# Patient Record
Sex: Female | Born: 1955
Health system: Southern US, Community
[De-identification: ages and names within clinical notes are randomized; demographics above are authoritative.]

## PROBLEM LIST (undated history)

## (undated) DIAGNOSIS — I209 Angina pectoris, unspecified: Secondary | ICD-10-CM

## (undated) DIAGNOSIS — G629 Polyneuropathy, unspecified: Secondary | ICD-10-CM

## (undated) DIAGNOSIS — I219 Acute myocardial infarction, unspecified: Secondary | ICD-10-CM

## (undated) DIAGNOSIS — E785 Hyperlipidemia, unspecified: Secondary | ICD-10-CM

## (undated) DIAGNOSIS — I5032 Chronic diastolic (congestive) heart failure: Secondary | ICD-10-CM

## (undated) DIAGNOSIS — D649 Anemia, unspecified: Secondary | ICD-10-CM

## (undated) DIAGNOSIS — Z972 Presence of dental prosthetic device (complete) (partial): Secondary | ICD-10-CM

## (undated) DIAGNOSIS — Z992 Dependence on renal dialysis: Secondary | ICD-10-CM

## (undated) DIAGNOSIS — E1140 Type 2 diabetes mellitus with diabetic neuropathy, unspecified: Secondary | ICD-10-CM

## (undated) DIAGNOSIS — E119 Type 2 diabetes mellitus without complications: Secondary | ICD-10-CM

## (undated) DIAGNOSIS — N289 Disorder of kidney and ureter, unspecified: Secondary | ICD-10-CM

## (undated) DIAGNOSIS — M1990 Unspecified osteoarthritis, unspecified site: Secondary | ICD-10-CM

## (undated) DIAGNOSIS — N186 End stage renal disease: Secondary | ICD-10-CM

## (undated) DIAGNOSIS — I2510 Atherosclerotic heart disease of native coronary artery without angina pectoris: Secondary | ICD-10-CM

## (undated) DIAGNOSIS — J90 Pleural effusion, not elsewhere classified: Secondary | ICD-10-CM

## (undated) DIAGNOSIS — I10 Essential (primary) hypertension: Secondary | ICD-10-CM

## (undated) DIAGNOSIS — K219 Gastro-esophageal reflux disease without esophagitis: Secondary | ICD-10-CM

## (undated) DIAGNOSIS — I272 Pulmonary hypertension, unspecified: Secondary | ICD-10-CM

## (undated) DIAGNOSIS — E039 Hypothyroidism, unspecified: Secondary | ICD-10-CM

## (undated) HISTORY — DX: End stage renal disease: N18.6

## (undated) HISTORY — DX: Type 2 diabetes mellitus with diabetic neuropathy, unspecified: E11.40

## (undated) HISTORY — DX: Anemia, unspecified: D64.9

## (undated) HISTORY — DX: Atherosclerotic heart disease of native coronary artery without angina pectoris: I25.10

## (undated) HISTORY — DX: Type 2 diabetes mellitus without complications: E11.9

## (undated) HISTORY — PX: CARDIAC CATHETERIZATION: SHX172

## (undated) HISTORY — DX: Essential (primary) hypertension: I10

## (undated) HISTORY — DX: Polyneuropathy, unspecified: G62.9

## (undated) HISTORY — DX: Chronic diastolic (congestive) heart failure: I50.32

## (undated) HISTORY — PX: CATARACT EXTRACTION: SUR2

## (undated) HISTORY — DX: Hyperlipidemia, unspecified: E78.5

## (undated) HISTORY — DX: Hypothyroidism, unspecified: E03.9

## (undated) HISTORY — PX: EYE SURGERY: SHX253

## (undated) SURGERY — Surgical Case
Anesthesia: *Unknown

---
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
## 2005-09-20 ENCOUNTER — Ambulatory Visit: Payer: Self-pay | Admitting: General Practice

## 2011-01-18 ENCOUNTER — Ambulatory Visit: Payer: Self-pay

## 2011-01-29 ENCOUNTER — Ambulatory Visit: Payer: Self-pay | Admitting: Ophthalmology

## 2011-03-05 ENCOUNTER — Ambulatory Visit: Payer: Self-pay | Admitting: Ophthalmology

## 2011-03-06 LAB — HM DIABETES EYE EXAM

## 2012-03-05 DIAGNOSIS — I219 Acute myocardial infarction, unspecified: Secondary | ICD-10-CM

## 2012-03-05 HISTORY — DX: Acute myocardial infarction, unspecified: I21.9

## 2012-04-25 ENCOUNTER — Encounter: Payer: Self-pay | Admitting: Adult Health

## 2012-04-25 ENCOUNTER — Ambulatory Visit (INDEPENDENT_AMBULATORY_CARE_PROVIDER_SITE_OTHER): Payer: BC Managed Care – PPO | Admitting: Adult Health

## 2012-04-25 VITALS — BP 140/90 | HR 110 | Temp 98.3°F | Resp 14 | Ht 63.0 in | Wt 136.0 lb

## 2012-04-25 DIAGNOSIS — I10 Essential (primary) hypertension: Secondary | ICD-10-CM

## 2012-04-25 DIAGNOSIS — G629 Polyneuropathy, unspecified: Secondary | ICD-10-CM

## 2012-04-25 DIAGNOSIS — Z1211 Encounter for screening for malignant neoplasm of colon: Secondary | ICD-10-CM

## 2012-04-25 DIAGNOSIS — Z0000 Encounter for general adult medical examination without abnormal findings: Secondary | ICD-10-CM

## 2012-04-25 DIAGNOSIS — E785 Hyperlipidemia, unspecified: Secondary | ICD-10-CM

## 2012-04-25 DIAGNOSIS — E119 Type 2 diabetes mellitus without complications: Secondary | ICD-10-CM

## 2012-04-25 DIAGNOSIS — E039 Hypothyroidism, unspecified: Secondary | ICD-10-CM

## 2012-04-25 DIAGNOSIS — Z23 Encounter for immunization: Secondary | ICD-10-CM

## 2012-04-25 DIAGNOSIS — G589 Mononeuropathy, unspecified: Secondary | ICD-10-CM

## 2012-04-25 DIAGNOSIS — Z1239 Encounter for other screening for malignant neoplasm of breast: Secondary | ICD-10-CM

## 2012-04-25 MED ORDER — ATENOLOL 25 MG PO TABS: 25.0000 mg | ORAL_TABLET | Freq: Every day | ORAL | Status: DC

## 2012-04-25 NOTE — Patient Instructions (Addendum)
  Thank you for choosing [facility] for your health care needs.  Please return for fasting labs on Monday.   I have restarted you on your blood pressure medication - Take Atenolol 25 mg daily.  Continue to take the aspirin.  We will call you for your appointment for your Mammography and for your Colonoscopy.  We will give you your tetanus vaccine today.  Call and schedule an appointment for your PAP smear.

## 2012-04-25 NOTE — Progress Notes (Signed)
  Subjective:    [name], female    DOB: Jun 13, 1955, 57 y.o.   MRN: [id number]  HPI  Patient is a very pleasant 57 y/o female who is here to establish care. She reports that she has not seen a provider in several years 2/2 loosing her insurance when she lost her job. She has a history of HTN, HLD, hypothyroidism, diabetes. She has not been able to afford any of her medications and, therefore, has not been taking them. Patient has lost 98 lbs since 2008. She cut back on her portions, eliminated many of the snacks she used to eat and began to walk. Patient is feeling well overall. She does report tingling in bilateral lower extremities and pain that mainly occurs at bedtime.  Meds:  ASA 81 mg daily  Health Maintenance:  Eye Exam - 2013, she is s/p bilateral cataract surgery paid for by a [facility] - needs  Colonoscopy - needs screening   Tdap - needs; administer today  Flu shot - does not want  PAP - 2008, normal. Needs to schedule  Dental - No insurance. Lower dentures, partial upper.  Diet - portion control, limits snacks, tries to eat healthy  Exercise - walking    Review of Systems  Constitutional: Negative.   HENT: Negative.   Eyes: Negative.   Respiratory: Negative.   Cardiovascular: Negative.   Gastrointestinal: Negative.   Genitourinary: Negative.   Musculoskeletal: Negative.   Neurological:       Tingling in lower extremities.  Psychiatric/Behavioral: Negative.     BP 140/90  Pulse 110  Temp(Src) 98.3 F (36.8 C) (Oral)  Resp 14  Ht 5\' 3"  (1.6 m)  Wt 136 lb (61.689 kg)  BMI 24.1 kg/m2  SpO2 98%     Objective:   Physical Exam  Constitutional: She is oriented to person, place, and time. She appears well-developed and well-nourished. No distress.  HENT:  Head: Normocephalic and atraumatic.  Right Ear: External ear normal.  Left Ear: External ear normal.  Mouth/Throat: Oropharynx is clear and moist.  Upper teeth poor  condition. She has a partial upper. Lower dentures.  Eyes: Conjunctivae and EOM are normal. Pupils are equal, round, and reactive to light.  Neck: Normal range of motion. Neck supple. No tracheal deviation present. No thyromegaly present.  Cardiovascular: Normal rate, regular rhythm and normal heart sounds.   No murmur heard. Pulmonary/Chest: Effort normal and breath sounds normal. No respiratory distress. She has no wheezes.  Abdominal: Soft. Bowel sounds are normal. She exhibits no mass. There is no tenderness.  Musculoskeletal: Normal range of motion. She exhibits no edema.  Lymphadenopathy:    She has no cervical adenopathy.  Neurological: She is alert and oriented to person, place, and time. No cranial nerve deficit. Coordination normal.  Skin: Skin is warm and dry.  Psychiatric: She has a normal mood and affect. Her behavior is normal. Judgment and thought content normal.          Assessment & Plan:

## 2012-04-27 ENCOUNTER — Encounter: Payer: Self-pay | Admitting: Adult Health

## 2012-04-27 DIAGNOSIS — Z1211 Encounter for screening for malignant neoplasm of colon: Secondary | ICD-10-CM | POA: Insufficient documentation

## 2012-04-27 DIAGNOSIS — E039 Hypothyroidism, unspecified: Secondary | ICD-10-CM | POA: Insufficient documentation

## 2012-04-27 DIAGNOSIS — E119 Type 2 diabetes mellitus without complications: Secondary | ICD-10-CM | POA: Insufficient documentation

## 2012-04-27 DIAGNOSIS — E1122 Type 2 diabetes mellitus with diabetic chronic kidney disease: Secondary | ICD-10-CM | POA: Insufficient documentation

## 2012-04-27 DIAGNOSIS — E785 Hyperlipidemia, unspecified: Secondary | ICD-10-CM | POA: Insufficient documentation

## 2012-04-27 DIAGNOSIS — I10 Essential (primary) hypertension: Secondary | ICD-10-CM | POA: Insufficient documentation

## 2012-04-27 DIAGNOSIS — G629 Polyneuropathy, unspecified: Secondary | ICD-10-CM | POA: Insufficient documentation

## 2012-04-27 DIAGNOSIS — Z23 Encounter for immunization: Secondary | ICD-10-CM | POA: Insufficient documentation

## 2012-04-27 DIAGNOSIS — Z1239 Encounter for other screening for malignant neoplasm of breast: Secondary | ICD-10-CM | POA: Insufficient documentation

## 2012-04-27 NOTE — Assessment & Plan Note (Signed)
Refer to Dr. Tiffany Kocher for screening colonoscopy.

## 2012-04-27 NOTE — Assessment & Plan Note (Signed)
Administer today

## 2012-04-27 NOTE — Assessment & Plan Note (Addendum)
Order mammogram °

## 2012-04-27 NOTE — Assessment & Plan Note (Signed)
Patient has not been on medication for her diabetes. She lost her job and her insurance so she was not able to afford it. She lost 98 lbs and I suspect that this may have positively affected her glucose control. Will check labs.

## 2012-04-27 NOTE — Assessment & Plan Note (Signed)
Hx of hyperlipidemia. Patient has lost a considerable amount of weight. Will check lipids.

## 2012-04-27 NOTE — Assessment & Plan Note (Signed)
Has been ordered synthroid in the past. She is currently not taking. Will check TSH

## 2012-04-27 NOTE — Assessment & Plan Note (Signed)
Patient with reports of tingling and pain in lower extremities mainly occuring at night. Hx of DM. Will check labs - hgbA1C, B12, TSH

## 2012-04-27 NOTE — Assessment & Plan Note (Signed)
Will restart her atenolol. Continue daily asa 81 mg. Patient has lost 98 lbs which, I suspect, has impacted her b/p readings positively.

## 2012-04-28 ENCOUNTER — Other Ambulatory Visit (INDEPENDENT_AMBULATORY_CARE_PROVIDER_SITE_OTHER): Payer: BC Managed Care – PPO

## 2012-04-28 ENCOUNTER — Other Ambulatory Visit: Payer: Self-pay | Admitting: Adult Health

## 2012-04-28 DIAGNOSIS — G629 Polyneuropathy, unspecified: Secondary | ICD-10-CM

## 2012-04-28 DIAGNOSIS — G589 Mononeuropathy, unspecified: Secondary | ICD-10-CM

## 2012-04-28 DIAGNOSIS — E119 Type 2 diabetes mellitus without complications: Secondary | ICD-10-CM

## 2012-04-28 DIAGNOSIS — E039 Hypothyroidism, unspecified: Secondary | ICD-10-CM

## 2012-04-28 DIAGNOSIS — Z0000 Encounter for general adult medical examination without abnormal findings: Secondary | ICD-10-CM

## 2012-04-28 LAB — COMPREHENSIVE METABOLIC PANEL
ALT: 16 U/L (ref 0–35)
AST: 14 U/L (ref 0–37)
Albumin: 3.7 g/dL (ref 3.5–5.2)
Alkaline Phosphatase: 40 U/L (ref 39–117)
BUN: 21 mg/dL (ref 6–23)
CO2: 28 mEq/L (ref 19–32)
Calcium: 9.7 mg/dL (ref 8.4–10.5)
Chloride: 100 mEq/L (ref 96–112)
Creatinine, Ser: 0.9 mg/dL (ref 0.4–1.2)
GFR: 66.55 mL/min (ref >60.00)
Glucose, Bld: 337 mg/dL — ABNORMAL HIGH (ref 70–99)
Potassium: 4.8 mEq/L (ref 3.5–5.1)
Sodium: 136 mEq/L (ref 135–145)
Total Bilirubin: 0.6 mg/dL (ref 0.3–1.2)
Total Protein: 6.9 g/dL (ref 6.0–8.3)

## 2012-04-28 LAB — LDL CHOLESTEROL, DIRECT: Direct LDL: 206.9 mg/dL

## 2012-04-28 LAB — CBC WITH DIFFERENTIAL/PLATELET
Basophils Absolute: 0 10*3/uL (ref 0.0–0.1)
Basophils Relative: 1 % (ref 0.0–3.0)
Eosinophils Absolute: 0.1 10*3/uL (ref 0.0–0.7)
Eosinophils Relative: 1.8 % (ref 0.0–5.0)
HCT: 39 % (ref 36.0–46.0)
Hemoglobin: 13.6 g/dL (ref 12.0–15.0)
Lymphocytes Relative: 33.5 % (ref 12.0–46.0)
Lymphs Abs: 1.6 10*3/uL (ref 0.7–4.0)
MCHC: 34.8 g/dL (ref 30.0–36.0)
MCV: 84.7 fl (ref 78.0–100.0)
Monocytes Absolute: 0.3 10*3/uL (ref 0.1–1.0)
Monocytes Relative: 5.7 % (ref 3.0–12.0)
Neutro Abs: 2.8 10*3/uL (ref 1.4–7.7)
Neutrophils Relative %: 58 % (ref 43.0–77.0)
Platelets: 229 10*3/uL (ref 150.0–400.0)
RBC: 4.6 Mil/uL (ref 3.87–5.11)
RDW: 12.9 % (ref 11.5–14.6)
WBC: 4.8 10*3/uL (ref 4.5–10.5)

## 2012-04-28 LAB — LIPID PANEL
Cholesterol: 330 mg/dL — ABNORMAL HIGH (ref 0–200)
HDL: 57.6 mg/dL (ref >39.00)
Total CHOL/HDL Ratio: 6
Triglycerides: 139 mg/dL (ref 0.0–149.0)
VLDL: 27.8 mg/dL (ref 0.0–40.0)

## 2012-04-28 LAB — HEMOGLOBIN A1C: Hgb A1c MFr Bld: 12.3 % — ABNORMAL HIGH (ref 4.6–6.5)

## 2012-04-28 LAB — TSH: TSH: 2.36 u[IU]/mL (ref 0.35–5.50)

## 2012-04-28 LAB — VITAMIN B12: Vitamin B-12: 604 pg/mL (ref 211–911)

## 2012-04-28 MED ORDER — METFORMIN HCL 500 MG PO TABS: 500.0000 mg | ORAL_TABLET | Freq: Two times a day (BID) | ORAL | Status: DC

## 2012-04-28 MED ORDER — SIMVASTATIN 10 MG PO TABS: 10.0000 mg | ORAL_TABLET | Freq: Every day | ORAL | Status: DC

## 2012-04-28 MED ORDER — INSULIN DETEMIR 100 UNIT/ML ~~LOC~~ SOLN: 10.0000 [IU] | Freq: Every day | SUBCUTANEOUS | Status: DC

## 2012-04-28 NOTE — Progress Notes (Signed)
  Start Levemir 10 units at bedtime (I have given you samples)  Start metformin 500 mg twice daily.  Check  blood sugar twice a day. Check it in the morning prior to eating anything and then check it again 1 hour after lunch or dinner.  Please keep a detailed record of your blood sugar so that I can make adjustments when I see you in 2 weeks.  Please return to clinic for follow up of diabetes in 2 weeks.

## 2012-04-29 LAB — MICROALBUMIN, URINE: Microalb, Ur: 169.84 mg/dL — ABNORMAL HIGH (ref 0.00–1.89)

## 2012-04-30 ENCOUNTER — Telehealth: Payer: Self-pay | Admitting: Adult Health

## 2012-04-30 ENCOUNTER — Other Ambulatory Visit: Payer: Self-pay | Admitting: Adult Health

## 2012-04-30 DIAGNOSIS — Z79899 Other long term (current) drug therapy: Secondary | ICD-10-CM

## 2012-04-30 MED ORDER — LISINOPRIL 10 MG PO TABS: 10.0000 mg | ORAL_TABLET | Freq: Every day | ORAL | Status: DC

## 2012-04-30 NOTE — Telephone Encounter (Signed)
Patient needing test strips and lancets called into the pharmacy.

## 2012-04-30 NOTE — Telephone Encounter (Signed)
Called patients husband regarding test strips and he is unsure of what brand his wife uses.  He will call back tomorrow.

## 2012-04-30 NOTE — Telephone Encounter (Signed)
Spouse called back wanting to get his wifes lancets and test strips called in to [facility] on [address] Please advise when these have been called it.  Mr Sherian Maroon number is [phone]   Ms shiu stated that his wife is completely out of these  Mr Georgia Lopes also stated he called yesterday and 3 times today trying to get this rx sent in

## 2012-05-01 ENCOUNTER — Telehealth: Payer: Self-pay | Admitting: Adult Health

## 2012-05-01 ENCOUNTER — Other Ambulatory Visit: Payer: Self-pay | Admitting: Adult Health

## 2012-05-01 NOTE — Telephone Encounter (Signed)
Patient has questions about her medication.

## 2012-05-01 NOTE — Telephone Encounter (Signed)
Jamie Arias, Please call in prescription for [facility] Freedom strips and lancets.  Let the pharmacist know that pt is checking her blood glucose 4 times daily and if they could please provide her with enough for a month with 4 refills.

## 2012-05-01 NOTE — Telephone Encounter (Signed)
Patient stated that she did not have a question about "one particular med" she took all her medications at once on a empty stomach and figured out that's why  she was feeling nauseated. She stated that she will not do that again.  Script left on [facility] vm

## 2012-05-01 NOTE — Telephone Encounter (Addendum)
Test strips and lancets called into pharmacy.

## 2012-05-02 ENCOUNTER — Telehealth: Payer: Self-pay | Admitting: Adult Health

## 2012-05-02 NOTE — Telephone Encounter (Signed)
Can the patient take a laxative and if so what kind of laxative?

## 2012-05-02 NOTE — Telephone Encounter (Signed)
Jamie Arias,  Are there any restrictions on Jamie Arias taking otc laxatives and to which ones I can recommend?    Jamie Arias

## 2012-05-02 NOTE — Telephone Encounter (Signed)
Patient's husband called and said that they would get the otc laxative.

## 2012-05-06 ENCOUNTER — Telehealth: Payer: Self-pay | Admitting: *Deleted

## 2012-05-06 ENCOUNTER — Telehealth: Payer: Self-pay | Admitting: Adult Health

## 2012-05-06 NOTE — Telephone Encounter (Signed)
Opened in error

## 2012-05-06 NOTE — Telephone Encounter (Signed)
Spoke with Mrs. Casaus and informed her that prior authorizations can sometimes be lengthy and suggested for her appointment with our NP this coming Friday that I would give one of our meters and ask [facility] to right a script for test strips that hopefully should be less expensive

## 2012-05-06 NOTE — Telephone Encounter (Signed)
Spouse states Jamie Arias has not been able to get her test strips from [facility].  [facility] says they faxed something over to [place] regarding the test strips; spouse thinks it had to do with insurance issue.  Jamie Arias has an appt on Friday with Raquel and is supposed to be able to give her report on sugars but pt is not able to test sugars due to not having test strips.  Pt is upset and frustrated.  Please advise.

## 2012-05-06 NOTE — Telephone Encounter (Signed)
Called [phone] for prior authorization for the Freestyle strips, form is being faxed over now

## 2012-05-09 ENCOUNTER — Ambulatory Visit: Payer: BC Managed Care – PPO | Admitting: Adult Health

## 2012-05-12 ENCOUNTER — Other Ambulatory Visit: Payer: Self-pay | Admitting: Adult Health

## 2012-05-12 ENCOUNTER — Telehealth: Payer: Self-pay | Admitting: *Deleted

## 2012-05-12 MED ORDER — GLUCOSE BLOOD VI STRP: ORAL_STRIP | Status: DC

## 2012-05-12 NOTE — Telephone Encounter (Signed)
Jamie Arias the patient's Contour test scripts can not be called in. They need to be on a hard copy with the dx code and how many times a day she should check it. Could you write it up and I will fax it to their pharmacist

## 2012-05-12 NOTE — Telephone Encounter (Signed)
Per [facility] patient spoke with Larene Beach and will be receiving new meter and samples of Levimer today. He will be here to pick up samples and glucometer

## 2012-05-12 NOTE — Telephone Encounter (Signed)
I have sent in a hard copy to pharmacy with diagnosis code.

## 2012-05-12 NOTE — Telephone Encounter (Signed)
Returned patients call regarding refill;msg.left

## 2012-05-14 ENCOUNTER — Other Ambulatory Visit: Payer: Self-pay | Admitting: Adult Health

## 2012-05-14 ENCOUNTER — Telehealth: Payer: Self-pay | Admitting: General Practice

## 2012-05-14 MED ORDER — TRAZODONE HCL 100 MG PO TABS: 100.0000 mg | ORAL_TABLET | Freq: Every day | ORAL | Status: DC

## 2012-05-14 NOTE — Telephone Encounter (Signed)
Pt husband called stating pt had been given a sample of insulin from [facility]. States that she is out and would like an Rx or more samples. Please advise.

## 2012-05-14 NOTE — Telephone Encounter (Signed)
Jamie Arias,  Disregard the previous message. I spoke with patient and she said she had the samples.  Thx, Eimy Plaza

## 2012-05-14 NOTE — Telephone Encounter (Signed)
We gave the patient 2 samples of Levemir the other day. Did they not pick it up? We had it in the refrigerator with her name on it??

## 2012-05-23 ENCOUNTER — Encounter: Payer: Self-pay | Admitting: Adult Health

## 2012-05-23 ENCOUNTER — Ambulatory Visit (INDEPENDENT_AMBULATORY_CARE_PROVIDER_SITE_OTHER): Payer: BC Managed Care – PPO | Admitting: Adult Health

## 2012-05-23 VITALS — BP 164/84 | HR 56 | Temp 98.0°F | Resp 14 | Ht 63.5 in | Wt 139.0 lb

## 2012-05-23 DIAGNOSIS — E119 Type 2 diabetes mellitus without complications: Secondary | ICD-10-CM

## 2012-05-23 DIAGNOSIS — I10 Essential (primary) hypertension: Secondary | ICD-10-CM

## 2012-05-23 DIAGNOSIS — G629 Polyneuropathy, unspecified: Secondary | ICD-10-CM

## 2012-05-23 DIAGNOSIS — G479 Sleep disorder, unspecified: Secondary | ICD-10-CM

## 2012-05-23 DIAGNOSIS — G589 Mononeuropathy, unspecified: Secondary | ICD-10-CM

## 2012-05-23 DIAGNOSIS — Z79899 Other long term (current) drug therapy: Secondary | ICD-10-CM

## 2012-05-23 LAB — CREATININE, SERUM: Creatinine, Ser: 1 mg/dL (ref 0.4–1.2)

## 2012-05-23 LAB — POTASSIUM: Potassium: 5.1 mEq/L (ref 3.5–5.1)

## 2012-05-23 MED ORDER — HYDROCHLOROTHIAZIDE 12.5 MG PO TABS: 12.5000 mg | ORAL_TABLET | Freq: Every day | ORAL | Status: DC

## 2012-05-23 MED ORDER — GABAPENTIN 300 MG PO CAPS: ORAL_CAPSULE | ORAL | Status: DC

## 2012-05-23 MED ORDER — ZOLPIDEM TARTRATE 5 MG PO TABS: 5.0000 mg | ORAL_TABLET | Freq: Every evening | ORAL | Status: DC | PRN

## 2012-05-23 MED ORDER — ATENOLOL 25 MG PO TABS: 25.0000 mg | ORAL_TABLET | Freq: Every day | ORAL | Status: DC

## 2012-05-23 MED ORDER — LISINOPRIL 10 MG PO TABS: 10.0000 mg | ORAL_TABLET | Freq: Every day | ORAL | Status: DC

## 2012-05-23 NOTE — Assessment & Plan Note (Signed)
Patient has been experiencing "pins and needles" feeling in her lower extremities. These are causing her significant discomfort and dark interfering with her sleep. Prior to establishing care in this office, patient had been off of her diabetic medications for extended period of time after she lost her job and insurance.  I suspect this is diabetic neuropathy. Start Neurontin 300 mg daily. We will increase gradually as needed. Return for followup in one month.

## 2012-05-23 NOTE — Patient Instructions (Addendum)
  You are doing well. Continue to good work.  I have ordered neurontin for your "pins & needles" pain in your feet. Take this at bedtime.  Take the ambien at bedtime to help you sleep.  When you pick up your medication at [facility], please verify that it is the correct medications and dosage.

## 2012-05-23 NOTE — Assessment & Plan Note (Signed)
During last clinic visit, patient's blood sugar was greater than 300. Patient reports that her blood sugars are running less than 150. She has a record of it each reading, however, she forgot to bring them in. She reports that she is feeling so much better since she started back on medication to control her blood sugar. She is hoping to be able to regulate her blood sugar with oral medication and eventually stop the Levemir. Patient is experiencing neuropathic pain in bilateral lower extremities. I had checked her B12 levels during last visit and these were normal.

## 2012-05-23 NOTE — Assessment & Plan Note (Signed)
Patient's blood pressure is slightly elevated during visit today. In reviewing her medications, patient does not recall being on hydrochlorothiazide and has not been taking this. I have provided her a list of her blood pressure medications including atenolol, lisinopril, hydrochlorothiazide. I have asked her to return for followup in one month for recheck of her blood pressure.

## 2012-05-23 NOTE — Assessment & Plan Note (Signed)
Patient has been taking trazodone but she reports that this is not helping her stay asleep. Start Ambien 5 mg at bedtime when necessary

## 2012-05-23 NOTE — Progress Notes (Signed)
  Subjective:    [name], female    DOB: [birth year], 57 y.o.   MRN: [id number]  HPI  Patient is a very pleasant 57 y/o female who is here for f/u of her HTN and diabetes. She also reports continued tingling in bilateral lower extremities and pain "that feel like needles pricking me". Patient reports feeling much better since starting back on her medications. She is checking her blood sugars 4 times daily and keeping a record. She unfortunately has forgotten to bring her blood sugar readings with her. Patient also reports that she is having trouble sleeping. She states that she is currently taking trazodone but finds that she awakens around 3 AM and is unable to go back to sleep.   Current Outpatient Prescriptions on File Prior to Visit  Medication Sig Dispense Refill  . BAYER ASPIRIN EC LOW DOSE PO Take 81 mg by mouth daily.      Marland Kitchen glucose blood test strip Please dispense Contour next EZ test strips. She is testing 4 times a day. Diagnosis code 250.0  120 each  12  . insulin detemir (LEVEMIR) 100 UNIT/ML injection Inject 10 Units into the skin at bedtime.  10 mL  12  . levothyroxine (SYNTHROID, LEVOTHROID) 50 MCG tablet Take 50 mcg by mouth daily.      . metFORMIN (GLUCOPHAGE) 500 MG tablet Take 1 tablet (500 mg total) by mouth 2 (two) times daily with a meal.  60 tablet  3  . simvastatin (ZOCOR) 10 MG tablet Take 1 tablet (10 mg total) by mouth at bedtime.  30 tablet  3   No current facility-administered medications on file prior to visit.     Review of Systems  Constitutional: Negative for fever, chills, appetite change and fatigue.  HENT: Negative.   Eyes: Negative.   Respiratory: Negative.   Cardiovascular: Negative.   Endocrine: Negative.   Genitourinary: Negative.   Musculoskeletal: Negative.   Skin: Negative.   Allergic/Immunologic: Negative.   Neurological: Positive for numbness. Negative for dizziness, light-headedness and headaches.       Tingling in  bilateral LE  Hematological: Negative.   Psychiatric/Behavioral: Negative.     BP 164/84  Pulse 56  Temp(Src) 98 F (36.7 C) (Oral)  Resp 14  Ht 5' 3.5" (1.613 m)  Wt 139 lb (63.05 kg)  BMI 24.23 kg/m2  SpO2 100%     Objective:   Physical Exam  Constitutional: She is oriented to person, place, and time. She appears well-developed and well-nourished. No distress.  HENT:  Head: Normocephalic and atraumatic.  Cardiovascular: Normal rate, regular rhythm, normal heart sounds and intact distal pulses.  Exam reveals no gallop.   No murmur heard. Pulmonary/Chest: Effort normal and breath sounds normal. No respiratory distress. She has no wheezes. She has no rales.  Abdominal: Soft. Bowel sounds are normal.  Musculoskeletal: She exhibits no edema.  Neurological: She is alert and oriented to person, place, and time.  Skin: Skin is warm and dry.  Psychiatric: She has a normal mood and affect. Her behavior is normal. Judgment and thought content normal.       Assessment & Plan:

## 2012-06-09 ENCOUNTER — Telehealth: Payer: Self-pay | Admitting: Adult Health

## 2012-06-09 NOTE — Telephone Encounter (Signed)
insulin detemir (LEVEMIR) 100 UNIT/ML injection

## 2012-06-11 NOTE — Telephone Encounter (Signed)
Appt made for pt on Friday. And Levemir samples in the fridge for pt.

## 2012-06-13 ENCOUNTER — Ambulatory Visit: Payer: BC Managed Care – PPO | Admitting: Adult Health

## 2012-06-16 ENCOUNTER — Telehealth: Payer: Self-pay | Admitting: Adult Health

## 2012-06-16 NOTE — Telephone Encounter (Signed)
Please schedule appt for patient to be evaluated.

## 2012-06-16 NOTE — Telephone Encounter (Signed)
Patient has some concerns about her medication they are making her nauseated . Please call the patient after 5:00.

## 2012-06-17 NOTE — Telephone Encounter (Signed)
Called patient, husband said pt. Only available after 5:00, can maybe reach her on her cell phone after 12:00. Will CB

## 2012-06-18 NOTE — Telephone Encounter (Signed)
Called and left VM for pt to call and schedule an appointment to discuss medications.

## 2012-06-25 ENCOUNTER — Telehealth: Payer: Self-pay | Admitting: *Deleted

## 2012-06-25 NOTE — Telephone Encounter (Signed)
Phone call from pt's husband, OV scheduled 07/04/12, requesting sample of Levemir. Sample left for pick up.

## 2012-07-04 ENCOUNTER — Encounter: Payer: Self-pay | Admitting: Adult Health

## 2012-07-04 ENCOUNTER — Ambulatory Visit (INDEPENDENT_AMBULATORY_CARE_PROVIDER_SITE_OTHER): Payer: BC Managed Care – PPO | Admitting: Adult Health

## 2012-07-04 VITALS — BP 156/68 | HR 70 | Temp 98.3°F | Resp 12 | Wt 141.0 lb

## 2012-07-04 DIAGNOSIS — E119 Type 2 diabetes mellitus without complications: Secondary | ICD-10-CM

## 2012-07-04 DIAGNOSIS — R899 Unspecified abnormal finding in specimens from other organs, systems and tissues: Secondary | ICD-10-CM

## 2012-07-04 DIAGNOSIS — I10 Essential (primary) hypertension: Secondary | ICD-10-CM

## 2012-07-04 DIAGNOSIS — G589 Mononeuropathy, unspecified: Secondary | ICD-10-CM

## 2012-07-04 DIAGNOSIS — R6889 Other general symptoms and signs: Secondary | ICD-10-CM

## 2012-07-04 DIAGNOSIS — G629 Polyneuropathy, unspecified: Secondary | ICD-10-CM

## 2012-07-04 DIAGNOSIS — E785 Hyperlipidemia, unspecified: Secondary | ICD-10-CM

## 2012-07-04 MED ORDER — INSULIN PEN NEEDLE 32G X 6 MM MISC: Status: DC

## 2012-07-04 NOTE — Progress Notes (Signed)
Subjective:    [name], female    DOB: Jul 30, 1955, 57 y.o.   MRN: [id number]  HPI  It is a pleasant 57 year old female with history of hypertension, hyperlipidemia, diabetes type 2 with neuropathy, hypothyroidism who presents to clinic for follow up of diabetes, hypertension and neuropathy.  1) Diabetes - patient reports that her a.m. blood sugars are running in the low 100. This morning her blood sugar was 98. She is taking metformin 500 mg twice a day and she is on Levemir 10 units at bedtime. She denies any episodes of hypoglycemia. Patient is currently checking her blood sugars twice a day. She reports checking it first thing in the morning and then one hour after meals rather than before meals. Her p.m. blood glucose is running anywhere between 140 and 160.  2) hypertension - patient has not been monitoring this at home as she does not have a blood pressure cuff. Blood pressure during visit is 156/68. She is currently on atenolol, lisinopril and hydrochlorothiazide.  3) neuropathy - patient feels that the gabapentin has been helping. The feelings of "pins and needles" in lower extremities is somewhat improved.   Current Outpatient Prescriptions on File Prior to Visit  Medication Sig Dispense Refill  . atenolol (TENORMIN) 25 MG tablet Take 1 tablet (25 mg total) by mouth daily.  30 tablet  6  . BAYER ASPIRIN EC LOW DOSE PO Take 81 mg by mouth daily.      Marland Kitchen gabapentin (NEURONTIN) 300 MG capsule Take 1 capsule in the evening.  60 capsule  2  . glucose blood test strip Please dispense Contour next EZ test strips. She is testing 4 times a day. Diagnosis code 250.0  120 each  12  . hydrochlorothiazide (HYDRODIURIL) 12.5 MG tablet Take 1 tablet (12.5 mg total) by mouth daily.  90 tablet  3  . insulin detemir (LEVEMIR) 100 UNIT/ML injection Inject 10 Units into the skin at bedtime.  10 mL  12  . levothyroxine (SYNTHROID, LEVOTHROID) 50 MCG tablet Take 50 mcg by mouth daily.       Marland Kitchen lisinopril (PRINIVIL,ZESTRIL) 10 MG tablet Take 1 tablet (10 mg total) by mouth daily.  30 tablet  3  . metFORMIN (GLUCOPHAGE) 500 MG tablet Take 1 tablet (500 mg total) by mouth 2 (two) times daily with a meal.  60 tablet  3  . simvastatin (ZOCOR) 10 MG tablet Take 1 tablet (10 mg total) by mouth at bedtime.  30 tablet  3  . zolpidem (AMBIEN) 5 MG tablet Take 1 tablet (5 mg total) by mouth at bedtime as needed for sleep.  15 tablet  1   No current facility-administered medications on file prior to visit.    Past Medical History  Diagnosis Date  . Diabetes mellitus without complication   . Hypertension   . Hyperlipidemia   . Hypothyroidism   . Diabetic neuropathy     Past Surgical History  Procedure Laterality Date  . Eye surgery      Cataract both eyes    Family History  Problem Relation Age of Onset  . COPD Mother   . Cancer Mother     Lung  . Pulmonary embolism Father     History   Social History  . Marital Status: Married    Spouse Name: N/A    Number of Children: N/A  . Years of Education: N/A   Occupational History  . Not on file.   Social History Main Topics  .  Smoking status: Never Smoker   . Smokeless tobacco: Never Used  . Alcohol Use: No  . Drug Use: No  . Sexually Active: Not on file   Other Topics Concern  . Not on file   Social History Narrative   Patient lives at home with her husband. Patient has 4 adult children.    Review of Systems  Constitutional: Negative.   HENT: Negative.   Eyes: Negative.   Respiratory: Negative.   Cardiovascular: Negative.   Gastrointestinal: Positive for nausea.       Occasional nausea when taking metformin. Patient reports that this does not happen daily.  Endocrine: Negative.   Genitourinary: Negative.   Allergic/Immunologic: Negative.   Neurological: Negative for dizziness, tremors, syncope and weakness.       Numbness and tingling bilateral lower extremities but these have improved   Psychiatric/Behavioral: Positive for sleep disturbance. Negative for behavioral problems, confusion, decreased concentration and agitation. The patient is not nervous/anxious.        Sleeping better with Ambien. Reports some stressors related to personal and work issues. She finds reading a distraction and does this often.   BP 156/68  Pulse 70  Temp(Src) 98.3 F (36.8 C)  Resp 12  Wt 141 lb (63.957 kg)  BMI 24.58 kg/m2  SpO2 97%    Objective:   Physical Exam  Constitutional: She is oriented to person, place, and time.  Pleasant female in no apparent distress.  HENT:  Head: Normocephalic and atraumatic.  Cardiovascular: Normal rate, regular rhythm, normal heart sounds and intact distal pulses.  Exam reveals no gallop and no friction rub.   No murmur heard. Pulmonary/Chest: Effort normal and breath sounds normal. No respiratory distress. She has no wheezes. She has no rales. She exhibits no tenderness.  Abdominal: Soft. Bowel sounds are normal.  Neurological: She is alert and oriented to person, place, and time.  Psychiatric: She has a normal mood and affect. Her behavior is normal. Judgment and thought content normal.          Assessment & Plan:

## 2012-07-04 NOTE — Patient Instructions (Addendum)
  You are doing well. I am pleased with your blood sugar numbers.  Return for labs on 07/25/12. You will need to be fasting.  Call to schedule your mammogram and also your preliminary appointment for your colonoscopy.  I will need to see you for a physical including your PAP

## 2012-07-04 NOTE — Assessment & Plan Note (Signed)
Started Neurontin 300 mg daily. This seems to be helping with her symptoms. No change in dose at this time. Continue to monitor.

## 2012-07-04 NOTE — Assessment & Plan Note (Signed)
Blood pressure improved since last visit. Last visit blood pressure was 164/84. Today it is 156/68. There may still be element of white coat syndrome. Patient had not been taking her hydrochlorothiazide when I saw her last visit. She is now which reflects in her better readings. She has no way of checking her blood pressure at home as she does not have a blood pressure cuff. Would like to get systolic below [id number]. Continue to follow.

## 2012-07-04 NOTE — Assessment & Plan Note (Addendum)
Blood sugars have greatly improved on metformin and Levemir. Her a.m. readings are between 98-118. She had been checking her blood sugar one hour after meals in the evening and they have been running between 140 and 163. I have instructed her to test her blood sugar prior to meals. Repeat hemoglobin A1c at the end of May. Consider switching to oral hypoglycemic agents if A1c improved. Patient has appointment May 23 for repeat lab work.

## 2012-07-16 ENCOUNTER — Encounter: Payer: Self-pay | Admitting: Adult Health

## 2012-07-16 ENCOUNTER — Ambulatory Visit (INDEPENDENT_AMBULATORY_CARE_PROVIDER_SITE_OTHER): Payer: BC Managed Care – PPO | Admitting: Adult Health

## 2012-07-16 VITALS — BP 106/58 | HR 56 | Temp 98.2°F | Resp 16 | Wt 137.0 lb

## 2012-07-16 DIAGNOSIS — R55 Syncope and collapse: Secondary | ICD-10-CM | POA: Insufficient documentation

## 2012-07-16 DIAGNOSIS — R42 Dizziness and giddiness: Secondary | ICD-10-CM

## 2012-07-16 DIAGNOSIS — G479 Sleep disorder, unspecified: Secondary | ICD-10-CM

## 2012-07-16 LAB — CBC WITH DIFFERENTIAL/PLATELET
Basophils Absolute: 0.1 10*3/uL (ref 0.0–0.1)
Basophils Relative: 0.9 % (ref 0.0–3.0)
Eosinophils Absolute: 0.1 10*3/uL (ref 0.0–0.7)
Eosinophils Relative: 0.9 % (ref 0.0–5.0)
HCT: 36.5 % (ref 36.0–46.0)
Hemoglobin: 12.6 g/dL (ref 12.0–15.0)
Lymphocytes Relative: 37.7 % (ref 12.0–46.0)
Lymphs Abs: 2.3 10*3/uL (ref 0.7–4.0)
MCHC: 34.5 g/dL (ref 30.0–36.0)
MCV: 86.7 fl (ref 78.0–100.0)
Monocytes Absolute: 0.3 10*3/uL (ref 0.1–1.0)
Monocytes Relative: 5.1 % (ref 3.0–12.0)
Neutro Abs: 3.4 10*3/uL (ref 1.4–7.7)
Neutrophils Relative %: 55.4 % (ref 43.0–77.0)
Platelets: 225 10*3/uL (ref 150.0–400.0)
RBC: 4.21 Mil/uL (ref 3.87–5.11)
RDW: 13.8 % (ref 11.5–14.6)
WBC: 6.2 10*3/uL (ref 4.5–10.5)

## 2012-07-16 LAB — BASIC METABOLIC PANEL
BUN: 24 mg/dL — ABNORMAL HIGH (ref 6–23)
CO2: 28 mEq/L (ref 19–32)
Calcium: 9.9 mg/dL (ref 8.4–10.5)
Chloride: 105 mEq/L (ref 96–112)
Creatinine, Ser: 0.9 mg/dL (ref 0.4–1.2)
GFR: 70 mL/min (ref >60.00)
Glucose, Bld: 174 mg/dL — ABNORMAL HIGH (ref 70–99)
Potassium: 5.2 mEq/L — ABNORMAL HIGH (ref 3.5–5.1)
Sodium: 140 mEq/L (ref 135–145)

## 2012-07-16 MED ORDER — TRAZODONE HCL 100 MG PO TABS: 100.0000 mg | ORAL_TABLET | Freq: Every day | ORAL | Status: DC

## 2012-07-16 NOTE — Progress Notes (Signed)
Subjective:    [name], female    DOB: April 15, 1955, 57 y.o.   MRN: [id number]  HPI  He should is a pleasant 57 year old female with history of hypertension, diabetes with neuropathy, hypothyroidism who presents to clinic with report of episodes of feeling like she is going to pass out. Episodes have mainly occurred during work. She works in a [employer] where she is standing for 9 hours. She reports that it is often very hot inside the facility. Patient reports that she begins to feel lightheaded, sick and feeling like she is going to faint. Symptoms have not occurred to her while sitting. She has not been able to check her blood sugar during working hours. She has not checked her pulse during these episodes. A.m. blood sugars have been running around 115. She denies chest pain or shortness of breath during episodes. Patient is on Levemir at bedtime and she is also on metformin. She reports that she is not eating regularly. She is skipping some meals.   Current Outpatient Prescriptions on File Prior to Visit  Medication Sig Dispense Refill  . atenolol (TENORMIN) 25 MG tablet Take 1 tablet (25 mg total) by mouth daily.  30 tablet  6  . BAYER ASPIRIN EC LOW DOSE PO Take 81 mg by mouth daily.      Marland Kitchen gabapentin (NEURONTIN) 300 MG capsule Take 1 capsule in the evening.  60 capsule  2  . glucose blood test strip Please dispense Contour next EZ test strips. She is testing 4 times a day. Diagnosis code 250.0  120 each  12  . hydrochlorothiazide (HYDRODIURIL) 12.5 MG tablet Take 1 tablet (12.5 mg total) by mouth daily.  90 tablet  3  . insulin detemir (LEVEMIR) 100 UNIT/ML injection Inject 10 Units into the skin at bedtime.  10 mL  12  . Insulin Pen Needle 32G X 6 MM MISC Dispense 1 box of 50 insulin pen needles.  50 each  0  . levothyroxine (SYNTHROID, LEVOTHROID) 50 MCG tablet Take 50 mcg by mouth daily.      Marland Kitchen lisinopril (PRINIVIL,ZESTRIL) 10 MG tablet Take 1 tablet (10 mg total) by mouth  daily.  30 tablet  3  . metFORMIN (GLUCOPHAGE) 500 MG tablet Take 1 tablet (500 mg total) by mouth 2 (two) times daily with a meal.  60 tablet  3  . simvastatin (ZOCOR) 10 MG tablet Take 1 tablet (10 mg total) by mouth at bedtime.  30 tablet  3   No current facility-administered medications on file prior to visit.    Review of Systems  Constitutional: Negative for fever, chills and fatigue.  Respiratory: Negative for cough, chest tightness, shortness of breath and wheezing.   Cardiovascular: Negative for chest pain and leg swelling.       Bradycardia heart rate 56. Patient is on atenolol 25 mg  Neurological: Positive for syncope and light-headedness. Negative for dizziness, tremors, weakness, numbness and headaches.  Psychiatric/Behavioral: Positive for sleep disturbance.       Ambien is not working     BP 106/58  Pulse 56  Temp(Src) 98.2 F (36.8 C) (Oral)  Resp 16  Wt 137 lb (62.143 kg)  BMI 23.88 kg/m2  SpO2 99%    Objective:   Physical Exam  Constitutional: She is oriented to person, place, and time. She appears well-developed and well-nourished. No distress.  Cardiovascular: Regular rhythm, normal heart sounds and intact distal pulses.   Bradycardia, heart rate 56  Pulmonary/Chest: Effort normal.  No respiratory distress.  Musculoskeletal: She exhibits no edema.  Neurological: She is alert and oriented to person, place, and time. No cranial nerve deficit. Coordination normal.  Skin: Skin is warm and dry.  Psychiatric: She has a normal mood and affect. Her behavior is normal. Judgment and thought content normal.          Assessment & Plan:

## 2012-07-16 NOTE — Patient Instructions (Addendum)
  You need to eat every 2-3 hours. You are taking insulin which will cause your blood sugar to drop.  Drink fluids (water, diet drinks) throughout the day to stay hydrated especially in very hot weather.  Please check your blood sugar 4 times daily, before meals and at bedtime for the next 1-2 weeks.  Check your blood sugar and your pulse if any of these events occur again.  Follow up in 1-2 weeks.

## 2012-07-16 NOTE — Assessment & Plan Note (Addendum)
Given patient's report, suspect that these episodes are related to hypoglycemia. Patient has been skipping meals and going extended periods of time without food. Explained need for meals/snacks every 2-3 hours. I have provided Jamie Arias with a diabetic meal plan. Had a long discussion with patient regarding the possibility that symptoms could be attributed to different issues. She was comfortable with the first trying to evaluate if eating more regularly would improve Jamie Arias symptoms. EKG was done and was normal except for bradycardia. Patient is on atenolol. Jamie Arias symptoms may also be coming from this. She is going to monitor Jamie Arias pulse when and if these episodes reappear. Check CBC and metabolic panel today. Followup in one to 2 weeks. If patient is still having symptoms will continue further workup. Note, greater than 30 minutes were spent in face-to-face communication with patient in the assessment and development of a plan of care.

## 2012-07-16 NOTE — Assessment & Plan Note (Signed)
DC Ambien. Restart trazodone 100 mg at bedtime

## 2012-07-21 ENCOUNTER — Ambulatory Visit (INDEPENDENT_AMBULATORY_CARE_PROVIDER_SITE_OTHER): Payer: BC Managed Care – PPO | Admitting: Adult Health

## 2012-07-21 ENCOUNTER — Encounter: Payer: Self-pay | Admitting: Adult Health

## 2012-07-21 VITALS — BP 110/66 | HR 72 | Resp 12 | Wt 133.0 lb

## 2012-07-21 DIAGNOSIS — E875 Hyperkalemia: Secondary | ICD-10-CM

## 2012-07-21 DIAGNOSIS — R55 Syncope and collapse: Secondary | ICD-10-CM

## 2012-07-21 DIAGNOSIS — E119 Type 2 diabetes mellitus without complications: Secondary | ICD-10-CM

## 2012-07-21 DIAGNOSIS — E860 Dehydration: Secondary | ICD-10-CM

## 2012-07-21 NOTE — Patient Instructions (Addendum)
  Stop the atenolol. This may be lowering your pulse.  Drink water - at least 3 bottles (16 oz) daily.  Please have blood work drawn prior to leaving the office.  Return on Friday for fasting blood work.  I am referring you to Cardiology.  I will also refer you for Diabetic education.

## 2012-07-21 NOTE — Progress Notes (Signed)
  Subjective:    [name], female    DOB: [birth year], 57 y.o.   MRN: [id number]  HPI  Patient presents to clinic with continued feelings of weakness and that she is going to pass out. She was seen in clinic a week ago with the same symptoms. She has been monitoring her BG and it has been between 130s and 140s. No episodes of hypoglycemia. She has also been checking her pulse and it has been high 50s and low 60s. Patient is on atenolol 25 mg daily. She denies chest pain or shortness of breath.    Current Outpatient Prescriptions on File Prior to Visit  Medication Sig Dispense Refill  . BAYER ASPIRIN EC LOW DOSE PO Take 81 mg by mouth daily.      Marland Kitchen gabapentin (NEURONTIN) 300 MG capsule Take 1 capsule in the evening.  60 capsule  2  . glucose blood test strip Please dispense Contour next EZ test strips. She is testing 4 times a day. Diagnosis code 250.0  120 each  12  . hydrochlorothiazide (HYDRODIURIL) 12.5 MG tablet Take 1 tablet (12.5 mg total) by mouth daily.  90 tablet  3  . insulin detemir (LEVEMIR) 100 UNIT/ML injection Inject 10 Units into the skin at bedtime.  10 mL  12  . Insulin Pen Needle 32G X 6 MM MISC Dispense 1 box of 50 insulin pen needles.  50 each  0  . levothyroxine (SYNTHROID, LEVOTHROID) 50 MCG tablet Take 50 mcg by mouth daily.      Marland Kitchen lisinopril (PRINIVIL,ZESTRIL) 10 MG tablet Take 1 tablet (10 mg total) by mouth daily.  30 tablet  3  . metFORMIN (GLUCOPHAGE) 500 MG tablet Take 1 tablet (500 mg total) by mouth 2 (two) times daily with a meal.  60 tablet  3  . simvastatin (ZOCOR) 10 MG tablet Take 1 tablet (10 mg total) by mouth at bedtime.  30 tablet  3  . traZODone (DESYREL) 100 MG tablet Take 1 tablet (100 mg total) by mouth at bedtime.  30 tablet  6   No current facility-administered medications on file prior to visit.    Review of Systems  Neurological: Positive for weakness and light-headedness. Negative for dizziness.       Near syncope   BP  110/66  Pulse 72  Resp 12  Wt 133 lb (60.328 kg)  BMI 23.19 kg/m2  SpO2 98%    Objective:   Physical Exam  Constitutional: She is oriented to person, place, and time. She appears well-developed and well-nourished. No distress.  Cardiovascular: Normal rate, regular rhythm and normal heart sounds.  Exam reveals no gallop and no friction rub.   No murmur heard. Pulmonary/Chest: Effort normal and breath sounds normal. No respiratory distress. She has no wheezes. She has no rales.  Musculoskeletal: She exhibits no edema.  Neurological: She is alert and oriented to person, place, and time.  Skin: Skin is warm and dry.  Psychiatric: She has a normal mood and affect. Her behavior is normal. Judgment and thought content normal.       Assessment & Plan:

## 2012-07-21 NOTE — Assessment & Plan Note (Addendum)
Continues with symptoms of weakness and near syncope. Stop atenolol as I suspect her HR may be dropping and causing symptoms. Recheck potassium, BUN/Cr. Recently check TSH and was wnl. Patient needs to drink fluids. Refer to cardiology.

## 2012-07-22 ENCOUNTER — Encounter: Payer: Self-pay | Admitting: Emergency Medicine

## 2012-07-22 LAB — CREATININE, SERUM: Creatinine, Ser: 0.9 mg/dL (ref 0.4–1.2)

## 2012-07-22 LAB — POTASSIUM: Potassium: 4.8 mEq/L (ref 3.5–5.1)

## 2012-07-22 LAB — BUN: BUN: 26 mg/dL — ABNORMAL HIGH (ref 6–23)

## 2012-07-25 ENCOUNTER — Encounter: Payer: Self-pay | Admitting: Adult Health

## 2012-07-25 ENCOUNTER — Ambulatory Visit (INDEPENDENT_AMBULATORY_CARE_PROVIDER_SITE_OTHER): Payer: BC Managed Care – PPO | Admitting: Adult Health

## 2012-07-25 DIAGNOSIS — E785 Hyperlipidemia, unspecified: Secondary | ICD-10-CM

## 2012-07-25 DIAGNOSIS — R6889 Other general symptoms and signs: Secondary | ICD-10-CM

## 2012-07-25 DIAGNOSIS — R899 Unspecified abnormal finding in specimens from other organs, systems and tissues: Secondary | ICD-10-CM

## 2012-07-25 DIAGNOSIS — E119 Type 2 diabetes mellitus without complications: Secondary | ICD-10-CM

## 2012-07-25 LAB — COMPREHENSIVE METABOLIC PANEL
ALT: 13 U/L (ref 0–35)
AST: 14 U/L (ref 0–37)
Albumin: 3.9 g/dL (ref 3.5–5.2)
Alkaline Phosphatase: 34 U/L — ABNORMAL LOW (ref 39–117)
BUN: 23 mg/dL (ref 6–23)
CO2: 27 mEq/L (ref 19–32)
Calcium: 9.8 mg/dL (ref 8.4–10.5)
Chloride: 100 mEq/L (ref 96–112)
Creatinine, Ser: 0.9 mg/dL (ref 0.4–1.2)
GFR: 68.2 mL/min (ref >60.00)
Glucose, Bld: 169 mg/dL — ABNORMAL HIGH (ref 70–99)
Potassium: 4.3 mEq/L (ref 3.5–5.1)
Sodium: 136 mEq/L (ref 135–145)
Total Bilirubin: 0.6 mg/dL (ref 0.3–1.2)
Total Protein: 6.9 g/dL (ref 6.0–8.3)

## 2012-07-25 LAB — LIPID PANEL
Cholesterol: 268 mg/dL — ABNORMAL HIGH (ref 0–200)
HDL: 59.2 mg/dL (ref >39.00)
Total CHOL/HDL Ratio: 5
Triglycerides: 115 mg/dL (ref 0.0–149.0)
VLDL: 23 mg/dL (ref 0.0–40.0)

## 2012-07-25 LAB — HEMOGLOBIN A1C: Hgb A1c MFr Bld: 8.3 % — ABNORMAL HIGH (ref 4.6–6.5)

## 2012-07-25 LAB — LDL CHOLESTEROL, DIRECT: Direct LDL: 168.1 mg/dL

## 2012-07-26 LAB — MICROALBUMIN, URINE: Microalb, Ur: 41.58 mg/dL — ABNORMAL HIGH (ref 0.00–1.89)

## 2012-07-29 ENCOUNTER — Telehealth: Payer: Self-pay | Admitting: *Deleted

## 2012-07-29 NOTE — Progress Notes (Signed)
[name], female   DOB: 11/03/1955, 57 y.o.   MRN: [id number]  Lab appointment only

## 2012-07-29 NOTE — Telephone Encounter (Signed)
Pt called stating she had 2 episodes of the near syncope over the weekend, one of which occurred in the middle of the night when she got up. Pt states she gets up slowly to avoid dizziness. Heart rate was in the 60s with each episode.  Fasting blood glucose levels have been in the 150s and 160s. Pt states she is afraid to go to work for fear she will pass out on the job. Spoke with Gabriel Cirri at [place]  to get an earlier appointment than 08/11/12, but no availability, was placed on wait list. Pt would like to see Raquel again this week to be evaluated. Pt states she stopped her Atenolol as directed and has increased her fluid intake. Appt scheduled 07/31/12. Advised to call back with a worsening or change in symptoms.  Pt verbalized understanding.

## 2012-07-31 ENCOUNTER — Ambulatory Visit: Payer: BC Managed Care – PPO | Admitting: Adult Health

## 2012-08-01 ENCOUNTER — Ambulatory Visit (INDEPENDENT_AMBULATORY_CARE_PROVIDER_SITE_OTHER): Payer: BC Managed Care – PPO | Admitting: Adult Health

## 2012-08-01 ENCOUNTER — Encounter: Payer: Self-pay | Admitting: Adult Health

## 2012-08-01 VITALS — BP 108/68 | HR 88 | Resp 12 | Wt 136.0 lb

## 2012-08-01 DIAGNOSIS — E785 Hyperlipidemia, unspecified: Secondary | ICD-10-CM

## 2012-08-01 DIAGNOSIS — E119 Type 2 diabetes mellitus without complications: Secondary | ICD-10-CM

## 2012-08-01 DIAGNOSIS — R55 Syncope and collapse: Secondary | ICD-10-CM

## 2012-08-01 NOTE — Progress Notes (Signed)
Subjective:    [name], female    DOB: 11-21-1955, 57 y.o.   MRN: [id number]  HPI  Patient is a pleasant 57 y/o female with history of diabetes with neuropathy, hypertension, hyperlipidemia, hypothyroidism who presents for f/u near syncopal episodes and review of her recent lab work. Atenolol was recently discontinued as it was suspected that her near  syncopal episodes may have been precipitated by a dropping of her HR. She was seen 1 week ago and reported feeling much better; however, pt had another episode earlier this week. She reports she was at her grandson's ball game and it was very hot. She feels this may have caused her most recent episode. She denied chest pain or shortness of breath. Patient reports that her blood sugar was in the 140s one episode occurred. As soon as she cooled off and had something to drink the episode subsided. Today she reports feeling much better. No other episodes. Patient has appointment with cardiologist in approximately one week.   Current Outpatient Prescriptions on File Prior to Visit  Medication Sig Dispense Refill  . BAYER ASPIRIN EC LOW DOSE PO Take 81 mg by mouth daily.      Marland Kitchen gabapentin (NEURONTIN) 300 MG capsule Take 1 capsule in the evening.  60 capsule  2  . glucose blood test strip Please dispense Contour next EZ test strips. She is testing 4 times a day. Diagnosis code 250.0  120 each  12  . hydrochlorothiazide (HYDRODIURIL) 12.5 MG tablet Take 1 tablet (12.5 mg total) by mouth daily.  90 tablet  3  . insulin detemir (LEVEMIR) 100 UNIT/ML injection Inject 10 Units into the skin at bedtime.  10 mL  12  . Insulin Pen Needle 32G X 6 MM MISC Dispense 1 box of 50 insulin pen needles.  50 each  0  . levothyroxine (SYNTHROID, LEVOTHROID) 50 MCG tablet Take 50 mcg by mouth daily.      Marland Kitchen lisinopril (PRINIVIL,ZESTRIL) 10 MG tablet Take 1 tablet (10 mg total) by mouth daily.  30 tablet  3  . metFORMIN (GLUCOPHAGE) 500 MG tablet Take 1 tablet  (500 mg total) by mouth 2 (two) times daily with a meal.  60 tablet  3  . simvastatin (ZOCOR) 10 MG tablet Take 1 tablet (10 mg total) by mouth at bedtime.  30 tablet  3  . traZODone (DESYREL) 100 MG tablet Take 1 tablet (100 mg total) by mouth at bedtime.  30 tablet  6   No current facility-administered medications on file prior to visit.    Review of Systems  Constitutional: Negative.   Respiratory: Negative.   Cardiovascular: Negative.   Gastrointestinal: Negative for nausea, vomiting, diarrhea and constipation.  Endocrine: Negative.   Genitourinary: Negative.   Neurological: Negative for dizziness, syncope, weakness, light-headedness and headaches.  Psychiatric/Behavioral: Negative for behavioral problems, confusion and agitation. The patient is not nervous/anxious.     BP 108/68  Pulse 88  Resp 12  Wt 136 lb (61.689 kg)  BMI 23.71 kg/m2  SpO2 99%    Objective:   Physical Exam  Constitutional: She is oriented to person, place, and time.  Pleasant female in no apparent distress  HENT:  Head: Normocephalic and atraumatic.  Cardiovascular: Normal rate, regular rhythm and normal heart sounds.  Exam reveals no gallop.   No murmur heard. Pulmonary/Chest: Effort normal and breath sounds normal. No respiratory distress. She has no wheezes. She has no rales.  Abdominal: Soft. Bowel sounds are normal.  Musculoskeletal: She exhibits no edema.  Neurological: She is alert and oriented to person, place, and time.  Skin: Skin is warm and dry.  Psychiatric: She has a normal mood and affect. Her behavior is normal. Judgment and thought content normal.       Assessment & Plan:

## 2012-08-03 ENCOUNTER — Encounter: Payer: Self-pay | Admitting: Adult Health

## 2012-08-03 NOTE — Assessment & Plan Note (Signed)
Cholesterol also improving with medication. She will be seeing a nutritionist which I anticipate will help her further. Also consider increasing cholesterol medication; however, will wait until patient sees cardiology. Given patient's recent symptoms of syncope I do not want to make medication changes until this has been evaluated further. Followup after cardiology appointment.

## 2012-08-03 NOTE — Assessment & Plan Note (Signed)
Patient felt improved after we stopped the atenolol. I suspected that her heart rate was dropping which was contributing to her near syncope episodes. She did report having one other episode this week in the setting of extreme heat. She has appointment with cardiology in approximately one week to evaluate this further. Encouraged her to drink fluid especially when temperatures increased.

## 2012-08-03 NOTE — Patient Instructions (Signed)
  Your hemoglobin A1c and cholesterol have improved.  You are doing very well.  You have been referred to a nutritionist. The nutritionist will help you with your diet to further control your diabetes and cholesterol.  Remember to drink fluids to stay hydrated.  Please make a followup appointment after you have seen cardiologist.

## 2012-08-03 NOTE — Assessment & Plan Note (Addendum)
Hemoglobin A1c has gone from 12.3 to 8.3 in 3 months. Microalbumin has gone from 169.84 to 41.58. Continue lisinopril. Patient is compliant with her medications. She is having some difficulty with diet. Referred to nutritionist.

## 2012-08-08 ENCOUNTER — Ambulatory Visit: Payer: BC Managed Care – PPO | Admitting: Adult Health

## 2012-08-11 ENCOUNTER — Ambulatory Visit (INDEPENDENT_AMBULATORY_CARE_PROVIDER_SITE_OTHER): Payer: BC Managed Care – PPO | Admitting: Cardiovascular Disease

## 2012-08-11 ENCOUNTER — Telehealth: Payer: Self-pay | Admitting: Adult Health

## 2012-08-11 ENCOUNTER — Encounter: Payer: Self-pay | Admitting: Cardiovascular Disease

## 2012-08-11 VITALS — BP 115/75 | HR 137 | Ht 63.0 in | Wt 133.8 lb

## 2012-08-11 DIAGNOSIS — I10 Essential (primary) hypertension: Secondary | ICD-10-CM

## 2012-08-11 DIAGNOSIS — E785 Hyperlipidemia, unspecified: Secondary | ICD-10-CM

## 2012-08-11 DIAGNOSIS — R42 Dizziness and giddiness: Secondary | ICD-10-CM

## 2012-08-11 DIAGNOSIS — R55 Syncope and collapse: Secondary | ICD-10-CM

## 2012-08-11 MED ORDER — METOPROLOL SUCCINATE ER 25 MG PO TB24: 25.0000 mg | ORAL_TABLET | Freq: Every day | ORAL | Status: DC

## 2012-08-11 NOTE — Assessment & Plan Note (Signed)
His blood pressure starts increasing after stopping hydrochlorothiazide, the dose of lisinopril can be increased.

## 2012-08-11 NOTE — Patient Instructions (Addendum)
Stop taking Hydrochlorothiazide.  Start Metoprolol ER 25 mg once daily.   Your physician has requested that you have an echocardiogram. Echocardiography is a painless test that uses sound waves to create images of your heart. It provides your doctor with information about the size and shape of your heart and how well your heart's chambers and valves are working. This procedure takes approximately one hour. There are no restrictions for this procedure.  Follow up after echo.

## 2012-08-11 NOTE — Assessment & Plan Note (Signed)
I suspect that her symptoms are likely related to orthostatic hypotension as well as autonomic dysfunction worsened by some of her medications. She was orthostatic today with a drop of systolic blood pressure from 159 to 115. Resting heart rate increased from 106 to 137. She felt dizzy and nauseous with this. I recommend stopping hydrochlorothiazide which might be worsening her orthostatic hypotension. I advised her to increase her fluid intake. She did have bradycardia while she was on atenolol. However, she is not tachycardic and seems to symptomatic with this. Thus, I will start her on small dose metoprolol extended release 25 mg once daily. I also think that the trazodone is causing some of her symptoms. This should be changed to another medication. I will obtain an echocardiogram to make sure she does not have any structural heart abnormalities. I will have her followup with me after echocardiogram to make further adjustments in her medications.

## 2012-08-11 NOTE — Assessment & Plan Note (Signed)
Lab Results  Component Value Date   CHOL 268* 07/25/2012   HDL 59.20 07/25/2012   LDLDIRECT 168.1 07/25/2012   TRIG 115.0 07/25/2012   CHOLHDL 5 07/25/2012   I agree with treatment with simvastatin given her history of diabetes.

## 2012-08-11 NOTE — Telephone Encounter (Signed)
Pt spouse dropped off forms to be filled out  In box

## 2012-08-11 NOTE — Telephone Encounter (Signed)
Forms given to Raquel to complete.

## 2012-08-11 NOTE — Progress Notes (Signed)
HPI  This is a 57 year old female who was referred by Charolette Forward for evaluation of dizziness and presyncope. The patient has no previous cardiac history. She has known history of diabetes, hypertension and hyperlipidemia. She was without medications for a while but resumed medical treatment early this year. Since then, she has noticed frequent episodes of dizziness and presyncopal episodes. This can happen with changing position or sometimes in a standing position. She was on atenolol 25 mg once daily and was noted to be bradycardic. This was stopped but she continued to have the symptoms. She has noticed some of these episodes after she took trazodone for insomnia. She is also a small dose hydrochlorothiazide. She denies any chest discomfort or dyspnea. She appears to be very anxious. She had labs done recently which over all unremarkable including thyroid function. BUN was at the upper limits of normal.  No Known Allergies   Current Outpatient Prescriptions on File Prior to Visit  Medication Sig Dispense Refill  . BAYER ASPIRIN EC LOW DOSE PO Take 81 mg by mouth daily.      Marland Kitchen gabapentin (NEURONTIN) 300 MG capsule Take 1 capsule in the evening.  60 capsule  2  . glucose blood test strip Please dispense Contour next EZ test strips. She is testing 4 times a day. Diagnosis code 250.0  120 each  12  . hydrochlorothiazide (HYDRODIURIL) 12.5 MG tablet Take 1 tablet (12.5 mg total) by mouth daily.  90 tablet  3  . insulin detemir (LEVEMIR) 100 UNIT/ML injection Inject 10 Units into the skin at bedtime.  10 mL  12  . Insulin Pen Needle 32G X 6 MM MISC Dispense 1 box of 50 insulin pen needles.  50 each  0  . levothyroxine (SYNTHROID, LEVOTHROID) 50 MCG tablet Take 50 mcg by mouth daily.      Marland Kitchen lisinopril (PRINIVIL,ZESTRIL) 10 MG tablet Take 1 tablet (10 mg total) by mouth daily.  30 tablet  3  . metFORMIN (GLUCOPHAGE) 500 MG tablet Take 1 tablet (500 mg total) by mouth 2 (two) times daily with a meal.   60 tablet  3  . simvastatin (ZOCOR) 10 MG tablet Take 1 tablet (10 mg total) by mouth at bedtime.  30 tablet  3  . traZODone (DESYREL) 100 MG tablet Take 1 tablet (100 mg total) by mouth at bedtime.  30 tablet  6   No current facility-administered medications on file prior to visit.     Past Medical History  Diagnosis Date  . Diabetes mellitus without complication   . Hypertension   . Hyperlipidemia   . Hypothyroidism   . Diabetic neuropathy      Past Surgical History  Procedure Laterality Date  . Eye surgery      Cataract both eyes     Family History  Problem Relation Age of Onset  . COPD Mother   . Cancer Mother     Lung  . Pulmonary embolism Father      History   Social History  . Marital Status: Married    Spouse Name: N/A    Number of Children: N/A  . Years of Education: N/A   Occupational History  . Not on file.   Social History Main Topics  . Smoking status: Never Smoker   . Smokeless tobacco: Never Used  . Alcohol Use: No  . Drug Use: No  . Sexually Active: Not on file   Other Topics Concern  . Not on file  Social History Narrative   Patient lives at home with her husband. Patient has 4 adult children.     ROS Constitutional: Negative for fever, chills, diaphoresis, activity change, appetite change and fatigue.  HENT: Negative for hearing loss, nosebleeds, congestion, sore throat, facial swelling, drooling, trouble swallowing, neck pain, voice change, sinus pressure and tinnitus.  Eyes: Negative for photophobia, pain, discharge and visual disturbance.  Respiratory: Negative for apnea, cough, chest tightness, shortness of breath and wheezing.  Cardiovascular: Negative for chest pain and leg swelling.  Gastrointestinal: Negative for nausea, vomiting, abdominal pain, diarrhea, constipation, blood in stool and abdominal distention.  Genitourinary: Negative for dysuria, urgency, frequency, hematuria and decreased urine volume.  Musculoskeletal:  Negative for myalgias, back pain, joint swelling, arthralgias and gait problem.  Skin: Negative for color change, pallor, rash and wound.  Neurological: Negative for tremors, seizures, syncope, speech difficulty, weakness, light-headedness, numbness and headaches.  Psychiatric/Behavioral: Negative for suicidal ideas, hallucinations, behavioral problems and agitation. The patient is  nervous/anxious.     PHYSICAL EXAM   BP 115/75  Pulse 137  Ht 5\' 3"  (1.6 m)  Wt 133 lb 12 oz (60.669 kg)  BMI 23.7 kg/m2 Constitutional: She is oriented to person, place, and time. She appears well-developed and well-nourished. No distress.  HENT: No nasal discharge.  Head: Normocephalic and atraumatic.  Eyes: Pupils are equal and round. Right eye exhibits no discharge. Left eye exhibits no discharge.  Neck: Normal range of motion. Neck supple. No JVD present. No thyromegaly present.  Cardiovascular: Tachycardic, regular rhythm, normal heart sounds. Exam reveals no gallop and no friction rub. No murmur heard.  Pulmonary/Chest: Effort normal and breath sounds normal. No stridor. No respiratory distress. She has no wheezes. She has no rales. She exhibits no tenderness.  Abdominal: Soft. Bowel sounds are normal. She exhibits no distension. There is no tenderness. There is no rebound and no guarding.  Musculoskeletal: Normal range of motion. She exhibits no edema and no tenderness.  Neurological: She is alert and oriented to person, place, and time. Coordination normal.  Skin: Skin is warm and dry. No rash noted. She is not diaphoretic. No erythema. No pallor.  Psychiatric: She has a normal mood and affect. Her behavior is normal. Judgment and thought content normal.     EKG: Sinus  Tachycardia  -Left atrial enlargement.   BORDERLINE    ASSESSMENT AND PLAN

## 2012-08-12 ENCOUNTER — Other Ambulatory Visit: Payer: Self-pay

## 2012-08-12 ENCOUNTER — Other Ambulatory Visit (INDEPENDENT_AMBULATORY_CARE_PROVIDER_SITE_OTHER): Payer: BC Managed Care – PPO

## 2012-08-12 DIAGNOSIS — R55 Syncope and collapse: Secondary | ICD-10-CM

## 2012-08-19 ENCOUNTER — Telehealth: Payer: Self-pay | Admitting: Adult Health

## 2012-08-19 NOTE — Telephone Encounter (Signed)
Refaxed [employer] form with completed date.

## 2012-08-19 NOTE — Telephone Encounter (Signed)
Patient called stating she is having some issues with some disability papers. [employer] is stating that the first date of disability was not filled out correctly. Please call the patient directly to discuss.

## 2012-08-19 NOTE — Telephone Encounter (Signed)
Already spoke with pt, waiting on fax to clarify what is needed.

## 2012-08-19 NOTE — Telephone Encounter (Signed)
Jacqlyn Larsen can you call [name] and find out specifics. Thanks

## 2012-08-22 ENCOUNTER — Encounter: Payer: Self-pay | Admitting: Cardiovascular Disease

## 2012-08-22 ENCOUNTER — Ambulatory Visit (INDEPENDENT_AMBULATORY_CARE_PROVIDER_SITE_OTHER): Payer: BC Managed Care – PPO | Admitting: Cardiovascular Disease

## 2012-08-22 VITALS — BP 162/86 | HR 82 | Ht 63.0 in | Wt 135.0 lb

## 2012-08-22 DIAGNOSIS — E785 Hyperlipidemia, unspecified: Secondary | ICD-10-CM

## 2012-08-22 DIAGNOSIS — R55 Syncope and collapse: Secondary | ICD-10-CM

## 2012-08-22 DIAGNOSIS — I10 Essential (primary) hypertension: Secondary | ICD-10-CM

## 2012-08-22 MED ORDER — LISINOPRIL 40 MG PO TABS: 40.0000 mg | ORAL_TABLET | Freq: Every day | ORAL | Status: DC

## 2012-08-22 NOTE — Patient Instructions (Addendum)
Increase Lisinopril 40 mg once daily.  Continue other medications.  Follow up as needed.

## 2012-08-22 NOTE — Assessment & Plan Note (Signed)
Her blood pressure is now elevated after stopping hydrochlorothiazide. I will go ahead and increase the dose of lisinopril to 40 mg once daily. If blood pressure remains elevated, the dose of metoprolol can be increased if she is not bradycardic or amlodipine can be added.

## 2012-08-22 NOTE — Progress Notes (Signed)
HPI  This is a 57 year old female who is here today for followup regarding  dizziness and presyncope. The patient has no previous cardiac history. She has known history of diabetes, hypertension and hyperlipidemia. She was without medications for a while but resumed medical treatment early this year. Since then, she had frequent episodes of dizziness and presyncopal episodes.  She was on atenolol 25 mg once daily and was noted to be bradycardic. This was stopped but she continued to have the symptoms.  During last visit, she was noted to be significantly orthostatic with sinus tachycardia at baseline and a heart rate of 137 beats per minute. I stop hydrochlorothiazide and trazodone. I started her on small dose metoprolol. She reports complete resolution of her dizziness. Echocardiogram was overall unremarkable except for mild diastolic dysfunction.  No Known Allergies   Current Outpatient Prescriptions on File Prior to Visit  Medication Sig Dispense Refill  . BAYER ASPIRIN EC LOW DOSE PO Take 81 mg by mouth daily.      Marland Kitchen gabapentin (NEURONTIN) 300 MG capsule Take 1 capsule in the evening.  60 capsule  2  . glucose blood test strip Please dispense Contour next EZ test strips. She is testing 4 times a day. Diagnosis code 250.0  120 each  12  . insulin detemir (LEVEMIR) 100 UNIT/ML injection Inject 10 Units into the skin at bedtime.  10 mL  12  . Insulin Pen Needle 32G X 6 MM MISC Dispense 1 box of 50 insulin pen needles.  50 each  0  . levothyroxine (SYNTHROID, LEVOTHROID) 50 MCG tablet Take 50 mcg by mouth daily.      Marland Kitchen lisinopril (PRINIVIL,ZESTRIL) 10 MG tablet Take 1 tablet (10 mg total) by mouth daily.  30 tablet  3  . metFORMIN (GLUCOPHAGE) 500 MG tablet Take 1 tablet (500 mg total) by mouth 2 (two) times daily with a meal.  60 tablet  3  . metoprolol succinate (TOPROL XL) 25 MG 24 hr tablet Take 1 tablet (25 mg total) by mouth daily.  30 tablet  6  . simvastatin (ZOCOR) 10 MG tablet Take  1 tablet (10 mg total) by mouth at bedtime.  30 tablet  3   No current facility-administered medications on file prior to visit.     Past Medical History  Diagnosis Date  . Diabetes mellitus without complication   . Hypertension   . Hyperlipidemia   . Hypothyroidism   . Diabetic neuropathy      Past Surgical History  Procedure Laterality Date  . Eye surgery      Cataract both eyes     Family History  Problem Relation Age of Onset  . COPD Mother   . Cancer Mother     Lung  . Pulmonary embolism Father      History   Social History  . Marital Status: Married    Spouse Name: N/A    Number of Children: N/A  . Years of Education: N/A   Occupational History  . Not on file.   Social History Main Topics  . Smoking status: Never Smoker   . Smokeless tobacco: Never Used  . Alcohol Use: No  . Drug Use: No  . Sexually Active: Not on file   Other Topics Concern  . Not on file   Social History Narrative   Patient lives at home with her husband. Patient has 4 adult children.      PHYSICAL EXAM   BP 162/86  Pulse  82  Ht 5\' 3"  (1.6 m)  Wt 135 lb (61.236 kg)  BMI 23.92 kg/m2 Constitutional: She is oriented to person, place, and time. She appears well-developed and well-nourished. No distress.  HENT: No nasal discharge.  Head: Normocephalic and atraumatic.  Eyes: Pupils are equal and round. Right eye exhibits no discharge. Left eye exhibits no discharge.  Neck: Normal range of motion. Neck supple. No JVD present. No thyromegaly present.  Cardiovascular: Normal rate, regular rhythm, normal heart sounds. Exam reveals no gallop and no friction rub. No murmur heard.  Pulmonary/Chest: Effort normal and breath sounds normal. No stridor. No respiratory distress. She has no wheezes. She has no rales. She exhibits no tenderness.  Abdominal: Soft. Bowel sounds are normal. She exhibits no distension. There is no tenderness. There is no rebound and no guarding.    Musculoskeletal: Normal range of motion. She exhibits no edema and no tenderness.  Neurological: She is alert and oriented to person, place, and time. Coordination normal.  Skin: Skin is warm and dry. No rash noted. She is not diaphoretic. No erythema. No pallor.  Psychiatric: She has a normal mood and affect. Her behavior is normal. Judgment and thought content normal.     EKG: Sinus  Rhythm  Low voltage in precordial leads.   ABNORMAL     ASSESSMENT AND PLAN

## 2012-08-22 NOTE — Assessment & Plan Note (Signed)
This was due to orthostatic hypotension. Her symptoms resolved completely after stopping hydrochlorothiazide and trazodone as well as adding small dose metoprolol. Echocardiogram was overall unremarkable. Followup as needed.

## 2012-08-25 ENCOUNTER — Encounter: Payer: Self-pay | Admitting: Adult Health

## 2012-08-25 ENCOUNTER — Ambulatory Visit (INDEPENDENT_AMBULATORY_CARE_PROVIDER_SITE_OTHER): Payer: BC Managed Care – PPO | Admitting: Adult Health

## 2012-08-25 VITALS — BP 132/70 | HR 79 | Resp 12 | Wt 135.5 lb

## 2012-08-25 DIAGNOSIS — I10 Essential (primary) hypertension: Secondary | ICD-10-CM

## 2012-08-25 DIAGNOSIS — R42 Dizziness and giddiness: Secondary | ICD-10-CM

## 2012-08-25 MED ORDER — PREDNISONE (PAK) 10 MG PO TABS: 10.0000 mg | ORAL_TABLET | Freq: Every day | ORAL | Status: DC

## 2012-08-25 MED ORDER — DIAZEPAM 5 MG PO TABS: 5.0000 mg | ORAL_TABLET | Freq: Three times a day (TID) | ORAL | Status: DC | PRN

## 2012-08-25 NOTE — Patient Instructions (Addendum)
  Start Nasonex 2 sprays into each nostril once a day.  Start prednisone taper today. You will take 6 tablets today and decrease by one tablet daily until completed. Take this medication with food.  Prednisone will increase your blood sugar. Continue to monitor your blood sugar and increase Levemir by 2-4 units as needed.  Also take Valium 5 mg every 8 hours as needed for dizziness.  If your symptoms are not improved after this treatment, I will send you for CT of the head.  Please call or send message through Mychart with any questions or concerns.

## 2012-08-25 NOTE — Assessment & Plan Note (Signed)
Patient started Lisinopril 40 mg yesterday. Follow up in 2 weeks for blood work (bmp).

## 2012-08-25 NOTE — Assessment & Plan Note (Signed)
Patient is s/p cardiac w/u for her symptoms.  ? Inner ear as cause for her symptoms of dizziness (labryinthitis). Will treat with prednisone taper, nasonex nasal spray 2 per nostril and valium 5 mg q 8 hours prn for dizziness. Instructed to monitor blood glucose and adjust Levemir if glucose elevated. If symptoms do not improve with this treatment will send for head CT.

## 2012-08-25 NOTE — Progress Notes (Signed)
Subjective:    [name], female    DOB: 12/28/1955, 57 y.o.   MRN: [id number]  HPI  Ms. Deruyter is here for followup after cardiology consult and medication changes as well as followup for her presyncope. Her symptoms improved temporarily; however, she reports symptoms reoccurring for the last 2-3 days. She has started her metoprolol without any incident. Patient's lisinopril was increased by Dr. Fletcher Anon to 40 mg daily. She started the increased dose yesterday. She reports having an episode of presyncope on Saturday. She says she went to stand up and felt lightheaded. She is also experiencing these symptoms when she blows her nose. She clarifies that her symptoms are not necessarily that she is going to pass out, but more so that everything begins to spin. She experienced another episode yesterday after leaving a restaurant.   Current Outpatient Prescriptions on File Prior to Visit  Medication Sig Dispense Refill  . BAYER ASPIRIN EC LOW DOSE PO Take 81 mg by mouth daily.      Marland Kitchen gabapentin (NEURONTIN) 300 MG capsule Take 1 capsule in the evening.  60 capsule  2  . glucose blood test strip Please dispense Contour next EZ test strips. She is testing 4 times a day. Diagnosis code 250.0  120 each  12  . insulin detemir (LEVEMIR) 100 UNIT/ML injection Inject 10 Units into the skin at bedtime.  10 mL  12  . Insulin Pen Needle 32G X 6 MM MISC Dispense 1 box of 50 insulin pen needles.  50 each  0  . levothyroxine (SYNTHROID, LEVOTHROID) 50 MCG tablet Take 50 mcg by mouth daily.      Marland Kitchen lisinopril (PRINIVIL,ZESTRIL) 40 MG tablet Take 1 tablet (40 mg total) by mouth daily.  30 tablet  6  . metFORMIN (GLUCOPHAGE) 500 MG tablet Take 1 tablet (500 mg total) by mouth 2 (two) times daily with a meal.  60 tablet  3  . metoprolol succinate (TOPROL XL) 25 MG 24 hr tablet Take 1 tablet (25 mg total) by mouth daily.  30 tablet  6  . simvastatin (ZOCOR) 10 MG tablet Take 1 tablet (10 mg total) by mouth at  bedtime.  30 tablet  3   No current facility-administered medications on file prior to visit.    Review of Systems  Constitutional: Negative for fever and chills.  HENT: Positive for postnasal drip and sinus pressure. Negative for nosebleeds, congestion and tinnitus.   Respiratory: Negative.   Cardiovascular: Negative.   Neurological: Positive for dizziness. Negative for tremors, syncope, weakness, numbness and headaches.  Psychiatric/Behavioral: Negative.        Objective:   Physical Exam  Constitutional: She is oriented to person, place, and time. She appears well-developed and well-nourished. No distress.  HENT:  Head: Normocephalic and atraumatic.  Right Ear: External ear normal.  Left Ear: External ear normal.  Nose: Nose normal.  Mouth/Throat: Oropharynx is clear and moist.  Eyes: Conjunctivae and EOM are normal. Pupils are equal, round, and reactive to light.  Cardiovascular: Normal rate, regular rhythm and normal heart sounds.  Exam reveals no gallop and no friction rub.   No murmur heard. Pulmonary/Chest: Effort normal and breath sounds normal. No respiratory distress. She has no wheezes. She has no rales.  Musculoskeletal: Normal range of motion.  Neurological: She is alert and oriented to person, place, and time.  Skin: Skin is warm and dry.  Psychiatric: She has a normal mood and affect. Her behavior is normal. Judgment and  thought content normal.          Assessment & Plan:

## 2012-08-29 ENCOUNTER — Encounter: Payer: Self-pay | Admitting: Emergency Medicine

## 2012-08-29 ENCOUNTER — Telehealth: Payer: Self-pay | Admitting: Adult Health

## 2012-08-29 NOTE — Telephone Encounter (Signed)
Patient called and stated she was here last week and got prednisone. Tomorrow is her last day of the medication. She is feeling better. This is just an [place]

## 2012-09-01 ENCOUNTER — Telehealth: Payer: Self-pay | Admitting: *Deleted

## 2012-09-01 NOTE — Telephone Encounter (Signed)
We can give her a note. She has been very consistent with reporting symptoms so I am confident she will let us know if her symptoms return.

## 2012-09-01 NOTE — Telephone Encounter (Signed)
Pt states symptoms have improved since taking and completing Prednisone. Has not had any more presyncope episodes. She would like to return to work next Monday 09/08/12 but needs work note to return. Her followup appointment is scheduled 09/09/12 and Amber told her insurance wouldn't cover if she came before the 2 weeks because she tried to reschedule appointment to this week. Do you need to see her before she goes back to work or can we just get her a note?

## 2012-09-02 NOTE — Telephone Encounter (Signed)
Pt notified that letter is ready for pick up, letter placed up front.

## 2012-09-03 ENCOUNTER — Telehealth: Payer: Self-pay | Admitting: Adult Health

## 2012-09-03 NOTE — Telephone Encounter (Signed)
Form in Raquel's folder to be completed.

## 2012-09-03 NOTE — Telephone Encounter (Signed)
Patient dropped off continuing disability claim form to be filled out In box

## 2012-09-03 NOTE — Telephone Encounter (Signed)
Form faxed to [employer] [phone]

## 2012-09-09 ENCOUNTER — Ambulatory Visit: Payer: BC Managed Care – PPO | Admitting: Adult Health

## 2012-09-12 ENCOUNTER — Ambulatory Visit (INDEPENDENT_AMBULATORY_CARE_PROVIDER_SITE_OTHER): Payer: BC Managed Care – PPO | Admitting: Adult Health

## 2012-09-12 ENCOUNTER — Encounter: Payer: Self-pay | Admitting: Adult Health

## 2012-09-12 VITALS — BP 148/68 | HR 78 | Temp 98.3°F | Resp 12 | Wt 146.5 lb

## 2012-09-12 DIAGNOSIS — R42 Dizziness and giddiness: Secondary | ICD-10-CM

## 2012-09-12 DIAGNOSIS — G479 Sleep disorder, unspecified: Secondary | ICD-10-CM

## 2012-09-12 DIAGNOSIS — I10 Essential (primary) hypertension: Secondary | ICD-10-CM

## 2012-09-12 MED ORDER — TRAZODONE HCL 100 MG PO TABS: 100.0000 mg | ORAL_TABLET | Freq: Every day | ORAL | Status: DC

## 2012-09-12 NOTE — Progress Notes (Signed)
  Subjective:    [name], female    DOB: 1955/10/14, 57 y.o.   MRN: [id number]  HPI  Patient is a pleasant 57 year old female who presents to clinic for followup for her dizziness. During last visit she was treated for labyrinthitis with prednisone taper, Valium. Patient has since return to work. She reports symptoms have completely resolved. She is reporting trouble sleeping since returning to work. She had been taken off trazodone thinking that this may be the cause for her dizziness. Note that patient had been on trazodone for years prior to this episode of dizziness.  Current Outpatient Prescriptions on File Prior to Visit  Medication Sig Dispense Refill  . BAYER ASPIRIN EC LOW DOSE PO Take 81 mg by mouth daily.      . diazepam (VALIUM) 5 MG tablet Take 1 tablet (5 mg total) by mouth every 8 (eight) hours as needed for anxiety.  30 tablet  1  . gabapentin (NEURONTIN) 300 MG capsule Take 1 capsule in the evening.  60 capsule  2  . glucose blood test strip Please dispense Contour next EZ test strips. She is testing 4 times a day. Diagnosis code 250.0  120 each  12  . insulin detemir (LEVEMIR) 100 UNIT/ML injection Inject 10 Units into the skin at bedtime.  10 mL  12  . Insulin Pen Needle 32G X 6 MM MISC Dispense 1 box of 50 insulin pen needles.  50 each  0  . levothyroxine (SYNTHROID, LEVOTHROID) 50 MCG tablet Take 50 mcg by mouth daily.      Marland Kitchen lisinopril (PRINIVIL,ZESTRIL) 40 MG tablet Take 1 tablet (40 mg total) by mouth daily.  30 tablet  6  . metFORMIN (GLUCOPHAGE) 500 MG tablet Take 1 tablet (500 mg total) by mouth 2 (two) times daily with a meal.  60 tablet  3  . metoprolol succinate (TOPROL XL) 25 MG 24 hr tablet Take 1 tablet (25 mg total) by mouth daily.  30 tablet  6  . simvastatin (ZOCOR) 10 MG tablet Take 1 tablet (10 mg total) by mouth at bedtime.  30 tablet  3   No current facility-administered medications on file prior to visit.     Review of Systems   Constitutional: Negative.   HENT: Negative.   Respiratory: Negative.   Cardiovascular: Negative.   Gastrointestinal: Negative.   Neurological: Negative for dizziness, tremors, syncope, weakness, light-headedness, numbness and headaches.  Psychiatric/Behavioral: Negative.   All other systems reviewed and are negative.       Objective:   Physical Exam  Constitutional: She is oriented to person, place, and time.  Pleasant 57 year old female in no distress  HENT:  Head: Normocephalic and atraumatic.  Cardiovascular: Normal rate, regular rhythm, normal heart sounds and intact distal pulses.  Exam reveals no gallop and no friction rub.   No murmur heard. Systolic blood pressure slightly elevated.  Pulmonary/Chest: Effort normal and breath sounds normal.  Abdominal: Soft. Bowel sounds are normal.  Musculoskeletal: Normal range of motion. She exhibits no edema and no tenderness.  Neurological: She is alert and oriented to person, place, and time. No cranial nerve deficit. Coordination normal.  Skin: Skin is warm.  Lower extremity skin dry and flaky  Psychiatric: She has a normal mood and affect. Her behavior is normal. Judgment and thought content normal.          Assessment & Plan:

## 2012-09-12 NOTE — Patient Instructions (Addendum)
  You are doing very well.    I have sent in a prescription for Trazodone for sleep.  Remember to take your blood pressure medication daily.  Continue walking at least 3-4 times a week.

## 2012-09-12 NOTE — Assessment & Plan Note (Signed)
Restart trazodone 100 mg at bedtime as needed for sleep. Report any uncomfortable side effects.

## 2012-09-12 NOTE — Assessment & Plan Note (Signed)
Patient responded well to prednisone taper, Valium and treatment for labyrinthitis. She has return to work and has had no more episodes of syncope, near-syncope or dizziness. RTC if symptoms return.

## 2012-09-12 NOTE — Assessment & Plan Note (Signed)
Systolic blood pressure slightly elevated. Patient reports she has not taken her blood pressure medicine today. Reminded importance of taking pressure medication daily. Patient agreed

## 2012-09-22 ENCOUNTER — Telehealth: Payer: Self-pay | Admitting: *Deleted

## 2012-09-22 ENCOUNTER — Other Ambulatory Visit: Payer: Self-pay | Admitting: Adult Health

## 2012-09-22 NOTE — Telephone Encounter (Signed)
Pt's husband notified.

## 2012-09-22 NOTE — Telephone Encounter (Signed)
Patient to resume her fluid pill. A

## 2012-09-22 NOTE — Telephone Encounter (Signed)
Pt's husband called, stating pt has bilateral foot edema, began since last OV, no other symptoms. Does she need to start back on fluid pill?

## 2012-10-01 ENCOUNTER — Telehealth: Payer: Self-pay | Admitting: *Deleted

## 2012-10-01 NOTE — Telephone Encounter (Signed)
Left message for pt to return phone call, regarding DM chart review. Requesting most recent eye exam, foot exam, flu vaccine, and pneumovax.

## 2012-10-02 ENCOUNTER — Encounter: Payer: Self-pay | Admitting: *Deleted

## 2012-10-02 NOTE — Telephone Encounter (Signed)
Pt's husband left VM, pt has not had flu vaccine, pneumonia vaccine, or foot exam. Last eye exam was 03/06/11. Pt has appointment 10/17/12, will discuss pneumovax and eye referral, and will complete foot exam at that time.

## 2012-10-17 ENCOUNTER — Ambulatory Visit: Payer: BC Managed Care – PPO | Admitting: Adult Health

## 2012-10-24 ENCOUNTER — Encounter: Payer: Self-pay | Admitting: *Deleted

## 2012-10-24 ENCOUNTER — Encounter: Payer: Self-pay | Admitting: Adult Health

## 2012-10-24 ENCOUNTER — Ambulatory Visit (INDEPENDENT_AMBULATORY_CARE_PROVIDER_SITE_OTHER): Payer: BC Managed Care – PPO | Admitting: Adult Health

## 2012-10-24 VITALS — BP 158/76 | HR 78 | Resp 12 | Wt 145.0 lb

## 2012-10-24 DIAGNOSIS — Z13820 Encounter for screening for osteoporosis: Secondary | ICD-10-CM | POA: Insufficient documentation

## 2012-10-24 DIAGNOSIS — E1149 Type 2 diabetes mellitus with other diabetic neurological complication: Secondary | ICD-10-CM

## 2012-10-24 DIAGNOSIS — E1142 Type 2 diabetes mellitus with diabetic polyneuropathy: Secondary | ICD-10-CM

## 2012-10-24 DIAGNOSIS — I10 Essential (primary) hypertension: Secondary | ICD-10-CM

## 2012-10-24 DIAGNOSIS — Z1211 Encounter for screening for malignant neoplasm of colon: Secondary | ICD-10-CM

## 2012-10-24 DIAGNOSIS — Z79899 Other long term (current) drug therapy: Secondary | ICD-10-CM

## 2012-10-24 DIAGNOSIS — G629 Polyneuropathy, unspecified: Secondary | ICD-10-CM

## 2012-10-24 DIAGNOSIS — G589 Mononeuropathy, unspecified: Secondary | ICD-10-CM

## 2012-10-24 DIAGNOSIS — E785 Hyperlipidemia, unspecified: Secondary | ICD-10-CM

## 2012-10-24 DIAGNOSIS — Z23 Encounter for immunization: Secondary | ICD-10-CM

## 2012-10-24 DIAGNOSIS — Z1239 Encounter for other screening for malignant neoplasm of breast: Secondary | ICD-10-CM

## 2012-10-24 LAB — HM DIABETES FOOT EXAM: HM Diabetic Foot Exam: ABNORMAL

## 2012-10-24 MED ORDER — PNEUMOCOCCAL VAC POLYVALENT 25 MCG/0.5ML IJ INJ: 0.5000 mL | INJECTION | INTRAMUSCULAR | Status: AC

## 2012-10-24 NOTE — Assessment & Plan Note (Addendum)
Last A1c 8.3 three months ago. Patient was started on metformin and levemir. Repeat A1c. Diabetic foot exam completed and documented. Discussed importance of yearly eye examination. Patient will schedule appointment.

## 2012-10-24 NOTE — Assessment & Plan Note (Signed)
Refer to GI for screening colonoscopy 

## 2012-10-24 NOTE — Assessment & Plan Note (Signed)
Patient feels improvement on gabapentin. Continue to monitor

## 2012-10-24 NOTE — Assessment & Plan Note (Signed)
Pneumococcal vaccine administered today during visit. Next one scheduled after age 57.

## 2012-10-24 NOTE — Assessment & Plan Note (Addendum)
Patient is currently on simvastatin 10 mg. Check lipids and hepatic panel

## 2012-10-24 NOTE — Assessment & Plan Note (Signed)
Patient will call and schedule her mammography at [facility]

## 2012-10-24 NOTE — Patient Instructions (Addendum)
  I am referring you to GI for your screening colonoscopy.  They will call you with appointment.  Please return next week for your labs. You need to be fasting for 8 hours prior.  Please schedule your eye exam. It is due.  Please schedule your mammogram.

## 2012-10-24 NOTE — Assessment & Plan Note (Signed)
-   DEXA scan ordered

## 2012-10-24 NOTE — Assessment & Plan Note (Signed)
Systolic pressure slightly up today during visit. Has been running well on metoprolol and lisinopril. Continue to follow. Check metabolic panel.

## 2012-10-24 NOTE — Progress Notes (Signed)
Subjective:    [name], female    DOB: 30-Oct-1955, 57 y.o.   MRN: [id number]  HPI  Patient is a pleasant 57 year old female who presents to clinic for followup of diabetes type 2 with neuropathy, hypertension, hyperlipidemia. She is feeling well. Patient has no concerns this visit. She reports taking her medications exactly as prescribed. Patient is very excited about possibility of new employment. She has been looking for new employment for some time now. Now that her health is improved she feels she is ready to make a move. Her current working conditions are not the best. She has been called for 3 interviews that one company. She is hoping that this will result in a job offer.    Past Medical History  Diagnosis Date  . Diabetes mellitus without complication   . Hypertension   . Hyperlipidemia   . Hypothyroidism   . Diabetic neuropathy     Past Surgical History  Procedure Laterality Date  . Eye surgery      Cataract both eyes    History   Social History  . Marital Status: Married    Spouse Name: N/A    Number of Children: N/A  . Years of Education: N/A   Occupational History  . Not on file.   Social History Main Topics  . Smoking status: Never Smoker   . Smokeless tobacco: Never Used  . Alcohol Use: No  . Drug Use: No  . Sexual Activity: Not on file   Other Topics Concern  . Not on file   Social History Narrative   Patient lives at home with her husband. Patient has 4 adult children.      Current Outpatient Prescriptions on File Prior to Visit  Medication Sig Dispense Refill  . BAYER ASPIRIN EC LOW DOSE PO Take 81 mg by mouth daily.      . diazepam (VALIUM) 5 MG tablet Take 1 tablet (5 mg total) by mouth every 8 (eight) hours as needed for anxiety.  30 tablet  1  . gabapentin (NEURONTIN) 300 MG capsule Take 1 capsule in the evening.  60 capsule  2  . glucose blood test strip Please dispense Contour next EZ test strips. She is testing 4 times  a day. Diagnosis code 250.0  120 each  12  . insulin detemir (LEVEMIR) 100 UNIT/ML injection Inject 10 Units into the skin at bedtime.  10 mL  12  . Insulin Pen Needle 32G X 6 MM MISC Dispense 1 box of 50 insulin pen needles.  50 each  0  . levothyroxine (SYNTHROID, LEVOTHROID) 50 MCG tablet Take 50 mcg by mouth daily.      Marland Kitchen lisinopril (PRINIVIL,ZESTRIL) 40 MG tablet Take 1 tablet (40 mg total) by mouth daily.  30 tablet  6  . metFORMIN (GLUCOPHAGE) 500 MG tablet TAKE ONE TABLET BY MOUTH TWICE DAILY WITH MEALS  60 tablet  5  . metoprolol succinate (TOPROL XL) 25 MG 24 hr tablet Take 1 tablet (25 mg total) by mouth daily.  30 tablet  6  . simvastatin (ZOCOR) 10 MG tablet Take 1 tablet (10 mg total) by mouth at bedtime.  30 tablet  3  . traZODone (DESYREL) 100 MG tablet Take 1 tablet (100 mg total) by mouth at bedtime.  30 tablet  6   No current facility-administered medications on file prior to visit.     Review of Systems  Constitutional: Negative.   HENT: Negative.   Eyes: Negative.  Respiratory: Negative.   Cardiovascular: Negative.   Gastrointestinal: Negative.   Endocrine: Negative.   Genitourinary: Negative.   Musculoskeletal: Negative.   Skin:       Dry skin  Allergic/Immunologic: Negative.   Neurological: Positive for numbness. Negative for dizziness, tremors, weakness, light-headedness and headaches.       Numbness and tingling bilateral LE.  Hematological: Negative.   Psychiatric/Behavioral: Negative.     BP 158/76  Pulse 78  Resp 12  Wt 145 lb (65.772 kg)  BMI 25.69 kg/m2  SpO2 98%    Objective:   Physical Exam  Constitutional: She is oriented to person, place, and time.  57 y/o female in NAD  HENT:  Head: Normocephalic and atraumatic.  Right Ear: External ear normal.  Left Ear: External ear normal.  Nose: Nose normal.  Mouth/Throat: Oropharynx is clear and moist.  Eyes: Conjunctivae and EOM are normal. Pupils are equal, round, and reactive to light.   Neck: Normal range of motion. Neck supple. No tracheal deviation present. No thyromegaly present.  Cardiovascular: Normal rate, regular rhythm, normal heart sounds and intact distal pulses.  Exam reveals no gallop and no friction rub.   No murmur heard. Pulmonary/Chest: Effort normal and breath sounds normal. No respiratory distress. She has no wheezes. She has no rales. She exhibits no tenderness.  Abdominal: Soft. Bowel sounds are normal. She exhibits no distension and no mass. There is no tenderness. There is no rebound and no guarding.  Musculoskeletal: Normal range of motion. She exhibits no edema and no tenderness.  Lymphadenopathy:    She has no cervical adenopathy.  Neurological: She is alert and oriented to person, place, and time. Coordination normal.  Loss of sensation to sharp objects bilateral feet  Skin: Skin is warm and dry.  Psychiatric: She has a normal mood and affect. Her behavior is normal. Judgment and thought content normal.      Assessment & Plan:

## 2012-10-28 ENCOUNTER — Other Ambulatory Visit: Payer: BC Managed Care – PPO

## 2012-10-30 ENCOUNTER — Encounter: Payer: Self-pay | Admitting: *Deleted

## 2012-10-31 ENCOUNTER — Other Ambulatory Visit (INDEPENDENT_AMBULATORY_CARE_PROVIDER_SITE_OTHER): Payer: BC Managed Care – PPO

## 2012-10-31 DIAGNOSIS — I10 Essential (primary) hypertension: Secondary | ICD-10-CM

## 2012-10-31 DIAGNOSIS — E785 Hyperlipidemia, unspecified: Secondary | ICD-10-CM

## 2012-10-31 DIAGNOSIS — E1149 Type 2 diabetes mellitus with other diabetic neurological complication: Secondary | ICD-10-CM

## 2012-10-31 DIAGNOSIS — E1142 Type 2 diabetes mellitus with diabetic polyneuropathy: Secondary | ICD-10-CM

## 2012-10-31 DIAGNOSIS — Z79899 Other long term (current) drug therapy: Secondary | ICD-10-CM

## 2012-10-31 LAB — LIPID PANEL
Cholesterol: 205 mg/dL — ABNORMAL HIGH (ref 0–200)
HDL: 61.8 mg/dL (ref >39.00)
Total CHOL/HDL Ratio: 3
Triglycerides: 56 mg/dL (ref 0.0–149.0)
VLDL: 11.2 mg/dL (ref 0.0–40.0)

## 2012-10-31 LAB — BASIC METABOLIC PANEL
BUN: 29 mg/dL — ABNORMAL HIGH (ref 6–23)
CO2: 27 mEq/L (ref 19–32)
Calcium: 9.6 mg/dL (ref 8.4–10.5)
Chloride: 107 mEq/L (ref 96–112)
Creatinine, Ser: 0.9 mg/dL (ref 0.4–1.2)
GFR: 70.86 mL/min (ref >60.00)
Glucose, Bld: 92 mg/dL (ref 70–99)
Potassium: 5 mEq/L (ref 3.5–5.1)
Sodium: 138 mEq/L (ref 135–145)

## 2012-10-31 LAB — HEPATIC FUNCTION PANEL
ALT: 17 U/L (ref 0–35)
AST: 16 U/L (ref 0–37)
Albumin: 3.7 g/dL (ref 3.5–5.2)
Alkaline Phosphatase: 42 U/L (ref 39–117)
Bilirubin, Direct: 0 mg/dL (ref 0.0–0.3)
Total Bilirubin: 0.6 mg/dL (ref 0.3–1.2)
Total Protein: 6.6 g/dL (ref 6.0–8.3)

## 2012-10-31 LAB — HEMOGLOBIN A1C: Hgb A1c MFr Bld: 7.4 % — ABNORMAL HIGH (ref 4.6–6.5)

## 2012-10-31 LAB — LDL CHOLESTEROL, DIRECT: Direct LDL: 126.9 mg/dL

## 2012-12-11 ENCOUNTER — Other Ambulatory Visit: Payer: Self-pay | Admitting: Adult Health

## 2013-01-08 ENCOUNTER — Other Ambulatory Visit: Payer: Self-pay

## 2013-02-20 ENCOUNTER — Ambulatory Visit: Payer: BC Managed Care – PPO | Admitting: Adult Health

## 2013-02-27 ENCOUNTER — Ambulatory Visit (INDEPENDENT_AMBULATORY_CARE_PROVIDER_SITE_OTHER): Payer: BC Managed Care – PPO | Admitting: Adult Health

## 2013-02-27 ENCOUNTER — Encounter: Payer: Self-pay | Admitting: Adult Health

## 2013-02-27 VITALS — BP 144/80 | HR 73 | Temp 98.4°F | Resp 14 | Ht 63.0 in | Wt 153.0 lb

## 2013-02-27 DIAGNOSIS — Z79899 Other long term (current) drug therapy: Secondary | ICD-10-CM

## 2013-02-27 DIAGNOSIS — I10 Essential (primary) hypertension: Secondary | ICD-10-CM

## 2013-02-27 DIAGNOSIS — G629 Polyneuropathy, unspecified: Secondary | ICD-10-CM

## 2013-02-27 DIAGNOSIS — E119 Type 2 diabetes mellitus without complications: Secondary | ICD-10-CM

## 2013-02-27 DIAGNOSIS — G589 Mononeuropathy, unspecified: Secondary | ICD-10-CM

## 2013-02-27 DIAGNOSIS — E785 Hyperlipidemia, unspecified: Secondary | ICD-10-CM

## 2013-02-27 MED ORDER — GABAPENTIN 300 MG PO CAPS: 300.0000 mg | ORAL_CAPSULE | Freq: Three times a day (TID) | ORAL | Status: DC

## 2013-02-27 NOTE — Assessment & Plan Note (Signed)
Check lipids. Follow up in 3 months

## 2013-02-27 NOTE — Patient Instructions (Signed)
  Continue to do foot exams after your shower. Do not walk barefoot and wear proper fitting shoes. Continue your current dose of levemir (10 units at bedtime) and metformin.  Monitor your b/p. Please brings readings with you on your next visit. If b/p continue to stay elevated will adjust medication  Continue to follow a healthy diet. You have been doing very well with your blood sugars.  Please have your fasting blood work done at your earliest convenience.  Follow up in 3 month.

## 2013-02-27 NOTE — Progress Notes (Signed)
Subjective:    [name], female    DOB: Dec 30, 1955, 57 y.o.   MRN: [id number]  HPI  Jamie Arias is a pleasant 57 year old female who presents to clinic for followup of hypertension, hyperlipidemia and diabetes mellitus type 2 with neuropathy. Pertaining to her hypertension, her blood pressure is slightly elevated today. She reports that she has been under a lot of stress. Her firstborn grandchild just received orders for deployment to the [place]. With all the turmoil that is going on in this area, patient is extremely concerned for his safety. She has been taking her medication exactly as prescribed. Pertaining to her hyperlipidemia, she is taking her simvastatin 10 mg every evening. She reports trying to follow a healthy diet. Pertaining to her diabetes with neuropathy, she reports that her blood sugars are very well controlled. She is taking Levemir 10 units at bedtime and she is also taking metformin. Reports that her blood sugars are below 110. She has been taking gabapentin for the neuropathy. Her current dose is 300 mg at bedtime. Patient does not believe that medication is working as she has been experiencing tingling. Overall, she is feeling very well.   Current Outpatient Prescriptions on File Prior to Visit  Medication Sig Dispense Refill  . BAYER ASPIRIN EC LOW DOSE PO Take 81 mg by mouth daily.      Marland Kitchen glucose blood test strip Please dispense Contour next EZ test strips. She is testing 4 times a day. Diagnosis code 250.0  120 each  12  . insulin detemir (LEVEMIR) 100 UNIT/ML injection Inject 10 Units into the skin at bedtime.  10 mL  12  . levothyroxine (SYNTHROID, LEVOTHROID) 50 MCG tablet Take 50 mcg by mouth daily.      Marland Kitchen lisinopril (PRINIVIL,ZESTRIL) 40 MG tablet Take 1 tablet (40 mg total) by mouth daily.  30 tablet  6  . metFORMIN (GLUCOPHAGE) 500 MG tablet TAKE ONE TABLET BY MOUTH TWICE DAILY WITH MEALS  60 tablet  5  . metoprolol succinate (TOPROL XL) 25 MG 24 hr  tablet Take 1 tablet (25 mg total) by mouth daily.  30 tablet  6  . simvastatin (ZOCOR) 10 MG tablet Take 1 tablet (10 mg total) by mouth at bedtime.  30 tablet  3  . traZODone (DESYREL) 100 MG tablet Take 1 tablet (100 mg total) by mouth at bedtime.  30 tablet  6  . diazepam (VALIUM) 5 MG tablet Take 1 tablet (5 mg total) by mouth every 8 (eight) hours as needed for anxiety.  30 tablet  1  . Insulin Pen Needle 32G X 6 MM MISC Dispense 1 box of 50 insulin pen needles.  50 each  0   No current facility-administered medications on file prior to visit.     Review of Systems  Constitutional: Negative.   HENT: Negative.   Eyes: Negative.   Respiratory: Negative.   Cardiovascular: Negative.   Gastrointestinal: Negative.   Endocrine: Negative.   Genitourinary: Negative.   Musculoskeletal: Negative.   Skin: Negative.   Neurological:       Tingling of lower extremities. Taking Neurontin 300 mg. Feel medication not very effective.  Psychiatric/Behavioral: Positive for sleep disturbance. Negative for suicidal ideas, behavioral problems, confusion, self-injury, decreased concentration and agitation. The patient is nervous/anxious.        Objective:   Physical Exam  Constitutional: She is oriented to person, place, and time. She appears well-developed and well-nourished. No distress.  HENT:  Head: Normocephalic  and atraumatic.  Cardiovascular: Normal rate, regular rhythm, normal heart sounds and intact distal pulses.  Exam reveals no gallop and no friction rub.   No murmur heard. Pulmonary/Chest: Effort normal and breath sounds normal. No respiratory distress. She has no wheezes. She has no rales. She exhibits no tenderness.  Abdominal: Soft. Bowel sounds are normal.  Musculoskeletal: Normal range of motion.  Neurological: She is alert and oriented to person, place, and time.  Skin: Skin is warm and dry. No rash noted. No erythema. No pallor.  Psychiatric: She has a normal mood and affect.  Her behavior is normal. Judgment and thought content normal.   BP 144/80  Pulse 73  Temp(Src) 98.4 F (36.9 C)  Resp 14  Ht 5\' 3"  (1.6 m)  Wt 153 lb (69.4 kg)  BMI 27.11 kg/m2  SpO2 97%      Assessment & Plan:

## 2013-02-27 NOTE — Assessment & Plan Note (Signed)
Patient well-controlled on Levemir and metformin. Continue to monitor. Check A1c. Followup in 3 months

## 2013-02-27 NOTE — Progress Notes (Signed)
Pre-visit discussion using our clinic review tool. No additional management support is needed unless otherwise documented below in the visit note.  

## 2013-02-27 NOTE — Assessment & Plan Note (Signed)
Increase gabapentin to 300 mg 3 times a day. Continue to monitor. If medication does not help alleviate symptoms further, consider switching to Lyrica.

## 2013-02-27 NOTE — Assessment & Plan Note (Addendum)
Slightly elevated today. Patient is under considerable amount of stress. Please see history of present illness. No changes in medications at this time. Followup in 3 months or sooner if necessary. She will monitor blood pressure.  If blood pressure stays elevated will increase blood pressure medication. Check metabolic panel

## 2013-03-02 ENCOUNTER — Other Ambulatory Visit: Payer: Self-pay | Admitting: *Deleted

## 2013-03-02 MED ORDER — METOPROLOL SUCCINATE ER 25 MG PO TB24: 25.0000 mg | ORAL_TABLET | Freq: Every day | ORAL | Status: DC

## 2013-03-05 DIAGNOSIS — I219 Acute myocardial infarction, unspecified: Secondary | ICD-10-CM

## 2013-03-05 DIAGNOSIS — J90 Pleural effusion, not elsewhere classified: Secondary | ICD-10-CM

## 2013-03-05 HISTORY — DX: Pleural effusion, not elsewhere classified: J90

## 2013-03-05 HISTORY — PX: THORACENTESIS: SHX235

## 2013-03-05 HISTORY — DX: Acute myocardial infarction, unspecified: I21.9

## 2013-03-06 ENCOUNTER — Other Ambulatory Visit: Payer: BC Managed Care – PPO

## 2013-04-10 ENCOUNTER — Other Ambulatory Visit: Payer: Self-pay | Admitting: Adult Health

## 2013-04-10 ENCOUNTER — Other Ambulatory Visit (INDEPENDENT_AMBULATORY_CARE_PROVIDER_SITE_OTHER): Payer: BC Managed Care – PPO

## 2013-04-10 DIAGNOSIS — Z79899 Other long term (current) drug therapy: Secondary | ICD-10-CM

## 2013-04-10 DIAGNOSIS — E785 Hyperlipidemia, unspecified: Secondary | ICD-10-CM

## 2013-04-10 DIAGNOSIS — E119 Type 2 diabetes mellitus without complications: Secondary | ICD-10-CM

## 2013-04-10 DIAGNOSIS — I10 Essential (primary) hypertension: Secondary | ICD-10-CM

## 2013-04-10 LAB — LIPID PANEL
Cholesterol: 195 mg/dL (ref 0–200)
HDL: 63.8 mg/dL (ref >39.00)
LDL Cholesterol: 121 mg/dL — ABNORMAL HIGH (ref 0–99)
Total CHOL/HDL Ratio: 3
Triglycerides: 52 mg/dL (ref 0.0–149.0)
VLDL: 10.4 mg/dL (ref 0.0–40.0)

## 2013-04-10 LAB — BASIC METABOLIC PANEL
BUN: 34 mg/dL — ABNORMAL HIGH (ref 6–23)
CO2: 30 mEq/L (ref 19–32)
Calcium: 9.5 mg/dL (ref 8.4–10.5)
Chloride: 107 mEq/L (ref 96–112)
Creatinine, Ser: 1 mg/dL (ref 0.4–1.2)
GFR: 60.25 mL/min (ref >60.00)
Glucose, Bld: 81 mg/dL (ref 70–99)
Potassium: 5 mEq/L (ref 3.5–5.1)
Sodium: 140 mEq/L (ref 135–145)

## 2013-04-10 LAB — HEMOGLOBIN A1C: Hgb A1c MFr Bld: 6.7 % — ABNORMAL HIGH (ref 4.6–6.5)

## 2013-04-11 ENCOUNTER — Encounter: Payer: Self-pay | Admitting: Adult Health

## 2013-04-13 ENCOUNTER — Telehealth: Payer: Self-pay | Admitting: Adult Health

## 2013-04-13 NOTE — Telephone Encounter (Signed)
Mailed unread message to pt  

## 2013-04-13 NOTE — Telephone Encounter (Signed)
Relevant patient education assigned to patient using Emmi. ° °

## 2013-04-15 ENCOUNTER — Telehealth: Payer: Self-pay

## 2013-04-15 NOTE — Telephone Encounter (Signed)
Relevant patient education assigned to patient using Emmi. ° °

## 2013-04-20 ENCOUNTER — Other Ambulatory Visit: Payer: Self-pay | Admitting: *Deleted

## 2013-04-20 MED ORDER — "INSULIN SYRINGE 30G X 1/2"" 0.5 ML MISC": Status: DC

## 2013-04-20 NOTE — Telephone Encounter (Signed)
Pt's husband called , requesting samples of insulin syringes. Left samples up front for pickup, Rx also sent to pharmacy. Spouse aware.

## 2013-05-03 ENCOUNTER — Other Ambulatory Visit: Payer: Self-pay | Admitting: Adult Health

## 2013-05-14 ENCOUNTER — Other Ambulatory Visit: Payer: Self-pay | Admitting: Cardiovascular Disease

## 2013-05-15 ENCOUNTER — Other Ambulatory Visit: Payer: Self-pay | Admitting: *Deleted

## 2013-05-15 MED ORDER — LISINOPRIL 40 MG PO TABS: 40.0000 mg | ORAL_TABLET | Freq: Every day | ORAL | Status: DC

## 2013-05-15 NOTE — Telephone Encounter (Signed)
Requested Prescriptions   Signed Prescriptions Disp Refills  . lisinopril (PRINIVIL,ZESTRIL) 40 MG tablet 30 tablet 3    Sig: Take 1 tablet (40 mg total) by mouth daily.    Authorizing Provider: Kathlyn Sacramento A    Ordering User: Britt Bottom

## 2013-05-17 ENCOUNTER — Other Ambulatory Visit: Payer: Self-pay | Admitting: Adult Health

## 2013-05-22 ENCOUNTER — Telehealth: Payer: Self-pay | Admitting: Adult Health

## 2013-05-22 NOTE — Telephone Encounter (Signed)
States pt has been sick since Tuesday, unable to keep anything down.  Asking for advice on what medications she can take for her illness that will not interact with her current medication list.

## 2013-05-22 NOTE — Telephone Encounter (Signed)
If not able to keep anything down for 4 days - needs evaluation.

## 2013-05-22 NOTE — Telephone Encounter (Signed)
Spoke with husband, stated pt began with loose, frequent BMs on Tuesday, worse yesterday, but has improved today. Diarrhea x 1 today. Denies fever, nausea or vomiting. Pt is tolerating foods and liquids, but has diarrhea shortly after eating so is afraid to eat. States her sugars have been "normal", did not have a reading for me. Declined appt to be seen in Saturday clinic tomorrow. Please advise on need to be evaluated or home care. Raquel out of office this afternoon

## 2013-05-22 NOTE — Telephone Encounter (Signed)
Pt's husband notified for pt to be evaluated, states he misspoke earlier, she has had no diarrhea today. Would like to wait another day to see if she continues to improve, pt will continue with bland foods and liquids. Advised to be seen in Urgent Care over the weekend if does not continue to improve or symptoms worsen, pt's husband verbalized understanding.

## 2013-05-29 ENCOUNTER — Encounter: Payer: Self-pay | Admitting: Adult Health

## 2013-05-29 ENCOUNTER — Ambulatory Visit (INDEPENDENT_AMBULATORY_CARE_PROVIDER_SITE_OTHER): Payer: BC Managed Care – PPO | Admitting: Adult Health

## 2013-05-29 VITALS — BP 122/78 | HR 73 | Resp 16 | Ht 63.0 in | Wt 158.0 lb

## 2013-05-29 DIAGNOSIS — E785 Hyperlipidemia, unspecified: Secondary | ICD-10-CM

## 2013-05-29 DIAGNOSIS — E118 Type 2 diabetes mellitus with unspecified complications: Secondary | ICD-10-CM

## 2013-05-29 DIAGNOSIS — I10 Essential (primary) hypertension: Secondary | ICD-10-CM

## 2013-05-29 DIAGNOSIS — E039 Hypothyroidism, unspecified: Secondary | ICD-10-CM

## 2013-05-29 NOTE — Progress Notes (Signed)
[name], female   DOB: 1956-01-03, 58 y.o.   MRN: [id number]    Subjective:    [name], female    DOB: 10-14-1955, 58 y.o.   MRN: [id number]  HPI  Patient is a pleasant 58 year old female with history of diabetes type 2, hyperlipidemia, hypothyroidism and hypertension who presents for followup.   1. DM controlled with complications -reports that blood sugars are running between 110 and 112. She is feeling very well since her blood sugars are controlled. She has bilateral lower extremity neuropathy. Symptoms appear to be very well controlled. Patient is compliant with her medication  2. HLD - compliant with medication. Tries to also follow a low-fat, low-cholesterol diet.  3. HTN - patient is also compliant with medication for blood pressure. Blood pressures are well controlled. No side effects reported.  4. Hypothyroidism - doing well with her medication. She takes her medication first thing in the morning. No side effects reported.  Patient is due to have her physical exam. She reports recently changing insurance and this has complicated matters for her. She will call to schedule appointment once she is able to straighten out the issues with her insurance.  Past Medical History  Diagnosis Date  . Diabetes mellitus without complication   . Hypertension   . Hyperlipidemia   . Hypothyroidism   . Diabetic neuropathy     Current Outpatient Prescriptions on File Prior to Visit  Medication Sig Dispense Refill  . BAYER ASPIRIN EC LOW DOSE PO Take 81 mg by mouth daily.      . diazepam (VALIUM) 5 MG tablet Take 1 tablet (5 mg total) by mouth every 8 (eight) hours as needed for anxiety.  30 tablet  1  . gabapentin (NEURONTIN) 300 MG capsule Take 1 capsule (300 mg total) by mouth 3 (three) times daily.  90 capsule  3  . glucose blood test strip Please dispense Contour next EZ test strips. She is testing 4 times a day. Diagnosis code 250.0  120 each  12  .  insulin detemir (LEVEMIR) 100 UNIT/ML injection Inject 10 Units into the skin at bedtime.  10 mL  12  . Insulin Pen Needle 32G X 6 MM MISC Dispense 1 box of 50 insulin pen needles.  50 each  0  . Insulin Syringe-Needle U-100 (INSULIN SYRINGE .5CC/30GX1/2") 30G X 1/2" 0.5 ML MISC Use as directed  100 each  5  . Lancets (FREESTYLE) lancets USE TO TEST BLOOD SUGAR FOUR TIMES DAILY  100 each  5  . levothyroxine (SYNTHROID, LEVOTHROID) 50 MCG tablet Take 50 mcg by mouth daily.      Marland Kitchen lisinopril (PRINIVIL,ZESTRIL) 40 MG tablet TAKE ONE TABLET BY MOUTH ONCE DAILY  30 tablet  0  . metFORMIN (GLUCOPHAGE) 500 MG tablet TAKE ONE TABLET BY MOUTH TWICE DAILY WITH MEALS  60 tablet  5  . metoprolol succinate (TOPROL XL) 25 MG 24 hr tablet Take 1 tablet (25 mg total) by mouth daily.  30 tablet  5  . simvastatin (ZOCOR) 10 MG tablet Take 1 tablet (10 mg total) by mouth at bedtime.  30 tablet  3  . traZODone (DESYREL) 100 MG tablet Take 1 tablet (100 mg total) by mouth at bedtime.  30 tablet  6   No current facility-administered medications on file prior to visit.     Review of Systems  Constitutional: Negative.   HENT: Negative.   Eyes: Negative.   Respiratory: Negative.   Cardiovascular: Negative.  Gastrointestinal: Negative.   Endocrine: Negative.   Genitourinary: Negative.   Musculoskeletal: Negative.   Skin: Negative.   Allergic/Immunologic: Negative.   Neurological: Negative.   Hematological: Negative.   Psychiatric/Behavioral: Negative.        Objective:  BP 122/78  Pulse 73  Resp 16  Ht 5\' 3"  (1.6 m)  Wt 158 lb (71.668 kg)  BMI 28.00 kg/m2  SpO2 99%   Physical Exam  Constitutional: She is oriented to person, place, and time. She appears well-developed and well-nourished. No distress.  HENT:  Head: Normocephalic and atraumatic.  Eyes: Conjunctivae and EOM are normal.  Neck: Normal range of motion. Neck supple. No tracheal deviation present.  Cardiovascular: Normal rate, regular  rhythm, normal heart sounds and intact distal pulses.  Exam reveals no gallop and no friction rub.   No murmur heard. Pulmonary/Chest: Effort normal and breath sounds normal. No respiratory distress. She has no wheezes. She has no rales.  Abdominal: Soft. Bowel sounds are normal.  Musculoskeletal: Normal range of motion. She exhibits no edema and no tenderness.  Neurological: She is alert and oriented to person, place, and time. She has normal reflexes. Coordination normal.  Skin: Skin is warm and dry.  Psychiatric: She has a normal mood and affect. Her behavior is normal. Judgment and thought content normal.          Assessment & Plan:   1. DM type 2, controlled, with complication Continue medications as prescribed. Check hemoglobin A1c. - Hemoglobin A1c; Future  2. HLD (hyperlipidemia) On medication. Check lipids. Continue to follow - Lipid panel  3. HTN (hypertension) Very well controlled on medication. Check labs. Continue to follow - Basic metabolic panel  4. Hypothyroidism On Synthroid. Check TSH. Continue to follow. - TSH

## 2013-05-29 NOTE — Progress Notes (Signed)
Pre visit review using our clinic review tool, if applicable. No additional management support is needed unless otherwise documented below in the visit note. 

## 2013-06-11 ENCOUNTER — Telehealth: Payer: Self-pay

## 2013-06-11 NOTE — Telephone Encounter (Signed)
Relevant patient education assigned to patient using Emmi. ° °

## 2013-06-12 ENCOUNTER — Other Ambulatory Visit (INDEPENDENT_AMBULATORY_CARE_PROVIDER_SITE_OTHER): Payer: Self-pay

## 2013-06-12 ENCOUNTER — Encounter: Payer: Self-pay | Admitting: Adult Health

## 2013-06-12 DIAGNOSIS — E785 Hyperlipidemia, unspecified: Secondary | ICD-10-CM

## 2013-06-12 DIAGNOSIS — I10 Essential (primary) hypertension: Secondary | ICD-10-CM

## 2013-06-12 DIAGNOSIS — E039 Hypothyroidism, unspecified: Secondary | ICD-10-CM

## 2013-06-12 DIAGNOSIS — E118 Type 2 diabetes mellitus with unspecified complications: Secondary | ICD-10-CM

## 2013-06-12 LAB — BASIC METABOLIC PANEL
BUN: 24 mg/dL — ABNORMAL HIGH (ref 6–23)
CO2: 28 mEq/L (ref 19–32)
Calcium: 9.4 mg/dL (ref 8.4–10.5)
Chloride: 107 mEq/L (ref 96–112)
Creatinine, Ser: 1.1 mg/dL (ref 0.4–1.2)
GFR: 56.91 mL/min — ABNORMAL LOW (ref >60.00)
Glucose, Bld: 124 mg/dL — ABNORMAL HIGH (ref 70–99)
Potassium: 5.3 mEq/L — ABNORMAL HIGH (ref 3.5–5.1)
Sodium: 141 mEq/L (ref 135–145)

## 2013-06-12 LAB — HEMOGLOBIN A1C: Hgb A1c MFr Bld: 7.2 % — ABNORMAL HIGH (ref 4.6–6.5)

## 2013-06-12 LAB — TSH: TSH: 4.46 u[IU]/mL (ref 0.35–5.50)

## 2013-06-12 LAB — LIPID PANEL
Cholesterol: 211 mg/dL — ABNORMAL HIGH (ref 0–200)
HDL: 65.3 mg/dL (ref >39.00)
LDL Cholesterol: 133 mg/dL — ABNORMAL HIGH (ref 0–99)
Total CHOL/HDL Ratio: 3
Triglycerides: 62 mg/dL (ref 0.0–149.0)
VLDL: 12.4 mg/dL (ref 0.0–40.0)

## 2013-06-15 NOTE — Telephone Encounter (Signed)
Called and left message, notifying pt of results and instructions.

## 2013-06-22 ENCOUNTER — Other Ambulatory Visit: Payer: Self-pay | Admitting: *Deleted

## 2013-06-22 MED ORDER — INSULIN PEN NEEDLE 32G X 6 MM MISC: Status: DC

## 2013-06-22 NOTE — Telephone Encounter (Signed)
Pt husband requested refills on insulin needles

## 2013-06-23 ENCOUNTER — Other Ambulatory Visit: Payer: Self-pay | Admitting: *Deleted

## 2013-06-23 ENCOUNTER — Telehealth: Payer: Self-pay | Admitting: *Deleted

## 2013-06-23 MED ORDER — "INSULIN SYRINGE 30G X 1/2"" 0.5 ML MISC": Status: DC

## 2013-06-23 NOTE — Telephone Encounter (Signed)
Refilled syringes already, see refill encounter

## 2013-06-23 NOTE — Telephone Encounter (Signed)
Pharmacy Note:  Pt does not use pen needles, we need rx for syringes

## 2013-06-25 ENCOUNTER — Telehealth: Payer: Self-pay

## 2013-06-25 NOTE — Telephone Encounter (Signed)
Notified Shravani Eun (husband) that I spoke with [facility] pharmacy about wife's insulin syringes. The issue was fixed and the syringes are ready for pick up. Mr. Sastre verbalized understanding and stated that he would pick them up this afternoon.

## 2013-06-29 ENCOUNTER — Inpatient Hospital Stay (HOSPITAL_COMMUNITY)
Admission: AD | Admit: 2013-06-29 | Discharge: 2013-07-14 | DRG: 235 | Disposition: A | Discharge status: Home / Self Care | Payer: Self-pay | Source: Other Acute Inpatient Hospital | Attending: Cardiothoracic Surgery | Admitting: Cardiothoracic Surgery

## 2013-06-29 ENCOUNTER — Inpatient Hospital Stay: Payer: Self-pay | Admitting: Internal Medicine

## 2013-06-29 ENCOUNTER — Encounter: Payer: Self-pay | Admitting: Cardiovascular Disease

## 2013-06-29 DIAGNOSIS — E785 Hyperlipidemia, unspecified: Secondary | ICD-10-CM | POA: Diagnosis present

## 2013-06-29 DIAGNOSIS — I059 Rheumatic mitral valve disease, unspecified: Secondary | ICD-10-CM

## 2013-06-29 DIAGNOSIS — E1169 Type 2 diabetes mellitus with other specified complication: Secondary | ICD-10-CM | POA: Diagnosis present

## 2013-06-29 DIAGNOSIS — I5021 Acute systolic (congestive) heart failure: Secondary | ICD-10-CM

## 2013-06-29 DIAGNOSIS — I4891 Unspecified atrial fibrillation: Secondary | ICD-10-CM | POA: Diagnosis not present

## 2013-06-29 DIAGNOSIS — I951 Orthostatic hypotension: Secondary | ICD-10-CM | POA: Diagnosis not present

## 2013-06-29 DIAGNOSIS — Z833 Family history of diabetes mellitus: Secondary | ICD-10-CM

## 2013-06-29 DIAGNOSIS — N179 Acute kidney failure, unspecified: Secondary | ICD-10-CM | POA: Diagnosis not present

## 2013-06-29 DIAGNOSIS — E039 Hypothyroidism, unspecified: Secondary | ICD-10-CM | POA: Diagnosis present

## 2013-06-29 DIAGNOSIS — I2582 Chronic total occlusion of coronary artery: Secondary | ICD-10-CM | POA: Diagnosis present

## 2013-06-29 DIAGNOSIS — I214 Non-ST elevation (NSTEMI) myocardial infarction: Principal | ICD-10-CM | POA: Diagnosis present

## 2013-06-29 DIAGNOSIS — E875 Hyperkalemia: Secondary | ICD-10-CM | POA: Diagnosis not present

## 2013-06-29 DIAGNOSIS — I10 Essential (primary) hypertension: Secondary | ICD-10-CM | POA: Diagnosis present

## 2013-06-29 DIAGNOSIS — E1149 Type 2 diabetes mellitus with other diabetic neurological complication: Secondary | ICD-10-CM | POA: Diagnosis present

## 2013-06-29 DIAGNOSIS — Z6827 Body mass index (BMI) 27.0-27.9, adult: Secondary | ICD-10-CM

## 2013-06-29 DIAGNOSIS — I2589 Other forms of chronic ischemic heart disease: Secondary | ICD-10-CM | POA: Diagnosis present

## 2013-06-29 DIAGNOSIS — J9819 Other pulmonary collapse: Secondary | ICD-10-CM | POA: Diagnosis not present

## 2013-06-29 DIAGNOSIS — I2510 Atherosclerotic heart disease of native coronary artery without angina pectoris: Secondary | ICD-10-CM | POA: Diagnosis present

## 2013-06-29 DIAGNOSIS — J90 Pleural effusion, not elsewhere classified: Secondary | ICD-10-CM | POA: Diagnosis not present

## 2013-06-29 DIAGNOSIS — D62 Acute posthemorrhagic anemia: Secondary | ICD-10-CM | POA: Diagnosis not present

## 2013-06-29 DIAGNOSIS — I509 Heart failure, unspecified: Secondary | ICD-10-CM | POA: Diagnosis present

## 2013-06-29 DIAGNOSIS — Z794 Long term (current) use of insulin: Secondary | ICD-10-CM

## 2013-06-29 DIAGNOSIS — K5900 Constipation, unspecified: Secondary | ICD-10-CM | POA: Diagnosis not present

## 2013-06-29 DIAGNOSIS — E1142 Type 2 diabetes mellitus with diabetic polyneuropathy: Secondary | ICD-10-CM | POA: Diagnosis present

## 2013-06-29 LAB — COMPREHENSIVE METABOLIC PANEL
Albumin: 3.3 g/dL — ABNORMAL LOW (ref 3.4–5.0)
Alkaline Phosphatase: 84 U/L
Anion Gap: 10 (ref 7–16)
BUN: 37 mg/dL — ABNORMAL HIGH (ref 7–18)
Bilirubin,Total: 0.5 mg/dL (ref 0.2–1.0)
Calcium, Total: 9.6 mg/dL (ref 8.5–10.1)
Chloride: 105 mmol/L (ref 98–107)
Co2: 22 mmol/L (ref 21–32)
Creatinine: 1.09 mg/dL (ref 0.60–1.30)
EGFR (African American): >60
EGFR (Non-African Amer.): 56 — ABNORMAL LOW
Glucose: 245 mg/dL — ABNORMAL HIGH (ref 65–99)
Osmolality: 291 (ref 275–301)
Potassium: 4.5 mmol/L (ref 3.5–5.1)
SGOT(AST): 33 U/L (ref 15–37)
SGPT (ALT): 26 U/L (ref 12–78)
Sodium: 137 mmol/L (ref 136–145)
Total Protein: 7.7 g/dL (ref 6.4–8.2)

## 2013-06-29 LAB — CK-MB
CK-MB: 15 ng/mL — ABNORMAL HIGH (ref 0.5–3.6)
CK-MB: 15 ng/mL — ABNORMAL HIGH (ref 0.5–3.6)

## 2013-06-29 LAB — CBC WITH DIFFERENTIAL/PLATELET
Basophil #: 0 10*3/uL (ref 0.0–0.1)
Basophil %: 0.5 %
Eosinophil #: 0 10*3/uL (ref 0.0–0.7)
Eosinophil %: 0 %
HCT: 37.6 % (ref 35.0–47.0)
HGB: 12.6 g/dL (ref 12.0–16.0)
Lymphocyte #: 0.7 10*3/uL — ABNORMAL LOW (ref 1.0–3.6)
Lymphocyte %: 7.7 %
MCH: 29.3 pg (ref 26.0–34.0)
MCHC: 33.4 g/dL (ref 32.0–36.0)
MCV: 88 fL (ref 80–100)
Monocyte #: 0.3 x10 3/mm (ref 0.2–0.9)
Monocyte %: 2.7 %
Neutrophil #: 8.6 10*3/uL — ABNORMAL HIGH (ref 1.4–6.5)
Neutrophil %: 89.1 %
Platelet: 217 10*3/uL (ref 150–440)
RBC: 4.29 10*6/uL (ref 3.80–5.20)
RDW: 14.5 % (ref 11.5–14.5)
WBC: 9.7 10*3/uL (ref 3.6–11.0)

## 2013-06-29 LAB — TROPONIN I
Troponin-I: 2 ng/mL — ABNORMAL HIGH
Troponin-I: 6 ng/mL — ABNORMAL HIGH
Troponin-I: 7.2 ng/mL — ABNORMAL HIGH

## 2013-06-29 LAB — CK TOTAL AND CKMB (NOT AT ARMC)
CK, Total: 223 U/L — ABNORMAL HIGH
CK-MB: 8.8 ng/mL — ABNORMAL HIGH (ref 0.5–3.6)

## 2013-06-29 LAB — LIPASE, BLOOD: Lipase: 74 U/L (ref 73–393)

## 2013-06-29 LAB — PRO B NATRIURETIC PEPTIDE: B-Type Natriuretic Peptide: 9156 pg/mL — ABNORMAL HIGH (ref 0–125)

## 2013-06-29 LAB — APTT
Activated PTT: 28.3 secs (ref 23.6–35.9)
Activated PTT: 61 secs — ABNORMAL HIGH (ref 23.6–35.9)

## 2013-06-29 MED ORDER — ASPIRIN EC 81 MG PO TBEC
81.0000 mg | DELAYED_RELEASE_TABLET | Freq: Every day | ORAL | Status: DC
  Administered 2013-06-30 – 2013-07-01 (×2): 81 mg via ORAL
  Filled 2013-06-29 (×3): qty 1

## 2013-06-29 MED ORDER — INSULIN ASPART 100 UNIT/ML ~~LOC~~ SOLN: 0.0000 [IU] | Freq: Three times a day (TID) | SUBCUTANEOUS | Status: DC

## 2013-06-29 MED ORDER — ACETAMINOPHEN 325 MG PO TABS: 650.0000 mg | ORAL_TABLET | ORAL | Status: DC | PRN

## 2013-06-29 MED ORDER — LISINOPRIL 20 MG PO TABS
20.0000 mg | ORAL_TABLET | Freq: Every day | ORAL | Status: DC
  Administered 2013-06-30 – 2013-07-01 (×2): 20 mg via ORAL
  Filled 2013-06-29 (×3): qty 1

## 2013-06-29 MED ORDER — ONDANSETRON HCL 4 MG/2ML IJ SOLN
4.0000 mg | Freq: Four times a day (QID) | INTRAMUSCULAR | Status: DC | PRN
  Administered 2013-06-30: 4 mg via INTRAVENOUS
  Filled 2013-06-29: qty 2

## 2013-06-29 MED ORDER — SODIUM CHLORIDE 0.9 % IV SOLN
250.0000 mL | INTRAVENOUS | Status: DC | PRN
  Administered 2013-07-01: 250 mL via INTRAVENOUS

## 2013-06-29 MED ORDER — CARVEDILOL 6.25 MG PO TABS
6.2500 mg | ORAL_TABLET | Freq: Two times a day (BID) | ORAL | Status: DC
  Administered 2013-06-30 – 2013-07-01 (×4): 6.25 mg via ORAL
  Filled 2013-06-29 (×7): qty 1

## 2013-06-29 MED ORDER — ATORVASTATIN CALCIUM 80 MG PO TABS
80.0000 mg | ORAL_TABLET | Freq: Every day | ORAL | Status: DC
  Administered 2013-06-30 – 2013-07-13 (×13): 80 mg via ORAL
  Filled 2013-06-29 (×15): qty 1

## 2013-06-29 MED ORDER — HEPARIN (PORCINE) IN NACL 100-0.45 UNIT/ML-% IJ SOLN
1300.0000 [IU]/h | INTRAMUSCULAR | Status: DC
  Administered 2013-06-29: 950 [IU]/h via INTRAVENOUS
  Administered 2013-06-30 – 2013-07-01 (×2): 1300 [IU]/h via INTRAVENOUS
  Filled 2013-06-29 (×6): qty 250

## 2013-06-29 MED ORDER — NITROGLYCERIN 0.4 MG SL SUBL: 0.4000 mg | SUBLINGUAL_TABLET | SUBLINGUAL | Status: DC | PRN

## 2013-06-29 MED ORDER — FUROSEMIDE 10 MG/ML IJ SOLN
40.0000 mg | Freq: Two times a day (BID) | INTRAMUSCULAR | Status: DC
  Administered 2013-06-29 – 2013-07-01 (×4): 40 mg via INTRAVENOUS
  Filled 2013-06-29 (×6): qty 4

## 2013-06-29 MED ORDER — SODIUM CHLORIDE 0.9 % IJ SOLN: 3.0000 mL | INTRAMUSCULAR | Status: DC | PRN

## 2013-06-29 MED ORDER — INSULIN ASPART 100 UNIT/ML ~~LOC~~ SOLN
0.0000 [IU] | Freq: Every day | SUBCUTANEOUS | Status: DC
  Administered 2013-06-29: 2 [IU] via SUBCUTANEOUS

## 2013-06-29 MED ORDER — ASPIRIN 300 MG RE SUPP
300.0000 mg | RECTAL | Status: AC
  Filled 2013-06-29: qty 1

## 2013-06-29 MED ORDER — ASPIRIN 81 MG PO CHEW
324.0000 mg | CHEWABLE_TABLET | ORAL | Status: AC
  Administered 2013-06-29: 324 mg via ORAL
  Filled 2013-06-29 (×2): qty 4

## 2013-06-29 MED ORDER — SODIUM CHLORIDE 0.9 % IJ SOLN
3.0000 mL | Freq: Two times a day (BID) | INTRAMUSCULAR | Status: DC
  Administered 2013-06-29 – 2013-07-01 (×4): 3 mL via INTRAVENOUS

## 2013-06-29 NOTE — Progress Notes (Signed)
ANTICOAGULATION CONSULT NOTE - Initial Consult  Pharmacy Consult for Heparin Indication: severe 3VCAD  No Known Allergies  Patient Measurements: Height: 5\' 4"  (162.6 cm) Weight: 165 lb 5.5 oz (75 kg) IBW/kg (Calculated) : 54.7 Heparin Dosing Weight: 70 kg  Vital Signs: Temp: 98.6 F (37 C) (04/27 2046) Temp src: Oral (04/27 2046) BP: 145/74 mmHg (04/27 2145)  Labs: No results found for this basename: HGB, HCT, PLT, APTT, LABPROT, INR, HEPARINUNFRC, CREATININE, CKTOTAL, CKMB, TROPONINI,  in the last 72 hours  Estimated Creatinine Clearance: 54.6 ml/min (by C-G formula based on Cr of 1.1).   Medical History: Past Medical History  Diagnosis Date  . Diabetes mellitus without complication   . Hypertension   . Hyperlipidemia   . Hypothyroidism   . Diabetic neuropathy     Medications:  Prescriptions prior to admission  Medication Sig Dispense Refill  . BAYER ASPIRIN EC LOW DOSE PO Take 81 mg by mouth daily.      . diazepam (VALIUM) 5 MG tablet Take 1 tablet (5 mg total) by mouth every 8 (eight) hours as needed for anxiety.  30 tablet  1  . gabapentin (NEURONTIN) 300 MG capsule Take 1 capsule (300 mg total) by mouth 3 (three) times daily.  90 capsule  3  . glucose blood test strip Please dispense Contour next EZ test strips. She is testing 4 times a day. Diagnosis code 250.0  120 each  12  . insulin detemir (LEVEMIR) 100 UNIT/ML injection Inject 10 Units into the skin at bedtime.  10 mL  12  . Insulin Syringe-Needle U-100 (INSULIN SYRINGE .5CC/30GX1/2") 30G X 1/2" 0.5 ML MISC Use as directed  100 each  5  . Lancets (FREESTYLE) lancets USE TO TEST BLOOD SUGAR FOUR TIMES DAILY  100 each  5  . levothyroxine (SYNTHROID, LEVOTHROID) 50 MCG tablet Take 50 mcg by mouth daily.      Marland Kitchen lisinopril (PRINIVIL,ZESTRIL) 40 MG tablet TAKE ONE TABLET BY MOUTH ONCE DAILY  30 tablet  0  . metFORMIN (GLUCOPHAGE) 500 MG tablet TAKE ONE TABLET BY MOUTH TWICE DAILY WITH MEALS  60 tablet  5  .  metoprolol succinate (TOPROL XL) 25 MG 24 hr tablet Take 1 tablet (25 mg total) by mouth daily.  30 tablet  5  . simvastatin (ZOCOR) 10 MG tablet Take 1 tablet (10 mg total) by mouth at bedtime.  30 tablet  3  . traZODone (DESYREL) 100 MG tablet Take 1 tablet (100 mg total) by mouth at bedtime.  30 tablet  6    Assessment: 58 y/o F transferred from [facility] admitted with CHF/pulmonary edema, CP and significantly elevated troponins. Currently s/p cath with EF 30% and 3VCAD.  Order to start IV heparin specified 8 hrs after cath which was estimated by RN around 1300.Patient had a TR band which was removed prior to leaving [place] but RN does not know when.  PMH: HTN, HLD, DM Labs: last on 06/12/13 with Scr 1.1, A1C 7.2   Goal of Therapy:  Heparin level 0.3-0.7 units/ml Monitor platelets by anticoagulation protocol: Yes   Plan:  Start IV heparin now, no bolus, at 950 units/hr Check heparin level in 6 hrs Heparin level and CBC daily.   Fumio Vandam S. Alford Highland, PharmD, [facility] Clinical Staff Pharmacist Pager [phone]  Wayland Salinas 06/29/2013,9:57 PM

## 2013-06-29 NOTE — Progress Notes (Signed)
[name], female   DOB: Oct 28, 1955, 58 y.o.   MRN: [id number]  Patient accepted in transfer from [facility].  She was admitted with CHF/pulmonary edema and chest pain.  Troponin was elevated to 7.2 consistent with NSTEMI.  CXR showed pulmonary edema.  Symptoms began Friday with cough/chest pain.  She felt "bad" on Saturday.  On Sunday evening, she again developed chest pain and became progressive more short of breath.  She was admitted to [facility] today and was noted to have pulmonary edema on CXR with elevated troponin.  She initially required Bipap.  BNP 9156.  Echo at [facility] showed EF 35-40%.  LHC showed EF 30% with 3 vessel severe CAD (pLAD 70%, mLAD 95%, pLCx 99%, dRCA 99%).  MR was moderate by echo, mild by LV-gram.  She was transferred on [facility] for CVTS evaluation and CABG.  ECG showed NSR, poor RWP, anterolateral slight ST depression.  - Restart heparin gtt 8 hrs after cath. - ASA 81, statin.  - She is still volume overloaded on exam.  Will write for Lasix 40 mg IV bid.  - Coreg 6.25 mg bid, lisinopril 5 mg daily.   Larey Dresser 06/29/2013 9:49 PM

## 2013-06-29 NOTE — H&P (Signed)
PATIENT NAME:  Jamie Arias, Jamie Arias MR#:  [id number] DATE OF BIRTH:  02-06-1956  DATE OF CONSULTATION:  06/29/2013  REFERRING PHYSICIAN:  Dr. Margaretmary Eddy CONSULTING PHYSICIAN:  Rogue Jury A. Fletcher Anon, MD  PRIMARY CARE PHYSICIAN:  Charolette Forward, FNP, with [facility].   REASON FOR CONSULTATION: Myocardial infarction.   HISTORY OF PRESENT ILLNESS: This is a 58 year old pleasant female with past medical history of hypertension, hyperlipidemia and type 2 diabetes. She presented to the Emergency Room with severe shortness of breath, orthopnea and positional cough when she tries to lie down. She actually started having lower substernal chest pain on Friday which lasted for about one hour. She felt better by Saturday, but she noticed difficulty breathing at the time due to shortness of breath. She had a prolonged episode of lower substernal chest tightness last night. This morning, she became acutely dyspneic and presented to the Emergency Room where she was found to be significantly hypoxic with an oxygen saturation of 68%. BNP was 9000 and initial troponin was 2, which subsequently increased to 7.2. She was given IV Lasix with improvement in symptoms. She was diagnosed with pneumonia and started on Rocephin and Zithromax. However, the cough has been mostly dry. She has no fever. The patient was seen by me last year for dizziness. Echocardiogram at that time showed normal LV systolic function. She had no anginal symptoms at that time.   PAST MEDICAL HISTORY: Includes hypertension, type 2 diabetes, and hyperlipidemia.   PAST SURGICAL HISTORY: None.   ALLERGIES: No known drug allergies.   SOCIAL HISTORY: Negative for smoking, alcohol or recreational drug use.   FAMILY HISTORY: Remarkable for diabetes.   HOME MEDICATIONS:  Include simvastatin 10 mg daily, metoprolol once a day, gabapentin 300 mg once daily, insulin Lantus 12 units once daily and hydrochlorothiazide 12.5 mg once daily.  REVIEW OF SYSTEMS: A  10-point review of systems was performed. It is negative other than what is mentioned in the HPI.   PHYSICAL EXAMINATION: GENERAL: The patient appears to be older than her stated age. Currently, she is in no acute distress.  VITAL SIGNS: On presentation, temperature is 96.7, pulse 86, respiratory rate 18. Blood pressure is 173/90 and oxygen saturation is 93% on 3 liters nasal cannula.  HEENT: Normocephalic, atraumatic.  NECK: Mild JVD with no carotid bruits.  RESPIRATORY: Normal respiratory effort with a few bibasilar crackles.  CARDIOVASCULAR: Normal PMI. Normal S1 and S2 with no gallops or murmurs.  ABDOMEN: Benign, nontender and nondistended.  EXTREMITIES: No clubbing, cyanosis or edema.  SKIN: Warm and dry with no rash.  PSYCHIATRIC: She is alert, oriented x 3 with normal mood and affect.   LABORATORY, DIAGNOSTIC, AND RADIOLOGICAL DATA: Chest x-ray showed vascular congestion with mild cardiomegaly and small pleural effusion. BNP was 9000. BUN was 37 with a creatinine of 1.09. Troponin was 2 and subsequently increased to 7.2. CK-MB was 15.  ECG showed sinus rhythm with possible old inferior infarct and poor R wave progression in the anterior leads.   IMPRESSION: 1.  Acute systolic heart failure, likely due to ischemic cardiomyopathy.  2.  Non-ST-elevation myocardial infarction.  3.  Hypertension.  4.  Hyperlipidemia.  5.  Type 2 diabetes.   RECOMMENDATIONS: The patient's history is very concerning. Based on her symptoms, I suspect that she had myocardial infarction on Friday. That was the day that she started having chest discomfort, but she did not seek medical attention. After that, she started having symptoms highly suggestive of new onset heart  failure. She also reports recurrent severe tightness last night, which is concerning for ongoing ischemic event. I proceeded with an echocardiogram which showed an ejection fraction of 35% to 40% with severe inferior, inferoseptal,  inferolateral hypokinesis with moderate mitral regurgitation, aortic sclerosis without significant stenosis and moderately elevated pulmonary pressure. She has improved significantly with IV Lasix and currently she is no longer orthopneic. Thus, I think the best option is to proceed with urgent cardiac catheterization and possible coronary intervention. Risks, benefits and alternatives were discussed with the patient. Further recommendations to follow after cardiac catheterization. I do not think that this patient has pneumonia. The cough and dyspnea is likely related to heart failure.   ____________________________ Mertie Clause. Fletcher Anon, MD maa:ce D: 06/29/2013 17:26:32 ET T: 06/29/2013 17:41:57 ET JOB#: [id number]  cc: Rogue Jury A. Fletcher Anon, MD, <Dictator> Nicholes Mango, MD Charolette Forward, FNP     Addendum: cardiac cath showed severe 3 vessel CAD with an EF of 30-35% with mild MR (by LV angiography). Both RCA and LCX with 99% and LAD with 95% diffuse mid disease and 95% in large diagonal. LVEDP was 24 mm Hg.  Plan: urgent CABG. Resume Heparin at 10 pm.

## 2013-06-30 ENCOUNTER — Inpatient Hospital Stay (HOSPITAL_COMMUNITY): Payer: No Typology Code available for payment source

## 2013-06-30 ENCOUNTER — Encounter: Payer: Self-pay | Admitting: Cardiovascular Disease

## 2013-06-30 ENCOUNTER — Inpatient Hospital Stay (HOSPITAL_COMMUNITY): Payer: Self-pay

## 2013-06-30 ENCOUNTER — Other Ambulatory Visit: Payer: Self-pay | Admitting: *Deleted

## 2013-06-30 ENCOUNTER — Encounter (HOSPITAL_COMMUNITY): Payer: Self-pay | Admitting: *Deleted

## 2013-06-30 DIAGNOSIS — Z01810 Encounter for preprocedural cardiovascular examination: Secondary | ICD-10-CM

## 2013-06-30 DIAGNOSIS — I2510 Atherosclerotic heart disease of native coronary artery without angina pectoris: Secondary | ICD-10-CM

## 2013-06-30 DIAGNOSIS — I5021 Acute systolic (congestive) heart failure: Secondary | ICD-10-CM | POA: Diagnosis present

## 2013-06-30 DIAGNOSIS — I214 Non-ST elevation (NSTEMI) myocardial infarction: Secondary | ICD-10-CM

## 2013-06-30 LAB — GLUCOSE, CAPILLARY
Glucose-Capillary: 248 mg/dL — ABNORMAL HIGH (ref 70–99)
Glucose-Capillary: 197 mg/dL — ABNORMAL HIGH (ref 70–99)
Glucose-Capillary: 129 mg/dL — ABNORMAL HIGH (ref 70–99)
Glucose-Capillary: 222 mg/dL — ABNORMAL HIGH (ref 70–99)
Glucose-Capillary: 165 mg/dL — ABNORMAL HIGH (ref 70–99)

## 2013-06-30 LAB — BASIC METABOLIC PANEL
BUN: 32 mg/dL — ABNORMAL HIGH (ref 6–23)
CO2: 25 mEq/L (ref 19–32)
Calcium: 9.3 mg/dL (ref 8.4–10.5)
Chloride: 98 mEq/L (ref 96–112)
Creatinine, Ser: 1.1 mg/dL (ref 0.50–1.10)
GFR calc Af Amer: 62 mL/min — ABNORMAL LOW (ref >90)
GFR calc non Af Amer: 54 mL/min — ABNORMAL LOW (ref >90)
Glucose, Bld: 255 mg/dL — ABNORMAL HIGH (ref 70–99)
Potassium: 4.1 mEq/L (ref 3.7–5.3)
Sodium: 137 mEq/L (ref 137–147)

## 2013-06-30 LAB — POCT I-STAT 3, ART BLOOD GAS (G3+)
Acid-Base Excess: 6 mmol/L — ABNORMAL HIGH (ref 0.0–2.0)
Bicarbonate: 27.8 mEq/L — ABNORMAL HIGH (ref 20.0–24.0)
O2 Saturation: 96 %
Patient temperature: 98.6
TCO2: 29 mmol/L (ref 0–100)
pCO2 arterial: 31.5 mmHg — ABNORMAL LOW (ref 35.0–45.0)
pH, Arterial: 7.554 — ABNORMAL HIGH (ref 7.350–7.450)
pO2, Arterial: 71 mmHg — ABNORMAL LOW (ref 80.0–100.0)

## 2013-06-30 LAB — URINALYSIS, ROUTINE W REFLEX MICROSCOPIC
Bilirubin Urine: NEGATIVE
Glucose, UA: NEGATIVE mg/dL
Hgb urine dipstick: NEGATIVE
Ketones, ur: NEGATIVE mg/dL
Leukocytes, UA: NEGATIVE
Nitrite: NEGATIVE
Protein, ur: NEGATIVE mg/dL
Specific Gravity, Urine: 1.012 (ref 1.005–1.030)
Urobilinogen, UA: 0.2 mg/dL (ref 0.0–1.0)
pH: 5 (ref 5.0–8.0)

## 2013-06-30 LAB — COMPREHENSIVE METABOLIC PANEL
ALT: 17 U/L (ref 0–35)
AST: 32 U/L (ref 0–37)
Albumin: 3.1 g/dL — ABNORMAL LOW (ref 3.5–5.2)
Alkaline Phosphatase: 59 U/L (ref 39–117)
BUN: 28 mg/dL — ABNORMAL HIGH (ref 6–23)
CO2: 22 mEq/L (ref 19–32)
Calcium: 9.2 mg/dL (ref 8.4–10.5)
Chloride: 100 mEq/L (ref 96–112)
Creatinine, Ser: 0.97 mg/dL (ref 0.50–1.10)
GFR calc Af Amer: 73 mL/min — ABNORMAL LOW (ref >90)
GFR calc non Af Amer: 63 mL/min — ABNORMAL LOW (ref >90)
Glucose, Bld: 154 mg/dL — ABNORMAL HIGH (ref 70–99)
Potassium: 3.9 mEq/L (ref 3.7–5.3)
Sodium: 138 mEq/L (ref 137–147)
Total Bilirubin: 0.4 mg/dL (ref 0.3–1.2)
Total Protein: 6.6 g/dL (ref 6.0–8.3)

## 2013-06-30 LAB — LIPID PANEL
Cholesterol: 220 mg/dL — ABNORMAL HIGH (ref 0–200)
HDL: 80 mg/dL (ref >39)
LDL Cholesterol: 123 mg/dL — ABNORMAL HIGH (ref 0–99)
Total CHOL/HDL Ratio: 2.8 RATIO
Triglycerides: 87 mg/dL (ref <150)
VLDL: 17 mg/dL (ref 0–40)

## 2013-06-30 LAB — CBC
HCT: 31.6 % — ABNORMAL LOW (ref 36.0–46.0)
Hemoglobin: 10.6 g/dL — ABNORMAL LOW (ref 12.0–15.0)
MCH: 28.8 pg (ref 26.0–34.0)
MCHC: 33.5 g/dL (ref 30.0–36.0)
MCV: 85.9 fL (ref 78.0–100.0)
Platelets: 197 10*3/uL (ref 150–400)
RBC: 3.68 MIL/uL — ABNORMAL LOW (ref 3.87–5.11)
RDW: 13.7 % (ref 11.5–15.5)
WBC: 9.3 10*3/uL (ref 4.0–10.5)

## 2013-06-30 LAB — TSH: TSH: 4.38 u[IU]/mL (ref 0.350–4.500)

## 2013-06-30 LAB — TROPONIN I: Troponin I: 3.36 ng/mL (ref <0.30)

## 2013-06-30 LAB — HEPARIN LEVEL (UNFRACTIONATED)
Heparin Unfractionated: 0.1 IU/mL — ABNORMAL LOW (ref 0.30–0.70)
Heparin Unfractionated: 0.33 IU/mL (ref 0.30–0.70)

## 2013-06-30 LAB — PROTIME-INR
INR: 1.09 (ref 0.00–1.49)
Prothrombin Time: 13.9 seconds (ref 11.6–15.2)

## 2013-06-30 LAB — HEMOGLOBIN A1C
Hgb A1c MFr Bld: 6.9 % — ABNORMAL HIGH (ref <5.7)
Mean Plasma Glucose: 151 mg/dL — ABNORMAL HIGH (ref <117)

## 2013-06-30 LAB — ABO/RH: ABO/RH(D): A POS

## 2013-06-30 LAB — PREPARE RBC (CROSSMATCH)

## 2013-06-30 LAB — SURGICAL PCR SCREEN
MRSA, PCR: NEGATIVE
Staphylococcus aureus: NEGATIVE

## 2013-06-30 LAB — APTT: aPTT: 58 seconds — ABNORMAL HIGH (ref 24–37)

## 2013-06-30 LAB — MRSA PCR SCREENING: MRSA by PCR: NEGATIVE

## 2013-06-30 MED ORDER — ALPRAZOLAM 0.25 MG PO TABS: 0.2500 mg | ORAL_TABLET | ORAL | Status: DC | PRN

## 2013-06-30 MED ORDER — PLASMA-LYTE 148 IV SOLN
INTRAVENOUS | Status: DC
  Filled 2013-06-30: qty 2.5

## 2013-06-30 MED ORDER — AMINOCAPROIC ACID 250 MG/ML IV SOLN
INTRAVENOUS | Status: DC
  Filled 2013-06-30: qty 40

## 2013-06-30 MED ORDER — NITROGLYCERIN IN D5W 200-5 MCG/ML-% IV SOLN: 2.0000 ug/min | INTRAVENOUS | Status: DC

## 2013-06-30 MED ORDER — DOPAMINE-DEXTROSE 3.2-5 MG/ML-% IV SOLN: 2.0000 ug/kg/min | INTRAVENOUS | Status: DC

## 2013-06-30 MED ORDER — DEXTROSE 5 % IV SOLN
750.0000 mg | INTRAVENOUS | Status: DC
  Filled 2013-06-30: qty 750

## 2013-06-30 MED ORDER — BISACODYL 5 MG PO TBEC
5.0000 mg | DELAYED_RELEASE_TABLET | Freq: Once | ORAL | Status: AC
  Administered 2013-07-01: 5 mg via ORAL
  Filled 2013-06-30: qty 1

## 2013-06-30 MED ORDER — CHLORHEXIDINE GLUCONATE 4 % EX LIQD
60.0000 mL | Freq: Once | CUTANEOUS | Status: AC
  Administered 2013-07-01: 4 via TOPICAL
  Filled 2013-06-30: qty 15

## 2013-06-30 MED ORDER — CHLORHEXIDINE GLUCONATE 4 % EX LIQD
60.0000 mL | Freq: Once | CUTANEOUS | Status: AC
  Administered 2013-07-02: 4 via TOPICAL
  Filled 2013-06-30: qty 15

## 2013-06-30 MED ORDER — VANCOMYCIN HCL 10 G IV SOLR
1250.0000 mg | INTRAVENOUS | Status: DC
  Filled 2013-06-30: qty 1250

## 2013-06-30 MED ORDER — INSULIN DETEMIR 100 UNIT/ML ~~LOC~~ SOLN
12.0000 [IU] | Freq: Every day | SUBCUTANEOUS | Status: DC
  Administered 2013-06-30 – 2013-07-01 (×2): 12 [IU] via SUBCUTANEOUS
  Filled 2013-06-30 (×3): qty 0.12

## 2013-06-30 MED ORDER — DIAZEPAM 2 MG PO TABS
2.0000 mg | ORAL_TABLET | Freq: Once | ORAL | Status: AC
  Administered 2013-07-02: 2 mg via ORAL
  Filled 2013-06-30: qty 1

## 2013-06-30 MED ORDER — METOPROLOL TARTRATE 12.5 MG HALF TABLET
12.5000 mg | ORAL_TABLET | Freq: Once | ORAL | Status: AC
  Administered 2013-07-02: 12.5 mg via ORAL
  Filled 2013-06-30: qty 1

## 2013-06-30 MED ORDER — CHLORHEXIDINE GLUCONATE 4 % EX LIQD: 60.0000 mL | Freq: Once | CUTANEOUS | Status: DC

## 2013-06-30 MED ORDER — MAGNESIUM HYDROXIDE 400 MG/5ML PO SUSP
30.0000 mL | Freq: Once | ORAL | Status: AC
  Administered 2013-06-30: 30 mL via ORAL
  Filled 2013-06-30: qty 30

## 2013-06-30 MED ORDER — GABAPENTIN 300 MG PO CAPS
300.0000 mg | ORAL_CAPSULE | Freq: Three times a day (TID) | ORAL | Status: DC
  Administered 2013-06-30 – 2013-07-14 (×35): 300 mg via ORAL
  Filled 2013-06-30 (×44): qty 1

## 2013-06-30 MED ORDER — POTASSIUM CHLORIDE 2 MEQ/ML IV SOLN
80.0000 meq | INTRAVENOUS | Status: DC
  Filled 2013-06-30: qty 40

## 2013-06-30 MED ORDER — TRAZODONE HCL 50 MG PO TABS
50.0000 mg | ORAL_TABLET | Freq: Every day | ORAL | Status: DC
  Administered 2013-06-30 – 2013-07-08 (×7): 50 mg via ORAL
  Filled 2013-06-30 (×10): qty 1

## 2013-06-30 MED ORDER — PHENYLEPHRINE HCL 10 MG/ML IJ SOLN
30.0000 ug/min | INTRAVENOUS | Status: DC
  Filled 2013-06-30: qty 2

## 2013-06-30 MED ORDER — BISACODYL 5 MG PO TBEC: 5.0000 mg | DELAYED_RELEASE_TABLET | Freq: Once | ORAL | Status: DC

## 2013-06-30 MED ORDER — MAGNESIUM SULFATE 50 % IJ SOLN
40.0000 meq | INTRAMUSCULAR | Status: DC
  Filled 2013-06-30: qty 10

## 2013-06-30 MED ORDER — ALBUTEROL SULFATE (2.5 MG/3ML) 0.083% IN NEBU
2.5000 mg | INHALATION_SOLUTION | Freq: Once | RESPIRATORY_TRACT | Status: AC
  Administered 2013-06-30: 2.5 mg via RESPIRATORY_TRACT

## 2013-06-30 MED ORDER — TEMAZEPAM 15 MG PO CAPS: 15.0000 mg | ORAL_CAPSULE | Freq: Once | ORAL | Status: DC | PRN

## 2013-06-30 MED ORDER — DEXMEDETOMIDINE HCL IN NACL 400 MCG/100ML IV SOLN: 0.1000 ug/kg/h | INTRAVENOUS | Status: DC

## 2013-06-30 MED ORDER — DEXTROSE 5 % IV SOLN
1.5000 g | INTRAVENOUS | Status: DC
  Filled 2013-06-30: qty 1.5

## 2013-06-30 MED ORDER — LEVOTHYROXINE SODIUM 50 MCG PO TABS
50.0000 ug | ORAL_TABLET | Freq: Every day | ORAL | Status: DC
  Administered 2013-07-01 – 2013-07-14 (×12): 50 ug via ORAL
  Filled 2013-06-30 (×16): qty 1

## 2013-06-30 MED ORDER — TEMAZEPAM 15 MG PO CAPS: 15.0000 mg | ORAL_CAPSULE | Freq: Once | ORAL | Status: AC | PRN

## 2013-06-30 MED ORDER — METOPROLOL TARTRATE 12.5 MG HALF TABLET
12.5000 mg | ORAL_TABLET | Freq: Once | ORAL | Status: DC
  Filled 2013-06-30: qty 1

## 2013-06-30 MED ORDER — INSULIN ASPART 100 UNIT/ML ~~LOC~~ SOLN
0.0000 [IU] | SUBCUTANEOUS | Status: DC
  Administered 2013-06-30: 2 [IU] via SUBCUTANEOUS
  Administered 2013-06-30: 5 [IU] via SUBCUTANEOUS
  Administered 2013-06-30 – 2013-07-01 (×4): 3 [IU] via SUBCUTANEOUS
  Administered 2013-07-01: 2 [IU] via SUBCUTANEOUS

## 2013-06-30 MED ORDER — SODIUM CHLORIDE 0.9 % IV SOLN
INTRAVENOUS | Status: DC
  Filled 2013-06-30: qty 30

## 2013-06-30 MED ORDER — SODIUM CHLORIDE 0.9 % IV SOLN
INTRAVENOUS | Status: DC
  Filled 2013-06-30: qty 1

## 2013-06-30 MED ORDER — EPINEPHRINE HCL 1 MG/ML IJ SOLN
0.5000 ug/min | INTRAVENOUS | Status: DC
  Filled 2013-06-30: qty 4

## 2013-06-30 MED ORDER — DIAZEPAM 2 MG PO TABS: 2.0000 mg | ORAL_TABLET | Freq: Once | ORAL | Status: DC

## 2013-06-30 MED FILL — Ondansetron HCl Inj 4 MG/2ML (2 MG/ML): INTRAMUSCULAR | Qty: 2 | Status: AC

## 2013-06-30 NOTE — Progress Notes (Signed)
[id number] Discussed importance of walking and using IS. Discussed sternal precautions with pt and family. Gave OHS booklet and care guide. Wrote instructions for viewing pre op video and encouraged to watch before surgery. Will follow up after surgery. Graylon Good RN BSN 06/30/2013 9:16 AM

## 2013-06-30 NOTE — Progress Notes (Signed)
ANTICOAGULATION CONSULT NOTE - Follow Up Consult  Pharmacy Consult for heparin Indication: 3V CAD  Labs:  Recent Labs  06/29/13 2250 06/30/13 0258  HEPARINUNFRC  --  0.10*  CREATININE  --  1.10  TROPONINI 3.36*  --     Assessment: 57yo female subtherapeutic on heparin with initial dosing post-cath.  Goal of Therapy:  Heparin level 0.3-0.7 units/ml   Plan:  Will increase heparin gtt by 3 units/kg/hr to 1200 units/hr and check level in 6hr.  Wynona Neat, PharmD, BCPS  06/30/2013,4:16 AM

## 2013-06-30 NOTE — Progress Notes (Addendum)
VASCULAR LAB PRELIMINARY  PRELIMINARY  PRELIMINARY  PRELIMINARY  Pre-op Cardiac Surgery  Carotid Findings:  Bilateral:  1-39% ICA stenosis.  Vertebral artery flow is antegrade.      Upper Extremity Right Left  Brachial Pressures 151 Triphasic 155 Triphasic  Radial Waveforms Triphasic Triphasic  Ulnar Waveforms Triphasic Triphasic  Palmar Arch (Allen's Test) Normal Normal   Findings:  Doppler waveforms remained normal bilaterally with both radial and ulnar compressions    Lower  Extremity Right Left  Dorsalis Pedis 175 Triphasic 178 Biphasic  Posterior Tibial 186 Tiphasic 197 Biphasic  Ankle/Brachial Indices 1.20 1.27    Findings:  ABIs indicates normal arterial flow bilaterally at rest. Doppler waveforms are within normal limits bilaterally.   Nani Ravens, RVT 06/30/2013, 12:29 PM   [facility], [place] 06/30/2013 2:05 PM

## 2013-06-30 NOTE — Progress Notes (Signed)
ANTICOAGULATION CONSULT NOTE - Initial Consult  Pharmacy Consult for Heparin Indication: severe 3VCAD  No Known Allergies  Patient Measurements: Height: 5\' 4"  (162.6 cm) Weight: 161 lb 9.6 oz (73.3 kg) IBW/kg (Calculated) : 54.7 Heparin Dosing Weight: 70 kg  Vital Signs: Temp: 98.4 F (36.9 C) (04/28 1203) Temp src: Oral (04/28 1203) BP: 120/60 mmHg (04/28 1203)  Labs:  Recent Labs  06/29/13 2250 06/30/13 0258 06/30/13 1049  HGB  --  10.6*  --   HCT  --  31.6*  --   PLT  --  197  --   APTT  --   --  58*  LABPROT  --   --  13.9  INR  --   --  1.09  HEPARINUNFRC  --  0.10* 0.33  CREATININE  --  1.10 0.97  TROPONINI 3.36*  --   --     Estimated Creatinine Clearance: 61.2 ml/min (by C-G formula based on Cr of 0.97).   Medical History: Past Medical History  Diagnosis Date  . Diabetes mellitus without complication   . Hypertension   . Hyperlipidemia   . Hypothyroidism   . Diabetic neuropathy      Assessment: 58 y/o F transferred from [facility] admitted with CHF/pulmonary edema, CP and significantly elevated troponins. Currently s/p cath with EF 30% and 3VCAD.  Order to start IV heparin which is now at low end goal. Surgery postponed until pulmonary edema and glucose under better control. No bleeding complications noted.  PMH: HTN, HLD, DM  Goal of Therapy:  Heparin level 0.3-0.7 units/ml Monitor platelets by anticoagulation protocol: Yes   Plan:  Increase heparin to 1300/hr Check heparin level in am Heparin level and CBC daily.  Erin Hearing PharmD., BCPS Clinical Pharmacist Pager [phone] 06/30/2013 1:39 PM

## 2013-06-30 NOTE — Care Management Note (Addendum)
    Page 1 of 2   07/14/2013     9:36:12 AM CARE MANAGEMENT NOTE 07/14/2013  Patient:  Jamie Arias, Jamie Arias   Account Number:  [id number]  Date Initiated:  06/30/2013  Documentation initiated by:  Elissa Hefty  Subjective/Objective Assessment:   adm w mi     Action/Plan:   lives w husband, pcp dr Roxanne Mins rey   Anticipated DC Date:  07/14/2013   Anticipated DC Plan:  [place]  CM consult      [place]   Choice offered to / List presented to:  C-1 Patient   DME arranged  Vassie Moselle      DME agency  [place] arranged  HH-1 RN      [place].   Status of service:   Medicare Important Message given?   (If response is "NO", the following Medicare IM given date fields will be blank) Date Medicare IM given:   Date Additional Medicare IM given:    Discharge Disposition:  [place]  Per UR Regulation:  Reviewed for med. necessity/level of care/duration of stay  If discussed at [place] of Stay Meetings, dates discussed:   07/07/2013  07/09/2013    Comments:  5/12 0933 debbie Corben Auzenne rn,bsn pt for dc home today w fam. pt's pcp is dr Roxanne Mins rey. she gets meds from [place].  rolling walker ordered w [facility] and have spoken w jermaine to del to room today. went over hhc agencies in [place] no pref. ref to [facility] since they cover [place] [facility] and assist pt's w no ins.  07/07/13 [place] RN MSN BSN CCM Spouse very supportive, pt ambulating well.  S/P thoracentesis for pleural effusion.  07/03/13 1500 Henrietta Mayo RN MSN BSN CCM S/P CABG, neo/dopamine/milrinong gtts.

## 2013-06-30 NOTE — Progress Notes (Signed)
CRITICAL VALUE ALERT  Critical value received: Troponin = 3.36  Date of notification:  06/30/13  Time of notification: 0005  Critical value read back:Yes  Nurse who received alert: O.Ellisyn Icenhower  MD notified (1st page):  MD not paged, MD aware of previous values and trending down  Time of first page:  MD notified (2nd page):  Time of second page:  Responding MD:   Time MD responded:

## 2013-06-30 NOTE — Consult Note (Signed)
[address]       [place] [address]             [phone]        Jamie Arias [facility] Medical Record [id number] Date of Birth: [birth year]  Referring:  Dr. Fletcher Anon Primary Care: Rey,Raquel, NP  Chief Complaint:  NSTEMI, CAD  History of Present Illness:    [name] is a 58 yo overweight female with known medical history of Hypertension, Hyperlipidemia, and Type 2 Diabetes.  The patient presented to the Emergency Department at [facility] yesterday with complaints of severe shortness of breath, orthopnea, and cough when she tries to lay down.  She states she actually had some chest pain located sub-sternally on Friday which last for about 1 hour.  She felt better on Saturday, but noticed she was short of breath.  She again admits to an episode of chest tightness Saturday night.  Then on the morning of presentation the patient became severely short of breath prompting presentation to the ED.  Workup in the ED found patient to be hypoxic with Sats of 68%.  A BNP was elevated at 9000 and initial Troponin level was at 2.  She was treated with IV lasix which provided relief in patient's symptoms.  She was also felt to have pneumonia and started on Rocephin and Zithromax.  Cardiology consult was obtained and the patient was evaluated by Dr. Fletcher Anon.  He felt the patient most likely suffered an MI on Friday.  He performed and Echocardiogram which showed a reduced EF of 35-40 % with severe hypokinesis of the inferior, inferoseptal, and inferolateral hypokineses.  It was felt that she should have urgent cardiac catheterization which showed severe 3 Vessel CAD involving RCA, Left Circumflex, and LAD.  It was felt she would benefit from urgent CABG and was transferred to [facility] for further evaluation.  Upon arrival patient was admitted by Cardiology and placed on a Heparin drip.  She has remained chest pain free since admission and her shortness of breath is  significantly improved  She states she did not eat breakfast, because she was told she may be having open heart surgery today.  Current Activity/ Functional Status: Patient is independent with mobility/ambulation, transfers, ADL's, IADL's.   Zubrod Score: At the time of surgery this patient's most appropriate activity status/level should be described as: [x]     0    Normal activity, no symptoms []     1    Restricted in physical strenuous activity but ambulatory, able to do out light work []     2    Ambulatory and capable of self care, unable to do work activities, up and about                 more than 50%  Of the time                            []     3    Only limited self care, in bed greater than 50% of waking hours []     4    Completely disabled, no self care, confined to bed or chair []     5    Moribund  Past Medical History  Diagnosis Date  . Diabetes mellitus without complication   . Hypertension   . Hyperlipidemia   . Hypothyroidism   . Diabetic neuropathy     Past Surgical History  Procedure Laterality Date  . Eye surgery      Cataract both eyes    History  Smoking status  . Never Smoker   Smokeless tobacco  . Never Used    History  Alcohol Use No    History   Social History  . Marital Status: Married    Spouse Name: N/A    Number of Children: N/A  . Years of Education: N/A   Occupational History  . Not on file.   Social History Main Topics  . Smoking status: Never Smoker   . Smokeless tobacco: Never Used  . Alcohol Use: No  . Drug Use: No  . Sexual Activity: Not on file   Other Topics Concern  . Not on file   Social History Narrative   Patient lives at home with her husband. Patient has 4 adult children.    No Known Allergies  Current Facility-Administered Medications  Medication Dose Route Frequency Provider Last Rate Last Dose  . 0.9 %  sodium chloride infusion  250 mL Intravenous PRN Larey Dresser, MD 10 mL/hr at 06/29/13 2204 10  mL/hr at 06/29/13 2204  . acetaminophen (TYLENOL) tablet 650 mg  650 mg Oral Q4H PRN Larey Dresser, MD      . aspirin EC tablet 81 mg  81 mg Oral Daily Larey Dresser, MD      . atorvastatin (LIPITOR) tablet 80 mg  80 mg Oral q1800 Larey Dresser, MD      . carvedilol (COREG) tablet 6.25 mg  6.25 mg Oral BID WC Larey Dresser, MD      . furosemide (LASIX) injection 40 mg  40 mg Intravenous BID Larey Dresser, MD   40 mg at 06/30/13 0849  . heparin ADULT infusion 100 units/mL (25000 units/250 mL)  1,200 Units/hr Intravenous Continuous Rogue Bussing, RPH 12 mL/hr at 06/30/13 0800 1,200 Units/hr at 06/30/13 0800  . insulin aspart (novoLOG) injection 0-15 Units  0-15 Units Subcutaneous 6 times per day Ivin [name], MD   3 Units at 06/30/13 [phone]  . insulin aspart (novoLOG) injection 0-5 Units  0-5 Units Subcutaneous QHS Larey Dresser, MD   2 Units at 06/29/13 2229  . lisinopril (PRINIVIL,ZESTRIL) tablet 20 mg  20 mg Oral Daily Larey Dresser, MD      . nitroGLYCERIN (NITROSTAT) SL tablet 0.4 mg  0.4 mg Sublingual Q5 Min x 3 PRN Larey Dresser, MD      . ondansetron [facility]) injection 4 mg  4 mg Intravenous Q6H PRN Larey Dresser, MD   4 mg at 06/30/13 0848  . sodium chloride 0.9 % injection 3 mL  3 mL Intravenous Q12H Larey Dresser, MD   3 mL at 06/29/13 2224  . sodium chloride 0.9 % injection 3 mL  3 mL Intravenous PRN Larey Dresser, MD        Prescriptions prior to admission  Medication Sig Dispense Refill  . aspirin EC 81 MG tablet Take 81 mg by mouth daily.      Marland Kitchen gabapentin (NEURONTIN) 300 MG capsule Take 1 capsule (300 mg total) by mouth 3 (three) times daily.  90 capsule  3  . hydrochlorothiazide (HYDRODIURIL) 25 MG tablet Take 25 mg by mouth daily.      . insulin detemir (LEVEMIR) 100 UNIT/ML injection Inject 12 Units into the skin at bedtime.      Marland Kitchen levothyroxine (SYNTHROID, LEVOTHROID) 50 MCG tablet Take 50 mcg by mouth  daily.      . lisinopril (PRINIVIL,ZESTRIL)  40 MG tablet Take 40 mg by mouth daily.      . metFORMIN (GLUCOPHAGE) 500 MG tablet Take 500 mg by mouth 2 (two) times daily with a meal.      . metoprolol succinate (TOPROL XL) 25 MG 24 hr tablet Take 1 tablet (25 mg total) by mouth daily.  30 tablet  5  . simvastatin (ZOCOR) 10 MG tablet Take 1 tablet (10 mg total) by mouth at bedtime.  30 tablet  3  . traZODone (DESYREL) 100 MG tablet Take 1 tablet (100 mg total) by mouth at bedtime.  30 tablet  6  . glucose blood test strip Please dispense Contour next EZ test strips. She is testing 4 times a day. Diagnosis code 250.0  120 each  12  . Insulin Syringe-Needle U-100 (INSULIN SYRINGE .5CC/30GX1/2") 30G X 1/2" 0.5 ML MISC Use as directed  100 each  5  . Lancets (FREESTYLE) lancets USE TO TEST BLOOD SUGAR FOUR TIMES DAILY  100 each  5    Family History  Problem Relation Age of Onset  . COPD Mother   . Cancer Mother     Lung  . Pulmonary embolism Father      Review of Systems:     Cardiac Review of Systems: Y or N  Chest Pain [ x   ]  Resting SOB [  x ] Exertional SOB  [  ]  Orthopnea [x  ]   Pedal Edema [   ]    Palpitations [  ] Syncope  [  ]   Presyncope [   ]  General Review of Systems: [Y] = yes [  ]=no Constitional: recent weight change [  ]; anorexia [  ]; fatigue [  ]; nausea [  ]; night sweats [  ]; fever [  ]; or chills [  ]                                                               Dental: poor dentition[  ]; Last Dentist visit:   Eye : blurred vision [  ]; diplopia [   ]; vision changes [  ];  Amaurosis fugax[  ]; Resp: cough [x  ];  wheezing[  ];  hemoptysis[  ]; shortness of breath[x  ]; paroxysmal nocturnal dyspnea[  ]; dyspnea on exertion[  ]; or orthopnea[x  ];  GI:  gallstones[  ], vomiting[  ];  dysphagia[  ]; melena[  ];  hematochezia [  ]; heartburn[  ];   Hx of  Colonoscopy[  ]; GU: kidney stones [  ]; hematuria[  ];   dysuria [  ];  nocturia[  ];  history of     obstruction [  ]; urinary frequency [  ]              Skin: rash, swelling[  ];, hair loss[  ];  peripheral edema[  ];  or itching[  ]; Musculosketetal: myalgias[  ];  joint swelling[  ];  joint erythema[  ];  joint pain[  ];  back pain[  ];  Heme/Lymph: bruising[  ];  bleeding[  ];  anemia[  ];  Neuro: TIA[  ];  headaches[  ];  stroke[  ];  vertigo[  ];  seizures[  ];   paresthesias[  ];  difficulty walking[  ];  Psych:depression[  ]; anxiety[  ];  Endocrine: diabetes[x  ];  thyroid dysfunction[  ];  Immunizations: Flu [  ]; Pneumococcal[  ];  Other:  Physical Exam: BP 138/62  Temp(Src) 97.9 F (36.6 C) (Oral)  Resp 23  Ht 5\' 4"  (1.626 m)  Wt 161 lb 9.6 oz (73.3 kg)  BMI 27.72 kg/m2  SpO2 96%  General appearance: alert, cooperative and no distress Heart: regular rate and rhythm Lungs: clear to auscultation bilaterally Abdomen: soft, non-tender; bowel sounds normal; no masses,  no organomegaly Extremities: extremities normal, atraumatic, no cyanosis or edema Neuro: intact   Diagnostic Studies & Laboratory data:     Recent Radiology Findings:   Dg Chest Port 1 View  06/30/2013   CLINICAL DATA:  Congestive heart failure.  EXAM: PORTABLE CHEST - 1 VIEW  COMPARISON:  06/29/2013  FINDINGS: Pulmonary edema has improved. Moderate bilateral pleural effusions persist with minimal residual edema at the right base. Heart size and pulmonary vascularity are now normal. No acute osseous abnormality.  IMPRESSION: Improving congestive heart failure.   Electronically Signed   By: Rozetta Nunnery M.D.   On: 06/30/2013 08:59      Recent Lab Findings: Lab Results  Component Value Date   WBC 9.3 06/30/2013   HGB 10.6* 06/30/2013   HCT 31.6* 06/30/2013   PLT 197 06/30/2013   GLUCOSE 255* 06/30/2013   CHOL 220* 06/30/2013   TRIG 87 06/30/2013   HDL 80 06/30/2013   LDLDIRECT 126.9 10/31/2012   LDLCALC 123* 06/30/2013   ALT 17 10/31/2012   AST 16 10/31/2012   NA 137 06/30/2013   K 4.1 06/30/2013   CL 98 06/30/2013   CREATININE 1.10 06/30/2013   BUN 32*  06/30/2013   CO2 25 06/30/2013   TSH 4.380 06/29/2013   HGBA1C 7.2* 06/12/2013    Assessment / Plan:      1. Severe 3 Vessel CAD- will need CABG, please keep NPO, Dr. Prescott Gum will review films and surgery may occur this afternoon 2. DM- Preop A1c is 7.2, moderately good control 3. HTN- on Coreg, Lisionpril 4. Hyperlipidemia-on Lipitor   PATIENT EXAMINED, CORONARY ARTERIOGRAMS AND 2d ECHO REVIEWED.  I agree with the above note by E Barett PA-C   58 yo diabetic patient had non STEMI 4 days ago and presented in CHF from severe LV dysfunction yesterday at [facility] Cath shows L main ok but  severe 3 vessel Dz with adequate LAD , OM targets and small distal RCA target. Echo shows mod MR.  She still has sig pul edema and will be diuresed and blood sugars better controlled- CABG with possible Mitral repair later this admit

## 2013-06-30 NOTE — Progress Notes (Signed)
    Subjective:  Feels better this am. Breathing improved. No chest pain.  Objective:  Vital Signs in the last 24 hours: Temp:  [97.8 F (36.6 C)-98.6 F (37 C)] 97.9 F (36.6 C) (04/28 0800) Resp:  [13-29] 23 (04/28 0800) BP: (99-159)/(44-88) 138/62 mmHg (04/28 0800) SpO2:  [87 %-98 %] 96 % (04/28 0800) Weight:  [161 lb 9.6 oz (73.3 kg)-165 lb 5.5 oz (75 kg)] 161 lb 9.6 oz (73.3 kg) (04/28 0419)  Intake/Output from previous day: 04/27 0701 - 04/28 0700 In: 320.8 [P.O.:140; I.V.:180.8] Out: 1550 [Urine:1550]  Physical Exam: Pt is alert and oriented, NAD HEENT: normal Neck: JVP - normal, carotids 2+= without bruits Lungs: CTA bilaterally CV: RRR without murmur or gallop Abd: soft, NT, Positive BS Ext: no C/C/E, distal pulses intact and equal Skin: warm/dry no rash   Lab Results:  Recent Labs  06/30/13 0258  WBC 9.3  HGB 10.6*  PLT 197    Recent Labs  06/30/13 0258  NA 137  K 4.1  CL 98  CO2 25  GLUCOSE 255*  BUN 32*  CREATININE 1.10    Recent Labs  06/29/13 2250  TROPONINI 3.36*    Cardiac Studies: CXR: FINDINGS:  Pulmonary edema has improved. Moderate bilateral pleural effusions  persist with minimal residual edema at the right base. Heart size  and pulmonary vascularity are now normal. No acute osseous  abnormality.  IMPRESSION:  Improving congestive heart failure.  Tele: Sinus rhythm  Assessment/Plan:  1. NSTEMI - severe multivessel CAD. Plans for urgent CABG. Pt CP-free on IV heparin. Dr Darcey Nora to see today. Keep NPO in case she goes for CABG today.  2. Acute systolic heart failure. Clinically improved with IV diuresis, carvedilol, and lisinopril.  3. Type 2 DM - on sliding scale insulin now while she is NPO.   Dispo: continue current Rx. Possible OR next 24 hours.   Sherren Mocha, M.D. 06/30/2013, 10:16 AM

## 2013-07-01 ENCOUNTER — Telehealth: Payer: Self-pay | Admitting: Adult Health

## 2013-07-01 ENCOUNTER — Inpatient Hospital Stay (HOSPITAL_COMMUNITY): Payer: Self-pay

## 2013-07-01 LAB — CBC
HCT: 30.8 % — ABNORMAL LOW (ref 36.0–46.0)
Hemoglobin: 10.4 g/dL — ABNORMAL LOW (ref 12.0–15.0)
MCH: 29.6 pg (ref 26.0–34.0)
MCHC: 33.8 g/dL (ref 30.0–36.0)
MCV: 87.7 fL (ref 78.0–100.0)
Platelets: 151 10*3/uL (ref 150–400)
RBC: 3.51 MIL/uL — ABNORMAL LOW (ref 3.87–5.11)
RDW: 14 % (ref 11.5–15.5)
WBC: 7 10*3/uL (ref 4.0–10.5)

## 2013-07-01 LAB — COMPREHENSIVE METABOLIC PANEL
ALT: 14 U/L (ref 0–35)
AST: 25 U/L (ref 0–37)
Albumin: 2.6 g/dL — ABNORMAL LOW (ref 3.5–5.2)
Alkaline Phosphatase: 48 U/L (ref 39–117)
BUN: 29 mg/dL — ABNORMAL HIGH (ref 6–23)
CO2: 24 mEq/L (ref 19–32)
Calcium: 8.8 mg/dL (ref 8.4–10.5)
Chloride: 100 mEq/L (ref 96–112)
Creatinine, Ser: 1.09 mg/dL (ref 0.50–1.10)
GFR calc Af Amer: 63 mL/min — ABNORMAL LOW (ref >90)
GFR calc non Af Amer: 54 mL/min — ABNORMAL LOW (ref >90)
Glucose, Bld: 118 mg/dL — ABNORMAL HIGH (ref 70–99)
Potassium: 3.8 mEq/L (ref 3.7–5.3)
Sodium: 138 mEq/L (ref 137–147)
Total Bilirubin: 0.3 mg/dL (ref 0.3–1.2)
Total Protein: 5.7 g/dL — ABNORMAL LOW (ref 6.0–8.3)

## 2013-07-01 LAB — GLUCOSE, CAPILLARY
Glucose-Capillary: 102 mg/dL — ABNORMAL HIGH (ref 70–99)
Glucose-Capillary: 175 mg/dL — ABNORMAL HIGH (ref 70–99)
Glucose-Capillary: 165 mg/dL — ABNORMAL HIGH (ref 70–99)
Glucose-Capillary: 177 mg/dL — ABNORMAL HIGH (ref 70–99)

## 2013-07-01 LAB — HEPARIN LEVEL (UNFRACTIONATED): Heparin Unfractionated: 0.49 IU/mL (ref 0.30–0.70)

## 2013-07-01 MED ORDER — MAGNESIUM SULFATE 50 % IJ SOLN
40.0000 meq | INTRAMUSCULAR | Status: DC
  Filled 2013-07-01: qty 10

## 2013-07-01 MED ORDER — EPINEPHRINE HCL 1 MG/ML IJ SOLN
0.5000 ug/min | INTRAVENOUS | Status: DC
  Filled 2013-07-01: qty 4

## 2013-07-01 MED ORDER — PHENYLEPHRINE HCL 10 MG/ML IJ SOLN
30.0000 ug/min | INTRAMUSCULAR | Status: AC
  Administered 2013-07-02: 25 ug/min via INTRAVENOUS
  Filled 2013-07-01: qty 2

## 2013-07-01 MED ORDER — DEXTROSE 5 % IV SOLN
1.5000 g | INTRAVENOUS | Status: AC
  Administered 2013-07-02: .75 g via INTRAVENOUS
  Administered 2013-07-02: 1.5 g via INTRAVENOUS
  Filled 2013-07-01 (×2): qty 1.5

## 2013-07-01 MED ORDER — SODIUM CHLORIDE 0.9 % IV SOLN
INTRAVENOUS | Status: DC
  Filled 2013-07-01: qty 30

## 2013-07-01 MED ORDER — PLASMA-LYTE 148 IV SOLN
INTRAVENOUS | Status: AC
  Administered 2013-07-02: 08:00:00
  Filled 2013-07-01: qty 2.5

## 2013-07-01 MED ORDER — DEXMEDETOMIDINE HCL IN NACL 400 MCG/100ML IV SOLN
0.1000 ug/kg/h | INTRAVENOUS | Status: AC
  Administered 2013-07-02: 0.2 ug/kg/h via INTRAVENOUS
  Filled 2013-07-01: qty 100

## 2013-07-01 MED ORDER — FUROSEMIDE 40 MG PO TABS
40.0000 mg | ORAL_TABLET | Freq: Every day | ORAL | Status: DC
  Filled 2013-07-01: qty 1

## 2013-07-01 MED ORDER — SODIUM CHLORIDE 0.9 % IV SOLN
INTRAVENOUS | Status: AC
  Administered 2013-07-02: 70 mL via INTRAVENOUS
  Filled 2013-07-01: qty 40

## 2013-07-01 MED ORDER — POTASSIUM CHLORIDE 2 MEQ/ML IV SOLN
80.0000 meq | INTRAVENOUS | Status: DC
  Filled 2013-07-01: qty 40

## 2013-07-01 MED ORDER — VANCOMYCIN HCL 10 G IV SOLR
1250.0000 mg | INTRAVENOUS | Status: AC
  Administered 2013-07-02: 1250 mg via INTRAVENOUS
  Filled 2013-07-01: qty 1250

## 2013-07-01 MED ORDER — NITROGLYCERIN IN D5W 200-5 MCG/ML-% IV SOLN
2.0000 ug/min | INTRAVENOUS | Status: AC
  Administered 2013-07-02: 10 ug/min via INTRAVENOUS
  Filled 2013-07-01: qty 250

## 2013-07-01 MED ORDER — DOPAMINE-DEXTROSE 3.2-5 MG/ML-% IV SOLN
2.0000 ug/kg/min | INTRAVENOUS | Status: AC
  Administered 2013-07-02: 2.5 ug/kg/min via INTRAVENOUS
  Filled 2013-07-01: qty 250

## 2013-07-01 MED ORDER — DEXTROSE 5 % IV SOLN
750.0000 mg | INTRAVENOUS | Status: DC
  Filled 2013-07-01 (×2): qty 750

## 2013-07-01 MED ORDER — SODIUM CHLORIDE 0.9 % IV SOLN
INTRAVENOUS | Status: AC
  Administered 2013-07-02: 1 [IU]/h via INTRAVENOUS
  Filled 2013-07-01: qty 1

## 2013-07-01 NOTE — Clinical Documentation Improvement (Signed)
  Clinical Indicators:   - Noted to be significantly hypoxic with 02 Sat 68% in ED at [facility] per H&P   - Required Bipap at [facility] to maintain adequate saturation   Possible Conditions:   - Acute Hypoxic Respiratory Failure, requiring Bipap at [facility], improved   - Other Condition   - Unable to Clinically Determine    Thank You,  Erling Conte ,RN BSN CCDS Certified Clinical Documentation Specialist:  [place] [facility]

## 2013-07-01 NOTE — Progress Notes (Signed)
    Subjective:  Feels ok. No further chest pain. Shortness of breath resolved.  Objective:  Vital Signs in the last 24 hours: Temp:  [98.1 F (36.7 C)-98.9 F (37.2 C)] 98.1 F (36.7 C) (04/29 0820) Pulse Rate:  [79-92] 79 (04/29 0820) Resp:  [13-22] 18 (04/29 0820) BP: (101-150)/(47-88) 150/88 mmHg (04/29 0820) SpO2:  [92 %-100 %] 100 % (04/29 0820) Weight:  [72.576 kg (160 lb)] 72.576 kg (160 lb) (04/29 0400)  Intake/Output from previous day: 04/28 0701 - 04/29 0700 In: 629 [P.O.:80; I.V.:549] Out: 3100 [Urine:3100]  Physical Exam: Pt is alert and oriented, NAD HEENT: normal Neck: JVP - normal, carotids 2+= without bruits Lungs: CTA bilaterally CV: RRR without murmur or gallop Abd: soft, NT, Positive BS, no hepatomegaly Ext: no C/C/E, distal pulses intact and equal Skin: warm/dry no rash   Lab Results:  Recent Labs  06/30/13 0258 07/01/13 0310  WBC 9.3 7.0  HGB 10.6* 10.4*  PLT 197 151    Recent Labs  06/30/13 1049 07/01/13 0310  NA 138 138  K 3.9 3.8  CL 100 100  CO2 22 24  GLUCOSE 154* 118*  BUN 28* 29*  CREATININE 0.97 1.09    Recent Labs  06/29/13 2250  TROPONINI 3.36*   Tele: Sinus rhythm, personally reviewed.  Assessment/Plan:  1. NSTEMI - severe multivessel CAD. Plans for CABG tomorrow. Pt CP-free on IV heparin, ASA, statin, beta-blocker.  2. Acute systolic heart failure. Clinically improved with IV diuresis, carvedilol, and lisinopril. Switch furosemide to oral. CXR shows resolution of pulmonary edema.  3. Type 2 DM - CBG's reviewed. Improved glycemic control now on Levemir and SSI.  4. Hyperlipidemia: LDL 123. On atorvastatin 80 mg.   Dispo: CABG tomorrow.  Sherren Mocha, M.D. 07/01/2013, 9:00 AM

## 2013-07-01 NOTE — Telephone Encounter (Signed)
Spouse called to let you know Jamie Arias had a heart attack this weekend.  [facility] is still in [facility] She will have surgery tomorrow

## 2013-07-01 NOTE — Telephone Encounter (Signed)
FYI

## 2013-07-01 NOTE — Telephone Encounter (Signed)
Please call Jamie Arias and let him know that my thoughts are with her. I was aware that she was in the hospital because her cardiologist has been sending me updates. Thank you for calling and letting us know.

## 2013-07-01 NOTE — Telephone Encounter (Signed)
Spoke with patient and notified her of Raquel's comments. Patient was thankful for the call and stated she will see you (Raquel) soon!

## 2013-07-01 NOTE — Progress Notes (Signed)
ANTICOAGULATION CONSULT NOTE   Pharmacy Consult for Heparin Indication: severe 3VCAD  No Known Allergies  Patient Measurements: Height: 5\' 4"  (162.6 cm) Weight: 160 lb (72.576 kg) IBW/kg (Calculated) : 54.7 Heparin Dosing Weight: 70 kg  Vital Signs: Temp: 98.1 F (36.7 C) (04/29 0820) Temp src: Oral (04/29 0820) BP: 150/88 mmHg (04/29 0820) Pulse Rate: 79 (04/29 0820)  Labs:  Recent Labs  06/29/13 2250 06/30/13 0258 06/30/13 1049 07/01/13 0310  HGB  --  10.6*  --  10.4*  HCT  --  31.6*  --  30.8*  PLT  --  197  --  151  APTT  --   --  58*  --   LABPROT  --   --  13.9  --   INR  --   --  1.09  --   HEPARINUNFRC  --  0.10* 0.33 0.49  CREATININE  --  1.10 0.97 1.09  TROPONINI 3.36*  --   --   --     Estimated Creatinine Clearance: 54.3 ml/min (by C-G formula based on Cr of 1.09).   Medical History: Past Medical History  Diagnosis Date  . Diabetes mellitus without complication   . Hypertension   . Hyperlipidemia   . Hypothyroidism   . Diabetic neuropathy      Assessment: 58 y/o F transferred from [facility] admitted with CHF/pulmonary edema, CP and significantly elevated troponins. Currently s/p cath with EF 30% and 3VCAD.  Started IV heparin 4/27 which continues to be at goal. Surgery planned for tomorrow 4/30. No bleeding complications noted.  DM - levemir restarted - cbg's controlled  PMH: HTN, HLD, DM  Goal of Therapy:  Heparin level 0.3-0.7 units/ml Monitor platelets by anticoagulation protocol: Yes   Plan:  Continue heparin at 1300/hr- follow up stop time Heparin level and CBC daily.  Erin Hearing PharmD., BCPS Clinical Pharmacist Pager [phone] 07/01/2013 9:22 AM

## 2013-07-01 NOTE — Progress Notes (Signed)
Procedure(s) (LRB): CORONARY ARTERY BYPASS GRAFTING (CABG) (N/A) MITRAL VALVE REPAIR OR REPLACEMENT (MVR) (N/A) INTRAOPERATIVE TRANSESOPHAGEAL ECHOCARDIOGRAM (N/A) Subjective: CAD, CHF post DMI Stable day today- CBG under better control Ready for CABG in am  Objective: Vital signs in last 24 hours: Temp:  [98.1 F (36.7 C)-98.8 F (37.1 C)] 98.6 F (37 C) (04/29 1227) Pulse Rate:  [75-92] 75 (04/29 1227) Cardiac Rhythm:  [-] Normal sinus rhythm (04/29 0740) Resp:  [13-22] 18 (04/29 0820) BP: (101-150)/(47-88) 124/72 mmHg (04/29 1227) SpO2:  [92 %-100 %] 97 % (04/29 1227) Weight:  [160 lb (72.576 kg)] 160 lb (72.576 kg) (04/29 0400)  Hemodynamic parameters for last 24 hours:   nsr Intake/Output from previous day: 04/28 0701 - 04/29 0700 In: 629 [P.O.:80; I.V.:549] Out: 3100 [Urine:3100] Intake/Output this shift: Total I/O In: 687 [P.O.:480; I.V.:207] Out: -   Lungs clear  Lab Results:  Recent Labs  06/30/13 0258 07/01/13 0310  WBC 9.3 7.0  HGB 10.6* 10.4*  HCT 31.6* 30.8*  PLT 197 151   BMET:  Recent Labs  06/30/13 1049 07/01/13 0310  NA 138 138  K 3.9 3.8  CL 100 100  CO2 22 24  GLUCOSE 154* 118*  BUN 28* 29*  CREATININE 0.97 1.09  CALCIUM 9.2 8.8    PT/INR:  Recent Labs  06/30/13 1049  LABPROT 13.9  INR 1.09   ABG    Component Value Date/Time   PHART 7.554* 06/30/2013 0815   HCO3 27.8* 06/30/2013 0815   TCO2 29 06/30/2013 0815   O2SAT 96.0 06/30/2013 0815   CBG (last 3)   Recent Labs  06/30/13 2106 07/01/13 0823 07/01/13 1230  GLUCAP 165* 102* 175*    Assessment/Plan: S/P Procedure(s) (LRB): CORONARY ARTERY BYPASS GRAFTING (CABG) (N/A) MITRAL VALVE REPAIR OR REPLACEMENT (MVR) (N/A) INTRAOPERATIVE TRANSESOPHAGEAL ECHOCARDIOGRAM (N/A) CABG in am - poss MV repair pending intraop TEE   LOS: 2 days    Tharon Aquas Trigt 07/01/2013

## 2013-07-02 ENCOUNTER — Encounter (HOSPITAL_COMMUNITY)
Admission: AD | Disposition: A | Discharge status: Home / Self Care | Payer: No Typology Code available for payment source | Source: Other Acute Inpatient Hospital | Attending: Cardiothoracic Surgery

## 2013-07-02 ENCOUNTER — Inpatient Hospital Stay (HOSPITAL_COMMUNITY): Payer: No Typology Code available for payment source | Admitting: Anesthesiology

## 2013-07-02 ENCOUNTER — Encounter (HOSPITAL_COMMUNITY): Payer: Self-pay | Admitting: Anesthesiology

## 2013-07-02 ENCOUNTER — Inpatient Hospital Stay (HOSPITAL_COMMUNITY): Payer: No Typology Code available for payment source

## 2013-07-02 DIAGNOSIS — I2510 Atherosclerotic heart disease of native coronary artery without angina pectoris: Secondary | ICD-10-CM

## 2013-07-02 HISTORY — PX: CORONARY ARTERY BYPASS GRAFT: SHX141

## 2013-07-02 HISTORY — PX: INTRAOPERATIVE TRANSESOPHAGEAL ECHOCARDIOGRAM: SHX5062

## 2013-07-02 LAB — POCT I-STAT, CHEM 8
BUN: 29 mg/dL — ABNORMAL HIGH (ref 6–23)
Calcium, Ion: 1.17 mmol/L (ref 1.12–1.23)
Chloride: 102 mEq/L (ref 96–112)
Creatinine, Ser: 1.2 mg/dL — ABNORMAL HIGH (ref 0.50–1.10)
Glucose, Bld: 112 mg/dL — ABNORMAL HIGH (ref 70–99)
HCT: 30 % — ABNORMAL LOW (ref 36.0–46.0)
Hemoglobin: 10.2 g/dL — ABNORMAL LOW (ref 12.0–15.0)
Potassium: 4.4 mEq/L (ref 3.7–5.3)
Sodium: 140 mEq/L (ref 137–147)
TCO2: 26 mmol/L (ref 0–100)

## 2013-07-02 LAB — POCT I-STAT 3, VENOUS BLOOD GAS (G3P V)
Bicarbonate: 25.4 mEq/L — ABNORMAL HIGH (ref 20.0–24.0)
O2 Saturation: 95 %
Patient temperature: 37.2
TCO2: 27 mmol/L (ref 0–100)
pCO2, Ven: 44.4 mmHg — ABNORMAL LOW (ref 45.0–50.0)
pH, Ven: 7.367 — ABNORMAL HIGH (ref 7.250–7.300)
pO2, Ven: 77 mmHg — ABNORMAL HIGH (ref 30.0–45.0)

## 2013-07-02 LAB — CBC
HCT: 30 % — ABNORMAL LOW (ref 36.0–46.0)
HCT: 31.3 % — ABNORMAL LOW (ref 36.0–46.0)
HCT: 31.5 % — ABNORMAL LOW (ref 36.0–46.0)
Hemoglobin: 10 g/dL — ABNORMAL LOW (ref 12.0–15.0)
Hemoglobin: 10.9 g/dL — ABNORMAL LOW (ref 12.0–15.0)
Hemoglobin: 10.9 g/dL — ABNORMAL LOW (ref 12.0–15.0)
MCH: 29.2 pg (ref 26.0–34.0)
MCH: 29.6 pg (ref 26.0–34.0)
MCH: 29.1 pg (ref 26.0–34.0)
MCHC: 33.3 g/dL (ref 30.0–36.0)
MCHC: 34.8 g/dL (ref 30.0–36.0)
MCHC: 34.6 g/dL (ref 30.0–36.0)
MCV: 87.5 fL (ref 78.0–100.0)
MCV: 85.1 fL (ref 78.0–100.0)
MCV: 84 fL (ref 78.0–100.0)
Platelets: 191 10*3/uL (ref 150–400)
Platelets: 138 10*3/uL — ABNORMAL LOW (ref 150–400)
Platelets: 151 10*3/uL (ref 150–400)
RBC: 3.43 MIL/uL — ABNORMAL LOW (ref 3.87–5.11)
RBC: 3.68 MIL/uL — ABNORMAL LOW (ref 3.87–5.11)
RBC: 3.75 MIL/uL — ABNORMAL LOW (ref 3.87–5.11)
RDW: 14.1 % (ref 11.5–15.5)
RDW: 14.3 % (ref 11.5–15.5)
RDW: 14.5 % (ref 11.5–15.5)
WBC: 7.1 10*3/uL (ref 4.0–10.5)
WBC: 10.9 10*3/uL — ABNORMAL HIGH (ref 4.0–10.5)
WBC: 11.7 10*3/uL — ABNORMAL HIGH (ref 4.0–10.5)

## 2013-07-02 LAB — POCT I-STAT 3, ART BLOOD GAS (G3+)
Acid-Base Excess: 3 mmol/L — ABNORMAL HIGH (ref 0.0–2.0)
Acid-Base Excess: 1 mmol/L (ref 0.0–2.0)
Bicarbonate: 27.5 mEq/L — ABNORMAL HIGH (ref 20.0–24.0)
Bicarbonate: 25.8 mEq/L — ABNORMAL HIGH (ref 20.0–24.0)
Bicarbonate: 24.1 mEq/L — ABNORMAL HIGH (ref 20.0–24.0)
Bicarbonate: 24.9 mEq/L — ABNORMAL HIGH (ref 20.0–24.0)
O2 Saturation: 100 %
O2 Saturation: 100 %
O2 Saturation: 92 %
O2 Saturation: 95 %
Patient temperature: 35.4
TCO2: 29 mmol/L (ref 0–100)
TCO2: 27 mmol/L (ref 0–100)
TCO2: 25 mmol/L (ref 0–100)
TCO2: 26 mmol/L (ref 0–100)
pCO2 arterial: 42.9 mmHg (ref 35.0–45.0)
pCO2 arterial: 41 mmHg (ref 35.0–45.0)
pCO2 arterial: 35.1 mmHg (ref 35.0–45.0)
pCO2 arterial: 42.9 mmHg (ref 35.0–45.0)
pH, Arterial: 7.415 (ref 7.350–7.450)
pH, Arterial: 7.407 (ref 7.350–7.450)
pH, Arterial: 7.437 (ref 7.350–7.450)
pH, Arterial: 7.371 (ref 7.350–7.450)
pO2, Arterial: 327 mmHg — ABNORMAL HIGH (ref 80.0–100.0)
pO2, Arterial: 172 mmHg — ABNORMAL HIGH (ref 80.0–100.0)
pO2, Arterial: 57 mmHg — ABNORMAL LOW (ref 80.0–100.0)
pO2, Arterial: 79 mmHg — ABNORMAL LOW (ref 80.0–100.0)

## 2013-07-02 LAB — POCT I-STAT 4, (NA,K, GLUC, HGB,HCT)
Glucose, Bld: 117 mg/dL — ABNORMAL HIGH (ref 70–99)
Glucose, Bld: 110 mg/dL — ABNORMAL HIGH (ref 70–99)
Glucose, Bld: 98 mg/dL (ref 70–99)
Glucose, Bld: 95 mg/dL (ref 70–99)
Glucose, Bld: 147 mg/dL — ABNORMAL HIGH (ref 70–99)
Glucose, Bld: 138 mg/dL — ABNORMAL HIGH (ref 70–99)
HCT: 27 % — ABNORMAL LOW (ref 36.0–46.0)
HCT: 24 % — ABNORMAL LOW (ref 36.0–46.0)
HCT: 26 % — ABNORMAL LOW (ref 36.0–46.0)
HCT: 26 % — ABNORMAL LOW (ref 36.0–46.0)
HCT: 28 % — ABNORMAL LOW (ref 36.0–46.0)
HCT: 33 % — ABNORMAL LOW (ref 36.0–46.0)
Hemoglobin: 9.2 g/dL — ABNORMAL LOW (ref 12.0–15.0)
Hemoglobin: 8.2 g/dL — ABNORMAL LOW (ref 12.0–15.0)
Hemoglobin: 8.8 g/dL — ABNORMAL LOW (ref 12.0–15.0)
Hemoglobin: 8.8 g/dL — ABNORMAL LOW (ref 12.0–15.0)
Hemoglobin: 9.5 g/dL — ABNORMAL LOW (ref 12.0–15.0)
Hemoglobin: 11.2 g/dL — ABNORMAL LOW (ref 12.0–15.0)
Potassium: 3.7 mEq/L (ref 3.7–5.3)
Potassium: 3.5 mEq/L — ABNORMAL LOW (ref 3.7–5.3)
Potassium: 3.8 mEq/L (ref 3.7–5.3)
Potassium: 4.7 mEq/L (ref 3.7–5.3)
Potassium: 3.9 mEq/L (ref 3.7–5.3)
Potassium: 3.5 mEq/L — ABNORMAL LOW (ref 3.7–5.3)
Sodium: 139 mEq/L (ref 137–147)
Sodium: 137 mEq/L (ref 137–147)
Sodium: 139 mEq/L (ref 137–147)
Sodium: 137 mEq/L (ref 137–147)
Sodium: 139 mEq/L (ref 137–147)
Sodium: 141 mEq/L (ref 137–147)

## 2013-07-02 LAB — SPIROMETRY WITH GRAPH
FEF 25-75 Post: 0.54 L/sec
FEF 25-75 Pre: 0.59 L/sec
FEF2575-%Change-Post: -9 %
FEF2575-%Pred-Post: 22 %
FEF2575-%Pred-Pre: 25 %
FEV1-%Change-Post: -4 %
FEV1-%Pred-Post: 33 %
FEV1-%Pred-Pre: 35 %
FEV1-Post: 0.87 L
FEV1-Pre: 0.91 L
FEV1FVC-%Change-Post: 7 %
FEV1FVC-%Pred-Pre: 95 %
FEV6-%Change-Post: -11 %
FEV6-%Pred-Post: 34 %
FEV6-%Pred-Pre: 38 %
FEV6-Post: 1.09 L
FEV6-Pre: 1.22 L
FEV6FVC-%Pred-Post: 104 %
FEV6FVC-%Pred-Pre: 104 %
FVC-%Change-Post: -11 %
FVC-%Pred-Post: 32 %
FVC-%Pred-Pre: 36 %
FVC-Post: 1.09 L
FVC-Pre: 1.22 L
Post FEV1/FVC ratio: 80 %
Post FEV6/FVC ratio: 100 %
Pre FEV1/FVC ratio: 75 %
Pre FEV6/FVC Ratio: 100 %

## 2013-07-02 LAB — CREATININE, SERUM
Creatinine, Ser: 1.06 mg/dL (ref 0.50–1.10)
GFR calc Af Amer: 65 mL/min — ABNORMAL LOW (ref >90)
GFR calc non Af Amer: 56 mL/min — ABNORMAL LOW (ref >90)

## 2013-07-02 LAB — GLUCOSE, CAPILLARY
Glucose-Capillary: 108 mg/dL — ABNORMAL HIGH (ref 70–99)
Glucose-Capillary: 131 mg/dL — ABNORMAL HIGH (ref 70–99)
Glucose-Capillary: 81 mg/dL (ref 70–99)

## 2013-07-02 LAB — HEMOGLOBIN AND HEMATOCRIT, BLOOD
HCT: 26.8 % — ABNORMAL LOW (ref 36.0–46.0)
Hemoglobin: 9.2 g/dL — ABNORMAL LOW (ref 12.0–15.0)

## 2013-07-02 LAB — PLATELET COUNT: Platelets: 132 10*3/uL — ABNORMAL LOW (ref 150–400)

## 2013-07-02 LAB — PROTIME-INR
INR: 1.48 (ref 0.00–1.49)
Prothrombin Time: 17.5 seconds — ABNORMAL HIGH (ref 11.6–15.2)

## 2013-07-02 LAB — PREPARE RBC (CROSSMATCH)

## 2013-07-02 LAB — MAGNESIUM: Magnesium: 3.1 mg/dL — ABNORMAL HIGH (ref 1.5–2.5)

## 2013-07-02 LAB — APTT: aPTT: 34 seconds (ref 24–37)

## 2013-07-02 SURGERY — CORONARY ARTERY BYPASS GRAFTING (CABG)
Anesthesia: General | Site: Chest

## 2013-07-02 MED ORDER — OXYCODONE HCL 5 MG PO TABS
5.0000 mg | ORAL_TABLET | ORAL | Status: DC | PRN
  Administered 2013-07-03 (×2): 5 mg via ORAL
  Filled 2013-07-02 (×2): qty 1

## 2013-07-02 MED ORDER — SODIUM CHLORIDE 0.9 % IV SOLN
INTRAVENOUS | Status: DC
  Administered 2013-07-05: 22:00:00 via INTRAVENOUS

## 2013-07-02 MED ORDER — BISACODYL 10 MG RE SUPP
10.0000 mg | Freq: Every day | RECTAL | Status: DC
  Administered 2013-07-07: 10 mg via RECTAL
  Filled 2013-07-02: qty 1

## 2013-07-02 MED ORDER — LACTATED RINGERS IV SOLN: INTRAVENOUS | Status: DC

## 2013-07-02 MED ORDER — METOPROLOL TARTRATE 1 MG/ML IV SOLN: 2.5000 mg | INTRAVENOUS | Status: DC | PRN

## 2013-07-02 MED ORDER — ACETAMINOPHEN 160 MG/5ML PO SOLN: 1000.0000 mg | Freq: Four times a day (QID) | ORAL | Status: AC

## 2013-07-02 MED ORDER — FENTANYL CITRATE 0.05 MG/ML IJ SOLN
INTRAMUSCULAR | Status: AC
  Filled 2013-07-02: qty 5

## 2013-07-02 MED ORDER — LIDOCAINE HCL (CARDIAC) 20 MG/ML IV SOLN
INTRAVENOUS | Status: DC | PRN
  Administered 2013-07-02: 80 mg via INTRAVENOUS

## 2013-07-02 MED ORDER — PROTAMINE SULFATE 10 MG/ML IV SOLN
INTRAVENOUS | Status: DC | PRN
  Administered 2013-07-02: 200 mg via INTRAVENOUS

## 2013-07-02 MED ORDER — LIDOCAINE HCL (CARDIAC) 20 MG/ML IV SOLN
INTRAVENOUS | Status: AC
  Filled 2013-07-02: qty 5

## 2013-07-02 MED ORDER — PHENYLEPHRINE 40 MCG/ML (10ML) SYRINGE FOR IV PUSH (FOR BLOOD PRESSURE SUPPORT)
PREFILLED_SYRINGE | INTRAVENOUS | Status: AC
  Filled 2013-07-02: qty 10

## 2013-07-02 MED ORDER — FENTANYL CITRATE 0.05 MG/ML IJ SOLN
INTRAMUSCULAR | Status: DC | PRN
  Administered 2013-07-02: 50 ug via INTRAVENOUS
  Administered 2013-07-02: 250 ug via INTRAVENOUS
  Administered 2013-07-02: 500 ug via INTRAVENOUS
  Administered 2013-07-02: 250 ug via INTRAVENOUS
  Administered 2013-07-02 (×2): 50 ug via INTRAVENOUS

## 2013-07-02 MED ORDER — HEMOSTATIC AGENTS (NO CHARGE) OPTIME
TOPICAL | Status: DC | PRN
Start: 2013-07-02 — End: 2013-07-02
  Administered 2013-07-02: 1 via TOPICAL

## 2013-07-02 MED ORDER — SODIUM CHLORIDE 0.9 % IJ SOLN
OROMUCOSAL | Status: DC | PRN
  Administered 2013-07-02 (×3): via TOPICAL

## 2013-07-02 MED ORDER — ALBUMIN HUMAN 5 % IV SOLN
INTRAVENOUS | Status: DC | PRN
  Administered 2013-07-02 (×3): via INTRAVENOUS

## 2013-07-02 MED ORDER — SODIUM CHLORIDE 0.45 % IV SOLN
INTRAVENOUS | Status: DC
  Administered 2013-07-02: 14:00:00 via INTRAVENOUS

## 2013-07-02 MED ORDER — ACETAMINOPHEN 500 MG PO TABS
1000.0000 mg | ORAL_TABLET | Freq: Four times a day (QID) | ORAL | Status: AC
  Administered 2013-07-02 – 2013-07-07 (×18): 1000 mg via ORAL
  Filled 2013-07-02 (×21): qty 2

## 2013-07-02 MED ORDER — BISACODYL 5 MG PO TBEC
10.0000 mg | DELAYED_RELEASE_TABLET | Freq: Every day | ORAL | Status: DC
  Administered 2013-07-03 – 2013-07-08 (×4): 10 mg via ORAL
  Filled 2013-07-02 (×5): qty 2

## 2013-07-02 MED ORDER — ACETAMINOPHEN 160 MG/5ML PO SOLN: 650.0000 mg | Freq: Once | ORAL | Status: AC

## 2013-07-02 MED ORDER — ASPIRIN EC 325 MG PO TBEC
325.0000 mg | DELAYED_RELEASE_TABLET | Freq: Every day | ORAL | Status: DC
  Administered 2013-07-03 – 2013-07-08 (×6): 325 mg via ORAL
  Filled 2013-07-02 (×7): qty 1

## 2013-07-02 MED ORDER — MILRINONE IN DEXTROSE 20 MG/100ML IV SOLN
0.3000 ug/kg/min | INTRAVENOUS | Status: DC
  Administered 2013-07-02: 0.2 ug/kg/min via INTRAVENOUS
  Filled 2013-07-02: qty 100

## 2013-07-02 MED ORDER — FAMOTIDINE IN NACL 20-0.9 MG/50ML-% IV SOLN
20.0000 mg | Freq: Two times a day (BID) | INTRAVENOUS | Status: DC
  Administered 2013-07-02: 20 mg via INTRAVENOUS

## 2013-07-02 MED ORDER — PROTAMINE SULFATE 10 MG/ML IV SOLN
INTRAVENOUS | Status: AC
  Filled 2013-07-02: qty 25

## 2013-07-02 MED ORDER — LACTATED RINGERS IV SOLN
INTRAVENOUS | Status: DC | PRN
  Administered 2013-07-02: 07:00:00 via INTRAVENOUS

## 2013-07-02 MED ORDER — MAGNESIUM SULFATE 4000MG/100ML IJ SOLN
4.0000 g | Freq: Once | INTRAMUSCULAR | Status: AC
  Administered 2013-07-02: 4 g via INTRAVENOUS
  Filled 2013-07-02: qty 100

## 2013-07-02 MED ORDER — DOCUSATE SODIUM 100 MG PO CAPS
200.0000 mg | ORAL_CAPSULE | Freq: Every day | ORAL | Status: DC
  Administered 2013-07-03 – 2013-07-14 (×8): 200 mg via ORAL
  Filled 2013-07-02 (×12): qty 2

## 2013-07-02 MED ORDER — SODIUM CHLORIDE 0.9 % IV SOLN
INTRAVENOUS | Status: DC
  Administered 2013-07-02: 1.6 [IU]/h via INTRAVENOUS
  Filled 2013-07-02 (×2): qty 1

## 2013-07-02 MED ORDER — SODIUM CHLORIDE 0.9 % IJ SOLN
INTRAMUSCULAR | Status: AC
  Filled 2013-07-02: qty 10

## 2013-07-02 MED ORDER — DOPAMINE-DEXTROSE 3.2-5 MG/ML-% IV SOLN: 0.0000 ug/kg/min | INTRAVENOUS | Status: DC

## 2013-07-02 MED ORDER — DEXMEDETOMIDINE HCL IN NACL 200 MCG/50ML IV SOLN: 0.1000 ug/kg/h | INTRAVENOUS | Status: DC

## 2013-07-02 MED ORDER — MORPHINE SULFATE 2 MG/ML IJ SOLN: 2.0000 mg | INTRAMUSCULAR | Status: DC | PRN

## 2013-07-02 MED ORDER — PROPOFOL 10 MG/ML IV BOLUS
INTRAVENOUS | Status: AC
  Filled 2013-07-02: qty 20

## 2013-07-02 MED ORDER — METOPROLOL TARTRATE 25 MG/10 ML ORAL SUSPENSION
12.5000 mg | Freq: Two times a day (BID) | ORAL | Status: DC
  Filled 2013-07-02 (×9): qty 5

## 2013-07-02 MED ORDER — PANTOPRAZOLE SODIUM 40 MG PO TBEC
40.0000 mg | DELAYED_RELEASE_TABLET | Freq: Every day | ORAL | Status: DC
  Administered 2013-07-04 – 2013-07-14 (×10): 40 mg via ORAL
  Filled 2013-07-02 (×9): qty 1

## 2013-07-02 MED ORDER — ALBUMIN HUMAN 5 % IV SOLN
250.0000 mL | INTRAVENOUS | Status: AC | PRN
  Administered 2013-07-02 (×2): 250 mL via INTRAVENOUS

## 2013-07-02 MED ORDER — ACETAMINOPHEN 650 MG RE SUPP
650.0000 mg | Freq: Once | RECTAL | Status: AC
  Administered 2013-07-02: 650 mg via RECTAL

## 2013-07-02 MED ORDER — MIDAZOLAM HCL 2 MG/2ML IJ SOLN: 2.0000 mg | INTRAMUSCULAR | Status: DC | PRN

## 2013-07-02 MED ORDER — NITROGLYCERIN IN D5W 200-5 MCG/ML-% IV SOLN: 0.0000 ug/min | INTRAVENOUS | Status: DC

## 2013-07-02 MED ORDER — HEMOSTATIC AGENTS (NO CHARGE) OPTIME
TOPICAL | Status: DC | PRN
  Administered 2013-07-02: 1 via TOPICAL

## 2013-07-02 MED ORDER — ROCURONIUM BROMIDE 50 MG/5ML IV SOLN
INTRAVENOUS | Status: AC
  Filled 2013-07-02: qty 1

## 2013-07-02 MED ORDER — PHENYLEPHRINE HCL 10 MG/ML IJ SOLN
0.0000 ug/min | INTRAVENOUS | Status: DC
  Administered 2013-07-03: 30 ug/min via INTRAVENOUS
  Filled 2013-07-02 (×4): qty 2

## 2013-07-02 MED ORDER — METOPROLOL TARTRATE 12.5 MG HALF TABLET
12.5000 mg | ORAL_TABLET | Freq: Two times a day (BID) | ORAL | Status: DC
  Administered 2013-07-04 – 2013-07-06 (×2): 12.5 mg via ORAL
  Filled 2013-07-02 (×9): qty 1

## 2013-07-02 MED ORDER — ONDANSETRON HCL 4 MG/2ML IJ SOLN
4.0000 mg | Freq: Four times a day (QID) | INTRAMUSCULAR | Status: DC | PRN
  Administered 2013-07-02 – 2013-07-09 (×7): 4 mg via INTRAVENOUS
  Filled 2013-07-02 (×7): qty 2

## 2013-07-02 MED ORDER — POTASSIUM CHLORIDE 10 MEQ/50ML IV SOLN
10.0000 meq | Freq: Once | INTRAVENOUS | Status: AC
  Administered 2013-07-02: 10 meq via INTRAVENOUS

## 2013-07-02 MED ORDER — POTASSIUM CHLORIDE 10 MEQ/50ML IV SOLN
10.0000 meq | INTRAVENOUS | Status: AC
  Administered 2013-07-02 (×3): 10 meq via INTRAVENOUS

## 2013-07-02 MED ORDER — HEPARIN SODIUM (PORCINE) 1000 UNIT/ML IJ SOLN
INTRAMUSCULAR | Status: DC | PRN
  Administered 2013-07-02: 20000 [IU] via INTRAVENOUS
  Administered 2013-07-02: 5000 [IU] via INTRAVENOUS

## 2013-07-02 MED ORDER — MIDAZOLAM HCL 10 MG/2ML IJ SOLN
INTRAMUSCULAR | Status: AC
  Filled 2013-07-02: qty 2

## 2013-07-02 MED ORDER — VECURONIUM BROMIDE 10 MG IV SOLR
INTRAVENOUS | Status: DC | PRN
  Administered 2013-07-02: 8 mg via INTRAVENOUS
  Administered 2013-07-02: 4 mg via INTRAVENOUS

## 2013-07-02 MED ORDER — HEPARIN SODIUM (PORCINE) 1000 UNIT/ML IJ SOLN
INTRAMUSCULAR | Status: AC
  Filled 2013-07-02: qty 1

## 2013-07-02 MED ORDER — MILRINONE IN DEXTROSE 20 MG/100ML IV SOLN
0.1250 ug/kg/min | INTRAVENOUS | Status: DC
  Administered 2013-07-02: .33 ug/kg/min via INTRAVENOUS
  Filled 2013-07-02: qty 100

## 2013-07-02 MED ORDER — INSULIN REGULAR BOLUS VIA INFUSION
0.0000 [IU] | Freq: Three times a day (TID) | INTRAVENOUS | Status: DC
Start: 2013-07-02 — End: 2013-07-04
  Filled 2013-07-02: qty 10

## 2013-07-02 MED ORDER — ASPIRIN 81 MG PO CHEW: 324.0000 mg | CHEWABLE_TABLET | Freq: Every day | ORAL | Status: DC

## 2013-07-02 MED ORDER — ROCURONIUM BROMIDE 100 MG/10ML IV SOLN
INTRAVENOUS | Status: DC | PRN
  Administered 2013-07-02: 100 mg via INTRAVENOUS

## 2013-07-02 MED ORDER — MIDAZOLAM HCL 5 MG/5ML IJ SOLN
INTRAMUSCULAR | Status: DC | PRN
  Administered 2013-07-02: 2 mg via INTRAVENOUS
  Administered 2013-07-02: 3 mg via INTRAVENOUS

## 2013-07-02 MED ORDER — MORPHINE SULFATE 2 MG/ML IJ SOLN
1.0000 mg | INTRAMUSCULAR | Status: DC | PRN
  Filled 2013-07-02: qty 2

## 2013-07-02 MED ORDER — SODIUM CHLORIDE 0.9 % IJ SOLN: 3.0000 mL | INTRAMUSCULAR | Status: DC | PRN

## 2013-07-02 MED ORDER — SODIUM CHLORIDE 0.9 % IR SOLN
Status: DC | PRN
  Administered 2013-07-02: 6000 mL

## 2013-07-02 MED ORDER — VANCOMYCIN HCL IN DEXTROSE 1-5 GM/200ML-% IV SOLN
1000.0000 mg | Freq: Once | INTRAVENOUS | Status: AC
  Administered 2013-07-02: 1000 mg via INTRAVENOUS
  Filled 2013-07-02: qty 200

## 2013-07-02 MED ORDER — PROPOFOL 10 MG/ML IV BOLUS
INTRAVENOUS | Status: DC | PRN
  Administered 2013-07-02: 100 mg via INTRAVENOUS

## 2013-07-02 MED ORDER — LACTATED RINGERS IV SOLN: 500.0000 mL | Freq: Once | INTRAVENOUS | Status: AC | PRN

## 2013-07-02 MED ORDER — LACTATED RINGERS IV SOLN
INTRAVENOUS | Status: DC | PRN
  Administered 2013-07-02 (×2): via INTRAVENOUS

## 2013-07-02 MED ORDER — SODIUM CHLORIDE 0.9 % IV SOLN: 250.0000 mL | INTRAVENOUS | Status: DC

## 2013-07-02 MED ORDER — DEXTROSE 5 % IV SOLN
1.5000 g | Freq: Two times a day (BID) | INTRAVENOUS | Status: AC
  Administered 2013-07-02 – 2013-07-04 (×4): 1.5 g via INTRAVENOUS
  Filled 2013-07-02 (×4): qty 1.5

## 2013-07-02 MED ORDER — SODIUM CHLORIDE 0.9 % IJ SOLN
3.0000 mL | Freq: Two times a day (BID) | INTRAMUSCULAR | Status: DC
  Administered 2013-07-03 – 2013-07-12 (×10): 3 mL via INTRAVENOUS

## 2013-07-02 MED FILL — Sodium Bicarbonate IV Soln 8.4%: INTRAVENOUS | Qty: 50 | Status: AC

## 2013-07-02 MED FILL — Lidocaine HCl IV Inj 20 MG/ML: INTRAVENOUS | Qty: 5 | Status: AC

## 2013-07-02 MED FILL — Heparin Sodium (Porcine) Inj 1000 Unit/ML: INTRAMUSCULAR | Qty: 10 | Status: AC

## 2013-07-02 MED FILL — Sodium Chloride IV Soln 0.9%: INTRAVENOUS | Qty: 2000 | Status: AC

## 2013-07-02 MED FILL — Mannitol IV Soln 20%: INTRAVENOUS | Qty: 500 | Status: AC

## 2013-07-02 MED FILL — Electrolyte-R (PH 7.4) Solution: INTRAVENOUS | Qty: 6000 | Status: AC

## 2013-07-02 SURGICAL SUPPLY — 125 items
ADAPTER CARDIO PERF ANTE/RETRO (ADAPTER) ×8 IMPLANT
APPLICATOR COTTON TIP 6IN STRL (MISCELLANEOUS) IMPLANT
ATTRACTOMAT 16X20 MAGNETIC DRP (DRAPES) ×8 IMPLANT
BAG DECANTER FOR FLEXI CONT (MISCELLANEOUS) ×4 IMPLANT
BANDAGE ELASTIC 4 VELCRO ST LF (GAUZE/BANDAGES/DRESSINGS) ×4 IMPLANT
BANDAGE ELASTIC 6 VELCRO ST LF (GAUZE/BANDAGES/DRESSINGS) ×4 IMPLANT
BANDAGE GAUZE ELAST BULKY 4 IN (GAUZE/BANDAGES/DRESSINGS) ×4 IMPLANT
BASKET HEART  (ORDER IN 25'S) (MISCELLANEOUS) ×1
BASKET HEART (ORDER IN 25'S) (MISCELLANEOUS) ×1
BASKET HEART (ORDER IN 25S) (MISCELLANEOUS) ×2 IMPLANT
BLADE 10 SAFETY STRL DISP (BLADE) ×8 IMPLANT
BLADE NEEDLE 3 SS STRL (BLADE) ×3 IMPLANT
BLADE NEEDLE 3MM SS STRL (BLADE) ×1
BLADE STERNUM SYSTEM 6 (BLADE) ×8 IMPLANT
BLADE SURG 12 STRL SS (BLADE) ×8 IMPLANT
BLADE SURG 15 STRL LF DISP TIS (BLADE) ×2 IMPLANT
BLADE SURG 15 STRL SS (BLADE) ×2
BLADE SURG ROTATE 9660 (MISCELLANEOUS) ×4 IMPLANT
BNDG GAUZE ELAST 4 BULKY (GAUZE/BANDAGES/DRESSINGS) ×4 IMPLANT
BOOT SUTURE AID YELLOW STND (SUTURE) IMPLANT
CANISTER SUCTION 2500CC (MISCELLANEOUS) ×8 IMPLANT
CANNULA GUNDRY RCSP 15FR (MISCELLANEOUS) ×8 IMPLANT
CANNULA VENOUS LOW PROF 32X40 (CANNULA) IMPLANT
CARDIAC SUCTION (MISCELLANEOUS) IMPLANT
CATH CPB KIT VANTRIGT (MISCELLANEOUS) ×4 IMPLANT
CATH ROBINSON RED A/P 18FR (CATHETERS) ×24 IMPLANT
CATH THORACIC 36FR RT ANG (CATHETERS) ×8 IMPLANT
CLIP TI WIDE RED SMALL 24 (CLIP) ×4 IMPLANT
CONN 1/2X1/2X1/2  BEN (MISCELLANEOUS) ×4
CONN 1/2X1/2X1/2 BEN (MISCELLANEOUS) ×4 IMPLANT
CONN 3/8X1/2 ST GISH (MISCELLANEOUS) ×16 IMPLANT
COVER SURGICAL LIGHT HANDLE (MISCELLANEOUS) ×12 IMPLANT
CRADLE DONUT ADULT HEAD (MISCELLANEOUS) ×8 IMPLANT
DERMABOND ADVANCED (GAUZE/BANDAGES/DRESSINGS) ×2
DERMABOND ADVANCED .7 DNX12 (GAUZE/BANDAGES/DRESSINGS) ×2 IMPLANT
DRAIN CHANNEL 32F RND 10.7 FF (WOUND CARE) ×4 IMPLANT
DRAPE CARDIOVASCULAR INCISE (DRAPES) ×2
DRAPE SLUSH/WARMER DISC (DRAPES) ×8 IMPLANT
DRAPE SRG 135X102X78XABS (DRAPES) ×2 IMPLANT
DRSG AQUACEL AG ADV 3.5X14 (GAUZE/BANDAGES/DRESSINGS) ×4 IMPLANT
ELECT BLADE 4.0 EZ CLEAN MEGAD (MISCELLANEOUS) ×4
ELECT BLADE 6.5 EXT (BLADE) ×8 IMPLANT
ELECT CAUTERY BLADE 6.4 (BLADE) ×8 IMPLANT
ELECT REM PT RETURN 9FT ADLT (ELECTROSURGICAL) ×16
ELECTRODE BLDE 4.0 EZ CLN MEGD (MISCELLANEOUS) ×2 IMPLANT
ELECTRODE REM PT RTRN 9FT ADLT (ELECTROSURGICAL) ×8 IMPLANT
GLOVE BIO SURGEON STRL SZ 6 (GLOVE) IMPLANT
GLOVE BIO SURGEON STRL SZ 6.5 (GLOVE) ×15 IMPLANT
GLOVE BIO SURGEON STRL SZ7.5 (GLOVE) ×8 IMPLANT
GLOVE BIO SURGEONS STRL SZ 6.5 (GLOVE) ×5
GLOVE BIOGEL PI IND STRL 6 (GLOVE) ×4 IMPLANT
GLOVE BIOGEL PI INDICATOR 6 (GLOVE) ×4
GOWN STRL REUS W/ TWL LRG LVL3 (GOWN DISPOSABLE) ×12 IMPLANT
GOWN STRL REUS W/TWL LRG LVL3 (GOWN DISPOSABLE) ×12
HEMOSTAT POWDER SURGIFOAM 1G (HEMOSTASIS) ×12 IMPLANT
HEMOSTAT SURGICEL 2X14 (HEMOSTASIS) ×4 IMPLANT
INSERT FOGARTY XLG (MISCELLANEOUS) IMPLANT
KIT BASIN OR (CUSTOM PROCEDURE TRAY) ×8 IMPLANT
KIT ROOM TURNOVER OR (KITS) ×8 IMPLANT
KIT SUCTION CATH 14FR (SUCTIONS) ×8 IMPLANT
KIT VASOVIEW W/TROCAR VH 2000 (KITS) ×4 IMPLANT
LEAD PACING MYOCARDI (MISCELLANEOUS) ×4 IMPLANT
LINE VENT (MISCELLANEOUS) ×4 IMPLANT
LOOP VESSEL SUPERMAXI WHITE (MISCELLANEOUS) ×4 IMPLANT
MARKER GRAFT CORONARY BYPASS (MISCELLANEOUS) ×12 IMPLANT
MARKER SKIN DUAL TIP RULER LAB (MISCELLANEOUS) ×4 IMPLANT
NS IRRIG 1000ML POUR BTL (IV SOLUTION) ×44 IMPLANT
PACK OPEN HEART (CUSTOM PROCEDURE TRAY) ×8 IMPLANT
PAD ARMBOARD 7.5X6 YLW CONV (MISCELLANEOUS) ×16 IMPLANT
PAD ELECT DEFIB RADIOL ZOLL (MISCELLANEOUS) ×4 IMPLANT
PENCIL BUTTON HOLSTER BLD 10FT (ELECTRODE) ×4 IMPLANT
PUNCH AORTIC ROTATE 4.0MM (MISCELLANEOUS) IMPLANT
PUNCH AORTIC ROTATE 4.5MM 8IN (MISCELLANEOUS) ×4 IMPLANT
PUNCH AORTIC ROTATE 5MM 8IN (MISCELLANEOUS) IMPLANT
SET CARDIOPLEGIA MPS 5001102 (MISCELLANEOUS) ×4 IMPLANT
SPONGE GAUZE 4X4 12PLY (GAUZE/BANDAGES/DRESSINGS) ×8 IMPLANT
SPONGE GAUZE 4X4 12PLY STER LF (GAUZE/BANDAGES/DRESSINGS) ×8 IMPLANT
SPONGE LAP 18X18 X RAY DECT (DISPOSABLE) ×8 IMPLANT
SPONGE LAP 4X18 X RAY DECT (DISPOSABLE) ×4 IMPLANT
SUCKER INTRACARDIAC WEIGHTED (SUCKER) ×4 IMPLANT
SUCKER WEIGHTED FLEX (MISCELLANEOUS) IMPLANT
SURGIFLO W/THROMBIN 8M KIT (HEMOSTASIS) ×4 IMPLANT
SUT BONE WAX W31G (SUTURE) ×8 IMPLANT
SUT ETHIBOND 2 0 SH (SUTURE) ×4 IMPLANT
SUT ETHIBOND 2 0 SH 36X2 (SUTURE) ×4 IMPLANT
SUT ETHIBOND 2 0 V4 (SUTURE) IMPLANT
SUT ETHIBOND 2 0V4 GREEN (SUTURE) IMPLANT
SUT ETHIBOND 4 0 TF (SUTURE) IMPLANT
SUT ETHIBOND 5 0 C 1 30 (SUTURE) IMPLANT
SUT MNCRL AB 4-0 PS2 18 (SUTURE) ×8 IMPLANT
SUT PROLENE 3 0 RB 1 (SUTURE) ×4 IMPLANT
SUT PROLENE 3 0 SH 1 (SUTURE) ×4 IMPLANT
SUT PROLENE 3 0 SH DA (SUTURE) ×4 IMPLANT
SUT PROLENE 3 0 SH1 36 (SUTURE) IMPLANT
SUT PROLENE 4 0 RB 1 (SUTURE) ×10
SUT PROLENE 4 0 SH DA (SUTURE) ×8 IMPLANT
SUT PROLENE 4-0 RB1 .5 CRCL 36 (SUTURE) ×10 IMPLANT
SUT PROLENE 5 0 C 1 36 (SUTURE) ×4 IMPLANT
SUT PROLENE 6 0 C 1 30 (SUTURE) ×12 IMPLANT
SUT PROLENE 6 0 CC (SUTURE) ×12 IMPLANT
SUT PROLENE 8 0 BV175 6 (SUTURE) IMPLANT
SUT PROLENE BLUE 7 0 (SUTURE) ×4 IMPLANT
SUT SILK  1 MH (SUTURE)
SUT SILK 1 MH (SUTURE) IMPLANT
SUT SILK 2 0 SH CR/8 (SUTURE) ×4 IMPLANT
SUT SILK 3 0 SH CR/8 (SUTURE) IMPLANT
SUT STEEL 6MS V (SUTURE) ×16 IMPLANT
SUT STEEL SZ 6 DBL 3X14 BALL (SUTURE) ×8 IMPLANT
SUT VIC AB 1 CTX 36 (SUTURE) ×8
SUT VIC AB 1 CTX36XBRD ANBCTR (SUTURE) ×8 IMPLANT
SUT VIC AB 2-0 CT1 27 (SUTURE) ×4
SUT VIC AB 2-0 CT1 TAPERPNT 27 (SUTURE) ×4 IMPLANT
SUT VIC AB 2-0 CTX 27 (SUTURE) IMPLANT
SUT VIC AB 3-0 SH 18 (SUTURE) ×4 IMPLANT
SUT VIC AB 3-0 X1 27 (SUTURE) ×4 IMPLANT
SUTURE E-PAK OPEN HEART (SUTURE) ×8 IMPLANT
SYSTEM SAHARA CHEST DRAIN ATS (WOUND CARE) ×8 IMPLANT
TAPE CLOTH SURG 4X10 WHT LF (GAUZE/BANDAGES/DRESSINGS) ×4 IMPLANT
TAPE PAPER 2X10 WHT MICROPORE (GAUZE/BANDAGES/DRESSINGS) ×4 IMPLANT
TOWEL OR 17X24 6PK STRL BLUE (TOWEL DISPOSABLE) ×8 IMPLANT
TOWEL OR 17X26 10 PK STRL BLUE (TOWEL DISPOSABLE) ×20 IMPLANT
TRAY FOLEY IC TEMP SENS 16FR (CATHETERS) ×4 IMPLANT
TUBING INSUFFLATION 10FT LAP (TUBING) IMPLANT
UNDERPAD 30X30 INCONTINENT (UNDERPADS AND DIAPERS) ×4 IMPLANT
WATER STERILE IRR 1000ML POUR (IV SOLUTION) ×8 IMPLANT

## 2013-07-02 NOTE — Progress Notes (Signed)
The patient was examined and preop studies reviewed. There has been no change from the prior exam and the patient is ready for surgery.   Plan CABG, possible MV repair on L Reynold Bowen today

## 2013-07-02 NOTE — Progress Notes (Signed)
TCTS BRIEF SICU PROGRESS NOTE  Day of Surgery  S/P Procedure(s) (LRB): CORONARY ARTERY BYPASS GRAFTING (CABG) (N/A) INTRAOPERATIVE TRANSESOPHAGEAL ECHOCARDIOGRAM (N/A)   Extubated uneventfully NSR w/ stable hemodynamics on dopamine, milrionone and neo drips Chest tube output low UOP adequate Labs okay  Plan: Continue routine early postop  Rexene Alberts 07/02/2013 7:55 PM

## 2013-07-02 NOTE — Anesthesia Postprocedure Evaluation (Signed)
  Anesthesia Post-op Note  Patient: Jamie Arias  Procedure(s) Performed: Procedure(s) with comments: CORONARY ARTERY BYPASS GRAFTING (CABG) (N/A) - CABG x three, using left internal mammary artery and right leg greater saphenous vein harvested endoscopically INTRAOPERATIVE TRANSESOPHAGEAL ECHOCARDIOGRAM (N/A)  Patient Location: SICU  Anesthesia Type:General  Level of Consciousness: sedated, unresponsive and Patient remains intubated per anesthesia plan  Airway and Oxygen Therapy: Patient remains intubated per anesthesia plan and Patient placed on Ventilator (see vital sign flow sheet for setting)  Post-op Pain: none  Post-op Assessment: Post-op Vital signs reviewed, Patient's Cardiovascular Status Stable, Respiratory Function Stable, Patent Airway and No signs of Nausea or vomiting  Post-op Vital Signs: Reviewed and stable  Last Vitals:  Filed Vitals:   07/02/13 0507  BP: 137/62  Pulse:   Temp:   Resp:     Complications: No apparent anesthesia complications

## 2013-07-02 NOTE — Anesthesia Preprocedure Evaluation (Addendum)
Anesthesia Evaluation  Patient identified by MRN, date of birth, ID band Patient awake    Reviewed: Allergy & Precautions  Airway Mallampati: II TM Distance: >3 FB Neck ROM: Full    Dental  (+) Missing, Dental Advisory Given, Edentulous Upper, Edentulous Lower   Pulmonary neg pulmonary ROS,    Pulmonary exam normal       Cardiovascular hypertension, + Past MI  EF 35%   Neuro/Psych negative neurological ROS  negative psych ROS   GI/Hepatic negative GI ROS, Neg liver ROS,   Endo/Other  diabetes, Type 2Hypothyroidism   Renal/GU negative Renal ROS     Musculoskeletal   Abdominal   Peds  Hematology   Anesthesia Other Findings   Reproductive/Obstetrics                         Anesthesia Physical Anesthesia Plan  ASA: IV  Anesthesia Plan: General   Post-op Pain Management:    Induction: Intravenous  Airway Management Planned:   Additional Equipment: PA Cath, Arterial line and TEE  Intra-op Plan:   Post-operative Plan: Post-operative intubation/ventilation  Informed Consent: I have reviewed the patients History and Physical, chart, labs and discussed the procedure including the risks, benefits and alternatives for the proposed anesthesia with the patient or authorized representative who has indicated his/her understanding and acceptance.   Dental advisory given  Plan Discussed with: CRNA, Anesthesiologist and Surgeon  Anesthesia Plan Comments:        Anesthesia Quick Evaluation

## 2013-07-02 NOTE — Transfer of Care (Signed)
Immediate Anesthesia Transfer of Care Note  Patient: Jamie Arias  Procedure(s) Performed: Procedure(s) with comments: CORONARY ARTERY BYPASS GRAFTING (CABG) (N/A) - CABG x three, using left internal mammary artery and right leg greater saphenous vein harvested endoscopically INTRAOPERATIVE TRANSESOPHAGEAL ECHOCARDIOGRAM (N/A)  Patient Location: SICU  Anesthesia Type:General  Level of Consciousness: sedated, unresponsive and Patient remains intubated per anesthesia plan  Airway & Oxygen Therapy: Patient remains intubated per anesthesia plan and Patient placed on Ventilator (see vital sign flow sheet for setting)  Post-op Assessment: Report given to PACU RN and Post -op Vital signs reviewed and stable  Post vital signs: Reviewed and stable  Complications: No apparent anesthesia complications

## 2013-07-02 NOTE — Procedures (Signed)
Extubation Procedure Note  Patient Details:   Name: Litzzy Wain DOB: [birth year] MRN: [id number]   Airway Documentation:     Evaluation  O2 sats: stable throughout Complications: No apparent complications Patient did tolerate procedure well. Bilateral Breath Sounds: Clear;Diminished   Yes  Tolerated rapid wean, achieved 935mL VC, -35 NIF, positive cuff leak. Extubated to 4lpm [place]. Demonstrated IS x5 achieving around 567mL. No stridor or dyspnea noted after extubation. RT will continue to monitor.   Mariam Dollar 07/02/2013, 6:54 PM

## 2013-07-02 NOTE — Progress Notes (Signed)
  Echocardiogram Echocardiogram Transesophageal has been performed.  Jamie Arias Jamie Arias 07/02/2013, 8:51 AM

## 2013-07-02 NOTE — Anesthesia Procedure Notes (Signed)
Procedures   R IJ CVP with PA Catheter by Lillia Abed MD

## 2013-07-02 NOTE — Progress Notes (Signed)
Patient was extubated at 1815, put onto 4L [place] sats98-99%. Lungs clear and diminished. C/O pain 3/10 in chest that she refused any medication for. She is alert and oriented. Attempted to dangle on the side of the bed but patient became nauseated, PRN IV Zofran administered and has expressed relief. Also states that she occasionally gets nauseated at home easily if she has an empty stomach, typically leading to dry-heaves. Held off dangle for now and will pass onto HS shift. Richardean Sale, RN

## 2013-07-02 NOTE — OR Nursing (Signed)
12:00 - 1st call to SICU, 12:35 - 2nd call to SICU

## 2013-07-02 NOTE — Brief Op Note (Signed)
06/29/2013 - 07/02/2013  11:44 AM  PATIENT:  Jamie Arias  58 y.o. female  PRE-OPERATIVE DIAGNOSIS:  1. S/p NSTEMI 2.CAD (EF 35-40%)  3. Moderate MR   POST-OPERATIVE DIAGNOSIS:  1.S/p NSTEMI 2.CAD (EF 35-40%) 3. Mild MR  PROCEDURE: INTRAOPERATIVE TRANSESOPHAGEAL ECHOCARDIOGRAM, CORONARY ARTERY BYPASS GRAFTING (CABG) x 3 (LIMA to LAD, SVG to OM, SVG to RCA)  using left internal mammary artery and right thigh leg greater saphenous vein harvested endoscopically    SURGEON:  Surgeon(s) and Role:    * Ivin [name], MD - Primary  PHYSICIAN ASSISTANT: Lars Pinks PA-C  ANESTHESIA:   general  EBL:  Total I/O In: 1900 [I.V.:1200; Blood:700] Out: 750 [Urine:750]  BLOOD ADMINISTERED:Two CC PRBC, Two FFP and One PLTS  DRAINS: Chest tubes placed in the mediastinal and pleural spaces   COUNTS CORRECT:  YES  DICTATION: .Dragon Dictation  PLAN OF CARE: Admit to inpatient   PATIENT DISPOSITION:  ICU - intubated and hemodynamically stable.   Delay start of Pharmacological VTE agent (>24hrs) due to surgical blood loss or risk of bleeding: yes  BASELINE WEIGHT: 73 kg

## 2013-07-03 ENCOUNTER — Encounter (HOSPITAL_COMMUNITY): Payer: Self-pay | Admitting: Cardiothoracic Surgery

## 2013-07-03 ENCOUNTER — Inpatient Hospital Stay (HOSPITAL_COMMUNITY): Payer: No Typology Code available for payment source

## 2013-07-03 DIAGNOSIS — E119 Type 2 diabetes mellitus without complications: Secondary | ICD-10-CM

## 2013-07-03 LAB — GLUCOSE, CAPILLARY
Glucose-Capillary: 100 mg/dL — ABNORMAL HIGH (ref 70–99)
Glucose-Capillary: 103 mg/dL — ABNORMAL HIGH (ref 70–99)
Glucose-Capillary: 107 mg/dL — ABNORMAL HIGH (ref 70–99)
Glucose-Capillary: 107 mg/dL — ABNORMAL HIGH (ref 70–99)
Glucose-Capillary: 108 mg/dL — ABNORMAL HIGH (ref 70–99)
Glucose-Capillary: 111 mg/dL — ABNORMAL HIGH (ref 70–99)
Glucose-Capillary: 112 mg/dL — ABNORMAL HIGH (ref 70–99)
Glucose-Capillary: 114 mg/dL — ABNORMAL HIGH (ref 70–99)
Glucose-Capillary: 115 mg/dL — ABNORMAL HIGH (ref 70–99)
Glucose-Capillary: 116 mg/dL — ABNORMAL HIGH (ref 70–99)
Glucose-Capillary: 118 mg/dL — ABNORMAL HIGH (ref 70–99)
Glucose-Capillary: 120 mg/dL — ABNORMAL HIGH (ref 70–99)
Glucose-Capillary: 121 mg/dL — ABNORMAL HIGH (ref 70–99)
Glucose-Capillary: 124 mg/dL — ABNORMAL HIGH (ref 70–99)
Glucose-Capillary: 125 mg/dL — ABNORMAL HIGH (ref 70–99)
Glucose-Capillary: 131 mg/dL — ABNORMAL HIGH (ref 70–99)
Glucose-Capillary: 134 mg/dL — ABNORMAL HIGH (ref 70–99)
Glucose-Capillary: 136 mg/dL — ABNORMAL HIGH (ref 70–99)
Glucose-Capillary: 137 mg/dL — ABNORMAL HIGH (ref 70–99)
Glucose-Capillary: 140 mg/dL — ABNORMAL HIGH (ref 70–99)
Glucose-Capillary: 142 mg/dL — ABNORMAL HIGH (ref 70–99)
Glucose-Capillary: 163 mg/dL — ABNORMAL HIGH (ref 70–99)
Glucose-Capillary: 180 mg/dL — ABNORMAL HIGH (ref 70–99)
Glucose-Capillary: 188 mg/dL — ABNORMAL HIGH (ref 70–99)
Glucose-Capillary: 208 mg/dL — ABNORMAL HIGH (ref 70–99)
Glucose-Capillary: 68 mg/dL — ABNORMAL LOW (ref 70–99)
Glucose-Capillary: 93 mg/dL (ref 70–99)

## 2013-07-03 LAB — POCT I-STAT, CHEM 8
BUN: 32 mg/dL — ABNORMAL HIGH (ref 6–23)
Calcium, Ion: 1.2 mmol/L (ref 1.12–1.23)
Chloride: 100 mEq/L (ref 96–112)
Creatinine, Ser: 1.4 mg/dL — ABNORMAL HIGH (ref 0.50–1.10)
Glucose, Bld: 125 mg/dL — ABNORMAL HIGH (ref 70–99)
HCT: 26 % — ABNORMAL LOW (ref 36.0–46.0)
Hemoglobin: 8.8 g/dL — ABNORMAL LOW (ref 12.0–15.0)
Potassium: 4.5 mEq/L (ref 3.7–5.3)
Sodium: 137 mEq/L (ref 137–147)
TCO2: 24 mmol/L (ref 0–100)

## 2013-07-03 LAB — BASIC METABOLIC PANEL
BUN: 31 mg/dL — ABNORMAL HIGH (ref 6–23)
CO2: 23 mEq/L (ref 19–32)
Calcium: 8 mg/dL — ABNORMAL LOW (ref 8.4–10.5)
Chloride: 103 mEq/L (ref 96–112)
Creatinine, Ser: 1.18 mg/dL — ABNORMAL HIGH (ref 0.50–1.10)
GFR calc Af Amer: 57 mL/min — ABNORMAL LOW (ref >90)
GFR calc non Af Amer: 49 mL/min — ABNORMAL LOW (ref >90)
Glucose, Bld: 114 mg/dL — ABNORMAL HIGH (ref 70–99)
Potassium: 4.4 mEq/L (ref 3.7–5.3)
Sodium: 140 mEq/L (ref 137–147)

## 2013-07-03 LAB — CREATININE, SERUM
Creatinine, Ser: 1.22 mg/dL — ABNORMAL HIGH (ref 0.50–1.10)
GFR calc Af Amer: 55 mL/min — ABNORMAL LOW (ref >90)
GFR calc non Af Amer: 48 mL/min — ABNORMAL LOW (ref >90)

## 2013-07-03 LAB — CBC
HCT: 27.8 % — ABNORMAL LOW (ref 36.0–46.0)
HCT: 26.1 % — ABNORMAL LOW (ref 36.0–46.0)
Hemoglobin: 9.7 g/dL — ABNORMAL LOW (ref 12.0–15.0)
Hemoglobin: 9 g/dL — ABNORMAL LOW (ref 12.0–15.0)
MCH: 29.2 pg (ref 26.0–34.0)
MCH: 29.4 pg (ref 26.0–34.0)
MCHC: 34.9 g/dL (ref 30.0–36.0)
MCHC: 34.5 g/dL (ref 30.0–36.0)
MCV: 83.7 fL (ref 78.0–100.0)
MCV: 85.3 fL (ref 78.0–100.0)
Platelets: 146 10*3/uL — ABNORMAL LOW (ref 150–400)
Platelets: 123 10*3/uL — ABNORMAL LOW (ref 150–400)
RBC: 3.32 MIL/uL — ABNORMAL LOW (ref 3.87–5.11)
RBC: 3.06 MIL/uL — ABNORMAL LOW (ref 3.87–5.11)
RDW: 14.7 % (ref 11.5–15.5)
RDW: 14.9 % (ref 11.5–15.5)
WBC: 9.6 10*3/uL (ref 4.0–10.5)
WBC: 9.5 10*3/uL (ref 4.0–10.5)

## 2013-07-03 LAB — MAGNESIUM
Magnesium: 2.9 mg/dL — ABNORMAL HIGH (ref 1.5–2.5)
Magnesium: 2.7 mg/dL — ABNORMAL HIGH (ref 1.5–2.5)

## 2013-07-03 MED ORDER — INSULIN DETEMIR 100 UNIT/ML ~~LOC~~ SOLN: 10.0000 [IU] | Freq: Every day | SUBCUTANEOUS | Status: DC

## 2013-07-03 MED ORDER — FUROSEMIDE 10 MG/ML IJ SOLN
40.0000 mg | Freq: Once | INTRAMUSCULAR | Status: AC
  Administered 2013-07-03: 40 mg via INTRAVENOUS

## 2013-07-03 MED ORDER — INSULIN DETEMIR 100 UNIT/ML ~~LOC~~ SOLN
14.0000 [IU] | Freq: Every day | SUBCUTANEOUS | Status: DC
  Administered 2013-07-04 – 2013-07-06 (×3): 14 [IU] via SUBCUTANEOUS
  Filled 2013-07-03 (×4): qty 0.14

## 2013-07-03 MED ORDER — INSULIN DETEMIR 100 UNIT/ML ~~LOC~~ SOLN
14.0000 [IU] | Freq: Once | SUBCUTANEOUS | Status: AC
  Administered 2013-07-03: 14 [IU] via SUBCUTANEOUS
  Filled 2013-07-03: qty 0.14

## 2013-07-03 MED ORDER — METOCLOPRAMIDE HCL 5 MG/ML IJ SOLN
10.0000 mg | Freq: Four times a day (QID) | INTRAMUSCULAR | Status: AC
  Administered 2013-07-03 – 2013-07-04 (×6): 10 mg via INTRAVENOUS
  Filled 2013-07-03 (×5): qty 2

## 2013-07-03 MED ORDER — TRAMADOL HCL 50 MG PO TABS
50.0000 mg | ORAL_TABLET | Freq: Four times a day (QID) | ORAL | Status: DC | PRN
  Administered 2013-07-03: 50 mg via ORAL
  Filled 2013-07-03: qty 1

## 2013-07-03 MED ORDER — VANCOMYCIN HCL IN DEXTROSE 1-5 GM/200ML-% IV SOLN
1000.0000 mg | Freq: Once | INTRAVENOUS | Status: AC
  Administered 2013-07-03: 1000 mg via INTRAVENOUS
  Filled 2013-07-03: qty 200

## 2013-07-03 MED ORDER — INSULIN ASPART 100 UNIT/ML ~~LOC~~ SOLN
0.0000 [IU] | SUBCUTANEOUS | Status: DC
  Administered 2013-07-03: 8 [IU] via SUBCUTANEOUS
  Administered 2013-07-04: 4 [IU] via SUBCUTANEOUS
  Administered 2013-07-04: 2 [IU] via SUBCUTANEOUS
  Administered 2013-07-04: 8 [IU] via SUBCUTANEOUS

## 2013-07-03 MED ORDER — INSULIN DETEMIR 100 UNIT/ML ~~LOC~~ SOLN
10.0000 [IU] | Freq: Once | SUBCUTANEOUS | Status: DC
  Filled 2013-07-03: qty 0.1

## 2013-07-03 NOTE — Progress Notes (Addendum)
[address]       [place] [address]             [phone]      1 Day Post-Op Procedure(s) (LRB): CORONARY ARTERY BYPASS GRAFTING (CABG) (N/A) INTRAOPERATIVE TRANSESOPHAGEAL ECHOCARDIOGRAM (N/A)  Subjective:  Ms. Vaillant complains of being tired this morning.  She also states she had an episode of nausea which has resolved.  Her pain level is minimal with only taking Tylenol for relief.  Objective: Vital signs in last 24 hours: Temp:  [95.2 F (35.1 C)-99.1 F (37.3 C)] 98.1 F (36.7 C) (05/01 0700) Pulse Rate:  [76-91] 85 (05/01 0700) Cardiac Rhythm:  [-] Normal sinus rhythm (05/01 0600) Resp:  [0-29] 12 (05/01 0700) BP: (87-116)/(44-65) 101/50 mmHg (05/01 0700) SpO2:  [91 %-99 %] 95 % (05/01 0700) Arterial Line BP: (92-148)/(42-73) 131/51 mmHg (05/01 0700) FiO2 (%):  [40 %-50 %] 40 % (04/30 1745) Weight:  [176 lb 9.4 oz (80.1 kg)] 176 lb 9.4 oz (80.1 kg) (05/01 0600)  Hemodynamic parameters for last 24 hours: PAP: (17-39)/(9-18) 20/9 mmHg CO:  [3.8 L/min-6.4 L/min] 6.4 L/min CI:  [2.2 L/min/m2-3.6 L/min/m2] 3.6 L/min/m2  Intake/Output from previous day: 04/30 0701 - 05/01 0700 In: 6272.3 [I.V.:3687.3; Blood:1035; IV Piggyback:1550] Out: 3622 [Urine:2200; Blood:800; Chest Tube:622]  General appearance: alert, cooperative and no distress Heart: regular rate and rhythm Lungs: clear to auscultation bilaterally Abdomen: soft, non-tender; bowel sounds normal; no masses,  no organomegaly Extremities: edema trace in LE, edema present in both hands Wound: clean and dry  Lab Results:  Recent Labs  07/02/13 1900 07/02/13 1904 07/03/13 0400  WBC 11.7*  --  9.6  HGB 10.9* 10.2* 9.7*  HCT 31.5* 30.0* 27.8*  PLT 151  --  146*   BMET:  Recent Labs  07/01/13 0310  07/02/13 1904 07/03/13 0400  NA 138  < > 140 140  K 3.8  < > 4.4 4.4  CL 100  --  102 103  CO2 24  --   --  23  GLUCOSE 118*  < > 112* 114*  BUN 29*  --  29* 31*  CREATININE  1.09  < > 1.20* 1.18*  CALCIUM 8.8  --   --  8.0*  < > = values in this interval not displayed.  PT/INR:  Recent Labs  07/02/13 1300  LABPROT 17.5*  INR 1.48   ABG    Component Value Date/Time   PHART 7.371 07/02/2013 1808   HCO3 25.4* 07/02/2013 1903   TCO2 26 07/02/2013 1904   O2SAT 95.0 07/02/2013 1903   CBG (last 3)   Recent Labs  07/02/13 2312 07/03/13 0010 07/03/13 0106  GLUCAP 124* 121* 125*    Assessment/Plan: S/P Procedure(s) (LRB): CORONARY ARTERY BYPASS GRAFTING (CABG) (N/A) INTRAOPERATIVE TRANSESOPHAGEAL ECHOCARDIOGRAM (N/A)  1. CV- NSR- remains on Neo, Dopamine, and Milrinone- will wean as tolerated 2. Pulm- wean oxygen as tolerated, atelectasis on CXR encouraged use of IS 3. Renal- creatinine mildly elevated, + volume overloaded weight is up 16 lbs since surgery, will give dose of Lasix 4. DM- patient's preop A1c was 7.2, CBGs controlled, will wean insulin drip start levemir, SSIP coverage 5. Expected Acute Blood loss anemia- stable Hgb 9.7 6. Dispo- patient hemodynamically stable, wean drips as tolerated, POD #1 progression orders   LOS: 4 days    Erin Barrett 07/03/2013  Wean drips Diurese mobilize OOB Start lantus with Q 4h SSI for diabetic control patient examined and  medical record reviewed,agree with above note. Tharon Aquas Trigt 07/03/2013

## 2013-07-03 NOTE — Progress Notes (Signed)
Utilization Review Completed.  

## 2013-07-03 NOTE — Progress Notes (Signed)
POD # 1 CABG  BP 110/52  Pulse 85  Temp(Src) 97.9 F (36.6 C) (Oral)  Resp 14  Ht 5\' 4"  (1.626 m)  Wt 176 lb 9.4 oz (80.1 kg)  BMI 30.30 kg/m2  SpO2 96% on 3L [place]   Intake/Output Summary (Last 24 hours) at 07/03/13 1645 Last data filed at 07/03/13 1000  Gross per 24 hour  Intake 2126.55 ml  Output   1507 ml  Net 619.55 ml   PM labs pending  Comfortable  Continue current care

## 2013-07-04 ENCOUNTER — Inpatient Hospital Stay (HOSPITAL_COMMUNITY): Payer: No Typology Code available for payment source

## 2013-07-04 DIAGNOSIS — I2510 Atherosclerotic heart disease of native coronary artery without angina pectoris: Secondary | ICD-10-CM

## 2013-07-04 LAB — TYPE AND SCREEN
ABO/RH(D): A POS
Antibody Screen: NEGATIVE
Unit division: 0
Unit division: 0
Unit division: 0
Unit division: 0
Unit division: 0
Unit division: 0

## 2013-07-04 LAB — BASIC METABOLIC PANEL
BUN: 41 mg/dL — ABNORMAL HIGH (ref 6–23)
CO2: 25 mEq/L (ref 19–32)
Calcium: 8.4 mg/dL (ref 8.4–10.5)
Chloride: 105 mEq/L (ref 96–112)
Creatinine, Ser: 1.44 mg/dL — ABNORMAL HIGH (ref 0.50–1.10)
GFR calc Af Amer: 45 mL/min — ABNORMAL LOW (ref >90)
GFR calc non Af Amer: 39 mL/min — ABNORMAL LOW (ref >90)
Glucose, Bld: 138 mg/dL — ABNORMAL HIGH (ref 70–99)
Potassium: 4.6 mEq/L (ref 3.7–5.3)
Sodium: 140 mEq/L (ref 137–147)

## 2013-07-04 LAB — GLUCOSE, CAPILLARY
Glucose-Capillary: 124 mg/dL — ABNORMAL HIGH (ref 70–99)
Glucose-Capillary: 116 mg/dL — ABNORMAL HIGH (ref 70–99)
Glucose-Capillary: 207 mg/dL — ABNORMAL HIGH (ref 70–99)
Glucose-Capillary: 222 mg/dL — ABNORMAL HIGH (ref 70–99)
Glucose-Capillary: 132 mg/dL — ABNORMAL HIGH (ref 70–99)

## 2013-07-04 LAB — CBC
HCT: 24.1 % — ABNORMAL LOW (ref 36.0–46.0)
Hemoglobin: 8.1 g/dL — ABNORMAL LOW (ref 12.0–15.0)
MCH: 29.1 pg (ref 26.0–34.0)
MCHC: 33.6 g/dL (ref 30.0–36.0)
MCV: 86.7 fL (ref 78.0–100.0)
Platelets: 114 10*3/uL — ABNORMAL LOW (ref 150–400)
RBC: 2.78 MIL/uL — ABNORMAL LOW (ref 3.87–5.11)
RDW: 15 % (ref 11.5–15.5)
WBC: 7.8 10*3/uL (ref 4.0–10.5)

## 2013-07-04 LAB — CULTURE, BLOOD (SINGLE)

## 2013-07-04 MED ORDER — ALBUMIN HUMAN 5 % IV SOLN
12.5000 g | Freq: Once | INTRAVENOUS | Status: AC
  Administered 2013-07-04: 12.5 g via INTRAVENOUS
  Filled 2013-07-04: qty 250

## 2013-07-04 MED ORDER — AMIODARONE HCL IN DEXTROSE 360-4.14 MG/200ML-% IV SOLN
30.0000 mg/h | INTRAVENOUS | Status: AC
  Administered 2013-07-04 (×2): 30 mg/h via INTRAVENOUS
  Filled 2013-07-04 (×5): qty 200

## 2013-07-04 MED ORDER — INSULIN ASPART 100 UNIT/ML ~~LOC~~ SOLN: 0.0000 [IU] | Freq: Every day | SUBCUTANEOUS | Status: DC

## 2013-07-04 MED ORDER — INSULIN ASPART 100 UNIT/ML ~~LOC~~ SOLN
4.0000 [IU] | Freq: Three times a day (TID) | SUBCUTANEOUS | Status: DC
  Administered 2013-07-04 – 2013-07-07 (×10): 4 [IU] via SUBCUTANEOUS

## 2013-07-04 MED ORDER — INSULIN ASPART 100 UNIT/ML ~~LOC~~ SOLN
0.0000 [IU] | Freq: Three times a day (TID) | SUBCUTANEOUS | Status: DC
  Administered 2013-07-04: 5 [IU] via SUBCUTANEOUS
  Administered 2013-07-05: 2 [IU] via SUBCUTANEOUS
  Administered 2013-07-05 – 2013-07-06 (×2): 3 [IU] via SUBCUTANEOUS

## 2013-07-04 NOTE — Progress Notes (Signed)
Patient was asleep while in her chair with pillow behind her head and tranducer sleeve IVs were laying on pillow as well. While patient was sleeping she felt her pillow flip down to right and the felt the tape rip off of her neck. Patient alerted nursing staff and when assessed, the tranducer was kinked and no longer operable. MD was rounding at the time and gave orders to remove. Will continue to monitor. Richardean Sale, RN

## 2013-07-04 NOTE — Progress Notes (Signed)
Up in chair  BP 89/46  Pulse 73  Temp(Src) 97.6 F (36.4 C) (Oral)  Resp 13  Ht 5\' 4"  (1.626 m)  Wt 174 lb 2.6 oz (79 kg)  BMI 29.88 kg/m2  SpO2 100%   Intake/Output Summary (Last 24 hours) at 07/04/13 1708 Last data filed at 07/04/13 1500  Gross per 24 hour  Intake 1092.11 ml  Output   1005 ml  Net  87.11 ml    Currently in SR @ 72 bpm, BP 111/59 on 2 mcg/min of neo  CBG elevated- add meal coverage

## 2013-07-04 NOTE — Progress Notes (Signed)
Upon first assessment patient rhythm displaying A-fib, irregular in the low 100's. Patient does not express any discomfort or palpitations, all other VSS. MD notified and new orders were received. Richardean Sale, RN

## 2013-07-04 NOTE — Op Note (Signed)
NAMEMarland Kitchen  JERELEAN, GLOSSON NO.:  [id number]  MEDICAL RECORD NO.:  [id number]  LOCATION:  2S01C                        FACILITY:  [facility]  PHYSICIAN:  Ivin [name], M.D.  DATE OF BIRTH:  [birth year]  DATE OF PROCEDURE:  07/02/2013 DATE OF DISCHARGE:                              OPERATIVE REPORT   OPERATION: 1. Coronary artery bypass graft x3 (left internal mammary artery to     LAD, saphenous vein graft to obtuse marginal, saphenous vein graft     to posterior descending). 2. Endoscopic harvest of right leg greater saphenous vein.  PREOPERATIVE DIAGNOSIS:  Unstable angina with severe 3-vessel coronary artery disease, status post Doppler myocardial imaging with reduced left ventricular ejection fraction, mild mitral regurgitation.  POSTOPERATIVE DIAGNOSIS:  Unstable angina with severe 3-vessel coronary artery disease, status post Doppler myocardial imaging with reduced left ventricular ejection fraction, mild mitral regurgitation.  SURGEON:  Ivin [name], M.D.  ASSISTANT:  Lars Pinks, PA-C.  ANESTHESIA:  General by Dr. Tedra Senegal.  INDICATIONS:  The patient is a 58 year old diabetic nonsmoker who presents with symptoms of chest discomfort and shortness of breath.  She was found to be in pulmonary edema by x-ray and had severe LV dysfunction by echocardiogram with mild-moderate mitral regurgitation on initial presentation.  She was diuresed and then underwent cardiac catheterization at [facility].  This demonstrated severe 3-vessel coronary disease with 95% stenosis of the LAD, OM, and distal RCA with occlusion of the PDA which filled via collaterals.  Her ejection fraction was approximately 20%-25%.  She was transferred to this hospital for surgical coronary revascularization.  After she arrived at this hospital, I examined the patient in her CCU room and reviewed results of the cardiac catheterization and echocardiogram with the  patient and family.  Her breathing was significantly improved after diuresis.  Her cardiac enzymes were minimally elevated.  Her blood sugars, however, were extremely high and poorly controlled.  She was prepared and evaluated for surgical coronary revascularization.  Her preoperative carotid duplex scan showed no significant disease.  I discussed the procedure of CABG with possible mitral valve repair with the patient including the use of general anesthesia and cardiopulmonary bypass, the location of the surgical incisions, and the expected postoperative recovery.  I reviewed with the patient the risks to her of this operation including risks of MI, stroke, bleeding, blood transfusion requirement, infection, pleural effusion, cardiac arrhythmias, and death.  After reviewing these issues, she demonstrated her understanding and agreed to proceed with surgery under what I felt was an informed consent.  OPERATIVE FINDINGS: 1. Transesophageal echo showed only mild mitral regurgitation. 2. Intraoperative anemia with a starting hemoglobin 8.2, required a     unit of packed cell transfusion. 3. Adequate targets and adequate conduit, although the posterior     descending was small.  There were no posterolateral branches which     were large enough to graft.  OPERATIVE PROCEDURE:  The patient was brought to the operating room and placed supine on the operating table.  General anesthesia was induced under invasive hemodynamic monitoring.  The chest, abdomen, and legs were prepped with Betadine and draped as a sterile  field.  A transesophageal echo probe was placed by the anesthesiologist.  A proper time-out was performed.  The right leg saphenous vein was harvested endoscopically as a sternal incision was made.  The left internal mammary artery was harvested as a pedicle graft from its origin at the subclavian vessels and it was a good vessel with excellent flow. The sternal retractor was  placed and the pericardium was opened and suspended.  There was moderate amount of mediastinal fat. After the vein was harvested and prepared, the patient was fully heparinized and the ACT was documented as being therapeutic.  Pursestrings were placed in the ascending aorta and right atrium. and the patient was then cannulated and placed on cardiopulmonary bypass.  Cardioplegia catheters placed in both antegrade and retrograde cold blood cardioplegia, and the patient was cooled to 32 degrees.  The aortic crossclamp was applied.  One liter of cold blood cardioplegia was delivered in split doses between the antegrade aortic and retrograde coronary sinus catheters.  There was good cardioplegic arrest and septal temperature dropped less than 12 degrees.  Cardioplegia was delivered every 20 minutes or less while the crossclamp was in place.  The distal coronary anastomoses were then performed.  The first distal anastomosis was to the posterior descending branch of the right coronary.  This had been totally occluded from its previous MI.  A reverse saphenous vein was sewn end-to-side to this 1.4-mm vessel with good flow.  Cardioplegia was redosed.  The second distal anastomosis was the OM branch from left coronary. This had a proximal 95% stenosis.  A reverse saphenous vein was sewn end- to-side with running 7-0 Prolene with good flow through graft. Cardioplegia was redosed.  The third distal anastomosis was to the mid LAD.Marland Kitchen  There was a proximal 95% stenosis.  The left IMA pedicle was brought through an opening in the left lateral pericardium, was brought down onto the LAD and sewn end- to-side with running 8-0 Prolene.  There was good flow through the anastomosis after briefly releasing the pedicle bulldog on the mammary artery.  The bulldog was reapplied and pedicle secured epicardium with 6- 0 Prolene.  Cardioplegia was redosed.  While the crossclamp was still in place, 2 proximal  vein anastomoses were performed on the ascending aorta using a 4.5 mm punch running 6-0 Prolene.  Prior to tying down the final proximal anastomosis, air was vented from the coronaries with a dose of retrograde warm blood cardioplegia.  The crossclamp was removed.  The heart resumed a spontaneous rhythm.  The vein grafts were de-aired and opened and each had good flow and hemostasis was documented at the proximal distal anastomoses.  The patient was rewarmed and reperfused. Temporary pacing wires were applied.  The lungs were expanded and ventilator was resumed.  The patient was weaned off cardiopulmonary bypass with low-dose dopamine and milrinone.  The echo showed minimal- mild mitral regurgitation.  Overall global LV function was improved.  Protamine was administered without adverse reaction.  The cannulas were removed.  The mediastinum was closed over the aorta and vein grafts. Hemostasis was achieved.  The sternum was closed with interrupted steel wire.  The pectoralis fascia and subcutaneous layers were closed in running Vicryl.  The skin was closed with a subcuticular suture.  The patient returned to the ICU in stable condition.  Total cardiopulmonary bypass time was 100 minutes.     Ivin [name], M.D.     PV/MEDQ  D:  07/03/2013  T:  07/04/2013  Job:  [id number]  cc:   Kathlyn Sacramento, MD

## 2013-07-04 NOTE — Progress Notes (Signed)
2 Days Post-Op Procedure(s) (LRB): CORONARY ARTERY BYPASS GRAFTING (CABG) (N/A) INTRAOPERATIVE TRANSESOPHAGEAL ECHOCARDIOGRAM (N/A) Subjective: C/o feeling dizzy when she got up to try to walk Also c/o nausea  Objective: Vital signs in last 24 hours: Temp:  [97.5 F (36.4 C)-98.3 F (36.8 C)] 97.5 F (36.4 C) (05/02 0346) Pulse Rate:  [75-96] 76 (05/02 0700) Cardiac Rhythm:  [-] Normal sinus rhythm (05/02 0600) Resp:  [8-28] 13 (05/02 0700) BP: (73-125)/(42-66) 112/55 mmHg (05/02 0600) SpO2:  [88 %-100 %] 90 % (05/02 0700) Arterial Line BP: (104-155)/(46-61) 140/53 mmHg (05/01 1130) Weight:  [174 lb 2.6 oz (79 kg)] 174 lb 2.6 oz (79 kg) (05/02 0500)  Hemodynamic parameters for last 24 hours: PAP: (17-30)/(6-14) 30/14 mmHg CO:  [4.8 L/min-5.9 L/min] 5.9 L/min CI:  [2.7 L/min/m2-3.3 L/min/m2] 3.3 L/min/m2  Intake/Output from previous day: 05/01 0701 - 05/02 0700 In: 1747.2 [P.O.:960; I.V.:687.2; IV Piggyback:100] Out: 1340 [Urine:1300; Chest Tube:40] Intake/Output this shift:    General appearance: alert and no distress Neurologic: intact Heart: regular rate and rhythm Lungs: diminished breath sounds bibasilar Abdomen: normal findings: soft, non-tender  Lab Results:  Recent Labs  07/03/13 1750 07/03/13 1754 07/04/13 0415  WBC 9.5  --  7.8  HGB 9.0* 8.8* 8.1*  HCT 26.1* 26.0* 24.1*  PLT 123*  --  114*   BMET:  Recent Labs  07/03/13 0400  07/03/13 1754 07/04/13 0415  NA 140  --  137 140  K 4.4  --  4.5 4.6  CL 103  --  100 105  CO2 23  --   --  25  GLUCOSE 114*  --  125* 138*  BUN 31*  --  32* 41*  CREATININE 1.18*  < > 1.40* 1.44*  CALCIUM 8.0*  --   --  8.4  < > = values in this interval not displayed.  PT/INR:  Recent Labs  07/02/13 1300  LABPROT 17.5*  INR 1.48   ABG    Component Value Date/Time   PHART 7.371 07/02/2013 1808   HCO3 25.4* 07/02/2013 1903   TCO2 24 07/03/2013 1754   O2SAT 95.0 07/02/2013 1903   CBG (last 3)   Recent Labs  07/03/13 1947 07/03/13 2328 07/04/13 0344  GLUCAP 208* 180* 124*    Assessment/Plan: S/P Procedure(s) (LRB): CORONARY ARTERY BYPASS GRAFTING (CABG) (N/A) INTRAOPERATIVE TRANSESOPHAGEAL ECHOCARDIOGRAM (N/A) POD # 2 CABG CV- in SR, BP is relatively low and symptomatic orthostatic hypotension  Give albumin this AM  Hold off on further diuresis for now  RESP- continue IS  RENAL- creatinine still mildly elevated  ENDO- CBG elevated yesterday PM, better this AM- continue lantus/ SSI  Anemia secondary to ABL- follow    LOS: 5 days    Melrose Nakayama 07/04/2013

## 2013-07-05 ENCOUNTER — Inpatient Hospital Stay (HOSPITAL_COMMUNITY): Payer: No Typology Code available for payment source

## 2013-07-05 LAB — CBC
HCT: 25.4 % — ABNORMAL LOW (ref 36.0–46.0)
Hemoglobin: 8.5 g/dL — ABNORMAL LOW (ref 12.0–15.0)
MCH: 29.3 pg (ref 26.0–34.0)
MCHC: 33.5 g/dL (ref 30.0–36.0)
MCV: 87.6 fL (ref 78.0–100.0)
Platelets: 144 10*3/uL — ABNORMAL LOW (ref 150–400)
RBC: 2.9 MIL/uL — ABNORMAL LOW (ref 3.87–5.11)
RDW: 15.2 % (ref 11.5–15.5)
WBC: 9.6 10*3/uL (ref 4.0–10.5)

## 2013-07-05 LAB — GLUCOSE, CAPILLARY
Glucose-Capillary: 106 mg/dL — ABNORMAL HIGH (ref 70–99)
Glucose-Capillary: 194 mg/dL — ABNORMAL HIGH (ref 70–99)
Glucose-Capillary: 129 mg/dL — ABNORMAL HIGH (ref 70–99)
Glucose-Capillary: 81 mg/dL (ref 70–99)

## 2013-07-05 LAB — BASIC METABOLIC PANEL
BUN: 52 mg/dL — ABNORMAL HIGH (ref 6–23)
BUN: 51 mg/dL — ABNORMAL HIGH (ref 6–23)
CO2: 26 mEq/L (ref 19–32)
CO2: 24 mEq/L (ref 19–32)
Calcium: 8.7 mg/dL (ref 8.4–10.5)
Calcium: 8.8 mg/dL (ref 8.4–10.5)
Chloride: 99 mEq/L (ref 96–112)
Chloride: 96 mEq/L (ref 96–112)
Creatinine, Ser: 1.86 mg/dL — ABNORMAL HIGH (ref 0.50–1.10)
Creatinine, Ser: 1.79 mg/dL — ABNORMAL HIGH (ref 0.50–1.10)
GFR calc Af Amer: 33 mL/min — ABNORMAL LOW (ref >90)
GFR calc Af Amer: 35 mL/min — ABNORMAL LOW (ref >90)
GFR calc non Af Amer: 29 mL/min — ABNORMAL LOW (ref >90)
GFR calc non Af Amer: 30 mL/min — ABNORMAL LOW (ref >90)
Glucose, Bld: 129 mg/dL — ABNORMAL HIGH (ref 70–99)
Glucose, Bld: 110 mg/dL — ABNORMAL HIGH (ref 70–99)
Potassium: 5.5 mEq/L — ABNORMAL HIGH (ref 3.7–5.3)
Potassium: 4.6 mEq/L (ref 3.7–5.3)
Sodium: 135 mEq/L — ABNORMAL LOW (ref 137–147)
Sodium: 135 mEq/L — ABNORMAL LOW (ref 137–147)

## 2013-07-05 MED ORDER — AMIODARONE HCL 200 MG PO TABS
400.0000 mg | ORAL_TABLET | Freq: Two times a day (BID) | ORAL | Status: DC
  Administered 2013-07-05 (×2): 400 mg via ORAL
  Filled 2013-07-05 (×4): qty 2

## 2013-07-05 NOTE — Progress Notes (Signed)
Comfortable, BP better  BP 100/57  Pulse 70  Temp(Src) 97.4 F (36.3 C) (Oral)  Resp 23  Ht 5\' 4"  (1.626 m)  Wt 180 lb 11.2 oz (81.965 kg)  BMI 31.00 kg/m2  SpO2 98%   Intake/Output Summary (Last 24 hours) at 07/05/13 1909 Last data filed at 07/05/13 1900  Gross per 24 hour  Intake 1123.9 ml  Output    830 ml  Net  293.9 ml    Cr= 1.79 down from 1.86, lytes OK  Down to 5 mcg/min on neo

## 2013-07-05 NOTE — Progress Notes (Signed)
3 Days Post-Op Procedure(s) (LRB): CORONARY ARTERY BYPASS GRAFTING (CABG) (N/A) INTRAOPERATIVE TRANSESOPHAGEAL ECHOCARDIOGRAM (N/A) Subjective: Some incisional pain, but overall feels well this AM  Objective: Vital signs in last 24 hours: Temp:  [97.2 F (36.2 C)-97.8 F (36.6 C)] 97.4 F (36.3 C) (05/03 0403) Pulse Rate:  [63-112] 68 (05/03 0700) Cardiac Rhythm:  [-] Normal sinus rhythm (05/03 0600) Resp:  [10-24] 14 (05/03 0700) BP: (71-140)/(41-81) 123/64 mmHg (05/03 0700) SpO2:  [88 %-100 %] 97 % (05/03 0700) Weight:  [180 lb 11.2 oz (81.965 kg)] 180 lb 11.2 oz (81.965 kg) (05/03 0500)  Hemodynamic parameters for last 24 hours:    Intake/Output from previous day: 05/02 0701 - 05/03 0700 In: 929.3 [P.O.:340; I.V.:589.3] Out: 675 [Urine:675] Intake/Output this shift:    General appearance: alert and no distress Neurologic: intact Heart: regular rate and rhythm Lungs: diminished breath sounds bibasilar Abdomen: normal findings: soft, non-tender  Lab Results:  Recent Labs  07/04/13 0415 07/05/13 0310  WBC 7.8 9.6  HGB 8.1* 8.5*  HCT 24.1* 25.4*  PLT 114* 144*   BMET:  Recent Labs  07/04/13 0415 07/05/13 0310  NA 140 135*  K 4.6 5.5*  CL 105 99  CO2 25 26  GLUCOSE 138* 129*  BUN 41* 52*  CREATININE 1.44* 1.86*  CALCIUM 8.4 8.7    PT/INR:  Recent Labs  07/02/13 1300  LABPROT 17.5*  INR 1.48   ABG    Component Value Date/Time   PHART 7.371 07/02/2013 1808   HCO3 25.4* 07/02/2013 1903   TCO2 24 07/03/2013 1754   O2SAT 95.0 07/02/2013 1903   CBG (last 3)   Recent Labs  07/04/13 1237 07/04/13 1606 07/04/13 2213  GLUCAP 207* 222* 132*    Assessment/Plan: S/P Procedure(s) (LRB): CORONARY ARTERY BYPASS GRAFTING (CABG) (N/A) INTRAOPERATIVE TRANSESOPHAGEAL ECHOCARDIOGRAM (N/A) POD # 3 CABG CV- a fib yesterday- in SR on amiodarone gtt- will transition to PO  Primary issue remains BP- has been hypotensive requiring neo drip- wean as  tolerated  RESP- CXR shows LLL atelectasis- IS  RENAL- creatinine elevated this AM- c/w acute kidney injury due to low BP  Keep foley, monitor  ENDO- CBG elevated yesterday- better this AM  Continue lantus, meal coverage and SSI  On synthroid for hypothyroidism   LOS: 6 days    Melrose Nakayama 07/05/2013

## 2013-07-06 ENCOUNTER — Inpatient Hospital Stay (HOSPITAL_COMMUNITY): Payer: No Typology Code available for payment source

## 2013-07-06 DIAGNOSIS — I369 Nonrheumatic tricuspid valve disorder, unspecified: Secondary | ICD-10-CM

## 2013-07-06 LAB — BASIC METABOLIC PANEL
BUN: 55 mg/dL — ABNORMAL HIGH (ref 6–23)
CO2: 26 mEq/L (ref 19–32)
Calcium: 8.3 mg/dL — ABNORMAL LOW (ref 8.4–10.5)
Chloride: 99 mEq/L (ref 96–112)
Creatinine, Ser: 1.76 mg/dL — ABNORMAL HIGH (ref 0.50–1.10)
GFR calc Af Amer: 35 mL/min — ABNORMAL LOW (ref >90)
GFR calc non Af Amer: 31 mL/min — ABNORMAL LOW (ref >90)
Glucose, Bld: 93 mg/dL (ref 70–99)
Potassium: 4.9 mEq/L (ref 3.7–5.3)
Sodium: 134 mEq/L — ABNORMAL LOW (ref 137–147)

## 2013-07-06 LAB — CBC
HCT: 23.9 % — ABNORMAL LOW (ref 36.0–46.0)
Hemoglobin: 8 g/dL — ABNORMAL LOW (ref 12.0–15.0)
MCH: 29 pg (ref 26.0–34.0)
MCHC: 33.5 g/dL (ref 30.0–36.0)
MCV: 86.6 fL (ref 78.0–100.0)
Platelets: 153 10*3/uL (ref 150–400)
RBC: 2.76 MIL/uL — ABNORMAL LOW (ref 3.87–5.11)
RDW: 14.8 % (ref 11.5–15.5)
WBC: 8.1 10*3/uL (ref 4.0–10.5)

## 2013-07-06 LAB — GLUCOSE, CAPILLARY
Glucose-Capillary: 96 mg/dL (ref 70–99)
Glucose-Capillary: 177 mg/dL — ABNORMAL HIGH (ref 70–99)
Glucose-Capillary: 114 mg/dL — ABNORMAL HIGH (ref 70–99)

## 2013-07-06 LAB — PREPARE RBC (CROSSMATCH)

## 2013-07-06 MED ORDER — AMIODARONE HCL IN DEXTROSE 360-4.14 MG/200ML-% IV SOLN
INTRAVENOUS | Status: AC
  Filled 2013-07-06: qty 200

## 2013-07-06 MED ORDER — FE FUMARATE-B12-VIT C-FA-IFC PO CAPS
1.0000 | ORAL_CAPSULE | Freq: Two times a day (BID) | ORAL | Status: DC
  Administered 2013-07-06 – 2013-07-14 (×16): 1 via ORAL
  Filled 2013-07-06 (×18): qty 1

## 2013-07-06 MED ORDER — FUROSEMIDE 10 MG/ML IJ SOLN: 20.0000 mg | Freq: Once | INTRAMUSCULAR | Status: DC

## 2013-07-06 MED ORDER — ENOXAPARIN SODIUM 30 MG/0.3ML ~~LOC~~ SOLN
30.0000 mg | SUBCUTANEOUS | Status: DC
  Administered 2013-07-06: 30 mg via SUBCUTANEOUS
  Filled 2013-07-06 (×2): qty 0.3

## 2013-07-06 MED ORDER — AMIODARONE HCL IN DEXTROSE 360-4.14 MG/200ML-% IV SOLN: 30.0000 mg/h | INTRAVENOUS | Status: DC

## 2013-07-06 MED ORDER — AMIODARONE HCL 200 MG PO TABS
200.0000 mg | ORAL_TABLET | Freq: Once | ORAL | Status: AC
  Administered 2013-07-06: 200 mg via ORAL
  Filled 2013-07-06: qty 1

## 2013-07-06 MED ORDER — SODIUM CHLORIDE 0.9 % IJ SOLN
10.0000 mL | Freq: Two times a day (BID) | INTRAMUSCULAR | Status: DC
  Administered 2013-07-06 – 2013-07-09 (×5): 10 mL
  Administered 2013-07-13: 20 mL

## 2013-07-06 MED ORDER — SODIUM CHLORIDE 0.9 % IJ SOLN
10.0000 mL | INTRAMUSCULAR | Status: DC | PRN
  Administered 2013-07-07 – 2013-07-09 (×3): 10 mL
  Administered 2013-07-09: 30 mL
  Administered 2013-07-10 – 2013-07-11 (×2): 20 mL
  Administered 2013-07-12: 10 mL
  Administered 2013-07-12: 20 mL
  Administered 2013-07-13 (×2): 10 mL
  Administered 2013-07-13: 20 mL

## 2013-07-06 MED ORDER — FUROSEMIDE 10 MG/ML IJ SOLN
INTRAMUSCULAR | Status: AC
  Administered 2013-07-06: 20 mg
  Filled 2013-07-06: qty 2

## 2013-07-06 MED ORDER — AMIODARONE HCL 200 MG PO TABS
200.0000 mg | ORAL_TABLET | Freq: Three times a day (TID) | ORAL | Status: DC
  Filled 2013-07-06 (×5): qty 1

## 2013-07-06 MED ORDER — AMIODARONE HCL IN DEXTROSE 360-4.14 MG/200ML-% IV SOLN: 30.0000 mg/h | INTRAVENOUS | Status: AC

## 2013-07-06 MED ORDER — LACTULOSE 10 GM/15ML PO SOLN
20.0000 g | Freq: Every day | ORAL | Status: DC | PRN
  Administered 2013-07-06: 20 g via ORAL
  Filled 2013-07-06: qty 30

## 2013-07-06 MED ORDER — FUROSEMIDE 10 MG/ML IJ SOLN
40.0000 mg | Freq: Once | INTRAMUSCULAR | Status: AC
  Administered 2013-07-06: 40 mg via INTRAVENOUS
  Filled 2013-07-06: qty 4

## 2013-07-06 MED ORDER — AMIODARONE HCL 200 MG PO TABS
200.0000 mg | ORAL_TABLET | Freq: Two times a day (BID) | ORAL | Status: DC
  Administered 2013-07-06: 200 mg via ORAL
  Filled 2013-07-06 (×2): qty 1

## 2013-07-06 MED FILL — Magnesium Sulfate Inj 50%: INTRAMUSCULAR | Qty: 10 | Status: AC

## 2013-07-06 MED FILL — Heparin Sodium (Porcine) Inj 1000 Unit/ML: INTRAMUSCULAR | Qty: 30 | Status: AC

## 2013-07-06 MED FILL — Potassium Chloride Inj 2 mEq/ML: INTRAVENOUS | Qty: 40 | Status: AC

## 2013-07-06 NOTE — Progress Notes (Signed)
[name], female   DOB: [birth year], 58 y.o.   MRN: [id number]  SICU Evening Rounds:  BP remains marginal 89/49 P 77 sinus  Transfused today  Urine output ok  2D echo tonight shows good LV function, trivial MR and TR, no AI, no significant pericardial effusion, some left pleural effusion.   She is doing fairly well but has borderline BP. Hold off on lopressor and observe. Hold further diuresis until BP rises.

## 2013-07-06 NOTE — Progress Notes (Signed)
Peripherally Inserted Central Catheter/Midline Placement  The IV Nurse has discussed with the patient and/or persons authorized to consent for the patient, the purpose of this procedure and the potential benefits and risks involved with this procedure.  The benefits include less needle sticks, lab draws from the catheter and patient may be discharged home with the catheter.  Risks include, but not limited to, infection, bleeding, blood clot (thrombus formation), and puncture of an artery; nerve damage and irregular heat beat.  Alternatives to this procedure were also discussed.  PICC/Midline Placement Documentation  PICC / Midline Double Lumen [id number] PICC Right Basilic 42 cm 3 cm (Active)  Indication for Insertion or Continuance of Line Limited venous access - need for IV therapy >5 days (PICC only) 07/06/2013  9:46 PM  Exposed Catheter (cm) 3 cm 07/06/2013  9:46 PM  Site Assessment Clean;Dry;Intact 07/06/2013  9:46 PM  Dressing Change Due 07/13/13 07/06/2013  9:46 PM       Mordecai Rasmussen Poff 07/06/2013, 9:48 PM

## 2013-07-06 NOTE — Progress Notes (Signed)
Echocardiogram 2D Echocardiogram has been performed.  [place] 07/06/2013, 6:35 PM

## 2013-07-06 NOTE — Progress Notes (Signed)
Hypoglycemic Event  CBG: 49  Treatment: 15 grams CHO  Symptoms: dizzy, tired  Follow-up CBG: Time: 2230 CBG Result: 63 then 91 at 2255  Possible Reasons for Event: unknown     Jamie Arias  Remember to initiate Hypoglycemia Order Set & complete

## 2013-07-06 NOTE — Progress Notes (Addendum)
      [address]       [place] [address]             [phone]      4 Days Post-Op Procedure(s) (LRB): CORONARY ARTERY BYPASS GRAFTING (CABG) (N/A) INTRAOPERATIVE TRANSESOPHAGEAL ECHOCARDIOGRAM (N/A)  Subjective:  Ms. Shavers is doing okay this morning.  She does have some dizziness with ambulation.   Objective: Vital signs in last 24 hours: Temp:  [97.4 F (36.3 C)-99.4 F (37.4 C)] 98 F (36.7 C) (05/04 0327) Pulse Rate:  [64-88] 82 (05/04 0700) Cardiac Rhythm:  [-] Normal sinus rhythm (05/04 0400) Resp:  [11-23] 16 (05/04 0700) BP: (82-142)/(22-80) 138/67 mmHg (05/04 0700) SpO2:  [91 %-100 %] 98 % (05/04 0700) Weight:  [184 lb 1.4 oz (83.5 kg)] 184 lb 1.4 oz (83.5 kg) (05/04 0500)  Intake/Output from previous day: 05/03 0701 - 05/04 0700 In: 1253.5 [P.O.:1080; I.V.:173.5] Out: 1105 [Urine:1105]  General appearance: alert, cooperative and no distress Heart: regular rate and rhythm Lungs: diminished breath sounds left base Abdomen: soft, non-tender; bowel sounds normal; no masses,  no organomegaly Extremities: edema trace Wound: clean and dry  Lab Results:  Recent Labs  07/05/13 0310 07/06/13 0315  WBC 9.6 8.1  HGB 8.5* 8.0*  HCT 25.4* 23.9*  PLT 144* 153   BMET:  Recent Labs  07/05/13 1740 07/06/13 0315  NA 135* 134*  K 4.6 4.9  CL 96 99  CO2 24 26  GLUCOSE 110* 93  BUN 51* 55*  CREATININE 1.79* 1.76*  CALCIUM 8.8 8.3*    PT/INR: No results found for this basename: LABPROT, INR,  in the last 72 hours ABG    Component Value Date/Time   PHART 7.371 07/02/2013 1808   HCO3 25.4* 07/02/2013 1903   TCO2 24 07/03/2013 1754   O2SAT 95.0 07/02/2013 1903   CBG (last 3)   Recent Labs  07/05/13 1122 07/05/13 1705 07/05/13 2147  GLUCAP 194* 129* 81    Assessment/Plan: S/P Procedure(s) (LRB): CORONARY ARTERY BYPASS GRAFTING (CABG) (N/A) INTRAOPERATIVE TRANSESOPHAGEAL ECHOCARDIOGRAM (N/A)  1. CV- previous A. Fib, currently  NSR- on Amiodarone, Lopressor, remains on Neo for hypotension which we will wean as tolerated 2. Pulm- wean oxygen as tolerated, some atelectasis vs. Small effusion 3. Renal- creatinine is mildly elevated at 1.76 which is stable, minimal volume overload on exam, however documented weight is 20 lbs above baseline, which I feel is likely inaccurate 4. Expected Blood Loss Anemia- Hgb 8.0, may benefit from transfusion with persistent need for NEO 5. DM- preop A1c 7.2, sugars well controlled, continue current regimen 6. LOC constipation- Lactulose prn 7. Dispo- patient remains on Neo for hypotension, may benefit from transfusion with Hgb of 8.0, continue current care   LOS: 7 days    Erin Barrett 07/06/2013  Transfuse 1 unit packed cells for postop anemia and persistent phenylephrine requirement Reduce amio to 200 mb bid

## 2013-07-07 ENCOUNTER — Inpatient Hospital Stay (HOSPITAL_COMMUNITY): Payer: No Typology Code available for payment source

## 2013-07-07 LAB — TYPE AND SCREEN
ABO/RH(D): A POS
Antibody Screen: NEGATIVE
Unit division: 0

## 2013-07-07 LAB — CBC
HCT: 27.1 % — ABNORMAL LOW (ref 36.0–46.0)
Hemoglobin: 9.2 g/dL — ABNORMAL LOW (ref 12.0–15.0)
MCH: 29 pg (ref 26.0–34.0)
MCHC: 33.9 g/dL (ref 30.0–36.0)
MCV: 85.5 fL (ref 78.0–100.0)
Platelets: 168 10*3/uL (ref 150–400)
RBC: 3.17 MIL/uL — ABNORMAL LOW (ref 3.87–5.11)
RDW: 14.9 % (ref 11.5–15.5)
WBC: 11.5 10*3/uL — ABNORMAL HIGH (ref 4.0–10.5)

## 2013-07-07 LAB — BASIC METABOLIC PANEL
BUN: 57 mg/dL — ABNORMAL HIGH (ref 6–23)
CO2: 26 mEq/L (ref 19–32)
Calcium: 8.5 mg/dL (ref 8.4–10.5)
Chloride: 93 mEq/L — ABNORMAL LOW (ref 96–112)
Creatinine, Ser: 1.63 mg/dL — ABNORMAL HIGH (ref 0.50–1.10)
GFR calc Af Amer: 39 mL/min — ABNORMAL LOW (ref >90)
GFR calc non Af Amer: 33 mL/min — ABNORMAL LOW (ref >90)
Glucose, Bld: 171 mg/dL — ABNORMAL HIGH (ref 70–99)
Potassium: 5.4 mEq/L — ABNORMAL HIGH (ref 3.7–5.3)
Sodium: 128 mEq/L — ABNORMAL LOW (ref 137–147)

## 2013-07-07 LAB — GLUCOSE, CAPILLARY
Glucose-Capillary: 148 mg/dL — ABNORMAL HIGH (ref 70–99)
Glucose-Capillary: 147 mg/dL — ABNORMAL HIGH (ref 70–99)
Glucose-Capillary: 142 mg/dL — ABNORMAL HIGH (ref 70–99)
Glucose-Capillary: 122 mg/dL — ABNORMAL HIGH (ref 70–99)

## 2013-07-07 MED ORDER — INSULIN DETEMIR 100 UNIT/ML ~~LOC~~ SOLN
12.0000 [IU] | Freq: Two times a day (BID) | SUBCUTANEOUS | Status: DC
  Filled 2013-07-07: qty 0.12

## 2013-07-07 MED ORDER — INSULIN DETEMIR 100 UNIT/ML ~~LOC~~ SOLN
8.0000 [IU] | Freq: Two times a day (BID) | SUBCUTANEOUS | Status: DC
  Administered 2013-07-07 – 2013-07-08 (×3): 8 [IU] via SUBCUTANEOUS
  Filled 2013-07-07 (×4): qty 0.08

## 2013-07-07 MED ORDER — FUROSEMIDE 10 MG/ML IJ SOLN
80.0000 mg | Freq: Once | INTRAMUSCULAR | Status: AC
  Administered 2013-07-07: 80 mg via INTRAVENOUS
  Filled 2013-07-07: qty 8

## 2013-07-07 MED ORDER — DIGOXIN 125 MCG PO TABS
0.1250 mg | ORAL_TABLET | Freq: Every day | ORAL | Status: DC
  Administered 2013-07-07 – 2013-07-08 (×2): 0.125 mg via ORAL
  Filled 2013-07-07 (×3): qty 1

## 2013-07-07 MED ORDER — METOCLOPRAMIDE HCL 5 MG/ML IJ SOLN
10.0000 mg | Freq: Four times a day (QID) | INTRAMUSCULAR | Status: AC
  Administered 2013-07-07 – 2013-07-08 (×4): 10 mg via INTRAVENOUS
  Filled 2013-07-07 (×4): qty 2

## 2013-07-07 NOTE — Plan of Care (Signed)
Problem: Phase I - Pre-Op Goal: Point person for discharge identified Outcome: Completed/Met Date Met:  07/07/13 Home with husband

## 2013-07-07 NOTE — Progress Notes (Signed)
Pt reports feeling lightheaded, dizzy, and nauseated. She feels like she could pass out if she stands. BP 126/63, HR 77 SR, O2 sat 94% on 2 L. Pt received Zofran earlier. 2 view CXR changed to portable CXR. Will continue to monitor.  Vella Raring, RN

## 2013-07-07 NOTE — Progress Notes (Addendum)
      [address]       [place] [address]             [phone]      5 Days Post-Op Procedure(s) (LRB): CORONARY ARTERY BYPASS GRAFTING (CABG) (N/A) INTRAOPERATIVE TRANSESOPHAGEAL ECHOCARDIOGRAM (N/A)  Subjective:  Jamie Arias complains of dizziness, lightheadness, feeling faint, and nausea.  She also states last night she was experiencing hot flashes.  She has not ambulated much due to her symptoms.  No BM  Objective: Vital signs in last 24 hours: Temp:  [97.5 F (36.4 C)-98.1 F (36.7 C)] 97.8 F (36.6 C) (05/05 0716) Pulse Rate:  [44-125] 76 (05/05 0700) Cardiac Rhythm:  [-] Normal sinus rhythm (05/05 0600) Resp:  [12-27] 20 (05/05 0700) BP: (85-145)/(43-113) 116/84 mmHg (05/05 0700) SpO2:  [88 %-100 %] 95 % (05/05 0700) Weight:  [188 lb 7.9 oz (85.5 kg)] 188 lb 7.9 oz (85.5 kg) (05/05 0439)  Intake/Output from previous day: 05/04 0701 - 05/05 0700 In: 1402.5 [P.O.:1080; I.V.:10; Blood:312.5] Out: 1100 [Urine:1100]  General appearance: alert, cooperative and no distress Heart: regular rate and rhythm Lungs: clear to auscultation bilaterally Abdomen: soft, non-tender; bowel sounds normal; no masses,  no organomegaly Extremities: edema 1+ pitting RLE Wound: clean and dry  Lab Results:  Recent Labs  07/06/13 0315 07/07/13 0420  WBC 8.1 11.5*  HGB 8.0* 9.2*  HCT 23.9* 27.1*  PLT 153 168   BMET:  Recent Labs  07/06/13 0315 07/07/13 0420  NA 134* 128*  K 4.9 5.4*  CL 99 93*  CO2 26 26  GLUCOSE 93 171*  BUN 55* 57*  CREATININE 1.76* 1.63*  CALCIUM 8.3* 8.5    PT/INR: No results found for this basename: LABPROT, INR,  in the last 72 hours ABG    Component Value Date/Time   PHART 7.371 07/02/2013 1808   HCO3 25.4* 07/02/2013 1903   TCO2 24 07/03/2013 1754   O2SAT 95.0 07/02/2013 1903   CBG (last 3)   Recent Labs  07/06/13 0822 07/06/13 1229 07/06/13 1656  GLUCAP 96 177* 114*    Assessment/Plan: S/P Procedure(s)  (LRB): CORONARY ARTERY BYPASS GRAFTING (CABG) (N/A) INTRAOPERATIVE TRANSESOPHAGEAL ECHOCARDIOGRAM (N/A)  1. CV- NSR, hypotension improved after blood yesterday- patient on Amiodarone, however she is experiencing persistent nausea and dizziness both of which are side effects of this medication.  She may feel better if we discontinue the Amiodarone 2. Pulm- continued atelectasis, some pleural effusion on left, wean oxygen as tolerated, will order flutter valve 3. Renal- creatinine remains elevated, but is trending down, pitting edema RLE, IV Lasix ordered 4. Expected Blood Loss Anemia- Hgb improved to 9.2 after administration of packed cells, on Foltran 5. GI- constipation- will order Reglan scheduled for 4 doses, order suppository 6. DM- Hypoglycemia overnight, will decrease Levemir to 8 U BID 7. Dispo- patient with continued dizziness, nausea will stop Amiodarone, start Digoxin, reglan/suppository for constipation, adjusted insulin to hopefully improve hypoglycemia, ambulate, continue current care   LOS: 8 days    Jamie Arias 07/07/2013   patient examined and medical record reviewed,agree with above note. Jamie Arias 07/08/2013

## 2013-07-07 NOTE — Procedures (Signed)
Successful US guided left thoracentesis. Yielded 950 ml of blood tinged fluid. Pt tolerated procedure well. No immediate complications.  Specimen was not sent for labs. CXR ordered.  Hedy Jacob PA-C 07/07/2013 12:28 PM

## 2013-07-07 NOTE — Progress Notes (Signed)
[name], female   DOB: 1955/05/25, 58 y.o.   MRN: [id number] EVENING ROUNDS NOTE :     [address]       [place] [address]             [phone]                 5 Days Post-Op Procedure(s) (LRB): CORONARY ARTERY BYPASS GRAFTING (CABG) (N/A) INTRAOPERATIVE TRANSESOPHAGEAL ECHOCARDIOGRAM (N/A)  Total Length of Stay:  LOS: 8 days  BP 109/59  Pulse 80  Temp(Src) 98.2 F (36.8 C) (Oral)  Resp 18  Ht 5\' 4"  (1.626 m)  Wt 188 lb 7.9 oz (85.5 kg)  BMI 32.34 kg/m2  SpO2 97%  .Intake/Output     05/04 0701 - 05/05 0700 05/05 0701 - 05/06 0700   P.O. 1080 720   I.V. (mL/kg) 10 (0.1)    Blood 312.5    Total Intake(mL/kg) 1402.5 (16.4) 720 (8.4)   Urine (mL/kg/hr) 1100 (0.5) 750 (0.7)   Other  950 (0.9)   Total Output 1100 1700   Net +302.5 -980          . sodium chloride Stopped (07/03/13 1200)  . sodium chloride 10 mL/hr at 07/05/13 2157  . sodium chloride    . lactated ringers Stopped (07/04/13 2000)     Lab Results  Component Value Date   WBC 11.5* 07/07/2013   HGB 9.2* 07/07/2013   HCT 27.1* 07/07/2013   PLT 168 07/07/2013   GLUCOSE 171* 07/07/2013   CHOL 220* 06/30/2013   TRIG 87 06/30/2013   HDL 80 06/30/2013   LDLDIRECT 126.9 10/31/2012   LDLCALC 123* 06/30/2013   ALT 14 07/01/2013   AST 25 07/01/2013   NA 128* 07/07/2013   K 5.4* 07/07/2013   CL 93* 07/07/2013   CREATININE 1.63* 07/07/2013   BUN 57* 07/07/2013   CO2 26 07/07/2013   TSH 4.380 06/29/2013   INR 1.48 07/02/2013   HGBA1C 6.9* 06/29/2013   MICROALBUR 41.58* 07/25/2012   Stable day, feels better today Foley still in cr 1.63  Grace Isaac MD  Beeper [phone] Office [phone] 07/07/2013 6:51 PM

## 2013-07-08 ENCOUNTER — Inpatient Hospital Stay (HOSPITAL_COMMUNITY): Payer: No Typology Code available for payment source

## 2013-07-08 LAB — BASIC METABOLIC PANEL WITH GFR
BUN: 60 mg/dL — ABNORMAL HIGH (ref 6–23)
CO2: 25 meq/L (ref 19–32)
Calcium: 8.2 mg/dL — ABNORMAL LOW (ref 8.4–10.5)
Chloride: 96 meq/L (ref 96–112)
Creatinine, Ser: 1.75 mg/dL — ABNORMAL HIGH (ref 0.50–1.10)
GFR calc Af Amer: 36 mL/min — ABNORMAL LOW
GFR calc non Af Amer: 31 mL/min — ABNORMAL LOW
Glucose, Bld: 129 mg/dL — ABNORMAL HIGH (ref 70–99)
Potassium: 5.1 meq/L (ref 3.7–5.3)
Sodium: 131 meq/L — ABNORMAL LOW (ref 137–147)

## 2013-07-08 LAB — GLUCOSE, CAPILLARY
Glucose-Capillary: 113 mg/dL — ABNORMAL HIGH (ref 70–99)
Glucose-Capillary: 230 mg/dL — ABNORMAL HIGH (ref 70–99)
Glucose-Capillary: 136 mg/dL — ABNORMAL HIGH (ref 70–99)
Glucose-Capillary: 114 mg/dL — ABNORMAL HIGH (ref 70–99)

## 2013-07-08 LAB — CBC
HCT: 25.5 % — ABNORMAL LOW (ref 36.0–46.0)
Hemoglobin: 8.5 g/dL — ABNORMAL LOW (ref 12.0–15.0)
MCH: 28.6 pg (ref 26.0–34.0)
MCHC: 33.3 g/dL (ref 30.0–36.0)
MCV: 85.9 fL (ref 78.0–100.0)
Platelets: 204 10*3/uL (ref 150–400)
RBC: 2.97 MIL/uL — ABNORMAL LOW (ref 3.87–5.11)
RDW: 14.8 % (ref 11.5–15.5)
WBC: 11.5 10*3/uL — ABNORMAL HIGH (ref 4.0–10.5)

## 2013-07-08 MED ORDER — INSULIN ASPART 100 UNIT/ML ~~LOC~~ SOLN
0.0000 [IU] | Freq: Three times a day (TID) | SUBCUTANEOUS | Status: DC
Start: 2013-07-08 — End: 2013-07-08
  Administered 2013-07-08: 8 [IU] via SUBCUTANEOUS

## 2013-07-08 MED ORDER — MAGNESIUM HYDROXIDE 400 MG/5ML PO SUSP: 30.0000 mL | Freq: Every day | ORAL | Status: DC | PRN

## 2013-07-08 MED ORDER — INSULIN ASPART 100 UNIT/ML ~~LOC~~ SOLN
0.0000 [IU] | Freq: Every day | SUBCUTANEOUS | Status: DC
  Administered 2013-07-09 – 2013-07-12 (×3): 2 [IU] via SUBCUTANEOUS

## 2013-07-08 MED ORDER — FUROSEMIDE 40 MG PO TABS
40.0000 mg | ORAL_TABLET | Freq: Every day | ORAL | Status: DC
  Filled 2013-07-08: qty 1

## 2013-07-08 MED ORDER — INSULIN ASPART 100 UNIT/ML ~~LOC~~ SOLN: 4.0000 [IU] | Freq: Three times a day (TID) | SUBCUTANEOUS | Status: DC

## 2013-07-08 MED ORDER — METOPROLOL TARTRATE 12.5 MG HALF TABLET
12.5000 mg | ORAL_TABLET | Freq: Two times a day (BID) | ORAL | Status: DC
  Administered 2013-07-08: 12.5 mg via ORAL
  Filled 2013-07-08 (×2): qty 1

## 2013-07-08 MED ORDER — WARFARIN - PHYSICIAN DOSING INPATIENT
Freq: Every day | Status: DC
Start: 2013-07-08 — End: 2013-07-13

## 2013-07-08 MED ORDER — SODIUM CHLORIDE 0.9 % IJ SOLN
3.0000 mL | Freq: Two times a day (BID) | INTRAMUSCULAR | Status: DC
  Administered 2013-07-10 – 2013-07-12 (×4): 3 mL via INTRAVENOUS

## 2013-07-08 MED ORDER — SODIUM CHLORIDE 0.9 % IV SOLN: 250.0000 mL | INTRAVENOUS | Status: DC | PRN

## 2013-07-08 MED ORDER — MOVING RIGHT ALONG BOOK
Freq: Once | Status: DC
  Filled 2013-07-08: qty 1

## 2013-07-08 MED ORDER — INSULIN DETEMIR 100 UNIT/ML ~~LOC~~ SOLN
10.0000 [IU] | Freq: Every day | SUBCUTANEOUS | Status: DC
  Administered 2013-07-08: 10 [IU] via SUBCUTANEOUS
  Filled 2013-07-08: qty 0.1

## 2013-07-08 MED ORDER — SODIUM CHLORIDE 0.9 % IJ SOLN: 3.0000 mL | INTRAMUSCULAR | Status: DC | PRN

## 2013-07-08 MED ORDER — INSULIN ASPART 100 UNIT/ML ~~LOC~~ SOLN
0.0000 [IU] | Freq: Three times a day (TID) | SUBCUTANEOUS | Status: DC
  Administered 2013-07-08: 3 [IU] via SUBCUTANEOUS
  Administered 2013-07-10: 4 [IU] via SUBCUTANEOUS
  Administered 2013-07-10: 3 [IU] via SUBCUTANEOUS
  Administered 2013-07-11: 7 [IU] via SUBCUTANEOUS
  Administered 2013-07-12: 3 [IU] via SUBCUTANEOUS
  Administered 2013-07-12 – 2013-07-13 (×2): 4 [IU] via SUBCUTANEOUS
  Administered 2013-07-13: 7 [IU] via SUBCUTANEOUS
  Administered 2013-07-14: 4 [IU] via SUBCUTANEOUS

## 2013-07-08 MED ORDER — WARFARIN SODIUM 2.5 MG PO TABS
2.5000 mg | ORAL_TABLET | Freq: Once | ORAL | Status: DC
  Filled 2013-07-08: qty 1

## 2013-07-08 MED ORDER — METOLAZONE 5 MG PO TABS
5.0000 mg | ORAL_TABLET | Freq: Every day | ORAL | Status: AC
  Administered 2013-07-08: 5 mg via ORAL
  Filled 2013-07-08: qty 1

## 2013-07-08 MED ORDER — SOTALOL HCL 80 MG PO TABS
80.0000 mg | ORAL_TABLET | Freq: Two times a day (BID) | ORAL | Status: DC
  Administered 2013-07-08 (×2): 80 mg via ORAL
  Filled 2013-07-08 (×4): qty 1

## 2013-07-08 MED ORDER — FUROSEMIDE 10 MG/ML IJ SOLN
40.0000 mg | Freq: Once | INTRAMUSCULAR | Status: AC
  Administered 2013-07-08: 40 mg via INTRAVENOUS
  Filled 2013-07-08: qty 4

## 2013-07-08 MED ORDER — WARFARIN SODIUM 2 MG PO TABS
2.0000 mg | ORAL_TABLET | Freq: Every day | ORAL | Status: DC
  Administered 2013-07-08: 2 mg via ORAL
  Filled 2013-07-08 (×2): qty 1

## 2013-07-08 NOTE — Progress Notes (Addendum)
      [address]       [place] [address]             [phone]      6 Days Post-Op Procedure(s) (LRB): CORONARY ARTERY BYPASS GRAFTING (CABG) (N/A) INTRAOPERATIVE TRANSESOPHAGEAL ECHOCARDIOGRAM (N/A)  Subjective:  Patient feels much better today.  She had some minor dizziness upon first awakening.  She is requesting her foley be removed.  Objective: Vital signs in last 24 hours: Temp:  [97.4 F (36.3 C)-98.2 F (36.8 C)] 97.7 F (36.5 C) (05/06 0719) Pulse Rate:  [71-85] 83 (05/06 0700) Cardiac Rhythm:  [-] Normal sinus rhythm (05/06 0600) Resp:  [12-26] 21 (05/06 0700) BP: (82-131)/(41-79) 130/54 mmHg (05/06 0700) SpO2:  [93 %-98 %] 96 % (05/06 0700) Weight:  [187 lb 9.8 oz (85.1 kg)] 187 lb 9.8 oz (85.1 kg) (05/06 0500)  Intake/Output from previous day: 05/05 0701 - 05/06 0700 In: 900 [P.O.:900] Out: 2495 [Urine:1545]  General appearance: alert, cooperative and no distress Heart: irregularly irregular rhythm Lungs: clear to auscultation bilaterally Abdomen: soft, non-tender; bowel sounds normal; no masses,  no organomegaly Extremities: edema 1+ RLE> LLE Wound: clean and dry  Lab Results:  Recent Labs  07/07/13 0420 07/08/13 0430  WBC 11.5* 11.5*  HGB 9.2* 8.5*  HCT 27.1* 25.5*  PLT 168 204   BMET:  Recent Labs  07/07/13 0420 07/08/13 0430  NA 128* 131*  K 5.4* 5.1  CL 93* 96  CO2 26 25  GLUCOSE 171* 129*  BUN 57* 60*  CREATININE 1.63* 1.75*  CALCIUM 8.5 8.2*    PT/INR: No results found for this basename: LABPROT, INR,  in the last 72 hours ABG    Component Value Date/Time   PHART 7.371 07/02/2013 1808   HCO3 25.4* 07/02/2013 1903   TCO2 24 07/03/2013 1754   O2SAT 95.0 07/02/2013 1903   CBG (last 3)   Recent Labs  07/07/13 1648 07/07/13 2145 07/08/13 0716  GLUCAP 142* 122* 113*    Assessment/Plan: S/P Procedure(s) (LRB): CORONARY ARTERY BYPASS GRAFTING (CABG) (N/A) INTRAOPERATIVE TRANSESOPHAGEAL ECHOCARDIOGRAM  (N/A)  1. CV- Atrial Fibrillation, rate in the 100s- on Digoxin, add Lopressor 12.5 mg BID, start low dose Coumadin 2. Pulm- S/P Thoracentesis,  wean oxygen as tolerated, encouraged use of IS 3. Renal- creatinine remains elevated at 1.75, some volume overload, continue Lasix 4. Expected blood loss anemia- Hgb 8.5 5. GI- Constipation resolved 6. DM- sugars remain controlled, continue current insulin regimen 7. Dispo- patient feels better, back in A.Fib this morning- continue Digoxin, added Lopressor, Coumadin, D/C Foley?   LOS: 9 days    Erin Barrett 07/08/2013  DC foley Control a-fib with meds, start low dose coumadin Transfer to [facility]

## 2013-07-08 NOTE — Progress Notes (Signed)
07/08/2013 7:35 PM Nursing note Pt. Noted to have 3.4 sec pause and convert to NSR rate 64. Pt. States she feels fine. Dr. Roxy Manns paged and made aware of pt. Rhythm change. No new orders at this time. Will continue to monitor patient.  [place]

## 2013-07-08 NOTE — Plan of Care (Signed)
Problem: Phase III - Recovery through Discharge Goal: Discharge plan remains appropriate-arrangements made Outcome: Completed/Met Date Met:  07/08/13 Home with spouse

## 2013-07-09 ENCOUNTER — Inpatient Hospital Stay (HOSPITAL_COMMUNITY): Payer: No Typology Code available for payment source

## 2013-07-09 LAB — GLUCOSE, CAPILLARY
Glucose-Capillary: 49 mg/dL — ABNORMAL LOW (ref 70–99)
Glucose-Capillary: 63 mg/dL — ABNORMAL LOW (ref 70–99)
Glucose-Capillary: 91 mg/dL (ref 70–99)
Glucose-Capillary: 115 mg/dL — ABNORMAL HIGH (ref 70–99)
Glucose-Capillary: 59 mg/dL — ABNORMAL LOW (ref 70–99)
Glucose-Capillary: 97 mg/dL (ref 70–99)
Glucose-Capillary: 82 mg/dL (ref 70–99)
Glucose-Capillary: 154 mg/dL — ABNORMAL HIGH (ref 70–99)
Glucose-Capillary: 211 mg/dL — ABNORMAL HIGH (ref 70–99)

## 2013-07-09 LAB — CBC
HCT: 25.8 % — ABNORMAL LOW (ref 36.0–46.0)
Hemoglobin: 8.7 g/dL — ABNORMAL LOW (ref 12.0–15.0)
MCH: 29 pg (ref 26.0–34.0)
MCHC: 33.7 g/dL (ref 30.0–36.0)
MCV: 86 fL (ref 78.0–100.0)
Platelets: 224 10*3/uL (ref 150–400)
RBC: 3 MIL/uL — ABNORMAL LOW (ref 3.87–5.11)
RDW: 14.8 % (ref 11.5–15.5)
WBC: 10.8 10*3/uL — ABNORMAL HIGH (ref 4.0–10.5)

## 2013-07-09 LAB — BASIC METABOLIC PANEL
BUN: 70 mg/dL — ABNORMAL HIGH (ref 6–23)
CO2: 21 mEq/L (ref 19–32)
Calcium: 8.6 mg/dL (ref 8.4–10.5)
Chloride: 92 mEq/L — ABNORMAL LOW (ref 96–112)
Creatinine, Ser: 1.89 mg/dL — ABNORMAL HIGH (ref 0.50–1.10)
GFR calc Af Amer: 32 mL/min — ABNORMAL LOW (ref >90)
GFR calc non Af Amer: 28 mL/min — ABNORMAL LOW (ref >90)
Glucose, Bld: 76 mg/dL (ref 70–99)
Potassium: 6.9 mEq/L (ref 3.7–5.3)
Sodium: 127 mEq/L — ABNORMAL LOW (ref 137–147)

## 2013-07-09 LAB — PROTIME-INR
INR: 1.11 (ref 0.00–1.49)
Prothrombin Time: 14.1 seconds (ref 11.6–15.2)

## 2013-07-09 LAB — POTASSIUM: Potassium: 5.9 mEq/L — ABNORMAL HIGH (ref 3.7–5.3)

## 2013-07-09 MED ORDER — FUROSEMIDE 40 MG PO TABS
40.0000 mg | ORAL_TABLET | Freq: Every day | ORAL | Status: DC
  Filled 2013-07-09: qty 1

## 2013-07-09 MED ORDER — COUMADIN BOOK
Freq: Once | Status: AC
  Administered 2013-07-09: 12:00:00
  Filled 2013-07-09: qty 1

## 2013-07-09 MED ORDER — TRAZODONE 25 MG HALF TABLET
25.0000 mg | ORAL_TABLET | Freq: Every day | ORAL | Status: DC
  Administered 2013-07-09 – 2013-07-13 (×5): 25 mg via ORAL
  Filled 2013-07-09 (×6): qty 1

## 2013-07-09 MED ORDER — WARFARIN VIDEO: Freq: Once | Status: DC

## 2013-07-09 MED ORDER — ASPIRIN 81 MG PO CHEW
81.0000 mg | CHEWABLE_TABLET | Freq: Every day | ORAL | Status: DC
  Filled 2013-07-09: qty 1

## 2013-07-09 MED ORDER — SODIUM POLYSTYRENE SULFONATE 15 GM/60ML PO SUSP
15.0000 g | Freq: Four times a day (QID) | ORAL | Status: DC
  Filled 2013-07-09 (×3): qty 60

## 2013-07-09 MED ORDER — SODIUM POLYSTYRENE SULFONATE 15 GM/60ML PO SUSP
15.0000 g | Freq: Two times a day (BID) | ORAL | Status: DC
  Administered 2013-07-09 – 2013-07-10 (×4): 15 g via RECTAL
  Filled 2013-07-09 (×3): qty 60

## 2013-07-09 MED ORDER — SOTALOL HCL 80 MG PO TABS
80.0000 mg | ORAL_TABLET | Freq: Two times a day (BID) | ORAL | Status: DC
  Administered 2013-07-10 – 2013-07-11 (×3): 80 mg via ORAL
  Filled 2013-07-09 (×4): qty 1

## 2013-07-09 MED ORDER — INSULIN ASPART 100 UNIT/ML ~~LOC~~ SOLN: 2.0000 [IU] | Freq: Three times a day (TID) | SUBCUTANEOUS | Status: DC

## 2013-07-09 MED ORDER — ASPIRIN EC 81 MG PO TBEC
81.0000 mg | DELAYED_RELEASE_TABLET | Freq: Every day | ORAL | Status: DC
Start: 2013-07-09 — End: 2013-07-13
  Administered 2013-07-09 – 2013-07-13 (×5): 81 mg via ORAL
  Filled 2013-07-09 (×5): qty 1

## 2013-07-09 MED ORDER — INSULIN DETEMIR 100 UNIT/ML ~~LOC~~ SOLN
10.0000 [IU] | Freq: Every day | SUBCUTANEOUS | Status: DC
Start: 2013-07-10 — End: 2013-07-10

## 2013-07-09 MED ORDER — WARFARIN SODIUM 2.5 MG PO TABS
2.5000 mg | ORAL_TABLET | Freq: Every day | ORAL | Status: DC
  Administered 2013-07-09 – 2013-07-10 (×2): 2.5 mg via ORAL
  Filled 2013-07-09 (×3): qty 1

## 2013-07-09 NOTE — Progress Notes (Signed)
CRITICAL VALUE ALERT  Critical value received: K+ 6.9  Date of notification:  May 7th 2015  Time of notification:  1315  Critical value read back:yes YES  Nurse who received alert:  Evangeline Dakin  MD notified (1st page):  Lars Pinks  Time of first page:  1317  MD notified (2nd page): NA  Time of second page: NA  Responding MD:  Lars Pinks  Time MD responded:  1322

## 2013-07-09 NOTE — Progress Notes (Signed)
Pt attached to external pacer running AAI at 80 per order.  Pt and husband cautioned about care of box/prevention of dropping and voiced understanding.  Will con't plan of care.

## 2013-07-09 NOTE — Progress Notes (Addendum)
      [address]       [place] [address]             [phone]        7 Days Post-Op Procedure(s) (LRB): CORONARY ARTERY BYPASS GRAFTING (CABG) (N/A) INTRAOPERATIVE TRANSESOPHAGEAL ECHOCARDIOGRAM (N/A)  Subjective: Patient with nausea and had dry heaves yesterday. Feels lousy this am as glucose in the 50's. Patient requesting if possible, to decrease Trazadone at night for sleep. She is unable to take Ambien, but needs something.  Objective: Vital signs in last 24 hours: Temp:  [97.8 F (36.6 C)-98.5 F (36.9 C)] 98 F (36.7 C) (05/07 0511) Pulse Rate:  [56-110] 56 (05/07 0511) Cardiac Rhythm:  [-] Normal sinus rhythm (05/06 1952) Resp:  [18-23] 18 (05/07 0511) BP: (90-125)/(56-64) 111/64 mmHg (05/07 0511) SpO2:  [90 %-100 %] 95 % (05/07 0511) Weight:  [187 lb 6.3 oz (85 kg)] 187 lb 6.3 oz (85 kg) (05/07 0511)  Pre op weight 73 kg Current Weight  07/09/13 187 lb 6.3 oz (85 kg)      Intake/Output from previous day: 05/06 0701 - 05/07 0700 In: 480 [P.O.:480] Out: 990 [Urine:990]   Physical Exam:  Cardiovascular: Slightly bradycardia Pulmonary:Slightly decreased at bases; no rales, wheezes, or rhonchi. Abdomen: Soft, non tender, bowel sounds present. Extremities: Bilateral lower extremity edema. Ecchymosis right thigh Wounds: Clean and dry.  No erythema or signs of infection.  Lab Results: CBC: Recent Labs  07/08/13 0430 07/09/13 0453  WBC 11.5* 10.8*  HGB 8.5* 8.7*  HCT 25.5* 25.8*  PLT 204 224   BMET:  Recent Labs  07/07/13 0420 07/08/13 0430  NA 128* 131*  K 5.4* 5.1  CL 93* 96  CO2 26 25  GLUCOSE 171* 129*  BUN 57* 60*  CREATININE 1.63* 1.75*  CALCIUM 8.5 8.2*    PT/INR:  Lab Results  Component Value Date   INR 1.11 07/09/2013   INR 1.48 07/02/2013   INR 1.09 06/30/2013   ABG:  INR: Will add last result for INR, ABG once components are confirmed Will add last 4 CBG results once components are  confirmed  Assessment/Plan:  1. CV - S/p NSTEMI. Previous a fib. SB in the 50's this am. On Sotalol 80 bid and Digoxin 0.125 daily and Coumadin. INR 1.11. Decrease ecasa to 81 as is on Coumadin.Stop Digoxin and hold Sotalol this am as bradycardic 2.  Pulmonary - Encourage incentive spirometer 3. Volume Overload - S/p left thoracenteses 5/5. 950 ml removed. On Lasix 40 daily. Will hold this am 4.  Acute blood loss anemia - H and H this am 8.7 25.8. Continue Trinsicon. 5. DM-CBGs 136/114/59. Pre op HGA1C 6.9. On Metformin 500 bid and Insulin pre op. On Insulin only. Will not restart Metformin as last creatinine up to 1.75. No scheduled insulin today as glucose this am in the 50's. Use SS PRN. 6. Recheck BMET in am 7.GI-patient without abdominal pain, but with nausea and dry heaves yesterday. Hass had a bowel movement.She is going to try toast and graham crackers this am. Monitor as may need to make NPO. May need to consider reglan 8.Regarding Trazadone, I will decrease to 25 at night to see if helps   Quindon Denker M ZimmermanPA-C 07/09/2013,7:36 AM

## 2013-07-09 NOTE — Discharge Instructions (Addendum)
Activity: 1.May walk up steps °               2.No lifting more than ten pounds for four weeks.  °               3.No driving for four weeks. °               4.Stop any activity that causes chest pain, shortness of breath, dizziness,                            sweating or excessive weakness. °               5.Avoid straining. °               6.Continue with your breathing exercises daily. ° °Diet: Diabetic diet and Low fat, Low salt  diet ° °Wound Care: May shower.  Clean wounds with mild soap and water daily. Contact the office at [phone] if any problems arise. ° °Coronary Artery Bypass Grafting, Care After °Refer to this sheet in the next few weeks. These instructions provide you with information on caring for yourself after your procedure. Your health care provider may also give you more specific instructions. Your treatment has been planned according to current medical practices, but problems sometimes occur. Call your health care provider if you have any problems or questions after your procedure. °WHAT TO EXPECT AFTER THE PROCEDURE °Recovery from surgery will be different for everyone. Some people feel well after 3 or 4 weeks, while for others it takes longer. After your procedure, it is typical to have the following: °· Nausea and a lack of appetite.   °· Constipation. °· Weakness and fatigue.   °· Depression or irritability.   °· Pain or discomfort at your incision site. °HOME CARE INSTRUCTIONS °· Only take over-the-counter or prescription medicines as directed by your health care provider. Take all medicines exactly as directed. Do not stop taking medicines or start any new medicines without first checking with your health care provider.   °· Take your pulse as directed by your health care provider. °· Perform deep breathing as directed by your health care provider. If you were given a device called an incentive spirometer, use it to practice deep breathing several times a day. Support your chest with  a pillow or your arms when you take deep breaths or cough. °· Keep incision areas clean, dry, and protected. Remove or change any bandages (dressings) only as directed by your health care provider. You may have skin adhesive strips over the incision areas. Do not take the strips off. They will fall off on their own. °· Check incision areas daily for any swelling, redness, or drainage. °· If incisions were made in your legs, do the following: °· Avoid crossing your legs.   °· Avoid sitting for long periods of time. Change positions every 30 minutes.   °· Elevate your legs when you are sitting.   °· Wear compression stockings as directed by your health care provider. These stockings help keep blood clots from forming in your legs. °· Take showers once your health care provider approves. Until then, only take sponge baths. Pat incisions dry. Do not rub incisions with a washcloth or towel. Do not take tub baths or go swimming until your health care provider approves. °· Eat foods that are high in fiber, such as raw fruits and vegetables, whole grains, beans, and nuts. Meats should be lean cut. Avoid   canned, processed, and fried foods. °· Drink enough fluids to keep your urine clear or pale yellow. °· Weigh yourself every day. This helps identify if you are retaining fluid that may make your heart and lungs work harder.   °· Rest and limit activity as directed by your health care provider. You may be instructed to: °· Stop any activity at once if you have chest pain, shortness of breath, irregular heartbeats, or dizziness. Get help right away if you have any of these symptoms. °· Move around frequently for short periods or take short walks as directed by your health care provider. Increase your activities gradually. You may need physical therapy or cardiac rehabilitation to help strengthen your muscles and build your endurance. °· Avoid lifting, pushing, or pulling anything heavier than 10 lb (4.5 kg) for at least 6  weeks after surgery. °· Do not drive until your health care provider approves.  °· Ask your health care provider when you may return to work and resume sexual activity. °· Follow up with your health care provider as directed.   °SEEK MEDICAL CARE IF: °· You have swelling, redness, increasing pain, or drainage at the site of an incision.   °· You develop a fever.   °· You have swelling in your ankles or legs.   °· You have pain in your legs.   °· You have weight gain of 2 or more pounds a day. °· You are nauseous or vomit. °· You have diarrhea.  °SEEK IMMEDIATE MEDICAL CARE IF: °· You have chest pain that goes to your jaw or arms. °· You have shortness of breath.   °· You have a fast or irregular heartbeat.   °· You notice a "clicking" in your breastbone (sternum) when you move.   °· You have numbness or weakness in your arms or legs. °· You feel dizzy or lightheaded.   °MAKE SURE YOU: °· Understand these instructions. °· Will watch your condition. °· Will get help right away if you are not doing well or get worse. °Document Released: 09/08/2004 Document Revised: 10/22/2012 Document Reviewed: 07/29/2012 °[employer]® Patient Information ©2014 [employer]. ° ° ° °

## 2013-07-09 NOTE — Progress Notes (Signed)
CARDIAC REHAB PHASE I   PRE:  Rate/Rhythm: 80 paced  BP:  Supine:   Sitting: 135/93  Standing:    SaO2: 92% 2L  MODE:  Ambulation: 150 ft   POST:  Rate/Rhythm: 80 paced  BP:  Supine:   Sitting: 112/92  Standing:    SaO2: 90%2L 1115-1150 Pt walked 150 ft on 2L with gait belt use,rolling walker and asst x 2. Pt c/o feeling lightheaded but did not worsen with walk. Has been having lightheadedness which she states limits walking. Did not need to take a standing rest break. Tolerated well even though not feeling well. To recliner with call bell. Left on 2L. Encouraged IS.   Graylon Good, RN BSN  07/09/2013 11:47 AM

## 2013-07-10 ENCOUNTER — Inpatient Hospital Stay (HOSPITAL_COMMUNITY): Payer: No Typology Code available for payment source

## 2013-07-10 LAB — PROTIME-INR
INR: 1.08 (ref 0.00–1.49)
Prothrombin Time: 13.8 seconds (ref 11.6–15.2)

## 2013-07-10 LAB — BASIC METABOLIC PANEL
BUN: 70 mg/dL — ABNORMAL HIGH (ref 6–23)
CO2: 25 mEq/L (ref 19–32)
Calcium: 8.5 mg/dL (ref 8.4–10.5)
Chloride: 93 mEq/L — ABNORMAL LOW (ref 96–112)
Creatinine, Ser: 1.71 mg/dL — ABNORMAL HIGH (ref 0.50–1.10)
GFR calc Af Amer: 37 mL/min — ABNORMAL LOW (ref >90)
GFR calc non Af Amer: 32 mL/min — ABNORMAL LOW (ref >90)
Glucose, Bld: 133 mg/dL — ABNORMAL HIGH (ref 70–99)
Potassium: 6 mEq/L — ABNORMAL HIGH (ref 3.7–5.3)
Sodium: 130 mEq/L — ABNORMAL LOW (ref 137–147)

## 2013-07-10 LAB — GLUCOSE, CAPILLARY
Glucose-Capillary: 143 mg/dL — ABNORMAL HIGH (ref 70–99)
Glucose-Capillary: 112 mg/dL — ABNORMAL HIGH (ref 70–99)
Glucose-Capillary: 178 mg/dL — ABNORMAL HIGH (ref 70–99)
Glucose-Capillary: 247 mg/dL — ABNORMAL HIGH (ref 70–99)

## 2013-07-10 LAB — CBC
HCT: 27 % — ABNORMAL LOW (ref 36.0–46.0)
Hemoglobin: 8.9 g/dL — ABNORMAL LOW (ref 12.0–15.0)
MCH: 28.8 pg (ref 26.0–34.0)
MCHC: 33 g/dL (ref 30.0–36.0)
MCV: 87.4 fL (ref 78.0–100.0)
Platelets: 268 10*3/uL (ref 150–400)
RBC: 3.09 MIL/uL — ABNORMAL LOW (ref 3.87–5.11)
RDW: 14.5 % (ref 11.5–15.5)
WBC: 9.5 10*3/uL (ref 4.0–10.5)

## 2013-07-10 MED ORDER — FUROSEMIDE 10 MG/ML IJ SOLN
40.0000 mg | Freq: Every day | INTRAMUSCULAR | Status: DC
  Administered 2013-07-10 – 2013-07-11 (×2): 40 mg via INTRAVENOUS
  Filled 2013-07-10 (×2): qty 4

## 2013-07-10 MED ORDER — INSULIN DETEMIR 100 UNIT/ML ~~LOC~~ SOLN
5.0000 [IU] | Freq: Every day | SUBCUTANEOUS | Status: DC
  Administered 2013-07-10 – 2013-07-13 (×4): 5 [IU] via SUBCUTANEOUS
  Filled 2013-07-10 (×7): qty 0.05

## 2013-07-10 NOTE — Progress Notes (Addendum)
      [address]       [place] [address]             [phone]        8 Days Post-Op Procedure(s) (LRB): CORONARY ARTERY BYPASS GRAFTING (CABG) (N/A) INTRAOPERATIVE TRANSESOPHAGEAL ECHOCARDIOGRAM (N/A)  Subjective: Patient feeling better this morning. She is eating breakfast. No further nausea or dry heaves.  Objective: Vital signs in last 24 hours: Temp:  [97.6 F (36.4 C)-97.7 F (36.5 C)] 97.6 F (36.4 C) (05/08 0556) Pulse Rate:  [80-81] 81 (05/08 0556) Cardiac Rhythm:  [-] Atrial paced (05/07 2015) Resp:  [17-18] 17 (05/08 0556) BP: (105-148)/(65-77) 148/77 mmHg (05/08 0556) SpO2:  [91 %-97 %] 97 % (05/08 0556) Weight:  [189 lb 9.5 oz (86 kg)] 189 lb 9.5 oz (86 kg) (05/08 0556)  Pre op weight 73 kg Current Weight  07/10/13 189 lb 9.5 oz (86 kg)      Intake/Output from previous day: 05/07 0701 - 05/08 0700 In: 300 [P.O.:300] Out: 400 [Urine:400]   Physical Exam:  Cardiovascular: AAI paced at 80, RRR Pulmonary:Slightly decreased at bases; no rales, wheezes, or rhonchi. Abdomen: Soft, non tender, bowel sounds present. Extremities: Bilateral lower extremity edema. Ecchymosis right thigh Wounds: Clean and dry.  No erythema or signs of infection.  Lab Results: CBC:  Recent Labs  07/09/13 0453 07/10/13 0515  WBC 10.8* 9.5  HGB 8.7* 8.9*  HCT 25.8* 27.0*  PLT 224 268   BMET:   Recent Labs  07/09/13 1120 07/09/13 1750 07/10/13 0515  NA 127*  --  130*  K 6.9* 5.9* 6.0*  CL 92*  --  93*  CO2 21  --  25  GLUCOSE 76  --  133*  BUN 70*  --  70*  CREATININE 1.89*  --  1.71*  CALCIUM 8.6  --  8.5    PT/INR:  Lab Results  Component Value Date   INR 1.08 07/10/2013   INR 1.11 07/09/2013   INR 1.48 07/02/2013   ABG:  INR: Will add last result for INR, ABG once components are confirmed Will add last 4 CBG results once components are confirmed  Assessment/Plan:  1. CV - S/p NSTEMI. Previous a fib with RVR. SR in the 80's  this am (AAI paced). On Sotalol 80 bid (with parameters) and Coumadin. INR 1..08.  2.  Pulmonary - S/p left thoracenteses 5/5. 950 ml removed. On 2 liters via [place]-wean as tolerates.Encourage incentive spirometer and flutter valve. 3. Volume Overload - Will give Lasix this am and monitor creatinine. 4.  Acute blood loss anemia - H and H this am 8.9 and  27. Continue Trinsicon. 5. DM-CBGs 154/211/143. Pre op HGA1C 6.9. On Metformin 500 bid and Insulin pre op. On Insulin only. Will not restart Metformin as last creatinine 1.71. Will restart scheduled insulin as was stopped yesterday bc of hypoglycemia. 6. Recheck BMET in am 7. Hyperkalemia-was given Kayexalate as was up to 6.9. Now 6. To be given Lasix without potassium supplement.Monitor   Jemel Ono M ZimmermanPA-C 07/10/2013,7:39 AM

## 2013-07-10 NOTE — Progress Notes (Signed)
CARDIAC REHAB PHASE I   PRE:  Rate/Rhythm: 80 paced  BP:  Supine: 111/56  Sitting:   Standing:    SaO2: 95%3L  MODE:  Ambulation: 350 ft   POST:  Rate/Rhythm: 80 paced  BP:  Supine:   Sitting: 95/66  Standing:    SaO2: 94%2L 0800-0833 Pt feeling better today. Pt walked 350 ft on 2L with gait belt use, rolling walker and asst x 2. Stated still lightheaded but not as bad as yesterday. Had one loss of balance with turning. To recliner after walk. Left on 2L. Husband in room. Will keep as asst x 2.   Graylon Good, RN BSN  07/10/2013 8:30 AM

## 2013-07-10 NOTE — Discharge Summary (Addendum)
[address]       [place] [address]             [phone]              Discharge Summary  Name: Jamie Arias DOB: 22-Aug-1955 58 y.o. MRN: [id number]   Admission Date: 06/29/2013 Discharge Date: 07/14/2013    Admitting Diagnosis: Chest pain   Discharge Diagnosis:  Non-ST elevation myocardial infarction Severe three vessel coronary artery disease Expected postoperative blood loss anemia   Past Medical History  Diagnosis Date  . Diabetes mellitus without complication   . Hypertension   . Hyperlipidemia   . Hypothyroidism   . Diabetic neuropathy       Procedures: CORONARY ARTERY BYPASS GRAFTING x 3 (Left internal mammary artery to left anterior descending, saphenous vein graft to obtuse marginal, saphenous vein graft to posterior descending) ENDOSCOPIC VEIN HARVEST RIGHT LEG - 07/02/2013   HPI:  The patient is a 58 y.o. female who presented to the Emergency Department at [facility] on 06/28/2013 with complaints of severe shortness of breath, orthopnea, and cough when she tries to lie down. She actually had some chest pain located substernally on Friday which lasted for about 1 hour. She felt better on Saturday, but noticed she was short of breath. She had an additional episode of chest tightness Saturday night. Then on the morning of presentation, the patient became severely short of breath, prompting presentation to the ED. Workup in the ED found the patient to be hypoxic with sats of 68%. A BNP was elevated at 9000 and initial Troponin level was at 2. She was treated with IV lasix, which provided relief of symptoms. She was also felt to have pneumonia and started on Rocephin and Zithromax. A Cardiology consult was obtained and the patient was evaluated by Dr. Fletcher Anon. He felt the patient most likely suffered an MI on Friday. He performed an echocardiogram which showed a reduced EF of 35-40% with severe inferior, inferoseptal, and  inferolateral hypokineses.She underwent  urgent cardiac catheterization which showed severe 3 vessel CAD involving RCA, Left Circumflex, and LAD. It was felt she would benefit from urgent CABG and was transferred to [facility] for further evaluation.    Hospital Course:  The patient was admitted to [facility] on 06/29/2013.  Upon arrival, the patient was placed on a Heparin drip. She has remained chest pain free since admission and her shortness of breath is significantly improved.  A cardiac surgery consult was requested for consideration of surgical revascularization.  Dr. Prescott Gum saw the patient and agreed that she would benefit from CABG with possible mitral valve repair. All risks, benefits and alternatives of surgery were explained in detail, and the patient agreed to proceed.   The patient was taken to the operating room and underwent the above procedure.  Intraoperative TEE showed only mild mitral regurgitation, therefore there was no intervention to the mitral valve.  The postoperative course was notable for early hypotension and she was maintained on low dose pressors. She remained hypotensive, and was transfused a unit of packed red blood cells for symptomatic blood loss anemia. She developed atrial fibrillation and was started on IV Amiodarone.  She did convert to sinus rhythm, but developed persistent nausea related to the Amiodarone, so this was discontinued.  She reverted back to rate controlled atrial fibrillation, and was started on Digoxin, as well as low dose Lopressor and Coumadin. She then became bradycardic, and the  Lopressor was switched to Sotalol. She also briefly required external pacing.   A left ultrasound guided thoracentesis was performed on 5/5 for a persistent left effusion, and around 950 ml of serosanguinous fluid was drained. Her creatinine became elevated postop, peaking at 1.89.  She has been weaned to room air. She has been tolerating a diet and has had a bowel  movement. She is maintaining sinus rhythm in the 60's. Per Dr. Prescott Gum, Coumadin was stopped. She was maintaining sinus rhythm and compliance would be an issue. She is felt surgically stable for discharge today.   Recent vital signs:  Filed Vitals:   07/14/13 0647  BP: 136/77  Pulse: 67  Temp: 97.4 F (36.3 C)  Resp: 18    Recent laboratory studies:  CBC:  Recent Labs  07/12/13 0408 07/13/13 0505  WBC 7.7 7.9  HGB 8.4* 8.3*  HCT 25.5* 25.4*  PLT 249 254   BMET:   Recent Labs  07/12/13 0408 07/13/13 0505  NA 133* 139  K 4.7 4.4  CL 96 99  CO2 29 29  GLUCOSE 133* 106*  BUN 65* 56*  CREATININE 1.44* 1.30*  CALCIUM 8.1* 8.4    PT/INR:   Recent Labs  07/13/13 0505  LABPROT 14.8  INR 1.19     Discharge Medications:     Medication List    STOP taking these medications       hydrochlorothiazide 25 MG tablet  Commonly known as:  HYDRODIURIL     lisinopril 40 MG tablet  Commonly known as:  PRINIVIL,ZESTRIL     metoprolol succinate 25 MG 24 hr tablet  Commonly known as:  TOPROL XL      TAKE these medications       aspirin 325 MG EC tablet  Take 1 tablet (325 mg total) by mouth daily.     ferrous [id number] C-folic acid capsule  Commonly known as:  TRINSICON / FOLTRIN  Take 1 capsule by mouth every morning. For one month then stop.     freestyle lancets  USE TO TEST BLOOD SUGAR FOUR TIMES DAILY     furosemide 40 MG tablet  Commonly known as:  LASIX  Take 1 tablet (40 mg total) by mouth daily. For 10 days then stop.     gabapentin 300 MG capsule  Commonly known as:  NEURONTIN  Take 1 capsule (300 mg total) by mouth 3 (three) times daily.     glucose blood test strip  Please dispense Contour next EZ test strips. She is testing 4 times a day. Diagnosis code 250.0     insulin detemir 100 UNIT/ML injection  Commonly known as:  LEVEMIR  Inject 12 Units into the skin at bedtime.     INSULIN SYRINGE .5CC/30GX1/2" 30G X 1/2" 0.5 ML  Misc  Use as directed     levothyroxine 50 MCG tablet  Commonly known as:  SYNTHROID, LEVOTHROID  Take 50 mcg by mouth daily.     metFORMIN 500 MG tablet  Commonly known as:  GLUCOPHAGE  Take 1 tablet (500 mg total) by mouth 2 (two) times daily with a meal.  Start taking on:  07/15/2013     simvastatin 10 MG tablet  Commonly known as:  ZOCOR  Take 1 tablet (10 mg total) by mouth at bedtime.     sotalol 80 MG tablet  Commonly known as:  BETAPACE  Take 1 tablet (80 mg total) by mouth daily.     traZODone 25 mg Tabs tablet  Commonly known as:  DESYREL  Take 0.5 tablets (25 mg total) by mouth at bedtime.        Discharge Instructions:  The patient is to refrain from driving, heavy lifting or strenuous activity.  May shower daily and clean incisions with soap and water.  May resume regular diet.   Follow Up: Follow-up Information   Schedule an appointment as soon as possible for a visit with Kathlyn [place], MD. (Please call for an appointment for 2 weeks)    Specialty:  Cardiology   Contact information:   [place] [address] [phone]       Follow up with Len Childs, MD On 08/05/2013. (Have a chest x-ray at [facility] at 11:30, then see MD at 12:30 )    Specialty:  Cardiothoracic Surgery   Contact information:   [address] [place] [address] [phone]       Follow up with Rey,Raquel, NP. (Call for follow up appointment regarding further diabetes managment and surveillance of HGA1C 6.9)    Specialty:  Nurse Practitioner   Contact information:   [id number] [place] [address] [phone]        Future Appointments Provider Department Dept Phone   08/05/2013 12:30 PM Ivin [name], MD [facility] [place] [phone]     The patient has been discharged on:   1.Beta Blocker: Yes [ x ]  No [ ]   If No, reason:    2.Ace Inhibitor/ARB: Yes [ ]     No [ x ]  If No, reason: Postop renal insufficiency and hypotension   3.Statin: Yes [ x]  No [ ]   If No, reason:    4.Ecasa: Yes [ x ]  No [ ]   If No, reason:     Nani Skillern PA-C 07/14/2013, 7:42 AM

## 2013-07-11 LAB — BASIC METABOLIC PANEL
BUN: 67 mg/dL — ABNORMAL HIGH (ref 6–23)
CO2: 25 mEq/L (ref 19–32)
Calcium: 8.6 mg/dL (ref 8.4–10.5)
Chloride: 95 mEq/L — ABNORMAL LOW (ref 96–112)
Creatinine, Ser: 1.55 mg/dL — ABNORMAL HIGH (ref 0.50–1.10)
GFR calc Af Amer: 41 mL/min — ABNORMAL LOW (ref >90)
GFR calc non Af Amer: 36 mL/min — ABNORMAL LOW (ref >90)
Glucose, Bld: 174 mg/dL — ABNORMAL HIGH (ref 70–99)
Potassium: 6.2 mEq/L — ABNORMAL HIGH (ref 3.7–5.3)
Sodium: 131 mEq/L — ABNORMAL LOW (ref 137–147)

## 2013-07-11 LAB — GLUCOSE, CAPILLARY
Glucose-Capillary: 120 mg/dL — ABNORMAL HIGH (ref 70–99)
Glucose-Capillary: 237 mg/dL — ABNORMAL HIGH (ref 70–99)
Glucose-Capillary: 173 mg/dL — ABNORMAL HIGH (ref 70–99)

## 2013-07-11 LAB — PROTIME-INR
INR: 1 (ref 0.00–1.49)
Prothrombin Time: 13 seconds (ref 11.6–15.2)

## 2013-07-11 MED ORDER — SODIUM POLYSTYRENE SULFONATE 15 GM/60ML PO SUSP
15.0000 g | Freq: Two times a day (BID) | ORAL | Status: DC
  Administered 2013-07-11 (×2): 15 g via ORAL
  Filled 2013-07-11 (×5): qty 60

## 2013-07-11 MED ORDER — FUROSEMIDE 10 MG/ML IJ SOLN
80.0000 mg | Freq: Every day | INTRAMUSCULAR | Status: DC
  Administered 2013-07-12 – 2013-07-13 (×2): 80 mg via INTRAVENOUS
  Filled 2013-07-11 (×4): qty 8

## 2013-07-11 MED ORDER — WARFARIN SODIUM 5 MG PO TABS
5.0000 mg | ORAL_TABLET | Freq: Once | ORAL | Status: AC
  Administered 2013-07-11: 5 mg via ORAL
  Filled 2013-07-11: qty 1

## 2013-07-11 MED ORDER — SOTALOL HCL 80 MG PO TABS
80.0000 mg | ORAL_TABLET | Freq: Every day | ORAL | Status: DC
  Administered 2013-07-12: 80 mg via ORAL
  Filled 2013-07-11 (×3): qty 1

## 2013-07-11 MED ORDER — ALTEPLASE 2 MG IJ SOLR
2.0000 mg | Freq: Once | INTRAMUSCULAR | Status: AC
  Administered 2013-07-11: 2 mg
  Filled 2013-07-11: qty 2

## 2013-07-11 NOTE — Progress Notes (Addendum)
       [address]       [place] [address]             [phone]          9 Days Post-Op Procedure(s) (LRB): CORONARY ARTERY BYPASS GRAFTING (CABG) (N/A) INTRAOPERATIVE TRANSESOPHAGEAL ECHOCARDIOGRAM (N/A)  Subjective: Not feeling as well today.  Very fatigued and weak. Nausea and vomiting resolved.    Objective: Vital signs in last 24 hours: Patient Vitals for the past 24 hrs:  BP Temp Temp src Pulse Resp SpO2 Weight  07/11/13 0620 139/73 mmHg 98.1 F (36.7 C) Oral 79 18 98 % 187 lb 9.8 oz (85.1 kg)  07/10/13 2225 100/47 mmHg 98.5 F (36.9 C) Oral 80 18 90 % -  07/10/13 1422 100/64 mmHg 98.7 F (37.1 C) Oral 80 20 - -   Current Weight  07/11/13 187 lb 9.8 oz (85.1 kg)  Pre op weight 73 kg    Intake/Output from previous day: 05/08 0701 - 05/09 0700 In: -  Out: 401 [Urine:400; Stool:1]  CBGs 247-120-174   PHYSICAL EXAM:  Heart: Bradycardic under pacer, 50s Lungs:Decreased BS in bases Wound: Clean and dry Extremities: +LE edema    Lab Results: CBC: Recent Labs  07/09/13 0453 07/10/13 0515  WBC 10.8* 9.5  HGB 8.7* 8.9*  HCT 25.8* 27.0*  PLT 224 268   BMET:  Recent Labs  07/10/13 0515 07/11/13 0904  NA 130* 131*  K 6.0* 6.2*  CL 93* 95*  CO2 25 25  GLUCOSE 133* 174*  BUN 70* 67*  CREATININE 1.71* 1.55*  CALCIUM 8.5 8.6    PT/INR:  Recent Labs  07/11/13 0904  LABPROT 13.0  INR 1.00      Assessment/Plan: S/P Procedure(s) (LRB): CORONARY ARTERY BYPASS GRAFTING (CABG) (N/A) INTRAOPERATIVE TRANSESOPHAGEAL ECHOCARDIOGRAM (N/A)  CV- Postop AF, tachy/brady.  Presently paced at 80, but brady under pacer in 59s.  On Sotalol.  Coumadin 2.5 being given, but INR drifting down.  Will give 5 mg today and watch.  Hyperkalemia- K continues to rise despite Kayexelate.  She also appears pretty edematous.  Will continue Lasix IV today and watch.  Mild postop renal insufficiency- Cr trending down.  Watch.  DM- pt had  been hypoglycemic and insulin held.  Now hyperglycemic.  Insulin resumed last night.  Will continue to monitor.  CRPI, pulm toilet.   LOS: 12 days    Coolidge Breeze 07/11/2013   Chart reviewed, patient examined, agree with above. She feels better this afternoon. She is in sinus brady and pacing at 80. On Sotolol 80 bid. With her creat clearance will switch to 80 mg daily.  She is hyperkalemic for unclear reasons. She has been getting some kayexalate enemas. Will give it po. Her creat has been elevated postop but trend is improving. Her creat was normal on 07/02/2013.

## 2013-07-12 LAB — BASIC METABOLIC PANEL
BUN: 65 mg/dL — ABNORMAL HIGH (ref 6–23)
CO2: 29 mEq/L (ref 19–32)
Calcium: 8.1 mg/dL — ABNORMAL LOW (ref 8.4–10.5)
Chloride: 96 mEq/L (ref 96–112)
Creatinine, Ser: 1.44 mg/dL — ABNORMAL HIGH (ref 0.50–1.10)
GFR calc Af Amer: 45 mL/min — ABNORMAL LOW (ref >90)
GFR calc non Af Amer: 39 mL/min — ABNORMAL LOW (ref >90)
Glucose, Bld: 133 mg/dL — ABNORMAL HIGH (ref 70–99)
Potassium: 4.7 mEq/L (ref 3.7–5.3)
Sodium: 133 mEq/L — ABNORMAL LOW (ref 137–147)

## 2013-07-12 LAB — GLUCOSE, CAPILLARY
Glucose-Capillary: 104 mg/dL — ABNORMAL HIGH (ref 70–99)
Glucose-Capillary: 187 mg/dL — ABNORMAL HIGH (ref 70–99)
Glucose-Capillary: 143 mg/dL — ABNORMAL HIGH (ref 70–99)
Glucose-Capillary: 224 mg/dL — ABNORMAL HIGH (ref 70–99)

## 2013-07-12 LAB — CBC
HCT: 25.5 % — ABNORMAL LOW (ref 36.0–46.0)
Hemoglobin: 8.4 g/dL — ABNORMAL LOW (ref 12.0–15.0)
MCH: 28.7 pg (ref 26.0–34.0)
MCHC: 32.9 g/dL (ref 30.0–36.0)
MCV: 87 fL (ref 78.0–100.0)
Platelets: 249 10*3/uL (ref 150–400)
RBC: 2.93 MIL/uL — ABNORMAL LOW (ref 3.87–5.11)
RDW: 14.5 % (ref 11.5–15.5)
WBC: 7.7 10*3/uL (ref 4.0–10.5)

## 2013-07-12 LAB — PROTIME-INR
INR: 1.27 (ref 0.00–1.49)
Prothrombin Time: 15.6 seconds — ABNORMAL HIGH (ref 11.6–15.2)

## 2013-07-12 MED ORDER — WARFARIN SODIUM 5 MG PO TABS
5.0000 mg | ORAL_TABLET | Freq: Once | ORAL | Status: AC
  Administered 2013-07-12: 5 mg via ORAL
  Filled 2013-07-12: qty 1

## 2013-07-12 NOTE — Progress Notes (Addendum)
       [address]       [place] [address]             [phone]          10 Days Post-Op Procedure(s) (LRB): CORONARY ARTERY BYPASS GRAFTING (CABG) (N/A) INTRAOPERATIVE TRANSESOPHAGEAL ECHOCARDIOGRAM (N/A)  Subjective: Feels a lot better today.  Had a BM yesterday, appetite and breathing improved.   Objective: Vital signs in last 24 hours: Patient Vitals for the past 24 hrs:  BP Temp Temp src Pulse Resp SpO2 Weight  07/12/13 0523 - - - - - - 185 lb 13.6 oz (84.3 kg)  07/12/13 0454 102/56 mmHg 98.2 F (36.8 C) Oral 80 17 95 % -  07/11/13 1443 125/69 mmHg 98.3 F (36.8 C) Oral 79 18 96 % -   Current Weight  07/12/13 185 lb 13.6 oz (84.3 kg)  Pre op weight 73 kg    Intake/Output from previous day: 05/09 0701 - 05/10 0700 In: 720 [P.O.:720] Out: 1400 [Urine:1400]  CBGs [id number]    PHYSICAL EXAM:  Heart: brady 50s under pacer Lungs: Decreased BS in bases Wound: Clean and dry Extremities: Mild LE edema    Lab Results: CBC: Recent Labs  07/10/13 0515 07/12/13 0408  WBC 9.5 7.7  HGB 8.9* 8.4*  HCT 27.0* 25.5*  PLT 268 249   BMET:  Recent Labs  07/11/13 0904 07/12/13 0408  NA 131* 133*  K 6.2* 4.7  CL 95* 96  CO2 25 29  GLUCOSE 174* 133*  BUN 67* 65*  CREATININE 1.55* 1.44*  CALCIUM 8.6 8.1*    PT/INR:  Recent Labs  07/12/13 0408  LABPROT 15.6*  INR 1.27      Assessment/Plan: S/P Procedure(s) (LRB): CORONARY ARTERY BYPASS GRAFTING (CABG) (N/A) INTRAOPERATIVE TRANSESOPHAGEAL ECHOCARDIOGRAM (N/A)  CV- Postop AF, tachy/brady. Presently paced at 80, but brady under pacer in 2s. On Sotalol. INR bumping now, will repeat Coumadin 5 mg tonight.  Hyperkalemia- significantly improved.  Will d/c Kayexalate.  Mild postop renal insufficiency- Cr stable, trending down. Watch.   DM- sugars stable on low dose Levemir. Will titrate to home dose as needed.  Not back on Metformin due to elevated creatinine.  CRPI, pulm  toilet.  Expected postop blood loss anemia- H/H down slightly, continue to monitor. Continue Fe.    LOS: 13 days    Coolidge Breeze 07/12/2013  Chart reviewed, patient examined, agree with above.  She feels better.  Sinus brady in low 50's under pacer on Sotalol. Will pace at 45 today to maximize cardiac output while diuresing.

## 2013-07-13 LAB — BASIC METABOLIC PANEL
BUN: 56 mg/dL — ABNORMAL HIGH (ref 6–23)
CO2: 29 mEq/L (ref 19–32)
Calcium: 8.4 mg/dL (ref 8.4–10.5)
Chloride: 99 mEq/L (ref 96–112)
Creatinine, Ser: 1.3 mg/dL — ABNORMAL HIGH (ref 0.50–1.10)
GFR calc Af Amer: 51 mL/min — ABNORMAL LOW (ref >90)
GFR calc non Af Amer: 44 mL/min — ABNORMAL LOW (ref >90)
Glucose, Bld: 106 mg/dL — ABNORMAL HIGH (ref 70–99)
Potassium: 4.4 mEq/L (ref 3.7–5.3)
Sodium: 139 mEq/L (ref 137–147)

## 2013-07-13 LAB — CBC
HCT: 25.4 % — ABNORMAL LOW (ref 36.0–46.0)
Hemoglobin: 8.3 g/dL — ABNORMAL LOW (ref 12.0–15.0)
MCH: 28.5 pg (ref 26.0–34.0)
MCHC: 32.7 g/dL (ref 30.0–36.0)
MCV: 87.3 fL (ref 78.0–100.0)
Platelets: 254 10*3/uL (ref 150–400)
RBC: 2.91 MIL/uL — ABNORMAL LOW (ref 3.87–5.11)
RDW: 14.5 % (ref 11.5–15.5)
WBC: 7.9 10*3/uL (ref 4.0–10.5)

## 2013-07-13 LAB — GLUCOSE, CAPILLARY
Glucose-Capillary: 103 mg/dL — ABNORMAL HIGH (ref 70–99)
Glucose-Capillary: 184 mg/dL — ABNORMAL HIGH (ref 70–99)
Glucose-Capillary: 230 mg/dL — ABNORMAL HIGH (ref 70–99)

## 2013-07-13 LAB — PROTIME-INR
INR: 1.19 (ref 0.00–1.49)
Prothrombin Time: 14.8 seconds (ref 11.6–15.2)

## 2013-07-13 MED ORDER — FUROSEMIDE 40 MG PO TABS: 40.0000 mg | ORAL_TABLET | Freq: Every day | ORAL | Status: DC

## 2013-07-13 MED ORDER — TRAZODONE 25 MG HALF TABLET: 25.0000 mg | ORAL_TABLET | Freq: Every day | ORAL | Status: DC

## 2013-07-13 MED ORDER — SOTALOL HCL 80 MG PO TABS: 80.0000 mg | ORAL_TABLET | Freq: Every day | ORAL | Status: DC

## 2013-07-13 MED ORDER — FE FUMARATE-B12-VIT C-FA-IFC PO CAPS: 1.0000 | ORAL_CAPSULE | Freq: Every morning | ORAL | Status: DC

## 2013-07-13 MED ORDER — ASPIRIN EC 325 MG PO TBEC
325.0000 mg | DELAYED_RELEASE_TABLET | Freq: Every day | ORAL | Status: DC
  Administered 2013-07-14: 325 mg via ORAL
  Filled 2013-07-13: qty 1

## 2013-07-13 MED ORDER — ASPIRIN 325 MG PO TBEC: 325.0000 mg | DELAYED_RELEASE_TABLET | Freq: Every day | ORAL | Status: DC

## 2013-07-13 NOTE — Progress Notes (Addendum)
      [address]       ,[place] [address]             [phone]        11 Days Post-Op Procedure(s) (LRB): CORONARY ARTERY BYPASS GRAFTING (CABG) (N/A) INTRAOPERATIVE TRANSESOPHAGEAL ECHOCARDIOGRAM (N/A)  Subjective: Patient without complaints.  Objective: Vital signs in last 24 hours: Temp:  [97.7 F (36.5 C)-98.4 F (36.9 C)] 98.1 F (36.7 C) (05/11 0425) Pulse Rate:  [60-81] 80 (05/11 0805) Cardiac Rhythm:  [-] Normal sinus rhythm (05/11 1115) Resp:  [17-18] 18 (05/11 0425) BP: (93-122)/(51-62) 122/57 mmHg (05/11 0800) SpO2:  [94 %-98 %] 94 % (05/11 0800) Weight:  [180 lb 1.9 oz (81.7 kg)] 180 lb 1.9 oz (81.7 kg) (05/11 0530)  Pre op weight 73 kg Current Weight  07/13/13 180 lb 1.9 oz (81.7 kg)      Intake/Output from previous day: 05/10 0701 - 05/11 0700 In: 240 [P.O.:240] Out: -    Physical Exam:  Cardiovascular: RRR Pulmonary:Slightly decreased at bases; no rales, wheezes, or rhonchi. Abdomen: Soft, non tender, bowel sounds present. Extremities: Bilateral lower extremity edema. Ecchymosis right thigh Wounds: Clean and dry.  No erythema or signs of infection.  Lab Results: CBC:  Recent Labs  07/12/13 0408 07/13/13 0505  WBC 7.7 7.9  HGB 8.4* 8.3*  HCT 25.5* 25.4*  PLT 249 254   BMET:   Recent Labs  07/12/13 0408 07/13/13 0505  NA 133* 139  K 4.7 4.4  CL 96 99  CO2 29 29  GLUCOSE 133* 106*  BUN 65* 56*  CREATININE 1.44* 1.30*  CALCIUM 8.1* 8.4    PT/INR:  Lab Results  Component Value Date   INR 1.19 07/13/2013   INR 1.27 07/12/2013   INR 1.00 07/11/2013   ABG:  INR: Will add last result for INR, ABG once components are confirmed Will add last 4 CBG results once components are confirmed  Assessment/Plan:  1. CV - S/p NSTEMI. Previous a fib with RVR. SR in the 80's On backup pacer at 50-likely disconnect.On Sotalol 80 daily and Coumadin. INR 1.19. 2.  Pulmonary - S/p left thoracenteses 5/5. 950 ml removed.  On room air.Encourage incentive spirometer and flutter valve. 3. Volume Overload - On Lasix 80 IV daily 4.  Acute blood loss anemia - H and H this am 8.3 and  25.4. Continue Trinsicon. 5. DM-CBGs 224/103/184. Pre op HGA1C 6.9. On Metformin 500 bid and Insulin pre op. On Insulin only. Will not restart Metformin as last creatinine 1.3.  6. Recheck BMET in am 7. Remove EPW and PICC in am 8. Likely discharge in am   Donielle M ZimmermanPA-C 07/13/2013,12:28 PM  Maintaining nsr Stop coumadin - compliance would be an issue Home in am  patient examined and medical record reviewed,agree with above note. Tharon Aquas Trigt 07/13/2013

## 2013-07-13 NOTE — Progress Notes (Signed)
CARDIAC REHAB PHASE I   PRE:  Rate/Rhythm: 71 SR  BP:  Sitting: 115/57      SaO2: 93 RA  MODE:  Ambulation: 300 ft   POST:  Rate/Rhythm: 88 SR  BP:  Sitting: 142/78     SaO2: 93 RA 1335-1411 Pt sitting in chair upon arrival. Unable to stand with multiple attempts. Stated her legs and buttocks were asleep. Achieved standing position after 2 attempts with help from RN and gait belt. Once standing, Pt ambulated with slow steady gait x 1 assist. Pt stated she ambulated this am with primary RN without walker. Pt also stated that she has no place for a walker at home r/t size of apartment. Denied complaints during ambulation. Post walk, pt back to chair with call bell in reach and husband at bedside.  Mulki Roesler English PayneRN, BSN 07/13/2013 2:14 PM

## 2013-07-14 DIAGNOSIS — Z0271 Encounter for disability determination: Secondary | ICD-10-CM

## 2013-07-14 LAB — GLUCOSE, CAPILLARY
Glucose-Capillary: 138 mg/dL — ABNORMAL HIGH (ref 70–99)
Glucose-Capillary: 107 mg/dL — ABNORMAL HIGH (ref 70–99)
Glucose-Capillary: 153 mg/dL — ABNORMAL HIGH (ref 70–99)

## 2013-07-14 MED ORDER — METFORMIN HCL 500 MG PO TABS: 500.0000 mg | ORAL_TABLET | Freq: Two times a day (BID) | ORAL | Status: DC

## 2013-07-14 MED ORDER — FUROSEMIDE 40 MG PO TABS
40.0000 mg | ORAL_TABLET | Freq: Every day | ORAL | Status: DC
  Administered 2013-07-14: 40 mg via ORAL
  Filled 2013-07-14: qty 1

## 2013-07-14 NOTE — Significant Event (Signed)
Reviewed discharge instructions with patient and family at the bedside. Patient given a copy of it, along with prescription papers, paperwork for follow-up with [facility]. Patient verbalized back of all her follow up appointments. Patient given her free scale as well. All questions answered. Patient and family verbalized understanding. All personal belongings with patient to take home. Will await until after lunch to discharge patient. Kenzley Ke, Therapist, sports.

## 2013-07-14 NOTE — Progress Notes (Signed)
Discussed with pt and husband ed. Voiced understanding. Ready to make diet change. Interested in [facility] and will send referral to [facility]. Pt needs RW for home. Walked a few feet in hall and she is unsteady without it.  [place], ACSM 9:09 AM 07/14/2013

## 2013-07-14 NOTE — Progress Notes (Signed)
      [address]       [place] [address]             [phone]        12 Days Post-Op Procedure(s) (LRB): CORONARY ARTERY BYPASS GRAFTING (CABG) (N/A) INTRAOPERATIVE TRANSESOPHAGEAL ECHOCARDIOGRAM (N/A)  Subjective: Patient without complaints and is looking forward to going home.  Objective: Vital signs in last 24 hours: Temp:  [97.4 F (36.3 C)-99.1 F (37.3 C)] 97.4 F (36.3 C) (05/12 0647) Pulse Rate:  [60-80] 67 (05/12 0647) Cardiac Rhythm:  [-] Normal sinus rhythm (05/11 2004) Resp:  [18-20] 18 (05/12 0647) BP: (122-157)/(57-77) 136/77 mmHg (05/12 0647) SpO2:  [92 %-97 %] 97 % (05/12 0647) Weight:  [178 lb 2.1 oz (80.8 kg)] 178 lb 2.1 oz (80.8 kg) (05/12 0647)  Pre op weight 73 kg Current Weight  07/14/13 178 lb 2.1 oz (80.8 kg)      Intake/Output from previous day: 05/11 0701 - 05/12 0700 In: 480 [P.O.:480] Out: 700 [Urine:700]   Physical Exam:  Cardiovascular: RRR Pulmonary:Mostly clear; no rales, wheezes, or rhonchi. Abdomen: Soft, non tender, bowel sounds present. Extremities: Mild bilateral lower extremity edema R>L Ecchymosis right thigh Wounds: Clean and dry.  No erythema or signs of infection.  Lab Results: CBC:  Recent Labs  07/12/13 0408 07/13/13 0505  WBC 7.7 7.9  HGB 8.4* 8.3*  HCT 25.5* 25.4*  PLT 249 254   BMET:   Recent Labs  07/12/13 0408 07/13/13 0505  NA 133* 139  K 4.7 4.4  CL 96 99  CO2 29 29  GLUCOSE 133* 106*  BUN 65* 56*  CREATININE 1.44* 1.30*  CALCIUM 8.1* 8.4    PT/INR:  Lab Results  Component Value Date   INR 1.19 07/13/2013   INR 1.27 07/12/2013   INR 1.00 07/11/2013   ABG:  INR: Will add last result for INR, ABG once components are confirmed Will add last 4 CBG results once components are confirmed  Assessment/Plan:  1. CV - S/p NSTEMI. Previous a fib with RVR. SR in the 60's.On Sotalol 80 daily. Unable to start ACE/ARB as previously elevated renal function. Will discuss  Sotalol dose with surgeon. 2.  Pulmonary - S/p left thoracenteses 5/5. 950 ml removed. On room air.Encourage incentive spirometer and flutter valve. 3. Volume Overload - On Lasix 80 IV daily. Will change to 40 PO daily and continue for 10 days as outpatient. 4.  Acute blood loss anemia - H and H this am 8.3 and  25.4. Continue Trinsicon. 5. DM-CBGs 230/138/107. Pre op HGA1C 6.9. On Metformin 500 bid and Insulin pre op. On Insulin now only as creatinine has been elevated. Will restart Metformin in am. 6. Remove EPW and PICC in am 7. Discharge     Donielle M ZimmermanPA-C 07/14/2013,7:24 AM

## 2013-07-14 NOTE — Significant Event (Addendum)
Epicardial wires removed per order following protocol using sterile techniques. End tips of epicardial wires are assessed and there are no blood or tissues noted on them. Sites covered with sterile gauze, sterile dry gauze. Old CT sutures removed per order and steri-strips placed on it. Patient tolerated procedure well. VS stable. Patient is bedrest. Will continue to monitor. Gloris Shiroma, Therapist, sports.

## 2013-07-14 NOTE — Discharge Summary (Signed)
patient examined and medical record reviewed,agree with above note. Tharon Aquas Trigt 07/14/2013

## 2013-07-14 NOTE — Progress Notes (Signed)
Sent wish scales form to [facility] for nse to give to pt/fam. They can get free scales at [facility] w wish scale form.

## 2013-07-15 ENCOUNTER — Other Ambulatory Visit: Payer: Self-pay

## 2013-07-15 MED ORDER — LEVOTHYROXINE SODIUM 50 MCG PO TABS: 50.0000 ug | ORAL_TABLET | Freq: Every day | ORAL | Status: DC

## 2013-07-17 ENCOUNTER — Telehealth: Payer: Self-pay | Admitting: Adult Health

## 2013-07-17 ENCOUNTER — Other Ambulatory Visit: Payer: Self-pay | Admitting: Adult Health

## 2013-07-17 MED ORDER — ESCITALOPRAM OXALATE 10 MG PO TABS: ORAL_TABLET | ORAL | Status: DC

## 2013-07-17 NOTE — Telephone Encounter (Signed)
Please advise 

## 2013-07-17 NOTE — Telephone Encounter (Signed)
For depression start Lexapro 10 mg in the evening. This may make her sleepy.

## 2013-07-17 NOTE — Telephone Encounter (Signed)
Was she prescribed any antibiotic before leaving the hospital?  When did the diarrhea start? Other symptoms?

## 2013-07-17 NOTE — Telephone Encounter (Signed)
Patient was notified of Raquel's comments. Patient states She had one episode of diarrhea at 11am today and currently has nasal congestion. Patient stated she was NOT prescribed any antibiotics for leaving the hospital.

## 2013-07-17 NOTE — Telephone Encounter (Signed)
Patient Information:  Caller Name: Freda Munro  Phone: [phone]  Patient: Jamie Arias, Jamie Arias  Gender: Female  DOB: 01-15-1956  Age: 58 Years  PCP: Charolette Forward  Office Follow Up:  Does the office need to follow up with this patient?: Yes  Instructions For The Office: Please call pt at her home # [phone] and let her know if something can be called in. Explained that it would be unlikely w/out physical assessment. Discussed OTC Immodium with [place] RN.  RN Note:  Home health care RN, Freda Munro is calling for pt asking if something can be prescribed for pt for mild depression and insomnia following surgery. Pt states she is having trouble with insurance and declines appt.  Symptoms  Reason For Call & Symptoms: Diarrhea, insomnia and depression since releas from hospital on 07/14/13: CABG x 3. Pt takes 2 Trazadone at hs: not helping. Has only had Diarrhea x 3 starting today, 07/17/13.  Reviewed Health History In EMR: Yes  Reviewed Medications In EMR: Yes  Reviewed Allergies In EMR: Yes  Reviewed Surgeries / Procedures: Yes  Date of Onset of Symptoms: 07/14/2013  Guideline(s) Used:  Diarrhea  Depression  Disposition Per Guideline:   Home Care  Reason For Disposition Reached:   Mild depression  Advice Given:  N/A  Patient Will Follow Care Advice:  YES

## 2013-07-18 ENCOUNTER — Inpatient Hospital Stay: Payer: Self-pay | Admitting: Internal Medicine

## 2013-07-18 DIAGNOSIS — I2510 Atherosclerotic heart disease of native coronary artery without angina pectoris: Secondary | ICD-10-CM

## 2013-07-18 DIAGNOSIS — I10 Essential (primary) hypertension: Secondary | ICD-10-CM

## 2013-07-18 DIAGNOSIS — R0602 Shortness of breath: Secondary | ICD-10-CM

## 2013-07-18 DIAGNOSIS — R112 Nausea with vomiting, unspecified: Secondary | ICD-10-CM

## 2013-07-18 LAB — CBC
HCT: 32 % — ABNORMAL LOW (ref 35.0–47.0)
HGB: 10.5 g/dL — ABNORMAL LOW (ref 12.0–16.0)
MCH: 28.4 pg (ref 26.0–34.0)
MCHC: 32.8 g/dL (ref 32.0–36.0)
MCV: 87 fL (ref 80–100)
Platelet: 271 10*3/uL (ref 150–440)
RBC: 3.69 10*6/uL — ABNORMAL LOW (ref 3.80–5.20)
RDW: 15 % — ABNORMAL HIGH (ref 11.5–14.5)
WBC: 7.6 10*3/uL (ref 3.6–11.0)

## 2013-07-18 LAB — BASIC METABOLIC PANEL
Anion Gap: 7 (ref 7–16)
BUN: 25 mg/dL — ABNORMAL HIGH (ref 7–18)
Calcium, Total: 9.1 mg/dL (ref 8.5–10.1)
Chloride: 104 mmol/L (ref 98–107)
Co2: 28 mmol/L (ref 21–32)
Creatinine: 1.17 mg/dL (ref 0.60–1.30)
EGFR (African American): 59 — ABNORMAL LOW
EGFR (Non-African Amer.): 51 — ABNORMAL LOW
Glucose: 176 mg/dL — ABNORMAL HIGH (ref 65–99)
Osmolality: 286 (ref 275–301)
Potassium: 3.8 mmol/L (ref 3.5–5.1)
Sodium: 139 mmol/L (ref 136–145)

## 2013-07-18 LAB — PRO B NATRIURETIC PEPTIDE: B-Type Natriuretic Peptide: 13833 pg/mL — ABNORMAL HIGH (ref 0–125)

## 2013-07-18 LAB — CK TOTAL AND CKMB (NOT AT ARMC)
CK, Total: 93 U/L
CK, Total: 73 U/L
CK, Total: 81 U/L
CK-MB: 2.3 ng/mL (ref 0.5–3.6)
CK-MB: 2 ng/mL (ref 0.5–3.6)
CK-MB: 2 ng/mL (ref 0.5–3.6)

## 2013-07-18 LAB — TROPONIN I
Troponin-I: 0.08 ng/mL — ABNORMAL HIGH
Troponin-I: 0.07 ng/mL — ABNORMAL HIGH

## 2013-07-19 DIAGNOSIS — J90 Pleural effusion, not elsewhere classified: Secondary | ICD-10-CM

## 2013-07-19 LAB — CBC WITH DIFFERENTIAL/PLATELET
Basophil #: 0.1 10*3/uL (ref 0.0–0.1)
Basophil %: 0.7 %
Eosinophil #: 0 10*3/uL (ref 0.0–0.7)
Eosinophil %: 0.2 %
HCT: 27 % — ABNORMAL LOW (ref 35.0–47.0)
HGB: 9 g/dL — ABNORMAL LOW (ref 12.0–16.0)
Lymphocyte #: 1.1 10*3/uL (ref 1.0–3.6)
Lymphocyte %: 15.2 %
MCH: 28.6 pg (ref 26.0–34.0)
MCHC: 33.2 g/dL (ref 32.0–36.0)
MCV: 86 fL (ref 80–100)
Monocyte #: 0.5 x10 3/mm (ref 0.2–0.9)
Monocyte %: 7 %
Neutrophil #: 5.7 10*3/uL (ref 1.4–6.5)
Neutrophil %: 76.9 %
Platelet: 258 10*3/uL (ref 150–440)
RBC: 3.14 10*6/uL — ABNORMAL LOW (ref 3.80–5.20)
RDW: 14.6 % — ABNORMAL HIGH (ref 11.5–14.5)
WBC: 7.4 10*3/uL (ref 3.6–11.0)

## 2013-07-19 LAB — MAGNESIUM: Magnesium: 1.7 mg/dL — ABNORMAL LOW

## 2013-07-19 LAB — BASIC METABOLIC PANEL
Anion Gap: 5 — ABNORMAL LOW (ref 7–16)
BUN: 27 mg/dL — ABNORMAL HIGH (ref 7–18)
Calcium, Total: 8.6 mg/dL (ref 8.5–10.1)
Chloride: 103 mmol/L (ref 98–107)
Co2: 32 mmol/L (ref 21–32)
Creatinine: 1.38 mg/dL — ABNORMAL HIGH (ref 0.60–1.30)
EGFR (African American): 48 — ABNORMAL LOW
EGFR (Non-African Amer.): 42 — ABNORMAL LOW
Glucose: 128 mg/dL — ABNORMAL HIGH (ref 65–99)
Osmolality: 286 (ref 275–301)
Potassium: 3.7 mmol/L (ref 3.5–5.1)
Sodium: 140 mmol/L (ref 136–145)

## 2013-07-19 LAB — PRO B NATRIURETIC PEPTIDE: B-Type Natriuretic Peptide: 33822 pg/mL — ABNORMAL HIGH (ref 0–125)

## 2013-07-20 LAB — BASIC METABOLIC PANEL
Anion Gap: 2 — ABNORMAL LOW (ref 7–16)
BUN: 35 mg/dL — ABNORMAL HIGH (ref 7–18)
Calcium, Total: 8.7 mg/dL (ref 8.5–10.1)
Chloride: 100 mmol/L (ref 98–107)
Co2: 34 mmol/L — ABNORMAL HIGH (ref 21–32)
Creatinine: 1.29 mg/dL (ref 0.60–1.30)
EGFR (African American): 52 — ABNORMAL LOW
EGFR (Non-African Amer.): 45 — ABNORMAL LOW
Glucose: 107 mg/dL — ABNORMAL HIGH (ref 65–99)
Osmolality: 280 (ref 275–301)
Potassium: 4.4 mmol/L (ref 3.5–5.1)
Sodium: 136 mmol/L (ref 136–145)

## 2013-07-22 DIAGNOSIS — Z48812 Encounter for surgical aftercare following surgery on the circulatory system: Secondary | ICD-10-CM

## 2013-07-22 DIAGNOSIS — I2510 Atherosclerotic heart disease of native coronary artery without angina pectoris: Secondary | ICD-10-CM

## 2013-07-23 ENCOUNTER — Emergency Department: Payer: Self-pay | Admitting: Emergency Medicine

## 2013-07-23 LAB — COMPREHENSIVE METABOLIC PANEL WITH GFR
Albumin: 2.7 g/dL — ABNORMAL LOW
Alkaline Phosphatase: 145 U/L — ABNORMAL HIGH
Anion Gap: 10
BUN: 22 mg/dL — ABNORMAL HIGH
Bilirubin,Total: 0.7 mg/dL
Calcium, Total: 9.1 mg/dL
Chloride: 101 mmol/L
Co2: 28 mmol/L
Creatinine: 1.02 mg/dL
EGFR (African American): >60
EGFR (Non-African Amer.): >60
Glucose: 182 mg/dL — ABNORMAL HIGH
Osmolality: 286
Potassium: 3.8 mmol/L
SGOT(AST): 16 U/L
SGPT (ALT): 15 U/L
Sodium: 139 mmol/L
Total Protein: 7 g/dL

## 2013-07-23 LAB — URINALYSIS, COMPLETE
Bilirubin,UR: NEGATIVE
Blood: NEGATIVE
Glucose,UR: NEGATIVE mg/dL
Leukocyte Esterase: NEGATIVE
Nitrite: NEGATIVE
Ph: 8
Protein: 100
RBC,UR: 2 /HPF
Specific Gravity: 1.016
Squamous Epithelial: 1
WBC UR: <1 /HPF

## 2013-07-23 LAB — CBC
HCT: 31.6 % — ABNORMAL LOW (ref 35.0–47.0)
HGB: 10.5 g/dL — ABNORMAL LOW (ref 12.0–16.0)
MCH: 28.5 pg (ref 26.0–34.0)
MCHC: 33.1 g/dL (ref 32.0–36.0)
MCV: 86 fL (ref 80–100)
Platelet: 275 10*3/uL (ref 150–440)
RBC: 3.67 10*6/uL — ABNORMAL LOW (ref 3.80–5.20)
RDW: 15 % — ABNORMAL HIGH (ref 11.5–14.5)
WBC: 5 10*3/uL (ref 3.6–11.0)

## 2013-07-23 LAB — TROPONIN I: Troponin-I: 0.03 ng/mL

## 2013-07-23 LAB — LIPASE, BLOOD: Lipase: 107 U/L (ref 73–393)

## 2013-07-24 NOTE — Discharge Summary (Signed)
patient examined and medical record reviewed,agree with above note. Tharon Aquas Trigt 07/24/2013

## 2013-07-30 ENCOUNTER — Encounter: Payer: Self-pay | Admitting: Cardiovascular Disease

## 2013-07-30 ENCOUNTER — Ambulatory Visit (INDEPENDENT_AMBULATORY_CARE_PROVIDER_SITE_OTHER): Payer: Self-pay | Admitting: Cardiovascular Disease

## 2013-07-30 VITALS — BP 137/81 | HR 74 | Ht 64.0 in | Wt 158.0 lb

## 2013-07-30 DIAGNOSIS — I4891 Unspecified atrial fibrillation: Secondary | ICD-10-CM

## 2013-07-30 DIAGNOSIS — I482 Chronic atrial fibrillation, unspecified: Secondary | ICD-10-CM | POA: Insufficient documentation

## 2013-07-30 DIAGNOSIS — I10 Essential (primary) hypertension: Secondary | ICD-10-CM

## 2013-07-30 DIAGNOSIS — I5022 Chronic systolic (congestive) heart failure: Secondary | ICD-10-CM | POA: Insufficient documentation

## 2013-07-30 DIAGNOSIS — I519 Heart disease, unspecified: Secondary | ICD-10-CM

## 2013-07-30 DIAGNOSIS — I2510 Atherosclerotic heart disease of native coronary artery without angina pectoris: Secondary | ICD-10-CM

## 2013-07-30 DIAGNOSIS — E785 Hyperlipidemia, unspecified: Secondary | ICD-10-CM

## 2013-07-30 DIAGNOSIS — I9789 Other postprocedural complications and disorders of the circulatory system, not elsewhere classified: Secondary | ICD-10-CM

## 2013-07-30 MED ORDER — METOPROLOL TARTRATE 25 MG PO TABS: 25.0000 mg | ORAL_TABLET | Freq: Two times a day (BID) | ORAL | Status: DC

## 2013-07-30 MED ORDER — NITROGLYCERIN 0.4 MG SL SUBL: 0.4000 mg | SUBLINGUAL_TABLET | SUBLINGUAL | Status: DC | PRN

## 2013-07-30 MED ORDER — FUROSEMIDE 20 MG PO TABS: 20.0000 mg | ORAL_TABLET | Freq: Every day | ORAL | Status: DC

## 2013-07-30 NOTE — Assessment & Plan Note (Signed)
Blood pressure is well controlled on current medications. 

## 2013-07-30 NOTE — Progress Notes (Signed)
HPI  This is a 58 year old female who is here today for followup regarding coronary artery disease status post CABG. She has known history of diabetes, hypertension and hyperlipidemia. She presented in April of this year with non-ST elevation myocardial infarction with late presentation. Ejection fraction was 35% with multiple wall motion abnormalities and moderate mitral regurgitation. She underwent cardiac catheterization which showed severe three-vessel coronary artery disease. She underwent CABG at [facility]. She had postoperative hypotension and atrial fibrillation. She was treated with amiodarone but had persistent nausea. She was switched to sotalol. She was discharged home but presented back to [facility] on May 16 with worsening dyspnea, leg edema and weight gain. Chest x-ray showed worsening pleural effusion on the left side. She ruled out for myocardial infarction. She underwent left-sided thoracentesis with removal of 600 cc of serosanguineous fluid. She was given IV Lasix and then transitioned back to oral. She has been doing better since hospital discharge was improved dyspnea. Orthopnea resolved completely. Weight is back to baseline. No chest pain.  No Known Allergies   Current Outpatient Prescriptions on File Prior to Visit  Medication Sig Dispense Refill  . aspirin EC 325 MG EC tablet Take 1 tablet (325 mg total) by mouth daily.  30 tablet  0  . escitalopram (LEXAPRO) 10 MG tablet Take 1 tablet in the evening.  30 tablet  3  . ferrous Q000111Q C-folic acid (TRINSICON / FOLTRIN) capsule Take 1 capsule by mouth every morning. For one month then stop.  30 capsule  0  . gabapentin (NEURONTIN) 300 MG capsule Take 1 capsule (300 mg total) by mouth 3 (three) times daily.  90 capsule  3  . glucose blood test strip Please dispense Contour next EZ test strips. She is testing 4 times a day. Diagnosis code 250.0  120 each  12  . insulin detemir (LEVEMIR) 100 UNIT/ML injection Inject 12 Units  into the skin at bedtime.      . Insulin Syringe-Needle U-100 (INSULIN SYRINGE .5CC/30GX1/2") 30G X 1/2" 0.5 ML MISC Use as directed  100 each  5  . Lancets (FREESTYLE) lancets USE TO TEST BLOOD SUGAR FOUR TIMES DAILY  100 each  5  . levothyroxine (SYNTHROID, LEVOTHROID) 50 MCG tablet Take 1 tablet (50 mcg total) by mouth daily.  30 tablet  3  . metFORMIN (GLUCOPHAGE) 500 MG tablet Take 1 tablet (500 mg total) by mouth 2 (two) times daily with a meal.      . simvastatin (ZOCOR) 10 MG tablet Take 1 tablet (10 mg total) by mouth at bedtime.  30 tablet  3  . traZODone (DESYREL) 25 mg TABS tablet Take 0.5 tablets (25 mg total) by mouth at bedtime.  30 tablet  6   No current facility-administered medications on file prior to visit.     Past Medical History  Diagnosis Date  . Diabetes mellitus without complication   . Hypertension   . Hyperlipidemia   . Hypothyroidism   . Diabetic neuropathy   . Coronary artery disease     Non-ST elevation myocardial infarction in April of 2015. Cardiac catheterization showed severe three-vessel coronary artery disease with ejection fraction of30% and mild to moderate mitral regurgitation. [facility] with LIMA to LAD, SVG to OM and SVG to right PDA.  Marland Kitchen Chronic systolic heart failure     Due to ischemic cardiomyopathy. Ejection fraction was 35% and improved to normal after CABG  . Anemia      Past Surgical History  Procedure Laterality Date  . Eye surgery      Cataract both eyes  . Coronary artery bypass graft N/A 07/02/2013    Procedure: CORONARY ARTERY BYPASS GRAFTING (CABG);  Surgeon: Ivin [name], MD;  Location: [place];  Service: Open Heart Surgery;  Laterality: N/A;  CABG x three, using left internal mammary artery and right leg greater saphenous vein harvested endoscopically  . Intraoperative transesophageal echocardiogram N/A 07/02/2013    Procedure: INTRAOPERATIVE TRANSESOPHAGEAL ECHOCARDIOGRAM;  Surgeon: Ivin [name], MD;   Location: [place];  Service: Open Heart Surgery;  Laterality: N/A;     Family History  Problem Relation Age of Onset  . COPD Mother   . Cancer Mother     Lung  . Pulmonary embolism Father      History   Social History  . Marital Status: Married    Spouse Name: N/A    Number of Children: N/A  . Years of Education: N/A   Occupational History  . Not on file.   Social History Main Topics  . Smoking status: Never Smoker   . Smokeless tobacco: Never Used  . Alcohol Use: No  . Drug Use: No  . Sexual Activity: Not on file   Other Topics Concern  . Not on file   Social History Narrative   Patient lives at home with her husband. Patient has 4 adult children.     PHYSICAL EXAM   BP 137/81  Pulse 74  Ht 5\' 4"  (1.626 m)  Wt 158 lb (71.668 kg)  BMI 27.11 kg/m2 Constitutional: She is oriented to person, place, and time. She appears well-developed and well-nourished. No distress.  HENT: No nasal discharge.  Head: Normocephalic and atraumatic.  Eyes: Pupils are equal and round. No discharge.  Neck: Normal range of motion. Neck supple. No JVD present. No thyromegaly present.  Cardiovascular: Normal rate, regular rhythm, normal heart sounds. Exam reveals no gallop and no friction rub. No murmur heard.  Pulmonary/Chest: Effort normal and diminished breath sounds at the left base. No stridor. No respiratory distress. She has no wheezes. She has no rales. She exhibits no tenderness.  Abdominal: Soft. Bowel sounds are normal. She exhibits no distension. There is no tenderness. There is no rebound and no guarding.  Musculoskeletal: Normal range of motion. She exhibits no edema and no tenderness.  Neurological: She is alert and oriented to person, place, and time. Coordination normal.  Skin: Skin is warm and dry. No rash noted. She is not diaphoretic. No erythema. No pallor.  Psychiatric: She has a normal mood and affect. Her behavior is normal. Judgment and thought content normal.      [id number]  Rhythm  Low voltage in precordial leads.   -Poor R-wave progression -may be secondary to pulmonary disease   consider old anterior infarct.   -  Diffuse nonspecific T-abnormality.   ABNORMAL    ASSESSMENT AND PLAN

## 2013-07-30 NOTE — Assessment & Plan Note (Signed)
I switched to sotalol to metoprolol 25 mg twice daily.

## 2013-07-30 NOTE — Patient Instructions (Addendum)
Your physician has recommended you make the following change in your medication:  Decrease Lasix to 20 mg once daily  Stop Sotalol  Start Metoprolol 25 mg twice daily  Start SL Nitro. Place one tablet under tongue every 5 minutes x 3    Your physician recommends that you schedule a follow-up appointment in:  3 months

## 2013-07-30 NOTE — Assessment & Plan Note (Signed)
She is doing reasonably well after her recent non-ST elevation myocardial infarction. She underwent successful CABG for severe three-vessel coronary artery disease. Continue medical therapy. I will refer her to cardiac rehabilitation.

## 2013-07-30 NOTE — Assessment & Plan Note (Signed)
Continue treatment with simvastatin. However, a most likely, more within statin would be needed in order to achieve an LDL of less than 70.

## 2013-07-30 NOTE — Assessment & Plan Note (Signed)
By physical exam, she continues to have a left-sided pleural effusion. Thus, continue Lasix but at a smaller dose of 20 mg once daily. She will likely require a followup chest x-ray.

## 2013-08-04 ENCOUNTER — Telehealth: Payer: Self-pay | Admitting: *Deleted

## 2013-08-04 NOTE — Telephone Encounter (Signed)
Cardiac rehab orders sent 08/04/13

## 2013-08-05 ENCOUNTER — Ambulatory Visit: Payer: Self-pay | Admitting: Cardiothoracic Surgery

## 2013-08-05 ENCOUNTER — Telehealth: Payer: Self-pay

## 2013-08-05 ENCOUNTER — Other Ambulatory Visit: Payer: Self-pay | Admitting: Cardiothoracic Surgery

## 2013-08-05 DIAGNOSIS — I5022 Chronic systolic (congestive) heart failure: Secondary | ICD-10-CM

## 2013-08-05 NOTE — Telephone Encounter (Signed)
Pt husband called asking about [employer] papers that were supposed to be sent . Please call.

## 2013-08-06 ENCOUNTER — Ambulatory Visit: Payer: Self-pay | Admitting: Adult Health

## 2013-08-06 NOTE — Telephone Encounter (Signed)
LVM 6/4

## 2013-08-06 NOTE — Telephone Encounter (Signed)
Informed patient that we did not receive paper work  Asked them to re fax Patient verbalized understanding

## 2013-08-07 ENCOUNTER — Ambulatory Visit: Payer: No Typology Code available for payment source | Admitting: Cardiothoracic Surgery

## 2013-08-10 ENCOUNTER — Ambulatory Visit (INDEPENDENT_AMBULATORY_CARE_PROVIDER_SITE_OTHER): Payer: No Typology Code available for payment source | Admitting: Adult Health

## 2013-08-10 ENCOUNTER — Ambulatory Visit
Admission: RE | Admit: 2013-08-10 | Discharge: 2013-08-10 | Disposition: A | Discharge status: Home / Self Care | Payer: No Typology Code available for payment source | Source: Ambulatory Visit | Attending: Cardiothoracic Surgery | Admitting: Cardiothoracic Surgery

## 2013-08-10 ENCOUNTER — Ambulatory Visit (INDEPENDENT_AMBULATORY_CARE_PROVIDER_SITE_OTHER): Payer: Self-pay | Admitting: Surgical

## 2013-08-10 ENCOUNTER — Encounter: Payer: Self-pay | Admitting: Adult Health

## 2013-08-10 VITALS — BP 122/79 | HR 76 | Resp 20 | Ht 64.0 in | Wt 157.0 lb

## 2013-08-10 VITALS — BP 134/80 | HR 71 | Temp 98.1°F | Resp 14 | Wt 157.5 lb

## 2013-08-10 DIAGNOSIS — I5022 Chronic systolic (congestive) heart failure: Secondary | ICD-10-CM

## 2013-08-10 DIAGNOSIS — I2510 Atherosclerotic heart disease of native coronary artery without angina pectoris: Secondary | ICD-10-CM

## 2013-08-10 DIAGNOSIS — D649 Anemia, unspecified: Secondary | ICD-10-CM

## 2013-08-10 DIAGNOSIS — I10 Essential (primary) hypertension: Secondary | ICD-10-CM

## 2013-08-10 DIAGNOSIS — E785 Hyperlipidemia, unspecified: Secondary | ICD-10-CM

## 2013-08-10 DIAGNOSIS — R790 Abnormal level of blood mineral: Secondary | ICD-10-CM

## 2013-08-10 DIAGNOSIS — K5900 Constipation, unspecified: Secondary | ICD-10-CM | POA: Insufficient documentation

## 2013-08-10 DIAGNOSIS — E119 Type 2 diabetes mellitus without complications: Secondary | ICD-10-CM

## 2013-08-10 DIAGNOSIS — Z951 Presence of aortocoronary bypass graft: Secondary | ICD-10-CM

## 2013-08-10 DIAGNOSIS — D638 Anemia in other chronic diseases classified elsewhere: Secondary | ICD-10-CM | POA: Insufficient documentation

## 2013-08-10 NOTE — Progress Notes (Signed)
[address]       [place] [address]             [phone]                  P[facility] [place] Medical Record [id number] Date of Birth: May 28, 1955  Referring [id number], Mertie Clause, MD Primary Cardiology: Primary Care:Rey,Raquel, NP  Chief Complaint:  Follow Up Visit   History of Present Illness:       The patient is seen in routine office visit followup status post coronary artery bypass grafting x3 on 07/02/2013 for unstable angina with severe three-vessel coronary artery disease. Currently she feels as though she is doing well. She is having no pain. She denies fevers, chills or other constitutional symptoms. She is not having any difficulties with shortness of breath or chest pain. She is having no difficulty with her incisions.       Zubrod Score: At the time of surgery this patient's most appropriate activity status/level should be described as: [x]     0    Normal activity, no symptoms []     1    Restricted in physical strenuous activity but ambulatory, able to do out light work []     2    Ambulatory and capable of self care, unable to do work activities, up and about                 >50 % of waking hours                                                                                   []     3    Only limited self care, in bed greater than 50% of waking hours []     4    Completely disabled, no self care, confined to bed or chair []     5    Moribund  History  Smoking status  . Never Smoker   Smokeless tobacco  . Never Used       No Known Allergies  Current Outpatient Prescriptions  Medication Sig Dispense Refill  . aspirin EC 325 MG EC tablet Take 1 tablet (325 mg total) by mouth daily.  30 tablet  0  . ferrous Q000111Q C-folic acid (TRINSICON / FOLTRIN) capsule Take 1 capsule by mouth every morning. For one month then stop.  30 capsule  0  . furosemide (LASIX) 20 MG tablet Take 1 tablet (20 mg total) by mouth daily.   90 tablet  3  . gabapentin (NEURONTIN) 300 MG capsule Take 1 capsule (300 mg total) by mouth 3 (three) times daily.  90 capsule  3  . glucose blood test strip Please dispense Contour next EZ test strips. She is testing 4 times a day. Diagnosis code 250.0  120 each  12  . insulin detemir (LEVEMIR) 100 UNIT/ML injection Inject 12 Units into the skin at bedtime.      . Insulin Syringe-Needle U-100 (INSULIN SYRINGE .5CC/30GX1/2") 30G X 1/2" 0.5 ML MISC Use as directed  100 each  5  . Lancets (FREESTYLE) lancets USE TO TEST BLOOD SUGAR FOUR TIMES DAILY  100 each  5  . levothyroxine (SYNTHROID, LEVOTHROID) 50 MCG tablet Take 1 tablet (50 mcg total) by mouth daily.  30 tablet  3  . lisinopril (PRINIVIL,ZESTRIL) 40 MG tablet Take 40 mg by mouth daily.       . metFORMIN (GLUCOPHAGE) 500 MG tablet Take 1 tablet (500 mg total) by mouth 2 (two) times daily with a meal.      . metoprolol tartrate (LOPRESSOR) 25 MG tablet Take 1 tablet (25 mg total) by mouth 2 (two) times daily.  180 tablet  3  . nitroGLYCERIN (NITROSTAT) 0.4 MG SL tablet Place 1 tablet (0.4 mg total) under the tongue every 5 (five) minutes as needed for chest pain.  25 tablet  3  . simvastatin (ZOCOR) 10 MG tablet Take 1 tablet (10 mg total) by mouth at bedtime.  30 tablet  3  . traZODone (DESYREL) 25 mg TABS tablet Take 0.5 tablets (25 mg total) by mouth at bedtime.  30 tablet  6   No current facility-administered medications for this visit.       Physical Exam: BP 122/79  Pulse 76  Resp 20  Ht 5\' 4"  (1.626 m)  Wt 157 lb (71.215 kg)  BMI 26.94 kg/m2  SpO2 97%  General appearance: alert, cooperative and no distress Heart: regular rate and rhythm Lungs: dim in bases Abdomen: benign Extremities: trace edema Wound: incis healing well without signs of infection :  Diagnostic Studies & Laboratory data:         Recent Radiology Findings: Dg Chest 2 View  08/10/2013   CLINICAL DATA:  Chronic systolic heart failure. History of  CABG 07/02/2013.  EXAM: CHEST  2 VIEW  COMPARISON:  Two-view chest radiograph 07/21/2013. CT chest 07/19/2013.  FINDINGS: Stable cardiomegaly and stable mediastinal contours, status post median sternotomy for CABG. Pulmonary vascularity is within normal limits.  Small bilateral pleural effusions, left greater than right. The right pleural effusion appears slightly larger on today's chest radiograph compared to 07/21/2013. No significant change in the left pleural effusion. Bibasilar atelectasis. Upper lung fields are clear. Negative for edema or pneumothorax. No acute osseous abnormality.  IMPRESSION: Small bilateral pleural effusions, left greater than right, with bibasilar atelectasis.   Electronically Signed   By: Curlene Dolphin M.D.   On: 08/10/2013 13:42      Recent Labs: Lab Results  Component Value Date   WBC 7.9 07/13/2013   HGB 8.3* 07/13/2013   HCT 25.4* 07/13/2013   PLT 254 07/13/2013   GLUCOSE 106* 07/13/2013   CHOL 220* 06/30/2013   TRIG 87 06/30/2013   HDL 80 06/30/2013   LDLDIRECT 126.9 10/31/2012   LDLCALC 123* 06/30/2013   ALT 14 07/01/2013   AST 25 07/01/2013   NA 139 07/13/2013   K 4.4 07/13/2013   CL 99 07/13/2013   CREATININE 1.30* 07/13/2013   BUN 56* 07/13/2013   CO2 29 07/13/2013   TSH 4.380 06/29/2013   INR 1.19 07/13/2013   HGBA1C 6.9* 06/29/2013      Assessment / Plan:  The patient is overall doing well. She does have small pleural effusions. She was seen last week by her cardiologist Dr. Fletcher Anon who did start her on 20 mg of Lasix daily. She will continue this at his discretion. She does not appear to need any surgical intervention at this time and we will see her again on a when necessary basis for any surgically related issues or at request.  [place] 08/10/2013 2:16 PM

## 2013-08-10 NOTE — Progress Notes (Signed)
[name], female   DOB: 01/25/1956, 58 y.o.   MRN: [id number]   Subjective:    [name], female    DOB: Jul 14, 1955, 58 y.o.   MRN: [id number]  HPI  [name] is a very pleasant 58 y/o female who presents to clinic for the first time after her CABG x 3 on July 02, 2013 following MI. She is also here for follow up of chronic problems including diabetes type 2, HLD, HTN. She will have labs to follow up on some abnormal readings during hospitalization.  She reports that she is feeling wonderful. Shortly after being discharged from the hospital, [name] called requesting that I send in a prescription for medication for depression as she felt that this was a life-altering event and she was feeling low. I sent in a prescription for lexapro. She reports only taking medication 1 time. She is feeling in good spirits. Feels that everything is falling into place. She is excited to see her grandson in several weeks who is currently serving in [place]. She reports that she skypes with him regularly.   Pertaining to her recent surgery, she if feeling wonderful. Was hoping to be able to go to cardiac rehab but there has been issues with her insurance and it is too expensive for her. She is planning and starting to walk regularly.  Pertaining to her diabetes, she is compliant with medications and diet. Concerned about gaining weight in the hospital but she understands that this was related to fluid 2/2 heart failure. Her EF returned to normal following CABG.  Pertaining to her HTN, she is taking medications as prescribed. Feeling better since switch to metoprolol. She reports feeling very tired on betapace.    Past Medical History  Diagnosis Date  . Diabetes mellitus without complication   . Hypertension   . Hyperlipidemia   . Hypothyroidism   . Diabetic neuropathy   . Coronary artery disease     Non-ST elevation myocardial infarction in April of 2015. Cardiac  catheterization showed severe three-vessel coronary artery disease with ejection fraction of30% and mild to moderate mitral regurgitation. She underwent CABG at [facility] with LIMA to LAD, SVG to OM and SVG to right PDA.  Marland Kitchen Chronic systolic heart failure     Due to ischemic cardiomyopathy. Ejection fraction was 35% and improved to normal after CABG  . Anemia      Past Surgical History  Procedure Laterality Date  . Eye surgery      Cataract both eyes  . Coronary artery bypass graft N/A 07/02/2013    Procedure: CORONARY ARTERY BYPASS GRAFTING (CABG);  Surgeon: Ivin [name], MD;  Location: [place];  Service: Open Heart Surgery;  Laterality: N/A;  CABG x three, using left internal mammary artery and right leg greater saphenous vein harvested endoscopically  . Intraoperative transesophageal echocardiogram N/A 07/02/2013    Procedure: INTRAOPERATIVE TRANSESOPHAGEAL ECHOCARDIOGRAM;  Surgeon: Ivin [name], MD;  Location: [place];  Service: Open Heart Surgery;  Laterality: N/A;     Family History  Problem Relation Age of Onset  . COPD Mother   . Cancer Mother     Lung  . Pulmonary embolism Father      History   Social History  . Marital Status: Married    Spouse Name: N/A    Number of Children: N/A  . Years of Education: N/A   Occupational History  . Not on file.   Social History Main Topics  .  Smoking status: Never Smoker   . Smokeless tobacco: Never Used  . Alcohol Use: No  . Drug Use: No  . Sexual Activity: Not on file   Other Topics Concern  . Not on file   Social History Narrative   Patient lives at home with her husband. Patient has 4 adult children.     Current Outpatient Prescriptions on File Prior to Visit  Medication Sig Dispense Refill  . aspirin EC 325 MG EC tablet Take 1 tablet (325 mg total) by mouth daily.  30 tablet  0  . ferrous Q000111Q C-folic acid (TRINSICON / FOLTRIN) capsule Take 1 capsule by mouth every morning. For one month then stop.  30  capsule  0  . furosemide (LASIX) 20 MG tablet Take 1 tablet (20 mg total) by mouth daily.  90 tablet  3  . gabapentin (NEURONTIN) 300 MG capsule Take 1 capsule (300 mg total) by mouth 3 (three) times daily.  90 capsule  3  . glucose blood test strip Please dispense Contour next EZ test strips. She is testing 4 times a day. Diagnosis code 250.0  120 each  12  . insulin detemir (LEVEMIR) 100 UNIT/ML injection Inject 12 Units into the skin at bedtime.      . Insulin Syringe-Needle U-100 (INSULIN SYRINGE .5CC/30GX1/2") 30G X 1/2" 0.5 ML MISC Use as directed  100 each  5  . Lancets (FREESTYLE) lancets USE TO TEST BLOOD SUGAR FOUR TIMES DAILY  100 each  5  . levothyroxine (SYNTHROID, LEVOTHROID) 50 MCG tablet Take 1 tablet (50 mcg total) by mouth daily.  30 tablet  3  . lisinopril (PRINIVIL,ZESTRIL) 40 MG tablet Take 40 mg by mouth daily.       . metFORMIN (GLUCOPHAGE) 500 MG tablet Take 1 tablet (500 mg total) by mouth 2 (two) times daily with a meal.      . metoprolol tartrate (LOPRESSOR) 25 MG tablet Take 1 tablet (25 mg total) by mouth 2 (two) times daily.  180 tablet  3  . nitroGLYCERIN (NITROSTAT) 0.4 MG SL tablet Place 1 tablet (0.4 mg total) under the tongue every 5 (five) minutes as needed for chest pain.  25 tablet  3  . simvastatin (ZOCOR) 10 MG tablet Take 1 tablet (10 mg total) by mouth at bedtime.  30 tablet  3  . traZODone (DESYREL) 25 mg TABS tablet Take 0.5 tablets (25 mg total) by mouth at bedtime.  30 tablet  6   No current facility-administered medications on file prior to visit.     Review of Systems  Constitutional: Negative.  Negative for fatigue.  HENT: Negative.   Eyes: Negative.   Respiratory: Negative.  Negative for shortness of breath.   Cardiovascular: Negative.  Negative for chest pain and leg swelling.  Gastrointestinal: Positive for constipation.  Endocrine: Negative.   Genitourinary: Negative.   Musculoskeletal: Negative.   Skin: Negative.     Allergic/Immunologic: Negative.   Neurological: Negative.  Negative for syncope, weakness and light-headedness.  Hematological: Negative.   Psychiatric/Behavioral: Negative.        Objective:  BP 134/80  Pulse 71  Temp(Src) 98.1 F (36.7 C) (Oral)  Resp 14  Wt 157 lb 8 oz (71.442 kg)  SpO2 99%   Physical Exam  Constitutional: She is oriented to person, place, and time. No distress.  HENT:  Head: Normocephalic and atraumatic.  Eyes: Conjunctivae and EOM are normal.  Neck: Normal range of motion. Neck supple.  Cardiovascular: Normal rate, regular  rhythm, normal heart sounds and intact distal pulses.  Exam reveals no gallop and no friction rub.   No murmur heard. Pulmonary/Chest: Effort normal and breath sounds normal. No respiratory distress. She has no wheezes. She has no rales.  Abdominal: Soft. Bowel sounds are normal.  Musculoskeletal: Normal range of motion. She exhibits no edema.  Neurological: She is alert and oriented to person, place, and time. She has normal reflexes. Coordination normal.  Skin: Skin is warm and dry.  Psychiatric: She has a normal mood and affect. Her behavior is normal. Judgment and thought content normal.       Assessment & Plan:   1. Diabetes type 2, controlled Check hgbA1c. Adjust meds if indicated. Continue to follow - Hemoglobin A1c; Future  2. HLD (hyperlipidemia) Check lipids. On statin. Will also check liver panel. Continue to follow - Hepatic panel - Lipid panel; Future  3. HTN (hypertension) Compliant with medication. Continue to follow, check labs - Basic metabolic panel; Future  4. Low magnesium levels Low magnesium while hospitalized. Check level - Magnesium; Future  5. Anemia Anemia during hospitalization s/p PRBCs. Check levels - CBC with Differential; Future  6. Constipation Miralax bid x 3-4 days then daily. Advised to take with hot coffee in the morning. If no improvement will call office.

## 2013-08-10 NOTE — Progress Notes (Signed)
Pre visit review using our clinic review tool, if applicable. No additional management support is needed unless otherwise documented below in the visit note. 

## 2013-08-10 NOTE — Patient Instructions (Signed)
The patient was given verbal instructions regarding driving progression as well as activity progression.

## 2013-08-11 ENCOUNTER — Encounter: Payer: Self-pay | Admitting: Adult Health

## 2013-08-11 ENCOUNTER — Other Ambulatory Visit (INDEPENDENT_AMBULATORY_CARE_PROVIDER_SITE_OTHER): Payer: Self-pay

## 2013-08-11 DIAGNOSIS — R790 Abnormal level of blood mineral: Secondary | ICD-10-CM

## 2013-08-11 DIAGNOSIS — E119 Type 2 diabetes mellitus without complications: Secondary | ICD-10-CM

## 2013-08-11 DIAGNOSIS — I10 Essential (primary) hypertension: Secondary | ICD-10-CM

## 2013-08-11 DIAGNOSIS — E785 Hyperlipidemia, unspecified: Secondary | ICD-10-CM

## 2013-08-11 DIAGNOSIS — D649 Anemia, unspecified: Secondary | ICD-10-CM

## 2013-08-11 LAB — BASIC METABOLIC PANEL
BUN: 28 mg/dL — ABNORMAL HIGH (ref 6–23)
CO2: 27 mEq/L (ref 19–32)
Calcium: 9.5 mg/dL (ref 8.4–10.5)
Chloride: 105 mEq/L (ref 96–112)
Creatinine, Ser: 0.9 mg/dL (ref 0.4–1.2)
GFR: 65.43 mL/min (ref >60.00)
Glucose, Bld: 83 mg/dL (ref 70–99)
Potassium: 4.6 mEq/L (ref 3.5–5.1)
Sodium: 141 mEq/L (ref 135–145)

## 2013-08-11 LAB — LIPID PANEL
Cholesterol: 195 mg/dL (ref 0–200)
HDL: 43.8 mg/dL (ref >39.00)
LDL Cholesterol: 134 mg/dL — ABNORMAL HIGH (ref 0–99)
NonHDL: 151.2
Total CHOL/HDL Ratio: 4
Triglycerides: 88 mg/dL (ref 0.0–149.0)
VLDL: 17.6 mg/dL (ref 0.0–40.0)

## 2013-08-11 LAB — CBC WITH DIFFERENTIAL/PLATELET
Basophils Absolute: 0.1 10*3/uL (ref 0.0–0.1)
Basophils Relative: 0.9 % (ref 0.0–3.0)
Eosinophils Absolute: 0.3 10*3/uL (ref 0.0–0.7)
Eosinophils Relative: 4.5 % (ref 0.0–5.0)
HCT: 34.8 % — ABNORMAL LOW (ref 36.0–46.0)
Hemoglobin: 11.5 g/dL — ABNORMAL LOW (ref 12.0–15.0)
Lymphocytes Relative: 29.8 % (ref 12.0–46.0)
Lymphs Abs: 1.7 10*3/uL (ref 0.7–4.0)
MCHC: 33.1 g/dL (ref 30.0–36.0)
MCV: 85.2 fl (ref 78.0–100.0)
Monocytes Absolute: 0.4 10*3/uL (ref 0.1–1.0)
Monocytes Relative: 6.4 % (ref 3.0–12.0)
Neutro Abs: 3.4 10*3/uL (ref 1.4–7.7)
Neutrophils Relative %: 58.4 % (ref 43.0–77.0)
Platelets: 263 10*3/uL (ref 150.0–400.0)
RBC: 4.09 Mil/uL (ref 3.87–5.11)
RDW: 15.6 % — ABNORMAL HIGH (ref 11.5–15.5)
WBC: 5.8 10*3/uL (ref 4.0–10.5)

## 2013-08-11 LAB — HEMOGLOBIN A1C: Hgb A1c MFr Bld: 6.8 % — ABNORMAL HIGH (ref 4.6–6.5)

## 2013-08-11 LAB — MAGNESIUM: Magnesium: 1.8 mg/dL (ref 1.5–2.5)

## 2013-09-22 ENCOUNTER — Other Ambulatory Visit: Payer: Self-pay | Admitting: *Deleted

## 2013-09-22 MED ORDER — LISINOPRIL 40 MG PO TABS: 40.0000 mg | ORAL_TABLET | Freq: Every day | ORAL | Status: DC

## 2013-09-28 ENCOUNTER — Telehealth: Payer: Self-pay

## 2013-09-28 NOTE — Telephone Encounter (Signed)
Patient's husband called stating that patient had been taking 20mg  of lisinopril. When he got her refill this month it was 40mg . As far back as I looked in her chart it has always been 40mg .Husband states that her discharge paperwork form the hospital said that she is on 20mg . Patient and husband would like to know which dose of lisinopril she should taking 20mg  or 40mg ? Please advise.

## 2013-09-28 NOTE — Telephone Encounter (Signed)
Notified patient of Raquel's comments. Patient stated that Dr. Donalda Ewings (spelling) decrease dose to 20mg . Told patient to contact Dr. Dale Sims office to verify dose since that provider prescribed it. Patient verbalized understanding.

## 2013-09-28 NOTE — Telephone Encounter (Signed)
08/22/12 Cardiology increased her dose to 40 mg because b/p was not controlled.

## 2013-10-27 ENCOUNTER — Ambulatory Visit: Payer: Self-pay | Admitting: Cardiovascular Disease

## 2013-11-05 ENCOUNTER — Encounter: Payer: Self-pay | Admitting: Cardiovascular Disease

## 2013-11-05 ENCOUNTER — Ambulatory Visit (INDEPENDENT_AMBULATORY_CARE_PROVIDER_SITE_OTHER): Payer: Self-pay | Admitting: Cardiovascular Disease

## 2013-11-05 ENCOUNTER — Encounter: Payer: Self-pay | Admitting: *Deleted

## 2013-11-05 VITALS — BP 168/98 | HR 64 | Ht 64.0 in | Wt 166.8 lb

## 2013-11-05 DIAGNOSIS — I209 Angina pectoris, unspecified: Secondary | ICD-10-CM

## 2013-11-05 DIAGNOSIS — I4891 Unspecified atrial fibrillation: Secondary | ICD-10-CM

## 2013-11-05 DIAGNOSIS — I9789 Other postprocedural complications and disorders of the circulatory system, not elsewhere classified: Principal | ICD-10-CM

## 2013-11-05 DIAGNOSIS — E785 Hyperlipidemia, unspecified: Secondary | ICD-10-CM

## 2013-11-05 DIAGNOSIS — I5022 Chronic systolic (congestive) heart failure: Secondary | ICD-10-CM

## 2013-11-05 DIAGNOSIS — I10 Essential (primary) hypertension: Secondary | ICD-10-CM

## 2013-11-05 DIAGNOSIS — I2510 Atherosclerotic heart disease of native coronary artery without angina pectoris: Secondary | ICD-10-CM

## 2013-11-05 DIAGNOSIS — I25119 Atherosclerotic heart disease of native coronary artery with unspecified angina pectoris: Secondary | ICD-10-CM

## 2013-11-05 DIAGNOSIS — I519 Heart disease, unspecified: Secondary | ICD-10-CM

## 2013-11-05 MED ORDER — CARVEDILOL 6.25 MG PO TABS: 6.2500 mg | ORAL_TABLET | Freq: Two times a day (BID) | ORAL | Status: DC

## 2013-11-05 MED ORDER — ATORVASTATIN CALCIUM 40 MG PO TABS: 40.0000 mg | ORAL_TABLET | Freq: Every day | ORAL | Status: DC

## 2013-11-05 NOTE — Assessment & Plan Note (Signed)
Lab Results  Component Value Date   CHOL 195 08/11/2013   HDL 43.80 08/11/2013   LDLCALC 134* 08/11/2013   LDLDIRECT 126.9 10/31/2012   TRIG 88.0 08/11/2013   CHOLHDL 4 08/11/2013   LDL was not at target. Thus, I stopped simvastatin and started on atorvastatin 40 mg once daily. Check fasting lipid and liver profile in 6 weeks.

## 2013-11-05 NOTE — Assessment & Plan Note (Addendum)
She is doing very well with no symptoms suggestive of angina. Continue medical therapy. She can resume work next week with restrictions of no lifting more than 30 pounds. This restriction can likely be removed in 3 months.

## 2013-11-05 NOTE — Assessment & Plan Note (Signed)
Blood pressure is elevated. I switched metoprolol to carvedilol especially that she is diabetic.

## 2013-11-05 NOTE — Patient Instructions (Addendum)
Your physician has recommended you make the following change in your medication:  Stop Metoprolol  Stop Simvastatin  Start Carvedilol 6.25 mg twice daily  Start Atorvastatin 40 mg once daily   Your physician recommends that you return for lab work in:  Fasting labs in 6 weeks  Your physician recommends that you schedule a follow-up appointment in:  3 months with Dr. Fletcher Anon

## 2013-11-05 NOTE — Progress Notes (Signed)
HPI  This is a 58 year old female who is here today for followup regarding coronary artery disease status post CABG. She has known history of diabetes, hypertension and hyperlipidemia. She presented in April of this year with non-ST elevation myocardial infarction with late presentation. Ejection fraction was 35% with multiple wall motion abnormalities and moderate mitral regurgitation. She underwent cardiac catheterization which showed severe three-vessel coronary artery disease. She underwent CABG at [facility]. She had postoperative hypotension and atrial fibrillation. She was discharged home but presented back to [facility] on May 16 with worsening dyspnea, leg edema and weight gain. Chest x-ray showed worsening pleural effusion on the left side. She ruled out for myocardial infarction. She underwent left-sided thoracentesis with removal of 600 cc of serosanguineous fluid. She was given IV Lasix and then transitioned back to oral. She has been doing better since hospital discharge was improved dyspnea.  She has been doing exceedingly well with no significant chest pain, shortness of breath orthopnea. She wants to go back to work. Blood pressure at home has been well-controlled.   No Known Allergies   Current Outpatient Prescriptions on File Prior to Visit  Medication Sig Dispense Refill  . aspirin EC 325 MG EC tablet Take 1 tablet (325 mg total) by mouth daily.  30 tablet  0  . ferrous Q000111Q C-folic acid (TRINSICON / FOLTRIN) capsule Take 1 capsule by mouth every morning. For one month then stop.  30 capsule  0  . furosemide (LASIX) 20 MG tablet Take 1 tablet (20 mg total) by mouth daily.  90 tablet  3  . gabapentin (NEURONTIN) 300 MG capsule Take 1 capsule (300 mg total) by mouth 3 (three) times daily.  90 capsule  3  . glucose blood test strip Please dispense Contour next EZ test strips. She is testing 4 times a day. Diagnosis code 250.0  120 each  12  . insulin detemir (LEVEMIR) 100  UNIT/ML injection Inject 12 Units into the skin at bedtime.      . Insulin Syringe-Needle U-100 (INSULIN SYRINGE .5CC/30GX1/2") 30G X 1/2" 0.5 ML MISC Use as directed  100 each  5  . Lancets (FREESTYLE) lancets USE TO TEST BLOOD SUGAR FOUR TIMES DAILY  100 each  5  . levothyroxine (SYNTHROID, LEVOTHROID) 50 MCG tablet Take 1 tablet (50 mcg total) by mouth daily.  30 tablet  3  . metFORMIN (GLUCOPHAGE) 500 MG tablet Take 1 tablet (500 mg total) by mouth 2 (two) times daily with a meal.      . metoprolol tartrate (LOPRESSOR) 25 MG tablet Take 1 tablet (25 mg total) by mouth 2 (two) times daily.  180 tablet  3  . nitroGLYCERIN (NITROSTAT) 0.4 MG SL tablet Place 1 tablet (0.4 mg total) under the tongue every 5 (five) minutes as needed for chest pain.  25 tablet  3  . simvastatin (ZOCOR) 10 MG tablet Take 1 tablet (10 mg total) by mouth at bedtime.  30 tablet  3  . traZODone (DESYREL) 25 mg TABS tablet Take 0.5 tablets (25 mg total) by mouth at bedtime.  30 tablet  6   No current facility-administered medications on file prior to visit.     Past Medical History  Diagnosis Date  . Diabetes mellitus without complication   . Hypertension   . Hyperlipidemia   . Hypothyroidism   . Diabetic neuropathy   . Chronic systolic heart failure     Due to ischemic cardiomyopathy. Ejection fraction was 35% and improved to normal  after CABG  . Anemia   . Coronary artery disease     Non-ST elevation myocardial infarction in April of 2015. Cardiac catheterization showed severe three-vessel coronary artery disease with ejection fraction of30% and mild to moderate mitral regurgitation. She [facility]G at [facility] with LIMA to LAD, SVG to OM and SVG to right PDA.     Past Surgical History  Procedure Laterality Date  . Eye surgery      Cataract both eyes  . Coronary artery bypass graft N/A 07/02/2013    Procedure: CORONARY ARTERY BYPASS GRAFTING (CABG);  Surgeon: Ivin [name], MD;  Location: [facility];  Service:  Open Heart Surgery;  Laterality: N/A;  CABG x three, using left internal mammary artery and right leg greater saphenous vein harvested endoscopically  . Intraoperative transesophageal echocardiogram N/A 07/02/2013    Procedure: INTRAOPERATIVE TRANSESOPHAGEAL ECHOCARDIOGRAM;  Surgeon: Ivin [name], MD;  Location: [facility];  Service: Open Heart Surgery;  Laterality: N/A;     Family History  Problem Relation Age of Onset  . COPD Mother   . Cancer Mother     Lung  . Pulmonary embolism Father      History   Social History  . Marital Status: Married    Spouse Name: N/A    Number of Children: N/A  . Years of Education: N/A   Occupational History  . Not on file.   Social History Main Topics  . Smoking status: Never Smoker   . Smokeless tobacco: Never Used  . Alcohol Use: No  . Drug Use: No  . Sexual Activity: Not on file   Other Topics Concern  . Not on file   Social History Narrative   Patient lives at home with her husband. Patient has 4 adult children.     PHYSICAL EXAM   BP 168/98  Pulse 64  Ht 5\' 4"  (1.626 m)  Wt 166 lb 12 oz (75.637 kg)  BMI 28.61 kg/m2 Constitutional: She is oriented to person, place, and time. She appears well-developed and well-nourished. No distress.  HENT: No nasal discharge.  Head: Normocephalic and atraumatic.  Eyes: Pupils are equal and round. No discharge.  Neck: Normal range of motion. Neck supple. No JVD present. No thyromegaly present.  Cardiovascular: Normal rate, regular rhythm, normal heart sounds. Exam reveals no gallop and no friction rub. No murmur heard.  Pulmonary/Chest: Effort normal and diminished breath sounds at the left base. No stridor. No respiratory distress. She has no wheezes. She has no rales. She exhibits no tenderness.  Abdominal: Soft. Bowel sounds are normal. She exhibits no distension. There is no tenderness. There is no rebound and no guarding.  Musculoskeletal: Normal range of motion. She exhibits no edema  and no tenderness.  Neurological: She is alert and oriented to person, place, and time. Coordination normal.  Skin: Skin is warm and dry. No rash noted. She is not diaphoretic. No erythema. No pallor.  Psychiatric: She has a normal mood and affect. Her behavior is normal. Judgment and thought content normal.     [id number]  Rhythm  WITHIN NORMAL LIMITS   ASSESSMENT AND PLAN

## 2013-11-05 NOTE — Assessment & Plan Note (Addendum)
She appears to be euvolemic on physical exam with resolution of left-sided pleural effusion. Ejection fraction improved to normal after CABG

## 2013-11-12 ENCOUNTER — Other Ambulatory Visit: Payer: Self-pay | Admitting: *Deleted

## 2013-11-12 MED ORDER — INSULIN DETEMIR 100 UNIT/ML ~~LOC~~ SOLN: 12.0000 [IU] | Freq: Every day | SUBCUTANEOUS | Status: DC

## 2013-11-13 ENCOUNTER — Telehealth: Payer: Self-pay

## 2013-11-13 ENCOUNTER — Other Ambulatory Visit: Payer: Self-pay | Admitting: Adult Health

## 2013-11-13 NOTE — Telephone Encounter (Signed)
Received a voicemail from patient's husband stating they can no longer afford patient's Rx for Levemir. Patient is without insurance at this time and the Rx is $300. Would like to know if there is something more affordable you can prescribe her while she is without insurance. Please advise.

## 2013-11-17 ENCOUNTER — Other Ambulatory Visit: Payer: Self-pay | Admitting: Adult Health

## 2013-11-17 ENCOUNTER — Telehealth: Payer: Self-pay

## 2013-11-17 MED ORDER — INSULIN NPH (HUMAN) (ISOPHANE) 100 UNIT/ML ~~LOC~~ SUSP: SUBCUTANEOUS | Status: DC

## 2013-11-17 MED ORDER — "INSULIN SYRINGE 30G X 1/2"" 0.5 ML MISC": Status: DC

## 2013-11-17 NOTE — Telephone Encounter (Signed)
Im not sure if you got my 1st message about this. Received a voicemail from patient's husband stating they can no longer afford patient's Rx for Levemir. Patient is without insurance at this time and the Rx is $300. Would like to know if there is something more affordable you can prescribe her while she is without insurance. Please advise

## 2013-11-17 NOTE — Telephone Encounter (Signed)
Should be about $25

## 2013-11-17 NOTE — Telephone Encounter (Signed)
Called patient to notify her of the changes that have been made to her medication per raquel's request. Patient verbalized understanding. Patient was very thankful and stated she will give me a call back if medication is still too expensive.

## 2013-11-17 NOTE — Telephone Encounter (Signed)
Yes. We can do Novolin N injections twice a day. I will send the new order

## 2013-12-15 ENCOUNTER — Other Ambulatory Visit: Payer: Self-pay | Admitting: *Deleted

## 2013-12-15 MED ORDER — GABAPENTIN 300 MG PO CAPS: 300.0000 mg | ORAL_CAPSULE | Freq: Three times a day (TID) | ORAL | Status: DC

## 2013-12-18 ENCOUNTER — Other Ambulatory Visit (INDEPENDENT_AMBULATORY_CARE_PROVIDER_SITE_OTHER): Payer: Self-pay

## 2013-12-18 DIAGNOSIS — E785 Hyperlipidemia, unspecified: Secondary | ICD-10-CM

## 2013-12-19 LAB — HEPATIC FUNCTION PANEL
ALT: 14 IU/L (ref 0–32)
AST: 17 IU/L (ref 0–40)
Albumin: 3.8 g/dL (ref 3.6–4.8)
Alkaline Phosphatase: 69 IU/L (ref 39–117)
Bilirubin, Direct: 0.07 mg/dL (ref 0.00–0.40)
Total Bilirubin: 0.4 mg/dL (ref 0.0–1.2)
Total Protein: 6.4 g/dL (ref 6.0–8.5)

## 2013-12-19 LAB — LIPID PANEL
Chol/HDL Ratio: 2.7 ratio units (ref 0.0–4.4)
Cholesterol, Total: 171 mg/dL (ref 100–199)
HDL: 63 mg/dL (ref >39)
LDL Calculated: 93 mg/dL (ref 0–99)
Triglycerides: 77 mg/dL (ref 0–149)
VLDL Cholesterol Cal: 15 mg/dL (ref 5–40)

## 2013-12-21 ENCOUNTER — Other Ambulatory Visit: Payer: Self-pay | Admitting: *Deleted

## 2013-12-21 MED ORDER — METFORMIN HCL 500 MG PO TABS: 500.0000 mg | ORAL_TABLET | Freq: Two times a day (BID) | ORAL | Status: DC

## 2013-12-21 MED ORDER — LEVOTHYROXINE SODIUM 50 MCG PO TABS: ORAL_TABLET | ORAL | Status: DC

## 2013-12-21 NOTE — Progress Notes (Signed)
LVM 10/19

## 2014-01-13 ENCOUNTER — Telehealth: Payer: Self-pay | Admitting: *Deleted

## 2014-01-13 MED ORDER — ATORVASTATIN CALCIUM 80 MG PO TABS: 80.0000 mg | ORAL_TABLET | Freq: Every day | ORAL | Status: DC

## 2014-01-13 NOTE — Telephone Encounter (Signed)
-----   Message from Wellington Hampshire, MD sent at 12/21/2013 10:27 AM EDT ----- Inform patient that labs were normal. Cholesterol was good but still not at target. Increase Atorvastatin to 80 mg daily. Recheck same labs in 2 months.

## 2014-02-05 ENCOUNTER — Ambulatory Visit: Payer: Self-pay | Admitting: Cardiovascular Disease

## 2014-03-12 ENCOUNTER — Other Ambulatory Visit: Payer: Self-pay

## 2014-03-15 ENCOUNTER — Other Ambulatory Visit: Payer: Self-pay

## 2014-03-19 ENCOUNTER — Ambulatory Visit: Payer: Self-pay | Admitting: Cardiovascular Disease

## 2014-03-22 ENCOUNTER — Other Ambulatory Visit: Payer: Self-pay | Admitting: Physician Assistant

## 2014-03-23 ENCOUNTER — Other Ambulatory Visit: Payer: Self-pay | Admitting: *Deleted

## 2014-03-23 MED ORDER — TRAZODONE HCL 50 MG PO TABS: 25.0000 mg | ORAL_TABLET | Freq: Every day | ORAL | Status: DC

## 2014-03-29 ENCOUNTER — Telehealth: Payer: Self-pay

## 2014-03-29 NOTE — Telephone Encounter (Signed)
Pt called, states she has had nausea and weakness for 1 week and would like to talk with a nurse. Please call.

## 2014-03-29 NOTE — Telephone Encounter (Signed)
Patient stated she has been nauseas  She stated she has been feeling weak and had a lack of appetite  Denies any other symptoms  She stated this was non emergent    Instructed patient to keep her appt with Christell Faith tomorrow 1/26 Patient verbalized understanding

## 2014-03-30 ENCOUNTER — Ambulatory Visit (INDEPENDENT_AMBULATORY_CARE_PROVIDER_SITE_OTHER): Payer: Self-pay | Admitting: Physician Assistant

## 2014-03-30 ENCOUNTER — Encounter: Payer: Self-pay | Admitting: Physician Assistant

## 2014-03-30 ENCOUNTER — Ambulatory Visit: Payer: Self-pay | Admitting: Cardiovascular Disease

## 2014-03-30 VITALS — BP 158/92 | HR 64 | Ht 64.0 in | Wt 181.5 lb

## 2014-03-30 DIAGNOSIS — I5022 Chronic systolic (congestive) heart failure: Secondary | ICD-10-CM

## 2014-03-30 DIAGNOSIS — I10 Essential (primary) hypertension: Secondary | ICD-10-CM

## 2014-03-30 DIAGNOSIS — I25119 Atherosclerotic heart disease of native coronary artery with unspecified angina pectoris: Secondary | ICD-10-CM

## 2014-03-30 DIAGNOSIS — E785 Hyperlipidemia, unspecified: Secondary | ICD-10-CM

## 2014-03-30 DIAGNOSIS — I5021 Acute systolic (congestive) heart failure: Secondary | ICD-10-CM

## 2014-03-30 LAB — BASIC METABOLIC PANEL WITH GFR
BUN: 21 mg/dL (ref 4–21)
Creatinine: 1.6 mg/dL — AB (ref 0.5–1.1)
Glucose: 187 mg/dL
Potassium: 4.3 mmol/L (ref 3.4–5.3)
Sodium: 140 mmol/L (ref 137–147)

## 2014-03-30 LAB — BASIC METABOLIC PANEL
Anion Gap: 6 — ABNORMAL LOW (ref 7–16)
BUN: 21 mg/dL — ABNORMAL HIGH (ref 7–18)
Calcium, Total: 9 mg/dL (ref 8.5–10.1)
Chloride: 105 mmol/L (ref 98–107)
Co2: 29 mmol/L (ref 21–32)
Creatinine: 1.55 mg/dL — ABNORMAL HIGH (ref 0.60–1.30)
EGFR (African American): 44 — ABNORMAL LOW
EGFR (Non-African Amer.): 36 — ABNORMAL LOW
Glucose: 187 mg/dL — ABNORMAL HIGH (ref 65–99)
Osmolality: 287 (ref 275–301)
Potassium: 4.3 mmol/L (ref 3.5–5.1)
Sodium: 140 mmol/L (ref 136–145)

## 2014-03-30 MED ORDER — FUROSEMIDE 40 MG PO TABS: 40.0000 mg | ORAL_TABLET | Freq: Every day | ORAL | Status: DC

## 2014-03-30 NOTE — Progress Notes (Signed)
Patient Name: Jamie Arias, Jamie Arias August 29, 1955, MRN [id number]  Date of Encounter: 03/30/2014  Primary Care Provider:  Rubbie Battiest, NP Primary Cardiologist:  Dr. Fletcher Anon, MD   HPI:  This is a 59 year old female who is here today for followup regarding coronary artery disease status post CABG. She has known history of diabetes, hypertension and hyperlipidemia. She presented in April of this year with non-ST elevation myocardial infarction with late presentation. Ejection fraction was 35% with multiple wall motion abnormalities and moderate mitral regurgitation. She underwent cardiac catheterization which showed severe three-vessel coronary artery disease. She underwent CABG at [facility]. She had postoperative hypotension and atrial fibrillation.Follow up echo performed by Dr. Prescott Gum on 07/06/2013 showed improved EF of 55-60%, no RWMAs, moderate TR, trivial pericardial effusion not c/w tamponade physiology. She was discharged home, but presented back to [facility] on May 16 with worsening dyspnea, leg edema and weight gain. Chest x-ray showed worsening pleural effusion on the left side. She ruled out for myocardial infarction. She underwent left-sided thoracentesis with removal of 600 cc of serosanguineous fluid. She was given IV Lasix and then transitioned back to oral. She has been doing better since hospital discharge was improved dyspnea.      At her last follow up in 11/2013 she was doing exceedingly well with no significant chest pain, shortness of breath orthopnea. She wanted to go back to work.   She called 1/25 stating she had been nauseated and weak for the past 1 week. She come in stating 1-2 weeks ago she noticed some bilateral lower extremity edema along her ankles that concerned her somewhat but she was hoping to hold out until her scheduled follow up with Dr. Fletcher Anon on 04/16/14. Per her report the lower extremity edema resolved on its own within a couple of days. She denied any SOB, cough,  orthopnea, early satiety, chest pain, or sudden weight gain. She does know that her weight has increased since her last office visit with [place], though it has been a gradual increase and more lifestyle related.   This past weekend she developed some nausea and diarrhea x 2. She took some Pepto like she always does with relief of symptoms. No further GI symptoms. There have been some coworkers with GI related illnesses. She became worried with the recent bilateral lower extremity edema and GI related issues and she wanted to get checked out. She does eat out at restaurants/fast food 3-4 times per week. No formal exercise program.    Problem List:   Past Medical History  Diagnosis Date  . Diabetes mellitus without complication   . Hypertension   . Hyperlipidemia   . Hypothyroidism   . Diabetic neuropathy   . Chronic systolic heart failure     a. Due to ischemic cardiomyopathy. EF as low as 35%, improved to normal s/p CABG; b. echo 07/06/13: EF 55-60%, no RWMAs, mod TR, trivial pericardial effusion not c/w tamponade physiology  . Anemia   . Coronary artery disease     a. NSTEMI 06/2013; b.cath: severe three-vessel CAD w/ EF 30% & mild-mod MR; c. s/p 3 vessel CABG 07/02/13 (LIMA-LAD, SVG-OM, and SVG-RPDA).   Past Surgical History  Procedure Laterality Date  . Eye surgery      Cataract both eyes  . Coronary artery bypass graft N/A 07/02/2013    Procedure: CORONARY ARTERY BYPASS GRAFTING (CABG);  Surgeon: Ivin [name], MD;  Location: [place];  Service: Open Heart Surgery;  Laterality: N/A;  CABG x  three, using left internal mammary artery and right leg greater saphenous vein harvested endoscopically  . Intraoperative transesophageal echocardiogram N/A 07/02/2013    Procedure: INTRAOPERATIVE TRANSESOPHAGEAL ECHOCARDIOGRAM;  Surgeon: Ivin [name], MD;  Location: [place];  Service: Open Heart Surgery;  Laterality: N/A;     Allergies:  No Known Allergies   Home Medications:  Current Outpatient  Prescriptions  Medication Sig Dispense Refill  . aspirin EC 325 MG EC tablet Take 1 tablet (325 mg total) by mouth daily. 30 tablet 0  . atorvastatin (LIPITOR) 80 MG tablet Take 1 tablet (80 mg total) by mouth daily. 90 tablet 3  . carvedilol (COREG) 6.25 MG tablet Take 1 tablet (6.25 mg total) by mouth 2 (two) times daily. 180 tablet 3  . ferrous Q000111Q C-folic acid (TRINSICON / FOLTRIN) capsule Take 1 capsule by mouth every morning. For one month then stop. 30 capsule 0  . gabapentin (NEURONTIN) 300 MG capsule Take 1 capsule (300 mg total) by mouth 3 (three) times daily. 90 capsule 3  . glucose blood test strip Please dispense Contour next EZ test strips. She is testing 4 times a day. Diagnosis code 250.0 120 each 12  . insulin NPH Human (HUMULIN N,NOVOLIN N) 100 UNIT/ML injection Inject 6 units with breakfast and 6 units with supper. 10 mL 11  . Insulin Syringe-Needle U-100 (INSULIN SYRINGE .5CC/30GX1/2") 30G X 1/2" 0.5 ML MISC Use as directed 100 each 5  . Lancets (FREESTYLE) lancets USE TO TEST BLOOD SUGAR FOUR TIMES DAILY 100 each 5  . levothyroxine (SYNTHROID, LEVOTHROID) 50 MCG tablet TAKE ONE TABLET BY MOUTH ONCE DAILY 30 tablet 6  . lisinopril (PRINIVIL,ZESTRIL) 20 MG tablet Take 20 mg by mouth daily.    . metFORMIN (GLUCOPHAGE) 500 MG tablet Take 1 tablet (500 mg total) by mouth 2 (two) times daily with a meal. 60 tablet 5  . nitroGLYCERIN (NITROSTAT) 0.4 MG SL tablet Place 1 tablet (0.4 mg total) under the tongue every 5 (five) minutes as needed for chest pain. 25 tablet 3  . traZODone (DESYREL) 50 MG tablet Take 0.5 tablets (25 mg total) by mouth at bedtime. 15 tablet 0  . furosemide (LASIX) 40 MG tablet Take 1 tablet (40 mg total) by mouth daily. 30 tablet 6   No current facility-administered medications for this visit.     Weights: Wt Readings from Last 3 Encounters:  03/30/14 181 lb 8 oz (82.328 kg)  11/05/13 166 lb 12 oz (75.637 kg)  08/10/13 157 lb (71.215  kg)     Review of Systems:  As above.  All other systems reviewed and are otherwise negative except as noted above.  Physical Exam:  Blood pressure 158/92, pulse 64, height 5\' 4"  (1.626 m), weight 181 lb 8 oz (82.328 kg).  General: Pleasant, NAD Psych: Normal affect. Neuro: Alert and oriented X 3. Moves all extremities spontaneously. HEENT: Normal  Neck: Supple without bruits or JVD. Lungs:  Resp regular and unlabored, CTA. Heart: RRR no s3, s4, or murmurs. Abdomen: Soft, non-tender, non-distended, BS + x 4.  Extremities: No clubbing, cyanosis. Trace bilateral lower extremity edema to the ankles (left >right).   Accessory Clinical Findings:  EKG - NSR, 64 bpm, low voltage precordial leads, no significant st/t changes   Assessment & Plan:  1. Chronic systolic CHF: -She presents with intermittent bilateral lower extremity edema in the setting of poor diet high in salt (eat out at restaurants frequently, at least 3-4 times per week) -Possibly some component of  acute on chronic systolic CHF vs vascular insufficiency  -Increase Lasix to 40 mg daily for the next 1 week, then go back down to 20 mg daily -Decrease restaurant usage, lifestyle changes discussed in detail  -Check echo to verify no changes in EF  2. CAD s/p 3 vessel CABG 06/2013: -Doing well  -No symptoms of angina -No ischemic work up at this time -Continue Coreg 6.25 mg bid, lisinopril 20 mg daily, SL NTG (has not needed any), Lipitor 80 mg qhs, and aspirin   3. HTN: -Blood pressure at home running in the 120s/130s, she is yet to take her medications today therefore I did not make any adjustments other than increase her Lasix as above -Continue Coreg 6.25 mg bid, lisinopril 20, Lasix as above  4. HLD: -Continue Lipitor 80 mg (this was recently increased 3 months prior) -Goal LDL <70 (LDL at 90 on 12/2013) -She has fasting lipids scheduled 04/12/2014, further management will be based on those labs   5.  Dispo: -bmet today -fasting labs and echo 2/8 -follow up scheduled with Dr. Fletcher Anon 2/12   Christell Faith, PA-C [place],  [id number] [phone] [place] 03/30/2014, 4:53 PM

## 2014-03-30 NOTE — Telephone Encounter (Signed)
Please see OV from 03/25/14

## 2014-03-30 NOTE — Patient Instructions (Signed)
Your physician recommends that you have labs today:  BMP   Your physician has recommended you make the following change in your medication:  Increase Lasix to 40 mg once daily   Your physician has requested that you have an echocardiogram on the same day as your lab appointment on 04/12/14 if possible. Echocardiography is a painless test that uses sound waves to create images of your heart. It provides your doctor with information about the size and shape of your heart and how well your heart's chambers and valves are working. This procedure takes approximately one hour. There are no restrictions for this procedure.   Your physician recommends that you schedule a follow-up appointment in:  Keep follow up appointment with Dr. Fletcher Anon  Keep lab appointment on 04/12/14

## 2014-04-06 ENCOUNTER — Telehealth: Payer: Self-pay | Admitting: *Deleted

## 2014-04-06 MED ORDER — FUROSEMIDE 20 MG PO TABS: 20.0000 mg | ORAL_TABLET | Freq: Every day | ORAL | Status: DC

## 2014-04-06 NOTE — Telephone Encounter (Signed)
Basic metabolic panel  Status: Finalresult Visible to patient:  MyChart Nextappt: 04/13/2014 at 08:00 AM in Cardiology ([facility] Nurse) Dx:  Chronic systolic heart failure; Acute...       Notes Recorded by Tracie Harrier, RN on 04/06/2014 at 10:06 AM Reviewed results with patient.   Notes Recorded by Tracie Harrier, RN on 04/06/2014 at 9:57 AM Patients husband called and stated patient will call on her break to get results Notes Recorded by Tracie Harrier, RN on 04/05/2014 at 5:04 PM LVM 2/1 Notes Recorded by Wellington Hampshire, MD on 04/03/2014 at 11:15 AM Worsening renal function. Decrease Lasix back to 20 mg daily and increase fluid intake. Notes Recorded by Tracie Harrier, RN on 03/31/2014 at 8:19 AM RN reviewed. Forwarded to MD.    Specimen Collected: 03/30/14 Last Resulted: 03/30/14 8:14 AM

## 2014-04-12 ENCOUNTER — Other Ambulatory Visit: Payer: Self-pay

## 2014-04-13 ENCOUNTER — Other Ambulatory Visit: Payer: 59

## 2014-04-13 ENCOUNTER — Ambulatory Visit: Payer: Self-pay | Admitting: Cardiovascular Disease

## 2014-04-13 ENCOUNTER — Other Ambulatory Visit (INDEPENDENT_AMBULATORY_CARE_PROVIDER_SITE_OTHER): Payer: 59

## 2014-04-13 ENCOUNTER — Other Ambulatory Visit: Payer: Self-pay

## 2014-04-13 DIAGNOSIS — I25119 Atherosclerotic heart disease of native coronary artery with unspecified angina pectoris: Secondary | ICD-10-CM

## 2014-04-13 DIAGNOSIS — I5021 Acute systolic (congestive) heart failure: Secondary | ICD-10-CM

## 2014-04-13 DIAGNOSIS — I5022 Chronic systolic (congestive) heart failure: Secondary | ICD-10-CM

## 2014-04-13 DIAGNOSIS — E785 Hyperlipidemia, unspecified: Secondary | ICD-10-CM

## 2014-04-13 LAB — HEPATIC FUNCTION PANEL
ALT: 23 U/L (ref 7–35)
AST: 66 U/L — AB (ref 13–35)
Alkaline Phosphatase: 69 U/L (ref 25–125)
Bilirubin, Total: 0.5 mg/dL

## 2014-04-13 LAB — LIPID PANEL
Cholesterol: 182 mg/dL (ref 0–200)
HDL: 70 mg/dL (ref 35–70)
LDL Cholesterol: 93 mg/dL
Triglycerides: 93 mg/dL (ref 40–160)

## 2014-04-15 ENCOUNTER — Other Ambulatory Visit: Payer: Self-pay | Admitting: *Deleted

## 2014-04-15 MED ORDER — GLUCOSE BLOOD VI STRP: ORAL_STRIP | Status: DC

## 2014-04-16 ENCOUNTER — Ambulatory Visit (INDEPENDENT_AMBULATORY_CARE_PROVIDER_SITE_OTHER): Payer: 59 | Admitting: Cardiovascular Disease

## 2014-04-16 ENCOUNTER — Ambulatory Visit: Payer: Self-pay | Admitting: Cardiovascular Disease

## 2014-04-16 ENCOUNTER — Encounter: Payer: Self-pay | Admitting: Cardiovascular Disease

## 2014-04-16 ENCOUNTER — Encounter: Payer: Self-pay | Admitting: *Deleted

## 2014-04-16 VITALS — BP 158/86 | HR 71 | Ht 64.0 in | Wt 194.0 lb

## 2014-04-16 DIAGNOSIS — E785 Hyperlipidemia, unspecified: Secondary | ICD-10-CM

## 2014-04-16 DIAGNOSIS — I5022 Chronic systolic (congestive) heart failure: Secondary | ICD-10-CM

## 2014-04-16 DIAGNOSIS — I2510 Atherosclerotic heart disease of native coronary artery without angina pectoris: Secondary | ICD-10-CM

## 2014-04-16 DIAGNOSIS — I10 Essential (primary) hypertension: Secondary | ICD-10-CM

## 2014-04-16 NOTE — Progress Notes (Signed)
HPI  This is a 59 year old female who is here today for followup regarding coronary artery disease status post CABG. She has known history of diabetes, hypertension and hyperlipidemia. She presented in April of 2015 with non-ST elevation myocardial infarction with late presentation. Ejection fraction was 35% with multiple wall motion abnormalities and moderate mitral regurgitation. She underwent cardiac catheterization which showed severe three-vessel coronary artery disease. She underwent CABG at [facility]. She had postoperative hypotension and atrial fibrillation. She was discharged home but presented back to [facility] 16 with worsening dyspnea, leg edema and weight gain. Chest x-ray showed worsening pleural effusion on the left side. She ruled out for myocardial infarction. She underwent left-sided thoracentesis with removal of 600 cc of serosanguineous fluid.  She has been doing reasonably well. She was seen recently by Thurmond Butts for an episode of shortness of breath. She was worried about fluid overload. She underwent an echocardiogram which showed an ejection fraction of 55-60% with grade 2 diastolic dysfunction, mild aortic regurgitation and minimal pulmonary hypertension. Symptoms were self-limited. She denies chest pain. She has been taking metoprolol instead of carvedilol.   No Known Allergies   Current Outpatient Prescriptions on File Prior to Visit  Medication Sig Dispense Refill  . aspirin EC 325 MG EC tablet Take 1 tablet (325 mg total) by mouth daily. 30 tablet 0  . atorvastatin (LIPITOR) 80 MG tablet Take 1 tablet (80 mg total) by mouth daily. 90 tablet 3  . ferrous [id number] C-folic acid (TRINSICON / FOLTRIN) capsule Take 1 capsule by mouth every morning. For one month then stop. 30 capsule 0  . furosemide (LASIX) 20 MG tablet Take 1 tablet (20 mg total) by mouth daily. 90 tablet 3  . gabapentin (NEURONTIN) 300 MG capsule Take 1 capsule (300 mg total) by mouth 3 (three) times  daily. 90 capsule 3  . glucose blood test strip Please dispense Contour next EZ test strips. She is testing 4 times a day. Diagnosis code E11.9 120 each 12  . insulin NPH Human (HUMULIN N,NOVOLIN N) 100 UNIT/ML injection Inject 6 units with breakfast and 6 units with supper. 10 mL 11  . Insulin Syringe-Needle U-100 (INSULIN SYRINGE .5CC/30GX1/2") 30G X 1/2" 0.5 ML MISC Use as directed 100 each 5  . Lancets (FREESTYLE) lancets USE TO TEST BLOOD SUGAR FOUR TIMES DAILY 100 each 5  . levothyroxine (SYNTHROID, LEVOTHROID) 50 MCG tablet TAKE ONE TABLET BY MOUTH ONCE DAILY 30 tablet 6  . lisinopril (PRINIVIL,ZESTRIL) 20 MG tablet Take 20 mg by mouth daily.    . metFORMIN (GLUCOPHAGE) 500 MG tablet Take 1 tablet (500 mg total) by mouth 2 (two) times daily with a meal. 60 tablet 5  . nitroGLYCERIN (NITROSTAT) 0.4 MG SL tablet Place 1 tablet (0.4 mg total) under the tongue every 5 (five) minutes as needed for chest pain. 25 tablet 3  . traZODone (DESYREL) 50 MG tablet Take 0.5 tablets (25 mg total) by mouth at bedtime. 15 tablet 0  . carvedilol (COREG) 6.25 MG tablet Take 1 tablet (6.25 mg total) by mouth 2 (two) times daily. (Patient not taking: Reported on 04/16/2014) 180 tablet 3   No current facility-administered medications on file prior to visit.     Past Medical History  Diagnosis Date  . Diabetes mellitus without complication   . Hypertension   . Hyperlipidemia   . Hypothyroidism   . Diabetic neuropathy   . Chronic systolic heart failure     a. Due to ischemic cardiomyopathy. EF  as low as 35%, improved to normal s/p CABG; b. echo 07/06/13: EF 55-60%, no RWMAs, mod TR, trivial pericardial effusion not c/w tamponade physiology  . Anemia   . Coronary artery disease     a. NSTEMI 06/2013; b.cath: severe three-vessel CAD w/ EF 30% & mild-mod MR; c. s/p 3 vessel CABG 07/02/13 (LIMA-LAD, SVG-OM, and SVG-RPDA).     Past Surgical History  Procedure Laterality Date  . Eye surgery      Cataract both  eyes  . Coronary artery bypass graft N/A 07/02/2013    Procedure: CORONARY ARTERY BYPASS GRAFTING (CABG);  Surgeon: Ivin [name], MD;  Location: [place];  Service: Open Heart Surgery;  Laterality: N/A;  CABG x three, using left internal mammary artery and right leg greater saphenous vein harvested endoscopically  . Intraoperative transesophageal echocardiogram N/A 07/02/2013    Procedure: INTRAOPERATIVE TRANSESOPHAGEAL ECHOCARDIOGRAM;  Surgeon: Ivin [name], MD;  Location: [place];  Service: Open Heart Surgery;  Laterality: N/A;     Family History  Problem Relation Age of Onset  . COPD Mother   . Cancer Mother     Lung  . Pulmonary embolism Father      History   Social History  . Marital Status: Married    Spouse Name: N/A  . Number of Children: N/A  . Years of Education: N/A   Occupational History  . Not on file.   Social History Main Topics  . Smoking status: Never Smoker   . Smokeless tobacco: Never Used  . Alcohol Use: No  . Drug Use: No  . Sexual Activity: Not on file   Other Topics Concern  . Not on file   Social History Narrative   Patient lives at home with her husband. Patient has 4 adult children.     PHYSICAL EXAM   BP 158/86 mmHg  Pulse 71  Ht 5\' 4"  (1.626 m)  Wt 194 lb (87.998 kg)  BMI 33.28 kg/m2 Constitutional: She is oriented to person, place, and time. She appears well-developed and well-nourished. No distress.  HENT: No nasal discharge.  Head: Normocephalic and atraumatic.  Eyes: Pupils are equal and round. No discharge.  Neck: Normal range of motion. Neck supple. No JVD present. No thyromegaly present.  Cardiovascular: Normal rate, regular rhythm, normal heart sounds. Exam reveals no gallop and no friction rub. No murmur heard.  Pulmonary/Chest: Effort normal and diminished breath sounds at the left base. No stridor. No respiratory distress. She has no wheezes. She has no rales. She exhibits no tenderness.  Abdominal: Soft. Bowel sounds are  normal. She exhibits no distension. There is no tenderness. There is no rebound and no guarding.  Musculoskeletal: Normal range of motion. She exhibits no edema and no tenderness.  Neurological: She is alert and oriented to person, place, and time. Coordination normal.  Skin: Skin is warm and dry. No rash noted. She is not diaphoretic. No erythema. No pallor.  Psychiatric: She has a normal mood and affect. Her behavior is normal. Judgment and thought content normal.       ASSESSMENT AND PLAN

## 2014-04-16 NOTE — Assessment & Plan Note (Signed)
She is doing well overall with no symptoms suggestive of angina. Continue medical therapy.

## 2014-04-16 NOTE — Patient Instructions (Signed)
Stop taking Metoprolol.  Start Carvedilol 6.25 mg twice daily.  Continue other medications.   You can resume work with no restrictions.   Your physician wants you to follow-up in: 6 months.  You will receive a reminder letter in the mail two months in advance. If you don't receive a letter, please call our office to schedule the follow-up appointment.

## 2014-04-16 NOTE — Assessment & Plan Note (Signed)
Lab Results  Component Value Date   CHOL 195 08/11/2013   HDL 63 12/18/2013   LDLCALC 93 12/18/2013   LDLDIRECT 126.9 10/31/2012   TRIG 77 12/18/2013   CHOLHDL 2.7 12/18/2013   Most recent LDL was 93. She is on maximal dose atorvastatin. I might consider adding Zetia in the future.

## 2014-04-16 NOTE — Assessment & Plan Note (Signed)
Blood pressure is elevated today. Metoprolol was switched to carvedilol.

## 2014-04-16 NOTE — Assessment & Plan Note (Signed)
Most recent ejection fraction was 60%. Previously it was 35-40% before CABG. I switched metoprolol back to carvedilol for better blood pressure control and especially that she is diabetic. She appears to be euvolemic on small dose furosemide.

## 2014-04-19 ENCOUNTER — Other Ambulatory Visit: Payer: Self-pay

## 2014-04-19 MED ORDER — GLUCOSE BLOOD VI STRP: ORAL_STRIP | Status: DC

## 2014-04-19 MED ORDER — BLOOD GLUCOSE MONITOR KIT: PACK | Status: DC

## 2014-04-21 ENCOUNTER — Other Ambulatory Visit: Payer: Self-pay | Admitting: Nurse Practitioner

## 2014-04-23 ENCOUNTER — Other Ambulatory Visit: Payer: Self-pay | Admitting: Nurse Practitioner

## 2014-04-30 ENCOUNTER — Ambulatory Visit (INDEPENDENT_AMBULATORY_CARE_PROVIDER_SITE_OTHER): Payer: 59 | Admitting: Nurse Practitioner

## 2014-04-30 ENCOUNTER — Encounter: Payer: Self-pay | Admitting: Nurse Practitioner

## 2014-04-30 VITALS — BP 144/84 | HR 82 | Temp 98.5°F | Resp 14 | Ht 64.0 in | Wt 200.2 lb

## 2014-04-30 DIAGNOSIS — G479 Sleep disorder, unspecified: Secondary | ICD-10-CM

## 2014-04-30 DIAGNOSIS — E1140 Type 2 diabetes mellitus with diabetic neuropathy, unspecified: Secondary | ICD-10-CM

## 2014-04-30 DIAGNOSIS — E119 Type 2 diabetes mellitus without complications: Secondary | ICD-10-CM

## 2014-04-30 NOTE — Patient Instructions (Addendum)
Please visit the lab before leaving today.   See you for follow up in 3 month.

## 2014-04-30 NOTE — Progress Notes (Signed)
Subjective:    [name], female    DOB: 05/19/1955, 59 y.o.   MRN: [id number]  HPI  Ms. Pfund is a 59 yo female with a CC of medication refill.   1) Trazadone- helpful for sleep disturbances.   2) Wants to get out and walk  Lost 100 lbs in past  Does not eat enough--> sick  No eye exam  BS- 150's- 216  Wt Readings from Last 3 Encounters:  04/30/14 200 lb 4 oz (90.833 kg)  04/16/14 194 lb (87.998 kg)  03/30/14 181 lb 8 oz (82.328 kg)    Review of Systems  Constitutional: Negative for fever, chills, diaphoresis and fatigue.  Respiratory: Negative for chest tightness, shortness of breath and wheezing.   Cardiovascular: Negative for chest pain, palpitations and leg swelling.  Gastrointestinal: Negative for nausea, vomiting, diarrhea and rectal pain.  Endocrine: Negative for polydipsia, polyphagia and polyuria.  Skin: Negative for rash.  Neurological: Negative for dizziness, weakness, numbness and headaches.  Psychiatric/Behavioral: Positive for sleep disturbance. The patient is not nervous/anxious.    Past Medical History  Diagnosis Date  . Diabetes mellitus without complication   . Hypertension   . Hyperlipidemia   . Hypothyroidism   . Diabetic neuropathy   . Chronic systolic heart failure     a. Due to ischemic cardiomyopathy. EF as low as 35%, improved to normal s/p CABG; b. echo 07/06/13: EF 55-60%, no RWMAs, mod TR, trivial pericardial effusion not c/w tamponade physiology  . Anemia   . Coronary artery disease     a. NSTEMI 06/2013; b.cath: severe three-vessel CAD w/ EF 30% & mild-mod MR; c. s/p 3 vessel CABG 07/02/13 (LIMA-LAD, SVG-OM, and SVG-RPDA).    History   Social History  . Marital Status: Married    Spouse Name: N/A  . Number of Children: N/A  . Years of Education: N/A   Occupational History  . Not on file.   Social History Main Topics  . Smoking status: Never Smoker   . Smokeless tobacco: Never Used  . Alcohol Use: No  . Drug  Use: No  . Sexual Activity: Not on file   Other Topics Concern  . Not on file   Social History Narrative   Patient lives at home with her husband. Patient has 4 adult children.    Past Surgical History  Procedure Laterality Date  . Eye surgery      Cataract both eyes  . Coronary artery bypass graft N/A 07/02/2013    Procedure: CORONARY ARTERY BYPASS GRAFTING (CABG);  Surgeon: Ivin [name], MD;  Location: [place];  Service: Open Heart Surgery;  Laterality: N/A;  CABG x three, using left internal mammary artery and right leg greater saphenous vein harvested endoscopically  . Intraoperative transesophageal echocardiogram N/A 07/02/2013    Procedure: INTRAOPERATIVE TRANSESOPHAGEAL ECHOCARDIOGRAM;  Surgeon: Ivin [name], MD;  Location: [place];  Service: Open Heart Surgery;  Laterality: N/A;    Family History  Problem Relation Age of Onset  . COPD Mother   . Cancer Mother     Lung  . Pulmonary embolism Father     No Known Allergies  Current Outpatient Prescriptions on File Prior to Visit  Medication Sig Dispense Refill  . aspirin EC 325 MG EC tablet Take 1 tablet (325 mg total) by mouth daily. 30 tablet 0  . atorvastatin (LIPITOR) 80 MG tablet Take 1 tablet (80 mg total) by mouth daily. 90 tablet 3  . blood glucose  meter kit and supplies KIT Dispense based on patient and insurance preference. Use up to four times daily as directed. (FOR ICD-9 250.00, 250.01). 1 each 0  . carvedilol (COREG) 6.25 MG tablet Take 1 tablet (6.25 mg total) by mouth 2 (two) times daily. 180 tablet 3  . ferrous [id number] C-folic acid (TRINSICON / FOLTRIN) capsule Take 1 capsule by mouth every morning. For one month then stop. 30 capsule 0  . furosemide (LASIX) 20 MG tablet Take 1 tablet (20 mg total) by mouth daily. 90 tablet 3  . gabapentin (NEURONTIN) 300 MG capsule Take 1 capsule (300 mg total) by mouth 3 (three) times daily. 90 capsule 3  . glucose blood test strip Test 4 times daily Dx.  E11.9. One Touch ultra strips 100 each 12  . insulin NPH Human (HUMULIN N,NOVOLIN N) 100 UNIT/ML injection Inject 6 units with breakfast and 6 units with supper. 10 mL 11  . Insulin Syringe-Needle U-100 (INSULIN SYRINGE .5CC/30GX1/2") 30G X 1/2" 0.5 ML MISC Use as directed 100 each 5  . levothyroxine (SYNTHROID, LEVOTHROID) 50 MCG tablet TAKE ONE TABLET BY MOUTH ONCE DAILY 30 tablet 6  . lisinopril (PRINIVIL,ZESTRIL) 20 MG tablet Take 20 mg by mouth daily.    . metFORMIN (GLUCOPHAGE) 500 MG tablet Take 1 tablet (500 mg total) by mouth 2 (two) times daily with a meal. 60 tablet 5  . nitroGLYCERIN (NITROSTAT) 0.4 MG SL tablet Place 1 tablet (0.4 mg total) under the tongue every 5 (five) minutes as needed for chest pain. 25 tablet 3   No current facility-administered medications on file prior to visit.      Objective:   Physical Exam  Constitutional: She is oriented to person, place, and time. She appears well-developed and well-nourished. No distress.  BP 144/84 mmHg  Pulse 82  Temp(Src) 98.5 F (36.9 C) (Oral)  Resp 14  Ht $R'5\' 4"'wB$  (1.626 m)  Wt 200 lb 4 oz (90.833 kg)  BMI 34.36 kg/m2  SpO2 98%   HENT:  Head: Normocephalic and atraumatic.  Right Ear: External ear normal.  Left Ear: External ear normal.  Eyes: Right eye exhibits no discharge. Left eye exhibits no discharge. No scleral icterus.  Neck: Normal range of motion. Neck supple. No thyromegaly present.  Cardiovascular: Normal rate, regular rhythm, normal heart sounds and intact distal pulses.  Exam reveals no gallop and no friction rub.   No murmur heard. Pulmonary/Chest: Effort normal and breath sounds normal. No respiratory distress. She has no wheezes. She has no rales. She exhibits no tenderness.  Lymphadenopathy:    She has no cervical adenopathy.  Neurological: She is alert and oriented to person, place, and time. No cranial nerve deficit. She exhibits normal muscle tone. Coordination normal.  Skin: Skin is warm and dry.  No rash noted. She is not diaphoretic.  Psychiatric: She has a normal mood and affect. Her behavior is normal. Judgment and thought content normal.      Assessment & Plan:

## 2014-04-30 NOTE — Progress Notes (Signed)
Pre visit review using our clinic review tool, if applicable. No additional management support is needed unless otherwise documented below in the visit note. 

## 2014-05-01 ENCOUNTER — Other Ambulatory Visit: Payer: Self-pay | Admitting: Nurse Practitioner

## 2014-05-01 LAB — HEMOGLOBIN A1C
Hgb A1c MFr Bld: 9.4 % — ABNORMAL HIGH (ref <5.7)
Mean Plasma Glucose: 223 mg/dL — ABNORMAL HIGH (ref <117)

## 2014-05-01 LAB — MICROALBUMIN, URINE: Microalb, Ur: 163.6 mg/dL — ABNORMAL HIGH (ref <2.0)

## 2014-05-03 ENCOUNTER — Other Ambulatory Visit: Payer: Self-pay | Admitting: Nurse Practitioner

## 2014-05-03 DIAGNOSIS — E1165 Type 2 diabetes mellitus with hyperglycemia: Secondary | ICD-10-CM

## 2014-05-03 DIAGNOSIS — IMO0002 Reserved for concepts with insufficient information to code with codable children: Secondary | ICD-10-CM

## 2014-05-05 NOTE — Assessment & Plan Note (Signed)
Continue on Trazadone 50 mg for sleep.

## 2014-05-05 NOTE — Assessment & Plan Note (Signed)
Due for foot exam, eye exam, Microalbumin and A1c. Will ask her to visit ophthalmologist soon. Lab today for the others and foot exam reveals known peripheral neuropathy. 3 month follow up.

## 2014-05-30 ENCOUNTER — Other Ambulatory Visit: Payer: Self-pay | Admitting: Nurse Practitioner

## 2014-05-31 ENCOUNTER — Telehealth: Payer: Self-pay

## 2014-05-31 ENCOUNTER — Other Ambulatory Visit: Payer: Self-pay | Admitting: Nurse Practitioner

## 2014-05-31 ENCOUNTER — Other Ambulatory Visit: Payer: Self-pay

## 2014-05-31 MED ORDER — TRAZODONE HCL 50 MG PO TABS: 50.0000 mg | ORAL_TABLET | Freq: Every evening | ORAL | Status: DC | PRN

## 2014-05-31 NOTE — Telephone Encounter (Signed)
Pt requests Trazedone.  Last seen 04/30/14 and dispensed 15 tabs with no refills.  Please advise.

## 2014-05-31 NOTE — Telephone Encounter (Signed)
Trazodone 50 mg okay to refill #30 with 4 refills.

## 2014-06-07 ENCOUNTER — Other Ambulatory Visit: Payer: Self-pay | Admitting: Internal Medicine

## 2014-06-16 ENCOUNTER — Other Ambulatory Visit: Payer: Self-pay

## 2014-06-16 DIAGNOSIS — Z760 Encounter for issue of repeat prescription: Secondary | ICD-10-CM

## 2014-06-16 MED ORDER — LISINOPRIL 20 MG PO TABS: 20.0000 mg | ORAL_TABLET | Freq: Every day | ORAL | Status: DC

## 2014-06-17 ENCOUNTER — Encounter: Payer: Self-pay | Admitting: Endocrinology

## 2014-06-17 ENCOUNTER — Ambulatory Visit (INDEPENDENT_AMBULATORY_CARE_PROVIDER_SITE_OTHER): Payer: 59 | Admitting: Endocrinology

## 2014-06-17 VITALS — BP 140/92 | HR 73 | Resp 14 | Ht 64.0 in | Wt 199.2 lb

## 2014-06-17 DIAGNOSIS — E785 Hyperlipidemia, unspecified: Secondary | ICD-10-CM

## 2014-06-17 DIAGNOSIS — I10 Essential (primary) hypertension: Secondary | ICD-10-CM | POA: Diagnosis not present

## 2014-06-17 DIAGNOSIS — E1140 Type 2 diabetes mellitus with diabetic neuropathy, unspecified: Secondary | ICD-10-CM

## 2014-06-17 DIAGNOSIS — G629 Polyneuropathy, unspecified: Secondary | ICD-10-CM

## 2014-06-17 DIAGNOSIS — E039 Hypothyroidism, unspecified: Secondary | ICD-10-CM | POA: Diagnosis not present

## 2014-06-17 LAB — HM DIABETES FOOT EXAM: HM Diabetic Foot Exam: ABNORMAL

## 2014-06-17 MED ORDER — INSULIN ASPART PROT & ASPART (70-30 MIX) 100 UNIT/ML PEN: 8.0000 [IU] | PEN_INJECTOR | Freq: Two times a day (BID) | SUBCUTANEOUS | Status: DC

## 2014-06-17 NOTE — Patient Instructions (Signed)
Check sugars 2 x daily ( before breakfast and before supper).  Record them in a log book and bring that/meter to next appointment.   Stop NPH insulin.  Start Novolog mix 70/30 at 8 units twice daily ( with BF and supper). If you skip a meal then dont take that shot of insulin. Labs today. Will base on this whether we can continue metformin.   Take your thyroid pill separate from other pills, in the morning and wait for 30 min prior to eating anything.  Please come back for a follow-up appointment in 1 month.

## 2014-06-17 NOTE — Progress Notes (Signed)
Reason for visit-  Jamie Arias is a 59 y.o.-year-old female, referred by her PCP,  Doss, Velora Heckler, NP for management of Type 2 diabetes, uncontrolled, with complications ( neuropathy). Associated comorbidities of  CAD s/p NSTEMI s/p CABG, chronic systolic heart failure on diuretics and Stage 3 CKD. Here with husband.   HPI- Patient has been diagnosed with diabetes in 2003/2004. Recalls being initially on lifestyle modifications.  Tried  Metformin, Actos. she has been on insulin since 2013. She has tried levemir and regular insulin before.   * lack of steady of insurance over 2015>>insulin regimens based on samples  Pt is currently on a regimen of: - Metformin 500 mg po bid - Novolon NPH insulin vial at 6 units twice daily  ( takes after Bf, supper)   Last hemoglobin A1c was: Lab Results  Component Value Date   HGBA1C 9.4* 04/30/2014   HGBA1C 6.8* 08/11/2013   HGBA1C 6.9* 06/29/2013     Pt checks her sugars 1 a day . Uses One Touch glucometer. By recall they are:  PREMEAL Breakfast Lunch Dinner Bedtime Overall  Glucose range: 150-325      Mean/median:        POST-MEAL PC Breakfast PC Lunch PC Dinner  Glucose range:     Mean/median:       Hypoglycemia-  No lows. Lowest sugar was n/a; she has hypoglycemia awareness at 70.   Dietary habits- eats three times daily. Tries to limit carbs, sweetened beverages, sodas, desserts.  Exercise- not steady- works in a [employer], quantity [employer] - steady [employer] Readings from Last 3 Encounters:  06/17/14 199 lb 4 oz (90.379 kg)  04/30/14 200 lb 4 oz (90.833 kg)  04/16/14 194 lb (87.998 kg)    Diabetes Complications-  Nephropathy- Yes, Stage3  CKD, last BUN/creatinine- GFR 36, GFR 1.55, Jan 2016 Lab Results  Component Value Date   BUN 29* 06/17/2014   CREATININE 1.25* 06/17/2014   Lab Results  Component Value Date   GFR 46.38* 06/17/2014   No results found for: MICRALBCREAT  Urine MA abnormal 04/2014     Retinopathy- Not  known, Last DEE was in several  Years ago, cataract b/l surgery 2014 Neuropathy- no numbness and tingling in )her feet. Known neuropathy and being treated with gabapentin.  Associated history - Has CAD . No prior stroke. Has hypothyroidism that is being treated with levothyroxine 50 mcg daily. her last TSH was  Taking it together with other pills and lunch Lab Results  Component Value Date   TSH 2.26 06/17/2014    Hyperlipidemia-  her last set of lipids were- Currently on lipitor 80 mg . Tolerating well.   Lab Results  Component Value Date   CHOL 182 04/13/2014   HDL 70 04/13/2014   LDLCALC 93 04/13/2014   LDLDIRECT 126.9 10/31/2012   TRIG 93 04/13/2014   CHOLHDL 2.7 12/18/2013    Blood Pressure/HTN- Patient's blood pressure is well controlled today on current regimen that includes ACE-I ( lisinopril).  Pt has FH of DM in father, PGF.  I have reviewed the patient's past medical history, family and social history, surgical history, medications and allergies.  Past Medical History  Diagnosis Date  . Diabetes mellitus without complication   . Hypertension   . Hyperlipidemia   . Hypothyroidism   . Diabetic neuropathy   . Chronic systolic heart failure     a. Due to ischemic cardiomyopathy. EF as low as 35%, improved to normal s/p CABG; b. echo 07/06/13:  EF 55-60%, no RWMAs, mod TR, trivial pericardial effusion not c/w tamponade physiology  . Anemia   . Coronary artery disease     a. NSTEMI 06/2013; b.cath: severe three-vessel CAD w/ EF 30% & mild-mod MR; c. s/p 3 vessel CABG 07/02/13 (LIMA-LAD, SVG-OM, and SVG-RPDA).   Past Surgical History  Procedure Laterality Date  . Eye surgery      Cataract both eyes  . Coronary artery bypass graft N/A 07/02/2013    Procedure: CORONARY ARTERY BYPASS GRAFTING (CABG);  Surgeon: Ivin [name], MD;  Location: [place];  Service: Open Heart Surgery;  Laterality: N/A;  CABG x three, using left internal mammary artery and right leg greater saphenous  vein harvested endoscopically  . Intraoperative transesophageal echocardiogram N/A 07/02/2013    Procedure: INTRAOPERATIVE TRANSESOPHAGEAL ECHOCARDIOGRAM;  Surgeon: Ivin [name], MD;  Location: [place];  Service: Open Heart Surgery;  Laterality: N/A;   Family History  Problem Relation Age of Onset  . COPD Mother   . Cancer Mother     Lung  . Pulmonary embolism Father    History   Social History  . Marital Status: Married    Spouse Name: N/A  . Number of Children: N/A  . Years of Education: N/A   Occupational History  . Not on file.   Social History Main Topics  . Smoking status: Never Smoker   . Smokeless tobacco: Never Used  . Alcohol Use: No  . Drug Use: No  . Sexual Activity: Not on file   Other Topics Concern  . Not on file   Social History Narrative   Patient lives at home with her husband. Patient has 4 adult children.   Current Outpatient Prescriptions on File Prior to Visit  Medication Sig Dispense Refill  . aspirin EC 325 MG EC tablet Take 1 tablet (325 mg total) by mouth daily. 30 tablet 0  . blood glucose meter kit and supplies KIT Dispense based on patient and insurance preference. Use up to four times daily as directed. (FOR ICD-9 250.00, 250.01). 1 each 0  . carvedilol (COREG) 6.25 MG tablet Take 1 tablet (6.25 mg total) by mouth 2 (two) times daily. 180 tablet 3  . furosemide (LASIX) 20 MG tablet Take 1 tablet (20 mg total) by mouth daily. 90 tablet 3  . gabapentin (NEURONTIN) 300 MG capsule TAKE ONE CAPSULE BY MOUTH THREE TIMES DAILY 90 capsule 6  . glucose blood test strip Test 4 times daily Dx. E11.9. One Touch ultra strips 100 each 12  . Insulin Syringe-Needle U-100 (INSULIN SYRINGE .5CC/30GX1/2") 30G X 1/2" 0.5 ML MISC Use as directed 100 each 5  . levothyroxine (SYNTHROID, LEVOTHROID) 50 MCG tablet TAKE ONE TABLET BY MOUTH ONCE DAILY 30 tablet 6  . lisinopril (PRINIVIL,ZESTRIL) 20 MG tablet Take 1 tablet (20 mg total) by mouth daily. 30 tablet 1  .  metFORMIN (GLUCOPHAGE) 500 MG tablet Take 1 tablet (500 mg total) by mouth 2 (two) times daily with a meal. 60 tablet 5  . nitroGLYCERIN (NITROSTAT) 0.4 MG SL tablet Place 1 tablet (0.4 mg total) under the tongue every 5 (five) minutes as needed for chest pain. 25 tablet 3  . traZODone (DESYREL) 50 MG tablet Take 1 tablet (50 mg total) by mouth at bedtime as needed for sleep. 30 tablet 4   No current facility-administered medications on file prior to visit.   No Known Allergies   Review of Systems: $RemoveBef'[x]'iOdRxjUgur$  complains of  [  ] denies General:   [  x] Recent weight change [  ] Fatigue  [  ] Loss of appetite Eyes: [  ]  Vision Difficulty [  ]  Eye pain ENT: [  ]  Hearing difficulty [  ]  Difficulty Swallowing CVS: [  ] Chest pain [  ]  Palpitations/Irregular Heart beat [  ]  Shortness of breath lying flat [  ] Swelling of legs Resp: [x  ] Frequent Cough [  ] Shortness of Breath  [  ]  Wheezing GI: [  ] Heartburn  [  ] Nausea or Vomiting  [  ] Diarrhea [  ] Constipation  [  ] Abdominal Pain GU: [  ]  Polyuria  [  ]  nocturia Bones/joints:  [  ]  Muscle aches  [  ] Joint Pain  [  ] Bone pain Skin/Hair/Nails: [  ]  Rash  [  ] New stretch marks [  ]  Itching [  ] Hair loss [  ]  Excessive hair growth Reproduction: [  ] Low sexual desire , [  ]  Women: Menstrual cycle problems [  ]  Women: Breast Discharge [  ] Men: Difficulty with erections [  ]  Men: Enlarged Breasts CNS: [  ] Frequent Headaches [  ] Blurry vision [  ] Tremors [  ] Seizures [  ] Loss of consciousness [  ] Localized weakness Endocrine: [  ]  Excess thirst [  ]  Feeling excessively hot [  ]  Feeling excessively cold Heme: [  ]  Easy bruising [  ]  Enlarged glands or lumps in neck Allergy: [  ]  Food allergies [  ] Environmental allergies  PE: BP 140/92 mmHg  Pulse 73  Resp 14  Ht $R'5\' 4"'BE$  (1.626 m)  Wt 199 lb 4 oz (90.379 kg)  BMI 34.18 kg/m2  SpO2 97% Wt Readings from Last 3 Encounters:  06/17/14 199 lb 4 oz (90.379 kg)  04/30/14  200 lb 4 oz (90.833 kg)  04/16/14 194 lb (87.998 kg)   GENERAL: No acute distress, well developed HEENT:  Eye exam shows normal external appearance. Oral exam shows normal mucosa .  NECK:   Neck exam shows no lymphadenopathy. right Carotids bruits, ?murmur radiating. Thyroid is not enlarged and no nodules felt.  no acanthosis nigricans LUNGS:         Chest is symmetrical. Lungs are clear to auscultation.Marland Kitchen   HEART:         Heart sounds:  S1 and S2 are normal. Systolic murmur, but no clicks heard. ABDOMEN:  No Distention present. Liver and spleen are not palpable. No other mass or tenderness present.  EXTREMITIES:     There is 1+ edema. 2+ DP pulses  NEUROLOGICAL:     Grossly intact.            Diabetic foot exam done with shoes and socks removed: patchy loss to decrease of Normal Monofilament testing bilaterally. No deformities of toes.  Nails  Not dystrophic. Skin normal color. No open wounds. Dry skin.  MUSCULOSKELETAL:       There is no enlargement or gross deformity of the joints.  SKIN:       No rash  ASSESSMENT AND PLAN: Problem List Items Addressed This Visit      Cardiovascular and Mediastinum   HTN (hypertension)    BP at target on current regimen. MA abnormal 04/2014. Is on ACE-I      Relevant Orders  Comprehensive metabolic panel (Completed)     Endocrine   Hypothyroid    Appears clinically euthyroid today. Informed her about correct administration of levothyroxine. Update thyroid test today.      Relevant Orders   TSH (Completed)   T4, free (Completed)   Diabetes mellitus, type 2 - Primary    Discussed goal sugars and R0P, long term complications of uncontrolled DM, home glucose monitoring freq.  Discussed eye exams, foot care, hypoglycemia.  Eye exam recommended.  -Discussed various insulin regimens- feel that insulin is the best option for her at this time given her comorbidities and GFR. -We discussed about metformin use and continuing it only if GFR is in  accepatble range- will update CMP today -The patient has elected to try premixed insulin- will ask her to stop NPH insulin and start 70/30 insulin at 8 units twice daily ( to be taken with BF and supper). -She will check her sugars 2-3 x daily and report me the readings next week for further adjustments.  - RTC 29month      Relevant Medications   insulin aspart protamine - aspart (NOVOLOG MIX 70/30 FLEXPEN) (70-30) 100 UNIT/ML FlexPen   Other Relevant Orders   Comprehensive metabolic panel (Completed)   TSH (Completed)   T4, free (Completed)     Nervous and Auditory   Neuropathy    Discussed regular self foot care. Continue gabapentin. Symptoms controlled on this medication.         Other   HLD (hyperlipidemia)    On statin- managed by cardiology. Recent LDL <100, but could be better given her CAD           - Return to clinic in 1 mo with sugar log/meter.  Christen Bedoya [facility] 06/18/2014 3:31 PM

## 2014-06-17 NOTE — Progress Notes (Signed)
Pre visit review using our clinic review tool, if applicable. No additional management support is needed unless otherwise documented below in the visit note. 

## 2014-06-18 ENCOUNTER — Telehealth: Payer: Self-pay

## 2014-06-18 LAB — COMPREHENSIVE METABOLIC PANEL
ALT: 21 U/L (ref 0–35)
AST: 14 U/L (ref 0–37)
Albumin: 3 g/dL — ABNORMAL LOW (ref 3.5–5.2)
Alkaline Phosphatase: 62 U/L (ref 39–117)
BUN: 29 mg/dL — ABNORMAL HIGH (ref 6–23)
CO2: 27 mEq/L (ref 19–32)
Calcium: 9.3 mg/dL (ref 8.4–10.5)
Chloride: 106 mEq/L (ref 96–112)
Creatinine, Ser: 1.25 mg/dL — ABNORMAL HIGH (ref 0.40–1.20)
GFR: 46.38 mL/min — ABNORMAL LOW (ref >60.00)
Glucose, Bld: 111 mg/dL — ABNORMAL HIGH (ref 70–99)
Potassium: 4.5 mEq/L (ref 3.5–5.1)
Sodium: 138 mEq/L (ref 135–145)
Total Bilirubin: 0.4 mg/dL (ref 0.2–1.2)
Total Protein: 6.2 g/dL (ref 6.0–8.3)

## 2014-06-18 LAB — T4, FREE: Free T4: 0.97 ng/dL (ref 0.60–1.60)

## 2014-06-18 LAB — TSH: TSH: 2.26 u[IU]/mL (ref 0.35–4.50)

## 2014-06-18 NOTE — Assessment & Plan Note (Signed)
On statin- managed by cardiology. Recent LDL <100, but could be better given her CAD

## 2014-06-18 NOTE — Telephone Encounter (Signed)
PA needed for Novolog mix 70/30 flexpen. PA completed on cover my meds. Awaiting response at this time.

## 2014-06-18 NOTE — Assessment & Plan Note (Signed)
Discussed goal sugars and 123456, long term complications of uncontrolled DM, home glucose monitoring freq.  Discussed eye exams, foot care, hypoglycemia.  Eye exam recommended.  -Discussed various insulin regimens- feel that insulin is the best option for her at this time given her comorbidities and GFR. -We discussed about metformin use and continuing it only if GFR is in accepatble range- will update CMP today -The patient has elected to try premixed insulin- will ask her to stop NPH insulin and start 70/30 insulin at 8 units twice daily ( to be taken with BF and supper). -She will check her sugars 2-3 x daily and report me the readings next week for further adjustments.  - RTC 27month

## 2014-06-18 NOTE — Assessment & Plan Note (Signed)
Appears clinically euthyroid today. Informed her about correct administration of levothyroxine. Update thyroid test today.

## 2014-06-18 NOTE — Assessment & Plan Note (Signed)
BP at target on current regimen. MA abnormal 04/2014. Is on ACE-I

## 2014-06-18 NOTE — Assessment & Plan Note (Signed)
Discussed regular self foot care. Continue gabapentin. Symptoms controlled on this medication.

## 2014-06-21 ENCOUNTER — Telehealth: Payer: Self-pay | Admitting: Nurse Practitioner

## 2014-06-21 NOTE — Telephone Encounter (Signed)
Pt's husband called to check on status of new medication. Advised on insurance requiring PA and that we are just waiting to hear back from her insurance,  verbalized understanding

## 2014-06-21 NOTE — Telephone Encounter (Signed)
error 

## 2014-06-22 ENCOUNTER — Other Ambulatory Visit: Payer: Self-pay

## 2014-06-22 ENCOUNTER — Telehealth: Payer: Self-pay | Admitting: Endocrinology

## 2014-06-22 MED ORDER — INSULIN PEN NEEDLE 32G X 6 MM MISC
Status: DC
Start: 2014-06-22 — End: 2015-06-20

## 2014-06-22 MED ORDER — INSULIN LISPRO PROT & LISPRO (75-25 MIX) 100 UNIT/ML KWIKPEN: PEN_INJECTOR | SUBCUTANEOUS | Status: DC

## 2014-06-22 NOTE — Telephone Encounter (Addendum)
PA denied, only approved if history of 3 month trial of Humalog 75/25 resulting in therapeutic failure, contraindication, or intolerance. Ok to change to Humalog 75/25 per Dr. Howell Rucks, same directions as Novolog 70/30. Left message for pt to return my call.

## 2014-06-22 NOTE — Telephone Encounter (Signed)
Pt notified and verbalized understanding.

## 2014-06-22 NOTE — Addendum Note (Signed)
Addended by: Wynonia Lawman E on: 06/22/2014 02:19 PM   Modules accepted: Orders, Medications

## 2014-06-26 NOTE — Discharge Summary (Signed)
PATIENT NAME:  Jamie Arias, Jamie Arias MR#:  [id number] DATE OF BIRTH:  04-12-1955  DATE OF ADMISSION:  07/18/2013 DATE OF DISCHARGE:  07/21/2013  ADMITTING PHYSICIAN:  Jamie Patricia, MD  DISCHARGING PHYSICIAN:  Jamie Lighter, MD  PRIMARY CARE PHYSICIAN: Jamie Arias, nurse practitioner at [facility] family team.   [place]: Cardiology consultation with Dr. Fletcher Anon and Dr. Rockey Situ.    DISCHARGE DIAGNOSES: 1.  Acute on chronic diastolic congestive heart failure exacerbation, ejection fraction of 50% 55%.  2.  Recurrent left pleural effusion from congestive heart failure, status post thoracentesis.  3.  Coronary artery disease, status post bypass graft surgery 2 weeks ago.  4.  Hypertension.  5.  Hyperlipidemia.  6.  Chronic anemia.  7.  Hypothyroidism.   8.  Diabetes mellitus.   DISCHARGE HOME MEDICATIONS:  1.  Metformin 500 mg p.o. b.i.d.  2.  Levemir 12 units subcutaneous once a day at bedtime.  3.  Gabapentin 300 mg 3 times a day.  4.  Simvastatin 10 mg p.o. at bedtime.  5.  Lexapro 10 mg daily.  6.  Aspirin 325 mg daily.  7.  Ferrous fumarate with vitamin 123456, vitamin C, folic acid 1 tablet once a day.  8.  Synthroid 50 mcg p.o. daily.  9.  Sotalol 80 mg a daily.  10.  Trazodone 25 mg at bedtime.  11.  Lasix 40 mg p.o. daily.  12.  Lisinopril 20 mg daily.   DISCHARGE DIET: Low-sodium, low-fat, low-cholesterol diet.   DISCHARGE ACTIVITY: As tolerated.    FOLLOWUP INSTRUCTIONS: 1.  Follow up with cardiology in 1 week.  2.  PCP followup in 2 weeks.  3.  Follow up with CHF clinic as per schedule.   LABORATORY, DIAGNOSTIC AND RADIOLOGICAL DATA PRIOR TO DISCHARGE:   1.  Chest x-ray on the day of discharge showing coronary bypass graft surgery changes, stable cardiomegaly, stable bibasilar atelectasis and left pleura effusion, no pneumothorax seen.  2.  Sodium 136, potassium 4.4, chloride 100, bicarbonate 34, BUN 36, creatinine 1.29, glucose 107 and calcium of  8.7.  3.  CT chest without contrast showing moderate, partly loculated pleural effusions with associated compressive atelectasis, pneumonia could also present in a similar fashion. Trace pericardial effusion and substernal fluid, status post bypass graft surgery changes seen.  4.  WBC 10.4, hemoglobin 9.0, hematocrit 27.0, platelet count is 258.   BRIEF HOSPITAL COURSE: [name] is a 59 year old Caucasian female with past medical history significant for hypertension, diabetes, hyperlipidemia, coronary artery disease, status post bypass graft surgery about 3 weeks ago at [facility] and also history of anemia, who presents to the hospital secondary to acute respiratory failure secondary to congestive heart failure exacerbation.  1.  Acute respiratory failure secondary to acute on chronic diastolic congestive heart failure exacerbation. The patient was diuresed with IV Lasix, however, changed over to p.o. Lasix at the time of discharge. She has recurrent left pleural effusion which is 1 of the complications seen after her bypass graft surgery. She actually required thoracentesis at [facility] and required another thoracentesis here since her Lasix was not completely getting rid of the pleural effusion. She was oxygen dependent she initially came in, however, improved and on room air. She is able to ambulate well without any changes. Her chest x-ray still shows some stable left pleural effusion which needs to be monitored as an outpatient.  2.  Coronary artery disease, status post bypass graft surgery, stable, followed by cardiology while  in the hospital. Continue her home medications which include aspirin, sotalol, Lasix, lisinopril and statin.  3.  Hyperlipidemia, continue statin.  4.  Hypothyroidism.  She is on Synthroid.   Her course has been otherwise uneventful in the hospital.   DISCHARGE CONDITION: Stable.   DISCHARGE DISPOSITION: Home.   TIME SPENT ON DISCHARGE: 45 minutes.    ____________________________ Jamie Lighter, MD rk:cs D: 07/21/2013 14:00:00 ET T: 07/21/2013 14:28:57 ET JOB#: [id number]  cc: Jamie Lighter, MD, <Dictator> Jamie Forward, FNP Mertie Clause. Fletcher Anon, MD Jamie Lighter MD ELECTRONICALLY SIGNED 07/30/2013 12:25

## 2014-06-26 NOTE — Consult Note (Signed)
Brief Consult Note: Diagnosis: NSTEMI, acute systolic heart failure.   Patient was seen by consultant.   Consult note dictated.   Comments: Recommend cardiac cath today.  Electronic Signatures: Kathlyn Sacramento (MD)  (Signed 27-Apr-15 13:58)  Authored: Brief Consult Note   Last Updated: 27-Apr-15 13:58 by Kathlyn Sacramento (MD)

## 2014-06-26 NOTE — Consult Note (Signed)
PATIENT NAME:  Jamie Arias, Jamie Arias MR#:  [id number] DATE OF BIRTH:  05/07/1955  DATE OF CONSULTATION:  06/29/2013  REFERRING PHYSICIAN:  Dr. Margaretmary Eddy CONSULTING PHYSICIAN:  Rogue Jury A. Fletcher Anon, MD  PRIMARY CARE PHYSICIAN:  Charolette Forward, FNP, with  Primary Care.   REASON FOR CONSULTATION: Myocardial infarction.   HISTORY OF PRESENT ILLNESS: This is a 58 year old pleasant female with past medical history of hypertension, hyperlipidemia and type 2 diabetes. She presented to the Emergency Room with severe shortness of breath, orthopnea and positional cough when she tries to lie down. She actually started having lower substernal chest pain on Friday which lasted for about one hour. She felt better by Saturday, but she noticed difficulty breathing at the time due to shortness of breath. She had a prolonged episode of lower substernal chest tightness last night. This morning, she became acutely dyspneic and presented to the Emergency Room where she was found to be significantly hypoxic with an oxygen saturation of 68%. BNP was 9000 and initial troponin was 2, which subsequently increased to 7.2. She was given IV Lasix with improvement in symptoms. She was diagnosed with pneumonia and started on Rocephin and Zithromax. However, the cough has been mostly dry. She has no fever. The patient was seen by me last year for dizziness. Echocardiogram at that time showed normal LV systolic function. She had no anginal symptoms at that time.   PAST MEDICAL HISTORY: Includes hypertension, type 2 diabetes, and hyperlipidemia.   PAST SURGICAL HISTORY: None.   ALLERGIES: No known drug allergies.   SOCIAL HISTORY: Negative for smoking, alcohol or recreational drug use.   FAMILY HISTORY: Remarkable for diabetes.   HOME MEDICATIONS:  Include simvastatin 10 mg daily, metoprolol once a day, gabapentin 300 mg once daily, insulin Lantus 12 units once daily and hydrochlorothiazide 12.5 mg once daily.  REVIEW OF SYSTEMS: A  10-point review of systems was performed. It is negative other than what is mentioned in the HPI.   PHYSICAL EXAMINATION: GENERAL: The patient appears to be older than her stated age. Currently, she is in no acute distress.  VITAL SIGNS: On presentation, temperature is 96.7, pulse 86, respiratory rate 18. Blood pressure is 173/90 and oxygen saturation is 93% on 3 liters nasal cannula.  HEENT: Normocephalic, atraumatic.  NECK: Mild JVD with no carotid bruits.  RESPIRATORY: Normal respiratory effort with a few bibasilar crackles.  CARDIOVASCULAR: Normal PMI. Normal S1 and S2 with no gallops or murmurs.  ABDOMEN: Benign, nontender and nondistended.  EXTREMITIES: No clubbing, cyanosis or edema.  SKIN: Warm and dry with no rash.  PSYCHIATRIC: She is alert, oriented x 3 with normal mood and affect.   LABORATORY, DIAGNOSTIC, AND RADIOLOGICAL DATA: Chest x-ray showed vascular congestion with mild cardiomegaly and small pleural effusion. BNP was 9000. BUN was 37 with a creatinine of 1.09. Troponin was 2 and subsequently increased to 7.2. CK-MB was 15.  ECG showed sinus rhythm with possible old inferior infarct and poor R wave progression in the anterior leads.   IMPRESSION: 1.  Acute systolic heart failure, likely due to ischemic cardiomyopathy.  2.  Non-ST-elevation myocardial infarction.  3.  Hypertension.  4.  Hyperlipidemia.  5.  Type 2 diabetes.   RECOMMENDATIONS: The patient's history is very concerning. Based on her symptoms, I suspect that she had myocardial infarction on Friday. That was the day that she started having chest discomfort, but she did not seek medical attention. After a bath, she started having symptoms highly suggestive of new onset  heart failure. She also reports recurrent severe tightness last night, which is concerning for ongoing ischemic event. I proceeded with an echocardiogram which showed an ejection fraction of 35% to 40% with severe inferior, inferoseptal,  inferolateral hypokinesis with moderate mitral regurgitation, aortic sclerosis without significant stenosis and moderately elevated pulmonary pressure. She has improved significantly with IV Lasix and currently she is no longer orthopneic. Thus, I think the best option is to proceed with urgent cardiac catheterization and possible coronary intervention. Risks, benefits and alternatives were discussed with the patient. Further recommendations to follow after cardiac catheterization. I do not think that this patient has pneumonia. The cough and dyspnea is likely related to heart failure.   ____________________________ Mertie Clause. Fletcher Anon, MD maa:ce D: 06/29/2013 17:26:32 ET T: 06/29/2013 17:41:57 ET JOB#: [id number]  cc: Rogue Jury A. Fletcher Anon, MD, <Dictator> Nicholes Mango, MD Charolette Forward, FNP [facility] Ferne Reus MD ELECTRONICALLY SIGNED 07/15/2013 22:28

## 2014-06-26 NOTE — Consult Note (Signed)
General Aspect 59 year old female with known history of hypertension, hyperlipidemia and diabetes, CAD, recent CABG at [facility] 2 weeks ago, pleural effusion s/p thoracentesis, presentign with nausea, malaise, diarrhea. Cardiology was consulted for SOB, s/p CABG.   status post non-STEMI at [facility] 2 weeks ago. The patient had CABG done on 07/02/2013 at [facility].  Her hospital stay there was complicated by anemia, requiring 1 unit of blood transfusion, and atrial fibrillation,  reverted back to normal sinus rhythm.   The patient presents with complaint of nausea, malaise and diarrhea starting yesterday. Poor po intake since then. Notes indicate she was hypoxic, 89% on room air with SOG in the ER.  Reports she has been compliant with Lasix.  She denies any chest pain.   The patient's labs were significant for elevated BNP at 13,000, baseline was 9000.  chest x-ray did show evidence of pleural effusion, moderate on the left.  She had echo done on May 4th at [facility] showing EF of 55%, with no wall thickening.   This AM with significant nausea, anorexia. General malaise. Vomiting.  cardiac enz negative. x2.  SHe has had zofran, phenergen, still with vomiting  PAST MEDICAL HISTORY:  1. Hypertension.  2. Diabetes.  3. Hyperlipidemia.  4. Coronary artery disease, status post CABG.  5. Anemia, postop.   PAST SURGICAL HISTORY: CABG.   ALLERGIES:  No known drug allergies.   SOCIAL HISTORY:  Lives at home with her husband. No smoking. No alcohol. No illicit drug use.   FAMILY HISTORY:  Significant for diabetes.   Physical Exam:  GEN well developed, well nourished, critically ill appearing   HEENT hearing intact to voice, moist oral mucosa   NECK supple   RESP normal resp effort  clear BS   CARD Regular rate and rhythm  No murmur   ABD denies tenderness  soft   LYMPH negative neck   EXTR negative edema   SKIN normal to palpation   NEURO motor/sensory function intact    PSYCH alert, A+O to time, place, person, good insight     mi:    neuropathy:    Hypercholesterolemia:    Hypertension:    Diabetes:    cabg:   Home Medications: Medication Instructions Status  metFORMIN 500 mg oral tablet 1 tab(s) orally 2 times a day Active  Levemir 100 units/mL subcutaneous solution 12  subcutaneous once a day (at bedtime) Active  gabapentin 300 mg oral capsule  orally 3 times a day Active  simvastatin 10 mg oral tablet 1 tab(s) orally once a day (at bedtime) Active  escitalopram 10 mg oral tablet 1 tab(s) orally once a day Active  aspirin 325 mg oral tablet 1 tab(s) orally once a day Active  Lasix 40  orally once a day for 10 days Active  ferrous fumarate- A21- vitamin c- folic acid   once a day Active  levothyroxine 50 mcg (0.05 mg) oral tablet 1 tab(s) orally once a day Active  sotalol 80 mg oral tablet 1 tab(s) orally once a day Active  traZODone 50 mg oral tablet .5 tab(s) orally once a day (at bedtime) Active   Lab Results:  Routine Chem:  16-May-15 04:03   Result Comment TROPONIN - RESULTS VERIFIED BY REPEAT TESTING.  - C/BRANDY DAVIS AT 3086 07/18/13.PMH  - READ-BACK PROCESS PERFORMED.  Result(s) reported on 18 Jul 2013 at 05:23AM.  Glucose, Serum  176  BUN  25  Creatinine (comp) 1.17  Sodium, Serum 139  Potassium, Serum  3.8  Chloride, Serum 104  CO2, Serum 28  Calcium (Total), Serum 9.1  Anion Gap 7  Osmolality (calc) 286  eGFR (African American)  59  eGFR (Non-African American)  51 (eGFR values <56m/min/1.73 m2 may be an indication of chronic kidney disease (CKD). Calculated eGFR is useful in patients with stable renal function. The eGFR calculation will not be reliable in acutely ill patients when serum creatinine is changing rapidly. It is not useful in  patients on dialysis. The eGFR calculation may not be applicable to patients at the low and high extremes of body sizes, pregnant women, and vegetarians.)  B-Type Natriuretic  Peptide ([facility]  [phone](Result(s) reported on 18 Jul 2013 at 05:13AM.)  Cardiac:  16-May-15 04:03   Troponin I  0.08 (0.00-0.05 0.05 ng/mL or less: NEGATIVE  Repeat testing in 3-6 hrs  if clinically indicated. >0.05 ng/mL: POTENTIAL  MYOCARDIAL INJURY. Repeat  testing in 3-6 hrs if  clinically indicated. NOTE: An increase or decrease  of 30% or more on serial  testing suggests a  clinically important change)  CK, Total 93 (26-192 NOTE: NEW REFERENCE RANGE  04/06/2013)  CPK-MB, Serum 2.3 (Result(s) reported on 18 Jul 2013 at 08:17AM.)    08:16   Troponin I  0.07 (0.00-0.05 0.05 ng/mL or less: NEGATIVE  Repeat testing in 3-6 hrs  if clinically indicated. >0.05 ng/mL: POTENTIAL  MYOCARDIAL INJURY. Repeat  testing in 3-6 hrs if  clinically indicated. NOTE: An increase or decrease  of 30% or more on serial  testing suggests a  clinically important change)  Routine Hem:  16-May-15 04:03   WBC (CBC) 7.6  RBC (CBC)  3.69  Hemoglobin (CBC)  10.5  Hematocrit (CBC)  32.0  Platelet Count (CBC) 271 (Result(s) reported on 18 Jul 2013 at 04:42AM.)  MCV 87  MCH 28.4  MCHC 32.8  RDW  15.0   Radiology Results: XRay:    16-May-15 04:33, Chest Portable Single View  Chest Portable Single View   REASON FOR EXAM:    nausea, 2 weeks post cabg  COMMENTS:       PROCEDURE: DXR - DXR PORTABLE CHEST SINGLE VIEW  - Jul 18 2013  4:33AM     CLINICAL DATA:  Nausea.  Status post CABG 2 weeks ago.    EXAM:  PORTABLE CHEST - 1 VIEW    COMPARISON:  07/10/2013.    FINDINGS:  Progressive enlargement of the cardiac silhouette. Increased left  pleural fluid. Increased ill-defined and linear density at the right  lung base. Increased density at the left lung base. Stable post CABG  changes.     IMPRESSION:  1. Moderate-sized left pleural effusion, mildly increased.  2. Mild increase in a probable small right pleural effusion.  3. Increased bibasilar atelectasis/pneumonia.  4. Stable  cardiomegaly.      Electronically Signed    By: SEnrique SackM.D.    On:07/18/2013 04:38       Verified By: SGerald Stabs M.D.,    No Known Allergies:   Vital Signs/Nurse's Notes: **Vital Signs.:   16-May-15 12:03  Vital Signs Type Routine  Temperature Temperature (F) 98  Celsius 36.6  Temperature Source oral  Pulse Pulse 71  Respirations Respirations 20  Systolic BP Systolic BP 1540 Diastolic BP (mmHg) Diastolic BP (mmHg) 84  Mean BP 120  Pulse Ox % Pulse Ox % 88  Oxygen Delivery 2L    Impression 1.Nausea/vomiting: possible gastroenteritis receiving zofran and phenergen, still the N/V May need  to start IVF if no relief soon. Will hold lasix given minimal PO intake cardiac enz neg x 2, 3rd pending  2) SOB: likely secondary to moderate sized left pleural effusion, diastolic CHF. lasix on hold this Am as she has minimal Po intake for N/V  3) CAD, s/p recent CABG: recent non-ST-elevation myocardial infarction. She is on aspirin,  statin, sotalol. troponin of 0.08 x 2, around baseline  4. Hypertension,  Will continue to monitor.  not tolerating po meds secondary to N/V this AM order for Hydralazine IV PRN for SBP >160 placed  5. Diabetes mellitus.   metformin on hold,  on Levemir, insulin sliding scale.   6. Hyperlipidemia.  on a  statin.   7. Hypothyroidism.   Synthroid.   8. Anemia with anemia of blood loss post surgery,  Continue with iron supplement.   9. Deep vein thrombosis prophylaxis.  Subcutaneous heparin, TEDS   Electronic Signatures: Ida Rogue (MD)  (Signed 16-May-15 13:02)  Authored: General Aspect/Present Illness, History and Physical Exam, Past Medical History, Home Medications, Labs, Radiology, Allergies, Vital Signs/Nurse's Notes, Impression/Plan   Last Updated: 16-May-15 13:02 by Ida Rogue (MD)

## 2014-06-26 NOTE — Discharge Summary (Signed)
PATIENT NAME:  Jamie Arias, Jamie Arias MR#:  [id number] DATE OF BIRTH:  1955/06/25  DATE OF ADMISSION:  06/29/2013 DATE OF DISCHARGE:  06/29/2013  ADMITTING DIAGNOSIS: Chest pain, acute respiratory failure.   DISCHARGE DIAGNOSES: 1.  Chest pain due to acute non-ST myocardial infarction status post cardiac catheterization with 3 vessel disease, is being transferred to [facility] for further bypass.  2.  Acute systolic congestive heart failure related to her myocardial infarction and treated with Lasix.  3.  Hypertension. 4.  Diabetes. 5.  Hyperlipidemia.   PERTINENT LABS AND EVALUATIONS: Chest x-ray showed mild cardiomegaly with bilateral frontal airspace opacities compatible with pulmonary edema. Small bilateral pleural effusion.   BNP was 9156. Glucose 245, BUN 37, creatinine 1.09. LFTs were normal, except albumin of 3.3. CPK was 223, CK-MB 8.8. Troponin 2.0.   EKG: Normal sinus rhythm with no ST-T wave changes.   Cardiac catheterization: Please see the official report. Findings consistent with severe 3 vessel disease.   HOSPITAL COURSE: Please refer to H and P done by the admitting physician. The patient is a 59 year old white female who presented with chest pain as well as shortness of breath. She was seen in the ED and was diagnosed with non-ST MI as well as possible CHF. Her troponin started increasing and she was taken to cardiac catheterization by Dr. Fletcher Anon. He found her to have severe 3 vessel disease, therefore, he has made arrangements for her to be transferred to [facility]. Her EF was decreased at 35% as well. At this time, the patient will be transferred to [facility] for cardiac bypass.   DISCHARGE MEDICATIONS: Gabapentin 300 at bedtime, insulin Levemir 12 units at bedtime, sliding scale insulin, lisinopril 40 daily, metoprolol 25 daily, simvastatin 10 at bedtime, ranitidine 150 q. 12, trazodone 100 at bedtime.   DISPOSITION: To [name] for further bypass and treatment.   TIME  SPENT ON DISCHARGE: 35 minutes.   ____________________________ Jamie Mosses Posey Pronto, MD shp:sb D: 06/29/2013 16:32:00 ET T: 06/29/2013 17:05:12 ET JOB#: [id number]  cc: Jamie Tunison H. Posey Pronto, MD, <Dictator> Alric Seton MD ELECTRONICALLY SIGNED 06/30/2013 8:14

## 2014-06-26 NOTE — H&P (Signed)
PATIENT NAME:  Jamie Arias, Jamie Arias MR#:  [id number] DATE OF BIRTH:  07-08-1955  DATE OF ADMISSION:  06/29/2013  PRIMARY CARE PHYSICIAN: Nonlocal.  REFERRING  EMERGENCY ROOM  PHYSICIAN: Dr. Beather Arbour.   CHIEF COMPLAINT: Cough and chest pain.   HISTORY OF PRESENT ILLNESS: The patient is a 59 year old pleasant Caucasian female with past medical history of hypertension, diabetes mellitus and hyperlipidemia, presenting to the ER with a chief complaint of shortness of breath associated with productive cough for three days.  The patient is bringing up clear phlegm. She is unable to lie flat and extremely short of breath last night. She was cyanotic by the time she came into the ER and pulse oximetry was at 68%. No fever, but complaining of dizziness. The patient was immediately placed on BiPAP and subsequently she regained her color. The patient's white count is normal. Troponin is elevated at 2.00. BNP is elevated at 9156. Portable chest x-ray has revealed vascular congestion with bilateral central airspace opacities compatible with pulmonary edema. Blood cultures were obtained. The patient was started on IV Rocephin and Zithromax and Lasix was given for pulmonary edema. The patient is complaining of chest tightness. Troponin being elevated. She was diagnosed with Non-STEMI and heparin drip was initiated. Hospitalist team is called to admit the patient.   PAST MEDICAL HISTORY: Hypertension, diabetes mellitus. Hyperlipidemia.   PAST SURGICAL HISTORY: None.   ALLERGIES: No known drug allergies.   PSYCHOSOCIAL HISTORY: Lives at home with husband. Denies smoking, alcohol or illicit drug usage.   FAMILY HISTORY: Father has history of diabetes mellitus.   HOME MEDICATIONS:  simvastatin 10 mg p.o. once daily, metoprolol succinate 1 tablet p.o. once daily, Gabapentin 300 mg p.o. once a day,  Lantus 12 units subcutaneously once daily, hydrochlorothiazide 12.5 mg p.o. once daily.   REVIEW OF SYSTEMS:  CONSTITUTIONAL:  Denies any fever. Complaining of fatigue, weakness and shortness of breath.  EYES: Denies blurry vision, double vision, glaucoma.  ENT: Denies epistaxis, discharge. No tinnitus.  RESPIRATION: Complaining of productive cough. No hemoptysis. Complaining of shortness of breath and could not lie flat.  CARDIOVASCULAR: No chest pain, palpitations. Denies syncope. Complaining of dizziness.  GASTROINTESTINAL: Denies nausea, vomiting, diarrhea, abdominal pain.  GENITOURINARY: No dysuria, hematuria.  BREAST/UTERUS Denies breast mass or vaginal discharge.  ENDOCRINE:  Denies polyuria, nocturia, thyroid problems.  HEMATOLOGIC AND LYMPHATIC: Denies anemia, easy bruising, bleeding.  INTEGUMENTARY: No acne, rash, lesions.  MUSCULOSKELETAL: No joint pain in the neck and back. Denies gout.  NEUROLOGIC: Denies vertigo, ataxia, CVA.  PSYCHIATRIC: No ADD or OCD. Denies insomnia.   PHYSICAL EXAMINATION:  VITAL SIGNS: Temperature 96.7, pulse 86, respirations 18, blood pressure 173/90, pulse oximetry 98%.  GENERAL APPEARANCE: Not in any acute distress. Moderately built and nourished.  HEENT: Normocephalic, atraumatic. Pupils are equally reacting to light and accommodation. No scleral icterus. No conjunctival injection. No sinus tenderness. Moist mucous membranes. The patient is on BiPAP.  NECK: Supple. No JVD. No thyromegaly. Range of motion is intact.  LUNGS: Has crackles and rales. Moderate air entry. CARDIOVASCULAR: S1, S2 normal. Regular rate and rhythm. No murmurs.  GASTROINTESTINAL: Soft. Bowel sounds are positive in all four quadrants. Nontender, nondistended. No hepatosplenomegaly. No masses felt.  NEUROLOGIC: Awake, alert, oriented x 3. Motor and sensory are grossly intact. Reflexes are 2+.  EXTREMITIES: No edema. No cyanosis. No clubbing.  SKIN: Warm to touch. Normal turgor. No rashes. No lesions.  MUSCULOSKELETAL: No joint effusion, tenderness, erythema.  PSYCHIATRIC: Normal mood and affect.  LABORATORIES AND IMAGING STUDIES: Portable chest x-ray vascular congestion and mild cardiomegaly with bilateral frontal airspace opacities compatible with pulmonary edema. Small bilateral pleural effusion noted. BNP 9156, glucose 245, BUN 37, creatinine 1.09. Sodium, potassium, chloride and CO2 are normal, GFR 56, anion gap, serum osmolality, calcium are normal. Lipase is normal. LFTs are normal except albumin at 3.3. CK total 223. CK-MB 8.8. Troponin 2.0. CBC is normal. Activated PTT 28.3, PH 7.36, pCO2 38, pO2 218 on 100% FiO2, bicarbonate is 21.5. Lactic acid is 1.2. The patient is on PEEP of 6 with mechanical rate at 8. A 12-lead EKG has revealed normal sinus rhythm at 90 beats per minute, normal PR and QRS intervals, chronic infarct, no acute ST-T wave changes.   ASSESSMENT AND PLAN: A 59 year old female presenting to the ER with a chief complaint of productive cough for three days and unable to lie flat, will be admitted with the following assessment and plan.  1. Acute hypoxic respiratory failure, probably from pneumonia and pulmonary edema. The patient is placed on BiPAP and continue IV Lasix and antibiotics. Cardiology consult is placed to Dr. Fletcher Anon.  2. Non-STEMI. The patient is on heparin drip. Cardiology consult is placed.  3. Hypertension. We will resume her home medication metoprolol and hydrochlorothiazide.  4. Diabetes mellitus. The patient will be on Lantus and sliding scale insulin.  5. Hyperlipidemia. Continue statin.  6. We will provide gastrointestinal and deep vein thrombosis prophylaxis with heparin drip.   Diagnosis and plan of care was discussed in detail with the patient and her husband at bedside. They both verbalized understanding of the plan. She is full code. Husband is her medical power of attorney.   TOTAL TIME SPENT ON ADMISSION: Is 50 minutes.     ____________________________ Nicholes Mango, MD ag:sg D: 06/29/2013 08:16:05 ET T: 06/29/2013 09:47:00  ET JOB#: [id number]  cc: Nicholes Mango, MD, <Dictator> Nicholes Mango MD ELECTRONICALLY SIGNED 07/09/2013 3:53

## 2014-06-26 NOTE — H&P (Signed)
PATIENT NAME:  Jamie Arias, FLAKE MR#:  [id number] DATE OF BIRTH:  Jul 18, 1955  DATE OF ADMISSION:  07/18/2013  REFERRING PHYSICIAN: Loney Hering, MD  PRIMARY CARE PHYSICIAN: Charolette Forward, FNP  PRIMARY CARDIOLOGIST: Mertie Clause. Fletcher Anon, MD   CHIEF COMPLAINT: Shortness of breath and nausea.   HISTORY OF PRESENT ILLNESS: This is a 59 year old female with known history of hypertension, hyperlipidemia and diabetes. The patient recently had CABG at [facility], status post non-STEMI at [facility]. The patient had CABG done on 07/02/2013 at [facility] Her hospital stay there was complicated by anemia, requiring 1 unit of blood transfusion, and atrial fibrillation, which currently now reverted back to normal sinus rhythm. The patient presents today with complaint of nausea, as well for the last night, as well, reported Shortness of breath. The patient was noticed to be hypoxic, 89% on room air. The patient did report she has been feeling shortness of breath as well. The patient was discharged on Lasix on May 8th from [facility]. Reports she has been compliant with Lasix, but as well reports low salt intake. She reports she is adding some salt to her diet, but not as much. She denies any chest pain. The patient's labs were significant for elevated BNP at 13,000, baseline was 9000. As well, the patient's chest x-ray did show evidence of pleural effusion, worsening than previous. As well, she did have worsening lower extremity edema. The patient had thoracocentesis done at [facility] of 950 mL of serosanguineous fluid. The patient reports much improvement of her symptoms after receiving IV Lasix. She had echo done on May 4th at [facility] showing EF of 55%, with no wall thickening.   PAST MEDICAL HISTORY:  1. Hypertension.  2. Diabetes.  3. Hyperlipidemia.  4. Coronary artery disease, status post CABG.  5. Anemia, postop.   PAST SURGICAL HISTORY: CABG.   ALLERGIES: No known drug allergies.    SOCIAL HISTORY: Lives at home with her husband. No smoking. No alcohol. No illicit drug use.   FAMILY HISTORY: Significant for diabetes.   HOME MEDICATIONS:  1. Lasix 40 mg oral daily.  2. Aspirin 325 mg daily. 3. Iron 1 tablet daily.  4. Gabapentin 300 mg oral 3 times a day.  5. Levemir at 12 units at bedtime.  6. Levothyroxine 50 mcg oral daily.  7. Sotalol 80 mg daily.  8. Trazodone 25 mg daily.  9. Simvastatin 10 mg daily.  10. Metformin 500 mg daily.   REVIEW OF SYSTEMS:  CONSTITUTIONAL: Denies fever, chills, fatigue, weakness, weight gain, weight loss.  EYES: Denies blurry vision, double vision, inflammation.  ENT: Denies tinnitus, ear pain, hearing loss, epistaxis.  RESPIRATORY: Denies cough, wheezing, hemoptysis, COPD. Reports shortness of breath.  CARDIOVASCULAR: Denies chest pain, orthopnea, palpitations, syncope. Reports edema.  GASTROINTESTINAL: Denies diarrhea, abdominal pain, constipation, hematemesis, coffee-ground emesis. Reports nausea, but no vomiting.  GENITOURINARY: Denies dysuria, hematuria or renal colic.  ENDOCRINE: Denies polyuria or polydipsia, heat or cold intolerance.  HEMATOLOGY: Denies anemia, easy bruising or bleeding diathesis. INTEGUMENTARY: Denies acne, rash or skin lesion.  MUSCULOSKELETAL: Denies any joint effusion, gout, arthritis or cramps.  NEUROLOGIC: Denies CVA, TIA, tremors, vertigo, ataxia.  PSYCHIATRIC: Denies anxiety, insomnia or depression.   PHYSICAL EXAMINATION:  VITAL SIGNS: Temperature 97.7, pulse 67, respiratory rate 18, blood pressure 160/72, saturating 94% on 2 liters nasal cannula, was 89% on room air.  GENERAL: Well-nourished female, looks comfortable in bed, in no apparent distress.  HEENT: Head atraumatic, normocephalic. Pupils  equally reactive to light. Pink conjunctivae. Anicteric sclerae. Moist oral mucosa.  NECK: Supple. No thyromegaly. No JVD. No carotid bruits.  CHEST: Good air entry bilaterally. No wheezing, rales,  rhonchi. Had decreased air entry at the bases, most likely due to pleural effusion. Has bibasilar crackles.  CARDIOVASCULAR: S1, S2 heard. No rubs, murmurs or gallops.  ABDOMEN: Soft, nontender, nondistended. Bowel sounds present.  EXTREMITIES: +2 edema bilaterally. No clubbing. No cyanosis. Pedal and radial pulses felt bilaterally.  PSYCHIATRIC: Appropriate affect. Awake, alert x3. Intact judgment and insight.  NEUROLOGIC: Cranial nerves grossly intact. Motor 5 out of 5. No focal deficits.  SKIN: Normal skin turgor. Warm and dry. No rash. The patient has a healing wound in the right leg where they harvested her vein, healing nicely. No discharge. As well, the patient's chest wound from her CABG appears to be healing nicely. No discharge.  MUSCULOSKELETAL: No joint effusion or erythema.   IMAGING AND BLOOD WORK: Chest x-ray showing moderate-sized left pleural effusion, mildly increased, and increase in small right pleural effusion and increased bibasilar atelectasis/pneumonia. Stable cardiomegaly. Pertinent laboratories: BNP 13,883. Glucose 176, BUN 25, creatinine 1.17, sodium 139, potassium 3.8, chloride 104, CO2 28. Troponin 0.08. White blood cells 7.6, hemoglobin 10.5, hematocrit 32, platelets 271.   ASSESSMENT AND PLAN:  1. Congestive heart failure, appears to be new onset. The patient's most recent echo done before a week at [facility] was showing normal ejection fraction at 55% with no wall motion thickness. The patient will be admitted to telemetry unit. Will be started on diuresis. She was given IV Lasix 40 in ED. Will keep her on 20 IV b.i.d. Will continue to cycle her cardiac enzymes and will consult cardiology to see if we need to repeat echocardiogram given the fact that she just had one recently done.  2. History of coronary artery disease and recent non-ST-elevation myocardial infarction. The patient is status post coronary artery bypass grafting. She is on aspirin. She is on a statin. She is on  sotalol. The patient had mildly elevated troponin of 0.08, which is much improved than her baseline when she discharged before 2 weeks to [facility]. Will continue to cycle her cardiac enzymes. Continue on aspirin.  3. Hypertension, initially uncontrolled, currently much improved. Will continue her on home medication and will monitor.  4. Diabetes mellitus. Hold metformin. Continue Levemir at a lower dose. Add insulin sliding scale.  5. Hyperlipidemia. Continue with statin.  6. Hypothyroidism. Continue with Synthroid.  7. Anemia with anemia of blood loss post surgery, appears to be improving. Continue with iron supplement.  8. Deep vein thrombosis prophylaxis. Subcutaneous heparin.   CODE STATUS: The patient is full code.   TOTAL TIME SPENT ON ADMISSION AND PATIENT CARE: 50 minutes.   ____________________________ Albertine Patricia, MD dse:lb D: 07/18/2013 07:30:27 ET T: 07/18/2013 08:25:59 ET JOB#: [id number]  cc: Albertine Patricia, MD, <Dictator> Beretta Ginsberg Graciela Husbands MD ELECTRONICALLY SIGNED 07/18/2013 23:59

## 2014-06-27 NOTE — Op Note (Signed)
PATIENT NAME:  Jamie Arias, Jamie Arias MR#:  [id number] DATE OF BIRTH:  01-21-1956  DATE OF PROCEDURE:  03/05/2011  PREOPERATIVE DIAGNOSIS:  Cataract, right eye.   POSTOPERATIVE DIAGNOSIS:  Cataract, right eye.  PROCEDURE PERFORMED:  Extracapsular cataract extraction using phacoemulsification with placement of an Alcon [id number], 23.0-diopter posterior chamber lens, serial # [id number].  SURGEON:  Loura Back. Jaeceon Michelin, MD  ASSISTANT:  None.  ANESTHESIA:  4% lidocaine and 0.75% Marcaine in a 50/50 mixture with 10 units/mL of Vitrase added, given as a peribulbar.   ANESTHESIOLOGIST:  Vashti Hey, MD  COMPLICATIONS:  None.  ESTIMATED BLOOD LOSS:  Less than 1 ml.  DESCRIPTION OF PROCEDURE:  The patient was brought to the operating room and given a peribulbar block.  The patient was then prepped and draped in the usual fashion.  The vertical rectus muscles were imbricated using 5-0 silk sutures.  These sutures were then clamped to the sterile drapes as bridle sutures.  A limbal peritomy was performed extending two clock hours and hemostasis was obtained with cautery.  A partial thickness scleral groove was made at the surgical limbus and dissected anteriorly in a lamellar dissection using an Alcon crescent knife.  The anterior chamber was entered superonasally with a Superblade and through the lamellar dissection with a 2.6 mm keratome.  DisCoVisc was used to replace the aqueous and a continuous tear capsulorrhexis was carried out.  Hydrodissection and hydrodelineation were carried out with balanced salt and a 27 gauge canula.  The nucleus was rotated to confirm the effectiveness of the hydrodissection.  Phacoemulsification was carried out using a divide-and-conquer technique.  Total ultrasound time was 2 minutes and 13.6 seconds with an average power of 20.4 percent.  Irrigation/aspiration was used to remove the residual cortex.  DisCoVisc was used to inflate the capsule and the internal incision was  enlarged to 3 mm with the crescent knife.  The intraocular lens was folded and inserted into the capsular bag using the AcrySert delivery system. Irrigation/aspiration was used to remove the residual DisCoVisc.  Miostat was injected into the anterior chamber through the paracentesis track to inflate the anterior chamber and induce miosis.  The wound was checked for leaks and none were found. The conjunctiva was closed with cautery and the bridle sutures were removed.  Two drops of 0.3% Vigamox were placed on the eye.   An eye shield was placed on the eye.  The patient was discharged to the recovery room in good condition.  ____________________________ Loura Back Daxtin Leiker, MD sad:slb D: 03/05/2011 13:22:30 ET T: 03/05/2011 14:11:39 ET JOB#: [id number]  cc: Remo Lipps A. Earnestine Tuohey, MD, <Dictator> Martie Lee MD ELECTRONICALLY SIGNED 03/12/2011 12:48

## 2014-06-30 ENCOUNTER — Other Ambulatory Visit: Payer: Self-pay | Admitting: Internal Medicine

## 2014-07-20 ENCOUNTER — Ambulatory Visit: Payer: 59 | Admitting: Endocrinology

## 2014-08-09 ENCOUNTER — Other Ambulatory Visit: Payer: Self-pay | Admitting: Internal Medicine

## 2014-08-19 ENCOUNTER — Ambulatory Visit: Payer: 59 | Admitting: Endocrinology

## 2014-10-19 ENCOUNTER — Other Ambulatory Visit: Payer: Self-pay

## 2014-10-19 ENCOUNTER — Encounter: Payer: Self-pay | Admitting: Emergency Medicine

## 2014-10-19 ENCOUNTER — Emergency Department
Admission: EM | Admit: 2014-10-19 | Discharge: 2014-10-19 | Disposition: A | Discharge status: Home / Self Care | Payer: 59 | Attending: Emergency Medicine | Admitting: Emergency Medicine

## 2014-10-19 DIAGNOSIS — Z794 Long term (current) use of insulin: Secondary | ICD-10-CM | POA: Insufficient documentation

## 2014-10-19 DIAGNOSIS — R1013 Epigastric pain: Secondary | ICD-10-CM | POA: Diagnosis not present

## 2014-10-19 DIAGNOSIS — R112 Nausea with vomiting, unspecified: Secondary | ICD-10-CM | POA: Insufficient documentation

## 2014-10-19 DIAGNOSIS — E1140 Type 2 diabetes mellitus with diabetic neuropathy, unspecified: Secondary | ICD-10-CM | POA: Diagnosis not present

## 2014-10-19 DIAGNOSIS — I10 Essential (primary) hypertension: Secondary | ICD-10-CM | POA: Diagnosis not present

## 2014-10-19 DIAGNOSIS — E860 Dehydration: Secondary | ICD-10-CM | POA: Insufficient documentation

## 2014-10-19 DIAGNOSIS — Z79899 Other long term (current) drug therapy: Secondary | ICD-10-CM | POA: Insufficient documentation

## 2014-10-19 DIAGNOSIS — Z7982 Long term (current) use of aspirin: Secondary | ICD-10-CM | POA: Insufficient documentation

## 2014-10-19 LAB — COMPREHENSIVE METABOLIC PANEL
ALT: 14 U/L (ref 14–54)
AST: 22 U/L (ref 15–41)
Albumin: 3.3 g/dL — ABNORMAL LOW (ref 3.5–5.0)
Alkaline Phosphatase: 71 U/L (ref 38–126)
Anion gap: 11 (ref 5–15)
BUN: 23 mg/dL — ABNORMAL HIGH (ref 6–20)
CO2: 25 mmol/L (ref 22–32)
Calcium: 9.2 mg/dL (ref 8.9–10.3)
Chloride: 105 mmol/L (ref 101–111)
Creatinine, Ser: 1.57 mg/dL — ABNORMAL HIGH (ref 0.44–1.00)
GFR calc Af Amer: 40 mL/min — ABNORMAL LOW (ref >60)
GFR calc non Af Amer: 35 mL/min — ABNORMAL LOW (ref >60)
Glucose, Bld: 173 mg/dL — ABNORMAL HIGH (ref 65–99)
Potassium: 4 mmol/L (ref 3.5–5.1)
Sodium: 141 mmol/L (ref 135–145)
Total Bilirubin: 0.8 mg/dL (ref 0.3–1.2)
Total Protein: 7.3 g/dL (ref 6.5–8.1)

## 2014-10-19 LAB — URINALYSIS COMPLETE WITH MICROSCOPIC (ARMC ONLY)
Bacteria, UA: NONE SEEN
Bilirubin Urine: NEGATIVE
Glucose, UA: >500 mg/dL — AB
Hgb urine dipstick: NEGATIVE
Leukocytes, UA: NEGATIVE
Nitrite: NEGATIVE
Protein, ur: >500 mg/dL — AB
Specific Gravity, Urine: 1.026 (ref 1.005–1.030)
pH: 6 (ref 5.0–8.0)

## 2014-10-19 LAB — TROPONIN I: Troponin I: <0.03 ng/mL (ref <0.031)

## 2014-10-19 LAB — CBC
HCT: 30.9 % — ABNORMAL LOW (ref 35.0–47.0)
Hemoglobin: 10.4 g/dL — ABNORMAL LOW (ref 12.0–16.0)
MCH: 28.6 pg (ref 26.0–34.0)
MCHC: 33.5 g/dL (ref 32.0–36.0)
MCV: 85.4 fL (ref 80.0–100.0)
Platelets: 217 10*3/uL (ref 150–440)
RBC: 3.62 MIL/uL — ABNORMAL LOW (ref 3.80–5.20)
RDW: 15.2 % — ABNORMAL HIGH (ref 11.5–14.5)
WBC: 9.5 10*3/uL (ref 3.6–11.0)

## 2014-10-19 LAB — LIPASE, BLOOD: Lipase: 10 U/L — ABNORMAL LOW (ref 22–51)

## 2014-10-19 LAB — GLUCOSE, CAPILLARY: Glucose-Capillary: 160 mg/dL — ABNORMAL HIGH (ref 65–99)

## 2014-10-19 MED ORDER — ONDANSETRON 4 MG PO TBDP
4.0000 mg | ORAL_TABLET | Freq: Once | ORAL | Status: AC | PRN
  Administered 2014-10-19: 4 mg via ORAL
  Filled 2014-10-19: qty 1

## 2014-10-19 MED ORDER — LISINOPRIL 20 MG PO TABS
20.0000 mg | ORAL_TABLET | Freq: Once | ORAL | Status: AC
Start: 2014-10-19 — End: 2014-10-19
  Administered 2014-10-19: 20 mg via ORAL
  Filled 2014-10-19: qty 1

## 2014-10-19 MED ORDER — SODIUM CHLORIDE 0.9 % IV BOLUS (SEPSIS)
1000.0000 mL | Freq: Once | INTRAVENOUS | Status: AC
  Administered 2014-10-19: 1000 mL via INTRAVENOUS

## 2014-10-19 MED ORDER — ONDANSETRON HCL 4 MG/2ML IJ SOLN
4.0000 mg | Freq: Once | INTRAMUSCULAR | Status: AC
  Administered 2014-10-19: 4 mg via INTRAVENOUS

## 2014-10-19 MED ORDER — ONDANSETRON 4 MG PO TBDP: ORAL_TABLET | ORAL | Status: DC

## 2014-10-19 MED ORDER — ONDANSETRON HCL 4 MG/2ML IJ SOLN
INTRAMUSCULAR | Status: AC
  Filled 2014-10-19: qty 2

## 2014-10-19 NOTE — ED Notes (Signed)
Lab notified of add on troponin.

## 2014-10-19 NOTE — ED Notes (Signed)
Pt to ed with c/o abd pain and n/v since yesterday.  Pt reports she has not been able to keep fluids down.  Pt skin warm and dry.  Pt alert and oriented.  Pt states she is diabetic and has been unable to keep PO meds down.  Denies diarrhea.

## 2014-10-19 NOTE — ED Provider Notes (Signed)
CSN: [id number]     Arrival date & time 10/19/14  0654 History   First MD Initiated Contact with Patient 10/19/14 0740     Chief Complaint  Patient presents with  . Nausea  . Abdominal Pain     (Consider location/radiation/quality/duration/timing/severity/associated sxs/prior Treatment) The history is provided by the patient.  Jamie Arias is a 59 y.o. female hx of DM, HTN, HL, CABG, CHF with EF 35% who presenting with abdominal pain, nausea or vomiting. Symptoms since yesterday. Patient is able to keep down some applesauce but just has poor appetite. Vomited several times and had epigastric pain. Denies any fevers or chills or diarrhea. Denies any chest pain or shortness of breath and denies any sick contacts.   Past Medical History  Diagnosis Date  . Diabetes mellitus without complication   . Hypertension   . Hyperlipidemia   . Hypothyroidism   . Diabetic neuropathy   . Chronic systolic heart failure     a. Due to ischemic cardiomyopathy. EF as low as 35%, improved to normal s/p CABG; b. echo 07/06/13: EF 55-60%, no RWMAs, mod TR, trivial pericardial effusion not c/w tamponade physiology  . Anemia   . Coronary artery disease     a. NSTEMI 06/2013; b.cath: severe three-vessel CAD w/ EF 30% & mild-mod MR; c. s/p 3 vessel CABG 07/02/13 (LIMA-LAD, SVG-OM, and SVG-RPDA).   Past Surgical History  Procedure Laterality Date  . Eye surgery      Cataract both eyes  . Coronary artery bypass graft N/A 07/02/2013    Procedure: CORONARY ARTERY BYPASS GRAFTING (CABG);  Surgeon: Ivin [name], MD;  Location: [facility];  Service: Open Heart Surgery;  Laterality: N/A;  CABG x three, using left internal mammary artery and right leg greater saphenous vein harvested endoscopically  . Intraoperative transesophageal echocardiogram N/A 07/02/2013    Procedure: INTRAOPERATIVE TRANSESOPHAGEAL ECHOCARDIOGRAM;  Surgeon: Ivin [name], MD;  Location: [facility];  Service: Open Heart Surgery;  Laterality: N/A;    Family History  Problem Relation Age of Onset  . COPD Mother   . Cancer Mother     Lung  . Pulmonary embolism Father    Social History  Substance Use Topics  . Smoking status: Never Smoker   . Smokeless tobacco: Never Used  . Alcohol Use: No   OB History    Gravida Para Term Preterm AB TAB SAB Ectopic Multiple Living   5         4     Review of Systems  Gastrointestinal: Positive for nausea, vomiting and abdominal pain.  All other systems reviewed and are negative.     Allergies  Review of patient's allergies indicates no known allergies.  Home Medications   Prior to Admission medications   Medication Sig Start Date End Date Taking? Authorizing Provider  aspirin EC 325 MG EC tablet Take 1 tablet (325 mg total) by mouth daily. 07/14/13  Yes Donielle Liston Alba, PA-C  atorvastatin (LIPITOR) 80 MG tablet Take 80 mg by mouth daily.   Yes Historical Provider, MD  carvedilol (COREG) 6.25 MG tablet Take 1 tablet (6.25 mg total) by mouth 2 (two) times daily. 11/05/13  Yes Wellington Hampshire, MD  furosemide (LASIX) 20 MG tablet Take 1 tablet (20 mg total) by mouth daily. Patient taking differently: Take 10 mg by mouth daily.  04/06/14  Yes Wellington Hampshire, MD  gabapentin (NEURONTIN) 300 MG capsule TAKE ONE CAPSULE BY MOUTH THREE TIMES DAILY 06/07/14  Yes Morey Hummingbird  Cheryll Dessert, NP  Insulin Lispro Prot & Lispro (HUMALOG MIX 75/25 KWIKPEN) (75-25) 100 UNIT/ML Kwikpen Inject 0.08 mLs (8 Units total) into the skin 2 (two) times daily. 06/22/14  Yes Radhika P Phadke, MD  levothyroxine (SYNTHROID, LEVOTHROID) 50 MCG tablet TAKE ONE TABLET BY MOUTH ONCE DAILY 08/09/14  Yes Rubbie Battiest, NP  lisinopril (PRINIVIL,ZESTRIL) 20 MG tablet Take 1 tablet (20 mg total) by mouth daily. 06/16/14  Yes Rubbie Battiest, NP  metFORMIN (GLUCOPHAGE) 500 MG tablet TAKE ONE TABLET BY MOUTH TWICE DAILY WITH MEALS 06/30/14  Yes Rubbie Battiest, NP  nitroGLYCERIN (NITROSTAT) 0.4 MG SL tablet Place 1 tablet (0.4 mg total) under the  tongue every 5 (five) minutes as needed for chest pain. 07/30/13  Yes Wellington Hampshire, MD  traZODone (DESYREL) 50 MG tablet Take 1 tablet (50 mg total) by mouth at bedtime as needed for sleep. Patient taking differently: Take 25 mg by mouth at bedtime as needed for sleep.  05/31/14  Yes Rubbie Battiest, NP  blood glucose meter kit and supplies KIT Dispense based on patient and insurance preference. Use up to four times daily as directed. (FOR ICD-9 250.00, 250.01). 04/19/14   Rubbie Battiest, NP  glucose blood test strip Test 4 times daily Dx. E11.9. One Touch ultra strips 04/19/14   Rubbie Battiest, NP  Insulin Pen Needle 32G X 6 MM MISC Use with Humalog Kwikpen. 06/22/14   Haydee Monica, MD  Insulin Syringe-Needle U-100 (INSULIN SYRINGE .5CC/30GX1/2") 30G X 1/2" 0.5 ML MISC Use as directed 11/17/13   Raquel M Rey, NP   BP 160/78 mmHg  Pulse 73  Temp(Src) 97.8 F (36.6 C) (Oral)  Resp 16  Ht $R'5\' 4"'zV$  (1.626 m)  Wt 200 lb (90.719 kg)  BMI 34.31 kg/m2  SpO2 98% Physical Exam  Constitutional: She is oriented to person, place, and time.  Slightly dehydrated   HENT:  Head: Normocephalic.  MM slightly dry   Eyes: Conjunctivae are normal. Pupils are equal, round, and reactive to light.  Neck: Normal range of motion. Neck supple.  Cardiovascular: Normal rate, regular rhythm, normal heart sounds and intact distal pulses.   Pulmonary/Chest: Effort normal and breath sounds normal. No respiratory distress. She has no wheezes. She has no rales.  Abdominal: Soft. Bowel sounds are normal.  + epigastric tenderness, no rebound   Musculoskeletal: Normal range of motion. She exhibits no edema or tenderness.  Neurological: She is alert and oriented to person, place, and time. No cranial nerve deficit. Coordination normal.  Skin: Skin is warm and dry.  Psychiatric: She has a normal mood and affect. Her behavior is normal. Judgment and thought content normal.  Nursing note and vitals reviewed.   ED Course   Procedures (including critical care time) Labs Review Labs Reviewed  GLUCOSE, CAPILLARY - Abnormal; Notable for the following:    Glucose-Capillary 160 (*)    All other components within normal limits  LIPASE, BLOOD - Abnormal; Notable for the following:    Lipase 10 (*)    All other components within normal limits  COMPREHENSIVE METABOLIC PANEL - Abnormal; Notable for the following:    Glucose, Bld 173 (*)    BUN 23 (*)    Creatinine, Ser 1.57 (*)    Albumin 3.3 (*)    GFR calc non Af Amer 35 (*)    GFR calc Af Amer 40 (*)    All other components within normal limits  CBC - Abnormal; Notable  for the following:    RBC 3.62 (*)    Hemoglobin 10.4 (*)    HCT 30.9 (*)    RDW 15.2 (*)    All other components within normal limits  URINALYSIS COMPLETEWITH MICROSCOPIC ([facility] ONLY) - Abnormal; Notable for the following:    Color, Urine YELLOW (*)    APPearance CLEAR (*)    Glucose, UA >500 (*)    Ketones, ur TRACE (*)    Protein, ur >500 (*)    Squamous Epithelial / LPF 0-5 (*)    All other components within normal limits  TROPONIN I    Imaging Review No results found. I, YAO, DAVID, personally reviewed and evaluated these images and lab results as part of my medical decision-making.   EKG Interpretation None       ED ECG REPORT   Date: 10/19/2014  EKG Time: 1:27 PM  Rate: 64  Rhythm: normal sinus rhythm,  normal EKG, normal sinus rhythm, unchanged from previous tracings  Axis: 64  Intervals:none  ST&T Change: nonspecific  Narrative Interpretation:              MDM   Final diagnoses:  None   NEGIN HEGG is a 59 y.o. female here with nausea, epigastric pain. Likely gastroenteritis. No chest pain but has hx of CABG so will get trop x 1. Will get labs, hydrate and reassess.   1:27 PM Cr stable. Tolerated fluids. BP elevated on arrival but took home BP meds and given lisinopril and BP down to 160/78. No signs of hypertensive emergency. Ate sandwich. Likely  gastroenteritis. Will dc home.     Wandra Arthurs, MD 10/19/14 1329

## 2014-10-19 NOTE — Discharge Instructions (Signed)
Take zofran for nausea.   Stay hydrated.   Take tylenol for pain.   See your doctor.   Recheck blood pressure daily.   Return to ER if you have severe vomiting, abdominal pain, chest pain.

## 2014-10-19 NOTE — ED Notes (Signed)
Lab notified once again about add on troponin. Lab is in downtime and will call as soon as it is resulted to this RN or Dr Doree Fudge.

## 2014-10-25 ENCOUNTER — Other Ambulatory Visit: Payer: Self-pay | Admitting: Nurse Practitioner

## 2014-11-10 ENCOUNTER — Telehealth: Payer: Self-pay | Admitting: *Deleted

## 2014-11-10 NOTE — Telephone Encounter (Signed)
Need to see pt. Please schedule appointment

## 2014-11-10 NOTE — Telephone Encounter (Signed)
Patient has requested for a call back to be advised about her care for nausea. Patient stated to have continuous nausea. -Thanks

## 2014-11-10 NOTE — Telephone Encounter (Signed)
Pt was scheduled to be seen on 9.8.16

## 2014-11-11 ENCOUNTER — Telehealth: Payer: Self-pay | Admitting: *Deleted

## 2014-11-11 ENCOUNTER — Encounter: Payer: Self-pay | Admitting: Nurse Practitioner

## 2014-11-11 ENCOUNTER — Ambulatory Visit (INDEPENDENT_AMBULATORY_CARE_PROVIDER_SITE_OTHER): Payer: 59 | Admitting: Nurse Practitioner

## 2014-11-11 VITALS — BP 190/104 | HR 69 | Temp 98.4°F | Resp 16 | Ht 64.0 in | Wt 202.4 lb

## 2014-11-11 DIAGNOSIS — R10816 Epigastric abdominal tenderness: Secondary | ICD-10-CM

## 2014-11-11 DIAGNOSIS — I10 Essential (primary) hypertension: Secondary | ICD-10-CM | POA: Insufficient documentation

## 2014-11-11 DIAGNOSIS — R110 Nausea: Secondary | ICD-10-CM

## 2014-11-11 MED ORDER — ONDANSETRON 4 MG PO TBDP: ORAL_TABLET | ORAL | Status: DC

## 2014-11-11 NOTE — Assessment & Plan Note (Signed)
Epigastric tenderness and nausea that is not resolving 3 weeks. Patient was given refill of Zofran that was faxed to pharmacy. Patient was also given instructions to try Benadryl for allergy component of illness. This will also in turn help with the nausea. Will obtain abdominal ultrasound and follow-up after results.

## 2014-11-11 NOTE — Progress Notes (Signed)
[name], female    DOB: [birth year]  Age: 59 y.o. MRN: [id number]  CC: Nausea   HPI Jamie Arias presents for nausea since August. Patient is accompanied by husband today.  1) Nauseated, trying to drink fluids to keep hydrated Poor appetite- applesauce, half baked potato Went 10/19/14 to [facility]. Zofran helpful, but is currently out of prescription Dry heaving, reports no vomiting  Patient reports occasional epigastric pain, patient denies diarrhea  Patient is not taking certain medications due to nausea. She is eating enough to get by with taking her insulin.  Wt Readings from Last 3 Encounters:  11/11/14 202 lb 6.4 oz (91.808 kg)  10/19/14 200 lb (90.719 kg)  06/17/14 199 lb 4 oz (90.379 kg)   Blood sugar 221 this morning   Patient also reports having red circles around her eyes, itchy eyes, watery eyes, slight cough in the morning for 2 days.  History Jamie Arias has a past medical history of Diabetes mellitus without complication; Hypertension; Hyperlipidemia; Hypothyroidism; Diabetic neuropathy; Chronic systolic heart failure; Anemia; and Coronary artery disease.   She has past surgical history that includes Eye surgery; Coronary artery bypass graft (N/A, 07/02/2013); and Intraoprative transesophageal echocardiogram (N/A, 07/02/2013).   Her family history includes COPD in her mother; Cancer in her mother; Pulmonary embolism in her father.She reports that she has never smoked. She has never used smokeless tobacco. She reports that she does not drink alcohol or use illicit drugs.  Outpatient Prescriptions Prior to Visit  Medication Sig Dispense Refill  . aspirin EC 325 MG EC tablet Take 1 tablet (325 mg total) by mouth daily. 30 tablet 0  . atorvastatin (LIPITOR) 80 MG tablet Take 80 mg by mouth daily.    . blood glucose meter kit and supplies KIT Dispense based on patient and insurance preference. Use up to four times daily as directed. (FOR ICD-9 250.00,  250.01). 1 each 0  . carvedilol (COREG) 6.25 MG tablet Take 1 tablet (6.25 mg total) by mouth 2 (two) times daily. 180 tablet 3  . furosemide (LASIX) 20 MG tablet Take 1 tablet (20 mg total) by mouth daily. (Patient taking differently: Take 10 mg by mouth daily. ) 90 tablet 3  . gabapentin (NEURONTIN) 300 MG capsule TAKE ONE CAPSULE BY MOUTH THREE TIMES DAILY 90 capsule 6  . glucose blood test strip Test 4 times daily Dx. E11.9. One Touch ultra strips 100 each 12  . Insulin Lispro Prot & Lispro (HUMALOG MIX 75/25 KWIKPEN) (75-25) 100 UNIT/ML Kwikpen Inject 0.08 mLs (8 Units total) into the skin 2 (two) times daily. 15 mL 3  . Insulin Pen Needle 32G X 6 MM MISC Use with Humalog Kwikpen. 50 each 6  . Insulin Syringe-Needle U-100 (INSULIN SYRINGE .5CC/30GX1/2") 30G X 1/2" 0.5 ML MISC Use as directed 100 each 5  . levothyroxine (SYNTHROID, LEVOTHROID) 50 MCG tablet TAKE ONE TABLET BY MOUTH ONCE DAILY 30 tablet 11  . lisinopril (PRINIVIL,ZESTRIL) 20 MG tablet TAKE ONE TABLET BY MOUTH ONCE DAILY 30 tablet 0  . metFORMIN (GLUCOPHAGE) 500 MG tablet TAKE ONE TABLET BY MOUTH TWICE DAILY WITH MEALS 60 tablet 5  . nitroGLYCERIN (NITROSTAT) 0.4 MG SL tablet Place 1 tablet (0.4 mg total) under the tongue every 5 (five) minutes as needed for chest pain. 25 tablet 3  . traZODone (DESYREL) 50 MG tablet Take 1 tablet (50 mg total) by mouth at bedtime as needed for sleep. (Patient taking differently: Take 25 mg by mouth at  bedtime as needed for sleep. ) 30 tablet 4  . ondansetron (ZOFRAN ODT) 4 MG disintegrating tablet 4mg  ODT q4 hours prn nausea/vomit 8 tablet 0   No facility-administered medications prior to visit.    ROS Review of Systems  Constitutional: Positive for appetite change. Negative for fever, chills, diaphoresis and fatigue.  Eyes: Positive for discharge, redness and itching. Negative for pain and visual disturbance.  Respiratory: Negative for chest tightness, shortness of breath and wheezing.    Cardiovascular: Negative for chest pain, palpitations and leg swelling.  Gastrointestinal: Positive for nausea. Negative for vomiting, diarrhea and constipation.  Neurological: Negative for dizziness, weakness, numbness and headaches.  Psychiatric/Behavioral: The patient is not nervous/anxious.    Objective:  BP 190/104 mmHg  Pulse 69  Temp(Src) 98.4 F (36.9 C)  Resp 16  Ht 5\' 4"  (1.626 m)  Wt 202 lb 6.4 oz (91.808 kg)  BMI 34.72 kg/m2  SpO2 96%  Physical Exam  Constitutional: She is oriented to person, place, and time. She appears well-developed and well-nourished. No distress.  HENT:  Head: Normocephalic and atraumatic.  Right Ear: External ear normal.  Left Ear: External ear normal.  Eyes: EOM are normal. Pupils are equal, round, and reactive to light. Right eye exhibits no discharge. Left eye exhibits no discharge. No scleral icterus.  Cardiovascular: Normal rate, regular rhythm and normal heart sounds.   Pulmonary/Chest: Effort normal and breath sounds normal. No respiratory distress. She has no wheezes. She has no rales. She exhibits no tenderness.  Abdominal: Soft. Bowel sounds are normal. She exhibits no distension and no mass. There is tenderness. There is no rebound and no guarding.  Epigastric  Neurological: She is alert and oriented to person, place, and time. No cranial nerve deficit. She exhibits normal muscle tone. Coordination normal.  Skin: Skin is warm and dry. No rash noted. She is not diaphoretic.  Psychiatric: She has a normal mood and affect. Her behavior is normal. Judgment and thought content normal.   Assessment & Plan:   Brisa was seen today for nausea.  Diagnoses and all orders for this visit:  Nauseated -     US Abdomen Complete; Future  Epigastric abdominal tenderness -     US Abdomen Complete; Future  Essential hypertension  Other orders -     Discontinue: ondansetron (ZOFRAN ODT) 4 MG disintegrating tablet; 4mg  ODT q4 hours prn  nausea/vomit -     ondansetron (ZOFRAN ODT) 4 MG disintegrating tablet; 4mg  ODT q4 hours prn nausea/vomit  I am having Ms. Standen maintain her aspirin, nitroGLYCERIN, carvedilol, INSULIN SYRINGE .5CC/30GX1/2", furosemide, blood glucose meter kit and supplies, glucose blood, traZODone, gabapentin, Insulin Lispro Prot & Lispro, Insulin Pen Needle, metFORMIN, levothyroxine, atorvastatin, lisinopril, and ondansetron.  Meds ordered this encounter  Medications  . DISCONTD: ondansetron (ZOFRAN ODT) 4 MG disintegrating tablet    Sig: 4mg  ODT q4 hours prn nausea/vomit    Dispense:  8 tablet    Refill:  0    Order Specific Question:  Supervising Provider    Answer:  Deborra Medina L [[id number]]  . ondansetron (ZOFRAN ODT) 4 MG disintegrating tablet    Sig: 4mg  ODT q4 hours prn nausea/vomit    Dispense:  8 tablet    Refill:  3    Order Specific Question:  Supervising Provider    Answer:  Crecencio Mc [[id number]]     Follow-up: Return if symptoms worsen or fail to improve.

## 2014-11-11 NOTE — Telephone Encounter (Signed)
rx was faxed pt aware

## 2014-11-11 NOTE — Telephone Encounter (Signed)
Patient requesting medication for nausea -Thanks

## 2014-11-11 NOTE — Patient Instructions (Addendum)
Benadryl and Zofran. We will contact you about your Abdominal US.

## 2014-11-11 NOTE — Progress Notes (Signed)
Pre visit review using our clinic review tool, if applicable. No additional management support is needed unless otherwise documented below in the visit note. 

## 2014-11-11 NOTE — Assessment & Plan Note (Addendum)
Follow-up of hypertension. Advised patient to eat some food, take nausea medication, and take blood pressure medications.

## 2014-11-13 ENCOUNTER — Emergency Department
Admission: EM | Admit: 2014-11-13 | Discharge: 2014-11-13 | Disposition: A | Discharge status: Home / Self Care | Payer: 59 | Attending: Emergency Medicine | Admitting: Emergency Medicine

## 2014-11-13 ENCOUNTER — Encounter: Payer: Self-pay | Admitting: *Deleted

## 2014-11-13 DIAGNOSIS — Z7982 Long term (current) use of aspirin: Secondary | ICD-10-CM | POA: Insufficient documentation

## 2014-11-13 DIAGNOSIS — Z794 Long term (current) use of insulin: Secondary | ICD-10-CM | POA: Insufficient documentation

## 2014-11-13 DIAGNOSIS — Z79899 Other long term (current) drug therapy: Secondary | ICD-10-CM | POA: Diagnosis not present

## 2014-11-13 DIAGNOSIS — I10 Essential (primary) hypertension: Secondary | ICD-10-CM | POA: Insufficient documentation

## 2014-11-13 DIAGNOSIS — E1140 Type 2 diabetes mellitus with diabetic neuropathy, unspecified: Secondary | ICD-10-CM | POA: Diagnosis not present

## 2014-11-13 DIAGNOSIS — R110 Nausea: Secondary | ICD-10-CM | POA: Insufficient documentation

## 2014-11-13 LAB — URINALYSIS COMPLETE WITH MICROSCOPIC (ARMC ONLY)
Bilirubin Urine: NEGATIVE
Glucose, UA: 150 mg/dL — AB
Ketones, ur: NEGATIVE mg/dL
Leukocytes, UA: NEGATIVE
Nitrite: NEGATIVE
Protein, ur: >500 mg/dL — AB
Specific Gravity, Urine: 1.011 (ref 1.005–1.030)
pH: 8 (ref 5.0–8.0)

## 2014-11-13 LAB — COMPREHENSIVE METABOLIC PANEL
ALT: 13 U/L — ABNORMAL LOW (ref 14–54)
AST: 20 U/L (ref 15–41)
Albumin: 2.9 g/dL — ABNORMAL LOW (ref 3.5–5.0)
Alkaline Phosphatase: 65 U/L (ref 38–126)
Anion gap: 7 (ref 5–15)
BUN: 19 mg/dL (ref 6–20)
CO2: 27 mmol/L (ref 22–32)
Calcium: 8.9 mg/dL (ref 8.9–10.3)
Chloride: 106 mmol/L (ref 101–111)
Creatinine, Ser: 1.72 mg/dL — ABNORMAL HIGH (ref 0.44–1.00)
GFR calc Af Amer: 36 mL/min — ABNORMAL LOW (ref >60)
GFR calc non Af Amer: 31 mL/min — ABNORMAL LOW (ref >60)
Glucose, Bld: 169 mg/dL — ABNORMAL HIGH (ref 65–99)
Potassium: 4 mmol/L (ref 3.5–5.1)
Sodium: 140 mmol/L (ref 135–145)
Total Bilirubin: 0.7 mg/dL (ref 0.3–1.2)
Total Protein: 6.3 g/dL — ABNORMAL LOW (ref 6.5–8.1)

## 2014-11-13 LAB — CBC
HCT: 30.7 % — ABNORMAL LOW (ref 35.0–47.0)
Hemoglobin: 10.3 g/dL — ABNORMAL LOW (ref 12.0–16.0)
MCH: 28.7 pg (ref 26.0–34.0)
MCHC: 33.5 g/dL (ref 32.0–36.0)
MCV: 85.6 fL (ref 80.0–100.0)
Platelets: 226 10*3/uL (ref 150–440)
RBC: 3.58 MIL/uL — ABNORMAL LOW (ref 3.80–5.20)
RDW: 15.5 % — ABNORMAL HIGH (ref 11.5–14.5)
WBC: 7.4 10*3/uL (ref 3.6–11.0)

## 2014-11-13 LAB — LIPASE, BLOOD: Lipase: 11 U/L — ABNORMAL LOW (ref 22–51)

## 2014-11-13 MED ORDER — SODIUM CHLORIDE 0.9 % IV BOLUS (SEPSIS): 1000.0000 mL | Freq: Once | INTRAVENOUS | Status: DC

## 2014-11-13 MED ORDER — PROMETHAZINE HCL 25 MG PO TABS: 25.0000 mg | ORAL_TABLET | Freq: Three times a day (TID) | ORAL | Status: DC | PRN

## 2014-11-13 MED ORDER — SODIUM CHLORIDE 0.9 % IV BOLUS (SEPSIS)
500.0000 mL | Freq: Once | INTRAVENOUS | Status: AC
  Administered 2014-11-13: 500 mL via INTRAVENOUS

## 2014-11-13 MED ORDER — PROMETHAZINE HCL 25 MG/ML IJ SOLN
12.5000 mg | Freq: Once | INTRAMUSCULAR | Status: AC
  Administered 2014-11-13: 12.5 mg via INTRAVENOUS
  Filled 2014-11-13: qty 1

## 2014-11-13 NOTE — ED Provider Notes (Signed)
[facility] Emergency Department Provider Note REMINDER - THIS NOTE IS NOT A FINAL MEDICAL RECORD UNTIL IT IS SIGNED. UNTIL THEN, THE CONTENT BELOW MAY REFLECT INFORMATION FROM A DOCUMENTATION TEMPLATE, NOT THE ACTUAL PATIENT VISIT. ____________________________________________  Time seen: Approximately 8:09 AM  I have reviewed the triage vital signs and the nursing notes.   HISTORY  Chief Complaint Nausea    HPI Jamie Arias is a 59 y.o. female to history of diabetes and congestive heart failure. Patient reports that she's been feeling just generally nauseated off-and-on, worse during the evenings for approximately one month. She states that she has an ultrasound planned on Monday which her primary care doctor as ordered to check her gallbladder. She reports that last night she continued to have nausea, and this was relieved after sustaining Phenergan in the emergency room. She reports she is occasionally taking Zofran, and this does helpas well, but last night the nausea seemed severe.  She denies any pain. No abdominal pain. No vomiting. No fevers or chills. No trouble urinating. No chest pain or trouble breathing. She does have a notable history of diabetes, congestive heart failure and coronary disease with previous bypass.   Past Medical History  Diagnosis Date  . Diabetes mellitus without complication   . Hypertension   . Hyperlipidemia   . Hypothyroidism   . Diabetic neuropathy   . Chronic systolic heart failure     a. Due to ischemic cardiomyopathy. EF as low as 35%, improved to normal s/p CABG; b. echo 07/06/13: EF 55-60%, no RWMAs, mod TR, trivial pericardial effusion not c/w tamponade physiology  . Anemia   . Coronary artery disease     a. NSTEMI 06/2013; b.cath: severe three-vessel CAD w/ EF 30% & mild-mod MR; c. s/p 3 vessel CABG 07/02/13 (LIMA-LAD, SVG-OM, and SVG-RPDA).    Patient Active Problem List   Diagnosis Date Noted  . Epigastric  abdominal tenderness 11/11/2014  . Diabetes type 2, controlled 08/10/2013  . Low magnesium levels 08/10/2013  . Anemia 08/10/2013  . Constipation 08/10/2013  . Postoperative atrial fibrillation 07/30/2013  . Coronary artery disease   . Chronic systolic heart failure   . CAD (coronary artery disease) 07/02/2013  . Acute systolic heart failure 06/30/2013  . NSTEMI (non-ST elevated myocardial infarction) 06/29/2013  . Need for pneumococcal vaccination 10/24/2012  . Osteoporosis screening 10/24/2012  . Sleep disorder 05/23/2012  . Need for Tdap vaccination 04/27/2012  . Breast cancer screening 04/27/2012  . Colon cancer screening 04/27/2012  . Hypothyroid 04/27/2012  . HTN (hypertension) 04/27/2012  . HLD (hyperlipidemia) 04/27/2012  . Diabetes mellitus, type 2 04/27/2012  . Neuropathy 04/27/2012    Past Surgical History  Procedure Laterality Date  . Eye surgery      Cataract both eyes  . Coronary artery bypass graft N/A 07/02/2013    Procedure: CORONARY ARTERY BYPASS GRAFTING (CABG);  Surgeon: Kerin Perna, MD;  Location: [facility] OR;  Service: Open Heart Surgery;  Laterality: N/A;  CABG x three, using left internal mammary artery and right leg greater saphenous vein harvested endoscopically  . Intraoperative transesophageal echocardiogram N/A 07/02/2013    Procedure: INTRAOPERATIVE TRANSESOPHAGEAL ECHOCARDIOGRAM;  Surgeon: Kerin Perna, MD;  [facility];  Service: Open Heart Surgery;  Laterality: N/A;    Current Outpatient Rx  Name  Route  Sig  Dispense  Refill  . aspirin EC 325 MG EC tablet   Oral   Take 1 tablet (325 mg total) by mouth daily.  30 tablet   0   . atorvastatin (LIPITOR) 80 MG tablet   Oral   Take 80 mg by mouth daily.         . blood glucose meter kit and supplies KIT      Dispense based on patient and insurance preference. Use up to four times daily as directed. (FOR ICD-9 250.00, 250.01).   1 each   0     Insurance prefers onetouch ultra  meter.   . carvedilol (COREG) 6.25 MG tablet   Oral   Take 1 tablet (6.25 mg total) by mouth 2 (two) times daily.   180 tablet   3   . furosemide (LASIX) 20 MG tablet   Oral   Take 1 tablet (20 mg total) by mouth daily. Patient taking differently: Take 10 mg by mouth daily.    90 tablet   3   . gabapentin (NEURONTIN) 300 MG capsule      TAKE ONE CAPSULE BY MOUTH THREE TIMES DAILY   90 capsule   6   . glucose blood test strip      Test 4 times daily Dx. E11.9. One Touch ultra strips   100 each   12   . Insulin Lispro Prot & Lispro (HUMALOG MIX 75/25 KWIKPEN) (75-25) 100 UNIT/ML Kwikpen      Inject 0.08 mLs (8 Units total) into the skin 2 (two) times daily.   15 mL   3   . Insulin Pen Needle 32G X 6 MM MISC      Use with Humalog Kwikpen.   50 each   6   . Insulin Syringe-Needle U-100 (INSULIN SYRINGE .5CC/30GX1/2") 30G X 1/2" 0.5 ML MISC      Use as directed   100 each   5   . levothyroxine (SYNTHROID, LEVOTHROID) 50 MCG tablet      TAKE ONE TABLET BY MOUTH ONCE DAILY   30 tablet   11   . lisinopril (PRINIVIL,ZESTRIL) 20 MG tablet      TAKE ONE TABLET BY MOUTH ONCE DAILY   30 tablet   0   . metFORMIN (GLUCOPHAGE) 500 MG tablet      TAKE ONE TABLET BY MOUTH TWICE DAILY WITH MEALS   60 tablet   5   . nitroGLYCERIN (NITROSTAT) 0.4 MG SL tablet   Sublingual   Place 1 tablet (0.4 mg total) under the tongue every 5 (five) minutes as needed for chest pain.   25 tablet   3   . ondansetron (ZOFRAN ODT) 4 MG disintegrating tablet      '4mg'$  ODT q4 hours prn nausea/vomit   8 tablet   3   . promethazine (PHENERGAN) 25 MG tablet   Oral   Take 1 tablet (25 mg total) by mouth every 8 (eight) hours as needed for nausea or vomiting.   30 tablet   0   . traZODone (DESYREL) 50 MG tablet   Oral   Take 1 tablet (50 mg total) by mouth at bedtime as needed for sleep. Patient taking differently: Take 25 mg by mouth at bedtime as needed for sleep.    30  tablet   4     Allergies Review of patient's allergies indicates no known allergies.  Family History  Problem Relation Age of Onset  . COPD Mother   . Cancer Mother     Lung  . Pulmonary embolism Father     Social History Social History  Substance Use Topics  .  Smoking status: Never Smoker   . Smokeless tobacco: Never Used  . Alcohol Use: No    Review of Systems Constitutional: No fever/chills Eyes: No visual changes. ENT: No sore throat. Cardiovascular: Denies chest pain. Respiratory: Denies shortness of breath. Gastrointestinal: No abdominal pain.   no vomiting.  No diarrhea.  No constipation. Genitourinary: Negative for dysuria. Musculoskeletal: Negative for back pain. Skin: Negative for rash. Neurological: Negative for headaches, focal weakness or numbness.  10-point ROS otherwise negative.  ____________________________________________   PHYSICAL EXAM:  VITAL SIGNS: ED Triage Vitals  Enc Vitals Group     BP 11/13/14 0505 160/137 mmHg     Pulse Rate 11/13/14 0505 69     Resp 11/13/14 0505 18     Temp 11/13/14 0505 98.7 F (37.1 C)     Temp Source 11/13/14 0505 Oral     SpO2 11/13/14 0505 95 %     Weight --      Height --      Head Cir --      Peak Flow --      Pain Score --      Pain Loc --      Pain Edu? --      Excl. in [place]? --    Constitutional: Alert and oriented. Well appearing and in no acute distress. Eyes: Conjunctivae are normal. PERRL. EOMI. Head: Atraumatic. Nose: No congestion/rhinnorhea. Mouth/Throat: Mucous membranes are moist.  Oropharynx non-erythematous. Neck: No stridor.   Cardiovascular: Normal rate, regular rhythm. Grossly normal heart sounds.  Good peripheral circulation. Respiratory: Normal respiratory effort.  No retractions. Lungs CTAB. Gastrointestinal: Soft and nontender. No distention. No abdominal bruits. No CVA tenderness. No pain in the abdomen, negative for pain with Percell Miller. Musculoskeletal: No lower extremity  tenderness nor edema.  No joint effusions. Neurologic:  Normal speech and language. No gross focal neurologic deficits are appreciated. No gait instability. Skin:  Skin is warm, dry and intact. No rash noted. Psychiatric: Mood and affect are normal. Speech and behavior are normal.  ____________________________________________   LABS (all labs ordered are listed, but only abnormal results are displayed)  Labs Reviewed  LIPASE, BLOOD - Abnormal; Notable for the following:    Lipase 11 (*)    All other components within normal limits  COMPREHENSIVE METABOLIC PANEL - Abnormal; Notable for the following:    Glucose, Bld 169 (*)    Creatinine, Ser 1.72 (*)    Total Protein 6.3 (*)    Albumin 2.9 (*)    ALT 13 (*)    GFR calc non Af Amer 31 (*)    GFR calc Af Amer 36 (*)    All other components within normal limits  CBC - Abnormal; Notable for the following:    RBC 3.58 (*)    Hemoglobin 10.3 (*)    HCT 30.7 (*)    RDW 15.5 (*)    All other components within normal limits  URINALYSIS COMPLETEWITH MICROSCOPIC ([facility] ONLY) - Abnormal; Notable for the following:    Color, Urine YELLOW (*)    APPearance CLEAR (*)    Glucose, UA 150 (*)    Hgb urine dipstick 1+ (*)    Protein, ur >500 (*)    Bacteria, UA RARE (*)    Squamous Epithelial / LPF 0-5 (*)    All other components within normal limits   ____________________________________________  EKG  Reviewed and interpreted by me Normal sinus rhythm, there is some very minimal T-wave flattening without evidence of acute ischemic  change QTc 410 QRS 80 PR 170 Reviewed and interpreted as normal sinus rhythm with nonspecific T-wave abnormality, unlikely to be related to ischemic change. ____________________________________________  RADIOLOGY  Offered to perform ultrasound of the gallbladder, but patient states that her symptoms are better now and that she would prefer to have this done as an outpatient on Monday. She does not have  any focal tenderness of the abdomen, her LFTs do not show elevation, she is afebrile by do not believe that emergent imaging is required at this time. The patient will obtain this as an outpatient. ____________________________________________   PROCEDURES  Procedure(s) performed: None  Critical Care performed: No  ____________________________________________   INITIAL IMPRESSION / ASSESSMENT AND PLAN / ED COURSE  Pertinent labs & imaging results that were available during my care of the patient were reviewed by me and considered in my medical decision making (see chart for details).  Patient presents with ongoing nausea, shooting experience in this for over a week. She is been followed by her primary care doctor, and reports that Zofran does help with the nausea seems slightly worse last night. She currently reports in the ER that her nausea and symptoms are much much better after Phenergan. She denies any pain. She is afebrile stable vital signs. She denies any chest pain or cardiac symptoms.  I offered to do ultrasound for further evaluation ER, patient reports her symptoms are gone and that she will follow-up with ultrasound as planned on Monday. I think this is reasonable and I discuss careful return cautions. The patient does have some mild renal insufficiency, this was slightly worse and we've hydrated her with additional fluids here in the ER. Patient has close follow-up with her primary care doctor. ____________________________________________   FINAL CLINICAL IMPRESSION(S) / ED DIAGNOSES  Final diagnoses:  Nausea      Delman Kitten, MD 11/13/14 1001

## 2014-11-13 NOTE — ED Notes (Signed)
Discharge instructions, follow-up care, and prescription reviewed with patient. No questions or concerns at this time.

## 2014-11-13 NOTE — ED Notes (Signed)
Pt reports nausea x1 week - pt was seen here and given rx for zofran, pt also to have f/u US on Monday to evaluate gallbladder - pt states she has been taking her zofran and continues to experience nausea - last dose approx 0330. Pt also reports she has had decreased PO intake and unable to take her antihypertensives.

## 2014-11-13 NOTE — Discharge Instructions (Signed)
You have been seen in the Emergency Department (ED) today for nausea.  Your work up today has not shown a clear cause for your symptoms. You have been prescribed phenergran; please use as prescribed as needed for your nausea.  Follow up with your doctor as soon as possible regarding todays emergent visit and your symptoms of nausea.   Return to the ED if you develop abdominal, bloody vomiting, bloody diarrhea, if you are unable to tolerate fluids due to vomiting, or if you develop other symptoms that concern you.   Nausea, Adult Nausea is the feeling that you have an upset stomach or have to vomit. Nausea by itself is not likely a serious concern, but it may be an early sign of more serious medical problems. As nausea gets worse, it can lead to vomiting. If vomiting develops, there is the risk of dehydration.  CAUSES   Viral infections.  Food poisoning.  Medicines.  Pregnancy.  Motion sickness.  Migraine headaches.  Emotional distress.  Severe pain from any source.  Alcohol intoxication. HOME CARE INSTRUCTIONS  Get plenty of rest.  Ask your caregiver about specific rehydration instructions.  Eat small amounts of food and sip liquids more often.  Take all medicines as told by your caregiver. SEEK MEDICAL CARE IF:  You have not improved after 2 days, or you get worse.  You have a headache. SEEK IMMEDIATE MEDICAL CARE IF:   You have a fever.  You faint.  You keep vomiting or have blood in your vomit.  You are extremely weak or dehydrated.  You have dark or bloody stools.  You have severe chest or abdominal pain. MAKE SURE YOU:  Understand these instructions.  Will watch your condition.  Will get help right away if you are not doing well or get worse. Document Released: 03/29/2004 Document Revised: 11/14/2011 Document Reviewed: 11/01/2010 [facility] Patient Information 2015 [place], [place]. This information is not intended to replace advice given to you by  your health care provider. Make sure you discuss any questions you have with your health care provider.

## 2014-11-15 ENCOUNTER — Ambulatory Visit
Admission: RE | Admit: 2014-11-15 | Discharge: 2014-11-15 | Disposition: A | Discharge status: Home / Self Care | Payer: 59 | Source: Ambulatory Visit | Attending: Nurse Practitioner | Admitting: Nurse Practitioner

## 2014-11-15 DIAGNOSIS — R110 Nausea: Secondary | ICD-10-CM | POA: Diagnosis present

## 2014-11-15 DIAGNOSIS — R1013 Epigastric pain: Secondary | ICD-10-CM | POA: Diagnosis present

## 2014-11-15 DIAGNOSIS — R10816 Epigastric abdominal tenderness: Secondary | ICD-10-CM

## 2014-11-15 DIAGNOSIS — K8020 Calculus of gallbladder without cholecystitis without obstruction: Secondary | ICD-10-CM | POA: Diagnosis not present

## 2014-11-16 ENCOUNTER — Telehealth: Payer: Self-pay | Admitting: Nurse Practitioner

## 2014-11-16 ENCOUNTER — Encounter: Payer: Self-pay | Admitting: *Deleted

## 2014-11-16 ENCOUNTER — Other Ambulatory Visit: Payer: Self-pay | Admitting: Nurse Practitioner

## 2014-11-16 DIAGNOSIS — K8000 Calculus of gallbladder with acute cholecystitis without obstruction: Secondary | ICD-10-CM

## 2014-11-17 ENCOUNTER — Telehealth: Payer: Self-pay | Admitting: *Deleted

## 2014-11-17 NOTE — Telephone Encounter (Signed)
Rerouted message from [phone] to [facility]

## 2014-11-17 NOTE — Telephone Encounter (Signed)
Patient requested a call back on the progress on nausea medication. Thanks

## 2014-11-17 NOTE — Telephone Encounter (Signed)
Spoke with pts husband he states they will continue to try the two meds already prescribed.

## 2014-11-17 NOTE — Telephone Encounter (Signed)
She had 2 different meds for nausea. Zofran ordered by myself and Phenergan ordered by another physician 2 days later. If neither are helpful, there is not much we can do unless she goes to the ER and gets an IV. If she needs a refill of one of those then I will be happy to do so. Pain wise. I can send in tramadol, but I will wait until we determine what she wants to do about the nausea medication.

## 2014-11-18 ENCOUNTER — Ambulatory Visit: Payer: 59 | Admitting: Nurse Practitioner

## 2014-11-23 ENCOUNTER — Ambulatory Visit (INDEPENDENT_AMBULATORY_CARE_PROVIDER_SITE_OTHER): Payer: 59 | Admitting: Surgery

## 2014-11-23 ENCOUNTER — Encounter: Payer: Self-pay | Admitting: Surgery

## 2014-11-23 VITALS — BP 196/92 | HR 80 | Temp 98.6°F | Ht 64.0 in | Wt 191.0 lb

## 2014-11-23 DIAGNOSIS — R110 Nausea: Secondary | ICD-10-CM | POA: Diagnosis not present

## 2014-11-23 MED ORDER — SCOPOLAMINE 1 MG/3DAYS TD PT72: 1.0000 | MEDICATED_PATCH | TRANSDERMAL | Status: DC

## 2014-11-23 NOTE — Progress Notes (Signed)
General surgery history and physical  CC: Constant nausea with recurrent worsening  HPI: Jamie Arias is a pleasant 59 yo F with a history of CABG and ? EF who presents with intractable nausea.  May get much better but worse after certain fatty foods.  No emesis.  + loose BM intermittently.  No pain.  Has not been taking PO meds secondary to nausea.  Hypertensive at admission.  Otherwise no fevers/chills, night sweats, shortness of breath, cough, chest pain, shortness of breath, cough, dysuria/hematuria.  Active Ambulatory Problems    Diagnosis Date Noted  . Need for Tdap vaccination 04/27/2012  . Breast cancer screening 04/27/2012  . Colon cancer screening 04/27/2012  . Hypothyroid 04/27/2012  . HTN (hypertension) 04/27/2012  . HLD (hyperlipidemia) 04/27/2012  . Diabetes mellitus, type 2 04/27/2012  . Neuropathy 04/27/2012  . Sleep disorder 05/23/2012  . Need for pneumococcal vaccination 10/24/2012  . Osteoporosis screening 10/24/2012  . NSTEMI (non-ST elevated myocardial infarction) 06/29/2013  . Acute systolic heart failure 91/50/5697  . CAD (coronary artery disease) 07/02/2013  . Coronary artery disease   . Chronic systolic heart failure   . Postoperative atrial fibrillation 07/30/2013  . Diabetes type 2, controlled 08/10/2013  . Low magnesium levels 08/10/2013  . Anemia 08/10/2013  . Constipation 08/10/2013  . Epigastric abdominal tenderness 11/11/2014   Resolved Ambulatory Problems    Diagnosis Date Noted  . Near syncope 07/16/2012  . Dizziness 08/25/2012  . Hypertension 11/11/2014   Past Medical History  Diagnosis Date  . Diabetes mellitus without complication   . Hyperlipidemia   . Hypothyroidism   . Diabetic neuropathy    Past Surgical History  Procedure Laterality Date  . Eye surgery      Cataract both eyes  . Coronary artery bypass graft N/A 07/02/2013    Procedure: CORONARY ARTERY BYPASS GRAFTING (CABG);  Surgeon: Ivin [name], MD;  Location: [place];   Service: Open Heart Surgery;  Laterality: N/A;  CABG x three, using left internal mammary artery and right leg greater saphenous vein harvested endoscopically  . Intraoperative transesophageal echocardiogram N/A 07/02/2013    Procedure: INTRAOPERATIVE TRANSESOPHAGEAL ECHOCARDIOGRAM;  Surgeon: Ivin [name], MD;  Location: [place];  Service: Open Heart Surgery;  Laterality: N/A;     Medication List       This list is accurate as of: 11/23/14 12:03 PM.  Always use your most recent med list.               aspirin 325 MG EC tablet  Take 1 tablet (325 mg total) by mouth daily.     atorvastatin 80 MG tablet  Commonly known as:  LIPITOR  Take 80 mg by mouth daily.     blood glucose meter kit and supplies Kit  Dispense based on patient and insurance preference. Use up to four times daily as directed. (FOR ICD-9 250.00, 250.01).     carvedilol 6.25 MG tablet  Commonly known as:  COREG  Take 1 tablet (6.25 mg total) by mouth 2 (two) times daily.     furosemide 20 MG tablet  Commonly known as:  LASIX  Take 1 tablet (20 mg total) by mouth daily.     gabapentin 300 MG capsule  Commonly known as:  NEURONTIN  TAKE ONE CAPSULE BY MOUTH THREE TIMES DAILY     glucose blood test strip  Test 4 times daily Dx. E11.9. One Touch ultra strips     Insulin Lispro Prot & Lispro (75-25) 100 UNIT/ML  Kwikpen  Commonly known as:  HUMALOG MIX 75/25 KWIKPEN  Inject 0.08 mLs (8 Units total) into the skin 2 (two) times daily.     Insulin Pen Needle 32G X 6 MM Misc  Use with Humalog Kwikpen.     INSULIN SYRINGE .5CC/30GX1/2" 30G X 1/2" 0.5 ML Misc  Use as directed     levothyroxine 50 MCG tablet  Commonly known as:  SYNTHROID, LEVOTHROID  TAKE ONE TABLET BY MOUTH ONCE DAILY     lisinopril 20 MG tablet  Commonly known as:  PRINIVIL,ZESTRIL  TAKE ONE TABLET BY MOUTH ONCE DAILY     metFORMIN 500 MG tablet  Commonly known as:  GLUCOPHAGE  TAKE ONE TABLET BY MOUTH TWICE DAILY WITH MEALS      nitroGLYCERIN 0.4 MG SL tablet  Commonly known as:  NITROSTAT  Place 1 tablet (0.4 mg total) under the tongue every 5 (five) minutes as needed for chest pain.     ondansetron 4 MG disintegrating tablet  Commonly known as:  ZOFRAN ODT  $Re'4mg'DyD$  ODT q4 hours prn nausea/vomit     promethazine 25 MG tablet  Commonly known as:  PHENERGAN  Take 1 tablet (25 mg total) by mouth every 8 (eight) hours as needed for nausea or vomiting.     scopolamine 1 MG/3DAYS  Commonly known as:  TRANSDERM-SCOP (1.5 MG)  Place 1 patch (1.5 mg total) onto the skin every 3 (three) days.     traZODone 50 MG tablet  Commonly known as:  DESYREL  Take 1 tablet (50 mg total) by mouth at bedtime as needed for sleep.      No Known Allergies   Social History   Social History  . Marital Status: Married    Spouse Name: N/A  . Number of Children: N/A  . Years of Education: N/A   Occupational History  . Not on file.   Social History Main Topics  . Smoking status: Never Smoker   . Smokeless tobacco: Never Used  . Alcohol Use: No  . Drug Use: No  . Sexual Activity: Not on file   Other Topics Concern  . Not on file   Social History Narrative   Patient lives at home with her husband. Patient has 4 adult children.   Family History  Problem Relation Age of Onset  . COPD Mother   . Cancer Mother     Lung  . Pulmonary embolism Father     ROS: Full ROS obtained, pertinent positives and negatives as above  Blood pressure 196/92, pulse 80, temperature 98.6 F (37 C), temperature source Oral, height $RemoveBefo'5\' 4"'HQazBRNTldM$  (1.626 m), weight 191 lb (86.637 kg). GEN: NAD/A&Ox3 FACE: no obvious facial trauma, normal external nose, normal external ears EYES: no scleral icterus, no conjunctivitis HEAD: normocephalic atraumatic CV: RRR, no MRG RESP: moving air well, lungs clear ABD: soft, nontender, nondistended EXT: moving all ext well, strength 5/5 NEURO: cnII-XII grossly intact, sensation intact all 4 ext  Labs: All labs  personally reviewed, pertinent labs include: WBC 7.4, AST/ALT nl, bili 0.7  Imaging: All labs personally reviewed, significant for  Gallstones  A/P 59 yo with intermittent worsening nausea with fatty foods.  + gallstones.  Although it is unusual to not have pain, I have seen the occasional case where gallstones are associated with nausea and have been resolved with cholecystectomy.  I have told her, however, that there is a chance that this may not resolve her nausea issues.    Patient cardiac issues  make her risk elevated than the patient without a history of CAD and MI.  Have discussed cholecystectomy and its benefits and risks including a 1:200 risk of CBD injury.  She will be evaluated by her cardiologist

## 2014-11-23 NOTE — Patient Instructions (Addendum)
You are requesting to have your Gallbladder removed. We will have to get cardiac clearance from Dr. Fletcher Anon prior to scheduling this, since you have not seen him since February, you will most likely need to see him. We will take care of this and call you with the appointment.  We have sent your medication to your pharmacy for the nausea. Remember that this may dilate your eyes and cause extreme dry mouth. You may want to pick-up some sugar free lollipops to help with this.  Please see your surgical pre-care form ([employer]) for instructions regarding surgery.  Also refer to your information on BRAT diet to help decrease your nausea.

## 2014-11-24 ENCOUNTER — Ambulatory Visit: Payer: Self-pay | Admitting: General Surgery

## 2014-11-25 ENCOUNTER — Telehealth: Payer: Self-pay

## 2014-11-25 NOTE — Telephone Encounter (Signed)
Call made to Dr. Tyrell Antonio office at this time to see if patient can be seen for cardiac clearance.   Appointment was made for 11/30/14 at 1445 with Dr. Fletcher Anon for clearance.  Clearance Letter faxed down at this time.  Called patient's husband, [name] and he was given all of the above information. Verbalizes understanding and will be at appointment.

## 2014-11-30 ENCOUNTER — Encounter: Payer: Self-pay | Admitting: Cardiovascular Disease

## 2014-11-30 ENCOUNTER — Ambulatory Visit (INDEPENDENT_AMBULATORY_CARE_PROVIDER_SITE_OTHER): Payer: 59 | Admitting: Cardiovascular Disease

## 2014-11-30 ENCOUNTER — Telehealth: Payer: Self-pay | Admitting: Nurse Practitioner

## 2014-11-30 VITALS — BP 140/70 | HR 67 | Ht 64.0 in | Wt 189.5 lb

## 2014-11-30 DIAGNOSIS — I5022 Chronic systolic (congestive) heart failure: Secondary | ICD-10-CM

## 2014-11-30 DIAGNOSIS — I10 Essential (primary) hypertension: Secondary | ICD-10-CM

## 2014-11-30 DIAGNOSIS — R0989 Other specified symptoms and signs involving the circulatory and respiratory systems: Secondary | ICD-10-CM | POA: Diagnosis not present

## 2014-11-30 DIAGNOSIS — I25709 Atherosclerosis of coronary artery bypass graft(s), unspecified, with unspecified angina pectoris: Secondary | ICD-10-CM

## 2014-11-30 DIAGNOSIS — Z01810 Encounter for preprocedural cardiovascular examination: Secondary | ICD-10-CM | POA: Diagnosis not present

## 2014-11-30 MED ORDER — ASPIRIN EC 81 MG PO TBEC: 81.0000 mg | DELAYED_RELEASE_TABLET | Freq: Every day | ORAL | Status: DC

## 2014-11-30 NOTE — Telephone Encounter (Signed)
The patient's husband called to see if his wife's Insurance form [facility]) was completed . He dropped it off last Thursday . The form is needed in order for his wife to be paid for being out of work.

## 2014-11-30 NOTE — Assessment & Plan Note (Signed)
Her myocardial infarction was more than a year ago and she had complete revascularization with CABG. Her LV systolic function went back to normal in February of this year. She currently has no symptoms suggestive of angina or heart failure. Thus, she is considered at an overall low risk for surgery from a cardiac standpoint. I decreased the dose of aspirin to 81 mg once daily. I advised her to continue taking her blood pressure medications.

## 2014-11-30 NOTE — Assessment & Plan Note (Signed)
She has no symptoms of angina. Continue medical therapy. I decreased aspirin to 81 mg once daily.

## 2014-11-30 NOTE — Telephone Encounter (Signed)
Pt was called to inform that Jamie Arias will have it completed in the five days she was given.

## 2014-11-30 NOTE — Progress Notes (Signed)
HPI  This is a 59 year old female who is here today for followup regarding coronary artery disease status post CABG. She has known history of diabetes, chronic kidney disease, anemia, hypertension and hyperlipidemia. She presented in April of 2015 with non-ST elevation myocardial infarction with late presentation. Ejection fraction was 35% with multiple wall motion abnormalities and moderate mitral regurgitation. She underwent cardiac catheterization which showed severe three-vessel coronary artery disease. She underwent CABG at [facility]. She had postoperative hypotension and atrial fibrillation. She was discharged home but presented back to [facility] in May with worsening dyspnea, leg edema and weight gain. Chest x-ray showed worsening pleural effusion on the left side. She ruled out for myocardial infarction. She underwent left-sided thoracentesis with removal of 600 cc of serosanguineous fluid.   Most recent echocardiogram in February of this year showed an ejection fraction of 55-60% with grade 2 diastolic dysfunction, mild aortic regurgitation and minimal pulmonary hypertension.  She has been doing reasonably well from a cardiac standpoint with no recurrent chest pain or worsening dyspnea. She had 2 emergency room visits in the last few months for nausea. No abdominal pain. She was diagnosed with gallstones and cholecystectomy has been recommended.  No Known Allergies   Current Outpatient Prescriptions on File Prior to Visit  Medication Sig Dispense Refill  . aspirin EC 325 MG EC tablet Take 1 tablet (325 mg total) by mouth daily. 30 tablet 0  . atorvastatin (LIPITOR) 80 MG tablet Take 80 mg by mouth daily.    . blood glucose meter kit and supplies KIT Dispense based on patient and insurance preference. Use up to four times daily as directed. (FOR ICD-9 250.00, 250.01). 1 each 0  . carvedilol (COREG) 6.25 MG tablet Take 1 tablet (6.25 mg total) by mouth 2 (two) times daily. 180 tablet 3  . furosemide  (LASIX) 20 MG tablet Take 1 tablet (20 mg total) by mouth daily. (Patient taking differently: Take 10 mg by mouth daily. ) 90 tablet 3  . gabapentin (NEURONTIN) 300 MG capsule TAKE ONE CAPSULE BY MOUTH THREE TIMES DAILY 90 capsule 6  . glucose blood test strip Test 4 times daily Dx. E11.9. One Touch ultra strips 100 each 12  . Insulin Lispro Prot & Lispro (HUMALOG MIX 75/25 KWIKPEN) (75-25) 100 UNIT/ML Kwikpen Inject 0.08 mLs (8 Units total) into the skin 2 (two) times daily. 15 mL 3  . Insulin Pen Needle 32G X 6 MM MISC Use with Humalog Kwikpen. 50 each 6  . Insulin Syringe-Needle U-100 (INSULIN SYRINGE .5CC/30GX1/2") 30G X 1/2" 0.5 ML MISC Use as directed 100 each 5  . levothyroxine (SYNTHROID, LEVOTHROID) 50 MCG tablet TAKE ONE TABLET BY MOUTH ONCE DAILY 30 tablet 11  . lisinopril (PRINIVIL,ZESTRIL) 20 MG tablet TAKE ONE TABLET BY MOUTH ONCE DAILY 30 tablet 0  . metFORMIN (GLUCOPHAGE) 500 MG tablet TAKE ONE TABLET BY MOUTH TWICE DAILY WITH MEALS 60 tablet 5  . nitroGLYCERIN (NITROSTAT) 0.4 MG SL tablet Place 1 tablet (0.4 mg total) under the tongue every 5 (five) minutes as needed for chest pain. 25 tablet 3  . ondansetron (ZOFRAN ODT) 4 MG disintegrating tablet 20m ODT q4 hours prn nausea/vomit 8 tablet 3  . promethazine (PHENERGAN) 25 MG tablet Take 1 tablet (25 mg total) by mouth every 8 (eight) hours as needed for nausea or vomiting. 30 tablet 0  . scopolamine (TRANSDERM-SCOP, 1.5 MG,) 1 MG/3DAYS Place 1 patch (1.5 mg total) onto the skin every 3 (three) days. 10 patch 0  .  traZODone (DESYREL) 50 MG tablet Take 1 tablet (50 mg total) by mouth at bedtime as needed for sleep. (Patient taking differently: Take 25 mg by mouth at bedtime as needed for sleep. ) 30 tablet 4   No current facility-administered medications on file prior to visit.     Past Medical History  Diagnosis Date  . Diabetes mellitus without complication   . Hypertension   . Hyperlipidemia   . Hypothyroidism   . Diabetic  neuropathy   . Chronic systolic heart failure     a. Due to ischemic cardiomyopathy. EF as low as 35%, improved to normal s/p CABG; b. echo 07/06/13: EF 55-60%, no RWMAs, mod TR, trivial pericardial effusion not c/w tamponade physiology  . Anemia   . Coronary artery disease     a. NSTEMI 06/2013; b.cath: severe three-vessel CAD w/ EF 30% & mild-mod MR; c. s/p 3 vessel CABG 07/02/13 (LIMA-LAD, SVG-OM, and SVG-RPDA).     Past Surgical History  Procedure Laterality Date  . Eye surgery      Cataract both eyes  . Coronary artery bypass graft N/A 07/02/2013    Procedure: CORONARY ARTERY BYPASS GRAFTING (CABG);  Surgeon: Ivin [name], MD;  Location: [place];  Service: Open Heart Surgery;  Laterality: N/A;  CABG x three, using left internal mammary artery and right leg greater saphenous vein harvested endoscopically  . Intraoperative transesophageal echocardiogram N/A 07/02/2013    Procedure: INTRAOPERATIVE TRANSESOPHAGEAL ECHOCARDIOGRAM;  Surgeon: Ivin [name], MD;  Location: [place];  Service: Open Heart Surgery;  Laterality: N/A;     Family History  Problem Relation Age of Onset  . COPD Mother   . Cancer Mother     Lung  . Pulmonary embolism Father      Social History   Social History  . Marital Status: Married    Spouse Name: N/A  . Number of Children: N/A  . Years of Education: N/A   Occupational History  . Not on file.   Social History Main Topics  . Smoking status: Never Smoker   . Smokeless tobacco: Never Used  . Alcohol Use: No  . Drug Use: No  . Sexual Activity: Not on file   Other Topics Concern  . Not on file   Social History Narrative   Patient lives at home with her husband. Patient has 4 adult children.     PHYSICAL EXAM   BP 140/70 mmHg  Pulse 67  Ht $R'5\' 4"'UU$  (1.626 m)  Wt 189 lb 8 oz (85.957 kg)  BMI 32.51 kg/m2 Constitutional: She is oriented to person, place, and time. She appears well-developed and well-nourished. No distress.  HENT: No nasal  discharge.  Head: Normocephalic and atraumatic.  Eyes: Pupils are equal and round. No discharge.  Neck: Normal range of motion. Neck supple. No JVD present. No thyromegaly present. Faint bilateral carotid bruits Cardiovascular: Normal rate, regular rhythm, normal heart sounds. Exam reveals no gallop and no friction rub. No murmur heard.  Pulmonary/Chest: Effort normal and diminished breath sounds at the left base. No stridor. No respiratory distress. She has no wheezes. She has no rales. She exhibits no tenderness.  Abdominal: Soft. Bowel sounds are normal. She exhibits no distension. There is no tenderness. There is no rebound and no guarding.  Musculoskeletal: Normal range of motion. She exhibits no edema and no tenderness.  Neurological: She is alert and oriented to person, place, and time. Coordination normal.  Skin: Skin is warm and dry. No rash noted. She  is not diaphoretic. No erythema. No pallor.  Psychiatric: She has a normal mood and affect. Her behavior is normal. Judgment and thought content normal.   EKG: Normal sinus rhythm with inferior T wave changes.  ASSESSMENT AND PLAN

## 2014-11-30 NOTE — Assessment & Plan Note (Signed)
Blood pressure was elevated recently when she was not able to take her medications. She has resumed taking her medications and blood pressure seems to be much better.

## 2014-11-30 NOTE — Patient Instructions (Addendum)
Medication Instructions:  Your physician has recommended you make the following change in your medication:  DECREASE aspirin to 81mg  once per day   Labwork: none  Testing/Procedures: Your physician has requested that you have a carotid duplex. This test is an ultrasound of the carotid arteries in your neck. It looks at blood flow through these arteries that supply the brain with blood. Allow one hour for this exam. There are no restrictions or special instructions.    Follow-Up: Your physician wants you to follow-up in: six months with Dr. Fletcher Anon.  You will receive a reminder letter in the mail two months in advance. If you don't receive a letter, please call our office to schedule the follow-up appointment.   Any Other Special Instructions Will Be Listed Below (If Applicable).  You are at low risk from a cardiac standpoint for your upcoming surgery

## 2014-11-30 NOTE — Assessment & Plan Note (Signed)
I requested carotid Doppler.

## 2014-11-30 NOTE — Assessment & Plan Note (Signed)
She appears to be euvolemic on small dose furosemide. Most recent ejection fraction was normal in February.

## 2014-12-02 ENCOUNTER — Telehealth: Payer: Self-pay

## 2014-12-02 NOTE — Telephone Encounter (Signed)
Pt advised of pre op date/time and sx date. Sx: 12/09/14 with Dr Maren Beach Chole Pre op: 12/07/14 between 1-5--Phone.

## 2014-12-02 NOTE — Telephone Encounter (Signed)
We have received cardiac clearance from Dr. Fletcher Anon in a note on 11/23/14. PLease go ahead with surgery.  Patient was called an informed patient's husband, per her request that clearance has been obtained and that Angie would be calling with the details of surgery and pre-op.  Please schedule Laparoscopic Cholecystectomy for this patient with Dr. Rexene Edison- week of 10/3. She can have phone pre-op and labs have been completed.   I have explained to patient that she needs to hold Aspirin 5 days prior and resume immediately afterwards.  I will ask Dr. Rexene Edison to place surgical orders.  Please call her with Pre-op and surgery date please.

## 2014-12-06 NOTE — Addendum Note (Signed)
Addended by: Marlyce [name] A on: 12/06/2014 07:05 AM   Modules accepted: Orders

## 2014-12-06 NOTE — H&P (Signed)
General surgery history and physical   CC: Constant nausea with recurrent worsening  HPI: Jamie Arias is a pleasant 60 yo F with a history of CABG and ? EF who presents with intractable nausea.  May get much better but worse after certain fatty foods.  No emesis.  + loose BM intermittently.  No pain.  Has not been taking PO meds secondary to nausea.  Hypertensive at admission.  Otherwise no fevers/chills, night sweats, shortness of breath, cough, chest pain, shortness of breath, cough, dysuria/hematuria.    Active Ambulatory Problems      Diagnosis  Date Noted   .  Need for Tdap vaccination  04/27/2012   .  Breast cancer screening  04/27/2012   .  Colon cancer screening  04/27/2012   .  Hypothyroid  04/27/2012   .  HTN (hypertension)  04/27/2012   .  HLD (hyperlipidemia)  04/27/2012   .  Diabetes mellitus, type 2  04/27/2012   .  Neuropathy  04/27/2012   .  Sleep disorder  05/23/2012   .  Need for pneumococcal vaccination  10/24/2012   .  Osteoporosis screening  10/24/2012   .  NSTEMI (non-ST elevated myocardial infarction)  06/29/2013   .  Acute systolic heart failure  68/34/1962   .  CAD (coronary artery disease)  07/02/2013   .  Coronary artery disease     .  Chronic systolic heart failure     .  Postoperative atrial fibrillation  07/30/2013   .  Diabetes type 2, controlled  08/10/2013   .  Low magnesium levels  08/10/2013   .  Anemia  08/10/2013   .  Constipation  08/10/2013   .  Epigastric abdominal tenderness  11/11/2014       Resolved Ambulatory Problems      Diagnosis  Date Noted   .  Near syncope  07/16/2012   .  Dizziness  08/25/2012   .  Hypertension  11/11/2014       Past Medical History   Diagnosis  Date   .  Diabetes mellitus without complication     .  Hyperlipidemia     .  Hypothyroidism     .  Diabetic neuropathy      Past Surgical History   Procedure  Laterality  Date   .  Eye surgery           Cataract both eyes   .  Coronary artery bypass graft   N/A  07/02/2013       Procedure: CORONARY ARTERY BYPASS GRAFTING (CABG);  Surgeon: Ivin [name], MD;  Location: [place];  Service: Open Heart Surgery;  Laterality: N/A;  CABG x three, using left internal mammary artery and right leg greater saphenous vein harvested endoscopically   .  Intraoperative transesophageal echocardiogram  N/A  07/02/2013       Procedure: INTRAOPERATIVE TRANSESOPHAGEAL ECHOCARDIOGRAM;  Surgeon: Ivin [name], MD;  Location: [place];  Service: Open Heart Surgery;  Laterality: N/A;       Medication List           This list is accurate as of: 11/23/14 12:03 PM.  Always use your most recent med list.                         aspirin 325 MG EC tablet   Take 1 tablet (325 mg total) by mouth daily.         atorvastatin  80 MG tablet   Commonly known as:  LIPITOR   Take 80 mg by mouth daily.         blood glucose meter kit and supplies Kit   Dispense based on patient and insurance preference. Use up to four times daily as directed. (FOR ICD-9 250.00, 250.01).         carvedilol 6.25 MG tablet   Commonly known as:  COREG   Take 1 tablet (6.25 mg total) by mouth 2 (two) times daily.         furosemide 20 MG tablet   Commonly known as:  LASIX   Take 1 tablet (20 mg total) by mouth daily.         gabapentin 300 MG capsule   Commonly known as:  NEURONTIN   TAKE ONE CAPSULE BY MOUTH THREE TIMES DAILY         glucose blood test strip   Test 4 times daily Dx. E11.9. One Touch ultra strips         Insulin Lispro Prot & Lispro (75-25) 100 UNIT/ML Kwikpen   Commonly known as:  HUMALOG MIX 75/25 KWIKPEN   Inject 0.08 mLs (8 Units total) into the skin 2 (two) times daily.         Insulin Pen Needle 32G X 6 MM Misc   Use with Humalog Kwikpen.         INSULIN SYRINGE .5CC/30GX1/2" 30G X 1/2" 0.5 ML Misc   Use as directed         levothyroxine 50 MCG tablet   Commonly known as:  SYNTHROID, LEVOTHROID   TAKE ONE TABLET BY MOUTH ONCE DAILY          lisinopril 20 MG tablet   Commonly known as:  PRINIVIL,ZESTRIL   TAKE ONE TABLET BY MOUTH ONCE DAILY         metFORMIN 500 MG tablet   Commonly known as:  GLUCOPHAGE   TAKE ONE TABLET BY MOUTH TWICE DAILY WITH MEALS         nitroGLYCERIN 0.4 MG SL tablet   Commonly known as:  NITROSTAT   Place 1 tablet (0.4 mg total) under the tongue every 5 (five) minutes as needed for chest pain.         ondansetron 4 MG disintegrating tablet   Commonly known as:  ZOFRAN ODT   '4mg'$  ODT q4 hours prn nausea/vomit         promethazine 25 MG tablet   Commonly known as:  PHENERGAN   Take 1 tablet (25 mg total) by mouth every 8 (eight) hours as needed for nausea or vomiting.         scopolamine 1 MG/3DAYS   Commonly known as:  TRANSDERM-SCOP (1.5 MG)   Place 1 patch (1.5 mg total) onto the skin every 3 (three) days.         traZODone 50 MG tablet   Commonly known as:  DESYREL   Take 1 tablet (50 mg total) by mouth at bedtime as needed for sleep.           No Known Allergies     Social History       Social History   .  Marital Status:  Married       Spouse Name:  N/A   .  Number of Children:  N/A   .  Years of Education:  N/A       Occupational History   .  Not on file.  Social History Main Topics   .  Smoking status:  Never Smoker    .  Smokeless tobacco:  Never Used   .  Alcohol Use:  No   .  Drug Use:  No   .  Sexual Activity:  Not on file       Other Topics  Concern   .  Not on file       Social History Narrative     Patient lives at home with her husband. Patient has 4 adult children.    Family History   Problem  Relation  Age of Onset   .  COPD  Mother     .  Cancer  Mother         Lung   .  Pulmonary embolism  Father        ROS: Full ROS obtained, pertinent positives and negatives as above   Blood pressure 196/92, pulse 80, temperature 98.6 F (37 C), temperature source Oral, height $RemoveBefo'5\' 4"'FbujTtkDxhq$  (1.626 m), weight 191 lb (86.637 kg). GEN: NAD/A&Ox3 FACE:  no obvious facial trauma, normal external nose, normal external ears EYES: no scleral icterus, no conjunctivitis HEAD: normocephalic atraumatic CV: RRR, no MRG RESP: moving air well, lungs clear ABD: soft, nontender, nondistended EXT: moving all ext well, strength 5/5 NEURO: cnII-XII grossly intact, sensation intact all 4 ext   Labs: All labs personally reviewed, pertinent labs include: WBC 7.4, AST/ALT nl, bili 0.7   Imaging: All labs personally reviewed, significant for   Gallstones   A/P 59 yo with intermittent worsening nausea with fatty foods.  + gallstones.  Although it is unusual to not have pain, I have seen the occasional case where gallstones are associated with nausea and have been resolved with cholecystectomy.  I have told her, however, that there is a chance that this may not resolve her nausea issues.    Patient cardiac issues make her risk elevated than the patient without a history of CAD and MI.  Have discussed cholecystectomy and its benefits and risks including a 1:200 risk of CBD injury.  She will be evaluated by her cardiologist

## 2014-12-07 ENCOUNTER — Other Ambulatory Visit: Payer: 59

## 2014-12-07 ENCOUNTER — Encounter: Payer: Self-pay | Admitting: *Deleted

## 2014-12-07 NOTE — Patient Instructions (Signed)
  Your procedure is scheduled on: 12-09-14 Report to [facility]. To find out your arrival time please call [phone] between 1PM - 3PM on 12-08-14  Remember: Instructions that are not followed completely may result in serious medical risk, up to and including death, or upon the discretion of your surgeon and anesthesiologist your surgery may need to be rescheduled.    __X__ 1. Do not eat food or drink liquids after midnight. No gum chewing or hard candies.     __X__ 2. No Alcohol for 24 hours before or after surgery.   ____ 3. Bring all medications with you on the day of surgery if instructed.    ____ 4. Notify your doctor if there is any change in your medical condition     (cold, fever, infections).     Do not wear jewelry, make-up, hairpins, clips or nail polish.  Do not wear lotions, powders, or perfumes. You may wear deodorant.  Do not shave 48 hours prior to surgery. Men may shave face and neck.  Do not bring valuables to the hospital.    [facility] is not responsible for any belongings or valuables.               Contacts, dentures or bridgework may not be worn into surgery.  Leave your suitcase in the car. After surgery it may be brought to your room.  For patients admitted to the hospital, discharge time is determined by your   treatment team.   Patients discharged the day of surgery will not be allowed to drive home.   Please read over the following fact sheets that you were given:      _X___ Take these medicines the morning of surgery with A SIP OF WATER:     1. LIPITOR  2. COREG  3. LEVOTHYROXINE  4. GABAPENTIN  5.  LISINOPRIL  6.  ____ Fleet Enema (as directed)   ____ Use CHG Soap as directed  ____ Use inhalers on the day of surgery  _X___ Stop metformin 2 days prior to surgery-STOP METFORMIN NOW    ____ Take 1/2 of usual insulin dose the night before surgery and none on the morning of surgery.   ____ Stop  Coumadin/Plavix/aspirin-PT STOPPED ASA LAST WEEK  ____ Stop Anti-inflammatories-NO NSAIDS OR ASA PRODUCTS-TYLENOL OK   ____ Stop supplements until after surgery.    ____ Bring C-Pap to the hospital.

## 2014-12-08 ENCOUNTER — Encounter: Payer: Self-pay | Admitting: *Deleted

## 2014-12-09 ENCOUNTER — Ambulatory Visit
Admission: RE | Admit: 2014-12-09 | Discharge: 2014-12-09 | Disposition: A | Discharge status: Home / Self Care | Payer: 59 | Source: Ambulatory Visit | Attending: Surgery | Admitting: Surgery

## 2014-12-09 ENCOUNTER — Ambulatory Visit: Payer: 59 | Admitting: Anesthesiology

## 2014-12-09 ENCOUNTER — Encounter
Admission: RE | Disposition: A | Discharge status: Home / Self Care | Payer: Self-pay | Source: Ambulatory Visit | Attending: Surgery

## 2014-12-09 ENCOUNTER — Encounter: Payer: Self-pay | Admitting: *Deleted

## 2014-12-09 DIAGNOSIS — E039 Hypothyroidism, unspecified: Secondary | ICD-10-CM | POA: Diagnosis not present

## 2014-12-09 DIAGNOSIS — Z9841 Cataract extraction status, right eye: Secondary | ICD-10-CM | POA: Diagnosis not present

## 2014-12-09 DIAGNOSIS — Z801 Family history of malignant neoplasm of trachea, bronchus and lung: Secondary | ICD-10-CM | POA: Diagnosis not present

## 2014-12-09 DIAGNOSIS — Z8489 Family history of other specified conditions: Secondary | ICD-10-CM | POA: Insufficient documentation

## 2014-12-09 DIAGNOSIS — Z7984 Long term (current) use of oral hypoglycemic drugs: Secondary | ICD-10-CM | POA: Insufficient documentation

## 2014-12-09 DIAGNOSIS — E1122 Type 2 diabetes mellitus with diabetic chronic kidney disease: Secondary | ICD-10-CM | POA: Diagnosis not present

## 2014-12-09 DIAGNOSIS — K8010 Calculus of gallbladder with chronic cholecystitis without obstruction: Secondary | ICD-10-CM | POA: Diagnosis present

## 2014-12-09 DIAGNOSIS — E1140 Type 2 diabetes mellitus with diabetic neuropathy, unspecified: Secondary | ICD-10-CM | POA: Insufficient documentation

## 2014-12-09 DIAGNOSIS — I5022 Chronic systolic (congestive) heart failure: Secondary | ICD-10-CM | POA: Insufficient documentation

## 2014-12-09 DIAGNOSIS — I130 Hypertensive heart and chronic kidney disease with heart failure and stage 1 through stage 4 chronic kidney disease, or unspecified chronic kidney disease: Secondary | ICD-10-CM | POA: Diagnosis not present

## 2014-12-09 DIAGNOSIS — Z951 Presence of aortocoronary bypass graft: Secondary | ICD-10-CM | POA: Insufficient documentation

## 2014-12-09 DIAGNOSIS — Z9842 Cataract extraction status, left eye: Secondary | ICD-10-CM | POA: Diagnosis not present

## 2014-12-09 DIAGNOSIS — Z794 Long term (current) use of insulin: Secondary | ICD-10-CM | POA: Diagnosis not present

## 2014-12-09 DIAGNOSIS — E785 Hyperlipidemia, unspecified: Secondary | ICD-10-CM | POA: Insufficient documentation

## 2014-12-09 DIAGNOSIS — I2510 Atherosclerotic heart disease of native coronary artery without angina pectoris: Secondary | ICD-10-CM | POA: Diagnosis not present

## 2014-12-09 DIAGNOSIS — Z825 Family history of asthma and other chronic lower respiratory diseases: Secondary | ICD-10-CM | POA: Diagnosis not present

## 2014-12-09 DIAGNOSIS — Z79899 Other long term (current) drug therapy: Secondary | ICD-10-CM | POA: Insufficient documentation

## 2014-12-09 DIAGNOSIS — N189 Chronic kidney disease, unspecified: Secondary | ICD-10-CM | POA: Diagnosis not present

## 2014-12-09 HISTORY — DX: Acute myocardial infarction, unspecified: I21.9

## 2014-12-09 HISTORY — DX: Pulmonary hypertension, unspecified: I27.20

## 2014-12-09 HISTORY — DX: Pleural effusion, not elsewhere classified: J90

## 2014-12-09 HISTORY — PX: CHOLECYSTECTOMY: SHX55

## 2014-12-09 LAB — GLUCOSE, CAPILLARY: Glucose-Capillary: 179 mg/dL — ABNORMAL HIGH (ref 65–99)

## 2014-12-09 SURGERY — LAPAROSCOPIC CHOLECYSTECTOMY
Anesthesia: General | Wound class: Clean Contaminated

## 2014-12-09 MED ORDER — ACETAMINOPHEN 10 MG/ML IV SOLN
INTRAVENOUS | Status: AC
  Filled 2014-12-09: qty 100

## 2014-12-09 MED ORDER — HYDROCODONE-ACETAMINOPHEN 5-325 MG PO TABS: 1.0000 | ORAL_TABLET | ORAL | Status: DC | PRN

## 2014-12-09 MED ORDER — FAMOTIDINE 20 MG PO TABS
20.0000 mg | ORAL_TABLET | Freq: Once | ORAL | Status: AC
  Administered 2014-12-09: 20 mg via ORAL

## 2014-12-09 MED ORDER — PHENYLEPHRINE HCL 10 MG/ML IJ SOLN
INTRAMUSCULAR | Status: DC | PRN
  Administered 2014-12-09: 100 ug via INTRAVENOUS

## 2014-12-09 MED ORDER — DEXAMETHASONE SODIUM PHOSPHATE 4 MG/ML IJ SOLN
INTRAMUSCULAR | Status: DC | PRN
  Administered 2014-12-09: 10 mg via INTRAVENOUS

## 2014-12-09 MED ORDER — LIDOCAINE HCL 1 % IJ SOLN
INTRAMUSCULAR | Status: DC | PRN
  Administered 2014-12-09: 12 mL via INTRAMUSCULAR
  Administered 2014-12-09: 2 mL via INTRAMUSCULAR

## 2014-12-09 MED ORDER — LIDOCAINE HCL (CARDIAC) 20 MG/ML IV SOLN
INTRAVENOUS | Status: DC | PRN
  Administered 2014-12-09: 100 mg via INTRAVENOUS

## 2014-12-09 MED ORDER — SUGAMMADEX SODIUM 200 MG/2ML IV SOLN
INTRAVENOUS | Status: DC | PRN
  Administered 2014-12-09: 171.4 mg via INTRAVENOUS

## 2014-12-09 MED ORDER — GLYCOPYRROLATE 0.2 MG/ML IJ SOLN
INTRAMUSCULAR | Status: DC | PRN
  Administered 2014-12-09: 0.2 mg via INTRAVENOUS

## 2014-12-09 MED ORDER — LIDOCAINE HCL (PF) 1 % IJ SOLN
INTRAMUSCULAR | Status: AC
  Filled 2014-12-09: qty 30

## 2014-12-09 MED ORDER — CEFAZOLIN SODIUM-DEXTROSE 2-3 GM-% IV SOLR
2.0000 g | INTRAVENOUS | Status: AC
  Administered 2014-12-09: 2 g via INTRAVENOUS

## 2014-12-09 MED ORDER — CEFAZOLIN SODIUM-DEXTROSE 2-3 GM-% IV SOLR
INTRAVENOUS | Status: AC
  Filled 2014-12-09: qty 50

## 2014-12-09 MED ORDER — ONDANSETRON HCL 4 MG/2ML IJ SOLN
INTRAMUSCULAR | Status: DC | PRN
  Administered 2014-12-09: 4 mg via INTRAVENOUS

## 2014-12-09 MED ORDER — FENTANYL CITRATE (PF) 100 MCG/2ML IJ SOLN: 25.0000 ug | INTRAMUSCULAR | Status: DC | PRN

## 2014-12-09 MED ORDER — ONDANSETRON HCL 4 MG/2ML IJ SOLN: 4.0000 mg | Freq: Once | INTRAMUSCULAR | Status: DC | PRN

## 2014-12-09 MED ORDER — MIDAZOLAM HCL 2 MG/2ML IJ SOLN
INTRAMUSCULAR | Status: DC | PRN
  Administered 2014-12-09: 2 mg via INTRAVENOUS

## 2014-12-09 MED ORDER — FENTANYL CITRATE (PF) 100 MCG/2ML IJ SOLN
INTRAMUSCULAR | Status: DC | PRN
  Administered 2014-12-09: 150 ug via INTRAVENOUS
  Administered 2014-12-09: 50 ug via INTRAVENOUS

## 2014-12-09 MED ORDER — PROPOFOL 10 MG/ML IV BOLUS
INTRAVENOUS | Status: DC | PRN
  Administered 2014-12-09: 150 mg via INTRAVENOUS

## 2014-12-09 MED ORDER — SODIUM CHLORIDE 0.9 % IV SOLN
INTRAVENOUS | Status: DC
  Administered 2014-12-09 (×2): via INTRAVENOUS

## 2014-12-09 MED ORDER — ACETAMINOPHEN 10 MG/ML IV SOLN
INTRAVENOUS | Status: DC | PRN
  Administered 2014-12-09: 1000 mg via INTRAVENOUS

## 2014-12-09 MED ORDER — KETOROLAC TROMETHAMINE 30 MG/ML IJ SOLN
INTRAMUSCULAR | Status: DC | PRN
  Administered 2014-12-09: 30 mg via INTRAVENOUS

## 2014-12-09 MED ORDER — ROCURONIUM BROMIDE 100 MG/10ML IV SOLN
INTRAVENOUS | Status: DC | PRN
  Administered 2014-12-09: 10 mg via INTRAVENOUS

## 2014-12-09 MED ORDER — FAMOTIDINE 20 MG PO TABS
ORAL_TABLET | ORAL | Status: AC
  Administered 2014-12-09: 20 mg via ORAL
  Filled 2014-12-09: qty 1

## 2014-12-09 MED ORDER — BUPIVACAINE-EPINEPHRINE (PF) 0.25% -1:200000 IJ SOLN
INTRAMUSCULAR | Status: AC
  Filled 2014-12-09: qty 30

## 2014-12-09 MED ORDER — SUCCINYLCHOLINE CHLORIDE 20 MG/ML IJ SOLN
INTRAMUSCULAR | Status: DC | PRN
  Administered 2014-12-09: 120 mg via INTRAVENOUS

## 2014-12-09 SURGICAL SUPPLY — 51 items
APPLIER CLIP ROT 10 11.4 M/L (STAPLE) ×3
BAG COUNTER SPONGE EZ (MISCELLANEOUS) ×2 IMPLANT
BLADE SURG SZ11 CARB STEEL (BLADE) IMPLANT
BULB RESERV EVAC DRAIN JP 100C (MISCELLANEOUS) IMPLANT
CANISTER SUCT 1200ML W/VALVE (MISCELLANEOUS) ×3 IMPLANT
CATH REDDICK CHOLANGI 4FR 50CM (CATHETERS) IMPLANT
CHLORAPREP W/TINT 26ML (MISCELLANEOUS) ×3 IMPLANT
CLIP APPLIE ROT 10 11.4 M/L (STAPLE) ×1 IMPLANT
CLOSURE WOUND 1/2 X4 (GAUZE/BANDAGES/DRESSINGS) ×1
CONRAY 60ML FOR OR (MISCELLANEOUS) IMPLANT
COUNTER SPONGE BAG EZ (MISCELLANEOUS) ×1
DISSECTOR KITTNER STICK (MISCELLANEOUS) IMPLANT
DISSECTORS/KITTNER STICK (MISCELLANEOUS)
DRAIN CHANNEL JP 19F (MISCELLANEOUS) IMPLANT
DRAPE SHEET LG 3/4 BI-LAMINATE (DRAPES) ×3 IMPLANT
DRSG TEGADERM 2-3/8X2-3/4 SM (GAUZE/BANDAGES/DRESSINGS) ×12 IMPLANT
DRSG TELFA 3X8 NADH (GAUZE/BANDAGES/DRESSINGS) ×3 IMPLANT
ENDOLOOP SUT PDS II  0 18 (SUTURE)
ENDOLOOP SUT PDS II 0 18 (SUTURE) IMPLANT
ENDOPOUCH RETRIEVER 10 (MISCELLANEOUS) ×3 IMPLANT
GLOVE BIO SURGEON STRL SZ7.5 (GLOVE) ×9 IMPLANT
GLOVE EXAM NITRILE PF MED BLUE (GLOVE) ×3 IMPLANT
GLOVE INDICATOR 6.5 STRL GRN (GLOVE) ×3 IMPLANT
GOWN STRL REUS W/ TWL LRG LVL3 (GOWN DISPOSABLE) ×3 IMPLANT
GOWN STRL REUS W/TWL LRG LVL3 (GOWN DISPOSABLE) ×6
IRRIGATION STRYKERFLOW (MISCELLANEOUS) ×1 IMPLANT
IRRIGATOR STRYKERFLOW (MISCELLANEOUS) ×3
IV CATH ANGIO 12GX3 LT BLUE (NEEDLE) IMPLANT
IV NS 1000ML (IV SOLUTION) ×2
IV NS 1000ML BAXH (IV SOLUTION) ×1 IMPLANT
LABEL OR SOLS (LABEL) ×3 IMPLANT
LIQUID BAND (GAUZE/BANDAGES/DRESSINGS) IMPLANT
NEEDLE HYPO 25X1 1.5 SAFETY (NEEDLE) ×3 IMPLANT
NS IRRIG 500ML POUR BTL (IV SOLUTION) ×3 IMPLANT
PACK LAP CHOLECYSTECTOMY (MISCELLANEOUS) ×3 IMPLANT
PAD GROUND ADULT SPLIT (MISCELLANEOUS) ×3 IMPLANT
SCISSORS METZENBAUM CVD 33 (INSTRUMENTS) ×3 IMPLANT
SEAL FOR SCOPE WARMER C3101 (MISCELLANEOUS) IMPLANT
SLEEVE ENDOPATH XCEL 5M (ENDOMECHANICALS) ×3 IMPLANT
STRAP SAFETY BODY (MISCELLANEOUS) ×3 IMPLANT
STRIP CLOSURE SKIN 1/2X4 (GAUZE/BANDAGES/DRESSINGS) ×2 IMPLANT
SUT MNCRL 4-0 (SUTURE) ×2
SUT MNCRL 4-0 27XMFL (SUTURE) ×1
SUT VICRYL 0 AB UR-6 (SUTURE) ×6 IMPLANT
SUTURE MNCRL 4-0 27XMF (SUTURE) ×1 IMPLANT
SWABSTK COMLB BENZOIN TINCTURE (MISCELLANEOUS) IMPLANT
TROCAR XCEL BLUNT TIP 100MML (ENDOMECHANICALS) ×3 IMPLANT
TROCAR XCEL NON-BLD 11X100MML (ENDOMECHANICALS) ×3 IMPLANT
TROCAR XCEL NON-BLD 5MMX100MML (ENDOMECHANICALS) ×3 IMPLANT
TUBING INSUFFLATOR HI FLOW (MISCELLANEOUS) ×3 IMPLANT
WATER STERILE IRR 1000ML POUR (IV SOLUTION) IMPLANT

## 2014-12-09 NOTE — Progress Notes (Signed)
Surgery Progress Note  Doing well.  Nausea improved.  No changes to H and P.  Proceed with surgery.

## 2014-12-09 NOTE — Discharge Instructions (Signed)
Do not drive on pain medications Do not lift greater than 15 lbs for a period of 6 weeks Call or return to ER if you develop fever greater than 101.5, nausea/vomiting, increased pain, redness/drainage from incisions Take bandages off in 48 hours.  Okay to shower with bandages on or after they come off, no tub baths   AMBULATORY SURGERY  DISCHARGE INSTRUCTIONS   1) The drugs that you were given will stay in your system until tomorrow so for the next 24 hours you should not:  A) Drive an automobile B) Make any legal decisions C) Drink any alcoholic beverage   2) You may resume regular meals tomorrow.  Today it is better to start with liquids and gradually work up to solid foods.  You may eat anything you prefer, but it is better to start with liquids, then soup and crackers, and gradually work up to solid foods.   3) Please notify your doctor immediately if you have any unusual bleeding, trouble breathing, redness and pain at the surgery site, drainage, fever, or pain not relieved by medication.    4) Additional Instructions:        Please contact your physician with any problems or Same Day Surgery at [phone], Monday through Friday 6 am to 4 pm, or [place] [facility] number at [phone].

## 2014-12-09 NOTE — Transfer of Care (Signed)
Immediate Anesthesia Transfer of Care Note  Patient: Jamie Arias  Procedure(s) Performed: Procedure(s): LAPAROSCOPIC CHOLECYSTECTOMY (N/A)  Patient Location: PACU  Anesthesia Type:General  Level of Consciousness: sedated  Airway & Oxygen Therapy: Patient Spontanous Breathing and Patient connected to face mask oxygen  Post-op Assessment: Report given to RN and Post -op Vital signs reviewed and stable  Post vital signs: Reviewed and stable  Last Vitals:  Filed Vitals:   12/09/14 0954  BP: 196/83  Pulse: 62  Temp: 36.7 C  Resp: 16    Complications: No apparent anesthesia complications

## 2014-12-09 NOTE — Anesthesia Preprocedure Evaluation (Signed)
Anesthesia Evaluation  Patient identified by MRN, date of birth, ID band Patient awake    Reviewed: Allergy & Precautions, NPO status , Patient's Chart, lab work & pertinent test results  Airway Mallampati: III       Dental  (+) Partial Upper, Lower Dentures   Pulmonary    Pulmonary exam normal        Cardiovascular hypertension, Pt. on home beta blockers + CAD, + Past MI and +CHF  Normal cardiovascular exam     Neuro/Psych    GI/Hepatic negative GI ROS, Neg liver ROS,   Endo/Other  diabetes, Type 1, Insulin DependentHypothyroidism   Renal/GU      Musculoskeletal   Abdominal   Peds  Hematology  (+) anemia ,   Anesthesia Other Findings   Reproductive/Obstetrics                             Anesthesia Physical Anesthesia Plan  ASA: III  Anesthesia Plan: General   Post-op Pain Management:    Induction: Intravenous  Airway Management Planned: Oral ETT  Additional Equipment:   Intra-op Plan:   Post-operative Plan: Extubation in OR  Informed Consent: I have reviewed the patients History and Physical, chart, labs and discussed the procedure including the risks, benefits and alternatives for the proposed anesthesia with the patient or authorized representative who has indicated his/her understanding and acceptance.     Plan Discussed with: CRNA  Anesthesia Plan Comments:         Anesthesia Quick Evaluation

## 2014-12-09 NOTE — Brief Op Note (Signed)
12/09/2014  12:24 PM  PATIENT:  Jamie Arias  59 y.o. female  PRE-OPERATIVE DIAGNOSIS:  NAUSEA WITH OUT VOMITING  POST-OPERATIVE DIAGNOSIS:  cholecystitis  PROCEDURE:  Procedure(s): LAPAROSCOPIC CHOLECYSTECTOMY (N/A)  SURGEON:  Surgeon(s) and Role:    * Marlyce [name], MD - Primary  PHYSICIAN ASSISTANT:   ASSISTANTS: none   ANESTHESIA:   general  EBL:     BLOOD ADMINISTERED:none  DRAINS: none   LOCAL MEDICATIONS USED:  LIDOCAINE   SPECIMEN:  Excision  DISPOSITION OF SPECIMEN:  PATHOLOGY  COUNTS:  YES  TOURNIQUET:  * No tourniquets in log *  DICTATION: .Note written in EPIC  PLAN OF CARE: Discharge to home after PACU  PATIENT DISPOSITION:  PACU - hemodynamically stable.   Delay start of Pharmacological VTE agent (>24hrs) due to surgical blood loss or risk of bleeding: not applicable

## 2014-12-09 NOTE — Op Note (Signed)
Preop dx: Cholelithiasis Postop dx: Cholelithiasis, chronic cholecystitis Procedure performed: Laparoscopic cholecystectomy Anesthesia: General EBL: 20 ml Complications: None Specimen: gallbladder  Indication for surgery: Ms. Rheams is a pleasant 59 yo F who presents with recurrent RUQ pain, nausea and gallstones. Her pain thought to be biliary in nature so I offered cholecystectomy.  Details of surgery: Informed consent was obtained.  Ms. Sanderson was brought to the OR suite and laid supine on the OR table.  She was induced, ETT was placed, general anesthesia was administered.  Her abdomen was prepped and draped.  A timeout was performed correctly identifying patient name, operative site and procedure to be performed.  A supraumbilical incision was made and deepened to the fascia.  The fascia was incised, peritoneum was entered.  Two stay sutures were placed through the fasciotomy.  Hassan trocar was placed and abdomen was insufflated.  An 37mm epigastric and 2 5 mm subcostal trocars were placed.  Gallbladder was minimally inflamed but portal triad had significant fibrous scarring.  Cystic duct and cystic artery were dissected out and critical view was obtained.  Cystic artery and duct was clipped and ligated.  Gallbladder was then taken off fossa and removed through umbilicus. Fossa was made hemostatic and irrigated until hemostasis obtained.  Trocars were then removed and abdomen desufflated.  Supraumbilical fascia was then closed with previously placed stay sutures.  Skin was then closed with interrupted deep dermal 4-0 monocryl.  Suture strips, telfa and tegaderm were used to dress incision.  Patient was then awoken, extubated and brought to PACU.  There were no immediate complications. Needle, sponge and instrument count was correct at the end of the procedure.

## 2014-12-09 NOTE — Anesthesia Procedure Notes (Signed)
Procedure Name: Intubation Date/Time: 12/09/2014 11:01 AM Performed by: Nelda Marseille Pre-anesthesia Checklist: Patient identified, Patient being monitored, Timeout performed, Emergency Drugs available and Suction available Patient Re-evaluated:Patient Re-evaluated prior to inductionOxygen Delivery Method: Circle System Utilized Preoxygenation: Pre-oxygenation with 100% oxygen Intubation Type: IV induction Ventilation: Mask ventilation without difficulty Laryngoscope Size: Mac and 3 Grade View: Grade II Tube type: Oral Tube size: 7.0 mm Number of attempts: 1 Airway Equipment and Method: Stylet Placement Confirmation: ETT inserted through vocal cords under direct vision,  positive ETCO2 and breath sounds checked- equal and bilateral Secured at: 21 cm Tube secured with: Tape Dental Injury: Teeth and Oropharynx as per pre-operative assessment

## 2014-12-10 ENCOUNTER — Encounter: Payer: Self-pay | Admitting: Surgery

## 2014-12-10 LAB — SURGICAL PATHOLOGY

## 2014-12-10 NOTE — Anesthesia Postprocedure Evaluation (Signed)
  Anesthesia Post-op Note  Patient: Jamie Arias  Procedure(s) Performed: Procedure(s): LAPAROSCOPIC CHOLECYSTECTOMY (N/A)  Anesthesia type:General  Patient location: PACU  Post pain: Pain level controlled  Post assessment: Post-op Vital signs reviewed, Patient's Cardiovascular Status Stable, Respiratory Function Stable, Patent Airway and No signs of Nausea or vomiting  Post vital signs: Reviewed and stable  Last Vitals:  Filed Vitals:   12/09/14 1315  BP: 183/76  Pulse: 55  Temp:   Resp: 16    Level of consciousness: awake, alert  and patient cooperative  Complications: No apparent anesthesia complications

## 2014-12-15 ENCOUNTER — Ambulatory Visit (INDEPENDENT_AMBULATORY_CARE_PROVIDER_SITE_OTHER): Payer: 59 | Admitting: Surgery

## 2014-12-15 ENCOUNTER — Encounter: Payer: Self-pay | Admitting: Surgery

## 2014-12-15 ENCOUNTER — Telehealth: Payer: Self-pay

## 2014-12-15 VITALS — BP 125/75 | HR 65 | Temp 97.9°F | Ht 64.0 in | Wt 188.0 lb

## 2014-12-15 DIAGNOSIS — K5901 Slow transit constipation: Secondary | ICD-10-CM

## 2014-12-15 MED ORDER — POLYETHYLENE GLYCOL 3350 17 G PO PACK: 17.0000 g | PACK | Freq: Every day | ORAL | Status: DC

## 2014-12-15 NOTE — Telephone Encounter (Signed)
Patient wanted me to send her supervisor the disability certificate by fax. Patient gave me her supervisor's name and number Wells Guiles [phone], [phone] fax).  I then called Wells Guiles and told her that I was going to fax patient's disability certificate letter.

## 2014-12-15 NOTE — Patient Instructions (Signed)
Take Miralax 17 Grams daily to help you with your constipation. If you have any vomiting, nausea, fever higher than 101.5 F, please call us.

## 2014-12-15 NOTE — Progress Notes (Signed)
Surgery Progress Note  S: Min pain.  Min nausea/vomiting.  + constipation. O:Blood pressure 125/75, pulse 65, temperature 97.9 F (36.6 C), temperature source Oral, height 5\' 4"  (1.626 m), weight 188 lb (85.276 kg). GEN: NAD/A&Ox3 ABD: soft, min tender, nondistended, incisions c/d/i  A/P 59 yo s/p lap chole, doing well - no heavy lifting x 5 weeks - f/u prn

## 2014-12-16 ENCOUNTER — Ambulatory Visit (INDEPENDENT_AMBULATORY_CARE_PROVIDER_SITE_OTHER): Payer: 59

## 2014-12-16 DIAGNOSIS — R0989 Other specified symptoms and signs involving the circulatory and respiratory systems: Secondary | ICD-10-CM | POA: Diagnosis not present

## 2014-12-23 ENCOUNTER — Ambulatory Visit: Payer: 59 | Admitting: Cardiovascular Disease

## 2014-12-30 ENCOUNTER — Telehealth: Payer: Self-pay | Admitting: *Deleted

## 2014-12-30 NOTE — Telephone Encounter (Signed)
Spoke with patient, regarding diarrhea medications.

## 2014-12-30 NOTE — Telephone Encounter (Signed)
Please advise 

## 2014-12-30 NOTE — Telephone Encounter (Signed)
Patient's husband requested a medication for diarrhea, or a suggestion of what she can take

## 2014-12-30 NOTE — Telephone Encounter (Signed)
Imodium over the counter is the best choice. Watch fried, fatty foods.

## 2015-01-03 ENCOUNTER — Other Ambulatory Visit: Payer: Self-pay | Admitting: Cardiovascular Disease

## 2015-01-10 ENCOUNTER — Telehealth: Payer: Self-pay | Admitting: *Deleted

## 2015-01-10 NOTE — Telephone Encounter (Signed)
Pt notified of lab results

## 2015-01-10 NOTE — Telephone Encounter (Signed)
Patient has requested a call back for an OTC medication for a cough and cold, patient has high blood pressure and diabetes.

## 2015-01-10 NOTE — Telephone Encounter (Signed)
Do you recommend Coricidin HBP?

## 2015-01-10 NOTE — Telephone Encounter (Signed)
Perfect! Coricidin HBP is great.

## 2015-01-10 NOTE — Telephone Encounter (Signed)
Pt informed of medicine to take

## 2015-01-26 ENCOUNTER — Other Ambulatory Visit: Payer: Self-pay | Admitting: Nurse Practitioner

## 2015-01-26 ENCOUNTER — Other Ambulatory Visit: Payer: Self-pay | Admitting: Cardiovascular Disease

## 2015-01-29 ENCOUNTER — Other Ambulatory Visit: Payer: Self-pay | Admitting: Nurse Practitioner

## 2015-01-31 ENCOUNTER — Other Ambulatory Visit: Payer: Self-pay

## 2015-02-02 ENCOUNTER — Other Ambulatory Visit: Payer: Self-pay | Admitting: Nurse Practitioner

## 2015-02-08 ENCOUNTER — Ambulatory Visit (INDEPENDENT_AMBULATORY_CARE_PROVIDER_SITE_OTHER): Payer: 59 | Admitting: Nurse Practitioner

## 2015-02-08 VITALS — BP 140/88 | HR 67 | Temp 98.0°F

## 2015-02-08 DIAGNOSIS — R790 Abnormal level of blood mineral: Secondary | ICD-10-CM

## 2015-02-08 DIAGNOSIS — R10816 Epigastric abdominal tenderness: Secondary | ICD-10-CM | POA: Diagnosis not present

## 2015-02-08 DIAGNOSIS — E1140 Type 2 diabetes mellitus with diabetic neuropathy, unspecified: Secondary | ICD-10-CM | POA: Diagnosis not present

## 2015-02-08 DIAGNOSIS — E039 Hypothyroidism, unspecified: Secondary | ICD-10-CM | POA: Diagnosis not present

## 2015-02-08 DIAGNOSIS — K5901 Slow transit constipation: Secondary | ICD-10-CM

## 2015-02-08 MED ORDER — METFORMIN HCL 500 MG PO TABS: 500.0000 mg | ORAL_TABLET | Freq: Two times a day (BID) | ORAL | Status: DC

## 2015-02-08 MED ORDER — PROMETHAZINE HCL 12.5 MG PO TABS: 12.5000 mg | ORAL_TABLET | Freq: Four times a day (QID) | ORAL | Status: DC | PRN

## 2015-02-08 NOTE — Patient Instructions (Signed)
Phenergan for your nausea Eat healthy low fat foods, bland foods will be helpful at this time If you do not have a bowel movement by tonight take Miralax  Nausea, Adult Nausea is the feeling that you have an upset stomach or have to vomit. Nausea by itself is not likely a serious concern, but it may be an early sign of more serious medical problems. As nausea gets worse, it can lead to vomiting. If vomiting develops, there is the risk of dehydration.  CAUSES   Viral infections.  Food poisoning.  Medicines.  Pregnancy.  Motion sickness.  Migraine headaches.  Emotional distress.  Severe pain from any source.  Alcohol intoxication. HOME CARE INSTRUCTIONS  Get plenty of rest.  Ask your caregiver about specific rehydration instructions.  Eat small amounts of food and sip liquids more often.  Take all medicines as told by your caregiver. SEEK MEDICAL CARE IF:  You have not improved after 2 days, or you get worse.  You have a headache. SEEK IMMEDIATE MEDICAL CARE IF:   You have a fever.  You faint.  You keep vomiting or have blood in your vomit.  You are extremely weak or dehydrated.  You have dark or bloody stools.  You have severe chest or abdominal pain. MAKE SURE YOU:  Understand these instructions.  Will watch your condition.  Will get help right away if you are not doing well or get worse.   This information is not intended to replace advice given to you by your health care provider. Make sure you discuss any questions you have with your health care provider.   Document Released: 03/29/2004 Document Revised: 03/12/2014 Document Reviewed: 11/01/2010 Elsevier Interactive Patient Education [employer].

## 2015-02-08 NOTE — Progress Notes (Signed)
[name], female    DOB: Feb 16, 1956  Age: 59 y.o. MRN: [id number]  CC: Abdominal Pain   HPI Jamie Arias presents for CC of abdominal pain x 4 days. She is accompanied by her husband today.  1) Abdominal US in September was positive for contracted gallbladder with multiple stones, no other findings. Recent (sept.) laparoscopic.  cholecystectomy   2) Pt reports recurring abdominal pain Epigastric area since Saturday.  Last Bowel Movement- Saturday Recent foods- Had BBQ on Saturday and reports it was greasy   Had "diarrhea" loose stools that morning before eating the barbeque x 3. Felt nauseated after eating and leaving. Epigastric area.  Lots of family stressors and job stressors  Treatment to date: Pepto twice last night total 3-4 x- no relief Imodium- Saturday   Out of metformin and they report [facility] said she needed to be seen first.   History [place] has a past medical history of Diabetes mellitus without complication ([place]); Hypertension; Hyperlipidemia; Hypothyroidism; Diabetic neuropathy ([place]); Chronic systolic heart failure ([place]); Anemia; Coronary artery disease; Myocardial infarction ([place]) (2015); CHF (congestive heart failure) ([place]); Chronic kidney disease; Pulmonary hypertension ([place]); and Pleural effusion (2015).   She has past surgical history that includes Eye surgery; Coronary artery bypass graft (N/A, 07/02/2013); Intraoprative transesophageal echocardiogram (N/A, 07/02/2013); Thoracentesis (Left, 2015); and Cholecystectomy (N/A, 12/09/2014).   Her family history includes COPD in her mother; Cancer in her mother; Pulmonary embolism in her father.She reports that she has never smoked. She has never used smokeless tobacco. She reports that she does not drink alcohol or use illicit drugs.  Outpatient Prescriptions Prior to Visit  Medication Sig Dispense Refill  . aspirin EC 81 MG tablet Take 1 tablet (81 mg total) by mouth daily. 90 tablet 3  . atorvastatin  (LIPITOR) 80 MG tablet Take 80 mg by mouth daily with lunch.     Marland Kitchen atorvastatin (LIPITOR) 80 MG tablet TAKE ONE TABLET BY MOUTH ONCE DAILY 90 tablet 3  . blood glucose meter kit and supplies KIT Dispense based on patient and insurance preference. Use up to four times daily as directed. (FOR ICD-9 250.00, 250.01). 1 each 0  . carvedilol (COREG) 6.25 MG tablet TAKE ONE TABLET BY MOUTH TWICE DAILY 180 tablet 3  . furosemide (LASIX) 20 MG tablet Take 1 tablet (20 mg total) by mouth daily. (Patient taking differently: Take 10 mg by mouth daily. ) 90 tablet 3  . gabapentin (NEURONTIN) 300 MG capsule TAKE ONE CAPSULE BY MOUTH THREE TIMES DAILY 90 capsule 6  . glucose blood test strip Test 4 times daily Dx. E11.9. One Touch ultra strips 100 each 12  . HYDROcodone-acetaminophen (NORCO) 5-325 MG tablet Take 1-2 tablets by mouth every 4 (four) hours as needed for moderate pain or severe pain. 35 tablet 0  . Insulin Lispro Prot & Lispro (HUMALOG MIX 75/25 KWIKPEN) (75-25) 100 UNIT/ML Kwikpen Inject 0.08 mLs (8 Units total) into the skin 2 (two) times daily. 15 mL 3  . Insulin Pen Needle 32G X 6 MM MISC Use with Humalog Kwikpen. 50 each 6  . Insulin Syringe-Needle U-100 (INSULIN SYRINGE .5CC/30GX1/2") 30G X 1/2" 0.5 ML MISC Use as directed 100 each 5  . levothyroxine (SYNTHROID, LEVOTHROID) 50 MCG tablet TAKE ONE TABLET BY MOUTH ONCE DAILY 30 tablet 11  . lisinopril (PRINIVIL,ZESTRIL) 20 MG tablet TAKE ONE TABLET BY MOUTH ONCE DAILY 30 tablet 2  . nitroGLYCERIN (NITROSTAT) 0.4 MG SL tablet Place 1 tablet (0.4 mg total) under  the tongue every 5 (five) minutes as needed for chest pain. 25 tablet 3  . polyethylene glycol (MIRALAX / GLYCOLAX) packet Take 17 g by mouth daily. 30 each 0  . scopolamine (TRANSDERM-SCOP, 1.5 MG,) 1 MG/3DAYS Place 1 patch (1.5 mg total) onto the skin every 3 (three) days. 10 patch 0  . metFORMIN (GLUCOPHAGE) 500 MG tablet TAKE ONE TABLET BY MOUTH TWICE DAILY WITH MEALS 60 tablet 0  .  ondansetron (ZOFRAN ODT) 4 MG disintegrating tablet 85m ODT q4 hours prn nausea/vomit 8 tablet 3  . traZODone (DESYREL) 50 MG tablet Take 1 tablet (50 mg total) by mouth at bedtime as needed for sleep. (Patient taking differently: Take 25 mg by mouth at bedtime as needed for sleep. ) 30 tablet 4   No facility-administered medications prior to visit.    ROS Review of Systems  Constitutional: Negative for fever, chills, diaphoresis and fatigue.  Respiratory: Negative for chest tightness, shortness of breath and wheezing.   Cardiovascular: Negative for chest pain, palpitations and leg swelling.  Gastrointestinal: Positive for nausea, abdominal pain and abdominal distention. Negative for vomiting and diarrhea.  Skin: Negative for rash.  Neurological: Negative for dizziness, weakness, numbness and headaches.  Psychiatric/Behavioral: The patient is not nervous/anxious.     Objective:  BP 140/88 mmHg  Pulse 67  Temp(Src) 98 F (36.7 C) (Oral)  SpO2 95%  Physical Exam  Constitutional: She is oriented to person, place, and time. She appears well-developed and well-nourished. No distress.  HENT:  Head: Normocephalic and atraumatic.  Right Ear: External ear normal.  Left Ear: External ear normal.  Eyes: No scleral icterus.  Abdominal: Soft. Bowel sounds are normal. She exhibits no distension and no mass. There is tenderness. There is no rebound and no guarding.  Slight tenderness in epigastric area  Neurological: She is alert and oriented to person, place, and time. No cranial nerve deficit. She exhibits normal muscle tone. Coordination normal.  Skin: Skin is warm and dry. No rash noted. She is not diaphoretic.  Psychiatric: She has a normal mood and affect. Her behavior is normal. Judgment and thought content normal.   Assessment & Plan:   LMeronwas seen today for abdominal pain.  Diagnoses and all orders for this visit:  Epigastric abdominal tenderness -     Comp Met (CMET) -      CBC w/Diff  Low magnesium levels -     Magnesium  Type 2 diabetes mellitus with diabetic neuropathy, without long-term current use of insulin (HCC) -     HgB A1c  Hypothyroidism, unspecified hypothyroidism type -     TSH -     T4, free  Slow transit constipation  Other orders -     promethazine (PHENERGAN) 12.5 MG tablet; Take 1 tablet (12.5 mg total) by mouth every 6 (six) hours as needed for nausea or vomiting. -     metFORMIN (GLUCOPHAGE) 500 MG tablet; Take 1 tablet (500 mg total) by mouth 2 (two) times daily with a meal.   I have discontinued Ms. KAmicoondansetron. I have also changed her metFORMIN. Additionally, I am having her start on promethazine. Lastly, I am having her maintain her nitroGLYCERIN, INSULIN SYRINGE .5CC/30GX1/2", furosemide, blood glucose meter kit and supplies, glucose blood, gabapentin, Insulin Lispro Prot & Lispro, Insulin Pen Needle, levothyroxine, atorvastatin, scopolamine, aspirin EC, HYDROcodone-acetaminophen, polyethylene glycol, carvedilol, lisinopril, and atorvastatin.  Meds ordered this encounter  Medications  . promethazine (PHENERGAN) 12.5 MG tablet    Sig: Take 1  tablet (12.5 mg total) by mouth every 6 (six) hours as needed for nausea or vomiting.    Dispense:  30 tablet    Refill:  0    Order Specific Question:  Supervising Provider    Answer:  Deborra Medina L [[id number]]  . metFORMIN (GLUCOPHAGE) 500 MG tablet    Sig: Take 1 tablet (500 mg total) by mouth 2 (two) times daily with a meal.    Dispense:  60 tablet    Refill:  0    Order Specific Question:  Supervising Provider    Answer:  Crecencio Mc [[id number]]     Follow-up: Return in about 4 weeks (around 03/08/2015) for CPE w/ fasting labs.

## 2015-02-13 ENCOUNTER — Other Ambulatory Visit: Payer: Self-pay | Admitting: Nurse Practitioner

## 2015-02-16 ENCOUNTER — Encounter: Payer: Self-pay | Admitting: Nurse Practitioner

## 2015-02-16 NOTE — Assessment & Plan Note (Addendum)
This has somewhat resolved with the lap chole in sept. Happened again starting Sunday and loose stool. Will check CMET, Script for phenergan sent to pharmacy, advised bland non-greasy foods, find ways to cope with stress. Handout was given on AVS and reviewed with pt. She is to come in 4 weeks for a physical.   Aortic concerns were ruled out due to well appearing, minimal distress at visit, and recent surgery

## 2015-02-16 NOTE — Assessment & Plan Note (Signed)
Thyroid panel checked today

## 2015-02-16 NOTE — Assessment & Plan Note (Signed)
Advised miralax if not BM by tonight

## 2015-02-16 NOTE — Assessment & Plan Note (Signed)
Refilled metformin and will check A1c today

## 2015-02-28 ENCOUNTER — Other Ambulatory Visit: Payer: Self-pay | Admitting: Nurse Practitioner

## 2015-03-01 ENCOUNTER — Other Ambulatory Visit: Payer: Self-pay | Admitting: Nurse Practitioner

## 2015-03-01 NOTE — Telephone Encounter (Signed)
Please advise? Seen on the 02/08/15 and refilled medication in April with 6 refills

## 2015-03-02 NOTE — Telephone Encounter (Signed)
Was filled on 12/12, for 30 day supply, refill is too early?

## 2015-03-08 ENCOUNTER — Emergency Department: Payer: Self-pay

## 2015-03-08 ENCOUNTER — Encounter: Payer: Self-pay | Admitting: *Deleted

## 2015-03-08 ENCOUNTER — Emergency Department
Admission: EM | Admit: 2015-03-08 | Discharge: 2015-03-08 | Disposition: A | Discharge status: Home / Self Care | Payer: Self-pay | Attending: Emergency Medicine | Admitting: Emergency Medicine

## 2015-03-08 DIAGNOSIS — F419 Anxiety disorder, unspecified: Secondary | ICD-10-CM | POA: Insufficient documentation

## 2015-03-08 DIAGNOSIS — Z794 Long term (current) use of insulin: Secondary | ICD-10-CM | POA: Insufficient documentation

## 2015-03-08 DIAGNOSIS — Z7984 Long term (current) use of oral hypoglycemic drugs: Secondary | ICD-10-CM | POA: Insufficient documentation

## 2015-03-08 DIAGNOSIS — Z7982 Long term (current) use of aspirin: Secondary | ICD-10-CM | POA: Insufficient documentation

## 2015-03-08 DIAGNOSIS — E1140 Type 2 diabetes mellitus with diabetic neuropathy, unspecified: Secondary | ICD-10-CM | POA: Insufficient documentation

## 2015-03-08 DIAGNOSIS — K529 Noninfective gastroenteritis and colitis, unspecified: Secondary | ICD-10-CM

## 2015-03-08 DIAGNOSIS — N189 Chronic kidney disease, unspecified: Secondary | ICD-10-CM | POA: Insufficient documentation

## 2015-03-08 DIAGNOSIS — Z79899 Other long term (current) drug therapy: Secondary | ICD-10-CM | POA: Insufficient documentation

## 2015-03-08 DIAGNOSIS — I129 Hypertensive chronic kidney disease with stage 1 through stage 4 chronic kidney disease, or unspecified chronic kidney disease: Secondary | ICD-10-CM | POA: Insufficient documentation

## 2015-03-08 DIAGNOSIS — R109 Unspecified abdominal pain: Secondary | ICD-10-CM

## 2015-03-08 LAB — COMPREHENSIVE METABOLIC PANEL WITH GFR
ALT: 17 U/L (ref 14–54)
AST: 21 U/L (ref 15–41)
Albumin: 3.1 g/dL — ABNORMAL LOW (ref 3.5–5.0)
Alkaline Phosphatase: 70 U/L (ref 38–126)
Anion gap: 9 (ref 5–15)
BUN: 25 mg/dL — ABNORMAL HIGH (ref 6–20)
CO2: 24 mmol/L (ref 22–32)
Calcium: 9.1 mg/dL (ref 8.9–10.3)
Chloride: 108 mmol/L (ref 101–111)
Creatinine, Ser: 1.74 mg/dL — ABNORMAL HIGH (ref 0.44–1.00)
GFR calc Af Amer: 35 mL/min — ABNORMAL LOW
GFR calc non Af Amer: 30 mL/min — ABNORMAL LOW
Glucose, Bld: 200 mg/dL — ABNORMAL HIGH (ref 65–99)
Potassium: 3.9 mmol/L (ref 3.5–5.1)
Sodium: 141 mmol/L (ref 135–145)
Total Bilirubin: 0.9 mg/dL (ref 0.3–1.2)
Total Protein: 6.6 g/dL (ref 6.5–8.1)

## 2015-03-08 LAB — URINALYSIS COMPLETE WITH MICROSCOPIC (ARMC ONLY)
Bilirubin Urine: NEGATIVE
Glucose, UA: >500 mg/dL — AB
Hgb urine dipstick: NEGATIVE
Leukocytes, UA: NEGATIVE
Nitrite: NEGATIVE
Protein, ur: >500 mg/dL — AB
Specific Gravity, Urine: 1.021 (ref 1.005–1.030)
pH: 6 (ref 5.0–8.0)

## 2015-03-08 LAB — CBC WITH DIFFERENTIAL/PLATELET
Basophils Absolute: 0.1 K/uL (ref 0–0.1)
Basophils Relative: 1 %
Eosinophils Absolute: 0 K/uL (ref 0–0.7)
Eosinophils Relative: 0 %
HCT: 29.2 % — ABNORMAL LOW (ref 35.0–47.0)
Hemoglobin: 9.9 g/dL — ABNORMAL LOW (ref 12.0–16.0)
Lymphocytes Relative: 9 %
Lymphs Abs: 0.8 K/uL — ABNORMAL LOW (ref 1.0–3.6)
MCH: 29.4 pg (ref 26.0–34.0)
MCHC: 34.1 g/dL (ref 32.0–36.0)
MCV: 86.3 fL (ref 80.0–100.0)
Monocytes Absolute: 0.3 K/uL (ref 0.2–0.9)
Monocytes Relative: 4 %
Neutro Abs: 7.8 K/uL — ABNORMAL HIGH (ref 1.4–6.5)
Neutrophils Relative %: 86 %
Platelets: 237 K/uL (ref 150–440)
RBC: 3.38 MIL/uL — ABNORMAL LOW (ref 3.80–5.20)
RDW: 14.8 % — ABNORMAL HIGH (ref 11.5–14.5)
WBC: 9 K/uL (ref 3.6–11.0)

## 2015-03-08 LAB — TROPONIN I: Troponin I: 0.03 ng/mL (ref <0.031)

## 2015-03-08 LAB — LIPASE, BLOOD: Lipase: 13 U/L (ref 11–51)

## 2015-03-08 LAB — LACTIC ACID, PLASMA: Lactic Acid, Venous: 1.3 mmol/L (ref 0.5–2.0)

## 2015-03-08 MED ORDER — IOHEXOL 240 MG/ML SOLN
25.0000 mL | Freq: Once | INTRAMUSCULAR | Status: AC | PRN
  Administered 2015-03-08: 25 mL via ORAL

## 2015-03-08 MED ORDER — HYDROMORPHONE HCL 1 MG/ML IJ SOLN
1.0000 mg | Freq: Once | INTRAMUSCULAR | Status: AC
  Administered 2015-03-08: 1 mg via INTRAVENOUS
  Filled 2015-03-08: qty 1

## 2015-03-08 MED ORDER — ONDANSETRON HCL 4 MG/2ML IJ SOLN
4.0000 mg | Freq: Once | INTRAMUSCULAR | Status: AC
  Administered 2015-03-08: 4 mg via INTRAVENOUS
  Filled 2015-03-08: qty 2

## 2015-03-08 MED ORDER — ONDANSETRON HCL 4 MG PO TABS: 4.0000 mg | ORAL_TABLET | Freq: Every day | ORAL | Status: DC | PRN

## 2015-03-08 MED ORDER — SODIUM CHLORIDE 0.9 % IV BOLUS (SEPSIS): 1000.0000 mL | Freq: Once | INTRAVENOUS | Status: DC

## 2015-03-08 MED ORDER — IOHEXOL 300 MG/ML  SOLN: 80.0000 mL | Freq: Once | INTRAMUSCULAR | Status: DC | PRN

## 2015-03-08 MED ORDER — IOHEXOL 240 MG/ML SOLN
25.0000 mL | Freq: Once | INTRAMUSCULAR | Status: AC | PRN
  Administered 2015-03-08: 50 mL via ORAL

## 2015-03-08 MED ORDER — SODIUM CHLORIDE 0.9 % IV BOLUS (SEPSIS)
1000.0000 mL | Freq: Once | INTRAVENOUS | Status: AC
  Administered 2015-03-08: 1000 mL via INTRAVENOUS

## 2015-03-08 NOTE — ED Notes (Signed)
Pt appears more relaxed now and says discomfort is much betternow.

## 2015-03-08 NOTE — ED Provider Notes (Addendum)
[facility] Emergency Department Provider Note  ____________________________________________   I have reviewed the triage vital signs and the nursing notes.   HISTORY  Chief Complaint Abdominal Pain    HPI Jamie Arias is a 60 y.o. female Complains of nausea vomiting and diarrhea since 3 days ago. Epigastric abdominal pain has been getting gradually worse since that time. Had multiple episode of diarrhea even today. Patient has multiple medical problems occluding CAD diabetes hypertension. She has had her gallbladder out in the past. She denies any fever or chills.she denies cough chest pain or shortness of breath.  Past Medical History  Diagnosis Date  . Diabetes mellitus without complication ([place])   . Hypertension   . Hyperlipidemia   . Hypothyroidism   . Diabetic neuropathy ([place])   . Chronic systolic heart failure ([place])     a. Due to ischemic cardiomyopathy. EF as low as 35%, improved to normal s/p CABG; b. echo 07/06/13: EF 55-60%, no RWMAs, mod TR, trivial pericardial effusion not c/w tamponade physiology  . Anemia   . Coronary artery disease     a. NSTEMI 06/2013; b.cath: severe three-vessel CAD w/ EF 30% & mild-mod MR; c. s/p 3 vessel CABG 07/02/13 (LIMA-LAD, SVG-OM, and SVG-RPDA).  . Myocardial infarction ([place]) 2015  . CHF (congestive heart failure) ([place])   . Chronic kidney disease     EFGR 31  . Pulmonary hypertension ([place])   . Pleural effusion 2015    Patient Active Problem List   Diagnosis Date Noted  . Calculus of gallbladder with chronic cholecystitis without obstruction   . Pre-operative cardiovascular examination 11/30/2014  . Bilateral carotid bruits 11/30/2014  . Epigastric abdominal tenderness 11/11/2014  . Low magnesium levels 08/10/2013  . Anemia 08/10/2013  . Constipation 08/10/2013  . Postoperative atrial fibrillation ([place]) 07/30/2013  . Coronary artery disease   . Chronic systolic heart failure ([place])   . CAD (coronary  artery disease) 07/02/2013  . Acute systolic heart failure ([place]) 06/30/2013  . NSTEMI (non-ST elevated myocardial infarction) ([place]) 06/29/2013  . Need for pneumococcal vaccination 10/24/2012  . Osteoporosis screening 10/24/2012  . Sleep disorder 05/23/2012  . Need for Tdap vaccination 04/27/2012  . Breast cancer screening 04/27/2012  . Colon cancer screening 04/27/2012  . Hypothyroid 04/27/2012  . HTN (hypertension) 04/27/2012  . HLD (hyperlipidemia) 04/27/2012  . Diabetes mellitus, type 2 ([place]) 04/27/2012  . Neuropathy ([place]) 04/27/2012    Past Surgical History  Procedure Laterality Date  . Eye surgery      Cataract both eyes  . Coronary artery bypass graft N/A 07/02/2013    Procedure: CORONARY ARTERY BYPASS GRAFTING (CABG);  Surgeon: Ivin [name], MD;  Location: [place];  Service: Open Heart Surgery;  Laterality: N/A;  CABG x three, using left internal mammary artery and right leg greater saphenous vein harvested endoscopically  . Intraoperative transesophageal echocardiogram N/A 07/02/2013    Procedure: INTRAOPERATIVE TRANSESOPHAGEAL ECHOCARDIOGRAM;  Surgeon: Ivin [name], MD;  Location: [name];  Service: Open Heart Surgery;  Laterality: N/A;  . Thoracentesis Left 2015  . Cholecystectomy N/A 12/09/2014    Procedure: LAPAROSCOPIC CHOLECYSTECTOMY;  Surgeon: Marlyce [name], MD;  Location: [facility];  Service: General;  Laterality: N/A;    Current Outpatient Rx  Name  Route  Sig  Dispense  Refill  . aspirin EC 81 MG tablet   Oral   Take 1 tablet (81 mg total) by mouth daily.   90 tablet   3   . atorvastatin (LIPITOR)  80 MG tablet      TAKE ONE TABLET BY MOUTH ONCE DAILY   90 tablet   3   . blood glucose meter kit and supplies KIT      Dispense based on patient and insurance preference. Use up to four times daily as directed. (FOR ICD-9 250.00, 250.01).   1 each   0     Insurance prefers onetouch ultra meter.   . carvedilol (COREG) 6.25 MG tablet      TAKE ONE  TABLET BY MOUTH TWICE DAILY   180 tablet   3   . furosemide (LASIX) 20 MG tablet   Oral   Take 1 tablet (20 mg total) by mouth daily. Patient taking differently: Take 10 mg by mouth daily.    90 tablet   3   . gabapentin (NEURONTIN) 300 MG capsule      TAKE ONE CAPSULE BY MOUTH THREE TIMES DAILY   90 capsule   0   . glucose blood test strip      Test 4 times daily Dx. E11.9. One Touch ultra strips   100 each   12   . HYDROcodone-acetaminophen (NORCO) 5-325 MG tablet   Oral   Take 1-2 tablets by mouth every 4 (four) hours as needed for moderate pain or severe pain.   35 tablet   0   . Insulin Lispro Prot & Lispro (HUMALOG MIX 75/25 KWIKPEN) (75-25) 100 UNIT/ML Kwikpen      Inject 0.08 mLs (8 Units total) into the skin 2 (two) times daily.   15 mL   3   . Insulin Pen Needle 32G X 6 MM MISC      Use with Humalog Kwikpen.   50 each   6   . Insulin Syringe-Needle U-100 (INSULIN SYRINGE .5CC/30GX1/2") 30G X 1/2" 0.5 ML MISC      Use as directed   100 each   5   . levothyroxine (SYNTHROID, LEVOTHROID) 50 MCG tablet      TAKE ONE TABLET BY MOUTH ONCE DAILY   30 tablet   11   . lisinopril (PRINIVIL,ZESTRIL) 20 MG tablet      TAKE ONE TABLET BY MOUTH ONCE DAILY   30 tablet   2   . metFORMIN (GLUCOPHAGE) 500 MG tablet   Oral   Take 1 tablet (500 mg total) by mouth 2 (two) times daily with a meal.   60 tablet   0   . nitroGLYCERIN (NITROSTAT) 0.4 MG SL tablet   Sublingual   Place 1 tablet (0.4 mg total) under the tongue every 5 (five) minutes as needed for chest pain.   25 tablet   3   . polyethylene glycol (MIRALAX / GLYCOLAX) packet   Oral   Take 17 g by mouth daily.   30 each   0   . promethazine (PHENERGAN) 12.5 MG tablet   Oral   Take 1 tablet (12.5 mg total) by mouth every 6 (six) hours as needed for nausea or vomiting.   30 tablet   0   . traZODone (DESYREL) 50 MG tablet      TAKE ONE TABLET BY MOUTH AT BEDTIME AS NEEDED FOR SLEEP   30  tablet   0   . atorvastatin (LIPITOR) 80 MG tablet   Oral   Take 80 mg by mouth daily with lunch.          . scopolamine (TRANSDERM-SCOP, 1.5 MG,) 1 MG/3DAYS   Transdermal   Place  1 patch (1.5 mg total) onto the skin every 3 (three) days.   10 patch   0     Allergies Review of patient's allergies indicates no known allergies.  Family History  Problem Relation Age of Onset  . COPD Mother   . Cancer Mother     Lung  . Pulmonary embolism Father     Social History Social History  Substance Use Topics  . Smoking status: Never Smoker   . Smokeless tobacco: Never Used  . Alcohol Use: No    Review of Systems Constitutional: No fever/chills Eyes: No visual changes. ENT: No sore throat. No stiff neck no neck pain Cardiovascular: Denies chest pain. Respiratory: Denies shortness of breath. Gastrointestinal:   no vomiting.  No diarrhea.  No constipation. Genitourinary: Negative for dysuria. Musculoskeletal: Negative lower extremity swelling Skin: Negative for rash. Neurological: Negative for headaches, focal weakness or numbness. 10-point ROS otherwise negative.  ____________________________________________   PHYSICAL EXAM:  VITAL SIGNS: ED Triage Vitals  Enc Vitals Group     BP 03/08/15 0844 155/96 mmHg     Pulse Rate 03/08/15 0844 78     Resp 03/08/15 0844 20     Temp 03/08/15 0844 97.6 F (36.4 C)     Temp Source 03/08/15 0844 Oral     SpO2 03/08/15 0844 98 %     Weight 03/08/15 0844 210 lb (95.255 kg)     Height 03/08/15 0844 5' 4" (1.626 m)     Head Cir --      Peak Flow --      Pain Score 03/08/15 0845 9     Pain Loc --      Pain Edu? --      Excl. in [place]? --     Constitutional: Alert and oriented. Patient is very anxious and upset but nontoxic. She states she was going to vomit during the interview but does not. Eyes: Conjunctivae are normal. PERRL. EOMI. Head: Atraumatic. Nose: No congestion/rhinnorhea. Mouth/Throat: Mucous membranes are moist.   Oropharynx non-erythematous. Neck: No stridor.   Nontender with no meningismus Cardiovascular: Normal rate, regular rhythm. Grossly normal heart sounds.  Good peripheral circulation. Respiratory: Normal respiratory effort.  No retractions. Lungs CTAB. Abdominal: Soft and minimally tender in the epigastric region. No distention. No guarding no rebound Back:  There is no focal tenderness or step off there is no midline tenderness there are no lesions noted. there is no CVA tenderness Musculoskeletal: No lower extremity tenderness. No joint effusions, no DVT signs strong distal pulses no edema Neurologic:  Normal speech and language. No gross focal neurologic deficits are appreciated.  Skin:  Skin is warm, dry and intact. No rash noted. Psychiatric: Mood and affect are veryanxious. Speech and behavior are normal.  ____________________________________________   LABS (all labs ordered are listed, but only abnormal results are displayed)  Labs Reviewed  COMPREHENSIVE METABOLIC PANEL  CBC WITH DIFFERENTIAL/PLATELET  LIPASE, BLOOD  URINALYSIS COMPLETEWITH MICROSCOPIC ([place])  TROPONIN I  LACTIC ACID, PLASMA  LACTIC ACID, PLASMA   ____________________________________________  EKG  I personally interpreted any EKGs ordered by me or triage Normal sinus rhythm at 76 no acute ST elevation or acute ST depression nonspecific ST changes noted. ____________________________________________  [id number]  I reviewed any imaging ordered by me or triage that were performed during my shift ____________________________________________   PROCEDURES  Procedure(s) performed: None  Critical Care performed: None  ____________________________________________   INITIAL IMPRESSION / ASSESSMENT AND PLAN / ED COURSE  Pertinent labs &  imaging results that were available during my care of the patient were reviewed by me and considered in my medical decision making (see chart for  details).  Patient with nausea vomiting diarrhea and epigastric abdominal pain which is somewhat reproducible. Give her pain medication and IV fluid. We'll give her nausea medication and reassess blood work. Patient does not have a surgical abdomen at this time. With the significant diarrheal aspect of this I have low suspicion for intrathoracic pathology such as PE ACS or dissection but I will obtain a troponin as she has had symptoms for 3 days and she has a history of heart disease. Do not believe this likely represents dissection for similar reasons. We will evaluate her closely and treat her symptomatically and evaluate for the presence of more significant pathology.she has a nonsurgical abdomen at this time  ----------------------------------------- 10:09 AM on 03/08/2015 -----------------------------------------  Patient much more comfortable, abdominal exam is benign at this time.  ----------------------------------------- 1:06 PM on 03/08/2015 -----------------------------------------  Serial abdominal exams are completely benign. No evidence of acute ipathology. Patient eager to try by mouth and go home.At this time, there does not appear to be clinical evidence to support the diagnosis of pulmonary embolus, dissection, myocarditis, endocarditis, pericarditis, pericardial tamponade, acute coronary syndrome, pneumothorax, pneumonia, or any other acute intrathoracic pathology that will require admission or acute intervention. Nor is there evidence of any surgical pathology. The patient has GI bug and she states 10 people atwork have the exact same thing. Return precautions and follow-up given and understood. Patient does have trace pleural effusions and she has been made aware of this she will follow-up with her doctor they have been there since 2015 at least. ____________________________________________  ----------------------------------------- 2:08 PM on  03/08/2015 -----------------------------------------  Patient did not take her blood pressure medication she has no chest pain or shortness of breath no headache, but a blood pressure is been gradually trending up. He was up when she was in pain and then down when she was out of pain and now is gradually creeping back up and she states this happens when she does not take her medication. She declines further intervention she is eager to go home and have her IV pulled. I've advised her to go home and have her blood pressure rechecked and take her blood pressure medication as usually scheduled. She does understand extensive return precautions.  FINAL CLINICAL IMPRESSION(S) / ED DIAGNOSES  Final diagnoses:  None     Schuyler Amor, MD 03/08/15 1779  Schuyler Amor, MD 03/08/15 1009  Schuyler Amor, MD 03/08/15 [place], MD 03/08/15 1409

## 2015-03-08 NOTE — ED Notes (Signed)
Pt has been seen by md.  Says she has nausea since Saturday, but worse on Sunday. Pt is tossing and turning on bed.  Using emesis bag, but no vomiting now.  Says upper abd hurts.  Iv started and meds given as ordered.  siderails up.

## 2015-03-08 NOTE — Discharge Instructions (Signed)

## 2015-03-08 NOTE — ED Notes (Signed)
Pt given ginger ale to drink. 

## 2015-03-08 NOTE — ED Notes (Signed)
Pt reports abdominal pain with nausea and vomiting for 2 days

## 2015-03-10 ENCOUNTER — Encounter: Payer: 59 | Admitting: Nurse Practitioner

## 2015-04-03 ENCOUNTER — Other Ambulatory Visit: Payer: Self-pay | Admitting: Nurse Practitioner

## 2015-04-04 NOTE — Telephone Encounter (Signed)
Please advise on refill, Last ov was 02/08/2015, prescribed at that visit with #30 , no refills. Thanks.

## 2015-04-07 ENCOUNTER — Encounter: Payer: Self-pay | Admitting: Nurse Practitioner

## 2015-04-07 ENCOUNTER — Ambulatory Visit (INDEPENDENT_AMBULATORY_CARE_PROVIDER_SITE_OTHER): Payer: BLUE CROSS/BLUE SHIELD | Admitting: Nurse Practitioner

## 2015-04-07 VITALS — BP 162/100 | HR 63 | Temp 98.1°F | Ht 64.0 in | Wt 206.4 lb

## 2015-04-07 DIAGNOSIS — J069 Acute upper respiratory infection, unspecified: Secondary | ICD-10-CM | POA: Diagnosis not present

## 2015-04-07 MED ORDER — AZITHROMYCIN 250 MG PO TABS: ORAL_TABLET | ORAL | Status: DC

## 2015-04-07 NOTE — Progress Notes (Signed)
[name], female    DOB: 09/15/1955  Age: 60 y.o. MRN: [id number]  CC: Acute Visit   HPI Jamie Arias presents for CC of URI symptoms x 6 days.   1) HTN- 162/100 forgot BP medications today  2) Chest congestion, itchy watery eyes, cough- non-productive, Rhinorrhea- clear Saturday started- 6 days of this HA  Sick contacts- Denies Treatment to date: Coricidin HBP Tylenol   History Jamie Arias has a past medical history of Diabetes mellitus without complication ([place]); Hypertension; Hyperlipidemia; Hypothyroidism; Diabetic neuropathy ([place]); Chronic systolic heart failure ([place]); Anemia; Coronary artery disease; Myocardial infarction ([place]) (2015); CHF (congestive heart failure) ([place]); Chronic kidney disease; Pulmonary hypertension ([place]); and Pleural effusion (2015).   Jamie Arias has past surgical history that includes Eye surgery; Coronary artery bypass graft (N/A, 07/02/2013); Intraoprative transesophageal echocardiogram (N/A, 07/02/2013); Thoracentesis (Left, 2015); and Cholecystectomy (N/A, 12/09/2014).   Jamie Arias family history includes COPD in Jamie Arias mother; Cancer in Jamie Arias mother; Pulmonary embolism in Jamie Arias father.Jamie Arias reports that Jamie Arias has never smoked. Jamie Arias has never used smokeless tobacco. Jamie Arias reports that Jamie Arias does not drink alcohol or use illicit drugs.  Outpatient Prescriptions Prior to Visit  Medication Sig Dispense Refill  . aspirin EC 81 MG tablet Take 1 tablet (81 mg total) by mouth daily. 90 tablet 3  . atorvastatin (LIPITOR) 80 MG tablet TAKE ONE TABLET BY MOUTH ONCE DAILY (Patient taking differently: TAKE ONE TABLET BY MOUTH ONCE DAILY IN THE AFTERNOON) 90 tablet 3  . blood glucose meter kit and supplies KIT Dispense based on patient and insurance preference. Use up to four times daily as directed. (FOR ICD-9 250.00, 250.01). 1 each 0  . carvedilol (COREG) 6.25 MG tablet TAKE ONE TABLET BY MOUTH TWICE DAILY 180 tablet 3  . furosemide (LASIX) 20 MG tablet Take 1 tablet (20 mg total) by  mouth daily. (Patient taking differently: Take 10 mg by mouth daily. ) 90 tablet 3  . gabapentin (NEURONTIN) 300 MG capsule TAKE ONE CAPSULE BY MOUTH THREE TIMES DAILY 90 capsule 0  . glucose blood test strip Test 4 times daily Dx. E11.9. One Touch ultra strips 100 each 12  . HYDROcodone-acetaminophen (NORCO) 5-325 MG tablet Take 1-2 tablets by mouth every 4 (four) hours as needed for moderate pain or severe pain. 35 tablet 0  . Insulin Lispro Prot & Lispro (HUMALOG MIX 75/25 KWIKPEN) (75-25) 100 UNIT/ML Kwikpen Inject 0.08 mLs (8 Units total) into the skin 2 (two) times daily. 15 mL 3  . Insulin Pen Needle 32G X 6 MM MISC Use with Humalog Kwikpen. 50 each 6  . Insulin Syringe-Needle U-100 (INSULIN SYRINGE .5CC/30GX1/2") 30G X 1/2" 0.5 ML MISC Use as directed 100 each 5  . levothyroxine (SYNTHROID, LEVOTHROID) 50 MCG tablet TAKE ONE TABLET BY MOUTH ONCE DAILY 30 tablet 11  . lisinopril (PRINIVIL,ZESTRIL) 20 MG tablet TAKE ONE TABLET BY MOUTH ONCE DAILY 30 tablet 2  . metFORMIN (GLUCOPHAGE) 500 MG tablet Take 1 tablet (500 mg total) by mouth 2 (two) times daily with a meal. 60 tablet 0  . nitroGLYCERIN (NITROSTAT) 0.4 MG SL tablet Place 1 tablet (0.4 mg total) under the tongue every 5 (five) minutes as needed for chest pain. 25 tablet 3  . ondansetron (ZOFRAN) 4 MG tablet Take 1 tablet (4 mg total) by mouth daily as needed for nausea or vomiting. 10 tablet 0  . polyethylene glycol (MIRALAX / GLYCOLAX) packet Take 17 g by mouth daily. 30 each 0  . promethazine (PHENERGAN) 12.5  MG tablet TAKE ONE TABLET BY MOUTH EVERY 6 HOURS AS NEEDED FOR NAUSEA AND VOMITING 30 tablet 0  . scopolamine (TRANSDERM-SCOP, 1.5 MG,) 1 MG/3DAYS Place 1 patch (1.5 mg total) onto the skin every 3 (three) days. 10 patch 0  . traZODone (DESYREL) 50 MG tablet TAKE ONE TABLET BY MOUTH AT BEDTIME AS NEEDED FOR SLEEP 30 tablet 0   No facility-administered medications prior to visit.    ROS Review of Systems  Constitutional:  Negative for fever, chills, diaphoresis and fatigue.  HENT: Positive for congestion, postnasal drip, rhinorrhea and sinus pressure. Negative for sneezing and sore throat.   Eyes: Positive for discharge and itching. Negative for photophobia, pain, redness and visual disturbance.       Watery  Respiratory: Positive for cough. Negative for chest tightness, shortness of breath and wheezing.   Cardiovascular: Negative for chest pain, palpitations and leg swelling.  Gastrointestinal: Negative for nausea, vomiting and diarrhea.  Skin: Negative for rash.  Neurological: Positive for headaches. Negative for dizziness, weakness and numbness.  Psychiatric/Behavioral: The patient is not nervous/anxious.     Objective:  BP 162/100 mmHg  Pulse 63  Temp(Src) 98.1 F (36.7 C) (Oral)  Ht 5' 4" (1.626 m)  Wt 206 lb 6 oz (93.611 kg)  BMI 35.41 kg/m2  SpO2 98%  Physical Exam  Constitutional: Jamie Arias is oriented to person, place, and time. Jamie Arias appears well-developed and well-nourished. No distress.  HENT:  Head: Normocephalic and atraumatic.  Right Ear: External ear normal.  Left Ear: External ear normal.  Mouth/Throat: No oropharyngeal exudate.  TM's clear bilaterally  Eyes: EOM are normal. Pupils are equal, round, and reactive to light. Right eye exhibits no discharge. Left eye exhibits no discharge. No scleral icterus.  Neck: Normal range of motion. Neck supple.  Cardiovascular: Normal rate and regular rhythm.   Pulmonary/Chest: Effort normal and breath sounds normal. No respiratory distress. Jamie Arias has no wheezes. Jamie Arias has no rales. Jamie Arias exhibits no tenderness.  Lymphadenopathy:    Jamie Arias has no cervical adenopathy.  Neurological: Jamie Arias is alert and oriented to person, place, and time. No cranial nerve deficit. Jamie Arias exhibits normal muscle tone. Coordination normal.  Skin: Skin is warm and dry. No rash noted. Jamie Arias is not diaphoretic.  Psychiatric: Jamie Arias has a normal mood and affect. Jamie Arias behavior is normal. Judgment  and thought content normal.      Assessment & Plan:   There are no diagnoses linked to this encounter. I am having Jamie Arias start on azithromycin. I am also having Jamie Arias maintain Jamie Arias nitroGLYCERIN, INSULIN SYRINGE .5CC/30GX1/2", furosemide, blood glucose meter kit and supplies, glucose blood, Insulin Lispro Prot & Lispro, Insulin Pen Needle, levothyroxine, scopolamine, aspirin EC, HYDROcodone-acetaminophen, polyethylene glycol, carvedilol, lisinopril, atorvastatin, metFORMIN, gabapentin, traZODone, ondansetron, and promethazine.  Meds ordered this encounter  Medications  . azithromycin (ZITHROMAX) 250 MG tablet    Sig: Take 2 tablets by mouth on day 1, take 1 tablet by mouth each day after for 4 days.    Dispense:  6 each    Refill:  0    Order Specific Question:  Supervising Provider    Answer:  Crecencio Mc [[id number]]     Follow-up: Return if symptoms worsen or fail to improve.

## 2015-04-07 NOTE — Patient Instructions (Signed)
NyQuil at night - generic okay  Cold and flu version with acetaminophen/Dextromethoraphan/Doxylamine   Z-pack as directed.

## 2015-04-07 NOTE — Progress Notes (Signed)
Pre visit review using our clinic review tool, if applicable. No additional management support is needed unless otherwise documented below in the visit note. 

## 2015-04-12 DIAGNOSIS — J069 Acute upper respiratory infection, unspecified: Secondary | ICD-10-CM | POA: Insufficient documentation

## 2015-04-12 NOTE — Assessment & Plan Note (Signed)
New Onset Due to length of symptoms with worsening will treat empirically  Z-pack was sent to the pharmacy Encouraged Probiotics Continue OTC measures - add Nyquil  FU prn worsening/failure to improve.

## 2015-04-21 ENCOUNTER — Other Ambulatory Visit: Payer: Self-pay | Admitting: Nurse Practitioner

## 2015-04-21 ENCOUNTER — Telehealth: Payer: Self-pay | Admitting: Nurse Practitioner

## 2015-04-21 MED ORDER — FLUTICASONE PROPIONATE 50 MCG/ACT NA SUSP: 2.0000 | Freq: Every day | NASAL | Status: DC

## 2015-04-21 NOTE — Telephone Encounter (Signed)
Spoke with patient's husband, they will try OTC meds, and a neti pot and the flonase.  Thanks

## 2015-04-21 NOTE — Telephone Encounter (Signed)
PAtient was seen on 2/2 and prescribed Zithromax.  Please advise, does she need an appt?

## 2015-04-21 NOTE — Telephone Encounter (Signed)
If she wants to try over the counter medications first- neti-pot or saline nasal spray, I can send in Flonase to her pharmacy to help with nasal congestion. Thanks!

## 2015-04-21 NOTE — Telephone Encounter (Signed)
Pt husband called about Still congested for a couple month/hard to breath at night/waking up is real bad congestion. Husband said it's really bad at night that he can hear the congestion when she's sleeping. Husband wants to know if something else can be called in? Pharmacy is [facility] [place], [place]. Call husband @ [phone]. Thank you!

## 2015-04-26 ENCOUNTER — Telehealth: Payer: Self-pay | Admitting: *Deleted

## 2015-04-26 NOTE — Telephone Encounter (Signed)
Patient has nausea, she would like a suggestion on foods that she can eat  With an upset stomach. She has nausea medication, however the medication is not working. She had her gallbladder taking out past September.  [phone]

## 2015-04-27 NOTE — Telephone Encounter (Signed)
Watch fatty, greasy foods, needs to drink water and focus on vegetables and fruits that are bland right now. This has been an ongoing problem and if something worsens she can always go to the ED. Imodium over the counter is good for diarrhea (more than 3 BMs daily that are loose/watery). If she has blood in stools- seek emergency care. Thanks!

## 2015-04-27 NOTE — Telephone Encounter (Signed)
Reason for call: Nausea, cant afford to go to GI because of not having insurance, pt is questioning what she can do until she can other resources to see GI Symptoms:nausea since Monday, diarrhea Duration: 2 days Medications: phenergan, pepto Last seen for this problem: 02/08/15 Seen by: Lorane Gell NP  Please advise, thanks

## 2015-04-27 NOTE — Telephone Encounter (Signed)
Notified pt. 

## 2015-04-28 ENCOUNTER — Ambulatory Visit: Payer: Self-pay | Admitting: Family Medicine

## 2015-04-28 ENCOUNTER — Other Ambulatory Visit: Payer: Self-pay | Admitting: Nurse Practitioner

## 2015-04-28 ENCOUNTER — Other Ambulatory Visit: Payer: Self-pay | Admitting: Cardiovascular Disease

## 2015-05-05 ENCOUNTER — Other Ambulatory Visit: Payer: Self-pay | Admitting: Nurse Practitioner

## 2015-05-13 ENCOUNTER — Telehealth: Payer: Self-pay | Admitting: Nurse Practitioner

## 2015-05-13 NOTE — Telephone Encounter (Signed)
Pt husband called stating pt needs to get her A1C lab work. Need orders please and thank you! Call husband @ [phone]. Thank you!

## 2015-05-13 NOTE — Telephone Encounter (Signed)
Last A1C was 02/08/15.Please advise, thanks

## 2015-05-16 ENCOUNTER — Telehealth: Payer: Self-pay | Admitting: Nurse Practitioner

## 2015-05-16 NOTE — Telephone Encounter (Signed)
Patient needs to make an appointment for a physical to have labs drawn at that visit. Thank you. Nothing to eat or drink after midnight.

## 2015-05-16 NOTE — Telephone Encounter (Signed)
Can you please schedule a physical and labs for this patient. thanks

## 2015-05-16 NOTE — Telephone Encounter (Signed)
Spoke with husband, She is congested and she bought Equate brand cold and multisymptom it has  Acetaminophen, Dextromethorthan and phenyleprine in it.  Is this okay for her her to take with her current meds.  She is not having a fever, has a productive sounding cough, no actual sputum present.  Pharmacist suggested flonase.  Please advise for other OTC options. Thanks

## 2015-05-16 NOTE — Telephone Encounter (Signed)
Ok. I called pt and left a vm. Husband called back stating he schedules appts for her due to her work schedule. Husband states he needed to speak to her about what time in the am is good for her.

## 2015-05-16 NOTE — Telephone Encounter (Signed)
Pt husband states pt brought some cold medication OTC and wanted to know if it's ok for her to take it being that she has High blood pressure and a diabetic? Call husband @ [phone]. Thank you!

## 2015-05-16 NOTE — Telephone Encounter (Signed)
Appointment on hold for her on this Thursday, pending husband checking his schedule. Thanks

## 2015-05-17 NOTE — Telephone Encounter (Signed)
The phenylephrine will likely elevate her blood pressure- Just make sure to watch it and don't take it often. I can send in an inhaler for the cough, if she is willing to try this. She pharmacy can show her how to use. Thanks!

## 2015-05-17 NOTE — Telephone Encounter (Signed)
Spoke with the husband , she will only take the medication when needed.  They picked up some flonase and they are going to see how that works. He will let us know if she needs anything.

## 2015-05-17 NOTE — Telephone Encounter (Signed)
Spoke with patient husband, they have scheduled a visit with Morey Hummingbird in April for follow up instead.

## 2015-05-18 ENCOUNTER — Other Ambulatory Visit: Payer: Self-pay

## 2015-05-18 NOTE — Telephone Encounter (Signed)
Pharmacy is requesting a refill on Zofran for this pt. Pt was given this med in the ED on 03/08/15. Please advise if okay to refill, thanks

## 2015-05-18 NOTE — Telephone Encounter (Signed)
Patient would like to know if she can have her blood work ordered and  drawn before her appt.  Please advise Pt Contact [phone]

## 2015-05-18 NOTE — Telephone Encounter (Signed)
Pt is requesting to have lab work done before CPE 4/17. Please advise, thanks

## 2015-05-19 ENCOUNTER — Other Ambulatory Visit: Payer: Self-pay | Admitting: Nurse Practitioner

## 2015-05-19 DIAGNOSIS — E785 Hyperlipidemia, unspecified: Secondary | ICD-10-CM

## 2015-05-19 DIAGNOSIS — I10 Essential (primary) hypertension: Secondary | ICD-10-CM

## 2015-05-19 DIAGNOSIS — I25709 Atherosclerosis of coronary artery bypass graft(s), unspecified, with unspecified angina pectoris: Secondary | ICD-10-CM

## 2015-05-19 MED ORDER — ONDANSETRON HCL 4 MG PO TABS: 4.0000 mg | ORAL_TABLET | Freq: Every day | ORAL | Status: DC | PRN

## 2015-05-19 NOTE — Telephone Encounter (Signed)
Rx sent through e-scribe  

## 2015-05-19 NOTE — Addendum Note (Signed)
Addended by: Lurlean Nanny on: 05/19/2015 09:27 AM   Modules accepted: Orders

## 2015-05-19 NOTE — Telephone Encounter (Signed)
Please schedule fasting lab appointment. She can come 3-4 days prior to her CPE. Thanks! (I put in labs)

## 2015-05-19 NOTE — Telephone Encounter (Signed)
[id number]

## 2015-05-20 NOTE — Telephone Encounter (Signed)
Labs scheduled  

## 2015-05-24 ENCOUNTER — Ambulatory Visit (INDEPENDENT_AMBULATORY_CARE_PROVIDER_SITE_OTHER): Payer: BLUE CROSS/BLUE SHIELD | Admitting: Family Medicine

## 2015-05-24 ENCOUNTER — Encounter: Payer: Self-pay | Admitting: Family Medicine

## 2015-05-24 VITALS — BP 122/82 | HR 72 | Temp 97.9°F | Wt 223.0 lb

## 2015-05-24 DIAGNOSIS — R05 Cough: Secondary | ICD-10-CM | POA: Diagnosis not present

## 2015-05-24 DIAGNOSIS — R059 Cough, unspecified: Secondary | ICD-10-CM

## 2015-05-24 MED ORDER — HYDROCOD POLST-CPM POLST ER 10-8 MG/5ML PO SUER: 5.0000 mL | Freq: Two times a day (BID) | ORAL | Status: DC | PRN

## 2015-05-24 NOTE — Progress Notes (Signed)
Pre visit review using our clinic review tool, if applicable. No additional management support is needed unless otherwise documented below in the visit note. 

## 2015-05-24 NOTE — Patient Instructions (Signed)
Use the cough medication as needed.  We will call with the xray results.  Follow up closely with Morey Hummingbird. You need to address the kidney disease, diabetes and anemia.  Take care  Dr. Lacinda Axon

## 2015-05-25 DIAGNOSIS — R059 Cough, unspecified: Secondary | ICD-10-CM | POA: Insufficient documentation

## 2015-05-25 DIAGNOSIS — R05 Cough: Secondary | ICD-10-CM | POA: Insufficient documentation

## 2015-05-25 NOTE — Assessment & Plan Note (Signed)
New problem. Likely acute bronchitis. Patient has had no improvement given her comorbidities, I'm obtaining a chest x-ray. Treating cough with Tussionex. Follow up as needed.

## 2015-05-25 NOTE — Progress Notes (Signed)
   Subjective:  [name], female    DOB: 05-Dec-1955  Age: 60 y.o. MRN: [id number]  CC: Cough, feeling poorly  HPI:  60 year old female with a complicated past medical history including CAD, hypertension, hyperlipidemia, DM 2 presents with the above complaints.  Patient reports that she has recently been feeling poorly. She been experiencing cough and associated nausea. No associated fever or chills. She been taking Mucinex, Coricidin, NyQuil, Flonase with no resolution in her symptoms. She is concerned given the fact that she has not improved. No known exacerbating factors.  Social Hx   Social History   Social History  . Marital Status: Married    Spouse Name: N/A  . Number of Children: N/A  . Years of Education: N/A   Social History Main Topics  . Smoking status: Never Smoker   . Smokeless tobacco: Never Used  . Alcohol Use: No  . Drug Use: No  . Sexual Activity: Not Asked   Other Topics Concern  . None   Social History Narrative   Patient lives at home with her husband. Patient has 4 adult children.   Review of Systems  Constitutional: Negative.   Respiratory: Positive for cough.   Gastrointestinal: Positive for nausea.   Objective:  BP 122/82 mmHg  Pulse 72  Temp(Src) 97.9 F (36.6 C) (Oral)  Wt 223 lb (101.152 kg)  SpO2 92%  BP/Weight 05/24/2015 [id number]  Systolic BP 123XX123 0000000 [id number]  Diastolic BP 82 123XX123 67  Wt. (Lbs) 223 206.38 210  BMI 38.26 35.41 36.03   Physical Exam  Constitutional: She is oriented to person, place, and time.  Chronically ill appearing. No acute distress.  HENT:  Mouth/Throat: Oropharynx is clear and moist.  Cardiovascular: Normal rate and regular rhythm.   Pulmonary/Chest: Effort normal and breath sounds normal.  Neurological: She is alert and oriented to person, place, and time.  Psychiatric: She has a normal mood and affect.  Vitals reviewed.  Lab Results  Component Value Date   WBC 9.0 03/08/2015   HGB  9.9* 03/08/2015   HCT 29.2* 03/08/2015   PLT 237 03/08/2015   GLUCOSE 200* 03/08/2015   CHOL 182 04/13/2014   TRIG 93 04/13/2014   HDL 70 04/13/2014   LDLDIRECT 126.9 10/31/2012   LDLCALC 93 04/13/2014   ALT 17 03/08/2015   AST 21 03/08/2015   NA 141 03/08/2015   K 3.9 03/08/2015   CL 108 03/08/2015   CREATININE 1.74* 03/08/2015   BUN 25* 03/08/2015   CO2 24 03/08/2015   TSH 2.26 06/17/2014   INR 1.19 07/13/2013   HGBA1C 9.4* 04/30/2014   MICROALBUR 163.6* 04/30/2014    Assessment & Plan:   Problem List Items Addressed This Visit    Cough - Primary    New problem. Likely acute bronchitis. Patient has had no improvement given her comorbidities, I'm obtaining a chest x-ray. Treating cough with Tussionex. Follow up as needed.      Relevant Orders   DG Chest 2 View      Meds ordered this encounter  Medications  . chlorpheniramine-HYDROcodone (TUSSIONEX PENNKINETIC ER) 10-8 MG/5ML SUER    Sig: Take 5 mLs by mouth every 12 (twelve) hours as needed.    Dispense:  115 mL    Refill:  0    Follow-up: PRN  [place]

## 2015-05-30 ENCOUNTER — Telehealth: Payer: Self-pay | Admitting: Nurse Practitioner

## 2015-05-30 NOTE — Telephone Encounter (Signed)
Thank you. Okay to stop

## 2015-05-30 NOTE — Telephone Encounter (Signed)
Patient's husband would like to know if there's  a medication that she can take over counter or prescribed.  Contact [phone]

## 2015-05-30 NOTE — Telephone Encounter (Signed)
Notified pt's husband of Lorane Gell, NP comments

## 2015-05-30 NOTE — Telephone Encounter (Signed)
Delsym or Robitussin over the counter

## 2015-05-30 NOTE — Telephone Encounter (Signed)
FYI: Pt states that she stop taking the cough syrup b/c is was dropping her sugars too low. Please advise, thanks

## 2015-05-30 NOTE — Telephone Encounter (Signed)
Notified pt. 

## 2015-05-30 NOTE — Telephone Encounter (Signed)
Pt wants to know if pt can take another cough medicine in place of the one that dropped her sugars too low. Please advise, thanks

## 2015-05-30 NOTE — Telephone Encounter (Signed)
Pt husband called about medication that was given last week for Bronchitis. Medication is chlorpheniramine-HYDROcodone (TUSSIONEX PENNKINETIC ER) 10-8 MG/5ML SUER. Husband states it's dropping her sugar level down to 42. Pt is no longer taking it. Call pt @ [phone]. Thank you!

## 2015-06-06 ENCOUNTER — Other Ambulatory Visit: Payer: Self-pay | Admitting: Nurse Practitioner

## 2015-06-06 ENCOUNTER — Other Ambulatory Visit: Payer: BLUE CROSS/BLUE SHIELD

## 2015-06-06 NOTE — Telephone Encounter (Signed)
Trazodone medication was refilled in 02/2015 and last seen in 02/2015.  Metformin has no recent A1C. please advise?

## 2015-06-10 ENCOUNTER — Encounter: Payer: Self-pay | Admitting: Nurse Practitioner

## 2015-06-16 ENCOUNTER — Other Ambulatory Visit: Payer: Self-pay | Admitting: Nurse Practitioner

## 2015-06-16 NOTE — Telephone Encounter (Signed)
Rx refill sent to pharmacy. 

## 2015-06-20 ENCOUNTER — Other Ambulatory Visit: Payer: BLUE CROSS/BLUE SHIELD

## 2015-06-20 ENCOUNTER — Telehealth: Payer: Self-pay | Admitting: Nurse Practitioner

## 2015-06-20 ENCOUNTER — Other Ambulatory Visit: Payer: Self-pay | Admitting: Nurse Practitioner

## 2015-06-20 MED ORDER — "INSULIN SYRINGE 30G X 1/2"" 0.5 ML MISC": Status: DC

## 2015-06-20 MED ORDER — INSULIN ASPART PROT & ASPART (70-30 MIX) 100 UNIT/ML PEN: 8.0000 [IU] | PEN_INJECTOR | Freq: Two times a day (BID) | SUBCUTANEOUS | Status: DC

## 2015-06-20 MED ORDER — INSULIN LISPRO PROT & LISPRO (75-25 MIX) 100 UNIT/ML KWIKPEN: PEN_INJECTOR | SUBCUTANEOUS | Status: DC

## 2015-06-20 MED ORDER — INSULIN PEN NEEDLE 32G X 6 MM MISC: Status: DC

## 2015-06-20 NOTE — Telephone Encounter (Signed)
Jamie Arias has called pt and made her aware about switch from Humalog 75/25 to [employer] 70/30 same units daily

## 2015-06-20 NOTE — Telephone Encounter (Signed)
Pt's insurance is declining Humalog this year and wants her to use Novalog instead.

## 2015-06-20 NOTE — Telephone Encounter (Signed)
Refilled per patient request. Thanks

## 2015-06-20 NOTE — Telephone Encounter (Signed)
Pt husband called about needing a refill for Insulin Lispro Prot & Lispro (HUMALOG MIX 75/25 KWIKPEN) (75-25) 100 UNIT/ML Kwikpen. Pt has ran out pt does not have anymore insulin at home. Pharmacy is [facility], [place]. Call pt husband @ [phone]. Thank you!

## 2015-06-21 ENCOUNTER — Telehealth: Payer: Self-pay | Admitting: Cardiovascular Disease

## 2015-06-21 NOTE — Telephone Encounter (Signed)
lmov for patient to let him know we need to resch apt, due to change in schedule

## 2015-06-24 ENCOUNTER — Other Ambulatory Visit (INDEPENDENT_AMBULATORY_CARE_PROVIDER_SITE_OTHER): Payer: BLUE CROSS/BLUE SHIELD

## 2015-06-24 DIAGNOSIS — I25709 Atherosclerosis of coronary artery bypass graft(s), unspecified, with unspecified angina pectoris: Secondary | ICD-10-CM | POA: Diagnosis not present

## 2015-06-24 DIAGNOSIS — I10 Essential (primary) hypertension: Secondary | ICD-10-CM | POA: Diagnosis not present

## 2015-06-24 DIAGNOSIS — E785 Hyperlipidemia, unspecified: Secondary | ICD-10-CM

## 2015-06-24 LAB — CBC WITH DIFFERENTIAL/PLATELET
Basophils Absolute: 0.1 10*3/uL (ref 0.0–0.1)
Basophils Relative: 0.8 % (ref 0.0–3.0)
Eosinophils Absolute: 0.4 10*3/uL (ref 0.0–0.7)
Eosinophils Relative: 5.3 % — ABNORMAL HIGH (ref 0.0–5.0)
HCT: 25.6 % — ABNORMAL LOW (ref 36.0–46.0)
Hemoglobin: 8.5 g/dL — ABNORMAL LOW (ref 12.0–15.0)
Lymphocytes Relative: 17.9 % (ref 12.0–46.0)
Lymphs Abs: 1.3 10*3/uL (ref 0.7–4.0)
MCHC: 33.2 g/dL (ref 30.0–36.0)
MCV: 85.9 fl (ref 78.0–100.0)
Monocytes Absolute: 0.4 10*3/uL (ref 0.1–1.0)
Monocytes Relative: 5.7 % (ref 3.0–12.0)
Neutro Abs: 5.2 10*3/uL (ref 1.4–7.7)
Neutrophils Relative %: 70.3 % (ref 43.0–77.0)
Platelets: 212 10*3/uL (ref 150.0–400.0)
RBC: 2.98 Mil/uL — ABNORMAL LOW (ref 3.87–5.11)
RDW: 16.2 % — ABNORMAL HIGH (ref 11.5–15.5)
WBC: 7.3 10*3/uL (ref 4.0–10.5)

## 2015-06-24 LAB — HEMOGLOBIN A1C: Hgb A1c MFr Bld: 7.4 % — ABNORMAL HIGH (ref 4.6–6.5)

## 2015-06-24 LAB — COMPREHENSIVE METABOLIC PANEL
ALT: 13 U/L (ref 0–35)
AST: 15 U/L (ref 0–37)
Albumin: 2.6 g/dL — ABNORMAL LOW (ref 3.5–5.2)
Alkaline Phosphatase: 64 U/L (ref 39–117)
BUN: 37 mg/dL — ABNORMAL HIGH (ref 6–23)
CO2: 24 mEq/L (ref 19–32)
Calcium: 8 mg/dL — ABNORMAL LOW (ref 8.4–10.5)
Chloride: 113 mEq/L — ABNORMAL HIGH (ref 96–112)
Creatinine, Ser: 2.65 mg/dL — ABNORMAL HIGH (ref 0.40–1.20)
GFR: 19.42 mL/min — ABNORMAL LOW (ref >60.00)
Glucose, Bld: 112 mg/dL — ABNORMAL HIGH (ref 70–99)
Potassium: 5 mEq/L (ref 3.5–5.1)
Sodium: 143 mEq/L (ref 135–145)
Total Bilirubin: 0.2 mg/dL (ref 0.2–1.2)
Total Protein: 5.7 g/dL — ABNORMAL LOW (ref 6.0–8.3)

## 2015-06-24 LAB — LIPID PANEL
Cholesterol: 194 mg/dL (ref 0–200)
HDL: 64.2 mg/dL (ref >39.00)
LDL Cholesterol: 114 mg/dL — ABNORMAL HIGH (ref 0–99)
NonHDL: 129.5
Total CHOL/HDL Ratio: 3
Triglycerides: 77 mg/dL (ref 0.0–149.0)
VLDL: 15.4 mg/dL (ref 0.0–40.0)

## 2015-06-24 LAB — TSH: TSH: 5.95 u[IU]/mL — ABNORMAL HIGH (ref 0.35–4.50)

## 2015-06-27 ENCOUNTER — Other Ambulatory Visit: Payer: BLUE CROSS/BLUE SHIELD

## 2015-07-01 ENCOUNTER — Ambulatory Visit (INDEPENDENT_AMBULATORY_CARE_PROVIDER_SITE_OTHER): Payer: BLUE CROSS/BLUE SHIELD | Admitting: Nurse Practitioner

## 2015-07-01 ENCOUNTER — Encounter: Payer: Self-pay | Admitting: Nurse Practitioner

## 2015-07-01 ENCOUNTER — Other Ambulatory Visit (HOSPITAL_COMMUNITY)
Admission: RE | Admit: 2015-07-01 | Discharge: 2015-07-01 | Disposition: A | Discharge status: Home / Self Care | Payer: BLUE CROSS/BLUE SHIELD | Source: Ambulatory Visit | Attending: Nurse Practitioner | Admitting: Nurse Practitioner

## 2015-07-01 VITALS — BP 132/80 | HR 90 | Ht 64.0 in | Wt 227.0 lb

## 2015-07-01 DIAGNOSIS — G479 Sleep disorder, unspecified: Secondary | ICD-10-CM | POA: Diagnosis not present

## 2015-07-01 DIAGNOSIS — N189 Chronic kidney disease, unspecified: Secondary | ICD-10-CM | POA: Diagnosis not present

## 2015-07-01 DIAGNOSIS — E039 Hypothyroidism, unspecified: Secondary | ICD-10-CM | POA: Diagnosis not present

## 2015-07-01 DIAGNOSIS — Z0001 Encounter for general adult medical examination with abnormal findings: Secondary | ICD-10-CM

## 2015-07-01 DIAGNOSIS — Z0000 Encounter for general adult medical examination without abnormal findings: Secondary | ICD-10-CM

## 2015-07-01 DIAGNOSIS — G629 Polyneuropathy, unspecified: Secondary | ICD-10-CM

## 2015-07-01 DIAGNOSIS — Z1151 Encounter for screening for human papillomavirus (HPV): Secondary | ICD-10-CM | POA: Diagnosis present

## 2015-07-01 DIAGNOSIS — Z01419 Encounter for gynecological examination (general) (routine) without abnormal findings: Secondary | ICD-10-CM

## 2015-07-01 DIAGNOSIS — N184 Chronic kidney disease, stage 4 (severe): Secondary | ICD-10-CM

## 2015-07-01 DIAGNOSIS — D631 Anemia in chronic kidney disease: Secondary | ICD-10-CM

## 2015-07-01 MED ORDER — LEVOTHYROXINE SODIUM 75 MCG PO TABS: 75.0000 ug | ORAL_TABLET | Freq: Every day | ORAL | Status: DC

## 2015-07-01 MED ORDER — BLOOD GLUCOSE MONITOR KIT: PACK | Status: DC

## 2015-07-01 NOTE — Patient Instructions (Addendum)
Cut down by 1 capsule of gabapentin every 7 days until done with it (3 weeks).   Try the full pill of Trazodone   We will call you about your kidney doctor referral.   Try to work on diet and exercise- look at hand out   The thyroid medication was upped to 75 mcg from 50 mcg.   Complete the cologuard, please.   [email or web address] for compression hose- start with 10-15 mm hg of pressure (low pressure stockings)   Menopause is a normal process in which your reproductive ability comes to an end. This process happens gradually over a span of months to years, usually between the ages of 32 and 74. Menopause is complete when you have missed 12 consecutive menstrual periods. It is important to talk with your health care provider about some of the most common conditions that affect postmenopausal women, such as heart disease, cancer, and bone loss (osteoporosis). Adopting a healthy lifestyle and getting preventive care can help to promote your health and wellness. Those actions can also lower your chances of developing some of these common conditions. WHAT SHOULD I KNOW ABOUT MENOPAUSE? During menopause, you may experience a number of symptoms, such as:  Moderate-to-severe hot flashes.  Night sweats.  Decrease in sex drive.  Mood swings.  Headaches.  Tiredness.  Irritability.  Memory problems.  Insomnia. Choosing to treat or not to treat menopausal changes is an individual decision that you make with your health care provider. WHAT SHOULD I KNOW ABOUT HORMONE REPLACEMENT THERAPY AND SUPPLEMENTS? Hormone therapy products are effective for treating symptoms that are associated with menopause, such as hot flashes and night sweats. Hormone replacement carries certain risks, especially as you become older. If you are thinking about using estrogen or estrogen with progestin treatments, discuss the benefits and risks with your health care provider. WHAT SHOULD I KNOW ABOUT HEART DISEASE AND  STROKE? Heart disease, heart attack, and stroke become more likely as you age. This may be due, in part, to the hormonal changes that your body experiences during menopause. These can affect how your body processes dietary fats, triglycerides, and cholesterol. Heart attack and stroke are both medical emergencies. There are many things that you can do to help prevent heart disease and stroke:  Have your blood pressure checked at least every 1-2 years. High blood pressure causes heart disease and increases the risk of stroke.  If you are 34-20 years old, ask your health care provider if you should take aspirin to prevent a heart attack or a stroke.  Do not use any tobacco products, including cigarettes, chewing tobacco, or electronic cigarettes. If you need help quitting, ask your health care provider.  It is important to eat a healthy diet and maintain a healthy weight.  Be sure to include plenty of vegetables, fruits, low-fat dairy products, and lean protein.  Avoid eating foods that are high in solid fats, added sugars, or salt (sodium).  Get regular exercise. This is one of the most important things that you can do for your health.  Try to exercise for at least 150 minutes each week. The type of exercise that you do should increase your heart rate and make you sweat. This is known as moderate-intensity exercise.  Try to do strengthening exercises at least twice each week. Do these in addition to the moderate-intensity exercise.  Know your numbers.Ask your health care provider to check your cholesterol and your blood glucose. Continue to have your blood tested as directed by  your health care provider. WHAT SHOULD I KNOW ABOUT CANCER SCREENING? There are several types of cancer. Take the following steps to reduce your risk and to catch any cancer development as early as possible. Breast Cancer  Practice breast self-awareness.  This means understanding how your breasts normally appear  and feel.  It also means doing regular breast self-exams. Let your health care provider know about any changes, no matter how small.  If you are 67 or older, have a clinician do a breast exam (clinical breast exam or CBE) every year. Depending on your age, family history, and medical history, it may be recommended that you also have a yearly breast X-ray (mammogram).  If you have a family history of breast cancer, talk with your health care provider about genetic screening.  If you are at high risk for breast cancer, talk with your health care provider about having an MRI and a mammogram every year.  Breast cancer (BRCA) gene test is recommended for women who have family members with BRCA-related cancers. Results of the assessment will determine the need for genetic counseling and BRCA1 and for BRCA2 testing. BRCA-related cancers include these types:  Breast. This occurs in males or females.  Ovarian.  Tubal. This may also be called fallopian tube cancer.  Cancer of the abdominal or pelvic lining (peritoneal cancer).  Prostate.  Pancreatic. Cervical, Uterine, and Ovarian Cancer Your health care provider may recommend that you be screened regularly for cancer of the pelvic organs. These include your ovaries, uterus, and vagina. This screening involves a pelvic exam, which includes checking for microscopic changes to the surface of your cervix (Pap test).  For women ages 21-65, health care providers may recommend a pelvic exam and a Pap test every three years. For women ages 60-65, they may recommend the Pap test and pelvic exam, combined with testing for human papilloma virus (HPV), every five years. Some types of HPV increase your risk of cervical cancer. Testing for HPV may also be done on women of any age who have unclear Pap test results.  Other health care providers may not recommend any screening for nonpregnant women who are considered low risk for pelvic cancer and have no  symptoms. Ask your health care provider if a screening pelvic exam is right for you.  If you have had past treatment for cervical cancer or a condition that could lead to cancer, you need Pap tests and screening for cancer for at least 20 years after your treatment. If Pap tests have been discontinued for you, your risk factors (such as having a new sexual partner) need to be reassessed to determine if you should start having screenings again. Some women have medical problems that increase the chance of getting cervical cancer. In these cases, your health care provider may recommend that you have screening and Pap tests more often.  If you have a family history of uterine cancer or ovarian cancer, talk with your health care provider about genetic screening.  If you have vaginal bleeding after reaching menopause, tell your health care provider.  There are currently no reliable tests available to screen for ovarian cancer. Lung Cancer Lung cancer screening is recommended for adults 17-58 years old who are at high risk for lung cancer because of a history of smoking. A yearly low-dose CT scan of the lungs is recommended if you:  Currently smoke.  Have a history of at least 30 pack-years of smoking and you currently smoke or have quit within  the past 15 years. A pack-year is smoking an average of one pack of cigarettes per day for one year. Yearly screening should:  Continue until it has been 15 years since you quit.  Stop if you develop a health problem that would prevent you from having lung cancer treatment. Colorectal Cancer  This type of cancer can be detected and can often be prevented.  Routine colorectal cancer screening usually begins at age 62 and continues through age 20.  If you have risk factors for colon cancer, your health care provider may recommend that you be screened at an earlier age.  If you have a family history of colorectal cancer, talk with your health care provider  about genetic screening.  Your health care provider may also recommend using home test kits to check for hidden blood in your stool.  A small camera at the end of a tube can be used to examine your colon directly (sigmoidoscopy or colonoscopy). This is done to check for the earliest forms of colorectal cancer.  Direct examination of the colon should be repeated every 5-10 years until age 62. However, if early forms of precancerous polyps or small growths are found or if you have a family history or genetic risk for colorectal cancer, you may need to be screened more often. Skin Cancer  Check your skin from head to toe regularly.  Monitor any moles. Be sure to tell your health care provider:  About any new moles or changes in moles, especially if there is a change in a mole's shape or color.  If you have a mole that is larger than the size of a pencil eraser.  If any of your family members has a history of skin cancer, especially at a young age, talk with your health care provider about genetic screening.  Always use sunscreen. Apply sunscreen liberally and repeatedly throughout the day.  Whenever you are outside, protect yourself by wearing long sleeves, pants, a wide-brimmed hat, and sunglasses. WHAT SHOULD I KNOW ABOUT OSTEOPOROSIS? Osteoporosis is a condition in which bone destruction happens more quickly than new bone creation. After menopause, you may be at an increased risk for osteoporosis. To help prevent osteoporosis or the bone fractures that can happen because of osteoporosis, the following is recommended:  If you are 4-20 years old, get at least 1,000 mg of calcium and at least 600 mg of vitamin D per day.  If you are older than age 23 but younger than age 82, get at least 1,200 mg of calcium and at least 600 mg of vitamin D per day.  If you are older than age 38, get at least 1,200 mg of calcium and at least 800 mg of vitamin D per day. Smoking and excessive alcohol intake  increase the risk of osteoporosis. Eat foods that are rich in calcium and vitamin D, and do weight-bearing exercises several times each week as directed by your health care provider. WHAT SHOULD I KNOW ABOUT HOW MENOPAUSE AFFECTS [place]? Depression may occur at any age, but it is more common as you become older. Common symptoms of depression include:  Low or sad mood.  Changes in sleep patterns.  Changes in appetite or eating patterns.  Feeling an overall lack of motivation or enjoyment of activities that you previously enjoyed.  Frequent crying spells. Talk with your health care provider if you think that you are experiencing depression. WHAT SHOULD I KNOW ABOUT IMMUNIZATIONS? It is important that you get and  maintain your immunizations. These include:  Tetanus, diphtheria, and pertussis (Tdap) booster vaccine.  Influenza every year before the flu season begins.  Pneumonia vaccine.  Shingles vaccine. Your health care provider may also recommend other immunizations.   This information is not intended to replace advice given to you by your health care provider. Make sure you discuss any questions you have with your health care provider.   Document Released: 04/13/2005 Document Revised: 03/12/2014 Document Reviewed: 10/22/2013 Elsevier Interactive Patient Education [employer].

## 2015-07-01 NOTE — Progress Notes (Signed)
[name], female    DOB: [birth year]  Age: 60 y.o. MRN: [id number]  CC: CPE   HPI Jamie Arias presents for Annual Exam w/ PAP and breast exam.   1) Annual Physical   Diet- Watching sugar in diet  Exercise- No formal   Immunizations- UTD  Mammogram- Wants to defer   Colonoscopy- Has Cologuard   Eye Exam- Vessels bleeding, going often   Dental Exam- No dental insurance  LMP- Post-menopausal   Labs- Had on 06/24/15   Fall- Neg.   Depression- Neg.  Refills: Meter  Taking 1 gabapentin at lunch and 2 at night - not helpful   History Jamie Arias has a past medical history of Diabetes mellitus without complication ([place]); Hypertension; Hyperlipidemia; Hypothyroidism; Diabetic neuropathy ([place]); Chronic systolic heart failure ([place]); Anemia; Coronary artery disease; Myocardial infarction ([place]) (2015); CHF (congestive heart failure) ([place]); Chronic kidney disease; Pulmonary hypertension ([place]); and Pleural effusion (2015).   She has past surgical history that includes Eye surgery; Coronary artery bypass graft (N/A, 07/02/2013); Intraoprative transesophageal echocardiogram (N/A, 07/02/2013); Thoracentesis (Left, 2015); and Cholecystectomy (N/A, 12/09/2014).   Her family history includes COPD in her mother; Cancer in her mother; Pulmonary embolism in her father.She reports that she has never smoked. She has never used smokeless tobacco. She reports that she does not drink alcohol or use illicit drugs.  Outpatient Prescriptions Prior to Visit  Medication Sig Dispense Refill  . aspirin EC 81 MG tablet Take 1 tablet (81 mg total) by mouth daily. 90 tablet 3  . atorvastatin (LIPITOR) 80 MG tablet TAKE ONE TABLET BY MOUTH ONCE DAILY (Patient taking differently: TAKE ONE TABLET BY MOUTH ONCE DAILY IN THE AFTERNOON) 90 tablet 3  . carvedilol (COREG) 6.25 MG tablet TAKE ONE TABLET BY MOUTH TWICE DAILY 180 tablet 3  . fluticasone (FLONASE) 50 MCG/ACT nasal spray Place 2 sprays into both nostrils  daily. 16 g 6  . furosemide (LASIX) 20 MG tablet Take 1 tablet (20 mg total) by mouth daily. (Patient taking differently: Take 10 mg by mouth daily. ) 90 tablet 3  . glucose blood test strip Test 4 times daily Dx. E11.9. One Touch ultra strips 100 each 12  . HYDROcodone-acetaminophen (NORCO) 5-325 MG tablet Take 1-2 tablets by mouth every 4 (four) hours as needed for moderate pain or severe pain. 35 tablet 0  . insulin aspart protamine - aspart (NOVOLOG 70/30 MIX) (70-30) 100 UNIT/ML FlexPen Inject 0.08 mLs (8 Units total) into the skin 2 (two) times daily. 15 mL 11  . Insulin Pen Needle 32G X 6 MM MISC Use with Humalog Kwikpen. 50 each 6  . Insulin Syringe-Needle U-100 (INSULIN SYRINGE .5CC/30GX1/2") 30G X 1/2" 0.5 ML MISC Use as directed 100 each 5  . lisinopril (PRINIVIL,ZESTRIL) 20 MG tablet TAKE ONE TABLET BY MOUTH ONCE DAILY 30 tablet 2  . metFORMIN (GLUCOPHAGE) 500 MG tablet TAKE ONE TABLET BY MOUTH TWICE DAILY WITH MEALS 60 tablet 0  . ondansetron (ZOFRAN) 4 MG tablet Take 1 tablet (4 mg total) by mouth daily as needed for nausea or vomiting. 10 tablet 0  . polyethylene glycol (MIRALAX / GLYCOLAX) packet Take 17 g by mouth daily. 30 each 0  . promethazine (PHENERGAN) 12.5 MG tablet TAKE ONE TABLET BY MOUTH EVERY 6 HOURS AS NEEDED FOR NAUSEA AND VOMITING 30 tablet 0  . traZODone (DESYREL) 50 MG tablet TAKE ONE TABLET BY MOUTH AT BEDTIME AS NEEDED FOR SLEEP 30 tablet 0  . azithromycin (ZITHROMAX) 250  MG tablet Take 2 tablets by mouth on day 1, take 1 tablet by mouth each day after for 4 days. 6 each 0  . blood glucose meter kit and supplies KIT Dispense based on patient and insurance preference. Use up to four times daily as directed. (FOR ICD-9 250.00, 250.01). 1 each 0  . chlorpheniramine-HYDROcodone (TUSSIONEX PENNKINETIC ER) 10-8 MG/5ML SUER Take 5 mLs by mouth every 12 (twelve) hours as needed. 115 mL 0  . furosemide (LASIX) 40 MG tablet TAKE ONE TABLET BY MOUTH ONCE DAILY 30 tablet 1  .  gabapentin (NEURONTIN) 300 MG capsule TAKE ONE CAPSULE BY MOUTH THREE TIMES DAILY 90 capsule 0  . levothyroxine (SYNTHROID, LEVOTHROID) 50 MCG tablet TAKE ONE TABLET BY MOUTH ONCE DAILY 30 tablet 11  . nitroGLYCERIN (NITROSTAT) 0.4 MG SL tablet Place 1 tablet (0.4 mg total) under the tongue every 5 (five) minutes as needed for chest pain. 25 tablet 3  . scopolamine (TRANSDERM-SCOP, 1.5 MG,) 1 MG/3DAYS Place 1 patch (1.5 mg total) onto the skin every 3 (three) days. 10 patch 0   No facility-administered medications prior to visit.    ROS Review of Systems  Constitutional: Negative for fever, chills, diaphoresis, fatigue and unexpected weight change.  HENT: Negative for tinnitus and trouble swallowing.   Eyes: Negative for visual disturbance.  Respiratory: Negative for chest tightness, shortness of breath and wheezing.   Cardiovascular: Negative for chest pain, palpitations and leg swelling.  Gastrointestinal: Negative for nausea, vomiting, abdominal pain, diarrhea, constipation and blood in stool.  Endocrine: Negative for polydipsia, polyphagia and polyuria.  Genitourinary: Negative for dysuria, hematuria, vaginal discharge and vaginal pain.  Musculoskeletal: Negative for myalgias, back pain, arthralgias and gait problem.  Skin: Negative for color change and rash.  Neurological: Negative for dizziness, weakness, numbness and headaches.  Hematological: Does not bruise/bleed easily.  Psychiatric/Behavioral: Negative for suicidal ideas and sleep disturbance. The patient is not nervous/anxious.     Objective:  BP 132/80 mmHg  Pulse 90  Ht '5\' 4"'$  (1.626 m)  Wt 227 lb (102.967 kg)  BMI 38.95 kg/m2  SpO2 97%  Physical Exam  Constitutional: She is oriented to person, place, and time. She appears well-developed and well-nourished. No distress.  HENT:  Head: Normocephalic and atraumatic.  Right Ear: External ear normal.  Left Ear: External ear normal.  Nose: Nose normal.  Mouth/Throat:  Oropharynx is clear and moist. No oropharyngeal exudate.  TMs and canals clear bilaterally  Eyes: Conjunctivae and EOM are normal. Pupils are equal, round, and reactive to light. Right eye exhibits no discharge. Left eye exhibits no discharge. No scleral icterus.  Neck: Normal range of motion. Neck supple. No thyromegaly present.  Cardiovascular: Normal rate, regular rhythm, normal heart sounds and intact distal pulses.  Exam reveals no gallop and no friction rub.   No murmur heard. Pulmonary/Chest: Effort normal and breath sounds normal. No respiratory distress. She has no wheezes. She has no rales. She exhibits no tenderness.  Abdominal: Soft. Bowel sounds are normal. She exhibits no distension and no mass. There is no tenderness. There is no rebound and no guarding.  Musculoskeletal: Normal range of motion. She exhibits no edema or tenderness.  Lymphadenopathy:    She has no cervical adenopathy.  Neurological: She is alert and oriented to person, place, and time. She has normal reflexes. No cranial nerve deficit. She exhibits normal muscle tone. Coordination normal.  Skin: Skin is warm and dry. No rash noted. She is not diaphoretic. No erythema. No  pallor.  Psychiatric: She has a normal mood and affect. Her behavior is normal. Judgment and thought content normal.   Assessment & Plan:   Doylene was seen today for cpe.  Diagnoses and all orders for this visit:  Encounter for routine gynecological examination -     Cytology - PAP  Routine general medical examination at a health care facility  Neuropathy [facility]) -     Ambulatory referral to Nephrology  Hypothyroidism, unspecified hypothyroidism type  Anemia in chronic renal disease -     Ambulatory referral to Nephrology  Sleep disorder  Kidney disease, chronic, stage IV (GFR 15-29 ml/min) ([place]) -     Ambulatory referral to Nephrology  Other orders -     blood glucose meter kit and supplies KIT; Dispense based on patient and  insurance preference. Use up to twice daily. (FOR ICD-10 E11.9) -     levothyroxine (SYNTHROID, LEVOTHROID) 75 MCG tablet; Take 1 tablet (75 mcg total) by mouth daily.   I have discontinued Ms. Colgate levothyroxine, azithromycin, chlorpheniramine-HYDROcodone, and gabapentin. I have also changed her blood glucose meter kit and supplies. Additionally, I am having her start on levothyroxine. Lastly, I am having her maintain her furosemide, glucose blood, aspirin EC, HYDROcodone-acetaminophen, polyethylene glycol, carvedilol, lisinopril, atorvastatin, promethazine, fluticasone, ondansetron, traZODone, metFORMIN, Insulin Pen Needle, INSULIN SYRINGE .5CC/30GX1/2", and insulin aspart protamine - aspart.  Meds ordered this encounter  Medications  . blood glucose meter kit and supplies KIT    Sig: Dispense based on patient and insurance preference. Use up to twice daily. (FOR ICD-10 E11.9)    Dispense:  1 each    Refill:  0    Order Specific Question:  Supervising Provider    Answer:  Crecencio Mc [[id number]]    Order Specific Question:  Number of strips    Answer:  100    Order Specific Question:  Number of lancets    Answer:  100  . levothyroxine (SYNTHROID, LEVOTHROID) 75 MCG tablet    Sig: Take 1 tablet (75 mcg total) by mouth daily.    Dispense:  90 tablet    Refill:  1    Order Specific Question:  Supervising Provider    Answer:  Crecencio Mc [[id number]]     Follow-up: Return in about 6 weeks (around 08/12/2015) for Follow up .

## 2015-07-04 LAB — CYTOLOGY - PAP

## 2015-07-06 ENCOUNTER — Other Ambulatory Visit: Payer: Self-pay | Admitting: Cardiovascular Disease

## 2015-07-06 ENCOUNTER — Encounter: Payer: Self-pay | Admitting: Nurse Practitioner

## 2015-07-06 ENCOUNTER — Ambulatory Visit (INDEPENDENT_AMBULATORY_CARE_PROVIDER_SITE_OTHER): Payer: BLUE CROSS/BLUE SHIELD | Admitting: Nurse Practitioner

## 2015-07-06 VITALS — BP 166/88 | HR 64 | Temp 98.3°F | Ht 64.0 in | Wt 221.6 lb

## 2015-07-06 DIAGNOSIS — Z0000 Encounter for general adult medical examination without abnormal findings: Secondary | ICD-10-CM | POA: Insufficient documentation

## 2015-07-06 DIAGNOSIS — R110 Nausea: Secondary | ICD-10-CM

## 2015-07-06 DIAGNOSIS — J0110 Acute frontal sinusitis, unspecified: Secondary | ICD-10-CM | POA: Diagnosis not present

## 2015-07-06 DIAGNOSIS — Z01419 Encounter for gynecological examination (general) (routine) without abnormal findings: Secondary | ICD-10-CM | POA: Insufficient documentation

## 2015-07-06 MED ORDER — DOXYCYCLINE HYCLATE 100 MG PO TABS: 100.0000 mg | ORAL_TABLET | Freq: Two times a day (BID) | ORAL | Status: DC

## 2015-07-06 NOTE — Assessment & Plan Note (Addendum)
Patient reports taking 3 gabapentin daily and not helpful Improved A1c Patient would like to wean off of We'll send to nephrology

## 2015-07-06 NOTE — Assessment & Plan Note (Signed)
Likely anemia of chronic kidney disease

## 2015-07-06 NOTE — Progress Notes (Signed)
[name], female    DOB: [birth year]  Age: 60 y.o. MRN: [id number]  CC: Sinusitis and Nausea   HPI SKAI LICKTEIG presents for CC of nausea and sinus concern   1) Sinusitis- HA x 2 days, poor appetite Swelling around eyes, pruritis, matting in the morning, clear drainage, sneezing   2) Nausea x 6 months and phenergan and zofran helpful  Still having epigastric pressure  Poor appetite    History Dasia has a past medical history of Diabetes mellitus without complication ([place]); Hypertension; Hyperlipidemia; Hypothyroidism; Diabetic neuropathy ([place]); Chronic systolic heart failure ([place]); Anemia; Coronary artery disease; Myocardial infarction ([place]) (2015); CHF (congestive heart failure) ([place]); Chronic kidney disease; Pulmonary hypertension ([place]); and Pleural effusion (2015).   She has past surgical history that includes Eye surgery; Coronary artery bypass graft (N/A, 07/02/2013); Intraoprative transesophageal echocardiogram (N/A, 07/02/2013); Thoracentesis (Left, 2015); and Cholecystectomy (N/A, 12/09/2014).   Her family history includes COPD in her mother; Cancer in her mother; Pulmonary embolism in her father.She reports that she has never smoked. She has never used smokeless tobacco. She reports that she does not drink alcohol or use illicit drugs.  Outpatient Prescriptions Prior to Visit  Medication Sig Dispense Refill  . aspirin EC 81 MG tablet Take 1 tablet (81 mg total) by mouth daily. 90 tablet 3  . atorvastatin (LIPITOR) 80 MG tablet TAKE ONE TABLET BY MOUTH ONCE DAILY (Patient taking differently: TAKE ONE TABLET BY MOUTH ONCE DAILY IN THE AFTERNOON) 90 tablet 3  . blood glucose meter kit and supplies KIT Dispense based on patient and insurance preference. Use up to twice daily. (FOR ICD-10 E11.9) 1 each 0  . carvedilol (COREG) 6.25 MG tablet TAKE ONE TABLET BY MOUTH TWICE DAILY 180 tablet 3  . fluticasone (FLONASE) 50 MCG/ACT nasal spray Place 2 sprays into both nostrils  daily. 16 g 6  . furosemide (LASIX) 20 MG tablet Take 1 tablet (20 mg total) by mouth daily. (Patient taking differently: Take 10 mg by mouth daily. ) 90 tablet 3  . glucose blood test strip Test 4 times daily Dx. E11.9. One Touch ultra strips 100 each 12  . HYDROcodone-acetaminophen (NORCO) 5-325 MG tablet Take 1-2 tablets by mouth every 4 (four) hours as needed for moderate pain or severe pain. 35 tablet 0  . insulin aspart protamine - aspart (NOVOLOG 70/30 MIX) (70-30) 100 UNIT/ML FlexPen Inject 0.08 mLs (8 Units total) into the skin 2 (two) times daily. 15 mL 11  . Insulin Pen Needle 32G X 6 MM MISC Use with Humalog Kwikpen. 50 each 6  . Insulin Syringe-Needle U-100 (INSULIN SYRINGE .5CC/30GX1/2") 30G X 1/2" 0.5 ML MISC Use as directed 100 each 5  . levothyroxine (SYNTHROID, LEVOTHROID) 75 MCG tablet Take 1 tablet (75 mcg total) by mouth daily. 90 tablet 1  . lisinopril (PRINIVIL,ZESTRIL) 20 MG tablet TAKE ONE TABLET BY MOUTH ONCE DAILY 30 tablet 2  . metFORMIN (GLUCOPHAGE) 500 MG tablet TAKE ONE TABLET BY MOUTH TWICE DAILY WITH MEALS 60 tablet 0  . ondansetron (ZOFRAN) 4 MG tablet Take 1 tablet (4 mg total) by mouth daily as needed for nausea or vomiting. 10 tablet 0  . polyethylene glycol (MIRALAX / GLYCOLAX) packet Take 17 g by mouth daily. 30 each 0  . promethazine (PHENERGAN) 12.5 MG tablet TAKE ONE TABLET BY MOUTH EVERY 6 HOURS AS NEEDED FOR NAUSEA AND VOMITING 30 tablet 0  . traZODone (DESYREL) 50 MG tablet TAKE ONE TABLET BY MOUTH AT BEDTIME  AS NEEDED FOR SLEEP 30 tablet 0  . nitroGLYCERIN (NITROSTAT) 0.4 MG SL tablet Place 1 tablet (0.4 mg total) under the tongue every 5 (five) minutes as needed for chest pain. 25 tablet 3  . furosemide (LASIX) 40 MG tablet TAKE ONE TABLET BY MOUTH ONCE DAILY 30 tablet 1  . scopolamine (TRANSDERM-SCOP, 1.5 MG,) 1 MG/3DAYS Place 1 patch (1.5 mg total) onto the skin every 3 (three) days. 10 patch 0   No facility-administered medications prior to visit.     ROS Review of Systems  Constitutional: Positive for chills, diaphoresis, appetite change and fatigue. Negative for fever.       Decreased appetite  HENT: Positive for congestion and facial swelling. Negative for ear discharge and ear pain.   Eyes: Positive for discharge and itching. Negative for photophobia, pain, redness and visual disturbance.  Respiratory: Negative for chest tightness, shortness of breath and wheezing.   Cardiovascular: Negative for chest pain, palpitations and leg swelling.  Gastrointestinal: Positive for nausea. Negative for vomiting and diarrhea.  Skin: Negative for rash.  Neurological: Positive for headaches. Negative for dizziness, weakness and numbness.  Psychiatric/Behavioral: The patient is not nervous/anxious.     Objective:  BP 166/88 mmHg  Pulse 64  Temp(Src) 98.3 F (36.8 C) (Oral)  Ht _0  (1.626 m)  Wt 221 lb 9.6 oz (100.517 kg)  BMI 38.02 kg/m2  SpO2 92%  Physical Exam  Constitutional: She is oriented to person, place, and time. She appears well-developed and well-nourished. No distress.  HENT:  Head: Normocephalic and atraumatic.  Right Ear: External ear normal.  Left Ear: External ear normal.  Mouth/Throat: Oropharynx is clear and moist. No oropharyngeal exudate.  TMs clear bilaterally Puffy eyes  Eyes: EOM are normal. Pupils are equal, round, and reactive to light. Right eye exhibits no discharge. Left eye exhibits no discharge. No scleral icterus.  Neck: Normal range of motion. Neck supple.  Cardiovascular: Normal rate and regular rhythm.   Pulmonary/Chest: Effort normal and breath sounds normal. No respiratory distress. She has no wheezes. She has no rales. She exhibits no tenderness.  Abdominal: Soft. Bowel sounds are normal. She exhibits no distension and no mass. There is no tenderness. There is no rebound and no guarding.  Obese  Lymphadenopathy:    She has no cervical adenopathy.  Neurological: She is alert and oriented to  person, place, and time.  Skin: Skin is warm and dry. No rash noted. She is not diaphoretic.  Psychiatric: She has a normal mood and affect. Her behavior is normal. Judgment and thought content normal.   Assessment & Plan:   Vonita was seen today for sinusitis and nausea.  Diagnoses and all orders for this visit:  Nausea without vomiting  Acute frontal sinusitis, recurrence not specified  Other orders -     doxycycline (VIBRA-TABS) 100 MG tablet; Take 1 tablet (100 mg total) by mouth 2 (two) times daily.  I have discontinued Ms. Loa scopolamine. I am also having her start on doxycycline. Additionally, I am having her maintain her furosemide, glucose blood, aspirin EC, HYDROcodone-acetaminophen, polyethylene glycol, carvedilol, lisinopril, atorvastatin, promethazine, fluticasone, ondansetron, traZODone, metFORMIN, Insulin Pen Needle, INSULIN SYRINGE .5CC/30GX1/2", insulin aspart protamine - aspart, blood glucose meter kit and supplies, and levothyroxine.  Meds ordered this encounter  Medications  . doxycycline (VIBRA-TABS) 100 MG tablet    Sig: Take 1 tablet (100 mg total) by mouth 2 (two) times daily.    Dispense:  14 tablet    Refill:  0    Order Specific Question:  Supervising Provider    Answer:  Crecencio Mc [[id number]]     Follow-up: Return if symptoms worsen or fail to improve.

## 2015-07-06 NOTE — Patient Instructions (Signed)
Doxycyline as directed   Benadryl at night and flonase any time   Probiotics and zofran/phenergan as needed.   Toast or crackers in the morning.  Call your eye doctor

## 2015-07-06 NOTE — Assessment & Plan Note (Signed)
Discussed acute and chronic issues. Reviewed health maintenance measures, PFSHx, and immunizations. Discussed labs. Patient is being referred to nephrology.  Pap and breast exam were done today Patient has cold guard at home and was encouraged to complete the process

## 2015-07-06 NOTE — Assessment & Plan Note (Signed)
TSH was slightly elevated Called and levothyroxine 75 g

## 2015-07-06 NOTE — Progress Notes (Signed)
Pre visit review using our clinic review tool, if applicable. No additional management support is needed unless otherwise documented below in the visit note. 

## 2015-07-06 NOTE — Assessment & Plan Note (Signed)
Asked patient to try a whole tablet of trazodone

## 2015-07-06 NOTE — Assessment & Plan Note (Signed)
Pap and breast exam were performed today without significant findings

## 2015-07-07 DIAGNOSIS — R110 Nausea: Secondary | ICD-10-CM | POA: Insufficient documentation

## 2015-07-07 DIAGNOSIS — J329 Chronic sinusitis, unspecified: Secondary | ICD-10-CM | POA: Insufficient documentation

## 2015-07-07 NOTE — Assessment & Plan Note (Addendum)
New onset Likely Allergies on top of sinusitis- green mucous production, nausea, facial swelling/pain ect.... Tx with Doxycycline sent to pharmacy 1/2 tablet of benadryl at night FU prn worsening/failure to improve.

## 2015-07-07 NOTE — Assessment & Plan Note (Signed)
Chronic Pt declines further work up at this time Poor diet, but concerned for ulcer or other pathology r/t s/p cholecystectomy.  Continue Zofran Finish doxycyline Probiotics recommended

## 2015-07-08 ENCOUNTER — Ambulatory Visit: Payer: Self-pay | Admitting: Cardiovascular Disease

## 2015-07-09 ENCOUNTER — Other Ambulatory Visit: Payer: Self-pay | Admitting: Nurse Practitioner

## 2015-07-11 NOTE — Telephone Encounter (Signed)
Refilled on 05/19/15. Last seen 07/06/15.  Please advise?

## 2015-07-12 ENCOUNTER — Other Ambulatory Visit: Payer: Self-pay | Admitting: Nurse Practitioner

## 2015-07-22 ENCOUNTER — Encounter: Payer: Self-pay | Admitting: Cardiovascular Disease

## 2015-07-22 ENCOUNTER — Ambulatory Visit (INDEPENDENT_AMBULATORY_CARE_PROVIDER_SITE_OTHER): Payer: BLUE CROSS/BLUE SHIELD | Admitting: Cardiovascular Disease

## 2015-07-22 ENCOUNTER — Encounter: Payer: Self-pay | Admitting: Emergency Medicine

## 2015-07-22 ENCOUNTER — Emergency Department
Admission: EM | Admit: 2015-07-22 | Discharge: 2015-07-23 | Disposition: A | Discharge status: Home / Self Care | Payer: BLUE CROSS/BLUE SHIELD | Attending: Emergency Medicine | Admitting: Emergency Medicine

## 2015-07-22 ENCOUNTER — Emergency Department: Payer: BLUE CROSS/BLUE SHIELD

## 2015-07-22 VITALS — BP 150/90 | HR 64 | Ht 64.0 in | Wt 214.8 lb

## 2015-07-22 DIAGNOSIS — Z79899 Other long term (current) drug therapy: Secondary | ICD-10-CM | POA: Diagnosis not present

## 2015-07-22 DIAGNOSIS — I2510 Atherosclerotic heart disease of native coronary artery without angina pectoris: Secondary | ICD-10-CM | POA: Diagnosis not present

## 2015-07-22 DIAGNOSIS — I252 Old myocardial infarction: Secondary | ICD-10-CM | POA: Insufficient documentation

## 2015-07-22 DIAGNOSIS — I10 Essential (primary) hypertension: Secondary | ICD-10-CM | POA: Diagnosis not present

## 2015-07-22 DIAGNOSIS — Z794 Long term (current) use of insulin: Secondary | ICD-10-CM | POA: Diagnosis not present

## 2015-07-22 DIAGNOSIS — Z7984 Long term (current) use of oral hypoglycemic drugs: Secondary | ICD-10-CM | POA: Insufficient documentation

## 2015-07-22 DIAGNOSIS — N189 Chronic kidney disease, unspecified: Secondary | ICD-10-CM | POA: Insufficient documentation

## 2015-07-22 DIAGNOSIS — D649 Anemia, unspecified: Secondary | ICD-10-CM | POA: Diagnosis not present

## 2015-07-22 DIAGNOSIS — E1140 Type 2 diabetes mellitus with diabetic neuropathy, unspecified: Secondary | ICD-10-CM | POA: Diagnosis not present

## 2015-07-22 DIAGNOSIS — E785 Hyperlipidemia, unspecified: Secondary | ICD-10-CM | POA: Diagnosis not present

## 2015-07-22 DIAGNOSIS — I130 Hypertensive heart and chronic kidney disease with heart failure and stage 1 through stage 4 chronic kidney disease, or unspecified chronic kidney disease: Secondary | ICD-10-CM | POA: Insufficient documentation

## 2015-07-22 DIAGNOSIS — E039 Hypothyroidism, unspecified: Secondary | ICD-10-CM | POA: Insufficient documentation

## 2015-07-22 DIAGNOSIS — Z7982 Long term (current) use of aspirin: Secondary | ICD-10-CM | POA: Insufficient documentation

## 2015-07-22 DIAGNOSIS — I5022 Chronic systolic (congestive) heart failure: Secondary | ICD-10-CM | POA: Insufficient documentation

## 2015-07-22 DIAGNOSIS — R110 Nausea: Secondary | ICD-10-CM | POA: Diagnosis present

## 2015-07-22 LAB — URINALYSIS COMPLETE WITH MICROSCOPIC (ARMC ONLY)
Bacteria, UA: NONE SEEN
Bilirubin Urine: NEGATIVE
Glucose, UA: >500 mg/dL — AB
Hgb urine dipstick: NEGATIVE
Leukocytes, UA: NEGATIVE
Nitrite: NEGATIVE
Protein, ur: >500 mg/dL — AB
Specific Gravity, Urine: 1.023 (ref 1.005–1.030)
pH: 6 (ref 5.0–8.0)

## 2015-07-22 LAB — CBC
HCT: 31 % — ABNORMAL LOW (ref 35.0–47.0)
Hemoglobin: 10.3 g/dL — ABNORMAL LOW (ref 12.0–16.0)
MCH: 28.8 pg (ref 26.0–34.0)
MCHC: 33.1 g/dL (ref 32.0–36.0)
MCV: 87.1 fL (ref 80.0–100.0)
Platelets: 226 10*3/uL (ref 150–440)
RBC: 3.56 MIL/uL — ABNORMAL LOW (ref 3.80–5.20)
RDW: 16.3 % — ABNORMAL HIGH (ref 11.5–14.5)
WBC: 7 10*3/uL (ref 3.6–11.0)

## 2015-07-22 LAB — COMPREHENSIVE METABOLIC PANEL
ALT: 13 U/L — ABNORMAL LOW (ref 14–54)
AST: 23 U/L (ref 15–41)
Albumin: 2.6 g/dL — ABNORMAL LOW (ref 3.5–5.0)
Alkaline Phosphatase: 79 U/L (ref 38–126)
Anion gap: 9 (ref 5–15)
BUN: 28 mg/dL — ABNORMAL HIGH (ref 6–20)
CO2: 23 mmol/L (ref 22–32)
Calcium: 8.5 mg/dL — ABNORMAL LOW (ref 8.9–10.3)
Chloride: 108 mmol/L (ref 101–111)
Creatinine, Ser: 2.48 mg/dL — ABNORMAL HIGH (ref 0.44–1.00)
GFR calc Af Amer: 23 mL/min — ABNORMAL LOW (ref >60)
GFR calc non Af Amer: 20 mL/min — ABNORMAL LOW (ref >60)
Glucose, Bld: 150 mg/dL — ABNORMAL HIGH (ref 65–99)
Potassium: 3.7 mmol/L (ref 3.5–5.1)
Sodium: 140 mmol/L (ref 135–145)
Total Bilirubin: 1.1 mg/dL (ref 0.3–1.2)
Total Protein: 6 g/dL — ABNORMAL LOW (ref 6.5–8.1)

## 2015-07-22 LAB — TROPONIN I: Troponin I: 0.03 ng/mL (ref <0.031)

## 2015-07-22 LAB — LIPASE, BLOOD: Lipase: 14 U/L (ref 11–51)

## 2015-07-22 LAB — GLUCOSE, CAPILLARY: Glucose-Capillary: 118 mg/dL — ABNORMAL HIGH (ref 65–99)

## 2015-07-22 MED ORDER — CARVEDILOL 6.25 MG PO TABS
6.2500 mg | ORAL_TABLET | Freq: Two times a day (BID) | ORAL | Status: DC
  Administered 2015-07-23: 6.25 mg via ORAL

## 2015-07-22 MED ORDER — ONDANSETRON 4 MG PO TBDP
4.0000 mg | ORAL_TABLET | Freq: Once | ORAL | Status: AC
  Administered 2015-07-22: 4 mg via ORAL
  Filled 2015-07-22: qty 1

## 2015-07-22 MED ORDER — ONDANSETRON 4 MG PO TBDP: 4.0000 mg | ORAL_TABLET | Freq: Three times a day (TID) | ORAL | Status: DC | PRN

## 2015-07-22 MED ORDER — FUROSEMIDE 40 MG PO TABS
40.0000 mg | ORAL_TABLET | Freq: Once | ORAL | Status: AC
  Administered 2015-07-23: 40 mg via ORAL
  Filled 2015-07-22: qty 1

## 2015-07-22 MED ORDER — FUROSEMIDE 20 MG PO TABS
20.0000 mg | ORAL_TABLET | Freq: Every day | ORAL | Status: DC
Start: 2015-07-22 — End: 2015-09-03

## 2015-07-22 NOTE — Patient Instructions (Addendum)
Medication Instructions:  Your physician has recommended you make the following change in your medication:  1. Lasix 20 mg Once daily    Labwork: None ordered  Testing/Procedures: None ordered  Follow-Up: Your physician wants you to follow-up in: 6 months with Dr. Fletcher Anon. You will receive a reminder letter in the mail two months in advance. If you don't receive a letter, please call our office to schedule the follow-up appointment.  You have been referred to hematologist for anemia. If you don't hear from someone by Wednesday please give [place] a call back.     Any Other Special Instructions Will Be Listed Below (If Applicable).     If you need a refill on your cardiac medications before your next appointment, please call your pharmacy.

## 2015-07-22 NOTE — ED Notes (Signed)
CBG-118, done per pt request, states "it feels like my sugar is low."

## 2015-07-22 NOTE — Progress Notes (Signed)
Cardiology Office Note   Date:  07/22/2015   ID:  Jamie Arias, DOB [birth year], MRN [id number]  PCP:  Rubbie Battiest, NP  Cardiologist:   Kathlyn Sacramento, MD   No chief complaint on file.     History of Present Illness: Jamie Arias is a 60 y.o. female who presents for a followup regarding coronary artery disease status post CABG. She has known history of diabetes, chronic kidney disease, anemia, hypertension and hyperlipidemia. She presented in April of 2015 with non-ST elevation myocardial infarction with late presentation. Ejection fraction was 35% with multiple wall motion abnormalities and moderate mitral regurgitation. She underwent cardiac catheterization which showed severe three-vessel coronary artery disease. She underwent [facility]G at [facility]. She had postoperative hypotension and atrial fibrillation.  She had postoperative left pleural effusion that required draining. Most recent echocardiogram in February of 2016 showed an ejection fraction of 55-60% with grade 2 diastolic dysfunction, mild aortic regurgitation and minimal pulmonary hypertension.  She had cholecystectomy done last year. She is reporting increased fatigue and leg edema. She had worsening renal function and anemia. Most recent hemoglobin was 8.5. She has infrequent episodes of upper chest pain. She gained 24 pounds since September.   Past Medical History  Diagnosis Date  . Diabetes mellitus without complication ([place])   . Hypertension   . Hyperlipidemia   . Hypothyroidism   . Diabetic neuropathy ([place])   . Chronic systolic heart failure ([place])     a. Due to ischemic cardiomyopathy. EF as low as 35%, improved to normal s/p CABG; b. echo 07/06/13: EF 55-60%, no RWMAs, mod TR, trivial pericardial effusion not c/w tamponade physiology  . Anemia   . Coronary artery disease     a. NSTEMI 06/2013; b.cath: severe three-vessel CAD w/ EF 30% & mild-mod MR; c. s/p 3 vessel CABG 07/02/13 (LIMA-LAD, SVG-OM, and SVG-RPDA).  .  Myocardial infarction ([place]) 2015  . CHF (congestive heart failure) ([place])   . Chronic kidney disease     EFGR 31  . Pulmonary hypertension ([place])   . Pleural effusion 2015    Past Surgical History  Procedure Laterality Date  . Eye surgery      Cataract both eyes  . Coronary artery bypass graft N/A 07/02/2013    Procedure: CORONARY ARTERY BYPASS GRAFTING (CABG);  Surgeon: Ivin [name], MD;  Location: [place];  Service: Open Heart Surgery;  Laterality: N/A;  CABG x three, using left internal mammary artery and right leg greater saphenous vein harvested endoscopically  . Intraoperative transesophageal echocardiogram N/A 07/02/2013    Procedure: INTRAOPERATIVE TRANSESOPHAGEAL ECHOCARDIOGRAM;  Surgeon: Ivin [name], MD;  Location: [place];  Service: Open Heart Surgery;  Laterality: N/A;  . Thoracentesis Left 2015  . Cholecystectomy N/A 12/09/2014    Procedure: LAPAROSCOPIC CHOLECYSTECTOMY;  Surgeon: Marlyce [name], MD;  Location: [facility];  Service: General;  Laterality: N/A;     Current Outpatient Prescriptions  Medication Sig Dispense Refill  . aspirin EC 81 MG tablet Take 1 tablet (81 mg total) by mouth daily. 90 tablet 3  . atorvastatin (LIPITOR) 80 MG tablet TAKE ONE TABLET BY MOUTH ONCE DAILY (Patient taking differently: TAKE ONE TABLET BY MOUTH ONCE DAILY IN THE AFTERNOON) 90 tablet 3  . blood glucose meter kit and supplies KIT Dispense based on patient and insurance preference. Use up to twice daily. (FOR ICD-10 E11.9) 1 each 0  . carvedilol (COREG) 6.25 MG tablet TAKE ONE TABLET BY MOUTH TWICE DAILY 180 tablet 3  .  doxycycline (VIBRA-TABS) 100 MG tablet Take 1 tablet (100 mg total) by mouth 2 (two) times daily. 14 tablet 0  . fluticasone (FLONASE) 50 MCG/ACT nasal spray Place 2 sprays into both nostrils daily. 16 g 6  . glucose blood test strip Test 4 times daily Dx. E11.9. One Touch ultra strips 100 each 12  . HYDROcodone-acetaminophen (NORCO) 5-325 MG tablet Take 1-2 tablets  by mouth every 4 (four) hours as needed for moderate pain or severe pain. 35 tablet 0  . insulin aspart protamine - aspart (NOVOLOG 70/30 MIX) (70-30) 100 UNIT/ML FlexPen Inject 0.08 mLs (8 Units total) into the skin 2 (two) times daily. 15 mL 11  . Insulin Pen Needle 32G X 6 MM MISC Use with Humalog Kwikpen. 50 each 6  . Insulin Syringe-Needle U-100 (INSULIN SYRINGE .5CC/30GX1/2") 30G X 1/2" 0.5 ML MISC Use as directed 100 each 5  . levothyroxine (SYNTHROID, LEVOTHROID) 75 MCG tablet Take 1 tablet (75 mcg total) by mouth daily. 90 tablet 1  . lisinopril (PRINIVIL,ZESTRIL) 20 MG tablet TAKE ONE TABLET BY MOUTH ONCE DAILY 30 tablet 2  . metFORMIN (GLUCOPHAGE) 500 MG tablet TAKE ONE TABLET BY MOUTH TWICE DAILY WITH MEALS 60 tablet 5  . NITROSTAT 0.4 MG SL tablet DISSOLVE ONE TABLET UNDER THE TONGUE EVERY 5 MINUTES AS NEEDED FOR CHEST PAIN.  DO NOT EXCEED A TOTAL OF 3 DOSES IN 15 MINUTES 25 tablet 1  . ondansetron (ZOFRAN) 4 MG tablet TAKE ONE TABLET BY MOUTH ONCE DAILY AS NEEDED FOR NAUSEA OR  VOMITING 10 tablet 0  . polyethylene glycol (MIRALAX / GLYCOLAX) packet Take 17 g by mouth daily. 30 each 0  . promethazine (PHENERGAN) 12.5 MG tablet TAKE ONE TABLET BY MOUTH EVERY 6 HOURS AS NEEDED FOR NAUSEA AND VOMITING 30 tablet 0  . traZODone (DESYREL) 50 MG tablet TAKE ONE TABLET BY MOUTH AT BEDTIME AS NEEDED FOR SLEEP 30 tablet 0  . furosemide (LASIX) 20 MG tablet Take 1 tablet (20 mg total) by mouth daily. 30 tablet 6   No current facility-administered medications for this visit.    Allergies:   Review of patient's allergies indicates no known allergies.    Social History:  The patient  reports that she has never smoked. She has never used smokeless tobacco. She reports that she does not drink alcohol or use illicit drugs.   Family History:  The patient's family history includes COPD in her mother; Cancer in her mother; Pulmonary embolism in her father.    ROS:  Please see the history of present  illness.   Otherwise, review of systems are positive for none.   All other systems are reviewed and negative.    PHYSICAL EXAM: VS:  BP 150/90 mmHg  Pulse 64  Ht 5' 4" (1.626 m)  Wt 214 lb 12.8 oz (97.433 kg)  BMI 36.85 kg/m2 , BMI Body mass index is 36.85 kg/(m^2). GEN: Well nourished, well developed, in no acute distress HEENT: normal Neck: no JVD, carotid bruits, or masses Cardiac: RRR; no murmurs, rubs, or gallops, mild edema  Respiratory:  clear to auscultation bilaterally, normal work of breathing GI: soft, nontender, nondistended, + BS MS: no deformity or atrophy Skin: warm and dry, no rash Neuro:  Strength and sensation are intact Psych: euthymic mood, full affect   EKG:  EKG is ordered today. The ekg ordered today demonstrates normal sinus rhythm with nonspecific ST and T wave changes.   Recent Labs: 06/24/2015: ALT 13; BUN 37*; Creatinine, Ser  2.65*; Hemoglobin 8.5 Repeated and verified X2.*; Platelets 212.0; Potassium 5.0; Sodium 143; TSH 5.95*    Lipid Panel    Component Value Date/Time   CHOL 194 06/24/2015 0805   CHOL 171 12/18/2013 0815   TRIG 77.0 06/24/2015 0805   HDL 64.20 06/24/2015 0805   HDL 63 12/18/2013 0815   CHOLHDL 3 06/24/2015 0805   CHOLHDL 2.7 12/18/2013 0815   VLDL 15.4 06/24/2015 0805   LDLCALC 114* 06/24/2015 0805   LDLCALC 93 12/18/2013 0815   LDLDIRECT 126.9 10/31/2012 0801      Wt Readings from Last 3 Encounters:  07/22/15 214 lb 12.8 oz (97.433 kg)  07/06/15 221 lb 9.6 oz (100.517 kg)  07/01/15 227 lb (102.967 kg)       ASSESSMENT AND PLAN:  1.  Chronic systolic heart failure: Most recent ejection fraction was normal. She has gained significant weight over the last 6 months with increased leg edema. Diuresis has been limited by chronic kidney disease. I increased the dose of furosemide to 20 mg once daily.  2. Coronary artery disease involving native coronary arteries: Currently with only very rare episodes of atypical chest  pain. Continue medical therapy.  3. Essential hypertension: Blood pressure is elevated today. We might need to consider adding amlodipine.   4. Anemia: This is likely anemia of chronic disease related to chronic kidney disease but this has worsened gradually over the last 6 months which is likely contributing to her symptoms of exertional dyspnea and fatigue. I referred her to hematology.  5. Chronic kidney disease likely due to diabetic nephropathy. She has an outpatient consult with nephrology scheduled.    Disposition:   FU with me in 6 months  Signed,  Kathlyn Sacramento, MD  07/22/2015 5:08 PM    [place]

## 2015-07-22 NOTE — ED Provider Notes (Addendum)
[facility] Emergency Department Provider Note   ____________________________________________  Time seen: Approximately 950 PM  I have reviewed the triage vital signs and the nursing notes.   HISTORY  Chief Complaint Nausea    HPI Jamie Arias is a 60 y.o. female with a history of diabetes as well as CHF and coronary artery disease was presenting with 5 days of nausea and decreased appetite. She says she also feels that she has difficulty swallowing.  Denies any chest pain or shortness of breath. However, she does say that she felt similarly the last time she needed fluid drained from her lung. She was evaluated earlier today by her cardiologist, Dr.Arida, who did an EKG and was able to discharge her home. However, the patient did not feel well and her husband suggested that she come to the emergency department for further evaluation. Patient has had one episode of diarrhea today which was nonbloody. Denies any known sick contacts. Denies any cough, runny nose or fever.   Past Medical History  Diagnosis Date  . Diabetes mellitus without complication ([place])   . Hypertension   . Hyperlipidemia   . Hypothyroidism   . Diabetic neuropathy ([place])   . Chronic systolic heart failure ([place])     a. Due to ischemic cardiomyopathy. EF as low as 35%, improved to normal s/p CABG; b. echo 07/06/13: EF 55-60%, no RWMAs, mod TR, trivial pericardial effusion not c/w tamponade physiology  . Anemia   . Coronary artery disease     a. NSTEMI 06/2013; b.cath: severe three-vessel CAD w/ EF 30% & mild-mod MR; c. s/p 3 vessel CABG 07/02/13 (LIMA-LAD, SVG-OM, and SVG-RPDA).  . Myocardial infarction ([place]) 2015  . CHF (congestive heart failure) ([place])   . Chronic kidney disease     EFGR 31  . Pulmonary hypertension ([place])   . Pleural effusion 2015    Patient Active Problem List   Diagnosis Date Noted  . Nausea without vomiting 07/07/2015  . Sinusitis 07/07/2015  . Encounter for  routine gynecological examination 07/06/2015  . Routine general medical examination at a health care facility 07/06/2015  . Cough 05/25/2015  . Calculus of gallbladder with chronic cholecystitis without obstruction   . Pre-operative cardiovascular examination 11/30/2014  . Bilateral carotid bruits 11/30/2014  . Low magnesium levels 08/10/2013  . Anemia 08/10/2013  . Constipation 08/10/2013  . Postoperative atrial fibrillation ([place]) 07/30/2013  . Coronary artery disease   . CAD (coronary artery disease) 07/02/2013  . Acute systolic heart failure ([place]) 06/30/2013  . NSTEMI (non-ST elevated myocardial infarction) ([place]) 06/29/2013  . Sleep disorder 05/23/2012  . Hypothyroid 04/27/2012  . HTN (hypertension) 04/27/2012  . HLD (hyperlipidemia) 04/27/2012  . Diabetes mellitus, type 2 ([place]) 04/27/2012  . Neuropathy ([place]) 04/27/2012    Past Surgical History  Procedure Laterality Date  . Eye surgery      Cataract both eyes  . Coronary artery bypass graft N/A 07/02/2013    Procedure: CORONARY ARTERY BYPASS GRAFTING (CABG);  Surgeon: Ivin [name], MD;  Location: [place];  Service: Open Heart Surgery;  Laterality: N/A;  CABG x three, using left internal mammary artery and right leg greater saphenous vein harvested endoscopically  . Intraoperative transesophageal echocardiogram N/A 07/02/2013    Procedure: INTRAOPERATIVE TRANSESOPHAGEAL ECHOCARDIOGRAM;  Surgeon: Ivin [name], MD;  Location: [place];  Service: Open Heart Surgery;  Laterality: N/A;  . Thoracentesis Left 2015  . Cholecystectomy N/A 12/09/2014    Procedure: LAPAROSCOPIC CHOLECYSTECTOMY;  Surgeon: Marlyce [name],  MD;  Location: [facility];  Service: General;  Laterality: N/A;    Current Outpatient Rx  Name  Route  Sig  Dispense  Refill  . aspirin EC 81 MG tablet   Oral   Take 1 tablet (81 mg total) by mouth daily.   90 tablet   3   . atorvastatin (LIPITOR) 80 MG tablet      TAKE ONE TABLET BY MOUTH ONCE DAILY Patient  taking differently: TAKE ONE TABLET BY MOUTH ONCE DAILY IN THE AFTERNOON   90 tablet   3   . blood glucose meter kit and supplies KIT      Dispense based on patient and insurance preference. Use up to twice daily. (FOR ICD-10 E11.9)   1 each   0   . carvedilol (COREG) 6.25 MG tablet      TAKE ONE TABLET BY MOUTH TWICE DAILY   180 tablet   3   . doxycycline (VIBRA-TABS) 100 MG tablet   Oral   Take 1 tablet (100 mg total) by mouth 2 (two) times daily.   14 tablet   0   . fluticasone (FLONASE) 50 MCG/ACT nasal spray   Each Nare   Place 2 sprays into both nostrils daily.   16 g   6   . furosemide (LASIX) 20 MG tablet   Oral   Take 1 tablet (20 mg total) by mouth daily.   30 tablet   6   . glucose blood test strip      Test 4 times daily Dx. E11.9. One Touch ultra strips   100 each   12   . HYDROcodone-acetaminophen (NORCO) 5-325 MG tablet   Oral   Take 1-2 tablets by mouth every 4 (four) hours as needed for moderate pain or severe pain.   35 tablet   0   . insulin aspart protamine - aspart (NOVOLOG 70/30 MIX) (70-30) 100 UNIT/ML FlexPen   Subcutaneous   Inject 0.08 mLs (8 Units total) into the skin 2 (two) times daily.   15 mL   11   . Insulin Pen Needle 32G X 6 MM MISC      Use with Humalog Kwikpen.   50 each   6   . Insulin Syringe-Needle U-100 (INSULIN SYRINGE .5CC/30GX1/2") 30G X 1/2" 0.5 ML MISC      Use as directed   100 each   5   . levothyroxine (SYNTHROID, LEVOTHROID) 75 MCG tablet   Oral   Take 1 tablet (75 mcg total) by mouth daily.   90 tablet   1   . lisinopril (PRINIVIL,ZESTRIL) 20 MG tablet      TAKE ONE TABLET BY MOUTH ONCE DAILY   30 tablet   2   . metFORMIN (GLUCOPHAGE) 500 MG tablet      TAKE ONE TABLET BY MOUTH TWICE DAILY WITH MEALS   60 tablet   5   . NITROSTAT 0.4 MG SL tablet      DISSOLVE ONE TABLET UNDER THE TONGUE EVERY 5 MINUTES AS NEEDED FOR CHEST PAIN.  DO NOT EXCEED A TOTAL OF 3 DOSES IN 15 MINUTES   25  tablet   1   . ondansetron (ZOFRAN) 4 MG tablet      TAKE ONE TABLET BY MOUTH ONCE DAILY AS NEEDED FOR NAUSEA OR  VOMITING   10 tablet   0   . polyethylene glycol (MIRALAX / GLYCOLAX) packet   Oral   Take 17 g by mouth daily.  30 each   0   . promethazine (PHENERGAN) 12.5 MG tablet      TAKE ONE TABLET BY MOUTH EVERY 6 HOURS AS NEEDED FOR NAUSEA AND VOMITING   30 tablet   0   . traZODone (DESYREL) 50 MG tablet      TAKE ONE TABLET BY MOUTH AT BEDTIME AS NEEDED FOR SLEEP   30 tablet   0     Allergies Review of patient's allergies indicates no known allergies.  Family History  Problem Relation Age of Onset  . COPD Mother   . Cancer Mother     Lung  . Pulmonary embolism Father     Social History Social History  Substance Use Topics  . Smoking status: Never Smoker   . Smokeless tobacco: Never Used  . Alcohol Use: No    Review of Systems Constitutional: No fever/chills Eyes: No visual changes. ENT: No sore throat. Cardiovascular: Denies chest pain. Respiratory: Denies shortness of breath. Gastrointestinal: No abdominal pain.  no vomiting.  No constipation. Genitourinary: Negative for dysuria. Musculoskeletal: Negative for back pain. Skin: Negative for rash. Neurological: Negative for headaches, focal weakness or numbness.  10-point ROS otherwise negative.  ____________________________________________   PHYSICAL EXAM:  VITAL SIGNS: ED Triage Vitals  Enc Vitals Group     BP 07/22/15 1757 201/93 mmHg     Pulse Rate 07/22/15 1757 64     Resp 07/22/15 1757 18     Temp 07/22/15 1757 97.8 F (36.6 C)     Temp Source 07/22/15 1757 Oral     SpO2 07/22/15 1757 98 %     Weight 07/22/15 1757 214 lb (97.07 kg)     Height 07/22/15 1757 _0  (1.626 m)     Head Cir --      Peak Flow --      Pain Score 07/22/15 2248 0     Pain Loc --      Pain Edu? --      Excl. in [place]? --     Constitutional: Alert and oriented. Well appearing and in no acute  distress. Eyes: Conjunctivae are normal. PERRL. EOMI. Head: Atraumatic. Nose: No congestion/rhinnorhea. Mouth/Throat: Mucous membranes are moist.   Neck: No stridor.   Cardiovascular: Normal rate, regular rhythm. Grossly normal heart sounds.   Respiratory: Normal respiratory effort.  No retractions. Lungs CTAB. Gastrointestinal: Soft and nontender. No distention. No CVA tenderness. Musculoskeletal: No lower extremity tenderness nor edema.  No joint effusions. Neurologic:  Normal speech and language. No gross focal neurologic deficits are appreciated. No gait instability. Skin:  Skin is warm, dry and intact. No rash noted. Psychiatric: Mood and affect are normal. Speech and behavior are normal.  ____________________________________________   LABS (all labs ordered are listed, but only abnormal results are displayed)  Labs Reviewed  COMPREHENSIVE METABOLIC PANEL - Abnormal; Notable for the following:    Glucose, Bld 150 (*)    BUN 28 (*)    Creatinine, Ser 2.48 (*)    Calcium 8.5 (*)    Total Protein 6.0 (*)    Albumin 2.6 (*)    ALT 13 (*)    GFR calc non Af Amer 20 (*)    GFR calc Af Amer 23 (*)    All other components within normal limits  CBC - Abnormal; Notable for the following:    RBC 3.56 (*)    Hemoglobin 10.3 (*)    HCT 31.0 (*)    RDW 16.3 (*)    All  other components within normal limits  URINALYSIS COMPLETEWITH MICROSCOPIC ([facility] ONLY) - Abnormal; Notable for the following:    Color, Urine YELLOW (*)    APPearance HAZY (*)    Glucose, UA >500 (*)    Ketones, ur 1+ (*)    Protein, ur >500 (*)    Squamous Epithelial / LPF 6-30 (*)    All other components within normal limits  GLUCOSE, CAPILLARY - Abnormal; Notable for the following:    Glucose-Capillary 118 (*)    All other components within normal limits  LIPASE, BLOOD  TROPONIN I   ____________________________________________  EKG  ED ECG REPORT I, Doran Stabler, the attending physician,  personally viewed and interpreted this ECG.   Date: 07/22/2015  EKG Time: 2224  Rate: 66  Rhythm: normal sinus rhythm  Axis: Normal axis  Intervals:none  ST&T Change: No ST segment elevation or depression. T-wave inversion in 3, aVF with biphasic T waves in V5 and V6. Almost identical in appearance to the EKG from 11/30/2014.  ____________________________________________  RADIOLOGY  Pending chest x-ray read. ____________________________________________   PROCEDURES    ____________________________________________   INITIAL IMPRESSION / ASSESSMENT AND PLAN / ED COURSE  Pertinent labs & imaging results that were available during my care of the patient were reviewed by me and considered in my medical decision making (see chart for details).  Appears to be a baseline troponin as well as renal function.  ----------------------------------------- 11:27 PM on 07/22/2015 -----------------------------------------  Ordered patient her evening carvedilol she has not taken it this evening. Very reassuring workup with baseline creatinine. Patient is feeling better after Zofran. She will follow up with her primary care doctor as long as the chest x-ray is not show any acute issue. Signed out to Dr. Dahlia Client. Possible viral issue causing the nausea. ____________________________________________   FINAL CLINICAL IMPRESSION(S) / ED DIAGNOSES  Nausea.     NEW MEDICATIONS STARTED DURING THIS VISIT:  New Prescriptions   No medications on file     Note:  This document was prepared using Dragon voice recognition software and may include unintentional dictation errors.    Orbie Pyo, MD 07/22/15 2331  Patient's elevated blood pressures likely related to her not taking her evening medication. Denies any chest pain or shortness of breath. Will prescribe evening dose of carvedilol.  Orbie Pyo, MD 07/22/15 2333  DG Chest 2 View (Final result) Result time:  07/22/15 23:32:15   Final result by Rad Results In Interface (07/22/15 23:32:15)   Narrative:   CLINICAL DATA: Cough for 5 days. Difficulty swallowing. Nausea and diarrhea. Elevated blood pressure.  EXAM: CHEST 2 VIEW  COMPARISON: 08/10/2013  FINDINGS: Postoperative changes in the mediastinum. Mild cardiac enlargement without significant vascular congestion. Small bilateral pleural effusions and basilar atelectasis or infiltration, greater on the left. Slight interstitial pattern to the lung bases may indicate pneumonia or edema.  IMPRESSION: Bilateral pleural effusions and basilar infiltrates, greater on the left. Changes may represent pneumonia or edema.   Electronically Signed By: Lucienne Capers M.D. On: 07/22/2015 23:32     Patient with mild pleural effusions and improved since previous x-ray. No respiratory distress at this time. Says that Dr.ARida earlier today increased her Lasix to 40 mg she has not taken her dose yet today. I will give her first dose here in the emergency department. She'll be following up with her primary care doctor within the next 7 days. Changes may represent pneumonia or edema as read on radiology read of the  x-ray. However, patient without any cough or fever. Negative white count. Do not feel that the clinical presentation is consistent with pneumonia at this time.  Orbie Pyo, MD 07/22/15 [phone]

## 2015-07-22 NOTE — ED Notes (Addendum)
Patient to ER for nausea for a few days, one episode of diarrhea today. Denies any abdominal pain or fevers.  Has h/o HTN (systolic [id number] in ER), but has not had all her BP medications today.

## 2015-07-22 NOTE — Discharge Instructions (Signed)
Nausea, Adult °Nausea is the feeling that you have an upset stomach or have to vomit. Nausea by itself is not likely a serious concern, but it may be an early sign of more serious medical problems. As nausea gets worse, it can lead to vomiting. If vomiting develops, there is the risk of dehydration.  °CAUSES  °· Viral infections. °· Food poisoning. °· Medicines. °· Pregnancy. °· Motion sickness. °· Migraine headaches. °· Emotional distress. °· Severe pain from any source. °· Alcohol intoxication. °HOME CARE INSTRUCTIONS °· Get plenty of rest. °· Ask your caregiver about specific rehydration instructions. °· Eat small amounts of food and sip liquids more often. °· Take all medicines as told by your caregiver. °SEEK MEDICAL CARE IF: °· You have not improved after 2 days, or you get worse. °· You have a headache. °SEEK IMMEDIATE MEDICAL CARE IF:  °· You have a fever. °· You faint. °· You keep vomiting or have blood in your vomit. °· You are extremely weak or dehydrated. °· You have dark or bloody stools. °· You have severe chest or abdominal pain. °MAKE SURE YOU: °· Understand these instructions. °· Will watch your condition. °· Will get help right away if you are not doing well or get worse. °  °This information is not intended to replace advice given to you by your health care provider. Make sure you discuss any questions you have with your health care provider. °  °Document Released: 03/29/2004 Document Revised: 03/12/2014 Document Reviewed: 11/01/2010 °Elsevier Interactive Patient Education ©2016 [employer] ° °

## 2015-07-23 MED ORDER — NITROGLYCERIN 0.4 MG SL SUBL
SUBLINGUAL_TABLET | SUBLINGUAL | Status: AC
  Administered 2015-07-23: 0.4 mg via SUBLINGUAL
  Filled 2015-07-23: qty 1

## 2015-07-23 MED ORDER — NITROGLYCERIN 0.4 MG SL SUBL
0.4000 mg | SUBLINGUAL_TABLET | SUBLINGUAL | Status: DC | PRN
Start: 2015-07-23 — End: 2015-07-23
  Administered 2015-07-23 (×2): 0.4 mg via SUBLINGUAL

## 2015-07-23 MED ORDER — CARVEDILOL 6.25 MG PO TABS
ORAL_TABLET | ORAL | Status: AC
  Administered 2015-07-23: 6.25 mg via ORAL
  Filled 2015-07-23: qty 1

## 2015-07-25 ENCOUNTER — Telehealth: Payer: Self-pay | Admitting: *Deleted

## 2015-07-25 NOTE — Telephone Encounter (Signed)
I would suggest follow-up in the office prior to referral. I'm unsure exactly what I would be referring her to GI for at this time.

## 2015-07-25 NOTE — Telephone Encounter (Signed)
Patient scheduled for abdominal pain consult.

## 2015-07-25 NOTE — Telephone Encounter (Signed)
Patient was seen in the [facility] for gastro issues, patient has requested to have a referral for a gastrologist. She was a former pt of Lorane Gell, and has requested to have Dr. Caryl Bis as her provider, Husband stated that Morey Hummingbird spoke to pt previously about needing to a gastrologist.

## 2015-07-25 NOTE — Telephone Encounter (Signed)
Is gastroenterology referral appropriate.  If so please order.

## 2015-07-27 ENCOUNTER — Ambulatory Visit (INDEPENDENT_AMBULATORY_CARE_PROVIDER_SITE_OTHER): Payer: BLUE CROSS/BLUE SHIELD | Admitting: Family Medicine

## 2015-07-27 ENCOUNTER — Encounter: Payer: Self-pay | Admitting: Family Medicine

## 2015-07-27 VITALS — BP 154/100 | HR 63 | Temp 98.2°F | Ht 64.0 in | Wt 201.4 lb

## 2015-07-27 DIAGNOSIS — I10 Essential (primary) hypertension: Secondary | ICD-10-CM | POA: Diagnosis not present

## 2015-07-27 DIAGNOSIS — R110 Nausea: Secondary | ICD-10-CM | POA: Diagnosis not present

## 2015-07-27 DIAGNOSIS — F32A Depression, unspecified: Secondary | ICD-10-CM

## 2015-07-27 DIAGNOSIS — F329 Major depressive disorder, single episode, unspecified: Secondary | ICD-10-CM

## 2015-07-27 LAB — BASIC METABOLIC PANEL
BUN: 19 mg/dL (ref 6–23)
CO2: 30 mEq/L (ref 19–32)
Calcium: 8.1 mg/dL — ABNORMAL LOW (ref 8.4–10.5)
Chloride: 106 mEq/L (ref 96–112)
Creatinine, Ser: 2.34 mg/dL — ABNORMAL HIGH (ref 0.40–1.20)
GFR: 22.41 mL/min — ABNORMAL LOW (ref >60.00)
Glucose, Bld: 145 mg/dL — ABNORMAL HIGH (ref 70–99)
Potassium: 3.7 mEq/L (ref 3.5–5.1)
Sodium: 142 mEq/L (ref 135–145)

## 2015-07-27 MED ORDER — AMLODIPINE BESYLATE 5 MG PO TABS: 5.0000 mg | ORAL_TABLET | Freq: Every day | ORAL | Status: DC

## 2015-07-27 NOTE — Assessment & Plan Note (Signed)
Patient has had persistent nausea with epigastric fullness. Benign abdominal exam today. Could be related to gastroparesis given her history of diabetes. We will refer to GI for further evaluation given chronicity.

## 2015-07-27 NOTE — Patient Instructions (Signed)
Nice to meet you. Refer you to GI for your nausea and epigastric fullness. We are going to start you on amlodipine for your blood pressure. Please monitor your depression. If this worsens please let us know.  If you develop abdominal pain, vomiting, diarrhea, blood in her stool, chest pain, shortness of breath, worsening depression, thoughts of harming herself or others, or any new or changing symptoms please seek medical attention.

## 2015-07-27 NOTE — Assessment & Plan Note (Signed)
Above goal. Currently taking medications as prescribed. We'll add amlodipine as this is mentioned as the next step in the patient's cardiology note from last week.

## 2015-07-27 NOTE — Assessment & Plan Note (Signed)
Patient notes mild depression surrounding work issues and current health issues. I discussed options for treatment including monitoring, medication, and therapy. Patient opted to monitor while we sort out her other medical issues. If not improving once we obtain answers regarding other issues would consider medication. Given return precautions.

## 2015-07-27 NOTE — Progress Notes (Signed)
Pre visit review using our clinic review tool, if applicable. No additional management support is needed unless otherwise documented below in the visit note. 

## 2015-07-27 NOTE — Progress Notes (Signed)
[name], female   DOB: 07/20/1955, 60 y.o.   MRN: [id number]  [name], MD Phone: [phone]  Jamie Arias is a 60 y.o. female who presents today for follow up.  Nausea: Patient notes she's had intermittent nausea for at least the last 6 months and she had her gallbladder removed. She did have some abdominal pain prior to that. She notes no improvement in her nausea over the last several months. Has been evaluated in the emergency room several times. No epigastric pain though does report a sensation of fullness. No abdominal pain. Notes decreased appetite. Notes hiccuping a lot. Diarrhea 1-2 times a week. No constipation. No vomiting. No urinary symptoms or vaginal symptoms. LMP was in the early 2000s. Phenergan and Zofran have been somewhat helpful.  Patient additionally notes that she has felt depressed intermittently for years. Notes it is catching up with her with with job-related issues and health related issues. Denies SI. Would be interested in starting on medications at some point in the future.Marland Kitchen  HYPERTENSION Disease Monitoring Home BP Monitoring typically in the 150s over 80s Chest pain- no    Dyspnea- no Medications Compliance-  taking lisinopril, carvedilol, and Lasix. Edema- no Per review of patient's cardiology note from last week it appears that they were considering starting on amlodipine if still elevated.  PMH: nonsmoker.   ROS see history of present illness  Objective  Physical Exam Filed Vitals:   07/27/15 0826  BP: 154/100  Pulse: 63  Temp: 98.2 F (36.8 C)    BP Readings from Last 3 Encounters:  07/27/15 154/100  07/23/15 172/73  07/22/15 150/90   Wt Readings from Last 3 Encounters:  07/27/15 201 lb 6.4 oz (91.354 kg)  07/22/15 214 lb (97.07 kg)  07/22/15 214 lb 12.8 oz (97.433 kg)    Physical Exam  Constitutional: She is well-developed, well-nourished, and in no distress.  HENT:  Head: Normocephalic and atraumatic.    Right Ear: External ear normal.  Left Ear: External ear normal.  Cardiovascular: Normal rate, regular rhythm and normal heart sounds.   Pulmonary/Chest: Effort normal and breath sounds normal.  Abdominal: Soft. Bowel sounds are normal. She exhibits no distension. There is no tenderness. There is no rebound and no guarding.  Neurological: She is alert. Gait normal.  Skin: Skin is warm and dry. She is not diaphoretic.  Psychiatric:  Mood depressed, affect mildly depressed     Assessment/Plan: Please see individual problem list.  HTN (hypertension) Above goal. Currently taking medications as prescribed. We'll add amlodipine as this is mentioned as the next step in the patient's cardiology note from last week.  Nausea without vomiting Patient has had persistent nausea with epigastric fullness. Benign abdominal exam today. Could be related to gastroparesis given her history of diabetes. We will refer to GI for further evaluation given chronicity.  Depression Patient notes mild depression surrounding work issues and current health issues. I discussed options for treatment including monitoring, medication, and therapy. Patient opted to monitor while we sort out her other medical issues. If not improving once we obtain answers regarding other issues would consider medication. Given return precautions.    Orders Placed This Encounter  Procedures  . Basic Metabolic Panel (BMET)  . Ambulatory referral to Gastroenterology    Referral Priority:  Routine    Referral Type:  Consultation    Referral Reason:  Specialty Services Required    Number of Visits Requested:  1    Meds ordered this  encounter  Medications  . amLODipine (NORVASC) 5 MG tablet    Sig: Take 1 tablet (5 mg total) by mouth daily.    Dispense:  90 tablet    Refill:  [place], MD [place]

## 2015-07-28 ENCOUNTER — Telehealth: Payer: Self-pay | Admitting: Family Medicine

## 2015-07-28 ENCOUNTER — Encounter: Payer: Self-pay | Admitting: Physician Assistant

## 2015-07-28 MED ORDER — PROMETHAZINE HCL 12.5 MG PO TABS: ORAL_TABLET | ORAL | Status: DC

## 2015-07-28 MED ORDER — ONDANSETRON 4 MG PO TBDP: 4.0000 mg | ORAL_TABLET | Freq: Three times a day (TID) | ORAL | Status: DC | PRN

## 2015-07-28 NOTE — Telephone Encounter (Signed)
Caller name: Jaydyn Kaeo Relationship to patient: spouse Can be reached: [phone]  Reason for call: pt Husband called to check the status of a refill on Zofran. Please advise pt of status.

## 2015-07-28 NOTE — Telephone Encounter (Signed)
Husband is aware that the refills have been sent.

## 2015-07-28 NOTE — Telephone Encounter (Signed)
I apologize. I just sent them to the pharmacy.

## 2015-07-29 ENCOUNTER — Telehealth: Payer: Self-pay | Admitting: Family Medicine

## 2015-07-29 NOTE — Telephone Encounter (Signed)
Pt dropped off short term disability papers to be filled out. Will put in Dr.Sonnenberg's box.

## 2015-07-29 NOTE — Telephone Encounter (Signed)
Papers put on Dr Caryl Bis desk

## 2015-08-02 ENCOUNTER — Other Ambulatory Visit: Payer: Self-pay | Admitting: Family Medicine

## 2015-08-02 MED ORDER — TRAZODONE HCL 50 MG PO TABS
50.0000 mg | ORAL_TABLET | Freq: Every evening | ORAL | Status: DC | PRN
Start: 2015-08-02 — End: 2015-09-01

## 2015-08-02 NOTE — Telephone Encounter (Signed)
Refill sent to pharmacy.   

## 2015-08-02 NOTE — Telephone Encounter (Signed)
Refilled 06/06/15. Patient takes for sleep. A future appointment is made with you 08/2015. Please advise?

## 2015-08-04 DIAGNOSIS — Z7689 Persons encountering health services in other specified circumstances: Secondary | ICD-10-CM

## 2015-08-04 MED ORDER — ONDANSETRON 8 MG PO TBDP
8.0000 mg | ORAL_TABLET | Freq: Three times a day (TID) | ORAL | Status: DC | PRN
Start: 2015-08-04 — End: 2015-08-23

## 2015-08-04 NOTE — Telephone Encounter (Signed)
Called to discuss paperwork. Confirmed details. Patient notes current nausea medications not helping much. Will trial increased dose of Zofran. This is sent to her pharmacy. Paper work placed on [employer].

## 2015-08-05 ENCOUNTER — Ambulatory Visit: Payer: Self-pay | Admitting: Cardiovascular Disease

## 2015-08-05 NOTE — Telephone Encounter (Signed)
Patients husband picked up paperwork.

## 2015-08-11 ENCOUNTER — Ambulatory Visit (INDEPENDENT_AMBULATORY_CARE_PROVIDER_SITE_OTHER): Payer: BLUE CROSS/BLUE SHIELD | Admitting: Physician Assistant

## 2015-08-11 ENCOUNTER — Other Ambulatory Visit: Payer: Self-pay | Admitting: Cardiovascular Disease

## 2015-08-11 ENCOUNTER — Other Ambulatory Visit (INDEPENDENT_AMBULATORY_CARE_PROVIDER_SITE_OTHER): Payer: BLUE CROSS/BLUE SHIELD

## 2015-08-11 ENCOUNTER — Encounter: Payer: Self-pay | Admitting: Physician Assistant

## 2015-08-11 VITALS — BP 188/86 | HR 76 | Ht 64.0 in | Wt 200.0 lb

## 2015-08-11 DIAGNOSIS — D649 Anemia, unspecified: Secondary | ICD-10-CM | POA: Diagnosis not present

## 2015-08-11 DIAGNOSIS — R110 Nausea: Secondary | ICD-10-CM | POA: Diagnosis not present

## 2015-08-11 LAB — IBC PANEL
Iron: 64 ug/dL (ref 42–145)
Saturation Ratios: 23.8 % (ref 20.0–50.0)
Transferrin: 192 mg/dL — ABNORMAL LOW (ref 212.0–360.0)

## 2015-08-11 LAB — FOLATE: Folate: 11.6 ng/mL (ref >5.9)

## 2015-08-11 LAB — FERRITIN: Ferritin: 93.5 ng/mL (ref 10.0–291.0)

## 2015-08-11 LAB — VITAMIN B12: Vitamin B-12: 536 pg/mL (ref 211–911)

## 2015-08-11 MED ORDER — OMEPRAZOLE 40 MG PO CPDR: 40.0000 mg | DELAYED_RELEASE_CAPSULE | Freq: Every day | ORAL | Status: DC

## 2015-08-11 NOTE — Progress Notes (Signed)
Reviewed and agree with documentation and assessment and plan. K. Veena Kela Baccari , MD   

## 2015-08-11 NOTE — Patient Instructions (Addendum)
Please go to the basement level to have your labs drawn.  You have been scheduled for an endoscopy. Please follow written instructions given to you at your visit today. If you use inhalers (even only as needed), please bring them with you on the day of your procedure. Your physician has requested that you go to www.[email or web address] and enter the access code given to you at your visit today. This web site gives a general overview about your procedure. However, you should still follow specific instructions given to you by our office regarding your preparation for the procedure.  You have been scheduled for a gastric emptying scan at [facility] on Friday 08-19-2015 at 7:30 am. Please arrive at  7:15 am prior to your appointment for registration. Please make certain not to have anything to eat or drink after midnight the night before your test. Hold all stomach medications (ex: Zofran, phenergan, Reglan) 48 hours prior to your test. If you need to reschedule your appointment, please contact radiology scheduling at [phone]. _____________________________________________________________________ A gastric-emptying study measures how long it takes for food to move through your stomach. There are several ways to measure stomach emptying. In the most common test, you eat food that contains a small amount of radioactive material. A scanner that detects the movement of the radioactive material is placed over your abdomen to monitor the rate at which food leaves your stomach. This test normally takes about 4 hours to complete.  We sent zofran 8 mg odt and Prilosec 40 mg [employer].  _____________________________________________________________________           If you are age 62 or younger, your body mass index should be between 19-25. Your Body mass index is 34.31 kg/(m^2). If this is out of the aformentioned range listed, please consider follow up with your Primary Care  Provider.   Today your blood pressure was elevated. Please follow up with your Primary Care Provider for blood pressure management.

## 2015-08-11 NOTE — Progress Notes (Signed)
[name], female   DOB: Oct 02, 1955, 60 y.o.   MRN: [id number]   Subjective:    [name], female    DOB: 03-28-1955, 60 y.o.   MRN: [id number]  HPI Jamie Arias is a pleasant 60 year old white female new to GI today referred by Dr. Caryl Arias for evaluation of chronic nausea. Patient has history of adult-onset diabetes mellitus and has been insulin-dependent over the past 6-7 years, also with hypertension, depression, coronary artery disease status post MI and CABG, hyperlipidemia and obesity. Patient had been evaluated in [facility] last year for complaints of nausea which at that time was present for 6-8 weeks. She says she was found to have gallstones and underwent a laparoscopic cholecystectomy at [facility] in October 2016. Up note shows chronic calculus cholecystitis, no IOC was done. Patient states her nausea was a little bit better after the surgery but never resolved and has been fairly persistent since. The nausea has worsened for the past 4-6 weeks.. She says at this point she is nauseated all day long. She's never wakened by nausea at night but she says when she gets up in the morning the nausea is present she does not have any worsening in her symptoms after eating and says she does fill up quickly. No complaints of dysphagia, or odynophagia, no heartburn or indigestion. She says it's just a constant sense of "queasiness". She has been tried on Zofran and Phenergan and feels that the Zofran works the best. She denies any problems with changes in bowel habits melena or hematochezia. She says she was recently given a Cologuard for screening but has not completed that. She has not had colonoscopy. Family history negative for colon cancer polyps. No regular aspirin and NSAIDs or EtOH. Recent labs reviewed and patient appears to have a chronic anemia most recent hemoglobin 10.3 hematocrit of 31, B UN 28 creatinine 2.48 LFTs normal albumin  2.6  Review of Systems Pertinent positive and negative review of systems were noted in the above HPI section.  All other review of systems was otherwise negative.  Outpatient Encounter Prescriptions as of 08/11/2015  Medication Sig  . amLODipine (NORVASC) 5 MG tablet Take 1 tablet (5 mg total) by mouth daily.  Marland Kitchen aspirin EC 81 MG tablet Take 1 tablet (81 mg total) by mouth daily.  Marland Kitchen atorvastatin (LIPITOR) 80 MG tablet TAKE ONE TABLET BY MOUTH ONCE DAILY (Patient taking differently: TAKE ONE TABLET BY MOUTH ONCE DAILY IN THE AFTERNOON)  . blood glucose meter kit and supplies KIT Dispense based on patient and insurance preference. Use up to twice daily. (FOR ICD-10 E11.9)  . carvedilol (COREG) 6.25 MG tablet TAKE ONE TABLET BY MOUTH TWICE DAILY  . fluticasone (FLONASE) 50 MCG/ACT nasal spray Place 2 sprays into both nostrils daily.  . furosemide (LASIX) 20 MG tablet Take 1 tablet (20 mg total) by mouth daily.  Marland Kitchen glucose blood test strip Test 4 times daily Dx. E11.9. One Touch ultra strips  . insulin aspart protamine - aspart (NOVOLOG 70/30 MIX) (70-30) 100 UNIT/ML FlexPen Inject 0.08 mLs (8 Units total) into the skin 2 (two) times daily.  . Insulin Pen Needle 32G X 6 MM MISC Use with Humalog Kwikpen.  . Insulin Syringe-Needle U-100 (INSULIN SYRINGE .5CC/30GX1/2") 30G X 1/2" 0.5 ML MISC Use as directed  . levothyroxine (SYNTHROID, LEVOTHROID) 75 MCG tablet Take 1 tablet (75 mcg total) by mouth daily.  Marland Kitchen lisinopril (PRINIVIL,ZESTRIL) 20 MG tablet TAKE ONE TABLET BY  MOUTH ONCE DAILY  . metFORMIN (GLUCOPHAGE) 500 MG tablet TAKE ONE TABLET BY MOUTH TWICE DAILY WITH MEALS  . NITROSTAT 0.4 MG SL tablet DISSOLVE ONE TABLET UNDER THE TONGUE EVERY 5 MINUTES AS NEEDED FOR CHEST PAIN.  DO NOT EXCEED A TOTAL OF 3 DOSES IN 15 MINUTES  . ondansetron (ZOFRAN) 4 MG tablet TAKE ONE TABLET BY MOUTH ONCE DAILY AS NEEDED FOR NAUSEA OR  VOMITING  . ondansetron (ZOFRAN-ODT) 8 MG disintegrating tablet Take 1 tablet (8 mg  total) by mouth every 8 (eight) hours as needed for nausea or vomiting.  . polyethylene glycol (MIRALAX / GLYCOLAX) packet Take 17 g by mouth daily.  . promethazine (PHENERGAN) 12.5 MG tablet TAKE ONE TABLET BY MOUTH EVERY 6 HOURS AS NEEDED FOR NAUSEA AND VOMITING  . traZODone (DESYREL) 50 MG tablet Take 1 tablet (50 mg total) by mouth at bedtime as needed. for sleep  . omeprazole (PRILOSEC) 40 MG capsule Take 1 capsule (40 mg total) by mouth daily.  . [DISCONTINUED] doxycycline (VIBRA-TABS) 100 MG tablet Take 1 tablet (100 mg total) by mouth 2 (two) times daily.  . [DISCONTINUED] HYDROcodone-acetaminophen (NORCO) 5-325 MG tablet Take 1-2 tablets by mouth every 4 (four) hours as needed for moderate pain or severe pain.   No facility-administered encounter medications on file as of 08/11/2015.   No Known Allergies Patient Active Problem List   Diagnosis Date Noted  . Depression 07/27/2015  . Nausea without vomiting 07/07/2015  . Sinusitis 07/07/2015  . Encounter for routine gynecological examination 07/06/2015  . Routine general medical examination at a health care facility 07/06/2015  . Cough 05/25/2015  . Calculus of gallbladder with chronic cholecystitis without obstruction   . Pre-operative cardiovascular examination 11/30/2014  . Bilateral carotid bruits 11/30/2014  . Low magnesium levels 08/10/2013  . Anemia 08/10/2013  . Constipation 08/10/2013  . Postoperative atrial fibrillation ([place]) 07/30/2013  . Coronary artery disease   . CAD (coronary artery disease) 07/02/2013  . Acute systolic heart failure ([place]) 06/30/2013  . NSTEMI (non-ST elevated myocardial infarction) ([place]) 06/29/2013  . Sleep disorder 05/23/2012  . Hypothyroid 04/27/2012  . HTN (hypertension) 04/27/2012  . HLD (hyperlipidemia) 04/27/2012  . Diabetes mellitus, type 2 ([place]) 04/27/2012  . Neuropathy ([place]) 04/27/2012   Social History   Social History  . Marital Status: Married    Spouse Name: N/A  . Number of  Children: N/A  . Years of Education: N/A   Occupational History  . Not on file.   Social History Main Topics  . Smoking status: Never Smoker   . Smokeless tobacco: Never Used  . Alcohol Use: No  . Drug Use: No  . Sexual Activity: Not on file   Other Topics Concern  . Not on file   Social History Narrative   Patient lives at home with her husband. Patient has 4 adult children.    Ms. Victorino family history includes COPD in her mother; Cancer in her mother; Diabetes in her father and paternal grandfather; Heart disease in her maternal grandmother; Pulmonary embolism in her father. There is no history of Colon cancer, Colon polyps, Esophageal cancer, Pancreatic cancer, or Liver disease.      Objective:    Filed Vitals:   08/11/15 0840 08/11/15 0927  BP: 190/92 188/86  Pulse: 76     Physical Exam  well-developed white female in no acute distress, pleasant accompanied by her husband blood pressure 188/86 pulse in 70s-5 foot 4 weight 200 BMI 34. HEENT ;nontraumatic normocephalic  EOMI PERRLA sclera anicteric, CV; regular rate and rhythm with S1-S2 no murmur rub or gallops does have a sternal incisional scar, Pulmonary ;clear bilaterally, Abdomen ;soft nontender nondistended bowel sounds are active there is no palpable mass or hepatosplenomegaly, Rectal; exam not done, Ext; no clubbing cyanosis or edema skin warm and dry, Neuropsych ;mood and affect appropriate     Assessment & Plan:   #70 60 year old female with multiple comorbidities with 8-9 month history of chronic nausea worse over the past 1 month. Patient underwent laparoscopic cholecystectomy October 2016 because of the chronic nausea with no improvement in her symptoms. We will rule out chronic gastritis, peptic ulcer disease, diabetic gastroparesis #2 coronary artery disease status post MI and CABG #3 chronic kidney disease #4normocytic anemia-question secondary to underlying chronic kidney disease #5 insulin-dependent  diabetes mellitus #6 depression #7 hyperlipidemia  Plan; Start  gastroparesis diet Prescription will be sent for Zofran 8 mg ODT 1 by mouth twice a day, patient encouraged to use scheduled at this time Add Prilosec 40 mg by mouth every morning Schedule for upper endoscopy with Dr. Silverio Decamp . Procedure discussed in detail with the patient and her husband including risks benefits and they're agreeable to proceed Schedule for gastric emptying scan Patient encouraged to complete the Cologuard Will check iron studies B12 and folate and if patient is iron deficient will need colonoscopy Patient will be established with Dr. Silverio Decamp.   Alfredia Ferguson PA-C 08/11/2015   Cc: [name], MD

## 2015-08-12 ENCOUNTER — Encounter: Payer: Self-pay | Admitting: Gastroenterology

## 2015-08-12 LAB — COLOGUARD

## 2015-08-18 ENCOUNTER — Ambulatory Visit (AMBULATORY_SURGERY_CENTER): Payer: BLUE CROSS/BLUE SHIELD | Admitting: Gastroenterology

## 2015-08-18 ENCOUNTER — Encounter: Payer: Self-pay | Admitting: Gastroenterology

## 2015-08-18 VITALS — BP 177/86 | HR 66 | Temp 98.4°F | Resp 9 | Ht 64.0 in | Wt 200.0 lb

## 2015-08-18 DIAGNOSIS — K319 Disease of stomach and duodenum, unspecified: Secondary | ICD-10-CM

## 2015-08-18 DIAGNOSIS — R110 Nausea: Secondary | ICD-10-CM | POA: Diagnosis present

## 2015-08-18 LAB — GLUCOSE, CAPILLARY
Glucose-Capillary: 180 mg/dL — ABNORMAL HIGH (ref 65–99)
Glucose-Capillary: 179 mg/dL — ABNORMAL HIGH (ref 65–99)

## 2015-08-18 MED ORDER — SODIUM CHLORIDE 0.9 % IV SOLN: 500.0000 mL | INTRAVENOUS | Status: DC

## 2015-08-18 NOTE — Progress Notes (Signed)
Pt reports being given NTG tablet when she went to the ED about 4 weeks ago for elevated BP and Nausea, no CP at that time. [id number]

## 2015-08-18 NOTE — Op Note (Signed)
[facility] Patient Name: Jamie Arias Procedure Date: 08/18/2015 11:26 AM MRN: [id number] Endoscopist: Mauri Pole , MD Age: 60 Referring MD:  Date of Birth: Jun 10, 1955 Gender: Female Procedure:                Upper GI endoscopy Indications:              Upper abdominal symptoms that persist despite an                            appropriate trial of therapy, Upper abdominal                            symptoms associated with anorexia (suggesting                            structural disease) Medicines:                Monitored Anesthesia Care Procedure:                Pre-Anesthesia Assessment:                           - Prior to the procedure, a History and Physical                            was performed, and patient medications and                            allergies were reviewed. The patient's tolerance of                            previous anesthesia was also reviewed. The risks                            and benefits of the procedure and the sedation                            options and risks were discussed with the patient.                            All questions were answered, and informed consent                            was obtained. Prior Anticoagulants: The patient has                            taken no previous anticoagulant or antiplatelet                            agents. ASA Grade Assessment: III - A patient with                            severe systemic disease. After reviewing the risks  and benefits, the patient was deemed in                            satisfactory condition to undergo the procedure.                           After obtaining informed consent, the endoscope was                            passed under direct vision. Throughout the                            procedure, the patient's blood pressure, pulse, and                            oxygen saturations were monitored continuously. The                      Model [id number]) scope was introduced                            through the mouth, and advanced to the second part                            of duodenum. The upper GI endoscopy was                            accomplished without difficulty. The patient                            tolerated the procedure well. Scope In: Scope Out: Findings:                 The esophagus was normal.                           Patchy mildly erythematous mucosa without bleeding                            was found in the gastric body. Biopsies were taken                            with a cold forceps for Helicobacter pylori testing.                           The duodenal bulb and second portion of the                            duodenum were normal. Biopsies for histology were                            taken with a cold forceps for evaluation of celiac                            disease. Complications:  No immediate complications. Estimated Blood Loss:     Estimated blood loss: none. Impression:               - Normal esophagus.                           - Erythematous mucosa in the gastric body. Biopsied.                           - Normal duodenal bulb and second portion of the                            duodenum. Biopsied. Recommendation:           - Patient has a contact number available for                            emergencies. The signs and symptoms of potential                            delayed complications were discussed with the                            patient. Return to normal activities tomorrow.                            Written discharge instructions were provided to the                            patient.                           - Resume previous diet.                           - Continue present medications.                           - Await pathology results.                           - No repeat upper endoscopy.                            - Return to GI office PRN. Mauri Pole, MD 08/18/2015 11:46:46 AM This report has been signed electronically.

## 2015-08-18 NOTE — Patient Instructions (Signed)
Impression/recommendations:  Gastritis (handout given per Dr. Silverio Decamp)  Continue current medications  YOU HAD AN ENDOSCOPIC PROCEDURE TODAY AT [facility]:   Refer to the procedure report that was given to you for any specific questions about what was found during the examination.  If the procedure report does not answer your questions, please call your gastroenterologist to clarify.  If you requested that your care partner not be given the details of your procedure findings, then the procedure report has been included in a sealed envelope for you to review at your convenience later.  YOU SHOULD EXPECT: Some feelings of bloating in the abdomen. Passage of more gas than usual.  Walking can help get rid of the air that was put into your GI tract during the procedure and reduce the bloating. If you had a lower endoscopy (such as a colonoscopy or flexible sigmoidoscopy) you may notice spotting of blood in your stool or on the toilet paper. If you underwent a bowel prep for your procedure, you may not have a normal bowel movement for a few days.  Please Note:  You might notice some irritation and congestion in your nose or some drainage.  This is from the oxygen used during your procedure.  There is no need for concern and it should clear up in a day or so.  SYMPTOMS TO REPORT IMMEDIATELY:   Following upper endoscopy (EGD)  Vomiting of blood or coffee ground material  New chest pain or pain under the shoulder blades  Painful or persistently difficult swallowing  New shortness of breath  Fever of 100F or higher  Black, tarry-looking stools  For urgent or emergent issues, a gastroenterologist can be reached at any hour by calling [phone].   DIET: Your first meal following the procedure should be a small meal and then it is ok to progress to your normal diet. Heavy or fried foods are harder to digest and may make you feel nauseous or bloated.  Likewise, meals heavy in  dairy and vegetables can increase bloating.  Drink plenty of fluids but you should avoid alcoholic beverages for 24 hours.  ACTIVITY:  You should plan to take it easy for the rest of today and you should NOT DRIVE or use heavy machinery until tomorrow (because of the sedation medicines used during the test).    FOLLOW UP: Our staff will call the number listed on your records the next business day following your procedure to check on you and address any questions or concerns that you may have regarding the information given to you following your procedure. If we do not reach you, we will leave a message.  However, if you are feeling well and you are not experiencing any problems, there is no need to return our call.  We will assume that you have returned to your regular daily activities without incident.  If any biopsies were taken you will be contacted by phone or by letter within the next 1-3 weeks.  Please call us at [phone] if you have not heard about the biopsies in 3 weeks.    SIGNATURES/CONFIDENTIALITY: You and/or your care partner have signed paperwork which will be entered into your electronic medical record.  These signatures attest to the fact that that the information above on your After Visit Summary has been reviewed and is understood.  Full responsibility of the confidentiality of this discharge information lies with you and/or your care-partner.

## 2015-08-18 NOTE — Progress Notes (Signed)
Called to room to assist during endoscopic procedure.  Patient ID and intended procedure confirmed with present staff. Received instructions for my participation in the procedure from the performing physician.  

## 2015-08-18 NOTE — Progress Notes (Signed)
Report to PACU, RN, vss, BBS= Clear.  

## 2015-08-19 ENCOUNTER — Ambulatory Visit: Payer: BLUE CROSS/BLUE SHIELD | Admitting: Family Medicine

## 2015-08-19 ENCOUNTER — Ambulatory Visit (HOSPITAL_COMMUNITY)
Admission: RE | Admit: 2015-08-19 | Discharge: 2015-08-19 | Disposition: A | Discharge status: Home / Self Care | Payer: BLUE CROSS/BLUE SHIELD | Source: Ambulatory Visit | Attending: Physician Assistant | Admitting: Physician Assistant

## 2015-08-19 ENCOUNTER — Telehealth: Payer: Self-pay

## 2015-08-19 DIAGNOSIS — R110 Nausea: Secondary | ICD-10-CM

## 2015-08-19 DIAGNOSIS — D649 Anemia, unspecified: Secondary | ICD-10-CM | POA: Insufficient documentation

## 2015-08-19 MED ORDER — TECHNETIUM TC 99M SULFUR COLLOID
1.9500 | Freq: Once | INTRAVENOUS | Status: AC | PRN
  Administered 2015-08-19: 1.95 via INTRAVENOUS

## 2015-08-19 NOTE — Telephone Encounter (Signed)
  Follow up Call-  Call back number 08/18/2015  Post procedure Call Back phone  # [phone]  Permission to leave phone message Yes     Patient was called for follow up after her procedure on 08/18/2015. I spoke with [name]'s husband and he reports that Jamie Arias has returned to her normal daily activities without any difficulty.

## 2015-08-23 ENCOUNTER — Other Ambulatory Visit: Payer: Self-pay | Admitting: Family Medicine

## 2015-08-23 NOTE — Telephone Encounter (Signed)
Please advise for refill, thanks 

## 2015-08-25 ENCOUNTER — Telehealth: Payer: Self-pay | Admitting: *Deleted

## 2015-08-25 ENCOUNTER — Other Ambulatory Visit: Payer: Self-pay

## 2015-08-25 ENCOUNTER — Telehealth: Payer: Self-pay | Admitting: Gastroenterology

## 2015-08-25 DIAGNOSIS — A048 Other specified bacterial intestinal infections: Secondary | ICD-10-CM

## 2015-08-25 MED ORDER — BIS SUBCIT-METRONID-TETRACYC 140-125-125 MG PO CAPS: 3.0000 | ORAL_CAPSULE | Freq: Three times a day (TID) | ORAL | Status: DC

## 2015-08-25 NOTE — Telephone Encounter (Signed)
Patient was notified that her medication was put in to be picked up since yesterday.

## 2015-08-25 NOTE — Telephone Encounter (Signed)
Patient's husband stated that patient was to receive a Rx for a medication, he was not sure of the name,however it was something like zolfran and phenergan. [employer] [phone]

## 2015-08-25 NOTE — Telephone Encounter (Signed)
Patient's insurance covers only covers a portion of the cost of [place]. Her cost is over $900.00 !0 day sample secured for the patient. She will pick it up tomorrow.

## 2015-08-29 ENCOUNTER — Other Ambulatory Visit: Payer: Self-pay | Admitting: Family Medicine

## 2015-08-30 ENCOUNTER — Other Ambulatory Visit: Payer: Self-pay | Admitting: Nurse Practitioner

## 2015-08-31 NOTE — Telephone Encounter (Signed)
Can we refill this. Just filled 08/29/15

## 2015-09-01 ENCOUNTER — Other Ambulatory Visit: Payer: Self-pay | Admitting: Family Medicine

## 2015-09-01 ENCOUNTER — Encounter: Payer: Self-pay | Admitting: Family Medicine

## 2015-09-01 NOTE — Telephone Encounter (Signed)
Please advise on refill.

## 2015-09-02 ENCOUNTER — Observation Stay
Admission: EM | Admit: 2015-09-02 | Discharge: 2015-09-03 | Disposition: A | Discharge status: Home / Self Care | Payer: BLUE CROSS/BLUE SHIELD | Attending: Internal Medicine | Admitting: Internal Medicine

## 2015-09-02 ENCOUNTER — Telehealth: Payer: Self-pay | Admitting: Family Medicine

## 2015-09-02 ENCOUNTER — Encounter: Payer: Self-pay | Admitting: Family Medicine

## 2015-09-02 ENCOUNTER — Encounter: Payer: Self-pay | Admitting: Emergency Medicine

## 2015-09-02 ENCOUNTER — Ambulatory Visit (INDEPENDENT_AMBULATORY_CARE_PROVIDER_SITE_OTHER): Payer: BLUE CROSS/BLUE SHIELD | Admitting: Family Medicine

## 2015-09-02 VITALS — BP 138/72 | HR 69 | Temp 98.5°F | Ht 64.0 in | Wt 203.8 lb

## 2015-09-02 DIAGNOSIS — Z8249 Family history of ischemic heart disease and other diseases of the circulatory system: Secondary | ICD-10-CM | POA: Insufficient documentation

## 2015-09-02 DIAGNOSIS — E785 Hyperlipidemia, unspecified: Secondary | ICD-10-CM | POA: Diagnosis not present

## 2015-09-02 DIAGNOSIS — Z79899 Other long term (current) drug therapy: Secondary | ICD-10-CM | POA: Diagnosis not present

## 2015-09-02 DIAGNOSIS — I959 Hypotension, unspecified: Secondary | ICD-10-CM | POA: Diagnosis present

## 2015-09-02 DIAGNOSIS — N179 Acute kidney failure, unspecified: Secondary | ICD-10-CM | POA: Diagnosis present

## 2015-09-02 DIAGNOSIS — F329 Major depressive disorder, single episode, unspecified: Secondary | ICD-10-CM

## 2015-09-02 DIAGNOSIS — Z833 Family history of diabetes mellitus: Secondary | ICD-10-CM | POA: Insufficient documentation

## 2015-09-02 DIAGNOSIS — J329 Chronic sinusitis, unspecified: Secondary | ICD-10-CM | POA: Insufficient documentation

## 2015-09-02 DIAGNOSIS — I5022 Chronic systolic (congestive) heart failure: Secondary | ICD-10-CM | POA: Diagnosis not present

## 2015-09-02 DIAGNOSIS — R600 Localized edema: Secondary | ICD-10-CM | POA: Diagnosis not present

## 2015-09-02 DIAGNOSIS — E1122 Type 2 diabetes mellitus with diabetic chronic kidney disease: Principal | ICD-10-CM

## 2015-09-02 DIAGNOSIS — R748 Abnormal levels of other serum enzymes: Secondary | ICD-10-CM | POA: Diagnosis present

## 2015-09-02 DIAGNOSIS — I130 Hypertensive heart and chronic kidney disease with heart failure and stage 1 through stage 4 chronic kidney disease, or unspecified chronic kidney disease: Secondary | ICD-10-CM | POA: Insufficient documentation

## 2015-09-02 DIAGNOSIS — Z951 Presence of aortocoronary bypass graft: Secondary | ICD-10-CM | POA: Insufficient documentation

## 2015-09-02 DIAGNOSIS — E039 Hypothyroidism, unspecified: Secondary | ICD-10-CM | POA: Diagnosis present

## 2015-09-02 DIAGNOSIS — I10 Essential (primary) hypertension: Secondary | ICD-10-CM | POA: Diagnosis present

## 2015-09-02 DIAGNOSIS — E1140 Type 2 diabetes mellitus with diabetic neuropathy, unspecified: Secondary | ICD-10-CM | POA: Diagnosis not present

## 2015-09-02 DIAGNOSIS — Z9842 Cataract extraction status, left eye: Secondary | ICD-10-CM | POA: Insufficient documentation

## 2015-09-02 DIAGNOSIS — Z9049 Acquired absence of other specified parts of digestive tract: Secondary | ICD-10-CM | POA: Diagnosis not present

## 2015-09-02 DIAGNOSIS — R7989 Other specified abnormal findings of blood chemistry: Secondary | ICD-10-CM

## 2015-09-02 DIAGNOSIS — Z794 Long term (current) use of insulin: Secondary | ICD-10-CM | POA: Insufficient documentation

## 2015-09-02 DIAGNOSIS — R110 Nausea: Secondary | ICD-10-CM | POA: Diagnosis not present

## 2015-09-02 DIAGNOSIS — Z7982 Long term (current) use of aspirin: Secondary | ICD-10-CM | POA: Diagnosis not present

## 2015-09-02 DIAGNOSIS — Z801 Family history of malignant neoplasm of trachea, bronchus and lung: Secondary | ICD-10-CM | POA: Insufficient documentation

## 2015-09-02 DIAGNOSIS — I252 Old myocardial infarction: Secondary | ICD-10-CM | POA: Diagnosis not present

## 2015-09-02 DIAGNOSIS — Z825 Family history of asthma and other chronic lower respiratory diseases: Secondary | ICD-10-CM | POA: Diagnosis not present

## 2015-09-02 DIAGNOSIS — E119 Type 2 diabetes mellitus without complications: Secondary | ICD-10-CM

## 2015-09-02 DIAGNOSIS — Z8261 Family history of arthritis: Secondary | ICD-10-CM | POA: Insufficient documentation

## 2015-09-02 DIAGNOSIS — I255 Ischemic cardiomyopathy: Secondary | ICD-10-CM | POA: Insufficient documentation

## 2015-09-02 DIAGNOSIS — N189 Chronic kidney disease, unspecified: Secondary | ICD-10-CM | POA: Insufficient documentation

## 2015-09-02 DIAGNOSIS — I951 Orthostatic hypotension: Secondary | ICD-10-CM

## 2015-09-02 DIAGNOSIS — I4891 Unspecified atrial fibrillation: Secondary | ICD-10-CM | POA: Diagnosis not present

## 2015-09-02 DIAGNOSIS — I2510 Atherosclerotic heart disease of native coronary artery without angina pectoris: Secondary | ICD-10-CM | POA: Diagnosis not present

## 2015-09-02 DIAGNOSIS — Z9841 Cataract extraction status, right eye: Secondary | ICD-10-CM | POA: Insufficient documentation

## 2015-09-02 DIAGNOSIS — K5900 Constipation, unspecified: Secondary | ICD-10-CM | POA: Diagnosis not present

## 2015-09-02 DIAGNOSIS — D649 Anemia, unspecified: Secondary | ICD-10-CM | POA: Diagnosis not present

## 2015-09-02 DIAGNOSIS — I272 Other secondary pulmonary hypertension: Secondary | ICD-10-CM | POA: Insufficient documentation

## 2015-09-02 DIAGNOSIS — F32A Depression, unspecified: Secondary | ICD-10-CM

## 2015-09-02 LAB — COMPREHENSIVE METABOLIC PANEL
ALT: 10 U/L (ref 0–35)
AST: 16 U/L (ref 0–37)
Albumin: 2.2 g/dL — ABNORMAL LOW (ref 3.5–5.2)
Alkaline Phosphatase: 58 U/L (ref 39–117)
BUN: 32 mg/dL — ABNORMAL HIGH (ref 6–23)
CO2: 27 mEq/L (ref 19–32)
Calcium: 8 mg/dL — ABNORMAL LOW (ref 8.4–10.5)
Chloride: 107 mEq/L (ref 96–112)
Creatinine, Ser: 3.33 mg/dL — ABNORMAL HIGH (ref 0.40–1.20)
GFR: 14.91 mL/min — CL (ref >60.00)
Glucose, Bld: 159 mg/dL — ABNORMAL HIGH (ref 70–99)
Potassium: 5.3 mEq/L — ABNORMAL HIGH (ref 3.5–5.1)
Sodium: 136 mEq/L (ref 135–145)
Total Bilirubin: 0.4 mg/dL (ref 0.2–1.2)
Total Protein: 5.1 g/dL — ABNORMAL LOW (ref 6.0–8.3)

## 2015-09-02 LAB — CBC WITH DIFFERENTIAL/PLATELET
Basophils Absolute: 0 10*3/uL (ref 0–0.1)
Basophils Relative: 1 %
Eosinophils Absolute: 0.5 10*3/uL (ref 0–0.7)
Eosinophils Relative: 6 %
HCT: 31 % — ABNORMAL LOW (ref 35.0–47.0)
Hemoglobin: 10.3 g/dL — ABNORMAL LOW (ref 12.0–16.0)
Lymphocytes Relative: 23 %
Lymphs Abs: 1.7 10*3/uL (ref 1.0–3.6)
MCH: 29.1 pg (ref 26.0–34.0)
MCHC: 33.3 g/dL (ref 32.0–36.0)
MCV: 87.3 fL (ref 80.0–100.0)
Monocytes Absolute: 0.5 10*3/uL (ref 0.2–0.9)
Monocytes Relative: 7 %
Neutro Abs: 4.9 10*3/uL (ref 1.4–6.5)
Neutrophils Relative %: 63 %
Platelets: 212 10*3/uL (ref 150–440)
RBC: 3.55 MIL/uL — ABNORMAL LOW (ref 3.80–5.20)
RDW: 17.2 % — ABNORMAL HIGH (ref 11.5–14.5)
WBC: 7.7 10*3/uL (ref 3.6–11.0)

## 2015-09-02 LAB — BASIC METABOLIC PANEL
Anion gap: 5 (ref 5–15)
BUN: 32 mg/dL — ABNORMAL HIGH (ref 6–20)
CO2: 23 mmol/L (ref 22–32)
Calcium: 8.1 mg/dL — ABNORMAL LOW (ref 8.9–10.3)
Chloride: 108 mmol/L (ref 101–111)
Creatinine, Ser: 3.46 mg/dL — ABNORMAL HIGH (ref 0.44–1.00)
GFR calc Af Amer: 15 mL/min — ABNORMAL LOW (ref >60)
GFR calc non Af Amer: 13 mL/min — ABNORMAL LOW (ref >60)
Glucose, Bld: 142 mg/dL — ABNORMAL HIGH (ref 65–99)
Potassium: 5 mmol/L (ref 3.5–5.1)
Sodium: 136 mmol/L (ref 135–145)

## 2015-09-02 LAB — CBC
HCT: 30.2 % — ABNORMAL LOW (ref 36.0–46.0)
Hemoglobin: 9.9 g/dL — ABNORMAL LOW (ref 12.0–15.0)
MCHC: 32.7 g/dL (ref 30.0–36.0)
MCV: 86.9 fl (ref 78.0–100.0)
Platelets: 233 10*3/uL (ref 150.0–400.0)
RBC: 3.47 Mil/uL — ABNORMAL LOW (ref 3.87–5.11)
RDW: 17 % — ABNORMAL HIGH (ref 11.5–15.5)
WBC: 7.4 10*3/uL (ref 4.0–10.5)

## 2015-09-02 MED ORDER — AMLODIPINE BESYLATE 5 MG PO TABS: 2.5000 mg | ORAL_TABLET | Freq: Every day | ORAL | Status: DC

## 2015-09-02 MED ORDER — SODIUM CHLORIDE 0.9 % IV BOLUS (SEPSIS)
1000.0000 mL | Freq: Once | INTRAVENOUS | Status: AC
  Administered 2015-09-02: 1000 mL via INTRAVENOUS

## 2015-09-02 NOTE — Patient Instructions (Signed)
Nice to see you. Please complete treatment for H. pylori. Please cut your amlodipine in half and monitor for lightheadedness. She monitor your blood pressure daily. Your goal is less than 130/90. Please continue to monitor blood sugars. If you develop abdominal pain, vomiting, diarrhea, blood in her stool, worsening lightheadedness, or any new or changing symptoms please seek medical attention.

## 2015-09-02 NOTE — Assessment & Plan Note (Signed)
Patient with continued issues with this. Likely related to H. pylori infection. She'll complete treatment for this and follow-up with GI. Continue Zofran as needed. Given return precautions.

## 2015-09-02 NOTE — H&P (Signed)
[facility] [place] [facility] NAME: Jamie Arias    MR#:  [id number]  DATE OF BIRTH:  07/12/1955  DATE OF ADMISSION:  09/02/2015  PRIMARY CARE PHYSICIAN: [name], MD   REQUESTING/REFERRING PHYSICIAN: Archie Balboa, MD  CHIEF COMPLAINT:   Chief Complaint  Patient presents with  . abnormal labs     HISTORY OF PRESENT ILLNESS:  Jamie Arias  is a 60 y.o. female who presents with Acute on chronic renal failure. Patient went to follow up with her primary physician today and had labs done and was found to have an increase in her creatinine. She was sent to the ED. Here labs confirm the same, and she was also found to be orthostatic. She was given fluids in the ED, but her orthostasis persisted. Given her history of heart failure was felt that she needed more gentle administration of fluids or a longer period of time, so hospitals were called for admission. On interview the patient does state that she recently had her Lasix dose increased, which may be contributing to her renal failure and orthostasis.  PAST MEDICAL HISTORY:   Past Medical History  Diagnosis Date  . Diabetes mellitus without complication ([place])   . Hypertension   . Hyperlipidemia   . Hypothyroidism   . Diabetic neuropathy ([place])   . Chronic systolic heart failure ([place])     a. Due to ischemic cardiomyopathy. EF as low as 35%, improved to normal s/p CABG; b. echo 07/06/13: EF 55-60%, no RWMAs, mod TR, trivial pericardial effusion not c/w tamponade physiology  . Anemia   . Coronary artery disease     a. NSTEMI 06/2013; b.cath: severe three-vessel CAD w/ EF 30% & mild-mod MR; c. s/p 3 vessel CABG 07/02/13 (LIMA-LAD, SVG-OM, and SVG-RPDA).  . Myocardial infarction ([place]) 2015  . CHF (congestive heart failure) ([place])   . Chronic kidney disease     EFGR 31  . Pulmonary hypertension ([place])   . Pleural effusion 2015    PAST SURGICAL HISTORY:   Past Surgical History  Procedure  Laterality Date  . Cataract extraction Bilateral   . Coronary artery bypass graft N/A 07/02/2013    Procedure: CORONARY ARTERY BYPASS GRAFTING (CABG);  Surgeon: Ivin [name], MD;  Location: [facility];  Service: Open Heart Surgery;  Laterality: N/A;  CABG x three, using left internal mammary artery and right leg greater saphenous vein harvested endoscopically  . Intraoperative transesophageal echocardiogram N/A 07/02/2013    Procedure: INTRAOPERATIVE TRANSESOPHAGEAL ECHOCARDIOGRAM;  Surgeon: Ivin [name], MD;  Location: [facility];  Service: Open Heart Surgery;  Laterality: N/A;  . Thoracentesis Left 2015  . Cholecystectomy N/A 12/09/2014    Procedure: LAPAROSCOPIC CHOLECYSTECTOMY;  Surgeon: Marlyce [name], MD;  Location: [facility];  Service: General;  Laterality: N/A;    SOCIAL HISTORY:   Social History  Substance Use Topics  . Smoking status: Never Smoker   . Smokeless tobacco: Never Used  . Alcohol Use: No    FAMILY HISTORY:   Family History  Problem Relation Age of Onset  . COPD Mother   . Cancer Mother     Lung  . Pulmonary embolism Father   . Diabetes Paternal Grandfather   . Diabetes Father   . Heart disease Maternal Grandmother   . Colon cancer Neg Hx   . Colon polyps Neg Hx   . Esophageal cancer Neg Hx   . Pancreatic cancer Neg Hx   . Liver disease Neg Hx  DRUG ALLERGIES:  No Known Allergies  MEDICATIONS AT HOME:   Prior to Admission medications   Medication Sig Start Date End Date Taking? Authorizing Provider  amLODipine (NORVASC) 5 MG tablet Take 0.5 tablets (2.5 mg total) by mouth daily. 09/02/15   Glori [name], MD  aspirin EC 81 MG tablet Take 1 tablet (81 mg total) by mouth daily. 11/30/14   Iran Ouch, MD  atorvastatin (LIPITOR) 80 MG tablet TAKE ONE TABLET BY MOUTH ONCE DAILY Patient taking differently: TAKE ONE TABLET BY MOUTH ONCE DAILY IN THE AFTERNOON 01/26/15   Iran Ouch, MD  bismuth-metronidazole-tetracycline [facility])  [phone] MG capsule Take 3 capsules by mouth 4 (four) times daily -  before meals and at bedtime. 08/25/15   Napoleon Form, MD  blood glucose meter kit and supplies KIT Dispense based on patient and insurance preference. Use up to twice daily. (FOR ICD-10 E11.9) 07/01/15   Carollee Leitz, NP  carvedilol (COREG) 6.25 MG tablet TAKE ONE TABLET BY MOUTH TWICE DAILY 01/03/15   Iran Ouch, MD  fluticasone (FLONASE) 50 MCG/ACT nasal spray Place 2 sprays into both nostrils daily. Patient not taking: Reported on 08/18/2015 04/21/15   Carollee Leitz, NP  furosemide (LASIX) 20 MG tablet Take 1 tablet (20 mg total) by mouth daily. 07/22/15   Iran Ouch, MD  furosemide (LASIX) 40 MG tablet TAKE ONE TABLET BY MOUTH ONCE DAILY *CALL OFFICE AND SCHEDULE APPOINTMENT TO SEE DOCTOR BEFORE ANY FURTHER REFILLS* 08/12/15   Iran Ouch, MD  glucose blood test strip Test 4 times daily Dx. E11.9. One Touch ultra strips 04/19/14   Carollee Leitz, NP  insulin aspart protamine - aspart (NOVOLOG 70/30 MIX) (70-30) 100 UNIT/ML FlexPen Inject 0.08 mLs (8 Units total) into the skin 2 (two) times daily. 06/20/15   Carollee Leitz, NP  Insulin Pen Needle 32G X 6 MM MISC Use with Humalog Kwikpen. 06/20/15   Carollee Leitz, NP  Insulin Syringe-Needle U-100 (INSULIN SYRINGE .5CC/30GX1/2") 30G X 1/2" 0.5 ML MISC Use as directed 06/20/15   Carollee Leitz, NP  levothyroxine (SYNTHROID, LEVOTHROID) 75 MCG tablet Take 1 tablet (75 mcg total) by mouth daily. 07/01/15   Carollee Leitz, NP  lisinopril (PRINIVIL,ZESTRIL) 20 MG tablet TAKE ONE TABLET BY MOUTH ONCE DAILY 01/26/15   Carollee Leitz, NP  metFORMIN (GLUCOPHAGE) 500 MG tablet TAKE ONE TABLET BY MOUTH TWICE DAILY WITH MEALS 07/12/15   Carollee Leitz, NP  NITROSTAT 0.4 MG SL tablet DISSOLVE ONE TABLET UNDER THE TONGUE EVERY 5 MINUTES AS NEEDED FOR CHEST PAIN.  DO NOT EXCEED A TOTAL OF 3 DOSES IN 15 MINUTES 07/06/15   Iran Ouch, MD  omeprazole (PRILOSEC) 40 MG capsule Take 1 capsule  (40 mg total) by mouth daily. 08/11/15   Amy S Esterwood, PA-C  ondansetron (ZOFRAN) 4 MG tablet TAKE ONE TABLET BY MOUTH ONCE DAILY AS NEEDED FOR NAUSEA AND VOMITING 08/31/15   Glori [name], MD  ondansetron (ZOFRAN-ODT) 8 MG disintegrating tablet DISSOLVE ONE TABLET IN MOUTH EVERY 8 HOURS AS NEEDED FOR NAUSEA AND VOMITING 08/29/15   Glori [name], MD  polyethylene glycol (MIRALAX / GLYCOLAX) packet Take 17 g by mouth daily. 12/15/14   Ida Rogue, MD  promethazine (PHENERGAN) 12.5 MG tablet TAKE ONE TABLET BY MOUTH EVERY 6 HOURS AS NEEDED FOR NAUSEA AND VOMITING 07/28/15   Glori [name], MD  traZODone (DESYREL) 50 MG tablet TAKE ONE TABLET BY  MOUTH AT BEDTIME AS NEEDED FOR SLEEP 09/01/15   [name], MD    REVIEW OF SYSTEMS:  Review of Systems  Constitutional: Negative for fever, chills, weight loss and malaise/fatigue.  HENT: Negative for ear pain, hearing loss and tinnitus.   Eyes: Negative for blurred vision, double vision, pain and redness.  Respiratory: Negative for cough, hemoptysis and shortness of breath.   Cardiovascular: Negative for chest pain, palpitations, orthopnea and leg swelling.  Gastrointestinal: Negative for nausea, vomiting, abdominal pain, diarrhea and constipation.  Genitourinary: Negative for dysuria, frequency and hematuria.  Musculoskeletal: Negative for back pain, joint pain and neck pain.  Skin:       No acne, rash, or lesions  Neurological: Negative for dizziness, tremors, focal weakness and weakness.  Endo/Heme/Allergies: Negative for polydipsia. Does not bruise/bleed easily.  Psychiatric/Behavioral: Negative for depression. The patient is not nervous/anxious and does not have insomnia.      VITAL SIGNS:   Filed Vitals:   09/02/15 2145 09/02/15 2215 09/02/15 2230 09/02/15 2245  BP: 163/63 159/58 168/63 173/61  Pulse: 66 67 63 67  Temp:      TempSrc:      Resp: '14 17 15 18  '$ Height:      Weight:      SpO2: 99% 98% 99% 98%    Wt Readings from Last 3 Encounters:  09/02/15 92.08 kg (203 lb)  09/02/15 92.443 kg (203 lb 12.8 oz)  08/18/15 90.719 kg (200 lb)    PHYSICAL EXAMINATION:  Physical Exam  Vitals reviewed. Constitutional: She is oriented to person, place, and time. She appears well-developed and well-nourished. No distress.  HENT:  Head: Normocephalic and atraumatic.  Dry mucous membranes  Eyes: Conjunctivae and EOM are normal. Pupils are equal, round, and reactive to light. No scleral icterus.  Neck: Normal range of motion. Neck supple. No JVD present. No thyromegaly present.  Cardiovascular: Normal rate, regular rhythm and intact distal pulses.  Exam reveals no gallop and no friction rub.   No murmur heard. Respiratory: Effort normal and breath sounds normal. No respiratory distress. She has no wheezes. She has no rales.  GI: Soft. Bowel sounds are normal. She exhibits no distension. There is no tenderness.  Musculoskeletal: Normal range of motion. She exhibits no edema.  No arthritis, no gout  Lymphadenopathy:    She has no cervical adenopathy.  Neurological: She is alert and oriented to person, place, and time. No cranial nerve deficit.  No dysarthria, no aphasia  Skin: Skin is warm and dry. No rash noted. No erythema.  Psychiatric: She has a normal mood and affect. Her behavior is normal. Judgment and thought content normal.    LABORATORY PANEL:   CBC  Recent Labs Lab 09/02/15 1806  WBC 7.7  HGB 10.3*  HCT 31.0*  PLT 212   ------------------------------------------------------------------------------------------------------------------  Chemistries   Recent Labs Lab 09/02/15 1511 09/02/15 1806  NA 136 136  K 5.3* 5.0  CL 107 108  CO2 27 23  GLUCOSE 159* 142*  BUN 32* 32*  CREATININE 3.33* 3.46*  CALCIUM 8.0* 8.1*  AST 16  --   ALT 10  --   ALKPHOS 58  --   BILITOT 0.4  --     ------------------------------------------------------------------------------------------------------------------  Cardiac Enzymes No results for input(s): TROPONINI in the last 168 hours. ------------------------------------------------------------------------------------------------------------------  RADIOLOGY:  No results found.  EKG:   Orders placed or performed during the hospital encounter of 07/22/15  . ED EKG  . ED EKG  .  EKG 12-Lead  . EKG 12-Lead    IMPRESSION AND PLAN:  Principal Problem:   Acute on chronic kidney failure ([place]) - suspect prerenal component, hold Lasix for now, gentle administration of fluids, recheck labs in the morning. Avoid nephrotoxins. Active Problems:   Orthostatic hypotension - suspect mild dehydration due to her increased Lasix administration. Fluids as above and recheck orthostatics in the morning.   HTN (hypertension) - hold antihypertensives for now, if her blood pressure goes above: Continue his when necessary antihypertensives. Her goal is less than 160/100   Diabetes mellitus, type 2 (HCC) - sliding scale insulin with corresponding glucose checks and carb modified diet   Coronary artery disease - continue home meds   Chronic systolic CHF (congestive heart failure) ([place]) - seems to be stable at this time, continue home meds except for her Lasix.   Hypothyroid - continue home dose thyroid replacement   HLD (hyperlipidemia) - continue home dose statin  All the records are reviewed and case discussed with ED provider. Management plans discussed with the patient and/or family.  DVT PROPHYLAXIS: SubQ heparin  GI PROPHYLAXIS: None  ADMISSION STATUS: Observation  CODE STATUS: Full Code Status History    Date Active Date Inactive Code Status Order ID Comments User Context   07/02/2013  1:27 PM 07/14/2013  3:40 PM Full Code [id number]  Nani Skillern, PA-C Inpatient   06/29/2013  9:53 PM 07/02/2013  1:27 PM Full Code [id number]  Larey Dresser, MD Inpatient      TOTAL TIME TAKING CARE OF THIS PATIENT: 40 minutes.    Tiaja Hagan FIELDING 09/02/2015, 11:59 PM  [facility]  Office  [phone]  CC: Primary care physician; [name], MD

## 2015-09-02 NOTE — ED Notes (Signed)
Orthostatic vital signs Laying BP 186/82 Pulse 68 Sitting BP 191/80 Pulse 69 Standing BP 106/41 Pulse 78

## 2015-09-02 NOTE — ED Provider Notes (Signed)
[facility] Emergency Department Provider Note  ____________________________________________  Time seen: ~1945  I have reviewed the triage vital signs and the nursing notes.   HISTORY  Chief Complaint abnormal labs    History limited by: Not Limited   HPI Jamie Arias is a 60 y.o. female who presents to the emergency department today because of concerns for elevated creatinine found on outpatient blood work. The patient says that she has been having some nausea recently. No abdominal pain or diarrhea. She does state that she does not drink enough. Primary care doctor was concerned for dehydration given nausea and vomiting. Outpatient blood work did show elevated creatinine. Patient was sent to the emergency department for further evaluation and workup.   Past Medical History  Diagnosis Date  . Diabetes mellitus without complication ([place])   . Hypertension   . Hyperlipidemia   . Hypothyroidism   . Diabetic neuropathy ([place])   . Chronic systolic heart failure ([place])     a. Due to ischemic cardiomyopathy. EF as low as 35%, improved to normal s/p CABG; b. echo 07/06/13: EF 55-60%, no RWMAs, mod TR, trivial pericardial effusion not c/w tamponade physiology  . Anemia   . Coronary artery disease     a. NSTEMI 06/2013; b.cath: severe three-vessel CAD w/ EF 30% & mild-mod MR; c. s/p 3 vessel CABG 07/02/13 (LIMA-LAD, SVG-OM, and SVG-RPDA).  . Myocardial infarction ([place]) 2015  . CHF (congestive heart failure) ([place])   . Chronic kidney disease     EFGR 31  . Pulmonary hypertension ([place])   . Pleural effusion 2015    Patient Active Problem List   Diagnosis Date Noted  . Depression 07/27/2015  . Nausea without vomiting 07/07/2015  . Sinusitis 07/07/2015  . Encounter for routine gynecological examination 07/06/2015  . Routine general medical examination at a health care facility 07/06/2015  . Cough 05/25/2015  . Calculus of gallbladder with chronic cholecystitis  without obstruction   . Pre-operative cardiovascular examination 11/30/2014  . Bilateral carotid bruits 11/30/2014  . Low magnesium levels 08/10/2013  . Anemia 08/10/2013  . Constipation 08/10/2013  . Postoperative atrial fibrillation ([place]) 07/30/2013  . Coronary artery disease   . CAD (coronary artery disease) 07/02/2013  . Acute systolic heart failure ([place]) 06/30/2013  . NSTEMI (non-ST elevated myocardial infarction) ([place]) 06/29/2013  . Sleep disorder 05/23/2012  . Hypothyroid 04/27/2012  . HTN (hypertension) 04/27/2012  . HLD (hyperlipidemia) 04/27/2012  . Diabetes mellitus, type 2 ([place]) 04/27/2012  . Neuropathy ([place]) 04/27/2012    Past Surgical History  Procedure Laterality Date  . Cataract extraction Bilateral   . Coronary artery bypass graft N/A 07/02/2013    Procedure: CORONARY ARTERY BYPASS GRAFTING (CABG);  Surgeon: Ivin [name], MD;  Location: [facility];  Service: Open Heart Surgery;  Laterality: N/A;  CABG x three, using left internal mammary artery and right leg greater saphenous vein harvested endoscopically  . Intraoperative transesophageal echocardiogram N/A 07/02/2013    Procedure: INTRAOPERATIVE TRANSESOPHAGEAL ECHOCARDIOGRAM;  Surgeon: Ivin [name], MD;  Location: [place];  Service: Open Heart Surgery;  Laterality: N/A;  . Thoracentesis Left 2015  . Cholecystectomy N/A 12/09/2014    Procedure: LAPAROSCOPIC CHOLECYSTECTOMY;  Surgeon: Marlyce [name], MD;  Location: [facility];  Service: General;  Laterality: N/A;    Current Outpatient Rx  Name  Route  Sig  Dispense  Refill  . amLODipine (NORVASC) 5 MG tablet   Oral   Take 0.5 tablets (2.5 mg total) by mouth daily.  45 tablet   3   . aspirin EC 81 MG tablet   Oral   Take 1 tablet (81 mg total) by mouth daily.   90 tablet   3   . atorvastatin (LIPITOR) 80 MG tablet      TAKE ONE TABLET BY MOUTH ONCE DAILY Patient taking differently: TAKE ONE TABLET BY MOUTH ONCE DAILY IN THE AFTERNOON   90 tablet    3   . bismuth-metronidazole-tetracycline (PYLERA) 140-125-125 MG capsule   Oral   Take 3 capsules by mouth 4 (four) times daily -  before meals and at bedtime.   120 capsule   0   . blood glucose meter kit and supplies KIT      Dispense based on patient and insurance preference. Use up to twice daily. (FOR ICD-10 E11.9)   1 each   0   . carvedilol (COREG) 6.25 MG tablet      TAKE ONE TABLET BY MOUTH TWICE DAILY   180 tablet   3   . fluticasone (FLONASE) 50 MCG/ACT nasal spray   Each Nare   Place 2 sprays into both nostrils daily. Patient not taking: Reported on 08/18/2015   16 g   6   . furosemide (LASIX) 20 MG tablet   Oral   Take 1 tablet (20 mg total) by mouth daily.   30 tablet   6   . furosemide (LASIX) 40 MG tablet      TAKE ONE TABLET BY MOUTH ONCE DAILY *CALL OFFICE AND SCHEDULE APPOINTMENT TO SEE DOCTOR BEFORE ANY FURTHER REFILLS*   30 tablet   6   . glucose blood test strip      Test 4 times daily Dx. E11.9. One Touch ultra strips   100 each   12   . insulin aspart protamine - aspart (NOVOLOG 70/30 MIX) (70-30) 100 UNIT/ML FlexPen   Subcutaneous   Inject 0.08 mLs (8 Units total) into the skin 2 (two) times daily.   15 mL   11   . Insulin Pen Needle 32G X 6 MM MISC      Use with Humalog Kwikpen.   50 each   6   . Insulin Syringe-Needle U-100 (INSULIN SYRINGE .5CC/30GX1/2") 30G X 1/2" 0.5 ML MISC      Use as directed   100 each   5   . levothyroxine (SYNTHROID, LEVOTHROID) 75 MCG tablet   Oral   Take 1 tablet (75 mcg total) by mouth daily.   90 tablet   1   . lisinopril (PRINIVIL,ZESTRIL) 20 MG tablet      TAKE ONE TABLET BY MOUTH ONCE DAILY   30 tablet   2   . metFORMIN (GLUCOPHAGE) 500 MG tablet      TAKE ONE TABLET BY MOUTH TWICE DAILY WITH MEALS   60 tablet   5   . NITROSTAT 0.4 MG SL tablet      DISSOLVE ONE TABLET UNDER THE TONGUE EVERY 5 MINUTES AS NEEDED FOR CHEST PAIN.  DO NOT EXCEED A TOTAL OF 3 DOSES IN 15 MINUTES    25 tablet   1   . omeprazole (PRILOSEC) 40 MG capsule   Oral   Take 1 capsule (40 mg total) by mouth daily.   90 capsule   3   . ondansetron (ZOFRAN) 4 MG tablet      TAKE ONE TABLET BY MOUTH ONCE DAILY AS NEEDED FOR NAUSEA AND VOMITING   10 tablet   0   .  ondansetron (ZOFRAN-ODT) 8 MG disintegrating tablet      DISSOLVE ONE TABLET IN MOUTH EVERY 8 HOURS AS NEEDED FOR NAUSEA AND VOMITING   10 tablet   0   . polyethylene glycol (MIRALAX / GLYCOLAX) packet   Oral   Take 17 g by mouth daily.   30 each   0   . promethazine (PHENERGAN) 12.5 MG tablet      TAKE ONE TABLET BY MOUTH EVERY 6 HOURS AS NEEDED FOR NAUSEA AND VOMITING   30 tablet   0   . traZODone (DESYREL) 50 MG tablet      TAKE ONE TABLET BY MOUTH AT BEDTIME AS NEEDED FOR SLEEP   30 tablet   0     Allergies Review of patient's allergies indicates no known allergies.  Family History  Problem Relation Age of Onset  . COPD Mother   . Cancer Mother     Lung  . Pulmonary embolism Father   . Diabetes Paternal Grandfather   . Diabetes Father   . Heart disease Maternal Grandmother   . Colon cancer Neg Hx   . Colon polyps Neg Hx   . Esophageal cancer Neg Hx   . Pancreatic cancer Neg Hx   . Liver disease Neg Hx     Social History Social History  Substance Use Topics  . Smoking status: Never Smoker   . Smokeless tobacco: Never Used  . Alcohol Use: No    Review of Systems  Constitutional: Negative for fever. Cardiovascular: Negative for chest pain. Respiratory: Negative for shortness of breath. Gastrointestinal: Negative for abdominal pain. Positive for nausea. Neurological: Negative for headaches, focal weakness or numbness.  10-point ROS otherwise negative.  ____________________________________________   PHYSICAL EXAM:  VITAL SIGNS: ED Triage Vitals  Enc Vitals Group     BP 09/02/15 1800 173/101 mmHg     Pulse Rate 09/02/15 1800 63     Resp 09/02/15 1800 18     Temp 09/02/15 1800  97.7 F (36.5 C)     Temp Source 09/02/15 1800 Oral     SpO2 09/02/15 1800 100 %     Weight 09/02/15 1800 203 lb (92.08 kg)     Height 09/02/15 1800 _0  (1.626 m)     Head Cir --      Peak Flow --      Pain Score 09/02/15 1801 0   Constitutional: Alert and oriented. Well appearing and in no distress. Eyes: Conjunctivae are normal. PERRL. Normal extraocular movements. ENT   Head: Normocephalic and atraumatic.   Nose: No congestion/rhinnorhea.   Mouth/Throat: Mucous membranes are moist.   Neck: No stridor. Hematological/Lymphatic/Immunilogical: No cervical lymphadenopathy. Cardiovascular: Normal rate, regular rhythm.  No murmurs, rubs, or gallops. Respiratory: Normal respiratory effort without tachypnea nor retractions. Breath sounds are clear and equal bilaterally. No wheezes/rales/rhonchi. Gastrointestinal: Soft and nontender. No distention. Genitourinary: Deferred Musculoskeletal: Normal range of motion in all extremities. No joint effusions.  No lower extremity tenderness nor edema. Neurologic:  Normal speech and language. No gross focal neurologic deficits are appreciated.  Skin:  Skin is warm, dry and intact. No rash noted. Psychiatric: Mood and affect are normal. Speech and behavior are normal. Patient exhibits appropriate insight and judgment.  ____________________________________________   LABS (pertinent positives/negatives)  Labs Reviewed  CBC WITH DIFFERENTIAL/PLATELET - Abnormal; Notable for the following:    RBC 3.55 (*)    Hemoglobin 10.3 (*)    HCT 31.0 (*)    RDW 17.2 (*)  All other components within normal limits  BASIC METABOLIC PANEL - Abnormal; Notable for the following:    Glucose, Bld 142 (*)    BUN 32 (*)    Creatinine, Ser 3.46 (*)    Calcium 8.1 (*)    GFR calc non Af Amer 13 (*)    GFR calc Af Amer 15 (*)    All other components within normal limits      ____________________________________________   EKG  None  ____________________________________________    RADIOLOGY  None  ____________________________________________   PROCEDURES  Procedure(s) performed: None  Critical Care performed: No  ____________________________________________   INITIAL IMPRESSION / ASSESSMENT AND PLAN / ED COURSE  Pertinent labs & imaging results that were available during my care of the patient were reviewed by me and considered in my medical decision making (see chart for details).  Patient presented to the emergency department today because of concerns for dehydration and elevated creatinine found on a passion labs. Patient's attending is roughly one point high or than baseline. Patient was given an initial liter of fluids and orthostatics were checked. She was still orthostatic. Given history of heart failure a second liter of fluid was given however a bit slower. She did not have any trouble breathing or orthostatic hypertension still persisted. Given the fact that it persists after 2 L will plan admission to [facility] service for further workup and evaluation.  ____________________________________________   FINAL CLINICAL IMPRESSION(S) / ED DIAGNOSES  Final diagnoses:  Elevated serum creatinine  Orthostatic hypotension     Note: This dictation was prepared with Dragon dictation. Any transcriptional errors that result from this process are unintentional    Nance Pear, MD 09/03/15 [phone]

## 2015-09-02 NOTE — Progress Notes (Signed)
Pre visit review using our clinic review tool, if applicable. No additional management support is needed unless otherwise documented below in the visit note. 

## 2015-09-02 NOTE — Assessment & Plan Note (Signed)
Still feels mildly depressed regarding her current situation. Offered medication again though she declined. Wants to continue to wait it out. Given return precautions.

## 2015-09-02 NOTE — Assessment & Plan Note (Signed)
Fasting sugars are well controlled. Last A1c was much improved. She'll continue her current regimen and continue to monitor.

## 2015-09-02 NOTE — Telephone Encounter (Signed)
Spoke with patient and advised of worsened renal function. Creatinine up about a point. Has been unable to drink very much liquid related to her nausea. Increased creatinine likely related to dehydration. Discussed that she needs to go to the emergency room for evaluation and likely IV fluids. She voiced understanding. I called the charge nurse at [facility] and informed them that the patient was on her way.

## 2015-09-02 NOTE — Progress Notes (Signed)
[name], female   DOB: Oct 23, 1955, 60 y.o.   MRN: [id number]  [name], MD Phone: [phone]  Jamie Arias is a 60 y.o. female who presents today for follow-up.  Nausea: Patient underwent EGD recently found H. pylori infection. She's been on antibiotics and PPI. Notes continued nausea. No vomiting or diarrhea. No abdominal pain. No blood in her stool. So taking Zofran. Has follow-up with GI in August.  Notes she still feels depressed to some extent. Feels this is because she is sick. Has issues working because she is sick and this makes her depressed as well. Does not want to start on any medications for this. No SI.  HYPERTENSION Disease Monitoring Home BP Monitoring 130-150/60-70 Chest pain- no    Dyspnea- no Medications Compliance-  taking amlodipine, carvedilol, Lasix, and lisinopril. Lightheadedness-  yes, only with standing over the last several days. Resolves with sitting down  Edema- minimal in her feet, no orthopnea or PND  DIABETES Disease Monitoring: Blood Sugar ranges-fasting in the 120s, has been as high as 220s at other times Polyuria/phagia/dipsia- no       Medications: Compliance- insulin 70/30 Hypoglycemic symptoms- rare, eats something and improves   PMH: nonsmoker.   ROS see history of present illness  Objective  Physical Exam Filed Vitals:   09/02/15 1429  BP: 138/72  Pulse: 69  Temp: 98.5 F (36.9 C)    BP Readings from Last 3 Encounters:  09/02/15 138/72  08/18/15 177/86  08/11/15 188/86   Wt Readings from Last 3 Encounters:  09/02/15 203 lb 12.8 oz (92.443 kg)  08/18/15 200 lb (90.719 kg)  08/11/15 200 lb (90.719 kg)   Laying blood pressure 140/88 pulse 66 Sitting blood pressure 150/82 pulse 65 Standing blood pressure 112/60 pulse 70  Physical Exam  Constitutional: No distress.  HENT:  Head: Normocephalic and atraumatic.  Right Ear: External ear normal.  Left Ear: External ear normal.  Cardiovascular:  Normal rate, regular rhythm and normal heart sounds.   Pulmonary/Chest: Effort normal and breath sounds normal.  Abdominal: Soft. Bowel sounds are normal. She exhibits no distension. There is no tenderness. There is no rebound and no guarding.  Neurological: She is alert. Gait normal.  Skin: Skin is warm and dry. She is not diaphoretic.  Psychiatric:  Mood depressed, affect depressed   Diabetic Foot Exam - Simple   Simple Foot Form  Diabetic Foot exam was performed with the following findings:  Yes 09/02/2015  2:50 PM  Visual Inspection  No deformities, no ulcerations, no other skin breakdown bilaterally:  Yes  Sensation Testing  See comments:  Yes  Pulse Check  See comments:  Yes  Comments  Intact to light touch, decreased monofilament bilaterally. DP pulses intact, difficult to appreciate PT pulse with mild edema in feet       Assessment/Plan: Please see individual problem list.  Nausea without vomiting Patient with continued issues with this. Likely related to H. pylori infection. She'll complete treatment for this and follow-up with GI. Continue Zofran as needed. Given return precautions.  HTN (hypertension) Close to goal today though is orthostatic. Could be related to the added amlodipine. We will cut amlodipine in half and she'll continue to monitor her blood pressures and lightheadedness. If persists she will let us know and may need to stop the amlodipine. Suspect the swelling in her legs is also related to amlodipine. We'll check a CBC and renal function to ensure no significant changes in these things to  lead to edema.  Diabetes mellitus, type 2 Fasting sugars are well controlled. Last A1c was much improved. She'll continue her current regimen and continue to monitor.  Depression Still feels mildly depressed regarding her current situation. Offered medication again though she declined. Wants to continue to wait it out. Given return precautions.    Orders Placed This  Encounter  Procedures  . CBC  . Comp Met (CMET)    Meds ordered this encounter  Medications  . amLODipine (NORVASC) 5 MG tablet    Sig: Take 0.5 tablets (2.5 mg total) by mouth daily.    Dispense:  45 tablet    Refill:  [place], MD [place]

## 2015-09-02 NOTE — ED Notes (Signed)
Seen by PCP (Dr. Caryl Bis)  earlier today and had blood work done.  Creatnine was elevated and sent patient to ED for IV fluids.

## 2015-09-02 NOTE — ED Notes (Signed)
Orthostatic vital signs Laying BP 186/77 Pulse 67 Sitting BP 191/77 Pulse 65 Standing BP 160/68 Pulse 69

## 2015-09-02 NOTE — Assessment & Plan Note (Signed)
Close to goal today though is orthostatic. Could be related to the added amlodipine. We will cut amlodipine in half and she'll continue to monitor her blood pressures and lightheadedness. If persists she will let us know and may need to stop the amlodipine. Suspect the swelling in her legs is also related to amlodipine. We'll check a CBC and renal function to ensure no significant changes in these things to lead to edema.

## 2015-09-02 NOTE — ED Notes (Signed)
Patient presents to the ED at the instruction of her PCP. Patient does not have any complaints at this time and states 'i feel fine..Internal Medicine here because my doctor told me to". Patient is noted to have elevated BUN/CRT while on diuretics

## 2015-09-03 LAB — GLUCOSE, CAPILLARY
Glucose-Capillary: 138 mg/dL — ABNORMAL HIGH (ref 65–99)
Glucose-Capillary: 63 mg/dL — ABNORMAL LOW (ref 65–99)
Glucose-Capillary: 69 mg/dL (ref 65–99)
Glucose-Capillary: 96 mg/dL (ref 65–99)
Glucose-Capillary: 122 mg/dL — ABNORMAL HIGH (ref 65–99)

## 2015-09-03 LAB — BASIC METABOLIC PANEL
Anion gap: 3 — ABNORMAL LOW (ref 5–15)
BUN: 32 mg/dL — ABNORMAL HIGH (ref 6–20)
CO2: 24 mmol/L (ref 22–32)
Calcium: 7.9 mg/dL — ABNORMAL LOW (ref 8.9–10.3)
Chloride: 112 mmol/L — ABNORMAL HIGH (ref 101–111)
Creatinine, Ser: 3.19 mg/dL — ABNORMAL HIGH (ref 0.44–1.00)
GFR calc Af Amer: 17 mL/min — ABNORMAL LOW (ref >60)
GFR calc non Af Amer: 15 mL/min — ABNORMAL LOW (ref >60)
Glucose, Bld: 107 mg/dL — ABNORMAL HIGH (ref 65–99)
Potassium: 5.2 mmol/L — ABNORMAL HIGH (ref 3.5–5.1)
Sodium: 139 mmol/L (ref 135–145)

## 2015-09-03 LAB — CBC
HCT: 27.9 % — ABNORMAL LOW (ref 35.0–47.0)
Hemoglobin: 9.6 g/dL — ABNORMAL LOW (ref 12.0–16.0)
MCH: 29.9 pg (ref 26.0–34.0)
MCHC: 34.6 g/dL (ref 32.0–36.0)
MCV: 86.2 fL (ref 80.0–100.0)
Platelets: 192 10*3/uL (ref 150–440)
RBC: 3.23 MIL/uL — ABNORMAL LOW (ref 3.80–5.20)
RDW: 17.1 % — ABNORMAL HIGH (ref 11.5–14.5)
WBC: 7.9 10*3/uL (ref 3.6–11.0)

## 2015-09-03 LAB — HEMOGLOBIN A1C: Hgb A1c MFr Bld: 6.8 % — ABNORMAL HIGH (ref 4.0–6.0)

## 2015-09-03 MED ORDER — ASPIRIN EC 81 MG PO TBEC
81.0000 mg | DELAYED_RELEASE_TABLET | Freq: Every day | ORAL | Status: DC
Start: 2015-09-03 — End: 2015-09-03
  Administered 2015-09-03: 81 mg via ORAL
  Filled 2015-09-03: qty 1

## 2015-09-03 MED ORDER — ACETAMINOPHEN 325 MG PO TABS: 650.0000 mg | ORAL_TABLET | Freq: Four times a day (QID) | ORAL | Status: DC | PRN

## 2015-09-03 MED ORDER — AMLODIPINE BESYLATE 5 MG PO TABS
2.5000 mg | ORAL_TABLET | Freq: Every day | ORAL | Status: DC
  Administered 2015-09-03: 2.5 mg via ORAL
  Filled 2015-09-03: qty 1

## 2015-09-03 MED ORDER — ONDANSETRON HCL 4 MG PO TABS: 4.0000 mg | ORAL_TABLET | Freq: Four times a day (QID) | ORAL | Status: DC | PRN

## 2015-09-03 MED ORDER — ONDANSETRON HCL 4 MG/2ML IJ SOLN: 4.0000 mg | Freq: Four times a day (QID) | INTRAMUSCULAR | Status: DC | PRN

## 2015-09-03 MED ORDER — CARVEDILOL 6.25 MG PO TABS
6.2500 mg | ORAL_TABLET | Freq: Two times a day (BID) | ORAL | Status: DC
  Administered 2015-09-03: 6.25 mg via ORAL
  Filled 2015-09-03: qty 1

## 2015-09-03 MED ORDER — LEVOTHYROXINE SODIUM 75 MCG PO TABS
75.0000 ug | ORAL_TABLET | Freq: Every day | ORAL | Status: DC
  Administered 2015-09-03: 75 ug via ORAL
  Filled 2015-09-03: qty 1

## 2015-09-03 MED ORDER — ACETAMINOPHEN 650 MG RE SUPP: 650.0000 mg | Freq: Four times a day (QID) | RECTAL | Status: DC | PRN

## 2015-09-03 MED ORDER — INSULIN ASPART 100 UNIT/ML ~~LOC~~ SOLN
0.0000 [IU] | Freq: Three times a day (TID) | SUBCUTANEOUS | Status: DC
  Administered 2015-09-03: 1 [IU] via SUBCUTANEOUS
  Filled 2015-09-03: qty 1

## 2015-09-03 MED ORDER — PANTOPRAZOLE SODIUM 40 MG PO TBEC
40.0000 mg | DELAYED_RELEASE_TABLET | Freq: Every day | ORAL | Status: DC
  Administered 2015-09-03: 40 mg via ORAL
  Filled 2015-09-03: qty 1

## 2015-09-03 MED ORDER — HEPARIN SODIUM (PORCINE) 5000 UNIT/ML IJ SOLN
5000.0000 [IU] | Freq: Three times a day (TID) | INTRAMUSCULAR | Status: DC
  Administered 2015-09-03: 5000 [IU] via SUBCUTANEOUS
  Filled 2015-09-03: qty 1

## 2015-09-03 MED ORDER — HYDRALAZINE HCL 25 MG PO TABS
25.0000 mg | ORAL_TABLET | Freq: Three times a day (TID) | ORAL | Status: DC
  Administered 2015-09-03: 25 mg via ORAL
  Filled 2015-09-03: qty 1

## 2015-09-03 MED ORDER — SODIUM CHLORIDE 0.9 % IV SOLN
INTRAVENOUS | Status: DC
  Administered 2015-09-03: 02:00:00 via INTRAVENOUS

## 2015-09-03 MED ORDER — HYDRALAZINE HCL 20 MG/ML IJ SOLN: 10.0000 mg | INTRAMUSCULAR | Status: DC | PRN

## 2015-09-03 MED ORDER — TRAZODONE HCL 50 MG PO TABS: 50.0000 mg | ORAL_TABLET | Freq: Every evening | ORAL | Status: DC | PRN

## 2015-09-03 MED ORDER — ATORVASTATIN CALCIUM 20 MG PO TABS: 80.0000 mg | ORAL_TABLET | Freq: Every day | ORAL | Status: DC

## 2015-09-03 MED ORDER — HYDRALAZINE HCL 25 MG PO TABS: 25.0000 mg | ORAL_TABLET | Freq: Three times a day (TID) | ORAL | Status: DC

## 2015-09-03 MED ORDER — INSULIN ASPART 100 UNIT/ML ~~LOC~~ SOLN: 0.0000 [IU] | Freq: Every day | SUBCUTANEOUS | Status: DC

## 2015-09-03 NOTE — Progress Notes (Signed)
PT [place]. I/S TO DRINK PLENTY OF FLUIDS TO ASSURE HYDRATED,HOLD METFORMIN AND LISINOPRIL UNTIL SHE SEES PRIMARY MD. Pecola Lawless VOICED

## 2015-09-03 NOTE — Progress Notes (Signed)
PT TO DISCHARGE HOME PER DR Morgan Hill ON NEW HYDRALAZINE.Marland Kitchen PT I/S NOT TO TAKE LISINOPRIL AND METFORMIN AND ASSURE ADEQUATE FLUIDS . UNDERSTANDING VOICED. LEFT VIA W/C

## 2015-09-03 NOTE — Discharge Summary (Signed)
Jamie Arias, is a 60 y.o. female  DOB 1955-04-22  MRN [id number].  Admission date:  09/02/2015  Admitting Physician  Lance Coon, MD  Discharge Date:  09/03/2015   Primary MD  [name], MD  Recommendations for primary care physician for things to follow:  Follow up with Dr.Arida  In one week   Admission Diagnosis  Orthostatic hypotension [I95.1] Elevated serum creatinine [R74.8]   Discharge Diagnosis  Orthostatic hypotension [I95.1] Elevated serum creatinine [R74.8]    Principal Problem:   Acute on chronic kidney failure (HCC) Active Problems:   Hypothyroid   HTN (hypertension)   HLD (hyperlipidemia)   Diabetes mellitus, type 2 (HCC)   Coronary artery disease   Orthostatic hypotension   Chronic systolic CHF (congestive heart failure) ([place])      Past Medical History  Diagnosis Date  . Diabetes mellitus without complication ([place])   . Hypertension   . Hyperlipidemia   . Hypothyroidism   . Diabetic neuropathy ([place])   . Chronic systolic heart failure ([place])     a. Due to ischemic cardiomyopathy. EF as low as 35%, improved to normal s/p CABG; b. echo 07/06/13: EF 55-60%, no RWMAs, mod TR, trivial pericardial effusion not c/w tamponade physiology  . Anemia   . Coronary artery disease     a. NSTEMI 06/2013; b.cath: severe three-vessel CAD w/ EF 30% & mild-mod MR; c. s/p 3 vessel CABG 07/02/13 (LIMA-LAD, SVG-OM, and SVG-RPDA).  . Myocardial infarction ([place]) 2015  . CHF (congestive heart failure) ([place])   . Chronic kidney disease     EFGR 31  . Pulmonary hypertension ([place])   . Pleural effusion 2015    Past Surgical History  Procedure Laterality Date  . Cataract extraction Bilateral   . Coronary artery bypass graft N/A 07/02/2013    Procedure: CORONARY ARTERY BYPASS GRAFTING (CABG);  Surgeon: Ivin [name],  MD;  Location: [facility];  Service: Open Heart Surgery;  Laterality: N/A;  CABG x three, using left internal mammary artery and right leg greater saphenous vein harvested endoscopically  . Intraoperative transesophageal echocardiogram N/A 07/02/2013    Procedure: INTRAOPERATIVE TRANSESOPHAGEAL ECHOCARDIOGRAM;  Surgeon: Ivin [name], MD;  Location: [facility];  Service: Open Heart Surgery;  Laterality: N/A;  . Thoracentesis Left 2015  . Cholecystectomy N/A 12/09/2014    Procedure: LAPAROSCOPIC CHOLECYSTECTOMY;  Surgeon: Marlyce [name], MD;  Location: [facility];  Service: General;  Laterality: N/A;       History of present illness and  Hospital Course:     Kindly see H&P for history of present illness and admission details, please review complete Labs, Consult reports and Test reports for all details in brief  HPI  from the history and physical done on the day of admission 60 year old female patient sent in by primary doctor because of abnormal labs  With worsening renal function.  And The need for gentle hydration Patient did not have any shortness of breath or chest pain.   Hospital Course  #1 acute on chronic renal failure: Patient is admitted to medical floor, stopped on Lasix, Ace inhibitors. Gentle hydration. Essential renal function improved creatinine 3.46 on admission today is 3.19. Baseline creatinine is around 2.65. And denies any complaints and she  Is eager to go home. Advised her to stay of lasix and lisinopril till she gets rpt blood work for kidney function  On 2-3 days.also stopped metformin due to  worsenign renal failure  2 hypertension: Uncontrolled: Patient medications are not restarted  on admission. I have started the Norvasc, Coreg. Patient is started on hydralazine 25 mg 3 times a day because of elevated blood pressure. Advised the patient to stop the Lasix and lisinopril today she says the primary doctor because of renal failure. Diabetes mellitus type 2: Metformin stop it  because of renal failure. Patient can continue 70/30  Insulin 8u nits twice a day and up titrated for blood glucose response.  #4. Hypothyroidism: continue levothyroxine 75 mg by mouth daily. Discharge  plan discussed with the patient and patient's son, they are in agreement for discharge plan.  Discharge Condition: stable   Follow UP  Follow-up Information    Follow up with Marikay Alar, MD In 1 week.   Specialty:  Family Medicine   Contact information:   [address] [place] [address] [phone]       Follow up with Lorine Bears, MD.   Specialty:  Cardiology   Contact information:   [address] [place] [address] [phone]         Discharge Instructions  and  Discharge Medications  Stop lasix Start hydralazine 25 mg po TID.     Medication List    STOP taking these medications        furosemide 40 MG tablet  Commonly known as:  LASIX     metFORMIN 500 MG tablet  Commonly known as:  GLUCOPHAGE      TAKE these medications        amLODipine 5 MG tablet  Commonly known as:  NORVASC  Take 0.5 tablets (2.5 mg total) by mouth daily.     aspirin EC 81 MG tablet  Take 1 tablet (81 mg total) by mouth daily.     atorvastatin 80 MG tablet  Commonly known as:  LIPITOR  TAKE ONE TABLET BY MOUTH ONCE DAILY     bismuth-metronidazole-tetracycline 140-125-125 MG capsule  Commonly known as:  PYLERA  Take 3 capsules by mouth 4 (four) times daily -  before meals and at bedtime.     blood glucose meter kit and supplies Kit  Dispense based on patient and insurance preference. Use up to twice daily. (FOR ICD-10 E11.9)     carvedilol 6.25 MG tablet  Commonly known as:  COREG  TAKE ONE TABLET BY MOUTH TWICE DAILY     glucose blood test strip  Test 4 times daily Dx. E11.9. One Touch ultra strips     hydrALAZINE 25 MG tablet  Commonly known as:  APRESOLINE  Take 1 tablet (25 mg total) by mouth 3 (three) times daily.      insulin aspart protamine - aspart (70-30) 100 UNIT/ML FlexPen  Commonly known as:  NOVOLOG 70/30 MIX  Inject 0.08 mLs (8 Units total) into the skin 2 (two) times daily.     levothyroxine 75 MCG tablet  Commonly known as:  SYNTHROID, LEVOTHROID  Take 1 tablet (75 mcg total) by mouth daily.     lisinopril 20 MG tablet  Commonly known as:  PRINIVIL,ZESTRIL  TAKE ONE TABLET BY MOUTH ONCE DAILY     NITROSTAT 0.4 MG SL tablet  Generic drug:  nitroGLYCERIN  DISSOLVE ONE TABLET UNDER THE TONGUE EVERY 5 MINUTES AS NEEDED FOR CHEST PAIN.  DO NOT EXCEED A TOTAL OF 3 DOSES IN 15 MINUTES     omeprazole 40 MG capsule  Commonly known as:  PRILOSEC  Take 1 capsule (40 mg total) by mouth daily.     ondansetron  4 MG tablet  Commonly known as:  ZOFRAN  TAKE ONE TABLET BY MOUTH ONCE DAILY AS NEEDED FOR NAUSEA AND VOMITING     ondansetron 8 MG disintegrating tablet  Commonly known as:  ZOFRAN-ODT  DISSOLVE ONE TABLET IN MOUTH EVERY 8 HOURS AS NEEDED FOR NAUSEA AND VOMITING     promethazine 12.5 MG tablet  Commonly known as:  PHENERGAN  TAKE ONE TABLET BY MOUTH EVERY 6 HOURS AS NEEDED FOR NAUSEA AND VOMITING     traZODone 50 MG tablet  Commonly known as:  DESYREL  TAKE ONE TABLET BY MOUTH AT BEDTIME AS NEEDED FOR SLEEP          Diet and Activity recommendation: See Discharge Instructions above   Consults obtained -none   Major procedures and Radiology Reports - PLEASE review detailed and final reports for all details, in brief -     Nm Gastric Emptying  08/19/2015  CLINICAL DATA:  Nausea. EXAM: NUCLEAR MEDICINE GASTRIC EMPTYING SCAN TECHNIQUE: After oral ingestion of radiolabeled meal, sequential abdominal images were obtained for 120 minutes. Residual percentage of activity remaining within the stomach was calculated at 60 and 120 minutes. RADIOPHARMACEUTICALS:  1.95 mCi [id number] labeled sulfur colloid orally COMPARISON:  None. FINDINGS: Expected location of the stomach in the left  upper quadrant. Ingested meal empties the stomach gradually over the course of the study with 51.4% retention at 60 min and 26.7% retention at 90 min (normal retention less than 30% at a 120 min). IMPRESSION: Normal gastric emptying study. Electronically Signed   By: TMarcello Moores Register   On: 08/19/2015 10:06    Micro Results    No results found for this or any previous visit (from the past 240 hour(s)).     Today   Subjective:   Jamie Arias has no headache,no chest abdominal pain,no new weakness tingling or numbness, feels much better wants to go home today.  Objective:   Blood pressure 150/64, pulse 67, temperature 98.2 F (36.8 C), temperature source Oral, resp. rate 20, height '5\' 4"'$  (1.626 m), weight 94.363 kg (208 lb 0.5 oz), SpO2 99 %.   Intake/Output Summary (Last 24 hours) at 09/03/15 1128 Last data filed at 09/03/15 0908  Gross per 24 hour  Intake 481.67 ml  Output      2 ml  Net 479.67 ml    Exam Awake Alert, Oriented x 3, No new F.N deficits, Normal affect Jamie Arias.AT,PERRAL Supple Neck,No JVD, No cervical lymphadenopathy appriciated.  Symmetrical Chest wall movement, Good air movement bilaterally, CTAB RRR,No Gallops,Rubs or new Murmurs, No Parasternal Heave +ve B.Sounds, Abd Soft, Non tender, No organomegaly appriciated, No rebound -guarding or rigidity. No Cyanosis, Clubbing or edema, No new Rash or bruise  Data Review   CBC w Diff: Lab Results  Component Value Date   WBC 7.9 09/03/2015   WBC 5.0 07/23/2013   HGB 9.6* 09/03/2015   HGB 10.5* 07/23/2013   HCT 27.9* 09/03/2015   HCT 31.6* 07/23/2013   PLT 192 09/03/2015   PLT 275 07/23/2013   LYMPHOPCT 23 09/02/2015   LYMPHOPCT 15.2 07/19/2013   MONOPCT 7 09/02/2015   MONOPCT 7.0 07/19/2013   EOSPCT 6 09/02/2015   EOSPCT 0.2 07/19/2013   BASOPCT 1 09/02/2015   BASOPCT 0.7 07/19/2013    CMP: Lab Results  Component Value Date   NA 139 09/03/2015   NA 140 03/30/2014   NA 140 03/30/2014   K 5.2*  09/03/2015   K 4.3 03/30/2014  CL 112* 09/03/2015   CL 105 03/30/2014   CO2 24 09/03/2015   CO2 29 03/30/2014   BUN 32* 09/03/2015   BUN 21* 03/30/2014   BUN 21 03/30/2014   CREATININE 3.19* 09/03/2015   CREATININE 1.55* 03/30/2014   CREATININE 1.6* 03/30/2014   GLU 187 03/30/2014   PROT 5.1* 09/02/2015   PROT 6.4 12/18/2013   PROT 7.0 07/23/2013   ALBUMIN 2.2* 09/02/2015   ALBUMIN 3.8 12/18/2013   ALBUMIN 2.7* 07/23/2013   BILITOT 0.4 09/02/2015   BILITOT 0.7 07/23/2013   ALKPHOS 58 09/02/2015   ALKPHOS 145* 07/23/2013   AST 16 09/02/2015   AST 16 07/23/2013   ALT 10 09/02/2015   ALT 15 07/23/2013  .   Total Time in preparing paper work, data evaluation and todays exam - 26 minutes  Jamie Arias M.D on 09/03/2015 at 11:28 AM    Note: This dictation was prepared with Dragon dictation along with smaller phrase technology. Any transcriptional errors that result from this process are unintentional.

## 2015-09-05 ENCOUNTER — Telehealth: Payer: Self-pay | Admitting: Cardiovascular Disease

## 2015-09-05 NOTE — Telephone Encounter (Signed)
Pt spouse calling stating last Friday pt went into hospital for BP issues Kept her over night Pt was told that she needed an appointment soon. She has some questions about her medication, they took her off some of the medications and put her on some new ones. Please advise if patient need to come in and also pt would like to go over her medications.   Pt is unsure if patient needs to come in to see [place].  Please advise.

## 2015-09-05 NOTE — Telephone Encounter (Addendum)
Reviewed with patient's spouse  Patient contacted regarding discharge from [facility]  on July 1.  Patient understands to follow up with provider Dr. Fletcher Anon July 13 at [place] at [facility] [place]. Patient understands discharge instructions? yes Patient understands medications and regiment? yes Patient understands to bring all medications to this visit? yes  S/w pt spouse, Jamie Arias, who reports pt d/c'd from hospital July 1. Medications were adjusted. Needs f/u w/Dr. Fletcher Anon. He asks when pt should resume lasix. Reviewed current medications and advised to have pt take all medications as directed w/ f/u appt July 13, 10am. Also advised to monitor BP and weight daily, keep a log and bring to next appt. He verbalized understanding and is agreeable w/plan.

## 2015-09-09 ENCOUNTER — Encounter: Payer: Self-pay | Admitting: Family Medicine

## 2015-09-09 ENCOUNTER — Ambulatory Visit (INDEPENDENT_AMBULATORY_CARE_PROVIDER_SITE_OTHER): Payer: BLUE CROSS/BLUE SHIELD | Admitting: Family Medicine

## 2015-09-09 VITALS — BP 154/88 | HR 70 | Temp 98.4°F | Ht 64.0 in | Wt 215.8 lb

## 2015-09-09 DIAGNOSIS — N179 Acute kidney failure, unspecified: Secondary | ICD-10-CM

## 2015-09-09 DIAGNOSIS — I10 Essential (primary) hypertension: Secondary | ICD-10-CM

## 2015-09-09 DIAGNOSIS — N189 Chronic kidney disease, unspecified: Secondary | ICD-10-CM | POA: Diagnosis not present

## 2015-09-09 MED ORDER — FUROSEMIDE 20 MG PO TABS: 20.0000 mg | ORAL_TABLET | Freq: Every day | ORAL | Status: DC

## 2015-09-09 NOTE — Progress Notes (Signed)
Patient ID: LORAIN FETTES, female   DOB: [birth year], 60 y.o.   MRN: [id number]  [name], MD Phone: [phone]  Jamie Arias is a 60 y.o. female who presents today for follow-up.  Patient seen about a week ago and noted to have increase in her creatinine and sent to the hospital. She was admitted. They discontinued her Lasix, metformin, and lisinopril. She notes since then her weight has been up about 12 pounds. Her blood pressures at home have been in the 130s over 60s to 70s. She denies chest pain, shortness breath, orthopnea, and PND. No headache, vision changes, numbness, or weakness. Has been mildly lightheaded though this is improved. She's been eating and drinking significantly better since being treated for H. pylori. She feels well today.  PMH: nonsmoker.   ROS see history of present illness  Objective  Physical Exam Filed Vitals:   09/09/15 1503  BP: 154/88  Pulse: 70  Temp: 98.4 F (36.9 C)    BP Readings from Last 3 Encounters:  09/09/15 154/88  09/03/15 178/84  09/02/15 138/72   Wt Readings from Last 3 Encounters:  09/09/15 215 lb 12.8 oz (97.886 kg)  09/03/15 208 lb 0.5 oz (94.363 kg)  09/02/15 203 lb 12.8 oz (92.443 kg)    Physical Exam  Constitutional: No distress.  HENT:  Head: Normocephalic and atraumatic.  Right Ear: External ear normal.  Left Ear: External ear normal.  Cardiovascular: Normal rate, regular rhythm and normal heart sounds.   Trace lower extremity edema  Pulmonary/Chest: Effort normal and breath sounds normal.  Neurological: She is alert. Gait normal.  Skin: Skin is warm and dry. She is not diaphoretic.     Assessment/Plan: Please see individual problem list.  Acute on chronic kidney failure [facility]) Patient presents in follow-up for acute on chronic kidney failure. Hospitalized and had downtrending creatinine at discharge. She had her metformin, lisinopril, and Lasix discontinued. Has had increase in weight of about 12  pounds over the last week since Lasix was discontinued. She is otherwise asymptomatic. She feels well today. Given weight increase and history of CHF we will start her back on a low-dose of Lasix. 20 mg of Lasix was sent to her pharmacy. They reported that they may have had 40 mg tablets at home and I advised that if this was the case they can break 40 mg in half. We will recheck her renal function today. She will need renal function rechecked in 1 week as well. She has follow-up with cardiology and I have asked that she requests this to be done when she follows up with them. She will continue to monitor her blood pressure at home. Slightly higher in the office today than at home. She is given return precautions.    Orders Placed This Encounter  Procedures  . Comp Met (CMET)    Meds ordered this encounter  Medications  . furosemide (LASIX) 20 MG tablet    Sig: Take 1 tablet (20 mg total) by mouth daily.    Dispense:  30 tablet    Refill:  0    [name], MD [place]

## 2015-09-09 NOTE — Assessment & Plan Note (Signed)
Patient presents in follow-up for acute on chronic kidney failure. Hospitalized and had downtrending creatinine at discharge. She had her metformin, lisinopril, and Lasix discontinued. Has had increase in weight of about 12 pounds over the last week since Lasix was discontinued. She is otherwise asymptomatic. She feels well today. Given weight increase and history of CHF we will start her back on a low-dose of Lasix. 20 mg of Lasix was sent to her pharmacy. They reported that they may have had 40 mg tablets at home and I advised that if this was the case they can break 40 mg in half. We will recheck her renal function today. She will need renal function rechecked in 1 week as well. She has follow-up with cardiology and I have asked that she requests this to be done when she follows up with them. She will continue to monitor her blood pressure at home. Slightly higher in the office today than at home. She is given return precautions.

## 2015-09-09 NOTE — Progress Notes (Signed)
Pre visit review using our clinic review tool, if applicable. No additional management support is needed unless otherwise documented below in the visit note. 

## 2015-09-09 NOTE — Patient Instructions (Signed)
Nice to see you. I am glad you are doing better. We are going to start you on a low dose of Lasix to help with your volume. You should continue to eat and drink well. We will recheck your kidney function as well. If you develop chest pain, shortness of breath, trouble breathing when lying flat, worsening lightheadedness, or any new or changing symptoms please seek medical attention.

## 2015-09-10 ENCOUNTER — Emergency Department
Admission: EM | Admit: 2015-09-10 | Discharge: 2015-09-10 | Disposition: A | Discharge status: Home / Self Care | Payer: BLUE CROSS/BLUE SHIELD | Attending: Student | Admitting: Student

## 2015-09-10 ENCOUNTER — Telehealth: Payer: Self-pay | Admitting: Family Medicine

## 2015-09-10 DIAGNOSIS — E039 Hypothyroidism, unspecified: Secondary | ICD-10-CM | POA: Diagnosis not present

## 2015-09-10 DIAGNOSIS — I2510 Atherosclerotic heart disease of native coronary artery without angina pectoris: Secondary | ICD-10-CM | POA: Insufficient documentation

## 2015-09-10 DIAGNOSIS — Z794 Long term (current) use of insulin: Secondary | ICD-10-CM | POA: Insufficient documentation

## 2015-09-10 DIAGNOSIS — E785 Hyperlipidemia, unspecified: Secondary | ICD-10-CM | POA: Diagnosis not present

## 2015-09-10 DIAGNOSIS — R609 Edema, unspecified: Secondary | ICD-10-CM | POA: Diagnosis present

## 2015-09-10 DIAGNOSIS — N189 Chronic kidney disease, unspecified: Secondary | ICD-10-CM | POA: Diagnosis not present

## 2015-09-10 DIAGNOSIS — Z955 Presence of coronary angioplasty implant and graft: Secondary | ICD-10-CM | POA: Diagnosis not present

## 2015-09-10 DIAGNOSIS — Z79899 Other long term (current) drug therapy: Secondary | ICD-10-CM | POA: Diagnosis not present

## 2015-09-10 DIAGNOSIS — I130 Hypertensive heart and chronic kidney disease with heart failure and stage 1 through stage 4 chronic kidney disease, or unspecified chronic kidney disease: Secondary | ICD-10-CM | POA: Insufficient documentation

## 2015-09-10 DIAGNOSIS — E1122 Type 2 diabetes mellitus with diabetic chronic kidney disease: Secondary | ICD-10-CM | POA: Insufficient documentation

## 2015-09-10 DIAGNOSIS — E875 Hyperkalemia: Secondary | ICD-10-CM

## 2015-09-10 DIAGNOSIS — I252 Old myocardial infarction: Secondary | ICD-10-CM | POA: Insufficient documentation

## 2015-09-10 DIAGNOSIS — I5022 Chronic systolic (congestive) heart failure: Secondary | ICD-10-CM | POA: Insufficient documentation

## 2015-09-10 DIAGNOSIS — F329 Major depressive disorder, single episode, unspecified: Secondary | ICD-10-CM | POA: Diagnosis not present

## 2015-09-10 DIAGNOSIS — I10 Essential (primary) hypertension: Secondary | ICD-10-CM

## 2015-09-10 DIAGNOSIS — Z7982 Long term (current) use of aspirin: Secondary | ICD-10-CM | POA: Insufficient documentation

## 2015-09-10 LAB — COMPREHENSIVE METABOLIC PANEL
ALT: 13 U/L (ref 6–29)
ALT: 17 U/L (ref 14–54)
AST: 18 U/L (ref 10–35)
AST: 24 U/L (ref 15–41)
Albumin: 2.3 g/dL — ABNORMAL LOW (ref 3.6–5.1)
Albumin: 2.2 g/dL — ABNORMAL LOW (ref 3.5–5.0)
Alkaline Phosphatase: 58 U/L (ref 33–130)
Alkaline Phosphatase: 57 U/L (ref 38–126)
Anion gap: 6 (ref 5–15)
BUN: 41 mg/dL — ABNORMAL HIGH (ref 7–25)
BUN: 43 mg/dL — ABNORMAL HIGH (ref 6–20)
CO2: 21 mmol/L (ref 20–31)
CO2: 21 mmol/L — ABNORMAL LOW (ref 22–32)
Calcium: 7.7 mg/dL — ABNORMAL LOW (ref 8.6–10.4)
Calcium: 8.2 mg/dL — ABNORMAL LOW (ref 8.9–10.3)
Chloride: 110 mmol/L (ref 98–110)
Chloride: 111 mmol/L (ref 101–111)
Creat: 3.14 mg/dL — ABNORMAL HIGH (ref 0.50–0.99)
Creatinine, Ser: 3.03 mg/dL — ABNORMAL HIGH (ref 0.44–1.00)
GFR calc Af Amer: 18 mL/min — ABNORMAL LOW (ref >60)
GFR calc non Af Amer: 16 mL/min — ABNORMAL LOW (ref >60)
Glucose, Bld: 188 mg/dL — ABNORMAL HIGH (ref 65–99)
Glucose, Bld: 197 mg/dL — ABNORMAL HIGH (ref 65–99)
Potassium: 6 mmol/L — ABNORMAL HIGH (ref 3.5–5.3)
Potassium: 4.6 mmol/L (ref 3.5–5.1)
Sodium: 140 mmol/L (ref 135–146)
Sodium: 138 mmol/L (ref 135–145)
Total Bilirubin: 0.3 mg/dL (ref 0.2–1.2)
Total Bilirubin: 0.6 mg/dL (ref 0.3–1.2)
Total Protein: 4.8 g/dL — ABNORMAL LOW (ref 6.1–8.1)
Total Protein: 5.3 g/dL — ABNORMAL LOW (ref 6.5–8.1)

## 2015-09-10 LAB — CBC WITH DIFFERENTIAL/PLATELET
Basophils Absolute: 0.1 10*3/uL (ref 0–0.1)
Basophils Relative: 2 %
Eosinophils Absolute: 0.4 10*3/uL (ref 0–0.7)
Eosinophils Relative: 6 %
HCT: 27 % — ABNORMAL LOW (ref 35.0–47.0)
Hemoglobin: 9.4 g/dL — ABNORMAL LOW (ref 12.0–16.0)
Lymphocytes Relative: 17 %
Lymphs Abs: 1 10*3/uL (ref 1.0–3.6)
MCH: 30.3 pg (ref 26.0–34.0)
MCHC: 34.7 g/dL (ref 32.0–36.0)
MCV: 87.6 fL (ref 80.0–100.0)
Monocytes Absolute: 0.4 10*3/uL (ref 0.2–0.9)
Monocytes Relative: 8 %
Neutro Abs: 3.8 10*3/uL (ref 1.4–6.5)
Neutrophils Relative %: 67 %
Platelets: 169 10*3/uL (ref 150–440)
RBC: 3.08 MIL/uL — ABNORMAL LOW (ref 3.80–5.20)
RDW: 16.6 % — ABNORMAL HIGH (ref 11.5–14.5)
WBC: 5.7 10*3/uL (ref 3.6–11.0)

## 2015-09-10 MED ORDER — HYDRALAZINE HCL 25 MG PO TABS
25.0000 mg | ORAL_TABLET | Freq: Once | ORAL | Status: AC
  Administered 2015-09-10: 25 mg via ORAL
  Filled 2015-09-10: qty 1

## 2015-09-10 MED ORDER — CARVEDILOL 6.25 MG PO TABS
6.2500 mg | ORAL_TABLET | Freq: Once | ORAL | Status: AC
  Administered 2015-09-10: 6.25 mg via ORAL
  Filled 2015-09-10: qty 1

## 2015-09-10 MED ORDER — SODIUM CHLORIDE 0.9 % IV BOLUS (SEPSIS)
500.0000 mL | Freq: Once | INTRAVENOUS | Status: AC
  Administered 2015-09-10: 500 mL via INTRAVENOUS

## 2015-09-10 MED ORDER — AMLODIPINE BESYLATE 5 MG PO TABS
5.0000 mg | ORAL_TABLET | Freq: Once | ORAL | Status: AC
  Administered 2015-09-10: 5 mg via ORAL
  Filled 2015-09-10: qty 1

## 2015-09-10 NOTE — ED Notes (Signed)
Pt states she had not been feeling well and was recenty dx with H pylori and rx abx for a week, states she had a follow up with PCP yesterday and was called by PCP this morning and referred to the ED for K+6.0 and ^BUN/creatine

## 2015-09-10 NOTE — ED Notes (Signed)
MD Gayle at bedside. 

## 2015-09-10 NOTE — ED Provider Notes (Signed)
 [facility] Emergency Department Provider Note   ____________________________________________  Time seen: Approximately 10:22 AM  I have reviewed the triage vital signs and the nursing notes.   HISTORY  Chief Complaint Abnormal Lab    HPI Jamie Arias is a 59 y.o. female with history of CHF, CAD, hypertension, hyperlipidemia, hypothyroidism, chronic kidney disease who presents at the advice of her primary care doctor for evaluation of hyperkalemia, potassium was drawn yesterday and it was 6.0, she received a phone call this morning to come to the emergency department. Onset gradual, moderate severity, no modifying factors. She was discharged from [facility] on 09/03/2015 after treatment for orthostatic hypotension and acute on chronic kidney disease, her Lasix was discontinued. She had a routine follow-up with her primary care doctor yesterday for repeat evaluation, she reports she has been feeling well. She denies any chest pain or difficulty breathing, no vomiting, diarrhea, fevers or chills. She does have chronic pitting edema of the lower extremities. Her Lasix was restarted by her primary care doctor yesterday and she is now taking 20 mg by mouth daily.   Past Medical History  Diagnosis Date  . Diabetes mellitus without complication (HCC)   . Hypertension   . Hyperlipidemia   . Hypothyroidism   . Diabetic neuropathy (HCC)   . Chronic systolic heart failure (HCC)     a. Due to ischemic cardiomyopathy. EF as low as 35%, improved to normal s/p CABG; b. echo 07/06/13: EF 55-60%, no RWMAs, mod TR, trivial pericardial effusion not c/w tamponade physiology  . Anemia   . Coronary artery disease     a. NSTEMI 06/2013; b.cath: severe three-vessel CAD w/ EF 30% & mild-mod MR; c. s/p 3 vessel CABG 07/02/13 (LIMA-LAD, SVG-OM, and SVG-RPDA).  . Myocardial infarction (HCC) 2015  . CHF (congestive heart failure) (HCC)   . Chronic kidney disease     EFGR 31  . Pulmonary  hypertension (HCC)   . Pleural effusion 2015    Patient Active Problem List   Diagnosis Date Noted  . Acute on chronic kidney failure (HCC) 09/02/2015  . Orthostatic hypotension 09/02/2015  . Chronic systolic CHF (congestive heart failure) (HCC) 09/02/2015  . Depression 07/27/2015  . Nausea without vomiting 07/07/2015  . Sinusitis 07/07/2015  . Encounter for routine gynecological examination 07/06/2015  . Routine general medical examination at a health care facility 07/06/2015  . Cough 05/25/2015  . Calculus of gallbladder with chronic cholecystitis without obstruction   . Pre-operative cardiovascular examination 11/30/2014  . Bilateral carotid bruits 11/30/2014  . Low magnesium levels 08/10/2013  . Anemia 08/10/2013  . Constipation 08/10/2013  . Postoperative atrial fibrillation (HCC) 07/30/2013  . Coronary artery disease   . CAD (coronary artery disease) 07/02/2013  . Acute systolic heart failure (HCC) 06/30/2013  . NSTEMI (non-ST elevated myocardial infarction) (HCC) 06/29/2013  . Sleep disorder 05/23/2012  . Hypothyroid 04/27/2012  . HTN (hypertension) 04/27/2012  . HLD (hyperlipidemia) 04/27/2012  . Diabetes mellitus, type 2 (HCC) 04/27/2012  . Neuropathy (HCC) 04/27/2012    Past Surgical History  Procedure Laterality Date  . Cataract extraction Bilateral   . Coronary artery bypass graft N/A 07/02/2013    Procedure: CORONARY ARTERY BYPASS GRAFTING (CABG);  Surgeon: Peter Van Trigt, MD;  Location: [facility] OR;  Service: Open Heart Surgery;  Laterality: N/A;  CABG x three, using left internal mammary artery and right leg greater saphenous vein harvested endoscopically  . Intraoperative transesophageal echocardiogram N/A 07/02/2013    Procedure: INTRAOPERATIVE TRANSESOPHAGEAL   ECHOCARDIOGRAM;  Surgeon: Peter Van Trigt, MD;  Location: [facility] OR;  Service: Open Heart Surgery;  Laterality: N/A;  . Thoracentesis Left 2015  . Cholecystectomy N/A 12/09/2014    Procedure: LAPAROSCOPIC  CHOLECYSTECTOMY;  Surgeon: Christopher Lundquist, MD;  Location: [facility] ORS;  Service: General;  Laterality: N/A;    Current Outpatient Rx  Name  Route  Sig  Dispense  Refill  . amLODipine (NORVASC) 5 MG tablet   Oral   Take 0.5 tablets (2.5 mg total) by mouth daily.   45 tablet   3   . aspirin EC 81 MG tablet   Oral   Take 1 tablet (81 mg total) by mouth daily.   90 tablet   3   . atorvastatin (LIPITOR) 80 MG tablet      TAKE ONE TABLET BY MOUTH ONCE DAILY Patient taking differently: TAKE ONE TABLET BY MOUTH ONCE DAILY IN THE AFTERNOON   90 tablet   3   . bismuth-metronidazole-tetracycline (PYLERA) 140-125-125 MG capsule   Oral   Take 3 capsules by mouth 4 (four) times daily -  before meals and at bedtime.   120 capsule   0   . blood glucose meter kit and supplies KIT      Dispense based on patient and insurance preference. Use up to twice daily. (FOR ICD-10 E11.9)   1 each   0   . carvedilol (COREG) 6.25 MG tablet      TAKE ONE TABLET BY MOUTH TWICE DAILY   180 tablet   3   . furosemide (LASIX) 20 MG tablet   Oral   Take 1 tablet (20 mg total) by mouth daily.   30 tablet   0   . glucose blood test strip      Test 4 times daily Dx. E11.9. One Touch ultra strips   100 each   12   . hydrALAZINE (APRESOLINE) 25 MG tablet   Oral   Take 1 tablet (25 mg total) by mouth 3 (three) times daily.   30 tablet   0   . insulin aspart protamine - aspart (NOVOLOG 70/30 MIX) (70-30) 100 UNIT/ML FlexPen   Subcutaneous   Inject 0.08 mLs (8 Units total) into the skin 2 (two) times daily.   15 mL   11   . levothyroxine (SYNTHROID, LEVOTHROID) 75 MCG tablet   Oral   Take 1 tablet (75 mcg total) by mouth daily.   90 tablet   1   . NITROSTAT 0.4 MG SL tablet      DISSOLVE ONE TABLET UNDER THE TONGUE EVERY 5 MINUTES AS NEEDED FOR CHEST PAIN.  DO NOT EXCEED A TOTAL OF 3 DOSES IN 15 MINUTES   25 tablet   1   . omeprazole (PRILOSEC) 40 MG capsule   Oral   Take 1  capsule (40 mg total) by mouth daily.   90 capsule   3   . ondansetron (ZOFRAN) 4 MG tablet      TAKE ONE TABLET BY MOUTH ONCE DAILY AS NEEDED FOR NAUSEA AND VOMITING   10 tablet   0   . ondansetron (ZOFRAN-ODT) 8 MG disintegrating tablet      DISSOLVE ONE TABLET IN MOUTH EVERY 8 HOURS AS NEEDED FOR NAUSEA AND VOMITING   10 tablet   0   . promethazine (PHENERGAN) 12.5 MG tablet      TAKE ONE TABLET BY MOUTH EVERY 6 HOURS AS NEEDED FOR NAUSEA AND VOMITING   30 tablet     0   . traZODone (DESYREL) 50 MG tablet      TAKE ONE TABLET BY MOUTH AT BEDTIME AS NEEDED FOR SLEEP   30 tablet   0     Allergies Review of patient's allergies indicates no known allergies.  Family History  Problem Relation Age of Onset  . COPD Mother   . Cancer Mother     Lung  . Pulmonary embolism Father   . Diabetes Paternal Grandfather   . Diabetes Father   . Heart disease Maternal Grandmother   . Colon cancer Neg Hx   . Colon polyps Neg Hx   . Esophageal cancer Neg Hx   . Pancreatic cancer Neg Hx   . Liver disease Neg Hx     Social History Social History  Substance Use Topics  . Smoking status: Never Smoker   . Smokeless tobacco: Never Used  . Alcohol Use: No    Review of Systems Constitutional: No fever/chills Eyes: No visual changes. ENT: No sore throat. Cardiovascular: Denies chest pain. Respiratory: Denies shortness of breath. Gastrointestinal: No abdominal pain.  No nausea, no vomiting.  No diarrhea.  No constipation. Genitourinary: Negative for dysuria. Musculoskeletal: Negative for back pain. Skin: Negative for rash. Neurological: Negative for headaches, focal weakness or numbness.  10-point ROS otherwise negative.  ____________________________________________   PHYSICAL EXAM:  Filed Vitals:   09/10/15 1135 09/10/15 1220 09/10/15 1230 09/10/15 1316  BP: 207/75 195/73 197/67 177/63  Pulse: 74 74 72 74  Temp:      TempSrc:      Resp: 18 18 17 16  Height:        Weight:      SpO2: 96% 97% 97% 98%    VITAL SIGNS: ED Triage Vitals  Enc Vitals Group     BP 09/10/15 1019 199/106 mmHg     Pulse Rate 09/10/15 1019 74     Resp 09/10/15 1019 18     Temp 09/10/15 1019 98.3 F (36.8 C)     Temp Source 09/10/15 1019 Oral     SpO2 09/10/15 1019 99 %     Weight 09/10/15 1019 211 lb (95.709 kg)     Height 09/10/15 1019 5' 4" (1.626 m)     Head Cir --      Peak Flow --      Pain Score 09/10/15 1020 0     Pain Loc --      Pain Edu? --      Excl. in GC? --     Constitutional: Alert and oriented. Well appearing and in no acute distress. Eyes: Conjunctivae are normal. PERRL. EOMI. Head: Atraumatic. Nose: No congestion/rhinnorhea. Mouth/Throat: Mucous membranes are moist.  Oropharynx non-erythematous. Neck: No stridor. Supple without meningismus.  Cardiovascular: Normal rate, regular rhythm. Grossly normal heart sounds.  Good peripheral circulation. Respiratory: Normal respiratory effort.  No retractions. Lungs CTAB. Gastrointestinal: Soft and nontender. No distention. No CVA tenderness. Genitourinary: deferred Musculoskeletal: 1+ pitting edema bilateral lower extremities.  No joint effusions. Neurologic:  Normal speech and language. No gross focal neurologic deficits are appreciated. No gait instability. Skin:  Skin is warm, dry and intact. No rash noted. Psychiatric: Mood and affect are normal. Speech and behavior are normal.  ____________________________________________   LABS (all labs ordered are listed, but only abnormal results are displayed)  Labs Reviewed  CBC WITH DIFFERENTIAL/PLATELET - Abnormal; Notable for the following:    RBC 3.08 (*)    Hemoglobin 9.4 (*)    HCT 27.0 (*)      RDW 16.6 (*)    All other components within normal limits  COMPREHENSIVE METABOLIC PANEL - Abnormal; Notable for the following:    CO2 21 (*)    Glucose, Bld 197 (*)    BUN 43 (*)    Creatinine, Ser 3.03 (*)    Calcium 8.2 (*)    Total Protein 5.3  (*)    Albumin 2.2 (*)    GFR calc non Af Amer 16 (*)    GFR calc Af Amer 18 (*)    All other components within normal limits   ____________________________________________  EKG  ED ECG REPORT I, ,  A, the attending physician, personally viewed and interpreted this ECG.   Date: 09/10/2015  EKG Time: 10:18  Rate: 71  Rhythm: normal sinus rhythm  Axis: borderline right axis  Intervals:none  ST&T Change: No acute ST elevation. No acute hyperkalemic EKG changes.  ____________________________________________  RADIOLOGY  none ____________________________________________   PROCEDURES  Procedure(s) performed: None  Procedures  Critical Care performed: No  ____________________________________________   INITIAL IMPRESSION / ASSESSMENT AND PLAN / ED COURSE  Pertinent labs & imaging results that were available during my care of the patient were reviewed by me and considered in my medical decision making (see chart for details).  Jamie Arias is a 59 y.o. female with history of CHF, CAD, hypertension, hyperlipidemia, hypothyroidism, chronic kidney disease who presents at the advice of her primary care doctor for evaluation of hyperkalemia, potassium was drawn yesterday and it was 6.0. She is completely asymptomatic and has no complaints. Her vital signs are notable for hypertension but she has a history of the same and has not taken her home medications this morning. EKG reassuring not consistent with acute ischemia, no acute hyperkalemic EKG changes, we'll obtain screening labs, we'll provide IV hydration given that I reviewed her potassium and it was 6.0 yesterday. Reassess for disposition.  ----------------------------------------- 2:02 PM on 09/10/2015 ----------------------------------------- Patient reports she continues to feel well, she remains asymptomatic. Her blood pressure has improved with her home medications. I reviewed her labs. CBC with chronic mild  anemia, hemoglobin 9.6. CMP shows normal potassium of 4.6 today. Creatinine is 3.03 which appears to be close to her baseline, it has improved from 3.14 yesterday. Because she received some fluids in the emergency department, we discussed that she would take an extra dose of Lasix tonight. She'll follow-up with her primary care doctor on Monday. She will follow-up with her cardiologist on Wednesday. We discussed return precautions, need for close follow-up and she is comfortable with the discharge plan. DC home.  ____________________________________________   FINAL CLINICAL IMPRESSION(S) / ED DIAGNOSES  Final diagnoses:  Hyperkalemia  Essential hypertension      NEW MEDICATIONS STARTED DURING THIS VISIT:  New Prescriptions   No medications on file     Note:  This document was prepared using Dragon voice recognition software and may include unintentional dictation errors.     A , MD 09/10/15 1404 

## 2015-09-10 NOTE — Telephone Encounter (Signed)
Called and spoke with patient regarding lab results. Potassium is up to 6.0 from 5.2 one week ago. Her creatinine is not appreciably improved from previously. She reports no palpitations or muscle weakness. Given increase in potassium with her level of renal dysfunction (GFR 15) I discussed the need for reevaluation today and advised evaluation in the ED or at [facility] urgent care for recheck. Patient opted for [facility] ED evaluation. I called the charge nurse and let them know the patient was on her way and informed them of the labs that were found.

## 2015-09-13 ENCOUNTER — Encounter: Payer: BLUE CROSS/BLUE SHIELD | Admitting: Cardiovascular Disease

## 2015-09-15 ENCOUNTER — Ambulatory Visit (INDEPENDENT_AMBULATORY_CARE_PROVIDER_SITE_OTHER): Payer: BLUE CROSS/BLUE SHIELD | Admitting: Cardiovascular Disease

## 2015-09-15 VITALS — BP 148/70 | HR 69 | Ht 64.0 in | Wt 205.8 lb

## 2015-09-15 DIAGNOSIS — I25709 Atherosclerosis of coronary artery bypass graft(s), unspecified, with unspecified angina pectoris: Secondary | ICD-10-CM

## 2015-09-15 DIAGNOSIS — I10 Essential (primary) hypertension: Secondary | ICD-10-CM | POA: Diagnosis not present

## 2015-09-15 DIAGNOSIS — I4891 Unspecified atrial fibrillation: Secondary | ICD-10-CM

## 2015-09-15 DIAGNOSIS — N189 Chronic kidney disease, unspecified: Secondary | ICD-10-CM

## 2015-09-15 DIAGNOSIS — I9789 Other postprocedural complications and disorders of the circulatory system, not elsewhere classified: Secondary | ICD-10-CM | POA: Diagnosis not present

## 2015-09-15 MED ORDER — HYDRALAZINE HCL 25 MG PO TABS: 25.0000 mg | ORAL_TABLET | Freq: Three times a day (TID) | ORAL | Status: DC

## 2015-09-15 NOTE — Patient Instructions (Addendum)
Medication Instructions:  Your physician recommends that you continue on your current medications as directed. Please refer to the Current Medication list given to you today.   Labwork: none  Testing/Procedures: Your physician has requested that you have a renal artery duplex. During this test, an ultrasound is used to evaluate blood flow to the kidneys. Allow one hour for this exam. Do not eat after midnight the day before and avoid carbonated beverages. Take your medications as you usually do.    Follow-Up: Your physician recommends that you schedule a follow-up appointment in: two months with Dr. Fletcher Anon.    Any Other Special Instructions Will Be Listed Below (If Applicable).      If you need a refill on your cardiac medications before your next appointment, please call your pharmacy.  Potassium Content of Foods Potassium is a mineral found in many foods and drinks. It helps keep fluids and minerals balanced in your body and affects how steadily your heart beats. Potassium also helps control your blood pressure and keep your muscles and nervous system healthy. Certain health conditions and medicines may change the balance of potassium in your body. When this happens, you can help balance your level of potassium through the foods that you do or do not eat. Your health care provider or dietitian may recommend an amount of potassium that you should have each day. The following lists of foods provide the amount of potassium (in parentheses) per serving in each item. HIGH IN POTASSIUM  The following foods and beverages have 200 mg or more of potassium per serving:  Apricots, 2 raw or 5 dry (200 mg).  Artichoke, 1 medium (345 mg).  Avocado, raw,  each (245 mg).  Banana, 1 medium (425 mg).  Beans, lima, or baked beans, canned,  cup (280 mg).  Beans, white, canned,  cup (595 mg).  Beef roast, 3 oz (320 mg).  Beef, ground, 3 oz (270 mg).  Beets, raw or cooked,  cup (260  mg).  Bran muffin, 2 oz (300 mg).  Broccoli,  cup (230 mg).  Brussels sprouts,  cup (250 mg).  Cantaloupe,  cup (215 mg).  Cereal, 100% bran,  cup (200-400 mg).  Cheeseburger, single, fast food, 1 each (225-400 mg).  Chicken, 3 oz (220 mg).  Clams, canned, 3 oz (535 mg).  Crab, 3 oz (225 mg).  Dates, 5 each (270 mg).  Dried beans and peas,  cup (300-475 mg).  Figs, dried, 2 each (260 mg).  Fish: halibut, tuna, cod, snapper, 3 oz (480 mg).  Fish: salmon, haddock, swordfish, perch, 3 oz (300 mg).  Fish, tuna, canned 3 oz (200 mg).  [place] fries, fast food, 3 oz (470 mg).  Granola with fruit and nuts,  cup (200 mg).  Grapefruit juice,  cup (200 mg).  Greens, beet,  cup (655 mg).  Honeydew melon,  cup (200 mg).  Kale, raw, 1 cup (300 mg).  Kiwi, 1 medium (240 mg).  Kohlrabi, rutabaga, parsnips,  cup (280 mg).  Lentils,  cup (365 mg).  Mango, 1 each (325 mg).  Milk, chocolate, 1 cup (420 mg).  Milk: nonfat, low-fat, whole, buttermilk, 1 cup (350-380 mg).  Molasses, 1 Tbsp (295 mg).  Mushrooms,  cup (280) mg.  Nectarine, 1 each (275 mg).  Nuts: almonds, peanuts, hazelnuts, [place], cashew, mixed, 1 oz (200 mg).  Nuts, pistachios, 1 oz (295 mg).  Orange, 1 each (240 mg).  Orange juice,  cup (235 mg).  Papaya, medium,  fruit (390  mg).  Peanut butter, chunky, 2 Tbsp (240 mg).  Peanut butter, smooth, 2 Tbsp (210 mg).  Pear, 1 medium (200 mg).  Pomegranate, 1 whole (400 mg).  Pomegranate juice,  cup (215 mg).  Pork, 3 oz (350 mg).  Potato chips, salted, 1 oz (465 mg).  Potato, baked with skin, 1 medium (925 mg).  Potatoes, boiled,  cup (255 mg).  Potatoes, mashed,  cup (330 mg).  Prune juice,  cup (370 mg).  Prunes, 5 each (305 mg).  Pudding, chocolate,  cup (230 mg).  Pumpkin, canned,  cup (250 mg).  Raisins, seedless,  cup (270 mg).  Seeds, sunflower or pumpkin, 1 oz (240 mg).  Soy milk, 1 cup (300  mg).  Spinach,  cup (420 mg).  Spinach, canned,  cup (370 mg).  Sweet potato, baked with skin, 1 medium (450 mg).  Swiss chard,  cup (480 mg).  Tomato or vegetable juice,  cup (275 mg).  Tomato sauce or puree,  cup (400-550 mg).  Tomato, raw, 1 medium (290 mg).  Tomatoes, canned,  cup (200-300 mg).  [place], 3 oz (250 mg).  Wheat germ, 1 oz (250 mg).  Winter squash,  cup (250 mg).  Yogurt, plain or fruited, 6 oz (260-435 mg).  Zucchini,  cup (220 mg). MODERATE IN POTASSIUM The following foods and beverages have 50-200 mg of potassium per serving:  Apple, 1 each (150 mg).  Apple juice,  cup (150 mg).  Applesauce,  cup (90 mg).  Apricot nectar,  cup (140 mg).  Asparagus, small spears,  cup or 6 spears (155 mg).  Bagel, cinnamon raisin, 1 each (130 mg).  Bagel, egg or plain, 4 in., 1 each (70 mg).  Beans, green,  cup (90 mg).  Beans, yellow,  cup (190 mg).  Beer, regular, 12 oz (100 mg).  Beets, canned,  cup (125 mg).  Blackberries,  cup (115 mg).  Blueberries,  cup (60 mg).  Bread, whole wheat, 1 slice (70 mg).  Broccoli, raw,  cup (145 mg).  Cabbage,  cup (150 mg).  Carrots, cooked or raw,  cup (180 mg).  Cauliflower, raw,  cup (150 mg).  Celery, raw,  cup (155 mg).  Cereal, bran flakes, cup (120-150 mg).  Cheese, cottage,  cup (110 mg).  Cherries, 10 each (150 mg).  Chocolate, 1 oz bar (165 mg).  Coffee, brewed 6 oz (90 mg).  Corn,  cup or 1 ear (195 mg).  Cucumbers,  cup (80 mg).  Egg, large, 1 each (60 mg).  Eggplant,  cup (60 mg).  Endive, raw, cup (80 mg).  English muffin, 1 each (65 mg).  Fish, orange roughy, 3 oz (150 mg).  Frankfurter, beef or pork, 1 each (75 mg).  Fruit cocktail,  cup (115 mg).  Grape juice,  cup (170 mg).  Grapefruit,  fruit (175 mg).  Grapes,  cup (155 mg).  Greens: kale, turnip, collard,  cup (110-150 mg).  Ice cream or frozen yogurt, chocolate,  cup  (175 mg).  Ice cream or frozen yogurt, vanilla,  cup (120-150 mg).  Lemons, limes, 1 each (80 mg).  Lettuce, all types, 1 cup (100 mg).  Mixed vegetables,  cup (150 mg).  Mushrooms, raw,  cup (110 mg).  Nuts: walnuts, pecans, or macadamia, 1 oz (125 mg).  Oatmeal,  cup (80 mg).  Okra,  cup (110 mg).  Onions, raw,  cup (120 mg).  Peach, 1 each (185 mg).  Peaches, canned,  cup (120 mg).  Pears, canned,  cup (120 mg).  Peas, green, frozen,  cup (90 mg).  Peppers, green,  cup (130 mg).  Peppers, red,  cup (160 mg).  Pineapple juice,  cup (165 mg).  Pineapple, fresh or canned,  cup (100 mg).  Plums, 1 each (105 mg).  Pudding, vanilla,  cup (150 mg).  Raspberries,  cup (90 mg).  Rhubarb,  cup (115 mg).  Rice, wild,  cup (80 mg).  Shrimp, 3 oz (155 mg).  Spinach, raw, 1 cup (170 mg).  Strawberries,  cup (125 mg).  Summer squash  cup (175-200 mg).  Swiss chard, raw, 1 cup (135 mg).  Tangerines, 1 each (140 mg).  Tea, brewed, 6 oz (65 mg).  Turnips,  cup (140 mg).  Watermelon,  cup (85 mg).  Wine, red, table, 5 oz (180 mg).  Wine, white, table, 5 oz (100 mg). LOW IN POTASSIUM The following foods and beverages have less than 50 mg of potassium per serving.  Bread, white, 1 slice (30 mg).  Carbonated beverages, 12 oz (less than 5 mg).  Cheese, 1 oz (20-30 mg).  Cranberries,  cup (45 mg).  Cranberry juice cocktail,  cup (20 mg).  Fats and oils, 1 Tbsp (less than 5 mg).  Hummus, 1 Tbsp (32 mg).  Nectar: papaya, mango, or pear,  cup (35 mg).  Rice, white or brown,  cup (50 mg).  Spaghetti or macaroni,  cup cooked (30 mg).  Tortilla, flour or corn, 1 each (50 mg).  Waffle, 4 in., 1 each (50 mg).  Water chestnuts,  cup (40 mg).   This information is not intended to replace advice given to you by your health care provider. Make sure you discuss any questions you have with your health care provider.   Document  Released: 10/03/2004 Document Revised: 02/24/2013 Document Reviewed: 01/16/2013 Elsevier Interactive Patient Education [employer].

## 2015-09-15 NOTE — Progress Notes (Signed)
Cardiology Office Note   Date:  09/15/2015   ID:  Jamie Arias, DOB 01-25-1956, MRN [id number]  PCP:  [name], MD  Cardiologist:   Kathlyn Sacramento, MD   Chief Complaint  Patient presents with  . other    F/u hospital. Meds reviewed verbally with pt.      History of Present Illness: Jamie Arias is a 60 y.o. female who presents for a followup regarding coronary artery disease status post CABG and chronic diastolic heart failure. She has known history of diabetes, chronic kidney disease, anemia, hypertension and hyperlipidemia. She presented in April of 2015 with non-ST elevation myocardial infarction with late presentation. Ejection fraction was 35% with multiple wall motion abnormalities and moderate mitral regurgitation. She underwent cardiac catheterization which showed severe three-vessel coronary artery disease. She underwent CABG at [facility].  Most recent echocardiogram in February of 2016 showed an ejection fraction of 55-60% with grade 2 diastolic dysfunction, mild aortic regurgitation and minimal pulmonary hypertension.  She had cholecystectomy done in 2016.   She was seen most recently in May for worsening dyspnea, leg edema and weight gain. She was noted to be volume overloaded. She does have chronic kidney disease with gradual worsening and also has anemia of chronic kidney disease. During last visit, I increased the dose of furosemide to 20 mg once daily. She improved after that with decrease in weight from 222 pounds to current weight of 205. However, she had recurrent issues of nausea thought to be due to gastroparesis with recent brief admission for hyperkalemia and worsening renal function. Most recent labs from July 8 showed a hemoglobin of 9.4 creatinine was 3.03 with a potassium of 4.6 and an albumin of 2.2.  She reports improvement in shortness of breath. No chest pain.     Past Medical History  Diagnosis Date  . Diabetes mellitus without complication ([place])    . Hypertension   . Hyperlipidemia   . Hypothyroidism   . Diabetic neuropathy ([place])   . Chronic systolic heart failure ([place])     a. Due to ischemic cardiomyopathy. EF as low as 35%, improved to normal s/p CABG; b. echo 07/06/13: EF 55-60%, no RWMAs, mod TR, trivial pericardial effusion not c/w tamponade physiology  . Anemia   . Coronary artery disease     a. NSTEMI 06/2013; b.cath: severe three-vessel CAD w/ EF 30% & mild-mod MR; c. s/p 3 vessel CABG 07/02/13 (LIMA-LAD, SVG-OM, and SVG-RPDA).  . Myocardial infarction ([place]) 2015  . CHF (congestive heart failure) ([place])   . Chronic kidney disease     EFGR 31  . Pulmonary hypertension ([place])   . Pleural effusion 2015    Past Surgical History  Procedure Laterality Date  . Cataract extraction Bilateral   . Coronary artery bypass graft N/A 07/02/2013    Procedure: CORONARY ARTERY BYPASS GRAFTING (CABG);  Surgeon: Ivin [name], MD;  Location: [place];  Service: Open Heart Surgery;  Laterality: N/A;  CABG x three, using left internal mammary artery and right leg greater saphenous vein harvested endoscopically  . Intraoperative transesophageal echocardiogram N/A 07/02/2013    Procedure: INTRAOPERATIVE TRANSESOPHAGEAL ECHOCARDIOGRAM;  Surgeon: Ivin [name], MD;  Location: [place];  Service: Open Heart Surgery;  Laterality: N/A;  . Thoracentesis Left 2015  . Cholecystectomy N/A 12/09/2014    Procedure: LAPAROSCOPIC CHOLECYSTECTOMY;  Surgeon: Marlyce [name], MD;  Location: [facility];  Service: General;  Laterality: N/A;     Current Outpatient Prescriptions  Medication Sig Dispense  Refill  . aspirin EC 81 MG tablet Take 1 tablet (81 mg total) by mouth daily. 90 tablet 3  . atorvastatin (LIPITOR) 80 MG tablet TAKE ONE TABLET BY MOUTH ONCE DAILY (Patient taking differently: TAKE ONE TABLET BY MOUTH ONCE DAILY IN THE AFTERNOON) 90 tablet 3  . bismuth-metronidazole-tetracycline (PYLERA) 140-125-125 MG capsule Take 3 capsules by mouth 4 (four) times  daily -  before meals and at bedtime. 120 capsule 0  . blood glucose meter kit and supplies KIT Dispense based on patient and insurance preference. Use up to twice daily. (FOR ICD-10 E11.9) 1 each 0  . carvedilol (COREG) 6.25 MG tablet TAKE ONE TABLET BY MOUTH TWICE DAILY 180 tablet 3  . furosemide (LASIX) 20 MG tablet Take 1 tablet (20 mg total) by mouth daily. 30 tablet 0  . glucose blood test strip Test 4 times daily Dx. E11.9. One Touch ultra strips 100 each 12  . hydrALAZINE (APRESOLINE) 25 MG tablet Take 1 tablet (25 mg total) by mouth 3 (three) times daily. 30 tablet 0  . insulin aspart protamine - aspart (NOVOLOG 70/30 MIX) (70-30) 100 UNIT/ML FlexPen Inject 0.08 mLs (8 Units total) into the skin 2 (two) times daily. 15 mL 11  . levothyroxine (SYNTHROID, LEVOTHROID) 75 MCG tablet Take 1 tablet (75 mcg total) by mouth daily. 90 tablet 1  . NITROSTAT 0.4 MG SL tablet DISSOLVE ONE TABLET UNDER THE TONGUE EVERY 5 MINUTES AS NEEDED FOR CHEST PAIN.  DO NOT EXCEED A TOTAL OF 3 DOSES IN 15 MINUTES 25 tablet 1  . omeprazole (PRILOSEC) 40 MG capsule Take 1 capsule (40 mg total) by mouth daily. 90 capsule 3  . ondansetron (ZOFRAN) 4 MG tablet TAKE ONE TABLET BY MOUTH ONCE DAILY AS NEEDED FOR NAUSEA AND VOMITING 10 tablet 0  . ondansetron (ZOFRAN-ODT) 8 MG disintegrating tablet DISSOLVE ONE TABLET IN MOUTH EVERY 8 HOURS AS NEEDED FOR NAUSEA AND VOMITING 10 tablet 0  . promethazine (PHENERGAN) 12.5 MG tablet TAKE ONE TABLET BY MOUTH EVERY 6 HOURS AS NEEDED FOR NAUSEA AND VOMITING 30 tablet 0  . traZODone (DESYREL) 50 MG tablet TAKE ONE TABLET BY MOUTH AT BEDTIME AS NEEDED FOR SLEEP 30 tablet 0  . amLODipine (NORVASC) 5 MG tablet Take 0.5 tablets (2.5 mg total) by mouth daily. (Patient not taking: Reported on 09/15/2015) 45 tablet 3   No current facility-administered medications for this visit.    Allergies:   Review of patient's allergies indicates no known allergies.    Social History:  The patient   reports that she has never smoked. She has never used smokeless tobacco. She reports that she does not drink alcohol or use illicit drugs.   Family History:  The patient's family history includes COPD in her mother; Cancer in her mother; Diabetes in her father and paternal grandfather; Heart disease in her maternal grandmother; Pulmonary embolism in her father. There is no history of Colon cancer, Colon polyps, Esophageal cancer, Pancreatic cancer, or Liver disease.    ROS:  Please see the history of present illness.   Otherwise, review of systems are positive for none.   All other systems are reviewed and negative.    PHYSICAL EXAM: VS:  BP 148/70 mmHg  Pulse 69  Ht _0  (1.626 m)  Wt 205 lb 12 oz (93.328 kg)  BMI 35.30 kg/m2 , BMI Body mass index is 35.3 kg/(m^2). GEN: Well nourished, well developed, in no acute distress HEENT: normal Neck: no JVD, carotid bruits, or  masses Cardiac: RRR; no murmurs, rubs, or gallops, mild edema  Respiratory:  clear to auscultation bilaterally, normal work of breathing GI: soft, nontender, nondistended, + BS MS: no deformity or atrophy Skin: warm and dry, no rash Neuro:  Strength and sensation are intact Psych: euthymic mood, full affect   EKG:  EKG is ordered today. Sinus arrhythmia with low voltage and possible old anterior infarct.   Recent Labs: 06/24/2015: TSH 5.95* 09/10/2015: ALT 17; BUN 43*; Creatinine, Ser 3.03*; Hemoglobin 9.4*; Platelets 169; Potassium 4.6; Sodium 138    Lipid Panel    Component Value Date/Time   CHOL 194 06/24/2015 0805   CHOL 171 12/18/2013 0815   TRIG 77.0 06/24/2015 0805   HDL 64.20 06/24/2015 0805   HDL 63 12/18/2013 0815   CHOLHDL 3 06/24/2015 0805   CHOLHDL 2.7 12/18/2013 0815   VLDL 15.4 06/24/2015 0805   LDLCALC 114* 06/24/2015 0805   LDLCALC 93 12/18/2013 0815   LDLDIRECT 126.9 10/31/2012 0801      Wt Readings from Last 3 Encounters:  09/15/15 205 lb 12 oz (93.328 kg)  09/10/15 211 lb (95.709  kg)  09/09/15 215 lb 12.8 oz (97.886 kg)       ASSESSMENT AND PLAN:  1.  Chronic systolic heart failure: Most recent ejection fraction was normal in 2016.  Volume overload improved significantly with small dose furosemide. Biggest issue seems to be worsening chronic kidney disease. ACE inhibitor was discontinued due to that. Continue current medications.   I will consider repeat echocardiogram in the near future given the slight change in her EKG. There is now low voltage with pattern of old anterior MI which is suspicious for infiltrative heart disease.  2. Coronary artery disease involving native coronary arteries: Currently with only very rare episodes of atypical chest pain. Continue medical therapy.  3. Essential hypertension: Blood pressure improved with the addition of small dose amlodipine. Lisinopril was switched to hydralazine due to worsening kidney function.   4. Anemia: This is likely anemia of chronic disease related to chronic kidney disease . Slight improvement recently.  5. Chronic kidney disease likely due to diabetic nephropathy.  based on her presentations on lab work, there is high suspicion for nephrotic syndrome. She has an outpatient nephrology consultation in 2 weeks. I provided her with low potassium diet instructions. I also requested renal artery duplex to ensure no renal artery stenosis as the culprit for her worsening.    Disposition:   FU with me in 2 months  Signed,  Kathlyn Sacramento, MD  09/15/2015 10:23 AM    [place]

## 2015-09-16 ENCOUNTER — Telehealth: Payer: Self-pay | Admitting: Family Medicine

## 2015-09-16 NOTE — Telephone Encounter (Signed)
Pt dropped off Short Term disability papers to be filled out. Papers will be placed in Dr. Ellen Henri box.

## 2015-09-19 NOTE — Telephone Encounter (Signed)
Placed form on desk

## 2015-09-21 NOTE — Telephone Encounter (Signed)
Spoke with patient and she stated that she is still to weak to return to work. She has a kidney appointment on Friday and I have moved up her appointment with [place]. She said that on the short term disability forms you can put her appointment date and that would work.

## 2015-09-21 NOTE — Telephone Encounter (Addendum)
Patient's husband has requested a update on paperwork [name] [phone]

## 2015-09-21 NOTE — Telephone Encounter (Signed)
Please advise on status, thanks

## 2015-09-21 NOTE — Telephone Encounter (Signed)
Please contact the patient to see how she is doing with regards to her nausea. She was improved at her last visit and if continues to be improved I will release her to go back to work. Thanks.

## 2015-09-22 NOTE — Telephone Encounter (Signed)
Form completed and signed. Given the [place].

## 2015-09-22 NOTE — Telephone Encounter (Signed)
Notified patient husband that form is up front for them to pick up

## 2015-09-28 ENCOUNTER — Encounter: Payer: Self-pay | Admitting: Family Medicine

## 2015-09-30 ENCOUNTER — Ambulatory Visit: Payer: BLUE CROSS/BLUE SHIELD | Admitting: Family Medicine

## 2015-10-04 ENCOUNTER — Other Ambulatory Visit: Payer: Self-pay | Admitting: Family Medicine

## 2015-10-05 NOTE — Telephone Encounter (Signed)
Sent to pharmacy 

## 2015-10-06 ENCOUNTER — Ambulatory Visit: Payer: BLUE CROSS/BLUE SHIELD

## 2015-10-06 ENCOUNTER — Telehealth: Payer: Self-pay | Admitting: Gastroenterology

## 2015-10-06 DIAGNOSIS — N189 Chronic kidney disease, unspecified: Secondary | ICD-10-CM | POA: Diagnosis not present

## 2015-10-06 DIAGNOSIS — I10 Essential (primary) hypertension: Secondary | ICD-10-CM

## 2015-10-07 ENCOUNTER — Ambulatory Visit: Payer: BLUE CROSS/BLUE SHIELD | Admitting: Family Medicine

## 2015-10-07 NOTE — Telephone Encounter (Signed)
Called to patient. She was unable to come to the phone at that time. Will try back. She is likely calling about the follow up stool study for eradication of H Pylori. Treated with Pylera in June.

## 2015-10-09 ENCOUNTER — Other Ambulatory Visit: Payer: Self-pay | Admitting: Family Medicine

## 2015-10-14 ENCOUNTER — Ambulatory Visit: Payer: BLUE CROSS/BLUE SHIELD | Admitting: Family Medicine

## 2015-10-14 NOTE — Telephone Encounter (Signed)
Spoke with Jamie Arias. Patient is ready  To do the follow up lab. Order is in.

## 2015-10-16 ENCOUNTER — Other Ambulatory Visit: Payer: Self-pay | Admitting: Nurse Practitioner

## 2015-10-16 ENCOUNTER — Other Ambulatory Visit: Payer: Self-pay | Admitting: Family Medicine

## 2015-10-17 NOTE — Telephone Encounter (Signed)
Sent to pharmacy 

## 2015-10-17 NOTE — Telephone Encounter (Signed)
Can we fill these

## 2015-10-17 NOTE — Telephone Encounter (Signed)
Are you ok to fill this. Patient stated that she takes at night for foot pain

## 2015-10-19 ENCOUNTER — Telehealth: Payer: Self-pay | Admitting: Family Medicine

## 2015-10-19 ENCOUNTER — Ambulatory Visit: Payer: BLUE CROSS/BLUE SHIELD | Admitting: Family Medicine

## 2015-10-19 ENCOUNTER — Telehealth: Payer: Self-pay | Admitting: *Deleted

## 2015-10-19 NOTE — Telephone Encounter (Signed)
Noted. Patient taken off of schedule

## 2015-10-19 NOTE — Telephone Encounter (Signed)
FYI, Pt husband called stating she will not be able to make her appt today due to husband having to go out of town. Appt is still on the sch. Pt did make another appt. Let me know if you want me to cancel appt. Thank you!

## 2015-10-19 NOTE — Telephone Encounter (Addendum)
[employer] will fax over forms to be filled out for patient supplies

## 2015-10-23 ENCOUNTER — Observation Stay
Admit: 2015-10-23 | Discharge: 2015-10-23 | Disposition: A | Discharge status: Home / Self Care | Payer: BLUE CROSS/BLUE SHIELD | Attending: Internal Medicine | Admitting: Internal Medicine

## 2015-10-23 ENCOUNTER — Inpatient Hospital Stay
Admission: EM | Admit: 2015-10-23 | Discharge: 2015-10-26 | DRG: 303 | Disposition: A | Discharge status: Home / Self Care | Payer: BLUE CROSS/BLUE SHIELD | Attending: Internal Medicine | Admitting: Internal Medicine

## 2015-10-23 ENCOUNTER — Emergency Department: Payer: BLUE CROSS/BLUE SHIELD

## 2015-10-23 ENCOUNTER — Encounter: Payer: Self-pay | Admitting: Emergency Medicine

## 2015-10-23 DIAGNOSIS — N184 Chronic kidney disease, stage 4 (severe): Secondary | ICD-10-CM | POA: Diagnosis present

## 2015-10-23 DIAGNOSIS — Z9841 Cataract extraction status, right eye: Secondary | ICD-10-CM

## 2015-10-23 DIAGNOSIS — Z7982 Long term (current) use of aspirin: Secondary | ICD-10-CM

## 2015-10-23 DIAGNOSIS — E1121 Type 2 diabetes mellitus with diabetic nephropathy: Secondary | ICD-10-CM | POA: Diagnosis present

## 2015-10-23 DIAGNOSIS — I255 Ischemic cardiomyopathy: Secondary | ICD-10-CM | POA: Diagnosis present

## 2015-10-23 DIAGNOSIS — I200 Unstable angina: Secondary | ICD-10-CM | POA: Diagnosis not present

## 2015-10-23 DIAGNOSIS — E119 Type 2 diabetes mellitus without complications: Secondary | ICD-10-CM

## 2015-10-23 DIAGNOSIS — Z8249 Family history of ischemic heart disease and other diseases of the circulatory system: Secondary | ICD-10-CM

## 2015-10-23 DIAGNOSIS — I25110 Atherosclerotic heart disease of native coronary artery with unstable angina pectoris: Principal | ICD-10-CM | POA: Diagnosis present

## 2015-10-23 DIAGNOSIS — Z833 Family history of diabetes mellitus: Secondary | ICD-10-CM

## 2015-10-23 DIAGNOSIS — Z9842 Cataract extraction status, left eye: Secondary | ICD-10-CM

## 2015-10-23 DIAGNOSIS — N183 Chronic kidney disease, stage 3 unspecified: Secondary | ICD-10-CM

## 2015-10-23 DIAGNOSIS — I10 Essential (primary) hypertension: Secondary | ICD-10-CM

## 2015-10-23 DIAGNOSIS — Z951 Presence of aortocoronary bypass graft: Secondary | ICD-10-CM

## 2015-10-23 DIAGNOSIS — Z79899 Other long term (current) drug therapy: Secondary | ICD-10-CM

## 2015-10-23 DIAGNOSIS — I5042 Chronic combined systolic (congestive) and diastolic (congestive) heart failure: Secondary | ICD-10-CM | POA: Diagnosis present

## 2015-10-23 DIAGNOSIS — I25700 Atherosclerosis of coronary artery bypass graft(s), unspecified, with unstable angina pectoris: Secondary | ICD-10-CM

## 2015-10-23 DIAGNOSIS — E039 Hypothyroidism, unspecified: Secondary | ICD-10-CM | POA: Diagnosis present

## 2015-10-23 DIAGNOSIS — I130 Hypertensive heart and chronic kidney disease with heart failure and stage 1 through stage 4 chronic kidney disease, or unspecified chronic kidney disease: Secondary | ICD-10-CM | POA: Diagnosis present

## 2015-10-23 DIAGNOSIS — D631 Anemia in chronic kidney disease: Secondary | ICD-10-CM | POA: Diagnosis present

## 2015-10-23 DIAGNOSIS — Z9049 Acquired absence of other specified parts of digestive tract: Secondary | ICD-10-CM

## 2015-10-23 DIAGNOSIS — K219 Gastro-esophageal reflux disease without esophagitis: Secondary | ICD-10-CM | POA: Diagnosis present

## 2015-10-23 DIAGNOSIS — I252 Old myocardial infarction: Secondary | ICD-10-CM

## 2015-10-23 DIAGNOSIS — R0789 Other chest pain: Secondary | ICD-10-CM

## 2015-10-23 DIAGNOSIS — Z825 Family history of asthma and other chronic lower respiratory diseases: Secondary | ICD-10-CM

## 2015-10-23 DIAGNOSIS — E1143 Type 2 diabetes mellitus with diabetic autonomic (poly)neuropathy: Secondary | ICD-10-CM | POA: Diagnosis present

## 2015-10-23 DIAGNOSIS — Z801 Family history of malignant neoplasm of trachea, bronchus and lung: Secondary | ICD-10-CM

## 2015-10-23 DIAGNOSIS — R079 Chest pain, unspecified: Secondary | ICD-10-CM

## 2015-10-23 DIAGNOSIS — K922 Gastrointestinal hemorrhage, unspecified: Secondary | ICD-10-CM | POA: Diagnosis present

## 2015-10-23 DIAGNOSIS — E785 Hyperlipidemia, unspecified: Secondary | ICD-10-CM | POA: Diagnosis present

## 2015-10-23 DIAGNOSIS — I2510 Atherosclerotic heart disease of native coronary artery without angina pectoris: Secondary | ICD-10-CM | POA: Diagnosis present

## 2015-10-23 DIAGNOSIS — E871 Hypo-osmolality and hyponatremia: Secondary | ICD-10-CM | POA: Diagnosis present

## 2015-10-23 DIAGNOSIS — N179 Acute kidney failure, unspecified: Secondary | ICD-10-CM | POA: Diagnosis present

## 2015-10-23 DIAGNOSIS — E872 Acidosis: Secondary | ICD-10-CM | POA: Diagnosis present

## 2015-10-23 DIAGNOSIS — Z794 Long term (current) use of insulin: Secondary | ICD-10-CM

## 2015-10-23 DIAGNOSIS — R112 Nausea with vomiting, unspecified: Secondary | ICD-10-CM

## 2015-10-23 DIAGNOSIS — K3184 Gastroparesis: Secondary | ICD-10-CM | POA: Diagnosis present

## 2015-10-23 DIAGNOSIS — E1122 Type 2 diabetes mellitus with diabetic chronic kidney disease: Secondary | ICD-10-CM | POA: Diagnosis present

## 2015-10-23 DIAGNOSIS — I5032 Chronic diastolic (congestive) heart failure: Secondary | ICD-10-CM

## 2015-10-23 DIAGNOSIS — N186 End stage renal disease: Secondary | ICD-10-CM

## 2015-10-23 DIAGNOSIS — I119 Hypertensive heart disease without heart failure: Secondary | ICD-10-CM

## 2015-10-23 LAB — BASIC METABOLIC PANEL
Anion gap: 10 (ref 5–15)
BUN: 28 mg/dL — ABNORMAL HIGH (ref 6–20)
CO2: 20 mmol/L — ABNORMAL LOW (ref 22–32)
Calcium: 8.7 mg/dL — ABNORMAL LOW (ref 8.9–10.3)
Chloride: 105 mmol/L (ref 101–111)
Creatinine, Ser: 3.36 mg/dL — ABNORMAL HIGH (ref 0.44–1.00)
GFR calc Af Amer: 16 mL/min — ABNORMAL LOW (ref >60)
GFR calc non Af Amer: 14 mL/min — ABNORMAL LOW (ref >60)
Glucose, Bld: 227 mg/dL — ABNORMAL HIGH (ref 65–99)
Potassium: 3.9 mmol/L (ref 3.5–5.1)
Sodium: 135 mmol/L (ref 135–145)

## 2015-10-23 LAB — HEPATIC FUNCTION PANEL
ALT: 11 U/L — ABNORMAL LOW (ref 14–54)
AST: 23 U/L (ref 15–41)
Albumin: 2.8 g/dL — ABNORMAL LOW (ref 3.5–5.0)
Alkaline Phosphatase: 66 U/L (ref 38–126)
Bilirubin, Direct: <0.1 mg/dL — ABNORMAL LOW (ref 0.1–0.5)
Total Bilirubin: 0.6 mg/dL (ref 0.3–1.2)
Total Protein: 6.3 g/dL — ABNORMAL LOW (ref 6.5–8.1)

## 2015-10-23 LAB — URINALYSIS COMPLETE WITH MICROSCOPIC (ARMC ONLY)
Bilirubin Urine: NEGATIVE
Glucose, UA: >500 mg/dL — AB
Leukocytes, UA: NEGATIVE
Nitrite: NEGATIVE
Protein, ur: >500 mg/dL — AB
Specific Gravity, Urine: 1.013 (ref 1.005–1.030)
pH: 7 (ref 5.0–8.0)

## 2015-10-23 LAB — GLUCOSE, CAPILLARY
Glucose-Capillary: 275 mg/dL — ABNORMAL HIGH (ref 65–99)
Glucose-Capillary: 262 mg/dL — ABNORMAL HIGH (ref 65–99)
Glucose-Capillary: 234 mg/dL — ABNORMAL HIGH (ref 65–99)
Glucose-Capillary: 125 mg/dL — ABNORMAL HIGH (ref 65–99)
Glucose-Capillary: 192 mg/dL — ABNORMAL HIGH (ref 65–99)

## 2015-10-23 LAB — TROPONIN I
Troponin I: <0.03 ng/mL (ref <0.03)
Troponin I: <0.03 ng/mL (ref <0.03)
Troponin I: 0.04 ng/mL (ref <0.03)
Troponin I: 0.06 ng/mL (ref <0.03)

## 2015-10-23 LAB — CREATININE, SERUM
Creatinine, Ser: 3.4 mg/dL — ABNORMAL HIGH (ref 0.44–1.00)
GFR calc Af Amer: 16 mL/min — ABNORMAL LOW (ref >60)
GFR calc non Af Amer: 14 mL/min — ABNORMAL LOW (ref >60)

## 2015-10-23 LAB — CBC
HCT: 29.1 % — ABNORMAL LOW (ref 35.0–47.0)
Hemoglobin: 10 g/dL — ABNORMAL LOW (ref 12.0–16.0)
MCH: 30 pg (ref 26.0–34.0)
MCHC: 34.4 g/dL (ref 32.0–36.0)
MCV: 87.2 fL (ref 80.0–100.0)
Platelets: 232 10*3/uL (ref 150–440)
RBC: 3.34 MIL/uL — ABNORMAL LOW (ref 3.80–5.20)
RDW: 15.2 % — ABNORMAL HIGH (ref 11.5–14.5)
WBC: 8.5 10*3/uL (ref 3.6–11.0)

## 2015-10-23 LAB — LIPASE, BLOOD: Lipase: 12 U/L (ref 11–51)

## 2015-10-23 LAB — ECHOCARDIOGRAM COMPLETE
Height: 64 in
Weight: 3200 oz

## 2015-10-23 MED ORDER — LABETALOL HCL 5 MG/ML IV SOLN
10.0000 mg | Freq: Once | INTRAVENOUS | Status: AC
  Administered 2015-10-23: 10 mg via INTRAVENOUS
  Filled 2015-10-23: qty 4

## 2015-10-23 MED ORDER — PANTOPRAZOLE SODIUM 40 MG PO TBEC: 40.0000 mg | DELAYED_RELEASE_TABLET | Freq: Every day | ORAL | Status: DC

## 2015-10-23 MED ORDER — CARVEDILOL 6.25 MG PO TABS: 6.2500 mg | ORAL_TABLET | Freq: Two times a day (BID) | ORAL | Status: DC

## 2015-10-23 MED ORDER — TRAZODONE HCL 50 MG PO TABS
50.0000 mg | ORAL_TABLET | Freq: Every day | ORAL | Status: DC
  Administered 2015-10-23 – 2015-10-25 (×3): 50 mg via ORAL
  Filled 2015-10-23 (×4): qty 1

## 2015-10-23 MED ORDER — ASPIRIN 81 MG PO CHEW: 324.0000 mg | CHEWABLE_TABLET | ORAL | Status: DC

## 2015-10-23 MED ORDER — AMLODIPINE BESYLATE 5 MG PO TABS: 2.5000 mg | ORAL_TABLET | Freq: Every day | ORAL | Status: DC

## 2015-10-23 MED ORDER — MORPHINE SULFATE (PF) 2 MG/ML IV SOLN
2.0000 mg | INTRAVENOUS | Status: DC | PRN
  Administered 2015-10-23: 2 mg via INTRAVENOUS
  Filled 2015-10-23: qty 1

## 2015-10-23 MED ORDER — NITROGLYCERIN 2 % TD OINT
1.0000 [in_us] | TOPICAL_OINTMENT | Freq: Once | TRANSDERMAL | Status: DC
Start: 2015-10-23 — End: 2015-10-23
  Filled 2015-10-23: qty 1

## 2015-10-23 MED ORDER — METOCLOPRAMIDE HCL 5 MG/ML IJ SOLN
10.0000 mg | Freq: Four times a day (QID) | INTRAMUSCULAR | Status: DC
  Administered 2015-10-23 – 2015-10-25 (×9): 10 mg via INTRAVENOUS
  Filled 2015-10-23 (×9): qty 2

## 2015-10-23 MED ORDER — ASPIRIN EC 81 MG PO TBEC: 81.0000 mg | DELAYED_RELEASE_TABLET | Freq: Every day | ORAL | Status: DC

## 2015-10-23 MED ORDER — ONDANSETRON HCL 4 MG/2ML IJ SOLN
4.0000 mg | Freq: Once | INTRAMUSCULAR | Status: AC
  Administered 2015-10-23: 4 mg via INTRAVENOUS
  Filled 2015-10-23: qty 2

## 2015-10-23 MED ORDER — INSULIN ASPART 100 UNIT/ML ~~LOC~~ SOLN: 0.0000 [IU] | Freq: Every day | SUBCUTANEOUS | Status: DC

## 2015-10-23 MED ORDER — SODIUM CHLORIDE 0.9% FLUSH: 3.0000 mL | INTRAVENOUS | Status: DC | PRN

## 2015-10-23 MED ORDER — CARVEDILOL 12.5 MG PO TABS
12.5000 mg | ORAL_TABLET | Freq: Two times a day (BID) | ORAL | Status: DC
  Administered 2015-10-23 – 2015-10-26 (×6): 12.5 mg via ORAL
  Filled 2015-10-23 (×7): qty 1

## 2015-10-23 MED ORDER — ACETAMINOPHEN 325 MG PO TABS
650.0000 mg | ORAL_TABLET | ORAL | Status: DC | PRN
Start: 2015-10-23 — End: 2015-10-26

## 2015-10-23 MED ORDER — METOCLOPRAMIDE HCL 5 MG/ML IJ SOLN
10.0000 mg | Freq: Three times a day (TID) | INTRAMUSCULAR | Status: DC | PRN
  Administered 2015-10-23: 10 mg via INTRAVENOUS
  Filled 2015-10-23: qty 2

## 2015-10-23 MED ORDER — HYDRALAZINE HCL 20 MG/ML IJ SOLN
INTRAMUSCULAR | Status: AC
  Administered 2015-10-23: 10 mg via INTRAVENOUS
  Filled 2015-10-23: qty 1

## 2015-10-23 MED ORDER — HYDRALAZINE HCL 20 MG/ML IJ SOLN
20.0000 mg | Freq: Once | INTRAMUSCULAR | Status: AC
  Administered 2015-10-23: 20 mg via INTRAVENOUS
  Filled 2015-10-23: qty 1

## 2015-10-23 MED ORDER — SODIUM CHLORIDE 0.9 % IV SOLN: INTRAVENOUS | Status: DC

## 2015-10-23 MED ORDER — HYDRALAZINE HCL 20 MG/ML IJ SOLN
10.0000 mg | INTRAMUSCULAR | Status: DC | PRN
  Administered 2015-10-23: 10 mg via INTRAVENOUS

## 2015-10-23 MED ORDER — ASPIRIN 81 MG PO CHEW
324.0000 mg | CHEWABLE_TABLET | Freq: Once | ORAL | Status: AC
  Administered 2015-10-23: 324 mg via ORAL

## 2015-10-23 MED ORDER — HEPARIN SODIUM (PORCINE) 5000 UNIT/ML IJ SOLN
5000.0000 [IU] | Freq: Three times a day (TID) | INTRAMUSCULAR | Status: DC
Start: 2015-10-23 — End: 2015-10-24
  Administered 2015-10-23 – 2015-10-24 (×3): 5000 [IU] via SUBCUTANEOUS
  Filled 2015-10-23 (×3): qty 1

## 2015-10-23 MED ORDER — AMLODIPINE BESYLATE 10 MG PO TABS
10.0000 mg | ORAL_TABLET | Freq: Every day | ORAL | Status: DC
  Administered 2015-10-24 – 2015-10-26 (×3): 10 mg via ORAL
  Filled 2015-10-23 (×3): qty 1

## 2015-10-23 MED ORDER — INSULIN ASPART 100 UNIT/ML ~~LOC~~ SOLN
0.0000 [IU] | Freq: Three times a day (TID) | SUBCUTANEOUS | Status: DC
  Administered 2015-10-23: 2 [IU] via SUBCUTANEOUS
  Administered 2015-10-23: 5 [IU] via SUBCUTANEOUS
  Administered 2015-10-23: 8 [IU] via SUBCUTANEOUS
  Administered 2015-10-24 (×2): 2 [IU] via SUBCUTANEOUS
  Administered 2015-10-25: 5 [IU] via SUBCUTANEOUS
  Administered 2015-10-25: 3 [IU] via SUBCUTANEOUS
  Administered 2015-10-26: 2 [IU] via SUBCUTANEOUS
  Filled 2015-10-23: qty 5
  Filled 2015-10-23: qty 8
  Filled 2015-10-23: qty 2
  Filled 2015-10-23: qty 5
  Filled 2015-10-23 (×2): qty 2
  Filled 2015-10-23: qty 3
  Filled 2015-10-23: qty 2

## 2015-10-23 MED ORDER — ONDANSETRON HCL 4 MG/2ML IJ SOLN
4.0000 mg | Freq: Four times a day (QID) | INTRAMUSCULAR | Status: DC | PRN
  Administered 2015-10-23: 4 mg via INTRAVENOUS
  Filled 2015-10-23: qty 2

## 2015-10-23 MED ORDER — MORPHINE SULFATE (PF) 4 MG/ML IV SOLN
4.0000 mg | Freq: Once | INTRAVENOUS | Status: AC
Start: 2015-10-23 — End: 2015-10-23
  Administered 2015-10-23: 4 mg via INTRAVENOUS
  Filled 2015-10-23: qty 1

## 2015-10-23 MED ORDER — ASPIRIN 300 MG RE SUPP: 300.0000 mg | RECTAL | Status: DC

## 2015-10-23 MED ORDER — METRONIDAZOLE 250 MG PO TABS
375.0000 mg | ORAL_TABLET | Freq: Three times a day (TID) | ORAL | Status: DC
  Administered 2015-10-23 – 2015-10-24 (×3): 375 mg via ORAL
  Filled 2015-10-23 (×2): qty 1.5
  Filled 2015-10-23 (×2): qty 2
  Filled 2015-10-23: qty 1.5

## 2015-10-23 MED ORDER — ISOSORBIDE DINITRATE 10 MG PO TABS
10.0000 mg | ORAL_TABLET | Freq: Three times a day (TID) | ORAL | Status: DC
  Administered 2015-10-23 (×3): 10 mg via ORAL
  Filled 2015-10-23 (×4): qty 1

## 2015-10-23 MED ORDER — GABAPENTIN 300 MG PO CAPS
300.0000 mg | ORAL_CAPSULE | Freq: Three times a day (TID) | ORAL | Status: DC
  Administered 2015-10-23 (×2): 300 mg via ORAL
  Filled 2015-10-23 (×3): qty 1

## 2015-10-23 MED ORDER — FUROSEMIDE 10 MG/ML IJ SOLN
60.0000 mg | Freq: Once | INTRAMUSCULAR | Status: AC
  Administered 2015-10-23: 60 mg via INTRAVENOUS
  Filled 2015-10-23: qty 6

## 2015-10-23 MED ORDER — BISMUTH SUBSALICYLATE 262 MG/15ML PO SUSP
24.0000 mL | Freq: Three times a day (TID) | ORAL | Status: DC
  Administered 2015-10-23 – 2015-10-24 (×3): 24 mL via ORAL
  Filled 2015-10-23: qty 118

## 2015-10-23 MED ORDER — REGADENOSON 0.4 MG/5ML IV SOLN
0.4000 mg | Freq: Once | INTRAVENOUS | Status: AC
  Administered 2015-10-24: 0.4 mg via INTRAVENOUS
  Filled 2015-10-23: qty 5

## 2015-10-23 MED ORDER — FUROSEMIDE 20 MG PO TABS: 20.0000 mg | ORAL_TABLET | Freq: Every day | ORAL | Status: DC

## 2015-10-23 MED ORDER — SODIUM CHLORIDE 0.9 % IV SOLN: 250.0000 mL | INTRAVENOUS | Status: DC | PRN

## 2015-10-23 MED ORDER — SODIUM CHLORIDE 0.9% FLUSH
3.0000 mL | Freq: Two times a day (BID) | INTRAVENOUS | Status: DC
  Administered 2015-10-23 – 2015-10-26 (×6): 3 mL via INTRAVENOUS

## 2015-10-23 MED ORDER — LEVOTHYROXINE SODIUM 75 MCG PO TABS
75.0000 ug | ORAL_TABLET | Freq: Every day | ORAL | Status: DC
  Administered 2015-10-24 – 2015-10-26 (×3): 75 ug via ORAL
  Filled 2015-10-23 (×3): qty 1

## 2015-10-23 MED ORDER — TETRACYCLINE HCL 250 MG PO CAPS
500.0000 mg | ORAL_CAPSULE | Freq: Three times a day (TID) | ORAL | Status: DC
Start: 2015-10-23 — End: 2015-10-24
  Administered 2015-10-23 – 2015-10-24 (×3): 500 mg via ORAL
  Filled 2015-10-23 (×6): qty 2

## 2015-10-23 MED ORDER — PANTOPRAZOLE SODIUM 40 MG PO TBEC
40.0000 mg | DELAYED_RELEASE_TABLET | Freq: Two times a day (BID) | ORAL | Status: DC
  Administered 2015-10-23 – 2015-10-26 (×5): 40 mg via ORAL
  Filled 2015-10-23 (×6): qty 1

## 2015-10-23 MED ORDER — ASPIRIN 81 MG PO CHEW
CHEWABLE_TABLET | ORAL | Status: AC
  Administered 2015-10-23: 324 mg via ORAL
  Filled 2015-10-23: qty 4

## 2015-10-23 MED ORDER — NITROGLYCERIN 0.4 MG SL SUBL: 0.4000 mg | SUBLINGUAL_TABLET | SUBLINGUAL | Status: DC | PRN

## 2015-10-23 MED ORDER — ATORVASTATIN CALCIUM 20 MG PO TABS
80.0000 mg | ORAL_TABLET | Freq: Every day | ORAL | Status: DC
  Administered 2015-10-24 – 2015-10-25 (×2): 80 mg via ORAL
  Filled 2015-10-23 (×3): qty 4

## 2015-10-23 MED ORDER — BIS SUBCIT-METRONID-TETRACYC 140-125-125 MG PO CAPS: 3.0000 | ORAL_CAPSULE | Freq: Three times a day (TID) | ORAL | Status: DC

## 2015-10-23 MED ORDER — SODIUM CHLORIDE 0.9 % IV BOLUS (SEPSIS)
500.0000 mL | Freq: Once | INTRAVENOUS | Status: AC
  Administered 2015-10-23: 500 mL via INTRAVENOUS

## 2015-10-23 MED ORDER — HYDRALAZINE HCL 25 MG PO TABS
25.0000 mg | ORAL_TABLET | Freq: Three times a day (TID) | ORAL | Status: DC
  Administered 2015-10-23 – 2015-10-26 (×7): 25 mg via ORAL
  Filled 2015-10-23 (×8): qty 1

## 2015-10-23 NOTE — ED Notes (Signed)
Patient transported to X-ray 

## 2015-10-23 NOTE — ED Notes (Signed)
Prime page for BP order

## 2015-10-23 NOTE — ED Triage Notes (Signed)
Pt c/o nausea/vomiting x 2 days; several hours ago began having tightness to the center or her chest; history of CABG 2 years ago; pt pale in triage; restless; panting;

## 2015-10-23 NOTE — ED Notes (Signed)
Patient transported to CT 

## 2015-10-23 NOTE — ED Provider Notes (Signed)
[facility] Emergency Department Provider Note   ____________________________________________   First MD Initiated Contact with Patient 10/23/15 0407     (approximate)  I have reviewed the triage vital signs and the nursing notes.   HISTORY  Chief Complaint Chest Pain and Nausea    HPI Jamie Arias is a 60 y.o. female who presents to the ED from home with a chief complaint of chest pain, nausea and vomiting. Patient has a history of hypertension, diabetes, CAD status post CABG, CKD, status post cholecystectomy who has been having nausea and dry heaving for the past 2 days. Several hours prior to arrival patient began to have tightness/pressure to her substernal chest. Symptoms are not radiating and not associated with shortness of breath. Patient did feel clammy. Denies recent fever, chills, abdominal pain, diarrhea. Denies recent travel or trauma. Nothing makes her symptoms better or worse. Patient did try an ODT Zofran as well as Phenergan prior to arrival without relief of symptoms.   Past Medical History:  Diagnosis Date  . Anemia   . CHF (congestive heart failure) ([place])   . Chronic kidney disease    EFGR 31  . Chronic systolic heart failure ([place])    a. Due to ischemic cardiomyopathy. EF as low as 35%, improved to normal s/p CABG; b. echo 07/06/13: EF 55-60%, no RWMAs, mod TR, trivial pericardial effusion not c/w tamponade physiology  . Coronary artery disease    a. NSTEMI 06/2013; b.cath: severe three-vessel CAD w/ EF 30% & mild-mod MR; c. s/p 3 vessel CABG 07/02/13 (LIMA-LAD, SVG-OM, and SVG-RPDA).  . Diabetes mellitus without complication ([place])   . Diabetic neuropathy ([place])   . Hyperlipidemia   . Hypertension   . Hypothyroidism   . Myocardial infarction ([place]) 2015  . Pleural effusion 2015  . Pulmonary hypertension [facility])     Patient Active Problem List   Diagnosis Date Noted  . Acute on chronic kidney failure ([place]) 09/02/2015  . Orthostatic  hypotension 09/02/2015  . Chronic systolic CHF (congestive heart failure) ([place]) 09/02/2015  . Depression 07/27/2015  . Nausea without vomiting 07/07/2015  . Sinusitis 07/07/2015  . Encounter for routine gynecological examination 07/06/2015  . Routine general medical examination at a health care facility 07/06/2015  . Cough 05/25/2015  . Calculus of gallbladder with chronic cholecystitis without obstruction   . Pre-operative cardiovascular examination 11/30/2014  . Bilateral carotid bruits 11/30/2014  . Low magnesium levels 08/10/2013  . Anemia 08/10/2013  . Constipation 08/10/2013  . Postoperative atrial fibrillation ([place]) 07/30/2013  . Coronary artery disease   . CAD (coronary artery disease) 07/02/2013  . Acute systolic heart failure ([place]) 06/30/2013  . NSTEMI (non-ST elevated myocardial infarction) ([place]) 06/29/2013  . Sleep disorder 05/23/2012  . Hypothyroid 04/27/2012  . HTN (hypertension) 04/27/2012  . HLD (hyperlipidemia) 04/27/2012  . Diabetes mellitus, type 2 ([place]) 04/27/2012  . Neuropathy ([place]) 04/27/2012    Past Surgical History:  Procedure Laterality Date  . CATARACT EXTRACTION Bilateral   . CHOLECYSTECTOMY N/A 12/09/2014   Procedure: LAPAROSCOPIC CHOLECYSTECTOMY;  Surgeon: Marlyce [name], MD;  Location: [facility];  Service: General;  Laterality: N/A;  . CORONARY ARTERY BYPASS GRAFT N/A 07/02/2013   Procedure: CORONARY ARTERY BYPASS GRAFTING (CABG);  Surgeon: Ivin [name], MD;  Location: [facility];  Service: Open Heart Surgery;  Laterality: N/A;  CABG x three, using left internal mammary artery and right leg greater saphenous vein harvested endoscopically  . INTRAOPERATIVE TRANSESOPHAGEAL ECHOCARDIOGRAM N/A 07/02/2013   Procedure: INTRAOPERATIVE  TRANSESOPHAGEAL ECHOCARDIOGRAM;  Surgeon: Ivin [name], MD;  Location: [employer];  Service: Open Heart Surgery;  Laterality: N/A;  . THORACENTESIS Left 2015    Prior to Admission medications   Medication Sig Start Date End  Date Taking? Authorizing Provider  amLODipine (NORVASC) 5 MG tablet Take 0.5 tablets (2.5 mg total) by mouth daily. 09/02/15   [name], MD  aspirin EC 81 MG tablet Take 1 tablet (81 mg total) by mouth daily. 11/30/14   Wellington Hampshire, MD  atorvastatin (LIPITOR) 80 MG tablet TAKE ONE TABLET BY MOUTH ONCE DAILY Patient taking differently: TAKE ONE TABLET BY MOUTH ONCE DAILY IN THE AFTERNOON 01/26/15   Wellington Hampshire, MD  bismuth-metronidazole-tetracycline [name]) [phone] MG capsule Take 3 capsules by mouth 4 (four) times daily -  before meals and at bedtime. 08/25/15   Mauri Pole, MD  blood glucose meter kit and supplies KIT Dispense based on patient and insurance preference. Use up to twice daily. (FOR ICD-10 E11.9) 07/01/15   Rubbie Battiest, NP  carvedilol (COREG) 6.25 MG tablet TAKE ONE TABLET BY MOUTH TWICE DAILY 01/03/15   Wellington Hampshire, MD  furosemide (LASIX) 20 MG tablet Take 1 tablet (20 mg total) by mouth daily. 09/09/15   [name], MD  gabapentin (NEURONTIN) 300 MG capsule TAKE ONE CAPSULE BY MOUTH THREE TIMES DAILY 10/17/15   [name], MD  glucose blood test strip Test 4 times daily Dx. E11.9. One Touch ultra strips 04/19/14   Rubbie Battiest, NP  hydrALAZINE (APRESOLINE) 25 MG tablet Take 1 tablet (25 mg total) by mouth 3 (three) times daily. 09/15/15   Wellington Hampshire, MD  insulin aspart protamine - aspart (NOVOLOG 70/30 MIX) (70-30) 100 UNIT/ML FlexPen Inject 0.08 mLs (8 Units total) into the skin 2 (two) times daily. 06/20/15   Rubbie Battiest, NP  levothyroxine (SYNTHROID, LEVOTHROID) 75 MCG tablet Take 1 tablet (75 mcg total) by mouth daily. 07/01/15   Rubbie Battiest, NP  NITROSTAT 0.4 MG SL tablet DISSOLVE ONE TABLET UNDER THE TONGUE EVERY 5 MINUTES AS NEEDED FOR CHEST PAIN.  DO NOT EXCEED A TOTAL OF 3 DOSES IN 15 MINUTES 07/06/15   Wellington Hampshire, MD  omeprazole (PRILOSEC) 40 MG capsule Take 1 capsule (40 mg total) by mouth daily. 08/11/15   Amy S  [place], PA-C  ondansetron (ZOFRAN) 4 MG tablet TAKE ONE TABLET BY MOUTH ONCE DAILY AS NEEDED FOR NAUSEA AND VOMITING 08/31/15   [name], MD  ondansetron (ZOFRAN-ODT) 8 MG disintegrating tablet DISSOLVE ONE TABLET IN MOUTH EVERY 8 HOURS AS NEEDED FOR NAUSEA AND VOMITING 10/17/15   [name], MD  promethazine (PHENERGAN) 12.5 MG tablet TAKE ONE TABLET BY MOUTH EVERY 6 HOURS AS NEEDED FOR NAUSEA AND VOMITING 07/28/15   [name], MD  traZODone (DESYREL) 50 MG tablet TAKE ONE TABLET BY MOUTH AT BEDTIME AS NEEDED FOR SLEEP 10/05/15   [name], MD    Allergies Review of patient's allergies indicates no known allergies.  Family History  Problem Relation Age of Onset  . COPD Mother   . Cancer Mother     Lung  . Pulmonary embolism Father   . Diabetes Father   . Diabetes Paternal Grandfather   . Heart disease Maternal Grandmother   . Colon cancer Neg Hx   . Colon polyps Neg Hx   . Esophageal cancer Neg Hx   . Pancreatic cancer Neg Hx   .  Liver disease Neg Hx     Social History Social History  Substance Use Topics  . Smoking status: Never Smoker  . Smokeless tobacco: Never Used  . Alcohol use No    Review of Systems  Constitutional: No fever/chills Eyes: No visual changes. ENT: No sore throat. Cardiovascular: Positive for chest pain. Respiratory: Denies shortness of breath. Gastrointestinal: No abdominal pain.  Positive for nausea and vomiting.  No diarrhea.  No constipation. Genitourinary: Negative for dysuria. Musculoskeletal: Negative for back pain. Skin: Negative for rash. Neurological: Negative for headaches, focal weakness or numbness.  10-point ROS otherwise negative.  ____________________________________________   PHYSICAL EXAM:  VITAL SIGNS: ED Triage Vitals  Enc Vitals Group     BP 10/23/15 0301 (!) 175/105     Pulse Rate 10/23/15 0301 78     Resp 10/23/15 0301 (!) 42     Temp 10/23/15 0301 97.7 F (36.5 C)     Temp Source  10/23/15 0301 Oral     SpO2 10/23/15 0301 98 %     Weight 10/23/15 0256 200 lb (90.7 kg)     Height 10/23/15 0256 '5\' 4"'$  (1.626 m)     Head Circumference --      Peak Flow --      Pain Score 10/23/15 0256 8     Pain Loc --      Pain Edu? --      Excl. in [place]? --     Constitutional: Alert and oriented. Uncomfortable appearing and in moderate acute distress. Eyes: Conjunctivae are normal. PERRL. EOMI. Head: Atraumatic. Nose: No congestion/rhinnorhea. Mouth/Throat: Mucous membranes are moist.  Oropharynx non-erythematous. Neck: No stridor.   Cardiovascular: Normal rate, regular rhythm. Grossly normal heart sounds.  Good peripheral circulation. Respiratory: Normal respiratory effort.  No retractions. Lungs CTAB. Gastrointestinal: Soft and mildly tender to palpation epigastrium without rebound or guarding. No distention. No abdominal bruits. No CVA tenderness. Musculoskeletal: No lower extremity tenderness nor edema.  No joint effusions. Neurologic:  Normal speech and language. No gross focal neurologic deficits are appreciated.  Skin:  Skin is warm, dry and intact. No rash noted. Psychiatric: Mood and affect are normal. Speech and behavior are normal.  ____________________________________________   LABS (all labs ordered are listed, but only abnormal results are displayed)  Labs Reviewed  BASIC METABOLIC PANEL - Abnormal; Notable for the following:       Result Value   CO2 20 (*)    Glucose, Bld 227 (*)    BUN 28 (*)    Creatinine, Ser 3.36 (*)    Calcium 8.7 (*)    GFR calc non Af Amer 14 (*)    GFR calc Af Amer 16 (*)    All other components within normal limits  CBC - Abnormal; Notable for the following:    RBC 3.34 (*)    Hemoglobin 10.0 (*)    HCT 29.1 (*)    RDW 15.2 (*)    All other components within normal limits  HEPATIC FUNCTION PANEL - Abnormal; Notable for the following:    Total Protein 6.3 (*)    Albumin 2.8 (*)    ALT 11 (*)    Bilirubin, Direct <0.1 (*)      All other components within normal limits  URINALYSIS COMPLETEWITH MICROSCOPIC ([facility] ONLY) - Abnormal; Notable for the following:    Color, Urine YELLOW (*)    APPearance CLEAR (*)    Glucose, UA >500 (*)    Ketones, ur TRACE (*)  Hgb urine dipstick 1+ (*)    Protein, ur >500 (*)    Bacteria, UA RARE (*)    Squamous Epithelial / LPF 0-5 (*)    All other components within normal limits  TROPONIN I  LIPASE, BLOOD   ____________________________________________  EKG  ED ECG REPORT I, Janesia Joswick J, the attending physician, personally viewed and interpreted this ECG.   Date: 10/23/2015  EKG Time: 0311  Rate: 78  Rhythm: normal EKG, normal sinus rhythm  Axis: Normal  Intervals:none  ST&T Change: Nonspecific  ____________________________________________  RADIOLOGY  Chest 2 view (viewed by me, interpreted per Dr. Collins [place]): 1. Unchanged cardiomegaly with small left pleural effusion.  2. Left basilar opacities, likely atelectasis.     CT chest abdomen and pelvis interpreted per Dr. Pascal Lux: 1. Mild pulmonary edema. Anasarca.  2. Chronic small pleural effusions with greater complexity and lower  lobe round atelectasis on the left.  3. No bowel obstruction to explain nausea and vomiting.  4. Aortic Atherosclerosis (ICD10-170.0)  5. Cardiomegaly and possible pulmonary hypertension.    ____________________________________________   PROCEDURES  Procedure(s) performed: None  Procedures  Critical Care performed: Yes, see critical care note(s)   CRITICAL CARE Performed by: Paulette Blanch   Total critical care time: 30 minutes  Critical care time was exclusive of separately billable procedures and treating other patients.  Critical care was necessary to treat or prevent imminent or life-threatening deterioration.  Critical care was time spent personally by me on the following activities: development of treatment plan with patient and/or surrogate as well as nursing,  discussions with consultants, evaluation of patient's response to treatment, examination of patient, obtaining history from patient or surrogate, ordering and performing treatments and interventions, ordering and review of laboratory studies, ordering and review of radiographic studies, pulse oximetry and re-evaluation of patient's condition.  ____________________________________________   INITIAL IMPRESSION / ASSESSMENT AND PLAN / ED COURSE  Pertinent labs & imaging results that were available during my care of the patient were reviewed by me and considered in my medical decision making (see chart for details).  60 year old female with a history of CAD status post CABG, status post cholecystectomy who presents with substernal chest pain. Initial laboratory and x-ray results unremarkable. Patient in moderate distress; received aspirin upon her arrival to the treatment room. Will administer IV analgesia with antiemetic and obtain CT chest/abdomen/pelvis without contrast secondary to patient's kidney dysfunction.  Clinical Course  Value Comment By Time   Updated patient and spouse of CT imaging results. Hepatic panel and lipase are negative. Pain somewhat relieved with morphine. Will apply nitroglycerin paste and discuss with hospitalist to evaluate patient in emergency department for admission. Paulette Blanch, MD 08/20 [phone]  CT Abdomen Pelvis Wo Contrast (Reviewed) Paulette Blanch, MD 08/20 [phone]     ____________________________________________   FINAL CLINICAL IMPRESSION(S) / ED DIAGNOSES  Final diagnoses:  Chest pain, unspecified chest pain type  Non-intractable vomiting with nausea, vomiting of unspecified type  CKD (chronic kidney disease), stage 3 (moderate)  Coronary artery disease involving coronary bypass graft of native heart with unstable angina pectoris ([place])  Essential hypertension      NEW MEDICATIONS STARTED DURING THIS VISIT:  New Prescriptions   No medications on file      Note:  This document was prepared using Dragon voice recognition software and may include unintentional dictation errors.    Paulette Blanch, MD 10/23/15 [phone]

## 2015-10-23 NOTE — H&P (Signed)
[facility] [place] NAME: Jamie Arias    MR#:  [id number]  DATE OF BIRTH:  11/12/1955  DATE OF ADMISSION:  10/23/2015  PRIMARY CARE PHYSICIAN: [name], MD   REQUESTING/REFERRING PHYSICIAN:   CHIEF COMPLAINT:   Chief Complaint  Patient presents with  . Chest Pain  . Nausea    HISTORY OF PRESENT ILLNESS: Jamie Arias  is a 60 y.o. female with a known history of Chronic kidney disease stage III, congestive heart failure, cardiomyopathy with EF of 35%, coronary artery disease with CABG, diabetes mellitus type 2, hyperlipidemia, hypertension presented to the emergency room with chest pain. The chest pain started around 7 PM yesterday evening and she was watching television at home . The pain is located substernally, height is 4 out of 10 on a scale of 1-10. The pain is pressure-like sensation. Patient did not take blood pressure medication for the last few months and she presented to the emergency room today her blood pressure was high. She also complains of dry heaves and also felt nauseated and had an episode of emesis. Patient was worked up with CT abdomen and chest which did not show any acute abnormality. Her first set of troponin was negative. Hospitalist service was consulted for further care of the patient. No complaints of any shortness of breath or orthopnea.  PAST MEDICAL HISTORY:   Past Medical History:  Diagnosis Date  . Anemia   . CHF (congestive heart failure) ([place])   . Chronic kidney disease    EFGR 31  . Chronic systolic heart failure ([place])    a. Due to ischemic cardiomyopathy. EF as low as 35%, improved to normal s/p CABG; b. echo 07/06/13: EF 55-60%, no RWMAs, mod TR, trivial pericardial effusion not c/w tamponade physiology  . Coronary artery disease    a. NSTEMI 06/2013; b.cath: severe three-vessel CAD w/ EF 30% & mild-mod MR; c. s/p 3 vessel CABG 07/02/13 (LIMA-LAD, SVG-OM, and SVG-RPDA).  . Diabetes mellitus  without complication ([place])   . Diabetic neuropathy ([place])   . Hyperlipidemia   . Hypertension   . Hypothyroidism   . Myocardial infarction ([place]) 2015  . Pleural effusion 2015  . Pulmonary hypertension ([place])     PAST SURGICAL HISTORY: Past Surgical History:  Procedure Laterality Date  . CATARACT EXTRACTION Bilateral   . CHOLECYSTECTOMY N/A 12/09/2014   Procedure: LAPAROSCOPIC CHOLECYSTECTOMY;  Surgeon: Marlyce [name], MD;  Location: [facility];  Service: General;  Laterality: N/A;  . CORONARY ARTERY BYPASS GRAFT N/A 07/02/2013   Procedure: CORONARY ARTERY BYPASS GRAFTING (CABG);  Surgeon: Ivin [name], MD;  Location: [facility];  Service: Open Heart Surgery;  Laterality: N/A;  CABG x three, using left internal mammary artery and right leg greater saphenous vein harvested endoscopically  . INTRAOPERATIVE TRANSESOPHAGEAL ECHOCARDIOGRAM N/A 07/02/2013   Procedure: INTRAOPERATIVE TRANSESOPHAGEAL ECHOCARDIOGRAM;  Surgeon: Ivin [name], MD;  Location: [facility];  Service: Open Heart Surgery;  Laterality: N/A;  . THORACENTESIS Left 2015    SOCIAL HISTORY:  Social History  Substance Use Topics  . Smoking status: Never Smoker  . Smokeless tobacco: Never Used  . Alcohol use No    FAMILY HISTORY:  Family History  Problem Relation Age of Onset  . COPD Mother   . Cancer Mother     Lung  . Pulmonary embolism Father   . Diabetes Father   . Diabetes Paternal Grandfather   . Heart disease Maternal Grandmother   .  Colon cancer Neg Hx   . Colon polyps Neg Hx   . Esophageal cancer Neg Hx   . Pancreatic cancer Neg Hx   . Liver disease Neg Hx     DRUG ALLERGIES: No Known Allergies  REVIEW OF SYSTEMS:   CONSTITUTIONAL: No fever, fatigue or weakness.  EYES: No blurred or double vision.  EARS, NOSE, AND THROAT: No tinnitus or ear pain.  RESPIRATORY: No cough, shortness of breath, wheezing or hemoptysis.  CARDIOVASCULAR: Has chest pain, no orthopnea, edema.  GASTROINTESTINAL: Has nausea,  dry heaves  no vomiting, diarrhea or abdominal pain.  GENITOURINARY: No dysuria, hematuria.  ENDOCRINE: No polyuria, nocturia,  HEMATOLOGY: No anemia, easy bruising or bleeding SKIN: No rash or lesion. MUSCULOSKELETAL: No joint pain or arthritis.   NEUROLOGIC: No tingling, numbness, weakness.  PSYCHIATRY: No anxiety or depression.   MEDICATIONS AT HOME:  Prior to Admission medications   Medication Sig Start Date End Date Taking? Authorizing Provider  amLODipine (NORVASC) 5 MG tablet Take 0.5 tablets (2.5 mg total) by mouth daily. 09/02/15   [name], MD  aspirin EC 81 MG tablet Take 1 tablet (81 mg total) by mouth daily. 11/30/14   Wellington Hampshire, MD  atorvastatin (LIPITOR) 80 MG tablet TAKE ONE TABLET BY MOUTH ONCE DAILY Patient taking differently: TAKE ONE TABLET BY MOUTH ONCE DAILY IN THE AFTERNOON 01/26/15   Wellington Hampshire, MD  bismuth-metronidazole-tetracycline [facility]) [phone] MG capsule Take 3 capsules by mouth 4 (four) times daily -  before meals and at bedtime. 08/25/15   Mauri Pole, MD  blood glucose meter kit and supplies KIT Dispense based on patient and insurance preference. Use up to twice daily. (FOR ICD-10 E11.9) 07/01/15   Rubbie Battiest, NP  carvedilol (COREG) 6.25 MG tablet TAKE ONE TABLET BY MOUTH TWICE DAILY 01/03/15   Wellington Hampshire, MD  furosemide (LASIX) 20 MG tablet Take 1 tablet (20 mg total) by mouth daily. 09/09/15   [name], MD  gabapentin (NEURONTIN) 300 MG capsule TAKE ONE CAPSULE BY MOUTH THREE TIMES DAILY 10/17/15   [name], MD  glucose blood test strip Test 4 times daily Dx. E11.9. One Touch ultra strips 04/19/14   Rubbie Battiest, NP  hydrALAZINE (APRESOLINE) 25 MG tablet Take 1 tablet (25 mg total) by mouth 3 (three) times daily. 09/15/15   Wellington Hampshire, MD  insulin aspart protamine - aspart (NOVOLOG 70/30 MIX) (70-30) 100 UNIT/ML FlexPen Inject 0.08 mLs (8 Units total) into the skin 2 (two) times daily. 06/20/15    Rubbie Battiest, NP  levothyroxine (SYNTHROID, LEVOTHROID) 75 MCG tablet Take 1 tablet (75 mcg total) by mouth daily. 07/01/15   Rubbie Battiest, NP  NITROSTAT 0.4 MG SL tablet DISSOLVE ONE TABLET UNDER THE TONGUE EVERY 5 MINUTES AS NEEDED FOR CHEST PAIN.  DO NOT EXCEED A TOTAL OF 3 DOSES IN 15 MINUTES 07/06/15   Wellington Hampshire, MD  omeprazole (PRILOSEC) 40 MG capsule Take 1 capsule (40 mg total) by mouth daily. 08/11/15   Amy S Esterwood, PA-C  ondansetron (ZOFRAN) 4 MG tablet TAKE ONE TABLET BY MOUTH ONCE DAILY AS NEEDED FOR NAUSEA AND VOMITING 08/31/15   [name], MD  ondansetron (ZOFRAN-ODT) 8 MG disintegrating tablet DISSOLVE ONE TABLET IN MOUTH EVERY 8 HOURS AS NEEDED FOR NAUSEA AND VOMITING 10/17/15   [name], MD  promethazine (PHENERGAN) 12.5 MG tablet TAKE ONE TABLET BY MOUTH EVERY 6 HOURS AS NEEDED FOR  NAUSEA AND VOMITING 07/28/15   [name], MD  traZODone (DESYREL) 50 MG tablet TAKE ONE TABLET BY MOUTH AT BEDTIME AS NEEDED FOR SLEEP 10/05/15   [name], MD      PHYSICAL EXAMINATION:   VITAL SIGNS: Blood pressure (!) 214/88, pulse 90, temperature 97.7 F (36.5 C), temperature source Oral, resp. rate 14, height '5\' 4"'$  (1.626 m), weight 90.7 kg (200 lb), SpO2 98 %.  GENERAL:  60 y.o.-year-old patient lying in the bed with no acute distress.  EYES: Pupils equal, round, reactive to light and accommodation. No scleral icterus. Extraocular muscles intact.  HEENT: Head atraumatic, normocephalic. Oropharynx and nasopharynx clear.  NECK:  Supple, no jugular venous distention. No thyroid enlargement, no tenderness.  LUNGS: Normal breath sounds bilaterally, no wheezing, rales,rhonchi or crepitation. No use of accessory muscles of respiration.  o dullness to percussion. CARDIOVASCULAR: S1, S2 normal. No murmurs, rubs, or gallops.  ABDOMEN: Soft, nontender, nondistended. Bowel sounds present. No organomegaly or mass.  EXTREMITIES: No pedal edema, cyanosis, or clubbing.   NEUROLOGIC: Cranial nerves II through XII are intact. Muscle strength 5/5 in all extremities. Sensation intact. Gait not checked.  PSYCHIATRIC: The patient is alert and oriented x 3.  SKIN: No obvious rash, lesion, or ulcer.   LABORATORY PANEL:   CBC  Recent Labs Lab 10/23/15 0312  WBC 8.5  HGB 10.0*  HCT 29.1*  PLT 232  MCV 87.2  MCH 30.0  MCHC 34.4  RDW 15.2*   ------------------------------------------------------------------------------------------------------------------  Chemistries   Recent Labs Lab 10/23/15 0312  NA 135  K 3.9  CL 105  CO2 20*  GLUCOSE 227*  BUN 28*  CREATININE 3.36*  CALCIUM 8.7*  AST 23  ALT 11*  ALKPHOS 66  BILITOT 0.6   ------------------------------------------------------------------------------------------------------------------ estimated creatinine clearance is 19.2 mL/min (by C-G formula based on SCr of 3.36 mg/dL). ------------------------------------------------------------------------------------------------------------------ No results for input(s): TSH, T4TOTAL, T3FREE, THYROIDAB in the last 72 hours.  Invalid input(s): FREET3   Coagulation profile No results for input(s): INR, PROTIME in the last 168 hours. ------------------------------------------------------------------------------------------------------------------- No results for input(s): DDIMER in the last 72 hours. -------------------------------------------------------------------------------------------------------------------  Cardiac Enzymes  Recent Labs Lab 10/23/15 0312  TROPONINI <0.03   ------------------------------------------------------------------------------------------------------------------ Invalid input(s): POCBNP  ---------------------------------------------------------------------------------------------------------------  Urinalysis    Component Value Date/Time   COLORURINE YELLOW (A) 10/23/2015 0503   APPEARANCEUR CLEAR (A)  10/23/2015 0503   APPEARANCEUR Hazy 07/23/2013 0741   LABSPEC 1.013 10/23/2015 0503   LABSPEC 1.016 07/23/2013 0741   PHURINE 7.0 10/23/2015 0503   GLUCOSEU >500 (A) 10/23/2015 0503   GLUCOSEU Negative 07/23/2013 0741   HGBUR 1+ (A) 10/23/2015 0503   BILIRUBINUR NEGATIVE 10/23/2015 0503   BILIRUBINUR Negative 07/23/2013 0741   KETONESUR TRACE (A) 10/23/2015 0503   PROTEINUR >500 (A) 10/23/2015 0503   UROBILINOGEN 0.2 06/30/2013 2213   NITRITE NEGATIVE 10/23/2015 0503   LEUKOCYTESUR NEGATIVE 10/23/2015 0503   LEUKOCYTESUR Negative 07/23/2013 0741     RADIOLOGY: Ct Abdomen Pelvis Wo Contrast  Result Date: 10/23/2015 CLINICAL DATA:  Nausea and vomiting for 2 days. Substernal chest pain. EXAM: CT CHEST, ABDOMEN AND PELVIS WITHOUT CONTRAST TECHNIQUE: Multidetector CT imaging of the chest, abdomen and pelvis was performed following the standard protocol without IV contrast. COMPARISON:  Abdominal CT 03/08/2015.  Chest CT 07/19/2013 FINDINGS: CT CHEST FINDINGS Cardiovascular: Chronic cardiomegaly. No pericardial effusion. Status post CABG. No evidence of acute vascular disease. Dilated main pulmonary artery at 39 mm compatible with pulmonary hypertension. Mediastinum:  Negative for adenopathy.  Lungs/Pleura: Mild septal thickening greatest at the bases. Mild atelectasis. Small pleural effusions with fissural component and pleural thickening on the left. Right pleural effusion is small and layering. Pleural fluid is stable from prior. There is a multi segment left lower lobe opacity with distorted interstitium that has a curvilinear appearance on coronal reformats. Findings are stable from prior and consistent with round atelectasis. Musculoskeletal: No acute or aggressive osseous finding.  Anasarca CT ABDOMEN PELVIS FINDINGS Lower chest and abdominal wall:  Anasarca Hepatobiliary: No focal liver abnormality.Cholecystectomy with normal common bile duct diameter. Pancreas: Unremarkable. Spleen:  Unremarkable. Adrenals/Urinary Tract: Negative adrenals. No hydronephrosis or stone. Unremarkable bladder. Stomach/Bowel: No obstruction. No convincing bowel wall thickening when accounting for under distended segments of colon. No appendicitis. Reproductive:No pathologic findings. Vascular/Lymphatic: No acute vascular abnormality. No mass or adenopathy. Other: No ascites or pneumoperitoneum. Musculoskeletal: No acute abnormalities. IMPRESSION: 1. Mild pulmonary edema.  Anasarca. 2. Chronic small pleural effusions with greater complexity and lower lobe round atelectasis on the left. 3. No bowel obstruction to explain nausea and vomiting. 4.  Aortic Atherosclerosis (ICD10-170.0) 5. Cardiomegaly and possible pulmonary hypertension. Electronically Signed   By: Monte Fantasia M.D.   On: 10/23/2015 05:04   Dg Chest 2 View  Result Date: 10/23/2015 CLINICAL DATA:  Nausea and vomiting. Chest pain. Shortness of breath. EXAM: CHEST  2 VIEW COMPARISON:  Chest radiograph 07/22/2015 FINDINGS: Unchanged CABG markers and median sternotomy wires. Persistent cardiomegaly. Small left pleural effusion. Left basilar opacities. No other area of consolidation. No pulmonary edema. No pneumothorax. IMPRESSION: 1. Unchanged cardiomegaly with small left pleural effusion. 2. Left basilar opacities, likely atelectasis. Electronically Signed   By: Ulyses Jarred M.D.   On: 10/23/2015 03:54   Ct Chest Wo Contrast  Result Date: 10/23/2015 CLINICAL DATA:  Nausea and vomiting for 2 days. Substernal chest pain. EXAM: CT CHEST, ABDOMEN AND PELVIS WITHOUT CONTRAST TECHNIQUE: Multidetector CT imaging of the chest, abdomen and pelvis was performed following the standard protocol without IV contrast. COMPARISON:  Abdominal CT 03/08/2015.  Chest CT 07/19/2013 FINDINGS: CT CHEST FINDINGS Cardiovascular: Chronic cardiomegaly. No pericardial effusion. Status post CABG. No evidence of acute vascular disease. Dilated main pulmonary artery at 39 mm  compatible with pulmonary hypertension. Mediastinum:  Negative for adenopathy. Lungs/Pleura: Mild septal thickening greatest at the bases. Mild atelectasis. Small pleural effusions with fissural component and pleural thickening on the left. Right pleural effusion is small and layering. Pleural fluid is stable from prior. There is a multi segment left lower lobe opacity with distorted interstitium that has a curvilinear appearance on coronal reformats. Findings are stable from prior and consistent with round atelectasis. Musculoskeletal: No acute or aggressive osseous finding.  Anasarca CT ABDOMEN PELVIS FINDINGS Lower chest and abdominal wall:  Anasarca Hepatobiliary: No focal liver abnormality.Cholecystectomy with normal common bile duct diameter. Pancreas: Unremarkable. Spleen: Unremarkable. Adrenals/Urinary Tract: Negative adrenals. No hydronephrosis or stone. Unremarkable bladder. Stomach/Bowel: No obstruction. No convincing bowel wall thickening when accounting for under distended segments of colon. No appendicitis. Reproductive:No pathologic findings. Vascular/Lymphatic: No acute vascular abnormality. No mass or adenopathy. Other: No ascites or pneumoperitoneum. Musculoskeletal: No acute abnormalities. IMPRESSION: 1. Mild pulmonary edema.  Anasarca. 2. Chronic small pleural effusions with greater complexity and lower lobe round atelectasis on the left. 3. No bowel obstruction to explain nausea and vomiting. 4.  Aortic Atherosclerosis (ICD10-170.0) 5. Cardiomegaly and possible pulmonary hypertension. Electronically Signed   By: Monte Fantasia M.D.   On: 10/23/2015 05:04    EKG: Orders placed  or performed during the hospital encounter of 10/23/15  . ED EKG within 10 minutes  . ED EKG within 10 minutes  . EKG 12-Lead  . EKG 12-Lead    IMPRESSION AND PLAN: 59 year old female patient with chronic kidney disease stage III, hypertension, hyperlipidemia, coronary disease, CABG, systolic heart failure  presented to the emergency room with chest discomfort. Admitting diagnosis 1. Unstable angina 2. Chronic kidney disease stage III 3. Systolic heart failure 4. Uncontrolled hypertension 5. Coronary artery disease 6. Hyperlipidemia Treatment plan Admit patient to telemetry observation bed Aspirin 81 mg daily When necessary nitrates for chest pain Cardiology consultation Check echocardiogram Resume home medications DVT prophylaxis subcutaneous heparin Cycle troponin to rule out ischemia and check echocardiogram Supportive care.  All the records are reviewed and case discussed with ED provider. Management plans discussed with the patient, family and they are in agreement.  CODE STATUS:FULL Code Status History    Date Active Date Inactive Code Status Order ID Comments User Context   09/03/2015  1:30 AM 09/03/2015  4:19 PM Full Code [id number]  Lance Coon, MD Inpatient   07/02/2013  1:27 PM 07/14/2013  3:40 PM Full Code [id number]  Nani Skillern, PA-C Inpatient   06/29/2013  9:53 PM 07/02/2013  1:27 PM Full Code [id number]  Larey Dresser, MD Inpatient       TOTAL TIME TAKING CARE OF THIS PATIENT: 50 minutes.    Saundra Shelling M.D on 10/23/2015 at 6:00 AM  Between 7am to 6pm - Pager - [phone]  After 6pm go to [email or web address] - password [email or web address] [facility]  [facility]  Office  [phone]  CC: Primary care physician; [name], MD

## 2015-10-23 NOTE — Progress Notes (Signed)
[facility]  ROUNDING NOTE   Subjective:  Patient well known to our practice as we follow her CKD stage IV.  She is followed for chronic kidney disease stage IV. Her baseline EGFR appears to be 17 with a creatinine of 2.8. She came to the office on 10/17/2015 and was found to have a creatinine of 3.4 with EGFR of 14. She presented now with chest pain and has also had some episodes of nausea and vomiting at home. Blood pressure was quite elevated upon presentation. She has also been seen by cardiology.   Objective:  Vital signs in last 24 hours:  Temp:  [97.7 F (36.5 C)-98 F (36.7 C)] 98 F (36.7 C) (08/20 0826) Pulse Rate:  [77-94] 94 (08/20 0826) Resp:  [13-42] 28 (08/20 0826) BP: (143-214)/(70-118) 176/98 (08/20 0826) SpO2:  [92 %-100 %] 97 % (08/20 0826) Weight:  [90.7 kg (200 lb)] 90.7 kg (200 lb) (08/20 0256)  Weight change:  Filed Weights   10/23/15 0256  Weight: 90.7 kg (200 lb)    Intake/Output: No intake/output data recorded.   Intake/Output this shift:  Total I/O In: -  Out: 100 [Urine:100]  Physical Exam: General: Mild distress from chest pain and nausesa  Head: Normocephalic, atraumatic. Moist oral mucosal membranes  Eyes: Anicteric  Neck: Supple, trachea midline  Lungs:  Basilar rales, normal effort  Heart: S1S2 no rubs  Abdomen:  Soft, nontender, BS present   Extremities: trace peripheral edema.  Neurologic: Nonfocal, moving all four extremities  Skin: No lesions  Access: none    Basic Metabolic Panel:  Recent Labs Lab 10/23/15 0312 10/23/15 0834  NA 135  --   K 3.9  --   CL 105  --   CO2 20*  --   GLUCOSE 227*  --   BUN 28*  --   CREATININE 3.36* 3.40*  CALCIUM 8.7*  --     Liver Function Tests:  Recent Labs Lab 10/23/15 0312  AST 23  ALT 11*  ALKPHOS 66  BILITOT 0.6  PROT 6.3*  ALBUMIN 2.8*    Recent Labs Lab 10/23/15 0312  LIPASE 12   No results for input(s): AMMONIA in the last 168  hours.  CBC:  Recent Labs Lab 10/23/15 0312  WBC 8.5  HGB 10.0*  HCT 29.1*  MCV 87.2  PLT 232    Cardiac Enzymes:  Recent Labs Lab 10/23/15 0312 10/23/15 0834  TROPONINI <0.03 <0.03    BNP: Invalid input(s): POCBNP  CBG:  Recent Labs Lab 10/23/15 0834 10/23/15 1022  GLUCAP 275* 262*    Microbiology: Results for orders placed or performed during the hospital encounter of 06/29/13  MRSA PCR Screening     Status: None   Collection Time: 06/29/13  8:45 PM  Result Value Ref Range Status   MRSA by PCR NEGATIVE NEGATIVE Final    Comment:        The GeneXpert MRSA Assay (FDA approved for NASAL specimens only), is one component of a comprehensive MRSA colonization surveillance program. It is not intended to diagnose MRSA infection nor to guide or monitor treatment for MRSA infections.  Surgical pcr screen     Status: None   Collection Time: 06/30/13  8:50 AM  Result Value Ref Range Status   MRSA, PCR NEGATIVE NEGATIVE Final   Staphylococcus aureus NEGATIVE NEGATIVE Final    Comment:        The Xpert SA Assay (FDA approved for NASAL specimens in patients over 21 years  of age), is one component of a comprehensive surveillance program.  Test performance has been validated by [employer] for patients greater than or equal to 40 year old. It is not intended to diagnose infection nor to guide or monitor treatment.    Coagulation Studies: No results for input(s): LABPROT, INR in the last 72 hours.  Urinalysis:  Recent Labs  10/23/15 0503  COLORURINE YELLOW*  LABSPEC 1.013  PHURINE 7.0  GLUCOSEU >500*  HGBUR 1+*  BILIRUBINUR NEGATIVE  KETONESUR TRACE*  PROTEINUR >500*  NITRITE NEGATIVE  LEUKOCYTESUR NEGATIVE      Imaging: Ct Abdomen Pelvis Wo Contrast  Result Date: 10/23/2015 CLINICAL DATA:  Nausea and vomiting for 2 days. Substernal chest pain. EXAM: CT CHEST, ABDOMEN AND PELVIS WITHOUT CONTRAST TECHNIQUE: Multidetector CT imaging of the  chest, abdomen and pelvis was performed following the standard protocol without IV contrast. COMPARISON:  Abdominal CT 03/08/2015.  Chest CT 07/19/2013 FINDINGS: CT CHEST FINDINGS Cardiovascular: Chronic cardiomegaly. No pericardial effusion. Status post CABG. No evidence of acute vascular disease. Dilated main pulmonary artery at 39 mm compatible with pulmonary hypertension. Mediastinum:  Negative for adenopathy. Lungs/Pleura: Mild septal thickening greatest at the bases. Mild atelectasis. Small pleural effusions with fissural component and pleural thickening on the left. Right pleural effusion is small and layering. Pleural fluid is stable from prior. There is a multi segment left lower lobe opacity with distorted interstitium that has a curvilinear appearance on coronal reformats. Findings are stable from prior and consistent with round atelectasis. Musculoskeletal: No acute or aggressive osseous finding.  Anasarca CT ABDOMEN PELVIS FINDINGS Lower chest and abdominal wall:  Anasarca Hepatobiliary: No focal liver abnormality.Cholecystectomy with normal common bile duct diameter. Pancreas: Unremarkable. Spleen: Unremarkable. Adrenals/Urinary Tract: Negative adrenals. No hydronephrosis or stone. Unremarkable bladder. Stomach/Bowel: No obstruction. No convincing bowel wall thickening when accounting for under distended segments of colon. No appendicitis. Reproductive:No pathologic findings. Vascular/Lymphatic: No acute vascular abnormality. No mass or adenopathy. Other: No ascites or pneumoperitoneum. Musculoskeletal: No acute abnormalities. IMPRESSION: 1. Mild pulmonary edema.  Anasarca. 2. Chronic small pleural effusions with greater complexity and lower lobe round atelectasis on the left. 3. No bowel obstruction to explain nausea and vomiting. 4.  Aortic Atherosclerosis (ICD10-170.0) 5. Cardiomegaly and possible pulmonary hypertension. Electronically Signed   By: Marnee Spring M.D.   On: 10/23/2015 05:04   Dg  Chest 2 View  Result Date: 10/23/2015 CLINICAL DATA:  Nausea and vomiting. Chest pain. Shortness of breath. EXAM: CHEST  2 VIEW COMPARISON:  Chest radiograph 07/22/2015 FINDINGS: Unchanged CABG markers and median sternotomy wires. Persistent cardiomegaly. Small left pleural effusion. Left basilar opacities. No other area of consolidation. No pulmonary edema. No pneumothorax. IMPRESSION: 1. Unchanged cardiomegaly with small left pleural effusion. 2. Left basilar opacities, likely atelectasis. Electronically Signed   By: Deatra Robinson M.D.   On: 10/23/2015 03:54   Ct Chest Wo Contrast  Result Date: 10/23/2015 CLINICAL DATA:  Nausea and vomiting for 2 days. Substernal chest pain. EXAM: CT CHEST, ABDOMEN AND PELVIS WITHOUT CONTRAST TECHNIQUE: Multidetector CT imaging of the chest, abdomen and pelvis was performed following the standard protocol without IV contrast. COMPARISON:  Abdominal CT 03/08/2015.  Chest CT 07/19/2013 FINDINGS: CT CHEST FINDINGS Cardiovascular: Chronic cardiomegaly. No pericardial effusion. Status post CABG. No evidence of acute vascular disease. Dilated main pulmonary artery at 39 mm compatible with pulmonary hypertension. Mediastinum:  Negative for adenopathy. Lungs/Pleura: Mild septal thickening greatest at the bases. Mild atelectasis. Small pleural effusions with fissural component and pleural  thickening on the left. Right pleural effusion is small and layering. Pleural fluid is stable from prior. There is a multi segment left lower lobe opacity with distorted interstitium that has a curvilinear appearance on coronal reformats. Findings are stable from prior and consistent with round atelectasis. Musculoskeletal: No acute or aggressive osseous finding.  Anasarca CT ABDOMEN PELVIS FINDINGS Lower chest and abdominal wall:  Anasarca Hepatobiliary: No focal liver abnormality.Cholecystectomy with normal common bile duct diameter. Pancreas: Unremarkable. Spleen: Unremarkable. Adrenals/Urinary  Tract: Negative adrenals. No hydronephrosis or stone. Unremarkable bladder. Stomach/Bowel: No obstruction. No convincing bowel wall thickening when accounting for under distended segments of colon. No appendicitis. Reproductive:No pathologic findings. Vascular/Lymphatic: No acute vascular abnormality. No mass or adenopathy. Other: No ascites or pneumoperitoneum. Musculoskeletal: No acute abnormalities. IMPRESSION: 1. Mild pulmonary edema.  Anasarca. 2. Chronic small pleural effusions with greater complexity and lower lobe round atelectasis on the left. 3. No bowel obstruction to explain nausea and vomiting. 4.  Aortic Atherosclerosis (ICD10-170.0) 5. Cardiomegaly and possible pulmonary hypertension. Electronically Signed   By: Monte Fantasia M.D.   On: 10/23/2015 05:04     Medications:     . amLODipine  10 mg Oral Daily  . aspirin  324 mg Oral NOW   Or  . aspirin  300 mg Rectal NOW  . [START ON 10/24/2015] aspirin EC  81 mg Oral Daily  . atorvastatin  80 mg Oral Daily  . metroNIDAZOLE  375 mg Oral TID AC & HS   And  . tetracycline  500 mg Oral TID AC & HS   And  . bismuth subsalicylate  24 mL Oral TID AC & HS  . carvedilol  12.5 mg Oral BID  . furosemide  20 mg Oral Daily  . gabapentin  300 mg Oral TID  . heparin  5,000 Units Subcutaneous Q8H  . hydrALAZINE  25 mg Oral TID  . insulin aspart  0-15 Units Subcutaneous TID WC  . insulin aspart  0-5 Units Subcutaneous QHS  . isosorbide dinitrate  10 mg Oral TID  . levothyroxine  75 mcg Oral QAC breakfast  . metoCLOPramide (REGLAN) injection  10 mg Intravenous Q6H  . pantoprazole  40 mg Oral BID AC  . [START ON 10/24/2015] regadenoson  0.4 mg Intravenous Once  . sodium chloride flush  3 mL Intravenous Q12H  . traZODone  50 mg Oral QHS   sodium chloride, acetaminophen, hydrALAZINE, morphine injection, nitroGLYCERIN, ondansetron (ZOFRAN) IV, sodium chloride flush  Assessment/ Plan:  60 y.o. female with hypertension, diabetes mellitus type  II insulin-dependent, hyperlipidemia, coronary artery disease status post CABG, anemia, congestive heart failure, pulmonary hypertension   1. Acute renal failure/chronic kidney disease stage IV baseline creatinine 2.8 EGFR 17 with suspected diabetic nephropathy. The patient's renal function has worsened over the past month. As above baseline creatinine is 2.8 Hower creatinine currently up to 3.40. Mild pulmonary edema noted on chest CT and abdominal CT.  Will give dose of lasix '60mg'$  IV x one now.   2. Hypertension. Blood pressure quite labile. Blood pressure currently 176/98. Blood pressure has been as low as 143/70. Continue amlodipine, carvedilol, hydralazine for now.  3. Anemia of chronic kidney disease. Hemoglobin currently 10.0. No urgent indication for Epogen at the moment.  4. Chest pain. Further workup and management per cardiology.  LOS: 0 Delois Silvester 8/20/201711:46 AM

## 2015-10-23 NOTE — ED Notes (Signed)
MD notified of high blood pressure. Instructed to give pt 20mg  hydralazine and send pt to floor. Will continue to monitor blood pressure.

## 2015-10-23 NOTE — Consult Note (Signed)
CARDIOLOGY CONSULT NOTE       Patient ID: SHAUNTRELL SIMENTAL MRN: [id number] DOB/AGE: [birth year] 60 y.o.  Admit date: 10/23/2015 Referring Physician: Darvin Neighbours  Primary Physician: [name], MD Primary Cardiologist:  Fletcher Anon Reason for Consultation:  Chest Pain and dyspnea  Principal Problem:   Unstable angina [facility])   HPI:   60 y.o. admitted with chest pressure and dyspnea  History of CAD CABG in 2015  CRF with Cr 3.0, chronic diastolic CHF.  CRF;s DM, HTN and elevated lipids Last echo 04/2014  EF improved 0000000 grade 2 diastolic dysfunction.  Dry weight seems to be around 205 lbs  CABG 07/2013  [place] LIMA to LAD  SVG OM SVG PDA  Had chest pain started around 7 PM yesterday evening and she was watching television at home . The pain is located substernally, height is 4 out of 10 on a scale of 1-10. The pain is pressure-like sensation. Patient did not take blood pressure medication for the last few months and she presented to the emergency room today her blood pressure was high. She also complains of dry heaves and also felt nauseated and had an episode of emesis. Patient was worked up with CT abdomen and chest which did not show any acute abnormality. Her first set of troponin was negative. Chest pain  Was relieved with MSO4  Currently pain free   ROS All other systems reviewed and negative except as noted above  Past Medical History:  Diagnosis Date  . Anemia   . CHF (congestive heart failure) ([place])   . Chronic kidney disease    EFGR 31  . Chronic systolic heart failure ([place])    a. Due to ischemic cardiomyopathy. EF as low as 35%, improved to normal s/p CABG; b. echo 07/06/13: EF 55-60%, no RWMAs, mod TR, trivial pericardial effusion not c/w tamponade physiology  . Coronary artery disease    a. NSTEMI 06/2013; b.cath: severe three-vessel CAD w/ EF 30% & mild-mod MR; c. s/p 3 vessel CABG 07/02/13 (LIMA-LAD, SVG-OM, and SVG-RPDA).  . Diabetes mellitus without complication ([place])     . Diabetic neuropathy ([place])   . Hyperlipidemia   . Hypertension   . Hypothyroidism   . Myocardial infarction ([place]) 2015  . Pleural effusion 2015  . Pulmonary hypertension ([facility])     Family History  Problem Relation Age of Onset  . COPD Mother   . Cancer Mother     Lung  . Pulmonary embolism Father   . Diabetes Father   . Diabetes Paternal Grandfather   . Heart disease Maternal Grandmother   . Colon cancer Neg Hx   . Colon polyps Neg Hx   . Esophageal cancer Neg Hx   . Pancreatic cancer Neg Hx   . Liver disease Neg Hx     Social History   Social History  . Marital status: Married    Spouse name: N/A  . Number of children: N/A  . Years of education: N/A   Occupational History  . works for [employer]  . Smoking status: Never Smoker  . Smokeless tobacco: Never Used  . Alcohol use No  . Drug use: No  . Sexual activity: Not on file   Other Topics Concern  . Not on file   Social History Narrative   Patient lives at home with her husband. Patient has 4 adult children.    Past Surgical History:  Procedure Laterality Date  . CATARACT EXTRACTION Bilateral   .  CHOLECYSTECTOMY N/A 12/09/2014   Procedure: LAPAROSCOPIC CHOLECYSTECTOMY;  Surgeon: Marlyce [name], MD;  Location: [facility];  Service: General;  Laterality: N/A;  . CORONARY ARTERY BYPASS GRAFT N/A 07/02/2013   Procedure: CORONARY ARTERY BYPASS GRAFTING (CABG);  Surgeon: Ivin [name], MD;  Location: [facility];  Service: Open Heart Surgery;  Laterality: N/A;  CABG x three, using left internal mammary artery and right leg greater saphenous vein harvested endoscopically  . INTRAOPERATIVE TRANSESOPHAGEAL ECHOCARDIOGRAM N/A 07/02/2013   Procedure: INTRAOPERATIVE TRANSESOPHAGEAL ECHOCARDIOGRAM;  Surgeon: Ivin [name], MD;  [facility];  Service: Open Heart Surgery;  Laterality: N/A;  . THORACENTESIS Left 2015     . amLODipine  10 mg Oral Daily  . aspirin  324 mg Oral NOW    Or  . aspirin  300 mg Rectal NOW  . [START ON 10/24/2015] aspirin EC  81 mg Oral Daily  . atorvastatin  80 mg Oral Daily  . bismuth-metronidazole-tetracycline  3 capsule Oral TID AC & HS  . carvedilol  12.5 mg Oral BID  . furosemide  20 mg Oral Daily  . gabapentin  300 mg Oral TID  . heparin  5,000 Units Subcutaneous Q8H  . hydrALAZINE  25 mg Oral TID  . insulin aspart  0-15 Units Subcutaneous TID WC  . insulin aspart  0-5 Units Subcutaneous QHS  . levothyroxine  75 mcg Oral QAC breakfast  . metoCLOPramide (REGLAN) injection  10 mg Intravenous Q6H  . pantoprazole  40 mg Oral BID AC  . sodium chloride flush  3 mL Intravenous Q12H  . traZODone  50 mg Oral QHS      Physical Exam: Blood pressure (!) 176/98, pulse 94, temperature 98 F (36.7 C), temperature source Oral, resp. rate (!) 28, height 5\' 4"  (1.626 m), weight 200 lb (90.7 kg), SpO2 97 %.   Affect appropriate Chronically ill white female  HEENT: normal Neck supple with no adenopathy JVP normal no bruits no thyromegaly Lungs clear with no wheezing and good diaphragmatic motion Heart:  S1/S2 no murmur, no rub, gallop or click PMI normal Abdomen: benighn, BS positve, no tenderness, no AAA no bruit.  No HSM or HJR Distal pulses intact with no bruits No edema Neuro non-focal Skin warm and dry No muscular weakness   Labs:   Lab Results  Component Value Date   WBC 8.5 10/23/2015   HGB 10.0 (L) 10/23/2015   HCT 29.1 (L) 10/23/2015   MCV 87.2 10/23/2015   PLT 232 10/23/2015    Recent Labs Lab 10/23/15 0312 10/23/15 0834  NA 135  --   K 3.9  --   CL 105  --   CO2 20*  --   BUN 28*  --   CREATININE 3.36* 3.40*  CALCIUM 8.7*  --   PROT 6.3*  --   BILITOT 0.6  --   ALKPHOS 66  --   ALT 11*  --   AST 23  --   GLUCOSE 227*  --    Lab Results  Component Value Date   CKTOTAL 81 07/18/2013   CKMB 2.0 07/18/2013   TROPONINI <0.03 10/23/2015    Lab Results  Component Value Date   CHOL 194 06/24/2015    CHOL 182 04/13/2014   CHOL 171 12/18/2013   Lab Results  Component Value Date   HDL 64.20 06/24/2015   HDL 70 04/13/2014   HDL 63 12/18/2013   Lab Results  Component Value Date   LDLCALC 114 (H) 06/24/2015  LDLCALC 93 04/13/2014   LDLCALC 93 12/18/2013   Lab Results  Component Value Date   TRIG 77.0 06/24/2015   TRIG 93 04/13/2014   TRIG 77 12/18/2013   Lab Results  Component Value Date   CHOLHDL 3 06/24/2015   CHOLHDL 2.7 12/18/2013   CHOLHDL 4 08/11/2013   Lab Results  Component Value Date   LDLDIRECT 126.9 10/31/2012   LDLDIRECT 168.1 07/25/2012   LDLDIRECT 206.9 04/28/2012      Radiology: Ct Abdomen Pelvis Wo Contrast  Result Date: 10/23/2015 CLINICAL DATA:  Nausea and vomiting for 2 days. Substernal chest pain. EXAM: CT CHEST, ABDOMEN AND PELVIS WITHOUT CONTRAST TECHNIQUE: Multidetector CT imaging of the chest, abdomen and pelvis was performed following the standard protocol without IV contrast. COMPARISON:  Abdominal CT 03/08/2015.  Chest CT 07/19/2013 FINDINGS: CT CHEST FINDINGS Cardiovascular: Chronic cardiomegaly. No pericardial effusion. Status post CABG. No evidence of acute vascular disease. Dilated main pulmonary artery at 39 mm compatible with pulmonary hypertension. Mediastinum:  Negative for adenopathy. Lungs/Pleura: Mild septal thickening greatest at the bases. Mild atelectasis. Small pleural effusions with fissural component and pleural thickening on the left. Right pleural effusion is small and layering. Pleural fluid is stable from prior. There is a multi segment left lower lobe opacity with distorted interstitium that has a curvilinear appearance on coronal reformats. Findings are stable from prior and consistent with round atelectasis. Musculoskeletal: No acute or aggressive osseous finding.  Anasarca CT ABDOMEN PELVIS FINDINGS Lower chest and abdominal wall:  Anasarca Hepatobiliary: No focal liver abnormality.Cholecystectomy with normal common bile duct  diameter. Pancreas: Unremarkable. Spleen: Unremarkable. Adrenals/Urinary Tract: Negative adrenals. No hydronephrosis or stone. Unremarkable bladder. Stomach/Bowel: No obstruction. No convincing bowel wall thickening when accounting for under distended segments of colon. No appendicitis. Reproductive:No pathologic findings. Vascular/Lymphatic: No acute vascular abnormality. No mass or adenopathy. Other: No ascites or pneumoperitoneum. Musculoskeletal: No acute abnormalities. IMPRESSION: 1. Mild pulmonary edema.  Anasarca. 2. Chronic small pleural effusions with greater complexity and lower lobe round atelectasis on the left. 3. No bowel obstruction to explain nausea and vomiting. 4.  Aortic Atherosclerosis (ICD10-170.0) 5. Cardiomegaly and possible pulmonary hypertension. Electronically Signed   By: Monte Fantasia M.D.   On: 10/23/2015 05:04   Dg Chest 2 View  Result Date: 10/23/2015 CLINICAL DATA:  Nausea and vomiting. Chest pain. Shortness of breath. EXAM: CHEST  2 VIEW COMPARISON:  Chest radiograph 07/22/2015 FINDINGS: Unchanged CABG markers and median sternotomy wires. Persistent cardiomegaly. Small left pleural effusion. Left basilar opacities. No other area of consolidation. No pulmonary edema. No pneumothorax. IMPRESSION: 1. Unchanged cardiomegaly with small left pleural effusion. 2. Left basilar opacities, likely atelectasis. Electronically Signed   By: Ulyses Jarred M.D.   On: 10/23/2015 03:54   Ct Chest Wo Contrast  Result Date: 10/23/2015 CLINICAL DATA:  Nausea and vomiting for 2 days. Substernal chest pain. EXAM: CT CHEST, ABDOMEN AND PELVIS WITHOUT CONTRAST TECHNIQUE: Multidetector CT imaging of the chest, abdomen and pelvis was performed following the standard protocol without IV contrast. COMPARISON:  Abdominal CT 03/08/2015.  Chest CT 07/19/2013 FINDINGS: CT CHEST FINDINGS Cardiovascular: Chronic cardiomegaly. No pericardial effusion. Status post CABG. No evidence of acute vascular disease.  Dilated main pulmonary artery at 39 mm compatible with pulmonary hypertension. Mediastinum:  Negative for adenopathy. Lungs/Pleura: Mild septal thickening greatest at the bases. Mild atelectasis. Small pleural effusions with fissural component and pleural thickening on the left. Right pleural effusion is small and layering. Pleural fluid is stable from prior. There is  a multi segment left lower lobe opacity with distorted interstitium that has a curvilinear appearance on coronal reformats. Findings are stable from prior and consistent with round atelectasis. Musculoskeletal: No acute or aggressive osseous finding.  Anasarca CT ABDOMEN PELVIS FINDINGS Lower chest and abdominal wall:  Anasarca Hepatobiliary: No focal liver abnormality.Cholecystectomy with normal common bile duct diameter. Pancreas: Unremarkable. Spleen: Unremarkable. Adrenals/Urinary Tract: Negative adrenals. No hydronephrosis or stone. Unremarkable bladder. Stomach/Bowel: No obstruction. No convincing bowel wall thickening when accounting for under distended segments of colon. No appendicitis. Reproductive:No pathologic findings. Vascular/Lymphatic: No acute vascular abnormality. No mass or adenopathy. Other: No ascites or pneumoperitoneum. Musculoskeletal: No acute abnormalities. IMPRESSION: 1. Mild pulmonary edema.  Anasarca. 2. Chronic small pleural effusions with greater complexity and lower lobe round atelectasis on the left. 3. No bowel obstruction to explain nausea and vomiting. 4.  Aortic Atherosclerosis (ICD10-170.0) 5. Cardiomegaly and possible pulmonary hypertension. Electronically Signed   By: Monte Fantasia M.D.   On: 10/23/2015 05:04    EKG:  SR rate 78 nonspecific ST changes    ASSESSMENT AND PLAN:   Chest Pain:  CAD with CABG 2015 some issues with medical compliance and elevated BP on admission. R/O no acute ECG changes. Will order lexiscan myovue for am. No cath unless high risk finding given CRF.  Add nitrates   Dyspnea:   Echo just done will review With SEMI 2015 before CABG EF 35% and then recovered CT with small chronic Pleural effusion  Check BNP  CRF:  Major issue complicating care and contributing to diastolic dysfunction and anemia.     SignedJenkins Rouge 10/23/2015, 11:27 AM

## 2015-10-23 NOTE — Progress Notes (Addendum)
[facility] [place] NAME: Jamie Arias    MR#:  [id number]  DATE OF BIRTH:  1956/01/09  SUBJECTIVE:  CHIEF COMPLAINT:   Chief Complaint  Patient presents with  . Chest Pain  . Nausea   Patient in severe distress due to pain in her chest and dry heaving. Feels very weak and fatigued. Blood pressure is elevated in the systolic of [id number].  REVIEW OF SYSTEMS:    Review of Systems  Constitutional: Positive for malaise/fatigue. Negative for chills and fever.  HENT: Positive for sore throat.   Eyes: Negative for blurred vision, double vision and pain.  Respiratory: Negative for cough, hemoptysis, shortness of breath and wheezing.   Cardiovascular: Positive for chest pain. Negative for palpitations, orthopnea and leg swelling.  Gastrointestinal: Positive for abdominal pain, heartburn, nausea and vomiting. Negative for constipation and diarrhea.  Genitourinary: Negative for dysuria and hematuria.  Musculoskeletal: Negative for back pain and joint pain.  Skin: Negative for rash.  Neurological: Positive for weakness. Negative for sensory change, speech change, focal weakness and headaches.  Endo/Heme/Allergies: Does not bruise/bleed easily.  Psychiatric/Behavioral: Negative for depression. The patient is not nervous/anxious.      DRUG ALLERGIES:  No Known Allergies  VITALS:  Blood pressure (!) 176/98, pulse 94, temperature 98 F (36.7 C), temperature source Oral, resp. rate (!) 28, height 5\' 4"  (1.626 m), weight 90.7 kg (200 lb), SpO2 97 %.  PHYSICAL EXAMINATION:   Physical Exam  GENERAL:  60 y.o.-year-old patient lying in the bed with Severe distress due to pain and vomiting EYES: Pupils equal, round, reactive to light and accommodation. No scleral icterus. Extraocular muscles intact.  HEENT: Head atraumatic, normocephalic. Oropharynx and nasopharynx clear.  NECK:  Supple, no jugular venous distention. No thyroid enlargement, no  tenderness.  LUNGS: Normal breath sounds bilaterally, no wheezing, rales, rhonchi. No use of accessory muscles of respiration.  CARDIOVASCULAR: S1, S2 normal. No murmurs, rubs, or gallops.  ABDOMEN: Soft, nontender, nondistended. Bowel sounds present. No organomegaly or mass.  EXTREMITIES: No cyanosis, clubbing or edema b/l.    NEUROLOGIC: Cranial nerves II through XII are intact. No focal Motor or sensory deficits b/l.   PSYCHIATRIC: The patient is alert and oriented x 3.  SKIN: No obvious rash, lesion, or ulcer.   LABORATORY PANEL:   CBC  Recent Labs Lab 10/23/15 0312  WBC 8.5  HGB 10.0*  HCT 29.1*  PLT 232   ------------------------------------------------------------------------------------------------------------------ Chemistries   Recent Labs Lab 10/23/15 0312 10/23/15 0834  NA 135  --   K 3.9  --   CL 105  --   CO2 20*  --   GLUCOSE 227*  --   BUN 28*  --   CREATININE 3.36* 3.40*  CALCIUM 8.7*  --   AST 23  --   ALT 11*  --   ALKPHOS 66  --   BILITOT 0.6  --    ------------------------------------------------------------------------------------------------------------------  Cardiac Enzymes  Recent Labs Lab 10/23/15 0834  TROPONINI <0.03   ------------------------------------------------------------------------------------------------------------------  RADIOLOGY:  Ct Abdomen Pelvis Wo Contrast  Result Date: 10/23/2015 CLINICAL DATA:  Nausea and vomiting for 2 days. Substernal chest pain. EXAM: CT CHEST, ABDOMEN AND PELVIS WITHOUT CONTRAST TECHNIQUE: Multidetector CT imaging of the chest, abdomen and pelvis was performed following the standard protocol without IV contrast. COMPARISON:  Abdominal CT 03/08/2015.  Chest CT 07/19/2013 FINDINGS: CT CHEST FINDINGS Cardiovascular: Chronic cardiomegaly. No pericardial effusion. Status post CABG. No evidence of acute  vascular disease. Dilated main pulmonary artery at 39 mm compatible with pulmonary hypertension.  Mediastinum:  Negative for adenopathy. Lungs/Pleura: Mild septal thickening greatest at the bases. Mild atelectasis. Small pleural effusions with fissural component and pleural thickening on the left. Right pleural effusion is small and layering. Pleural fluid is stable from prior. There is a multi segment left lower lobe opacity with distorted interstitium that has a curvilinear appearance on coronal reformats. Findings are stable from prior and consistent with round atelectasis. Musculoskeletal: No acute or aggressive osseous finding.  Anasarca CT ABDOMEN PELVIS FINDINGS Lower chest and abdominal wall:  Anasarca Hepatobiliary: No focal liver abnormality.Cholecystectomy with normal common bile duct diameter. Pancreas: Unremarkable. Spleen: Unremarkable. Adrenals/Urinary Tract: Negative adrenals. No hydronephrosis or stone. Unremarkable bladder. Stomach/Bowel: No obstruction. No convincing bowel wall thickening when accounting for under distended segments of colon. No appendicitis. Reproductive:No pathologic findings. Vascular/Lymphatic: No acute vascular abnormality. No mass or adenopathy. Other: No ascites or pneumoperitoneum. Musculoskeletal: No acute abnormalities. IMPRESSION: 1. Mild pulmonary edema.  Anasarca. 2. Chronic small pleural effusions with greater complexity and lower lobe round atelectasis on the left. 3. No bowel obstruction to explain nausea and vomiting. 4.  Aortic Atherosclerosis (ICD10-170.0) 5. Cardiomegaly and possible pulmonary hypertension. Electronically Signed   By: Monte Fantasia M.D.   On: 10/23/2015 05:04   Dg Chest 2 View  Result Date: 10/23/2015 CLINICAL DATA:  Nausea and vomiting. Chest pain. Shortness of breath. EXAM: CHEST  2 VIEW COMPARISON:  Chest radiograph 07/22/2015 FINDINGS: Unchanged CABG markers and median sternotomy wires. Persistent cardiomegaly. Small left pleural effusion. Left basilar opacities. No other area of consolidation. No pulmonary edema. No pneumothorax.  IMPRESSION: 1. Unchanged cardiomegaly with small left pleural effusion. 2. Left basilar opacities, likely atelectasis. Electronically Signed   By: Ulyses Jarred M.D.   On: 10/23/2015 03:54   Ct Chest Wo Contrast  Result Date: 10/23/2015 CLINICAL DATA:  Nausea and vomiting for 2 days. Substernal chest pain. EXAM: CT CHEST, ABDOMEN AND PELVIS WITHOUT CONTRAST TECHNIQUE: Multidetector CT imaging of the chest, abdomen and pelvis was performed following the standard protocol without IV contrast. COMPARISON:  Abdominal CT 03/08/2015.  Chest CT 07/19/2013 FINDINGS: CT CHEST FINDINGS Cardiovascular: Chronic cardiomegaly. No pericardial effusion. Status post CABG. No evidence of acute vascular disease. Dilated main pulmonary artery at 39 mm compatible with pulmonary hypertension. Mediastinum:  Negative for adenopathy. Lungs/Pleura: Mild septal thickening greatest at the bases. Mild atelectasis. Small pleural effusions with fissural component and pleural thickening on the left. Right pleural effusion is small and layering. Pleural fluid is stable from prior. There is a multi segment left lower lobe opacity with distorted interstitium that has a curvilinear appearance on coronal reformats. Findings are stable from prior and consistent with round atelectasis. Musculoskeletal: No acute or aggressive osseous finding.  Anasarca CT ABDOMEN PELVIS FINDINGS Lower chest and abdominal wall:  Anasarca Hepatobiliary: No focal liver abnormality.Cholecystectomy with normal common bile duct diameter. Pancreas: Unremarkable. Spleen: Unremarkable. Adrenals/Urinary Tract: Negative adrenals. No hydronephrosis or stone. Unremarkable bladder. Stomach/Bowel: No obstruction. No convincing bowel wall thickening when accounting for under distended segments of colon. No appendicitis. Reproductive:No pathologic findings. Vascular/Lymphatic: No acute vascular abnormality. No mass or adenopathy. Other: No ascites or pneumoperitoneum. Musculoskeletal:  No acute abnormalities. IMPRESSION: 1. Mild pulmonary edema.  Anasarca. 2. Chronic small pleural effusions with greater complexity and lower lobe round atelectasis on the left. 3. No bowel obstruction to explain nausea and vomiting. 4.  Aortic Atherosclerosis (ICD10-170.0) 5. Cardiomegaly and possible pulmonary hypertension. Electronically  Signed   By: Monte Fantasia M.D.   On: 10/23/2015 05:04     ASSESSMENT AND PLAN:   * Accelerated hypertension Resume Coreg and hydralazine. Stat dose of IV 10 mg labetalol. Monitor on telemetry.  * Vomiting likely secondary to gastroparesis Lipase normal. CT scan of the abdomen pelvis showed nothing acute. We'll treat with scheduled Reglan. Zofran as needed  * Chest pain likely due to GERD and gastroparesis Cardiology consulted. Troponin 2 normal. EKG shows nothing acute.  *  acute kidney injury over Chronic kidney disease stage III Discussed with Dr. Holley Raring. Could be due to accelerated hypertension. Progressively worsening CKD. Monitor input and output. Stop IV fluids. Repeat labs in the morning.  *  chronic Systolic heart failure No signs of fluid overload.  * Coronary artery disease  * Hyperlipidemia  All the records are reviewed and case discussed with Care Management/Social Workerr. Management plans discussed with the patient, family and they are in agreement.  CODE STATUS: FULL CODE  DVT Prophylaxis: SCDs  TOTAL CC TIME TAKING CARE OF THIS PATIENT: 35 minutes.   POSSIBLE D/C IN 40 DAYS, DEPENDING ON CLINICAL CONDITION.  Hillary Bow R M.D on 10/23/2015 at 11:59 AM  Between 7am to 6pm - Pager - [phone]  After 6pm go to [email or web address] - password [email or web address] [facility]  Office  [phone]  CC: Primary care physician; [name], MD  Note: This dictation was prepared with Dragon dictation along with smaller phrase technology. Any transcriptional errors that result from this process are  unintentional.

## 2015-10-24 ENCOUNTER — Observation Stay (HOSPITAL_BASED_OUTPATIENT_CLINIC_OR_DEPARTMENT_OTHER): Payer: BLUE CROSS/BLUE SHIELD

## 2015-10-24 ENCOUNTER — Encounter: Payer: Self-pay | Admitting: Radiology

## 2015-10-24 DIAGNOSIS — I119 Hypertensive heart disease without heart failure: Secondary | ICD-10-CM

## 2015-10-24 DIAGNOSIS — R079 Chest pain, unspecified: Secondary | ICD-10-CM

## 2015-10-24 DIAGNOSIS — I5032 Chronic diastolic (congestive) heart failure: Secondary | ICD-10-CM | POA: Diagnosis not present

## 2015-10-24 DIAGNOSIS — N183 Chronic kidney disease, stage 3 unspecified: Secondary | ICD-10-CM

## 2015-10-24 DIAGNOSIS — I200 Unstable angina: Secondary | ICD-10-CM | POA: Diagnosis not present

## 2015-10-24 DIAGNOSIS — R0789 Other chest pain: Secondary | ICD-10-CM | POA: Diagnosis not present

## 2015-10-24 LAB — NM MYOCAR MULTI W/SPECT W/WALL MOTION / EF
Estimated workload: 1 METS
Exercise duration (min): 0 min
Exercise duration (sec): 0 s
LV dias vol: 120 mL (ref 46–106)
LV sys vol: 53 mL
MPHR: 159 {beats}/min
Peak HR: 75 {beats}/min
Percent HR: 47 %
Rest HR: 61 {beats}/min
SDS: 3
SRS: 1
SSS: 1
TID: 1.15

## 2015-10-24 LAB — BASIC METABOLIC PANEL
Anion gap: 5 (ref 5–15)
Anion gap: 7 (ref 5–15)
BUN: 32 mg/dL — ABNORMAL HIGH (ref 6–20)
BUN: 33 mg/dL — ABNORMAL HIGH (ref 6–20)
CO2: 23 mmol/L (ref 22–32)
CO2: 20 mmol/L — ABNORMAL LOW (ref 22–32)
Calcium: 7.9 mg/dL — ABNORMAL LOW (ref 8.9–10.3)
Calcium: 7.9 mg/dL — ABNORMAL LOW (ref 8.9–10.3)
Chloride: 109 mmol/L (ref 101–111)
Chloride: 106 mmol/L (ref 101–111)
Creatinine, Ser: 3.89 mg/dL — ABNORMAL HIGH (ref 0.44–1.00)
Creatinine, Ser: 4.13 mg/dL — ABNORMAL HIGH (ref 0.44–1.00)
GFR calc Af Amer: 13 mL/min — ABNORMAL LOW (ref >60)
GFR calc Af Amer: 12 mL/min — ABNORMAL LOW (ref >60)
GFR calc non Af Amer: 12 mL/min — ABNORMAL LOW (ref >60)
GFR calc non Af Amer: 11 mL/min — ABNORMAL LOW (ref >60)
Glucose, Bld: 142 mg/dL — ABNORMAL HIGH (ref 65–99)
Glucose, Bld: 150 mg/dL — ABNORMAL HIGH (ref 65–99)
Potassium: 3.8 mmol/L (ref 3.5–5.1)
Potassium: 3.6 mmol/L (ref 3.5–5.1)
Sodium: 137 mmol/L (ref 135–145)
Sodium: 133 mmol/L — ABNORMAL LOW (ref 135–145)

## 2015-10-24 LAB — LIPID PANEL
Cholesterol: 187 mg/dL (ref 0–200)
HDL: 48 mg/dL (ref >40)
LDL Cholesterol: 119 mg/dL — ABNORMAL HIGH (ref 0–99)
Total CHOL/HDL Ratio: 3.9 RATIO
Triglycerides: 102 mg/dL (ref <150)
VLDL: 20 mg/dL (ref 0–40)

## 2015-10-24 LAB — CBC
HCT: 20.6 % — ABNORMAL LOW (ref 35.0–47.0)
Hemoglobin: 7.1 g/dL — ABNORMAL LOW (ref 12.0–16.0)
MCH: 30.3 pg (ref 26.0–34.0)
MCHC: 34.4 g/dL (ref 32.0–36.0)
MCV: 88 fL (ref 80.0–100.0)
Platelets: 173 10*3/uL (ref 150–440)
RBC: 2.34 MIL/uL — ABNORMAL LOW (ref 3.80–5.20)
RDW: 16.1 % — ABNORMAL HIGH (ref 11.5–14.5)
WBC: 7.6 10*3/uL (ref 3.6–11.0)

## 2015-10-24 LAB — GLUCOSE, CAPILLARY
Glucose-Capillary: 135 mg/dL — ABNORMAL HIGH (ref 65–99)
Glucose-Capillary: 131 mg/dL — ABNORMAL HIGH (ref 65–99)
Glucose-Capillary: 134 mg/dL — ABNORMAL HIGH (ref 65–99)
Glucose-Capillary: 149 mg/dL — ABNORMAL HIGH (ref 65–99)

## 2015-10-24 LAB — HEMOGLOBIN AND HEMATOCRIT, BLOOD
HCT: 25.3 % — ABNORMAL LOW (ref 35.0–47.0)
Hemoglobin: 8.6 g/dL — ABNORMAL LOW (ref 12.0–16.0)

## 2015-10-24 LAB — PREPARE RBC (CROSSMATCH)

## 2015-10-24 MED ORDER — SENNOSIDES-DOCUSATE SODIUM 8.6-50 MG PO TABS
2.0000 | ORAL_TABLET | Freq: Two times a day (BID) | ORAL | Status: DC
  Administered 2015-10-24 – 2015-10-26 (×5): 2 via ORAL
  Filled 2015-10-24 (×5): qty 2

## 2015-10-24 MED ORDER — FUROSEMIDE 10 MG/ML IJ SOLN: 40.0000 mg | Freq: Once | INTRAMUSCULAR | Status: DC

## 2015-10-24 MED ORDER — DIPHENHYDRAMINE HCL 25 MG PO CAPS
25.0000 mg | ORAL_CAPSULE | Freq: Once | ORAL | Status: AC
  Administered 2015-10-25: 25 mg via ORAL
  Filled 2015-10-24: qty 1

## 2015-10-24 MED ORDER — ISOSORBIDE MONONITRATE ER 30 MG PO TB24
30.0000 mg | ORAL_TABLET | Freq: Every day | ORAL | Status: DC
  Administered 2015-10-24 – 2015-10-26 (×3): 30 mg via ORAL
  Filled 2015-10-24 (×3): qty 1

## 2015-10-24 MED ORDER — ACETAMINOPHEN 325 MG PO TABS: 650.0000 mg | ORAL_TABLET | Freq: Once | ORAL | Status: DC

## 2015-10-24 MED ORDER — TECHNETIUM TC 99M TETROFOSMIN IV KIT
30.0000 | PACK | Freq: Once | INTRAVENOUS | Status: AC | PRN
  Administered 2015-10-24: 30.184 via INTRAVENOUS

## 2015-10-24 MED ORDER — BISACODYL 5 MG PO TBEC
10.0000 mg | DELAYED_RELEASE_TABLET | Freq: Once | ORAL | Status: AC
  Administered 2015-10-24: 10 mg via ORAL
  Filled 2015-10-24: qty 2

## 2015-10-24 MED ORDER — TECHNETIUM TC 99M TETROFOSMIN IV KIT
13.4600 | PACK | Freq: Once | INTRAVENOUS | Status: AC | PRN
  Administered 2015-10-24: 13.46 via INTRAVENOUS

## 2015-10-24 MED ORDER — GABAPENTIN 100 MG PO CAPS
100.0000 mg | ORAL_CAPSULE | Freq: Three times a day (TID) | ORAL | Status: DC
  Administered 2015-10-24 – 2015-10-26 (×6): 100 mg via ORAL
  Filled 2015-10-24 (×6): qty 1

## 2015-10-24 MED ORDER — SODIUM CHLORIDE 0.9 % IV SOLN: Freq: Once | INTRAVENOUS | Status: DC

## 2015-10-24 NOTE — Progress Notes (Signed)
[facility] at [place] NAME: Jamie Arias    MR#:  [id number]  DATE OF BIRTH:  04/28/1955  SUBJECTIVE:  CHIEF COMPLAINT:   Chief Complaint  Patient presents with  . Chest Pain  . Nausea   Chest pain and vomiting resolved. Feels much better. Fatigue.  REVIEW OF SYSTEMS:    Review of Systems  Constitutional: Positive for malaise/fatigue. Negative for chills and fever.  HENT: Negative for sore throat.   Eyes: Negative for blurred vision, double vision and pain.  Respiratory: Negative for cough, hemoptysis, shortness of breath and wheezing.   Cardiovascular: Negative for chest pain, palpitations, orthopnea and leg swelling.  Gastrointestinal: Negative for abdominal pain, constipation, diarrhea and vomiting.  Genitourinary: Negative for dysuria and hematuria.  Musculoskeletal: Negative for back pain and joint pain.  Skin: Negative for rash.  Neurological: Positive for weakness. Negative for sensory change, speech change, focal weakness and headaches.  Endo/Heme/Allergies: Does not bruise/bleed easily.  Psychiatric/Behavioral: Negative for depression. The patient is not nervous/anxious.      DRUG ALLERGIES:  No Known Allergies  VITALS:  Blood pressure (!) 157/60, pulse 67, temperature 98.3 F (36.8 C), temperature source Oral, resp. rate 18, height 5\' 4"  (1.626 m), weight 90.7 kg (200 lb), SpO2 100 %.  PHYSICAL EXAMINATION:   Physical Exam  GENERAL:  60 y.o.-year-old patient lying in the bed with Severe distress due to pain and vomiting EYES: Pupils equal, round, reactive to light and accommodation. No scleral icterus. Extraocular muscles intact.  HEENT: Head atraumatic, normocephalic. Oropharynx and nasopharynx clear.  NECK:  Supple, no jugular venous distention. No thyroid enlargement, no tenderness.  LUNGS: Normal breath sounds bilaterally, no wheezing, rales, rhonchi. No use of accessory muscles of respiration.   CARDIOVASCULAR: S1, S2 normal. No murmurs, rubs, or gallops.  ABDOMEN: Soft, nontender, nondistended. Bowel sounds present. No organomegaly or mass.  EXTREMITIES: No cyanosis, clubbing or edema b/l.    NEUROLOGIC: Cranial nerves II through XII are intact. No focal Motor or sensory deficits b/l.   PSYCHIATRIC: The patient is alert and oriented x 3.  SKIN: No obvious rash, lesion, or ulcer.   LABORATORY PANEL:   CBC  Recent Labs Lab 10/24/15 0415  WBC 7.6  HGB 7.1*  HCT 20.6*  PLT 173   ------------------------------------------------------------------------------------------------------------------ Chemistries   Recent Labs Lab 10/23/15 0312  10/24/15 0415  NA 135  --  137  K 3.9  --  3.8  CL 105  --  109  CO2 20*  --  23  GLUCOSE 227*  --  142*  BUN 28*  --  32*  CREATININE 3.36*  < > 3.89*  CALCIUM 8.7*  --  7.9*  AST 23  --   --   ALT 11*  --   --   ALKPHOS 66  --   --   BILITOT 0.6  --   --   < > = values in this interval not displayed. ------------------------------------------------------------------------------------------------------------------  Cardiac Enzymes  Recent Labs Lab 10/23/15 2030  TROPONINI 0.06*   ------------------------------------------------------------------------------------------------------------------  RADIOLOGY:  Ct Abdomen Pelvis Wo Contrast  Result Date: 10/23/2015 CLINICAL DATA:  Nausea and vomiting for 2 days. Substernal chest pain. EXAM: CT CHEST, ABDOMEN AND PELVIS WITHOUT CONTRAST TECHNIQUE: Multidetector CT imaging of the chest, abdomen and pelvis was performed following the standard protocol without IV contrast. COMPARISON:  Abdominal CT 03/08/2015.  Chest CT 07/19/2013 FINDINGS: CT CHEST FINDINGS Cardiovascular: Chronic cardiomegaly. No pericardial effusion. Status post  CABG. No evidence of acute vascular disease. Dilated main pulmonary artery at 39 mm compatible with pulmonary hypertension. Mediastinum:  Negative for  adenopathy. Lungs/Pleura: Mild septal thickening greatest at the bases. Mild atelectasis. Small pleural effusions with fissural component and pleural thickening on the left. Right pleural effusion is small and layering. Pleural fluid is stable from prior. There is a multi segment left lower lobe opacity with distorted interstitium that has a curvilinear appearance on coronal reformats. Findings are stable from prior and consistent with round atelectasis. Musculoskeletal: No acute or aggressive osseous finding.  Anasarca CT ABDOMEN PELVIS FINDINGS Lower chest and abdominal wall:  Anasarca Hepatobiliary: No focal liver abnormality.Cholecystectomy with normal common bile duct diameter. Pancreas: Unremarkable. Spleen: Unremarkable. Adrenals/Urinary Tract: Negative adrenals. No hydronephrosis or stone. Unremarkable bladder. Stomach/Bowel: No obstruction. No convincing bowel wall thickening when accounting for under distended segments of colon. No appendicitis. Reproductive:No pathologic findings. Vascular/Lymphatic: No acute vascular abnormality. No mass or adenopathy. Other: No ascites or pneumoperitoneum. Musculoskeletal: No acute abnormalities. IMPRESSION: 1. Mild pulmonary edema.  Anasarca. 2. Chronic small pleural effusions with greater complexity and lower lobe round atelectasis on the left. 3. No bowel obstruction to explain nausea and vomiting. 4.  Aortic Atherosclerosis (ICD10-170.0) 5. Cardiomegaly and possible pulmonary hypertension. Electronically Signed   By: Monte Fantasia M.D.   On: 10/23/2015 05:04   Dg Chest 2 View  Result Date: 10/23/2015 CLINICAL DATA:  Nausea and vomiting. Chest pain. Shortness of breath. EXAM: CHEST  2 VIEW COMPARISON:  Chest radiograph 07/22/2015 FINDINGS: Unchanged CABG markers and median sternotomy wires. Persistent cardiomegaly. Small left pleural effusion. Left basilar opacities. No other area of consolidation. No pulmonary edema. No pneumothorax. IMPRESSION: 1. Unchanged  cardiomegaly with small left pleural effusion. 2. Left basilar opacities, likely atelectasis. Electronically Signed   By: Ulyses Jarred M.D.   On: 10/23/2015 03:54   Ct Chest Wo Contrast  Result Date: 10/23/2015 CLINICAL DATA:  Nausea and vomiting for 2 days. Substernal chest pain. EXAM: CT CHEST, ABDOMEN AND PELVIS WITHOUT CONTRAST TECHNIQUE: Multidetector CT imaging of the chest, abdomen and pelvis was performed following the standard protocol without IV contrast. COMPARISON:  Abdominal CT 03/08/2015.  Chest CT 07/19/2013 FINDINGS: CT CHEST FINDINGS Cardiovascular: Chronic cardiomegaly. No pericardial effusion. Status post CABG. No evidence of acute vascular disease. Dilated main pulmonary artery at 39 mm compatible with pulmonary hypertension. Mediastinum:  Negative for adenopathy. Lungs/Pleura: Mild septal thickening greatest at the bases. Mild atelectasis. Small pleural effusions with fissural component and pleural thickening on the left. Right pleural effusion is small and layering. Pleural fluid is stable from prior. There is a multi segment left lower lobe opacity with distorted interstitium that has a curvilinear appearance on coronal reformats. Findings are stable from prior and consistent with round atelectasis. Musculoskeletal: No acute or aggressive osseous finding.  Anasarca CT ABDOMEN PELVIS FINDINGS Lower chest and abdominal wall:  Anasarca Hepatobiliary: No focal liver abnormality.Cholecystectomy with normal common bile duct diameter. Pancreas: Unremarkable. Spleen: Unremarkable. Adrenals/Urinary Tract: Negative adrenals. No hydronephrosis or stone. Unremarkable bladder. Stomach/Bowel: No obstruction. No convincing bowel wall thickening when accounting for under distended segments of colon. No appendicitis. Reproductive:No pathologic findings. Vascular/Lymphatic: No acute vascular abnormality. No mass or adenopathy. Other: No ascites or pneumoperitoneum. Musculoskeletal: No acute abnormalities.  IMPRESSION: 1. Mild pulmonary edema.  Anasarca. 2. Chronic small pleural effusions with greater complexity and lower lobe round atelectasis on the left. 3. No bowel obstruction to explain nausea and vomiting. 4.  Aortic Atherosclerosis (ICD10-170.0) 5. Cardiomegaly  and possible pulmonary hypertension. Electronically Signed   By: Monte Fantasia M.D.   On: 10/23/2015 05:04     ASSESSMENT AND PLAN:   * Acute anemia over chronic anemia Etiology unclear. Stool for Hemoccult was indeterminate as patient did not have any stool in the rectal vault on rectal exam. We'll check Hemoccult stool and patient has a bowel movement. Transfuse 1 unit packed RBC. Hold heparin and aspirin. PPI. Patient had an EGD done recently and had H pylori. No ulcers.  * Accelerated hypertension Resume Coreg and hydralazine Improved.  * Vomiting likely secondary to gastroparesis Lipase normal. CT scan of the abdomen pelvis showed nothing acute. Resolved with PPI and scheduled reglan.  * Chest pain likely due to GERD and gastroparesis Cardiology consulted. Troponin 2 normal. EKG shows nothing acute. Stress test done and pending.  * Acute kidney injury over Chronic kidney disease stage III Could be due to accelerated hypertension. Progressively worsening CKD. Monitor input and output. Repeat labs in the morning. Nephrology following  *  chronic Systolic heart failure No signs of fluid overload.  * Coronary artery disease  * Hyperlipidemia  All the records are reviewed and case discussed with Care Management/Social Workerr. Management plans discussed with the patient, family and they are in agreement.  CODE STATUS: FULL CODE  DVT Prophylaxis: SCDs  TOTAL CC TIME TAKING CARE OF THIS PATIENT: 35 minutes.   POSSIBLE D/C IN 40 DAYS, DEPENDING ON CLINICAL CONDITION.  Hillary Bow R M.D on 10/24/2015 at 1:38 PM  Between 7am to 6pm - Pager - [phone]  After 6pm go to [email or web address] - password [email or web address]  [facility]  Office  [phone]  CC: Primary care physician; [name], MD  Note: This dictation was prepared with Dragon dictation along with smaller phrase technology. Any transcriptional errors that result from this process are unintentional.

## 2015-10-24 NOTE — Progress Notes (Signed)
Drop in Hgb this morning. Paged Dr. Darvin Neighbours.

## 2015-10-24 NOTE — Progress Notes (Signed)
Per Dr. Juleen China, give patient 40mg  IV lasix prior to blood transfusion.

## 2015-10-24 NOTE — Progress Notes (Signed)
[facility]  ROUNDING NOTE   Subjective:   Husband at bedside.  Stress test this morning.  Continues to have nausea and chest pain.   Blood pressure better controlled.    Objective:  Vital signs in last 24 hours:  Temp:  [98.3 F (36.8 C)-98.8 F (37.1 C)] 98.3 F (36.8 C) (08/21 0432) Pulse Rate:  [61-72] 67 (08/21 1222) Resp:  [18] 18 (08/21 1222) BP: (118-172)/(57-74) 157/60 (08/21 1222) SpO2:  [95 %-100 %] 100 % (08/21 1222)  Weight change:  Filed Weights   10/23/15 0256  Weight: 90.7 kg (200 lb)    Intake/Output: I/O last 3 completed shifts: In: 483 [P.O.:480; I.V.:3] Out: 675 [Urine:675]   Intake/Output this shift:  No intake/output data recorded.  Physical Exam: General: Laying in bed  Head: Normocephalic, atraumatic. Moist oral mucosal membranes  Eyes: Anicteric  Neck: Supple, trachea midline  Lungs:  Basilar rales, normal effort  Heart: S1S2 no rubs  Abdomen:  Soft, nontender, BS present   Extremities: trace peripheral edema.  Neurologic: Nonfocal, moving all four extremities  Skin: No lesions  Access: none    Basic Metabolic Panel:  Recent Labs Lab 10/23/15 0312 10/23/15 0834 10/24/15 0415  NA 135  --  137  K 3.9  --  3.8  CL 105  --  109  CO2 20*  --  23  GLUCOSE 227*  --  142*  BUN 28*  --  32*  CREATININE 3.36* 3.40* 3.89*  CALCIUM 8.7*  --  7.9*    Liver Function Tests:  Recent Labs Lab 10/23/15 0312  AST 23  ALT 11*  ALKPHOS 66  BILITOT 0.6  PROT 6.3*  ALBUMIN 2.8*    Recent Labs Lab 10/23/15 0312  LIPASE 12   No results for input(s): AMMONIA in the last 168 hours.  CBC:  Recent Labs Lab 10/23/15 0312 10/24/15 0415  WBC 8.5 7.6  HGB 10.0* 7.1*  HCT 29.1* 20.6*  MCV 87.2 88.0  PLT 232 173    Cardiac Enzymes:  Recent Labs Lab 10/23/15 0312 10/23/15 0834 10/23/15 1419 10/23/15 2030  TROPONINI <0.03 <0.03 0.04* 0.06*    BNP: Invalid input(s): POCBNP  CBG:  Recent Labs Lab  10/23/15 1205 10/23/15 1644 10/23/15 2100 10/24/15 0759 10/24/15 1206  GLUCAP 234* 125* 192* 135* 131*    Microbiology: Results for orders placed or performed during the hospital encounter of 06/29/13  MRSA PCR Screening     Status: None   Collection Time: 06/29/13  8:45 PM  Result Value Ref Range Status   MRSA by PCR NEGATIVE NEGATIVE Final    Comment:        The GeneXpert MRSA Assay (FDA approved for NASAL specimens only), is one component of a comprehensive MRSA colonization surveillance program. It is not intended to diagnose MRSA infection nor to guide or monitor treatment for MRSA infections.  Surgical pcr screen     Status: None   Collection Time: 06/30/13  8:50 AM  Result Value Ref Range Status   MRSA, PCR NEGATIVE NEGATIVE Final   Staphylococcus aureus NEGATIVE NEGATIVE Final    Comment:        The Xpert SA Assay (FDA approved for NASAL specimens in patients over 39 years of age), is one component of a comprehensive surveillance program.  Test performance has been validated by [employer] for patients greater than or equal to 46 year old. It is not intended to diagnose infection nor to guide or monitor treatment.  Coagulation Studies: No results for input(s): LABPROT, INR in the last 72 hours.  Urinalysis:  Recent Labs  10/23/15 0503  COLORURINE YELLOW*  LABSPEC 1.013  PHURINE 7.0  GLUCOSEU >500*  HGBUR 1+*  BILIRUBINUR NEGATIVE  KETONESUR TRACE*  PROTEINUR >500*  NITRITE NEGATIVE  LEUKOCYTESUR NEGATIVE      Imaging: Ct Abdomen Pelvis Wo Contrast  Result Date: 10/23/2015 CLINICAL DATA:  Nausea and vomiting for 2 days. Substernal chest pain. EXAM: CT CHEST, ABDOMEN AND PELVIS WITHOUT CONTRAST TECHNIQUE: Multidetector CT imaging of the chest, abdomen and pelvis was performed following the standard protocol without IV contrast. COMPARISON:  Abdominal CT 03/08/2015.  Chest CT 07/19/2013 FINDINGS: CT CHEST FINDINGS Cardiovascular: Chronic  cardiomegaly. No pericardial effusion. Status post CABG. No evidence of acute vascular disease. Dilated main pulmonary artery at 39 mm compatible with pulmonary hypertension. Mediastinum:  Negative for adenopathy. Lungs/Pleura: Mild septal thickening greatest at the bases. Mild atelectasis. Small pleural effusions with fissural component and pleural thickening on the left. Right pleural effusion is small and layering. Pleural fluid is stable from prior. There is a multi segment left lower lobe opacity with distorted interstitium that has a curvilinear appearance on coronal reformats. Findings are stable from prior and consistent with round atelectasis. Musculoskeletal: No acute or aggressive osseous finding.  Anasarca CT ABDOMEN PELVIS FINDINGS Lower chest and abdominal wall:  Anasarca Hepatobiliary: No focal liver abnormality.Cholecystectomy with normal common bile duct diameter. Pancreas: Unremarkable. Spleen: Unremarkable. Adrenals/Urinary Tract: Negative adrenals. No hydronephrosis or stone. Unremarkable bladder. Stomach/Bowel: No obstruction. No convincing bowel wall thickening when accounting for under distended segments of colon. No appendicitis. Reproductive:No pathologic findings. Vascular/Lymphatic: No acute vascular abnormality. No mass or adenopathy. Other: No ascites or pneumoperitoneum. Musculoskeletal: No acute abnormalities. IMPRESSION: 1. Mild pulmonary edema.  Anasarca. 2. Chronic small pleural effusions with greater complexity and lower lobe round atelectasis on the left. 3. No bowel obstruction to explain nausea and vomiting. 4.  Aortic Atherosclerosis (ICD10-170.0) 5. Cardiomegaly and possible pulmonary hypertension. Electronically Signed   By: Marnee Spring M.D.   On: 10/23/2015 05:04   Dg Chest 2 View  Result Date: 10/23/2015 CLINICAL DATA:  Nausea and vomiting. Chest pain. Shortness of breath. EXAM: CHEST  2 VIEW COMPARISON:  Chest radiograph 07/22/2015 FINDINGS: Unchanged CABG markers  and median sternotomy wires. Persistent cardiomegaly. Small left pleural effusion. Left basilar opacities. No other area of consolidation. No pulmonary edema. No pneumothorax. IMPRESSION: 1. Unchanged cardiomegaly with small left pleural effusion. 2. Left basilar opacities, likely atelectasis. Electronically Signed   By: Deatra Robinson M.D.   On: 10/23/2015 03:54   Ct Chest Wo Contrast  Result Date: 10/23/2015 CLINICAL DATA:  Nausea and vomiting for 2 days. Substernal chest pain. EXAM: CT CHEST, ABDOMEN AND PELVIS WITHOUT CONTRAST TECHNIQUE: Multidetector CT imaging of the chest, abdomen and pelvis was performed following the standard protocol without IV contrast. COMPARISON:  Abdominal CT 03/08/2015.  Chest CT 07/19/2013 FINDINGS: CT CHEST FINDINGS Cardiovascular: Chronic cardiomegaly. No pericardial effusion. Status post CABG. No evidence of acute vascular disease. Dilated main pulmonary artery at 39 mm compatible with pulmonary hypertension. Mediastinum:  Negative for adenopathy. Lungs/Pleura: Mild septal thickening greatest at the bases. Mild atelectasis. Small pleural effusions with fissural component and pleural thickening on the left. Right pleural effusion is small and layering. Pleural fluid is stable from prior. There is a multi segment left lower lobe opacity with distorted interstitium that has a curvilinear appearance on coronal reformats. Findings are stable from prior and consistent with  round atelectasis. Musculoskeletal: No acute or aggressive osseous finding.  Anasarca CT ABDOMEN PELVIS FINDINGS Lower chest and abdominal wall:  Anasarca Hepatobiliary: No focal liver abnormality.Cholecystectomy with normal common bile duct diameter. Pancreas: Unremarkable. Spleen: Unremarkable. Adrenals/Urinary Tract: Negative adrenals. No hydronephrosis or stone. Unremarkable bladder. Stomach/Bowel: No obstruction. No convincing bowel wall thickening when accounting for under distended segments of colon. No  appendicitis. Reproductive:No pathologic findings. Vascular/Lymphatic: No acute vascular abnormality. No mass or adenopathy. Other: No ascites or pneumoperitoneum. Musculoskeletal: No acute abnormalities. IMPRESSION: 1. Mild pulmonary edema.  Anasarca. 2. Chronic small pleural effusions with greater complexity and lower lobe round atelectasis on the left. 3. No bowel obstruction to explain nausea and vomiting. 4.  Aortic Atherosclerosis (ICD10-170.0) 5. Cardiomegaly and possible pulmonary hypertension. Electronically Signed   By: Monte Fantasia M.D.   On: 10/23/2015 05:04     Medications:     . sodium chloride   Intravenous Once  . acetaminophen  650 mg Oral Once  . amLODipine  10 mg Oral Daily  . atorvastatin  80 mg Oral Daily  . metroNIDAZOLE  375 mg Oral TID AC & HS   And  . tetracycline  500 mg Oral TID AC & HS   And  . bismuth subsalicylate  24 mL Oral TID AC & HS  . carvedilol  12.5 mg Oral BID  . diphenhydrAMINE  25 mg Oral Once  . gabapentin  300 mg Oral TID  . hydrALAZINE  25 mg Oral TID  . insulin aspart  0-15 Units Subcutaneous TID WC  . insulin aspart  0-5 Units Subcutaneous QHS  . isosorbide mononitrate  30 mg Oral Daily  . levothyroxine  75 mcg Oral QAC breakfast  . metoCLOPramide (REGLAN) injection  10 mg Intravenous Q6H  . pantoprazole  40 mg Oral BID AC  . regadenoson  0.4 mg Intravenous Once  . senna-docusate  2 tablet Oral BID  . sodium chloride flush  3 mL Intravenous Q12H  . traZODone  50 mg Oral QHS   acetaminophen, hydrALAZINE, morphine injection, nitroGLYCERIN, ondansetron (ZOFRAN) IV, sodium chloride flush  Assessment/ Plan:  60 y.o.white female with hypertension, diabetes mellitus type II insulin-dependent, hyperlipidemia, coronary artery disease status post CABG, anemia, congestive heart failure, pulmonary hypertension   1. Acute renal failure on chronic kidney disease stage IV: concern for progression of disease baseline creatinine 3.44 EGFR 14 on  8/14.  Chronic kidney disease secondary to diabetic nephropathy and hypertension.  No acute indication for dialysis. Will need dialysis in the future.  2. Hypertension: difficult to control. Now better control.  - amlodipine, carvedilol, hydralazine. Holding outpatient lisinopril and furosemide.   3. Anemia of chronic kidney disease. Hemoglobin dropped to 7.1 - PRBC transfusion ordered.  - GI to evaluate.   LOS: 0 Chyane Greer 8/21/201712:54 PM

## 2015-10-24 NOTE — Progress Notes (Signed)
SUBJECTIVE:  No recurrent chest pain. Blood pressure is controlled. Her weight is close to baseline. Anemia has worsened and creatinine is slightly worse.   Vitals:   10/23/15 1210 10/23/15 1655 10/23/15 1932 10/24/15 0432  BP: (!) 159/69 (!) 172/74 (!) 144/64 (!) 118/57  Pulse: 89  72 61  Resp: 20  18 18   Temp: 97.8 F (36.6 C)  98.8 F (37.1 C) 98.3 F (36.8 C)  TempSrc: Oral  Oral Oral  SpO2: 94%  97% 95%  Weight:      Height:        Intake/Output Summary (Last 24 hours) at 10/24/15 0807 Last data filed at 10/24/15 [id number]  Gross per 24 hour  Intake              483 ml  Output              675 ml  Net             -192 ml    LABS: Basic Metabolic Panel:  Recent Labs  10/23/15 0312 10/23/15 0834 10/24/15 0415  NA 135  --  137  K 3.9  --  3.8  CL 105  --  109  CO2 20*  --  23  GLUCOSE 227*  --  142*  BUN 28*  --  32*  CREATININE 3.36* 3.40* 3.89*  CALCIUM 8.7*  --  7.9*   Liver Function Tests:  Recent Labs  10/23/15 0312  AST 23  ALT 11*  ALKPHOS 66  BILITOT 0.6  PROT 6.3*  ALBUMIN 2.8*    Recent Labs  10/23/15 0312  LIPASE 12   CBC:  Recent Labs  10/23/15 0312 10/24/15 0415  WBC 8.5 7.6  HGB 10.0* 7.1*  HCT 29.1* 20.6*  MCV 87.2 88.0  PLT 232 173   Cardiac Enzymes:  Recent Labs  10/23/15 0834 10/23/15 1419 10/23/15 2030  TROPONINI <0.03 0.04* 0.06*   BNP: Invalid input(s): POCBNP D-Dimer: No results for input(s): DDIMER in the last 72 hours. Hemoglobin A1C: No results for input(s): HGBA1C in the last 72 hours. Fasting Lipid Panel:  Recent Labs  10/24/15 0415  CHOL 187  HDL 48  LDLCALC 119*  TRIG 102  CHOLHDL 3.9   Thyroid Function Tests: No results for input(s): TSH, T4TOTAL, T3FREE, THYROIDAB in the last 72 hours.  Invalid input(s): FREET3 Anemia Panel: No results for input(s): VITAMINB12, FOLATE, FERRITIN, TIBC, IRON, RETICCTPCT in the last 72 hours.   PHYSICAL EXAM General: Well developed, well nourished,  in no acute distress HEENT:  Normocephalic and atramatic Neck:  No JVD.  Lungs: Clear bilaterally to auscultation and percussion. Heart: HRRR . Normal S1 and S2 without gallops . 2/6 systolic ejection murmur in the aortic area Abdomen: Bowel sounds are positive, abdomen soft and non-tender  Msk:  Back normal, normal gait. Normal strength and tone for age. Extremities: No clubbing, cyanosis or edema.   Neuro: Alert and oriented X 3. Psych:  Good affect, responds appropriately  TELEMETRY: Reviewed telemetry pt in normal sinus rhythm:  ASSESSMENT AND PLAN:  1. Possible unstable angina: The patient has known history of coronary artery disease status post CABG. EKG showed no acute changes. Troponin was only borderline elevated but blood pressure was elevated on presentation. She also has underlying anemia which might be causing some supply demand ischemia. She had some associated GI symptoms and that has been an issue recently especially with her worsening renal function. Echocardiogram showed normal LV systolic function  and wall motion. I agree with a pharmacologic nuclear stress test today to look for high-risk ischemia. I doubt the need for cardiac catheterization. I switched Isordil to Imdur 30 mg once daily.  2. Chronic diastolic heart failure: Most recent ejection fraction was normal. She appears to be euvolemic and currently at her dry weight.  3. Essential hypertension: Blood pressure is controlled on current medications.  4. Anemia of chronic disease: Her hemoglobin dropped to 7. It was 10 on presentation. Consider repeat to confirm. Given her underlying coronary artery disease and recent chest pain, recommend keeping hemoglobin of 8 if possible.  Kathlyn Sacramento, MD, [facility] 10/24/2015 8:07 AM

## 2015-10-24 NOTE — Progress Notes (Signed)
Hgb came back at 8.6. Order to not give blood per Dr. Darvin Neighbours

## 2015-10-24 NOTE — Progress Notes (Signed)
CBG 131 per [place] NT. Glucometer not synching.

## 2015-10-24 NOTE — Progress Notes (Signed)
  Repeat hemoglobin shows 8.6. We will hold blood transfusion. Repeat labs in the morning. Check stool for Hemoccult.

## 2015-10-24 NOTE — Progress Notes (Signed)
Inpatient Diabetes Program Recommendations  AACE/ADA: New Consensus Statement on Inpatient Glycemic Control (2015)  Target Ranges:  Prepandial:   less than 140 mg/dL      Peak postprandial:   less than 180 mg/dL (1-2 hours)      Critically ill patients:  140 - 180 mg/dL  Results for Jamie Arias, Jamie Arias (MRN [id number]) as of 10/24/2015 11:50  Ref. Range 10/23/2015 08:34 10/23/2015 10:22 10/23/2015 12:05 10/23/2015 16:44 10/23/2015 21:00 10/24/2015 07:59  Glucose-Capillary Latest Ref Range: 65 - 99 mg/dL 275 (H) 262 (H) 234 (H) 125 (H) 192 (H) 135 (H)    Review of Glycemic Control  Diabetes history: DM2 Outpatient Diabetes medications: 70/30 8 units BID Current orders for Inpatient glycemic control: Novolog 0-15 units TID with meals, Novolog 0-5 units QHS  Inpatient Diabetes Program Recommendations: Insulin - Meal Coverage: Post prandial glucose is consistently elevated. Please consider ordering Novolog 3 units TID with meals for meal coverage if patient eats at least 50% of meals.  Thanks, Barnie Alderman, RN, MSN, CDE Diabetes Coordinator Inpatient Diabetes Program [phone] (Team Pager from [facility] [place]) [phone] ([facility] office) [phone] [facility] office) [phone] [facility] office)

## 2015-10-25 DIAGNOSIS — K922 Gastrointestinal hemorrhage, unspecified: Secondary | ICD-10-CM | POA: Diagnosis present

## 2015-10-25 LAB — BASIC METABOLIC PANEL
Anion gap: 4 — ABNORMAL LOW (ref 5–15)
BUN: 38 mg/dL — ABNORMAL HIGH (ref 6–20)
CO2: 21 mmol/L — ABNORMAL LOW (ref 22–32)
Calcium: 7.6 mg/dL — ABNORMAL LOW (ref 8.9–10.3)
Chloride: 107 mmol/L (ref 101–111)
Creatinine, Ser: 5.13 mg/dL — ABNORMAL HIGH (ref 0.44–1.00)
GFR calc Af Amer: 10 mL/min — ABNORMAL LOW (ref >60)
GFR calc non Af Amer: 8 mL/min — ABNORMAL LOW (ref >60)
Glucose, Bld: 132 mg/dL — ABNORMAL HIGH (ref 65–99)
Potassium: 4 mmol/L (ref 3.5–5.1)
Sodium: 132 mmol/L — ABNORMAL LOW (ref 135–145)

## 2015-10-25 LAB — GLUCOSE, CAPILLARY
Glucose-Capillary: 206 mg/dL — ABNORMAL HIGH (ref 65–99)
Glucose-Capillary: 155 mg/dL — ABNORMAL HIGH (ref 65–99)
Glucose-Capillary: 112 mg/dL — ABNORMAL HIGH (ref 65–99)
Glucose-Capillary: 149 mg/dL — ABNORMAL HIGH (ref 65–99)

## 2015-10-25 LAB — ABO/RH: ABO/RH(D): A POS

## 2015-10-25 LAB — PREPARE RBC (CROSSMATCH)

## 2015-10-25 LAB — HEMOGLOBIN: Hemoglobin: 7.4 g/dL — ABNORMAL LOW (ref 12.0–16.0)

## 2015-10-25 LAB — HEMOGLOBIN AND HEMATOCRIT, BLOOD
HCT: 24.7 % — ABNORMAL LOW (ref 35.0–47.0)
Hemoglobin: 8.5 g/dL — ABNORMAL LOW (ref 12.0–16.0)

## 2015-10-25 MED ORDER — SODIUM BICARBONATE 650 MG PO TABS
650.0000 mg | ORAL_TABLET | Freq: Two times a day (BID) | ORAL | Status: DC
  Administered 2015-10-25 – 2015-10-26 (×3): 650 mg via ORAL
  Filled 2015-10-25 (×3): qty 1

## 2015-10-25 MED ORDER — METOCLOPRAMIDE HCL 10 MG PO TABS
5.0000 mg | ORAL_TABLET | Freq: Three times a day (TID) | ORAL | Status: DC
  Administered 2015-10-25 – 2015-10-26 (×2): 5 mg via ORAL
  Filled 2015-10-25 (×2): qty 1

## 2015-10-25 MED ORDER — SODIUM CHLORIDE 0.9 % IV SOLN
Freq: Once | INTRAVENOUS | Status: AC
  Administered 2015-10-25: 12:00:00 via INTRAVENOUS

## 2015-10-25 NOTE — Progress Notes (Signed)
[facility]  ROUNDING NOTE   Subjective:   Husband at bedside. Reports no more chest pain.  Did not get transfusion.  IV furosemide x 1   Objective:  Vital signs in last 24 hours:  Temp:  [97.9 F (36.6 C)-98.7 F (37.1 C)] 98.7 F (37.1 C) (08/22 0810) Pulse Rate:  [59-73] 73 (08/22 0810) Resp:  [15-18] 15 (08/22 0810) BP: (101-166)/(46-73) 166/73 (08/22 0810) SpO2:  [92 %-100 %] 97 % (08/22 0810)  Weight change:  Filed Weights   10/23/15 0256  Weight: 90.7 kg (200 lb)    Intake/Output: I/O last 3 completed shifts: In: 3 [I.V.:3] Out: 175 [Urine:175]   Intake/Output this shift:  Total I/O In: 240 [P.O.:240] Out: -   Physical Exam: General: Laying in bed  Head: Normocephalic, atraumatic. Moist oral mucosal membranes  Eyes: Anicteric  Neck: Supple, trachea midline  Lungs:  Basilar rales, normal effort  Heart: S1S2 no rubs  Abdomen:  Soft, nontender, BS present   Extremities: No peripheral edema.  Neurologic: Nonfocal, moving all four extremities  Skin: No lesions  Access: none    Basic Metabolic Panel:  Recent Labs Lab 10/23/15 0312 10/23/15 0834 10/24/15 0415 10/24/15 1458  NA 135  --  137 133*  K 3.9  --  3.8 3.6  CL 105  --  109 106  CO2 20*  --  23 20*  GLUCOSE 227*  --  142* 150*  BUN 28*  --  32* 33*  CREATININE 3.36* 3.40* 3.89* 4.13*  CALCIUM 8.7*  --  7.9* 7.9*    Liver Function Tests:  Recent Labs Lab 10/23/15 0312  AST 23  ALT 11*  ALKPHOS 66  BILITOT 0.6  PROT 6.3*  ALBUMIN 2.8*    Recent Labs Lab 10/23/15 0312  LIPASE 12   No results for input(s): AMMONIA in the last 168 hours.  CBC:  Recent Labs Lab 10/23/15 0312 10/24/15 0415 10/24/15 1314 10/25/15 0353  WBC 8.5 7.6  --   --   HGB 10.0* 7.1* 8.6* 7.4*  HCT 29.1* 20.6* 25.3*  --   MCV 87.2 88.0  --   --   PLT 232 173  --   --     Cardiac Enzymes:  Recent Labs Lab 10/23/15 0312 10/23/15 0834 10/23/15 1419 10/23/15 2030  TROPONINI  <0.03 <0.03 0.04* 0.06*    BNP: Invalid input(s): POCBNP  CBG:  Recent Labs Lab 10/24/15 0759 10/24/15 1206 10/24/15 1609 10/24/15 2144 10/25/15 0730  GLUCAP 135* 131* 134* 149* 18*    Microbiology: Results for orders placed or performed during the hospital encounter of 06/29/13  MRSA PCR Screening     Status: None   Collection Time: 06/29/13  8:45 PM  Result Value Ref Range Status   MRSA by PCR NEGATIVE NEGATIVE Final    Comment:        The GeneXpert MRSA Assay (FDA approved for NASAL specimens only), is one component of a comprehensive MRSA colonization surveillance program. It is not intended to diagnose MRSA infection nor to guide or monitor treatment for MRSA infections.  Surgical pcr screen     Status: None   Collection Time: 06/30/13  8:50 AM  Result Value Ref Range Status   MRSA, PCR NEGATIVE NEGATIVE Final   Staphylococcus aureus NEGATIVE NEGATIVE Final    Comment:        The Xpert SA Assay (FDA approved for NASAL specimens in patients over 59 years of age), is one component of a  comprehensive surveillance program.  Test performance has been validated by [facility] for patients greater than or equal to 62 year old. It is not intended to diagnose infection nor to guide or monitor treatment.    Coagulation Studies: No results for input(s): LABPROT, INR in the last 72 hours.  Urinalysis:  Recent Labs  10/23/15 0503  COLORURINE YELLOW*  LABSPEC 1.013  PHURINE 7.0  GLUCOSEU >500*  HGBUR 1+*  BILIRUBINUR NEGATIVE  KETONESUR TRACE*  PROTEINUR >500*  NITRITE NEGATIVE  LEUKOCYTESUR NEGATIVE      Imaging: Nm Myocar Multi W/spect W/wall Motion / Ef  Result Date: 10/24/2015  There was no ST segment deviation noted during stress.  No T wave inversion was noted during stress.  The study is normal.  This is a low risk study.  The left ventricular ejection fraction is normal (55-65%).      Medications:     . sodium chloride    Intravenous Once  . sodium chloride   Intravenous Once  . acetaminophen  650 mg Oral Once  . amLODipine  10 mg Oral Daily  . atorvastatin  80 mg Oral Daily  . carvedilol  12.5 mg Oral BID  . diphenhydrAMINE  25 mg Oral Once  . furosemide  40 mg Intravenous Once  . gabapentin  100 mg Oral TID  . hydrALAZINE  25 mg Oral TID  . insulin aspart  0-15 Units Subcutaneous TID WC  . insulin aspart  0-5 Units Subcutaneous QHS  . isosorbide mononitrate  30 mg Oral Daily  . levothyroxine  75 mcg Oral QAC breakfast  . metoCLOPramide (REGLAN) injection  10 mg Intravenous Q6H  . pantoprazole  40 mg Oral BID AC  . senna-docusate  2 tablet Oral BID  . sodium chloride flush  3 mL Intravenous Q12H  . traZODone  50 mg Oral QHS   acetaminophen, hydrALAZINE, morphine injection, nitroGLYCERIN, ondansetron (ZOFRAN) IV, sodium chloride flush  Assessment/ Plan:  60 y.o.white female with hypertension, diabetes mellitus type II insulin-dependent, hyperlipidemia, coronary artery disease status post CABG, anemia, congestive heart failure, pulmonary hypertension   1. Acute renal failure on chronic kidney disease stage IV: concern for progression of disease baseline creatinine 3.44 EGFR 14 on 8/14.  With metabolic acidosis and hyponatremia.  Chronic kidney disease secondary to diabetic nephropathy and hypertension.  No acute indication for dialysis.  - start sodium bicarbonate  2. Hypertension: difficult to control. Now better control.  - amlodipine, carvedilol, hydralazine. Holding outpatient lisinopril and furosemide.   3. Anemia of chronic kidney disease.  - Hemoccult ordered - GI to evaluate.   LOS: 0 Jamie Arias 8/22/201711:11 AM

## 2015-10-25 NOTE — Consult Note (Signed)
[name], MD [facility] [address], [place], [place] [address] Phone: [phone] Fax : [phone]  Consultation  Referring Provider:     No ref. provider found Primary Care Physician:  [name], MD Primary Gastroenterologist:  [facility]         Reason for Consultation:     Anemia  Date of Admission:  10/23/2015 Date of Consultation:  10/25/2015         HPI:   Jamie Arias is a 60 y.o. female who is admitted with chest pain and nausea.  The patient has a history of chronic renal disease and congestive heart failure.  The patient was ruled out for any acute cardiac event and reports that her chest pain has gotten better.  The patient recently underwent an upper endoscopy in [place]. She also reports that at the time of the upper endoscopy she was found to have some redness of her stomach but no sign of active bleeding.  The patient also denies ever having a colonoscopy and states that she was given a stool tests to rule out colon cancer.  From her description it sounds like she had cologuard. The patient's hemoglobin on admission was 10 but went down to 7.1.  The repeat this morning was 7.4 and the patient was transfused with one unit of blood with a repeat hemoglobin of  8.5.  The patient's MCV has remained normal.  Past Medical History:  Diagnosis Date  . Anemia   . CHF (congestive heart failure) ([place])   . Chronic kidney disease    EFGR 31  . Chronic systolic heart failure ([place])    a. Due to ischemic cardiomyopathy. EF as low as 35%, improved to normal s/p CABG; b. echo 07/06/13: EF 55-60%, no RWMAs, mod TR, trivial pericardial effusion not c/w tamponade physiology  . Coronary artery disease    a. NSTEMI 06/2013; b.cath: severe three-vessel CAD w/ EF 30% & mild-mod MR; c. s/p 3 vessel CABG 07/02/13 (LIMA-LAD, SVG-OM, and SVG-RPDA).  . Diabetes mellitus without complication ([place])   . Diabetic neuropathy ([place])   . Hyperlipidemia   .  Hypertension   . Hypothyroidism   . Myocardial infarction ([place]) 2015  . Pleural effusion 2015  . Pulmonary hypertension ([place])     Past Surgical History:  Procedure Laterality Date  . CATARACT EXTRACTION Bilateral   . CHOLECYSTECTOMY N/A 12/09/2014   Procedure: LAPAROSCOPIC CHOLECYSTECTOMY;  Surgeon: Marlyce [name], MD;  Location: [facility];  Service: General;  Laterality: N/A;  . CORONARY ARTERY BYPASS GRAFT N/A 07/02/2013   Procedure: CORONARY ARTERY BYPASS GRAFTING (CABG);  Surgeon: Ivin [name], MD;  Location: [facility];  Service: Open Heart Surgery;  Laterality: N/A;  CABG x three, using left internal mammary artery and right leg greater saphenous vein harvested endoscopically  . INTRAOPERATIVE TRANSESOPHAGEAL ECHOCARDIOGRAM N/A 07/02/2013   Procedure: INTRAOPERATIVE TRANSESOPHAGEAL ECHOCARDIOGRAM;  Surgeon: Ivin [name], MD;  Location: [facility];  Service: Open Heart Surgery;  Laterality: N/A;  . THORACENTESIS Left 2015    Prior to Admission medications   Medication Sig Start Date End Date Taking? Authorizing Provider  amLODipine (NORVASC) 5 MG tablet Take 0.5 tablets (2.5 mg total) by mouth daily. 09/02/15  Yes [name], MD  aspirin EC 81 MG tablet Take 1 tablet (81 mg total) by mouth daily. 11/30/14  Yes Wellington Hampshire, MD  atorvastatin (LIPITOR) 80 MG tablet TAKE ONE TABLET BY MOUTH ONCE DAILY Patient taking differently: TAKE  ONE TABLET BY MOUTH ONCE DAILY IN THE AFTERNOON 01/26/15  Yes Wellington Hampshire, MD  carvedilol (COREG) 6.25 MG tablet TAKE ONE TABLET BY MOUTH TWICE DAILY 01/03/15  Yes Wellington Hampshire, MD  furosemide (LASIX) 20 MG tablet Take 1 tablet (20 mg total) by mouth daily. 09/09/15  Yes [name], MD  gabapentin (NEURONTIN) 300 MG capsule TAKE ONE CAPSULE BY MOUTH THREE TIMES DAILY 10/17/15  Yes [name], MD  hydrALAZINE (APRESOLINE) 25 MG tablet Take 1 tablet (25 mg total) by mouth 3 (three) times daily. 09/15/15  Yes Wellington Hampshire, MD    insulin aspart protamine - aspart (NOVOLOG 70/30 MIX) (70-30) 100 UNIT/ML FlexPen Inject 0.08 mLs (8 Units total) into the skin 2 (two) times daily. 06/20/15  Yes Rubbie Battiest, NP  levothyroxine (SYNTHROID, LEVOTHROID) 75 MCG tablet Take 1 tablet (75 mcg total) by mouth daily. 07/01/15  Yes Rubbie Battiest, NP  omeprazole (PRILOSEC) 40 MG capsule Take 1 capsule (40 mg total) by mouth daily. 08/11/15  Yes Amy S Esterwood, PA-C  ondansetron (ZOFRAN) 4 MG tablet TAKE ONE TABLET BY MOUTH ONCE DAILY AS NEEDED FOR NAUSEA AND VOMITING 08/31/15  Yes [name], MD  traZODone (DESYREL) 50 MG tablet TAKE ONE TABLET BY MOUTH AT BEDTIME AS NEEDED FOR SLEEP 10/05/15  Yes [name], MD  bismuth-metronidazole-tetracycline [facility]) [phone] MG capsule Take 3 capsules by mouth 4 (four) times daily -  before meals and at bedtime. Patient not taking: Reported on 10/25/2015 08/25/15   Mauri Pole, MD  blood glucose meter kit and supplies KIT Dispense based on patient and insurance preference. Use up to twice daily. (FOR ICD-10 E11.9) 07/01/15   Rubbie Battiest, NP  glucose blood test strip Test 4 times daily Dx. E11.9. One Touch ultra strips 04/19/14   Rubbie Battiest, NP  NITROSTAT 0.4 MG SL tablet DISSOLVE ONE TABLET UNDER THE TONGUE EVERY 5 MINUTES AS NEEDED FOR CHEST PAIN.  DO NOT EXCEED A TOTAL OF 3 DOSES IN 15 MINUTES 07/06/15   Wellington Hampshire, MD  ondansetron (ZOFRAN-ODT) 8 MG disintegrating tablet DISSOLVE ONE TABLET IN MOUTH EVERY 8 HOURS AS NEEDED FOR NAUSEA AND VOMITING Patient not taking: Reported on 10/25/2015 10/17/15   [name], MD    Family History  Problem Relation Age of Onset  . COPD Mother   . Cancer Mother     Lung  . Pulmonary embolism Father   . Diabetes Father   . Diabetes Paternal Grandfather   . Heart disease Maternal Grandmother   . Colon cancer Neg Hx   . Colon polyps Neg Hx   . Esophageal cancer Neg Hx   . Pancreatic cancer Neg Hx   . Liver disease Neg Hx       Social History  Substance Use Topics  . Smoking status: Never Smoker  . Smokeless tobacco: Never Used  . Alcohol use No    Allergies as of 10/23/2015  . (No Known Allergies)    Review of Systems:    All systems reviewed and negative except where noted in HPI.   Physical Exam:  Vital signs in last 24 hours: Temp:  [97.9 F (36.6 C)-99.4 F (37.4 C)] 99.4 F (37.4 C) (08/22 1458) Pulse Rate:  [62-73] 65 (08/22 1458) Resp:  [14-18] 15 (08/22 1458) BP: (101-166)/(46-73) 116/48 (08/22 1458) SpO2:  [92 %-97 %] 95 % (08/22 1458) Last BM Date: 10/22/15 General:   Pleasant, cooperative in  NAD Head:  Normocephalic and atraumatic. Eyes:   No icterus.   Conjunctiva pink. PERRLA. Ears:  Normal auditory acuity. Neck:  Supple; no masses or thyroidomegaly Lungs: Respirations even and unlabored. Lungs clear to auscultation bilaterally.   No wheezes, crackles, or rhonchi.  Heart:  Regular rate and rhythm;  Without murmur, clicks, rubs or gallops Abdomen:  Soft, nondistended, nontender. Normal bowel sounds. No appreciable masses or hepatomegaly.  No rebound or guarding.  Rectal:  Not performed. Msk:  Symmetrical without gross deformities.    Extremities:  Without edema, cyanosis or clubbing. Neurologic:  Alert and oriented x3;  grossly normal neurologically. Skin:  Intact without significant lesions or rashes. Cervical Nodes:  No significant cervical adenopathy. Psych:  Alert and cooperative. Normal affect.  LAB RESULTS:  Recent Labs  10/23/15 0312 10/24/15 0415 10/24/15 1314 10/25/15 0353 10/25/15 1555  WBC 8.5 7.6  --   --   --   HGB 10.0* 7.1* 8.6* 7.4* 8.5*  HCT 29.1* 20.6* 25.3*  --  24.7*  PLT 232 173  --   --   --    BMET  Recent Labs  10/24/15 0415 10/24/15 1458 10/25/15 1135  NA 137 133* 132*  K 3.8 3.6 4.0  CL 109 106 107  CO2 23 20* 21*  GLUCOSE 142* 150* 132*  BUN 32* 33* 38*  CREATININE 3.89* 4.13* 5.13*  CALCIUM 7.9* 7.9* 7.6*   LFT  Recent  Labs  10/23/15 0312  PROT 6.3*  ALBUMIN 2.8*  AST 23  ALT 11*  ALKPHOS 66  BILITOT 0.6  BILIDIR <0.1*  IBILI NOT CALCULATED   PT/INR No results for input(s): LABPROT, INR in the last 72 hours.  STUDIES: Nm Myocar Multi W/spect W/wall Motion / Ef  Result Date: 10/24/2015  There was no ST segment deviation noted during stress.  No T wave inversion was noted during stress.  The study is normal.  This is a low risk study.  The left ventricular ejection fraction is normal (55-65%).       Impression / Plan:   ATHENA BALTZ is a 60 y.o. y/o female who was admitted with chest pain which has now resolved.  The patient was found to have anemia with her hemoglobin as low as 7.1 with her baseline being around 10.  The patient had an upper endoscopy recently that showed some redness of her stomach.  This was done in [place] despite the patient living in town.  The patient is also upset that she was never given the results of her Cologaurd her primary care provider's office.  The patient has been explained that we will see how she responds to the transfusion and see if her hemoglobin goes down any further.  If it does she may need a upper endoscopy and or colonoscopy.  The patient has reported that she would rather have it done here in town since she had to wait a very long time to be seen in [place] and it was very inconvenient for her to travel there.  The patient has been explained that I would be happy to continue with her care and try to ascertain if and where she may be losing blood.  The patient has been explained the plan and agrees with it.  Thank you for involving me in the care of this patient.      LOS: 0 days   [name], MD  10/25/2015, 7:07 PM  [facility]   Note: This  dictation was prepared with Dragon dictation along with smaller phrase technology. Any transcriptional errors that result from this process are unintentional.  

## 2015-10-25 NOTE — Progress Notes (Addendum)
[place] [facility] [place] NAME: Jamie Arias    MR#:  [id number]  DATE OF BIRTH:  1956-03-05  SUBJECTIVE:  CHIEF COMPLAINT:   Chief Complaint  Patient presents with  . Chest Pain  . Nausea   Chest pain and vomiting resolved. Feels much better. Fatigue.  Patient mentions she has good urine output  REVIEW OF SYSTEMS:    Review of Systems  Constitutional: Positive for malaise/fatigue. Negative for chills and fever.  HENT: Negative for sore throat.   Eyes: Negative for blurred vision, double vision and pain.  Respiratory: Negative for cough, hemoptysis, shortness of breath and wheezing.   Cardiovascular: Negative for chest pain, palpitations, orthopnea and leg swelling.  Gastrointestinal: Positive for constipation. Negative for abdominal pain, diarrhea and vomiting.  Genitourinary: Negative for dysuria and hematuria.  Musculoskeletal: Negative for back pain and joint pain.  Skin: Negative for rash.  Neurological: Positive for weakness. Negative for sensory change, speech change, focal weakness and headaches.  Endo/Heme/Allergies: Does not bruise/bleed easily.  Psychiatric/Behavioral: Negative for depression. The patient is not nervous/anxious.      DRUG ALLERGIES:  No Known Allergies  VITALS:  Blood pressure (!) 119/56, pulse 65, temperature 98.6 F (37 C), temperature source Oral, resp. rate 14, height 5\' 4"  (1.626 m), weight 90.7 kg (200 lb), SpO2 94 %.  PHYSICAL EXAMINATION:   Physical Exam  GENERAL:  60 y.o.-year-old patient lying in the bed with Severe distress due to pain and vomiting EYES: Pupils equal, round, reactive to light and accommodation. No scleral icterus. Extraocular muscles intact.  HEENT: Head atraumatic, normocephalic. Oropharynx and nasopharynx clear.  NECK:  Supple, no jugular venous distention. No thyroid enlargement, no tenderness.  LUNGS: Normal breath sounds bilaterally, no wheezing, rales,  rhonchi. No use of accessory muscles of respiration.  CARDIOVASCULAR: S1, S2 normal. No murmurs, rubs, or gallops.  ABDOMEN: Soft, nontender, nondistended. Bowel sounds present. No organomegaly or mass.  EXTREMITIES: No cyanosis, clubbing or edema b/l.    NEUROLOGIC: Cranial nerves II through XII are intact. No focal Motor or sensory deficits b/l.   PSYCHIATRIC: The patient is alert and oriented x 3.  SKIN: No obvious rash, lesion, or ulcer.   LABORATORY PANEL:   CBC  Recent Labs Lab 10/24/15 0415 10/24/15 1314 10/25/15 0353  WBC 7.6  --   --   HGB 7.1* 8.6* 7.4*  HCT 20.6* 25.3*  --   PLT 173  --   --    ------------------------------------------------------------------------------------------------------------------ Chemistries   Recent Labs Lab 10/23/15 0312  10/24/15 1458  NA 135  < > 133*  K 3.9  < > 3.6  CL 105  < > 106  CO2 20*  < > 20*  GLUCOSE 227*  < > 150*  BUN 28*  < > 33*  CREATININE 3.36*  < > 4.13*  CALCIUM 8.7*  < > 7.9*  AST 23  --   --   ALT 11*  --   --   ALKPHOS 66  --   --   BILITOT 0.6  --   --   < > = values in this interval not displayed. ------------------------------------------------------------------------------------------------------------------  Cardiac Enzymes  Recent Labs Lab 10/23/15 2030  TROPONINI 0.06*   ------------------------------------------------------------------------------------------------------------------  RADIOLOGY:  Nm Myocar Multi W/spect W/wall Motion / Ef  Result Date: 10/24/2015  There was no ST segment deviation noted during stress.  No T wave inversion was noted during stress.  The study is normal.  This is a low risk study.  The left ventricular ejection fraction is normal (55-65%).      ASSESSMENT AND PLAN:   * Acute anemia over chronic anemia Etiology unclear. Stool for Hemoccult was indeterminate as patient did not have any stool in the rectal vault on rectal exam. Specks of stool seemed  black We'll check Hemoccult stool and patient has a bowel movement. Blood was not transfused yesterday as hemoglobin remained stable. Transfuse 1 unit packed RBC today. Hold heparin and aspirin. PPI. Patient had an EGD done recently and had H pylori. No ulcers.  Discussed with Dr. [place] of GI regarding EGD.  * Acute kidney injury over Chronic kidney disease stage III - worsening Could be due to accelerated hypertension. Progressively worsening CKD. Monitor strict  input and output. Repeat labs in the morning.  Discussed with Dr. Juleen China   * Accelerated hypertension Resumed Coreg and hydralazine Improved.  * Vomiting likely secondary to gastroparesis Lipase normal. CT scan of the abdomen pelvis showed nothing acute. Resolved with PPI and scheduled reglan.  * Chest pain likely due to GERD and gastroparesis Cardiology consulted. Troponin 2 normal. EKG shows nothing acute. Stress test - Normal  *  chronic Systolic heart failure No signs of fluid overload.  * Coronary artery disease  * Hyperlipidemia  All the records are reviewed and case discussed with Care Management/Social Workerr. Management plans discussed with the patient, family and they are in agreement.  CODE STATUS: FULL CODE  DVT Prophylaxis: SCDs  TOTAL CC TIME TAKING CARE OF THIS PATIENT: 35 minutes.   POSSIBLE D/C IN 2-3 DAYS, DEPENDING ON CLINICAL CONDITION.  Hillary Bow R M.D on 10/25/2015 at 12:18 PM  Between 7am to 6pm - Pager - [phone]  After 6pm go to [email or web address] - password [email or web address] [facility]  Office  [phone]  CC: Primary care physician; [name], MD  Note: This dictation was prepared with Dragon dictation along with smaller phrase technology. Any transcriptional errors that result from this process are unintentional.

## 2015-10-26 LAB — TYPE AND SCREEN
ABO/RH(D): A POS
Antibody Screen: NEGATIVE
Unit division: 0

## 2015-10-26 LAB — CBC WITH DIFFERENTIAL/PLATELET
Basophils Absolute: 0.1 10*3/uL (ref 0–0.1)
Basophils Relative: 2 %
Eosinophils Absolute: 0.3 10*3/uL (ref 0–0.7)
Eosinophils Relative: 5 %
HCT: 24.1 % — ABNORMAL LOW (ref 35.0–47.0)
Hemoglobin: 8.4 g/dL — ABNORMAL LOW (ref 12.0–16.0)
Lymphocytes Relative: 18 %
Lymphs Abs: 1.2 10*3/uL (ref 1.0–3.6)
MCH: 31 pg (ref 26.0–34.0)
MCHC: 34.8 g/dL (ref 32.0–36.0)
MCV: 89.2 fL (ref 80.0–100.0)
Monocytes Absolute: 0.6 10*3/uL (ref 0.2–0.9)
Monocytes Relative: 9 %
Neutro Abs: 4.5 10*3/uL (ref 1.4–6.5)
Neutrophils Relative %: 66 %
Platelets: 153 10*3/uL (ref 150–440)
RBC: 2.7 MIL/uL — ABNORMAL LOW (ref 3.80–5.20)
RDW: 15.3 % — ABNORMAL HIGH (ref 11.5–14.5)
WBC: 6.8 10*3/uL (ref 3.6–11.0)

## 2015-10-26 LAB — RENAL FUNCTION PANEL
Albumin: 2.1 g/dL — ABNORMAL LOW (ref 3.5–5.0)
Anion gap: 7 (ref 5–15)
BUN: 42 mg/dL — ABNORMAL HIGH (ref 6–20)
CO2: 20 mmol/L — ABNORMAL LOW (ref 22–32)
Calcium: 7.5 mg/dL — ABNORMAL LOW (ref 8.9–10.3)
Chloride: 108 mmol/L (ref 101–111)
Creatinine, Ser: 5.29 mg/dL — ABNORMAL HIGH (ref 0.44–1.00)
GFR calc Af Amer: 9 mL/min — ABNORMAL LOW (ref >60)
GFR calc non Af Amer: 8 mL/min — ABNORMAL LOW (ref >60)
Glucose, Bld: 123 mg/dL — ABNORMAL HIGH (ref 65–99)
Phosphorus: 5.1 mg/dL — ABNORMAL HIGH (ref 2.5–4.6)
Potassium: 4 mmol/L (ref 3.5–5.1)
Sodium: 135 mmol/L (ref 135–145)

## 2015-10-26 LAB — GLUCOSE, CAPILLARY: Glucose-Capillary: 123 mg/dL — ABNORMAL HIGH (ref 65–99)

## 2015-10-26 MED ORDER — SODIUM BICARBONATE 650 MG PO TABS: 650.0000 mg | ORAL_TABLET | Freq: Two times a day (BID) | ORAL | 0 refills | Status: DC

## 2015-10-26 MED ORDER — METOCLOPRAMIDE HCL 5 MG PO TABS: 5.0000 mg | ORAL_TABLET | Freq: Three times a day (TID) | ORAL | 0 refills | Status: DC | PRN

## 2015-10-26 MED ORDER — OMEPRAZOLE 40 MG PO CPDR: 40.0000 mg | DELAYED_RELEASE_CAPSULE | Freq: Two times a day (BID) | ORAL | 0 refills | Status: DC

## 2015-10-26 MED ORDER — SENNOSIDES-DOCUSATE SODIUM 8.6-50 MG PO TABS: 2.0000 | ORAL_TABLET | Freq: Two times a day (BID) | ORAL | 0 refills | Status: DC

## 2015-10-26 MED ORDER — ISOSORBIDE MONONITRATE ER 30 MG PO TB24: 30.0000 mg | ORAL_TABLET | Freq: Every day | ORAL | 0 refills | Status: DC

## 2015-10-26 NOTE — Discharge Summary (Signed)
[facility] [place] at [facility]   PATIENT NAME: Jamie Arias    MR#:  [id number]  DATE OF BIRTH:  02/16/1956  DATE OF ADMISSION:  10/23/2015 ADMITTING PHYSICIAN: Pavan Pyreddy, MD  DATE OF DISCHARGE: 10/26/2015 12:45 PM  PRIMARY CARE PHYSICIAN: No primary care provider on file.   ADMISSION DIAGNOSIS:  Essential hypertension [I10] CKD (chronic kidney disease), stage 3 (moderate) [N18.3] Coronary artery disease involving coronary bypass graft of native heart with unstable angina pectoris (HCC) [I25.700] Chest pain, unspecified chest pain type [R07.9] Non-intractable vomiting with nausea, vomiting of unspecified type [R11.2]  DISCHARGE DIAGNOSIS:  Principal Problem:   Unstable angina (HCC) Active Problems:   HLD (hyperlipidemia)   Diabetes mellitus, type 2 (HCC)   CAD (coronary artery disease)   CKD (chronic kidney disease), stage III   Hypertensive heart disease   Chronic diastolic CHF (congestive heart failure) (HCC)   GI bleed   SECONDARY DIAGNOSIS:   Past Medical History:  Diagnosis Date  . Anemia   . CHF (congestive heart failure) (HCC)   . Chronic kidney disease    EFGR 31  . Chronic systolic heart failure (HCC)    a. Due to ischemic cardiomyopathy. EF as low as 35%, improved to normal s/p CABG; b. echo 07/06/13: EF 55-60%, no RWMAs, mod TR, trivial pericardial effusion not c/w tamponade physiology  . Coronary artery disease    a. NSTEMI 06/2013; b.cath: severe three-vessel CAD w/ EF 30% & mild-mod MR; c. s/p 3 vessel CABG 07/02/13 (LIMA-LAD, SVG-OM, and SVG-RPDA).  . Diabetes mellitus without complication (HCC)   . Diabetic neuropathy (HCC)   . Hyperlipidemia   . Hypertension   . Hypothyroidism   . Myocardial infarction (HCC) 2015  . Pleural effusion 2015  . Pulmonary hypertension (HCC)      ADMITTING HISTORY  HISTORY OF PRESENT ILLNESS: Jamie Arias  is a 59 y.o. female with a known history of Chronic kidney disease stage III,  congestive heart failure, cardiomyopathy with EF of 35%, coronary artery disease with CABG, diabetes mellitus type 2, hyperlipidemia, hypertension presented to the emergency room with chest pain. The chest pain started around 7 PM yesterday evening and she was watching television at home . The pain is located substernally, height is 4 out of 10 on a scale of 1-10. The pain is pressure-like sensation. Patient did not take blood pressure medication for the last few months and she presented to the emergency room today her blood pressure was high. She also complains of dry heaves and also felt nauseated and had an episode of emesis. Patient was worked up with CT abdomen and chest which did not show any acute abnormality. Her first set of troponin was negative. Hospitalist service was consulted for further care of the patient. No complaints of any shortness of breath or orthopnea.  HOSPITAL COURSE:   * Acute anemia over chronic anemia Etiology unclear. Stool for Hemoccult was indeterminate as patient did not have any stool in the rectal vault on rectal exam. Transfused 1 unit packed RBC . Started on PPI Patient had an EGD done recently and had H pylori. No ulcers.  Discussed with Dr. Wohl of GI regarding EGD. With stable hemoglobin and no further bleeding patient is being referred as outpatient with GI clinic. Continue PPI. Hold aspirin. Repeat hemoglobin is checked. Aspirin can be restarted if repeat hemoglobin stable.  * Acute kidney injury over Chronic kidney disease stage III - worsening Could be due to accelerated hypertension. Progressively   worsening CKD. Discussed with Dr. Juleen China. Patient does not have any shortness of breath or lower extremity edema. No uremic symptoms. And is to follow up as outpatient in the nephrology clinic and likely patient will need dialysis in the future.   * Accelerated hypertension Resumed Coreg and hydralazine Improved.  *Vomiting likely secondary to  gastroparesis Lipase normal. CT scan of the abdomen pelvis showed nothing acute. Resolved with PPI and scheduled reglan. Prescription given for when necessary Reglan.  * Chest pain likely due to GERD and gastroparesis Cardiology consulted. Troponin 2 normal. EKG shows nothing acute. Stress test - Normal  * chronic Systolic heart failure No signs of fluid overload.  *Coronary artery disease  *Hyperlipidemia  Stable for discharge home to follow-up with primary care physician, GI and nephrology. Patient has been advised to return to emergency room if she notices any black stools or blood in stools.  CONSULTS OBTAINED:  Treatment Team:  Dagoberto Ligas, MD [name], MD  DRUG ALLERGIES:  No Known Allergies  DISCHARGE MEDICATIONS:   Discharge Medication List as of 10/26/2015 11:18 AM    START taking these medications   Details  isosorbide mononitrate (IMDUR) 30 MG 24 hr tablet Take 1 tablet (30 mg total) by mouth daily., Starting Wed 10/26/2015, Normal    metoCLOPramide (REGLAN) 5 MG tablet Take 1 tablet (5 mg total) by mouth every 8 (eight) hours as needed for refractory nausea / vomiting., Starting Wed 10/26/2015, Normal    senna-docusate (SENOKOT-S) 8.6-50 MG tablet Take 2 tablets by mouth 2 (two) times daily., Starting Wed 10/26/2015, Normal    sodium bicarbonate 650 MG tablet Take 1 tablet (650 mg total) by mouth 2 (two) times daily., Starting Wed 10/26/2015, Normal      CONTINUE these medications which have CHANGED   Details  omeprazole (PRILOSEC) 40 MG capsule Take 1 capsule (40 mg total) by mouth 2 (two) times daily before a meal., Starting Wed 10/26/2015, Normal      CONTINUE these medications which have NOT CHANGED   Details  amLODipine (NORVASC) 5 MG tablet Take 0.5 tablets (2.5 mg total) by mouth daily., Starting 09/02/2015, Until Discontinued, Normal    aspirin EC 81 MG tablet Take 1 tablet (81 mg total) by mouth daily., Starting 11/30/2014, Until  Discontinued, No Print    atorvastatin (LIPITOR) 80 MG tablet TAKE ONE TABLET BY MOUTH ONCE DAILY, Normal    carvedilol (COREG) 6.25 MG tablet TAKE ONE TABLET BY MOUTH TWICE DAILY, Normal    furosemide (LASIX) 20 MG tablet Take 1 tablet (20 mg total) by mouth daily., Starting Fri 09/09/2015, Normal    gabapentin (NEURONTIN) 300 MG capsule TAKE ONE CAPSULE BY MOUTH THREE TIMES DAILY, Normal    hydrALAZINE (APRESOLINE) 25 MG tablet Take 1 tablet (25 mg total) by mouth 3 (three) times daily., Starting Thu 09/15/2015, Normal    insulin aspart protamine - aspart (NOVOLOG 70/30 MIX) (70-30) 100 UNIT/ML FlexPen Inject 0.08 mLs (8 Units total) into the skin 2 (two) times daily., Starting 06/20/2015, Until Discontinued, Normal    levothyroxine (SYNTHROID, LEVOTHROID) 75 MCG tablet Take 1 tablet (75 mcg total) by mouth daily., Starting 07/01/2015, Until Discontinued, Normal    ondansetron (ZOFRAN) 4 MG tablet TAKE ONE TABLET BY MOUTH ONCE DAILY AS NEEDED FOR NAUSEA AND VOMITING, Normal    traZODone (DESYREL) 50 MG tablet TAKE ONE TABLET BY MOUTH AT BEDTIME AS NEEDED FOR SLEEP, Normal    blood glucose meter kit and supplies KIT Dispense based on patient and  insurance preference. Use up to twice daily. (FOR ICD-10 E11.9), Print    glucose blood test strip Test 4 times daily Dx. E11.9. One Touch ultra strips, Normal    NITROSTAT 0.4 MG SL tablet DISSOLVE ONE TABLET UNDER THE TONGUE EVERY 5 MINUTES AS NEEDED FOR CHEST PAIN.  DO NOT EXCEED A TOTAL OF 3 DOSES IN 15 MINUTES, Normal    ondansetron (ZOFRAN-ODT) 8 MG disintegrating tablet DISSOLVE ONE TABLET IN MOUTH EVERY 8 HOURS AS NEEDED FOR NAUSEA AND VOMITING, Normal      STOP taking these medications     bismuth-metronidazole-tetracycline (PYLERA) 140-125-125 MG capsule         Today   VITAL SIGNS:  Blood pressure (!) 115/59, pulse (!) 59, temperature 97.8 F (36.6 C), resp. rate 18, height 5' 4" (1.626 m), weight 90.7 kg (200 lb), SpO2 94  %.  I/O:   Intake/Output Summary (Last 24 hours) at 10/26/15 1450 Last data filed at 10/26/15 1100  Gross per 24 hour  Intake           810.42 ml  Output              750 ml  Net            60.42 ml    PHYSICAL EXAMINATION:  Physical Exam  GENERAL:  59 y.o.-year-old patient lying in the bed with no acute distress.  LUNGS: Normal breath sounds bilaterally, no wheezing, rales,rhonchi or crepitation. No use of accessory muscles of respiration.  CARDIOVASCULAR: S1, S2 normal. No murmurs, rubs, or gallops.  ABDOMEN: Soft, non-tender, non-distended. Bowel sounds present. No organomegaly or mass.  NEUROLOGIC: Moves all 4 extremities. PSYCHIATRIC: The patient is alert and oriented x 3.  SKIN: No obvious rash, lesion, or ulcer.   DATA REVIEW:   CBC  Recent Labs Lab 10/26/15 0402  WBC 6.8  HGB 8.4*  HCT 24.1*  PLT 153    Chemistries   Recent Labs Lab 10/23/15 0312  10/26/15 0402  NA 135  < > 135  K 3.9  < > 4.0  CL 105  < > 108  CO2 20*  < > 20*  GLUCOSE 227*  < > 123*  BUN 28*  < > 42*  CREATININE 3.36*  < > 5.29*  CALCIUM 8.7*  < > 7.5*  AST 23  --   --   ALT 11*  --   --   ALKPHOS 66  --   --   BILITOT 0.6  --   --   < > = values in this interval not displayed.  Cardiac Enzymes  Recent Labs Lab 10/23/15 2030  TROPONINI 0.06*    Microbiology Results  Results for orders placed or performed during the hospital encounter of 06/29/13  MRSA PCR Screening     Status: None   Collection Time: 06/29/13  8:45 PM  Result Value Ref Range Status   MRSA by PCR NEGATIVE NEGATIVE Final    Comment:        The GeneXpert MRSA Assay (FDA approved for NASAL specimens only), is one component of a comprehensive MRSA colonization surveillance program. It is not intended to diagnose MRSA infection nor to guide or monitor treatment for MRSA infections.  Surgical pcr screen     Status: None   Collection Time: 06/30/13  8:50 AM  Result Value Ref Range Status   MRSA, PCR  NEGATIVE NEGATIVE Final   Staphylococcus aureus NEGATIVE NEGATIVE Final    Comment:          The Xpert SA Assay (FDA approved for NASAL specimens in patients over 74 years of age), is one component of a comprehensive surveillance program.  Test performance has been validated by [employer] for patients greater than or equal to 54 year old. It is not intended to diagnose infection nor to guide or monitor treatment.    RADIOLOGY:  No results found.  Follow up with PCP in 1 week.  Management plans discussed with the patient, family and they are in agreement.  CODE STATUS:     Code Status Orders        Start     Ordered   10/23/15 0828  Full code  Continuous     10/23/15 0828    Code Status History    Date Active Date Inactive Code Status Order ID Comments User Context   10/23/2015  8:28 AM 10/23/2015  9:08 AM Full Code [id number]  Saundra Shelling, MD Inpatient   09/03/2015  1:30 AM 09/03/2015  4:19 PM Full Code [id number]  Lance Coon, MD Inpatient   07/02/2013  1:27 PM 07/14/2013  3:40 PM Full Code [id number]  Nani Skillern, PA-C Inpatient   06/29/2013  9:53 PM 07/02/2013  1:27 PM Full Code [id number]  Larey Dresser, MD Inpatient     TOTAL TIME TAKING CARE OF THIS PATIENT ON DAY OF DISCHARGE: more than 30 minutes.   Hillary Bow R M.D on 10/26/2015 at 2:50 PM  Between 7am to 6pm - Pager - [phone]  After 6pm go to [email or web address] - password [email or web address] [facility]  [place] [facility]  Office  [phone]  VQ:[id number] care physician; No primary care provider on file.  Note: This dictation was prepared with Dragon dictation along with smaller phrase technology. Any transcriptional errors that result from this process are unintentional.

## 2015-10-26 NOTE — Progress Notes (Signed)
[facility]  ROUNDING NOTE   Subjective:   Sitting comfortably in chair. Denies any shortness of breath, chest pain or peripheral edema  Creatinine 5.29 (5.13)  Objective:  Vital signs in last 24 hours:  Temp:  [97.8 F (36.6 C)-99.4 F (37.4 C)] 97.8 F (36.6 C) (08/23 0438) Pulse Rate:  [59-68] 59 (08/23 0438) Resp:  [14-18] 18 (08/23 0438) BP: (115-158)/(48-66) 115/59 (08/23 0438) SpO2:  [94 %-97 %] 94 % (08/23 0438)  Weight change:  Filed Weights   10/23/15 0256  Weight: 90.7 kg (200 lb)    Intake/Output: I/O last 3 completed shifts: In: 810.4 [P.O.:480; I.V.:20; Blood:310.4] Out: 450 [Urine:450]   Intake/Output this shift:  No intake/output data recorded.  Physical Exam: General: Sitting in chair  Head: Normocephalic, atraumatic. Moist oral mucosal membranes  Eyes: Anicteric  Neck: Supple, trachea midline  Lungs:  clear, normal effort  Heart: S1S2 no rubs  Abdomen:  Soft, nontender, BS present   Extremities: No peripheral edema.  Neurologic: Nonfocal, moving all four extremities  Skin: No lesions  Access: none    Basic Metabolic Panel:  Recent Labs Lab 10/23/15 0312 10/23/15 0834 10/24/15 0415 10/24/15 1458 10/25/15 1135 10/26/15 0402  NA 135  --  137 133* 132* 135  K 3.9  --  3.8 3.6 4.0 4.0  CL 105  --  109 106 107 108  CO2 20*  --  23 20* 21* 20*  GLUCOSE 227*  --  142* 150* 132* 123*  BUN 28*  --  32* 33* 38* 42*  CREATININE 3.36* 3.40* 3.89* 4.13* 5.13* 5.29*  CALCIUM 8.7*  --  7.9* 7.9* 7.6* 7.5*  PHOS  --   --   --   --   --  5.1*    Liver Function Tests:  Recent Labs Lab 10/23/15 0312 10/26/15 0402  AST 23  --   ALT 11*  --   ALKPHOS 66  --   BILITOT 0.6  --   PROT 6.3*  --   ALBUMIN 2.8* 2.1*    Recent Labs Lab 10/23/15 0312  LIPASE 12   No results for input(s): AMMONIA in the last 168 hours.  CBC:  Recent Labs Lab 10/23/15 0312 10/24/15 0415 10/24/15 1314 10/25/15 0353 10/25/15 1555  10/26/15 0402  WBC 8.5 7.6  --   --   --  6.8  NEUTROABS  --   --   --   --   --  4.5  HGB 10.0* 7.1* 8.6* 7.4* 8.5* 8.4*  HCT 29.1* 20.6* 25.3*  --  24.7* 24.1*  MCV 87.2 88.0  --   --   --  89.2  PLT 232 173  --   --   --  153    Cardiac Enzymes:  Recent Labs Lab 10/23/15 0312 10/23/15 0834 10/23/15 1419 10/23/15 2030  TROPONINI <0.03 <0.03 0.04* 0.06*    BNP: Invalid input(s): POCBNP  CBG:  Recent Labs Lab 10/25/15 0730 10/25/15 1144 10/25/15 1636 10/25/15 2058 10/26/15 0713  GLUCAP 206* 155* 112* 149* 80*    Microbiology: Results for orders placed or performed during the hospital encounter of 06/29/13  MRSA PCR Screening     Status: None   Collection Time: 06/29/13  8:45 PM  Result Value Ref Range Status   MRSA by PCR NEGATIVE NEGATIVE Final    Comment:        The GeneXpert MRSA Assay (FDA approved for NASAL specimens only), is one component of a comprehensive MRSA  colonization surveillance program. It is not intended to diagnose MRSA infection nor to guide or monitor treatment for MRSA infections.  Surgical pcr screen     Status: None   Collection Time: 06/30/13  8:50 AM  Result Value Ref Range Status   MRSA, PCR NEGATIVE NEGATIVE Final   Staphylococcus aureus NEGATIVE NEGATIVE Final    Comment:        The Xpert SA Assay (FDA approved for NASAL specimens in patients over 31 years of age), is one component of a comprehensive surveillance program.  Test performance has been validated by [employer] for patients greater than or equal to 60 year old. It is not intended to diagnose infection nor to guide or monitor treatment.    Coagulation Studies: No results for input(s): LABPROT, INR in the last 72 hours.  Urinalysis: No results for input(s): COLORURINE, LABSPEC, PHURINE, GLUCOSEU, HGBUR, BILIRUBINUR, KETONESUR, PROTEINUR, UROBILINOGEN, NITRITE, LEUKOCYTESUR in the last 72 hours.  Invalid input(s): APPERANCEUR    Imaging: Nm Myocar  Multi W/spect W/wall Motion / Ef  Result Date: 10/24/2015  There was no ST segment deviation noted during stress.  No T wave inversion was noted during stress.  The study is normal.  This is a low risk study.  The left ventricular ejection fraction is normal (55-65%).      Medications:     . sodium chloride   Intravenous Once  . acetaminophen  650 mg Oral Once  . amLODipine  10 mg Oral Daily  . atorvastatin  80 mg Oral Daily  . carvedilol  12.5 mg Oral BID  . furosemide  40 mg Intravenous Once  . gabapentin  100 mg Oral TID  . insulin aspart  0-15 Units Subcutaneous TID WC  . insulin aspart  0-5 Units Subcutaneous QHS  . isosorbide mononitrate  30 mg Oral Daily  . levothyroxine  75 mcg Oral QAC breakfast  . metoCLOPramide  5 mg Oral TID AC  . pantoprazole  40 mg Oral BID AC  . senna-docusate  2 tablet Oral BID  . sodium bicarbonate  650 mg Oral BID  . sodium chloride flush  3 mL Intravenous Q12H  . traZODone  50 mg Oral QHS   acetaminophen, hydrALAZINE, morphine injection, nitroGLYCERIN, ondansetron (ZOFRAN) IV, sodium chloride flush  Assessment/ Plan:  60 y.o.white female with hypertension, diabetes mellitus type II insulin-dependent, hyperlipidemia, coronary artery disease status post CABG, anemia, congestive heart failure, pulmonary hypertension   1. Acute renal failure on chronic kidney disease stage IV: concern for progression of disease baseline creatinine 3.44 EGFR 14 on 8/14.  With metabolic acidosis and hyponatremia.  Chronic kidney disease secondary to diabetic nephropathy and hypertension.  No acute indication for dialysis.  - Continue sodium bicarbonate  2. Hypertension: difficult to control. Now better control.  - amlodipine, carvedilol, hydralazine. Holding outpatient lisinopril and furosemide.   3. Anemia of chronic kidney disease.  - GI to evaluate.   LOS: [place], [place] 8/23/20179:43 AM

## 2015-10-26 NOTE — Progress Notes (Signed)
Patient is alert and oriented, vss, no complaints of pain.  D/C telemetry and removed PIV.  Rx called to [facility].  No questions at this time.  Patient escorted out of hospital via wheelchair by volunteers.

## 2015-10-27 ENCOUNTER — Telehealth: Payer: Self-pay

## 2015-10-27 ENCOUNTER — Other Ambulatory Visit: Payer: Self-pay

## 2015-10-27 NOTE — Telephone Encounter (Signed)
Gastroenterology Pre-Procedure Review  Request Date: 11/24/2015 Requesting Physician:   PATIENT REVIEW QUESTIONS: The patient responded to the following health history questions as indicated:    1. Are you having any GI issues? no 2. Do you have a personal history of Polyps? no 3. Do you have a family history of Colon Cancer or Polyps? no 4. Diabetes Mellitus? yes (Type 2) 5. Joint replacements in the past 12 months?no 6. Major health problems in the past 3 months?yes ( Was having abdominal and chest pains. They ran tests and everything was clear) 7. Any artificial heart valves, MVP, or defibrillator?no    MEDICATIONS & ALLERGIES:    Patient reports the following regarding taking any anticoagulation/antiplatelet therapy:   Plavix, Coumadin, Eliquis, Xarelto, Lovenox, Pradaxa, Brilinta, or Effient? no Aspirin? no  Patient confirms/reports the following medications:  Current Outpatient Prescriptions  Medication Sig Dispense Refill  . amLODipine (NORVASC) 5 MG tablet Take 0.5 tablets (2.5 mg total) by mouth daily. 45 tablet 3  . atorvastatin (LIPITOR) 80 MG tablet TAKE ONE TABLET BY MOUTH ONCE DAILY (Patient taking differently: TAKE ONE TABLET BY MOUTH ONCE DAILY IN THE AFTERNOON) 90 tablet 3  . blood glucose meter kit and supplies KIT Dispense based on patient and insurance preference. Use up to twice daily. (FOR ICD-10 E11.9) 1 each 0  . carvedilol (COREG) 6.25 MG tablet TAKE ONE TABLET BY MOUTH TWICE DAILY 180 tablet 3  . furosemide (LASIX) 20 MG tablet   0  . gabapentin (NEURONTIN) 300 MG capsule TAKE ONE CAPSULE BY MOUTH THREE TIMES DAILY 90 capsule 3  . glucose blood test strip Test 4 times daily Dx. E11.9. One Touch ultra strips 100 each 12  . hydrALAZINE (APRESOLINE) 25 MG tablet Take 1 tablet (25 mg total) by mouth 3 (three) times daily. 90 tablet 2  . insulin aspart protamine - aspart (NOVOLOG 70/30 MIX) (70-30) 100 UNIT/ML FlexPen Inject 0.08 mLs (8 Units total) into the skin 2  (two) times daily. 15 mL 11  . isosorbide mononitrate (IMDUR) 30 MG 24 hr tablet Take 1 tablet (30 mg total) by mouth daily. 30 tablet 0  . levothyroxine (SYNTHROID, LEVOTHROID) 75 MCG tablet Take 1 tablet (75 mcg total) by mouth daily. 90 tablet 1  . lisinopril (PRINIVIL,ZESTRIL) 5 MG tablet   0  . metoCLOPramide (REGLAN) 5 MG tablet Take 1 tablet (5 mg total) by mouth every 8 (eight) hours as needed for refractory nausea / vomiting. 30 tablet 0  . NITROSTAT 0.4 MG SL tablet DISSOLVE ONE TABLET UNDER THE TONGUE EVERY 5 MINUTES AS NEEDED FOR CHEST PAIN.  DO NOT EXCEED A TOTAL OF 3 DOSES IN 15 MINUTES 25 tablet 1  . omeprazole (PRILOSEC) 40 MG capsule Take 1 capsule (40 mg total) by mouth 2 (two) times daily before a meal. 60 capsule 0  . ondansetron (ZOFRAN-ODT) 8 MG disintegrating tablet DISSOLVE ONE TABLET IN MOUTH EVERY 8 HOURS AS NEEDED FOR NAUSEA AND VOMITING 30 tablet 0  . senna-docusate (SENOKOT-S) 8.6-50 MG tablet Take 2 tablets by mouth 2 (two) times daily. 60 tablet 0  . sodium bicarbonate 650 MG tablet Take 1 tablet (650 mg total) by mouth 2 (two) times daily. 60 tablet 0  . traZODone (DESYREL) 50 MG tablet TAKE ONE TABLET BY MOUTH AT BEDTIME AS NEEDED FOR SLEEP 30 tablet 3  . Vitamin D, Ergocalciferol, (DRISDOL) 50000 units CAPS capsule   0   No current facility-administered medications for this visit.     Patient  confirms/reports the following allergies:  No Known Allergies  No orders of the defined types were placed in this encounter.   AUTHORIZATION INFORMATION Primary Insurance: 1D#: Group #:  Secondary Insurance: 1D#: Group #:  SCHEDULE INFORMATION: Date: 11/24/2015 Time: Location: [facility]

## 2015-10-27 NOTE — Telephone Encounter (Signed)
H. Pylori B96.81 (EGD) [facility] [id number] Please pre cert

## 2015-11-04 ENCOUNTER — Ambulatory Visit: Payer: BLUE CROSS/BLUE SHIELD | Admitting: Family Medicine

## 2015-11-04 ENCOUNTER — Telehealth: Payer: Self-pay | Admitting: Family Medicine

## 2015-11-04 NOTE — Telephone Encounter (Signed)
Pt husband called stating he went out of town and forgot to cancel appt. Pt does have another appt scheduled for 11/30/15. Let me know if appt needs to be cancelled? Thank you!

## 2015-11-04 NOTE — Telephone Encounter (Signed)
Patient taken off of the schedule. Do not charge no show fee

## 2015-11-14 ENCOUNTER — Ambulatory Visit: Payer: BLUE CROSS/BLUE SHIELD | Admitting: Family Medicine

## 2015-11-17 ENCOUNTER — Encounter: Payer: Self-pay | Admitting: *Deleted

## 2015-11-17 ENCOUNTER — Ambulatory Visit: Payer: BLUE CROSS/BLUE SHIELD | Admitting: Cardiovascular Disease

## 2015-11-22 ENCOUNTER — Telehealth: Payer: Self-pay | Admitting: Gastroenterology

## 2015-11-22 NOTE — Telephone Encounter (Signed)
°  Patient called with list of medications.  Amlodipine 5 mg  Aspirin 81 mg  Lipitor 80 mg  Insulin - Novolog 8 units twice a day   Carvedilol 6.25mg    Lasix  20 mg  Hydralazine 25 mg   Levothyroxine 75 mcg   Nitroseat 0.4 mg  Prilosec 40 mg  Zofran 4 mg  Phenergan 12.5mg   Trazodone 50 mg

## 2015-11-22 NOTE — Telephone Encounter (Signed)
Received. Medication list has been updated.

## 2015-11-22 NOTE — Discharge Instructions (Signed)
General Anesthesia, Adult, Care After °Refer to this sheet in the next few weeks. These instructions provide you with information on caring for yourself after your procedure. Your health care provider may also give you more specific instructions. Your treatment has been planned according to current medical practices, but problems sometimes occur. Call your health care provider if you have any problems or questions after your procedure. °WHAT TO EXPECT AFTER THE PROCEDURE °After the procedure, it is typical to experience: °· Sleepiness. °· Nausea and vomiting. °HOME CARE INSTRUCTIONS °· For the first 24 hours after general anesthesia: °¨ Have a responsible person with you. °¨ Do not drive a car. If you are alone, do not take public transportation. °¨ Do not drink alcohol. °¨ Do not take medicine that has not been prescribed by your health care provider. °¨ Do not sign important papers or make important decisions. °¨ You may resume a normal diet and activities as directed by your health care provider. °· Change bandages (dressings) as directed. °· If you have questions or problems that seem related to general anesthesia, call the hospital and ask for the anesthetist or anesthesiologist on call. °SEEK MEDICAL CARE IF: °· You have nausea and vomiting that continue the day after anesthesia. °· You develop a rash. °SEEK IMMEDIATE MEDICAL CARE IF:  °· You have difficulty breathing. °· You have chest pain. °· You have any allergic problems. °  °This information is not intended to replace advice given to you by your health care provider. Make sure you discuss any questions you have with your health care provider. °  °Document Released: 05/28/2000 Document Revised: 03/12/2014 Document Reviewed: 06/20/2011 °Elsevier Interactive Patient Education ©2016 [employer] ° °

## 2015-11-24 ENCOUNTER — Ambulatory Visit: Payer: BLUE CROSS/BLUE SHIELD | Admitting: Anesthesiology

## 2015-11-24 ENCOUNTER — Telehealth: Payer: Self-pay | Admitting: Cardiovascular Disease

## 2015-11-24 ENCOUNTER — Encounter
Admission: RE | Disposition: A | Discharge status: Home / Self Care | Payer: Self-pay | Source: Ambulatory Visit | Attending: Gastroenterology

## 2015-11-24 ENCOUNTER — Ambulatory Visit
Admission: RE | Admit: 2015-11-24 | Discharge: 2015-11-24 | Disposition: A | Discharge status: Home / Self Care | Payer: BLUE CROSS/BLUE SHIELD | Source: Ambulatory Visit | Attending: Gastroenterology | Admitting: Gastroenterology

## 2015-11-24 ENCOUNTER — Encounter: Payer: Self-pay | Admitting: Anesthesiology

## 2015-11-24 DIAGNOSIS — Z8249 Family history of ischemic heart disease and other diseases of the circulatory system: Secondary | ICD-10-CM | POA: Insufficient documentation

## 2015-11-24 DIAGNOSIS — N189 Chronic kidney disease, unspecified: Secondary | ICD-10-CM | POA: Insufficient documentation

## 2015-11-24 DIAGNOSIS — Z951 Presence of aortocoronary bypass graft: Secondary | ICD-10-CM | POA: Insufficient documentation

## 2015-11-24 DIAGNOSIS — E039 Hypothyroidism, unspecified: Secondary | ICD-10-CM | POA: Insufficient documentation

## 2015-11-24 DIAGNOSIS — Z9889 Other specified postprocedural states: Secondary | ICD-10-CM | POA: Insufficient documentation

## 2015-11-24 DIAGNOSIS — K2950 Unspecified chronic gastritis without bleeding: Secondary | ICD-10-CM | POA: Insufficient documentation

## 2015-11-24 DIAGNOSIS — Z801 Family history of malignant neoplasm of trachea, bronchus and lung: Secondary | ICD-10-CM | POA: Insufficient documentation

## 2015-11-24 DIAGNOSIS — Z9842 Cataract extraction status, left eye: Secondary | ICD-10-CM | POA: Insufficient documentation

## 2015-11-24 DIAGNOSIS — Z833 Family history of diabetes mellitus: Secondary | ICD-10-CM | POA: Insufficient documentation

## 2015-11-24 DIAGNOSIS — I2510 Atherosclerotic heart disease of native coronary artery without angina pectoris: Secondary | ICD-10-CM | POA: Insufficient documentation

## 2015-11-24 DIAGNOSIS — Z6836 Body mass index (BMI) 36.0-36.9, adult: Secondary | ICD-10-CM | POA: Insufficient documentation

## 2015-11-24 DIAGNOSIS — R110 Nausea: Secondary | ICD-10-CM

## 2015-11-24 DIAGNOSIS — E1122 Type 2 diabetes mellitus with diabetic chronic kidney disease: Secondary | ICD-10-CM | POA: Insufficient documentation

## 2015-11-24 DIAGNOSIS — E785 Hyperlipidemia, unspecified: Secondary | ICD-10-CM | POA: Insufficient documentation

## 2015-11-24 DIAGNOSIS — I252 Old myocardial infarction: Secondary | ICD-10-CM | POA: Insufficient documentation

## 2015-11-24 DIAGNOSIS — Z7982 Long term (current) use of aspirin: Secondary | ICD-10-CM | POA: Insufficient documentation

## 2015-11-24 DIAGNOSIS — Z825 Family history of asthma and other chronic lower respiratory diseases: Secondary | ICD-10-CM | POA: Insufficient documentation

## 2015-11-24 DIAGNOSIS — I272 Other secondary pulmonary hypertension: Secondary | ICD-10-CM | POA: Insufficient documentation

## 2015-11-24 DIAGNOSIS — I130 Hypertensive heart and chronic kidney disease with heart failure and stage 1 through stage 4 chronic kidney disease, or unspecified chronic kidney disease: Secondary | ICD-10-CM | POA: Insufficient documentation

## 2015-11-24 DIAGNOSIS — E1140 Type 2 diabetes mellitus with diabetic neuropathy, unspecified: Secondary | ICD-10-CM | POA: Insufficient documentation

## 2015-11-24 DIAGNOSIS — I255 Ischemic cardiomyopathy: Secondary | ICD-10-CM | POA: Insufficient documentation

## 2015-11-24 DIAGNOSIS — Z79899 Other long term (current) drug therapy: Secondary | ICD-10-CM | POA: Insufficient documentation

## 2015-11-24 DIAGNOSIS — I5022 Chronic systolic (congestive) heart failure: Secondary | ICD-10-CM | POA: Insufficient documentation

## 2015-11-24 DIAGNOSIS — Z9841 Cataract extraction status, right eye: Secondary | ICD-10-CM | POA: Insufficient documentation

## 2015-11-24 HISTORY — DX: Presence of dental prosthetic device (complete) (partial): Z97.2

## 2015-11-24 HISTORY — PX: ESOPHAGOGASTRODUODENOSCOPY (EGD) WITH PROPOFOL: SHX5813

## 2015-11-24 LAB — GLUCOSE, CAPILLARY
Glucose-Capillary: 126 mg/dL — ABNORMAL HIGH (ref 65–99)
Glucose-Capillary: 127 mg/dL — ABNORMAL HIGH (ref 65–99)

## 2015-11-24 SURGERY — ESOPHAGOGASTRODUODENOSCOPY (EGD) WITH PROPOFOL
Anesthesia: Monitor Anesthesia Care | Wound class: Clean Contaminated

## 2015-11-24 MED ORDER — PROPOFOL 10 MG/ML IV BOLUS
INTRAVENOUS | Status: DC | PRN
  Administered 2015-11-24: 30 mg via INTRAVENOUS
  Administered 2015-11-24: 10 mg via INTRAVENOUS
  Administered 2015-11-24: 30 mg via INTRAVENOUS
  Administered 2015-11-24 (×2): 10 mg via INTRAVENOUS
  Administered 2015-11-24: 30 mg via INTRAVENOUS
  Administered 2015-11-24: 50 mg via INTRAVENOUS
  Administered 2015-11-24 (×4): 20 mg via INTRAVENOUS
  Administered 2015-11-24 (×2): 10 mg via INTRAVENOUS
  Administered 2015-11-24: 30 mg via INTRAVENOUS

## 2015-11-24 MED ORDER — LIDOCAINE HCL (CARDIAC) 20 MG/ML IV SOLN
INTRAVENOUS | Status: DC | PRN
  Administered 2015-11-24: 50 mg via INTRAVENOUS

## 2015-11-24 MED ORDER — LABETALOL HCL 5 MG/ML IV SOLN
INTRAVENOUS | Status: DC | PRN
  Administered 2015-11-24: 5 mg via INTRAVENOUS
  Administered 2015-11-24: 10 mg via INTRAVENOUS

## 2015-11-24 MED ORDER — LACTATED RINGERS IV SOLN
INTRAVENOUS | Status: DC
  Administered 2015-11-24: 08:00:00 via INTRAVENOUS

## 2015-11-24 MED ORDER — GLYCOPYRROLATE 0.2 MG/ML IJ SOLN
INTRAMUSCULAR | Status: DC | PRN
  Administered 2015-11-24: 0.2 mg via INTRAVENOUS

## 2015-11-24 SURGICAL SUPPLY — 32 items
BALLN DILATOR 10-12 8 (BALLOONS)
BALLN DILATOR 12-15 8 (BALLOONS)
BALLN DILATOR 15-18 8 (BALLOONS)
BALLN DILATOR CRE 0-12 8 (BALLOONS)
BALLN DILATOR ESOPH 8 10 CRE (MISCELLANEOUS) IMPLANT
BALLOON DILATOR 12-15 8 (BALLOONS) IMPLANT
BALLOON DILATOR 15-18 8 (BALLOONS) IMPLANT
BALLOON DILATOR CRE 0-12 8 (BALLOONS) IMPLANT
BLOCK BITE 60FR ADLT L/F GRN (MISCELLANEOUS) ×3 IMPLANT
CANISTER SUCT 1200ML W/VALVE (MISCELLANEOUS) ×3 IMPLANT
CLIP HMST 235XBRD CATH ROT (MISCELLANEOUS) IMPLANT
CLIP RESOLUTION 360 11X235 (MISCELLANEOUS)
FCP ESCP3.2XJMB 240X2.8X (MISCELLANEOUS)
FORCEPS BIOP RAD 4 LRG CAP 4 (CUTTING FORCEPS) ×3 IMPLANT
FORCEPS BIOP RJ4 240 W/NDL (MISCELLANEOUS)
FORCEPS ESCP3.2XJMB 240X2.8X (MISCELLANEOUS) IMPLANT
GOWN CVR UNV OPN BCK APRN NK (MISCELLANEOUS) ×2 IMPLANT
GOWN ISOL THUMB LOOP REG UNIV (MISCELLANEOUS) ×4
INJECTOR VARIJECT VIN23 (MISCELLANEOUS) IMPLANT
KIT DEFENDO VALVE AND CONN (KITS) IMPLANT
KIT ENDO PROCEDURE OLY (KITS) ×3 IMPLANT
MARKER SPOT ENDO TATTOO 5ML (MISCELLANEOUS) IMPLANT
PAD GROUND ADULT SPLIT (MISCELLANEOUS) IMPLANT
RETRIEVER NET PLAT FOOD (MISCELLANEOUS) IMPLANT
SNARE SHORT THROW 13M SML OVAL (MISCELLANEOUS) IMPLANT
SNARE SHORT THROW 30M LRG OVAL (MISCELLANEOUS) IMPLANT
SPOT EX ENDOSCOPIC TATTOO (MISCELLANEOUS)
SYR INFLATION 60ML (SYRINGE) IMPLANT
TRAP ETRAP POLY (MISCELLANEOUS) IMPLANT
VARIJECT INJECTOR VIN23 (MISCELLANEOUS)
WATER STERILE IRR 250ML POUR (IV SOLUTION) ×3 IMPLANT
WIRE CRE 18-20MM 8CM F G (MISCELLANEOUS) IMPLANT

## 2015-11-24 NOTE — H&P (Signed)
[name], MD [facility] [address], [place], [place] [address] Phone: [phone] Fax : [phone]  Primary Care Physician:  [name], MD Primary Gastroenterologist:  Dr. Allen Norris  Pre-Procedure History & Physical: HPI:  Jamie Arias is a 60 y.o. female is here for an endoscopy.   Past Medical History:  Diagnosis Date  . Anemia   . CHF (congestive heart failure) ([place])   . Chronic kidney disease    EFGR 31  . Chronic systolic heart failure ([place])    a. Due to ischemic cardiomyopathy. EF as low as 35%, improved to normal s/p CABG; b. echo 07/06/13: EF 55-60%, no RWMAs, mod TR, trivial pericardial effusion not c/w tamponade physiology  . Coronary artery disease    a. NSTEMI 06/2013; b.cath: severe three-vessel CAD w/ EF 30% & mild-mod MR; c. s/p 3 vessel CABG 07/02/13 (LIMA-LAD, SVG-OM, and SVG-RPDA).  . Diabetes mellitus without complication ([place])   . Diabetic neuropathy ([place])   . Hyperlipidemia   . Hypertension   . Hypothyroidism   . Myocardial infarction ([place]) 2015  . Pleural effusion 2015  . Pulmonary hypertension ([place])   . Wears dentures    full lower    Past Surgical History:  Procedure Laterality Date  . CATARACT EXTRACTION Bilateral   . CHOLECYSTECTOMY N/A 12/09/2014   Procedure: LAPAROSCOPIC CHOLECYSTECTOMY;  Surgeon: Marlyce [name], MD;  Location: [facility];  Service: General;  Laterality: N/A;  . CORONARY ARTERY BYPASS GRAFT N/A 07/02/2013   Procedure: CORONARY ARTERY BYPASS GRAFTING (CABG);  Surgeon: Ivin [name], MD;  Location: [facility];  Service: Open Heart Surgery;  Laterality: N/A;  CABG x three, using left internal mammary artery and right leg greater saphenous vein harvested endoscopically  . INTRAOPERATIVE TRANSESOPHAGEAL ECHOCARDIOGRAM N/A 07/02/2013   Procedure: INTRAOPERATIVE TRANSESOPHAGEAL ECHOCARDIOGRAM;  Surgeon: Ivin [name], MD;  Location: [place];  Service: Open Heart Surgery;  Laterality: N/A;  . THORACENTESIS Left 2015    Prior  to Admission medications   Medication Sig Start Date End Date Taking? Authorizing Provider  amLODipine (NORVASC) 5 MG tablet Take 0.5 tablets (2.5 mg total) by mouth daily. 09/02/15  Yes [name], MD  aspirin EC 81 MG tablet Take 81 mg by mouth daily.   Yes Historical Provider, MD  atorvastatin (LIPITOR) 80 MG tablet TAKE ONE TABLET BY MOUTH ONCE DAILY Patient taking differently: TAKE ONE TABLET BY MOUTH ONCE DAILY IN THE AFTERNOON 01/26/15  Yes Wellington Hampshire, MD  blood glucose meter kit and supplies KIT Dispense based on patient and insurance preference. Use up to twice daily. (FOR ICD-10 E11.9) 07/01/15  Yes Rubbie Battiest, NP  carvedilol (COREG) 6.25 MG tablet TAKE ONE TABLET BY MOUTH TWICE DAILY 01/03/15  Yes Wellington Hampshire, MD  furosemide (LASIX) 20 MG tablet  09/09/15  Yes Historical Provider, MD  glucose blood test strip Test 4 times daily Dx. E11.9. One Touch ultra strips 04/19/14  Yes Rubbie Battiest, NP  hydrALAZINE (APRESOLINE) 25 MG tablet Take 1 tablet (25 mg total) by mouth 3 (three) times daily. 09/15/15  Yes Wellington Hampshire, MD  insulin aspart protamine - aspart (NOVOLOG 70/30 MIX) (70-30) 100 UNIT/ML FlexPen Inject 0.08 mLs (8 Units total) into the skin 2 (two) times daily. 06/20/15  Yes Rubbie Battiest, NP  levothyroxine (SYNTHROID, LEVOTHROID) 75 MCG tablet Take 1 tablet (75 mcg total) by mouth daily. 07/01/15  Yes Rubbie Battiest, NP  metoCLOPramide (REGLAN) 5 MG tablet Take 5 mg by  mouth every 8 (eight) hours as needed for nausea.   Yes Historical Provider, MD  omeprazole (PRILOSEC) 40 MG capsule Take 1 capsule (40 mg total) by mouth 2 (two) times daily before a meal. 10/26/15  Yes Srikar Sudini, MD  ondansetron (ZOFRAN) 4 MG tablet  08/31/15  Yes Historical Provider, MD  promethazine (PHENERGAN) 12.5 MG tablet Take 12.5 mg by mouth every 6 (six) hours as needed for nausea or vomiting.   Yes Historical Provider, MD  traZODone (DESYREL) 50 MG tablet TAKE ONE TABLET BY MOUTH AT  BEDTIME AS NEEDED FOR SLEEP 10/05/15  Yes [name], MD  NITROSTAT 0.4 MG SL tablet DISSOLVE ONE TABLET UNDER THE TONGUE EVERY 5 MINUTES AS NEEDED FOR CHEST PAIN.  DO NOT EXCEED A TOTAL OF 3 DOSES IN 15 MINUTES 07/06/15   Wellington Hampshire, MD    Allergies as of 10/27/2015  . (No Known Allergies)    Family History  Problem Relation Age of Onset  . COPD Mother   . Cancer Mother     Lung  . Pulmonary embolism Father   . Diabetes Father   . Diabetes Paternal Grandfather   . Heart disease Maternal Grandmother   . Colon cancer Neg Hx   . Colon polyps Neg Hx   . Esophageal cancer Neg Hx   . Pancreatic cancer Neg Hx   . Liver disease Neg Hx     Social History   Social History  . Marital status: Married    Spouse name: N/A  . Number of children: N/A  . Years of education: N/A   Occupational History  . works for [employer] Top[employer]  . Smoking status: Never Smoker  . Smokeless tobacco: Never Used  . Alcohol use No  . Drug use: No  . Sexual activity: Not on file   Other Topics Concern  . Not on file   Social History Narrative   Patient lives at home with her husband. Patient has 4 adult children.    Review of Systems: See HPI, otherwise negative ROS  Physical Exam: BP (!) 214/90   Pulse 73   Temp 97.2 F (36.2 C) (Temporal)   Resp 16   Ht '5\' 4"'$  (1.626 m)   Wt 214 lb (97.1 kg)   SpO2 92%   BMI 36.73 kg/m  General:   Alert,  pleasant and cooperative in NAD Head:  Normocephalic and atraumatic. Neck:  Supple; no masses or thyromegaly. Lungs:  Clear throughout to auscultation.    Heart:  Regular rate and rhythm. Abdomen:  Soft, nontender and nondistended. Normal bowel sounds, without guarding, and without rebound.   Neurologic:  Alert and  oriented x4;  grossly normal neurologically.  Impression/Plan: Jamie Arias is here for an endoscopy to be performed for nausea  Risks, benefits, limitations, and alternatives regarding   endoscopy have been reviewed with the patient.  Questions have been answered.  All parties agreeable.   [name], MD  11/24/2015, 8:23 AM

## 2015-11-24 NOTE — Telephone Encounter (Signed)
Pt spouse, [name],  Reports pt has had bilateral arm edema x 1 week.  She had EGD today and states nurses had difficulty placing IV d/t fluid retention in her arms.  Denies any other sx. No SOB or leg edema.  Pt follows low sodium diet. Denies any dietary changes except that pt has had lack of appetite d/t stomach issues. States pt was told at EGD procedure today that nothing was wrong w/stomach and it could be r/t her kidneys. She takes lasix 20mg  qd which was a decrease from 40mg  d/t kidney disease.   Advised spouse to have pt strictly follow low sodium diet, continue all meds and elevate arms on pillows. He verbalized understanding and is agreeable w/plan.  Pt has appt in October and spouse asks if she can be seen sooner. Transferred to scheduling.

## 2015-11-24 NOTE — Anesthesia Postprocedure Evaluation (Signed)
Anesthesia Post Note  Patient: Jamie Arias  Procedure(s) Performed: Procedure(s) (LRB): ESOPHAGOGASTRODUODENOSCOPY (EGD) WITH PROPOFOL (N/A)  Patient location during evaluation: PACU Anesthesia Type: MAC Level of consciousness: awake and alert Pain management: pain level controlled Vital Signs Assessment: post-procedure vital signs reviewed and stable Respiratory status: spontaneous breathing, nonlabored ventilation, respiratory function stable and patient connected to nasal cannula oxygen Cardiovascular status: stable and blood pressure returned to baseline Anesthetic complications: no Comments: Pt to f/u with PCP regarding BP control.    Fidel Levy

## 2015-11-24 NOTE — Op Note (Signed)
[facility]astroenter[facility] Patient Name: Jamie Arias Procedure Date: 11/24/2015 8:20 AM MRN: [id number] Account #: [id number] Date of Birth: 1955/08/24 Admit Type: Outpatient Age: 60 Room: [facility] OR ROOM 01 Gender: Female Note Status: Finalized Procedure:            Upper GI endoscopy Indications:          Nausea Providers:            [name] MD, MD Referring MD:         Randall Hiss g. Caryl Bis (Referring MD) Medicines:            Propofol per Anesthesia Complications:        No immediate complications. Procedure:            Pre-Anesthesia Assessment:                       - Prior to the procedure, a History and Physical was                        performed, and patient medications and allergies were                        reviewed. The patient's tolerance of previous                        anesthesia was also reviewed. The risks and benefits of                        the procedure and the sedation options and risks were                        discussed with the patient. All questions were                        answered, and informed consent was obtained. Prior                        Anticoagulants: The patient has taken no previous                        anticoagulant or antiplatelet agents. ASA Grade                        Assessment: II - A patient with mild systemic disease.                        After reviewing the risks and benefits, the patient was                        deemed in satisfactory condition to undergo the                        procedure.                       After obtaining informed consent, the endoscope was                        passed under direct vision. Throughout the procedure,  the patient's blood pressure, pulse, and oxygen                        saturations were monitored continuously. The Olympus                        GIF H180J colonscope (N#:[id number]) was introduced                        through the  mouth, and advanced to the second part of                        duodenum. The upper GI endoscopy was accomplished                        without difficulty. The patient tolerated the procedure                        well. Findings:      The esophagus was normal.      The stomach was normal.      The examined duodenum was normal.      Biopsies were taken with a cold forceps in the gastric antrum for       Helicobacter pylori testing. Impression:           - Normal esophagus.                       - Normal stomach.                       - Normal examined duodenum.                       - Biopsies were taken with a cold forceps for                        Helicobacter pylori testing. Recommendation:       - Await pathology results. Procedure Code(s):    --- Professional ---                       [phone], Esophagogastroduodenoscopy, flexible, transoral;                        with biopsy, single or multiple Diagnosis Code(s):    --- Professional ---                       R11.0, Nausea CPT copyright 2016 American Medical Association. All rights reserved. The codes documented in this report are preliminary and upon coder review may  be revised to meet current compliance requirements. [name] MD, MD 11/24/2015 8:57:42 AM This report has been signed electronically. Number of Addenda: 0 Note Initiated On: 11/24/2015 8:20 AM Total Procedure Duration: 0 hours 2 minutes 57 seconds       [facility]

## 2015-11-24 NOTE — Telephone Encounter (Signed)
Pt c/o swelling: STAT is pt has developed SOB within 24 hours  1. How long have you been experiencing swelling? About a week   2. Where is the swelling located? Both arms  3.  Are you currently taking a "fluid pill"?yes   4.  Are you currently SOB? no  5.  Have you traveled recently? No

## 2015-11-24 NOTE — Anesthesia Preprocedure Evaluation (Addendum)
Anesthesia Evaluation  Patient identified by MRN, date of birth, ID band  Reviewed: NPO status   History of Anesthesia Complications Negative for: history of anesthetic complications  Airway Mallampati: II  TM Distance: >3 FB Neck ROM: full    Dental  (+) Upper Dentures, Lower Dentures, Missing   Pulmonary neg pulmonary ROS,    Pulmonary exam normal        Cardiovascular Exercise Tolerance: Good hypertension, + CAD, + Past MI (2015), + CABG (2015) and +CHF (history)  negative cardio ROS Normal cardiovascular exam   ekg: nsr;  echo: 10/2015: The right ventricular systolic pressure was increased consistent   with moderate pulmonary hypertension. Normal LVEF and mild   diastolic dysfunction and normal wall motions. ef=65%;   cards stable: 10/2015: dr. Fletcher Anon;  stress: 10/2015:   There was no ST segment deviation noted during stress.  No T wave inversion was noted during stress.  The study is normal.  This is a low risk study.  The left ventricular ejection fraction is normal (55-65%).   Neuro/Psych negative neurological ROS  negative psych ROS   GI/Hepatic negative GI ROS, Neg liver ROS,   Endo/Other  diabetesHypothyroidism Morbid obesity (bmi=37;)  Renal/GU CRFRenal disease (ckd3; Cr increased;)negative Renal ROS  negative genitourinary   Musculoskeletal   Abdominal   Peds  Hematology  (+) anemia , Hgb=8.4;   Anesthesia Other Findings   Reproductive/Obstetrics                           Anesthesia Physical Anesthesia Plan  ASA: III  Anesthesia Plan: MAC   Post-op Pain Management:    Induction:   Airway Management Planned:   Additional Equipment:   Intra-op Plan:   Post-operative Plan:   Informed Consent: I have reviewed the patients History and Physical, chart, labs and discussed the procedure including the risks, benefits and alternatives for the proposed anesthesia  with the patient or authorized representative who has indicated his/her understanding and acceptance.     Plan Discussed with: CRNA  Anesthesia Plan Comments:        Anesthesia Quick Evaluation

## 2015-11-24 NOTE — Transfer of Care (Signed)
Immediate Anesthesia Transfer of Care Note  Patient: Jamie Arias  Procedure(s) Performed: Procedure(s) with comments: ESOPHAGOGASTRODUODENOSCOPY (EGD) WITH PROPOFOL (N/A) - Diabetic - insulin  Patient Location: PACU  Anesthesia Type: MAC  Level of Consciousness: awake, alert  and patient cooperative  Airway and Oxygen Therapy: Patient Spontanous Breathing and Patient connected to supplemental oxygen  Post-op Assessment: Post-op Vital signs reviewed, Patient's Cardiovascular Status Stable, Respiratory Function Stable, Patent Airway and No signs of Nausea or vomiting  Post-op Vital Signs: Reviewed and stable  Complications: No apparent anesthesia complications

## 2015-11-25 ENCOUNTER — Encounter: Payer: Self-pay | Admitting: Gastroenterology

## 2015-11-29 ENCOUNTER — Encounter: Payer: Self-pay | Admitting: Gastroenterology

## 2015-11-29 ENCOUNTER — Other Ambulatory Visit: Payer: Self-pay

## 2015-11-29 ENCOUNTER — Ambulatory Visit (INDEPENDENT_AMBULATORY_CARE_PROVIDER_SITE_OTHER): Payer: BLUE CROSS/BLUE SHIELD | Admitting: Gastroenterology

## 2015-11-29 VITALS — BP 182/70 | HR 75 | Temp 98.4°F | Ht 64.0 in | Wt 216.4 lb

## 2015-11-29 DIAGNOSIS — R110 Nausea: Secondary | ICD-10-CM | POA: Diagnosis not present

## 2015-11-29 NOTE — Progress Notes (Signed)
Primary Care Physician: [name], MD  Primary Gastroenterologist:  Dr. [name]  Chief Complaint  Patient presents with  . Anemia    HPI: Jamie Arias is a 60 y.o. female here For follow-up after having an upper endoscopy last week. The patient's upper endoscopy had biopsies taken of the stomach for possible H. pylori. The patient's EGD was negative. The patient now reports that she continues to have nausea. The patient was told that since her EGD was negative the patient should strongly consider that her renal failure is causing her to have the nausea. The patient states that she saw her nephrologist today and she is also seeing a vascular surgeon on Thursday for a dialysis catheter to be placed. She has also been told that if her nausea persists after dialysis then further investigations may be needed.  Current Outpatient Prescriptions  Medication Sig Dispense Refill  . amLODipine (NORVASC) 5 MG tablet Take 0.5 tablets (2.5 mg total) by mouth daily. 45 tablet 3  . aspirin EC 81 MG tablet Take 81 mg by mouth daily.    Marland Kitchen atorvastatin (LIPITOR) 80 MG tablet TAKE ONE TABLET BY MOUTH ONCE DAILY (Patient taking differently: TAKE ONE TABLET BY MOUTH ONCE DAILY IN THE AFTERNOON) 90 tablet 3  . blood glucose meter kit and supplies KIT Dispense based on patient and insurance preference. Use up to twice daily. (FOR ICD-10 E11.9) 1 each 0  . carvedilol (COREG) 6.25 MG tablet TAKE ONE TABLET BY MOUTH TWICE DAILY 180 tablet 3  . furosemide (LASIX) 20 MG tablet   0  . gabapentin (NEURONTIN) 300 MG capsule   0  . glucose blood test strip Test 4 times daily Dx. E11.9. One Touch ultra strips 100 each 12  . hydrALAZINE (APRESOLINE) 25 MG tablet Take 1 tablet (25 mg total) by mouth 3 (three) times daily. 90 tablet 2  . insulin aspart protamine - aspart (NOVOLOG 70/30 MIX) (70-30) 100 UNIT/ML FlexPen Inject 0.08 mLs (8 Units total) into the skin 2 (two) times daily. 15 mL 11  . isosorbide  mononitrate (IMDUR) 30 MG 24 hr tablet   0  . levothyroxine (SYNTHROID, LEVOTHROID) 75 MCG tablet Take 1 tablet (75 mcg total) by mouth daily. 90 tablet 1  . lisinopril (PRINIVIL,ZESTRIL) 5 MG tablet   1  . metoCLOPramide (REGLAN) 5 MG tablet Take 5 mg by mouth every 8 (eight) hours as needed for nausea.    Marland Kitchen NITROSTAT 0.4 MG SL tablet DISSOLVE ONE TABLET UNDER THE TONGUE EVERY 5 MINUTES AS NEEDED FOR CHEST PAIN.  DO NOT EXCEED A TOTAL OF 3 DOSES IN 15 MINUTES 25 tablet 1  . omeprazole (PRILOSEC) 40 MG capsule Take 1 capsule (40 mg total) by mouth 2 (two) times daily before a meal. 60 capsule 0  . ondansetron (ZOFRAN) 4 MG tablet   0  . promethazine (PHENERGAN) 12.5 MG tablet Take 12.5 mg by mouth every 6 (six) hours as needed for nausea or vomiting.    . traZODone (DESYREL) 50 MG tablet TAKE ONE TABLET BY MOUTH AT BEDTIME AS NEEDED FOR SLEEP 30 tablet 3   No current facility-administered medications for this visit.     Allergies as of 11/29/2015  . (No Known Allergies)    ROS:  General: Negative for anorexia, weight loss, fever, chills, fatigue, weakness. ENT: Negative for hoarseness, difficulty swallowing , nasal congestion. CV: Negative for chest pain, angina, palpitations, dyspnea on exertion, peripheral edema.  Respiratory: Negative for dyspnea at rest,  dyspnea on exertion, cough, sputum, wheezing.  GI: See history of present illness. GU:  Negative for dysuria, hematuria, urinary incontinence, urinary frequency, nocturnal urination.  Endo: Negative for unusual weight change.    Physical Examination:   BP (!) 182/70   Pulse 75   Temp 98.4 F (36.9 C) (Oral)   Ht _0  (1.626 m)   Wt 216 lb 6.4 oz (98.2 kg)   BMI 37.14 kg/m   General: Well-nourished, well-developed in no acute distress.  Eyes: No icterus. Conjunctivae pink. Extremities: No lower extremity edema. No clubbing or deformities. Neuro: Alert and oriented x 3.  Grossly intact. Skin: Warm and dry, no jaundice.     Psych: Alert and cooperative, normal mood and affect.  Labs:    Imaging Studies: No results found.  Assessment and Plan:   Jamie Arias is a 60 y.o. y/o female who had an upper endoscopy last week for nausea. The upper endoscopy was negative but biopsies were taken. The biopsies are not back at and the patient will be contacted with biopsy results. The patient has been told that I strongly believe that her uremia is likely the cause of her nausea. It is also likely contributing to her anemia. The patient will follow-up with her nephrologist and start dialysis when the nephrologists dictates. She will be contacted with the biopsy results of her stomach when they are in.   Note: This dictation was prepared with Dragon dictation along with smaller phrase technology. Any transcriptional errors that result from this process are unintentional.

## 2015-11-30 ENCOUNTER — Ambulatory Visit: Payer: BLUE CROSS/BLUE SHIELD | Admitting: Family Medicine

## 2015-11-30 ENCOUNTER — Encounter: Payer: Self-pay | Admitting: Gastroenterology

## 2015-12-01 ENCOUNTER — Encounter (INDEPENDENT_AMBULATORY_CARE_PROVIDER_SITE_OTHER): Payer: Self-pay

## 2015-12-05 ENCOUNTER — Encounter: Payer: Self-pay | Admitting: Gastroenterology

## 2015-12-05 ENCOUNTER — Other Ambulatory Visit: Payer: Self-pay | Admitting: Vascular Surgery

## 2015-12-06 ENCOUNTER — Encounter
Admission: RE | Disposition: A | Discharge status: Home / Self Care | Payer: Self-pay | Source: Ambulatory Visit | Attending: Vascular Surgery

## 2015-12-06 ENCOUNTER — Ambulatory Visit
Admission: RE | Admit: 2015-12-06 | Discharge: 2015-12-06 | Disposition: A | Discharge status: Home / Self Care | Payer: BLUE CROSS/BLUE SHIELD | Source: Ambulatory Visit | Attending: Vascular Surgery | Admitting: Vascular Surgery

## 2015-12-06 ENCOUNTER — Telehealth (INDEPENDENT_AMBULATORY_CARE_PROVIDER_SITE_OTHER): Payer: Self-pay

## 2015-12-06 DIAGNOSIS — I132 Hypertensive heart and chronic kidney disease with heart failure and with stage 5 chronic kidney disease, or end stage renal disease: Secondary | ICD-10-CM | POA: Insufficient documentation

## 2015-12-06 DIAGNOSIS — D649 Anemia, unspecified: Secondary | ICD-10-CM | POA: Insufficient documentation

## 2015-12-06 DIAGNOSIS — I5022 Chronic systolic (congestive) heart failure: Secondary | ICD-10-CM | POA: Insufficient documentation

## 2015-12-06 DIAGNOSIS — E1122 Type 2 diabetes mellitus with diabetic chronic kidney disease: Secondary | ICD-10-CM | POA: Insufficient documentation

## 2015-12-06 DIAGNOSIS — N186 End stage renal disease: Secondary | ICD-10-CM

## 2015-12-06 DIAGNOSIS — I252 Old myocardial infarction: Secondary | ICD-10-CM | POA: Insufficient documentation

## 2015-12-06 DIAGNOSIS — E785 Hyperlipidemia, unspecified: Secondary | ICD-10-CM | POA: Insufficient documentation

## 2015-12-06 DIAGNOSIS — Z794 Long term (current) use of insulin: Secondary | ICD-10-CM | POA: Insufficient documentation

## 2015-12-06 DIAGNOSIS — Z79899 Other long term (current) drug therapy: Secondary | ICD-10-CM | POA: Insufficient documentation

## 2015-12-06 HISTORY — PX: PERIPHERAL VASCULAR CATHETERIZATION: SHX172C

## 2015-12-06 SURGERY — DIALYSIS/PERMA CATHETER INSERTION
Anesthesia: Moderate Sedation | Laterality: Right

## 2015-12-06 MED ORDER — MIDAZOLAM HCL 2 MG/2ML IJ SOLN
INTRAMUSCULAR | Status: DC | PRN
  Administered 2015-12-06: 1 mg via INTRAVENOUS
  Administered 2015-12-06: 2 mg via INTRAVENOUS

## 2015-12-06 MED ORDER — MIDAZOLAM HCL 2 MG/2ML IJ SOLN
INTRAMUSCULAR | Status: AC
  Filled 2015-12-06: qty 2

## 2015-12-06 MED ORDER — HEPARIN (PORCINE) IN NACL 2-0.9 UNIT/ML-% IJ SOLN
INTRAMUSCULAR | Status: AC
  Filled 2015-12-06: qty 500

## 2015-12-06 MED ORDER — HYDROMORPHONE HCL 1 MG/ML IJ SOLN: 1.0000 mg | Freq: Once | INTRAMUSCULAR | Status: DC

## 2015-12-06 MED ORDER — SODIUM CHLORIDE 0.9 % IV SOLN
INTRAVENOUS | Status: DC
  Administered 2015-12-06: 1000 mL via INTRAVENOUS

## 2015-12-06 MED ORDER — HEPARIN SODIUM (PORCINE) 10000 UNIT/ML IJ SOLN
INTRAMUSCULAR | Status: AC
  Filled 2015-12-06: qty 1

## 2015-12-06 MED ORDER — FENTANYL CITRATE (PF) 100 MCG/2ML IJ SOLN
INTRAMUSCULAR | Status: DC | PRN
  Administered 2015-12-06 (×2): 50 ug via INTRAVENOUS

## 2015-12-06 MED ORDER — CEFUROXIME SODIUM 1.5 G IJ SOLR: 1.5000 g | INTRAMUSCULAR | Status: DC

## 2015-12-06 MED ORDER — FENTANYL CITRATE (PF) 100 MCG/2ML IJ SOLN
INTRAMUSCULAR | Status: AC
  Filled 2015-12-06: qty 2

## 2015-12-06 MED ORDER — ONDANSETRON HCL 4 MG/2ML IJ SOLN: 4.0000 mg | Freq: Four times a day (QID) | INTRAMUSCULAR | Status: DC | PRN

## 2015-12-06 MED ORDER — LIDOCAINE-EPINEPHRINE (PF) 1 %-1:200000 IJ SOLN
INTRAMUSCULAR | Status: AC
  Filled 2015-12-06: qty 30

## 2015-12-06 SURGICAL SUPPLY — 4 items
CATH PALINDROME RT-P 15FX19CM (CATHETERS) ×3 IMPLANT
DRAPE INCISE IOBAN 66X45 STRL (DRAPES) ×3 IMPLANT
PACK ANGIOGRAPHY (CUSTOM PROCEDURE TRAY) ×3 IMPLANT
TOWEL OR 17X26 4PK STRL BLUE (TOWEL DISPOSABLE) ×3 IMPLANT

## 2015-12-06 NOTE — Op Note (Signed)
[facility] [facility]   OPERATIVE NOTE     PROCEDURE: 1. Insertion right IJ tunneled dialysis catheter placement 2. Catheter placement under ultrasound and fluoroscopic guidance  PRE-OPERATIVE DIAGNOSIS: end-stage renal requiring hemodialysis  POST-OPERATIVE DIAGNOSIS: same as above  SURGEON: Katha Cabal, M.D.  ANESTHESIA: Conscious sedation was administered under my direct supervision. IV Versed plus fentanyl were utilized. Continuous ECG, pulse oximetry and blood pressure was monitored throughout the entire procedure.  Conscious sedation was for a total of 30 minutes.  ESTIMATED BLOOD LOSS: Minimal  FINDING(S): 1.  Tips of the catheter in the right atrium on fluoroscopy 2.  No obvious pneumothorax on fluoroscopy  SPECIMEN(S):  none  INDICATIONS:   Jamie Arias is a 60 y.o. female  presents with end stage renal disease.  Therefore, the patient requires a tunneled dialysis catheter placement.  The patient is informed of  the risks catheter placement include but are not limited to: bleeding, infection, central venous injury, pneumothorax, possible venous stenosis, possible malpositioning in the venous system, and possible infections related to long-term catheter presence.  The patient was aware of these risks and agreed to proceed.  DESCRIPTION: The patient was taken back to Special Procedure suite.  Prior to sedation, the patient was given IV antibiotics.  After obtaining adequate sedation, the patient was prepped and draped in the standard fashion for a chest or neck tunneled dialysis catheter placement.  Appropriate Time Out is called.   The the neck and chest wall are then infiltrated with 1% Lidocaine with epinepherine.  A 19 cm tip to cuff palindrome catheter is then selected, opened on the back table and prepped. Under ultrasound guidance, the right internal jugular vein was cannulated with the Seldinger needle.  A J-wire was then advanced under  fluoroscopic guidance into the inferior vena cava and the wire was secured.  Small counter incision was then made at the wire insertion site. A small pocket was fashioned with blunt dissection to allow easier passage of the cuff.  The dilator and peel-away sheath are then advanced over the wire under fluoroscopic guidance. The catheters and advanced through the peel-away sheath after removal of the wire. It is approximated to the chest wall after verifying the tips at the atrial caval junction and an exit site is selected.  Small incision is made at the selected exit site and the tunneling device was passed subcutaneously to the neck counter incision. Catheter is then pulled through the subcutaneous tunnel. The catheter is then verified for tip position under fluoroscopy, transected and the hub assembly connected.    Each port was tested by aspirating and flushing.  No resistance was noted.  Each port was then thoroughly flushed with heparinized saline.  The catheter was secured in placed with two interrupted stitches of 0 silk tied to the catheter.  The neck incision was closed with a U-stitch of 4-0 Monocryl.  The neck and chest incision were cleaned and sterile bandages applied including a Biopatch.  Each port was then packed with concentrated heparin (1000 Units/mL) at the manufacturer recommended volumes to each port.  Sterile caps were applied to each port.  On completion fluoroscopy, the tips of the catheter were in the right atrium, and there was no evidence of pneumothorax.  COMPLICATIONS: None  CONDITION: Good   Katha Cabal M.D. [place] [facility] Office: [phone]   12/06/2015, 2:25 PM

## 2015-12-06 NOTE — H&P (Signed)
[facility] History & Physical Update  The patient was interviewed and re-examined.  The patient's previous History and Physical has been reviewed and is unchanged.  There is no change in the plan of care. We plan to proceed with the scheduled procedure.  Hortencia Pilar, MD  12/06/2015, 1:53 PM

## 2015-12-07 ENCOUNTER — Ambulatory Visit: Payer: BLUE CROSS/BLUE SHIELD | Admitting: Physician Assistant

## 2015-12-07 ENCOUNTER — Encounter: Payer: Self-pay | Admitting: Vascular Surgery

## 2015-12-07 NOTE — Telephone Encounter (Signed)
Husband called about insurance benefits.

## 2015-12-08 ENCOUNTER — Other Ambulatory Visit: Payer: Self-pay

## 2015-12-08 ENCOUNTER — Telehealth: Payer: Self-pay | Admitting: Cardiovascular Disease

## 2015-12-08 NOTE — Telephone Encounter (Signed)
Received records request [employer], forwarded to [facility] for processing.

## 2015-12-09 ENCOUNTER — Other Ambulatory Visit: Payer: Self-pay | Admitting: Family Medicine

## 2015-12-10 ENCOUNTER — Other Ambulatory Visit: Payer: Self-pay | Admitting: Family Medicine

## 2015-12-12 ENCOUNTER — Ambulatory Visit: Payer: BLUE CROSS/BLUE SHIELD | Admitting: Cardiovascular Disease

## 2015-12-12 ENCOUNTER — Other Ambulatory Visit: Payer: Self-pay

## 2015-12-12 MED ORDER — HYDRALAZINE HCL 25 MG PO TABS: 25.0000 mg | ORAL_TABLET | Freq: Three times a day (TID) | ORAL | 2 refills | Status: DC

## 2015-12-12 NOTE — Telephone Encounter (Signed)
Refill sent to pharmacy. Please get patient set up for follow-up.

## 2015-12-12 NOTE — Telephone Encounter (Signed)
Last filled 11/06/15. Last seen 09/02/15. No follow up visit on file. Patient has cancelled last several appointments.

## 2015-12-12 NOTE — Telephone Encounter (Signed)
Can we refill this? 

## 2015-12-13 ENCOUNTER — Telehealth: Payer: Self-pay | Admitting: *Deleted

## 2015-12-13 MED ORDER — INSULIN ASPART PROT & ASPART (70-30 MIX) 100 UNIT/ML ~~LOC~~ SUSP: 8.0000 [IU] | Freq: Two times a day (BID) | SUBCUTANEOUS | 1 refills | Status: DC

## 2015-12-13 MED ORDER — "INSULIN SYRINGE/NEEDLE 27G X 1/2"" 0.5 ML MISC": 1 refills | Status: DC

## 2015-12-13 NOTE — Telephone Encounter (Signed)
Is there a cheaper insulin?

## 2015-12-13 NOTE — Telephone Encounter (Signed)
We may be able to get her insulin at a cheaper price by switching to vials from Flex pen. Please see if she is willing to change the method of delivery from the Flex pen to vial and injection.

## 2015-12-13 NOTE — Telephone Encounter (Signed)
I have sent in vitals, syringes, and needles. Patient will need to see how expensive this is. If this is still too expensive we may need to try a completely different kind of insulin.

## 2015-12-13 NOTE — Telephone Encounter (Signed)
Notified patient of message. She is fine with switching to vials until her insurance is fixed.

## 2015-12-13 NOTE — Telephone Encounter (Signed)
Pt husband requested to have a Rx for cheaper insulin,due to pt's insurance being cancelled . Her insurance will reinstate, however she's not sure when that will be.  Please contact [name] [phone]

## 2015-12-14 ENCOUNTER — Other Ambulatory Visit: Payer: Self-pay | Admitting: Cardiovascular Disease

## 2015-12-14 MED ORDER — BASAGLAR KWIKPEN 100 UNIT/ML ~~LOC~~ SOPN: 10.0000 [IU] | PEN_INJECTOR | Freq: Every day | SUBCUTANEOUS | 0 refills | Status: DC

## 2015-12-14 NOTE — Telephone Encounter (Signed)
Her husband is going to come pick up sample today

## 2015-12-14 NOTE — Telephone Encounter (Signed)
Patients husband came by to pick up sample of insulin. Instructions went over with him.

## 2015-12-14 NOTE — Telephone Encounter (Signed)
Spoke with patients husband and the insulin that was sent in is still to expensive. Can you please send something else in? Patient is out of her insulin.

## 2015-12-14 NOTE — Telephone Encounter (Signed)
Sample information filled out. Sample order placed in Epic.

## 2015-12-14 NOTE — Telephone Encounter (Signed)
Please contact the patient and let he know that we could try basaglar which is a long acting insulin. We have a sample of this in the office that she can come pick up today if she would like. Thanks.

## 2015-12-14 NOTE — Telephone Encounter (Signed)
Pt husband called back regarding the insulin and trying to find out what is going on. Pt is completely out of insulin. Please advise, thank you!  Call pt @ [phone]

## 2015-12-16 ENCOUNTER — Telehealth: Payer: Self-pay | Admitting: Gastroenterology

## 2015-12-16 ENCOUNTER — Other Ambulatory Visit: Payer: Self-pay

## 2015-12-16 DIAGNOSIS — K2100 Gastro-esophageal reflux disease with esophagitis, without bleeding: Secondary | ICD-10-CM

## 2015-12-16 MED ORDER — OMEPRAZOLE 40 MG PO CPDR: 40.0000 mg | DELAYED_RELEASE_CAPSULE | Freq: Two times a day (BID) | ORAL | 11 refills | Status: DC

## 2015-12-16 NOTE — Telephone Encounter (Signed)
Patient called for a refill on Prilosec 2 weeks ago and the pharmacy doesn't have anything yet. She is out. [facility] on [address]

## 2015-12-16 NOTE — Telephone Encounter (Signed)
Spoke with pt's husband and advised him I have resent omeprazole to her pharmacy. Advised him to contact if they do not receive.

## 2015-12-19 ENCOUNTER — Ambulatory Visit (INDEPENDENT_AMBULATORY_CARE_PROVIDER_SITE_OTHER): Payer: Self-pay | Admitting: Vascular Surgery

## 2015-12-19 ENCOUNTER — Encounter (INDEPENDENT_AMBULATORY_CARE_PROVIDER_SITE_OTHER): Payer: Self-pay | Admitting: Vascular Surgery

## 2015-12-19 VITALS — BP 145/74 | HR 65 | Resp 16 | Ht 64.0 in | Wt 205.0 lb

## 2015-12-19 DIAGNOSIS — I25709 Atherosclerosis of coronary artery bypass graft(s), unspecified, with unspecified angina pectoris: Secondary | ICD-10-CM

## 2015-12-19 DIAGNOSIS — I10 Essential (primary) hypertension: Secondary | ICD-10-CM

## 2015-12-19 DIAGNOSIS — N186 End stage renal disease: Secondary | ICD-10-CM

## 2015-12-19 DIAGNOSIS — E1140 Type 2 diabetes mellitus with diabetic neuropathy, unspecified: Secondary | ICD-10-CM

## 2015-12-19 NOTE — Progress Notes (Signed)
MRN : [id number]  Jamie Arias is a 59 y.o. (1955/09/12) female who presents with chief complaint of  Chief Complaint  Patient presents with  . Re-evaluation    2 week follow up  .  History of Present Illness:  The patient is seen for evaluation of dialysis access.  The patient has a history of multiple failed accesses.  There have been accesses in both arms and in the thighs.    Current access is via a catheter which is functioning poorly.  There have been several episodes of catheter infection.  The patient denies amaurosis fugax or recent TIA symptoms. There are no recent neurological changes noted. The patient denies claudication symptoms or rest pain symptoms. The patient denies history of DVT, PE or superficial thrombophlebitis. The patient denies recent episodes of angina or shortness of breath.   Current Outpatient Prescriptions  Medication Sig Dispense Refill  . amLODipine (NORVASC) 5 MG tablet Take 0.5 tablets (2.5 mg total) by mouth daily. 45 tablet 3  . aspirin EC 81 MG tablet Take 81 mg by mouth daily.    Marland Kitchen atorvastatin (LIPITOR) 80 MG tablet TAKE ONE TABLET BY MOUTH ONCE DAILY (Patient taking differently: TAKE ONE TABLET BY MOUTH ONCE DAILY IN THE AFTERNOON) 90 tablet 3  . blood glucose meter kit and supplies KIT Dispense based on patient and insurance preference. Use up to twice daily. (FOR ICD-10 E11.9) 1 each 0  . carvedilol (COREG) 6.25 MG tablet TAKE ONE TABLET BY MOUTH TWICE DAILY 180 tablet 3  . ferrous sulfate 325 (65 FE) MG tablet Take 325 mg by mouth daily with breakfast.    . furosemide (LASIX) 20 MG tablet   0  . gabapentin (NEURONTIN) 300 MG capsule   0  . glucose blood test strip Test 4 times daily Dx. E11.9. One Touch ultra strips 100 each 12  . hydrALAZINE (APRESOLINE) 25 MG tablet Take 1 tablet (25 mg total) by mouth 3 (three) times daily. 90 tablet 2  . Insulin Glargine (BASAGLAR KWIKPEN) 100 UNIT/ML SOPN Inject 0.1 mLs (10 Units total) into the  skin at bedtime. 1 pen 0  . isosorbide mononitrate (IMDUR) 30 MG 24 hr tablet   0  . levothyroxine (SYNTHROID, LEVOTHROID) 75 MCG tablet Take 1 tablet (75 mcg total) by mouth daily. 90 tablet 1  . lisinopril (PRINIVIL,ZESTRIL) 5 MG tablet   1  . metoCLOPramide (REGLAN) 5 MG tablet Take 5 mg by mouth every 8 (eight) hours as needed for nausea.    Marland Kitchen NITROSTAT 0.4 MG SL tablet DISSOLVE ONE TABLET UNDER THE TONGUE EVERY 5 MINUTES AS NEEDED FOR CHEST PAIN.  DO NOT EXCEED A TOTAL OF 3 DOSES IN 15 MINUTES 25 tablet 1  . omeprazole (PRILOSEC) 40 MG capsule Take 1 capsule (40 mg total) by mouth 2 (two) times daily before a meal. 60 capsule 11  . ondansetron (ZOFRAN) 4 MG tablet   0  . ondansetron (ZOFRAN-ODT) 8 MG disintegrating tablet DISSOLVE ONE TABLET IN MOUTH EVERY 8 HOURS AS NEEDED FOR NAUSEA AND VOMITING 10 tablet 0  . promethazine (PHENERGAN) 12.5 MG tablet Take 12.5 mg by mouth every 6 (six) hours as needed for nausea or vomiting.    . sodium bicarbonate 650 MG tablet Take 650 mg by mouth 2 (two) times daily.    . traZODone (DESYREL) 50 MG tablet TAKE ONE TABLET BY MOUTH AT BEDTIME AS NEEDED FOR SLEEP 30 tablet 3  . Vitamin D, Ergocalciferol, (DRISDOL) 50000 units  CAPS capsule Take 50,000 Units by mouth every 7 (seven) days.     No current facility-administered medications for this visit.     Past Medical History:  Diagnosis Date  . Anemia   . CHF (congestive heart failure) ([place])   . Chronic kidney disease    EFGR 31  . Chronic systolic heart failure ([place])    a. Due to ischemic cardiomyopathy. EF as low as 35%, improved to normal s/p CABG; b. echo 07/06/13: EF 55-60%, no RWMAs, mod TR, trivial pericardial effusion not c/w tamponade physiology  . Coronary artery disease    a. NSTEMI 06/2013; b.cath: severe three-vessel CAD w/ EF 30% & mild-mod MR; c. s/p 3 vessel CABG 07/02/13 (LIMA-LAD, SVG-OM, and SVG-RPDA).  . Diabetes mellitus without complication ([place])   . Diabetic neuropathy ([place])   .  Hyperlipidemia   . Hypertension   . Hypothyroidism   . Myocardial infarction 2015  . Pleural effusion 2015  . Pulmonary hypertension   . Wears dentures    full lower    Past Surgical History:  Procedure Laterality Date  . CATARACT EXTRACTION Bilateral   . CHOLECYSTECTOMY N/A 12/09/2014   Procedure: LAPAROSCOPIC CHOLECYSTECTOMY;  Surgeon: Marlyce [name], MD;  Location: [facility];  Service: General;  Laterality: N/A;  . CORONARY ARTERY BYPASS GRAFT N/A 07/02/2013   Procedure: CORONARY ARTERY BYPASS GRAFTING (CABG);  Surgeon: Ivin [name], MD;  Location: [place] [facility];  Service: Open Heart Surgery;  Laterality: N/A;  CABG x three, using left internal mammary artery and right leg greater saphenous vein harvested endoscopically  . ESOPHAGOGASTRODUODENOSCOPY (EGD) WITH PROPOFOL N/A 11/24/2015   Procedure: ESOPHAGOGASTRODUODENOSCOPY (EGD) WITH PROPOFOL;  Surgeon: [name], MD;  Location: [facility];  Service: Endoscopy;  Laterality: N/A;  Diabetic - insulin  . INTRAOPERATIVE TRANSESOPHAGEAL ECHOCARDIOGRAM N/A 07/02/2013   Procedure: INTRAOPERATIVE TRANSESOPHAGEAL ECHOCARDIOGRAM;  Surgeon: Ivin [name], MD;  Location: [facility];  Service: Open Heart Surgery;  Laterality: N/A;  . PERIPHERAL VASCULAR CATHETERIZATION Right 12/06/2015   Procedure: Dialysis/Perma Catheter Insertion;  Surgeon: Katha Cabal, MD;  Location: [facility];  Service: Cardiovascular;  Laterality: Right;  . THORACENTESIS Left 2015    Social History Social History  Substance Use Topics  . Smoking status: Never Smoker  . Smokeless tobacco: Never Used  . Alcohol use No    Family History Family History  Problem Relation Age of Onset  . COPD Mother   . Cancer Mother     Lung  . Pulmonary embolism Father   . Diabetes Father   . Diabetes Paternal Grandfather   . Heart disease Maternal Grandmother   . Colon cancer Neg Hx   . Colon polyps Neg Hx   . Esophageal cancer Neg Hx   . Pancreatic cancer  Neg Hx   . Liver disease Neg Hx     No Known Allergies   REVIEW OF SYSTEMS (Negative unless checked)  Constitutional: _0 Weight loss  _1 Fever  _2 Chills Cardiac: _3 Chest pain   _4 Chest pressure   _5 Palpitations   _6 Shortness of breath at rest   _7 Shortness of breath with exertion. Vascular:  _8 Pain in legs with walking   _9 Pain in legs at rest    _10 Pain in feet at rest    _11 History of DVT   _12 Phlebitis   _13 Swelling in legs   _14 Varicose veins   _15 Non-healing ulcers Pulmonary:   _16 Uses home oxygen   _17 Productive cough   _18 Hemoptysis   _19 Wheeze  _20 COPD   _21 Asthma Neurologic:  _22 Dizziness  _23   Blackouts   _0 Seizures   _1 History of stroke   _2 History of TIA  _3 Aphasia   _4 Temporary blindness   _5 Dysphagia   _6 Weakness or numbness in arm   _7 Weakness or numbness in leg Musculoskeletal:  _8 Arthritis   _9 Joint swelling   _10 Joint pain   _11 Low back pain Hematologic:  _12 Easy bruising  _13 Easy bleeding   _14 Hypercoagulable state   _15 Anemic   Gastrointestinal:  _16 Blood in stool   _17 Vomiting blood  _18 Gastroesophageal reflux/heartburn   _19 Difficulty swallowing. Genitourinary:  _20 Chronic kidney disease   _21 Difficult urination  _22 Frequent urination  _23 Burning with urination   _24 Blood in urine Skin:  _25 Rashes   _26 Ulcers   Psychological:  _27 History of anxiety   _28  History of major depression.    Physical Examination  Vitals:   12/19/15 1317  BP: (!) 145/74  Pulse: 65  Resp: 16  Weight: 205 lb (93 kg)  Height: _29  (1.626 m)   Body mass index is 35.19 kg/m. Gen:  WD/WN, NAD Head: [place]/AT, No temporalis wasting. Ear/Nose/Throat: Hearing grossly intact, nares w/o erythema or drainage, trachea midline Eyes: PERR, EOM appear normal. Sclera non-icteric Neck: Supple, no nuchal rigidity.  No bruit or JVD.  Pulmonary:  Good air movement, equal and clear to auscultation bilaterally.  Cardiac: RRR, normal S1, S2, no Murmurs, rubs or gallops. Vascular:   Right IJ catheter clean and intact Vessel Right Left    Radial Palpable Palpable  Ulnar Palpable Palpable  Brachial Palpable Palpable  Carotid Palpable Palpable   Gastrointestinal: soft, non-rigid/non-distended. No guarding.  Musculoskeletal: M/S 5/5 throughout.  No deformity or atrophy. Neurologic: CN 2-12 intact. Pain and light touch intact in extremities.  Speech is fluent. Motor exam as listed above. Psychiatric: Judgment intact, Mood & affect appropriate for pt's clinical situation. Dermatologic: No rashes or ulcers noted.  No signs consistent with cellulitis. Lymphatic:  No cervical lymphadenopathy  CBC Lab Results  Component Value Date   WBC 6.8 10/26/2015   HGB 8.4 (L) 10/26/2015   HCT 24.1 (L) 10/26/2015   MCV 89.2 10/26/2015   PLT 153 10/26/2015    BMET    Component Value Date/Time   NA 135 10/26/2015 0402   NA 140 03/30/2014 1611   K 4.0 10/26/2015 0402   K 4.3 03/30/2014 1611   CL 108 10/26/2015 0402   CL 105 03/30/2014 1611   CO2 20 (L) 10/26/2015 0402   CO2 29 03/30/2014 1611   GLUCOSE 123 (H) 10/26/2015 0402   GLUCOSE 187 (H) 03/30/2014 1611   BUN 42 (H) 10/26/2015 0402   BUN 21 (H) 03/30/2014 1611   CREATININE 5.29 (H) 10/26/2015 0402   CREATININE 3.14 (H) 09/09/2015 1534   CALCIUM 7.5 (L) 10/26/2015 0402   CALCIUM 9.0 03/30/2014 1611   GFRNONAA 8 (L) 10/26/2015 0402   GFRNONAA 36 (L) 03/30/2014 1611   GFRNONAA >60 07/23/2013 0741   GFRAA 9 (L) 10/26/2015 0402   GFRAA 44 (L) 03/30/2014 1611   GFRAA >60 07/23/2013 0741   CrCl cannot be calculated (Patient's most recent lab result is older than the maximum 21 days allowed.).  COAG Lab Results  Component Value Date   INR 1.19 07/13/2013   INR 1.27 07/12/2013   INR 1.00 07/11/2013    Radiology No results found.    Assessment/Plan 1. End stage renal disease ([place]) Recommend:  At this time the patient does not have appropriate extremity access for dialysis  Patient should have vein mapping to determine what access could be created.  All  questions were answered.  The patient agrees to proceed with mapping   - VAS [place]; Future  2. Essential hypertension Continue home meds no changes  3. Coronary artery disease involving coronary bypass graft of native heart with angina pectoris (HCC) Nitrates PRN angina  4. Type 2 diabetes mellitus with diabetic neuropathy, without long-term current use of insulin ([place]) Continue oral hypoglycemic    Hortencia Pilar, MD  12/19/2015 1:41 PM    This note was created with Dragon medical transcription system.  Any errors from dictation are purely unintentional

## 2015-12-25 ENCOUNTER — Other Ambulatory Visit: Payer: Self-pay | Admitting: Cardiovascular Disease

## 2016-01-11 ENCOUNTER — Encounter (INDEPENDENT_AMBULATORY_CARE_PROVIDER_SITE_OTHER): Payer: MEDICAID

## 2016-01-11 ENCOUNTER — Ambulatory Visit (INDEPENDENT_AMBULATORY_CARE_PROVIDER_SITE_OTHER): Payer: Self-pay | Admitting: Gastroenterology

## 2016-01-11 ENCOUNTER — Encounter: Payer: Self-pay | Admitting: Gastroenterology

## 2016-01-11 VITALS — BP 167/86 | HR 67 | Temp 97.8°F | Ht 64.0 in | Wt 193.0 lb

## 2016-01-11 DIAGNOSIS — R1013 Epigastric pain: Secondary | ICD-10-CM

## 2016-01-11 DIAGNOSIS — K5900 Constipation, unspecified: Secondary | ICD-10-CM

## 2016-01-11 NOTE — Progress Notes (Signed)
Gastroenterology Consultation  Referring Provider:     [name], MD Primary Care Physician:  [name], MD Primary Gastroenterologist:  Dr. Jonathon Bellows  Reason for Consultation:     Abdominal pain, vomiting         HPI:   Jamie Arias is a 60 y.o. y/o female referred for consultation & management  by Dr. [name], MD.    Jamie Arias is here today to see me . She is a patient of Dr Allen Norris and was last seen in 11/2015 for abdominal pain,nausea. Negative EGD. Dr Allen Norris felt that her nausea was due to renal failure. She was awaiting dialysis. CT scan in 10/2015 showed no obstruction.  Abdominal pain: Onset: last year had her gall bladder resected for different. The present pain began in 07/2015 , since then comes and goes, sometimes comes after a few months. She notices she has pain when she has not had a bowel movement for 4-5 days.  .  Site :points to the epigastrium  Radiation: localized  Severity :7/10 Nature of pain: feels nauseated, squeezing in nature  Aggravating factors: moving makes it worse  Relieving factors : nothing  Weight loss:  Same  NSAID use:  No  PPI use :prilosec, not helped the pain,  Gall bladder surgery: yes Frequency of bowel movements: every 4 days  Change in bowel movements: long stading since she was a little girl  Relief with bowel movements: yes , better when she passes gas. Consistency of a raw banana. She has tried miralax as needed , it did help soften the stool, last took some few months back.  Gas/Bloating/Abdominal distension: no    She feels nauseous when she has an empty stomach , feels better after a bowel movement. Never had a colonoscopy - not keen on it presently .   Past Medical History:  Diagnosis Date  . Anemia   . CHF (congestive heart failure) ([place])   . Chronic kidney disease    EFGR 31  . Chronic systolic heart failure ([place])    a. Due to ischemic cardiomyopathy. EF as low as 35%, improved to normal s/p CABG;  b. echo 07/06/13: EF 55-60%, no RWMAs, mod TR, trivial pericardial effusion not c/w tamponade physiology  . Coronary artery disease    a. NSTEMI 06/2013; b.cath: severe three-vessel CAD w/ EF 30% & mild-mod MR; c. s/p 3 vessel CABG 07/02/13 (LIMA-LAD, SVG-OM, and SVG-RPDA).  . Diabetes mellitus without complication ([place])   . Diabetic neuropathy ([place])   . Hyperlipidemia   . Hypertension   . Hypothyroidism   . Myocardial infarction 2015  . Pleural effusion 2015  . Pulmonary hypertension   . Wears dentures    full lower    Past Surgical History:  Procedure Laterality Date  . CATARACT EXTRACTION Bilateral   . CHOLECYSTECTOMY N/A 12/09/2014   Procedure: LAPAROSCOPIC CHOLECYSTECTOMY;  Surgeon: Marlyce [name], MD;  Location: [facility];  Service: General;  Laterality: N/A;  . CORONARY ARTERY BYPASS GRAFT N/A 07/02/2013   Procedure: CORONARY ARTERY BYPASS GRAFTING (CABG);  Surgeon: Ivin [name], MD;  Location: [facility];  Service: Open Heart Surgery;  Laterality: N/A;  CABG x three, using left internal mammary artery and right leg greater saphenous vein harvested endoscopically  . ESOPHAGOGASTRODUODENOSCOPY (EGD) WITH PROPOFOL N/A 11/24/2015   Procedure: ESOPHAGOGASTRODUODENOSCOPY (EGD) WITH PROPOFOL;  Surgeon: [name], MD;  Location: [facility];  Service: Endoscopy;  Laterality: N/A;  Diabetic - insulin  . INTRAOPERATIVE  TRANSESOPHAGEAL ECHOCARDIOGRAM N/A 07/02/2013   Procedure: INTRAOPERATIVE TRANSESOPHAGEAL ECHOCARDIOGRAM;  Surgeon: Ivin [name], MD;  Location: [facility];  Service: Open Heart Surgery;  Laterality: N/A;  . PERIPHERAL VASCULAR CATHETERIZATION Right 12/06/2015   Procedure: Dialysis/Perma Catheter Insertion;  Surgeon: Katha Cabal, MD;  Location: [facility];  Service: Cardiovascular;  Laterality: Right;  . THORACENTESIS Left 2015    Prior to Admission medications   Medication Sig Start Date End Date Taking? Authorizing Provider  carvedilol (COREG) 6.25  MG tablet TAKE ONE TABLET BY MOUTH TWICE DAILY 12/14/15  Yes Wellington Hampshire, MD  amLODipine (NORVASC) 5 MG tablet Take 0.5 tablets (2.5 mg total) by mouth daily. Patient not taking: Reported on 01/11/2016 09/02/15   [name], MD  aspirin EC 81 MG tablet Take 81 mg by mouth daily.    Historical Provider, MD  atorvastatin (LIPITOR) 80 MG tablet TAKE ONE TABLET BY MOUTH ONCE DAILY Patient not taking: Reported on 01/11/2016 12/27/15   Wellington Hampshire, MD  blood glucose meter kit and supplies KIT Dispense based on patient and insurance preference. Use up to twice daily. (FOR ICD-10 E11.9) Patient not taking: Reported on 01/11/2016 07/01/15   Rubbie Battiest, NP  ferrous sulfate 325 (65 FE) MG tablet Take 325 mg by mouth daily with breakfast.    Historical Provider, MD  furosemide (LASIX) 20 MG tablet  09/09/15   Historical Provider, MD  gabapentin (NEURONTIN) 300 MG capsule  10/17/15   Historical Provider, MD  glucose blood test strip Test 4 times daily Dx. E11.9. One Touch ultra strips Patient not taking: Reported on 01/11/2016 04/19/14   Rubbie Battiest, NP  hydrALAZINE (APRESOLINE) 25 MG tablet Take 1 tablet (25 mg total) by mouth 3 (three) times daily. Patient not taking: Reported on 01/11/2016 12/12/15   Wellington Hampshire, MD  Insulin Glargine (BASAGLAR KWIKPEN) 100 UNIT/ML SOPN Inject 0.1 mLs (10 Units total) into the skin at bedtime. Patient not taking: Reported on 01/11/2016 12/14/15   [name], MD  isosorbide mononitrate (IMDUR) 30 MG 24 hr tablet  10/26/15   Historical Provider, MD  levothyroxine (SYNTHROID, LEVOTHROID) 75 MCG tablet Take 1 tablet (75 mcg total) by mouth daily. Patient not taking: Reported on 01/11/2016 07/01/15   Rubbie Battiest, NP  lisinopril (PRINIVIL,ZESTRIL) 5 MG tablet  11/11/15   Historical Provider, MD  metoCLOPramide (REGLAN) 5 MG tablet Take 5 mg by mouth every 8 (eight) hours as needed for nausea.    Historical Provider, MD  NITROSTAT 0.4 MG SL tablet DISSOLVE ONE  TABLET UNDER THE TONGUE EVERY 5 MINUTES AS NEEDED FOR CHEST PAIN.  DO NOT EXCEED A TOTAL OF 3 DOSES IN 15 MINUTES Patient not taking: Reported on 01/11/2016 07/06/15   Wellington Hampshire, MD  omeprazole (PRILOSEC) 40 MG capsule Take 1 capsule (40 mg total) by mouth 2 (two) times daily before a meal. Patient not taking: Reported on 01/11/2016 12/16/15   [name], MD  ondansetron (ZOFRAN) 4 MG tablet  08/31/15   Historical Provider, MD  ondansetron (ZOFRAN-ODT) 8 MG disintegrating tablet DISSOLVE ONE TABLET IN MOUTH EVERY 8 HOURS AS NEEDED FOR NAUSEA AND VOMITING Patient not taking: Reported on 01/11/2016 12/12/15   [name], MD  promethazine (PHENERGAN) 12.5 MG tablet Take 12.5 mg by mouth every 6 (six) hours as needed for nausea or vomiting.    Historical Provider, MD  sodium bicarbonate 650 MG tablet Take 650 mg by mouth 2 (two) times  daily.    Historical Provider, MD  traZODone (DESYREL) 50 MG tablet TAKE ONE TABLET BY MOUTH AT BEDTIME AS NEEDED FOR SLEEP Patient not taking: Reported on 01/11/2016 12/12/15   [name], MD  Vitamin D, Ergocalciferol, (DRISDOL) 50000 units CAPS capsule Take 50,000 Units by mouth every 7 (seven) days.    Historical Provider, MD    Family History  Problem Relation Age of Onset  . COPD Mother   . Cancer Mother     Lung  . Pulmonary embolism Father   . Diabetes Father   . Diabetes Paternal Grandfather   . Heart disease Maternal Grandmother   . Colon cancer Neg Hx   . Colon polyps Neg Hx   . Esophageal cancer Neg Hx   . Pancreatic cancer Neg Hx   . Liver disease Neg Hx      Social History  Substance Use Topics  . Smoking status: Never Smoker  . Smokeless tobacco: Never Used  . Alcohol use No    Allergies as of 01/11/2016  . (No Known Allergies)    Review of Systems:    All systems reviewed and negative except where noted in HPI.   Physical Exam:  BP (!) 167/86   Pulse 67   Temp 97.8 F (36.6 C) (Oral)   Ht '5\' 4"'$  (1.626 m)   Wt  193 lb (87.5 kg)   BMI 33.13 kg/m  No LMP recorded. Patient is postmenopausal. Psych:  Alert and cooperative. Normal mood and affect. General:   Alert,  Well-developed, well-nourished, pleasant and cooperative in NAD Head:  Normocephalic and atraumatic. Eyes:  Sclera clear, no icterus.   Conjunctiva pink. Ears:  Normal auditory acuity. Nose:  No deformity, discharge, or lesions. Mouth:  No deformity or lesions,oropharynx pink & moist. Neck:  Supple; no masses or thyromegaly. Lungs:  Respirations even and unlabored.  Clear throughout to auscultation.   No wheezes, crackles, or rhonchi. No acute distress. Heart:  Regular rate and rhythm; no murmurs, clicks, rubs, or gallops. Abdomen:  Normal bowel sounds.  No bruits.  Soft, non-tender and non-distended without masses, hepatosplenomegaly or hernias noted.  No guarding or rebound tenderness.    Msk:  Symmetrical without gross deformities. Good, equal movement & strength bilaterally. Pulses:  Normal pulses noted. Extremities:  No clubbing or edema.  No cyanosis. Neurologic:  Alert and oriented x3;  grossly normal neurologically. Skin:  Intact without significant lesions or rashes. No jaundice. Lymph Nodes:  No significant cervical adenopathy. Psych:  Alert and cooperative. Normal mood and affect.  Imaging Studies: No results found.  Assessment and Plan:   Jamie Arias is a 60 y.o. y/o female has been referred for abdominal pain and associated nausea. Her history is consistent with irritable bowel with constipation. I did suggest a colonoscopy to r/o colon cancer but she is not keen . Willing to do a CT colonography  Plan 1. Check H pylori stool antigen 2. CT colonography  3.  Daily miralax- samples provided , if no better will change to amitiza or linzess  Follow up in 2 weeks   Dr Jonathon Bellows MD

## 2016-01-12 ENCOUNTER — Other Ambulatory Visit: Payer: Self-pay | Admitting: Surgical

## 2016-01-12 MED ORDER — INSULIN PEN NEEDLE 31G X 5 MM MISC: 2 refills | Status: DC

## 2016-01-12 MED ORDER — INSULIN PEN NEEDLE 31G X 5 MM MISC: 5 refills | Status: DC

## 2016-01-13 ENCOUNTER — Telehealth: Payer: Self-pay

## 2016-01-13 NOTE — Telephone Encounter (Signed)
FYI.Marland KitchenMarland KitchenSpoke with pt regarding scheduling virtual colonoscopy at [facility] in [place] and she stated that they are in the process of moving and she will call me next month when they are settled for me to set up. She does want it done.

## 2016-01-16 ENCOUNTER — Encounter: Payer: Self-pay | Admitting: Pharmacist

## 2016-01-16 ENCOUNTER — Ambulatory Visit: Payer: Self-pay | Admitting: Pharmacy Technician

## 2016-01-16 ENCOUNTER — Ambulatory Visit: Payer: Self-pay | Admitting: Pharmacist

## 2016-01-16 VITALS — BP 210/107 | Ht 64.0 in | Wt 189.0 lb

## 2016-01-16 DIAGNOSIS — Z79899 Other long term (current) drug therapy: Secondary | ICD-10-CM

## 2016-01-16 NOTE — Progress Notes (Signed)
[facility] Visit Note  Patient: Jamie Arias MRN: [id number] Date of Birth: December 05, 1955 PCP: [name], MD   [name] 60 y.o. female presents for an initial MTM visit today.  BP (!) 210/107 (BP Location: Right Arm)   Ht 5\' 4"  (1.626 m)   Wt 189 lb (85.7 kg)   BMI 32.44 kg/m   Patient Information   Past Medical History:  Diagnosis Date  . Anemia   . CHF (congestive heart failure) ([place])   . Chronic kidney disease    EFGR 31  . Chronic systolic heart failure ([place])    a. Due to ischemic cardiomyopathy. EF as low as 35%, improved to normal s/p CABG; b. echo 07/06/13: EF 55-60%, no RWMAs, mod TR, trivial pericardial effusion not c/w tamponade physiology  . Coronary artery disease    a. NSTEMI 06/2013; b.cath: severe three-vessel CAD w/ EF 30% & mild-mod MR; c. s/p 3 vessel CABG 07/02/13 (LIMA-LAD, SVG-OM, and SVG-RPDA).  . Diabetes mellitus without complication ([place])   . Diabetic neuropathy ([place])   . Hyperlipidemia   . Hypertension   . Hypothyroidism   . Myocardial infarction 2015  . Neuropathy ([place])   . Pleural effusion 2015  . Pulmonary hypertension   . Wears dentures    full lower      Past Surgical History:  Procedure Laterality Date  . CATARACT EXTRACTION Bilateral   . CHOLECYSTECTOMY N/A 12/09/2014   Procedure: LAPAROSCOPIC CHOLECYSTECTOMY;  Surgeon: Marlyce [name], MD;  Location: [facility];  Service: General;  Laterality: N/A;  . CORONARY ARTERY BYPASS GRAFT N/A 07/02/2013   Procedure: CORONARY ARTERY BYPASS GRAFTING (CABG);  Surgeon: Ivin [name], MD;  Location: [facility];  Service: Open Heart Surgery;  Laterality: N/A;  CABG x three, using left internal mammary artery and right leg greater saphenous vein harvested endoscopically  . ESOPHAGOGASTRODUODENOSCOPY (EGD) WITH PROPOFOL N/A 11/24/2015   Procedure: ESOPHAGOGASTRODUODENOSCOPY (EGD) WITH PROPOFOL;  Surgeon: [name], MD;  Location: [facility];  Service: Endoscopy;   Laterality: N/A;  Diabetic - insulin  . INTRAOPERATIVE TRANSESOPHAGEAL ECHOCARDIOGRAM N/A 07/02/2013   Procedure: INTRAOPERATIVE TRANSESOPHAGEAL ECHOCARDIOGRAM;  Surgeon: Ivin [name], MD;  Location: [facility];  Service: Open Heart Surgery;  Laterality: N/A;  . PERIPHERAL VASCULAR CATHETERIZATION Right 12/06/2015   Procedure: Dialysis/Perma Catheter Insertion;  Surgeon: Katha Cabal, MD;  Location: [facility];  Service: Cardiovascular;  Laterality: Right;  . THORACENTESIS Left 2015     Family History  Problem Relation Age of Onset  . COPD Mother   . Cancer Mother     Lung  . Pulmonary embolism Father   . Diabetes Father   . Diabetes Paternal Grandfather   . Heart disease Maternal Grandmother   . Colon cancer Neg Hx   . Colon polyps Neg Hx   . Esophageal cancer Neg Hx   . Pancreatic cancer Neg Hx   . Liver disease Neg Hx     New Diagnoses (since last visit): Pt started on dialysis in October 2017  Family Support: Good  Lifestyle:  Diet: Breakfast: mostly eggs; meat Lunch: soup, sandwich  Dinner: Meat and vegetable; salad; spaghetti    Drinks: water, diet soda, occasionally coffee Snacks: no snacks   Current Exercise Habits: The patient does not participate in regular exercise at present  Exercise limited by: Other - see comments (Pt recently started dialysis but has been moving around more recently and is starting to feel a little better)    History  Alcohol Use No     History  Smoking Status  . Never Smoker  Smokeless Tobacco  . Never Used      Health Maintenance  Topic Date Due  . Hepatitis C Screening  03/11/1955  . HIV Screening  12/08/1968  . MAMMOGRAM  12/09/2003  . COLONOSCOPY  12/09/2003  . OPHTHALMOLOGY EXAM  03/05/2012  . ZOSTAVAX  12/08/2013  . INFLUENZA VACCINE  10/04/2015  . HEMOGLOBIN A1C  03/05/2016  . FOOT EXAM  09/01/2016  . PNEUMOCOCCAL POLYSACCHARIDE VACCINE (2) 10/24/2017  . PAP SMEAR  07/01/2018  . TETANUS/TDAP   04/25/2022     Assessment and Plan: Pt is a pleasant 33 yoF here with her husband to have her first MTM visit. Pt recently started on dialysis and has had to quit work therefore has not had insurance. She is in the process of getting disability but isn't sure when that will become active. Has been out of medications for a couple of months now so we are working to get her RX transferred here from [place] on [address] in [place].  DM: ESRD (HD TTS), nephropathy - which is usually worse at nighttime. Pt unable to check blood sugars because she is out of test strips. 09/03/15 A1c = 6.8 down from April but has been having difficultly with insulin due to insurance issues. Talked about low blood sugars and she says she rarely has these. Was taking Novolog 70/30 mix but is now on the lantus kwikpen (sample from MD) and the pt says she only needs 1 unit at bedtime.  HTN: has monitor at home, checks 1-2x a day. Usually sees SBP 150-160 DBP 75-80; uncontrolled today (219/107 beginning of visit to 204/111 at end of visit). She has been out of bp medications. Told patient she should go to the ER with these elevated readings and she was unwilling to do so. No chest pain, headache, blurred vision. Told patient that if she was not going to go to ER she needed to recheck her bp when she gets home to make sure it is trending down. Only bp medications she currently has are carvidelol and amlodipine at low doses. [facility] is working to get these filled for her by the morning.  MDs: Nephrologist - Dr Rolly Salter GI - Dr Allen Norris GI doctor PCP - [name] Cardiologist - Dr. Sophronia Simas  Will f/u in a month, hopefully she is insured by then. Provided pill box.  Darrow Bussing, PharmD Pharmacy Resident 01/16/2016 3:51 PM

## 2016-01-16 NOTE — Progress Notes (Signed)
Completed [facility] application and contract.  Patient agreed to all terms of the [facility] contract.  Patient stated that she had insurance through the [facility] with [place].  Received letter in October 2017 that [employer] cancelled policy.  Patient received a refund from [facility] for two months of premiums in early November 2017.  Patient contacted [employer] to find out why her insurance was terminated.  Patient was told by [facility] that it was due to nonpayment of monthly premiums.  Patient stated that the monthly payments had been drafted from her checking account.  Patient asked me about who she should reach out to regarding this manner.  I told her that she could reach out to [employer], [employer].  Patient asked if I would call his office during the eligibility appointment.  Spoke with Sheppard Coil at [employer], and was he indicated that patient needed to complete an Child psychotherapist before [employer] office would try to help.  Assisted patient with the completion of the online grievance form.  Patient stated that her BCBS would be reinstated beginning 03/05/16.  [facility] only providing medication assistance until 03/04/16.  Provided patient with other community resource material based on her particular needs.    [facility]

## 2016-01-17 ENCOUNTER — Telehealth: Payer: Self-pay | Admitting: *Deleted

## 2016-01-17 NOTE — Telephone Encounter (Signed)
Can this be switched

## 2016-01-17 NOTE — Telephone Encounter (Signed)
It can be switched. Continue find out what her current dose of NovoLog 70/30?

## 2016-01-17 NOTE — Telephone Encounter (Signed)
Kristen from medication management has requested to have pt switch from Novolog 70/30 to humulin 70/30  Contact Kristen with [facility] medication management  [phone]

## 2016-01-18 ENCOUNTER — Telehealth: Payer: Self-pay | Admitting: Cardiovascular Disease

## 2016-01-18 ENCOUNTER — Ambulatory Visit (INDEPENDENT_AMBULATORY_CARE_PROVIDER_SITE_OTHER): Payer: Self-pay | Admitting: Vascular Surgery

## 2016-01-18 ENCOUNTER — Encounter (INDEPENDENT_AMBULATORY_CARE_PROVIDER_SITE_OTHER): Payer: Self-pay | Admitting: Vascular Surgery

## 2016-01-18 ENCOUNTER — Ambulatory Visit (INDEPENDENT_AMBULATORY_CARE_PROVIDER_SITE_OTHER): Payer: Self-pay

## 2016-01-18 ENCOUNTER — Other Ambulatory Visit: Payer: Self-pay

## 2016-01-18 ENCOUNTER — Telehealth: Payer: Self-pay | Admitting: Family Medicine

## 2016-01-18 VITALS — BP 233/95 | HR 65 | Resp 16 | Ht 64.0 in | Wt 188.6 lb

## 2016-01-18 DIAGNOSIS — N186 End stage renal disease: Secondary | ICD-10-CM

## 2016-01-18 DIAGNOSIS — E785 Hyperlipidemia, unspecified: Secondary | ICD-10-CM

## 2016-01-18 DIAGNOSIS — Z992 Dependence on renal dialysis: Secondary | ICD-10-CM

## 2016-01-18 DIAGNOSIS — E1140 Type 2 diabetes mellitus with diabetic neuropathy, unspecified: Secondary | ICD-10-CM

## 2016-01-18 DIAGNOSIS — N189 Chronic kidney disease, unspecified: Secondary | ICD-10-CM

## 2016-01-18 LAB — H. PYLORI ANTIGEN, STOOL: H pylori Ag, Stl: NEGATIVE

## 2016-01-18 MED ORDER — NITROGLYCERIN 0.4 MG SL SUBL: SUBLINGUAL_TABLET | SUBLINGUAL | 0 refills | Status: DC

## 2016-01-18 MED ORDER — FUROSEMIDE 20 MG PO TABS: 20.0000 mg | ORAL_TABLET | Freq: Every day | ORAL | 0 refills | Status: DC

## 2016-01-18 MED ORDER — CARVEDILOL 6.25 MG PO TABS: 6.2500 mg | ORAL_TABLET | Freq: Two times a day (BID) | ORAL | 0 refills | Status: DC

## 2016-01-18 NOTE — Telephone Encounter (Signed)
°*  STAT* If patient is at the pharmacy, call can be transferred to refill team.   1. Which medications need to be refilled? (please list name of each medication and dose if known)     Carvedilol 6.25 mg po BID  Furosemide 20 mg po daily Nitrostat 0.4 mg po q5 min prn not to exceed x3 in 15 min  2. Which pharmacy/location (including street and city if local pharmacy) is medication to be sent to? [facility]  Clinic fax                   [phone]  3. Do they need a 30 day or 90 day supply?   Has been out of furosemide for 3-4 days

## 2016-01-18 NOTE — Telephone Encounter (Signed)
S/w pt spouse, [name], (on [place]) who requests refills on coreg, nitrostat, and lasix to be sent to [facility] as pt does not currently have insurance. She has been out of work since May. As she is past due for f/u w/Dr. Fletcher Anon, informed spouse I can submit refill for one 30 day supply. I offered him 11/17, 1:30pm appt w/Chris Sharolyn Douglas, NP. He is agreeable w/appt but does not have money for the OV.  Notified Anderson Malta at check-in who will provide pt w/financial assistance paperwork at appt. Advised spouse Dr. Fletcher Anon will need to sign prescriptions for [facility] and we will call when ready. He verbalized understanding w/no further questions at this time.

## 2016-01-18 NOTE — Telephone Encounter (Signed)
Pt husband called and stated that pt needs refills on amLODipine (NORVASC) 5 MG tablet, levothyroxine (SYNTHROID, LEVOTHROID) 75 MCG tablet,metoCLOPramide (REGLAN) 5 MG tablet isosorbide mononitrate (IMDUR) 30 MG 24 hr tablet and gabapentin (NEURONTIN) 300 MG capsule.   Pharmacy - [facility] fax # [phone]  Call pt @ [phone]

## 2016-01-18 NOTE — Telephone Encounter (Signed)
Please advise on refills. Amlodipine has refills left. Synthroid last filled 07/01/15 by [facility]. Last TSH on 06/24/15. Reglan Historical provider, Imdur Historical provider, Gabapentin Historical provider

## 2016-01-19 ENCOUNTER — Encounter (INDEPENDENT_AMBULATORY_CARE_PROVIDER_SITE_OTHER): Payer: Self-pay

## 2016-01-19 DIAGNOSIS — N186 End stage renal disease: Secondary | ICD-10-CM | POA: Insufficient documentation

## 2016-01-19 DIAGNOSIS — Z992 Dependence on renal dialysis: Principal | ICD-10-CM

## 2016-01-19 NOTE — Progress Notes (Signed)
Subjective:    [name], female    DOB: 08-07-1955, 60 y.o.   MRN: [id number] Chief Complaint  Patient presents with  . Follow-up   Patient last seen on 12/19/15 for evaluation of dialysis access creation. She is currently being maintained by a right IJ permcath without issue. Patient is left handed. The patient underwent vein mapping which was amenable to creation of a right brach-axillary graft.    Review of Systems  Constitutional: Negative.   HENT: Negative.   Eyes: Negative.   Respiratory: Negative.   Cardiovascular: Negative.   Gastrointestinal: Negative.   Endocrine: Negative.   Genitourinary:       ESRD  Musculoskeletal: Negative.   Skin: Negative.   Allergic/Immunologic: Negative.   Neurological: Negative.   Hematological: Negative.   Psychiatric/Behavioral: Negative.       Objective:   Physical Exam  Constitutional: She is oriented to person, place, and time. She appears well-developed and well-nourished.  HENT:  Head: Normocephalic and atraumatic.  Right Ear: External ear normal.  Left Ear: External ear normal.  Eyes: Conjunctivae and EOM are normal. Pupils are equal, round, and reactive to light.  Neck: Normal range of motion.  Cardiovascular: Normal rate, regular rhythm, normal heart sounds and intact distal pulses.   Pulses:      Radial pulses are 2+ on the right side, and 2+ on the left side.       Dorsalis pedis pulses are 2+ on the right side, and 2+ on the left side.       Posterior tibial pulses are 2+ on the right side, and 2+ on the left side.  Right IJ intact - no signs of infection noted  Pulmonary/Chest: Effort normal and breath sounds normal.  Abdominal: Soft. Bowel sounds are normal.  Musculoskeletal: Normal range of motion. She exhibits no edema.  Neurological: She is alert and oriented to person, place, and time.  Skin: Skin is warm and dry.  Psychiatric: She has a normal mood and affect. Her behavior is normal. Judgment  and thought content normal.   BP (!) 233/95   Pulse 65   Resp 16   Ht '5\' 4"'$  (1.626 m)   Wt 188 lb 9.6 oz (85.5 kg)   BMI 32.37 kg/m   Past Medical History:  Diagnosis Date  . Anemia   . CHF (congestive heart failure) ([place])   . Chronic kidney disease    EFGR 31  . Chronic systolic heart failure ([place])    a. Due to ischemic cardiomyopathy. EF as low as 35%, improved to normal s/p CABG; b. echo 07/06/13: EF 55-60%, no RWMAs, mod TR, trivial pericardial effusion not c/w tamponade physiology  . Coronary artery disease    a. NSTEMI 06/2013; b.cath: severe three-vessel CAD w/ EF 30% & mild-mod MR; c. s/p 3 vessel CABG 07/02/13 (LIMA-LAD, SVG-OM, and SVG-RPDA).  . Diabetes mellitus without complication ([place])   . Diabetic neuropathy ([place])   . Hyperlipidemia   . Hypertension   . Hypothyroidism   . Myocardial infarction 2015  . Neuropathy ([place])   . Pleural effusion 2015  . Pulmonary hypertension   . Wears dentures    full lower   Social History   Social History  . Marital status: Married    Spouse name: N/A  . Number of children: N/A  . Years of education: N/A   Occupational History  . works for [employer] Topics  . Smoking status:  Never Smoker  . Smokeless tobacco: Never Used  . Alcohol use No  . Drug use: No  . Sexual activity: Not on file   Other Topics Concern  . Not on file   Social History Narrative   Patient lives at home with her husband. Patient has 4 adult children.   Past Surgical History:  Procedure Laterality Date  . CATARACT EXTRACTION Bilateral   . CHOLECYSTECTOMY N/A 12/09/2014   Procedure: LAPAROSCOPIC CHOLECYSTECTOMY;  Surgeon: Ida Rogue, MD;  Location: [facility];  Service: General;  Laterality: N/A;  . CORONARY ARTERY BYPASS GRAFT N/A 07/02/2013   Procedure: CORONARY ARTERY BYPASS GRAFTING (CABG);  Surgeon: Kerin Perna, MD;  Location: [facility];  Service: Open Heart Surgery;  Laterality: N/A;  CABG x three, using  left internal mammary artery and right leg greater saphenous vein harvested endoscopically  . ESOPHAGOGASTRODUODENOSCOPY (EGD) WITH PROPOFOL N/A 11/24/2015   Procedure: ESOPHAGOGASTRODUODENOSCOPY (EGD) WITH PROPOFOL;  Surgeon: Midge Minium, MD;  Location: [facility];  Service: Endoscopy;  Laterality: N/A;  Diabetic - insulin  . INTRAOPERATIVE TRANSESOPHAGEAL ECHOCARDIOGRAM N/A 07/02/2013   Procedure: INTRAOPERATIVE TRANSESOPHAGEAL ECHOCARDIOGRAM;  Surgeon: Kerin Perna, MD;  Location: [facility] OR;  Service: Open Heart Surgery;  Laterality: N/A;  . PERIPHERAL VASCULAR CATHETERIZATION Right 12/06/2015   Procedure: Dialysis/Perma Catheter Insertion;  Surgeon: Renford Dills, MD;  Location: [facility];  Service: Cardiovascular;  Laterality: Right;  . THORACENTESIS Left 2015   Family History  Problem Relation Age of Onset  . COPD Mother   . Cancer Mother     Lung  . Pulmonary embolism Father   . Diabetes Father   . Diabetes Paternal Grandfather   . Heart disease Maternal Grandmother   . Colon cancer Neg Hx   . Colon polyps Neg Hx   . Esophageal cancer Neg Hx   . Pancreatic cancer Neg Hx   . Liver disease Neg Hx    No Known Allergies     Assessment & Plan:  Patient last seen on 12/19/15 for evaluation of dialysis access creation. She is currently being maintained by a right IJ permcath without issue. Patient is left handed. The patient underwent vein mapping which was amenable to creation of a right brach-axillary graft.   1. ESRD on dialysis (HCC) - Stable The patient underwent vein mapping which was amenable to creation of a right brach-axillary graft. We discussed no BP, IV's and blood draws in right arm. Procedure, risks and benefits explained to patient, all questions answered. Patient wishes to proceed.   2. Type 2 diabetes mellitus with diabetic neuropathy, without long-term current use of insulin (HCC) - Stable Encouraged good control as its slows the progression of  atherosclerotic disease  3. Hyperlipidemia, unspecified hyperlipidemia type - Stable Encouraged good control as its slows the progression of atherosclerotic disease  Current Outpatient Prescriptions on File Prior to Visit  Medication Sig Dispense Refill  . amLODipine (NORVASC) 5 MG tablet Take 0.5 tablets (2.5 mg total) by mouth daily. 45 tablet 3  . aspirin EC 81 MG tablet Take 81 mg by mouth daily.    Marland Kitchen atorvastatin (LIPITOR) 80 MG tablet TAKE ONE TABLET BY MOUTH ONCE DAILY 30 tablet 3  . blood glucose meter kit and supplies KIT Dispense based on patient and insurance preference. Use up to twice daily. (FOR ICD-10 E11.9) 1 each 0  . ferrous sulfate 325 (65 FE) MG tablet Take 325 mg by mouth daily with breakfast.    . gabapentin (NEURONTIN)  300 MG capsule 300 mg 3 (three) times daily.   0  . glucose blood test strip Test 4 times daily Dx. E11.9. One Touch ultra strips 100 each 12  . hydrALAZINE (APRESOLINE) 25 MG tablet Take 1 tablet (25 mg total) by mouth 3 (three) times daily. 90 tablet 2  . Insulin Glargine (BASAGLAR KWIKPEN) 100 UNIT/ML SOPN Inject 0.1 mLs (10 Units total) into the skin at bedtime. (Patient taking differently: Inject 1 Units into the skin at bedtime. ) 1 pen 0  . Insulin Pen Needle (GLOBAL EASE INJECT PEN NEEDLES) 31G X 5 MM MISC Inject 0.1 mLs (10 Units total) into the skin at bedtime 90 each 2  . isosorbide mononitrate (IMDUR) 30 MG 24 hr tablet Take 30 mg by mouth daily.   0  . levothyroxine (SYNTHROID, LEVOTHROID) 75 MCG tablet Take 1 tablet (75 mcg total) by mouth daily. 90 tablet 1  . lisinopril (PRINIVIL,ZESTRIL) 5 MG tablet Take 5 mg by mouth daily.   1  . metoCLOPramide (REGLAN) 5 MG tablet Take 5 mg by mouth every 8 (eight) hours as needed for nausea.    Marland Kitchen omeprazole (PRILOSEC) 40 MG capsule Take 1 capsule (40 mg total) by mouth 2 (two) times daily before a meal. 60 capsule 11  . ondansetron (ZOFRAN) 4 MG tablet   0  . ondansetron (ZOFRAN-ODT) 8 MG  disintegrating tablet DISSOLVE ONE TABLET IN MOUTH EVERY 8 HOURS AS NEEDED FOR NAUSEA AND VOMITING 10 tablet 0  . promethazine (PHENERGAN) 12.5 MG tablet Take 12.5 mg by mouth every 6 (six) hours as needed for nausea or vomiting.    . sodium bicarbonate 650 MG tablet Take 650 mg by mouth 2 (two) times daily.    . traZODone (DESYREL) 50 MG tablet TAKE ONE TABLET BY MOUTH AT BEDTIME AS NEEDED FOR SLEEP 30 tablet 3  . Vitamin D, Ergocalciferol, (DRISDOL) 50000 units CAPS capsule Take 50,000 Units by mouth every 7 (seven) days.     No current facility-administered medications on file prior to visit.     There are no Patient Instructions on file for this visit. No Follow-up on file.   Kortney Potvin A Gerritt Galentine, PA-C

## 2016-01-20 ENCOUNTER — Encounter: Payer: Self-pay | Admitting: Nurse Practitioner

## 2016-01-20 ENCOUNTER — Ambulatory Visit (INDEPENDENT_AMBULATORY_CARE_PROVIDER_SITE_OTHER): Payer: Self-pay | Admitting: Nurse Practitioner

## 2016-01-20 VITALS — BP 160/90 | HR 74 | Ht 64.0 in | Wt 188.5 lb

## 2016-01-20 DIAGNOSIS — E782 Mixed hyperlipidemia: Secondary | ICD-10-CM

## 2016-01-20 DIAGNOSIS — I110 Hypertensive heart disease with heart failure: Secondary | ICD-10-CM

## 2016-01-20 DIAGNOSIS — I2510 Atherosclerotic heart disease of native coronary artery without angina pectoris: Secondary | ICD-10-CM

## 2016-01-20 DIAGNOSIS — I5032 Chronic diastolic (congestive) heart failure: Secondary | ICD-10-CM

## 2016-01-20 MED ORDER — AMLODIPINE BESYLATE 5 MG PO TABS: 2.5000 mg | ORAL_TABLET | Freq: Every day | ORAL | 3 refills | Status: DC

## 2016-01-20 MED ORDER — HYDRALAZINE HCL 25 MG PO TABS: 25.0000 mg | ORAL_TABLET | Freq: Three times a day (TID) | ORAL | 3 refills | Status: DC

## 2016-01-20 MED ORDER — GABAPENTIN 300 MG PO CAPS: 300.0000 mg | ORAL_CAPSULE | Freq: Three times a day (TID) | ORAL | 3 refills | Status: DC

## 2016-01-20 MED ORDER — AMLODIPINE BESYLATE 2.5 MG PO TABS: 2.5000 mg | ORAL_TABLET | Freq: Every day | ORAL | 3 refills | Status: DC

## 2016-01-20 MED ORDER — ATORVASTATIN CALCIUM 80 MG PO TABS: 80.0000 mg | ORAL_TABLET | Freq: Every day | ORAL | 3 refills | Status: DC

## 2016-01-20 MED ORDER — LEVOTHYROXINE SODIUM 75 MCG PO TABS: 75.0000 ug | ORAL_TABLET | Freq: Every day | ORAL | 1 refills | Status: DC

## 2016-01-20 MED ORDER — ISOSORBIDE MONONITRATE ER 30 MG PO TB24: 30.0000 mg | ORAL_TABLET | Freq: Every day | ORAL | 3 refills | Status: DC

## 2016-01-20 MED ORDER — METOCLOPRAMIDE HCL 5 MG PO TABS: 5.0000 mg | ORAL_TABLET | Freq: Three times a day (TID) | ORAL | 1 refills | Status: DC | PRN

## 2016-01-20 MED ORDER — CARVEDILOL 6.25 MG PO TABS: 6.2500 mg | ORAL_TABLET | Freq: Two times a day (BID) | ORAL | 3 refills | Status: DC

## 2016-01-20 NOTE — Telephone Encounter (Signed)
Faxed

## 2016-01-20 NOTE — Telephone Encounter (Signed)
Patient is not taking the imdur. She is still taking the Reglan for her stomach issues. Follow up appointment scheduled for 02/22/16

## 2016-01-20 NOTE — Telephone Encounter (Signed)
Please also confirm the patients dose of novolog 70/30. It appears in our system she is supposed to be on 8 units BID. Please confirm with the pharmacy that what they have is humulin (NPH and regular insulin) and not Humalog. Thanks.

## 2016-01-20 NOTE — Telephone Encounter (Signed)
Spoke with Cyril Mourning from medication management. Patient stated that she has been taking the Novolog 38 units BID. They have humulin there and wanted to know the dose they can give and needs a RX for this.

## 2016-01-20 NOTE — Telephone Encounter (Signed)
Please check with the patient on Monday to see if she would like to come continue the sample. We have 1 left and could provided to her next week.

## 2016-01-20 NOTE — Telephone Encounter (Signed)
Refill given on reglan. Please fax.

## 2016-01-20 NOTE — Progress Notes (Signed)
Office Visit    Patient Name: Jamie Arias Date of Encounter: 01/20/2016  Primary Care Provider:  [name], MD Primary Cardiologist:  Jerilynn Mages. Fletcher Anon, MD   Chief Complaint    60 y/o ? with a h/o CAD s/p CABG x 3, diast chf, ESRD now on HD, DM, HTN, and HL, who presents for f/u.  Past Medical History    Past Medical History:  Diagnosis Date  . Anemia   . Chronic diastolic CHF (congestive heart failure) ([place])    a. Due to ischemic cardiomyopathy. EF as low as 35%, improved to normal s/p CABG; b. echo 07/06/13: EF 55-60%, no RWMAs, mod TR, trivial pericardial effusion not c/w tamponade physiology;  c. 10/2015 Echo: EF 65%, Gr1 DD, triv AI, mild MR, mildly dil LA, mod TR, PASP 75mHg.  .Marland KitchenCoronary artery disease    a. NSTEMI 06/2013; b.cath: severe three-vessel CAD w/ EF 30% & mild-mod MR; c. s/p 3 vessel CABG 07/02/13 (LIMA-LAD, SVG-OM, and SVG-RPDA);  d. 10/2015 MV: no ischemia/infarct.  . Diabetes mellitus without complication ([place]   . Diabetic neuropathy ([place]   . ESRD (end stage renal disease) ([place]    a. 12/2015 initiated TTS dialysis.  . Hyperlipidemia   . Hypertension   . Hypothyroidism   . Neuropathy ([place]   . Pleural effusion 2015  . Pulmonary hypertension   . Wears dentures    full lower   Past Surgical History:  Procedure Laterality Date  . CATARACT EXTRACTION Bilateral   . CHOLECYSTECTOMY N/A 12/09/2014   Procedure: LAPAROSCOPIC CHOLECYSTECTOMY;  Surgeon: CMarlyce [name] MD;  Location: [facility];  Service: General;  Laterality: N/A;  . CORONARY ARTERY BYPASS GRAFT N/A 07/02/2013   Procedure: CORONARY ARTERY BYPASS GRAFTING (CABG);  Surgeon: PIvin [name] MD;  Location: [facility]  Service: Open Heart Surgery;  Laterality: N/A;  CABG x three, using left internal mammary artery and right leg greater saphenous vein harvested endoscopically  . ESOPHAGOGASTRODUODENOSCOPY (EGD) WITH PROPOFOL N/A 11/24/2015   Procedure: ESOPHAGOGASTRODUODENOSCOPY (EGD) WITH PROPOFOL;   Surgeon: DLucilla [name] MD;  Location: [facility]  Service: Endoscopy;  Laterality: N/A;  Diabetic - insulin  . INTRAOPERATIVE TRANSESOPHAGEAL ECHOCARDIOGRAM N/A 07/02/2013   Procedure: INTRAOPERATIVE TRANSESOPHAGEAL ECHOCARDIOGRAM;  Surgeon: PIvin [name] MD;  [facility]  Service: Open Heart Surgery;  Laterality: N/A;  . PERIPHERAL VASCULAR CATHETERIZATION Right 12/06/2015   Procedure: Dialysis/Perma Catheter Insertion;  Surgeon: GKatha Cabal MD;  [facility];  Service: Cardiovascular;  Laterality: Right;  . THORACENTESIS Left 2015    Allergies  No Known Allergies  History of Present Illness    60y/o ? with the above complex PMH including CAD s/p NSTEMI in 06/2013 followed by CABG x 3.  EF @ the time was low but this subsequently normalized.  She also has a h/o HTN, HL, DM, and renal dzs, and was placed on Tues/Thurs/Sat HD beginning in October.  [facility] in the setting of nausea and emesis.  We performed a MV, which was neg.  Since then, she has done well from a cardiac standpoint but she has run out of several of her meds.  She has also had progression of renal dzs and as is noted above, is now on HD.  She denies chest pain, palpitations, dyspnea, pnd, orthopnea, n, v, dizziness, syncope, edema, weight gain, or early satiety.   Home Medications    Prior to Admission medications  Medication Sig Start Date End Date Taking? Authorizing Provider  atorvastatin (LIPITOR) 80 MG tablet Take 1 tablet (80 mg total) by mouth daily. 01/20/16  Yes Wellington Hampshire, MD  blood glucose meter kit and supplies KIT Dispense based on patient and insurance preference. Use up to twice daily. (FOR ICD-10 E11.9) 07/01/15  Yes Rubbie Battiest, NP  carvedilol (COREG) 6.25 MG tablet Take 1 tablet (6.25 mg total) by mouth 2 (two) times daily. 01/20/16  Yes Wellington Hampshire, MD  ferrous sulfate 325 (65 FE) MG tablet Take 325 mg by mouth  daily with breakfast.   Yes Historical Provider, MD  gabapentin (NEURONTIN) 300 MG capsule Take 1 capsule (300 mg total) by mouth 3 (three) times daily. 01/20/16  Yes [name], MD  glucose blood test strip Test 4 times daily Dx. E11.9. One Touch ultra strips 04/19/14  Yes Rubbie Battiest, NP  hydrALAZINE (APRESOLINE) 25 MG tablet Take 1 tablet (25 mg total) by mouth 3 (three) times daily. 01/20/16  Yes Wellington Hampshire, MD  Insulin Glargine (BASAGLAR KWIKPEN) 100 UNIT/ML SOPN Inject 0.1 mLs (10 Units total) into the skin at bedtime. Patient taking differently: Inject 1 Units into the skin at bedtime.  12/14/15  Yes [name], MD  Insulin Pen Needle (GLOBAL EASE INJECT PEN NEEDLES) 31G X 5 MM MISC Inject 0.1 mLs (10 Units total) into the skin at bedtime 01/12/16  Yes [name], MD  isosorbide mononitrate (IMDUR) 30 MG 24 hr tablet Take 1 tablet (30 mg total) by mouth daily. 01/20/16  Yes Wellington Hampshire, MD  levothyroxine (SYNTHROID, LEVOTHROID) 75 MCG tablet Take 1 tablet (75 mcg total) by mouth daily. 01/20/16  Yes [name], MD  metoCLOPramide (REGLAN) 5 MG tablet Take 1 tablet (5 mg total) by mouth every 8 (eight) hours as needed for nausea. 01/20/16  Yes [name], MD  nitroGLYCERIN (NITROSTAT) 0.4 MG SL tablet DISSOLVE ONE TABLET UNDER THE TONGUE EVERY 5 MINUTES AS NEEDED FOR CHEST PAIN.  DO NOT EXCEED A TOTAL OF 3 DOSES IN 15 MINUTES 01/18/16  Yes Wellington Hampshire, MD  omeprazole (PRILOSEC) 40 MG capsule Take 1 capsule (40 mg total) by mouth 2 (two) times daily before a meal. 12/16/15  Yes [name], MD  ondansetron (ZOFRAN) 4 MG tablet  08/31/15  Yes Historical Provider, MD  ondansetron (ZOFRAN-ODT) 8 MG disintegrating tablet DISSOLVE ONE TABLET IN MOUTH EVERY 8 HOURS AS NEEDED FOR NAUSEA AND VOMITING 12/12/15  Yes [name], MD  promethazine (PHENERGAN) 12.5 MG tablet Take 12.5 mg by mouth every 6 (six) hours as needed for nausea or vomiting.   Yes  Historical Provider, MD  traZODone (DESYREL) 50 MG tablet TAKE ONE TABLET BY MOUTH AT BEDTIME AS NEEDED FOR SLEEP 12/12/15  Yes [name], MD  Vitamin D, Ergocalciferol, (DRISDOL) 50000 units CAPS capsule Take 50,000 Units by mouth every 7 (seven) days.   Yes Historical Provider, MD  amLODipine (NORVASC) 2.5 MG tablet Take 1 tablet (2.5 mg total) by mouth daily. 01/20/16 04/19/16  Wellington Hampshire, MD    Review of Systems    She denies chest pain, palpitations, dyspnea, pnd, orthopnea, n, v, dizziness, syncope, edema, weight gain, or early satiety.  All other systems reviewed and are otherwise negative except as noted above.  Physical Exam    VS:  BP (!) 160/90 (BP Location: Left Arm, Patient Position: Sitting, Cuff Size: Normal)   Pulse 74  Ht 5' 4" (1.626 m)   Wt 188 lb 8 oz (85.5 kg)   BMI 32.36 kg/m  , BMI Body mass index is 32.36 kg/m. GEN: Well nourished, well developed, in no acute distress.  HEENT: normal.  Neck: Supple, no JVD, carotid bruits, or masses. Cardiac: RRR,2/6 sem @ usb, no rubs, or gallops. No clubbing, cyanosis, edema.  Radials/DP/PT 2+ and equal bilaterally.  Respiratory:  Respirations regular and unlabored, clear to auscultation bilaterally. GI: Soft, nontender, nondistended, BS + x 4. MS: no deformity or atrophy. Skin: warm and dry, no rash. Neuro:  Strength and sensation are intact. Psych: Normal affect.  Accessory Clinical Findings    Labs and myoview reviewed.  Assessment & Plan    1.  CAD:  S/p prior CABG in 2015 with neg MV in August.  Doing well w/o chest pain.  She is out of several of her meds including coreg, lipitor, and imdur.  I will refill these today.  2.  Hypertensive heart disease w/ chf; chronic diast chf:  BP elevated today.  As above, off of several meds. Refilling coreg, amlodipine, and imdur.  She was prev on lasix and lisinopril but had run out of these as well.  As she has been off of them for several wks and is now on HD, I  will defer any refills to nephrology.  3.  HL:  Refilling lipitor.  Reviewed LDL's - it looks like she was probably not taking lipitor in August.  LFT's were ok then.  Resume lipitor and f/u lipids/lft's @ f/u visit.  4.  ESRD:  Now on HD.  Followed closely by nephrology.  5.  DM 2: Per IM.  6.  Dispo:  F/u with Dr. Fletcher Anon in 6 mos or sooner if necessary.   Murray Hodgkins, NP 01/20/2016, 3:59 PM

## 2016-01-20 NOTE — Telephone Encounter (Signed)
Spoke with patient and she is not currently taking the novolog. She is taking the sample of Basaglar  that was given to her. She has about 1 weeks worth left.

## 2016-01-20 NOTE — Patient Instructions (Signed)
Medication Instructions:  Refills provided to you.   Follow-Up: Your physician wants you to follow-up in: 6 months with Dr. Fletcher Anon. You will receive a reminder letter in the mail two months in advance. If you don't receive a letter, please call our office to schedule the follow-up appointment.  It was a pleasure seeing you today here in the office. Please do not hesitate to give [place] a call back if you have any further questions. [place], BSN

## 2016-01-20 NOTE — Telephone Encounter (Signed)
Please fax the signed orders. Please contact the patient to see who placed her on imdur and reglan. Please also get her set up for a follow-up appointment.

## 2016-01-20 NOTE — Telephone Encounter (Signed)
Spoke with patient and she stated that she takes the Novolog 8 units BID. Never picked up the Humalog due to cost

## 2016-01-23 ENCOUNTER — Telehealth: Payer: Self-pay | Admitting: Cardiovascular Disease

## 2016-01-23 NOTE — Telephone Encounter (Signed)
Pt requesting Rx for Furosemide.  Furosemide is not on patient's medication list and showing last OV with C.Berge looks like he recommended Refills to defer to nephrology. Please advise pt.

## 2016-01-23 NOTE — Telephone Encounter (Signed)
Patient called medication management clinic for a furosemide rx. They do not have this medication on file for the patient in [place] .  Please call or fax rx.

## 2016-01-23 NOTE — Telephone Encounter (Signed)
S/w pt who understands she needs to s/w nephrologist for lasix refills. She has already contacted Dr. Assunta Gambles office and they will send refills.

## 2016-01-23 NOTE — Telephone Encounter (Signed)
LM for patient to return call.

## 2016-01-24 ENCOUNTER — Other Ambulatory Visit (INDEPENDENT_AMBULATORY_CARE_PROVIDER_SITE_OTHER): Payer: Self-pay | Admitting: Vascular Surgery

## 2016-01-24 MED ORDER — BASAGLAR KWIKPEN 100 UNIT/ML ~~LOC~~ SOPN: 10.0000 [IU] | PEN_INJECTOR | Freq: Every day | SUBCUTANEOUS | 0 refills | Status: DC

## 2016-01-24 NOTE — Telephone Encounter (Signed)
Patients husband came by to pick up sample. I filled out the information on the sheet and label with same instructions as before. Please place sample order in epic. Thank you

## 2016-01-24 NOTE — Telephone Encounter (Signed)
Order placed

## 2016-01-25 ENCOUNTER — Telehealth: Payer: Self-pay | Admitting: Family Medicine

## 2016-01-25 ENCOUNTER — Ambulatory Visit: Payer: Self-pay | Admitting: Gastroenterology

## 2016-01-25 MED ORDER — BASAGLAR KWIKPEN 100 UNIT/ML ~~LOC~~ SOPN: 5.0000 [IU] | PEN_INJECTOR | Freq: Every day | SUBCUTANEOUS | 0 refills | Status: DC

## 2016-01-25 MED ORDER — GLUCOSE BLOOD VI STRP: ORAL_STRIP | 12 refills | Status: DC

## 2016-01-25 NOTE — Telephone Encounter (Signed)
Patient should be using 10 units of basaglar nightly. They should be monitoring her blood sugars. Please find out how much insulin they've been giving her. Thanks.

## 2016-01-25 NOTE — Telephone Encounter (Signed)
Patient husband states they've only been using 1 unit he was confused with other insulin they were prescribed previously.   The other day when she was at dialysis blood sugar was 140.  Patients husband was advised to give Basaglar 10 units qhs per Kenney Houseman RN ok since long acting insulin.    He states they have a [employer] brand meter and however they don't have test strips unsure if they will be able to afford due to no insurance.  Patient has dialysis on Saturday and will get them to check her blood sugar there and he will record the reading and let us know on Monday  Patient has diaylsis three times a week and they always check blood sugar and will record readings and let us know.

## 2016-01-25 NOTE — Telephone Encounter (Signed)
Pt husband called and had a question in regards to her insulin and how many units she should be using. Please advise, thank you!  Call pt @ [phone]

## 2016-01-25 NOTE — Telephone Encounter (Signed)
Called and spoke with patient and her husband regarding prior message. Discussed taking 5 units nightly instead of 10 units nightly given single recent well-controlled blood sugar and recently starting dialysis. Discussed that I would send in test trips as well to have them check her blood sugar if they can afford the test strips. Discussed hypoglycemic symptoms and management of this. If symptoms do not resolve with eating something she should seek medical attention.

## 2016-01-25 NOTE — Telephone Encounter (Signed)
Patients husband wants to clarify how many units she should be using of insulin   According to chart is looks like 10 units q hs  ?

## 2016-01-30 ENCOUNTER — Encounter
Admission: RE | Admit: 2016-01-30 | Discharge: 2016-01-30 | Disposition: A | Discharge status: Home / Self Care | Payer: Self-pay | Source: Ambulatory Visit | Attending: Vascular Surgery | Admitting: Vascular Surgery

## 2016-01-30 DIAGNOSIS — N186 End stage renal disease: Secondary | ICD-10-CM | POA: Insufficient documentation

## 2016-01-30 DIAGNOSIS — Z01812 Encounter for preprocedural laboratory examination: Secondary | ICD-10-CM | POA: Insufficient documentation

## 2016-01-30 HISTORY — DX: Gastro-esophageal reflux disease without esophagitis: K21.9

## 2016-01-30 HISTORY — DX: Dependence on renal dialysis: Z99.2

## 2016-01-30 LAB — CBC WITH DIFFERENTIAL/PLATELET
Basophils Absolute: 0.1 10*3/uL (ref 0–0.1)
Basophils Relative: 1 %
Eosinophils Absolute: 0.2 10*3/uL (ref 0–0.7)
Eosinophils Relative: 3 %
HCT: 32 % — ABNORMAL LOW (ref 35.0–47.0)
Hemoglobin: 10.8 g/dL — ABNORMAL LOW (ref 12.0–16.0)
Lymphocytes Relative: 20 %
Lymphs Abs: 1.2 10*3/uL (ref 1.0–3.6)
MCH: 29.4 pg (ref 26.0–34.0)
MCHC: 33.8 g/dL (ref 32.0–36.0)
MCV: 87.1 fL (ref 80.0–100.0)
Monocytes Absolute: 0.4 10*3/uL (ref 0.2–0.9)
Monocytes Relative: 6 %
Neutro Abs: 4 10*3/uL (ref 1.4–6.5)
Neutrophils Relative %: 70 %
Platelets: 174 10*3/uL (ref 150–440)
RBC: 3.68 MIL/uL — ABNORMAL LOW (ref 3.80–5.20)
RDW: 15.7 % — ABNORMAL HIGH (ref 11.5–14.5)
WBC: 5.7 10*3/uL (ref 3.6–11.0)

## 2016-01-30 LAB — BASIC METABOLIC PANEL
Anion gap: 9 (ref 5–15)
BUN: 30 mg/dL — ABNORMAL HIGH (ref 6–20)
CO2: 25 mmol/L (ref 22–32)
Calcium: 8.2 mg/dL — ABNORMAL LOW (ref 8.9–10.3)
Chloride: 101 mmol/L (ref 101–111)
Creatinine, Ser: 3.53 mg/dL — ABNORMAL HIGH (ref 0.44–1.00)
GFR calc Af Amer: 15 mL/min — ABNORMAL LOW (ref >60)
GFR calc non Af Amer: 13 mL/min — ABNORMAL LOW (ref >60)
Glucose, Bld: 252 mg/dL — ABNORMAL HIGH (ref 65–99)
Potassium: 3.6 mmol/L (ref 3.5–5.1)
Sodium: 135 mmol/L (ref 135–145)

## 2016-01-30 LAB — TYPE AND SCREEN
ABO/RH(D): A POS
Antibody Screen: NEGATIVE

## 2016-01-30 LAB — APTT: aPTT: 28 seconds (ref 24–36)

## 2016-01-30 LAB — PROTIME-INR
INR: 0.99
Prothrombin Time: 13.1 seconds (ref 11.4–15.2)

## 2016-01-30 LAB — SURGICAL PCR SCREEN
MRSA, PCR: NEGATIVE
Staphylococcus aureus: NEGATIVE

## 2016-01-30 NOTE — Telephone Encounter (Signed)
Also see more recent phone note.

## 2016-01-30 NOTE — Patient Instructions (Signed)
  Your procedure is scheduled on: February 03, 2016 (Friday) Report to Same Day Surgery 2nd floor medical mall [facility] Entrance-take elevator on left to 2nd floor.  Check in with surgery information desk.) To find out your arrival time please call [phone] between 1PM - 3PM on February 02, 2016 (Thursday)  Remember: Instructions that are not followed completely may result in serious medical risk, up to and including death, or upon the discretion of your surgeon and anesthesiologist your surgery may need to be rescheduled.    _x___ 1. Do not eat food or drink liquids after midnight. No gum chewing or hard candies.     __x__ 2. No Alcohol for 24 hours before or after surgery.   __x__3. No Smoking for 24 prior to surgery.   ____  4. Bring all medications with you on the day of surgery if instructed.    __x__ 5. Notify your doctor if there is any change in your medical condition     (cold, fever, infections).     Do not wear jewelry, make-up, hairpins, clips or nail polish.  Do not wear lotions, powders, or perfumes. You may wear deodorant.  Do not shave 48 hours prior to surgery. Men may shave face and neck.  Do not bring valuables to the hospital.    [facility] is not responsible for any belongings or valuables.               Contacts, dentures or bridgework may not be worn into surgery.  Leave your suitcase in the car. After surgery it may be brought to your room.  For patients admitted to the hospital, discharge time is determined by your treatment team.   Patients discharged the day of surgery will not be allowed to drive home.  You will need someone to drive you home and stay with you the night of your procedure.    Please read over the following fact sheets that you were given:   [facility] Preparing for Surgery and or MRSA Information   _x___ Take these medicines the morning of surgery with A SIP OF WATER:    1. Amlodipine  2. Carvedilol  3. Gabapentin  4.  HydrAlazine  5. Isosorbide  6. Lisinopril             7. Omeprazole  ____Fleets enema or Magnesium Citrate as directed.    __ Use CHG Soap or sage wipes as directed on instruction sheet   ____ Use inhalers on the day of surgery and bring to hospital day of surgery  ____ Stop metformin 2 days prior to surgery    __x__ Take 1/2 of usual insulin dose the night before surgery and none on the morning of surgery   (TAKE ONE-HALF OF YOUR BEDTIME INSULIN ON NOVEMBER 30, AND NO INSULIN THE MORNING OF SURGERY)       ____ Stop Aspirin, Coumadin, Pllavix ,Eliquis, Effient, or Pradaxa  x__ Stop Anti-inflammatories such as Advil, Aleve, Ibuprofen, Motrin, Naproxen,          Naprosyn, Goodies powders or aspirin products. Ok to take Tylenol.   ____ Stop supplements until after surgery.    ____ Bring C-Pap to the hospital.

## 2016-02-02 MED ORDER — CEFAZOLIN SODIUM-DEXTROSE 2-4 GM/100ML-% IV SOLN
2.0000 g | INTRAVENOUS | Status: AC
  Administered 2016-02-03: 2 g via INTRAVENOUS

## 2016-02-03 ENCOUNTER — Ambulatory Visit
Admission: RE | Admit: 2016-02-03 | Discharge: 2016-02-03 | Disposition: A | Discharge status: Home / Self Care | Payer: Self-pay | Source: Ambulatory Visit | Attending: Vascular Surgery | Admitting: Vascular Surgery

## 2016-02-03 ENCOUNTER — Encounter: Payer: Self-pay | Admitting: *Deleted

## 2016-02-03 ENCOUNTER — Ambulatory Visit: Payer: Self-pay | Admitting: Anesthesiology

## 2016-02-03 ENCOUNTER — Encounter
Admission: RE | Disposition: A | Discharge status: Home / Self Care | Payer: Self-pay | Source: Ambulatory Visit | Attending: Vascular Surgery

## 2016-02-03 DIAGNOSIS — E1122 Type 2 diabetes mellitus with diabetic chronic kidney disease: Secondary | ICD-10-CM | POA: Insufficient documentation

## 2016-02-03 DIAGNOSIS — I255 Ischemic cardiomyopathy: Secondary | ICD-10-CM | POA: Insufficient documentation

## 2016-02-03 DIAGNOSIS — Z992 Dependence on renal dialysis: Secondary | ICD-10-CM | POA: Insufficient documentation

## 2016-02-03 DIAGNOSIS — E1140 Type 2 diabetes mellitus with diabetic neuropathy, unspecified: Secondary | ICD-10-CM | POA: Insufficient documentation

## 2016-02-03 DIAGNOSIS — I5032 Chronic diastolic (congestive) heart failure: Secondary | ICD-10-CM | POA: Insufficient documentation

## 2016-02-03 DIAGNOSIS — K219 Gastro-esophageal reflux disease without esophagitis: Secondary | ICD-10-CM | POA: Insufficient documentation

## 2016-02-03 DIAGNOSIS — N186 End stage renal disease: Secondary | ICD-10-CM

## 2016-02-03 DIAGNOSIS — D649 Anemia, unspecified: Secondary | ICD-10-CM | POA: Insufficient documentation

## 2016-02-03 DIAGNOSIS — Z79899 Other long term (current) drug therapy: Secondary | ICD-10-CM | POA: Insufficient documentation

## 2016-02-03 DIAGNOSIS — I252 Old myocardial infarction: Secondary | ICD-10-CM | POA: Insufficient documentation

## 2016-02-03 DIAGNOSIS — I2510 Atherosclerotic heart disease of native coronary artery without angina pectoris: Secondary | ICD-10-CM | POA: Insufficient documentation

## 2016-02-03 DIAGNOSIS — Z951 Presence of aortocoronary bypass graft: Secondary | ICD-10-CM | POA: Insufficient documentation

## 2016-02-03 DIAGNOSIS — E785 Hyperlipidemia, unspecified: Secondary | ICD-10-CM | POA: Insufficient documentation

## 2016-02-03 DIAGNOSIS — I132 Hypertensive heart and chronic kidney disease with heart failure and with stage 5 chronic kidney disease, or end stage renal disease: Secondary | ICD-10-CM | POA: Insufficient documentation

## 2016-02-03 DIAGNOSIS — E039 Hypothyroidism, unspecified: Secondary | ICD-10-CM | POA: Insufficient documentation

## 2016-02-03 DIAGNOSIS — F329 Major depressive disorder, single episode, unspecified: Secondary | ICD-10-CM | POA: Insufficient documentation

## 2016-02-03 HISTORY — PX: AV FISTULA PLACEMENT: SHX1204

## 2016-02-03 LAB — POCT I-STAT 4, (NA,K, GLUC, HGB,HCT)
Glucose, Bld: 122 mg/dL — ABNORMAL HIGH (ref 65–99)
HCT: 33 % — ABNORMAL LOW (ref 36.0–46.0)
Hemoglobin: 11.2 g/dL — ABNORMAL LOW (ref 12.0–15.0)
Potassium: 3.1 mmol/L — ABNORMAL LOW (ref 3.5–5.1)
Sodium: 138 mmol/L (ref 135–145)

## 2016-02-03 LAB — GLUCOSE, CAPILLARY
Glucose-Capillary: 131 mg/dL — ABNORMAL HIGH (ref 65–99)
Glucose-Capillary: 126 mg/dL — ABNORMAL HIGH (ref 65–99)

## 2016-02-03 SURGERY — INSERTION OF ARTERIOVENOUS (AV) GORE-TEX GRAFT ARM
Anesthesia: General | Site: Arm Upper | Laterality: Right | Wound class: Clean

## 2016-02-03 MED ORDER — PROPOFOL 10 MG/ML IV BOLUS
INTRAVENOUS | Status: DC | PRN
  Administered 2016-02-03: 20 mg via INTRAVENOUS
  Administered 2016-02-03: 150 mg via INTRAVENOUS

## 2016-02-03 MED ORDER — HEPARIN SODIUM (PORCINE) 1000 UNIT/ML IJ SOLN
INTRAMUSCULAR | Status: DC | PRN
  Administered 2016-02-03: 40 mL via INTRAMUSCULAR

## 2016-02-03 MED ORDER — OXYCODONE HCL 5 MG PO TABS: 5.0000 mg | ORAL_TABLET | Freq: Once | ORAL | Status: DC | PRN

## 2016-02-03 MED ORDER — PROMETHAZINE HCL 25 MG/ML IJ SOLN: 6.2500 mg | INTRAMUSCULAR | Status: DC | PRN

## 2016-02-03 MED ORDER — HEPARIN SODIUM (PORCINE) 5000 UNIT/ML IJ SOLN
INTRAMUSCULAR | Status: AC
  Filled 2016-02-03: qty 1

## 2016-02-03 MED ORDER — MIDAZOLAM HCL 2 MG/2ML IJ SOLN
INTRAMUSCULAR | Status: DC | PRN
Start: 2016-02-03 — End: 2016-02-03
  Administered 2016-02-03: 2 mg via INTRAVENOUS

## 2016-02-03 MED ORDER — EPHEDRINE SULFATE 50 MG/ML IJ SOLN
INTRAMUSCULAR | Status: DC | PRN
  Administered 2016-02-03 (×5): 5 mg via INTRAVENOUS

## 2016-02-03 MED ORDER — OXYCODONE HCL 5 MG/5ML PO SOLN: 5.0000 mg | Freq: Once | ORAL | Status: DC | PRN

## 2016-02-03 MED ORDER — CHLORHEXIDINE GLUCONATE CLOTH 2 % EX PADS: 6.0000 | MEDICATED_PAD | Freq: Once | CUTANEOUS | Status: DC

## 2016-02-03 MED ORDER — PAPAVERINE HCL 30 MG/ML IJ SOLN
INTRAMUSCULAR | Status: AC
  Filled 2016-02-03: qty 2

## 2016-02-03 MED ORDER — LIDOCAINE HCL (CARDIAC) 20 MG/ML IV SOLN
INTRAVENOUS | Status: DC | PRN
  Administered 2016-02-03: 40 mg via INTRAVENOUS

## 2016-02-03 MED ORDER — OXYCODONE-ACETAMINOPHEN 5-325 MG PO TABS: 1.0000 | ORAL_TABLET | Freq: Four times a day (QID) | ORAL | 0 refills | Status: DC | PRN

## 2016-02-03 MED ORDER — PHENYLEPHRINE HCL 10 MG/ML IJ SOLN
INTRAMUSCULAR | Status: DC | PRN
  Administered 2016-02-03 (×3): 50 ug via INTRAVENOUS

## 2016-02-03 MED ORDER — SODIUM CHLORIDE 0.9 % IV SOLN
INTRAVENOUS | Status: DC
  Administered 2016-02-03: 11:00:00 via INTRAVENOUS

## 2016-02-03 MED ORDER — ONDANSETRON HCL 4 MG/2ML IJ SOLN
INTRAMUSCULAR | Status: DC | PRN
  Administered 2016-02-03: 4 mg via INTRAVENOUS

## 2016-02-03 MED ORDER — BUPIVACAINE HCL (PF) 0.5 % IJ SOLN
INTRAMUSCULAR | Status: AC
  Filled 2016-02-03: qty 30

## 2016-02-03 MED ORDER — CEFAZOLIN SODIUM-DEXTROSE 2-4 GM/100ML-% IV SOLN
INTRAVENOUS | Status: AC
  Administered 2016-02-03: 2 g via INTRAVENOUS
  Filled 2016-02-03: qty 100

## 2016-02-03 MED ORDER — MEPERIDINE HCL 25 MG/ML IJ SOLN: 6.2500 mg | INTRAMUSCULAR | Status: DC | PRN

## 2016-02-03 MED ORDER — FENTANYL CITRATE (PF) 100 MCG/2ML IJ SOLN
INTRAMUSCULAR | Status: DC | PRN
  Administered 2016-02-03: 50 ug via INTRAVENOUS

## 2016-02-03 MED ORDER — BUPIVACAINE HCL (PF) 0.5 % IJ SOLN
INTRAMUSCULAR | Status: DC | PRN
  Administered 2016-02-03: 20 mL

## 2016-02-03 MED ORDER — FENTANYL CITRATE (PF) 100 MCG/2ML IJ SOLN: 25.0000 ug | INTRAMUSCULAR | Status: DC | PRN

## 2016-02-03 SURGICAL SUPPLY — 57 items
APPLIER CLIP 11 MED OPEN (CLIP)
APPLIER CLIP 9.375 SM OPEN (CLIP)
BAG COUNTER SPONGE EZ (MISCELLANEOUS) IMPLANT
BAG DECANTER FOR FLEXI CONT (MISCELLANEOUS) ×3 IMPLANT
BLADE SURG SZ11 CARB STEEL (BLADE) ×3 IMPLANT
BOOT SUTURE AID YELLOW STND (SUTURE) ×3 IMPLANT
BRUSH SCRUB 4% CHG (MISCELLANEOUS) ×3 IMPLANT
CANISTER SUCT 1200ML W/VALVE (MISCELLANEOUS) ×3 IMPLANT
CHLORAPREP W/TINT 26ML (MISCELLANEOUS) ×3 IMPLANT
CLIP APPLIE 11 MED OPEN (CLIP) IMPLANT
CLIP APPLIE 9.375 SM OPEN (CLIP) IMPLANT
COUNTER SPONGE BAG EZ (MISCELLANEOUS)
DERMABOND ADVANCED (GAUZE/BANDAGES/DRESSINGS) ×2
DERMABOND ADVANCED .7 DNX12 (GAUZE/BANDAGES/DRESSINGS) ×1 IMPLANT
DRESSING SURGICEL FIBRLLR 1X2 (HEMOSTASIS) ×1 IMPLANT
DRSG SURGICEL FIBRILLAR 1X2 (HEMOSTASIS) ×3
ELECT CAUTERY BLADE 6.4 (BLADE) ×3 IMPLANT
ELECT REM PT RETURN 9FT ADLT (ELECTROSURGICAL) ×3
ELECTRODE REM PT RTRN 9FT ADLT (ELECTROSURGICAL) ×1 IMPLANT
GLOVE BIO SURGEON STRL SZ7 (GLOVE) ×9 IMPLANT
GLOVE INDICATOR 7.5 STRL GRN (GLOVE) ×6 IMPLANT
GLOVE SURG SYN 8.0 (GLOVE) ×3 IMPLANT
GOWN STRL REUS W/ TWL LRG LVL3 (GOWN DISPOSABLE) ×2 IMPLANT
GOWN STRL REUS W/ TWL XL LVL3 (GOWN DISPOSABLE) ×1 IMPLANT
GOWN STRL REUS W/TWL LRG LVL3 (GOWN DISPOSABLE) ×4
GOWN STRL REUS W/TWL XL LVL3 (GOWN DISPOSABLE) ×2
GRAFT PROPATEN STD WALL 4 7X45 (Vascular Products) ×3 IMPLANT
IV NS 500ML (IV SOLUTION) ×2
IV NS 500ML BAXH (IV SOLUTION) ×1 IMPLANT
KIT RM TURNOVER STRD PROC AR (KITS) ×3 IMPLANT
LABEL OR SOLS (LABEL) ×3 IMPLANT
LOOP RED MAXI  1X406MM (MISCELLANEOUS) ×2
LOOP VESSEL MAXI 1X406 RED (MISCELLANEOUS) ×1 IMPLANT
LOOP VESSEL MINI 0.8X406 BLUE (MISCELLANEOUS) ×2 IMPLANT
LOOPS BLUE MINI 0.8X406MM (MISCELLANEOUS) ×4
NEEDLE FILTER BLUNT 18X 1/2SAF (NEEDLE) ×2
NEEDLE FILTER BLUNT 18X1 1/2 (NEEDLE) ×1 IMPLANT
NS IRRIG 500ML POUR BTL (IV SOLUTION) ×3 IMPLANT
PACK EXTREMITY ARMC (MISCELLANEOUS) ×3 IMPLANT
PAD PREP 24X41 OB/GYN DISP (PERSONAL CARE ITEMS) ×3 IMPLANT
PUNCH SURGICAL ROTATE 2.7MM (MISCELLANEOUS) IMPLANT
STOCKINETTE STRL 4IN 9604848 (GAUZE/BANDAGES/DRESSINGS) ×3 IMPLANT
SUT GTX CV-6 30 (SUTURE) ×6 IMPLANT
SUT MNCRL+ 5-0 UNDYED PC-3 (SUTURE) ×1 IMPLANT
SUT MONOCRYL 5-0 (SUTURE) ×2
SUT PROLENE 6 0 BV (SUTURE) ×12 IMPLANT
SUT SILK 2 0 (SUTURE) ×2
SUT SILK 2 0 SH (SUTURE) ×3 IMPLANT
SUT SILK 2-0 18XBRD TIE 12 (SUTURE) ×1 IMPLANT
SUT SILK 3 0 (SUTURE) ×2
SUT SILK 3-0 18XBRD TIE 12 (SUTURE) ×1 IMPLANT
SUT SILK 4 0 (SUTURE) ×2
SUT SILK 4-0 18XBRD TIE 12 (SUTURE) ×1 IMPLANT
SUT VIC AB 3-0 SH 27 (SUTURE) ×4
SUT VIC AB 3-0 SH 27X BRD (SUTURE) ×2 IMPLANT
SYR 20CC LL (SYRINGE) ×3 IMPLANT
SYR 3ML LL SCALE MARK (SYRINGE) ×3 IMPLANT

## 2016-02-03 NOTE — Discharge Instructions (Signed)
AMBULATORY SURGERY  °DISCHARGE INSTRUCTIONS ° ° °1) The drugs that you were given will stay in your system until tomorrow so for the next 24 hours you should not: ° °A) Drive an automobile °B) Make any legal decisions °C) Drink any alcoholic beverage ° ° °2) You may resume regular meals tomorrow.  Today it is better to start with liquids and gradually work up to solid foods. ° °You may eat anything you prefer, but it is better to start with liquids, then soup and crackers, and gradually work up to solid foods. ° ° °3) Please notify your doctor immediately if you have any unusual bleeding, trouble breathing, redness and pain at the surgery site, drainage, fever, or pain not relieved by medication. ° ° ° °4) Additional Instructions: ° ° ° ° ° ° ° °Please contact your physician with any problems or Same Day Surgery at [phone], Monday through Friday 6 am to 4 pm, or [place] at [place] Main number at [phone]. °

## 2016-02-03 NOTE — Anesthesia Preprocedure Evaluation (Signed)
Anesthesia Evaluation  Patient identified by MRN, date of birth, ID band Patient awake    Reviewed: Allergy & Precautions, NPO status , Patient's Chart, lab work & pertinent test results  History of Anesthesia Complications Negative for: history of anesthetic complications  Airway Mallampati: II  TM Distance: >3 FB Neck ROM: Full    Dental  (+) Missing, Poor Dentition   Pulmonary neg pulmonary ROS, neg sleep apnea, neg COPD,    breath sounds clear to auscultation- rhonchi (-) wheezing      Cardiovascular hypertension, + CAD, + Past MI, + CABG and +CHF (diastolic, preserved EF)   Rhythm:Regular Rate:Normal - Systolic murmurs and - Diastolic murmurs    Neuro/Psych Depression negative neurological ROS     GI/Hepatic Neg liver ROS, GERD  ,  Endo/Other  diabetes, Insulin DependentHypothyroidism   Renal/GU ESRF and DialysisRenal disease (last dialysis yesterday 02/02/16)     Musculoskeletal   Abdominal (+) + obese,   Peds  Hematology  (+) anemia ,   Anesthesia Other Findings Past Medical History: No date: Anemia No date: Chronic diastolic CHF (congestive heart failur*     Comment: a. Due to ischemic cardiomyopathy. EF as low               as 35%, improved to normal s/p CABG; b. echo               07/06/13: EF 55-60%, no RWMAs, mod TR, trivial               pericardial effusion not c/w tamponade               physiology;  c. 10/2015 Echo: EF 65%, Gr1 DD,               triv AI, mild MR, mildly dil LA, mod TR, PASP               81mmHg. No date: Coronary artery disease     Comment: a. NSTEMI 06/2013; b.cath: severe three-vessel               CAD w/ EF 30% & mild-mod MR; c. s/p 3 vessel               CABG 07/02/13 (LIMA-LAD, SVG-OM, and SVG-RPDA);               d. 10/2015 MV: no ischemia/infarct. No date: Diabetes mellitus without complication (HCC) No date: Diabetic neuropathy ([place]) No date: Dialysis patient [facility])  Comment: Tues, Thurs, Sat No date: ESRD (end stage renal disease) ([place])     Comment: a. 12/2015 initiated TTS dialysis. No date: GERD (gastroesophageal reflux disease) No date: Hyperlipidemia No date: Hypertension No date: Hypothyroidism 2015: Myocardial infarction No date: Neuropathy ([place]) 2015: Pleural effusion No date: Pulmonary hypertension No date: Wears dentures     Comment: full lower   Reproductive/Obstetrics                             Anesthesia Physical Anesthesia Plan  ASA: IV  Anesthesia Plan: General   Post-op Pain Management:    Induction: Intravenous  Airway Management Planned: LMA  Additional Equipment:   Intra-op Plan:   Post-operative Plan:   Informed Consent: I have reviewed the patients History and Physical, chart, labs and discussed the procedure including the risks, benefits and alternatives for the proposed anesthesia with the patient or authorized representative who has indicated his/her understanding and acceptance.  Dental advisory given  Plan Discussed with: Anesthesiologist and CRNA  Anesthesia Plan Comments:         Anesthesia Quick Evaluation

## 2016-02-03 NOTE — Transfer of Care (Signed)
Immediate Anesthesia Transfer of Care Note  Patient: Jamie Arias  Procedure(s) Performed: Procedure(s): INSERTION OF ARTERIOVENOUS (AV) GORE-TEX GRAFT ARM ( BRACH / AXILLARY ) (Right)  Patient Location: PACU  Anesthesia Type:General  Level of Consciousness: awake and alert   Airway & Oxygen Therapy: Patient Spontanous Breathing and Patient connected to face mask oxygen  Post-op Assessment: Report given to RN and Post -op Vital signs reviewed and stable  Post vital signs: Reviewed and stable  Last Vitals:  Vitals:   02/03/16 0923 02/03/16 1313  BP: (!) 136/56 (!) 127/57  Pulse: (!) 57 (!) 56  Resp: 16 14  Temp: 36.7 C 36.1 C    Last Pain:  Vitals:   02/03/16 0923  TempSrc: Oral         Complications: No apparent anesthesia complications

## 2016-02-03 NOTE — H&P (Signed)
[facility] History & Physical Update  The patient was interviewed and re-examined.  The patient's previous History and Physical has been reviewed and is unchanged.  There is no change in the plan of care. We plan to proceed with the scheduled procedure.  Hortencia Pilar, MD  02/03/2016, 9:56 AM

## 2016-02-03 NOTE — Anesthesia Postprocedure Evaluation (Signed)
Anesthesia Post Note  Patient: Jamie Arias  Procedure(s) Performed: Procedure(s) (LRB): INSERTION OF ARTERIOVENOUS (AV) GORE-TEX GRAFT ARM ( BRACH / AXILLARY ) (Right)  Patient location during evaluation: PACU Anesthesia Type: General Level of consciousness: awake and alert Pain management: pain level controlled Vital Signs Assessment: post-procedure vital signs reviewed and stable Respiratory status: spontaneous breathing, nonlabored ventilation, respiratory function stable and patient connected to nasal cannula oxygen Cardiovascular status: blood pressure returned to baseline and stable Postop Assessment: no signs of nausea or vomiting Anesthetic complications: no    Last Vitals:  Vitals:   02/03/16 1415 02/03/16 1440  BP: (!) 155/62 135/61  Pulse: (!) 59 60  Resp: 14 16  Temp: 36.6 C     Last Pain:  Vitals:   02/03/16 0923  TempSrc: Oral                 Molli Barrows

## 2016-02-03 NOTE — Op Note (Signed)
OPERATIVE NOTE   PROCEDURE: right brachial axillary arteriovenous graft placement  PRE-OPERATIVE DIAGNOSIS: End Stage Renal Disease  POST-OPERATIVE DIAGNOSIS: End Stage Renal Disease  SURGEON: Hortencia Pilar  ASSISTANT(S): Ms. Hezzie Bump  ANESTHESIA: general  ESTIMATED BLOOD LOSS: <50 cc  FINDING(S): 4 mm brachial artery with a 10 mm axillary vein  SPECIMEN(S):  none  INDICATIONS:   Jamie Arias is a 60 y.o. female who presents with end stage renal disease.  The patient is scheduled for right brachial axillary AV graft placement.  The patient is aware the risks include but are not limited to: bleeding, infection, steal syndrome, nerve damage, ischemic monomelic neuropathy, failure to mature, and need for additional procedures.  The patient is aware of the risks of the procedure and elects to proceed forward.  DESCRIPTION: After full informed written consent was obtained from the patient, the patient was brought back to the operating room and placed supine upon the operating table.  Prior to induction, the patient received IV antibiotics.   After obtaining adequate anesthesia, the patient was then prepped and draped in the standard fashion for a right arm access procedure.    A linear incision was then created along the medial border of the biceps muscle just proximal to the antecubital crease and the brachial artery which was exposed through. The brachial artery was then looped proximally and distally with Silastic Vesseloops. Side branches were controlled with 4-0 silk ties.  Attention was then turned to the exposure of the axillary vein. Linear incision was then created medial to the proximal portion of the biceps at the level of the anterior axillary crease. The axillary vein was exposed and again looped proximally and distally with Silastic vessel loops. Associated tributaries were also controlled with Silastic Vesseloops.  The Gore tunneler was then delivered onto  the field and a subcutaneous path was made from the arterial incision to the venous incision. A 4-7 tapered PTFE propatent graft by Simeon Craft was then pulled through the subcutaneous tunnel. The arterial 4 mm portion was then approximated to the brachial artery. Brachial artery was controlled proximally and distally with the Silastic Vesseloops. Arteriotomy was made with an 11 blade scalpel and extended with Potts scissors and a 6-0 Prolene stay suture was placed. End graft to side brachial artery anastomosis was then fashioned with running CV 6 suture. Flushing maneuvers were performed suture line was hemostatic and the graft was then assessed for proper position and ease of future cannulation. Heparinized saline was infused into the vein and the graft was clamped with a vascular clamp. With the graft pressurized it was approximated to the axillary vein in its native bed and then marked with a surgical marker. The vein was then delivered into the surgical field and controlled with the Silastic vessel loops. Venotomy was then made with an 11 blade scalpel and extended with Potts scissors and a 6-0 Prolene suture was used as stay suture. The the graft was then sewn to the vein in an end graft to side vein fashion using running CV 6 suture.  Flushing maneuvers were performed and the artery was allowed to forward and back bleed.  Flow was then established through the AV graft  There was good  thrill in the venous outflow, and there was 1+ palpable radial pulse.  At this point, I irrigated out the surgical wounds.  There was no further active bleeding.  The subcutaneous tissue was reapproximated with a running stitch of 3-0 Vicryl.  The  skin was then reapproximated with a running subcuticular stitch of 4-0 Vicryl.  The skin was then cleaned, dried, and reinforced with Dermabond.    The patient tolerated this procedure well.   COMPLICATIONS: None  CONDITION: Margaretmary D[facility]   Office: [phone]   02/03/2016, 12:56 PM

## 2016-02-03 NOTE — Anesthesia Procedure Notes (Signed)
Procedure Name: LMA Insertion Date/Time: 02/03/2016 11:06 AM Performed by: Hedda Slade Pre-anesthesia Checklist: Patient identified, Emergency Drugs available, Suction available and Patient being monitored Patient Re-evaluated:Patient Re-evaluated prior to inductionOxygen Delivery Method: Circle system utilized Preoxygenation: Pre-oxygenation with 100% oxygen Intubation Type: IV induction Ventilation: Mask ventilation without difficulty LMA: LMA inserted LMA Size: 4.0 Number of attempts: 2 (significant leak with 3.5) Placement Confirmation: positive ETCO2 and breath sounds checked- equal and bilateral Tube secured with: Tape Dental Injury: Teeth and Oropharynx as per pre-operative assessment

## 2016-02-16 ENCOUNTER — Encounter (INDEPENDENT_AMBULATORY_CARE_PROVIDER_SITE_OTHER): Payer: Self-pay | Admitting: Vascular Surgery

## 2016-02-16 ENCOUNTER — Ambulatory Visit (INDEPENDENT_AMBULATORY_CARE_PROVIDER_SITE_OTHER): Payer: Self-pay | Admitting: Vascular Surgery

## 2016-02-16 VITALS — BP 161/80 | HR 63 | Resp 16 | Ht 64.0 in | Wt 184.0 lb

## 2016-02-16 DIAGNOSIS — I10 Essential (primary) hypertension: Secondary | ICD-10-CM

## 2016-02-16 DIAGNOSIS — Z992 Dependence on renal dialysis: Secondary | ICD-10-CM

## 2016-02-16 DIAGNOSIS — N186 End stage renal disease: Secondary | ICD-10-CM

## 2016-02-16 DIAGNOSIS — E785 Hyperlipidemia, unspecified: Secondary | ICD-10-CM

## 2016-02-16 NOTE — Progress Notes (Signed)
Subjective:    Patient ID: GIRL SCHISSLER, female    DOB: 1955/04/23, 60 y.o.   MRN: [id number] Chief Complaint  Patient presents with  . Routine Post Op    2 week [facility] follow up   Patient presents for her first post-operative follow up. She is s/p a right brachial axillary arteriovenous graft placement on 02/03/16. Her post-operative course has been uneventful. She is without complaint. Currently being maintained a right permcath without issue. The patient denies any fistula skin breakdown, pain, edema, pallor or ulceration of the arm / hand.   Review of Systems  Constitutional: Negative.   HENT: Negative.   Eyes: Negative.   Respiratory: Negative.   Cardiovascular: Negative.   Gastrointestinal: Negative.   Endocrine: Negative.   Genitourinary:       ESRD  Musculoskeletal: Negative.   Skin: Negative.   Allergic/Immunologic: Negative.   Neurological: Negative.   Hematological: Negative.   Psychiatric/Behavioral: Negative.       Objective:   Physical Exam  Constitutional: She is oriented to person, place, and time. She appears well-developed and well-nourished.  HENT:  Head: Normocephalic and atraumatic.  Right Ear: External ear normal.  Left Ear: External ear normal.  Eyes: Conjunctivae and EOM are normal. Pupils are equal, round, and reactive to light.  Neck: Normal range of motion.  Cardiovascular: Normal rate, regular rhythm, normal heart sounds and intact distal pulses.   Pulses:      Radial pulses are 2+ on the right side, and 2+ on the left side.       Dorsalis pedis pulses are 2+ on the right side, and 2+ on the left side.       Posterior tibial pulses are 2+ on the right side, and 2+ on the left side.  Right Upper Extremity Access: Good thrill and bruit  Pulmonary/Chest: Effort normal and breath sounds normal.  Abdominal: Soft. Bowel sounds are normal.  Musculoskeletal: Normal range of motion. She exhibits no edema.  Neurological: She is alert and oriented to  person, place, and time.  Skin: Skin is warm and dry.  Incision healing well.  Psychiatric: She has a normal mood and affect. Her behavior is normal. Judgment and thought content normal.   BP (!) 161/80 (BP Location: Right Arm)   Pulse 63   Resp 16   Ht 5' 4" (1.626 m)   Wt 184 lb (83.5 kg)   BMI 31.58 kg/m   Past Medical History:  Diagnosis Date  . Anemia   . Chronic diastolic CHF (congestive heart failure) ([place])    a. Due to ischemic cardiomyopathy. EF as low as 35%, improved to normal s/p CABG; b. echo 07/06/13: EF 55-60%, no RWMAs, mod TR, trivial pericardial effusion not c/w tamponade physiology;  c. 10/2015 Echo: EF 65%, Gr1 DD, triv AI, mild MR, mildly dil LA, mod TR, PASP 39mHg.  .Marland KitchenCoronary artery disease    a. NSTEMI 06/2013; b.cath: severe three-vessel CAD w/ EF 30% & mild-mod MR; c. s/p 3 vessel CABG 07/02/13 (LIMA-LAD, SVG-OM, and SVG-RPDA);  d. 10/2015 MV: no ischemia/infarct.  . Diabetes mellitus without complication ([place]   . Diabetic neuropathy ([place]   . Dialysis patient ([facility]    Tues, Thurs, Sat  . ESRD (end stage renal disease) ([place]    a. 12/2015 initiated TTS dialysis.  .Marland KitchenGERD (gastroesophageal reflux disease)   . Hyperlipidemia   . Hypertension   . Hypothyroidism   . Myocardial infarction 2015  . Neuropathy ([place]   .  Pleural effusion 2015  . Pulmonary hypertension   . Wears dentures    full lower   Social History   Social History  . Marital status: Married    Spouse name: N/A  . Number of children: N/A  . Years of education: N/A   Occupational History  . works for [employer] Topics  . Smoking status: Never Smoker  . Smokeless tobacco: Never Used  . Alcohol use No  . Drug use: No  . Sexual activity: Not on file   Other Topics Concern  . Not on file   Social History Narrative   Patient lives at home with her husband. Patient has 4 adult children.   Past Surgical History:  Procedure Laterality Date  . AV FISTULA  PLACEMENT Right 02/03/2016   Procedure: INSERTION OF ARTERIOVENOUS (AV) GORE-TEX GRAFT ARM ( BRACH / AXILLARY );  Surgeon: Katha Cabal, MD;  Location: [facility] ORS;  Service: Vascular;  Laterality: Right;  . CARDIAC CATHETERIZATION    . CATARACT EXTRACTION Bilateral   . CHOLECYSTECTOMY N/A 12/09/2014   Procedure: LAPAROSCOPIC CHOLECYSTECTOMY;  Surgeon: Marlyce [name], MD;  Location: [facility] ORS;  Service: General;  Laterality: N/A;  . CORONARY ARTERY BYPASS GRAFT N/A 07/02/2013   Procedure: CORONARY ARTERY BYPASS GRAFTING (CABG);  Surgeon: Ivin [name], MD;  Location: [place];  Service: Open Heart Surgery;  Laterality: N/A;  CABG x three, using left internal mammary artery and right leg greater saphenous vein harvested endoscopically  . ESOPHAGOGASTRODUODENOSCOPY (EGD) WITH PROPOFOL N/A 11/24/2015   Procedure: ESOPHAGOGASTRODUODENOSCOPY (EGD) WITH PROPOFOL;  Surgeon: [name], MD;  Location: [place];  Service: Endoscopy;  Laterality: N/A;  Diabetic - insulin  . EYE SURGERY Bilateral    Cataract Extraction with IOL  . INTRAOPERATIVE TRANSESOPHAGEAL ECHOCARDIOGRAM N/A 07/02/2013   Procedure: INTRAOPERATIVE TRANSESOPHAGEAL ECHOCARDIOGRAM;  Surgeon: Ivin [name], MD;  Location: [place];  Service: Open Heart Surgery;  Laterality: N/A;  . PERIPHERAL VASCULAR CATHETERIZATION Right 12/06/2015   Procedure: Dialysis/Perma Catheter Insertion;  Surgeon: Katha Cabal, MD;  Location: [place] [facility];  Service: Cardiovascular;  Laterality: Right;  . THORACENTESIS Left 2015   Family History  Problem Relation Age of Onset  . COPD Mother   . Cancer Mother     Lung  . Pulmonary embolism Father   . Diabetes Father   . Diabetes Paternal Grandfather   . Heart disease Maternal Grandmother   . Colon cancer Neg Hx   . Colon polyps Neg Hx   . Esophageal cancer Neg Hx   . Pancreatic cancer Neg Hx   . Liver disease Neg Hx    No Known Allergies     Assessment & Plan:  Patient  presents for her first post-operative follow up. She is s/p a right brachial axillary arteriovenous graft placement on 02/03/16. Her post-operative course has been uneventful. She is without complaint. Currently being maintained a right permcath without issue. The patient denies any fistula skin breakdown, pain, edema, pallor or ulceration of the arm / hand.  1. ESRD on dialysis [facility]) s/p right brachial axillary arteriovenous graft placement on 02/03/16 - [facility] well. Continue to dialyze through permcath as access matures for another two to three weeks  - VAS [place] (AVF,AVG); Future  2. Hyperlipidemia, unspecified hyperlipidemia type - Stable Encouraged good control as its slows the progression of atherosclerotic disease  3. Essential hypertension - Stable Encouraged good control as its slows the progression  of atherosclerotic disease  Current Outpatient Prescriptions on File Prior to Visit  Medication Sig Dispense Refill  . acetaminophen (TYLENOL) 325 MG tablet Take 650 mg by mouth every 6 (six) hours as needed (for pain.).    Marland Kitchen amLODipine (NORVASC) 2.5 MG tablet Take 1 tablet (2.5 mg total) by mouth daily. 90 tablet 3  . atorvastatin (LIPITOR) 80 MG tablet Take 1 tablet (80 mg total) by mouth daily. 90 tablet 3  . blood glucose meter kit and supplies KIT Dispense based on patient and insurance preference. Use up to twice daily. (FOR ICD-10 E11.9) 1 each 0  . carvedilol (COREG) 6.25 MG tablet Take 1 tablet (6.25 mg total) by mouth 2 (two) times daily. 180 tablet 3  . ferrous sulfate 325 (65 FE) MG tablet Take 325 mg by mouth daily with breakfast.    . furosemide (LASIX) 20 MG tablet Take 20 mg by mouth every other day. Non-dialysis days    . gabapentin (NEURONTIN) 300 MG capsule Take 1 capsule (300 mg total) by mouth 3 (three) times daily. 90 capsule 3  . glucose blood (RELION GLUCOSE TEST STRIPS) test strip Test blood glucose twice daily. Diagnosis code E11.9. 100  each 12  . hydrALAZINE (APRESOLINE) 25 MG tablet Take 1 tablet (25 mg total) by mouth 3 (three) times daily. 270 tablet 3  . Insulin Glargine (BASAGLAR KWIKPEN) 100 UNIT/ML SOPN Inject 0.05 mLs (5 Units total) into the skin at bedtime. 1 pen 0  . Insulin Pen Needle (GLOBAL EASE INJECT PEN NEEDLES) 31G X 5 MM MISC Inject 0.1 mLs (10 Units total) into the skin at bedtime 90 each 2  . isosorbide mononitrate (IMDUR) 30 MG 24 hr tablet Take 1 tablet (30 mg total) by mouth daily. 90 tablet 3  . levothyroxine (SYNTHROID, LEVOTHROID) 75 MCG tablet Take 1 tablet (75 mcg total) by mouth daily. 90 tablet 1  . lisinopril (PRINIVIL,ZESTRIL) 20 MG tablet Take 20 mg by mouth daily.    . metoCLOPramide (REGLAN) 5 MG tablet Take 1 tablet (5 mg total) by mouth every 8 (eight) hours as needed for nausea. 30 tablet 1  . nitroGLYCERIN (NITROSTAT) 0.4 MG SL tablet DISSOLVE ONE TABLET UNDER THE TONGUE EVERY 5 MINUTES AS NEEDED FOR CHEST PAIN.  DO NOT EXCEED A TOTAL OF 3 DOSES IN 15 MINUTES 25 tablet 0  . omeprazole (PRILOSEC) 40 MG capsule Take 1 capsule (40 mg total) by mouth 2 (two) times daily before a meal. 60 capsule 11  . ondansetron (ZOFRAN-ODT) 8 MG disintegrating tablet DISSOLVE ONE TABLET IN MOUTH EVERY 8 HOURS AS NEEDED FOR NAUSEA AND VOMITING 10 tablet 0  . oxyCODONE-acetaminophen (ROXICET) 5-325 MG tablet Take 1-2 tablets by mouth every 6 (six) hours as needed for moderate pain or severe pain. 60 tablet 0  . promethazine (PHENERGAN) 12.5 MG tablet Take 12.5 mg by mouth every 6 (six) hours as needed for nausea or vomiting.    . traZODone (DESYREL) 50 MG tablet TAKE ONE TABLET BY MOUTH AT BEDTIME AS NEEDED FOR SLEEP 30 tablet 3  . Vitamin D, Ergocalciferol, (DRISDOL) 50000 units CAPS capsule Take 50,000 Units by mouth every Sunday.      No current facility-administered medications on file prior to visit.     There are no Patient Instructions on file for this visit. Return in about 3 weeks (around 03/08/2016)  for HDA.   Estela Vinal A [place], PA-C

## 2016-02-20 ENCOUNTER — Ambulatory Visit: Payer: Self-pay | Admitting: Family Medicine

## 2016-03-06 DIAGNOSIS — N2581 Secondary hyperparathyroidism of renal origin: Secondary | ICD-10-CM | POA: Diagnosis not present

## 2016-03-06 DIAGNOSIS — N186 End stage renal disease: Secondary | ICD-10-CM | POA: Diagnosis not present

## 2016-03-06 DIAGNOSIS — D509 Iron deficiency anemia, unspecified: Secondary | ICD-10-CM | POA: Diagnosis not present

## 2016-03-06 DIAGNOSIS — E1122 Type 2 diabetes mellitus with diabetic chronic kidney disease: Secondary | ICD-10-CM | POA: Diagnosis not present

## 2016-03-06 DIAGNOSIS — D631 Anemia in chronic kidney disease: Secondary | ICD-10-CM | POA: Diagnosis not present

## 2016-03-10 DIAGNOSIS — E1122 Type 2 diabetes mellitus with diabetic chronic kidney disease: Secondary | ICD-10-CM | POA: Diagnosis not present

## 2016-03-10 DIAGNOSIS — D631 Anemia in chronic kidney disease: Secondary | ICD-10-CM | POA: Diagnosis not present

## 2016-03-10 DIAGNOSIS — N186 End stage renal disease: Secondary | ICD-10-CM | POA: Diagnosis not present

## 2016-03-10 DIAGNOSIS — N2581 Secondary hyperparathyroidism of renal origin: Secondary | ICD-10-CM | POA: Diagnosis not present

## 2016-03-10 DIAGNOSIS — D509 Iron deficiency anemia, unspecified: Secondary | ICD-10-CM | POA: Diagnosis not present

## 2016-03-13 DIAGNOSIS — N186 End stage renal disease: Secondary | ICD-10-CM | POA: Diagnosis not present

## 2016-03-13 DIAGNOSIS — D509 Iron deficiency anemia, unspecified: Secondary | ICD-10-CM | POA: Diagnosis not present

## 2016-03-13 DIAGNOSIS — E1122 Type 2 diabetes mellitus with diabetic chronic kidney disease: Secondary | ICD-10-CM | POA: Diagnosis not present

## 2016-03-13 DIAGNOSIS — D631 Anemia in chronic kidney disease: Secondary | ICD-10-CM | POA: Diagnosis not present

## 2016-03-13 DIAGNOSIS — N2581 Secondary hyperparathyroidism of renal origin: Secondary | ICD-10-CM | POA: Diagnosis not present

## 2016-03-15 DIAGNOSIS — D631 Anemia in chronic kidney disease: Secondary | ICD-10-CM | POA: Diagnosis not present

## 2016-03-15 DIAGNOSIS — N186 End stage renal disease: Secondary | ICD-10-CM | POA: Diagnosis not present

## 2016-03-15 DIAGNOSIS — E1122 Type 2 diabetes mellitus with diabetic chronic kidney disease: Secondary | ICD-10-CM | POA: Diagnosis not present

## 2016-03-15 DIAGNOSIS — D509 Iron deficiency anemia, unspecified: Secondary | ICD-10-CM | POA: Diagnosis not present

## 2016-03-15 DIAGNOSIS — N2581 Secondary hyperparathyroidism of renal origin: Secondary | ICD-10-CM | POA: Diagnosis not present

## 2016-03-22 DIAGNOSIS — E1122 Type 2 diabetes mellitus with diabetic chronic kidney disease: Secondary | ICD-10-CM | POA: Diagnosis not present

## 2016-03-22 DIAGNOSIS — N2581 Secondary hyperparathyroidism of renal origin: Secondary | ICD-10-CM | POA: Diagnosis not present

## 2016-03-22 DIAGNOSIS — N186 End stage renal disease: Secondary | ICD-10-CM | POA: Diagnosis not present

## 2016-03-22 DIAGNOSIS — D509 Iron deficiency anemia, unspecified: Secondary | ICD-10-CM | POA: Diagnosis not present

## 2016-03-22 DIAGNOSIS — D631 Anemia in chronic kidney disease: Secondary | ICD-10-CM | POA: Diagnosis not present

## 2016-03-24 DIAGNOSIS — D509 Iron deficiency anemia, unspecified: Secondary | ICD-10-CM | POA: Diagnosis not present

## 2016-03-24 DIAGNOSIS — D631 Anemia in chronic kidney disease: Secondary | ICD-10-CM | POA: Diagnosis not present

## 2016-03-24 DIAGNOSIS — E1122 Type 2 diabetes mellitus with diabetic chronic kidney disease: Secondary | ICD-10-CM | POA: Diagnosis not present

## 2016-03-24 DIAGNOSIS — N2581 Secondary hyperparathyroidism of renal origin: Secondary | ICD-10-CM | POA: Diagnosis not present

## 2016-03-24 DIAGNOSIS — N186 End stage renal disease: Secondary | ICD-10-CM | POA: Diagnosis not present

## 2016-03-27 DIAGNOSIS — D631 Anemia in chronic kidney disease: Secondary | ICD-10-CM | POA: Diagnosis not present

## 2016-03-27 DIAGNOSIS — N2581 Secondary hyperparathyroidism of renal origin: Secondary | ICD-10-CM | POA: Diagnosis not present

## 2016-03-27 DIAGNOSIS — N186 End stage renal disease: Secondary | ICD-10-CM | POA: Diagnosis not present

## 2016-03-27 DIAGNOSIS — D509 Iron deficiency anemia, unspecified: Secondary | ICD-10-CM | POA: Diagnosis not present

## 2016-03-27 DIAGNOSIS — E1122 Type 2 diabetes mellitus with diabetic chronic kidney disease: Secondary | ICD-10-CM | POA: Diagnosis not present

## 2016-03-29 DIAGNOSIS — N2581 Secondary hyperparathyroidism of renal origin: Secondary | ICD-10-CM | POA: Diagnosis not present

## 2016-03-29 DIAGNOSIS — N186 End stage renal disease: Secondary | ICD-10-CM | POA: Diagnosis not present

## 2016-03-29 DIAGNOSIS — D509 Iron deficiency anemia, unspecified: Secondary | ICD-10-CM | POA: Diagnosis not present

## 2016-03-29 DIAGNOSIS — E1122 Type 2 diabetes mellitus with diabetic chronic kidney disease: Secondary | ICD-10-CM | POA: Diagnosis not present

## 2016-03-29 DIAGNOSIS — D631 Anemia in chronic kidney disease: Secondary | ICD-10-CM | POA: Diagnosis not present

## 2016-03-31 DIAGNOSIS — D509 Iron deficiency anemia, unspecified: Secondary | ICD-10-CM | POA: Diagnosis not present

## 2016-03-31 DIAGNOSIS — D631 Anemia in chronic kidney disease: Secondary | ICD-10-CM | POA: Diagnosis not present

## 2016-03-31 DIAGNOSIS — E1122 Type 2 diabetes mellitus with diabetic chronic kidney disease: Secondary | ICD-10-CM | POA: Diagnosis not present

## 2016-03-31 DIAGNOSIS — N2581 Secondary hyperparathyroidism of renal origin: Secondary | ICD-10-CM | POA: Diagnosis not present

## 2016-03-31 DIAGNOSIS — N186 End stage renal disease: Secondary | ICD-10-CM | POA: Diagnosis not present

## 2016-04-02 DIAGNOSIS — D631 Anemia in chronic kidney disease: Secondary | ICD-10-CM | POA: Diagnosis not present

## 2016-04-02 DIAGNOSIS — N2581 Secondary hyperparathyroidism of renal origin: Secondary | ICD-10-CM | POA: Diagnosis not present

## 2016-04-02 DIAGNOSIS — N186 End stage renal disease: Secondary | ICD-10-CM | POA: Diagnosis not present

## 2016-04-02 DIAGNOSIS — E1122 Type 2 diabetes mellitus with diabetic chronic kidney disease: Secondary | ICD-10-CM | POA: Diagnosis not present

## 2016-04-02 DIAGNOSIS — D509 Iron deficiency anemia, unspecified: Secondary | ICD-10-CM | POA: Diagnosis not present

## 2016-04-04 DIAGNOSIS — D631 Anemia in chronic kidney disease: Secondary | ICD-10-CM | POA: Diagnosis not present

## 2016-04-04 DIAGNOSIS — E1122 Type 2 diabetes mellitus with diabetic chronic kidney disease: Secondary | ICD-10-CM | POA: Diagnosis not present

## 2016-04-04 DIAGNOSIS — D509 Iron deficiency anemia, unspecified: Secondary | ICD-10-CM | POA: Diagnosis not present

## 2016-04-04 DIAGNOSIS — N186 End stage renal disease: Secondary | ICD-10-CM | POA: Diagnosis not present

## 2016-04-04 DIAGNOSIS — Z992 Dependence on renal dialysis: Secondary | ICD-10-CM | POA: Diagnosis not present

## 2016-04-04 DIAGNOSIS — N2581 Secondary hyperparathyroidism of renal origin: Secondary | ICD-10-CM | POA: Diagnosis not present

## 2016-04-05 DIAGNOSIS — N186 End stage renal disease: Secondary | ICD-10-CM | POA: Diagnosis not present

## 2016-04-05 DIAGNOSIS — I120 Hypertensive chronic kidney disease with stage 5 chronic kidney disease or end stage renal disease: Secondary | ICD-10-CM | POA: Diagnosis not present

## 2016-04-05 DIAGNOSIS — E113511 Type 2 diabetes mellitus with proliferative diabetic retinopathy with macular edema, right eye: Secondary | ICD-10-CM | POA: Diagnosis not present

## 2016-04-05 DIAGNOSIS — Z992 Dependence on renal dialysis: Secondary | ICD-10-CM | POA: Diagnosis not present

## 2016-04-05 DIAGNOSIS — H4311 Vitreous hemorrhage, right eye: Secondary | ICD-10-CM | POA: Diagnosis not present

## 2016-04-07 DIAGNOSIS — D631 Anemia in chronic kidney disease: Secondary | ICD-10-CM | POA: Diagnosis not present

## 2016-04-07 DIAGNOSIS — D509 Iron deficiency anemia, unspecified: Secondary | ICD-10-CM | POA: Diagnosis not present

## 2016-04-07 DIAGNOSIS — E1122 Type 2 diabetes mellitus with diabetic chronic kidney disease: Secondary | ICD-10-CM | POA: Diagnosis not present

## 2016-04-07 DIAGNOSIS — N186 End stage renal disease: Secondary | ICD-10-CM | POA: Diagnosis not present

## 2016-04-07 DIAGNOSIS — N2581 Secondary hyperparathyroidism of renal origin: Secondary | ICD-10-CM | POA: Diagnosis not present

## 2016-04-10 DIAGNOSIS — N186 End stage renal disease: Secondary | ICD-10-CM | POA: Diagnosis not present

## 2016-04-10 DIAGNOSIS — D631 Anemia in chronic kidney disease: Secondary | ICD-10-CM | POA: Diagnosis not present

## 2016-04-10 DIAGNOSIS — E1122 Type 2 diabetes mellitus with diabetic chronic kidney disease: Secondary | ICD-10-CM | POA: Diagnosis not present

## 2016-04-10 DIAGNOSIS — D509 Iron deficiency anemia, unspecified: Secondary | ICD-10-CM | POA: Diagnosis not present

## 2016-04-10 DIAGNOSIS — N2581 Secondary hyperparathyroidism of renal origin: Secondary | ICD-10-CM | POA: Diagnosis not present

## 2016-04-12 DIAGNOSIS — D631 Anemia in chronic kidney disease: Secondary | ICD-10-CM | POA: Diagnosis not present

## 2016-04-12 DIAGNOSIS — E1122 Type 2 diabetes mellitus with diabetic chronic kidney disease: Secondary | ICD-10-CM | POA: Diagnosis not present

## 2016-04-12 DIAGNOSIS — D509 Iron deficiency anemia, unspecified: Secondary | ICD-10-CM | POA: Diagnosis not present

## 2016-04-12 DIAGNOSIS — N2581 Secondary hyperparathyroidism of renal origin: Secondary | ICD-10-CM | POA: Diagnosis not present

## 2016-04-12 DIAGNOSIS — N186 End stage renal disease: Secondary | ICD-10-CM | POA: Diagnosis not present

## 2016-04-14 DIAGNOSIS — N2581 Secondary hyperparathyroidism of renal origin: Secondary | ICD-10-CM | POA: Diagnosis not present

## 2016-04-14 DIAGNOSIS — D631 Anemia in chronic kidney disease: Secondary | ICD-10-CM | POA: Diagnosis not present

## 2016-04-14 DIAGNOSIS — D509 Iron deficiency anemia, unspecified: Secondary | ICD-10-CM | POA: Diagnosis not present

## 2016-04-14 DIAGNOSIS — E1122 Type 2 diabetes mellitus with diabetic chronic kidney disease: Secondary | ICD-10-CM | POA: Diagnosis not present

## 2016-04-14 DIAGNOSIS — N186 End stage renal disease: Secondary | ICD-10-CM | POA: Diagnosis not present

## 2016-04-17 ENCOUNTER — Encounter: Payer: Self-pay | Admitting: Vascular Surgery

## 2016-04-17 DIAGNOSIS — D509 Iron deficiency anemia, unspecified: Secondary | ICD-10-CM | POA: Diagnosis not present

## 2016-04-17 DIAGNOSIS — N186 End stage renal disease: Secondary | ICD-10-CM | POA: Diagnosis not present

## 2016-04-17 DIAGNOSIS — E1122 Type 2 diabetes mellitus with diabetic chronic kidney disease: Secondary | ICD-10-CM | POA: Diagnosis not present

## 2016-04-17 DIAGNOSIS — N2581 Secondary hyperparathyroidism of renal origin: Secondary | ICD-10-CM | POA: Diagnosis not present

## 2016-04-17 DIAGNOSIS — D631 Anemia in chronic kidney disease: Secondary | ICD-10-CM | POA: Diagnosis not present

## 2016-04-19 DIAGNOSIS — D509 Iron deficiency anemia, unspecified: Secondary | ICD-10-CM | POA: Diagnosis not present

## 2016-04-19 DIAGNOSIS — D631 Anemia in chronic kidney disease: Secondary | ICD-10-CM | POA: Diagnosis not present

## 2016-04-19 DIAGNOSIS — E1122 Type 2 diabetes mellitus with diabetic chronic kidney disease: Secondary | ICD-10-CM | POA: Diagnosis not present

## 2016-04-19 DIAGNOSIS — N2581 Secondary hyperparathyroidism of renal origin: Secondary | ICD-10-CM | POA: Diagnosis not present

## 2016-04-19 DIAGNOSIS — N186 End stage renal disease: Secondary | ICD-10-CM | POA: Diagnosis not present

## 2016-04-21 DIAGNOSIS — D631 Anemia in chronic kidney disease: Secondary | ICD-10-CM | POA: Diagnosis not present

## 2016-04-21 DIAGNOSIS — N186 End stage renal disease: Secondary | ICD-10-CM | POA: Diagnosis not present

## 2016-04-21 DIAGNOSIS — D509 Iron deficiency anemia, unspecified: Secondary | ICD-10-CM | POA: Diagnosis not present

## 2016-04-21 DIAGNOSIS — N2581 Secondary hyperparathyroidism of renal origin: Secondary | ICD-10-CM | POA: Diagnosis not present

## 2016-04-21 DIAGNOSIS — E1122 Type 2 diabetes mellitus with diabetic chronic kidney disease: Secondary | ICD-10-CM | POA: Diagnosis not present

## 2016-04-23 ENCOUNTER — Ambulatory Visit (INDEPENDENT_AMBULATORY_CARE_PROVIDER_SITE_OTHER): Payer: BLUE CROSS/BLUE SHIELD | Admitting: Family Medicine

## 2016-04-23 ENCOUNTER — Encounter: Payer: Self-pay | Admitting: Family Medicine

## 2016-04-23 DIAGNOSIS — G629 Polyneuropathy, unspecified: Secondary | ICD-10-CM | POA: Diagnosis not present

## 2016-04-23 DIAGNOSIS — I10 Essential (primary) hypertension: Secondary | ICD-10-CM

## 2016-04-23 DIAGNOSIS — E1140 Type 2 diabetes mellitus with diabetic neuropathy, unspecified: Secondary | ICD-10-CM

## 2016-04-23 DIAGNOSIS — N186 End stage renal disease: Secondary | ICD-10-CM | POA: Diagnosis not present

## 2016-04-23 DIAGNOSIS — Z992 Dependence on renal dialysis: Secondary | ICD-10-CM

## 2016-04-23 MED ORDER — BASAGLAR KWIKPEN 100 UNIT/ML ~~LOC~~ SOPN: 7.0000 [IU] | PEN_INJECTOR | Freq: Every day | SUBCUTANEOUS | 1 refills | Status: DC

## 2016-04-23 NOTE — Assessment & Plan Note (Signed)
Well-controlled. Continue current medications. Continue to monitor at dialysis.

## 2016-04-23 NOTE — Patient Instructions (Addendum)
Nice to see you. Please check with your nephrologist regarding the best option from their perspective for your neuropathy. Please continue to follow with ophthalmology as well. Please decrease your dose of gabapentin to 300 mg in the morning and 300 mg at night for 1 week, then decrease to 300 mg at night for 1 week, then decrease to 300 mg every other night for 1 week and then discontinue.

## 2016-04-23 NOTE — Progress Notes (Signed)
Pre visit review using our clinic review tool, if applicable. No additional management support is needed unless otherwise documented below in the visit note. 

## 2016-04-23 NOTE — Assessment & Plan Note (Signed)
Seems to be doing quite a bit better after starting dialysis. No longer has any nausea. She'll continue dialysis through nephrology

## 2016-04-23 NOTE — Assessment & Plan Note (Signed)
Seems to be well controlled. Continue insulin glargine 7 units daily. Continue to follow with ophthalmology for diabetic eye disease.

## 2016-04-23 NOTE — Assessment & Plan Note (Signed)
Continues to have issues with this. Renal dosing of gabapentin was reviewed and it appears the patient is on too high a dose given her ESRD. It is not working for her in any case. We will taper her off of this as outlined in the AVS. She will discuss the next best option with her nephrologist and will ask him about Lyrica. She will then contact us to let us know what they think.

## 2016-04-23 NOTE — Progress Notes (Signed)
[name], MD Phone: [phone]  Jamie Arias is a 61 y.o. female who presents today for f/u.  HYPERTENSION  Disease Monitoring  Home BP Monitoring up and down at dialysis Chest pain- no    Dyspnea- no Medications  Compliance-  Taking amlodipine, carvedilol, Lasix, hydralazine, lisinopril, and Imdur.  Edema- no  ESRD: Doing dialysis Tuesday Thursday Saturday. [facility]. Still urinating. They have not started using her fistula. She notes no recurrent nausea or vomiting since she started on dialysis.  Diabetes: Typically in the 120s to low 200s. A1c last month was 6.5 per her report. Typically using 7 units of insulin glargine. No polyuria. Rare hypoglycemia that resolves with eating something. She's been followed by ophthalmology in [facility] for diabetic eye disease. She's had a surgery on her right eye and also has had laser treatments. She notes her vision is improving progressively.  Notes continued issues with some neuropathy with burning particularly at night. She is taking gabapentin 300 mg in the morning and 600 mg at night. She notes this is not very beneficial.  PMH: nonsmoker.   ROS see history of present illness  Objective  Physical Exam Vitals:   04/23/16 1043  BP: 120/72  Pulse: 63  Temp: 98.4 F (36.9 C)    BP Readings from Last 3 Encounters:  04/23/16 120/72  02/16/16 (!) 161/80  02/03/16 135/61   Wt Readings from Last 3 Encounters:  04/23/16 185 lb 12.8 oz (84.3 kg)  02/16/16 184 lb (83.5 kg)  02/03/16 188 lb (85.3 kg)    Physical Exam  Constitutional: No distress.  Cardiovascular: Normal rate, regular rhythm and normal heart sounds.   AV fistula with good bruit  Pulmonary/Chest: Effort normal and breath sounds normal.  Musculoskeletal: She exhibits no edema.  Neurological: She is alert. Gait normal.  Skin: Skin is warm and dry. She is not diaphoretic.   Diabetic Foot Exam - Simple   Simple Foot Form Diabetic Foot exam was  performed with the following findings:  Yes 04/23/2016 11:15 AM  Visual Inspection No deformities, no ulcerations, no other skin breakdown bilaterally:  Yes Sensation Testing Pulse Check Posterior Tibialis and Dorsalis pulse intact bilaterally:  Yes Comments Decreased monofilament testing bilaterally, sensation intact to light touch     Assessment/Plan: Please see individual problem list.  HTN (hypertension) Well-controlled. Continue current medications. Continue to monitor at dialysis.  Diabetes mellitus, type 2 ([place]) Seems to be well controlled. Continue insulin glargine 7 units daily. Continue to follow with ophthalmology for diabetic eye disease.  Neuropathy Continues to have issues with this. Renal dosing of gabapentin was reviewed and it appears the patient is on too high a dose given her ESRD. It is not working for her in any case. We will taper her off of this as outlined in the AVS. She will discuss the next best option with her nephrologist and will ask him about Lyrica. She will then contact us to let us know what they think.  ESRD on dialysis [facility]) Seems to be doing quite a bit better after starting dialysis. No longer has any nausea. She'll continue dialysis through nephrology   No orders of the defined types were placed in this encounter.   Meds ordered this encounter  Medications  . Insulin Glargine (BASAGLAR KWIKPEN) 100 UNIT/ML SOPN    Sig: Inject 0.07 mLs (7 Units total) into the skin at bedtime.    Dispense:  5 pen    Refill:  1    Randall Hiss  Caryl Bis, MD [place]

## 2016-04-24 DIAGNOSIS — E1122 Type 2 diabetes mellitus with diabetic chronic kidney disease: Secondary | ICD-10-CM | POA: Diagnosis not present

## 2016-04-24 DIAGNOSIS — D631 Anemia in chronic kidney disease: Secondary | ICD-10-CM | POA: Diagnosis not present

## 2016-04-24 DIAGNOSIS — D509 Iron deficiency anemia, unspecified: Secondary | ICD-10-CM | POA: Diagnosis not present

## 2016-04-24 DIAGNOSIS — N186 End stage renal disease: Secondary | ICD-10-CM | POA: Diagnosis not present

## 2016-04-24 DIAGNOSIS — N2581 Secondary hyperparathyroidism of renal origin: Secondary | ICD-10-CM | POA: Diagnosis not present

## 2016-04-26 ENCOUNTER — Telehealth: Payer: Self-pay | Admitting: Family Medicine

## 2016-04-26 DIAGNOSIS — E1122 Type 2 diabetes mellitus with diabetic chronic kidney disease: Secondary | ICD-10-CM | POA: Diagnosis not present

## 2016-04-26 DIAGNOSIS — N2581 Secondary hyperparathyroidism of renal origin: Secondary | ICD-10-CM | POA: Diagnosis not present

## 2016-04-26 DIAGNOSIS — D631 Anemia in chronic kidney disease: Secondary | ICD-10-CM | POA: Diagnosis not present

## 2016-04-26 DIAGNOSIS — N186 End stage renal disease: Secondary | ICD-10-CM | POA: Diagnosis not present

## 2016-04-26 DIAGNOSIS — D509 Iron deficiency anemia, unspecified: Secondary | ICD-10-CM | POA: Diagnosis not present

## 2016-04-26 MED ORDER — LEVOTHYROXINE SODIUM 75 MCG PO TABS: 75.0000 ug | ORAL_TABLET | Freq: Every day | ORAL | 1 refills | Status: DC

## 2016-04-26 NOTE — Telephone Encounter (Signed)
Pt spouse called and stated that the pharmacy needs a new rx for levothyroxine (SYNTHROID, LEVOTHROID) 75 MCG tablet. Please call completed. Please advise, thank you!  Pharmacy - [facility] [place], [place] - [address] [place]  Call pt @ [phone]

## 2016-04-26 NOTE — Telephone Encounter (Signed)
Sent to pharmacy 

## 2016-04-28 DIAGNOSIS — E1122 Type 2 diabetes mellitus with diabetic chronic kidney disease: Secondary | ICD-10-CM | POA: Diagnosis not present

## 2016-04-28 DIAGNOSIS — D631 Anemia in chronic kidney disease: Secondary | ICD-10-CM | POA: Diagnosis not present

## 2016-04-28 DIAGNOSIS — D509 Iron deficiency anemia, unspecified: Secondary | ICD-10-CM | POA: Diagnosis not present

## 2016-04-28 DIAGNOSIS — N2581 Secondary hyperparathyroidism of renal origin: Secondary | ICD-10-CM | POA: Diagnosis not present

## 2016-04-28 DIAGNOSIS — N186 End stage renal disease: Secondary | ICD-10-CM | POA: Diagnosis not present

## 2016-05-01 DIAGNOSIS — D631 Anemia in chronic kidney disease: Secondary | ICD-10-CM | POA: Diagnosis not present

## 2016-05-01 DIAGNOSIS — E1122 Type 2 diabetes mellitus with diabetic chronic kidney disease: Secondary | ICD-10-CM | POA: Diagnosis not present

## 2016-05-01 DIAGNOSIS — D509 Iron deficiency anemia, unspecified: Secondary | ICD-10-CM | POA: Diagnosis not present

## 2016-05-01 DIAGNOSIS — N186 End stage renal disease: Secondary | ICD-10-CM | POA: Diagnosis not present

## 2016-05-01 DIAGNOSIS — N2581 Secondary hyperparathyroidism of renal origin: Secondary | ICD-10-CM | POA: Diagnosis not present

## 2016-05-01 MED ORDER — LEVOTHYROXINE SODIUM 75 MCG PO TABS: 75.0000 ug | ORAL_TABLET | Freq: Every day | ORAL | 1 refills | Status: DC

## 2016-05-01 NOTE — Addendum Note (Signed)
Addended by: Juanda Chance on: 05/01/2016 01:29 PM   Modules accepted: Orders

## 2016-05-01 NOTE — Telephone Encounter (Signed)
Pt spouse called and stated that when he picked up pt's other medication this one was not there. Can we please resend. Thank you!  Call pt @ [phone]

## 2016-05-01 NOTE — Telephone Encounter (Signed)
Rx resent to pharmacy

## 2016-05-02 ENCOUNTER — Ambulatory Visit (INDEPENDENT_AMBULATORY_CARE_PROVIDER_SITE_OTHER): Payer: MEDICAID | Admitting: Vascular Surgery

## 2016-05-02 ENCOUNTER — Ambulatory Visit (INDEPENDENT_AMBULATORY_CARE_PROVIDER_SITE_OTHER): Payer: BLUE CROSS/BLUE SHIELD

## 2016-05-02 DIAGNOSIS — Z992 Dependence on renal dialysis: Secondary | ICD-10-CM

## 2016-05-02 DIAGNOSIS — N186 End stage renal disease: Secondary | ICD-10-CM | POA: Diagnosis not present

## 2016-05-03 ENCOUNTER — Telehealth: Payer: Self-pay | Admitting: Family Medicine

## 2016-05-03 DIAGNOSIS — N2581 Secondary hyperparathyroidism of renal origin: Secondary | ICD-10-CM | POA: Diagnosis not present

## 2016-05-03 DIAGNOSIS — D631 Anemia in chronic kidney disease: Secondary | ICD-10-CM | POA: Diagnosis not present

## 2016-05-03 DIAGNOSIS — N186 End stage renal disease: Secondary | ICD-10-CM | POA: Diagnosis not present

## 2016-05-03 DIAGNOSIS — E1122 Type 2 diabetes mellitus with diabetic chronic kidney disease: Secondary | ICD-10-CM | POA: Diagnosis not present

## 2016-05-03 NOTE — Telephone Encounter (Signed)
Please advise 

## 2016-05-03 NOTE — Telephone Encounter (Signed)
Pt spouse called and stated that the pt spoke with Dr.Kollurur yesterday, and he stated that it was ok to take the gabapentin. Please advise, thank you!  Call pt @ [phone]

## 2016-05-04 DIAGNOSIS — E113513 Type 2 diabetes mellitus with proliferative diabetic retinopathy with macular edema, bilateral: Secondary | ICD-10-CM | POA: Diagnosis not present

## 2016-05-04 DIAGNOSIS — E113512 Type 2 diabetes mellitus with proliferative diabetic retinopathy with macular edema, left eye: Secondary | ICD-10-CM | POA: Diagnosis not present

## 2016-05-04 DIAGNOSIS — E113511 Type 2 diabetes mellitus with proliferative diabetic retinopathy with macular edema, right eye: Secondary | ICD-10-CM | POA: Diagnosis not present

## 2016-05-05 DIAGNOSIS — E1122 Type 2 diabetes mellitus with diabetic chronic kidney disease: Secondary | ICD-10-CM | POA: Diagnosis not present

## 2016-05-05 DIAGNOSIS — N186 End stage renal disease: Secondary | ICD-10-CM | POA: Diagnosis not present

## 2016-05-05 DIAGNOSIS — D631 Anemia in chronic kidney disease: Secondary | ICD-10-CM | POA: Diagnosis not present

## 2016-05-05 DIAGNOSIS — N2581 Secondary hyperparathyroidism of renal origin: Secondary | ICD-10-CM | POA: Diagnosis not present

## 2016-05-07 ENCOUNTER — Encounter (INDEPENDENT_AMBULATORY_CARE_PROVIDER_SITE_OTHER): Payer: Self-pay | Admitting: Vascular Surgery

## 2016-05-09 ENCOUNTER — Telehealth (INDEPENDENT_AMBULATORY_CARE_PROVIDER_SITE_OTHER): Payer: Self-pay | Admitting: Vascular Surgery

## 2016-05-09 DIAGNOSIS — D631 Anemia in chronic kidney disease: Secondary | ICD-10-CM | POA: Diagnosis not present

## 2016-05-09 DIAGNOSIS — N2581 Secondary hyperparathyroidism of renal origin: Secondary | ICD-10-CM | POA: Diagnosis not present

## 2016-05-09 DIAGNOSIS — N186 End stage renal disease: Secondary | ICD-10-CM | POA: Diagnosis not present

## 2016-05-09 DIAGNOSIS — E1122 Type 2 diabetes mellitus with diabetic chronic kidney disease: Secondary | ICD-10-CM | POA: Diagnosis not present

## 2016-05-09 NOTE — Telephone Encounter (Signed)
Pt hasn't received results of her [place] from 2.28.18 and wanting to know status. Please call pt [phone]

## 2016-05-10 DIAGNOSIS — E1122 Type 2 diabetes mellitus with diabetic chronic kidney disease: Secondary | ICD-10-CM | POA: Diagnosis not present

## 2016-05-10 DIAGNOSIS — N2581 Secondary hyperparathyroidism of renal origin: Secondary | ICD-10-CM | POA: Diagnosis not present

## 2016-05-10 DIAGNOSIS — D631 Anemia in chronic kidney disease: Secondary | ICD-10-CM | POA: Diagnosis not present

## 2016-05-10 DIAGNOSIS — N186 End stage renal disease: Secondary | ICD-10-CM | POA: Diagnosis not present

## 2016-05-10 NOTE — Telephone Encounter (Signed)
Discussed duplex results. Patient has not started dialysis yet. No issues with permcath. No significant issues with duplex. OK to start dilaysis through access. Once successful for three to four sessions will need to remove permcath.

## 2016-05-15 DIAGNOSIS — N186 End stage renal disease: Secondary | ICD-10-CM | POA: Diagnosis not present

## 2016-05-15 DIAGNOSIS — E1122 Type 2 diabetes mellitus with diabetic chronic kidney disease: Secondary | ICD-10-CM | POA: Diagnosis not present

## 2016-05-15 DIAGNOSIS — N2581 Secondary hyperparathyroidism of renal origin: Secondary | ICD-10-CM | POA: Diagnosis not present

## 2016-05-15 DIAGNOSIS — D631 Anemia in chronic kidney disease: Secondary | ICD-10-CM | POA: Diagnosis not present

## 2016-05-17 DIAGNOSIS — N2581 Secondary hyperparathyroidism of renal origin: Secondary | ICD-10-CM | POA: Diagnosis not present

## 2016-05-17 DIAGNOSIS — D631 Anemia in chronic kidney disease: Secondary | ICD-10-CM | POA: Diagnosis not present

## 2016-05-17 DIAGNOSIS — N186 End stage renal disease: Secondary | ICD-10-CM | POA: Diagnosis not present

## 2016-05-17 DIAGNOSIS — E1122 Type 2 diabetes mellitus with diabetic chronic kidney disease: Secondary | ICD-10-CM | POA: Diagnosis not present

## 2016-05-19 DIAGNOSIS — N186 End stage renal disease: Secondary | ICD-10-CM | POA: Diagnosis not present

## 2016-05-19 DIAGNOSIS — D631 Anemia in chronic kidney disease: Secondary | ICD-10-CM | POA: Diagnosis not present

## 2016-05-19 DIAGNOSIS — E1122 Type 2 diabetes mellitus with diabetic chronic kidney disease: Secondary | ICD-10-CM | POA: Diagnosis not present

## 2016-05-19 DIAGNOSIS — N2581 Secondary hyperparathyroidism of renal origin: Secondary | ICD-10-CM | POA: Diagnosis not present

## 2016-05-22 DIAGNOSIS — N186 End stage renal disease: Secondary | ICD-10-CM | POA: Diagnosis not present

## 2016-05-22 DIAGNOSIS — E1122 Type 2 diabetes mellitus with diabetic chronic kidney disease: Secondary | ICD-10-CM | POA: Diagnosis not present

## 2016-05-22 DIAGNOSIS — D631 Anemia in chronic kidney disease: Secondary | ICD-10-CM | POA: Diagnosis not present

## 2016-05-22 DIAGNOSIS — N2581 Secondary hyperparathyroidism of renal origin: Secondary | ICD-10-CM | POA: Diagnosis not present

## 2016-05-23 ENCOUNTER — Other Ambulatory Visit: Payer: Self-pay | Admitting: Cardiovascular Disease

## 2016-05-23 ENCOUNTER — Other Ambulatory Visit: Payer: Self-pay | Admitting: Family Medicine

## 2016-05-24 ENCOUNTER — Encounter (INDEPENDENT_AMBULATORY_CARE_PROVIDER_SITE_OTHER): Payer: Self-pay

## 2016-05-24 DIAGNOSIS — N186 End stage renal disease: Secondary | ICD-10-CM | POA: Diagnosis not present

## 2016-05-24 DIAGNOSIS — N2581 Secondary hyperparathyroidism of renal origin: Secondary | ICD-10-CM | POA: Diagnosis not present

## 2016-05-24 DIAGNOSIS — D631 Anemia in chronic kidney disease: Secondary | ICD-10-CM | POA: Diagnosis not present

## 2016-05-24 DIAGNOSIS — E1122 Type 2 diabetes mellitus with diabetic chronic kidney disease: Secondary | ICD-10-CM | POA: Diagnosis not present

## 2016-05-24 NOTE — Telephone Encounter (Signed)
Last OV 04/23/16 last filled 12/12/15 30 3rf

## 2016-05-26 DIAGNOSIS — N186 End stage renal disease: Secondary | ICD-10-CM | POA: Diagnosis not present

## 2016-05-26 DIAGNOSIS — E1122 Type 2 diabetes mellitus with diabetic chronic kidney disease: Secondary | ICD-10-CM | POA: Diagnosis not present

## 2016-05-26 DIAGNOSIS — D631 Anemia in chronic kidney disease: Secondary | ICD-10-CM | POA: Diagnosis not present

## 2016-05-26 DIAGNOSIS — N2581 Secondary hyperparathyroidism of renal origin: Secondary | ICD-10-CM | POA: Diagnosis not present

## 2016-05-27 NOTE — Telephone Encounter (Signed)
Noted. I'm not sure I'm comfortable going up on the dose of gabapentin given her end-stage renal disease and it has not been beneficial for her anyway. Please see if she has continued to take this. We may need to consider an alternative medication for her neuropathy. Thanks.

## 2016-05-28 NOTE — Telephone Encounter (Signed)
Patient states she had spoken to [place] and he said it would be fine to conitnue, patient is taking this and does not need an increase, message was just [place]

## 2016-05-28 NOTE — Telephone Encounter (Signed)
Left message to return call 

## 2016-05-28 NOTE — Telephone Encounter (Signed)
Noted  

## 2016-05-29 ENCOUNTER — Other Ambulatory Visit: Payer: Self-pay | Admitting: Vascular Surgery

## 2016-05-29 DIAGNOSIS — D631 Anemia in chronic kidney disease: Secondary | ICD-10-CM | POA: Diagnosis not present

## 2016-05-29 DIAGNOSIS — N186 End stage renal disease: Secondary | ICD-10-CM | POA: Diagnosis not present

## 2016-05-29 DIAGNOSIS — N2581 Secondary hyperparathyroidism of renal origin: Secondary | ICD-10-CM | POA: Diagnosis not present

## 2016-05-29 DIAGNOSIS — E1122 Type 2 diabetes mellitus with diabetic chronic kidney disease: Secondary | ICD-10-CM | POA: Diagnosis not present

## 2016-05-31 DIAGNOSIS — E1122 Type 2 diabetes mellitus with diabetic chronic kidney disease: Secondary | ICD-10-CM | POA: Diagnosis not present

## 2016-05-31 DIAGNOSIS — D631 Anemia in chronic kidney disease: Secondary | ICD-10-CM | POA: Diagnosis not present

## 2016-05-31 DIAGNOSIS — N186 End stage renal disease: Secondary | ICD-10-CM | POA: Diagnosis not present

## 2016-05-31 DIAGNOSIS — N2581 Secondary hyperparathyroidism of renal origin: Secondary | ICD-10-CM | POA: Diagnosis not present

## 2016-06-01 ENCOUNTER — Ambulatory Visit
Admission: RE | Admit: 2016-06-01 | Discharge: 2016-06-01 | Disposition: A | Discharge status: Home / Self Care | Payer: BLUE CROSS/BLUE SHIELD | Source: Ambulatory Visit | Attending: Vascular Surgery | Admitting: Vascular Surgery

## 2016-06-01 ENCOUNTER — Encounter
Admission: RE | Disposition: A | Discharge status: Home / Self Care | Payer: Self-pay | Source: Ambulatory Visit | Attending: Vascular Surgery

## 2016-06-01 ENCOUNTER — Ambulatory Visit: Admit: 2016-06-01 | Payer: BLUE CROSS/BLUE SHIELD | Admitting: Vascular Surgery

## 2016-06-01 DIAGNOSIS — E1122 Type 2 diabetes mellitus with diabetic chronic kidney disease: Secondary | ICD-10-CM | POA: Insufficient documentation

## 2016-06-01 DIAGNOSIS — Z992 Dependence on renal dialysis: Secondary | ICD-10-CM | POA: Diagnosis not present

## 2016-06-01 DIAGNOSIS — Z801 Family history of malignant neoplasm of trachea, bronchus and lung: Secondary | ICD-10-CM | POA: Insufficient documentation

## 2016-06-01 DIAGNOSIS — E039 Hypothyroidism, unspecified: Secondary | ICD-10-CM | POA: Diagnosis not present

## 2016-06-01 DIAGNOSIS — I272 Pulmonary hypertension, unspecified: Secondary | ICD-10-CM | POA: Insufficient documentation

## 2016-06-01 DIAGNOSIS — Z951 Presence of aortocoronary bypass graft: Secondary | ICD-10-CM | POA: Insufficient documentation

## 2016-06-01 DIAGNOSIS — Z452 Encounter for adjustment and management of vascular access device: Secondary | ICD-10-CM | POA: Diagnosis present

## 2016-06-01 DIAGNOSIS — I2510 Atherosclerotic heart disease of native coronary artery without angina pectoris: Secondary | ICD-10-CM | POA: Insufficient documentation

## 2016-06-01 DIAGNOSIS — Z836 Family history of other diseases of the respiratory system: Secondary | ICD-10-CM | POA: Diagnosis not present

## 2016-06-01 DIAGNOSIS — I132 Hypertensive heart and chronic kidney disease with heart failure and with stage 5 chronic kidney disease, or end stage renal disease: Secondary | ICD-10-CM | POA: Insufficient documentation

## 2016-06-01 DIAGNOSIS — E1140 Type 2 diabetes mellitus with diabetic neuropathy, unspecified: Secondary | ICD-10-CM | POA: Diagnosis not present

## 2016-06-01 DIAGNOSIS — I5032 Chronic diastolic (congestive) heart failure: Secondary | ICD-10-CM | POA: Diagnosis not present

## 2016-06-01 DIAGNOSIS — T82868A Thrombosis of vascular prosthetic devices, implants and grafts, initial encounter: Secondary | ICD-10-CM | POA: Diagnosis not present

## 2016-06-01 DIAGNOSIS — Z9049 Acquired absence of other specified parts of digestive tract: Secondary | ICD-10-CM | POA: Diagnosis not present

## 2016-06-01 DIAGNOSIS — N186 End stage renal disease: Secondary | ICD-10-CM | POA: Insufficient documentation

## 2016-06-01 DIAGNOSIS — K219 Gastro-esophageal reflux disease without esophagitis: Secondary | ICD-10-CM | POA: Insufficient documentation

## 2016-06-01 DIAGNOSIS — I252 Old myocardial infarction: Secondary | ICD-10-CM | POA: Diagnosis not present

## 2016-06-01 DIAGNOSIS — E785 Hyperlipidemia, unspecified: Secondary | ICD-10-CM | POA: Diagnosis not present

## 2016-06-01 DIAGNOSIS — Z886 Allergy status to analgesic agent status: Secondary | ICD-10-CM | POA: Insufficient documentation

## 2016-06-01 DIAGNOSIS — Z9889 Other specified postprocedural states: Secondary | ICD-10-CM | POA: Diagnosis not present

## 2016-06-01 DIAGNOSIS — Z9842 Cataract extraction status, left eye: Secondary | ICD-10-CM | POA: Diagnosis not present

## 2016-06-01 DIAGNOSIS — Z9841 Cataract extraction status, right eye: Secondary | ICD-10-CM | POA: Diagnosis not present

## 2016-06-01 DIAGNOSIS — Z833 Family history of diabetes mellitus: Secondary | ICD-10-CM | POA: Insufficient documentation

## 2016-06-01 DIAGNOSIS — I255 Ischemic cardiomyopathy: Secondary | ICD-10-CM | POA: Insufficient documentation

## 2016-06-01 DIAGNOSIS — Z8249 Family history of ischemic heart disease and other diseases of the circulatory system: Secondary | ICD-10-CM | POA: Insufficient documentation

## 2016-06-01 DIAGNOSIS — D631 Anemia in chronic kidney disease: Secondary | ICD-10-CM | POA: Diagnosis not present

## 2016-06-01 HISTORY — PX: PORTA CATH REMOVAL: CATH118286

## 2016-06-01 LAB — GLUCOSE, CAPILLARY: Glucose-Capillary: 110 mg/dL — ABNORMAL HIGH (ref 65–99)

## 2016-06-01 SURGERY — DIALYSIS/PERMA CATHETER REMOVAL
Anesthesia: LOCAL

## 2016-06-01 SURGERY — PORTA CATH REMOVAL
Anesthesia: Moderate Sedation

## 2016-06-01 MED ORDER — LIDOCAINE-EPINEPHRINE (PF) 1 %-1:200000 IJ SOLN
INTRAMUSCULAR | Status: DC | PRN
  Administered 2016-06-01: 10 mL

## 2016-06-01 SURGICAL SUPPLY — 5 items
DERMABOND ADVANCED (GAUZE/BANDAGES/DRESSINGS) ×2
DERMABOND ADVANCED .7 DNX12 (GAUZE/BANDAGES/DRESSINGS) ×1 IMPLANT
PREP CHG 10.5 TEAL (MISCELLANEOUS) ×6 IMPLANT
SUT MNCRL AB 4-0 PS2 18 (SUTURE) ×3 IMPLANT
TRAY LACERAT/PLASTIC (MISCELLANEOUS) ×3 IMPLANT

## 2016-06-01 NOTE — H&P (Signed)
[facility] History & Physical Update  The patient was interviewed and re-examined.  The patient's previous History and Physical has been reviewed and is unchanged.  There is no change in the plan of care. We plan to proceed with the scheduled procedure.  Hortencia Pilar, MD  06/01/2016, 1:37 PM

## 2016-06-01 NOTE — Op Note (Signed)
  OPERATIVE NOTE   PROCEDURE: 1. Removal of a right IJ tunneled dialysis catheter  PRE-OPERATIVE DIAGNOSIS: Complication of dialysis catheter  POST-OPERATIVE DIAGNOSIS: Same  SURGEON: Hortencia Pilar  ANESTHESIA: Local anesthetic with 1% lidocaine with epinephrine   ESTIMATED BLOOD LOSS: Minimal   FINDING(S): 1. Catheter intact   SPECIMEN(S):  Catheter  INDICATIONS:   Jamie Arias is a 61 y.o. female who presents with functioning right arm AV graft with a nonfunctioning tunneled catheter.  The risks and benefits for catheter removal were reviewed all questions were answered patient agrees to proceed with catheter removal.  DESCRIPTION: After obtaining full informed written consent, the patient was positioned supine. The right IJ catheter and surrounding area is prepped and draped in a sterile fashion. The cuff was localized by palpation and noted to be greater than 3 cm from the exit site. After appropriate timeout is called, 1% lidocaine with epinephrine is infiltrated into the surrounding tissues around the cuff. Small transverse incision is created with an 11 blade scalpel and the dissection was carried down to expose the cuff of the tunneled catheter.  The catheter is then freed from the surrounding attachments and adhesions. Once the catheter has been freed circumferentially it is transected just distal to the cuff and subsequently removed in 2 pieces. Light pressure was held at the base of the neck. A 4-0 Monocryl was used close the tunnel in the subcutaneous space. The 4-0 Monocryl Monocryl was then used to close the skin in a subcuticular stitch. Dermabond is applied.  Antibiotic ointment and a sterile dressing is applied to the exit site. Patient tolerated procedure well and there were no complications.  COMPLICATIONS: None  CONDITION: Unchanged  Hortencia Pilar. [facility] Office: [phone]  06/01/2016,2:58 PM

## 2016-06-01 NOTE — H&P (Signed)
[facility] Admission History & Physical  MRN : [id number]  Jamie Arias is a 61 y.o. ([birth year]) female who presents with chief complaint of No chief complaint on file. Marland Kitchen  History of Present Illness: I am asked to evaluate the patient by the dialysis center. The patient was sent here because they have a functioning right arm access and there tunnel catheter is no longer working.  Patient denies fever chills. No tenderness or pain at the catheter site.  The patient has done well on dialysis and has not missed any sessions.  No current facility-administered medications for this encounter.     Past Medical History:  Diagnosis Date  . Anemia   . Chronic diastolic CHF (congestive heart failure) ([place])    a. Due to ischemic cardiomyopathy. EF as low as 35%, improved to normal s/p CABG; b. echo 07/06/13: EF 55-60%, no RWMAs, mod TR, trivial pericardial effusion not c/w tamponade physiology;  c. 10/2015 Echo: EF 65%, Gr1 DD, triv AI, mild MR, mildly dil LA, mod TR, PASP 55mmHg.  Marland Kitchen Coronary artery disease    a. NSTEMI 06/2013; b.cath: severe three-vessel CAD w/ EF 30% & mild-mod MR; c. s/p 3 vessel CABG 07/02/13 (LIMA-LAD, SVG-OM, and SVG-RPDA);  d. 10/2015 MV: no ischemia/infarct.  . Diabetes mellitus without complication ([place])   . Diabetic neuropathy ([place])   . Dialysis patient [facility])    Tues, Thurs, Sat  . ESRD (end stage renal disease) ([place])    a. 12/2015 initiated TTS dialysis.  Marland Kitchen GERD (gastroesophageal reflux disease)   . Hyperlipidemia   . Hypertension   . Hypothyroidism   . Myocardial infarction 2015  . Neuropathy ([place])   . Pleural effusion 2015  . Pulmonary hypertension   . Wears dentures    full lower    Past Surgical History:  Procedure Laterality Date  . AV FISTULA PLACEMENT Right 02/03/2016   Procedure: INSERTION OF ARTERIOVENOUS (AV) GORE-TEX GRAFT ARM ( BRACH / AXILLARY );  Surgeon: Katha Cabal, MD;  Location: [facility];  Service: Vascular;   Laterality: Right;  . CARDIAC CATHETERIZATION    . CATARACT EXTRACTION Bilateral   . CHOLECYSTECTOMY N/A 12/09/2014   Procedure: LAPAROSCOPIC CHOLECYSTECTOMY;  Surgeon: Marlyce [name], MD;  Location: [facility];  Service: General;  Laterality: N/A;  . CORONARY ARTERY BYPASS GRAFT N/A 07/02/2013   Procedure: CORONARY ARTERY BYPASS GRAFTING (CABG);  Surgeon: Ivin [name], MD;  Location: [place];  Service: Open Heart Surgery;  Laterality: N/A;  CABG x three, using left internal mammary artery and right leg greater saphenous vein harvested endoscopically  . ESOPHAGOGASTRODUODENOSCOPY (EGD) WITH PROPOFOL N/A 11/24/2015   Procedure: ESOPHAGOGASTRODUODENOSCOPY (EGD) WITH PROPOFOL;  Surgeon: [name], MD;  Location: [place];  Service: Endoscopy;  Laterality: N/A;  Diabetic - insulin  . EYE SURGERY Bilateral    Cataract Extraction with IOL  . INTRAOPERATIVE TRANSESOPHAGEAL ECHOCARDIOGRAM N/A 07/02/2013   Procedure: INTRAOPERATIVE TRANSESOPHAGEAL ECHOCARDIOGRAM;  Surgeon: Ivin [name], MD;  Location: [place];  Service: Open Heart Surgery;  Laterality: N/A;  . PERIPHERAL VASCULAR CATHETERIZATION Right 12/06/2015   Procedure: Dialysis/Perma Catheter Insertion;  Surgeon: Katha Cabal, MD;  Location: [place] [facility];  Service: Cardiovascular;  Laterality: Right;  . THORACENTESIS Left 2015    Social History Social History  Substance Use Topics  . Smoking status: Never Smoker  . Smokeless tobacco: Never Used  . Alcohol use No    Family History Family History  Problem Relation Age of  Onset  . COPD Mother   . Cancer Mother     Lung  . Pulmonary embolism Father   . Diabetes Father   . Diabetes Paternal Grandfather   . Heart disease Maternal Grandmother   . Colon cancer Neg Hx   . Colon polyps Neg Hx   . Esophageal cancer Neg Hx   . Pancreatic cancer Neg Hx   . Liver disease Neg Hx     No family history of bleeding or clotting disorders, autoimmune disease or  porphyria  Allergies  Allergen Reactions  . Nsaids Other (See Comments)    Contraindicated due to kidney disease.     REVIEW OF SYSTEMS (Negative unless checked)  Constitutional: [] Weight loss  [] Fever  [] Chills Cardiac: [] Chest pain   [] Chest pressure   [] Palpitations   [] Shortness of breath when laying flat   [] Shortness of breath at rest   [x] Shortness of breath with exertion. Vascular:  [] Pain in legs with walking   [] Pain in legs at rest   [] Pain in legs when laying flat   [] Claudication   [] Pain in feet when walking  [] Pain in feet at rest  [] Pain in feet when laying flat   [] History of DVT   [] Phlebitis   [] Swelling in legs   [] Varicose veins   [] Non-healing ulcers Pulmonary:   [] Uses home oxygen   [] Productive cough   [] Hemoptysis   [] Wheeze  [] COPD   [] Asthma Neurologic:  [] Dizziness  [] Blackouts   [] Seizures   [] History of stroke   [] History of TIA  [] Aphasia   [] Temporary blindness   [] Dysphagia   [] Weakness or numbness in arms   [] Weakness or numbness in legs Musculoskeletal:  [] Arthritis   [] Joint swelling   [] Joint pain   [] Low back pain Hematologic:  [] Easy bruising  [] Easy bleeding   [] Hypercoagulable state   [] Anemic  [] Hepatitis Gastrointestinal:  [] Blood in stool   [] Vomiting blood  [] Gastroesophageal reflux/heartburn   [] Difficulty swallowing. Genitourinary:  [x] Chronic kidney disease   [] Difficult urination  [] Frequent urination  [] Burning with urination   [] Blood in urine Skin:  [] Rashes   [] Ulcers   [] Wounds Psychological:  [] History of anxiety   []  History of major depression.  Physical Examination  Vitals:   06/01/16 1332  BP: (!) 158/70  Pulse: 60  Resp: 18  Temp: 98.3 F (36.8 C)  TempSrc: Oral  SpO2: 98%  Weight: 79.8 kg (176 lb)  Height: 5\' 4"  (1.626 m)   Body mass index is 30.21 kg/m. Gen: WD/WN, NAD Head: [place]/AT, No temporalis wasting. Prominent temp pulse not noted. Ear/Nose/Throat: Hearing grossly intact, nares w/o erythema or drainage,  oropharynx w/o Erythema/Exudate,  Eyes: Conjunctiva clear, sclera non-icteric Neck: Trachea midline.  No JVD.  Pulmonary:  Good air movement, respirations not labored, no use of accessory muscles.  Cardiac: RRR, normal S1, S2. Vascular: Right IJ tunnel catheter intact; right brachial axillary AV graft good thrill good bruit. Skin overlying the graft is intact and appears healthy Vessel Right Left  Radial Palpable Palpable  Ulnar Not Palpable Not Palpable  Brachial Palpable Palpable  Carotid Palpable, without bruit Palpable, without bruit  Gastrointestinal: soft, non-tender/non-distended. No guarding/reflex.  Musculoskeletal: M/S 5/5 throughout.  Extremities without ischemic changes.  No deformity or atrophy.  Neurologic: Sensation grossly intact in extremities.  Symmetrical.  Speech is fluent. Motor exam as listed above. Psychiatric: Judgment intact, Mood & affect appropriate for pt's clinical situation. Dermatologic: No rashes or ulcers noted.  No cellulitis or open wounds. Lymph : No  Cervical, Axillary, or Inguinal lymphadenopathy.   CBC Lab Results  Component Value Date   WBC 5.7 01/30/2016   HGB 11.2 (L) 02/03/2016   HCT 33.0 (L) 02/03/2016   MCV 87.1 01/30/2016   PLT 174 01/30/2016    BMET    Component Value Date/Time   NA 138 02/03/2016 0958   NA 140 03/30/2014 1611   K 3.1 (L) 02/03/2016 0958   K 4.3 03/30/2014 1611   CL 101 01/30/2016 1507   CL 105 03/30/2014 1611   CO2 25 01/30/2016 1507   CO2 29 03/30/2014 1611   GLUCOSE 122 (H) 02/03/2016 0958   GLUCOSE 187 (H) 03/30/2014 1611   BUN 30 (H) 01/30/2016 1507   BUN 21 (H) 03/30/2014 1611   CREATININE 3.53 (H) 01/30/2016 1507   CREATININE 3.14 (H) 09/09/2015 1534   CALCIUM 8.2 (L) 01/30/2016 1507   CALCIUM 9.0 03/30/2014 1611   GFRNONAA 13 (L) 01/30/2016 1507   GFRNONAA 36 (L) 03/30/2014 1611   GFRNONAA >60 07/23/2013 0741   GFRAA 15 (L) 01/30/2016 1507   GFRAA 44 (L) 03/30/2014 1611   GFRAA >60 07/23/2013  0741   CrCl cannot be calculated (Patient's most recent lab result is older than the maximum 21 days allowed.).  COAG Lab Results  Component Value Date   INR 0.99 01/30/2016   INR 1.19 07/13/2013   INR 1.27 07/12/2013    Radiology No results found.  Assessment/Plan 1.  Complication dialysis device with Nonfunction of tunneled catheter:  Patient's tunneled catheter is nonfunctioning and her access in the right arm is working well. She will therefore undergo removal of the tunneled catheter.  2.  End-stage renal disease requiring hemodialysis:  Patient will continue dialysis therapy without further interruption. 3.  Hypertension:  Patient will continue medical management; nephrology is following no changes in oral medications. 4. Diabetes mellitus:  Glucose will be monitored and oral medications been held this morning once the patient has undergone the patient's procedure po intake will be reinitiated and again Accu-Cheks will be used to assess the blood glucose level and treat as needed. The patient will be restarted on the patient's usual hypoglycemic regime   Hortencia Pilar, MD  06/01/2016 1:44 PM

## 2016-06-02 DIAGNOSIS — Z992 Dependence on renal dialysis: Secondary | ICD-10-CM | POA: Diagnosis not present

## 2016-06-02 DIAGNOSIS — N2581 Secondary hyperparathyroidism of renal origin: Secondary | ICD-10-CM | POA: Diagnosis not present

## 2016-06-02 DIAGNOSIS — E1122 Type 2 diabetes mellitus with diabetic chronic kidney disease: Secondary | ICD-10-CM | POA: Diagnosis not present

## 2016-06-02 DIAGNOSIS — N186 End stage renal disease: Secondary | ICD-10-CM | POA: Diagnosis not present

## 2016-06-02 DIAGNOSIS — D631 Anemia in chronic kidney disease: Secondary | ICD-10-CM | POA: Diagnosis not present

## 2016-06-05 DIAGNOSIS — N186 End stage renal disease: Secondary | ICD-10-CM | POA: Diagnosis not present

## 2016-06-05 DIAGNOSIS — Z23 Encounter for immunization: Secondary | ICD-10-CM | POA: Diagnosis not present

## 2016-06-05 DIAGNOSIS — E1122 Type 2 diabetes mellitus with diabetic chronic kidney disease: Secondary | ICD-10-CM | POA: Diagnosis not present

## 2016-06-05 DIAGNOSIS — N2581 Secondary hyperparathyroidism of renal origin: Secondary | ICD-10-CM | POA: Diagnosis not present

## 2016-06-06 ENCOUNTER — Encounter: Payer: Self-pay | Admitting: Vascular Surgery

## 2016-06-07 DIAGNOSIS — N2581 Secondary hyperparathyroidism of renal origin: Secondary | ICD-10-CM | POA: Diagnosis not present

## 2016-06-07 DIAGNOSIS — E1122 Type 2 diabetes mellitus with diabetic chronic kidney disease: Secondary | ICD-10-CM | POA: Diagnosis not present

## 2016-06-07 DIAGNOSIS — N186 End stage renal disease: Secondary | ICD-10-CM | POA: Diagnosis not present

## 2016-06-07 DIAGNOSIS — Z23 Encounter for immunization: Secondary | ICD-10-CM | POA: Diagnosis not present

## 2016-06-09 DIAGNOSIS — N186 End stage renal disease: Secondary | ICD-10-CM | POA: Diagnosis not present

## 2016-06-09 DIAGNOSIS — N2581 Secondary hyperparathyroidism of renal origin: Secondary | ICD-10-CM | POA: Diagnosis not present

## 2016-06-09 DIAGNOSIS — E1122 Type 2 diabetes mellitus with diabetic chronic kidney disease: Secondary | ICD-10-CM | POA: Diagnosis not present

## 2016-06-09 DIAGNOSIS — Z23 Encounter for immunization: Secondary | ICD-10-CM | POA: Diagnosis not present

## 2016-06-12 DIAGNOSIS — E1122 Type 2 diabetes mellitus with diabetic chronic kidney disease: Secondary | ICD-10-CM | POA: Diagnosis not present

## 2016-06-12 DIAGNOSIS — N2581 Secondary hyperparathyroidism of renal origin: Secondary | ICD-10-CM | POA: Diagnosis not present

## 2016-06-12 DIAGNOSIS — N186 End stage renal disease: Secondary | ICD-10-CM | POA: Diagnosis not present

## 2016-06-12 DIAGNOSIS — Z23 Encounter for immunization: Secondary | ICD-10-CM | POA: Diagnosis not present

## 2016-06-14 DIAGNOSIS — Z23 Encounter for immunization: Secondary | ICD-10-CM | POA: Diagnosis not present

## 2016-06-14 DIAGNOSIS — E1122 Type 2 diabetes mellitus with diabetic chronic kidney disease: Secondary | ICD-10-CM | POA: Diagnosis not present

## 2016-06-14 DIAGNOSIS — N186 End stage renal disease: Secondary | ICD-10-CM | POA: Diagnosis not present

## 2016-06-14 DIAGNOSIS — N2581 Secondary hyperparathyroidism of renal origin: Secondary | ICD-10-CM | POA: Diagnosis not present

## 2016-06-16 DIAGNOSIS — Z23 Encounter for immunization: Secondary | ICD-10-CM | POA: Diagnosis not present

## 2016-06-16 DIAGNOSIS — N186 End stage renal disease: Secondary | ICD-10-CM | POA: Diagnosis not present

## 2016-06-16 DIAGNOSIS — N2581 Secondary hyperparathyroidism of renal origin: Secondary | ICD-10-CM | POA: Diagnosis not present

## 2016-06-16 DIAGNOSIS — E1122 Type 2 diabetes mellitus with diabetic chronic kidney disease: Secondary | ICD-10-CM | POA: Diagnosis not present

## 2016-06-19 DIAGNOSIS — Z23 Encounter for immunization: Secondary | ICD-10-CM | POA: Diagnosis not present

## 2016-06-19 DIAGNOSIS — N186 End stage renal disease: Secondary | ICD-10-CM | POA: Diagnosis not present

## 2016-06-19 DIAGNOSIS — N2581 Secondary hyperparathyroidism of renal origin: Secondary | ICD-10-CM | POA: Diagnosis not present

## 2016-06-19 DIAGNOSIS — E1122 Type 2 diabetes mellitus with diabetic chronic kidney disease: Secondary | ICD-10-CM | POA: Diagnosis not present

## 2016-06-21 DIAGNOSIS — E1122 Type 2 diabetes mellitus with diabetic chronic kidney disease: Secondary | ICD-10-CM | POA: Diagnosis not present

## 2016-06-21 DIAGNOSIS — Z23 Encounter for immunization: Secondary | ICD-10-CM | POA: Diagnosis not present

## 2016-06-21 DIAGNOSIS — N186 End stage renal disease: Secondary | ICD-10-CM | POA: Diagnosis not present

## 2016-06-21 DIAGNOSIS — N2581 Secondary hyperparathyroidism of renal origin: Secondary | ICD-10-CM | POA: Diagnosis not present

## 2016-06-22 ENCOUNTER — Other Ambulatory Visit: Payer: Self-pay | Admitting: Family Medicine

## 2016-06-23 DIAGNOSIS — Z23 Encounter for immunization: Secondary | ICD-10-CM | POA: Diagnosis not present

## 2016-06-23 DIAGNOSIS — N186 End stage renal disease: Secondary | ICD-10-CM | POA: Diagnosis not present

## 2016-06-23 DIAGNOSIS — E1122 Type 2 diabetes mellitus with diabetic chronic kidney disease: Secondary | ICD-10-CM | POA: Diagnosis not present

## 2016-06-23 DIAGNOSIS — N2581 Secondary hyperparathyroidism of renal origin: Secondary | ICD-10-CM | POA: Diagnosis not present

## 2016-06-26 DIAGNOSIS — N186 End stage renal disease: Secondary | ICD-10-CM | POA: Diagnosis not present

## 2016-06-26 DIAGNOSIS — Z23 Encounter for immunization: Secondary | ICD-10-CM | POA: Diagnosis not present

## 2016-06-26 DIAGNOSIS — N2581 Secondary hyperparathyroidism of renal origin: Secondary | ICD-10-CM | POA: Diagnosis not present

## 2016-06-26 DIAGNOSIS — E1122 Type 2 diabetes mellitus with diabetic chronic kidney disease: Secondary | ICD-10-CM | POA: Diagnosis not present

## 2016-06-28 DIAGNOSIS — Z23 Encounter for immunization: Secondary | ICD-10-CM | POA: Diagnosis not present

## 2016-06-28 DIAGNOSIS — N186 End stage renal disease: Secondary | ICD-10-CM | POA: Diagnosis not present

## 2016-06-28 DIAGNOSIS — E1122 Type 2 diabetes mellitus with diabetic chronic kidney disease: Secondary | ICD-10-CM | POA: Diagnosis not present

## 2016-06-28 DIAGNOSIS — N2581 Secondary hyperparathyroidism of renal origin: Secondary | ICD-10-CM | POA: Diagnosis not present

## 2016-06-30 DIAGNOSIS — E1122 Type 2 diabetes mellitus with diabetic chronic kidney disease: Secondary | ICD-10-CM | POA: Diagnosis not present

## 2016-06-30 DIAGNOSIS — Z23 Encounter for immunization: Secondary | ICD-10-CM | POA: Diagnosis not present

## 2016-06-30 DIAGNOSIS — N2581 Secondary hyperparathyroidism of renal origin: Secondary | ICD-10-CM | POA: Diagnosis not present

## 2016-06-30 DIAGNOSIS — N186 End stage renal disease: Secondary | ICD-10-CM | POA: Diagnosis not present

## 2016-07-02 DIAGNOSIS — Z992 Dependence on renal dialysis: Secondary | ICD-10-CM | POA: Diagnosis not present

## 2016-07-02 DIAGNOSIS — N186 End stage renal disease: Secondary | ICD-10-CM | POA: Diagnosis not present

## 2016-07-03 DIAGNOSIS — N2581 Secondary hyperparathyroidism of renal origin: Secondary | ICD-10-CM | POA: Diagnosis not present

## 2016-07-03 DIAGNOSIS — E1122 Type 2 diabetes mellitus with diabetic chronic kidney disease: Secondary | ICD-10-CM | POA: Diagnosis not present

## 2016-07-03 DIAGNOSIS — N186 End stage renal disease: Secondary | ICD-10-CM | POA: Diagnosis not present

## 2016-07-03 DIAGNOSIS — D631 Anemia in chronic kidney disease: Secondary | ICD-10-CM | POA: Diagnosis not present

## 2016-07-05 DIAGNOSIS — E1122 Type 2 diabetes mellitus with diabetic chronic kidney disease: Secondary | ICD-10-CM | POA: Diagnosis not present

## 2016-07-05 DIAGNOSIS — N186 End stage renal disease: Secondary | ICD-10-CM | POA: Diagnosis not present

## 2016-07-05 DIAGNOSIS — D631 Anemia in chronic kidney disease: Secondary | ICD-10-CM | POA: Diagnosis not present

## 2016-07-05 DIAGNOSIS — N2581 Secondary hyperparathyroidism of renal origin: Secondary | ICD-10-CM | POA: Diagnosis not present

## 2016-07-07 DIAGNOSIS — D631 Anemia in chronic kidney disease: Secondary | ICD-10-CM | POA: Diagnosis not present

## 2016-07-07 DIAGNOSIS — E1122 Type 2 diabetes mellitus with diabetic chronic kidney disease: Secondary | ICD-10-CM | POA: Diagnosis not present

## 2016-07-07 DIAGNOSIS — N186 End stage renal disease: Secondary | ICD-10-CM | POA: Diagnosis not present

## 2016-07-07 DIAGNOSIS — N2581 Secondary hyperparathyroidism of renal origin: Secondary | ICD-10-CM | POA: Diagnosis not present

## 2016-07-10 DIAGNOSIS — E1122 Type 2 diabetes mellitus with diabetic chronic kidney disease: Secondary | ICD-10-CM | POA: Diagnosis not present

## 2016-07-10 DIAGNOSIS — N2581 Secondary hyperparathyroidism of renal origin: Secondary | ICD-10-CM | POA: Diagnosis not present

## 2016-07-10 DIAGNOSIS — D631 Anemia in chronic kidney disease: Secondary | ICD-10-CM | POA: Diagnosis not present

## 2016-07-10 DIAGNOSIS — N186 End stage renal disease: Secondary | ICD-10-CM | POA: Diagnosis not present

## 2016-07-12 DIAGNOSIS — N186 End stage renal disease: Secondary | ICD-10-CM | POA: Diagnosis not present

## 2016-07-12 DIAGNOSIS — N2581 Secondary hyperparathyroidism of renal origin: Secondary | ICD-10-CM | POA: Diagnosis not present

## 2016-07-12 DIAGNOSIS — D631 Anemia in chronic kidney disease: Secondary | ICD-10-CM | POA: Diagnosis not present

## 2016-07-12 DIAGNOSIS — E1122 Type 2 diabetes mellitus with diabetic chronic kidney disease: Secondary | ICD-10-CM | POA: Diagnosis not present

## 2016-07-14 DIAGNOSIS — N186 End stage renal disease: Secondary | ICD-10-CM | POA: Diagnosis not present

## 2016-07-14 DIAGNOSIS — E1122 Type 2 diabetes mellitus with diabetic chronic kidney disease: Secondary | ICD-10-CM | POA: Diagnosis not present

## 2016-07-14 DIAGNOSIS — N2581 Secondary hyperparathyroidism of renal origin: Secondary | ICD-10-CM | POA: Diagnosis not present

## 2016-07-14 DIAGNOSIS — D631 Anemia in chronic kidney disease: Secondary | ICD-10-CM | POA: Diagnosis not present

## 2016-07-17 DIAGNOSIS — D631 Anemia in chronic kidney disease: Secondary | ICD-10-CM | POA: Diagnosis not present

## 2016-07-17 DIAGNOSIS — E1122 Type 2 diabetes mellitus with diabetic chronic kidney disease: Secondary | ICD-10-CM | POA: Diagnosis not present

## 2016-07-17 DIAGNOSIS — N2581 Secondary hyperparathyroidism of renal origin: Secondary | ICD-10-CM | POA: Diagnosis not present

## 2016-07-17 DIAGNOSIS — N186 End stage renal disease: Secondary | ICD-10-CM | POA: Diagnosis not present

## 2016-07-19 DIAGNOSIS — N2581 Secondary hyperparathyroidism of renal origin: Secondary | ICD-10-CM | POA: Diagnosis not present

## 2016-07-19 DIAGNOSIS — N186 End stage renal disease: Secondary | ICD-10-CM | POA: Diagnosis not present

## 2016-07-19 DIAGNOSIS — D631 Anemia in chronic kidney disease: Secondary | ICD-10-CM | POA: Diagnosis not present

## 2016-07-19 DIAGNOSIS — E1122 Type 2 diabetes mellitus with diabetic chronic kidney disease: Secondary | ICD-10-CM | POA: Diagnosis not present

## 2016-07-24 DIAGNOSIS — D631 Anemia in chronic kidney disease: Secondary | ICD-10-CM | POA: Diagnosis not present

## 2016-07-24 DIAGNOSIS — N2581 Secondary hyperparathyroidism of renal origin: Secondary | ICD-10-CM | POA: Diagnosis not present

## 2016-07-24 DIAGNOSIS — N186 End stage renal disease: Secondary | ICD-10-CM | POA: Diagnosis not present

## 2016-07-24 DIAGNOSIS — E1122 Type 2 diabetes mellitus with diabetic chronic kidney disease: Secondary | ICD-10-CM | POA: Diagnosis not present

## 2016-07-25 ENCOUNTER — Ambulatory Visit (INDEPENDENT_AMBULATORY_CARE_PROVIDER_SITE_OTHER): Payer: Medicare Other | Admitting: Family Medicine

## 2016-07-25 ENCOUNTER — Encounter: Payer: Self-pay | Admitting: Nurse Practitioner

## 2016-07-25 ENCOUNTER — Ambulatory Visit (INDEPENDENT_AMBULATORY_CARE_PROVIDER_SITE_OTHER): Payer: Medicare Other | Admitting: Nurse Practitioner

## 2016-07-25 ENCOUNTER — Other Ambulatory Visit: Payer: Self-pay | Admitting: Family Medicine

## 2016-07-25 ENCOUNTER — Encounter: Payer: Self-pay | Admitting: Family Medicine

## 2016-07-25 VITALS — BP 118/60 | HR 69 | Temp 98.3°F | Wt 186.8 lb

## 2016-07-25 VITALS — BP 126/54 | HR 67 | Ht 64.0 in | Wt 185.5 lb

## 2016-07-25 DIAGNOSIS — N186 End stage renal disease: Secondary | ICD-10-CM

## 2016-07-25 DIAGNOSIS — Z992 Dependence on renal dialysis: Secondary | ICD-10-CM | POA: Diagnosis not present

## 2016-07-25 DIAGNOSIS — E1140 Type 2 diabetes mellitus with diabetic neuropathy, unspecified: Secondary | ICD-10-CM

## 2016-07-25 DIAGNOSIS — I10 Essential (primary) hypertension: Secondary | ICD-10-CM

## 2016-07-25 DIAGNOSIS — Z794 Long term (current) use of insulin: Principal | ICD-10-CM

## 2016-07-25 DIAGNOSIS — I2510 Atherosclerotic heart disease of native coronary artery without angina pectoris: Secondary | ICD-10-CM

## 2016-07-25 DIAGNOSIS — I5032 Chronic diastolic (congestive) heart failure: Secondary | ICD-10-CM

## 2016-07-25 DIAGNOSIS — E039 Hypothyroidism, unspecified: Secondary | ICD-10-CM

## 2016-07-25 DIAGNOSIS — E785 Hyperlipidemia, unspecified: Secondary | ICD-10-CM | POA: Diagnosis not present

## 2016-07-25 LAB — HEMOGLOBIN A1C: Hgb A1c MFr Bld: 9.3 % — ABNORMAL HIGH (ref 4.6–6.5)

## 2016-07-25 LAB — TSH: TSH: 1.36 u[IU]/mL (ref 0.35–4.50)

## 2016-07-25 NOTE — Patient Instructions (Signed)
Nice to see you. Please discuss your Imdur with the cardiologist when you see him later today. I'm not sure you need your other blood pressure medicines given how well controlled your blood pressure is and how low it drops while you are at dialysis. We'll check lab work and contact you with the results.

## 2016-07-25 NOTE — Assessment & Plan Note (Signed)
Well-controlled off of medications. I do not think she needs to go back on them at this time given her well-controlled blood pressures and low blood pressures while at dialysis. She'll see her cardiology office today and discuss with them as well.

## 2016-07-25 NOTE — Assessment & Plan Note (Signed)
Check TSH.  Continue Synthroid. 

## 2016-07-25 NOTE — Progress Notes (Signed)
Office Visit    Patient Name: Jamie Arias Date of Encounter: 07/25/2016  Primary Care Provider:  [name], MD Primary Cardiologist:  Jerilynn Mages. Fletcher Anon, MD   Chief Complaint    61 year old female with a history of CAD status post coronary artery bypass grafting 3, diastolic CHF, end-stage renal disease on Tuesday, Thursday, Saturday hemodialysis, diabetes, hypertension, and hyperlipidemia, who presents for follow-up.  Past Medical History    Past Medical History:  Diagnosis Date  . Anemia   . Chronic diastolic CHF (congestive heart failure) ([place])    a. Due to ischemic cardiomyopathy. EF as low as 35%, improved to normal s/p CABG; b. echo 07/06/13: EF 55-60%, no RWMAs, mod TR, trivial pericardial effusion not c/w tamponade physiology;  c. 10/2015 Echo: EF 65%, Gr1 DD, triv AI, mild MR, mildly dil LA, mod TR, PASP 1mHg.  .Marland KitchenCoronary artery disease    a. NSTEMI 06/2013; b.cath: severe three-vessel CAD w/ EF 30% & mild-mod MR; c. s/p 3 vessel CABG 07/02/13 (LIMA-LAD, SVG-OM, and SVG-RPDA);  d. 10/2015 MV: no ischemia/infarct.  . Diabetes mellitus [place] ([place]   . Diabetic neuropathy ([place]   . Dialysis patient [facility]    Tues, Thurs, Sat  . [place] disease) [place]    a. 12/2015 initiated TTS dialysis.  .Marland KitchenGERD (gastroesophageal reflux disease)   . Hyperlipidemia   . Hypertension   . Hypothyroidism   . [place] infarction ([place]  . Neuropathy   . Pleural effusion 2015  . Pulmonary hypertension ([place]   . Wears dentures    full lower   Past Surgical History:  Procedure Laterality Date  . AV FISTULA PLACEMENT Right 02/03/2016   Procedure: INSERTION OF ARTERIOVENOUS (AV) GORE-TEX GRAFT ARM ( BRACH / AXILLARY );  Surgeon: GKatha Cabal MD;  Location: [facility];  Service: Vascular;  Laterality: Right;  . CARDIAC CATHETERIZATION    . CATARACT EXTRACTION Bilateral   . CHOLECYSTECTOMY N/A 12/09/2014   Procedure: LAPAROSCOPIC CHOLECYSTECTOMY;  Surgeon:  CMarlyce [name] MD;  Location: [facility];  Service: General;  Laterality: N/A;  . CORONARY ARTERY BYPASS GRAFT N/A 07/02/2013   Procedure: CORONARY ARTERY BYPASS GRAFTING (CABG);  Surgeon: PIvin [name] MD;  [place]  Service: Open Heart Surgery;  Laterality: N/A;  CABG x three, using left internal mammary artery and right leg greater saphenous vein harvested endoscopically  . ESOPHAGOGASTRODUODENOSCOPY (EGD) WITH PROPOFOL N/A 11/24/2015   Procedure: ESOPHAGOGASTRODUODENOSCOPY (EGD) WITH PROPOFOL;  Surgeon: DLucilla [name] MD;  [place]  Service: Endoscopy;  Laterality: N/A;  Diabetic - insulin  . EYE SURGERY Bilateral    Cataract Extraction with IOL  . INTRAOPERATIVE TRANSESOPHAGEAL ECHOCARDIOGRAM N/A 07/02/2013   Procedure: INTRAOPERATIVE TRANSESOPHAGEAL ECHOCARDIOGRAM;  Surgeon: PIvin [name] MD;  [place]  Service: Open Heart Surgery;  Laterality: N/A;  . PERIPHERAL VASCULAR CATHETERIZATION Right 12/06/2015   Procedure: Dialysis/Perma Catheter Insertion;  Surgeon: GKatha Cabal MD;  [place];  Service: Cardiovascular;  Laterality: Right;  . PORTA CATH REMOVAL N/A 06/01/2016   Procedure: PGlori LuisCath Removal;  Surgeon: GKatha Cabal MD;  [place];  Service: Cardiovascular;  Laterality: N/A;  . THORACENTESIS Left 2015    Allergies  Allergies  Allergen Reactions  . Nsaids Other (See Comments)    Contraindicated due to kidney disease.    History of Present Illness    61year old female with the above complex past medical history including  coronary artery disease status post non-STEMI and coronary artery bypass grafting 3 in April 2015. EF was depressed prior to bypass but subsequently normalized and was most recently 65% by echo in August 2017. Other history includes hypertension, hyperlipidemia, diabetes, and end-stage renal disease. She has been on Tuesday/Thursday/Saturday dialysis since  October 2017.  She was last seen in clinic in November 2017. Since then, she has done well from a cardiac standpoint. She has not had any chest pain or dyspnea. In the setting of dialysis initiation, she has frequently had low blood pressures and orthostasis and over the course of the past 6+ months, she has come off of all of her antihypertensive medications with the exception of Lasix 20 mg, which she takes on nondialysis days as recommended by nephrology. She is no longer on carvedilol, hydralazine, isosorbide mononitrate, amlodipine, or lisinopril. She does follow her blood pressure at home and does have some recordings with her today. Recordings on both dialysis and nondialysis days are commonly less than 448 mmHg systolic. She has not had any palpitations, PND, dizziness, syncope, edema, or early satiety. She does have some fatigue associated with dialysis days however.  Home Medications    Prior to Admission medications   Medication Sig Start Date End Date Taking? Authorizing Provider  acetaminophen (TYLENOL) 325 MG tablet Take 650 mg by mouth daily as needed for moderate pain or headache.    Yes [provider]  aspirin EC 81 MG tablet Take 81 mg by mouth daily.   Yes [provider]  atorvastatin (LIPITOR) 80 MG tablet Take 1 tablet (80 mg total) by mouth daily. 01/20/16  Yes Wellington Hampshire, MD  blood glucose meter kit and supplies KIT Dispense based on patient and insurance preference. Use up to twice daily. (FOR ICD-10 E11.9) 07/01/15  Yes Doss, Velora Heckler, NP  ferrous sulfate 325 (65 FE) MG tablet Take 325 mg by mouth daily.    Yes [provider]  furosemide (LASIX) 20 MG tablet Take 20 mg by mouth every other day. Non-dialysis days   Yes [provider]  gabapentin (NEURONTIN) 300 MG capsule TAKE 1 CAPSULE BY MOUTH THREE TIMES DAILY 06/22/16  Yes [name], MD  glucose blood (RELION GLUCOSE TEST STRIPS) test strip Test blood glucose twice  daily. Diagnosis code E11.9. 01/25/16  Yes [name], MD  Insulin Glargine (BASAGLAR KWIKPEN) 100 UNIT/ML SOPN Inject 0.07 mLs (7 Units total) into the skin at bedtime. Patient taking differently: Inject 5-7 Units into the skin at bedtime. If blood glucose is around or over 200 inject 7 units, if less inject 5 units 04/23/16  Yes [name], MD  Insulin Pen Needle (GLOBAL EASE INJECT PEN NEEDLES) 31G X 5 MM MISC Inject 0.1 mLs (10 Units total) into the skin at bedtime 01/12/16  Yes [name], MD  levothyroxine (SYNTHROID, LEVOTHROID) 75 MCG tablet Take 1 tablet (75 mcg total) by mouth daily. 05/01/16  Yes [name], MD  lidocaine-prilocaine (EMLA) cream Apply 1 application topically as needed (port access).   Yes [provider]  metoCLOPramide (REGLAN) 5 MG tablet Take 1 tablet (5 mg total) by mouth every 8 (eight) hours as needed for nausea. 01/20/16  Yes [name], MD  nitroGLYCERIN (NITROSTAT) 0.4 MG SL tablet DISSOLVE ONE TABLET UNDER THE TONGUE EVERY 5 MINUTES AS NEEDED FOR CHEST PAIN.  DO NOT EXCEED A TOTAL OF 3 DOSES IN 15 MINUTES 01/18/16  Yes Wellington Hampshire, MD  omeprazole (PRILOSEC) 20 MG capsule Take 40 mg by mouth 2 (two) times daily.   Yes [provider]  oxyCODONE-acetaminophen (ROXICET) 5-325 MG tablet Take 1-2 tablets by mouth every 6 (six) hours as needed for moderate pain or severe pain. 02/03/16  Yes Schnier, Dolores Lory, MD  traZODone (DESYREL) 50 MG tablet TAKE 1 TABLET BY MOUTH EVERY NIGHT AT BEDTIME Patient taking differently: TAKE 1 TABLET BY MOUTH AT BEDTIME AS NEEDED FOR SLEEP 05/24/16  Yes [name], MD    Review of Systems    She has some fatigue associated with dialysis days. She denies chest pain, palpitations, dyspnea, pnd, orthopnea, n, v, dizziness, syncope, edema, weight gain, or early satiety.  All other systems reviewed and are otherwise negative except as noted above.  Physical Exam    VS:   BP (!) 126/54 (BP Location: Left Arm, Patient Position: Sitting, Cuff Size: Normal)   Pulse 67   Ht '5\' 4"'$  (1.626 m)   Wt 185 lb 8 oz (84.1 kg)   BMI 31.84 kg/m  , BMI Body mass index is 31.84 kg/m. GEN: Well nourished, well developed, in no acute distress.  HEENT: normal.  Neck: Supple, no JVD, carotid bruits, or masses. Cardiac: RRR, no murmurs, rubs, or gallops. No clubbing, cyanosis, edema.  Radials/DP/PT 2+ and equal bilaterally.  Respiratory:  Respirations regular and unlabored, clear to auscultation bilaterally. GI: Soft, nontender, nondistended, BS + x 4. MS: no deformity or atrophy. Skin: warm and dry, no rash. Neuro:  Strength and sensation are intact. Psych: Normal affect.  Accessory Clinical Findings    ECG - Sinus rhythm, 67, no acute ST or T changes.  Assessment & Plan    1.  Coronary artery disease: Status post non-STEMI and CABG 3 in April 2015. She had a low risk stress test in August 2017. She's been doing well without chest pain or dyspnea. She remains on aspirin and statin therapy. She is no longer on beta blocker, ACE inhibitor, or nitrate in the setting of low blood pressures related to initiation of dialysis.  2. Chronic diastolic congestive heart failure: Volume now managed by hemodialysis. Blood pressure and heart rate are stable today. As above, she is no longer on antihypertensives in the setting of hypotension and orthostasis associated with dialysis.  3. Hyperlipidemia: LDL was 119 and August 2017. She is on high potency Lipitor therapy. We will need to plan on follow-up lipids and LFTs at return visit, or this can be performed by her primary care provider.  4. Type 2 diabetes mellitus: This is managed by primary care. She is on insulin therapy.  5. End-stage renal disease: On Tuesday/Thursday/Saturday dialysis. She is tolerating this though she does have some fatigue on dialysis days.  6. Disposition: Patient will follow-up with Dr. Fletcher Anon in 6 months or  sooner if necessary. We should plan on fasting lipids and LFTs at follow-up.   Murray Hodgkins, NP 07/25/2016, 11:38 AM

## 2016-07-25 NOTE — Progress Notes (Signed)
  [name], MD Phone: [phone]  Jamie Arias is a 61 y.o. female who presents today for follow-up.  Hypertension: Notes it is typically really well controlled at home. Actually drops low while she is at dialysis. She's not taking amlodipine, Coreg, hydralazine, or lisinopril. Not taking Imdur either. Does take Lasix. No chest pain or shortness breath. No edema.  Hypothyroidism: Taking Synthroid. No weight changes, skin changes, or heat or cold intolerance.  Diabetes: Typically runs in the upper 100s or low 200s. Taking 10 units of glargine nightly. Some polydipsia. No polyuria. No hypoglycemia. Has seen ophthalmology in the last 6 months. She was referred to endocrinology previously though has not gotten set up with them.  PMH: nonsmoker.   ROS see history of present illness  Objective  Physical Exam Vitals:   07/25/16 0920  BP: 118/60  Pulse: 69  Temp: 98.3 F (36.8 C)    BP Readings from Last 3 Encounters:  07/25/16 118/60  06/01/16 (!) 158/70  04/23/16 120/72   Wt Readings from Last 3 Encounters:  07/25/16 186 lb 12.8 oz (84.7 kg)  06/01/16 176 lb (79.8 kg)  04/23/16 185 lb 12.8 oz (84.3 kg)    Physical Exam  Constitutional: No distress.  Cardiovascular: Normal rate, regular rhythm and normal heart sounds.   Fistula in right upper extremity with good bruit  Pulmonary/Chest: Effort normal and breath sounds normal.  Musculoskeletal: She exhibits no edema.  Neurological: She is alert. Gait normal.  Skin: Skin is warm and dry. She is not diaphoretic.     Assessment/Plan: Please see individual problem list.  HTN (hypertension) Well-controlled off of medications. I do not think she needs to go back on them at this time given her well-controlled blood pressures and low blood pressures while at dialysis. She'll see her cardiology office today and discuss with them as well.  Hypothyroid Check TSH. Continue Synthroid.  Diabetes mellitus, type 2  (HCC) Check A1c. Continue insulin glargine. We'll base changes off of A1c.  ESRD on dialysis [facility]) Continue to follow with nephrology.   Orders Placed This Encounter  Procedures  . HgB A1c  . TSH    [name], MD [place]

## 2016-07-25 NOTE — Patient Instructions (Signed)
Follow-Up: Your physician wants you to follow-up in: 6 months with Dr. Arida. You will receive a reminder letter in the mail two months in advance. If you don't receive a letter, please call our office to schedule the follow-up appointment.  It was a pleasure seeing you today here in the office. Please do not hesitate to give us a call back if you have any further questions. [phone]  Azelia Reiger A. RN, BSN     

## 2016-07-25 NOTE — Assessment & Plan Note (Signed)
Check A1c. Continue insulin glargine. We'll base changes off of A1c.

## 2016-07-25 NOTE — Assessment & Plan Note (Signed)
Continue to follow with nephrology

## 2016-07-26 DIAGNOSIS — N186 End stage renal disease: Secondary | ICD-10-CM | POA: Diagnosis not present

## 2016-07-26 DIAGNOSIS — N2581 Secondary hyperparathyroidism of renal origin: Secondary | ICD-10-CM | POA: Diagnosis not present

## 2016-07-26 DIAGNOSIS — E1122 Type 2 diabetes mellitus with diabetic chronic kidney disease: Secondary | ICD-10-CM | POA: Diagnosis not present

## 2016-07-26 DIAGNOSIS — D631 Anemia in chronic kidney disease: Secondary | ICD-10-CM | POA: Diagnosis not present

## 2016-07-28 DIAGNOSIS — E1122 Type 2 diabetes mellitus with diabetic chronic kidney disease: Secondary | ICD-10-CM | POA: Diagnosis not present

## 2016-07-28 DIAGNOSIS — N186 End stage renal disease: Secondary | ICD-10-CM | POA: Diagnosis not present

## 2016-07-28 DIAGNOSIS — D631 Anemia in chronic kidney disease: Secondary | ICD-10-CM | POA: Diagnosis not present

## 2016-07-28 DIAGNOSIS — N2581 Secondary hyperparathyroidism of renal origin: Secondary | ICD-10-CM | POA: Diagnosis not present

## 2016-07-31 ENCOUNTER — Telehealth: Payer: Self-pay

## 2016-07-31 DIAGNOSIS — N2581 Secondary hyperparathyroidism of renal origin: Secondary | ICD-10-CM | POA: Diagnosis not present

## 2016-07-31 DIAGNOSIS — D631 Anemia in chronic kidney disease: Secondary | ICD-10-CM | POA: Diagnosis not present

## 2016-07-31 DIAGNOSIS — Z794 Long term (current) use of insulin: Principal | ICD-10-CM

## 2016-07-31 DIAGNOSIS — E1122 Type 2 diabetes mellitus with diabetic chronic kidney disease: Secondary | ICD-10-CM | POA: Diagnosis not present

## 2016-07-31 DIAGNOSIS — N186 End stage renal disease: Principal | ICD-10-CM

## 2016-07-31 DIAGNOSIS — Z992 Dependence on renal dialysis: Principal | ICD-10-CM

## 2016-07-31 NOTE — Telephone Encounter (Signed)
Referral placed.

## 2016-07-31 NOTE — Telephone Encounter (Signed)
We could have her see Jamie Arias, out pharmacist, in our office or I can refer to endocrinology if she would like. She should increase her insulin to 14 units daily. She should continue to monitor her sugars. If they continue to run elevated they should let us know. If she develops any confusion, persistent nausea or vomiting, or new symptoms she should be evaluated. Thanks.

## 2016-07-31 NOTE — Telephone Encounter (Signed)
Patient would like endo referral, patients husband notified of increased dose

## 2016-07-31 NOTE — Telephone Encounter (Signed)
Patients husband called stating the patients glucose was 377 today at dialysis and has been running in the 200s. Patients insulin was just increased to 12 units. Patient was wondering if she should see endocrinology?

## 2016-08-01 ENCOUNTER — Other Ambulatory Visit: Payer: Self-pay | Admitting: Family Medicine

## 2016-08-02 DIAGNOSIS — N186 End stage renal disease: Secondary | ICD-10-CM | POA: Diagnosis not present

## 2016-08-02 DIAGNOSIS — N2581 Secondary hyperparathyroidism of renal origin: Secondary | ICD-10-CM | POA: Diagnosis not present

## 2016-08-02 DIAGNOSIS — E1122 Type 2 diabetes mellitus with diabetic chronic kidney disease: Secondary | ICD-10-CM | POA: Diagnosis not present

## 2016-08-02 DIAGNOSIS — Z992 Dependence on renal dialysis: Secondary | ICD-10-CM | POA: Diagnosis not present

## 2016-08-02 DIAGNOSIS — D631 Anemia in chronic kidney disease: Secondary | ICD-10-CM | POA: Diagnosis not present

## 2016-08-02 NOTE — Telephone Encounter (Signed)
Last OV 07/25/16 last filled 06/22/16 90 0rf

## 2016-08-07 DIAGNOSIS — E1122 Type 2 diabetes mellitus with diabetic chronic kidney disease: Secondary | ICD-10-CM | POA: Diagnosis not present

## 2016-08-07 DIAGNOSIS — D631 Anemia in chronic kidney disease: Secondary | ICD-10-CM | POA: Diagnosis not present

## 2016-08-07 DIAGNOSIS — N186 End stage renal disease: Secondary | ICD-10-CM | POA: Diagnosis not present

## 2016-08-07 DIAGNOSIS — N2581 Secondary hyperparathyroidism of renal origin: Secondary | ICD-10-CM | POA: Diagnosis not present

## 2016-08-08 DIAGNOSIS — N186 End stage renal disease: Secondary | ICD-10-CM | POA: Diagnosis not present

## 2016-08-08 DIAGNOSIS — E1122 Type 2 diabetes mellitus with diabetic chronic kidney disease: Secondary | ICD-10-CM | POA: Diagnosis not present

## 2016-08-08 DIAGNOSIS — D631 Anemia in chronic kidney disease: Secondary | ICD-10-CM | POA: Diagnosis not present

## 2016-08-08 DIAGNOSIS — N2581 Secondary hyperparathyroidism of renal origin: Secondary | ICD-10-CM | POA: Diagnosis not present

## 2016-08-11 DIAGNOSIS — N186 End stage renal disease: Secondary | ICD-10-CM | POA: Diagnosis not present

## 2016-08-11 DIAGNOSIS — E1122 Type 2 diabetes mellitus with diabetic chronic kidney disease: Secondary | ICD-10-CM | POA: Diagnosis not present

## 2016-08-11 DIAGNOSIS — N2581 Secondary hyperparathyroidism of renal origin: Secondary | ICD-10-CM | POA: Diagnosis not present

## 2016-08-11 DIAGNOSIS — D631 Anemia in chronic kidney disease: Secondary | ICD-10-CM | POA: Diagnosis not present

## 2016-08-14 DIAGNOSIS — E1122 Type 2 diabetes mellitus with diabetic chronic kidney disease: Secondary | ICD-10-CM | POA: Diagnosis not present

## 2016-08-14 DIAGNOSIS — D631 Anemia in chronic kidney disease: Secondary | ICD-10-CM | POA: Diagnosis not present

## 2016-08-14 DIAGNOSIS — N2581 Secondary hyperparathyroidism of renal origin: Secondary | ICD-10-CM | POA: Diagnosis not present

## 2016-08-14 DIAGNOSIS — N186 End stage renal disease: Secondary | ICD-10-CM | POA: Diagnosis not present

## 2016-08-16 DIAGNOSIS — D631 Anemia in chronic kidney disease: Secondary | ICD-10-CM | POA: Diagnosis not present

## 2016-08-16 DIAGNOSIS — E1122 Type 2 diabetes mellitus with diabetic chronic kidney disease: Secondary | ICD-10-CM | POA: Diagnosis not present

## 2016-08-16 DIAGNOSIS — N2581 Secondary hyperparathyroidism of renal origin: Secondary | ICD-10-CM | POA: Diagnosis not present

## 2016-08-16 DIAGNOSIS — N186 End stage renal disease: Secondary | ICD-10-CM | POA: Diagnosis not present

## 2016-08-17 ENCOUNTER — Telehealth: Payer: Self-pay | Admitting: *Deleted

## 2016-08-17 NOTE — Telephone Encounter (Signed)
Please check to see if we have any samples. If so please create a label with the instructions on it for 10 units daily and let me review it and make it available for the patient to pick up. If we do not have any samples please let the patient know. Thanks.

## 2016-08-17 NOTE — Telephone Encounter (Signed)
Patient's husband questioned if samples for basaglar is available  Pt contact [phone]

## 2016-08-17 NOTE — Telephone Encounter (Signed)
Please advise 

## 2016-08-17 NOTE — Telephone Encounter (Signed)
Patient notified that we do not have any samples

## 2016-08-18 DIAGNOSIS — D631 Anemia in chronic kidney disease: Secondary | ICD-10-CM | POA: Diagnosis not present

## 2016-08-18 DIAGNOSIS — N2581 Secondary hyperparathyroidism of renal origin: Secondary | ICD-10-CM | POA: Diagnosis not present

## 2016-08-18 DIAGNOSIS — N186 End stage renal disease: Secondary | ICD-10-CM | POA: Diagnosis not present

## 2016-08-18 DIAGNOSIS — E1122 Type 2 diabetes mellitus with diabetic chronic kidney disease: Secondary | ICD-10-CM | POA: Diagnosis not present

## 2016-08-21 DIAGNOSIS — N2581 Secondary hyperparathyroidism of renal origin: Secondary | ICD-10-CM | POA: Diagnosis not present

## 2016-08-21 DIAGNOSIS — N186 End stage renal disease: Secondary | ICD-10-CM | POA: Diagnosis not present

## 2016-08-21 DIAGNOSIS — E1122 Type 2 diabetes mellitus with diabetic chronic kidney disease: Secondary | ICD-10-CM | POA: Diagnosis not present

## 2016-08-21 DIAGNOSIS — D631 Anemia in chronic kidney disease: Secondary | ICD-10-CM | POA: Diagnosis not present

## 2016-08-23 ENCOUNTER — Other Ambulatory Visit: Payer: Self-pay | Admitting: Cardiovascular Disease

## 2016-08-23 DIAGNOSIS — D631 Anemia in chronic kidney disease: Secondary | ICD-10-CM | POA: Diagnosis not present

## 2016-08-23 DIAGNOSIS — N2581 Secondary hyperparathyroidism of renal origin: Secondary | ICD-10-CM | POA: Diagnosis not present

## 2016-08-23 DIAGNOSIS — E1122 Type 2 diabetes mellitus with diabetic chronic kidney disease: Secondary | ICD-10-CM | POA: Diagnosis not present

## 2016-08-23 DIAGNOSIS — N186 End stage renal disease: Secondary | ICD-10-CM | POA: Diagnosis not present

## 2016-08-25 DIAGNOSIS — N2581 Secondary hyperparathyroidism of renal origin: Secondary | ICD-10-CM | POA: Diagnosis not present

## 2016-08-25 DIAGNOSIS — E1122 Type 2 diabetes mellitus with diabetic chronic kidney disease: Secondary | ICD-10-CM | POA: Diagnosis not present

## 2016-08-25 DIAGNOSIS — D631 Anemia in chronic kidney disease: Secondary | ICD-10-CM | POA: Diagnosis not present

## 2016-08-25 DIAGNOSIS — N186 End stage renal disease: Secondary | ICD-10-CM | POA: Diagnosis not present

## 2016-08-27 ENCOUNTER — Ambulatory Visit (INDEPENDENT_AMBULATORY_CARE_PROVIDER_SITE_OTHER): Payer: Medicare Other | Admitting: Vascular Surgery

## 2016-08-27 ENCOUNTER — Encounter (INDEPENDENT_AMBULATORY_CARE_PROVIDER_SITE_OTHER): Payer: Medicare Other

## 2016-08-30 DIAGNOSIS — N2581 Secondary hyperparathyroidism of renal origin: Secondary | ICD-10-CM | POA: Diagnosis not present

## 2016-08-30 DIAGNOSIS — E1122 Type 2 diabetes mellitus with diabetic chronic kidney disease: Secondary | ICD-10-CM | POA: Diagnosis not present

## 2016-08-30 DIAGNOSIS — D631 Anemia in chronic kidney disease: Secondary | ICD-10-CM | POA: Diagnosis not present

## 2016-08-30 DIAGNOSIS — N186 End stage renal disease: Secondary | ICD-10-CM | POA: Diagnosis not present

## 2016-08-31 ENCOUNTER — Other Ambulatory Visit: Payer: Self-pay | Admitting: Family Medicine

## 2016-09-01 DIAGNOSIS — N186 End stage renal disease: Secondary | ICD-10-CM | POA: Diagnosis not present

## 2016-09-01 DIAGNOSIS — E1122 Type 2 diabetes mellitus with diabetic chronic kidney disease: Secondary | ICD-10-CM | POA: Diagnosis not present

## 2016-09-01 DIAGNOSIS — D631 Anemia in chronic kidney disease: Secondary | ICD-10-CM | POA: Diagnosis not present

## 2016-09-01 DIAGNOSIS — Z992 Dependence on renal dialysis: Secondary | ICD-10-CM | POA: Diagnosis not present

## 2016-09-01 DIAGNOSIS — N2581 Secondary hyperparathyroidism of renal origin: Secondary | ICD-10-CM | POA: Diagnosis not present

## 2016-09-03 ENCOUNTER — Telehealth: Payer: Self-pay | Admitting: *Deleted

## 2016-09-03 ENCOUNTER — Other Ambulatory Visit: Payer: Self-pay | Admitting: Family Medicine

## 2016-09-03 MED ORDER — GABAPENTIN 300 MG PO CAPS: 300.0000 mg | ORAL_CAPSULE | Freq: Three times a day (TID) | ORAL | 0 refills | Status: DC

## 2016-09-03 NOTE — Telephone Encounter (Signed)
Last OV 07/25/16 last filled 08/02/16 90 0rf

## 2016-09-03 NOTE — Telephone Encounter (Signed)
Medication Refill requested for : gabapentin  Pharmacy: [facility] Contact : [phone]

## 2016-09-06 DIAGNOSIS — N186 End stage renal disease: Secondary | ICD-10-CM | POA: Diagnosis not present

## 2016-09-06 DIAGNOSIS — D631 Anemia in chronic kidney disease: Secondary | ICD-10-CM | POA: Diagnosis not present

## 2016-09-06 DIAGNOSIS — N2581 Secondary hyperparathyroidism of renal origin: Secondary | ICD-10-CM | POA: Diagnosis not present

## 2016-09-06 DIAGNOSIS — E1122 Type 2 diabetes mellitus with diabetic chronic kidney disease: Secondary | ICD-10-CM | POA: Diagnosis not present

## 2016-09-07 DIAGNOSIS — E113511 Type 2 diabetes mellitus with proliferative diabetic retinopathy with macular edema, right eye: Secondary | ICD-10-CM | POA: Diagnosis not present

## 2016-09-07 DIAGNOSIS — E113512 Type 2 diabetes mellitus with proliferative diabetic retinopathy with macular edema, left eye: Secondary | ICD-10-CM | POA: Diagnosis not present

## 2016-09-07 MED ORDER — GABAPENTIN 300 MG PO CAPS: 300.0000 mg | ORAL_CAPSULE | Freq: Three times a day (TID) | ORAL | 0 refills | Status: DC

## 2016-09-07 NOTE — Telephone Encounter (Signed)
Pt spouse called back looking for an update, pt is completely out of medication. Looks like we sent to the wrong pharmacy. Please call when completed. Please advise, thank you!  Pharmacy - [facility] [place]

## 2016-09-07 NOTE — Telephone Encounter (Signed)
Resent to correct pharmacy, thanks

## 2016-09-07 NOTE — Addendum Note (Signed)
Addended by: Bevelyn Ngo on: 09/07/2016 11:03 AM   Modules accepted: Orders

## 2016-09-08 DIAGNOSIS — N186 End stage renal disease: Secondary | ICD-10-CM | POA: Diagnosis not present

## 2016-09-08 DIAGNOSIS — N2581 Secondary hyperparathyroidism of renal origin: Secondary | ICD-10-CM | POA: Diagnosis not present

## 2016-09-08 DIAGNOSIS — D631 Anemia in chronic kidney disease: Secondary | ICD-10-CM | POA: Diagnosis not present

## 2016-09-08 DIAGNOSIS — E1122 Type 2 diabetes mellitus with diabetic chronic kidney disease: Secondary | ICD-10-CM | POA: Diagnosis not present

## 2016-09-11 DIAGNOSIS — N2581 Secondary hyperparathyroidism of renal origin: Secondary | ICD-10-CM | POA: Diagnosis not present

## 2016-09-11 DIAGNOSIS — E1122 Type 2 diabetes mellitus with diabetic chronic kidney disease: Secondary | ICD-10-CM | POA: Diagnosis not present

## 2016-09-11 DIAGNOSIS — D631 Anemia in chronic kidney disease: Secondary | ICD-10-CM | POA: Diagnosis not present

## 2016-09-11 DIAGNOSIS — N186 End stage renal disease: Secondary | ICD-10-CM | POA: Diagnosis not present

## 2016-09-13 DIAGNOSIS — D631 Anemia in chronic kidney disease: Secondary | ICD-10-CM | POA: Diagnosis not present

## 2016-09-13 DIAGNOSIS — N2581 Secondary hyperparathyroidism of renal origin: Secondary | ICD-10-CM | POA: Diagnosis not present

## 2016-09-13 DIAGNOSIS — N186 End stage renal disease: Secondary | ICD-10-CM | POA: Diagnosis not present

## 2016-09-13 DIAGNOSIS — E1122 Type 2 diabetes mellitus with diabetic chronic kidney disease: Secondary | ICD-10-CM | POA: Diagnosis not present

## 2016-09-17 DIAGNOSIS — D631 Anemia in chronic kidney disease: Secondary | ICD-10-CM | POA: Diagnosis not present

## 2016-09-17 DIAGNOSIS — N186 End stage renal disease: Secondary | ICD-10-CM | POA: Diagnosis not present

## 2016-09-17 DIAGNOSIS — N2581 Secondary hyperparathyroidism of renal origin: Secondary | ICD-10-CM | POA: Diagnosis not present

## 2016-09-17 DIAGNOSIS — E1122 Type 2 diabetes mellitus with diabetic chronic kidney disease: Secondary | ICD-10-CM | POA: Diagnosis not present

## 2016-09-19 DIAGNOSIS — E1122 Type 2 diabetes mellitus with diabetic chronic kidney disease: Secondary | ICD-10-CM | POA: Diagnosis not present

## 2016-09-19 DIAGNOSIS — N2581 Secondary hyperparathyroidism of renal origin: Secondary | ICD-10-CM | POA: Diagnosis not present

## 2016-09-19 DIAGNOSIS — D631 Anemia in chronic kidney disease: Secondary | ICD-10-CM | POA: Diagnosis not present

## 2016-09-19 DIAGNOSIS — N186 End stage renal disease: Secondary | ICD-10-CM | POA: Diagnosis not present

## 2016-09-21 DIAGNOSIS — N186 End stage renal disease: Secondary | ICD-10-CM | POA: Diagnosis not present

## 2016-09-21 DIAGNOSIS — E1122 Type 2 diabetes mellitus with diabetic chronic kidney disease: Secondary | ICD-10-CM | POA: Diagnosis not present

## 2016-09-21 DIAGNOSIS — N2581 Secondary hyperparathyroidism of renal origin: Secondary | ICD-10-CM | POA: Diagnosis not present

## 2016-09-21 DIAGNOSIS — D631 Anemia in chronic kidney disease: Secondary | ICD-10-CM | POA: Diagnosis not present

## 2016-09-24 DIAGNOSIS — N2581 Secondary hyperparathyroidism of renal origin: Secondary | ICD-10-CM | POA: Diagnosis not present

## 2016-09-24 DIAGNOSIS — D631 Anemia in chronic kidney disease: Secondary | ICD-10-CM | POA: Diagnosis not present

## 2016-09-24 DIAGNOSIS — N186 End stage renal disease: Secondary | ICD-10-CM | POA: Diagnosis not present

## 2016-09-24 DIAGNOSIS — E1122 Type 2 diabetes mellitus with diabetic chronic kidney disease: Secondary | ICD-10-CM | POA: Diagnosis not present

## 2016-09-26 ENCOUNTER — Other Ambulatory Visit (INDEPENDENT_AMBULATORY_CARE_PROVIDER_SITE_OTHER): Payer: Self-pay | Admitting: Vascular Surgery

## 2016-09-26 DIAGNOSIS — E1122 Type 2 diabetes mellitus with diabetic chronic kidney disease: Secondary | ICD-10-CM | POA: Diagnosis not present

## 2016-09-26 DIAGNOSIS — N2581 Secondary hyperparathyroidism of renal origin: Secondary | ICD-10-CM | POA: Diagnosis not present

## 2016-09-26 DIAGNOSIS — N186 End stage renal disease: Secondary | ICD-10-CM | POA: Diagnosis not present

## 2016-09-26 DIAGNOSIS — D631 Anemia in chronic kidney disease: Secondary | ICD-10-CM | POA: Diagnosis not present

## 2016-09-26 DIAGNOSIS — N185 Chronic kidney disease, stage 5: Secondary | ICD-10-CM

## 2016-09-27 ENCOUNTER — Encounter (INDEPENDENT_AMBULATORY_CARE_PROVIDER_SITE_OTHER): Payer: Self-pay

## 2016-09-27 ENCOUNTER — Other Ambulatory Visit (INDEPENDENT_AMBULATORY_CARE_PROVIDER_SITE_OTHER): Payer: Self-pay

## 2016-09-27 ENCOUNTER — Ambulatory Visit (INDEPENDENT_AMBULATORY_CARE_PROVIDER_SITE_OTHER): Payer: Medicare Other | Admitting: Vascular Surgery

## 2016-09-27 ENCOUNTER — Ambulatory Visit (INDEPENDENT_AMBULATORY_CARE_PROVIDER_SITE_OTHER): Payer: Medicare Other

## 2016-09-27 DIAGNOSIS — N185 Chronic kidney disease, stage 5: Secondary | ICD-10-CM

## 2016-09-27 MED ORDER — CEFAZOLIN SODIUM-DEXTROSE 2-4 GM/100ML-% IV SOLN: 2.0000 g | Freq: Once | INTRAVENOUS | Status: DC

## 2016-09-28 ENCOUNTER — Encounter
Admission: RE | Disposition: A | Discharge status: Home / Self Care | Payer: Self-pay | Source: Ambulatory Visit | Attending: Vascular Surgery

## 2016-09-28 ENCOUNTER — Ambulatory Visit
Admission: RE | Admit: 2016-09-28 | Discharge: 2016-09-28 | Disposition: A | Discharge status: Home / Self Care | Payer: Medicare Other | Source: Ambulatory Visit | Attending: Vascular Surgery | Admitting: Vascular Surgery

## 2016-09-28 DIAGNOSIS — I252 Old myocardial infarction: Secondary | ICD-10-CM | POA: Insufficient documentation

## 2016-09-28 DIAGNOSIS — Z9889 Other specified postprocedural states: Secondary | ICD-10-CM | POA: Insufficient documentation

## 2016-09-28 DIAGNOSIS — Z886 Allergy status to analgesic agent status: Secondary | ICD-10-CM | POA: Diagnosis not present

## 2016-09-28 DIAGNOSIS — K219 Gastro-esophageal reflux disease without esophagitis: Secondary | ICD-10-CM | POA: Diagnosis not present

## 2016-09-28 DIAGNOSIS — I255 Ischemic cardiomyopathy: Secondary | ICD-10-CM | POA: Insufficient documentation

## 2016-09-28 DIAGNOSIS — Z9841 Cataract extraction status, right eye: Secondary | ICD-10-CM | POA: Insufficient documentation

## 2016-09-28 DIAGNOSIS — E1122 Type 2 diabetes mellitus with diabetic chronic kidney disease: Secondary | ICD-10-CM | POA: Diagnosis not present

## 2016-09-28 DIAGNOSIS — I272 Pulmonary hypertension, unspecified: Secondary | ICD-10-CM | POA: Insufficient documentation

## 2016-09-28 DIAGNOSIS — Z9049 Acquired absence of other specified parts of digestive tract: Secondary | ICD-10-CM | POA: Insufficient documentation

## 2016-09-28 DIAGNOSIS — I2510 Atherosclerotic heart disease of native coronary artery without angina pectoris: Secondary | ICD-10-CM | POA: Insufficient documentation

## 2016-09-28 DIAGNOSIS — Y832 Surgical operation with anastomosis, bypass or graft as the cause of abnormal reaction of the patient, or of later complication, without mention of misadventure at the time of the procedure: Secondary | ICD-10-CM | POA: Insufficient documentation

## 2016-09-28 DIAGNOSIS — Z8249 Family history of ischemic heart disease and other diseases of the circulatory system: Secondary | ICD-10-CM | POA: Insufficient documentation

## 2016-09-28 DIAGNOSIS — Z951 Presence of aortocoronary bypass graft: Secondary | ICD-10-CM | POA: Diagnosis not present

## 2016-09-28 DIAGNOSIS — Z833 Family history of diabetes mellitus: Secondary | ICD-10-CM | POA: Insufficient documentation

## 2016-09-28 DIAGNOSIS — E039 Hypothyroidism, unspecified: Secondary | ICD-10-CM | POA: Diagnosis not present

## 2016-09-28 DIAGNOSIS — Z9842 Cataract extraction status, left eye: Secondary | ICD-10-CM | POA: Diagnosis not present

## 2016-09-28 DIAGNOSIS — I132 Hypertensive heart and chronic kidney disease with heart failure and with stage 5 chronic kidney disease, or end stage renal disease: Secondary | ICD-10-CM | POA: Insufficient documentation

## 2016-09-28 DIAGNOSIS — D631 Anemia in chronic kidney disease: Secondary | ICD-10-CM | POA: Diagnosis not present

## 2016-09-28 DIAGNOSIS — E785 Hyperlipidemia, unspecified: Secondary | ICD-10-CM | POA: Insufficient documentation

## 2016-09-28 DIAGNOSIS — E1140 Type 2 diabetes mellitus with diabetic neuropathy, unspecified: Secondary | ICD-10-CM | POA: Insufficient documentation

## 2016-09-28 DIAGNOSIS — I5032 Chronic diastolic (congestive) heart failure: Secondary | ICD-10-CM | POA: Diagnosis not present

## 2016-09-28 DIAGNOSIS — Z992 Dependence on renal dialysis: Secondary | ICD-10-CM | POA: Insufficient documentation

## 2016-09-28 DIAGNOSIS — Z801 Family history of malignant neoplasm of trachea, bronchus and lung: Secondary | ICD-10-CM | POA: Diagnosis not present

## 2016-09-28 DIAGNOSIS — I10 Essential (primary) hypertension: Secondary | ICD-10-CM | POA: Diagnosis not present

## 2016-09-28 DIAGNOSIS — T829XXA Unspecified complication of cardiac and vascular prosthetic device, implant and graft, initial encounter: Secondary | ICD-10-CM | POA: Diagnosis not present

## 2016-09-28 DIAGNOSIS — N186 End stage renal disease: Secondary | ICD-10-CM | POA: Insufficient documentation

## 2016-09-28 DIAGNOSIS — T82868A Thrombosis of vascular prosthetic devices, implants and grafts, initial encounter: Secondary | ICD-10-CM | POA: Diagnosis not present

## 2016-09-28 HISTORY — PX: PERIPHERAL VASCULAR THROMBECTOMY: CATH118306

## 2016-09-28 LAB — POTASSIUM (ARMC VASCULAR LAB ONLY): Potassium (ARMC vascular lab): 4.5 (ref 3.5–5.1)

## 2016-09-28 LAB — GLUCOSE, CAPILLARY
Glucose-Capillary: 141 mg/dL — ABNORMAL HIGH (ref 65–99)
Glucose-Capillary: 106 mg/dL — ABNORMAL HIGH (ref 65–99)

## 2016-09-28 SURGERY — PERIPHERAL VASCULAR THROMBECTOMY
Anesthesia: Moderate Sedation | Site: Arm Upper | Laterality: Right

## 2016-09-28 MED ORDER — FENTANYL CITRATE (PF) 100 MCG/2ML IJ SOLN
INTRAMUSCULAR | Status: DC | PRN
  Administered 2016-09-28: 25 ug via INTRAVENOUS
  Administered 2016-09-28: 50 ug via INTRAVENOUS

## 2016-09-28 MED ORDER — FENTANYL CITRATE (PF) 100 MCG/2ML IJ SOLN
INTRAMUSCULAR | Status: AC
  Filled 2016-09-28: qty 2

## 2016-09-28 MED ORDER — MIDAZOLAM HCL 2 MG/2ML IJ SOLN
INTRAMUSCULAR | Status: DC | PRN
  Administered 2016-09-28: 1 mg via INTRAVENOUS
  Administered 2016-09-28: 2 mg via INTRAVENOUS

## 2016-09-28 MED ORDER — LIDOCAINE HCL (PF) 1 % IJ SOLN
INTRAMUSCULAR | Status: AC
  Filled 2016-09-28: qty 30

## 2016-09-28 MED ORDER — HEPARIN (PORCINE) IN NACL 2-0.9 UNIT/ML-% IJ SOLN
INTRAMUSCULAR | Status: AC
  Filled 2016-09-28: qty 1000

## 2016-09-28 MED ORDER — HEPARIN SODIUM (PORCINE) 1000 UNIT/ML IJ SOLN
INTRAMUSCULAR | Status: DC | PRN
  Administered 2016-09-28: 3000 [IU] via INTRAVENOUS

## 2016-09-28 MED ORDER — HEPARIN SODIUM (PORCINE) 1000 UNIT/ML IJ SOLN
INTRAMUSCULAR | Status: AC
Start: 2016-09-28 — End: ?
  Filled 2016-09-28: qty 1

## 2016-09-28 MED ORDER — MIDAZOLAM HCL 5 MG/5ML IJ SOLN
INTRAMUSCULAR | Status: AC
  Filled 2016-09-28: qty 5

## 2016-09-28 MED ORDER — SODIUM CHLORIDE 0.9 % IV SOLN
INTRAVENOUS | Status: DC
  Administered 2016-09-28: 13:00:00 via INTRAVENOUS

## 2016-09-28 MED ORDER — CEFAZOLIN SODIUM-DEXTROSE 1-4 GM/50ML-% IV SOLN
INTRAVENOUS | Status: AC
Start: 2016-09-28 — End: ?
  Filled 2016-09-28: qty 50

## 2016-09-28 SURGICAL SUPPLY — 18 items
BALLN DORADO 8X40X80 (BALLOONS) ×3
BALLOON DORADO 8X40X80 (BALLOONS) ×1 IMPLANT
CATH BEACON 5 .035 40 KMP TP (CATHETERS) ×1 IMPLANT
CATH BEACON 5 .038 40 KMP TP (CATHETERS) ×2
COVER PROBE U/S 5X48 (MISCELLANEOUS) ×3 IMPLANT
DEVICE PRESTO INFLATION (MISCELLANEOUS) ×3 IMPLANT
DRAPE BRACHIAL (DRAPES) ×3 IMPLANT
GLIDEWIRE .035X150 LONG TAPER (WIRE) ×3 IMPLANT
GRAFT STENT FLAIR ENDOVAS 8X40 (Stent) ×3 IMPLANT
KIT THROMB PERC PTD (MISCELLANEOUS) ×3 IMPLANT
NEEDLE ENTRY 21GA 7CM ECHOTIP (NEEDLE) ×3 IMPLANT
PACK ANGIOGRAPHY (CUSTOM PROCEDURE TRAY) ×3 IMPLANT
SET INTRO CAPELLA COAXIAL (SET/KITS/TRAYS/PACK) ×3 IMPLANT
SHEATH BRITE TIP 6FRX5.5 (SHEATH) ×6 IMPLANT
SHEATH BRITE TIP 7FRX5.5 (SHEATH) ×3 IMPLANT
SUT MNCRL AB 4-0 PS2 18 (SUTURE) ×3 IMPLANT
TOWEL OR 17X26 4PK STRL BLUE (TOWEL DISPOSABLE) ×3 IMPLANT
WIRE MAGIC TORQUE 260C (WIRE) ×3 IMPLANT

## 2016-09-28 NOTE — H&P (Signed)
[facility] Admission History & Physical  MRN : [id number]  Jamie Arias is a 61 y.o. (1955-06-30) female who presents with chief complaint of No chief complaint on file. Marland Kitchen  History of Present Illness: I am asked to evaluate the patient by the dialysis center. The patient was sent here because they were unable to cannulate the right brachial axillary graft yesterday. Furthermore the Center states there is no thrill or bruit. The patient states this is the first dialysis run to be missed. This problem is acute in onset and has been present for approximately 2 days. The patient is unaware of any other change.  Patient denies pain or tenderness overlying the access.  There is no pain with dialysis.  The patient denies hand pain or finger pain consistent with steal syndrome.   There have not been any past interventions or declots of this access.  The patient is not chronically hypotensive on dialysis.  Current Facility-Administered Medications  Medication Dose Route Frequency Provider Last Rate Last Dose  . 0.9 %  sodium chloride infusion   Intravenous Continuous Santoria Chason, Dolores Lory, MD 20 mL/hr at 09/28/16 1238    . ceFAZolin (ANCEF) IVPB 2g/100 mL premix  2 g Intravenous Once Chanler Mendonca, Dolores Lory, MD        Past Medical History:  Diagnosis Date  . Anemia   . Chronic diastolic CHF (congestive heart failure) ([place])    a. Due to ischemic cardiomyopathy. EF as low as 35%, improved to normal s/p CABG; b. echo 07/06/13: EF 55-60%, no RWMAs, mod TR, trivial pericardial effusion not c/w tamponade physiology;  c. 10/2015 Echo: EF 65%, Gr1 DD, triv AI, mild MR, mildly dil LA, mod TR, PASP 83mmHg.  Marland Kitchen Coronary artery disease    a. NSTEMI 06/2013; b.cath: severe three-vessel CAD w/ EF 30% & mild-mod MR; c. s/p 3 vessel CABG 07/02/13 (LIMA-LAD, SVG-OM, and SVG-RPDA);  d. 10/2015 MV: no ischemia/infarct.  . Diabetes mellitus without complication ([place])   . Diabetic neuropathy ([place])   .  Dialysis patient [facility])    Tues, Thurs, Sat  . ESRD (end stage renal disease) ([place])    a. 12/2015 initiated TTS dialysis.  Marland Kitchen GERD (gastroesophageal reflux disease)   . Hyperlipidemia   . Hypertension   . Hypothyroidism   . Myocardial infarction ([place]) 2015  . Neuropathy   . Pleural effusion 2015  . Pulmonary hypertension ([place])   . Wears dentures    full lower    Past Surgical History:  Procedure Laterality Date  . AV FISTULA PLACEMENT Right 02/03/2016   Procedure: INSERTION OF ARTERIOVENOUS (AV) GORE-TEX GRAFT ARM ( BRACH / AXILLARY );  Surgeon: Katha Cabal, MD;  Location: [facility];  Service: Vascular;  Laterality: Right;  . CARDIAC CATHETERIZATION    . CATARACT EXTRACTION Bilateral   . CHOLECYSTECTOMY N/A 12/09/2014   Procedure: LAPAROSCOPIC CHOLECYSTECTOMY;  Surgeon: Marlyce [name], MD;  Location: [facility];  Service: General;  Laterality: N/A;  . CORONARY ARTERY BYPASS GRAFT N/A 07/02/2013   Procedure: CORONARY ARTERY BYPASS GRAFTING (CABG);  Surgeon: Ivin [name], MD;  Location: [facility];  Service: Open Heart Surgery;  Laterality: N/A;  CABG x three, using left internal mammary artery and right leg greater saphenous vein harvested endoscopically  . ESOPHAGOGASTRODUODENOSCOPY (EGD) WITH PROPOFOL N/A 11/24/2015   Procedure: ESOPHAGOGASTRODUODENOSCOPY (EGD) WITH PROPOFOL;  Surgeon: [name], MD;  Location: [facility];  Service: Endoscopy;  Laterality: N/A;  Diabetic - insulin  . EYE SURGERY  Bilateral    Cataract Extraction with IOL  . INTRAOPERATIVE TRANSESOPHAGEAL ECHOCARDIOGRAM N/A 07/02/2013   Procedure: INTRAOPERATIVE TRANSESOPHAGEAL ECHOCARDIOGRAM;  Surgeon: Ivin [name], MD;  Location: [facility];  Service: Open Heart Surgery;  Laterality: N/A;  . PERIPHERAL VASCULAR CATHETERIZATION Right 12/06/2015   Procedure: Dialysis/Perma Catheter Insertion;  Surgeon: Katha Cabal, MD;  Location: [facility];  Service: Cardiovascular;  Laterality: Right;  .  PORTA CATH REMOVAL N/A 06/01/2016   Procedure: Glori [name] Cath Removal;  Surgeon: Katha Cabal, MD;  Location: [facility];  Service: Cardiovascular;  Laterality: N/A;  . THORACENTESIS Left 2015    Social History Social History  Substance Use Topics  . Smoking status: Never Smoker  . Smokeless tobacco: Never Used  . Alcohol use No    Family History Family History  Problem Relation Age of Onset  . COPD Mother   . Cancer Mother        Lung  . Pulmonary embolism Father   . Diabetes Father   . Diabetes Paternal Grandfather   . Heart disease Maternal Grandmother   . Colon cancer Neg Hx   . Colon polyps Neg Hx   . Esophageal cancer Neg Hx   . Pancreatic cancer Neg Hx   . Liver disease Neg Hx     No family history of bleeding or clotting disorders, autoimmune disease or porphyria  Allergies  Allergen Reactions  . Nsaids Other (See Comments)    Contraindicated due to kidney disease.     REVIEW OF SYSTEMS (Negative unless checked)  Constitutional: [] Weight loss  [] Fever  [] Chills Cardiac: [] Chest pain   [] Chest pressure   [] Palpitations   [] Shortness of breath when laying flat   [] Shortness of breath at rest   [x] Shortness of breath with exertion. Vascular:  [] Pain in legs with walking   [] Pain in legs at rest   [] Pain in legs when laying flat   [] Claudication   [] Pain in feet when walking  [] Pain in feet at rest  [] Pain in feet when laying flat   [] History of DVT   [] Phlebitis   [] Swelling in legs   [] Varicose veins   [] Non-healing ulcers Pulmonary:   [] Uses home oxygen   [] Productive cough   [] Hemoptysis   [] Wheeze  [] COPD   [] Asthma Neurologic:  [] Dizziness  [] Blackouts   [] Seizures   [] History of stroke   [] History of TIA  [] Aphasia   [] Temporary blindness   [] Dysphagia   [] Weakness or numbness in arms   [] Weakness or numbness in legs Musculoskeletal:  [] Arthritis   [] Joint swelling   [] Joint pain   [] Low back pain Hematologic:  [] Easy bruising  [] Easy bleeding    [] Hypercoagulable state   [] Anemic  [] Hepatitis Gastrointestinal:  [] Blood in stool   [] Vomiting blood  [] Gastroesophageal reflux/heartburn   [] Difficulty swallowing. Genitourinary:  [x] Chronic kidney disease   [] Difficult urination  [] Frequent urination  [] Burning with urination   [] Blood in urine Skin:  [] Rashes   [] Ulcers   [] Wounds Psychological:  [] History of anxiety   []  History of major depression.  Physical Examination  Vitals:   09/28/16 1216  BP: (!) 157/93  Pulse: (!) 54  Resp: 20  Temp: 98.7 F (37.1 C)  TempSrc: Oral  SpO2: 99%  Weight: 83.9 kg (185 lb)  Height: 5\' 4"  (1.626 m)   Body mass index is 31.76 kg/m. Gen: WD/WN, NAD Head: [place]/AT, No temporalis wasting. Prominent temp pulse not noted. Ear/Nose/Throat: Hearing grossly intact, nares w/o erythema or  drainage, oropharynx w/o Erythema/Exudate,  Eyes: Conjunctiva clear, sclera non-icteric Neck: Trachea midline.  No JVD.  Pulmonary:  Good air movement, respirations not labored, no use of accessory muscles.  Cardiac: RRR, normal S1, S2. Vascular: right brachial axillary graft no thrill no bruit Vessel Right Left  Radial Palpable Palpable  Ulnar Not Palpable Not Palpable  Brachial Palpable Palpable  Carotid Palpable, without bruit Palpable, without bruit  Gastrointestinal: soft, non-tender/non-distended. No guarding/reflex.  Musculoskeletal: M/S 5/5 throughout.  Extremities without ischemic changes.  No deformity or atrophy.  Neurologic: Sensation grossly intact in extremities.  Symmetrical.  Speech is fluent. Motor exam as listed above. Psychiatric: Judgment intact, Mood & affect appropriate for pt's clinical situation. Dermatologic: No rashes or ulcers noted.  No cellulitis or open wounds. Lymph : No Cervical, Axillary, or Inguinal lymphadenopathy.   CBC Lab Results  Component Value Date   WBC 5.7 01/30/2016   HGB 11.2 (L) 02/03/2016   HCT 33.0 (L) 02/03/2016   MCV 87.1 01/30/2016   PLT 174 01/30/2016     BMET    Component Value Date/Time   NA 138 02/03/2016 0958   NA 140 03/30/2014 1611   K 3.1 (L) 02/03/2016 0958   K 4.3 03/30/2014 1611   CL 101 01/30/2016 1507   CL 105 03/30/2014 1611   CO2 25 01/30/2016 1507   CO2 29 03/30/2014 1611   GLUCOSE 122 (H) 02/03/2016 0958   GLUCOSE 187 (H) 03/30/2014 1611   BUN 30 (H) 01/30/2016 1507   BUN 21 (H) 03/30/2014 1611   CREATININE 3.53 (H) 01/30/2016 1507   CREATININE 3.14 (H) 09/09/2015 1534   CALCIUM 8.2 (L) 01/30/2016 1507   CALCIUM 9.0 03/30/2014 1611   GFRNONAA 13 (L) 01/30/2016 1507   GFRNONAA 36 (L) 03/30/2014 1611   GFRNONAA >60 07/23/2013 0741   GFRAA 15 (L) 01/30/2016 1507   GFRAA 44 (L) 03/30/2014 1611   GFRAA >60 07/23/2013 0741   CrCl cannot be calculated (Patient's most recent lab result is older than the maximum 21 days allowed.).  COAG Lab Results  Component Value Date   INR 0.99 01/30/2016   INR 1.19 07/13/2013   INR 1.27 07/12/2013    Radiology No results found.  Assessment/Plan 1.  Complication dialysis device with thrombosis AV access:  Patient's right arm AV graft is thrombosed. The patient will undergo thrombectomy using interventional techniques.  The risks and benefits were described to the patient.  All questions were answered.  The patient agrees to proceed with angiography and intervention. Potassium will be drawn to ensure that it is an appropriate level prior to performing thrombectomy. 2.  End-stage renal disease requiring hemodialysis:  Patient will continue dialysis therapy without further interruption if a successful thrombectomy is not achieved then catheter will be placed. Dialysis has already been arranged since the patient missed their previous session 3.  Hypertension:  Patient will continue medical management; nephrology is following no changes in oral medications. 4. Diabetes mellitus:  Glucose will be monitored and oral medications been held this morning once the patient has undergone  the patient's procedure po intake will be reinitiated and again Accu-Cheks will be used to assess the blood glucose level and treat as needed. The patient will be restarted on the patient's usual hypoglycemic regime 5.  Coronary artery disease:  EKG will be monitored. Nitrates will be used if needed. The patient's oral cardiac medications will be continued.    Hortencia Pilar, MD  09/28/2016 2:30 PM

## 2016-09-28 NOTE — Discharge Instructions (Signed)

## 2016-09-28 NOTE — Op Note (Addendum)
OPERATIVE NOTE   PROCEDURE: 1. Contrast injection right arm brachial axillary  AV access 2. Percutaneous transluminal angioplasty and stent placement right arm brachial axillary AV graft at the venous anastomosis with an 8 x 40 flared flair stent 3. Mechanical thrombectomy right arm brachial axillary dialysis graft with the Trerotola device  PRE-OPERATIVE DIAGNOSIS: Complication of dialysis access                                                       End Stage Renal Disease  POST-OPERATIVE DIAGNOSIS: same as above   SURGEON: Katha Cabal, M.D.  ANESTHESIA: Conscious sedation was administered under my direct supervision by the interventional radiology RN. IV Versed plus fentanyl were utilized. Continuous ECG, pulse oximetry and blood pressure was monitored throughout the entire procedure.  Conscious sedation was for a total of 42 minutes.  ESTIMATED BLOOD LOSS: minimal  FINDING(S): 1. Thrombus within the graft with a high-grade stricture at the venous anastomosis  SPECIMEN(S):  None  CONTRAST: 35 cc  FLUOROSCOPY TIME: 3.5 minutes  INDICATIONS: Jamie Arias is a 61 y.o. female who  presents with malfunctioning right brachial axillary AV access.  The patient is scheduled for angiography with possible intervention of the AV access.  The patient is aware the risks include but are not limited to: bleeding, infection, thrombosis of the cannulated access, and possible anaphylactic reaction to the contrast.  The patient acknowledges if the access can not be salvaged a tunneled catheter will be needed and will be placed during this procedure.  The patient is aware of the risks of the procedure and elects to proceed with the angiogram and intervention.  DESCRIPTION: After full informed written consent was obtained, the patient was brought back to the Special Procedure suite and placed supine position.  Appropriate cardiopulmonary monitors were placed.  The right arm was prepped and  draped in the standard fashion.  Appropriate timeout is called. The right brachial axillary AV graft  was cannulated with a micropuncture needle.  Cannulation was performed with ultrasound guidance. Ultrasound was placed in a sterile sleeve, the AV access was interrogated and noted to be echolucent and compressible indicating patency. Image was recorded for the permanent record. The puncture is performed under continuous ultrasound visualization.   The microwire was advanced and the needle was exchanged for  a microsheath.  The J-wire was then advanced and a 6 Fr sheath inserted.  Hand injections were completed to image the access from the arterial anastomosis through the entire access.  The central venous structures were also imaged by hand injections.  Based on the images with thrombus noted in the graft and the central venous anatomy widely patent,  3000 units of heparin was given and allowed circulate for several minutes.  The Trerotola device was then prepped on the field multiple passes were made through the antegrade sheath clearing the thrombus from the venous half of the graft. Follow-up imaging demonstrated resolution of nearly all the thrombus as well as uncovering the stricture at the venous anastomosis.  A second 6 French sheath was prepared the ultrasound was returned to the field and used to interrogate the graft more proximally on the arm. 1% lidocaine was treated a microneedle was then inserted into the AV graft under direct ultrasound visualization. Image was recorded for the permanent record. In  the puncture was made in real-time ultrasound guidance. Microwire followed micro-sheath was then inserted followed by the J-wire and the 6 [place] sheath. Kumpe catheter and Glidewire were then negotiated into the brachial artery hand injection contrast was used to demonstrate the anatomy and the location of the anastomosis and subsequently the Trerotola device was reintroduced and mechanical  thrombectomy was performed of the arterial half of the AV graft. After several passes there was complete resolution of the grafts thrombus and there was now forward flow.   The venous outflow stricture at the venous anastomosis was now addressed and a wire was negotiated through the strictures within the venous portion of the graft.  An 8 mm x 40 mm flared flair stent was deployed across the stenoses and postdilated with an 8 mm [place] balloon.  Follow-up imaging demonstrates complete resolution of the stricture with rapid flow of contrast through the graft, the central venous anatomy is preserved.  A 4-0 Monocryl purse-string suture was sewn around the sheath.  The sheath was removed and light pressure was applied.  A sterile bandage was applied to the puncture site.    COMPLICATIONS: None  CONDITION: Jamie Arias, M.D [facility] Office: [phone]  09/28/2016 3:46 PM

## 2016-09-29 DIAGNOSIS — N186 End stage renal disease: Secondary | ICD-10-CM | POA: Diagnosis not present

## 2016-09-29 DIAGNOSIS — E1122 Type 2 diabetes mellitus with diabetic chronic kidney disease: Secondary | ICD-10-CM | POA: Diagnosis not present

## 2016-09-29 DIAGNOSIS — N2581 Secondary hyperparathyroidism of renal origin: Secondary | ICD-10-CM | POA: Diagnosis not present

## 2016-09-29 DIAGNOSIS — D631 Anemia in chronic kidney disease: Secondary | ICD-10-CM | POA: Diagnosis not present

## 2016-10-01 ENCOUNTER — Encounter: Payer: Self-pay | Admitting: Vascular Surgery

## 2016-10-01 DIAGNOSIS — N2581 Secondary hyperparathyroidism of renal origin: Secondary | ICD-10-CM | POA: Diagnosis not present

## 2016-10-01 DIAGNOSIS — D631 Anemia in chronic kidney disease: Secondary | ICD-10-CM | POA: Diagnosis not present

## 2016-10-01 DIAGNOSIS — E1122 Type 2 diabetes mellitus with diabetic chronic kidney disease: Secondary | ICD-10-CM | POA: Diagnosis not present

## 2016-10-01 DIAGNOSIS — N186 End stage renal disease: Secondary | ICD-10-CM | POA: Diagnosis not present

## 2016-10-02 ENCOUNTER — Other Ambulatory Visit: Payer: Self-pay | Admitting: Family Medicine

## 2016-10-02 DIAGNOSIS — Z992 Dependence on renal dialysis: Secondary | ICD-10-CM | POA: Diagnosis not present

## 2016-10-02 DIAGNOSIS — N186 End stage renal disease: Secondary | ICD-10-CM | POA: Diagnosis not present

## 2016-10-03 DIAGNOSIS — D631 Anemia in chronic kidney disease: Secondary | ICD-10-CM | POA: Diagnosis not present

## 2016-10-03 DIAGNOSIS — N2581 Secondary hyperparathyroidism of renal origin: Secondary | ICD-10-CM | POA: Diagnosis not present

## 2016-10-03 DIAGNOSIS — N186 End stage renal disease: Secondary | ICD-10-CM | POA: Diagnosis not present

## 2016-10-03 DIAGNOSIS — E1122 Type 2 diabetes mellitus with diabetic chronic kidney disease: Secondary | ICD-10-CM | POA: Diagnosis not present

## 2016-10-05 DIAGNOSIS — N186 End stage renal disease: Secondary | ICD-10-CM | POA: Diagnosis not present

## 2016-10-05 DIAGNOSIS — E1122 Type 2 diabetes mellitus with diabetic chronic kidney disease: Secondary | ICD-10-CM | POA: Diagnosis not present

## 2016-10-05 DIAGNOSIS — N2581 Secondary hyperparathyroidism of renal origin: Secondary | ICD-10-CM | POA: Diagnosis not present

## 2016-10-05 DIAGNOSIS — D631 Anemia in chronic kidney disease: Secondary | ICD-10-CM | POA: Diagnosis not present

## 2016-10-10 ENCOUNTER — Other Ambulatory Visit: Payer: Self-pay | Admitting: Family Medicine

## 2016-10-10 DIAGNOSIS — N186 End stage renal disease: Secondary | ICD-10-CM | POA: Diagnosis not present

## 2016-10-10 DIAGNOSIS — E1122 Type 2 diabetes mellitus with diabetic chronic kidney disease: Secondary | ICD-10-CM | POA: Diagnosis not present

## 2016-10-10 DIAGNOSIS — N2581 Secondary hyperparathyroidism of renal origin: Secondary | ICD-10-CM | POA: Diagnosis not present

## 2016-10-10 DIAGNOSIS — D631 Anemia in chronic kidney disease: Secondary | ICD-10-CM | POA: Diagnosis not present

## 2016-10-11 ENCOUNTER — Telehealth: Payer: Self-pay

## 2016-10-11 DIAGNOSIS — N2581 Secondary hyperparathyroidism of renal origin: Secondary | ICD-10-CM | POA: Diagnosis not present

## 2016-10-11 DIAGNOSIS — N186 End stage renal disease: Secondary | ICD-10-CM | POA: Diagnosis not present

## 2016-10-11 DIAGNOSIS — E1122 Type 2 diabetes mellitus with diabetic chronic kidney disease: Secondary | ICD-10-CM | POA: Diagnosis not present

## 2016-10-11 DIAGNOSIS — D631 Anemia in chronic kidney disease: Secondary | ICD-10-CM | POA: Diagnosis not present

## 2016-10-11 MED ORDER — GABAPENTIN 300 MG PO CAPS: 300.0000 mg | ORAL_CAPSULE | Freq: Three times a day (TID) | ORAL | 0 refills | Status: DC

## 2016-10-11 NOTE — Telephone Encounter (Signed)
LMOM for patient Rx sent to [facility]

## 2016-10-11 NOTE — Telephone Encounter (Signed)
Sent to pharmacy 

## 2016-10-11 NOTE — Telephone Encounter (Signed)
Patient husband Jamie Arias requesting refill on Gabapentin 300 mg to be  send to pharmacy. Patient is scheduled to see Dr. Caryl Bis 11/27/2016 at 10 am f/u  Patient is out of medication

## 2016-10-12 DIAGNOSIS — E1122 Type 2 diabetes mellitus with diabetic chronic kidney disease: Secondary | ICD-10-CM | POA: Diagnosis not present

## 2016-10-12 DIAGNOSIS — N2581 Secondary hyperparathyroidism of renal origin: Secondary | ICD-10-CM | POA: Diagnosis not present

## 2016-10-12 DIAGNOSIS — N186 End stage renal disease: Secondary | ICD-10-CM | POA: Diagnosis not present

## 2016-10-12 DIAGNOSIS — D631 Anemia in chronic kidney disease: Secondary | ICD-10-CM | POA: Diagnosis not present

## 2016-10-12 MED ORDER — GABAPENTIN 300 MG PO CAPS: 300.0000 mg | ORAL_CAPSULE | Freq: Three times a day (TID) | ORAL | 0 refills | Status: DC

## 2016-10-12 NOTE — Telephone Encounter (Signed)
Sent to [facility]

## 2016-10-12 NOTE — Telephone Encounter (Signed)
Please have this script sent over to [facility] on [address]

## 2016-10-12 NOTE — Addendum Note (Signed)
Addended by: Juanda Chance on: 10/12/2016 09:10 AM   Modules accepted: Orders

## 2016-10-15 DIAGNOSIS — D631 Anemia in chronic kidney disease: Secondary | ICD-10-CM | POA: Diagnosis not present

## 2016-10-15 DIAGNOSIS — N186 End stage renal disease: Secondary | ICD-10-CM | POA: Diagnosis not present

## 2016-10-15 DIAGNOSIS — E1122 Type 2 diabetes mellitus with diabetic chronic kidney disease: Secondary | ICD-10-CM | POA: Diagnosis not present

## 2016-10-15 DIAGNOSIS — N2581 Secondary hyperparathyroidism of renal origin: Secondary | ICD-10-CM | POA: Diagnosis not present

## 2016-10-17 DIAGNOSIS — D631 Anemia in chronic kidney disease: Secondary | ICD-10-CM | POA: Diagnosis not present

## 2016-10-17 DIAGNOSIS — N186 End stage renal disease: Secondary | ICD-10-CM | POA: Diagnosis not present

## 2016-10-17 DIAGNOSIS — E1122 Type 2 diabetes mellitus with diabetic chronic kidney disease: Secondary | ICD-10-CM | POA: Diagnosis not present

## 2016-10-17 DIAGNOSIS — N2581 Secondary hyperparathyroidism of renal origin: Secondary | ICD-10-CM | POA: Diagnosis not present

## 2016-10-19 DIAGNOSIS — D631 Anemia in chronic kidney disease: Secondary | ICD-10-CM | POA: Diagnosis not present

## 2016-10-19 DIAGNOSIS — N2581 Secondary hyperparathyroidism of renal origin: Secondary | ICD-10-CM | POA: Diagnosis not present

## 2016-10-19 DIAGNOSIS — N186 End stage renal disease: Secondary | ICD-10-CM | POA: Diagnosis not present

## 2016-10-19 DIAGNOSIS — E1122 Type 2 diabetes mellitus with diabetic chronic kidney disease: Secondary | ICD-10-CM | POA: Diagnosis not present

## 2016-10-22 DIAGNOSIS — N2581 Secondary hyperparathyroidism of renal origin: Secondary | ICD-10-CM | POA: Diagnosis not present

## 2016-10-22 DIAGNOSIS — E1122 Type 2 diabetes mellitus with diabetic chronic kidney disease: Secondary | ICD-10-CM | POA: Diagnosis not present

## 2016-10-22 DIAGNOSIS — N186 End stage renal disease: Secondary | ICD-10-CM | POA: Diagnosis not present

## 2016-10-22 DIAGNOSIS — D631 Anemia in chronic kidney disease: Secondary | ICD-10-CM | POA: Diagnosis not present

## 2016-10-24 DIAGNOSIS — D631 Anemia in chronic kidney disease: Secondary | ICD-10-CM | POA: Diagnosis not present

## 2016-10-24 DIAGNOSIS — E1122 Type 2 diabetes mellitus with diabetic chronic kidney disease: Secondary | ICD-10-CM | POA: Diagnosis not present

## 2016-10-24 DIAGNOSIS — N2581 Secondary hyperparathyroidism of renal origin: Secondary | ICD-10-CM | POA: Diagnosis not present

## 2016-10-24 DIAGNOSIS — N186 End stage renal disease: Secondary | ICD-10-CM | POA: Diagnosis not present

## 2016-10-26 DIAGNOSIS — D631 Anemia in chronic kidney disease: Secondary | ICD-10-CM | POA: Diagnosis not present

## 2016-10-26 DIAGNOSIS — N2581 Secondary hyperparathyroidism of renal origin: Secondary | ICD-10-CM | POA: Diagnosis not present

## 2016-10-26 DIAGNOSIS — E1122 Type 2 diabetes mellitus with diabetic chronic kidney disease: Secondary | ICD-10-CM | POA: Diagnosis not present

## 2016-10-26 DIAGNOSIS — N186 End stage renal disease: Secondary | ICD-10-CM | POA: Diagnosis not present

## 2016-10-29 DIAGNOSIS — N186 End stage renal disease: Secondary | ICD-10-CM | POA: Diagnosis not present

## 2016-10-29 DIAGNOSIS — N2581 Secondary hyperparathyroidism of renal origin: Secondary | ICD-10-CM | POA: Diagnosis not present

## 2016-10-29 DIAGNOSIS — E1122 Type 2 diabetes mellitus with diabetic chronic kidney disease: Secondary | ICD-10-CM | POA: Diagnosis not present

## 2016-10-29 DIAGNOSIS — D631 Anemia in chronic kidney disease: Secondary | ICD-10-CM | POA: Diagnosis not present

## 2016-10-31 DIAGNOSIS — E1122 Type 2 diabetes mellitus with diabetic chronic kidney disease: Secondary | ICD-10-CM | POA: Diagnosis not present

## 2016-10-31 DIAGNOSIS — N186 End stage renal disease: Secondary | ICD-10-CM | POA: Diagnosis not present

## 2016-10-31 DIAGNOSIS — D631 Anemia in chronic kidney disease: Secondary | ICD-10-CM | POA: Diagnosis not present

## 2016-10-31 DIAGNOSIS — N2581 Secondary hyperparathyroidism of renal origin: Secondary | ICD-10-CM | POA: Diagnosis not present

## 2016-11-02 DIAGNOSIS — N2581 Secondary hyperparathyroidism of renal origin: Secondary | ICD-10-CM | POA: Diagnosis not present

## 2016-11-02 DIAGNOSIS — E1122 Type 2 diabetes mellitus with diabetic chronic kidney disease: Secondary | ICD-10-CM | POA: Diagnosis not present

## 2016-11-02 DIAGNOSIS — N186 End stage renal disease: Secondary | ICD-10-CM | POA: Diagnosis not present

## 2016-11-02 DIAGNOSIS — D631 Anemia in chronic kidney disease: Secondary | ICD-10-CM | POA: Diagnosis not present

## 2016-11-02 DIAGNOSIS — Z992 Dependence on renal dialysis: Secondary | ICD-10-CM | POA: Diagnosis not present

## 2016-11-04 ENCOUNTER — Other Ambulatory Visit: Payer: Self-pay | Admitting: Family Medicine

## 2016-11-05 DIAGNOSIS — D631 Anemia in chronic kidney disease: Secondary | ICD-10-CM | POA: Diagnosis not present

## 2016-11-05 DIAGNOSIS — N186 End stage renal disease: Secondary | ICD-10-CM | POA: Diagnosis not present

## 2016-11-05 DIAGNOSIS — N2581 Secondary hyperparathyroidism of renal origin: Secondary | ICD-10-CM | POA: Diagnosis not present

## 2016-11-05 DIAGNOSIS — Z23 Encounter for immunization: Secondary | ICD-10-CM | POA: Diagnosis not present

## 2016-11-06 ENCOUNTER — Other Ambulatory Visit (INDEPENDENT_AMBULATORY_CARE_PROVIDER_SITE_OTHER): Payer: Self-pay | Admitting: Vascular Surgery

## 2016-11-06 DIAGNOSIS — N185 Chronic kidney disease, stage 5: Secondary | ICD-10-CM

## 2016-11-07 DIAGNOSIS — Z23 Encounter for immunization: Secondary | ICD-10-CM | POA: Diagnosis not present

## 2016-11-07 DIAGNOSIS — N2581 Secondary hyperparathyroidism of renal origin: Secondary | ICD-10-CM | POA: Diagnosis not present

## 2016-11-07 DIAGNOSIS — N186 End stage renal disease: Secondary | ICD-10-CM | POA: Diagnosis not present

## 2016-11-07 DIAGNOSIS — D631 Anemia in chronic kidney disease: Secondary | ICD-10-CM | POA: Diagnosis not present

## 2016-11-08 ENCOUNTER — Encounter (INDEPENDENT_AMBULATORY_CARE_PROVIDER_SITE_OTHER): Payer: Medicare Other

## 2016-11-09 ENCOUNTER — Other Ambulatory Visit: Payer: Self-pay | Admitting: Family Medicine

## 2016-11-09 DIAGNOSIS — Z23 Encounter for immunization: Secondary | ICD-10-CM | POA: Diagnosis not present

## 2016-11-09 DIAGNOSIS — N2581 Secondary hyperparathyroidism of renal origin: Secondary | ICD-10-CM | POA: Diagnosis not present

## 2016-11-09 DIAGNOSIS — N186 End stage renal disease: Secondary | ICD-10-CM | POA: Diagnosis not present

## 2016-11-09 DIAGNOSIS — D631 Anemia in chronic kidney disease: Secondary | ICD-10-CM | POA: Diagnosis not present

## 2016-11-13 ENCOUNTER — Ambulatory Visit (INDEPENDENT_AMBULATORY_CARE_PROVIDER_SITE_OTHER): Payer: Medicare Other

## 2016-11-13 DIAGNOSIS — N185 Chronic kidney disease, stage 5: Secondary | ICD-10-CM | POA: Diagnosis not present

## 2016-11-14 DIAGNOSIS — N2581 Secondary hyperparathyroidism of renal origin: Secondary | ICD-10-CM | POA: Diagnosis not present

## 2016-11-14 DIAGNOSIS — D631 Anemia in chronic kidney disease: Secondary | ICD-10-CM | POA: Diagnosis not present

## 2016-11-14 DIAGNOSIS — N186 End stage renal disease: Secondary | ICD-10-CM | POA: Diagnosis not present

## 2016-11-14 DIAGNOSIS — Z23 Encounter for immunization: Secondary | ICD-10-CM | POA: Diagnosis not present

## 2016-11-14 DIAGNOSIS — E1122 Type 2 diabetes mellitus with diabetic chronic kidney disease: Secondary | ICD-10-CM | POA: Diagnosis not present

## 2016-11-16 DIAGNOSIS — D631 Anemia in chronic kidney disease: Secondary | ICD-10-CM | POA: Diagnosis not present

## 2016-11-16 DIAGNOSIS — N2581 Secondary hyperparathyroidism of renal origin: Secondary | ICD-10-CM | POA: Diagnosis not present

## 2016-11-16 DIAGNOSIS — N186 End stage renal disease: Secondary | ICD-10-CM | POA: Diagnosis not present

## 2016-11-16 DIAGNOSIS — Z23 Encounter for immunization: Secondary | ICD-10-CM | POA: Diagnosis not present

## 2016-11-19 DIAGNOSIS — D631 Anemia in chronic kidney disease: Secondary | ICD-10-CM | POA: Diagnosis not present

## 2016-11-19 DIAGNOSIS — N2581 Secondary hyperparathyroidism of renal origin: Secondary | ICD-10-CM | POA: Diagnosis not present

## 2016-11-19 DIAGNOSIS — N186 End stage renal disease: Secondary | ICD-10-CM | POA: Diagnosis not present

## 2016-11-19 DIAGNOSIS — Z23 Encounter for immunization: Secondary | ICD-10-CM | POA: Diagnosis not present

## 2016-11-21 DIAGNOSIS — D631 Anemia in chronic kidney disease: Secondary | ICD-10-CM | POA: Diagnosis not present

## 2016-11-21 DIAGNOSIS — Z23 Encounter for immunization: Secondary | ICD-10-CM | POA: Diagnosis not present

## 2016-11-21 DIAGNOSIS — N2581 Secondary hyperparathyroidism of renal origin: Secondary | ICD-10-CM | POA: Diagnosis not present

## 2016-11-21 DIAGNOSIS — N186 End stage renal disease: Secondary | ICD-10-CM | POA: Diagnosis not present

## 2016-11-23 DIAGNOSIS — N186 End stage renal disease: Secondary | ICD-10-CM | POA: Diagnosis not present

## 2016-11-23 DIAGNOSIS — Z23 Encounter for immunization: Secondary | ICD-10-CM | POA: Diagnosis not present

## 2016-11-23 DIAGNOSIS — D631 Anemia in chronic kidney disease: Secondary | ICD-10-CM | POA: Diagnosis not present

## 2016-11-23 DIAGNOSIS — N2581 Secondary hyperparathyroidism of renal origin: Secondary | ICD-10-CM | POA: Diagnosis not present

## 2016-11-26 DIAGNOSIS — N186 End stage renal disease: Secondary | ICD-10-CM | POA: Diagnosis not present

## 2016-11-26 DIAGNOSIS — Z23 Encounter for immunization: Secondary | ICD-10-CM | POA: Diagnosis not present

## 2016-11-26 DIAGNOSIS — D631 Anemia in chronic kidney disease: Secondary | ICD-10-CM | POA: Diagnosis not present

## 2016-11-26 DIAGNOSIS — N2581 Secondary hyperparathyroidism of renal origin: Secondary | ICD-10-CM | POA: Diagnosis not present

## 2016-11-27 ENCOUNTER — Ambulatory Visit: Payer: Medicare Other | Admitting: Family Medicine

## 2016-11-27 ENCOUNTER — Other Ambulatory Visit: Payer: Self-pay | Admitting: Family Medicine

## 2016-11-28 DIAGNOSIS — Z23 Encounter for immunization: Secondary | ICD-10-CM | POA: Diagnosis not present

## 2016-11-28 DIAGNOSIS — N186 End stage renal disease: Secondary | ICD-10-CM | POA: Diagnosis not present

## 2016-11-28 DIAGNOSIS — D631 Anemia in chronic kidney disease: Secondary | ICD-10-CM | POA: Diagnosis not present

## 2016-11-28 DIAGNOSIS — N2581 Secondary hyperparathyroidism of renal origin: Secondary | ICD-10-CM | POA: Diagnosis not present

## 2016-11-29 ENCOUNTER — Encounter (INDEPENDENT_AMBULATORY_CARE_PROVIDER_SITE_OTHER): Payer: Self-pay | Admitting: Vascular Surgery

## 2016-11-29 ENCOUNTER — Ambulatory Visit (INDEPENDENT_AMBULATORY_CARE_PROVIDER_SITE_OTHER): Payer: Medicare Other | Admitting: Vascular Surgery

## 2016-11-29 VITALS — BP 138/65 | HR 65 | Resp 17 | Ht 64.0 in | Wt 200.0 lb

## 2016-11-29 DIAGNOSIS — T829XXS Unspecified complication of cardiac and vascular prosthetic device, implant and graft, sequela: Secondary | ICD-10-CM

## 2016-11-29 DIAGNOSIS — I10 Essential (primary) hypertension: Secondary | ICD-10-CM | POA: Diagnosis not present

## 2016-11-29 DIAGNOSIS — E785 Hyperlipidemia, unspecified: Secondary | ICD-10-CM

## 2016-11-29 DIAGNOSIS — N186 End stage renal disease: Secondary | ICD-10-CM | POA: Diagnosis not present

## 2016-11-29 DIAGNOSIS — I25709 Atherosclerosis of coronary artery bypass graft(s), unspecified, with unspecified angina pectoris: Secondary | ICD-10-CM | POA: Diagnosis not present

## 2016-11-29 DIAGNOSIS — I2510 Atherosclerotic heart disease of native coronary artery without angina pectoris: Secondary | ICD-10-CM | POA: Diagnosis not present

## 2016-11-29 DIAGNOSIS — Z992 Dependence on renal dialysis: Secondary | ICD-10-CM | POA: Diagnosis not present

## 2016-11-29 DIAGNOSIS — I209 Angina pectoris, unspecified: Secondary | ICD-10-CM

## 2016-11-29 NOTE — Progress Notes (Signed)
MRN : [id number]  Jamie Arias is a 61 y.o. (01-Sep-1955) female who presents with chief complaint of  Chief Complaint  Patient presents with  . Follow-up    3 week f/u  .  History of Present Illness: The patient returns to the office for followup of their dialysis access.    PROCEDURE done on 09/28/2016: 1. Contrast injection right arm brachial axillary  AV access 2. Percutaneous transluminal angioplasty and stent placement right arm brachial axillary AV graft at the venous anastomosis with an 8 x 40 flared flair stent 3. Mechanical thrombectomy right arm brachial axillary dialysis graft with the Trerotola device  The function of the access has been stable. The patient denies increased bleeding time or increased recirculation. Patient denies difficulty with cannulation. The patient denies hand pain or other symptoms consistent with steal phenomena.  No significant arm swelling.  The patient denies redness or swelling at the access site. The patient denies fever or chills at home or while on dialysis.  The patient denies amaurosis fugax or recent TIA symptoms. There are no recent neurological changes noted. The patient denies claudication symptoms or rest pain symptoms. The patient denies history of DVT, PE or superficial thrombophlebitis. The patient denies recent episodes of angina or shortness of breath.   Current Meds  Medication Sig  . acetaminophen (TYLENOL) 325 MG tablet Take 650 mg by mouth daily as needed for moderate pain or headache.   Marland Kitchen amiodarone (PACERONE) 200 MG tablet Take 200 mg by mouth daily.  Marland Kitchen aspirin EC 81 MG tablet Take 81 mg by mouth daily.  Marland Kitchen atorvastatin (LIPITOR) 80 MG tablet TAKE 1 TABLET BY MOUTH EVERY DAY  . blood glucose meter kit and supplies KIT Dispense based on patient and insurance preference. Use up to twice daily. (FOR ICD-10 E11.9)  . ferrous sulfate 325 (65 FE) MG tablet Take 325 mg by mouth daily.   . furosemide (LASIX) 20 MG tablet Take  20 mg by mouth every other day. Non-dialysis days  . gabapentin (NEURONTIN) 300 MG capsule TAKE 1 CAPSULE(300 MG) BY MOUTH THREE TIMES DAILY  . glucose blood (RELION GLUCOSE TEST STRIPS) test strip Test blood glucose twice daily. Diagnosis code E11.9.  . Insulin Glargine (BASAGLAR KWIKPEN) 100 UNIT/ML SOPN INJECT 7 UNITS UNDER THE SKIN AT BEDTIME  . Insulin Pen Needle (GLOBAL EASE INJECT PEN NEEDLES) 31G X 5 MM MISC Inject 0.1 mLs (10 Units total) into the skin at bedtime  . levothyroxine (SYNTHROID, LEVOTHROID) 75 MCG tablet Take 1 tablet (75 mcg total) by mouth daily.  Marland Kitchen lidocaine-prilocaine (EMLA) cream Apply 1 application topically as needed (port access).  . metoCLOPramide (REGLAN) 5 MG tablet Take 1 tablet (5 mg total) by mouth every 8 (eight) hours as needed for nausea.  . nitroGLYCERIN (NITROSTAT) 0.4 MG SL tablet DISSOLVE ONE TABLET UNDER THE TONGUE EVERY 5 MINUTES AS NEEDED FOR CHEST PAIN.  DO NOT EXCEED A TOTAL OF 3 DOSES IN 15 MINUTES  . omeprazole (PRILOSEC) 20 MG capsule Take 40 mg by mouth 2 (two) times daily.  Marland Kitchen oxyCODONE-acetaminophen (ROXICET) 5-325 MG tablet Take 1-2 tablets by mouth every 6 (six) hours as needed for moderate pain or severe pain.  . traZODone (DESYREL) 50 MG tablet TAKE 1 TABLET BY MOUTH EVERY NIGHT AT BEDTIME    Past Medical History:  Diagnosis Date  . Anemia   . Chronic diastolic CHF (congestive heart failure) ([place])    a. Due to ischemic cardiomyopathy. EF as low  as 35%, improved to normal s/p CABG; b. echo 07/06/13: EF 55-60%, no RWMAs, mod TR, trivial pericardial effusion not c/w tamponade physiology;  c. 10/2015 Echo: EF 65%, Gr1 DD, triv AI, mild MR, mildly dil LA, mod TR, PASP 52mHg.  .Marland KitchenCoronary artery disease    a. NSTEMI 06/2013; b.cath: severe three-vessel CAD w/ EF 30% & mild-mod MR; c. s/p 3 vessel CABG 07/02/13 (LIMA-LAD, SVG-OM, and SVG-RPDA);  d. 10/2015 MV: no ischemia/infarct.  . Diabetes mellitus without complication ([place]   . Diabetic neuropathy  ([place]   . D[facility] patient ([facility]    Tues, Thurs, Sat  . ESRD (end stage renal disease) ([place]    a. 12/2015 initiated TTS dialysis.  .Marland KitchenGERD (gastroesophageal reflux disease)   . Hyperlipidemia   . Hypertension   . Hypothyroidism   . Myocardial infarction ([place] 2015  . Neuropathy   . Pleural effusion 2015  . Pulmonary hypertension ([place]   . Wears dentures    full lower    Past Surgical History:  Procedure Laterality Date  . AV FISTULA PLACEMENT Right 02/03/2016   Procedure: INSERTION OF ARTERIOVENOUS (AV) GORE-TEX GRAFT ARM ( BRACH / AXILLARY );  Surgeon: GKatha Cabal MD;  Location: [facility] ORS;  Service: Vascular;  Laterality: Right;  . CARDIAC CATHETERIZATION    . CATARACT EXTRACTION Bilateral   . CHOLECYSTECTOMY N/A 12/09/2014   Procedure: LAPAROSCOPIC CHOLECYSTECTOMY;  Surgeon: CMarlyce [name] MD;  Location: [facility] ORS;  Service: General;  Laterality: N/A;  . CORONARY ARTERY BYPASS GRAFT N/A 07/02/2013   Procedure: CORONARY ARTERY BYPASS GRAFTING (CABG);  Surgeon: PIvin [name] MD;  [place] [facility]  Service: Open Heart Surgery;  Laterality: N/A;  CABG x three, using left internal mammary artery and right leg greater saphenous vein harvested endoscopically  . ESOPHAGOGASTRODUODENOSCOPY (EGD) WITH PROPOFOL N/A 11/24/2015   Procedure: ESOPHAGOGASTRODUODENOSCOPY (EGD) WITH PROPOFOL;  Surgeon: DLucilla [name] MD;  [place] [facility]  Service: Endoscopy;  Laterality: N/A;  Diabetic - insulin  . EYE SURGERY Bilateral    Cataract Extraction with IOL  . INTRAOPERATIVE TRANSESOPHAGEAL ECHOCARDIOGRAM N/A 07/02/2013   Procedure: INTRAOPERATIVE TRANSESOPHAGEAL ECHOCARDIOGRAM;  Surgeon: PIvin [name] MD;  [place] [facility]  Service: Open Heart Surgery;  Laterality: N/A;  . PERIPHERAL VASCULAR CATHETERIZATION Right 12/06/2015   Procedure: Dialysis/Perma Catheter Insertion;  Surgeon: GKatha Cabal MD;  [place] [facility];  Service: Cardiovascular;  Laterality:  Right;  . PERIPHERAL VASCULAR THROMBECTOMY Right 09/28/2016   Procedure: Peripheral Vascular Thrombectomy;  Surgeon: SKatha Cabal MD;  [place] [facility];  Service: Cardiovascular;  Laterality: Right;  . PORTA CATH REMOVAL N/A 06/01/2016   Procedure: PGlori LuisCath Removal;  Surgeon: GKatha Cabal MD;  [place] [facility];  Service: Cardiovascular;  Laterality: N/A;  . THORACENTESIS Left 2015    Social History Social History  Substance Use Topics  . Smoking status: Never Smoker  . Smokeless tobacco: Never Used  . Alcohol use No    Family History Family History  Problem Relation Age of Onset  . COPD Mother   . Cancer Mother        Lung  . Pulmonary embolism Father   . Diabetes Father   . Diabetes Paternal Grandfather   . Heart disease Maternal Grandmother   . Colon cancer Neg Hx   . Colon polyps Neg Hx   . Esophageal cancer Neg Hx   . Pancreatic cancer Neg Hx   . Liver disease Neg Hx  Allergies  Allergen Reactions  . Nsaids Other (See Comments)    Contraindicated due to kidney disease.     REVIEW OF SYSTEMS (Negative unless checked)  Constitutional: '[]'$ Weight loss  '[]'$ Fever  '[]'$ Chills Cardiac: '[]'$ Chest pain   '[]'$ Chest pressure   '[]'$ Palpitations   '[]'$ Shortness of breath when laying flat   '[]'$ Shortness of breath with exertion. Vascular:  '[]'$ Pain in legs with walking   '[]'$ Pain in legs at rest  '[]'$ History of DVT   '[]'$ Phlebitis   '[]'$ Swelling in legs   '[]'$ Varicose veins   '[]'$ Non-healing ulcers Pulmonary:   '[]'$ Uses home oxygen   '[]'$ Productive cough   '[]'$ Hemoptysis   '[]'$ Wheeze  '[]'$ COPD   '[]'$ Asthma Neurologic:  '[]'$ Dizziness   '[]'$ Seizures   '[]'$ History of stroke   '[]'$ History of TIA  '[]'$ Aphasia   '[]'$ Vissual changes   '[]'$ Weakness or numbness in arm   '[]'$ Weakness or numbness in leg Musculoskeletal:   '[]'$ Joint swelling   '[]'$ Joint pain   '[]'$ Low back pain Hematologic:  '[]'$ Easy bruising  '[]'$ Easy bleeding   '[]'$ Hypercoagulable state   '[]'$ Anemic Gastrointestinal:  '[]'$ Diarrhea   '[]'$ Vomiting   '[]'$ Gastroesophageal reflux/heartburn   '[]'$ Difficulty swallowing. Genitourinary:  '[x]'$ Chronic kidney disease   '[]'$ Difficult urination  '[]'$ Frequent urination   '[]'$ Blood in urine Skin:  '[]'$ Rashes   '[]'$ Ulcers  Psychological:  '[]'$ History of anxiety   '[]'$  History of major depression.  Physical Examination  Vitals:   11/29/16 1335  BP: 138/65  Pulse: 65  Resp: 17  Weight: 200 lb (90.7 kg)  Height: '5\' 4"'$  (1.626 m)   Body mass index is 34.33 kg/m. Gen: WD/WN, NAD Head: [place]/AT, No temporalis wasting.  Ear/Nose/Throat: Hearing grossly intact, nares w/o erythema or drainage Eyes: PER, EOMI, sclera nonicteric.  Neck: Supple, no large masses.   Pulmonary:  Good air movement, no audible wheezing bilaterally, no use of accessory muscles.  Cardiac: RRR, no JVD Vascular:  Left brachial axillary AV graft good thrill good bruit Vessel Right Left  Radial Palpable Palpable  Ulnar Palpable Palpable  Brachial Palpable Palpable  Gastrointestinal: Non-distended. No guarding/no peritoneal signs.  Musculoskeletal: M/S 5/5 throughout.  No deformity or atrophy.  Neurologic: CN 2-12 intact. Symmetrical.  Speech is fluent. Motor exam as listed above. Psychiatric: Judgment intact, Mood & affect appropriate for pt's clinical situation. Dermatologic: No rashes or ulcers noted.  No changes consistent with cellulitis. Lymph : No lichenification or skin changes of chronic lymphedema.  CBC Lab Results  Component Value Date   WBC 5.7 01/30/2016   HGB 11.2 (L) 02/03/2016   HCT 33.0 (L) 02/03/2016   MCV 87.1 01/30/2016   PLT 174 01/30/2016    BMET    Component Value Date/Time   NA 138 02/03/2016 0958   NA 140 03/30/2014 1611   K 3.1 (L) 02/03/2016 0958   K 4.3 03/30/2014 1611   CL 101 01/30/2016 1507   CL 105 03/30/2014 1611   CO2 25 01/30/2016 1507   CO2 29 03/30/2014 1611   GLUCOSE 122 (H) 02/03/2016 0958   GLUCOSE 187 (H) 03/30/2014 1611   BUN 30 (H) 01/30/2016 1507   BUN 21 (H) 03/30/2014 1611    CREATININE 3.53 (H) 01/30/2016 1507   CREATININE 3.14 (H) 09/09/2015 1534   CALCIUM 8.2 (L) 01/30/2016 1507   CALCIUM 9.0 03/30/2014 1611   GFRNONAA 13 (L) 01/30/2016 1507   GFRNONAA 36 (L) 03/30/2014 1611   GFRNONAA >60 07/23/2013 0741   GFRAA 15 (L) 01/30/2016 1507   GFRAA 44 (L) 03/30/2014 1611   GFRAA >60 07/23/2013 6045  CrCl cannot be calculated (Patient's most recent lab result is older than the maximum 21 days allowed.).  COAG Lab Results  Component Value Date   INR 0.99 01/30/2016   INR 1.19 07/13/2013   INR 1.27 07/12/2013    Radiology No results found.  Assessment/Plan  1. ESRD on dialysis [facility]) Continue dialysis without interuption  2. Hyperlipidemia, unspecified hyperlipidemia type Continue statin as ordered and reviewed, no changes at this time   3. Essential hypertension Continue antihypertensive medications as already ordered, these medications have been reviewed and there are no changes at this time.   4. Coronary artery disease involving coronary bypass graft of native heart with angina pectoris ([place]) Continue cardiac and antihypertensive medications as already ordered and reviewed, no changes at this time.  Continue statin as ordered and reviewed, no changes at this time  Nitrates PRN for chest pain   5. Complication from renal dialysis device, sequela Recommend:  The patient is doing well and currently has adequate dialysis access. The patient's dialysis center is not reporting any access issues. Flow pattern is stable when compared to the prior ultrasound.  The patient should have a duplex ultrasound of the dialysis access in 3 months. The patient will follow-up with me in the office after each ultrasound    - VAS [place] (AVF, AVG); Future    Hortencia Pilar, MD  11/29/2016 1:49 PM

## 2016-11-30 DIAGNOSIS — N2581 Secondary hyperparathyroidism of renal origin: Secondary | ICD-10-CM | POA: Diagnosis not present

## 2016-11-30 DIAGNOSIS — Z23 Encounter for immunization: Secondary | ICD-10-CM | POA: Diagnosis not present

## 2016-11-30 DIAGNOSIS — D631 Anemia in chronic kidney disease: Secondary | ICD-10-CM | POA: Diagnosis not present

## 2016-11-30 DIAGNOSIS — N186 End stage renal disease: Secondary | ICD-10-CM | POA: Diagnosis not present

## 2016-12-01 DIAGNOSIS — T829XXA Unspecified complication of cardiac and vascular prosthetic device, implant and graft, initial encounter: Secondary | ICD-10-CM | POA: Insufficient documentation

## 2016-12-02 DIAGNOSIS — N186 End stage renal disease: Secondary | ICD-10-CM | POA: Diagnosis not present

## 2016-12-02 DIAGNOSIS — Z992 Dependence on renal dialysis: Secondary | ICD-10-CM | POA: Diagnosis not present

## 2016-12-03 ENCOUNTER — Telehealth: Payer: Self-pay | Admitting: *Deleted

## 2016-12-03 DIAGNOSIS — N2581 Secondary hyperparathyroidism of renal origin: Secondary | ICD-10-CM | POA: Diagnosis not present

## 2016-12-03 DIAGNOSIS — E1122 Type 2 diabetes mellitus with diabetic chronic kidney disease: Secondary | ICD-10-CM | POA: Diagnosis not present

## 2016-12-03 DIAGNOSIS — D631 Anemia in chronic kidney disease: Secondary | ICD-10-CM | POA: Diagnosis not present

## 2016-12-03 DIAGNOSIS — D509 Iron deficiency anemia, unspecified: Secondary | ICD-10-CM | POA: Diagnosis not present

## 2016-12-03 DIAGNOSIS — N186 End stage renal disease: Secondary | ICD-10-CM | POA: Diagnosis not present

## 2016-12-03 MED ORDER — BASAGLAR KWIKPEN 100 UNIT/ML ~~LOC~~ SOPN: PEN_INJECTOR | SUBCUTANEOUS | 0 refills | Status: DC

## 2016-12-03 NOTE — Telephone Encounter (Signed)
Refill sent to pharmacy. Patient needs follow-up. Thanks.

## 2016-12-03 NOTE — Telephone Encounter (Signed)
Pt will need a medication refill for Jamie Arias

## 2016-12-03 NOTE — Telephone Encounter (Signed)
Last OV 07/25/16 last filled 11/28/16 15 0rf

## 2016-12-05 DIAGNOSIS — N2581 Secondary hyperparathyroidism of renal origin: Secondary | ICD-10-CM | POA: Diagnosis not present

## 2016-12-05 DIAGNOSIS — N186 End stage renal disease: Secondary | ICD-10-CM | POA: Diagnosis not present

## 2016-12-05 DIAGNOSIS — D509 Iron deficiency anemia, unspecified: Secondary | ICD-10-CM | POA: Diagnosis not present

## 2016-12-05 DIAGNOSIS — D631 Anemia in chronic kidney disease: Secondary | ICD-10-CM | POA: Diagnosis not present

## 2016-12-05 DIAGNOSIS — E1122 Type 2 diabetes mellitus with diabetic chronic kidney disease: Secondary | ICD-10-CM | POA: Diagnosis not present

## 2016-12-06 DIAGNOSIS — D509 Iron deficiency anemia, unspecified: Secondary | ICD-10-CM | POA: Diagnosis not present

## 2016-12-06 DIAGNOSIS — D631 Anemia in chronic kidney disease: Secondary | ICD-10-CM | POA: Diagnosis not present

## 2016-12-06 DIAGNOSIS — N186 End stage renal disease: Secondary | ICD-10-CM | POA: Diagnosis not present

## 2016-12-06 DIAGNOSIS — N2581 Secondary hyperparathyroidism of renal origin: Secondary | ICD-10-CM | POA: Diagnosis not present

## 2016-12-06 DIAGNOSIS — E1122 Type 2 diabetes mellitus with diabetic chronic kidney disease: Secondary | ICD-10-CM | POA: Diagnosis not present

## 2016-12-06 MED ORDER — BASAGLAR KWIKPEN 100 UNIT/ML ~~LOC~~ SOPN: PEN_INJECTOR | SUBCUTANEOUS | 0 refills | Status: DC

## 2016-12-06 NOTE — Telephone Encounter (Signed)
Sent to pharmacy 

## 2016-12-06 NOTE — Telephone Encounter (Signed)
Pt husband called about pt Rx is not at the pharmacy since note on 12/03/2016. Insulin Glargine (BASAGLAR KWIKPEN) 100 UNIT/ML SOPN Please advise?  Pharmacy is [id number] - [place], [place]  Call husband @ [phone]. Thank you!

## 2016-12-07 ENCOUNTER — Other Ambulatory Visit: Payer: Self-pay | Admitting: Family Medicine

## 2016-12-09 ENCOUNTER — Other Ambulatory Visit: Payer: Self-pay | Admitting: Family Medicine

## 2016-12-10 ENCOUNTER — Other Ambulatory Visit: Payer: Self-pay | Admitting: *Deleted

## 2016-12-10 DIAGNOSIS — N186 End stage renal disease: Secondary | ICD-10-CM | POA: Diagnosis not present

## 2016-12-10 DIAGNOSIS — D631 Anemia in chronic kidney disease: Secondary | ICD-10-CM | POA: Diagnosis not present

## 2016-12-10 DIAGNOSIS — N2581 Secondary hyperparathyroidism of renal origin: Secondary | ICD-10-CM | POA: Diagnosis not present

## 2016-12-10 DIAGNOSIS — D509 Iron deficiency anemia, unspecified: Secondary | ICD-10-CM | POA: Diagnosis not present

## 2016-12-10 DIAGNOSIS — E1122 Type 2 diabetes mellitus with diabetic chronic kidney disease: Secondary | ICD-10-CM | POA: Diagnosis not present

## 2016-12-10 NOTE — Telephone Encounter (Signed)
Please see if we have samples of this. Please also see if the patient has seen endocrinology yet as was recommended previously. Please find out what her blood sugars have been running and how much of the insulin glargine she is using. Thanks.

## 2016-12-10 NOTE — Telephone Encounter (Signed)
Patient is scheduled for next month with endo, patients sugar has been 150-200. Patient is supposed to use 14 units a day but has had to drop down to 8 because she was running low

## 2016-12-10 NOTE — Telephone Encounter (Signed)
Pt's spouse has requested samples for pt's insulin -basaglar Pt has a script ready at the pharmacy , however her funds are to low to purchase her script Pt contact [phone]

## 2016-12-10 NOTE — Telephone Encounter (Signed)
Please advise 

## 2016-12-12 DIAGNOSIS — D509 Iron deficiency anemia, unspecified: Secondary | ICD-10-CM | POA: Diagnosis not present

## 2016-12-12 DIAGNOSIS — N186 End stage renal disease: Secondary | ICD-10-CM | POA: Diagnosis not present

## 2016-12-12 DIAGNOSIS — E1122 Type 2 diabetes mellitus with diabetic chronic kidney disease: Secondary | ICD-10-CM | POA: Diagnosis not present

## 2016-12-12 DIAGNOSIS — D631 Anemia in chronic kidney disease: Secondary | ICD-10-CM | POA: Diagnosis not present

## 2016-12-12 DIAGNOSIS — N2581 Secondary hyperparathyroidism of renal origin: Secondary | ICD-10-CM | POA: Diagnosis not present

## 2016-12-12 NOTE — Telephone Encounter (Signed)
Please check and see if we have any samples.

## 2016-12-13 ENCOUNTER — Ambulatory Visit (INDEPENDENT_AMBULATORY_CARE_PROVIDER_SITE_OTHER): Payer: Medicare Other | Admitting: Family Medicine

## 2016-12-13 ENCOUNTER — Encounter: Payer: Self-pay | Admitting: Family Medicine

## 2016-12-13 DIAGNOSIS — E785 Hyperlipidemia, unspecified: Secondary | ICD-10-CM

## 2016-12-13 DIAGNOSIS — I2510 Atherosclerotic heart disease of native coronary artery without angina pectoris: Secondary | ICD-10-CM | POA: Diagnosis not present

## 2016-12-13 DIAGNOSIS — Z992 Dependence on renal dialysis: Secondary | ICD-10-CM | POA: Diagnosis not present

## 2016-12-13 DIAGNOSIS — E1140 Type 2 diabetes mellitus with diabetic neuropathy, unspecified: Secondary | ICD-10-CM | POA: Diagnosis not present

## 2016-12-13 DIAGNOSIS — N186 End stage renal disease: Secondary | ICD-10-CM

## 2016-12-13 DIAGNOSIS — F325 Major depressive disorder, single episode, in full remission: Secondary | ICD-10-CM | POA: Diagnosis not present

## 2016-12-13 MED ORDER — ACCU-CHEK FASTCLIX LANCETS MISC: 3 refills | Status: DC

## 2016-12-13 MED ORDER — GLUCOSE BLOOD VI STRP: ORAL_STRIP | 12 refills | Status: DC

## 2016-12-13 NOTE — Assessment & Plan Note (Signed)
Most recent A1c 7.1. We'll request records. We'll check with our pharmacist to check into assistance for her insulin as cost is an issue.

## 2016-12-13 NOTE — Telephone Encounter (Signed)
We do not have any samples of basaglar

## 2016-12-13 NOTE — Telephone Encounter (Signed)
We will discuss in office.

## 2016-12-13 NOTE — Progress Notes (Signed)
  [name], MD Phone: [phone]  Jamie Arias is a 61 y.o. female who presents today for follow-up.  Diabetes: Blood sugars ranging between 115-200. Taking insulin glargine 14 units daily. Some increased thirst but no polyuria. No hypoglycemia. She is following with ophthalmology. Most recent A1c 7.1.  End-stage renal disease: On dialysis Monday Wednesday Friday. Still urinating. She has had issues with her blood pressure dropping low while at dialysis and they have changed her dry weight. She is also on midodrine. She does not feel lightheaded with this. No nausea. She overall feels quite a bit better on dialysis.  History of depression: She notes no depression or anxiety currently.  PMH: nonsmoker.   ROS see history of present illness  Objective  Physical Exam Vitals:   12/13/16 0952  BP: 130/62  Pulse: 69  Temp: 98.6 F (37 C)  SpO2: 93%    BP Readings from Last 3 Encounters:  12/13/16 130/62  11/29/16 138/65  09/28/16 (!) 128/55   Wt Readings from Last 3 Encounters:  12/13/16 201 lb (91.2 kg)  11/29/16 200 lb (90.7 kg)  09/28/16 185 lb (83.9 kg)    Physical Exam  Constitutional: No distress.  Cardiovascular: Normal rate, regular rhythm and normal heart sounds.   Right upper extremity fistula with good bruit  Pulmonary/Chest: Effort normal and breath sounds normal.  Musculoskeletal: She exhibits no edema.  Neurological: She is alert. Gait normal.  Skin: Skin is warm and dry. She is not diaphoretic.     Assessment/Plan: Please see individual problem list.  Diabetes mellitus, type 2 ([place]) Most recent A1c 7.1. We'll request records. We'll check with our pharmacist to check into assistance for her insulin as cost is an issue.  ESRD on dialysis [facility]) Seems to be doing well on this. She'll continue to follow with nephrology and go to dialysis.  HLD (hyperlipidemia) She reports she has had cholesterol checked recently. We'll request records from her  nephrologist.  Depression Denies depression. Monitor for recurrence.   No orders of the defined types were placed in this encounter.   Meds ordered this encounter  Medications  . glucose blood (CONTOUR NEXT TEST) test strip    Sig: Please check blood glucose twice daily, diagnosis code E11.9    Dispense:  100 each    Refill:  12  . ACCU-CHEK FASTCLIX LANCETS MISC    Sig: Check blood glucose twice daily, diagnosis code E11.9    Dispense:  204 each    Refill:  3    Lancets come in boxes of 102 each. Please dispense 2 boxes. For questions regarding this prescription please call [phone].    [name], MD [place]

## 2016-12-13 NOTE — Patient Instructions (Signed)
Nice to see you. We will contact you once we hear back from our pharmacist.

## 2016-12-13 NOTE — Assessment & Plan Note (Signed)
She reports she has had cholesterol checked recently. We'll request records from her nephrologist.

## 2016-12-13 NOTE — Assessment & Plan Note (Signed)
Seems to be doing well on this. She'll continue to follow with nephrology and go to dialysis.

## 2016-12-13 NOTE — Assessment & Plan Note (Signed)
Denies depression. Monitor for recurrence.

## 2016-12-14 ENCOUNTER — Other Ambulatory Visit (INDEPENDENT_AMBULATORY_CARE_PROVIDER_SITE_OTHER): Payer: Self-pay | Admitting: Vascular Surgery

## 2016-12-14 ENCOUNTER — Emergency Department
Admission: EM | Admit: 2016-12-14 | Discharge: 2016-12-14 | Disposition: A | Discharge status: Other Acute Inpatient Hospital | Payer: Medicare Other

## 2016-12-17 ENCOUNTER — Encounter: Payer: Self-pay | Admitting: Vascular Surgery

## 2016-12-17 ENCOUNTER — Ambulatory Visit
Admission: RE | Admit: 2016-12-17 | Discharge: 2016-12-17 | Disposition: A | Discharge status: Home / Self Care | Payer: Medicare Other | Source: Ambulatory Visit | Attending: Vascular Surgery | Admitting: Vascular Surgery

## 2016-12-17 ENCOUNTER — Encounter
Admission: RE | Disposition: A | Discharge status: Home / Self Care | Payer: Self-pay | Source: Ambulatory Visit | Attending: Vascular Surgery

## 2016-12-17 DIAGNOSIS — Z951 Presence of aortocoronary bypass graft: Secondary | ICD-10-CM | POA: Insufficient documentation

## 2016-12-17 DIAGNOSIS — Y832 Surgical operation with anastomosis, bypass or graft as the cause of abnormal reaction of the patient, or of later complication, without mention of misadventure at the time of the procedure: Secondary | ICD-10-CM | POA: Insufficient documentation

## 2016-12-17 DIAGNOSIS — E785 Hyperlipidemia, unspecified: Secondary | ICD-10-CM | POA: Diagnosis not present

## 2016-12-17 DIAGNOSIS — Z6834 Body mass index (BMI) 34.0-34.9, adult: Secondary | ICD-10-CM | POA: Diagnosis not present

## 2016-12-17 DIAGNOSIS — Z801 Family history of malignant neoplasm of trachea, bronchus and lung: Secondary | ICD-10-CM | POA: Insufficient documentation

## 2016-12-17 DIAGNOSIS — Z9889 Other specified postprocedural states: Secondary | ICD-10-CM | POA: Diagnosis not present

## 2016-12-17 DIAGNOSIS — Z833 Family history of diabetes mellitus: Secondary | ICD-10-CM | POA: Insufficient documentation

## 2016-12-17 DIAGNOSIS — E039 Hypothyroidism, unspecified: Secondary | ICD-10-CM | POA: Diagnosis not present

## 2016-12-17 DIAGNOSIS — T82858A Stenosis of vascular prosthetic devices, implants and grafts, initial encounter: Secondary | ICD-10-CM | POA: Diagnosis not present

## 2016-12-17 DIAGNOSIS — E1122 Type 2 diabetes mellitus with diabetic chronic kidney disease: Secondary | ICD-10-CM | POA: Diagnosis not present

## 2016-12-17 DIAGNOSIS — I272 Pulmonary hypertension, unspecified: Secondary | ICD-10-CM | POA: Diagnosis not present

## 2016-12-17 DIAGNOSIS — I252 Old myocardial infarction: Secondary | ICD-10-CM | POA: Insufficient documentation

## 2016-12-17 DIAGNOSIS — E1140 Type 2 diabetes mellitus with diabetic neuropathy, unspecified: Secondary | ICD-10-CM | POA: Insufficient documentation

## 2016-12-17 DIAGNOSIS — Z886 Allergy status to analgesic agent status: Secondary | ICD-10-CM | POA: Diagnosis not present

## 2016-12-17 DIAGNOSIS — Z992 Dependence on renal dialysis: Secondary | ICD-10-CM | POA: Diagnosis not present

## 2016-12-17 DIAGNOSIS — T82868A Thrombosis of vascular prosthetic devices, implants and grafts, initial encounter: Secondary | ICD-10-CM

## 2016-12-17 DIAGNOSIS — Z9049 Acquired absence of other specified parts of digestive tract: Secondary | ICD-10-CM | POA: Diagnosis not present

## 2016-12-17 DIAGNOSIS — Z9841 Cataract extraction status, right eye: Secondary | ICD-10-CM | POA: Insufficient documentation

## 2016-12-17 DIAGNOSIS — N186 End stage renal disease: Secondary | ICD-10-CM

## 2016-12-17 DIAGNOSIS — Z836 Family history of other diseases of the respiratory system: Secondary | ICD-10-CM | POA: Diagnosis not present

## 2016-12-17 DIAGNOSIS — I25709 Atherosclerosis of coronary artery bypass graft(s), unspecified, with unspecified angina pectoris: Secondary | ICD-10-CM | POA: Insufficient documentation

## 2016-12-17 DIAGNOSIS — Z8249 Family history of ischemic heart disease and other diseases of the circulatory system: Secondary | ICD-10-CM | POA: Insufficient documentation

## 2016-12-17 DIAGNOSIS — Z9842 Cataract extraction status, left eye: Secondary | ICD-10-CM | POA: Diagnosis not present

## 2016-12-17 DIAGNOSIS — I132 Hypertensive heart and chronic kidney disease with heart failure and with stage 5 chronic kidney disease, or end stage renal disease: Secondary | ICD-10-CM | POA: Diagnosis not present

## 2016-12-17 DIAGNOSIS — I5032 Chronic diastolic (congestive) heart failure: Secondary | ICD-10-CM | POA: Diagnosis not present

## 2016-12-17 HISTORY — PX: A/V FISTULAGRAM: CATH118298

## 2016-12-17 LAB — GLUCOSE, CAPILLARY: Glucose-Capillary: 193 mg/dL — ABNORMAL HIGH (ref 65–99)

## 2016-12-17 LAB — POTASSIUM (ARMC VASCULAR LAB ONLY): Potassium (ARMC vascular lab): 5.2 — ABNORMAL HIGH (ref 3.5–5.1)

## 2016-12-17 SURGERY — A/V FISTULAGRAM
Anesthesia: Moderate Sedation | Site: Arm Upper | Laterality: Right

## 2016-12-17 MED ORDER — MIDAZOLAM HCL 2 MG/2ML IJ SOLN
INTRAMUSCULAR | Status: DC | PRN
  Administered 2016-12-17: 2 mg via INTRAVENOUS

## 2016-12-17 MED ORDER — HEPARIN SODIUM (PORCINE) 1000 UNIT/ML IJ SOLN
INTRAMUSCULAR | Status: DC | PRN
  Administered 2016-12-17: 4000 [IU] via INTRAVENOUS

## 2016-12-17 MED ORDER — ONDANSETRON HCL 4 MG/2ML IJ SOLN: 4.0000 mg | Freq: Four times a day (QID) | INTRAMUSCULAR | Status: DC | PRN

## 2016-12-17 MED ORDER — SODIUM CHLORIDE 0.9 % IV SOLN
INTRAVENOUS | Status: DC
  Administered 2016-12-17: 11:00:00 via INTRAVENOUS

## 2016-12-17 MED ORDER — FENTANYL CITRATE (PF) 100 MCG/2ML IJ SOLN
INTRAMUSCULAR | Status: AC
  Filled 2016-12-17: qty 2

## 2016-12-17 MED ORDER — IOPAMIDOL (ISOVUE-300) INJECTION 61%
INTRAVENOUS | Status: DC | PRN
  Administered 2016-12-17: 30 mL via INTRAVENOUS

## 2016-12-17 MED ORDER — LIDOCAINE-EPINEPHRINE (PF) 1 %-1:200000 IJ SOLN
INTRAMUSCULAR | Status: AC
  Filled 2016-12-17: qty 30

## 2016-12-17 MED ORDER — FENTANYL CITRATE (PF) 100 MCG/2ML IJ SOLN
INTRAMUSCULAR | Status: DC | PRN
  Administered 2016-12-17: 50 ug via INTRAVENOUS

## 2016-12-17 MED ORDER — CEFAZOLIN SODIUM-DEXTROSE 1-4 GM/50ML-% IV SOLN
INTRAVENOUS | Status: AC
  Filled 2016-12-17: qty 50

## 2016-12-17 MED ORDER — METHYLPREDNISOLONE SODIUM SUCC 125 MG IJ SOLR: 125.0000 mg | INTRAMUSCULAR | Status: DC | PRN

## 2016-12-17 MED ORDER — MIDAZOLAM HCL 2 MG/2ML IJ SOLN
INTRAMUSCULAR | Status: AC
  Filled 2016-12-17: qty 2

## 2016-12-17 MED ORDER — HYDROMORPHONE HCL 1 MG/ML IJ SOLN: 1.0000 mg | Freq: Once | INTRAMUSCULAR | Status: DC | PRN

## 2016-12-17 MED ORDER — HEPARIN SODIUM (PORCINE) 1000 UNIT/ML IJ SOLN
INTRAMUSCULAR | Status: AC
  Filled 2016-12-17: qty 1

## 2016-12-17 MED ORDER — CEFAZOLIN SODIUM-DEXTROSE 1-4 GM/50ML-% IV SOLN
1.0000 g | Freq: Once | INTRAVENOUS | Status: AC
  Administered 2016-12-17: 1 g via INTRAVENOUS

## 2016-12-17 MED ORDER — FAMOTIDINE 20 MG PO TABS: 40.0000 mg | ORAL_TABLET | ORAL | Status: DC | PRN

## 2016-12-17 SURGICAL SUPPLY — 10 items
BALLN LUTONIX DCB 7X60X130 (BALLOONS) ×3
BALLOON LUTONIX DCB 7X60X130 (BALLOONS) ×1 IMPLANT
CANNULA 5F STIFF (CANNULA) ×3 IMPLANT
COVER PROBE U/S 5X48 (MISCELLANEOUS) ×3 IMPLANT
DEVICE PRESTO INFLATION (MISCELLANEOUS) ×3 IMPLANT
DRAPE BRACHIAL (DRAPES) ×3 IMPLANT
PACK ANGIOGRAPHY (CUSTOM PROCEDURE TRAY) ×3 IMPLANT
SHEATH BRITE TIP 6FRX5.5 (SHEATH) ×3 IMPLANT
SUT MNCRL AB 4-0 PS2 18 (SUTURE) ×3 IMPLANT
WIRE MAGIC TOR.035 180C (WIRE) ×3 IMPLANT

## 2016-12-17 NOTE — Op Note (Signed)
[facility]    OPERATIVE NOTE   PROCEDURE: 1.  Right brachial artery to axillary vein arteriovenous graft cannulation under ultrasound guidance 2.  Right arm shuntogram 3.  Percutaneous transluminal angioplasty of the axillary vein just beyond the previously placed stent with 7 mm diameter by 6 cm length Lutonix drug-coated angioplasty balloon  PRE-OPERATIVE DIAGNOSIS: 1. ESRD 2. Malfunctioning right brachial artery to axillary vein arteriovenous graft  POST-OPERATIVE DIAGNOSIS: same as above   SURGEON: Leotis Pain, MD  ANESTHESIA: local with MCS  ESTIMATED BLOOD LOSS: 3 cc  FINDING(S): 1. Patent right brachial artery to axillary vein AV graft with some narrowing just beyond the previously placed stent into the axillary vein.  The remainder of the central venous circulation was patent.  This had almost a kinked appearance creating about a 60% stenosis although the flow remained reasonably good.    SPECIMEN(S):  None  CONTRAST: 30 cc  FLUORO TIME: 0.9 minutes  MODERATE CONSCIOUS SEDATION TIME:  Approximately 15 minutes using 2 mg of Versed and 50 Mcg of Fentanyl  INDICATIONS: Jamie Arias is a 61 y.o. female who presents with malfunctioning right brachial artery to axillary vein arteriovenous graft.  The patient is scheduled for right arm shuntogram.  The patient is aware the risks include but are not limited to: bleeding, infection, thrombosis of the cannulated access, and possible anaphylactic reaction to the contrast.  The patient is aware of the risks of the procedure and elects to proceed forward.  DESCRIPTION: After full informed written consent was obtained, the patient was brought back to the angiography suite and placed supine upon the angiography table.  The patient was connected to monitoring equipment. Moderate conscious sedation was administered during a face to face encounter throughout the procedure with my supervision of the RN administering  medicines and monitoring the patient's vital signs, pulse oximetry, telemetry and mental status throughout from the start of the procedure until the patient was taken to the recovery room The right arm was prepped and draped in the standard fashion for a percutaneous access intervention.  Under ultrasound guidance, the right brachial artery to axillary vein arteriovenous graft was cannulated with a micropuncture needle under direct ultrasound guidance and a permanent image was performed.  The microwire was advanced into the graft and the needle was exchanged for the a microsheath.  I then upsized to a 6 Fr Sheath and imaging was performed.  Hand injections were completed to image the access including the central venous system. This demonstrated patent right brachial artery to axillary vein AV graft with some narrowing just beyond the previously placed stent into the axillary vein.  The remainder of the central venous circulation was patent.  This had almost a kinked appearance creating about a 60% stenosis although the flow remained reasonably good.  .  Based on the images, and the fact that she had not received adequate dialysis at her last several treatments, I elected to treat the area. I then gave the patient 3000 units of intravenous heparin.  I then crossed the stenosis with a Magic Tourqe wire.  Based on the imaging, a 7 mm x 6 cm Lutonix drug-coated angioplasty balloon was selected.  The balloon was centered around the axillary vein stenosis with the distal extent of the balloon back into the previously placed stent in the proximal extent of the balloon well into the axillary vein and inflated to 12 ATM for 1 minute(s).  On completion imaging, some degree of residual  narrowing from the tortuosity or kinking was present, but the stenosis was improved and only about a 30 % residual stenosis was present.     Based on the completion imaging, no further intervention is necessary.  The wire and balloon were  removed from the sheath.  A 4-0 Monocryl purse-string suture was sewn around the sheath.  The sheath was removed while tying down the suture.  A sterile bandage was applied to the puncture site.  COMPLICATIONS: None  CONDITION: Stable   Leotis Pain  12/17/2016 12:04 PM    This note was created with Dragon Medical transcription system. Any errors in dictation are purely unintentional.

## 2016-12-17 NOTE — H&P (Signed)
[facility] History & Physical Update  The patient was interviewed and re-examined.  The patient's previous History and Physical has been reviewed and is unchanged.  There is no change in the plan of care. We plan to proceed with the scheduled procedure.  Leotis Pain, MD  12/17/2016, 10:23 AM

## 2016-12-18 DIAGNOSIS — N2581 Secondary hyperparathyroidism of renal origin: Secondary | ICD-10-CM | POA: Diagnosis not present

## 2016-12-18 DIAGNOSIS — D631 Anemia in chronic kidney disease: Secondary | ICD-10-CM | POA: Diagnosis not present

## 2016-12-18 DIAGNOSIS — N186 End stage renal disease: Secondary | ICD-10-CM | POA: Diagnosis not present

## 2016-12-18 DIAGNOSIS — D509 Iron deficiency anemia, unspecified: Secondary | ICD-10-CM | POA: Diagnosis not present

## 2016-12-18 DIAGNOSIS — E1122 Type 2 diabetes mellitus with diabetic chronic kidney disease: Secondary | ICD-10-CM | POA: Diagnosis not present

## 2016-12-18 NOTE — Telephone Encounter (Signed)
We can change her and provide her with a sample. Please check to see which insulin samples we have. I will forward to [place] as well to find out what the most affordable insulin for her would be.

## 2016-12-18 NOTE — Telephone Encounter (Signed)
Please advise 

## 2016-12-18 NOTE — Telephone Encounter (Signed)
Pt husband called about the insulin not covered by pt insurance. The pharmacy gave her one insulin for $8.00. Husband wants to know if we have any samples and husband said pt could be switched to a different insulin? Please advise?  Call husband @ [phone]. Thank you!

## 2016-12-19 DIAGNOSIS — N186 End stage renal disease: Secondary | ICD-10-CM | POA: Diagnosis not present

## 2016-12-19 DIAGNOSIS — D631 Anemia in chronic kidney disease: Secondary | ICD-10-CM | POA: Diagnosis not present

## 2016-12-19 DIAGNOSIS — E1122 Type 2 diabetes mellitus with diabetic chronic kidney disease: Secondary | ICD-10-CM | POA: Diagnosis not present

## 2016-12-19 DIAGNOSIS — D509 Iron deficiency anemia, unspecified: Secondary | ICD-10-CM | POA: Diagnosis not present

## 2016-12-19 DIAGNOSIS — N2581 Secondary hyperparathyroidism of renal origin: Secondary | ICD-10-CM | POA: Diagnosis not present

## 2016-12-19 NOTE — Telephone Encounter (Signed)
We have 3 toujeo, 1 tresiba and 4 xultophy

## 2016-12-19 NOTE — Addendum Note (Signed)
Addended by: Caryl Bis, Jyoti Harju G on: 12/19/2016 12:49 PM   Modules accepted: Orders

## 2016-12-19 NOTE — Telephone Encounter (Signed)
We could provide her with a sample of toujeo. I did check with Chrys Racer and she wrote back stating that lantus should be the preferred insulin by the patients insurance. We could send that in as well and see what it costs. Thanks.

## 2016-12-21 DIAGNOSIS — E1122 Type 2 diabetes mellitus with diabetic chronic kidney disease: Secondary | ICD-10-CM | POA: Diagnosis not present

## 2016-12-21 DIAGNOSIS — N186 End stage renal disease: Secondary | ICD-10-CM | POA: Diagnosis not present

## 2016-12-21 DIAGNOSIS — D631 Anemia in chronic kidney disease: Secondary | ICD-10-CM | POA: Diagnosis not present

## 2016-12-21 DIAGNOSIS — N2581 Secondary hyperparathyroidism of renal origin: Secondary | ICD-10-CM | POA: Diagnosis not present

## 2016-12-21 DIAGNOSIS — D509 Iron deficiency anemia, unspecified: Secondary | ICD-10-CM | POA: Diagnosis not present

## 2016-12-21 MED ORDER — INSULIN GLARGINE 100 UNIT/ML SOLOSTAR PEN
14.0000 [IU] | PEN_INJECTOR | Freq: Every day | SUBCUTANEOUS | 1 refills | Status: DC
Start: 2016-12-21 — End: 2016-12-21

## 2016-12-21 MED ORDER — INSULIN GLARGINE 100 UNIT/ML SOLOSTAR PEN: 14.0000 [IU] | PEN_INJECTOR | Freq: Every day | SUBCUTANEOUS | 1 refills | Status: DC

## 2016-12-21 MED ORDER — INSULIN GLARGINE 300 UNIT/ML ~~LOC~~ SOPN: 14.0000 [IU] | PEN_INJECTOR | Freq: Every day | SUBCUTANEOUS | 0 refills | Status: DC

## 2016-12-21 NOTE — Addendum Note (Signed)
Addended by: Juanda Chance on: 12/21/2016 10:23 AM   Modules accepted: Orders

## 2016-12-21 NOTE — Telephone Encounter (Signed)
Patient's husband notified  

## 2016-12-21 NOTE — Telephone Encounter (Addendum)
Toujeo sample order placed. Please create the label and provide to patient when she comes to pick it up.

## 2016-12-21 NOTE — Addendum Note (Signed)
Addended by: Caryl Bis Dixon Luczak G on: 12/21/2016 03:11 PM   Modules accepted: Orders

## 2016-12-21 NOTE — Telephone Encounter (Signed)
Lantus prescription printed. Please fax. Please check with them to see if they want the sample as well. It is a concentrated version so the volume she takes will be different, though the units will be the same. Thanks.

## 2016-12-21 NOTE — Addendum Note (Signed)
Addended by: [name] on: 12/21/2016 10:19 AM   Modules accepted: Orders

## 2016-12-21 NOTE — Telephone Encounter (Signed)
Pt spouse called looking for an update. Please advise, thank you!

## 2016-12-22 ENCOUNTER — Telehealth: Payer: Self-pay | Admitting: Family Medicine

## 2016-12-22 NOTE — Telephone Encounter (Signed)
Called to confirm that patient picked up toujeo sample yesterday. Patient's husband confirmed this. I did advise that there is an Lantus prescription at the pharmacy that may be more affordable than the basaglar she was on previously. I discussed that the toujeo is a more concentrated insulin so the volume of injection will be smaller though she will still continue the 14 units that she has been on. I advised the patient's husband to check with the pharmacy to see the cost of the Lantus. He reports she still about half of her basaglar left. I advised him to call us on Monday to let us know how expensive the Lantus was. I also advised him to call us if they had any questions on how to use the toujeo.

## 2016-12-24 DIAGNOSIS — D631 Anemia in chronic kidney disease: Secondary | ICD-10-CM | POA: Diagnosis not present

## 2016-12-24 DIAGNOSIS — D509 Iron deficiency anemia, unspecified: Secondary | ICD-10-CM | POA: Diagnosis not present

## 2016-12-24 DIAGNOSIS — E1122 Type 2 diabetes mellitus with diabetic chronic kidney disease: Secondary | ICD-10-CM | POA: Diagnosis not present

## 2016-12-24 DIAGNOSIS — N2581 Secondary hyperparathyroidism of renal origin: Secondary | ICD-10-CM | POA: Diagnosis not present

## 2016-12-24 DIAGNOSIS — N186 End stage renal disease: Secondary | ICD-10-CM | POA: Diagnosis not present

## 2016-12-25 ENCOUNTER — Ambulatory Visit (INDEPENDENT_AMBULATORY_CARE_PROVIDER_SITE_OTHER): Payer: Medicare Other | Admitting: Vascular Surgery

## 2016-12-25 ENCOUNTER — Encounter (INDEPENDENT_AMBULATORY_CARE_PROVIDER_SITE_OTHER): Payer: Self-pay | Admitting: Vascular Surgery

## 2016-12-25 ENCOUNTER — Telehealth: Payer: Self-pay | Admitting: Family Medicine

## 2016-12-25 VITALS — BP 126/62 | HR 76 | Resp 16 | Ht 64.0 in | Wt 204.6 lb

## 2016-12-25 DIAGNOSIS — E785 Hyperlipidemia, unspecified: Secondary | ICD-10-CM

## 2016-12-25 DIAGNOSIS — I2510 Atherosclerotic heart disease of native coronary artery without angina pectoris: Secondary | ICD-10-CM | POA: Diagnosis not present

## 2016-12-25 DIAGNOSIS — T829XXS Unspecified complication of cardiac and vascular prosthetic device, implant and graft, sequela: Secondary | ICD-10-CM | POA: Diagnosis not present

## 2016-12-25 DIAGNOSIS — N186 End stage renal disease: Secondary | ICD-10-CM

## 2016-12-25 DIAGNOSIS — Z992 Dependence on renal dialysis: Secondary | ICD-10-CM | POA: Diagnosis not present

## 2016-12-25 NOTE — Progress Notes (Signed)
Subjective:    [name], female    DOB: 30-Jun-1955, 61 y.o.   MRN: [id number] Chief Complaint  Patient presents with  . Follow-up   Patient presents for her first post procedure follow-up. Patient is status post a right brachial artery to axillary vein arteriovenous graft cannulation under ultrasound guidance, right arm shuntogram, percutaneous transluminal angioplasty of the axillary vein just beyond the previously placed stent with 7 mm diameter by 6 cm length Lutonix drug-coated angioplasty balloon on 12/17/16. Patient's dialysis centers states she is "pulling clots". Patient's last dialysis was yesterday for 3 hours. She denies any issues with cannulation or prolonged bleeding. Patient denies any right upper extremity pain. Denies any fever, nausea vomiting.   Review of Systems  Constitutional: Negative.   HENT: Negative.   Eyes: Negative.   Respiratory: Negative.   Cardiovascular: Negative.   Gastrointestinal: Negative.   Endocrine: Negative.   Genitourinary:       End-stage renal disease  Musculoskeletal: Negative.   Skin: Negative.   Allergic/Immunologic: Negative.   Neurological: Negative.   Hematological: Negative.   Psychiatric/Behavioral: Negative.       Objective:   Physical Exam  Constitutional: She appears well-developed and well-nourished. No distress.  HENT:  Head: Normocephalic and atraumatic.  Eyes: Pupils are equal, round, and reactive to light. Conjunctivae are normal.  Neck: Normal range of motion.  Cardiovascular: Normal rate, regular rhythm, normal heart sounds and intact distal pulses.   Right upper extremity: dialysis access with good bruit and thrill. Skin is intact.  Pulmonary/Chest: Effort normal.  Musculoskeletal: Normal range of motion. She exhibits no edema.  Neurological: She is alert.  Skin: Skin is warm and dry. She is not diaphoretic.  Psychiatric: She has a normal mood and affect. Her behavior is normal. Judgment and  thought content normal.  Vitals reviewed.  BP 126/62 (BP Location: Left Arm)   Pulse 76   Resp 16   Ht '5\' 4"'$  (1.626 m)   Wt 204 lb 9.6 oz (92.8 kg)   BMI 35.12 kg/m   Past Medical History:  Diagnosis Date  . Anemia   . Chronic diastolic CHF (congestive heart failure) ([place])    a. Due to ischemic cardiomyopathy. EF as low as 35%, improved to normal s/p CABG; b. echo 07/06/13: EF 55-60%, no RWMAs, mod TR, trivial pericardial effusion not c/w tamponade physiology;  c. 10/2015 Echo: EF 65%, Gr1 DD, triv AI, mild MR, mildly dil LA, mod TR, PASP 41mHg.  .Marland KitchenCoronary artery disease    a. NSTEMI 06/2013; b.cath: severe three-vessel CAD w/ EF 30% & mild-mod MR; c. s/p 3 vessel CABG 07/02/13 (LIMA-LAD, SVG-OM, and SVG-RPDA);  d. 10/2015 MV: no ischemia/infarct.  . Diabetes mellitus without complication ([place]   . Diabetic neuropathy ([place]   . [facility] patient ([facility]    Tues, Thurs, Sat  . ESRD ([place] stage renal disease) ([place]    a. 12/2015 initiated TTS dialysis.  .Marland KitchenGERD (gastroesophageal reflux disease)   . Hyperlipidemia   . Hypertension   . Hypothyroidism   . [place]ocardial infarction ([place] 2015  . Neuropathy   . Pleural effusion 2015  . Pulmonary hypertension ([place]   . Wears dentures    full lower   Social History   Social History  . Marital status: Married    Spouse name: N/A  . Number of children: N/A  . Years of education: N/A   Occupational History  . works for [employer]  History Main Topics  . Smoking status: Never Smoker  . Smokeless tobacco: Never Used  . Alcohol use No  . Drug use: No  . Sexual activity: Not on file   Other Topics Concern  . Not on file   Social History Narrative   Patient lives at home with her husband. Patient has 4 adult children.   Past Surgical History:  Procedure Laterality Date  . A/V FISTULAGRAM Right 12/17/2016   Procedure: A/V Fistulagram;  Surgeon: Algernon Huxley, MD;  Location: [place] [facility];  Service: Cardiovascular;   Laterality: Right;  . AV FISTULA PLACEMENT Right 02/03/2016   Procedure: INSERTION OF ARTERIOVENOUS (AV) GORE-TEX GRAFT ARM ( BRACH / AXILLARY );  Surgeon: Katha Cabal, MD;  Location: [facility];  Service: Vascular;  Laterality: Right;  . CARDIAC CATHETERIZATION    . CATARACT EXTRACTION Bilateral   . CHOLECYSTECTOMY N/A 12/09/2014   Procedure: LAPAROSCOPIC CHOLECYSTECTOMY;  Surgeon: Marlyce [name], MD;  Location: [facility];  Service: General;  Laterality: N/A;  . CORONARY ARTERY BYPASS GRAFT N/A 07/02/2013   Procedure: CORONARY ARTERY BYPASS GRAFTING (CABG);  Surgeon: Ivin [name], MD;  Location: [place];  Service: Open Heart Surgery;  Laterality: N/A;  CABG x three, using left internal mammary artery and right leg greater saphenous vein harvested endoscopically  . ESOPHAGOGASTRODUODENOSCOPY (EGD) WITH PROPOFOL N/A 11/24/2015   Procedure: ESOPHAGOGASTRODUODENOSCOPY (EGD) WITH PROPOFOL;  Surgeon: [name], MD;  Location: [facility];  Service: Endoscopy;  Laterality: N/A;  Diabetic - insulin  . EYE SURGERY Bilateral    Cataract Extraction with IOL  . INTRAOPERATIVE TRANSESOPHAGEAL ECHOCARDIOGRAM N/A 07/02/2013   Procedure: INTRAOPERATIVE TRANSESOPHAGEAL ECHOCARDIOGRAM;  Surgeon: Ivin [name], MD;  Location: [place];  Service: Open Heart Surgery;  Laterality: N/A;  . PERIPHERAL VASCULAR CATHETERIZATION Right 12/06/2015   Procedure: Dialysis/Perma Catheter Insertion;  Surgeon: Katha Cabal, MD;  Location: [place] [facility];  Service: Cardiovascular;  Laterality: Right;  . PERIPHERAL VASCULAR THROMBECTOMY Right 09/28/2016   Procedure: Peripheral Vascular Thrombectomy;  Surgeon: Katha Cabal, MD;  Location: [place] [facility];  Service: Cardiovascular;  Laterality: Right;  . PORTA CATH REMOVAL N/A 06/01/2016   Procedure: Glori [name] Cath Removal;  Surgeon: Katha Cabal, MD;  Location: [place] [facility];  Service: Cardiovascular;  Laterality: N/A;  . THORACENTESIS  Left 2015   Family History  Problem Relation Age of Onset  . COPD Mother   . Cancer Mother        Lung  . Pulmonary embolism Father   . Diabetes Father   . Diabetes Paternal Grandfather   . Heart disease Maternal Grandmother   . Colon cancer Neg Hx   . Colon polyps Neg Hx   . Esophageal cancer Neg Hx   . Pancreatic cancer Neg Hx   . Liver disease Neg Hx    Allergies  Allergen Reactions  . Nsaids Other (See Comments)    Contraindicated due to kidney disease.      Assessment & Plan:  Patient presents for her first post procedure follow-up. Patient is status post a right brachial artery to axillary vein arteriovenous graft cannulation under ultrasound guidance, right arm shuntogram, percutaneous transluminal angioplasty of the axillary vein just beyond the previously placed stent with 7 mm diameter by 6 cm length Lutonix drug-coated angioplasty balloon on 12/17/16. Patient's dialysis centers states she is "pulling clots". Patient's last dialysis was yesterday for 3 hours. She denies any issues with cannulation or prolonged bleeding. Patient denies  any right upper extremity pain. Denies any fever, nausea vomiting.  1. ESRD on dialysis ([place]) - Stable Patient with recent fistulogram and intervention on 12/17/2016. Patient continues to "pull clots" as per dialysis staff during dialysis. I will order a right upper extremity HDA to assess anatomy / areas of narrowing or stenosis Patient can continue dialysis at this time Physical exam is unremarkable  - VAS US DUPLEX DIALYSIS ACCESS (AVF,AVG); Future  2. Hyperlipidemia, unspecified hyperlipidemia type - Stable Encouraged good control as its slows the progression of atherosclerotic disease  3. Complication from renal dialysis device, sequela - Stable As above  Current Outpatient Prescriptions on File Prior to Visit  Medication Sig Dispense Refill  . ACCU-CHEK FASTCLIX LANCETS MISC Check blood glucose twice daily, diagnosis code E11.9  204 each 3  . acetaminophen (TYLENOL) 325 MG tablet Take 650 mg by mouth daily as needed for moderate pain or headache.     Marland Kitchen amiodarone (PACERONE) 200 MG tablet Take 200 mg by mouth daily.    Marland Kitchen aspirin EC 81 MG tablet Take 81 mg by mouth daily.    Marland Kitchen atorvastatin (LIPITOR) 80 MG tablet TAKE 1 TABLET BY MOUTH EVERY DAY 30 tablet 3  . blood glucose meter kit and supplies KIT Dispense based on patient and insurance preference. Use up to twice daily. (FOR ICD-10 E11.9) 1 each 0  . ferrous sulfate 325 (65 FE) MG tablet Take 325 mg by mouth daily.     . furosemide (LASIX) 20 MG tablet Take 20 mg by mouth every other day. Non-dialysis days    . gabapentin (NEURONTIN) 300 MG capsule TAKE 1 CAPSULE(300 MG) BY MOUTH THREE TIMES DAILY 90 capsule 0  . glucose blood (CONTOUR NEXT TEST) test strip Please check blood glucose twice daily, diagnosis code E11.9 100 each 12  . Insulin Glargine (LANTUS SOLOSTAR) 100 UNIT/ML Solostar Pen Inject 14 Units into the skin daily at 10 pm. 15 mL 1  . Insulin Glargine (TOUJEO MAX SOLOSTAR) 300 UNIT/ML SOPN Inject 14 Units into the skin daily. 1 pen 0  . Insulin Pen Needle (GLOBAL EASE INJECT PEN NEEDLES) 31G X 5 MM MISC Inject 0.1 mLs (10 Units total) into the skin at bedtime 90 each 2  . levothyroxine (SYNTHROID, LEVOTHROID) 75 MCG tablet Take 1 tablet (75 mcg total) by mouth daily. 90 tablet 1  . lidocaine-prilocaine (EMLA) cream Apply 1 application topically as needed (port access).    . metoCLOPramide (REGLAN) 5 MG tablet Take 1 tablet (5 mg total) by mouth every 8 (eight) hours as needed for nausea. 30 tablet 1  . nitroGLYCERIN (NITROSTAT) 0.4 MG SL tablet DISSOLVE ONE TABLET UNDER THE TONGUE EVERY 5 MINUTES AS NEEDED FOR CHEST PAIN.  DO NOT EXCEED A TOTAL OF 3 DOSES IN 15 MINUTES 25 tablet 0  . omeprazole (PRILOSEC) 20 MG capsule Take 40 mg by mouth 2 (two) times daily.    . traZODone (DESYREL) 50 MG tablet TAKE 1 TABLET BY MOUTH EVERY NIGHT AT BEDTIME 30 tablet 0   No  current facility-administered medications on file prior to visit.    There are no Patient Instructions on file for this visit. No Follow-up on file.  Ah Bott A Genessa Beman, PA-C

## 2016-12-25 NOTE — Telephone Encounter (Signed)
fyi

## 2016-12-25 NOTE — Telephone Encounter (Signed)
Noted. Please confirm that she is going to be taking the Lantus. Thanks.

## 2016-12-25 NOTE — Telephone Encounter (Signed)
Pt husband called to inform Dr Caryl Bis to let him know that the insurance covered the insulin. Thank you!

## 2016-12-26 DIAGNOSIS — D509 Iron deficiency anemia, unspecified: Secondary | ICD-10-CM | POA: Diagnosis not present

## 2016-12-26 DIAGNOSIS — N186 End stage renal disease: Secondary | ICD-10-CM | POA: Diagnosis not present

## 2016-12-26 DIAGNOSIS — D631 Anemia in chronic kidney disease: Secondary | ICD-10-CM | POA: Diagnosis not present

## 2016-12-26 DIAGNOSIS — N2581 Secondary hyperparathyroidism of renal origin: Secondary | ICD-10-CM | POA: Diagnosis not present

## 2016-12-26 DIAGNOSIS — E1122 Type 2 diabetes mellitus with diabetic chronic kidney disease: Secondary | ICD-10-CM | POA: Diagnosis not present

## 2016-12-26 NOTE — Telephone Encounter (Signed)
Patients husband states she will be taking the lantus

## 2016-12-26 NOTE — Telephone Encounter (Signed)
Noted  

## 2016-12-28 DIAGNOSIS — N186 End stage renal disease: Secondary | ICD-10-CM | POA: Diagnosis not present

## 2016-12-28 DIAGNOSIS — D631 Anemia in chronic kidney disease: Secondary | ICD-10-CM | POA: Diagnosis not present

## 2016-12-28 DIAGNOSIS — E1122 Type 2 diabetes mellitus with diabetic chronic kidney disease: Secondary | ICD-10-CM | POA: Diagnosis not present

## 2016-12-28 DIAGNOSIS — D509 Iron deficiency anemia, unspecified: Secondary | ICD-10-CM | POA: Diagnosis not present

## 2016-12-28 DIAGNOSIS — N2581 Secondary hyperparathyroidism of renal origin: Secondary | ICD-10-CM | POA: Diagnosis not present

## 2016-12-31 DIAGNOSIS — E1122 Type 2 diabetes mellitus with diabetic chronic kidney disease: Secondary | ICD-10-CM | POA: Diagnosis not present

## 2016-12-31 DIAGNOSIS — D631 Anemia in chronic kidney disease: Secondary | ICD-10-CM | POA: Diagnosis not present

## 2016-12-31 DIAGNOSIS — N2581 Secondary hyperparathyroidism of renal origin: Secondary | ICD-10-CM | POA: Diagnosis not present

## 2016-12-31 DIAGNOSIS — N186 End stage renal disease: Secondary | ICD-10-CM | POA: Diagnosis not present

## 2016-12-31 DIAGNOSIS — D509 Iron deficiency anemia, unspecified: Secondary | ICD-10-CM | POA: Diagnosis not present

## 2017-01-01 ENCOUNTER — Ambulatory Visit (INDEPENDENT_AMBULATORY_CARE_PROVIDER_SITE_OTHER): Payer: Medicare Other | Admitting: Vascular Surgery

## 2017-01-01 ENCOUNTER — Encounter (INDEPENDENT_AMBULATORY_CARE_PROVIDER_SITE_OTHER): Payer: Self-pay

## 2017-01-01 ENCOUNTER — Ambulatory Visit (INDEPENDENT_AMBULATORY_CARE_PROVIDER_SITE_OTHER): Payer: Medicare Other

## 2017-01-01 ENCOUNTER — Other Ambulatory Visit (INDEPENDENT_AMBULATORY_CARE_PROVIDER_SITE_OTHER): Payer: Self-pay | Admitting: Vascular Surgery

## 2017-01-01 ENCOUNTER — Encounter (INDEPENDENT_AMBULATORY_CARE_PROVIDER_SITE_OTHER): Payer: Self-pay | Admitting: Vascular Surgery

## 2017-01-01 VITALS — BP 121/63 | HR 65 | Resp 16 | Ht 64.0 in | Wt 200.0 lb

## 2017-01-01 DIAGNOSIS — Z992 Dependence on renal dialysis: Secondary | ICD-10-CM

## 2017-01-01 DIAGNOSIS — I2510 Atherosclerotic heart disease of native coronary artery without angina pectoris: Secondary | ICD-10-CM

## 2017-01-01 DIAGNOSIS — N186 End stage renal disease: Secondary | ICD-10-CM

## 2017-01-01 DIAGNOSIS — T829XXS Unspecified complication of cardiac and vascular prosthetic device, implant and graft, sequela: Secondary | ICD-10-CM | POA: Diagnosis not present

## 2017-01-01 NOTE — Progress Notes (Signed)
Subjective:    [name], female    DOB: June 28, 1955, 61 y.o.   MRN: [id number] Chief Complaint  Patient presents with  . Follow-up    ASAP HDA   Presents with a chief complaint of a "nonfunctioning graft". Patient states issues with cannulation during dialysis. Patient also notes that staff "pull clots" during her treatments. The patient presents today with a small hematoma medial to proximal aspect of her graft due to what seems like difficulties with cannulation. Her arm is also ecchymotic from cannulation as well, the patient underwent a right upper extremity HDA which was notable for a patent right brachial axillary AV graft with a hemodynamically significant velocity increase near the arterial anastomosis.Flow volume 1229. Patient denies any fever, nausea or vomiting.   Review of Systems  Constitutional: Negative.   HENT: Negative.   Eyes: Negative.   Respiratory: Negative.   Cardiovascular: Negative.   Gastrointestinal: Negative.   Endocrine: Negative.   Genitourinary:       Issues with dialysis  Musculoskeletal: Negative.   Skin: Negative.   Allergic/Immunologic: Negative.   Neurological: Negative.   Hematological: Negative.   Psychiatric/Behavioral: Negative.       Objective:   Physical Exam  Constitutional: She is oriented to person, place, and time. She appears well-developed and well-nourished. No distress.  HENT:  Head: Normocephalic and atraumatic.  Eyes: Pupils are equal, round, and reactive to light. Conjunctivae are normal.  Neck: Normal range of motion.  Cardiovascular: Normal rate, normal heart sounds and intact distal pulses.   Pulses:      Radial pulses are 2+ on the right side, and 2+ on the left side.  Right upper extremity graft: good bruit and pulsatile thrill. There is a small hematoma to the medial aspect of the proximal graft. There is ecchymosis surrounding her graft.  Pulmonary/Chest: Effort normal.  Musculoskeletal: Normal  range of motion. She exhibits no edema.  Neurological: She is alert and oriented to person, place, and time.  Skin: Skin is warm and dry. She is not diaphoretic.  Psychiatric: She has a normal mood and affect. Her behavior is normal. Judgment and thought content normal.  Vitals reviewed.  BP 121/63 (BP Location: Left Arm)   Pulse 65   Resp 16   Ht _0  (1.626 m)   Wt 200 lb (90.7 kg)   BMI 34.33 kg/m   Past Medical History:  Diagnosis Date  . Anemia   . Chronic diastolic CHF (congestive heart failure) ([name])    a. Due to ischemic cardiomyopathy. EF as low as 35%, improved to normal s/p CABG; b. echo 07/06/13: EF 55-60%, no RWMAs, mod TR, trivial pericardial effusion not c/w tamponade physiology;  c. 10/2015 Echo: EF 65%, Gr1 DD, triv AI, mild MR, mildly dil LA, mod TR, PASP 24mHg.  .Marland KitchenCoronary artery disease    a. NSTEMI 06/2013; b.cath: severe three-vessel CAD w/ EF 30% & mild-mod MR; c. s/p 3 vessel CABG 07/02/13 (LIMA-LAD, SVG-OM, and SVG-RPDA);  d. 10/2015 MV: no ischemia/infarct.  . Diabetes mellitus without complication ([place]   . Diabetic neuropathy ([place]   . Dialysis patient ([facility]    Tues, Thurs, Sat  . ESRD (end stage renal disease) ([place]    a. 12/2015 initiated TTS dialysis.  .Marland KitchenGERD (gastroesophageal reflux disease)   . Hyperlipidemia   . Hypertension   . Hypothyroidism   . Myocardial infarction ([place] 2015  . Neuropathy   . Pleural effusion 2015  . Pulmonary hypertension (  [place])   . Wears dentures    full lower   Social History   Social History  . Marital status: Married    Spouse name: N/A  . Number of children: N/A  . Years of education: N/A   Occupational History  . works for [employer] Topics  . Smoking status: Never Smoker  . Smokeless tobacco: Never Used  . Alcohol use No  . Drug use: No  . Sexual activity: Not on file   Other Topics Concern  . Not on file   Social History Narrative   Patient lives at home with her  husband. Patient has 4 adult children.   Past Surgical History:  Procedure Laterality Date  . A/V FISTULAGRAM Right 12/17/2016   Procedure: A/V Fistulagram;  Surgeon: Algernon Huxley, MD;  Location: [place] [facility];  Service: Cardiovascular;  Laterality: Right;  . AV FISTULA PLACEMENT Right 02/03/2016   Procedure: INSERTION OF ARTERIOVENOUS (AV) GORE-TEX GRAFT ARM ( BRACH / AXILLARY );  Surgeon: Katha Cabal, MD;  Location: [facility];  Service: Vascular;  Laterality: Right;  . CARDIAC CATHETERIZATION    . CATARACT EXTRACTION Bilateral   . CHOLECYSTECTOMY N/A 12/09/2014   Procedure: LAPAROSCOPIC CHOLECYSTECTOMY;  Surgeon: Marlyce [name], MD;  Location: [facility];  Service: General;  Laterality: N/A;  . CORONARY ARTERY BYPASS GRAFT N/A 07/02/2013   Procedure: CORONARY ARTERY BYPASS GRAFTING (CABG);  Surgeon: Ivin [name], MD;  Location: [place];  Service: Open Heart Surgery;  Laterality: N/A;  CABG x three, using left internal mammary artery and right leg greater saphenous vein harvested endoscopically  . ESOPHAGOGASTRODUODENOSCOPY (EGD) WITH PROPOFOL N/A 11/24/2015   Procedure: ESOPHAGOGASTRODUODENOSCOPY (EGD) WITH PROPOFOL;  Surgeon: [name], MD;  Location: [place];  Service: Endoscopy;  Laterality: N/A;  Diabetic - insulin  . EYE SURGERY Bilateral    Cataract Extraction with IOL  . INTRAOPERATIVE TRANSESOPHAGEAL ECHOCARDIOGRAM N/A 07/02/2013   Procedure: INTRAOPERATIVE TRANSESOPHAGEAL ECHOCARDIOGRAM;  Surgeon: Ivin [name], MD;  Location: [place];  Service: Open Heart Surgery;  Laterality: N/A;  . PERIPHERAL VASCULAR CATHETERIZATION Right 12/06/2015   Procedure: Dialysis/Perma Catheter Insertion;  Surgeon: Katha Cabal, MD;  Location: [place] [facility];  Service: Cardiovascular;  Laterality: Right;  . PERIPHERAL VASCULAR THROMBECTOMY Right 09/28/2016   Procedure: Peripheral Vascular Thrombectomy;  Surgeon: Katha Cabal, MD;  Location: [place] [facility];   Service: Cardiovascular;  Laterality: Right;  . PORTA CATH REMOVAL N/A 06/01/2016   Procedure: Glori [name] Cath Removal;  Surgeon: Katha Cabal, MD;  Location: [place] [facility];  Service: Cardiovascular;  Laterality: N/A;  . THORACENTESIS Left 2015   Family History  Problem Relation Age of Onset  . COPD Mother   . Cancer Mother        Lung  . Pulmonary embolism Father   . Diabetes Father   . Diabetes Paternal Grandfather   . Heart disease Maternal Grandmother   . Colon cancer Neg Hx   . Colon polyps Neg Hx   . Esophageal cancer Neg Hx   . Pancreatic cancer Neg Hx   . Liver disease Neg Hx    Allergies  Allergen Reactions  . Nsaids Other (See Comments)    Contraindicated due to kidney disease.      Assessment & Plan:  Presents with a chief complaint of a "nonfunctioning graft". Patient states issues with cannulation during dialysis. Patient also notes that staff "pull clots" during her treatments.  The patient presents today with a small hematoma medial to proximal aspect of her graft due to what seems like difficulties with cannulation. Her arm is also ecchymotic from cannulation as well, the patient underwent a right upper extremity HDA which was notable for a patent right brachial axillary AV graft with a hemodynamically significant velocity increase near the arterial anastomosis.Flow volume 1229. Patient denies any fever, nausea or vomiting.  1. ESRD on dialysis ([place]) - Stable Patient with issues during cannulation. Patient developing clots during her dialysis treatment. HDA with area of significant stenosis near the anastomotic site. Recommend a right upper extremity shuntogram and an effort to assess anatomy and correct any stenosis at that time. Procedure, risks and benefits explained to the patient All questions answered He shouldn't wishes to proceed.  2. Complication from renal dialysis device, sequela - Stable As above  Current Outpatient Prescriptions on File  Prior to Visit  Medication Sig Dispense Refill  . ACCU-CHEK FASTCLIX LANCETS MISC Check blood glucose twice daily, diagnosis code E11.9 204 each 3  . acetaminophen (TYLENOL) 325 MG tablet Take 650 mg by mouth daily as needed for moderate pain or headache.     Marland Kitchen amiodarone (PACERONE) 200 MG tablet Take 200 mg by mouth daily.    Marland Kitchen aspirin EC 81 MG tablet Take 81 mg by mouth daily.    Marland Kitchen atorvastatin (LIPITOR) 80 MG tablet TAKE 1 TABLET BY MOUTH EVERY DAY 30 tablet 3  . blood glucose meter kit and supplies KIT Dispense based on patient and insurance preference. Use up to twice daily. (FOR ICD-10 E11.9) 1 each 0  . ferrous sulfate 325 (65 FE) MG tablet Take 325 mg by mouth daily.     . furosemide (LASIX) 20 MG tablet Take 20 mg by mouth every other day. Non-dialysis days    . gabapentin (NEURONTIN) 300 MG capsule TAKE 1 CAPSULE(300 MG) BY MOUTH THREE TIMES DAILY 90 capsule 0  . glucose blood (CONTOUR NEXT TEST) test strip Please check blood glucose twice daily, diagnosis code E11.9 100 each 12  . Insulin Glargine (LANTUS SOLOSTAR) 100 UNIT/ML Solostar Pen Inject 14 Units into the skin daily at 10 pm. 15 mL 1  . Insulin Glargine (TOUJEO MAX SOLOSTAR) 300 UNIT/ML SOPN Inject 14 Units into the skin daily. 1 pen 0  . Insulin Pen Needle (GLOBAL EASE INJECT PEN NEEDLES) 31G X 5 MM MISC Inject 0.1 mLs (10 Units total) into the skin at bedtime 90 each 2  . levothyroxine (SYNTHROID, LEVOTHROID) 75 MCG tablet Take 1 tablet (75 mcg total) by mouth daily. 90 tablet 1  . lidocaine-prilocaine (EMLA) cream Apply 1 application topically as needed (port access).    . metoCLOPramide (REGLAN) 5 MG tablet Take 1 tablet (5 mg total) by mouth every 8 (eight) hours as needed for nausea. 30 tablet 1  . nitroGLYCERIN (NITROSTAT) 0.4 MG SL tablet DISSOLVE ONE TABLET UNDER THE TONGUE EVERY 5 MINUTES AS NEEDED FOR CHEST PAIN.  DO NOT EXCEED A TOTAL OF 3 DOSES IN 15 MINUTES 25 tablet 0  . omeprazole (PRILOSEC) 20 MG capsule Take  40 mg by mouth 2 (two) times daily.    . traZODone (DESYREL) 50 MG tablet TAKE 1 TABLET BY MOUTH EVERY NIGHT AT BEDTIME 30 tablet 0   No current facility-administered medications on file prior to visit.     There are no Patient Instructions on file for this visit. No Follow-up on file.   Iosefa Weintraub A Torra Pala, PA-C

## 2017-01-02 DIAGNOSIS — N2581 Secondary hyperparathyroidism of renal origin: Secondary | ICD-10-CM | POA: Diagnosis not present

## 2017-01-02 DIAGNOSIS — E1122 Type 2 diabetes mellitus with diabetic chronic kidney disease: Secondary | ICD-10-CM | POA: Diagnosis not present

## 2017-01-02 DIAGNOSIS — Z992 Dependence on renal dialysis: Secondary | ICD-10-CM | POA: Diagnosis not present

## 2017-01-02 DIAGNOSIS — D631 Anemia in chronic kidney disease: Secondary | ICD-10-CM | POA: Diagnosis not present

## 2017-01-02 DIAGNOSIS — N186 End stage renal disease: Secondary | ICD-10-CM | POA: Diagnosis not present

## 2017-01-02 DIAGNOSIS — D509 Iron deficiency anemia, unspecified: Secondary | ICD-10-CM | POA: Diagnosis not present

## 2017-01-04 DIAGNOSIS — E1122 Type 2 diabetes mellitus with diabetic chronic kidney disease: Secondary | ICD-10-CM | POA: Diagnosis not present

## 2017-01-04 DIAGNOSIS — N186 End stage renal disease: Secondary | ICD-10-CM | POA: Diagnosis not present

## 2017-01-04 DIAGNOSIS — D631 Anemia in chronic kidney disease: Secondary | ICD-10-CM | POA: Diagnosis not present

## 2017-01-04 DIAGNOSIS — N2581 Secondary hyperparathyroidism of renal origin: Secondary | ICD-10-CM | POA: Diagnosis not present

## 2017-01-04 DIAGNOSIS — D509 Iron deficiency anemia, unspecified: Secondary | ICD-10-CM | POA: Diagnosis not present

## 2017-01-06 MED ORDER — CEFAZOLIN SODIUM-DEXTROSE 1-4 GM/50ML-% IV SOLN
1.0000 g | Freq: Once | INTRAVENOUS | Status: AC
  Administered 2017-01-07: 1 g via INTRAVENOUS

## 2017-01-07 ENCOUNTER — Ambulatory Visit
Admission: RE | Admit: 2017-01-07 | Discharge: 2017-01-07 | Disposition: A | Discharge status: Home / Self Care | Payer: Medicare Other | Source: Ambulatory Visit | Attending: Vascular Surgery | Admitting: Vascular Surgery

## 2017-01-07 ENCOUNTER — Other Ambulatory Visit: Payer: Self-pay | Admitting: Family Medicine

## 2017-01-07 ENCOUNTER — Encounter
Admission: RE | Disposition: A | Discharge status: Home / Self Care | Payer: Self-pay | Source: Ambulatory Visit | Attending: Vascular Surgery

## 2017-01-07 DIAGNOSIS — Z8249 Family history of ischemic heart disease and other diseases of the circulatory system: Secondary | ICD-10-CM | POA: Insufficient documentation

## 2017-01-07 DIAGNOSIS — E1122 Type 2 diabetes mellitus with diabetic chronic kidney disease: Secondary | ICD-10-CM | POA: Diagnosis not present

## 2017-01-07 DIAGNOSIS — D631 Anemia in chronic kidney disease: Secondary | ICD-10-CM | POA: Diagnosis not present

## 2017-01-07 DIAGNOSIS — Z886 Allergy status to analgesic agent status: Secondary | ICD-10-CM | POA: Diagnosis not present

## 2017-01-07 DIAGNOSIS — Z9842 Cataract extraction status, left eye: Secondary | ICD-10-CM | POA: Insufficient documentation

## 2017-01-07 DIAGNOSIS — Z836 Family history of other diseases of the respiratory system: Secondary | ICD-10-CM | POA: Diagnosis not present

## 2017-01-07 DIAGNOSIS — I25810 Atherosclerosis of coronary artery bypass graft(s) without angina pectoris: Secondary | ICD-10-CM | POA: Diagnosis not present

## 2017-01-07 DIAGNOSIS — E1140 Type 2 diabetes mellitus with diabetic neuropathy, unspecified: Secondary | ICD-10-CM | POA: Insufficient documentation

## 2017-01-07 DIAGNOSIS — I132 Hypertensive heart and chronic kidney disease with heart failure and with stage 5 chronic kidney disease, or end stage renal disease: Secondary | ICD-10-CM | POA: Insufficient documentation

## 2017-01-07 DIAGNOSIS — T82868A Thrombosis of vascular prosthetic devices, implants and grafts, initial encounter: Secondary | ICD-10-CM | POA: Diagnosis not present

## 2017-01-07 DIAGNOSIS — Z833 Family history of diabetes mellitus: Secondary | ICD-10-CM | POA: Diagnosis not present

## 2017-01-07 DIAGNOSIS — Z9841 Cataract extraction status, right eye: Secondary | ICD-10-CM | POA: Insufficient documentation

## 2017-01-07 DIAGNOSIS — N186 End stage renal disease: Secondary | ICD-10-CM | POA: Insufficient documentation

## 2017-01-07 DIAGNOSIS — Z951 Presence of aortocoronary bypass graft: Secondary | ICD-10-CM | POA: Diagnosis not present

## 2017-01-07 DIAGNOSIS — Y832 Surgical operation with anastomosis, bypass or graft as the cause of abnormal reaction of the patient, or of later complication, without mention of misadventure at the time of the procedure: Secondary | ICD-10-CM | POA: Diagnosis not present

## 2017-01-07 DIAGNOSIS — I252 Old myocardial infarction: Secondary | ICD-10-CM | POA: Diagnosis not present

## 2017-01-07 DIAGNOSIS — T82858A Stenosis of vascular prosthetic devices, implants and grafts, initial encounter: Secondary | ICD-10-CM | POA: Diagnosis not present

## 2017-01-07 DIAGNOSIS — Z992 Dependence on renal dialysis: Secondary | ICD-10-CM | POA: Insufficient documentation

## 2017-01-07 DIAGNOSIS — E039 Hypothyroidism, unspecified: Secondary | ICD-10-CM | POA: Diagnosis not present

## 2017-01-07 DIAGNOSIS — Z9889 Other specified postprocedural states: Secondary | ICD-10-CM | POA: Diagnosis not present

## 2017-01-07 DIAGNOSIS — Z9049 Acquired absence of other specified parts of digestive tract: Secondary | ICD-10-CM | POA: Diagnosis not present

## 2017-01-07 DIAGNOSIS — I5032 Chronic diastolic (congestive) heart failure: Secondary | ICD-10-CM | POA: Diagnosis not present

## 2017-01-07 DIAGNOSIS — Z801 Family history of malignant neoplasm of trachea, bronchus and lung: Secondary | ICD-10-CM | POA: Insufficient documentation

## 2017-01-07 DIAGNOSIS — K219 Gastro-esophageal reflux disease without esophagitis: Secondary | ICD-10-CM | POA: Insufficient documentation

## 2017-01-07 HISTORY — PX: A/V FISTULAGRAM: CATH118298

## 2017-01-07 LAB — GLUCOSE, CAPILLARY
Glucose-Capillary: 179 mg/dL — ABNORMAL HIGH (ref 65–99)
Glucose-Capillary: 193 mg/dL — ABNORMAL HIGH (ref 65–99)

## 2017-01-07 LAB — POTASSIUM (ARMC VASCULAR LAB ONLY): Potassium (ARMC vascular lab): 5.2 — ABNORMAL HIGH (ref 3.5–5.1)

## 2017-01-07 SURGERY — A/V FISTULAGRAM
Anesthesia: Moderate Sedation | Laterality: Right

## 2017-01-07 MED ORDER — MIDAZOLAM HCL 2 MG/2ML IJ SOLN
INTRAMUSCULAR | Status: AC
  Filled 2017-01-07: qty 4

## 2017-01-07 MED ORDER — MIDAZOLAM HCL 2 MG/2ML IJ SOLN
INTRAMUSCULAR | Status: DC | PRN
  Administered 2017-01-07: 2 mg via INTRAVENOUS

## 2017-01-07 MED ORDER — IOPAMIDOL (ISOVUE-300) INJECTION 61%
INTRAVENOUS | Status: DC | PRN
  Administered 2017-01-07: 25 mL via INTRAVENOUS

## 2017-01-07 MED ORDER — FENTANYL CITRATE (PF) 100 MCG/2ML IJ SOLN
INTRAMUSCULAR | Status: DC | PRN
  Administered 2017-01-07: 50 ug via INTRAVENOUS

## 2017-01-07 MED ORDER — HEPARIN (PORCINE) IN NACL 2-0.9 UNIT/ML-% IJ SOLN
INTRAMUSCULAR | Status: AC
  Filled 2017-01-07: qty 1000

## 2017-01-07 MED ORDER — ONDANSETRON HCL 4 MG/2ML IJ SOLN: 4.0000 mg | Freq: Four times a day (QID) | INTRAMUSCULAR | Status: DC | PRN

## 2017-01-07 MED ORDER — HYDROMORPHONE HCL 1 MG/ML IJ SOLN: 1.0000 mg | Freq: Once | INTRAMUSCULAR | Status: DC | PRN

## 2017-01-07 MED ORDER — HEPARIN SODIUM (PORCINE) 1000 UNIT/ML IJ SOLN
INTRAMUSCULAR | Status: DC | PRN
  Administered 2017-01-07: 3000 [IU] via INTRAVENOUS

## 2017-01-07 MED ORDER — FENTANYL CITRATE (PF) 100 MCG/2ML IJ SOLN
INTRAMUSCULAR | Status: AC
  Filled 2017-01-07: qty 2

## 2017-01-07 MED ORDER — SODIUM CHLORIDE 0.9 % IV SOLN: INTRAVENOUS | Status: DC

## 2017-01-07 MED ORDER — LIDOCAINE-EPINEPHRINE (PF) 1 %-1:200000 IJ SOLN
INTRAMUSCULAR | Status: AC
  Filled 2017-01-07: qty 30

## 2017-01-07 MED ORDER — HEPARIN SODIUM (PORCINE) 1000 UNIT/ML IJ SOLN
INTRAMUSCULAR | Status: AC
  Filled 2017-01-07: qty 1

## 2017-01-07 MED ORDER — LIDOCAINE-EPINEPHRINE (PF) 1 %-1:200000 IJ SOLN
INTRAMUSCULAR | Status: DC | PRN
  Administered 2017-01-07: 10 mL via INTRADERMAL

## 2017-01-07 MED ORDER — METHYLPREDNISOLONE SODIUM SUCC 125 MG IJ SOLR: 125.0000 mg | INTRAMUSCULAR | Status: DC | PRN

## 2017-01-07 MED ORDER — FAMOTIDINE 20 MG PO TABS: 40.0000 mg | ORAL_TABLET | ORAL | Status: DC | PRN

## 2017-01-07 SURGICAL SUPPLY — 11 items
BALLN LUTONIX DCB 5X40X130 (BALLOONS) ×3
BALLOON LUTONIX DCB 5X40X130 (BALLOONS) ×1 IMPLANT
CANNULA 5F STIFF (CANNULA) ×3 IMPLANT
CATH BEACON 5 .035 40 KMP TP (CATHETERS) ×1 IMPLANT
CATH BEACON 5 .038 40 KMP TP (CATHETERS) ×2
DEVICE PRESTO INFLATION (MISCELLANEOUS) ×3 IMPLANT
DRAPE BRACHIAL (DRAPES) ×3 IMPLANT
PACK ANGIOGRAPHY (CUSTOM PROCEDURE TRAY) ×3 IMPLANT
SHEATH BRITE TIP 6FRX5.5 (SHEATH) ×3 IMPLANT
SUT MNCRL AB 4-0 PS2 18 (SUTURE) ×3 IMPLANT
WIRE MAGIC TOR.035 180C (WIRE) ×3 IMPLANT

## 2017-01-07 NOTE — Op Note (Signed)
[facility]    OPERATIVE NOTE   PROCEDURE: 1.  Right brachial artery to axillary vein arteriovenous graft cannulation under ultrasound guidance 2.  Right arm shuntogram 3.  Percutaneous transluminal angioplasty of the arterial anastomosis with 5 mm diameter by 4 cm length Lutonix drug-coated angioplasty balloon  PRE-OPERATIVE DIAGNOSIS: 1. ESRD 2. Malfunctioning right brachial artery to axillary vein arteriovenous graft  POST-OPERATIVE DIAGNOSIS: same as above   SURGEON: Leotis Pain, MD  ANESTHESIA: local with MCS  ESTIMATED BLOOD LOSS: 10 cc  FINDING(S): 1. Approximately 70% stenosis in the graft just beyond the arterial anastomosis.  There is also about a 40% stenosis just proximal to the previously placed stent at the venous anastomosis in the axillary vein.  The remainder of the graft and the central venous circulation was widely patent.  SPECIMEN(S):  None  CONTRAST: 25 cc  FLUORO TIME: 1.2 minutes  MODERATE CONSCIOUS SEDATION TIME:  Approximately 20 minutes using 2 mg of Versed and 50 Mcg of Fentanyl  INDICATIONS: Jamie Arias is a 61 y.o. female who presents with malfunctioning right brachial artery to axillary vein arteriovenous graft.  The patient is scheduled for right arm shuntogram.  The patient is aware the risks include but are not limited to: bleeding, infection, thrombosis of the cannulated access, and possible anaphylactic reaction to the contrast.  The patient is aware of the risks of the procedure and elects to proceed forward.  DESCRIPTION: After full informed written consent was obtained, the patient was brought back to the angiography suite and placed supine upon the angiography table.  The patient was connected to monitoring equipment. Moderate conscious sedation was administered during a face to face encounter throughout the procedure with my supervision of the RN administering medicines and monitoring the patient's vital signs,  pulse oximetry, telemetry and mental status throughout from the start of the procedure until the patient was taken to the recovery room The right arm was prepped and draped in the standard fashion for a percutaneous access intervention.  Under ultrasound guidance, the right brachial artery to axillary vein arteriovenous graft was cannulated with a micropuncture needle under direct ultrasound guidance in a retrograde fashion near the midportion of the graft and a permanent image was performed.  The microwire was advanced into the graft and the needle was exchanged for the a microsheath.  I then upsized to a 6 Fr Sheath and imaging was performed.  Kumpe catheter was placed at the arterial anastomosis to evaluate the arterial anastomosis.  Hand injections were completed to image the access including the central venous system. This demonstrated 70% stenosis in the graft just beyond the arterial anastomosis.  There is also about a 40% stenosis just proximal to the previously placed stent at the venous anastomosis in the axillary vein.  The remainder of the graft and the central venous circulation was widely patent.  Based on the images, this patient will need intervention to the stenosis near the arterial anastomosis. I then gave the patient 3000 units of intravenous heparin.  I then crossed the stenosis with a Magic Tourqe wire.  Based on the imaging, a 5 mm x 4 cm Lutonix drug-coated angioplasty balloon was selected.  The balloon was centered around the stenosis and inflated to 12 ATM for 1 minute(s).  On completion imaging, a 10 % residual stenosis was present.     Based on the completion imaging, no further intervention is necessary and we will observe the stenosis just beyond the stent  at the venous anastomosis as it did not appear to be greater than 50%.  The wire and balloon were removed from the sheath.  A 4-0 Monocryl purse-string suture was sewn around the sheath.  The sheath was removed while tying down  the suture.  A sterile bandage was applied to the puncture site.  COMPLICATIONS: None  CONDITION: Stable   Leotis Pain  01/07/2017 9:56 AM    This note was created with Dragon Medical transcription system. Any errors in dictation are purely unintentional.

## 2017-01-07 NOTE — H&P (Signed)
[facility] History & Physical Update  The patient was interviewed and re-examined.  The patient's previous History and Physical has been reviewed and is unchanged.  There is no change in the plan of care. We plan to proceed with the scheduled procedure.  Leotis Pain, MD  01/07/2017, 8:07 AM

## 2017-01-08 ENCOUNTER — Encounter: Payer: Self-pay | Admitting: Vascular Surgery

## 2017-01-08 DIAGNOSIS — N186 End stage renal disease: Secondary | ICD-10-CM | POA: Diagnosis not present

## 2017-01-08 DIAGNOSIS — E1122 Type 2 diabetes mellitus with diabetic chronic kidney disease: Secondary | ICD-10-CM | POA: Diagnosis not present

## 2017-01-08 DIAGNOSIS — N2581 Secondary hyperparathyroidism of renal origin: Secondary | ICD-10-CM | POA: Diagnosis not present

## 2017-01-08 DIAGNOSIS — D631 Anemia in chronic kidney disease: Secondary | ICD-10-CM | POA: Diagnosis not present

## 2017-01-08 DIAGNOSIS — D509 Iron deficiency anemia, unspecified: Secondary | ICD-10-CM | POA: Diagnosis not present

## 2017-01-09 DIAGNOSIS — E1122 Type 2 diabetes mellitus with diabetic chronic kidney disease: Secondary | ICD-10-CM | POA: Diagnosis not present

## 2017-01-09 DIAGNOSIS — N186 End stage renal disease: Secondary | ICD-10-CM | POA: Diagnosis not present

## 2017-01-09 DIAGNOSIS — D631 Anemia in chronic kidney disease: Secondary | ICD-10-CM | POA: Diagnosis not present

## 2017-01-09 DIAGNOSIS — D509 Iron deficiency anemia, unspecified: Secondary | ICD-10-CM | POA: Diagnosis not present

## 2017-01-09 DIAGNOSIS — N2581 Secondary hyperparathyroidism of renal origin: Secondary | ICD-10-CM | POA: Diagnosis not present

## 2017-01-11 DIAGNOSIS — N186 End stage renal disease: Secondary | ICD-10-CM | POA: Diagnosis not present

## 2017-01-11 DIAGNOSIS — D509 Iron deficiency anemia, unspecified: Secondary | ICD-10-CM | POA: Diagnosis not present

## 2017-01-11 DIAGNOSIS — E1122 Type 2 diabetes mellitus with diabetic chronic kidney disease: Secondary | ICD-10-CM | POA: Diagnosis not present

## 2017-01-11 DIAGNOSIS — D631 Anemia in chronic kidney disease: Secondary | ICD-10-CM | POA: Diagnosis not present

## 2017-01-11 DIAGNOSIS — N2581 Secondary hyperparathyroidism of renal origin: Secondary | ICD-10-CM | POA: Diagnosis not present

## 2017-01-14 ENCOUNTER — Telehealth: Payer: Self-pay | Admitting: Cardiovascular Disease

## 2017-01-14 DIAGNOSIS — D509 Iron deficiency anemia, unspecified: Secondary | ICD-10-CM | POA: Diagnosis not present

## 2017-01-14 DIAGNOSIS — E1122 Type 2 diabetes mellitus with diabetic chronic kidney disease: Secondary | ICD-10-CM | POA: Diagnosis not present

## 2017-01-14 DIAGNOSIS — N186 End stage renal disease: Secondary | ICD-10-CM | POA: Diagnosis not present

## 2017-01-14 DIAGNOSIS — D631 Anemia in chronic kidney disease: Secondary | ICD-10-CM | POA: Diagnosis not present

## 2017-01-14 DIAGNOSIS — N2581 Secondary hyperparathyroidism of renal origin: Secondary | ICD-10-CM | POA: Diagnosis not present

## 2017-01-14 NOTE — Telephone Encounter (Signed)
Pt c/o BP issue: STAT if pt c/o blurred vision, one-sided weakness or slurred speech  1. What are your last 5 BP readings?  185/75 this morning   Drops low low at HD   2. Are you having any other symptoms (ex. Dizziness, headache, blurred vision, passed out)?   No per spouse   3. What is your BP issue?  Patient husband called to see if she can be seen sooner because of fluctuating bp   Added to waitlist already scheduled next available

## 2017-01-15 NOTE — Telephone Encounter (Signed)
S/w patient's husband, ok per DPR. Patient has been having low BP during dialysis on Monday, Wednesday and Friday. BP drops to the 90's/40's during that time.  Denies dizziness, palpitations, chest pain or any other symptoms. BP goes back up after dialysis. Dr Juleen China, nephrologist, prescribed her Midodrine 5 mg BID on her dialysis days. On non-dialysis days like today her BP is WNL, 127/54, HR 64. Dr. Juleen China has taken her off of all cardiac meds except furosemide. Advised patient and husband to call Dr Juleen China for advice and to keep upcoming appointment with Dr Fletcher Anon as scheduled on 01/31/17. They verbalized understanding.

## 2017-01-16 DIAGNOSIS — D631 Anemia in chronic kidney disease: Secondary | ICD-10-CM | POA: Diagnosis not present

## 2017-01-16 DIAGNOSIS — D509 Iron deficiency anemia, unspecified: Secondary | ICD-10-CM | POA: Diagnosis not present

## 2017-01-16 DIAGNOSIS — E1122 Type 2 diabetes mellitus with diabetic chronic kidney disease: Secondary | ICD-10-CM | POA: Diagnosis not present

## 2017-01-16 DIAGNOSIS — N186 End stage renal disease: Secondary | ICD-10-CM | POA: Diagnosis not present

## 2017-01-16 DIAGNOSIS — N2581 Secondary hyperparathyroidism of renal origin: Secondary | ICD-10-CM | POA: Diagnosis not present

## 2017-01-17 DIAGNOSIS — N186 End stage renal disease: Secondary | ICD-10-CM | POA: Diagnosis not present

## 2017-01-17 DIAGNOSIS — N2581 Secondary hyperparathyroidism of renal origin: Secondary | ICD-10-CM | POA: Diagnosis not present

## 2017-01-17 DIAGNOSIS — E1122 Type 2 diabetes mellitus with diabetic chronic kidney disease: Secondary | ICD-10-CM | POA: Diagnosis not present

## 2017-01-17 DIAGNOSIS — D631 Anemia in chronic kidney disease: Secondary | ICD-10-CM | POA: Diagnosis not present

## 2017-01-17 DIAGNOSIS — D509 Iron deficiency anemia, unspecified: Secondary | ICD-10-CM | POA: Diagnosis not present

## 2017-01-18 DIAGNOSIS — H4313 Vitreous hemorrhage, bilateral: Secondary | ICD-10-CM | POA: Diagnosis not present

## 2017-01-18 DIAGNOSIS — E113512 Type 2 diabetes mellitus with proliferative diabetic retinopathy with macular edema, left eye: Secondary | ICD-10-CM | POA: Diagnosis not present

## 2017-01-18 DIAGNOSIS — E113511 Type 2 diabetes mellitus with proliferative diabetic retinopathy with macular edema, right eye: Secondary | ICD-10-CM | POA: Diagnosis not present

## 2017-01-18 LAB — HM DIABETES EYE EXAM

## 2017-01-20 DIAGNOSIS — D509 Iron deficiency anemia, unspecified: Secondary | ICD-10-CM | POA: Diagnosis not present

## 2017-01-20 DIAGNOSIS — N186 End stage renal disease: Secondary | ICD-10-CM | POA: Diagnosis not present

## 2017-01-20 DIAGNOSIS — E1122 Type 2 diabetes mellitus with diabetic chronic kidney disease: Secondary | ICD-10-CM | POA: Diagnosis not present

## 2017-01-20 DIAGNOSIS — N2581 Secondary hyperparathyroidism of renal origin: Secondary | ICD-10-CM | POA: Diagnosis not present

## 2017-01-20 DIAGNOSIS — D631 Anemia in chronic kidney disease: Secondary | ICD-10-CM | POA: Diagnosis not present

## 2017-01-22 DIAGNOSIS — N2581 Secondary hyperparathyroidism of renal origin: Secondary | ICD-10-CM | POA: Diagnosis not present

## 2017-01-22 DIAGNOSIS — N186 End stage renal disease: Secondary | ICD-10-CM | POA: Diagnosis not present

## 2017-01-22 DIAGNOSIS — D631 Anemia in chronic kidney disease: Secondary | ICD-10-CM | POA: Diagnosis not present

## 2017-01-22 DIAGNOSIS — D509 Iron deficiency anemia, unspecified: Secondary | ICD-10-CM | POA: Diagnosis not present

## 2017-01-22 DIAGNOSIS — E1122 Type 2 diabetes mellitus with diabetic chronic kidney disease: Secondary | ICD-10-CM | POA: Diagnosis not present

## 2017-01-25 DIAGNOSIS — N186 End stage renal disease: Secondary | ICD-10-CM | POA: Diagnosis not present

## 2017-01-25 DIAGNOSIS — N2581 Secondary hyperparathyroidism of renal origin: Secondary | ICD-10-CM | POA: Diagnosis not present

## 2017-01-25 DIAGNOSIS — D509 Iron deficiency anemia, unspecified: Secondary | ICD-10-CM | POA: Diagnosis not present

## 2017-01-25 DIAGNOSIS — D631 Anemia in chronic kidney disease: Secondary | ICD-10-CM | POA: Diagnosis not present

## 2017-01-25 DIAGNOSIS — E1122 Type 2 diabetes mellitus with diabetic chronic kidney disease: Secondary | ICD-10-CM | POA: Diagnosis not present

## 2017-01-28 DIAGNOSIS — E1122 Type 2 diabetes mellitus with diabetic chronic kidney disease: Secondary | ICD-10-CM | POA: Diagnosis not present

## 2017-01-28 DIAGNOSIS — D509 Iron deficiency anemia, unspecified: Secondary | ICD-10-CM | POA: Diagnosis not present

## 2017-01-28 DIAGNOSIS — D631 Anemia in chronic kidney disease: Secondary | ICD-10-CM | POA: Diagnosis not present

## 2017-01-28 DIAGNOSIS — N2581 Secondary hyperparathyroidism of renal origin: Secondary | ICD-10-CM | POA: Diagnosis not present

## 2017-01-28 DIAGNOSIS — N186 End stage renal disease: Secondary | ICD-10-CM | POA: Diagnosis not present

## 2017-01-29 ENCOUNTER — Ambulatory Visit (INDEPENDENT_AMBULATORY_CARE_PROVIDER_SITE_OTHER): Payer: Medicare Other | Admitting: Vascular Surgery

## 2017-01-29 ENCOUNTER — Encounter (INDEPENDENT_AMBULATORY_CARE_PROVIDER_SITE_OTHER): Payer: Medicare Other

## 2017-01-30 DIAGNOSIS — N186 End stage renal disease: Secondary | ICD-10-CM | POA: Diagnosis not present

## 2017-01-30 DIAGNOSIS — D509 Iron deficiency anemia, unspecified: Secondary | ICD-10-CM | POA: Diagnosis not present

## 2017-01-30 DIAGNOSIS — N2581 Secondary hyperparathyroidism of renal origin: Secondary | ICD-10-CM | POA: Diagnosis not present

## 2017-01-30 DIAGNOSIS — D631 Anemia in chronic kidney disease: Secondary | ICD-10-CM | POA: Diagnosis not present

## 2017-01-30 DIAGNOSIS — E1122 Type 2 diabetes mellitus with diabetic chronic kidney disease: Secondary | ICD-10-CM | POA: Diagnosis not present

## 2017-01-31 ENCOUNTER — Encounter: Payer: Self-pay | Admitting: Cardiovascular Disease

## 2017-01-31 ENCOUNTER — Ambulatory Visit (INDEPENDENT_AMBULATORY_CARE_PROVIDER_SITE_OTHER): Payer: Medicare Other | Admitting: Cardiovascular Disease

## 2017-01-31 VITALS — BP 84/60 | HR 73 | Ht 64.0 in | Wt 201.8 lb

## 2017-01-31 DIAGNOSIS — I5022 Chronic systolic (congestive) heart failure: Secondary | ICD-10-CM

## 2017-01-31 DIAGNOSIS — I2510 Atherosclerotic heart disease of native coronary artery without angina pectoris: Secondary | ICD-10-CM | POA: Diagnosis not present

## 2017-01-31 DIAGNOSIS — E785 Hyperlipidemia, unspecified: Secondary | ICD-10-CM

## 2017-01-31 MED ORDER — ATORVASTATIN CALCIUM 80 MG PO TABS: 80.0000 mg | ORAL_TABLET | Freq: Every day | ORAL | 3 refills | Status: DC

## 2017-01-31 MED ORDER — MIDODRINE HCL 5 MG PO TABS: 5.0000 mg | ORAL_TABLET | Freq: Two times a day (BID) | ORAL | 3 refills | Status: DC

## 2017-01-31 NOTE — Progress Notes (Signed)
Cardiology Office Note   Date:  01/31/2017   ID:  Jamie Arias, DOB May 16, 1955, MRN [id number]  PCP:  [name], MD  Cardiologist:   Kathlyn Sacramento, MD   Chief Complaint  Patient presents with  . OTHER    6 month f/u low BP (dialysis). Meds reviewed verbally with pt.      History of Present Illness: Jamie Arias is a 61 y.o. female who presents for a followup regarding coronary artery disease status post CABG and chronic diastolic heart failure. She has known history of diabetes, end-stage renal disease on hemodialysis, anemia, hypertension and hyperlipidemia.  She is status post CABG in April 2015.   She was started on dialysis in October 2017.  Dialysis days are Mondays Wednesdays and Fridays.  Antihypertensive medications were discontinued due to hypotension during dialysis. Ejection fraction was previously reduced but most recent echocardiogram in August 2017 showed normal LV systolic function, mild mitral regurgitation and mild pulmonary hypertension. She has been doing reasonably well with no recent chest pain or shortness of breath.  Biggest problem seems to be low blood pressure especially during dialysis.  She was started on Midodrin 5 mg twice daily to be taken on her dialysis days.  His blood pressure is low today at 84/60.  She had dialysis yesterday.   Past Medical History:  Diagnosis Date  . Anemia   . Chronic diastolic CHF (congestive heart failure) ([place])    a. Due to ischemic cardiomyopathy. EF as low as 35%, improved to normal s/p CABG; b. echo 07/06/13: EF 55-60%, no RWMAs, mod TR, trivial pericardial effusion not c/w tamponade physiology;  c. 10/2015 Echo: EF 65%, Gr1 DD, triv AI, mild MR, mildly dil LA, mod TR, PASP 37mHg.  .Marland KitchenCoronary artery disease    a. NSTEMI 06/2013; b.cath: severe three-vessel CAD w/ EF 30% & mild-mod MR; c. s/p 3 vessel CABG 07/02/13 (LIMA-LAD, SVG-OM, and SVG-RPDA);  d. 10/2015 MV: no ischemia/infarct.  . [place]  [place] complication [place]   . Diabetic neuropathy ([facility]   . D[facility] patient ([facility]    Tues, Thurs, Sat  . ESRD ([place] stage renal disease) [place]    a. 12/2015 initiated TTS dialysis.  .Marland KitchenGERD (gastroesophageal reflux disease)   . Hyperlipidemia   . Hypertension   . Hypothyroidism   . [place]ocardial infarction ([place]  . Neuropathy   . Pleural effusion 2015  . Pulmonary hypertension ([facility] [place]   . Wears dentures    full lower    Past Surgical History:  Procedure Laterality Date  . A/V FISTULAGRAM Right 12/17/2016   Procedure: A/V Fistulagram;  Surgeon: DAlgernon Huxley MD;  [facility];  Service: Cardiovascular;  Laterality: Right;  . A/V FISTULAGRAM Right 01/07/2017   Procedure: A/V Fistulagram;  Surgeon: DAlgernon Huxley MD;  [facility];  Service: Cardiovascular;  Laterality: Right;  . AV FISTULA PLACEMENT Right 02/03/2016   Procedure: INSERTION OF ARTERIOVENOUS (AV) GORE-TEX GRAFT ARM ( BRACH / AXILLARY );  Surgeon: GKatha Cabal MD;  Location: [facility];  Service: Vascular;  Laterality: Right;  . CARDIAC CATHETERIZATION    . CATARACT EXTRACTION Bilateral   . CHOLECYSTECTOMY N/A 12/09/2014   Procedure: LAPAROSCOPIC CHOLECYSTECTOMY;  Surgeon: CMarlyce [name] MD;  Location: [facility];  Service: General;  Laterality: N/A;  . CORONARY ARTERY BYPASS GRAFT N/A 07/02/2013   Procedure: CORONARY ARTERY BYPASS GRAFTING (CABG);  Surgeon: PIvin [name] MD;  [facility]  Service: Open Heart Surgery;  Laterality: N/A;  CABG x three, using left internal mammary artery and right leg greater saphenous vein harvested endoscopically  . ESOPHAGOGASTRODUODENOSCOPY (EGD) WITH PROPOFOL N/A 11/24/2015   Procedure: ESOPHAGOGASTRODUODENOSCOPY (EGD) WITH PROPOFOL;  Surgeon: [name], MD;  Location: [facility];  Service: Endoscopy;  Laterality: N/A;  Diabetic - insulin  . EYE SURGERY Bilateral    Cataract Extraction with IOL  . INTRAOPERATIVE  TRANSESOPHAGEAL ECHOCARDIOGRAM N/A 07/02/2013   Procedure: INTRAOPERATIVE TRANSESOPHAGEAL ECHOCARDIOGRAM;  Surgeon: Ivin [name], MD;  Location: [facility];  Service: Open Heart Surgery;  Laterality: N/A;  . PERIPHERAL VASCULAR CATHETERIZATION Right 12/06/2015   Procedure: Dialysis/Perma Catheter Insertion;  Surgeon: Katha Cabal, MD;  Location: [facility];  Service: Cardiovascular;  Laterality: Right;  . PERIPHERAL VASCULAR THROMBECTOMY Right 09/28/2016   Procedure: Peripheral Vascular Thrombectomy;  Surgeon: Katha Cabal, MD;  Location: [facility];  Service: Cardiovascular;  Laterality: Right;  . PORTA CATH REMOVAL N/A 06/01/2016   Procedure: Glori [name] Cath Removal;  Surgeon: Katha Cabal, MD;  Location: [facility];  Service: Cardiovascular;  Laterality: N/A;  . THORACENTESIS Left 2015     Current Outpatient Medications  Medication Sig Dispense Refill  . ACCU-CHEK FASTCLIX LANCETS MISC Check blood glucose twice daily, diagnosis code E11.9 204 each 3  . acetaminophen (TYLENOL) 325 MG tablet Take 650 mg by mouth daily as needed for moderate pain or headache.     Marland Kitchen aspirin EC 81 MG tablet Take 81 mg by mouth daily.    Marland Kitchen atorvastatin (LIPITOR) 80 MG tablet Take 1 tablet (80 mg total) by mouth daily. 90 tablet 3  . blood glucose meter kit and supplies KIT Dispense based on patient and insurance preference. Use up to twice daily. (FOR ICD-10 E11.9) 1 each 0  . ferrous sulfate 325 (65 FE) MG tablet Take 325 mg by mouth daily.     . furosemide (LASIX) 20 MG tablet Take 20 mg by mouth every other day. Non-dialysis days    . gabapentin (NEURONTIN) 300 MG capsule TAKE 1 CAPSULE(300 MG) BY MOUTH THREE TIMES DAILY 90 capsule 0  . glucose blood (CONTOUR NEXT TEST) test strip Please check blood glucose twice daily, diagnosis code E11.9 100 each 12  . Insulin Glargine (LANTUS SOLOSTAR) 100 UNIT/ML Solostar Pen Inject 14 Units into the skin daily at 10 pm. 15 mL 1  .  Insulin Glargine (TOUJEO MAX SOLOSTAR) 300 UNIT/ML SOPN Inject 14 Units into the skin daily. 1 pen 0  . Insulin Pen Needle (GLOBAL EASE INJECT PEN NEEDLES) 31G X 5 MM MISC Inject 0.1 mLs (10 Units total) into the skin at bedtime 90 each 2  . levothyroxine (SYNTHROID, LEVOTHROID) 75 MCG tablet Take 1 tablet (75 mcg total) by mouth daily. 90 tablet 1  . lidocaine-prilocaine (EMLA) cream Apply 1 application topically as needed (port access).    . metoCLOPramide (REGLAN) 5 MG tablet Take 1 tablet (5 mg total) by mouth every 8 (eight) hours as needed for nausea. 30 tablet 1  . midodrine (PROAMATINE) 5 MG tablet Take 1 tablet (5 mg total) by mouth 2 (two) times daily with a meal. 180 tablet 3  . nitroGLYCERIN (NITROSTAT) 0.4 MG SL tablet DISSOLVE ONE TABLET UNDER THE TONGUE EVERY 5 MINUTES AS NEEDED FOR CHEST PAIN.  DO NOT EXCEED A TOTAL OF 3 DOSES IN 15 MINUTES 25 tablet 0  . omeprazole (PRILOSEC) 20 MG capsule Take 40 mg by mouth 2 (  two) times daily.    . traZODone (DESYREL) 50 MG tablet TAKE 1 TABLET BY MOUTH EVERY NIGHT AT BEDTIME 30 tablet 2   No current facility-administered medications for this visit.     Allergies:   Nsaids    Social History:  The patient  reports that  has never smoked. she has never used smokeless tobacco. She reports that she does not drink alcohol or use drugs.   Family History:  The patient's family history includes COPD in her mother; Cancer in her mother; Diabetes in her father and paternal grandfather; Heart disease in her maternal grandmother; Pulmonary embolism in her father.    ROS:  Please see the history of present illness.   Otherwise, review of systems are positive for none.   All other systems are reviewed and negative.    PHYSICAL EXAM: VS:  BP (!) 84/60 (BP Location: Left Arm, Patient Position: Sitting, Cuff Size: Large)   Pulse 73   Ht '5\' 4"'$  (1.626 m)   Wt 201 lb 12 oz (91.5 kg)   BMI 34.63 kg/m  , BMI Body mass index is 34.63 kg/m. GEN: Well  nourished, well developed, in no acute distress  HEENT: normal  Neck: no JVD, carotid bruits, or masses Cardiac: RRR; no murmurs, rubs, or gallops, trace edema  Respiratory:  clear to auscultation bilaterally, normal work of breathing GI: soft, nontender, nondistended, + BS MS: no deformity or atrophy  Skin: warm and dry, no rash Neuro:  Strength and sensation are intact Psych: euthymic mood, full affect   EKG:  EKG is ordered today. EKG showed normal sinus rhythm with no significant ST or T wave changes.   Recent Labs: 02/03/2016: Hemoglobin 11.2; Potassium 3.1; Sodium 138 07/25/2016: TSH 1.36    Lipid Panel    Component Value Date/Time   CHOL 187 10/24/2015 0415   CHOL 171 12/18/2013 0815   TRIG 102 10/24/2015 0415   HDL 48 10/24/2015 0415   HDL 63 12/18/2013 0815   CHOLHDL 3.9 10/24/2015 0415   VLDL 20 10/24/2015 0415   LDLCALC 119 (H) 10/24/2015 0415   LDLCALC 93 12/18/2013 0815   LDLDIRECT 126.9 10/31/2012 0801      Wt Readings from Last 3 Encounters:  01/31/17 201 lb 12 oz (91.5 kg)  01/07/17 200 lb (90.7 kg)  01/01/17 200 lb (90.7 kg)       ASSESSMENT AND PLAN:  1.  Chronic systolic/diastolic heart failure: Most recent ejection fraction was normal .  She is off all antihypertensive medications due to low blood pressure during dialysis.  She appears to be euvolemic fluids are being managed mostly by dialysis.  2. Coronary artery disease involving native coronary arteries without angina: She is overall doing reasonably well.  Continue medical Therapy.  3.  Hypotension associated with dialysis: I agree with Midodrin.  Her blood pressure has been low even on her off dialysis days.  I asked her to start taking the medication twice daily every day.  4.  Hyperlipidemia: He ran out of atorvastatin.  I gave her refills.  5.  End-stage renal disease on hemodialysis: I advised her to consider evaluation for kidney transplant given her relatively young  age.  Disposition:   FU with me in 6 months  Signed,  Kathlyn Sacramento, MD  01/31/2017 10:22 AM    [facility]

## 2017-01-31 NOTE — Patient Instructions (Signed)
Medication Instructions:  Your physician has recommended you make the following change in your medication:  1- CHANGE Midodrine to 5 mg (1 tablet) by mouth two times a day.    Labwork: none  Testing/Procedures: none  Follow-Up: Your physician wants you to follow-up in: [place]. You will receive a reminder letter in the mail two months in advance. If you don't receive a letter, please call our office to schedule the follow-up appointment.   If you need a refill on your cardiac medications before your next appointment, please call your pharmacy.

## 2017-02-01 DIAGNOSIS — N186 End stage renal disease: Secondary | ICD-10-CM | POA: Diagnosis not present

## 2017-02-01 DIAGNOSIS — D631 Anemia in chronic kidney disease: Secondary | ICD-10-CM | POA: Diagnosis not present

## 2017-02-01 DIAGNOSIS — D509 Iron deficiency anemia, unspecified: Secondary | ICD-10-CM | POA: Diagnosis not present

## 2017-02-01 DIAGNOSIS — N2581 Secondary hyperparathyroidism of renal origin: Secondary | ICD-10-CM | POA: Diagnosis not present

## 2017-02-01 DIAGNOSIS — E1122 Type 2 diabetes mellitus with diabetic chronic kidney disease: Secondary | ICD-10-CM | POA: Diagnosis not present

## 2017-02-01 DIAGNOSIS — Z992 Dependence on renal dialysis: Secondary | ICD-10-CM | POA: Diagnosis not present

## 2017-02-04 ENCOUNTER — Other Ambulatory Visit: Payer: Self-pay | Admitting: Family Medicine

## 2017-02-04 DIAGNOSIS — N2581 Secondary hyperparathyroidism of renal origin: Secondary | ICD-10-CM | POA: Diagnosis not present

## 2017-02-04 DIAGNOSIS — D509 Iron deficiency anemia, unspecified: Secondary | ICD-10-CM | POA: Diagnosis not present

## 2017-02-04 DIAGNOSIS — Z23 Encounter for immunization: Secondary | ICD-10-CM | POA: Diagnosis not present

## 2017-02-04 DIAGNOSIS — N186 End stage renal disease: Secondary | ICD-10-CM | POA: Diagnosis not present

## 2017-02-04 DIAGNOSIS — D631 Anemia in chronic kidney disease: Secondary | ICD-10-CM | POA: Diagnosis not present

## 2017-02-04 DIAGNOSIS — E1122 Type 2 diabetes mellitus with diabetic chronic kidney disease: Secondary | ICD-10-CM | POA: Diagnosis not present

## 2017-02-06 DIAGNOSIS — Z23 Encounter for immunization: Secondary | ICD-10-CM | POA: Diagnosis not present

## 2017-02-06 DIAGNOSIS — D631 Anemia in chronic kidney disease: Secondary | ICD-10-CM | POA: Diagnosis not present

## 2017-02-06 DIAGNOSIS — N2581 Secondary hyperparathyroidism of renal origin: Secondary | ICD-10-CM | POA: Diagnosis not present

## 2017-02-06 DIAGNOSIS — D509 Iron deficiency anemia, unspecified: Secondary | ICD-10-CM | POA: Diagnosis not present

## 2017-02-06 DIAGNOSIS — N186 End stage renal disease: Secondary | ICD-10-CM | POA: Diagnosis not present

## 2017-02-06 DIAGNOSIS — E1122 Type 2 diabetes mellitus with diabetic chronic kidney disease: Secondary | ICD-10-CM | POA: Diagnosis not present

## 2017-02-07 ENCOUNTER — Telehealth: Payer: Self-pay | Admitting: Family Medicine

## 2017-02-07 NOTE — Telephone Encounter (Signed)
Please advise 

## 2017-02-07 NOTE — Telephone Encounter (Signed)
Copied from [place] [phone]. Topic: Quick Communication - See Telephone Encounter >> Feb 07, 2017  4:42 PM Boyd Kerbs wrote: CRM for notification. See Telephone encounter for:  Patient said trying to get prescription for cream for arm, needing authorization for pharmacist  Patient is out of medicine. 02/07/17.

## 2017-02-08 DIAGNOSIS — N186 End stage renal disease: Secondary | ICD-10-CM | POA: Diagnosis not present

## 2017-02-08 DIAGNOSIS — N2581 Secondary hyperparathyroidism of renal origin: Secondary | ICD-10-CM | POA: Diagnosis not present

## 2017-02-08 DIAGNOSIS — D631 Anemia in chronic kidney disease: Secondary | ICD-10-CM | POA: Diagnosis not present

## 2017-02-08 DIAGNOSIS — D509 Iron deficiency anemia, unspecified: Secondary | ICD-10-CM | POA: Diagnosis not present

## 2017-02-08 DIAGNOSIS — E1122 Type 2 diabetes mellitus with diabetic chronic kidney disease: Secondary | ICD-10-CM | POA: Diagnosis not present

## 2017-02-08 DIAGNOSIS — Z23 Encounter for immunization: Secondary | ICD-10-CM | POA: Diagnosis not present

## 2017-02-08 NOTE — Telephone Encounter (Signed)
Patient states it was the lidocaine cream for her port, she states her kidney doctor prescribed it so she will ask him to fill it.

## 2017-02-15 ENCOUNTER — Observation Stay
Admission: EM | Admit: 2017-02-15 | Discharge: 2017-02-16 | Disposition: A | Discharge status: Home / Self Care | Payer: Medicare Other | Attending: Internal Medicine | Admitting: Internal Medicine

## 2017-02-15 ENCOUNTER — Other Ambulatory Visit: Payer: Self-pay

## 2017-02-15 ENCOUNTER — Encounter: Payer: Self-pay | Admitting: Emergency Medicine

## 2017-02-15 DIAGNOSIS — E1142 Type 2 diabetes mellitus with diabetic polyneuropathy: Secondary | ICD-10-CM | POA: Diagnosis not present

## 2017-02-15 DIAGNOSIS — F329 Major depressive disorder, single episode, unspecified: Secondary | ICD-10-CM | POA: Insufficient documentation

## 2017-02-15 DIAGNOSIS — E875 Hyperkalemia: Secondary | ICD-10-CM | POA: Diagnosis not present

## 2017-02-15 DIAGNOSIS — E1122 Type 2 diabetes mellitus with diabetic chronic kidney disease: Secondary | ICD-10-CM | POA: Insufficient documentation

## 2017-02-15 DIAGNOSIS — N2581 Secondary hyperparathyroidism of renal origin: Secondary | ICD-10-CM | POA: Insufficient documentation

## 2017-02-15 DIAGNOSIS — I5032 Chronic diastolic (congestive) heart failure: Secondary | ICD-10-CM | POA: Insufficient documentation

## 2017-02-15 DIAGNOSIS — Z9115 Patient's noncompliance with renal dialysis: Secondary | ICD-10-CM | POA: Diagnosis not present

## 2017-02-15 DIAGNOSIS — Z836 Family history of other diseases of the respiratory system: Secondary | ICD-10-CM | POA: Insufficient documentation

## 2017-02-15 DIAGNOSIS — Z992 Dependence on renal dialysis: Secondary | ICD-10-CM | POA: Diagnosis not present

## 2017-02-15 DIAGNOSIS — I272 Pulmonary hypertension, unspecified: Secondary | ICD-10-CM | POA: Insufficient documentation

## 2017-02-15 DIAGNOSIS — Z7982 Long term (current) use of aspirin: Secondary | ICD-10-CM | POA: Insufficient documentation

## 2017-02-15 DIAGNOSIS — I132 Hypertensive heart and chronic kidney disease with heart failure and with stage 5 chronic kidney disease, or end stage renal disease: Secondary | ICD-10-CM | POA: Diagnosis not present

## 2017-02-15 DIAGNOSIS — Z79899 Other long term (current) drug therapy: Secondary | ICD-10-CM | POA: Diagnosis not present

## 2017-02-15 DIAGNOSIS — I2510 Atherosclerotic heart disease of native coronary artery without angina pectoris: Secondary | ICD-10-CM | POA: Diagnosis not present

## 2017-02-15 DIAGNOSIS — I252 Old myocardial infarction: Secondary | ICD-10-CM | POA: Insufficient documentation

## 2017-02-15 DIAGNOSIS — E785 Hyperlipidemia, unspecified: Secondary | ICD-10-CM | POA: Diagnosis not present

## 2017-02-15 DIAGNOSIS — Z9841 Cataract extraction status, right eye: Secondary | ICD-10-CM | POA: Insufficient documentation

## 2017-02-15 DIAGNOSIS — E119 Type 2 diabetes mellitus without complications: Secondary | ICD-10-CM | POA: Diagnosis not present

## 2017-02-15 DIAGNOSIS — I255 Ischemic cardiomyopathy: Secondary | ICD-10-CM | POA: Insufficient documentation

## 2017-02-15 DIAGNOSIS — Z886 Allergy status to analgesic agent status: Secondary | ICD-10-CM | POA: Insufficient documentation

## 2017-02-15 DIAGNOSIS — Z833 Family history of diabetes mellitus: Secondary | ICD-10-CM | POA: Insufficient documentation

## 2017-02-15 DIAGNOSIS — Z801 Family history of malignant neoplasm of trachea, bronchus and lung: Secondary | ICD-10-CM | POA: Insufficient documentation

## 2017-02-15 DIAGNOSIS — Z951 Presence of aortocoronary bypass graft: Secondary | ICD-10-CM | POA: Diagnosis not present

## 2017-02-15 DIAGNOSIS — D631 Anemia in chronic kidney disease: Secondary | ICD-10-CM | POA: Insufficient documentation

## 2017-02-15 DIAGNOSIS — Z9842 Cataract extraction status, left eye: Secondary | ICD-10-CM | POA: Insufficient documentation

## 2017-02-15 DIAGNOSIS — K219 Gastro-esophageal reflux disease without esophagitis: Secondary | ICD-10-CM | POA: Diagnosis not present

## 2017-02-15 DIAGNOSIS — I120 Hypertensive chronic kidney disease with stage 5 chronic kidney disease or end stage renal disease: Secondary | ICD-10-CM | POA: Diagnosis not present

## 2017-02-15 DIAGNOSIS — Z79890 Hormone replacement therapy (postmenopausal): Secondary | ICD-10-CM | POA: Diagnosis not present

## 2017-02-15 DIAGNOSIS — Z9049 Acquired absence of other specified parts of digestive tract: Secondary | ICD-10-CM | POA: Insufficient documentation

## 2017-02-15 DIAGNOSIS — Z8249 Family history of ischemic heart disease and other diseases of the circulatory system: Secondary | ICD-10-CM | POA: Insufficient documentation

## 2017-02-15 DIAGNOSIS — E039 Hypothyroidism, unspecified: Secondary | ICD-10-CM | POA: Insufficient documentation

## 2017-02-15 DIAGNOSIS — Z9889 Other specified postprocedural states: Secondary | ICD-10-CM | POA: Insufficient documentation

## 2017-02-15 DIAGNOSIS — Z794 Long term (current) use of insulin: Secondary | ICD-10-CM | POA: Diagnosis not present

## 2017-02-15 DIAGNOSIS — N186 End stage renal disease: Secondary | ICD-10-CM | POA: Diagnosis not present

## 2017-02-15 LAB — CBC WITH DIFFERENTIAL/PLATELET
Basophils Absolute: 0.1 10*3/uL (ref 0–0.1)
Basophils Relative: 1 %
Eosinophils Absolute: 0.2 10*3/uL (ref 0–0.7)
Eosinophils Relative: 3 %
HCT: 34.2 % — ABNORMAL LOW (ref 35.0–47.0)
Hemoglobin: 11.5 g/dL — ABNORMAL LOW (ref 12.0–16.0)
Lymphocytes Relative: 14 %
Lymphs Abs: 1.1 10*3/uL (ref 1.0–3.6)
MCH: 32.1 pg (ref 26.0–34.0)
MCHC: 33.6 g/dL (ref 32.0–36.0)
MCV: 95.5 fL (ref 80.0–100.0)
Monocytes Absolute: 0.4 10*3/uL (ref 0.2–0.9)
Monocytes Relative: 5 %
Neutro Abs: 5.9 10*3/uL (ref 1.4–6.5)
Neutrophils Relative %: 77 %
Platelets: 171 10*3/uL (ref 150–440)
RBC: 3.58 MIL/uL — ABNORMAL LOW (ref 3.80–5.20)
RDW: 15.5 % — ABNORMAL HIGH (ref 11.5–14.5)
WBC: 7.6 10*3/uL (ref 3.6–11.0)

## 2017-02-15 LAB — COMPREHENSIVE METABOLIC PANEL
ALT: 11 U/L — ABNORMAL LOW (ref 14–54)
AST: 19 U/L (ref 15–41)
Albumin: 3.7 g/dL (ref 3.5–5.0)
Alkaline Phosphatase: 38 U/L (ref 38–126)
Anion gap: 13 (ref 5–15)
BUN: 95 mg/dL — ABNORMAL HIGH (ref 6–20)
CO2: 26 mmol/L (ref 22–32)
Calcium: 9.2 mg/dL (ref 8.9–10.3)
Chloride: 98 mmol/L — ABNORMAL LOW (ref 101–111)
Creatinine, Ser: 11.72 mg/dL — ABNORMAL HIGH (ref 0.44–1.00)
GFR calc Af Amer: 3 mL/min — ABNORMAL LOW (ref >60)
GFR calc non Af Amer: 3 mL/min — ABNORMAL LOW (ref >60)
Glucose, Bld: 99 mg/dL (ref 65–99)
Potassium: 6 mmol/L — ABNORMAL HIGH (ref 3.5–5.1)
Sodium: 137 mmol/L (ref 135–145)
Total Bilirubin: 0.9 mg/dL (ref 0.3–1.2)
Total Protein: 7.5 g/dL (ref 6.5–8.1)

## 2017-02-15 LAB — GLUCOSE, CAPILLARY: Glucose-Capillary: 136 mg/dL — ABNORMAL HIGH (ref 65–99)

## 2017-02-15 LAB — MRSA PCR SCREENING: MRSA by PCR: NEGATIVE

## 2017-02-15 MED ORDER — NITROGLYCERIN 0.4 MG SL SUBL: 0.4000 mg | SUBLINGUAL_TABLET | SUBLINGUAL | Status: DC | PRN

## 2017-02-15 MED ORDER — INSULIN GLARGINE 100 UNIT/ML SOLOSTAR PEN: 14.0000 [IU] | PEN_INJECTOR | Freq: Every day | SUBCUTANEOUS | Status: DC

## 2017-02-15 MED ORDER — ACETAMINOPHEN 650 MG RE SUPP
650.0000 mg | Freq: Four times a day (QID) | RECTAL | Status: DC | PRN
Start: 2017-02-15 — End: 2017-02-16

## 2017-02-15 MED ORDER — SENNOSIDES-DOCUSATE SODIUM 8.6-50 MG PO TABS: 1.0000 | ORAL_TABLET | Freq: Every evening | ORAL | Status: DC | PRN

## 2017-02-15 MED ORDER — ASPIRIN EC 81 MG PO TBEC: 81.0000 mg | DELAYED_RELEASE_TABLET | Freq: Every day | ORAL | Status: DC

## 2017-02-15 MED ORDER — INSULIN GLARGINE 100 UNIT/ML ~~LOC~~ SOLN
14.0000 [IU] | Freq: Every day | SUBCUTANEOUS | Status: DC
  Administered 2017-02-15: 14 [IU] via SUBCUTANEOUS
  Filled 2017-02-15 (×2): qty 0.14

## 2017-02-15 MED ORDER — PATIROMER SORBITEX CALCIUM 8.4 G PO PACK
8.4000 g | PACK | Freq: Every day | ORAL | Status: DC
  Administered 2017-02-15: 8.4 g via ORAL
  Filled 2017-02-15 (×2): qty 4

## 2017-02-15 MED ORDER — SODIUM CHLORIDE 0.9% FLUSH: 3.0000 mL | INTRAVENOUS | Status: DC | PRN

## 2017-02-15 MED ORDER — CALCIUM CHLORIDE 10 % IV SOLN
1.0000 g | Freq: Once | INTRAVENOUS | Status: AC
  Administered 2017-02-15: 1 g via INTRAVENOUS
  Filled 2017-02-15: qty 10

## 2017-02-15 MED ORDER — LEVOTHYROXINE SODIUM 75 MCG PO TABS
75.0000 ug | ORAL_TABLET | Freq: Every day | ORAL | Status: DC
  Administered 2017-02-16: 75 ug via ORAL
  Filled 2017-02-15: qty 1

## 2017-02-15 MED ORDER — ATORVASTATIN CALCIUM 40 MG PO TABS
80.0000 mg | ORAL_TABLET | Freq: Every day | ORAL | Status: DC
  Filled 2017-02-15: qty 2

## 2017-02-15 MED ORDER — SODIUM CHLORIDE 0.9 % IV SOLN: 250.0000 mL | INTRAVENOUS | Status: DC | PRN

## 2017-02-15 MED ORDER — SODIUM CHLORIDE 0.9% FLUSH
3.0000 mL | Freq: Two times a day (BID) | INTRAVENOUS | Status: DC
  Administered 2017-02-15: 3 mL via INTRAVENOUS

## 2017-02-15 MED ORDER — HEPARIN SODIUM (PORCINE) 5000 UNIT/ML IJ SOLN
5000.0000 [IU] | Freq: Three times a day (TID) | INTRAMUSCULAR | Status: DC
  Administered 2017-02-15 – 2017-02-16 (×2): 5000 [IU] via SUBCUTANEOUS
  Filled 2017-02-15 (×2): qty 1

## 2017-02-15 MED ORDER — FUROSEMIDE 20 MG PO TABS: 20.0000 mg | ORAL_TABLET | ORAL | Status: DC

## 2017-02-15 MED ORDER — TRAZODONE HCL 50 MG PO TABS
50.0000 mg | ORAL_TABLET | Freq: Every day | ORAL | Status: DC
  Administered 2017-02-15: 50 mg via ORAL
  Filled 2017-02-15: qty 1

## 2017-02-15 MED ORDER — METOCLOPRAMIDE HCL 5 MG PO TABS: 5.0000 mg | ORAL_TABLET | Freq: Three times a day (TID) | ORAL | Status: DC | PRN

## 2017-02-15 MED ORDER — PANTOPRAZOLE SODIUM 40 MG PO TBEC
40.0000 mg | DELAYED_RELEASE_TABLET | Freq: Every day | ORAL | Status: DC
  Administered 2017-02-16: 40 mg via ORAL
  Filled 2017-02-15: qty 1

## 2017-02-15 MED ORDER — GABAPENTIN 300 MG PO CAPS
300.0000 mg | ORAL_CAPSULE | Freq: Two times a day (BID) | ORAL | Status: DC
  Administered 2017-02-15: 300 mg via ORAL
  Filled 2017-02-15: qty 1

## 2017-02-15 MED ORDER — MIDODRINE HCL 5 MG PO TABS
5.0000 mg | ORAL_TABLET | Freq: Two times a day (BID) | ORAL | Status: DC
  Administered 2017-02-15 – 2017-02-16 (×2): 5 mg via ORAL
  Filled 2017-02-15 (×3): qty 1

## 2017-02-15 MED ORDER — ACETAMINOPHEN 325 MG PO TABS: 650.0000 mg | ORAL_TABLET | Freq: Four times a day (QID) | ORAL | Status: DC | PRN

## 2017-02-15 NOTE — Progress Notes (Signed)
POST HD 

## 2017-02-15 NOTE — Care Management Obs Status (Signed)
MEDICARE OBSERVATION STATUS NOTIFICATION   Patient Details  Name: Jamie Arias MRN: [id number] Date of Birth: 1956-02-17   Medicare Observation Status Notification Given:  No(off the floor in HD)    Beverly Sessions, RN 02/15/2017, 3:34 PM

## 2017-02-15 NOTE — Progress Notes (Signed)
PRE HD ASSESSMENT 

## 2017-02-15 NOTE — ED Provider Notes (Signed)
[facility] Emergency Department Provider Note       Time seen: ----------------------------------------- 9:11 AM on 02/15/2017 -----------------------------------------   I have reviewed the triage vital signs and the nursing notes.  HISTORY   Chief Complaint needs dialysis    HPI Jamie Arias is a 61 y.o. female with a history of end-stage renal disease on dialysis who presents to the ED for needing dialysis.  Patient reports she missed 2 dialysis treatments on Monday and Wednesday due to inclement weather.  She states that the sidewalks around her apartment is covered in ice.  Her husband also is wearing a walking boot due to recent foot surgery and has not been able to get either.  She reports she has been shut in all week.  She states she feels fine but when she went to the dialysis center today they advised she needs to come to the ER for evaluation.  Past Medical History:  Diagnosis Date  . Anemia   . Chronic diastolic CHF (congestive heart failure) ([place])    a. Due to ischemic cardiomyopathy. EF as low as 35%, improved to normal s/p CABG; b. echo 07/06/13: EF 55-60%, no RWMAs, mod TR, trivial pericardial effusion not c/w tamponade physiology;  c. 10/2015 Echo: EF 65%, Gr1 DD, triv AI, mild MR, mildly dil LA, mod TR, PASP 50mmHg.  Marland Kitchen Coronary artery disease    a. NSTEMI 06/2013; b.cath: severe three-vessel CAD w/ EF 30% & mild-mod MR; c. s/p 3 vessel CABG 07/02/13 (LIMA-LAD, SVG-OM, and SVG-RPDA);  d. 10/2015 MV: no ischemia/infarct.  . Diabetes mellitus without complication ([place])   . Diabetic neuropathy ([place])   . Dialysis patient [facility])    Tues, Thurs, Sat  . ESRD (end stage renal disease) ([place])    a. 12/2015 initiated TTS dialysis.  Marland Kitchen GERD (gastroesophageal reflux disease)   . Hyperlipidemia   . Hypertension   . Hypothyroidism   . Myocardial infarction ([place]) 2015  . Neuropathy   . Pleural effusion 2015  . Pulmonary hypertension ([place])   . Wears  dentures    full lower    Patient Active Problem List   Diagnosis Date Noted  . Complication from renal dialysis device 12/01/2016  . ESRD on dialysis ([place]) 01/19/2016  . Nausea   . GI bleed 10/25/2015  . Hypertensive heart disease 10/24/2015  . Chronic diastolic CHF (congestive heart failure) ([place]) 10/24/2015  . Unstable angina ([place]) 10/23/2015  . Orthostatic hypotension 09/02/2015  . Chronic systolic CHF (congestive heart failure) ([place]) 09/02/2015  . Depression 07/27/2015  . Nausea without vomiting 07/07/2015  . Encounter for routine gynecological examination 07/06/2015  . Routine general medical examination at a health care facility 07/06/2015  . Cough 05/25/2015  . Calculus of gallbladder with chronic cholecystitis without obstruction   . Pre-operative cardiovascular examination 11/30/2014  . Bilateral carotid bruits 11/30/2014  . Low magnesium levels 08/10/2013  . Anemia 08/10/2013  . Constipation 08/10/2013  . Postoperative atrial fibrillation ([place]) 07/30/2013  . Coronary artery disease   . CAD (coronary artery disease) 07/02/2013  . Acute systolic heart failure ([place]) 06/30/2013  . NSTEMI (non-ST elevated myocardial infarction) ([place]) 06/29/2013  . Sleep disorder 05/23/2012  . Hypothyroid 04/27/2012  . HTN (hypertension) 04/27/2012  . HLD (hyperlipidemia) 04/27/2012  . Diabetes mellitus, type 2 ([place]) 04/27/2012  . Neuropathy 04/27/2012    Past Surgical History:  Procedure Laterality Date  . A/V FISTULAGRAM Right 12/17/2016   Procedure: A/V Fistulagram;  Surgeon: Algernon Huxley, MD;  Location: [facility];  Service: Cardiovascular;  Laterality: Right;  . A/V FISTULAGRAM Right 01/07/2017   Procedure: A/V Fistulagram;  Surgeon: Algernon Huxley, MD;  Location: [facility];  Service: Cardiovascular;  Laterality: Right;  . AV FISTULA PLACEMENT Right 02/03/2016   Procedure: INSERTION OF ARTERIOVENOUS (AV) GORE-TEX GRAFT ARM ( BRACH / AXILLARY );  Surgeon:  Katha Cabal, MD;  Location: [facility];  Service: Vascular;  Laterality: Right;  . CARDIAC CATHETERIZATION    . CATARACT EXTRACTION Bilateral   . CHOLECYSTECTOMY N/A 12/09/2014   Procedure: LAPAROSCOPIC CHOLECYSTECTOMY;  Surgeon: Marlyce [name], MD;  Location: [facility];  Service: General;  Laterality: N/A;  . CORONARY ARTERY BYPASS GRAFT N/A 07/02/2013   Procedure: CORONARY ARTERY BYPASS GRAFTING (CABG);  Surgeon: Ivin [name], MD;  Location: [facility];  Service: Open Heart Surgery;  Laterality: N/A;  CABG x three, using left internal mammary artery and right leg greater saphenous vein harvested endoscopically  . ESOPHAGOGASTRODUODENOSCOPY (EGD) WITH PROPOFOL N/A 11/24/2015   Procedure: ESOPHAGOGASTRODUODENOSCOPY (EGD) WITH PROPOFOL;  Surgeon: [name], MD;  Location: [facility];  Service: Endoscopy;  Laterality: N/A;  Diabetic - insulin  . EYE SURGERY Bilateral    Cataract Extraction with IOL  . INTRAOPERATIVE TRANSESOPHAGEAL ECHOCARDIOGRAM N/A 07/02/2013   Procedure: INTRAOPERATIVE TRANSESOPHAGEAL ECHOCARDIOGRAM;  Surgeon: Ivin [name], MD;  Location: [facility];  Service: Open Heart Surgery;  Laterality: N/A;  . PERIPHERAL VASCULAR CATHETERIZATION Right 12/06/2015   Procedure: Dialysis/Perma Catheter Insertion;  Surgeon: Katha Cabal, MD;  Location: [facility];  Service: Cardiovascular;  Laterality: Right;  . PERIPHERAL VASCULAR THROMBECTOMY Right 09/28/2016   Procedure: Peripheral Vascular Thrombectomy;  Surgeon: Katha Cabal, MD;  Location: [facility];  Service: Cardiovascular;  Laterality: Right;  . PORTA CATH REMOVAL N/A 06/01/2016   Procedure: Glori [name] Cath Removal;  Surgeon: Katha Cabal, MD;  Location: [facility];  Service: Cardiovascular;  Laterality: N/A;  . THORACENTESIS Left 2015    Allergies Nsaids  Social History Social History   Tobacco Use  . Smoking status: Never Smoker  . Smokeless tobacco: Never Used   Substance Use Topics  . Alcohol use: No    Alcohol/week: 0.0 oz  . Drug use: No    Review of Systems Constitutional: Negative for fever. Eyes: Negative for vision changes ENT:  Negative for congestion, sore throat Cardiovascular: Negative for chest pain. Respiratory: Negative for shortness of breath. Gastrointestinal: Negative for abdominal pain, vomiting and diarrhea. Musculoskeletal: Negative for back pain. Skin: Negative for rash. Neurological: Negative for headaches, focal weakness or numbness.  All systems negative/normal/unremarkable except as stated in the HPI  ____________________________________________   PHYSICAL EXAM:  VITAL SIGNS: ED Triage Vitals  Enc Vitals Group     BP 02/15/17 0846 (!) 143/64     Pulse Rate 02/15/17 0846 65     Resp 02/15/17 0846 16     Temp 02/15/17 0846 97.8 F (36.6 C)     Temp Source 02/15/17 0846 Oral     SpO2 02/15/17 0846 100 %     Weight 02/15/17 0844 201 lb (91.2 kg)     Height 02/15/17 0844 5\' 4"  (1.626 m)     Head Circumference --      Peak Flow --      Pain Score --      Pain Loc --      Pain Edu? --      Excl. in [place]? --  Constitutional: Alert and oriented. Well appearing and in no distress. Eyes: Conjunctivae are normal. Normal extraocular movements. ENT   Head: Normocephalic and atraumatic.   Nose: No congestion/rhinnorhea.   Mouth/Throat: Mucous membranes are moist.   Neck: No stridor. Cardiovascular: Normal rate, regular rhythm. No murmurs, rubs, or gallops. Respiratory: Normal respiratory effort without tachypnea nor retractions. Breath sounds are clear and equal bilaterally. No wheezes/rales/rhonchi. Gastrointestinal: Soft and nontender. Normal bowel sounds Musculoskeletal: Nontender with normal range of motion in extremities. No lower extremity tenderness nor edema. Neurologic:  Normal speech and language. No gross focal neurologic deficits are appreciated.  Skin:  Skin is warm, dry and intact.  No rash noted. Psychiatric: Mood and affect are normal. Speech and behavior are normal.  ____________________________________________  EKG: Interpreted by me.  Sinus rhythm the rate is 66 bpm, normal PR interval, normal QRS, normal QT.  ____________________________________________  ED COURSE:  Pertinent labs & imaging results that were available during my care of the patient were reviewed by me and considered in my medical decision making (see chart for details). Patient presents for end-stage renal disease needing dialysis, we will assess with labs as indicated.   Procedures ____________________________________________   LABS (pertinent positives/negatives)  Labs Reviewed  CBC WITH DIFFERENTIAL/PLATELET - Abnormal; Notable for the following components:      Result Value   RBC 3.58 (*)    Hemoglobin 11.5 (*)    HCT 34.2 (*)    RDW 15.5 (*)    All other components within normal limits  COMPREHENSIVE METABOLIC PANEL - Abnormal; Notable for the following components:   Potassium 6.0 (*)    Chloride 98 (*)    BUN 95 (*)    Creatinine, Ser 11.72 (*)    ALT 11 (*)    GFR calc non Af Amer 3 (*)    GFR calc Af Amer 3 (*)    All other components within normal limits  ___________________________________________ CRITICAL CARE Performed by: Earleen Newport   Total critical care time: 30 minutes  Critical care time was exclusive of separately billable procedures and treating other patients.  Critical care was necessary to treat or prevent imminent or life-threatening deterioration.  Critical care was time spent personally by me on the following activities: development of treatment plan with patient and/or surrogate as well as nursing, discussions with consultants, evaluation of patient's response to treatment, examination of patient, obtaining history from patient or surrogate, ordering and performing treatments and interventions, ordering and review of laboratory studies,  ordering and review of radiographic studies, pulse oximetry and re-evaluation of patient's condition.   DIFFERENTIAL DIAGNOSIS   End-stage renal disease, electrolyte abnormality, volume overload  FINAL ASSESSMENT AND PLAN  End-stage renal disease, hyperkalemia   Plan: Patient had presented needing dialysis today. Patient's labs did reveal significant hyperkalemia.  Although she looks well clinically with her hyperkalemia and her having missed her past 2 dialysis treatments, nephrology on-call would prefer that she be admitted for dialysis in the hospital.  She was given a dose of calcium gluconate as well as [place] to stabilize her from a hyperkalemia standpoint.   Earleen Newport, MD   Note: This note was generated in part or whole with voice recognition software. Voice recognition is usually quite accurate but there are transcription errors that can and very often do occur. I apologize for any typographical errors that were not detected and corrected.     Earleen Newport, MD 02/15/17 [phone]

## 2017-02-15 NOTE — Progress Notes (Signed)
HD TX ended  

## 2017-02-15 NOTE — H&P (Addendum)
[place] [facility] [place] [facility] [place] NAME: Jamie Arias    MR#:  [id number]  DATE OF BIRTH:  [birth year]  DATE OF ADMISSION:  02/15/2017  PRIMARY CARE PHYSICIAN: [name], MD   REQUESTING/REFERRING PHYSICIAN:   CHIEF COMPLAINT:   Chief Complaint  Patient presents with  . needs dialysis    HISTORY OF PRESENT ILLNESS: Jamie Arias  is a 61 y.o. female with a known history of end-stage renal disease on dialysis, anemia of chronic disease, chronic diastolic heart failure, coronary artery disease, diabetes mellitus, diabetic neuropathy, GERD, hypertension, hyperlipidemia, neuropathy who presented to the emergency room as she missed dialysis on Monday Wednesday.  Patient missed dialysis on Monday and Wednesday because of inclement weather secondary to snow.  A lot of snow and ice in the sidewalk as well as in the driveway patient could not get out and go to the dialysis center.  She called dialysis center today and advised patient to come to the emergency room.  She was evaluated in the emergency room her potassium level was high around 6.  No EKG changes noted.  No complaints of chest pain, palpitations and shortness of breath.  Patient received IV calcium chloride in the emergency room and oral patiromer.    PAST MEDICAL HISTORY:   Past Medical History:  Diagnosis Date  . Anemia   . Chronic diastolic CHF (congestive heart failure) ([place])    a. Due to ischemic cardiomyopathy. EF as low as 35%, improved to normal s/p CABG; b. echo 07/06/13: EF 55-60%, no RWMAs, mod TR, trivial pericardial effusion not c/w tamponade physiology;  c. 10/2015 Echo: EF 65%, Gr1 DD, triv AI, mild MR, mildly dil LA, mod TR, PASP 68mHg.  .Marland KitchenCoronary artery disease    a. NSTEMI 06/2013; b.cath: severe three-vessel CAD w/ EF 30% & mild-mod MR; c. s/p 3 vessel CABG 07/02/13 (LIMA-LAD, SVG-OM, and SVG-RPDA);  d. 10/2015 MV: no ischemia/infarct.  . Diabetes mellitus without complication  ([place]   . Diabetic neuropathy ([place]   . Dialysis patient ([facility]    Tues, Thurs, Sat  . ESRD (end stage renal disease) ([place]    a. 12/2015 initiated TTS dialysis.  .Marland KitchenGERD (gastroesophageal reflux disease)   . Hyperlipidemia   . Hypertension   . Hypothyroidism   . Myocardial infarction ([place] 2015  . Neuropathy   . Pleural effusion 2015  . Pulmonary hypertension ([place]   . Wears dentures    full lower    PAST SURGICAL HISTORY:  Past Surgical History:  Procedure Laterality Date  . A/V FISTULAGRAM Right 12/17/2016   Procedure: A/V Fistulagram;  Surgeon: DAlgernon Huxley MD;  [facility];  Service: Cardiovascular;  Laterality: Right;  . A/V FISTULAGRAM Right 01/07/2017   Procedure: A/V Fistulagram;  Surgeon: DAlgernon Huxley MD;  [facility];  Service: Cardiovascular;  Laterality: Right;  . AV FISTULA PLACEMENT Right 02/03/2016   Procedure: INSERTION OF ARTERIOVENOUS (AV) GORE-TEX GRAFT ARM ( BRACH / AXILLARY );  Surgeon: GKatha Cabal MD;  Location: [facility] OR[facility];  Service: Vascular;  Laterality: Right;  . CARDIAC CATHETERIZATION    . CATARACT EXTRACTION Bilateral   . CHOLECYSTECTOMY N/A 12/09/2014   Procedure: LAPAROSCOPIC CHOLECYSTECTOMY;  Surgeon: CMarlyce [name] MD;  Location: [facility] OR[facility];  Service: General;  Laterality: N/A;  . CORONARY ARTERY BYPASS GRAFT N/A 07/02/2013   Procedure: CORONARY ARTERY BYPASS GRAFTING (CABG);  Surgeon: PIvin [name] MD;  [place]  Service: Open Heart Surgery;  Laterality: N/A;  CABG x three, using left internal mammary artery and right leg greater saphenous vein harvested endoscopically  . ESOPHAGOGASTRODUODENOSCOPY (EGD) WITH PROPOFOL N/A 11/24/2015   Procedure: ESOPHAGOGASTRODUODENOSCOPY (EGD) WITH PROPOFOL;  Surgeon: [name], MD;  Location: [place];  Service: Endoscopy;  Laterality: N/A;  Diabetic - insulin  . EYE SURGERY Bilateral    Cataract Extraction with IOL  . INTRAOPERATIVE  TRANSESOPHAGEAL ECHOCARDIOGRAM N/A 07/02/2013   Procedure: INTRAOPERATIVE TRANSESOPHAGEAL ECHOCARDIOGRAM;  Surgeon: Ivin [name], MD;  Location: [place];  Service: Open Heart Surgery;  Laterality: N/A;  . PERIPHERAL VASCULAR CATHETERIZATION Right 12/06/2015   Procedure: Dialysis/Perma Catheter Insertion;  Surgeon: Katha Cabal, MD;  Location: [place] [facility];  Service: Cardiovascular;  Laterality: Right;  . PERIPHERAL VASCULAR THROMBECTOMY Right 09/28/2016   Procedure: Peripheral Vascular Thrombectomy;  Surgeon: Katha Cabal, MD;  Location: [place] [facility];  Service: Cardiovascular;  Laterality: Right;  . PORTA CATH REMOVAL N/A 06/01/2016   Procedure: Glori [name] Cath Removal;  Surgeon: Katha Cabal, MD;  Location: [place] [facility] LAB;  Service: Cardiovascular;  Laterality: N/A;  . THORACENTESIS Left 2015    SOCIAL HISTORY:  Social History   Tobacco Use  . Smoking status: Never Smoker  . Smokeless tobacco: Never Used  Substance Use Topics  . Alcohol use: No    Alcohol/week: 0.0 oz    FAMILY HISTORY:  Family History  Problem Relation Age of Onset  . COPD Mother   . Cancer Mother        Lung  . Pulmonary embolism Father   . Diabetes Father   . Diabetes Paternal Grandfather   . Heart disease Maternal Grandmother   . Colon cancer Neg Hx   . Colon polyps Neg Hx   . Esophageal cancer Neg Hx   . Pancreatic cancer Neg Hx   . Liver disease Neg Hx     DRUG ALLERGIES:  Allergies  Allergen Reactions  . Nsaids Other (See Comments)    Contraindicated due to kidney disease.    REVIEW OF SYSTEMS:   CONSTITUTIONAL: No fever, fatigue or weakness.  EYES: No blurred or double vision.  EARS, NOSE, AND THROAT: No tinnitus or ear pain.  RESPIRATORY: No cough, shortness of breath, wheezing or hemoptysis.  CARDIOVASCULAR: No chest pain, orthopnea, edema.  GASTROINTESTINAL: No nausea, vomiting, diarrhea or abdominal pain.  GENITOURINARY: No dysuria, hematuria.   ENDOCRINE: No polyuria, nocturia,  HEMATOLOGY: No anemia, easy bruising or bleeding SKIN: No rash or lesion. MUSCULOSKELETAL: No joint pain or arthritis.   NEUROLOGIC: No tingling, numbness, weakness.  PSYCHIATRY: No anxiety or depression.   MEDICATIONS AT HOME:  Prior to Admission medications   Medication Sig Start Date End Date Taking? Authorizing Provider  aspirin EC 81 MG tablet Take 81 mg by mouth daily.   Yes [provider]  atorvastatin (LIPITOR) 80 MG tablet Take 1 tablet (80 mg total) by mouth daily. 01/31/17  Yes Wellington Hampshire, MD  furosemide (LASIX) 20 MG tablet Take 20 mg by mouth every other day. Non-dialysis days   Yes [provider]  gabapentin (NEURONTIN) 300 MG capsule TAKE 1 CAPSULE(300 MG) BY MOUTH THREE TIMES DAILY 02/05/17  Yes [name], MD  Insulin Glargine (LANTUS SOLOSTAR) 100 UNIT/ML Solostar Pen Inject 14 Units into the skin daily at 10 pm. 12/21/16  Yes [name], MD  levothyroxine (SYNTHROID, LEVOTHROID) 75 MCG tablet Take 1 tablet (75 mcg total) by mouth  daily. 05/01/16  Yes [name], MD  midodrine (PROAMATINE) 5 MG tablet Take 1 tablet (5 mg total) by mouth 2 (two) times daily with a meal. 01/31/17  Yes Wellington Hampshire, MD  omeprazole (PRILOSEC) 20 MG capsule Take 40 mg by mouth 2 (two) times daily.   Yes [provider]  traZODone (DESYREL) 50 MG tablet TAKE 1 TABLET BY MOUTH EVERY NIGHT AT BEDTIME 01/09/17  Yes [name], MD  ACCU-CHEK FASTCLIX LANCETS MISC Check blood glucose twice daily, diagnosis code E11.9 12/13/16   [name], MD  acetaminophen (TYLENOL) 325 MG tablet Take 650 mg by mouth daily as needed for moderate pain or headache.     [provider]  blood glucose meter kit and supplies KIT Dispense based on patient and insurance preference. Use up to twice daily. (FOR ICD-10 E11.9) 07/01/15   Rubbie Battiest, RN  glucose blood (CONTOUR NEXT TEST) test strip Please  check blood glucose twice daily, diagnosis code E11.9 12/13/16   [name], MD  Insulin Glargine (TOUJEO MAX SOLOSTAR) 300 UNIT/ML SOPN Inject 14 Units into the skin daily. Patient not taking: Reported on 02/15/2017 12/21/16   [name], MD  Insulin Pen Needle (GLOBAL EASE INJECT PEN NEEDLES) 31G X 5 MM MISC Inject 0.1 mLs (10 Units total) into the skin at bedtime 01/12/16   [name], MD  lidocaine-prilocaine (EMLA) cream Apply 1 application topically as needed (port access).    [provider]  metoCLOPramide (REGLAN) 5 MG tablet Take 1 tablet (5 mg total) by mouth every 8 (eight) hours as needed for nausea. Patient not taking: Reported on 02/15/2017 01/20/16   [name], MD  nitroGLYCERIN (NITROSTAT) 0.4 MG SL tablet DISSOLVE ONE TABLET UNDER THE TONGUE EVERY 5 MINUTES AS NEEDED FOR CHEST PAIN.  DO NOT EXCEED A TOTAL OF 3 DOSES IN 15 MINUTES 01/18/16   Wellington Hampshire, MD      PHYSICAL EXAMINATION:   VITAL SIGNS: Blood pressure (!) 143/64, pulse 65, temperature 97.8 F (36.6 C), temperature source Oral, resp. rate 16, height _0  (1.626 m), weight 91.2 kg (201 lb), SpO2 100 %.  GENERAL:  61 y.o.-year-old patient lying in the bed with no acute distress.  EYES: Pupils equal, round, reactive to light and accommodation. No scleral icterus. Extraocular muscles intact.  HEENT: Head atraumatic, normocephalic. Oropharynx and nasopharynx clear.  NECK:  Supple, no jugular venous distention. No thyroid enlargement, no tenderness.  LUNGS: Normal breath sounds bilaterally, no wheezing, rales,rhonchi or crepitation. No use of accessory muscles of respiration.  CARDIOVASCULAR: S1, S2 normal. No murmurs, rubs, or gallops.  ABDOMEN: Soft, nontender, nondistended. Bowel sounds present. No organomegaly or mass.  EXTREMITIES: No pedal edema, cyanosis, or clubbing.  NEUROLOGIC: Cranial nerves II through XII are intact. Muscle strength 5/5 in all extremities.  Sensation intact. Gait not checked.  PSYCHIATRIC: The patient is alert and oriented x 3.  SKIN: No obvious rash, lesion, or ulcer.   LABORATORY PANEL:   CBC Recent Labs  Lab 02/15/17 0853  WBC 7.6  HGB 11.5*  HCT 34.2*  PLT 171  MCV 95.5  MCH 32.1  MCHC 33.6  RDW 15.5*  LYMPHSABS 1.1  MONOABS 0.4  EOSABS 0.2  BASOSABS 0.1   ------------------------------------------------------------------------------------------------------------------  Chemistries  Recent Labs  Lab 02/15/17 0853  NA 137  K 6.0*  CL 98*  CO2 26  GLUCOSE 99  BUN 95*  CREATININE 11.72*  CALCIUM 9.2  AST 19  ALT 11*  ALKPHOS 38  BILITOT 0.9   ------------------------------------------------------------------------------------------------------------------ estimated creatinine clearance is 5.4 mL/min (A) (by C-G formula based on SCr of 11.72 mg/dL (H)). ------------------------------------------------------------------------------------------------------------------ No results for input(s): TSH, T4TOTAL, T3FREE, THYROIDAB in the last 72 hours.  Invalid input(s): FREET3   Coagulation profile No results for input(s): INR, PROTIME in the last 168 hours. ------------------------------------------------------------------------------------------------------------------- No results for input(s): DDIMER in the last 72 hours. -------------------------------------------------------------------------------------------------------------------  Cardiac Enzymes No results for input(s): CKMB, TROPONINI, MYOGLOBIN in the last 168 hours.  Invalid input(s): CK ------------------------------------------------------------------------------------------------------------------ Invalid input(s): POCBNP  ---------------------------------------------------------------------------------------------------------------  Urinalysis    Component Value Date/Time   COLORURINE YELLOW (A) 10/23/2015 0503   APPEARANCEUR  CLEAR (A) 10/23/2015 0503   APPEARANCEUR Hazy 07/23/2013 0741   LABSPEC 1.013 10/23/2015 0503   LABSPEC 1.016 07/23/2013 0741   PHURINE 7.0 10/23/2015 0503   GLUCOSEU >500 (A) 10/23/2015 0503   GLUCOSEU Negative 07/23/2013 0741   HGBUR 1+ (A) 10/23/2015 0503   BILIRUBINUR NEGATIVE 10/23/2015 0503   BILIRUBINUR Negative 07/23/2013 0741   KETONESUR TRACE (A) 10/23/2015 0503   PROTEINUR >500 (A) 10/23/2015 0503   UROBILINOGEN 0.2 06/30/2013 2213   NITRITE NEGATIVE 10/23/2015 0503   LEUKOCYTESUR NEGATIVE 10/23/2015 0503   LEUKOCYTESUR Negative 07/23/2013 0741     RADIOLOGY: No results found.  EKG: Orders placed or performed during the hospital encounter of 02/15/17  . ED EKG  . ED EKG  . EKG 12-Lead  . EKG 12-Lead    IMPRESSION AND PLAN: 61 year old female patient with history of coronary artery disease, congestive heart failure diastolic, end-stage renal disease on dialysis, diabetes mellitus, hypertension, hyperlipidemia presented to the emergency room after missing dialysis.  Admitting diagnosis 1.  Hyperkalemia 2.  End-stage renal disease on dialysis 3.Chronic diastolic heart failure 4.  Diabetes mellitus type 2 5.  Hypertension 6.  Anemia of chronic disease Treatment plan Admit patient to telemetry observation bed Nephrology consultation for dialysis Follow potassium level Patient received IV calcium chloride and oral patiromer DVT prophylaxis subcu heparin Resume home medications  All the records are reviewed and case discussed with ED provider. Management plans discussed with the patient, family and they are in agreement.  CODE STATUS:FULL CODE Code Status History    Date Active Date Inactive Code Status Order ID Comments User Context   10/23/2015 08:28 10/23/2015 09:08 Full Code [id number]  Saundra Shelling, MD Inpatient   09/03/2015 01:30 09/03/2015 16:19 Full Code [id number]  Lance Coon, MD Inpatient   07/02/2013 13:27 07/14/2013 15:40 Full Code [id number]   Nani Skillern, PA-C Inpatient   06/29/2013 21:53 07/02/2013 13:27 Full Code [id number]  Larey Dresser, MD Inpatient       TOTAL TIME TAKING CARE OF THIS PATIENT: 52 minutes.    Saundra Shelling M.D on 02/15/2017 at 10:52 AM  Between 7am to 6pm - Pager - [phone]  After 6pm go to [email or web address] - password [email or web address] [facility]  [place] [facility]  Office  [phone]  CC: Primary care physician; [name], MD

## 2017-02-15 NOTE — ED Triage Notes (Signed)
Sent to ED by [facility] for ED evaluation and dialysis.  Patient has missed 2 dialysis treatments due to weather and being unable to get out of apartment.  Patient denies complaint.  Typical dialysis schedule M-W-F

## 2017-02-15 NOTE — Care Management (Signed)
Jamie Arias HD liaison notified

## 2017-02-15 NOTE — Progress Notes (Signed)
[place]

## 2017-02-15 NOTE — Progress Notes (Signed)
[facility]  ROUNDING NOTE   Subjective:  Patient well known to [place].  She started dialysis in October of 2017. She last had HD last Friday. Was unable to get to the center this week due to inclement weather and inability to get out of her home. She was instructed to come here subsequently. Patient found to have hyperkalemia on labs.    Objective:  Vital signs in last 24 hours:  Temp:  [97.8 F (36.6 C)] 97.8 F (36.6 C) (12/14 1247) Pulse Rate:  [62-70] 68 (12/14 1247) Resp:  [13-20] 20 (12/14 1247) BP: (143-195)/(64-81) 166/78 (12/14 1247) SpO2:  [99 %-100 %] 100 % (12/14 1247) Weight:  [91.2 kg (201 lb)-93.6 kg (206 lb 6.4 oz)] 93.6 kg (206 lb 6.4 oz) (12/14 1247)  Weight change:  Filed Weights   02/15/17 0844 02/15/17 1247  Weight: 91.2 kg (201 lb) 93.6 kg (206 lb 6.4 oz)    Intake/Output: No intake/output data recorded.   Intake/Output this shift:  Total I/O In: 100 [IV Piggyback:100] Out: -   Physical Exam: General: No acute distress  Head: Normocephalic, atraumatic. Moist oral mucosal membranes  Eyes: Anicteric  Neck: Supple, trachea midline  Lungs:  Clear to auscultation, normal effort  Heart: S1S2 no rubs  Abdomen:  Soft, nontender, bowel sounds present  Extremities: Trace peripheral edema.  Neurologic: Awake, alert, following commands  Skin: No lesions  Access: RUE AVF    Basic Metabolic Panel: Recent Labs  Lab 02/15/17 0853  NA 137  K 6.0*  CL 98*  CO2 26  GLUCOSE 99  BUN 95*  CREATININE 11.72*  CALCIUM 9.2    Liver Function Tests: Recent Labs  Lab 02/15/17 0853  AST 19  ALT 11*  ALKPHOS 38  BILITOT 0.9  PROT 7.5  ALBUMIN 3.7   No results for input(s): LIPASE, AMYLASE in the last 168 hours. No results for input(s): AMMONIA in the last 168 hours.  CBC: Recent Labs  Lab 02/15/17 0853  WBC 7.6  NEUTROABS 5.9  HGB 11.5*  HCT 34.2*  MCV 95.5  PLT 171    Cardiac Enzymes: No results for input(s): CKTOTAL,  CKMB, CKMBINDEX, TROPONINI in the last 168 hours.  BNP: Invalid input(s): POCBNP  CBG: No results for input(s): GLUCAP in the last 168 hours.  Microbiology: Results for orders placed or performed during the hospital encounter of 01/30/16  Surgical pcr screen     Status: None   Collection Time: 01/30/16  2:16 PM  Result Value Ref Range Status   MRSA, PCR NEGATIVE NEGATIVE Final   Staphylococcus aureus NEGATIVE NEGATIVE Final    Comment:        The Xpert SA Assay (FDA approved for NASAL specimens in patients over 69 years of age), is one component of a comprehensive surveillance program.  Test performance has been validated by [facility] for patients greater than or equal to 28 year old. It is not intended to diagnose infection nor to guide or monitor treatment.     Coagulation Studies: No results for input(s): LABPROT, INR in the last 72 hours.  Urinalysis: No results for input(s): COLORURINE, LABSPEC, PHURINE, GLUCOSEU, HGBUR, BILIRUBINUR, KETONESUR, PROTEINUR, UROBILINOGEN, NITRITE, LEUKOCYTESUR in the last 72 hours.  Invalid input(s): APPERANCEUR    Imaging: No results found.   Medications:   . sodium chloride     . [START ON 02/16/2017] aspirin EC  81 mg Oral Daily  . [START ON 02/16/2017] atorvastatin  80 mg Oral Daily  . [  START ON 02/16/2017] furosemide  20 mg Oral QODAY  . gabapentin  300 mg Oral BID  . heparin  5,000 Units Subcutaneous Q8H  . insulin glargine  14 Units Subcutaneous QHS  . [START ON 02/16/2017] levothyroxine  75 mcg Oral Daily  . midodrine  5 mg Oral BID WC  . [START ON 02/16/2017] pantoprazole  40 mg Oral Daily  . patiromer  8.4 g Oral Daily  . sodium chloride flush  3 mL Intravenous Q12H  . traZODone  50 mg Oral QHS   sodium chloride, acetaminophen **OR** acetaminophen, metoCLOPramide, nitroGLYCERIN, senna-docusate, sodium chloride flush  Assessment/ Plan:  61 y.o. female with past medical history of diabetes mellitus type 2,  hypertension, diastolic heart failure, ESRD on HD since 10/17, GERD, hyperlipidemia, peripheral neuropathy, coronary artery disease who presents now with hyperkalemia after missing dialysis.  1.  Hyperkalemia.  2.  Anemia of CKD.  3.  Secondary hyperparathyroidism.  Plan:  Patient was given patiromer in the emergency department.  She will be receiving dialysis later today for further treatment of hyperkalemia.  With the improvement in weather she should hopefully be able to get to her dialysis center early next week thereafter.  Reasses serum K tomorrow.  Further input as patient progresses.    LOS: 0 Harveen Flesch 12/14/20182:51 PM

## 2017-02-16 DIAGNOSIS — N186 End stage renal disease: Secondary | ICD-10-CM | POA: Diagnosis not present

## 2017-02-16 DIAGNOSIS — E119 Type 2 diabetes mellitus without complications: Secondary | ICD-10-CM | POA: Diagnosis not present

## 2017-02-16 DIAGNOSIS — E875 Hyperkalemia: Secondary | ICD-10-CM | POA: Diagnosis not present

## 2017-02-16 DIAGNOSIS — D631 Anemia in chronic kidney disease: Secondary | ICD-10-CM | POA: Diagnosis not present

## 2017-02-16 DIAGNOSIS — I5032 Chronic diastolic (congestive) heart failure: Secondary | ICD-10-CM | POA: Diagnosis not present

## 2017-02-16 DIAGNOSIS — N2581 Secondary hyperparathyroidism of renal origin: Secondary | ICD-10-CM | POA: Diagnosis not present

## 2017-02-16 LAB — CBC
HCT: 31.9 % — ABNORMAL LOW (ref 35.0–47.0)
Hemoglobin: 10.7 g/dL — ABNORMAL LOW (ref 12.0–16.0)
MCH: 32 pg (ref 26.0–34.0)
MCHC: 33.7 g/dL (ref 32.0–36.0)
MCV: 95 fL (ref 80.0–100.0)
Platelets: 151 10*3/uL (ref 150–440)
RBC: 3.36 MIL/uL — ABNORMAL LOW (ref 3.80–5.20)
RDW: 15.5 % — ABNORMAL HIGH (ref 11.5–14.5)
WBC: 5.9 10*3/uL (ref 3.6–11.0)

## 2017-02-16 LAB — BASIC METABOLIC PANEL
Anion gap: 10 (ref 5–15)
BUN: 61 mg/dL — ABNORMAL HIGH (ref 6–20)
CO2: 28 mmol/L (ref 22–32)
Calcium: 8.9 mg/dL (ref 8.9–10.3)
Chloride: 98 mmol/L — ABNORMAL LOW (ref 101–111)
Creatinine, Ser: 8.91 mg/dL — ABNORMAL HIGH (ref 0.44–1.00)
GFR calc Af Amer: 5 mL/min — ABNORMAL LOW (ref >60)
GFR calc non Af Amer: 4 mL/min — ABNORMAL LOW (ref >60)
Glucose, Bld: 115 mg/dL — ABNORMAL HIGH (ref 65–99)
Potassium: 5.5 mmol/L — ABNORMAL HIGH (ref 3.5–5.1)
Sodium: 136 mmol/L (ref 135–145)

## 2017-02-16 MED ORDER — DIPHENOXYLATE-ATROPINE 2.5-0.025 MG PO TABS
1.0000 | ORAL_TABLET | Freq: Four times a day (QID) | ORAL | Status: DC | PRN
  Administered 2017-02-16: 1 via ORAL
  Filled 2017-02-16: qty 1

## 2017-02-16 NOTE — Progress Notes (Signed)
Post HD assessment  

## 2017-02-16 NOTE — Progress Notes (Signed)
Pre HD Assessment  

## 2017-02-16 NOTE — Progress Notes (Signed)
HD Ended

## 2017-02-16 NOTE — Discharge Summary (Signed)
[facility] [place] NAME: Jamie Arias    MR#:  [id number]  DATE OF BIRTH:  1955-09-23  DATE OF ADMISSION:  02/15/2017 ADMITTING PHYSICIAN: Saundra Shelling, MD  DATE OF DISCHARGE: 02/16/17  PRIMARY CARE PHYSICIAN: [name], MD    ADMISSION DIAGNOSIS:  End stage renal disease ([place]) [N18.6] Hyperkalemia [E87.5]  DISCHARGE DIAGNOSIS:  Acute hyperkalemia secondary to missing hemodialysis due to inclement weather  SECONDARY DIAGNOSIS:   Past Medical History:  Diagnosis Date  . Anemia   . Chronic diastolic CHF (congestive heart failure) ([place])    a. Due to ischemic cardiomyopathy. EF as low as 35%, improved to normal s/p CABG; b. echo 07/06/13: EF 55-60%, no RWMAs, mod TR, trivial pericardial effusion not c/w tamponade physiology;  c. 10/2015 Echo: EF 65%, Gr1 DD, triv AI, mild MR, mildly dil LA, mod TR, PASP 79mHg.  .Marland KitchenCoronary artery disease    a. NSTEMI 06/2013; b.cath: severe three-vessel CAD w/ EF 30% & mild-mod MR; c. s/p 3 vessel CABG 07/02/13 (LIMA-LAD, SVG-OM, and SVG-RPDA);  d. 10/2015 MV: no ischemia/infarct.  . Diabetes mellitus without complication ([facility]   . Diabetic neuropathy ([facility]   . Dialysis patient ([facility]    Tues, Thurs, Sat  . ESRD (end stage renal disease) ([facility]    a. 12/2015 initiated TTS dialysis.  .Marland KitchenGERD (gastroesophageal reflux disease)   . Hyperlipidemia   . Hypertension   . Hypothyroidism   . [place]ocardial infarction ([facility] [place]  . Neuropathy   . Pleural effusion 2015  . Pulmonary hypertension ([place]   . Wears dentures    full lower    HOSPITAL COURSE:  LDanira Arias is a 61y.o. female with a known history of end-stage renal disease on dialysis, anemia of chronic disease, chronic diastolic heart failure, coronary artery disease, diabetes mellitus, diabetic neuropathy, GERD, hypertension, hyperlipidemia, neuropathy who presented to the emergency room as she missed dialysis on Monday Wednesday.  Patient  missed dialysis on Monday and Wednesday because of inclement weather secondary to snow.  She was found to have potassium of 6.0.  No EKG changes noted.  1.  Acute hyperkalemia secondary to missing hemodialysis 2 sessions due to inclement weather with snow -Got dialyzed yesterday potassium is down to 5.5 -pt  recieved valtessa while in the hospital -no acute EKG changes  2.ESRD on HD  3. HTN resumed home meds  4. DVT prophylaxis on heparin   CONSULTS OBTAINED:  Treatment Team:  LAnthonette Legato MD  DRUG ALLERGIES:   Allergies  Allergen Reactions  . Nsaids Other (See Comments)    Contraindicated due to kidney disease.    DISCHARGE MEDICATIONS:   Allergies as of 02/16/2017      Reactions   Nsaids Other (See Comments)   Contraindicated due to kidney disease.      Medication List    TAKE these medications   ACCU-CHEK FASTCLIX LANCETS Misc Check blood glucose twice daily, diagnosis code E11.9   acetaminophen 325 MG tablet Commonly known as:  TYLENOL Take 650 mg by mouth daily as needed for moderate pain or headache.   aspirin EC 81 MG tablet Take 81 mg by mouth daily.   atorvastatin 80 MG tablet Commonly known as:  LIPITOR Take 1 tablet (80 mg total) by mouth daily.   blood glucose meter kit and supplies Kit Dispense based on patient and insurance preference. Use up to twice daily. (FOR ICD-10 E11.9)   furosemide 20 MG tablet  Commonly known as:  LASIX Take 20 mg by mouth every other day. Non-dialysis days   gabapentin 300 MG capsule Commonly known as:  NEURONTIN TAKE 1 CAPSULE(300 MG) BY MOUTH THREE TIMES DAILY   glucose blood test strip Commonly known as:  CONTOUR NEXT TEST Please check blood glucose twice daily, diagnosis code E11.9   Insulin Glargine 100 UNIT/ML Solostar Pen Commonly known as:  LANTUS SOLOSTAR Inject 14 Units into the skin daily at 10 pm. What changed:  Another medication with the same name was removed. Continue taking this  medication, and follow the directions you see here.   Insulin Pen Needle 31G X 5 MM Misc Commonly known as:  GLOBAL EASE INJECT PEN NEEDLES Inject 0.1 mLs (10 Units total) into the skin at bedtime   levothyroxine 75 MCG tablet Commonly known as:  SYNTHROID, LEVOTHROID Take 1 tablet (75 mcg total) by mouth daily.   lidocaine-prilocaine cream Commonly known as:  EMLA Apply 1 application topically as needed (port access).   metoCLOPramide 5 MG tablet Commonly known as:  REGLAN Take 1 tablet (5 mg total) by mouth every 8 (eight) hours as needed for nausea.   midodrine 5 MG tablet Commonly known as:  PROAMATINE Take 1 tablet (5 mg total) by mouth 2 (two) times daily with a meal.   nitroGLYCERIN 0.4 MG SL tablet Commonly known as:  NITROSTAT DISSOLVE ONE TABLET UNDER THE TONGUE EVERY 5 MINUTES AS NEEDED FOR CHEST PAIN.  DO NOT EXCEED A TOTAL OF 3 DOSES IN 15 MINUTES   omeprazole 20 MG capsule Commonly known as:  PRILOSEC Take 40 mg by mouth 2 (two) times daily.   traZODone 50 MG tablet Commonly known as:  DESYREL TAKE 1 TABLET BY MOUTH EVERY NIGHT AT BEDTIME       If you experience worsening of your admission symptoms, develop shortness of breath, life threatening emergency, suicidal or homicidal thoughts you must seek medical attention immediately by calling 911 or calling your MD immediately  if symptoms less severe.  You Must read complete instructions/literature along with all the possible adverse reactions/side effects for all the Medicines you take and that have been prescribed to you. Take any new Medicines after you have completely understood and accept all the possible adverse reactions/side effects.   Please note  You were cared for by a hospitalist during your hospital stay. If you have any questions about your discharge medications or the care you received while you were in the hospital after you are discharged, you can call the unit and asked to speak with the  hospitalist on call if the hospitalist that took care of you is not available. Once you are discharged, your primary care physician will handle any further medical issues. Please note that NO REFILLS for any discharge medications will be authorized once you are discharged, as it is imperative that you return to your primary care physician (or establish a relationship with a primary care physician if you do not have one) for your aftercare needs so that they can reassess your need for medications and monitor your lab values. Today   SUBJECTIVE   Doing well Seen at HD  VITAL SIGNS:  Blood pressure (!) 145/60, pulse 68, temperature 98.4 F (36.9 C), temperature source Oral, resp. rate 16, height _0  (1.626 m), weight 93 kg (205 lb 0.4 oz), SpO2 99 %.  I/O:    Intake/Output Summary (Last 24 hours) at 02/16/2017 1225 Last data filed at 02/16/2017 1011 Gross per  24 hour  Intake 240 ml  Output 1300 ml  Net -1060 ml    PHYSICAL EXAMINATION:  GENERAL:  61 y.o.-year-old patient lying in the bed with no acute distress.  EYES: Pupils equal, round, reactive to light and accommodation. No scleral icterus. Extraocular muscles intact.  HEENT: Head atraumatic, normocephalic. Oropharynx and nasopharynx clear.  NECK:  Supple, no jugular venous distention. No thyroid enlargement, no tenderness.  LUNGS: Normal breath sounds bilaterally, no wheezing, rales,rhonchi or crepitation. No use of accessory muscles of respiration.  CARDIOVASCULAR: S1, S2 normal. No murmurs, rubs, or gallops.  ABDOMEN: Soft, non-tender, non-distended. Bowel sounds present. No organomegaly or mass.  EXTREMITIES: No pedal edema, cyanosis, or clubbing.  NEUROLOGIC: Cranial nerves II through XII are intact. Muscle strength 5/5 in all extremities. Sensation intact. Gait not checked.  PSYCHIATRIC: The patient is alert and oriented x 3.  SKIN: No obvious rash, lesion, or ulcer.   DATA REVIEW:   CBC  Recent Labs  Lab  02/16/17 0424  WBC 5.9  HGB 10.7*  HCT 31.9*  PLT 151    Chemistries  Recent Labs  Lab 02/15/17 0853 02/16/17 0424  NA 137 136  K 6.0* 5.5*  CL 98* 98*  CO2 26 28  GLUCOSE 99 115*  BUN 95* 61*  CREATININE 11.72* 8.91*  CALCIUM 9.2 8.9  AST 19  --   ALT 11*  --   ALKPHOS 38  --   BILITOT 0.9  --     Microbiology Results   Recent Results (from the past 240 hour(s))  MRSA PCR Screening     Status: None   Collection Time: 02/15/17  6:12 PM  Result Value Ref Range Status   MRSA by PCR NEGATIVE NEGATIVE Final    Comment:        The GeneXpert MRSA Assay (FDA approved for NASAL specimens only), is one component of a comprehensive MRSA colonization surveillance program. It is not intended to diagnose MRSA infection nor to guide or monitor treatment for MRSA infections.     RADIOLOGY:  No results found.   Management plans discussed with the patient, family and they are in agreement.  CODE STATUS:     Code Status Orders  (From admission, onward)        Start     Ordered   02/15/17 1243  Full code  Continuous     02/15/17 1243    Code Status History    Date Active Date Inactive Code Status Order ID Comments User Context   10/23/2015 08:28 10/23/2015 09:08 Full Code [id number]  Saundra Shelling, MD Inpatient   09/03/2015 01:30 09/03/2015 16:19 Full Code [id number]  Lance Coon, MD Inpatient   07/02/2013 13:27 07/14/2013 15:40 Full Code [id number]  Nani Skillern, PA-C Inpatient   06/29/2013 21:53 07/02/2013 13:27 Full Code [id number]  Larey Dresser, MD Inpatient      TOTAL TIME TAKING CARE OF THIS PATIENT: *40* minutes.    [name] M.D on 02/16/2017 at 12:25 PM  Between 7am to 6pm - Pager - [phone] After 6pm go to [email or web address] - password [email or web address]  Office  [phone]  CC: Primary care physician; [name], MD

## 2017-02-16 NOTE — Progress Notes (Signed)
Pre HD  

## 2017-02-16 NOTE — Progress Notes (Signed)
HD started. 

## 2017-02-16 NOTE — Progress Notes (Signed)
MD order to discharge home. Patient stated that she would take any medications that were missed when she gets home. Her son is present to transport her home. Discharge instructions and instructions for follow up appointment were reviewed with patient. Also discussed the change of Lantus per discharge instructions.

## 2017-02-16 NOTE — Progress Notes (Signed)
[facility]  ROUNDING NOTE   Subjective:  Patient seen at bedside. Hyperkalemia has improved and serum potassium down to 5.5.    Objective:  Vital signs in last 24 hours:  Temp:  [97.4 F (36.3 C)-98.6 F (37 C)] 98.4 F (36.9 C) (12/15 1017) Pulse Rate:  [62-77] 67 (12/15 1115) Resp:  [11-20] 15 (12/15 1115) BP: (92-195)/(45-100) 144/61 (12/15 1115) SpO2:  [96 %-100 %] 96 % (12/15 1115) Weight:  [93 kg (205 lb 0.4 oz)-93.6 kg (206 lb 6.4 oz)] 93 kg (205 lb 0.4 oz) (12/15 1017)  Weight change:  Filed Weights   02/15/17 0844 02/15/17 1247 02/16/17 1017  Weight: 91.2 kg (201 lb) 93.6 kg (206 lb 6.4 oz) 93 kg (205 lb 0.4 oz)    Intake/Output: I/O last 3 completed shifts: In: 100 [IV Piggyback:100] Out: 1300 [Urine:300; Other:1000]   Intake/Output this shift:  Total I/O In: 240 [P.O.:240] Out: -   Physical Exam: General: No acute distress  Head: Normocephalic, atraumatic. Moist oral mucosal membranes  Eyes: Anicteric  Neck: Supple, trachea midline  Lungs:  Clear to auscultation, normal effort  Heart: S1S2 no rubs  Abdomen:  Soft, nontender, bowel sounds present  Extremities: Trace peripheral edema.  Neurologic: Awake, alert, following commands  Skin: No lesions  Access: RUE AVF    Basic Metabolic Panel: Recent Labs  Lab 02/15/17 0853 02/16/17 0424  NA 137 136  K 6.0* 5.5*  CL 98* 98*  CO2 26 28  GLUCOSE 99 115*  BUN 95* 61*  CREATININE 11.72* 8.91*  CALCIUM 9.2 8.9    Liver Function Tests: Recent Labs  Lab 02/15/17 0853  AST 19  ALT 11*  ALKPHOS 38  BILITOT 0.9  PROT 7.5  ALBUMIN 3.7   No results for input(s): LIPASE, AMYLASE in the last 168 hours. No results for input(s): AMMONIA in the last 168 hours.  CBC: Recent Labs  Lab 02/15/17 0853 02/16/17 0424  WBC 7.6 5.9  NEUTROABS 5.9  --   HGB 11.5* 10.7*  HCT 34.2* 31.9*  MCV 95.5 95.0  PLT 171 151    Cardiac Enzymes: No results for input(s): CKTOTAL, CKMB,  CKMBINDEX, TROPONINI in the last 168 hours.  BNP: Invalid input(s): POCBNP  CBG: Recent Labs  Lab 02/15/17 2202  GLUCAP 136*    Microbiology: Results for orders placed or performed during the hospital encounter of 02/15/17  MRSA PCR Screening     Status: None   Collection Time: 02/15/17  6:12 PM  Result Value Ref Range Status   MRSA by PCR NEGATIVE NEGATIVE Final    Comment:        The GeneXpert MRSA Assay (FDA approved for NASAL specimens only), is one component of a comprehensive MRSA colonization surveillance program. It is not intended to diagnose MRSA infection nor to guide or monitor treatment for MRSA infections.     Coagulation Studies: No results for input(s): LABPROT, INR in the last 72 hours.  Urinalysis: No results for input(s): COLORURINE, LABSPEC, PHURINE, GLUCOSEU, HGBUR, BILIRUBINUR, KETONESUR, PROTEINUR, UROBILINOGEN, NITRITE, LEUKOCYTESUR in the last 72 hours.  Invalid input(s): APPERANCEUR    Imaging: No results found.   Medications:   . sodium chloride     . aspirin EC  81 mg Oral Daily  . atorvastatin  80 mg Oral Daily  . furosemide  20 mg Oral QODAY  . gabapentin  300 mg Oral BID  . heparin  5,000 Units Subcutaneous Q8H  . insulin glargine  14 Units Subcutaneous  QHS  . levothyroxine  75 mcg Oral Daily  . midodrine  5 mg Oral BID WC  . pantoprazole  40 mg Oral Daily  . patiromer  8.4 g Oral Daily  . sodium chloride flush  3 mL Intravenous Q12H  . traZODone  50 mg Oral QHS   sodium chloride, acetaminophen **OR** acetaminophen, diphenoxylate-atropine, metoCLOPramide, nitroGLYCERIN, senna-docusate, sodium chloride flush  Assessment/ Plan:  61 y.o. female with past medical history of diabetes mellitus type 2, hypertension, diastolic heart failure, ESRD on HD since 10/17, GERD, hyperlipidemia, peripheral neuropathy, coronary artery disease who presents now with hyperkalemia after missing dialysis.  1.  Hyperkalemia.  2.  Anemia of  CKD.  3.  Secondary hyperparathyroidism.  Plan:  Serum potassium currently down to 5.5.  She remains on V[place] 8.4 g p.o. daily.  In addition patient will receive dialysis which will help bring down her serum potassium today.  Hemoglobin currently 10.7.  The patient will continue on erythropoietin stimulant agents as an outpatient.  Check serum phosphorus today.   LOS: 0 Maritza Hosterman 12/15/201811:23 AM

## 2017-02-16 NOTE — Clinical Social Work Note (Signed)
CSW attempted to see the patient multiple times to discuss resources for potential verbal altercations with her spouse. The patient was not in the room due to procedures each attempt. The CSW alerted the attending RN of resources for the patient to be included in her discharge information and that the CSW is available if the patient has any questions. CSW will continue to follow.  Santiago Bumpers, MSW, Latanya Presser [phone]

## 2017-02-16 NOTE — Discharge Instructions (Signed)
Resume your hemodialysis as outpatient per your routine schedule

## 2017-02-16 NOTE — Progress Notes (Signed)
Post HD  

## 2017-02-18 DIAGNOSIS — Z23 Encounter for immunization: Secondary | ICD-10-CM | POA: Diagnosis not present

## 2017-02-18 DIAGNOSIS — D509 Iron deficiency anemia, unspecified: Secondary | ICD-10-CM | POA: Diagnosis not present

## 2017-02-18 DIAGNOSIS — N2581 Secondary hyperparathyroidism of renal origin: Secondary | ICD-10-CM | POA: Diagnosis not present

## 2017-02-18 DIAGNOSIS — E1122 Type 2 diabetes mellitus with diabetic chronic kidney disease: Secondary | ICD-10-CM | POA: Diagnosis not present

## 2017-02-18 DIAGNOSIS — N186 End stage renal disease: Secondary | ICD-10-CM | POA: Diagnosis not present

## 2017-02-18 DIAGNOSIS — D631 Anemia in chronic kidney disease: Secondary | ICD-10-CM | POA: Diagnosis not present

## 2017-02-18 LAB — HIV ANTIBODY (ROUTINE TESTING W REFLEX): HIV Screen 4th Generation wRfx: NONREACTIVE

## 2017-02-18 LAB — HEMOGLOBIN A1C: Hemoglobin A1C: 7.1

## 2017-02-20 DIAGNOSIS — N186 End stage renal disease: Secondary | ICD-10-CM | POA: Diagnosis not present

## 2017-02-20 DIAGNOSIS — N2581 Secondary hyperparathyroidism of renal origin: Secondary | ICD-10-CM | POA: Diagnosis not present

## 2017-02-20 DIAGNOSIS — D631 Anemia in chronic kidney disease: Secondary | ICD-10-CM | POA: Diagnosis not present

## 2017-02-20 DIAGNOSIS — D509 Iron deficiency anemia, unspecified: Secondary | ICD-10-CM | POA: Diagnosis not present

## 2017-02-20 DIAGNOSIS — E1122 Type 2 diabetes mellitus with diabetic chronic kidney disease: Secondary | ICD-10-CM | POA: Diagnosis not present

## 2017-02-20 DIAGNOSIS — Z23 Encounter for immunization: Secondary | ICD-10-CM | POA: Diagnosis not present

## 2017-02-22 ENCOUNTER — Encounter: Payer: Self-pay | Admitting: Emergency Medicine

## 2017-02-22 ENCOUNTER — Encounter
Admission: EM | Disposition: A | Discharge status: Home / Self Care | Payer: Self-pay | Source: Home / Self Care | Attending: Emergency Medicine

## 2017-02-22 ENCOUNTER — Emergency Department
Admission: EM | Admit: 2017-02-22 | Discharge: 2017-02-22 | Disposition: A | Discharge status: Home / Self Care | Payer: Medicare Other | Attending: Emergency Medicine | Admitting: Emergency Medicine

## 2017-02-22 DIAGNOSIS — E1140 Type 2 diabetes mellitus with diabetic neuropathy, unspecified: Secondary | ICD-10-CM | POA: Diagnosis not present

## 2017-02-22 DIAGNOSIS — Z79899 Other long term (current) drug therapy: Secondary | ICD-10-CM | POA: Diagnosis not present

## 2017-02-22 DIAGNOSIS — I5032 Chronic diastolic (congestive) heart failure: Secondary | ICD-10-CM | POA: Insufficient documentation

## 2017-02-22 DIAGNOSIS — N186 End stage renal disease: Secondary | ICD-10-CM | POA: Insufficient documentation

## 2017-02-22 DIAGNOSIS — I2510 Atherosclerotic heart disease of native coronary artery without angina pectoris: Secondary | ICD-10-CM | POA: Insufficient documentation

## 2017-02-22 DIAGNOSIS — Z794 Long term (current) use of insulin: Secondary | ICD-10-CM | POA: Insufficient documentation

## 2017-02-22 DIAGNOSIS — Z7982 Long term (current) use of aspirin: Secondary | ICD-10-CM | POA: Diagnosis not present

## 2017-02-22 DIAGNOSIS — E039 Hypothyroidism, unspecified: Secondary | ICD-10-CM | POA: Diagnosis not present

## 2017-02-22 DIAGNOSIS — Z992 Dependence on renal dialysis: Secondary | ICD-10-CM | POA: Diagnosis not present

## 2017-02-22 DIAGNOSIS — I132 Hypertensive heart and chronic kidney disease with heart failure and with stage 5 chronic kidney disease, or end stage renal disease: Secondary | ICD-10-CM | POA: Diagnosis not present

## 2017-02-22 DIAGNOSIS — I252 Old myocardial infarction: Secondary | ICD-10-CM | POA: Diagnosis not present

## 2017-02-22 DIAGNOSIS — Y829 Unspecified medical devices associated with adverse incidents: Secondary | ICD-10-CM | POA: Diagnosis not present

## 2017-02-22 DIAGNOSIS — E785 Hyperlipidemia, unspecified: Secondary | ICD-10-CM | POA: Diagnosis not present

## 2017-02-22 DIAGNOSIS — T82591A Other mechanical complication of surgically created arteriovenous shunt, initial encounter: Secondary | ICD-10-CM | POA: Insufficient documentation

## 2017-02-22 DIAGNOSIS — I10 Essential (primary) hypertension: Secondary | ICD-10-CM | POA: Diagnosis not present

## 2017-02-22 DIAGNOSIS — T82868A Thrombosis of vascular prosthetic devices, implants and grafts, initial encounter: Secondary | ICD-10-CM

## 2017-02-22 DIAGNOSIS — E119 Type 2 diabetes mellitus without complications: Secondary | ICD-10-CM | POA: Diagnosis not present

## 2017-02-22 HISTORY — PX: A/V SHUNT INTERVENTION: CATH118220

## 2017-02-22 LAB — COMPREHENSIVE METABOLIC PANEL
ALT: 19 U/L (ref 14–54)
AST: 31 U/L (ref 15–41)
Albumin: 3.9 g/dL (ref 3.5–5.0)
Alkaline Phosphatase: 40 U/L (ref 38–126)
Anion gap: 15 (ref 5–15)
BUN: 36 mg/dL — ABNORMAL HIGH (ref 6–20)
CO2: 26 mmol/L (ref 22–32)
Calcium: 9 mg/dL (ref 8.9–10.3)
Chloride: 94 mmol/L — ABNORMAL LOW (ref 101–111)
Creatinine, Ser: 7.37 mg/dL — ABNORMAL HIGH (ref 0.44–1.00)
GFR calc Af Amer: 6 mL/min — ABNORMAL LOW (ref >60)
GFR calc non Af Amer: 5 mL/min — ABNORMAL LOW (ref >60)
Glucose, Bld: 242 mg/dL — ABNORMAL HIGH (ref 65–99)
Potassium: 3.9 mmol/L (ref 3.5–5.1)
Sodium: 135 mmol/L (ref 135–145)
Total Bilirubin: 0.9 mg/dL (ref 0.3–1.2)
Total Protein: 7.5 g/dL (ref 6.5–8.1)

## 2017-02-22 LAB — CBC WITH DIFFERENTIAL/PLATELET
Band Neutrophils: 0 %
Basophils Absolute: 0.1 10*3/uL (ref 0–0.1)
Basophils Relative: 1 %
Blasts: 0 %
Eosinophils Absolute: 0.2 10*3/uL (ref 0–0.7)
Eosinophils Relative: 2 %
HCT: 33.8 % — ABNORMAL LOW (ref 35.0–47.0)
Hemoglobin: 11.5 g/dL — ABNORMAL LOW (ref 12.0–16.0)
Lymphocytes Relative: 12 %
Lymphs Abs: 1 10*3/uL (ref 1.0–3.6)
MCH: 32.1 pg (ref 26.0–34.0)
MCHC: 34.1 g/dL (ref 32.0–36.0)
MCV: 94.3 fL (ref 80.0–100.0)
Metamyelocytes Relative: 0 %
Monocytes Absolute: 0.5 10*3/uL (ref 0.2–0.9)
Monocytes Relative: 6 %
Myelocytes: 0 %
Neutro Abs: 6.2 10*3/uL (ref 1.4–6.5)
Neutrophils Relative %: 79 %
Other: 0 %
Platelets: 142 10*3/uL — ABNORMAL LOW (ref 150–440)
Promyelocytes Absolute: 0 %
RBC: 3.58 MIL/uL — ABNORMAL LOW (ref 3.80–5.20)
RDW: 15.9 % — ABNORMAL HIGH (ref 11.5–14.5)
Smear Review: ADEQUATE
WBC: 8 10*3/uL (ref 3.6–11.0)
nRBC: 0 /100 WBC

## 2017-02-22 LAB — PHOSPHORUS: Phosphorus: 2.5 mg/dL (ref 2.5–4.6)

## 2017-02-22 SURGERY — PERIPHERAL VASCULAR THROMBECTOMY
Anesthesia: Moderate Sedation

## 2017-02-22 SURGERY — A/V SHUNT INTERVENTION
Anesthesia: Moderate Sedation

## 2017-02-22 MED ORDER — MIDAZOLAM HCL 2 MG/2ML IJ SOLN
INTRAMUSCULAR | Status: DC | PRN
  Administered 2017-02-22: 2 mg via INTRAVENOUS
  Administered 2017-02-22: 1 mg via INTRAVENOUS

## 2017-02-22 MED ORDER — HEPARIN SODIUM (PORCINE) 1000 UNIT/ML IJ SOLN
INTRAMUSCULAR | Status: AC
  Filled 2017-02-22: qty 1

## 2017-02-22 MED ORDER — HEPARIN SODIUM (PORCINE) 1000 UNIT/ML DIALYSIS: 1000.0000 [IU] | INTRAMUSCULAR | Status: DC | PRN

## 2017-02-22 MED ORDER — LABETALOL HCL 5 MG/ML IV SOLN
INTRAVENOUS | Status: DC | PRN
  Administered 2017-02-22: 20 mg via INTRAVENOUS

## 2017-02-22 MED ORDER — MIDAZOLAM HCL 5 MG/5ML IJ SOLN
INTRAMUSCULAR | Status: AC
  Filled 2017-02-22: qty 5

## 2017-02-22 MED ORDER — HYDRALAZINE HCL 50 MG PO TABS
ORAL_TABLET | ORAL | Status: AC
  Filled 2017-02-22: qty 1

## 2017-02-22 MED ORDER — ALTEPLASE 2 MG IJ SOLR: 2.0000 mg | Freq: Once | INTRAMUSCULAR | Status: DC | PRN

## 2017-02-22 MED ORDER — SODIUM CHLORIDE 0.9 % IV SOLN
INTRAVENOUS | Status: DC
  Administered 2017-02-22: 17:00:00 via INTRAVENOUS

## 2017-02-22 MED ORDER — FENTANYL CITRATE (PF) 100 MCG/2ML IJ SOLN
INTRAMUSCULAR | Status: DC | PRN
  Administered 2017-02-22: 25 ug via INTRAVENOUS
  Administered 2017-02-22: 50 ug via INTRAVENOUS

## 2017-02-22 MED ORDER — LIDOCAINE HCL (PF) 1 % IJ SOLN: 5.0000 mL | INTRAMUSCULAR | Status: DC | PRN

## 2017-02-22 MED ORDER — CEFAZOLIN SODIUM-DEXTROSE 1-4 GM/50ML-% IV SOLN
1.0000 g | Freq: Once | INTRAVENOUS | Status: AC
  Administered 2017-02-22: 1 g via INTRAVENOUS

## 2017-02-22 MED ORDER — HEPARIN SODIUM (PORCINE) 1000 UNIT/ML IJ SOLN
INTRAMUSCULAR | Status: DC | PRN
  Administered 2017-02-22: 4000 [IU] via INTRAVENOUS

## 2017-02-22 MED ORDER — IOPAMIDOL (ISOVUE-300) INJECTION 61%
INTRAVENOUS | Status: DC | PRN
  Administered 2017-02-22: 45 mL via INTRAVENOUS

## 2017-02-22 MED ORDER — FENTANYL CITRATE (PF) 100 MCG/2ML IJ SOLN
INTRAMUSCULAR | Status: AC
  Filled 2017-02-22: qty 2

## 2017-02-22 MED ORDER — HYDRALAZINE HCL 50 MG PO TABS
50.0000 mg | ORAL_TABLET | Freq: Once | ORAL | Status: AC
  Administered 2017-02-22: 50 mg via ORAL

## 2017-02-22 MED ORDER — LIDOCAINE-EPINEPHRINE (PF) 1 %-1:200000 IJ SOLN
INTRAMUSCULAR | Status: AC
  Filled 2017-02-22: qty 30

## 2017-02-22 MED ORDER — ALTEPLASE 2 MG IJ SOLR
INTRAMUSCULAR | Status: AC
  Filled 2017-02-22: qty 4

## 2017-02-22 MED ORDER — LABETALOL HCL 5 MG/ML IV SOLN
INTRAVENOUS | Status: AC
  Filled 2017-02-22: qty 4

## 2017-02-22 MED ORDER — LIDOCAINE-PRILOCAINE 2.5-2.5 % EX CREA: 1.0000 "application " | TOPICAL_CREAM | CUTANEOUS | Status: DC | PRN

## 2017-02-22 MED ORDER — SODIUM CHLORIDE 0.9 % IV SOLN: 100.0000 mL | INTRAVENOUS | Status: DC | PRN

## 2017-02-22 MED ORDER — PENTAFLUOROPROP-TETRAFLUOROETH EX AERO: 1.0000 "application " | INHALATION_SPRAY | CUTANEOUS | Status: DC | PRN

## 2017-02-22 MED ORDER — ALTEPLASE 2 MG IJ SOLR
INTRAMUSCULAR | Status: DC | PRN
  Administered 2017-02-22: 4 mg

## 2017-02-22 SURGICAL SUPPLY — 19 items
BALLN DORADO 7X60X80 (BALLOONS) ×2
BALLOON DORADO 7X60X80 (BALLOONS) ×1 IMPLANT
CANNULA 5F STIFF (CANNULA) ×2 IMPLANT
CATH BEACON 5 .035 40 KMP TP (CATHETERS) ×1 IMPLANT
CATH BEACON 5 .038 40 KMP TP (CATHETERS) ×1
CATH EMBOLECTOMY 5FR (BALLOONS) ×2 IMPLANT
COVER PROBE U/S 5X48 (MISCELLANEOUS) ×2 IMPLANT
DEVICE PRESTO INFLATION (MISCELLANEOUS) ×4 IMPLANT
PACK ANGIOGRAPHY (CUSTOM PROCEDURE TRAY) ×2 IMPLANT
SET AVX THROMB ULT (MISCELLANEOUS) ×2 IMPLANT
SHEATH BRITE TIP 6FRX5.5 (SHEATH) ×4 IMPLANT
SHEATH BRITE TIP 7FRX5.5 (SHEATH) ×4 IMPLANT
STENT VIABAHN 7X50X120 (Permanent Stent) ×1 IMPLANT
STENT VIABAHN 7X5X120 7FR (Permanent Stent) ×1 IMPLANT
SUT MNCRL 4-0 (SUTURE) ×1
SUT MNCRL 4-0 27XMFL (SUTURE) ×1
SUTURE MNCRL 4-0 27XMF (SUTURE) ×1 IMPLANT
WIRE G 018X200 V18 (WIRE) ×2 IMPLANT
WIRE MAGIC TOR.035 180C (WIRE) ×4 IMPLANT

## 2017-02-22 NOTE — ED Triage Notes (Signed)
Patient from [facility] with c/o clotted vascular access. This RN spoke with Jamie Arias from [facility] whom stated that her nephrologist contacted Dr. Arta Silence whom contacted Dr. Mahala Menghini who will evaluate and place a permacath in this ED

## 2017-02-22 NOTE — Progress Notes (Signed)
Pt's fistula noted to be bleeding and BP elevated.  Pressure held by this nurse and VORB for 50mg  hydralazine PO obtained from Dr. Lucky Cowboy.  Will continue to monitor.

## 2017-02-22 NOTE — Discharge Instructions (Signed)

## 2017-02-22 NOTE — Progress Notes (Signed)
Discharge and follow up instructions reviewed with pt who verbalizes understanding.  Dr. Lucky Cowboy at bedside directly after procedure to discuss findings of procedure.  Pt's husband called, he states to call when pt is ready for discharge and he will pick up at [facility] mall.

## 2017-02-22 NOTE — ED Notes (Signed)
Pt sent from dialysis due to fistula clot, pt in NAD denies any pain at this time

## 2017-02-22 NOTE — Op Note (Signed)
[facility]    OPERATIVE NOTE   PROCEDURE: 1.  Right brachial artery to axillary vein arteriovenous graft cannulation under ultrasound guidance in both a retrograde and then antegrade fashion crossing 2.  Right arm shuntogram and central venogram 3.  Catheter directed thrombolysis with 4 mg of TPA delivered with the AngioJet AVX catheter 4.  Fogarty embolectomy for residual arterial plug 5.  Percutaneous transluminal angioplasty of arterial anastomosis with 7 mm diameter by 6 cm length high-pressure angioplasty balloon 6.  Percutaneous transluminal angioplasty of the mid and distal graft and the venous anastomosis and axillary vein with multiple inflations with the 7 mm diameter by 6 cm pressure angioplasty balloon  PRE-OPERATIVE DIAGNOSIS: 1. ESRD 2.  Thrombosed right brachial artery to axillary vein arteriovenous graft  POST-OPERATIVE DIAGNOSIS: same as above   SURGEON: Leotis Pain, MD  ANESTHESIA: local with Moderate Conscious Sedation for approximately 35 minutes using 3 mg of Versed and 75 Mcg of Fentanyl  ESTIMATED BLOOD LOSS: 20 cc  FINDING(S): 1. Thrombosed graft, opened with intervention  SPECIMEN(S):  None  CONTRAST: 45 cc  FLUORO TIME:  2.9 minutes  INDICATIONS: Patient is a 61 y.o.female who presents with a thrombosed right brachial artery to axillary vein arteriovenous graft.  The patient is scheduled for an attempted declot and shuntogram.  The patient is aware the risks include but are not limited to: bleeding, infection, thrombosis of the cannulated access, and possible anaphylactic reaction to the contrast.  The patient is aware of the risks of the procedure and elects to proceed forward.  DESCRIPTION: After full informed written consent was obtained, the patient was brought back to the angiography suite and placed supine upon the angiography table.  The patient was connected to monitoring equipment. Moderate conscious sedation was  administered with a face to face encounter with the patient throughout the procedure with my supervision of the RN administering medicines and monitoring the patient's vital signs, pulse oximetry, telemetry and mental status throughout from the start of the procedure until the patient was taken to the recovery room. The right arm was prepped and draped in the standard fashion for a percutaneous access intervention.  Under ultrasound guidance, the right brachial artery to axillary vein arteriovenous graft was cannulated with a micropuncture needle under direct ultrasound guidance due to the pulseless nature of the graft in both an antegrade and a retrograde fashion crossing, and permanent images were performed.  The microwire was advanced and the needle was exchanged for the a microsheath.  I then upsized to a 6 Fr Sheath and imaging was performed.  Hand injections were completed to image the access including the central venous system. This demonstrated no flow within the AV graft.  Based on the images, this patient will need extensive treatment to salvage the graft. I then gave the patient 4000 units of intravenous heparin.  I then placed a Magic torque wire into the brachial artery from the retrograde sheath and into the axillary vein from the antegrade sheath. 4 mg of TPA were deployed throughout the entirety of the graft and into the axillary vein. This was allowed to dwell. An attempt to clear the arterial plug was done with 3 passes of the Fogarty embolectomy balloon. Flow-limiting arterial plug remained, and I elected to treat this lesion with a 7 mm diameter by 6 cm pressure angioplasty balloon inflated to 12 atm for 1 minute. This resulted in resolution of the arterial plug, and clearance of the arterial side of  the graft, but extravasation was now seen about 2-3 cm beyond the anastomosis within the graft.  I exchanged for a 7 French sheath in the retrograde fashion and a 0.018 wire cover this area with a  7 mm diameter by 5 cm length Viabahn stent taking care not to infringe upon the antegrade sheath that was still beyond the stent.  This was postdilated with a 7 mm balloon with excellent angiographic completion result and no significant residual stenosis. The arterial outflow was seen to be intact through the radial and ulnar arteries as well on these images. The retrograde sheath was removed. I then turned my attention to the thrombus and stenosis in the distal graft and the axillary vein. I then elected to treat this with multiple inflations with the 7 mm diameter by 6 cm length high-pressure angioplasty balloon.  A total of 3 inflations were required to treat the mid, distal, and venous anastomotic portion of the graft and into the axillary vein.  Each inflation was from 12-20 atm for 1 minute completion angiogram following these procedures showed less than 20% residual stenosis and I elected to terminate the procedure.    Based on the completion imaging, no further intervention is necessary.  The wire and balloon were removed from the sheath.  A 4-0 Monocryl purse-string suture was sewn around the sheath.  The sheath was removed while tying down the suture.  A sterile bandage was applied to the puncture site.  COMPLICATIONS: None  CONDITION: Stable   Leotis Pain 02/22/2017 6:19 PM   This note was created with Dragon Medical transcription system. Any errors in dictation are purely unintentional.

## 2017-02-22 NOTE — ED Notes (Signed)
Pt ambulatory to wheel chair for ED discharge. Christine, RN picked pt up from ED34 to bring her to specials for declotting of right upper arm fistula. Pt verbalized understanding of ED discharge instructions. Skin warm and dry. A&O x4.

## 2017-02-22 NOTE — H&P (Signed)
[facility] Admission History & Physical  MRN : [id number]  Jamie Arias is a 61 y.o. ([birth year]) female who presents with chief complaint of  Chief Complaint  Patient presents with  . Vascular Access Problem  .  History of Present Illness: I am asked to evaluate the patient by the dialysis center. The patient was sent here because they were unable to cannulate the access this morning. Furthermore the Center states there is no thrill or bruit. The patient states this is the first dialysis run to be missed. This problem is acute in onset and has been present for less than 2 days. The patient is unaware of any other change.  Patient denies pain or tenderness overlying the access.  There is no pain with dialysis.  The patient denies hand pain or finger pain consistent with steal syndrome.   There have many past interventions or declots of this access.  The patient is not chronically hypotensive on dialysis.  No current facility-administered medications for this encounter.    Current Outpatient Medications  Medication Sig Dispense Refill  . acetaminophen (TYLENOL) 325 MG tablet Take 650 mg by mouth daily as needed for moderate pain or headache.     Marland Kitchen aspirin EC 81 MG tablet Take 81 mg by mouth daily.    Marland Kitchen atorvastatin (LIPITOR) 80 MG tablet Take 1 tablet (80 mg total) by mouth daily. 90 tablet 3  . furosemide (LASIX) 20 MG tablet Take 20 mg by mouth every other day. Non-dialysis days    . gabapentin (NEURONTIN) 300 MG capsule TAKE 1 CAPSULE(300 MG) BY MOUTH THREE TIMES DAILY 90 capsule 0  . Insulin Glargine (LANTUS SOLOSTAR) 100 UNIT/ML Solostar Pen Inject 14 Units into the skin daily at 10 pm. 15 mL 1  . levothyroxine (SYNTHROID, LEVOTHROID) 75 MCG tablet Take 1 tablet (75 mcg total) by mouth daily. 90 tablet 1  . lidocaine-prilocaine (EMLA) cream Apply 1 application topically as needed (port access).    . midodrine (PROAMATINE) 5 MG tablet Take 1 tablet (5 mg  total) by mouth 2 (two) times daily with a meal. (Patient taking differently: Take 5-15 mg by mouth as directed. Take 29m by mouth twice daily with an additional 118mtablet 30 minutes before dialysis (Monday, Wednesday and Friday)) 180 tablet 3  . omeprazole (PRILOSEC) 20 MG capsule Take 40 mg by mouth 2 (two) times daily.    . traZODone (DESYREL) 50 MG tablet TAKE 1 TABLET BY MOUTH EVERY NIGHT AT BEDTIME 30 tablet 2  . ACCU-CHEK FASTCLIX LANCETS MISC Check blood glucose twice daily, diagnosis code E11.9 204 each 3  . blood glucose meter kit and supplies KIT Dispense based on patient and insurance preference. Use up to twice daily. (FOR ICD-10 E11.9) 1 each 0  . glucose blood (CONTOUR NEXT TEST) test strip Please check blood glucose twice daily, diagnosis code E11.9 100 each 12  . Insulin Pen Needle (GLOBAL EASE INJECT PEN NEEDLES) 31G X 5 MM MISC Inject 0.1 mLs (10 Units total) into the skin at bedtime 90 each 2  . metoCLOPramide (REGLAN) 5 MG tablet Take 1 tablet (5 mg total) by mouth every 8 (eight) hours as needed for nausea. (Patient not taking: Reported on 02/15/2017) 30 tablet 1  . nitroGLYCERIN (NITROSTAT) 0.4 MG SL tablet DISSOLVE ONE TABLET UNDER THE TONGUE EVERY 5 MINUTES AS NEEDED FOR CHEST PAIN.  DO NOT EXCEED A TOTAL OF 3 DOSES IN 15 MINUTES 25 tablet 0    Past  Medical History:  Diagnosis Date  . Anemia   . Chronic diastolic CHF (congestive heart failure) ([place])    a. Due to ischemic cardiomyopathy. EF as low as 35%, improved to normal s/p CABG; b. echo 07/06/13: EF 55-60%, no RWMAs, mod TR, trivial pericardial effusion not c/w tamponade physiology;  c. 10/2015 Echo: EF 65%, Gr1 DD, triv AI, mild MR, mildly dil LA, mod TR, PASP 32mHg.  .Marland KitchenCoronary artery disease    a. NSTEMI 06/2013; b.cath: severe three-vessel CAD w/ EF 30% & mild-mod MR; c. s/p 3 vessel CABG 07/02/13 (LIMA-LAD, SVG-OM, and SVG-RPDA);  d. 10/2015 MV: no ischemia/infarct.  . Diabetes mellitus without complication ([place]   .  Diabetic neuropathy ([place]   . D[facility] patient ([facility]    Tues, Thurs, Sat  . ESRD ([place] renal disease) ([place]    a. 12/2015 initiated TTS dialysis.  .Marland KitchenGERD (gastroesophageal reflux disease)   . Hyperlipidemia   . Hypertension   . Hypothyroidism   . Myocardial infarction ([place] 2015  . Neuropathy   . Pleural effusion 2015  . Pulmonary hypertension ([place]   . Wears dentures    full lower    Past Surgical History:  Procedure Laterality Date  . A/V FISTULAGRAM Right 12/17/2016   Procedure: A/V Fistulagram;  Surgeon: DAlgernon Huxley MD;  [facility];  Service: Cardiovascular;  Laterality: Right;  . A/V FISTULAGRAM Right 01/07/2017   Procedure: A/V Fistulagram;  Surgeon: DAlgernon Huxley MD;  [place];  Service: Cardiovascular;  Laterality: Right;  . AV FISTULA PLACEMENT Right 02/03/2016   Procedure: INSERTION OF ARTERIOVENOUS (AV) GORE-TEX GRAFT ARM ( BRACH / AXILLARY );  Surgeon: GKatha Cabal MD;  Location: [facility];  Service: Vascular;  Laterality: Right;  . CARDIAC CATHETERIZATION    . CATARACT EXTRACTION Bilateral   . CHOLECYSTECTOMY N/A 12/09/2014   Procedure: LAPAROSCOPIC CHOLECYSTECTOMY;  Surgeon: CMarlyce [name] MD;  Location: [facility];  Service: General;  Laterality: N/A;  . CORONARY ARTERY BYPASS GRAFT N/A 07/02/2013   Procedure: CORONARY ARTERY BYPASS GRAFTING (CABG);  Surgeon: PIvin [name] MD;  [place]  Service: Open Heart Surgery;  Laterality: N/A;  CABG x three, using left internal mammary artery and right leg greater saphenous vein harvested endoscopically  . ESOPHAGOGASTRODUODENOSCOPY (EGD) WITH PROPOFOL N/A 11/24/2015   Procedure: ESOPHAGOGASTRODUODENOSCOPY (EGD) WITH PROPOFOL;  Surgeon: DLucilla [name] MD;  [place]  Service: Endoscopy;  Laterality: N/A;  Diabetic - insulin  . EYE SURGERY Bilateral    Cataract Extraction with IOL  . INTRAOPERATIVE TRANSESOPHAGEAL ECHOCARDIOGRAM N/A 07/02/2013    Procedure: INTRAOPERATIVE TRANSESOPHAGEAL ECHOCARDIOGRAM;  Surgeon: PIvin [name] MD;  [place]  Service: Open Heart Surgery;  Laterality: N/A;  . PERIPHERAL VASCULAR CATHETERIZATION Right 12/06/2015   Procedure: Dialysis/Perma Catheter Insertion;  Surgeon: GKatha Cabal MD;  [facility];  Service: Cardiovascular;  Laterality: Right;  . PERIPHERAL VASCULAR THROMBECTOMY Right 09/28/2016   Procedure: Peripheral Vascular Thrombectomy;  Surgeon: SKatha Cabal MD;  [place];  Service: Cardiovascular;  Laterality: Right;  . PORTA CATH REMOVAL N/A 06/01/2016   Procedure: PGlori LuisCath Removal;  Surgeon: GKatha Cabal MD;  [place] [facility];  Service: Cardiovascular;  Laterality: N/A;  . THORACENTESIS Left 2015    Social History Social History   Tobacco Use  . Smoking status: Never Smoker  . Smokeless tobacco: Never Used  Substance Use Topics  . Alcohol  use: No    Alcohol/week: 0.0 oz  . Drug use: No    Family History Family History  Problem Relation Age of Onset  . COPD Mother   . Cancer Mother        Lung  . Pulmonary embolism Father   . Diabetes Father   . Diabetes Paternal Grandfather   . Heart disease Maternal Grandmother   . Colon cancer Neg Hx   . Colon polyps Neg Hx   . Esophageal cancer Neg Hx   . Pancreatic cancer Neg Hx   . Liver disease Neg Hx     No family history of bleeding or clotting disorders, autoimmune disease or porphyria  Allergies  Allergen Reactions  . Nsaids Other (See Comments)    Contraindicated due to kidney disease.     REVIEW OF SYSTEMS (Negative unless checked)  Constitutional: _0 Weight loss  _1 Fever  _2 Chills Cardiac: _3 Chest pain   _4 Chest pressure   _5 Palpitations   _6 Shortness of breath when laying flat   _7 Shortness of breath at rest   _8 Shortness of breath with exertion. Vascular:  _9 Pain in legs with walking   _10 Pain in legs at rest   _11 Pain in legs when  laying flat   _12 Claudication   _13 Pain in feet when walking  _14 Pain in feet at rest  _15 Pain in feet when laying flat   _16 History of DVT   _17 Phlebitis   _18 Swelling in legs   _19 Varicose veins   _20 Non-healing ulcers Pulmonary:   _21 Uses home oxygen   _22 Productive cough   _23 Hemoptysis   _24 Wheeze  _25 COPD   _26 Asthma Neurologic:  _27 Dizziness  _28 Blackouts   _29 Seizures   _30 History of stroke   _31 History of TIA  _32 Aphasia   _33 Temporary blindness   _34 Dysphagia   _35 Weakness or numbness in arms   _36 Weakness or numbness in legs Musculoskeletal:  _37 Arthritis   _38 Joint swelling   _39 Joint pain   _40 Low back pain Hematologic:  _41 Easy bruising  _42 Easy bleeding   _43 Hypercoagulable state   _44 Anemic  _45 Hepatitis Gastrointestinal:  _46 Blood in stool   _47 Vomiting blood  _48 Gastroesophageal reflux/heartburn   _49 Difficulty swallowing. Genitourinary:  _50 Chronic kidney disease   _51 Difficult urination  _52 Frequent urination  _53 Burning with urination   _54 Blood in urine Skin:  _55 Rashes   _56 Ulcers   _57 Wounds Psychological:  _58 History of anxiety   _59  History of major depression.  Physical Examination  Vitals:   02/22/17 1220 02/22/17 1225 02/22/17 1655  BP:  (!) 160/67 (!) 198/86  Pulse:  80 73  Resp:   18  Temp:  98.7 F (37.1 C)   TempSrc:  Oral   SpO2:  95% 96%  Weight: 93 kg (205 lb)    Height: _60  (1.626 m)     Body mass index is 35.19 kg/m. Gen: WD/WN, NAD Head: [place]/AT, No temporalis wasting. Prominent temp pulse not noted. Ear/Nose/Throat: Hearing grossly intact, nares w/o erythema or drainage, oropharynx w/o Erythema/Exudate,  Eyes: Conjunctiva clear, sclera non-icteric Neck: Trachea midline.  No JVD.  Pulmonary:  Good air movement, respirations not labored, no use of accessory muscles.  Cardiac: RRR, normal S1, S2. Vascular: no thrill in access right arm Vessel Right Left  Radial Palpable Palpable               Musculoskeletal: M/S 5/5 throughout.  Extremities without ischemic changes.  No deformity or  atrophy.  Neurologic: Sensation grossly intact in extremities.  Symmetrical.  Speech is fluent. Motor exam as listed above. Psychiatric: Judgment intact, Mood &  affect appropriate for pt's clinical situation. Dermatologic: No rashes or ulcers noted.  No cellulitis or open wounds.    CBC Lab Results  Component Value Date   WBC 8.0 02/22/2017   HGB 11.5 (L) 02/22/2017   HCT 33.8 (L) 02/22/2017   MCV 94.3 02/22/2017   PLT 142 (L) 02/22/2017    BMET    Component Value Date/Time   NA 135 02/22/2017 1411   NA 140 03/30/2014 1611   K 3.9 02/22/2017 1411   K 4.3 03/30/2014 1611   CL 94 (L) 02/22/2017 1411   CL 105 03/30/2014 1611   CO2 26 02/22/2017 1411   CO2 29 03/30/2014 1611   GLUCOSE 242 (H) 02/22/2017 1411   GLUCOSE 187 (H) 03/30/2014 1611   BUN 36 (H) 02/22/2017 1411   BUN 21 (H) 03/30/2014 1611   CREATININE 7.37 (H) 02/22/2017 1411   CREATININE 3.14 (H) 09/09/2015 1534   CALCIUM 9.0 02/22/2017 1411   CALCIUM 9.0 03/30/2014 1611   GFRNONAA 5 (L) 02/22/2017 1411   GFRNONAA 36 (L) 03/30/2014 1611   GFRNONAA >60 07/23/2013 0741   GFRAA 6 (L) 02/22/2017 1411   GFRAA 44 (L) 03/30/2014 1611   GFRAA >60 07/23/2013 0741   Estimated Creatinine Clearance: 8.6 mL/min (A) (by C-G formula based on SCr of 7.37 mg/dL (H)).  COAG Lab Results  Component Value Date   INR 0.99 01/30/2016   INR 1.19 07/13/2013   INR 1.27 07/12/2013    Radiology No results found.  Assessment/Plan 1.  Complication dialysis device with thrombosis AV access:  Patient's right arm dialysis access is thrombosed. The patient will undergo thrombectomy using interventional techniques.  The risks and benefits were described to the patient.  All questions were answered.  The patient agrees to proceed with angiography and intervention. Potassium will be drawn to ensure that it is an appropriate level prior to performing thrombectomy. 2.  End-stage renal disease requiring hemodialysis:  Patient will continue  dialysis therapy without further interruption if a successful thrombectomy is not achieved then catheter will be placed. Dialysis has already been arranged since the patient missed their previous session 3.  Hypertension:  Patient will continue medical management; nephrology is following no changes in oral medications. 4.  Diabetes mellitus:  Glucose will be monitored and oral medications been held this morning once the patient has undergone the patient's procedure po intake will be reinitiated and again Accu-Cheks will be used to assess the blood glucose level and treat as needed. The patient will be restarted on the patient's usual hypoglycemic regime 5.  Coronary artery disease:  EKG will be monitored. Nitrates will be used if needed. The patient's oral cardiac medications will be continued.    Leotis Pain, MD  02/22/2017 5:05 PM

## 2017-02-22 NOTE — Progress Notes (Signed)
Bleeding has ceased at this time, pressure dressing applied, hydralazine given, will continue to monitor

## 2017-02-22 NOTE — ED Provider Notes (Addendum)
[facility] Emergency Department Provider Note       Time seen: ----------------------------------------- 2:45 PM on 02/22/2017 -----------------------------------------   I have reviewed the triage vital signs and the nursing notes.  HISTORY   Chief Complaint Vascular Access Problem    HPI Jamie Arias is a 61 y.o. female with a history of anemia, CHF, diabetes, end-stage renal disease on dialysis who presents to the ED for vascular access problem.  Patient presents from [facility] dialysis with possible thrombosed fistula.  The nephrologist contacted the vascular surgeon who recommended a PermCath placement today.  Patient denies any complaints.  Last dialysis was 2 days ago.  Past Medical History:  Diagnosis Date  . Anemia   . Chronic diastolic CHF (congestive heart failure) ([place])    a. Due to ischemic cardiomyopathy. EF as low as 35%, improved to normal s/p CABG; b. echo 07/06/13: EF 55-60%, no RWMAs, mod TR, trivial pericardial effusion not c/w tamponade physiology;  c. 10/2015 Echo: EF 65%, Gr1 DD, triv AI, mild MR, mildly dil LA, mod TR, PASP 41mmHg.  Marland Kitchen Coronary artery disease    a. NSTEMI 06/2013; b.cath: severe three-vessel CAD w/ EF 30% & mild-mod MR; c. s/p 3 vessel CABG 07/02/13 (LIMA-LAD, SVG-OM, and SVG-RPDA);  d. 10/2015 MV: no ischemia/infarct.  . Diabetes mellitus without complication ([place])   . Diabetic neuropathy ([place])   . Dialysis patient [facility])    Tues, Thurs, Sat  . ESRD (end stage renal disease) ([place])    a. 12/2015 initiated TTS dialysis.  Marland Kitchen GERD (gastroesophageal reflux disease)   . Hyperlipidemia   . Hypertension   . Hypothyroidism   . Myocardial infarction ([place]) 2015  . Neuropathy   . Pleural effusion 2015  . Pulmonary hypertension ([place])   . Wears dentures    full lower    Patient Active Problem List   Diagnosis Date Noted  . Hyperkalemia 02/15/2017  . Complication from renal dialysis device 12/01/2016  . ESRD on dialysis  ([place]) 01/19/2016  . Nausea   . GI bleed 10/25/2015  . Hypertensive heart disease 10/24/2015  . Chronic diastolic CHF (congestive heart failure) ([place]) 10/24/2015  . Unstable angina ([place]) 10/23/2015  . Orthostatic hypotension 09/02/2015  . Chronic systolic CHF (congestive heart failure) ([place]) 09/02/2015  . Depression 07/27/2015  . Nausea without vomiting 07/07/2015  . Encounter for routine gynecological examination 07/06/2015  . Routine general medical examination at a health care facility 07/06/2015  . Cough 05/25/2015  . Calculus of gallbladder with chronic cholecystitis without obstruction   . Pre-operative cardiovascular examination 11/30/2014  . Bilateral carotid bruits 11/30/2014  . Low magnesium levels 08/10/2013  . Anemia 08/10/2013  . Constipation 08/10/2013  . Postoperative atrial fibrillation ([place]) 07/30/2013  . Coronary artery disease   . CAD (coronary artery disease) 07/02/2013  . Acute systolic heart failure ([place]) 06/30/2013  . NSTEMI (non-ST elevated myocardial infarction) ([place]) 06/29/2013  . Sleep disorder 05/23/2012  . Hypothyroid 04/27/2012  . HTN (hypertension) 04/27/2012  . HLD (hyperlipidemia) 04/27/2012  . Diabetes mellitus, type 2 ([place]) 04/27/2012  . Neuropathy 04/27/2012    Past Surgical History:  Procedure Laterality Date  . A/V FISTULAGRAM Right 12/17/2016   Procedure: A/V Fistulagram;  Surgeon: Algernon Huxley, MD;  Location: [place] [facility];  Service: Cardiovascular;  Laterality: Right;  . A/V FISTULAGRAM Right 01/07/2017   Procedure: A/V Fistulagram;  Surgeon: Algernon Huxley, MD;  Location: [place] [facility];  Service: Cardiovascular;  Laterality: Right;  . AV  FISTULA PLACEMENT Right 02/03/2016   Procedure: INSERTION OF ARTERIOVENOUS (AV) GORE-TEX GRAFT ARM ( BRACH / AXILLARY );  Surgeon: Katha Cabal, MD;  Location: [facility];  Service: Vascular;  Laterality: Right;  . CARDIAC CATHETERIZATION    . CATARACT EXTRACTION Bilateral   .  CHOLECYSTECTOMY N/A 12/09/2014   Procedure: LAPAROSCOPIC CHOLECYSTECTOMY;  Surgeon: Marlyce [name], MD;  Location: [facility];  Service: General;  Laterality: N/A;  . CORONARY ARTERY BYPASS GRAFT N/A 07/02/2013   Procedure: CORONARY ARTERY BYPASS GRAFTING (CABG);  Surgeon: Ivin [name], MD;  Location: [place];  Service: Open Heart Surgery;  Laterality: N/A;  CABG x three, using left internal mammary artery and right leg greater saphenous vein harvested endoscopically  . ESOPHAGOGASTRODUODENOSCOPY (EGD) WITH PROPOFOL N/A 11/24/2015   Procedure: ESOPHAGOGASTRODUODENOSCOPY (EGD) WITH PROPOFOL;  Surgeon: [name], MD;  Location: [place];  Service: Endoscopy;  Laterality: N/A;  Diabetic - insulin  . EYE SURGERY Bilateral    Cataract Extraction with IOL  . INTRAOPERATIVE TRANSESOPHAGEAL ECHOCARDIOGRAM N/A 07/02/2013   Procedure: INTRAOPERATIVE TRANSESOPHAGEAL ECHOCARDIOGRAM;  Surgeon: Ivin [name], MD;  Location: [place];  Service: Open Heart Surgery;  Laterality: N/A;  . PERIPHERAL VASCULAR CATHETERIZATION Right 12/06/2015   Procedure: Dialysis/Perma Catheter Insertion;  Surgeon: Katha Cabal, MD;  Location: [place] [facility];  Service: Cardiovascular;  Laterality: Right;  . PERIPHERAL VASCULAR THROMBECTOMY Right 09/28/2016   Procedure: Peripheral Vascular Thrombectomy;  Surgeon: Katha Cabal, MD;  Location: [place] [facility];  Service: Cardiovascular;  Laterality: Right;  . PORTA CATH REMOVAL N/A 06/01/2016   Procedure: Glori [name] Cath Removal;  Surgeon: Katha Cabal, MD;  Location: [place] [facility];  Service: Cardiovascular;  Laterality: N/A;  . THORACENTESIS Left 2015    Allergies Nsaids  Social History Social History   Tobacco Use  . Smoking status: Never Smoker  . Smokeless tobacco: Never Used  Substance Use Topics  . Alcohol use: No    Alcohol/week: 0.0 oz  . Drug use: No    Review of Systems Constitutional: Negative for fever. Cardiovascular:  Negative for chest pain. Respiratory: Negative for shortness of breath. Gastrointestinal: Negative for abdominal pain, vomiting and diarrhea. Musculoskeletal: Negative for back pain. Skin: Negative for rash. Neurological: Negative for headaches, focal weakness or numbness.  All systems negative/normal/unremarkable except as stated in the HPI  ____________________________________________   PHYSICAL EXAM:  VITAL SIGNS: ED Triage Vitals  Enc Vitals Group     BP 02/22/17 1225 (!) 160/67     Pulse Rate 02/22/17 1225 80     Resp --      Temp 02/22/17 1225 98.7 F (37.1 C)     Temp Source 02/22/17 1225 Oral     SpO2 02/22/17 1225 95 %     Weight 02/22/17 1220 205 lb (93 kg)     Height 02/22/17 1220 5\' 4"  (1.626 m)     Head Circumference --      Peak Flow --      Pain Score 02/22/17 1402 0     Pain Loc --      Pain Edu? --      Excl. in [place]? --     Constitutional: Alert and oriented. Well appearing and in no distress. Eyes: Conjunctivae are normal. Normal extraocular movements. Cardiovascular: Normal rate, regular rhythm. No murmurs, rubs, or gallops.  No thrill or bruit is appreciated in the right upper extremity fistula Respiratory: Normal respiratory effort without tachypnea nor retractions. Breath sounds are clear  and equal bilaterally. No wheezes/rales/rhonchi. Gastrointestinal: Soft and nontender. Normal bowel sounds Musculoskeletal: Nontender with normal range of motion in extremities.  Neurologic:  Normal speech and language. No gross focal neurologic deficits are appreciated.  Skin:  Skin is warm, dry and intact. No rash noted. Psychiatric: Mood and affect are normal. Speech and behavior are normal.  ____________________________________________  ED COURSE:  As part of my medical decision making, I reviewed the following data within the [place] History obtained from family if available, nursing notes, old chart and ekg, as well as notes from prior ED  visits. Patient presented for possible vascular access problem, we will assess with labs and discuss with vascular surgery.   Procedures ____________________________________________   LABS (pertinent positives/negatives)  Labs Reviewed  COMPREHENSIVE METABOLIC PANEL - Abnormal; Notable for the following components:      Result Value   Chloride 94 (*)    Glucose, Bld 242 (*)    BUN 36 (*)    Creatinine, Ser 7.37 (*)    GFR calc non Af Amer 5 (*)    GFR calc Af Amer 6 (*)    All other components within normal limits  CBC WITH DIFFERENTIAL/PLATELET  ____________________________________________  DIFFERENTIAL DIAGNOSIS   End-stage renal disease on dialysis, hyperkalemia, fistula thrombosis  FINAL ASSESSMENT AND PLAN  AV fistula dysfunction  Plan: Patient had presented for dialysis access problems. Patient's labs are reassuring at this time.  She will likely need thrombectomy.  I have discussed with vascular surgery who has planned for thrombectomy given that her potassium is normal today.   Earleen Newport, MD   Note: This note was generated in part or whole with voice recognition software. Voice recognition is usually quite accurate but there are transcription errors that can and very often do occur. I apologize for any typographical errors that were not detected and corrected.     Earleen Newport, MD 02/22/17 1457    Earleen Newport, MD 02/22/17 [phone]

## 2017-02-23 LAB — PARATHYROID HORMONE, INTACT (NO CA): PTH: 135 pg/mL — ABNORMAL HIGH (ref 15–65)

## 2017-02-24 ENCOUNTER — Encounter: Payer: Self-pay | Admitting: Vascular Surgery

## 2017-02-24 DIAGNOSIS — N2581 Secondary hyperparathyroidism of renal origin: Secondary | ICD-10-CM | POA: Diagnosis not present

## 2017-02-24 DIAGNOSIS — N186 End stage renal disease: Secondary | ICD-10-CM | POA: Diagnosis not present

## 2017-02-24 DIAGNOSIS — D509 Iron deficiency anemia, unspecified: Secondary | ICD-10-CM | POA: Diagnosis not present

## 2017-02-24 DIAGNOSIS — E1122 Type 2 diabetes mellitus with diabetic chronic kidney disease: Secondary | ICD-10-CM | POA: Diagnosis not present

## 2017-02-24 DIAGNOSIS — Z23 Encounter for immunization: Secondary | ICD-10-CM | POA: Diagnosis not present

## 2017-02-24 DIAGNOSIS — D631 Anemia in chronic kidney disease: Secondary | ICD-10-CM | POA: Diagnosis not present

## 2017-02-27 DIAGNOSIS — E1122 Type 2 diabetes mellitus with diabetic chronic kidney disease: Secondary | ICD-10-CM | POA: Diagnosis not present

## 2017-02-27 DIAGNOSIS — D631 Anemia in chronic kidney disease: Secondary | ICD-10-CM | POA: Diagnosis not present

## 2017-02-27 DIAGNOSIS — N2581 Secondary hyperparathyroidism of renal origin: Secondary | ICD-10-CM | POA: Diagnosis not present

## 2017-02-27 DIAGNOSIS — N186 End stage renal disease: Secondary | ICD-10-CM | POA: Diagnosis not present

## 2017-02-27 DIAGNOSIS — D509 Iron deficiency anemia, unspecified: Secondary | ICD-10-CM | POA: Diagnosis not present

## 2017-02-27 DIAGNOSIS — Z23 Encounter for immunization: Secondary | ICD-10-CM | POA: Diagnosis not present

## 2017-03-01 DIAGNOSIS — N2581 Secondary hyperparathyroidism of renal origin: Secondary | ICD-10-CM | POA: Diagnosis not present

## 2017-03-01 DIAGNOSIS — D509 Iron deficiency anemia, unspecified: Secondary | ICD-10-CM | POA: Diagnosis not present

## 2017-03-01 DIAGNOSIS — N186 End stage renal disease: Secondary | ICD-10-CM | POA: Diagnosis not present

## 2017-03-01 DIAGNOSIS — Z23 Encounter for immunization: Secondary | ICD-10-CM | POA: Diagnosis not present

## 2017-03-01 DIAGNOSIS — E1122 Type 2 diabetes mellitus with diabetic chronic kidney disease: Secondary | ICD-10-CM | POA: Diagnosis not present

## 2017-03-01 DIAGNOSIS — D631 Anemia in chronic kidney disease: Secondary | ICD-10-CM | POA: Diagnosis not present

## 2017-03-03 DIAGNOSIS — N2581 Secondary hyperparathyroidism of renal origin: Secondary | ICD-10-CM | POA: Diagnosis not present

## 2017-03-03 DIAGNOSIS — E1122 Type 2 diabetes mellitus with diabetic chronic kidney disease: Secondary | ICD-10-CM | POA: Diagnosis not present

## 2017-03-03 DIAGNOSIS — D631 Anemia in chronic kidney disease: Secondary | ICD-10-CM | POA: Diagnosis not present

## 2017-03-03 DIAGNOSIS — Z23 Encounter for immunization: Secondary | ICD-10-CM | POA: Diagnosis not present

## 2017-03-03 DIAGNOSIS — N186 End stage renal disease: Secondary | ICD-10-CM | POA: Diagnosis not present

## 2017-03-03 DIAGNOSIS — D509 Iron deficiency anemia, unspecified: Secondary | ICD-10-CM | POA: Diagnosis not present

## 2017-03-04 DIAGNOSIS — N186 End stage renal disease: Secondary | ICD-10-CM | POA: Diagnosis not present

## 2017-03-04 DIAGNOSIS — Z992 Dependence on renal dialysis: Secondary | ICD-10-CM | POA: Diagnosis not present

## 2017-03-06 DIAGNOSIS — D509 Iron deficiency anemia, unspecified: Secondary | ICD-10-CM | POA: Diagnosis not present

## 2017-03-06 DIAGNOSIS — N2581 Secondary hyperparathyroidism of renal origin: Secondary | ICD-10-CM | POA: Diagnosis not present

## 2017-03-06 DIAGNOSIS — E1122 Type 2 diabetes mellitus with diabetic chronic kidney disease: Secondary | ICD-10-CM | POA: Diagnosis not present

## 2017-03-06 DIAGNOSIS — D631 Anemia in chronic kidney disease: Secondary | ICD-10-CM | POA: Diagnosis not present

## 2017-03-06 DIAGNOSIS — N186 End stage renal disease: Secondary | ICD-10-CM | POA: Diagnosis not present

## 2017-03-07 ENCOUNTER — Other Ambulatory Visit: Payer: Self-pay | Admitting: Family Medicine

## 2017-03-07 ENCOUNTER — Ambulatory Visit (INDEPENDENT_AMBULATORY_CARE_PROVIDER_SITE_OTHER): Payer: Medicare Other | Admitting: Vascular Surgery

## 2017-03-07 ENCOUNTER — Ambulatory Visit (INDEPENDENT_AMBULATORY_CARE_PROVIDER_SITE_OTHER): Payer: Medicare Other

## 2017-03-07 ENCOUNTER — Encounter (INDEPENDENT_AMBULATORY_CARE_PROVIDER_SITE_OTHER): Payer: Self-pay | Admitting: Vascular Surgery

## 2017-03-07 VITALS — BP 123/63 | HR 66 | Resp 16 | Wt 199.4 lb

## 2017-03-07 DIAGNOSIS — T829XXS Unspecified complication of cardiac and vascular prosthetic device, implant and graft, sequela: Secondary | ICD-10-CM | POA: Diagnosis not present

## 2017-03-07 DIAGNOSIS — Z992 Dependence on renal dialysis: Secondary | ICD-10-CM

## 2017-03-07 DIAGNOSIS — E1140 Type 2 diabetes mellitus with diabetic neuropathy, unspecified: Secondary | ICD-10-CM

## 2017-03-07 DIAGNOSIS — N186 End stage renal disease: Secondary | ICD-10-CM

## 2017-03-07 DIAGNOSIS — E785 Hyperlipidemia, unspecified: Secondary | ICD-10-CM

## 2017-03-08 ENCOUNTER — Encounter (INDEPENDENT_AMBULATORY_CARE_PROVIDER_SITE_OTHER): Payer: Self-pay | Admitting: Vascular Surgery

## 2017-03-08 DIAGNOSIS — D509 Iron deficiency anemia, unspecified: Secondary | ICD-10-CM | POA: Diagnosis not present

## 2017-03-08 DIAGNOSIS — N186 End stage renal disease: Secondary | ICD-10-CM | POA: Diagnosis not present

## 2017-03-08 DIAGNOSIS — D631 Anemia in chronic kidney disease: Secondary | ICD-10-CM | POA: Diagnosis not present

## 2017-03-08 DIAGNOSIS — N2581 Secondary hyperparathyroidism of renal origin: Secondary | ICD-10-CM | POA: Diagnosis not present

## 2017-03-08 DIAGNOSIS — E1122 Type 2 diabetes mellitus with diabetic chronic kidney disease: Secondary | ICD-10-CM | POA: Diagnosis not present

## 2017-03-08 NOTE — Progress Notes (Signed)
Subjective:    [name], female    DOB: 1956-01-14, 62 y.o.   MRN: [id number] Chief Complaint  Patient presents with  . Follow-up    6wk HDA   The patient presents for her first post procedure follow-up.  The patient is status post a shuntogram on January 23, 2017.  The patient presents today without complaint.  The patient notes an improvement in the function of her dialysis graft since her recent intervention.  The patient underwent a right upper extremity hemodialysis access duplex exam which was notable for a patent right brachial axillary AV graft anastomosis.  Patent right brachial axillary AV graft stents without evidence of restenosis.  Patent right axillary vein and subclavian vein.  Right venous branch noted at the distal to the middle upper arm. The patient denies any issues with hemodialysis such as cannulation problems, increased bleeding, decrease in doppler flow or recirculation. The patient also denies any graft skin breakdown, pain, edema, pallor or ulceration of the arm / hand.   Review of Systems  Constitutional: Negative.   HENT: Negative.   Eyes: Negative.   Respiratory: Negative.   Cardiovascular: Negative.   Gastrointestinal: Negative.   Endocrine: Negative.   Genitourinary: Negative.   Musculoskeletal: Negative.   Skin: Negative.   Allergic/Immunologic: Negative.   Neurological: Negative.   Hematological: Negative.   Psychiatric/Behavioral: Negative.       Objective:   Physical Exam  Constitutional: She is oriented to person, place, and time. She appears well-developed and well-nourished. No distress.  HENT:  Head: Normocephalic and atraumatic.  Eyes: Conjunctivae are normal. Pupils are equal, round, and reactive to light.  Neck: Normal range of motion.  Cardiovascular: Normal rate, regular rhythm, normal heart sounds and intact distal pulses.  Pulses:      Radial pulses are 2+ on the right side, and 2+ on the left side.  Right upper  extremity: Good bruit and thrill.  Skin is intact.  Pulmonary/Chest: Effort normal and breath sounds normal.  Musculoskeletal: Normal range of motion. She exhibits no edema.  Neurological: She is alert and oriented to person, place, and time.  Skin: Skin is warm and dry. She is not diaphoretic.  Psychiatric: She has a normal mood and affect. Her behavior is normal. Judgment and thought content normal.  Vitals reviewed.  BP 123/63 (BP Location: Left Arm)   Pulse 66   Resp 16   Wt 199 lb 6.4 oz (90.4 kg)   BMI 34.23 kg/m   Past Medical History:  Diagnosis Date  . Anemia   . Chronic diastolic CHF (congestive heart failure) ([place])    a. Due to ischemic cardiomyopathy. EF as low as 35%, improved to normal s/p CABG; b. echo 07/06/13: EF 55-60%, no RWMAs, mod TR, trivial pericardial effusion not c/w tamponade physiology;  c. 10/2015 Echo: EF 65%, Gr1 DD, triv AI, mild MR, mildly dil LA, mod TR, PASP 55mHg.  .Marland KitchenCoronary artery disease    a. NSTEMI 06/2013; b.cath: severe three-vessel CAD w/ EF 30% & mild-mod MR; c. s/p 3 vessel CABG 07/02/13 (LIMA-LAD, SVG-OM, and SVG-RPDA);  d. 10/2015 MV: no ischemia/infarct.  . Diabetes mellitus without complication ([place]   . Diabetic neuropathy ([facility]   . Dialysis patient ([facility]    Tues, Thurs, Sat  . ESRD (end stage renal disease) ([place]    a. 12/2015 initiated TTS dialysis.  .Marland KitchenGERD (gastroesophageal reflux disease)   . Hyperlipidemia   . Hypertension   . Hypothyroidism   .  Myocardial infarction ([name]) 2015  . Neuropathy   . Pleural effusion 2015  . Pulmonary hypertension ([place])   . Wears dentures    full lower    Social History   Socioeconomic History  . Marital status: Married    Spouse name: Not on file  . Number of children: Not on file  . Years of education: Not on file  . Highest education level: Not on file  Social Needs  . Financial resource strain: Not on file  . Food insecurity - worry: Not on file  . Food insecurity - inability: Not on  file  . Transportation needs - medical: Not on file  . Transportation needs - non-medical: Not on file  Occupational History  . Occupation: works for [employer]  Tobacco Use  . Smoking status: Never Smoker  . Smokeless tobacco: Never Used  Substance and Sexual Activity  . Alcohol use: No    Alcohol/week: 0.0 oz  . Drug use: No  . Sexual activity: Not on file  Other Topics Concern  . Not on file  Social History Narrative   Patient lives at home with her husband. Patient has 4 adult children.   Past Surgical History:  Procedure Laterality Date  . A/V FISTULAGRAM Right 12/17/2016   Procedure: A/V Fistulagram;  Surgeon: Algernon Huxley, MD;  Location: [place] [facility];  Service: Cardiovascular;  Laterality: Right;  . A/V FISTULAGRAM Right 01/07/2017   Procedure: A/V Fistulagram;  Surgeon: Algernon Huxley, MD;  Location: [place] [facility];  Service: Cardiovascular;  Laterality: Right;  . A/V SHUNT INTERVENTION N/A 02/22/2017   Procedure: A/V SHUNT INTERVENTION;  Surgeon: Algernon Huxley, MD;  Location: [place] [facility];  Service: Cardiovascular;  Laterality: N/A;  . AV FISTULA PLACEMENT Right 02/03/2016   Procedure: INSERTION OF ARTERIOVENOUS (AV) GORE-TEX GRAFT ARM ( BRACH / AXILLARY );  Surgeon: Katha Cabal, MD;  Location: [facility];  Service: Vascular;  Laterality: Right;  . CARDIAC CATHETERIZATION    . CATARACT EXTRACTION Bilateral   . CHOLECYSTECTOMY N/A 12/09/2014   Procedure: LAPAROSCOPIC CHOLECYSTECTOMY;  Surgeon: Marlyce [name], MD;  Location: [facility];  Service: General;  Laterality: N/A;  . CORONARY ARTERY BYPASS GRAFT N/A 07/02/2013   Procedure: CORONARY ARTERY BYPASS GRAFTING (CABG);  Surgeon: Ivin [name], MD;  Location: [place];  Service: Open Heart Surgery;  Laterality: N/A;  CABG x three, using left internal mammary artery and right leg greater saphenous vein harvested endoscopically  . ESOPHAGOGASTRODUODENOSCOPY (EGD) WITH PROPOFOL N/A 11/24/2015    Procedure: ESOPHAGOGASTRODUODENOSCOPY (EGD) WITH PROPOFOL;  Surgeon: [name], MD;  Location: [place];  Service: Endoscopy;  Laterality: N/A;  Diabetic - insulin  . EYE SURGERY Bilateral    Cataract Extraction with IOL  . INTRAOPERATIVE TRANSESOPHAGEAL ECHOCARDIOGRAM N/A 07/02/2013   Procedure: INTRAOPERATIVE TRANSESOPHAGEAL ECHOCARDIOGRAM;  Surgeon: Ivin [name], MD;  Location: [place];  Service: Open Heart Surgery;  Laterality: N/A;  . PERIPHERAL VASCULAR CATHETERIZATION Right 12/06/2015   Procedure: Dialysis/Perma Catheter Insertion;  Surgeon: Katha Cabal, MD;  Location: [place] [facility];  Service: Cardiovascular;  Laterality: Right;  . PERIPHERAL VASCULAR THROMBECTOMY Right 09/28/2016   Procedure: Peripheral Vascular Thrombectomy;  Surgeon: Katha Cabal, MD;  Location: [place] [facility];  Service: Cardiovascular;  Laterality: Right;  . PORTA CATH REMOVAL N/A 06/01/2016   Procedure: Glori [name] Cath Removal;  Surgeon: Katha Cabal, MD;  Location: [place] [facility];  Service: Cardiovascular;  Laterality: N/A;  .  THORACENTESIS Left 2015   Family History  Problem Relation Age of Onset  . COPD Mother   . Cancer Mother        Lung  . Pulmonary embolism Father   . Diabetes Father   . Diabetes Paternal Grandfather   . Heart disease Maternal Grandmother   . Colon cancer Neg Hx   . Colon polyps Neg Hx   . Esophageal cancer Neg Hx   . Pancreatic cancer Neg Hx   . Liver disease Neg Hx    Allergies  Allergen Reactions  . Nsaids Other (See Comments)    Contraindicated due to kidney disease.      Assessment & Plan:  The patient presents for her first post procedure follow-up.  The patient is status post a shuntogram on January 23, 2017.  The patient presents today without complaint.  The patient notes an improvement in the function of her dialysis graft since her recent intervention.  The patient underwent a right upper extremity hemodialysis access duplex  exam which was notable for a patent right brachial axillary AV graft anastomosis.  Patent right brachial axillary AV graft stents without evidence of restenosis.  Patent right axillary vein and subclavian vein.  Right venous branch noted at the distal to the middle upper arm. The patient denies any issues with hemodialysis such as cannulation problems, increased bleeding, decrease in doppler flow or recirculation. The patient also denies any graft skin breakdown, pain, edema, pallor or ulceration of the arm / hand.  1. ESRD on dialysis ([place]) - Stable Patient states improvement in the functioning of her dialysis access since her recent intervention Unremarkable duplex exam today Unremarkable physical exam The patient should continue to have duplex ultrasounds of the dialysis access every three to four months. The patient was instructed to call the office in the interim if any issues with dialysis access / doppler flow, pain, edema, pallor, fistula skin breakdown or ulceration of the arm / hand occur. The patient expressed their understanding.  - VAS US DUPLEX DIALYSIS ACCESS (AVF,AVG); Future  2. Hyperlipidemia, unspecified hyperlipidemia type - [place] Encouraged good control as its slows the progression of atherosclerotic disease  3. Type 2 diabetes mellitus with diabetic neuropathy, without long-term current use of insulin ([facility]) - Stable Encouraged good control as its slows the progression of atherosclerotic disease  Current Outpatient Medications on File Prior to Visit  Medication Sig Dispense Refill  . ACCU-CHEK FASTCLIX LANCETS MISC Check blood glucose twice daily, diagnosis code E11.9 204 each 3  . acetaminophen (TYLENOL) 325 MG tablet Take 650 mg by mouth daily as needed for moderate pain or headache.     Marland Kitchen aspirin EC 81 MG tablet Take 81 mg by mouth daily.    Marland Kitchen atorvastatin (LIPITOR) 80 MG tablet Take 1 tablet (80 mg total) by mouth daily. 90 tablet 3  . blood glucose meter kit and  supplies KIT Dispense based on patient and insurance preference. Use up to twice daily. (FOR ICD-10 E11.9) 1 each 0  . furosemide (LASIX) 20 MG tablet Take 20 mg by mouth every other day. Non-dialysis days    . glucose blood (CONTOUR NEXT TEST) test strip Please check blood glucose twice daily, diagnosis code E11.9 100 each 12  . Insulin Glargine (LANTUS SOLOSTAR) 100 UNIT/ML Solostar Pen Inject 14 Units into the skin daily at 10 pm. 15 mL 1  . Insulin Pen Needle (GLOBAL EASE INJECT PEN NEEDLES) 31G X 5 MM MISC Inject 0.1 mLs (10 Units total)  into the skin at bedtime 90 each 2  . levothyroxine (SYNTHROID, LEVOTHROID) 75 MCG tablet Take 1 tablet (75 mcg total) by mouth daily. 90 tablet 1  . lidocaine-prilocaine (EMLA) cream Apply 1 application topically as needed (port access).    . metoCLOPramide (REGLAN) 5 MG tablet Take 1 tablet (5 mg total) by mouth every 8 (eight) hours as needed for nausea. 30 tablet 1  . midodrine (PROAMATINE) 5 MG tablet Take 1 tablet (5 mg total) by mouth 2 (two) times daily with a meal. (Patient taking differently: Take 5-15 mg by mouth as directed. Take '5mg'$  by mouth twice daily with an additional '10mg'$  tablet 30 minutes before dialysis (Monday, Wednesday and Friday)) 180 tablet 3  . nitroGLYCERIN (NITROSTAT) 0.4 MG SL tablet DISSOLVE ONE TABLET UNDER THE TONGUE EVERY 5 MINUTES AS NEEDED FOR CHEST PAIN.  DO NOT EXCEED A TOTAL OF 3 DOSES IN 15 MINUTES 25 tablet 0  . omeprazole (PRILOSEC) 20 MG capsule Take 40 mg by mouth 2 (two) times daily.    . traZODone (DESYREL) 50 MG tablet TAKE 1 TABLET BY MOUTH EVERY NIGHT AT BEDTIME 30 tablet 2   No current facility-administered medications on file prior to visit.    There are no Patient Instructions on file for this visit. No Follow-up on file.  Zykeria Laguardia A Makenize Messman, PA-C

## 2017-03-11 DIAGNOSIS — N2581 Secondary hyperparathyroidism of renal origin: Secondary | ICD-10-CM | POA: Diagnosis not present

## 2017-03-11 DIAGNOSIS — N186 End stage renal disease: Secondary | ICD-10-CM | POA: Diagnosis not present

## 2017-03-11 DIAGNOSIS — D509 Iron deficiency anemia, unspecified: Secondary | ICD-10-CM | POA: Diagnosis not present

## 2017-03-11 DIAGNOSIS — E1122 Type 2 diabetes mellitus with diabetic chronic kidney disease: Secondary | ICD-10-CM | POA: Diagnosis not present

## 2017-03-11 DIAGNOSIS — D631 Anemia in chronic kidney disease: Secondary | ICD-10-CM | POA: Diagnosis not present

## 2017-03-12 DIAGNOSIS — E113513 Type 2 diabetes mellitus with proliferative diabetic retinopathy with macular edema, bilateral: Secondary | ICD-10-CM | POA: Diagnosis not present

## 2017-03-13 DIAGNOSIS — D631 Anemia in chronic kidney disease: Secondary | ICD-10-CM | POA: Diagnosis not present

## 2017-03-13 DIAGNOSIS — E1122 Type 2 diabetes mellitus with diabetic chronic kidney disease: Secondary | ICD-10-CM | POA: Diagnosis not present

## 2017-03-13 DIAGNOSIS — N186 End stage renal disease: Secondary | ICD-10-CM | POA: Diagnosis not present

## 2017-03-13 DIAGNOSIS — D509 Iron deficiency anemia, unspecified: Secondary | ICD-10-CM | POA: Diagnosis not present

## 2017-03-13 DIAGNOSIS — N2581 Secondary hyperparathyroidism of renal origin: Secondary | ICD-10-CM | POA: Diagnosis not present

## 2017-03-15 DIAGNOSIS — E1122 Type 2 diabetes mellitus with diabetic chronic kidney disease: Secondary | ICD-10-CM | POA: Diagnosis not present

## 2017-03-15 DIAGNOSIS — D509 Iron deficiency anemia, unspecified: Secondary | ICD-10-CM | POA: Diagnosis not present

## 2017-03-15 DIAGNOSIS — N2581 Secondary hyperparathyroidism of renal origin: Secondary | ICD-10-CM | POA: Diagnosis not present

## 2017-03-15 DIAGNOSIS — N186 End stage renal disease: Secondary | ICD-10-CM | POA: Diagnosis not present

## 2017-03-15 DIAGNOSIS — D631 Anemia in chronic kidney disease: Secondary | ICD-10-CM | POA: Diagnosis not present

## 2017-03-18 DIAGNOSIS — D631 Anemia in chronic kidney disease: Secondary | ICD-10-CM | POA: Diagnosis not present

## 2017-03-18 DIAGNOSIS — N2581 Secondary hyperparathyroidism of renal origin: Secondary | ICD-10-CM | POA: Diagnosis not present

## 2017-03-18 DIAGNOSIS — N186 End stage renal disease: Secondary | ICD-10-CM | POA: Diagnosis not present

## 2017-03-18 DIAGNOSIS — D509 Iron deficiency anemia, unspecified: Secondary | ICD-10-CM | POA: Diagnosis not present

## 2017-03-18 DIAGNOSIS — E1122 Type 2 diabetes mellitus with diabetic chronic kidney disease: Secondary | ICD-10-CM | POA: Diagnosis not present

## 2017-03-20 DIAGNOSIS — N2581 Secondary hyperparathyroidism of renal origin: Secondary | ICD-10-CM | POA: Diagnosis not present

## 2017-03-20 DIAGNOSIS — D509 Iron deficiency anemia, unspecified: Secondary | ICD-10-CM | POA: Diagnosis not present

## 2017-03-20 DIAGNOSIS — D631 Anemia in chronic kidney disease: Secondary | ICD-10-CM | POA: Diagnosis not present

## 2017-03-20 DIAGNOSIS — E1122 Type 2 diabetes mellitus with diabetic chronic kidney disease: Secondary | ICD-10-CM | POA: Diagnosis not present

## 2017-03-20 DIAGNOSIS — N186 End stage renal disease: Secondary | ICD-10-CM | POA: Diagnosis not present

## 2017-03-22 DIAGNOSIS — E1122 Type 2 diabetes mellitus with diabetic chronic kidney disease: Secondary | ICD-10-CM | POA: Diagnosis not present

## 2017-03-22 DIAGNOSIS — N2581 Secondary hyperparathyroidism of renal origin: Secondary | ICD-10-CM | POA: Diagnosis not present

## 2017-03-22 DIAGNOSIS — D509 Iron deficiency anemia, unspecified: Secondary | ICD-10-CM | POA: Diagnosis not present

## 2017-03-22 DIAGNOSIS — D631 Anemia in chronic kidney disease: Secondary | ICD-10-CM | POA: Diagnosis not present

## 2017-03-22 DIAGNOSIS — N186 End stage renal disease: Secondary | ICD-10-CM | POA: Diagnosis not present

## 2017-03-25 DIAGNOSIS — N186 End stage renal disease: Secondary | ICD-10-CM | POA: Diagnosis not present

## 2017-03-25 DIAGNOSIS — D631 Anemia in chronic kidney disease: Secondary | ICD-10-CM | POA: Diagnosis not present

## 2017-03-25 DIAGNOSIS — D509 Iron deficiency anemia, unspecified: Secondary | ICD-10-CM | POA: Diagnosis not present

## 2017-03-25 DIAGNOSIS — N2581 Secondary hyperparathyroidism of renal origin: Secondary | ICD-10-CM | POA: Diagnosis not present

## 2017-03-25 DIAGNOSIS — E1122 Type 2 diabetes mellitus with diabetic chronic kidney disease: Secondary | ICD-10-CM | POA: Diagnosis not present

## 2017-03-26 ENCOUNTER — Ambulatory Visit (INDEPENDENT_AMBULATORY_CARE_PROVIDER_SITE_OTHER): Payer: Medicare Other | Admitting: Family Medicine

## 2017-03-26 ENCOUNTER — Other Ambulatory Visit: Payer: Self-pay

## 2017-03-26 ENCOUNTER — Encounter: Payer: Self-pay | Admitting: Family Medicine

## 2017-03-26 DIAGNOSIS — Z992 Dependence on renal dialysis: Secondary | ICD-10-CM

## 2017-03-26 DIAGNOSIS — E1140 Type 2 diabetes mellitus with diabetic neuropathy, unspecified: Secondary | ICD-10-CM

## 2017-03-26 DIAGNOSIS — N186 End stage renal disease: Secondary | ICD-10-CM

## 2017-03-26 DIAGNOSIS — I959 Hypotension, unspecified: Secondary | ICD-10-CM

## 2017-03-26 NOTE — Progress Notes (Signed)
  [name], MD Phone: [phone]  Jamie Arias is a 62 y.o. female who presents today for follow-up.  Diabetes: Has been elevated at dialysis over the past week.  Has been in the 100s at home.  Lantus 14 units daily.  A1c was 7.1 in December.  No hypoglycemia.  No polyuria or polydipsia.  No illness.  She is following up with ophthalmology and her retina specialist.  Her blood pressure has been on the low side recently and her cardiologist placed her on Midodrin.  She takes this 30 minutes prior to dialysis and then during dialysis.  She also takes it other days as well.  Blood pressure has been dropping during dialysis.  Otherwise dialysis seems to go well.  She is on a Monday Wednesday Friday schedule.  Continues to urinate.  Social History   Tobacco Use  Smoking Status Never Smoker  Smokeless Tobacco Never Used     ROS see history of present illness  Objective  Physical Exam Vitals:   03/26/17 1109  BP: 140/64  Pulse: 62  Temp: 97.9 F (36.6 C)  SpO2: 95%    BP Readings from Last 3 Encounters:  03/26/17 140/64  03/07/17 123/63  02/22/17 (!) 174/60   Wt Readings from Last 3 Encounters:  03/26/17 201 lb 3.2 oz (91.3 kg)  03/07/17 199 lb 6.4 oz (90.4 kg)  02/22/17 205 lb (93 kg)    Physical Exam  Constitutional: No distress.  Cardiovascular: Normal rate, regular rhythm and normal heart sounds.  Pulmonary/Chest: Effort normal and breath sounds normal.  Musculoskeletal: She exhibits no edema.  Neurological: She is alert. Gait normal.  Skin: Skin is warm and dry. She is not diaphoretic.     Assessment/Plan: Please see individual problem list.  Low BP She with intermittently low BP.  She has been on Midodrin with good benefit.  She will continue to follow with her cardiologist as well as nephrology regarding blood pressures.  Diabetes mellitus, type 2 ([place]) Previously fairly well controlled.  Has been elevated at dialysis.  Discussed checking it for  the next 2 days fasting as well as 2 hours after lunch and prior to bedtime.  She will contact us on Thursday with her numbers and then we will determine the next step.  ESRD on dialysis [facility]) Has been doing fairly well and feels good on dialysis.  She did have a recent procedure after stenosis of her fistula though since then has done well.  She will continue dialysis and continue to follow with her specialist.   No orders of the defined types were placed in this encounter.   No orders of the defined types were placed in this encounter.    [name], MD [facility]

## 2017-03-26 NOTE — Patient Instructions (Signed)
Nice to see you. Please start checking your blood sugars.  You should check it fasting before breakfast and then 2 hours after lunch and then prior to bedtime.  Please call us on Thursday with your numbers.

## 2017-03-27 DIAGNOSIS — D509 Iron deficiency anemia, unspecified: Secondary | ICD-10-CM | POA: Diagnosis not present

## 2017-03-27 DIAGNOSIS — D631 Anemia in chronic kidney disease: Secondary | ICD-10-CM | POA: Diagnosis not present

## 2017-03-27 DIAGNOSIS — E1122 Type 2 diabetes mellitus with diabetic chronic kidney disease: Secondary | ICD-10-CM | POA: Diagnosis not present

## 2017-03-27 DIAGNOSIS — N2581 Secondary hyperparathyroidism of renal origin: Secondary | ICD-10-CM | POA: Diagnosis not present

## 2017-03-27 DIAGNOSIS — N186 End stage renal disease: Secondary | ICD-10-CM | POA: Diagnosis not present

## 2017-03-28 ENCOUNTER — Telehealth: Payer: Self-pay | Admitting: Family Medicine

## 2017-03-28 NOTE — Telephone Encounter (Signed)
fyi

## 2017-03-28 NOTE — Assessment & Plan Note (Signed)
Previously fairly well controlled.  Has been elevated at dialysis.  Discussed checking it for the next 2 days fasting as well as 2 hours after lunch and prior to bedtime.  She will contact us on Thursday with her numbers and then we will determine the next step.

## 2017-03-28 NOTE — Assessment & Plan Note (Signed)
Has been doing fairly well and feels good on dialysis.  She did have a recent procedure after stenosis of her fistula though since then has done well.  She will continue dialysis and continue to follow with her specialist.

## 2017-03-28 NOTE — Assessment & Plan Note (Signed)
She with intermittently low BP.  She has been on Midodrin with good benefit.  She will continue to follow with her cardiologist as well as nephrology regarding blood pressures.

## 2017-03-28 NOTE — Telephone Encounter (Signed)
Noted.  Please confirm when she is taking her Lantus.  Once I know this we can consider increasing this or adding mealtime insulin.

## 2017-03-28 NOTE — Telephone Encounter (Signed)
Copied from [place]. Topic: Quick Communication - See Telephone Encounter >> Mar 28, 2017  2:37 PM Robina Ade, Helene Kelp D wrote: CRM for notification. See Telephone encounter for: 03/28/17. Patient called to give Dr. Caryl Bis sugar readings. 1/22 131 @ 12:30pm, 1/23 257 @ 11:30am & 282 @ 8pm, 1/24 132 @ 10:15am & 286 @ 2:30pm.

## 2017-03-29 DIAGNOSIS — N2581 Secondary hyperparathyroidism of renal origin: Secondary | ICD-10-CM | POA: Diagnosis not present

## 2017-03-29 DIAGNOSIS — D631 Anemia in chronic kidney disease: Secondary | ICD-10-CM | POA: Diagnosis not present

## 2017-03-29 DIAGNOSIS — E1122 Type 2 diabetes mellitus with diabetic chronic kidney disease: Secondary | ICD-10-CM | POA: Diagnosis not present

## 2017-03-29 DIAGNOSIS — N186 End stage renal disease: Secondary | ICD-10-CM | POA: Diagnosis not present

## 2017-03-29 DIAGNOSIS — D509 Iron deficiency anemia, unspecified: Secondary | ICD-10-CM | POA: Diagnosis not present

## 2017-03-29 NOTE — Telephone Encounter (Signed)
Patient states she takes it at 9pm

## 2017-03-31 NOTE — Telephone Encounter (Signed)
Please have her switch to taking it in the morning with breakfast.  She should see what her sugars do with this change for 1 week and let us know.Marland Kitchen

## 2017-04-01 DIAGNOSIS — N2581 Secondary hyperparathyroidism of renal origin: Secondary | ICD-10-CM | POA: Diagnosis not present

## 2017-04-01 DIAGNOSIS — N186 End stage renal disease: Secondary | ICD-10-CM | POA: Diagnosis not present

## 2017-04-01 DIAGNOSIS — D509 Iron deficiency anemia, unspecified: Secondary | ICD-10-CM | POA: Diagnosis not present

## 2017-04-01 DIAGNOSIS — D631 Anemia in chronic kidney disease: Secondary | ICD-10-CM | POA: Diagnosis not present

## 2017-04-01 DIAGNOSIS — E1122 Type 2 diabetes mellitus with diabetic chronic kidney disease: Secondary | ICD-10-CM | POA: Diagnosis not present

## 2017-04-01 NOTE — Telephone Encounter (Signed)
Talked with patient DPR and voiced understanding to new regimen of taking Lantus with breakfast and monitor CBG's for one week and report to office.

## 2017-04-02 ENCOUNTER — Other Ambulatory Visit: Payer: Self-pay | Admitting: Family Medicine

## 2017-04-03 DIAGNOSIS — N186 End stage renal disease: Secondary | ICD-10-CM | POA: Diagnosis not present

## 2017-04-03 DIAGNOSIS — N2581 Secondary hyperparathyroidism of renal origin: Secondary | ICD-10-CM | POA: Diagnosis not present

## 2017-04-03 DIAGNOSIS — D631 Anemia in chronic kidney disease: Secondary | ICD-10-CM | POA: Diagnosis not present

## 2017-04-03 DIAGNOSIS — E1122 Type 2 diabetes mellitus with diabetic chronic kidney disease: Secondary | ICD-10-CM | POA: Diagnosis not present

## 2017-04-03 DIAGNOSIS — D509 Iron deficiency anemia, unspecified: Secondary | ICD-10-CM | POA: Diagnosis not present

## 2017-04-04 DIAGNOSIS — N186 End stage renal disease: Secondary | ICD-10-CM | POA: Diagnosis not present

## 2017-04-04 DIAGNOSIS — Z992 Dependence on renal dialysis: Secondary | ICD-10-CM | POA: Diagnosis not present

## 2017-04-05 DIAGNOSIS — N2581 Secondary hyperparathyroidism of renal origin: Secondary | ICD-10-CM | POA: Diagnosis not present

## 2017-04-05 DIAGNOSIS — N186 End stage renal disease: Secondary | ICD-10-CM | POA: Diagnosis not present

## 2017-04-05 DIAGNOSIS — E1122 Type 2 diabetes mellitus with diabetic chronic kidney disease: Secondary | ICD-10-CM | POA: Diagnosis not present

## 2017-04-05 DIAGNOSIS — D509 Iron deficiency anemia, unspecified: Secondary | ICD-10-CM | POA: Diagnosis not present

## 2017-04-08 ENCOUNTER — Telehealth: Payer: Self-pay | Admitting: Family Medicine

## 2017-04-08 DIAGNOSIS — E1122 Type 2 diabetes mellitus with diabetic chronic kidney disease: Secondary | ICD-10-CM | POA: Diagnosis not present

## 2017-04-08 DIAGNOSIS — D509 Iron deficiency anemia, unspecified: Secondary | ICD-10-CM | POA: Diagnosis not present

## 2017-04-08 DIAGNOSIS — N186 End stage renal disease: Secondary | ICD-10-CM | POA: Diagnosis not present

## 2017-04-08 DIAGNOSIS — N2581 Secondary hyperparathyroidism of renal origin: Secondary | ICD-10-CM | POA: Diagnosis not present

## 2017-04-08 NOTE — Telephone Encounter (Signed)
Patient's husband dropped off patient's BP readings. Check Sonnenberg's  mail drop box.

## 2017-04-09 NOTE — Telephone Encounter (Signed)
Sugars remain elevated despite change to a.m. dosing.  She should increase her Lantus by 1 unit every 3 days until her blood sugars are less than 150 fasting.  We could get her set up with Chrys Racer to follow-up as well next week.  Thanks.

## 2017-04-09 NOTE — Telephone Encounter (Signed)
In your red folder

## 2017-04-10 DIAGNOSIS — E1122 Type 2 diabetes mellitus with diabetic chronic kidney disease: Secondary | ICD-10-CM | POA: Diagnosis not present

## 2017-04-10 DIAGNOSIS — N2581 Secondary hyperparathyroidism of renal origin: Secondary | ICD-10-CM | POA: Diagnosis not present

## 2017-04-10 DIAGNOSIS — D509 Iron deficiency anemia, unspecified: Secondary | ICD-10-CM | POA: Diagnosis not present

## 2017-04-10 DIAGNOSIS — N186 End stage renal disease: Secondary | ICD-10-CM | POA: Diagnosis not present

## 2017-04-10 NOTE — Telephone Encounter (Signed)
Left message to return call, ok for pec to speak to patient or patients husband about Dr.Sonnenbergs message below and schedule with caroline welles    Sugars remain elevated despite change to a.m. dosing.  She should increase her Lantus by 1 unit every 3 days until her blood sugars are less than 150 fasting.  We could get her set up with Chrys Racer to follow-up as well next week.  Thanks.

## 2017-04-11 NOTE — Telephone Encounter (Signed)
Left message to return call, ok for pec to speak to patient or patients husband about Dr.Sonnenbergs message below and schedule with caroline welles    Sugars remain elevated despite change to a.m. dosing. She should increase her Lantus by 1 unit every 3 days until her blood sugars are less than 150 fasting. We could get her set up with Chrys Racer to follow-up as well next week. Thanks.

## 2017-04-12 DIAGNOSIS — D509 Iron deficiency anemia, unspecified: Secondary | ICD-10-CM | POA: Diagnosis not present

## 2017-04-12 DIAGNOSIS — N186 End stage renal disease: Secondary | ICD-10-CM | POA: Diagnosis not present

## 2017-04-12 DIAGNOSIS — E1122 Type 2 diabetes mellitus with diabetic chronic kidney disease: Secondary | ICD-10-CM | POA: Diagnosis not present

## 2017-04-12 DIAGNOSIS — N2581 Secondary hyperparathyroidism of renal origin: Secondary | ICD-10-CM | POA: Diagnosis not present

## 2017-04-12 NOTE — Telephone Encounter (Signed)
Left message to return call, ok for pec to speak to patient or patients husband about Dr.Sonnenbergs message below and schedule with caroline welles   Sugars remain elevated despite change to a.m. dosing. She should increase her Lantus by 1 unit every 3 days until her blood sugars are less than 150 fasting. We could get her set up with Chrys Racer to follow-up as well next week. Thanks.

## 2017-04-15 DIAGNOSIS — N2581 Secondary hyperparathyroidism of renal origin: Secondary | ICD-10-CM | POA: Diagnosis not present

## 2017-04-15 DIAGNOSIS — E1122 Type 2 diabetes mellitus with diabetic chronic kidney disease: Secondary | ICD-10-CM | POA: Diagnosis not present

## 2017-04-15 DIAGNOSIS — N186 End stage renal disease: Secondary | ICD-10-CM | POA: Diagnosis not present

## 2017-04-15 DIAGNOSIS — D509 Iron deficiency anemia, unspecified: Secondary | ICD-10-CM | POA: Diagnosis not present

## 2017-04-16 NOTE — Telephone Encounter (Signed)
Patient notified

## 2017-04-17 DIAGNOSIS — D509 Iron deficiency anemia, unspecified: Secondary | ICD-10-CM | POA: Diagnosis not present

## 2017-04-17 DIAGNOSIS — N186 End stage renal disease: Secondary | ICD-10-CM | POA: Diagnosis not present

## 2017-04-17 DIAGNOSIS — E1122 Type 2 diabetes mellitus with diabetic chronic kidney disease: Secondary | ICD-10-CM | POA: Diagnosis not present

## 2017-04-17 DIAGNOSIS — N2581 Secondary hyperparathyroidism of renal origin: Secondary | ICD-10-CM | POA: Diagnosis not present

## 2017-04-18 DIAGNOSIS — D509 Iron deficiency anemia, unspecified: Secondary | ICD-10-CM | POA: Diagnosis not present

## 2017-04-18 DIAGNOSIS — N2581 Secondary hyperparathyroidism of renal origin: Secondary | ICD-10-CM | POA: Diagnosis not present

## 2017-04-18 DIAGNOSIS — E1122 Type 2 diabetes mellitus with diabetic chronic kidney disease: Secondary | ICD-10-CM | POA: Diagnosis not present

## 2017-04-18 DIAGNOSIS — N186 End stage renal disease: Secondary | ICD-10-CM | POA: Diagnosis not present

## 2017-04-19 DIAGNOSIS — E113512 Type 2 diabetes mellitus with proliferative diabetic retinopathy with macular edema, left eye: Secondary | ICD-10-CM | POA: Diagnosis not present

## 2017-04-19 DIAGNOSIS — E113511 Type 2 diabetes mellitus with proliferative diabetic retinopathy with macular edema, right eye: Secondary | ICD-10-CM | POA: Diagnosis not present

## 2017-04-22 DIAGNOSIS — N186 End stage renal disease: Secondary | ICD-10-CM | POA: Diagnosis not present

## 2017-04-22 DIAGNOSIS — E1122 Type 2 diabetes mellitus with diabetic chronic kidney disease: Secondary | ICD-10-CM | POA: Diagnosis not present

## 2017-04-22 DIAGNOSIS — D509 Iron deficiency anemia, unspecified: Secondary | ICD-10-CM | POA: Diagnosis not present

## 2017-04-22 DIAGNOSIS — N2581 Secondary hyperparathyroidism of renal origin: Secondary | ICD-10-CM | POA: Diagnosis not present

## 2017-04-23 ENCOUNTER — Telehealth: Payer: Self-pay | Admitting: Family Medicine

## 2017-04-23 MED ORDER — INSULIN GLARGINE 100 UNIT/ML SOLOSTAR PEN: 14.0000 [IU] | PEN_INJECTOR | Freq: Every day | SUBCUTANEOUS | 1 refills | Status: DC

## 2017-04-23 NOTE — Telephone Encounter (Signed)
fyi

## 2017-04-23 NOTE — Telephone Encounter (Signed)
Patient's husband notified  

## 2017-04-23 NOTE — Telephone Encounter (Signed)
Copied from [place]. Topic: Quick Communication - See Telephone Encounter >> Apr 23, 2017  9:10 AM Synthia Innocent wrote: CRM for notification. See Telephone encounter for: Patient husband calling to let provider know that there is 135 units left in insulin pen, lantus. Was told to call and left provider know.   04/23/17.

## 2017-04-23 NOTE — Telephone Encounter (Signed)
Noted.  It does not appear that she will have enough insulin to get to her visit with Chrys Racer.  I will send a refill of Lantus in.  Unfortunately the only way to do this is to order 5 pens.  Thanks.

## 2017-04-23 NOTE — Telephone Encounter (Signed)
Patient's husband was in the office for a visit today and brought up a question regarding the patient's Lantus.  Evidently the patient has [place] at home and may run out of her Lantus before she gets to see our clinical pharmacist.  The patient wanted to know if she should just switch to the [place].  I advised that she should not and that the husband should check and see how much Lantus she has left and then contact us today to let us know how much Lantus she has left so we can determine how many pens to send in to cover her until she sees the clinical pharmacist.

## 2017-04-24 DIAGNOSIS — N2581 Secondary hyperparathyroidism of renal origin: Secondary | ICD-10-CM | POA: Diagnosis not present

## 2017-04-24 DIAGNOSIS — E1122 Type 2 diabetes mellitus with diabetic chronic kidney disease: Secondary | ICD-10-CM | POA: Diagnosis not present

## 2017-04-24 DIAGNOSIS — D509 Iron deficiency anemia, unspecified: Secondary | ICD-10-CM | POA: Diagnosis not present

## 2017-04-24 DIAGNOSIS — N186 End stage renal disease: Secondary | ICD-10-CM | POA: Diagnosis not present

## 2017-04-26 DIAGNOSIS — D509 Iron deficiency anemia, unspecified: Secondary | ICD-10-CM | POA: Diagnosis not present

## 2017-04-26 DIAGNOSIS — N2581 Secondary hyperparathyroidism of renal origin: Secondary | ICD-10-CM | POA: Diagnosis not present

## 2017-04-26 DIAGNOSIS — N186 End stage renal disease: Secondary | ICD-10-CM | POA: Diagnosis not present

## 2017-04-26 DIAGNOSIS — E1122 Type 2 diabetes mellitus with diabetic chronic kidney disease: Secondary | ICD-10-CM | POA: Diagnosis not present

## 2017-04-28 ENCOUNTER — Other Ambulatory Visit: Payer: Self-pay | Admitting: Family Medicine

## 2017-04-29 DIAGNOSIS — E1122 Type 2 diabetes mellitus with diabetic chronic kidney disease: Secondary | ICD-10-CM | POA: Diagnosis not present

## 2017-04-29 DIAGNOSIS — D509 Iron deficiency anemia, unspecified: Secondary | ICD-10-CM | POA: Diagnosis not present

## 2017-04-29 DIAGNOSIS — N186 End stage renal disease: Secondary | ICD-10-CM | POA: Diagnosis not present

## 2017-04-29 DIAGNOSIS — N2581 Secondary hyperparathyroidism of renal origin: Secondary | ICD-10-CM | POA: Diagnosis not present

## 2017-05-01 DIAGNOSIS — D509 Iron deficiency anemia, unspecified: Secondary | ICD-10-CM | POA: Diagnosis not present

## 2017-05-01 DIAGNOSIS — E1122 Type 2 diabetes mellitus with diabetic chronic kidney disease: Secondary | ICD-10-CM | POA: Diagnosis not present

## 2017-05-01 DIAGNOSIS — N186 End stage renal disease: Secondary | ICD-10-CM | POA: Diagnosis not present

## 2017-05-01 DIAGNOSIS — N2581 Secondary hyperparathyroidism of renal origin: Secondary | ICD-10-CM | POA: Diagnosis not present

## 2017-05-02 DIAGNOSIS — Z992 Dependence on renal dialysis: Secondary | ICD-10-CM | POA: Diagnosis not present

## 2017-05-02 DIAGNOSIS — N186 End stage renal disease: Secondary | ICD-10-CM | POA: Diagnosis not present

## 2017-05-03 ENCOUNTER — Other Ambulatory Visit: Payer: Self-pay | Admitting: Family Medicine

## 2017-05-03 DIAGNOSIS — N186 End stage renal disease: Secondary | ICD-10-CM | POA: Diagnosis not present

## 2017-05-03 DIAGNOSIS — D631 Anemia in chronic kidney disease: Secondary | ICD-10-CM | POA: Diagnosis not present

## 2017-05-03 DIAGNOSIS — D509 Iron deficiency anemia, unspecified: Secondary | ICD-10-CM | POA: Diagnosis not present

## 2017-05-03 DIAGNOSIS — E1122 Type 2 diabetes mellitus with diabetic chronic kidney disease: Secondary | ICD-10-CM | POA: Diagnosis not present

## 2017-05-03 DIAGNOSIS — N2581 Secondary hyperparathyroidism of renal origin: Secondary | ICD-10-CM | POA: Diagnosis not present

## 2017-05-05 NOTE — Progress Notes (Signed)
S:     Chief Complaint  Patient presents with  . Medication Management    Diabetes    Patient arrives in fair spirits, complaining of cold sx, presenting with husband, [name].  Presents for diabetes evaluation, education, and management at the request of Dr. Caryl Bis (referred on 04/08/17). Last seen by primary care provider on 03/26/2017- since then, CBGs have been uncontrolled despite medication changes. Patient was given insulin titration instructions and scheduled for follow up with pharmacy clinic. Of note, therapeutic options limited by ESRD on HD.   Today patient reports she is not feeling well 2/2 to cold and is thinking of skipping HD today. Reports poor appetite, nasal congestion, dry cough, body aches, but denies fevers. Got a flu shot at HD in the fall. Has been on HD since October 2017. Denies infection in graft but has had to have it stented. Goes to [facility] on [address], [place].  Makes urine several times a day. States gabapentin is not working for her, complaining of nighttime pain. Taking gabapentin 900 mg qHS. Planning to have teeth removed next week.  Regarding DM, reports that her CBGs have been higher recently but improving with insulin titration. States she has to buy CBG testing supplies OTC and sometimes has to go without 2/2 cost. Got letter from [place] stating that they will not cover Lantus anymore. Basaglar is on formulary. Meds are affordable.   Thinks she is on other meds but doesn't know the names. Requests I call HD center to find out.   Per HD center:  a1c - 7.1 drawn 02/18/18 Calcium acetate 666 mg Three times TIDs Vitamin D2 50,000 once monthly Ferrous sulfate 325 mg once daily HD center had her on novolog - told them this was not an active med anymore.  Renavite once daily  Patient reports Diabetes was diagnosed in 2006 - has been on insulin since ~2007.   Family/Social History: Father and grandfather and 1/2 sister all with DM; grandson  getting married in June in [place]. Husband takes care of all medications and CBG readings.   Insurance coverage/medication affordability: Medicaid  Patient reports adherence with medications.  Current diabetes medications include: Lantus 21 units daily,   Patient denies hypoglycemic events.  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  Patient denies pain/burning on urination.  Patient reports neuropathy. Patient reports visual changes. Patient reports self foot exams.   O:  Physical Exam  Constitutional: She appears well-developed and well-nourished.     Review of Systems  All other systems reviewed and are negative.    Lab Results  Component Value Date   HGBA1C 7.1 02/18/2017   Vitals:   05/06/17 0807  BP: 122/64    Lipid Panel     Component Value Date/Time   CHOL 187 10/24/2015 0415   CHOL 171 12/18/2013 0815   TRIG 102 10/24/2015 0415   HDL 48 10/24/2015 0415   HDL 63 12/18/2013 0815   CHOLHDL 3.9 10/24/2015 0415   VLDL 20 10/24/2015 0415  LDLCALC 119 (H) 10/24/2015 0415   LDLCALC 93 12/18/2013 0815   LDLDIRECT 126.9 10/31/2012 0801    Home fasting CBG: 171 today, 148 yesterday. General range: 110-240 2 hour post-prandial/random CBG: mid-200s   Clinical ASCVD: Yes ; Major ASCVD events: H/o MI High-risk conditions: h/o coronary bypass, DM, CKD, h/o CHF  A/P: #Diabetes longstanding currently uncontrolled per CBGs (self reported). Patient denies hypoglycemic events  and is able to verbalize appropriate hypoglycemia management plan. Patient reports adherence with medication. Control is suboptimal due to dietary indiscretion, sedentary lifestyle, insulin resistance, pancreatic insufficiency. Lantus non-formulary, will need to be changed to [place] in the future. Therapeutic options limited by ESRD on HD. - Increase Lantus to 23 units daily, then continue titration 1 units q3days to fasting goal of 100-130.  - Consider GLP-1 in the future - deferring today given decreased appetite/illness - Sent prescriptions for meters and supplies - Next A1C anticipated this month - per HD center, they will draw this week  #ASCVD - secondary prevention in patient aged 28-75 with DM, baseline LDL 70-189, stable ASCVD - high intensity statin indicated. LDL >70 on high-intensity statin. Per HD, they do not have lipid panel on file. - Continued Aspirin 81 mg  - Continued atorvastatin 80 mg.  - Lipid panel at next visit, consider addition of ezetimibe if LDL >70 mg/dL  Written patient instructions provided.  Total time in face to face counseling 50 minutes.    Follow up in Pharmacist Clinic Visit 2-3 weeks.  Carlean Jews, Pharm.D., BCPS, CPP PGY2 Ambulatory Care Pharmacy Resident Phone: [phone]

## 2017-05-06 ENCOUNTER — Ambulatory Visit (INDEPENDENT_AMBULATORY_CARE_PROVIDER_SITE_OTHER): Payer: Medicare Other | Admitting: Pharmacist

## 2017-05-06 ENCOUNTER — Encounter: Payer: Self-pay | Admitting: Pharmacist

## 2017-05-06 VITALS — BP 122/64 | Ht 64.0 in | Wt 207.0 lb

## 2017-05-06 DIAGNOSIS — E1140 Type 2 diabetes mellitus with diabetic neuropathy, unspecified: Secondary | ICD-10-CM

## 2017-05-06 DIAGNOSIS — I25709 Atherosclerosis of coronary artery bypass graft(s), unspecified, with unspecified angina pectoris: Secondary | ICD-10-CM

## 2017-05-06 MED ORDER — INSULIN GLARGINE 100 UNIT/ML SOLOSTAR PEN: 23.0000 [IU] | PEN_INJECTOR | Freq: Every day | SUBCUTANEOUS | 1 refills | Status: DC

## 2017-05-06 MED ORDER — ACCU-CHEK AVIVA PLUS W/DEVICE KIT: PACK | 0 refills | Status: DC

## 2017-05-06 MED ORDER — ACCU-CHEK SOFT TOUCH LANCETS MISC: 12 refills | Status: DC

## 2017-05-06 MED ORDER — GLUCOSE BLOOD VI STRP: ORAL_STRIP | 12 refills | Status: DC

## 2017-05-06 MED ORDER — ACCU-CHEK SOFTCLIX LANCET DEV KIT: PACK | 0 refills | Status: DC

## 2017-05-06 NOTE — Patient Instructions (Addendum)
Thanks for coming to see me today! I called your kidney center and they told me you are supposed to be on a few other medicines:  Calcium acetate 666 mg Three times a day with meals  Vitamin D2 50,000 once monthly Ferrous sulfate 325 mg once daily Renavite once daily  They also said your last A1C was 7.1 on 02/18/17. They will draw another A1C this week.   Changes today:  1. Increase your lantus to 23 units once a day, then continue going up by 1 unit every 3 days until first thing in the morning sugars are 100-130. If you have multiple readings less than 100, decrease by 1 unit.  When you are on your last pen of lantus, call us so we can change it to basaglar.   2. I sent prescriptions for new meters and test strips to your pharmacy.   Come back to see me in ~2-3 weeks Call with issues [phone]

## 2017-05-06 NOTE — Assessment & Plan Note (Signed)
 #  ASCVD - secondary prevention in patient aged 64-75 with DM, baseline LDL 70-189, stable ASCVD - high intensity statin indicated. LDL >70 on high-intensity statin. Per HD, they do not have lipid panel on file. - Continued Aspirin 81 mg  - Continued atorvastatin 80 mg.  - Lipid panel at next visit, consider addition of ezetimibe if LDL >70 mg/dL

## 2017-05-06 NOTE — Assessment & Plan Note (Addendum)
#  Diabetes longstanding currently uncontrolled per CBGs (self reported). Patient denies hypoglycemic events and is able to verbalize appropriate hypoglycemia management plan. Patient reports adherence with medication. Control is suboptimal due to dietary indiscretion, sedentary lifestyle, insulin resistance, pancreatic insufficiency. Lantus non-formulary, will need to be changed to [place] in the future. Therapeutic options limited by ESRD on HD. - Increase Lantus to 23 units daily, then continue titration 1 units q3days to fasting goal of 100-130.  - Consider GLP-1 in the future - deferring today given decreased appetite/illness - Sent prescriptions for meters and supplies - Next A1C anticipated this month - per HD center, they will draw this week

## 2017-05-07 DIAGNOSIS — N186 End stage renal disease: Secondary | ICD-10-CM | POA: Diagnosis not present

## 2017-05-07 DIAGNOSIS — D509 Iron deficiency anemia, unspecified: Secondary | ICD-10-CM | POA: Diagnosis not present

## 2017-05-07 DIAGNOSIS — E1122 Type 2 diabetes mellitus with diabetic chronic kidney disease: Secondary | ICD-10-CM | POA: Diagnosis not present

## 2017-05-07 DIAGNOSIS — N2581 Secondary hyperparathyroidism of renal origin: Secondary | ICD-10-CM | POA: Diagnosis not present

## 2017-05-07 DIAGNOSIS — D631 Anemia in chronic kidney disease: Secondary | ICD-10-CM | POA: Diagnosis not present

## 2017-05-08 DIAGNOSIS — N186 End stage renal disease: Secondary | ICD-10-CM | POA: Diagnosis not present

## 2017-05-08 DIAGNOSIS — N2581 Secondary hyperparathyroidism of renal origin: Secondary | ICD-10-CM | POA: Diagnosis not present

## 2017-05-08 DIAGNOSIS — D509 Iron deficiency anemia, unspecified: Secondary | ICD-10-CM | POA: Diagnosis not present

## 2017-05-08 DIAGNOSIS — E1122 Type 2 diabetes mellitus with diabetic chronic kidney disease: Secondary | ICD-10-CM | POA: Diagnosis not present

## 2017-05-08 DIAGNOSIS — D631 Anemia in chronic kidney disease: Secondary | ICD-10-CM | POA: Diagnosis not present

## 2017-05-09 ENCOUNTER — Encounter: Payer: Self-pay | Admitting: Family Medicine

## 2017-05-09 ENCOUNTER — Ambulatory Visit (INDEPENDENT_AMBULATORY_CARE_PROVIDER_SITE_OTHER): Payer: Medicare Other | Admitting: Family Medicine

## 2017-05-09 VITALS — BP 132/56 | HR 60 | Temp 98.2°F | Resp 16 | Wt 199.4 lb

## 2017-05-09 DIAGNOSIS — J209 Acute bronchitis, unspecified: Secondary | ICD-10-CM

## 2017-05-09 DIAGNOSIS — I25709 Atherosclerosis of coronary artery bypass graft(s), unspecified, with unspecified angina pectoris: Secondary | ICD-10-CM

## 2017-05-09 MED ORDER — DOXYCYCLINE HYCLATE 100 MG PO TABS: 100.0000 mg | ORAL_TABLET | Freq: Two times a day (BID) | ORAL | 0 refills | Status: DC

## 2017-05-09 NOTE — Progress Notes (Signed)
[name], female   DOB: 1956-02-17, 62 y.o.   MRN: [id number]  PCP: [name], MD  Subjective:  Jamie Arias is a 62 y.o. year old very pleasant female patient who presents with Upper Respiratory infection symptoms including nasal congestion, throat is scratchy, left ear pain, rhinitis and  cough that is non productive. Cough does not keep her up at night.  -started: 5 days ago, symptoms are not improving -previous treatments: Mucinex DM or Robitussin DM -sick contacts/travel/risks: denies flu exposure. Influenza is UTD -Hx of: allergies She is currently increasing insulin as recommended by PCP. Blood sugar has improved with increases.  Fasting today, 161 which is improved. She is following recommendations by pharmacist and has follow up appointment.   ROS-denies fever, SOB, NVD, tooth pain   Pertinent Past Medical History- T2DM, HTN, chronic systolic heart failure, CAD   Medications- reviewed  Current Outpatient Medications  Medication Sig Dispense Refill  . ACCU-CHEK FASTCLIX LANCETS MISC Check blood glucose twice daily, diagnosis code E11.9 204 each 3  . acetaminophen (TYLENOL) 325 MG tablet Take 650 mg by mouth daily as needed for moderate pain or headache.     Marland Kitchen aspirin EC 81 MG tablet Take 81 mg by mouth daily.    Marland Kitchen atorvastatin (LIPITOR) 80 MG tablet Take 1 tablet (80 mg total) by mouth daily. 90 tablet 3  . blood glucose meter kit and supplies KIT Dispense based on patient and insurance preference. Use up to twice daily. (FOR ICD-10 E11.9) 1 each 0  . Blood Glucose Monitoring Suppl (ACCU-CHEK AVIVA PLUS) w/Device KIT Use as directed to test blood sugar up to three times daily 1 kit 0  . furosemide (LASIX) 20 MG tablet Take 20 mg by mouth every other day. Non-dialysis days    . gabapentin (NEURONTIN) 300 MG capsule TAKE 1 CAPSULE(300 MG) BY MOUTH THREE TIMES DAILY 90 capsule 0  . glucose blood (ACCU-CHEK AVIVA) test strip Use as instructed to test blood  sugar up to 3 times daily 100 each 12  . Insulin Glargine (LANTUS SOLOSTAR) 100 UNIT/ML Solostar Pen Inject 23 Units into the skin daily. 15 mL 1  . Insulin Pen Needle (GLOBAL EASE INJECT PEN NEEDLES) 31G X 5 MM MISC Inject 0.1 mLs (10 Units total) into the skin at bedtime 90 each 2  . Lancets (ACCU-CHEK SOFT TOUCH) lancets Use as instructed 100 each 12  . Lancets Misc. (ACCU-CHEK SOFTCLIX LANCET DEV) KIT Use as directed to test blood sugar up to three times daily 1 kit 0  . levothyroxine (SYNTHROID, LEVOTHROID) 75 MCG tablet TAKE 1 TABLET(75 MCG) BY MOUTH DAILY 90 tablet 0  . lidocaine-prilocaine (EMLA) cream Apply 1 application topically as needed (port access).    . midodrine (PROAMATINE) 5 MG tablet Take 1 tablet (5 mg total) by mouth 2 (two) times daily with a meal. (Patient taking differently: Take 5-15 mg by mouth as directed. Take 70m by mouth twice daily with an additional 173mtablet 30 minutes before dialysis (Monday, Wednesday and Friday)) 180 tablet 3  . nitroGLYCERIN (NITROSTAT) 0.4 MG SL tablet DISSOLVE ONE TABLET UNDER THE TONGUE EVERY 5 MINUTES AS NEEDED FOR CHEST PAIN.  DO NOT EXCEED A TOTAL OF 3 DOSES IN 15 MINUTES 25 tablet 0  . omeprazole (PRILOSEC) 20 MG capsule Take 40 mg by mouth 2 (two) times daily.    . traZODone (DESYREL) 50 MG tablet TAKE 1 TABLET BY MOUTH EVERY NIGHT AT BEDTIME 30 tablet 0  No current facility-administered medications for this visit.     Objective: BP (!) 132/56 (BP Location: Left Arm, Patient Position: Sitting, Cuff Size: Normal)   Pulse 60   Temp 98.2 F (36.8 C) (Oral)   Resp 16   Wt 199 lb 6 oz (90.4 kg)   SpO2 97%   BMI 34.22 kg/m  Gen: NAD, resting comfortably HEENT: Turbinates erythematous, TMs normal, pharynx mildly erythematous with no tonsilar exudate or edema, no sinus tenderness, post nasal drip present CV: RRR no murmurs rubs or gallops Lungs: CTAB no crackles, wheeze, rhonchi Abdomen: soft/nontender/nondistended/normal bowel  sounds. No rebound or guarding.  Ext: no edema Skin: warm, dry, no rash Neuro: grossly normal, moves all extremities  Assessment/Plan: 1. Acute bronchitis, unspecified organism  Exam and history today are most consistent with bronchitis. We discussed that this is likely viral nature however with duration of symptoms, history of T2DM and symptoms that are not improving will treat with doxycyline. Avoided azithromycin today; patient is on dialysis. VSS and Lungs CTA are reassuring that symptoms are less likely pneumonia.  With cough not waking patient at night and she is doing well with OTC cough medication that was advised by pharmacist Chrys Racer, will continue OTC mucinex DM and no prednisone will be provided. As lungs are CTA and she has T2DM with a history of uncontrolled blood sugar, will avoid prednisone at this time. Advised that if her symptoms were to worsen she should let us know. Strict return precautions provided.  - doxycycline (VIBRA-TABS) 100 MG tablet; Take 1 tablet (100 mg total) by mouth 2 (two) times daily.  Dispense: 20 tablet; Refill: 0   Finally, we reviewed reasons to return to care including if symptoms worsen or persist or new concerns arise- once again particularly shortness of breath or fever.    Laurita Quint, FNP

## 2017-05-09 NOTE — Patient Instructions (Signed)
It was a pleasure meeting you today!  I have sent in a prescription for doxycyline.     Follow up if no improvement in the next 2-3 days and please keep follow up appointment with Chrys Racer our pharmacist as scheduled.   Acute Bronchitis, Adult Acute bronchitis is when air tubes (bronchi) in the lungs suddenly get swollen. The condition can make it hard to breathe. It can also cause these symptoms:  A cough.  Coughing up clear, yellow, or green mucus.  Wheezing.  Chest congestion.  Shortness of breath.  A fever.  Body aches.  Chills.  A sore throat.  Follow these instructions at home: Medicines  Take over-the-counter and prescription medicines only as told by your doctor.  If you were prescribed an antibiotic medicine, take it as told by your doctor. Do not stop taking the antibiotic even if you start to feel better. General instructions  Rest.  Drink enough fluids to keep your pee (urine) clear or pale yellow.  Avoid smoking and secondhand smoke. If you smoke and you need help quitting, ask your doctor. Quitting will help your lungs heal faster.  Use an inhaler, cool mist vaporizer, or humidifier as told by your doctor.  Keep all follow-up visits as told by your doctor. This is important. How is this prevented? To lower your risk of getting this condition again:  Wash your hands often with soap and water. If you cannot use soap and water, use hand sanitizer.  Avoid contact with people who have cold symptoms.  Try not to touch your hands to your mouth, nose, or eyes.  Make sure to get the flu shot every year.  Contact a doctor if:  Your symptoms do not get better in 2 weeks. Get help right away if:  You cough up blood.  You have chest pain.  You have very bad shortness of breath.  You become dehydrated.  You faint (pass out) or keep feeling like you are going to pass out.  You keep throwing up (vomiting).  You have a very bad  headache.  Your fever or chills gets worse. This information is not intended to replace advice given to you by your health care provider. Make sure you discuss any questions you have with your health care provider. Document Released: 08/08/2007 Document Revised: 09/28/2015 Document Reviewed: 08/10/2015 Elsevier Interactive Patient Education  [employer].

## 2017-05-10 DIAGNOSIS — N186 End stage renal disease: Secondary | ICD-10-CM | POA: Diagnosis not present

## 2017-05-10 DIAGNOSIS — E1122 Type 2 diabetes mellitus with diabetic chronic kidney disease: Secondary | ICD-10-CM | POA: Diagnosis not present

## 2017-05-10 DIAGNOSIS — D631 Anemia in chronic kidney disease: Secondary | ICD-10-CM | POA: Diagnosis not present

## 2017-05-10 DIAGNOSIS — D509 Iron deficiency anemia, unspecified: Secondary | ICD-10-CM | POA: Diagnosis not present

## 2017-05-10 DIAGNOSIS — N2581 Secondary hyperparathyroidism of renal origin: Secondary | ICD-10-CM | POA: Diagnosis not present

## 2017-05-10 LAB — HEMOGLOBIN A1C: Hemoglobin A1C: 7.9

## 2017-05-11 NOTE — Progress Notes (Signed)
I have reviewed the above note and agree. I was available to the pharmacist for consultation.  Emanie Behan, MD  

## 2017-05-14 ENCOUNTER — Telehealth: Payer: Self-pay

## 2017-05-14 ENCOUNTER — Emergency Department: Payer: Medicare Other

## 2017-05-14 ENCOUNTER — Emergency Department
Admission: EM | Admit: 2017-05-14 | Discharge: 2017-05-14 | Disposition: A | Discharge status: Home / Self Care | Payer: Medicare Other | Attending: Emergency Medicine | Admitting: Emergency Medicine

## 2017-05-14 ENCOUNTER — Encounter: Payer: Self-pay | Admitting: Emergency Medicine

## 2017-05-14 ENCOUNTER — Other Ambulatory Visit: Payer: Self-pay

## 2017-05-14 DIAGNOSIS — I2510 Atherosclerotic heart disease of native coronary artery without angina pectoris: Secondary | ICD-10-CM | POA: Diagnosis not present

## 2017-05-14 DIAGNOSIS — R251 Tremor, unspecified: Secondary | ICD-10-CM

## 2017-05-14 DIAGNOSIS — E1122 Type 2 diabetes mellitus with diabetic chronic kidney disease: Secondary | ICD-10-CM | POA: Insufficient documentation

## 2017-05-14 DIAGNOSIS — R05 Cough: Secondary | ICD-10-CM | POA: Diagnosis not present

## 2017-05-14 DIAGNOSIS — N186 End stage renal disease: Secondary | ICD-10-CM | POA: Insufficient documentation

## 2017-05-14 DIAGNOSIS — E039 Hypothyroidism, unspecified: Secondary | ICD-10-CM | POA: Diagnosis not present

## 2017-05-14 DIAGNOSIS — Z951 Presence of aortocoronary bypass graft: Secondary | ICD-10-CM | POA: Insufficient documentation

## 2017-05-14 DIAGNOSIS — I132 Hypertensive heart and chronic kidney disease with heart failure and with stage 5 chronic kidney disease, or end stage renal disease: Secondary | ICD-10-CM | POA: Insufficient documentation

## 2017-05-14 DIAGNOSIS — I252 Old myocardial infarction: Secondary | ICD-10-CM | POA: Diagnosis not present

## 2017-05-14 DIAGNOSIS — Z992 Dependence on renal dialysis: Secondary | ICD-10-CM | POA: Diagnosis not present

## 2017-05-14 DIAGNOSIS — E1140 Type 2 diabetes mellitus with diabetic neuropathy, unspecified: Secondary | ICD-10-CM | POA: Diagnosis not present

## 2017-05-14 DIAGNOSIS — I5032 Chronic diastolic (congestive) heart failure: Secondary | ICD-10-CM | POA: Insufficient documentation

## 2017-05-14 DIAGNOSIS — I120 Hypertensive chronic kidney disease with stage 5 chronic kidney disease or end stage renal disease: Secondary | ICD-10-CM | POA: Diagnosis not present

## 2017-05-14 LAB — COMPREHENSIVE METABOLIC PANEL
ALT: 12 U/L — ABNORMAL LOW (ref 14–54)
AST: 21 U/L (ref 15–41)
Albumin: 3.7 g/dL (ref 3.5–5.0)
Alkaline Phosphatase: 47 U/L (ref 38–126)
Anion gap: 17 — ABNORMAL HIGH (ref 5–15)
BUN: 59 mg/dL — ABNORMAL HIGH (ref 6–20)
CO2: 24 mmol/L (ref 22–32)
Calcium: 8.3 mg/dL — ABNORMAL LOW (ref 8.9–10.3)
Chloride: 93 mmol/L — ABNORMAL LOW (ref 101–111)
Creatinine, Ser: 10.54 mg/dL — ABNORMAL HIGH (ref 0.44–1.00)
GFR calc Af Amer: 4 mL/min — ABNORMAL LOW (ref >60)
GFR calc non Af Amer: 3 mL/min — ABNORMAL LOW (ref >60)
Glucose, Bld: 129 mg/dL — ABNORMAL HIGH (ref 65–99)
Potassium: 4.8 mmol/L (ref 3.5–5.1)
Sodium: 134 mmol/L — ABNORMAL LOW (ref 135–145)
Total Bilirubin: 1.7 mg/dL — ABNORMAL HIGH (ref 0.3–1.2)
Total Protein: 7.2 g/dL (ref 6.5–8.1)

## 2017-05-14 LAB — CBC
HCT: 34.5 % — ABNORMAL LOW (ref 35.0–47.0)
Hemoglobin: 11.9 g/dL — ABNORMAL LOW (ref 12.0–16.0)
MCH: 33.1 pg (ref 26.0–34.0)
MCHC: 34.5 g/dL (ref 32.0–36.0)
MCV: 95.9 fL (ref 80.0–100.0)
Platelets: 140 10*3/uL — ABNORMAL LOW (ref 150–440)
RBC: 3.6 MIL/uL — ABNORMAL LOW (ref 3.80–5.20)
RDW: 15.5 % — ABNORMAL HIGH (ref 11.5–14.5)
WBC: 6.4 10*3/uL (ref 3.6–11.0)

## 2017-05-14 MED ORDER — AZITHROMYCIN 500 MG PO TABS
500.0000 mg | ORAL_TABLET | Freq: Once | ORAL | Status: AC
  Administered 2017-05-14: 500 mg via ORAL
  Filled 2017-05-14: qty 1

## 2017-05-14 MED ORDER — AZITHROMYCIN 250 MG PO TABS: ORAL_TABLET | ORAL | 0 refills | Status: DC

## 2017-05-14 NOTE — ED Triage Notes (Signed)
Patient to ER for c/o tremors to whole body since Saturday. Patient denies any shortness of breath, chest pain, or any other complaints. Patient states she is a dialysis patient, missed treatment yesterday. Reports being started on doxycycline on Thursday for URI.

## 2017-05-14 NOTE — ED Notes (Signed)
Lab results and CXR reviewed. Awaiting room for MD eval. Patient observed in lobby in no acute distress.

## 2017-05-14 NOTE — ED Provider Notes (Signed)
[facility] Emergency Department Provider Note       Time seen: ----------------------------------------- 1:54 PM on 05/14/2017 -----------------------------------------   I have reviewed the triage vital signs and the nursing notes.  HISTORY   Chief Complaint Shaking    HPI Jamie Arias is a 62 y.o. female with a history of end-stage renal disease on dialysis who presents to the ED for tremors that began on Saturday.  Patient reports being placed on doxycycline on Thursday.  She was believed to have bronchitis at that time.  She is never taken doxycycline before, states that 48 hours into taking it she began having tremors.  She denies fevers, chills, chest pain, shortness of breath, vomiting or diarrhea.  Currently she is on dialysis, missed her treatment yesterday.  Past Medical History:  Diagnosis Date  . Anemia   . Chronic diastolic CHF (congestive heart failure) ([place])    a. Due to ischemic cardiomyopathy. EF as low as 35%, improved to normal s/p CABG; b. echo 07/06/13: EF 55-60%, no RWMAs, mod TR, trivial pericardial effusion not c/w tamponade physiology;  c. 10/2015 Echo: EF 65%, Gr1 DD, triv AI, mild MR, mildly dil LA, mod TR, PASP 96mmHg.  Marland Kitchen Coronary artery disease    a. NSTEMI 06/2013; b.cath: severe three-vessel CAD w/ EF 30% & mild-mod MR; c. s/p 3 vessel CABG 07/02/13 (LIMA-LAD, SVG-OM, and SVG-RPDA);  d. 10/2015 MV: no ischemia/infarct.  . Diabetes mellitus without complication ([place])   . Diabetic neuropathy ([place])   . Dialysis patient [facility])    Tues, Thurs, Sat  . ESRD (end stage renal disease) ([place])    a. 12/2015 initiated TTS dialysis.  Marland Kitchen GERD (gastroesophageal reflux disease)   . Hyperlipidemia   . Hypertension   . Hypothyroidism   . Myocardial infarction ([place]) 2015  . Neuropathy   . Pleural effusion 2015  . Pulmonary hypertension ([place])   . Wears dentures    full lower    Patient Active Problem List   Diagnosis Date Noted  .  Hyperkalemia 02/15/2017  . Complication from renal dialysis device 12/01/2016  . ESRD on dialysis ([place]) 01/19/2016  . Nausea   . GI bleed 10/25/2015  . Hypertensive heart disease 10/24/2015  . Chronic diastolic CHF (congestive heart failure) ([place]) 10/24/2015  . Unstable angina ([place]) 10/23/2015  . Low BP 09/02/2015  . Chronic systolic CHF (congestive heart failure) ([place]) 09/02/2015  . Depression 07/27/2015  . Nausea without vomiting 07/07/2015  . Encounter for routine gynecological examination 07/06/2015  . Routine general medical examination at a health care facility 07/06/2015  . Cough 05/25/2015  . Calculus of gallbladder with chronic cholecystitis without obstruction   . Pre-operative cardiovascular examination 11/30/2014  . Bilateral carotid bruits 11/30/2014  . Low magnesium levels 08/10/2013  . Anemia 08/10/2013  . Constipation 08/10/2013  . Postoperative atrial fibrillation ([place]) 07/30/2013  . Coronary artery disease   . CAD (coronary artery disease) 07/02/2013  . Acute systolic heart failure ([place]) 06/30/2013  . NSTEMI (non-ST elevated myocardial infarction) ([place]) 06/29/2013  . Sleep disorder 05/23/2012  . Hypothyroid 04/27/2012  . HTN (hypertension) 04/27/2012  . HLD (hyperlipidemia) 04/27/2012  . Diabetes mellitus, type 2 ([place]) 04/27/2012  . Neuropathy 04/27/2012    Past Surgical History:  Procedure Laterality Date  . A/V FISTULAGRAM Right 12/17/2016   Procedure: A/V Fistulagram;  Surgeon: Algernon Huxley, MD;  Location: [place] CV [facility];  Service: Cardiovascular;  Laterality: Right;  . A/V FISTULAGRAM Right 01/07/2017   Procedure: A/V  Fistulagram;  Surgeon: Algernon Huxley, MD;  Location: [place] [facility];  Service: Cardiovascular;  Laterality: Right;  . A/V SHUNT INTERVENTION N/A 02/22/2017   Procedure: A/V SHUNT INTERVENTION;  Surgeon: Algernon Huxley, MD;  Location: [place] [facility];  Service: Cardiovascular;  Laterality: N/A;  . AV FISTULA PLACEMENT Right  02/03/2016   Procedure: INSERTION OF ARTERIOVENOUS (AV) GORE-TEX GRAFT ARM ( BRACH / AXILLARY );  Surgeon: Katha Cabal, MD;  Location: [facility] ORS;  Service: Vascular;  Laterality: Right;  . CARDIAC CATHETERIZATION    . CATARACT EXTRACTION Bilateral   . CHOLECYSTECTOMY N/A 12/09/2014   Procedure: LAPAROSCOPIC CHOLECYSTECTOMY;  Surgeon: Marlyce [name], MD;  Location: [facility] OR[facility];  Service: General;  Laterality: N/A;  . CORONARY ARTERY BYPASS GRAFT N/A 07/02/2013   Procedure: CORONARY ARTERY BYPASS GRAFTING (CABG);  Surgeon: Ivin [name], MD;  Location: [place];  Service: Open Heart Surgery;  Laterality: N/A;  CABG x three, using left internal mammary artery and right leg greater saphenous vein harvested endoscopically  . ESOPHAGOGASTRODUODENOSCOPY (EGD) WITH PROPOFOL N/A 11/24/2015   Procedure: ESOPHAGOGASTRODUODENOSCOPY (EGD) WITH PROPOFOL;  Surgeon: [name], MD;  Location: [place];  Service: Endoscopy;  Laterality: N/A;  Diabetic - insulin  . EYE SURGERY Bilateral    Cataract Extraction with IOL  . INTRAOPERATIVE TRANSESOPHAGEAL ECHOCARDIOGRAM N/A 07/02/2013   Procedure: INTRAOPERATIVE TRANSESOPHAGEAL ECHOCARDIOGRAM;  Surgeon: Ivin [name], MD;  Location: [place];  Service: Open Heart Surgery;  Laterality: N/A;  . PERIPHERAL VASCULAR CATHETERIZATION Right 12/06/2015   Procedure: Dialysis/Perma Catheter Insertion;  Surgeon: Katha Cabal, MD;  Location: [place] [facility];  Service: Cardiovascular;  Laterality: Right;  . PERIPHERAL VASCULAR THROMBECTOMY Right 09/28/2016   Procedure: Peripheral Vascular Thrombectomy;  Surgeon: Katha Cabal, MD;  Location: [place] [facility];  Service: Cardiovascular;  Laterality: Right;  . PORTA CATH REMOVAL N/A 06/01/2016   Procedure: Glori [name] Cath Removal;  Surgeon: Katha Cabal, MD;  Location: [place] [facility];  Service: Cardiovascular;  Laterality: N/A;  . THORACENTESIS Left 2015    Allergies Nsaids and  Doxycycline  Social History Social History   Tobacco Use  . Smoking status: Never Smoker  . Smokeless tobacco: Never Used  Substance Use Topics  . Alcohol use: No    Alcohol/week: 0.0 oz  . Drug use: No   Review of Systems Constitutional: Negative for fever. Cardiovascular: Negative for chest pain. Respiratory: Negative for shortness of breath. Gastrointestinal: Negative for abdominal pain, vomiting and diarrhea. Musculoskeletal: Negative for back pain. Skin: Negative for rash. Neurological: Negative for headaches, focal weakness or numbness.  All systems negative/normal/unremarkable except as stated in the HPI  ____________________________________________   PHYSICAL EXAM:  VITAL SIGNS: ED Triage Vitals [05/14/17 1059]  Enc Vitals Group     BP 106/63     Pulse Rate 71     Resp 20     Temp 98.1 F (36.7 C)     Temp Source Oral     SpO2 100 %     Weight 200 lb (90.7 kg)     Height 5\' 4"  (1.626 m)     Head Circumference      Peak Flow      Pain Score      Pain Loc      Pain Edu?      Excl. in [place]?     Constitutional: Alert and oriented. Well appearing and in no distress. Eyes: Conjunctivae are normal. Normal extraocular movements. ENT  Head: Normocephalic and atraumatic.   Nose: No congestion/rhinnorhea.   Mouth/Throat: Mucous membranes are moist.   Neck: No stridor. Cardiovascular: Normal rate, regular rhythm. No murmurs, rubs, or gallops.  Right arm AV fistula with palpable thrill and bruit, no erythema Respiratory: Normal respiratory effort without tachypnea nor retractions. Breath sounds are clear and equal bilaterally. No wheezes/rales/rhonchi. Gastrointestinal: Soft and nontender. Normal bowel sounds Musculoskeletal: Nontender with normal range of motion in extremities. No lower extremity tenderness nor edema. Neurologic:  Normal speech and language. No gross focal neurologic deficits are appreciated.  Tremors noted Skin:  Skin is warm, dry  and intact. No rash noted. Psychiatric: Mood and affect are normal. Speech and behavior are normal.  ____________________________________________  ED COURSE:  As part of my medical decision making, I reviewed the following data within the [place] History obtained from family if available, nursing notes, old chart and ekg, as well as notes from prior ED visits. Patient presented for tremor and shaking, we will assess with labs as indicated at this time.   Procedures ____________________________________________   LABS (pertinent positives/negatives)  Labs Reviewed  COMPREHENSIVE METABOLIC PANEL - Abnormal; Notable for the following components:      Result Value   Sodium 134 (*)    Chloride 93 (*)    Glucose, Bld 129 (*)    BUN 59 (*)    Creatinine, Ser 10.54 (*)    Calcium 8.3 (*)    ALT 12 (*)    Total Bilirubin 1.7 (*)    GFR calc non Af Amer 3 (*)    GFR calc Af Amer 4 (*)    Anion gap 17 (*)    All other components within normal limits  CBC - Abnormal; Notable for the following components:   RBC 3.60 (*)    Hemoglobin 11.9 (*)    HCT 34.5 (*)    RDW 15.5 (*)    Platelets 140 (*)    All other components within normal limits    RADIOLOGY Chest x-ray IMPRESSION: Stable left basilar density is noted most consistent with scarring with associated pleural thickening. No significant change compared to prior exam.  ____________________________________________  DIFFERENTIAL DIAGNOSIS   Medication side effect, occult infection, dehydration, electrolyte abnormality, renal failure  FINAL ASSESSMENT AND PLAN  Tremor   Plan: The patient had presented for shaking which is thought to be secondary to antibiotic usage. Patient's labs are reflective of underlying end-stage renal disease on dialysis but no other acute process. Patient's imaging not reveal any obvious pneumonia.  I have advised that she stop the doxycycline.  She has tolerated azithromycin in  the past.  I will switch her to this she has had some cough.  She is stable for outpatient follow-up.   Laurence Aly, MD   Note: This note was generated in part or whole with voice recognition software. Voice recognition is usually quite accurate but there are transcription errors that can and very often do occur. I apologize for any typographical errors that were not detected and corrected.     Earleen Newport, MD 05/14/17 [phone]

## 2017-05-14 NOTE — ED Notes (Signed)
CXR added to orders. Patient was taking Doxycycline for possible PNA from PMD, but never had CXR.

## 2017-05-14 NOTE — Telephone Encounter (Signed)
Copied from [place] [id number]. Topic: General - Other >> May 14, 2017  4:20 PM Carolyn Stare wrote:  Pt need a hosp fup and is asking for it this week would like a call back   [phone]

## 2017-05-15 DIAGNOSIS — D509 Iron deficiency anemia, unspecified: Secondary | ICD-10-CM | POA: Diagnosis not present

## 2017-05-15 DIAGNOSIS — N186 End stage renal disease: Secondary | ICD-10-CM | POA: Diagnosis not present

## 2017-05-15 DIAGNOSIS — E1122 Type 2 diabetes mellitus with diabetic chronic kidney disease: Secondary | ICD-10-CM | POA: Diagnosis not present

## 2017-05-15 DIAGNOSIS — N2581 Secondary hyperparathyroidism of renal origin: Secondary | ICD-10-CM | POA: Diagnosis not present

## 2017-05-15 DIAGNOSIS — D631 Anemia in chronic kidney disease: Secondary | ICD-10-CM | POA: Diagnosis not present

## 2017-05-16 NOTE — Telephone Encounter (Signed)
Based on review of the chart I do not think she needs to be seen this week for follow-up.  If she has had recurrent worsening issues she should go to an urgent care or the walk-in clinic.  We can see her next Thursday at noon.

## 2017-05-16 NOTE — Telephone Encounter (Signed)
Where should I schedule?

## 2017-05-16 NOTE — Telephone Encounter (Signed)
Patients husband notified, Juliann Pulse can you please put patient on the schedule

## 2017-05-16 NOTE — Telephone Encounter (Signed)
Scheduled

## 2017-05-16 NOTE — Telephone Encounter (Signed)
Patient wants to schedule ED follow up.

## 2017-05-17 DIAGNOSIS — E1122 Type 2 diabetes mellitus with diabetic chronic kidney disease: Secondary | ICD-10-CM | POA: Diagnosis not present

## 2017-05-17 DIAGNOSIS — N186 End stage renal disease: Secondary | ICD-10-CM | POA: Diagnosis not present

## 2017-05-17 DIAGNOSIS — D509 Iron deficiency anemia, unspecified: Secondary | ICD-10-CM | POA: Diagnosis not present

## 2017-05-17 DIAGNOSIS — D631 Anemia in chronic kidney disease: Secondary | ICD-10-CM | POA: Diagnosis not present

## 2017-05-17 DIAGNOSIS — N2581 Secondary hyperparathyroidism of renal origin: Secondary | ICD-10-CM | POA: Diagnosis not present

## 2017-05-20 DIAGNOSIS — N186 End stage renal disease: Secondary | ICD-10-CM | POA: Diagnosis not present

## 2017-05-20 DIAGNOSIS — D631 Anemia in chronic kidney disease: Secondary | ICD-10-CM | POA: Diagnosis not present

## 2017-05-20 DIAGNOSIS — D509 Iron deficiency anemia, unspecified: Secondary | ICD-10-CM | POA: Diagnosis not present

## 2017-05-20 DIAGNOSIS — E1122 Type 2 diabetes mellitus with diabetic chronic kidney disease: Secondary | ICD-10-CM | POA: Diagnosis not present

## 2017-05-20 DIAGNOSIS — N2581 Secondary hyperparathyroidism of renal origin: Secondary | ICD-10-CM | POA: Diagnosis not present

## 2017-05-22 DIAGNOSIS — E1122 Type 2 diabetes mellitus with diabetic chronic kidney disease: Secondary | ICD-10-CM | POA: Diagnosis not present

## 2017-05-22 DIAGNOSIS — N186 End stage renal disease: Secondary | ICD-10-CM | POA: Diagnosis not present

## 2017-05-22 DIAGNOSIS — D509 Iron deficiency anemia, unspecified: Secondary | ICD-10-CM | POA: Diagnosis not present

## 2017-05-22 DIAGNOSIS — N2581 Secondary hyperparathyroidism of renal origin: Secondary | ICD-10-CM | POA: Diagnosis not present

## 2017-05-22 DIAGNOSIS — D631 Anemia in chronic kidney disease: Secondary | ICD-10-CM | POA: Diagnosis not present

## 2017-05-23 ENCOUNTER — Ambulatory Visit: Payer: Medicare Other | Admitting: Family Medicine

## 2017-05-24 DIAGNOSIS — N2581 Secondary hyperparathyroidism of renal origin: Secondary | ICD-10-CM | POA: Diagnosis not present

## 2017-05-24 DIAGNOSIS — D509 Iron deficiency anemia, unspecified: Secondary | ICD-10-CM | POA: Diagnosis not present

## 2017-05-24 DIAGNOSIS — D631 Anemia in chronic kidney disease: Secondary | ICD-10-CM | POA: Diagnosis not present

## 2017-05-24 DIAGNOSIS — E1122 Type 2 diabetes mellitus with diabetic chronic kidney disease: Secondary | ICD-10-CM | POA: Diagnosis not present

## 2017-05-24 DIAGNOSIS — N186 End stage renal disease: Secondary | ICD-10-CM | POA: Diagnosis not present

## 2017-05-27 ENCOUNTER — Ambulatory Visit (INDEPENDENT_AMBULATORY_CARE_PROVIDER_SITE_OTHER): Payer: Medicare Other | Admitting: Pharmacist

## 2017-05-27 ENCOUNTER — Encounter: Payer: Self-pay | Admitting: Pharmacist

## 2017-05-27 VITALS — BP 155/82 | HR 64 | Ht 64.0 in | Wt 210.0 lb

## 2017-05-27 DIAGNOSIS — D509 Iron deficiency anemia, unspecified: Secondary | ICD-10-CM | POA: Diagnosis not present

## 2017-05-27 DIAGNOSIS — Z992 Dependence on renal dialysis: Secondary | ICD-10-CM

## 2017-05-27 DIAGNOSIS — E1122 Type 2 diabetes mellitus with diabetic chronic kidney disease: Secondary | ICD-10-CM | POA: Diagnosis not present

## 2017-05-27 DIAGNOSIS — N2581 Secondary hyperparathyroidism of renal origin: Secondary | ICD-10-CM | POA: Diagnosis not present

## 2017-05-27 DIAGNOSIS — N186 End stage renal disease: Secondary | ICD-10-CM | POA: Diagnosis not present

## 2017-05-27 DIAGNOSIS — Z794 Long term (current) use of insulin: Secondary | ICD-10-CM

## 2017-05-27 DIAGNOSIS — I25709 Atherosclerosis of coronary artery bypass graft(s), unspecified, with unspecified angina pectoris: Secondary | ICD-10-CM

## 2017-05-27 DIAGNOSIS — D631 Anemia in chronic kidney disease: Secondary | ICD-10-CM | POA: Diagnosis not present

## 2017-05-27 NOTE — Progress Notes (Signed)
S:     Chief Complaint  Patient presents with  . Medication Management    Diabetes   Patient arrives in good spirits, presenting with husband, [name].  Presents for diabetes evaluation, education, and management at the request of Caryl Bis (referred on 04/08/17). Last seen by primary care provider on 03/26/2017. Last Rx Clinic visit on 05/06/17 - at that time insulin was increased and patient was given titration instructions . Of note, therapeutic options limited by ESRD on HD.   Patient reports resolution of tremor since d/c doxycycline. Allergy added. Brings CBG logbook for review. Reports Lantus dose of 25 untis daily. Reports A1C = 7.9 when checked at HD. Reports she has upcoming eye doctor appointment with retinal specialist Benjamine Mola teagans at [facility] Reports she has been "eating too much" denies decreased appetite. States she has also been drinking too much because she has had a scratchy throat. Due for HD today. States she got the new meter and testing supplies - reports it is working well. Had breakfast this morning already. Inquires about OTC meds she can take for allergies.   Patient reports Diabetes was diagnosed in 2006 - has been on insulin most of that time. Has been on HD x1.5 years  Patient reports adherence with medications.  Current diabetes medications include: Lantus 25 units daily  Patient denies hypoglycemic events.  Patient reported exercise habits: none at present, wants to start walking   Patient denies nocturia.  Patient denies pain/burning on urination.  Patient reports neuropathy. Patient denies visual changes. Patient reports self foot exams. No issues    O:  Physical Exam  Constitutional: She appears well-developed and well-nourished.     Review of Systems  Eyes: Negative for blurred vision.  Cardiovascular: Negative for chest pain and leg swelling.  All other systems reviewed and are negative.    Lab Results  Component Value Date   HGBA1C 7.9 05/10/2017   Vitals:   05/27/17 0852  BP: (!) 155/82  Pulse: 64    Lipid Panel     Component Value Date/Time   CHOL 187 10/24/2015 0415   CHOL 171 12/18/2013 0815   TRIG 102 10/24/2015 0415   HDL 48 10/24/2015 0415   HDL 63 12/18/2013 0815   CHOLHDL 3.9 10/24/2015 0415   VLDL 20 10/24/2015 0415   LDLCALC 119 (H) 10/24/2015 0415   LDLCALC 93 12/18/2013 0815   LDLDIRECT 126.9 10/31/2012 0801    Home fasting CBG: 120-180s, excursions to 103, 102, 195, 204 2 hour post-prandial/random CBG: 148, 195, 341  Clinical ASCVD: Yes ; Major ASCVD events: H/o MI High-risk conditions: h/o coronary bypass, DM, CKD, h/o CHF  A/P: #Diabetes longstanding currently uncontrolled per CBGs, however improved with A1C <8%. Patient denies hypoglycemic events and is able to verbalize appropriate hypoglycemia management plan. Patient reports adherence with medication. Control is suboptimal due to dietary indiscretion, sedentary lifestyle, insulin resistance, pancreatic insufficiency. Lantus non-formulary, will need to be changed to [place] in the future. Therapeutic options limited by ESRD on HD. - Increase Lantus to 27 units daily, then continue titration 1 units q3days to fasting goal of 100-130.  - Consider GLP-1 in the future - deferring today given relatively controlled CBGs at fasting state, very few post-prandial readings to assess control - Next A1C anticipated 08/10/2017 or later - HD to draw  #ASCVD - secondary prevention in patient aged 64-75 with DM, baseline LDL 70-189, stable ASCVD - high intensity statin indicated. LDL >70 on high-intensity statin. Per HD,  they do not have lipid panel on file. - Continued Aspirin 81 mg  - Continued atorvastatin 80 mg.  - Lipid panel at next visit, consider addition of ezetimibe if LDL >70 mg/dL  #Seasonal allergies - per patient, uncontrolled. Inquires which meds she can take -Recommended HD-dose adjusted second gen antihistamines of claritin  10 mg QOD or allegra 60 mg daily.   Written patient instructions provided.  Total time in face to face counseling 30 minutes.    Follow up in Pharmacist Clinic Visit ~1 month.   Carlean Jews, Pharm.D., BCPS, CPP PGY2 Ambulatory Care Pharmacy Resident Phone: [phone]

## 2017-05-27 NOTE — Patient Instructions (Addendum)
Thanks for coming to see me! Your sugar is looking better, but we are not perfect yet.   Increase your Lantus to 27 units daily. Test your blood sugar first thing in the morning and 2hrs after meals every few days.  For allergy meds you can take allegra (fexofenadine) 60 mg once a day OR claritin (loratadine) 10 mg every other day.   Come back to see me in ~1 month.

## 2017-05-27 NOTE — Assessment & Plan Note (Signed)
#  ASCVD - secondary prevention in patient aged 64-75 with DM, baseline LDL 70-189, stable ASCVD - high intensity statin indicated. LDL >70 on high-intensity statin. Per HD, they do not have lipid panel on file. - Continued Aspirin 81 mg  - Continued atorvastatin 80 mg.  - Lipid panel at next visit, consider addition of ezetimibe if LDL >70 mg/dL

## 2017-05-27 NOTE — Progress Notes (Signed)
I have reviewed the above note and agree. I was available to the pharmacist for consultation.  Eric Sonnenberg, MD  

## 2017-05-27 NOTE — Assessment & Plan Note (Signed)
#  Diabetes longstanding currently uncontrolled per CBGs, however improved with A1C <8%. Patient denies hypoglycemic events and is able to verbalize appropriate hypoglycemia management plan. Patient reports adherence with medication. Control is suboptimal due to dietary indiscretion, sedentary lifestyle, insulin resistance, pancreatic insufficiency. Lantus non-formulary, will need to be changed to [place] in the future. Therapeutic options limited by ESRD on HD. - Increase Lantus to 27 units daily, then continue titration 1 units q3days to fasting goal of 100-130.  - Consider GLP-1 in the future - deferring today given relatively controlled CBGs at fasting state, very few post-prandial readings to assess control - Next A1C anticipated 08/10/2017 or later - HD to draw

## 2017-05-29 DIAGNOSIS — D509 Iron deficiency anemia, unspecified: Secondary | ICD-10-CM | POA: Diagnosis not present

## 2017-05-29 DIAGNOSIS — N2581 Secondary hyperparathyroidism of renal origin: Secondary | ICD-10-CM | POA: Diagnosis not present

## 2017-05-29 DIAGNOSIS — D631 Anemia in chronic kidney disease: Secondary | ICD-10-CM | POA: Diagnosis not present

## 2017-05-29 DIAGNOSIS — E1122 Type 2 diabetes mellitus with diabetic chronic kidney disease: Secondary | ICD-10-CM | POA: Diagnosis not present

## 2017-05-29 DIAGNOSIS — N186 End stage renal disease: Secondary | ICD-10-CM | POA: Diagnosis not present

## 2017-05-30 ENCOUNTER — Ambulatory Visit (INDEPENDENT_AMBULATORY_CARE_PROVIDER_SITE_OTHER): Payer: Medicare Other

## 2017-05-30 ENCOUNTER — Ambulatory Visit (INDEPENDENT_AMBULATORY_CARE_PROVIDER_SITE_OTHER): Payer: Medicare Other | Admitting: Vascular Surgery

## 2017-05-30 DIAGNOSIS — Z992 Dependence on renal dialysis: Secondary | ICD-10-CM

## 2017-05-30 DIAGNOSIS — N186 End stage renal disease: Secondary | ICD-10-CM | POA: Diagnosis not present

## 2017-05-31 DIAGNOSIS — N2581 Secondary hyperparathyroidism of renal origin: Secondary | ICD-10-CM | POA: Diagnosis not present

## 2017-05-31 DIAGNOSIS — D631 Anemia in chronic kidney disease: Secondary | ICD-10-CM | POA: Diagnosis not present

## 2017-05-31 DIAGNOSIS — D509 Iron deficiency anemia, unspecified: Secondary | ICD-10-CM | POA: Diagnosis not present

## 2017-05-31 DIAGNOSIS — E1122 Type 2 diabetes mellitus with diabetic chronic kidney disease: Secondary | ICD-10-CM | POA: Diagnosis not present

## 2017-05-31 DIAGNOSIS — N186 End stage renal disease: Secondary | ICD-10-CM | POA: Diagnosis not present

## 2017-06-02 DIAGNOSIS — Z992 Dependence on renal dialysis: Secondary | ICD-10-CM | POA: Diagnosis not present

## 2017-06-02 DIAGNOSIS — N186 End stage renal disease: Secondary | ICD-10-CM | POA: Diagnosis not present

## 2017-06-03 DIAGNOSIS — N186 End stage renal disease: Secondary | ICD-10-CM | POA: Diagnosis not present

## 2017-06-03 DIAGNOSIS — N2581 Secondary hyperparathyroidism of renal origin: Secondary | ICD-10-CM | POA: Diagnosis not present

## 2017-06-03 DIAGNOSIS — E1122 Type 2 diabetes mellitus with diabetic chronic kidney disease: Secondary | ICD-10-CM | POA: Diagnosis not present

## 2017-06-03 DIAGNOSIS — D631 Anemia in chronic kidney disease: Secondary | ICD-10-CM | POA: Diagnosis not present

## 2017-06-04 ENCOUNTER — Other Ambulatory Visit: Payer: Self-pay | Admitting: Family Medicine

## 2017-06-05 DIAGNOSIS — E1122 Type 2 diabetes mellitus with diabetic chronic kidney disease: Secondary | ICD-10-CM | POA: Diagnosis not present

## 2017-06-05 DIAGNOSIS — N186 End stage renal disease: Secondary | ICD-10-CM | POA: Diagnosis not present

## 2017-06-05 DIAGNOSIS — D631 Anemia in chronic kidney disease: Secondary | ICD-10-CM | POA: Diagnosis not present

## 2017-06-05 DIAGNOSIS — N2581 Secondary hyperparathyroidism of renal origin: Secondary | ICD-10-CM | POA: Diagnosis not present

## 2017-06-05 NOTE — Telephone Encounter (Signed)
Last OV 031/22/19 last filled  05/06/17 30 0rf

## 2017-06-06 ENCOUNTER — Other Ambulatory Visit: Payer: Self-pay | Admitting: Family Medicine

## 2017-06-07 DIAGNOSIS — E1122 Type 2 diabetes mellitus with diabetic chronic kidney disease: Secondary | ICD-10-CM | POA: Diagnosis not present

## 2017-06-07 DIAGNOSIS — N186 End stage renal disease: Secondary | ICD-10-CM | POA: Diagnosis not present

## 2017-06-07 DIAGNOSIS — D631 Anemia in chronic kidney disease: Secondary | ICD-10-CM | POA: Diagnosis not present

## 2017-06-07 DIAGNOSIS — N2581 Secondary hyperparathyroidism of renal origin: Secondary | ICD-10-CM | POA: Diagnosis not present

## 2017-06-10 ENCOUNTER — Encounter (INDEPENDENT_AMBULATORY_CARE_PROVIDER_SITE_OTHER): Payer: Self-pay | Admitting: Vascular Surgery

## 2017-06-10 DIAGNOSIS — N2581 Secondary hyperparathyroidism of renal origin: Secondary | ICD-10-CM | POA: Diagnosis not present

## 2017-06-10 DIAGNOSIS — E1122 Type 2 diabetes mellitus with diabetic chronic kidney disease: Secondary | ICD-10-CM | POA: Diagnosis not present

## 2017-06-10 DIAGNOSIS — D631 Anemia in chronic kidney disease: Secondary | ICD-10-CM | POA: Diagnosis not present

## 2017-06-10 DIAGNOSIS — N186 End stage renal disease: Secondary | ICD-10-CM | POA: Diagnosis not present

## 2017-06-11 ENCOUNTER — Encounter (INDEPENDENT_AMBULATORY_CARE_PROVIDER_SITE_OTHER): Payer: Self-pay | Admitting: Vascular Surgery

## 2017-06-12 DIAGNOSIS — N2581 Secondary hyperparathyroidism of renal origin: Secondary | ICD-10-CM | POA: Diagnosis not present

## 2017-06-12 DIAGNOSIS — N186 End stage renal disease: Secondary | ICD-10-CM | POA: Diagnosis not present

## 2017-06-12 DIAGNOSIS — D631 Anemia in chronic kidney disease: Secondary | ICD-10-CM | POA: Diagnosis not present

## 2017-06-12 DIAGNOSIS — E1122 Type 2 diabetes mellitus with diabetic chronic kidney disease: Secondary | ICD-10-CM | POA: Diagnosis not present

## 2017-06-14 DIAGNOSIS — D631 Anemia in chronic kidney disease: Secondary | ICD-10-CM | POA: Diagnosis not present

## 2017-06-14 DIAGNOSIS — E1122 Type 2 diabetes mellitus with diabetic chronic kidney disease: Secondary | ICD-10-CM | POA: Diagnosis not present

## 2017-06-14 DIAGNOSIS — N186 End stage renal disease: Secondary | ICD-10-CM | POA: Diagnosis not present

## 2017-06-14 DIAGNOSIS — N2581 Secondary hyperparathyroidism of renal origin: Secondary | ICD-10-CM | POA: Diagnosis not present

## 2017-06-17 DIAGNOSIS — N2581 Secondary hyperparathyroidism of renal origin: Secondary | ICD-10-CM | POA: Diagnosis not present

## 2017-06-17 DIAGNOSIS — N186 End stage renal disease: Secondary | ICD-10-CM | POA: Diagnosis not present

## 2017-06-17 DIAGNOSIS — D631 Anemia in chronic kidney disease: Secondary | ICD-10-CM | POA: Diagnosis not present

## 2017-06-17 DIAGNOSIS — E1122 Type 2 diabetes mellitus with diabetic chronic kidney disease: Secondary | ICD-10-CM | POA: Diagnosis not present

## 2017-06-19 DIAGNOSIS — N2581 Secondary hyperparathyroidism of renal origin: Secondary | ICD-10-CM | POA: Diagnosis not present

## 2017-06-19 DIAGNOSIS — E1122 Type 2 diabetes mellitus with diabetic chronic kidney disease: Secondary | ICD-10-CM | POA: Diagnosis not present

## 2017-06-19 DIAGNOSIS — D631 Anemia in chronic kidney disease: Secondary | ICD-10-CM | POA: Diagnosis not present

## 2017-06-19 DIAGNOSIS — N186 End stage renal disease: Secondary | ICD-10-CM | POA: Diagnosis not present

## 2017-06-24 DIAGNOSIS — N186 End stage renal disease: Secondary | ICD-10-CM | POA: Diagnosis not present

## 2017-06-24 DIAGNOSIS — N2581 Secondary hyperparathyroidism of renal origin: Secondary | ICD-10-CM | POA: Diagnosis not present

## 2017-06-24 DIAGNOSIS — E1122 Type 2 diabetes mellitus with diabetic chronic kidney disease: Secondary | ICD-10-CM | POA: Diagnosis not present

## 2017-06-24 DIAGNOSIS — D631 Anemia in chronic kidney disease: Secondary | ICD-10-CM | POA: Diagnosis not present

## 2017-06-26 DIAGNOSIS — D631 Anemia in chronic kidney disease: Secondary | ICD-10-CM | POA: Diagnosis not present

## 2017-06-26 DIAGNOSIS — N186 End stage renal disease: Secondary | ICD-10-CM | POA: Diagnosis not present

## 2017-06-26 DIAGNOSIS — N2581 Secondary hyperparathyroidism of renal origin: Secondary | ICD-10-CM | POA: Diagnosis not present

## 2017-06-26 DIAGNOSIS — E1122 Type 2 diabetes mellitus with diabetic chronic kidney disease: Secondary | ICD-10-CM | POA: Diagnosis not present

## 2017-06-28 DIAGNOSIS — N186 End stage renal disease: Secondary | ICD-10-CM | POA: Diagnosis not present

## 2017-06-28 DIAGNOSIS — D631 Anemia in chronic kidney disease: Secondary | ICD-10-CM | POA: Diagnosis not present

## 2017-06-28 DIAGNOSIS — E1122 Type 2 diabetes mellitus with diabetic chronic kidney disease: Secondary | ICD-10-CM | POA: Diagnosis not present

## 2017-06-28 DIAGNOSIS — N2581 Secondary hyperparathyroidism of renal origin: Secondary | ICD-10-CM | POA: Diagnosis not present

## 2017-07-01 ENCOUNTER — Ambulatory Visit (INDEPENDENT_AMBULATORY_CARE_PROVIDER_SITE_OTHER): Payer: Medicare Other | Admitting: Pharmacist

## 2017-07-01 ENCOUNTER — Encounter: Payer: Self-pay | Admitting: Pharmacist

## 2017-07-01 VITALS — BP 117/73 | HR 54 | Ht 64.0 in | Wt 211.5 lb

## 2017-07-01 DIAGNOSIS — E1140 Type 2 diabetes mellitus with diabetic neuropathy, unspecified: Secondary | ICD-10-CM

## 2017-07-01 DIAGNOSIS — N186 End stage renal disease: Secondary | ICD-10-CM | POA: Diagnosis not present

## 2017-07-01 DIAGNOSIS — Z794 Long term (current) use of insulin: Secondary | ICD-10-CM | POA: Diagnosis not present

## 2017-07-01 DIAGNOSIS — D631 Anemia in chronic kidney disease: Secondary | ICD-10-CM | POA: Diagnosis not present

## 2017-07-01 DIAGNOSIS — N2581 Secondary hyperparathyroidism of renal origin: Secondary | ICD-10-CM | POA: Diagnosis not present

## 2017-07-01 DIAGNOSIS — E1122 Type 2 diabetes mellitus with diabetic chronic kidney disease: Secondary | ICD-10-CM | POA: Diagnosis not present

## 2017-07-01 DIAGNOSIS — I25709 Atherosclerosis of coronary artery bypass graft(s), unspecified, with unspecified angina pectoris: Secondary | ICD-10-CM | POA: Diagnosis not present

## 2017-07-01 MED ORDER — BASAGLAR KWIKPEN 100 UNIT/ML ~~LOC~~ SOPN: 25.0000 [IU] | PEN_INJECTOR | Freq: Every day | SUBCUTANEOUS | 6 refills | Status: DC

## 2017-07-01 NOTE — Assessment & Plan Note (Signed)
#  Diabeteslongstandingcurrentlyuncontrolled per CBGs. Patientdenieshypoglycemic eventsand is able to verbalize appropriate hypoglycemia management plan.Patient reportsadherence with medication. Control is suboptimal due todietary indiscretion, sedentary lifestyle, insulin resistance, pancreatic insufficiency.Lantus non-formulary, basaglar formulary now.Therapeutic options limited by ESRD on HD. -D/c lantus, start basaglar 25 unitsunits daily, then continue titration 1 units q1days to fasting goal of 80-130.  - Next A1C anticipated6/10/2017 or later - HD to draw

## 2017-07-01 NOTE — Assessment & Plan Note (Signed)
#  ASCVD -secondaryprevention in patient aged 64-75 with DM, baseline LDL 70-189, stable ASCVD - high intensity statin indicated. LDL >70 on high-intensity statin.HD to draw labs per patient.  -ContinuedAspirin 81mg   -Continuedatorvastatin80mg .  - Patient to inquire about lipid panel at HD, can consider addition of ezetimibe if LDL >70 mg/dL

## 2017-07-01 NOTE — Patient Instructions (Signed)
Thanks for coming to see me! Any time you have trouble getting insulin "refilled too soon" at the pharmacy, give [place] a call.   I am switching your insulin from Lantus to [place] because this is the type of insulin your insurance prefers.   Go back to 25 units of insulin (lantus until you run out, then switch to basaglar). Then, increase by 1 unit every day until your first-thing-in-the-morning sugars are between 80-130.   Come back to see me in 4-6 weeks. Please call me if you have any issues!   [phone]

## 2017-07-01 NOTE — Progress Notes (Signed)
    S:     Chief Complaint  Patient presents with  . Medication Management    Diabetes    Patient arrives in good spirits, ambulating without assistance, presenting with husband, [name].  Presents for diabetes evaluation, education, and management at the request of Dr. Caryl Bis (referred on 04/08/17).  Last Rx Clinic visit on 05/27/17 - at that time insulin was titrated up however patient's husband denies doing this as they were running out of insulin and the pharmacy told them it was too soon to refill. Husband forgot meter for today's visit. They report they get all labs at HD. Patient states they are discussing kidney transplant with nephrologist.   Insurance coverage/medication affordability: [employer] social research officer (basaglar preferred on formulary)  Patient denies adherence with medications.  Current diabetes medications include: Lantus 14 units daily   Patient denies hypoglycemic events.  Patient reported exercise habits: none at present, wants to start walking   O:  Physical Exam  Constitutional: She appears well-developed and well-nourished.   Review of Systems  All other systems reviewed and are negative.    Lab Results  Component Value Date   HGBA1C 7.9 05/10/2017   Vitals:   07/01/17 0832  BP: 117/73  Pulse: (!) 54    Lipid Panel     Component Value Date/Time   CHOL 187 10/24/2015 0415   CHOL 171 12/18/2013 0815   TRIG 102 10/24/2015 0415   HDL 48 10/24/2015 0415   HDL 63 12/18/2013 0815   CHOLHDL 3.9 10/24/2015 0415   VLDL 20 10/24/2015 0415   LDLCALC 119 (H) 10/24/2015 0415   LDLCALC 93 12/18/2013 0815   LDLDIRECT 126.9 10/31/2012 0801    Home fasting CBG: 140-170 mg/dL excursions to 102 mg/dL 2 hour post-prandial/random CBG: 250s, excursions between 290-300 mg/dL  Clinical ASCVD:Yes; Major ASCVD events:H/o MI High-risk conditions: h/o coronary bypass, DM, CKD, h/o CHF  A/P: #Diabeteslongstandingcurrentlyuncontrolled per CBGs.  Patientdenieshypoglycemic eventsand is able to verbalize appropriate hypoglycemia management plan.Patient reportsadherence with medication. Control is suboptimal due todietary indiscretion, sedentary lifestyle, insulin resistance, pancreatic insufficiency.Lantus non-formulary, basaglar formulary now. Therapeutic options limited by ESRD on HD. -D/c lantus, start basaglar 25 units units daily, then continue titration 1 units q1days to fasting goal of 80-130.  - Next A1C anticipated6/10/2017 or later - HD to draw  #ASCVD -secondaryprevention in patient aged 75-75 with DM, baseline LDL 70-189, stable ASCVD - high intensity statin indicated. LDL >70 on high-intensity statin.HD to draw labs per patient.  -ContinuedAspirin 81mg   -Continuedatorvastatin80mg .  - Patient to inquire about lipid panel at HD, can consider addition of ezetimibe if LDL >70 mg/dL  Written patient instructions provided.  Total time in face to face counseling 30 minutes.    Follow up in Pharmacist Clinic Visit 4-6 weeks.   Carlean Jews, Pharm.D., BCPS, CPP PGY2 Ambulatory Care Pharmacy Resident Phone: [phone]

## 2017-07-01 NOTE — Progress Notes (Signed)
I have reviewed the above note and agree. I was available to the pharmacist for consultation.  Eric Sonnenberg, MD  

## 2017-07-02 DIAGNOSIS — Z992 Dependence on renal dialysis: Secondary | ICD-10-CM | POA: Diagnosis not present

## 2017-07-02 DIAGNOSIS — N186 End stage renal disease: Secondary | ICD-10-CM | POA: Diagnosis not present

## 2017-07-03 DIAGNOSIS — N2581 Secondary hyperparathyroidism of renal origin: Secondary | ICD-10-CM | POA: Diagnosis not present

## 2017-07-03 DIAGNOSIS — D631 Anemia in chronic kidney disease: Secondary | ICD-10-CM | POA: Diagnosis not present

## 2017-07-03 DIAGNOSIS — N186 End stage renal disease: Secondary | ICD-10-CM | POA: Diagnosis not present

## 2017-07-03 DIAGNOSIS — E1122 Type 2 diabetes mellitus with diabetic chronic kidney disease: Secondary | ICD-10-CM | POA: Diagnosis not present

## 2017-07-05 DIAGNOSIS — N2581 Secondary hyperparathyroidism of renal origin: Secondary | ICD-10-CM | POA: Diagnosis not present

## 2017-07-05 DIAGNOSIS — D631 Anemia in chronic kidney disease: Secondary | ICD-10-CM | POA: Diagnosis not present

## 2017-07-05 DIAGNOSIS — E1122 Type 2 diabetes mellitus with diabetic chronic kidney disease: Secondary | ICD-10-CM | POA: Diagnosis not present

## 2017-07-05 DIAGNOSIS — N186 End stage renal disease: Secondary | ICD-10-CM | POA: Diagnosis not present

## 2017-07-06 ENCOUNTER — Other Ambulatory Visit: Payer: Self-pay | Admitting: Family Medicine

## 2017-07-08 DIAGNOSIS — D631 Anemia in chronic kidney disease: Secondary | ICD-10-CM | POA: Diagnosis not present

## 2017-07-08 DIAGNOSIS — E1122 Type 2 diabetes mellitus with diabetic chronic kidney disease: Secondary | ICD-10-CM | POA: Diagnosis not present

## 2017-07-08 DIAGNOSIS — N186 End stage renal disease: Secondary | ICD-10-CM | POA: Diagnosis not present

## 2017-07-08 DIAGNOSIS — N2581 Secondary hyperparathyroidism of renal origin: Secondary | ICD-10-CM | POA: Diagnosis not present

## 2017-07-10 ENCOUNTER — Encounter: Payer: Self-pay | Admitting: Family Medicine

## 2017-07-10 DIAGNOSIS — N186 End stage renal disease: Secondary | ICD-10-CM | POA: Diagnosis not present

## 2017-07-10 DIAGNOSIS — N2581 Secondary hyperparathyroidism of renal origin: Secondary | ICD-10-CM | POA: Diagnosis not present

## 2017-07-10 DIAGNOSIS — D631 Anemia in chronic kidney disease: Secondary | ICD-10-CM | POA: Diagnosis not present

## 2017-07-10 DIAGNOSIS — E1122 Type 2 diabetes mellitus with diabetic chronic kidney disease: Secondary | ICD-10-CM | POA: Diagnosis not present

## 2017-07-11 DIAGNOSIS — N186 End stage renal disease: Secondary | ICD-10-CM | POA: Diagnosis not present

## 2017-07-11 DIAGNOSIS — E1122 Type 2 diabetes mellitus with diabetic chronic kidney disease: Secondary | ICD-10-CM | POA: Diagnosis not present

## 2017-07-11 DIAGNOSIS — D631 Anemia in chronic kidney disease: Secondary | ICD-10-CM | POA: Diagnosis not present

## 2017-07-11 DIAGNOSIS — N2581 Secondary hyperparathyroidism of renal origin: Secondary | ICD-10-CM | POA: Diagnosis not present

## 2017-07-15 DIAGNOSIS — N186 End stage renal disease: Secondary | ICD-10-CM | POA: Diagnosis not present

## 2017-07-15 DIAGNOSIS — N2581 Secondary hyperparathyroidism of renal origin: Secondary | ICD-10-CM | POA: Diagnosis not present

## 2017-07-15 DIAGNOSIS — E1122 Type 2 diabetes mellitus with diabetic chronic kidney disease: Secondary | ICD-10-CM | POA: Diagnosis not present

## 2017-07-15 DIAGNOSIS — D631 Anemia in chronic kidney disease: Secondary | ICD-10-CM | POA: Diagnosis not present

## 2017-07-16 ENCOUNTER — Ambulatory Visit (INDEPENDENT_AMBULATORY_CARE_PROVIDER_SITE_OTHER): Payer: Medicare Other | Admitting: Cardiovascular Disease

## 2017-07-16 ENCOUNTER — Encounter: Payer: Self-pay | Admitting: Cardiovascular Disease

## 2017-07-16 VITALS — BP 118/62 | HR 84 | Ht 64.0 in | Wt 203.5 lb

## 2017-07-16 DIAGNOSIS — I2510 Atherosclerotic heart disease of native coronary artery without angina pectoris: Secondary | ICD-10-CM | POA: Diagnosis not present

## 2017-07-16 DIAGNOSIS — I5022 Chronic systolic (congestive) heart failure: Secondary | ICD-10-CM | POA: Diagnosis not present

## 2017-07-16 DIAGNOSIS — I10 Essential (primary) hypertension: Secondary | ICD-10-CM | POA: Diagnosis not present

## 2017-07-16 DIAGNOSIS — E785 Hyperlipidemia, unspecified: Secondary | ICD-10-CM | POA: Diagnosis not present

## 2017-07-16 NOTE — Progress Notes (Signed)
Cardiology Office Note   Date:  07/16/2017   ID:  Jamie Arias, DOB [birth year], MRN [id number]  PCP:  [name], MD  Cardiologist:   Kathlyn Sacramento, MD   No chief complaint on file.     History of Present Illness: Jamie Arias is a 62 y.o. female who presents for a followup regarding coronary artery disease status post CABG and chronic diastolic heart failure. She has known history of diabetes, end-stage renal disease on hemodialysis, anemia, hypertension and hyperlipidemia.  She is status post CABG in April 2015.   She was started on dialysis in October 2017.  Dialysis days are Mondays Wednesdays and Fridays.  Antihypertensive medications were discontinued due to hypotension during dialysis. Ejection fraction was previously reduced but most recent echocardiogram in August 2017 showed normal LV systolic function, mild mitral regurgitation and mild pulmonary hypertension.  She has been doing well from a cardiac standpoint with no recent chest pain, shortness of breath or palpitations.  She got initial evaluation for kidney transplant but did not continue the follow-up but she wants to go back.  Past Medical History:  Diagnosis Date  . Anemia   . Chronic diastolic CHF (congestive heart failure) ([place])    a. Due to ischemic cardiomyopathy. EF as low as 35%, improved to normal s/p CABG; b. echo 07/06/13: EF 55-60%, no RWMAs, mod TR, trivial pericardial effusion not c/w tamponade physiology;  c. 10/2015 Echo: EF 65%, Gr1 DD, triv AI, mild MR, mildly dil LA, mod TR, PASP 42mHg.  .Marland KitchenCoronary artery disease    a. NSTEMI 06/2013; b.cath: severe three-vessel CAD w/ EF 30% & mild-mod MR; c. s/p 3 vessel CABG 07/02/13 (LIMA-LAD, SVG-OM, and SVG-RPDA);  d. 10/2015 MV: no ischemia/infarct.  . Diabetes mellitus without complication ([facility]   . Diabetic neuropathy ([facility]   . Dialysis patient ([facility]    Tues, Thurs, Sat  . ESRD (end stage renal disease) ([facility]    a. 12/2015 initiated TTS  dialysis.  .Marland KitchenGERD (gastroesophageal reflux disease)   . Hyperlipidemia   . Hypertension   . Hypothyroidism   . [place]ocardial infarction ([facility] 2015  . Neuropathy   . Pleural effusion 2015  . Pulmonary hypertension ([facility]   . Wears dentures    full lower    Past Surgical History:  Procedure Laterality Date  . A/V FISTULAGRAM Right 12/17/2016   Procedure: A/V Fistulagram;  Surgeon: DAlgernon Huxley MD;  [facility];  Service: Cardiovascular;  Laterality: Right;  . A/V FISTULAGRAM Right 01/07/2017   Procedure: A/V Fistulagram;  Surgeon: DAlgernon Huxley MD;  [facility];  Service: Cardiovascular;  Laterality: Right;  . A/V SHUNT INTERVENTION N/A 02/22/2017   Procedure: A/V SHUNT INTERVENTION;  Surgeon: DAlgernon Huxley MD;  [facility];  Service: Cardiovascular;  Laterality: N/A;  . AV FISTULA PLACEMENT Right 02/03/2016   Procedure: INSERTION OF ARTERIOVENOUS (AV) GORE-TEX GRAFT ARM ( BRACH / AXILLARY );  Surgeon: GKatha Cabal MD;  Location: [facility];  Service: Vascular;  Laterality: Right;  . CARDIAC CATHETERIZATION    . CATARACT EXTRACTION Bilateral   . CHOLECYSTECTOMY N/A 12/09/2014   Procedure: LAPAROSCOPIC CHOLECYSTECTOMY;  Surgeon: CMarlyce [name] MD;  Location: [facility];  Service: General;  Laterality: N/A;  . CORONARY ARTERY BYPASS GRAFT N/A 07/02/2013   Procedure: CORONARY ARTERY BYPASS GRAFTING (CABG);  Surgeon: PIvin [name] MD;  [place] [facility]  Service: Open Heart Surgery;  Laterality: N/A;  CABG x three, using left internal mammary artery and right leg greater saphenous vein harvested endoscopically  . ESOPHAGOGASTRODUODENOSCOPY (EGD) WITH PROPOFOL N/A 11/24/2015   Procedure: ESOPHAGOGASTRODUODENOSCOPY (EGD) WITH PROPOFOL;  Surgeon: [name], MD;  Location: [place];  Service: Endoscopy;  Laterality: N/A;  Diabetic - insulin  . EYE SURGERY Bilateral    Cataract Extraction with IOL  . INTRAOPERATIVE  TRANSESOPHAGEAL ECHOCARDIOGRAM N/A 07/02/2013   Procedure: INTRAOPERATIVE TRANSESOPHAGEAL ECHOCARDIOGRAM;  Surgeon: Ivin [name], MD;  Location: [place];  Service: Open Heart Surgery;  Laterality: N/A;  . PERIPHERAL VASCULAR CATHETERIZATION Right 12/06/2015   Procedure: Dialysis/Perma Catheter Insertion;  Surgeon: Katha Cabal, MD;  Location: [place] [facility];  Service: Cardiovascular;  Laterality: Right;  . PERIPHERAL VASCULAR THROMBECTOMY Right 09/28/2016   Procedure: Peripheral Vascular Thrombectomy;  Surgeon: Katha Cabal, MD;  Location: [place] [facility];  Service: Cardiovascular;  Laterality: Right;  . PORTA CATH REMOVAL N/A 06/01/2016   Procedure: Glori [name] Cath Removal;  Surgeon: Katha Cabal, MD;  Location: [place] [facility];  Service: Cardiovascular;  Laterality: N/A;  . THORACENTESIS Left 2015     Current Outpatient Medications  Medication Sig Dispense Refill  . ACCU-CHEK FASTCLIX LANCETS MISC Check blood glucose twice daily, diagnosis code E11.9 204 each 3  . acetaminophen (TYLENOL) 325 MG tablet Take 650 mg by mouth daily as needed for moderate pain or headache.     Marland Kitchen aspirin EC 81 MG tablet Take 81 mg by mouth daily.    Marland Kitchen atorvastatin (LIPITOR) 80 MG tablet Take 1 tablet (80 mg total) by mouth daily. 90 tablet 3  . b complex-vitamin c-folic acid (NEPHRO-VITE) 0.8 MG TABS tablet Take 1 tablet by mouth at bedtime.    . blood glucose meter kit and supplies KIT Dispense based on patient and insurance preference. Use up to twice daily. (FOR ICD-10 E11.9) 1 each 0  . Blood Glucose Monitoring Suppl (ACCU-CHEK AVIVA PLUS) w/Device KIT Use as directed to test blood sugar up to three times daily 1 kit 0  . calcium acetate (PHOSLO) 667 MG capsule Take 667 mg by mouth 3 (three) times daily with meals.    . Calcium Acetate 668 (169 Ca) MG TABS Take 3 tablets by mouth 3 (three) times daily with meals.  11  . ergocalciferol (VITAMIN D2) 50000 units capsule Take 50,000 Units  by mouth once a week.     . ferrous sulfate 325 (65 FE) MG tablet Take 325 mg by mouth daily with breakfast.    . furosemide (LASIX) 20 MG tablet Take 20 mg by mouth every other day. Non-dialysis days    . gabapentin (NEURONTIN) 300 MG capsule TAKE 1 CAPSULE(300 MG) BY MOUTH THREE TIMES DAILY 90 capsule 0  . glucose blood (ACCU-CHEK AVIVA) test strip Use as instructed to test blood sugar up to 3 times daily 100 each 12  . Insulin Glargine (BASAGLAR KWIKPEN) 100 UNIT/ML SOPN Inject 0.25-0.35 mLs (25-35 Units total) into the skin daily. 15 mL 6  . Insulin Pen Needle (GLOBAL EASE INJECT PEN NEEDLES) 31G X 5 MM MISC Inject 0.1 mLs (10 Units total) into the skin at bedtime 90 each 2  . Lancets (ACCU-CHEK SOFT TOUCH) lancets Use as instructed 100 each 12  . Lancets Misc. (ACCU-CHEK SOFTCLIX LANCET DEV) KIT Use as directed to test blood sugar up to three times daily 1 kit 0  . levothyroxine (SYNTHROID, LEVOTHROID) 75 MCG tablet TAKE 1 TABLET(75 MCG) BY MOUTH DAILY 90 tablet  0  . lidocaine-prilocaine (EMLA) cream Apply 1 application topically as needed (port access).    . midodrine (PROAMATINE) 5 MG tablet Take 5 mg by mouth as directed.    . nitroGLYCERIN (NITROSTAT) 0.4 MG SL tablet DISSOLVE ONE TABLET UNDER THE TONGUE EVERY 5 MINUTES AS NEEDED FOR CHEST PAIN.  DO NOT EXCEED A TOTAL OF 3 DOSES IN 15 MINUTES 25 tablet 0  . omeprazole (PRILOSEC) 20 MG capsule Take 40 mg by mouth 2 (two) times daily.    . traZODone (DESYREL) 50 MG tablet TAKE 1 TABLET BY MOUTH EVERY NIGHT AT BEDTIME 90 tablet 3   No current facility-administered medications for this visit.     Allergies:   Nsaids and Doxycycline    Social History:  The patient  reports that she has never smoked. She has never used smokeless tobacco. She reports that she does not drink alcohol or use drugs.   Family History:  The patient's family history includes COPD in her mother; Cancer in her mother; Diabetes in her father and paternal grandfather;  Heart disease in her maternal grandmother; Pulmonary embolism in her father.    ROS:  Please see the history of present illness.   Otherwise, review of systems are positive for none.   All other systems are reviewed and negative.    PHYSICAL EXAM: VS:  BP 118/62   Pulse 84   Ht '5\' 4"'$  (1.626 m)   Wt 203 lb 8 oz (92.3 kg)   SpO2 98%   BMI 34.93 kg/m  , BMI Body mass index is 34.93 kg/m. GEN: Well nourished, well developed, in no acute distress  HEENT: normal  Neck: no JVD, carotid bruits, or masses Cardiac: RRR; no murmurs, rubs, or gallops, trace edema  Respiratory:  clear to auscultation bilaterally, normal work of breathing GI: soft, nontender, nondistended, + BS MS: no deformity or atrophy  Skin: warm and dry, no rash Neuro:  Strength and sensation are intact Psych: euthymic mood, full affect   EKG:  EKG is ordered today. EKG showed normal sinus rhythm with no significant ST or T wave changes.   Recent Labs: 07/25/2016: TSH 1.36 05/14/2017: ALT 12; BUN 59; Creatinine, Ser 10.54; Hemoglobin 11.9; Platelets 140; Potassium 4.8; Sodium 134    Lipid Panel    Component Value Date/Time   CHOL 187 10/24/2015 0415   CHOL 171 12/18/2013 0815   TRIG 102 10/24/2015 0415   HDL 48 10/24/2015 0415   HDL 63 12/18/2013 0815   CHOLHDL 3.9 10/24/2015 0415   VLDL 20 10/24/2015 0415   LDLCALC 119 (H) 10/24/2015 0415   LDLCALC 93 12/18/2013 0815   LDLDIRECT 126.9 10/31/2012 0801      Wt Readings from Last 3 Encounters:  07/16/17 203 lb 8 oz (92.3 kg)  07/01/17 211 lb 8 oz (95.9 kg)  05/27/17 210 lb (95.3 kg)       ASSESSMENT AND PLAN:  1.  Chronic systolic/diastolic heart failure: Most recent ejection fraction was normal .  She is off all antihypertensive medications due to low blood pressure during dialysis.  She appears to be euvolemic fluids are being managed mostly by dialysis.  2. Coronary artery disease involving native coronary arteries without angina: She is overall  doing reasonably well.  Continue medical Therapy.  3.  Hypotension associated with dialysis: Improved with daily Midodrin.    4.  Hyperlipidemia: Continue high-dose atorvastatin.  I requested lipid and liver profile.  If LDL remains above 70, I recommend adding Zetia.  Disposition:   FU with me in 6 months  Signed,  Kathlyn Sacramento, MD  07/16/2017 2:54 PM    [facility]

## 2017-07-16 NOTE — Patient Instructions (Signed)
Medication Instructions: Your physician recommends that you continue on your current medications as directed. Please refer to the Current Medication list given to you today.  If you need a refill on your cardiac medications before your next appointment, please call your pharmacy.   Labwork: Liver and Lipid today   Follow-Up: Your physician wants you to follow-up in 6 months with Dr. Fletcher Anon. You will receive a reminder letter in the mail two months in advance. If you don't receive a letter, please call our office at [phone] to schedule this follow-up appointment.  Thank you for choosing Heartcare at [facility]!

## 2017-07-17 DIAGNOSIS — E1122 Type 2 diabetes mellitus with diabetic chronic kidney disease: Secondary | ICD-10-CM | POA: Diagnosis not present

## 2017-07-17 DIAGNOSIS — N2581 Secondary hyperparathyroidism of renal origin: Secondary | ICD-10-CM | POA: Diagnosis not present

## 2017-07-17 DIAGNOSIS — D631 Anemia in chronic kidney disease: Secondary | ICD-10-CM | POA: Diagnosis not present

## 2017-07-17 DIAGNOSIS — N186 End stage renal disease: Secondary | ICD-10-CM | POA: Diagnosis not present

## 2017-07-17 LAB — HEPATIC FUNCTION PANEL
ALT: 15 IU/L (ref 0–32)
AST: 26 IU/L (ref 0–40)
Albumin: 4.2 g/dL (ref 3.6–4.8)
Alkaline Phosphatase: 72 IU/L (ref 39–117)
Bilirubin Total: 0.4 mg/dL (ref 0.0–1.2)
Bilirubin, Direct: 0.12 mg/dL (ref 0.00–0.40)
Total Protein: 7.2 g/dL (ref 6.0–8.5)

## 2017-07-17 LAB — LIPID PANEL
Chol/HDL Ratio: 3.7 ratio (ref 0.0–4.4)
Cholesterol, Total: 172 mg/dL (ref 100–199)
HDL: 47 mg/dL (ref >39)
LDL Calculated: 91 mg/dL (ref 0–99)
Triglycerides: 170 mg/dL — ABNORMAL HIGH (ref 0–149)
VLDL Cholesterol Cal: 34 mg/dL (ref 5–40)

## 2017-07-19 ENCOUNTER — Telehealth: Payer: Self-pay | Admitting: Cardiovascular Disease

## 2017-07-19 ENCOUNTER — Other Ambulatory Visit: Payer: Self-pay | Admitting: *Deleted

## 2017-07-19 DIAGNOSIS — N2581 Secondary hyperparathyroidism of renal origin: Secondary | ICD-10-CM | POA: Diagnosis not present

## 2017-07-19 DIAGNOSIS — N186 End stage renal disease: Secondary | ICD-10-CM | POA: Diagnosis not present

## 2017-07-19 DIAGNOSIS — D631 Anemia in chronic kidney disease: Secondary | ICD-10-CM | POA: Diagnosis not present

## 2017-07-19 DIAGNOSIS — E1122 Type 2 diabetes mellitus with diabetic chronic kidney disease: Secondary | ICD-10-CM | POA: Diagnosis not present

## 2017-07-19 DIAGNOSIS — Z79899 Other long term (current) drug therapy: Secondary | ICD-10-CM

## 2017-07-19 DIAGNOSIS — E785 Hyperlipidemia, unspecified: Secondary | ICD-10-CM

## 2017-07-19 MED ORDER — EZETIMIBE 10 MG PO TABS: 10.0000 mg | ORAL_TABLET | Freq: Every day | ORAL | 3 refills | Status: DC

## 2017-07-19 NOTE — Telephone Encounter (Signed)
No answer. Left message to call back.   

## 2017-07-19 NOTE — Telephone Encounter (Signed)
Patient returning call. She verbalized understanding of results and to start zetia daily and to go to [facility] (fasting) around July 17th, 2019 for repeat labs. Orders entered and Rx sent to pharmacy.

## 2017-07-19 NOTE — Telephone Encounter (Signed)
Patient husband Jamie Arias returning call for lab results  Please call Jamie Arias at [phone]

## 2017-07-21 ENCOUNTER — Encounter: Payer: Self-pay | Admitting: Family Medicine

## 2017-07-22 DIAGNOSIS — E1122 Type 2 diabetes mellitus with diabetic chronic kidney disease: Secondary | ICD-10-CM | POA: Diagnosis not present

## 2017-07-22 DIAGNOSIS — N186 End stage renal disease: Secondary | ICD-10-CM | POA: Diagnosis not present

## 2017-07-22 DIAGNOSIS — D631 Anemia in chronic kidney disease: Secondary | ICD-10-CM | POA: Diagnosis not present

## 2017-07-22 DIAGNOSIS — N2581 Secondary hyperparathyroidism of renal origin: Secondary | ICD-10-CM | POA: Diagnosis not present

## 2017-07-24 DIAGNOSIS — D631 Anemia in chronic kidney disease: Secondary | ICD-10-CM | POA: Diagnosis not present

## 2017-07-24 DIAGNOSIS — N2581 Secondary hyperparathyroidism of renal origin: Secondary | ICD-10-CM | POA: Diagnosis not present

## 2017-07-24 DIAGNOSIS — E1122 Type 2 diabetes mellitus with diabetic chronic kidney disease: Secondary | ICD-10-CM | POA: Diagnosis not present

## 2017-07-24 DIAGNOSIS — N186 End stage renal disease: Secondary | ICD-10-CM | POA: Diagnosis not present

## 2017-07-25 ENCOUNTER — Ambulatory Visit (INDEPENDENT_AMBULATORY_CARE_PROVIDER_SITE_OTHER): Payer: Medicare Other

## 2017-07-25 ENCOUNTER — Other Ambulatory Visit: Payer: Self-pay

## 2017-07-25 ENCOUNTER — Ambulatory Visit (INDEPENDENT_AMBULATORY_CARE_PROVIDER_SITE_OTHER): Payer: Medicare Other | Admitting: Family Medicine

## 2017-07-25 ENCOUNTER — Telehealth: Payer: Self-pay | Admitting: Family Medicine

## 2017-07-25 ENCOUNTER — Encounter: Payer: Self-pay | Admitting: Family Medicine

## 2017-07-25 VITALS — BP 100/60 | HR 60 | Temp 98.2°F | Wt 204.0 lb

## 2017-07-25 DIAGNOSIS — E1140 Type 2 diabetes mellitus with diabetic neuropathy, unspecified: Secondary | ICD-10-CM

## 2017-07-25 DIAGNOSIS — M542 Cervicalgia: Secondary | ICD-10-CM

## 2017-07-25 DIAGNOSIS — G8929 Other chronic pain: Secondary | ICD-10-CM

## 2017-07-25 DIAGNOSIS — I2510 Atherosclerotic heart disease of native coronary artery without angina pectoris: Secondary | ICD-10-CM

## 2017-07-25 DIAGNOSIS — E039 Hypothyroidism, unspecified: Secondary | ICD-10-CM | POA: Diagnosis not present

## 2017-07-25 DIAGNOSIS — M50323 Other cervical disc degeneration at C6-C7 level: Secondary | ICD-10-CM | POA: Diagnosis not present

## 2017-07-25 NOTE — Assessment & Plan Note (Signed)
Request recent labs from dialysis as she reports TSH was checked.  Continue Synthroid.

## 2017-07-25 NOTE — Telephone Encounter (Signed)
Please let the patient know that we received her lab results.  It does not appear that she had a TSH drawn anytime recently.  I also do not see an A1c having been drawn on the labs.  I would like to have her come back in to have those done.  I have placed orders.  Thanks.

## 2017-07-25 NOTE — Assessment & Plan Note (Signed)
Chronic issue.  Benign exam.  Likely related to arthritis.  Will obtain an x-ray.  Possibly refer to physical therapy.

## 2017-07-25 NOTE — Patient Instructions (Signed)
Nice to see you. We will get an x-ray of your neck and then determine the next step in management. We will request records for your recent labs. Please monitor your neuropathy and if it starts to bother you more please let us know.

## 2017-07-25 NOTE — Telephone Encounter (Signed)
Left message to return call, ok for pec to speak to patient and schedule non fasting lab **please also give xray results from 07/25/17

## 2017-07-25 NOTE — Progress Notes (Signed)
[name], MD Phone: [phone]  Jamie Arias is a 62 y.o. female who presents today for f/u.  CC: neck pain, dm, hypothyroid  DIABETES Disease Monitoring: Blood Sugar ranges-90s-120s Polyuria/phagia/dipsia- no      Optho- UTd Medications: Compliance- taking basaglar 27 u daily Hypoglycemic symptoms- no She notes occasionally her neuropathy in her feet will bother her with burning in her toes.  Some nights it will keep her up though she will go weeks without having symptoms.  She reports chronic neck pain at the base of her neck following a fall 2 years ago.  [address] and hit the top of her neck at that time.  She fell about a year ago and hit the bottom of her head on a kitchen chair.  She had no injury from that.  She has had no residual issues from that fall.  She notes the bottom of her neck will hurt if she bends it forward.  Occurs about once a month.  No radiation.  No numbness or weakness.  She has used [employer] with some benefit.  HYPOTHYROIDISM Disease Monitoring Weight changes: no  Skin Changes: no Heat/Cold intolerance: no  Medication Monitoring Compliance:  Taking synthroid   Last TSH:   Lab Results  Component Value Date   TSH 1.36 07/25/2016       Social History   Tobacco Use  Smoking Status Never Smoker  Smokeless Tobacco Never Used     ROS see history of present illness  Objective  Physical Exam Vitals:   07/25/17 0804  BP: 100/60  Pulse: 60  Temp: 98.2 F (36.8 C)  SpO2: 95%    BP Readings from Last 3 Encounters:  07/25/17 100/60  07/16/17 118/62  07/01/17 117/73   Wt Readings from Last 3 Encounters:  07/25/17 204 lb (92.5 kg)  07/16/17 203 lb 8 oz (92.3 kg)  07/01/17 211 lb 8 oz (95.9 kg)    Physical Exam  Constitutional: No distress.  Cardiovascular: Normal rate, regular rhythm and normal heart sounds.  Pulmonary/Chest: Effort normal and breath sounds normal.  Musculoskeletal: She exhibits no edema.  No midline neck  tenderness, no midline neck step-off, no muscular neck tenderness, no trapezius tenderness  Neurological: She is alert.  5/5 strength in bilateral biceps, triceps, grip, quads, hamstrings, plantar and dorsiflexion, sensation to light touch intact in bilateral UE and LE  Skin: Skin is warm and dry. She is not diaphoretic.   Diabetic Foot Exam - Simple   Simple Foot Form Diabetic Foot exam was performed with the following findings:  Yes 07/25/2017  8:34 AM  Visual Inspection No deformities, no ulcerations, no other skin breakdown bilaterally:  Yes Sensation Testing See comments:  Yes Pulse Check Posterior Tibialis and Dorsalis pulse intact bilaterally:  Yes Comments Slight decreased monofilament testing over bilateral little toes, otherwise intact light touch and monofilament testing      Assessment/Plan: Please see individual problem list.  Chronic neck pain Chronic issue.  Benign exam.  Likely related to arthritis.  Will obtain an x-ray.  Possibly refer to physical therapy.  Hypothyroid Request recent labs from dialysis as she reports TSH was checked.  Continue Synthroid.  Diabetes mellitus, type 2 ([place]) Request recent labs from dialysis.  Continue current regimen.  She will monitor her neuropathy and if it starts to occur more frequently let us know.    Health Maintenance: Patient declines mammogram and colonoscopy.  Discussed the risk of not having screening.  Orders Placed This Encounter  Procedures  . DG Cervical Spine Complete    Standing Status:   Future    Number of Occurrences:   1    Standing Expiration Date:   09/25/2018    Order Specific Question:   Reason for Exam (SYMPTOM  OR DIAGNOSIS REQUIRED)    Answer:   neck pain over the past year, lower cervical spine    Order Specific Question:   Preferred imaging location?    Answer:   [facility] Specific Question:   Radiology Contrast Protocol - do NOT remove file path    Answer:    [email or web address]    No orders of the defined types were placed in this encounter.    [name], MD [place]

## 2017-07-25 NOTE — Assessment & Plan Note (Signed)
Request recent labs from dialysis.  Continue current regimen.  She will monitor her neuropathy and if it starts to occur more frequently let us know.

## 2017-07-26 ENCOUNTER — Telehealth: Payer: Self-pay | Admitting: Family Medicine

## 2017-07-26 DIAGNOSIS — N186 End stage renal disease: Secondary | ICD-10-CM | POA: Diagnosis not present

## 2017-07-26 DIAGNOSIS — D631 Anemia in chronic kidney disease: Secondary | ICD-10-CM | POA: Diagnosis not present

## 2017-07-26 DIAGNOSIS — E1122 Type 2 diabetes mellitus with diabetic chronic kidney disease: Secondary | ICD-10-CM | POA: Diagnosis not present

## 2017-07-26 DIAGNOSIS — N2581 Secondary hyperparathyroidism of renal origin: Secondary | ICD-10-CM | POA: Diagnosis not present

## 2017-07-26 NOTE — Telephone Encounter (Signed)
Left message for pt's husband to return call to the office for lab results.

## 2017-07-26 NOTE — Telephone Encounter (Signed)
Pt's husband calling back to get pt's results. He is on [facility] CB# [phone]

## 2017-07-26 NOTE — Telephone Encounter (Signed)
Copied from [place] [phone]. Topic: Quick Communication - Lab Results >> Jul 25, 2017  2:10 PM Juanda Chance, [place] wrote: Left message to return call, ok for pec to speak to patient about lab results and also telephone encounter from 07/25/17 and schedule non fasting lab   [phone]

## 2017-07-26 NOTE — Telephone Encounter (Signed)
Duplicate encounter

## 2017-07-26 NOTE — Telephone Encounter (Signed)
Attempted to return call to husband @ [phone]; left vm. To return call.

## 2017-07-29 DIAGNOSIS — N186 End stage renal disease: Secondary | ICD-10-CM | POA: Diagnosis not present

## 2017-07-29 DIAGNOSIS — D631 Anemia in chronic kidney disease: Secondary | ICD-10-CM | POA: Diagnosis not present

## 2017-07-29 DIAGNOSIS — N2581 Secondary hyperparathyroidism of renal origin: Secondary | ICD-10-CM | POA: Diagnosis not present

## 2017-07-29 DIAGNOSIS — E1122 Type 2 diabetes mellitus with diabetic chronic kidney disease: Secondary | ICD-10-CM | POA: Diagnosis not present

## 2017-07-30 ENCOUNTER — Other Ambulatory Visit: Payer: Medicare Other

## 2017-07-31 DIAGNOSIS — D631 Anemia in chronic kidney disease: Secondary | ICD-10-CM | POA: Diagnosis not present

## 2017-07-31 DIAGNOSIS — N2581 Secondary hyperparathyroidism of renal origin: Secondary | ICD-10-CM | POA: Diagnosis not present

## 2017-07-31 DIAGNOSIS — E039 Hypothyroidism, unspecified: Secondary | ICD-10-CM | POA: Diagnosis not present

## 2017-07-31 DIAGNOSIS — E1122 Type 2 diabetes mellitus with diabetic chronic kidney disease: Secondary | ICD-10-CM | POA: Diagnosis not present

## 2017-07-31 DIAGNOSIS — N186 End stage renal disease: Secondary | ICD-10-CM | POA: Diagnosis not present

## 2017-07-31 NOTE — Telephone Encounter (Signed)
Sent mychart message and scheduled for same day as patients appointment with pharmacist

## 2017-08-01 ENCOUNTER — Telehealth: Payer: Self-pay | Admitting: Family Medicine

## 2017-08-01 NOTE — Telephone Encounter (Signed)
Opened in error

## 2017-08-02 DIAGNOSIS — E1122 Type 2 diabetes mellitus with diabetic chronic kidney disease: Secondary | ICD-10-CM | POA: Diagnosis not present

## 2017-08-02 DIAGNOSIS — Z992 Dependence on renal dialysis: Secondary | ICD-10-CM | POA: Diagnosis not present

## 2017-08-02 DIAGNOSIS — N186 End stage renal disease: Secondary | ICD-10-CM | POA: Diagnosis not present

## 2017-08-02 DIAGNOSIS — D631 Anemia in chronic kidney disease: Secondary | ICD-10-CM | POA: Diagnosis not present

## 2017-08-02 DIAGNOSIS — N2581 Secondary hyperparathyroidism of renal origin: Secondary | ICD-10-CM | POA: Diagnosis not present

## 2017-08-04 ENCOUNTER — Other Ambulatory Visit: Payer: Self-pay | Admitting: Family Medicine

## 2017-08-05 DIAGNOSIS — N2581 Secondary hyperparathyroidism of renal origin: Secondary | ICD-10-CM | POA: Diagnosis not present

## 2017-08-05 DIAGNOSIS — D631 Anemia in chronic kidney disease: Secondary | ICD-10-CM | POA: Diagnosis not present

## 2017-08-05 DIAGNOSIS — E1122 Type 2 diabetes mellitus with diabetic chronic kidney disease: Secondary | ICD-10-CM | POA: Diagnosis not present

## 2017-08-05 DIAGNOSIS — N186 End stage renal disease: Secondary | ICD-10-CM | POA: Diagnosis not present

## 2017-08-05 DIAGNOSIS — D509 Iron deficiency anemia, unspecified: Secondary | ICD-10-CM | POA: Diagnosis not present

## 2017-08-07 DIAGNOSIS — D631 Anemia in chronic kidney disease: Secondary | ICD-10-CM | POA: Diagnosis not present

## 2017-08-07 DIAGNOSIS — N186 End stage renal disease: Secondary | ICD-10-CM | POA: Diagnosis not present

## 2017-08-07 DIAGNOSIS — D509 Iron deficiency anemia, unspecified: Secondary | ICD-10-CM | POA: Diagnosis not present

## 2017-08-07 DIAGNOSIS — E1122 Type 2 diabetes mellitus with diabetic chronic kidney disease: Secondary | ICD-10-CM | POA: Diagnosis not present

## 2017-08-07 DIAGNOSIS — N2581 Secondary hyperparathyroidism of renal origin: Secondary | ICD-10-CM | POA: Diagnosis not present

## 2017-08-12 DIAGNOSIS — D509 Iron deficiency anemia, unspecified: Secondary | ICD-10-CM | POA: Diagnosis not present

## 2017-08-12 DIAGNOSIS — E1122 Type 2 diabetes mellitus with diabetic chronic kidney disease: Secondary | ICD-10-CM | POA: Diagnosis not present

## 2017-08-12 DIAGNOSIS — N2581 Secondary hyperparathyroidism of renal origin: Secondary | ICD-10-CM | POA: Diagnosis not present

## 2017-08-12 DIAGNOSIS — D631 Anemia in chronic kidney disease: Secondary | ICD-10-CM | POA: Diagnosis not present

## 2017-08-12 DIAGNOSIS — N186 End stage renal disease: Secondary | ICD-10-CM | POA: Diagnosis not present

## 2017-08-13 DIAGNOSIS — N2581 Secondary hyperparathyroidism of renal origin: Secondary | ICD-10-CM | POA: Diagnosis not present

## 2017-08-13 DIAGNOSIS — N186 End stage renal disease: Secondary | ICD-10-CM | POA: Diagnosis not present

## 2017-08-13 DIAGNOSIS — E8770 Fluid overload, unspecified: Secondary | ICD-10-CM | POA: Diagnosis not present

## 2017-08-14 DIAGNOSIS — D631 Anemia in chronic kidney disease: Secondary | ICD-10-CM | POA: Diagnosis not present

## 2017-08-14 DIAGNOSIS — E1122 Type 2 diabetes mellitus with diabetic chronic kidney disease: Secondary | ICD-10-CM | POA: Diagnosis not present

## 2017-08-14 DIAGNOSIS — D509 Iron deficiency anemia, unspecified: Secondary | ICD-10-CM | POA: Diagnosis not present

## 2017-08-14 DIAGNOSIS — N2581 Secondary hyperparathyroidism of renal origin: Secondary | ICD-10-CM | POA: Diagnosis not present

## 2017-08-14 DIAGNOSIS — N186 End stage renal disease: Secondary | ICD-10-CM | POA: Diagnosis not present

## 2017-08-16 DIAGNOSIS — N2581 Secondary hyperparathyroidism of renal origin: Secondary | ICD-10-CM | POA: Diagnosis not present

## 2017-08-16 DIAGNOSIS — N186 End stage renal disease: Secondary | ICD-10-CM | POA: Diagnosis not present

## 2017-08-16 DIAGNOSIS — D631 Anemia in chronic kidney disease: Secondary | ICD-10-CM | POA: Diagnosis not present

## 2017-08-16 DIAGNOSIS — E1122 Type 2 diabetes mellitus with diabetic chronic kidney disease: Secondary | ICD-10-CM | POA: Diagnosis not present

## 2017-08-16 DIAGNOSIS — D509 Iron deficiency anemia, unspecified: Secondary | ICD-10-CM | POA: Diagnosis not present

## 2017-08-16 LAB — HEMOGLOBIN A1C: Hemoglobin A1C: 7.1

## 2017-08-19 ENCOUNTER — Ambulatory Visit (INDEPENDENT_AMBULATORY_CARE_PROVIDER_SITE_OTHER): Payer: Medicare Other | Admitting: Pharmacist

## 2017-08-19 ENCOUNTER — Encounter: Payer: Self-pay | Admitting: Pharmacist

## 2017-08-19 ENCOUNTER — Other Ambulatory Visit (INDEPENDENT_AMBULATORY_CARE_PROVIDER_SITE_OTHER): Payer: Medicare Other

## 2017-08-19 VITALS — BP 133/85 | HR 54 | Ht 64.0 in | Wt 209.0 lb

## 2017-08-19 DIAGNOSIS — E1122 Type 2 diabetes mellitus with diabetic chronic kidney disease: Secondary | ICD-10-CM | POA: Diagnosis not present

## 2017-08-19 DIAGNOSIS — I2510 Atherosclerotic heart disease of native coronary artery without angina pectoris: Secondary | ICD-10-CM

## 2017-08-19 DIAGNOSIS — E039 Hypothyroidism, unspecified: Secondary | ICD-10-CM

## 2017-08-19 DIAGNOSIS — N186 End stage renal disease: Secondary | ICD-10-CM | POA: Diagnosis not present

## 2017-08-19 DIAGNOSIS — D509 Iron deficiency anemia, unspecified: Secondary | ICD-10-CM | POA: Diagnosis not present

## 2017-08-19 DIAGNOSIS — N2581 Secondary hyperparathyroidism of renal origin: Secondary | ICD-10-CM | POA: Diagnosis not present

## 2017-08-19 DIAGNOSIS — D631 Anemia in chronic kidney disease: Secondary | ICD-10-CM | POA: Diagnosis not present

## 2017-08-19 DIAGNOSIS — E1140 Type 2 diabetes mellitus with diabetic neuropathy, unspecified: Secondary | ICD-10-CM

## 2017-08-19 LAB — TSH: TSH: 1.8 u[IU]/mL (ref 0.35–4.50)

## 2017-08-19 NOTE — Patient Instructions (Signed)
Great to see you! I think you have done a great job! No need to continue following with me. If your blood sugars get in the 200s often or you are having several low numbers, give [place] a call and we will get you set up with the new pharmacist, Catie.   Otherwise, continue your insulin - Basaglar 28 units daily.

## 2017-08-19 NOTE — Progress Notes (Signed)
    S:     Chief Complaint  Patient presents with  . Medication Management    Diabetes    Patient arrives in good spirits, ambulating without assistance, accompanied by husband.  Presents for diabetes evaluation, education, and management at the request of Dr. Caryl Bis (referred on 04/08/17). Last seen by primary care provider on 07/25/17.   Patient brings in CBG log for review. Reports she has been doing well and states she got her A1C drawn at [facility] last week. Patient reports her A1C was 7.1 on 6/14. Patient denies s/sx of hypoglycemia. Also reports that her midodrine was increased to 10 mg BID.   Insurance coverage/medication affordability: insulin costs $8, all other meds also affordable. Likely that patient has some type of LIS/Extra help.   Patient reports adherence with medications.  Current diabetes medications include: basaglar 28 units daily  Patient denies hypoglycemic events.  O:  Physical Exam  Constitutional: She appears well-developed and well-nourished.     Review of Systems  All other systems reviewed and are negative.    Lab Results  Component Value Date   HGBA1C 7.1 08/16/2017   Vitals:   08/19/17 0809  BP: 133/85  Pulse: (!) 54    Lipid Panel     Component Value Date/Time   CHOL 172 07/16/2017 1500   TRIG 170 (H) 07/16/2017 1500   HDL 47 07/16/2017 1500   CHOLHDL 3.7 07/16/2017 1500   CHOLHDL 3.9 10/24/2015 0415   VLDL 20 10/24/2015 0415   LDLCALC 91 07/16/2017 1500   LDLDIRECT 126.9 10/31/2012 0801    Home fasting CBG: mid-100s, excursions to 90, low-200s,  275 this morning (after ice cream)  A/P: #Diabetes longstanding currently reasonably controlled as evidenced by A1C of 7.1. Patient denies hypoglycemic events and is able to verbalize appropriate hypoglycemia management plan. Patient reports adherence with medication.  - Continue Basaglar 28 units daily - Discussed reasons for re-referral to pharmacy clinic.  - Counseled on s/sx and  treatment for hypoglycemia.   #ASCVD -secondaryprevention in patient aged 38-75 with DM, baseline LDL 70-189, stable ASCVD - high intensity statin indicated. LDL >70 on high-intensity statin with recent addition of ezetimibe.  -ContinuedAspirin 81mg   -Continuedatorvastatin80mg  and ezetimibe 10 mg daily  Written patient instructions provided.  Total time in face to face counseling 20 minutes.    Follow up with primary care provider.   Carlean Jews, Pharm.D., BCPS, CPP PGY2 Ambulatory Care Pharmacy Resident Phone: [phone]

## 2017-08-19 NOTE — Assessment & Plan Note (Signed)
#  Diabetes longstanding currently reasonably controlled as evidenced by A1C of 7.1. Patient denies hypoglycemic events and is able to verbalize appropriate hypoglycemia management plan. Patient reports adherence with medication.  - Continue Basaglar 28 units daily - Discussed reasons for re-referral to pharmacy clinic.  - Counseled on s/sx and treatment for hypoglycemia.

## 2017-08-19 NOTE — Assessment & Plan Note (Signed)
#  ASCVD -secondaryprevention in patient aged 64-75 with DM, baseline LDL 70-189, stable ASCVD - high intensity statin indicated. LDL >70 on high-intensity statin with recent addition of ezetimibe.  -ContinuedAspirin 81mg   -Continuedatorvastatin80mg  and ezetimibe 10 mg daily

## 2017-08-19 NOTE — Addendum Note (Signed)
Addended by: Leeanne Rio on: 08/19/2017 08:46 AM   Modules accepted: Orders

## 2017-08-20 ENCOUNTER — Encounter: Payer: Self-pay | Admitting: Family Medicine

## 2017-08-20 ENCOUNTER — Ambulatory Visit: Payer: Medicare Other | Admitting: Family Medicine

## 2017-08-21 DIAGNOSIS — N186 End stage renal disease: Secondary | ICD-10-CM | POA: Diagnosis not present

## 2017-08-21 DIAGNOSIS — E1122 Type 2 diabetes mellitus with diabetic chronic kidney disease: Secondary | ICD-10-CM | POA: Diagnosis not present

## 2017-08-21 DIAGNOSIS — D509 Iron deficiency anemia, unspecified: Secondary | ICD-10-CM | POA: Diagnosis not present

## 2017-08-21 DIAGNOSIS — D631 Anemia in chronic kidney disease: Secondary | ICD-10-CM | POA: Diagnosis not present

## 2017-08-21 DIAGNOSIS — N2581 Secondary hyperparathyroidism of renal origin: Secondary | ICD-10-CM | POA: Diagnosis not present

## 2017-08-21 NOTE — Progress Notes (Signed)
I have reviewed the above note and agree. I was available to the pharmacist for consultation.  Eric Sonnenberg, MD  

## 2017-08-23 DIAGNOSIS — D509 Iron deficiency anemia, unspecified: Secondary | ICD-10-CM | POA: Diagnosis not present

## 2017-08-23 DIAGNOSIS — D631 Anemia in chronic kidney disease: Secondary | ICD-10-CM | POA: Diagnosis not present

## 2017-08-23 DIAGNOSIS — N186 End stage renal disease: Secondary | ICD-10-CM | POA: Diagnosis not present

## 2017-08-23 DIAGNOSIS — E1122 Type 2 diabetes mellitus with diabetic chronic kidney disease: Secondary | ICD-10-CM | POA: Diagnosis not present

## 2017-08-23 DIAGNOSIS — N2581 Secondary hyperparathyroidism of renal origin: Secondary | ICD-10-CM | POA: Diagnosis not present

## 2017-08-27 ENCOUNTER — Encounter: Payer: Self-pay | Admitting: Family Medicine

## 2017-08-27 ENCOUNTER — Other Ambulatory Visit: Admission: RE | Admit: 2017-08-27 | Payer: Medicare Other | Source: Ambulatory Visit | Admitting: *Deleted

## 2017-08-27 ENCOUNTER — Ambulatory Visit (INDEPENDENT_AMBULATORY_CARE_PROVIDER_SITE_OTHER): Payer: Medicare Other | Admitting: Family Medicine

## 2017-08-27 VITALS — BP 110/60 | HR 68 | Temp 98.7°F

## 2017-08-27 DIAGNOSIS — Z992 Dependence on renal dialysis: Secondary | ICD-10-CM

## 2017-08-27 DIAGNOSIS — N186 End stage renal disease: Secondary | ICD-10-CM

## 2017-08-27 DIAGNOSIS — I2510 Atherosclerotic heart disease of native coronary artery without angina pectoris: Secondary | ICD-10-CM | POA: Diagnosis not present

## 2017-08-27 DIAGNOSIS — R42 Dizziness and giddiness: Secondary | ICD-10-CM | POA: Diagnosis not present

## 2017-08-27 NOTE — Progress Notes (Signed)
[name], MD Phone: [phone]  Jamie Arias is a 62 y.o. female who presents today for same day visit.  CC: vertigo  Patient reports intermittent vertigo symptoms over the last 2 weeks.  She notes 3 weeks ago she was on a plane and felt her ears were popping.  2 weeks ago developed intermittent issues with the room spinning around her.  She did fall on one occasion and scraped her right knee.  It is healing well with a good scab.  She has had some tinnitus and ear fullness bilaterally.  Mostly happens when she changes positions to stand up or when she turns her head in bed.  It is not constant.  She overall feels weak.  She has not eaten very much.  She notes some nausea related to this.  No shortness of breath.  She missed dialysis yesterday.   Social History   Tobacco Use  Smoking Status Never Smoker  Smokeless Tobacco Never Used     ROS see history of present illness  Objective  Physical Exam Vitals:   08/27/17 1617  BP: 110/60  Pulse: 68  Temp: 98.7 F (37.1 C)  SpO2: 98%    BP Readings from Last 3 Encounters:  08/27/17 110/60  08/19/17 133/85  07/25/17 100/60   Wt Readings from Last 3 Encounters:  08/19/17 209 lb (94.8 kg)  07/25/17 204 lb (92.5 kg)  07/16/17 203 lb 8 oz (92.3 kg)    Physical Exam  Constitutional: No distress.  HENT:  TMs bilaterally normal, initially the right TM was obscured by cerumen that this was irrigated by CMA revealing a normal TM, patient tolerated this well  Cardiovascular: Normal rate, regular rhythm and normal heart sounds.  Pulmonary/Chest: Effort normal and breath sounds normal.  Musculoskeletal: She exhibits no edema.  Neurological: She is alert.  CN 2-12 intact, 5/5 strength in bilateral biceps, triceps, grip, quads, hamstrings, plantar and dorsiflexion, sensation to light touch intact in bilateral UE and LE, slow gait related to patient being worried about becoming dizzy, patient was unable to get onto the exam  table to complete Dix-Hallpike testing, rapid alternating movements normal  Skin: Skin is warm and dry. She is not diaphoretic.     Assessment/Plan: Please see individual problem list.  Vertigo Patient with several week history of vertiginous symptoms.  Likely peripheral related.  Could be Mnire's versus BPPV.  On review the typical medications that I give for this such as antihistamines, antiemetics, and benzodiazepines seem to have warnings in up-to-date about giving them to people with end-stage renal disease.  I will message our clinical pharmacist to get her input on the best options given the patient's renal function.  We will refer to ENT.  She is given return precautions.  ESRD on dialysis [facility]) Patient missed a session of dialysis.  Does seem somewhat weak on exam.  She reports not eating well.  I discussed having her go to the lab at the hospital given the time of day so that we could quickly get her labs back.  She and her husband will go to have these done.  I will contact her tonight if there is any need for her to be evaluated based on her labs.  Addendum: Around 5:28 PM I noted that the patient's lab test had been canceled by the computer system.  I attempted to contact the patient to see if she had gone to have this completed.  There was no answer.  I placed a new  order in case the patient was still at the lab at the hospital.  I again contacted the patient at 6:20 PM and she answered.  She noted that they apparently did not have an order so could not draw her blood.  She was now at home.  I discussed that the options at this point would be going back to the lab to have blood drawn versus waiting until dialysis tomorrow.  The other option would be if she started to feel poorly or worsened overnight going to the emergency department.  I discussed my concern that her levels could potentially be off given that she missed dialysis yesterday and that could be contributing to some of her  symptoms.  I recommended having labs done today to determine if she needed evaluation today.  The patient deferred this and opted to wait until dialysis tomorrow.  I encouraged her to be evaluated if she felt poorly overnight.  Orders Placed This Encounter  Procedures  . Comp Met (CMET)    Standing Status:   Future    Standing Expiration Date:   08/28/2018  . Ambulatory referral to ENT    Referral Priority:   Routine    Referral Type:   Consultation    Referral Reason:   Specialty Services Required    Requested Specialty:   Otolaryngology    Number of Visits Requested:   1    No orders of the defined types were placed in this encounter.    [name], MD [place]

## 2017-08-27 NOTE — Assessment & Plan Note (Signed)
Patient missed a session of dialysis.  Does seem somewhat weak on exam.  She reports not eating well.  I discussed having her go to the lab at the hospital given the time of day so that we could quickly get her labs back.  She and her husband will go to have these done.  I will contact her tonight if there is any need for her to be evaluated based on her labs.

## 2017-08-27 NOTE — Patient Instructions (Signed)
Nice to see you. We can refer you to ENT. Please go to the lab at the [facility] mall at the hospital and have your labs drawn. We will check with our clinical pharmacist regarding options for vertigo given your kidney disease.

## 2017-08-27 NOTE — Assessment & Plan Note (Signed)
Patient with several week history of vertiginous symptoms.  Likely peripheral related.  Could be Mnire's versus BPPV.  On review the typical medications that I give for this such as antihistamines, antiemetics, and benzodiazepines seem to have warnings in up-to-date about giving them to people with end-stage renal disease.  I will message our clinical pharmacist to get her input on the best options given the patient's renal function.  We will refer to ENT.  She is given return precautions.

## 2017-08-28 ENCOUNTER — Telehealth: Payer: Self-pay

## 2017-08-28 ENCOUNTER — Other Ambulatory Visit: Payer: Self-pay

## 2017-08-28 ENCOUNTER — Emergency Department
Admission: EM | Admit: 2017-08-28 | Discharge: 2017-08-28 | Disposition: A | Discharge status: Home / Self Care | Payer: Medicare Other | Attending: Student in an Organized Health Care Education/Training Program | Admitting: Student in an Organized Health Care Education/Training Program

## 2017-08-28 ENCOUNTER — Emergency Department: Payer: Medicare Other

## 2017-08-28 DIAGNOSIS — E1122 Type 2 diabetes mellitus with diabetic chronic kidney disease: Secondary | ICD-10-CM | POA: Insufficient documentation

## 2017-08-28 DIAGNOSIS — I2510 Atherosclerotic heart disease of native coronary artery without angina pectoris: Secondary | ICD-10-CM | POA: Diagnosis not present

## 2017-08-28 DIAGNOSIS — N186 End stage renal disease: Secondary | ICD-10-CM | POA: Diagnosis not present

## 2017-08-28 DIAGNOSIS — N2581 Secondary hyperparathyroidism of renal origin: Secondary | ICD-10-CM | POA: Diagnosis not present

## 2017-08-28 DIAGNOSIS — E039 Hypothyroidism, unspecified: Secondary | ICD-10-CM | POA: Insufficient documentation

## 2017-08-28 DIAGNOSIS — Z7982 Long term (current) use of aspirin: Secondary | ICD-10-CM | POA: Diagnosis not present

## 2017-08-28 DIAGNOSIS — Z79899 Other long term (current) drug therapy: Secondary | ICD-10-CM | POA: Diagnosis not present

## 2017-08-28 DIAGNOSIS — I120 Hypertensive chronic kidney disease with stage 5 chronic kidney disease or end stage renal disease: Secondary | ICD-10-CM | POA: Diagnosis not present

## 2017-08-28 DIAGNOSIS — D509 Iron deficiency anemia, unspecified: Secondary | ICD-10-CM | POA: Diagnosis not present

## 2017-08-28 DIAGNOSIS — Z794 Long term (current) use of insulin: Secondary | ICD-10-CM | POA: Diagnosis not present

## 2017-08-28 DIAGNOSIS — J9811 Atelectasis: Secondary | ICD-10-CM | POA: Diagnosis not present

## 2017-08-28 DIAGNOSIS — Z992 Dependence on renal dialysis: Secondary | ICD-10-CM | POA: Insufficient documentation

## 2017-08-28 DIAGNOSIS — I132 Hypertensive heart and chronic kidney disease with heart failure and with stage 5 chronic kidney disease, or end stage renal disease: Secondary | ICD-10-CM | POA: Diagnosis not present

## 2017-08-28 DIAGNOSIS — I5032 Chronic diastolic (congestive) heart failure: Secondary | ICD-10-CM | POA: Insufficient documentation

## 2017-08-28 DIAGNOSIS — R51 Headache: Secondary | ICD-10-CM | POA: Diagnosis not present

## 2017-08-28 DIAGNOSIS — E1140 Type 2 diabetes mellitus with diabetic neuropathy, unspecified: Secondary | ICD-10-CM | POA: Insufficient documentation

## 2017-08-28 DIAGNOSIS — Z951 Presence of aortocoronary bypass graft: Secondary | ICD-10-CM | POA: Diagnosis not present

## 2017-08-28 DIAGNOSIS — R42 Dizziness and giddiness: Secondary | ICD-10-CM

## 2017-08-28 DIAGNOSIS — D631 Anemia in chronic kidney disease: Secondary | ICD-10-CM | POA: Diagnosis not present

## 2017-08-28 LAB — CBC WITH DIFFERENTIAL/PLATELET
Basophils Absolute: 0.1 10*3/uL (ref 0–0.1)
Basophils Relative: 1 %
Eosinophils Absolute: 0 10*3/uL (ref 0–0.7)
Eosinophils Relative: 0 %
HCT: 38.2 % (ref 35.0–47.0)
Hemoglobin: 12.9 g/dL (ref 12.0–16.0)
Lymphocytes Relative: 7 %
Lymphs Abs: 0.4 10*3/uL — ABNORMAL LOW (ref 1.0–3.6)
MCH: 32 pg (ref 26.0–34.0)
MCHC: 33.7 g/dL (ref 32.0–36.0)
MCV: 94.9 fL (ref 80.0–100.0)
Monocytes Absolute: 0.4 10*3/uL (ref 0.2–0.9)
Monocytes Relative: 5 %
Neutro Abs: 5.8 10*3/uL (ref 1.4–6.5)
Neutrophils Relative %: 87 %
Platelets: 148 10*3/uL — ABNORMAL LOW (ref 150–440)
RBC: 4.02 MIL/uL (ref 3.80–5.20)
RDW: 16.8 % — ABNORMAL HIGH (ref 11.5–14.5)
WBC: 6.7 10*3/uL (ref 3.6–11.0)

## 2017-08-28 LAB — BASIC METABOLIC PANEL
Anion gap: 19 — ABNORMAL HIGH (ref 5–15)
BUN: 22 mg/dL (ref 8–23)
CO2: 24 mmol/L (ref 22–32)
Calcium: 9 mg/dL (ref 8.9–10.3)
Chloride: 93 mmol/L — ABNORMAL LOW (ref 98–111)
Creatinine, Ser: 5.37 mg/dL — ABNORMAL HIGH (ref 0.44–1.00)
GFR calc Af Amer: 9 mL/min — ABNORMAL LOW (ref >60)
GFR calc non Af Amer: 8 mL/min — ABNORMAL LOW (ref >60)
Glucose, Bld: 94 mg/dL (ref 70–99)
Potassium: 3.6 mmol/L (ref 3.5–5.1)
Sodium: 136 mmol/L (ref 135–145)

## 2017-08-28 MED ORDER — SODIUM CHLORIDE 0.9 % IV BOLUS
250.0000 mL | Freq: Once | INTRAVENOUS | Status: AC
  Administered 2017-08-28: 250 mL via INTRAVENOUS

## 2017-08-28 MED ORDER — MECLIZINE HCL 25 MG PO TABS
12.5000 mg | ORAL_TABLET | Freq: Once | ORAL | Status: AC
  Administered 2017-08-28: 12.5 mg via ORAL
  Filled 2017-08-28: qty 1

## 2017-08-28 MED ORDER — MECLIZINE HCL 12.5 MG PO TABS: 12.5000 mg | ORAL_TABLET | Freq: Three times a day (TID) | ORAL | 0 refills | Status: DC | PRN

## 2017-08-28 NOTE — Telephone Encounter (Signed)
Copied from [place] [id number]. Topic: General - Other >> Aug 28, 2017 11:16 AM Marin Olp L wrote: Reason for CRM: Patient's husband calling to find out why they haven't heard back from Dr. Caryl Bis about finding a diabetic medicine for the patient who is also on dialysis? They would like a call back from his cma at earliest convenience.

## 2017-08-28 NOTE — Discharge Instructions (Addendum)
Please seek medical attention for any high fevers, chest pain, shortness of breath, change in behavior, persistent vomiting, bloody stool or any other new or concerning symptoms.  

## 2017-08-28 NOTE — ED Notes (Signed)
Pt received full treatment of dialysis today. Still accessed on R fistula in R arm.

## 2017-08-28 NOTE — Telephone Encounter (Signed)
-----   Message from Carlean Jews, [facility] sent at 08/28/2017 10:42 AM EDT ----- Hi,  Meclizine, hydroxyzine, ondansetron are all hepatically metabolized and cleared - would be OK using these agents in renal disease. Valium is hepatically metabolized but does rely on renal clearance so, while it technically doesn't have dose adjustments for ESRD or renal disease, I would try the others first.   Chrys Racer  ----- Message ----- From: [name], MD Sent: 08/27/2017   6:49 PM To: Carlean Jews, [place],   I saw [name] for vertigo. I was reviewing my typical medications to treat vertigo, meclizine, valium, zofran, in uptodate and they all had a use caution warning for patients with ESRD. I wanted to get your input on these for use in her given that she is on dialysis. Thanks for your help.   Randall Hiss

## 2017-08-28 NOTE — Telephone Encounter (Signed)
Copied from [place] [id number]. Topic: General - Other >> Aug 28, 2017 11:16 AM Marin Olp L wrote: Reason for CRM: Patient's husband calling to find out why they haven't heard back from Dr. Caryl Bis about finding a diabetic medicine for the patient who is also on dialysis? They would like a call back from his cma at earliest convenience.

## 2017-08-28 NOTE — Telephone Encounter (Signed)
I just received the message back from [place] regarding what the options for her vertigo are. I sent meclizine to her pharmacy. Please check to make sure she went to dialysis today. Thanks.

## 2017-08-28 NOTE — Telephone Encounter (Signed)
Patients husband notified. He states she did go to dialysis but they sent her to the ER. Patient is currently in the ER

## 2017-08-28 NOTE — Telephone Encounter (Signed)
mychart message sent from patients husband:  This message is for my wife Jamie Arias. She was seen by Dr. Caryl Bis on the 25th of June for inner ear and he was supposed to get back with her about what kind of medicine he was going to prescribe her. She has not heard anything back from Dr. Caryl Bis.

## 2017-08-28 NOTE — ED Provider Notes (Signed)
Patient states that she feels better after medication and fluids.  I did discussion with the patient about urine.  We did miss a urine sample.  At this point she felt comfortable going home.  I think this is reasonable given that she is feeling better.  Did however tell patient that if she started having any dysuria bad odor or change in urination she should get her urine analyzed.   Nance Pear, MD 08/28/17 [phone]

## 2017-08-28 NOTE — Telephone Encounter (Signed)
See other message

## 2017-08-28 NOTE — ED Triage Notes (Signed)
Pt arrives [facility] from dialysis for nausea (denies vomiting), dizzy, weakness, HA today. States symptoms x few days. Alert, oriented, still accessed R arm from dialysis. Xray at bedside.

## 2017-08-28 NOTE — ED Provider Notes (Signed)
[facility] Emergency Department Provider Note    First MD Initiated Contact with Patient 08/28/17 1425     (approximate)  I have reviewed the triage vital signs and the nursing notes.   HISTORY  Chief Complaint Nausea and Weakness    HPI Jamie Arias is a 62 y.o. female she has a past medical history as listed below on Monday Wednesday Friday dialysis presents to the ER from dialysis after she had severe dizziness and weakness and inability to stand.  Patient states she has a history of vertigo and feels similar but this 1 is only occurring when she standing up.  Denies any chest pain or shortness of breath.  States that yesterday she was feeling unwell went to see her PCP.  Routine lab work was ordered but patient did not get this completed.  She denies any dysuria or fevers.  No numbness or tingling.  States that she has not had anything to eat today prior to dialysis.    Past Medical History:  Diagnosis Date  . Anemia   . Chronic diastolic CHF (congestive heart failure) ([place])    a. Due to ischemic cardiomyopathy. EF as low as 35%, improved to normal s/p CABG; b. echo 07/06/13: EF 55-60%, no RWMAs, mod TR, trivial pericardial effusion not c/w tamponade physiology;  c. 10/2015 Echo: EF 65%, Gr1 DD, triv AI, mild MR, mildly dil LA, mod TR, PASP 23mHg.  .Marland KitchenCoronary artery disease    a. NSTEMI 06/2013; b.cath: severe three-vessel CAD w/ EF 30% & mild-mod MR; c. s/p 3 vessel CABG 07/02/13 (LIMA-LAD, SVG-OM, and SVG-RPDA);  d. 10/2015 MV: no ischemia/infarct.  . Diabetes mellitus without complication ([place]   . Diabetic neuropathy ([place]   . Dialysis patient ([facility]    Tues, Thurs, Sat  . ESRD (end stage renal disease) ([place]    a. 12/2015 initiated TTS dialysis.  .Marland KitchenGERD (gastroesophageal reflux disease)   . Hyperlipidemia   . Hypertension   . Hypothyroidism   . Myocardial infarction ([place] 2015  . Neuropathy   . Pleural effusion 2015  . Pulmonary hypertension  ([place]   . Wears dentures    full lower   Family History  Problem Relation Age of Onset  . COPD Mother   . Cancer Mother        Lung  . Pulmonary embolism Father   . Diabetes Father   . Diabetes Paternal Grandfather   . Heart disease Maternal Grandmother   . Colon cancer Neg Hx   . Colon polyps Neg Hx   . Esophageal cancer Neg Hx   . Pancreatic cancer Neg Hx   . Liver disease Neg Hx    Past Surgical History:  Procedure Laterality Date  . A/V FISTULAGRAM Right 12/17/2016   Procedure: A/V Fistulagram;  Surgeon: DAlgernon Huxley MD;  [facility];  Service: Cardiovascular;  Laterality: Right;  . A/V FISTULAGRAM Right 01/07/2017   Procedure: A/V Fistulagram;  Surgeon: DAlgernon Huxley MD;  [place];  Service: Cardiovascular;  Laterality: Right;  . A/V SHUNT INTERVENTION N/A 02/22/2017   Procedure: A/V SHUNT INTERVENTION;  Surgeon: DAlgernon Huxley MD;  [place] [facility];  Service: Cardiovascular;  Laterality: N/A;  . AV FISTULA PLACEMENT Right 02/03/2016   Procedure: INSERTION OF ARTERIOVENOUS (AV) GORE-TEX GRAFT ARM ( BRACH / AXILLARY );  Surgeon: GKatha Cabal MD;  Location: [facility] OR[facility];  Service: Vascular;  Laterality: Right;  . CARDIAC  CATHETERIZATION    . CATARACT EXTRACTION Bilateral   . CHOLECYSTECTOMY N/A 12/09/2014   Procedure: LAPAROSCOPIC CHOLECYSTECTOMY;  Surgeon: Marlyce [name], MD;  Location: [facility];  Service: General;  Laterality: N/A;  . CORONARY ARTERY BYPASS GRAFT N/A 07/02/2013   Procedure: CORONARY ARTERY BYPASS GRAFTING (CABG);  Surgeon: Ivin [name], MD;  Location: [place];  Service: Open Heart Surgery;  Laterality: N/A;  CABG x three, using left internal mammary artery and right leg greater saphenous vein harvested endoscopically  . ESOPHAGOGASTRODUODENOSCOPY (EGD) WITH PROPOFOL N/A 11/24/2015   Procedure: ESOPHAGOGASTRODUODENOSCOPY (EGD) WITH PROPOFOL;  Surgeon: [name], MD;  Location: [place];   Service: Endoscopy;  Laterality: N/A;  Diabetic - insulin  . EYE SURGERY Bilateral    Cataract Extraction with IOL  . INTRAOPERATIVE TRANSESOPHAGEAL ECHOCARDIOGRAM N/A 07/02/2013   Procedure: INTRAOPERATIVE TRANSESOPHAGEAL ECHOCARDIOGRAM;  Surgeon: Ivin [name], MD;  Location: [place];  Service: Open Heart Surgery;  Laterality: N/A;  . PERIPHERAL VASCULAR CATHETERIZATION Right 12/06/2015   Procedure: Dialysis/Perma Catheter Insertion;  Surgeon: Katha Cabal, MD;  Location: [place] [facility];  Service: Cardiovascular;  Laterality: Right;  . PERIPHERAL VASCULAR THROMBECTOMY Right 09/28/2016   Procedure: Peripheral Vascular Thrombectomy;  Surgeon: Katha Cabal, MD;  Location: [place] [facility];  Service: Cardiovascular;  Laterality: Right;  . PORTA CATH REMOVAL N/A 06/01/2016   Procedure: Glori [name] Cath Removal;  Surgeon: Katha Cabal, MD;  Location: [place] [facility];  Service: Cardiovascular;  Laterality: N/A;  . THORACENTESIS Left 2015   Patient Active Problem List   Diagnosis Date Noted  . Chronic neck pain 07/25/2017  . Hyperkalemia 02/15/2017  . Complication from renal dialysis device 12/01/2016  . ESRD on dialysis ([place]) 01/19/2016  . Nausea   . GI bleed 10/25/2015  . Hypertensive heart disease 10/24/2015  . Chronic diastolic CHF (congestive heart failure) ([place]) 10/24/2015  . Unstable angina ([place]) 10/23/2015  . Low BP 09/02/2015  . Chronic systolic CHF (congestive heart failure) ([place]) 09/02/2015  . Depression 07/27/2015  . Nausea without vomiting 07/07/2015  . Encounter for routine gynecological examination 07/06/2015  . Routine general medical examination at a health care facility 07/06/2015  . Cough 05/25/2015  . Calculus of gallbladder with chronic cholecystitis without obstruction   . Pre-operative cardiovascular examination 11/30/2014  . Bilateral carotid bruits 11/30/2014  . Low magnesium levels 08/10/2013  . Anemia 08/10/2013  . Constipation 08/10/2013    . Postoperative atrial fibrillation ([place]) 07/30/2013  . Coronary artery disease   . CAD (coronary artery disease) 07/02/2013  . Acute systolic heart failure ([place]) 06/30/2013  . NSTEMI (non-ST elevated myocardial infarction) ([place]) 06/29/2013  . Vertigo 08/25/2012  . Sleep disorder 05/23/2012  . Hypothyroid 04/27/2012  . HTN (hypertension) 04/27/2012  . HLD (hyperlipidemia) 04/27/2012  . Diabetes mellitus, type 2 ([place]) 04/27/2012  . Neuropathy 04/27/2012      Prior to Admission medications   Medication Sig Start Date End Date Taking? Authorizing Provider  ACCU-CHEK FASTCLIX LANCETS MISC Check blood glucose twice daily, diagnosis code E11.9 12/13/16   [name], MD  acetaminophen (TYLENOL) 325 MG tablet Take 650 mg by mouth daily as needed for moderate pain or headache.     [provider]  aspirin EC 81 MG tablet Take 81 mg by mouth daily.    [provider]  atorvastatin (LIPITOR) 80 MG tablet Take 1 tablet (80 mg total) by mouth daily. 01/31/17   Wellington Hampshire, MD  b complex-vitamin c-folic acid (  NEPHRO-VITE) 0.8 MG TABS tablet Take 1 tablet by mouth at bedtime.    [provider]  blood glucose meter kit and supplies KIT Dispense based on patient and insurance preference. Use up to twice daily. (FOR ICD-10 E11.9) 07/01/15   Rubbie Battiest, RN  Blood Glucose Monitoring Suppl (ACCU-CHEK AVIVA PLUS) w/Device KIT Use as directed to test blood sugar up to three times daily 05/06/17   [name], MD  calcium acetate (PHOSLO) 667 MG capsule Take 667 mg by mouth 3 (three) times daily with meals.    [provider]  ergocalciferol (VITAMIN D2) 50000 units capsule Take 50,000 Units by mouth once a week.     [provider]  ezetimibe (ZETIA) 10 MG tablet Take 1 tablet (10 mg total) by mouth daily. 07/19/17 10/17/17  Wellington Hampshire, MD  ferrous sulfate 325 (65 FE) MG tablet Take 325 mg by mouth daily with breakfast.    [provider]  furosemide (LASIX) 20 MG tablet Take 20 mg by mouth every other day. Non-dialysis days    [provider]  gabapentin (NEURONTIN) 300 MG capsule TAKE 1 CAPSULE(300 MG) BY MOUTH THREE TIMES DAILY 08/05/17   [name], MD  glucose blood (ACCU-CHEK AVIVA) test strip Use as instructed to test blood sugar up to 3 times daily 05/06/17   [name], MD  Insulin Glargine (BASAGLAR KWIKPEN) 100 UNIT/ML SOPN Inject 0.25-0.35 mLs (25-35 Units total) into the skin daily. Patient taking differently: Inject 28 Units into the skin daily.  07/01/17   [name], MD  Insulin Pen Needle (GLOBAL EASE INJECT PEN NEEDLES) 31G X 5 MM MISC Inject 0.1 mLs (10 Units total) into the skin at bedtime 01/12/16   [name], MD  Lancets (ACCU-CHEK SOFT TOUCH) lancets Use as instructed 05/06/17   [name], MD  Lancets Misc. (ACCU-CHEK SOFTCLIX LANCET DEV) KIT Use as directed to test blood sugar up to three times daily 05/06/17   [name], MD  levothyroxine (SYNTHROID, LEVOTHROID) 75 MCG tablet TAKE 1 TABLET(75 MCG) BY MOUTH DAILY 08/05/17   [name], MD  lidocaine-prilocaine (EMLA) cream Apply 1 application topically as needed (port access).    [provider]  meclizine (ANTIVERT) 12.5 MG tablet Take 1 tablet (12.5 mg total) by mouth 3 (three) times daily as needed for dizziness. 08/28/17   [name], MD  midodrine (PROAMATINE) 5 MG tablet Take 10 mg by mouth 2 (two) times daily with a meal.     [provider]  nitroGLYCERIN (NITROSTAT) 0.4 MG SL tablet DISSOLVE ONE TABLET UNDER THE TONGUE EVERY 5 MINUTES AS NEEDED FOR CHEST PAIN.  DO NOT EXCEED A TOTAL OF 3 DOSES IN 15 MINUTES 01/18/16   Wellington Hampshire, MD  omeprazole (PRILOSEC) 20 MG capsule Take 40 mg by mouth 2 (two) times daily.    [provider]  ondansetron (ZOFRAN-ODT) 4 MG disintegrating tablet Take 4 mg by mouth as needed. 08/23/17   [provider]   traZODone (DESYREL) 50 MG tablet TAKE 1 TABLET BY MOUTH EVERY NIGHT AT BEDTIME 06/05/17   [name], MD    Allergies Nsaids and Doxycycline    Social History Social History   Tobacco Use  . Smoking status: Never Smoker  . Smokeless tobacco: Never Used  Substance Use Topics  . Alcohol use: No    Alcohol/week: 0.0 oz  . Drug use: No    Review  of Systems Patient denies headaches, rhinorrhea, blurry vision, numbness, shortness of breath, chest pain, edema, cough, abdominal pain, nausea, vomiting, diarrhea, dysuria, fevers, rashes or hallucinations unless otherwise stated above in HPI. ____________________________________________   PHYSICAL EXAM:  VITAL SIGNS: Vitals:   08/28/17 1430 08/28/17 1442  BP: 126/60 126/60  Pulse:  87  Resp:  18  Temp:  97.6 F (36.4 C)  SpO2:  98%    Constitutional: Alert and oriented.  Eyes: Conjunctivae are normal.  Head: Atraumatic. Nose: No congestion/rhinnorhea. Mouth/Throat: Mucous membranes are moist.   Neck: No stridor. Painless ROM.  Cardiovascular: Normal rate, regular rhythm. Grossly normal heart sounds.  Good peripheral circulation.  Dialysis acces in RUE, no hemorrhage Respiratory: Normal respiratory effort.  No retractions. Lungs CTAB. Gastrointestinal: Soft and nontender. No distention. No abdominal bruits. No CVA tenderness. Genitourinary:  Musculoskeletal: No lower extremity tenderness nor edema.  No joint effusions. Neurologic:  Normal speech and language. No gross focal neurologic deficits are appreciated. No facial droop Skin:  Skin is warm, dry and intact. No rash noted. Psychiatric: Mood and affect are normal. Speech and behavior are normal.  ____________________________________________   LABS (all labs ordered are listed, but only abnormal results are displayed)  Results for orders placed or performed during the hospital encounter of 08/28/17 (from the past 24 hour(s))  CBC with Differential/Platelet      Status: Abnormal   Collection Time: 08/28/17  2:45 PM  Result Value Ref Range   WBC 6.7 3.6 - 11.0 K/uL   RBC 4.02 3.80 - 5.20 MIL/uL   Hemoglobin 12.9 12.0 - 16.0 g/dL   HCT 38.2 35.0 - 47.0 %   MCV 94.9 80.0 - 100.0 fL   MCH 32.0 26.0 - 34.0 pg   MCHC 33.7 32.0 - 36.0 g/dL   RDW 16.8 (H) 11.5 - 14.5 %   Platelets 148 (L) 150 - 440 K/uL   Neutrophils Relative % 87 %   Neutro Abs 5.8 1.4 - 6.5 K/uL   Lymphocytes Relative 7 %   Lymphs Abs 0.4 (L) 1.0 - 3.6 K/uL   Monocytes Relative 5 %   Monocytes Absolute 0.4 0.2 - 0.9 K/uL   Eosinophils Relative 0 %   Eosinophils Absolute 0.0 0 - 0.7 K/uL   Basophils Relative 1 %   Basophils Absolute 0.1 0 - 0.1 K/uL  Basic metabolic panel     Status: Abnormal   Collection Time: 08/28/17  2:45 PM  Result Value Ref Range   Sodium 136 135 - 145 mmol/L   Potassium 3.6 3.5 - 5.1 mmol/L   Chloride 93 (L) 98 - 111 mmol/L   CO2 24 22 - 32 mmol/L   Glucose, Bld 94 70 - 99 mg/dL   BUN 22 8 - 23 mg/dL   Creatinine, Ser 5.37 (H) 0.44 - 1.00 mg/dL   Calcium 9.0 8.9 - 10.3 mg/dL   GFR calc non Af Amer 8 (L) >60 mL/min   GFR calc Af Amer 9 (L) >60 mL/min   Anion gap 19 (H) 5 - 15   ____________________________________________  EKG My review and personal interpretation at Time: 14:37   Indication: sinus  Rate: 85  Rhythm: sinus Axis: normal Other: normal intervals, no stemi ____________________________________________  RADIOLOGY  I personally reviewed all radiographic images ordered to evaluate for the above acute complaints and reviewed radiology reports and findings.  These findings were personally discussed with the patient.  Please see medical record for radiology report.  ____________________________________________   PROCEDURES  Procedure(s) performed:  Procedures    Critical Care performed: no ____________________________________________   INITIAL IMPRESSION / ASSESSMENT AND PLAN / ED COURSE  Pertinent labs & imaging results  that were available during my care of the patient were reviewed by me and considered in my medical decision making (see chart for details).   DDX: Dehydration, vertigo, lecture light abnormality, sepsis, UTI, dysrhythmia  Jamie Arias is a 62 y.o. who presents to the ED with symptoms as described above.  Patient describes lightheadedness only when standing and dizziness.  No focal deficits.  Currently asymptomatic sitting down.  Denies any chest pain.  No fevers.  EKG shows no evidence of ischemia or dysrhythmia.  Patient will be given small bolus of IV fluids as well as meclizine and she does state that she has a history of vertigo.  Patient will be signed out to oncoming physician pending follow-up on labs and reassessment.      As part of my medical decision making, I reviewed the following data within the [place] notes reviewed and incorporated, Labs reviewed, notes from prior ED visits.   ____________________________________________   FINAL CLINICAL IMPRESSION(S) / ED DIAGNOSES  Final diagnoses:  Lightheadedness  ESRD on dialysis ([place])      NEW MEDICATIONS STARTED DURING THIS VISIT:  New Prescriptions   MECLIZINE (ANTIVERT) 12.5 MG TABLET    Take 1 tablet (12.5 mg total) by mouth 3 (three) times daily as needed for dizziness.     Note:  This document was prepared using Dragon voice recognition software and may include unintentional dictation errors.    Merlyn Lot, MD 08/28/17 1535

## 2017-08-28 NOTE — Telephone Encounter (Signed)
Noted.  ED note reviewed.

## 2017-08-28 NOTE — ED Notes (Signed)
Dialysis made aware that pt needs to be de-accessed. Will send someone when available.

## 2017-09-01 DIAGNOSIS — Z992 Dependence on renal dialysis: Secondary | ICD-10-CM | POA: Diagnosis not present

## 2017-09-01 DIAGNOSIS — N186 End stage renal disease: Secondary | ICD-10-CM | POA: Diagnosis not present

## 2017-09-02 ENCOUNTER — Encounter: Payer: Self-pay | Admitting: Family Medicine

## 2017-09-03 ENCOUNTER — Ambulatory Visit (INDEPENDENT_AMBULATORY_CARE_PROVIDER_SITE_OTHER): Payer: Medicare Other | Admitting: Vascular Surgery

## 2017-09-03 ENCOUNTER — Encounter (INDEPENDENT_AMBULATORY_CARE_PROVIDER_SITE_OTHER): Payer: Medicare Other

## 2017-09-04 ENCOUNTER — Emergency Department: Payer: Medicare Other

## 2017-09-04 ENCOUNTER — Encounter: Payer: Self-pay | Admitting: Emergency Medicine

## 2017-09-04 ENCOUNTER — Inpatient Hospital Stay
Admission: EM | Admit: 2017-09-04 | Discharge: 2017-09-06 | DRG: 683 | Disposition: A | Discharge status: Home / Self Care | Payer: Medicare Other | Attending: Internal Medicine | Admitting: Internal Medicine

## 2017-09-04 ENCOUNTER — Other Ambulatory Visit: Payer: Self-pay

## 2017-09-04 DIAGNOSIS — Z7982 Long term (current) use of aspirin: Secondary | ICD-10-CM

## 2017-09-04 DIAGNOSIS — K219 Gastro-esophageal reflux disease without esophagitis: Secondary | ICD-10-CM | POA: Diagnosis present

## 2017-09-04 DIAGNOSIS — Z79899 Other long term (current) drug therapy: Secondary | ICD-10-CM | POA: Diagnosis not present

## 2017-09-04 DIAGNOSIS — N2581 Secondary hyperparathyroidism of renal origin: Secondary | ICD-10-CM | POA: Diagnosis present

## 2017-09-04 DIAGNOSIS — I5032 Chronic diastolic (congestive) heart failure: Secondary | ICD-10-CM | POA: Diagnosis present

## 2017-09-04 DIAGNOSIS — D631 Anemia in chronic kidney disease: Secondary | ICD-10-CM | POA: Diagnosis present

## 2017-09-04 DIAGNOSIS — I129 Hypertensive chronic kidney disease with stage 1 through stage 4 chronic kidney disease, or unspecified chronic kidney disease: Secondary | ICD-10-CM | POA: Diagnosis not present

## 2017-09-04 DIAGNOSIS — E785 Hyperlipidemia, unspecified: Secondary | ICD-10-CM | POA: Diagnosis present

## 2017-09-04 DIAGNOSIS — N19 Unspecified kidney failure: Secondary | ICD-10-CM | POA: Diagnosis present

## 2017-09-04 DIAGNOSIS — Z794 Long term (current) use of insulin: Secondary | ICD-10-CM

## 2017-09-04 DIAGNOSIS — Z992 Dependence on renal dialysis: Secondary | ICD-10-CM

## 2017-09-04 DIAGNOSIS — N186 End stage renal disease: Secondary | ICD-10-CM | POA: Diagnosis present

## 2017-09-04 DIAGNOSIS — E039 Hypothyroidism, unspecified: Secondary | ICD-10-CM | POA: Diagnosis present

## 2017-09-04 DIAGNOSIS — I132 Hypertensive heart and chronic kidney disease with heart failure and with stage 5 chronic kidney disease, or end stage renal disease: Secondary | ICD-10-CM | POA: Diagnosis present

## 2017-09-04 DIAGNOSIS — E1142 Type 2 diabetes mellitus with diabetic polyneuropathy: Secondary | ICD-10-CM | POA: Diagnosis present

## 2017-09-04 DIAGNOSIS — N189 Chronic kidney disease, unspecified: Secondary | ICD-10-CM | POA: Diagnosis not present

## 2017-09-04 DIAGNOSIS — Z9115 Patient's noncompliance with renal dialysis: Secondary | ICD-10-CM

## 2017-09-04 DIAGNOSIS — I272 Pulmonary hypertension, unspecified: Secondary | ICD-10-CM | POA: Diagnosis present

## 2017-09-04 DIAGNOSIS — I252 Old myocardial infarction: Secondary | ICD-10-CM

## 2017-09-04 DIAGNOSIS — Z951 Presence of aortocoronary bypass graft: Secondary | ICD-10-CM

## 2017-09-04 DIAGNOSIS — N179 Acute kidney failure, unspecified: Principal | ICD-10-CM | POA: Diagnosis present

## 2017-09-04 DIAGNOSIS — G251 Drug-induced tremor: Secondary | ICD-10-CM | POA: Diagnosis present

## 2017-09-04 DIAGNOSIS — I2510 Atherosclerotic heart disease of native coronary artery without angina pectoris: Secondary | ICD-10-CM | POA: Diagnosis present

## 2017-09-04 DIAGNOSIS — I255 Ischemic cardiomyopathy: Secondary | ICD-10-CM | POA: Diagnosis present

## 2017-09-04 DIAGNOSIS — E1122 Type 2 diabetes mellitus with diabetic chronic kidney disease: Secondary | ICD-10-CM | POA: Diagnosis present

## 2017-09-04 DIAGNOSIS — T450X5A Adverse effect of antiallergic and antiemetic drugs, initial encounter: Secondary | ICD-10-CM | POA: Diagnosis present

## 2017-09-04 DIAGNOSIS — R42 Dizziness and giddiness: Secondary | ICD-10-CM | POA: Diagnosis present

## 2017-09-04 DIAGNOSIS — J9811 Atelectasis: Secondary | ICD-10-CM | POA: Diagnosis not present

## 2017-09-04 DIAGNOSIS — R531 Weakness: Secondary | ICD-10-CM | POA: Diagnosis not present

## 2017-09-04 DIAGNOSIS — I9589 Other hypotension: Secondary | ICD-10-CM | POA: Diagnosis present

## 2017-09-04 DIAGNOSIS — M6281 Muscle weakness (generalized): Secondary | ICD-10-CM | POA: Diagnosis not present

## 2017-09-04 DIAGNOSIS — N390 Urinary tract infection, site not specified: Secondary | ICD-10-CM | POA: Diagnosis not present

## 2017-09-04 DIAGNOSIS — Z888 Allergy status to other drugs, medicaments and biological substances status: Secondary | ICD-10-CM

## 2017-09-04 DIAGNOSIS — Z881 Allergy status to other antibiotic agents status: Secondary | ICD-10-CM

## 2017-09-04 LAB — URINALYSIS, COMPLETE (UACMP) WITH MICROSCOPIC
Bilirubin Urine: NEGATIVE
Glucose, UA: NEGATIVE mg/dL
Ketones, ur: 5 mg/dL — AB
Nitrite: NEGATIVE
Protein, ur: 100 mg/dL — AB
Specific Gravity, Urine: 1.014 (ref 1.005–1.030)
Squamous Epithelial / LPF: >50 — ABNORMAL HIGH (ref 0–5)
WBC, UA: >50 WBC/hpf — ABNORMAL HIGH (ref 0–5)
pH: 6 (ref 5.0–8.0)

## 2017-09-04 LAB — MRSA PCR SCREENING: MRSA by PCR: NEGATIVE

## 2017-09-04 LAB — CBC
HCT: 38.3 % (ref 35.0–47.0)
HCT: 36.8 % (ref 35.0–47.0)
Hemoglobin: 13.5 g/dL (ref 12.0–16.0)
Hemoglobin: 12.5 g/dL (ref 12.0–16.0)
MCH: 33.1 pg (ref 26.0–34.0)
MCH: 32.4 pg (ref 26.0–34.0)
MCHC: 35.1 g/dL (ref 32.0–36.0)
MCHC: 34 g/dL (ref 32.0–36.0)
MCV: 94.2 fL (ref 80.0–100.0)
MCV: 95.3 fL (ref 80.0–100.0)
Platelets: 215 10*3/uL (ref 150–440)
Platelets: 209 10*3/uL (ref 150–440)
RBC: 4.07 MIL/uL (ref 3.80–5.20)
RBC: 3.86 MIL/uL (ref 3.80–5.20)
RDW: 16.1 % — ABNORMAL HIGH (ref 11.5–14.5)
RDW: 16.4 % — ABNORMAL HIGH (ref 11.5–14.5)
WBC: 7.2 10*3/uL (ref 3.6–11.0)
WBC: 7.5 10*3/uL (ref 3.6–11.0)

## 2017-09-04 LAB — CREATININE, SERUM
Creatinine, Ser: 15.96 mg/dL — ABNORMAL HIGH (ref 0.44–1.00)
GFR calc Af Amer: 2 mL/min — ABNORMAL LOW (ref >60)
GFR calc non Af Amer: 2 mL/min — ABNORMAL LOW (ref >60)

## 2017-09-04 LAB — BASIC METABOLIC PANEL
Anion gap: 27 — ABNORMAL HIGH (ref 5–15)
BUN: 91 mg/dL — ABNORMAL HIGH (ref 8–23)
CO2: 17 mmol/L — ABNORMAL LOW (ref 22–32)
Calcium: 8.2 mg/dL — ABNORMAL LOW (ref 8.9–10.3)
Chloride: 96 mmol/L — ABNORMAL LOW (ref 98–111)
Creatinine, Ser: 15.22 mg/dL — ABNORMAL HIGH (ref 0.44–1.00)
GFR calc Af Amer: 3 mL/min — ABNORMAL LOW (ref >60)
GFR calc non Af Amer: 2 mL/min — ABNORMAL LOW (ref >60)
Glucose, Bld: 93 mg/dL (ref 70–99)
Potassium: 4.9 mmol/L (ref 3.5–5.1)
Sodium: 140 mmol/L (ref 135–145)

## 2017-09-04 LAB — PHOSPHORUS: Phosphorus: 8.7 mg/dL — ABNORMAL HIGH (ref 2.5–4.6)

## 2017-09-04 LAB — GLUCOSE, CAPILLARY: Glucose-Capillary: 181 mg/dL — ABNORMAL HIGH (ref 70–99)

## 2017-09-04 MED ORDER — ATORVASTATIN CALCIUM 20 MG PO TABS
80.0000 mg | ORAL_TABLET | Freq: Every evening | ORAL | Status: DC
  Administered 2017-09-04 – 2017-09-05 (×2): 80 mg via ORAL
  Filled 2017-09-04 (×2): qty 4

## 2017-09-04 MED ORDER — SODIUM CHLORIDE 0.9 % IV SOLN
2.0000 g | Freq: Every day | INTRAVENOUS | Status: DC
  Administered 2017-09-04: 2 g via INTRAVENOUS
  Filled 2017-09-04: qty 20

## 2017-09-04 MED ORDER — BISACODYL 5 MG PO TBEC: 5.0000 mg | DELAYED_RELEASE_TABLET | Freq: Every day | ORAL | Status: DC | PRN

## 2017-09-04 MED ORDER — INSULIN ASPART 100 UNIT/ML ~~LOC~~ SOLN
0.0000 [IU] | Freq: Three times a day (TID) | SUBCUTANEOUS | Status: DC
  Administered 2017-09-05: 1 [IU] via SUBCUTANEOUS
  Filled 2017-09-04: qty 1

## 2017-09-04 MED ORDER — SODIUM CHLORIDE 0.9 % IV SOLN
1.0000 g | INTRAVENOUS | Status: DC
  Administered 2017-09-05: 1 g via INTRAVENOUS
  Filled 2017-09-04 (×2): qty 10
  Filled 2017-09-04: qty 1

## 2017-09-04 MED ORDER — GABAPENTIN 300 MG PO CAPS
300.0000 mg | ORAL_CAPSULE | Freq: Three times a day (TID) | ORAL | Status: DC
  Administered 2017-09-04 – 2017-09-06 (×6): 300 mg via ORAL
  Filled 2017-09-04 (×5): qty 1

## 2017-09-04 MED ORDER — ONDANSETRON HCL 4 MG/2ML IJ SOLN
4.0000 mg | Freq: Four times a day (QID) | INTRAMUSCULAR | Status: DC | PRN
  Filled 2017-09-04: qty 2

## 2017-09-04 MED ORDER — CALCIUM ACETATE (PHOS BINDER) 667 MG PO CAPS
667.0000 mg | ORAL_CAPSULE | Freq: Three times a day (TID) | ORAL | Status: DC
  Administered 2017-09-05 (×3): 667 mg via ORAL
  Filled 2017-09-04 (×4): qty 1

## 2017-09-04 MED ORDER — HEPARIN SODIUM (PORCINE) 1000 UNIT/ML DIALYSIS
1000.0000 [IU] | INTRAMUSCULAR | Status: DC | PRN
  Filled 2017-09-04: qty 1

## 2017-09-04 MED ORDER — HEPARIN SODIUM (PORCINE) 5000 UNIT/ML IJ SOLN
5000.0000 [IU] | Freq: Three times a day (TID) | INTRAMUSCULAR | Status: DC
  Administered 2017-09-04 – 2017-09-06 (×5): 5000 [IU] via SUBCUTANEOUS
  Filled 2017-09-04 (×5): qty 1

## 2017-09-04 MED ORDER — DOCUSATE SODIUM 100 MG PO CAPS
100.0000 mg | ORAL_CAPSULE | Freq: Two times a day (BID) | ORAL | Status: DC
  Administered 2017-09-04 – 2017-09-05 (×3): 100 mg via ORAL
  Filled 2017-09-04 (×4): qty 1

## 2017-09-04 MED ORDER — TRAZODONE HCL 50 MG PO TABS: 25.0000 mg | ORAL_TABLET | Freq: Every evening | ORAL | Status: DC | PRN

## 2017-09-04 MED ORDER — ASPIRIN EC 81 MG PO TBEC
81.0000 mg | DELAYED_RELEASE_TABLET | Freq: Every day | ORAL | Status: DC
  Administered 2017-09-05: 81 mg via ORAL
  Filled 2017-09-04: qty 1

## 2017-09-04 MED ORDER — FUROSEMIDE 20 MG PO TABS
20.0000 mg | ORAL_TABLET | ORAL | Status: DC
  Administered 2017-09-05: 20 mg via ORAL
  Filled 2017-09-04: qty 1

## 2017-09-04 MED ORDER — EZETIMIBE 10 MG PO TABS
10.0000 mg | ORAL_TABLET | Freq: Every day | ORAL | Status: DC
  Administered 2017-09-05 – 2017-09-06 (×2): 10 mg via ORAL
  Filled 2017-09-04 (×2): qty 1

## 2017-09-04 MED ORDER — HYDROCODONE-ACETAMINOPHEN 5-325 MG PO TABS
1.0000 | ORAL_TABLET | ORAL | Status: DC | PRN
  Administered 2017-09-04: 2 via ORAL
  Filled 2017-09-04: qty 2

## 2017-09-04 MED ORDER — FERROUS SULFATE 325 (65 FE) MG PO TABS
325.0000 mg | ORAL_TABLET | Freq: Every day | ORAL | Status: DC
  Administered 2017-09-05 – 2017-09-06 (×2): 325 mg via ORAL
  Filled 2017-09-04 (×2): qty 1

## 2017-09-04 MED ORDER — CHLORHEXIDINE GLUCONATE CLOTH 2 % EX PADS: 6.0000 | MEDICATED_PAD | Freq: Every day | CUTANEOUS | Status: DC

## 2017-09-04 MED ORDER — ONDANSETRON HCL 4 MG PO TABS
4.0000 mg | ORAL_TABLET | Freq: Four times a day (QID) | ORAL | Status: DC | PRN
  Filled 2017-09-04: qty 1

## 2017-09-04 MED ORDER — VITAMIN D (ERGOCALCIFEROL) 1.25 MG (50000 UNIT) PO CAPS: 50000.0000 [IU] | ORAL_CAPSULE | ORAL | Status: DC

## 2017-09-04 MED ORDER — PROMETHAZINE HCL 25 MG/ML IJ SOLN
12.5000 mg | Freq: Four times a day (QID) | INTRAMUSCULAR | Status: DC | PRN
  Administered 2017-09-04: 12.5 mg via INTRAVENOUS
  Filled 2017-09-04: qty 1

## 2017-09-04 NOTE — H&P (Addendum)
[facility] [place] NAME: Jamie Arias    MR#:  [id number]  DATE OF BIRTH:  05-01-1955  DATE OF ADMISSION:  09/04/2017  PRIMARY CARE PHYSICIAN: [name], MD   REQUESTING/REFERRING PHYSICIAN: Dr. Merlyn Lot  CHIEF COMPLAINT:   Chief Complaint  Patient presents with  . Weakness    HISTORY OF PRESENT ILLNESS:  Jamie Arias  is a 62 y.o. female with a known history of ESRD on hemodialysis missed hemodialysis last Friday, this Monday because of generalized weakness she could not go for dialysis.  Last hemodialysis was last Wednesday.  Today she came to hospital because of generalized weakness, nausea, tremors.  Patient has no dysuria or fever.  Denies any shortness of breath, or leg edema.  Has poor appetite.  PAST MEDICAL HISTORY:   Past Medical History:  Diagnosis Date  . Anemia   . Chronic diastolic CHF (congestive heart failure) ([place])    a. Due to ischemic cardiomyopathy. EF as low as 35%, improved to normal s/p CABG; b. echo 07/06/13: EF 55-60%, no RWMAs, mod TR, trivial pericardial effusion not c/w tamponade physiology;  c. 10/2015 Echo: EF 65%, Gr1 DD, triv AI, mild MR, mildly dil LA, mod TR, PASP 27mHg.  .Marland KitchenCoronary artery disease    a. NSTEMI 06/2013; b.cath: severe three-vessel CAD w/ EF 30% & mild-mod MR; c. s/p 3 vessel CABG 07/02/13 (LIMA-LAD, SVG-OM, and SVG-RPDA);  d. 10/2015 MV: no ischemia/infarct.  . Diabetes mellitus without complication ([place]   . Diabetic neuropathy ([facility]   . Dialysis patient ([facility]    Tues, Thurs, Sat  . ESRD (end stage renal disease) ([facility]    a. 12/2015 initiated TTS dialysis.  .Marland KitchenGERD (gastroesophageal reflux disease)   . Hyperlipidemia   . Hypertension   . Hypothyroidism   . Myocardial infarction ([facility] 2015  . Neuropathy   . Pleural effusion 2015  . Pulmonary hypertension ([facility]   . Wears dentures    full lower    PAST SURGICAL HISTOIRY:   Past Surgical History:  Procedure  Laterality Date  . A/V FISTULAGRAM Right 12/17/2016   Procedure: A/V Fistulagram;  Surgeon: DAlgernon Huxley MD;  [facility];  Service: Cardiovascular;  Laterality: Right;  . A/V FISTULAGRAM Right 01/07/2017   Procedure: A/V Fistulagram;  Surgeon: DAlgernon Huxley MD;  [facility];  Service: Cardiovascular;  Laterality: Right;  . A/V SHUNT INTERVENTION N/A 02/22/2017   Procedure: A/V SHUNT INTERVENTION;  Surgeon: DAlgernon Huxley MD;  [facility];  Service: Cardiovascular;  Laterality: N/A;  . AV FISTULA PLACEMENT Right 02/03/2016   Procedure: INSERTION OF ARTERIOVENOUS (AV) GORE-TEX GRAFT ARM ( BRACH / AXILLARY );  Surgeon: GKatha Cabal MD;  Location: [facility];  Service: Vascular;  Laterality: Right;  . CARDIAC CATHETERIZATION    . CATARACT EXTRACTION Bilateral   . CHOLECYSTECTOMY N/A 12/09/2014   Procedure: LAPAROSCOPIC CHOLECYSTECTOMY;  Surgeon: CMarlyce [name] MD;  Location: [facility];  Service: General;  Laterality: N/A;  . CORONARY ARTERY BYPASS GRAFT N/A 07/02/2013   Procedure: CORONARY ARTERY BYPASS GRAFTING (CABG);  Surgeon: PIvin [name] MD;  [place]  Service: Open Heart Surgery;  Laterality: N/A;  CABG x three, using left internal mammary artery and right leg greater saphenous vein harvested endoscopically  . ESOPHAGOGASTRODUODENOSCOPY (EGD) WITH PROPOFOL N/A 11/24/2015   Procedure: ESOPHAGOGASTRODUODENOSCOPY (EGD) WITH PROPOFOL;  Surgeon: DLucilla [name] MD;  [facility];  Service: Endoscopy;  Laterality: N/A;  Diabetic - insulin  . EYE SURGERY Bilateral    Cataract Extraction with IOL  . INTRAOPERATIVE TRANSESOPHAGEAL ECHOCARDIOGRAM N/A 07/02/2013   Procedure: INTRAOPERATIVE TRANSESOPHAGEAL ECHOCARDIOGRAM;  Surgeon: Ivin [name], MD;  Location: [place];  Service: Open Heart Surgery;  Laterality: N/A;  . PERIPHERAL VASCULAR CATHETERIZATION Right 12/06/2015   Procedure: Dialysis/Perma Catheter  Insertion;  Surgeon: Katha Cabal, MD;  Location: [place] [facility];  Service: Cardiovascular;  Laterality: Right;  . PERIPHERAL VASCULAR THROMBECTOMY Right 09/28/2016   Procedure: Peripheral Vascular Thrombectomy;  Surgeon: Katha Cabal, MD;  Location: [place] [facility];  Service: Cardiovascular;  Laterality: Right;  . PORTA CATH REMOVAL N/A 06/01/2016   Procedure: Glori [name] Cath Removal;  Surgeon: Katha Cabal, MD;  Location: [place] [facility];  Service: Cardiovascular;  Laterality: N/A;  . THORACENTESIS Left 2015    SOCIAL HISTORY:   Social History   Tobacco Use  . Smoking status: Never Smoker  . Smokeless tobacco: Never Used  Substance Use Topics  . Alcohol use: No    Alcohol/week: 0.0 oz    FAMILY HISTORY:   Family History  Problem Relation Age of Onset  . COPD Mother   . Cancer Mother        Lung  . Pulmonary embolism Father   . Diabetes Father   . Diabetes Paternal Grandfather   . Heart disease Maternal Grandmother   . Colon cancer Neg Hx   . Colon polyps Neg Hx   . Esophageal cancer Neg Hx   . Pancreatic cancer Neg Hx   . Liver disease Neg Hx     DRUG ALLERGIES:   Allergies  Allergen Reactions  . Nsaids Other (See Comments)    Contraindicated due to kidney disease.  Marland Kitchen Doxycycline Other (See Comments)    tremor    REVIEW OF SYSTEMS:  CONSTITUTIONAL: No fever, f she has generalized weakness.   EYES: No blurred or double vision.  EARS, NOSE, AND THROAT: No tinnitus or ear pain.  RESPIRATORY: No cough, shortness of breath, wheezing or hemoptysis.  CARDIOVASCULAR: No chest pain, orthopnea, edema.  GASTROINTESTINAL: No nausea, vomiting, diarrhea or abdominal pain.  GENITOURINARY: No dysuria, hematuria.  ENDOCRINE: No polyuria, nocturia,  HEMATOLOGY: No anemia, easy bruising or bleeding SKIN: No rash or lesion. MUSCULOSKELETAL: No joint pain or arthritis.   NEUROLOGIC: No tingling, numbness, weakness.  PSYCHIATRY: No anxiety or  depression.   MEDICATIONS AT HOME:   Prior to Admission medications   Medication Sig Start Date End Date Taking? Authorizing Provider  acetaminophen (TYLENOL) 325 MG tablet Take 650 mg by mouth daily as needed for moderate pain or headache.    Yes [provider]  aspirin EC 81 MG tablet Take 81 mg by mouth daily.   Yes [provider]  atorvastatin (LIPITOR) 80 MG tablet Take 1 tablet (80 mg total) by mouth daily. 01/31/17  Yes Wellington Hampshire, MD  b complex-vitamin c-folic acid (NEPHRO-VITE) 0.8 MG TABS tablet Take 1 tablet by mouth at bedtime.   Yes [provider]  calcium acetate (PHOSLO) 667 MG capsule Take 667 mg by mouth 3 (three) times daily with meals.   Yes [provider]  ergocalciferol (VITAMIN D2) 50000 units capsule Take 50,000 Units by mouth once a week.    Yes [provider]  ezetimibe (ZETIA) 10 MG tablet Take 1 tablet (10 mg total) by mouth daily. 07/19/17 10/17/17 Yes Wellington Hampshire, MD  ferrous sulfate 325 (65 FE) MG tablet Take 325 mg by mouth daily with breakfast.   Yes [provider]  furosemide (LASIX) 20 MG tablet Take 20 mg by mouth every other day. Non-dialysis days   Yes [provider]  gabapentin (NEURONTIN) 300 MG capsule TAKE 1 CAPSULE(300 MG) BY MOUTH THREE TIMES DAILY 08/05/17  Yes [name], MD  Insulin Glargine (BASAGLAR KWIKPEN) 100 UNIT/ML SOPN Inject 0.25-0.35 mLs (25-35 Units total) into the skin daily. Patient taking differently: Inject 28 Units into the skin daily.  07/01/17  Yes [name], MD  levothyroxine (SYNTHROID, LEVOTHROID) 75 MCG tablet TAKE 1 TABLET(75 MCG) BY MOUTH DAILY 08/05/17  Yes [name], MD  lidocaine-prilocaine (EMLA) cream Apply 1 application topically as needed (port access).   Yes [provider]  meclizine (ANTIVERT) 12.5 MG tablet Take 1 tablet (12.5 mg total) by mouth 3 (three) times daily as needed for dizziness. 08/28/17  Yes  [name], MD  midodrine (PROAMATINE) 5 MG tablet Take 10 mg by mouth 2 (two) times daily with a meal.    Yes [provider]  nitroGLYCERIN (NITROSTAT) 0.4 MG SL tablet DISSOLVE ONE TABLET UNDER THE TONGUE EVERY 5 MINUTES AS NEEDED FOR CHEST PAIN.  DO NOT EXCEED A TOTAL OF 3 DOSES IN 15 MINUTES 01/18/16  Yes Wellington Hampshire, MD  ondansetron (ZOFRAN-ODT) 4 MG disintegrating tablet Take 4 mg by mouth as needed. 08/23/17  Yes [provider]  traZODone (DESYREL) 50 MG tablet TAKE 1 TABLET BY MOUTH EVERY NIGHT AT BEDTIME 06/05/17  Yes [name], MD  ACCU-CHEK FASTCLIX LANCETS MISC Check blood glucose twice daily, diagnosis code E11.9 12/13/16   [name], MD  blood glucose meter kit and supplies KIT Dispense based on patient and insurance preference. Use up to twice daily. (FOR ICD-10 E11.9) 07/01/15   Rubbie Battiest, RN  Blood Glucose Monitoring Suppl (ACCU-CHEK AVIVA PLUS) w/Device KIT Use as directed to test blood sugar up to three times daily 05/06/17   [name], MD  glucose blood (ACCU-CHEK AVIVA) test strip Use as instructed to test blood sugar up to 3 times daily 05/06/17   [name], MD  Insulin Pen Needle (GLOBAL EASE INJECT PEN NEEDLES) 31G X 5 MM MISC Inject 0.1 mLs (10 Units total) into the skin at bedtime 01/12/16   [name], MD  Lancets (ACCU-CHEK SOFT TOUCH) lancets Use as instructed 05/06/17   [name], MD  Lancets Misc. (ACCU-CHEK SOFTCLIX LANCET DEV) KIT Use as directed to test blood sugar up to three times daily 05/06/17   [name], MD      VITAL SIGNS:  Blood pressure (!) 151/102, pulse 83, temperature 97.6 F (36.4 C), temperature source Oral, resp. rate 14, height 5' 4" (1.626 m), weight 90.7 kg (200 lb), SpO2 98 %.  PHYSICAL EXAMINATION:  GENERAL:  62 y.o.-year-old patient lying in the bed with no acute distress.  Patient appears clinically dry. EYES: Pupils equal, round, reactive to light and  accommodation. No scleral icterus. Extraocular muscles intact.  HEENT: Head atraumatic, normocephalic. Oropharynx and nasopharynx clear.  NECK:  Supple, no jugular venous distention. No thyroid enlargement, no tenderness.  LUNGS: Normal breath sounds bilaterally, no wheezing, rales,rhonchi or crepitation. No use of accessory muscles of respiration.  CARDIOVASCULAR: S1, S2 normal. No murmurs, rubs, or gallops.  ABDOMEN: Soft, nontender, nondistended. Bowel sounds present. No organomegaly or mass.  EXTREMITIES: No pedal edema,  cyanosis, or clubbing.  Patient has right upper extremity AV fistula with good bruit and thrill. NEUROLOGIC: Cranial nerves II through XII are intact. Muscle strength 5/5 in all extremities. Sensation intact. Gait not checked.  PSYCHIATRIC: The patient is alert and oriented x 3.  SKIN: No obvious rash, lesion, or ulcer.   LABORATORY PANEL:   CBC Recent Labs  Lab 09/04/17 1138  WBC 7.2  HGB 13.5  HCT 38.3  PLT 215   ------------------------------------------------------------------------------------------------------------------  Chemistries  Recent Labs  Lab 09/04/17 1138  NA 140  K 4.9  CL 96*  CO2 17*  GLUCOSE 93  BUN 91*  CREATININE 15.22*  CALCIUM 8.2*   ------------------------------------------------------------------------------------------------------------------  Cardiac Enzymes No results for input(s): TROPONINI in the last 168 hours. ------------------------------------------------------------------------------------------------------------------  RADIOLOGY:  Ct Head Wo Contrast  Result Date: 09/04/2017 CLINICAL DATA:  Diabetes. Renal failure. Acute presentation with weakness. EXAM: CT HEAD WITHOUT CONTRAST TECHNIQUE: Contiguous axial images were obtained from the base of the skull through the vertex without intravenous contrast. COMPARISON:  None. FINDINGS: Brain: No sign of acute infarction. There chronic appearing small vessel changes of  the pons and cerebral hemispheric white matter. No mass lesion, hemorrhage, hydrocephalus or extra-axial collection. Vascular: There is atherosclerotic calcification of the major vessels at the base of the brain. Skull: Negative Sinuses/Orbits: Clear/normal Other: None IMPRESSION: No acute finding by CT. Chronic appearing small vessel changes of the pons and cerebral hemispheric white matter. Electronically Signed   By: Nelson Chimes M.D.   On: 09/04/2017 14:56   Dg Chest Portable 1 View  Result Date: 09/04/2017 CLINICAL DATA:  Weakness EXAM: PORTABLE CHEST 1 VIEW COMPARISON:  08/28/2017 FINDINGS: Linear left base atelectasis again noted, similar to prior study. Right lung clear. Prior CABG. No effusions. No acute bony abnormality. IMPRESSION: Stable left base atelectasis. Prior CABG. Electronically Signed   By: Rolm Baptise M.D.   On: 09/04/2017 14:26    EKG:   Orders placed or performed during the hospital encounter of 09/04/17  . ED EKG  . ED EKG   EKG shows normal sinus rhythm at 79 bpm no ST-T changes. IMPRESSION AND PLAN:  62 year old female patient multiple medical problems of essential hypertension, diabetes mellitus type 2, diastolic heart failure, ESRD on hemodialysis comes in with generalized weakness, missed hemodialysis since last Wednesday. 1.  ESRD, patient missed hemodialysis treatments since last Wednesday secondary to feeling generalized weakness, patient does have uremic syndrome with poor appetite, nausea, generalized weakness.  Has been up to 15.  Admitted the patient to hospitalist service, seen by nephrology Dr. Murlean Iba to arrange for dialysis today, #2. diabetes mellitus type 2: Hold Basaglar, continue sliding scale insulin with coverage hold long-acting insulin because of nausea, poor p.o. intake. 3.  UTI: Received Rocephin in the emergency room, continue Rocephin, follow urine cultures, adjust antibiotics. #4 GI, DVT prophylaxis.   All the records are reviewed and  case discussed with ED provider. Management plans discussed with the patient, family and they are in agreement.  CODE STATUS:full  TOTAL TIME TAKING CARE OF THIS PATIENT: 10mnutes.    SEpifanio LeschesM.D on 09/04/2017 at 3:37 PM  Between 7am to 6pm - Pager - [phone]  After 6pm go to [email or web address] - password [email or web address] [facility]  Office  [phone] CC: Primary care physician; SLeone Haven MD  Note: This dictation was prepared with Dragon dictation along with smaller phrase technology. Any transcriptional errors that result from this process are unintentional.

## 2017-09-04 NOTE — Progress Notes (Signed)
[facility], [place] 09/04/17  Subjective:   Patient known to our practice from outpatient dialysis.  She presented to the emergency room for generalized weakness.  Her last dialysis was last Wednesday.  Patient states that after her dialysis treatment last week, she has felt weak.  She has no appetite.  Has not been eating much.  Felt too weak to go to dialysis.  Denies any fevers or chills.  No nausea or vomiting.  No dysuria.  No blood in the urine or stool.  No cough, sputum production or hemoptysis. Last week she was prescribed meclizine in the ER for vertigo.  Since taking that medication, she has felt that she has tremors in her hands.  Objective:  Vital signs in last 24 hours:  Temp:  [97.6 F (36.4 C)] 97.6 F (36.4 C) (07/03 1131) Pulse Rate:  [83-86] 83 (07/03 1431) Resp:  [14-18] 14 (07/03 1431) BP: (151-175)/(70-102) 151/102 (07/03 1431) SpO2:  [98 %-100 %] 98 % (07/03 1431) Weight:  [90.7 kg (200 lb)] 90.7 kg (200 lb) (07/03 1136)  Weight change:  Filed Weights   09/04/17 1136  Weight: 90.7 kg (200 lb)    Intake/Output:   No intake or output data in the 24 hours ending 09/04/17 1440   Physical Exam: General:  No acute distress, laying in the bed   HEENT anicteric, dry oral mucous membranes  Neck  supple, no masses  Pulm/lungs  clear to auscultation bilaterally  CVS/Heart  regular rhythm, no rub or gallop  Abdomen:   Soft, nontender, nondistended  Extremities:  No peripheral edema  Neurologic:  Alert and oriented, tremors noted in both hands  Skin:  No acute rashes  Access:  Right upper arm AV fistula, good bruit and thrill       Basic Metabolic Panel:  Recent Labs  Lab 08/28/17 1445 09/04/17 1138  NA 136 140  K 3.6 4.9  CL 93* 96*  CO2 24 17*  GLUCOSE 94 93  BUN 22 91*  CREATININE 5.37* 15.22*  CALCIUM 9.0 8.2*     CBC: Recent Labs  Lab 08/28/17 1445 09/04/17 1138  WBC 6.7 7.2  NEUTROABS 5.8  --   HGB 12.9  13.5  HCT 38.2 38.3  MCV 94.9 94.2  PLT 148* 215     No results found for: HEPBSAG, HEPBSAB, HEPBIGM    Microbiology:  No results found for this or any previous visit (from the past 240 hour(s)).  Coagulation Studies: No results for input(s): LABPROT, INR in the last 72 hours.  Urinalysis: Recent Labs    09/04/17 1138  COLORURINE AMBER*  LABSPEC 1.014  PHURINE 6.0  GLUCOSEU NEGATIVE  HGBUR MODERATE*  BILIRUBINUR NEGATIVE  KETONESUR 5*  PROTEINUR 100*  NITRITE NEGATIVE  LEUKOCYTESUR LARGE*      Imaging: Dg Chest Portable 1 View  Result Date: 09/04/2017 CLINICAL DATA:  Weakness EXAM: PORTABLE CHEST 1 VIEW COMPARISON:  08/28/2017 FINDINGS: Linear left base atelectasis again noted, similar to prior study. Right lung clear. Prior CABG. No effusions. No acute bony abnormality. IMPRESSION: Stable left base atelectasis. Prior CABG. Electronically Signed   By: Rolm Baptise M.D.   On: 09/04/2017 14:26     Medications:   . cefTRIAXone (ROCEPHIN)  IV      promethazine  Assessment/ Plan:  62 y.o. female with past medical history of diabetes mellitus type 2, hypertension, diastolic heart failure, ESRD on HD since 10/17, GERD, hyperlipidemia, peripheral neuropathy, coronary artery disease   [facility]  [address]/right upper arm AV fistula/225 minutes / 92.5 kg/MWF second shift  1.  End-stage renal disease Patient has missed her dialysis treatments for the last week because of weakness She appears uremic at this time.  We will perform daily dialysis starting with slow short treatments.  No ultrafiltration as patient appears dry. Patient is under her dry weight  2.  Anemia of chronic kidney disease Hemoglobin 13.5 which appears to be hemoconcentration We will hold April for now  3.  Secondary hyperparathyroidism Hold binders for now May restart when patient is able to eat a normal diet  4.  Pyuria Without symptoms Send urine for culture and treat accordingly   LOS:  0 Geral Coker Candiss Norse 7/3/20192:40 PM  [place], [place]  Note: This note was prepared with Dragon dictation. Any transcription errors are unintentional

## 2017-09-04 NOTE — ED Triage Notes (Addendum)
Pt  Comes into the ED via [facility] with her daughter c/o weakness.  Patient is diabetic and a dialysis patient.  Patient states she hasn't been to dialysis in a week because she has felt too bad to go.  Patient denies taking her blood sugar during the course of this.  Patient was seen here last week for dizziness and given meclizine and now she states it is making her have tremors as well.  Patient in NAD with even and unlabored respirations at this time, but is asking her daughter to speak for her.  Patient alert and oriented x4.

## 2017-09-04 NOTE — Progress Notes (Signed)
HD tx end    09/04/17 1835  Vital Signs  Pulse Rate 79  Pulse Rate Source Monitor  Resp 17  BP (!) 146/73  BP Location Left Arm  BP Method Automatic  Patient Position (if appropriate) Lying  Oxygen Therapy  SpO2 100 %  O2 Device Room Air  During Hemodialysis Assessment  Dialysis Fluid Bolus Normal Saline  Bolus Amount (mL) 250 mL  Intra-Hemodialysis Comments Tx completed

## 2017-09-04 NOTE — ED Notes (Signed)
First Nurse Note:  Patient to ED via [facility].  Dialysis patient - last tx was Wed. Of last week.  Complaining of generalized weakness and tremors.  IV left wrist 20ga. EMS reports BP144/76, NSR on EKG.  Has not taken her medicines X 5 days. In [place].

## 2017-09-04 NOTE — ED Notes (Signed)
Report to Jack, RN  

## 2017-09-04 NOTE — Progress Notes (Signed)
Pre HD assessment    09/04/17 1541  Vital Signs  Temp 97.6 F (36.4 C)  Temp Source Oral  Pulse Rate 86  Pulse Rate Source Monitor  Resp 15  BP (!) 125/98  BP Location Left Arm  BP Method Automatic  Patient Position (if appropriate) Lying  Oxygen Therapy  SpO2 97 %  O2 Device Room Air  Pain Assessment  Pain Scale 0-10  Pain Score 0  Dialysis Weight  Weight 90.7 kg (199 lb 15.3 oz)  Type of Weight Pre-Dialysis  Time-Out for Hemodialysis  What Procedure? HD  Pt Identifiers(min of two) First/Last Name;MRN/Account#  Correct Site? Yes  Correct Side? Yes  Correct Procedure? Yes  Consents Verified? Yes  Rad Studies Available? N/A  Safety Precautions Reviewed? Yes  Engineer, civil (consulting) Number  ([id number])  Station Number 1  UF/Alarm Test Passed  Conductivity: Meter 13.8  Conductivity: Machine  14.1  pH 7.6  Reverse Osmosis main  Normal Saline Lot Number [id number]  Dialyzer Lot Number [id number]  Disposable Set Lot Number [id number]  Machine Temperature 98.6 F (37 C)  Musician and Audible Yes  Blood Lines Intact and Secured Yes  Pre Treatment Patient Checks  Vascular access used during treatment Graft  Patient is receiving dialysis in a chair Yes  Hepatitis B Surface Antigen Results Negative  Date Hepatitis B Surface Antigen Drawn 08/14/17  Hepatitis B Surface Antibody 245  Date Hepatitis B Surface Antibody Drawn 08/14/17  Hemodialysis Consent Verified Yes  Hemodialysis Standing Orders Initiated Yes  ECG (Telemetry) Monitor On Yes  Prime Ordered Normal Saline  Length of  DialysisTreatment -hour(s) 2.5 Hour(s)  Dialyzer Elisio 17H NR  Dialysate 3K, 2.5 Ca  Dialysis Anticoagulant None  Dialysate Flow Ordered 500  Blood Flow Rate Ordered 250 mL/min  Ultrafiltration Goal 0 Liters  Pre Treatment Labs CBC;Phosphorus  Dialysis Blood Pressure Support Ordered Normal Saline  Education / Care Plan  Dialysis Education Provided Yes  Documented Education in Care Plan Yes

## 2017-09-04 NOTE — ED Provider Notes (Signed)
[facility] Emergency Department Provider Note    First MD Initiated Contact with Patient 09/04/17 1406     (approximate)  I have reviewed the triage vital signs and the nursing notes.   HISTORY  Chief Complaint Weakness    HPI Jamie Arias is a 62 y.o. female since a past medical history as listed below on dialysis presents ER with chief complaint of generalized weakness and positional dizziness stating that anytime she moves her head she has severe dizziness and blurry vision with nausea.  If she lays still for a while this symptoms resolved.  Denies any numbness or tingling.  States that she is been unable to go to dialysis all week because she is been feeling too weak and too nauseated anytime she moves.  States that she has not been eating or drinking much.  Denies any chest pain.  States that she has been having increased urinary frequency.    Past Medical History:  Diagnosis Date  . Anemia   . Chronic diastolic CHF (congestive heart failure) ([place])    a. Due to ischemic cardiomyopathy. EF as low as 35%, improved to normal s/p CABG; b. echo 07/06/13: EF 55-60%, no RWMAs, mod TR, trivial pericardial effusion not c/w tamponade physiology;  c. 10/2015 Echo: EF 65%, Gr1 DD, triv AI, mild MR, mildly dil LA, mod TR, PASP 52mHg.  .Marland KitchenCoronary artery disease    a. NSTEMI 06/2013; b.cath: severe three-vessel CAD w/ EF 30% & mild-mod MR; c. s/p 3 vessel CABG 07/02/13 (LIMA-LAD, SVG-OM, and SVG-RPDA);  d. 10/2015 MV: no ischemia/infarct.  . Diabetes mellitus without complication ([place]   . Diabetic neuropathy ([place]   . Dialysis patient ([facility]    Tues, Thurs, Sat  . ESRD (end stage renal disease) ([place]    a. 12/2015 initiated TTS dialysis.  .Marland KitchenGERD (gastroesophageal reflux disease)   . Hyperlipidemia   . Hypertension   . Hypothyroidism   . Myocardial infarction ([place] 2015  . Neuropathy   . Pleural effusion 2015  . Pulmonary hypertension ([place]   . Wears dentures    full lower   Family History  Problem Relation Age of Onset  . COPD Mother   . Cancer Mother        Lung  . Pulmonary embolism Father   . Diabetes Father   . Diabetes Paternal Grandfather   . Heart disease Maternal Grandmother   . Colon cancer Neg Hx   . Colon polyps Neg Hx   . Esophageal cancer Neg Hx   . Pancreatic cancer Neg Hx   . Liver disease Neg Hx    Past Surgical History:  Procedure Laterality Date  . A/V FISTULAGRAM Right 12/17/2016   Procedure: A/V Fistulagram;  Surgeon: DAlgernon Huxley MD;  [place];  Service: Cardiovascular;  Laterality: Right;  . A/V FISTULAGRAM Right 01/07/2017   Procedure: A/V Fistulagram;  Surgeon: DAlgernon Huxley MD;  [place] [facility];  Service: Cardiovascular;  Laterality: Right;  . A/V SHUNT INTERVENTION N/A 02/22/2017   Procedure: A/V SHUNT INTERVENTION;  Surgeon: DAlgernon Huxley MD;  [place];  Service: Cardiovascular;  Laterality: N/A;  . AV FISTULA PLACEMENT Right 02/03/2016   Procedure: INSERTION OF ARTERIOVENOUS (AV) GORE-TEX GRAFT ARM ( BRACH / AXILLARY );  Surgeon: GKatha Cabal MD;  Location: [facility] OR[facility];  Service: Vascular;  Laterality: Right;  . CARDIAC CATHETERIZATION    . CATARACT EXTRACTION Bilateral   .  CHOLECYSTECTOMY N/A 12/09/2014   Procedure: LAPAROSCOPIC CHOLECYSTECTOMY;  Surgeon: Marlyce [name], MD;  Location: [facility] OR[facility];  Service: General;  Laterality: N/A;  . CORONARY ARTERY BYPASS GRAFT N/A 07/02/2013   Procedure: CORONARY ARTERY BYPASS GRAFTING (CABG);  Surgeon: Ivin [name], MD;  Location: [place];  Service: Open Heart Surgery;  Laterality: N/A;  CABG x three, using left internal mammary artery and right leg greater saphenous vein harvested endoscopically  . ESOPHAGOGASTRODUODENOSCOPY (EGD) WITH PROPOFOL N/A 11/24/2015   Procedure: ESOPHAGOGASTRODUODENOSCOPY (EGD) WITH PROPOFOL;  Surgeon: [name], MD;  Location: [place];  Service: Endoscopy;   Laterality: N/A;  Diabetic - insulin  . EYE SURGERY Bilateral    Cataract Extraction with IOL  . INTRAOPERATIVE TRANSESOPHAGEAL ECHOCARDIOGRAM N/A 07/02/2013   Procedure: INTRAOPERATIVE TRANSESOPHAGEAL ECHOCARDIOGRAM;  Surgeon: Ivin [name], MD;  Location: [place];  Service: Open Heart Surgery;  Laterality: N/A;  . PERIPHERAL VASCULAR CATHETERIZATION Right 12/06/2015   Procedure: Dialysis/Perma Catheter Insertion;  Surgeon: Katha Cabal, MD;  Location: [place] CV LAB;  Service: Cardiovascular;  Laterality: Right;  . PERIPHERAL VASCULAR THROMBECTOMY Right 09/28/2016   Procedure: Peripheral Vascular Thrombectomy;  Surgeon: Katha Cabal, MD;  Location: [place] CV LAB;  Service: Cardiovascular;  Laterality: Right;  . PORTA CATH REMOVAL N/A 06/01/2016   Procedure: Glori [name] Cath Removal;  Surgeon: Katha Cabal, MD;  Location: [place] [facility] LAB;  Service: Cardiovascular;  Laterality: N/A;  . THORACENTESIS Left 2015   Patient Active Problem List   Diagnosis Date Noted  . Uremia of renal origin 09/04/2017  . Chronic neck pain 07/25/2017  . Hyperkalemia 02/15/2017  . Complication from renal dialysis device 12/01/2016  . ESRD on dialysis ([place]) 01/19/2016  . Nausea   . GI bleed 10/25/2015  . Hypertensive heart disease 10/24/2015  . Chronic diastolic CHF (congestive heart failure) ([place]) 10/24/2015  . Unstable angina ([place]) 10/23/2015  . Low BP 09/02/2015  . Chronic systolic CHF (congestive heart failure) ([place]) 09/02/2015  . Depression 07/27/2015  . Nausea without vomiting 07/07/2015  . Encounter for routine gynecological examination 07/06/2015  . Routine general medical examination at a health care facility 07/06/2015  . Cough 05/25/2015  . Calculus of gallbladder with chronic cholecystitis without obstruction   . Pre-operative cardiovascular examination 11/30/2014  . Bilateral carotid bruits 11/30/2014  . Low magnesium levels 08/10/2013  . Anemia 08/10/2013  .  Constipation 08/10/2013  . Postoperative atrial fibrillation ([place]) 07/30/2013  . Coronary artery disease   . CAD (coronary artery disease) 07/02/2013  . Acute systolic heart failure ([place]) 06/30/2013  . NSTEMI (non-ST elevated myocardial infarction) ([place]) 06/29/2013  . Vertigo 08/25/2012  . Sleep disorder 05/23/2012  . Hypothyroid 04/27/2012  . HTN (hypertension) 04/27/2012  . HLD (hyperlipidemia) 04/27/2012  . Diabetes mellitus, type 2 ([place]) 04/27/2012  . Neuropathy 04/27/2012      Prior to Admission medications   Medication Sig Start Date End Date Taking? Authorizing Provider  acetaminophen (TYLENOL) 325 MG tablet Take 650 mg by mouth daily as needed for moderate pain or headache.    Yes [provider]  aspirin EC 81 MG tablet Take 81 mg by mouth daily.   Yes [provider]  atorvastatin (LIPITOR) 80 MG tablet Take 1 tablet (80 mg total) by mouth daily. 01/31/17  Yes Wellington Hampshire, MD  b complex-vitamin c-folic acid (NEPHRO-VITE) 0.8 MG TABS tablet Take 1 tablet by mouth at bedtime.   Yes [provider]  calcium acetate (PHOSLO) 667 MG capsule  Take 667 mg by mouth 3 (three) times daily with meals.   Yes [provider]  ergocalciferol (VITAMIN D2) 50000 units capsule Take 50,000 Units by mouth once a week.    Yes [provider]  ezetimibe (ZETIA) 10 MG tablet Take 1 tablet (10 mg total) by mouth daily. 07/19/17 10/17/17 Yes Wellington Hampshire, MD  ferrous sulfate 325 (65 FE) MG tablet Take 325 mg by mouth daily with breakfast.   Yes [provider]  furosemide (LASIX) 20 MG tablet Take 20 mg by mouth every other day. Non-dialysis days   Yes [provider]  gabapentin (NEURONTIN) 300 MG capsule TAKE 1 CAPSULE(300 MG) BY MOUTH THREE TIMES DAILY 08/05/17  Yes [name], MD  Insulin Glargine (BASAGLAR KWIKPEN) 100 UNIT/ML SOPN Inject 0.25-0.35 mLs (25-35 Units total) into the skin daily. Patient taking differently:  Inject 28 Units into the skin daily.  07/01/17  Yes [name], MD  levothyroxine (SYNTHROID, LEVOTHROID) 75 MCG tablet TAKE 1 TABLET(75 MCG) BY MOUTH DAILY 08/05/17  Yes [name], MD  lidocaine-prilocaine (EMLA) cream Apply 1 application topically as needed (port access).   Yes [provider]  meclizine (ANTIVERT) 12.5 MG tablet Take 1 tablet (12.5 mg total) by mouth 3 (three) times daily as needed for dizziness. 08/28/17  Yes [name], MD  midodrine (PROAMATINE) 5 MG tablet Take 10 mg by mouth 2 (two) times daily with a meal.    Yes [provider]  nitroGLYCERIN (NITROSTAT) 0.4 MG SL tablet DISSOLVE ONE TABLET UNDER THE TONGUE EVERY 5 MINUTES AS NEEDED FOR CHEST PAIN.  DO NOT EXCEED A TOTAL OF 3 DOSES IN 15 MINUTES 01/18/16  Yes Wellington Hampshire, MD  ondansetron (ZOFRAN-ODT) 4 MG disintegrating tablet Take 4 mg by mouth as needed. 08/23/17  Yes [provider]  traZODone (DESYREL) 50 MG tablet TAKE 1 TABLET BY MOUTH EVERY NIGHT AT BEDTIME 06/05/17  Yes [name], MD  ACCU-CHEK FASTCLIX LANCETS MISC Check blood glucose twice daily, diagnosis code E11.9 12/13/16   [name], MD  blood glucose meter kit and supplies KIT Dispense based on patient and insurance preference. Use up to twice daily. (FOR ICD-10 E11.9) 07/01/15   Rubbie Battiest, RN  Blood Glucose Monitoring Suppl (ACCU-CHEK AVIVA PLUS) w/Device KIT Use as directed to test blood sugar up to three times daily 05/06/17   [name], MD  glucose blood (ACCU-CHEK AVIVA) test strip Use as instructed to test blood sugar up to 3 times daily 05/06/17   [name], MD  Insulin Pen Needle (GLOBAL EASE INJECT PEN NEEDLES) 31G X 5 MM MISC Inject 0.1 mLs (10 Units total) into the skin at bedtime 01/12/16   [name], MD  Lancets (ACCU-CHEK SOFT TOUCH) lancets Use as instructed 05/06/17   [name], MD  Lancets Misc. (ACCU-CHEK SOFTCLIX LANCET DEV) KIT Use as  directed to test blood sugar up to three times daily 05/06/17   [name], MD    Allergies Nsaids and Doxycycline    Social History Social History   Tobacco Use  . Smoking status: Never Smoker  . Smokeless tobacco: Never Used  Substance Use Topics  . Alcohol use: No    Alcohol/week: 0.0 oz  . Drug use: No    Review of Systems Patient denies headaches, rhinorrhea, blurry vision, numbness, shortness of breath, chest pain, edema, cough, abdominal pain, nausea, vomiting, diarrhea, dysuria, fevers, rashes or hallucinations  unless otherwise stated above in HPI. ____________________________________________   PHYSICAL EXAM:  VITAL SIGNS: Vitals:   09/04/17 1553 09/04/17 1615  BP: (!) 149/75 (!) 138/92  Pulse: 88 86  Resp: 18 10  Temp:    SpO2: 98% 96%    Constitutional: Alert and oriented.  Eyes: Conjunctivae are normal.  Head: Atraumatic. Nose: No congestion/rhinnorhea. Mouth/Throat: Mucous membranes are moist.   Neck: No stridor. Painless ROM.  Cardiovascular: Normal rate, regular rhythm. Grossly normal heart sounds.  Good peripheral circulation. Respiratory: Normal respiratory effort.  No retractions. Lungs CTAB. Gastrointestinal: Soft and nontender. No distention. No abdominal bruits. No CVA tenderness. Genitourinary: deferred Musculoskeletal: No lower extremity tenderness nor edema.  No joint effusions. Neurologic:  Normal speech and language.  Leftward inducible nystagmus with worsening of her vertiginous symtpoms No gross focal neurologic deficits are appreciated. No facial droop Skin:  Skin is warm, dry and intact. No rash noted. Psychiatric: Mood and affect are normal. Speech and behavior are normal.  ____________________________________________   LABS (all labs ordered are listed, but only abnormal results are displayed)  Results for orders placed or performed during the hospital encounter of 09/04/17 (from the past 24 hour(s))  Basic metabolic panel      Status: Abnormal   Collection Time: 09/04/17 11:38 AM  Result Value Ref Range   Sodium 140 135 - 145 mmol/L   Potassium 4.9 3.5 - 5.1 mmol/L   Chloride 96 (L) 98 - 111 mmol/L   CO2 17 (L) 22 - 32 mmol/L   Glucose, Bld 93 70 - 99 mg/dL   BUN 91 (H) 8 - 23 mg/dL   Creatinine, Ser 15.22 (H) 0.44 - 1.00 mg/dL   Calcium 8.2 (L) 8.9 - 10.3 mg/dL   GFR calc non Af Amer 2 (L) >60 mL/min   GFR calc Af Amer 3 (L) >60 mL/min   Anion gap 27 (H) 5 - 15  CBC     Status: Abnormal   Collection Time: 09/04/17 11:38 AM  Result Value Ref Range   WBC 7.2 3.6 - 11.0 K/uL   RBC 4.07 3.80 - 5.20 MIL/uL   Hemoglobin 13.5 12.0 - 16.0 g/dL   HCT 38.3 35.0 - 47.0 %   MCV 94.2 80.0 - 100.0 fL   MCH 33.1 26.0 - 34.0 pg   MCHC 35.1 32.0 - 36.0 g/dL   RDW 16.1 (H) 11.5 - 14.5 %   Platelets 215 150 - 440 K/uL  Urinalysis, Complete w Microscopic     Status: Abnormal   Collection Time: 09/04/17 11:38 AM  Result Value Ref Range   Color, Urine AMBER (A) YELLOW   APPearance TURBID (A) CLEAR   Specific Gravity, Urine 1.014 1.005 - 1.030   pH 6.0 5.0 - 8.0   Glucose, UA NEGATIVE NEGATIVE mg/dL   Hgb urine dipstick MODERATE (A) NEGATIVE   Bilirubin Urine NEGATIVE NEGATIVE   Ketones, ur 5 (A) NEGATIVE mg/dL   Protein, ur 100 (A) NEGATIVE mg/dL   Nitrite NEGATIVE NEGATIVE   Leukocytes, UA LARGE (A) NEGATIVE   RBC / HPF 0-5 0 - 5 RBC/hpf   WBC, UA >50 (H) 0 - 5 WBC/hpf   Bacteria, UA MANY (A) NONE SEEN   Squamous Epithelial / LPF >50 (H) 0 - 5   WBC Clumps PRESENT    Mucus PRESENT    ____________________________________________  EKG My review and personal interpretation at Time: 11:47   Indication: dizziness and nausea  Rate: 80  Rhythm: sinus Axis: normal Other: normal  intervals, no stemi ____________________________________________  RADIOLOGY  I personally reviewed all radiographic images ordered to evaluate for the above acute complaints and reviewed radiology reports and findings.  These  findings were personally discussed with the patient.  Please see medical record for radiology report.  ____________________________________________   PROCEDURES  Procedure(s) performed:  .Critical Care Performed by: Merlyn Lot, MD Authorized by: Merlyn Lot, MD   Critical care provider statement:    Critical care time (minutes):  30   Critical care time was exclusive of:  Separately billable procedures and treating other patients   Critical care was necessary to treat or prevent imminent or life-threatening deterioration of the following conditions:  Renal failure   Critical care was time spent personally by me on the following activities:  Development of treatment plan with patient or surrogate, discussions with consultants, evaluation of patient's response to treatment, examination of patient, obtaining history from patient or surrogate, ordering and performing treatments and interventions, ordering and review of laboratory studies, ordering and review of radiographic studies, pulse oximetry, re-evaluation of patient's condition and review of old charts      Critical Care performed: yes ____________________________________________   INITIAL IMPRESSION / [place] / ED COURSE  Pertinent labs & imaging results that were available during my care of the patient were reviewed by me and considered in my medical decision making (see chart for details).   DDX: Vertigo, electrolyte abnormality, renal failure, volume overload, enteritis, UTI, cva  Jamie Arias is a 62 y.o. who presents to the ED with sxs as described above. Patient is AFVSS in ED. Exam as above. Given current presentation have considered the above differential.  Laboratory evaluation with evidence of acute renal failure.  Potassium is normal.  She is otherwise hemodynamically stable.  CT head shows no acute abnormality.  Likely secondary to severe vertigo and probable component of dehydration.  Also  has evidence of UTI which would certainly worsen her vertiginous symptoms we will give IV Rocephin.  Discussed case with Dr. Candiss Norse of nephrology who agrees patient will need urgent dialysis.  Spoke with Dr. Vianne Bulls who kindly agrees to admit patient for further evaluation and management.  Clinical Course as of Sep 04 1632  Wed Sep 04, 2017  1422 Glucose: NEGATIVE [PR]    Clinical Course User Index [PR] Merlyn Lot, MD     As part of my medical decision making, I reviewed the following data within the [facility] notes reviewed and incorporated, Labs reviewed, notes from prior ED visits and [place] Controlled Substance Database   ____________________________________________   FINAL CLINICAL IMPRESSION(S) / ED DIAGNOSES  Final diagnoses:  Weakness  Acute renal failure superimposed on chronic kidney disease, on chronic dialysis, unspecified acute renal failure type ([place])      NEW MEDICATIONS STARTED DURING THIS VISIT:  Current Discharge Medication List       Note:  This document was prepared using Dragon voice recognition software and may include unintentional dictation errors.    Merlyn Lot, MD 09/04/17 1705

## 2017-09-04 NOTE — Progress Notes (Signed)
Post HD assessment. Pt tolerated tx well without c/o or complication. Net UF 25, goal met.    09/04/17 1847  Vital Signs  Temp 97.8 F (36.6 C)  Temp Source Oral  Pulse Rate 78  Pulse Rate Source Monitor  Resp 13  BP (!) 162/72  BP Location Left Arm  BP Method Automatic  Patient Position (if appropriate) Lying  Oxygen Therapy  SpO2 100 %  O2 Device Room Air  Dialysis Weight  Weight 90.5 kg (199 lb 8.3 oz)  Type of Weight Post-Dialysis  Post-Hemodialysis Assessment  Rinseback Volume (mL) 250 mL  KECN 37.2 V  Dialyzer Clearance Lightly streaked  Duration of HD Treatment -hour(s) 2.5 hour(s)  Hemodialysis Intake (mL) 500 mL  UF Total -Machine (mL) 525 mL  Net UF (mL) 25 mL  Tolerated HD Treatment Yes  AVG/AVF Arterial Site Held (minutes) 10 minutes  AVG/AVF Venous Site Held (minutes) 10 minutes  Education / Care Plan  Dialysis Education Provided Yes  Documented Education in Care Plan Yes

## 2017-09-04 NOTE — Progress Notes (Signed)
Post HD assessment    09/04/17 1846  Neurological  Level of Consciousness Alert  Orientation Level Oriented X4  Respiratory  Respiratory Pattern Regular;Unlabored  Chest Assessment Chest expansion symmetrical  Cardiac  ECG Monitor Yes  Vascular  R Radial Pulse +2  L Radial Pulse +2  Edema Generalized  Integumentary  Integumentary (WDL) X  Skin Color Appropriate for ethnicity  Musculoskeletal  Musculoskeletal (WDL) X  Generalized Weakness Yes  Assistive Device None  GU Assessment  Genitourinary (WDL) X  Genitourinary Symptoms  (HD)  Psychosocial  Psychosocial (WDL) WDL

## 2017-09-04 NOTE — ED Notes (Signed)
Taken to dialysis by this RN.

## 2017-09-04 NOTE — Progress Notes (Signed)
HD tx start    09/04/17 1553  Vital Signs  Pulse Rate 88  Pulse Rate Source Monitor  Resp 18  BP (!) 149/75  BP Location Left Arm  BP Method Automatic  Patient Position (if appropriate) Lying  Oxygen Therapy  SpO2 98 %  O2 Device Room Air  During Hemodialysis Assessment  Blood Flow Rate (mL/min) 250 mL/min  Arterial Pressure (mmHg) -90 mmHg  Venous Pressure (mmHg) 250 mmHg  Transmembrane Pressure (mmHg) 70 mmHg  Ultrafiltration Rate (mL/min) 200 mL/min  Dialysate Flow Rate (mL/min) 500 ml/min  Conductivity: Machine  14.2  HD Safety Checks Performed Yes  Dialysis Fluid Bolus Normal Saline  Bolus Amount (mL) 250 mL  Intra-Hemodialysis Comments Tx initiated

## 2017-09-04 NOTE — Progress Notes (Signed)
Pre HD assessment    09/04/17 1542  Neurological  Level of Consciousness Alert  Orientation Level Oriented X4  Respiratory  Respiratory Pattern Regular;Unlabored  Chest Assessment Chest expansion symmetrical  Cardiac  ECG Monitor Yes  Vascular  R Radial Pulse +2  L Radial Pulse +2  Edema Generalized  Integumentary  Integumentary (WDL) X  Skin Color Appropriate for ethnicity  Musculoskeletal  Musculoskeletal (WDL) X  Generalized Weakness Yes  Assistive Device None  GU Assessment  Genitourinary (WDL) X  Genitourinary Symptoms  (HD)  Psychosocial  Psychosocial (WDL) WDL

## 2017-09-05 LAB — URINE CULTURE

## 2017-09-05 LAB — BASIC METABOLIC PANEL
Anion gap: 14 (ref 5–15)
BUN: 47 mg/dL — ABNORMAL HIGH (ref 8–23)
CO2: 27 mmol/L (ref 22–32)
Calcium: 8.4 mg/dL — ABNORMAL LOW (ref 8.9–10.3)
Chloride: 101 mmol/L (ref 98–111)
Creatinine, Ser: 10.14 mg/dL — ABNORMAL HIGH (ref 0.44–1.00)
GFR calc Af Amer: 4 mL/min — ABNORMAL LOW (ref >60)
GFR calc non Af Amer: 4 mL/min — ABNORMAL LOW (ref >60)
Glucose, Bld: 84 mg/dL (ref 70–99)
Potassium: 4.2 mmol/L (ref 3.5–5.1)
Sodium: 142 mmol/L (ref 135–145)

## 2017-09-05 LAB — CBC
HCT: 35.3 % (ref 35.0–47.0)
Hemoglobin: 12.1 g/dL (ref 12.0–16.0)
MCH: 32.6 pg (ref 26.0–34.0)
MCHC: 34.2 g/dL (ref 32.0–36.0)
MCV: 95.5 fL (ref 80.0–100.0)
Platelets: 212 10*3/uL (ref 150–440)
RBC: 3.7 MIL/uL — ABNORMAL LOW (ref 3.80–5.20)
RDW: 16.8 % — ABNORMAL HIGH (ref 11.5–14.5)
WBC: 6.2 10*3/uL (ref 3.6–11.0)

## 2017-09-05 LAB — GLUCOSE, CAPILLARY
Glucose-Capillary: 110 mg/dL — ABNORMAL HIGH (ref 70–99)
Glucose-Capillary: 142 mg/dL — ABNORMAL HIGH (ref 70–99)
Glucose-Capillary: 135 mg/dL — ABNORMAL HIGH (ref 70–99)

## 2017-09-05 MED ORDER — LEVOTHYROXINE SODIUM 50 MCG PO TABS
75.0000 ug | ORAL_TABLET | Freq: Every day | ORAL | Status: DC
  Administered 2017-09-06: 75 ug via ORAL
  Filled 2017-09-05: qty 2
  Filled 2017-09-05: qty 1

## 2017-09-05 MED ORDER — MIDODRINE HCL 5 MG PO TABS
10.0000 mg | ORAL_TABLET | Freq: Two times a day (BID) | ORAL | Status: DC
  Administered 2017-09-05 – 2017-09-06 (×2): 10 mg via ORAL
  Filled 2017-09-05 (×3): qty 2

## 2017-09-05 MED ORDER — TRAZODONE HCL 50 MG PO TABS
50.0000 mg | ORAL_TABLET | Freq: Every day | ORAL | Status: DC
  Administered 2017-09-05: 50 mg via ORAL
  Filled 2017-09-05 (×2): qty 1

## 2017-09-05 NOTE — Progress Notes (Signed)
HD Tx started w/o complication    84/57/33 0919  Vital Signs  Pulse Rate 66  Pulse Rate Source Monitor  Resp 20  BP 137/67  BP Location Left Arm  BP Method Automatic  Patient Position (if appropriate) Lying  Oxygen Therapy  SpO2 100 %  O2 Device Room Air  Pulse Oximetry Type Continuous  During Hemodialysis Assessment  Blood Flow Rate (mL/min) 300 mL/min  Arterial Pressure (mmHg) -190 mmHg  Venous Pressure (mmHg) 120 mmHg  Transmembrane Pressure (mmHg) 70 mmHg  Ultrafiltration Rate (mL/min) 177 mL/min  Dialysate Flow Rate (mL/min) 600 ml/min  Conductivity: Machine  15.2  HD Safety Checks Performed Yes  Dialysis Fluid Bolus Normal Saline  Bolus Amount (mL) 250 mL  Intra-Hemodialysis Comments Tx initiated

## 2017-09-05 NOTE — Progress Notes (Signed)
Post HD assessment, no change from baseline, report given to primary RN, informed pt had BM in unit and will need linens changed.    09/05/17 1230  Neurological  Level of Consciousness Alert  Orientation Level Oriented X4  Respiratory  Respiratory Pattern Regular;Unlabored  Chest Assessment Chest expansion symmetrical  Bilateral Breath Sounds Diminished  Cough None  Cardiac  Pulse Regular  Heart Sounds S1, S2  ECG Monitor Yes  Vascular  R Radial Pulse +2  L Radial Pulse +2  Edema Generalized  Psychosocial  Psychosocial (WDL) WDL

## 2017-09-05 NOTE — Progress Notes (Signed)
HD Tx completed    09/05/17 1220  Hand-Off documentation  Report given to (Full Name) Sunday Corn, RN   Report received from (Full Name) Beatris Ship, RN   Vital Signs  Temp 98.5 F (36.9 C)  Temp Source Oral  Pulse Rate 70  Pulse Rate Source Monitor  During Hemodialysis Assessment  HD Safety Checks Performed Yes  KECN 37.5 KECN  Dialysis Fluid Bolus Normal Saline  Bolus Amount (mL) 250 mL  Intra-Hemodialysis Comments Tolerated well;Tx completed

## 2017-09-05 NOTE — Progress Notes (Signed)
Pre HD Assessment   09/05/17 0910  Neurological  Level of Consciousness Alert  Orientation Level Oriented X4  Respiratory  Respiratory Pattern Regular;Unlabored  Chest Assessment Chest expansion symmetrical  Bilateral Breath Sounds Diminished  Cough None  Cardiac  Pulse Regular  Heart Sounds S1, S2  ECG Monitor Yes  Vascular  R Radial Pulse +2  L Radial Pulse +2  Edema Generalized  Psychosocial  Psychosocial (WDL) WDL

## 2017-09-05 NOTE — Progress Notes (Signed)
Post HD Tx    09/05/17 1225  Vital Signs  Temp 98.5 F (36.9 C)  Temp Source Oral  Pulse Rate 70  Pulse Rate Source Monitor  Resp 15  BP (!) 149/65  BP Location Left Arm  BP Method Automatic  Patient Position (if appropriate) Lying  Oxygen Therapy  SpO2 100 %  O2 Device Room Air  Pulse Oximetry Type Continuous  Post-Hemodialysis Assessment  Rinseback Volume (mL) 250 mL  KECN 37.5 V  Dialyzer Clearance Lightly streaked  Duration of HD Treatment -hour(s) 3 hour(s)  Hemodialysis Intake (mL) 500 mL  UF Total -Machine (mL) 500 mL  Net UF (mL) 0 mL  Tolerated HD Treatment Yes  AVG/AVF Arterial Site Held (minutes)  (10)  AVG/AVF Venous Site Held (minutes) 10 minutes

## 2017-09-05 NOTE — Progress Notes (Addendum)
[facility] NAME: Jamie Arias    MR#:  [id number]  DATE OF BIRTH:  04/29/1955  SUBJECTIVE: Patient seen at bedside, admitted yesterday for tremors, generalized weakness, patient missed hemodialysis sessions since last week and found to have uremia.  Patient had one session of hemodialysis yesterday and 1 today.  Patient states that she feels much better.  CHIEF COMPLAINT:   Chief Complaint  Patient presents with  . Weakness   Her appetite is back and also has decreased tremors. REVIEW OF SYSTEMS:   ROS CONSTITUTIONAL: No fever, fatigue or weakness.  EYES: No blurred or double vision.  EARS, NOSE, AND THROAT: No tinnitus or ear pain.  RESPIRATORY: No cough, shortness of breath, wheezing or hemoptysis.  CARDIOVASCULAR: No chest pain, orthopnea, edema.  GASTROINTESTINAL: No nausea, vomiting, diarrhea or abdominal pain.  GENITOURINARY: No dysuria, hematuria.  ENDOCRINE: No polyuria, nocturia,  HEMATOLOGY: No anemia, easy bruising or bleeding SKIN: No rash or lesion. MUSCULOSKELETAL: No joint pain or arthritis.   NEUROLOGIC: No tingling, numbness, weakness.  PSYCHIATRY: No anxiety or depression.   DRUG ALLERGIES:   Allergies  Allergen Reactions  . Nsaids Other (See Comments)    Contraindicated due to kidney disease.  Marland Kitchen Doxycycline Other (See Comments)    tremor    VITALS:  Blood pressure (!) 132/55, pulse 70, temperature 99 F (37.2 C), temperature source Oral, resp. rate 11, height 5\' 4"  (1.626 m), weight 91 kg (200 lb 9.9 oz), SpO2 99 %.  PHYSICAL EXAMINATION:  GENERAL:  62 y.o.-year-old patient lying in the bed with no acute distress.  EYES: Pupils equal, round, reactive to light and accommodation. No scleral icterus. Extraocular muscles intact.  HEENT: Head atraumatic, normocephalic. Oropharynx and nasopharynx clear.  NECK:  Supple, no jugular venous distention. No thyroid enlargement, no tenderness.  LUNGS:  Normal breath sounds bilaterally, no wheezing, rales,rhonchi or crepitation. No use of accessory muscles of respiration.  CARDIOVASCULAR: S1, S2 normal. No murmurs, rubs, or gallops.  ABDOMEN: Soft, nontender, nondistended. Bowel sounds present. No organomegaly or mass.  EXTREMITIES: No pedal edema, cyanosis, or clubbing.  NEUROLOGIC: Cranial nerves II through XII are intact. Muscle strength 5/5 in all extremities. Sensation intact. Gait not checked.  PSYCHIATRIC: The patient is alert and oriented x 3.  SKIN: No obvious rash, lesion, or ulcer.    LABORATORY PANEL:   CBC Recent Labs  Lab 09/05/17 0436  WBC 6.2  HGB 12.1  HCT 35.3  PLT 212   ------------------------------------------------------------------------------------------------------------------  Chemistries  Recent Labs  Lab 09/05/17 0436  NA 142  K 4.2  CL 101  CO2 27  GLUCOSE 84  BUN 47*  CREATININE 10.14*  CALCIUM 8.4*   ------------------------------------------------------------------------------------------------------------------  Cardiac Enzymes No results for input(s): TROPONINI in the last 168 hours. ------------------------------------------------------------------------------------------------------------------  RADIOLOGY:  Ct Head Wo Contrast  Result Date: 09/04/2017 CLINICAL DATA:  Diabetes. Renal failure. Acute presentation with weakness. EXAM: CT HEAD WITHOUT CONTRAST TECHNIQUE: Contiguous axial images were obtained from the base of the skull through the vertex without intravenous contrast. COMPARISON:  None. FINDINGS: Brain: No sign of acute infarction. There chronic appearing small vessel changes of the pons and cerebral hemispheric white matter. No mass lesion, hemorrhage, hydrocephalus or extra-axial collection. Vascular: There is atherosclerotic calcification of the major vessels at the base of the brain. Skull: Negative Sinuses/Orbits: Clear/normal Other: None IMPRESSION: No acute finding by  CT. Chronic appearing small vessel changes of the pons and cerebral hemispheric white  matter. Electronically Signed   By: Nelson Chimes M.D.   On: 09/04/2017 14:56   Dg Chest Portable 1 View  Result Date: 09/04/2017 CLINICAL DATA:  Weakness EXAM: PORTABLE CHEST 1 VIEW COMPARISON:  08/28/2017 FINDINGS: Linear left base atelectasis again noted, similar to prior study. Right lung clear. Prior CABG. No effusions. No acute bony abnormality. IMPRESSION: Stable left base atelectasis. Prior CABG. Electronically Signed   By: Rolm Baptise M.D.   On: 09/04/2017 14:26    EKG:   Orders placed or performed during the hospital encounter of 09/04/17  . ED EKG  . ED EKG    ASSESSMENT AND PLAN:   62 year old female patient with history of ESRD on hemodialysis missed hemodialysis last Friday, Monday secondary to generalized weakness comes in with nausea, poor p.o. intake, tremors found to have uremia. 1.  ESRD on hemodialysis, presented with tremors and symptoms of poor appetite, admitted for uremia with high creatinine, seen by nephrology, scheduled for hemodialysis yesterday and also today.  Patient feels much better, likely discharge home after hemodialysis tomorrow. 2.  UTI: UA abnormal on admission: Follow urine cultures, continue Rocephin. 3.  Anemia of chronic kidney disease: Follow nephrology recommendation regarding Epogen. 4.  Hypothyroidism: Continue Synthyroid. 5.  Vertigo; patient started on Antivert recently on June 26 that caused her to have tremors so I will DC Antivert. 6.  Chronic hypotension: Patient is on midodrine 10 mg p.o. twice daily. 7.  Diabetes mellitus type 2: P.o. intake improved, started on sliding scale coverage, Lantus was held on admission due to poor p.o. Intake. resume Engineer, agricultural  At discharge...    All the records are reviewed and case discussed with Care Management/Social Workerr. Management plans discussed with the patient, family and they are in agreement.  CODE STATUS:  Full  TOTAL TIME TAKING CARE OF THIS PATIENT: 40 minutes.   POSSIBLE D/C IN 1-2 DAYS, DEPENDING ON CLINICAL CONDITION. More than 50% of time spent in counseling, coordination of care.  Epifanio Lesches M.D on 09/05/2017 at 12:14 PM  Between 7am to 6pm - Pager - [phone]  After 6pm go to [email or web address] - password [email or web address] [facility]  [facility]  Office  [phone]  CC: Primary care physician; [name], MD   Note: This dictation was prepared with Dragon dictation along with smaller phrase technology. Any transcriptional errors that result from this process are unintentional.

## 2017-09-05 NOTE — Progress Notes (Signed)
Pre HD Tx    09/05/17 0915  Vital Signs  Temp 99 F (37.2 C)  Temp Source Oral  Pulse Rate 65  Pulse Rate Source Monitor  Resp 20  BP (!) 163/52  BP Location Left Arm  BP Method Automatic  Patient Position (if appropriate) Lying  Oxygen Therapy  SpO2 100 %  O2 Device Room Air  Pulse Oximetry Type Continuous  Pain Assessment  Pain Scale 0-10  Pain Score 0  Dialysis Weight  Weight 91 kg (200 lb 9.9 oz)  Type of Weight Pre-Dialysis  Time-Out for Hemodialysis  What Procedure? HD  Pt Identifiers(min of two) First/Last Name;MRN/Account#  Correct Site? Yes  Correct Side? Yes  Correct Procedure? Yes  Consents Verified? Yes  Rad Studies Available? N/A  Safety Precautions Reviewed? Yes  Machine [employer] Number [id number]  Station Number 2  UF/Alarm Test Passed  Conductivity: Meter 14  Conductivity: Machine  14  pH 7.2  Reverse Osmosis Main  Normal Saline Lot Number [id number]  Dialyzer Lot Number [id number]  Disposable Set Lot Number [id number]  Machine Temperature 98.6 F (37 C)  Musician and Audible Yes  Blood Lines Intact and Secured Yes  Pre Treatment Patient Checks  Vascular access used during treatment Fistula  Hepatitis B Surface Antigen Results Negative  Date Hepatitis B Surface Antigen Drawn  (08/14/2017)  Hepatitis B Surface Antibody 245  Date Hepatitis B Surface Antibody Drawn 08/14/17  Hemodialysis Consent Verified Yes  Hemodialysis Standing Orders Initiated Yes  ECG (Telemetry) Monitor On Yes  Prime Ordered Normal Saline  Length of  DialysisTreatment -hour(s) 3 Hour(s)  Dialysis Treatment Comments Na 140  Dialyzer Elisio 17H NR  Dialysate 3K, 2.5 Ca  Dialysate Flow Ordered 600  Blood Flow Rate Ordered 300 mL/min  Ultrafiltration Goal 0 Liters  Education / Care Plan  Dialysis Education Provided Yes  Documented Education in Care Plan Yes

## 2017-09-05 NOTE — Progress Notes (Signed)
[facility], [place] 09/05/17  Subjective:   Overall doing better tolerated her dialysis well yesterday   HEMODIALYSIS FLOWSHEET:  Blood Flow Rate (mL/min): 300 mL/min Arterial Pressure (mmHg): -190 mmHg Venous Pressure (mmHg): 120 mmHg Transmembrane Pressure (mmHg): 70 mmHg Ultrafiltration Rate (mL/min): 210 mL/min Dialysate Flow Rate (mL/min): 600 ml/min Conductivity: Machine : 14.2 Conductivity: Machine : 14.2 Dialysis Fluid Bolus: Normal Saline Bolus Amount (mL): 250 mL    Objective:  Vital signs in last 24 hours:  Temp:  [97.6 F (36.4 C)-99 F (37.2 C)] 99 F (37.2 C) (07/04 0915) Pulse Rate:  [65-88] 73 (07/04 1100) Resp:  [10-20] 11 (07/04 1100) BP: (108-174)/(44-102) 109/52 (07/04 1100) SpO2:  [96 %-100 %] 99 % (07/04 1100) Weight:  [90.5 kg (199 lb 8.3 oz)-91 kg (200 lb 9.9 oz)] 91 kg (200 lb 9.9 oz) (07/04 0915)  Weight change:  Filed Weights   09/04/17 1541 09/04/17 1847 09/05/17 0915  Weight: 90.7 kg (199 lb 15.3 oz) 90.5 kg (199 lb 8.3 oz) 91 kg (200 lb 9.9 oz)    Intake/Output:    Intake/Output Summary (Last 24 hours) at 09/05/2017 1132 Last data filed at 09/04/2017 1847 Gross per 24 hour  Intake -  Output 25 ml  Net -25 ml     Physical Exam: General:  No acute distress, laying in the bed   HEENT  anicteric, moist oral mucous membranes  Neck  supple, no masses  Pulm/lungs  clear to auscultation bilaterally  CVS/Heart  regular rhythm, no rub or gallop  Abdomen:   Soft, nontender, nondistended  Extremities:  No peripheral edema  Neurologic:  Alert and oriented, tremors noted in both hands- improving   Skin:  No acute rashes  Access:  Right upper arm AV fistula,         Basic Metabolic Panel:  Recent Labs  Lab 09/04/17 1138 09/04/17 1705 09/05/17 0436  NA 140  --  142  K 4.9  --  4.2  CL 96*  --  101  CO2 17*  --  27  GLUCOSE 93  --  84  BUN 91*  --  47*  CREATININE 15.22* 15.96* 10.14*  CALCIUM 8.2*   --  8.4*  PHOS  --  8.7*  --      CBC: Recent Labs  Lab 09/04/17 1138 09/04/17 1705 09/05/17 0436  WBC 7.2 7.5 6.2  HGB 13.5 12.5 12.1  HCT 38.3 36.8 35.3  MCV 94.2 95.3 95.5  PLT 215 209 212     No results found for: HEPBSAG, HEPBSAB, HEPBIGM    Microbiology:  Recent Results (from the past 240 hour(s))  MRSA PCR Screening     Status: None   Collection Time: 09/04/17  8:11 PM  Result Value Ref Range Status   MRSA by PCR NEGATIVE NEGATIVE Final    Comment:        The GeneXpert MRSA Assay (FDA approved for NASAL specimens only), is one component of a comprehensive MRSA colonization surveillance program. It is not intended to diagnose MRSA infection nor to guide or monitor treatment for MRSA infections. Performed at [facility], [address] [place], [place] [address]     Coagulation Studies: No results for input(s): LABPROT, INR in the last 72 hours.  Urinalysis: Recent Labs    09/04/17 1138  COLORURINE AMBER*  LABSPEC 1.014  PHURINE 6.0  GLUCOSEU NEGATIVE  HGBUR MODERATE*  BILIRUBINUR NEGATIVE  KETONESUR 5*  PROTEINUR 100*  NITRITE NEGATIVE  LEUKOCYTESUR LARGE*      Imaging: Ct Head Wo Contrast  Result Date: 09/04/2017 CLINICAL DATA:  Diabetes. Renal failure. Acute presentation with weakness. EXAM: CT HEAD WITHOUT CONTRAST TECHNIQUE: Contiguous axial images were obtained from the base of the skull through the vertex without intravenous contrast. COMPARISON:  None. FINDINGS: Brain: No sign of acute infarction. There chronic appearing small vessel changes of the pons and cerebral hemispheric white matter. No mass lesion, hemorrhage, hydrocephalus or extra-axial collection. Vascular: There is atherosclerotic calcification of the major vessels at the base of the brain. Skull: Negative Sinuses/Orbits: Clear/normal Other: None IMPRESSION: No acute finding by CT. Chronic appearing small vessel changes of the pons and cerebral hemispheric white  matter. Electronically Signed   By: Nelson Chimes M.D.   On: 09/04/2017 14:56   Dg Chest Portable 1 View  Result Date: 09/04/2017 CLINICAL DATA:  Weakness EXAM: PORTABLE CHEST 1 VIEW COMPARISON:  08/28/2017 FINDINGS: Linear left base atelectasis again noted, similar to prior study. Right lung clear. Prior CABG. No effusions. No acute bony abnormality. IMPRESSION: Stable left base atelectasis. Prior CABG. Electronically Signed   By: Rolm Baptise M.D.   On: 09/04/2017 14:26     Medications:   . cefTRIAXone (ROCEPHIN)  IV     . aspirin EC  81 mg Oral Daily  . atorvastatin  80 mg Oral QPM  . calcium acetate  667 mg Oral TID WC  . Chlorhexidine Gluconate Cloth  6 each Topical Q0600  . docusate sodium  100 mg Oral BID  . ezetimibe  10 mg Oral Daily  . ferrous sulfate  325 mg Oral Q breakfast  . furosemide  20 mg Oral Once per day on Sun Tue Thu Sat  . gabapentin  300 mg Oral TID  . heparin  5,000 Units Subcutaneous Q8H  . insulin aspart  0-9 Units Subcutaneous TID WC  . [START ON 09/09/2017] Vitamin D (Ergocalciferol)  50,000 Units Oral Weekly   bisacodyl, HYDROcodone-acetaminophen, ondansetron **OR** ondansetron (ZOFRAN) IV, promethazine, traZODone  Assessment/ Plan:  62 y.o. female with past medical history of diabetes mellitus type 2, hypertension, diastolic heart failure, ESRD on HD since 10/17, GERD, hyperlipidemia, peripheral neuropathy, coronary artery disease   [facility]  [address]/right upper arm AV fistula/225 minutes / 92.5 kg/MWF second shift  1.  End-stage renal disease Patient has missed her dialysis treatments for the last week because of weakness Daily dialysis for uremia Next HD tomorrow  2.  Anemia of chronic kidney disease Hemoglobin 12.1; above goal We will hold EPO for now  3.  Secondary hyperparathyroidism Hold binders for now May restart when patient is able to eat a normal diet  4.  Pyuria Without symptoms urine culture is pending. Treat accordingly    LOS: [place] 7/4/201911:32 AM  [place], [place]  Note: This note was prepared with Dragon dictation. Any transcription errors are unintentional

## 2017-09-06 LAB — GLUCOSE, CAPILLARY: Glucose-Capillary: 83 mg/dL (ref 70–99)

## 2017-09-06 MED ORDER — AMOXICILLIN-POT CLAVULANATE 875-125 MG PO TABS
1.0000 | ORAL_TABLET | Freq: Two times a day (BID) | ORAL | 0 refills | Status: AC
Start: 2017-09-06 — End: 2017-09-11

## 2017-09-06 NOTE — Progress Notes (Signed)
Patient completed HD. Patient cleared for discharge to home by Dr Vianne Bulls.    Education complete. AVS printed. Discharge instructions given. All questions answered for patient and husbands clarification.  Prescriptions given, pharmacy verified.  IV removed.  Discharged to home Via POV

## 2017-09-06 NOTE — Progress Notes (Signed)
Post dialysis assessment 

## 2017-09-06 NOTE — Progress Notes (Signed)
This note also relates to the following rows which could not be included: Resp - Cannot attach notes to unvalidated device data BP - Cannot attach notes to unvalidated device data SpO2 - Cannot attach notes to unvalidated device data  Hd started

## 2017-09-06 NOTE — Progress Notes (Signed)
[facility], [place] 09/06/17  Subjective:   Overall doing better tolerated her dialysis well yesterday   HEMODIALYSIS FLOWSHEET:  Blood Flow Rate (mL/min): 350 mL/min Arterial Pressure (mmHg): -180 mmHg Venous Pressure (mmHg): 150 mmHg Transmembrane Pressure (mmHg): 50 mmHg Ultrafiltration Rate (mL/min): 140 mL/min Dialysate Flow Rate (mL/min): 800 ml/min Conductivity: Machine : 14 Conductivity: Machine : 14 Dialysis Fluid Bolus: Normal Saline Bolus Amount (mL): 250 mL    Objective:  Vital signs in last 24 hours:  Temp:  [97.7 F (36.5 C)-98.7 F (37.1 C)] 97.8 F (36.6 C) (07/05 0955) Pulse Rate:  [58-96] 58 (07/05 1100) Resp:  [10-24] 16 (07/05 1100) BP: (116-183)/(55-120) 160/62 (07/05 1100) SpO2:  [90 %-100 %] 99 % (07/05 1100)  Weight change: 0.281 kg (9.9 oz) Filed Weights   09/04/17 1541 09/04/17 1847 09/05/17 0915  Weight: 90.7 kg (199 lb 15.3 oz) 90.5 kg (199 lb 8.3 oz) 91 kg (200 lb 9.9 oz)    Intake/Output:    Intake/Output Summary (Last 24 hours) at 09/06/2017 1117 Last data filed at 09/05/2017 1519 Gross per 24 hour  Intake 100 ml  Output 0 ml  Net 100 ml     Physical Exam: General:  No acute distress, sitting in dialysis chair  HEENT  anicteric, moist oral mucous membranes  Neck  supple, no masses  Pulm/lungs  clear to auscultation bilaterally  CVS/Heart  regular rhythm, no rub or gallop  Abdomen:   Soft, nontender, nondistended  Extremities:  No peripheral edema  Neurologic:  Alert and oriented,     Skin:  No acute rashes  Access:  Right upper arm AV fistula,         Basic Metabolic Panel:  Recent Labs  Lab 09/04/17 1138 09/04/17 1705 09/05/17 0436  NA 140  --  142  K 4.9  --  4.2  CL 96*  --  101  CO2 17*  --  27  GLUCOSE 93  --  84  BUN 91*  --  47*  CREATININE 15.22* 15.96* 10.14*  CALCIUM 8.2*  --  8.4*  PHOS  --  8.7*  --      CBC: Recent Labs  Lab 09/04/17 1138 09/04/17 1705  09/05/17 0436  WBC 7.2 7.5 6.2  HGB 13.5 12.5 12.1  HCT 38.3 36.8 35.3  MCV 94.2 95.3 95.5  PLT 215 209 212     No results found for: HEPBSAG, HEPBSAB, HEPBIGM    Microbiology:  Recent Results (from the past 240 hour(s))  Urine Culture     Status: Abnormal   Collection Time: 09/04/17  2:23 PM  Result Value Ref Range Status   Specimen Description   Final    URINE, RANDOM Performed at [facility], [address] [place], [place] [address]    Special Requests   Final    NONE Performed at [facility], [address], [place], [place] [address]    Culture MULTIPLE SPECIES PRESENT, SUGGEST RECOLLECTION (A)  Final   Report Status 09/05/2017 FINAL  Final  MRSA PCR Screening     Status: None   Collection Time: 09/04/17  8:11 PM  Result Value Ref Range Status   MRSA by PCR NEGATIVE NEGATIVE Final    Comment:        The GeneXpert MRSA Assay (FDA approved for NASAL specimens only), is one component of a comprehensive MRSA colonization surveillance program. It is not intended to diagnose MRSA infection nor to guide or monitor treatment  for MRSA infections. Performed at [facility], [address] [place], [place] [address]     Coagulation Studies: No results for input(s): LABPROT, INR in the last 72 hours.  Urinalysis: Recent Labs    09/04/17 1138  COLORURINE AMBER*  LABSPEC 1.014  PHURINE 6.0  GLUCOSEU NEGATIVE  HGBUR MODERATE*  BILIRUBINUR NEGATIVE  KETONESUR 5*  PROTEINUR 100*  NITRITE NEGATIVE  LEUKOCYTESUR LARGE*      Imaging: Ct Head Wo Contrast  Result Date: 09/04/2017 CLINICAL DATA:  Diabetes. Renal failure. Acute presentation with weakness. EXAM: CT HEAD WITHOUT CONTRAST TECHNIQUE: Contiguous axial images were obtained from the base of the skull through the vertex without intravenous contrast. COMPARISON:  None. FINDINGS: Brain: No sign of acute infarction. There chronic appearing small vessel changes of the pons and  cerebral hemispheric white matter. No mass lesion, hemorrhage, hydrocephalus or extra-axial collection. Vascular: There is atherosclerotic calcification of the major vessels at the base of the brain. Skull: Negative Sinuses/Orbits: Clear/normal Other: None IMPRESSION: No acute finding by CT. Chronic appearing small vessel changes of the pons and cerebral hemispheric white matter. Electronically Signed   By: Nelson Chimes M.D.   On: 09/04/2017 14:56   Dg Chest Portable 1 View  Result Date: 09/04/2017 CLINICAL DATA:  Weakness EXAM: PORTABLE CHEST 1 VIEW COMPARISON:  08/28/2017 FINDINGS: Linear left base atelectasis again noted, similar to prior study. Right lung clear. Prior CABG. No effusions. No acute bony abnormality. IMPRESSION: Stable left base atelectasis. Prior CABG. Electronically Signed   By: Rolm Baptise M.D.   On: 09/04/2017 14:26     Medications:   . cefTRIAXone (ROCEPHIN)  IV Stopped (09/05/17 1519)   . aspirin EC  81 mg Oral Daily  . atorvastatin  80 mg Oral QPM  . calcium acetate  667 mg Oral TID WC  . Chlorhexidine Gluconate Cloth  6 each Topical Q0600  . docusate sodium  100 mg Oral BID  . ezetimibe  10 mg Oral Daily  . ferrous sulfate  325 mg Oral Q breakfast  . furosemide  20 mg Oral Once per day on Sun Tue Thu Sat  . gabapentin  300 mg Oral TID  . heparin  5,000 Units Subcutaneous Q8H  . insulin aspart  0-9 Units Subcutaneous TID WC  . levothyroxine  75 mcg Oral QAC breakfast  . midodrine  10 mg Oral BID WC  . traZODone  50 mg Oral QHS  . [START ON 09/09/2017] Vitamin D (Ergocalciferol)  50,000 Units Oral Weekly   bisacodyl, HYDROcodone-acetaminophen, ondansetron **OR** ondansetron (ZOFRAN) IV, promethazine, traZODone  Assessment/ Plan:  62 y.o. female with past medical history of diabetes mellitus type 2, hypertension, diastolic heart failure, ESRD on HD since 10/17, GERD, hyperlipidemia, peripheral neuropathy, coronary artery disease   [facility]  [address]/right  upper arm AV fistula/225 minutes / 92.5 kg/MWF second shift  1.  End-stage renal disease Patient has missed her dialysis treatments for the last week because of weakness Daily dialysis for uremia Seen during HD  Expected to d/c home Resume routine HD as otuaptient Midodrine PRN for intradialytic hypotension  2.  Anemia of chronic kidney disease Hemoglobin 12.1; above goal We will hold EPO for now  3.  Secondary hyperparathyroidism May restart home dose of binders now that patient is able to eat a normal diet  4.  Pyuria Without symptoms urine culture is negative    LOS: 2 Zyrell Carmean Candiss Norse 7/5/201911:17 AM  [place], [place]  Note:  This note was prepared with Dragon dictation. Any transcription errors are unintentional

## 2017-09-06 NOTE — Discharge Summary (Signed)
Jamie Arias, is a 62 y.o. female  DOB [birth year]  MRN [id number].  Admission date:  09/04/2017  Admitting Physician  Murlean Iba, MD  Discharge Date:  09/06/2017   Primary MD  [name], MD  Recommendations for primary care physician for things to follow:   Follow-up with PCP in 1 week Follow-up with Dr. Murlean Iba, follow-up at dialysis Monday, Wednesday, Friday as before.   Admission Diagnosis  Weakness [R53.1] Acute renal failure superimposed on chronic kidney disease, on chronic dialysis, unspecified acute renal failure type ([place]) [N17.9, N18.9, Z99.2]   Discharge Diagnosis  Weakness [R53.1] Acute renal failure superimposed on chronic kidney disease, on chronic dialysis, unspecified acute renal failure type ([place]) [N17.9, N18.9, Z99.2]    Active Problems:   Uremia of renal origin      Past Medical History:  Diagnosis Date  . Anemia   . Chronic diastolic CHF (congestive heart failure) ([place])    a. Due to ischemic cardiomyopathy. EF as low as 35%, improved to normal s/p CABG; b. echo 07/06/13: EF 55-60%, no RWMAs, mod TR, trivial pericardial effusion not c/w tamponade physiology;  c. 10/2015 Echo: EF 65%, Gr1 DD, triv AI, mild MR, mildly dil LA, mod TR, PASP 58mHg.  .Marland KitchenCoronary artery disease    a. NSTEMI 06/2013; b.cath: severe three-vessel CAD w/ EF 30% & mild-mod MR; c. s/p 3 vessel CABG 07/02/13 (LIMA-LAD, SVG-OM, and SVG-RPDA);  d. 10/2015 MV: no ischemia/infarct.  . Diabetes mellitus without complication ([place]   . Diabetic neuropathy ([place]   . Dialysis patient ([facility]    Tues, Thurs, Sat  . ESRD (end stage renal disease) ([place]    a. 12/2015 initiated TTS dialysis.  .Marland KitchenGERD (gastroesophageal reflux disease)   . Hyperlipidemia   . Hypertension   . Hypothyroidism   . Myocardial infarction ([place] 2015  .  Neuropathy   . Pleural effusion 2015  . Pulmonary hypertension ([place]   . Wears dentures    full lower    Past Surgical History:  Procedure Laterality Date  . A/V FISTULAGRAM Right 12/17/2016   Procedure: A/V Fistulagram;  Surgeon: DAlgernon Huxley MD;  [place] [facility];  Service: Cardiovascular;  Laterality: Right;  . A/V FISTULAGRAM Right 01/07/2017   Procedure: A/V Fistulagram;  Surgeon: DAlgernon Huxley MD;  [place] [place];  Service: Cardiovascular;  Laterality: Right;  . A/V SHUNT INTERVENTION N/A 02/22/2017   Procedure: A/V SHUNT INTERVENTION;  Surgeon: DAlgernon Huxley MD;  [place];  Service: Cardiovascular;  Laterality: N/A;  . AV FISTULA PLACEMENT Right 02/03/2016   Procedure: INSERTION OF ARTERIOVENOUS (AV) GORE-TEX GRAFT ARM ( BRACH / AXILLARY );  Surgeon: GKatha Cabal MD;  Location: [facility] ORS;  Service: Vascular;  Laterality: Right;  . CARDIAC CATHETERIZATION    . CATARACT EXTRACTION Bilateral   . CHOLECYSTECTOMY N/A 12/09/2014   Procedure: LAPAROSCOPIC CHOLECYSTECTOMY;  Surgeon: CMarlyce [name] MD;  Location: [facility] ORS;  Service: General;  Laterality: N/A;  . CORONARY ARTERY BYPASS GRAFT N/A 07/02/2013   Procedure: CORONARY ARTERY BYPASS GRAFTING (CABG);  Surgeon: PIvin [name] MD;  [place]  Service: Open Heart Surgery;  Laterality: N/A;  CABG x three, using left internal mammary artery and right leg greater saphenous vein harvested endoscopically  . ESOPHAGOGASTRODUODENOSCOPY (EGD) WITH PROPOFOL N/A 11/24/2015   Procedure: ESOPHAGOGASTRODUODENOSCOPY (EGD) WITH PROPOFOL;  Surgeon: DLucilla [name] MD;  [place]  Service: Endoscopy;  Laterality: N/A;  Diabetic - insulin  . EYE SURGERY Bilateral    Cataract Extraction with IOL  . INTRAOPERATIVE TRANSESOPHAGEAL ECHOCARDIOGRAM N/A 07/02/2013   Procedure: INTRAOPERATIVE TRANSESOPHAGEAL ECHOCARDIOGRAM;  Surgeon: Ivin [name], MD;  Location: [place];   Service: Open Heart Surgery;  Laterality: N/A;  . PERIPHERAL VASCULAR CATHETERIZATION Right 12/06/2015   Procedure: Dialysis/Perma Catheter Insertion;  Surgeon: Katha Cabal, MD;  Location: [place] [facility] LAB;  Service: Cardiovascular;  Laterality: Right;  . PERIPHERAL VASCULAR THROMBECTOMY Right 09/28/2016   Procedure: Peripheral Vascular Thrombectomy;  Surgeon: Katha Cabal, MD;  Location: [place] CV LAB;  Service: Cardiovascular;  Laterality: Right;  . PORTA CATH REMOVAL N/A 06/01/2016   Procedure: Glori [name] Cath Removal;  Surgeon: Katha Cabal, MD;  Location: [place] CV LAB;  Service: Cardiovascular;  Laterality: N/A;  . THORACENTESIS Left 2015       History of present illness and  Hospital Course:     Kindly see H&P for history of present illness and admission details, please review complete Labs, Consult reports and Test reports for all details in brief  HPI  from the history and physical done on the day of admission  62 year old female patient with history of ESRD on hemodialysis Monday, Wednesday, Friday admitted for generalized weakness, missed hemodialysis sessions.  Patient complained of nausea, poor appetite, generalized weakness, tremors when she came.  Hospital Course  #1 nausea, anorexia, tremors secondary to uremia due to missed hemodialysis sessions: Patient creatinine was elevated up to 14, patient missed hemodialysis sessions since last Wednesday because of generalized weakness.  Seen by Dr. Murlean Iba from nephrology in the emergency room, patient went for hemodialysis on the day of admission and also yesterday that is July 4, patient will have routine dialysis today July 5 that is Friday and then after that patient can be discharged home.  Patient feels much better, her nausea, anorexia improved and she is tolerating the diet and she is feeling hungry.  Patient was given meclizine a week ago in the emergency room and she thinks meclizine caused her  to have tremors so will discontinue meclizine.  2.  Chronic diastolic heart failure secondary to ischemic cardiomyopathy 6 on nondialysis days.  With EF 35% status post CABG: Stable.  Patient is on aspirin, beta-blockers, statins: Continue them. 3.  History of diabetes mellitus type 2 without complications patient takes Basaglar 28 units daily/ 4.  ESRD on hemodialysis Monday, Wednesday, Friday. 5.  GERD: Continue PPIs 6 chronic hypotension: Patient takes midodrine 10 mg p.o. twice daily with meals. #7. UTI: Patient UA is abnormal on admission but urine culture showed mixed species: Received IV Rocephin in the hospital, discharged with Augmentin for 5 days.  Discharge Condition: Stable  Follow UP  Follow-up Information    [name], MD. Schedule an appointment as soon as possible for a visit in 1 week(s).   Specialty:  Family Medicine Contact information: [address] [place] [address] [phone]        Murlean Iba, MD. Schedule an appointment as soon as possible for a visit in 4 day(s).   Specialty:  Internal Medicine Contact information: [place] [address] [phone]             Discharge Instructions  and  Discharge Medications      Allergies as of 09/06/2017      Reactions   Nsaids Other (See Comments)   Contraindicated due to kidney disease.   Doxycycline  Other (See Comments)   tremor      Medication List    STOP taking these medications   meclizine 12.5 MG tablet Commonly known as:  ANTIVERT     TAKE these medications   ACCU-CHEK AVIVA PLUS w/Device Kit Use as directed to test blood sugar up to three times daily   ACCU-CHEK FASTCLIX LANCETS Misc Check blood glucose twice daily, diagnosis code E11.9   accu-chek soft touch lancets Use as instructed   ACCU-CHEK SOFTCLIX LANCET DEV Kit Use as directed to test blood sugar up to three times daily   acetaminophen 325 MG  tablet Commonly known as:  TYLENOL Take 650 mg by mouth daily as needed for moderate pain or headache.   amoxicillin-clavulanate 875-125 MG tablet Commonly known as:  AUGMENTIN Take 1 tablet by mouth 2 (two) times daily for 5 days.   aspirin EC 81 MG tablet Take 81 mg by mouth daily.   atorvastatin 80 MG tablet Commonly known as:  LIPITOR Take 1 tablet (80 mg total) by mouth daily.   b complex-vitamin c-folic acid 0.8 MG Tabs tablet Take 1 tablet by mouth at bedtime.   BASAGLAR KWIKPEN 100 UNIT/ML Sopn Inject 0.25-0.35 mLs (25-35 Units total) into the skin daily. What changed:  how much to take   blood glucose meter kit and supplies Kit Dispense based on patient and insurance preference. Use up to twice daily. (FOR ICD-10 E11.9)   calcium acetate 667 MG capsule Commonly known as:  PHOSLO Take 667 mg by mouth 3 (three) times daily with meals.   ergocalciferol 50000 units capsule Commonly known as:  VITAMIN D2 Take 50,000 Units by mouth once a week.   ezetimibe 10 MG tablet Commonly known as:  ZETIA Take 1 tablet (10 mg total) by mouth daily.   ferrous sulfate 325 (65 FE) MG tablet Take 325 mg by mouth daily with breakfast.   furosemide 20 MG tablet Commonly known as:  LASIX Take 20 mg by mouth every other day. Non-dialysis days   gabapentin 300 MG capsule Commonly known as:  NEURONTIN TAKE 1 CAPSULE(300 MG) BY MOUTH THREE TIMES DAILY   glucose blood test strip Commonly known as:  ACCU-CHEK AVIVA Use as instructed to test blood sugar up to 3 times daily   Insulin Pen Needle 31G X 5 MM Misc Commonly known as:  GLOBAL EASE INJECT PEN NEEDLES Inject 0.1 mLs (10 Units total) into the skin at bedtime   levothyroxine 75 MCG tablet Commonly known as:  SYNTHROID, LEVOTHROID TAKE 1 TABLET(75 MCG) BY MOUTH DAILY   lidocaine-prilocaine cream Commonly known as:  EMLA Apply 1 application topically as needed (port access).   midodrine 5 MG tablet Commonly known as:   PROAMATINE Take 10 mg by mouth 2 (two) times daily with a meal.   nitroGLYCERIN 0.4 MG SL tablet Commonly known as:  NITROSTAT DISSOLVE ONE TABLET UNDER THE TONGUE EVERY 5 MINUTES AS NEEDED FOR CHEST PAIN.  DO NOT EXCEED A TOTAL OF 3 DOSES IN 15 MINUTES   ondansetron 4 MG disintegrating tablet Commonly known as:  ZOFRAN-ODT Take 4 mg by mouth as needed.   traZODone 50 MG tablet Commonly known as:  DESYREL TAKE 1 TABLET BY MOUTH EVERY NIGHT AT BEDTIME         Diet and Activity recommendation: See Discharge Instructions above   Consults obtained -nephrology   Major procedures and Radiology Reports - PLEASE review detailed and final reports for all details, in brief -  Ct Head Wo Contrast  Result Date: 09/04/2017 CLINICAL DATA:  Diabetes. Renal failure. Acute presentation with weakness. EXAM: CT HEAD WITHOUT CONTRAST TECHNIQUE: Contiguous axial images were obtained from the base of the skull through the vertex without intravenous contrast. COMPARISON:  None. FINDINGS: Brain: No sign of acute infarction. There chronic appearing small vessel changes of the pons and cerebral hemispheric white matter. No mass lesion, hemorrhage, hydrocephalus or extra-axial collection. Vascular: There is atherosclerotic calcification of the major vessels at the base of the brain. Skull: Negative Sinuses/Orbits: Clear/normal Other: None IMPRESSION: No acute finding by CT. Chronic appearing small vessel changes of the pons and cerebral hemispheric white matter. Electronically Signed   By: Nelson Chimes M.D.   On: 09/04/2017 14:56   Dg Chest Portable 1 View  Result Date: 09/04/2017 CLINICAL DATA:  Weakness EXAM: PORTABLE CHEST 1 VIEW COMPARISON:  08/28/2017 FINDINGS: Linear left base atelectasis again noted, similar to prior study. Right lung clear. Prior CABG. No effusions. No acute bony abnormality. IMPRESSION: Stable left base atelectasis. Prior CABG. Electronically Signed   By: Rolm Baptise M.D.   On:  09/04/2017 14:26   Dg Chest Portable 1 View  Result Date: 08/28/2017 CLINICAL DATA:  Dizziness EXAM: PORTABLE CHEST 1 VIEW COMPARISON:  Chest radiograph 05/14/2017 FINDINGS: Remote median sternotomy and CABG. Persistent left basilar opacity, likely atelectasis. No focal consolidation or pulmonary edema. No pneumothorax or sizable pleural effusion. IMPRESSION: Left basilar atelectasis without acute airspace disease. Electronically Signed   By: Ulyses Jarred M.D.   On: 08/28/2017 15:09    Micro Results     Recent Results (from the past 240 hour(s))  Urine Culture     Status: Abnormal   Collection Time: 09/04/17  2:23 PM  Result Value Ref Range Status   Specimen Description   Final    URINE, RANDOM Performed at [facility], [address] [place], [place] [address]    Special Requests   Final    NONE Performed at [facility], [address] [place], [place] [address]    Culture MULTIPLE SPECIES PRESENT, SUGGEST RECOLLECTION (A)  Final   Report Status 09/05/2017 FINAL  Final  MRSA PCR Screening     Status: None   Collection Time: 09/04/17  8:11 PM  Result Value Ref Range Status   MRSA by PCR NEGATIVE NEGATIVE Final    Comment:        The GeneXpert MRSA Assay (FDA approved for NASAL specimens only), is one component of a comprehensive MRSA colonization surveillance program. It is not intended to diagnose MRSA infection nor to guide or monitor treatment for MRSA infections. Performed at [facility], [address] [place], [place] [address]        Today   Subjective:   Malayshia All today has no headache,no chest abdominal pain,no new weakness tingling or numbness, feels much better wants to go home today.   Objective:   Blood pressure (!) 116/58, pulse 67, temperature 97.7 F (36.5 C), temperature source Oral, resp. rate 18, height _0  (1.626 m), weight 91 kg (200 lb 9.9 oz), SpO2 98 %.   Intake/Output Summary (Last 24 hours)  at 09/06/2017 0939 Last data filed at 09/05/2017 1519 Gross per 24 hour  Intake 100 ml  Output 0 ml  Net 100 ml    Exam Awake Alert, Oriented x 3, No new F.N deficits, Normal affect .AT,PERRAL Supple Neck,No JVD, No cervical lymphadenopathy appriciated.  Symmetrical Chest wall movement, Good air movement  bilaterally, CTAB RRR,No Gallops,Rubs or new Murmurs, No Parasternal Heave +ve B.Sounds, Abd Soft, Non tender, No organomegaly appriciated, No rebound -guarding or rigidity. No Cyanosis, Clubbing or edema, No new Rash or bruise  Data Review   CBC w Diff:  Lab Results  Component Value Date   WBC 6.2 09/05/2017   HGB 12.1 09/05/2017   HGB 10.5 (L) 07/23/2013   HCT 35.3 09/05/2017   HCT 31.6 (L) 07/23/2013   PLT 212 09/05/2017   PLT 275 07/23/2013   LYMPHOPCT 7 08/28/2017   LYMPHOPCT 15.2 07/19/2013   BANDSPCT 0 02/22/2017   MONOPCT 5 08/28/2017   MONOPCT 7.0 07/19/2013   EOSPCT 0 08/28/2017   EOSPCT 0.2 07/19/2013   BASOPCT 1 08/28/2017   BASOPCT 0.7 07/19/2013    CMP:  Lab Results  Component Value Date   NA 142 09/05/2017   NA 140 03/30/2014   K 4.2 09/05/2017   K 4.3 03/30/2014   CL 101 09/05/2017   CL 105 03/30/2014   CO2 27 09/05/2017   CO2 29 03/30/2014   BUN 47 (H) 09/05/2017   BUN 21 (H) 03/30/2014   CREATININE 10.14 (H) 09/05/2017   CREATININE 3.14 (H) 09/09/2015   GLU 187 03/30/2014   PROT 7.2 07/16/2017   PROT 7.0 07/23/2013   ALBUMIN 4.2 07/16/2017   ALBUMIN 2.7 (L) 07/23/2013   BILITOT 0.4 07/16/2017   BILITOT 0.7 07/23/2013   ALKPHOS 72 07/16/2017   ALKPHOS 145 (H) 07/23/2013   AST 26 07/16/2017   AST 16 07/23/2013   ALT 15 07/16/2017   ALT 15 07/23/2013  .   Total Time in preparing paper work, data evaluation and todays exam - 35 minutes  Epifanio Lesches M.D on 09/06/2017 at 9:39 AM    Note: This dictation was prepared with Dragon dictation along with smaller phrase technology. Any transcriptional errors that result from this  process are unintentional.

## 2017-09-06 NOTE — Care Management Important Message (Signed)
Copy of signed IM left in patient's room.    

## 2017-09-06 NOTE — Progress Notes (Signed)
Pre dialysis assessment 

## 2017-09-06 NOTE — Care Management (Signed)
Amanda Morris dialysis liaison notified of discharge  

## 2017-09-06 NOTE — Progress Notes (Signed)
Hd completed 

## 2017-09-10 ENCOUNTER — Encounter: Payer: Self-pay | Admitting: Family Medicine

## 2017-09-10 DIAGNOSIS — E1122 Type 2 diabetes mellitus with diabetic chronic kidney disease: Secondary | ICD-10-CM | POA: Diagnosis not present

## 2017-09-10 DIAGNOSIS — N186 End stage renal disease: Secondary | ICD-10-CM | POA: Diagnosis not present

## 2017-09-10 DIAGNOSIS — D509 Iron deficiency anemia, unspecified: Secondary | ICD-10-CM | POA: Diagnosis not present

## 2017-09-10 DIAGNOSIS — N2581 Secondary hyperparathyroidism of renal origin: Secondary | ICD-10-CM | POA: Diagnosis not present

## 2017-09-10 DIAGNOSIS — D631 Anemia in chronic kidney disease: Secondary | ICD-10-CM | POA: Diagnosis not present

## 2017-09-11 DIAGNOSIS — N2581 Secondary hyperparathyroidism of renal origin: Secondary | ICD-10-CM | POA: Diagnosis not present

## 2017-09-11 DIAGNOSIS — N186 End stage renal disease: Secondary | ICD-10-CM | POA: Diagnosis not present

## 2017-09-11 DIAGNOSIS — D631 Anemia in chronic kidney disease: Secondary | ICD-10-CM | POA: Diagnosis not present

## 2017-09-11 DIAGNOSIS — D509 Iron deficiency anemia, unspecified: Secondary | ICD-10-CM | POA: Diagnosis not present

## 2017-09-11 DIAGNOSIS — E1122 Type 2 diabetes mellitus with diabetic chronic kidney disease: Secondary | ICD-10-CM | POA: Diagnosis not present

## 2017-09-12 ENCOUNTER — Inpatient Hospital Stay: Payer: Medicare Other | Admitting: Family Medicine

## 2017-09-13 DIAGNOSIS — N186 End stage renal disease: Secondary | ICD-10-CM | POA: Diagnosis not present

## 2017-09-13 DIAGNOSIS — D631 Anemia in chronic kidney disease: Secondary | ICD-10-CM | POA: Diagnosis not present

## 2017-09-13 DIAGNOSIS — N2581 Secondary hyperparathyroidism of renal origin: Secondary | ICD-10-CM | POA: Diagnosis not present

## 2017-09-13 DIAGNOSIS — D509 Iron deficiency anemia, unspecified: Secondary | ICD-10-CM | POA: Diagnosis not present

## 2017-09-13 DIAGNOSIS — E1122 Type 2 diabetes mellitus with diabetic chronic kidney disease: Secondary | ICD-10-CM | POA: Diagnosis not present

## 2017-09-16 DIAGNOSIS — D509 Iron deficiency anemia, unspecified: Secondary | ICD-10-CM | POA: Diagnosis not present

## 2017-09-16 DIAGNOSIS — E1122 Type 2 diabetes mellitus with diabetic chronic kidney disease: Secondary | ICD-10-CM | POA: Diagnosis not present

## 2017-09-16 DIAGNOSIS — D631 Anemia in chronic kidney disease: Secondary | ICD-10-CM | POA: Diagnosis not present

## 2017-09-16 DIAGNOSIS — N186 End stage renal disease: Secondary | ICD-10-CM | POA: Diagnosis not present

## 2017-09-16 DIAGNOSIS — N2581 Secondary hyperparathyroidism of renal origin: Secondary | ICD-10-CM | POA: Diagnosis not present

## 2017-09-17 ENCOUNTER — Ambulatory Visit (INDEPENDENT_AMBULATORY_CARE_PROVIDER_SITE_OTHER): Payer: Medicare Other | Admitting: Family Medicine

## 2017-09-17 ENCOUNTER — Encounter: Payer: Self-pay | Admitting: Family Medicine

## 2017-09-17 DIAGNOSIS — N19 Unspecified kidney failure: Secondary | ICD-10-CM

## 2017-09-17 DIAGNOSIS — I2510 Atherosclerotic heart disease of native coronary artery without angina pectoris: Secondary | ICD-10-CM

## 2017-09-17 DIAGNOSIS — J309 Allergic rhinitis, unspecified: Secondary | ICD-10-CM | POA: Diagnosis not present

## 2017-09-17 DIAGNOSIS — I959 Hypotension, unspecified: Secondary | ICD-10-CM

## 2017-09-17 MED ORDER — FLUTICASONE PROPIONATE 50 MCG/ACT NA SUSP: 2.0000 | Freq: Every day | NASAL | 6 refills | Status: AC

## 2017-09-17 NOTE — Patient Instructions (Addendum)
I am glad the symptoms you were hospitalized with have been improving.   Please continue to go to dialysis. Your headache potentially could be related to allergies.  I do not believe you have a sinus infection.  We will trial Flonase in addition to your Claritin.  Please use Tylenol as needed for headaches.   If you are not improving with this please let us know.    you develop fever or discharge from your nose please be evaluated.  You were orthostatic today with a laying blood pressure of 100/60, sitting blood pressure 84/46, and standing blood pressure 76/42.  Please show these numbers to your nephrologist when you go to dialysis tomorrow.

## 2017-09-17 NOTE — Progress Notes (Signed)
[name], MD Phone: [phone]  Jamie Arias is a 62 y.o. female who presents today for f/u.  CC: Hospital follow-up, dizzy/lightheaded, headache  Hospitalized due to missing dialysis for several sessions.  She had nausea, decreased appetite, and tremors.  She did dialysis 3 times in the hospital and improved with those symptoms.  Her appetite has been better.  Tremors she had have improved quite a bit.  She has not had any nausea.  Patient reports she continues to feel lightheaded when she stands up.  Also has some dizziness and vertigo sensation.  That occurs less frequently than the lightheadedness.  She has had no syncope. She is no longer on midodrine. States nephrology stopped this.   Patient reports frontal and top of her head headache that has been going on for a number of weeks.  She also has pressure in her left ear.  Pressure in her frontal sinuses and slightly in her maxillary sinuses.  She has had no numbness or weakness.  She has mild postnasal drip.  Is not blowing anything out of her nose.  No fevers.  Some mild cough.  No tinnitus.  No sneezing.  Her eyes do water and crust.  No itching.  Tylenol has helped with a headache and it resolves completely when she takes a Tylenol.  Social History   Tobacco Use  Smoking Status Never Smoker  Smokeless Tobacco Never Used     ROS see history of present illness  Objective  Physical Exam Vitals:   09/17/17 1030  BP: 108/60  Pulse: 80  Temp: 98.5 F (36.9 C)  SpO2: 98%   Laying blood pressure 100/60 pulse 75 Sitting blood pressure 84/46 pulse 81 Standing blood pressure 76/42 pulse 78  BP Readings from Last 3 Encounters:  09/17/17 108/60  09/06/17 (!) 160/66  08/28/17 (!) 133/56   Wt Readings from Last 3 Encounters:  09/17/17 208 lb 12.8 oz (94.7 kg)  09/05/17 200 lb 9.9 oz (91 kg)  08/28/17 200 lb (90.7 kg)    Physical Exam  Constitutional: No distress.  HENT:  Head: Normocephalic and atraumatic.    Nose: Nose normal.  Mouth/Throat: Oropharynx is clear and moist.  Eyes: Pupils are equal, round, and reactive to light. Conjunctivae are normal.  Cardiovascular: Normal rate, regular rhythm and normal heart sounds.  Pulmonary/Chest: Effort normal and breath sounds normal.  Musculoskeletal: She exhibits no edema.  Neurological: She is alert.  CN 2-12 intact, 5/5 strength in bilateral biceps, triceps, grip, quads, hamstrings, plantar and dorsiflexion, sensation to light touch intact in bilateral UE and LE  Skin: Skin is warm and dry. She is not diaphoretic.     Assessment/Plan: Please see individual problem list.  Low BP Blood pressure is normal in the office though she is quite orthostatic which I think could be contributing to her dizziness and lightheadedness.  I provided her with her orthostatic readings so she could show them to dialysis and her kidney specialist to see if she needs to go back onto Midodrin versus altering her dialysis regarding her fluid status.  Uremia of renal origin Patients uremic symptoms have resolved.  She will continue dialysis.  Allergic rhinitis Symptoms including headache potentially related to allergic rhinitis.  Will trial Flonase in addition to the Claritin that she is taking.  If not improving she will let us know.   No orders of the defined types were placed in this encounter.   Meds ordered this encounter  Medications  . fluticasone (FLONASE)  50 MCG/ACT nasal spray    Sig: Place 2 sprays into both nostrils daily.    Dispense:  16 g    Refill:  [place], MD [place]

## 2017-09-18 DIAGNOSIS — N186 End stage renal disease: Secondary | ICD-10-CM | POA: Diagnosis not present

## 2017-09-18 DIAGNOSIS — D631 Anemia in chronic kidney disease: Secondary | ICD-10-CM | POA: Diagnosis not present

## 2017-09-18 DIAGNOSIS — E1122 Type 2 diabetes mellitus with diabetic chronic kidney disease: Secondary | ICD-10-CM | POA: Diagnosis not present

## 2017-09-18 DIAGNOSIS — N2581 Secondary hyperparathyroidism of renal origin: Secondary | ICD-10-CM | POA: Diagnosis not present

## 2017-09-18 DIAGNOSIS — D509 Iron deficiency anemia, unspecified: Secondary | ICD-10-CM | POA: Diagnosis not present

## 2017-09-19 DIAGNOSIS — J309 Allergic rhinitis, unspecified: Secondary | ICD-10-CM | POA: Insufficient documentation

## 2017-09-19 NOTE — Assessment & Plan Note (Signed)
Patients uremic symptoms have resolved.  She will continue dialysis.

## 2017-09-19 NOTE — Assessment & Plan Note (Signed)
Symptoms including headache potentially related to allergic rhinitis.  Will trial Flonase in addition to the Claritin that she is taking.  If not improving she will let us know.

## 2017-09-19 NOTE — Assessment & Plan Note (Signed)
Blood pressure is normal in the office though she is quite orthostatic which I think could be contributing to her dizziness and lightheadedness.  I provided her with her orthostatic readings so she could show them to dialysis and her kidney specialist to see if she needs to go back onto Midodrin versus altering her dialysis regarding her fluid status.

## 2017-09-20 DIAGNOSIS — D509 Iron deficiency anemia, unspecified: Secondary | ICD-10-CM | POA: Diagnosis not present

## 2017-09-20 DIAGNOSIS — D631 Anemia in chronic kidney disease: Secondary | ICD-10-CM | POA: Diagnosis not present

## 2017-09-20 DIAGNOSIS — N186 End stage renal disease: Secondary | ICD-10-CM | POA: Diagnosis not present

## 2017-09-20 DIAGNOSIS — E1122 Type 2 diabetes mellitus with diabetic chronic kidney disease: Secondary | ICD-10-CM | POA: Diagnosis not present

## 2017-09-20 DIAGNOSIS — N2581 Secondary hyperparathyroidism of renal origin: Secondary | ICD-10-CM | POA: Diagnosis not present

## 2017-09-23 DIAGNOSIS — N2581 Secondary hyperparathyroidism of renal origin: Secondary | ICD-10-CM | POA: Diagnosis not present

## 2017-09-23 DIAGNOSIS — D631 Anemia in chronic kidney disease: Secondary | ICD-10-CM | POA: Diagnosis not present

## 2017-09-23 DIAGNOSIS — E1122 Type 2 diabetes mellitus with diabetic chronic kidney disease: Secondary | ICD-10-CM | POA: Diagnosis not present

## 2017-09-23 DIAGNOSIS — D509 Iron deficiency anemia, unspecified: Secondary | ICD-10-CM | POA: Diagnosis not present

## 2017-09-23 DIAGNOSIS — N186 End stage renal disease: Secondary | ICD-10-CM | POA: Diagnosis not present

## 2017-09-24 ENCOUNTER — Other Ambulatory Visit: Payer: Self-pay | Admitting: Family Medicine

## 2017-09-25 DIAGNOSIS — N2581 Secondary hyperparathyroidism of renal origin: Secondary | ICD-10-CM | POA: Diagnosis not present

## 2017-09-25 DIAGNOSIS — E1122 Type 2 diabetes mellitus with diabetic chronic kidney disease: Secondary | ICD-10-CM | POA: Diagnosis not present

## 2017-09-25 DIAGNOSIS — N186 End stage renal disease: Secondary | ICD-10-CM | POA: Diagnosis not present

## 2017-09-25 DIAGNOSIS — D631 Anemia in chronic kidney disease: Secondary | ICD-10-CM | POA: Diagnosis not present

## 2017-09-25 DIAGNOSIS — D509 Iron deficiency anemia, unspecified: Secondary | ICD-10-CM | POA: Diagnosis not present

## 2017-09-26 ENCOUNTER — Other Ambulatory Visit: Payer: Self-pay | Admitting: Unknown Physician Specialty

## 2017-09-26 DIAGNOSIS — R42 Dizziness and giddiness: Secondary | ICD-10-CM

## 2017-09-26 DIAGNOSIS — E108 Type 1 diabetes mellitus with unspecified complications: Secondary | ICD-10-CM | POA: Diagnosis not present

## 2017-09-27 DIAGNOSIS — E1122 Type 2 diabetes mellitus with diabetic chronic kidney disease: Secondary | ICD-10-CM | POA: Diagnosis not present

## 2017-09-27 DIAGNOSIS — N2581 Secondary hyperparathyroidism of renal origin: Secondary | ICD-10-CM | POA: Diagnosis not present

## 2017-09-27 DIAGNOSIS — N186 End stage renal disease: Secondary | ICD-10-CM | POA: Diagnosis not present

## 2017-09-27 DIAGNOSIS — D631 Anemia in chronic kidney disease: Secondary | ICD-10-CM | POA: Diagnosis not present

## 2017-09-27 DIAGNOSIS — D509 Iron deficiency anemia, unspecified: Secondary | ICD-10-CM | POA: Diagnosis not present

## 2017-09-30 DIAGNOSIS — D509 Iron deficiency anemia, unspecified: Secondary | ICD-10-CM | POA: Diagnosis not present

## 2017-09-30 DIAGNOSIS — E1122 Type 2 diabetes mellitus with diabetic chronic kidney disease: Secondary | ICD-10-CM | POA: Diagnosis not present

## 2017-09-30 DIAGNOSIS — D631 Anemia in chronic kidney disease: Secondary | ICD-10-CM | POA: Diagnosis not present

## 2017-09-30 DIAGNOSIS — N2581 Secondary hyperparathyroidism of renal origin: Secondary | ICD-10-CM | POA: Diagnosis not present

## 2017-09-30 DIAGNOSIS — N186 End stage renal disease: Secondary | ICD-10-CM | POA: Diagnosis not present

## 2017-10-02 DIAGNOSIS — N186 End stage renal disease: Secondary | ICD-10-CM | POA: Diagnosis not present

## 2017-10-02 DIAGNOSIS — Z992 Dependence on renal dialysis: Secondary | ICD-10-CM | POA: Diagnosis not present

## 2017-10-02 DIAGNOSIS — E1122 Type 2 diabetes mellitus with diabetic chronic kidney disease: Secondary | ICD-10-CM | POA: Diagnosis not present

## 2017-10-02 DIAGNOSIS — N2581 Secondary hyperparathyroidism of renal origin: Secondary | ICD-10-CM | POA: Diagnosis not present

## 2017-10-02 DIAGNOSIS — D631 Anemia in chronic kidney disease: Secondary | ICD-10-CM | POA: Diagnosis not present

## 2017-10-02 DIAGNOSIS — D509 Iron deficiency anemia, unspecified: Secondary | ICD-10-CM | POA: Diagnosis not present

## 2017-10-03 DIAGNOSIS — R42 Dizziness and giddiness: Secondary | ICD-10-CM | POA: Diagnosis not present

## 2017-10-04 DIAGNOSIS — E1122 Type 2 diabetes mellitus with diabetic chronic kidney disease: Secondary | ICD-10-CM | POA: Diagnosis not present

## 2017-10-04 DIAGNOSIS — N186 End stage renal disease: Secondary | ICD-10-CM | POA: Diagnosis not present

## 2017-10-04 DIAGNOSIS — N2581 Secondary hyperparathyroidism of renal origin: Secondary | ICD-10-CM | POA: Diagnosis not present

## 2017-10-04 DIAGNOSIS — D631 Anemia in chronic kidney disease: Secondary | ICD-10-CM | POA: Diagnosis not present

## 2017-10-07 DIAGNOSIS — E1122 Type 2 diabetes mellitus with diabetic chronic kidney disease: Secondary | ICD-10-CM | POA: Diagnosis not present

## 2017-10-07 DIAGNOSIS — D631 Anemia in chronic kidney disease: Secondary | ICD-10-CM | POA: Diagnosis not present

## 2017-10-07 DIAGNOSIS — N2581 Secondary hyperparathyroidism of renal origin: Secondary | ICD-10-CM | POA: Diagnosis not present

## 2017-10-07 DIAGNOSIS — N186 End stage renal disease: Secondary | ICD-10-CM | POA: Diagnosis not present

## 2017-10-09 DIAGNOSIS — E1122 Type 2 diabetes mellitus with diabetic chronic kidney disease: Secondary | ICD-10-CM | POA: Diagnosis not present

## 2017-10-09 DIAGNOSIS — D631 Anemia in chronic kidney disease: Secondary | ICD-10-CM | POA: Diagnosis not present

## 2017-10-09 DIAGNOSIS — N2581 Secondary hyperparathyroidism of renal origin: Secondary | ICD-10-CM | POA: Diagnosis not present

## 2017-10-09 DIAGNOSIS — N186 End stage renal disease: Secondary | ICD-10-CM | POA: Diagnosis not present

## 2017-10-10 ENCOUNTER — Other Ambulatory Visit: Payer: Self-pay | Admitting: Unknown Physician Specialty

## 2017-10-10 ENCOUNTER — Ambulatory Visit
Admission: RE | Admit: 2017-10-10 | Discharge: 2017-10-10 | Disposition: A | Discharge status: Home / Self Care | Payer: Medicare Other | Source: Ambulatory Visit | Attending: Unknown Physician Specialty | Admitting: Unknown Physician Specialty

## 2017-10-10 DIAGNOSIS — R42 Dizziness and giddiness: Secondary | ICD-10-CM

## 2017-10-10 DIAGNOSIS — R9082 White matter disease, unspecified: Secondary | ICD-10-CM | POA: Diagnosis not present

## 2017-10-11 DIAGNOSIS — E1122 Type 2 diabetes mellitus with diabetic chronic kidney disease: Secondary | ICD-10-CM | POA: Diagnosis not present

## 2017-10-11 DIAGNOSIS — N2581 Secondary hyperparathyroidism of renal origin: Secondary | ICD-10-CM | POA: Diagnosis not present

## 2017-10-11 DIAGNOSIS — N186 End stage renal disease: Secondary | ICD-10-CM | POA: Diagnosis not present

## 2017-10-11 DIAGNOSIS — D631 Anemia in chronic kidney disease: Secondary | ICD-10-CM | POA: Diagnosis not present

## 2017-10-15 DIAGNOSIS — N2581 Secondary hyperparathyroidism of renal origin: Secondary | ICD-10-CM | POA: Diagnosis not present

## 2017-10-15 DIAGNOSIS — D631 Anemia in chronic kidney disease: Secondary | ICD-10-CM | POA: Diagnosis not present

## 2017-10-15 DIAGNOSIS — N186 End stage renal disease: Secondary | ICD-10-CM | POA: Diagnosis not present

## 2017-10-15 DIAGNOSIS — E1122 Type 2 diabetes mellitus with diabetic chronic kidney disease: Secondary | ICD-10-CM | POA: Diagnosis not present

## 2017-10-16 DIAGNOSIS — N2581 Secondary hyperparathyroidism of renal origin: Secondary | ICD-10-CM | POA: Diagnosis not present

## 2017-10-16 DIAGNOSIS — D631 Anemia in chronic kidney disease: Secondary | ICD-10-CM | POA: Diagnosis not present

## 2017-10-16 DIAGNOSIS — E1122 Type 2 diabetes mellitus with diabetic chronic kidney disease: Secondary | ICD-10-CM | POA: Diagnosis not present

## 2017-10-16 DIAGNOSIS — N186 End stage renal disease: Secondary | ICD-10-CM | POA: Diagnosis not present

## 2017-10-18 DIAGNOSIS — N2581 Secondary hyperparathyroidism of renal origin: Secondary | ICD-10-CM | POA: Diagnosis not present

## 2017-10-18 DIAGNOSIS — D631 Anemia in chronic kidney disease: Secondary | ICD-10-CM | POA: Diagnosis not present

## 2017-10-18 DIAGNOSIS — E1122 Type 2 diabetes mellitus with diabetic chronic kidney disease: Secondary | ICD-10-CM | POA: Diagnosis not present

## 2017-10-18 DIAGNOSIS — N186 End stage renal disease: Secondary | ICD-10-CM | POA: Diagnosis not present

## 2017-10-22 DIAGNOSIS — E1122 Type 2 diabetes mellitus with diabetic chronic kidney disease: Secondary | ICD-10-CM | POA: Diagnosis not present

## 2017-10-22 DIAGNOSIS — N2581 Secondary hyperparathyroidism of renal origin: Secondary | ICD-10-CM | POA: Diagnosis not present

## 2017-10-22 DIAGNOSIS — N186 End stage renal disease: Secondary | ICD-10-CM | POA: Diagnosis not present

## 2017-10-22 DIAGNOSIS — D631 Anemia in chronic kidney disease: Secondary | ICD-10-CM | POA: Diagnosis not present

## 2017-10-23 DIAGNOSIS — N186 End stage renal disease: Secondary | ICD-10-CM | POA: Diagnosis not present

## 2017-10-23 DIAGNOSIS — D631 Anemia in chronic kidney disease: Secondary | ICD-10-CM | POA: Diagnosis not present

## 2017-10-23 DIAGNOSIS — E1122 Type 2 diabetes mellitus with diabetic chronic kidney disease: Secondary | ICD-10-CM | POA: Diagnosis not present

## 2017-10-23 DIAGNOSIS — N2581 Secondary hyperparathyroidism of renal origin: Secondary | ICD-10-CM | POA: Diagnosis not present

## 2017-10-24 ENCOUNTER — Encounter: Payer: Self-pay | Admitting: Family Medicine

## 2017-10-25 ENCOUNTER — Telehealth: Payer: Self-pay | Admitting: *Deleted

## 2017-10-25 DIAGNOSIS — N186 End stage renal disease: Secondary | ICD-10-CM | POA: Diagnosis not present

## 2017-10-25 DIAGNOSIS — D631 Anemia in chronic kidney disease: Secondary | ICD-10-CM | POA: Diagnosis not present

## 2017-10-25 DIAGNOSIS — E1122 Type 2 diabetes mellitus with diabetic chronic kidney disease: Secondary | ICD-10-CM | POA: Diagnosis not present

## 2017-10-25 DIAGNOSIS — N2581 Secondary hyperparathyroidism of renal origin: Secondary | ICD-10-CM | POA: Diagnosis not present

## 2017-10-25 NOTE — Telephone Encounter (Signed)
Left a message for the patient to call back.   She is passed due of her fasting lipid and liver. She can go to the [facility] for fasting labs.

## 2017-10-28 DIAGNOSIS — N186 End stage renal disease: Secondary | ICD-10-CM | POA: Diagnosis not present

## 2017-10-28 DIAGNOSIS — N2581 Secondary hyperparathyroidism of renal origin: Secondary | ICD-10-CM | POA: Diagnosis not present

## 2017-10-28 DIAGNOSIS — E1122 Type 2 diabetes mellitus with diabetic chronic kidney disease: Secondary | ICD-10-CM | POA: Diagnosis not present

## 2017-10-28 DIAGNOSIS — D631 Anemia in chronic kidney disease: Secondary | ICD-10-CM | POA: Diagnosis not present

## 2017-10-29 ENCOUNTER — Ambulatory Visit: Payer: Medicare Other | Admitting: Family Medicine

## 2017-10-30 DIAGNOSIS — D631 Anemia in chronic kidney disease: Secondary | ICD-10-CM | POA: Diagnosis not present

## 2017-10-30 DIAGNOSIS — N186 End stage renal disease: Secondary | ICD-10-CM | POA: Diagnosis not present

## 2017-10-30 DIAGNOSIS — N2581 Secondary hyperparathyroidism of renal origin: Secondary | ICD-10-CM | POA: Diagnosis not present

## 2017-10-30 DIAGNOSIS — E1122 Type 2 diabetes mellitus with diabetic chronic kidney disease: Secondary | ICD-10-CM | POA: Diagnosis not present

## 2017-11-01 DIAGNOSIS — N2581 Secondary hyperparathyroidism of renal origin: Secondary | ICD-10-CM | POA: Diagnosis not present

## 2017-11-01 DIAGNOSIS — N186 End stage renal disease: Secondary | ICD-10-CM | POA: Diagnosis not present

## 2017-11-01 DIAGNOSIS — E1122 Type 2 diabetes mellitus with diabetic chronic kidney disease: Secondary | ICD-10-CM | POA: Diagnosis not present

## 2017-11-01 DIAGNOSIS — D631 Anemia in chronic kidney disease: Secondary | ICD-10-CM | POA: Diagnosis not present

## 2017-11-01 NOTE — Telephone Encounter (Signed)
Left a message for the patient to remind her to her fasting lipid and livers completed.

## 2017-11-02 DIAGNOSIS — N186 End stage renal disease: Secondary | ICD-10-CM | POA: Diagnosis not present

## 2017-11-02 DIAGNOSIS — Z992 Dependence on renal dialysis: Secondary | ICD-10-CM | POA: Diagnosis not present

## 2017-11-04 DIAGNOSIS — E1122 Type 2 diabetes mellitus with diabetic chronic kidney disease: Secondary | ICD-10-CM | POA: Diagnosis not present

## 2017-11-04 DIAGNOSIS — D509 Iron deficiency anemia, unspecified: Secondary | ICD-10-CM | POA: Diagnosis not present

## 2017-11-04 DIAGNOSIS — D631 Anemia in chronic kidney disease: Secondary | ICD-10-CM | POA: Diagnosis not present

## 2017-11-04 DIAGNOSIS — N2581 Secondary hyperparathyroidism of renal origin: Secondary | ICD-10-CM | POA: Diagnosis not present

## 2017-11-04 DIAGNOSIS — N186 End stage renal disease: Secondary | ICD-10-CM | POA: Diagnosis not present

## 2017-11-06 DIAGNOSIS — D509 Iron deficiency anemia, unspecified: Secondary | ICD-10-CM | POA: Diagnosis not present

## 2017-11-06 DIAGNOSIS — D631 Anemia in chronic kidney disease: Secondary | ICD-10-CM | POA: Diagnosis not present

## 2017-11-06 DIAGNOSIS — E1122 Type 2 diabetes mellitus with diabetic chronic kidney disease: Secondary | ICD-10-CM | POA: Diagnosis not present

## 2017-11-06 DIAGNOSIS — N2581 Secondary hyperparathyroidism of renal origin: Secondary | ICD-10-CM | POA: Diagnosis not present

## 2017-11-06 DIAGNOSIS — N186 End stage renal disease: Secondary | ICD-10-CM | POA: Diagnosis not present

## 2017-11-07 ENCOUNTER — Other Ambulatory Visit
Admission: RE | Admit: 2017-11-07 | Discharge: 2017-11-07 | Disposition: A | Discharge status: Home / Self Care | Payer: Medicare Other | Source: Ambulatory Visit | Attending: Cardiovascular Disease | Admitting: Cardiovascular Disease

## 2017-11-07 DIAGNOSIS — Z79899 Other long term (current) drug therapy: Secondary | ICD-10-CM | POA: Diagnosis not present

## 2017-11-07 DIAGNOSIS — E785 Hyperlipidemia, unspecified: Secondary | ICD-10-CM

## 2017-11-07 LAB — LIPID PANEL
Cholesterol: 162 mg/dL (ref 0–200)
HDL: 48 mg/dL (ref >40)
LDL Cholesterol: 92 mg/dL (ref 0–99)
Total CHOL/HDL Ratio: 3.4 RATIO
Triglycerides: 110 mg/dL (ref <150)
VLDL: 22 mg/dL (ref 0–40)

## 2017-11-07 LAB — HEPATIC FUNCTION PANEL
ALT: 17 U/L (ref 0–44)
AST: 22 U/L (ref 15–41)
Albumin: 3.7 g/dL (ref 3.5–5.0)
Alkaline Phosphatase: 56 U/L (ref 38–126)
Bilirubin, Direct: <0.1 mg/dL (ref 0.0–0.2)
Total Bilirubin: 0.7 mg/dL (ref 0.3–1.2)
Total Protein: 7.1 g/dL (ref 6.5–8.1)

## 2017-11-08 DIAGNOSIS — N186 End stage renal disease: Secondary | ICD-10-CM | POA: Diagnosis not present

## 2017-11-08 DIAGNOSIS — D509 Iron deficiency anemia, unspecified: Secondary | ICD-10-CM | POA: Diagnosis not present

## 2017-11-08 DIAGNOSIS — N2581 Secondary hyperparathyroidism of renal origin: Secondary | ICD-10-CM | POA: Diagnosis not present

## 2017-11-08 DIAGNOSIS — E1122 Type 2 diabetes mellitus with diabetic chronic kidney disease: Secondary | ICD-10-CM | POA: Diagnosis not present

## 2017-11-08 DIAGNOSIS — D631 Anemia in chronic kidney disease: Secondary | ICD-10-CM | POA: Diagnosis not present

## 2017-11-11 DIAGNOSIS — D509 Iron deficiency anemia, unspecified: Secondary | ICD-10-CM | POA: Diagnosis not present

## 2017-11-11 DIAGNOSIS — N2581 Secondary hyperparathyroidism of renal origin: Secondary | ICD-10-CM | POA: Diagnosis not present

## 2017-11-11 DIAGNOSIS — N186 End stage renal disease: Secondary | ICD-10-CM | POA: Diagnosis not present

## 2017-11-11 DIAGNOSIS — D631 Anemia in chronic kidney disease: Secondary | ICD-10-CM | POA: Diagnosis not present

## 2017-11-11 DIAGNOSIS — E1122 Type 2 diabetes mellitus with diabetic chronic kidney disease: Secondary | ICD-10-CM | POA: Diagnosis not present

## 2017-11-12 ENCOUNTER — Ambulatory Visit (INDEPENDENT_AMBULATORY_CARE_PROVIDER_SITE_OTHER): Payer: Medicare Other | Admitting: Nurse Practitioner

## 2017-11-12 ENCOUNTER — Encounter

## 2017-11-12 ENCOUNTER — Encounter: Payer: Self-pay | Admitting: Nurse Practitioner

## 2017-11-12 VITALS — BP 128/68 | HR 64 | Ht 64.0 in | Wt 211.3 lb

## 2017-11-12 DIAGNOSIS — E785 Hyperlipidemia, unspecified: Secondary | ICD-10-CM | POA: Diagnosis not present

## 2017-11-12 DIAGNOSIS — N186 End stage renal disease: Secondary | ICD-10-CM | POA: Diagnosis not present

## 2017-11-12 DIAGNOSIS — I5032 Chronic diastolic (congestive) heart failure: Secondary | ICD-10-CM

## 2017-11-12 DIAGNOSIS — I2510 Atherosclerotic heart disease of native coronary artery without angina pectoris: Secondary | ICD-10-CM

## 2017-11-12 DIAGNOSIS — I951 Orthostatic hypotension: Secondary | ICD-10-CM

## 2017-11-12 MED ORDER — ALIROCUMAB 75 MG/ML ~~LOC~~ SOPN: 75.0000 mg | PEN_INJECTOR | SUBCUTANEOUS | 11 refills | Status: DC

## 2017-11-12 MED ORDER — MIDODRINE HCL 5 MG PO TABS: 5.0000 mg | ORAL_TABLET | Freq: Two times a day (BID) | ORAL | 3 refills | Status: DC

## 2017-11-12 NOTE — Progress Notes (Signed)
Office Visit    Patient Name: Jamie Arias Date of Encounter: 11/12/2017  Primary Care Provider:  [name], MD Primary Cardiologist:  Kathlyn Sacramento, MD  Chief Complaint    62 year old female with a history of CAD, HFpEF, diabetes, hypertension, hyperlipidemia, GERD, hypothyroidism, pulmonary hypertension, and end-stage renal disease on dialysis, who presents for follow-up related to orthostatic hypotension.  Past Medical History    Past Medical History:  Diagnosis Date  . Anemia   . Chronic diastolic CHF (congestive heart failure) ([place])    a. Due to ischemic cardiomyopathy. EF as low as 35%, improved to normal s/p CABG; b. echo 07/06/13: EF 55-60%, no RWMAs, mod TR, trivial pericardial effusion not c/w tamponade physiology;  c. 10/2015 Echo: EF 65%, Gr1 DD, triv AI, mild MR, mildly dil LA, mod TR, PASP 36mHg.  .Marland KitchenCoronary artery disease    a. NSTEMI 06/2013; b.cath: severe three-vessel CAD w/ EF 30% & mild-mod MR; c. s/p 3 vessel CABG 07/02/13 (LIMA-LAD, SVG-OM, and SVG-RPDA);  d. 10/2015 MV: no ischemia/infarct.  . Diabetes mellitus without complication ([facility]   . Diabetic neuropathy ([place]   . Dialysis patient ([facility]    MWF  . ESRD (end stage renal disease) ([facility]    a. 12/2015 initiated - mwf dialysis.  .Marland KitchenGERD (gastroesophageal reflux disease)   . Hyperlipidemia   . Hypertension   . Hypothyroidism   . Myocardial infarction ([place] 2015  . Neuropathy   . Pleural effusion 2015  . Pulmonary hypertension ([place]   . Wears dentures    full lower   Past Surgical History:  Procedure Laterality Date  . A/V FISTULAGRAM Right 12/17/2016   Procedure: A/V Fistulagram;  Surgeon: DAlgernon Huxley MD;  [place] [facility] [place];  Service: Cardiovascular;  Laterality: Right;  . A/V FISTULAGRAM Right 01/07/2017   Procedure: A/V Fistulagram;  Surgeon: DAlgernon Huxley MD;  [place] [facility];  Service: Cardiovascular;  Laterality: Right;  . A/V SHUNT INTERVENTION N/A  02/22/2017   Procedure: A/V SHUNT INTERVENTION;  Surgeon: DAlgernon Huxley MD;  [place] [facility];  Service: Cardiovascular;  Laterality: N/A;  . AV FISTULA PLACEMENT Right 02/03/2016   Procedure: INSERTION OF ARTERIOVENOUS (AV) GORE-TEX GRAFT ARM ( BRACH / AXILLARY );  Surgeon: GKatha Cabal MD;  Location: [facility];  Service: Vascular;  Laterality: Right;  . CARDIAC CATHETERIZATION    . CATARACT EXTRACTION Bilateral   . CHOLECYSTECTOMY N/A 12/09/2014   Procedure: LAPAROSCOPIC CHOLECYSTECTOMY;  Surgeon: CMarlyce [name] MD;  Location: [facility];  Service: General;  Laterality: N/A;  . CORONARY ARTERY BYPASS GRAFT N/A 07/02/2013   Procedure: CORONARY ARTERY BYPASS GRAFTING (CABG);  Surgeon: PIvin [name] MD;  [place]  Service: Open Heart Surgery;  Laterality: N/A;  CABG x three, using left internal mammary artery and right leg greater saphenous vein harvested endoscopically  . ESOPHAGOGASTRODUODENOSCOPY (EGD) WITH PROPOFOL N/A 11/24/2015   Procedure: ESOPHAGOGASTRODUODENOSCOPY (EGD) WITH PROPOFOL;  Surgeon: DLucilla [name] MD;  [place]  Service: Endoscopy;  Laterality: N/A;  Diabetic - insulin  . EYE SURGERY Bilateral    Cataract Extraction with IOL  . INTRAOPERATIVE TRANSESOPHAGEAL ECHOCARDIOGRAM N/A 07/02/2013   Procedure: INTRAOPERATIVE TRANSESOPHAGEAL ECHOCARDIOGRAM;  Surgeon: PIvin [name] MD;  [place]  Service: Open Heart Surgery;  Laterality: N/A;  . PERIPHERAL VASCULAR CATHETERIZATION Right 12/06/2015   Procedure: Dialysis/Perma Catheter Insertion;  Surgeon: GKatha Cabal MD;  [place] [facility];  Service: Cardiovascular;  Laterality: Right;  . PERIPHERAL VASCULAR THROMBECTOMY Right 09/28/2016   Procedure: Peripheral Vascular Thrombectomy;  Surgeon: Katha Cabal, MD;  Location: [place] [facility];  Service: Cardiovascular;  Laterality: Right;  . PORTA CATH REMOVAL N/A 06/01/2016   Procedure: Glori [name] Cath  Removal;  Surgeon: Katha Cabal, MD;  Location: [place] [facility];  Service: Cardiovascular;  Laterality: N/A;  . THORACENTESIS Left 2015    Allergies  Allergies  Allergen Reactions  . Nsaids Other (See Comments)    Contraindicated due to kidney disease.  Marland Kitchen Doxycycline Other (See Comments)    tremor    History of Present Illness    62 year old female with the above complex past medical history including coronary artery disease status post non-STEMI in April 2015 with subsequent CABG x3, HFpEF with an EF of 65% in August 2017, hypertension, hyperlipidemia, type 2 diabetes mellitus, end-stage renal disease on Tuesday, Thursday, Saturday dialysis, GERD, hypothyroidism, pulmonary hypertension, and orthostatic hypotension.  Dialysis was started in October 2017 and she is currently going Monday, Wednesday, Friday.  She has had issues with orthostasis before and has been on Midrin up to 10 mg twice daily.  When she was last seen here in May, she was having orthostatic lightheadedness and advised to resume midodrine.  She did this but then pressures were higher in the mornings, sometimes into the 150s and she was asked to use it on an as-needed basis only.  She had been taking it on dialysis days only but more recently, she has been having more orthostasis with standing on nondialysis days.  Typically, she experiences mild lightheadedness after standing.  This can persist for several minutes prior to resolving.  Very bothersome for her and she says it limits her activity because she knows she needs to go out for walks but does not for fever of feeling lightheaded or potentially falling.  She has not been having any chest pain, dyspnea, PND, orthopnea, syncope, edema, or early satiety.    Home Medications    Prior to Admission medications   Medication Sig Start Date End Date Taking? Authorizing Provider  ACCU-CHEK FASTCLIX LANCETS MISC Check blood glucose twice daily, diagnosis code E11.9  12/13/16  Yes [name], MD  acetaminophen (TYLENOL) 325 MG tablet Take 650 mg by mouth daily as needed for moderate pain or headache.    Yes [provider]  aspirin EC 81 MG tablet Take 81 mg by mouth daily.   Yes [provider]  atorvastatin (LIPITOR) 80 MG tablet Take 1 tablet (80 mg total) by mouth daily. 01/31/17  Yes Wellington Hampshire, MD  b complex-vitamin c-folic acid (NEPHRO-VITE) 0.8 MG TABS tablet Take 1 tablet by mouth at bedtime.   Yes [provider]  blood glucose meter kit and supplies KIT Dispense based on patient and insurance preference. Use up to twice daily. (FOR ICD-10 E11.9) 07/01/15  Yes Doss, Velora Heckler, RN  Blood Glucose Monitoring Suppl (ACCU-CHEK AVIVA PLUS) w/Device KIT Use as directed to test blood sugar up to three times daily 05/06/17  Yes [name], MD  calcium acetate (PHOSLO) 667 MG capsule Take 667 mg by mouth 3 (three) times daily with meals.   Yes [provider]  Calcium Acetate 668 (169 Ca) MG TABS TAKE 3 TABLETS BY MOUTH THREE TIMES DAILY WITH MEALS 09/04/17  Yes [provider]  ergocalciferol (VITAMIN D2) 50000 units capsule Take 50,000 Units by mouth once a week.  Yes [provider]  ferrous sulfate 325 (65 FE) MG tablet Take 325 mg by mouth daily with breakfast.   Yes [provider]  fluticasone (FLONASE) 50 MCG/ACT nasal spray Place 2 sprays into both nostrils daily. 09/17/17  Yes [name], MD  furosemide (LASIX) 20 MG tablet Take 20 mg by mouth every other day. Non-dialysis days   Yes [provider]  gabapentin (NEURONTIN) 300 MG capsule TAKE 1 CAPSULE(300 MG) BY MOUTH THREE TIMES DAILY 09/24/17  Yes [name], MD  glucose blood (ACCU-CHEK AVIVA) test strip Use as instructed to test blood sugar up to 3 times daily 05/06/17  Yes [name], MD  Insulin Glargine (BASAGLAR KWIKPEN) 100 UNIT/ML SOPN Inject 0.25-0.35 mLs (25-35 Units total) into the  skin daily. Patient taking differently: Inject 28 Units into the skin daily.  07/01/17  Yes [name], MD  Insulin Pen Needle (GLOBAL EASE INJECT PEN NEEDLES) 31G X 5 MM MISC Inject 0.1 mLs (10 Units total) into the skin at bedtime 01/12/16  Yes [name], MD  Lancets (ACCU-CHEK SOFT TOUCH) lancets Use as instructed 05/06/17  Yes [name], MD  Lancets Misc. (ACCU-CHEK SOFTCLIX LANCET DEV) KIT Use as directed to test blood sugar up to three times daily 05/06/17  Yes [name], MD  levothyroxine (SYNTHROID, LEVOTHROID) 75 MCG tablet TAKE 1 TABLET(75 MCG) BY MOUTH DAILY 08/05/17  Yes [name], MD  lidocaine-prilocaine (EMLA) cream Apply 1 application topically as needed (port access).   Yes [provider]  midodrine (PROAMATINE) 5 MG tablet Take 10 mg by mouth 2 (two) times daily with a meal.    Yes [provider]  nitroGLYCERIN (NITROSTAT) 0.4 MG SL tablet DISSOLVE ONE TABLET UNDER THE TONGUE EVERY 5 MINUTES AS NEEDED FOR CHEST PAIN.  DO NOT EXCEED A TOTAL OF 3 DOSES IN 15 MINUTES 01/18/16  Yes Wellington Hampshire, MD  ondansetron (ZOFRAN-ODT) 4 MG disintegrating tablet Take 4 mg by mouth as needed. 08/23/17  Yes [provider]  traZODone (DESYREL) 50 MG tablet TAKE 1 TABLET BY MOUTH EVERY NIGHT AT BEDTIME 06/05/17  Yes [name], MD  ezetimibe (ZETIA) 10 MG tablet Take 1 tablet (10 mg total) by mouth daily. 07/19/17 10/17/17  Wellington Hampshire, MD    Review of Systems    Orthostatic lightheadedness as noted above.  She denies chest pain, palpitations, dyspnea, PND, orthopnea, syncope, edema, or early satiety.  All other systems reviewed and are otherwise negative except as noted above.  Physical Exam    VS:  BP 128/68 (BP Location: Left Arm, Patient Position: Sitting, Cuff Size: Large)   Pulse 64   Ht '5\' 4"'$  (1.626 m)   Wt 211 lb 5 oz (95.9 kg)   BMI 36.27 kg/m  , BMI Body mass index is 36.27 kg/m. GEN: Well nourished,  well developed, in no acute distress. HEENT: normal. Neck: Supple, no JVD, carotid bruits, or masses. Cardiac: RRR, no murmurs, rubs, or gallops. No clubbing, cyanosis, edema.  Radials/DP/PT 2+ and equal bilaterally.  Respiratory:  Respirations regular and unlabored, clear to auscultation bilaterally. GI: Soft, nontender, nondistended, BS + x 4. MS: no deformity or atrophy. Skin: warm and dry, no rash. Neuro:  Strength and sensation are intact. Psych: Normal affect.  Accessory Clinical Findings    ECG personally reviewed by me today -sinus arrhythmia, 64, no acute ST or T changes- no acute changes.  Lab Results  Component Value  Date   CHOL 162 11/07/2017   HDL 48 11/07/2017   LDLCALC 92 11/07/2017   LDLDIRECT 126.9 10/31/2012   TRIG 110 11/07/2017   CHOLHDL 3.4 11/07/2017    Lab Results  Component Value Date   ALT 17 11/07/2017   AST 22 11/07/2017   ALKPHOS 56 11/07/2017   BILITOT 0.7 11/07/2017     Assessment & Plan    1.  Orthostatic hypotension: Patient with a history of hypotension and orthostasis previously taking midodrine 10 mg twice daily.  She is not currently taking any Midodrine currently and has recurrent orthostasis even on nondialysis days.  Medications reviewed and she will start taking midodrine 5 mg twice daily.  We discussed the importance of dangling her legs first thing in the morning and also considering wearing TED stockings.  She does not typically experience lower extremity edema.  2.  Coronary artery disease: Status post prior bypass with subsequent negative stress testing in August 2017.  She has not been having any chest pain or dyspnea.  She remains on aspirin, statin, and Zetia therapy.  3.  Hyperlipidemia: She had labs drawn in May, at which time her LDL was 91 she was advised to start taking Zetia.  She says that she has been taking Zetia 10 mg in addition to atorvastatin 80 mg.  She had follow-up labs on September 5 and her LDL is 92.  I told her  I have never seen this before, where there is just no response to Zetia whatsoever.  She is certain she is taking it.  Her husband confirms this as he is usually the one that prepares her medicines.  In this setting, we will look into getting praluent 75 q 2 wks for her.  If she is able to obtain this, we would stop Zetia.  4.  HFpEF: Stable.  Volume management per nephrology/HD.  5.  End-stage renal disease: Stable on Monday, Wednesday, Friday dialysis.  6.  Disposition: Follow-up in 3 months or sooner if necessary.  Murray Hodgkins, NP 11/12/2017, 3:40 PM

## 2017-11-12 NOTE — Patient Instructions (Signed)
Medication Instructions:  Your physician has recommended you make the following change in your medication:  1- TAKE Midodrine 5 mg by mouth two times a day. 2- Praluent 75 mg injected every 14 days.  This prescription may require a prior authorization. It may take some time to get the medication from your pharmacy.    Labwork: NONE  Testing/Procedures: NONE  Follow-Up: Your physician recommends that you schedule a follow-up appointment in: [place].  If you need a refill on your cardiac medications before your next appointment, please call your pharmacy.    Alirocumab injection What is this medicine? ALIROCUMAB (al i ROC ue mab) is known as a PCSK9 inhibitor. It is used to lower the level of cholesterol in the blood. This medicine is only for patients whose cholesterol is not controlled by diet and statin therapy. This medicine may be used for other purposes; ask your health care provider or pharmacist if you have questions. COMMON BRAND NAME(S): Praluent What should I tell my health care provider before I take this medicine? They need to know if you have any of these conditions: -any unusual or allergic reaction to alirocumab, other medicines, foods, dyes, or preservatives -pregnant or trying to get pregnant -breast-feeding How should I use this medicine? This medicine is for injection under the skin. You will be taught how to prepare and give this medicine. Use exactly as directed. Take your medicine at regular intervals. Do not take your medicine more often than directed. It is important that you put your used needles and syringes in a special sharps container. Do not put them in a trash can. If you do not have a sharps container, call your pharmacist or healthcare provider to get one. Talk to your pediatrician regarding the use of this medicine in children. Special care may be needed. Overdosage: If you think you have taken too much of this medicine contact a  poison control center or emergency room at once. NOTE: This medicine is only for you. Do not share this medicine with others. What if I miss a dose? If you are on an every 2 week schedule and miss a dose, take it as soon as you can. If your next dose is to be taken in less than 7 days, then do not take the missed dose. Take the next dose at your regular time. Do not take double or extra doses. If you are on a monthly schedule and miss a dose, take it as soon as you can. If the dose given is within 7 days of the missed dose, continue with your regular monthly schedule. If the missed dose is administered after 7 days of the original date, administer the dose and start a new monthly schedule based on this date. What may interact with this medicine? Interactions are not expected. This list may not describe all possible interactions. Give your health care provider a list of all the medicines, herbs, non-prescription drugs, or dietary supplements you use. Also tell them if you smoke, drink alcohol, or use illegal drugs. Some items may interact with your medicine. What should I watch for while using this medicine? You may need blood work done while you are taking this medicine. What side effects may I notice from receiving this medicine? Side effects that you should report to your doctor or health care professional as soon as possible: -allergic reactions like skin rash, itching or hives, swelling of the face, lips, or tongue -signs and symptoms of infection  like fever or chills; cough; sore throat; pain or trouble passing urine -signs and symptoms of liver injury like dark yellow or brown urine; general ill feeling or flu-like symptoms; light-colored stools; loss of appetite; nausea; right upper belly pain; unusually weak or tired; yellowing of the eyes or skin Side effects that usually do not require medical attention (report to your doctor or health care professional if they continue or are  bothersome): -diarrhea -muscle cramps -muscle pain -pain, redness, or irritation at site where injected This list may not describe all possible side effects. Call your doctor for medical advice about side effects. You may report side effects to FDA at 1-800-FDA-1088. Where should I keep my medicine? Keep out of the reach of children. You will be instructed on how to store this medicine. Throw away any unused medicine after the expiration date on the label. NOTE: This sheet is a summary. It may not cover all possible information. If you have questions about this medicine, talk to your doctor, pharmacist, or health care provider.  2018 Elsevier/Gold Standard (2015-06-29 15:09:06)

## 2017-11-13 ENCOUNTER — Telehealth: Payer: Self-pay | Admitting: Nurse Practitioner

## 2017-11-13 ENCOUNTER — Telehealth: Payer: Self-pay | Admitting: *Deleted

## 2017-11-13 DIAGNOSIS — D631 Anemia in chronic kidney disease: Secondary | ICD-10-CM | POA: Diagnosis not present

## 2017-11-13 DIAGNOSIS — E1122 Type 2 diabetes mellitus with diabetic chronic kidney disease: Secondary | ICD-10-CM | POA: Diagnosis not present

## 2017-11-13 DIAGNOSIS — N2581 Secondary hyperparathyroidism of renal origin: Secondary | ICD-10-CM | POA: Diagnosis not present

## 2017-11-13 DIAGNOSIS — N186 End stage renal disease: Secondary | ICD-10-CM | POA: Diagnosis not present

## 2017-11-13 DIAGNOSIS — E785 Hyperlipidemia, unspecified: Secondary | ICD-10-CM

## 2017-11-13 DIAGNOSIS — D509 Iron deficiency anemia, unspecified: Secondary | ICD-10-CM | POA: Diagnosis not present

## 2017-11-13 NOTE — Telephone Encounter (Signed)
Pt requiring PA for Praluent 75 mg. PA has been started through Covermymeds 11/13/17. PA case [id number] If [employer] has not replied to your request within 24 hours please contact [employer] at [phone]

## 2017-11-13 NOTE — Telephone Encounter (Signed)
Pharmacy calling to get a update on Prior Auth for Praulent  Reference number  [id number]   Please call back with this information

## 2017-11-13 NOTE — Telephone Encounter (Signed)
Prior auth has been submitted through cover my meds.

## 2017-11-14 ENCOUNTER — Encounter (INDEPENDENT_AMBULATORY_CARE_PROVIDER_SITE_OTHER): Payer: Self-pay | Admitting: Vascular Surgery

## 2017-11-14 ENCOUNTER — Encounter

## 2017-11-14 ENCOUNTER — Encounter (INDEPENDENT_AMBULATORY_CARE_PROVIDER_SITE_OTHER): Payer: Self-pay

## 2017-11-14 ENCOUNTER — Other Ambulatory Visit (INDEPENDENT_AMBULATORY_CARE_PROVIDER_SITE_OTHER): Payer: Self-pay | Admitting: Vascular Surgery

## 2017-11-14 ENCOUNTER — Ambulatory Visit (INDEPENDENT_AMBULATORY_CARE_PROVIDER_SITE_OTHER): Payer: Medicare Other | Admitting: Vascular Surgery

## 2017-11-14 ENCOUNTER — Ambulatory Visit (INDEPENDENT_AMBULATORY_CARE_PROVIDER_SITE_OTHER): Payer: Medicare Other

## 2017-11-14 VITALS — BP 112/63 | HR 63 | Resp 15 | Ht 64.0 in | Wt 206.8 lb

## 2017-11-14 DIAGNOSIS — Z992 Dependence on renal dialysis: Secondary | ICD-10-CM | POA: Diagnosis not present

## 2017-11-14 DIAGNOSIS — T829XXS Unspecified complication of cardiac and vascular prosthetic device, implant and graft, sequela: Secondary | ICD-10-CM | POA: Diagnosis not present

## 2017-11-14 DIAGNOSIS — N186 End stage renal disease: Secondary | ICD-10-CM | POA: Diagnosis not present

## 2017-11-14 DIAGNOSIS — I25709 Atherosclerosis of coronary artery bypass graft(s), unspecified, with unspecified angina pectoris: Secondary | ICD-10-CM

## 2017-11-14 DIAGNOSIS — I10 Essential (primary) hypertension: Secondary | ICD-10-CM | POA: Diagnosis not present

## 2017-11-14 DIAGNOSIS — I2510 Atherosclerotic heart disease of native coronary artery without angina pectoris: Secondary | ICD-10-CM

## 2017-11-14 NOTE — Telephone Encounter (Signed)
Prior Auth approved from 03/03/17 to 03/04/18.  Key: [id number].  Called pharmacy. They ran the approval and it was cleared. They will notify patient when ready.

## 2017-11-15 DIAGNOSIS — D509 Iron deficiency anemia, unspecified: Secondary | ICD-10-CM | POA: Diagnosis not present

## 2017-11-15 DIAGNOSIS — N2581 Secondary hyperparathyroidism of renal origin: Secondary | ICD-10-CM | POA: Diagnosis not present

## 2017-11-15 DIAGNOSIS — E1122 Type 2 diabetes mellitus with diabetic chronic kidney disease: Secondary | ICD-10-CM | POA: Diagnosis not present

## 2017-11-15 DIAGNOSIS — N186 End stage renal disease: Secondary | ICD-10-CM | POA: Diagnosis not present

## 2017-11-15 DIAGNOSIS — D631 Anemia in chronic kidney disease: Secondary | ICD-10-CM | POA: Diagnosis not present

## 2017-11-15 NOTE — Telephone Encounter (Signed)
Fax copy of PA approval for praluent was received. Paper copy placed in nurse file folder for PA approvals.

## 2017-11-16 ENCOUNTER — Encounter (INDEPENDENT_AMBULATORY_CARE_PROVIDER_SITE_OTHER): Payer: Self-pay | Admitting: Vascular Surgery

## 2017-11-16 NOTE — Progress Notes (Signed)
MRN : [id number]  Jamie Arias is a 62 y.o. ([birth year]) female who presents with chief complaint of  Chief Complaint  Patient presents with  . Follow-up    53monthHDA follow up  .  History of Present Illness:   The patient returns to the office for follow up regarding problem with the dialysis access. Currently the patient is maintained via a right brachial axillary graft.  The patient has had multiple failed upper extremity accesses.  The patient notes a significant increase in bleeding time after decannulation.  The patient has also been informed that there is increased recirculation.    The patient denies hand pain or other symptoms consistent with steal phenomena.  No significant arm swelling.  The patient denies redness or swelling at the access site. The patient denies fever or chills at home or while on dialysis.  The patient denies amaurosis fugax or recent TIA symptoms. There are no recent neurological changes noted. The patient denies claudication symptoms or rest pain symptoms. The patient denies history of DVT, PE or superficial thrombophlebitis. The patient denies recent episodes of angina or shortness of breath.   Duplex ultrasound of the AV access shows a patent access.  The previously noted stenosis is worse compared to last study.    Current Meds  Medication Sig  . ACCU-CHEK FASTCLIX LANCETS MISC Check blood glucose twice daily, diagnosis code E11.9  . acetaminophen (TYLENOL) 325 MG tablet Take 650 mg by mouth daily as needed for moderate pain or headache.   . Alirocumab (PRALUENT) 75 MG/ML SOPN Inject 75 mg into the skin every 14 (fourteen) days.  .Marland Kitchenaspirin EC 81 MG tablet Take 81 mg by mouth daily.  .Marland Kitchenatorvastatin (LIPITOR) 80 MG tablet Take 1 tablet (80 mg total) by mouth daily.  .Marland Kitchenb complex-vitamin c-folic acid (NEPHRO-VITE) 0.8 MG TABS tablet Take 1 tablet by mouth at bedtime.  . blood glucose meter kit and supplies KIT Dispense based on patient and  insurance preference. Use up to twice daily. (FOR ICD-10 E11.9)  . Blood Glucose Monitoring Suppl (ACCU-CHEK AVIVA PLUS) w/Device KIT Use as directed to test blood sugar up to three times daily  . calcium acetate (PHOSLO) 667 MG capsule Take 667 mg by mouth 3 (three) times daily with meals.  . Calcium Acetate 668 (169 Ca) MG TABS TAKE 3 TABLETS BY MOUTH THREE TIMES DAILY WITH MEALS  . ergocalciferol (VITAMIN D2) 50000 units capsule Take 50,000 Units by mouth once a week.   . ferrous sulfate 325 (65 FE) MG tablet Take 325 mg by mouth daily with breakfast.  . fluticasone (FLONASE) 50 MCG/ACT nasal spray Place 2 sprays into both nostrils daily.  . furosemide (LASIX) 20 MG tablet Take 20 mg by mouth every other day. Non-dialysis days  . gabapentin (NEURONTIN) 300 MG capsule TAKE 1 CAPSULE(300 MG) BY MOUTH THREE TIMES DAILY  . glucose blood (ACCU-CHEK AVIVA) test strip Use as instructed to test blood sugar up to 3 times daily  . Insulin Glargine (BASAGLAR KWIKPEN) 100 UNIT/ML SOPN Inject 0.25-0.35 mLs (25-35 Units total) into the skin daily. (Patient taking differently: Inject 28 Units into the skin daily. )  . Insulin Pen Needle (GLOBAL EASE INJECT PEN NEEDLES) 31G X 5 MM MISC Inject 0.1 mLs (10 Units total) into the skin at bedtime  . Lancets (ACCU-CHEK SOFT TOUCH) lancets Use as instructed  . Lancets Misc. (ACCU-CHEK SOFTCLIX LANCET DEV) KIT Use as directed to test blood sugar up to three  times daily  . levothyroxine (SYNTHROID, LEVOTHROID) 75 MCG tablet TAKE 1 TABLET(75 MCG) BY MOUTH DAILY  . lidocaine-prilocaine (EMLA) cream Apply 1 application topically as needed (port access).  . midodrine (PROAMATINE) 5 MG tablet Take 1 tablet (5 mg total) by mouth 2 (two) times daily with a meal.  . nitroGLYCERIN (NITROSTAT) 0.4 MG SL tablet DISSOLVE ONE TABLET UNDER THE TONGUE EVERY 5 MINUTES AS NEEDED FOR CHEST PAIN.  DO NOT EXCEED A TOTAL OF 3 DOSES IN 15 MINUTES  . ondansetron (ZOFRAN-ODT) 4 MG  disintegrating tablet Take 4 mg by mouth as needed.  . traZODone (DESYREL) 50 MG tablet TAKE 1 TABLET BY MOUTH EVERY NIGHT AT BEDTIME    Past Medical History:  Diagnosis Date  . Anemia   . Chronic diastolic CHF (congestive heart failure) ([place])    a. Due to ischemic cardiomyopathy. EF as low as 35%, improved to normal s/p CABG; b. echo 07/06/13: EF 55-60%, no RWMAs, mod TR, trivial pericardial effusion not c/w tamponade physiology;  c. 10/2015 Echo: EF 65%, Gr1 DD, triv AI, mild MR, mildly dil LA, mod TR, PASP 30mHg.  .Marland KitchenCoronary artery disease    a. NSTEMI 06/2013; b.cath: severe three-vessel CAD w/ EF 30% & mild-mod MR; c. s/p 3 vessel CABG 07/02/13 (LIMA-LAD, SVG-OM, and SVG-RPDA);  d. 10/2015 MV: no ischemia/infarct.  . Diabetes mellitus without complication ([place]   . Diabetic neuropathy ([place]   . Dialysis patient ([facility]    MWF  . ESRD ([place] stage renal disease) ([place]    a. 12/2015 initiated - mwf dialysis.  .Marland KitchenGERD (gastroesophageal reflux disease)   . Hyperlipidemia   . Hypertension   . Hypothyroidism   . Myocardial infarction ([place] 2015  . Neuropathy   . Pleural effusion 2015  . Pulmonary hypertension ([place]   . Wears dentures    full lower    Past Surgical History:  Procedure Laterality Date  . A/V FISTULAGRAM Right 12/17/2016   Procedure: A/V Fistulagram;  Surgeon: DAlgernon Huxley MD;  [place];  Service: Cardiovascular;  Laterality: Right;  . A/V FISTULAGRAM Right 01/07/2017   Procedure: A/V Fistulagram;  Surgeon: DAlgernon Huxley MD;  [place];  Service: Cardiovascular;  Laterality: Right;  . A/V SHUNT INTERVENTION N/A 02/22/2017   Procedure: A/V SHUNT INTERVENTION;  Surgeon: DAlgernon Huxley MD;  [place] [facility];  Service: Cardiovascular;  Laterality: N/A;  . AV FISTULA PLACEMENT Right 02/03/2016   Procedure: INSERTION OF ARTERIOVENOUS (AV) GORE-TEX GRAFT ARM ( BRACH / AXILLARY );  Surgeon: GKatha Cabal MD;  Location: [facility]  ORS;  Service: Vascular;  Laterality: Right;  . CARDIAC CATHETERIZATION    . CATARACT EXTRACTION Bilateral   . CHOLECYSTECTOMY N/A 12/09/2014   Procedure: LAPAROSCOPIC CHOLECYSTECTOMY;  Surgeon: CMarlyce [name] MD;  Location: [facility] ORS;  Service: General;  Laterality: N/A;  . CORONARY ARTERY BYPASS GRAFT N/A 07/02/2013   Procedure: CORONARY ARTERY BYPASS GRAFTING (CABG);  Surgeon: PIvin [name] MD;  [place]  Service: Open Heart Surgery;  Laterality: N/A;  CABG x three, using left internal mammary artery and right leg greater saphenous vein harvested endoscopically  . ESOPHAGOGASTRODUODENOSCOPY (EGD) WITH PROPOFOL N/A 11/24/2015   Procedure: ESOPHAGOGASTRODUODENOSCOPY (EGD) WITH PROPOFOL;  Surgeon: DLucilla [name] MD;  [place]  Service: Endoscopy;  Laterality: N/A;  Diabetic - insulin  . EYE SURGERY Bilateral    Cataract Extraction with IOL  . INTRAOPERATIVE TRANSESOPHAGEAL ECHOCARDIOGRAM N/A 07/02/2013  Procedure: INTRAOPERATIVE TRANSESOPHAGEAL ECHOCARDIOGRAM;  Surgeon: Ivin [name], MD;  Location: [place];  Service: Open Heart Surgery;  Laterality: N/A;  . PERIPHERAL VASCULAR CATHETERIZATION Right 12/06/2015   Procedure: Dialysis/Perma Catheter Insertion;  Surgeon: Katha Cabal, MD;  Location: [place] [facility] LAB;  Service: Cardiovascular;  Laterality: Right;  . PERIPHERAL VASCULAR THROMBECTOMY Right 09/28/2016   Procedure: Peripheral Vascular Thrombectomy;  Surgeon: Katha Cabal, MD;  Location: [place] [facility] LAB;  Service: Cardiovascular;  Laterality: Right;  . PORTA CATH REMOVAL N/A 06/01/2016   Procedure: Glori [name] Cath Removal;  Surgeon: Katha Cabal, MD;  Location: [place] CV LAB;  Service: Cardiovascular;  Laterality: N/A;  . THORACENTESIS Left 2015    Social History Social History   Tobacco Use  . Smoking status: Never Smoker  . Smokeless tobacco: Never Used  Substance Use Topics  . Alcohol use: No    Alcohol/week: 0.0  standard drinks  . Drug use: No    Family History Family History  Problem Relation Age of Onset  . COPD Mother   . Cancer Mother        Lung  . Pulmonary embolism Father   . Diabetes Father   . Diabetes Paternal Grandfather   . Heart disease Maternal Grandmother   . Colon cancer Neg Hx   . Colon polyps Neg Hx   . Esophageal cancer Neg Hx   . Pancreatic cancer Neg Hx   . Liver disease Neg Hx     Allergies  Allergen Reactions  . Nsaids Other (See Comments)    Contraindicated due to kidney disease.  Marland Kitchen Doxycycline Other (See Comments)    tremor     REVIEW OF SYSTEMS (Negative unless checked)  Constitutional: []Weight loss  []Fever  []Chills Cardiac: [x]Chest pain   []Chest pressure   []Palpitations   []Shortness of breath when laying flat   []Shortness of breath with exertion. Vascular:  []Pain in legs with walking   []Pain in legs at rest  []History of DVT   []Phlebitis   []Swelling in legs   []Varicose veins   []Non-healing ulcers Pulmonary:   []Uses home oxygen   []Productive cough   []Hemoptysis   []Wheeze  []COPD   []Asthma Neurologic:  []Dizziness   []Seizures   []History of stroke   []History of TIA  []Aphasia   []Vissual changes   []Weakness or numbness in arm   []Weakness or numbness in leg Musculoskeletal:   []Joint swelling   []Joint pain   []Low back pain Hematologic:  []Easy bruising  []Easy bleeding   []Hypercoagulable state   []Anemic Gastrointestinal:  []Diarrhea   []Vomiting  []Gastroesophageal reflux/heartburn   []Difficulty swallowing. Genitourinary:  [x]Chronic kidney disease   []Difficult urination  []Frequent urination   []Blood in urine Skin:  []Rashes   []Ulcers  Psychological:  []History of anxiety   [] History of major depression.  Physical Examination  Vitals:   11/14/17 1528  BP: 112/63  Pulse: 63  Resp: 15  Weight: 206 lb 13.6 oz (93.8 kg)  Height: 5' 4" (1.626 m)   Body mass index is 35.51 kg/m. Gen: WD/WN, NAD Head: [place]/AT, No  temporalis wasting.  Ear/Nose/Throat: Hearing grossly intact, nares w/o erythema or drainage Eyes: PER, EOMI, sclera nonicteric.  Neck: Supple, no large masses.   Pulmonary:  Good air movement, no audible wheezing bilaterally, no use of accessory muscles.  Cardiac: RRR, no JVD Vascular: right arm AV graft with poor thrill and poor bruit Vessel Right  Left  Radial Palpable Palpable  Ulnar Palpable Palpable  Brachial Palpable Palpable  Gastrointestinal: Non-distended. No guarding/no peritoneal signs.  Musculoskeletal: M/S 5/5 throughout.  No deformity or atrophy.  Neurologic: CN 2-12 intact. Symmetrical.  Speech is fluent. Motor exam as listed above. Psychiatric: Judgment intact, Mood & affect appropriate for pt's clinical situation. Dermatologic: No rashes or ulcers noted.  No changes consistent with cellulitis. Lymph : No lichenification or skin changes of chronic lymphedema.  CBC Lab Results  Component Value Date   WBC 6.2 09/05/2017   HGB 12.1 09/05/2017   HCT 35.3 09/05/2017   MCV 95.5 09/05/2017   PLT 212 09/05/2017    BMET    Component Value Date/Time   NA 142 09/05/2017 0436   NA 140 03/30/2014 1611   K 4.2 09/05/2017 0436   K 4.3 03/30/2014 1611   CL 101 09/05/2017 0436   CL 105 03/30/2014 1611   CO2 27 09/05/2017 0436   CO2 29 03/30/2014 1611   GLUCOSE 84 09/05/2017 0436   GLUCOSE 187 (H) 03/30/2014 1611   BUN 47 (H) 09/05/2017 0436   BUN 21 (H) 03/30/2014 1611   CREATININE 10.14 (H) 09/05/2017 0436   CREATININE 3.14 (H) 09/09/2015 1534   CALCIUM 8.4 (L) 09/05/2017 0436   CALCIUM 9.0 03/30/2014 1611   GFRNONAA 4 (L) 09/05/2017 0436   GFRNONAA 36 (L) 03/30/2014 1611   GFRNONAA >60 07/23/2013 0741   GFRAA 4 (L) 09/05/2017 0436   GFRAA 44 (L) 03/30/2014 1611   GFRAA >60 07/23/2013 0741   CrCl cannot be calculated (Patient's most recent lab result is older than the maximum 21 days allowed.).  COAG Lab Results  Component Value Date   INR 0.99 01/30/2016    INR 1.19 07/13/2013   INR 1.27 07/12/2013    Radiology No results found.    Assessment/Plan 1. Complication from renal dialysis device, sequela Recommend:  The patient is experiencing increasing problems with their dialysis access.  Patient should have a fistulagram with the intention for intervention.  The intention for intervention is to restore appropriate flow and prevent thrombosis and possible loss of the access.  As well as improve the quality of dialysis therapy.  The risks, benefits and alternative therapies were reviewed in detail with the patient.  All questions were answered.  The patient agrees to proceed with angio/intervention.    2. ESRD on dialysis [facility]) Continue dialysis without interuption  3. Essential hypertension Continue antihypertensive medications as already ordered, these medications have been reviewed and there are no changes at this time.   4. Coronary artery disease involving coronary bypass graft of native heart with angina pectoris ([place]) Continue cardiac and antihypertensive medications as already ordered and reviewed, no changes at this time.  Continue statin as ordered and reviewed, no changes at this time  Nitrates PRN for chest pain     Hortencia Pilar, MD  11/16/2017 1:28 PM

## 2017-11-18 DIAGNOSIS — D509 Iron deficiency anemia, unspecified: Secondary | ICD-10-CM | POA: Diagnosis not present

## 2017-11-18 DIAGNOSIS — D631 Anemia in chronic kidney disease: Secondary | ICD-10-CM | POA: Diagnosis not present

## 2017-11-18 DIAGNOSIS — N2581 Secondary hyperparathyroidism of renal origin: Secondary | ICD-10-CM | POA: Diagnosis not present

## 2017-11-18 DIAGNOSIS — N186 End stage renal disease: Secondary | ICD-10-CM | POA: Diagnosis not present

## 2017-11-18 DIAGNOSIS — E1122 Type 2 diabetes mellitus with diabetic chronic kidney disease: Secondary | ICD-10-CM | POA: Diagnosis not present

## 2017-11-18 NOTE — Telephone Encounter (Signed)
See below patient returning call re: Praluent

## 2017-11-18 NOTE — Telephone Encounter (Signed)
Left a message for the patient to call back.  

## 2017-11-20 DIAGNOSIS — N186 End stage renal disease: Secondary | ICD-10-CM | POA: Diagnosis not present

## 2017-11-20 DIAGNOSIS — E1122 Type 2 diabetes mellitus with diabetic chronic kidney disease: Secondary | ICD-10-CM | POA: Diagnosis not present

## 2017-11-20 DIAGNOSIS — D631 Anemia in chronic kidney disease: Secondary | ICD-10-CM | POA: Diagnosis not present

## 2017-11-20 DIAGNOSIS — D509 Iron deficiency anemia, unspecified: Secondary | ICD-10-CM | POA: Diagnosis not present

## 2017-11-20 DIAGNOSIS — N2581 Secondary hyperparathyroidism of renal origin: Secondary | ICD-10-CM | POA: Diagnosis not present

## 2017-11-22 DIAGNOSIS — D509 Iron deficiency anemia, unspecified: Secondary | ICD-10-CM | POA: Diagnosis not present

## 2017-11-22 DIAGNOSIS — D631 Anemia in chronic kidney disease: Secondary | ICD-10-CM | POA: Diagnosis not present

## 2017-11-22 DIAGNOSIS — N2581 Secondary hyperparathyroidism of renal origin: Secondary | ICD-10-CM | POA: Diagnosis not present

## 2017-11-22 DIAGNOSIS — N186 End stage renal disease: Secondary | ICD-10-CM | POA: Diagnosis not present

## 2017-11-22 DIAGNOSIS — E1122 Type 2 diabetes mellitus with diabetic chronic kidney disease: Secondary | ICD-10-CM | POA: Diagnosis not present

## 2017-11-25 DIAGNOSIS — N186 End stage renal disease: Secondary | ICD-10-CM | POA: Diagnosis not present

## 2017-11-25 DIAGNOSIS — E1122 Type 2 diabetes mellitus with diabetic chronic kidney disease: Secondary | ICD-10-CM | POA: Diagnosis not present

## 2017-11-25 DIAGNOSIS — N2581 Secondary hyperparathyroidism of renal origin: Secondary | ICD-10-CM | POA: Diagnosis not present

## 2017-11-25 DIAGNOSIS — D509 Iron deficiency anemia, unspecified: Secondary | ICD-10-CM | POA: Diagnosis not present

## 2017-11-25 DIAGNOSIS — D631 Anemia in chronic kidney disease: Secondary | ICD-10-CM | POA: Diagnosis not present

## 2017-11-25 NOTE — Telephone Encounter (Signed)
No answer. Left message to call back.   

## 2017-11-26 NOTE — Telephone Encounter (Signed)
Patient called back. She has not heard form her pharmacy yet. Advised patient that Praluent was approved and she could call her pharmacy to make sure it is ready. She will do so and let us know if she has any issue with that. She also asked about stopping the zetia in the setting of taking the Praluent.  Per Ignacia Bayley, NP - "In this setting, we will look into getting praluent 75 q 2 wks for her.  If she is able to obtain this, we would stop Zetia."  Patient advised to make sure she can afford the Praluent and then can stop zetia once started. Routing to [place] to again confirm this.

## 2017-11-26 NOTE — Telephone Encounter (Signed)
Patient returning call re praluent.

## 2017-11-27 DIAGNOSIS — D631 Anemia in chronic kidney disease: Secondary | ICD-10-CM | POA: Diagnosis not present

## 2017-11-27 DIAGNOSIS — E1122 Type 2 diabetes mellitus with diabetic chronic kidney disease: Secondary | ICD-10-CM | POA: Diagnosis not present

## 2017-11-27 DIAGNOSIS — D509 Iron deficiency anemia, unspecified: Secondary | ICD-10-CM | POA: Diagnosis not present

## 2017-11-27 DIAGNOSIS — N186 End stage renal disease: Secondary | ICD-10-CM | POA: Diagnosis not present

## 2017-11-27 DIAGNOSIS — N2581 Secondary hyperparathyroidism of renal origin: Secondary | ICD-10-CM | POA: Diagnosis not present

## 2017-11-28 NOTE — Telephone Encounter (Signed)
Patient stated that she will pick up the Praluent tomorrow and will call back to state whether or not she will start this medication. If she does start it, she will need repeat fasting labs in two months.

## 2017-11-29 ENCOUNTER — Other Ambulatory Visit (INDEPENDENT_AMBULATORY_CARE_PROVIDER_SITE_OTHER): Payer: Self-pay | Admitting: Nurse Practitioner

## 2017-11-29 DIAGNOSIS — N186 End stage renal disease: Secondary | ICD-10-CM | POA: Diagnosis not present

## 2017-11-29 DIAGNOSIS — N2581 Secondary hyperparathyroidism of renal origin: Secondary | ICD-10-CM | POA: Diagnosis not present

## 2017-11-29 DIAGNOSIS — E1122 Type 2 diabetes mellitus with diabetic chronic kidney disease: Secondary | ICD-10-CM | POA: Diagnosis not present

## 2017-11-29 DIAGNOSIS — D509 Iron deficiency anemia, unspecified: Secondary | ICD-10-CM | POA: Diagnosis not present

## 2017-11-29 DIAGNOSIS — D631 Anemia in chronic kidney disease: Secondary | ICD-10-CM | POA: Diagnosis not present

## 2017-12-02 DIAGNOSIS — N186 End stage renal disease: Secondary | ICD-10-CM | POA: Diagnosis not present

## 2017-12-02 DIAGNOSIS — N2581 Secondary hyperparathyroidism of renal origin: Secondary | ICD-10-CM | POA: Diagnosis not present

## 2017-12-02 DIAGNOSIS — D509 Iron deficiency anemia, unspecified: Secondary | ICD-10-CM | POA: Diagnosis not present

## 2017-12-02 DIAGNOSIS — Z992 Dependence on renal dialysis: Secondary | ICD-10-CM | POA: Diagnosis not present

## 2017-12-02 DIAGNOSIS — D631 Anemia in chronic kidney disease: Secondary | ICD-10-CM | POA: Diagnosis not present

## 2017-12-02 DIAGNOSIS — E1122 Type 2 diabetes mellitus with diabetic chronic kidney disease: Secondary | ICD-10-CM | POA: Diagnosis not present

## 2017-12-03 ENCOUNTER — Ambulatory Visit
Admission: RE | Admit: 2017-12-03 | Discharge: 2017-12-03 | Disposition: A | Discharge status: Home / Self Care | Payer: Medicare Other | Source: Ambulatory Visit | Attending: Vascular Surgery | Admitting: Vascular Surgery

## 2017-12-03 ENCOUNTER — Encounter
Admission: RE | Disposition: A | Discharge status: Home / Self Care | Payer: Self-pay | Source: Ambulatory Visit | Attending: Vascular Surgery

## 2017-12-03 ENCOUNTER — Encounter: Payer: Self-pay | Admitting: *Deleted

## 2017-12-03 DIAGNOSIS — Z9049 Acquired absence of other specified parts of digestive tract: Secondary | ICD-10-CM | POA: Insufficient documentation

## 2017-12-03 DIAGNOSIS — Z955 Presence of coronary angioplasty implant and graft: Secondary | ICD-10-CM | POA: Insufficient documentation

## 2017-12-03 DIAGNOSIS — Z951 Presence of aortocoronary bypass graft: Secondary | ICD-10-CM | POA: Diagnosis not present

## 2017-12-03 DIAGNOSIS — K219 Gastro-esophageal reflux disease without esophagitis: Secondary | ICD-10-CM | POA: Insufficient documentation

## 2017-12-03 DIAGNOSIS — Z8249 Family history of ischemic heart disease and other diseases of the circulatory system: Secondary | ICD-10-CM | POA: Insufficient documentation

## 2017-12-03 DIAGNOSIS — Z9841 Cataract extraction status, right eye: Secondary | ICD-10-CM | POA: Diagnosis not present

## 2017-12-03 DIAGNOSIS — Z9889 Other specified postprocedural states: Secondary | ICD-10-CM | POA: Diagnosis not present

## 2017-12-03 DIAGNOSIS — I132 Hypertensive heart and chronic kidney disease with heart failure and with stage 5 chronic kidney disease, or end stage renal disease: Secondary | ICD-10-CM | POA: Insufficient documentation

## 2017-12-03 DIAGNOSIS — E1122 Type 2 diabetes mellitus with diabetic chronic kidney disease: Secondary | ICD-10-CM | POA: Diagnosis not present

## 2017-12-03 DIAGNOSIS — Z881 Allergy status to other antibiotic agents status: Secondary | ICD-10-CM | POA: Diagnosis not present

## 2017-12-03 DIAGNOSIS — Y832 Surgical operation with anastomosis, bypass or graft as the cause of abnormal reaction of the patient, or of later complication, without mention of misadventure at the time of the procedure: Secondary | ICD-10-CM | POA: Insufficient documentation

## 2017-12-03 DIAGNOSIS — N186 End stage renal disease: Secondary | ICD-10-CM | POA: Diagnosis not present

## 2017-12-03 DIAGNOSIS — Z992 Dependence on renal dialysis: Secondary | ICD-10-CM | POA: Insufficient documentation

## 2017-12-03 DIAGNOSIS — E039 Hypothyroidism, unspecified: Secondary | ICD-10-CM | POA: Diagnosis not present

## 2017-12-03 DIAGNOSIS — E785 Hyperlipidemia, unspecified: Secondary | ICD-10-CM | POA: Insufficient documentation

## 2017-12-03 DIAGNOSIS — I272 Pulmonary hypertension, unspecified: Secondary | ICD-10-CM | POA: Insufficient documentation

## 2017-12-03 DIAGNOSIS — Z836 Family history of other diseases of the respiratory system: Secondary | ICD-10-CM | POA: Insufficient documentation

## 2017-12-03 DIAGNOSIS — I5032 Chronic diastolic (congestive) heart failure: Secondary | ICD-10-CM | POA: Insufficient documentation

## 2017-12-03 DIAGNOSIS — I252 Old myocardial infarction: Secondary | ICD-10-CM | POA: Diagnosis not present

## 2017-12-03 DIAGNOSIS — E1140 Type 2 diabetes mellitus with diabetic neuropathy, unspecified: Secondary | ICD-10-CM | POA: Diagnosis not present

## 2017-12-03 DIAGNOSIS — Z9842 Cataract extraction status, left eye: Secondary | ICD-10-CM | POA: Insufficient documentation

## 2017-12-03 DIAGNOSIS — T8241XA Breakdown (mechanical) of vascular dialysis catheter, initial encounter: Secondary | ICD-10-CM | POA: Diagnosis not present

## 2017-12-03 DIAGNOSIS — I25709 Atherosclerosis of coronary artery bypass graft(s), unspecified, with unspecified angina pectoris: Secondary | ICD-10-CM | POA: Diagnosis not present

## 2017-12-03 DIAGNOSIS — Z833 Family history of diabetes mellitus: Secondary | ICD-10-CM | POA: Diagnosis not present

## 2017-12-03 DIAGNOSIS — T82858A Stenosis of vascular prosthetic devices, implants and grafts, initial encounter: Secondary | ICD-10-CM | POA: Insufficient documentation

## 2017-12-03 DIAGNOSIS — Z886 Allergy status to analgesic agent status: Secondary | ICD-10-CM | POA: Insufficient documentation

## 2017-12-03 HISTORY — PX: A/V FISTULAGRAM: CATH118298

## 2017-12-03 LAB — GLUCOSE, CAPILLARY: Glucose-Capillary: 109 mg/dL — ABNORMAL HIGH (ref 70–99)

## 2017-12-03 LAB — POTASSIUM (ARMC VASCULAR LAB ONLY): Potassium (ARMC vascular lab): 4.8 (ref 3.5–5.1)

## 2017-12-03 SURGERY — A/V FISTULAGRAM
Anesthesia: Moderate Sedation | Laterality: Right

## 2017-12-03 MED ORDER — MIDAZOLAM HCL 5 MG/5ML IJ SOLN
INTRAMUSCULAR | Status: AC
  Filled 2017-12-03: qty 5

## 2017-12-03 MED ORDER — HYDROMORPHONE HCL 1 MG/ML IJ SOLN: 1.0000 mg | Freq: Once | INTRAMUSCULAR | Status: DC | PRN

## 2017-12-03 MED ORDER — HEPARIN SODIUM (PORCINE) 1000 UNIT/ML IJ SOLN
INTRAMUSCULAR | Status: DC | PRN
  Administered 2017-12-03: 3000 [IU] via INTRAVENOUS

## 2017-12-03 MED ORDER — CEFAZOLIN SODIUM-DEXTROSE 1-4 GM/50ML-% IV SOLN
1.0000 g | Freq: Once | INTRAVENOUS | Status: AC
  Administered 2017-12-03: 1 g via INTRAVENOUS

## 2017-12-03 MED ORDER — HEPARIN (PORCINE) IN NACL 1000-0.9 UT/500ML-% IV SOLN
INTRAVENOUS | Status: AC
  Filled 2017-12-03: qty 1000

## 2017-12-03 MED ORDER — FENTANYL CITRATE (PF) 100 MCG/2ML IJ SOLN
INTRAMUSCULAR | Status: DC | PRN
  Administered 2017-12-03: 50 ug via INTRAVENOUS

## 2017-12-03 MED ORDER — SODIUM CHLORIDE FLUSH 0.9 % IV SOLN
INTRAVENOUS | Status: AC
  Filled 2017-12-03: qty 20

## 2017-12-03 MED ORDER — IOPAMIDOL (ISOVUE-300) INJECTION 61%
INTRAVENOUS | Status: DC | PRN
  Administered 2017-12-03: 30 mL via INTRA_ARTERIAL

## 2017-12-03 MED ORDER — LIDOCAINE HCL (PF) 1 % IJ SOLN
INTRAMUSCULAR | Status: AC
  Filled 2017-12-03: qty 30

## 2017-12-03 MED ORDER — SODIUM CHLORIDE 0.9 % IV SOLN
INTRAVENOUS | Status: DC
  Administered 2017-12-03: 10:00:00 via INTRAVENOUS

## 2017-12-03 MED ORDER — HEPARIN SODIUM (PORCINE) 1000 UNIT/ML IJ SOLN
INTRAMUSCULAR | Status: AC
  Filled 2017-12-03: qty 1

## 2017-12-03 MED ORDER — ONDANSETRON HCL 4 MG/2ML IJ SOLN: 4.0000 mg | Freq: Four times a day (QID) | INTRAMUSCULAR | Status: DC | PRN

## 2017-12-03 MED ORDER — FENTANYL CITRATE (PF) 100 MCG/2ML IJ SOLN
INTRAMUSCULAR | Status: AC
  Filled 2017-12-03: qty 2

## 2017-12-03 MED ORDER — MIDAZOLAM HCL 2 MG/2ML IJ SOLN
INTRAMUSCULAR | Status: DC | PRN
  Administered 2017-12-03: 2 mg via INTRAVENOUS

## 2017-12-03 SURGICAL SUPPLY — 16 items
BALLN DORADO 6X40X80 (BALLOONS) ×3
BALLN DORADO 8X40X80 (BALLOONS) ×3
BALLOON DORADO 6X40X80 (BALLOONS) ×1 IMPLANT
BALLOON DORADO 8X40X80 (BALLOONS) ×1 IMPLANT
CATH BEACON 5 .035 40 KMP TP (CATHETERS) ×1 IMPLANT
CATH BEACON 5 .038 40 KMP TP (CATHETERS) ×2
DEVICE PRESTO INFLATION (MISCELLANEOUS) ×3 IMPLANT
DRAPE BRACHIAL (DRAPES) ×3 IMPLANT
NEEDLE ENTRY 21GA 7CM ECHOTIP (NEEDLE) ×3 IMPLANT
PACK ANGIOGRAPHY (CUSTOM PROCEDURE TRAY) ×3 IMPLANT
SET INTRO CAPELLA COAXIAL (SET/KITS/TRAYS/PACK) ×3 IMPLANT
SHEATH BRITE TIP 6FRX5.5 (SHEATH) ×3 IMPLANT
SHEATH BRITE TIP 7FRX5.5 (SHEATH) ×3 IMPLANT
STENT COVERA FLARED 8X40X80 (Permanent Stent) ×3 IMPLANT
SUT MNCRL AB 4-0 PS2 18 (SUTURE) ×3 IMPLANT
WIRE MAGIC TORQUE 260C (WIRE) ×3 IMPLANT

## 2017-12-03 NOTE — Op Note (Signed)
OPERATIVE NOTE   PROCEDURE: 1. Contrast injection right brachial axillary AV access 2. Percutaneous transluminal angioplasty and stent placement venous outflow right brachial axillary AV graft  PRE-OPERATIVE DIAGNOSIS: Complication of dialysis access                                                       End Stage Renal Disease  POST-OPERATIVE DIAGNOSIS: same as above   SURGEON: Katha Cabal, M.D.  ANESTHESIA: Conscious sedation was administered under my direct supervision by the interventional radiology RN. IV Versed plus fentanyl were utilized. Continuous ECG, pulse oximetry and blood pressure was monitored throughout the entire procedure.  Conscious sedation was for a total of 31 minutes.  ESTIMATED BLOOD LOSS: minimal  FINDING(S): 1. Diffuse intragraft greater than 80% stenoses with a greater than 90% stenosis in the previous stent at the venous outflow/venous anastomosis  SPECIMEN(S):  None  CONTRAST: 30 cc  FLUOROSCOPY TIME: 4.0 minutes  INDICATIONS: Jamie Arias is a 62 y.o. female who  presents with malfunctioning right brachial axillary AV access.  The patient is scheduled for angiography with possible intervention of the AV access.  The patient is aware the risks include but are not limited to: bleeding, infection, thrombosis of the cannulated access, and possible anaphylactic reaction to the contrast.  The patient acknowledges if the access can not be salvaged a tunneled catheter will be needed and will be placed during this procedure.  The patient is aware of the risks of the procedure and elects to proceed with the angiogram and intervention.  DESCRIPTION: After full informed written consent was obtained, the patient was brought back to the Special Procedure suite and placed supine position.  Appropriate cardiopulmonary monitors were placed.  The right arm was prepped and draped in the standard fashion.  Appropriate timeout is called. The right brachial axillary  graft was cannulated with a micropuncture needle, this was performed closer to the midportion of the graft than usual in order to avoid a previously placed stent at the arterial anastomosis.  Cannulation was performed with ultrasound guidance. Ultrasound was placed in a sterile sleeve, the AV access was interrogated and noted to be echolucent and compressible indicating patency. Image was recorded for the permanent record. The puncture is performed under continuous ultrasound visualization.   The microwire was advanced and the needle was exchanged for  a microsheath.  The J-wire was then advanced and a 6 Fr sheath inserted.  Hand injections were completed to image the access from the arterial anastomosis through the entire access.  The central venous structures were also imaged by hand injections.  Based on the images,  3000 units of heparin was given and a wire was negotiated through the strictures within the venous portion of the graft.  Initially, a 6 mm x 40 mm Dorado balloon was used to predilate the 90% stenosis of the venous outflow which appears within the previously placed flared stent.  Then an 8 mm x 40 mm flared Covera was deployed across the stenoses and postdilated with an 8 mm Dorado balloon.  Inflation was to for 20 atm proximal for 30 seconds.  In a serial fashion the 8 mm Dorado balloon was then used to dilate the entire length of the graft up to the sheath.  However, I was not able to complete the  dilation because of the access point for the antegrade sheath and therefore ultrasound was reprepped and delivered back onto the field.  The graft was evaluated at the level of the deltoid lidocaine was infiltrated into the skin ultrasound demonstrated a patent graft which was echolucent image was recorded for the permanent record and the micropuncture needle was used to access the graft under direct visualization in a retrograde direction.  Microwire followed by micro-sheath was then inserted.  J-wire  followed by a 6 French sheath.  The wire was then negotiated out into the distal brachial artery and the 8 mm Dorado balloon again advanced over the wire and passed across the last portion of the graft which still had high-grade stenosis.  As the balloon was going up the antegrade sheath was removed.  With the balloon inflation the Monocryl suture was placed for hemostasis.  Balloon inflation was to 20 atm for approximately 1 minute.  Kumpe catheter was then advanced down to the level of the brachial artery and the anastomosis.  Hand-injection contrast was used to visualize the entire graft.   Follow-up imaging demonstrates complete resolution of the stricture, less than 5% residual stenosis throughout the entire system, with rapid flow of contrast through the graft, the central venous anatomy is preserved.  A 4-0 Monocryl purse-string suture was sewn around the sheath.  The sheath was removed and light pressure was applied.  A sterile bandage was applied to the puncture site.    COMPLICATIONS: None  CONDITION: Jamie Arias, M.D [facility] Office: [phone]  12/03/2017 11:05 AM

## 2017-12-03 NOTE — H&P (Signed)
[facility] History & Physical Update  The patient was interviewed and re-examined.  The patient's previous History and Physical has been reviewed and is unchanged.  There is no change in the plan of care. We plan to proceed with the scheduled procedure.  Hortencia Pilar, MD  12/03/2017, 9:11 AM

## 2017-12-04 ENCOUNTER — Encounter: Payer: Self-pay | Admitting: Vascular Surgery

## 2017-12-04 ENCOUNTER — Other Ambulatory Visit: Payer: Self-pay | Admitting: Family Medicine

## 2017-12-04 DIAGNOSIS — N2581 Secondary hyperparathyroidism of renal origin: Secondary | ICD-10-CM | POA: Diagnosis not present

## 2017-12-04 DIAGNOSIS — D509 Iron deficiency anemia, unspecified: Secondary | ICD-10-CM | POA: Diagnosis not present

## 2017-12-04 DIAGNOSIS — Z23 Encounter for immunization: Secondary | ICD-10-CM | POA: Diagnosis not present

## 2017-12-04 DIAGNOSIS — E1122 Type 2 diabetes mellitus with diabetic chronic kidney disease: Secondary | ICD-10-CM | POA: Diagnosis not present

## 2017-12-04 DIAGNOSIS — N186 End stage renal disease: Secondary | ICD-10-CM | POA: Diagnosis not present

## 2017-12-04 DIAGNOSIS — D631 Anemia in chronic kidney disease: Secondary | ICD-10-CM | POA: Diagnosis not present

## 2017-12-04 NOTE — Telephone Encounter (Signed)
Left a message for the patient to call back.  

## 2017-12-05 NOTE — Telephone Encounter (Signed)
Patient stated that she will now pick up her Praluent tomorrow and call Monday with an update.

## 2017-12-05 NOTE — Addendum Note (Signed)
Addended by: Ricci Barker on: 12/05/2017 04:46 PM   Modules accepted: Orders

## 2017-12-06 DIAGNOSIS — Z23 Encounter for immunization: Secondary | ICD-10-CM | POA: Diagnosis not present

## 2017-12-06 DIAGNOSIS — D631 Anemia in chronic kidney disease: Secondary | ICD-10-CM | POA: Diagnosis not present

## 2017-12-06 DIAGNOSIS — D509 Iron deficiency anemia, unspecified: Secondary | ICD-10-CM | POA: Diagnosis not present

## 2017-12-06 DIAGNOSIS — N186 End stage renal disease: Secondary | ICD-10-CM | POA: Diagnosis not present

## 2017-12-06 DIAGNOSIS — N2581 Secondary hyperparathyroidism of renal origin: Secondary | ICD-10-CM | POA: Diagnosis not present

## 2017-12-06 DIAGNOSIS — E1122 Type 2 diabetes mellitus with diabetic chronic kidney disease: Secondary | ICD-10-CM | POA: Diagnosis not present

## 2017-12-09 DIAGNOSIS — N186 End stage renal disease: Secondary | ICD-10-CM | POA: Diagnosis not present

## 2017-12-09 DIAGNOSIS — Z23 Encounter for immunization: Secondary | ICD-10-CM | POA: Diagnosis not present

## 2017-12-09 DIAGNOSIS — D509 Iron deficiency anemia, unspecified: Secondary | ICD-10-CM | POA: Diagnosis not present

## 2017-12-09 DIAGNOSIS — E1122 Type 2 diabetes mellitus with diabetic chronic kidney disease: Secondary | ICD-10-CM | POA: Diagnosis not present

## 2017-12-09 DIAGNOSIS — N2581 Secondary hyperparathyroidism of renal origin: Secondary | ICD-10-CM | POA: Diagnosis not present

## 2017-12-09 DIAGNOSIS — D631 Anemia in chronic kidney disease: Secondary | ICD-10-CM | POA: Diagnosis not present

## 2017-12-11 DIAGNOSIS — E1122 Type 2 diabetes mellitus with diabetic chronic kidney disease: Secondary | ICD-10-CM | POA: Diagnosis not present

## 2017-12-11 DIAGNOSIS — Z23 Encounter for immunization: Secondary | ICD-10-CM | POA: Diagnosis not present

## 2017-12-11 DIAGNOSIS — N2581 Secondary hyperparathyroidism of renal origin: Secondary | ICD-10-CM | POA: Diagnosis not present

## 2017-12-11 DIAGNOSIS — D509 Iron deficiency anemia, unspecified: Secondary | ICD-10-CM | POA: Diagnosis not present

## 2017-12-11 DIAGNOSIS — D631 Anemia in chronic kidney disease: Secondary | ICD-10-CM | POA: Diagnosis not present

## 2017-12-11 DIAGNOSIS — N186 End stage renal disease: Secondary | ICD-10-CM | POA: Diagnosis not present

## 2017-12-12 DIAGNOSIS — D631 Anemia in chronic kidney disease: Secondary | ICD-10-CM | POA: Diagnosis not present

## 2017-12-12 DIAGNOSIS — Z23 Encounter for immunization: Secondary | ICD-10-CM | POA: Diagnosis not present

## 2017-12-12 DIAGNOSIS — E1122 Type 2 diabetes mellitus with diabetic chronic kidney disease: Secondary | ICD-10-CM | POA: Diagnosis not present

## 2017-12-12 DIAGNOSIS — N2581 Secondary hyperparathyroidism of renal origin: Secondary | ICD-10-CM | POA: Diagnosis not present

## 2017-12-12 DIAGNOSIS — D509 Iron deficiency anemia, unspecified: Secondary | ICD-10-CM | POA: Diagnosis not present

## 2017-12-12 DIAGNOSIS — N186 End stage renal disease: Secondary | ICD-10-CM | POA: Diagnosis not present

## 2017-12-16 DIAGNOSIS — D631 Anemia in chronic kidney disease: Secondary | ICD-10-CM | POA: Diagnosis not present

## 2017-12-16 DIAGNOSIS — N186 End stage renal disease: Secondary | ICD-10-CM | POA: Diagnosis not present

## 2017-12-16 DIAGNOSIS — D509 Iron deficiency anemia, unspecified: Secondary | ICD-10-CM | POA: Diagnosis not present

## 2017-12-16 DIAGNOSIS — Z23 Encounter for immunization: Secondary | ICD-10-CM | POA: Diagnosis not present

## 2017-12-16 DIAGNOSIS — E1122 Type 2 diabetes mellitus with diabetic chronic kidney disease: Secondary | ICD-10-CM | POA: Diagnosis not present

## 2017-12-16 DIAGNOSIS — N2581 Secondary hyperparathyroidism of renal origin: Secondary | ICD-10-CM | POA: Diagnosis not present

## 2017-12-18 DIAGNOSIS — E1122 Type 2 diabetes mellitus with diabetic chronic kidney disease: Secondary | ICD-10-CM | POA: Diagnosis not present

## 2017-12-18 DIAGNOSIS — D631 Anemia in chronic kidney disease: Secondary | ICD-10-CM | POA: Diagnosis not present

## 2017-12-18 DIAGNOSIS — N186 End stage renal disease: Secondary | ICD-10-CM | POA: Diagnosis not present

## 2017-12-18 DIAGNOSIS — Z23 Encounter for immunization: Secondary | ICD-10-CM | POA: Diagnosis not present

## 2017-12-18 DIAGNOSIS — N2581 Secondary hyperparathyroidism of renal origin: Secondary | ICD-10-CM | POA: Diagnosis not present

## 2017-12-18 DIAGNOSIS — D509 Iron deficiency anemia, unspecified: Secondary | ICD-10-CM | POA: Diagnosis not present

## 2017-12-19 NOTE — Telephone Encounter (Signed)
No answer. Left detail message that we have gotten her message and are happy she's had no issues, ok per DPR, and to call back if any further questions or concerns.

## 2017-12-19 NOTE — Telephone Encounter (Signed)
Patient says she took Praluent inj on Sunday no issues or concerns at this time.

## 2017-12-20 DIAGNOSIS — Z23 Encounter for immunization: Secondary | ICD-10-CM | POA: Diagnosis not present

## 2017-12-20 DIAGNOSIS — D631 Anemia in chronic kidney disease: Secondary | ICD-10-CM | POA: Diagnosis not present

## 2017-12-20 DIAGNOSIS — D509 Iron deficiency anemia, unspecified: Secondary | ICD-10-CM | POA: Diagnosis not present

## 2017-12-20 DIAGNOSIS — N186 End stage renal disease: Secondary | ICD-10-CM | POA: Diagnosis not present

## 2017-12-20 DIAGNOSIS — N2581 Secondary hyperparathyroidism of renal origin: Secondary | ICD-10-CM | POA: Diagnosis not present

## 2017-12-20 DIAGNOSIS — E1122 Type 2 diabetes mellitus with diabetic chronic kidney disease: Secondary | ICD-10-CM | POA: Diagnosis not present

## 2017-12-23 DIAGNOSIS — E1122 Type 2 diabetes mellitus with diabetic chronic kidney disease: Secondary | ICD-10-CM | POA: Diagnosis not present

## 2017-12-23 DIAGNOSIS — Z23 Encounter for immunization: Secondary | ICD-10-CM | POA: Diagnosis not present

## 2017-12-23 DIAGNOSIS — N2581 Secondary hyperparathyroidism of renal origin: Secondary | ICD-10-CM | POA: Diagnosis not present

## 2017-12-23 DIAGNOSIS — N186 End stage renal disease: Secondary | ICD-10-CM | POA: Diagnosis not present

## 2017-12-23 DIAGNOSIS — D631 Anemia in chronic kidney disease: Secondary | ICD-10-CM | POA: Diagnosis not present

## 2017-12-23 DIAGNOSIS — D509 Iron deficiency anemia, unspecified: Secondary | ICD-10-CM | POA: Diagnosis not present

## 2017-12-25 ENCOUNTER — Other Ambulatory Visit (INDEPENDENT_AMBULATORY_CARE_PROVIDER_SITE_OTHER): Payer: Self-pay | Admitting: Vascular Surgery

## 2017-12-25 DIAGNOSIS — N186 End stage renal disease: Secondary | ICD-10-CM | POA: Diagnosis not present

## 2017-12-25 DIAGNOSIS — D631 Anemia in chronic kidney disease: Secondary | ICD-10-CM | POA: Diagnosis not present

## 2017-12-25 DIAGNOSIS — Z23 Encounter for immunization: Secondary | ICD-10-CM | POA: Diagnosis not present

## 2017-12-25 DIAGNOSIS — D509 Iron deficiency anemia, unspecified: Secondary | ICD-10-CM | POA: Diagnosis not present

## 2017-12-25 DIAGNOSIS — N2581 Secondary hyperparathyroidism of renal origin: Secondary | ICD-10-CM | POA: Diagnosis not present

## 2017-12-25 DIAGNOSIS — E1122 Type 2 diabetes mellitus with diabetic chronic kidney disease: Secondary | ICD-10-CM | POA: Diagnosis not present

## 2017-12-26 ENCOUNTER — Ambulatory Visit (INDEPENDENT_AMBULATORY_CARE_PROVIDER_SITE_OTHER): Payer: Medicare Other

## 2017-12-26 ENCOUNTER — Ambulatory Visit (INDEPENDENT_AMBULATORY_CARE_PROVIDER_SITE_OTHER): Payer: Medicare Other | Admitting: Vascular Surgery

## 2017-12-26 ENCOUNTER — Encounter (INDEPENDENT_AMBULATORY_CARE_PROVIDER_SITE_OTHER): Payer: Self-pay | Admitting: Vascular Surgery

## 2017-12-26 VITALS — BP 144/73 | HR 69 | Resp 16 | Ht 64.0 in | Wt 212.4 lb

## 2017-12-26 DIAGNOSIS — I4891 Unspecified atrial fibrillation: Secondary | ICD-10-CM | POA: Diagnosis not present

## 2017-12-26 DIAGNOSIS — T829XXS Unspecified complication of cardiac and vascular prosthetic device, implant and graft, sequela: Secondary | ICD-10-CM

## 2017-12-26 DIAGNOSIS — I9789 Other postprocedural complications and disorders of the circulatory system, not elsewhere classified: Secondary | ICD-10-CM

## 2017-12-26 DIAGNOSIS — Z992 Dependence on renal dialysis: Secondary | ICD-10-CM

## 2017-12-26 DIAGNOSIS — I2510 Atherosclerotic heart disease of native coronary artery without angina pectoris: Secondary | ICD-10-CM | POA: Diagnosis not present

## 2017-12-26 DIAGNOSIS — I25709 Atherosclerosis of coronary artery bypass graft(s), unspecified, with unspecified angina pectoris: Secondary | ICD-10-CM

## 2017-12-26 DIAGNOSIS — I10 Essential (primary) hypertension: Secondary | ICD-10-CM | POA: Diagnosis not present

## 2017-12-26 DIAGNOSIS — N186 End stage renal disease: Secondary | ICD-10-CM

## 2017-12-27 DIAGNOSIS — N186 End stage renal disease: Secondary | ICD-10-CM | POA: Diagnosis not present

## 2017-12-27 DIAGNOSIS — D631 Anemia in chronic kidney disease: Secondary | ICD-10-CM | POA: Diagnosis not present

## 2017-12-27 DIAGNOSIS — E1122 Type 2 diabetes mellitus with diabetic chronic kidney disease: Secondary | ICD-10-CM | POA: Diagnosis not present

## 2017-12-27 DIAGNOSIS — N2581 Secondary hyperparathyroidism of renal origin: Secondary | ICD-10-CM | POA: Diagnosis not present

## 2017-12-27 DIAGNOSIS — D509 Iron deficiency anemia, unspecified: Secondary | ICD-10-CM | POA: Diagnosis not present

## 2017-12-27 DIAGNOSIS — Z23 Encounter for immunization: Secondary | ICD-10-CM | POA: Diagnosis not present

## 2017-12-29 ENCOUNTER — Encounter (INDEPENDENT_AMBULATORY_CARE_PROVIDER_SITE_OTHER): Payer: Self-pay | Admitting: Vascular Surgery

## 2017-12-29 NOTE — Progress Notes (Signed)
MRN : [id number]  Jamie Arias is a 62 y.o. ([birth year]) female who presents with chief complaint of  Chief Complaint  Patient presents with  . Follow-up    [facility] 3week HDA  .  History of Present Illness:   The patient returns to the office for followup of their dialysis access. The function of the access has been stable. The patient denies increased bleeding time or increased recirculation. Patient denies difficulty with cannulation. The patient denies hand pain or other symptoms consistent with steal phenomena.  No significant arm swelling.  The patient denies redness or swelling at the access site. The patient denies fever or chills at home or while on dialysis.  The patient denies amaurosis fugax or recent TIA symptoms. There are no recent neurological changes noted. The patient denies claudication symptoms or rest pain symptoms. The patient denies history of DVT, PE or superficial thrombophlebitis. The patient denies recent episodes of angina or shortness of breath.   Duplex ultrasound of the AV access shows a patent access.  The previously noted stenosis is unchanged compared to last study.       Current Meds  Medication Sig  . ACCU-CHEK FASTCLIX LANCETS MISC Check blood glucose twice daily, diagnosis code E11.9  . acetaminophen (TYLENOL) 325 MG tablet Take 650 mg by mouth daily as needed for moderate pain or headache.   . Alirocumab (PRALUENT) 75 MG/ML SOPN Inject 75 mg into the skin every 14 (fourteen) days.  Marland Kitchen aspirin EC 81 MG tablet Take 81 mg by mouth daily.  Marland Kitchen atorvastatin (LIPITOR) 80 MG tablet Take 1 tablet (80 mg total) by mouth daily.  Marland Kitchen b complex-vitamin c-folic acid (NEPHRO-VITE) 0.8 MG TABS tablet Take 1 tablet by mouth at bedtime.  . blood glucose meter kit and supplies KIT Dispense based on patient and insurance preference. Use up to twice daily. (FOR ICD-10 E11.9)  . Blood Glucose Monitoring Suppl (ACCU-CHEK AVIVA PLUS) w/Device KIT Use as directed to test  blood sugar up to three times daily  . calcium acetate (PHOSLO) 667 MG capsule Take 667 mg by mouth 3 (three) times daily with meals.  . Calcium Acetate 668 (169 Ca) MG TABS TAKE 3 TABLETS BY MOUTH THREE TIMES DAILY WITH MEALS  . ergocalciferol (VITAMIN D2) 50000 units capsule Take 50,000 Units by mouth once a week.   . ferrous sulfate 325 (65 FE) MG tablet Take 325 mg by mouth daily with breakfast.  . fluticasone (FLONASE) 50 MCG/ACT nasal spray Place 2 sprays into both nostrils daily.  . furosemide (LASIX) 20 MG tablet Take 20 mg by mouth every other day. Non-dialysis days  . gabapentin (NEURONTIN) 300 MG capsule TAKE 1 CAPSULE(300 MG) BY MOUTH THREE TIMES DAILY  . glucose blood (ACCU-CHEK AVIVA) test strip Use as instructed to test blood sugar up to 3 times daily  . Insulin Glargine (BASAGLAR KWIKPEN) 100 UNIT/ML SOPN Inject 0.25-0.35 mLs (25-35 Units total) into the skin daily. (Patient taking differently: Inject 28 Units into the skin daily. )  . Insulin Pen Needle (GLOBAL EASE INJECT PEN NEEDLES) 31G X 5 MM MISC Inject 0.1 mLs (10 Units total) into the skin at bedtime  . Lancets (ACCU-CHEK SOFT TOUCH) lancets Use as instructed  . Lancets Misc. (ACCU-CHEK SOFTCLIX LANCET DEV) KIT Use as directed to test blood sugar up to three times daily  . levothyroxine (SYNTHROID, LEVOTHROID) 75 MCG tablet TAKE 1 TABLET(75 MCG) BY MOUTH DAILY  . lidocaine-prilocaine (EMLA) cream Apply 1 application topically as  needed (port access).  . midodrine (PROAMATINE) 5 MG tablet Take 1 tablet (5 mg total) by mouth 2 (two) times daily with a meal.  . nitroGLYCERIN (NITROSTAT) 0.4 MG SL tablet DISSOLVE ONE TABLET UNDER THE TONGUE EVERY 5 MINUTES AS NEEDED FOR CHEST PAIN.  DO NOT EXCEED A TOTAL OF 3 DOSES IN 15 MINUTES  . omeprazole (PRILOSEC) 20 MG capsule Take 20 mg by mouth 2 (two) times daily before a meal.  . ondansetron (ZOFRAN-ODT) 4 MG disintegrating tablet Take 4 mg by mouth every 8 (eight) hours as needed for  nausea or vomiting.   . traZODone (DESYREL) 50 MG tablet TAKE 1 TABLET BY MOUTH EVERY NIGHT AT BEDTIME (Patient taking differently: Take 50 mg by mouth at bedtime. )    Past Medical History:  Diagnosis Date  . Anemia   . Chronic diastolic CHF (congestive heart failure) ([place])    a. Due to ischemic cardiomyopathy. EF as low as 35%, improved to normal s/p CABG; b. echo 07/06/13: EF 55-60%, no RWMAs, mod TR, trivial pericardial effusion not c/w tamponade physiology;  c. 10/2015 Echo: EF 65%, Gr1 DD, triv AI, mild MR, mildly dil LA, mod TR, PASP 19mHg.  .Marland KitchenCoronary artery disease    a. NSTEMI 06/2013; b.cath: severe three-vessel CAD w/ EF 30% & mild-mod MR; c. s/p 3 vessel CABG 07/02/13 (LIMA-LAD, SVG-OM, and SVG-RPDA);  d. 10/2015 MV: no ischemia/infarct.  . Diabetes mellitus without complication ([place]   . Diabetic neuropathy ([place]   . Dialysis patient ([facility]    MWF  . ESRD (end stage renal disease) ([place]    a. 12/2015 initiated - mwf dialysis.  .Marland KitchenGERD (gastroesophageal reflux disease)   . Hyperlipidemia   . Hypertension   . Hypothyroidism   . Myocardial infarction ([place] 2015  . Neuropathy   . Pleural effusion 2015  . Pulmonary hypertension ([place]   . Wears dentures    full lower    Past Surgical History:  Procedure Laterality Date  . A/V FISTULAGRAM Right 12/17/2016   Procedure: A/V Fistulagram;  Surgeon: DAlgernon Huxley MD;  [place] [place] [facility];  Service: Cardiovascular;  Laterality: Right;  . A/V FISTULAGRAM Right 01/07/2017   Procedure: A/V Fistulagram;  Surgeon: DAlgernon Huxley MD;  [place];  Service: Cardiovascular;  Laterality: Right;  . A/V FISTULAGRAM Right 12/03/2017   Procedure: A/V FISTULAGRAM;  Surgeon: SKatha Cabal MD;  [facility] [place];  Service: Cardiovascular;  Laterality: Right;  . A/V SHUNT INTERVENTION N/A 02/22/2017   Procedure: A/V SHUNT INTERVENTION;  Surgeon: DAlgernon Huxley MD;  [place] [facility];  Service:  Cardiovascular;  Laterality: N/A;  . AV FISTULA PLACEMENT Right 02/03/2016   Procedure: INSERTION OF ARTERIOVENOUS (AV) GORE-TEX GRAFT ARM ( BRACH / AXILLARY );  Surgeon: GKatha Cabal MD;  Location: [facility] ORS;  Service: Vascular;  Laterality: Right;  . CARDIAC CATHETERIZATION    . CATARACT EXTRACTION Bilateral   . CHOLECYSTECTOMY N/A 12/09/2014   Procedure: LAPAROSCOPIC CHOLECYSTECTOMY;  Surgeon: CMarlyce [name] MD;  Location: [facility] ORS;  Service: General;  Laterality: N/A;  . CORONARY ARTERY BYPASS GRAFT N/A 07/02/2013   Procedure: CORONARY ARTERY BYPASS GRAFTING (CABG);  Surgeon: PIvin [name] MD;  [place]  Service: Open Heart Surgery;  Laterality: N/A;  CABG x three, using left internal mammary artery and right leg greater saphenous vein harvested endoscopically  . ESOPHAGOGASTRODUODENOSCOPY (EGD) WITH PROPOFOL N/A 11/24/2015   Procedure: ESOPHAGOGASTRODUODENOSCOPY (EGD) WITH  PROPOFOL;  Surgeon: [name], MD;  Location: [place];  Service: Endoscopy;  Laterality: N/A;  Diabetic - insulin  . EYE SURGERY Bilateral    Cataract Extraction with IOL  . INTRAOPERATIVE TRANSESOPHAGEAL ECHOCARDIOGRAM N/A 07/02/2013   Procedure: INTRAOPERATIVE TRANSESOPHAGEAL ECHOCARDIOGRAM;  Surgeon: Ivin [name], MD;  Location: [place];  Service: Open Heart Surgery;  Laterality: N/A;  . PERIPHERAL VASCULAR CATHETERIZATION Right 12/06/2015   Procedure: Dialysis/Perma Catheter Insertion;  Surgeon: Katha Cabal, MD;  Location: [place] CV LAB;  Service: Cardiovascular;  Laterality: Right;  . PERIPHERAL VASCULAR THROMBECTOMY Right 09/28/2016   Procedure: Peripheral Vascular Thrombectomy;  Surgeon: Katha Cabal, MD;  Location: [place] CV LAB;  Service: Cardiovascular;  Laterality: Right;  . PORTA CATH REMOVAL N/A 06/01/2016   Procedure: Glori [name] Cath Removal;  Surgeon: Katha Cabal, MD;  Location: [place] [facility] LAB;  Service: Cardiovascular;  Laterality: N/A;  .  THORACENTESIS Left 2015    Social History Social History   Tobacco Use  . Smoking status: Never Smoker  . Smokeless tobacco: Never Used  Substance Use Topics  . Alcohol use: No    Alcohol/week: 0.0 standard drinks  . Drug use: No    Family History Family History  Problem Relation Age of Onset  . COPD Mother   . Cancer Mother        Lung  . Pulmonary embolism Father   . Diabetes Father   . Diabetes Paternal Grandfather   . Heart disease Maternal Grandmother   . Colon cancer Neg Hx   . Colon polyps Neg Hx   . Esophageal cancer Neg Hx   . Pancreatic cancer Neg Hx   . Liver disease Neg Hx     Allergies  Allergen Reactions  . Nsaids Other (See Comments)    Contraindicated due to kidney disease.  Marland Kitchen Doxycycline Other (See Comments)    tremor     REVIEW OF SYSTEMS (Negative unless checked)  Constitutional: '[]'$ Weight loss  '[]'$ Fever  '[]'$ Chills Cardiac: '[]'$ Chest pain   '[]'$ Chest pressure   '[]'$ Palpitations   '[]'$ Shortness of breath when laying flat   '[]'$ Shortness of breath with exertion. Vascular:  '[]'$ Pain in legs with walking   '[]'$ Pain in legs at rest  '[]'$ History of DVT   '[]'$ Phlebitis   '[]'$ Swelling in legs   '[]'$ Varicose veins   '[]'$ Non-healing ulcers Pulmonary:   '[]'$ Uses home oxygen   '[]'$ Productive cough   '[]'$ Hemoptysis   '[]'$ Wheeze  '[]'$ COPD   '[]'$ Asthma Neurologic:  '[]'$ Dizziness   '[]'$ Seizures   '[]'$ History of stroke   '[]'$ History of TIA  '[]'$ Aphasia   '[]'$ Vissual changes   '[]'$ Weakness or numbness in arm   '[]'$ Weakness or numbness in leg Musculoskeletal:   '[]'$ Joint swelling   '[]'$ Joint pain   '[]'$ Low back pain Hematologic:  '[]'$ Easy bruising  '[]'$ Easy bleeding   '[]'$ Hypercoagulable state   '[]'$ Anemic Gastrointestinal:  '[]'$ Diarrhea   '[]'$ Vomiting  '[]'$ Gastroesophageal reflux/heartburn   '[]'$ Difficulty swallowing. Genitourinary:  '[x]'$ Chronic kidney disease   '[]'$ Difficult urination  '[]'$ Frequent urination   '[]'$ Blood in urine Skin:  '[]'$ Rashes   '[]'$ Ulcers  Psychological:  '[]'$ History of anxiety   '[]'$  History of major depression.  Physical  Examination  Vitals:   12/26/17 1619  BP: (!) 144/73  Pulse: 69  Resp: 16  Weight: 212 lb 6.4 oz (96.3 kg)  Height: '5\' 4"'$  (1.626 m)   Body mass index is 36.46 kg/m. Gen: WD/WN, NAD Head: [place]/AT, No temporalis wasting.  Ear/Nose/Throat: Hearing grossly intact, nares w/o erythema or drainage Eyes: PER,  EOMI, sclera nonicteric.  Neck: Supple, no large masses.   Pulmonary:  Good air movement, no audible wheezing bilaterally, no use of accessory muscles.  Cardiac: RRR, no JVD Vascular: right arm av access good thrill good bruit Vessel Right Left  Radial Palpable Palpable  Brachial Palpable Palpable  Carotid Palpable Palpable  Gastrointestinal: Non-distended. No guarding/no peritoneal signs.  Musculoskeletal: M/S 5/5 throughout.  No deformity or atrophy.  Neurologic: CN 2-12 intact. Symmetrical.  Speech is fluent. Motor exam as listed above. Psychiatric: Judgment intact, Mood & affect appropriate for pt's clinical situation. Dermatologic: No rashes or ulcers noted.  No changes consistent with cellulitis. Lymph : No lichenification or skin changes of chronic lymphedema.  CBC Lab Results  Component Value Date   WBC 6.2 09/05/2017   HGB 12.1 09/05/2017   HCT 35.3 09/05/2017   MCV 95.5 09/05/2017   PLT 212 09/05/2017    BMET    Component Value Date/Time   NA 142 09/05/2017 0436   NA 140 03/30/2014 1611   K 4.2 09/05/2017 0436   K 4.3 03/30/2014 1611   CL 101 09/05/2017 0436   CL 105 03/30/2014 1611   CO2 27 09/05/2017 0436   CO2 29 03/30/2014 1611   GLUCOSE 84 09/05/2017 0436   GLUCOSE 187 (H) 03/30/2014 1611   BUN 47 (H) 09/05/2017 0436   BUN 21 (H) 03/30/2014 1611   CREATININE 10.14 (H) 09/05/2017 0436   CREATININE 3.14 (H) 09/09/2015 1534   CALCIUM 8.4 (L) 09/05/2017 0436   CALCIUM 9.0 03/30/2014 1611   GFRNONAA 4 (L) 09/05/2017 0436   GFRNONAA 36 (L) 03/30/2014 1611   GFRNONAA >60 07/23/2013 0741   GFRAA 4 (L) 09/05/2017 0436   GFRAA 44 (L) 03/30/2014 1611    GFRAA >60 07/23/2013 0741   CrCl cannot be calculated (Patient's most recent lab result is older than the maximum 21 days allowed.).  COAG Lab Results  Component Value Date   INR 0.99 01/30/2016   INR 1.19 07/13/2013   INR 1.27 07/12/2013    Radiology No results found.   Assessment/Plan 1. Complication from renal dialysis device, sequela Recommend:  The patient is doing well and currently has adequate dialysis access. The patient's dialysis center is not reporting any major access issues.  However, the flow rates in the patient's dialysis access are acceptable there is a stenosis identified that has not yet reach hemodynamic significance.  This raises concerns that the access is at moderate but not high risk for a problem or thrombosis and should be followed on a regular basis.  The patient will follow-up with me in the office in 6 months without an HDA  - VAS US DUPLEX DIALYSIS ACCESS (AVF, AVG); Future  2. ESRD on dialysis [facility]) Continue dialysis without interuption  3. Coronary artery disease involving coronary bypass graft of native heart with angina pectoris ([place]) Continue cardiac and antihypertensive medications as already ordered and reviewed, no changes at this time.  Continue statin as ordered and reviewed, no changes at this time  Nitrates PRN for chest pain   4. Essential hypertension Continue antihypertensive medications as already ordered, these medications have been reviewed and there are no changes at this time.   5. Postoperative atrial fibrillation (HCC) Continue antiarrhythmia medications as already ordered, these medications have been reviewed and there are no changes at this time.  Continue anticoagulation as ordered by Cardiology Service     Hortencia Pilar, MD  12/29/2017 12:01 PM

## 2017-12-30 DIAGNOSIS — N2581 Secondary hyperparathyroidism of renal origin: Secondary | ICD-10-CM | POA: Diagnosis not present

## 2017-12-30 DIAGNOSIS — E1122 Type 2 diabetes mellitus with diabetic chronic kidney disease: Secondary | ICD-10-CM | POA: Diagnosis not present

## 2017-12-30 DIAGNOSIS — D631 Anemia in chronic kidney disease: Secondary | ICD-10-CM | POA: Diagnosis not present

## 2017-12-30 DIAGNOSIS — N186 End stage renal disease: Secondary | ICD-10-CM | POA: Diagnosis not present

## 2017-12-30 DIAGNOSIS — D509 Iron deficiency anemia, unspecified: Secondary | ICD-10-CM | POA: Diagnosis not present

## 2017-12-30 DIAGNOSIS — Z23 Encounter for immunization: Secondary | ICD-10-CM | POA: Diagnosis not present

## 2018-01-01 DIAGNOSIS — E1122 Type 2 diabetes mellitus with diabetic chronic kidney disease: Secondary | ICD-10-CM | POA: Diagnosis not present

## 2018-01-01 DIAGNOSIS — D631 Anemia in chronic kidney disease: Secondary | ICD-10-CM | POA: Diagnosis not present

## 2018-01-01 DIAGNOSIS — Z23 Encounter for immunization: Secondary | ICD-10-CM | POA: Diagnosis not present

## 2018-01-01 DIAGNOSIS — N186 End stage renal disease: Secondary | ICD-10-CM | POA: Diagnosis not present

## 2018-01-01 DIAGNOSIS — N2581 Secondary hyperparathyroidism of renal origin: Secondary | ICD-10-CM | POA: Diagnosis not present

## 2018-01-01 DIAGNOSIS — D509 Iron deficiency anemia, unspecified: Secondary | ICD-10-CM | POA: Diagnosis not present

## 2018-01-02 DIAGNOSIS — Z992 Dependence on renal dialysis: Secondary | ICD-10-CM | POA: Diagnosis not present

## 2018-01-02 DIAGNOSIS — N186 End stage renal disease: Secondary | ICD-10-CM | POA: Diagnosis not present

## 2018-01-03 ENCOUNTER — Ambulatory Visit: Payer: Medicare Other | Admitting: Family Medicine

## 2018-01-03 DIAGNOSIS — N2581 Secondary hyperparathyroidism of renal origin: Secondary | ICD-10-CM | POA: Diagnosis not present

## 2018-01-03 DIAGNOSIS — E1122 Type 2 diabetes mellitus with diabetic chronic kidney disease: Secondary | ICD-10-CM | POA: Diagnosis not present

## 2018-01-03 DIAGNOSIS — D509 Iron deficiency anemia, unspecified: Secondary | ICD-10-CM | POA: Diagnosis not present

## 2018-01-03 DIAGNOSIS — N186 End stage renal disease: Secondary | ICD-10-CM | POA: Diagnosis not present

## 2018-01-06 ENCOUNTER — Other Ambulatory Visit: Payer: Self-pay | Admitting: Family Medicine

## 2018-01-06 DIAGNOSIS — N186 End stage renal disease: Secondary | ICD-10-CM | POA: Diagnosis not present

## 2018-01-06 DIAGNOSIS — E1122 Type 2 diabetes mellitus with diabetic chronic kidney disease: Secondary | ICD-10-CM | POA: Diagnosis not present

## 2018-01-06 DIAGNOSIS — D509 Iron deficiency anemia, unspecified: Secondary | ICD-10-CM | POA: Diagnosis not present

## 2018-01-06 DIAGNOSIS — N2581 Secondary hyperparathyroidism of renal origin: Secondary | ICD-10-CM | POA: Diagnosis not present

## 2018-01-06 NOTE — Telephone Encounter (Signed)
Copied from [place] [id number]. Topic: Quick Communication - See Telephone Encounter >> Jan 06, 2018 10:44 AM Loma Boston wrote: CRM for notification. See Telephone encounter for: 01/06/18 .Furosemide (LASIX) 20 MG tablet PT husband  Latoy Labriola says wife has always taken this med but is having trouble getting refilled. Has called before with no success. Contact [phone]  Use [facility] on [address] at [place]  Fax [phone]

## 2018-01-07 NOTE — Telephone Encounter (Signed)
Requested medication (s) are due for refill today: yes  Requested medication (s) are on the active medication list: yes  Last refill:  ?  Future visit scheduled: yes  Notes to clinic:  Historical provider    Requested Prescriptions  Pending Prescriptions Disp Refills   furosemide (LASIX) 20 MG tablet 30 tablet     Sig: Take 1 tablet (20 mg total) by mouth every other day. Non-dialysis days     Cardiovascular:  Diuretics - Loop Failed - 01/06/2018 12:09 PM      Failed - Ca in normal range and within 360 days    Calcium  Date Value Ref Range Status  09/05/2017 8.4 (L) 8.9 - 10.3 mg/dL Final   Calcium, Total  Date Value Ref Range Status  03/30/2014 9.0 8.5 - 10.1 mg/dL Final         Failed - Cr in normal range and within 360 days    Creat  Date Value Ref Range Status  09/09/2015 3.14 (H) 0.50 - 0.99 mg/dL Final    Comment:      For patients > or = 64 years of age: The upper reference limit for Creatinine is approximately 13% higher for people identified as African-American.      Creatinine, Ser  Date Value Ref Range Status  09/05/2017 10.14 (H) 0.44 - 1.00 mg/dL Final         Failed - Last BP in normal range    BP Readings from Last 1 Encounters:  12/26/17 (!) 144/73         Passed - K in normal range and within 360 days    Potassium  Date Value Ref Range Status  09/05/2017 4.2 3.5 - 5.1 mmol/L Final  03/30/2014 4.3 3.5 - 5.1 mmol/L Final   Potassium [facility] vascular lab)  Date Value Ref Range Status  12/03/2017 4.8 3.5 - 5.1 Final    Comment:    Performed at [facility], [address], [place], [place] [address]         Passed - Na in normal range and within 360 days    Sodium  Date Value Ref Range Status  09/05/2017 142 135 - 145 mmol/L Final  03/30/2014 140 136 - 145 mmol/L Final         Passed - Valid encounter within last 6 months    Recent Outpatient Visits          3 months ago Hypotension, unspecified hypotension type   [facility] [name], MD   4 months ago ESRD on dialysis [facility])   [facility] [name], MD   4 months ago Type 2 diabetes mellitus with diabetic neuropathy, without long-term current use of insulin [facility])   [place], [place], [place]   5 months ago Chronic neck pain   [facility] [place], Angela Adam, MD   6 months ago Type 2 diabetes mellitus with diabetic neuropathy, with long-term current use of insulin [facility])   [place], Johny Drilling, [facility]      Future Appointments            In 2 months Caryl Bis, Angela Adam, MD [facility], [facility]

## 2018-01-08 DIAGNOSIS — N2581 Secondary hyperparathyroidism of renal origin: Secondary | ICD-10-CM | POA: Diagnosis not present

## 2018-01-08 DIAGNOSIS — D509 Iron deficiency anemia, unspecified: Secondary | ICD-10-CM | POA: Diagnosis not present

## 2018-01-08 DIAGNOSIS — N186 End stage renal disease: Secondary | ICD-10-CM | POA: Diagnosis not present

## 2018-01-08 DIAGNOSIS — E1122 Type 2 diabetes mellitus with diabetic chronic kidney disease: Secondary | ICD-10-CM | POA: Diagnosis not present

## 2018-01-08 NOTE — Telephone Encounter (Signed)
Patient last seen on 7/16 but I don't see where this has been filled by you. Ok to refill?

## 2018-01-08 NOTE — Telephone Encounter (Signed)
I called patient & LVM for her to return call regarding prescription.

## 2018-01-08 NOTE — Telephone Encounter (Signed)
Please contact the patient and find out if she is taking this daily.  Please find out from the patient who has been prescribing this.  It appears in the past her cardiologist was prescribing it though her nephrologist may be prescribing it now.

## 2018-01-10 DIAGNOSIS — D509 Iron deficiency anemia, unspecified: Secondary | ICD-10-CM | POA: Diagnosis not present

## 2018-01-10 DIAGNOSIS — N186 End stage renal disease: Secondary | ICD-10-CM | POA: Diagnosis not present

## 2018-01-10 DIAGNOSIS — N2581 Secondary hyperparathyroidism of renal origin: Secondary | ICD-10-CM | POA: Diagnosis not present

## 2018-01-10 DIAGNOSIS — E1122 Type 2 diabetes mellitus with diabetic chronic kidney disease: Secondary | ICD-10-CM | POA: Diagnosis not present

## 2018-01-10 NOTE — Telephone Encounter (Signed)
Patient has already gotten medication. Nephrologist has filled.

## 2018-01-10 NOTE — Telephone Encounter (Signed)
LVM again for patient to call back regarding prescription.

## 2018-01-13 DIAGNOSIS — N186 End stage renal disease: Secondary | ICD-10-CM | POA: Diagnosis not present

## 2018-01-13 DIAGNOSIS — N2581 Secondary hyperparathyroidism of renal origin: Secondary | ICD-10-CM | POA: Diagnosis not present

## 2018-01-13 DIAGNOSIS — E1122 Type 2 diabetes mellitus with diabetic chronic kidney disease: Secondary | ICD-10-CM | POA: Diagnosis not present

## 2018-01-13 DIAGNOSIS — D509 Iron deficiency anemia, unspecified: Secondary | ICD-10-CM | POA: Diagnosis not present

## 2018-01-15 DIAGNOSIS — D509 Iron deficiency anemia, unspecified: Secondary | ICD-10-CM | POA: Diagnosis not present

## 2018-01-15 DIAGNOSIS — N186 End stage renal disease: Secondary | ICD-10-CM | POA: Diagnosis not present

## 2018-01-15 DIAGNOSIS — N2581 Secondary hyperparathyroidism of renal origin: Secondary | ICD-10-CM | POA: Diagnosis not present

## 2018-01-15 DIAGNOSIS — E1122 Type 2 diabetes mellitus with diabetic chronic kidney disease: Secondary | ICD-10-CM | POA: Diagnosis not present

## 2018-01-17 DIAGNOSIS — N2581 Secondary hyperparathyroidism of renal origin: Secondary | ICD-10-CM | POA: Diagnosis not present

## 2018-01-17 DIAGNOSIS — D509 Iron deficiency anemia, unspecified: Secondary | ICD-10-CM | POA: Diagnosis not present

## 2018-01-17 DIAGNOSIS — N186 End stage renal disease: Secondary | ICD-10-CM | POA: Diagnosis not present

## 2018-01-17 DIAGNOSIS — E1122 Type 2 diabetes mellitus with diabetic chronic kidney disease: Secondary | ICD-10-CM | POA: Diagnosis not present

## 2018-01-20 DIAGNOSIS — E1122 Type 2 diabetes mellitus with diabetic chronic kidney disease: Secondary | ICD-10-CM | POA: Diagnosis not present

## 2018-01-20 DIAGNOSIS — N186 End stage renal disease: Secondary | ICD-10-CM | POA: Diagnosis not present

## 2018-01-20 DIAGNOSIS — N2581 Secondary hyperparathyroidism of renal origin: Secondary | ICD-10-CM | POA: Diagnosis not present

## 2018-01-20 DIAGNOSIS — D509 Iron deficiency anemia, unspecified: Secondary | ICD-10-CM | POA: Diagnosis not present

## 2018-01-22 DIAGNOSIS — D509 Iron deficiency anemia, unspecified: Secondary | ICD-10-CM | POA: Diagnosis not present

## 2018-01-22 DIAGNOSIS — E1122 Type 2 diabetes mellitus with diabetic chronic kidney disease: Secondary | ICD-10-CM | POA: Diagnosis not present

## 2018-01-22 DIAGNOSIS — N2581 Secondary hyperparathyroidism of renal origin: Secondary | ICD-10-CM | POA: Diagnosis not present

## 2018-01-22 DIAGNOSIS — N186 End stage renal disease: Secondary | ICD-10-CM | POA: Diagnosis not present

## 2018-01-24 DIAGNOSIS — N186 End stage renal disease: Secondary | ICD-10-CM | POA: Diagnosis not present

## 2018-01-24 DIAGNOSIS — E1122 Type 2 diabetes mellitus with diabetic chronic kidney disease: Secondary | ICD-10-CM | POA: Diagnosis not present

## 2018-01-24 DIAGNOSIS — N2581 Secondary hyperparathyroidism of renal origin: Secondary | ICD-10-CM | POA: Diagnosis not present

## 2018-01-24 DIAGNOSIS — D509 Iron deficiency anemia, unspecified: Secondary | ICD-10-CM | POA: Diagnosis not present

## 2018-01-26 DIAGNOSIS — E1122 Type 2 diabetes mellitus with diabetic chronic kidney disease: Secondary | ICD-10-CM | POA: Diagnosis not present

## 2018-01-26 DIAGNOSIS — N186 End stage renal disease: Secondary | ICD-10-CM | POA: Diagnosis not present

## 2018-01-26 DIAGNOSIS — N2581 Secondary hyperparathyroidism of renal origin: Secondary | ICD-10-CM | POA: Diagnosis not present

## 2018-01-26 DIAGNOSIS — D509 Iron deficiency anemia, unspecified: Secondary | ICD-10-CM | POA: Diagnosis not present

## 2018-01-28 DIAGNOSIS — E1122 Type 2 diabetes mellitus with diabetic chronic kidney disease: Secondary | ICD-10-CM | POA: Diagnosis not present

## 2018-01-28 DIAGNOSIS — N186 End stage renal disease: Secondary | ICD-10-CM | POA: Diagnosis not present

## 2018-01-28 DIAGNOSIS — N2581 Secondary hyperparathyroidism of renal origin: Secondary | ICD-10-CM | POA: Diagnosis not present

## 2018-01-28 DIAGNOSIS — D509 Iron deficiency anemia, unspecified: Secondary | ICD-10-CM | POA: Diagnosis not present

## 2018-01-31 DIAGNOSIS — D509 Iron deficiency anemia, unspecified: Secondary | ICD-10-CM | POA: Diagnosis not present

## 2018-01-31 DIAGNOSIS — N186 End stage renal disease: Secondary | ICD-10-CM | POA: Diagnosis not present

## 2018-01-31 DIAGNOSIS — N2581 Secondary hyperparathyroidism of renal origin: Secondary | ICD-10-CM | POA: Diagnosis not present

## 2018-01-31 DIAGNOSIS — E1122 Type 2 diabetes mellitus with diabetic chronic kidney disease: Secondary | ICD-10-CM | POA: Diagnosis not present

## 2018-02-01 DIAGNOSIS — N186 End stage renal disease: Secondary | ICD-10-CM | POA: Diagnosis not present

## 2018-02-01 DIAGNOSIS — Z992 Dependence on renal dialysis: Secondary | ICD-10-CM | POA: Diagnosis not present

## 2018-02-03 DIAGNOSIS — E1122 Type 2 diabetes mellitus with diabetic chronic kidney disease: Secondary | ICD-10-CM | POA: Diagnosis not present

## 2018-02-03 DIAGNOSIS — N186 End stage renal disease: Secondary | ICD-10-CM | POA: Diagnosis not present

## 2018-02-03 DIAGNOSIS — N2581 Secondary hyperparathyroidism of renal origin: Secondary | ICD-10-CM | POA: Diagnosis not present

## 2018-02-03 DIAGNOSIS — D631 Anemia in chronic kidney disease: Secondary | ICD-10-CM | POA: Diagnosis not present

## 2018-02-04 ENCOUNTER — Other Ambulatory Visit: Payer: Self-pay | Admitting: Neurology

## 2018-02-04 DIAGNOSIS — R2689 Other abnormalities of gait and mobility: Secondary | ICD-10-CM | POA: Diagnosis not present

## 2018-02-04 DIAGNOSIS — M542 Cervicalgia: Secondary | ICD-10-CM | POA: Diagnosis not present

## 2018-02-04 DIAGNOSIS — R251 Tremor, unspecified: Secondary | ICD-10-CM | POA: Diagnosis not present

## 2018-02-04 DIAGNOSIS — E1142 Type 2 diabetes mellitus with diabetic polyneuropathy: Secondary | ICD-10-CM | POA: Diagnosis not present

## 2018-02-05 DIAGNOSIS — N2581 Secondary hyperparathyroidism of renal origin: Secondary | ICD-10-CM | POA: Diagnosis not present

## 2018-02-05 DIAGNOSIS — E1122 Type 2 diabetes mellitus with diabetic chronic kidney disease: Secondary | ICD-10-CM | POA: Diagnosis not present

## 2018-02-05 DIAGNOSIS — N186 End stage renal disease: Secondary | ICD-10-CM | POA: Diagnosis not present

## 2018-02-05 DIAGNOSIS — D631 Anemia in chronic kidney disease: Secondary | ICD-10-CM | POA: Diagnosis not present

## 2018-02-07 ENCOUNTER — Other Ambulatory Visit: Payer: Self-pay | Admitting: Cardiovascular Disease

## 2018-02-07 DIAGNOSIS — D631 Anemia in chronic kidney disease: Secondary | ICD-10-CM | POA: Diagnosis not present

## 2018-02-07 DIAGNOSIS — E1122 Type 2 diabetes mellitus with diabetic chronic kidney disease: Secondary | ICD-10-CM | POA: Diagnosis not present

## 2018-02-07 DIAGNOSIS — N186 End stage renal disease: Secondary | ICD-10-CM | POA: Diagnosis not present

## 2018-02-07 DIAGNOSIS — N2581 Secondary hyperparathyroidism of renal origin: Secondary | ICD-10-CM | POA: Diagnosis not present

## 2018-02-10 DIAGNOSIS — D631 Anemia in chronic kidney disease: Secondary | ICD-10-CM | POA: Diagnosis not present

## 2018-02-10 DIAGNOSIS — N186 End stage renal disease: Secondary | ICD-10-CM | POA: Diagnosis not present

## 2018-02-10 DIAGNOSIS — N2581 Secondary hyperparathyroidism of renal origin: Secondary | ICD-10-CM | POA: Diagnosis not present

## 2018-02-10 DIAGNOSIS — E1122 Type 2 diabetes mellitus with diabetic chronic kidney disease: Secondary | ICD-10-CM | POA: Diagnosis not present

## 2018-02-11 ENCOUNTER — Ambulatory Visit (INDEPENDENT_AMBULATORY_CARE_PROVIDER_SITE_OTHER): Payer: Medicare Other | Admitting: Cardiovascular Disease

## 2018-02-11 ENCOUNTER — Encounter: Payer: Self-pay | Admitting: Cardiovascular Disease

## 2018-02-11 VITALS — BP 110/50 | HR 64 | Ht 64.0 in | Wt 216.0 lb

## 2018-02-11 DIAGNOSIS — I5022 Chronic systolic (congestive) heart failure: Secondary | ICD-10-CM

## 2018-02-11 DIAGNOSIS — Z79899 Other long term (current) drug therapy: Secondary | ICD-10-CM

## 2018-02-11 DIAGNOSIS — I2510 Atherosclerotic heart disease of native coronary artery without angina pectoris: Secondary | ICD-10-CM

## 2018-02-11 DIAGNOSIS — E785 Hyperlipidemia, unspecified: Secondary | ICD-10-CM | POA: Diagnosis not present

## 2018-02-11 NOTE — Patient Instructions (Signed)
Medication Instructions:  - Your physician recommends that you continue on your current medications as directed. Please refer to the Current Medication list given to you today.  If you need a refill on your cardiac medications before your next appointment, please call your pharmacy.   Lab work: - Your physician recommends that you have lab work today: [place] LDL  If you have labs (blood work) drawn today and your tests are completely normal, you will receive your results only by: Marland Kitchen MyChart Message (if you have MyChart) OR . A paper copy in the mail If you have any lab test that is abnormal or we need to change your treatment, we will call you to review the results.  Testing/Procedures: - Your physician has requested that you have an echocardiogram (on a Tuesday or Thursday). Echocardiography is a painless test that uses sound waves to create images of your heart. It provides your doctor with information about the size and shape of your heart and how well your heart's chambers and valves are working. This procedure takes approximately one hour. There are no restrictions for this procedure.  Follow-Up: At [facility], you and your health needs are our priority.  As part of our continuing mission to provide you with exceptional heart care, we have created designated Provider Care Teams.  These Care Teams include your primary Cardiologist (physician) and Advanced Practice Providers (APPs -  Physician Assistants and Nurse Practitioners) who all work together to provide you with the care you need, when you need it. You will need a follow up appointment in 6 months.  Please call our office 2 months in advance to schedule this appointment.  You may see Kathlyn [place], MD or one of the following Advanced Practice Providers on your designated Care Team:   Murray Hodgkins, NP Christell Faith, PA-C . Marrianne Mood, PA-C  Any Other Special Instructions Will Be Listed Below (If  Applicable). - N/A

## 2018-02-11 NOTE — Progress Notes (Signed)
Cardiology Office Note   Date:  02/11/2018   ID:  Jamie Arias, DOB 09-22-1955, MRN [id number]  PCP:  [name], MD  Cardiologist:   Kathlyn Sacramento, MD   Chief Complaint  Patient presents with  . OTHER    18monthf/u c/o balance issues. Meds reviewed verbally with pt.      History of Present Illness: Jamie Arias a 62y.o. female who presents for a followup regarding coronary artery disease status post CABG and chronic diastolic heart failure. She has known history of diabetes, end-stage renal disease on hemodialysis, anemia, hypertension and hyperlipidemia.  She is status post CABG in April 2015.    She was started on dialysis in October 2017.  Dialysis days are Mondays, Wednesdays and Fridays.  Antihypertensive medications were discontinued due to hypotension during dialysis. Ejection fraction was previously reduced but most recent echocardiogram in August 2017 showed normal LV systolic function, mild mitral regurgitation and mild pulmonary hypertension.  The patient has issues with orthostatic hypotension which has been managed with midodrine with partial improvement.  She is also having problems with her balance and she is seeing Dr. SManuella Ghaziin neurology.  She is using the wheelchair most of the time.  She has known history of severe hyperlipidemia and did not respond well to Zetia.  Thus, Praluent was added.    Past Medical History:  Diagnosis Date  . Anemia   . Chronic diastolic CHF (congestive heart failure) (HC[facility] [place]    a. Due to ischemic cardiomyopathy. EF as low as 35%, improved to normal s/p CABG; b. echo 07/06/13: EF 55-60%, no RWMAs, mod TR, trivial pericardial effusion not c/w tamponade physiology;  c. 10/2015 Echo: EF 65%, Gr1 DD, triv AI, mild MR, mildly dil LA, mod TR, PASP 421mg.  . Marland Kitchenoronary artery disease    a. NSTEMI 06/2013; b.cath: severe three-vessel CAD w/ EF 30% & mild-mod MR; c. s/p 3 vessel CABG 07/02/13 (LIMA-LAD, SVG-OM, and SVG-RPDA);  d.  10/2015 MV: no ischemia/infarct.  . Diabetes mellitus without complication (HC[facility] [place]  . Diabetic neuropathy (H[facility]  . [facility]  . ESRD (end stage renal disease) (HCL[place]   a. 12/2015 initiated - mwf dialysis.  . Marland KitchenERD (gastroesophageal reflux disease)   . Hyperlipidemia   . Hypertension   . Hypothyroidism   . Myocardial infarction (HCF[place]2015  . Neuropathy   . Pleural effusion 2015  . Pulmonary hypertension (HCS[facility]  . Wears dentures    full lower    Past Surgical History:  Procedure Laterality Date  . A/V FISTULAGRAM Right 12/17/2016   Procedure: A/V Fistulagram;  Surgeon: DeAlgernon HuxleyMD;  Location: [facility] LAB;  Service: Cardiovascular;  Laterality: Right;  . A/V FISTULAGRAM Right 01/07/2017   Procedure: A/V Fistulagram;  Surgeon: DeAlgernon HuxleyMD;  Location: [facility] LAB;  Service: Cardiovascular;  Laterality: Right;  . A/V FISTULAGRAM Right 12/03/2017   Procedure: A/V FISTULAGRAM;  Surgeon: ScKatha CabalMD;  Location: AROtt[facility]V LAB;  Service: Cardiovascular;  Laterality: Right;  . A/V SHUNT INTERVENTION N/A 02/22/2017   Procedure: A/V SHUNT INTERVENTION;  Surgeon: DeAlgernon HuxleyMD;  Location: [facility] LAB;  Service: Cardiovascular;  Laterality: N/A;  . AV FISTULA PLACEMENT Right 02/03/2016   Procedure: INSERTION OF ARTERIOVENOUS (AV) GORE-TEX GRAFT ARM ( BRACH / AXILLARY );  Surgeon: GrKatha CabalMD;  [facility];  Service: Vascular;  Laterality: Right;  . CARDIAC CATHETERIZATION    . CATARACT EXTRACTION Bilateral   . CHOLECYSTECTOMY N/A 12/09/2014   Procedure: LAPAROSCOPIC CHOLECYSTECTOMY;  Surgeon: Marlyce [name], MD;  [facility];  Service: General;  Laterality: N/A;  . CORONARY ARTERY BYPASS GRAFT N/A 07/02/2013   Procedure: CORONARY ARTERY BYPASS GRAFTING (CABG);  Surgeon: Ivin [name], MD;  Location: [place];  Service: Open Heart Surgery;  Laterality: N/A;  CABG x three, using left internal  mammary artery and right leg greater saphenous vein harvested endoscopically  . ESOPHAGOGASTRODUODENOSCOPY (EGD) WITH PROPOFOL N/A 11/24/2015   Procedure: ESOPHAGOGASTRODUODENOSCOPY (EGD) WITH PROPOFOL;  Surgeon: [name], MD;  Location: [place];  Service: Endoscopy;  Laterality: N/A;  Diabetic - insulin  . EYE SURGERY Bilateral    Cataract Extraction with IOL  . INTRAOPERATIVE TRANSESOPHAGEAL ECHOCARDIOGRAM N/A 07/02/2013   Procedure: INTRAOPERATIVE TRANSESOPHAGEAL ECHOCARDIOGRAM;  Surgeon: Ivin [name], MD;  Location: [place];  Service: Open Heart Surgery;  Laterality: N/A;  . PERIPHERAL VASCULAR CATHETERIZATION Right 12/06/2015   Procedure: Dialysis/Perma Catheter Insertion;  Surgeon: Katha Cabal, MD;  Location: [place] CV LAB;  Service: Cardiovascular;  Laterality: Right;  . PERIPHERAL VASCULAR THROMBECTOMY Right 09/28/2016   Procedure: Peripheral Vascular Thrombectomy;  Surgeon: Katha Cabal, MD;  Location: [place] CV LAB;  Service: Cardiovascular;  Laterality: Right;  . PORTA CATH REMOVAL N/A 06/01/2016   Procedure: Glori [name] Cath Removal;  Surgeon: Katha Cabal, MD;  Location: [place] CV LAB;  Service: Cardiovascular;  Laterality: N/A;  . THORACENTESIS Left 2015     Current Outpatient Medications  Medication Sig Dispense Refill  . ACCU-CHEK FASTCLIX LANCETS MISC Check blood glucose twice daily, diagnosis code E11.9 204 each 3  . acetaminophen (TYLENOL) 325 MG tablet Take 650 mg by mouth daily as needed for moderate pain or headache.     . Alirocumab (PRALUENT) 75 MG/ML SOPN Inject 75 mg into the skin every 14 (fourteen) days. 2 pen 11  . aspirin EC 81 MG tablet Take 81 mg by mouth daily.    Marland Kitchen atorvastatin (LIPITOR) 80 MG tablet Take 1 tablet (80 mg total) by mouth daily. 90 tablet 3  . b complex-vitamin c-folic acid (NEPHRO-VITE) 0.8 MG TABS tablet Take 1 tablet by mouth at bedtime.    . blood glucose meter kit and supplies KIT Dispense based  on patient and insurance preference. Use up to twice daily. (FOR ICD-10 E11.9) 1 each 0  . Blood Glucose Monitoring Suppl (ACCU-CHEK AVIVA PLUS) w/Device KIT Use as directed to test blood sugar up to three times daily 1 kit 0  . Calcium Acetate 668 (169 Ca) MG TABS TAKE 3 TABLETS BY MOUTH THREE TIMES DAILY WITH MEALS  6  . ergocalciferol (VITAMIN D2) 50000 units capsule Take 50,000 Units by mouth once a week.     . ferrous sulfate 325 (65 FE) MG tablet Take 325 mg by mouth daily with breakfast.    . fluticasone (FLONASE) 50 MCG/ACT nasal spray Place 2 sprays into both nostrils daily. 16 g 6  . furosemide (LASIX) 20 MG tablet Take 20 mg by mouth every other day. Non-dialysis days    . gabapentin (NEURONTIN) 300 MG capsule TAKE 1 CAPSULE(300 MG) BY MOUTH THREE TIMES DAILY 90 capsule 3  . glucose blood (ACCU-CHEK AVIVA) test strip Use as instructed to test blood sugar up to 3 times daily 100 each 12  . Insulin Glargine (BASAGLAR KWIKPEN) 100 UNIT/ML SOPN Inject 0.25-0.35 mLs (25-35 Units total)  into the skin daily. (Patient taking differently: Inject 28 Units into the skin daily. ) 15 mL 6  . Insulin Pen Needle (GLOBAL EASE INJECT PEN NEEDLES) 31G X 5 MM MISC Inject 0.1 mLs (10 Units total) into the skin at bedtime 90 each 2  . Lancets (ACCU-CHEK SOFT TOUCH) lancets Use as instructed 100 each 12  . Lancets Misc. (ACCU-CHEK SOFTCLIX LANCET DEV) KIT Use as directed to test blood sugar up to three times daily 1 kit 0  . levothyroxine (SYNTHROID, LEVOTHROID) 75 MCG tablet TAKE 1 TABLET(75 MCG) BY MOUTH DAILY 90 tablet 0  . lidocaine-prilocaine (EMLA) cream Apply 1 application topically as needed (port access).    . midodrine (PROAMATINE) 10 MG tablet Take 10 mg by mouth 2 (two) times daily. Only on dialysis days.    . midodrine (PROAMATINE) 5 MG tablet Take 1 tablet (5 mg total) by mouth 2 (two) times daily with a meal. 180 tablet 3  . nitroGLYCERIN (NITROSTAT) 0.4 MG SL tablet DISSOLVE ONE TABLET UNDER  THE TONGUE EVERY 5 MINUTES AS NEEDED FOR CHEST PAIN.  DO NOT EXCEED A TOTAL OF 3 DOSES IN 15 MINUTES 25 tablet 0  . omeprazole (PRILOSEC) 20 MG capsule Take 20 mg by mouth 2 (two) times daily before a meal.    . ondansetron (ZOFRAN-ODT) 4 MG disintegrating tablet Take 4 mg by mouth every 8 (eight) hours as needed for nausea or vomiting.     . traZODone (DESYREL) 50 MG tablet TAKE 1 TABLET BY MOUTH EVERY NIGHT AT BEDTIME (Patient taking differently: Take 50 mg by mouth at bedtime. ) 90 tablet 3   No current facility-administered medications for this visit.     Allergies:   Nsaids and Doxycycline    Social History:  The patient  reports that she has never smoked. She has never used smokeless tobacco. She reports that she does not drink alcohol or use drugs.   Family History:  The patient's family history includes COPD in her mother; Cancer in her mother; Diabetes in her father and paternal grandfather; Heart disease in her maternal grandmother; Pulmonary embolism in her father.    ROS:  Please see the history of present illness.   Otherwise, review of systems are positive for none.   All other systems are reviewed and negative.    PHYSICAL EXAM: VS:  BP (!) 110/50 (BP Location: Left Arm, Patient Position: Sitting, Cuff Size: Large)   Pulse 64   Ht '5\' 4"'$  (1.626 m)   Wt 216 lb (98 kg)   BMI 37.08 kg/m  , BMI Body mass index is 37.08 kg/m. GEN: Well nourished, well developed, in no acute distress  HEENT: normal  Neck: no JVD, carotid bruits, or masses Cardiac: RRR; no murmurs, rubs, or gallops, trace edema  Respiratory:  clear to auscultation bilaterally, normal work of breathing GI: soft, nontender, nondistended, + BS MS: no deformity or atrophy  Skin: warm and dry, no rash Neuro:  Strength and sensation are intact Psych: euthymic mood, full affect   EKG:  EKG is ordered today. EKG showed normal sinus rhythm with no significant ST or T wave changes.   Recent Labs: 08/19/2017:  TSH 1.80 09/05/2017: BUN 47; Creatinine, Ser 10.14; Hemoglobin 12.1; Platelets 212; Potassium 4.2; Sodium 142 11/07/2017: ALT 17    Lipid Panel    Component Value Date/Time   CHOL 162 11/07/2017 0950   CHOL 172 07/16/2017 1500   TRIG 110 11/07/2017 0950   HDL 48 11/07/2017 0950  HDL 47 07/16/2017 1500   CHOLHDL 3.4 11/07/2017 0950   VLDL 22 11/07/2017 0950   LDLCALC 92 11/07/2017 0950   LDLCALC 91 07/16/2017 1500   LDLDIRECT 126.9 10/31/2012 0801      Wt Readings from Last 3 Encounters:  02/11/18 216 lb (98 kg)  12/26/17 212 lb 6.4 oz (96.3 kg)  12/03/17 205 lb 0.4 oz (93 kg)       ASSESSMENT AND PLAN:  1.  Chronic systolic/diastolic heart failure: No recent evaluation for ejection fraction.  She is off all antihypertensive medications due to low blood pressure during dialysis.  I requested a follow-up echocardiogram.  2. Coronary artery disease involving native coronary arteries without angina: She is overall doing reasonably well.  Continue medical Therapy.  3.  Hypotension associated with dialysis: Improved with daily Midodrin.    4.  Hyperlipidemia: Continue high-dose atorvastatin and Praluent.  I requested a follow-up lipid and liver profile.   Disposition:   FU with me in 6 months  Signed,  Kathlyn Sacramento, MD  02/11/2018 1:44 PM    [place]

## 2018-02-12 DIAGNOSIS — D631 Anemia in chronic kidney disease: Secondary | ICD-10-CM | POA: Diagnosis not present

## 2018-02-12 DIAGNOSIS — E1122 Type 2 diabetes mellitus with diabetic chronic kidney disease: Secondary | ICD-10-CM | POA: Diagnosis not present

## 2018-02-12 DIAGNOSIS — N186 End stage renal disease: Secondary | ICD-10-CM | POA: Diagnosis not present

## 2018-02-12 DIAGNOSIS — N2581 Secondary hyperparathyroidism of renal origin: Secondary | ICD-10-CM | POA: Diagnosis not present

## 2018-02-12 LAB — HEPATIC FUNCTION PANEL
ALT: 13 IU/L (ref 0–32)
AST: 13 IU/L (ref 0–40)
Albumin: 4.2 g/dL (ref 3.6–4.8)
Alkaline Phosphatase: 77 IU/L (ref 39–117)
Bilirubin Total: 0.3 mg/dL (ref 0.0–1.2)
Bilirubin, Direct: 0.15 mg/dL (ref 0.00–0.40)
Total Protein: 6.4 g/dL (ref 6.0–8.5)

## 2018-02-12 LAB — LIPID PANEL
Chol/HDL Ratio: 3.6 ratio (ref 0.0–4.4)
Cholesterol, Total: 153 mg/dL (ref 100–199)
HDL: 43 mg/dL (ref >39)
LDL Calculated: 51 mg/dL (ref 0–99)
Triglycerides: 294 mg/dL — ABNORMAL HIGH (ref 0–149)
VLDL Cholesterol Cal: 59 mg/dL — ABNORMAL HIGH (ref 5–40)

## 2018-02-12 LAB — LDL CHOLESTEROL, DIRECT: LDL Direct: 60 mg/dL (ref 0–99)

## 2018-02-13 ENCOUNTER — Other Ambulatory Visit: Payer: Self-pay

## 2018-02-13 ENCOUNTER — Ambulatory Visit (INDEPENDENT_AMBULATORY_CARE_PROVIDER_SITE_OTHER): Payer: Medicare Other

## 2018-02-13 DIAGNOSIS — I5022 Chronic systolic (congestive) heart failure: Secondary | ICD-10-CM | POA: Diagnosis not present

## 2018-02-13 MED ORDER — PERFLUTREN LIPID MICROSPHERE
1.0000 mL | INTRAVENOUS | Status: AC | PRN
  Administered 2018-02-13: 2 mL via INTRAVENOUS

## 2018-02-14 ENCOUNTER — Telehealth: Payer: Self-pay

## 2018-02-14 DIAGNOSIS — D631 Anemia in chronic kidney disease: Secondary | ICD-10-CM | POA: Diagnosis not present

## 2018-02-14 DIAGNOSIS — E1122 Type 2 diabetes mellitus with diabetic chronic kidney disease: Secondary | ICD-10-CM | POA: Diagnosis not present

## 2018-02-14 DIAGNOSIS — N186 End stage renal disease: Secondary | ICD-10-CM | POA: Diagnosis not present

## 2018-02-14 DIAGNOSIS — N2581 Secondary hyperparathyroidism of renal origin: Secondary | ICD-10-CM | POA: Diagnosis not present

## 2018-02-14 NOTE — Telephone Encounter (Signed)
Sent to PCP ?

## 2018-02-14 NOTE — Telephone Encounter (Signed)
Please contact the patient to see what issues she is having with her gait. Is this new or old? Does she have any specific symptoms with the gait being off?

## 2018-02-14 NOTE — Telephone Encounter (Signed)
Called patient and left a VM to call back.  

## 2018-02-14 NOTE — Telephone Encounter (Signed)
Copied from [place] [id number]. Topic: General - Other >> Feb 14, 2018 11:19 AM Oneta Rack wrote: Relation to pt: spouse Tashawna Thom Call back number: [phone]    Reason for call:  Patient requesting a rolling walker with seat  due to gait being off, please advise

## 2018-02-17 DIAGNOSIS — E1122 Type 2 diabetes mellitus with diabetic chronic kidney disease: Secondary | ICD-10-CM | POA: Diagnosis not present

## 2018-02-17 DIAGNOSIS — D631 Anemia in chronic kidney disease: Secondary | ICD-10-CM | POA: Diagnosis not present

## 2018-02-17 DIAGNOSIS — N186 End stage renal disease: Secondary | ICD-10-CM | POA: Diagnosis not present

## 2018-02-17 DIAGNOSIS — N2581 Secondary hyperparathyroidism of renal origin: Secondary | ICD-10-CM | POA: Diagnosis not present

## 2018-02-18 NOTE — Telephone Encounter (Signed)
Called and spoke with patient.   Issues started: 7-8 month ago Gait issues: Pt is feeling off balance when walking on her own she feels dizzy no other S & S but she did see ENT doctor they did not feel she had vertigo symptoms but did feel she was dealing with a balance issue. Pt did go see the neurologist as well and they stated that she may need PT for her balance issue if insurance will approve it. They felt that he symptoms could be related to her spinal injury due a fall years ago. Neurologist is wanting to do an MRI of pt's spine.

## 2018-02-18 NOTE — Telephone Encounter (Signed)
Noted. Please create a hand written prescription with the patients information on it and place is on my desk to fill in with the appropriate order. Thanks.

## 2018-02-19 DIAGNOSIS — E1122 Type 2 diabetes mellitus with diabetic chronic kidney disease: Secondary | ICD-10-CM | POA: Diagnosis not present

## 2018-02-19 DIAGNOSIS — N186 End stage renal disease: Secondary | ICD-10-CM | POA: Diagnosis not present

## 2018-02-19 DIAGNOSIS — N2581 Secondary hyperparathyroidism of renal origin: Secondary | ICD-10-CM | POA: Diagnosis not present

## 2018-02-19 DIAGNOSIS — D631 Anemia in chronic kidney disease: Secondary | ICD-10-CM | POA: Diagnosis not present

## 2018-02-20 NOTE — Telephone Encounter (Signed)
Done placed for you to fill out & sign.   Thanks  Placed on your desk

## 2018-02-21 DIAGNOSIS — E1122 Type 2 diabetes mellitus with diabetic chronic kidney disease: Secondary | ICD-10-CM | POA: Diagnosis not present

## 2018-02-21 DIAGNOSIS — N2581 Secondary hyperparathyroidism of renal origin: Secondary | ICD-10-CM | POA: Diagnosis not present

## 2018-02-21 DIAGNOSIS — N186 End stage renal disease: Secondary | ICD-10-CM | POA: Diagnosis not present

## 2018-02-21 DIAGNOSIS — D631 Anemia in chronic kidney disease: Secondary | ICD-10-CM | POA: Diagnosis not present

## 2018-02-21 NOTE — Telephone Encounter (Signed)
Called pt and left a VM that Rx has been placed up front for pick up.

## 2018-02-21 NOTE — Telephone Encounter (Signed)
Completed.  Placed on your desk.  Please make available for the patient to pick up.

## 2018-02-23 DIAGNOSIS — E1122 Type 2 diabetes mellitus with diabetic chronic kidney disease: Secondary | ICD-10-CM | POA: Diagnosis not present

## 2018-02-23 DIAGNOSIS — N186 End stage renal disease: Secondary | ICD-10-CM | POA: Diagnosis not present

## 2018-02-23 DIAGNOSIS — D631 Anemia in chronic kidney disease: Secondary | ICD-10-CM | POA: Diagnosis not present

## 2018-02-23 DIAGNOSIS — N2581 Secondary hyperparathyroidism of renal origin: Secondary | ICD-10-CM | POA: Diagnosis not present

## 2018-02-24 DIAGNOSIS — E1122 Type 2 diabetes mellitus with diabetic chronic kidney disease: Secondary | ICD-10-CM | POA: Diagnosis not present

## 2018-02-24 DIAGNOSIS — N186 End stage renal disease: Secondary | ICD-10-CM | POA: Diagnosis not present

## 2018-02-24 DIAGNOSIS — D631 Anemia in chronic kidney disease: Secondary | ICD-10-CM | POA: Diagnosis not present

## 2018-02-24 DIAGNOSIS — N2581 Secondary hyperparathyroidism of renal origin: Secondary | ICD-10-CM | POA: Diagnosis not present

## 2018-02-24 NOTE — Telephone Encounter (Signed)
Called pt again and left a VM that Rx has been placed up front for pick up.

## 2018-02-25 ENCOUNTER — Ambulatory Visit: Payer: Medicare Other

## 2018-02-27 ENCOUNTER — Ambulatory Visit
Admission: RE | Admit: 2018-02-27 | Discharge: 2018-02-27 | Disposition: A | Discharge status: Home / Self Care | Payer: Medicare Other | Source: Ambulatory Visit | Attending: Neurology | Admitting: Neurology

## 2018-02-27 DIAGNOSIS — M4802 Spinal stenosis, cervical region: Secondary | ICD-10-CM | POA: Diagnosis not present

## 2018-02-27 DIAGNOSIS — M542 Cervicalgia: Secondary | ICD-10-CM | POA: Insufficient documentation

## 2018-02-27 DIAGNOSIS — M47812 Spondylosis without myelopathy or radiculopathy, cervical region: Secondary | ICD-10-CM | POA: Insufficient documentation

## 2018-02-28 DIAGNOSIS — N186 End stage renal disease: Secondary | ICD-10-CM | POA: Diagnosis not present

## 2018-02-28 DIAGNOSIS — D631 Anemia in chronic kidney disease: Secondary | ICD-10-CM | POA: Diagnosis not present

## 2018-02-28 DIAGNOSIS — E1122 Type 2 diabetes mellitus with diabetic chronic kidney disease: Secondary | ICD-10-CM | POA: Diagnosis not present

## 2018-02-28 DIAGNOSIS — N2581 Secondary hyperparathyroidism of renal origin: Secondary | ICD-10-CM | POA: Diagnosis not present

## 2018-02-28 NOTE — Telephone Encounter (Signed)
Called patient and left a VM that Rx for rolling walker has been placed up front for pick up.

## 2018-03-02 DIAGNOSIS — E1122 Type 2 diabetes mellitus with diabetic chronic kidney disease: Secondary | ICD-10-CM | POA: Diagnosis not present

## 2018-03-02 DIAGNOSIS — N186 End stage renal disease: Secondary | ICD-10-CM | POA: Diagnosis not present

## 2018-03-02 DIAGNOSIS — D631 Anemia in chronic kidney disease: Secondary | ICD-10-CM | POA: Diagnosis not present

## 2018-03-02 DIAGNOSIS — N2581 Secondary hyperparathyroidism of renal origin: Secondary | ICD-10-CM | POA: Diagnosis not present

## 2018-03-04 DIAGNOSIS — Z992 Dependence on renal dialysis: Secondary | ICD-10-CM | POA: Diagnosis not present

## 2018-03-04 DIAGNOSIS — D631 Anemia in chronic kidney disease: Secondary | ICD-10-CM | POA: Diagnosis not present

## 2018-03-04 DIAGNOSIS — N186 End stage renal disease: Secondary | ICD-10-CM | POA: Diagnosis not present

## 2018-03-04 DIAGNOSIS — N2581 Secondary hyperparathyroidism of renal origin: Secondary | ICD-10-CM | POA: Diagnosis not present

## 2018-03-04 DIAGNOSIS — E1122 Type 2 diabetes mellitus with diabetic chronic kidney disease: Secondary | ICD-10-CM | POA: Diagnosis not present

## 2018-03-04 NOTE — Telephone Encounter (Signed)
Called patient and left a VM to call back. Sent to [facility] has patient already picke dthis up? I have not heard back from them.   CRM created and sent to [facility] pool.

## 2018-03-04 NOTE — Telephone Encounter (Signed)
Pt returned call. Pt says that she has picked up Rx for chair and has already picked up her chair. Pt is good and thanks PCP for checking.

## 2018-03-05 ENCOUNTER — Other Ambulatory Visit: Payer: Self-pay | Admitting: Family Medicine

## 2018-03-06 NOTE — Telephone Encounter (Signed)
Please contact the patient and see if she is taking this.  Please see if this is effective for her neuropathy.  Based on her kidney disease she is on too high of a dose and I would like to consider an alternative medication.

## 2018-03-07 ENCOUNTER — Other Ambulatory Visit: Payer: Self-pay | Admitting: Cardiovascular Disease

## 2018-03-07 ENCOUNTER — Other Ambulatory Visit: Payer: Self-pay | Admitting: Family Medicine

## 2018-03-07 DIAGNOSIS — D631 Anemia in chronic kidney disease: Secondary | ICD-10-CM | POA: Diagnosis not present

## 2018-03-07 DIAGNOSIS — N186 End stage renal disease: Secondary | ICD-10-CM | POA: Diagnosis not present

## 2018-03-07 DIAGNOSIS — N2581 Secondary hyperparathyroidism of renal origin: Secondary | ICD-10-CM | POA: Diagnosis not present

## 2018-03-07 DIAGNOSIS — E1122 Type 2 diabetes mellitus with diabetic chronic kidney disease: Secondary | ICD-10-CM | POA: Diagnosis not present

## 2018-03-07 NOTE — Telephone Encounter (Signed)
Copied from [place] [phone]. Topic: Quick Communication - Rx Refill/Question >> Mar 07, 2018  1:03 PM Burchel, Abbi R wrote: Medication: gabapentin (NEURONTIN) 300 MG capsule  Preferred Pharmacy: [id number] [address], [place]  [phone] (Phone) [phone] (Fax)  Pt was advised that RX refills may take up to 3 business days. We ask that you follow-up with your pharmacy.

## 2018-03-07 NOTE — Telephone Encounter (Signed)
Left message for pt to contact pharmacy for refills. Refills of medication should be on file at the requested pharmacy.

## 2018-03-10 DIAGNOSIS — E1122 Type 2 diabetes mellitus with diabetic chronic kidney disease: Secondary | ICD-10-CM | POA: Diagnosis not present

## 2018-03-10 DIAGNOSIS — N2581 Secondary hyperparathyroidism of renal origin: Secondary | ICD-10-CM | POA: Diagnosis not present

## 2018-03-10 DIAGNOSIS — D631 Anemia in chronic kidney disease: Secondary | ICD-10-CM | POA: Diagnosis not present

## 2018-03-10 DIAGNOSIS — N186 End stage renal disease: Secondary | ICD-10-CM | POA: Diagnosis not present

## 2018-03-11 ENCOUNTER — Telehealth: Payer: Self-pay | Admitting: Cardiovascular Disease

## 2018-03-11 ENCOUNTER — Telehealth: Payer: Self-pay | Admitting: Family Medicine

## 2018-03-11 NOTE — Telephone Encounter (Signed)
Received message from clinical pharmacist. She spoke with [facility] and the patient is getting emailed a coupon card to use at the pharmacy tonight. She will additionally work on patient assistance for the patient. Please follow up with the patient tomorrow to make sure she got the basaglar.

## 2018-03-11 NOTE — Telephone Encounter (Signed)
Called and s/w husband, ok per DPR. Patient's insurance changed from [place] to [place] and no longer covers Praluent. It would cost them around $500 for 2 pens and they cannot afford that. Gave patient number for PASS program (Patient assistance for Praluent), application printed for patient to pick up and one sample will be ready for pickup as well to supplement during the process.  Instructed him to call Praluent and make sure they will qualify and let us know if they will not so we can formulate another plan.   Papers placed for Dr Fletcher Anon to sign before patient comes.    Medication Samples have been provided to the patient.  Drug name: Praluent       Strength: 75mg         Qty: 1 pen  LOT: [id number]  Exp.Date: 01/03/2019

## 2018-03-11 NOTE — Telephone Encounter (Signed)
Pt spouse states that Praluent is too expensive due to his insurance changes. Please call to discuss alternative.

## 2018-03-11 NOTE — Telephone Encounter (Signed)
Sent to PCP ?

## 2018-03-11 NOTE — Telephone Encounter (Signed)
Copied from [place] [id number]. Topic: Quick Communication - See Telephone Encounter >> Mar 11, 2018  1:24 PM Vernona Rieger wrote: CRM for notification. See Telephone encounter for: 03/11/18  Patient states that Insulin Glargine (BASAGLAR KWIKPEN) 100 UNIT/ML SOPN is too expensive since she is on a new insurance. She is on [employer]. She is no longer with Aetna. It will be about $400 for this. She is completely out and is asking could something else be called in place of this or did Dr Caryl Bis have any samples? Please contact patient @ [phone]. She has to use [facility] on [address] now [address].

## 2018-03-11 NOTE — Telephone Encounter (Signed)
Please confirm the dose of basaglar. Please check to see if we have lantus or basaglar samples. I will forward to Catie to see if she can check the patients formulary to see what is preferred and likely to be affordable. Thanks.

## 2018-03-11 NOTE — Telephone Encounter (Signed)
Received call back from patient. She noted that she has been taking Basaglar 28 units once daily, and took her last full dose last night. She did not realize that her insurance was going to change, and was unaware of the deductible requirements.   She notes that she changed from [place] to [place]. She was informed that she needed to switch from [facility] to [place].   With Ms. Slates, we called [facility]. They will provide 1 box of insulin (or 30 day supply, whichever is less) to a patient one time in their lifetime. The representative was going to email a coupon card to Ms. Mckesson, and she was going to take this to the pharmacy to receive this Basaglar supply for free.   I will print and fax [employer] application to [employer]. Patient will come by later this week and sign the patient portion, and have copies made of her and her husband's social security statements. I will collect all of this on Monday when I am in clinic.   I will call the patient tomorrow to ensure she received Basaglar.   Catie Darnelle Maffucci, PharmD PGY2 Ambulatory Care Pharmacy Resident, [place] [facility] Phone: [phone]

## 2018-03-11 NOTE — Telephone Encounter (Signed)
LMTCB

## 2018-03-11 NOTE — Telephone Encounter (Signed)
Pt states she has changed pharmacies. Advised pt to call new pharmacy and have the refills transferred. New pharmacy has been updated in chart.

## 2018-03-11 NOTE — Telephone Encounter (Signed)
I contacted [place]; was informed that Nancee Liter was $56 for a 42 day supply.   However, Praluent copay for a 30 day supply was $693; she has a $435 deductible and $427 of the Praluent cost is going towards the deductible. Appears her Praluent copay may be ~$200 per month moving forward.   I would be happy to work with the patient for patient assistance. It appears per Orson Gear 08/19/2017 note that the patient may used to have had Medicare Extra Help/Low Income Subsidy at that point. We can discuss the financial criteria for this program, as well as financial criteria for Basaglar and Praluent patient assistance.   Contacted patient, left HIPAA compliant message for her to call me at her convenience.   Catie Darnelle Maffucci, PharmD PGY2 Ambulatory Care Pharmacy Resident, [place] [facility] Phone: [phone]

## 2018-03-11 NOTE — Telephone Encounter (Signed)
Please see message below. Please also see when she ran out of her insulin.

## 2018-03-12 ENCOUNTER — Other Ambulatory Visit: Payer: Self-pay | Admitting: Pharmacist

## 2018-03-12 DIAGNOSIS — N2581 Secondary hyperparathyroidism of renal origin: Secondary | ICD-10-CM | POA: Diagnosis not present

## 2018-03-12 DIAGNOSIS — N186 End stage renal disease: Secondary | ICD-10-CM | POA: Diagnosis not present

## 2018-03-12 DIAGNOSIS — E1122 Type 2 diabetes mellitus with diabetic chronic kidney disease: Secondary | ICD-10-CM | POA: Diagnosis not present

## 2018-03-12 DIAGNOSIS — D631 Anemia in chronic kidney disease: Secondary | ICD-10-CM | POA: Diagnosis not present

## 2018-03-12 NOTE — Telephone Encounter (Addendum)
Faxed pages requiring patient signature for Basaglar and Praluent to clinic. Patient plans to come by before the end of the week. We will also need 1) copy of the patient and her husband's social security statements and 2) copy of her new prescription insurance card [facility]).   See telephone note dated 01/11/2019 for patient outreach information.   Catie Darnelle Maffucci, PharmD PGY2 Ambulatory Care Pharmacy Resident, [place] [facility] Phone: [phone]

## 2018-03-12 NOTE — Telephone Encounter (Signed)
Patient husband picked up praluent samples and dropped off copies of ss statement . Placed in nurse box.

## 2018-03-12 NOTE — Patient Outreach (Signed)
[facility]) Care Management  [place]   03/12/2018  Jamie Arias 08/14/1955 [id number]  Reason for referral: Medication Assistance with Basaglar  Referral source: Dr. Caryl Bis Current insurance:Traditional Medicare A/B + [facility] prescription insurance plan  PMHx includes but not limited to: T2DM, CHF, ESRD on dialysis (since 2017), CAD (hx NSTEMI, CABG 2015)  Subjective: - Received message from PCP Dr. Caryl Bis yesterday, 03/11/2018, that patient was out of Basaglar and unable to afford the copay + deductible. She previously had an A[employer]na part D plan, but changed to a Well[employer] Part D plan in 2020, and was unaware of the deductible requirements. See phone note for Dr. Caryl Bis dated 03/11/2018. In summary, patient has a ~$420 deductible for 2020 and she is unable to afford this to receive Basaglar and Praluent. She notes that she was told that her medications would be cheaper at [facility] instead of [facility], so she transferred her prescriptions there.  Wilburn Mylar, Jamie Arias and I called [facility], and they were to email her a coupon card to get a free month of this medication.    Medication Assistance Findings:  Medication assistance needs identified.   Extra Help:   []  Already receiving Full Extra Help  []  Already receiving Partial Extra Help  []  Eligible based on reported income and assets  [x]  Not Eligible based on reported income and assets  Patient Assistance Programs: - Engineer, agricultural through [employer]; Dr. Caryl Bis - Praluent through PASS program; Dr. Fletcher Anon  Assessment:  - Working with Susy Frizzle, CPhT to complete Basaglar and Praluent patient assistance forms. I will work with Dr. Caryl Bis to complete Basaglar form; will fax Praluent clinical information portions of the form to Dr. Tyrell Antonio office.  - Uncertain which [place] Part D plan patient has, as there are multiple. Once I have that information, I will work  with her to review her formulary/benefits   Plan: - Contacted patient, left HIPAA compliant message for patient to return my call at her convenience.   Catie Darnelle Maffucci, PharmD PGY2 Ambulatory Care Pharmacy Resident, [place] [facility] Phone: [phone]

## 2018-03-12 NOTE — Telephone Encounter (Signed)
Called patient's husband, ok per DPR. Let him know I have patient's portion of application for them to fill out. Advised I could mail it to him. He would like that.  Dr Tyrell Antonio portion of application is on his desk for signature. Will attach patient's SS forms to that form.

## 2018-03-13 ENCOUNTER — Other Ambulatory Visit: Payer: Self-pay | Admitting: Pharmacist

## 2018-03-13 NOTE — Patient Outreach (Signed)
[place] [facility]) Care Management  03/13/2018  AURIEL KIST 1955-09-14 [id number]  Contacted patient, HIPAA verifiers identified. She noted that she was able to pick up 1 month supply of Basaglar for free via the [employer] coupon card she received on Tuesday.   She was dropping off 1) copies of social security statements 2) copy of Medicare + [employer] part D insurance cards and 3) signing patient assistance applications at the clinic. I will pick these up on Monday when I'm in clinic.  She expressed that she is very overwhelmed with her medical care at this point. She previously had Medicaid coverage, but lost this last year. She notes that she had a representative at the department of social services who was supposed to be helping her reapply for Medicaid, but this person is not returning her calls. She is also very concerned for her grandson, who was just deployed to the middle [place]. I offered a call from a [place] telephonic nurse to see if she there would be nursing/social work resources available for her, she accepted.   Scheduled follow up phone call on Tuesday for a full medication and formulary review.   Plan - Will route message to PCP Dr. Caryl Bis for notification - Will place referral for [place], PharmD PGY2 Ambulatory Care Pharmacy Resident, [place] Phone: [phone]

## 2018-03-14 DIAGNOSIS — N186 End stage renal disease: Secondary | ICD-10-CM | POA: Diagnosis not present

## 2018-03-14 DIAGNOSIS — E1122 Type 2 diabetes mellitus with diabetic chronic kidney disease: Secondary | ICD-10-CM | POA: Diagnosis not present

## 2018-03-14 DIAGNOSIS — N2581 Secondary hyperparathyroidism of renal origin: Secondary | ICD-10-CM | POA: Diagnosis not present

## 2018-03-14 DIAGNOSIS — D631 Anemia in chronic kidney disease: Secondary | ICD-10-CM | POA: Diagnosis not present

## 2018-03-17 DIAGNOSIS — N2581 Secondary hyperparathyroidism of renal origin: Secondary | ICD-10-CM | POA: Diagnosis not present

## 2018-03-17 DIAGNOSIS — E1122 Type 2 diabetes mellitus with diabetic chronic kidney disease: Secondary | ICD-10-CM | POA: Diagnosis not present

## 2018-03-17 DIAGNOSIS — D631 Anemia in chronic kidney disease: Secondary | ICD-10-CM | POA: Diagnosis not present

## 2018-03-17 DIAGNOSIS — N186 End stage renal disease: Secondary | ICD-10-CM | POA: Diagnosis not present

## 2018-03-18 ENCOUNTER — Encounter: Payer: Self-pay | Admitting: *Deleted

## 2018-03-18 ENCOUNTER — Other Ambulatory Visit: Payer: Self-pay | Admitting: Family Medicine

## 2018-03-18 ENCOUNTER — Other Ambulatory Visit: Payer: Self-pay | Admitting: Pharmacy Technician

## 2018-03-18 ENCOUNTER — Other Ambulatory Visit: Payer: Self-pay | Admitting: Pharmacist

## 2018-03-18 ENCOUNTER — Other Ambulatory Visit: Payer: Self-pay | Admitting: *Deleted

## 2018-03-18 MED ORDER — LEVOTHYROXINE SODIUM 75 MCG PO TABS: ORAL_TABLET | ORAL | 0 refills | Status: DC

## 2018-03-18 NOTE — Telephone Encounter (Signed)
Requested Prescriptions  Pending Prescriptions Disp Refills  . levothyroxine (SYNTHROID, LEVOTHROID) 75 MCG tablet 90 tablet 0     Endocrinology:  Hypothyroid Agents Failed - 03/18/2018 10:19 AM      Failed - TSH needs to be rechecked within 3 months after an abnormal result. Refill until TSH is due.      Passed - TSH in normal range and within 360 days    TSH  Date Value Ref Range Status  08/19/2017 1.80 0.35 - 4.50 uIU/mL Final         Passed - Valid encounter within last 12 months    Recent Outpatient Visits          6 months ago Hypotension, unspecified hypotension type   [facility], Angela Adam, MD   6 months ago ESRD on dialysis [facility])   [facility] [name], MD   7 months ago Type 2 diabetes mellitus with diabetic neuropathy, without long-term current use of insulin [facility])   [place], [place], [place]   7 months ago Chronic neck pain   [place] [facility], Angela Adam, MD   8 months ago Type 2 diabetes mellitus with diabetic neuropathy, with long-term current use of insulin [facility])   [place], Johny [place], [facility]      Future Appointments            In 1 week Caryl Bis, Angela Adam, MD [facility]

## 2018-03-18 NOTE — Patient Outreach (Addendum)
[facility] [facility]) Care Management  [place]   03/18/2018  Jamie Arias 06-29-1955 [id number]  Reason for referral: Medication Assistance with Insulin  Referral source: Dr. Caryl Bis Current insurance:[employer] [facility] Medicare Rx Saver  PMHx includes but not limited to:  T2DM, CHF, ESRD on dialysis (since 2017), CAD (hx NSTEMI, CABG 2015)  Outreach:  Successful telephone call with patient.  HIPAA identifiers verified.   Subjective:  Spoke with Jamie Arias today for a full medication reconciliation and review. She notes that she is feeling a bit better today, as she was able to speak with her grandson who is overseas. She appreciated the call from [place] today, and is looking forward to speaking to her later this month.   Jamie Arias notes that her husband helps her manage her medications.   She notes that she typically checks blood sugars first thing in the morning, and has had elevated readings for the past few months, with readings typically 250-300. She states that she has been much more stressed lately with her health and family. Reports that her last A1c checked in dialysis was "in the 9s". She endorses adherence to Basaglar 28 units daily, and notes that she has been on this dose since last seeing the clinical pharmacist in 08/2017. Denies episodes of hypoglycemia. Endorses increased peripheral neuropathy in her feet.  She notes that her husband was able to pick up Praluent samples from Dr. Tyrell Antonio office. They have completed the patient assistance application for Praluent, and plan to drop it back off at Dr. Tyrell Antonio office later this week.    Objective: Lab Results  Component Value Date   CREATININE 10.14 (H) 09/05/2017   CREATININE 15.96 (H) 09/04/2017   CREATININE 15.22 (H) 09/04/2017    Lab Results  Component Value Date   HGBA1C 7.1 08/16/2017    Lipid Panel     Component Value Date/Time   CHOL 153 02/11/2018 1404   TRIG 294  (H) 02/11/2018 1404   HDL 43 02/11/2018 1404   CHOLHDL 3.6 02/11/2018 1404   CHOLHDL 3.4 11/07/2017 0950   VLDL 22 11/07/2017 0950   LDLCALC 51 02/11/2018 1404   LDLDIRECT 60 02/11/2018 1404   LDLDIRECT 126.9 10/31/2012 0801    BP Readings from Last 3 Encounters:  02/11/18 (!) 110/50  12/26/17 (!) 144/73  12/03/17 (!) 136/49    Allergies  Allergen Reactions  . Nsaids Other (See Comments)    Contraindicated due to kidney disease.  Marland Kitchen Doxycycline Other (See Comments)    tremor    Medications Reviewed Today    Reviewed by De Hollingshead, [facility] (Pharmacist) on 03/18/18 at 1619  Med List Status: <None>  Medication Order Taking? Sig Documenting Provider Last Dose Status Informant  ACCU-CHEK FASTCLIX LANCETS MISC [id number] Yes Check blood glucose twice daily, diagnosis code E11.9 [name], MD Taking Active Pharmacy Records  acetaminophen (TYLENOL) 325 MG tablet [id number] No Take 650 mg by mouth daily as needed for moderate pain or headache.  [provider] Not Taking Active Pharmacy Records  Alirocumab (PRALUENT) 75 MG/ML SOPN [id number] Yes Inject 75 mg into the skin every 14 (fourteen) days. Theora Gianotti, NP Taking Active Self  aspirin EC 81 MG tablet [id number] Yes Take 81 mg by mouth daily. [provider] Taking Active Pharmacy Records  atorvastatin (LIPITOR) 80 MG tablet [id number] Yes TAKE 1 TABLET(80 MG) BY MOUTH DAILY Gollan, Kathlene November, MD Taking Active   b complex-vitamin c-folic acid (  NEPHRO-VITE) 0.8 MG TABS tablet [id number] Yes Take 1 tablet by mouth at bedtime. [provider] Taking Active Pharmacy Records           Med Note Burt Knack, STACI   Tue Jul 16, 2017  2:30 PM)    blood glucose meter kit and supplies KIT [id number] Yes Dispense based on patient and insurance preference. Use up to twice daily. (FOR ICD-10 E11.9) Doss, Velora Heckler, RN Taking Active Pharmacy Records  Blood Glucose Monitoring Suppl (ACCU-CHEK AVIVA PLUS)  w/Device KIT [id number] Yes Use as directed to test blood sugar up to three times daily [name], MD Taking Active Pharmacy Records  Calcium Acetate 668 (169 Ca) MG TABS [id number] Yes TAKE 3 TABLETS BY MOUTH THREE TIMES DAILY WITH MEALS [provider] Taking Active            Med Note ([place]   Tue Mar 18, 2018  4:15 PM) Taking 1 tablet three times daily   ergocalciferol (VITAMIN D2) 50000 units capsule [id number] Yes Take 50,000 Units by mouth once a week.  [provider] Taking Active Pharmacy Records           Med Note Burt Knack, STACI   Tue Jul 16, 2017  2:35 PM)    ferrous sulfate 325 (65 FE) MG tablet [id number] Yes Take 325 mg by mouth daily with breakfast. [provider] Taking Active Pharmacy Records  fluticasone [facility]) 50 MCG/ACT nasal spray [id number] Yes Place 2 sprays into both nostrils daily. [name], MD Taking Active            Med Note ([place]   Tue Mar 18, 2018  4:16 PM) PRN  furosemide (LASIX) 20 MG tablet [id number] Yes Take 20 mg by mouth every other day. Non-dialysis days [provider] Taking Active Pharmacy Records           Med Note Joaquim Lai   Tue Jul 16, 2017  2:35 PM)    gabapentin (NEURONTIN) 300 MG capsule [id number] Yes TAKE 1 CAPSULE(300 MG) BY MOUTH THREE TIMES DAILY [name], MD Taking Active            Med Note Darnelle Maffucci, Arville Lime   Tue Mar 18, 2018  4:16 PM) Taking 3 together QHS  glucose blood (ACCU-CHEK AVIVA) test strip [id number]  Use as instructed to test blood sugar up to 3 times daily [name], MD  Active Pharmacy Records  Insulin Glargine [facility] [facility]) 100 UNIT/ML SOPN [id number] Yes Inject 0.25-0.35 mLs (25-35 Units total) into the skin daily.  Patient taking differently:  Inject 28 Units into the skin daily.    [name], MD Taking Active Pharmacy Records  Insulin Pen Needle (GLOBAL EASE INJECT PEN NEEDLES) 31G X 5 MM MISC [id number]   Inject 0.1 mLs (10 Units total) into the skin at bedtime [name], MD  Active Pharmacy Records  Lancets (ACCU-CHEK SOFT [place]) lancets [id number]  Use as instructed [name], MD  Active Pharmacy Records  Lancets Misc. (ACCU-CHEK SOFTCLIX LANCET DEV) KIT [id number]  Use as directed to test blood sugar up to three times daily [name], MD  Active Pharmacy Records  levothyroxine (SYNTHROID, LEVOTHROID) 75 MCG tablet [id number] Yes TAKE 1 TABLET(75 MCG) BY MOUTH DAILY [name], MD Taking Active   lidocaine-prilocaine (EMLA) cream [id number] Yes Apply 1 application topically as needed (port access). [provider] Taking Active Pharmacy Records  midodrine (PROAMATINE) 10 MG  tablet [id number]  Take 10 mg by mouth 2 (two) times daily. Only on dialysis days. [provider]  Active   midodrine (PROAMATINE) 5 MG tablet [id number]  Take 1 tablet (5 mg total) by mouth 2 (two) times daily with a meal. Theora Gianotti, NP  Active            Med Note Darnelle Maffucci, Jamie Ensey E   Tue Mar 18, 2018  4:18 PM) Off dialysis  nitroGLYCERIN (NITROSTAT) 0.4 MG SL tablet [id number] No DISSOLVE ONE TABLET UNDER THE TONGUE EVERY 5 MINUTES AS NEEDED FOR CHEST PAIN.  DO NOT EXCEED A TOTAL OF 3 DOSES IN 15 MINUTES  Patient not taking:  Reported on 03/18/2018   Wellington [place], MD Not Taking Active Self  omeprazole (PRILOSEC) 20 MG capsule [id number] Yes Take 20 mg by mouth 2 (two) times daily before a meal. [provider] Taking Active Self  ondansetron (ZOFRAN-ODT) 4 MG disintegrating tablet [id number] No Take 4 mg by mouth every 8 (eight) hours as needed for nausea or vomiting.  [provider] Not Taking Active Pharmacy Records  traZODone (DESYREL) 50 MG tablet [id number] Yes TAKE 1 TABLET BY MOUTH EVERY NIGHT AT BEDTIME  Patient taking differently:  Take 50 mg by mouth at bedtime.    [name], MD Taking Active Pharmacy Records           Assessment:  Drugs sorted by system:  Neurologic/Psychologic: trazodone   Cardiovascular: Prauluent, aspirin, atorvastatin, furosemide, midodrine  Pulmonary/Allergy: fluticasone intranasal,   Gastrointestinal: omeprazole, ondansetron  Endocrine: Basaglar (insulin glargine), levothyroxine   Renal: calcium acetate  Topical: Emla cream  Pain: acetaminophen, gabapentin,   Vitamins/Minerals/Supplements: Nephro-vite, Vitamin D, ferrous sulfate    Medication Review Findings:  - Gabapentin in ESRD: recommended to dose at 150 mg daily + a supplemental dose of 125-350 mg after dialysis on dialysis days. Continue to monitor patient for drowsiness/dizziness and need to dose-reduce gabapentin.  - Uncontrolled diabetes: insulin titration is warranted to better control fasting blood sugars consistently >250. Will message PCP Dr. Caryl Bis to see if he wants to make any changes now, or wait until scheduled follow up later this month   Medication Assistance Findings:  - Dr. Tyrell Antonio office is facilitating application for patient assistance for Praluent, and Basaglar patient assistance through [place] was submitted today by Jamie Arias, CPhT - Reviewed patient's Part D plan, formulary, and copays for medications. She has a $435 deductible for all tiers of medications.  Preferred mail order pharmacy is [facility], and would have the most cost-effective copays (Tier 1: $0, Tier 2: $2/30 day, $5/90 day; Tier 3 $28/30 day, $70/90 day). Patient transferred prescriptions to [facility] which is a preferred retail pharmacy; copay prices are Tier 1: $0, Tier 2: $2/30, $6/90, Tier 3: $28/30, $84/90).  - Calcium acetate and midodrine are both Tier 3 medications on her plan. A Tier Exception form could be completed for these medications. Will outreach nephrology (calcium acetate) and cardiology (midodrine) to complete these  Plan:  - Will outreach Dr. Caryl Bis regarding blood sugar elevations.  - Will  outreach Dr. Manuella Ghazi, nephrology and Dr. Fletcher Anon, cardiology regarding Tier Exception forms for calcium acetate and midodrine.   Catie Darnelle Maffucci, PharmD PGY2 Ambulatory Care Pharmacy Resident, [place] [facility] Phone: [phone]

## 2018-03-18 NOTE — Telephone Encounter (Signed)
The patient's husband stated that he will drop off the patient's portion tomorrow.

## 2018-03-18 NOTE — Patient Outreach (Signed)
[name]) Care Management  03/18/2018  Jamie Arias Nov 22, 1955 [id number]   Received all necessary signatures and documents from both patient and provider for [facility] application for [place].  Submitted completed application to [employer] via fax.  Will followup with [employer] in 7-10 business days to check on status of application.  Jamie Arias P. [name], [place] Management [phone]

## 2018-03-18 NOTE — Telephone Encounter (Signed)
Copied from [place] [id number]. Topic: Quick Communication - Rx Refill/Question >> Mar 18, 2018  9:53 AM Reyne Dumas L wrote: Medication: levothyroxine (SYNTHROID, LEVOTHROID) 75 MCG tablet  Has the patient contacted their pharmacy? Yes - states that insurance states that they must have a new script to fill for pt (Agent: If no, request that the patient contact the pharmacy for the refill.) (Agent: If yes, when and what did the pharmacy advise?)  Preferred Pharmacy (with phone number or street name): [facility] [address] [place] [phone] (Phone) [phone] (Fax)  Agent: Please be advised that RX refills may take up to 3 business days. We ask that you follow-up with your pharmacy.

## 2018-03-18 NOTE — Patient Outreach (Signed)
[place] [facility]) Care Management [place] Telephone Outreach  03/18/2018  [name] May 13, 1955 [id number]  Successful telephone outreach attempt to Jamie Arias, 63 y/o female referred to [place] by [facility] Pharmacist for patient concerns aorund managing health care in setting of multiple chronic conditions, as well as need for assistance with determination of medicaid eligibility.  Patient has had no recent ED or hospital visits.  Patient has history including, but not limited to HTN/ HLD; DM-Type II; CAD with NSTEMI (2014); sCHF; CKD/ ESRD on HD M-W-Fri; anemia; depression; and GERD.  HIPAA/ identity verified with patient during phone call today, and [facility] CM services/ role were discussed with patient.  Patient provides verbal consent for [place] services as well as verbal permission to speak with her husband [name] "at any time;" husband participates in today's phone call with patient while phone on speaker mode.  Pleasant 70 minute phone call.  Today, patient reports that she "is doing the best (she) can."  Patient reports chronic upper back/ neck pain at "6/10" and she denies new/ recent falls over last year.  Patient confirms that she regularly attends hemodialysis (HD) on M-W-Fri from 10:00 am to 2:30 pm; confirms that she "never misses" established HD sessions.  Patient sounds to be in no obvious distress throughout phone call today.  Patient further reports:  Medications: -- Has all medicationsand takes as prescribed;denies questions about current medications; confirms that she is currently working with [place] team around medication assistance needs; states that the insurance company she used to have for medications "suddenly changed" to another company, and now she is having a "difficult time affording all medications." Patient further reports that she is unable to check her blood sugars more than once daily due to being unable to afford  lancets, stating that insurance will only cover "once daily" blood sugar checks.  I confirmed that patient has contact information for [place] team, and encouraged her to maintain contact with them, which she agrees to do. -- Verbalizes good general understanding of the purpose, dosing, and scheduling of medications.   -- husband assist patient in managing medications by preparing medications for patient daily; patient is able to take independently once medications are prepared -- denies issues with swallowing medications  Provider appointments: -- All recent and upcoming provider appointments were reviewed with patient today- patient verbalizes plans to attend upcoming scheduled PCP appointment in 2 weeks; states husband will provide transportation  Safety/ Mobility/ Falls: -- denies new/ recent falls -- assistive devices: has and uses walker "as needed;" states that due to ongoing HD sessions, she often becomes weak and "stays tired;" does not use walker regularly, but does occasionally use -- general fall risks/ prevention education discussed with patient today  Holiday representative needs: -- currently denies community resource needs, stating supportive family members that assist with care needs as indicated; husband is primary caregiver and they have 4 adult children who live locally and assist in care needs as indicated -- husband provides transportation for patient to all provider appointments, errands, etc, and also assists patient regularly on an as needed basis with all ADL's and I-ADL's -- patient reports frustration around communicating with medicaid case worker: states that she "used to have" Medicaid and that "it was stopped."  She contacted her case worker, Cheri Kearns, [phone]) who said she would assist patient in getting medicaid re-instated; per patient report, this case worker told patient she needed to submit $13,000.00 in  bills, which patient reported that she  did do, several months ago.  Patient reports that the last time she spoke with the case worker was "about a month or 6 weeks ago," and states that she has attempted to contact case worker for an update, but never receives call-back after leaving case worker multiple voice messages requesting same.  Patient stated that she is confused about the process and upset that the case worker is not returning her calls, as her voice mail states she will return all voice messages within 48 hours.  I provided the patient with the number for CMS and encouraged her to call; I also discussed placing a [facility] CSW referral for assistance in navigating the medicaid application/ reinstatement process; patient agrees with [facility] CSW and states she "needs all the help she can get," as she has had "no success" in getting the medicaid caseworker to provide an update.     Advanced Directive (AD) Planning:   --reports does not currently have exisisting AD in place, and today she declines desire for additional information around AD planning.    Self-health management of multiple chronic disease states including CHF; CKD-ESRD; and DM: -- endorses adherence to all HD sessions M-W-F; states these "usually make her very tired," but endorses attending as scheduled -- reports CHF "stable," stating that she has "had no real problems" with CHF "in a long time;" denies monitoring and recording daily weights at home, stating that she keeps up with weights loosely at weekly HD sessions -- attempts to follow renal and diabetic/ low salt diet; however, she reports this is "difficult to do and expensive" states that she finds herself eating more than she should due to the stress of her lifestyle and worry about the unresolved Medicaid issue -- monitors and records fasting blood sugars daily, states that they used to "run in the 100's," but are now normally "running in the 200's" due to her stress and overeating/ not eating right lately; states she only  monitors blood sugars "once daily" as she is unable to afford test strips to monitor more; states to the costs of her medications, may have to give up monitoring blood sugars "all together."  -- reports last A1-C as "probably around 10," however, when I reviewed in EMR, I discovered that the last recorded A1-C was 7.1 (June 2019); patient verbalizes a good general understanding of the significance/ value of A1-C levels, and verbalizes that she expects it to be re-drawn at time of PCP appointment at end of this month.  Patient denies further issues, concerns, or problems today.  I provided/ confirmed that patient has my direct phone number, the main [facility] CM office phone number, and the [facility] CM 24-hour nurse advice phone number should issues arise prior to next scheduled [place] outreach.  Encouraged patient to contact me directly if needs, questions, issues, or concerns arise prior to next scheduled outreach; patient agreed to do so.   Plan:  Patient will take medications as prescribed and will attend all scheduled provider appointments  Patient will promptly notify care providers for any new concerns/ issues/ problems that arise  Patient will continue monitoring/ recording daily fasting blood sugars  Patient will actively communicate with [place] and [place] teams around her stated need for assistance with medications and medicaid follow up   I will make patient's PCP aware of [place] RN CM involvement in patient's care-- will send barriers letter, along with initial assessment, completed today  [place]  outreach to continue with scheduled phone call in 2 weeks, post scheduled PCP appointment  [facility] CM Care Plan Problem One     Most Recent Value  Care Plan Problem One  Medication concerns related to patient inability to afford prescribed medications, as evidenced by patient reporting   Role Documenting the Problem One  Care Management [place] for Problem One   Active  [facility]N Long Term Goal   Over the next 31 days, patient will continue working with [place] team for patient assistance in affording her medications, as evidenced by patient reporting and collaboration with [facility] pharmacy team, as indicated during [place] outreach  [place] Term Goal Start Date  03/18/18  Interventions for Problem One Long Term Goal  Discussed with patient her current concerns around affording medications,  confirmed that she has contact information for [place] team,  encouraged patient to maintain ongoing contact with [place] team    [facility] CM Care Plan Problem Two     Most Recent Value  Care Plan Problem Two  Patient uncertainty around status of Medicaid application, as evidenced by patient reporting  Role Documenting the Problem Two  Care Management Coordinator  Care Plan for Problem Two  Active  Interventions for Problem Two Long Term Goal   Discussed with patient her concerns around her attempts to follow up with medicaid case worker and placed [facility] CSW referral,  provided patient with resource of/ contact information to CMS,  encouraged patient to promptly engage with [facility] CSW once outreach is established  [facility] Long Term Goal  Over the next 31 days, patient will communicate with [facility] CSW to have her concerns around status of medicaid addressed, as evidenced by patient reporting and collaboration with [facility] CSW as indicated during [place] outreach  [place] Term Goal Start Date  03/18/18    [facility] CM Care Plan Problem Three     Most Recent Value  Care Plan Problem Three  Self-health management of chronic disease state of DM in setting of concurrent CKD/ ESRD, as evidenced by patient reporting   Role Documenting the Problem Three  Care Management Coordinator  Care Plan for Problem Three  Active  [facility]N Long Term Goal   Over the next 45 days, patient will verbalize 3 strategies to decrease blood sugars, as evidenced by patient reporting during [place] RN CM outreach   [facility] Long Term Goal Start Date  03/18/18  Interventions for Problem Three Long Term Goal  Discussed with patient her current understanding of her recent A1-C values,  discussed her current daily routine for managing diabetes and her challenges following a diet appropriate for both DM and CKD  [facility] CM Short Term Goal #1   Over the next 14 days, patient will continue monitoring and recording fasting blood sugars, as evidenced by review of same/ patient reporting during [place] CCM outreach  [facility] CM Short Term Goal #1 Start Date  03/18/18  Interventions for Short Term Goal #1  Discussed with patient recent fasting blood sugars at home,  discussed how A1-C values reflect blood sugars over time,  encouraged patient to monitor and record daily blood sugars for review at time of next [place] outreach  [facility] CM Short Term Goal #2   Over the next 30 days, patient will attend all scheduled provider and HD appointments, as evidenced by patient reporting/ provider collaboration as indicated during [place] outreach  [facility] CM Short Term Goal #  2 Start Date  03/18/18  Interventions for Short Term Goal #2  Discussed and reviewed with patient recent and upcoming scheduled provider office visits and confirmed that she has transportation to all,  confirmed that patient attends HD as established     I appreciate the opportunity to participate in Juanelle's care,   Oneta Rack, RN, BSN, [employer] Coordinator [facility] Care Management  [phone]

## 2018-03-19 ENCOUNTER — Telehealth: Payer: Self-pay | Admitting: Cardiovascular Disease

## 2018-03-19 ENCOUNTER — Telehealth: Payer: Self-pay | Admitting: *Deleted

## 2018-03-19 ENCOUNTER — Other Ambulatory Visit: Payer: Self-pay | Admitting: *Deleted

## 2018-03-19 ENCOUNTER — Other Ambulatory Visit: Payer: Self-pay | Admitting: Pharmacist

## 2018-03-19 DIAGNOSIS — D631 Anemia in chronic kidney disease: Secondary | ICD-10-CM | POA: Diagnosis not present

## 2018-03-19 DIAGNOSIS — E1122 Type 2 diabetes mellitus with diabetic chronic kidney disease: Secondary | ICD-10-CM | POA: Diagnosis not present

## 2018-03-19 DIAGNOSIS — N186 End stage renal disease: Secondary | ICD-10-CM | POA: Diagnosis not present

## 2018-03-19 DIAGNOSIS — N2581 Secondary hyperparathyroidism of renal origin: Secondary | ICD-10-CM | POA: Diagnosis not present

## 2018-03-19 NOTE — Telephone Encounter (Signed)
I spoke with pt to let her know that she is needing Prior Authorization for Praluent 75 mg/ml.  I am unable to process PA due to Pt not being currently enrolled in [facility] Part D.  She or her husband will contact office to verify insurance information.

## 2018-03-19 NOTE — Telephone Encounter (Signed)
Jamie Arias came by office Dropped off Patient Assistance forms

## 2018-03-19 NOTE — Telephone Encounter (Signed)
[facility] Rx plan  Member [id number] JGG:[id number] UTM:[id number] RxGroup:[id number]  New PA has been submitted through Covermymeds. Awaiting approval  Sent to Plan today  Next Steps  The plan will fax you a determination, typically within 1 to 5 business days. How do I follow up?  DrugPraluent 75MG /ML auto-injectors  FormWellCare Medicare Coverage Determination Request FormMedicare Coverage Determination Form for Prescription [id number]) [phone]

## 2018-03-19 NOTE — Telephone Encounter (Signed)
Patient pharmacy calling Would like to know if we would be able to complete assistance forms for Midodrine medication Will be faxing form - please call if unable to complete

## 2018-03-19 NOTE — Patient Outreach (Addendum)
[facility]) Care Management  03/19/2018  SKILAR MARCOU March 31, 1955 [id number]  Midodrine and calcium acetate are both Tier 3 on the patient's formulary, and will be a $28/30 day supply or $70/90 day supply through mail order pharmacy, $84/90 day supply at retail pharmacy.   Contacted [facility], left message asking if Tier Exception Request form could be completed for calcium acetate for the patient.   [place], left message asking if Tier Exception Request form could be completed for midodrine for the patient.   Faxed Tier Exception Request form to both offices.   Catie Darnelle Maffucci, PharmD PGY2 Ambulatory Care Pharmacy Resident, [place] [facility] Phone: [phone]

## 2018-03-19 NOTE — Patient Outreach (Signed)
[place] [facility]) Care Management  03/19/2018  Jamie Arias 1956-02-13 [id number]  Initial contact attempt Patient referred to this social worker to discuss issues/concerns regarding her Medicaid application and other financial concerns. Voicemail message left for a return call.    Plan: This Education officer, museum will send patient an Economist. This social worker to reach out to patient again within 3-4 business days.   Sheralyn Boatman [facility] Care Management [phone]

## 2018-03-19 NOTE — Telephone Encounter (Signed)
Form received. I left a message for the patient to call at her home # to clarify the dose of midodrine that she is taking. There are currently 2 different doses on her chart. I asked that she call back to clarify.  Nira Conn has the tier exception form.

## 2018-03-20 NOTE — Telephone Encounter (Signed)
Patient spouse confirmed Midodrine dose   10 mg po BID  M- W - F Dialysis days   5 mg po BID T-TH-Sat-Sun

## 2018-03-21 DIAGNOSIS — N186 End stage renal disease: Secondary | ICD-10-CM | POA: Diagnosis not present

## 2018-03-21 DIAGNOSIS — D631 Anemia in chronic kidney disease: Secondary | ICD-10-CM | POA: Diagnosis not present

## 2018-03-21 DIAGNOSIS — N2581 Secondary hyperparathyroidism of renal origin: Secondary | ICD-10-CM | POA: Diagnosis not present

## 2018-03-21 DIAGNOSIS — E1122 Type 2 diabetes mellitus with diabetic chronic kidney disease: Secondary | ICD-10-CM | POA: Diagnosis not present

## 2018-03-21 NOTE — Telephone Encounter (Signed)
Assistance forms have been successfully faxed.

## 2018-03-24 ENCOUNTER — Other Ambulatory Visit: Payer: Self-pay | Admitting: Pharmacy Technician

## 2018-03-24 DIAGNOSIS — N2581 Secondary hyperparathyroidism of renal origin: Secondary | ICD-10-CM | POA: Diagnosis not present

## 2018-03-24 DIAGNOSIS — N186 End stage renal disease: Secondary | ICD-10-CM | POA: Diagnosis not present

## 2018-03-24 DIAGNOSIS — D631 Anemia in chronic kidney disease: Secondary | ICD-10-CM | POA: Diagnosis not present

## 2018-03-24 DIAGNOSIS — E1122 Type 2 diabetes mellitus with diabetic chronic kidney disease: Secondary | ICD-10-CM | POA: Diagnosis not present

## 2018-03-24 NOTE — Telephone Encounter (Signed)
Medication Samples have been provided to the patient.  Drug name: Praluent        Strength: 75 mg/mL       Qty: 1 LOT: [id number]    Exp.Date: 01/03/2019   Jamie Arias 12:38 PM   03/24/2018

## 2018-03-24 NOTE — Telephone Encounter (Signed)
Please try to contact the patient or her husband again. Thanks.

## 2018-03-24 NOTE — Telephone Encounter (Signed)
Patient calling the office for samples of medication:   1.  What medication and dosage are you requesting samples for?praluent 75 mg/ml INJ q 14 d   2.  Are you currently out of this medication? Yes pending assistance approval

## 2018-03-24 NOTE — Patient Outreach (Signed)
[place] [facility]) Care Management  03/24/2018  Jamie Arias 1955-07-21 [id number]    Care coordination call placed to [place] in regards to patient's Basaglar application.  Spoke to [place] who said the the application was received but had not been sent over to processing. Estill Bamberg said she would send the information over to processing. She said that the process could still take up to 5-10 business days before a determination is made.  Will followup with [employer] in 5-7 business days to inquire on status of application.  Jamie Arias P. Mera Gunkel, [place] Management [phone]

## 2018-03-24 NOTE — Telephone Encounter (Signed)
Forms have been filled out. Waiting on provider's signature.

## 2018-03-25 ENCOUNTER — Other Ambulatory Visit: Payer: Self-pay | Admitting: Pharmacist

## 2018-03-25 ENCOUNTER — Other Ambulatory Visit: Payer: Self-pay | Admitting: *Deleted

## 2018-03-25 NOTE — Patient Outreach (Signed)
[place] [facility]) Care Management  03/25/2018  Jamie Arias 1955/05/27 [id number]  Contacted patient to f/u on medication affordability. She notes that she has been able to afford all of her medications thus far, but has not needed to fill midodrine or calcium acetate yet. I informed her that nephrology and cardiology were working on the Tier Exception forms for those two medications.   She notes that blood sugar remain elevated >250. She has appointment scheduled with Dr. Caryl Bis on Friday, 03/28/2018. Encouraged patient to reach out should any questions or concerns arise. She has a phone call scheduled with RN CM Reginia Naas next week.  Catie Darnelle Maffucci, PharmD PGY2 Ambulatory Care Pharmacy Resident, [place] [facility] Phone: [phone]

## 2018-03-25 NOTE — Telephone Encounter (Signed)
LMTCB

## 2018-03-25 NOTE — Patient Outreach (Signed)
[place] [facility]) Care Management  03/25/2018  STARLEEN TRUSSELL 06/02/1955 [id number]  Second contact   Patient referred to this social worker to discuss issues/concerns regarding her Medicaid application and other financial concerns. Voicemail message left for a return call.   Plan: This social worker will attempt third outreach call within 3-4 business days.    Sheralyn Boatman [facility] Care Management [phone]

## 2018-03-25 NOTE — Telephone Encounter (Signed)
Praluent 75 mg/ML soln Auto-inject has been approved by [facility]   ID # [id number]  Start Date:03/12/2018 End Date:until further notice.

## 2018-03-26 DIAGNOSIS — D631 Anemia in chronic kidney disease: Secondary | ICD-10-CM | POA: Diagnosis not present

## 2018-03-26 DIAGNOSIS — E1122 Type 2 diabetes mellitus with diabetic chronic kidney disease: Secondary | ICD-10-CM | POA: Diagnosis not present

## 2018-03-26 DIAGNOSIS — N186 End stage renal disease: Secondary | ICD-10-CM | POA: Diagnosis not present

## 2018-03-26 DIAGNOSIS — N2581 Secondary hyperparathyroidism of renal origin: Secondary | ICD-10-CM | POA: Diagnosis not present

## 2018-03-27 ENCOUNTER — Encounter: Payer: Self-pay | Admitting: *Deleted

## 2018-03-28 ENCOUNTER — Other Ambulatory Visit: Payer: Self-pay | Admitting: *Deleted

## 2018-03-28 ENCOUNTER — Other Ambulatory Visit: Payer: Self-pay | Admitting: Pharmacy Technician

## 2018-03-28 ENCOUNTER — Ambulatory Visit: Payer: Medicare Other | Admitting: Family Medicine

## 2018-03-28 DIAGNOSIS — E1122 Type 2 diabetes mellitus with diabetic chronic kidney disease: Secondary | ICD-10-CM | POA: Diagnosis not present

## 2018-03-28 DIAGNOSIS — D631 Anemia in chronic kidney disease: Secondary | ICD-10-CM | POA: Diagnosis not present

## 2018-03-28 DIAGNOSIS — N2581 Secondary hyperparathyroidism of renal origin: Secondary | ICD-10-CM | POA: Diagnosis not present

## 2018-03-28 DIAGNOSIS — N186 End stage renal disease: Secondary | ICD-10-CM | POA: Diagnosis not present

## 2018-03-28 NOTE — Patient Outreach (Signed)
[place] [facility]) Care Management  03/28/2018  Jamie Arias 03/01/1956 [id number]   This social worker received voicemail message to return patient's call. However, patient did not call answer, when this social worker called back. This social worker left a additional message requesting a return call.   Plan: This Education officer, museum will call patient back within the next week.   Sheralyn Boatman [facility] Care Management [phone]

## 2018-03-28 NOTE — Patient Outreach (Signed)
[place] [facility]) Care Management  03/28/2018  Jamie Arias [birth year] [id number]    Care coordination call placed to [facility] in regards to patient's application for [place].  Spoke to [place] who said patient was APPROVED from 03/25/2018-03/05/2019. [name] informed me that patient medication of 3 boxes of Basaglar was out for delivery today to the provider's office.  Will followup with patient in 3-5 business days to confirm receipt of medication and to inquire if patient had any questions or concerns in regards to the [place].  Dickson Kostelnik P. Dartanyan Deasis, [place] Management [phone]

## 2018-03-31 ENCOUNTER — Other Ambulatory Visit: Payer: Self-pay | Admitting: *Deleted

## 2018-03-31 ENCOUNTER — Other Ambulatory Visit: Payer: Self-pay | Admitting: Family Medicine

## 2018-03-31 DIAGNOSIS — N2581 Secondary hyperparathyroidism of renal origin: Secondary | ICD-10-CM | POA: Diagnosis not present

## 2018-03-31 DIAGNOSIS — E1122 Type 2 diabetes mellitus with diabetic chronic kidney disease: Secondary | ICD-10-CM | POA: Diagnosis not present

## 2018-03-31 DIAGNOSIS — N186 End stage renal disease: Secondary | ICD-10-CM | POA: Diagnosis not present

## 2018-03-31 DIAGNOSIS — D631 Anemia in chronic kidney disease: Secondary | ICD-10-CM | POA: Diagnosis not present

## 2018-03-31 NOTE — Patient Outreach (Signed)
[place] [facility]) Care Management  03/31/2018  Jamie Arias 05/11/1955 [id number]   Third outreach call  Patient referred to this social worker to discuss issues/concerns regarding her Medicaid application and other financial concerns. Voicemail message left for a return call.   Plan: This social worker will plan to close case if call is not returned by 04/01/18.    Sheralyn Boatman [facility] Care Management [phone]

## 2018-03-31 NOTE — Telephone Encounter (Signed)
Copied from [place] [id number]. Topic: Quick Communication - Rx Refill/Question >> Mar 31, 2018 12:33 PM Sheran Luz wrote: Medication: gabapentin (NEURONTIN) 300 MG capsule  Patient is requesting a refill of this medication.    Preferred Pharmacy (with phone number or street name):[facility] [address], [place]  [phone] (Phone) [phone] (Fax

## 2018-04-01 ENCOUNTER — Other Ambulatory Visit: Payer: Self-pay | Admitting: *Deleted

## 2018-04-01 ENCOUNTER — Encounter: Payer: Self-pay | Admitting: *Deleted

## 2018-04-01 NOTE — Patient Outreach (Signed)
[place] [facility]) Care Management  04/01/2018  Jamie Arias Apr 05, 1955 [id number]   Return call from patient. Per patient, she is frustrated with her Insurance account manager through the Department of Social Services stating that she is trying to apply for Medicaid. Has submitted multiple receipts requested and cannot get a return call back despite several cal made to her office and messages left.  This Education officer, museum discussed the possibility of a visit to the Department of Social Services in order to obtain clarification regarding her application. This Education officer, museum discussed having no influence over the Department of Social Services. Patient stated that she did not think of that option but was open to visiting the office on Thursday to follow up on her application.   Plan: Patient plans to visit the Department of Social Services on Thursday.  This Education officer, museum will follow up within 2 weeks to assess for any additional community resource needs.    Sheralyn Boatman [facility] Care Management [phone]

## 2018-04-01 NOTE — Patient Outreach (Signed)
[place] [facility]) Care Management [place] Telephone Outreach Unsuccessful telephone outreach attempt # 1- engaged patient  04/01/2018  YONNA ALWIN 16-Oct-1955 [id number]  10:55 am: Unsuccessful telephone outreach attempt to [name], 63 y/o female referred to [place] by [facility] Pharmacist for patient concerns aorund managing health care in setting of multiple chronic conditions, as well as need for assistance with determination of medicaid eligibility.  Patient has had no recent ED or hospital visits.  Patient has history including, but not limited to HTN/ HLD; DM-Type II; CAD with NSTEMI (2014); sCHF; CKD/ ESRD on HD M-W-Fri; anemia; depression; and GERD.   HIPAA compliant voice mail message left for patient, requesting return call back.  Plan:  Will re-attempt [place] telephone outreach within 4 business days if I do not hear back from patient first.  Oneta Rack, RN, BSN, [place] Coordinator [facility] Care Management  [phone]

## 2018-04-02 ENCOUNTER — Other Ambulatory Visit: Payer: Self-pay | Admitting: Pharmacy Technician

## 2018-04-02 DIAGNOSIS — N186 End stage renal disease: Secondary | ICD-10-CM | POA: Diagnosis not present

## 2018-04-02 DIAGNOSIS — E1122 Type 2 diabetes mellitus with diabetic chronic kidney disease: Secondary | ICD-10-CM | POA: Diagnosis not present

## 2018-04-02 DIAGNOSIS — N2581 Secondary hyperparathyroidism of renal origin: Secondary | ICD-10-CM | POA: Diagnosis not present

## 2018-04-02 DIAGNOSIS — D631 Anemia in chronic kidney disease: Secondary | ICD-10-CM | POA: Diagnosis not present

## 2018-04-02 IMAGING — CR DG CHEST 2V
2 series · 2 of 2 positions shown · non-contrast
Comparison: Chest radiograph 07/22/2015

CLINICAL DATA: Nausea and vomiting. Chest pain. Shortness of
breath.

EXAM:
CHEST  2 VIEW

[chest lat]
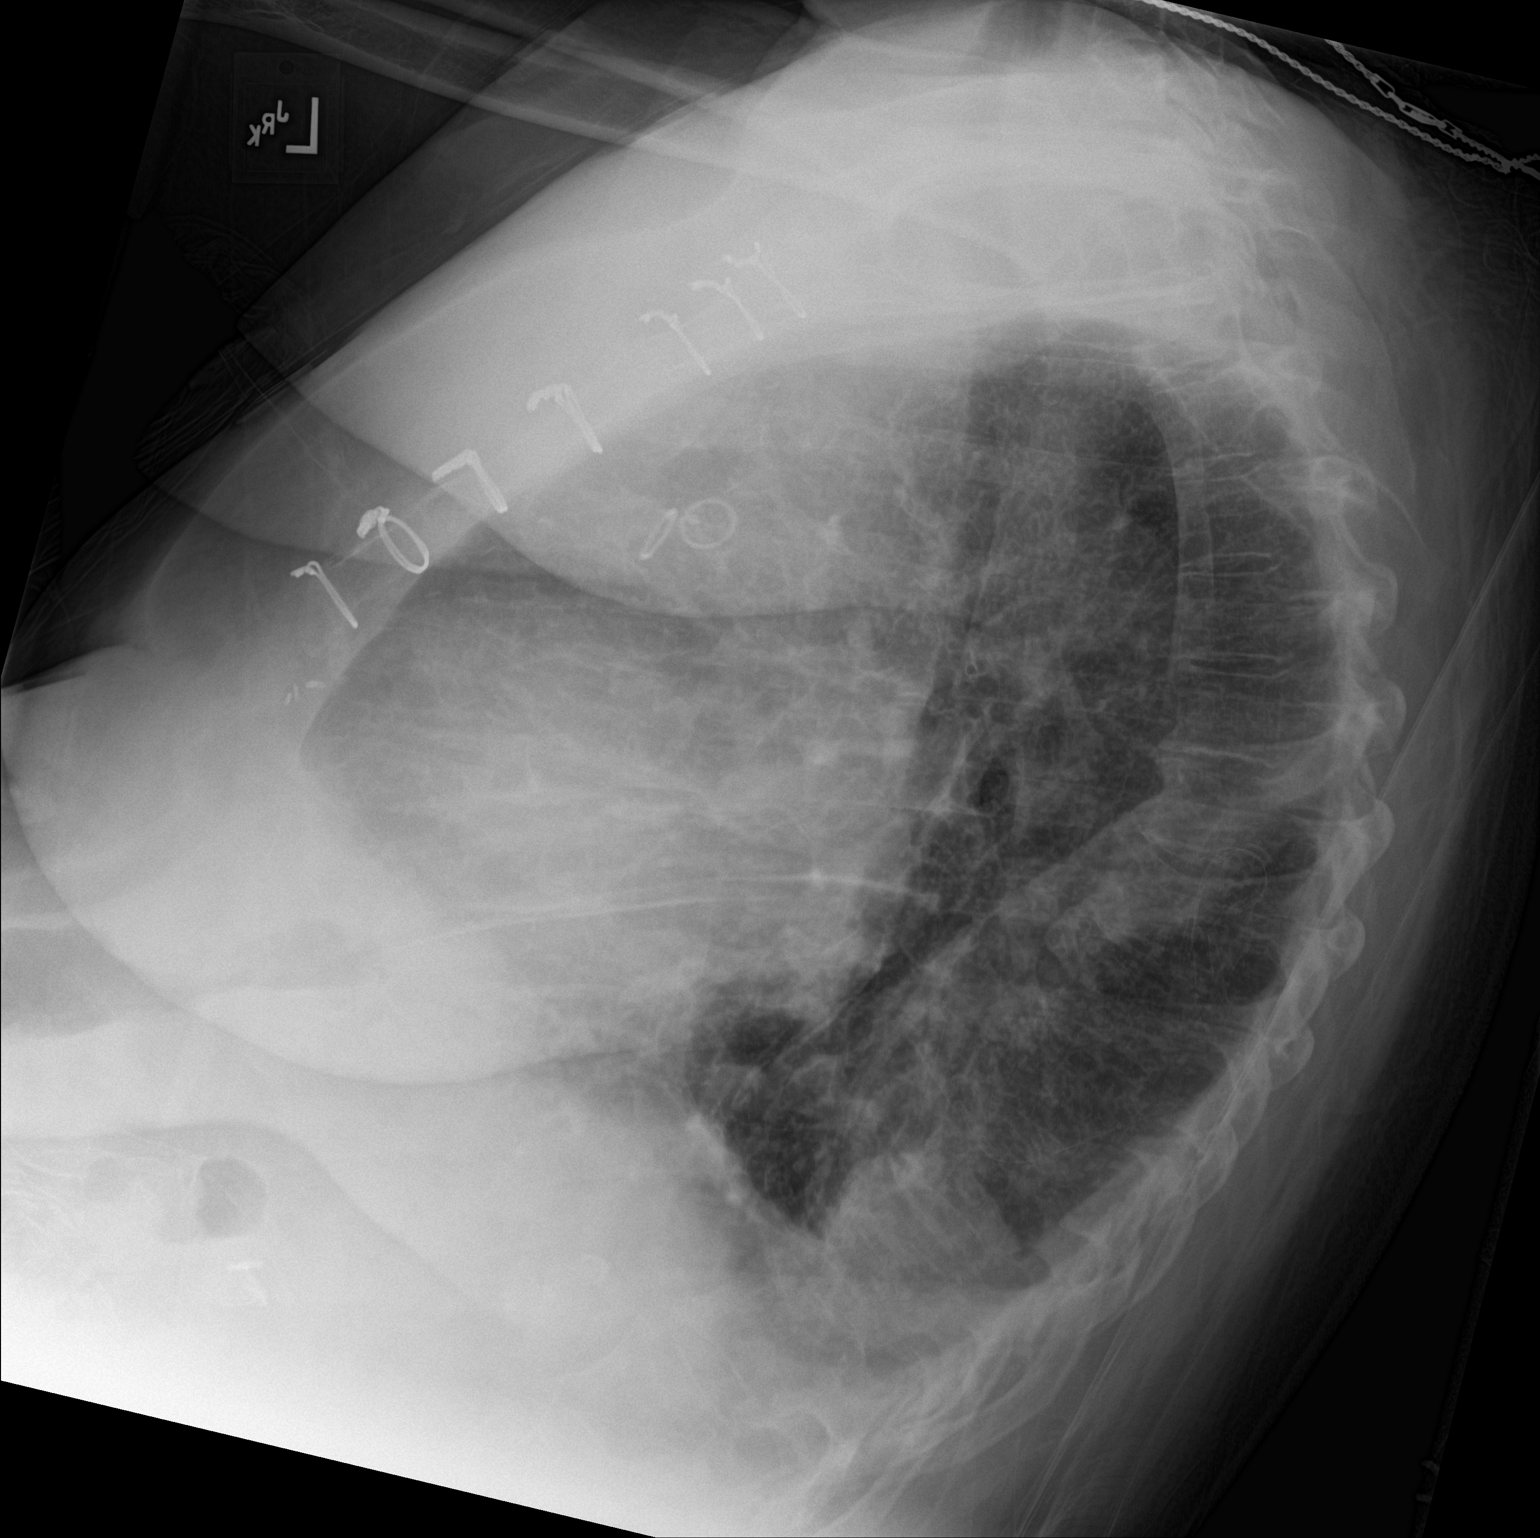

[chest ap]
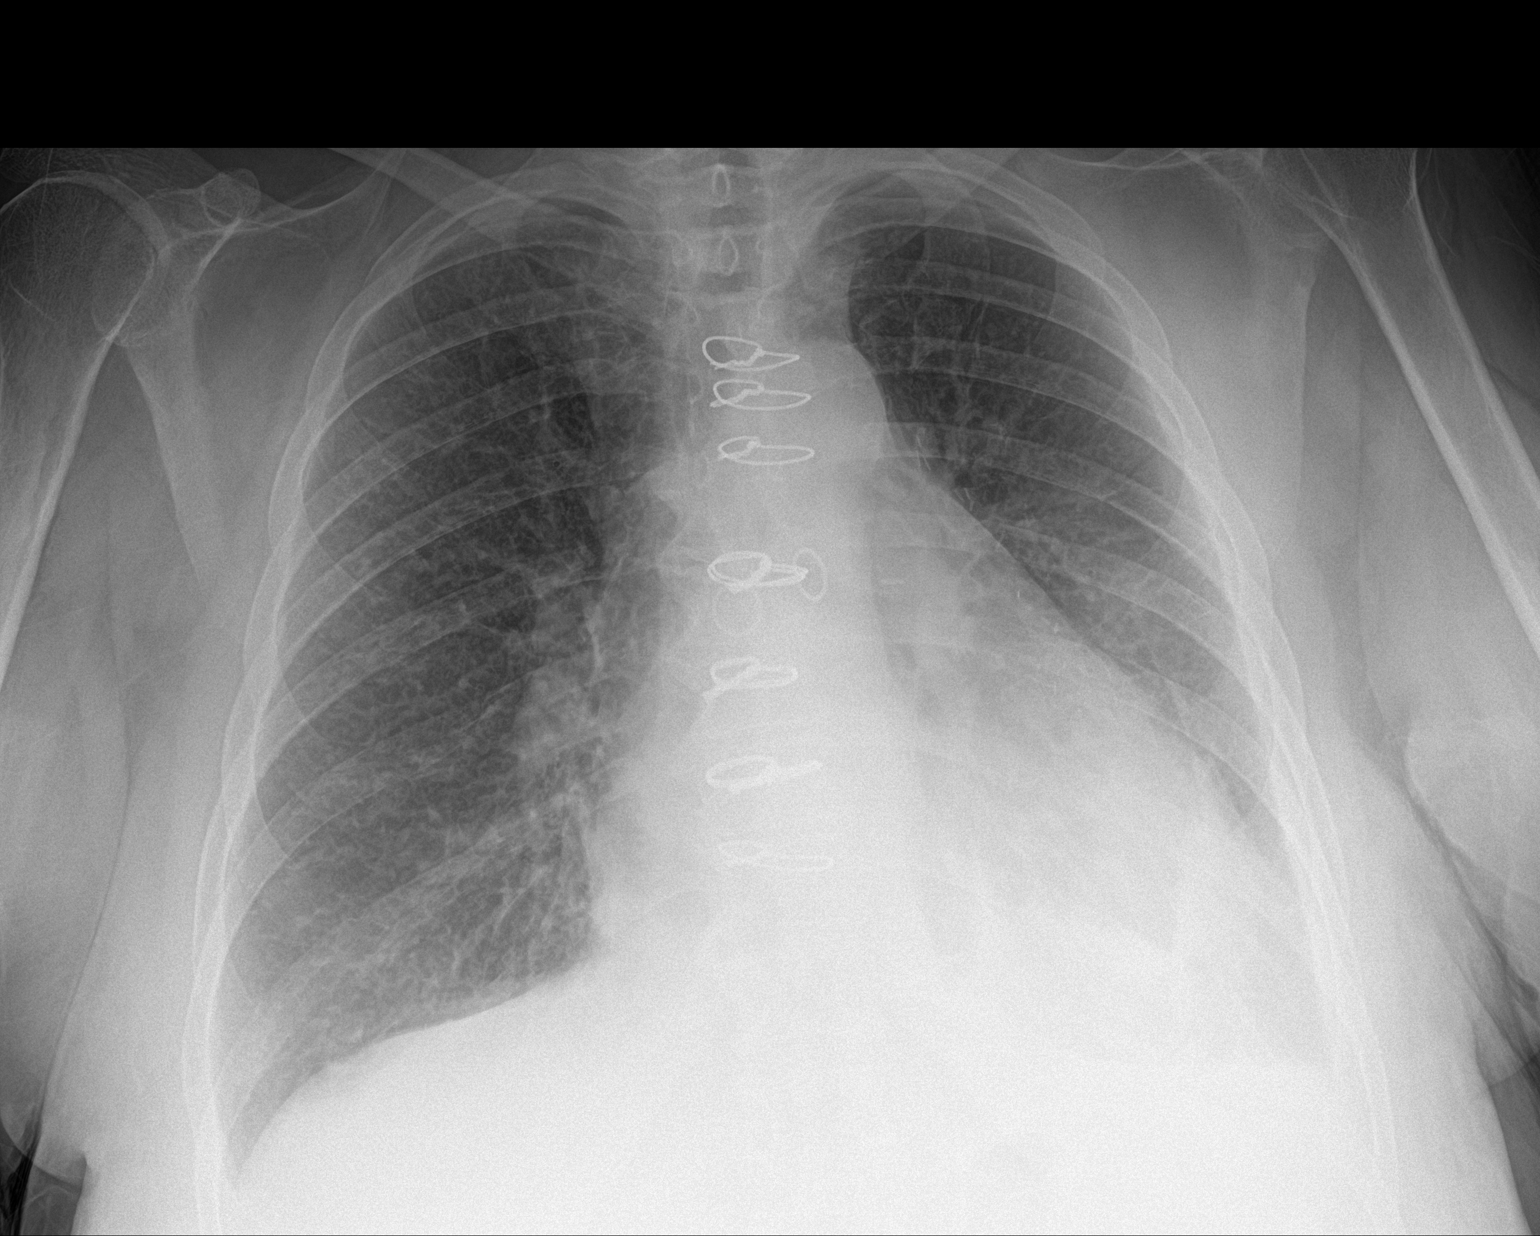

[2 of 2 positions shown; findings below may reference images not displayed]

FINDINGS: Unchanged CABG markers and median sternotomy wires. Persistent
cardiomegaly. Small left pleural effusion. Left basilar opacities.
No other area of consolidation. No pulmonary edema. No pneumothorax.
IMPRESSION: 1. Unchanged cardiomegaly with small left pleural effusion.
2. Left basilar opacities, likely atelectasis.

## 2018-04-02 NOTE — Patient Outreach (Signed)
[place] [facility]) Care Management  04/02/2018  SIGNE TACKITT 05-11-1955 [id number]  Unsuccessful outreach call placed to patient in regards to [employer] application for [employer].  Unfortunately patient did not answer the phone. HIPAA compliant voicemail left asking for a return call. Was calling to inquire if patient had picked up the [place] from the providers office and to instruct patient on how to obtain future refills/  Will followup with patient  In 3-5 business days if call is not returned.  Garyn Arlotta P. Rom[place], [place] Management [phone]

## 2018-04-02 NOTE — Telephone Encounter (Signed)
Forms successfully faxed to [facility] at [phone]

## 2018-04-03 ENCOUNTER — Other Ambulatory Visit: Payer: Self-pay | Admitting: *Deleted

## 2018-04-03 ENCOUNTER — Encounter: Payer: Self-pay | Admitting: *Deleted

## 2018-04-03 ENCOUNTER — Other Ambulatory Visit: Payer: Self-pay | Admitting: Pharmacy Technician

## 2018-04-03 NOTE — Patient Outreach (Signed)
Jamie Arias) Care Management [place] [facility] Telephone Outreach  04/03/2018  Jamie Arias March 02, 1956 [id number]  Successful telephone outreach attempt to Jamie Arias, 63 y/o female referred to [place] by [facility] Pharmacist for patient concerns around managing health care in setting of multiple chronic conditions, as well as need for assistance with determination of medicaid eligibility.  Patient has had no recent ED or Arias visits.  Patient has history including, but not limited to HTN/ HLD; DM-Type II; CAD with NSTEMI (2014); sCHF; CKD/ ESRD on HD M-W-Fri; anemia; depression; and GERD.  HIPAA/ identity verified with patient during phone call today.  Today, patient reports that she "is doing pretty good," and states that she been upset over the last few days around her daughter's state of health; states that she "can't really think about anything else," as her daughter has been sick and fears she may have cancer- emotional support provided. Patient denies pain and new/ recent falls, and she sounds to be in no obvious distress throughout phone call today.  Patient further reports:  -- Has all medicationsand takes as prescribed;denies questions about current medications; confirms that she continues working with Jamie Arias team around medication assistance needs; shared with patient that from review of EMR, it appears that Jamie Arias team has attempted to contact her in follow up recently, and encouraged patient to call Jamie Arias team back promptly; provided phone numbers for [place] teams and patient agreed that she would contact pharmacy team as "soon as we hang up."  -- has attended all established HD sessions, "hasn't missed any." Positive reinforcement provided; patient states that HD sessions continue to "exhaust" her, and states that on days of HD, it "is hard to do anything else."  Reports that she had to cancel previously scheduled PCP office visit  due to "feeling so sick and nauseated" after a HD session, as the PCP office visit was scheduled on an HD day.  Patient reports she re-scheduled visit, but that the office "didn't have anything available" for several weeks; discussed option of patient re-contacting PCP office and asking to be put on cancellation list for hopeful sooner appointment, and patient stated that she would do.  Continues to report that her husband will provide transportation  -- confirms that she has spoken to Jamie Arias around her concerns with being unable to communicate with medicaid staff around the status of her medicaid benefit; encouraged patient to follow advice of Jamie Arias to physically go to Jamie Arias office to obtain follow up in person, and she stated she had planned to do today, but is "too upset" around "everything going on with daughter."  Encouraged patient to promptly make this visit, and she agrees to do so.  Self-health management of multiple chronic disease states including CHF; CKD-ESRD; and DM: -- as above, endorses adherence to all HD sessions M-W-F -- confirms that she received previously mailed printed educational material around self-health management of chronic disease state of DM, but has "not had time to review yet;" encouraged patient to begin reviewing and to write down any questions she has for further discussion during Jamie Arias outreach, and she agrees to do -- continues to monitor/ record fasting blood sugars daily, states that they "have remained really high," and reports ranges over last week between 240-280; with fasting blood sugar this morning of "238."  Continues to deny ability to monitor blood sugars more frequently at home, stating that her insurance company "only approves  me to have enough test strips for once a day checks." -- patient denies any recent changes made to her dietary choices, stating that she "just can't make any changes" until the health issue with her daughter  is "under control." -- patient was encouraged to begin making small dietary changes with snacks/ meals if possible, and she stated she "would try."  Patient denies further issues, concerns, or problems today. I confirmed that patient hasmy direct phone number, the main Jamie Arias CM office phone number, and the Jamie Arias CM 24-hour nurse advice phone number should issues arise prior to next scheduled Jamie Arias outreach by telephone next month.  Encouraged patient to contact me directly if needs, questions, issues, or concerns arise prior to next scheduled outreach; patient agreed to do so.   Plan:  Patient will take medications as prescribed and will attend all scheduled provider appointments  Patient will promptly notify care providers for any new concerns/ issues/ problems that arise  Patient will continue monitoring/ recording daily fasting blood sugars  Patient will actively communicate with Jamie Arias and Jamie Arias teams around her stated need for assistance with medications and medicaid follow up   Patient will begin reviewing printed educational material around self-health management of chronic disease state of DM  Jamie [facility] CM outreach to continue with scheduled phone call next month  Jamie Arias CM Care Plan Problem One     Most Recent Value  Care Plan Problem One  Medication concerns related to patient inability to afford prescribed medications, as evidenced by patient reporting   Role Documenting the Problem One  Care Management Jamie Arias for Problem One  Active  Jamie Long Term Goal   Over the next 31 days, patient will continue working with Jamie Arias team for patient assistance in affording her medications, as evidenced by patient reporting and collaboration with Jamie Arias pharmacy team, as indicated during [place] outreach  [place] Term Goal Start Date  03/18/18  Interventions for Problem One Long Term Goal  Confirmed with patient that she continues communicating with  [place] CM around medication assistance needs,  provided phone number to [place] team and encouraged patient to return calls to [place] team promptly  [place] Arias CM Short Term Goal #1   Over the next 30 days, patient will verbalize ongoing plan for medication assistance needs/ medication management, as evidenced by patient reporting/ collaboration with [place] team during [place] outreach   [facility] CM Short Term Goal #1 Start Date  04/03/18  Interventions for Short Term Goal #1  Confirmed with patient that she has ongoing concerns around affording some of her medications,  discussed overall status of patient's current medications with her and confirmed that she currently has all medications and is taking as prescribed    [facility] Ari[facility] CM Care Plan Problem Two     Most Recent Value  Care Plan Problem Two  Patient uncertainty around status of Medicaid application, as evidenced by patient reporting  Role Documenting the Problem Two  Care Management Coordinator  Care Plan for Problem Two  Active  Interventions for Problem Two Long Term Goal   Confirmed with patient that she has spoken with [facility] Arias around her stated concerns in following up with medicaid staff,  encouraged patient to adhere to advice provided by [facility] Arias and to maintain contact with [facility]  [facility] Long Term Goal  Over the next 31 days, patient will communicate with [facility] Arias to have her concerns  around status of medicaid addressed, as evidenced by patient reporting and collaboration with [facility] Arias as indicated during [place] Term Goal Start Date  03/18/18    [facility] CM Care Plan Problem Three     Most Recent Value  Care Plan Problem Three  Self-health management of chronic disease state of DM in setting of concurrent CKD/ ESRD, as evidenced by patient reporting   Role Documenting the Problem Three  Care Management Coordinator  Care Plan for Problem Three  Active  Jamie Long Term Goal   Over the next 45 days, patient will  verbalize 3 strategies to decrease blood sugars, as evidenced by patient reporting during [place] CM outreach  [place] Term Goal Start Date  03/18/18  Interventions for Problem Three Long Term Goal  Confirmed that patient received printed educational material placed in mail to her,  encouraged her to review thoroughly and to write down her questions for further discussion during [place] outreach,  confirmed that patient continues taking prescribed medicaitons for diabetes, as ordered  Jamie CM Short Term Goal #1   Over the next 30 days, patient will continue monitoring and recording fasting blood sugars, as evidenced by review of same/ patient reporting during [place] outreach [Goal modified/ extended today]  Jamie CM Short Term Goal #1 Start Date  04/03/18 [Goal extended]  Interventions for Short Term Goal #1  Reviewed with patient her recently recorded fasting blood sugars at home and encouraged her to conitnue monitoring and recording and to begin making changes to diet around printed educational material mailed to her  [facility] [place] CM Short Term Goal #2   Over the next 30 days, patient will attend all scheduled provider and HD appointments, as evidenced by patient reporting/ provider collaboration as indicated during [facility] RN CCM outreach  [place] Arias CM Short Term Goal #2 Start Date  03/18/18  Interventions for Short Term Goal #2  Confirmed with patient that she has attended all established HD sessions and has not missed any HD sessions- positive reinforcement provided,  discussed with patient upcoming scheduled provider appointments and encouraged her to contact PCP to be placed on list for any cancelled office visits where she could hopefully be re-scheduled sooner     Oneta Rack, RN, BSN, [employer] Coordinator [facility] Care Management  [phone]

## 2018-04-03 NOTE — Patient Outreach (Signed)
[facility]) Care Management  04/03/2018  Jamie Arias [birth year] [id number]  Incoming call received from patient in regards to [facility] patient assistance application for [employer].  Patient stated she had just had a conversation with [facility] RN Reginia Naas and Richarda Osmond had informed patient that I was trying to reach her.   Inquired if patient had picked up her Basaglar from there provider's office Dr Caryl Bis. Patient stated she did not realize it had been delivered. Informed patient that 3 boxes of Basaglar was delivered. We then discussed how patient was to obtain her refills. Patient verbalized understanding. Inquired if patient had any other questions, problems or concerns that I could help with and she stated not at this time. Confirmed patient had our name and number(s) if any additional concerns were to arise.  Will route note to [place] for case closure for pharmacy assistance.  Ginnie Marich P. Izyk Marty, [place] Management [phone]

## 2018-04-03 NOTE — Telephone Encounter (Signed)
Received Fax from [facility] Drug Utilization  ID # [id number] Reason for Approval : The requested medication is covered on the formulary and does not require a coverage Determination Request.  Please call the Pharmacy to process the prescription claim. Approval Timeframe : Start Date 04/02/2018 End Date: 03/05/2019 Pharmacy notified to process this approval.

## 2018-04-04 ENCOUNTER — Other Ambulatory Visit: Payer: Self-pay

## 2018-04-04 ENCOUNTER — Telehealth: Payer: Self-pay | Admitting: *Deleted

## 2018-04-04 DIAGNOSIS — N2581 Secondary hyperparathyroidism of renal origin: Secondary | ICD-10-CM | POA: Diagnosis not present

## 2018-04-04 DIAGNOSIS — E1122 Type 2 diabetes mellitus with diabetic chronic kidney disease: Secondary | ICD-10-CM | POA: Diagnosis not present

## 2018-04-04 DIAGNOSIS — Z992 Dependence on renal dialysis: Secondary | ICD-10-CM | POA: Diagnosis not present

## 2018-04-04 DIAGNOSIS — N186 End stage renal disease: Secondary | ICD-10-CM | POA: Diagnosis not present

## 2018-04-04 DIAGNOSIS — D631 Anemia in chronic kidney disease: Secondary | ICD-10-CM | POA: Diagnosis not present

## 2018-04-04 MED ORDER — ALIROCUMAB 75 MG/ML ~~LOC~~ SOAJ: 75.0000 mg | SUBCUTANEOUS | 11 refills | Status: DC

## 2018-04-04 MED ORDER — MIDODRINE HCL 10 MG PO TABS: ORAL_TABLET | ORAL | 0 refills | Status: DC

## 2018-04-04 MED ORDER — MIDODRINE HCL 5 MG PO TABS: ORAL_TABLET | ORAL | 0 refills | Status: DC

## 2018-04-04 NOTE — Telephone Encounter (Signed)
Patient calling to check status of approval

## 2018-04-04 NOTE — Telephone Encounter (Signed)
Spoke with patients Husband (OK Per DPR) Made him aware that praluent and midodrine was approved and have been sent to the pharmacy

## 2018-04-04 NOTE — Telephone Encounter (Signed)
Received from [facility] - Drug Utilization Review form. ID# [id number]. Regarding Midodrine 10 mg tablet. Said "Reason for denial" but yesterday we received approval as documented in prior encounter.  Called and s/w representative who said that the midodrine is approved and nothing further is needed at this time. He assured me this was correct and approved. Call reference # [id number].

## 2018-04-07 ENCOUNTER — Telehealth: Payer: Self-pay | Admitting: Cardiovascular Disease

## 2018-04-07 ENCOUNTER — Other Ambulatory Visit: Payer: Self-pay | Admitting: Family Medicine

## 2018-04-07 DIAGNOSIS — N186 End stage renal disease: Secondary | ICD-10-CM | POA: Diagnosis not present

## 2018-04-07 DIAGNOSIS — D631 Anemia in chronic kidney disease: Secondary | ICD-10-CM | POA: Diagnosis not present

## 2018-04-07 DIAGNOSIS — E1122 Type 2 diabetes mellitus with diabetic chronic kidney disease: Secondary | ICD-10-CM | POA: Diagnosis not present

## 2018-04-07 DIAGNOSIS — N2581 Secondary hyperparathyroidism of renal origin: Secondary | ICD-10-CM | POA: Diagnosis not present

## 2018-04-07 MED ORDER — GABAPENTIN 300 MG PO CAPS: ORAL_CAPSULE | ORAL | 0 refills | Status: DC

## 2018-04-07 NOTE — Telephone Encounter (Signed)
Left a message for the patient to call back.  

## 2018-04-07 NOTE — Telephone Encounter (Signed)
Please call to discuss application for Praluent.

## 2018-04-07 NOTE — Telephone Encounter (Signed)
Requested Prescriptions  Pending Prescriptions Disp Refills  . gabapentin (NEURONTIN) 300 MG capsule 270 capsule 0    Sig: TAKE 1 CAPSULE(300 MG) BY MOUTH THREE TIMES DAILY     Neurology: Anticonvulsants - gabapentin Passed - 04/07/2018 11:57 AM      Passed - Valid encounter within last 12 months    Recent Outpatient Visits          6 months ago Hypotension, unspecified hypotension type   [facility], Angela Adam, MD   7 months ago ESRD on dialysis [facility])   [facility] [name], MD   7 months ago Type 2 diabetes mellitus with diabetic neuropathy, without long-term current use of insulin [facility])   [place], [place], [place]   8 months ago Chronic neck pain   [place], Angela Adam, MD   9 months ago Type 2 diabetes mellitus with diabetic neuropathy, with long-term current use of insulin [facility])   [place], [place], [facility]      Future Appointments            In 2 weeks O'Brien-Blaney, [place], LPN [place], [place]   In 1 month Sonnenberg, Angela Adam, MD [facility], [facility]

## 2018-04-07 NOTE — Telephone Encounter (Signed)
Copied from [place] [phone]. Topic: Quick Communication - Rx Refill/Question >> Apr 07, 2018 11:04 AM Leward Quan A wrote: Medication: gabapentin (NEURONTIN) 300 MG capsule  Has the patient contacted their pharmacy? Yes.   (Agent: If no, request that the patient contact the pharmacy for the refill.) (Agent: If yes, when and what did the pharmacy advise?)  Preferred Pharmacy (with phone number or street name): [facility] [address], [place] - [place] [phone] (Phone) [phone] (Fax)    Agent: Please be advised that RX refills may take up to 3 business days. We ask that you follow-up with your pharmacy.

## 2018-04-08 ENCOUNTER — Ambulatory Visit: Payer: Self-pay | Admitting: *Deleted

## 2018-04-08 ENCOUNTER — Ambulatory Visit: Payer: Medicare Other

## 2018-04-09 ENCOUNTER — Other Ambulatory Visit: Payer: Self-pay | Admitting: *Deleted

## 2018-04-09 DIAGNOSIS — N186 End stage renal disease: Secondary | ICD-10-CM | POA: Diagnosis not present

## 2018-04-09 DIAGNOSIS — D631 Anemia in chronic kidney disease: Secondary | ICD-10-CM | POA: Diagnosis not present

## 2018-04-09 DIAGNOSIS — N2581 Secondary hyperparathyroidism of renal origin: Secondary | ICD-10-CM | POA: Diagnosis not present

## 2018-04-09 DIAGNOSIS — E1122 Type 2 diabetes mellitus with diabetic chronic kidney disease: Secondary | ICD-10-CM | POA: Diagnosis not present

## 2018-04-09 NOTE — Patient Outreach (Addendum)
[facility]) Care Management  04/09/2018  Jamie Arias 10-02-1955 [id number]   Late Entry-Phone call to patient on 04/08/18 to follow up on questions regarding her Medicaid and ability to get in touch with her Medicaid worker.  Patient did no answer, voicemail message left for a return call.   Sheralyn Boatman [facility] Care Management [phone]

## 2018-04-09 NOTE — Telephone Encounter (Signed)
Patient calling the office for samples of medication:   1.  What medication and dosage are you requesting samples for? Praluent 75 mg/mL inj q 14 d   2.  Are you currently out of this medication? Yes   Pending assistance approval

## 2018-04-09 NOTE — Telephone Encounter (Signed)
Patient notified sample available of Praluent pen   Medication Samples have been provided to the patient.  Drug name: Praluent    Strength: 75 mg/mL        Qty: One pre filled pen   LOT: [id number] Exp.Date: 01/03/2019   Jamie Arias 11:55 AM 04/09/2018

## 2018-04-09 NOTE — Telephone Encounter (Signed)
Patient calling to check status of Praluent medication assistance approval

## 2018-04-09 NOTE — Telephone Encounter (Signed)
Called Praluent patient assistance. They need [place] to resubmit the prescription with the date. Physician signature is there but they also need a date.   Could not locate application at this time. Routing to [employer] RN to see if she still has the prescriptions to refax with date.

## 2018-04-10 ENCOUNTER — Other Ambulatory Visit: Payer: Self-pay | Admitting: *Deleted

## 2018-04-10 NOTE — Telephone Encounter (Signed)
Documents have been successfully faxed again with dates.

## 2018-04-10 NOTE — Patient Outreach (Addendum)
[facility]) Care Management  04/10/2018  Jamie Arias 10/12/1955 [id number]   Phone call to patient to follow up on questions regarding her Medicaid and ability to get in touch with her Medicaid worker. Patient did not answer, voicemail message left for a return call.  Plan:This Education officer, museum to send patient a Economist and will make second attempt to reach patient within 3-4 business days.    Sheralyn Boatman [facility] Care Management [phone]

## 2018-04-11 ENCOUNTER — Other Ambulatory Visit: Payer: Self-pay | Admitting: *Deleted

## 2018-04-11 DIAGNOSIS — D631 Anemia in chronic kidney disease: Secondary | ICD-10-CM | POA: Diagnosis not present

## 2018-04-11 DIAGNOSIS — N186 End stage renal disease: Secondary | ICD-10-CM | POA: Diagnosis not present

## 2018-04-11 DIAGNOSIS — N2581 Secondary hyperparathyroidism of renal origin: Secondary | ICD-10-CM | POA: Diagnosis not present

## 2018-04-11 DIAGNOSIS — E1122 Type 2 diabetes mellitus with diabetic chronic kidney disease: Secondary | ICD-10-CM | POA: Diagnosis not present

## 2018-04-11 NOTE — Patient Outreach (Signed)
[facility]) Care Management  04/11/2018  Jamie Arias [birth year] [id number]   Phone call to patient to follow up on questions regarding her Medicaid and ability to visit the Department of Social Services Medicaid worker.  Patient did not answer, voicemail message left for a return call.   Sheralyn Boatman [facility] Care Management [phone]

## 2018-04-14 DIAGNOSIS — D631 Anemia in chronic kidney disease: Secondary | ICD-10-CM | POA: Diagnosis not present

## 2018-04-14 DIAGNOSIS — N186 End stage renal disease: Secondary | ICD-10-CM | POA: Diagnosis not present

## 2018-04-14 DIAGNOSIS — N2581 Secondary hyperparathyroidism of renal origin: Secondary | ICD-10-CM | POA: Diagnosis not present

## 2018-04-14 DIAGNOSIS — E1122 Type 2 diabetes mellitus with diabetic chronic kidney disease: Secondary | ICD-10-CM | POA: Diagnosis not present

## 2018-04-15 ENCOUNTER — Other Ambulatory Visit: Payer: Self-pay | Admitting: Pharmacist

## 2018-04-15 NOTE — Patient Outreach (Signed)
[facility]) Care Management  04/15/2018  Jamie Arias [birth year] [id number]  Contacted patient to f/u on medication access, as well as discuss blood sugars. Readings were quite elevated on our last phone call discussion, and she had an appointment with PCP Dr. Caryl Bis later that week, but this appointment was cancelled.   Left HIPAA compliant message for patient to return my call at her convenience.   Plan - Will f/u next Tuesday, 2/18, if I have not heard back from her.   Catie Darnelle Maffucci, PharmD, [place] PGY2 Ambulatory Care Pharmacy Resident, [place] Network Phone: [phone]

## 2018-04-16 DIAGNOSIS — D631 Anemia in chronic kidney disease: Secondary | ICD-10-CM | POA: Diagnosis not present

## 2018-04-16 DIAGNOSIS — N186 End stage renal disease: Secondary | ICD-10-CM | POA: Diagnosis not present

## 2018-04-16 DIAGNOSIS — E1122 Type 2 diabetes mellitus with diabetic chronic kidney disease: Secondary | ICD-10-CM | POA: Diagnosis not present

## 2018-04-16 DIAGNOSIS — N2581 Secondary hyperparathyroidism of renal origin: Secondary | ICD-10-CM | POA: Diagnosis not present

## 2018-04-18 ENCOUNTER — Other Ambulatory Visit: Payer: Self-pay | Admitting: *Deleted

## 2018-04-18 DIAGNOSIS — N2581 Secondary hyperparathyroidism of renal origin: Secondary | ICD-10-CM | POA: Diagnosis not present

## 2018-04-18 DIAGNOSIS — D631 Anemia in chronic kidney disease: Secondary | ICD-10-CM | POA: Diagnosis not present

## 2018-04-18 DIAGNOSIS — E1122 Type 2 diabetes mellitus with diabetic chronic kidney disease: Secondary | ICD-10-CM | POA: Diagnosis not present

## 2018-04-18 DIAGNOSIS — N186 End stage renal disease: Secondary | ICD-10-CM | POA: Diagnosis not present

## 2018-04-18 NOTE — Patient Outreach (Signed)
[facility]) Care Management  04/18/2018  KAMORA VOSSLER 1955/06/22 [id number]   Phone call from patient stating that her husband went to the Department of Social Services today to obtain answers regarding patient's medicaid. Per patient's spouse, he still was not able to speak directly to the [facility] worker, however received word that she was still working on patient's case and that patient  needed additional medical receipts to turn in to make a determination of medicaid eligibility.  This social worker recommended that once patient obtains the required receipts, that she make an appointment to see the Medicaid worker to obtain an update on the application process. Patient agrees to make an appointment, the next time they visit the Medicaid worker.   Patient discussed receiving some news regarding her daughter's health. Emotional support provided.  This Education officer, museum will sign off at this time. Patient encouraged to call this social worker if there are further questions or concerns.    Sheralyn Boatman [facility] Care Management [phone]

## 2018-04-21 DIAGNOSIS — N186 End stage renal disease: Secondary | ICD-10-CM | POA: Diagnosis not present

## 2018-04-21 DIAGNOSIS — N2581 Secondary hyperparathyroidism of renal origin: Secondary | ICD-10-CM | POA: Diagnosis not present

## 2018-04-21 DIAGNOSIS — E1122 Type 2 diabetes mellitus with diabetic chronic kidney disease: Secondary | ICD-10-CM | POA: Diagnosis not present

## 2018-04-21 DIAGNOSIS — D631 Anemia in chronic kidney disease: Secondary | ICD-10-CM | POA: Diagnosis not present

## 2018-04-22 ENCOUNTER — Telehealth: Payer: Self-pay | Admitting: Pharmacist

## 2018-04-22 ENCOUNTER — Other Ambulatory Visit: Payer: Self-pay | Admitting: *Deleted

## 2018-04-22 ENCOUNTER — Encounter: Payer: Self-pay | Admitting: *Deleted

## 2018-04-22 ENCOUNTER — Ambulatory Visit: Payer: Medicare Other

## 2018-04-22 NOTE — Patient Outreach (Signed)
[facility]) Care Management [place] Telephone Outreach  04/22/2018  [name] 30-Apr-1955 [id number]  Received off-hours voice message from Ramiro Harvest, spouse/ caregiver of Jamie Arias, 63 y/o female referred to [place] by [facility] Pharmacist for patient concerns around managing health care in setting of multiple chronic conditions,as well as need for assistance with determination of medicaid eligibility. Patient has had no recent ED or hospital visits. Patient has history including, but not limited toHTN/ HLD; DM-Type II; CAD with NSTEMI (2014); sCHF; CKD/ ESRD on HD M-W-Fri; anemia; depression; and GERD.   Patient provided previous verbal consent to speak with her spouse at any time; in his voice message, patient's spouse stated that patient would not be able to talk with me today for our previously scheduled phone call; spouse stated that patient has been very upset around the health of her daughter and is not up to talking.  Patient's spouse requested that I contact him in follow up.  Returned call to caregiver this morning upon receiving voice message; left caregiver HIPAA compliant voice message acknowledging that I received his message; in my message back to him, I shared that I would attempt to re-contact patient again next week.  Plan:  Will re-schedule today's phone call to next week as requested by caregiver  Oneta Rack, RN, BSN, [place] Coordinator [facility] Care Management  [phone]

## 2018-04-22 NOTE — Telephone Encounter (Signed)
Contacted patient to f/u on medication access, blood sugars. Noticed that patient canceled her AWV visit today with Denisa O'Brien-Blaney, LPN.   Left HIPAA compliant message for patient to return my call at her convenience.   Catie Darnelle Maffucci, PharmD, [place] PGY2 Ambulatory Care Pharmacy Resident, [place] [facility] Phone: [phone]

## 2018-04-23 DIAGNOSIS — D631 Anemia in chronic kidney disease: Secondary | ICD-10-CM | POA: Diagnosis not present

## 2018-04-23 DIAGNOSIS — E1122 Type 2 diabetes mellitus with diabetic chronic kidney disease: Secondary | ICD-10-CM | POA: Diagnosis not present

## 2018-04-23 DIAGNOSIS — N2581 Secondary hyperparathyroidism of renal origin: Secondary | ICD-10-CM | POA: Diagnosis not present

## 2018-04-23 DIAGNOSIS — N186 End stage renal disease: Secondary | ICD-10-CM | POA: Diagnosis not present

## 2018-04-25 DIAGNOSIS — N186 End stage renal disease: Secondary | ICD-10-CM | POA: Diagnosis not present

## 2018-04-25 DIAGNOSIS — D631 Anemia in chronic kidney disease: Secondary | ICD-10-CM | POA: Diagnosis not present

## 2018-04-25 DIAGNOSIS — E1122 Type 2 diabetes mellitus with diabetic chronic kidney disease: Secondary | ICD-10-CM | POA: Diagnosis not present

## 2018-04-25 DIAGNOSIS — N2581 Secondary hyperparathyroidism of renal origin: Secondary | ICD-10-CM | POA: Diagnosis not present

## 2018-04-28 DIAGNOSIS — E1122 Type 2 diabetes mellitus with diabetic chronic kidney disease: Secondary | ICD-10-CM | POA: Diagnosis not present

## 2018-04-28 DIAGNOSIS — N186 End stage renal disease: Secondary | ICD-10-CM | POA: Diagnosis not present

## 2018-04-28 DIAGNOSIS — N2581 Secondary hyperparathyroidism of renal origin: Secondary | ICD-10-CM | POA: Diagnosis not present

## 2018-04-28 DIAGNOSIS — D631 Anemia in chronic kidney disease: Secondary | ICD-10-CM | POA: Diagnosis not present

## 2018-04-29 ENCOUNTER — Telehealth: Payer: Self-pay | Admitting: Cardiovascular Disease

## 2018-04-29 ENCOUNTER — Other Ambulatory Visit: Payer: Self-pay | Admitting: Pharmacist

## 2018-04-29 NOTE — Patient Outreach (Addendum)
[place] [facility]) Care Management  04/29/2018  Jamie Arias 1956/03/05 [id number]  Contacted patient to follow up on medication affordability and blood sugars. She notes that her prescriptions are still more expensive than with her previous health insurance last year, but that receiving Basaglar through patient assistance has helped tremendously.   She notes that she is extremely stressed and upset regarding her daughter's health, and has been trying to help her out.   She reports fasting blood sugars "150-180s", and occasionally in the low 200s if she forgets to check before breakfast and checks after breakfast. She confirms that she is taking Basaglar 30 units daily. She denies any episodes of hypoglycemia.   She has not had an appointment with Dr. Caryl Bis since 09/17/2017. Encouraged patient to keep her appointment scheduled on 05/27/2018 to follow up on her chronic medical conditions.   Will close case at this time due to goals of care being met. Patient has my contact information for any future questions or concerns.   Catie Darnelle Maffucci, PharmD, [place] PGY2 Ambulatory Care Pharmacy Resident, [place] [facility] Phone: [phone]

## 2018-04-29 NOTE — Telephone Encounter (Signed)
New Message  Pts husband verbalized pt is supposed to be taking pralunt a cholesterol rx.  Pts husband verbalized insurance changed and now it's $600.00.   Pts husband verbalized he filled out paperwork and he's calling back to check the status of the application.  Pts husband also verbalized she is supposed to take Rx every two weeks and tomorrow is the two weeks for patient to take medication.  Please f/u with pt

## 2018-04-29 NOTE — Telephone Encounter (Signed)
Spoke with husband. States they have not heard anything from the patient assistance process for Praluent. Patient is due for next dose tomorrow. Advised I can leave a sample of medication for tomorrow's dose and I will call and see where we are in the process.   Called Praluent Patient Assistance. The application has been received. They just need the prior authorization letter from insruance for the [place] faxed to them. Letter located and faxed to them. While on the phone, representative was able to approve patient for assistance to receive the medication from today's date until the end of the year.  Called husband and let him know she was approved and that someone would call them between Friday and Monday to discuss shipment. He will come by tomorrow to pick up the sample.  Medication Samples have been provided to the patient.  Drug name: Praluent       Strength: 75 mg/ml        Qty: 1 injection  LOT: [id number]  Exp.Date: 01/03/2019

## 2018-04-30 DIAGNOSIS — N2581 Secondary hyperparathyroidism of renal origin: Secondary | ICD-10-CM | POA: Diagnosis not present

## 2018-04-30 DIAGNOSIS — E1122 Type 2 diabetes mellitus with diabetic chronic kidney disease: Secondary | ICD-10-CM | POA: Diagnosis not present

## 2018-04-30 DIAGNOSIS — D631 Anemia in chronic kidney disease: Secondary | ICD-10-CM | POA: Diagnosis not present

## 2018-04-30 DIAGNOSIS — N186 End stage renal disease: Secondary | ICD-10-CM | POA: Diagnosis not present

## 2018-05-01 ENCOUNTER — Other Ambulatory Visit: Payer: Self-pay | Admitting: *Deleted

## 2018-05-01 ENCOUNTER — Encounter: Payer: Self-pay | Admitting: *Deleted

## 2018-05-01 NOTE — Patient Outreach (Signed)
[facility]) Care Management [place] Telephone Outreach Unsuccessful consecutive telephone outreach attempt # 1- previously engaged patient  05/01/2018  SELBY SLOVACEK 11-18-1955 [id number]  2:15 pm: Unsuccessful telephone outreach attempt to [name], 63 y/o female referred to [place] by [facility] Pharmacist for patient concerns around managing health care in setting of multiple chronic conditions,as well as need for assistance with determination of medicaid eligibility. Patient has had no recent ED or hospital visits. Patient has history including, but not limited toHTN/ HLD; DM-Type II; CAD with NSTEMI (2014); sCHF; CKD/ ESRD on HD M-W-Fri; anemia; depression; and GERD.   HIPAA compliant voice mail message left for patient, requesting return call back.  Plan:  Will re-attempt [place] telephone outreach within 4 business days if I do not hear back from patient first.  Oneta Rack, RN, BSN, [place] Coordinator [facility] Care Management  [phone]

## 2018-05-02 DIAGNOSIS — E1122 Type 2 diabetes mellitus with diabetic chronic kidney disease: Secondary | ICD-10-CM | POA: Diagnosis not present

## 2018-05-02 DIAGNOSIS — N2581 Secondary hyperparathyroidism of renal origin: Secondary | ICD-10-CM | POA: Diagnosis not present

## 2018-05-02 DIAGNOSIS — N186 End stage renal disease: Secondary | ICD-10-CM | POA: Diagnosis not present

## 2018-05-02 DIAGNOSIS — D631 Anemia in chronic kidney disease: Secondary | ICD-10-CM | POA: Diagnosis not present

## 2018-05-03 DIAGNOSIS — N186 End stage renal disease: Secondary | ICD-10-CM | POA: Diagnosis not present

## 2018-05-03 DIAGNOSIS — Z992 Dependence on renal dialysis: Secondary | ICD-10-CM | POA: Diagnosis not present

## 2018-05-05 ENCOUNTER — Other Ambulatory Visit: Payer: Self-pay | Admitting: *Deleted

## 2018-05-05 DIAGNOSIS — D509 Iron deficiency anemia, unspecified: Secondary | ICD-10-CM | POA: Diagnosis not present

## 2018-05-05 DIAGNOSIS — N186 End stage renal disease: Secondary | ICD-10-CM | POA: Diagnosis not present

## 2018-05-05 DIAGNOSIS — N2581 Secondary hyperparathyroidism of renal origin: Secondary | ICD-10-CM | POA: Diagnosis not present

## 2018-05-05 DIAGNOSIS — E1122 Type 2 diabetes mellitus with diabetic chronic kidney disease: Secondary | ICD-10-CM | POA: Diagnosis not present

## 2018-05-05 NOTE — Telephone Encounter (Signed)
Patient husband calling to check on status of Patient Assistance Please call to discuss

## 2018-05-05 NOTE — Telephone Encounter (Signed)
Patient's husband has not heard from [place] Patient Assistance yet. Let him know that someone should call him by today and if not, he could call them to verify with them by calling [phone]. He was appreciative. He stated he was going to give them today and then call. He will let us know if any new questions or concerns arise.

## 2018-05-05 NOTE — Telephone Encounter (Signed)
lmovm Awaiting for pt to contact office to verify if she would like 30 day or 90 day refill for Midodrine 10 mg tablet.

## 2018-05-06 ENCOUNTER — Other Ambulatory Visit: Payer: Self-pay | Admitting: *Deleted

## 2018-05-06 ENCOUNTER — Other Ambulatory Visit: Payer: Self-pay | Admitting: Family Medicine

## 2018-05-06 ENCOUNTER — Encounter: Payer: Self-pay | Admitting: *Deleted

## 2018-05-06 MED ORDER — MIDODRINE HCL 10 MG PO TABS: ORAL_TABLET | ORAL | 3 refills | Status: DC

## 2018-05-06 NOTE — Patient Outreach (Signed)
[facility]) Care Management [place] Telephone Outreach Unsuccessful consecutive outreach attempt # 2- previously engaged patient  05/06/2018  [name] 29-Mar-1955 [id number]  10:40 am:  Unsuccessful telephone outreach attempt to Jamie Arias, 63 y/o female referred to [place] by [facility] Pharmacist for patient concerns around managing health care in setting of multiple chronic conditions,as well as need for assistance with determination of medicaid eligibility. Patient has had no recent ED or hospital visits. Patient has history including, but not limited toHTN/ HLD; DM-Type II; CAD with NSTEMI (2014); sCHF; CKD/ ESRD on HD M-W-Fri; anemia; depression; and GERD.  HIPAA compliant voice mail message left for patient, requesting return call back.  Plan:  Will place [facility] CM unsuccessful patient outreach letter in mail requesting call back in writing  Will re-attempt [place] telephone outreach later this week if I do not hear back from patient first.  Oneta Rack, RN, BSN, [place] Coordinator [facility] Care Management  [phone]

## 2018-05-06 NOTE — Telephone Encounter (Signed)
Requested Prescriptions   Signed Prescriptions Disp Refills  . midodrine (PROAMATINE) 10 MG tablet 72 tablet 3    Sig: Take one tablet by mouth twice a day on Monday, Wednesday, Friday    Authorizing Provider: Kathlyn [place] A    Ordering User: Britt Bottom

## 2018-05-07 DIAGNOSIS — N186 End stage renal disease: Secondary | ICD-10-CM | POA: Diagnosis not present

## 2018-05-07 DIAGNOSIS — D509 Iron deficiency anemia, unspecified: Secondary | ICD-10-CM | POA: Diagnosis not present

## 2018-05-07 DIAGNOSIS — N2581 Secondary hyperparathyroidism of renal origin: Secondary | ICD-10-CM | POA: Diagnosis not present

## 2018-05-07 DIAGNOSIS — E1122 Type 2 diabetes mellitus with diabetic chronic kidney disease: Secondary | ICD-10-CM | POA: Diagnosis not present

## 2018-05-08 ENCOUNTER — Other Ambulatory Visit: Payer: Self-pay | Admitting: *Deleted

## 2018-05-08 ENCOUNTER — Encounter: Payer: Self-pay | Admitting: *Deleted

## 2018-05-08 NOTE — Patient Outreach (Signed)
[facility]) Care Management [place] Telephone Outreach Unsuccessful consecutive outreach attempt # 3 without patient call-back  05/08/2018  [name] May 30, 1955 [id number]  09:00 am:  Unsuccessful telephone outreach attempt to Jamie Arias, 63 y/o female referred to [place] by [facility] Pharmacist for patient concerns around managing health care in setting of multiple chronic conditions,as well as need for assistance with determination of medicaid eligibility. Patient has had no recent ED or hospital visits. Patient has history including, but not limited toHTN/ HLD; DM-Type II; CAD with NSTEMI (2014); sCHF; CKD/ ESRD on HD M-W-Fri; anemia; depression; and GERD.   HIPAA compliant voice mail message left for patient, requesting return call back.  Plan:  Verified [place] unsuccessful patient outreach letter placed in mail requesting call back in writing on 05/06/2018  Will move to [place] case closure if I do not hear back from patient within 10 business days of initial unsuccessful attempt (05/01/2018)  Oneta Rack, RN, BSN, [place] Coordinator [facility] Care Management  [phone]

## 2018-05-09 DIAGNOSIS — E1122 Type 2 diabetes mellitus with diabetic chronic kidney disease: Secondary | ICD-10-CM | POA: Diagnosis not present

## 2018-05-09 DIAGNOSIS — N186 End stage renal disease: Secondary | ICD-10-CM | POA: Diagnosis not present

## 2018-05-09 DIAGNOSIS — D509 Iron deficiency anemia, unspecified: Secondary | ICD-10-CM | POA: Diagnosis not present

## 2018-05-09 DIAGNOSIS — N2581 Secondary hyperparathyroidism of renal origin: Secondary | ICD-10-CM | POA: Diagnosis not present

## 2018-05-12 DIAGNOSIS — N186 End stage renal disease: Secondary | ICD-10-CM | POA: Diagnosis not present

## 2018-05-12 DIAGNOSIS — N2581 Secondary hyperparathyroidism of renal origin: Secondary | ICD-10-CM | POA: Diagnosis not present

## 2018-05-12 DIAGNOSIS — E1122 Type 2 diabetes mellitus with diabetic chronic kidney disease: Secondary | ICD-10-CM | POA: Diagnosis not present

## 2018-05-12 DIAGNOSIS — D509 Iron deficiency anemia, unspecified: Secondary | ICD-10-CM | POA: Diagnosis not present

## 2018-05-14 ENCOUNTER — Other Ambulatory Visit: Payer: Self-pay | Admitting: Family Medicine

## 2018-05-14 ENCOUNTER — Other Ambulatory Visit: Payer: Self-pay | Admitting: *Deleted

## 2018-05-14 DIAGNOSIS — N2581 Secondary hyperparathyroidism of renal origin: Secondary | ICD-10-CM | POA: Diagnosis not present

## 2018-05-14 DIAGNOSIS — E1122 Type 2 diabetes mellitus with diabetic chronic kidney disease: Secondary | ICD-10-CM | POA: Diagnosis not present

## 2018-05-14 DIAGNOSIS — N186 End stage renal disease: Secondary | ICD-10-CM | POA: Diagnosis not present

## 2018-05-14 DIAGNOSIS — D509 Iron deficiency anemia, unspecified: Secondary | ICD-10-CM | POA: Diagnosis not present

## 2018-05-14 NOTE — Patient Outreach (Signed)
[place] [facility]) Care Management [facility] CM Case Closure- unable to maintain contact with patient  05/14/2018  LAINI URICK May 12, 1955 [id number]  [place] CM Case Closure note re:  [name], 63 y/o female referred to [place] by [facility] Pharmacist for patient concerns around managing health care in setting of multiple chronic conditions,as well as need for assistance with determination of medicaid eligibility. Patient has had no recent ED or hospital visits. Patient has history including, but not limited toHTN/ HLD; DM-Type II; CAD with NSTEMI (2014); sCHF; CKD/ ESRD on HD M-W-Fri; anemia; depression; and GERD.   Verified [place] unsuccessful patient outreach letter placed in mail requesting call back in writing on 05/06/2018; have not received response back from patient after 3 consecutive telephone outreach attempts within 10 business days.  Plan:  Will close [place] patient case and will make patient's PCP aware of same  Oneta Rack, RN, BSN, [place] Care Management  [phone]

## 2018-05-16 DIAGNOSIS — D509 Iron deficiency anemia, unspecified: Secondary | ICD-10-CM | POA: Diagnosis not present

## 2018-05-16 DIAGNOSIS — N2581 Secondary hyperparathyroidism of renal origin: Secondary | ICD-10-CM | POA: Diagnosis not present

## 2018-05-16 DIAGNOSIS — E1122 Type 2 diabetes mellitus with diabetic chronic kidney disease: Secondary | ICD-10-CM | POA: Diagnosis not present

## 2018-05-16 DIAGNOSIS — N186 End stage renal disease: Secondary | ICD-10-CM | POA: Diagnosis not present

## 2018-05-19 ENCOUNTER — Telehealth (INDEPENDENT_AMBULATORY_CARE_PROVIDER_SITE_OTHER): Payer: Self-pay

## 2018-05-19 ENCOUNTER — Other Ambulatory Visit (INDEPENDENT_AMBULATORY_CARE_PROVIDER_SITE_OTHER): Payer: Self-pay | Admitting: Nurse Practitioner

## 2018-05-19 DIAGNOSIS — E1122 Type 2 diabetes mellitus with diabetic chronic kidney disease: Secondary | ICD-10-CM | POA: Diagnosis not present

## 2018-05-19 DIAGNOSIS — N2581 Secondary hyperparathyroidism of renal origin: Secondary | ICD-10-CM | POA: Diagnosis not present

## 2018-05-19 DIAGNOSIS — N186 End stage renal disease: Secondary | ICD-10-CM | POA: Diagnosis not present

## 2018-05-19 DIAGNOSIS — D509 Iron deficiency anemia, unspecified: Secondary | ICD-10-CM | POA: Diagnosis not present

## 2018-05-19 MED ORDER — CEFAZOLIN SODIUM-DEXTROSE 1-4 GM/50ML-% IV SOLN: 1.0000 g | Freq: Once | INTRAVENOUS | Status: DC

## 2018-05-19 NOTE — Telephone Encounter (Signed)
Can you see about getting her on the schedule for a declot? Thanks

## 2018-05-20 ENCOUNTER — Encounter: Payer: Self-pay | Admitting: *Deleted

## 2018-05-20 ENCOUNTER — Other Ambulatory Visit (INDEPENDENT_AMBULATORY_CARE_PROVIDER_SITE_OTHER): Payer: Self-pay | Admitting: Vascular Surgery

## 2018-05-20 ENCOUNTER — Encounter
Admission: RE | Disposition: A | Discharge status: Home / Self Care | Payer: Self-pay | Source: Home / Self Care | Attending: Vascular Surgery

## 2018-05-20 ENCOUNTER — Ambulatory Visit
Admission: RE | Admit: 2018-05-20 | Discharge: 2018-05-20 | Disposition: A | Discharge status: Home / Self Care | Payer: Medicare Other | Attending: Vascular Surgery | Admitting: Vascular Surgery

## 2018-05-20 ENCOUNTER — Other Ambulatory Visit: Payer: Self-pay | Admitting: Family Medicine

## 2018-05-20 ENCOUNTER — Other Ambulatory Visit: Payer: Self-pay

## 2018-05-20 DIAGNOSIS — E1122 Type 2 diabetes mellitus with diabetic chronic kidney disease: Secondary | ICD-10-CM | POA: Diagnosis not present

## 2018-05-20 DIAGNOSIS — I272 Pulmonary hypertension, unspecified: Secondary | ICD-10-CM | POA: Insufficient documentation

## 2018-05-20 DIAGNOSIS — K219 Gastro-esophageal reflux disease without esophagitis: Secondary | ICD-10-CM | POA: Diagnosis not present

## 2018-05-20 DIAGNOSIS — E039 Hypothyroidism, unspecified: Secondary | ICD-10-CM | POA: Diagnosis not present

## 2018-05-20 DIAGNOSIS — Y841 Kidney dialysis as the cause of abnormal reaction of the patient, or of later complication, without mention of misadventure at the time of the procedure: Secondary | ICD-10-CM | POA: Diagnosis not present

## 2018-05-20 DIAGNOSIS — Z886 Allergy status to analgesic agent status: Secondary | ICD-10-CM | POA: Diagnosis not present

## 2018-05-20 DIAGNOSIS — I2510 Atherosclerotic heart disease of native coronary artery without angina pectoris: Secondary | ICD-10-CM | POA: Diagnosis not present

## 2018-05-20 DIAGNOSIS — E785 Hyperlipidemia, unspecified: Secondary | ICD-10-CM | POA: Diagnosis not present

## 2018-05-20 DIAGNOSIS — T82868A Thrombosis of vascular prosthetic devices, implants and grafts, initial encounter: Secondary | ICD-10-CM | POA: Diagnosis not present

## 2018-05-20 DIAGNOSIS — E1140 Type 2 diabetes mellitus with diabetic neuropathy, unspecified: Secondary | ICD-10-CM | POA: Insufficient documentation

## 2018-05-20 DIAGNOSIS — Z8249 Family history of ischemic heart disease and other diseases of the circulatory system: Secondary | ICD-10-CM | POA: Diagnosis not present

## 2018-05-20 DIAGNOSIS — Z79890 Hormone replacement therapy (postmenopausal): Secondary | ICD-10-CM | POA: Diagnosis not present

## 2018-05-20 DIAGNOSIS — I132 Hypertensive heart and chronic kidney disease with heart failure and with stage 5 chronic kidney disease, or end stage renal disease: Secondary | ICD-10-CM | POA: Insufficient documentation

## 2018-05-20 DIAGNOSIS — Z881 Allergy status to other antibiotic agents status: Secondary | ICD-10-CM | POA: Diagnosis not present

## 2018-05-20 DIAGNOSIS — I252 Old myocardial infarction: Secondary | ICD-10-CM | POA: Diagnosis not present

## 2018-05-20 DIAGNOSIS — Z951 Presence of aortocoronary bypass graft: Secondary | ICD-10-CM | POA: Diagnosis not present

## 2018-05-20 DIAGNOSIS — N186 End stage renal disease: Secondary | ICD-10-CM | POA: Diagnosis not present

## 2018-05-20 DIAGNOSIS — Z79899 Other long term (current) drug therapy: Secondary | ICD-10-CM | POA: Insufficient documentation

## 2018-05-20 DIAGNOSIS — Z7982 Long term (current) use of aspirin: Secondary | ICD-10-CM | POA: Diagnosis not present

## 2018-05-20 DIAGNOSIS — Z992 Dependence on renal dialysis: Secondary | ICD-10-CM | POA: Diagnosis not present

## 2018-05-20 DIAGNOSIS — I120 Hypertensive chronic kidney disease with stage 5 chronic kidney disease or end stage renal disease: Secondary | ICD-10-CM | POA: Diagnosis not present

## 2018-05-20 DIAGNOSIS — Z794 Long term (current) use of insulin: Secondary | ICD-10-CM | POA: Diagnosis not present

## 2018-05-20 DIAGNOSIS — I255 Ischemic cardiomyopathy: Secondary | ICD-10-CM | POA: Diagnosis not present

## 2018-05-20 DIAGNOSIS — I5032 Chronic diastolic (congestive) heart failure: Secondary | ICD-10-CM | POA: Diagnosis not present

## 2018-05-20 HISTORY — PX: PERIPHERAL VASCULAR THROMBECTOMY: CATH118306

## 2018-05-20 LAB — GLUCOSE, CAPILLARY
Glucose-Capillary: 178 mg/dL — ABNORMAL HIGH (ref 70–99)
Glucose-Capillary: 150 mg/dL — ABNORMAL HIGH (ref 70–99)
Glucose-Capillary: 105 mg/dL — ABNORMAL HIGH (ref 70–99)

## 2018-05-20 LAB — POTASSIUM (ARMC VASCULAR LAB ONLY): Potassium (ARMC vascular lab): 5.4 — ABNORMAL HIGH (ref 3.5–5.1)

## 2018-05-20 LAB — POTASSIUM: Potassium: 5.2 mmol/L — ABNORMAL HIGH (ref 3.5–5.1)

## 2018-05-20 SURGERY — PERIPHERAL VASCULAR THROMBECTOMY
Anesthesia: Moderate Sedation | Laterality: Right

## 2018-05-20 MED ORDER — HYDROMORPHONE HCL 1 MG/ML IJ SOLN: 1.0000 mg | Freq: Once | INTRAMUSCULAR | Status: DC | PRN

## 2018-05-20 MED ORDER — SODIUM CHLORIDE 0.9 % IV SOLN: INTRAVENOUS | Status: DC

## 2018-05-20 MED ORDER — HEPARIN SODIUM (PORCINE) 1000 UNIT/ML IJ SOLN
INTRAMUSCULAR | Status: DC | PRN
  Administered 2018-05-20: 3000 [IU] via INTRAVENOUS

## 2018-05-20 MED ORDER — DIPHENHYDRAMINE HCL 50 MG/ML IJ SOLN: 50.0000 mg | Freq: Once | INTRAMUSCULAR | Status: DC | PRN

## 2018-05-20 MED ORDER — SODIUM CHLORIDE 0.9 % IV SOLN
INTRAVENOUS | Status: DC
  Administered 2018-05-20: 16:00:00 via INTRAVENOUS

## 2018-05-20 MED ORDER — FAMOTIDINE 20 MG PO TABS: 40.0000 mg | ORAL_TABLET | Freq: Once | ORAL | Status: DC | PRN

## 2018-05-20 MED ORDER — METHYLPREDNISOLONE SODIUM SUCC 125 MG IJ SOLR: 125.0000 mg | Freq: Once | INTRAMUSCULAR | Status: DC | PRN

## 2018-05-20 MED ORDER — ONDANSETRON HCL 4 MG/2ML IJ SOLN: 4.0000 mg | Freq: Four times a day (QID) | INTRAMUSCULAR | Status: DC | PRN

## 2018-05-20 MED ORDER — FENTANYL CITRATE (PF) 100 MCG/2ML IJ SOLN
INTRAMUSCULAR | Status: AC
  Filled 2018-05-20: qty 2

## 2018-05-20 MED ORDER — HEPARIN SODIUM (PORCINE) 1000 UNIT/ML IJ SOLN
INTRAMUSCULAR | Status: AC
  Filled 2018-05-20: qty 1

## 2018-05-20 MED ORDER — FENTANYL CITRATE (PF) 100 MCG/2ML IJ SOLN
INTRAMUSCULAR | Status: DC | PRN
  Administered 2018-05-20: 50 ug via INTRAVENOUS

## 2018-05-20 MED ORDER — MIDAZOLAM HCL 2 MG/ML PO SYRP: 8.0000 mg | ORAL_SOLUTION | Freq: Once | ORAL | Status: DC | PRN

## 2018-05-20 MED ORDER — IOHEXOL 300 MG/ML  SOLN
INTRAMUSCULAR | Status: DC | PRN
  Administered 2018-05-20: 80 mL via INTRAVENOUS

## 2018-05-20 MED ORDER — CEFAZOLIN SODIUM-DEXTROSE 1-4 GM/50ML-% IV SOLN
1.0000 g | Freq: Once | INTRAVENOUS | Status: AC
  Administered 2018-05-20: 1 g via INTRAVENOUS

## 2018-05-20 MED ORDER — MIDAZOLAM HCL 5 MG/5ML IJ SOLN
INTRAMUSCULAR | Status: AC
  Filled 2018-05-20: qty 5

## 2018-05-20 MED ORDER — MIDAZOLAM HCL 2 MG/2ML IJ SOLN
INTRAMUSCULAR | Status: DC | PRN
  Administered 2018-05-20: 2 mg via INTRAVENOUS

## 2018-05-20 SURGICAL SUPPLY — 22 items
BALLN DORADO 8X100X80 (BALLOONS) ×3
BALLN DORADO 8X60X80 (BALLOONS) ×3
BALLOON DORADO 8X100X80 (BALLOONS) ×1 IMPLANT
BALLOON DORADO 8X60X80 (BALLOONS) ×1 IMPLANT
CATH BEACON 5 .035 65 KMP TIP (CATHETERS) ×3 IMPLANT
DEVICE PRESTO INFLATION (MISCELLANEOUS) ×3 IMPLANT
DEVICE TORQUE .025-.038 (MISCELLANEOUS) ×3 IMPLANT
DRAPE BRACHIAL (DRAPES) ×3 IMPLANT
GUIDEWIRE ANGLED .035 180CM (WIRE) ×3 IMPLANT
KIT THROMB PERC PTD (MISCELLANEOUS) ×3 IMPLANT
NEEDLE ENTRY 21GA 7CM ECHOTIP (NEEDLE) ×3 IMPLANT
PACK ANGIOGRAPHY (CUSTOM PROCEDURE TRAY) ×3 IMPLANT
SET INTRO CAPELLA COAXIAL (SET/KITS/TRAYS/PACK) ×3 IMPLANT
SHEATH BRITE TIP 6FRX5.5 (SHEATH) ×6 IMPLANT
SHEATH BRITE TIP 7FRX5.5 (SHEATH) ×6 IMPLANT
STENT COVERA FLARED 8X80X80 (Permanent Stent) ×3 IMPLANT
STENT VIABAHN 8X100X120 (Permanent Stent) ×2 IMPLANT
STENT VIABAHN 8X10X120 (Permanent Stent) ×1 IMPLANT
SUT MNCRL AB 4-0 PS2 18 (SUTURE) ×3 IMPLANT
TOWEL OR 17X26 4PK STRL BLUE (TOWEL DISPOSABLE) ×3 IMPLANT
WIRE G V18X300CM (WIRE) ×6 IMPLANT
WIRE MAGIC TOR.035 180C (WIRE) ×3 IMPLANT

## 2018-05-20 NOTE — Discharge Instructions (Signed)
Dialysis Fistulogram ° °A dialysis fistulogram is an X-ray test to look inside the site where blood is removed and returned to your body during dialysis. °Fistulas and grafts provide easy and effective access for dialysis. However, sometimes problems can occur. The fistula or graft can become clogged or narrow. This can make dialysis less effective. You may need to have a fistulogram to check for these problems. If a problem is found, your health care provider can do treatments during the procedure to clear the blocked or narrowed areas. Treatments to open a blocked dialysis fistula or graft may include: °· Removing the clot from the fistula or graft with a device (thrombectomy). °· A procedure to open or widen the blocked area with a balloon (angioplasty). °· Placing a small mesh tube (stent) to keep the fistula or graft open. °· Injecting a medicine to dissolve the clot (thrombolysis). °Tell a health care provider about: °· Any allergies you have, including any reactions you have had to contrast dye. °· All medicines you are taking, including vitamins, herbs, eye drops, creams, and over-the-counter medicines. °· Any problems you or family members have had with anesthetic medicines. °· Any blood disorders you have. °· Any surgeries you have had. °· Any medical conditions you have. °· Whether you are pregnant or may be pregnant. °· Any pain, redness, or swelling you have in your limb that has the dialysis fistula or graft. °· Any bleeding from your dialysis fistula or graft. °· Any of the following in the part of your body where the dialysis fistula or graft leads: °? Numbness. °? Tingling. °? A cold feeling. °? Areas that are bluish or pale white in color. °What are the risks? °Generally, this is a safe procedure. However, problems may occur, including: °· Infection. °· Bleeding. °· Allergic reactions to medicines or dyes. °· Damage to other structures, such as nearby nerves or blood vessels. °· A blood clot in the  dialysis fistula or graft. °· A blood clot that travels to your lungs (pulmonary embolism). °What happens before the procedure? °General instructions °· Follow instructions from your health care provider about eating and drinking restrictions. °· Ask your health care provider about: °? Changing or stopping your regular medicines. This is especially important if you are taking diabetes medicines or blood thinners (anticoagulants). °? Taking medicines such as aspirin and ibuprofen. These medicines can thin your blood. Do not take these medicines unless your health care provider tells you to take them. °? Taking over-the-counter medicines, vitamins, herbs, and supplements. °· Plan to have someone take you home from the hospital or clinic. °· Plan to have a responsible adult care for you for at least 24 hours after you leave the hospital or clinic. This is important. °· You may have a blood or urine sample taken. These will help your health care provider learn how well your kidneys and liver are working and how well your blood clots. °What happens during the procedure? °· An IV will be inserted into one of your veins. °· You will be given one or more of the following: °? A medicine to help you relax (sedative). °? A medicine to numb the area (local anesthetic) on the limb with your fistula or graft. °· A needle will be inserted into your vein. °· A small, thin tube (catheter) will be inserted into the needle and guided to the area of possible problems. °· Dye (contrast material) will be injected into the catheter. As the contrast material passes through   your body, you may feel warm. °· X-rays will be taken. The contrast material will show where any narrowing or blockages are. °· If narrowing or a blockage is seen, the health care provider may do one of the following to open the blockage: °? Thrombectomy. The health care provider will use a mechanical device at the end of the catheter to break up the  clot. °? Angioplasty. The health care provider will advance a guide wire and balloon-tipped catheter to the blockage site. The balloon will be inflated for a short period of time. A stent may also be placed to keep the blocked area open. °? Thrombolysis. The health care provider will deliver a clot-dissolving medicine through the catheter. °· When the procedure is complete, the catheter will be removed. °· In some cases, the catheter site will be closed with stitches (sutures). °· Pressure will be applied to stop any bleeding, and your skin will be covered with a bandage (dressing). °The procedure may vary among health care providers and hospitals. °What happens after the procedure? °· Your blood pressure, heart rate, breathing rate, and blood oxygen level will be monitored until you leave the hospital or clinic. Your health care provider will also monitor your access site. °· You will need to stay in bed for several hours. °· Do not drive for 24 hours if you were given a sedative during your procedure. °Summary °· A dialysis fistulogram is an X-ray test to look inside the site where blood is removed and returned to your body during dialysis. °· If a problem is found, your health care provider can also do treatments during the procedure to clear the blocked or narrowed areas. °This information is not intended to replace advice given to you by your health care provider. Make sure you discuss any questions you have with your health care provider. °Document Released: 07/06/2013 Document Revised: 03/22/2017 Document Reviewed: 03/22/2017 °Elsevier Interactive Patient Education © 2019 [employer] Inc. ° °

## 2018-05-20 NOTE — Telephone Encounter (Signed)
Patient is scheduled for today.  

## 2018-05-20 NOTE — Op Note (Signed)
OPERATIVE NOTE   PROCEDURE: 1. Contrast injection right arm brachial axillary AV graft 2. Mechanical thrombectomy right arm brachial axillary AV graft with the teratology device 3. Percutaneous transluminal angioplasty and stent placement midportion of the AV graft as well as the venous outflow (peripheral segment only)  PRE-OPERATIVE DIAGNOSIS: Complication of dialysis access                                                       End Stage Renal Disease  POST-OPERATIVE DIAGNOSIS: same as above   SURGEON: Katha Cabal, M.D.  ANESTHESIA: Conscious sedation was administered by the radiology RN under my direct supervision. IV Versed plus fentanyl were utilized. Continuous ECG, pulse oximetry and blood pressure was monitored throughout the entire procedure. Conscious sedation was for a total of 50 minutes.    ESTIMATED BLOOD LOSS: minimal  FINDING(S): 1. Thrombus within the graft.  High-grade greater than 90% venous stenosis just proximal to the previously placed outflow stent diffuse greater than 90% stenosis throughout the AV graft extending from the previously placed 2 stents  SPECIMEN(S):  None  CONTRAST: 80 cc  FLUOROSCOPY TIME: 13.3 minutes  INDICATIONS: Jamie Arias is a 63 y.o. female who  presents with thrombosed right arm AV access.  The patient is scheduled for angiography with possible intervention of the AV access.  The patient is aware the risks include but are not limited to: bleeding, infection, thrombosis of the cannulated access, and possible anaphylactic reaction to the contrast.  The patient acknowledges if the access can not be salvaged a tunneled catheter will be needed and will be placed during this procedure.  The patient is aware of the risks of the procedure and elects to proceed with the angiogram and intervention.  DESCRIPTION: After full informed written consent was obtained, the patient was brought back to the Special Procedure suite and placed supine  position.  Appropriate cardiopulmonary monitors were placed.  The right arm was prepped and draped in the standard fashion.  Appropriate timeout is called. The right brachial axillary graft was cannulated with a micropuncture needle using ultrasound guidance.  With the ultrasound the AV access appeared to be filled with heterogeneous material and was poorly compressible indicating thrombosis of the AV access. The puncture was made under direct ultrasound visualization and an image was recorded for the permanent record.  The microwire was advanced and the needle was exchanged for  a microsheath.  The J-wire was then advanced and a 6 Fr sheath inserted.  Hand was then performed which demonstrated thrombus within the AV access.  The central venous structures were also imaged by hand injections.  3000 units of heparin was given and allowed to circulate as well.  A Trerotola device was then advanced beginning centrally and pulling back performing.  Several passes were made through the venous portion of the graft. Follow-up imaging now demonstrates the vast majority of the clot had been treated. Therefore a retrograde sheath was inserted. This was a 6 [place] sheath and was positioned more proximally on the arm and angled in the retrograde direction. The right brachial axillary AV graft was cannulated with a micropuncture needle using ultrasound guidance.  With the ultrasound the AV access appeared to be filled with heterogeneous material and was poorly compressible indicating thrombosis of the AV access. The puncture was  made under direct ultrasound visualization and an image was recorded for the permanent record.  Subsequently a floppy Glidewire and a KMP catheter were negotiated into the arterial system hand injection contrast was then utilized to demonstrate patency of the artery as well as the location for the anastomosis. The Trerotola device was now advanced through the retrograde sheath was extended out into  the artery the basket was opened and it was slowly pulled back into the graft and then the basket was engaged. Several passes were made on the arterial portion and pulsatility of the access was reestablished. Follow-up imaging demonstrates there was now thrombus in the venous portion surrounding the sheath and this was treated with the Trerotola device from the antegrade direction. After several passes imaging demonstrated resolution of thrombus within the graft and forward flow however stricture of the graft was noted in the midportion of the graft itself as well as in the venous outflow.  These strictures were greater than 90% stenotic in both locations.  The sheaths were both upsized to 7 [place] sheaths.  Initially working at the venous outflow since this sheath access site would be covered by the pending graft stent a 8 x 80 flared Covera was deployed across the venous outflow.  It was postdilated with the 8 mm [place] balloon inflated to 20 atm.  Follow-up imaging demonstrated less than 5% residual stenosis with complete resolution of the venous stricture.  The wire was then removed.  Attention was then turned to the retrograde sheath.  Magnified imaging was performed and under roadmapping a 8 mm x 100 mm via bond stent was deployed once it was deployed the antegrade sheath was removed.  The via bond stent was then postdilated with an 8 mm [place] balloon inflated to 20 atm.  Follow-up imaging through the [place] which had been reintroduced through the retrograde sheath demonstrated less than 5% residual stenosis throughout the entire system with rapid flow of contrast.  A 4-0 Monocryl purse-string suture was sewn around both of the sheaths.  The sheaths were removed and light pressure was applied.  A sterile bandage was applied to the puncture site.    COMPLICATIONS: None  CONDITION: Improved  Katha Cabal, M.D  [facility] Office: [phone]  05/20/2018 6:43 PM

## 2018-05-20 NOTE — H&P (Signed)
[facility] Admission History & Physical  MRN : [id number]  Jamie Arias is a 63 y.o. (December 23, 1955) female who presents with chief complaint of No chief complaint on file. Marland Kitchen  History of Present Illness: I am asked to evaluate the patient by the dialysis center. The patient was sent here because yesterday they were unable to cannulate the right arm brachial axillary AV graft. Furthermore the Center states there is no thrill or bruit. The patient states this is the first dialysis run to be missed. This problem is acute in onset and has been present for approximately 2 days. The patient is unaware of any other change.  Patient denies pain or tenderness overlying the access.  There is no pain with dialysis.  The patient denies hand pain or finger pain consistent with steal syndrome.   There have several past interventions of this access.  The patient is not chronically hypotensive on dialysis.  Current Facility-Administered Medications  Medication Dose Route Frequency Provider Last Rate Last Dose  . ceFAZolin (ANCEF) IVPB 1 g/50 mL premix  1 g Intravenous Once Kris Hartmann, NP       Current Outpatient Medications  Medication Sig Dispense Refill  . ACCU-CHEK FASTCLIX LANCETS MISC Check blood glucose twice daily, diagnosis code E11.9 204 each 3  . acetaminophen (TYLENOL) 325 MG tablet Take 650 mg by mouth daily as needed for moderate pain or headache.     . Alirocumab (PRALUENT) 75 MG/ML SOAJ Inject 75 mg into the skin every 14 (fourteen) days. 2 pen 11  . aspirin EC 81 MG tablet Take 81 mg by mouth daily.    Marland Kitchen atorvastatin (LIPITOR) 80 MG tablet TAKE 1 TABLET(80 MG) BY MOUTH DAILY 90 tablet 1  . b complex-vitamin c-folic acid (NEPHRO-VITE) 0.8 MG TABS tablet Take 1 tablet by mouth at bedtime.    . blood glucose meter kit and supplies KIT Dispense based on patient and insurance preference. Use up to twice daily. (FOR ICD-10 E11.9) 1 each 0  . Blood Glucose  Monitoring Suppl (ACCU-CHEK AVIVA PLUS) w/Device KIT Use as directed to test blood sugar up to three times daily 1 kit 0  . Calcium Acetate 668 (169 Ca) MG TABS TAKE 3 TABLETS BY MOUTH THREE TIMES DAILY WITH MEALS  6  . ergocalciferol (VITAMIN D2) 50000 units capsule Take 50,000 Units by mouth once a week.     . ferrous sulfate 325 (65 FE) MG tablet Take 325 mg by mouth daily with breakfast.    . fluticasone (FLONASE) 50 MCG/ACT nasal spray Place 2 sprays into both nostrils daily. 16 g 6  . furosemide (LASIX) 20 MG tablet Take 20 mg by mouth every other day. Non-dialysis days    . gabapentin (NEURONTIN) 300 MG capsule TAKE 1 CAPSULE BY MOUTH THREE TIMES DAILY 270 capsule 0  . glucose blood (ACCU-CHEK AVIVA) test strip Use as instructed to test blood sugar up to 3 times daily 100 each 12  . Insulin Glargine (BASAGLAR KWIKPEN) 100 UNIT/ML SOPN Inject 0.25-0.35 mLs (25-35 Units total) into the skin daily. (Patient taking differently: Inject 28 Units into the skin daily. ) 15 mL 6  . Insulin Pen Needle (GLOBAL EASE INJECT PEN NEEDLES) 31G X 5 MM MISC Inject 0.1 mLs (10 Units total) into the skin at bedtime 90 each 2  . Lancets (ACCU-CHEK SOFT TOUCH) lancets Use as instructed 100 each 12  . Lancets Misc. (ACCU-CHEK SOFTCLIX LANCET DEV) KIT Use as directed to test  blood sugar up to three times daily 1 kit 0  . levothyroxine (SYNTHROID, LEVOTHROID) 75 MCG tablet TAKE 1 TABLET(75 MCG) BY MOUTH DAILY 90 tablet 0  . lidocaine-prilocaine (EMLA) cream Apply 1 application topically as needed (port access).    . midodrine (PROAMATINE) 10 MG tablet Take one tablet by mouth twice a day on Monday, Wednesday, Friday 72 tablet 3  . midodrine (PROAMATINE) 5 MG tablet Take 1 tablet by mouth Twice a day Tuesday, Thursday, Saturday, Sunday. 48 tablet 0  . nitroGLYCERIN (NITROSTAT) 0.4 MG SL tablet DISSOLVE ONE TABLET UNDER THE TONGUE EVERY 5 MINUTES AS NEEDED FOR CHEST PAIN.  DO NOT EXCEED A TOTAL OF 3 DOSES IN 15 MINUTES  (Patient not taking: Reported on 03/18/2018) 25 tablet 0  . omeprazole (PRILOSEC) 20 MG capsule Take 20 mg by mouth 2 (two) times daily before a meal.    . ondansetron (ZOFRAN-ODT) 4 MG disintegrating tablet Take 4 mg by mouth every 8 (eight) hours as needed for nausea or vomiting.     . traZODone (DESYREL) 50 MG tablet TAKE 1 TABLET BY MOUTH EVERY NIGHT AT BEDTIME (Patient taking differently: Take 50 mg by mouth at bedtime. ) 90 tablet 3    Past Medical History:  Diagnosis Date  . Anemia   . Chronic diastolic CHF (congestive heart failure) ([place])    a. Due to ischemic cardiomyopathy. EF as low as 35%, improved to normal s/p CABG; b. echo 07/06/13: EF 55-60%, no RWMAs, mod TR, trivial pericardial effusion not c/w tamponade physiology;  c. 10/2015 Echo: EF 65%, Gr1 DD, triv AI, mild MR, mildly dil LA, mod TR, PASP 42mHg.  .Marland KitchenCoronary artery disease    a. NSTEMI 06/2013; b.cath: severe three-vessel CAD w/ EF 30% & mild-mod MR; c. s/p 3 vessel CABG 07/02/13 (LIMA-LAD, SVG-OM, and SVG-RPDA);  d. 10/2015 MV: no ischemia/infarct.  . Diabetes mellitus without complication ([place]   . Diabetic neuropathy ([place]   . D[facility] patient ([facility]    MWF  . ESRD (end stage renal disease) ([facility]    a. 12/2015 initiated - mwf dialysis.  .Marland KitchenGERD (gastroesophageal reflux disease)   . Hyperlipidemia   . Hypertension   . Hypothyroidism   . Myocardial infarction ([place] 2015  . Neuropathy   . Pleural effusion 2015  . Pulmonary hypertension ([place]   . Wears dentures    full lower    Past Surgical History:  Procedure Laterality Date  . A/V FISTULAGRAM Right 12/17/2016   Procedure: A/V Fistulagram;  Surgeon: DAlgernon Huxley MD;  [facility];  Service: Cardiovascular;  Laterality: Right;  . A/V FISTULAGRAM Right 01/07/2017   Procedure: A/V Fistulagram;  Surgeon: DAlgernon Huxley MD;  [facility];  Service: Cardiovascular;  Laterality: Right;  . A/V FISTULAGRAM Right 12/03/2017   Procedure:  A/V FISTULAGRAM;  Surgeon: SKatha Cabal MD;  [facility];  Service: Cardiovascular;  Laterality: Right;  . A/V SHUNT INTERVENTION N/A 02/22/2017   Procedure: A/V SHUNT INTERVENTION;  Surgeon: DAlgernon Huxley MD;  [facility];  Service: Cardiovascular;  Laterality: N/A;  . AV FISTULA PLACEMENT Right 02/03/2016   Procedure: INSERTION OF ARTERIOVENOUS (AV) GORE-TEX GRAFT ARM ( BRACH / AXILLARY );  Surgeon: GKatha Cabal MD;  Location: [facility];  Service: Vascular;  Laterality: Right;  . CARDIAC CATHETERIZATION    . CATARACT EXTRACTION Bilateral   . CHOLECYSTECTOMY N/A 12/09/2014   Procedure: LAPAROSCOPIC CHOLECYSTECTOMY;  Surgeon:  Marlyce [name], MD;  Location: [facility];  Service: General;  Laterality: N/A;  . CORONARY ARTERY BYPASS GRAFT N/A 07/02/2013   Procedure: CORONARY ARTERY BYPASS GRAFTING (CABG);  Surgeon: Ivin [name], MD;  Location: [place];  Service: Open Heart Surgery;  Laterality: N/A;  CABG x three, using left internal mammary artery and right leg greater saphenous vein harvested endoscopically  . ESOPHAGOGASTRODUODENOSCOPY (EGD) WITH PROPOFOL N/A 11/24/2015   Procedure: ESOPHAGOGASTRODUODENOSCOPY (EGD) WITH PROPOFOL;  Surgeon: [name], MD;  Location: [facility];  Service: Endoscopy;  Laterality: N/A;  Diabetic - insulin  . EYE SURGERY Bilateral    Cataract Extraction with IOL  . INTRAOPERATIVE TRANSESOPHAGEAL ECHOCARDIOGRAM N/A 07/02/2013   Procedure: INTRAOPERATIVE TRANSESOPHAGEAL ECHOCARDIOGRAM;  Surgeon: Ivin [name], MD;  Location: [facility];  Service: Open Heart Surgery;  Laterality: N/A;  . PERIPHERAL VASCULAR CATHETERIZATION Right 12/06/2015   Procedure: Dialysis/Perma Catheter Insertion;  Surgeon: Katha Cabal, MD;  Location: [facility];  Service: Cardiovascular;  Laterality: Right;  . PERIPHERAL VASCULAR THROMBECTOMY Right 09/28/2016   Procedure: Peripheral Vascular Thrombectomy;  Surgeon: Katha Cabal, MD;  Location: [facility];  Service: Cardiovascular;  Laterality: Right;  . PORTA CATH REMOVAL N/A 06/01/2016   Procedure: Glori [name] Cath Removal;  Surgeon: Katha Cabal, MD;  Location: [facility];  Service: Cardiovascular;  Laterality: N/A;  . THORACENTESIS Left 2015    Social History Social History   Tobacco Use  . Smoking status: Never Smoker  . Smokeless tobacco: Never Used  Substance Use Topics  . Alcohol use: No    Alcohol/week: 0.0 standard drinks  . Drug use: No    Family History Family History  Problem Relation Age of Onset  . COPD Mother   . Cancer Mother        Lung  . Pulmonary embolism Father   . Diabetes Father   . Diabetes Paternal Grandfather   . Heart disease Maternal Grandmother   . Colon cancer Neg Hx   . Colon polyps Neg Hx   . Esophageal cancer Neg Hx   . Pancreatic cancer Neg Hx   . Liver disease Neg Hx     No family history of bleeding or clotting disorders, autoimmune disease or porphyria  Allergies  Allergen Reactions  . Nsaids Other (See Comments)    Contraindicated due to kidney disease.  Marland Kitchen Doxycycline Other (See Comments)    tremor     REVIEW OF SYSTEMS (Negative unless checked)  Constitutional: '[]'$ Weight loss  '[]'$ Fever  '[]'$ Chills Cardiac: '[]'$ Chest pain   '[]'$ Chest pressure   '[]'$ Palpitations   '[]'$ Shortness of breath when laying flat   '[]'$ Shortness of breath at rest   '[x]'$ Shortness of breath with exertion. Vascular:  '[]'$ Pain in legs with walking   '[]'$ Pain in legs at rest   '[]'$ Pain in legs when laying flat   '[]'$ Claudication   '[]'$ Pain in feet when walking  '[]'$ Pain in feet at rest  '[]'$ Pain in feet when laying flat   '[]'$ History of DVT   '[]'$ Phlebitis   '[]'$ Swelling in legs   '[]'$ Varicose veins   '[]'$ Non-healing ulcers Pulmonary:   '[]'$ Uses home oxygen   '[]'$ Productive cough   '[]'$ Hemoptysis   '[]'$ Wheeze  '[]'$ COPD   '[]'$ Asthma Neurologic:  '[]'$ Dizziness  '[]'$ Blackouts   '[]'$ Seizures   '[]'$ History of stroke   '[]'$ History of TIA  '[]'$ Aphasia   '[]'$ Temporary blindness    '[]'$ Dysphagia   '[]'$ Weakness or numbness in arms   '[]'$ Weakness or numbness in legs Musculoskeletal:  '[]'$ Arthritis   '[]'$   Joint swelling   '[]'$ Joint pain   '[]'$ Low back pain Hematologic:  '[]'$ Easy bruising  '[]'$ Easy bleeding   '[]'$ Hypercoagulable state   '[]'$ Anemic  '[]'$ Hepatitis Gastrointestinal:  '[]'$ Blood in stool   '[]'$ Vomiting blood  '[]'$ Gastroesophageal reflux/heartburn   '[]'$ Difficulty swallowing. Genitourinary:  '[x]'$ Chronic kidney disease   '[]'$ Difficult urination  '[]'$ Frequent urination  '[]'$ Burning with urination   '[]'$ Blood in urine Skin:  '[]'$ Rashes   '[]'$ Ulcers   '[]'$ Wounds Psychological:  '[]'$ History of anxiety   '[]'$  History of major depression.  Physical Examination  There were no vitals filed for this visit. There is no height or weight on file to calculate BMI. Gen: WD/WN, NAD Head: [place]/AT, No temporalis wasting. Prominent temp pulse not noted. Ear/Nose/Throat: Hearing grossly intact, nares w/o erythema or drainage, oropharynx w/o Erythema/Exudate,  Eyes: Conjunctiva clear, sclera non-icteric Neck: Trachea midline.  No JVD.  Pulmonary:  Good air movement, respirations not labored, no use of accessory muscles.  Cardiac: RRR, normal S1, S2. Vascular:  Right arm AV graft no thrill no bruit Vessel Right Left  Radial Palpable Palpable  Ulnar Not Palpable Not Palpable  Brachial Palpable Palpable  Carotid Palpable, without bruit Palpable, without bruit  Gastrointestinal: soft, non-tender/non-distended. No guarding/reflex.  Musculoskeletal: M/S 5/5 throughout.  Extremities without ischemic changes.  No deformity or atrophy.  Neurologic: Sensation grossly intact in extremities.  Symmetrical.  Speech is fluent. Motor exam as listed above. Psychiatric: Judgment intact, Mood & affect appropriate for pt's clinical situation. Dermatologic: No rashes or ulcers noted.  No cellulitis or open wounds. Lymph : No Cervical, Axillary, or Inguinal lymphadenopathy.   CBC Lab Results  Component Value Date   WBC 6.2 09/05/2017   HGB 12.1  09/05/2017   HCT 35.3 09/05/2017   MCV 95.5 09/05/2017   PLT 212 09/05/2017    BMET    Component Value Date/Time   NA 142 09/05/2017 0436   NA 140 03/30/2014 1611   K 4.2 09/05/2017 0436   K 4.3 03/30/2014 1611   CL 101 09/05/2017 0436   CL 105 03/30/2014 1611   CO2 27 09/05/2017 0436   CO2 29 03/30/2014 1611   GLUCOSE 84 09/05/2017 0436   GLUCOSE 187 (H) 03/30/2014 1611   BUN 47 (H) 09/05/2017 0436   BUN 21 (H) 03/30/2014 1611   CREATININE 10.14 (H) 09/05/2017 0436   CREATININE 3.14 (H) 09/09/2015 1534   CALCIUM 8.4 (L) 09/05/2017 0436   CALCIUM 9.0 03/30/2014 1611   GFRNONAA 4 (L) 09/05/2017 0436   GFRNONAA 36 (L) 03/30/2014 1611   GFRNONAA >60 07/23/2013 0741   GFRAA 4 (L) 09/05/2017 0436   GFRAA 44 (L) 03/30/2014 1611   GFRAA >60 07/23/2013 0741   CrCl cannot be calculated (Patient's most recent lab result is older than the maximum 21 days allowed.).  COAG Lab Results  Component Value Date   INR 0.99 01/30/2016   INR 1.19 07/13/2013   INR 1.27 07/12/2013    Radiology No results found.  Assessment/Plan 1.  Complication dialysis device with thrombosis AV access:  Patient's right arm AV graft is thrombosed. The patient will undergo thrombectomy using interventional techniques.  The risks and benefits were described to the patient.  All questions were answered.  The patient agrees to proceed with angiography and intervention. Potassium will be drawn to ensure that it is an appropriate level prior to performing thrombectomy. 2.  End-stage renal disease requiring hemodialysis:  Patient will continue dialysis therapy without further interruption if a successful thrombectomy is not achieved then catheter will  be placed. Dialysis has already been arranged since the patient missed their previous session 3.  Hypertension:  Patient will continue medical management; nephrology is following no changes in oral medications. 4. Diabetes mellitus:  Glucose will be monitored and  oral medications been held this morning once the patient has undergone the patient's procedure po intake will be reinitiated and again Accu-Cheks will be used to assess the blood glucose level and treat as needed. The patient will be restarted on the patient's usual hypoglycemic regime 5.  Coronary artery disease:  EKG will be monitored. Nitrates will be used if needed. The patient's oral cardiac medications will be continued.    Hortencia Pilar, MD  05/20/2018 9:51 AM

## 2018-05-20 NOTE — OR Nursing (Signed)
Due to emergency cases pt procedure will be delayed couple hours. Pt aware. She has not had dialysis since Friday and no bruie or thrill noted. Dr Delana Meyer wants [place] to check potassium level and decide after we get results.

## 2018-05-20 NOTE — Progress Notes (Deleted)
[facility] Daily Progress Note  Vascular Surgery Communication Note  Patient presented for dialysis access declot however ISAT potassium 5.4 / repeat lab was 5.2 Potassium too elevated to proceed with declot Will need admission and treatment Will plan for declot tomorrow when potassium is corrected Will pre-op  Discussed with Dr. Eber Hong Stegmayer PA-C 05/20/2018 2:57 PM

## 2018-05-21 ENCOUNTER — Telehealth (INDEPENDENT_AMBULATORY_CARE_PROVIDER_SITE_OTHER): Payer: Self-pay | Admitting: Vascular Surgery

## 2018-05-21 ENCOUNTER — Encounter: Payer: Self-pay | Admitting: Vascular Surgery

## 2018-05-21 DIAGNOSIS — E1122 Type 2 diabetes mellitus with diabetic chronic kidney disease: Secondary | ICD-10-CM | POA: Diagnosis not present

## 2018-05-21 DIAGNOSIS — N2581 Secondary hyperparathyroidism of renal origin: Secondary | ICD-10-CM | POA: Diagnosis not present

## 2018-05-21 DIAGNOSIS — N186 End stage renal disease: Secondary | ICD-10-CM | POA: Diagnosis not present

## 2018-05-21 DIAGNOSIS — D509 Iron deficiency anemia, unspecified: Secondary | ICD-10-CM | POA: Diagnosis not present

## 2018-05-23 DIAGNOSIS — N2581 Secondary hyperparathyroidism of renal origin: Secondary | ICD-10-CM | POA: Diagnosis not present

## 2018-05-23 DIAGNOSIS — E1122 Type 2 diabetes mellitus with diabetic chronic kidney disease: Secondary | ICD-10-CM | POA: Diagnosis not present

## 2018-05-23 DIAGNOSIS — D509 Iron deficiency anemia, unspecified: Secondary | ICD-10-CM | POA: Diagnosis not present

## 2018-05-23 DIAGNOSIS — N186 End stage renal disease: Secondary | ICD-10-CM | POA: Diagnosis not present

## 2018-05-26 DIAGNOSIS — N186 End stage renal disease: Secondary | ICD-10-CM | POA: Diagnosis not present

## 2018-05-26 DIAGNOSIS — E1122 Type 2 diabetes mellitus with diabetic chronic kidney disease: Secondary | ICD-10-CM | POA: Diagnosis not present

## 2018-05-26 DIAGNOSIS — D509 Iron deficiency anemia, unspecified: Secondary | ICD-10-CM | POA: Diagnosis not present

## 2018-05-26 DIAGNOSIS — N2581 Secondary hyperparathyroidism of renal origin: Secondary | ICD-10-CM | POA: Diagnosis not present

## 2018-05-27 ENCOUNTER — Ambulatory Visit: Payer: Medicare Other | Admitting: Family Medicine

## 2018-05-28 DIAGNOSIS — N186 End stage renal disease: Secondary | ICD-10-CM | POA: Diagnosis not present

## 2018-05-28 DIAGNOSIS — E1122 Type 2 diabetes mellitus with diabetic chronic kidney disease: Secondary | ICD-10-CM | POA: Diagnosis not present

## 2018-05-28 DIAGNOSIS — D509 Iron deficiency anemia, unspecified: Secondary | ICD-10-CM | POA: Diagnosis not present

## 2018-05-28 DIAGNOSIS — N2581 Secondary hyperparathyroidism of renal origin: Secondary | ICD-10-CM | POA: Diagnosis not present

## 2018-05-30 DIAGNOSIS — N2581 Secondary hyperparathyroidism of renal origin: Secondary | ICD-10-CM | POA: Diagnosis not present

## 2018-05-30 DIAGNOSIS — N186 End stage renal disease: Secondary | ICD-10-CM | POA: Diagnosis not present

## 2018-05-30 DIAGNOSIS — E1122 Type 2 diabetes mellitus with diabetic chronic kidney disease: Secondary | ICD-10-CM | POA: Diagnosis not present

## 2018-05-30 DIAGNOSIS — D509 Iron deficiency anemia, unspecified: Secondary | ICD-10-CM | POA: Diagnosis not present

## 2018-06-02 DIAGNOSIS — E1122 Type 2 diabetes mellitus with diabetic chronic kidney disease: Secondary | ICD-10-CM | POA: Diagnosis not present

## 2018-06-02 DIAGNOSIS — D509 Iron deficiency anemia, unspecified: Secondary | ICD-10-CM | POA: Diagnosis not present

## 2018-06-02 DIAGNOSIS — N186 End stage renal disease: Secondary | ICD-10-CM | POA: Diagnosis not present

## 2018-06-02 DIAGNOSIS — N2581 Secondary hyperparathyroidism of renal origin: Secondary | ICD-10-CM | POA: Diagnosis not present

## 2018-06-03 ENCOUNTER — Ambulatory Visit: Payer: Medicare Other | Admitting: Family Medicine

## 2018-06-03 DIAGNOSIS — Z992 Dependence on renal dialysis: Secondary | ICD-10-CM | POA: Diagnosis not present

## 2018-06-03 DIAGNOSIS — N186 End stage renal disease: Secondary | ICD-10-CM | POA: Diagnosis not present

## 2018-06-04 DIAGNOSIS — N186 End stage renal disease: Secondary | ICD-10-CM | POA: Diagnosis not present

## 2018-06-04 DIAGNOSIS — N2581 Secondary hyperparathyroidism of renal origin: Secondary | ICD-10-CM | POA: Diagnosis not present

## 2018-06-04 DIAGNOSIS — E1122 Type 2 diabetes mellitus with diabetic chronic kidney disease: Secondary | ICD-10-CM | POA: Diagnosis not present

## 2018-06-04 DIAGNOSIS — D509 Iron deficiency anemia, unspecified: Secondary | ICD-10-CM | POA: Diagnosis not present

## 2018-06-04 DIAGNOSIS — D631 Anemia in chronic kidney disease: Secondary | ICD-10-CM | POA: Diagnosis not present

## 2018-06-05 ENCOUNTER — Ambulatory Visit (INDEPENDENT_AMBULATORY_CARE_PROVIDER_SITE_OTHER): Payer: Medicare Other | Admitting: Vascular Surgery

## 2018-06-05 ENCOUNTER — Encounter (INDEPENDENT_AMBULATORY_CARE_PROVIDER_SITE_OTHER): Payer: Medicare Other

## 2018-06-06 DIAGNOSIS — D509 Iron deficiency anemia, unspecified: Secondary | ICD-10-CM | POA: Diagnosis not present

## 2018-06-06 DIAGNOSIS — E1122 Type 2 diabetes mellitus with diabetic chronic kidney disease: Secondary | ICD-10-CM | POA: Diagnosis not present

## 2018-06-06 DIAGNOSIS — N2581 Secondary hyperparathyroidism of renal origin: Secondary | ICD-10-CM | POA: Diagnosis not present

## 2018-06-06 DIAGNOSIS — D631 Anemia in chronic kidney disease: Secondary | ICD-10-CM | POA: Diagnosis not present

## 2018-06-06 DIAGNOSIS — N186 End stage renal disease: Secondary | ICD-10-CM | POA: Diagnosis not present

## 2018-06-09 DIAGNOSIS — N186 End stage renal disease: Secondary | ICD-10-CM | POA: Diagnosis not present

## 2018-06-09 DIAGNOSIS — E1122 Type 2 diabetes mellitus with diabetic chronic kidney disease: Secondary | ICD-10-CM | POA: Diagnosis not present

## 2018-06-09 DIAGNOSIS — N2581 Secondary hyperparathyroidism of renal origin: Secondary | ICD-10-CM | POA: Diagnosis not present

## 2018-06-09 DIAGNOSIS — D509 Iron deficiency anemia, unspecified: Secondary | ICD-10-CM | POA: Diagnosis not present

## 2018-06-09 DIAGNOSIS — D631 Anemia in chronic kidney disease: Secondary | ICD-10-CM | POA: Diagnosis not present

## 2018-06-11 DIAGNOSIS — E1122 Type 2 diabetes mellitus with diabetic chronic kidney disease: Secondary | ICD-10-CM | POA: Diagnosis not present

## 2018-06-11 DIAGNOSIS — N2581 Secondary hyperparathyroidism of renal origin: Secondary | ICD-10-CM | POA: Diagnosis not present

## 2018-06-11 DIAGNOSIS — D509 Iron deficiency anemia, unspecified: Secondary | ICD-10-CM | POA: Diagnosis not present

## 2018-06-11 DIAGNOSIS — D631 Anemia in chronic kidney disease: Secondary | ICD-10-CM | POA: Diagnosis not present

## 2018-06-11 DIAGNOSIS — N186 End stage renal disease: Secondary | ICD-10-CM | POA: Diagnosis not present

## 2018-06-13 DIAGNOSIS — D509 Iron deficiency anemia, unspecified: Secondary | ICD-10-CM | POA: Diagnosis not present

## 2018-06-13 DIAGNOSIS — N186 End stage renal disease: Secondary | ICD-10-CM | POA: Diagnosis not present

## 2018-06-13 DIAGNOSIS — D631 Anemia in chronic kidney disease: Secondary | ICD-10-CM | POA: Diagnosis not present

## 2018-06-13 DIAGNOSIS — N2581 Secondary hyperparathyroidism of renal origin: Secondary | ICD-10-CM | POA: Diagnosis not present

## 2018-06-13 DIAGNOSIS — E1122 Type 2 diabetes mellitus with diabetic chronic kidney disease: Secondary | ICD-10-CM | POA: Diagnosis not present

## 2018-06-16 DIAGNOSIS — N2581 Secondary hyperparathyroidism of renal origin: Secondary | ICD-10-CM | POA: Diagnosis not present

## 2018-06-16 DIAGNOSIS — D631 Anemia in chronic kidney disease: Secondary | ICD-10-CM | POA: Diagnosis not present

## 2018-06-16 DIAGNOSIS — N186 End stage renal disease: Secondary | ICD-10-CM | POA: Diagnosis not present

## 2018-06-16 DIAGNOSIS — E1122 Type 2 diabetes mellitus with diabetic chronic kidney disease: Secondary | ICD-10-CM | POA: Diagnosis not present

## 2018-06-16 DIAGNOSIS — D509 Iron deficiency anemia, unspecified: Secondary | ICD-10-CM | POA: Diagnosis not present

## 2018-06-18 ENCOUNTER — Other Ambulatory Visit: Payer: Self-pay | Admitting: Family Medicine

## 2018-06-18 DIAGNOSIS — D631 Anemia in chronic kidney disease: Secondary | ICD-10-CM | POA: Diagnosis not present

## 2018-06-18 DIAGNOSIS — N186 End stage renal disease: Secondary | ICD-10-CM | POA: Diagnosis not present

## 2018-06-18 DIAGNOSIS — N2581 Secondary hyperparathyroidism of renal origin: Secondary | ICD-10-CM | POA: Diagnosis not present

## 2018-06-18 DIAGNOSIS — D509 Iron deficiency anemia, unspecified: Secondary | ICD-10-CM | POA: Diagnosis not present

## 2018-06-18 DIAGNOSIS — E1122 Type 2 diabetes mellitus with diabetic chronic kidney disease: Secondary | ICD-10-CM | POA: Diagnosis not present

## 2018-06-20 DIAGNOSIS — D509 Iron deficiency anemia, unspecified: Secondary | ICD-10-CM | POA: Diagnosis not present

## 2018-06-20 DIAGNOSIS — N2581 Secondary hyperparathyroidism of renal origin: Secondary | ICD-10-CM | POA: Diagnosis not present

## 2018-06-20 DIAGNOSIS — E1122 Type 2 diabetes mellitus with diabetic chronic kidney disease: Secondary | ICD-10-CM | POA: Diagnosis not present

## 2018-06-20 DIAGNOSIS — D631 Anemia in chronic kidney disease: Secondary | ICD-10-CM | POA: Diagnosis not present

## 2018-06-20 DIAGNOSIS — N186 End stage renal disease: Secondary | ICD-10-CM | POA: Diagnosis not present

## 2018-06-20 NOTE — Telephone Encounter (Signed)
Last OV   Last refilled levothyroxine 03/18/2018 disp 90 with no refills   Last refilled trazodone 05/20/2018 disp 30 with no refills   Next OV none scheduled   Sent to PCP for approval

## 2018-06-21 NOTE — Telephone Encounter (Signed)
Patient needs follow-up scheduled. Short term refill sent to pharmacy.

## 2018-06-23 DIAGNOSIS — D509 Iron deficiency anemia, unspecified: Secondary | ICD-10-CM | POA: Diagnosis not present

## 2018-06-23 DIAGNOSIS — D631 Anemia in chronic kidney disease: Secondary | ICD-10-CM | POA: Diagnosis not present

## 2018-06-23 DIAGNOSIS — E1122 Type 2 diabetes mellitus with diabetic chronic kidney disease: Secondary | ICD-10-CM | POA: Diagnosis not present

## 2018-06-23 DIAGNOSIS — N2581 Secondary hyperparathyroidism of renal origin: Secondary | ICD-10-CM | POA: Diagnosis not present

## 2018-06-23 DIAGNOSIS — N186 End stage renal disease: Secondary | ICD-10-CM | POA: Diagnosis not present

## 2018-06-25 DIAGNOSIS — N2581 Secondary hyperparathyroidism of renal origin: Secondary | ICD-10-CM | POA: Diagnosis not present

## 2018-06-25 DIAGNOSIS — N186 End stage renal disease: Secondary | ICD-10-CM | POA: Diagnosis not present

## 2018-06-25 DIAGNOSIS — D509 Iron deficiency anemia, unspecified: Secondary | ICD-10-CM | POA: Diagnosis not present

## 2018-06-25 DIAGNOSIS — D631 Anemia in chronic kidney disease: Secondary | ICD-10-CM | POA: Diagnosis not present

## 2018-06-25 DIAGNOSIS — E1122 Type 2 diabetes mellitus with diabetic chronic kidney disease: Secondary | ICD-10-CM | POA: Diagnosis not present

## 2018-06-27 DIAGNOSIS — N186 End stage renal disease: Secondary | ICD-10-CM | POA: Diagnosis not present

## 2018-06-27 DIAGNOSIS — D509 Iron deficiency anemia, unspecified: Secondary | ICD-10-CM | POA: Diagnosis not present

## 2018-06-27 DIAGNOSIS — D631 Anemia in chronic kidney disease: Secondary | ICD-10-CM | POA: Diagnosis not present

## 2018-06-27 DIAGNOSIS — N2581 Secondary hyperparathyroidism of renal origin: Secondary | ICD-10-CM | POA: Diagnosis not present

## 2018-06-27 DIAGNOSIS — E1122 Type 2 diabetes mellitus with diabetic chronic kidney disease: Secondary | ICD-10-CM | POA: Diagnosis not present

## 2018-06-28 ENCOUNTER — Telehealth: Payer: Self-pay | Admitting: Family Medicine

## 2018-06-28 NOTE — Telephone Encounter (Signed)
Prescription request form from [place] received requesting refill of basaglar.  The patient has not been seen since July 2019.  Please contact her to confirm what dose of basaglar she is currently using.  Please see how much she has left.  Please additionally get her set up for a follow-up visit in the office as soon as possible.  This prescription request form is still in my red folder.

## 2018-06-30 ENCOUNTER — Encounter (INDEPENDENT_AMBULATORY_CARE_PROVIDER_SITE_OTHER): Payer: Medicare Other

## 2018-06-30 ENCOUNTER — Ambulatory Visit (INDEPENDENT_AMBULATORY_CARE_PROVIDER_SITE_OTHER): Payer: Medicare Other | Admitting: Vascular Surgery

## 2018-06-30 DIAGNOSIS — E1122 Type 2 diabetes mellitus with diabetic chronic kidney disease: Secondary | ICD-10-CM | POA: Diagnosis not present

## 2018-06-30 DIAGNOSIS — D631 Anemia in chronic kidney disease: Secondary | ICD-10-CM | POA: Diagnosis not present

## 2018-06-30 DIAGNOSIS — D509 Iron deficiency anemia, unspecified: Secondary | ICD-10-CM | POA: Diagnosis not present

## 2018-06-30 DIAGNOSIS — N2581 Secondary hyperparathyroidism of renal origin: Secondary | ICD-10-CM | POA: Diagnosis not present

## 2018-06-30 DIAGNOSIS — N186 End stage renal disease: Secondary | ICD-10-CM | POA: Diagnosis not present

## 2018-06-30 NOTE — Telephone Encounter (Addendum)
I will forward to [facility] to see if she can sign off on a refill for [employer] patient assistance for a 90 day supple to cover until she follows up with me. Thanks.

## 2018-06-30 NOTE — Telephone Encounter (Signed)
Called and spoke with pt. Pt is still using Basaglar her dose is 30 units once daily. Pt stated that she thinks she has 1-3 pens left but not 100% sure should have enough to last her until her appt with [facility] 07/08/2018.  Sent to PCP as an [place]

## 2018-07-02 DIAGNOSIS — D509 Iron deficiency anemia, unspecified: Secondary | ICD-10-CM | POA: Diagnosis not present

## 2018-07-02 DIAGNOSIS — N186 End stage renal disease: Secondary | ICD-10-CM | POA: Diagnosis not present

## 2018-07-02 DIAGNOSIS — D631 Anemia in chronic kidney disease: Secondary | ICD-10-CM | POA: Diagnosis not present

## 2018-07-02 DIAGNOSIS — N2581 Secondary hyperparathyroidism of renal origin: Secondary | ICD-10-CM | POA: Diagnosis not present

## 2018-07-02 DIAGNOSIS — E1122 Type 2 diabetes mellitus with diabetic chronic kidney disease: Secondary | ICD-10-CM | POA: Diagnosis not present

## 2018-07-02 NOTE — Telephone Encounter (Signed)
I am happy to sign Jamie Arias, would you bring it by my desk today?

## 2018-07-02 NOTE — Telephone Encounter (Signed)
Placed at your desk to sign.   Sent to covering provider

## 2018-07-03 DIAGNOSIS — N186 End stage renal disease: Secondary | ICD-10-CM | POA: Diagnosis not present

## 2018-07-03 DIAGNOSIS — Z992 Dependence on renal dialysis: Secondary | ICD-10-CM | POA: Diagnosis not present

## 2018-07-04 DIAGNOSIS — E1122 Type 2 diabetes mellitus with diabetic chronic kidney disease: Secondary | ICD-10-CM | POA: Diagnosis not present

## 2018-07-04 DIAGNOSIS — D631 Anemia in chronic kidney disease: Secondary | ICD-10-CM | POA: Diagnosis not present

## 2018-07-04 DIAGNOSIS — N2581 Secondary hyperparathyroidism of renal origin: Secondary | ICD-10-CM | POA: Diagnosis not present

## 2018-07-04 DIAGNOSIS — N186 End stage renal disease: Secondary | ICD-10-CM | POA: Diagnosis not present

## 2018-07-07 DIAGNOSIS — D631 Anemia in chronic kidney disease: Secondary | ICD-10-CM | POA: Diagnosis not present

## 2018-07-07 DIAGNOSIS — N2581 Secondary hyperparathyroidism of renal origin: Secondary | ICD-10-CM | POA: Diagnosis not present

## 2018-07-07 DIAGNOSIS — E1122 Type 2 diabetes mellitus with diabetic chronic kidney disease: Secondary | ICD-10-CM | POA: Diagnosis not present

## 2018-07-07 DIAGNOSIS — N186 End stage renal disease: Secondary | ICD-10-CM | POA: Diagnosis not present

## 2018-07-08 ENCOUNTER — Encounter: Payer: Self-pay | Admitting: Family Medicine

## 2018-07-08 ENCOUNTER — Other Ambulatory Visit: Payer: Self-pay

## 2018-07-08 ENCOUNTER — Ambulatory Visit (INDEPENDENT_AMBULATORY_CARE_PROVIDER_SITE_OTHER): Payer: Medicare Other | Admitting: Family Medicine

## 2018-07-08 DIAGNOSIS — G629 Polyneuropathy, unspecified: Secondary | ICD-10-CM | POA: Diagnosis not present

## 2018-07-08 DIAGNOSIS — E1140 Type 2 diabetes mellitus with diabetic neuropathy, unspecified: Secondary | ICD-10-CM | POA: Diagnosis not present

## 2018-07-08 DIAGNOSIS — E785 Hyperlipidemia, unspecified: Secondary | ICD-10-CM | POA: Diagnosis not present

## 2018-07-08 NOTE — Progress Notes (Signed)
Refill gabapentin and Lipitor 90 day supply

## 2018-07-08 NOTE — Progress Notes (Signed)
Virtual Visit via telephone Note  This visit type was conducted due to national recommendations for restrictions regarding the COVID-19 pandemic (e.g. social distancing).  This format is felt to be most appropriate for this patient at this time.  All issues noted in this document were discussed and addressed.  No physical exam was performed (except for noted visual exam findings with Video Visits).   I connected with Jamie Arias today at  4:00 PM EDT by a telephone and verified that I am speaking with the correct person using two identifiers. Location patient: home Location provider: work or home office Persons participating in the virtual visit: patient, provider, Jamie Arias  I discussed the limitations, risks, security and privacy concerns of performing an evaluation and management service by telephone and the availability of in person appointments. I also discussed with the patient that there may be a patient responsible charge related to this service. The patient expressed understanding and agreed to proceed.  Interactive audio and video telecommunications were attempted between this provider and patient, however failed, due to patient having technical difficulties OR patient did not have access to video capability.  We continued and completed visit with audio only.  Reason for visit: Follow-up.  HPI: Neuropathy: Taking gabapentin.  She has minimal symptoms at night.  The gabapentin does not make her drowsy.  Hyperlipidemia: Taking Lipitor and Praluent.  No chest pain or shortness of breath.  No myalgias.  No right upper quadrant pain.  Diabetes: Notes her CBGs have been running 210 in the morning and around 300 after dialysis.  She has cut back on sweets.  She has been taking basiglar 30 units daily.  No polyuria or polydipsia.  No hypoglycemia.   ROS: See pertinent positives and negatives per HPI.  Past Medical History:  Diagnosis Date  . Anemia   . Chronic diastolic CHF  (congestive heart failure) ([place])    a. Due to ischemic cardiomyopathy. EF as low as 35%, improved to normal s/p CABG; b. echo 07/06/13: EF 55-60%, no RWMAs, mod TR, trivial pericardial effusion not c/w tamponade physiology;  c. 10/2015 Echo: EF 65%, Gr1 DD, triv AI, mild MR, mildly dil LA, mod TR, PASP 3mHg.  .Marland KitchenCoronary artery disease    a. NSTEMI 06/2013; b.cath: severe three-vessel CAD w/ EF 30% & mild-mod MR; c. s/p 3 vessel CABG 07/02/13 (LIMA-LAD, SVG-OM, and SVG-RPDA);  d. 10/2015 MV: no ischemia/infarct.  . Diabetes mellitus without complication ([place]   . Diabetic neuropathy ([facility]   . [facility] patient ([facility]    MWF  . ESRD (end stage renal disease) ([place]    a. 12/2015 initiated - mwf dialysis.  .Marland KitchenGERD (gastroesophageal reflux disease)   . Hyperlipidemia   . Hypertension   . Hypothyroidism   . Myocardial infarction ([place] 2015  . Neuropathy   . Pleural effusion 2015  . Pulmonary hypertension ([place]   . Wears dentures    full lower    Past Surgical History:  Procedure Laterality Date  . A/V FISTULAGRAM Right 12/17/2016   Procedure: A/V Fistulagram;  Surgeon: DAlgernon Huxley MD;  [facility];  Service: Cardiovascular;  Laterality: Right;  . A/V FISTULAGRAM Right 01/07/2017   Procedure: A/V Fistulagram;  Surgeon: DAlgernon Huxley MD;  [facility];  Service: Cardiovascular;  Laterality: Right;  . A/V FISTULAGRAM Right 12/03/2017   Procedure: A/V FISTULAGRAM;  Surgeon: SKatha Cabal MD;  [facility];  Service: Cardiovascular;  Laterality:  Right;  Marland Kitchen A/V SHUNT INTERVENTION N/A 02/22/2017   Procedure: A/V SHUNT INTERVENTION;  Surgeon: Algernon Huxley, MD;  Location: [place] [facility] LAB;  Service: Cardiovascular;  Laterality: N/A;  . AV FISTULA PLACEMENT Right 02/03/2016   Procedure: INSERTION OF ARTERIOVENOUS (AV) GORE-TEX GRAFT ARM ( BRACH / AXILLARY );  Surgeon: Katha Cabal, MD;  Location: [facility] OR[facility];  Service: Vascular;  Laterality:  Right;  . CARDIAC CATHETERIZATION    . CATARACT EXTRACTION Bilateral   . CHOLECYSTECTOMY N/A 12/09/2014   Procedure: LAPAROSCOPIC CHOLECYSTECTOMY;  Surgeon: Marlyce [name], MD;  Location: [facility];  Service: General;  Laterality: N/A;  . CORONARY ARTERY BYPASS GRAFT N/A 07/02/2013   Procedure: CORONARY ARTERY BYPASS GRAFTING (CABG);  Surgeon: Ivin [name], MD;  Location: [place];  Service: Open Heart Surgery;  Laterality: N/A;  CABG x three, using left internal mammary artery and right leg greater saphenous vein harvested endoscopically  . ESOPHAGOGASTRODUODENOSCOPY (EGD) WITH PROPOFOL N/A 11/24/2015   Procedure: ESOPHAGOGASTRODUODENOSCOPY (EGD) WITH PROPOFOL;  Surgeon: [name], MD;  Location: [place];  Service: Endoscopy;  Laterality: N/A;  Diabetic - insulin  . EYE SURGERY Bilateral    Cataract Extraction with IOL  . INTRAOPERATIVE TRANSESOPHAGEAL ECHOCARDIOGRAM N/A 07/02/2013   Procedure: INTRAOPERATIVE TRANSESOPHAGEAL ECHOCARDIOGRAM;  Surgeon: Ivin [name], MD;  Location: [place];  Service: Open Heart Surgery;  Laterality: N/A;  . PERIPHERAL VASCULAR CATHETERIZATION Right 12/06/2015   Procedure: Dialysis/Perma Catheter Insertion;  Surgeon: Katha Cabal, MD;  Location: [place] CV LAB;  Service: Cardiovascular;  Laterality: Right;  . PERIPHERAL VASCULAR THROMBECTOMY Right 09/28/2016   Procedure: Peripheral Vascular Thrombectomy;  Surgeon: Katha Cabal, MD;  Location: [place] CV LAB;  Service: Cardiovascular;  Laterality: Right;  . PERIPHERAL VASCULAR THROMBECTOMY Right 05/20/2018   Procedure: PERIPHERAL VASCULAR THROMBECTOMY;  Surgeon: Katha Cabal, MD;  Location: [place] CV LAB;  Service: Cardiovascular;  Laterality: Right;  . PORTA CATH REMOVAL N/A 06/01/2016   Procedure: Glori [name] Cath Removal;  Surgeon: Katha Cabal, MD;  Location: [place] [facility];  Service: Cardiovascular;  Laterality: N/A;  . THORACENTESIS Left 2015    Family  History  Problem Relation Age of Onset  . COPD Mother   . Cancer Mother        Lung  . Pulmonary embolism Father   . Diabetes Father   . Diabetes Paternal Grandfather   . Heart disease Maternal Grandmother   . Colon cancer Neg Hx   . Colon polyps Neg Hx   . Esophageal cancer Neg Hx   . Pancreatic cancer Neg Hx   . Liver disease Neg Hx     SOCIAL HX: Non-smoker.   Current Outpatient Medications:  .  ACCU-CHEK FASTCLIX LANCETS MISC, Check blood glucose twice daily, diagnosis code E11.9, Disp: 204 each, Rfl: 3 .  acetaminophen (TYLENOL) 325 MG tablet, Take 650 mg by mouth daily as needed for moderate pain or headache. , Disp: , Rfl:  .  Alirocumab (PRALUENT) 75 MG/ML SOAJ, Inject 75 mg into the skin every 14 (fourteen) days., Disp: 2 pen, Rfl: 11 .  aspirin EC 81 MG tablet, Take 81 mg by mouth daily., Disp: , Rfl:  .  b complex-vitamin c-folic acid (NEPHRO-VITE) 0.8 MG TABS tablet, Take 1 tablet by mouth at bedtime., Disp: , Rfl:  .  blood glucose meter kit and supplies KIT, Dispense based on patient and insurance preference. Use up to twice daily. (FOR ICD-10 E11.9), Disp: 1 each, Rfl: 0 .  Blood Glucose Monitoring Suppl (ACCU-CHEK AVIVA PLUS) w/Device KIT, Use as directed to test blood sugar up to three times daily, Disp: 1 kit, Rfl: 0 .  Calcium Acetate 668 (169 Ca) MG TABS, TAKE 3 TABLETS BY MOUTH THREE TIMES DAILY WITH MEALS, Disp: , Rfl: 6 .  ergocalciferol (VITAMIN D2) 50000 units capsule, Take 50,000 Units by mouth once a week. , Disp: , Rfl:  .  ferrous sulfate 325 (65 FE) MG tablet, Take 325 mg by mouth daily with breakfast., Disp: , Rfl:  .  fluticasone (FLONASE) 50 MCG/ACT nasal spray, Place 2 sprays into both nostrils daily., Disp: 16 g, Rfl: 6 .  furosemide (LASIX) 20 MG tablet, Take 20 mg by mouth every other day. Non-dialysis days, Disp: , Rfl:  .  glucose blood (ACCU-CHEK AVIVA) test strip, Use as instructed to test blood sugar up to 3 times daily, Disp: 100 each, Rfl:  12 .  Insulin Glargine (BASAGLAR KWIKPEN) 100 UNIT/ML SOPN, Inject 0.25-0.35 mLs (25-35 Units total) into the skin daily. (Patient taking differently: Inject 28 Units into the skin daily. ), Disp: 15 mL, Rfl: 6 .  Insulin Pen Needle (GLOBAL EASE INJECT PEN NEEDLES) 31G X 5 MM MISC, Inject 0.1 mLs (10 Units total) into the skin at bedtime, Disp: 90 each, Rfl: 2 .  Lancets (ACCU-CHEK SOFT TOUCH) lancets, Use as instructed, Disp: 100 each, Rfl: 12 .  Lancets Misc. (ACCU-CHEK SOFTCLIX LANCET DEV) KIT, Use as directed to test blood sugar up to three times daily, Disp: 1 kit, Rfl: 0 .  levothyroxine (SYNTHROID) 75 MCG tablet, TAKE 1 TABLET BY MOUTH ONCE DAILY OFFICE  VISIT  NEEDED  FOR  FURTHER  REFILLS, Disp: 90 tablet, Rfl: 0 .  lidocaine-prilocaine (EMLA) cream, Apply 1 application topically as needed (port access)., Disp: , Rfl:  .  midodrine (PROAMATINE) 10 MG tablet, Take one tablet by mouth twice a day on Monday, Wednesday, Friday, Disp: 72 tablet, Rfl: 3 .  midodrine (PROAMATINE) 5 MG tablet, Take 1 tablet by mouth Twice a day Tuesday, Thursday, Saturday, Sunday., Disp: 48 tablet, Rfl: 0 .  nitroGLYCERIN (NITROSTAT) 0.4 MG SL tablet, DISSOLVE ONE TABLET UNDER THE TONGUE EVERY 5 MINUTES AS NEEDED FOR CHEST PAIN.  DO NOT EXCEED A TOTAL OF 3 DOSES IN 15 MINUTES, Disp: 25 tablet, Rfl: 0 .  omeprazole (PRILOSEC) 20 MG capsule, Take 20 mg by mouth 2 (two) times daily before a meal., Disp: , Rfl:  .  ondansetron (ZOFRAN-ODT) 4 MG disintegrating tablet, Take 4 mg by mouth every 8 (eight) hours as needed for nausea or vomiting. , Disp: , Rfl:  .  traZODone (DESYREL) 50 MG tablet, TAKE 1 TABLET BY MOUTH AT BEDTIME, Disp: 30 tablet, Rfl: 0 .  atorvastatin (LIPITOR) 80 MG tablet, Take 1 tablet (80 mg total) by mouth daily at 6 PM., Disp: 90 tablet, Rfl: 1 .  gabapentin (NEURONTIN) 300 MG capsule, TAKE 1 CAPSULE BY MOUTH THREE TIMES DAILY, Disp: 270 capsule, Rfl: 0  EXAM: No exam completed given this is a  telehealth telephone visit.  ASSESSMENT AND PLAN:  Discussed the following assessment and plan:  Type 2 diabetes mellitus with diabetic neuropathy, without long-term current use of insulin ([place]) - Plan: Hemoglobin A1c  Neuropathy  Hyperlipidemia, unspecified hyperlipidemia type  Diabetes mellitus, type 2 (HCC) Uncontrolled.  Will check with her clinical pharmacist regarding increasing her insulin given that she is ESRD and on dialysis.  Neuropathy Continue gabapentin.  Refill provided.  HLD (hyperlipidemia) Continue  Lipitor and Praluent.  CMA to contact patient to schedule follow-up in 3 months.  Lab work in the next month.  Social distancing precautions and sick precautions given regarding COVID-19.   I discussed the assessment and treatment plan with the patient. The patient was provided an opportunity to ask questions and all were answered. The patient agreed with the plan and demonstrated an understanding of the instructions.   The patient was advised to call back or seek an in-person evaluation if the symptoms worsen or if the condition fails to improve as anticipated.  I provided 18 minutes of non-face-to-face time during this encounter.   [name], MD

## 2018-07-09 DIAGNOSIS — E1122 Type 2 diabetes mellitus with diabetic chronic kidney disease: Secondary | ICD-10-CM | POA: Diagnosis not present

## 2018-07-09 DIAGNOSIS — N2581 Secondary hyperparathyroidism of renal origin: Secondary | ICD-10-CM | POA: Diagnosis not present

## 2018-07-09 DIAGNOSIS — N186 End stage renal disease: Secondary | ICD-10-CM | POA: Diagnosis not present

## 2018-07-09 DIAGNOSIS — D631 Anemia in chronic kidney disease: Secondary | ICD-10-CM | POA: Diagnosis not present

## 2018-07-11 ENCOUNTER — Telehealth: Payer: Self-pay

## 2018-07-11 ENCOUNTER — Other Ambulatory Visit: Payer: Self-pay

## 2018-07-11 ENCOUNTER — Telehealth: Payer: Self-pay | Admitting: Family Medicine

## 2018-07-11 ENCOUNTER — Telehealth: Payer: Self-pay | Admitting: Cardiovascular Disease

## 2018-07-11 DIAGNOSIS — D631 Anemia in chronic kidney disease: Secondary | ICD-10-CM | POA: Diagnosis not present

## 2018-07-11 DIAGNOSIS — N186 End stage renal disease: Secondary | ICD-10-CM | POA: Diagnosis not present

## 2018-07-11 DIAGNOSIS — N2581 Secondary hyperparathyroidism of renal origin: Secondary | ICD-10-CM | POA: Diagnosis not present

## 2018-07-11 DIAGNOSIS — E1122 Type 2 diabetes mellitus with diabetic chronic kidney disease: Secondary | ICD-10-CM | POA: Diagnosis not present

## 2018-07-11 MED ORDER — GABAPENTIN 300 MG PO CAPS: ORAL_CAPSULE | ORAL | 0 refills | Status: DC

## 2018-07-11 MED ORDER — ATORVASTATIN CALCIUM 80 MG PO TABS: 80.0000 mg | ORAL_TABLET | Freq: Every day | ORAL | 1 refills | Status: DC

## 2018-07-11 NOTE — Telephone Encounter (Signed)
°*  STAT* If patient is at the pharmacy, call can be transferred to refill team.   1. Which medications need to be refilled? (please list name of each medication and dose if known) atorvastatin (LIPITOR) 80 MG 1 daily   2. Which pharmacy/location (including street and city if local pharmacy) is medication to be sent to? [facility] on [address]  3. Do they need a 30 day or 90 day supply? 90 day

## 2018-07-11 NOTE — Telephone Encounter (Signed)
atorvastatin (LIPITOR) 80 MG tablet 90 tablet 1 07/11/2018    Sig - Route: Take 1 tablet (80 mg total) by mouth daily at 6 PM. - Oral   Sent to pharmacy as: atorvastatin (LIPITOR) 80 MG tablet   E-Prescribing Status: Receipt confirmed by pharmacy (07/11/2018 2:02 PM EDT)   Pharmacy   [facility], [place]

## 2018-07-11 NOTE — Telephone Encounter (Signed)
Called and informed pt that the Rx was sent to her pharmacy.  Antonique Langford,cma

## 2018-07-11 NOTE — Telephone Encounter (Signed)
Sent to pharmacy 

## 2018-07-11 NOTE — Telephone Encounter (Signed)
Pt had evisit 5/5 and was looking for this Rx to be called into pharmacy, but it was not.  Pt states she is now out.  [facility] [address], [place]      [phone] (Phone) [phone] (Fax)

## 2018-07-11 NOTE — Telephone Encounter (Signed)
Copied from [place]. Topic: Quick Communication - Rx Refill/Question >> Jul 11, 2018  1:37 PM Scherrie Gerlach wrote: Medication: gabapentin (NEURONTIN) 300 MG capsule  Pt had evisit 5/5 and was looking for this Rx to be called into pharmacy, but it was not.  Pt states she is now out.  [facility] [address] [place] [phone] (Phone) [phone] (Fax)

## 2018-07-12 ENCOUNTER — Telehealth: Payer: Self-pay | Admitting: Family Medicine

## 2018-07-12 NOTE — Assessment & Plan Note (Signed)
Continue Lipitor and Praluent.

## 2018-07-12 NOTE — Assessment & Plan Note (Signed)
Continue gabapentin.  Refill provided.

## 2018-07-12 NOTE — Assessment & Plan Note (Signed)
Uncontrolled.  Will check with her clinical pharmacist regarding increasing her insulin given that she is ESRD and on dialysis.

## 2018-07-12 NOTE — Telephone Encounter (Signed)
Please contact the patient and get her set up for follow-up in 3 months in the office with me.  Please additionally asked her to see if she has had an A1c in the last 3 months.  If she has not please get her scheduled for 1 in about a month.

## 2018-07-14 ENCOUNTER — Other Ambulatory Visit: Payer: Self-pay

## 2018-07-14 ENCOUNTER — Telehealth: Payer: Self-pay

## 2018-07-14 DIAGNOSIS — N186 End stage renal disease: Secondary | ICD-10-CM | POA: Diagnosis not present

## 2018-07-14 DIAGNOSIS — E1122 Type 2 diabetes mellitus with diabetic chronic kidney disease: Secondary | ICD-10-CM | POA: Diagnosis not present

## 2018-07-14 DIAGNOSIS — N2581 Secondary hyperparathyroidism of renal origin: Secondary | ICD-10-CM | POA: Diagnosis not present

## 2018-07-14 DIAGNOSIS — D631 Anemia in chronic kidney disease: Secondary | ICD-10-CM | POA: Diagnosis not present

## 2018-07-14 MED ORDER — BASAGLAR KWIKPEN 100 UNIT/ML ~~LOC~~ SOPN: 30.0000 [IU] | PEN_INJECTOR | Freq: Every day | SUBCUTANEOUS | 6 refills | Status: DC

## 2018-07-14 NOTE — Telephone Encounter (Signed)
Signed and given to [place].

## 2018-07-14 NOTE — Telephone Encounter (Signed)
Can you print this prescription out? My connection to the printer is not working. Then I can sign it. Thanks.

## 2018-07-14 NOTE — Telephone Encounter (Signed)
Placed in RED folder to sign  

## 2018-07-14 NOTE — Telephone Encounter (Signed)
Copied from [place] [phone]. Topic: General - Other >> Jul 11, 2018  4:50 PM Yvette Rack wrote: Reason for CRM: Bonnita Nasuti with [employer] request a new Rx for Basaglar 100 unit inject quick pen. Bonnita Nasuti request that the Rx be sent to them at fax# [phone].

## 2018-07-14 NOTE — Telephone Encounter (Signed)
Called pt and left a VM to call back.  

## 2018-07-14 NOTE — Telephone Encounter (Signed)
Sent to be faxed.  

## 2018-07-15 ENCOUNTER — Telehealth: Payer: Self-pay | Admitting: Family Medicine

## 2018-07-15 NOTE — Telephone Encounter (Signed)
Please let the patient know I heard back from the clinical pharmacist and she advised to increase her basaglar to 34 units daily. The pharmacist will follow-up with her in several weeks to check on her diabetes.

## 2018-07-15 NOTE — Telephone Encounter (Signed)
-----   Message from De Hollingshead, [facility] sent at 07/14/2018  9:26 AM EDT ----- Agreed, I'm typically conservative with insulin dose adjustments in ESRD. Since she's so uncontrolled, I think going from 30 to 34 is appropriate.   Would you like me to call her in a few weeks for further adjustments?  Catie  ----- Message ----- From: [name], MD Sent: 07/12/2018  12:37 PM EDT To: De Hollingshead, [facility]  Hey Catie,  This patient is on basaglar. She also has ESRD and is on dialysis. Her cbgs fasting have been in the 200s and over 300 after dialysis. I would like to increase the basaglar, though given that she is on dialysis I wanted to check and see if there was anything special to do with increasing the dose. I typically increase about 10% at once. Let me know your thoughts.  Randall Hiss

## 2018-07-18 ENCOUNTER — Telehealth: Payer: Self-pay | Admitting: Family Medicine

## 2018-07-18 DIAGNOSIS — N2581 Secondary hyperparathyroidism of renal origin: Secondary | ICD-10-CM | POA: Diagnosis not present

## 2018-07-18 DIAGNOSIS — D631 Anemia in chronic kidney disease: Secondary | ICD-10-CM | POA: Diagnosis not present

## 2018-07-18 DIAGNOSIS — E1122 Type 2 diabetes mellitus with diabetic chronic kidney disease: Secondary | ICD-10-CM | POA: Diagnosis not present

## 2018-07-18 DIAGNOSIS — N186 End stage renal disease: Secondary | ICD-10-CM | POA: Diagnosis not present

## 2018-07-18 NOTE — Telephone Encounter (Signed)
Copied from [place] [phone]. Topic: Quick Communication - See Telephone Encounter >> Jul 18, 2018 11:49 AM Nils Flack wrote: CRM for notification. See Telephone encounter for: 07/18/18. Husband called- he said dialysis took pt's a1c - it was 8.2 in March . He said this message goes to the pharmacist  Cb is [phone]

## 2018-07-18 NOTE — Telephone Encounter (Signed)
Left message for patient to return call back. PEC may give and obtain information.  

## 2018-07-18 NOTE — Telephone Encounter (Signed)
Noted.  Please follow-up with the patient to ensure that she increased her basaglar to 34 units daily. Thanks.

## 2018-07-21 ENCOUNTER — Other Ambulatory Visit: Payer: Self-pay | Admitting: Family Medicine

## 2018-07-21 DIAGNOSIS — D631 Anemia in chronic kidney disease: Secondary | ICD-10-CM | POA: Diagnosis not present

## 2018-07-21 DIAGNOSIS — E1122 Type 2 diabetes mellitus with diabetic chronic kidney disease: Secondary | ICD-10-CM | POA: Diagnosis not present

## 2018-07-21 DIAGNOSIS — N186 End stage renal disease: Secondary | ICD-10-CM | POA: Diagnosis not present

## 2018-07-21 DIAGNOSIS — N2581 Secondary hyperparathyroidism of renal origin: Secondary | ICD-10-CM | POA: Diagnosis not present

## 2018-07-21 NOTE — Telephone Encounter (Signed)
Called pt and left a detailed VM to call me back on my direct number regarding Basaglar.

## 2018-07-21 NOTE — Telephone Encounter (Signed)
Pt's husband called back he stated that she didn't now she needed to be doing 34 units daily will start this increased dose.   Sent to PCP as an [place]

## 2018-07-21 NOTE — Telephone Encounter (Signed)
Last OV 07/08/2018  Last refilled  traZODone (DESYREL) 50 MG tablet 30 tablet 0 06/21/2018     Next OV none scheduled   Sent to PCP

## 2018-07-23 DIAGNOSIS — N2581 Secondary hyperparathyroidism of renal origin: Secondary | ICD-10-CM | POA: Diagnosis not present

## 2018-07-23 DIAGNOSIS — D631 Anemia in chronic kidney disease: Secondary | ICD-10-CM | POA: Diagnosis not present

## 2018-07-23 DIAGNOSIS — E1122 Type 2 diabetes mellitus with diabetic chronic kidney disease: Secondary | ICD-10-CM | POA: Diagnosis not present

## 2018-07-23 DIAGNOSIS — N186 End stage renal disease: Secondary | ICD-10-CM | POA: Diagnosis not present

## 2018-07-25 DIAGNOSIS — N2581 Secondary hyperparathyroidism of renal origin: Secondary | ICD-10-CM | POA: Diagnosis not present

## 2018-07-25 DIAGNOSIS — E1122 Type 2 diabetes mellitus with diabetic chronic kidney disease: Secondary | ICD-10-CM | POA: Diagnosis not present

## 2018-07-25 DIAGNOSIS — D631 Anemia in chronic kidney disease: Secondary | ICD-10-CM | POA: Diagnosis not present

## 2018-07-25 DIAGNOSIS — N186 End stage renal disease: Secondary | ICD-10-CM | POA: Diagnosis not present

## 2018-07-28 DIAGNOSIS — N2581 Secondary hyperparathyroidism of renal origin: Secondary | ICD-10-CM | POA: Diagnosis not present

## 2018-07-28 DIAGNOSIS — N186 End stage renal disease: Secondary | ICD-10-CM | POA: Diagnosis not present

## 2018-07-28 DIAGNOSIS — D631 Anemia in chronic kidney disease: Secondary | ICD-10-CM | POA: Diagnosis not present

## 2018-07-28 DIAGNOSIS — E1122 Type 2 diabetes mellitus with diabetic chronic kidney disease: Secondary | ICD-10-CM | POA: Diagnosis not present

## 2018-07-30 DIAGNOSIS — N2581 Secondary hyperparathyroidism of renal origin: Secondary | ICD-10-CM | POA: Diagnosis not present

## 2018-07-30 DIAGNOSIS — D631 Anemia in chronic kidney disease: Secondary | ICD-10-CM | POA: Diagnosis not present

## 2018-07-30 DIAGNOSIS — E1122 Type 2 diabetes mellitus with diabetic chronic kidney disease: Secondary | ICD-10-CM | POA: Diagnosis not present

## 2018-07-30 DIAGNOSIS — N186 End stage renal disease: Secondary | ICD-10-CM | POA: Diagnosis not present

## 2018-08-01 DIAGNOSIS — D631 Anemia in chronic kidney disease: Secondary | ICD-10-CM | POA: Diagnosis not present

## 2018-08-01 DIAGNOSIS — E1122 Type 2 diabetes mellitus with diabetic chronic kidney disease: Secondary | ICD-10-CM | POA: Diagnosis not present

## 2018-08-01 DIAGNOSIS — N186 End stage renal disease: Secondary | ICD-10-CM | POA: Diagnosis not present

## 2018-08-01 DIAGNOSIS — N2581 Secondary hyperparathyroidism of renal origin: Secondary | ICD-10-CM | POA: Diagnosis not present

## 2018-08-03 DIAGNOSIS — N186 End stage renal disease: Secondary | ICD-10-CM | POA: Diagnosis not present

## 2018-08-03 DIAGNOSIS — Z992 Dependence on renal dialysis: Secondary | ICD-10-CM | POA: Diagnosis not present

## 2018-08-04 ENCOUNTER — Telehealth: Payer: Self-pay | Admitting: Pharmacist

## 2018-08-04 DIAGNOSIS — D631 Anemia in chronic kidney disease: Secondary | ICD-10-CM | POA: Diagnosis not present

## 2018-08-04 DIAGNOSIS — D509 Iron deficiency anemia, unspecified: Secondary | ICD-10-CM | POA: Diagnosis not present

## 2018-08-04 DIAGNOSIS — N2581 Secondary hyperparathyroidism of renal origin: Secondary | ICD-10-CM | POA: Diagnosis not present

## 2018-08-04 DIAGNOSIS — N186 End stage renal disease: Secondary | ICD-10-CM | POA: Diagnosis not present

## 2018-08-04 DIAGNOSIS — E1122 Type 2 diabetes mellitus with diabetic chronic kidney disease: Secondary | ICD-10-CM | POA: Diagnosis not present

## 2018-08-04 NOTE — Telephone Encounter (Signed)
Called patient to review blood sugars. However, she noted that her husband was having back surgery right now, and she is at dialysis.   Scheduled a phone call on Friday afternoon.   Catie Darnelle Maffucci, PharmD, [place] PGY2 Ambulatory Care Pharmacy Resident, [facility] Phone: [phone]

## 2018-08-06 ENCOUNTER — Encounter: Payer: Self-pay | Admitting: Family Medicine

## 2018-08-06 DIAGNOSIS — N186 End stage renal disease: Secondary | ICD-10-CM | POA: Diagnosis not present

## 2018-08-06 DIAGNOSIS — E1122 Type 2 diabetes mellitus with diabetic chronic kidney disease: Secondary | ICD-10-CM | POA: Diagnosis not present

## 2018-08-06 DIAGNOSIS — D631 Anemia in chronic kidney disease: Secondary | ICD-10-CM | POA: Diagnosis not present

## 2018-08-06 DIAGNOSIS — D509 Iron deficiency anemia, unspecified: Secondary | ICD-10-CM | POA: Diagnosis not present

## 2018-08-06 DIAGNOSIS — N2581 Secondary hyperparathyroidism of renal origin: Secondary | ICD-10-CM | POA: Diagnosis not present

## 2018-08-07 ENCOUNTER — Encounter (INDEPENDENT_AMBULATORY_CARE_PROVIDER_SITE_OTHER): Payer: Medicare Other

## 2018-08-07 ENCOUNTER — Ambulatory Visit (INDEPENDENT_AMBULATORY_CARE_PROVIDER_SITE_OTHER): Payer: Medicare Other | Admitting: Vascular Surgery

## 2018-08-08 DIAGNOSIS — N186 End stage renal disease: Secondary | ICD-10-CM | POA: Diagnosis not present

## 2018-08-08 DIAGNOSIS — D631 Anemia in chronic kidney disease: Secondary | ICD-10-CM | POA: Diagnosis not present

## 2018-08-08 DIAGNOSIS — D509 Iron deficiency anemia, unspecified: Secondary | ICD-10-CM | POA: Diagnosis not present

## 2018-08-08 DIAGNOSIS — N2581 Secondary hyperparathyroidism of renal origin: Secondary | ICD-10-CM | POA: Diagnosis not present

## 2018-08-08 DIAGNOSIS — E1122 Type 2 diabetes mellitus with diabetic chronic kidney disease: Secondary | ICD-10-CM | POA: Diagnosis not present

## 2018-08-08 LAB — BASIC METABOLIC PANEL: Creatinine: 9.8 — AB (ref <=1.1)

## 2018-08-11 DIAGNOSIS — E1122 Type 2 diabetes mellitus with diabetic chronic kidney disease: Secondary | ICD-10-CM | POA: Diagnosis not present

## 2018-08-11 DIAGNOSIS — N186 End stage renal disease: Secondary | ICD-10-CM | POA: Diagnosis not present

## 2018-08-11 DIAGNOSIS — D509 Iron deficiency anemia, unspecified: Secondary | ICD-10-CM | POA: Diagnosis not present

## 2018-08-11 DIAGNOSIS — D631 Anemia in chronic kidney disease: Secondary | ICD-10-CM | POA: Diagnosis not present

## 2018-08-11 DIAGNOSIS — N2581 Secondary hyperparathyroidism of renal origin: Secondary | ICD-10-CM | POA: Diagnosis not present

## 2018-08-13 DIAGNOSIS — N2581 Secondary hyperparathyroidism of renal origin: Secondary | ICD-10-CM | POA: Diagnosis not present

## 2018-08-13 DIAGNOSIS — D631 Anemia in chronic kidney disease: Secondary | ICD-10-CM | POA: Diagnosis not present

## 2018-08-13 DIAGNOSIS — E1122 Type 2 diabetes mellitus with diabetic chronic kidney disease: Secondary | ICD-10-CM | POA: Diagnosis not present

## 2018-08-13 DIAGNOSIS — D509 Iron deficiency anemia, unspecified: Secondary | ICD-10-CM | POA: Diagnosis not present

## 2018-08-13 DIAGNOSIS — N186 End stage renal disease: Secondary | ICD-10-CM | POA: Diagnosis not present

## 2018-08-14 ENCOUNTER — Telehealth: Payer: Self-pay | Admitting: Pharmacist

## 2018-08-14 DIAGNOSIS — E1140 Type 2 diabetes mellitus with diabetic neuropathy, unspecified: Secondary | ICD-10-CM

## 2018-08-14 MED ORDER — BASAGLAR KWIKPEN 100 UNIT/ML ~~LOC~~ SOPN: 40.0000 [IU] | PEN_INJECTOR | Freq: Every day | SUBCUTANEOUS | 6 refills | Status: DC

## 2018-08-14 NOTE — Telephone Encounter (Signed)
S:     Chief Complaint  Patient presents with  . Medication Management    Patient contacted telephonically for diabetes evaluation, education, and management at the request of Dr. Caryl Bis (referred in 07/15/2018 phone visit). Last seen by primary care provider on 07/08/2018 - at that time, Basaglar was increased from 30 to 34 units daily.   Today, she reports that she is being evaluated for transition from HD to PD. She has an appointment on 08/27/2018 to discuss the process and set up dates for PD catheter placement and start.   She reports that BG remain elevated. She has questions about what type of foods and drinks impact blood sugar the most.   Insurance coverage/medication affordability: Medicare A/B;  - Engineer, building services from [employer] patient assistance  Patient reports adherence with medications.  Current diabetes medications include: Basaglar 34 units daily  Current hypertension medications include: none, on midodrine for hypotension Current hyperlipidemic medications include: atorvastatin   Patient denies hypoglycemic s/sx including dizziness, shakiness, sweating. Patient denies hyperglycemic symptoms including polyuria, polydipsia, polyphagia, nocturia, neuropathy, blurred vision.   Patient reported dietary habits: Eats 3 meals/day Breakfast: Egg, liver pudding, tomato; occasionally [place] bacon and egg Lunch: Nothing on dialysis days; leftovers other days Dinner: Steak, hamburger, salad; vegetables (beans, corn, mixed vegetables);  Snacks: none  Drinks: Drinks regular sodas;    Patient reported exercise habits: Has a walker, she plans to go walking outside on nice days    O:   Lab Results  Component Value Date   HGBA1C 7.1 96/29/5284    Basic Metabolic Panel BMP Latest Ref Rng & Units 05/20/2018 09/05/2017 09/04/2017  Glucose 70 - 99 mg/dL - 84 -  BUN 8 - 23 mg/dL - 47(H) -  Creatinine 0.44 - 1.00 mg/dL - 10.14(H) 15.96(H)  Sodium 135 - 145 mmol/L - 142 -   Potassium 3.5 - 5.1 mmol/L 5.2(H) 4.2 -  Chloride 98 - 111 mmol/L - 101 -  CO2 22 - 32 mmol/L - 27 -  Calcium 8.9 - 10.3 mg/dL - 8.4(L) -     Lipid Panel     Component Value Date/Time   CHOL 153 02/11/2018 1404   TRIG 294 (H) 02/11/2018 1404   HDL 43 02/11/2018 1404   CHOLHDL 3.6 02/11/2018 1404   CHOLHDL 3.4 11/07/2017 0950   VLDL 22 11/07/2017 0950   LDLCALC 51 02/11/2018 1404   LDLDIRECT 60 02/11/2018 1404   LDLDIRECT 126.9 10/31/2012 0801    Self-Monitored Blood Glucose Results Fasting SMBG: 210-280s 2 hour post-prandial/random SMBG: 360-400s  Clinical ASCVD: Yes   A/P: #Diabetes - Currently uncontrolled, reporting blood sugars remaining significantly elevated. Goal A1c <7%, though more relaxed control may be appropriate. Patient denies hypoglycemic events. Appropriate to increase basal insulin.  - Increase Basaglar to 40 units daily - Educated on reduction in carbohydrate portion sizes, and encouraged to stop drinking sugar soft drinks.  - Educated that glucose management will likely need to be altered when she start PD, due to glucose content of PD bags. She verbalized understanding - She will contact with any hypoglycemia. Otherwise, I will speak with her next week.   De Hollingshead, PharmD, [place] PGY2 Ambulatory Care Pharmacy Resident Phone: [phone]

## 2018-08-15 DIAGNOSIS — D509 Iron deficiency anemia, unspecified: Secondary | ICD-10-CM | POA: Diagnosis not present

## 2018-08-15 DIAGNOSIS — D631 Anemia in chronic kidney disease: Secondary | ICD-10-CM | POA: Diagnosis not present

## 2018-08-15 DIAGNOSIS — E1122 Type 2 diabetes mellitus with diabetic chronic kidney disease: Secondary | ICD-10-CM | POA: Diagnosis not present

## 2018-08-15 DIAGNOSIS — N2581 Secondary hyperparathyroidism of renal origin: Secondary | ICD-10-CM | POA: Diagnosis not present

## 2018-08-15 DIAGNOSIS — N186 End stage renal disease: Secondary | ICD-10-CM | POA: Diagnosis not present

## 2018-08-15 NOTE — Telephone Encounter (Signed)
I have reviewed the above note and agree. I was available to the pharmacist for consultation.  [name], MD

## 2018-08-16 ENCOUNTER — Other Ambulatory Visit: Payer: Self-pay | Admitting: Cardiovascular Disease

## 2018-08-18 DIAGNOSIS — N186 End stage renal disease: Secondary | ICD-10-CM | POA: Diagnosis not present

## 2018-08-18 DIAGNOSIS — E1122 Type 2 diabetes mellitus with diabetic chronic kidney disease: Secondary | ICD-10-CM | POA: Diagnosis not present

## 2018-08-18 DIAGNOSIS — D509 Iron deficiency anemia, unspecified: Secondary | ICD-10-CM | POA: Diagnosis not present

## 2018-08-18 DIAGNOSIS — N2581 Secondary hyperparathyroidism of renal origin: Secondary | ICD-10-CM | POA: Diagnosis not present

## 2018-08-18 DIAGNOSIS — D631 Anemia in chronic kidney disease: Secondary | ICD-10-CM | POA: Diagnosis not present

## 2018-08-23 ENCOUNTER — Other Ambulatory Visit: Payer: Self-pay

## 2018-08-23 ENCOUNTER — Emergency Department: Payer: Medicare Other

## 2018-08-23 ENCOUNTER — Inpatient Hospital Stay (HOSPITAL_COMMUNITY)
Admission: AD | Admit: 2018-08-23 | Discharge: 2018-09-15 | DRG: 177 | Disposition: A | Discharge status: Skilled Nursing Facility | Payer: Medicare Other | Source: Other Acute Inpatient Hospital | Attending: Student | Admitting: Student

## 2018-08-23 ENCOUNTER — Emergency Department
Admission: EM | Admit: 2018-08-23 | Discharge: 2018-08-23 | Payer: Medicare Other | Attending: Emergency Medicine | Admitting: Emergency Medicine

## 2018-08-23 DIAGNOSIS — I132 Hypertensive heart and chronic kidney disease with heart failure and with stage 5 chronic kidney disease, or end stage renal disease: Secondary | ICD-10-CM | POA: Diagnosis present

## 2018-08-23 DIAGNOSIS — F4321 Adjustment disorder with depressed mood: Secondary | ICD-10-CM

## 2018-08-23 DIAGNOSIS — N2581 Secondary hyperparathyroidism of renal origin: Secondary | ICD-10-CM | POA: Diagnosis not present

## 2018-08-23 DIAGNOSIS — I2510 Atherosclerotic heart disease of native coronary artery without angina pectoris: Secondary | ICD-10-CM | POA: Diagnosis not present

## 2018-08-23 DIAGNOSIS — I252 Old myocardial infarction: Secondary | ICD-10-CM

## 2018-08-23 DIAGNOSIS — D696 Thrombocytopenia, unspecified: Secondary | ICD-10-CM | POA: Diagnosis present

## 2018-08-23 DIAGNOSIS — E039 Hypothyroidism, unspecified: Secondary | ICD-10-CM | POA: Diagnosis present

## 2018-08-23 DIAGNOSIS — I10 Essential (primary) hypertension: Secondary | ICD-10-CM | POA: Diagnosis present

## 2018-08-23 DIAGNOSIS — R0902 Hypoxemia: Secondary | ICD-10-CM | POA: Diagnosis not present

## 2018-08-23 DIAGNOSIS — Z794 Long term (current) use of insulin: Secondary | ICD-10-CM

## 2018-08-23 DIAGNOSIS — Z79899 Other long term (current) drug therapy: Secondary | ICD-10-CM | POA: Diagnosis not present

## 2018-08-23 DIAGNOSIS — J189 Pneumonia, unspecified organism: Secondary | ICD-10-CM | POA: Diagnosis not present

## 2018-08-23 DIAGNOSIS — E11649 Type 2 diabetes mellitus with hypoglycemia without coma: Secondary | ICD-10-CM | POA: Diagnosis not present

## 2018-08-23 DIAGNOSIS — Z992 Dependence on renal dialysis: Secondary | ICD-10-CM | POA: Diagnosis not present

## 2018-08-23 DIAGNOSIS — N390 Urinary tract infection, site not specified: Secondary | ICD-10-CM | POA: Diagnosis not present

## 2018-08-23 DIAGNOSIS — R5381 Other malaise: Secondary | ICD-10-CM | POA: Diagnosis not present

## 2018-08-23 DIAGNOSIS — R531 Weakness: Secondary | ICD-10-CM | POA: Diagnosis not present

## 2018-08-23 DIAGNOSIS — U071 COVID-19: Secondary | ICD-10-CM | POA: Diagnosis present

## 2018-08-23 DIAGNOSIS — N186 End stage renal disease: Secondary | ICD-10-CM | POA: Diagnosis not present

## 2018-08-23 DIAGNOSIS — E785 Hyperlipidemia, unspecified: Secondary | ICD-10-CM | POA: Diagnosis present

## 2018-08-23 DIAGNOSIS — Z833 Family history of diabetes mellitus: Secondary | ICD-10-CM

## 2018-08-23 DIAGNOSIS — K219 Gastro-esophageal reflux disease without esophagitis: Secondary | ICD-10-CM | POA: Diagnosis present

## 2018-08-23 DIAGNOSIS — Z7982 Long term (current) use of aspirin: Secondary | ICD-10-CM | POA: Diagnosis not present

## 2018-08-23 DIAGNOSIS — J1289 Other viral pneumonia: Secondary | ICD-10-CM | POA: Diagnosis not present

## 2018-08-23 DIAGNOSIS — R509 Fever, unspecified: Secondary | ICD-10-CM

## 2018-08-23 DIAGNOSIS — Z9115 Patient's noncompliance with renal dialysis: Secondary | ICD-10-CM

## 2018-08-23 DIAGNOSIS — J929 Pleural plaque without asbestos: Secondary | ICD-10-CM | POA: Diagnosis not present

## 2018-08-23 DIAGNOSIS — E1122 Type 2 diabetes mellitus with diabetic chronic kidney disease: Secondary | ICD-10-CM | POA: Diagnosis present

## 2018-08-23 DIAGNOSIS — Z9861 Coronary angioplasty status: Secondary | ICD-10-CM

## 2018-08-23 DIAGNOSIS — R42 Dizziness and giddiness: Secondary | ICD-10-CM | POA: Diagnosis not present

## 2018-08-23 DIAGNOSIS — G8929 Other chronic pain: Secondary | ICD-10-CM | POA: Diagnosis present

## 2018-08-23 DIAGNOSIS — J9621 Acute and chronic respiratory failure with hypoxia: Secondary | ICD-10-CM | POA: Diagnosis not present

## 2018-08-23 DIAGNOSIS — R0602 Shortness of breath: Secondary | ICD-10-CM | POA: Diagnosis not present

## 2018-08-23 DIAGNOSIS — E1165 Type 2 diabetes mellitus with hyperglycemia: Secondary | ICD-10-CM | POA: Diagnosis present

## 2018-08-23 DIAGNOSIS — I5032 Chronic diastolic (congestive) heart failure: Secondary | ICD-10-CM | POA: Diagnosis present

## 2018-08-23 DIAGNOSIS — I120 Hypertensive chronic kidney disease with stage 5 chronic kidney disease or end stage renal disease: Secondary | ICD-10-CM | POA: Diagnosis not present

## 2018-08-23 DIAGNOSIS — Z951 Presence of aortocoronary bypass graft: Secondary | ICD-10-CM | POA: Diagnosis not present

## 2018-08-23 DIAGNOSIS — I480 Paroxysmal atrial fibrillation: Secondary | ICD-10-CM | POA: Diagnosis present

## 2018-08-23 DIAGNOSIS — I5030 Unspecified diastolic (congestive) heart failure: Secondary | ICD-10-CM | POA: Diagnosis not present

## 2018-08-23 DIAGNOSIS — R630 Anorexia: Secondary | ICD-10-CM | POA: Diagnosis not present

## 2018-08-23 DIAGNOSIS — R251 Tremor, unspecified: Secondary | ICD-10-CM | POA: Diagnosis not present

## 2018-08-23 DIAGNOSIS — R55 Syncope and collapse: Secondary | ICD-10-CM | POA: Diagnosis not present

## 2018-08-23 DIAGNOSIS — M545 Low back pain: Secondary | ICD-10-CM | POA: Diagnosis present

## 2018-08-23 DIAGNOSIS — Z7401 Bed confinement status: Secondary | ICD-10-CM | POA: Diagnosis not present

## 2018-08-23 DIAGNOSIS — N3000 Acute cystitis without hematuria: Secondary | ICD-10-CM | POA: Diagnosis not present

## 2018-08-23 DIAGNOSIS — I959 Hypotension, unspecified: Secondary | ICD-10-CM | POA: Diagnosis not present

## 2018-08-23 DIAGNOSIS — D631 Anemia in chronic kidney disease: Secondary | ICD-10-CM | POA: Diagnosis not present

## 2018-08-23 DIAGNOSIS — E875 Hyperkalemia: Secondary | ICD-10-CM | POA: Diagnosis present

## 2018-08-23 DIAGNOSIS — F329 Major depressive disorder, single episode, unspecified: Secondary | ICD-10-CM | POA: Diagnosis not present

## 2018-08-23 DIAGNOSIS — Z6833 Body mass index (BMI) 33.0-33.9, adult: Secondary | ICD-10-CM

## 2018-08-23 DIAGNOSIS — Z888 Allergy status to other drugs, medicaments and biological substances status: Secondary | ICD-10-CM

## 2018-08-23 DIAGNOSIS — I4819 Other persistent atrial fibrillation: Secondary | ICD-10-CM | POA: Diagnosis not present

## 2018-08-23 DIAGNOSIS — E1140 Type 2 diabetes mellitus with diabetic neuropathy, unspecified: Secondary | ICD-10-CM

## 2018-08-23 DIAGNOSIS — E119 Type 2 diabetes mellitus without complications: Secondary | ICD-10-CM | POA: Diagnosis not present

## 2018-08-23 DIAGNOSIS — Z79890 Hormone replacement therapy (postmenopausal): Secondary | ICD-10-CM

## 2018-08-23 DIAGNOSIS — E2740 Unspecified adrenocortical insufficiency: Secondary | ICD-10-CM | POA: Diagnosis not present

## 2018-08-23 DIAGNOSIS — M2550 Pain in unspecified joint: Secondary | ICD-10-CM | POA: Diagnosis not present

## 2018-08-23 LAB — CBC WITH DIFFERENTIAL/PLATELET
Abs Immature Granulocytes: 0.03 10*3/uL (ref 0.00–0.07)
Basophils Absolute: 0 10*3/uL (ref 0.0–0.1)
Basophils Relative: 0 %
Eosinophils Absolute: 0 10*3/uL (ref 0.0–0.5)
Eosinophils Relative: 0 %
HCT: 29.3 % — ABNORMAL LOW (ref 36.0–46.0)
Hemoglobin: 9.5 g/dL — ABNORMAL LOW (ref 12.0–15.0)
Immature Granulocytes: 1 %
Lymphocytes Relative: 12 %
Lymphs Abs: 0.6 10*3/uL — ABNORMAL LOW (ref 0.7–4.0)
MCH: 32.1 pg (ref 26.0–34.0)
MCHC: 32.4 g/dL (ref 30.0–36.0)
MCV: 99 fL (ref 80.0–100.0)
Monocytes Absolute: 0.2 10*3/uL (ref 0.1–1.0)
Monocytes Relative: 4 %
Neutro Abs: 4.1 10*3/uL (ref 1.7–7.7)
Neutrophils Relative %: 83 %
Platelets: 121 10*3/uL — ABNORMAL LOW (ref 150–400)
RBC: 2.96 MIL/uL — ABNORMAL LOW (ref 3.87–5.11)
RDW: 14.5 % (ref 11.5–15.5)
WBC: 5 10*3/uL (ref 4.0–10.5)
nRBC: 0 % (ref 0.0–0.2)

## 2018-08-23 LAB — COMPREHENSIVE METABOLIC PANEL
ALT: 16 U/L (ref 0–44)
AST: 28 U/L (ref 15–41)
Albumin: 3.3 g/dL — ABNORMAL LOW (ref 3.5–5.0)
Alkaline Phosphatase: 45 U/L (ref 38–126)
Anion gap: 20 — ABNORMAL HIGH (ref 5–15)
BUN: 77 mg/dL — ABNORMAL HIGH (ref 8–23)
CO2: 24 mmol/L (ref 22–32)
Calcium: 7.2 mg/dL — ABNORMAL LOW (ref 8.9–10.3)
Chloride: 88 mmol/L — ABNORMAL LOW (ref 98–111)
Creatinine, Ser: 15.71 mg/dL — ABNORMAL HIGH (ref 0.44–1.00)
GFR calc Af Amer: 2 mL/min — ABNORMAL LOW (ref >60)
GFR calc non Af Amer: 2 mL/min — ABNORMAL LOW (ref >60)
Glucose, Bld: 153 mg/dL — ABNORMAL HIGH (ref 70–99)
Potassium: 5.1 mmol/L (ref 3.5–5.1)
Sodium: 132 mmol/L — ABNORMAL LOW (ref 135–145)
Total Bilirubin: 0.9 mg/dL (ref 0.3–1.2)
Total Protein: 6.8 g/dL (ref 6.5–8.1)

## 2018-08-23 LAB — LACTIC ACID, PLASMA: Lactic Acid, Venous: 1.3 mmol/L (ref 0.5–1.9)

## 2018-08-23 LAB — URINALYSIS, COMPLETE (UACMP) WITH MICROSCOPIC
Bacteria, UA: NONE SEEN
Bilirubin Urine: NEGATIVE
Glucose, UA: NEGATIVE mg/dL
Ketones, ur: NEGATIVE mg/dL
Nitrite: NEGATIVE
Protein, ur: 100 mg/dL — AB
Specific Gravity, Urine: 1.016 (ref 1.005–1.030)
pH: 5 (ref 5.0–8.0)

## 2018-08-23 LAB — GLUCOSE, CAPILLARY
Glucose-Capillary: 65 mg/dL — ABNORMAL LOW (ref 70–99)
Glucose-Capillary: 135 mg/dL — ABNORMAL HIGH (ref 70–99)

## 2018-08-23 LAB — TROPONIN I
Troponin I: 0.03 ng/mL (ref <0.03)
Troponin I: 0.03 ng/mL (ref <0.03)

## 2018-08-23 LAB — SARS CORONAVIRUS 2 BY RT PCR (HOSPITAL ORDER, PERFORMED IN ~~LOC~~ HOSPITAL LAB): SARS Coronavirus 2: POSITIVE — AB

## 2018-08-23 LAB — AMMONIA: Ammonia: 20 umol/L (ref 9–35)

## 2018-08-23 MED ORDER — DEXTROSE 50 % IV SOLN
INTRAVENOUS | Status: AC
  Administered 2018-08-23: 50 mL via INTRAVENOUS
  Filled 2018-08-23: qty 50

## 2018-08-23 MED ORDER — ACETAMINOPHEN 500 MG PO TABS
1000.0000 mg | ORAL_TABLET | Freq: Once | ORAL | Status: AC
  Administered 2018-08-23: 1000 mg via ORAL

## 2018-08-23 MED ORDER — ORAL CARE MOUTH RINSE
15.0000 mL | Freq: Two times a day (BID) | OROMUCOSAL | Status: DC
  Administered 2018-08-24 – 2018-09-14 (×32): 15 mL via OROMUCOSAL

## 2018-08-23 MED ORDER — DEXTROSE 50 % IV SOLN
1.0000 | Freq: Once | INTRAVENOUS | Status: AC
  Administered 2018-08-23: 22:00:00 50 mL via INTRAVENOUS

## 2018-08-23 MED ORDER — ACETAMINOPHEN 500 MG PO TABS
ORAL_TABLET | ORAL | Status: AC
  Administered 2018-08-23: 1000 mg via ORAL
  Filled 2018-08-23: qty 2

## 2018-08-23 MED ORDER — SODIUM CHLORIDE 0.9 % IV SOLN
1.0000 g | Freq: Once | INTRAVENOUS | Status: AC
  Administered 2018-08-23: 1 g via INTRAVENOUS
  Filled 2018-08-23: qty 10

## 2018-08-23 MED ORDER — SODIUM CHLORIDE 0.9 % IV BOLUS
250.0000 mL | Freq: Once | INTRAVENOUS | Status: AC
  Administered 2018-08-23: 250 mL via INTRAVENOUS

## 2018-08-23 NOTE — ED Provider Notes (Addendum)
[facility] Emergency Department Provider Note   ____________________________________________   None    (approximate)  I have reviewed the triage vital signs and the nursing notes.   HISTORY  Chief Complaint Weakness and Tremors    HPI Jamie Arias is a 63 y.o. female patient reports she was to get dialysis on Wednesday but did not go because she was not feeling well missed dialysis on Friday as well.  She was supposed to get a make-up today but when her son got there to pick her up she was lying on the floor.  She could not get up.  She complains of nausea dizziness and tremors.  She also just overall does not feel well.  She states she has been crawling to the bathroom on her hands and knees skinned her knee the other day.  She is not coughing she does make urine is not having any burning.         Past Medical History:  Diagnosis Date  . Anemia   . Chronic diastolic CHF (congestive heart failure) ([place])    a. Due to ischemic cardiomyopathy. EF as low as 35%, improved to normal s/p CABG; b. echo 07/06/13: EF 55-60%, no RWMAs, mod TR, trivial pericardial effusion not c/w tamponade physiology;  c. 10/2015 Echo: EF 65%, Gr1 DD, triv AI, mild MR, mildly dil LA, mod TR, PASP 53mHg.  .Marland KitchenCoronary artery disease    a. NSTEMI 06/2013; b.cath: severe three-vessel CAD w/ EF 30% & mild-mod MR; c. s/p 3 vessel CABG 07/02/13 (LIMA-LAD, SVG-OM, and SVG-RPDA);  d. 10/2015 MV: no ischemia/infarct.  . Diabetes mellitus without complication ([place]   . Diabetic neuropathy ([place]   . Dialysis patient ([facility]    MWF  . ESRD (end stage renal disease) ([place]    a. 12/2015 initiated - mwf dialysis.  .Marland KitchenGERD (gastroesophageal reflux disease)   . Hyperlipidemia   . Hypertension   . Hypothyroidism   . Myocardial infarction ([place] 2015  . Neuropathy   . Pleural effusion 2015  . Pulmonary hypertension ([place]   . Wears dentures    full lower    Patient Active Problem List   Diagnosis Date Noted  . Allergic rhinitis 09/19/2017  . Uremia of renal origin 09/04/2017  . Chronic neck pain 07/25/2017  . Hyperkalemia 02/15/2017  . Complication from renal dialysis device 12/01/2016  . ESRD on dialysis ([place] 01/19/2016  . GI bleed 10/25/2015  . Hypertensive heart disease 10/24/2015  . Chronic diastolic CHF (congestive heart failure) ([place] 10/24/2015  . Unstable angina ([place] 10/23/2015  . Low BP 09/02/2015  . Chronic systolic CHF (congestive heart failure) ([place] 09/02/2015  . Depression 07/27/2015  . Calculus of gallbladder with chronic cholecystitis without obstruction   . Bilateral carotid bruits 11/30/2014  . Low magnesium levels 08/10/2013  . Anemia 08/10/2013  . Constipation 08/10/2013  . Postoperative atrial fibrillation ([place] 07/30/2013  . Coronary artery disease   . CAD (coronary artery disease) 07/02/2013  . Acute systolic heart failure ([place] 06/30/2013  . NSTEMI (non-ST elevated myocardial infarction) ([place] 06/29/2013  . Vertigo 08/25/2012  . Sleep disorder 05/23/2012  . Hypothyroid 04/27/2012  . HTN (hypertension) 04/27/2012  . HLD (hyperlipidemia) 04/27/2012  . Diabetes mellitus, type 2 ([place] 04/27/2012  . Neuropathy 04/27/2012    Past Surgical History:  Procedure Laterality Date  . A/V FISTULAGRAM Right 12/17/2016   Procedure: A/V Fistulagram;  Surgeon: DAlgernon Huxley MD;  [facility];  Service: Cardiovascular;  Laterality: Right;  . A/V FISTULAGRAM Right 01/07/2017   Procedure: A/V Fistulagram;  Surgeon: Algernon Huxley, MD;  Location: [place] [facility];  Service: Cardiovascular;  Laterality: Right;  . A/V FISTULAGRAM Right 12/03/2017   Procedure: A/V FISTULAGRAM;  Surgeon: Katha Cabal, MD;  Location: [place] CV LAB;  Service: Cardiovascular;  Laterality: Right;  . A/V SHUNT INTERVENTION N/A 02/22/2017   Procedure: A/V SHUNT INTERVENTION;  Surgeon: Algernon Huxley, MD;  Location: [place] CV LAB;  Service:  Cardiovascular;  Laterality: N/A;  . AV FISTULA PLACEMENT Right 02/03/2016   Procedure: INSERTION OF ARTERIOVENOUS (AV) GORE-TEX GRAFT ARM ( BRACH / AXILLARY );  Surgeon: Katha Cabal, MD;  Location: [facility];  Service: Vascular;  Laterality: Right;  . CARDIAC CATHETERIZATION    . CATARACT EXTRACTION Bilateral   . CHOLECYSTECTOMY N/A 12/09/2014   Procedure: LAPAROSCOPIC CHOLECYSTECTOMY;  Surgeon: Marlyce [name], MD;  Location: [facility];  Service: General;  Laterality: N/A;  . CORONARY ARTERY BYPASS GRAFT N/A 07/02/2013   Procedure: CORONARY ARTERY BYPASS GRAFTING (CABG);  Surgeon: Ivin [name], MD;  Location: [place];  Service: Open Heart Surgery;  Laterality: N/A;  CABG x three, using left internal mammary artery and right leg greater saphenous vein harvested endoscopically  . ESOPHAGOGASTRODUODENOSCOPY (EGD) WITH PROPOFOL N/A 11/24/2015   Procedure: ESOPHAGOGASTRODUODENOSCOPY (EGD) WITH PROPOFOL;  Surgeon: [name], MD;  Location: [place];  Service: Endoscopy;  Laterality: N/A;  Diabetic - insulin  . EYE SURGERY Bilateral    Cataract Extraction with IOL  . INTRAOPERATIVE TRANSESOPHAGEAL ECHOCARDIOGRAM N/A 07/02/2013   Procedure: INTRAOPERATIVE TRANSESOPHAGEAL ECHOCARDIOGRAM;  Surgeon: Ivin [name], MD;  Location: [place];  Service: Open Heart Surgery;  Laterality: N/A;  . PERIPHERAL VASCULAR CATHETERIZATION Right 12/06/2015   Procedure: Dialysis/Perma Catheter Insertion;  Surgeon: Katha Cabal, MD;  Location: [place] CV LAB;  Service: Cardiovascular;  Laterality: Right;  . PERIPHERAL VASCULAR THROMBECTOMY Right 09/28/2016   Procedure: Peripheral Vascular Thrombectomy;  Surgeon: Katha Cabal, MD;  Location: [place] CV LAB;  Service: Cardiovascular;  Laterality: Right;  . PERIPHERAL VASCULAR THROMBECTOMY Right 05/20/2018   Procedure: PERIPHERAL VASCULAR THROMBECTOMY;  Surgeon: Katha Cabal, MD;  Location: [place] [facility];  Service:  Cardiovascular;  Laterality: Right;  . PORTA CATH REMOVAL N/A 06/01/2016   Procedure: Glori [name] Cath Removal;  Surgeon: Katha Cabal, MD;  Location: [place] CV LAB;  Service: Cardiovascular;  Laterality: N/A;  . THORACENTESIS Left 2015    Prior to Admission medications   Medication Sig Start Date End Date Taking? Authorizing Provider  ACCU-CHEK FASTCLIX LANCETS MISC Check blood glucose twice daily, diagnosis code E11.9 12/13/16   [name], MD  acetaminophen (TYLENOL) 325 MG tablet Take 650 mg by mouth daily as needed for moderate pain or headache.     [provider]  Alirocumab (PRALUENT) 75 MG/ML SOAJ Inject 75 mg into the skin every 14 (fourteen) days. 04/04/18   Wellington Hampshire, MD  aspirin EC 81 MG tablet Take 81 mg by mouth daily.    [provider]  atorvastatin (LIPITOR) 80 MG tablet Take 1 tablet (80 mg total) by mouth daily at 6 PM. 07/11/18   Wellington Hampshire, MD  blood glucose meter kit and supplies KIT Dispense based on patient and insurance preference. Use up to twice daily. (FOR ICD-10 E11.9) 07/01/15   Rubbie Battiest, RN  Blood Glucose Monitoring Suppl (ACCU-CHEK AVIVA PLUS) w/Device KIT Use as directed to  test blood sugar up to three times daily 05/06/17   [name], MD  Calcium Acetate 668 (169 Ca) MG TABS TAKE 3 TABLETS BY MOUTH THREE TIMES DAILY WITH MEALS 09/04/17   [provider]  ergocalciferol (VITAMIN D2) 50000 units capsule Take 50,000 Units by mouth once a week.     [provider]  ferrous sulfate 325 (65 FE) MG tablet Take 325 mg by mouth daily with breakfast.    [provider]  fluticasone (FLONASE) 50 MCG/ACT nasal spray Place 2 sprays into both nostrils daily. Patient not taking: Reported on 08/14/2018 09/17/17   [name], MD  furosemide (LASIX) 20 MG tablet Take 20 mg by mouth every other day. Non-dialysis days    [provider]  gabapentin (NEURONTIN) 300 MG capsule TAKE 1  CAPSULE BY MOUTH THREE TIMES DAILY 07/11/18   [name], MD  glucose blood (ACCU-CHEK AVIVA) test strip Use as instructed to test blood sugar up to 3 times daily 05/06/17   [name], MD  Insulin Glargine (BASAGLAR KWIKPEN) 100 UNIT/ML SOPN Inject 0.4 mLs (40 Units total) into the skin daily. 08/14/18   [name], MD  Insulin Pen Needle (GLOBAL EASE INJECT PEN NEEDLES) 31G X 5 MM MISC Inject 0.1 mLs (10 Units total) into the skin at bedtime 01/12/16   [name], MD  Lancets (ACCU-CHEK SOFT TOUCH) lancets Use as instructed 05/06/17   [name], MD  Lancets Misc. (ACCU-CHEK SOFTCLIX LANCET DEV) KIT Use as directed to test blood sugar up to three times daily 05/06/17   [name], MD  levothyroxine (SYNTHROID) 75 MCG tablet TAKE 1 TABLET BY MOUTH ONCE DAILY OFFICE  VISIT  NEEDED  FOR  FURTHER  REFILLS 06/21/18   [name], MD  lidocaine-prilocaine (EMLA) cream Apply 1 application topically as needed (port access).    [provider]  midodrine (PROAMATINE) 10 MG tablet Take one tablet by mouth twice a day on Monday, Wednesday, Friday 05/06/18   Wellington Hampshire, MD  midodrine (PROAMATINE) 5 MG tablet Take 1 tablet by mouth Twice a day Tuesday, Thursday, Saturday, Sunday. 04/04/18   Wellington Hampshire, MD  nitroGLYCERIN (NITROSTAT) 0.4 MG SL tablet DISSOLVE ONE TABLET UNDER THE TONGUE EVERY 5 MINUTES AS NEEDED FOR CHEST PAIN.  DO NOT EXCEED A TOTAL OF 3 DOSES IN 15 MINUTES 08/18/18   Wellington Hampshire, MD  omeprazole (PRILOSEC) 20 MG capsule Take 20 mg by mouth 2 (two) times daily before a meal.    [provider]  ondansetron (ZOFRAN-ODT) 4 MG disintegrating tablet Take 4 mg by mouth every 8 (eight) hours as needed for nausea or vomiting.  08/23/17   [provider]  traZODone (DESYREL) 50 MG tablet TAKE 1 TABLET BY MOUTH AT BEDTIME 07/21/18   [name], MD    Allergies Nsaids and Doxycycline  Family History  Problem  Relation Age of Onset  . COPD Mother   . Cancer Mother        Lung  . Pulmonary embolism Father   . Diabetes Father   . Diabetes Paternal Grandfather   . Heart disease Maternal Grandmother   . Colon cancer Neg Hx   . Colon polyps Neg Hx   . Esophageal cancer Neg Hx   . Pancreatic cancer Neg Hx   . Liver disease Neg Hx     Social History Social History   Tobacco Use  . Smoking status:  Never Smoker  . Smokeless tobacco: Never Used  Substance Use Topics  . Alcohol use: No    Alcohol/week: 0.0 standard drinks  . Drug use: No    Review of Systems  Constitutional: No fever/chills Eyes: No visual changes. ENT: No sore throat. Cardiovascular: Denies chest pain. Respiratory: Denies shortness of breath. Gastrointestinal: No abdominal pain.  No nausea, no vomiting.  No diarrhea.  No constipation. Genitourinary: Negative for dysuria. Musculoskeletal: Negative for back pain. Skin: Negative for rash. Neurological: Negative for headaches, focal weakness    ____________________________________________   PHYSICAL EXAM:  VITAL SIGNS: ED Triage Vitals  Enc Vitals Group     BP 08/23/18 0856 (!) 122/54     Pulse Rate 08/23/18 0856 72     Resp 08/23/18 0856 19     Temp 08/23/18 0856 (!) 100.7 F (38.2 C)     Temp Source 08/23/18 0856 Oral     SpO2 08/23/18 0856 93 %     Weight 08/23/18 0857 210 lb (95.3 kg)     Height 08/23/18 0857 '5\' 4"'$  (1.626 m)     Head Circumference --      Peak Flow --      Pain Score 08/23/18 0857 0     Pain Loc --      Pain Edu? --      Excl. in [place]? --     Constitutional: Alert and oriented.  Appears weak and tired Eyes: Conjunctivae are normal. PERRL. EOMI. Head: Atraumatic. Nose: No congestion/rhinnorhea. Mouth/Throat: Mucous membranes are moist.  Oropharynx non-erythematous. Neck: No stridor.   Cardiovascular: Normal rate, regular rhythm. Grossly normal heart sounds.  Good peripheral circulation. Respiratory: Normal respiratory effort.  No  retractions. Lungs CTAB. Gastrointestinal: Soft and nontender. No distention. No abdominal bruits. No CVA tenderness. Musculoskeletal: No lower extremity tenderness nor edema.   Neurologic:  Normal speech and language.  Patient is having a resting tremor.  Cranial nerves II through XII are intact although visual fields were not checked cerebellar finger-nose and rapid alternating movements are normal motor strength is 5/5 throughout and he does not report any numbness Skin:  Skin is warm, dry and intact. No rash noted.   ____________________________________________   LABS (all labs ordered are listed, but only abnormal results are displayed)  Labs Reviewed  SARS CORONAVIRUS 2 (HOSPITAL ORDER, [place] [facility]) - Abnormal; Notable for the following components:      Result Value   SARS Coronavirus 2 POSITIVE (*)    All other components within normal limits  COMPREHENSIVE METABOLIC PANEL - Abnormal; Notable for the following components:   Sodium 132 (*)    Chloride 88 (*)    Glucose, Bld 153 (*)    BUN 77 (*)    Creatinine, Ser 15.71 (*)    Calcium 7.2 (*)    Albumin 3.3 (*)    GFR calc non Af Amer 2 (*)    GFR calc Af Amer 2 (*)    Anion gap 20 (*)    All other components within normal limits  TROPONIN I - Abnormal; Notable for the following components:   Troponin I 0.03 (*)    All other components within normal limits  URINALYSIS, COMPLETE (UACMP) WITH MICROSCOPIC - Abnormal; Notable for the following components:   Color, Urine YELLOW (*)    APPearance CLOUDY (*)    Hgb urine dipstick SMALL (*)    Protein, ur 100 (*)    Leukocytes,Ua SMALL (*)    All  other components within normal limits  CBC WITH DIFFERENTIAL/PLATELET - Abnormal; Notable for the following components:   RBC 2.96 (*)    Hemoglobin 9.5 (*)    HCT 29.3 (*)    Platelets 121 (*)    Lymphs Abs 0.6 (*)    All other components within normal limits  TROPONIN I - Abnormal; Notable for the  following components:   Troponin I 0.03 (*)    All other components within normal limits  CULTURE, BLOOD (ROUTINE X 2)  CULTURE, BLOOD (ROUTINE X 2)  URINE CULTURE  LACTIC ACID, PLASMA  AMMONIA  CBC WITH DIFFERENTIAL/PLATELET   ____________________________________________  EKG  EKG read and interpreted by me shows normal sinus rhythm rate of 69 normal axis no acute ST-T wave changes very irregular baseline ____________________________________________  RADIOLOGY  ED MD interpretation: CT of the head read by radiology reviewed by me shows no acute disease.  Chest x-ray also read by radiology reviewed by me shows no acute disease is mild cardiomegaly.  Official radiology report(s): Ct Head Wo Contrast  Result Date: 08/23/2018 CLINICAL DATA:  Dizziness and weakness with tremors. Dialysis patient. EXAM: CT HEAD WITHOUT CONTRAST TECHNIQUE: Contiguous axial images were obtained from the base of the skull through the vertex without intravenous contrast. COMPARISON:  09/04/2017 FINDINGS: Brain: Ventricles, cisterns and other CSF spaces are within normal. There is no mass, mass effect, shift of midline structures or acute hemorrhage. There is mild chronic ischemic microvascular disease present. Vascular: No hyperdense vessel or unexpected calcification. Skull: Normal. Negative for fracture or focal lesion. Sinuses/Orbits: Orbits are normal. Minimal mucosal membrane thickening over the paranasal sinuses. Mastoid air cells are clear. Other: None. IMPRESSION: No acute findings. Mild chronic ischemic microvascular disease. Minimal chronic sinus inflammatory change. Electronically Signed   By: Marin Olp M.D.   On: 08/23/2018 12:18   Dg Chest Portable 1 View  Result Date: 08/23/2018 CLINICAL DATA:  Dizziness and weakness.  Dialysis. EXAM: PORTABLE CHEST 1 VIEW COMPARISON:  09/04/2017 FINDINGS: Lungs are adequately inflated without lobar consolidation or effusion. Mild stable cardiomegaly. Remainder  of the exam is unchanged. IMPRESSION: No acute cardiopulmonary disease. Mild stable cardiomegaly. Electronically Signed   By: Marin Olp M.D.   On: 08/23/2018 10:25    ____________________________________________   PROCEDURES  Procedure(s) performed (including Critical Care):  Procedures   ____________________________________________   INITIAL IMPRESSION / ASSESSMENT AND PLAN / ED COURSE  Patient is coronavirus positive and has a low-grade fever.  Tremors seem to improve over time.  She also has a small UTI.  We will get a urine culture and give her some Rocephin.    Patient is COVID positive.  We have been waiting to get a hold of [name] to transfer her so she can get dialysis.  We have not been able to get a hold of them yet.  Patient remained stable.   Jamie Arias was evaluated in Emergency Department on 08/23/2018 for the symptoms described in the history of present illness. She was evaluated in the context of the global COVID-19 pandemic, which necessitated consideration that the patient might be at risk for infection with the SARS-CoV-2 virus that causes COVID-19. Institutional protocols and algorithms that pertain to the evaluation of patients at risk for COVID-19 are in a state of rapid change based on information released by regulatory bodies including the CDC and federal and state organizations. These policies and algorithms were followed during the patient's care in the ED.  ____________________________________________   FINAL CLINICAL IMPRESSION(S) / ED DIAGNOSES  Final diagnoses:  Urinary tract infection without hematuria, site unspecified  Real time reverse transcriptase PCR positive for COVID-19 virus  Weakness  Fever, unspecified fever cause     ED Discharge Orders    None       Note:  This document was prepared using Dragon voice recognition software and may include unintentional dictation errors.    Nena Polio, MD 08/23/18 1515     Nena Polio, MD 08/23/18 1600

## 2018-08-23 NOTE — H&P (Signed)
History and Physical    Jamie Arias AST:[id number] DOB: [birth year] DOA: 08/23/2018  PCP: [name], MD Patient coming from: [facility]  Chief Complaint: Missed dialysis  HPI: Jamie Arias is a 63 y.o. female with medical history significant of ESRD on HD MWF, anemia, chronic diastolic congestive heart failure, CAD status post PCI, type 2 diabetes, hypertension, hyperlipidemia, hypothyroidism, and conditions listed below presenting as a transfer from [facility]  COVID-19 positive.  Patient transferred to [facility] for management of COVID-19 and hemodialysis.  Patient states her last dialysis was on Monday, June 15 and since then she has not been able to go because she is not feeling well.  Reports having generalized weakness.  Reports having intermittent tremors for the past few months.  Denies family history of tremors.  Denies any fevers, chills, chest pain, shortness of breath, cough, nausea, vomiting, abdominal pain, diarrhea, dysuria, or urinary frequency/urgency.  No additional history could be obtained from her.  ED Course: Temperature 100.7 F. CBC: White count 5.0, hemoglobin 9.5, platelet count 121, absolute lymphocyte count low Metabolic panel: Sodium 989, potassium 5.1, chloride 88, bicarb 24, BUN 77, creatinine 15.71, glucose 153 LFTs: AST 28, ALT 16, alk phos 45, T bili 0.9 Troponin 0.03 UA: Negative nitrite, small amount of leukocytes, 11-20 WBCs, and no bacteria on microscopic examination. Ammonia level 20 Lactic acid 1.3 Head CT negative for acute intracranial finding. Chest x-ray showing mild stable cardiomegaly and no active cardiopulmonary disease. Received Rocephin in the ED.   Review of Systems:  All systems reviewed and apart from history of presenting illness, are negative.  Past Medical History:  Diagnosis Date  . Anemia   . Chronic diastolic CHF (congestive heart failure) ([place])    a. Due to ischemic cardiomyopathy. EF as low as 35%,  improved to normal s/p CABG; b. echo 07/06/13: EF 55-60%, no RWMAs, mod TR, trivial pericardial effusion not c/w tamponade physiology;  c. 10/2015 Echo: EF 65%, Gr1 DD, triv AI, mild MR, mildly dil LA, mod TR, PASP 11mHg.  .Marland KitchenCoronary artery disease    a. NSTEMI 06/2013; b.cath: severe three-vessel CAD w/ EF 30% & mild-mod MR; c. s/p 3 vessel CABG 07/02/13 (LIMA-LAD, SVG-OM, and SVG-RPDA);  d. 10/2015 MV: no ischemia/infarct.  . Diabetes mellitus without complication ([facility]   . Diabetic neuropathy ([place]   . Dialysis patient ([facility]    MWF  . ESRD (end stage renal disease) ([facility]    a. 12/2015 initiated - mwf dialysis.  .Marland KitchenGERD (gastroesophageal reflux disease)   . Hyperlipidemia   . Hypertension   . Hypothyroidism   . [place]ocardial infarction ([place] 2015  . Neuropathy   . Pleural effusion 2015  . Pulmonary hypertension ([facility]   . Wears dentures    full lower    Past Surgical History:  Procedure Laterality Date  . A/V FISTULAGRAM Right 12/17/2016   Procedure: A/V Fistulagram;  Surgeon: DAlgernon Huxley MD;  [facility];  Service: Cardiovascular;  Laterality: Right;  . A/V FISTULAGRAM Right 01/07/2017   Procedure: A/V Fistulagram;  Surgeon: DAlgernon Huxley MD;  [place];  Service: Cardiovascular;  Laterality: Right;  . A/V FISTULAGRAM Right 12/03/2017   Procedure: A/V FISTULAGRAM;  Surgeon: SKatha Cabal MD;  [place];  Service: Cardiovascular;  Laterality: Right;  . A/V SHUNT INTERVENTION N/A 02/22/2017   Procedure: A/V SHUNT INTERVENTION;  Surgeon: DAlgernon Huxley MD;  [facility];  Service: Cardiovascular;  Laterality: N/A;  . AV FISTULA PLACEMENT Right 02/03/2016   Procedure: INSERTION OF ARTERIOVENOUS (AV) GORE-TEX GRAFT ARM ( BRACH / AXILLARY );  Surgeon: Katha Cabal, MD;  Location: [facility];  Service: Vascular;  Laterality: Right;  . CARDIAC CATHETERIZATION    . CATARACT EXTRACTION Bilateral   .  CHOLECYSTECTOMY N/A 12/09/2014   Procedure: LAPAROSCOPIC CHOLECYSTECTOMY;  Surgeon: Marlyce [name], MD;  Location: [facility];  Service: General;  Laterality: N/A;  . CORONARY ARTERY BYPASS GRAFT N/A 07/02/2013   Procedure: CORONARY ARTERY BYPASS GRAFTING (CABG);  Surgeon: Ivin [name], MD;  Location: [place];  Service: Open Heart Surgery;  Laterality: N/A;  CABG x three, using left internal mammary artery and right leg greater saphenous vein harvested endoscopically  . ESOPHAGOGASTRODUODENOSCOPY (EGD) WITH PROPOFOL N/A 11/24/2015   Procedure: ESOPHAGOGASTRODUODENOSCOPY (EGD) WITH PROPOFOL;  Surgeon: [name], MD;  Location: [place];  Service: Endoscopy;  Laterality: N/A;  Diabetic - insulin  . EYE SURGERY Bilateral    Cataract Extraction with IOL  . INTRAOPERATIVE TRANSESOPHAGEAL ECHOCARDIOGRAM N/A 07/02/2013   Procedure: INTRAOPERATIVE TRANSESOPHAGEAL ECHOCARDIOGRAM;  Surgeon: Ivin [name], MD;  Location: [place];  Service: Open Heart Surgery;  Laterality: N/A;  . PERIPHERAL VASCULAR CATHETERIZATION Right 12/06/2015   Procedure: Dialysis/Perma Catheter Insertion;  Surgeon: Katha Cabal, MD;  Location: [place] [facility];  Service: Cardiovascular;  Laterality: Right;  . PERIPHERAL VASCULAR THROMBECTOMY Right 09/28/2016   Procedure: Peripheral Vascular Thrombectomy;  Surgeon: Katha Cabal, MD;  Location: [place] [facility];  Service: Cardiovascular;  Laterality: Right;  . PERIPHERAL VASCULAR THROMBECTOMY Right 05/20/2018   Procedure: PERIPHERAL VASCULAR THROMBECTOMY;  Surgeon: Katha Cabal, MD;  Location: [place] [facility];  Service: Cardiovascular;  Laterality: Right;  . PORTA CATH REMOVAL N/A 06/01/2016   Procedure: Glori [name] Cath Removal;  Surgeon: Katha Cabal, MD;  Location: [place] [facility];  Service: Cardiovascular;  Laterality: N/A;  . THORACENTESIS Left 2015     reports that she has never smoked. She has never used smokeless tobacco. She  reports that she does not drink alcohol or use drugs.  Allergies  Allergen Reactions  . Nsaids Other (See Comments)    Contraindicated due to kidney disease.  Marland Kitchen Doxycycline Other (See Comments)    tremor    Family History  Problem Relation Age of Onset  . COPD Mother   . Cancer Mother        Lung  . Pulmonary embolism Father   . Diabetes Father   . Diabetes Paternal Grandfather   . Heart disease Maternal Grandmother   . Colon cancer Neg Hx   . Colon polyps Neg Hx   . Esophageal cancer Neg Hx   . Pancreatic cancer Neg Hx   . Liver disease Neg Hx     Prior to Admission medications   Medication Sig Start Date End Date Taking? Authorizing Provider  ACCU-CHEK FASTCLIX LANCETS MISC Check blood glucose twice daily, diagnosis code E11.9 12/13/16   [name], MD  acetaminophen (TYLENOL) 325 MG tablet Take 650 mg by mouth daily as needed for moderate pain or headache.     [provider]  Alirocumab (PRALUENT) 75 MG/ML SOAJ Inject 75 mg into the skin every 14 (fourteen) days. 04/04/18   Wellington Hampshire, MD  aspirin EC 81 MG tablet Take 81 mg by mouth daily.    [provider]  atorvastatin (LIPITOR) 80 MG tablet Take 1 tablet (80 mg total)  by mouth daily at 6 PM. 07/11/18   Wellington Hampshire, MD  blood glucose meter kit and supplies KIT Dispense based on patient and insurance preference. Use up to twice daily. (FOR ICD-10 E11.9) 07/01/15   Rubbie Battiest, RN  Blood Glucose Monitoring Suppl (ACCU-CHEK AVIVA PLUS) w/Device KIT Use as directed to test blood sugar up to three times daily 05/06/17   [name], MD  Calcium Acetate 668 (169 Ca) MG TABS TAKE 3 TABLETS BY MOUTH THREE TIMES DAILY WITH MEALS 09/04/17   [provider]  ergocalciferol (VITAMIN D2) 50000 units capsule Take 50,000 Units by mouth once a week.     [provider]  ferrous sulfate 325 (65 FE) MG tablet Take 325 mg by mouth daily with breakfast.    [provider]   fluticasone (FLONASE) 50 MCG/ACT nasal spray Place 2 sprays into both nostrils daily. Patient not taking: Reported on 08/14/2018 09/17/17   [name], MD  furosemide (LASIX) 20 MG tablet Take 20 mg by mouth every other day. Non-dialysis days    [provider]  gabapentin (NEURONTIN) 300 MG capsule TAKE 1 CAPSULE BY MOUTH THREE TIMES DAILY 07/11/18   [name], MD  glucose blood (ACCU-CHEK AVIVA) test strip Use as instructed to test blood sugar up to 3 times daily 05/06/17   [name], MD  Insulin Glargine (BASAGLAR KWIKPEN) 100 UNIT/ML SOPN Inject 0.4 mLs (40 Units total) into the skin daily. 08/14/18   [name], MD  Insulin Pen Needle (GLOBAL EASE INJECT PEN NEEDLES) 31G X 5 MM MISC Inject 0.1 mLs (10 Units total) into the skin at bedtime 01/12/16   [name], MD  Lancets (ACCU-CHEK SOFT TOUCH) lancets Use as instructed 05/06/17   [name], MD  Lancets Misc. (ACCU-CHEK SOFTCLIX LANCET DEV) KIT Use as directed to test blood sugar up to three times daily 05/06/17   [name], MD  levothyroxine (SYNTHROID) 75 MCG tablet TAKE 1 TABLET BY MOUTH ONCE DAILY OFFICE  VISIT  NEEDED  FOR  FURTHER  REFILLS 06/21/18   [name], MD  lidocaine-prilocaine (EMLA) cream Apply 1 application topically as needed (port access).    [provider]  midodrine (PROAMATINE) 10 MG tablet Take one tablet by mouth twice a day on Monday, Wednesday, Friday 05/06/18   Wellington Hampshire, MD  midodrine (PROAMATINE) 5 MG tablet Take 1 tablet by mouth Twice a day Tuesday, Thursday, Saturday, Sunday. 04/04/18   Wellington Hampshire, MD  nitroGLYCERIN (NITROSTAT) 0.4 MG SL tablet DISSOLVE ONE TABLET UNDER THE TONGUE EVERY 5 MINUTES AS NEEDED FOR CHEST PAIN.  DO NOT EXCEED A TOTAL OF 3 DOSES IN 15 MINUTES 08/18/18   Wellington Hampshire, MD  omeprazole (PRILOSEC) 20 MG capsule Take 20 mg by mouth 2 (two) times daily before a meal.    [provider]   ondansetron (ZOFRAN-ODT) 4 MG disintegrating tablet Take 4 mg by mouth every 8 (eight) hours as needed for nausea or vomiting.  08/23/17   [provider]  traZODone (DESYREL) 50 MG tablet TAKE 1 TABLET BY MOUTH AT BEDTIME 07/21/18   [name], MD    Physical Exam: Vitals:   08/23/18 2244 08/24/18 0002 08/24/18 0020 08/24/18 0021  BP: (!) 118/55     Pulse: 67     Resp: 20     Temp: 99.8 F (37.7 C)     TempSrc: Oral  SpO2: 100% 97% (!) 85% 95%  Weight: 96.6 kg     Height: '5\' 4"'$  (1.626 m)       Physical Exam  Constitutional: She is oriented to person, place, and time. She appears well-developed and well-nourished. No distress.  Bilateral upper and lower extremity tremors  HENT:  Head: Normocephalic.  Intermittent twitching of lower face noted  Eyes: EOM are normal. Right eye exhibits no discharge. Left eye exhibits no discharge.  Neck: Neck supple.  Cardiovascular: Normal rate, regular rhythm and intact distal pulses.  Pulmonary/Chest: Effort normal and breath sounds normal. No respiratory distress. She has no wheezes. She has no rales.  Abdominal: Soft. Bowel sounds are normal. She exhibits no distension. There is no abdominal tenderness. There is no guarding.  Musculoskeletal:        General: No edema.  Neurological: She is alert and oriented to person, place, and time. No cranial nerve deficit.  Speech fluent, tongue midline, no facial droop Strength 5 out of 5 in bilateral upper and lower extremities Sensation to light touch intact throughout  Skin: Skin is warm and dry. She is not diaphoretic.     Labs on Admission: I have personally reviewed following labs and imaging studies  CBC: Recent Labs  Lab 08/23/18 1325  WBC 5.0  NEUTROABS 4.1  HGB 9.5*  HCT 29.3*  MCV 99.0  PLT 952*   Basic Metabolic Panel: Recent Labs  Lab 08/23/18 0912  NA 132*  K 5.1  CL 88*  CO2 24  GLUCOSE 153*  BUN 77*  CREATININE 15.71*  CALCIUM 7.2*   GFR:  Estimated Creatinine Clearance: 4.1 mL/min (A) (by C-G formula based on SCr of 15.71 mg/dL (H)). Liver Function Tests: Recent Labs  Lab 08/23/18 0912  AST 28  ALT 16  ALKPHOS 45  BILITOT 0.9  PROT 6.8  ALBUMIN 3.3*   No results for input(s): LIPASE, AMYLASE in the last 168 hours. Recent Labs  Lab 08/23/18 1103  AMMONIA 20   Coagulation Profile: No results for input(s): INR, PROTIME in the last 168 hours. Cardiac Enzymes: Recent Labs  Lab 08/23/18 0912 08/23/18 1325 08/24/18 0134  TROPONINI 0.03* 0.03* 0.03*   BNP (last 3 results) No results for input(s): PROBNP in the last 8760 hours. HbA1C: No results for input(s): HGBA1C in the last 72 hours. CBG: Recent Labs  Lab 08/23/18 2148 08/23/18 2230  GLUCAP 65* 135*   Lipid Profile: No results for input(s): CHOL, HDL, LDLCALC, TRIG, CHOLHDL, LDLDIRECT in the last 72 hours. Thyroid Function Tests: No results for input(s): TSH, T4TOTAL, FREET4, T3FREE, THYROIDAB in the last 72 hours. Anemia Panel: Recent Labs    08/24/18 0134  FERRITIN 2,852*   Urine analysis:    Component Value Date/Time   COLORURINE YELLOW (A) 08/23/2018 1209   APPEARANCEUR CLOUDY (A) 08/23/2018 1209   APPEARANCEUR Hazy 07/23/2013 0741   LABSPEC 1.016 08/23/2018 1209   LABSPEC 1.016 07/23/2013 0741   PHURINE 5.0 08/23/2018 1209   GLUCOSEU NEGATIVE 08/23/2018 1209   GLUCOSEU Negative 07/23/2013 0741   HGBUR SMALL (A) 08/23/2018 1209   BILIRUBINUR NEGATIVE 08/23/2018 1209   BILIRUBINUR Negative 07/23/2013 0741   KETONESUR NEGATIVE 08/23/2018 1209   PROTEINUR 100 (A) 08/23/2018 1209   UROBILINOGEN 0.2 06/30/2013 2213   NITRITE NEGATIVE 08/23/2018 1209   LEUKOCYTESUR SMALL (A) 08/23/2018 1209   LEUKOCYTESUR Negative 07/23/2013 0741    Radiological Exams on Admission: Ct Head Wo Contrast  Result Date: 08/23/2018 CLINICAL DATA:  Dizziness and weakness with  tremors. Dialysis patient. EXAM: CT HEAD WITHOUT CONTRAST TECHNIQUE: Contiguous  axial images were obtained from the base of the skull through the vertex without intravenous contrast. COMPARISON:  09/04/2017 FINDINGS: Brain: Ventricles, cisterns and other CSF spaces are within normal. There is no mass, mass effect, shift of midline structures or acute hemorrhage. There is mild chronic ischemic microvascular disease present. Vascular: No hyperdense vessel or unexpected calcification. Skull: Normal. Negative for fracture or focal lesion. Sinuses/Orbits: Orbits are normal. Minimal mucosal membrane thickening over the paranasal sinuses. Mastoid air cells are clear. Other: None. IMPRESSION: No acute findings. Mild chronic ischemic microvascular disease. Minimal chronic sinus inflammatory change. Electronically Signed   By: Marin Olp M.D.   On: 08/23/2018 12:18   Dg Chest Portable 1 View  Result Date: 08/23/2018 CLINICAL DATA:  Dizziness and weakness.  Dialysis. EXAM: PORTABLE CHEST 1 VIEW COMPARISON:  09/04/2017 FINDINGS: Lungs are adequately inflated without lobar consolidation or effusion. Mild stable cardiomegaly. Remainder of the exam is unchanged. IMPRESSION: No acute cardiopulmonary disease. Mild stable cardiomegaly. Electronically Signed   By: Marin Olp M.D.   On: 08/23/2018 10:25   Assessment/Plan Principal Problem:   COVID-19 virus infection Active Problems:   HTN (hypertension)   ESRD (end stage renal disease) (HCC)   Acute on chronic respiratory failure with hypoxia (HCC)   UTI (urinary tract infection)  Acute hypoxic respiratory failure secondary to COVID-19 viral infection Temperature 100.7 F at outside hospital.  COVID-19 positive.  Oxygen saturation in the mid 80s and placed on 2 L supplemental oxygen. Chest x-ray showing mild stable cardiomegaly and no active cardiopulmonary disease.  No leukocytosis.  Absolute lymphocyte count low.  Inflammatory markers elevated. -Airborne and contact precautions -Start Solu-Medrol 50 mg every 12 hours -Continuous pulse ox  -Chest CT. If positive for findings consistent with COVID-19, start Remdesivir. -Blood culture x2 pending  End-stage renal disease, missed dialysis Does not appear significantly volume overloaded on exam.  Chest x-ray without evidence of pulmonary edema.  Potassium 5.1 and bicarb 24. -Consult nephrology in a.m. for dialysis  Mildly elevated troponin in the setting of end-stage renal disease Troponin 0.03 >0.03 >0.03.  Patient denies any chest pain and appears comfortable on exam.  ?UTI UA with negative nitrite, small amount of leukocytes, 11-20 WBCs, and no bacteria on microscopic examination.  Patient denies any UTI symptoms.  Received a dose of ceftriaxone. -Continue ceftriaxone at this time.  Urine culture pending.  Chronic anemia in the setting of end-stage renal disease -Hemoglobin 9.5.  No signs of active bleeding.  Continue to monitor.  Thrombocytopenia -Platelet count 121.  No signs of active bleeding.  Continue to monitor.  Tremors Patient reports having intermittent tremors for the past few months.  No history of ethanol use to suggest alcohol withdrawal. -Outpatient follow-up  Type 2 diabetes -Sliding scale insulin and CBG checks.  Hypertension -Normotensive.  Continue to monitor.  Physical deconditioning -PT evaluation   DVT prophylaxis: Subcutaneous heparin Code Status: Patient wishes to be full code. Family Communication: No family available. Disposition Plan: Anticipate discharge after clinical improvement. Consults called: None  Admission status: It is my clinical opinion that admission to INPATIENT is reasonable and necessary in this 63 y.o. female . presenting after missed dialysis sessions.  Found to have acute hypoxic respiratory failure secondary to COVID-19 viral infection.  Patient will need serial dialysis and treatment for COVID-19 viral infection.  Given the aforementioned, the predictability of an adverse outcome is felt to be significant. I  expect that  the patient will require at least 2 midnights in the hospital to treat this condition.   The medical decision making on this patient was of high complexity and the patient is at high risk for clinical deterioration, therefore this is a level 3 visit.  Shela Leff MD [facility] Pager [phone]  If 7PM-7AM, please contact night-coverage [email or web address] Password [facility]  08/24/2018, 5:19 AM

## 2018-08-23 NOTE — ED Notes (Signed)
Patient updated with patients planned admission and positive Covid with patients permission.

## 2018-08-23 NOTE — ED Triage Notes (Signed)
Pt from home with c/o weakness, dizziness and tremors. Pt last had dialysis Monday.

## 2018-08-23 NOTE — ED Notes (Signed)
Signature pad not working at this time. Patient signed paper consent form for transfer and placed on chart.

## 2018-08-23 NOTE — Progress Notes (Signed)
Pt arrived to room 2W30 via EMS. Dextrose was given during transport so CBG obtained on arrival: 135. VSS. Pt oriented to room and call bell. Spouse contacted and updated on location and phone number. Awaiting admitting orders.

## 2018-08-24 ENCOUNTER — Encounter (HOSPITAL_COMMUNITY): Payer: Self-pay

## 2018-08-24 ENCOUNTER — Inpatient Hospital Stay (HOSPITAL_COMMUNITY): Payer: Medicare Other

## 2018-08-24 DIAGNOSIS — U071 COVID-19: Secondary | ICD-10-CM | POA: Diagnosis present

## 2018-08-24 DIAGNOSIS — J9621 Acute and chronic respiratory failure with hypoxia: Secondary | ICD-10-CM

## 2018-08-24 DIAGNOSIS — N390 Urinary tract infection, site not specified: Secondary | ICD-10-CM

## 2018-08-24 DIAGNOSIS — I10 Essential (primary) hypertension: Secondary | ICD-10-CM

## 2018-08-24 LAB — C-REACTIVE PROTEIN: CRP: 12.6 mg/dL — ABNORMAL HIGH (ref <1.0)

## 2018-08-24 LAB — LACTATE DEHYDROGENASE: LDH: 214 U/L — ABNORMAL HIGH (ref 98–192)

## 2018-08-24 LAB — PROCALCITONIN: Procalcitonin: 0.36 ng/mL

## 2018-08-24 LAB — GLUCOSE, CAPILLARY
Glucose-Capillary: 148 mg/dL — ABNORMAL HIGH (ref 70–99)
Glucose-Capillary: 241 mg/dL — ABNORMAL HIGH (ref 70–99)
Glucose-Capillary: 274 mg/dL — ABNORMAL HIGH (ref 70–99)
Glucose-Capillary: 305 mg/dL — ABNORMAL HIGH (ref 70–99)

## 2018-08-24 LAB — D-DIMER, QUANTITATIVE: D-Dimer, Quant: 1.26 ug/mL-FEU — ABNORMAL HIGH (ref 0.00–0.50)

## 2018-08-24 LAB — FIBRINOGEN: Fibrinogen: 572 mg/dL — ABNORMAL HIGH (ref 210–475)

## 2018-08-24 LAB — HIV ANTIBODY (ROUTINE TESTING W REFLEX): HIV Screen 4th Generation wRfx: NONREACTIVE

## 2018-08-24 LAB — TROPONIN I: Troponin I: 0.03 ng/mL (ref <0.03)

## 2018-08-24 LAB — FERRITIN: Ferritin: 2852 ng/mL — ABNORMAL HIGH (ref 11–307)

## 2018-08-24 LAB — MRSA PCR SCREENING: MRSA by PCR: NEGATIVE

## 2018-08-24 MED ORDER — PANTOPRAZOLE SODIUM 40 MG PO TBEC
40.0000 mg | DELAYED_RELEASE_TABLET | Freq: Every day | ORAL | Status: DC
  Administered 2018-08-24 – 2018-09-15 (×23): 40 mg via ORAL
  Filled 2018-08-24 (×23): qty 1

## 2018-08-24 MED ORDER — INSULIN ASPART 100 UNIT/ML ~~LOC~~ SOLN
0.0000 [IU] | Freq: Three times a day (TID) | SUBCUTANEOUS | Status: DC
  Administered 2018-08-24 (×2): 3 [IU] via SUBCUTANEOUS
  Administered 2018-08-24: 1 [IU] via SUBCUTANEOUS
  Administered 2018-08-25 (×2): 2 [IU] via SUBCUTANEOUS
  Administered 2018-08-25: 9 [IU] via SUBCUTANEOUS
  Administered 2018-08-26: 5 [IU] via SUBCUTANEOUS
  Administered 2018-08-26: 9 [IU] via SUBCUTANEOUS
  Administered 2018-08-26 – 2018-08-27 (×2): 5 [IU] via SUBCUTANEOUS
  Administered 2018-08-27: 2 [IU] via SUBCUTANEOUS
  Administered 2018-08-28 (×2): 5 [IU] via SUBCUTANEOUS
  Administered 2018-08-28: 2 [IU] via SUBCUTANEOUS
  Administered 2018-08-29: 5 [IU] via SUBCUTANEOUS
  Administered 2018-08-29: 1 [IU] via SUBCUTANEOUS
  Administered 2018-08-29: 2 [IU] via SUBCUTANEOUS
  Administered 2018-08-30: 3 [IU] via SUBCUTANEOUS
  Administered 2018-08-30: 2 [IU] via SUBCUTANEOUS
  Administered 2018-08-30: 3 [IU] via SUBCUTANEOUS
  Administered 2018-08-31: 5 [IU] via SUBCUTANEOUS
  Administered 2018-08-31 (×2): 3 [IU] via SUBCUTANEOUS
  Administered 2018-09-01: 2 [IU] via SUBCUTANEOUS
  Administered 2018-09-01: 3 [IU] via SUBCUTANEOUS
  Administered 2018-09-01: 7 [IU] via SUBCUTANEOUS
  Administered 2018-09-02: 1 [IU] via SUBCUTANEOUS
  Administered 2018-09-02: 2 [IU] via SUBCUTANEOUS
  Administered 2018-09-02 – 2018-09-03 (×2): 1 [IU] via SUBCUTANEOUS
  Administered 2018-09-04 (×2): 2 [IU] via SUBCUTANEOUS
  Administered 2018-09-05 (×2): 1 [IU] via SUBCUTANEOUS
  Administered 2018-09-06: 2 [IU] via SUBCUTANEOUS
  Administered 2018-09-06 – 2018-09-14 (×5): 1 [IU] via SUBCUTANEOUS
  Administered 2018-09-14 (×2): 2 [IU] via SUBCUTANEOUS

## 2018-08-24 MED ORDER — SODIUM CHLORIDE 0.9 % IV SOLN
1.0000 g | INTRAVENOUS | Status: AC
  Administered 2018-08-26 – 2018-08-27 (×2): 1 g via INTRAVENOUS
  Filled 2018-08-24 (×2): qty 1
  Filled 2018-08-24: qty 10

## 2018-08-24 MED ORDER — VITAMIN C 500 MG PO TABS
500.0000 mg | ORAL_TABLET | Freq: Every day | ORAL | Status: DC
  Administered 2018-08-24 – 2018-09-15 (×22): 500 mg via ORAL
  Filled 2018-08-24 (×23): qty 1

## 2018-08-24 MED ORDER — GABAPENTIN 300 MG PO CAPS
600.0000 mg | ORAL_CAPSULE | Freq: Three times a day (TID) | ORAL | Status: DC
  Administered 2018-08-24 – 2018-08-25 (×5): 600 mg via ORAL
  Filled 2018-08-24 (×5): qty 2

## 2018-08-24 MED ORDER — BASAGLAR KWIKPEN 100 UNIT/ML ~~LOC~~ SOPN: 40.0000 [IU] | PEN_INJECTOR | Freq: Every day | SUBCUTANEOUS | Status: DC

## 2018-08-24 MED ORDER — HEPARIN SODIUM (PORCINE) 5000 UNIT/ML IJ SOLN
5000.0000 [IU] | Freq: Three times a day (TID) | INTRAMUSCULAR | Status: DC
  Administered 2018-08-24 – 2018-09-15 (×64): 5000 [IU] via SUBCUTANEOUS
  Filled 2018-08-24 (×64): qty 1

## 2018-08-24 MED ORDER — INSULIN ASPART 100 UNIT/ML ~~LOC~~ SOLN
0.0000 [IU] | Freq: Every day | SUBCUTANEOUS | Status: DC
  Administered 2018-08-24 – 2018-08-26 (×2): 4 [IU] via SUBCUTANEOUS
  Administered 2018-08-31: 3 [IU] via SUBCUTANEOUS

## 2018-08-24 MED ORDER — ZINC SULFATE 220 (50 ZN) MG PO CAPS
220.0000 mg | ORAL_CAPSULE | Freq: Every day | ORAL | Status: DC
  Administered 2018-08-24 – 2018-09-15 (×22): 220 mg via ORAL
  Filled 2018-08-24 (×23): qty 1

## 2018-08-24 MED ORDER — FERROUS SULFATE 325 (65 FE) MG PO TABS
325.0000 mg | ORAL_TABLET | Freq: Every day | ORAL | Status: DC
  Administered 2018-08-24 – 2018-09-01 (×9): 325 mg via ORAL
  Filled 2018-08-24 (×10): qty 1

## 2018-08-24 MED ORDER — TRAZODONE HCL 50 MG PO TABS
50.0000 mg | ORAL_TABLET | Freq: Every day | ORAL | Status: DC
  Administered 2018-08-24 – 2018-08-27 (×4): 50 mg via ORAL
  Filled 2018-08-24 (×4): qty 1

## 2018-08-24 MED ORDER — ATORVASTATIN CALCIUM 80 MG PO TABS
80.0000 mg | ORAL_TABLET | Freq: Every day | ORAL | Status: DC
  Administered 2018-08-24 – 2018-09-14 (×22): 80 mg via ORAL
  Filled 2018-08-24 (×22): qty 1

## 2018-08-24 MED ORDER — CHLORHEXIDINE GLUCONATE CLOTH 2 % EX PADS
6.0000 | MEDICATED_PAD | Freq: Every day | CUTANEOUS | Status: DC
  Administered 2018-08-27 – 2018-08-28 (×2): 6 via TOPICAL

## 2018-08-24 MED ORDER — METHYLPREDNISOLONE SODIUM SUCC 125 MG IJ SOLR
50.0000 mg | Freq: Two times a day (BID) | INTRAMUSCULAR | Status: DC
  Administered 2018-08-24 – 2018-09-03 (×21): 50 mg via INTRAVENOUS
  Filled 2018-08-24 (×21): qty 2

## 2018-08-24 MED ORDER — ASPIRIN EC 81 MG PO TBEC
81.0000 mg | DELAYED_RELEASE_TABLET | Freq: Every day | ORAL | Status: DC
  Administered 2018-08-24 – 2018-09-15 (×23): 81 mg via ORAL
  Filled 2018-08-24 (×23): qty 1

## 2018-08-24 MED ORDER — SODIUM CHLORIDE 0.9 % IV SOLN: 500.0000 mg | INTRAVENOUS | Status: DC

## 2018-08-24 MED ORDER — FUROSEMIDE 40 MG PO TABS
40.0000 mg | ORAL_TABLET | ORAL | Status: DC
  Administered 2018-08-24: 40 mg via ORAL
  Filled 2018-08-24: qty 1

## 2018-08-24 NOTE — Progress Notes (Signed)
PROGRESS NOTE    Jamie Arias  UXN:[id number] DOB: August 30, 1955 DOA: 08/23/2018 PCP: [name], MD    Brief Narrative:  63 y.o. female with medical history significant of ESRD on HD MWF, anemia, chronic diastolic congestive heart failure, CAD status post PCI, type 2 diabetes, hypertension, hyperlipidemia, hypothyroidism, and conditions listed below presenting as a transfer from [facility].  COVID-19 positive.  Patient transferred to [facility] for management of COVID-19 and hemodialysis.  Patient states her last dialysis was on Monday, June 15 and since then she has not been able to go because she is not feeling well.  Reports having generalized weakness.  Reports having intermittent tremors for the past few months.  Denies family history of tremors.  Denies any fevers, chills, chest pain, shortness of breath, cough, nausea, vomiting, abdominal pain, diarrhea, dysuria, or urinary frequency/urgency.  No additional history could be obtained from her.  Assessment & Plan:   Principal Problem:   COVID-19 virus infection Active Problems:   HTN (hypertension)   ESRD (end stage renal disease) (HCC)   Acute on chronic respiratory failure with hypoxia (HCC)   UTI (urinary tract infection)  Acute hypoxic respiratory failure secondary to COVID-19 viral infection Temperature 100.7 F at outside hospital.  COVID-19 positive.  Oxygen saturation in the mid 80s and placed on 2 L supplemental oxygen. Chest x-ray showing mild stable cardiomegaly and no active cardiopulmonary disease.  No leukocytosis.  Absolute lymphocyte count low.  Inflammatory markers elevated. -Airborne and contact precautions -Continued on Solu-Medrol 50 mg every 12 hours -Cont to wean O2 as tolerated -Chest CT reviewed. Patchy opacities noted worrisome for COVID pna. On 2LNC. Will -Blood culture x2 pending  End-stage renal disease, missed dialysis -Does not appear significantly volume overloaded on exam.  Chest x-ray  without evidence of pulmonary edema.  Potassium 5.1 and bicarb 24. -Have consulted Nephrology. Plan for HD in AM  Mildly elevated troponin in the setting of end-stage renal disease -Troponin 0.03 >0.03 >0.03.   -Stable. Denies chest pain.  UTI UA with negative nitrite, small amount of leukocytes, 11-20 WBCs, and no bacteria on microscopic examination.  Patient denies any UTI symptoms.  Received a dose of ceftriaxone. -Continue ceftriaxone at this time.  Follow urine culture  Chronic anemia in the setting of end-stage renal disease -Hemoglobin 9.5.  No signs of active bleeding.   -Repeat CBC in AM  Thrombocytopenia -Platelet count 121.  No signs of active bleeding.   -Repeat CBC in AM  Tremors Patient reports having intermittent tremors for the past few months.  No history of ethanol use to suggest alcohol withdrawal. -Recommend cont outpatient work up  Type 2 diabetes -Sliding scale insulin and CBG checks.  Hypertension -Normotensive.  Continue to monitor. -Stable at this time  Physical deconditioning -PT/OT consulted  DVT prophylaxis: Heparin subq Code Status: Full Family Communication: Pt in room, family not present Disposition Plan: Uncertain at this time  Consultants:   Nephrology  Procedures:     Antimicrobials: Anti-infectives (From admission, onward)   Start     Dose/Rate Route Frequency Ordered Stop   08/25/18 1000  cefTRIAXone (ROCEPHIN) 1 g in sodium chloride 0.9 % 100 mL IVPB     1 g 200 mL/hr over 30 Minutes Intravenous Every 24 hours 08/24/18 0022     08/24/18 0500  azithromycin (ZITHROMAX) 500 mg in sodium chloride 0.9 % 250 mL IVPB  Status:  Discontinued     500 mg 250 mL/hr over 60 Minutes Intravenous Every  24 hours 08/24/18 0453 08/24/18 0454       Subjective: Without complaints at this time  Objective: Vitals:   08/24/18 0020 08/24/18 0021 08/24/18 0815 08/24/18 1630  BP:   (!) 122/53 (!) 116/55  Pulse:   83 64  Resp:   16  16  Temp:   98.5 F (36.9 C) 99.2 F (37.3 C)  TempSrc:   Oral Oral  SpO2: (!) 85% 95% 98% 99%  Weight:      Height:        Intake/Output Summary (Last 24 hours) at 08/24/2018 1848 Last data filed at 08/24/2018 1800 Gross per 24 hour  Intake 960 ml  Output 0 ml  Net 960 ml   Filed Weights   08/23/18 2244  Weight: 96.6 kg    Examination:  General exam: Appears calm and comfortable  Respiratory system: Clear to auscultation. Respiratory effort normal. Cardiovascular system: S1 & S2 heard, RRR. No JVD, murmurs, rubs, gallops or clicks. No pedal edema. Gastrointestinal system: Abdomen is nondistended, soft and nontender. No organomegaly or masses felt. Normal bowel sounds heard. Central nervous system: Alert and oriented. No focal neurological deficits. Extremities: Symmetric 5 x 5 power. Skin: No rashes, lesions or ulcers Psychiatry: Judgement and insight appear normal. Mood & affect appropriate.   Data Reviewed: I have personally reviewed following labs and imaging studies  CBC: Recent Labs  Lab 08/23/18 1325  WBC 5.0  NEUTROABS 4.1  HGB 9.5*  HCT 29.3*  MCV 99.0  PLT 258*   Basic Metabolic Panel: Recent Labs  Lab 08/23/18 0912  NA 132*  K 5.1  CL 88*  CO2 24  GLUCOSE 153*  BUN 77*  CREATININE 15.71*  CALCIUM 7.2*   GFR: Estimated Creatinine Clearance: 4.1 mL/min (A) (by C-G formula based on SCr of 15.71 mg/dL (H)). Liver Function Tests: Recent Labs  Lab 08/23/18 0912  AST 28  ALT 16  ALKPHOS 45  BILITOT 0.9  PROT 6.8  ALBUMIN 3.3*   No results for input(s): LIPASE, AMYLASE in the last 168 hours. Recent Labs  Lab 08/23/18 1103  AMMONIA 20   Coagulation Profile: No results for input(s): INR, PROTIME in the last 168 hours. Cardiac Enzymes: Recent Labs  Lab 08/23/18 0912 08/23/18 1325 08/24/18 0134  TROPONINI 0.03* 0.03* 0.03*   BNP (last 3 results) No results for input(s): PROBNP in the last 8760 hours. HbA1C: No results for  input(s): HGBA1C in the last 72 hours. CBG: Recent Labs  Lab 08/23/18 2148 08/23/18 2230 08/24/18 0813 08/24/18 1222 08/24/18 1628  GLUCAP 65* 135* 148* 241* 274*   Lipid Profile: No results for input(s): CHOL, HDL, LDLCALC, TRIG, CHOLHDL, LDLDIRECT in the last 72 hours. Thyroid Function Tests: No results for input(s): TSH, T4TOTAL, FREET4, T3FREE, THYROIDAB in the last 72 hours. Anemia Panel: Recent Labs    08/24/18 0134  FERRITIN 2,852*   Sepsis Labs: Recent Labs  Lab 08/23/18 1053 08/24/18 0134  PROCALCITON  --  0.36  LATICACIDVEN 1.3  --     Recent Results (from the past 240 hour(s))  SARS Coronavirus 2 (CEPHEID - Performed in [facility] hospital lab), Hosp Order     Status: Abnormal   Collection Time: 08/23/18  9:12 AM   Specimen: Nasopharyngeal Swab  Result Value Ref Range Status   SARS Coronavirus 2 POSITIVE (A) NEGATIVE Final    Comment: RESULT CALLED TO, READ BACK BY AND VERIFIED WITH: KIM GAULT AT 1117 08/23/2018.PMF (NOTE) If result is NEGATIVE SARS-CoV-2 target nucleic  acids are NOT DETECTED. The SARS-CoV-2 RNA is generally detectable in upper and lower  respiratory specimens during the acute phase of infection. The lowest  concentration of SARS-CoV-2 viral copies this assay can detect is 250  copies / mL. A negative result does not preclude SARS-CoV-2 infection  and should not be used as the sole basis for treatment or other  patient management decisions.  A negative result may occur with  improper specimen collection / handling, submission of specimen other  than nasopharyngeal swab, presence of viral mutation(s) within the  areas targeted by this assay, and inadequate number of viral copies  (<250 copies / mL). A negative result must be combined with clinical  observations, patient history, and epidemiological information. If result is POSITIVE SARS-CoV-2 target nucleic acids are DETECTED. The S ARS-CoV-2 RNA is generally detectable in upper and  lower  respiratory specimens during the acute phase of infection.  Positive  results are indicative of active infection with SARS-CoV-2.  Clinical  correlation with patient history and other diagnostic information is  necessary to determine patient infection status.  Positive results do  not rule out bacterial infection or co-infection with other viruses. If result is PRESUMPTIVE POSTIVE SARS-CoV-2 nucleic acids MAY BE PRESENT.   A presumptive positive result was obtained on the submitted specimen  and confirmed on repeat testing.  While 2019 novel coronavirus  (SARS-CoV-2) nucleic acids may be present in the submitted sample  additional confirmatory testing may be necessary for epidemiological  and / or clinical management purposes  to differentiate between  SARS-CoV-2 and other Sarbecovirus currently known to infect humans.  If clinically indicated additional testing with an alternate test  methodology [id number]) is adv ised. The SARS-CoV-2 RNA is generally  detectable in upper and lower respiratory specimens during the acute  phase of infection. The expected result is Negative. Fact Sheet for Patients:  StrictlyIdeas.no Fact Sheet for Healthcare Providers: BankingDealers.co.za This test is not yet approved or cleared by the [place] FDA and has been authorized for detection and/or diagnosis of SARS-CoV-2 by FDA under an Emergency Use Authorization (EUA).  This EUA will remain in effect (meaning this test can be used) for the duration of the COVID-19 declaration under Section 564(b)(1) of the Act, 21 U.S.C. section 360bbb-3(b)(1), unless the authorization is terminated or revoked sooner. Performed at [facility], [address] [place], [place] [address]   Culture, blood (routine x 2)     Status: None (Preliminary result)   Collection Time: 08/23/18  9:13 AM   Specimen: BLOOD  Result Value Ref Range Status   Specimen  Description BLOOD L WRIST  Final   Special Requests   Final    BOTTLES DRAWN AEROBIC AND ANAEROBIC Blood Culture results may not be optimal due to an inadequate volume of blood received in culture bottles   Culture   Final    NO GROWTH < 24 HOURS Performed at [facility], [address] [place], [place] [address]    Report Status PENDING  Incomplete  Culture, blood (routine x 2)     Status: None (Preliminary result)   Collection Time: 08/23/18  9:13 AM   Specimen: BLOOD  Result Value Ref Range Status   Specimen Description BLOOD L HAND  Final   Special Requests   Final    BOTTLES DRAWN AEROBIC AND ANAEROBIC Blood Culture results may not be optimal due to an inadequate volume of blood received in culture bottles   Culture  Final    NO GROWTH < 24 HOURS Performed at [facility], [address] [place], [place] [address]    Report Status PENDING  Incomplete  MRSA PCR Screening     Status: None   Collection Time: 08/23/18 11:45 PM   Specimen: Nasopharyngeal  Result Value Ref Range Status   MRSA by PCR NEGATIVE NEGATIVE Final    Comment:        The GeneXpert MRSA Assay (FDA approved for NASAL specimens only), is one component of a comprehensive MRSA colonization surveillance program. It is not intended to diagnose MRSA infection nor to guide or monitor treatment for MRSA infections. Performed at [facility], [place] [address], [place], [place] [address]      Radiology Studies: Ct Head Wo Contrast  Result Date: 08/23/2018 CLINICAL DATA:  Dizziness and weakness with tremors. Dialysis patient. EXAM: CT HEAD WITHOUT CONTRAST TECHNIQUE: Contiguous axial images were obtained from the base of the skull through the vertex without intravenous contrast. COMPARISON:  09/04/2017 FINDINGS: Brain: Ventricles, cisterns and other CSF spaces are within normal. There is no mass, mass effect, shift of midline structures or acute hemorrhage. There is mild chronic  ischemic microvascular disease present. Vascular: No hyperdense vessel or unexpected calcification. Skull: Normal. Negative for fracture or focal lesion. Sinuses/Orbits: Orbits are normal. Minimal mucosal membrane thickening over the paranasal sinuses. Mastoid air cells are clear. Other: None. IMPRESSION: No acute findings. Mild chronic ischemic microvascular disease. Minimal chronic sinus inflammatory change. Electronically Signed   By: Marin Olp M.D.   On: 08/23/2018 12:18   Ct Chest Wo Contrast  Result Date: 08/24/2018 CLINICAL DATA:  COVID-19 positive, dyspnea EXAM: CT CHEST WITHOUT CONTRAST TECHNIQUE: Multidetector CT imaging of the chest was performed following the standard protocol without IV contrast. COMPARISON:  Chest radiograph from one day prior. 10/23/2015 chest CT. FINDINGS: Cardiovascular: Top-normal heart size. No significant pericardial effusion/thickening. Three-vessel coronary atherosclerosis status post CABG. Atherosclerotic nonaneurysmal thoracic aorta. Dilated main pulmonary artery (3.7 cm diameter). Partially visualized right axillary and proximal right upper extremity venous stents. Mediastinum/Nodes: No discrete thyroid nodules. Unremarkable esophagus. No pathologically enlarged axillary, mediastinal or hilar lymph nodes, noting limited sensitivity for the detection of hilar adenopathy on this noncontrast study. Lungs/Pleura: No pneumothorax. No pleural effusion. Chronic rounded atelectasis in dependent basilar left lower lobe adjacent to mild left pleural thickening. Patchy ground-glass opacities throughout both lungs involving all lung lobes, most prominent in the left upper lobe. No central airway stenoses. No discrete lung masses or significant pulmonary nodules. Upper abdomen: Cholecystectomy. Musculoskeletal: No aggressive appearing focal osseous lesions. Intact sternotomy wires. Moderate thoracic spondylosis. IMPRESSION: 1. Patchy ground-glass opacities throughout both lungs  involving all lung lobes, most prominent in the left upper lobe, compatible with known COVID-19 pneumonia. There are a spectrum of findings in the lungs which can be seen with acute atypical infection (as well as other non-infectious etiologies). In particular, viral pneumonia (including COVID-19) should be considered in the appropriate clinical setting. 2. Chronic rounded atelectasis in the dependent left lung base with chronic smooth left pleural thickening. 3. Dilated main pulmonary artery, suggesting pulmonary arterial hypertension. Aortic Atherosclerosis (ICD10-I70.0). Electronically Signed   By: Ilona Sorrel M.D.   On: 08/24/2018 08:33   Dg Chest Portable 1 View  Result Date: 08/23/2018 CLINICAL DATA:  Dizziness and weakness.  Dialysis. EXAM: PORTABLE CHEST 1 VIEW COMPARISON:  09/04/2017 FINDINGS: Lungs are adequately inflated without lobar consolidation or effusion. Mild stable cardiomegaly. Remainder of the exam is  unchanged. IMPRESSION: No acute cardiopulmonary disease. Mild stable cardiomegaly. Electronically Signed   By: Marin Olp M.D.   On: 08/23/2018 10:25    Scheduled Meds: . aspirin EC  81 mg Oral Daily  . atorvastatin  80 mg Oral q1800  . [START ON 08/25/2018] Chlorhexidine Gluconate Cloth  6 each Topical Q0600  . [START ON 08/25/2018] ferrous sulfate  325 mg Oral Q breakfast  . furosemide  40 mg Oral Once per day on Sun Tue Thu Sat  . gabapentin  600 mg Oral TID  . heparin  5,000 Units Subcutaneous Q8H  . insulin aspart  0-5 Units Subcutaneous QHS  . insulin aspart  0-9 Units Subcutaneous TID WC  . mouth rinse  15 mL Mouth Rinse BID  . methylPREDNISolone (SOLU-MEDROL) injection  50 mg Intravenous Q12H  . pantoprazole  40 mg Oral Daily  . traZODone  50 mg Oral QHS  . vitamin C  500 mg Oral Daily  . zinc sulfate  220 mg Oral Daily   Continuous Infusions: . [START ON 08/25/2018] cefTRIAXone (ROCEPHIN)  IV       LOS: 1 day   Marylu Lund, MD [facility] Pager On  Amion  If 7PM-7AM, please contact night-coverage 08/24/2018, 6:48 PM

## 2018-08-24 NOTE — Evaluation (Signed)
Physical Therapy Evaluation Patient Details Name: Jamie Arias MRN: [id number] DOB: 02/15/1956 Today's Date: 08/24/2018   History of Present Illness  Jamie Arias is a 63 y.o. female with medical history significant of ESRD on HD MWF, anemia, chronic diastolic congestive heart failure, CAD status post PCI, type 2 diabetes, hypertension, hyperlipidemia, hypothyroidism, and conditions listed below presenting as a transfer from [facility].  COVID-19 positive.  Patient transferred to [facility] for management of COVID-19 and hemodialysis.   Clinical Impression   Pt admitted with above diagnosis. Pt currently with functional limitations due to the deficits listed below (see PT Problem List). Was managing with help from husband and son, able to manage the 8 steps to enter her home, and get to and from HD consistently until this illness; Tells me that the "tremors" she is experiencing have come and go -- these involuntary, dyskinetic movements resemble choreo-athetiod-like movement; she had a run of this movement problem about a year ago, and now these involuntary movements have returned; Presents with afore-mentioned involuntary movements, decr activity tolerance, incr need for supplemental O2, incr fallrisk;  Pt will benefit from skilled PT to increase their independence and safety with mobility to allow discharge to the venue listed below.    Post-acute rehab will be excellent for her -- CIR would be optimal; to be able to go to CIR, though, she will need 2 negative Covid tests (I will place a CIR screen to get her on their radar); SNF is also an option; I also noted that she is [place], and the [facility] is also a possible option.  Worth considering a Neurology consult as well.     Follow Up Recommendations CIR;Other (comment)(Post-acute Rehabilitation)    Equipment Recommendations  Rolling walker with 5" wheels;3in1 (PT)    Recommendations for Other Services OT consult(Will  order per protocol)     Precautions / Restrictions Precautions Precautions: Fall Precaution Comments: Dyskinesia-like movement, large tremors      Mobility  Bed Mobility Overal bed mobility: Needs Assistance Bed Mobility: Supine to Sit;Sit to Supine     Supine to sit: Mod assist Sit to supine: Min assist   General bed mobility comments: Heavy mod assist to help elevate trunk to sit; min assis to help LEs back into bed  Transfers Overall transfer level: Needs assistance Equipment used: Rolling walker (2 wheeled) Transfers: Sit to/from Stand Sit to Stand: Mod assist         General transfer comment: Heavy mod assist to power up and steady; cues for hand placement; assist to control descent to sit; stood for approx 10-15 seconds before she requested to sit due to dizziness; consider orthostatics next session; noting writhing-type movements of trunk standing within RW  Ambulation/Gait             General Gait Details: Unable today  Stairs            Wheelchair Mobility    Modified Rankin (Stroke Patients Only)       Balance Overall balance assessment: Needs assistance;History of Falls Sitting-balance support: Bilateral upper extremity supported Sitting balance-Leahy Scale: Fair       Standing balance-Leahy Scale: Poor Standing balance comment: Dependent on external support                             Pertinent Vitals/Pain Pain Assessment: No/denies pain    Home Living Family/patient expects to be discharged to:: Private  residence Living Arrangements: Spouse/significant other Available Help at Discharge: Family;Available 24 hours/day;Other (Comment)(Spouse with recent back surgery) Type of Home: Mobile home Home Access: Stairs to enter Entrance Stairs-Rails: Right(tells me she holds on to her husband or son) Technical brewer of Steps: 8 Home Layout: One level   Additional Comments: Need to find out about bathroom setup     Prior Function Level of Independence: Needs assistance   Gait / Transfers Assistance Needed: Tells me she often holds on to her husband when walking and ascending/descending stairs  ADL's / Homemaking Assistance Needed: Need more information about ADLs        Hand Dominance        Extremity/Trunk Assessment   Upper Extremity Assessment Upper Extremity Assessment: Generalized weakness(large tremor/writhing-type UE movement bilaterally)    Lower Extremity Assessment Lower Extremity Assessment: Generalized weakness(large tremor/writhing-type LE movement bilaterally)    Cervical / Trunk Assessment Cervical / Trunk Assessment: Other exceptions Cervical / Trunk Exceptions: Noting truncal ataxia in standing  Communication      Cognition Arousal/Alertness: Awake/alert Behavior During Therapy: WFL for tasks assessed/performed Overall Cognitive Status: Within Functional Limits for tasks assessed                                        General Comments General comments (skin integrity, edema, etc.): Session conducted on 2 L supplemental O2 and sats ranged from 86% to 95%; in bed at end of session, 92%    Exercises     Assessment/Plan    PT Assessment Patient needs continued PT services  PT Problem List Decreased strength;Decreased range of motion;Decreased activity tolerance;Decreased balance;Decreased mobility;Decreased coordination;Decreased knowledge of use of DME;Decreased safety awareness;Decreased knowledge of precautions;Cardiopulmonary status limiting activity;Impaired tone       PT Treatment Interventions DME instruction;Gait training;Stair training;Functional mobility training;Therapeutic activities;Therapeutic exercise;Balance training;Neuromuscular re-education;Patient/family education    PT Goals (Current goals can be found in the Care Plan section)  Acute Rehab PT Goals Patient Stated Goal: Hopes to get better and eventually start peritoneal  dialysis PT Goal Formulation: With patient Time For Goal Achievement: 09/07/18 Potential to Achieve Goals: Good    Frequency Min 3X/week   Barriers to discharge Other (comment) I believe we cannot consider CIR for post-acute rehab until she is Covid negative x2    Co-evaluation               AM-PAC PT "6 Clicks" Mobility  Outcome Measure Help needed turning from your back to your side while in a flat bed without using bedrails?: A Little Help needed moving from lying on your back to sitting on the side of a flat bed without using bedrails?: A Lot Help needed moving to and from a bed to a chair (including a wheelchair)?: A Lot Help needed standing up from a chair using your arms (e.g., wheelchair or bedside chair)?: A Lot Help needed to walk in hospital room?: A Lot Help needed climbing 3-5 steps with a railing? : Total 6 Click Score: 12    End of Session Equipment Utilized During Treatment: Gait belt Activity Tolerance: Patient tolerated treatment well Patient left: in bed;with call bell/phone within reach Nurse Communication: Mobility status PT Visit Diagnosis: Unsteadiness on feet (R26.81);Other abnormalities of gait and mobility (R26.89);Other symptoms and signs involving the nervous system (R29.898);Dizziness and giddiness (R42);History of falling (Z91.81)    Time: 1455-1530 PT Time Calculation (min) (ACUTE ONLY): 35  min   Charges:   PT Evaluation $PT Eval Moderate Complexity: 1 Mod PT Treatments $Therapeutic Activity: 8-22 mins        Roney Marion, PT  Acute Rehabilitation Services Pager [phone] Office [phone]   Colletta [place] 08/24/2018, 4:18 PM

## 2018-08-24 NOTE — Progress Notes (Signed)
  Chaplain delivered AD materials with note to front desk with directions for RN that pt contact [place] if she would like help completing the material.    Luana Shu [phone]     08/24/18 2000  Clinical Encounter Type  Visited With Patient not available  Visit Type Initial;Other (Comment) (AD)  Referral From Patient  Consult/Referral To Chaplain  Stress Factors  Patient Stress Factors Health changes

## 2018-08-24 NOTE — Consult Note (Signed)
[facility]    NEPHROLOGY CONSULTATION NOTE  PATIENT ID:  Jamie Arias, DOB:  January 03, 1956  HPI: The patient is a 63 y.o. year old female patient transferred from [facility] for management of end-stage renal disease.  She has a history of end-stage renal disease on hemodialysis Monday, Wednesday, Friday, anemia, chronic diastolic congestive heart failure, coronary artery disease status post PCI, type 2 diabetes, hypertension, hyperlipidemia, hypothyroidism, and anemia.  Her last dialysis session was on Monday, June 15 and since then she has not been able to go to dialysis because she has not felt well.  Her son went to pick her up for dialysis on and found her on the floor.  She reportedly had intermittent tremors for the past few months as well.  History was obtained via the chart.   Past Medical History:  Diagnosis Date  . Anemia   . Chronic diastolic CHF (congestive heart failure) ([place])    a. Due to ischemic cardiomyopathy. EF as low as 35%, improved to normal s/p CABG; b. echo 07/06/13: EF 55-60%, no RWMAs, mod TR, trivial pericardial effusion not c/w tamponade physiology;  c. 10/2015 Echo: EF 65%, Gr1 DD, triv AI, mild MR, mildly dil LA, mod TR, PASP 17mHg.  .Marland KitchenCoronary artery disease    a. NSTEMI 06/2013; b.cath: severe three-vessel CAD w/ EF 30% & mild-mod MR; c. s/p 3 vessel CABG 07/02/13 (LIMA-LAD, SVG-OM, and SVG-RPDA);  d. 10/2015 MV: no ischemia/infarct.  . Diabetes mellitus without complication ([place]   . Diabetic neuropathy ([place]   . Dialysis patient ([facility]    MWF  . ESRD (end stage renal disease) ([place]    a. 12/2015 initiated - mwf dialysis.  .Marland KitchenGERD (gastroesophageal reflux disease)   . Hyperlipidemia   . Hypertension   . Hypothyroidism   . Myocardial infarction ([place] 2015  . Neuropathy   . Pleural effusion 2015  . Pulmonary hypertension ([place]   . Wears dentures    full lower    Past Surgical History:  Procedure Laterality Date  . A/V FISTULAGRAM  Right 12/17/2016   Procedure: A/V Fistulagram;  Surgeon: DAlgernon Huxley MD;  [place];  Service: Cardiovascular;  Laterality: Right;  . A/V FISTULAGRAM Right 01/07/2017   Procedure: A/V Fistulagram;  Surgeon: DAlgernon Huxley MD;  [place] [facility];  Service: Cardiovascular;  Laterality: Right;  . A/V FISTULAGRAM Right 12/03/2017   Procedure: A/V FISTULAGRAM;  Surgeon: SKatha Cabal MD;  [place] [facility];  Service: Cardiovascular;  Laterality: Right;  . A/V SHUNT INTERVENTION N/A 02/22/2017   Procedure: A/V SHUNT INTERVENTION;  Surgeon: DAlgernon Huxley MD;  [place];  Service: Cardiovascular;  Laterality: N/A;  . AV FISTULA PLACEMENT Right 02/03/2016   Procedure: INSERTION OF ARTERIOVENOUS (AV) GORE-TEX GRAFT ARM ( BRACH / AXILLARY );  Surgeon: GKatha Cabal MD;  Location: [facility] OR[facility];  Service: Vascular;  Laterality: Right;  . CARDIAC CATHETERIZATION    . CATARACT EXTRACTION Bilateral   . CHOLECYSTECTOMY N/A 12/09/2014   Procedure: LAPAROSCOPIC CHOLECYSTECTOMY;  Surgeon: CMarlyce [name] MD;  Location: [facility] OR[facility];  Service: General;  Laterality: N/A;  . CORONARY ARTERY BYPASS GRAFT N/A 07/02/2013   Procedure: CORONARY ARTERY BYPASS GRAFTING (CABG);  Surgeon: PIvin [name] MD;  [place]  Service: Open Heart Surgery;  Laterality: N/A;  CABG x three, using left internal mammary artery and right leg greater saphenous vein harvested endoscopically  . ESOPHAGOGASTRODUODENOSCOPY (EGD) WITH  PROPOFOL N/A 11/24/2015   Procedure: ESOPHAGOGASTRODUODENOSCOPY (EGD) WITH PROPOFOL;  Surgeon: [name], MD;  Location: [place];  Service: Endoscopy;  Laterality: N/A;  Diabetic - insulin  . EYE SURGERY Bilateral    Cataract Extraction with IOL  . INTRAOPERATIVE TRANSESOPHAGEAL ECHOCARDIOGRAM N/A 07/02/2013   Procedure: INTRAOPERATIVE TRANSESOPHAGEAL ECHOCARDIOGRAM;  Surgeon: Ivin [name], MD;  Location: [place];   Service: Open Heart Surgery;  Laterality: N/A;  . PERIPHERAL VASCULAR CATHETERIZATION Right 12/06/2015   Procedure: Dialysis/Perma Catheter Insertion;  Surgeon: Katha Cabal, MD;  Location: [place] [facility] LAB;  Service: Cardiovascular;  Laterality: Right;  . PERIPHERAL VASCULAR THROMBECTOMY Right 09/28/2016   Procedure: Peripheral Vascular Thrombectomy;  Surgeon: Katha Cabal, MD;  Location: [place] CV LAB;  Service: Cardiovascular;  Laterality: Right;  . PERIPHERAL VASCULAR THROMBECTOMY Right 05/20/2018   Procedure: PERIPHERAL VASCULAR THROMBECTOMY;  Surgeon: Katha Cabal, MD;  Location: [place] CV LAB;  Service: Cardiovascular;  Laterality: Right;  . PORTA CATH REMOVAL N/A 06/01/2016   Procedure: Glori [name] Cath Removal;  Surgeon: Katha Cabal, MD;  Location: [place] CV [facility];  Service: Cardiovascular;  Laterality: N/A;  . THORACENTESIS Left 2015    Family History  Problem Relation Age of Onset  . COPD Mother   . Cancer Mother        Lung  . Pulmonary embolism Father   . Diabetes Father   . Diabetes Paternal Grandfather   . Heart disease Maternal Grandmother   . Colon cancer Neg Hx   . Colon polyps Neg Hx   . Esophageal cancer Neg Hx   . Pancreatic cancer Neg Hx   . Liver disease Neg Hx     Social History   Tobacco Use  . Smoking status: Never Smoker  . Smokeless tobacco: Never Used  Substance Use Topics  . Alcohol use: No    Alcohol/week: 0.0 standard drinks  . Drug use: No    REVIEW OF SYSTEMS: Remainder of 10 point review of systems was negative per the review of the chart with the exception of documented findings in the HPI.    PHYSICAL EXAM:  Vitals:   08/24/18 0021 08/24/18 0815  BP:  (!) 122/53  Pulse:  83  Resp:  16  Temp:  98.5 F (36.9 C)  SpO2: 95% 98%   No intake/output data recorded.   General:  AAOx3 NAD HEENT: MMM [place] AT anicteric sclera Neck:  No JVD, no adenopathy CV:  Heart RRR  Lungs:  L/S CTA  bilaterally Abd:  abd SNT/ND with normal BS GU:  Bladder non-palpable Extremities:  No LE edema. Skin:  No skin rash Psych:  normal mood and affect Neuro:  no focal deficits, bilateral upper and lower extremity tremors   CURRENT MEDICATIONS:  . aspirin EC  81 mg Oral Daily  . atorvastatin  80 mg Oral q1800  . [START ON 08/25/2018] ferrous sulfate  325 mg Oral Q breakfast  . furosemide  40 mg Oral Once per day on Sun Tue Thu Sat  . gabapentin  600 mg Oral TID  . heparin  5,000 Units Subcutaneous Q8H  . insulin aspart  0-5 Units Subcutaneous QHS  . insulin aspart  0-9 Units Subcutaneous TID WC  . mouth rinse  15 mL Mouth Rinse BID  . methylPREDNISolone (SOLU-MEDROL) injection  50 mg Intravenous Q12H  . pantoprazole  40 mg Oral Daily  . traZODone  50 mg Oral QHS  . vitamin C  500 mg Oral  Daily  . zinc sulfate  220 mg Oral Daily     HOME MEDICATIONS:  Prior to Admission medications   Medication Sig Start Date End Date Taking? Authorizing Provider  acetaminophen (TYLENOL) 325 MG tablet Take 650 mg by mouth daily as needed for moderate pain or headache.    Yes [provider]  Alirocumab (PRALUENT) 75 MG/ML SOAJ Inject 75 mg into the skin every 14 (fourteen) days. 04/04/18  Yes Wellington Hampshire, MD  aspirin EC 81 MG tablet Take 81 mg by mouth daily.   Yes [provider]  atorvastatin (LIPITOR) 80 MG tablet Take 1 tablet (80 mg total) by mouth daily at 6 PM. 07/11/18  Yes Wellington Hampshire, MD  Calcium Acetate 668 (169 Ca) MG TABS Take 2,004 mg by mouth 3 (three) times daily with meals.  09/04/17  Yes [provider]  ergocalciferol (VITAMIN D2) 50000 units capsule Take 50,000 Units by mouth once a week.    Yes [provider]  ferrous sulfate 325 (65 FE) MG tablet Take 325 mg by mouth daily with breakfast.   Yes [provider]  fluticasone (FLONASE) 50 MCG/ACT nasal spray Place 2 sprays into both nostrils daily. 09/17/17  Yes [name], MD  furosemide (LASIX) 20 MG tablet Take 40 mg by mouth every other day. Non-dialysis days    Yes [provider]  gabapentin (NEURONTIN) 300 MG capsule TAKE 1 CAPSULE BY MOUTH THREE TIMES DAILY Patient taking differently: Take 600 mg by mouth at bedtime.  07/11/18  Yes [name], MD  Insulin Glargine (BASAGLAR KWIKPEN) 100 UNIT/ML SOPN Inject 0.4 mLs (40 Units total) into the skin daily. 08/14/18  Yes [name], MD  levothyroxine (SYNTHROID) 75 MCG tablet TAKE 1 TABLET BY MOUTH ONCE DAILY OFFICE  VISIT  NEEDED  FOR  FURTHER  REFILLS 06/21/18  Yes [name], MD  lidocaine-prilocaine (EMLA) cream Apply 1 application topically as needed (port access).   Yes [provider]  midodrine (PROAMATINE) 10 MG tablet Take one tablet by mouth twice a day on Monday, Wednesday, Friday 05/06/18  Yes Wellington Hampshire, MD  nitroGLYCERIN (NITROSTAT) 0.4 MG SL tablet DISSOLVE ONE TABLET UNDER THE TONGUE EVERY 5 MINUTES AS NEEDED FOR CHEST PAIN.  DO NOT EXCEED A TOTAL OF 3 DOSES IN 15 MINUTES Patient taking differently: Place 0.4 mg under the tongue every 5 (five) minutes as needed for chest pain. DISSOLVE ONE TABLET UNDER THE TONGUE EVERY 5 MINUTES AS NEEDED FOR CHEST PAIN.  DO NOT EXCEED A TOTAL OF 3 DOSES IN 15 MINUTES 08/18/18  Yes Wellington Hampshire, MD  omeprazole (PRILOSEC) 20 MG capsule Take 20 mg by mouth 2 (two) times daily before a meal.   Yes [provider]  ondansetron (ZOFRAN-ODT) 4 MG disintegrating tablet Take 4 mg by mouth every 8 (eight) hours as needed for nausea or vomiting.  08/23/17  Yes [provider]  traZODone (DESYREL) 50 MG tablet TAKE 1 TABLET BY MOUTH AT BEDTIME 07/21/18  Yes [name], MD  ACCU-CHEK FASTCLIX LANCETS MISC Check blood glucose twice daily, diagnosis code E11.9 12/13/16   [name], MD  blood glucose meter kit and supplies KIT Dispense based on patient and insurance preference. Use up to twice daily.  (FOR ICD-10 E11.9) 07/01/15   Rubbie Battiest, RN  Blood Glucose Monitoring Suppl (ACCU-CHEK AVIVA PLUS) w/Device KIT Use as directed to test blood sugar up to three times daily  05/06/17   [name], MD  glucose blood (ACCU-CHEK AVIVA) test strip Use as instructed to test blood sugar up to 3 times daily 05/06/17   [name], MD  Insulin Pen Needle (GLOBAL EASE INJECT PEN NEEDLES) 31G X 5 MM MISC Inject 0.1 mLs (10 Units total) into the skin at bedtime 01/12/16   [name], MD  Lancets (ACCU-CHEK SOFT TOUCH) lancets Use as instructed 05/06/17   [name], MD  Lancets Misc. (ACCU-CHEK SOFTCLIX LANCET DEV) KIT Use as directed to test blood sugar up to three times daily 05/06/17   [name], MD  midodrine (PROAMATINE) 5 MG tablet Take 1 tablet by mouth Twice a day Tuesday, Thursday, Saturday, Sunday. Patient not taking: Reported on 08/24/2018 04/04/18   Wellington Hampshire, MD       LABS:  CBC Latest Ref Rng & Units 08/23/2018 09/05/2017 09/04/2017  WBC 4.0 - 10.5 K/uL 5.0 6.2 7.5  Hemoglobin 12.0 - 15.0 g/dL 9.5(L) 12.1 12.5  Hematocrit 36.0 - 46.0 % 29.3(L) 35.3 36.8  Platelets 150 - 400 K/uL 121(L) 212 209    CMP Latest Ref Rng & Units 08/23/2018 05/20/2018 02/11/2018  Glucose 70 - 99 mg/dL 153(H) - -  BUN 8 - 23 mg/dL 77(H) - -  Creatinine 0.44 - 1.00 mg/dL 15.71(H) - -  Sodium 135 - 145 mmol/L 132(L) - -  Potassium 3.5 - 5.1 mmol/L 5.1 5.2(H) -  Chloride 98 - 111 mmol/L 88(L) - -  CO2 22 - 32 mmol/L 24 - -  Calcium 8.9 - 10.3 mg/dL 7.2(L) - -  Total Protein 6.5 - 8.1 g/dL 6.8 - 6.4  Total Bilirubin 0.3 - 1.2 mg/dL 0.9 - 0.3  Alkaline Phos 38 - 126 U/L 45 - 77  AST 15 - 41 U/L 28 - 13  ALT 0 - 44 U/L 16 - 13    Lab Results  Component Value Date   PTH 135 (H) 02/22/2017   CALCIUM 7.2 (L) 08/23/2018   CAION 1.20 07/03/2013   PHOS 8.7 (H) 09/04/2017       Component Value Date/Time   COLORURINE YELLOW (A) 08/23/2018 1209   APPEARANCEUR CLOUDY (A)  08/23/2018 1209   APPEARANCEUR Hazy 07/23/2013 0741   LABSPEC 1.016 08/23/2018 1209   LABSPEC 1.016 07/23/2013 0741   PHURINE 5.0 08/23/2018 1209   GLUCOSEU NEGATIVE 08/23/2018 1209   GLUCOSEU Negative 07/23/2013 0741   HGBUR SMALL (A) 08/23/2018 1209   BILIRUBINUR NEGATIVE 08/23/2018 1209   BILIRUBINUR Negative 07/23/2013 0741   KETONESUR NEGATIVE 08/23/2018 1209   PROTEINUR 100 (A) 08/23/2018 1209   UROBILINOGEN 0.2 06/30/2013 2213   NITRITE NEGATIVE 08/23/2018 1209   LEUKOCYTESUR SMALL (A) 08/23/2018 1209   LEUKOCYTESUR Negative 07/23/2013 0741      Component Value Date/Time   PHART 7.371 07/02/2013 1808   PCO2ART 42.9 07/02/2013 1808   PO2ART 79.0 (L) 07/02/2013 1808   HCO3 25.4 (H) 07/02/2013 1903   TCO2 24 07/03/2013 1754   O2SAT 95.0 07/02/2013 1903       Component Value Date/Time   IRON 64 08/11/2015 0945   FERRITIN 2,852 (H) 08/24/2018 0134   IRONPCTSAT 23.8 08/11/2015 0945       ASSESSMENT/PLAN:     1.  End-stage renal disease.  We will plan for dialysis in the morning.  Volume status and electrolytes are stable.  2.  COVID-19 positive.  Is being treated with oxygen, Solu-Medrol, and is being considered for remdesivir.  Will defer  to primary team.  3.  Possible urinary tract infection.  Is being treated for urinary tract infection with Rocephin pending culture.  4.  Anemia of chronic disease.  Likely secondary to end-stage renal disease.  Hemoglobin is 9.5.  Likely low in the setting of current infection.  Will not likely respond well to ESA.  5.  Type 2 diabetes.  Continue Accu-Cheks and a sliding scale insulin.  6.  Hypertension.  Stable.  Continue current therapy.     [place], DO, [place]

## 2018-08-25 LAB — FERRITIN: Ferritin: 3164 ng/mL — ABNORMAL HIGH (ref 11–307)

## 2018-08-25 LAB — CBC
HCT: 27.6 % — ABNORMAL LOW (ref 36.0–46.0)
Hemoglobin: 9.2 g/dL — ABNORMAL LOW (ref 12.0–15.0)
MCH: 31.9 pg (ref 26.0–34.0)
MCHC: 33.3 g/dL (ref 30.0–36.0)
MCV: 95.8 fL (ref 80.0–100.0)
Platelets: 147 10*3/uL — ABNORMAL LOW (ref 150–400)
RBC: 2.88 MIL/uL — ABNORMAL LOW (ref 3.87–5.11)
RDW: 14.1 % (ref 11.5–15.5)
WBC: 6.3 10*3/uL (ref 4.0–10.5)
nRBC: 0 % (ref 0.0–0.2)

## 2018-08-25 LAB — RENAL FUNCTION PANEL
Albumin: 2.8 g/dL — ABNORMAL LOW (ref 3.5–5.0)
Anion gap: 24 — ABNORMAL HIGH (ref 5–15)
BUN: 117 mg/dL — ABNORMAL HIGH (ref 8–23)
CO2: 17 mmol/L — ABNORMAL LOW (ref 22–32)
Calcium: 7 mg/dL — ABNORMAL LOW (ref 8.9–10.3)
Chloride: 88 mmol/L — ABNORMAL LOW (ref 98–111)
Creatinine, Ser: 19.53 mg/dL — ABNORMAL HIGH (ref 0.44–1.00)
GFR calc Af Amer: 2 mL/min — ABNORMAL LOW (ref >60)
GFR calc non Af Amer: 2 mL/min — ABNORMAL LOW (ref >60)
Glucose, Bld: 474 mg/dL — ABNORMAL HIGH (ref 70–99)
Phosphorus: 10 mg/dL — ABNORMAL HIGH (ref 2.5–4.6)
Potassium: 6.1 mmol/L — ABNORMAL HIGH (ref 3.5–5.1)
Sodium: 129 mmol/L — ABNORMAL LOW (ref 135–145)

## 2018-08-25 LAB — GLUCOSE, CAPILLARY
Glucose-Capillary: 460 mg/dL — ABNORMAL HIGH (ref 70–99)
Glucose-Capillary: 165 mg/dL — ABNORMAL HIGH (ref 70–99)
Glucose-Capillary: 153 mg/dL — ABNORMAL HIGH (ref 70–99)
Glucose-Capillary: 170 mg/dL — ABNORMAL HIGH (ref 70–99)
Glucose-Capillary: 186 mg/dL — ABNORMAL HIGH (ref 70–99)

## 2018-08-25 LAB — URINE CULTURE: Culture: NO GROWTH

## 2018-08-25 LAB — C-REACTIVE PROTEIN: CRP: 11.3 mg/dL — ABNORMAL HIGH (ref <1.0)

## 2018-08-25 MED ORDER — GABAPENTIN 300 MG PO CAPS
600.0000 mg | ORAL_CAPSULE | Freq: Every day | ORAL | Status: DC
  Administered 2018-08-26 – 2018-08-27 (×2): 600 mg via ORAL
  Filled 2018-08-25 (×2): qty 2

## 2018-08-25 MED ORDER — LIDOCAINE HCL (PF) 1 % IJ SOLN: 5.0000 mL | INTRAMUSCULAR | Status: DC | PRN

## 2018-08-25 MED ORDER — MIDODRINE HCL 5 MG PO TABS
10.0000 mg | ORAL_TABLET | Freq: Two times a day (BID) | ORAL | Status: DC
  Administered 2018-08-25 – 2018-08-26 (×3): 10 mg via ORAL
  Filled 2018-08-25 (×3): qty 2

## 2018-08-25 MED ORDER — LEVOTHYROXINE SODIUM 75 MCG PO TABS
75.0000 ug | ORAL_TABLET | Freq: Every day | ORAL | Status: DC
  Administered 2018-08-26 – 2018-08-28 (×3): 75 ug via ORAL
  Filled 2018-08-25 (×3): qty 1

## 2018-08-25 MED ORDER — PENTAFLUOROPROP-TETRAFLUOROETH EX AERO: 1.0000 "application " | INHALATION_SPRAY | CUTANEOUS | Status: DC | PRN

## 2018-08-25 MED ORDER — INSULIN GLARGINE 100 UNIT/ML ~~LOC~~ SOLN
40.0000 [IU] | Freq: Every day | SUBCUTANEOUS | Status: DC
  Administered 2018-08-25 – 2018-08-26 (×2): 40 [IU] via SUBCUTANEOUS
  Filled 2018-08-25 (×2): qty 0.4

## 2018-08-25 MED ORDER — INSULIN ASPART 100 UNIT/ML ~~LOC~~ SOLN
10.0000 [IU] | Freq: Once | SUBCUTANEOUS | Status: AC
  Administered 2018-08-25: 10 [IU] via SUBCUTANEOUS

## 2018-08-25 MED ORDER — LIDOCAINE-PRILOCAINE 2.5-2.5 % EX CREA
1.0000 "application " | TOPICAL_CREAM | CUTANEOUS | Status: DC | PRN
  Filled 2018-08-25: qty 5

## 2018-08-25 MED ORDER — DEXTROSE 50 % IV SOLN
INTRAVENOUS | Status: AC
  Filled 2018-08-25: qty 50

## 2018-08-25 MED ORDER — SODIUM CHLORIDE 0.9 % IV SOLN: 100.0000 mL | INTRAVENOUS | Status: DC | PRN

## 2018-08-25 MED ORDER — GLUCAGON HCL RDNA (DIAGNOSTIC) 1 MG IJ SOLR
INTRAMUSCULAR | Status: AC
  Filled 2018-08-25: qty 1

## 2018-08-25 MED ORDER — SODIUM CHLORIDE 0.9 % IV SOLN
100.0000 mL | INTRAVENOUS | Status: DC | PRN
  Administered 2018-08-27: 100 mL via INTRAVENOUS

## 2018-08-25 NOTE — Progress Notes (Signed)
[facility]    NEPHROLOGY PROGRESS NOTE  SUBJECTIVE: Patient underwent dialysis today.  Had a syncopal episode near the end of dialysis.  Discussed with dialysis nurse and hospitalist at length.  Otherwise, no acute events.   OBJECTIVE:  Vitals:   08/25/18 1309 08/25/18 1516  BP: (!) 118/59 (!) 143/55  Pulse:  79  Resp:  20  Temp:  98.5 F (36.9 C)  SpO2:      Intake/Output Summary (Last 24 hours) at 08/25/2018 1604 Last data filed at 08/25/2018 1529 Gross per 24 hour  Intake 830 ml  Output 950 ml  Net -120 ml      Patient not examined due to his current COVID-19 pandemic.  Extensive review of the chart, documented examinations, and discussion with the hospitalist was performed.  MEDICATIONS:  . aspirin EC  81 mg Oral Daily  . atorvastatin  80 mg Oral q1800  . Chlorhexidine Gluconate Cloth  6 each Topical Q0600  . dextrose      . ferrous sulfate  325 mg Oral Q breakfast  . [START ON 08/26/2018] gabapentin  600 mg Oral QHS  . glucagon (human recombinant)      . heparin  5,000 Units Subcutaneous Q8H  . insulin aspart  0-5 Units Subcutaneous QHS  . insulin aspart  0-9 Units Subcutaneous TID WC  . insulin glargine  40 Units Subcutaneous Daily  . [START ON 08/26/2018] levothyroxine  75 mcg Oral Q0600  . mouth rinse  15 mL Mouth Rinse BID  . methylPREDNISolone (SOLU-MEDROL) injection  50 mg Intravenous Q12H  . midodrine  10 mg Oral BID  . pantoprazole  40 mg Oral Daily  . traZODone  50 mg Oral QHS  . vitamin C  500 mg Oral Daily  . zinc sulfate  220 mg Oral Daily       LABS:   CBC Latest Ref Rng & Units 08/25/2018 08/23/2018 09/05/2017  WBC 4.0 - 10.5 K/uL 6.3 5.0 6.2  Hemoglobin 12.0 - 15.0 g/dL 9.2(L) 9.5(L) 12.1  Hematocrit 36.0 - 46.0 % 27.6(L) 29.3(L) 35.3  Platelets 150 - 400 K/uL 147(L) 121(L) 212    CMP Latest Ref Rng & Units 08/25/2018 08/23/2018 08/08/2018  Glucose 70 - 99 mg/dL 474(H) 153(H) -  BUN 8 - 23 mg/dL 117(H) 77(H) -  Creatinine 0.44  - 1.00 mg/dL 19.53(H) 15.71(H) 9.8(A)  Sodium 135 - 145 mmol/L 129(L) 132(L) -  Potassium 3.5 - 5.1 mmol/L 6.1(H) 5.1 -  Chloride 98 - 111 mmol/L 88(L) 88(L) -  CO2 22 - 32 mmol/L 17(L) 24 -  Calcium 8.9 - 10.3 mg/dL 7.0(L) 7.2(L) -  Total Protein 6.5 - 8.1 g/dL - 6.8 -  Total Bilirubin 0.3 - 1.2 mg/dL - 0.9 -  Alkaline Phos 38 - 126 U/L - 45 -  AST 15 - 41 U/L - 28 -  ALT 0 - 44 U/L - 16 -    Lab Results  Component Value Date   PTH 135 (H) 02/22/2017   CALCIUM 7.0 (L) 08/25/2018   CAION 1.20 07/03/2013   PHOS 10.0 (H) 08/25/2018       Component Value Date/Time   COLORURINE YELLOW (A) 08/23/2018 1209   APPEARANCEUR CLOUDY (A) 08/23/2018 1209   APPEARANCEUR Hazy 07/23/2013 0741   LABSPEC 1.016 08/23/2018 1209   LABSPEC 1.016 07/23/2013 0741   PHURINE 5.0 08/23/2018 1209   GLUCOSEU NEGATIVE 08/23/2018 1209   GLUCOSEU Negative 07/23/2013 0741   HGBUR SMALL (A) 08/23/2018 1209   BILIRUBINUR  NEGATIVE 08/23/2018 1209   BILIRUBINUR Negative 07/23/2013 0741   KETONESUR NEGATIVE 08/23/2018 1209   PROTEINUR 100 (A) 08/23/2018 1209   UROBILINOGEN 0.2 06/30/2013 2213   NITRITE NEGATIVE 08/23/2018 1209   LEUKOCYTESUR SMALL (A) 08/23/2018 1209   LEUKOCYTESUR Negative 07/23/2013 0741      Component Value Date/Time   PHART 7.371 07/02/2013 1808   PCO2ART 42.9 07/02/2013 1808   PO2ART 79.0 (L) 07/02/2013 1808   HCO3 25.4 (H) 07/02/2013 1903   TCO2 24 07/03/2013 1754   O2SAT 95.0 07/02/2013 1903       Component Value Date/Time   IRON 64 08/11/2015 0945   FERRITIN 3,164 (H) 08/25/2018 0841   IRONPCTSAT 23.8 08/11/2015 0945       ASSESSMENT/PLAN:    63 year old female patient with a past medical history significant for end-stage renal disease on hemodialysis Monday, Wednesday, and Friday presented for dialysis in transfer from [place].  She presented on 08/25/18, with her last dialysis being the prior Monday.  She had progressive weakness likely secondary to COVID-19, and was  unable to make it to dialysis.  1.  End-stage renal disease.  We will continue dialysis on Monday, Wednesday, and Friday.  We will add midodrine. Volume status and electrolytes are stable.  Syncope on dialysis noted today.  2.  COVID-19 positive.  Is being treated with oxygen, Solu-Medrol, and is being considered for remdesivir.  Will defer to primary team.  3.  Possible urinary tract infection.  Is being treated for urinary tract infection with Rocephin pending culture.  4.  Anemia of chronic disease.  Likely secondary to end-stage renal disease.  Hemoglobin is 9.5.  Likely low in the setting of current infection.  Will not likely respond well to ESA.  5.  Type 2 diabetes.  Continue Accu-Cheks and a sliding scale insulin.  6.  Hypertension.  Stable.  Continue current therapy.     [place], DO, [place]

## 2018-08-25 NOTE — Evaluation (Signed)
Occupational Therapy Evaluation Patient Details Name: Jamie Arias MRN: [id number] DOB: [birth year] Today's Date: 08/25/2018    History of Present Illness Jamie Arias is a 63 y.o. female with medical history significant of ESRD on HD MWF, anemia, chronic diastolic congestive heart failure, CAD status post PCI, type 2 diabetes, hypertension, hyperlipidemia, hypothyroidism, and conditions listed below presenting as a transfer from [facility].  COVID-19 positive.  Patient transferred to [facility] for management of COVID-19 and hemodialysis.    Clinical Impression   This 63 y/o female presents with the above. PTA pt reports spouse assists with ADL, reports use of RW for functional mobility. Pt presenting with increased lethargy and fatigue today, with generalized weakness. Pt requires cues to remain awake intermittently during this session. Pt noted to have elevated BP with attempts to check vitals, end of session RN rechecking vitals and BP WNL. Pt currently requires totalA for LB ADL, at least minA for UB ADL. Pt also noted to have tremors at rest in bil UEs, discoordinated movements with attempts at movement/reaching. Pt will benefit from continued acute OT services and recommend ST rehab services after discharge to maximize her safety and independence with ADL and mobility. Will follow.     Follow Up Recommendations  CIR;SNF;Supervision/Assistance - 24 hour(pending pt progress)    Equipment Recommendations  3 in 1 bedside commode;Other (comment)(to be further assessed)           Precautions / Restrictions Precautions Precautions: Fall Precaution Comments: Dyskinesia-like movement, large tremors Restrictions Weight Bearing Restrictions: No                        ADL either performed or assessed with clinical judgement   ADL Overall ADL's : Needs assistance/impaired Eating/Feeding: Set up;Supervision/ safety;Bed level   Grooming: Set up;Min guard;Bed level                                  General ADL Comments: limited session as pt initially with elevated BP and pt with increased lethargy                         Pertinent Vitals/Pain Pain Assessment: No/denies pain     Hand Dominance Left   Extremity/Trunk Assessment Upper Extremity Assessment Upper Extremity Assessment: Generalized weakness;RUE deficits/detail;LUE deficits/detail RUE Deficits / Details: notable tremors at rest, discoordinated movements noted with reaching, moving UEs RUE Coordination: decreased fine motor;decreased gross motor LUE Deficits / Details: notable tremors at rest, discoordinated movements noted with reaching, moving UEs LUE Coordination: decreased fine motor;decreased gross motor   Lower Extremity Assessment Lower Extremity Assessment: Defer to PT evaluation   Cervical / Trunk Assessment Cervical / Trunk Assessment: Other exceptions Cervical / Trunk Exceptions: per PT eval pt with truncal ataxia in standing   Communication     Cognition Arousal/Alertness: Lethargic Behavior During Therapy: WFL for tasks assessed/performed Overall Cognitive Status: Within Functional Limits for tasks assessed                                 General Comments: overall appears WFL, pt in and out of sleep this session and requires cues to remain awake; will continue to assess   General Comments  BP checked start of session and elevated, rechecked in L upper arm, L forearm and L calf and  each time BP in the 200s (forearm 243/204), deferred further activity and RN notified upon exiting room and RN rechecking BP - reports BP stable upon rechecking     Exercises     Shoulder Instructions      Home Living Family/patient expects to be discharged to:: Private residence Living Arrangements: Spouse/significant other Available Help at Discharge: Family;Available 24 hours/day;Other (Comment)(Spouse with recent back surgery) Type of Home: Mobile  home Home Access: Stairs to enter Entrance Stairs-Number of Steps: 8 Entrance Stairs-Rails: Right(tells me she holds on to her husband or son) Home Layout: One level     Bathroom Shower/Tub: Occupational psychologist: Standard     Home Equipment: Civil engineer, contracting          Prior Functioning/Environment Level of Independence: Needs assistance  Gait / Transfers Assistance Needed: reports she uses RW for mobility ADL's / Homemaking Assistance Needed: pt reports spouse assists with all ADL, he completes iADL at this time            OT Problem List: Decreased strength;Decreased range of motion;Decreased activity tolerance;Impaired balance (sitting and/or standing);Decreased safety awareness;Decreased knowledge of use of DME or AE;Cardiopulmonary status limiting activity;Decreased coordination;Impaired UE functional use      OT Treatment/Interventions: Self-care/ADL training;Therapeutic exercise;Neuromuscular education;DME and/or AE instruction;Therapeutic activities;Patient/family education;Balance training    OT Goals(Current goals can be found in the care plan section) Acute Rehab OT Goals Patient Stated Goal: Hopes to get better and eventually start peritoneal dialysis OT Goal Formulation: With patient Time For Goal Achievement: 09/08/18 Potential to Achieve Goals: Good  OT Frequency: Min 2X/week   Barriers to D/C:            Co-evaluation              AM-PAC OT "6 Clicks" Daily Activity     Outcome Measure Help from another person eating meals?: A Little Help from another person taking care of personal grooming?: A Little Help from another person toileting, which includes using toliet, bedpan, or urinal?: Total Help from another person bathing (including washing, rinsing, drying)?: A Lot Help from another person to put on and taking off regular upper body clothing?: A Lot Help from another person to put on and taking off regular lower body clothing?: Total 6  Click Score: 12   End of Session Equipment Utilized During Treatment: Oxygen Nurse Communication: Mobility status;Other (comment)(elevated BP)  Activity Tolerance: Patient limited by fatigue;Patient limited by lethargy Patient left: in bed;with call bell/phone within reach  OT Visit Diagnosis: Muscle weakness (generalized) (M62.81)                Time: 0045-9977 OT Time Calculation (min): 22 min Charges:  OT General Charges $OT Visit: 1 Visit OT Evaluation $OT Eval Moderate Complexity: [place], OT [employer] Pager [phone] Office [phone]   Raymondo Band 08/25/2018, 5:00 PM

## 2018-08-25 NOTE — Consult Note (Signed)
   [facility] Inpatient Consult   08/25/2018  [name] 09-15-1955 [id number]    Patient screened to check if potential [facility] services needed with this Medicare/[employer] member with a 29% high risk for unplanned readmission and hospitalization. She has distant HX with [facility] Care Management team. Our community based plan of care has focused on disease management and community resource support.   Patient's history and physical dated 08/23/18 shows as follows: [name] is a 63 y.o. female with medical history significant of ESRD on HD M-W-F, anemia, chronic diastolic congestive heart failure, CAD status post PCI, type 2 diabetes, hypertension, hyperlipidemia, hypothyroidism, and conditions listed below presenting as a transfer from [facility].  COVID-19 positive.  Patient transferred to [facility] for management of COVID-19 and hemodialysis.  Patient states her last dialysis was on Monday, June 15 and since then she has not been able to go because she is not feeling well. Reports having generalized weakness. Reports having intermittent tremors for the past few months. (acute hypoxic respiratory failure secondary to COVID-19 viral infection)  Review of medical record shows that patient is being referred to inpatient rehab [CIR] at [facility] by PT and SNF (skilled nursing facility) is also an option.  Of note, [facility] Care Management services does not replace or interfere with any services that are arranged by transition of care case management or social work.   Will follow disposition needs. If disposition changes and there are community follow up needs, please refer to [facility] care management.    For questions and referral, please contact:   Isadora Delorey A. Janaia Kozel, BSN, RN-BC [facility] Liaison Cell: [phone]

## 2018-08-25 NOTE — Progress Notes (Signed)
Inpatient Diabetes Program Recommendations  AACE/ADA: New Consensus Statement on Inpatient Glycemic Control (2015)  Target Ranges:  Prepandial:   less than 140 mg/dL      Peak postprandial:   less than 180 mg/dL (1-2 hours)      Critically ill patients:  140 - 180 mg/dL   Lab Results  Component Value Date   GLUCAP 460 (H) 08/25/2018   HGBA1C 7.1 08/16/2017    Review of Glycemic Control Results for Jamie Arias, Jamie Arias (MRN [id number]) as of 08/25/2018 10:31  Ref. Range 08/24/2018 08:13 08/24/2018 12:22 08/24/2018 16:28 08/24/2018 21:09 08/25/2018 07:44  Glucose-Capillary Latest Ref Range: 70 - 99 mg/dL 148 (H) 241 (H) 274 (H) 305 (H) 460 (H)   Diabetes history: DM 2 Outpatient Diabetes medications: Lantus 40 units daily Current orders for Inpatient glycemic control:  Lantus 40 units daily Novolog sensitive tid with meals and HS Solumedrol 50 mg IV q 12 hours Inpatient Diabetes Program Recommendations:    Note that patient did not receive Lantus on 6/21- Home dose of Lantus restarted today.  Will follow.   Thanks,  Adah Perl, RN, BC-ADM Inpatient Diabetes Coordinator Pager [phone] (8a-5p)

## 2018-08-25 NOTE — Progress Notes (Signed)
At 1150 Pt. Became unresponsive eyes rolled back while on HD pt. Given NS bolus 217ml unable to obtain bp at this time code blue activated at arrival time pt. Became responsive with sbp 154. Pt. Stable code blue canceled. Pt. Rinsed back with 24 minutes treatment time remaining. Dr. Phineas Douglas telephoned no answer will attempt to notify again. Pt. Stable primary nurse made aware of events

## 2018-08-25 NOTE — Progress Notes (Signed)
PROGRESS NOTE    ELMER BOUTELLE  WCB:[id number] DOB: Dec 15, 1955 DOA: 08/23/2018 PCP: [name], MD    Brief Narrative:  63 y.o. female with medical history significant of ESRD on HD MWF, anemia, chronic diastolic congestive heart failure, CAD status post PCI, type 2 diabetes, hypertension, hyperlipidemia, hypothyroidism, and conditions listed below presenting as a transfer from [facility].  COVID-19 positive.  Patient transferred to [facility] for management of COVID-19 and hemodialysis.  Patient states her last dialysis was on Monday, June 15 and since then she has not been able to go because she is not feeling well.  Reports having generalized weakness.  Reports having intermittent tremors for the past few months.  Denies family history of tremors.  Denies any fevers, chills, chest pain, shortness of breath, cough, nausea, vomiting, abdominal pain, diarrhea, dysuria, or urinary frequency/urgency.  No additional history could be obtained from her.  Assessment & Plan:   Principal Problem:   COVID-19 virus infection Active Problems:   HTN (hypertension)   ESRD (end stage renal disease) (HCC)   Acute on chronic respiratory failure with hypoxia (HCC)   UTI (urinary tract infection)  Acute hypoxic respiratory failure secondary to COVID-19 viral infection Temperature 100.7 F at outside hospital.  COVID-19 positive.  Oxygen saturation in the mid 80s and placed on 2 L supplemental oxygen. Chest x-ray showing mild stable cardiomegaly and no active cardiopulmonary disease.  No leukocytosis.  Absolute lymphocyte count low.  Inflammatory markers elevated. -Airborne and contact precautions -Continued on Solu-Medrol 50 mg every 12 hours -Cont to wean O2 as tolerated -Chest CT reviewed. Patchy opacities noted worrisome for COVID pna. On 2LNC. Cont to wean O2 as tolerated -Blood culture x2 pending   End-stage renal disease, missed dialysis -Chest x-ray without evidence of pulmonary  edema. -Nephrology following. Attempted HD today with syncopal episode noted. Concerns of profound hypotension on HD. Pt on midodrine prior to admit. Have resumed. -Patient was given a 250cc bolus x 1, discussed with Nephrology  Mildly elevated troponin in the setting of end-stage renal disease -Troponin 0.03 >0.03 >0.03.   -Stable without chest pain  UTI -UA with negative nitrite, small amount of leukocytes, 11-20 WBCs, and no bacteria on microscopic examination.  Patient denies any UTI symptoms.  Received a dose of ceftriaxone. -Continued on ceftriaxone at this time.  Follow urine culture, pending at this time  Chronic anemia in the setting of end-stage renal disease -Hemoglobin 9.5.  No signs of active bleeding.   -Recheck CBC in AM  Thrombocytopenia -Platelet count 121.  No signs of active bleeding.   -Recheck CBC in AM  Tremors Patient reports having intermittent tremors for the past few months.  No history of ethanol use to suggest alcohol withdrawal. -Outpatient work up recommended  Type 2 diabetes -Sliding scale insulin and CBG checks. -Glucose up to 460, resumed home insulin with improvement in glucose  Hypertension -Normotensive.  Continue to monitor. -Stable currently  Physical deconditioning -PT/OT consulted with recommendation for CIR, will place order  DVT prophylaxis: Heparin subq Code Status: Full Family Communication: Pt in room, family not present Disposition Plan: Uncertain at this time  Consultants:   Nephrology  Procedures:     Antimicrobials: Anti-infectives (From admission, onward)   Start     Dose/Rate Route Frequency Ordered Stop   08/25/18 1000  cefTRIAXone (ROCEPHIN) 1 g in sodium chloride 0.9 % 100 mL IVPB     1 g 200 mL/hr over 30 Minutes Intravenous Every 24 hours 08/24/18  0022     08/24/18 0500  azithromycin (ZITHROMAX) 500 mg in sodium chloride 0.9 % 250 mL IVPB  Status:  Discontinued     500 mg 250 mL/hr over 60 Minutes  Intravenous Every 24 hours 08/24/18 0453 08/24/18 0454      Subjective: Initially without compliants when seen on HD. Later patient unresponsive and Code Blue called, regained consciousness  Objective: Vitals:   08/25/18 1302 08/25/18 1309 08/25/18 1516 08/25/18 1616  BP: (!) 143/104 (!) 118/59 (!) 143/55 108/89  Pulse:   79 74  Resp:   20   Temp:   98.5 F (36.9 C)   TempSrc:   Oral   SpO2:    97%  Weight:      Height:        Intake/Output Summary (Last 24 hours) at 08/25/2018 1652 Last data filed at 08/25/2018 1529 Gross per 24 hour  Intake 830 ml  Output 950 ml  Net -120 ml   Filed Weights   08/23/18 2244 08/25/18 0750 08/25/18 1200  Weight: 96.6 kg 99.2 kg 98 kg    Examination: General exam: Conversant, in no acute distress Respiratory system: normal chest rise, clear, no audible wheezing Cardiovascular system: regular rhythm, s1-s2 Gastrointestinal system: Nondistended, nontender, pos BS Central nervous system: No seizures, no tremors Extremities: No cyanosis, no joint deformities Skin: No rashes, no pallor Psychiatry: Affect normal // no auditory hallucinations   Data Reviewed: I have personally reviewed following labs and imaging studies  CBC: Recent Labs  Lab 08/23/18 1325 08/25/18 0841  WBC 5.0 6.3  NEUTROABS 4.1  --   HGB 9.5* 9.2*  HCT 29.3* 27.6*  MCV 99.0 95.8  PLT 121* 081*   Basic Metabolic Panel: Recent Labs  Lab 08/23/18 0912 08/25/18 0841  NA 132* 129*  K 5.1 6.1*  CL 88* 88*  CO2 24 17*  GLUCOSE 153* 474*  BUN 77* 117*  CREATININE 15.71* 19.53*  CALCIUM 7.2* 7.0*  PHOS  --  10.0*   GFR: Estimated Creatinine Clearance: 3.3 mL/min (A) (by C-G formula based on SCr of 19.53 mg/dL (H)). Liver Function Tests: Recent Labs  Lab 08/23/18 0912 08/25/18 0841  AST 28  --   ALT 16  --   ALKPHOS 45  --   BILITOT 0.9  --   PROT 6.8  --   ALBUMIN 3.3* 2.8*   No results for input(s): LIPASE, AMYLASE in the last 168 hours. Recent  Labs  Lab 08/23/18 1103  AMMONIA 20   Coagulation Profile: No results for input(s): INR, PROTIME in the last 168 hours. Cardiac Enzymes: Recent Labs  Lab 08/23/18 0912 08/23/18 1325 08/24/18 0134  TROPONINI 0.03* 0.03* 0.03*   BNP (last 3 results) No results for input(s): PROBNP in the last 8760 hours. HbA1C: No results for input(s): HGBA1C in the last 72 hours. CBG: Recent Labs  Lab 08/24/18 2109 08/25/18 0744 08/25/18 1106 08/25/18 1205 08/25/18 1509  GLUCAP 305* 460* 165* 153* 170*   Lipid Profile: No results for input(s): CHOL, HDL, LDLCALC, TRIG, CHOLHDL, LDLDIRECT in the last 72 hours. Thyroid Function Tests: No results for input(s): TSH, T4TOTAL, FREET4, T3FREE, THYROIDAB in the last 72 hours. Anemia Panel: Recent Labs    08/24/18 0134 08/25/18 0841  FERRITIN 2,852* 3,164*   Sepsis Labs: Recent Labs  Lab 08/23/18 1053 08/24/18 0134  PROCALCITON  --  0.36  LATICACIDVEN 1.3  --     Recent Results (from the past 240 hour(s))  SARS Coronavirus 2 (CEPHEID -  Performed in [facility] hospital lab), Hosp Order     Status: Abnormal   Collection Time: 08/23/18  9:12 AM   Specimen: Nasopharyngeal Swab  Result Value Ref Range Status   SARS Coronavirus 2 POSITIVE (A) NEGATIVE Final    Comment: RESULT CALLED TO, READ BACK BY AND VERIFIED WITH: KIM GAULT AT 1117 08/23/2018.PMF (NOTE) If result is NEGATIVE SARS-CoV-2 target nucleic acids are NOT DETECTED. The SARS-CoV-2 RNA is generally detectable in upper and lower  respiratory specimens during the acute phase of infection. The lowest  concentration of SARS-CoV-2 viral copies this assay can detect is 250  copies / mL. A negative result does not preclude SARS-CoV-2 infection  and should not be used as the sole basis for treatment or other  patient management decisions.  A negative result may occur with  improper specimen collection / handling, submission of specimen other  than nasopharyngeal swab, presence of  viral mutation(s) within the  areas targeted by this assay, and inadequate number of viral copies  (<250 copies / mL). A negative result must be combined with clinical  observations, patient history, and epidemiological information. If result is POSITIVE SARS-CoV-2 target nucleic acids are DETECTED. The S ARS-CoV-2 RNA is generally detectable in upper and lower  respiratory specimens during the acute phase of infection.  Positive  results are indicative of active infection with SARS-CoV-2.  Clinical  correlation with patient history and other diagnostic information is  necessary to determine patient infection status.  Positive results do  not rule out bacterial infection or co-infection with other viruses. If result is PRESUMPTIVE POSTIVE SARS-CoV-2 nucleic acids MAY BE PRESENT.   A presumptive positive result was obtained on the submitted specimen  and confirmed on repeat testing.  While 2019 novel coronavirus  (SARS-CoV-2) nucleic acids may be present in the submitted sample  additional confirmatory testing may be necessary for epidemiological  and / or clinical management purposes  to differentiate between  SARS-CoV-2 and other Sarbecovirus currently known to infect humans.  If clinically indicated additional testing with an alternate test  methodology [id number]) is adv ised. The SARS-CoV-2 RNA is generally  detectable in upper and lower respiratory specimens during the acute  phase of infection. The expected result is Negative. Fact Sheet for Patients:  StrictlyIdeas.no Fact Sheet for Healthcare Providers: BankingDealers.co.za This test is not yet approved or cleared by the [place] FDA and has been authorized for detection and/or diagnosis of SARS-CoV-2 by FDA under an Emergency Use Authorization (EUA).  This EUA will remain in effect (meaning this test can be used) for the duration of the COVID-19 declaration under Section  564(b)(1) of the Act, 21 U.S.C. section 360bbb-3(b)(1), unless the authorization is terminated or revoked sooner. Performed at [facility], [address] [place], [place] [address]   Culture, blood (routine x 2)     Status: None (Preliminary result)   Collection Time: 08/23/18  9:13 AM   Specimen: BLOOD  Result Value Ref Range Status   Specimen Description BLOOD L WRIST  Final   Special Requests   Final    BOTTLES DRAWN AEROBIC AND ANAEROBIC Blood Culture results may not be optimal due to an inadequate volume of blood received in culture bottles   Culture   Final    NO GROWTH 2 DAYS Performed at [facility], [address], [place], [place] [address]    Report Status PENDING  Incomplete  Culture, blood (routine x 2)     Status: None (  Preliminary result)   Collection Time: 08/23/18  9:13 AM   Specimen: BLOOD  Result Value Ref Range Status   Specimen Description BLOOD L HAND  Final   Special Requests   Final    BOTTLES DRAWN AEROBIC AND ANAEROBIC Blood Culture results may not be optimal due to an inadequate volume of blood received in culture bottles   Culture   Final    NO GROWTH 2 DAYS Performed at [facility], [address], [place], [place] [address]    Report Status PENDING  Incomplete  Urine culture     Status: None   Collection Time: 08/23/18 12:09 PM   Specimen: Urine, Random  Result Value Ref Range Status   Specimen Description   Final    URINE, RANDOM Performed at [facility], [address] [place], [place] [address]    Special Requests   Final    NONE Performed at [facility], [address], [place], [place] [address]    Culture   Final    NO GROWTH Performed at [facility], [place] [name], [place], [place] [address]    Report Status 08/25/2018 FINAL  Final  MRSA PCR Screening     Status: None   Collection Time: 08/23/18 11:45 PM   Specimen: Nasopharyngeal  Result Value Ref Range Status    MRSA by PCR NEGATIVE NEGATIVE Final    Comment:        The GeneXpert MRSA Assay (FDA approved for NASAL specimens only), is one component of a comprehensive MRSA colonization surveillance program. It is not intended to diagnose MRSA infection nor to guide or monitor treatment for MRSA infections. Performed at [facility], [place] [address] [place], [place] [address]      Radiology Studies: Ct Chest Wo Contrast  Result Date: 08/24/2018 CLINICAL DATA:  COVID-19 positive, dyspnea EXAM: CT CHEST WITHOUT CONTRAST TECHNIQUE: Multidetector CT imaging of the chest was performed following the standard protocol without IV contrast. COMPARISON:  Chest radiograph from one day prior. 10/23/2015 chest CT. FINDINGS: Cardiovascular: Top-normal heart size. No significant pericardial effusion/thickening. Three-vessel coronary atherosclerosis status post CABG. Atherosclerotic nonaneurysmal thoracic aorta. Dilated main pulmonary artery (3.7 cm diameter). Partially visualized right axillary and proximal right upper extremity venous stents. Mediastinum/Nodes: No discrete thyroid nodules. Unremarkable esophagus. No pathologically enlarged axillary, mediastinal or hilar lymph nodes, noting limited sensitivity for the detection of hilar adenopathy on this noncontrast study. Lungs/Pleura: No pneumothorax. No pleural effusion. Chronic rounded atelectasis in dependent basilar left lower lobe adjacent to mild left pleural thickening. Patchy ground-glass opacities throughout both lungs involving all lung lobes, most prominent in the left upper lobe. No central airway stenoses. No discrete lung masses or significant pulmonary nodules. Upper abdomen: Cholecystectomy. Musculoskeletal: No aggressive appearing focal osseous lesions. Intact sternotomy wires. Moderate thoracic spondylosis. IMPRESSION: 1. Patchy ground-glass opacities throughout both lungs involving all lung lobes, most prominent in the left upper lobe, compatible  with known COVID-19 pneumonia. There are a spectrum of findings in the lungs which can be seen with acute atypical infection (as well as other non-infectious etiologies). In particular, viral pneumonia (including COVID-19) should be considered in the appropriate clinical setting. 2. Chronic rounded atelectasis in the dependent left lung base with chronic smooth left pleural thickening. 3. Dilated main pulmonary artery, suggesting pulmonary arterial hypertension. Aortic Atherosclerosis (ICD10-I70.0). Electronically Signed   By: Ilona Sorrel M.D.   On: 08/24/2018 08:33    Scheduled Meds:  aspirin EC  81  mg Oral Daily   atorvastatin  80 mg Oral q1800   Chlorhexidine Gluconate Cloth  6 each Topical Q0600   dextrose       ferrous sulfate  325 mg Oral Q breakfast   [START ON 08/26/2018] gabapentin  600 mg Oral QHS   glucagon (human recombinant)       heparin  5,000 Units Subcutaneous Q8H   insulin aspart  0-5 Units Subcutaneous QHS   insulin aspart  0-9 Units Subcutaneous TID WC   insulin glargine  40 Units Subcutaneous Daily   [START ON 08/26/2018] levothyroxine  75 mcg Oral Q0600   mouth rinse  15 mL Mouth Rinse BID   methylPREDNISolone (SOLU-MEDROL) injection  50 mg Intravenous Q12H   midodrine  10 mg Oral BID   pantoprazole  40 mg Oral Daily   traZODone  50 mg Oral QHS   vitamin C  500 mg Oral Daily   zinc sulfate  220 mg Oral Daily   Continuous Infusions:  sodium chloride     sodium chloride     cefTRIAXone (ROCEPHIN)  IV Stopped (08/25/18 1236)     LOS: 2 days   Marylu Lund, MD [facility] Pager On Amion  If 7PM-7AM, please contact night-coverage 08/25/2018, 4:52 PM

## 2018-08-25 NOTE — Progress Notes (Signed)
Dr. Phineas Douglas made aware of pt. Potassium 6.1. no new orders continue with current dialysis orders.

## 2018-08-26 LAB — FERRITIN: Ferritin: 3647 ng/mL — ABNORMAL HIGH (ref 11–307)

## 2018-08-26 LAB — BASIC METABOLIC PANEL
Anion gap: 21 — ABNORMAL HIGH (ref 5–15)
BUN: 57 mg/dL — ABNORMAL HIGH (ref 8–23)
CO2: 20 mmol/L — ABNORMAL LOW (ref 22–32)
Calcium: 7.6 mg/dL — ABNORMAL LOW (ref 8.9–10.3)
Chloride: 94 mmol/L — ABNORMAL LOW (ref 98–111)
Creatinine, Ser: 10.59 mg/dL — ABNORMAL HIGH (ref 0.44–1.00)
GFR calc Af Amer: 4 mL/min — ABNORMAL LOW (ref >60)
GFR calc non Af Amer: 3 mL/min — ABNORMAL LOW (ref >60)
Glucose, Bld: 327 mg/dL — ABNORMAL HIGH (ref 70–99)
Potassium: 5.2 mmol/L — ABNORMAL HIGH (ref 3.5–5.1)
Sodium: 135 mmol/L (ref 135–145)

## 2018-08-26 LAB — GLUCOSE, CAPILLARY
Glucose-Capillary: 284 mg/dL — ABNORMAL HIGH (ref 70–99)
Glucose-Capillary: 297 mg/dL — ABNORMAL HIGH (ref 70–99)
Glucose-Capillary: 369 mg/dL — ABNORMAL HIGH (ref 70–99)
Glucose-Capillary: 341 mg/dL — ABNORMAL HIGH (ref 70–99)

## 2018-08-26 LAB — C-REACTIVE PROTEIN: CRP: 9.8 mg/dL — ABNORMAL HIGH (ref <1.0)

## 2018-08-26 MED ORDER — MIDODRINE HCL 5 MG PO TABS
10.0000 mg | ORAL_TABLET | Freq: Two times a day (BID) | ORAL | Status: DC
  Administered 2018-08-26 – 2018-09-02 (×15): 10 mg via ORAL
  Filled 2018-08-26 (×16): qty 2

## 2018-08-26 MED ORDER — DARBEPOETIN ALFA 60 MCG/0.3ML IJ SOSY
60.0000 ug | PREFILLED_SYRINGE | INTRAMUSCULAR | Status: DC
  Administered 2018-08-27: 60 ug via INTRAVENOUS
  Filled 2018-08-26 (×2): qty 0.3

## 2018-08-26 MED ORDER — INSULIN GLARGINE 100 UNIT/ML ~~LOC~~ SOLN
45.0000 [IU] | Freq: Every day | SUBCUTANEOUS | Status: DC
  Administered 2018-08-27 – 2018-09-08 (×12): 45 [IU] via SUBCUTANEOUS
  Filled 2018-08-26 (×13): qty 0.45

## 2018-08-26 MED ORDER — INSULIN ASPART 100 UNIT/ML ~~LOC~~ SOLN
3.0000 [IU] | Freq: Three times a day (TID) | SUBCUTANEOUS | Status: DC
  Administered 2018-08-26 – 2018-09-01 (×16): 3 [IU] via SUBCUTANEOUS

## 2018-08-26 MED ORDER — MIDODRINE HCL 5 MG PO TABS: 10.0000 mg | ORAL_TABLET | Freq: Two times a day (BID) | ORAL | Status: DC

## 2018-08-26 MED ORDER — CHLORHEXIDINE GLUCONATE CLOTH 2 % EX PADS
6.0000 | MEDICATED_PAD | Freq: Every day | CUTANEOUS | Status: DC
  Administered 2018-08-28: 6 via TOPICAL

## 2018-08-26 MED ORDER — ACETAMINOPHEN 325 MG PO TABS
650.0000 mg | ORAL_TABLET | Freq: Four times a day (QID) | ORAL | Status: DC | PRN
  Administered 2018-08-26 – 2018-09-06 (×3): 650 mg via ORAL
  Filled 2018-08-26 (×3): qty 2

## 2018-08-26 MED ORDER — CALCIUM ACETATE (PHOS BINDER) 667 MG PO CAPS
2001.0000 mg | ORAL_CAPSULE | Freq: Three times a day (TID) | ORAL | Status: DC
  Administered 2018-08-26 – 2018-09-13 (×43): 2001 mg via ORAL
  Filled 2018-08-26 (×48): qty 3

## 2018-08-26 NOTE — Progress Notes (Signed)
[facility]    NEPHROLOGY PROGRESS NOTE  SUBJECTIVE:  She underwent HD on 6/22 and had a syncopal episode after UF.  Patient did not answer room phone.  She reports that she takes 5 mg BID on non-HD days and 10 mg BID on dialysis days.   Due to the nature of this patient's suspected COVID-19 with isolation and in keeping with efforts to prevent the spread of infection and to conserve personal protective equipment, a physical exam was not personally performed.  Patient's symptoms and exam were discussed in detail with the RN.  A chart review of other providers notes and the patient's lab work as well as review of other pertinent studies was performed.  Exam details from prior documentation were reviewed specifically and confirmed with the bedside nurse.  Location of service: [facility]   Review of systems.  Per nursing patient denies nausea or vomiting Denies shortness of breath per nursing Has had headache and a tremor per nursing   OBJECTIVE:  Vitals:   08/26/18 0000 08/26/18 0900  BP: (!) 153/49 (!) 142/110  Pulse:  81  Resp:  18  Temp:  98.4 F (36.9 C)  SpO2:  95%    Intake/Output Summary (Last 24 hours) at 08/26/2018 1454 Last data filed at 08/26/2018 1000 Gross per 24 hour  Intake 425 ml  Output -  Net 425 ml     MEDICATIONS:  . aspirin EC  81 mg Oral Daily  . atorvastatin  80 mg Oral q1800  . calcium acetate  2,001 mg Oral TID WC  . Chlorhexidine Gluconate Cloth  6 each Topical Q0600  . ferrous sulfate  325 mg Oral Q breakfast  . gabapentin  600 mg Oral QHS  . heparin  5,000 Units Subcutaneous Q8H  . insulin aspart  0-5 Units Subcutaneous QHS  . insulin aspart  0-9 Units Subcutaneous TID WC  . insulin aspart  3 Units Subcutaneous TID WC  . [START ON 08/27/2018] insulin glargine  45 Units Subcutaneous Daily  . levothyroxine  75 mcg Oral Q0600  . mouth rinse  15 mL Mouth Rinse BID  . methylPREDNISolone (SOLU-MEDROL) injection  50 mg  Intravenous Q12H  . midodrine  10 mg Oral BID PC  . pantoprazole  40 mg Oral Daily  . traZODone  50 mg Oral QHS  . vitamin C  500 mg Oral Daily  . zinc sulfate  220 mg Oral Daily    Exam:   Exam is per nursing.  Appreciate RNs assistance  Adult female no acute distress Alert and oriented  Clear but reduced breath sounds RRR  abd nondistended No lower extremity edema     LABS:   CBC Latest Ref Rng & Units 08/25/2018 08/23/2018 09/05/2017  WBC 4.0 - 10.5 K/uL 6.3 5.0 6.2  Hemoglobin 12.0 - 15.0 g/dL 9.2(L) 9.5(L) 12.1  Hematocrit 36.0 - 46.0 % 27.6(L) 29.3(L) 35.3  Platelets 150 - 400 K/uL 147(L) 121(L) 212    CMP Latest Ref Rng & Units 08/26/2018 08/25/2018 08/23/2018  Glucose 70 - 99 mg/dL 327(H) 474(H) 153(H)  BUN 8 - 23 mg/dL 57(H) 117(H) 77(H)  Creatinine 0.44 - 1.00 mg/dL 10.59(H) 19.53(H) 15.71(H)  Sodium 135 - 145 mmol/L 135 129(L) 132(L)  Potassium 3.5 - 5.1 mmol/L 5.2(H) 6.1(H) 5.1  Chloride 98 - 111 mmol/L 94(L) 88(L) 88(L)  CO2 22 - 32 mmol/L 20(L) 17(L) 24  Calcium 8.9 - 10.3 mg/dL 7.6(L) 7.0(L) 7.2(L)  Total Protein 6.5 - 8.1 g/dL - -  6.8  Total Bilirubin 0.3 - 1.2 mg/dL - - 0.9  Alkaline Phos 38 - 126 U/L - - 45  AST 15 - 41 U/L - - 28  ALT 0 - 44 U/L - - 16    Lab Results  Component Value Date   PTH 135 (H) 02/22/2017   CALCIUM 7.6 (L) 08/26/2018   CAION 1.20 07/03/2013   PHOS 10.0 (H) 08/25/2018       Component Value Date/Time   COLORURINE YELLOW (A) 08/23/2018 1209   APPEARANCEUR CLOUDY (A) 08/23/2018 1209   APPEARANCEUR Hazy 07/23/2013 0741   LABSPEC 1.016 08/23/2018 1209   LABSPEC 1.016 07/23/2013 0741   PHURINE 5.0 08/23/2018 1209   GLUCOSEU NEGATIVE 08/23/2018 1209   GLUCOSEU Negative 07/23/2013 0741   HGBUR SMALL (A) 08/23/2018 1209   BILIRUBINUR NEGATIVE 08/23/2018 1209   BILIRUBINUR Negative 07/23/2013 0741   KETONESUR NEGATIVE 08/23/2018 1209   PROTEINUR 100 (A) 08/23/2018 1209   UROBILINOGEN 0.2 06/30/2013 2213   NITRITE NEGATIVE  08/23/2018 1209   LEUKOCYTESUR SMALL (A) 08/23/2018 1209   LEUKOCYTESUR Negative 07/23/2013 0741      Component Value Date/Time   PHART 7.371 07/02/2013 1808   PCO2ART 42.9 07/02/2013 1808   PO2ART 79.0 (L) 07/02/2013 1808   HCO3 25.4 (H) 07/02/2013 1903   TCO2 24 07/03/2013 1754   O2SAT 95.0 07/02/2013 1903       Component Value Date/Time   IRON 64 08/11/2015 0945   FERRITIN 3,647 (H) 08/26/2018 0944   IRONPCTSAT 23.8 08/11/2015 0945       ASSESSMENT/PLAN:    63 year old female patient with a past medical history significant for end-stage renal disease on hemodialysis Monday, Wednesday, and Friday presented for dialysis in transfer from [place].  She presented on 08/25/18, with her last dialysis being the prior Monday.  She had progressive weakness likely secondary to COVID-19, and was unable to make it to dialysis.  1.  End-stage renal disease.  We will continue dialysis on Monday, Wednesday, and Friday.  Note addition of midodrine.  Syncope on dialysis 6/22 may have been secondary to UF goal (optimizing volume with covid).  Will reduce UF goal.  Hyperkalemia improved.  2K bath  2.  COVID-19 positive. Per primary team.  Oxygen requirement improving per nursing  3.  Possible urinary tract infection.  Is being treated for urinary tract infection per primary team  4.  Anemia of chronic disease.  Likely secondary to end-stage renal disease as well as acute illnesss.  No acute indication for PRBC's.   5.  Type 2 diabetes.  per primary team   6.  Hypertension.  Home regimen is actually midodrine 5 mg BID on non-HD days and 10 mg BID on dialysis days.  Follow on the 10 mg BID for now.

## 2018-08-26 NOTE — Progress Notes (Signed)
PROGRESS NOTE    Jamie Arias  CXK:[id number] DOB: [birth year] DOA: 08/23/2018 PCP: [name], MD    Brief Narrative:  63 y.o. female with medical history significant of ESRD on HD MWF, anemia, chronic diastolic congestive heart failure, CAD status post PCI, type 2 diabetes, hypertension, hyperlipidemia, hypothyroidism, and conditions listed below presenting as a transfer from [facility].  COVID-19 positive.  Patient transferred to [facility] for management of COVID-19 and hemodialysis.  Patient states her last dialysis was on Monday, June 15 and since then she has not been able to go because she is not feeling well.  Reports having generalized weakness.  Reports having intermittent tremors for the past few months.  Denies family history of tremors.  Denies any fevers, chills, chest pain, shortness of breath, cough, nausea, vomiting, abdominal pain, diarrhea, dysuria, or urinary frequency/urgency.  No additional history could be obtained from her.  Assessment & Plan:   Principal Problem:   COVID-19 virus infection Active Problems:   HTN (hypertension)   ESRD (end stage renal disease) (HCC)   Acute on chronic respiratory failure with hypoxia (HCC)   UTI (urinary tract infection)  Acute hypoxic respiratory failure secondary to COVID-19 viral infection Temperature 100.7 F at outside hospital.  COVID-19 positive.  Oxygen saturation in the mid 80s and placed on 2 L supplemental oxygen. Chest x-ray showing mild stable cardiomegaly and no active cardiopulmonary disease.  No leukocytosis.  Absolute lymphocyte count low.  Inflammatory markers elevated. -Airborne and contact precautions -Continued on Solu-Medrol 50 mg every 12 hours -Cont to wean O2 as tolerated -Chest CT reviewed. Patchy opacities noted worrisome for COVID pna. Weaned to [facility]. Cont to wean O2 as tolerated -CRP down to 9.8 from 11.3 -Blood culture x2 neg x 2 days   End-stage renal disease, missed dialysis -Chest  x-ray without evidence of pulmonary edema. -Nephrology following. Attempted HD today with syncopal episode noted. Concerns of profound hypotension on HD. Pt on midodrine prior to admit. Have resumed. -BP now stable  Mildly elevated troponin in the setting of end-stage renal disease -Troponin 0.03 >0.03 >0.03.   -Currently stable  UTI -UA with negative nitrite, small amount of leukocytes, 11-20 WBCs, and no bacteria on microscopic examination.  Patient denies any UTI symptoms.  Received a dose of ceftriaxone. -Continued on ceftriaxone at this time.  Urine cx without growth. Will complete total 3 day course of empiric abx  Chronic anemia in the setting of end-stage renal disease -Hemoglobin 9.5.  No signs of active bleeding.   -Hgb had remained stable  Thrombocytopenia -Platelet count 121.  No signs of active bleeding.   -Plts increased to 147 on last checked  Tremors Patient reports having intermittent tremors for the past few months.  No history of ethanol use to suggest alcohol withdrawal. -Recommend outpatient work up  Type 2 diabetes -Sliding scale insulin and CBG checks. -Resumed home insulin with improvement in glucose.  -Glucose currently in the upper 200's while on steroids. Increase lantus to 45 units and added meal coverage -Cont to titrate insulin as needed  Hypertension -Normotensive.  Continue to monitor. -Presently stable  Physical deconditioning -PT/OT consulted with recommendation for CIR, have placed order  DVT prophylaxis: Heparin subq Code Status: Full Family Communication: Pt in room, family not present Disposition Plan: Uncertain at this time  Consultants:   Nephrology  Procedures:     Antimicrobials: Anti-infectives (From admission, onward)   Start     Dose/Rate Route Frequency Ordered Stop   08/25/18  1000  cefTRIAXone (ROCEPHIN) 1 g in sodium chloride 0.9 % 100 mL IVPB     1 g 200 mL/hr over 30 Minutes Intravenous Every 24 hours  08/24/18 0022     08/24/18 0500  azithromycin (ZITHROMAX) 500 mg in sodium chloride 0.9 % 250 mL IVPB  Status:  Discontinued     500 mg 250 mL/hr over 60 Minutes Intravenous Every 24 hours 08/24/18 0453 08/24/18 0454      Subjective: No complaints this AM. Reports feeling better  Objective: Vitals:   08/25/18 1616 08/25/18 2100 08/26/18 0000 08/26/18 0900  BP: 108/89 (!) 124/102 (!) 153/49 (!) 142/110  Pulse: 74 66  81  Resp:  20  18  Temp:  98.9 F (37.2 C)  98.4 F (36.9 C)  TempSrc:  Oral  Oral  SpO2: 97% 97%  95%  Weight:      Height:        Intake/Output Summary (Last 24 hours) at 08/26/2018 1531 Last data filed at 08/26/2018 1000 Gross per 24 hour  Intake 75 ml  Output --  Net 75 ml   Filed Weights   08/23/18 2244 08/25/18 0750 08/25/18 1200  Weight: 96.6 kg 99.2 kg 98 kg    Examination: General exam: Awake, laying in bed, in nad Respiratory system: Normal respiratory effort, no wheezing Cardiovascular system: regular rate, s1, s2 Gastrointestinal system: Soft, nondistended, positive BS Central nervous system: CN2-12 grossly intact, strength intact Extremities: Perfused, no clubbing Skin: Normal skin turgor, no notable skin lesions seen Psychiatry: Mood normal // no visual hallucinations   Data Reviewed: I have personally reviewed following labs and imaging studies  CBC: Recent Labs  Lab 08/23/18 1325 08/25/18 0841  WBC 5.0 6.3  NEUTROABS 4.1  --   HGB 9.5* 9.2*  HCT 29.3* 27.6*  MCV 99.0 95.8  PLT 121* 761*   Basic Metabolic Panel: Recent Labs  Lab 08/23/18 0912 08/25/18 0841 08/26/18 0944  NA 132* 129* 135  K 5.1 6.1* 5.2*  CL 88* 88* 94*  CO2 24 17* 20*  GLUCOSE 153* 474* 327*  BUN 77* 117* 57*  CREATININE 15.71* 19.53* 10.59*  CALCIUM 7.2* 7.0* 7.6*  PHOS  --  10.0*  --    GFR: Estimated Creatinine Clearance: 6.1 mL/min (A) (by C-G formula based on SCr of 10.59 mg/dL (H)). Liver Function Tests: Recent Labs  Lab 08/23/18 0912  08/25/18 0841  AST 28  --   ALT 16  --   ALKPHOS 45  --   BILITOT 0.9  --   PROT 6.8  --   ALBUMIN 3.3* 2.8*   No results for input(s): LIPASE, AMYLASE in the last 168 hours. Recent Labs  Lab 08/23/18 1103  AMMONIA 20   Coagulation Profile: No results for input(s): INR, PROTIME in the last 168 hours. Cardiac Enzymes: Recent Labs  Lab 08/23/18 0912 08/23/18 1325 08/24/18 0134  TROPONINI 0.03* 0.03* 0.03*   BNP (last 3 results) No results for input(s): PROBNP in the last 8760 hours. HbA1C: No results for input(s): HGBA1C in the last 72 hours. CBG: Recent Labs  Lab 08/25/18 1205 08/25/18 1509 08/25/18 2035 08/26/18 0903 08/26/18 1213  GLUCAP 153* 170* 186* 284* 297*   Lipid Profile: No results for input(s): CHOL, HDL, LDLCALC, TRIG, CHOLHDL, LDLDIRECT in the last 72 hours. Thyroid Function Tests: No results for input(s): TSH, T4TOTAL, FREET4, T3FREE, THYROIDAB in the last 72 hours. Anemia Panel: Recent Labs    08/25/18 0841 08/26/18 0944  FERRITIN 3,164* 3,647*  Sepsis Labs: Recent Labs  Lab 08/23/18 1053 08/24/18 0134  PROCALCITON  --  0.36  LATICACIDVEN 1.3  --     Recent Results (from the past 240 hour(s))  SARS Coronavirus 2 (CEPHEID - Performed in [facility] hospital lab), Hosp Order     Status: Abnormal   Collection Time: 08/23/18  9:12 AM   Specimen: Nasopharyngeal Swab  Result Value Ref Range Status   SARS Coronavirus 2 POSITIVE (A) NEGATIVE Final    Comment: RESULT CALLED TO, READ BACK BY AND VERIFIED WITH: KIM GAULT AT 1117 08/23/2018.PMF (NOTE) If result is NEGATIVE SARS-CoV-2 target nucleic acids are NOT DETECTED. The SARS-CoV-2 RNA is generally detectable in upper and lower  respiratory specimens during the acute phase of infection. The lowest  concentration of SARS-CoV-2 viral copies this assay can detect is 250  copies / mL. A negative result does not preclude SARS-CoV-2 infection  and should not be used as the sole basis for  treatment or other  patient management decisions.  A negative result may occur with  improper specimen collection / handling, submission of specimen other  than nasopharyngeal swab, presence of viral mutation(s) within the  areas targeted by this assay, and inadequate number of viral copies  (<250 copies / mL). A negative result must be combined with clinical  observations, patient history, and epidemiological information. If result is POSITIVE SARS-CoV-2 target nucleic acids are DETECTED. The S ARS-CoV-2 RNA is generally detectable in upper and lower  respiratory specimens during the acute phase of infection.  Positive  results are indicative of active infection with SARS-CoV-2.  Clinical  correlation with patient history and other diagnostic information is  necessary to determine patient infection status.  Positive results do  not rule out bacterial infection or co-infection with other viruses. If result is PRESUMPTIVE POSTIVE SARS-CoV-2 nucleic acids MAY BE PRESENT.   A presumptive positive result was obtained on the submitted specimen  and confirmed on repeat testing.  While 2019 novel coronavirus  (SARS-CoV-2) nucleic acids may be present in the submitted sample  additional confirmatory testing may be necessary for epidemiological  and / or clinical management purposes  to differentiate between  SARS-CoV-2 and other Sarbecovirus currently known to infect humans.  If clinically indicated additional testing with an alternate test  methodology [phone]) is adv ised. The SARS-CoV-2 RNA is generally  detectable in upper and lower respiratory specimens during the acute  phase of infection. The expected result is Negative. Fact Sheet for Patients:  StrictlyIdeas.no Fact Sheet for Healthcare Providers: BankingDealers.co.za This test is not yet approved or cleared by the [place] FDA and has been authorized for detection and/or  diagnosis of SARS-CoV-2 by FDA under an Emergency Use Authorization (EUA).  This EUA will remain in effect (meaning this test can be used) for the duration of the COVID-19 declaration under Section 564(b)(1) of the Act, 21 U.S.C. section 360bbb-3(b)(1), unless the authorization is terminated or revoked sooner. Performed at [facility], [address] [place], [place] [address]   Culture, blood (routine x 2)     Status: None (Preliminary result)   Collection Time: 08/23/18  9:13 AM   Specimen: BLOOD  Result Value Ref Range Status   Specimen Description BLOOD L WRIST  Final   Special Requests   Final    BOTTLES DRAWN AEROBIC AND ANAEROBIC Blood Culture results may not be optimal due to an inadequate volume of blood received in culture bottles   Culture   Final  NO GROWTH 3 DAYS Performed at [facility], [address] [place], [place] [address]    Report Status PENDING  Incomplete  Culture, blood (routine x 2)     Status: None (Preliminary result)   Collection Time: 08/23/18  9:13 AM   Specimen: BLOOD  Result Value Ref Range Status   Specimen Description BLOOD L HAND  Final   Special Requests   Final    BOTTLES DRAWN AEROBIC AND ANAEROBIC Blood Culture results may not be optimal due to an inadequate volume of blood received in culture bottles   Culture   Final    NO GROWTH 3 DAYS Performed at [facility], [address], [place], [place] [address]    Report Status PENDING  Incomplete  Urine culture     Status: None   Collection Time: 08/23/18 12:09 PM   Specimen: Urine, Random  Result Value Ref Range Status   Specimen Description   Final    URINE, RANDOM Performed at [facility], [address] [place], [place] [address]    Special Requests   Final    NONE Performed at [facility], [address] [place], [place] [address]    Culture   Final    NO GROWTH Performed at [facility], [place] [address]  [place], [place] [address]    Report Status 08/25/2018 FINAL  Final  MRSA PCR Screening     Status: None   Collection Time: 08/23/18 11:45 PM   Specimen: Nasopharyngeal  Result Value Ref Range Status   MRSA by PCR NEGATIVE NEGATIVE Final    Comment:        The GeneXpert MRSA Assay (FDA approved for NASAL specimens only), is one component of a comprehensive MRSA colonization surveillance program. It is not intended to diagnose MRSA infection nor to guide or monitor treatment for MRSA infections. Performed at [facility], [place] [address] [place], [place] [address]      Radiology Studies: No results found.  Scheduled Meds:  aspirin EC  81 mg Oral Daily   atorvastatin  80 mg Oral q1800   calcium acetate  2,001 mg Oral TID WC   Chlorhexidine Gluconate Cloth  6 each Topical Q0600   [START ON 08/27/2018] darbepoetin (ARANESP) injection - DIALYSIS  60 mcg Intravenous Q Wed-HD   ferrous sulfate  325 mg Oral Q breakfast   gabapentin  600 mg Oral QHS   heparin  5,000 Units Subcutaneous Q8H   insulin aspart  0-5 Units Subcutaneous QHS   insulin aspart  0-9 Units Subcutaneous TID WC   insulin aspart  3 Units Subcutaneous TID WC   [START ON 08/27/2018] insulin glargine  45 Units Subcutaneous Daily   levothyroxine  75 mcg Oral Q0600   mouth rinse  15 mL Mouth Rinse BID   methylPREDNISolone (SOLU-MEDROL) injection  50 mg Intravenous Q12H   midodrine  10 mg Oral BID PC   pantoprazole  40 mg Oral Daily   traZODone  50 mg Oral QHS   vitamin C  500 mg Oral Daily   zinc sulfate  220 mg Oral Daily   Continuous Infusions:  sodium chloride     sodium chloride     cefTRIAXone (ROCEPHIN)  IV 1 g (08/26/18 0931)     LOS: 3 days   Marylu Lund, MD [facility] Pager On Amion  If 7PM-7AM, please contact night-coverage 08/26/2018, 3:31 PM

## 2018-08-26 NOTE — Progress Notes (Signed)
Inpatient Rehabilitation Admissions Coordinator  Inpatient rehab consult received. Noted positive COVID 6/20. I will follow at a distance to assist with planning dispo when appropriate. We admit pt's once patient post COVID per CDC guidelines.  Danne Baxter, RN, MSN Rehab Admissions Coordinator [phone] 08/26/2018 3:42 PM

## 2018-08-26 NOTE — Care Management Important Message (Signed)
Important Message  Patient Details  Name: Jamie Arias MRN: [id number] Date of Birth: 21-Jun-1955   Medicare Important Message Given:  Yes     Zenon Mayo, RN 08/26/2018, 3:19 PM

## 2018-08-26 NOTE — Progress Notes (Signed)
Inpatient Diabetes Program Recommendations  AACE/ADA: New Consensus Statement on Inpatient Glycemic Control (2015)  Target Ranges:  Prepandial:   less than 140 mg/dL      Peak postprandial:   less than 180 mg/dL (1-2 hours)      Critically ill patients:  140 - 180 mg/dL   Lab Results  Component Value Date   GLUCAP 297 (H) 08/26/2018   HGBA1C 7.1 08/16/2017    Review of Glycemic Control Results for Jamie Arias, Jamie Arias (MRN [id number]) as of 08/26/2018 13:11  Ref. Range 08/25/2018 12:05 08/25/2018 15:09 08/25/2018 20:35 08/26/2018 09:03 08/26/2018 12:13  Glucose-Capillary Latest Ref Range: 70 - 99 mg/dL 153 (H) 170 (H) 186 (H) 284 (H) 297 (H)   Diabetes history: DM 2 Outpatient Diabetes medications: Lantus 40 units daily Current orders for Inpatient glycemic control:  Lantus 40 units daily Novolog sensitive tid with meals and HS Solumedrol 50 mg IV q 12 hours  Inpatient Diabetes Program Recommendations:   While on steroids: -Increase Lantus to 45 units daily -Add Novolog 3 units tid meal coverage  Thank you, Bethena Roys E. Zerline Melchior, RN, MSN, CDE  Diabetes Coordinator Inpatient Glycemic Control Team Team Pager [phone] (8am-5pm) 08/26/2018 1:13 PM

## 2018-08-27 LAB — C-REACTIVE PROTEIN: CRP: 10.6 mg/dL — ABNORMAL HIGH (ref <1.0)

## 2018-08-27 LAB — GLUCOSE, CAPILLARY
Glucose-Capillary: 296 mg/dL — ABNORMAL HIGH (ref 70–99)
Glucose-Capillary: 157 mg/dL — ABNORMAL HIGH (ref 70–99)
Glucose-Capillary: 136 mg/dL — ABNORMAL HIGH (ref 70–99)

## 2018-08-27 LAB — FERRITIN: Ferritin: 3352 ng/mL — ABNORMAL HIGH (ref 11–307)

## 2018-08-27 MED ORDER — RENA-VITE PO TABS
1.0000 | ORAL_TABLET | Freq: Every day | ORAL | Status: DC
  Administered 2018-08-27 – 2018-09-14 (×19): 1 via ORAL
  Filled 2018-08-27 (×19): qty 1

## 2018-08-27 MED ORDER — POLYETHYLENE GLYCOL 3350 17 G PO PACK
17.0000 g | PACK | Freq: Every day | ORAL | Status: DC
  Administered 2018-08-28 – 2018-09-15 (×17): 17 g via ORAL
  Filled 2018-08-27 (×20): qty 1

## 2018-08-27 MED ORDER — NEPRO/CARBSTEADY PO LIQD
237.0000 mL | Freq: Two times a day (BID) | ORAL | Status: DC
  Administered 2018-08-27 – 2018-09-06 (×13): 237 mL via ORAL

## 2018-08-27 MED ORDER — ONDANSETRON HCL 4 MG/2ML IJ SOLN
4.0000 mg | Freq: Four times a day (QID) | INTRAMUSCULAR | Status: DC | PRN
  Administered 2018-08-27 – 2018-09-13 (×7): 4 mg via INTRAVENOUS
  Filled 2018-08-27 (×9): qty 2

## 2018-08-27 MED ORDER — OXYCODONE HCL 5 MG PO TABS
5.0000 mg | ORAL_TABLET | Freq: Four times a day (QID) | ORAL | Status: DC | PRN
  Administered 2018-08-27 – 2018-09-03 (×4): 5 mg via ORAL
  Filled 2018-08-27 (×6): qty 1

## 2018-08-27 NOTE — Progress Notes (Signed)
PROGRESS NOTE  Jamie Arias WPY:[id number] DOB: 1955-03-22 DOA: 08/23/2018 PCP: [name], MD  HPI/Recap of past 24 hours: 63 y.o.femalewith medical history significant ofESRD on HDMWF,anemia, chronic diastolic congestive heart failure, CAD status post PCI, type 2 diabetes, hypertension, hyperlipidemia, hypothyroidism, and conditions listed belowpresenting as a transfer from [facility]. COVID-19positive. Patient transferred to [facility] for management of COVID-19 and hemodialysis.Patient states her last dialysis was on Monday, June 15 and since then she has not been able to go because she is not feeling well. Reports having generalized weakness. Reports having intermittent tremors for the past few months. Denies family history of tremors. Denies any fevers, chills, chest pain, shortness of breath, cough, nausea, vomiting, abdominal pain, diarrhea, dysuria, or urinary frequency/urgency. No additional history could be obtained from her.  08/27/18: Patient seen and examined at her bedside.  Reports mild headache, not significant per the patient.  Also reports dizziness.  She denies any recent falls.  Later this morning bedside RN reports nausea with no vomiting and lower back pain. Added IV zofran prn and oxycodone prn for severe lower back pain.  Blood pressure is stable on Midodrine.  Assessment/Plan: Principal Problem:   COVID-19 virus infection Active Problems:   HTN (hypertension)   ESRD (end stage renal disease) (HCC)   Acute on chronic respiratory failure with hypoxia (HCC)   UTI (urinary tract infection)  Acute COVID-19 related pneumonia Presented with a fever Chest x-ray and CT chest done on 08/24/2018 showed bilateral patchy infiltrates worse on the left lower lobe, independently reviewed O2 saturation improving but not all back to her baseline. Maintain O2 saturation greater than 92% Continue inhaler and IV steroids  Acute hypoxic respiratory failure  likely secondary to acute COVID-19 related pneumonia Presented with hypoxia with O2 saturation 85% on room air Improving Continue management as stated above  Hypotension in the setting of ESRD on dialysis Continue midodrine Maintain map greater than 65  ESRD on HD Monday Wednesday Friday Nephrology consulted and following Possible hemodialysis today Closely monitor blood pressure  Intractable nausea with no vomiting IV Zofran PRN 3 times daily  Chronic lower back pain P.o. oxycodone for severe pain.  Added bowel regimen to avoid constipation Tylenol for mild to moderate pain  Anemia of chronic disease likely secondary to ESRD Hemoglobin stable at 9 No sign of active bleeding  Type 2 diabetes with hyperglycemia Last hemoglobin A1c 7.1 on 08/16/17 Continue insulin sliding scale Obtain hemoglobin A1c  Physical deconditioning -PT/OT consulted with recommendation for CIR, have placed order  DVT prophylaxis: Heparin subq 3 times daily Code Status: Full Family Communication:  Will call if okay with the patient. Disposition Plan:  Possible discharge to CIR when nephrology signs off and repeat COVID-19 negative per CIR policy.  Consultants:   Nephrology  Procedures:     Antimicrobials:             Anti-infectives (From admission, onward)       Start        Dose/Rate  Route  Frequency  Ordered  Stop     08/25/18 1000    cefTRIAXone (ROCEPHIN) 1 g in sodium chloride 0.9 % 100 mL IVPB       1 g  200 mL/hr over 30 Minutes  Intravenous  Every 24 hours  08/24/18 0022        08/24/18 0500    azithromycin (ZITHROMAX) 500 mg in sodium chloride 0.9 % 250 mL IVPB  Status:  Discontinued  500 mg  250 mL/hr over 60 Minutes  Intravenous  Every 24 hours  08/24/18 0453  08/24/18 0454          Objective: Vitals:   08/27/18 0000 08/27/18 0200 08/27/18 0504 08/27/18 0821  BP: (!) 101/44 (!) 124/42 (!) 107/47 (!) 107/42  Pulse: (!)  56   66  Resp: 18   18  Temp: (!) 97.5 F (36.4 C)   98.6 F (37 C)  TempSrc: Oral   Axillary  SpO2: 93%   94%  Weight:      Height:        Intake/Output Summary (Last 24 hours) at 08/27/2018 1228 Last data filed at 08/27/2018 0908 Gross per 24 hour  Intake 240 ml  Output -  Net 240 ml   Filed Weights   08/23/18 2244 08/25/18 0750 08/25/18 1200  Weight: 96.6 kg 99.2 kg 98 kg    Exam:  . General: 63 y.o. year-old female well developed well nourished in no acute distress.  Alert and oriented x3. . Cardiovascular: Regular rate and rhythm with no rubs or gallops.  No thyromegaly or JVD noted.   Marland Kitchen Respiratory: Clear to auscultation with no wheezes or rales. Good inspiratory effort. . Abdomen: Obese with normal bowel sounds x4 quadrants. . Musculoskeletal: No lower extremity edema. 2/4 pulses in all 4 extremities. Marland Kitchen Psychiatry: Mood is appropriate for condition and setting   Data Reviewed: CBC: Recent Labs  Lab 08/23/18 1325 08/25/18 0841  WBC 5.0 6.3  NEUTROABS 4.1  --   HGB 9.5* 9.2*  HCT 29.3* 27.6*  MCV 99.0 95.8  PLT 121* 462*   Basic Metabolic Panel: Recent Labs  Lab 08/23/18 0912 08/25/18 0841 08/26/18 0944  NA 132* 129* 135  K 5.1 6.1* 5.2*  CL 88* 88* 94*  CO2 24 17* 20*  GLUCOSE 153* 474* 327*  BUN 77* 117* 57*  CREATININE 15.71* 19.53* 10.59*  CALCIUM 7.2* 7.0* 7.6*  PHOS  --  10.0*  --    GFR: Estimated Creatinine Clearance: 6.1 mL/min (A) (by C-G formula based on SCr of 10.59 mg/dL (H)). Liver Function Tests: Recent Labs  Lab 08/23/18 0912 08/25/18 0841  AST 28  --   ALT 16  --   ALKPHOS 45  --   BILITOT 0.9  --   PROT 6.8  --   ALBUMIN 3.3* 2.8*   No results for input(s): LIPASE, AMYLASE in the last 168 hours. Recent Labs  Lab 08/23/18 1103  AMMONIA 20   Coagulation Profile: No results for input(s): INR, PROTIME in the last 168 hours. Cardiac Enzymes: Recent Labs  Lab 08/23/18 0912 08/23/18 1325 08/24/18 0134  TROPONINI  0.03* 0.03* 0.03*   BNP (last 3 results) No results for input(s): PROBNP in the last 8760 hours. HbA1C: No results for input(s): HGBA1C in the last 72 hours. CBG: Recent Labs  Lab 08/26/18 0903 08/26/18 1213 08/26/18 1708 08/26/18 2055 08/27/18 0818  GLUCAP 284* 297* 369* 341* 296*   Lipid Profile: No results for input(s): CHOL, HDL, LDLCALC, TRIG, CHOLHDL, LDLDIRECT in the last 72 hours. Thyroid Function Tests: No results for input(s): TSH, T4TOTAL, FREET4, T3FREE, THYROIDAB in the last 72 hours. Anemia Panel: Recent Labs    08/26/18 0944 08/27/18 0558  FERRITIN 3,647* 3,352*   Urine analysis:    Component Value Date/Time   COLORURINE YELLOW (A) 08/23/2018 1209   APPEARANCEUR CLOUDY (A) 08/23/2018 1209   APPEARANCEUR Hazy 07/23/2013 0741   LABSPEC 1.016 08/23/2018 1209  LABSPEC 1.016 07/23/2013 0741   PHURINE 5.0 08/23/2018 1209   GLUCOSEU NEGATIVE 08/23/2018 1209   GLUCOSEU Negative 07/23/2013 0741   HGBUR SMALL (A) 08/23/2018 1209   BILIRUBINUR NEGATIVE 08/23/2018 1209   BILIRUBINUR Negative 07/23/2013 0741   KETONESUR NEGATIVE 08/23/2018 1209   PROTEINUR 100 (A) 08/23/2018 1209   UROBILINOGEN 0.2 06/30/2013 2213   NITRITE NEGATIVE 08/23/2018 1209   LEUKOCYTESUR SMALL (A) 08/23/2018 1209   LEUKOCYTESUR Negative 07/23/2013 0741   Sepsis Labs: @LABRCNTIP (procalcitonin:4,lacticidven:4)  ) Recent Results (from the past 240 hour(s))  SARS Coronavirus 2 (CEPHEID - Performed in [facility] hospital lab), Hosp Order     Status: Abnormal   Collection Time: 08/23/18  9:12 AM   Specimen: Nasopharyngeal Swab  Result Value Ref Range Status   SARS Coronavirus 2 POSITIVE (A) NEGATIVE Final    Comment: RESULT CALLED TO, READ BACK BY AND VERIFIED WITH: KIM GAULT AT 1117 08/23/2018.PMF (NOTE) If result is NEGATIVE SARS-CoV-2 target nucleic acids are NOT DETECTED. The SARS-CoV-2 RNA is generally detectable in upper and lower  respiratory specimens during the acute  phase of infection. The lowest  concentration of SARS-CoV-2 viral copies this assay can detect is 250  copies / mL. A negative result does not preclude SARS-CoV-2 infection  and should not be used as the sole basis for treatment or other  patient management decisions.  A negative result may occur with  improper specimen collection / handling, submission of specimen other  than nasopharyngeal swab, presence of viral mutation(s) within the  areas targeted by this assay, and inadequate number of viral copies  (<250 copies / mL). A negative result must be combined with clinical  observations, patient history, and epidemiological information. If result is POSITIVE SARS-CoV-2 target nucleic acids are DETECTED. The S ARS-CoV-2 RNA is generally detectable in upper and lower  respiratory specimens during the acute phase of infection.  Positive  results are indicative of active infection with SARS-CoV-2.  Clinical  correlation with patient history and other diagnostic information is  necessary to determine patient infection status.  Positive results do  not rule out bacterial infection or co-infection with other viruses. If result is PRESUMPTIVE POSTIVE SARS-CoV-2 nucleic acids MAY BE PRESENT.   A presumptive positive result was obtained on the submitted specimen  and confirmed on repeat testing.  While 2019 novel coronavirus  (SARS-CoV-2) nucleic acids may be present in the submitted sample  additional confirmatory testing may be necessary for epidemiological  and / or clinical management purposes  to differentiate between  SARS-CoV-2 and other Sarbecovirus currently known to infect humans.  If clinically indicated additional testing with an alternate test  methodology [phone]) is adv ised. The SARS-CoV-2 RNA is generally  detectable in upper and lower respiratory specimens during the acute  phase of infection. The expected result is Negative. Fact Sheet for Patients:   StrictlyIdeas.no Fact Sheet for Healthcare Providers: BankingDealers.co.za This test is not yet approved or cleared by the [place] FDA and has been authorized for detection and/or diagnosis of SARS-CoV-2 by FDA under an Emergency Use Authorization (EUA).  This EUA will remain in effect (meaning this test can be used) for the duration of the COVID-19 declaration under Section 564(b)(1) of the Act, 21 U.S.C. section 360bbb-3(b)(1), unless the authorization is terminated or revoked sooner. Performed at [facility], [address] [place], [place] [address]   Culture, blood (routine x 2)     Status: None (Preliminary result)   Collection Time: 08/23/18  9:13 AM   Specimen: BLOOD  Result Value Ref Range Status   Specimen Description BLOOD L WRIST  Final   Special Requests   Final    BOTTLES DRAWN AEROBIC AND ANAEROBIC Blood Culture results may not be optimal due to an inadequate volume of blood received in culture bottles   Culture   Final    NO GROWTH 4 DAYS Performed at [facility], [address], [place], [place] [address]    Report Status PENDING  Incomplete  Culture, blood (routine x 2)     Status: None (Preliminary result)   Collection Time: 08/23/18  9:13 AM   Specimen: BLOOD  Result Value Ref Range Status   Specimen Description BLOOD L HAND  Final   Special Requests   Final    BOTTLES DRAWN AEROBIC AND ANAEROBIC Blood Culture results may not be optimal due to an inadequate volume of blood received in culture bottles   Culture   Final    NO GROWTH 4 DAYS Performed at [facility], [address], [place], [place] [address]    Report Status PENDING  Incomplete  Urine culture     Status: None   Collection Time: 08/23/18 12:09 PM   Specimen: Urine, Random  Result Value Ref Range Status   Specimen Description   Final    URINE, RANDOM Performed at [facility], [address]  [place], [place] [address]    Special Requests   Final    NONE Performed at [facility], [address], [place], [place] [address]    Culture   Final    NO GROWTH Performed at [facility], [place] [address] [place], [place] [address]    Report Status 08/25/2018 FINAL  Final  MRSA PCR Screening     Status: None   Collection Time: 08/23/18 11:45 PM   Specimen: Nasopharyngeal  Result Value Ref Range Status   MRSA by PCR NEGATIVE NEGATIVE Final    Comment:        The GeneXpert MRSA Assay (FDA approved for NASAL specimens only), is one component of a comprehensive MRSA colonization surveillance program. It is not intended to diagnose MRSA infection nor to guide or monitor treatment for MRSA infections. Performed at [facility], [place] [address], [place], [place] [address]       Studies: No results found.  Scheduled Meds: . aspirin EC  81 mg Oral Daily  . atorvastatin  80 mg Oral q1800  . calcium acetate  2,001 mg Oral TID WC  . Chlorhexidine Gluconate Cloth  6 each Topical Q0600  . Chlorhexidine Gluconate Cloth  6 each Topical Q0600  . darbepoetin (ARANESP) injection - DIALYSIS  60 mcg Intravenous Q Wed-HD  . feeding supplement (NEPRO CARB STEADY)  237 mL Oral BID BM  . ferrous sulfate  325 mg Oral Q breakfast  . gabapentin  600 mg Oral QHS  . heparin  5,000 Units Subcutaneous Q8H  . insulin aspart  0-5 Units Subcutaneous QHS  . insulin aspart  0-9 Units Subcutaneous TID WC  . insulin aspart  3 Units Subcutaneous TID WC  . insulin glargine  45 Units Subcutaneous Daily  . levothyroxine  75 mcg Oral Q0600  . mouth rinse  15 mL Mouth Rinse BID  . methylPREDNISolone (SOLU-MEDROL) injection  50 mg Intravenous Q12H  . midodrine  10 mg Oral BID PC  . multivitamin  1 tablet Oral QHS  . pantoprazole  40 mg  Oral Daily  . polyethylene glycol  17 g Oral Daily  . traZODone  50 mg Oral QHS  . vitamin C  500 mg Oral Daily  . zinc sulfate  220 mg Oral Daily     Continuous Infusions: . sodium chloride 100 mL (08/27/18 0519)  . sodium chloride    . cefTRIAXone (ROCEPHIN)  IV 1 g (08/27/18 0908)     LOS: 4 days     Kayleen Memos, MD [facility] Pager [phone]  If 7PM-7AM, please contact night-coverage [email or web address] Password [facility] 08/27/2018, 12:28 PM

## 2018-08-27 NOTE — Progress Notes (Signed)
PT Cancellation Note  Patient Details Name: Jamie Arias MRN: [id number] DOB: Mar 08, 1955   Cancelled Treatment:      patient currently receiving HD, will follow up as schedule permits.   Reinaldo Berber, PT, DPT Acute Rehabilitation Services Pager: [phone] Office: [phone]     Reinaldo Berber 08/27/2018, 4:06 PM

## 2018-08-27 NOTE — Progress Notes (Signed)
[facility] Progress Note   63 year old female patient with a past medical history significant for end-stage renal disease on hemodialysis Monday, Wednesday, and Friday presented for dialysis in transfer from [place].  She presented on 08/25/18, with her last dialysis being the prior Monday.  She had progressive weakness likely secondary to COVID-19, and was unable to make it to dialysis.  Assessment/Plan: 1. COVID -19 - isolation HD/ care per primary - on 1 L O2 at present  2. ESRD -MWF K 5.2 HD today - labs to be drawn pre HD 3. Anemia - hgb 9.2 Aranesp 60 today - on po Fe-  4. Secondary hyperparathyroidism - P 10 Ca 7.6 on ca acetate - will see if P improves 5. HTN/volume - BP down overnight still lowish this am - midodrine for BP support bid ; off amlodipine  6. Nutrition - alb 2.8 add renavite/nepro - also on high dose C and Zn sulfate -  7. Possible UTI - s/p empiric ceftriaxone - no culture growth 8. Hypothyroidism - on synthroid 9. DM - per primary  Myriam Jacobson, PA-C [facility] [phone] 08/27/2018,9:30 AM  LOS: 4 days   Subjective:   Due to the nature of this patient's suspected COVID-19 with isolation and in keeping with efforts to prevent the spread of infection and to conserve personal protective equipment, a physical exam was not personally performed.  Patient's symptoms and exam were discussed in detail with the RN.  A chart review of other providers notes and the patient's lab work as well as review of other pertinent studies was performed.  Exam details from prior documentation were reviewed specifically and confirmed with the bedside nurse.  [facility]   Discussed with RN. Some nausea and HA this am - ate little of breakfast.  HAs chronic back pain predating admission - given hot pack.  On 1 L O2.  Objective Vitals:   08/27/18 0000 08/27/18 0200 08/27/18 0504 08/27/18 0821  BP: (!) 101/44 (!) 124/42  (!) 107/47 (!) 107/42  Pulse: (!) 56   66  Resp: 18   18  Temp: (!) 97.5 F (36.4 C)   98.6 F (37 C)  TempSrc: Oral   Axillary  SpO2: 93%   94%  Weight:      Height:       Physical Exam deferred due to COVID status and to protect PEE.   Additional Objective Labs: Basic Metabolic Panel: Recent Labs  Lab 08/23/18 0912 08/25/18 0841 08/26/18 0944  NA 132* 129* 135  K 5.1 6.1* 5.2*  CL 88* 88* 94*  CO2 24 17* 20*  GLUCOSE 153* 474* 327*  BUN 77* 117* 57*  CREATININE 15.71* 19.53* 10.59*  CALCIUM 7.2* 7.0* 7.6*  PHOS  --  10.0*  --    Liver Function Tests: Recent Labs  Lab 08/23/18 0912 08/25/18 0841  AST 28  --   ALT 16  --   ALKPHOS 45  --   BILITOT 0.9  --   PROT 6.8  --   ALBUMIN 3.3* 2.8*   No results for input(s): LIPASE, AMYLASE in the last 168 hours. CBC: Recent Labs  Lab 08/23/18 1325 08/25/18 0841  WBC 5.0 6.3  NEUTROABS 4.1  --   HGB 9.5* 9.2*  HCT 29.3* 27.6*  MCV 99.0 95.8  PLT 121* 147*   Blood Culture    Component Value Date/Time   SDES  08/23/2018 1209    URINE, RANDOM  Performed at [facility], [address] [place], [place] [address]    [facility]  08/23/2018 1209    NONE Performed at [facility], [address] [place], [place] [address]    CULT  08/23/2018 1209    NO GROWTH Performed at [facility] [address], [place], [place] [address]    REPTSTATUS 08/25/2018 FINAL 08/23/2018 1209    Cardiac Enzymes: Recent Labs  Lab 08/23/18 0912 08/23/18 1325 08/24/18 0134  TROPONINI 0.03* 0.03* 0.03*   CBG: Recent Labs  Lab 08/26/18 0903 08/26/18 1213 08/26/18 1708 08/26/18 2055 08/27/18 0818  GLUCAP 284* 297* 369* 341* 296*   Iron Studies:  Recent Labs    08/27/18 0558  FERRITIN 3,352*   Lab Results  Component Value Date   INR 0.99 01/30/2016   INR 1.19 07/13/2013   INR 1.27 07/12/2013   Studies/Results: No results found. Medications: . sodium chloride 100 mL (08/27/18  0519)  . sodium chloride    . cefTRIAXone (ROCEPHIN)  IV 1 g (08/27/18 0908)   . aspirin EC  81 mg Oral Daily  . atorvastatin  80 mg Oral q1800  . calcium acetate  2,001 mg Oral TID WC  . Chlorhexidine Gluconate Cloth  6 each Topical Q0600  . Chlorhexidine Gluconate Cloth  6 each Topical Q0600  . darbepoetin (ARANESP) injection - DIALYSIS  60 mcg Intravenous Q Wed-HD  . ferrous sulfate  325 mg Oral Q breakfast  . gabapentin  600 mg Oral QHS  . heparin  5,000 Units Subcutaneous Q8H  . insulin aspart  0-5 Units Subcutaneous QHS  . insulin aspart  0-9 Units Subcutaneous TID WC  . insulin aspart  3 Units Subcutaneous TID WC  . insulin glargine  45 Units Subcutaneous Daily  . levothyroxine  75 mcg Oral Q0600  . mouth rinse  15 mL Mouth Rinse BID  . methylPREDNISolone (SOLU-MEDROL) injection  50 mg Intravenous Q12H  . midodrine  10 mg Oral BID PC  . pantoprazole  40 mg Oral Daily  . traZODone  50 mg Oral QHS  . vitamin C  500 mg Oral Daily  . zinc sulfate  220 mg Oral Daily

## 2018-08-27 NOTE — Progress Notes (Signed)
Notified NP Baltazar Najjar x3 about low blood pressures during the night. No additional orders received and was told no interventions needed for pt's blood pressure. Will continue to monitor and assess pt.

## 2018-08-27 NOTE — Progress Notes (Signed)
OT Cancellation Note  Patient Details Name: SAKIA SCHRIMPF MRN: [id number] DOB: 1955/11/01   Cancelled Treatment:    Reason Eval/Treat Not Completed: Patient at procedure or test/ unavailable, currently receiving HD. Will follow up for OT treatment as schedule permits.  Lou Cal, OT Supplemental Rehabilitation Services Pager [phone] Office [phone]   Raymondo Band 08/27/2018, 4:02 PM

## 2018-08-28 DIAGNOSIS — N186 End stage renal disease: Secondary | ICD-10-CM

## 2018-08-28 DIAGNOSIS — U071 COVID-19: Principal | ICD-10-CM

## 2018-08-28 DIAGNOSIS — I4819 Other persistent atrial fibrillation: Secondary | ICD-10-CM

## 2018-08-28 LAB — C-REACTIVE PROTEIN: CRP: 11.5 mg/dL — ABNORMAL HIGH (ref <1.0)

## 2018-08-28 LAB — RENAL FUNCTION PANEL
Albumin: 2.8 g/dL — ABNORMAL LOW (ref 3.5–5.0)
Anion gap: 16 — ABNORMAL HIGH (ref 5–15)
BUN: 41 mg/dL — ABNORMAL HIGH (ref 8–23)
CO2: 24 mmol/L (ref 22–32)
Calcium: 8.8 mg/dL — ABNORMAL LOW (ref 8.9–10.3)
Chloride: 95 mmol/L — ABNORMAL LOW (ref 98–111)
Creatinine, Ser: 7.65 mg/dL — ABNORMAL HIGH (ref 0.44–1.00)
GFR calc Af Amer: 6 mL/min — ABNORMAL LOW (ref >60)
GFR calc non Af Amer: 5 mL/min — ABNORMAL LOW (ref >60)
Glucose, Bld: 208 mg/dL — ABNORMAL HIGH (ref 70–99)
Phosphorus: 5.7 mg/dL — ABNORMAL HIGH (ref 2.5–4.6)
Potassium: 4.9 mmol/L (ref 3.5–5.1)
Sodium: 135 mmol/L (ref 135–145)

## 2018-08-28 LAB — CBC
HCT: 30.2 % — ABNORMAL LOW (ref 36.0–46.0)
Hemoglobin: 9.9 g/dL — ABNORMAL LOW (ref 12.0–15.0)
MCH: 31.9 pg (ref 26.0–34.0)
MCHC: 32.8 g/dL (ref 30.0–36.0)
MCV: 97.4 fL (ref 80.0–100.0)
Platelets: 234 10*3/uL (ref 150–400)
RBC: 3.1 MIL/uL — ABNORMAL LOW (ref 3.87–5.11)
RDW: 14.4 % (ref 11.5–15.5)
WBC: 10.6 10*3/uL — ABNORMAL HIGH (ref 4.0–10.5)
nRBC: 0 % (ref 0.0–0.2)

## 2018-08-28 LAB — GLUCOSE, CAPILLARY
Glucose-Capillary: 276 mg/dL — ABNORMAL HIGH (ref 70–99)
Glucose-Capillary: 266 mg/dL — ABNORMAL HIGH (ref 70–99)
Glucose-Capillary: 165 mg/dL — ABNORMAL HIGH (ref 70–99)
Glucose-Capillary: 190 mg/dL — ABNORMAL HIGH (ref 70–99)

## 2018-08-28 LAB — T4, FREE: Free T4: 1.05 ng/dL (ref 0.61–1.12)

## 2018-08-28 LAB — FERRITIN: Ferritin: 4127 ng/mL — ABNORMAL HIGH (ref 11–307)

## 2018-08-28 LAB — MAGNESIUM: Magnesium: 2.2 mg/dL (ref 1.7–2.4)

## 2018-08-28 LAB — TSH: TSH: 0.194 u[IU]/mL — ABNORMAL LOW (ref 0.350–4.500)

## 2018-08-28 MED ORDER — LEVOTHYROXINE SODIUM 50 MCG PO TABS
50.0000 ug | ORAL_TABLET | Freq: Every day | ORAL | Status: DC
  Administered 2018-08-29 – 2018-09-15 (×18): 50 ug via ORAL
  Filled 2018-08-28 (×19): qty 1

## 2018-08-28 MED ORDER — AMIODARONE HCL 200 MG PO TABS
400.0000 mg | ORAL_TABLET | Freq: Two times a day (BID) | ORAL | Status: DC
  Administered 2018-08-28 – 2018-09-04 (×14): 400 mg via ORAL
  Filled 2018-08-28 (×14): qty 2

## 2018-08-28 MED ORDER — CHLORHEXIDINE GLUCONATE CLOTH 2 % EX PADS
6.0000 | MEDICATED_PAD | Freq: Every day | CUTANEOUS | Status: DC
  Administered 2018-08-29 – 2018-08-31 (×2): 6 via TOPICAL

## 2018-08-28 NOTE — Progress Notes (Signed)
Rapid response paged. Patient responses are delayed and answered "What year is it?" orientation question incorrectly several times. VS stable at this time.

## 2018-08-28 NOTE — Significant Event (Signed)
Rapid Response Event Note  Overview: Time Called: 1208 Arrival Time: 1215 Event Type: Neurologic  Initial Focused Assessment: Patient admitted with PNA, covid (+)  This am she is less interactive with staff. Upon my arrival she states that she is "sick".  She answers slowly and stops talking frequently.  Each time I reengage with her she answers.  After a few minutes of encouraging her to talk she became tearful and stated that she was sad that she was in the hospital.  Able to answer orientation questions appropriately. No focal deficits noted, she seems fatigued.  No signs of respiratory distress or shortness of breath.  BP 129/56 HR 97  RR 18  O2 sat 98% on 2L   Interventions: Therapeutic conversation, encouraged her to describe how she is feeling. Assisted her to sitting on the side of the bed, able to stand with the walker but sat back down quickly,  Not sure if it was lack of effort or true weakness.   Sat on the side of the bed with some assist for about 10 min.   She spoke with multiple family members on the phone, had brief conversation with them. She spoke with MD via phone.  Assisted back to lying in the bed then, bed placed in chair position.  Encouraged her to eat lunch.   Plan of Care (if not transferred): RN to call if assistance needed.  Event Summary: Name of Physician Notified: Dr Nevada Crane at 1210    at    Outcome: Stayed in room and stabalized  Event End Time: [place]  Raliegh Ip

## 2018-08-28 NOTE — Progress Notes (Signed)
Updated the patient's spouse on the phone. All questions answered to his satisfaction. Will call again tomorrow for updates.

## 2018-08-28 NOTE — Progress Notes (Signed)
Physical Therapy Treatment Patient Details Name: Jamie Arias MRN: [id number] DOB: 06/27/1955 Today's Date: 08/28/2018    History of Present Illness Jamie Arias is a 63 y.o. female with medical history significant of ESRD on HD MWF, anemia, chronic diastolic congestive heart failure, CAD status post PCI, type 2 diabetes, hypertension, hyperlipidemia, hypothyroidism, and conditions listed below presenting as a transfer from [facility].  COVID-19 positive.  Patient transferred to [facility] for management of COVID-19 and hemodialysis.     PT Comments    Pt admitted with above diagnosis. Pt currently with functional limitations due to balance and endurance deficits. Pt was able to sit EOB with max to total assist with pt losing balance posterilor most of session.  Pt lethargic and not following commands with decr responses as well.  Sat EOB 10 min total . Notified nursing that pt was lethargic and that sats on arrival were 87% on RA and replaced 3L to keep sats >90%.  Pt will benefit from skilled PT to increase their independence and safety with mobility to allow discharge to the venue listed below.     Follow Up Recommendations  CIR;Other (comment)(Post-acute Rehabilitation)     Equipment Recommendations  Rolling walker with 5" wheels;3in1 (PT)    Recommendations for Other Services OT consult(Will order per protocol)     Precautions / Restrictions Precautions Precautions: Fall Precaution Comments: Dyskinesia-like movement, large tremors Restrictions Weight Bearing Restrictions: No    Mobility  Bed Mobility Overal bed mobility: Needs Assistance Bed Mobility: Supine to Sit;Sit to Supine     Supine to sit: Max assist;Total assist;+2 for physical assistance;+2 for safety/equipment Sit to supine: Total assist;+2 for physical assistance;+2 for safety/equipment   General bed mobility comments: pt requires max multimodal cues to follow commands; requires significant assist for LEs  over EOB and trunk elevation; totalA+2 to return to supine  Transfers                 General transfer comment: unable due to level of arousal   Ambulation/Gait             General Gait Details: Unable today   Stairs             Wheelchair Mobility    Modified Rankin (Stroke Patients Only)       Balance Overall balance assessment: Needs assistance;History of Falls Sitting-balance support: Single extremity supported;Bilateral upper extremity supported;Feet supported Sitting balance-Leahy Scale: Poor Sitting balance - Comments: pt briefly able to maintain static balance with minguard assist, delayed righting reactions and often with posterior lean; requires up to max-totalA for sitting balance at times Postural control: Posterior lean                                  Cognition Arousal/Alertness: Lethargic Behavior During Therapy: Flat affect Overall Cognitive Status: Impaired/Different from baseline Area of Impairment: Orientation;Attention;Memory;Following commands;Awareness                   Current Attention Level: Focused Memory: Decreased short-term memory Following Commands: Follows one step commands inconsistently   Awareness: Intellectual   General Comments: pt very lethargic this session and requires max cues to arouse and remain awake; pt responds "yes" when calling her name, when asked for her full name, DOB, or current location pt does not respond; will occasionally respond to other yes/no questions but very minimally; pt with decreased command following and significant delay  when she does follow through with command (approx 5-7 sec delay); at times pt with glazed over look and blank stare, RN made aware      Exercises      General Comments General comments (skin integrity, edema, etc.): pt not on O2 upon arrival to room, spO2 88%, applied 2L initially but bumping up to 3L to maintain sats >90% with activity during  session. BP monitored and noted max BP 144/124 seated EOB      Pertinent Vitals/Pain Pain Assessment: Faces Faces Pain Scale: Hurts little more Pain Location: headache, intermittently Pain Descriptors / Indicators: Headache;Grimacing Pain Intervention(s): Limited activity within patient's tolerance;Monitored during session;Repositioned    Home Living                      Prior Function            PT Goals (current goals can now be found in the care plan section) Acute Rehab PT Goals Patient Stated Goal: none stated today Progress towards PT goals: Not progressing toward goals - comment(incr lethargy)    Frequency    Min 3X/week      PT Plan Current plan remains appropriate    Co-evaluation PT/OT/SLP Co-Evaluation/Treatment: Yes Reason for Co-Treatment: Complexity of the patient's impairments (multi-system involvement);For patient/therapist safety;Necessary to address cognition/behavior during functional activity PT goals addressed during session: Mobility/safety with mobility OT goals addressed during session: ADL's and self-care      AM-PAC PT "6 Clicks" Mobility   Outcome Measure  Help needed turning from your back to your side while in a flat bed without using bedrails?: A Little Help needed moving from lying on your back to sitting on the side of a flat bed without using bedrails?: A Lot Help needed moving to and from a bed to a chair (including a wheelchair)?: A Lot Help needed standing up from a chair using your arms (e.g., wheelchair or bedside chair)?: Total Help needed to walk in hospital room?: Total Help needed climbing 3-5 steps with a railing? : Total 6 Click Score: 10    End of Session Equipment Utilized During Treatment: Gait belt;Oxygen Activity Tolerance: Patient limited by fatigue;Patient limited by lethargy Patient left: in bed;with call bell/phone within reach;with bed alarm set Nurse Communication: Mobility status(notified of pts  level of arousal ) PT Visit Diagnosis: Unsteadiness on feet (R26.81);Other abnormalities of gait and mobility (R26.89);Other symptoms and signs involving the nervous system (R29.898);Dizziness and giddiness (R42);History of falling (Z91.81)     Time: 3818-2993 PT Time Calculation (min) (ACUTE ONLY): 38 min  Charges:  $Therapeutic Activity: 23-37 mins                     Akin Yi,PT Acute Rehabilitation Services Pager:  [phone]  Office:  [place] 08/28/2018, 1:10 PM

## 2018-08-28 NOTE — Progress Notes (Signed)
[facility]    NEPHROLOGY PROGRESS NOTE  SUBJECTIVE:  She underwent HD on 6/24 and had 1 kg UF.   She was seen by cardiology for new a fib and started on amio. Patient did not pick up the phone for me and per nursing she has been sleepy today and was actually seen by rapid response.  She was on RA earlier today and then was put on 2 liters for working with OT (sats 88% then per report) and she has just weaned her back onto room air.   Due to the nature of this patient's suspected COVID-19 with isolation and in keeping with efforts to prevent the spread of infection and to conserve personal protective equipment, a physical exam was not personally performed.  Patient's symptoms and exam were discussed in detail with the RN.  A chart review of other providers notes and the patient's lab work as well as review of other pertinent studies was performed.  Exam details from prior documentation were reviewed specifically and confirmed with the bedside nurse.  Location of service: [facility]   OBJECTIVE:  Vitals:   08/28/18 1200 08/28/18 1300  BP: (!) 112/44 (!) 129/56  Pulse:    Resp:  18  Temp: 98.9 F (37.2 C)   SpO2:  98%   No intake or output data in the 24 hours ending 08/28/18 1736   MEDICATIONS:  . amiodarone  400 mg Oral BID  . aspirin EC  81 mg Oral Daily  . atorvastatin  80 mg Oral q1800  . calcium acetate  2,001 mg Oral TID WC  . Chlorhexidine Gluconate Cloth  6 each Topical Q0600  . Chlorhexidine Gluconate Cloth  6 each Topical Q0600  . darbepoetin (ARANESP) injection - DIALYSIS  60 mcg Intravenous Q Wed-HD  . feeding supplement (NEPRO CARB STEADY)  237 mL Oral BID BM  . ferrous sulfate  325 mg Oral Q breakfast  . gabapentin  600 mg Oral QHS  . heparin  5,000 Units Subcutaneous Q8H  . insulin aspart  0-5 Units Subcutaneous QHS  . insulin aspart  0-9 Units Subcutaneous TID WC  . insulin aspart  3 Units Subcutaneous TID WC  . insulin glargine  45 Units  Subcutaneous Daily  . [START ON 08/29/2018] levothyroxine  50 mcg Oral Q0600  . mouth rinse  15 mL Mouth Rinse BID  . methylPREDNISolone (SOLU-MEDROL) injection  50 mg Intravenous Q12H  . midodrine  10 mg Oral BID PC  . multivitamin  1 tablet Oral QHS  . pantoprazole  40 mg Oral Daily  . polyethylene glycol  17 g Oral Daily  . traZODone  50 mg Oral QHS  . vitamin C  500 mg Oral Daily  . zinc sulfate  220 mg Oral Daily    Exam:   Exam is per nursing.  Appreciate RNs assistance Adult female no acute distress More sleepy today  Clear but reduced breath sounds No lower extremity edema LUE AVF     LABS:   CBC Latest Ref Rng & Units 08/28/2018 08/25/2018 08/23/2018  WBC 4.0 - 10.5 K/uL 10.6(H) 6.3 5.0  Hemoglobin 12.0 - 15.0 g/dL 9.9(L) 9.2(L) 9.5(L)  Hematocrit 36.0 - 46.0 % 30.2(L) 27.6(L) 29.3(L)  Platelets 150 - 400 K/uL 234 147(L) 121(L)    CMP Latest Ref Rng & Units 08/28/2018 08/26/2018 08/25/2018  Glucose 70 - 99 mg/dL 208(H) 327(H) 474(H)  BUN 8 - 23 mg/dL 41(H) 57(H) 117(H)  Creatinine 0.44 - 1.00 mg/dL  7.65(H) 10.59(H) 19.53(H)  Sodium 135 - 145 mmol/L 135 135 129(L)  Potassium 3.5 - 5.1 mmol/L 4.9 5.2(H) 6.1(H)  Chloride 98 - 111 mmol/L 95(L) 94(L) 88(L)  CO2 22 - 32 mmol/L 24 20(L) 17(L)  Calcium 8.9 - 10.3 mg/dL 8.8(L) 7.6(L) 7.0(L)  Total Protein 6.5 - 8.1 g/dL - - -  Total Bilirubin 0.3 - 1.2 mg/dL - - -  Alkaline Phos 38 - 126 U/L - - -  AST 15 - 41 U/L - - -  ALT 0 - 44 U/L - - -    Lab Results  Component Value Date   PTH 135 (H) 02/22/2017   CALCIUM 8.8 (L) 08/28/2018   CAION 1.20 07/03/2013   PHOS 5.7 (H) 08/28/2018       Component Value Date/Time   COLORURINE YELLOW (A) 08/23/2018 1209   APPEARANCEUR CLOUDY (A) 08/23/2018 1209   APPEARANCEUR Hazy 07/23/2013 0741   LABSPEC 1.016 08/23/2018 1209   LABSPEC 1.016 07/23/2013 0741   PHURINE 5.0 08/23/2018 1209   GLUCOSEU NEGATIVE 08/23/2018 1209   GLUCOSEU Negative 07/23/2013 0741   HGBUR SMALL (A)  08/23/2018 1209   BILIRUBINUR NEGATIVE 08/23/2018 1209   BILIRUBINUR Negative 07/23/2013 0741   KETONESUR NEGATIVE 08/23/2018 1209   PROTEINUR 100 (A) 08/23/2018 1209   UROBILINOGEN 0.2 06/30/2013 2213   NITRITE NEGATIVE 08/23/2018 1209   LEUKOCYTESUR SMALL (A) 08/23/2018 1209   LEUKOCYTESUR Negative 07/23/2013 0741      Component Value Date/Time   PHART 7.371 07/02/2013 1808   PCO2ART 42.9 07/02/2013 1808   PO2ART 79.0 (L) 07/02/2013 1808   HCO3 25.4 (H) 07/02/2013 1903   TCO2 24 07/03/2013 1754   O2SAT 95.0 07/02/2013 1903       Component Value Date/Time   IRON 64 08/11/2015 0945   FERRITIN 4,127 (H) 08/28/2018 0524   IRONPCTSAT 23.8 08/11/2015 0945       ASSESSMENT/PLAN:    63 year old female patient with a past medical history significant for end-stage renal disease on hemodialysis Monday, Wednesday, and Friday presented for dialysis in transfer from [place].  She presented on 08/25/18, with her last dialysis being the prior Monday.  She had progressive weakness likely secondary to COVID-19, and was unable to make it to dialysis.  1.  End-stage renal disease.   - HD per MWF schedule.  On midodrine.  Syncope on dialysis 6/22 may have been secondary to UF goal.  (had attempted to optimize volume with covid).   - She will need to dialyze at [facility] 3rd shift if she discharges while still covid positive.  This has been set up for when she is deemed medically acceptable for discharge.    2.  COVID-19 positive. Per primary team.  Oxygen requirement improving per nursing  3.  Anemia of chronic disease.  Likely secondary to end-stage renal disease as well as acute illnesss.  No acute indication for PRBC's.  On aranesp weekly   4.  Type 2 diabetes.  per primary team   5.  Hx Hypertension.  Now with hypotension and on a home regimen of midodrine 5 mg BID on non-HD days and 10 mg BID on dialysis days.  Follow on the 10 mg BID for now.   6. New a fib - cardiology is  evaluating and has started amio   7. AMS - would avoid sedating medications.  From a renal standpoint, I have discontinued gabapentin 600 mg daily and if her mental status improves she could have a max dose  of 300 mg daily

## 2018-08-28 NOTE — Progress Notes (Signed)
Patient's history, hospital course reviewed Note order for echo.  Reason Atrial Fibrillation  Pt had echo in Dec 2019  LVEF normal (see CV section)  PT currently Covid +    With concerns for ID and disease spread, and patient's clinical situation would recommend postponing echocardiogram as it does not appear that it will change treatment.  Please call with questions.  Dorris Carnes, MD

## 2018-08-28 NOTE — Progress Notes (Addendum)
PROGRESS NOTE  Jamie Arias:[id number] DOB: 1955/08/17 DOA: 08/23/2018 PCP: [name], MD  HPI/Recap of past 24 hours: 63 y.o.femalewith medical history significant ofESRD on HDMWF,anemia, chronic diastolic congestive heart failure, CAD status post PCI, type 2 diabetes, hypertension, hyperlipidemia, hypothyroidism, and conditions listed belowpresenting as a transfer from [facility]. COVID-19positive. Patient transferred to [facility] for management of COVID-19 and hemodialysis.Patient states her last dialysis was on Monday, June 15 and since then she has not been able to go because she is not feeling well. Reports having generalized weakness. Reports having intermittent tremors for the past few months. Denies family history of tremors. Denies any fevers, chills, chest pain, shortness of breath, cough, nausea, vomiting, abdominal pain, diarrhea, dysuria, or urinary frequency/urgency. No additional history could be obtained from her.  08/28/18: Patient seen and examined at bedside.  She is somnolent but easily arousable to voices.  She has no new concerns.  Vital signs stable.  Maintaining map greater than 65.  O2 sat at 100% on 2 L.  Later this morning RN reports change in mental status.  Patient is alert and oriented x 3. No focal motor deficits reported, clear speech. Patient states she feels depressed and has neck pain. Recent CT head unremarkable for any intracranial findings.  No bony structural abnormalities. Psych consulted for her depression.    Assessment/Plan: Principal Problem:   COVID-19 virus infection Active Problems:   HTN (hypertension)   ESRD (end stage renal disease) (HCC)   Acute on chronic respiratory failure with hypoxia (HCC)   UTI (urinary tract infection)  Acute COVID-19 related pneumonia Presented with a fever Afebrile Chest x-ray and CT chest done on 08/24/2018 showed bilateral patchy infiltrates worse on the left lower lobe,  independently reviewed O2 saturation improving but not all back to her baseline. Maintain O2 saturation greater than 92% Continue inhaler and IV steroids Continue supportive treatment  Newly diagnosed atrial fibrillation Chads Vascor 3 Rate controlled, not on beta-blocker or calcium channel blocker. TSH low 0.194 Free T4 1.05 Reduce dose of levothyroxine to 50 mcg daily from 75 mcg daily Hold 2D echo per cardiology Cardiology consulted She follows with cardiology outpatient Dr. Jacqulyn Cane from [place].  Depression Not on any specific antidepressant Psych consulted  Headache/neck pain Last CT head 08/23/2018 showed no bony abnormalities from the base of the skull No acute intracranial findings Treat symptomatically  Hypothyroidism TSH is low Free T4 upper end of normal Reduce dose of levothyroxine  Acute hypoxic respiratory failure likely secondary to acute COVID-19 related pneumonia Presented with hypoxia with O2 saturation 85% on room air Improving Continue management as stated above Continue to maintain O2 saturation greater than 92%  Resolved hypotension in the setting of ESRD on dialysis Continue midodrine Maintain map greater than 65  ESRD on HD Monday Wednesday Friday Nephrology consulted and following Last hemodialysis 08/27/2018 Continue to closely monitor blood pressure  Intractable nausea with no vomiting IV Zofran PRN 3 times daily  Chronic lower back pain P.o. oxycodone for severe pain.  Added bowel regimen to avoid constipation Tylenol for mild to moderate pain  Anemia of chronic disease likely secondary to ESRD Hemoglobin stable at 9 No sign of active bleeding  Type 2 diabetes with hyperglycemia Last hemoglobin A1c 7.1 on 08/16/17 Continue insulin sliding scale Obtain hemoglobin A1c  Physical deconditioning -PT/OT consulted with recommendation for CIR, have placed order  DVT prophylaxis: Heparin subq 3 times daily  Code Status: Full Family Communication:  Will call if okay with the patient. Disposition Plan:  Possible discharge to CIR when nephrology signs off and repeat COVID-19 negative per CIR policy.  Consultants:   Nephrology  Psychiatry  Procedures:     Antimicrobials:             Anti-infectives (From admission, onward)       Start        Dose/Rate  Route  Frequency  Ordered  Stop     08/25/18 1000    cefTRIAXone (ROCEPHIN) 1 g in sodium chloride 0.9 % 100 mL IVPB       1 g  200 mL/hr over 30 Minutes  Intravenous  Every 24 hours  08/24/18 0022        08/24/18 0500    azithromycin (ZITHROMAX) 500 mg in sodium chloride 0.9 % 250 mL IVPB  Status:  Discontinued       500 mg  250 mL/hr over 60 Minutes  Intravenous  Every 24 hours  08/24/18 0453  08/24/18 0454          Objective: Vitals:   08/28/18 0800 08/28/18 1157 08/28/18 1158 08/28/18 1200  BP: (!) 119/58   (!) 112/44  Pulse:      Resp:      Temp: 99.1 F (37.3 C)   98.9 F (37.2 C)  TempSrc: Axillary   Axillary  SpO2: 94% 100% 100%   Weight:      Height:        Intake/Output Summary (Last 24 hours) at 08/28/2018 1303 Last data filed at 08/27/2018 1720 Gross per 24 hour  Intake 480 ml  Output 1000 ml  Net -520 ml   Filed Weights   08/25/18 1200 08/27/18 1230 08/27/18 1616  Weight: 98 kg 95 kg 94 kg    Exam:  . General: 63 y.o. year-old female well-developed well-nourished in no acute distress.  Somnolent but easily arousable to voices. . Cardiovascular: Regular rate and rhythm with no rubs or gallops.  No JVD or thyromegaly . Respiratory: Clear to Auscultation with No Wheezes or Rales.  Poor Inspiratory Effort. . Abdomen: Obese nontender normal bowel sounds x4. . Musculoskeletal: Trace lower extremity edema.  2 out of 4 pulses in all 4. . Psychiatry: Unable to assess mood due to somnolence.   Data Reviewed: CBC: Recent Labs  Lab 08/23/18 1325 08/25/18 0841  08/28/18 0548  WBC 5.0 6.3 10.6*  NEUTROABS 4.1  --   --   HGB 9.5* 9.2* 9.9*  HCT 29.3* 27.6* 30.2*  MCV 99.0 95.8 97.4  PLT 121* 147* 081   Basic Metabolic Panel: Recent Labs  Lab 08/23/18 0912 08/25/18 0841 08/26/18 0944 08/28/18 0524  NA 132* 129* 135 135  K 5.1 6.1* 5.2* 4.9  CL 88* 88* 94* 95*  CO2 24 17* 20* 24  GLUCOSE 153* 474* 327* 208*  BUN 77* 117* 57* 41*  CREATININE 15.71* 19.53* 10.59* 7.65*  CALCIUM 7.2* 7.0* 7.6* 8.8*  MG  --   --   --  2.2  PHOS  --  10.0*  --  5.7*   GFR: Estimated Creatinine Clearance: 8.3 mL/min (A) (by C-G formula based on SCr of 7.65 mg/dL (H)). Liver Function Tests: Recent Labs  Lab 08/23/18 0912 08/25/18 0841 08/28/18 0524  AST 28  --   --   ALT 16  --   --   ALKPHOS 45  --   --   BILITOT 0.9  --   --   PROT 6.8  --   --  ALBUMIN 3.3* 2.8* 2.8*   No results for input(s): LIPASE, AMYLASE in the last 168 hours. Recent Labs  Lab 08/23/18 1103  AMMONIA 20   Coagulation Profile: No results for input(s): INR, PROTIME in the last 168 hours. Cardiac Enzymes: Recent Labs  Lab 08/23/18 0912 08/23/18 1325 08/24/18 0134  TROPONINI 0.03* 0.03* 0.03*   BNP (last 3 results) No results for input(s): PROBNP in the last 8760 hours. HbA1C: No results for input(s): HGBA1C in the last 72 hours. CBG: Recent Labs  Lab 08/26/18 2055 08/27/18 0818 08/27/18 1713 08/27/18 2034 08/28/18 0824  GLUCAP 341* 296* 157* 136* 276*   Lipid Profile: No results for input(s): CHOL, HDL, LDLCALC, TRIG, CHOLHDL, LDLDIRECT in the last 72 hours. Thyroid Function Tests: Recent Labs    08/28/18 0548  TSH 0.194*  FREET4 1.05   Anemia Panel: Recent Labs    08/27/18 0558 08/28/18 0524  FERRITIN 3,352* 4,127*   Urine analysis:    Component Value Date/Time   COLORURINE YELLOW (A) 08/23/2018 1209   APPEARANCEUR CLOUDY (A) 08/23/2018 1209   APPEARANCEUR Hazy 07/23/2013 0741   LABSPEC 1.016 08/23/2018 1209   LABSPEC 1.016 07/23/2013  0741   PHURINE 5.0 08/23/2018 1209   GLUCOSEU NEGATIVE 08/23/2018 1209   GLUCOSEU Negative 07/23/2013 0741   HGBUR SMALL (A) 08/23/2018 1209   BILIRUBINUR NEGATIVE 08/23/2018 1209   BILIRUBINUR Negative 07/23/2013 0741   KETONESUR NEGATIVE 08/23/2018 1209   PROTEINUR 100 (A) 08/23/2018 1209   UROBILINOGEN 0.2 06/30/2013 2213   NITRITE NEGATIVE 08/23/2018 1209   LEUKOCYTESUR SMALL (A) 08/23/2018 1209   LEUKOCYTESUR Negative 07/23/2013 0741   Sepsis Labs: @LABRCNTIP (procalcitonin:4,lacticidven:4)  ) Recent Results (from the past 240 hour(s))  SARS Coronavirus 2 (CEPHEID - Performed in [place] hospital lab), Hosp Order     Status: Abnormal   Collection Time: 08/23/18  9:12 AM   Specimen: Nasopharyngeal Swab  Result Value Ref Range Status   SARS Coronavirus 2 POSITIVE (A) NEGATIVE Final    Comment: RESULT CALLED TO, READ BACK BY AND VERIFIED WITH: KIM GAULT AT 1117 08/23/2018.PMF (NOTE) If result is NEGATIVE SARS-CoV-2 target nucleic acids are NOT DETECTED. The SARS-CoV-2 RNA is generally detectable in upper and lower  respiratory specimens during the acute phase of infection. The lowest  concentration of SARS-CoV-2 viral copies this assay can detect is 250  copies / mL. A negative result does not preclude SARS-CoV-2 infection  and should not be used as the sole basis for treatment or other  patient management decisions.  A negative result may occur with  improper specimen collection / handling, submission of specimen other  than nasopharyngeal swab, presence of viral mutation(s) within the  areas targeted by this assay, and inadequate number of viral copies  (<250 copies / mL). A negative result must be combined with clinical  observations, patient history, and epidemiological information. If result is POSITIVE SARS-CoV-2 target nucleic acids are DETECTED. The S ARS-CoV-2 RNA is generally detectable in upper and lower  respiratory specimens during the acute phase of  infection.  Positive  results are indicative of active infection with SARS-CoV-2.  Clinical  correlation with patient history and other diagnostic information is  necessary to determine patient infection status.  Positive results do  not rule out bacterial infection or co-infection with other viruses. If result is PRESUMPTIVE POSTIVE SARS-CoV-2 nucleic acids MAY BE PRESENT.   A presumptive positive result was obtained on the submitted specimen  and confirmed on repeat testing.  While 2019 novel  coronavirus  (SARS-CoV-2) nucleic acids may be present in the submitted sample  additional confirmatory testing may be necessary for epidemiological  and / or clinical management purposes  to differentiate between  SARS-CoV-2 and other Sarbecovirus currently known to infect humans.  If clinically indicated additional testing with an alternate test  methodology [phone]) is adv ised. The SARS-CoV-2 RNA is generally  detectable in upper and lower respiratory specimens during the acute  phase of infection. The expected result is Negative. Fact Sheet for Patients:  StrictlyIdeas.no Fact Sheet for Healthcare Providers: BankingDealers.co.za This test is not yet approved or cleared by the [place] FDA and has been authorized for detection and/or diagnosis of SARS-CoV-2 by FDA under an Emergency Use Authorization (EUA).  This EUA will remain in effect (meaning this test can be used) for the duration of the COVID-19 declaration under Section 564(b)(1) of the Act, 21 U.S.C. section 360bbb-3(b)(1), unless the authorization is terminated or revoked sooner. Performed at [facility], [address] [place], [place] [address]   Culture, blood (routine x 2)     Status: None (Preliminary result)   Collection Time: 08/23/18  9:13 AM   Specimen: BLOOD  Result Value Ref Range Status   Specimen Description BLOOD L WRIST  Final   Special Requests    Final    BOTTLES DRAWN AEROBIC AND ANAEROBIC Blood Culture results may not be optimal due to an inadequate volume of blood received in culture bottles   Culture   Final    NO GROWTH 4 DAYS Performed at [facility], [address], [place], [place] [address]    Report Status PENDING  Incomplete  Culture, blood (routine x 2)     Status: None (Preliminary result)   Collection Time: 08/23/18  9:13 AM   Specimen: BLOOD  Result Value Ref Range Status   Specimen Description BLOOD L HAND  Final   Special Requests   Final    BOTTLES DRAWN AEROBIC AND ANAEROBIC Blood Culture results may not be optimal due to an inadequate volume of blood received in culture bottles   Culture   Final    NO GROWTH 4 DAYS Performed at [facility], [address], [place], [place] [address]    Report Status PENDING  Incomplete  Urine culture     Status: None   Collection Time: 08/23/18 12:09 PM   Specimen: Urine, Random  Result Value Ref Range Status   Specimen Description   Final    URINE, RANDOM Performed at [facility], [address] [place], [place] [address]    Special Requests   Final    NONE Performed at [facility], [address], [place], [place] [address]    Culture   Final    NO GROWTH Performed at [facility], [place] [address] [place], [place] [address]    Report Status 08/25/2018 FINAL  Final  MRSA PCR Screening     Status: None   Collection Time: 08/23/18 11:45 PM   Specimen: Nasopharyngeal  Result Value Ref Range Status   MRSA by PCR NEGATIVE NEGATIVE Final    Comment:        The GeneXpert MRSA Assay (FDA approved for NASAL specimens only), is one component of a comprehensive MRSA colonization surveillance program. It is not intended to diagnose MRSA infection nor to guide or monitor treatment for MRSA infections. Performed at [facility], [place] [address], [place],  [address]  Studies: No results found.   Scheduled Meds: . aspirin EC  81 mg Oral Daily  . atorvastatin  80 mg Oral q1800  . calcium acetate  2,001 mg Oral TID WC  . Chlorhexidine Gluconate Cloth  6 each Topical Q0600  . Chlorhexidine Gluconate Cloth  6 each Topical Q0600  . darbepoetin (ARANESP) injection - DIALYSIS  60 mcg Intravenous Q Wed-HD  . feeding supplement (NEPRO CARB STEADY)  237 mL Oral BID BM  . ferrous sulfate  325 mg Oral Q breakfast  . gabapentin  600 mg Oral QHS  . heparin  5,000 Units Subcutaneous Q8H  . insulin aspart  0-5 Units Subcutaneous QHS  . insulin aspart  0-9 Units Subcutaneous TID WC  . insulin aspart  3 Units Subcutaneous TID WC  . insulin glargine  45 Units Subcutaneous Daily  . [START ON 08/29/2018] levothyroxine  50 mcg Oral Q0600  . mouth rinse  15 mL Mouth Rinse BID  . methylPREDNISolone (SOLU-MEDROL) injection  50 mg Intravenous Q12H  . midodrine  10 mg Oral BID PC  . multivitamin  1 tablet Oral QHS  . pantoprazole  40 mg Oral Daily  . polyethylene glycol  17 g Oral Daily  . traZODone  50 mg Oral QHS  . vitamin C  500 mg Oral Daily  . zinc sulfate  220 mg Oral Daily    Continuous Infusions: . sodium chloride 100 mL (08/27/18 0519)  . sodium chloride       LOS: 5 days     Kayleen Memos, MD [facility] Pager [phone]  If 7PM-7AM, please contact night-coverage [email or web address] Password [email or web address] 08/28/2018, 1:03 PM

## 2018-08-28 NOTE — Progress Notes (Signed)
Occupational Therapy Treatment Patient Details Name: Jamie Arias MRN: [id number] DOB: 02/10/1956 Today's Date: 08/28/2018    History of present illness Jamie Arias is a 63 y.o. female with medical history significant of ESRD on HD MWF, anemia, chronic diastolic congestive heart failure, CAD status post PCI, type 2 diabetes, hypertension, hyperlipidemia, hypothyroidism, and conditions listed below presenting as a transfer from [facility].  COVID-19 positive.  Patient transferred to [facility] for management of COVID-19 and hemodialysis.    OT comments  Pt with significant lethargy today; requires max cues to arouse and remain awake and often appearing to have glazed over appearance and blank stare. Pt only intermittently responding to yes/no questions and noted significant delay with following simple commands (approx 5-7 sec delay). RN made aware of current status. Pt on RA upon entry with Spo2 88%, applied 2L O2 initially and bumped up to 3L during activity this session to maintain sats 90-92%, BP noted 144/124 seated EOB. Pt requiring max-totalA +2 for bed mobility; she was able to briefly maintain static balance with minguard assist but requires up to maxA at times given posterior lean, delayed righting reactions. Pt required minA for taking few sips of drink end of session when more awake/alert. Bed left in chair position end of session. Will continue to follow for pt progression.    Follow Up Recommendations  CIR;SNF;Supervision/Assistance - 24 hour(pending pt progress)    Equipment Recommendations  3 in 1 bedside commode;Other (comment)(to be further assessed)          Precautions / Restrictions Precautions Precautions: Fall Precaution Comments: Dyskinesia-like movement, large tremors Restrictions Weight Bearing Restrictions: No       Mobility Bed Mobility Overal bed mobility: Needs Assistance Bed Mobility: Supine to Sit;Sit to Supine     Supine to sit: Max assist;Total  assist;+2 for physical assistance;+2 for safety/equipment Sit to supine: Total assist;+2 for physical assistance;+2 for safety/equipment   General bed mobility comments: pt requires max multimodal cues to follow commands; requires significant assist for LEs over EOB and trunk elevation; totalA+2 to return to supine  Transfers                 General transfer comment: unable due to level of arousal     Balance Overall balance assessment: Needs assistance;History of Falls Sitting-balance support: Single extremity supported;Bilateral upper extremity supported;Feet supported Sitting balance-Leahy Scale: Poor Sitting balance - Comments: pt briefly able to maintain static balance with minguard assist, delayed righting reactions and often with posterior lean; requires up to max-totalA for sitting balance at times Postural control: Posterior lean                                 ADL either performed or assessed with clinical judgement   ADL Overall ADL's : Needs assistance/impaired Eating/Feeding: Supervision/ safety;Minimal assistance;Sitting Eating/Feeding Details (indicate cue type and reason): minA to brink drink to mouth and to drink, noted pt has not eaten her breakfast, pt able to take few sips from drink with supervision to ensure safe swallowing   Grooming Details (indicate cue type and reason): assisted to wash back seated EOB                             Functional mobility during ADLs: Maximal assistance;Total assistance;+2 for physical assistance;+2 for safety/equipment General ADL Comments: limited to bed mobility and sitting EOB today, pt  very lethargic and with increase in cognitive impairments     Vision       Perception     Praxis      Cognition Arousal/Alertness: Lethargic Behavior During Therapy: Flat affect Overall Cognitive Status: Impaired/Different from baseline Area of Impairment: Orientation;Attention;Memory;Following  commands;Awareness                   Current Attention Level: Focused Memory: Decreased short-term memory Following Commands: Follows one step commands inconsistently   Awareness: Intellectual   General Comments: pt very lethargic this session and requires max cues to arouse and remain awake; pt responds "yes" when calling her name, when asked for her full name, DOB, or current location pt does not respond; will occasionally respond to other yes/no questions but very minimally; pt with decreased command following and significant delay when she does follow through with command (approx 5-7 sec delay); at times pt with glazed over look and blank stare, RN made aware        Exercises     Shoulder Instructions       General Comments pt not on O2 upon arrival to room, spO2 88%, applied 2L initially but bumping up to 3L to maintain sats >90% with activity during session. BP monitored and noted max BP 144/124 seated EOB    Pertinent Vitals/ Pain       Pain Assessment: Faces Faces Pain Scale: Hurts little more Pain Location: headache, intermittently Pain Descriptors / Indicators: Headache;Grimacing Pain Intervention(s): Monitored during session;Repositioned  Home Living                                          Prior Functioning/Environment              Frequency  Min 2X/week        Progress Toward Goals  OT Goals(current goals can now be found in the care plan section)  Progress towards OT goals: OT to reassess next treatment  Acute Rehab OT Goals Patient Stated Goal: none stated today OT Goal Formulation: With patient Time For Goal Achievement: 09/08/18 Potential to Achieve Goals: Good  Plan Discharge plan remains appropriate    Co-evaluation    PT/OT/SLP Co-Evaluation/Treatment: Yes Reason for Co-Treatment: Complexity of the patient's impairments (multi-system involvement);For patient/therapist safety;To address functional/ADL transfers    OT goals addressed during session: ADL's and self-care      AM-PAC OT "6 Clicks" Daily Activity     Outcome Measure   Help from another person eating meals?: A Lot Help from another person taking care of personal grooming?: A Lot Help from another person toileting, which includes using toliet, bedpan, or urinal?: Total Help from another person bathing (including washing, rinsing, drying)?: Total Help from another person to put on and taking off regular upper body clothing?: Total Help from another person to put on and taking off regular lower body clothing?: Total 6 Click Score: 8    End of Session Equipment Utilized During Treatment: Oxygen  OT Visit Diagnosis: Muscle weakness (generalized) (M62.81);Other abnormalities of gait and mobility (R26.89)   Activity Tolerance Patient limited by fatigue;Patient limited by lethargy   Patient Left in bed;with call bell/phone within reach;with bed alarm set   Nurse Communication Mobility status;Other (comment)(pt cognitive level/level of arousal)        Time: 1104-1140 OT Time Calculation (min): 36 min  Charges: OT General Charges $OT  Visit: 1 Visit OT Treatments $Self Care/Home Management : 8-22 mins  Lou Cal, OT [facility] Pager [phone] Office [phone]    Raymondo Band 08/28/2018, 12:20 PM

## 2018-08-28 NOTE — Consult Note (Addendum)
Cardiology Consultation:   [name]; [id number]; May 15, 1955   Admit date: 08/23/2018 Date of Consult: 08/28/2018  Primary Care Provider: [name], MD Primary Cardiologist: Dr. Kathlyn Sacramento, MD  Patient Profile:   Jamie Arias is a 63 y.o. female with a hx of CAD s/p CABG, HFpEF, DM2, HTN, HLD, GERD, hypothyroidism, pulmonary hypertension and end stage renal disease on HD (MWF-initiated 12/2015) who is being seen today for the evaluation of new onset atrial fibrillation  at the request of Dr. Nevada Crane.  History of Present Illness:   Jamie Arias is a 56yoF with a hx as stated above who initially presented to [facility] on 08/23/2018 with complaints of increasing fatigue. She was found to be COVID positive and was then transferred to [facility] for further management of acute respiratory failure and hemodialysis. HPI was obtained per chart review and RN input as patient is COVID positive and unable to answer her bedside phone. She reported that she had not been going to HD secondary to feeling poorly, last HD session noted to be 08/18/2018. She reported generalized weakness and intermittent tremors for  several months per chart review. She was doing relatively well during her hospital course until early today, 08/28/2018 when she appeared more somnolent but was easy to arouse on exam. She had no focal motor deficits reported and her speech was clear. Head CT was performed and was unremarkable for intracranial bleed. On telemetry review, she was noted to be in atrial fibrillation with rate control. Labs were obtained which showed a low TSH at 0.194 and free T4 at 1.05. Synthroid was reduced to 5mg and cardiology was consulted for further management.   Jamie Arias noted to have a complex past medical hx including CAD s/p NSTEMI in 06/2013 with subsequent CABGx 3. LVEF was preserved at 65% in 10/2015. She was seen in the OP setting 11/2017 for orthostatic  hypotension for which her Midodrine was increased from PRN to 534mQD. She was then seen by Dr. ArFletcher Anonn12/2019 for 3 month follow up of balance issues. No changes were made to her therapy at that time and plan was made to see her at 6 month follow up.   Past Medical History:  Diagnosis Date  . Anemia   . Chronic diastolic CHF (congestive heart failure) (HCRosen[place]   a. Due to ischemic cardiomyopathy. EF as low as 35%, improved to normal s/p CABG; b. echo 07/06/13: EF 55-60%, no RWMAs, mod TR, trivial pericardial effusion not c/w tamponade physiology;  c. 10/2015 Echo: EF 65%, Gr1 DD, triv AI, mild MR, mildly dil LA, mod TR, PASP 4437m.  . CMarland Kitchenronary artery disease    a. NSTEMI 06/2013; b.cath: severe three-vessel CAD w/ EF 30% & mild-mod MR; c. s/p 3 vessel CABG 07/02/13 (LIMA-LAD, SVG-OM, and SVG-RPDA);  d. 10/2015 MV: no ischemia/infarct.  . Diabetes mellitus without complication (HCC[place] . Diabetic neuropathy ([place]CC[place] . [facility]  . ESRD (end stage renal disease) (HCCAu [place]  a. 12/2015 initiated - mwf dialysis.  . GMarland KitchenRD (gastroesophageal reflux disease)   . Hyperlipidemia   . Hypertension   . Hypothyroidism   . Myocardial infarction (HCCBronson015  . Neuropathy   . Pleural effusion 2015  . Pulmonary hypertension (HCCM[place] . Wears dentures    full lower    Past Surgical History:  Procedure Laterality Date  . A/V FISTULAGRAM Right 12/17/2016  Procedure: A/V Fistulagram;  Surgeon: Algernon Huxley, MD;  Location: [place] CV LAB;  Service: Cardiovascular;  Laterality: Right;  . A/V FISTULAGRAM Right 01/07/2017   Procedure: A/V Fistulagram;  Surgeon: Algernon Huxley, MD;  Location: [place] CV LAB;  Service: Cardiovascular;  Laterality: Right;  . A/V FISTULAGRAM Right 12/03/2017   Procedure: A/V FISTULAGRAM;  Surgeon: Katha Cabal, MD;  Location: [place] CV LAB;  Service: Cardiovascular;  Laterality: Right;  . A/V SHUNT INTERVENTION N/A 02/22/2017   Procedure: A/V SHUNT  INTERVENTION;  Surgeon: Algernon Huxley, MD;  Location: [place] [facility];  Service: Cardiovascular;  Laterality: N/A;  . AV FISTULA PLACEMENT Right 02/03/2016   Procedure: INSERTION OF ARTERIOVENOUS (AV) GORE-TEX GRAFT ARM ( BRACH / AXILLARY );  Surgeon: Katha Cabal, MD;  Location: [facility] ORS;  Service: Vascular;  Laterality: Right;  . CARDIAC CATHETERIZATION    . CATARACT EXTRACTION Bilateral   . CHOLECYSTECTOMY N/A 12/09/2014   Procedure: LAPAROSCOPIC CHOLECYSTECTOMY;  Surgeon: Marlyce [name], MD;  Location: [facility] ORS;  Service: General;  Laterality: N/A;  . CORONARY ARTERY BYPASS GRAFT N/A 07/02/2013   Procedure: CORONARY ARTERY BYPASS GRAFTING (CABG);  Surgeon: Ivin [name], MD;  Location: [place];  Service: Open Heart Surgery;  Laterality: N/A;  CABG x three, using left internal mammary artery and right leg greater saphenous vein harvested endoscopically  . ESOPHAGOGASTRODUODENOSCOPY (EGD) WITH PROPOFOL N/A 11/24/2015   Procedure: ESOPHAGOGASTRODUODENOSCOPY (EGD) WITH PROPOFOL;  Surgeon: [name], MD;  Location: [place];  Service: Endoscopy;  Laterality: N/A;  Diabetic - insulin  . EYE SURGERY Bilateral    Cataract Extraction with IOL  . INTRAOPERATIVE TRANSESOPHAGEAL ECHOCARDIOGRAM N/A 07/02/2013   Procedure: INTRAOPERATIVE TRANSESOPHAGEAL ECHOCARDIOGRAM;  Surgeon: Ivin [name], MD;  Location: [place];  Service: Open Heart Surgery;  Laterality: N/A;  . PERIPHERAL VASCULAR CATHETERIZATION Right 12/06/2015   Procedure: Dialysis/Perma Catheter Insertion;  Surgeon: Katha Cabal, MD;  Location: [place] CV LAB;  Service: Cardiovascular;  Laterality: Right;  . PERIPHERAL VASCULAR THROMBECTOMY Right 09/28/2016   Procedure: Peripheral Vascular Thrombectomy;  Surgeon: Katha Cabal, MD;  Location: [place] CV LAB;  Service: Cardiovascular;  Laterality: Right;  . PERIPHERAL VASCULAR THROMBECTOMY Right 05/20/2018   Procedure: PERIPHERAL VASCULAR THROMBECTOMY;   Surgeon: Katha Cabal, MD;  Location: [place] CV LAB;  Service: Cardiovascular;  Laterality: Right;  . PORTA CATH REMOVAL N/A 06/01/2016   Procedure: Glori [name] Cath Removal;  Surgeon: Katha Cabal, MD;  Location: [place] CV LAB;  Service: Cardiovascular;  Laterality: N/A;  . THORACENTESIS Left 2015     Prior to Admission medications   Medication Sig Start Date End Date Taking? Authorizing Provider  acetaminophen (TYLENOL) 325 MG tablet Take 650 mg by mouth daily as needed for moderate pain or headache.    Yes [provider]  Alirocumab (PRALUENT) 75 MG/ML SOAJ Inject 75 mg into the skin every 14 (fourteen) days. 04/04/18  Yes Wellington Hampshire, MD  aspirin EC 81 MG tablet Take 81 mg by mouth daily.   Yes [provider]  atorvastatin (LIPITOR) 80 MG tablet Take 1 tablet (80 mg total) by mouth daily at 6 PM. 07/11/18  Yes Wellington Hampshire, MD  Calcium Acetate 668 (169 Ca) MG TABS Take 2,004 mg by mouth 3 (three) times daily with meals.  09/04/17  Yes [provider]  ergocalciferol (VITAMIN D2) 50000 units capsule Take 50,000 Units by mouth once a week.  Yes [provider]  ferrous sulfate 325 (65 FE) MG tablet Take 325 mg by mouth daily with breakfast.   Yes [provider]  fluticasone (FLONASE) 50 MCG/ACT nasal spray Place 2 sprays into both nostrils daily. 09/17/17  Yes [name], MD  furosemide (LASIX) 20 MG tablet Take 40 mg by mouth every other day. Non-dialysis days    Yes [provider]  gabapentin (NEURONTIN) 300 MG capsule TAKE 1 CAPSULE BY MOUTH THREE TIMES DAILY Patient taking differently: Take 600 mg by mouth at bedtime.  07/11/18  Yes [name], MD  Insulin Glargine (BASAGLAR KWIKPEN) 100 UNIT/ML SOPN Inject 0.4 mLs (40 Units total) into the skin daily. 08/14/18  Yes [name], MD  levothyroxine (SYNTHROID) 75 MCG tablet TAKE 1 TABLET BY MOUTH ONCE DAILY OFFICE  VISIT  NEEDED  FOR   FURTHER  REFILLS 06/21/18  Yes [name], MD  lidocaine-prilocaine (EMLA) cream Apply 1 application topically as needed (port access).   Yes [provider]  midodrine (PROAMATINE) 10 MG tablet Take one tablet by mouth twice a day on Monday, Wednesday, Friday 05/06/18  Yes Wellington Hampshire, MD  nitroGLYCERIN (NITROSTAT) 0.4 MG SL tablet DISSOLVE ONE TABLET UNDER THE TONGUE EVERY 5 MINUTES AS NEEDED FOR CHEST PAIN.  DO NOT EXCEED A TOTAL OF 3 DOSES IN 15 MINUTES Patient taking differently: Place 0.4 mg under the tongue every 5 (five) minutes as needed for chest pain. DISSOLVE ONE TABLET UNDER THE TONGUE EVERY 5 MINUTES AS NEEDED FOR CHEST PAIN.  DO NOT EXCEED A TOTAL OF 3 DOSES IN 15 MINUTES 08/18/18  Yes Wellington Hampshire, MD  omeprazole (PRILOSEC) 20 MG capsule Take 20 mg by mouth 2 (two) times daily before a meal.   Yes [provider]  ondansetron (ZOFRAN-ODT) 4 MG disintegrating tablet Take 4 mg by mouth every 8 (eight) hours as needed for nausea or vomiting.  08/23/17  Yes [provider]  traZODone (DESYREL) 50 MG tablet TAKE 1 TABLET BY MOUTH AT BEDTIME 07/21/18  Yes [name], MD  ACCU-CHEK FASTCLIX LANCETS MISC Check blood glucose twice daily, diagnosis code E11.9 12/13/16   [name], MD  blood glucose meter kit and supplies KIT Dispense based on patient and insurance preference. Use up to twice daily. (FOR ICD-10 E11.9) 07/01/15   Rubbie Battiest, RN  Blood Glucose Monitoring Suppl (ACCU-CHEK AVIVA PLUS) w/Device KIT Use as directed to test blood sugar up to three times daily 05/06/17   [name], MD  glucose blood (ACCU-CHEK AVIVA) test strip Use as instructed to test blood sugar up to 3 times daily 05/06/17   [name], MD  Insulin Pen Needle (GLOBAL EASE INJECT PEN NEEDLES) 31G X 5 MM MISC Inject 0.1 mLs (10 Units total) into the skin at bedtime 01/12/16   [name], MD  Lancets (ACCU-CHEK SOFT TOUCH) lancets Use as  instructed 05/06/17   [name], MD  Lancets Misc. (ACCU-CHEK SOFTCLIX LANCET DEV) KIT Use as directed to test blood sugar up to three times daily 05/06/17   [name], MD  midodrine (PROAMATINE) 5 MG tablet Take 1 tablet by mouth Twice a day Tuesday, Thursday, Saturday, Sunday. Patient not taking: Reported on 08/24/2018 04/04/18   Wellington Hampshire, MD    Inpatient Medications: Scheduled Meds: . aspirin EC  81 mg Oral Daily  . atorvastatin  80 mg Oral q1800  . calcium acetate  2,001  mg Oral TID WC  . Chlorhexidine Gluconate Cloth  6 each Topical Q0600  . Chlorhexidine Gluconate Cloth  6 each Topical Q0600  . darbepoetin (ARANESP) injection - DIALYSIS  60 mcg Intravenous Q Wed-HD  . feeding supplement (NEPRO CARB STEADY)  237 mL Oral BID BM  . ferrous sulfate  325 mg Oral Q breakfast  . gabapentin  600 mg Oral QHS  . heparin  5,000 Units Subcutaneous Q8H  . insulin aspart  0-5 Units Subcutaneous QHS  . insulin aspart  0-9 Units Subcutaneous TID WC  . insulin aspart  3 Units Subcutaneous TID WC  . insulin glargine  45 Units Subcutaneous Daily  . [START ON 08/29/2018] levothyroxine  50 mcg Oral Q0600  . mouth rinse  15 mL Mouth Rinse BID  . methylPREDNISolone (SOLU-MEDROL) injection  50 mg Intravenous Q12H  . midodrine  10 mg Oral BID PC  . multivitamin  1 tablet Oral QHS  . pantoprazole  40 mg Oral Daily  . polyethylene glycol  17 g Oral Daily  . traZODone  50 mg Oral QHS  . vitamin C  500 mg Oral Daily  . zinc sulfate  220 mg Oral Daily   Continuous Infusions: . sodium chloride 100 mL (08/27/18 0519)  . sodium chloride     PRN Meds: sodium chloride, sodium chloride, acetaminophen, lidocaine (PF), lidocaine-prilocaine, ondansetron (ZOFRAN) IV, oxyCODONE, pentafluoroprop-tetrafluoroeth  Allergies:    Allergies  Allergen Reactions  . Nsaids Other (See Comments)    Contraindicated due to kidney disease.  Marland Kitchen Doxycycline Other (See Comments)    tremor    Social  History:   Social History   Socioeconomic History  . Marital status: Married    Spouse name: Not on file  . Number of children: Not on file  . Years of education: Not on file  . Highest education level: Not on file  Occupational History  . Occupation: works for [employer]  . Financial resource strain: Not on file  . Food insecurity    Worry: Not on file    Inability: Not on file  . Transportation needs    Medical: Not on file    Non-medical: Not on file  Tobacco Use  . Smoking status: Never Smoker  . Smokeless tobacco: Never Used  Substance and Sexual Activity  . Alcohol use: No    Alcohol/week: 0.0 standard drinks  . Drug use: No  . Sexual activity: Not on file  Lifestyle  . Physical activity    Days per week: Not on file    Minutes per session: Not on file  . Stress: Not on file  Relationships  . Social Herbalist on phone: Not on file    Gets together: Not on file    Attends religious service: Not on file    Active member of club or organization: Not on file    Attends meetings of clubs or organizations: Not on file    Relationship status: Not on file  . Intimate partner violence    Fear of current or ex partner: Not on file    Emotionally abused: Not on file    Physically abused: Not on file    Forced sexual activity: Not on file  Other Topics Concern  . Not on file  Social History Narrative   Patient lives at home with her husband. Patient has 4 adult children.    Family History:   Family History  Problem Relation Age  of Onset  . COPD Mother   . Cancer Mother        Lung  . Pulmonary embolism Father   . Diabetes Father   . Diabetes Paternal Grandfather   . Heart disease Maternal Grandmother   . Colon cancer Neg Hx   . Colon polyps Neg Hx   . Esophageal cancer Neg Hx   . Pancreatic cancer Neg Hx   . Liver disease Neg Hx    Family Status:  Family Status  Relation Name Status  . Mother  Deceased  . Father  Deceased   . Sister  Alive  . Daughter  Alive  . Son  Alive  . Daughter  Alive  . Son  Alive  . PGF  (Not Specified)  . MGM  (Not Specified)  . Neg Hx  (Not Specified)    ROS:  Please see the history of present illness.  All other ROS reviewed and negative.     Physical Exam/Data:   Vitals:   08/28/18 1157 08/28/18 1158 08/28/18 1200 08/28/18 1300  BP:   (!) 112/44 (!) 129/56  Pulse:      Resp:    18  Temp:   98.9 F (37.2 C)   TempSrc:   Axillary   SpO2: 100% 100%  98%  Weight:      Height:        Intake/Output Summary (Last 24 hours) at 08/28/2018 1453 Last data filed at 08/27/2018 1720 Gross per 24 hour  Intake 480 ml  Output 1000 ml  Net -520 ml   Filed Weights   08/25/18 1200 08/27/18 1230 08/27/18 1616  Weight: 98 kg 95 kg 94 kg   Body mass index is 35.57 kg/m.   Telephonic exam:  General: NAD Neuro: Somnolent  EKG:  The EKG was personally reviewed and demonstrates: 08/27/2018 Atrial fibrillation with no acute changes noted, HR 99bpm  Telemetry:  Telemetry was personally reviewed and demonstrates: 08/28/2018 atrial fibrillation with HR 80-90's   Relevant CV Studies:  ECHO: 02/13/2018  Study Conclusions  - Left ventricle: The cavity size was normal. Systolic function was   normal. The estimated ejection fraction was in the range of 60%   to 65%. Wall motion was normal; there were no regional wall   motion abnormalities. Doppler parameters are consistent with   abnormal left ventricular relaxation (grade 1 diastolic   dysfunction). - Left atrium: The atrium was mildly dilated. - Right ventricle: Systolic function was normal. - Pulmonary arteries: Systolic pressure was within the normal   range. - Inferior vena cava: The vessel was normal in size. The   respirophasic diameter changes were in the normal range (>= 50%),   consistent with normal central venous pressure.  CATH: See scanned report from 06/29/2013  Laboratory Data:  Chemistry Recent Labs   Lab 08/25/18 0841 08/26/18 0944 08/28/18 0524  NA 129* 135 135  K 6.1* 5.2* 4.9  CL 88* 94* 95*  CO2 17* 20* 24  GLUCOSE 474* 327* 208*  BUN 117* 57* 41*  CREATININE 19.53* 10.59* 7.65*  CALCIUM 7.0* 7.6* 8.8*  GFRNONAA 2* 3* 5*  GFRAA 2* 4* 6*  ANIONGAP 24* 21* 16*    Total Protein  Date Value Ref Range Status  08/23/2018 6.8 6.5 - 8.1 g/dL Final  02/11/2018 6.4 6.0 - 8.5 g/dL Final  07/23/2013 7.0 6.4 - 8.2 g/dL Final   Albumin  Date Value Ref Range Status  08/28/2018 2.8 (L) 3.5 - 5.0 g/dL Final  02/11/2018 4.2 3.6 - 4.8 g/dL Final  07/23/2013 2.7 (L) 3.4 - 5.0 g/dL Final   AST  Date Value Ref Range Status  08/23/2018 28 15 - 41 U/L Final   SGOT(AST)  Date Value Ref Range Status  07/23/2013 16 15 - 37 Unit/L Final   ALT  Date Value Ref Range Status  08/23/2018 16 0 - 44 U/L Final   SGPT (ALT)  Date Value Ref Range Status  07/23/2013 15 12 - 78 U/L Final   Alkaline Phosphatase  Date Value Ref Range Status  08/23/2018 45 38 - 126 U/L Final  07/23/2013 145 (H) Unit/L Final    Comment:    45-117 NOTE: New Reference Range 01/23/13    Total Bilirubin  Date Value Ref Range Status  08/23/2018 0.9 0.3 - 1.2 mg/dL Final   Bilirubin,Total  Date Value Ref Range Status  07/23/2013 0.7 0.2 - 1.0 mg/dL Final   Bilirubin Total  Date Value Ref Range Status  02/11/2018 0.3 0.0 - 1.2 mg/dL Final   Hematology Recent Labs  Lab 08/23/18 1325 08/25/18 0841 08/28/18 0548  WBC 5.0 6.3 10.6*  RBC 2.96* 2.88* 3.10*  HGB 9.5* 9.2* 9.9*  HCT 29.3* 27.6* 30.2*  MCV 99.0 95.8 97.4  MCH 32.1 31.9 31.9  MCHC 32.4 33.3 32.8  RDW 14.5 14.1 14.4  PLT 121* 147* 234   Cardiac Enzymes Recent Labs  Lab 08/23/18 0912 08/23/18 1325 08/24/18 0134  TROPONINI 0.03* 0.03* 0.03*   No results for input(s): TROPIPOC in the last 168 hours.  BNPNo results for input(s): BNP, PROBNP in the last 168 hours.  DDimer  Recent Labs  Lab 08/24/18 0134  DDIMER 1.26*   TSH:   Lab Results  Component Value Date   TSH 0.194 (L) 08/28/2018   Lipids: Lab Results  Component Value Date   CHOL 153 02/11/2018   HDL 43 02/11/2018   LDLCALC 51 02/11/2018   LDLDIRECT 60 02/11/2018   TRIG 294 (H) 02/11/2018   CHOLHDL 3.6 02/11/2018   HgbA1c: Lab Results  Component Value Date   HGBA1C 7.1 08/16/2017   Radiology/Studies:  No results found.  Assessment and Plan:   1. New onset atrial fibrillation: -Pt presented from [facility] on [facility]/2018 with complaints of increasing fatigue and acute hypoxia. She was found to be COVID positive and was then transferred to [facility] for further management of acute respiratory failure and need for hemodialysis. Stable with care until yesterday afternoon when she was found to be in new onset atrial fibrillation  -EKG from 08/28/2018 with atrial fibrillation with rate control at 99bpm -Telemetry with AF with rates in the 80-90 range  -Atrial fibrillation likely in the setting of acute illness and will likely resolve with resolution of underlying causes -Echocardiogram request this admission denied given COVID19  -Last echocardiogram 02/2018 with normal LVEF at 65%. RA normal in size. There is G1DD and NWMA -Troponin, 0.03, 0.03, 0.03 -CHA2DS2VASc =5 ( female, CHF, HTN, vascular disease, DM) -Currently HRs are controlled.  -Will start PO amiodarone given hx of hypotension  -Will start '400mg'$  PO BID for now and monitor response -No anticoagulation for now, if she does not convert on her own, may consider at a later time   2. Acute hypoxic respiratory failure secondary to COVID+ : -Chest CT with patchy opacities  -Continue supplemental oxygen therapy  -Managed per primary team  -On Solumedrol therapy   3. CAD s/p CABG: -Had negative stress test in 2017 -Managed with ASA,  statin, Zetia -Discussion for Praluent given no response to Zetia therapy   4. HLD: -Last LDL, 91 in 07/2017  -Continue current regimen of statin  and Zetia   5. HFpEF: -Fluid volume managed with HD, nephrology  -Last echocardiogram 02/2018  6. ESRD with hypotension: -CXR on hospital presentation without evidence of fluid volume overload  -Normal HD schedule, MWF -Followed by nephrology  -Creatinine, 7.65 today  -On midodrine   For questions or updates, please contact [place] Please consult [email or web address] for contact info under Cardiology/STEMI.   Signed, Kathyrn Drown NP-C [facility] Pager: [phone] 08/28/2018 2:53 PM  Secondary to COVID-19, and to reduce exposure as well as PPE burn, examination as well as history and physical was done via telephonic device.  Currently agree with management of atrial fibrillation in the setting of COVID-19 positive hypoxic respiratory failure. Since she is currently end-stage renal disease with hypotension, there continues to be mounting evidence that anticoagulation in this population may not prove as beneficial as once thought.  Bleeding risks tend to be high.  At this time, I would hold off on anticoagulation given that this may be a reversible cause for her atrial fibrillation, that is when she begins to improve from COVID-19 hopefully her atrial fibrillation will follow as well.  EF is normal.  She does have coronary disease post CABG.  We will continue to monitor her telemetry and clinical situation.  Candee Furbish, MD\

## 2018-08-29 ENCOUNTER — Telehealth: Payer: Self-pay | Admitting: Cardiovascular Disease

## 2018-08-29 DIAGNOSIS — I480 Paroxysmal atrial fibrillation: Secondary | ICD-10-CM

## 2018-08-29 DIAGNOSIS — J9621 Acute and chronic respiratory failure with hypoxia: Secondary | ICD-10-CM

## 2018-08-29 LAB — RENAL FUNCTION PANEL
Albumin: 2.6 g/dL — ABNORMAL LOW (ref 3.5–5.0)
Anion gap: 18 — ABNORMAL HIGH (ref 5–15)
BUN: 67 mg/dL — ABNORMAL HIGH (ref 8–23)
CO2: 23 mmol/L (ref 22–32)
Calcium: 9 mg/dL (ref 8.9–10.3)
Chloride: 95 mmol/L — ABNORMAL LOW (ref 98–111)
Creatinine, Ser: 9.72 mg/dL — ABNORMAL HIGH (ref 0.44–1.00)
GFR calc Af Amer: 4 mL/min — ABNORMAL LOW (ref >60)
GFR calc non Af Amer: 4 mL/min — ABNORMAL LOW (ref >60)
Glucose, Bld: 211 mg/dL — ABNORMAL HIGH (ref 70–99)
Phosphorus: 6 mg/dL — ABNORMAL HIGH (ref 2.5–4.6)
Potassium: 5.6 mmol/L — ABNORMAL HIGH (ref 3.5–5.1)
Sodium: 136 mmol/L (ref 135–145)

## 2018-08-29 LAB — GLUCOSE, CAPILLARY
Glucose-Capillary: 262 mg/dL — ABNORMAL HIGH (ref 70–99)
Glucose-Capillary: 164 mg/dL — ABNORMAL HIGH (ref 70–99)
Glucose-Capillary: 137 mg/dL — ABNORMAL HIGH (ref 70–99)
Glucose-Capillary: 132 mg/dL — ABNORMAL HIGH (ref 70–99)

## 2018-08-29 LAB — FERRITIN: Ferritin: 3803 ng/mL — ABNORMAL HIGH (ref 11–307)

## 2018-08-29 LAB — LACTATE DEHYDROGENASE: LDH: 321 U/L — ABNORMAL HIGH (ref 98–192)

## 2018-08-29 LAB — C-REACTIVE PROTEIN: CRP: 11.7 mg/dL — ABNORMAL HIGH (ref <1.0)

## 2018-08-29 MED ORDER — MIRTAZAPINE 15 MG PO TABS
7.5000 mg | ORAL_TABLET | Freq: Every day | ORAL | Status: DC
  Administered 2018-08-29 – 2018-08-31 (×3): 7.5 mg via ORAL
  Filled 2018-08-29 (×3): qty 1

## 2018-08-29 NOTE — Telephone Encounter (Signed)
LMOV to schedule virtual visit in 6 weeks

## 2018-08-29 NOTE — Telephone Encounter (Signed)
-----   Message from Jamie Raymond, NP sent at 08/29/2018 10:06 AM EDT ----- This patient will need a virtual follow up visit with Dr. Fletcher Anon or APP in 6 weeks per Dr. Marlou Porch. She will is COVID19+   Thank you  Kathyrn Drown NP-C [place] Pager: [phone]

## 2018-08-29 NOTE — Progress Notes (Signed)
Patient drowsy and refusing food and beverages. Assistance offered by holding utensils and putting food and beverage near mouth. Attempts unsuccessful, patient shook head "No" in resfual. Will continue to monitor and offer food and beverage with every encounter. Vitals stable at this time.

## 2018-08-29 NOTE — Progress Notes (Signed)
Inpatient Rehabilitation Admissions Coordinator  I continue to follow pt at a distance. Pt not a candidate for CIR yet, I will follow.  Danne Baxter, RN, MSN Rehab Admissions Coordinator [phone] 08/29/2018 7:54 AM '

## 2018-08-29 NOTE — Progress Notes (Addendum)
Results for SHAQUANDA, GRAVES (MRN [id number]) as of 08/29/2018 09:58  Ref. Range 08/28/2018 08:24 08/28/2018 11:54 08/28/2018 16:54 08/28/2018 21:05 08/29/2018 08:07  Glucose-Capillary Latest Ref Range: 70 - 99 mg/dL 276 (H) 266 (H) 165 (H) 190 (H) 262 (H)  Noted that postprandial CBGs tend to be greater than 180 mg/dl.  Recommend increasing Novolog meal coverage to 5 units TID if blood sugars continue to be elevated and while on steroids. The meal coverage is to be given only if patient eats at least 50% of meal.  Harvel Ricks RN BSN CDE Diabetes Coordinator Pager: [phone]  8am-5pm

## 2018-08-29 NOTE — Progress Notes (Signed)
Family was updated via phone. Spouse says family will make her home cooked meals to be dropped off tomorrow and feel that pt would eat more with food from family.

## 2018-08-29 NOTE — Progress Notes (Signed)
PT Cancellation Note  Patient Details Name: Jamie Arias MRN: [id number] DOB: [birth year]   Cancelled Treatment:    Reason Eval/Treat Not Completed: Patient at procedure or test/unavailable(pt receiving HD)   [name] [facility] 08/29/2018, 11:57 AM [place] Pager [phone] Office [phone]

## 2018-08-29 NOTE — Progress Notes (Signed)
Pt visibly sad, frowning. Weak; needed a lot of assistance to sit at edge of bed. Took pills whole in applesauce; no swallowing issues but too sad to take on own. Required feeding; ate graham crackers with medications. Pt states that she has so many pills and began to sob.

## 2018-08-29 NOTE — Consult Note (Signed)
  Attempted to see patient today. Currently being dialysed and significantly sedated and unable to conduct interview. Will have consult team make another attempt when possible.

## 2018-08-29 NOTE — Progress Notes (Signed)
PROGRESS NOTE  Jamie Arias VEL:[id number] DOB: Jan 08, 1956 DOA: 08/23/2018 PCP: [name], MD  HPI/Recap of past 24 hours: 63 y.o.femalewith medical history significant ofESRD on HDMWF,anemia, chronic diastolic congestive heart failure, CAD status post PCI, type 2 diabetes, hypertension, hyperlipidemia, hypothyroidism, and conditions listed belowpresenting as a transfer from [facility]. COVID-19positive. Patient transferred to [facility] for management of COVID-19 and hemodialysis.Patient states her last dialysis was on Monday, June 15 and since then she has not been able to go because she is not feeling well. Reports having generalized weakness. Reports having intermittent tremors for the past few months. Denies family history of tremors. Denies any fevers, chills, chest pain, shortness of breath, cough, nausea, vomiting, abdominal pain, diarrhea, dysuria, or urinary frequency/urgency. No additional history could be obtained from her.  Reports depression. Not on antidepressant. Psych consulted.  08/29/18: Patient seen and examined at bedside.  She has a flat affect.  She is alert and oriented x4.  Denies neck pain or headaches.  Denies shortness of breath or chest pain.  Denies abdominal pain nausea or diarrhea.  She has no new concerns.  Plan for hemodialysis today.   Assessment/Plan: Principal Problem:   COVID-19 virus infection Active Problems:   HTN (hypertension)   ESRD (end stage renal disease) (HCC)   Acute on chronic respiratory failure with hypoxia (HCC)   UTI (urinary tract infection)  Acute COVID-19 related pneumonia Presented with a fever Afebrile Chest x-ray and CT chest done on 08/24/2018 showed bilateral patchy infiltrates worse on the left lower lobe, independently reviewed O2 saturation improving but not all back to her baseline. Maintain O2 saturation greater than 92% Continue inhaler and IV steroids Continue supportive treatment  Newly  diagnosed atrial fibrillation Started on amiodarone by cardiology Chads Vascor 5 Defer to cardiology for decision on anticoagulation Hold 2D echo per cardiology She follows with cardiology outpatient Dr. Jacqulyn Cane from [place].  Depression Normal antidepressant at home Psych consulted today  Resolved headache/neck pain Last CT head 08/23/2018 showed no bony abnormalities from the base of the skull No acute intracranial findings  Hypothyroidism TSH is low Free T4 upper end of normal Reduce dose of levothyroxine  Acute hypoxic respiratory failure likely secondary to acute COVID-19 related pneumonia Presented with hypoxia with O2 saturation 85% on room air Improving Continue management as stated above Continue to maintain O2 saturation greater than 92%  Resolved hypotension in the setting of ESRD on dialysis Continue midodrine Maintain map greater than 65  ESRD on HD Monday Wednesday Friday Nephrology consulted and following Plan for hemodialysis today 08/29/2018 Continue to closely monitor blood pressure  Intractable nausea with no vomiting IV Zofran PRN 3 times daily  Chronic lower back pain P.o. oxycodone for severe pain.  Added bowel regimen to avoid constipation Tylenol for mild to moderate pain  Anemia of chronic disease likely secondary to ESRD Hemoglobin stable at 9 No sign of active bleeding  Type 2 diabetes with hyperglycemia Last hemoglobin A1c 7.1 on 08/16/17 Continue insulin sliding scale Obtain hemoglobin A1c  Physical deconditioning -PT/OT consulted with recommendation for CIR, have placed order  DVT prophylaxis: Heparin subq 3 times daily Code Status: Full Family Communication:  Will call if okay with the patient. Disposition Plan:  Possible discharge to CIR when nephrology signs off and repeat COVID-19 negative per CIR policy.  Consultants:   Nephrology  Psychiatry  Procedures:     Antimicrobials:  Anti-infectives (From admission, onward)       Start        Dose/Rate  Route  Frequency  Ordered  Stop     08/25/18 1000    cefTRIAXone (ROCEPHIN) 1 g in sodium chloride 0.9 % 100 mL IVPB       1 g  200 mL/hr over 30 Minutes  Intravenous  Every 24 hours  08/24/18 0022        08/24/18 0500    azithromycin (ZITHROMAX) 500 mg in sodium chloride 0.9 % 250 mL IVPB  Status:  Discontinued       500 mg  250 mL/hr over 60 Minutes  Intravenous  Every 24 hours  08/24/18 0453  08/24/18 0454          Objective: Vitals:   08/29/18 1005 08/29/18 1030 08/29/18 1045 08/29/18 1100  BP: (!) 90/40 (!) 85/35 (!) 79/35 (!) 80/40  Pulse: 98 (!) 102 96 (!) 102  Resp:      Temp:      TempSrc:      SpO2:      Weight:      Height:       No intake or output data in the 24 hours ending 08/29/18 1218 Filed Weights   08/27/18 1230 08/27/18 1616 08/29/18 0850  Weight: 95 kg 94 kg 94.4 kg    Exam:  . General: 63 y.o. year-old female well-developed well-nourished flat affect.  No acute distress.  Alert and oriented x4. . Cardiovascular: Regular rate and rhythm with no rubs or gallops.  No JVD thyromegaly noted.   Marland Kitchen Respiratory: Clear to auscultation no wheezes or rales.  Poor inspiratory effort.  Abdomen: Obese nontender nondistended with normal bowel sounds. . Musculoskeletal: Trace lower extremity edema.  2 out of 4 pulses in all 4. . Psychiatry: She has a flat affect.   Data Reviewed: CBC: Recent Labs  Lab 08/23/18 1325 08/25/18 0841 08/28/18 0548  WBC 5.0 6.3 10.6*  NEUTROABS 4.1  --   --   HGB 9.5* 9.2* 9.9*  HCT 29.3* 27.6* 30.2*  MCV 99.0 95.8 97.4  PLT 121* 147* 782   Basic Metabolic Panel: Recent Labs  Lab 08/23/18 0912 08/25/18 0841 08/26/18 0944 08/28/18 0524 08/29/18 0448  NA 132* 129* 135 135 136  K 5.1 6.1* 5.2* 4.9 5.6*  CL 88* 88* 94* 95* 95*  CO2 24 17* 20* 24 23  GLUCOSE 153* 474* 327* 208* 211*  BUN 77* 117* 57* 41* 67*   CREATININE 15.71* 19.53* 10.59* 7.65* 9.72*  CALCIUM 7.2* 7.0* 7.6* 8.8* 9.0  MG  --   --   --  2.2  --   PHOS  --  10.0*  --  5.7* 6.0*   GFR: Estimated Creatinine Clearance: 6.5 mL/min (A) (by C-G formula based on SCr of 9.72 mg/dL (H)). Liver Function Tests: Recent Labs  Lab 08/23/18 0912 08/25/18 0841 08/28/18 0524 08/29/18 0448  AST 28  --   --   --   ALT 16  --   --   --   ALKPHOS 45  --   --   --   BILITOT 0.9  --   --   --   PROT 6.8  --   --   --   ALBUMIN 3.3* 2.8* 2.8* 2.6*   No results for input(s): LIPASE, AMYLASE in the last 168 hours. Recent Labs  Lab 08/23/18 1103  AMMONIA 20   Coagulation Profile: No results for input(s): INR,  PROTIME in the last 168 hours. Cardiac Enzymes: Recent Labs  Lab 08/23/18 0912 08/23/18 1325 08/24/18 0134  TROPONINI 0.03* 0.03* 0.03*   BNP (last 3 results) No results for input(s): PROBNP in the last 8760 hours. HbA1C: No results for input(s): HGBA1C in the last 72 hours. CBG: Recent Labs  Lab 08/28/18 1154 08/28/18 1654 08/28/18 2105 08/29/18 0807 08/29/18 1129  GLUCAP 266* 165* 190* 262* 164*   Lipid Profile: No results for input(s): CHOL, HDL, LDLCALC, TRIG, CHOLHDL, LDLDIRECT in the last 72 hours. Thyroid Function Tests: Recent Labs    08/28/18 0548  TSH 0.194*  FREET4 1.05   Anemia Panel: Recent Labs    08/28/18 0524 08/29/18 0448  FERRITIN 4,127* 3,803*   Urine analysis:    Component Value Date/Time   COLORURINE YELLOW (A) 08/23/2018 1209   APPEARANCEUR CLOUDY (A) 08/23/2018 1209   APPEARANCEUR Hazy 07/23/2013 0741   LABSPEC 1.016 08/23/2018 1209   LABSPEC 1.016 07/23/2013 0741   PHURINE 5.0 08/23/2018 1209   GLUCOSEU NEGATIVE 08/23/2018 1209   GLUCOSEU Negative 07/23/2013 0741   HGBUR SMALL (A) 08/23/2018 1209   BILIRUBINUR NEGATIVE 08/23/2018 1209   BILIRUBINUR Negative 07/23/2013 0741   KETONESUR NEGATIVE 08/23/2018 1209   PROTEINUR 100 (A) 08/23/2018 1209   UROBILINOGEN 0.2  06/30/2013 2213   NITRITE NEGATIVE 08/23/2018 1209   LEUKOCYTESUR SMALL (A) 08/23/2018 1209   LEUKOCYTESUR Negative 07/23/2013 0741   Sepsis Labs: @LABRCNTIP (procalcitonin:4,lacticidven:4)  ) Recent Results (from the past 240 hour(s))  SARS Coronavirus 2 (CEPHEID - Performed in [place] hospital lab), Hosp Order     Status: Abnormal   Collection Time: 08/23/18  9:12 AM   Specimen: Nasopharyngeal Swab  Result Value Ref Range Status   SARS Coronavirus 2 POSITIVE (A) NEGATIVE Final    Comment: RESULT CALLED TO, READ BACK BY AND VERIFIED WITH: KIM GAULT AT 1117 08/23/2018.PMF (NOTE) If result is NEGATIVE SARS-CoV-2 target nucleic acids are NOT DETECTED. The SARS-CoV-2 RNA is generally detectable in upper and lower  respiratory specimens during the acute phase of infection. The lowest  concentration of SARS-CoV-2 viral copies this assay can detect is 250  copies / mL. A negative result does not preclude SARS-CoV-2 infection  and should not be used as the sole basis for treatment or other  patient management decisions.  A negative result may occur with  improper specimen collection / handling, submission of specimen other  than nasopharyngeal swab, presence of viral mutation(s) within the  areas targeted by this assay, and inadequate number of viral copies  (<250 copies / mL). A negative result must be combined with clinical  observations, patient history, and epidemiological information. If result is POSITIVE SARS-CoV-2 target nucleic acids are DETECTED. The S ARS-CoV-2 RNA is generally detectable in upper and lower  respiratory specimens during the acute phase of infection.  Positive  results are indicative of active infection with SARS-CoV-2.  Clinical  correlation with patient history and other diagnostic information is  necessary to determine patient infection status.  Positive results do  not rule out bacterial infection or co-infection with other viruses. If result is  PRESUMPTIVE POSTIVE SARS-CoV-2 nucleic acids MAY BE PRESENT.   A presumptive positive result was obtained on the submitted specimen  and confirmed on repeat testing.  While 2019 novel coronavirus  (SARS-CoV-2) nucleic acids may be present in the submitted sample  additional confirmatory testing may be necessary for epidemiological  and / or clinical management purposes  to differentiate between  SARS-CoV-2 and  other Sarbecovirus currently known to infect humans.  If clinically indicated additional testing with an alternate test  methodology [phone]) is adv ised. The SARS-CoV-2 RNA is generally  detectable in upper and lower respiratory specimens during the acute  phase of infection. The expected result is Negative. Fact Sheet for Patients:  StrictlyIdeas.no Fact Sheet for Healthcare Providers: BankingDealers.co.za This test is not yet approved or cleared by the [place] FDA and has been authorized for detection and/or diagnosis of SARS-CoV-2 by FDA under an Emergency Use Authorization (EUA).  This EUA will remain in effect (meaning this test can be used) for the duration of the COVID-19 declaration under Section 564(b)(1) of the Act, 21 U.S.C. section 360bbb-3(b)(1), unless the authorization is terminated or revoked sooner. Performed at [facility], [place] [place], [place] [address]   Culture, blood (routine x 2)     Status: None (Preliminary result)   Collection Time: 08/23/18  9:13 AM   Specimen: BLOOD  Result Value Ref Range Status   Specimen Description BLOOD L WRIST  Final   Special Requests   Final    BOTTLES DRAWN AEROBIC AND ANAEROBIC Blood Culture results may not be optimal due to an inadequate volume of blood received in culture bottles   Culture   Final    NO GROWTH 4 DAYS Performed at [facility], [address], [place], [place] [address]    Report Status PENDING  Incomplete   Culture, blood (routine x 2)     Status: None (Preliminary result)   Collection Time: 08/23/18  9:13 AM   Specimen: BLOOD  Result Value Ref Range Status   Specimen Description BLOOD L HAND  Final   Special Requests   Final    BOTTLES DRAWN AEROBIC AND ANAEROBIC Blood Culture results may not be optimal due to an inadequate volume of blood received in culture bottles   Culture   Final    NO GROWTH 4 DAYS Performed at [facility], [address], [place], [place] [address]    Report Status PENDING  Incomplete  Urine culture     Status: None   Collection Time: 08/23/18 12:09 PM   Specimen: Urine, Random  Result Value Ref Range Status   Specimen Description   Final    URINE, RANDOM Performed at [facility], [address], [place], [place] [address]    Special Requests   Final    NONE Performed at [facility], [address] [place], [place] [address]    Culture   Final    NO GROWTH Performed at [facility], [place] [address] [place], [place] [address]    Report Status 08/25/2018 FINAL  Final  MRSA PCR Screening     Status: None   Collection Time: 08/23/18 11:45 PM   Specimen: Nasopharyngeal  Result Value Ref Range Status   MRSA by PCR NEGATIVE NEGATIVE Final    Comment:        The GeneXpert MRSA Assay (FDA approved for NASAL specimens only), is one component of a comprehensive MRSA colonization surveillance program. It is not intended to diagnose MRSA infection nor to guide or monitor treatment for MRSA infections. Performed at [facility], [place] [address] [place],  [address]       Studies: No results found.  Scheduled Meds: . amiodarone  400 mg Oral BID  . aspirin EC  81 mg Oral Daily  . atorvastatin  80 mg Oral q1800  .  calcium acetate  2,001 mg Oral TID WC  . Chlorhexidine Gluconate Cloth  6 each Topical Q0600  . darbepoetin (ARANESP) injection - DIALYSIS  60 mcg Intravenous Q Wed-HD  . feeding supplement  (NEPRO CARB STEADY)  237 mL Oral BID BM  . ferrous sulfate  325 mg Oral Q breakfast  . heparin  5,000 Units Subcutaneous Q8H  . insulin aspart  0-5 Units Subcutaneous QHS  . insulin aspart  0-9 Units Subcutaneous TID WC  . insulin aspart  3 Units Subcutaneous TID WC  . insulin glargine  45 Units Subcutaneous Daily  . levothyroxine  50 mcg Oral Q0600  . mouth rinse  15 mL Mouth Rinse BID  . methylPREDNISolone (SOLU-MEDROL) injection  50 mg Intravenous Q12H  . midodrine  10 mg Oral BID PC  . multivitamin  1 tablet Oral QHS  . pantoprazole  40 mg Oral Daily  . polyethylene glycol  17 g Oral Daily  . traZODone  50 mg Oral QHS  . vitamin C  500 mg Oral Daily  . zinc sulfate  220 mg Oral Daily    Continuous Infusions: . sodium chloride 100 mL (08/27/18 0519)  . sodium chloride       LOS: 6 days     Kayleen Memos, MD [facility] Pager [phone]  If 7PM-7AM, please contact night-coverage [email or web address] Password [email or web address] 08/29/2018, 12:18 PM

## 2018-08-29 NOTE — Progress Notes (Addendum)
Progress Note  Patient Name: Jamie Arias Date of Encounter: 08/29/2018  Primary Cardiologist: Dr. Kathlyn Sacramento, MD  Subjective   Attempted to call into patient room without answer. Per chart review, pt continues to be withdrawn and somnolent. Continues to be in AF with rate control. K+ and creatinine more elevated today>>needs HD   Inpatient Medications    Scheduled Meds: . amiodarone  400 mg Oral BID  . aspirin EC  81 mg Oral Daily  . atorvastatin  80 mg Oral q1800  . calcium acetate  2,001 mg Oral TID WC  . Chlorhexidine Gluconate Cloth  6 each Topical Q0600  . darbepoetin (ARANESP) injection - DIALYSIS  60 mcg Intravenous Q Wed-HD  . feeding supplement (NEPRO CARB STEADY)  237 mL Oral BID BM  . ferrous sulfate  325 mg Oral Q breakfast  . heparin  5,000 Units Subcutaneous Q8H  . insulin aspart  0-5 Units Subcutaneous QHS  . insulin aspart  0-9 Units Subcutaneous TID WC  . insulin aspart  3 Units Subcutaneous TID WC  . insulin glargine  45 Units Subcutaneous Daily  . levothyroxine  50 mcg Oral Q0600  . mouth rinse  15 mL Mouth Rinse BID  . methylPREDNISolone (SOLU-MEDROL) injection  50 mg Intravenous Q12H  . midodrine  10 mg Oral BID PC  . multivitamin  1 tablet Oral QHS  . pantoprazole  40 mg Oral Daily  . polyethylene glycol  17 g Oral Daily  . traZODone  50 mg Oral QHS  . vitamin C  500 mg Oral Daily  . zinc sulfate  220 mg Oral Daily   Continuous Infusions: . sodium chloride 100 mL (08/27/18 0519)  . sodium chloride     PRN Meds: sodium chloride, sodium chloride, acetaminophen, lidocaine (PF), lidocaine-prilocaine, ondansetron (ZOFRAN) IV, oxyCODONE, pentafluoroprop-tetrafluoroeth   Vital Signs    Vitals:   08/28/18 1200 08/28/18 1300 08/28/18 2106 08/29/18 0420  BP: (!) 112/44 (!) 129/56 (!) 124/42 (!) 126/58  Pulse:   80 74  Resp:  18 20   Temp: 98.9 F (37.2 C)  (!) 97.4 F (36.3 C) 98.6 F (37 C)  TempSrc: Axillary  Oral Oral  SpO2:  98%  90% 98%  Weight:      Height:       No intake or output data in the 24 hours ending 08/29/18 0640 Filed Weights   08/25/18 1200 08/27/18 1230 08/27/18 1616  Weight: 98 kg 95 kg 94 kg    Physical Exam   Exam performed via telephone to reduce COVID 19 exposure  General: Well developed, NAD Psych: Somnolent   Labs    Chemistry Recent Labs  Lab 08/23/18 0912 08/25/18 0841 08/26/18 0944 08/28/18 0524 08/29/18 0448  NA 132* 129* 135 135 136  K 5.1 6.1* 5.2* 4.9 5.6*  CL 88* 88* 94* 95* 95*  CO2 24 17* 20* 24 23  GLUCOSE 153* 474* 327* 208* 211*  BUN 77* 117* 57* 41* 67*  CREATININE 15.71* 19.53* 10.59* 7.65* 9.72*  CALCIUM 7.2* 7.0* 7.6* 8.8* 9.0  PROT 6.8  --   --   --   --   ALBUMIN 3.3* 2.8*  --  2.8* 2.6*  AST 28  --   --   --   --   ALT 16  --   --   --   --   ALKPHOS 45  --   --   --   --   BILITOT  0.9  --   --   --   --   GFRNONAA 2* 2* 3* 5* 4*  GFRAA 2* 2* 4* 6* 4*  ANIONGAP 20* 24* 21* 16* 18*     Hematology Recent Labs  Lab 08/23/18 1325 08/25/18 0841 08/28/18 0548  WBC 5.0 6.3 10.6*  RBC 2.96* 2.88* 3.10*  HGB 9.5* 9.2* 9.9*  HCT 29.3* 27.6* 30.2*  MCV 99.0 95.8 97.4  MCH 32.1 31.9 31.9  MCHC 32.4 33.3 32.8  RDW 14.5 14.1 14.4  PLT 121* 147* 234    Cardiac Enzymes Recent Labs  Lab 08/23/18 0912 08/23/18 1325 08/24/18 0134  TROPONINI 0.03* 0.03* 0.03*   No results for input(s): TROPIPOC in the last 168 hours.   BNPNo results for input(s): BNP, PROBNP in the last 168 hours.   DDimer  Recent Labs  Lab 08/24/18 0134  DDIMER 1.26*     Radiology    No results found.  Telemetry    08/28/2018 Atrial fibrillation with rates in the 90's - Personally Reviewed  ECG    08/27/2018 Atrial fibrillation with no acute changes noted, HR 99bpm- Personally Reviewed  Cardiac Studies   ECHO: 02/13/2018  Study Conclusions  - Left ventricle: The cavity size was normal. Systolic function was normal. The estimated ejection fraction  was in the range of 60% to 65%. Wall motion was normal; there were no regional wall motion abnormalities. Doppler parameters are consistent with abnormal left ventricular relaxation (grade 1 diastolic dysfunction). - Left atrium: The atrium was mildly dilated. - Right ventricle: Systolic function was normal. - Pulmonary arteries: Systolic pressure was within the normal range. - Inferior vena cava: The vessel was normal in size. The respirophasic diameter changes were in the normal range (>= 50%), consistent with normal central venous pressure.  CATH: See scanned report from 06/29/2013  Patient Profile     63 y.o. female with a hx of CAD s/p CABG, HFpEF, DM2, HTN, HLD, GERD, hypothyroidism, pulmonary hypertension and end stage renal disease on HD (MWF-initiated 12/2015) who is being seen today for the evaluation of new onset atrial fibrillation  at the request of Dr. [place] Crane.  Assessment & Plan    1. New onset atrial fibrillation: -COVID-19 positive>>suspect AF culprit -Tele review today with AF and rates in the 90's  -EKG from 08/28/2018 with atrial fibrillation with rate control at 99bpm -Echocardiogram request this admission denied given COVID19  -Last echocardiogram 02/2018 with normal LVEF at 65%. RA normal in size. There is G1DD and NWMA -Troponin, 0.03, 0.03, 0.03 -CHA2DS2VASc =5 ( female, CHF, HTN, vascular disease, DM) -Continue PO amiodarone load 400mg  PO BID>>will continue  -No anticoagulation given eleavted risk for bleeding complications secondary to COVID-19  -Our hope is that her AF is temporary and will convert to NSR once underlying infection clears  2. Acute hypoxic respiratory failure secondary to COVID+ : -Chest CT with patchy opacities  -Continue supplemental oxygen therapy  -Managed per primary team  -On Solumedrol therapy  -CRP continues to rise, 11.7 today   3. CAD s/p CABG: -Had negative stress test in 2017 -Managed with ASA, statin,  Zetia -Discussion for Praluent given no response to Zetia therapy   4. HLD: -Last LDL, 91 in 07/2017  -Continue current regimen of statin and Zetia   5. HFpEF: -Fluid volume managed with HD, nephrology  -Last echocardiogram 02/2018  6. ESRD with hypotension: -CXR on hospital presentation without evidence of fluid volume overload  -Normal HD schedule, MWF -Followed by nephrology  -  Creatinine, 9.72 today  -On midodrine   7. Hyperkalemia: -K+ elevated at 5.6 today -Managed per nephrology/HD  -No arrhthymias per telemetry review    Signed, Kathyrn Drown NP-C [place] Pager: [phone] 08/29/2018, 6:40 AM     For questions or updates, please contact   Please consult [email or web address] for contact info under Cardiology/STEMI.  Patient taken care of via telehealth technology secondary to COVID-19 crisis to minimize exposure and PPE burn  Agree, and review of notes, she is been more somnolent.  Awaiting hemodialysis.  Telemetry review demonstrates well rate controlled atrial fibrillation.  Assessment and plan:  Paroxysmal atrial fibrillation  - Newly discovered during this admission in the setting of COVID-19 infection and hypoxic respiratory failure.  This is the likely culprit although she does have other risk factors such as coronary disease and end-stage renal disease where atrial fibrillation is commonly seen. - At this time, avoiding anticoagulation given relatively low benefit in the setting of end-stage renal disease population, with overall high bleeding risks. -Amiodarone 400 mg p.o. twice daily continuing.  We will go ahead and continue this load for 7 days then 200 mg p.o.twice daily thereafter for 2 weeks we will then 200 mg a day thereafter. -Hopefully she will convert and not require long-term amiodarone.  End-stage renal disease -Hypotension has been an ongoing issue. Midodrine.  Unable to utilize AV nodal blocking agents because of hypotension.  Using  amiodarone.  Continue with current plan.  No further recommendations at this time.  Please let us know if we can be of further assistance.  We will sign off.  Candee Furbish, MD

## 2018-08-29 NOTE — Progress Notes (Signed)
[facility] Progress Note   63 year old female patient with a past medical history significant for end-stage renal disease on hemodialysis Monday, Wednesday, and Friday presented for dialysis in transfer from [place].  She presented on 08/25/18, with her last dialysis being the prior Monday.  She had progressive weakness likely secondary to COVID-19, and was unable to make it to dialysis.  Assessment/Plan: 1. COVID -19related PNA  - isolation HD/ care per primary - on 1 L O2 at present  CRP and ferritin remain high. 2. ESRD -MWF K 5.6 pre HD today BUN and Cr relatively high given minimal intake.  will see if labs improved with HD . Check chemistries in am. 3. Anemia - hgb 9.2 > 9.9  Aranesp 60 6/24 - on po Fe-  4. Secondary hyperparathyroidism - P 10 Ca 7.6 on ca acetate - will see if P improves 5. HTN/volume - net UF 500 mg today midodrine for BP support bid ; off amlodipine  6. Nutrition - alb 2.8 add renavite/nepro - also on high dose C and Zn sulfate -  7. Possible UTI - s/p empiric ceftriaxone - no culture growth 8. Hypothyroidism - on synthroid TSH 0.94 supressed - dose synthroid reduced. 9. DM - per primary 10. Depression - psych consulted- unable to see today - hopefully tomorrow - consider remeron given anorexia/depression.   11. New afib - on amio 12. Deconditioning - referred for CIR - doesn't seem strong enough at this point especially without eating for rehab  Myriam Jacobson, PA-C [place] [phone] 08/29/2018,4:21 PM  LOS: 6 days   Subjective:   Due to the nature of this patient's suspected COVID-19 with isolation and in keeping with efforts to prevent the spread of infection and to conserve personal protective equipment, a physical exam was not personally performed.  Patient's symptoms and exam were discussed in detail with the RN.  A chart review of other providers notes and the patient's lab work as well as review of other pertinent  studies was performed.  Exam details from prior documentation were reviewed specifically and confirmed with the bedside nurse.  Location of service: [facility]   Discussed with RN.  Tolerated HD today without problems.  Intake has been minimal. Taking only bites. Anorexic.  RN said she is breathing easily on room air. This has been ongoing for some time.  Will need interim TF if intake does not improve.  Objective Vitals:   08/29/18 1130 08/29/18 1150 08/29/18 1300 08/29/18 1554  BP: (!) 88/35 (!) 113/50 (!) 120/45 (!) 121/54  Pulse: 100 (!) 102 90 82  Resp:      Temp:   98.8 F (37.1 C)   TempSrc:   Axillary   SpO2:  98% 93% 95%  Weight:  94 kg    Height:       Physical Exam deferred due to COVID status and to protect PEE.   Additional Objective Labs: Basic Metabolic Panel: Recent Labs  Lab 08/25/18 0841 08/26/18 0944 08/28/18 0524 08/29/18 0448  NA 129* 135 135 136  K 6.1* 5.2* 4.9 5.6*  CL 88* 94* 95* 95*  CO2 17* 20* 24 23  GLUCOSE 474* 327* 208* 211*  BUN 117* 57* 41* 67*  CREATININE 19.53* 10.59* 7.65* 9.72*  CALCIUM 7.0* 7.6* 8.8* 9.0  PHOS 10.0*  --  5.7* 6.0*   Liver Function Tests: Recent Labs  Lab 08/23/18 0912 08/25/18 0841 08/28/18 0524 08/29/18 0448  AST 28  --   --   --  ALT 16  --   --   --   ALKPHOS 45  --   --   --   BILITOT 0.9  --   --   --   PROT 6.8  --   --   --   ALBUMIN 3.3* 2.8* 2.8* 2.6*   No results for input(s): LIPASE, AMYLASE in the last 168 hours. CBC: Recent Labs  Lab 08/23/18 1325 08/25/18 0841 08/28/18 0548  WBC 5.0 6.3 10.6*  NEUTROABS 4.1  --   --   HGB 9.5* 9.2* 9.9*  HCT 29.3* 27.6* 30.2*  MCV 99.0 95.8 97.4  PLT 121* 147* 234   Blood Culture    Component Value Date/Time   SDES  08/23/2018 1209    URINE, RANDOM Performed at [facility], [address] [place] B[place], [place] [address]    [facility]  08/23/2018 1209    NONE Performed at [facility], [address],  [place], [place] [address]    CULT  08/23/2018 1209    NO GROWTH Performed at [facility], [place] [address], [place], [place] [address]    REPTSTATUS 08/25/2018 FINAL 08/23/2018 1209    Cardiac Enzymes: Recent Labs  Lab 08/23/18 0912 08/23/18 1325 08/24/18 0134  TROPONINI 0.03* 0.03* 0.03*   CBG: Recent Labs  Lab 08/28/18 1654 08/28/18 2105 08/29/18 0807 08/29/18 1129 08/29/18 1552  GLUCAP 165* 190* 262* 164* 137*   Iron Studies:  Recent Labs    08/29/18 0448  FERRITIN 3,803*   Lab Results  Component Value Date   INR 0.99 01/30/2016   INR 1.19 07/13/2013   INR 1.27 07/12/2013   Studies/Results: No results found. Medications: . sodium chloride 100 mL (08/27/18 0519)  . sodium chloride     . amiodarone  400 mg Oral BID  . aspirin EC  81 mg Oral Daily  . atorvastatin  80 mg Oral q1800  . calcium acetate  2,001 mg Oral TID WC  . Chlorhexidine Gluconate Cloth  6 each Topical Q0600  . darbepoetin (ARANESP) injection - DIALYSIS  60 mcg Intravenous Q Wed-HD  . feeding supplement (NEPRO CARB STEADY)  237 mL Oral BID BM  . ferrous sulfate  325 mg Oral Q breakfast  . heparin  5,000 Units Subcutaneous Q8H  . insulin aspart  0-5 Units Subcutaneous QHS  . insulin aspart  0-9 Units Subcutaneous TID WC  . insulin aspart  3 Units Subcutaneous TID WC  . insulin glargine  45 Units Subcutaneous Daily  . levothyroxine  50 mcg Oral Q0600  . mouth rinse  15 mL Mouth Rinse BID  . methylPREDNISolone (SOLU-MEDROL) injection  50 mg Intravenous Q12H  . midodrine  10 mg Oral BID PC  . multivitamin  1 tablet Oral QHS  . pantoprazole  40 mg Oral Daily  . polyethylene glycol  17 g Oral Daily  . traZODone  50 mg Oral QHS  . vitamin C  500 mg Oral Daily  . zinc sulfate  220 mg Oral Daily

## 2018-08-29 NOTE — Progress Notes (Signed)
Patient remain drowsy and fatigue throughout shift.  Attempts to stimulate patient through conversation, light, food, and television unsuccessful.  She verbalized no interest in the activities and desired to sleep.  Trazdone held to due to her solemence.  Patient vitals stable, no respiratory distress. Will continue to monitor.

## 2018-08-30 LAB — RENAL FUNCTION PANEL
Albumin: 2.5 g/dL — ABNORMAL LOW (ref 3.5–5.0)
Anion gap: 17 — ABNORMAL HIGH (ref 5–15)
BUN: 50 mg/dL — ABNORMAL HIGH (ref 8–23)
CO2: 25 mmol/L (ref 22–32)
Calcium: 8.8 mg/dL — ABNORMAL LOW (ref 8.9–10.3)
Chloride: 94 mmol/L — ABNORMAL LOW (ref 98–111)
Creatinine, Ser: 7.26 mg/dL — ABNORMAL HIGH (ref 0.44–1.00)
GFR calc Af Amer: 6 mL/min — ABNORMAL LOW (ref >60)
GFR calc non Af Amer: 5 mL/min — ABNORMAL LOW (ref >60)
Glucose, Bld: 185 mg/dL — ABNORMAL HIGH (ref 70–99)
Phosphorus: 5.6 mg/dL — ABNORMAL HIGH (ref 2.5–4.6)
Potassium: 4.9 mmol/L (ref 3.5–5.1)
Sodium: 136 mmol/L (ref 135–145)

## 2018-08-30 LAB — GLUCOSE, CAPILLARY
Glucose-Capillary: 181 mg/dL — ABNORMAL HIGH (ref 70–99)
Glucose-Capillary: 194 mg/dL — ABNORMAL HIGH (ref 70–99)
Glucose-Capillary: 206 mg/dL — ABNORMAL HIGH (ref 70–99)
Glucose-Capillary: 221 mg/dL — ABNORMAL HIGH (ref 70–99)
Glucose-Capillary: 181 mg/dL — ABNORMAL HIGH (ref 70–99)

## 2018-08-30 LAB — SARS CORONAVIRUS 2 BY RT PCR (HOSPITAL ORDER, PERFORMED IN ~~LOC~~ HOSPITAL LAB): SARS Coronavirus 2: POSITIVE — AB

## 2018-08-30 LAB — LACTATE DEHYDROGENASE: LDH: 255 U/L — ABNORMAL HIGH (ref 98–192)

## 2018-08-30 NOTE — Progress Notes (Signed)
PROGRESS NOTE  MANA HABERL AXE:[id number] DOB: 1955-11-12 DOA: 08/23/2018 PCP: [name], MD  HPI/Recap of past 24 hours: 63 y.o.femalewith medical history significant ofESRD on HDMWF,anemia, chronic diastolic congestive heart failure, CAD status post PCI, type 2 diabetes, hypertension, hyperlipidemia, hypothyroidism, and conditions listed belowpresenting as a transfer [facility]. COVID-19positive. Patient transferred to [facility] for management of COVID-19 and hemodialysis.Patient states her last dialysis was on Monday, June 15 and since then she has not been able to go because she is not feeling well. Reports having generalized weakness. Reports having intermittent tremors for the past few months. Denies family history of tremors. Denies any fevers, chills, chest pain, shortness of breath, cough, nausea, vomiting, abdominal pain, diarrhea, dysuria, or urinary frequency/urgency. No additional history could be obtained from her.  Reports depression. Not on antidepressant. Psych consulted.  08/30/18: Patient seen and examined at her bedside.  She has no new complaints.  She has a flat affect.  Awaiting psych evaluation.    Assessment/Plan: Principal Problem:   COVID-19 virus infection Active Problems:   HTN (hypertension)   ESRD (end stage renal disease) (HCC)   Acute on chronic respiratory failure with hypoxia (HCC)   UTI (urinary tract infection)  COVID-19 viral pneumonia Presented with a fever Afebrile Chest x-ray and CT chest done on 08/24/2018 showed bilateral patchy infiltrates worse on the left lower lobe, independently reviewed O2 saturation improving but not all back to her baseline. Maintain O2 saturation greater than 92% Continue inhaler and IV steroids Continue supportive treatment 1st covid 19 + on 08/23/18 Repeat covid 19 + in house + on 08/30/18  Newly diagnosed atrial fibrillation Started on amiodarone by cardiology Chads Vascor  5 Defer to cardiology for decision on anticoagulation Hold 2D echo per cardiology She follows with cardiology outpatient Dr. Jacqulyn Cane from [place].  Depression Not on antidepressant at home Awaiting psych evaluation Started on Remeron on 08/29/2018 due to depression and anorexia.  Resolved headache/neck pain Last CT head 08/23/2018 showed no bony abnormalities from the base of the skull No acute intracranial findings  Hypothyroidism TSH is low Free T4 upper end of normal Reduce dose of levothyroxine  Acute hypoxic respiratory failure likely secondary to acute COVID-19 related pneumonia Presented with hypoxia with O2 saturation 85% on room air Improving Continue management as stated above Continue to maintain O2 saturation greater than 92%  Resolved hypotension in the setting of ESRD on dialysis Continue midodrine Maintain map greater than 65  ESRD on HD Monday Wednesday Friday Nephrology consulted and following Plan for hemodialysis today 08/29/2018 Continue to closely monitor blood pressure  Intractable nausea with no vomiting IV Zofran PRN 3 times daily  Chronic lower back pain P.o. oxycodone for severe pain.  Added bowel regimen to avoid constipation Tylenol for mild to moderate pain  Anemia of chronic disease likely secondary to ESRD Hemoglobin stable at 9 No sign of active bleeding  Type 2 diabetes with hyperglycemia Last hemoglobin A1c 7.1 on 08/16/17 Continue insulin sliding scale Obtain hemoglobin A1c  Physical deconditioning -PT/OT consulted with recommendation for CIR, have placed order  DVT prophylaxis: Heparin subq 3 times daily Code Status: Full Family Communication:  Will call if okay with the patient. Disposition Plan:  Possible discharge to CIR when nephrology signs off and repeat COVID-19 negative per CIR policy.  Consultants:   Nephrology  Psychiatry  Procedures:     Antimicrobials:  Anti-infectives (From admission, onward)       Start        Dose/Rate  Route  Frequency  Ordered  Stop     08/25/18 1000    cefTRIAXone (ROCEPHIN) 1 g in sodium chloride 0.9 % 100 mL IVPB       1 g  200 mL/hr over 30 Minutes  Intravenous  Every 24 hours  08/24/18 0022        08/24/18 0500    azithromycin (ZITHROMAX) 500 mg in sodium chloride 0.9 % 250 mL IVPB  Status:  Discontinued       500 mg  250 mL/hr over 60 Minutes  Intravenous  Every 24 hours  08/24/18 0453  08/24/18 0454          Objective: Vitals:   08/29/18 2247 08/30/18 0400 08/30/18 0600 08/30/18 0824  BP:   (!) 133/58 (!) 118/47  Pulse:  79 74 63  Resp:    17  Temp:   (!) 97.5 F (36.4 C) 99.2 F (37.3 C)  TempSrc:   Oral Axillary  SpO2: 93% 95% 97% 98%  Weight:      Height:       No intake or output data in the 24 hours ending 08/30/18 1449 Filed Weights   08/27/18 1616 08/29/18 0850 08/29/18 1150  Weight: 94 kg 94.4 kg 94 kg    Exam:   General: 63 y.o. year-old female well-developed well-nourished no acute distress.  Flat affect.  Alert and oriented x4.   Cardiovascular: Regular rate and rhythm with no rubs or gallops.  No JVD or thyromegaly.    Respiratory: Clear to auscultation no wheezes or rales. Poor inspiratory effort.  Abdomen: Obese nontender nondistended with normal bowel sounds.  Musculoskeletal: Trace lower extremity edema.  2 out of 4 pulses in all 4.  Psychiatry: Flat affect.   Data Reviewed: CBC: Recent Labs  Lab 08/25/18 0841 08/28/18 0548  WBC 6.3 10.6*  HGB 9.2* 9.9*  HCT 27.6* 30.2*  MCV 95.8 97.4  PLT 147* 932   Basic Metabolic Panel: Recent Labs  Lab 08/25/18 0841 08/26/18 0944 08/28/18 0524 08/29/18 0448 08/30/18 0604  NA 129* 135 135 136 136  K 6.1* 5.2* 4.9 5.6* 4.9  CL 88* 94* 95* 95* 94*  CO2 17* 20* 24 23 25   GLUCOSE 474* 327* 208* 211* 185*  BUN 117* 57* 41* 67* 50*  CREATININE 19.53* 10.59* 7.65* 9.72* 7.26*   CALCIUM 7.0* 7.6* 8.8* 9.0 8.8*  MG  --   --  2.2  --   --   PHOS 10.0*  --  5.7* 6.0* 5.6*   GFR: Estimated Creatinine Clearance: 8.7 mL/min (A) (by C-G formula based on SCr of 7.26 mg/dL (H)). Liver Function Tests: Recent Labs  Lab 08/25/18 0841 08/28/18 0524 08/29/18 0448 08/30/18 0604  ALBUMIN 2.8* 2.8* 2.6* 2.5*   No results for input(s): LIPASE, AMYLASE in the last 168 hours. No results for input(s): AMMONIA in the last 168 hours. Coagulation Profile: No results for input(s): INR, PROTIME in the last 168 hours. Cardiac Enzymes: Recent Labs  Lab 08/24/18 0134  TROPONINI 0.03*   BNP (last 3 results) No results for input(s): PROBNP in the last 8760 hours. HbA1C: No results for input(s): HGBA1C in the last 72 hours. CBG: Recent Labs  Lab 08/29/18 1552 08/29/18 2137 08/30/18 0823 08/30/18 1008 08/30/18 1158  GLUCAP 137* 132* 181* 194* 206*   Lipid Profile: No results for input(s): CHOL, HDL, LDLCALC, TRIG, CHOLHDL,  LDLDIRECT in the last 72 hours. Thyroid Function Tests: Recent Labs    08/28/18 0548  TSH 0.194*  FREET4 1.05   Anemia Panel: Recent Labs    08/28/18 0524 08/29/18 0448  FERRITIN 4,127* 3,803*   Urine analysis:    Component Value Date/Time   COLORURINE YELLOW (A) 08/23/2018 1209   APPEARANCEUR CLOUDY (A) 08/23/2018 1209   APPEARANCEUR [place] 07/23/2013 0741   LABSPEC 1.016 08/23/2018 1209   LABSPEC 1.016 07/23/2013 0741   PHURINE 5.0 08/23/2018 1209   GLUCOSEU NEGATIVE 08/23/2018 1209   GLUCOSEU Negative 07/23/2013 0741   HGBUR SMALL (A) 08/23/2018 1209   BILIRUBINUR NEGATIVE 08/23/2018 1209   BILIRUBINUR Negative 07/23/2013 0741   KETONESUR NEGATIVE 08/23/2018 1209   PROTEINUR 100 (A) 08/23/2018 1209   UROBILINOGEN 0.2 06/30/2013 2213   NITRITE NEGATIVE 08/23/2018 1209   LEUKOCYTESUR SMALL (A) 08/23/2018 1209   LEUKOCYTESUR Negative 07/23/2013 0741   Sepsis Labs: @LABRCNTIP (procalcitonin:4,lacticidven:4)  ) Recent Results  (from the past 240 hour(s))  SARS Coronavirus 2 (CEPHEID - Performed in [place] hospital lab), Hosp Order     Status: Abnormal   Collection Time: 08/23/18  9:12 AM   Specimen: Nasopharyngeal Swab  Result Value Ref Range Status   SARS Coronavirus 2 POSITIVE (A) NEGATIVE Final    Comment: RESULT CALLED TO, READ BACK BY AND VERIFIED WITH: KIM GAULT AT 1117 08/23/2018.PMF (NOTE) If result is NEGATIVE SARS-CoV-2 target nucleic acids are NOT DETECTED. The SARS-CoV-2 RNA is generally detectable in upper and lower  respiratory specimens during the acute phase of infection. The lowest  concentration of SARS-CoV-2 viral copies this assay can detect is 250  copies / mL. A negative result does not preclude SARS-CoV-2 infection  and should not be used as the sole basis for treatment or other  patient management decisions.  A negative result may occur with  improper specimen collection / handling, submission of specimen other  than nasopharyngeal swab, presence of viral mutation(s) within the  areas targeted by this assay, and inadequate number of viral copies  (<250 copies / mL). A negative result must be combined with clinical  observations, patient history, and epidemiological information. If result is POSITIVE SARS-CoV-2 target nucleic acids are DETECTED. The S ARS-CoV-2 RNA is generally detectable in upper and lower  respiratory specimens during the acute phase of infection.  Positive  results are indicative of active infection with SARS-CoV-2.  Clinical  correlation with patient history and other diagnostic information is  necessary to determine patient infection status.  Positive results do  not rule out bacterial infection or co-infection with other viruses. If result is PRESUMPTIVE POSTIVE SARS-CoV-2 nucleic acids MAY BE PRESENT.   A presumptive positive result was obtained on the submitted specimen  and confirmed on repeat testing.  While 2019 novel coronavirus  (SARS-CoV-2) nucleic  acids may be present in the submitted sample  additional confirmatory testing may be necessary for epidemiological  and / or clinical management purposes  to differentiate between  SARS-CoV-2 and other Sarbecovirus currently known to infect humans.  If clinically indicated additional testing with an alternate test  methodology [id number]) is adv ised. The SARS-CoV-2 RNA is generally  detectable in upper and lower respiratory specimens during the acute  phase of infection. The expected result is Negative. Fact Sheet for Patients:  StrictlyIdeas.no Fact Sheet for Healthcare Providers: BankingDealers.co.za This test is not yet approved or cleared by the [place] FDA and has been authorized for detection and/or diagnosis of SARS-CoV-2 by FDA  under an Emergency Use Authorization (EUA).  This EUA will remain in effect (meaning this test can be used) for the duration of the COVID-19 declaration under Section 564(b)(1) of the Act, 21 U.S.C. section 360bbb-3(b)(1), unless the authorization is terminated or revoked sooner. Performed at [facility], [address] [place], [place] [address]   Culture, blood (routine x 2)     Status: None (Preliminary result)   Collection Time: 08/23/18  9:13 AM   Specimen: BLOOD  Result Value Ref Range Status   Specimen Description BLOOD L WRIST  Final   Special Requests   Final    BOTTLES DRAWN AEROBIC AND ANAEROBIC Blood Culture results may not be optimal due to an inadequate volume of blood received in culture bottles   Culture   Final    NO GROWTH 4 DAYS Performed at [facility], [address] [place], [place] [address]    Report Status PENDING  Incomplete  Culture, blood (routine x 2)     Status: None (Preliminary result)   Collection Time: 08/23/18  9:13 AM   Specimen: BLOOD  Result Value Ref Range Status   Specimen Description BLOOD L HAND  Final   Special Requests   Final      BOTTLES DRAWN AEROBIC AND ANAEROBIC Blood Culture results may not be optimal due to an inadequate volume of blood received in culture bottles   Culture   Final    NO GROWTH 4 DAYS Performed at [facility], [address] [place], [place] [address]    Report Status PENDING  Incomplete  Urine culture     Status: None   Collection Time: 08/23/18 12:09 PM   Specimen: Urine, Random  Result Value Ref Range Status   Specimen Description   Final    URINE, RANDOM Performed at [facility], [address] [place], [place] [address]    Special Requests   Final    NONE Performed at [facility], [address], [place], [place] [address]    Culture   Final    NO GROWTH Performed at [facility], [place] [address] [place], [place] [address]    Report Status 08/25/2018 FINAL  Final  MRSA PCR Screening     Status: None   Collection Time: 08/23/18 11:45 PM   Specimen: Nasopharyngeal  Result Value Ref Range Status   MRSA by PCR NEGATIVE NEGATIVE Final    Comment:        The GeneXpert MRSA Assay (FDA approved for NASAL specimens only), is one component of a comprehensive MRSA colonization surveillance program. It is not intended to diagnose MRSA infection nor to guide or monitor treatment for MRSA infections. Performed at [facility], [place] [address], [place], [place] [address]   SARS Coronavirus 2 [facility] order, Performed in [place] hospital lab)     Status: Abnormal   Collection Time: 08/30/18  5:51 AM   Specimen: Nasopharyngeal Swab  Result Value Ref Range Status   SARS Coronavirus 2 POSITIVE (A) NEGATIVE Final    Comment: CRITICAL RESULT CALLED TO, READ BACK BY AND VERIFIED WITH: RN Alphia Moh [id number] AT 706 AM BY CM (NOTE) If result is NEGATIVE SARS-CoV-2 target nucleic acids are NOT DETECTED. The SARS-CoV-2 RNA is generally detectable in upper and lower  respiratory specimens during the acute phase of infection. The lowest   concentration of SARS-CoV-2 viral copies this assay can detect is 250  copies / mL. A negative  result does not preclude SARS-CoV-2 infection  and should not be used as the sole basis for treatment or other  patient management decisions.  A negative result may occur with  improper specimen collection / handling, submission of specimen other  than nasopharyngeal swab, presence of viral mutation(s) within the  areas targeted by this assay, and inadequate number of viral copies  (<250 copies / mL). A negative result must be combined with clinical  observations, patient history, and epidemiological information. If result is POSITIVE SARS-CoV-2 target nucleic acids are DE TECTED. The SARS-CoV-2 RNA is generally detectable in upper and lower  respiratory specimens during the acute phase of infection.  Positive  results are indicative of active infection with SARS-CoV-2.  Clinical  correlation with patient history and other diagnostic information is  necessary to determine patient infection status.  Positive results do  not rule out bacterial infection or co-infection with other viruses. If result is PRESUMPTIVE POSTIVE SARS-CoV-2 nucleic acids MAY BE PRESENT.   A presumptive positive result was obtained on the submitted specimen  and confirmed on repeat testing.  While 2019 novel coronavirus  (SARS-CoV-2) nucleic acids may be present in the submitted sample  additional confirmatory testing may be necessary for epidemiological  and / or clinical management purposes  to differentiate between  SARS-CoV-2 and other Sarbecovirus currently known to infect humans.  If clinically indicated additional testing with an alternate test  methodology (L [id number]) is advised. The SARS-CoV-2 RNA is generally  detectable in upper and lower respiratory specimens during the acute  phase of infection. The expected result is Negative. Fact Sheet for Patients:  StrictlyIdeas.no Fact Sheet  for Healthcare Providers: BankingDealers.co.za This test is not yet approved or cleared by the [place] FDA and has been authorized for detection and/or diagnosis of SARS-CoV-2 by FDA under an Emergency Use Authorization (EUA).  This EUA will remain in effect (meaning this test can be used) for the duration of the COVID-19 declaration under Section 564(b)(1) of the Act, 21 U.S.C. section 360bbb-3(b)(1), unless the authorization is terminated or revoked sooner. Performed at [facility], [place] [address], [place], [place] [address]       Studies: No results found.  Scheduled Meds:  amiodarone  400 mg Oral BID   aspirin EC  81 mg Oral Daily   atorvastatin  80 mg Oral q1800   calcium acetate  2,001 mg Oral TID WC   Chlorhexidine Gluconate Cloth  6 each Topical Q0600   darbepoetin (ARANESP) injection - DIALYSIS  60 mcg Intravenous Q Wed-HD   feeding supplement (NEPRO CARB STEADY)  237 mL Oral BID BM   ferrous sulfate  325 mg Oral Q breakfast   heparin  5,000 Units Subcutaneous Q8H   insulin aspart  0-5 Units Subcutaneous QHS   insulin aspart  0-9 Units Subcutaneous TID WC   insulin aspart  3 Units Subcutaneous TID WC   insulin glargine  45 Units Subcutaneous Daily   levothyroxine  50 mcg Oral Q0600   mouth rinse  15 mL Mouth Rinse BID   methylPREDNISolone (SOLU-MEDROL) injection  50 mg Intravenous Q12H   midodrine  10 mg Oral BID PC   mirtazapine  7.5 mg Oral QHS   multivitamin  1 tablet Oral QHS   pantoprazole  40 mg Oral Daily   polyethylene glycol  17 g Oral Daily   vitamin C  500 mg Oral Daily   zinc sulfate  220 mg Oral Daily    Continuous  Infusions:  sodium chloride 100 mL (08/27/18 0519)   sodium chloride       LOS: 7 days     Kayleen Memos, MD [facility] Pager [phone]  If 7PM-7AM, please contact night-coverage [email or web address] Password [facility] 08/30/2018, 2:49 PM

## 2018-08-30 NOTE — Progress Notes (Signed)
[facility] Progress Note   63 year old female patient with a past medical history significant for end-stage renal disease on hemodialysis Monday, Wednesday, and Friday presented for dialysis in transfer from [place].  She presented on 08/25/18, with her last dialysis being the prior Monday.  She had progressive weakness likely secondary to COVID-19, and was unable to make it to dialysis.  Assessment/Plan: 1. COVID -19related PNA  - isolation HD/ care per primary - on  O2 at present  CRP and ferritin remain high. Repeat COVID +postive 6/27  2. ESRD -MWF K 5.6 pre HD Friday  > 4.8 today after 3 hr of HD Friday and poor intake.  BUN/Cr elatively high given minimal intake.  Would have expected better improvement in labs today given dialysis yesterday - being run only 3 hrs may be contributory - will increase to 4 hr next HD VP/AP on Friday within usual range. If K ^ can add lokelma  Sunday. 3. Anemia - hgb 9.2 > 9.9  Aranesp 60 6/24 - on po Fe-  4. Secondary hyperparathyroidism - P 10 Ca 7.6 on ca acetate - will see if P improves 5. HTN/volume - net UF 500 mg Friday midodrine for BP support bid ; off amlodipine  6. Nutrition - alb 2.8 add renavite/nepro - also on high dose C and Zn sulfate - liberalized diet to regular with fluid restriction; family to bring some food from home today. 7. Possible UTI - s/p empiric ceftriaxone - no culture growth 8. Hypothyroidism - on synthroid TSH 0.94 supressed - dose synthroid reduced. 9. DM - per primary 10. Depression - psych consulted- unable to see today - hopefully tomorrow - consider remeron given anorexia/depression; this is likely contributory to poor intake 11. New afib - on amio 12. Deconditioning - referred for CIR - doesn't seem strong enough at this point for rehab especially without eating   Myriam Jacobson, PA-C [place] [phone] 08/30/2018,11:03 AM  LOS: 7 days   Subjective:   Due to the nature of  this patient's suspected COVID-19 with isolation and in keeping with efforts to prevent the spread of infection and to conserve personal protective equipment, a physical exam was not personally performed.  Patient's symptoms and exam were discussed in detail with the RN.  A chart review of other providers notes and the patient's lab work as well as review of other pertinent studies was performed.  Exam details from prior documentation were reviewed specifically and confirmed with the bedside nurse.  Location of service: [facility]   Discussed with RN. On O2 this am.  Scant intake. Family to bring in some home cooked food today. Discussed liberalized diet to facilitate intake.  Very depressed, tearful at times. RN to see if she will get OOB into chair today. RN said patient's breathing ok - no obvious edema.   Objective Vitals:   08/29/18 2247 08/30/18 0400 08/30/18 0600 08/30/18 0824  BP:   (!) 133/58 (!) 118/47  Pulse:  79 74 63  Resp:    17  Temp:   (!) 97.5 F (36.4 C) 99.2 F (37.3 C)  TempSrc:   Oral Axillary  SpO2: 93% 95% 97% 98%  Weight:      Height:       Physical Exam deferred due to COVID status and to protect PEE.   Additional Objective Labs: Basic Metabolic Panel: Recent Labs  Lab 08/28/18 0524 08/29/18 0448 08/30/18 0604  NA 135 136 136  K  4.9 5.6* 4.9  CL 95* 95* 94*  CO2 24 23 25   GLUCOSE 208* 211* 185*  BUN 41* 67* 50*  CREATININE 7.65* 9.72* 7.26*  CALCIUM 8.8* 9.0 8.8*  PHOS 5.7* 6.0* 5.6*   Liver Function Tests: Recent Labs  Lab 08/28/18 0524 08/29/18 0448 08/30/18 0604  ALBUMIN 2.8* 2.6* 2.5*   No results for input(s): LIPASE, AMYLASE in the last 168 hours. CBC: Recent Labs  Lab 08/23/18 1325 08/25/18 0841 08/28/18 0548  WBC 5.0 6.3 10.6*  NEUTROABS 4.1  --   --   HGB 9.5* 9.2* 9.9*  HCT 29.3* 27.6* 30.2*  MCV 99.0 95.8 97.4  PLT 121* 147* 234   Blood Culture    Component Value Date/Time   SDES  08/23/2018 1209    URINE,  RANDOM Performed at [facility], [address] [place], [place] [address]    [facility]  08/23/2018 1209    NONE Performed at [facility], [address], [place], [place] [address]    CULT  08/23/2018 1209    NO GROWTH Performed at [facility], [place] [address] [place], [place] [address]    REPTSTATUS 08/25/2018 FINAL 08/23/2018 1209    Cardiac Enzymes: Recent Labs  Lab 08/23/18 1325 08/24/18 0134  TROPONINI 0.03* 0.03*   CBG: Recent Labs  Lab 08/29/18 1129 08/29/18 1552 08/29/18 2137 08/30/18 0823 08/30/18 1008  GLUCAP 164* 137* 132* 181* 194*   Iron Studies:  Recent Labs    08/29/18 0448  FERRITIN 3,803*   Lab Results  Component Value Date   INR 0.99 01/30/2016   INR 1.19 07/13/2013   INR 1.27 07/12/2013   Studies/Results: No results found. Medications: . sodium chloride 100 mL (08/27/18 0519)  . sodium chloride     . amiodarone  400 mg Oral BID  . aspirin EC  81 mg Oral Daily  . atorvastatin  80 mg Oral q1800  . calcium acetate  2,001 mg Oral TID WC  . Chlorhexidine Gluconate Cloth  6 each Topical Q0600  . darbepoetin (ARANESP) injection - DIALYSIS  60 mcg Intravenous Q Wed-HD  . feeding supplement (NEPRO CARB STEADY)  237 mL Oral BID BM  . ferrous sulfate  325 mg Oral Q breakfast  . heparin  5,000 Units Subcutaneous Q8H  . insulin aspart  0-5 Units Subcutaneous QHS  . insulin aspart  0-9 Units Subcutaneous TID WC  . insulin aspart  3 Units Subcutaneous TID WC  . insulin glargine  45 Units Subcutaneous Daily  . levothyroxine  50 mcg Oral Q0600  . mouth rinse  15 mL Mouth Rinse BID  . methylPREDNISolone (SOLU-MEDROL) injection  50 mg Intravenous Q12H  . midodrine  10 mg Oral BID PC  . mirtazapine  7.5 mg Oral QHS  . multivitamin  1 tablet Oral QHS  . pantoprazole  40 mg Oral Daily  . polyethylene glycol  17 g Oral Daily  . vitamin C  500 mg Oral Daily  . zinc sulfate  220 mg Oral Daily

## 2018-08-31 DIAGNOSIS — F4321 Adjustment disorder with depressed mood: Secondary | ICD-10-CM

## 2018-08-31 LAB — LACTATE DEHYDROGENASE: LDH: 410 U/L — ABNORMAL HIGH (ref 98–192)

## 2018-08-31 LAB — BASIC METABOLIC PANEL
Anion gap: 16 — ABNORMAL HIGH (ref 5–15)
BUN: 88 mg/dL — ABNORMAL HIGH (ref 8–23)
CO2: 24 mmol/L (ref 22–32)
Calcium: 9.5 mg/dL (ref 8.9–10.3)
Chloride: 93 mmol/L — ABNORMAL LOW (ref 98–111)
Creatinine, Ser: 9.84 mg/dL — ABNORMAL HIGH (ref 0.44–1.00)
GFR calc Af Amer: 4 mL/min — ABNORMAL LOW (ref >60)
GFR calc non Af Amer: 4 mL/min — ABNORMAL LOW (ref >60)
Glucose, Bld: 278 mg/dL — ABNORMAL HIGH (ref 70–99)
Potassium: 5.7 mmol/L — ABNORMAL HIGH (ref 3.5–5.1)
Sodium: 133 mmol/L — ABNORMAL LOW (ref 135–145)

## 2018-08-31 LAB — GLUCOSE, CAPILLARY
Glucose-Capillary: 204 mg/dL — ABNORMAL HIGH (ref 70–99)
Glucose-Capillary: 288 mg/dL — ABNORMAL HIGH (ref 70–99)
Glucose-Capillary: 247 mg/dL — ABNORMAL HIGH (ref 70–99)
Glucose-Capillary: 265 mg/dL — ABNORMAL HIGH (ref 70–99)

## 2018-08-31 LAB — RENAL FUNCTION PANEL
Albumin: 2.5 g/dL — ABNORMAL LOW (ref 3.5–5.0)
Anion gap: 19 — ABNORMAL HIGH (ref 5–15)
BUN: 77 mg/dL — ABNORMAL HIGH (ref 8–23)
CO2: 24 mmol/L (ref 22–32)
Calcium: 8.9 mg/dL (ref 8.9–10.3)
Chloride: 92 mmol/L — ABNORMAL LOW (ref 98–111)
Creatinine, Ser: 9.27 mg/dL — ABNORMAL HIGH (ref 0.44–1.00)
GFR calc Af Amer: 5 mL/min — ABNORMAL LOW (ref >60)
GFR calc non Af Amer: 4 mL/min — ABNORMAL LOW (ref >60)
Glucose, Bld: 221 mg/dL — ABNORMAL HIGH (ref 70–99)
Phosphorus: 6.8 mg/dL — ABNORMAL HIGH (ref 2.5–4.6)
Potassium: 6.4 mmol/L (ref 3.5–5.1)
Sodium: 135 mmol/L (ref 135–145)

## 2018-08-31 MED ORDER — SODIUM POLYSTYRENE SULFONATE 15 GM/60ML PO SUSP
30.0000 g | Freq: Once | ORAL | Status: AC
  Administered 2018-08-31: 30 g via ORAL
  Filled 2018-08-31: qty 120

## 2018-08-31 MED ORDER — CHLORHEXIDINE GLUCONATE CLOTH 2 % EX PADS: 6.0000 | MEDICATED_PAD | Freq: Every day | CUTANEOUS | Status: DC

## 2018-08-31 MED ORDER — CHLORHEXIDINE GLUCONATE CLOTH 2 % EX PADS
6.0000 | MEDICATED_PAD | Freq: Every day | CUTANEOUS | Status: DC
  Administered 2018-09-01 – 2018-09-02 (×2): 6 via TOPICAL

## 2018-08-31 NOTE — Progress Notes (Signed)
This RN talked to patient's husband and updated him on her plan of care. The husband was very appreciative and voiced no concern. Will continue to monitor.

## 2018-08-31 NOTE — Progress Notes (Signed)
[facility]    NEPHROLOGY PROGRESS NOTE  SUBJECTIVE:  Patient has had hyperkalemia - labs with slight hemolysis but K has been trending up recently.  Now on 3L oxygen.  No repeat K.   She has been depressed and hard to motivate and somnolent per nurse.  She has also developed worsening deconditioning and she states PT and OT have been consulted.   Pt does not answer her phone.  Had a telemed visit with psych.    Due to the nature of this patient's suspected COVID-19 with isolation and in keeping with efforts to prevent the spread of infection and to conserve personal protective equipment, a physical exam was not personally performed.  Patient's symptoms and exam were discussed in detail with the RN.  A chart review of other providers notes and the patient's lab work as well as review of other pertinent studies was performed.  Exam details from prior documentation were reviewed specifically and confirmed with the bedside nurse.  Location of service: [facility]   OBJECTIVE:  Vitals:   08/31/18 0605 08/31/18 0831  BP:  (!) 129/57  Pulse:  72  Resp:  12  Temp:  97.6 F (36.4 C)  SpO2: 96% 100%    Intake/Output Summary (Last 24 hours) at 08/31/2018 1346 Last data filed at 08/31/2018 0831 Gross per 24 hour  Intake 240 ml  Output 0 ml  Net 240 ml     MEDICATIONS:  . amiodarone  400 mg Oral BID  . aspirin EC  81 mg Oral Daily  . atorvastatin  80 mg Oral q1800  . calcium acetate  2,001 mg Oral TID WC  . Chlorhexidine Gluconate Cloth  6 each Topical Q0600  . darbepoetin (ARANESP) injection - DIALYSIS  60 mcg Intravenous Q Wed-HD  . feeding supplement (NEPRO CARB STEADY)  237 mL Oral BID BM  . ferrous sulfate  325 mg Oral Q breakfast  . heparin  5,000 Units Subcutaneous Q8H  . insulin aspart  0-5 Units Subcutaneous QHS  . insulin aspart  0-9 Units Subcutaneous TID WC  . insulin aspart  3 Units Subcutaneous TID WC  . insulin glargine  45 Units Subcutaneous Daily   . levothyroxine  50 mcg Oral Q0600  . mouth rinse  15 mL Mouth Rinse BID  . methylPREDNISolone (SOLU-MEDROL) injection  50 mg Intravenous Q12H  . midodrine  10 mg Oral BID PC  . mirtazapine  7.5 mg Oral QHS  . multivitamin  1 tablet Oral QHS  . pantoprazole  40 mg Oral Daily  . polyethylene glycol  17 g Oral Daily  . vitamin C  500 mg Oral Daily  . zinc sulfate  220 mg Oral Daily    Exam:   Exam is per nursing.  Appreciate RNs assistance  RUE AVF  No edema lower extremities  Unlabored respirations  Sleepy and depressed affect.  Not confused. On 3 liters oxygen  Physically deconditioned    LABS:   CBC Latest Ref Rng & Units 08/28/2018 08/25/2018 08/23/2018  WBC 4.0 - 10.5 K/uL 10.6(H) 6.3 5.0  Hemoglobin 12.0 - 15.0 g/dL 9.9(L) 9.2(L) 9.5(L)  Hematocrit 36.0 - 46.0 % 30.2(L) 27.6(L) 29.3(L)  Platelets 150 - 400 K/uL 234 147(L) 121(L)    CMP Latest Ref Rng & Units 08/31/2018 08/30/2018 08/29/2018  Glucose 70 - 99 mg/dL 221(H) 185(H) 211(H)  BUN 8 - 23 mg/dL 77(H) 50(H) 67(H)  Creatinine 0.44 - 1.00 mg/dL 9.27(H) 7.26(H) 9.72(H)  Sodium 135 -  145 mmol/L 135 136 136  Potassium 3.5 - 5.1 mmol/L 6.4(HH) 4.9 5.6(H)  Chloride 98 - 111 mmol/L 92(L) 94(L) 95(L)  CO2 22 - 32 mmol/L 24 25 23   Calcium 8.9 - 10.3 mg/dL 8.9 8.8(L) 9.0  Total Protein 6.5 - 8.1 g/dL - - -  Total Bilirubin 0.3 - 1.2 mg/dL - - -  Alkaline Phos 38 - 126 U/L - - -  AST 15 - 41 U/L - - -  ALT 0 - 44 U/L - - -    Lab Results  Component Value Date   PTH 135 (H) 02/22/2017   CALCIUM 8.9 08/31/2018   CAION 1.20 07/03/2013   PHOS 6.8 (H) 08/31/2018       Component Value Date/Time   COLORURINE YELLOW (A) 08/23/2018 1209   APPEARANCEUR CLOUDY (A) 08/23/2018 1209   APPEARANCEUR Hazy 07/23/2013 0741   LABSPEC 1.016 08/23/2018 1209   LABSPEC 1.016 07/23/2013 0741   PHURINE 5.0 08/23/2018 1209   GLUCOSEU NEGATIVE 08/23/2018 1209   GLUCOSEU Negative 07/23/2013 0741   HGBUR SMALL (A) 08/23/2018 1209    BILIRUBINUR NEGATIVE 08/23/2018 1209   BILIRUBINUR Negative 07/23/2013 0741   KETONESUR NEGATIVE 08/23/2018 1209   PROTEINUR 100 (A) 08/23/2018 1209   UROBILINOGEN 0.2 06/30/2013 2213   NITRITE NEGATIVE 08/23/2018 1209   LEUKOCYTESUR SMALL (A) 08/23/2018 1209   LEUKOCYTESUR Negative 07/23/2013 0741      Component Value Date/Time   PHART 7.371 07/02/2013 1808   PCO2ART 42.9 07/02/2013 1808   PO2ART 79.0 (L) 07/02/2013 1808   HCO3 25.4 (H) 07/02/2013 1903   TCO2 24 07/03/2013 1754   O2SAT 95.0 07/02/2013 1903       Component Value Date/Time   IRON 64 08/11/2015 0945   FERRITIN 3,803 (H) 08/29/2018 0448   IRONPCTSAT 23.8 08/11/2015 0945       ASSESSMENT/PLAN:    63 year old female patient with a past medical history significant for end-stage renal disease on hemodialysis Monday, Wednesday, and Friday presented for dialysis in transfer from [place].  She presented on 08/25/18, with her last dialysis being the prior Monday.  She had progressive weakness likely secondary to COVID-19, and was unable to make it to dialysis.  1.  End-stage renal disease.   - repeating K noting slight hemolysis on labs obtained this AM - Plan for 2 hr HD today given hyperkalemia - She will then continue with additional HD per MWF schedule.  On midodrine.  Syncope on dialysis 6/22 may have been secondary to UF goal.  (per charting had attempted to optimize volume with covid).   - She will need to dialyze at [facility] 3rd shift if she discharges while still covid positive.  This has been set up for when she is deemed medically acceptable for discharge (but physically deconditioned at this time).    2.  COVID-19 positive. Per primary team.  Continues on supplemental oxygen   3.  Anemia of chronic disease.  Likely secondary to end-stage renal disease as well as acute illnesss.  No acute indication for PRBC's. CBC in AM.  On aranesp weekly   4.  Type 2 diabetes.  per primary team   5.  Hx  Hypertension.  Now with hypotension and on a home regimen of midodrine 5 mg BID on non-HD days and 10 mg BID on dialysis days.  Follow on the 10 mg BID   6. New a fib - cardiology is evaluating and has started amio   7. AMS - would  avoid sedating medications.  From a renal standpoint, I have discontinued gabapentin 600 mg daily and if her mental status improves she could have a max dose of 300 mg daily

## 2018-08-31 NOTE — Consult Note (Addendum)
Telepsych Consultation   Reason for Consult:  ''depression'' Referring Physician:  Dr. Nevada Crane Location of Patient: Jamie Arias Location of Provider: [facility]  Patient Identification: Jamie Arias MRN:  [id number] Principal Diagnosis: COVID-19 virus infection Diagnosis:  Principal Problem:   COVID-19 virus infection Active Problems:   HTN (hypertension)   ESRD (end stage renal disease) ([place])   Acute on chronic respiratory failure with hypoxia ([place])   UTI (urinary tract infection)   Adjustment disorder with depressed mood   Total Time spent with patient: 45 minutes  Subjective:   [name] is a 63 y.o. female patient transferred to [facility]  for hemodialysis and Covid-19 positive  HPI:  Patient with medical history significant ofESRD on HDMWF,anemia, chronic diastolic congestive heart failure, CAD status post PCI, type 2 diabetes, hypertension, hyperlipidemia, hypothyroidism but denies prior history of Mental illness. She was admitted to the hospital for Hemodialysis and Covid-19positive management. Patient reports that she has been feeling depressed for more than 2 weeks. She reports low energy level, anhedonia, fatigue, lack of motivation and excessive worries about her health problem. She states that current medication prescribed for depression is not helping at all. She denies psychosis, delusions, suicidal/homicidal ideations, intent or plan.   Past Psychiatric History: none reported  Risk to Self:  denies Risk to Others:  denies Prior Inpatient Therapy:  none Prior Outpatient Therapy:    Past Medical History:  Past Medical History:  Diagnosis Date  . Anemia   . Chronic diastolic CHF (congestive heart failure) ([place])    a. Due to ischemic cardiomyopathy. EF as low as 35%, improved to normal s/p CABG; b. echo 07/06/13: EF 55-60%, no RWMAs, mod TR, trivial pericardial effusion not c/w tamponade physiology;  c. 10/2015 Echo: EF 65%, Gr1 DD, triv AI, mild MR,  mildly dil LA, mod TR, PASP 79mmHg.  Marland Kitchen Coronary artery disease    a. NSTEMI 06/2013; b.cath: severe three-vessel CAD w/ EF 30% & mild-mod MR; c. s/p 3 vessel CABG 07/02/13 (LIMA-LAD, SVG-OM, and SVG-RPDA);  d. 10/2015 MV: no ischemia/infarct.  . Diabetes mellitus without complication ([place])   . Diabetic neuropathy ([place])   . Dialysis patient [facility])    MWF  . ESRD (end stage renal disease) ([place])    a. 12/2015 initiated - mwf dialysis.  Marland Kitchen GERD (gastroesophageal reflux disease)   . Hyperlipidemia   . Hypertension   . Hypothyroidism   . Myocardial infarction ([place]) 2015  . Neuropathy   . Pleural effusion 2015  . Pulmonary hypertension ([place])   . Wears dentures    full lower    Past Surgical History:  Procedure Laterality Date  . A/V FISTULAGRAM Right 12/17/2016   Procedure: A/V Fistulagram;  Surgeon: Algernon Huxley, MD;  Location: [place] [facility];  Service: Cardiovascular;  Laterality: Right;  . A/V FISTULAGRAM Right 01/07/2017   Procedure: A/V Fistulagram;  Surgeon: Algernon Huxley, MD;  Location: [place] [facility];  Service: Cardiovascular;  Laterality: Right;  . A/V FISTULAGRAM Right 12/03/2017   Procedure: A/V FISTULAGRAM;  Surgeon: Katha Cabal, MD;  Location: [place] [facility];  Service: Cardiovascular;  Laterality: Right;  . A/V SHUNT INTERVENTION N/A 02/22/2017   Procedure: A/V SHUNT INTERVENTION;  Surgeon: Algernon Huxley, MD;  Location: [place] [facility];  Service: Cardiovascular;  Laterality: N/A;  . AV FISTULA PLACEMENT Right 02/03/2016   Procedure: INSERTION OF ARTERIOVENOUS (AV) GORE-TEX GRAFT ARM ( BRACH / AXILLARY );  Surgeon: Katha Cabal, MD;  Location: [facility];  Service: Vascular;  Laterality: Right;  . CARDIAC CATHETERIZATION    . CATARACT EXTRACTION Bilateral   . CHOLECYSTECTOMY N/A 12/09/2014   Procedure: LAPAROSCOPIC CHOLECYSTECTOMY;  Surgeon: Marlyce [name], MD;  Location: [facility];  Service: General;  Laterality: N/A;  . CORONARY ARTERY BYPASS  GRAFT N/A 07/02/2013   Procedure: CORONARY ARTERY BYPASS GRAFTING (CABG);  Surgeon: Ivin [name], MD;  Location: [place];  Service: Open Heart Surgery;  Laterality: N/A;  CABG x three, using left internal mammary artery and right leg greater saphenous vein harvested endoscopically  . ESOPHAGOGASTRODUODENOSCOPY (EGD) WITH PROPOFOL N/A 11/24/2015   Procedure: ESOPHAGOGASTRODUODENOSCOPY (EGD) WITH PROPOFOL;  Surgeon: [name], MD;  Location: [place];  Service: Endoscopy;  Laterality: N/A;  Diabetic - insulin  . EYE SURGERY Bilateral    Cataract Extraction with IOL  . INTRAOPERATIVE TRANSESOPHAGEAL ECHOCARDIOGRAM N/A 07/02/2013   Procedure: INTRAOPERATIVE TRANSESOPHAGEAL ECHOCARDIOGRAM;  Surgeon: Ivin [name], MD;  Location: [place];  Service: Open Heart Surgery;  Laterality: N/A;  . PERIPHERAL VASCULAR CATHETERIZATION Right 12/06/2015   Procedure: Dialysis/Perma Catheter Insertion;  Surgeon: Katha Cabal, MD;  Location: [place] [facility];  Service: Cardiovascular;  Laterality: Right;  . PERIPHERAL VASCULAR THROMBECTOMY Right 09/28/2016   Procedure: Peripheral Vascular Thrombectomy;  Surgeon: Katha Cabal, MD;  Location: [place] [facility];  Service: Cardiovascular;  Laterality: Right;  . PERIPHERAL VASCULAR THROMBECTOMY Right 05/20/2018   Procedure: PERIPHERAL VASCULAR THROMBECTOMY;  Surgeon: Katha Cabal, MD;  Location: [place] [facility];  Service: Cardiovascular;  Laterality: Right;  . PORTA CATH REMOVAL N/A 06/01/2016   Procedure: Glori [name] Cath Removal;  Surgeon: Katha Cabal, MD;  Location: [place] [facility];  Service: Cardiovascular;  Laterality: N/A;  . THORACENTESIS Left 2015   Family History:  Family History  Problem Relation Age of Onset  . COPD Mother   . Cancer Mother        Lung  . Pulmonary embolism Father   . Diabetes Father   . Diabetes Paternal Grandfather   . Heart disease Maternal Grandmother   . Colon cancer Neg Hx   . Colon polyps  Neg Hx   . Esophageal cancer Neg Hx   . Pancreatic cancer Neg Hx   . Liver disease Neg Hx    Family Psychiatric  History: Social History:  Social History   Substance and Sexual Activity  Alcohol Use No  . Alcohol/week: 0.0 standard drinks     Social History   Substance and Sexual Activity  Drug Use No    Social History   Socioeconomic History  . Marital status: Married    Spouse name: Not on file  . Number of children: Not on file  . Years of education: Not on file  . Highest education level: Not on file  Occupational History  . Occupation: works for [employer]  . Financial resource strain: Not on file  . Food insecurity    Worry: Not on file    Inability: Not on file  . Transportation needs    Medical: Not on file    Non-medical: Not on file  Tobacco Use  . Smoking status: Never Smoker  . Smokeless tobacco: Never Used  Substance and Sexual Activity  . Alcohol use: No    Alcohol/week: 0.0 standard drinks  . Drug use: No  . Sexual activity: Not on file  Lifestyle  . Physical activity    Days per week: Not on file  Minutes per session: Not on file  . Stress: Not on file  Relationships  . Social Herbalist on phone: Not on file    Gets together: Not on file    Attends religious service: Not on file    Active member of club or organization: Not on file    Attends meetings of clubs or organizations: Not on file    Relationship status: Not on file  Other Topics Concern  . Not on file  Social History Narrative   Patient lives at home with her husband. Patient has 4 adult children.   Additional Social History:    Allergies:   Allergies  Allergen Reactions  . Nsaids Other (See Comments)    Contraindicated due to kidney disease.  Marland Kitchen Doxycycline Other (See Comments)    tremor    Labs:  Results for orders placed or performed during the hospital encounter of 08/23/18 (from the past 48 hour(s))  Glucose, capillary      Status: Abnormal   Collection Time: 08/29/18  3:52 PM  Result Value Ref Range   Glucose-Capillary 137 (H) 70 - 99 mg/dL  Glucose, capillary     Status: Abnormal   Collection Time: 08/29/18  9:37 PM  Result Value Ref Range   Glucose-Capillary 132 (H) 70 - 99 mg/dL  SARS Coronavirus 2 [facility] order, Performed in [place] hospital lab)     Status: Abnormal   Collection Time: 08/30/18  5:51 AM   Specimen: Nasopharyngeal Swab  Result Value Ref Range   SARS Coronavirus 2 POSITIVE (A) NEGATIVE    Comment: CRITICAL RESULT CALLED TO, READ BACK BY AND VERIFIED WITH: RN Alphia Moh [id number] AT 614 AM BY CM (NOTE) If result is NEGATIVE SARS-CoV-2 target nucleic acids are NOT DETECTED. The SARS-CoV-2 RNA is generally detectable in upper and lower  respiratory specimens during the acute phase of infection. The lowest  concentration of SARS-CoV-2 viral copies this assay can detect is 250  copies / mL. A negative result does not preclude SARS-CoV-2 infection  and should not be used as the sole basis for treatment or other  patient management decisions.  A negative result may occur with  improper specimen collection / handling, submission of specimen other  than nasopharyngeal swab, presence of viral mutation(s) within the  areas targeted by this assay, and inadequate number of viral copies  (<250 copies / mL). A negative result must be combined with clinical  observations, patient history, and epidemiological information. If result is POSITIVE SARS-CoV-2 target nucleic acids are DE TECTED. The SARS-CoV-2 RNA is generally detectable in upper and lower  respiratory specimens during the acute phase of infection.  Positive  results are indicative of active infection with SARS-CoV-2.  Clinical  correlation with patient history and other diagnostic information is  necessary to determine patient infection status.  Positive results do  not rule out bacterial infection or co-infection with other  viruses. If result is PRESUMPTIVE POSTIVE SARS-CoV-2 nucleic acids MAY BE PRESENT.   A presumptive positive result was obtained on the submitted specimen  and confirmed on repeat testing.  While 2019 novel coronavirus  (SARS-CoV-2) nucleic acids may be present in the submitted sample  additional confirmatory testing may be necessary for epidemiological  and / or clinical management purposes  to differentiate between  SARS-CoV-2 and other Sarbecovirus currently known to infect humans.  If clinically indicated additional testing with an alternate test  methodology (L [id number]) is advised. The SARS-CoV-2 RNA is generally  detectable in upper and lower respiratory specimens during the acute  phase of infection. The expected result is Negative. Fact Sheet for Patients:  StrictlyIdeas.no Fact Sheet for Healthcare Providers: BankingDealers.co.za This test is not yet approved or cleared by the [place] FDA and has been authorized for detection and/or diagnosis of SARS-CoV-2 by FDA under an Emergency Use Authorization (EUA).  This EUA will remain in effect (meaning this test can be used) for the duration of the COVID-19 declaration under Section 564(b)(1) of the Act, 21 U.S.C. section 360bbb-3(b)(1), unless the authorization is terminated or revoked sooner. Performed at [facility], [place] [address], [place], [place] [address]   Renal function panel     Status: Abnormal   Collection Time: 08/30/18  6:04 AM  Result Value Ref Range   Sodium 136 135 - 145 mmol/L   Potassium 4.9 3.5 - 5.1 mmol/L   Chloride 94 (L) 98 - 111 mmol/L   CO2 25 22 - 32 mmol/L   Glucose, Bld 185 (H) 70 - 99 mg/dL   BUN 50 (H) 8 - 23 mg/dL   Creatinine, Ser 7.26 (H) 0.44 - 1.00 mg/dL   Calcium 8.8 (L) 8.9 - 10.3 mg/dL   Phosphorus 5.6 (H) 2.5 - 4.6 mg/dL   Albumin 2.5 (L) 3.5 - 5.0 g/dL   GFR calc non Af Amer 5 (L) >60 mL/min   GFR calc Af Amer 6 (L) >60  mL/min   Anion gap 17 (H) 5 - 15    Comment: Performed at [facility], [address] [place], [place] [address]  Lactate dehydrogenase     Status: Abnormal   Collection Time: 08/30/18  6:04 AM  Result Value Ref Range   LDH 255 (H) 98 - 192 U/L    Comment: Performed at [facility], [place] [address], [place], [place] [address]  Glucose, capillary     Status: Abnormal   Collection Time: 08/30/18  8:23 AM  Result Value Ref Range   Glucose-Capillary 181 (H) 70 - 99 mg/dL  Glucose, capillary     Status: Abnormal   Collection Time: 08/30/18 10:08 AM  Result Value Ref Range   Glucose-Capillary 194 (H) 70 - 99 mg/dL  Glucose, capillary     Status: Abnormal   Collection Time: 08/30/18 11:58 AM  Result Value Ref Range   Glucose-Capillary 206 (H) 70 - 99 mg/dL  Glucose, capillary     Status: Abnormal   Collection Time: 08/30/18  4:55 PM  Result Value Ref Range   Glucose-Capillary 221 (H) 70 - 99 mg/dL  Glucose, capillary     Status: Abnormal   Collection Time: 08/30/18 10:19 PM  Result Value Ref Range   Glucose-Capillary 181 (H) 70 - 99 mg/dL  Renal function panel     Status: Abnormal   Collection Time: 08/31/18  6:55 AM  Result Value Ref Range   Sodium 135 135 - 145 mmol/L   Potassium 6.4 (HH) 3.5 - 5.1 mmol/L    Comment: J.VANOE RN @ [phone] 08/31/2018 BY C.[facility] HEMOLYSIS CRITICAL RESULT CALLED TO, READ BACK BY AND VERIFIED WITH:    Chloride 92 (L) 98 - 111 mmol/L   CO2 24 22 - 32 mmol/L   Glucose, Bld 221 (H) 70 - 99 mg/dL   BUN 77 (H) 8 - 23 mg/dL   Creatinine, Ser 9.27 (H) 0.44 - 1.00 mg/dL   Calcium 8.9 8.9 - 10.3 mg/dL   Phosphorus 6.8 (H) 2.5 - 4.6  mg/dL   Albumin 2.5 (L) 3.5 - 5.0 g/dL   GFR calc non Af Amer 4 (L) >60 mL/min   GFR calc Af Amer 5 (L) >60 mL/min   Anion gap 19 (H) 5 - 15    Comment: Performed at [place] [address] [place], [place] [address]  Lactate dehydrogenase     Status: Abnormal   Collection Time: 08/31/18  6:55 AM   Result Value Ref Range   LDH 410 (H) 98 - 192 U/L    Comment: Performed at [facility], [place] [address] [place], [place] [address]  Glucose, capillary     Status: Abnormal   Collection Time: 08/31/18  8:21 AM  Result Value Ref Range   Glucose-Capillary 204 (H) 70 - 99 mg/dL  Glucose, capillary     Status: Abnormal   Collection Time: 08/31/18 12:45 PM  Result Value Ref Range   Glucose-Capillary 288 (H) 70 - 99 mg/dL    Medications:  Current Facility-Administered Medications  Medication Dose Route Frequency Provider Last Rate Last Dose  . 0.9 %  sodium chloride infusion  100 mL Intravenous PRN Finnigan, Izora Gala A, DO 999 mL/hr at 08/27/18 0519 100 mL at 08/27/18 0519  . 0.9 %  sodium chloride infusion  100 mL Intravenous PRN Finnigan, Nancy A, DO      . acetaminophen (TYLENOL) tablet 650 mg  650 mg Oral Q6H PRN Donne Hazel, MD   650 mg at 08/27/18 0902  . amiodarone (PACERONE) tablet 400 mg  400 mg Oral BID Kathyrn Drown D, NP   400 mg at 08/31/18 3016  . aspirin EC tablet 81 mg  81 mg Oral Daily Donne Hazel, MD   81 mg at 08/31/18 0825  . atorvastatin (LIPITOR) tablet 80 mg  80 mg Oral q1800 Donne Hazel, MD   80 mg at 08/30/18 1719  . calcium acetate (PHOSLO) capsule 2,001 mg  2,001 mg Oral TID WC Roney Jaffe, MD   2,001 mg at 08/31/18 1247  . Chlorhexidine Gluconate Cloth 2 % PADS 6 each  6 each Topical Q0600 Claudia Desanctis, MD   6 each at 08/31/18 [phone]  . Darbepoetin Alfa (ARANESP) injection 60 mcg  60 mcg Intravenous Q Wed-HD Claudia Desanctis, MD   60 mcg at 08/27/18 1604  . feeding supplement (NEPRO CARB STEADY) liquid 237 mL  237 mL Oral BID BM Alric Seton, PA-C 0 mL/hr at 08/29/18 0157 237 mL at 08/31/18 1247  . ferrous sulfate tablet 325 mg  325 mg Oral Q breakfast Donne Hazel, MD   325 mg at 08/31/18 0825  . heparin injection 5,000 Units  5,000 Units Subcutaneous Q8H Shela Leff, MD   5,000 Units at 08/31/18 1246  . insulin aspart (novoLOG)  injection 0-5 Units  0-5 Units Subcutaneous QHS Shela Leff, MD   4 Units at 08/26/18 2105  . insulin aspart (novoLOG) injection 0-9 Units  0-9 Units Subcutaneous TID WC Shela Leff, MD   5 Units at 08/31/18 1247  . insulin aspart (novoLOG) injection 3 Units  3 Units Subcutaneous TID WC Donne Hazel, MD   3 Units at 08/31/18 1246  . insulin glargine (LANTUS) injection 45 Units  45 Units Subcutaneous Daily Donne Hazel, MD   45 Units at 08/31/18 [phone]  . levothyroxine (SYNTHROID) tablet 50 mcg  50 mcg Oral Q0600 Kayleen Memos, DO   50 mcg at 08/31/18 5732  . lidocaine (PF) (XYLOCAINE)  1 % injection 5 mL  5 mL Intradermal PRN Finnigan, Nancy A, DO      . lidocaine-prilocaine (EMLA) cream 1 application  1 application Topical PRN Finnigan, Nancy A, DO      . MEDLINE mouth rinse  15 mL Mouth Rinse BID Shela Leff, MD   15 mL at 08/31/18 0826  . methylPREDNISolone sodium succinate (SOLU-MEDROL) 125 mg/2 mL injection 50 mg  50 mg Intravenous Q12H Shela Leff, MD   50 mg at 08/31/18 1246  . midodrine (PROAMATINE) tablet 10 mg  10 mg Oral BID PC Claudia Desanctis, MD   10 mg at 08/31/18 0825  . mirtazapine (REMERON) tablet 7.5 mg  7.5 mg Oral QHS Hall, Carole N, DO   7.5 mg at 08/30/18 2226  . multivitamin (RENA-VIT) tablet 1 tablet  1 tablet Oral QHS Alric Seton, PA-C   1 tablet at 08/30/18 2226  . ondansetron (ZOFRAN) injection 4 mg  4 mg Intravenous Q6H PRN Irene Pap N, DO   4 mg at 08/30/18 1720  . oxyCODONE (Oxy IR/ROXICODONE) immediate release tablet 5 mg  5 mg Oral Q6H PRN Irene Pap N, DO   5 mg at 08/29/18 2113  . pantoprazole (PROTONIX) EC tablet 40 mg  40 mg Oral Daily Donne Hazel, MD   40 mg at 08/31/18 0826  . pentafluoroprop-tetrafluoroeth (GEBAUERS) aerosol 1 application  1 application Topical PRN Finnigan, Nancy A, DO      . polyethylene glycol (MIRALAX / GLYCOLAX) packet 17 g  17 g Oral Daily Irene Pap N, DO   17 g at 08/31/18 0825  . vitamin C  (ASCORBIC ACID) tablet 500 mg  500 mg Oral Daily Donne Hazel, MD   500 mg at 08/31/18 0825  . zinc sulfate capsule 220 mg  220 mg Oral Daily Donne Hazel, MD   220 mg at 08/31/18 0825    Musculoskeletal: Strength & Muscle Tone: not tested  Gait & Station: not tested Patient leans: N/A  Psychiatric Specialty Exam: Physical Exam  Psychiatric: Judgment and thought content normal. Her speech is delayed. She is slowed and withdrawn. Cognition and memory are normal. She exhibits a depressed mood.    Review of Systems  Constitutional: Positive for malaise/fatigue.  HENT: Negative.   Eyes: Negative.   Respiratory: Negative.   Cardiovascular: Negative.   Gastrointestinal: Negative.   Genitourinary: Negative.   Musculoskeletal: Negative.   Skin: Negative.   Neurological: Negative.   Endo/Heme/Allergies: Negative.   Psychiatric/Behavioral: Positive for depression.    Blood pressure (!) 129/57, pulse 72, temperature 97.6 F (36.4 C), temperature source Oral, resp. rate 12, height 5\' 4"  (1.626 m), weight 94 kg, SpO2 100 %.Body mass index is 35.57 kg/m.  General Appearance: Casual  Eye Contact:  Good  Speech:  Clear and Coherent and Slow  Volume:  Decreased  Mood:  Dysphoric  Affect:  Constricted  Thought Process:  Coherent and Linear  Orientation:  Full (Time, Place, and Person)  Thought Content:  Logical  Suicidal Thoughts:  No  Homicidal Thoughts:  No  Memory:  Immediate;   Fair Recent;   Fair Remote;   Fair  Judgement:  Intact  Insight:  Fair  Psychomotor Activity:  Psychomotor Retardation  Concentration:  Concentration: Fair and Attention Span: Fair  Recall:  [employer] of Knowledge:  Fair  Language:  Good  Akathisia:  No  Handed:  Right  AIMS (if indicated):     Assets:  Desire for Improvement  ADL's:  Intact  Cognition:  WNL  Sleep:   fair      Treatment Plan Summary: 63 year old woman with multiple medical problem who denies prior history of mental  illness, she was admitted for hemodialysis and  management of Covid-19. In addition patient reports that she has been having worsening depressive symptoms but denies psychosis, delusions, suicidal ideation, intent or plan.  Diagnosis: Adjustment disorder with depression  Recommendations: -D/C Mirtazapine, may cause weight gain, dependent edema in patient with ESRF and hypertension. -Consider Prozac 20 mg daily for depression. -Consider Trazodone 50 mg at bedtime as needed for sleep. - Consider referring patient to a therapist upon discharge.   Disposition: No evidence of imminent risk to self or others at present.   Patient does not meet criteria for psychiatric inpatient admission. Supportive therapy provided about ongoing stressors. Psychiatric service signing out. Re-consult as needed  This service was provided via telemedicine using a 2-way, interactive audio and video technology.  Names of all persons participating in this telemedicine service and their role in this encounter. Name: [name] Role: Patient  Name: Corena Pilgrim, MD Role: Psychiatrist    Corena Pilgrim, MD 08/31/2018 1:12 PM

## 2018-08-31 NOTE — Progress Notes (Signed)
PROGRESS NOTE  Jamie Arias WCB:[id number] DOB: May 18, 1955 DOA: 08/23/2018 PCP: [name], MD  HPI/Recap of past 24 hours: 63 y.o.femalewith medical history significant ofESRD on HDMWF,anemia, chronic diastolic congestive heart failure, CAD status post PCI, type 2 diabetes, hypertension, hyperlipidemia, hypothyroidism, and conditions listed belowpresenting as a transfer from [facility]. COVID-19positive. Patient transferred to [facility] for management of COVID-19 and hemodialysis.Patient states her last dialysis was on Monday, June 15 and since then she has not been able to go because she is not feeling well. Reports having generalized weakness. Reports having intermittent tremors for the past few months. Denies family history of tremors. Denies any fevers, chills, chest pain, shortness of breath, cough, nausea, vomiting, abdominal pain, diarrhea, dysuria, or urinary frequency/urgency. No additional history could be obtained from her.  Reports depression. Not on antidepressant. Psych consulted.  08/31/18: Patient seen and examined her bedside.  She has no new complaints.  She states she feels better after starting Remeron.  Continues to have flat affect.  Consulted psych, awaiting evaluation.  Generalized weakness, PT OT to reassess.   Assessment/Plan: Principal Problem:   COVID-19 virus infection Active Problems:   HTN (hypertension)   ESRD (end stage renal disease) (HCC)   Acute on chronic respiratory failure with hypoxia (HCC)   UTI (urinary tract infection)  COVID-19 viral pneumonia Presented with a fever Still requiring O2 supplementation to maintain O2 saturation greater than 92% Continue inhalers and IV steroids Continue supportive treatments 1st covid 19 + on 08/23/18 Repeat covid 19 + in house + on 08/30/18  Newly diagnosed atrial fibrillation Continue amiodarone by cardiology Chads Vascor 5 Defer to cardiology for decision on anticoagulation Hold  2D echo per cardiology She follows with cardiology outpatient Dr. Jacqulyn Cane from [place].  Depression Not on antidepressant at home Psych reconsulted on 08/31/18. Awaiting evaluation Continue Remeron on 08/29/2018 due to depression and anorexia.  Physical debility/generalized weakness/debilitation from PT OT to reassess Fall precautions  Resolved headache/neck pain Last CT head 08/23/2018 showed no bony abnormalities from the base of the skull No acute intracranial findings  Hypothyroidism TSH is low Free T4 upper end of normal Reduce dose of levothyroxine  Acute hypoxic respiratory failure likely secondary to acute COVID-19 related pneumonia Presented with hypoxia with O2 saturation 85% on room air Improving Continue management as stated above Continue to maintain O2 saturation greater than 92%  Resolved hypotension in the setting of ESRD on dialysis Continue midodrine Maintain map greater than 65  ESRD on HD Monday Wednesday Friday Nephrology consulted and following Plan for hemodialysis today 08/29/2018 Continue to closely monitor blood pressure  Intractable nausea with no vomiting IV Zofran PRN 3 times daily  Chronic lower back pain P.o. oxycodone for severe pain.  Added bowel regimen to avoid constipation Tylenol for mild to moderate pain  Anemia of chronic disease likely secondary to ESRD Hemoglobin stable at 9 No sign of active bleeding  Type 2 diabetes with hyperglycemia Last hemoglobin A1c 7.1 on 08/16/17 Continue insulin sliding scale Obtain hemoglobin A1c  Physical deconditioning -PT/OT consulted with recommendation for CIR, have placed order  DVT prophylaxis: Heparin subq 3 times daily Code Status: Full Family Communication:  Will call if okay with the patient. Disposition Plan:  Undetermined, PT/OT will need to reassess on 08/31/2018.  Consultants:   Nephrology  Psychiatry  Procedures:     Antimicrobials:              Anti-infectives (From admission, onward)  Start        Dose/Rate  Route  Frequency  Ordered  Stop     08/25/18 1000    cefTRIAXone (ROCEPHIN) 1 g in sodium chloride 0.9 % 100 mL IVPB       1 g  200 mL/hr over 30 Minutes  Intravenous  Every 24 hours  08/24/18 0022        08/24/18 0500    azithromycin (ZITHROMAX) 500 mg in sodium chloride 0.9 % 250 mL IVPB  Status:  Discontinued       500 mg  250 mL/hr over 60 Minutes  Intravenous  Every 24 hours  08/24/18 0453  08/24/18 0454          Objective: Vitals:   08/30/18 1658 08/30/18 2230 08/31/18 0605 08/31/18 0831  BP: (!) 101/49 (!) 126/44  (!) 129/57  Pulse:    72  Resp: 18   12  Temp: (!) 97.4 F (36.3 C) 98.3 F (36.8 C)  97.6 F (36.4 C)  TempSrc: Oral Oral  Oral  SpO2: 99% 94% 96% 100%  Weight:      Height:       No intake or output data in the 24 hours ending 08/31/18 1200 Filed Weights   08/27/18 1616 08/29/18 0850 08/29/18 1150  Weight: 94 kg 94.4 kg 94 kg    Exam:  . General: 63 y.o. year-old female chronically ill-appearing no acute distress.  Alert and oriented x4.  .  Cardiovascular: Irregular rate and rhythm with no rubs or gallops noted.  Thyromegaly noted.   Marland Kitchen Respiratory: Clear to auscultation no wheezes or rales.  Poor inspiratory effort.  Abdomen: Obese nontender nondistended normal bowel sounds. . Musculoskeletal: Trace edema in lower extremities bilaterally.  2 out of 4 pulses in all 4. .  Psychiatry: Flat affect.   Data Reviewed: CBC: Recent Labs  Lab 08/25/18 0841 08/28/18 0548  WBC 6.3 10.6*  HGB 9.2* 9.9*  HCT 27.6* 30.2*  MCV 95.8 97.4  PLT 147* 009   Basic Metabolic Panel: Recent Labs  Lab 08/25/18 0841 08/26/18 0944 08/28/18 0524 08/29/18 0448 08/30/18 0604 08/31/18 0655  NA 129* 135 135 136 136 135  K 6.1* 5.2* 4.9 5.6* 4.9 6.4*  CL 88* 94* 95* 95* 94* 92*  CO2 17* 20* 24 23 25 24   GLUCOSE 474* 327* 208* 211* 185* 221*  BUN  117* 57* 41* 67* 50* 77*  CREATININE 19.53* 10.59* 7.65* 9.72* 7.26* 9.27*  CALCIUM 7.0* 7.6* 8.8* 9.0 8.8* 8.9  MG  --   --  2.2  --   --   --   PHOS 10.0*  --  5.7* 6.0* 5.6* 6.8*   GFR: Estimated Creatinine Clearance: 6.8 mL/min (A) (by C-G formula based on SCr of 9.27 mg/dL (H)). Liver Function Tests: Recent Labs  Lab 08/25/18 0841 08/28/18 0524 08/29/18 0448 08/30/18 0604 08/31/18 0655  ALBUMIN 2.8* 2.8* 2.6* 2.5* 2.5*   No results for input(s): LIPASE, AMYLASE in the last 168 hours. No results for input(s): AMMONIA in the last 168 hours. Coagulation Profile: No results for input(s): INR, PROTIME in the last 168 hours. Cardiac Enzymes: No results for input(s): CKTOTAL, CKMB, CKMBINDEX, TROPONINI in the last 168 hours. BNP (last 3 results) No results for input(s): PROBNP in the last 8760 hours. HbA1C: No results for input(s): HGBA1C in the last 72 hours. CBG: Recent Labs  Lab 08/30/18 1008 08/30/18 1158 08/30/18 1655 08/30/18 2219 08/31/18 0821  GLUCAP 194* 206* 221* 181* 204*  Lipid Profile: No results for input(s): CHOL, HDL, LDLCALC, TRIG, CHOLHDL, LDLDIRECT in the last 72 hours. Thyroid Function Tests: No results for input(s): TSH, T4TOTAL, FREET4, T3FREE, THYROIDAB in the last 72 hours. Anemia Panel: Recent Labs    08/29/18 0448  FERRITIN 3,803*   Urine analysis:    Component Value Date/Time   COLORURINE YELLOW (A) 08/23/2018 1209   APPEARANCEUR CLOUDY (A) 08/23/2018 1209   APPEARANCEUR Hazy 07/23/2013 0741   LABSPEC 1.016 08/23/2018 1209   LABSPEC 1.016 07/23/2013 0741   PHURINE 5.0 08/23/2018 1209   GLUCOSEU NEGATIVE 08/23/2018 1209   GLUCOSEU Negative 07/23/2013 0741   HGBUR SMALL (A) 08/23/2018 1209   BILIRUBINUR NEGATIVE 08/23/2018 1209   BILIRUBINUR Negative 07/23/2013 0741   KETONESUR NEGATIVE 08/23/2018 1209   PROTEINUR 100 (A) 08/23/2018 1209   UROBILINOGEN 0.2 06/30/2013 2213   NITRITE NEGATIVE 08/23/2018 1209   LEUKOCYTESUR SMALL  (A) 08/23/2018 1209   LEUKOCYTESUR Negative 07/23/2013 0741   Sepsis Labs: @LABRCNTIP (procalcitonin:4,lacticidven:4)  ) Recent Results (from the past 240 hour(s))  SARS Coronavirus 2 (CEPHEID - Performed in [place] hospital lab), Hosp Order     Status: Abnormal   Collection Time: 08/23/18  9:12 AM   Specimen: Nasopharyngeal Swab  Result Value Ref Range Status   SARS Coronavirus 2 POSITIVE (A) NEGATIVE Final    Comment: RESULT CALLED TO, READ BACK BY AND VERIFIED WITH: KIM GAULT AT 1117 08/23/2018.PMF (NOTE) If result is NEGATIVE SARS-CoV-2 target nucleic acids are NOT DETECTED. The SARS-CoV-2 RNA is generally detectable in upper and lower  respiratory specimens during the acute phase of infection. The lowest  concentration of SARS-CoV-2 viral copies this assay can detect is 250  copies / mL. A negative result does not preclude SARS-CoV-2 infection  and should not be used as the sole basis for treatment or other  patient management decisions.  A negative result may occur with  improper specimen collection / handling, submission of specimen other  than nasopharyngeal swab, presence of viral mutation(s) within the  areas targeted by this assay, and inadequate number of viral copies  (<250 copies / mL). A negative result must be combined with clinical  observations, patient history, and epidemiological information. If result is POSITIVE SARS-CoV-2 target nucleic acids are DETECTED. The S ARS-CoV-2 RNA is generally detectable in upper and lower  respiratory specimens during the acute phase of infection.  Positive  results are indicative of active infection with SARS-CoV-2.  Clinical  correlation with patient history and other diagnostic information is  necessary to determine patient infection status.  Positive results do  not rule out bacterial infection or co-infection with other viruses. If result is PRESUMPTIVE POSTIVE SARS-CoV-2 nucleic acids MAY BE PRESENT.   A presumptive  positive result was obtained on the submitted specimen  and confirmed on repeat testing.  While 2019 novel coronavirus  (SARS-CoV-2) nucleic acids may be present in the submitted sample  additional confirmatory testing may be necessary for epidemiological  and / or clinical management purposes  to differentiate between  SARS-CoV-2 and other Sarbecovirus currently known to infect humans.  If clinically indicated additional testing with an alternate test  methodology [phone]) is adv ised. The SARS-CoV-2 RNA is generally  detectable in upper and lower respiratory specimens during the acute  phase of infection. The expected result is Negative. Fact Sheet for Patients:  StrictlyIdeas.no Fact Sheet for Healthcare Providers: BankingDealers.co.za This test is not yet approved or cleared by the [place] FDA and has been authorized for  detection and/or diagnosis of SARS-CoV-2 by FDA under an Emergency Use Authorization (EUA).  This EUA will remain in effect (meaning this test can be used) for the duration of the COVID-19 declaration under Section 564(b)(1) of the Act, 21 U.S.C. section 360bbb-3(b)(1), unless the authorization is terminated or revoked sooner. Performed at [facility], [address] [place], [place] [address]   Culture, blood (routine x 2)     Status: None (Preliminary result)   Collection Time: 08/23/18  9:13 AM   Specimen: BLOOD  Result Value Ref Range Status   Specimen Description BLOOD L WRIST  Final   Special Requests   Final    BOTTLES DRAWN AEROBIC AND ANAEROBIC Blood Culture results may not be optimal due to an inadequate volume of blood received in culture bottles   Culture   Final    NO GROWTH 4 DAYS Performed at [facility], [address] [place], [place] [address]    Report Status PENDING  Incomplete  Culture, blood (routine x 2)     Status: None (Preliminary result)   Collection Time:  08/23/18  9:13 AM   Specimen: BLOOD  Result Value Ref Range Status   Specimen Description BLOOD L HAND  Final   Special Requests   Final    BOTTLES DRAWN AEROBIC AND ANAEROBIC Blood Culture results may not be optimal due to an inadequate volume of blood received in culture bottles   Culture   Final    NO GROWTH 4 DAYS Performed at [facility], [address], [place], [place] [address]    Report Status PENDING  Incomplete  Urine culture     Status: None   Collection Time: 08/23/18 12:09 PM   Specimen: Urine, Random  Result Value Ref Range Status   Specimen Description   Final    URINE, RANDOM Performed at [facility], [address] [place], [place] [address]    Special Requests   Final    NONE Performed at [facility], [address], [place], [place] [address]    Culture   Final    NO GROWTH Performed at [facility], [place] [address] [place], [place] [address]    Report Status 08/25/2018 FINAL  Final  MRSA PCR Screening     Status: None   Collection Time: 08/23/18 11:45 PM   Specimen: Nasopharyngeal  Result Value Ref Range Status   MRSA by PCR NEGATIVE NEGATIVE Final    Comment:        The GeneXpert MRSA Assay (FDA approved for NASAL specimens only), is one component of a comprehensive MRSA colonization surveillance program. It is not intended to diagnose MRSA infection nor to guide or monitor treatment for MRSA infections. Performed at [facility], [place] [address] [place], [place] [address]   SARS Coronavirus 2 [facility] order, Performed in [place] hospital lab)     Status: Abnormal   Collection Time: 08/30/18  5:51 AM   Specimen: Nasopharyngeal Swab  Result Value Ref Range Status   SARS Coronavirus 2 POSITIVE (A) NEGATIVE Final    Comment: CRITICAL RESULT CALLED TO, READ BACK BY AND VERIFIED WITH: RN Alphia Moh [id number] AT 262 AM BY CM (NOTE) If result is NEGATIVE SARS-CoV-2 target nucleic acids are NOT  DETECTED. The SARS-CoV-2 RNA is generally detectable in upper and lower  respiratory specimens during the acute phase of infection. The lowest  concentration of SARS-CoV-2 viral copies this assay can detect is 250  copies / mL. A negative result does not preclude SARS-CoV-2 infection  and should not be used as the sole basis for treatment or other  patient management decisions.  A negative result may occur with  improper specimen collection / handling, submission of specimen other  than nasopharyngeal swab, presence of viral mutation(s) within the  areas targeted by this assay, and inadequate number of viral copies  (<250 copies / mL). A negative result must be combined with clinical  observations, patient history, and epidemiological information. If result is POSITIVE SARS-CoV-2 target nucleic acids are DE TECTED. The SARS-CoV-2 RNA is generally detectable in upper and lower  respiratory specimens during the acute phase of infection.  Positive  results are indicative of active infection with SARS-CoV-2.  Clinical  correlation with patient history and other diagnostic information is  necessary to determine patient infection status.  Positive results do  not rule out bacterial infection or co-infection with other viruses. If result is PRESUMPTIVE POSTIVE SARS-CoV-2 nucleic acids MAY BE PRESENT.   A presumptive positive result was obtained on the submitted specimen  and confirmed on repeat testing.  While 2019 novel coronavirus  (SARS-CoV-2) nucleic acids may be present in the submitted sample  additional confirmatory testing may be necessary for epidemiological  and / or clinical management purposes  to differentiate between  SARS-CoV-2 and other Sarbecovirus currently known to infect humans.  If clinically indicated additional testing with an alternate test  methodology (L [id number]) is advised. The SARS-CoV-2 RNA is generally  detectable in upper and lower respiratory specimens during  the acute  phase of infection. The expected result is Negative. Fact Sheet for Patients:  StrictlyIdeas.no Fact Sheet for Healthcare Providers: BankingDealers.co.za This test is not yet approved or cleared by the [place] FDA and has been authorized for detection and/or diagnosis of SARS-CoV-2 by FDA under an Emergency Use Authorization (EUA).  This EUA will remain in effect (meaning this test can be used) for the duration of the COVID-19 declaration under Section 564(b)(1) of the Act, 21 U.S.C. section 360bbb-3(b)(1), unless the authorization is terminated or revoked sooner. Performed at [facility], [place] [address], [place], [place] [address]       Studies: No results found.  Scheduled Meds: . amiodarone  400 mg Oral BID  . aspirin EC  81 mg Oral Daily  . atorvastatin  80 mg Oral q1800  . calcium acetate  2,001 mg Oral TID WC  . Chlorhexidine Gluconate Cloth  6 each Topical Q0600  . darbepoetin (ARANESP) injection - DIALYSIS  60 mcg Intravenous Q Wed-HD  . feeding supplement (NEPRO CARB STEADY)  237 mL Oral BID BM  . ferrous sulfate  325 mg Oral Q breakfast  . heparin  5,000 Units Subcutaneous Q8H  . insulin aspart  0-5 Units Subcutaneous QHS  . insulin aspart  0-9 Units Subcutaneous TID WC  . insulin aspart  3 Units Subcutaneous TID WC  . insulin glargine  45 Units Subcutaneous Daily  . levothyroxine  50 mcg Oral Q0600  . mouth rinse  15 mL Mouth Rinse BID  . methylPREDNISolone (SOLU-MEDROL) injection  50 mg Intravenous Q12H  . midodrine  10 mg Oral BID PC  . mirtazapine  7.5 mg Oral QHS  . multivitamin  1 tablet Oral QHS  . pantoprazole  40 mg Oral Daily  . polyethylene glycol  17 g Oral Daily  . vitamin C  500 mg Oral Daily  . zinc sulfate  220 mg Oral  Daily    Continuous Infusions: . sodium chloride 100 mL (08/27/18 0519)  . sodium chloride       LOS: 8 days     Kayleen Memos, MD [facility] Pager [phone]  If 7PM-7AM, please contact night-coverage [email or web address] Password [email or web address] 08/31/2018, 12:00 PM

## 2018-08-31 NOTE — Plan of Care (Signed)
Labs returned with K 5.7 - earlier results hemolyzed  Kayexalate once now  Will plan for HD tomorrow per MWF schedule.  Requesting first shift   Harrie Jeans, MD

## 2018-09-01 LAB — CULTURE, BLOOD (ROUTINE X 2): Culture: NO GROWTH

## 2018-09-01 LAB — RENAL FUNCTION PANEL
Albumin: 2.6 g/dL — ABNORMAL LOW (ref 3.5–5.0)
Anion gap: 20 — ABNORMAL HIGH (ref 5–15)
BUN: 104 mg/dL — ABNORMAL HIGH (ref 8–23)
CO2: 24 mmol/L (ref 22–32)
Calcium: 8.9 mg/dL (ref 8.9–10.3)
Chloride: 92 mmol/L — ABNORMAL LOW (ref 98–111)
Creatinine, Ser: 10.92 mg/dL — ABNORMAL HIGH (ref 0.44–1.00)
GFR calc Af Amer: 4 mL/min — ABNORMAL LOW (ref >60)
GFR calc non Af Amer: 3 mL/min — ABNORMAL LOW (ref >60)
Glucose, Bld: 230 mg/dL — ABNORMAL HIGH (ref 70–99)
Phosphorus: 7 mg/dL — ABNORMAL HIGH (ref 2.5–4.6)
Potassium: 5.7 mmol/L — ABNORMAL HIGH (ref 3.5–5.1)
Sodium: 136 mmol/L (ref 135–145)

## 2018-09-01 LAB — CBC
HCT: 30.8 % — ABNORMAL LOW (ref 36.0–46.0)
Hemoglobin: 10.3 g/dL — ABNORMAL LOW (ref 12.0–15.0)
MCH: 32.4 pg (ref 26.0–34.0)
MCHC: 33.4 g/dL (ref 30.0–36.0)
MCV: 96.9 fL (ref 80.0–100.0)
Platelets: 262 10*3/uL (ref 150–400)
RBC: 3.18 MIL/uL — ABNORMAL LOW (ref 3.87–5.11)
RDW: 14.1 % (ref 11.5–15.5)
WBC: 11 10*3/uL — ABNORMAL HIGH (ref 4.0–10.5)
nRBC: 1.1 % — ABNORMAL HIGH (ref 0.0–0.2)

## 2018-09-01 LAB — GLUCOSE, CAPILLARY
Glucose-Capillary: 203 mg/dL — ABNORMAL HIGH (ref 70–99)
Glucose-Capillary: 319 mg/dL — ABNORMAL HIGH (ref 70–99)
Glucose-Capillary: 193 mg/dL — ABNORMAL HIGH (ref 70–99)
Glucose-Capillary: 120 mg/dL — ABNORMAL HIGH (ref 70–99)

## 2018-09-01 LAB — LACTATE DEHYDROGENASE: LDH: 244 U/L — ABNORMAL HIGH (ref 98–192)

## 2018-09-01 MED ORDER — TRAZODONE HCL 50 MG PO TABS: 50.0000 mg | ORAL_TABLET | Freq: Every evening | ORAL | Status: DC | PRN

## 2018-09-01 MED ORDER — INSULIN ASPART 100 UNIT/ML ~~LOC~~ SOLN
5.0000 [IU] | Freq: Three times a day (TID) | SUBCUTANEOUS | Status: DC
  Administered 2018-09-01 – 2018-09-06 (×12): 5 [IU] via SUBCUTANEOUS

## 2018-09-01 MED ORDER — CHLORHEXIDINE GLUCONATE CLOTH 2 % EX PADS: 6.0000 | MEDICATED_PAD | Freq: Every day | CUTANEOUS | Status: DC

## 2018-09-01 MED ORDER — FLUOXETINE HCL 20 MG PO CAPS
20.0000 mg | ORAL_CAPSULE | Freq: Every day | ORAL | Status: DC
  Administered 2018-09-01 – 2018-09-11 (×11): 20 mg via ORAL
  Filled 2018-09-01 (×12): qty 1

## 2018-09-01 NOTE — Progress Notes (Addendum)
PROGRESS NOTE  Jamie Arias LZJ:[id number] DOB: April 23, 1955 DOA: 08/23/2018 PCP: [name], MD  HPI/Recap of past 24 hours: 63 y.o.femalewith medical history significant ofESRD on HDMWF,anemia, chronic diastolic congestive heart failure, CAD status post PCI, type 2 diabetes, hypertension, hyperlipidemia, hypothyroidism, and conditions listed belowpresenting as a transfer from [facility]. COVID-19positive. Patient transferred to [facility] for management of COVID-19 and hemodialysis.Patient states her last dialysis was on Monday, June 15 and since then she has not been able to go because she is not feeling well. Reports having generalized weakness. Reports having intermittent tremors for the past few months. Denies family history of tremors. Denies any fevers, chills, chest pain, shortness of breath, cough, nausea, vomiting, abdominal pain, diarrhea, dysuria, or urinary frequency/urgency. No additional history could be obtained from her.  Reports depression. Not on antidepressant. Psych consulted.  09/01/18: Patient seen and examined at bedside.  No new complaints.  Medications change as recommended by psychiatry.  Received 2 hours of hemodialysis yesterday due to hyperkalemia.  Hemodialysis planned today.    Assessment/Plan: Principal Problem:   COVID-19 virus infection Active Problems:   HTN (hypertension)   ESRD (end stage renal disease) (HCC)   Acute on chronic respiratory failure with hypoxia (HCC)   UTI (urinary tract infection)   Adjustment disorder with depressed mood  COVID-19 viral pneumonia Presented with a fever Still requiring O2 supplementation to maintain O2 saturation greater than 92% Continue inhalers and IV steroids Continue supportive treatments 1st covid 19 + on 08/23/18 Repeat covid 19 + in house + on 08/30/18 Inflammatory markers are trending down Afebrile LDH 244 from 410 yesterday  Hyperkalemia in the setting of ESRD on HD MWF  Received hemodialysis yesterday for 2 hours Dialysis planned again today. Potassium this morning 5.7, will be corrected with dialysis  Anorexia Had started on Remeron DC'd on 09/01/2018 per psych recommendation Encourage oral protein calorie intake  Newly diagnosed atrial fibrillation Continue amiodarone by cardiology Chads Vascor 5 Defer to cardiology for decision on anticoagulation Hold 2D echo per cardiology She follows with cardiology outpatient Dr. Jacqulyn Cane from [place].  Depression Not on antidepressant at home Psychiatry recommended to DC Remeron and start Prozac 20 mg daily and trazodone as needed nightly 50 mg Will need to follow-up with a psychiatrist outpatient.  Physical debility/generalized weakness/debilitation from PT OT to reassess Fall precautions  Resolved headache/neck pain Last CT head 08/23/2018 showed no bony abnormalities from the base of the skull No acute intracranial findings  Hypothyroidism TSH is low Free T4 upper end of normal Reduce dose of levothyroxine  Acute hypoxic respiratory failure likely secondary to acute COVID-19 related pneumonia Presented with hypoxia with O2 saturation 85% on room air Improving Continue management as stated above Continue to maintain O2 saturation greater than 92%  Resolved hypotension in the setting of ESRD on dialysis Continue midodrine Maintain map greater than 65  ESRD on HD Monday Wednesday Friday Nephrology consulted and following Plan for hemodialysis today 09/01/2018 Continue to closely monitor blood pressure  Intractable nausea with no vomiting IV Zofran PRN 3 times daily  Chronic lower back pain P.o. oxycodone for severe pain.  Added bowel regimen to avoid constipation Tylenol for mild to moderate pain  Anemia of chronic disease likely secondary to ESRD Hemoglobin stable at 9 No sign of active bleeding  Type 2 diabetes with hyperglycemia Last hemoglobin A1c 7.1 on  08/16/17 Continue insulin sliding scale Obtain hemoglobin A1c  Physical deconditioning -PT/OT consulted with recommendation for  CIR but patient tested positive again. -Asked to have a negative COVID-19 test prior to transfer to CIR. -PT OT asked to reevaluate -Continue PT OT -Fall precautions  DVT prophylaxis: Heparin subq 3 times daily Code Status: Full Family Communication:  Will call if okay with the patient. Disposition Plan:  Undetermined, PT/OT will need to reassess on 08/31/2018.  Consultants:   Nephrology  Psychiatry  Procedures:     Antimicrobials:             Anti-infectives (From admission, onward)       Start        Dose/Rate  Route  Frequency  Ordered  Stop     08/25/18 1000    cefTRIAXone (ROCEPHIN) 1 g in sodium chloride 0.9 % 100 mL IVPB       1 g  200 mL/hr over 30 Minutes  Intravenous  Every 24 hours  08/24/18 0022        08/24/18 0500    azithromycin (ZITHROMAX) 500 mg in sodium chloride 0.9 % 250 mL IVPB  Status:  Discontinued       500 mg  250 mL/hr over 60 Minutes  Intravenous  Every 24 hours  08/24/18 0453  08/24/18 0454          Objective: Vitals:   08/31/18 1700 08/31/18 2000 09/01/18 0437 09/01/18 0805  BP: (!) 124/43 (!) 136/47 (!) 124/53 (!) 146/57  Pulse:  (!) 57    Resp: 16  14 16   Temp: 97.6 F (36.4 C)  97.6 F (36.4 C) 98.1 F (36.7 C)  TempSrc: Oral  Oral Oral  SpO2: 100% 95% 99% 100%  Weight:      Height:        Intake/Output Summary (Last 24 hours) at 09/01/2018 1034 Last data filed at 08/31/2018 1800 Gross per 24 hour  Intake 360 ml  Output 0 ml  Net 360 ml   Filed Weights   08/27/18 1616 08/29/18 0850 08/29/18 1150  Weight: 94 kg 94.4 kg 94 kg    Exam:  . General: 63 y.o. year-old female likely ill-appearing no acute distress.  Alert and oriented x4.  .  Cardiovascular: Irregular rate and rhythm with no rubs or gallops.  No JVD or thyromegaly.   Marland Kitchen Respiratory: Clear  to auscultation with no wheezes or rales.  Poor inspiratory effort.  .  Abdomen: Nontender nondistended normal bowel sounds. . Musculoskeletal: No lower extremity edema.  2 out of 4 pulses.  Marland Kitchen  Psychiatry: Flat affect.   Data Reviewed: CBC: Recent Labs  Lab 08/28/18 0548 09/01/18 0648  WBC 10.6* 11.0*  HGB 9.9* 10.3*  HCT 30.2* 30.8*  MCV 97.4 96.9  PLT 234 630   Basic Metabolic Panel: Recent Labs  Lab 08/28/18 0524 08/29/18 0448 08/30/18 0604 08/31/18 0655 08/31/18 1547 09/01/18 0648  NA 135 136 136 135 133* 136  K 4.9 5.6* 4.9 6.4* 5.7* 5.7*  CL 95* 95* 94* 92* 93* 92*  CO2 24 23 25 24 24 24   GLUCOSE 208* 211* 185* 221* 278* 230*  BUN 41* 67* 50* 77* 88* 104*  CREATININE 7.65* 9.72* 7.26* 9.27* 9.84* 10.92*  CALCIUM 8.8* 9.0 8.8* 8.9 9.5 8.9  MG 2.2  --   --   --   --   --   PHOS 5.7* 6.0* 5.6* 6.8*  --  7.0*   GFR: Estimated Creatinine Clearance: 5.8 mL/min (A) (by C-G formula based on SCr of 10.92 mg/dL (H)). Liver Function Tests: Recent Labs  Lab 08/28/18 0524 08/29/18 0448 08/30/18 0604 08/31/18 0655 09/01/18 0648  ALBUMIN 2.8* 2.6* 2.5* 2.5* 2.6*   No results for input(s): LIPASE, AMYLASE in the last 168 hours. No results for input(s): AMMONIA in the last 168 hours. Coagulation Profile: No results for input(s): INR, PROTIME in the last 168 hours. Cardiac Enzymes: No results for input(s): CKTOTAL, CKMB, CKMBINDEX, TROPONINI in the last 168 hours. BNP (last 3 results) No results for input(s): PROBNP in the last 8760 hours. HbA1C: No results for input(s): HGBA1C in the last 72 hours. CBG: Recent Labs  Lab 08/31/18 0821 08/31/18 1245 08/31/18 1743 08/31/18 2246 09/01/18 0803  GLUCAP 204* 288* 247* 265* 203*   Lipid Profile: No results for input(s): CHOL, HDL, LDLCALC, TRIG, CHOLHDL, LDLDIRECT in the last 72 hours. Thyroid Function Tests: No results for input(s): TSH, T4TOTAL, FREET4, T3FREE, THYROIDAB in the last 72 hours. Anemia Panel: No  results for input(s): VITAMINB12, FOLATE, FERRITIN, TIBC, IRON, RETICCTPCT in the last 72 hours. Urine analysis:    Component Value Date/Time   COLORURINE YELLOW (A) 08/23/2018 1209   APPEARANCEUR CLOUDY (A) 08/23/2018 1209   APPEARANCEUR Hazy 07/23/2013 0741   LABSPEC 1.016 08/23/2018 1209   LABSPEC 1.016 07/23/2013 0741   PHURINE 5.0 08/23/2018 1209   GLUCOSEU NEGATIVE 08/23/2018 1209   GLUCOSEU Negative 07/23/2013 0741   HGBUR SMALL (A) 08/23/2018 1209   BILIRUBINUR NEGATIVE 08/23/2018 1209   BILIRUBINUR Negative 07/23/2013 0741   KETONESUR NEGATIVE 08/23/2018 1209   PROTEINUR 100 (A) 08/23/2018 1209   UROBILINOGEN 0.2 06/30/2013 2213   NITRITE NEGATIVE 08/23/2018 1209   LEUKOCYTESUR SMALL (A) 08/23/2018 1209   LEUKOCYTESUR Negative 07/23/2013 0741   Sepsis Labs: @LABRCNTIP (procalcitonin:4,lacticidven:4)  ) Recent Results (from the past 240 hour(s))  SARS Coronavirus 2 (CEPHEID - Performed in [place] hospital lab), Hosp Order     Status: Abnormal   Collection Time: 08/23/18  9:12 AM   Specimen: Nasopharyngeal Swab  Result Value Ref Range Status   SARS Coronavirus 2 POSITIVE (A) NEGATIVE Final    Comment: RESULT CALLED TO, READ BACK BY AND VERIFIED WITH: KIM GAULT AT 1117 08/23/2018.PMF (NOTE) If result is NEGATIVE SARS-CoV-2 target nucleic acids are NOT DETECTED. The SARS-CoV-2 RNA is generally detectable in upper and lower  respiratory specimens during the acute phase of infection. The lowest  concentration of SARS-CoV-2 viral copies this assay can detect is 250  copies / mL. A negative result does not preclude SARS-CoV-2 infection  and should not be used as the sole basis for treatment or other  patient management decisions.  A negative result may occur with  improper specimen collection / handling, submission of specimen other  than nasopharyngeal swab, presence of viral mutation(s) within the  areas targeted by this assay, and inadequate number of viral copies   (<250 copies / mL). A negative result must be combined with clinical  observations, patient history, and epidemiological information. If result is POSITIVE SARS-CoV-2 target nucleic acids are DETECTED. The S ARS-CoV-2 RNA is generally detectable in upper and lower  respiratory specimens during the acute phase of infection.  Positive  results are indicative of active infection with SARS-CoV-2.  Clinical  correlation with patient history and other diagnostic information is  necessary to determine patient infection status.  Positive results do  not rule out bacterial infection or co-infection with other viruses. If result is PRESUMPTIVE POSTIVE SARS-CoV-2 nucleic acids MAY BE PRESENT.   A presumptive positive result was obtained on the submitted specimen  and  confirmed on repeat testing.  While 2019 novel coronavirus  (SARS-CoV-2) nucleic acids may be present in the submitted sample  additional confirmatory testing may be necessary for epidemiological  and / or clinical management purposes  to differentiate between  SARS-CoV-2 and other Sarbecovirus currently known to infect humans.  If clinically indicated additional testing with an alternate test  methodology [phone]) is adv ised. The SARS-CoV-2 RNA is generally  detectable in upper and lower respiratory specimens during the acute  phase of infection. The expected result is Negative. Fact Sheet for Patients:  StrictlyIdeas.no Fact Sheet for Healthcare Providers: BankingDealers.co.za This test is not yet approved or cleared by the [place] FDA and has been authorized for detection and/or diagnosis of SARS-CoV-2 by FDA under an Emergency Use Authorization (EUA).  This EUA will remain in effect (meaning this test can be used) for the duration of the COVID-19 declaration under Section 564(b)(1) of the Act, 21 U.S.C. section 360bbb-3(b)(1), unless the authorization is terminated or  revoked sooner. Performed at [facility], [address] [place], [place] [address]   Culture, blood (routine x 2)     Status: None (Preliminary result)   Collection Time: 08/23/18  9:13 AM   Specimen: BLOOD  Result Value Ref Range Status   Specimen Description BLOOD L WRIST  Final   Special Requests   Final    BOTTLES DRAWN AEROBIC AND ANAEROBIC Blood Culture results may not be optimal due to an inadequate volume of blood received in culture bottles   Culture   Final    NO GROWTH 4 DAYS Performed at [facility], [address] [place], [place] [address]    Report Status PENDING  Incomplete  Culture, blood (routine x 2)     Status: None (Preliminary result)   Collection Time: 08/23/18  9:13 AM   Specimen: BLOOD  Result Value Ref Range Status   Specimen Description BLOOD L HAND  Final   Special Requests   Final    BOTTLES DRAWN AEROBIC AND ANAEROBIC Blood Culture results may not be optimal due to an inadequate volume of blood received in culture bottles   Culture   Final    NO GROWTH 4 DAYS Performed at [facility], [address], [place], [place] [address]    Report Status PENDING  Incomplete  Urine culture     Status: None   Collection Time: 08/23/18 12:09 PM   Specimen: Urine, Random  Result Value Ref Range Status   Specimen Description   Final    URINE, RANDOM Performed at [facility], [address], [place], [place] [address]    Special Requests   Final    NONE Performed at [facility], [address], [place], [place] [address]    Culture   Final    NO GROWTH Performed at [facility], [place] [address] [place], [place] [address]    Report Status 08/25/2018 FINAL  Final  MRSA PCR Screening     Status: None   Collection Time: 08/23/18 11:45 PM   Specimen: Nasopharyngeal  Result Value Ref Range Status   MRSA by PCR NEGATIVE NEGATIVE Final    Comment:        The GeneXpert MRSA Assay (FDA approved for  NASAL specimens only), is one component of a comprehensive MRSA colonization surveillance program. It is not intended to diagnose MRSA infection nor to guide or monitor treatment for MRSA infections. Performed at [facility], [address] [place], [place] [address]   SARS Coronavirus 2 [facility] order, Performed in [facility] hospital lab)     Status: Abnormal   Collection Time: 08/30/18  5:51 AM   Specimen: Nasopharyngeal Swab  Result Value Ref Range Status   SARS Coronavirus 2 POSITIVE (A) NEGATIVE Final    Comment: CRITICAL RESULT CALLED TO, READ BACK BY AND VERIFIED WITH: RN Alphia Moh [id number] AT 403 AM BY CM (NOTE) If result is NEGATIVE SARS-CoV-2 target nucleic acids are NOT DETECTED. The SARS-CoV-2 RNA is generally detectable in upper and lower  respiratory specimens during the acute phase of infection. The lowest  concentration of SARS-CoV-2 viral copies this assay can detect is 250  copies / mL. A negative result does not preclude SARS-CoV-2 infection  and should not be used as the sole basis for treatment or other  patient management decisions.  A negative result may occur with  improper specimen collection / handling, submission of specimen other  than nasopharyngeal swab, presence of viral mutation(s) within the  areas targeted by this assay, and inadequate number of viral copies  (<250 copies / mL). A negative result must be combined with clinical  observations, patient history, and epidemiological information. If result is POSITIVE SARS-CoV-2 target nucleic acids are DE TECTED. The SARS-CoV-2 RNA is generally detectable in upper and lower  respiratory specimens during the acute phase of infection.  Positive  results are indicative of active infection with SARS-CoV-2.  Clinical  correlation with patient history and other diagnostic information is  necessary to determine patient infection status.  Positive results do  not rule out bacterial infection or  co-infection with other viruses. If result is PRESUMPTIVE POSTIVE SARS-CoV-2 nucleic acids MAY BE PRESENT.   A presumptive positive result was obtained on the submitted specimen  and confirmed on repeat testing.  While 2019 novel coronavirus  (SARS-CoV-2) nucleic acids may be present in the submitted sample  additional confirmatory testing may be necessary for epidemiological  and / or clinical management purposes  to differentiate between  SARS-CoV-2 and other Sarbecovirus currently known to infect humans.  If clinically indicated additional testing with an alternate test  methodology (L [id number]) is advised. The SARS-CoV-2 RNA is generally  detectable in upper and lower respiratory specimens during the acute  phase of infection. The expected result is Negative. Fact Sheet for Patients:  StrictlyIdeas.no Fact Sheet for Healthcare Providers: BankingDealers.co.za This test is not yet approved or cleared by the [place] FDA and has been authorized for detection and/or diagnosis of SARS-CoV-2 by FDA under an Emergency Use Authorization (EUA).  This EUA will remain in effect (meaning this test can be used) for the duration of the COVID-19 declaration under Section 564(b)(1) of the Act, 21 U.S.C. section 360bbb-3(b)(1), unless the authorization is terminated or revoked sooner. Performed at [facility], [place] [address] [place], [place] [address]       Studies: No results found.  Scheduled Meds: . amiodarone  400 mg Oral BID  . aspirin EC  81 mg Oral Daily  . atorvastatin  80 mg Oral q1800  . calcium acetate  2,001 mg Oral TID WC  . Chlorhexidine Gluconate Cloth  6 each Topical Q0600  . darbepoetin (ARANESP) injection - DIALYSIS  60 mcg Intravenous Q Wed-HD  . feeding supplement (NEPRO CARB STEADY)  237 mL Oral BID BM  . ferrous sulfate  325 mg Oral Q breakfast  . FLUoxetine  20 mg Oral Daily  . heparin  5,000 Units  Subcutaneous Q8H  . insulin aspart  0-5 Units Subcutaneous QHS  . insulin aspart  0-9 Units Subcutaneous TID WC  . insulin aspart  5 Units Subcutaneous TID WC  . insulin glargine  45 Units Subcutaneous Daily  . levothyroxine  50 mcg Oral Q0600  . mouth rinse  15 mL Mouth Rinse BID  . methylPREDNISolone (SOLU-MEDROL) injection  50 mg Intravenous Q12H  . midodrine  10 mg Oral BID PC  . multivitamin  1 tablet Oral QHS  . pantoprazole  40 mg Oral Daily  . polyethylene glycol  17 g Oral Daily  . vitamin C  500 mg Oral Daily  . zinc sulfate  220 mg Oral Daily    Continuous Infusions: . sodium chloride 100 mL (08/27/18 0519)  . sodium chloride       LOS: 9 days     Kayleen Memos, MD [facility] Pager [phone]  If 7PM-7AM, please contact night-coverage [email or web address] Password [facility] 09/01/2018, 10:34 AM

## 2018-09-01 NOTE — Care Management Important Message (Signed)
Important Message  Patient Details  Name: Jamie Arias MRN: [id number] Date of Birth: 09-20-1955   Medicare Important Message Given:  Yes     Zenon Mayo, RN 09/01/2018, 2:22 PM

## 2018-09-01 NOTE — Progress Notes (Signed)
Physical Therapy Treatment Patient Details Name: Jamie Arias MRN: [id number] DOB: [birth year] Today's Date: 09/01/2018    History of Present Illness Jamie Arias is a 63 y.o. female with medical history significant of ESRD on HD MWF, anemia, chronic diastolic congestive heart failure, CAD status post PCI, type 2 diabetes, hypertension, hyperlipidemia, hypothyroidism, and conditions listed below presenting as a transfer from [facility].  COVID-19 positive.  Patient transferred to [facility] for management of COVID-19 and hemodialysis.     PT Comments    Pt awake, but staring into space.  Will respond during these staring spells.  A and O x4, but then later confused about her sister's name.  Pt reported being too lightheaded EOB to attempt standing and wanted to return to supine. VSS throughout on 2 L O2 [place].  PT will continue to follow acutely for safe mobility progression   Follow Up Recommendations  CIR     Equipment Recommendations  Rolling walker with 5" wheels;3in1 (PT)    Recommendations for Other Services   NA     Precautions / Restrictions Precautions Precautions: Fall Precaution Comments: no tremors today, only staring episodes Restrictions Weight Bearing Restrictions: No    Mobility  Bed Mobility Overal bed mobility: Needs Assistance Bed Mobility: Supine to Sit;Sit to Supine     Supine to sit: Mod assist;HOB elevated Sit to supine: Mod assist;HOB elevated   General bed mobility comments: Mod assist to support trunk to come to sitting EOB.  Pt with posterior right preferenc during transitions.   Transfers                 General transfer comment: unable pt reported she was too lightheaded and needed to lay back down.          Balance Overall balance assessment: Needs assistance Sitting-balance support: Feet supported;Bilateral upper extremity supported Sitting balance-Leahy Scale: Zero Sitting balance - Comments: max assist in sitting to maintain  sitting balance EOB encouraging anterior weight shfit and left lateral lean Postural control: Right lateral lean;Posterior lean                                  Cognition Arousal/Alertness: Awake/alert Behavior During Therapy: Flat affect Overall Cognitive Status: Impaired/Different from baseline Area of Impairment: Attention;Following commands;Awareness;Problem solving                   Current Attention Level: Sustained Memory: Decreased short-term memory Following Commands: Follows one step commands inconsistently   Awareness: Intellectual Problem Solving: Slow processing;Decreased initiation General Comments: Pt alert, but stares into space, not making good eye contact, will respond when asked and A and O x 4, but then confused about her sister's name.              Pertinent Vitals/Pain Pain Assessment: No/denies pain Pain Score: 0-No pain           PT Goals (current goals can now be found in the care plan section) Acute Rehab PT Goals Patient Stated Goal: none stated today Progress towards PT goals: Not progressing toward goals - comment    Frequency    Min 3X/week      PT Plan Current plan remains appropriate       AM-PAC PT "6 Clicks" Mobility   Outcome Measure  Help needed turning from your back to your side while in a flat bed without using bedrails?: A Lot Help needed moving  from lying on your back to sitting on the side of a flat bed without using bedrails?: A Lot Help needed moving to and from a bed to a chair (including a wheelchair)?: A Lot Help needed standing up from a chair using your arms (e.g., wheelchair or bedside chair)?: Total Help needed to walk in hospital room?: Total Help needed climbing 3-5 steps with a railing? : Total 6 Click Score: 9    End of Session Equipment Utilized During Treatment: Oxygen(2L O2 [place]) Activity Tolerance: Patient limited by fatigue Patient left: in bed;with bed alarm set;with call  bell/phone within reach Nurse Communication: Other (comment)(RN in room during session) PT Visit Diagnosis: Unsteadiness on feet (R26.81);Other abnormalities of gait and mobility (R26.89);Other symptoms and signs involving the nervous system (R29.898);Dizziness and giddiness (R42);History of falling (Z91.81)     Time: 2778-2423 PT Time Calculation (min) (ACUTE ONLY): 35 min  Charges:  $Therapeutic Activity: 23-37 mins                    Lawayne Hartig B. Joakim Huesman, PT, DPT  Acute Rehabilitation [phone] pager [phone] office  @ [facility]: [phone]   09/01/2018, 6:05 PM

## 2018-09-01 NOTE — Progress Notes (Signed)
Paged ordering Dr several times regarding EEG order; left message with nurse that COVID-19 patients require neuro consult prior to EEG.

## 2018-09-01 NOTE — Progress Notes (Signed)
Inpatient Diabetes Program Recommendations  AACE/ADA: New Consensus Statement on Inpatient Glycemic Control (2015)  Target Ranges:  Prepandial:   less than 140 mg/dL      Peak postprandial:   less than 180 mg/dL (1-2 hours)      Critically ill patients:  140 - 180 mg/dL   Results for Jamie Arias, Jamie Arias (MRN [id number]) as of 09/01/2018 10:20  Ref. Range 08/31/2018 08:21 08/31/2018 12:45 08/31/2018 17:43 08/31/2018 22:46 09/01/2018 08:03  Glucose-Capillary Latest Ref Range: 70 - 99 mg/dL 204 (H) 288 (H) 247 (H) 265 (H) 203 (H)   Review of Glycemic Control  Diabetes history: DM 2 Outpatient Diabetes medications: Basaglar 40 units Daily  Current orders for Inpatient glycemic control: Lantus 45 units Daily, Novolog 0-9 units tid, Novolog 0-5 units qhs, Novolog 3 units tid meal coverage  Inpatient Diabetes Program Recommendations:     Glucose consistently in the 200 range. Patient receiving IV Solumedrol 50 mg Q12 hours.  Consider increasing Lantus to 50 units. Consider increasing meal coverage to Novolog 5 units tid.  Thanks,  Tama Headings RN, MSN, BC-ADM Inpatient Diabetes Coordinator Team Pager [phone] (8a-5p)

## 2018-09-01 NOTE — Progress Notes (Signed)
[facility] NEPHROLOGY PROGRESS NOTE  Assessment/ Plan: Pt is a 63 y.o. yo female with ESRD on HD MWF transferred from [place] for generalized weakness secondary due to COVID-19.  # ESRD: Received 2 hours of dialysis yesterday for hyperkalemia.  Regular maintenance dialysis today.  On midodrine.  Per prior chart, the outpatient [facility] has been arranged [facility], MWF third shift if is still COVID positive when she is medically stable to discharge.  # Anemia of chronic kidney disease: Continue Aranesp.  Hemoglobin acceptable.  # Secondary hyperparathyroidism: Phosphorus elevated.  Continue PhosLo.  Patient has poor oral intake.  # HTN/volume: On midodrine for hypotension.  Today blood pressure charted acceptable range.  #COVID-19 positive: Per primary team, on  steroid  #Altered mental status lethargy per nursing staff: Minimizing sedatives.  Primary team is following and seen by psychiatrist for the depression  Subjective:  Due to the nature of this patient's suspected COVID-19 with isolation and in keeping with efforts to prevent the spread of infection and to conserve personal protective equipment, a physical exam was not personally performed.  Patient's symptoms and exam were discussed in detail with the RN.  A chart review of other providers notes and the patient's lab work as well as review of other pertinent studies was performed.  Exam details from prior documentation were reviewed specifically and confirmed with the bedside nurse.  Location of service: [name] Objective Vital signs in last 24 hours: Vitals:   08/31/18 1700 08/31/18 2000 09/01/18 0437 09/01/18 0805  BP: (!) 124/43 (!) 136/47 (!) 124/53 (!) 146/57  Pulse:  (!) 57    Resp: 16  14 16   Temp: 97.6 F (36.4 C)  97.6 F (36.4 C) 98.1 F (36.7 C)  TempSrc: Oral  Oral Oral  SpO2: 100% 95% 99% 100%  Weight:      Height:       Weight change:   Intake/Output Summary (Last 24 hours) at  09/01/2018 1142 Last data filed at 08/31/2018 1800 Gross per 24 hour  Intake 360 ml  Output 0 ml  Net 360 ml       Labs: Basic Metabolic Panel: Recent Labs  Lab 08/30/18 0604 08/31/18 0655 08/31/18 1547 09/01/18 0648  NA 136 135 133* 136  K 4.9 6.4* 5.7* 5.7*  CL 94* 92* 93* 92*  CO2 25 24 24 24   GLUCOSE 185* 221* 278* 230*  BUN 50* 77* 88* 104*  CREATININE 7.26* 9.27* 9.84* 10.92*  CALCIUM 8.8* 8.9 9.5 8.9  PHOS 5.6* 6.8*  --  7.0*   Liver Function Tests: Recent Labs  Lab 08/30/18 0604 08/31/18 0655 09/01/18 0648  ALBUMIN 2.5* 2.5* 2.6*   No results for input(s): LIPASE, AMYLASE in the last 168 hours. No results for input(s): AMMONIA in the last 168 hours. CBC: Recent Labs  Lab 08/28/18 0548 09/01/18 0648  WBC 10.6* 11.0*  HGB 9.9* 10.3*  HCT 30.2* 30.8*  MCV 97.4 96.9  PLT 234 262   Cardiac Enzymes: No results for input(s): CKTOTAL, CKMB, CKMBINDEX, TROPONINI in the last 168 hours. CBG: Recent Labs  Lab 08/31/18 0821 08/31/18 1245 08/31/18 1743 08/31/18 2246 09/01/18 0803  GLUCAP 204* 288* 247* 265* 203*    Iron Studies: No results for input(s): IRON, TIBC, TRANSFERRIN, FERRITIN in the last 72 hours. Studies/Results: No results found.  Medications: Infusions: . sodium chloride 100 mL (08/27/18 0519)  . sodium chloride      Scheduled Medications: . amiodarone  400 mg Oral  BID  . aspirin EC  81 mg Oral Daily  . atorvastatin  80 mg Oral q1800  . calcium acetate  2,001 mg Oral TID WC  . Chlorhexidine Gluconate Cloth  6 each Topical Q0600  . darbepoetin (ARANESP) injection - DIALYSIS  60 mcg Intravenous Q Wed-HD  . feeding supplement (NEPRO CARB STEADY)  237 mL Oral BID BM  . ferrous sulfate  325 mg Oral Q breakfast  . FLUoxetine  20 mg Oral Daily  . heparin  5,000 Units Subcutaneous Q8H  . insulin aspart  0-5 Units Subcutaneous QHS  . insulin aspart  0-9 Units Subcutaneous TID WC  . insulin aspart  5 Units Subcutaneous TID WC  .  insulin glargine  45 Units Subcutaneous Daily  . levothyroxine  50 mcg Oral Q0600  . mouth rinse  15 mL Mouth Rinse BID  . methylPREDNISolone (SOLU-MEDROL) injection  50 mg Intravenous Q12H  . midodrine  10 mg Oral BID PC  . multivitamin  1 tablet Oral QHS  . pantoprazole  40 mg Oral Daily  . polyethylene glycol  17 g Oral Daily  . vitamin C  500 mg Oral Daily  . zinc sulfate  220 mg Oral Daily    have reviewed scheduled and prn medications.   Cisco Kindt Prasad Adriene Knipfer 09/01/2018,11:42 AM  LOS: 9 days  Pager: [phone]

## 2018-09-01 NOTE — Progress Notes (Signed)
This RN called pt spouse, [name], to update on status.

## 2018-09-01 NOTE — Plan of Care (Signed)
  Problem: Clinical Measurements: Goal: Respiratory complications will improve Outcome: Progressing   Problem: Activity: Goal: Risk for activity intolerance will decrease Outcome: Progressing   Problem: Coping: Goal: Level of anxiety will decrease Outcome: Progressing   Problem: Pain Managment: Goal: General experience of comfort will improve Outcome: Progressing   Problem: Skin Integrity: Goal: Risk for impaired skin integrity will decrease Outcome: Progressing

## 2018-09-02 DIAGNOSIS — N186 End stage renal disease: Secondary | ICD-10-CM | POA: Diagnosis not present

## 2018-09-02 DIAGNOSIS — Z992 Dependence on renal dialysis: Secondary | ICD-10-CM | POA: Diagnosis not present

## 2018-09-02 LAB — GLUCOSE, CAPILLARY
Glucose-Capillary: 135 mg/dL — ABNORMAL HIGH (ref 70–99)
Glucose-Capillary: 170 mg/dL — ABNORMAL HIGH (ref 70–99)
Glucose-Capillary: 123 mg/dL — ABNORMAL HIGH (ref 70–99)
Glucose-Capillary: 160 mg/dL — ABNORMAL HIGH (ref 70–99)

## 2018-09-02 LAB — RENAL FUNCTION PANEL
Albumin: 2.8 g/dL — ABNORMAL LOW (ref 3.5–5.0)
Anion gap: 15 (ref 5–15)
BUN: 53 mg/dL — ABNORMAL HIGH (ref 8–23)
CO2: 26 mmol/L (ref 22–32)
Calcium: 8.9 mg/dL (ref 8.9–10.3)
Chloride: 95 mmol/L — ABNORMAL LOW (ref 98–111)
Creatinine, Ser: 7.2 mg/dL — ABNORMAL HIGH (ref 0.44–1.00)
GFR calc Af Amer: 6 mL/min — ABNORMAL LOW (ref >60)
GFR calc non Af Amer: 5 mL/min — ABNORMAL LOW (ref >60)
Glucose, Bld: 122 mg/dL — ABNORMAL HIGH (ref 70–99)
Phosphorus: 5.6 mg/dL — ABNORMAL HIGH (ref 2.5–4.6)
Potassium: 5 mmol/L (ref 3.5–5.1)
Sodium: 136 mmol/L (ref 135–145)

## 2018-09-02 LAB — CULTURE, BLOOD (ROUTINE X 2): Culture: NO GROWTH

## 2018-09-02 LAB — LACTATE DEHYDROGENASE: LDH: 257 U/L — ABNORMAL HIGH (ref 98–192)

## 2018-09-02 MED ORDER — CHLORHEXIDINE GLUCONATE CLOTH 2 % EX PADS
6.0000 | MEDICATED_PAD | Freq: Every day | CUTANEOUS | Status: DC
  Administered 2018-09-03 – 2018-09-05 (×3): 6 via TOPICAL

## 2018-09-02 NOTE — Progress Notes (Addendum)
[facility] NEPHROLOGY PROGRESS NOTE  Assessment/ Plan: Pt is a 63 y.o. yo female with ESRD on HD MWF transferred from [place] for generalized weakness secondary due to COVID-19.  # ESRD MWF:  -Had dialysis yesterday with around 1.5 kg UF, tolerated well.  Plan for next treatment tomorrow. On midodrine.  Per prior chart, the outpatient [facility] has been [facility] MWF third shift if is still COVID positive when she is medically stable to discharge.  # Anemia of chronic kidney disease: Continue Aranesp.  Hemoglobin acceptable.  # Secondary hyperparathyroidism: Phosphorus level is improving.  Continue PhosLo.  Patient has poor oral intake.  # HTN/volume: On midodrine for hypotension.  Today blood pressure charted acceptable range.  #COVID-19 positive: Per primary team, on  steroid  #Altered mental status lethargy per nursing staff: Minimizing sedatives.  Primary team is following and seen by psychiatrist for the depression.  Discussed with the patient's nurse in detail.  Subjective:  Due to the nature of this patient's suspected COVID-19 with isolation and in keeping with efforts to prevent the spread of infection and to conserve personal protective equipment, a physical exam was not personally performed.  Patient's symptoms and exam were discussed in detail with the RN.  A chart review of other providers notes and the patient's lab work as well as review of other pertinent studies was performed.  Exam details from prior documentation were reviewed specifically and confirmed with the bedside nurse.  Location of service: [name] Objective Vital signs in last 24 hours: Vitals:   09/01/18 2139 09/01/18 2206 09/02/18 0543 09/02/18 0800  BP: (!) 130/56 (!) 139/54 (!) 126/43 (!) 130/48  Pulse: 60 (!) 59 (!) 52 (!) 54  Resp: 20 18 16 16   Temp: 98 F (36.7 C) 97.6 F (36.4 C) 98.2 F (36.8 C) (!) 97.4 F (36.3 C)  TempSrc: Oral Oral Oral Oral  SpO2:  100% 100% 100% 100%  Weight: 92.5 kg     Height:       Weight change:   Intake/Output Summary (Last 24 hours) at 09/02/2018 0841 Last data filed at 09/01/2018 2139 Gross per 24 hour  Intake -  Output 1500 ml  Net -1500 ml       Labs: Basic Metabolic Panel: Recent Labs  Lab 08/31/18 0655 08/31/18 1547 09/01/18 0648 09/02/18 0444  NA 135 133* 136 136  K 6.4* 5.7* 5.7* 5.0  CL 92* 93* 92* 95*  CO2 24 24 24 26   GLUCOSE 221* 278* 230* 122*  BUN 77* 88* 104* 53*  CREATININE 9.27* 9.84* 10.92* 7.20*  CALCIUM 8.9 9.5 8.9 8.9  PHOS 6.8*  --  7.0* 5.6*   Liver Function Tests: Recent Labs  Lab 08/31/18 0655 09/01/18 0648 09/02/18 0444  ALBUMIN 2.5* 2.6* 2.8*   No results for input(s): LIPASE, AMYLASE in the last 168 hours. No results for input(s): AMMONIA in the last 168 hours. CBC: Recent Labs  Lab 08/28/18 0548 09/01/18 0648  WBC 10.6* 11.0*  HGB 9.9* 10.3*  HCT 30.2* 30.8*  MCV 97.4 96.9  PLT 234 262   Cardiac Enzymes: No results for input(s): CKTOTAL, CKMB, CKMBINDEX, TROPONINI in the last 168 hours. CBG: Recent Labs  Lab 08/31/18 2246 09/01/18 0803 09/01/18 1217 09/01/18 1717 09/01/18 2205  GLUCAP 265* 203* 319* 193* 120*    Iron Studies: No results for input(s): IRON, TIBC, TRANSFERRIN, FERRITIN in the last 72 hours. Studies/Results: No results found.  Medications: Infusions: . sodium chloride 100  mL (08/27/18 0519)  . sodium chloride      Scheduled Medications: . amiodarone  400 mg Oral BID  . aspirin EC  81 mg Oral Daily  . atorvastatin  80 mg Oral q1800  . calcium acetate  2,001 mg Oral TID WC  . Chlorhexidine Gluconate Cloth  6 each Topical Q0600  . darbepoetin (ARANESP) injection - DIALYSIS  60 mcg Intravenous Q Wed-HD  . feeding supplement (NEPRO CARB STEADY)  237 mL Oral BID BM  . FLUoxetine  20 mg Oral Daily  . heparin  5,000 Units Subcutaneous Q8H  . insulin aspart  0-5 Units Subcutaneous QHS  . insulin aspart  0-9 Units  Subcutaneous TID WC  . insulin aspart  5 Units Subcutaneous TID WC  . insulin glargine  45 Units Subcutaneous Daily  . levothyroxine  50 mcg Oral Q0600  . mouth rinse  15 mL Mouth Rinse BID  . methylPREDNISolone (SOLU-MEDROL) injection  50 mg Intravenous Q12H  . midodrine  10 mg Oral BID PC  . multivitamin  1 tablet Oral QHS  . pantoprazole  40 mg Oral Daily  . polyethylene glycol  17 g Oral Daily  . vitamin C  500 mg Oral Daily  . zinc sulfate  220 mg Oral Daily    have reviewed scheduled and prn medications.   Dron Prasad Bhandari 09/02/2018,8:41 AM  LOS: 10 days  Pager: [phone]

## 2018-09-02 NOTE — Progress Notes (Signed)
Pt spoke to family on phone and updated.

## 2018-09-02 NOTE — Progress Notes (Signed)
PROGRESS NOTE  Jamie Arias:[id number] DOB: [birth year] DOA: 08/23/2018 PCP: [name], MD  HPI/Recap of past 24 hours: 63 y.o.femalewith medical history significant ofESRD on HDMWF,anemia, chronic diastolic congestive heart failure, CAD status post PCI, type 2 diabetes, hypertension, hyperlipidemia, hypothyroidism, and conditions listed belowpresenting as a transfer from [facility]. COVID-19positive. Patient transferred to [facility] for management of COVID-19 and hemodialysis.Patient states her last dialysis was on Monday, June 15 and since then she has not been able to go because she is not feeling well. Reports having generalized weakness. Reports having intermittent tremors for the past few months. Denies family history of tremors. Denies any fevers, chills, chest pain, shortness of breath, cough, nausea, vomiting, abdominal pain, diarrhea, dysuria, or urinary frequency/urgency. No additional history could be obtained from her.  Reports depression. Not on antidepressant. Psych consulted.  Appreciate recommendations.  Intermittent hyperkalemia improved with HD.  09/02/18: Patient seen and examined at bedside.  She states she feels better today but not quite back to her baseline.  States she still feels weak.  Completed HD yesterday without complication.  Plan for next HD tomorrow.    Assessment/Plan: Principal Problem:   COVID-19 virus infection Active Problems:   HTN (hypertension)   ESRD (end stage renal disease) (HCC)   Acute on chronic respiratory failure with hypoxia (HCC)   UTI (urinary tract infection)   Adjustment disorder with depressed mood  COVID-19 viral pneumonia Presented with a fever Still requiring O2 supplementation to maintain O2 saturation greater than 92% Continue inhalers and IV steroids Continue vitamin C, zinc Continue supportive treatments 1st covid 19 + on 08/23/18 Repeat covid 19 + in house + on 08/30/18 Continue to trend  inflammatory markers. She is afebrile O2 saturation 100% on 2 L  Hyperkalemia in the setting of ESRD on HD MWF Completed hemodialysis on 09/01/2018 Plan for hemodialysis tomorrow 09/03/2018 Continue to monitor electrolytes Managed by nephrology  Anorexia Had started on Remeron DC'd on 09/01/2018 per psych recommendation Continue to encourage increasing oral protein calorie intake.    Newly diagnosed atrial fibrillation Continue amiodarone by cardiology Chads Vascor 5 Defer to cardiology for decision on anticoagulation Hold 2D echo per cardiology She follows with cardiology outpatient Dr. Jacqulyn Cane from [place].  Depression Not on antidepressant at home Psychiatry recommended to DC Remeron and start Prozac 20 mg daily and trazodone as needed nightly 50 mg Will need to follow-up with a psychiatrist outpatient.  Please arrange prior to discharge.  Persistent generalized weakness/physical debility PT OT assessed and recommended CIR Unable to be admitted to CIR due to positive COVID-19 She will need to have a negative COVID-19 in order to be considered for CIR placement  Resolved headache/neck pain Last CT head 08/23/2018 showed no bony abnormalities from the base of the skull No acute intracranial findings  Hypothyroidism TSH is low Free T4 upper end of normal Reduced dose of levothyroxine 2 g daily We will need to repeat TSH in 4 to 6 weeks  Acute hypoxic respiratory failure likely secondary to acute COVID-19 related pneumonia Presented with hypoxia with O2 saturation 85% on room air Improving Continue management as stated above Continue to maintain O2 saturation greater than 92% O2 saturation is improved 100% on 2 L by nasal cannula  Resolved hypotension in the setting of ESRD on dialysis Continue Midodrine Maintain map greater than 65  ESRD on HD Monday Wednesday Friday Nephrology consulted and following Last hemodialysis was on 09/01/2018 Plan  for hemodialysis on  09/03/2018  Intractable nausea with no vomiting IV Zofran PRN 3 times daily  Chronic lower back pain P.o. oxycodone for severe pain as needed.  Added bowel regimen to avoid constipation Tylenol for mild to moderate pain  Anemia of chronic disease likely secondary to ESRD Hemoglobin stable at 9 No sign of active bleeding  Type 2 diabetes with hyperglycemia Last hemoglobin A1c 7.1 on 08/16/17 Continue insulin sliding scale Obtain hemoglobin A1c   DVT prophylaxis: Heparin subq 3 times daily Code Status: Full Family Communication:  Will call if okay with the patient. Disposition Plan:  Possible CIR once COVID-19 test is negative.  Consultants:   Nephrology  Psychiatry  Procedures:     Antimicrobials:             Anti-infectives (From admission, onward)       Start        Dose/Rate  Route  Frequency  Ordered  Stop     08/25/18 1000    cefTRIAXone (ROCEPHIN) 1 g in sodium chloride 0.9 % 100 mL IVPB       1 g  200 mL/hr over 30 Minutes  Intravenous  Every 24 hours  08/24/18 0022        08/24/18 0500    azithromycin (ZITHROMAX) 500 mg in sodium chloride 0.9 % 250 mL IVPB  Status:  Discontinued       500 mg  250 mL/hr over 60 Minutes  Intravenous  Every 24 hours  08/24/18 0453  08/24/18 0454          Objective: Vitals:   09/01/18 2139 09/01/18 2206 09/02/18 0543 09/02/18 0800  BP: (!) 130/56 (!) 139/54 (!) 126/43 (!) 130/48  Pulse: 60 (!) 59 (!) 52 (!) 54  Resp: 20 18 16 16   Temp: 98 F (36.7 C) 97.6 F (36.4 C) 98.2 F (36.8 C) (!) 97.4 F (36.3 C)  TempSrc: Oral Oral Oral Oral  SpO2: 100% 100% 100% 100%  Weight: 92.5 kg     Height:        Intake/Output Summary (Last 24 hours) at 09/02/2018 0859 Last data filed at 09/01/2018 2139 Gross per 24 hour  Intake -  Output 1500 ml  Net -1500 ml   Filed Weights   08/29/18 0850 08/29/18 1150 09/01/18 2139  Weight: 94.4 kg 94 kg 92.5 kg    Exam:   . General: 63 y.o. year-old female chronically ill-appearing in no acute distress.  Alert and oriented x4.  .  Cardiovascular: Irregular rate and rhythm with no rubs or gallops.  No JVD or thyromegaly.   Marland Kitchen Respiratory: Clear to auscultation no wheezes or rales.  Poor inspiratory effort. . Abdomen: Obese nontender with normal bowel sounds.   . Musculoskeletal: No lower extremity edema.  2 out of 4 pulses in all 4 extremities. Marland Kitchen Psychiatry: Flat affect.  Slightly improved from yesterday.  Data Reviewed: CBC: Recent Labs  Lab 08/28/18 0548 09/01/18 0648  WBC 10.6* 11.0*  HGB 9.9* 10.3*  HCT 30.2* 30.8*  MCV 97.4 96.9  PLT 234 209   Basic Metabolic Panel: Recent Labs  Lab 08/28/18 0524 08/29/18 0448 08/30/18 0604 08/31/18 0655 08/31/18 1547 09/01/18 0648 09/02/18 0444  NA 135 136 136 135 133* 136 136  K 4.9 5.6* 4.9 6.4* 5.7* 5.7* 5.0  CL 95* 95* 94* 92* 93* 92* 95*  CO2 24 23 25 24 24 24 26   GLUCOSE 208* 211* 185* 221* 278* 230* 122*  BUN 41* 67* 50* 77* 88* 104* 53*  CREATININE 7.65* 9.72* 7.26* 9.27* 9.84* 10.92* 7.20*  CALCIUM 8.8* 9.0 8.8* 8.9 9.5 8.9 8.9  MG 2.2  --   --   --   --   --   --   PHOS 5.7* 6.0* 5.6* 6.8*  --  7.0* 5.6*   GFR: Estimated Creatinine Clearance: 8.7 mL/min (A) (by C-G formula based on SCr of 7.2 mg/dL (H)). Liver Function Tests: Recent Labs  Lab 08/29/18 0448 08/30/18 0604 08/31/18 0655 09/01/18 0648 09/02/18 0444  ALBUMIN 2.6* 2.5* 2.5* 2.6* 2.8*   No results for input(s): LIPASE, AMYLASE in the last 168 hours. No results for input(s): AMMONIA in the last 168 hours. Coagulation Profile: No results for input(s): INR, PROTIME in the last 168 hours. Cardiac Enzymes: No results for input(s): CKTOTAL, CKMB, CKMBINDEX, TROPONINI in the last 168 hours. BNP (last 3 results) No results for input(s): PROBNP in the last 8760 hours. HbA1C: No results for input(s): HGBA1C in the last 72 hours. CBG: Recent Labs  Lab 09/01/18 0803  09/01/18 1217 09/01/18 1717 09/01/18 2205 09/02/18 0822  GLUCAP 203* 319* 193* 120* 135*   Lipid Profile: No results for input(s): CHOL, HDL, LDLCALC, TRIG, CHOLHDL, LDLDIRECT in the last 72 hours. Thyroid Function Tests: No results for input(s): TSH, T4TOTAL, FREET4, T3FREE, THYROIDAB in the last 72 hours. Anemia Panel: No results for input(s): VITAMINB12, FOLATE, FERRITIN, TIBC, IRON, RETICCTPCT in the last 72 hours. Urine analysis:    Component Value Date/Time   COLORURINE YELLOW (A) 08/23/2018 1209   APPEARANCEUR CLOUDY (A) 08/23/2018 1209   APPEARANCEUR Hazy 07/23/2013 0741   LABSPEC 1.016 08/23/2018 1209   LABSPEC 1.016 07/23/2013 0741   PHURINE 5.0 08/23/2018 1209   GLUCOSEU NEGATIVE 08/23/2018 1209   GLUCOSEU Negative 07/23/2013 0741   HGBUR SMALL (A) 08/23/2018 1209   BILIRUBINUR NEGATIVE 08/23/2018 1209   BILIRUBINUR Negative 07/23/2013 0741   KETONESUR NEGATIVE 08/23/2018 1209   PROTEINUR 100 (A) 08/23/2018 1209   UROBILINOGEN 0.2 06/30/2013 2213   NITRITE NEGATIVE 08/23/2018 1209   LEUKOCYTESUR SMALL (A) 08/23/2018 1209   LEUKOCYTESUR Negative 07/23/2013 0741   Sepsis Labs: @LABRCNTIP (procalcitonin:4,lacticidven:4)  ) Recent Results (from the past 240 hour(s))  SARS Coronavirus 2 (CEPHEID - Performed in [place] hospital lab), Hosp Order     Status: Abnormal   Collection Time: 08/23/18  9:12 AM   Specimen: Nasopharyngeal Swab  Result Value Ref Range Status   SARS Coronavirus 2 POSITIVE (A) NEGATIVE Final    Comment: RESULT CALLED TO, READ BACK BY AND VERIFIED WITH: KIM GAULT AT 1117 08/23/2018.PMF (NOTE) If result is NEGATIVE SARS-CoV-2 target nucleic acids are NOT DETECTED. The SARS-CoV-2 RNA is generally detectable in upper and lower  respiratory specimens during the acute phase of infection. The lowest  concentration of SARS-CoV-2 viral copies this assay can detect is 250  copies / mL. A negative result does not preclude SARS-CoV-2 infection  and  should not be used as the sole basis for treatment or other  patient management decisions.  A negative result may occur with  improper specimen collection / handling, submission of specimen other  than nasopharyngeal swab, presence of viral mutation(s) within the  areas targeted by this assay, and inadequate number of viral copies  (<250 copies / mL). A negative result must be combined with clinical  observations, patient history, and epidemiological information. If result is POSITIVE SARS-CoV-2 target nucleic acids are DETECTED. The S ARS-CoV-2 RNA is generally detectable in upper and lower  respiratory specimens during the  acute phase of infection.  Positive  results are indicative of active infection with SARS-CoV-2.  Clinical  correlation with patient history and other diagnostic information is  necessary to determine patient infection status.  Positive results do  not rule out bacterial infection or co-infection with other viruses. If result is PRESUMPTIVE POSTIVE SARS-CoV-2 nucleic acids MAY BE PRESENT.   A presumptive positive result was obtained on the submitted specimen  and confirmed on repeat testing.  While 2019 novel coronavirus  (SARS-CoV-2) nucleic acids may be present in the submitted sample  additional confirmatory testing may be necessary for epidemiological  and / or clinical management purposes  to differentiate between  SARS-CoV-2 and other Sarbecovirus currently known to infect humans.  If clinically indicated additional testing with an alternate test  methodology [phone]) is adv ised. The SARS-CoV-2 RNA is generally  detectable in upper and lower respiratory specimens during the acute  phase of infection. The expected result is Negative. Fact Sheet for Patients:  StrictlyIdeas.no Fact Sheet for Healthcare Providers: BankingDealers.co.za This test is not yet approved or cleared by the [place] FDA and has been  authorized for detection and/or diagnosis of SARS-CoV-2 by FDA under an Emergency Use Authorization (EUA).  This EUA will remain in effect (meaning this test can be used) for the duration of the COVID-19 declaration under Section 564(b)(1) of the Act, 21 U.S.C. section 360bbb-3(b)(1), unless the authorization is terminated or revoked sooner. Performed at [facility], [address] [place], [place] [address]   Culture, blood (routine x 2)     Status: None   Collection Time: 08/23/18  9:13 AM   Specimen: BLOOD  Result Value Ref Range Status   Specimen Description BLOOD L WRIST  Final   Special Requests   Final    BOTTLES DRAWN AEROBIC AND ANAEROBIC Blood Culture results may not be optimal due to an inadequate volume of blood received in culture bottles   Culture   Final    NO GROWTH 5 DAYS Performed at [facility], [address] [place], [place] [address]    Report Status 09/01/2018 FINAL  Final  Culture, blood (routine x 2)     Status: None (Preliminary result)   Collection Time: 08/23/18  9:13 AM   Specimen: BLOOD  Result Value Ref Range Status   Specimen Description BLOOD L HAND  Final   Special Requests   Final    BOTTLES DRAWN AEROBIC AND ANAEROBIC Blood Culture results may not be optimal due to an inadequate volume of blood received in culture bottles   Culture   Final    NO GROWTH 4 DAYS Performed at [facility], [address], [place], [place] [address]    Report Status PENDING  Incomplete  Urine culture     Status: None   Collection Time: 08/23/18 12:09 PM   Specimen: Urine, Random  Result Value Ref Range Status   Specimen Description   Final    URINE, RANDOM Performed at [facility], [address] [place], [place] [address]    Special Requests   Final    NONE Performed at [facility], [address], [place], [place] [address]    Culture   Final    NO GROWTH Performed at [facility], [place] [address], [place], [place] [address]    Report Status 08/25/2018 FINAL  Final  MRSA PCR Screening     Status: None   Collection Time: 08/23/18 11:45 PM  Specimen: Nasopharyngeal  Result Value Ref Range Status   MRSA by PCR NEGATIVE NEGATIVE Final    Comment:        The GeneXpert MRSA Assay (FDA approved for NASAL specimens only), is one component of a comprehensive MRSA colonization surveillance program. It is not intended to diagnose MRSA infection nor to guide or monitor treatment for MRSA infections. Performed at [facility], [place] [address], [place], [place] [address]   SARS Coronavirus 2 [facility] order, Performed in [place] hospital lab)     Status: Abnormal   Collection Time: 08/30/18  5:51 AM   Specimen: Nasopharyngeal Swab  Result Value Ref Range Status   SARS Coronavirus 2 POSITIVE (A) NEGATIVE Final    Comment: CRITICAL RESULT CALLED TO, READ BACK BY AND VERIFIED WITH: RN Alphia Moh [id number] AT 626 AM BY CM (NOTE) If result is NEGATIVE SARS-CoV-2 target nucleic acids are NOT DETECTED. The SARS-CoV-2 RNA is generally detectable in upper and lower  respiratory specimens during the acute phase of infection. The lowest  concentration of SARS-CoV-2 viral copies this assay can detect is 250  copies / mL. A negative result does not preclude SARS-CoV-2 infection  and should not be used as the sole basis for treatment or other  patient management decisions.  A negative result may occur with  improper specimen collection / handling, submission of specimen other  than nasopharyngeal swab, presence of viral mutation(s) within the  areas targeted by this assay, and inadequate number of viral copies  (<250 copies / mL). A negative result must be combined with clinical  observations, patient history, and epidemiological information. If result is POSITIVE SARS-CoV-2 target nucleic acids are DE TECTED. The SARS-CoV-2 RNA is generally detectable in upper and lower   respiratory specimens during the acute phase of infection.  Positive  results are indicative of active infection with SARS-CoV-2.  Clinical  correlation with patient history and other diagnostic information is  necessary to determine patient infection status.  Positive results do  not rule out bacterial infection or co-infection with other viruses. If result is PRESUMPTIVE POSTIVE SARS-CoV-2 nucleic acids MAY BE PRESENT.   A presumptive positive result was obtained on the submitted specimen  and confirmed on repeat testing.  While 2019 novel coronavirus  (SARS-CoV-2) nucleic acids may be present in the submitted sample  additional confirmatory testing may be necessary for epidemiological  and / or clinical management purposes  to differentiate between  SARS-CoV-2 and other Sarbecovirus currently known to infect humans.  If clinically indicated additional testing with an alternate test  methodology (L [phone]) is advised. The SARS-CoV-2 RNA is generally  detectable in upper and lower respiratory specimens during the acute  phase of infection. The expected result is Negative. Fact Sheet for Patients:  StrictlyIdeas.no Fact Sheet for Healthcare Providers: BankingDealers.co.za This test is not yet approved or cleared by the [place] FDA and has been authorized for detection and/or diagnosis of SARS-CoV-2 by FDA under an Emergency Use Authorization (EUA).  This EUA will remain in effect (meaning this test can be used) for the duration of the COVID-19 declaration under Section 564(b)(1) of the Act, 21 U.S.C. section 360bbb-3(b)(1), unless the authorization is terminated or revoked sooner. Performed at [facility], [place] [address] [place], [place] [address]       Studies: No results found.  Scheduled Meds: . amiodarone  400 mg Oral BID  . aspirin EC  81 mg Oral Daily  . atorvastatin  80 mg Oral q1800  . calcium acetate   2,001 mg Oral TID WC  . Chlorhexidine Gluconate Cloth  6 each Topical Q0600  . Chlorhexidine Gluconate Cloth  6 each Topical Q0600  . darbepoetin (ARANESP) injection - DIALYSIS  60 mcg Intravenous Q Wed-HD  . feeding supplement (NEPRO CARB STEADY)  237 mL Oral BID BM  . FLUoxetine  20 mg Oral Daily  . heparin  5,000 Units Subcutaneous Q8H  . insulin aspart  0-5 Units Subcutaneous QHS  . insulin aspart  0-9 Units Subcutaneous TID WC  . insulin aspart  5 Units Subcutaneous TID WC  . insulin glargine  45 Units Subcutaneous Daily  . levothyroxine  50 mcg Oral Q0600  . mouth rinse  15 mL Mouth Rinse BID  . methylPREDNISolone (SOLU-MEDROL) injection  50 mg Intravenous Q12H  . midodrine  10 mg Oral BID PC  . multivitamin  1 tablet Oral QHS  . pantoprazole  40 mg Oral Daily  . polyethylene glycol  17 g Oral Daily  . vitamin C  500 mg Oral Daily  . zinc sulfate  220 mg Oral Daily    Continuous Infusions: . sodium chloride 100 mL (08/27/18 0519)  . sodium chloride       LOS: 10 days     Kayleen Memos, MD [facility] Pager [phone]  If 7PM-7AM, please contact night-coverage [email or web address] Password [email or web address] 09/02/2018, 8:59 AM

## 2018-09-03 LAB — CBC
HCT: 35.3 % — ABNORMAL LOW (ref 36.0–46.0)
Hemoglobin: 11.7 g/dL — ABNORMAL LOW (ref 12.0–15.0)
MCH: 32.1 pg (ref 26.0–34.0)
MCHC: 33.1 g/dL (ref 30.0–36.0)
MCV: 97 fL (ref 80.0–100.0)
Platelets: 285 10*3/uL (ref 150–400)
RBC: 3.64 MIL/uL — ABNORMAL LOW (ref 3.87–5.11)
RDW: 14.1 % (ref 11.5–15.5)
WBC: 13.3 10*3/uL — ABNORMAL HIGH (ref 4.0–10.5)
nRBC: 2.2 % — ABNORMAL HIGH (ref 0.0–0.2)

## 2018-09-03 LAB — RENAL FUNCTION PANEL
Albumin: 3.1 g/dL — ABNORMAL LOW (ref 3.5–5.0)
Anion gap: 17 — ABNORMAL HIGH (ref 5–15)
BUN: 44 mg/dL — ABNORMAL HIGH (ref 8–23)
CO2: 24 mmol/L (ref 22–32)
Calcium: 8.7 mg/dL — ABNORMAL LOW (ref 8.9–10.3)
Chloride: 94 mmol/L — ABNORMAL LOW (ref 98–111)
Creatinine, Ser: 5.4 mg/dL — ABNORMAL HIGH (ref 0.44–1.00)
GFR calc Af Amer: 9 mL/min — ABNORMAL LOW (ref >60)
GFR calc non Af Amer: 8 mL/min — ABNORMAL LOW (ref >60)
Glucose, Bld: 95 mg/dL (ref 70–99)
Phosphorus: 3.3 mg/dL (ref 2.5–4.6)
Potassium: 3.8 mmol/L (ref 3.5–5.1)
Sodium: 135 mmol/L (ref 135–145)

## 2018-09-03 LAB — GLUCOSE, CAPILLARY
Glucose-Capillary: 125 mg/dL — ABNORMAL HIGH (ref 70–99)
Glucose-Capillary: 114 mg/dL — ABNORMAL HIGH (ref 70–99)
Glucose-Capillary: 90 mg/dL (ref 70–99)
Glucose-Capillary: 162 mg/dL — ABNORMAL HIGH (ref 70–99)
Glucose-Capillary: 108 mg/dL — ABNORMAL HIGH (ref 70–99)

## 2018-09-03 LAB — SARS CORONAVIRUS 2 BY RT PCR (HOSPITAL ORDER, PERFORMED IN ~~LOC~~ HOSPITAL LAB): SARS Coronavirus 2: POSITIVE — AB

## 2018-09-03 LAB — LACTATE DEHYDROGENASE: LDH: 292 U/L — ABNORMAL HIGH (ref 98–192)

## 2018-09-03 MED ORDER — ALBUMIN HUMAN 5 % IV SOLN: 25.0000 g | Freq: Once | INTRAVENOUS | Status: DC

## 2018-09-03 MED ORDER — PENTAFLUOROPROP-TETRAFLUOROETH EX AERO: 1.0000 "application " | INHALATION_SPRAY | CUTANEOUS | Status: DC | PRN

## 2018-09-03 MED ORDER — MIDODRINE HCL 5 MG PO TABS
10.0000 mg | ORAL_TABLET | Freq: Three times a day (TID) | ORAL | Status: DC
  Administered 2018-09-03 – 2018-09-15 (×34): 10 mg via ORAL
  Filled 2018-09-03 (×34): qty 2

## 2018-09-03 MED ORDER — SODIUM CHLORIDE 0.9 % IV SOLN: 100.0000 mL | INTRAVENOUS | Status: DC | PRN

## 2018-09-03 MED ORDER — LIDOCAINE HCL (PF) 1 % IJ SOLN: 5.0000 mL | INTRAMUSCULAR | Status: DC | PRN

## 2018-09-03 MED ORDER — ALTEPLASE 2 MG IJ SOLR: 2.0000 mg | Freq: Once | INTRAMUSCULAR | Status: DC | PRN

## 2018-09-03 MED ORDER — HEPARIN SODIUM (PORCINE) 1000 UNIT/ML DIALYSIS
1000.0000 [IU] | INTRAMUSCULAR | Status: DC | PRN
  Filled 2018-09-03: qty 1

## 2018-09-03 MED ORDER — LIDOCAINE-PRILOCAINE 2.5-2.5 % EX CREA
1.0000 "application " | TOPICAL_CREAM | CUTANEOUS | Status: DC | PRN
  Filled 2018-09-03: qty 5

## 2018-09-03 MED ORDER — ALBUMIN HUMAN 25 % IV SOLN
25.0000 g | Freq: Once | INTRAVENOUS | Status: DC
  Filled 2018-09-03: qty 50

## 2018-09-03 MED ORDER — METHYLPREDNISOLONE SODIUM SUCC 40 MG IJ SOLR
40.0000 mg | Freq: Two times a day (BID) | INTRAMUSCULAR | Status: DC
  Administered 2018-09-04 – 2018-09-06 (×5): 40 mg via INTRAVENOUS
  Filled 2018-09-03 (×5): qty 1

## 2018-09-03 MED ORDER — HEPARIN SODIUM (PORCINE) 1000 UNIT/ML DIALYSIS
20.0000 [IU]/kg | INTRAMUSCULAR | Status: DC | PRN
  Filled 2018-09-03: qty 2

## 2018-09-03 NOTE — Progress Notes (Signed)
[facility] NEPHROLOGY PROGRESS NOTE  Assessment/ Plan: Pt is a 63 y.o. yo female with ESRD on HD MWF transferred from [place] for generalized weakness secondary due to COVID-19.  # ESRD MWF:  -HD today.  Patient was hypotensive during dialysis.  She did not receive midodrine this a.m. therefore change the order to 3 times daily.  Order a dose of albumin.  Difficult to take off fluid with hypotension.  Discussed with the dialysis nurse and patient's bedside nurse. - Per prior chart, the outpatient dialysis has been arranged at [facility], MWF third shift if is still COVID positive when she is medically stable to discharge.  # Anemia of chronic kidney disease: Continue Aranesp.  Hemoglobin acceptable.  # Secondary hyperparathyroidism: Phosphorus level improved.  Continue PhosLo.  Patient has poor oral intake.  # HTN/volume: On midodrine for hypotension. Monitor BP.  #COVID-19 positive: Per primary team, on  steroid  #Altered mental status lethargy per nursing staff: Minimizing sedatives.  Primary team is following and seen by psychiatrist for the depression.  Discussed with the patient's nurse in detail.  Subjective:  Due to the nature of this patient's suspected COVID-19 with isolation and in keeping with efforts to prevent the spread of infection and to conserve personal protective equipment, a physical exam was not personally performed.  Patient's symptoms and exam were discussed in detail with the RN.  A chart review of other providers notes and the patient's lab work as well as review of other pertinent studies was performed.  Exam details from prior documentation were reviewed specifically and confirmed with the bedside nurse.  Location of service: [name] Objective Vital signs in last 24 hours: Vitals:   09/03/18 0757 09/03/18 0802 09/03/18 0830 09/03/18 0900  BP: (!) 130/53 (!) 145/55 (!) 95/49 (!) 78/39  Pulse: (!) 56 (!) 49 (!) 53 (!) 59  Resp:       Temp:      TempSrc:      SpO2:      Weight:      Height:       Weight change:  No intake or output data in the 24 hours ending 09/03/18 0938     Labs: Basic Metabolic Panel: Recent Labs  Lab 09/01/18 0648 09/02/18 0444 09/03/18 0849  NA 136 136 135  K 5.7* 5.0 3.8  CL 92* 95* 94*  CO2 24 26 24   GLUCOSE 230* 122* 95  BUN 104* 53* 44*  CREATININE 10.92* 7.20* 5.40*  CALCIUM 8.9 8.9 8.7*  PHOS 7.0* 5.6* 3.3   Liver Function Tests: Recent Labs  Lab 09/01/18 0648 09/02/18 0444 09/03/18 0849  ALBUMIN 2.6* 2.8* 3.1*   No results for input(s): LIPASE, AMYLASE in the last 168 hours. No results for input(s): AMMONIA in the last 168 hours. CBC: Recent Labs  Lab 08/28/18 0548 09/01/18 0648 09/03/18 0832  WBC 10.6* 11.0* 13.3*  HGB 9.9* 10.3* 11.7*  HCT 30.2* 30.8* 35.3*  MCV 97.4 96.9 97.0  PLT 234 262 285   Cardiac Enzymes: No results for input(s): CKTOTAL, CKMB, CKMBINDEX, TROPONINI in the last 168 hours. CBG: Recent Labs  Lab 09/02/18 0822 09/02/18 1243 09/02/18 1619 09/02/18 2327 09/03/18 0738  GLUCAP 135* 170* 123* 160* 125*    Iron Studies: No results for input(s): IRON, TIBC, TRANSFERRIN, FERRITIN in the last 72 hours. Studies/Results: No results found.  Medications: Infusions: . sodium chloride    . sodium chloride    . albumin human  Scheduled Medications: . amiodarone  400 mg Oral BID  . aspirin EC  81 mg Oral Daily  . atorvastatin  80 mg Oral q1800  . calcium acetate  2,001 mg Oral TID WC  . Chlorhexidine Gluconate Cloth  6 each Topical Q0600  . darbepoetin (ARANESP) injection - DIALYSIS  60 mcg Intravenous Q Wed-HD  . feeding supplement (NEPRO CARB STEADY)  237 mL Oral BID BM  . FLUoxetine  20 mg Oral Daily  . heparin  5,000 Units Subcutaneous Q8H  . insulin aspart  0-5 Units Subcutaneous QHS  . insulin aspart  0-9 Units Subcutaneous TID WC  . insulin aspart  5 Units Subcutaneous TID WC  . insulin glargine  45 Units  Subcutaneous Daily  . levothyroxine  50 mcg Oral Q0600  . mouth rinse  15 mL Mouth Rinse BID  . methylPREDNISolone (SOLU-MEDROL) injection  50 mg Intravenous Q12H  . midodrine  10 mg Oral TID WC  . multivitamin  1 tablet Oral QHS  . pantoprazole  40 mg Oral Daily  . polyethylene glycol  17 g Oral Daily  . vitamin C  500 mg Oral Daily  . zinc sulfate  220 mg Oral Daily    have reviewed scheduled and prn medications.   Dron Prasad Bhandari 09/03/2018,9:38 AM  LOS: 11 days  Pager: [phone]

## 2018-09-03 NOTE — Progress Notes (Signed)
Occupational Therapy Treatment Patient Details Name: Jamie Arias MRN: [id number] DOB: 1956-02-12 Today's Date: 09/03/2018    History of present illness Jamie Arias is a 63 y.o. female with medical history significant of ESRD on HD MWF, anemia, chronic diastolic congestive heart failure, CAD status post PCI, type 2 diabetes, hypertension, hyperlipidemia, hypothyroidism, and conditions listed below presenting as a transfer from [facility].  COVID-19 positive.  Patient transferred to [facility] for management of COVID-19 and hemodialysis.    OT comments  Pt more awake/alert this session compared to previous OT treatment session, though she continues to have episodes of staring off into space and requires redirection to bring pt's attention back to session/task at hand. Pt requiring modA for bed mobility, once balanced/positioned she was able to maintain sitting balance and perform simple grooming ADL with minguard assist. Pt requires heavy maxA for partial lateral/scoot towards [facility] prior to return to supine - returned to supine due to increased dizziness with sitting upright. BP monitored and 100/58, Pt on 3L O2 with O2 sats >92% during session. Continue to recommend post acute therapy services at time of discharge to progress pt towards PLOF. Will continue to follow acutely.   Follow Up Recommendations  CIR;SNF;Supervision/Assistance - 24 hour(CIR vs SNF)    Equipment Recommendations  3 in 1 bedside commode;Other (comment)(to be further assessed)          Precautions / Restrictions Precautions Precautions: Fall Precaution Comments: no tremors today, only staring episodes Restrictions Weight Bearing Restrictions: No       Mobility Bed Mobility Overal bed mobility: Needs Assistance Bed Mobility: Supine to Sit;Sit to Supine     Supine to sit: Mod assist;HOB elevated Sit to supine: Mod assist;HOB elevated   General bed mobility comments: Mod assist to support trunk to come to  sitting EOB.  Pt with posterior right preferenc during transitions. assist for LEs when returning to supine, pt able to minimally assist with boosting towards [facility]  Transfers Overall transfer level: Needs assistance   Transfers: Lateral/Scoot Transfers          Lateral/Scoot Transfers: Max assist General transfer comment: pt requiring heavy maxA for lateral scoot along EOB towards HOB, only able to tolerate x2 mini scoots, pt reporting increased dizziness and needing to lay back down    Balance Overall balance assessment: Needs assistance Sitting-balance support: Feet supported;Bilateral upper extremity supported Sitting balance-Leahy Scale: Fair Sitting balance - Comments: pt able to maintain sitting balance today with minguard assist once positioned; reports +dizziness in sitting                                   ADL either performed or assessed with clinical judgement   ADL Overall ADL's : Needs assistance/impaired     Grooming: Wash/dry face;Set up;Sitting;Min guard Grooming Details (indicate cue type and reason): minguard for balance EOB                             Functional mobility during ADLs: Moderate assistance;Maximal assistance General ADL Comments: pt continues to have limitations due to weakness, dizziness sitting EOB                       Cognition Arousal/Alertness: Awake/alert Behavior During Therapy: Flat affect Overall Cognitive Status: Impaired/Different from baseline Area of Impairment: Attention;Following commands;Awareness;Problem solving  Current Attention Level: Sustained Memory: Decreased short-term memory Following Commands: Follows one step commands inconsistently   Awareness: Intellectual Problem Solving: Slow processing;Decreased initiation General Comments: Pt more alert today, but stares into space, not making good eye contact, will respond when asked and A and O x 4, delayed  processing noted        Exercises Exercises: General Upper Extremity;General Lower Extremity;Other exercises General Exercises - Upper Extremity Shoulder Flexion: AROM;Both;10 reps;Seated General Exercises - Lower Extremity Ankle Circles/Pumps: AROM;Both;10 reps Long Arc Quad: AROM;Both;10 reps(modified, bed in chair position) Straight Leg Raises: AROM;Both;10 reps Other Exercises Other Exercises: chest press, AROM/AAROM bil UE x10 reps   Shoulder Instructions       General Comments      Pertinent Vitals/ Pain       Pain Assessment: No/denies pain  Home Living                                          Prior Functioning/Environment              Frequency  Min 2X/week        Progress Toward Goals  OT Goals(current goals can now be found in the care plan section)  Progress towards OT goals: Progressing toward goals  Acute Rehab OT Goals Patient Stated Goal: wants to get home to her grandson, he's coming home soon from deployment OT Goal Formulation: With patient Time For Goal Achievement: 09/08/18 Potential to Achieve Goals: Good  Plan Discharge plan remains appropriate    Co-evaluation                 AM-PAC OT "6 Clicks" Daily Activity     Outcome Measure   Help from another person eating meals?: A Little Help from another person taking care of personal grooming?: A Lot Help from another person toileting, which includes using toliet, bedpan, or urinal?: Total Help from another person bathing (including washing, rinsing, drying)?: A Lot Help from another person to put on and taking off regular upper body clothing?: A Lot Help from another person to put on and taking off regular lower body clothing?: Total 6 Click Score: 11    End of Session Equipment Utilized During Treatment: Oxygen  OT Visit Diagnosis: Muscle weakness (generalized) (M62.81);Other abnormalities of gait and mobility (R26.89)   Activity Tolerance Patient  tolerated treatment well;Patient limited by fatigue(limited due to dizziness)   Patient Left in bed;with call bell/phone within reach;with bed alarm set   Nurse Communication Mobility status        Time: 2703-5009 OT Time Calculation (min): 29 min  Charges: OT General Charges $OT Visit: 1 Visit OT Treatments $Self Care/Home Management : 8-22 mins $Therapeutic Activity: 8-22 mins  Lou Cal, OT Supplemental Rehabilitation Services Pager [phone] Office [phone]    Raymondo Band 09/03/2018, 3:57 PM

## 2018-09-03 NOTE — Progress Notes (Signed)
PROGRESS NOTE    Jamie Arias  ATF:[id number] DOB: May 21, 1955 DOA: 08/23/2018 PCP: [name], MD   Brief Narrative:   63 y.o.femalewith medical history significant ofESRD on HDMWF,anemia, chronic diastolic congestive heart failure, CAD status post PCI, type 2 diabetes, hypertension, hyperlipidemia, hypothyroidism, and conditions listed belowpresenting as a transfer [facility]. COVID-19positive. Patient transferred to [facility] for management of COVID-19 and hemodialysis.  Reports depression. Not on antidepressant. Psych consulted.  Appreciate recommendations.   Assessment & Plan:   Principal Problem:   COVID-19 virus infection Active Problems:   HTN (hypertension)   ESRD (end stage renal disease) ([place])   Acute on chronic respiratory failure with hypoxia ([facility])   UTI (urinary tract infection)   Adjustment disorder with depressed mood   Hyperkalemia secondary to end-stage renal disease Patient is on dialysis Monday Wednesday Friday. Further management as per nephrology.    Newly diagnosed atrial fibrillation Rate controlled and appreciate cardiology recommendations. Defer to cardiology for starting anticoagulation.    Depression Patient was started on Prozac 20 mg daily and trazodone. Further management as per psychiatry as outpatient.   Persistent generalized weakness/deconditioning PT OT recommended CIR Patient unable to be admitted to CIR due to positive COVID-19.   Hypothyroidism Follow-up with TSH in 4 to 6 weeks.   Intractable nausea and vomiting.  Resolved   Acute respiratory failure secondary to COVID-19 related pneumonia Patient continues to require 2 L of nasal cannula oxygen to keep oxygen sats greater than 95%. Slowly taper steroids.   Type 2 diabetes with hyperglycemia Continue with sliding scale insulin  DVT prophylaxis: (Heparin Code Status: Full code Family Communication: None at bedside, will call her son  and update  Disposition Plan: Pending COVID 19- testing.  Consultants:   Psychiatry, nephrology  Procedures: None  Antimicrobials: None  Subjective: Patient reports being weak wants to work with therapy.  She denies any chest pain shortness of breath, nausea vomiting or abdominal pain.  Objective: Vitals:   09/03/18 0945 09/03/18 1000 09/03/18 1030 09/03/18 1107  BP: (!) 96/42 (!) 106/35 (!) 92/29 (!) 128/53  Pulse: 60 64 61 (!) 58  Resp:      Temp:    (!) 97.4 F (36.3 C)  TempSrc:    Axillary  SpO2:    100%  Weight:    92 kg  Height:        Intake/Output Summary (Last 24 hours) at 09/03/2018 1546 Last data filed at 09/03/2018 1058 Gross per 24 hour  Intake -  Output 507 ml  Net -507 ml   Filed Weights   09/01/18 2139 09/03/18 0710 09/03/18 1107  Weight: 92.5 kg 92.5 kg 92 kg    Examination:  General exam: Appears calm and comfortable, no distress noted Respiratory system: Clear to auscultation. Respiratory effort normal. Cardiovascular system: S1 & S2 heard, RRR. No JVD, Gastrointestinal system: Abdomen is nondistended, soft and nontender. No organomegaly or masses felt. Normal bowel sounds heard. Central nervous system: Alert and oriented. No focal neurological deficits. Extremities: Symmetric 5 x 5 power. Skin: No rashes, lesions or ulcers Psychiatry:  Mood & affect appropriate.     Data Reviewed: I have personally reviewed following labs and imaging studies  CBC: Recent Labs  Lab 08/28/18 0548 09/01/18 0648 09/03/18 0832  WBC 10.6* 11.0* 13.3*  HGB 9.9* 10.3* 11.7*  HCT 30.2* 30.8* 35.3*  MCV 97.4 96.9 97.0  PLT 234 262 270   Basic Metabolic Panel: Recent Labs  Lab 08/28/18 0524  08/30/18  6803 08/31/18 2122 08/31/18 1547 09/01/18 0648 09/02/18 0444 09/03/18 0849  NA 135   < > 136 135 133* 136 136 135  K 4.9   < > 4.9 6.4* 5.7* 5.7* 5.0 3.8  CL 95*   < > 94* 92* 93* 92* 95* 94*  CO2 24   < > 25 24 24 24 26 24   GLUCOSE 208*   < > 185*  221* 278* 230* 122* 95  BUN 41*   < > 50* 77* 88* 104* 53* 44*  CREATININE 7.65*   < > 7.26* 9.27* 9.84* 10.92* 7.20* 5.40*  CALCIUM 8.8*   < > 8.8* 8.9 9.5 8.9 8.9 8.7*  MG 2.2  --   --   --   --   --   --   --   PHOS 5.7*   < > 5.6* 6.8*  --  7.0* 5.6* 3.3   < > = values in this interval not displayed.   GFR: Estimated Creatinine Clearance: 11.6 mL/min (A) (by C-G formula based on SCr of 5.4 mg/dL (H)). Liver Function Tests: Recent Labs  Lab 08/30/18 0604 08/31/18 0655 09/01/18 0648 09/02/18 0444 09/03/18 0849  ALBUMIN 2.5* 2.5* 2.6* 2.8* 3.1*   No results for input(s): LIPASE, AMYLASE in the last 168 hours. No results for input(s): AMMONIA in the last 168 hours. Coagulation Profile: No results for input(s): INR, PROTIME in the last 168 hours. Cardiac Enzymes: No results for input(s): CKTOTAL, CKMB, CKMBINDEX, TROPONINI in the last 168 hours. BNP (last 3 results) No results for input(s): PROBNP in the last 8760 hours. HbA1C: No results for input(s): HGBA1C in the last 72 hours. CBG: Recent Labs  Lab 09/02/18 1243 09/02/18 1619 09/02/18 2327 09/03/18 0738 09/03/18 1220  GLUCAP 170* 123* 160* 125* 114*   Lipid Profile: No results for input(s): CHOL, HDL, LDLCALC, TRIG, CHOLHDL, LDLDIRECT in the last 72 hours. Thyroid Function Tests: No results for input(s): TSH, T4TOTAL, FREET4, T3FREE, THYROIDAB in the last 72 hours. Anemia Panel: No results for input(s): VITAMINB12, FOLATE, FERRITIN, TIBC, IRON, RETICCTPCT in the last 72 hours. Sepsis Labs: No results for input(s): PROCALCITON, LATICACIDVEN in the last 168 hours.  Recent Results (from the past 240 hour(s))  SARS Coronavirus 2 [facility] order, Performed in [place] hospital lab)     Status: Abnormal   Collection Time: 08/30/18  5:51 AM   Specimen: Nasopharyngeal Swab  Result Value Ref Range Status   SARS Coronavirus 2 POSITIVE (A) NEGATIVE Final    Comment: CRITICAL RESULT CALLED TO, READ BACK BY AND  VERIFIED WITH: RN Alphia Moh [id number] AT 370 AM BY CM (NOTE) If result is NEGATIVE SARS-CoV-2 target nucleic acids are NOT DETECTED. The SARS-CoV-2 RNA is generally detectable in upper and lower  respiratory specimens during the acute phase of infection. The lowest  concentration of SARS-CoV-2 viral copies this assay can detect is 250  copies / mL. A negative result does not preclude SARS-CoV-2 infection  and should not be used as the sole basis for treatment or other  patient management decisions.  A negative result may occur with  improper specimen collection / handling, submission of specimen other  than nasopharyngeal swab, presence of viral mutation(s) within the  areas targeted by this assay, and inadequate number of viral copies  (<250 copies / mL). A negative result must be combined with clinical  observations, patient history, and epidemiological information. If result is POSITIVE SARS-CoV-2 target nucleic acids are DE TECTED. The SARS-CoV-2 RNA is  generally detectable in upper and lower  respiratory specimens during the acute phase of infection.  Positive  results are indicative of active infection with SARS-CoV-2.  Clinical  correlation with patient history and other diagnostic information is  necessary to determine patient infection status.  Positive results do  not rule out bacterial infection or co-infection with other viruses. If result is PRESUMPTIVE POSTIVE SARS-CoV-2 nucleic acids MAY BE PRESENT.   A presumptive positive result was obtained on the submitted specimen  and confirmed on repeat testing.  While 2019 novel coronavirus  (SARS-CoV-2) nucleic acids may be present in the submitted sample  additional confirmatory testing may be necessary for epidemiological  and / or clinical management purposes  to differentiate between  SARS-CoV-2 and other Sarbecovirus currently known to infect humans.  If clinically indicated additional testing with an alternate test   methodology (L [id number]) is advised. The SARS-CoV-2 RNA is generally  detectable in upper and lower respiratory specimens during the acute  phase of infection. The expected result is Negative. Fact Sheet for Patients:  StrictlyIdeas.no Fact Sheet for Healthcare Providers: BankingDealers.co.za This test is not yet approved or cleared by the [place] FDA and has been authorized for detection and/or diagnosis of SARS-CoV-2 by FDA under an Emergency Use Authorization (EUA).  This EUA will remain in effect (meaning this test can be used) for the duration of the COVID-19 declaration under Section 564(b)(1) of the Act, 21 U.S.C. section 360bbb-3(b)(1), unless the authorization is terminated or revoked sooner. Performed at [facility], [place] [address] [place], [place] [address]   SARS Coronavirus 2 (CEPHEID - Performed in [facility] hospital lab), Hosp Order     Status: Abnormal   Collection Time: 09/03/18  1:41 PM   Specimen: Nasopharyngeal Swab  Result Value Ref Range Status   SARS Coronavirus 2 POSITIVE (A) NEGATIVE Final    Comment: CRITICAL VALUE NOTED.  VALUE IS CONSISTENT WITH PREVIOUSLY REPORTED AND CALLED VALUE. (NOTE) If result is NEGATIVE SARS-CoV-2 target nucleic acids are NOT DETECTED. The SARS-CoV-2 RNA is generally detectable in upper and lower  respiratory specimens during the acute phase of infection. The lowest  concentration of SARS-CoV-2 viral copies this assay can detect is 250  copies / mL. A negative result does not preclude SARS-CoV-2 infection  and should not be used as the sole basis for treatment or other  patient management decisions.  A negative result may occur with  improper specimen collection / handling, submission of specimen other  than nasopharyngeal swab, presence of viral mutation(s) within the  areas targeted by this assay, and inadequate number of viral copies  (<250 copies / mL). A negative  result must be combined with clinical  observations, patient history, and epidemiological information. If result is POSITIVE SARS-CoV-2 target nucleic acids are DETECTED. Th e SARS-CoV-2 RNA is generally detectable in upper and lower  respiratory specimens during the acute phase of infection.  Positive  results are indicative of active infection with SARS-CoV-2.  Clinical  correlation with patient history and other diagnostic information is  necessary to determine patient infection status.  Positive results do  not rule out bacterial infection or co-infection with other viruses. If result is PRESUMPTIVE POSTIVE SARS-CoV-2 nucleic acids MAY BE PRESENT.   A presumptive positive result was obtained on the submitted specimen  and confirmed on repeat testing.  While 2019 novel coronavirus  (SARS-CoV-2) nucleic acids may be present in the submitted sample  additional confirmatory testing may be necessary for  epidemiological  and / or clinical management purposes  to differentiate between  SARS-CoV-2 and other Sarbecovirus currently known to infect humans.  If clinically indicated additional testing with an alternate test  methodology [phone]) is  advised. The SARS-CoV-2 RNA is generally  detectable in upper and lower respiratory specimens during the acute  phase of infection. The expected result is Negative. Fact Sheet for Patients:  StrictlyIdeas.no Fact Sheet for Healthcare Providers: BankingDealers.co.za This test is not yet approved or cleared by the [place] FDA and has been authorized for detection and/or diagnosis of SARS-CoV-2 by FDA under an Emergency Use Authorization (EUA).  This EUA will remain in effect (meaning this test can be used) for the duration of the COVID-19 declaration under Section 564(b)(1) of the Act, 21 U.S.C. section 360bbb-3(b)(1), unless the authorization is terminated or revoked sooner. Performed at [facility], [place] [address], [place], [place] [address]          Radiology Studies: No results found.      Scheduled Meds: . amiodarone  400 mg Oral BID  . aspirin EC  81 mg Oral Daily  . atorvastatin  80 mg Oral q1800  . calcium acetate  2,001 mg Oral TID WC  . Chlorhexidine Gluconate Cloth  6 each Topical Q0600  . darbepoetin (ARANESP) injection - DIALYSIS  60 mcg Intravenous Q Wed-HD  . feeding supplement (NEPRO CARB STEADY)  237 mL Oral BID BM  . FLUoxetine  20 mg Oral Daily  . heparin  5,000 Units Subcutaneous Q8H  . insulin aspart  0-5 Units Subcutaneous QHS  . insulin aspart  0-9 Units Subcutaneous TID WC  . insulin aspart  5 Units Subcutaneous TID WC  . insulin glargine  45 Units Subcutaneous Daily  . levothyroxine  50 mcg Oral Q0600  . mouth rinse  15 mL Mouth Rinse BID  . methylPREDNISolone (SOLU-MEDROL) injection  50 mg Intravenous Q12H  . midodrine  10 mg Oral TID WC  . multivitamin  1 tablet Oral QHS  . pantoprazole  40 mg Oral Daily  . polyethylene glycol  17 g Oral Daily  . vitamin C  500 mg Oral Daily  . zinc sulfate  220 mg Oral Daily   Continuous Infusions: . sodium chloride    . sodium chloride    . albumin human    . albumin human       LOS: 11 days    Time spent: 34 minutes.     Hosie Poisson, MD [facility] Pager [phone]  If 7PM-7AM, please contact night-coverage [email or web address] Password [facility] 09/03/2018, 3:46 PM

## 2018-09-04 ENCOUNTER — Ambulatory Visit (INDEPENDENT_AMBULATORY_CARE_PROVIDER_SITE_OTHER): Payer: Medicare Other | Admitting: Vascular Surgery

## 2018-09-04 ENCOUNTER — Encounter (INDEPENDENT_AMBULATORY_CARE_PROVIDER_SITE_OTHER): Payer: Medicare Other

## 2018-09-04 LAB — CBC
HCT: 33.1 % — ABNORMAL LOW (ref 36.0–46.0)
Hemoglobin: 11 g/dL — ABNORMAL LOW (ref 12.0–15.0)
MCH: 32.3 pg (ref 26.0–34.0)
MCHC: 33.2 g/dL (ref 30.0–36.0)
MCV: 97.1 fL (ref 80.0–100.0)
Platelets: 249 10*3/uL (ref 150–400)
RBC: 3.41 MIL/uL — ABNORMAL LOW (ref 3.87–5.11)
RDW: 14 % (ref 11.5–15.5)
WBC: 12.7 10*3/uL — ABNORMAL HIGH (ref 4.0–10.5)
nRBC: 2.2 % — ABNORMAL HIGH (ref 0.0–0.2)

## 2018-09-04 LAB — RENAL FUNCTION PANEL
Albumin: 2.9 g/dL — ABNORMAL LOW (ref 3.5–5.0)
Anion gap: 14 (ref 5–15)
BUN: 51 mg/dL — ABNORMAL HIGH (ref 8–23)
CO2: 27 mmol/L (ref 22–32)
Calcium: 9.2 mg/dL (ref 8.9–10.3)
Chloride: 96 mmol/L — ABNORMAL LOW (ref 98–111)
Creatinine, Ser: 7 mg/dL — ABNORMAL HIGH (ref 0.44–1.00)
GFR calc Af Amer: 7 mL/min — ABNORMAL LOW (ref >60)
GFR calc non Af Amer: 6 mL/min — ABNORMAL LOW (ref >60)
Glucose, Bld: 100 mg/dL — ABNORMAL HIGH (ref 70–99)
Phosphorus: 5.1 mg/dL — ABNORMAL HIGH (ref 2.5–4.6)
Potassium: 5.3 mmol/L — ABNORMAL HIGH (ref 3.5–5.1)
Sodium: 137 mmol/L (ref 135–145)

## 2018-09-04 LAB — GLUCOSE, CAPILLARY
Glucose-Capillary: 101 mg/dL — ABNORMAL HIGH (ref 70–99)
Glucose-Capillary: 181 mg/dL — ABNORMAL HIGH (ref 70–99)
Glucose-Capillary: 181 mg/dL — ABNORMAL HIGH (ref 70–99)
Glucose-Capillary: 161 mg/dL — ABNORMAL HIGH (ref 70–99)

## 2018-09-04 LAB — LACTATE DEHYDROGENASE: LDH: 261 U/L — ABNORMAL HIGH (ref 98–192)

## 2018-09-04 MED ORDER — AMIODARONE HCL 200 MG PO TABS
400.0000 mg | ORAL_TABLET | Freq: Every day | ORAL | Status: DC
  Administered 2018-09-05 – 2018-09-10 (×6): 400 mg via ORAL
  Filled 2018-09-04 (×6): qty 2

## 2018-09-04 MED ORDER — CHLORHEXIDINE GLUCONATE CLOTH 2 % EX PADS
6.0000 | MEDICATED_PAD | Freq: Every day | CUTANEOUS | Status: DC
  Administered 2018-09-04 – 2018-09-06 (×3): 6 via TOPICAL

## 2018-09-04 MED ORDER — SODIUM ZIRCONIUM CYCLOSILICATE 10 G PO PACK
10.0000 g | PACK | Freq: Two times a day (BID) | ORAL | Status: AC
  Administered 2018-09-04 (×2): 10 g via ORAL
  Filled 2018-09-04 (×3): qty 1

## 2018-09-04 NOTE — Progress Notes (Signed)
Patient husband called requesting update. MD text paged.

## 2018-09-04 NOTE — Progress Notes (Signed)
PROGRESS NOTE    Jamie Arias  EZM:[id number] DOB: 10/09/1955 DOA: 08/23/2018 PCP: [name], MD   Brief Narrative:   63 y.o.femalewith medical history significant ofESRD on HDMWF,anemia, chronic diastolic congestive heart failure, CAD status post PCI, type 2 diabetes, hypertension, hyperlipidemia, hypothyroidism, and conditions listed belowpresenting as a transfer from [facility]. COVID-19positive. Patient transferred to [facility] for management of COVID-19 and hemodialysis.  Reports depression. Not on antidepressant. Psych consulted.  Appreciate recommendations.   Assessment & Plan:   Principal Problem:   COVID-19 virus infection Active Problems:   HTN (hypertension)   ESRD (end stage renal disease) (HCC)   Acute on chronic respiratory failure with hypoxia (HCC)   UTI (urinary tract infection)   Adjustment disorder with depressed mood   Hyperkalemia secondary to end-stage renal disease. lokelma ordered.  Patient is on dialysis Monday Wednesday Friday. Further management as per nephrology.    Newly diagnosed atrial fibrillation Rate controlled and appreciate cardiology recommendations. Episodes of bradycardia evident, will decrease the dose of amiodarone to 400 mg daily.  Defer to cardiology for starting anticoagulation. As per cardiology no anti coagulation given elevated risk for bleeding from COVID 19 Complications.     Depression Patient was started on Prozac 20 mg daily and trazodone. Further management as per psychiatry as outpatient.   Persistent generalized weakness/deconditioning PT OT recommended CIR Patient unable to be admitted to CIR due to positive COVID-19.   Hypothyroidism Follow-up with TSH in 4 to 6 weeks.   Intractable nausea and vomiting.  Resolved   Acute respiratory failure secondary to COVID-19 related pneumonia Patient continues to require 2 L of nasal cannula oxygen to keep oxygen sats greater than 95%.  Slowly taper steroids.   Type 2 diabetes with hyperglycemia well controll Continue with sliding scale insulin CBG (last 3)  Recent Labs    09/03/18 2045 09/04/18 0605 09/04/18 1123  GLUCAP 108* 101* 181*     DVT prophylaxis: (Heparin Code Status: Full code Family Communication: None at bedside, will call her son and update  Disposition Plan: Pending COVID 19- testing.  Consultants:   Psychiatry, nephrology  Procedures: None  Antimicrobials: None  Subjective: No sob, or chest pain. Told her about the new results of the COVID TEST.   Objective: Vitals:   09/03/18 1107 09/03/18 1553 09/04/18 0043 09/04/18 0806  BP: (!) 128/53 (!) 127/42 (!) 142/51 (!) 151/48  Pulse: (!) 58 (!) 54 (!) 52   Resp:  18    Temp: (!) 97.4 F (36.3 C) 97.8 F (36.6 C) 98.3 F (36.8 C) 98.1 F (36.7 C)  TempSrc: Axillary Oral Oral Oral  SpO2: 100% 100% 98%   Weight: 92 kg     Height:        Intake/Output Summary (Last 24 hours) at 09/04/2018 1334 Last data filed at 09/04/2018 0600 Gross per 24 hour  Intake 480 ml  Output -  Net 480 ml   Filed Weights   09/01/18 2139 09/03/18 0710 09/03/18 1107  Weight: 92.5 kg 92.5 kg 92 kg    Examination:  General exam: Appears calm and comfortable, no distress noted Respiratory system: Clear to auscultation. Respiratory effort normal. No wheezing or rhonchi.  Cardiovascular system: S1 & S2 heard, RRR. No JVD, Gastrointestinal system: Abdomen is soft NT ND BS+ Central nervous system: Alert and oriented. No focal neurological deficits. Extremities: Symmetric 5 x 5 power. Skin: No rashes, lesions or ulcers Psychiatry:  Mood & affect appropriate.     Data Reviewed:  I have personally reviewed following labs and imaging studies  CBC: Recent Labs  Lab 09/01/18 0648 09/03/18 0832 09/04/18 0510  WBC 11.0* 13.3* 12.7*  HGB 10.3* 11.7* 11.0*  HCT 30.8* 35.3* 33.1*  MCV 96.9 97.0 97.1  PLT 262 285 373   Basic Metabolic Panel: Recent  Labs  Lab 08/31/18 0655 08/31/18 1547 09/01/18 0648 09/02/18 0444 09/03/18 0849 09/04/18 0510  NA 135 133* 136 136 135 137  K 6.4* 5.7* 5.7* 5.0 3.8 5.3*  CL 92* 93* 92* 95* 94* 96*  CO2 24 24 24 26 24 27   GLUCOSE 221* 278* 230* 122* 95 100*  BUN 77* 88* 104* 53* 44* 51*  CREATININE 9.27* 9.84* 10.92* 7.20* 5.40* 7.00*  CALCIUM 8.9 9.5 8.9 8.9 8.7* 9.2  PHOS 6.8*  --  7.0* 5.6* 3.3 5.1*   GFR: Estimated Creatinine Clearance: 8.9 mL/min (A) (by C-G formula based on SCr of 7 mg/dL (H)). Liver Function Tests: Recent Labs  Lab 08/31/18 0655 09/01/18 4287 09/02/18 0444 09/03/18 0849 09/04/18 0510  ALBUMIN 2.5* 2.6* 2.8* 3.1* 2.9*   No results for input(s): LIPASE, AMYLASE in the last 168 hours. No results for input(s): AMMONIA in the last 168 hours. Coagulation Profile: No results for input(s): INR, PROTIME in the last 168 hours. Cardiac Enzymes: No results for input(s): CKTOTAL, CKMB, CKMBINDEX, TROPONINI in the last 168 hours. BNP (last 3 results) No results for input(s): PROBNP in the last 8760 hours. HbA1C: No results for input(s): HGBA1C in the last 72 hours. CBG: Recent Labs  Lab 09/03/18 1550 09/03/18 1824 09/03/18 2045 09/04/18 0605 09/04/18 1123  GLUCAP 90 162* 108* 101* 181*   Lipid Profile: No results for input(s): CHOL, HDL, LDLCALC, TRIG, CHOLHDL, LDLDIRECT in the last 72 hours. Thyroid Function Tests: No results for input(s): TSH, T4TOTAL, FREET4, T3FREE, THYROIDAB in the last 72 hours. Anemia Panel: No results for input(s): VITAMINB12, FOLATE, FERRITIN, TIBC, IRON, RETICCTPCT in the last 72 hours. Sepsis Labs: No results for input(s): PROCALCITON, LATICACIDVEN in the last 168 hours.  Recent Results (from the past 240 hour(s))  SARS Coronavirus 2 [facility] order, Performed in [place] hospital lab)     Status: Abnormal   Collection Time: 08/30/18  5:51 AM   Specimen: Nasopharyngeal Swab  Result Value Ref Range Status   SARS Coronavirus 2  POSITIVE (A) NEGATIVE Final    Comment: CRITICAL RESULT CALLED TO, READ BACK BY AND VERIFIED WITH: RN Alphia Moh [id number] AT 262 AM BY CM (NOTE) If result is NEGATIVE SARS-CoV-2 target nucleic acids are NOT DETECTED. The SARS-CoV-2 RNA is generally detectable in upper and lower  respiratory specimens during the acute phase of infection. The lowest  concentration of SARS-CoV-2 viral copies this assay can detect is 250  copies / mL. A negative result does not preclude SARS-CoV-2 infection  and should not be used as the sole basis for treatment or other  patient management decisions.  A negative result may occur with  improper specimen collection / handling, submission of specimen other  than nasopharyngeal swab, presence of viral mutation(s) within the  areas targeted by this assay, and inadequate number of viral copies  (<250 copies / mL). A negative result must be combined with clinical  observations, patient history, and epidemiological information. If result is POSITIVE SARS-CoV-2 target nucleic acids are DE TECTED. The SARS-CoV-2 RNA is generally detectable in upper and lower  respiratory specimens during the acute phase of infection.  Positive  results are indicative of active infection  with SARS-CoV-2.  Clinical  correlation with patient history and other diagnostic information is  necessary to determine patient infection status.  Positive results do  not rule out bacterial infection or co-infection with other viruses. If result is PRESUMPTIVE POSTIVE SARS-CoV-2 nucleic acids MAY BE PRESENT.   A presumptive positive result was obtained on the submitted specimen  and confirmed on repeat testing.  While 2019 novel coronavirus  (SARS-CoV-2) nucleic acids may be present in the submitted sample  additional confirmatory testing may be necessary for epidemiological  and / or clinical management purposes  to differentiate between  SARS-CoV-2 and other Sarbecovirus currently known to  infect humans.  If clinically indicated additional testing with an alternate test  methodology (L [id number]) is advised. The SARS-CoV-2 RNA is generally  detectable in upper and lower respiratory specimens during the acute  phase of infection. The expected result is Negative. Fact Sheet for Patients:  StrictlyIdeas.no Fact Sheet for Healthcare Providers: BankingDealers.co.za This test is not yet approved or cleared by the [place] FDA and has been authorized for detection and/or diagnosis of SARS-CoV-2 by FDA under an Emergency Use Authorization (EUA).  This EUA will remain in effect (meaning this test can be used) for the duration of the COVID-19 declaration under Section 564(b)(1) of the Act, 21 U.S.C. section 360bbb-3(b)(1), unless the authorization is terminated or revoked sooner. Performed at [facility], [place] [address] [place], [place] [address]   SARS Coronavirus 2 (CEPHEID - Performed in [facility] hospital lab), Hosp Order     Status: Abnormal   Collection Time: 09/03/18  1:41 PM   Specimen: Nasopharyngeal Swab  Result Value Ref Range Status   SARS Coronavirus 2 POSITIVE (A) NEGATIVE Final    Comment: CRITICAL VALUE NOTED.  VALUE IS CONSISTENT WITH PREVIOUSLY REPORTED AND CALLED VALUE. (NOTE) If result is NEGATIVE SARS-CoV-2 target nucleic acids are NOT DETECTED. The SARS-CoV-2 RNA is generally detectable in upper and lower  respiratory specimens during the acute phase of infection. The lowest  concentration of SARS-CoV-2 viral copies this assay can detect is 250  copies / mL. A negative result does not preclude SARS-CoV-2 infection  and should not be used as the sole basis for treatment or other  patient management decisions.  A negative result may occur with  improper specimen collection / handling, submission of specimen other  than nasopharyngeal swab, presence of viral mutation(s) within the  areas targeted by  this assay, and inadequate number of viral copies  (<250 copies / mL). A negative result must be combined with clinical  observations, patient history, and epidemiological information. If result is POSITIVE SARS-CoV-2 target nucleic acids are DETECTED. Th e SARS-CoV-2 RNA is generally detectable in upper and lower  respiratory specimens during the acute phase of infection.  Positive  results are indicative of active infection with SARS-CoV-2.  Clinical  correlation with patient history and other diagnostic information is  necessary to determine patient infection status.  Positive results do  not rule out bacterial infection or co-infection with other viruses. If result is PRESUMPTIVE POSTIVE SARS-CoV-2 nucleic acids MAY BE PRESENT.   A presumptive positive result was obtained on the submitted specimen  and confirmed on repeat testing.  While 2019 novel coronavirus  (SARS-CoV-2) nucleic acids may be present in the submitted sample  additional confirmatory testing may be necessary for epidemiological  and / or clinical management purposes  to differentiate between  SARS-CoV-2 and other Sarbecovirus currently known to infect humans.  If  clinically indicated additional testing with an alternate test  methodology [phone]) is  advised. The SARS-CoV-2 RNA is generally  detectable in upper and lower respiratory specimens during the acute  phase of infection. The expected result is Negative. Fact Sheet for Patients:  StrictlyIdeas.no Fact Sheet for Healthcare Providers: BankingDealers.co.za This test is not yet approved or cleared by the [place] FDA and has been authorized for detection and/or diagnosis of SARS-CoV-2 by FDA under an Emergency Use Authorization (EUA).  This EUA will remain in effect (meaning this test can be used) for the duration of the COVID-19 declaration under Section 564(b)(1) of the Act, 21 U.S.C. section  360bbb-3(b)(1), unless the authorization is terminated or revoked sooner. Performed at [facility], [place] [address], [place], [place] [address]          Radiology Studies: No results found.      Scheduled Meds: . amiodarone  400 mg Oral BID  . aspirin EC  81 mg Oral Daily  . atorvastatin  80 mg Oral q1800  . calcium acetate  2,001 mg Oral TID WC  . Chlorhexidine Gluconate Cloth  6 each Topical Q0600  . Chlorhexidine Gluconate Cloth  6 each Topical Q0600  . darbepoetin (ARANESP) injection - DIALYSIS  60 mcg Intravenous Q Wed-HD  . feeding supplement (NEPRO CARB STEADY)  237 mL Oral BID BM  . FLUoxetine  20 mg Oral Daily  . heparin  5,000 Units Subcutaneous Q8H  . insulin aspart  0-5 Units Subcutaneous QHS  . insulin aspart  0-9 Units Subcutaneous TID WC  . insulin aspart  5 Units Subcutaneous TID WC  . insulin glargine  45 Units Subcutaneous Daily  . levothyroxine  50 mcg Oral Q0600  . mouth rinse  15 mL Mouth Rinse BID  . methylPREDNISolone (SOLU-MEDROL) injection  40 mg Intravenous Q12H  . midodrine  10 mg Oral TID WC  . multivitamin  1 tablet Oral QHS  . pantoprazole  40 mg Oral Daily  . polyethylene glycol  17 g Oral Daily  . sodium zirconium cyclosilicate  10 g Oral BID WC  . vitamin C  500 mg Oral Daily  . zinc sulfate  220 mg Oral Daily   Continuous Infusions: . sodium chloride    . sodium chloride    . albumin human    . albumin human       LOS: 12 days    Time spent: 34 minutes.     Hosie Poisson, MD [facility] Pager [phone]  If 7PM-7AM, please contact night-coverage [email or web address] Password [email or web address] 09/04/2018, 1:34 PM

## 2018-09-04 NOTE — Progress Notes (Signed)
Patient husband called and was updated

## 2018-09-04 NOTE — Progress Notes (Signed)
Messaged doctor regarding K+ 5.3.  Awaiting response.

## 2018-09-04 NOTE — Progress Notes (Signed)
[facility] NEPHROLOGY PROGRESS NOTE  Assessment/ Plan: Pt is a 63 y.o. yo female with ESRD on HD MWF transferred from [place] for generalized weakness secondary due to COVID-19.  # ESRD MWF:  -UF only 500 cc yesterday because of hypotension.  Plan for next dialysis tomorrow.  Continue midodrine and albumin before dialysis.  Ordered Lokelma for mild hyperkalemia.  Discussed with the dialysis nurse and patient's bedside nurse. - Per prior chart, the outpatient dialysis has been arranged at [facility], MWF third shift if is still COVID positive when she is medically stable to discharge.  # Anemia of chronic kidney disease: Continue Aranesp.  Hemoglobin acceptable.  # Secondary hyperparathyroidism: Phosphorus level improved.  Continue PhosLo.  Patient has poor oral intake.  # HTN/volume: On midodrine for hypotension. Monitor BP.  #COVID-19 positive: Per primary team, on  steroid  #Altered mental status lethargy per nursing staff: Minimizing sedatives.  Primary team is following and seen by psychiatrist for the depression.  Discussed with the patient's nurse in detail.  Subjective:  Due to the nature of this patient's suspected COVID-19 with isolation and in keeping with efforts to prevent the spread of infection and to conserve personal protective equipment, a physical exam was not personally performed.  Patient's symptoms and exam were discussed in detail with the RN.  A chart review of other providers notes and the patient's lab work as well as review of other pertinent studies was performed.  Exam details from prior documentation were reviewed specifically and confirmed with the bedside nurse.  Location of service: [name] Objective Vital signs in last 24 hours: Vitals:   09/03/18 1107 09/03/18 1553 09/04/18 0043 09/04/18 0806  BP: (!) 128/53 (!) 127/42 (!) 142/51 (!) 151/48  Pulse: (!) 58 (!) 54 (!) 52   Resp:  18    Temp: (!) 97.4 F (36.3 C) 97.8 F (36.6  C) 98.3 F (36.8 C) 98.1 F (36.7 C)  TempSrc: Axillary Oral Oral Oral  SpO2: 100% 100% 98%   Weight: 92 kg     Height:       Weight change:   Intake/Output Summary (Last 24 hours) at 09/04/2018 1017 Last data filed at 09/04/2018 0600 Gross per 24 hour  Intake 480 ml  Output 507 ml  Net -27 ml       Labs: Basic Metabolic Panel: Recent Labs  Lab 09/02/18 0444 09/03/18 0849 09/04/18 0510  NA 136 135 137  K 5.0 3.8 5.3*  CL 95* 94* 96*  CO2 26 24 27   GLUCOSE 122* 95 100*  BUN 53* 44* 51*  CREATININE 7.20* 5.40* 7.00*  CALCIUM 8.9 8.7* 9.2  PHOS 5.6* 3.3 5.1*   Liver Function Tests: Recent Labs  Lab 09/02/18 0444 09/03/18 0849 09/04/18 0510  ALBUMIN 2.8* 3.1* 2.9*   No results for input(s): LIPASE, AMYLASE in the last 168 hours. No results for input(s): AMMONIA in the last 168 hours. CBC: Recent Labs  Lab 09/01/18 0648 09/03/18 0832 09/04/18 0510  WBC 11.0* 13.3* 12.7*  HGB 10.3* 11.7* 11.0*  HCT 30.8* 35.3* 33.1*  MCV 96.9 97.0 97.1  PLT 262 285 249   Cardiac Enzymes: No results for input(s): CKTOTAL, CKMB, CKMBINDEX, TROPONINI in the last 168 hours. CBG: Recent Labs  Lab 09/03/18 1220 09/03/18 1550 09/03/18 1824 09/03/18 2045 09/04/18 0605  GLUCAP 114* 90 162* 108* 101*    Iron Studies: No results for input(s): IRON, TIBC, TRANSFERRIN, FERRITIN in the last 72 hours. Studies/Results: No results  found.  Medications: Infusions: . sodium chloride    . sodium chloride    . albumin human    . albumin human      Scheduled Medications: . amiodarone  400 mg Oral BID  . aspirin EC  81 mg Oral Daily  . atorvastatin  80 mg Oral q1800  . calcium acetate  2,001 mg Oral TID WC  . Chlorhexidine Gluconate Cloth  6 each Topical Q0600  . darbepoetin (ARANESP) injection - DIALYSIS  60 mcg Intravenous Q Wed-HD  . feeding supplement (NEPRO CARB STEADY)  237 mL Oral BID BM  . FLUoxetine  20 mg Oral Daily  . heparin  5,000 Units Subcutaneous Q8H  .  insulin aspart  0-5 Units Subcutaneous QHS  . insulin aspart  0-9 Units Subcutaneous TID WC  . insulin aspart  5 Units Subcutaneous TID WC  . insulin glargine  45 Units Subcutaneous Daily  . levothyroxine  50 mcg Oral Q0600  . mouth rinse  15 mL Mouth Rinse BID  . methylPREDNISolone (SOLU-MEDROL) injection  40 mg Intravenous Q12H  . midodrine  10 mg Oral TID WC  . multivitamin  1 tablet Oral QHS  . pantoprazole  40 mg Oral Daily  . polyethylene glycol  17 g Oral Daily  . sodium zirconium cyclosilicate  10 g Oral BID WC  . vitamin C  500 mg Oral Daily  . zinc sulfate  220 mg Oral Daily    have reviewed scheduled and prn medications.   Azaliah Carrero Prasad Carmella Kees 09/04/2018,10:17 AM  LOS: 12 days  Pager: [phone]

## 2018-09-04 NOTE — Progress Notes (Signed)
Inpatient Rehabilitation Admissions Coordinator  Noted Covid + 7/1. Patient making slow progress. Recommend SNF rehab once medically ready for d/c at this time if unable to d/c home. We will sign off at this time. Please re consult if pt becomes appropriate per CDC guidelines post COVID.  Danne Baxter, RN, MSN Rehab Admissions Coordinator [phone] 09/04/2018 10:18 AM

## 2018-09-04 NOTE — Progress Notes (Signed)
Physical Therapy Treatment Patient Details Name: Jamie Arias MRN: [id number] DOB: 09-13-1955 Today's Date: 09/04/2018    History of Present Illness ELLENOR WISNIEWSKI is a 63 y.o. female with medical history significant of ESRD on HD MWF, anemia, chronic diastolic congestive heart failure, CAD status post PCI, type 2 diabetes, hypertension, hyperlipidemia, hypothyroidism, and conditions listed below presenting as a transfer from [facility].  COVID-19 positive.  Patient transferred to [facility] for management of COVID-19 and hemodialysis.     PT Comments    Pt cont require cues for participation during session, with slow progress. Noted CIR denial, updated recs to SNF. Worked with bed level therex, limited OOB mobility due to fecal incontinence.    Follow Up Recommendations  SNF     Equipment Recommendations  Rolling walker with 5" wheels;3in1 (PT)    Recommendations for Other Services OT consult     Precautions / Restrictions Precautions Precautions: Fall Precaution Comments: no tremors today, only staring episodes Restrictions Weight Bearing Restrictions: No    Mobility  Bed Mobility Overal bed mobility: Needs Assistance Bed Mobility: Supine to Sit;Sit to Supine     Supine to sit: Mod assist;HOB elevated Sit to supine: Mod assist;HOB elevated   General bed mobility comments: Mod assist to support trunk to come to sitting EOB.  Pt with posterior right preferenc during transitions. assist for LEs when returning to supine, pt able to minimally assist with boosting towards [facility]  Transfers                 General transfer comment: deferred due to fecal incontinence, RN notified.   Ambulation/Gait                 Stairs             Wheelchair Mobility    Modified Rankin (Stroke Patients Only)       Balance Overall balance assessment: Needs assistance Sitting-balance support: Feet supported;Bilateral upper extremity supported Sitting  balance-Leahy Scale: Fair Sitting balance - Comments: pt able to maintain sitting balance today with minguard assist once positioned; reports +dizziness in sitting                                    Cognition Arousal/Alertness: Awake/alert Behavior During Therapy: Flat affect Overall Cognitive Status: Impaired/Different from baseline Area of Impairment: Attention;Following commands;Awareness;Problem solving                   Current Attention Level: Sustained Memory: Decreased short-term memory Following Commands: Follows one step commands inconsistently   Awareness: Intellectual Problem Solving: Slow processing;Decreased initiation General Comments: continues to require mulitmodal cuing for arrousal and invovlment in session.       Exercises General Exercises - Upper Extremity Shoulder Flexion: AROM;Both;10 reps;Seated General Exercises - Lower Extremity Ankle Circles/Pumps: AROM;Both;10 reps Long Arc Quad: AROM;Both;10 reps(modified, bed in chair position) Heel Slides: AROM;10 reps Hip ABduction/ADduction: AROM;20 reps Straight Leg Raises: AROM;Both;10 reps    General Comments        Pertinent Vitals/Pain      Home Living                      Prior Function            PT Goals (current goals can now be found in the care plan section) Acute Rehab PT Goals Patient Stated Goal: wants to get home to her  grandson, he's coming home soon from deployment PT Goal Formulation: With patient Time For Goal Achievement: 09/07/18 Potential to Achieve Goals: Good Progress towards PT goals: Progressing toward goals    Frequency    Min 3X/week      PT Plan Current plan remains appropriate    Co-evaluation              AM-PAC PT "6 Clicks" Mobility   Outcome Measure  Help needed turning from your back to your side while in a flat bed without using bedrails?: A Lot Help needed moving from lying on your back to sitting on the side  of a flat bed without using bedrails?: A Lot Help needed moving to and from a bed to a chair (including a wheelchair)?: Total Help needed standing up from a chair using your arms (e.g., wheelchair or bedside chair)?: Total Help needed to walk in hospital room?: Total Help needed climbing 3-5 steps with a railing? : Total 6 Click Score: 8    End of Session Equipment Utilized During Treatment: Oxygen Activity Tolerance: Patient limited by fatigue Patient left: in bed;with bed alarm set;with call bell/phone within reach Nurse Communication: Other (comment) PT Visit Diagnosis: Unsteadiness on feet (R26.81);Other abnormalities of gait and mobility (R26.89);Other symptoms and signs involving the nervous system (R29.898);Dizziness and giddiness (R42);History of falling (Z91.81)     Time: 1700-1711 PT Time Calculation (min) (ACUTE ONLY): 11 min  Charges:  $Therapeutic Exercise: 8-22 mins                     Reinaldo Berber, PT, DPT Acute Rehabilitation Services Pager: [phone] Office: [phone]     Reinaldo Berber 09/04/2018, 5:19 PM

## 2018-09-04 NOTE — Plan of Care (Signed)
  Problem: Clinical Measurements: Goal: Respiratory complications will improve Outcome: Progressing   Problem: Activity:Needs encouragement to participate in self  care activities Goal: Risk for activity intolerance will decrease Outcome: Progressing

## 2018-09-05 LAB — RENAL FUNCTION PANEL
Albumin: 3 g/dL — ABNORMAL LOW (ref 3.5–5.0)
Anion gap: 18 — ABNORMAL HIGH (ref 5–15)
BUN: 79 mg/dL — ABNORMAL HIGH (ref 8–23)
CO2: 25 mmol/L (ref 22–32)
Calcium: 9.4 mg/dL (ref 8.9–10.3)
Chloride: 95 mmol/L — ABNORMAL LOW (ref 98–111)
Creatinine, Ser: 9.11 mg/dL — ABNORMAL HIGH (ref 0.44–1.00)
GFR calc Af Amer: 5 mL/min — ABNORMAL LOW (ref >60)
GFR calc non Af Amer: 4 mL/min — ABNORMAL LOW (ref >60)
Glucose, Bld: 91 mg/dL (ref 70–99)
Phosphorus: 5.7 mg/dL — ABNORMAL HIGH (ref 2.5–4.6)
Potassium: 5.3 mmol/L — ABNORMAL HIGH (ref 3.5–5.1)
Sodium: 138 mmol/L (ref 135–145)

## 2018-09-05 LAB — LACTATE DEHYDROGENASE: LDH: 320 U/L — ABNORMAL HIGH (ref 98–192)

## 2018-09-05 LAB — GLUCOSE, CAPILLARY
Glucose-Capillary: 133 mg/dL — ABNORMAL HIGH (ref 70–99)
Glucose-Capillary: 90 mg/dL (ref 70–99)
Glucose-Capillary: 131 mg/dL — ABNORMAL HIGH (ref 70–99)
Glucose-Capillary: 148 mg/dL — ABNORMAL HIGH (ref 70–99)

## 2018-09-05 MED ORDER — ALBUMIN HUMAN 25 % IV SOLN
25.0000 g | Freq: Once | INTRAVENOUS | Status: AC
  Administered 2018-09-05: 25 g via INTRAVENOUS

## 2018-09-05 MED ORDER — ALBUMIN HUMAN 25 % IV SOLN
INTRAVENOUS | Status: AC
  Filled 2018-09-05: qty 100

## 2018-09-05 NOTE — Care Management Important Message (Signed)
Important Message  Patient Details  Name: Jamie Arias MRN: [id number] Date of Birth: 05-21-1955   Medicare Important Message Given:  Yes     Zenon Mayo, RN 09/05/2018, 12:29 PM

## 2018-09-05 NOTE — Progress Notes (Signed)
[facility] NEPHROLOGY PROGRESS NOTE  Assessment/ Plan: Pt is a 63 y.o. yo female with ESRD on HD MWF transferred from [place] for generalized weakness secondary due to COVID-19.  # ESRD MWF:  -Just finished dialysis with about 2 L of ultrafiltration.  Tolerated well.  Plan for next HD on Monday.  She required midodrine and albumin before HD. - Per prior chart, the outpatient dialysis has been arranged at [facility], MWF third shift if is still COVID positive when she is medically stable to discharge.  # Anemia of chronic kidney disease: Continue Aranesp.  Hemoglobin acceptable.  # Secondary hyperparathyroidism: Monitor phosphorus level. Continue PhosLo.   # HTN/volume: On midodrine for hypotension. Monitor BP.  #COVID-19 positive: Per primary team, on  steroid  #Altered mental status lethargy per nursing staff: Minimizing sedatives.  Primary team is following and seen by psychiatrist for the depression.  Discussed with the patient's nurse and Dr. Karleen Hampshire.  Subjective:  Due to the nature of this patient's suspected COVID-19 with isolation and in keeping with efforts to prevent the spread of infection and to conserve personal protective equipment, a physical exam was not personally performed.  Patient's symptoms and exam were discussed in detail with the RN.  A chart review of other providers notes and the patient's lab work as well as review of other pertinent studies was performed.  Exam details from prior documentation were reviewed specifically and confirmed with the bedside nurse.  Location of service: [facility]. Objective Vital signs in last 24 hours: Vitals:   09/05/18 1000 09/05/18 1015 09/05/18 1030 09/05/18 1045  BP: (!) 93/50 (!) 111/58 114/61 128/60  Pulse: (!) 57 (!) 54 (!) 55 (!) 54  Resp: 16 15 15 15   Temp:    98.3 F (36.8 C)  TempSrc:    Oral  SpO2:    98%  Weight:    90.2 kg  Height:       Weight change: -2.41 kg  Intake/Output Summary  (Last 24 hours) at 09/05/2018 1103 Last data filed at 09/05/2018 1045 Gross per 24 hour  Intake 0 ml  Output 2000 ml  Net -2000 ml       Labs: Basic Metabolic Panel: Recent Labs  Lab 09/03/18 0849 09/04/18 0510 09/05/18 0649  NA 135 137 138  K 3.8 5.3* 5.3*  CL 94* 96* 95*  CO2 24 27 25   GLUCOSE 95 100* 91  BUN 44* 51* 79*  CREATININE 5.40* 7.00* 9.11*  CALCIUM 8.7* 9.2 9.4  PHOS 3.3 5.1* 5.7*   Liver Function Tests: Recent Labs  Lab 09/03/18 0849 09/04/18 0510 09/05/18 0649  ALBUMIN 3.1* 2.9* 3.0*   No results for input(s): LIPASE, AMYLASE in the last 168 hours. No results for input(s): AMMONIA in the last 168 hours. CBC: Recent Labs  Lab 09/01/18 0648 09/03/18 0832 09/04/18 0510  WBC 11.0* 13.3* 12.7*  HGB 10.3* 11.7* 11.0*  HCT 30.8* 35.3* 33.1*  MCV 96.9 97.0 97.1  PLT 262 285 249   Cardiac Enzymes: No results for input(s): CKTOTAL, CKMB, CKMBINDEX, TROPONINI in the last 168 hours. CBG: Recent Labs  Lab 09/04/18 0605 09/04/18 1123 09/04/18 1613 09/04/18 2049 09/05/18 0752  GLUCAP 101* 181* 181* 161* 133*    Iron Studies: No results for input(s): IRON, TIBC, TRANSFERRIN, FERRITIN in the last 72 hours. Studies/Results: No results found.  Medications: Infusions: . sodium chloride    . sodium chloride    . albumin human    . albumin human  Scheduled Medications: . amiodarone  400 mg Oral Daily  . aspirin EC  81 mg Oral Daily  . atorvastatin  80 mg Oral q1800  . calcium acetate  2,001 mg Oral TID WC  . Chlorhexidine Gluconate Cloth  6 each Topical Q0600  . Chlorhexidine Gluconate Cloth  6 each Topical Q0600  . darbepoetin (ARANESP) injection - DIALYSIS  60 mcg Intravenous Q Wed-HD  . feeding supplement (NEPRO CARB STEADY)  237 mL Oral BID BM  . FLUoxetine  20 mg Oral Daily  . heparin  5,000 Units Subcutaneous Q8H  . insulin aspart  0-5 Units Subcutaneous QHS  . insulin aspart  0-9 Units Subcutaneous TID WC  . insulin aspart  5  Units Subcutaneous TID WC  . insulin glargine  45 Units Subcutaneous Daily  . levothyroxine  50 mcg Oral Q0600  . mouth rinse  15 mL Mouth Rinse BID  . methylPREDNISolone (SOLU-MEDROL) injection  40 mg Intravenous Q12H  . midodrine  10 mg Oral TID WC  . multivitamin  1 tablet Oral QHS  . pantoprazole  40 mg Oral Daily  . polyethylene glycol  17 g Oral Daily  . sodium zirconium cyclosilicate  10 g Oral BID WC  . vitamin C  500 mg Oral Daily  . zinc sulfate  220 mg Oral Daily    have reviewed scheduled and prn medications.   Jaydalyn Demattia Prasad Saivon Prowse 09/05/2018,11:03 AM  LOS: 13 days  Pager: [phone]

## 2018-09-05 NOTE — Progress Notes (Signed)
PROGRESS NOTE    CLAUDELL Arias  GBT:[id number] DOB: 09-Jul-1955 DOA: 08/23/2018 PCP: [name], MD   Brief Narrative:   63 y.o.femalewith medical history significant ofESRD on HDMWF,anemia, chronic diastolic congestive heart failure, CAD status post PCI, type 2 diabetes, hypertension, hyperlipidemia, hypothyroidism, and conditions listed belowpresenting as a [facility]. COVID-19positive. Patient transferred to [facility] for management of COVID-19 and hemodialysis.  Reports depression. Not on antidepressant. Psych consulted.  Appreciate recommendations.   Assessment & Plan:   Principal Problem:   COVID-19 virus infection Active Problems:   HTN (hypertension)   ESRD (end stage renal disease) (HCC)   Acute on chronic respiratory failure with hypoxia (HCC)   UTI (urinary tract infection)   Adjustment disorder with depressed mood   Hyperkalemia secondary to end-stage renal disease. lokelma ordered.  Patient is on dialysis Monday Wednesday Friday. Further management as per nephrology.    Newly diagnosed atrial fibrillation Rate controlled and appreciate cardiology recommendations. Episodes of bradycardia evident, will decrease the dose of amiodarone to 400 mg daily.  Defer to cardiology for starting anticoagulation. As per cardiology no anti coagulation given elevated risk for bleeding from COVID 19 Complications.     Depression Patient was started on Prozac 20 mg daily and trazodone. Further management as per psychiatry as outpatient.   Persistent generalized weakness/deconditioning PT OT recommended CIR Patient unable to be admitted to CIR due to positive COVID-19.   Hypothyroidism Follow-up with TSH in 4 to 6 weeks.   Intractable nausea and vomiting.  Resolved   Acute respiratory failure secondary to COVID-19 related pneumonia Patient continues to require 2 L of nasal cannula oxygen to keep oxygen sats greater than 95%.  Slowly taper steroids.   Type 2 diabetes with hyperglycemia well controll Continue with sliding scale insulin CBG (last 3)  Recent Labs    09/04/18 1613 09/04/18 2049 09/05/18 0752  GLUCAP 181* 161* 133*     DVT prophylaxis: (Heparin Code Status: Full code Family Communication: None at bedside, will call her son and update  Disposition Plan: Pending COVID 19- testing.  Consultants:   Psychiatry, nephrology  Procedures: None  Antimicrobials: None  Subjective: No sob, or chest pain. Told her about the new results of the COVID TEST.   Objective: Vitals:   09/05/18 1000 09/05/18 1015 09/05/18 1030 09/05/18 1045  BP: (!) 93/50 (!) 111/58 114/61 128/60  Pulse: (!) 57 (!) 54 (!) 55 (!) 54  Resp: 16 15 15 15   Temp:    98.3 F (36.8 C)  TempSrc:    Oral  SpO2:    98%  Weight:    90.2 kg  Height:        Intake/Output Summary (Last 24 hours) at 09/05/2018 1114 Last data filed at 09/05/2018 1045 Gross per 24 hour  Intake 0 ml  Output 2000 ml  Net -2000 ml   Filed Weights   09/05/18 0600 09/05/18 0733 09/05/18 1045  Weight: 90.1 kg 92.2 kg 90.2 kg    Examination:  General exam: Appears calm and comfortable, no distress noted Respiratory system: Clear to auscultation. Respiratory effort normal. No wheezing or rhonchi.  Cardiovascular system: S1 & S2 heard, RRR. No JVD, Gastrointestinal system: Abdomen is soft NT ND BS+ Central nervous system: Alert and oriented. No focal neurological deficits. Extremities: Symmetric 5 x 5 power. Skin: No rashes, lesions or ulcers Psychiatry:  Mood & affect appropriate.     Data Reviewed: I have personally reviewed following labs and imaging studies  CBC: Recent Labs  Lab 09/01/18 0648 09/03/18 0832 09/04/18 0510  WBC 11.0* 13.3* 12.7*  HGB 10.3* 11.7* 11.0*  HCT 30.8* 35.3* 33.1*  MCV 96.9 97.0 97.1  PLT 262 285 017   Basic Metabolic Panel: Recent Labs  Lab 09/01/18 0648 09/02/18 0444 09/03/18 0849 09/04/18  0510 09/05/18 0649  NA 136 136 135 137 138  K 5.7* 5.0 3.8 5.3* 5.3*  CL 92* 95* 94* 96* 95*  CO2 24 26 24 27 25   GLUCOSE 230* 122* 95 100* 91  BUN 104* 53* 44* 51* 79*  CREATININE 10.92* 7.20* 5.40* 7.00* 9.11*  CALCIUM 8.9 8.9 8.7* 9.2 9.4  PHOS 7.0* 5.6* 3.3 5.1* 5.7*   GFR: Estimated Creatinine Clearance: 6.8 mL/min (A) (by C-G formula based on SCr of 9.11 mg/dL (H)). Liver Function Tests: Recent Labs  Lab 09/01/18 5102 09/02/18 0444 09/03/18 0849 09/04/18 0510 09/05/18 0649  ALBUMIN 2.6* 2.8* 3.1* 2.9* 3.0*   No results for input(s): LIPASE, AMYLASE in the last 168 hours. No results for input(s): AMMONIA in the last 168 hours. Coagulation Profile: No results for input(s): INR, PROTIME in the last 168 hours. Cardiac Enzymes: No results for input(s): CKTOTAL, CKMB, CKMBINDEX, TROPONINI in the last 168 hours. BNP (last 3 results) No results for input(s): PROBNP in the last 8760 hours. HbA1C: No results for input(s): HGBA1C in the last 72 hours. CBG: Recent Labs  Lab 09/04/18 0605 09/04/18 1123 09/04/18 1613 09/04/18 2049 09/05/18 0752  GLUCAP 101* 181* 181* 161* 133*   Lipid Profile: No results for input(s): CHOL, HDL, LDLCALC, TRIG, CHOLHDL, LDLDIRECT in the last 72 hours. Thyroid Function Tests: No results for input(s): TSH, T4TOTAL, FREET4, T3FREE, THYROIDAB in the last 72 hours. Anemia Panel: No results for input(s): VITAMINB12, FOLATE, FERRITIN, TIBC, IRON, RETICCTPCT in the last 72 hours. Sepsis Labs: No results for input(s): PROCALCITON, LATICACIDVEN in the last 168 hours.  Recent Results (from the past 240 hour(s))  SARS Coronavirus 2 [facility] order, Performed in [place] hospital lab)     Status: Abnormal   Collection Time: 08/30/18  5:51 AM   Specimen: Nasopharyngeal Swab  Result Value Ref Range Status   SARS Coronavirus 2 POSITIVE (A) NEGATIVE Final    Comment: CRITICAL RESULT CALLED TO, READ BACK BY AND VERIFIED WITH: RN Alphia Moh [id number]  AT 824 AM BY CM (NOTE) If result is NEGATIVE SARS-CoV-2 target nucleic acids are NOT DETECTED. The SARS-CoV-2 RNA is generally detectable in upper and lower  respiratory specimens during the acute phase of infection. The lowest  concentration of SARS-CoV-2 viral copies this assay can detect is 250  copies / mL. A negative result does not preclude SARS-CoV-2 infection  and should not be used as the sole basis for treatment or other  patient management decisions.  A negative result may occur with  improper specimen collection / handling, submission of specimen other  than nasopharyngeal swab, presence of viral mutation(s) within the  areas targeted by this assay, and inadequate number of viral copies  (<250 copies / mL). A negative result must be combined with clinical  observations, patient history, and epidemiological information. If result is POSITIVE SARS-CoV-2 target nucleic acids are DE TECTED. The SARS-CoV-2 RNA is generally detectable in upper and lower  respiratory specimens during the acute phase of infection.  Positive  results are indicative of active infection with SARS-CoV-2.  Clinical  correlation with patient history and other diagnostic information is  necessary to determine patient infection status.  Positive  results do  not rule out bacterial infection or co-infection with other viruses. If result is PRESUMPTIVE POSTIVE SARS-CoV-2 nucleic acids MAY BE PRESENT.   A presumptive positive result was obtained on the submitted specimen  and confirmed on repeat testing.  While 2019 novel coronavirus  (SARS-CoV-2) nucleic acids may be present in the submitted sample  additional confirmatory testing may be necessary for epidemiological  and / or clinical management purposes  to differentiate between  SARS-CoV-2 and other Sarbecovirus currently known to infect humans.  If clinically indicated additional testing with an alternate test  methodology (L [id number]) is advised. The  SARS-CoV-2 RNA is generally  detectable in upper and lower respiratory specimens during the acute  phase of infection. The expected result is Negative. Fact Sheet for Patients:  StrictlyIdeas.no Fact Sheet for Healthcare Providers: BankingDealers.co.za This test is not yet approved or cleared by the [place] FDA and has been authorized for detection and/or diagnosis of SARS-CoV-2 by FDA under an Emergency Use Authorization (EUA).  This EUA will remain in effect (meaning this test can be used) for the duration of the COVID-19 declaration under Section 564(b)(1) of the Act, 21 U.S.C. section 360bbb-3(b)(1), unless the authorization is terminated or revoked sooner. Performed at [facility], [place] [address], [place],  [address]   SARS Coronavirus 2 (CEPHEID - Performed in [facility] hospital lab), Hosp Order     Status: Abnormal   Collection Time: 09/03/18  1:41 PM   Specimen: Nasopharyngeal Swab  Result Value Ref Range Status   SARS Coronavirus 2 POSITIVE (A) NEGATIVE Final    Comment: CRITICAL VALUE NOTED.  VALUE IS CONSISTENT WITH PREVIOUSLY REPORTED AND CALLED VALUE. (NOTE) If result is NEGATIVE SARS-CoV-2 target nucleic acids are NOT DETECTED. The SARS-CoV-2 RNA is generally detectable in upper and lower  respiratory specimens during the acute phase of infection. The lowest  concentration of SARS-CoV-2 viral copies this assay can detect is 250  copies / mL. A negative result does not preclude SARS-CoV-2 infection  and should not be used as the sole basis for treatment or other  patient management decisions.  A negative result may occur with  improper specimen collection / handling, submission of specimen other  than nasopharyngeal swab, presence of viral mutation(s) within the  areas targeted by this assay, and inadequate number of viral copies  (<250 copies / mL). A negative result must be combined with clinical   observations, patient history, and epidemiological information. If result is POSITIVE SARS-CoV-2 target nucleic acids are DETECTED. Th e SARS-CoV-2 RNA is generally detectable in upper and lower  respiratory specimens during the acute phase of infection.  Positive  results are indicative of active infection with SARS-CoV-2.  Clinical  correlation with patient history and other diagnostic information is  necessary to determine patient infection status.  Positive results do  not rule out bacterial infection or co-infection with other viruses. If result is PRESUMPTIVE POSTIVE SARS-CoV-2 nucleic acids MAY BE PRESENT.   A presumptive positive result was obtained on the submitted specimen  and confirmed on repeat testing.  While 2019 novel coronavirus  (SARS-CoV-2) nucleic acids may be present in the submitted sample  additional confirmatory testing may be necessary for epidemiological  and / or clinical management purposes  to differentiate between  SARS-CoV-2 and other Sarbecovirus currently known to infect humans.  If clinically indicated additional testing with an alternate test  methodology [phone]) is  advised. The SARS-CoV-2 RNA is generally  detectable in upper  and lower respiratory specimens during the acute  phase of infection. The expected result is Negative. Fact Sheet for Patients:  StrictlyIdeas.no Fact Sheet for Healthcare Providers: BankingDealers.co.za This test is not yet approved or cleared by the [place] FDA and has been authorized for detection and/or diagnosis of SARS-CoV-2 by FDA under an Emergency Use Authorization (EUA).  This EUA will remain in effect (meaning this test can be used) for the duration of the COVID-19 declaration under Section 564(b)(1) of the Act, 21 U.S.C. section 360bbb-3(b)(1), unless the authorization is terminated or revoked sooner. Performed at [facility], [place] [address]  [place], [place] [address]          Radiology Studies: No results found.      Scheduled Meds: . amiodarone  400 mg Oral Daily  . aspirin EC  81 mg Oral Daily  . atorvastatin  80 mg Oral q1800  . calcium acetate  2,001 mg Oral TID WC  . Chlorhexidine Gluconate Cloth  6 each Topical Q0600  . Chlorhexidine Gluconate Cloth  6 each Topical Q0600  . darbepoetin (ARANESP) injection - DIALYSIS  60 mcg Intravenous Q Wed-HD  . feeding supplement (NEPRO CARB STEADY)  237 mL Oral BID BM  . FLUoxetine  20 mg Oral Daily  . heparin  5,000 Units Subcutaneous Q8H  . insulin aspart  0-5 Units Subcutaneous QHS  . insulin aspart  0-9 Units Subcutaneous TID WC  . insulin aspart  5 Units Subcutaneous TID WC  . insulin glargine  45 Units Subcutaneous Daily  . levothyroxine  50 mcg Oral Q0600  . mouth rinse  15 mL Mouth Rinse BID  . methylPREDNISolone (SOLU-MEDROL) injection  40 mg Intravenous Q12H  . midodrine  10 mg Oral TID WC  . multivitamin  1 tablet Oral QHS  . pantoprazole  40 mg Oral Daily  . polyethylene glycol  17 g Oral Daily  . sodium zirconium cyclosilicate  10 g Oral BID WC  . vitamin C  500 mg Oral Daily  . zinc sulfate  220 mg Oral Daily   Continuous Infusions: . sodium chloride    . sodium chloride    . albumin human    . albumin human       LOS: 13 days    Time spent: 34 minutes.     Hosie Poisson, MD [facility] Pager [phone]  If 7PM-7AM, please contact night-coverage [email or web address] Password [email or web address] 09/05/2018, 11:14 AM

## 2018-09-05 NOTE — Progress Notes (Signed)
CSW spoke with Pamala Hurry from inpatient rehab. They have signed off on the patient. The only facility that would be able to accept the patient would be [facility] in [place] will speak with the patient about the SNF option.   CSW called and left Terri Piedra, renal navigator a message regarding the patient.   CSW will continue to follow.   Domenic Schwab, MSW, [place]

## 2018-09-06 LAB — CBC
HCT: 44.4 % (ref 36.0–46.0)
Hemoglobin: 15.2 g/dL — ABNORMAL HIGH (ref 12.0–15.0)
MCH: 33.3 pg (ref 26.0–34.0)
MCHC: 34.2 g/dL (ref 30.0–36.0)
MCV: 97.2 fL (ref 80.0–100.0)
Platelets: 157 10*3/uL (ref 150–400)
RBC: 4.57 MIL/uL (ref 3.87–5.11)
RDW: 16.3 % — ABNORMAL HIGH (ref 11.5–15.5)
WBC: 8.7 10*3/uL (ref 4.0–10.5)
nRBC: 3.8 % — ABNORMAL HIGH (ref 0.0–0.2)

## 2018-09-06 LAB — GLUCOSE, CAPILLARY
Glucose-Capillary: 136 mg/dL — ABNORMAL HIGH (ref 70–99)
Glucose-Capillary: 194 mg/dL — ABNORMAL HIGH (ref 70–99)
Glucose-Capillary: 60 mg/dL — ABNORMAL LOW (ref 70–99)
Glucose-Capillary: 189 mg/dL — ABNORMAL HIGH (ref 70–99)
Glucose-Capillary: 134 mg/dL — ABNORMAL HIGH (ref 70–99)

## 2018-09-06 LAB — RENAL FUNCTION PANEL
Albumin: 3.5 g/dL (ref 3.5–5.0)
Anion gap: 15 (ref 5–15)
BUN: 62 mg/dL — ABNORMAL HIGH (ref 8–23)
CO2: 27 mmol/L (ref 22–32)
Calcium: 9.5 mg/dL (ref 8.9–10.3)
Chloride: 95 mmol/L — ABNORMAL LOW (ref 98–111)
Creatinine, Ser: 8.04 mg/dL — ABNORMAL HIGH (ref 0.44–1.00)
GFR calc Af Amer: 6 mL/min — ABNORMAL LOW (ref >60)
GFR calc non Af Amer: 5 mL/min — ABNORMAL LOW (ref >60)
Glucose, Bld: 151 mg/dL — ABNORMAL HIGH (ref 70–99)
Phosphorus: 4.9 mg/dL — ABNORMAL HIGH (ref 2.5–4.6)
Potassium: 5.5 mmol/L — ABNORMAL HIGH (ref 3.5–5.1)
Sodium: 137 mmol/L (ref 135–145)

## 2018-09-06 LAB — LACTATE DEHYDROGENASE: LDH: 333 U/L — ABNORMAL HIGH (ref 98–192)

## 2018-09-06 MED ORDER — PREDNISONE 20 MG PO TABS
60.0000 mg | ORAL_TABLET | Freq: Every day | ORAL | Status: AC
  Administered 2018-09-07 – 2018-09-08 (×2): 60 mg via ORAL
  Filled 2018-09-06 (×2): qty 3

## 2018-09-06 MED ORDER — HEPARIN SODIUM (PORCINE) 1000 UNIT/ML DIALYSIS
1000.0000 [IU] | INTRAMUSCULAR | Status: DC | PRN
  Filled 2018-09-06: qty 1

## 2018-09-06 MED ORDER — SODIUM CHLORIDE 0.9 % IV SOLN: 100.0000 mL | INTRAVENOUS | Status: DC | PRN

## 2018-09-06 MED ORDER — LIDOCAINE HCL (PF) 1 % IJ SOLN: 5.0000 mL | INTRAMUSCULAR | Status: DC | PRN

## 2018-09-06 MED ORDER — LIDOCAINE-PRILOCAINE 2.5-2.5 % EX CREA
1.0000 "application " | TOPICAL_CREAM | CUTANEOUS | Status: DC | PRN
  Filled 2018-09-06: qty 5

## 2018-09-06 MED ORDER — CHLORHEXIDINE GLUCONATE CLOTH 2 % EX PADS
6.0000 | MEDICATED_PAD | Freq: Every day | CUTANEOUS | Status: DC
  Administered 2018-09-06 – 2018-09-07 (×2): 6 via TOPICAL

## 2018-09-06 MED ORDER — DEXTROSE 50 % IV SOLN
INTRAVENOUS | Status: AC
  Administered 2018-09-06: 50 mL
  Filled 2018-09-06: qty 50

## 2018-09-06 MED ORDER — ALTEPLASE 2 MG IJ SOLR: 2.0000 mg | Freq: Once | INTRAMUSCULAR | Status: DC | PRN

## 2018-09-06 MED ORDER — PENTAFLUOROPROP-TETRAFLUOROETH EX AERO: 1.0000 "application " | INHALATION_SPRAY | CUTANEOUS | Status: DC | PRN

## 2018-09-06 MED ORDER — HEPARIN SODIUM (PORCINE) 1000 UNIT/ML DIALYSIS
20.0000 [IU]/kg | INTRAMUSCULAR | Status: DC | PRN
  Filled 2018-09-06: qty 2

## 2018-09-06 NOTE — TOC Initial Note (Signed)
Transition of Care [facility]) - Initial/Assessment Note    Patient Details  Name: Jamie Arias MRN: [id number] Date of Birth: 1955-03-07  Transition of Care [facility]) CM/SW Contact:    Gelene Mink, [place] Phone Number: 09/06/2018, 3:56 PM  Clinical Narrative:                  CSW called the RN and she put the patient on the phone. CSW introduced herself and explained the therapy recommendation. CSW explained that the patient would not be eligible for CIR due to still testing positive for COVID. The patient is agreeable to going to SNF. CSW spoke with the patient about going to [facility] in [place] so that she would still be able to receive hemodialysis. [facility] is the only facility near [place] that is accepting COVID + patients and they can assist her with getting to her dialysis treatments.   The patient gave permission to the CSW to call and speak with her husband. The patient will need to be clipped.   CSW called the patient's husband Jamie Arias. CSW spoke with the patient's husband about the therapy recommendation and the current plan. CSW provided facility name.   CSW will continue to follow.   Expected Discharge Plan: Skilled Nursing Facility Barriers to Discharge: Continued Medical Work up   Patient Goals and CMS Choice Patient states their goals for this hospitalization and ongoing recovery are:: Pt is agreeable to SNF CMS Medicare.gov Compare Post Acute Care list provided to:: Patient Choice offered to / list presented to : Patient  Expected Discharge Plan and Services Expected Discharge Plan: [place] In-house Referral: Clinical Social Work Discharge Planning Services: NA Post Acute Care Choice: [place] Living arrangements for the past 2 months: Single Family Home                 DME Arranged: N/A DME Agency: NA       HH Arranged: NA [place] Agency: NA        Prior Living Arrangements/Services Living arrangements for  the past 2 months: [place] Lives with:: Spouse Patient language and need for interpreter reviewed:: No Do you feel safe going back to the place where you live?: Yes      Need for Family Participation in Patient Care: No (Comment) Care giver support system in place?: Yes (comment)   Criminal Activity/Legal Involvement Pertinent to Current Situation/Hospitalization: No - Comment as needed  Activities of Daily Living Home Assistive Devices/Equipment: Cane (specify quad or straight), Walker (specify type) ADL Screening (condition at time of admission) Patient's cognitive ability adequate to safely complete daily activities?: Yes Is the patient deaf or have difficulty hearing?: No Does the patient have difficulty seeing, even when wearing glasses/contacts?: No Does the patient have difficulty concentrating, remembering, or making decisions?: No Patient able to express need for assistance with ADLs?: Yes Does the patient have difficulty dressing or bathing?: No Independently performs ADLs?: Yes (appropriate for developmental age) Does the patient have difficulty walking or climbing stairs?: No Weakness of Legs: None Weakness of Arms/Hands: None  Permission Sought/Granted Permission sought to share information with : Case Manager Permission granted to share information with : Yes, Verbal Permission Granted  Share Information with NAME: Jamie Arias  Permission granted to share info w AGENCY: All SNF  Permission granted to share info w Relationship: Spouse     Emotional Assessment Appearance:: Appears stated age Attitude/Demeanor/Rapport: Unable to Assess Affect (typically observed): Unable to Assess  Orientation: : Oriented to Self, Oriented to Place, Oriented to  Time, Oriented to Situation Alcohol / Substance Use: Not Applicable Psych Involvement: No (comment)  Admission diagnosis:  COVID-19 POSITIVE WEAKNESS DIALYSIS Patient Active Problem List   Diagnosis Date Noted   . Adjustment disorder with depressed mood 08/31/2018  . COVID-19 virus infection 08/24/2018  . Acute on chronic respiratory failure with hypoxia ([place]) 08/24/2018  . UTI (urinary tract infection) 08/24/2018  . Allergic rhinitis 09/19/2017  . Uremia of renal origin 09/04/2017  . Chronic neck pain 07/25/2017  . Hyperkalemia 02/15/2017  . Complication from renal dialysis device 12/01/2016  . ESRD (end stage renal disease) ([place]) 01/19/2016  . GI bleed 10/25/2015  . Hypertensive heart disease 10/24/2015  . Chronic diastolic CHF (congestive heart failure) ([place]) 10/24/2015  . Unstable angina ([place]) 10/23/2015  . Low BP 09/02/2015  . Chronic systolic CHF (congestive heart failure) ([place]) 09/02/2015  . Depression 07/27/2015  . Calculus of gallbladder with chronic cholecystitis without obstruction   . Bilateral carotid bruits 11/30/2014  . Low magnesium levels 08/10/2013  . Anemia 08/10/2013  . Constipation 08/10/2013  . Postoperative atrial fibrillation ([place]) 07/30/2013  . Coronary artery disease   . CAD (coronary artery disease) 07/02/2013  . Acute systolic heart failure ([place]) 06/30/2013  . NSTEMI (non-ST elevated myocardial infarction) ([place]) 06/29/2013  . Vertigo 08/25/2012  . Sleep disorder 05/23/2012  . Hypothyroid 04/27/2012  . HTN (hypertension) 04/27/2012  . HLD (hyperlipidemia) 04/27/2012  . Diabetes mellitus, type 2 ([place]) 04/27/2012  . Neuropathy 04/27/2012   PCP:  [name], MD Pharmacy:   [facility] [address] [place] - [place] [address] [place] [address] Phone: [phone] Fax: [phone]  [facility], [place] - [address] [place] [address] Phone: [phone] Fax: [phone]     Social Determinants of Health (SDOH) Interventions    Readmission Risk Interventions No flowsheet data found.

## 2018-09-06 NOTE — Progress Notes (Signed)
PROGRESS NOTE    Jamie Arias  CBS:[id number] DOB: 01/25/1956 DOA: 08/23/2018 PCP: [name], MD   Brief Narrative:   63 y.o.femalewith medical history significant ofESRD on HDMWF,anemia, chronic diastolic congestive heart failure, CAD status post PCI, type 2 diabetes, hypertension, hyperlipidemia, hypothyroidism, and conditions listed belowpresenting as a transfer from [facility]. COVID-19positive. Patient transferred to [facility] for management of COVID-19 and hemodialysis.  No new complaints, discussed the plan with the patient husband and the patient.    Assessment & Plan:   Principal Problem:   COVID-19 virus infection Active Problems:   HTN (hypertension)   ESRD (end stage renal disease) (HCC)   Acute on chronic respiratory failure with hypoxia (HCC)   UTI (urinary tract infection)   Adjustment disorder with depressed mood   Hyperkalemia secondary to end-stage renal disease. Persistent hyperkalemia despite lokelma, plan for HD today.  Patient is on dialysis Monday Wednesday Friday. Further management as per nephrology.    Newly diagnosed atrial fibrillation Rate controlled and appreciate cardiology recommendations. Episodes of bradycardia  Decreased the dose of  amiodarone to 400 mg daily.  Defer to cardiology for starting anticoagulation. As per cardiology no anti coagulation given elevated risk for bleeding from COVID 19 Complications.     Depression Patient was started on Prozac 20 mg daily and trazodone. Further management as per psychiatry as outpatient.   Persistent generalized weakness/deconditioning PT OT recommended CIR Patient unable to be admitted to CIR due to positive COVID-19. Plan for SNF who will take COVID 19 positive patients.    Hypothyroidism Follow-up with TSH in 4 to 6 weeks.   Intractable nausea and vomiting.  Resolved   Acute respiratory failure secondary to COVID-19 related pneumonia Patient continues  to require 2 L of nasal cannula oxygen to keep oxygen sats greater than 95%. Slowly taper steroids.   Type 2 diabetes with hyperglycemia well controll Continue with sliding scale insulin CBG (last 3)  Recent Labs    09/05/18 2126 09/06/18 0744 09/06/18 1208  GLUCAP 148* 136* 194*  resume lantus.  Encourage oral intake.    DVT prophylaxis: (Heparin Code Status: Full code Family Communication: None at bedside, will call her son and update  Disposition Plan: Pending COVID 19- testing.  Consultants:   Psychiatry, nephrology  Procedures: None  Antimicrobials: None  Subjective: No new complaints.   Objective: Vitals:   09/05/18 2133 09/05/18 2137 09/06/18 0613 09/06/18 0749  BP: (!) 181/49 (!) 150/53 (!) 144/54 (!) 139/42  Pulse:    (!) 51  Resp: 20  17   Temp: 98.2 F (36.8 C)  97.9 F (36.6 C) 98.1 F (36.7 C)  TempSrc: Oral  Oral Oral  SpO2: 96%  98% 100%  Weight:      Height:       No intake or output data in the 24 hours ending 09/06/18 1215 Filed Weights   09/05/18 0600 09/05/18 0733 09/05/18 1045  Weight: 90.1 kg 92.2 kg 90.2 kg    Examination:  General exam: calm and comfortable, off oxygen. Respiratory system: air entry fair, no wheezing or rhonchi.  Cardiovascular system: S1 & S2 heard, RRR. No JVD, Gastrointestinal system: Abdomen is soft NT ND BS+ Central nervous system: Alert and oriented. Grossly non focal.  Extremities: no cyanosis or clubbing.  Skin: No rashes, lesions or ulcers Psychiatry:  Mood & affect appropriate.     Data Reviewed: I have personally reviewed following labs and imaging studies  CBC: Recent Labs  Lab 09/01/18 [phone]  09/03/18 0832 09/04/18 0510  WBC 11.0* 13.3* 12.7*  HGB 10.3* 11.7* 11.0*  HCT 30.8* 35.3* 33.1*  MCV 96.9 97.0 97.1  PLT 262 285 883   Basic Metabolic Panel: Recent Labs  Lab 09/02/18 0444 09/03/18 0849 09/04/18 0510 09/05/18 0649 09/06/18 0514  NA 136 135 137 138 137  K 5.0 3.8 5.3*  5.3* 5.5*  CL 95* 94* 96* 95* 95*  CO2 26 24 27 25 27   GLUCOSE 122* 95 100* 91 151*  BUN 53* 44* 51* 79* 62*  CREATININE 7.20* 5.40* 7.00* 9.11* 8.04*  CALCIUM 8.9 8.7* 9.2 9.4 9.5  PHOS 5.6* 3.3 5.1* 5.7* 4.9*   GFR: Estimated Creatinine Clearance: 7.7 mL/min (A) (by C-G formula based on SCr of 8.04 mg/dL (H)). Liver Function Tests: Recent Labs  Lab 09/02/18 0444 09/03/18 0849 09/04/18 0510 09/05/18 0649 09/06/18 0514  ALBUMIN 2.8* 3.1* 2.9* 3.0* 3.5   No results for input(s): LIPASE, AMYLASE in the last 168 hours. No results for input(s): AMMONIA in the last 168 hours. Coagulation Profile: No results for input(s): INR, PROTIME in the last 168 hours. Cardiac Enzymes: No results for input(s): CKTOTAL, CKMB, CKMBINDEX, TROPONINI in the last 168 hours. BNP (last 3 results) No results for input(s): PROBNP in the last 8760 hours. HbA1C: No results for input(s): HGBA1C in the last 72 hours. CBG: Recent Labs  Lab 09/05/18 1152 09/05/18 1818 09/05/18 2126 09/06/18 0744 09/06/18 1208  GLUCAP 90 131* 148* 136* 194*   Lipid Profile: No results for input(s): CHOL, HDL, LDLCALC, TRIG, CHOLHDL, LDLDIRECT in the last 72 hours. Thyroid Function Tests: No results for input(s): TSH, T4TOTAL, FREET4, T3FREE, THYROIDAB in the last 72 hours. Anemia Panel: No results for input(s): VITAMINB12, FOLATE, FERRITIN, TIBC, IRON, RETICCTPCT in the last 72 hours. Sepsis Labs: No results for input(s): PROCALCITON, LATICACIDVEN in the last 168 hours.  Recent Results (from the past 240 hour(s))  SARS Coronavirus 2 [facility] order, Performed in [place] hospital lab)     Status: Abnormal   Collection Time: 08/30/18  5:51 AM   Specimen: Nasopharyngeal Swab  Result Value Ref Range Status   SARS Coronavirus 2 POSITIVE (A) NEGATIVE Final    Comment: CRITICAL RESULT CALLED TO, READ BACK BY AND VERIFIED WITH: RN Alphia Moh [id number] AT 641 AM BY CM (NOTE) If result is NEGATIVE SARS-CoV-2 target  nucleic acids are NOT DETECTED. The SARS-CoV-2 RNA is generally detectable in upper and lower  respiratory specimens during the acute phase of infection. The lowest  concentration of SARS-CoV-2 viral copies this assay can detect is 250  copies / mL. A negative result does not preclude SARS-CoV-2 infection  and should not be used as the sole basis for treatment or other  patient management decisions.  A negative result may occur with  improper specimen collection / handling, submission of specimen other  than nasopharyngeal swab, presence of viral mutation(s) within the  areas targeted by this assay, and inadequate number of viral copies  (<250 copies / mL). A negative result must be combined with clinical  observations, patient history, and epidemiological information. If result is POSITIVE SARS-CoV-2 target nucleic acids are DE TECTED. The SARS-CoV-2 RNA is generally detectable in upper and lower  respiratory specimens during the acute phase of infection.  Positive  results are indicative of active infection with SARS-CoV-2.  Clinical  correlation with patient history and other diagnostic information is  necessary to determine patient infection status.  Positive results do  not rule out bacterial  infection or co-infection with other viruses. If result is PRESUMPTIVE POSTIVE SARS-CoV-2 nucleic acids MAY BE PRESENT.   A presumptive positive result was obtained on the submitted specimen  and confirmed on repeat testing.  While 2019 novel coronavirus  (SARS-CoV-2) nucleic acids may be present in the submitted sample  additional confirmatory testing may be necessary for epidemiological  and / or clinical management purposes  to differentiate between  SARS-CoV-2 and other Sarbecovirus currently known to infect humans.  If clinically indicated additional testing with an alternate test  methodology (L [id number]) is advised. The SARS-CoV-2 RNA is generally  detectable in upper and lower  respiratory specimens during the acute  phase of infection. The expected result is Negative. Fact Sheet for Patients:  StrictlyIdeas.no Fact Sheet for Healthcare Providers: BankingDealers.co.za This test is not yet approved or cleared by the [place] FDA and has been authorized for detection and/or diagnosis of SARS-CoV-2 by FDA under an Emergency Use Authorization (EUA).  This EUA will remain in effect (meaning this test can be used) for the duration of the COVID-19 declaration under Section 564(b)(1) of the Act, 21 U.S.C. section 360bbb-3(b)(1), unless the authorization is terminated or revoked sooner. Performed at [facility], [place] [address], [place], [place] [address]   SARS Coronavirus 2 (CEPHEID - Performed in [facility] hospital lab), Hosp Order     Status: Abnormal   Collection Time: 09/03/18  1:41 PM   Specimen: Nasopharyngeal Swab  Result Value Ref Range Status   SARS Coronavirus 2 POSITIVE (A) NEGATIVE Final    Comment: CRITICAL VALUE NOTED.  VALUE IS CONSISTENT WITH PREVIOUSLY REPORTED AND CALLED VALUE. (NOTE) If result is NEGATIVE SARS-CoV-2 target nucleic acids are NOT DETECTED. The SARS-CoV-2 RNA is generally detectable in upper and lower  respiratory specimens during the acute phase of infection. The lowest  concentration of SARS-CoV-2 viral copies this assay can detect is 250  copies / mL. A negative result does not preclude SARS-CoV-2 infection  and should not be used as the sole basis for treatment or other  patient management decisions.  A negative result may occur with  improper specimen collection / handling, submission of specimen other  than nasopharyngeal swab, presence of viral mutation(s) within the  areas targeted by this assay, and inadequate number of viral copies  (<250 copies / mL). A negative result must be combined with clinical  observations, patient history, and epidemiological  information. If result is POSITIVE SARS-CoV-2 target nucleic acids are DETECTED. Th e SARS-CoV-2 RNA is generally detectable in upper and lower  respiratory specimens during the acute phase of infection.  Positive  results are indicative of active infection with SARS-CoV-2.  Clinical  correlation with patient history and other diagnostic information is  necessary to determine patient infection status.  Positive results do  not rule out bacterial infection or co-infection with other viruses. If result is PRESUMPTIVE POSTIVE SARS-CoV-2 nucleic acids MAY BE PRESENT.   A presumptive positive result was obtained on the submitted specimen  and confirmed on repeat testing.  While 2019 novel coronavirus  (SARS-CoV-2) nucleic acids may be present in the submitted sample  additional confirmatory testing may be necessary for epidemiological  and / or clinical management purposes  to differentiate between  SARS-CoV-2 and other Sarbecovirus currently known to infect humans.  If clinically indicated additional testing with an alternate test  methodology [phone]) is  advised. The SARS-CoV-2 RNA is generally  detectable in upper and lower respiratory specimens during the acute  phase of infection. The expected result is Negative. Fact Sheet for Patients:  StrictlyIdeas.no Fact Sheet for Healthcare Providers: BankingDealers.co.za This test is not yet approved or cleared by the [place] FDA and has been authorized for detection and/or diagnosis of SARS-CoV-2 by FDA under an Emergency Use Authorization (EUA).  This EUA will remain in effect (meaning this test can be used) for the duration of the COVID-19 declaration under Section 564(b)(1) of the Act, 21 U.S.C. section 360bbb-3(b)(1), unless the authorization is terminated or revoked sooner. Performed at [facility], [place] [address] [place], [place] [address]          Radiology  Studies: No results found.      Scheduled Meds: . amiodarone  400 mg Oral Daily  . aspirin EC  81 mg Oral Daily  . atorvastatin  80 mg Oral q1800  . calcium acetate  2,001 mg Oral TID WC  . Chlorhexidine Gluconate Cloth  6 each Topical Q0600  . Chlorhexidine Gluconate Cloth  6 each Topical Q0600  . Chlorhexidine Gluconate Cloth  6 each Topical Q0600  . darbepoetin (ARANESP) injection - DIALYSIS  60 mcg Intravenous Q Wed-HD  . feeding supplement (NEPRO CARB STEADY)  237 mL Oral BID BM  . FLUoxetine  20 mg Oral Daily  . heparin  5,000 Units Subcutaneous Q8H  . insulin aspart  0-5 Units Subcutaneous QHS  . insulin aspart  0-9 Units Subcutaneous TID WC  . insulin aspart  5 Units Subcutaneous TID WC  . insulin glargine  45 Units Subcutaneous Daily  . levothyroxine  50 mcg Oral Q0600  . mouth rinse  15 mL Mouth Rinse BID  . methylPREDNISolone (SOLU-MEDROL) injection  40 mg Intravenous Q12H  . midodrine  10 mg Oral TID WC  . multivitamin  1 tablet Oral QHS  . pantoprazole  40 mg Oral Daily  . polyethylene glycol  17 g Oral Daily  . vitamin C  500 mg Oral Daily  . zinc sulfate  220 mg Oral Daily   Continuous Infusions: . sodium chloride    . sodium chloride    . albumin human    . albumin human       LOS: 14 days    Time spent: 30 minutes.     Hosie Poisson, MD [facility] Pager [phone]  If 7PM-7AM, please contact night-coverage [email or web address] Password [email or web address] 09/06/2018, 12:15 PM

## 2018-09-06 NOTE — Progress Notes (Signed)
CSW attempted to call the patient's room to complete the assessment. The patient did not answer. CSW is unable to go to the patient's room due to being on COVID precautions.   CSW called the unit and asked to speak with the RN. Nurse secretary stated the RN would call her back.   CSW will attempt to complete the assessment later.   Domenic Schwab, MSW, [place]

## 2018-09-06 NOTE — NC FL2 (Signed)
[place] LEVEL OF CARE SCREENING TOOL     IDENTIFICATION  Patient Name: Jamie Arias Birthdate: 09-30-1955 Sex: female Admission Date (Current Location): 08/23/2018  [facility] and [place] Number:  Herbalist and Address:  The . [facility], [place] [address] [place], [place] [address]      Provider Number: [id number]  Attending Physician Name and Address:  Hosie Poisson, MD  Relative Name and Phone Number:  Mary[place], [place], [phone]    Current Level of Care: Hospital Recommended Level of Care: [place] Prior Approval Number:    Date Approved/Denied:   PASRR Number: [id number]  Discharge Plan: SNF    Current Diagnoses: Patient Active Problem List   Diagnosis Date Noted  . Adjustment disorder with depressed mood 08/31/2018  . COVID-19 virus infection 08/24/2018  . Acute on chronic respiratory failure with hypoxia ([place]) 08/24/2018  . UTI (urinary tract infection) 08/24/2018  . Allergic rhinitis 09/19/2017  . Uremia of renal origin 09/04/2017  . Chronic neck pain 07/25/2017  . Hyperkalemia 02/15/2017  . Complication from renal dialysis device 12/01/2016  . ESRD (end stage renal disease) ([place]) 01/19/2016  . GI bleed 10/25/2015  . Hypertensive heart disease 10/24/2015  . Chronic diastolic CHF (congestive heart failure) ([place]) 10/24/2015  . Unstable angina ([place]) 10/23/2015  . Low BP 09/02/2015  . Chronic systolic CHF (congestive heart failure) ([place]) 09/02/2015  . Depression 07/27/2015  . Calculus of gallbladder with chronic cholecystitis without obstruction   . Bilateral carotid bruits 11/30/2014  . Low magnesium levels 08/10/2013  . Anemia 08/10/2013  . Constipation 08/10/2013  . Postoperative atrial fibrillation ([place]) 07/30/2013  . Coronary artery disease   . CAD (coronary artery disease) 07/02/2013  . Acute systolic heart failure ([place]) 06/30/2013  . NSTEMI (non-ST elevated myocardial infarction)  ([place]) 06/29/2013  . Vertigo 08/25/2012  . Sleep disorder 05/23/2012  . Hypothyroid 04/27/2012  . HTN (hypertension) 04/27/2012  . HLD (hyperlipidemia) 04/27/2012  . Diabetes mellitus, type 2 ([place]) 04/27/2012  . Neuropathy 04/27/2012    Orientation RESPIRATION BLADDER Height & Weight     Self, Time, Situation, Place    Continent, External catheter Weight: 197 lb 5 oz (89.5 kg) Height:  5\' 4"  (162.6 cm)  BEHAVIORAL SYMPTOMS/MOOD NEUROLOGICAL BOWEL NUTRITION STATUS      Continent Diet(Renal, thin liquids, fluid restricition 1563ml)  AMBULATORY STATUS COMMUNICATION OF NEEDS Skin   Limited Assist   Bruising(on arm/abdomen)                       Personal Care Assistance Level of Assistance  Bathing, Feeding, Dressing, Total care Bathing Assistance: Limited assistance Feeding assistance: Independent Dressing Assistance: Limited assistance Total Care Assistance: Limited assistance   Functional Limitations Info  Sight, Hearing, Speech Sight Info: Adequate Hearing Info: Adequate Speech Info: Adequate    SPECIAL CARE FACTORS FREQUENCY  PT (By licensed PT), OT (By licensed OT)     PT Frequency: 5x/wk OT Frequency: 5x/wk            Contractures Contractures Info: Not present    Additional Factors Info  Code Status, Allergies, Insulin Sliding Scale, Isolation Precautions Code Status Info: Full Code Allergies Info: Nsaids, Doxycycline   Insulin Sliding Scale Info: insulin aspart novolog 0-5 units at bedtime; insulin aspart novolog 0-9 units 3X daily w/meals; insulin aspart novolog 5 units 3x daily w/meals; insulin glargine (Lantus) 45 units daily Isolation Precautions Info: COVID +     Current  Medications (09/06/2018):  This is the current hospital active medication list Current Facility-Administered Medications  Medication Dose Route Frequency Provider Last Rate Last Dose  . 0.9 %  sodium chloride infusion  100 mL Intravenous PRN Rosita Fire, MD      . 0.9 %   sodium chloride infusion  100 mL Intravenous PRN Rosita Fire, MD      . 0.9 %  sodium chloride infusion  100 mL Intravenous PRN Rosita Fire, MD      . 0.9 %  sodium chloride infusion  100 mL Intravenous PRN Rosita Fire, MD      . 0.9 %  sodium chloride infusion  100 mL Intravenous PRN Rosita Fire, MD      . 0.9 %  sodium chloride infusion  100 mL Intravenous PRN Rosita Fire, MD      . acetaminophen (TYLENOL) tablet 650 mg  650 mg Oral Q6H PRN Donne Hazel, MD   650 mg at 09/06/18 0933  . albumin human 25 % solution 25 g  25 g Intravenous Once Rosita Fire, MD      . albumin human 5 % solution 25 g  25 g Intravenous Once Rosita Fire, MD      . alteplase (CATHFLO ACTIVASE) injection 2 mg  2 mg Intracatheter Once PRN Rosita Fire, MD      . alteplase (CATHFLO ACTIVASE) injection 2 mg  2 mg Intracatheter Once PRN Rosita Fire, MD      . alteplase (CATHFLO ACTIVASE) injection 2 mg  2 mg Intracatheter Once PRN Rosita Fire, MD      . amiodarone (PACERONE) tablet 400 mg  400 mg Oral Daily Hosie Poisson, MD   400 mg at 09/06/18 0939  . aspirin EC tablet 81 mg  81 mg Oral Daily Donne Hazel, MD   81 mg at 09/06/18 6073  . atorvastatin (LIPITOR) tablet 80 mg  80 mg Oral q1800 Donne Hazel, MD   80 mg at 09/05/18 1824  . calcium acetate (PHOSLO) capsule 2,001 mg  2,001 mg Oral TID WC Roney Jaffe, MD   2,001 mg at 09/06/18 7106  . Chlorhexidine Gluconate Cloth 2 % PADS 6 each  6 each Topical Q0600 Rosita Fire, MD   6 each at 09/05/18 [phone]  . Chlorhexidine Gluconate Cloth 2 % PADS 6 each  6 each Topical Q0600 Rosita Fire, MD   6 each at 09/06/18 [phone]  . Chlorhexidine Gluconate Cloth 2 % PADS 6 each  6 each Topical Q0600 Rosita Fire, MD      . Darbepoetin Alfa (ARANESP) injection 60 mcg  60 mcg Intravenous Q Wed-HD Claudia Desanctis, MD   60 mcg at 08/27/18 1604  . feeding  supplement (NEPRO CARB STEADY) liquid 237 mL  237 mL Oral BID BM Alric Seton, PA-C 0 mL/hr at 08/29/18 0157 237 mL at 09/04/18 1406  . FLUoxetine (PROZAC) capsule 20 mg  20 mg Oral Daily Irene Pap N, DO   20 mg at 09/06/18 2703  . heparin injection 1,000 Units  1,000 Units Dialysis PRN Rosita Fire, MD      . heparin injection 1,000 Units  1,000 Units Dialysis PRN Rosita Fire, MD      . heparin injection 1,000 Units  1,000 Units Dialysis PRN Rosita Fire, MD      . heparin injection 1,800 Units  20 Units/kg Dialysis PRN Lawson Radar  Reesa Chew, MD      . heparin injection 1,900 Units  20 Units/kg Dialysis PRN Rosita Fire, MD      . heparin injection 5,000 Units  5,000 Units Subcutaneous Q8H Shela Leff, MD   5,000 Units at 09/06/18 0615  . insulin aspart (novoLOG) injection 0-5 Units  0-5 Units Subcutaneous QHS Shela Leff, MD   3 Units at 08/31/18 2250  . insulin aspart (novoLOG) injection 0-9 Units  0-9 Units Subcutaneous TID WC Shela Leff, MD   2 Units at 09/06/18 1256  . insulin aspart (novoLOG) injection 5 Units  5 Units Subcutaneous TID WC Irene Pap N, DO   5 Units at 09/06/18 1257  . insulin glargine (LANTUS) injection 45 Units  45 Units Subcutaneous Daily Donne Hazel, MD   45 Units at 09/06/18 [phone]  . levothyroxine (SYNTHROID) tablet 50 mcg  50 mcg Oral Q0600 Kayleen Memos, DO   50 mcg at 09/06/18 0093  . lidocaine (PF) (XYLOCAINE) 1 % injection 5 mL  5 mL Intradermal PRN Rosita Fire, MD      . lidocaine (PF) (XYLOCAINE) 1 % injection 5 mL  5 mL Intradermal PRN Rosita Fire, MD      . lidocaine (PF) (XYLOCAINE) 1 % injection 5 mL  5 mL Intradermal PRN Rosita Fire, MD      . lidocaine-prilocaine (EMLA) cream 1 application  1 application Topical PRN Rosita Fire, MD      . lidocaine-prilocaine (EMLA) cream 1 application  1 application Topical PRN Rosita Fire, MD      .  lidocaine-prilocaine (EMLA) cream 1 application  1 application Topical PRN Rosita Fire, MD      . MEDLINE mouth rinse  15 mL Mouth Rinse BID Shela Leff, MD   15 mL at 09/05/18 2144  . midodrine (PROAMATINE) tablet 10 mg  10 mg Oral TID WC Rosita Fire, MD   10 mg at 09/06/18 1258  . multivitamin (RENA-VIT) tablet 1 tablet  1 tablet Oral QHS Alric Seton, PA-C   1 tablet at 09/05/18 2138  . ondansetron (ZOFRAN) injection 4 mg  4 mg Intravenous Q6H PRN Irene Pap N, DO   4 mg at 09/03/18 1855  . oxyCODONE (Oxy IR/ROXICODONE) immediate release tablet 5 mg  5 mg Oral Q6H PRN Irene Pap N, DO   5 mg at 09/03/18 1855  . pantoprazole (PROTONIX) EC tablet 40 mg  40 mg Oral Daily Donne Hazel, MD   40 mg at 09/06/18 8182  . pentafluoroprop-tetrafluoroeth (GEBAUERS) aerosol 1 application  1 application Topical PRN Rosita Fire, MD      . pentafluoroprop-tetrafluoroeth Landry Dyke) aerosol 1 application  1 application Topical PRN Rosita Fire, MD      . pentafluoroprop-tetrafluoroeth Landry Dyke) aerosol 1 application  1 application Topical PRN Rosita Fire, MD      . polyethylene glycol [facility] Raveling) packet 17 g  17 g Oral Daily Irene Pap N, DO   17 g at 09/06/18 9937  . [START ON 09/07/2018] predniSONE (DELTASONE) tablet 60 mg  60 mg Oral QAC breakfast Hosie Poisson, MD      . traZODone (DESYREL) tablet 50 mg  50 mg Oral QHS PRN Irene Pap N, DO      . vitamin C (ASCORBIC ACID) tablet 500 mg  500 mg Oral Daily Donne Hazel, MD   500 mg at 09/06/18 0939  . zinc sulfate capsule 220 mg  220 mg Oral Daily Donne Hazel, MD   220 mg at 09/06/18 2353     Discharge Medications: Please see discharge summary for a list of discharge medications.  Relevant Imaging Results:  Relevant Lab Results:   Additional Information SSN: [id number]  Philippa Chester Noretta Frier, LCSWA

## 2018-09-06 NOTE — Progress Notes (Addendum)
[facility] PROGRESS NOTE  Assessment/ Plan: Pt is a 63 y.o. yo female with ESRD on HD MWF transferred from [place] for generalized weakness secondary due to COVID-19.  # ESRD MWF:  -C had dialysis yesterday with about 2 L ultrafiltration.  The potassium level is still elevated at 5.5 today.  Probably has poor clearance because up on 3 hours treatment.  Plan for next HD today for mild hyperkalemia.  Discussed with the patient's nurse. - Per prior chart, the outpatient [facility] has been arranged at [facility], MWF third shift if is still COVID positive when she is medically stable to discharge.  # Anemia of chronic kidney disease: Continue Aranesp.  Hemoglobin acceptable.  # Secondary hyperparathyroidism: Monitor phosphorus level. Continue PhosLo.   # Hypotension/volume: On midodrine for hypotension. Monitor BP.  #COVID-19 positive: Per primary team, on  steroid  #Altered mental status: Minimizing sedatives.  Primary team is following and seen by psychiatrist for the depression.  Discussed with the patient's nurse and Dr. Karleen Hampshire.  Subjective:  Due to the nature of this patient's suspected COVID-19 with isolation and in keeping with efforts to prevent the spread of infection and to conserve personal protective equipment, a physical exam was not personally performed.  Patient's symptoms and exam were discussed in detail with the RN.  A chart review of other providers notes and the patient's lab work as well as review of other pertinent studies was performed.  Exam details from prior documentation were reviewed specifically and confirmed with the bedside nurse.  Location of service: [facility]. Objective Vital signs in last 24 hours: Vitals:   09/05/18 2133 09/05/18 2137 09/06/18 0613 09/06/18 0749  BP: (!) 181/49 (!) 150/53 (!) 144/54 (!) 139/42  Pulse:    (!) 51  Resp: 20  17   Temp: 98.2 F (36.8 C)  97.9 F (36.6 C) 98.1 F (36.7 C)  TempSrc: Oral   Oral Oral  SpO2: 96%  98% 100%  Weight:      Height:       Weight change: 2.11 kg  Intake/Output Summary (Last 24 hours) at 09/06/2018 1012 Last data filed at 09/05/2018 1045 Gross per 24 hour  Intake -  Output 2000 ml  Net -2000 ml       Labs: Basic Metabolic Panel: Recent Labs  Lab 09/04/18 0510 09/05/18 0649 09/06/18 0514  NA 137 138 137  K 5.3* 5.3* 5.5*  CL 96* 95* 95*  CO2 27 25 27   GLUCOSE 100* 91 151*  BUN 51* 79* 62*  CREATININE 7.00* 9.11* 8.04*  CALCIUM 9.2 9.4 9.5  PHOS 5.1* 5.7* 4.9*   Liver Function Tests: Recent Labs  Lab 09/04/18 0510 09/05/18 0649 09/06/18 0514  ALBUMIN 2.9* 3.0* 3.5   No results for input(s): LIPASE, AMYLASE in the last 168 hours. No results for input(s): AMMONIA in the last 168 hours. CBC: Recent Labs  Lab 09/01/18 0648 09/03/18 0832 09/04/18 0510  WBC 11.0* 13.3* 12.7*  HGB 10.3* 11.7* 11.0*  HCT 30.8* 35.3* 33.1*  MCV 96.9 97.0 97.1  PLT 262 285 249   Cardiac Enzymes: No results for input(s): CKTOTAL, CKMB, CKMBINDEX, TROPONINI in the last 168 hours. CBG: Recent Labs  Lab 09/05/18 0752 09/05/18 1152 09/05/18 1818 09/05/18 2126 09/06/18 0744  GLUCAP 133* 90 131* 148* 136*    Iron Studies: No results for input(s): IRON, TIBC, TRANSFERRIN, FERRITIN in the last 72 hours. Studies/Results: No results found.  Medications: Infusions: . sodium chloride    .  sodium chloride    . albumin human    . albumin human      Scheduled Medications: . amiodarone  400 mg Oral Daily  . aspirin EC  81 mg Oral Daily  . atorvastatin  80 mg Oral q1800  . calcium acetate  2,001 mg Oral TID WC  . Chlorhexidine Gluconate Cloth  6 each Topical Q0600  . Chlorhexidine Gluconate Cloth  6 each Topical Q0600  . Chlorhexidine Gluconate Cloth  6 each Topical Q0600  . darbepoetin (ARANESP) injection - DIALYSIS  60 mcg Intravenous Q Wed-HD  . feeding supplement (NEPRO CARB STEADY)  237 mL Oral BID BM  . FLUoxetine  20 mg Oral  Daily  . heparin  5,000 Units Subcutaneous Q8H  . insulin aspart  0-5 Units Subcutaneous QHS  . insulin aspart  0-9 Units Subcutaneous TID WC  . insulin aspart  5 Units Subcutaneous TID WC  . insulin glargine  45 Units Subcutaneous Daily  . levothyroxine  50 mcg Oral Q0600  . mouth rinse  15 mL Mouth Rinse BID  . methylPREDNISolone (SOLU-MEDROL) injection  40 mg Intravenous Q12H  . midodrine  10 mg Oral TID WC  . multivitamin  1 tablet Oral QHS  . pantoprazole  40 mg Oral Daily  . polyethylene glycol  17 g Oral Daily  . vitamin C  500 mg Oral Daily  . zinc sulfate  220 mg Oral Daily    have reviewed scheduled and prn medications.   Hesper Venturella Prasad Rieley Hausman 09/06/2018,10:12 AM  LOS: 14 days  Pager: [phone]

## 2018-09-07 LAB — RENAL FUNCTION PANEL
Albumin: 3.4 g/dL — ABNORMAL LOW (ref 3.5–5.0)
Anion gap: 20 — ABNORMAL HIGH (ref 5–15)
BUN: 53 mg/dL — ABNORMAL HIGH (ref 8–23)
CO2: 19 mmol/L — ABNORMAL LOW (ref 22–32)
Calcium: 9.3 mg/dL (ref 8.9–10.3)
Chloride: 98 mmol/L (ref 98–111)
Creatinine, Ser: 7.27 mg/dL — ABNORMAL HIGH (ref 0.44–1.00)
GFR calc Af Amer: 6 mL/min — ABNORMAL LOW (ref >60)
GFR calc non Af Amer: 5 mL/min — ABNORMAL LOW (ref >60)
Glucose, Bld: 75 mg/dL (ref 70–99)
Phosphorus: 3.2 mg/dL (ref 2.5–4.6)
Potassium: 5.9 mmol/L — ABNORMAL HIGH (ref 3.5–5.1)
Sodium: 137 mmol/L (ref 135–145)

## 2018-09-07 LAB — GLUCOSE, CAPILLARY
Glucose-Capillary: 97 mg/dL (ref 70–99)
Glucose-Capillary: 135 mg/dL — ABNORMAL HIGH (ref 70–99)
Glucose-Capillary: 151 mg/dL — ABNORMAL HIGH (ref 70–99)
Glucose-Capillary: 137 mg/dL — ABNORMAL HIGH (ref 70–99)
Glucose-Capillary: 105 mg/dL — ABNORMAL HIGH (ref 70–99)

## 2018-09-07 LAB — LACTATE DEHYDROGENASE: LDH: 563 U/L — ABNORMAL HIGH (ref 98–192)

## 2018-09-07 LAB — POTASSIUM: Potassium: 6.3 mmol/L (ref 3.5–5.1)

## 2018-09-07 MED ORDER — SODIUM BICARBONATE 8.4 % IV SOLN
100.0000 meq | INTRAVENOUS | Status: AC
  Administered 2018-09-07: 100 meq via INTRAVENOUS
  Filled 2018-09-07: qty 50

## 2018-09-07 MED ORDER — CHLORHEXIDINE GLUCONATE CLOTH 2 % EX PADS
6.0000 | MEDICATED_PAD | Freq: Every day | CUTANEOUS | Status: DC
  Administered 2018-09-07 – 2018-09-08 (×2): 6 via TOPICAL

## 2018-09-07 MED ORDER — SODIUM BICARBONATE 8.4 % IV SOLN
INTRAVENOUS | Status: AC
  Filled 2018-09-07: qty 50

## 2018-09-07 MED ORDER — SODIUM ZIRCONIUM CYCLOSILICATE 10 G PO PACK
10.0000 g | PACK | Freq: Two times a day (BID) | ORAL | Status: DC
  Administered 2018-09-07 (×2): 10 g via ORAL
  Filled 2018-09-07 (×3): qty 1

## 2018-09-07 NOTE — Progress Notes (Signed)
PROGRESS NOTE    ANGLE DIRUSSO  AVW:[id number] DOB: December 12, 1955 DOA: 08/23/2018 PCP: [name], MD   Brief Narrative:   63 y.o.femalewith medical history significant ofESRD on HDMWF,anemia, chronic diastolic congestive heart failure, CAD status post PCI, type 2 diabetes, hypertension, hyperlipidemia, hypothyroidism, and conditions listed belowpresenting as a transfer from [facility]. COVID-19positive. Patient transferred to [facility] for management of COVID-19 and hemodialysis.  No new complaints, discussed the plan with the patient husband and the patient.    Assessment & Plan:   Principal Problem:   COVID-19 virus infection Active Problems:   HTN (hypertension)   ESRD (end stage renal disease) (HCC)   Acute on chronic respiratory failure with hypoxia (HCC)   UTI (urinary tract infection)   Adjustment disorder with depressed mood   Hyperkalemia secondary to end-stage renal disease. Persistent hyperkalemia despite lokelma, plan for HD today.  Patient is on dialysis Monday Wednesday Friday.HD today.  Further management as per nephrology.    Newly diagnosed atrial fibrillation Rate controlled and appreciate cardiology recommendations. Episodes of bradycardia , but patient asymptomatic.  Decreased the dose of  amiodarone to 400 mg daily.  Defer to cardiology for starting anticoagulation. As per cardiology no anti coagulation given elevated risk for bleeding from COVID 19 Complications.     Depression Patient was started on Prozac 20 mg daily and trazodone. Further management as per psychiatry as outpatient.   Persistent generalized weakness/deconditioning PT OT recommended CIR Patient unable to be admitted to CIR due to positive COVID-19. Plan for SNF who will take COVID 19 positive patients.    Hypothyroidism Follow-up with TSH in 4 to 6 weeks.   Intractable nausea and vomiting.  Resolved   Acute respiratory failure secondary to COVID-19  related pneumonia Weaned off oxygen. She is on RA with good sats.  Slowly taper steroids.   Type 2 diabetes with hyperglycemia well controll Continue with sliding scale insulin CBG (last 3)  Recent Labs    09/06/18 2118 09/07/18 0841 09/07/18 1150  GLUCAP 134* 97 135*  resume lantus.  Encourage oral intake.    DVT prophylaxis: (Heparin Code Status: Full code Family Communication: None at bedside, discussed with husband over the phone.  Disposition Plan: Pending COVID 19- testing.  Consultants:   Psychiatry, nephrology  Procedures: None  Antimicrobials: None  Subjective: Alert and not in any distress. No chest pain or sob.   Objective: Vitals:   09/06/18 1946 09/07/18 0500 09/07/18 0615 09/07/18 0843  BP: (!) 118/53 (!) 116/54  (!) 103/52  Pulse:  79  66  Resp: 18 16  17   Temp: 98.2 F (36.8 C)     TempSrc: Oral     SpO2: 99% 96%    Weight:   90.7 kg   Height:       No intake or output data in the 24 hours ending 09/07/18 1503 Filed Weights   09/06/18 1120 09/06/18 1430 09/07/18 0615  Weight: 90.3 kg 89.5 kg 90.7 kg    Examination:  General exam: calm and comfortable, off oxygen. Respiratory system: air entry fair, no wheezing or rhonchi.  Cardiovascular system: S1 & S2 heard, RRR. No JVD, Gastrointestinal system: Abdomen is soft NT ND BS+ Central nervous system: Alert and oriented. Grossly non focal.  Extremities: no cyanosis or clubbing.  Skin: No rashes, lesions or ulcers Psychiatry:  Mood & affect appropriate.     Data Reviewed: I have personally reviewed following labs and imaging studies  CBC: Recent Labs  Lab 09/01/18  3220 09/03/18 0832 09/04/18 0510 09/06/18 1145  WBC 11.0* 13.3* 12.7* 8.7  HGB 10.3* 11.7* 11.0* 15.2*  HCT 30.8* 35.3* 33.1* 44.4  MCV 96.9 97.0 97.1 97.2  PLT 262 285 249 254   Basic Metabolic Panel: Recent Labs  Lab 09/03/18 0849 09/04/18 0510 09/05/18 0649 09/06/18 0514 09/07/18 0854  NA 135 137 138 137  137  K 3.8 5.3* 5.3* 5.5* 5.9*  CL 94* 96* 95* 95* 98  CO2 24 27 25 27  19*  GLUCOSE 95 100* 91 151* 75  BUN 44* 51* 79* 62* 53*  CREATININE 5.40* 7.00* 9.11* 8.04* 7.27*  CALCIUM 8.7* 9.2 9.4 9.5 9.3  PHOS 3.3 5.1* 5.7* 4.9* 3.2   GFR: Estimated Creatinine Clearance: 8.5 mL/min (A) (by C-G formula based on SCr of 7.27 mg/dL (H)). Liver Function Tests: Recent Labs  Lab 09/03/18 0849 09/04/18 0510 09/05/18 0649 09/06/18 0514 09/07/18 0854  ALBUMIN 3.1* 2.9* 3.0* 3.5 3.4*   No results for input(s): LIPASE, AMYLASE in the last 168 hours. No results for input(s): AMMONIA in the last 168 hours. Coagulation Profile: No results for input(s): INR, PROTIME in the last 168 hours. Cardiac Enzymes: No results for input(s): CKTOTAL, CKMB, CKMBINDEX, TROPONINI in the last 168 hours. BNP (last 3 results) No results for input(s): PROBNP in the last 8760 hours. HbA1C: No results for input(s): HGBA1C in the last 72 hours. CBG: Recent Labs  Lab 09/06/18 1633 09/06/18 1804 09/06/18 2118 09/07/18 0841 09/07/18 1150  GLUCAP 60* 189* 134* 97 135*   Lipid Profile: No results for input(s): CHOL, HDL, LDLCALC, TRIG, CHOLHDL, LDLDIRECT in the last 72 hours. Thyroid Function Tests: No results for input(s): TSH, T4TOTAL, FREET4, T3FREE, THYROIDAB in the last 72 hours. Anemia Panel: No results for input(s): VITAMINB12, FOLATE, FERRITIN, TIBC, IRON, RETICCTPCT in the last 72 hours. Sepsis Labs: No results for input(s): PROCALCITON, LATICACIDVEN in the last 168 hours.  Recent Results (from the past 240 hour(s))  SARS Coronavirus 2 [facility] order, Performed in [place] hospital lab)     Status: Abnormal   Collection Time: 08/30/18  5:51 AM   Specimen: Nasopharyngeal Swab  Result Value Ref Range Status   SARS Coronavirus 2 POSITIVE (A) NEGATIVE Final    Comment: CRITICAL RESULT CALLED TO, READ BACK BY AND VERIFIED WITH: RN Alphia Moh [id number] AT 762 AM BY CM (NOTE) If result is NEGATIVE  SARS-CoV-2 target nucleic acids are NOT DETECTED. The SARS-CoV-2 RNA is generally detectable in upper and lower  respiratory specimens during the acute phase of infection. The lowest  concentration of SARS-CoV-2 viral copies this assay can detect is 250  copies / mL. A negative result does not preclude SARS-CoV-2 infection  and should not be used as the sole basis for treatment or other  patient management decisions.  A negative result may occur with  improper specimen collection / handling, submission of specimen other  than nasopharyngeal swab, presence of viral mutation(s) within the  areas targeted by this assay, and inadequate number of viral copies  (<250 copies / mL). A negative result must be combined with clinical  observations, patient history, and epidemiological information. If result is POSITIVE SARS-CoV-2 target nucleic acids are DE TECTED. The SARS-CoV-2 RNA is generally detectable in upper and lower  respiratory specimens during the acute phase of infection.  Positive  results are indicative of active infection with SARS-CoV-2.  Clinical  correlation with patient history and other diagnostic information is  necessary to determine patient infection status.  Positive results do  not rule out bacterial infection or co-infection with other viruses. If result is PRESUMPTIVE POSTIVE SARS-CoV-2 nucleic acids MAY BE PRESENT.   A presumptive positive result was obtained on the submitted specimen  and confirmed on repeat testing.  While 2019 novel coronavirus  (SARS-CoV-2) nucleic acids may be present in the submitted sample  additional confirmatory testing may be necessary for epidemiological  and / or clinical management purposes  to differentiate between  SARS-CoV-2 and other Sarbecovirus currently known to infect humans.  If clinically indicated additional testing with an alternate test  methodology (L [id number]) is advised. The SARS-CoV-2 RNA is generally  detectable in upper  and lower respiratory specimens during the acute  phase of infection. The expected result is Negative. Fact Sheet for Patients:  StrictlyIdeas.no Fact Sheet for Healthcare Providers: BankingDealers.co.za This test is not yet approved or cleared by the [place] FDA and has been authorized for detection and/or diagnosis of SARS-CoV-2 by FDA under an Emergency Use Authorization (EUA).  This EUA will remain in effect (meaning this test can be used) for the duration of the COVID-19 declaration under Section 564(b)(1) of the Act, 21 U.S.C. section 360bbb-3(b)(1), unless the authorization is terminated or revoked sooner. Performed at [facility], [place] [address], [place], [place] [address]   SARS Coronavirus 2 (CEPHEID - Performed in [facility] hospital lab), Hosp Order     Status: Abnormal   Collection Time: 09/03/18  1:41 PM   Specimen: Nasopharyngeal Swab  Result Value Ref Range Status   SARS Coronavirus 2 POSITIVE (A) NEGATIVE Final    Comment: CRITICAL VALUE NOTED.  VALUE IS CONSISTENT WITH PREVIOUSLY REPORTED AND CALLED VALUE. (NOTE) If result is NEGATIVE SARS-CoV-2 target nucleic acids are NOT DETECTED. The SARS-CoV-2 RNA is generally detectable in upper and lower  respiratory specimens during the acute phase of infection. The lowest  concentration of SARS-CoV-2 viral copies this assay can detect is 250  copies / mL. A negative result does not preclude SARS-CoV-2 infection  and should not be used as the sole basis for treatment or other  patient management decisions.  A negative result may occur with  improper specimen collection / handling, submission of specimen other  than nasopharyngeal swab, presence of viral mutation(s) within the  areas targeted by this assay, and inadequate number of viral copies  (<250 copies / mL). A negative result must be combined with clinical  observations, patient history, and epidemiological  information. If result is POSITIVE SARS-CoV-2 target nucleic acids are DETECTED. Th e SARS-CoV-2 RNA is generally detectable in upper and lower  respiratory specimens during the acute phase of infection.  Positive  results are indicative of active infection with SARS-CoV-2.  Clinical  correlation with patient history and other diagnostic information is  necessary to determine patient infection status.  Positive results do  not rule out bacterial infection or co-infection with other viruses. If result is PRESUMPTIVE POSTIVE SARS-CoV-2 nucleic acids MAY BE PRESENT.   A presumptive positive result was obtained on the submitted specimen  and confirmed on repeat testing.  While 2019 novel coronavirus  (SARS-CoV-2) nucleic acids may be present in the submitted sample  additional confirmatory testing may be necessary for epidemiological  and / or clinical management purposes  to differentiate between  SARS-CoV-2 and other Sarbecovirus currently known to infect humans.  If clinically indicated additional testing with an alternate test  methodology [phone]) is  advised. The SARS-CoV-2 RNA is generally  detectable in  upper and lower respiratory specimens during the acute  phase of infection. The expected result is Negative. Fact Sheet for Patients:  StrictlyIdeas.no Fact Sheet for Healthcare Providers: BankingDealers.co.za This test is not yet approved or cleared by the [place] FDA and has been authorized for detection and/or diagnosis of SARS-CoV-2 by FDA under an Emergency Use Authorization (EUA).  This EUA will remain in effect (meaning this test can be used) for the duration of the COVID-19 declaration under Section 564(b)(1) of the Act, 21 U.S.C. section 360bbb-3(b)(1), unless the authorization is terminated or revoked sooner. Performed at [facility], [place] [address] [place], [place] [address]          Radiology  Studies: No results found.      Scheduled Meds: . amiodarone  400 mg Oral Daily  . aspirin EC  81 mg Oral Daily  . atorvastatin  80 mg Oral q1800  . calcium acetate  2,001 mg Oral TID WC  . Chlorhexidine Gluconate Cloth  6 each Topical Q0600  . Chlorhexidine Gluconate Cloth  6 each Topical Q0600  . darbepoetin (ARANESP) injection - DIALYSIS  60 mcg Intravenous Q Wed-HD  . feeding supplement (NEPRO CARB STEADY)  237 mL Oral BID BM  . FLUoxetine  20 mg Oral Daily  . heparin  5,000 Units Subcutaneous Q8H  . insulin aspart  0-5 Units Subcutaneous QHS  . insulin aspart  0-9 Units Subcutaneous TID WC  . insulin aspart  5 Units Subcutaneous TID WC  . insulin glargine  45 Units Subcutaneous Daily  . levothyroxine  50 mcg Oral Q0600  . mouth rinse  15 mL Mouth Rinse BID  . midodrine  10 mg Oral TID WC  . multivitamin  1 tablet Oral QHS  . pantoprazole  40 mg Oral Daily  . polyethylene glycol  17 g Oral Daily  . predniSONE  60 mg Oral QAC breakfast  . sodium bicarbonate  100 mEq Intravenous NOW  . sodium zirconium cyclosilicate  10 g Oral BID  . vitamin C  500 mg Oral Daily  . zinc sulfate  220 mg Oral Daily   Continuous Infusions: . sodium chloride    . sodium chloride    . sodium chloride    . sodium chloride    . sodium chloride    . sodium chloride    . albumin human    . albumin human       LOS: 15 days    Time spent: 30 minutes.     Hosie Poisson, MD [facility] Pager [phone]  If 7PM-7AM, please contact night-coverage [email or web address] Password [email or web address] 09/07/2018, 3:03 PM

## 2018-09-07 NOTE — Progress Notes (Signed)
Jamie Arias  Assessment/ Plan: Pt is a 63 y.o. yo female with ESRD on HD MWF transferred from [place] for generalized weakness secondary due to COVID-19.  # ESRD MWF:  -Patient had dialysis on Friday and yesterday because of hyperkalemia.  Potassium level is still elevated to 5.9.  Order Milly Jakob.  Recommend low potassium diet and dietitian was consulted.  May have poor clearance.  Order dialysis for tomorrow with OPTi flux and variable potassium bath.  I will check  potassium level this afternoon.  If no improvement then she will need dialysis even today. - Per prior chart, the outpatient [facility] has been arranged [facility] MWF third shift if is still COVID positive when she is medically stable to discharge.  # Anemia of chronic kidney disease: Continue Aranesp.  Hemoglobin level high noted yesterday.  Repeat lab in the morning and discontinue Aranesp if hemoglobin remains above goal.    # Secondary hyperparathyroidism: Monitor phosphorus level. Continue PhosLo.   # Hypotension/volume: On midodrine for hypotension. Monitor BP.  #COVID-19 positive: Per primary team, on  steroid  #Altered mental status: Minimizing sedatives.  Primary team is following and seen by psychiatrist for the depression.  Discussed with the patient's nurse and Dr. Karleen Hampshire.  Subjective:  Due to the nature of this patient's positive COVID-19 with isolation and in keeping with efforts to prevent the spread of infection and to conserve personal protective equipment, a physical exam was not personally performed.  Patient's symptoms and exam were discussed in detail with the RN.  A chart review of other providers notes and the patient's lab work as well as review of other pertinent studies was performed.  Exam details from prior documentation were reviewed specifically and confirmed with the bedside nurse.  Location of service: [facility]. Objective Vital signs in last 24  hours: Vitals:   09/06/18 1946 09/07/18 0500 09/07/18 0615 09/07/18 0843  BP: (!) 118/53 (!) 116/54  (!) 103/52  Pulse:  79  66  Resp: 18 16  17   Temp: 98.2 F (36.8 C)     TempSrc: Oral     SpO2: 99% 96%    Weight:   90.7 kg   Height:       Weight change: -1.9 kg  Intake/Output Summary (Last 24 hours) at 09/07/2018 1143 Last data filed at 09/06/2018 1430 Gross per 24 hour  Intake -  Output 800 ml  Net -800 ml       Labs: Basic Metabolic Panel: Recent Labs  Lab 09/05/18 0649 09/06/18 0514 09/07/18 0854  NA 138 137 137  K 5.3* 5.5* 5.9*  CL 95* 95* 98  CO2 25 27 19*  GLUCOSE 91 151* 75  BUN 79* 62* 53*  CREATININE 9.11* 8.04* 7.27*  CALCIUM 9.4 9.5 9.3  PHOS 5.7* 4.9* 3.2   Liver Function Tests: Recent Labs  Lab 09/05/18 0649 09/06/18 0514 09/07/18 0854  ALBUMIN 3.0* 3.5 3.4*   No results for input(s): LIPASE, AMYLASE in the last 168 hours. No results for input(s): AMMONIA in the last 168 hours. CBC: Recent Labs  Lab 09/01/18 0648 09/03/18 0832 09/04/18 0510 09/06/18 1145  WBC 11.0* 13.3* 12.7* 8.7  HGB 10.3* 11.7* 11.0* 15.2*  HCT 30.8* 35.3* 33.1* 44.4  MCV 96.9 97.0 97.1 97.2  PLT 262 285 249 157   Cardiac Enzymes: No results for input(s): CKTOTAL, CKMB, CKMBINDEX, TROPONINI in the last 168 hours. CBG: Recent Labs  Lab 09/06/18 1208 09/06/18 1633 09/06/18  1804 09/06/18 2118 09/07/18 0841  GLUCAP 194* 60* 189* 134* 97    Iron Studies: No results for input(s): IRON, TIBC, TRANSFERRIN, FERRITIN in the last 72 hours. Studies/Results: No results found.  Medications: Infusions: . sodium chloride    . sodium chloride    . sodium chloride    . sodium chloride    . sodium chloride    . sodium chloride    . albumin human    . albumin human      Scheduled Medications: . amiodarone  400 mg Oral Daily  . aspirin EC  81 mg Oral Daily  . atorvastatin  80 mg Oral q1800  . calcium acetate  2,001 mg Oral TID WC  . Chlorhexidine Gluconate  Cloth  6 each Topical Q0600  . Chlorhexidine Gluconate Cloth  6 each Topical Q0600  . darbepoetin (ARANESP) injection - DIALYSIS  60 mcg Intravenous Q Wed-HD  . feeding supplement (NEPRO CARB STEADY)  237 mL Oral BID BM  . FLUoxetine  20 mg Oral Daily  . heparin  5,000 Units Subcutaneous Q8H  . insulin aspart  0-5 Units Subcutaneous QHS  . insulin aspart  0-9 Units Subcutaneous TID WC  . insulin aspart  5 Units Subcutaneous TID WC  . insulin glargine  45 Units Subcutaneous Daily  . levothyroxine  50 mcg Oral Q0600  . mouth rinse  15 mL Mouth Rinse BID  . midodrine  10 mg Oral TID WC  . multivitamin  1 tablet Oral QHS  . pantoprazole  40 mg Oral Daily  . polyethylene glycol  17 g Oral Daily  . predniSONE  60 mg Oral QAC breakfast  . sodium zirconium cyclosilicate  10 g Oral BID  . vitamin C  500 mg Oral Daily  . zinc sulfate  220 mg Oral Daily    have reviewed scheduled and prn medications.   Dron Prasad Bhandari 09/07/2018,11:43 AM  LOS: 15 days  Pager: [phone]

## 2018-09-08 LAB — LACTATE DEHYDROGENASE: LDH: 222 U/L — ABNORMAL HIGH (ref 98–192)

## 2018-09-08 LAB — CBC
HCT: 36.3 % (ref 36.0–46.0)
Hemoglobin: 12 g/dL (ref 12.0–15.0)
MCH: 33.3 pg (ref 26.0–34.0)
MCHC: 33.1 g/dL (ref 30.0–36.0)
MCV: 100.8 fL — ABNORMAL HIGH (ref 80.0–100.0)
Platelets: 161 10*3/uL (ref 150–400)
RBC: 3.6 MIL/uL — ABNORMAL LOW (ref 3.87–5.11)
RDW: 17.9 % — ABNORMAL HIGH (ref 11.5–15.5)
WBC: 13.3 10*3/uL — ABNORMAL HIGH (ref 4.0–10.5)
nRBC: 0.2 % (ref 0.0–0.2)

## 2018-09-08 LAB — RENAL FUNCTION PANEL
Albumin: 3.1 g/dL — ABNORMAL LOW (ref 3.5–5.0)
Anion gap: 20 — ABNORMAL HIGH (ref 5–15)
BUN: 74 mg/dL — ABNORMAL HIGH (ref 8–23)
CO2: 27 mmol/L (ref 22–32)
Calcium: 9.3 mg/dL (ref 8.9–10.3)
Chloride: 95 mmol/L — ABNORMAL LOW (ref 98–111)
Creatinine, Ser: 9.87 mg/dL — ABNORMAL HIGH (ref 0.44–1.00)
GFR calc Af Amer: 4 mL/min — ABNORMAL LOW (ref >60)
GFR calc non Af Amer: 4 mL/min — ABNORMAL LOW (ref >60)
Glucose, Bld: 54 mg/dL — ABNORMAL LOW (ref 70–99)
Phosphorus: 5.1 mg/dL — ABNORMAL HIGH (ref 2.5–4.6)
Potassium: 4 mmol/L (ref 3.5–5.1)
Sodium: 142 mmol/L (ref 135–145)

## 2018-09-08 LAB — GLUCOSE, CAPILLARY
Glucose-Capillary: 60 mg/dL — ABNORMAL LOW (ref 70–99)
Glucose-Capillary: 141 mg/dL — ABNORMAL HIGH (ref 70–99)
Glucose-Capillary: 130 mg/dL — ABNORMAL HIGH (ref 70–99)
Glucose-Capillary: 131 mg/dL — ABNORMAL HIGH (ref 70–99)
Glucose-Capillary: 138 mg/dL — ABNORMAL HIGH (ref 70–99)

## 2018-09-08 LAB — SARS CORONAVIRUS 2 BY RT PCR (HOSPITAL ORDER, PERFORMED IN ~~LOC~~ HOSPITAL LAB): SARS Coronavirus 2: POSITIVE — AB

## 2018-09-08 MED ORDER — HEPARIN SODIUM (PORCINE) 1000 UNIT/ML DIALYSIS
1000.0000 [IU] | INTRAMUSCULAR | Status: DC | PRN
  Filled 2018-09-08: qty 1

## 2018-09-08 MED ORDER — LIDOCAINE HCL (PF) 1 % IJ SOLN: 5.0000 mL | INTRAMUSCULAR | Status: DC | PRN

## 2018-09-08 MED ORDER — PRO-STAT SUGAR FREE PO LIQD
30.0000 mL | Freq: Three times a day (TID) | ORAL | Status: DC
  Administered 2018-09-08 – 2018-09-14 (×17): 30 mL via ORAL
  Filled 2018-09-08 (×18): qty 30

## 2018-09-08 MED ORDER — CHLORHEXIDINE GLUCONATE CLOTH 2 % EX PADS
6.0000 | MEDICATED_PAD | Freq: Every day | CUTANEOUS | Status: DC
  Administered 2018-09-09 – 2018-09-15 (×5): 6 via TOPICAL

## 2018-09-08 MED ORDER — INSULIN GLARGINE 100 UNIT/ML ~~LOC~~ SOLN: 35.0000 [IU] | Freq: Every day | SUBCUTANEOUS | Status: DC

## 2018-09-08 MED ORDER — LIDOCAINE-PRILOCAINE 2.5-2.5 % EX CREA
1.0000 "application " | TOPICAL_CREAM | CUTANEOUS | Status: DC | PRN
  Filled 2018-09-08: qty 5

## 2018-09-08 MED ORDER — SODIUM CHLORIDE 0.9 % IV SOLN: 100.0000 mL | INTRAVENOUS | Status: DC | PRN

## 2018-09-08 MED ORDER — INSULIN GLARGINE 100 UNIT/ML ~~LOC~~ SOLN
25.0000 [IU] | Freq: Every day | SUBCUTANEOUS | Status: DC
  Administered 2018-09-09 – 2018-09-10 (×2): 25 [IU] via SUBCUTANEOUS
  Filled 2018-09-08 (×2): qty 0.25

## 2018-09-08 MED ORDER — BOOST / RESOURCE BREEZE PO LIQD CUSTOM
1.0000 | Freq: Three times a day (TID) | ORAL | Status: DC
  Administered 2018-09-08: 1 via ORAL
  Administered 2018-09-08: 16:00:00 via ORAL
  Administered 2018-09-09 – 2018-09-14 (×12): 1 via ORAL
  Administered 2018-09-14: 22:00:00 via ORAL

## 2018-09-08 MED ORDER — DEXTROSE 50 % IV SOLN
INTRAVENOUS | Status: AC
  Administered 2018-09-08: 50 mL
  Filled 2018-09-08: qty 50

## 2018-09-08 MED ORDER — LIDOCAINE-PRILOCAINE 2.5-2.5 % EX CREA: 1.0000 "application " | TOPICAL_CREAM | CUTANEOUS | Status: DC | PRN

## 2018-09-08 MED ORDER — ALTEPLASE 2 MG IJ SOLR: 2.0000 mg | Freq: Once | INTRAMUSCULAR | Status: DC | PRN

## 2018-09-08 MED ORDER — PENTAFLUOROPROP-TETRAFLUOROETH EX AERO: 1.0000 "application " | INHALATION_SPRAY | CUTANEOUS | Status: DC | PRN

## 2018-09-08 NOTE — Progress Notes (Signed)
Renal Navigator has left messages with [employer] Admissions Coordinator and [facility] OP HD clinic  to follow up on whether patient has been accepted to COVID positive shift at [facility] for OP HD treatment at discharge. Renal Navigator to follow closely and follow up with CSW once seat has been confirmed.  Alphonzo Cruise, [place] Renal Navigator  [phone]

## 2018-09-08 NOTE — Progress Notes (Signed)
Physical Therapy Treatment Patient Details Name: Jamie Arias MRN: [id number] DOB: 1955/08/06 Today's Date: 09/08/2018    History of Present Illness PRISCILLA KIRSTEIN is a 63 y.o. female with medical history significant of ESRD on HD MWF, anemia, chronic diastolic congestive heart failure, CAD status post PCI, type 2 diabetes, hypertension, hyperlipidemia, hypothyroidism, and conditions listed below presenting as a transfer from [facility].  COVID-19 positive.  Patient transferred to [facility] for management of COVID-19 and hemodialysis.     PT Comments    Pt continues to have limited progress. Pt fatigues very easily. Continue to recommend SNF.    Follow Up Recommendations  SNF     Equipment Recommendations  Rolling walker with 5" wheels;3in1 (PT)    Recommendations for Other Services       Precautions / Restrictions Precautions Precautions: Fall Restrictions Weight Bearing Restrictions: No    Mobility  Bed Mobility Overal bed mobility: Needs Assistance Bed Mobility: Supine to Sit;Sit to Supine     Supine to sit: Max assist;HOB elevated Sit to supine: Mod assist;HOB elevated   General bed mobility comments: Assist to bring legs off of bed, elevate trunk into sitting and bring hips to EOB. Assist to lower trunk and bring legs back up into bed.   Transfers                 General transfer comment: Unable to attempt. Pt closing eyes and leaning over.  Ambulation/Gait                 Stairs             Wheelchair Mobility    Modified Rankin (Stroke Patients Only)       Balance Overall balance assessment: Needs assistance Sitting-balance support: Feet supported;Bilateral upper extremity supported Sitting balance-Leahy Scale: Poor Sitting balance - Comments: Sat EOB x 10 minutes with min assist. Pt fatigues quickly and begins leaning anteriorly or rt lateral lean Postural control: Posterior lean;Right lateral lean                                  Cognition Arousal/Alertness: Lethargic Behavior During Therapy: Flat affect Overall Cognitive Status: Impaired/Different from baseline Area of Impairment: Attention;Following commands;Awareness;Problem solving                   Current Attention Level: Sustained Memory: Decreased short-term memory Following Commands: Follows one step commands inconsistently;Follows one step commands with increased time   Awareness: Intellectual Problem Solving: Slow processing;Decreased initiation;Requires verbal cues;Requires tactile cues General Comments: Pt with eyes closed most of session. Responds to questions but back quickly with eyes closed      Exercises      General Comments General comments (skin integrity, edema, etc.): Pt had removed O2 cannula. SpO2 on RA 95%.      Pertinent Vitals/Pain Pain Assessment: No/denies pain    Home Living                      Prior Function            PT Goals (current goals can now be found in the care plan section) Acute Rehab PT Goals PT Goal Formulation: Patient unable to participate in goal setting Time For Goal Achievement: 09/22/18 Potential to Achieve Goals: Fair Progress towards PT goals: Goals downgraded-see care plan    Frequency    Min 2X/week  PT Plan Current plan remains appropriate;Frequency needs to be updated    Co-evaluation              AM-PAC PT "6 Clicks" Mobility   Outcome Measure  Help needed turning from your back to your side while in a flat bed without using bedrails?: A Lot Help needed moving from lying on your back to sitting on the side of a flat bed without using bedrails?: A Lot Help needed moving to and from a bed to a chair (including a wheelchair)?: Total Help needed standing up from a chair using your arms (e.g., wheelchair or bedside chair)?: Total Help needed to walk in hospital room?: Total Help needed climbing 3-5 steps with a railing? : Total 6  Click Score: 8    End of Session   Activity Tolerance: Patient limited by fatigue Patient left: in bed;with call bell/phone within reach;with bed alarm set   PT Visit Diagnosis: Unsteadiness on feet (R26.81);Other abnormalities of gait and mobility (R26.89);History of falling (Z91.81);Other symptoms and signs involving the nervous system (V56.433)     Time: 2951-8841 PT Time Calculation (min) (ACUTE ONLY): 25 min  Charges:  $Therapeutic Activity: 23-37 mins                     [place] Pager [phone] Office [place] 09/08/2018, 3:57 PM

## 2018-09-08 NOTE — Progress Notes (Signed)
Chart reviewed. [place] Supervisor [phone]

## 2018-09-08 NOTE — Progress Notes (Signed)
Inpatient Diabetes Program Recommendations  AACE/ADA: New Consensus Statement on Inpatient Glycemic Control (2015)  Target Ranges:  Prepandial:   less than 140 mg/dL      Peak postprandial:   less than 180 mg/dL (1-2 hours)      Critically ill patients:  140 - 180 mg/dL   Results for DENECIA, BRUNETTE (MRN [id number]) as of 09/08/2018 09:25  Ref. Range 09/07/2018 08:41 09/07/2018 11:50 09/07/2018 15:18 09/07/2018 17:42 09/07/2018 21:17  Glucose-Capillary Latest Ref Range: 70 - 99 mg/dL 97 135 (H)    45 units LANTUS 151 (H) 137 (H) 105 (H)   Results for CURTINA, GRILLS (MRN [id number]) as of 09/08/2018 09:25  Ref. Range 09/08/2018 07:45  Glucose-Capillary Latest Ref Range: 70 - 99 mg/dL 60 (L)     Home DM Meds: Basaglar 40 units Daily  Current Orders: Lantus 45 units Daily      Novolog Sensitive Correction Scale/ SSI (0-9 units) TID AC + HS      Novolog 5 units TID with meals      MD- Patient with Hypoglycemia this AM.  No Novolog Insulin given to patient yesterday.  Please consider the following:  1. Reduce Lantus to 35 units Daily  2. Reduce Novolog Meal Coverage to: Novolog 3 units TID with meals  (Please add the following Hold Parameters: Hold if pt eats <50% of meal, Hold if pt NPO)      --Will follow patient during hospitalization--  Wyn Quaker RN, MSN, CDE Diabetes Coordinator Inpatient Glycemic Control Team Team Pager: [phone] (8a-5p)

## 2018-09-08 NOTE — Progress Notes (Signed)
PROGRESS NOTE    Jamie Arias  JQB:[id number] DOB: [birth year] DOA: 08/23/2018 PCP: [name], MD   Brief Narrative:   63 y.o.femalewith medical history significant ofESRD on HDMWF,anemia, chronic diastolic congestive heart failure, CAD status post PCI, type 2 diabetes, hypertension, hyperlipidemia, hypothyroidism, and conditions listed belowpresenting as a transfer from [facility]. COVID-19positive. Patient transferred to [facility] for management of COVID-19 and hemodialysis.  No new complaints, discussed the plan with the patient husband and the patient.    Assessment & Plan:   Principal Problem:   COVID-19 virus infection Active Problems:   HTN (hypertension)   ESRD (end stage renal disease) (HCC)   Acute on chronic respiratory failure with hypoxia (HCC)   UTI (urinary tract infection)   Adjustment disorder with depressed mood   Hyperkalemia secondary to end-stage renal disease. Resolved.  Patient is on dialysis Monday Wednesday Friday.HD today.  Further management as per nephrology.    Newly diagnosed atrial fibrillation Rate controlled and appreciate cardiology recommendations. Episodes of bradycardia , but patient asymptomatic.  Decreased the dose of  amiodarone to 400 mg daily.  Defer to cardiology for starting anticoagulation. As per cardiology no anti coagulation given elevated risk for bleeding from COVID 19 Complications.     Depression Patient was started on Prozac 20 mg daily and trazodone. Further management as per psychiatry as outpatient.   Persistent generalized weakness/deconditioning PT OT recommended CIR Patient unable to be admitted to CIR due to positive COVID-19. Plan for SNF who will take COVID 19 positive patients.    Hypothyroidism Follow-up with TSH in 4 to 6 weeks.   Intractable nausea and vomiting.  Resolved   Acute respiratory failure secondary to COVID-19 related pneumonia Weaned off oxygen. She is on RA  with good sats.  Slowly taper steroids.   Type 2 diabetes with hyperglycemia well controll Continue with sliding scale insulin CBG (last 3)  Recent Labs    09/08/18 0745 09/08/18 0920 09/08/18 1140  GLUCAP 60* 141* 130*    Decrease the lantus to 25 units daily and stop the pre meal coverage.  Continue with SSI.  Discussed with the patient and RN.    DVT prophylaxis: (Heparin Code Status: Full code Family Communication: None at bedside, discussed with husband over the phone on 7/6.  Disposition Plan: Pending COVID 19- testing.  Consultants:   Psychiatry, nephrology  Procedures: None  Antimicrobials: None  Subjective: Flat affect, not in distress, saw her during HD.  Denies any new complaints. Was oriented to place and person only. Did not know the date.   Objective: Vitals:   09/08/18 0930 09/08/18 1000 09/08/18 1030 09/08/18 1039  BP: (!) 86/41 (!) 100/35 (!) 101/41 (!) 104/44  Pulse: 65 64 63 64  Resp: 15 14 15 15   Temp:    98.7 F (37.1 C)  TempSrc:    Axillary  SpO2:  94%  93%  Weight:    89.2 kg  Height:        Intake/Output Summary (Last 24 hours) at 09/08/2018 1418 Last data filed at 09/08/2018 1039 Gross per 24 hour  Intake -  Output 0 ml  Net 0 ml   Filed Weights   09/07/18 0615 09/08/18 0720 09/08/18 1039  Weight: 90.7 kg 89.2 kg 89.2 kg    Examination:  General exam: calm and comfortable, off oxygen. Respiratory system: air entry fair, no wheezing or rhonchi.  Cardiovascular system: S1 & S2 heard, RRR. No JVD, Gastrointestinal system: Abdomen is soft NT ND  BS+ Central nervous system: Alert and oriented to place and person only. Generalized deconditioning.  Extremities: no cyanosis or clubbing.  Skin: No rashes, lesions or ulcers Psychiatry:  Flat affect.     Data Reviewed: I have personally reviewed following labs and imaging studies  CBC: Recent Labs  Lab 09/03/18 0832 09/04/18 0510 09/06/18 1145  WBC 13.3* 12.7* 8.7  HGB  11.7* 11.0* 15.2*  HCT 35.3* 33.1* 44.4  MCV 97.0 97.1 97.2  PLT 285 249 182   Basic Metabolic Panel: Recent Labs  Lab 09/04/18 0510 09/05/18 0649 09/06/18 0514 09/07/18 0854 09/07/18 1626 09/08/18 0500  NA 137 138 137 137  --  142  K 5.3* 5.3* 5.5* 5.9* 6.3* 4.0  CL 96* 95* 95* 98  --  95*  CO2 27 25 27  19*  --  27  GLUCOSE 100* 91 151* 75  --  54*  BUN 51* 79* 62* 53*  --  74*  CREATININE 7.00* 9.11* 8.04* 7.27*  --  9.87*  CALCIUM 9.2 9.4 9.5 9.3  --  9.3  PHOS 5.1* 5.7* 4.9* 3.2  --  5.1*   GFR: Estimated Creatinine Clearance: 6.2 mL/min (A) (by C-G formula based on SCr of 9.87 mg/dL (H)). Liver Function Tests: Recent Labs  Lab 09/04/18 0510 09/05/18 0649 09/06/18 0514 09/07/18 0854 09/08/18 0500  ALBUMIN 2.9* 3.0* 3.5 3.4* 3.1*   No results for input(s): LIPASE, AMYLASE in the last 168 hours. No results for input(s): AMMONIA in the last 168 hours. Coagulation Profile: No results for input(s): INR, PROTIME in the last 168 hours. Cardiac Enzymes: No results for input(s): CKTOTAL, CKMB, CKMBINDEX, TROPONINI in the last 168 hours. BNP (last 3 results) No results for input(s): PROBNP in the last 8760 hours. HbA1C: No results for input(s): HGBA1C in the last 72 hours. CBG: Recent Labs  Lab 09/07/18 1742 09/07/18 2117 09/08/18 0745 09/08/18 0920 09/08/18 1140  GLUCAP 137* 105* 60* 141* 130*   Lipid Profile: No results for input(s): CHOL, HDL, LDLCALC, TRIG, CHOLHDL, LDLDIRECT in the last 72 hours. Thyroid Function Tests: No results for input(s): TSH, T4TOTAL, FREET4, T3FREE, THYROIDAB in the last 72 hours. Anemia Panel: No results for input(s): VITAMINB12, FOLATE, FERRITIN, TIBC, IRON, RETICCTPCT in the last 72 hours. Sepsis Labs: No results for input(s): PROCALCITON, LATICACIDVEN in the last 168 hours.  Recent Results (from the past 240 hour(s))  SARS Coronavirus 2 [facility] order, Performed in [place] hospital lab)     Status: Abnormal    Collection Time: 08/30/18  5:51 AM   Specimen: Nasopharyngeal Swab  Result Value Ref Range Status   SARS Coronavirus 2 POSITIVE (A) NEGATIVE Final    Comment: CRITICAL RESULT CALLED TO, READ BACK BY AND VERIFIED WITH: RN Alphia Moh [phone] AT 967 AM BY CM (NOTE) If result is NEGATIVE SARS-CoV-2 target nucleic acids are NOT DETECTED. The SARS-CoV-2 RNA is generally detectable in upper and lower  respiratory specimens during the acute phase of infection. The lowest  concentration of SARS-CoV-2 viral copies this assay can detect is 250  copies / mL. A negative result does not preclude SARS-CoV-2 infection  and should not be used as the sole basis for treatment or other  patient management decisions.  A negative result may occur with  improper specimen collection / handling, submission of specimen other  than nasopharyngeal swab, presence of viral mutation(s) within the  areas targeted by this assay, and inadequate number of viral copies  (<250 copies / mL). A negative result  must be combined with clinical  observations, patient history, and epidemiological information. If result is POSITIVE SARS-CoV-2 target nucleic acids are DE TECTED. The SARS-CoV-2 RNA is generally detectable in upper and lower  respiratory specimens during the acute phase of infection.  Positive  results are indicative of active infection with SARS-CoV-2.  Clinical  correlation with patient history and other diagnostic information is  necessary to determine patient infection status.  Positive results do  not rule out bacterial infection or co-infection with other viruses. If result is PRESUMPTIVE POSTIVE SARS-CoV-2 nucleic acids MAY BE PRESENT.   A presumptive positive result was obtained on the submitted specimen  and confirmed on repeat testing.  While 2019 novel coronavirus  (SARS-CoV-2) nucleic acids may be present in the submitted sample  additional confirmatory testing may be necessary for epidemiological   and / or clinical management purposes  to differentiate between  SARS-CoV-2 and other Sarbecovirus currently known to infect humans.  If clinically indicated additional testing with an alternate test  methodology (L [id number]) is advised. The SARS-CoV-2 RNA is generally  detectable in upper and lower respiratory specimens during the acute  phase of infection. The expected result is Negative. Fact Sheet for Patients:  StrictlyIdeas.no Fact Sheet for Healthcare Providers: BankingDealers.co.za This test is not yet approved or cleared by the [place] FDA and has been authorized for detection and/or diagnosis of SARS-CoV-2 by FDA under an Emergency Use Authorization (EUA).  This EUA will remain in effect (meaning this test can be used) for the duration of the COVID-19 declaration under Section 564(b)(1) of the Act, 21 U.S.C. section 360bbb-3(b)(1), unless the authorization is terminated or revoked sooner. Performed at [facility], [place] [address] [place], [place] [address]   SARS Coronavirus 2 (CEPHEID - Performed in [facility] hospital lab), Hosp Order     Status: Abnormal   Collection Time: 09/03/18  1:41 PM   Specimen: Nasopharyngeal Swab  Result Value Ref Range Status   SARS Coronavirus 2 POSITIVE (A) NEGATIVE Final    Comment: CRITICAL VALUE NOTED.  VALUE IS CONSISTENT WITH PREVIOUSLY REPORTED AND CALLED VALUE. (NOTE) If result is NEGATIVE SARS-CoV-2 target nucleic acids are NOT DETECTED. The SARS-CoV-2 RNA is generally detectable in upper and lower  respiratory specimens during the acute phase of infection. The lowest  concentration of SARS-CoV-2 viral copies this assay can detect is 250  copies / mL. A negative result does not preclude SARS-CoV-2 infection  and should not be used as the sole basis for treatment or other  patient management decisions.  A negative result may occur with  improper specimen collection /  handling, submission of specimen other  than nasopharyngeal swab, presence of viral mutation(s) within the  areas targeted by this assay, and inadequate number of viral copies  (<250 copies / mL). A negative result must be combined with clinical  observations, patient history, and epidemiological information. If result is POSITIVE SARS-CoV-2 target nucleic acids are DETECTED. Th e SARS-CoV-2 RNA is generally detectable in upper and lower  respiratory specimens during the acute phase of infection.  Positive  results are indicative of active infection with SARS-CoV-2.  Clinical  correlation with patient history and other diagnostic information is  necessary to determine patient infection status.  Positive results do  not rule out bacterial infection or co-infection with other viruses. If result is PRESUMPTIVE POSTIVE SARS-CoV-2 nucleic acids MAY BE PRESENT.   A presumptive positive result was obtained on the submitted specimen  and confirmed  on repeat testing.  While 2019 novel coronavirus  (SARS-CoV-2) nucleic acids may be present in the submitted sample  additional confirmatory testing may be necessary for epidemiological  and / or clinical management purposes  to differentiate between  SARS-CoV-2 and other Sarbecovirus currently known to infect humans.  If clinically indicated additional testing with an alternate test  methodology [phone]) is  advised. The SARS-CoV-2 RNA is generally  detectable in upper and lower respiratory specimens during the acute  phase of infection. The expected result is Negative. Fact Sheet for Patients:  StrictlyIdeas.no Fact Sheet for Healthcare Providers: BankingDealers.co.za This test is not yet approved or cleared by the [place] FDA and has been authorized for detection and/or diagnosis of SARS-CoV-2 by FDA under an Emergency Use Authorization (EUA).  This EUA will remain in effect (meaning this  test can be used) for the duration of the COVID-19 declaration under Section 564(b)(1) of the Act, 21 U.S.C. section 360bbb-3(b)(1), unless the authorization is terminated or revoked sooner. Performed at [facility], [place] [address], [place], [place] [address]          Radiology Studies: No results found.      Scheduled Meds: . amiodarone  400 mg Oral Daily  . aspirin EC  81 mg Oral Daily  . atorvastatin  80 mg Oral q1800  . calcium acetate  2,001 mg Oral TID WC  . Chlorhexidine Gluconate Cloth  6 each Topical Q0600  . feeding supplement  1 Container Oral TID BM  . feeding supplement (PRO-STAT SUGAR FREE 64)  30 mL Oral TID BM  . FLUoxetine  20 mg Oral Daily  . heparin  5,000 Units Subcutaneous Q8H  . insulin aspart  0-5 Units Subcutaneous QHS  . insulin aspart  0-9 Units Subcutaneous TID WC  . [START ON 09/09/2018] insulin glargine  25 Units Subcutaneous Daily  . levothyroxine  50 mcg Oral Q0600  . mouth rinse  15 mL Mouth Rinse BID  . midodrine  10 mg Oral TID WC  . multivitamin  1 tablet Oral QHS  . pantoprazole  40 mg Oral Daily  . polyethylene glycol  17 g Oral Daily  . vitamin C  500 mg Oral Daily  . zinc sulfate  220 mg Oral Daily   Continuous Infusions: . sodium chloride    . sodium chloride    . sodium chloride    . sodium chloride    . sodium chloride    . sodium chloride       LOS: 16 days    Time spent: 30 minutes.     Hosie Poisson, MD [facility] Pager [phone]  If 7PM-7AM, please contact night-coverage [email or web address] Password [email or web address] 09/08/2018, 2:18 PM

## 2018-09-08 NOTE — Progress Notes (Signed)
Initial Nutrition Assessment  DOCUMENTATION CODES:   Obesity unspecified  INTERVENTION:   -Continue renal MVI daily -Continue 500 mg vitamin C daily -Continue 220 mg zinc sulfate daily -D/c Nepro shake, due to poor tolerance -30 ml Prostat TID, each supplement provides 100 kcals and 15 grams protein -Magic cup TID with meals, each supplement provides 290 kcal and 9 grams of protein -Boost Breeze po TID, each supplement provides 250 kcal and 9 grams of protein -Provided "Food Pyramind for Health Eating with Kidney Disease" handout in AVS/discharge summary  NUTRITION DIAGNOSIS:   Increased nutrient needs related to chronic illness(ESRD on HD) as evidenced by estimated needs.  GOAL:   Patient will meet greater than or equal to 90% of their needs  MONITOR:   PO intake, Supplement acceptance, Labs, Weight trends, Skin, I & O's  REASON FOR ASSESSMENT:   Malnutrition Screening Tool, Consult Diet education  ASSESSMENT:   Jamie Arias is a 63 y.o. female with medical history significant of ESRD on HD MWF, anemia, chronic diastolic congestive heart failure, CAD status post PCI, type 2 diabetes, hypertension, hyperlipidemia, hypothyroidism, and conditions listed below presenting as a transfer from [facility].  COVID-19 positive.  Patient transferred to [facility] for management of COVID-19 and hemodialysis.  Patient states her last dialysis was on Monday, June 15 and since then she has not been able to go because she is not feeling well.  Reports having generalized weakness.  Reports having intermittent tremors for the past few months.  Denies family history of tremors.  Denies any fevers, chills, chest pain, shortness of breath, cough, nausea, vomiting, abdominal pain, diarrhea, dysuria, or urinary frequency/urgency.  No additional history could be obtained from her.  Pt admitted with COVID-19 infection and ESRD on HD with missed HD session.   Reviewed I/O's: -4.7 L since  08/25/18  RD unable to enter pt room, secondary to pt testing positive for COVID-19 virus.   RD called pt room x 2, however, no answer. RD unable to obtain further nutrition-related history at this time.   Nephrology consulted for a low K diet; K has improved over the past 48 hours.   Per chart review, pt with flat affect and poor appetite. Family has also been bringing in home cooked meals for pt to eat. Noted meal completion 10-50%. Nepro supplements have been ordered, however, pt is refusing due to poor tolerance.   Reviewed wt hx. Noted that pt has experienced a 9% wt loss over the past 7 months, which is not significant for time frame, however, concerning given multiple co-morbidities, positive COVID-19, and decreased oral intake. Unsure of pt's dry wt.   Medications reviewed and include phoslo, vitamin C, zinc sulfate, and miralax.   Per CSW notes, arranging outpatient HD and SNF placement once medically stable for discharge.   Lab Results  Component Value Date   HGBA1C 7.1 08/16/2017   PTA DM medications are 40 units insulin glargine daily.   Labs reviewed: K WDL, Phos: 5.1, CBGS: 60-137 (inpatient orders for glycemic control are 0-9 units insulin aspart TID with meals, 5 units insulin aspart TID with meals, and 45 units insulin glargine daily).   NUTRITION - FOCUSED PHYSICAL EXAM:    Most Recent Value  Orbital Region  Unable to assess  Upper Arm Region  Unable to assess  Thoracic and Lumbar Region  Unable to assess  Buccal Region  Unable to assess  [place] Region  Unable to assess  Clavicle Bone Region  Unable to  assess  Clavicle and Acromion Bone Region  Unable to assess  Scapular Bone Region  Unable to assess  Dorsal Hand  Unable to assess  Patellar Region  Unable to assess  Anterior Thigh Region  Unable to assess  Posterior Calf Region  Unable to assess  Edema (RD Assessment)  Unable to assess  Hair  Unable to assess  Eyes  Unable to assess  Mouth  Unable to assess   Skin  Unable to assess  Nails  Unable to assess       Diet Order:   Diet Order            Diet renal with fluid restriction Fluid restriction: 1500 mL Fluid; Room service appropriate? Yes; Fluid consistency: Thin  Diet effective now              EDUCATION NEEDS:   No education needs have been identified at this time  Skin:  Skin Assessment: Reviewed RN Assessment  Last BM:  09/07/18  Height:   Ht Readings from Last 1 Encounters:  08/23/18 5\' 4"  (1.626 m)    Weight:   Wt Readings from Last 1 Encounters:  09/08/18 89.2 kg    Ideal Body Weight:  54.5 kg  BMI:  Body mass index is 33.76 kg/m.  Estimated Nutritional Needs:   Kcal:  1900-2100  Protein:  95-110 grams  Fluid:  1 L + UOP    Jamie Arias A. Jimmye Norman, RD, LDN, [place] Registered Dietitian II Certified Diabetes Care and Education Specialist Pager: [phone] After hours Pager: [phone]

## 2018-09-08 NOTE — Progress Notes (Signed)
Renal Navigator received call from CSW/C. Pinion that patient has agreed on SNF placement at [facility] in [place] and will, therefore, need OP HD clinic transfer to [place] in [place]. Renal Navigator contacted patient's home OP HD clinic/[facility] to request transfer of care to [facility]/[place] in [place]. Renal Navigator will follow closely to ensure that there is a COVID shift seat available for patient at [facility].  Alphonzo Cruise, [place] Renal Navigator [phone]

## 2018-09-08 NOTE — Progress Notes (Signed)
[facility] Progress Note   Assessment/ Plan:   # ESRD MWF:  -Patient had dialysis on Friday and 7/4  because of hyperkalemia, persistently elevated but yesterday's levels had some hemolysis.  S/P lokelma 2 doses yesterday. May have poor clearance.  HD today on schedule, K much improved to 4.0 this AM without hemolysis, will do 2K bath today and then possible Lokelma on nondialysis days if needed. - Per prior chart, the outpatient dialysis has been arranged at [facility], MWF third shift if is still COVID positive when she is medically stable to discharge.  # Anemia of chronic kidney disease: hgb 15, stop aranesp  # Secondary hyperparathyroidism: Monitor phosphorus level. Continue PhosLo.   # Hypotension/volume: On midodrine for hypotension. Monitor BP.  #COVID-19 positive: Per primary team, on  steroid  #Altered mental status: Minimizing sedatives.  Primary team is following and seen by psychiatrist for  depression.  Subjective:    For HD today.  Chart reviewed.  K much better this AM.     Objective:   BP (!) 104/44 (BP Location: Left Leg)   Pulse 64   Temp 98.7 F (37.1 C) (Axillary)   Resp 15   Ht 5\' 4"  (1.626 m)   Wt 89.2 kg   SpO2 93%   BMI 33.76 kg/m   Physical Exam: Exam deferred to COVID + status, relying on exam of other providers and extensive chart review.  Labs: BMET Recent Labs  Lab 09/02/18 0444 09/03/18 0849 09/04/18 0510 09/05/18 0649 09/06/18 0514 09/07/18 0854 09/07/18 1626 09/08/18 0500  NA 136 135 137 138 137 137  --  142  K 5.0 3.8 5.3* 5.3* 5.5* 5.9* 6.3* 4.0  CL 95* 94* 96* 95* 95* 98  --  95*  CO2 26 24 27 25 27  19*  --  27  GLUCOSE 122* 95 100* 91 151* 75  --  54*  BUN 53* 44* 51* 79* 62* 53*  --  74*  CREATININE 7.20* 5.40* 7.00* 9.11* 8.04* 7.27*  --  9.87*  CALCIUM 8.9 8.7* 9.2 9.4 9.5 9.3  --  9.3  PHOS 5.6* 3.3 5.1* 5.7* 4.9* 3.2  --  5.1*   CBC Recent Labs  Lab 09/03/18 0832 09/04/18 0510  09/06/18 1145  WBC 13.3* 12.7* 8.7  HGB 11.7* 11.0* 15.2*  HCT 35.3* 33.1* 44.4  MCV 97.0 97.1 97.2  PLT 285 249 157    @IMGRELPRIORS @ Medications:    . amiodarone  400 mg Oral Daily  . aspirin EC  81 mg Oral Daily  . atorvastatin  80 mg Oral q1800  . calcium acetate  2,001 mg Oral TID WC  . Chlorhexidine Gluconate Cloth  6 each Topical Q0600  . darbepoetin (ARANESP) injection - DIALYSIS  60 mcg Intravenous Q Wed-HD  . feeding supplement (NEPRO CARB STEADY)  237 mL Oral BID BM  . FLUoxetine  20 mg Oral Daily  . heparin  5,000 Units Subcutaneous Q8H  . insulin aspart  0-5 Units Subcutaneous QHS  . insulin aspart  0-9 Units Subcutaneous TID WC  . insulin aspart  5 Units Subcutaneous TID WC  . insulin glargine  45 Units Subcutaneous Daily  . levothyroxine  50 mcg Oral Q0600  . mouth rinse  15 mL Mouth Rinse BID  . midodrine  10 mg Oral TID WC  . multivitamin  1 tablet Oral QHS  . pantoprazole  40 mg Oral Daily  . polyethylene glycol  17 g Oral Daily  . sodium  zirconium cyclosilicate  10 g Oral BID  . vitamin C  500 mg Oral Daily  . zinc sulfate  220 mg Oral Daily     Madelon Lips MD [facility] pgr [phone] 09/08/2018, 10:47 AM

## 2018-09-09 LAB — GLUCOSE, CAPILLARY
Glucose-Capillary: 103 mg/dL — ABNORMAL HIGH (ref 70–99)
Glucose-Capillary: 141 mg/dL — ABNORMAL HIGH (ref 70–99)
Glucose-Capillary: 51 mg/dL — ABNORMAL LOW (ref 70–99)
Glucose-Capillary: 52 mg/dL — ABNORMAL LOW (ref 70–99)
Glucose-Capillary: 62 mg/dL — ABNORMAL LOW (ref 70–99)
Glucose-Capillary: 92 mg/dL (ref 70–99)

## 2018-09-09 LAB — LACTATE DEHYDROGENASE: LDH: 222 U/L — ABNORMAL HIGH (ref 98–192)

## 2018-09-09 LAB — RENAL FUNCTION PANEL
Albumin: 3.3 g/dL — ABNORMAL LOW (ref 3.5–5.0)
Anion gap: 19 — ABNORMAL HIGH (ref 5–15)
BUN: 61 mg/dL — ABNORMAL HIGH (ref 8–23)
CO2: 26 mmol/L (ref 22–32)
Calcium: 9.2 mg/dL (ref 8.9–10.3)
Chloride: 95 mmol/L — ABNORMAL LOW (ref 98–111)
Creatinine, Ser: 8.61 mg/dL — ABNORMAL HIGH (ref 0.44–1.00)
GFR calc Af Amer: 5 mL/min — ABNORMAL LOW (ref >60)
GFR calc non Af Amer: 4 mL/min — ABNORMAL LOW (ref >60)
Glucose, Bld: 45 mg/dL — ABNORMAL LOW (ref 70–99)
Phosphorus: 4.3 mg/dL (ref 2.5–4.6)
Potassium: 3.6 mmol/L (ref 3.5–5.1)
Sodium: 140 mmol/L (ref 135–145)

## 2018-09-09 MED ORDER — DEXTROSE 50 % IV SOLN
INTRAVENOUS | Status: AC
  Administered 2018-09-09: 50 mL
  Filled 2018-09-09: qty 50

## 2018-09-09 MED ORDER — DEXTROSE 50 % IV SOLN
INTRAVENOUS | Status: AC
  Administered 2018-09-10: 50 mL
  Filled 2018-09-09: qty 50

## 2018-09-09 MED ORDER — RENA-VITE PO TABS: 1.0000 | ORAL_TABLET | Freq: Every day | ORAL | 0 refills | Status: AC

## 2018-09-09 MED ORDER — ZINC SULFATE 220 (50 ZN) MG PO CAPS: 220.0000 mg | ORAL_CAPSULE | Freq: Every day | ORAL | Status: DC

## 2018-09-09 MED ORDER — MIDODRINE HCL 10 MG PO TABS: 10.0000 mg | ORAL_TABLET | Freq: Three times a day (TID) | ORAL | Status: DC

## 2018-09-09 MED ORDER — LEVOTHYROXINE SODIUM 50 MCG PO TABS: 50.0000 ug | ORAL_TABLET | Freq: Every day | ORAL | Status: DC

## 2018-09-09 MED ORDER — INSULIN GLARGINE 100 UNIT/ML ~~LOC~~ SOLN: 25.0000 [IU] | Freq: Every day | SUBCUTANEOUS | 11 refills | Status: DC

## 2018-09-09 MED ORDER — AMIODARONE HCL 400 MG PO TABS: 400.0000 mg | ORAL_TABLET | Freq: Every day | ORAL | Status: DC

## 2018-09-09 MED ORDER — FLUOXETINE HCL 20 MG PO CAPS: 20.0000 mg | ORAL_CAPSULE | Freq: Every day | ORAL | 3 refills | Status: DC

## 2018-09-09 MED ORDER — PRO-STAT SUGAR FREE PO LIQD: 30.0000 mL | Freq: Three times a day (TID) | ORAL | 0 refills | Status: DC

## 2018-09-09 MED ORDER — ASCORBIC ACID 500 MG PO TABS: 500.0000 mg | ORAL_TABLET | Freq: Every day | ORAL | Status: AC

## 2018-09-09 MED ORDER — POLYETHYLENE GLYCOL 3350 17 G PO PACK: 17.0000 g | PACK | Freq: Every day | ORAL | 0 refills | Status: DC

## 2018-09-09 NOTE — Progress Notes (Signed)
Renal Navigator received message from OP HD clinic/[facility] requesting updated records on patient. Records sent. Renal Navigator requests update regarding seat for patient as soon as possible.  Alphonzo Cruise, [place] Renal Navigator [phone]

## 2018-09-09 NOTE — Telephone Encounter (Signed)
lmov to schedule appt  °

## 2018-09-09 NOTE — Progress Notes (Signed)
Renal Navigator received notification from OP HD clinic/[facility] that they require documentation that patient is able to dialyze sitting in chair for entire treatment. Renal Navigator discussed with Nephrologist/Dr. Hollie Salk.  Renal Navigator will continue to follow closely.  Jamie Arias, [place] Renal Navigator [phone]

## 2018-09-09 NOTE — Progress Notes (Signed)
[facility] Progress Note   Assessment/ Plan:   # ESRD MWF:  -Patient had dialysis on Friday and 7/4  because of hyperkalemia. S/P lokelma 2 doses yesterday. HD on schedule 7/6, K much improved. - Has been accepted to SNF in [place] and therefore will need different HD unit in [place] shift at [facility]. - will need to dialyze in chair tomorrow.  # Anemia of chronic kidney disease: hgb 15, stop aranesp  # Secondary hyperparathyroidism: Monitor phosphorus level. Continue PhosLo.   # Hypotension/volume: On midodrine for hypotension. Monitor BP.  #COVID-19 positive: Per primary team, s/p steroids  #Altered mental status: Minimizing sedatives.  Primary team is following and seen by psychiatrist for  Depression.  # Afib: new dx, on amiodarone per cardiology.  No AC for now.    Subjective:    S/p HD yesterday with no UF it looks like per the notes.  Confirming with dialysis staff.  PT notes indicate that pt has sig fatigue, requiring rolling walker.  Has been accepted to SNF in [place]   Objective:   BP (!) 125/53 (BP Location: Left Leg)   Pulse 70   Temp 98.2 F (36.8 C) (Oral)   Resp 16   Ht 5\' 4"  (1.626 m)   Wt 89.2 kg   SpO2 98%   BMI 33.76 kg/m   Physical Exam: Exam deferred to COVID + status, relying on exam of other providers and extensive chart review.  Labs: BMET Recent Labs  Lab 09/03/18 0849 09/04/18 0510 09/05/18 0649 09/06/18 0514 09/07/18 0854 09/07/18 1626 09/08/18 0500 09/09/18 0629  NA 135 137 138 137 137  --  142 140  K 3.8 5.3* 5.3* 5.5* 5.9* 6.3* 4.0 3.6  CL 94* 96* 95* 95* 98  --  95* 95*  CO2 24 27 25 27  19*  --  27 26  GLUCOSE 95 100* 91 151* 75  --  54* 45*  BUN 44* 51* 79* 62* 53*  --  74* 61*  CREATININE 5.40* 7.00* 9.11* 8.04* 7.27*  --  9.87* 8.61*  CALCIUM 8.7* 9.2 9.4 9.5 9.3  --  9.3 9.2  PHOS 3.3 5.1* 5.7* 4.9* 3.2  --  5.1* 4.3   CBC Recent Labs  Lab 09/03/18 0832 09/04/18 0510  09/06/18 1145 09/08/18 1357  WBC 13.3* 12.7* 8.7 13.3*  HGB 11.7* 11.0* 15.2* 12.0  HCT 35.3* 33.1* 44.4 36.3  MCV 97.0 97.1 97.2 100.8*  PLT 285 249 157 161    @IMGRELPRIORS @ Medications:    . amiodarone  400 mg Oral Daily  . aspirin EC  81 mg Oral Daily  . atorvastatin  80 mg Oral q1800  . calcium acetate  2,001 mg Oral TID WC  . Chlorhexidine Gluconate Cloth  6 each Topical Q0600  . feeding supplement  1 Container Oral TID BM  . feeding supplement (PRO-STAT SUGAR FREE 64)  30 mL Oral TID BM  . FLUoxetine  20 mg Oral Daily  . heparin  5,000 Units Subcutaneous Q8H  . insulin aspart  0-5 Units Subcutaneous QHS  . insulin aspart  0-9 Units Subcutaneous TID WC  . insulin glargine  25 Units Subcutaneous Daily  . levothyroxine  50 mcg Oral Q0600  . mouth rinse  15 mL Mouth Rinse BID  . midodrine  10 mg Oral TID WC  . multivitamin  1 tablet Oral QHS  . pantoprazole  40 mg Oral Daily  . polyethylene glycol  17 g Oral Daily  .  vitamin C  500 mg Oral Daily  . zinc sulfate  220 mg Oral Daily     Madelon Lips MD [facility] pgr [phone] 09/09/2018, 11:03 AM

## 2018-09-09 NOTE — Discharge Summary (Signed)
Physician Discharge Summary  Jamie Arias QPR:[id number] DOB: 1955/04/18 DOA: 08/23/2018  PCP: [name], MD  Admit date: 08/23/2018 Discharge date: 09/10/2018  Admitted From: Home.  Disposition:  SNF  Recommendations for Outpatient Follow-up:  1. Follow up with PCP in 1-2 weeks 2. Please obtain BMP/CBC in one week 3. Please follow up with cardiology as recommended.     Discharge Condition:Guarded.  CODE STATUS: full code.  Diet recommendation: Heart Healthy    Brief/Interim Summary: 63 y.o.femalewith medical history significant ofESRD on HDMWF,anemia, chronic diastolic congestive heart failure, CAD status post PCI, type 2 diabetes, hypertension, hyperlipidemia, hypothyroidism, and conditions listed belowpresenting as a transfer from [facility]. COVID-19positive. Patient transferred to [facility] for management of COVID-19 and hemodialysis.  No new complaints,  Plan to d/c patient to SNF at [facility] which takes patients who are covid 19 positive and nephrology CSW planning for CLIP closer to the SNF.  Discussed the plan with the patient husband and the patient.    Discharge Diagnoses:  Principal Problem:   COVID-19 virus infection Active Problems:   HTN (hypertension)   ESRD (end stage renal disease) ([place])   Acute on chronic respiratory failure with hypoxia (HCC)   UTI (urinary tract infection)   Adjustment disorder with depressed mood  Hyperkalemia secondary to end-stage renal disease. Resolved.  Patient is on dialysis Monday Wednesday Friday.HD today.  Further management as per nephrology.    Newly diagnosed atrial fibrillation Rate controlled and appreciate cardiology recommendations. Episodes of bradycardia , but patient asymptomatic.  Decreased the dose of  amiodarone to 400 mg daily.  Defer to cardiology for starting anticoagulation. As per cardiology no anti coagulation given elevated risk for bleeding from COVID 19 Complications.      Depression Patient was started on Prozac 20 mg daily and trazodone. Further management as per psychiatry as outpatient.   Persistent generalized weakness/deconditioning PT OT recommended CIR Patient unable to be admitted to CIR due to positive COVID-19. Plan for SNF who will take COVID 19 positive patients.    Hypothyroidism Follow-up with TSH in 4 to 6 weeks.   Intractable nausea and vomiting.  Resolved   Acute respiratory failure secondary to COVID-19 related pneumonia Weaned off oxygen. She is on RA with good sats.  Slowly taper steroids.   Type 2 diabetes with hyperglycemia well controll CBG (last 3)  Recent Labs    09/09/18 0738 09/09/18 0823 09/09/18 1158  GLUCAP 51* 103* 92     Decrease the lantus to 15 units daily as she has poor oral intake and in view of the hypoglycemia  and stopped the pre meal coverage.  Continue with SSI.  Discussed with the patient and RN.      Discharge Instructions   Allergies as of 09/09/2018      Reactions   Nsaids Other (See Comments)   Contraindicated due to kidney disease.   Doxycycline Other (See Comments)   tremor      Medication List    STOP taking these medications   Basaglar KwikPen 100 UNIT/ML Sopn Replaced by: insulin glargine 100 UNIT/ML injection   gabapentin 300 MG capsule Commonly known as: NEURONTIN   Insulin Pen Needle 31G X 5 MM Misc Commonly known as: Global Ease Inject Pen Needles     TAKE these medications   Accu-Chek Aviva Plus w/Device Kit Use as directed to test blood sugar up to three times daily   Accu-Chek FastClix Lancets Misc Check blood glucose twice daily, diagnosis code E11.9  accu-chek soft touch lancets Use as instructed   Accu-Chek Softclix Lancet Dev Kit Use as directed to test blood sugar up to three times daily   acetaminophen 325 MG tablet Commonly known as: TYLENOL Take 650 mg by mouth daily as needed for moderate pain or headache.    Alirocumab 75 MG/ML Soaj Commonly known as: Praluent Inject 75 mg into the skin every 14 (fourteen) days.   amiodarone 400 MG tablet Commonly known as: PACERONE Take 1 tablet (400 mg total) by mouth daily. Start taking on: September 10, 2018   ascorbic acid 500 MG tablet Commonly known as: VITAMIN C Take 1 tablet (500 mg total) by mouth daily. Start taking on: September 10, 2018   aspirin EC 81 MG tablet Take 81 mg by mouth daily.   atorvastatin 80 MG tablet Commonly known as: LIPITOR Take 1 tablet (80 mg total) by mouth daily at 6 PM.   blood glucose meter kit and supplies Kit Dispense based on patient and insurance preference. Use up to twice daily. (FOR ICD-10 E11.9)   Calcium Acetate 668 (169 Ca) MG Tabs Take 2,004 mg by mouth 3 (three) times daily with meals.   ergocalciferol 1.25 MG (50000 UT) capsule Commonly known as: VITAMIN D2 Take 50,000 Units by mouth once a week.   feeding supplement (PRO-STAT SUGAR FREE 64) Liqd Take 30 mLs by mouth 3 (three) times daily between meals.   ferrous sulfate 325 (65 FE) MG tablet Take 325 mg by mouth daily with breakfast.   FLUoxetine 20 MG capsule Commonly known as: PROZAC Take 1 capsule (20 mg total) by mouth daily. Start taking on: September 10, 2018   fluticasone 50 MCG/ACT nasal spray Commonly known as: FLONASE Place 2 sprays into both nostrils daily.   furosemide 20 MG tablet Commonly known as: LASIX Take 40 mg by mouth every other day. Non-dialysis days   glucose blood test strip Commonly known as: Accu-Chek Aviva Use as instructed to test blood sugar up to 3 times daily   insulin glargine 100 UNIT/ML injection Commonly known as: LANTUS Inject 0.25 mLs (25 Units total) into the skin daily. Start taking on: September 10, 2018 Replaces: Basaglar KwikPen 100 UNIT/ML Sopn   levothyroxine 50 MCG tablet Commonly known as: SYNTHROID Take 1 tablet (50 mcg total) by mouth daily at 6 (six) AM. Start taking on: September 10, 2018 What  changed:   medication strength  how much to take  how to take this  when to take this  additional instructions   lidocaine-prilocaine cream Commonly known as: EMLA Apply 1 application topically as needed (port access).   midodrine 10 MG tablet Commonly known as: PROAMATINE Take 1 tablet (10 mg total) by mouth 3 (three) times daily with meals. What changed:   how much to take  how to take this  when to take this  additional instructions  Another medication with the same name was removed. Continue taking this medication, and follow the directions you see here.   multivitamin Tabs tablet Take 1 tablet by mouth at bedtime.   nitroGLYCERIN 0.4 MG SL tablet Commonly known as: NITROSTAT DISSOLVE ONE TABLET UNDER THE TONGUE EVERY 5 MINUTES AS NEEDED FOR CHEST PAIN.  DO NOT EXCEED A TOTAL OF 3 DOSES IN 15 MINUTES What changed: See the new instructions.   omeprazole 20 MG capsule Commonly known as: PRILOSEC Take 20 mg by mouth 2 (two) times daily before a meal.   ondansetron 4 MG disintegrating tablet Commonly known as:  ZOFRAN-ODT Take 4 mg by mouth every 8 (eight) hours as needed for nausea or vomiting.   polyethylene glycol 17 g packet Commonly known as: MIRALAX / GLYCOLAX Take 17 g by mouth daily. Start taking on: September 10, 2018   traZODone 50 MG tablet Commonly known as: DESYREL TAKE 1 TABLET BY MOUTH AT BEDTIME   zinc sulfate 220 (50 Zn) MG capsule Take 1 capsule (220 mg total) by mouth daily. Start taking on: September 10, 2018            Durable Medical Equipment  (From admission, onward)         Start     Ordered   08/30/18 0551  For home use only DME Walker rolling  Once    Question:  Patient needs a walker to treat with the following condition  Answer:  Ambulatory dysfunction   08/30/18 0550   08/28/18 1303  For home use only DME Bedside commode  Once    Comments: 3 n 1  Question:  Patient needs a bedside commode to treat with the following  condition  Answer:  Ambulatory dysfunction   08/28/18 1303         Follow-up Information    [name], MD. Schedule an appointment as soon as possible for a visit in 1 week(s).   Specialty: Family Medicine Contact information: [address] [place] [address] [phone]        Wellington Hampshire, MD .   Specialty: Cardiology Contact information: [address] [place] [address] [phone]          Allergies  Allergen Reactions  . Nsaids Other (See Comments)    Contraindicated due to kidney disease.  Marland Kitchen Doxycycline Other (See Comments)    tremor    Consultations:  [place]  Psychiatry.    Procedures/Studies: Ct Head Wo Contrast  Result Date: 08/23/2018 CLINICAL DATA:  Dizziness and weakness with tremors. Dialysis patient. EXAM: CT HEAD WITHOUT CONTRAST TECHNIQUE: Contiguous axial images were obtained from the base of the skull through the vertex without intravenous contrast. COMPARISON:  09/04/2017 FINDINGS: Brain: Ventricles, cisterns and other CSF spaces are within normal. There is no mass, mass effect, shift of midline structures or acute hemorrhage. There is mild chronic ischemic microvascular disease present. Vascular: No hyperdense vessel or unexpected calcification. Skull: Normal. Negative for fracture or focal lesion. Sinuses/Orbits: Orbits are normal. Minimal mucosal membrane thickening over the paranasal sinuses. Mastoid air cells are clear. Other: None. IMPRESSION: No acute findings. Mild chronic ischemic microvascular disease. Minimal chronic sinus inflammatory change. Electronically Signed   By: Marin Olp M.D.   On: 08/23/2018 12:18   Ct Chest Wo Contrast  Result Date: 08/24/2018 CLINICAL DATA:  COVID-19 positive, dyspnea EXAM: CT CHEST WITHOUT CONTRAST TECHNIQUE: Multidetector CT imaging of the chest was performed following the standard protocol without IV contrast. COMPARISON:  Chest radiograph from  one day prior. 10/23/2015 chest CT. FINDINGS: Cardiovascular: Top-normal heart size. No significant pericardial effusion/thickening. Three-vessel coronary atherosclerosis status post CABG. Atherosclerotic nonaneurysmal thoracic aorta. Dilated main pulmonary artery (3.7 cm diameter). Partially visualized right axillary and proximal right upper extremity venous stents. Mediastinum/Nodes: No discrete thyroid nodules. Unremarkable esophagus. No pathologically enlarged axillary, mediastinal or hilar lymph nodes, noting limited sensitivity for the detection of hilar adenopathy on this noncontrast study. Lungs/Pleura: No pneumothorax. No pleural effusion. Chronic rounded atelectasis in dependent basilar left lower lobe adjacent to mild left pleural thickening. Patchy ground-glass opacities throughout both lungs  involving all lung lobes, most prominent in the left upper lobe. No central airway stenoses. No discrete lung masses or significant pulmonary nodules. Upper abdomen: Cholecystectomy. Musculoskeletal: No aggressive appearing focal osseous lesions. Intact sternotomy wires. Moderate thoracic spondylosis. IMPRESSION: 1. Patchy ground-glass opacities throughout both lungs involving all lung lobes, most prominent in the left upper lobe, compatible with known COVID-19 pneumonia. There are a spectrum of findings in the lungs which can be seen with acute atypical infection (as well as other non-infectious etiologies). In particular, viral pneumonia (including COVID-19) should be considered in the appropriate clinical setting. 2. Chronic rounded atelectasis in the dependent left lung base with chronic smooth left pleural thickening. 3. Dilated main pulmonary artery, suggesting pulmonary arterial hypertension. Aortic Atherosclerosis (ICD10-I70.0). Electronically Signed   By: Ilona Sorrel M.D.   On: 08/24/2018 08:33   Dg Chest Portable 1 View  Result Date: 08/23/2018 CLINICAL DATA:  Dizziness and weakness.  Dialysis. EXAM:  PORTABLE CHEST 1 VIEW COMPARISON:  09/04/2017 FINDINGS: Lungs are adequately inflated without lobar consolidation or effusion. Mild stable cardiomegaly. Remainder of the exam is unchanged. IMPRESSION: No acute cardiopulmonary disease. Mild stable cardiomegaly. Electronically Signed   By: Marin Olp M.D.   On: 08/23/2018 10:25       Subjective:  Appears depressed and poor oral intake,  Reports being fine, does not want to eat, and wants to go home.    Discharge Exam: Vitals:   09/09/18 0112 09/09/18 0746  BP: (!) 116/39 (!) 125/53  Pulse: 62 70  Resp:  16  Temp: 97.8 F (36.6 C) 98.2 F (36.8 C)  SpO2: 98% 98%   Vitals:   09/08/18 1039 09/08/18 2008 09/09/18 0112 09/09/18 0746  BP: (!) 104/44 (!) 130/45 (!) 116/39 (!) 125/53  Pulse: 64 (!) 59 62 70  Resp: 15   16  Temp: 98.7 F (37.1 C) 97.9 F (36.6 C) 97.8 F (36.6 C) 98.2 F (36.8 C)  TempSrc: Axillary Oral Oral Oral  SpO2: 93% 97% 98% 98%  Weight: 89.2 kg     Height:        General: Pt is alert, awake, not in acute distress Cardiovascular: RRR, S1/S2 +, no rubs, no gallops Respiratory: CTA bilaterally, no wheezing, no rhonchi Abdominal: Soft, NT, ND, bowel sounds + Extremities: no edema, no cyanosis    The results of significant diagnostics from this hospitalization (including imaging, microbiology, ancillary and laboratory) are listed below for reference.     Microbiology: Recent Results (from the past 240 hour(s))  SARS Coronavirus 2 (CEPHEID - Performed in [place] hospital lab), Hosp Order     Status: Abnormal   Collection Time: 09/03/18  1:41 PM   Specimen: Nasopharyngeal Swab  Result Value Ref Range Status   SARS Coronavirus 2 POSITIVE (A) NEGATIVE Final    Comment: CRITICAL VALUE NOTED.  VALUE IS CONSISTENT WITH PREVIOUSLY REPORTED AND CALLED VALUE. (NOTE) If result is NEGATIVE SARS-CoV-2 target nucleic acids are NOT DETECTED. The SARS-CoV-2 RNA is generally detectable in upper and lower   respiratory specimens during the acute phase of infection. The lowest  concentration of SARS-CoV-2 viral copies this assay can detect is 250  copies / mL. A negative result does not preclude SARS-CoV-2 infection  and should not be used as the sole basis for treatment or other  patient management decisions.  A negative result may occur with  improper specimen collection / handling, submission of specimen other  than nasopharyngeal swab, presence of viral mutation(s) within the  areas targeted by this assay, and inadequate number of viral copies  (<250 copies / mL). A negative result must be combined with clinical  observations, patient history, and epidemiological information. If result is POSITIVE SARS-CoV-2 target nucleic acids are DETECTED. Th e SARS-CoV-2 RNA is generally detectable in upper and lower  respiratory specimens during the acute phase of infection.  Positive  results are indicative of active infection with SARS-CoV-2.  Clinical  correlation with patient history and other diagnostic information is  necessary to determine patient infection status.  Positive results do  not rule out bacterial infection or co-infection with other viruses. If result is PRESUMPTIVE POSTIVE SARS-CoV-2 nucleic acids MAY BE PRESENT.   A presumptive positive result was obtained on the submitted specimen  and confirmed on repeat testing.  While 2019 novel coronavirus  (SARS-CoV-2) nucleic acids may be present in the submitted sample  additional confirmatory testing may be necessary for epidemiological  and / or clinical management purposes  to differentiate between  SARS-CoV-2 and other Sarbecovirus currently known to infect humans.  If clinically indicated additional testing with an alternate test  methodology [phone]) is  advised. The SARS-CoV-2 RNA is generally  detectable in upper and lower respiratory specimens during the acute  phase of infection. The expected result is Negative. Fact  Sheet for Patients:  StrictlyIdeas.no Fact Sheet for Healthcare Providers: BankingDealers.co.za This test is not yet approved or cleared by the [place] FDA and has been authorized for detection and/or diagnosis of SARS-CoV-2 by FDA under an Emergency Use Authorization (EUA).  This EUA will remain in effect (meaning this test can be used) for the duration of the COVID-19 declaration under Section 564(b)(1) of the Act, 21 U.S.C. section 360bbb-3(b)(1), unless the authorization is terminated or revoked sooner. Performed at [facility], [place] [address], [place], [place] [address]   SARS Coronavirus 2 (CEPHEID- Performed in [place] hospital lab), Hosp Order     Status: Abnormal   Collection Time: 09/08/18  4:34 PM   Specimen: Nasopharyngeal Swab  Result Value Ref Range Status   SARS Coronavirus 2 POSITIVE (A) NEGATIVE Final    Comment: RESULT CALLED TO, READ BACK BY AND VERIFIED WITH: Guerry Bruin RN 09/08/18 1738 JDW (NOTE) If result is NEGATIVE SARS-CoV-2 target nucleic acids are NOT DETECTED. The SARS-CoV-2 RNA is generally detectable in upper and lower  respiratory specimens during the acute phase of infection. The lowest  concentration of SARS-CoV-2 viral copies this assay can detect is 250  copies / mL. A negative result does not preclude SARS-CoV-2 infection  and should not be used as the sole basis for treatment or other  patient management decisions.  A negative result may occur with  improper specimen collection / handling, submission of specimen other  than nasopharyngeal swab, presence of viral mutation(s) within the  areas targeted by this assay, and inadequate number of viral copies  (<250 copies / mL). A negative result must be combined with clinical  observations, patient history, and epidemiological information. If result is POSITIVE SARS-CoV-2 target nucleic acids are DETECTED. The S ARS-CoV-2 RNA is  generally detectable in upper and lower  respiratory specimens during the acute phase of infection.  Positive  results are indicative of active infection with SARS-CoV-2.  Clinical  correlation with patient history and other diagnostic information is  necessary to determine patient infection status.  Positive results do  not rule out bacterial infection or co-infection with other viruses. If result is PRESUMPTIVE POSTIVE SARS-CoV-2  nucleic acids MAY BE PRESENT.   A presumptive positive result was obtained on the submitted specimen  and confirmed on repeat testing.  While 2019 novel coronavirus  (SARS-CoV-2) nucleic acids may be present in the submitted sample  additional confirmatory testing may be necessary for epidemiological  and / or clinical management purposes  to differentiate between  SARS-CoV-2 and other Sarbecovirus currently known to infect humans.  If clinically indicated additional testing with an alternate test  methodology [phone]) is adv ised. The SARS-CoV-2 RNA is generally  detectable in upper and lower respiratory specimens during the acute  phase of infection. The expected result is Negative. Fact Sheet for Patients:  StrictlyIdeas.no Fact Sheet for Healthcare Providers: BankingDealers.co.za This test is not yet approved or cleared by the [place] FDA and has been authorized for detection and/or diagnosis of SARS-CoV-2 by FDA under an Emergency Use Authorization (EUA).  This EUA will remain in effect (meaning this test can be used) for the duration of the COVID-19 declaration under Section 564(b)(1) of the Act, 21 U.S.C. section 360bbb-3(b)(1), unless the authorization is terminated or revoked sooner. Performed at [facility], [place] [address] [place], [place] [address]      Labs: BNP (last 3 results) No results for input(s): BNP in the last 8760 hours. Basic Metabolic Panel: Recent Labs  Lab  09/05/18 0649 09/06/18 0514 09/07/18 0854 09/07/18 1626 09/08/18 0500 09/09/18 0629  NA 138 137 137  --  142 140  K 5.3* 5.5* 5.9* 6.3* 4.0 3.6  CL 95* 95* 98  --  95* 95*  CO2 25 27 19*  --  27 26  GLUCOSE 91 151* 75  --  54* 45*  BUN 79* 62* 53*  --  74* 61*  CREATININE 9.11* 8.04* 7.27*  --  9.87* 8.61*  CALCIUM 9.4 9.5 9.3  --  9.3 9.2  PHOS 5.7* 4.9* 3.2  --  5.1* 4.3   Liver Function Tests: Recent Labs  Lab 09/05/18 0649 09/06/18 0514 09/07/18 0854 09/08/18 0500 09/09/18 0629  ALBUMIN 3.0* 3.5 3.4* 3.1* 3.3*   No results for input(s): LIPASE, AMYLASE in the last 168 hours. No results for input(s): AMMONIA in the last 168 hours. CBC: Recent Labs  Lab 09/03/18 0832 09/04/18 0510 09/06/18 1145 09/08/18 1357  WBC 13.3* 12.7* 8.7 13.3*  HGB 11.7* 11.0* 15.2* 12.0  HCT 35.3* 33.1* 44.4 36.3  MCV 97.0 97.1 97.2 100.8*  PLT 285 249 157 161   Cardiac Enzymes: No results for input(s): CKTOTAL, CKMB, CKMBINDEX, TROPONINI in the last 168 hours. BNP: Invalid input(s): POCBNP CBG: Recent Labs  Lab 09/08/18 1621 09/08/18 2054 09/09/18 0738 09/09/18 0823 09/09/18 1158  GLUCAP 131* 138* 51* 103* 92   D-Dimer No results for input(s): DDIMER in the last 72 hours. Hgb A1c No results for input(s): HGBA1C in the last 72 hours. Lipid Profile No results for input(s): CHOL, HDL, LDLCALC, TRIG, CHOLHDL, LDLDIRECT in the last 72 hours. Thyroid function studies No results for input(s): TSH, T4TOTAL, T3FREE, THYROIDAB in the last 72 hours.  Invalid input(s): FREET3 Anemia work up No results for input(s): VITAMINB12, FOLATE, FERRITIN, TIBC, IRON, RETICCTPCT in the last 72 hours. Urinalysis    Component Value Date/Time   COLORURINE YELLOW (A) 08/23/2018 1209   APPEARANCEUR CLOUDY (A) 08/23/2018 1209   APPEARANCEUR Hazy 07/23/2013 0741   LABSPEC 1.016 08/23/2018 1209   LABSPEC 1.016 07/23/2013 0741   PHURINE 5.0 08/23/2018 1209   [place] 08/23/2018 1209  GLUCOSEU Negative 07/23/2013 0741   HGBUR SMALL (A) 08/23/2018 1209   BILIRUBINUR NEGATIVE 08/23/2018 1209   BILIRUBINUR Negative 07/23/2013 0741   KETONESUR NEGATIVE 08/23/2018 1209   PROTEINUR 100 (A) 08/23/2018 1209   UROBILINOGEN 0.2 06/30/2013 2213   NITRITE NEGATIVE 08/23/2018 1209   LEUKOCYTESUR SMALL (A) 08/23/2018 1209   LEUKOCYTESUR Negative 07/23/2013 0741   Sepsis Labs Invalid input(s): PROCALCITONIN,  WBC,  LACTICIDVEN Microbiology Recent Results (from the past 240 hour(s))  SARS Coronavirus 2 (CEPHEID - Performed in [place] hospital lab), Hosp Order     Status: Abnormal   Collection Time: 09/03/18  1:41 PM   Specimen: Nasopharyngeal Swab  Result Value Ref Range Status   SARS Coronavirus 2 POSITIVE (A) NEGATIVE Final    Comment: CRITICAL VALUE NOTED.  VALUE IS CONSISTENT WITH PREVIOUSLY REPORTED AND CALLED VALUE. (NOTE) If result is NEGATIVE SARS-CoV-2 target nucleic acids are NOT DETECTED. The SARS-CoV-2 RNA is generally detectable in upper and lower  respiratory specimens during the acute phase of infection. The lowest  concentration of SARS-CoV-2 viral copies this assay can detect is 250  copies / mL. A negative result does not preclude SARS-CoV-2 infection  and should not be used as the sole basis for treatment or other  patient management decisions.  A negative result may occur with  improper specimen collection / handling, submission of specimen other  than nasopharyngeal swab, presence of viral mutation(s) within the  areas targeted by this assay, and inadequate number of viral copies  (<250 copies / mL). A negative result must be combined with clinical  observations, patient history, and epidemiological information. If result is POSITIVE SARS-CoV-2 target nucleic acids are DETECTED. Th e SARS-CoV-2 RNA is generally detectable in upper and lower  respiratory specimens during the acute phase of infection.  Positive  results are indicative of active  infection with SARS-CoV-2.  Clinical  correlation with patient history and other diagnostic information is  necessary to determine patient infection status.  Positive results do  not rule out bacterial infection or co-infection with other viruses. If result is PRESUMPTIVE POSTIVE SARS-CoV-2 nucleic acids MAY BE PRESENT.   A presumptive positive result was obtained on the submitted specimen  and confirmed on repeat testing.  While 2019 novel coronavirus  (SARS-CoV-2) nucleic acids may be present in the submitted sample  additional confirmatory testing may be necessary for epidemiological  and / or clinical management purposes  to differentiate between  SARS-CoV-2 and other Sarbecovirus currently known to infect humans.  If clinically indicated additional testing with an alternate test  methodology [phone]) is  advised. The SARS-CoV-2 RNA is generally  detectable in upper and lower respiratory specimens during the acute  phase of infection. The expected result is Negative. Fact Sheet for Patients:  StrictlyIdeas.no Fact Sheet for Healthcare Providers: BankingDealers.co.za This test is not yet approved or cleared by the [place] FDA and has been authorized for detection and/or diagnosis of SARS-CoV-2 by FDA under an Emergency Use Authorization (EUA).  This EUA will remain in effect (meaning this test can be used) for the duration of the COVID-19 declaration under Section 564(b)(1) of the Act, 21 U.S.C. section 360bbb-3(b)(1), unless the authorization is terminated or revoked sooner. Performed at [facility], [place] [address] [place], [place] [address]   SARS Coronavirus 2 (CEPHEID- Performed in [facility] hospital lab), Hosp Order     Status: Abnormal   Collection Time: 09/08/18  4:34 PM   Specimen: Nasopharyngeal Swab  Result  Value Ref Range Status   SARS Coronavirus 2 POSITIVE (A) NEGATIVE Final    Comment: RESULT CALLED  TO, READ BACK BY AND VERIFIED WITH: Guerry Bruin RN 09/08/18 1738 JDW (NOTE) If result is NEGATIVE SARS-CoV-2 target nucleic acids are NOT DETECTED. The SARS-CoV-2 RNA is generally detectable in upper and lower  respiratory specimens during the acute phase of infection. The lowest  concentration of SARS-CoV-2 viral copies this assay can detect is 250  copies / mL. A negative result does not preclude SARS-CoV-2 infection  and should not be used as the sole basis for treatment or other  patient management decisions.  A negative result may occur with  improper specimen collection / handling, submission of specimen other  than nasopharyngeal swab, presence of viral mutation(s) within the  areas targeted by this assay, and inadequate number of viral copies  (<250 copies / mL). A negative result must be combined with clinical  observations, patient history, and epidemiological information. If result is POSITIVE SARS-CoV-2 target nucleic acids are DETECTED. The S ARS-CoV-2 RNA is generally detectable in upper and lower  respiratory specimens during the acute phase of infection.  Positive  results are indicative of active infection with SARS-CoV-2.  Clinical  correlation with patient history and other diagnostic information is  necessary to determine patient infection status.  Positive results do  not rule out bacterial infection or co-infection with other viruses. If result is PRESUMPTIVE POSTIVE SARS-CoV-2 nucleic acids MAY BE PRESENT.   A presumptive positive result was obtained on the submitted specimen  and confirmed on repeat testing.  While 2019 novel coronavirus  (SARS-CoV-2) nucleic acids may be present in the submitted sample  additional confirmatory testing may be necessary for epidemiological  and / or clinical management purposes  to differentiate between  SARS-CoV-2 and other Sarbecovirus currently known to infect humans.  If clinically indicated additional testing with an  alternate test  methodology [phone]) is adv ised. The SARS-CoV-2 RNA is generally  detectable in upper and lower respiratory specimens during the acute  phase of infection. The expected result is Negative. Fact Sheet for Patients:  StrictlyIdeas.no Fact Sheet for Healthcare Providers: BankingDealers.co.za This test is not yet approved or cleared by the [place] FDA and has been authorized for detection and/or diagnosis of SARS-CoV-2 by FDA under an Emergency Use Authorization (EUA).  This EUA will remain in effect (meaning this test can be used) for the duration of the COVID-19 declaration under Section 564(b)(1) of the Act, 21 U.S.C. section 360bbb-3(b)(1), unless the authorization is terminated or revoked sooner. Performed at [facility], [place] [address] [place], [place] [address]      Time coordinating discharge: 34 minutes  SIGNED:   Hosie Poisson, MD  [facility] 09/09/2018, 1:09 PM Pager   If 7PM-7AM, please contact night-coverage [email or web address] Password [email or web address]

## 2018-09-09 NOTE — Plan of Care (Signed)
  Problem: Education: Goal: Knowledge of General Education information will improve Description: Including pain rating scale, medication(s)/side effects and non-pharmacologic comfort measures Outcome: Not Progressing   Problem: Activity: Goal: Risk for activity intolerance will decrease Outcome: Not Progressing   Problem: Nutrition: Goal: Adequate nutrition will be maintained Outcome: Not Progressing   Problem: Pain Managment: Goal: General experience of comfort will improve Outcome: Adequate for Discharge

## 2018-09-10 LAB — GLUCOSE, CAPILLARY
Glucose-Capillary: 142 mg/dL — ABNORMAL HIGH (ref 70–99)
Glucose-Capillary: 62 mg/dL — ABNORMAL LOW (ref 70–99)
Glucose-Capillary: 97 mg/dL (ref 70–99)
Glucose-Capillary: 108 mg/dL — ABNORMAL HIGH (ref 70–99)
Glucose-Capillary: 103 mg/dL — ABNORMAL HIGH (ref 70–99)
Glucose-Capillary: 51 mg/dL — ABNORMAL LOW (ref 70–99)
Glucose-Capillary: 110 mg/dL — ABNORMAL HIGH (ref 70–99)

## 2018-09-10 LAB — RENAL FUNCTION PANEL
Albumin: 2.9 g/dL — ABNORMAL LOW (ref 3.5–5.0)
Anion gap: 15 (ref 5–15)
BUN: 76 mg/dL — ABNORMAL HIGH (ref 8–23)
CO2: 27 mmol/L (ref 22–32)
Calcium: 9.6 mg/dL (ref 8.9–10.3)
Chloride: 93 mmol/L — ABNORMAL LOW (ref 98–111)
Creatinine, Ser: 10.36 mg/dL — ABNORMAL HIGH (ref 0.44–1.00)
GFR calc Af Amer: 4 mL/min — ABNORMAL LOW (ref >60)
GFR calc non Af Amer: 4 mL/min — ABNORMAL LOW (ref >60)
Glucose, Bld: 103 mg/dL — ABNORMAL HIGH (ref 70–99)
Phosphorus: 3.1 mg/dL (ref 2.5–4.6)
Potassium: 4.1 mmol/L (ref 3.5–5.1)
Sodium: 135 mmol/L (ref 135–145)

## 2018-09-10 LAB — CBC
HCT: 34.4 % — ABNORMAL LOW (ref 36.0–46.0)
Hemoglobin: 11.4 g/dL — ABNORMAL LOW (ref 12.0–15.0)
MCH: 33.4 pg (ref 26.0–34.0)
MCHC: 33.1 g/dL (ref 30.0–36.0)
MCV: 100.9 fL — ABNORMAL HIGH (ref 80.0–100.0)
Platelets: 125 10*3/uL — ABNORMAL LOW (ref 150–400)
RBC: 3.41 MIL/uL — ABNORMAL LOW (ref 3.87–5.11)
RDW: 18.6 % — ABNORMAL HIGH (ref 11.5–15.5)
WBC: 8 10*3/uL (ref 4.0–10.5)
nRBC: 0.2 % (ref 0.0–0.2)

## 2018-09-10 LAB — LACTATE DEHYDROGENASE: LDH: 197 U/L — ABNORMAL HIGH (ref 98–192)

## 2018-09-10 MED ORDER — LIDOCAINE HCL (PF) 1 % IJ SOLN: 5.0000 mL | INTRAMUSCULAR | Status: DC | PRN

## 2018-09-10 MED ORDER — SODIUM CHLORIDE 0.9 % IV SOLN: 100.0000 mL | INTRAVENOUS | Status: DC | PRN

## 2018-09-10 MED ORDER — INSULIN GLARGINE 100 UNIT/ML ~~LOC~~ SOLN
20.0000 [IU] | Freq: Every day | SUBCUTANEOUS | Status: DC
  Filled 2018-09-10: qty 0.2

## 2018-09-10 MED ORDER — HEPARIN SODIUM (PORCINE) 1000 UNIT/ML DIALYSIS
20.0000 [IU]/kg | Freq: Once | INTRAMUSCULAR | Status: DC
  Filled 2018-09-10 (×2): qty 2

## 2018-09-10 MED ORDER — HEPARIN SODIUM (PORCINE) 1000 UNIT/ML DIALYSIS: 1000.0000 [IU] | INTRAMUSCULAR | Status: DC | PRN

## 2018-09-10 MED ORDER — DEXTROSE 50 % IV SOLN
INTRAVENOUS | Status: AC
  Filled 2018-09-10: qty 50

## 2018-09-10 MED ORDER — ALTEPLASE 2 MG IJ SOLR: 2.0000 mg | Freq: Once | INTRAMUSCULAR | Status: DC | PRN

## 2018-09-10 MED ORDER — LIDOCAINE-PRILOCAINE 2.5-2.5 % EX CREA
1.0000 "application " | TOPICAL_CREAM | CUTANEOUS | Status: DC | PRN
  Administered 2018-09-13: 1 via TOPICAL
  Filled 2018-09-10: qty 5

## 2018-09-10 MED ORDER — PENTAFLUOROPROP-TETRAFLUOROETH EX AERO: 1.0000 "application " | INHALATION_SPRAY | CUTANEOUS | Status: DC | PRN

## 2018-09-10 MED ORDER — AMIODARONE HCL 200 MG PO TABS
200.0000 mg | ORAL_TABLET | Freq: Two times a day (BID) | ORAL | Status: DC
  Administered 2018-09-11 – 2018-09-15 (×9): 200 mg via ORAL
  Filled 2018-09-10 (×9): qty 1

## 2018-09-10 NOTE — Progress Notes (Signed)
Inpatient Diabetes Program Recommendations  AACE/ADA: New Consensus Statement on Inpatient Glycemic Control (2015)  Target Ranges:  Prepandial:   less than 140 mg/dL      Peak postprandial:   less than 180 mg/dL (1-2 hours)      Critically ill patients:  140 - 180 mg/dL   Lab Results  Component Value Date   GLUCAP 97 09/10/2018   HGBA1C 7.1 08/16/2017    Review of Glycemic Control Results for Jamie Arias, Jamie Arias (MRN [id number]) as of 09/10/2018 11:38  Ref. Range 09/09/2018 21:03 09/09/2018 23:33 09/10/2018 02:05 09/10/2018 07:40 09/10/2018 08:11  Glucose-Capillary Latest Ref Range: 70 - 99 mg/dL 52 (L) 62 (L) 142 (H) 62 (L) 97   Diabetes history: DM 2 Outpatient Diabetes medications:  Basaglar 40 units daily Current orders for Inpatient glycemic control:  Lantus 25 units daily, Novolog sensitive tid with meals and HS  Inpatient Diabetes Program Recommendations:    Please consider reducing Lantus to 20 units daily.   Thanks  Adah Perl, RN, BC-ADM Inpatient Diabetes Coordinator Pager [phone] (8a-5p)

## 2018-09-10 NOTE — Progress Notes (Signed)
PROGRESS NOTE  Jamie Arias LTJ:[id number] DOB: 12/16/1955   PCP: [name], MD  Patient is from: home  DOA: 08/23/2018 LOS: 30  Brief Narrative / Interim history: 63 y.o.femalewith medical history significant ofESRD on HDMWF,anemia, chronic diastolic congestive heart failure, CAD status post PCI, type 2 diabetes, hypertension, hyperlipidemia, hypothyroidism, and conditions listed belowpresenting as a transfer from [facility]. COVID-19positive. Patient transferred to [facility] for management of COVID-19 and hemodialysis.  Patient discharged to SNF in [facility] and CLIP'd SNF closer to her outpatient dialysis center but stayed for one more HD session  Subjective: Patient became hypotensive with cold extremities while on HD this morning.  Also some abdominal discomfort suggestive for vasovagal episode.  BP improved after giving back about 200 cc.  She denies chest pain, dyspnea or palpitation.  No focal neuro symptoms.  Objective: Vitals:   09/10/18 1030 09/10/18 1100 09/10/18 1120 09/10/18 1528  BP: (!) 104/40 (!) 96/38 (!) 129/52 (!) 103/28  Pulse: 71 85 73 78  Resp:   18 16  Temp:    97.9 F (36.6 C)  TempSrc:   Axillary Oral  SpO2:   100%   Weight:      Height:        Intake/Output Summary (Last 24 hours) at 09/10/2018 1601 Last data filed at 09/10/2018 1120 Gross per 24 hour  Intake 440 ml  Output 87 ml  Net 353 ml   Filed Weights   09/07/18 0615 09/08/18 0720 09/08/18 1039  Weight: 90.7 kg 89.2 kg 89.2 kg    Examination:  GENERAL: Lying flat on chair while on HD. HEENT: MMM.  Vision and hearing grossly intact.  NECK: Supple.  No apparent JVD.  LUNGS:  No IWOB. Good air movement bilaterally. HEART:  RRR. Heart sounds normal.  ABD: Bowel sounds present. Soft. Non tender.  MSK/EXT: Very cold extremities with delayed cap refills SKIN: no apparent skin lesion or wound NEURO: Awake, alert and oriented appropriately.  No gross deficit.  PSYCH:  Calm.    I have personally reviewed the following labs and images:  Radiology Studies: No results found.  Microbiology: Recent Results (from the past 240 hour(s))  SARS Coronavirus 2 (CEPHEID - Performed in [place] hospital lab), Hosp Order     Status: Abnormal   Collection Time: 09/03/18  1:41 PM   Specimen: Nasopharyngeal Swab  Result Value Ref Range Status   SARS Coronavirus 2 POSITIVE (A) NEGATIVE Final    Comment: CRITICAL VALUE NOTED.  VALUE IS CONSISTENT WITH PREVIOUSLY REPORTED AND CALLED VALUE. (NOTE) If result is NEGATIVE SARS-CoV-2 target nucleic acids are NOT DETECTED. The SARS-CoV-2 RNA is generally detectable in upper and lower  respiratory specimens during the acute phase of infection. The lowest  concentration of SARS-CoV-2 viral copies this assay can detect is 250  copies / mL. A negative result does not preclude SARS-CoV-2 infection  and should not be used as the sole basis for treatment or other  patient management decisions.  A negative result may occur with  improper specimen collection / handling, submission of specimen other  than nasopharyngeal swab, presence of viral mutation(s) within the  areas targeted by this assay, and inadequate number of viral copies  (<250 copies / mL). A negative result must be combined with clinical  observations, patient history, and epidemiological information. If result is POSITIVE SARS-CoV-2 target nucleic acids are DETECTED. Th e SARS-CoV-2 RNA is generally detectable in upper and lower  respiratory specimens during the acute phase of  infection.  Positive  results are indicative of active infection with SARS-CoV-2.  Clinical  correlation with patient history and other diagnostic information is  necessary to determine patient infection status.  Positive results do  not rule out bacterial infection or co-infection with other viruses. If result is PRESUMPTIVE POSTIVE SARS-CoV-2 nucleic acids MAY BE PRESENT.   A  presumptive positive result was obtained on the submitted specimen  and confirmed on repeat testing.  While 2019 novel coronavirus  (SARS-CoV-2) nucleic acids may be present in the submitted sample  additional confirmatory testing may be necessary for epidemiological  and / or clinical management purposes  to differentiate between  SARS-CoV-2 and other Sarbecovirus currently known to infect humans.  If clinically indicated additional testing with an alternate test  methodology [phone]) is  advised. The SARS-CoV-2 RNA is generally  detectable in upper and lower respiratory specimens during the acute  phase of infection. The expected result is Negative. Fact Sheet for Patients:  StrictlyIdeas.no Fact Sheet for Healthcare Providers: BankingDealers.co.za This test is not yet approved or cleared by the [place] FDA and has been authorized for detection and/or diagnosis of SARS-CoV-2 by FDA under an Emergency Use Authorization (EUA).  This EUA will remain in effect (meaning this test can be used) for the duration of the COVID-19 declaration under Section 564(b)(1) of the Act, 21 U.S.C. section 360bbb-3(b)(1), unless the authorization is terminated or revoked sooner. Performed at [facility], [place] [address], [place], [place] [address]   SARS Coronavirus 2 (CEPHEID- Performed in [place] hospital lab), Hosp Order     Status: Abnormal   Collection Time: 09/08/18  4:34 PM   Specimen: Nasopharyngeal Swab  Result Value Ref Range Status   SARS Coronavirus 2 POSITIVE (A) NEGATIVE Final    Comment: RESULT CALLED TO, READ BACK BY AND VERIFIED WITH: Guerry Bruin RN 09/08/18 1738 JDW (NOTE) If result is NEGATIVE SARS-CoV-2 target nucleic acids are NOT DETECTED. The SARS-CoV-2 RNA is generally detectable in upper and lower  respiratory specimens during the acute phase of infection. The lowest  concentration of SARS-CoV-2 viral copies this  assay can detect is 250  copies / mL. A negative result does not preclude SARS-CoV-2 infection  and should not be used as the sole basis for treatment or other  patient management decisions.  A negative result may occur with  improper specimen collection / handling, submission of specimen other  than nasopharyngeal swab, presence of viral mutation(s) within the  areas targeted by this assay, and inadequate number of viral copies  (<250 copies / mL). A negative result must be combined with clinical  observations, patient history, and epidemiological information. If result is POSITIVE SARS-CoV-2 target nucleic acids are DETECTED. The S ARS-CoV-2 RNA is generally detectable in upper and lower  respiratory specimens during the acute phase of infection.  Positive  results are indicative of active infection with SARS-CoV-2.  Clinical  correlation with patient history and other diagnostic information is  necessary to determine patient infection status.  Positive results do  not rule out bacterial infection or co-infection with other viruses. If result is PRESUMPTIVE POSTIVE SARS-CoV-2 nucleic acids MAY BE PRESENT.   A presumptive positive result was obtained on the submitted specimen  and confirmed on repeat testing.  While 2019 novel coronavirus  (SARS-CoV-2) nucleic acids may be present in the submitted sample  additional confirmatory testing may be necessary for epidemiological  and / or clinical management purposes  to differentiate between  SARS-CoV-2 and other Sarbecovirus currently known to infect humans.  If clinically indicated additional testing with an alternate test  methodology [phone]) is adv ised. The SARS-CoV-2 RNA is generally  detectable in upper and lower respiratory specimens during the acute  phase of infection. The expected result is Negative. Fact Sheet for Patients:  StrictlyIdeas.no Fact Sheet for Healthcare Providers:  BankingDealers.co.za This test is not yet approved or cleared by the [place] FDA and has been authorized for detection and/or diagnosis of SARS-CoV-2 by FDA under an Emergency Use Authorization (EUA).  This EUA will remain in effect (meaning this test can be used) for the duration of the COVID-19 declaration under Section 564(b)(1) of the Act, 21 U.S.C. section 360bbb-3(b)(1), unless the authorization is terminated or revoked sooner. Performed at [facility], [place] [address] [place], [place] [address]     Sepsis Labs: Invalid input(s): PROCALCITONIN, LACTICIDVEN  Urine analysis:    Component Value Date/Time   COLORURINE YELLOW (A) 08/23/2018 1209   APPEARANCEUR CLOUDY (A) 08/23/2018 1209   APPEARANCEUR Hazy 07/23/2013 0741   LABSPEC 1.016 08/23/2018 1209   LABSPEC 1.016 07/23/2013 0741   PHURINE 5.0 08/23/2018 1209   GLUCOSEU NEGATIVE 08/23/2018 1209   GLUCOSEU Negative 07/23/2013 0741   HGBUR SMALL (A) 08/23/2018 1209   BILIRUBINUR NEGATIVE 08/23/2018 1209   BILIRUBINUR Negative 07/23/2013 0741   KETONESUR NEGATIVE 08/23/2018 1209   PROTEINUR 100 (A) 08/23/2018 1209   UROBILINOGEN 0.2 06/30/2013 2213   NITRITE NEGATIVE 08/23/2018 1209   LEUKOCYTESUR SMALL (A) 08/23/2018 1209   LEUKOCYTESUR Negative 07/23/2013 0741    Anemia Panel: No results for input(s): VITAMINB12, FOLATE, FERRITIN, TIBC, IRON, RETICCTPCT in the last 72 hours.  Thyroid Function Tests: No results for input(s): TSH, T4TOTAL, FREET4, T3FREE, THYROIDAB in the last 72 hours.  Lipid Profile: No results for input(s): CHOL, HDL, LDLCALC, TRIG, CHOLHDL, LDLDIRECT in the last 72 hours.  CBG: Recent Labs  Lab 09/10/18 0205 09/10/18 0740 09/10/18 0811 09/10/18 1143 09/10/18 1525  GLUCAP 142* 62* 97 108* 103*    HbA1C: No results for input(s): HGBA1C in the last 72 hours.  BNP (last 3 results): No results for input(s): PROBNP in the last 8760 hours.  Cardiac  Enzymes: No results for input(s): CKTOTAL, CKMB, CKMBINDEX, TROPONINI in the last 168 hours.  Coagulation Profile: No results for input(s): INR, PROTIME in the last 168 hours.  Liver Function Tests: Recent Labs  Lab 09/06/18 0514 09/07/18 0854 09/08/18 0500 09/09/18 0629 09/10/18 0822  ALBUMIN 3.5 3.4* 3.1* 3.3* 2.9*   No results for input(s): LIPASE, AMYLASE in the last 168 hours. No results for input(s): AMMONIA in the last 168 hours.  Basic Metabolic Panel: Recent Labs  Lab 09/06/18 0514 09/07/18 0854 09/07/18 1626 09/08/18 0500 09/09/18 0629 09/10/18 0822  NA 137 137  --  142 140 135  K 5.5* 5.9* 6.3* 4.0 3.6 4.1  CL 95* 98  --  95* 95* 93*  CO2 27 19*  --  27 26 27   GLUCOSE 151* 75  --  54* 45* 103*  BUN 62* 53*  --  74* 61* 76*  CREATININE 8.04* 7.27*  --  9.87* 8.61* 10.36*  CALCIUM 9.5 9.3  --  9.3 9.2 9.6  PHOS 4.9* 3.2  --  5.1* 4.3 3.1   GFR: Estimated Creatinine Clearance: 5.9 mL/min (A) (by C-G formula based on SCr of 10.36 mg/dL (H)).  CBC: Recent Labs  Lab 09/04/18 0510 09/06/18 1145 09/08/18 1357 09/10/18 9326  WBC 12.7* 8.7 13.3* 8.0  HGB 11.0* 15.2* 12.0 11.4*  HCT 33.1* 44.4 36.3 34.4*  MCV 97.1 97.2 100.8* 100.9*  PLT 249 157 161 125*    Procedures:  None  Assessment & Plan: ESRD/BMD:  -HD per nephrology -CLIPP'd to outpatient HD center closer to SNF in [place]-MWF  Acute respiratory failure due to COVID-19 related pneumonia -On room air -Slowly taper steroids  New A. Fib: Rhythm controlled with amiodarone. No anticoagulation given elevated risk of bleeding from COVID-19 per cardiology.  Appreciate cardiology's help. -Per card note on 08/29/2018: amiodarone 400 mg BID x7d, then 200 mg BID x2wks, then 200 mg/day   CAD status post CABG: Stable -Continue home cardiac meds, statin and aspirin  Diastolic CHF: Stable. -Fluid management with HD -Lasix on nondialysis days.  Hypotension/vasovagal: Became hypotensive with BP in  60s/20s while on HD/ultrafiltration today. -BP improved after giving 200 cc back -Continue Midodrine  Generalized weakness/deconditioning -Ongoing PT/OT at SNF  Hypothyroidism -Continue home Synthroid  DM-2: -Continue Lantus and sliding scale insulin  Depression -Continue Prozac and trazodone  DVT prophylaxis: Heparin Code Status: Full code Family Communication: Pending Disposition Plan: Discharge to SNF pending clarification about HD.  Consultants: Nephrology, cardiology  Antimicrobials: Anti-infectives (From admission, onward)   Start     Dose/Rate Route Frequency Ordered Stop   08/25/18 1000  cefTRIAXone (ROCEPHIN) 1 g in sodium chloride 0.9 % 100 mL IVPB     1 g 200 mL/hr over 30 Minutes Intravenous Every 24 hours 08/24/18 0022 08/27/18 1534   08/24/18 0500  azithromycin (ZITHROMAX) 500 mg in sodium chloride 0.9 % 250 mL IVPB  Status:  Discontinued     500 mg 250 mL/hr over 60 Minutes Intravenous Every 24 hours 08/24/18 0453 08/24/18 0454      Sch Meds:  Scheduled Meds: . amiodarone  400 mg Oral Daily  . aspirin EC  81 mg Oral Daily  . atorvastatin  80 mg Oral q1800  . calcium acetate  2,001 mg Oral TID WC  . Chlorhexidine Gluconate Cloth  6 each Topical Q0600  . feeding supplement  1 Container Oral TID BM  . feeding supplement (PRO-STAT SUGAR FREE 64)  30 mL Oral TID BM  . FLUoxetine  20 mg Oral Daily  . heparin  20 Units/kg Dialysis Once in dialysis  . heparin  5,000 Units Subcutaneous Q8H  . insulin aspart  0-5 Units Subcutaneous QHS  . insulin aspart  0-9 Units Subcutaneous TID WC  . [START ON 09/11/2018] insulin glargine  20 Units Subcutaneous Daily  . levothyroxine  50 mcg Oral Q0600  . mouth rinse  15 mL Mouth Rinse BID  . midodrine  10 mg Oral TID WC  . multivitamin  1 tablet Oral QHS  . pantoprazole  40 mg Oral Daily  . polyethylene glycol  17 g Oral Daily  . vitamin C  500 mg Oral Daily  . zinc sulfate  220 mg Oral Daily   Continuous Infusions: .  sodium chloride    . sodium chloride     PRN Meds:.sodium chloride, sodium chloride, acetaminophen, alteplase, heparin, lidocaine (PF), lidocaine-prilocaine, ondansetron (ZOFRAN) IV, oxyCODONE, pentafluoroprop-tetrafluoroeth, traZODone   Adina Puzzo T. [place]  If 7PM-7AM, please contact night-coverage [email or web address] Password [email or web address] 09/10/2018, 4:01 PM

## 2018-09-10 NOTE — Progress Notes (Signed)
Patient has been accepted at OP HD clinic/[facility] isolation COVID shift, TTS 1:40pm. She needs to arrive at the side door of the clinic at 1:20pm for her treatments. Her first day of treatment will be Saturday, 09/13/18.  Renal Navigator notified CSW/T. Danne Baxter and Nephrologist/Dr. Hollie Salk. Patient cleared for discharged from OP HD standpoint.  Alphonzo Cruise, [place] Renal Navigator [phone]

## 2018-09-10 NOTE — Progress Notes (Signed)
Occupational Therapy Treatment Patient Details Name: Jamie Arias MRN: [id number] DOB: Dec 20, 1955 Today's Date: 09/10/2018    History of present illness Jamie Arias is a 63 y.o. female with medical history significant of ESRD on HD MWF, anemia, chronic diastolic congestive heart failure, CAD status post PCI, type 2 diabetes, hypertension, hyperlipidemia, hypothyroidism, and conditions listed below presenting as a transfer from [facility].  COVID-19 positive.  Patient transferred to [facility] for management of COVID-19 and hemodialysis.    OT comments  Pt with slow progress towards OT goals. Spoke with RN who reports pt OOB to chair today during HD, so suspect this is partly cause of pt's fatigue/lethargy this afternoon. Pt noted to have incontinent BM upon arrival to room, required +2 maxA for bed mobility and totalA for peri-care at bed level during session. Pt was on RA today and O2 sats maintaining >92%. Also encouraged PO intake as RN and NT reports having to provide max encouragement and external assist for self-feeding tasks at this time. Have updated discharge recommendations to SNF given pt slow progression. Will continue to follow acutely.   Follow Up Recommendations  SNF;Supervision/Assistance - 24 hour    Equipment Recommendations  3 in 1 bedside commode;Wheelchair (measurements OT);Wheelchair cushion (measurements OT)(TBD in next venue)          Precautions / Restrictions Precautions Precautions: Fall Restrictions Weight Bearing Restrictions: No       Mobility Bed Mobility Overal bed mobility: Needs Assistance Bed Mobility: Rolling Rolling: Max assist;+2 for physical assistance;+2 for safety/equipment         General bed mobility comments: mulitmodal cues for use of LE and UE to self-assist, minimally participative   Transfers                 General transfer comment: deferred today as pt had already been OOB with nursing staff earlier for HD, per RN  pt required +3 assist to return to bed due to significant fatigue    Balance                                           ADL either performed or assessed with clinical judgement   ADL Overall ADL's : Needs assistance/impaired   Eating/Feeding Details (indicate cue type and reason): encouraged PO intake as RN reports minimal intake                          Toileting- Clothing Manipulation and Hygiene: Total assistance;+2 for physical assistance;+2 for safety/equipment;Bed level Toileting - Clothing Manipulation Details (indicate cue type and reason): pt with incontinent BM upon arrival to room; required +2 assist to clean and change bed linens at bed level       General ADL Comments: pt declined further participation in ADL after pericare with toileting; very fatigued today as pt had been OOB for HD in chair                       Cognition Arousal/Alertness: Lethargic Behavior During Therapy: Flat affect Overall Cognitive Status: Impaired/Different from baseline Area of Impairment: Attention;Following commands;Awareness;Problem solving;Orientation                 Orientation Level: (unable to answer any orientation questions) Current Attention Level: Focused Memory: Decreased short-term memory Following Commands: Follows one step commands inconsistently;Follows one step  commands with increased time   Awareness: Intellectual Problem Solving: Slow processing;Decreased initiation;Requires verbal cues;Requires tactile cues General Comments: Pt with eyes closed most of session. minimal responses to questions today         Exercises     Shoulder Instructions       General Comments pt on RA today with O2 sats >92% throughout    Pertinent Vitals/ Pain       Pain Assessment: Faces Faces Pain Scale: Hurts even more Pain Location: generalized, with mobility Pain Descriptors / Indicators: Discomfort;Grimacing Pain Intervention(s):  Monitored during session;Repositioned;Limited activity within patient's tolerance  Home Living                                          Prior Functioning/Environment              Frequency  Min 2X/week        Progress Toward Goals  OT Goals(current goals can now be found in the care plan section)  Progress towards OT goals: OT to reassess next treatment  Acute Rehab OT Goals Patient Stated Goal: none stated today OT Goal Formulation: With patient Time For Goal Achievement: 09/24/18 Potential to Achieve Goals: Good ADL Goals Pt Will Perform Grooming: with supervision;sitting Pt Will Perform Upper Body Bathing: with supervision;sitting Pt Will Perform Upper Body Dressing: with set-up;with supervision;sitting Pt Will Transfer to Toilet: with min assist;stand pivot transfer Pt Will Perform Toileting - Clothing Manipulation and hygiene: with min assist;sit to/from stand Pt/caregiver will Perform Home Exercise Program: Increased strength;Both right and left upper extremity;With Supervision;With written HEP provided  Plan Discharge plan needs to be updated    Co-evaluation                 AM-PAC OT "6 Clicks" Daily Activity     Outcome Measure   Help from another person eating meals?: A Lot Help from another person taking care of personal grooming?: A Lot Help from another person toileting, which includes using toliet, bedpan, or urinal?: Total Help from another person bathing (including washing, rinsing, drying)?: A Lot Help from another person to put on and taking off regular upper body clothing?: A Lot Help from another person to put on and taking off regular lower body clothing?: Total 6 Click Score: 10    End of Session    OT Visit Diagnosis: Muscle weakness (generalized) (M62.81);Other abnormalities of gait and mobility (R26.89)   Activity Tolerance Patient limited by fatigue;Patient limited by lethargy   Patient Left in bed;with call  bell/phone within reach;with bed alarm set;with nursing/sitter in room(NT present)   Nurse Communication Mobility status        Time: 6568-1275 OT Time Calculation (min): 26 min  Charges: OT General Charges $OT Visit: 1 Visit OT Treatments $Self Care/Home Management : 23-37 mins  Lou Cal, OT [facility] Rehabilitation Services Pager [phone] Office [phone]    Raymondo Band 09/10/2018, 4:12 PM

## 2018-09-10 NOTE — Progress Notes (Signed)
[facility] Progress Note   Assessment/ Plan:   # ESRD MWF:  -Patient had dialysis on Friday and 7/4  because of hyperkalemia. S/P lokelma 2 doses. HD on schedule 7/6, K much improved. - Has been accepted to SNF in [place] and therefore will need different HD unit in [place] shift at [place]. - will need to dialyze in chair today, staff informed.    # Anemia of chronic kidney disease: hgb 15, stop aranesp  # Secondary hyperparathyroidism: Monitor phosphorus level. Continue PhosLo.   # Hypotension/volume: On midodrine for hypotension. Monitor BP.  #COVID-19 positive: Per primary team, s/p steroids  #Altered mental status: Minimizing sedatives.  Primary team is following and seen by psychiatrist for  Depression.  # Afib: new dx, on amiodarone per cardiology.  No AC for now.    # Dispo: from renal perspective can d/c after dialysis.  Subjective:    For HD today in chair.     Objective:   BP (!) 104/40   Pulse 71   Temp 97.7 F (36.5 C) (Axillary)   Resp 20   Ht 5\' 4"  (1.626 m)   Wt 89.2 kg   SpO2 (!) 85%   BMI 33.76 kg/m   Physical Exam: Exam deferred to COVID + status, relying on exam of other providers and extensive chart review.  Labs: BMET Recent Labs  Lab 09/04/18 0510 09/05/18 8099 09/06/18 0514 09/07/18 0854 09/07/18 1626 09/08/18 0500 09/09/18 0629 09/10/18 0822  NA 137 138 137 137  --  142 140 135  K 5.3* 5.3* 5.5* 5.9* 6.3* 4.0 3.6 4.1  CL 96* 95* 95* 98  --  95* 95* 93*  CO2 27 25 27  19*  --  27 26 27   GLUCOSE 100* 91 151* 75  --  54* 45* 103*  BUN 51* 79* 62* 53*  --  74* 61* 76*  CREATININE 7.00* 9.11* 8.04* 7.27*  --  9.87* 8.61* 10.36*  CALCIUM 9.2 9.4 9.5 9.3  --  9.3 9.2 9.6  PHOS 5.1* 5.7* 4.9* 3.2  --  5.1* 4.3 3.1   CBC Recent Labs  Lab 09/04/18 0510 09/06/18 1145 09/08/18 1357 09/10/18 0822  WBC 12.7* 8.7 13.3* 8.0  HGB 11.0* 15.2* 12.0 11.4*  HCT 33.1* 44.4 36.3 34.4*  MCV 97.1 97.2  100.8* 100.9*  PLT 249 157 161 125*    @IMGRELPRIORS @ Medications:    . amiodarone  400 mg Oral Daily  . aspirin EC  81 mg Oral Daily  . atorvastatin  80 mg Oral q1800  . calcium acetate  2,001 mg Oral TID WC  . Chlorhexidine Gluconate Cloth  6 each Topical Q0600  . feeding supplement  1 Container Oral TID BM  . feeding supplement (PRO-STAT SUGAR FREE 64)  30 mL Oral TID BM  . FLUoxetine  20 mg Oral Daily  . heparin  20 Units/kg Dialysis Once in dialysis  . heparin  5,000 Units Subcutaneous Q8H  . insulin aspart  0-5 Units Subcutaneous QHS  . insulin aspart  0-9 Units Subcutaneous TID WC  . insulin glargine  25 Units Subcutaneous Daily  . levothyroxine  50 mcg Oral Q0600  . mouth rinse  15 mL Mouth Rinse BID  . midodrine  10 mg Oral TID WC  . multivitamin  1 tablet Oral QHS  . pantoprazole  40 mg Oral Daily  . polyethylene glycol  17 g Oral Daily  . vitamin C  500 mg Oral Daily  .  zinc sulfate  220 mg Oral Daily     Madelon Lips MD [facility] pgr [phone] 09/10/2018, 11:10 AM

## 2018-09-10 NOTE — Progress Notes (Signed)
Treatment complete goal not met due to low BP resolving to 129/52 pulse 73. Patient treatment done while in chair tolerated well. Received midodrine 10 mg once.

## 2018-09-10 NOTE — Progress Notes (Signed)
Patient O2 stats dropped to 70"s placed O2 at 2l 100% on 2l. Will continue to monitor. Arthor Captain LPN

## 2018-09-10 NOTE — Progress Notes (Signed)
Hypoglycemic Event  CBG:51   Symptoms: none  Follow-up CBG: Time:2245 CBG Result:110  Possible Reasons for Event: poor po intake patient was reluctant to drink her breeze drink protein was added to it. She did drink it after much effort.  Comments/MD notified:not at this time    Jamie Arias

## 2018-09-11 LAB — CORTISOL-AM, BLOOD: Cortisol - AM: 18.9 ug/dL (ref 6.7–22.6)

## 2018-09-11 LAB — RENAL FUNCTION PANEL
Albumin: 2.9 g/dL — ABNORMAL LOW (ref 3.5–5.0)
Anion gap: 15 (ref 5–15)
BUN: 36 mg/dL — ABNORMAL HIGH (ref 8–23)
CO2: 26 mmol/L (ref 22–32)
Calcium: 8.8 mg/dL — ABNORMAL LOW (ref 8.9–10.3)
Chloride: 96 mmol/L — ABNORMAL LOW (ref 98–111)
Creatinine, Ser: 6.51 mg/dL — ABNORMAL HIGH (ref 0.44–1.00)
GFR calc Af Amer: 7 mL/min — ABNORMAL LOW (ref >60)
GFR calc non Af Amer: 6 mL/min — ABNORMAL LOW (ref >60)
Glucose, Bld: 51 mg/dL — ABNORMAL LOW (ref 70–99)
Phosphorus: 2.3 mg/dL — ABNORMAL LOW (ref 2.5–4.6)
Potassium: 4.8 mmol/L (ref 3.5–5.1)
Sodium: 137 mmol/L (ref 135–145)

## 2018-09-11 LAB — GLUCOSE, CAPILLARY
Glucose-Capillary: 67 mg/dL — ABNORMAL LOW (ref 70–99)
Glucose-Capillary: 64 mg/dL — ABNORMAL LOW (ref 70–99)
Glucose-Capillary: 94 mg/dL (ref 70–99)
Glucose-Capillary: 67 mg/dL — ABNORMAL LOW (ref 70–99)
Glucose-Capillary: 68 mg/dL — ABNORMAL LOW (ref 70–99)
Glucose-Capillary: 96 mg/dL (ref 70–99)
Glucose-Capillary: 69 mg/dL — ABNORMAL LOW (ref 70–99)

## 2018-09-11 LAB — LACTATE DEHYDROGENASE: LDH: 298 U/L — ABNORMAL HIGH (ref 98–192)

## 2018-09-11 LAB — AMMONIA: Ammonia: 11 umol/L (ref 9–35)

## 2018-09-11 LAB — T4, FREE: Free T4: 0.94 ng/dL (ref 0.61–1.12)

## 2018-09-11 LAB — TSH: TSH: 3.665 u[IU]/mL (ref 0.350–4.500)

## 2018-09-11 MED ORDER — INSULIN GLARGINE 100 UNIT/ML ~~LOC~~ SOLN
15.0000 [IU] | Freq: Every day | SUBCUTANEOUS | Status: DC
  Filled 2018-09-11 (×2): qty 0.15

## 2018-09-11 MED ORDER — INSULIN ASPART 100 UNIT/ML ~~LOC~~ SOLN: 0.0000 [IU] | Freq: Every day | SUBCUTANEOUS | 1 refills | Status: DC

## 2018-09-11 MED ORDER — AMIODARONE HCL 200 MG PO TABS: ORAL_TABLET | ORAL | 0 refills | Status: DC

## 2018-09-11 MED ORDER — HYDROCORTISONE 5 MG PO TABS: ORAL_TABLET | ORAL | 0 refills | Status: DC

## 2018-09-11 MED ORDER — INSULIN GLARGINE 100 UNIT/ML ~~LOC~~ SOLN: 10.0000 [IU] | Freq: Every day | SUBCUTANEOUS | 11 refills | Status: DC

## 2018-09-11 MED ORDER — MIRTAZAPINE 15 MG PO TABS: 15.0000 mg | ORAL_TABLET | Freq: Every day | ORAL | 1 refills | Status: DC

## 2018-09-11 MED ORDER — PREDNISONE 10 MG PO TABS: ORAL_TABLET | ORAL | 0 refills | Status: DC

## 2018-09-11 MED ORDER — MIRTAZAPINE 15 MG PO TABS
15.0000 mg | ORAL_TABLET | Freq: Every day | ORAL | Status: DC
  Administered 2018-09-11: 15 mg via ORAL
  Filled 2018-09-11: qty 1

## 2018-09-11 MED ORDER — HYDROCORTISONE 5 MG PO TABS
15.0000 mg | ORAL_TABLET | Freq: Two times a day (BID) | ORAL | Status: DC
  Administered 2018-09-11: 15 mg via ORAL
  Filled 2018-09-11 (×3): qty 1

## 2018-09-11 MED ORDER — INSULIN ASPART 100 UNIT/ML ~~LOC~~ SOLN: 0.0000 [IU] | Freq: Three times a day (TID) | SUBCUTANEOUS | 1 refills | Status: DC

## 2018-09-11 MED ORDER — DEXTROSE 50 % IV SOLN
12.5000 g | INTRAVENOUS | Status: AC
  Administered 2018-09-11: 12.5 g via INTRAVENOUS
  Filled 2018-09-11: qty 50

## 2018-09-11 MED ORDER — DEXTROSE 50 % IV SOLN
INTRAVENOUS | Status: AC
  Administered 2018-09-11: 50 mL
  Filled 2018-09-11: qty 50

## 2018-09-11 NOTE — Progress Notes (Signed)
Renal Navigator notified OP HD clinic/[place] isolation COVID shift of patient's discharge to SNF today, 09/11/18 to provide continuity of care. She will start in the clinic on 09/13/18. Clinic, Nephrologist and CSW aware.  Alphonzo Cruise, [place] Renal Navigator [phone]

## 2018-09-11 NOTE — TOC Transition Note (Signed)
Transition of Care [facility]) - CM/SW Discharge Note   Patient Details  Name: Jamie Arias MRN: [id number] Date of Birth: Feb 09, 1956  Transition of [facility]) CM/SW Contact:  Gabrielle Dare Phone Number: 09/11/2018, 3:31 PM   Clinical Narrative:   Patient will Discharge SW:[facility]PRFF [facility] in [place] Date:09/11/2018 Family Notified:yes, spouse Jamie Arias Transport MB:WGYK   Per MD patient ready for DC to [facility] in [place], patient, patient's family, and facility notified of DC. Assessment, Fl2/Pasrr, and Discharge Summary sent to facility. RN given number for report [phone], Room 118). DC packet on chart. Ambulance transport requested for patient.   CSW signing off.  Reed Breech LCSWA [phone]      Final next level of care: Skilled Nursing Facility Barriers to Discharge: No Barriers Identified   Patient Goals and CMS Choice Patient states their goals for this hospitalization and ongoing recovery are:: Pt is agreeable to SNF CMS Medicare.gov Compare Post Acute Care list provided to:: Patient Choice offered to / list presented to : Patient  Discharge Placement              Patient chooses bed at: [facility] in [place]) Patient to be transferred to facility by: [place] Name of family member notified: Jamie Arias Patient and family notified of of transfer: 09/11/18  Discharge Plan and Services In-house Referral: Clinical Social Work Discharge Planning Services: NA Post Acute Care Choice: [place]          DME Arranged: N/A DME Agency: NA       HH Arranged: NA HH Agency: NA        Social Determinants of Health (SDOH) Interventions     Readmission Risk Interventions No flowsheet data found.

## 2018-09-11 NOTE — Progress Notes (Signed)
Follow-up: CBG's In 60's most of day. Notified that CBG at 2128 was 69. SNF not comfortable receiving pt with CBG's below normal. Will change d/c date to 09/12/2018 and monitor CBG's q4h overnight. Rounding MD can re-evaluate for d/c in am.   Jeryl Columbia, NP-C [facility]  Pager [phone]

## 2018-09-11 NOTE — Discharge Summary (Signed)
Physician Discharge Summary  Jamie Arias:[id number] DOB: February 27, 1956 DOA: 08/23/2018  PCP: [name], MD  Admit date: 08/23/2018 Discharge date: 09/11/2018  Admitted From: Home Disposition: SNF  Recommendations for Outpatient Follow-up:  1. Follow up with PCP and cardiology in 1-2 weeks 2. May consider low-dose Lantus if CBG elevated on steroid. 3. Please obtain CBC/BMP/Mag at follow up 4. Please follow up on the following pending results: None  Home Health: Not applicable Equipment/Devices: Not applicable  Discharge Condition: Stable CODE STATUS: Full code  Hospital Course: 63 y.o.femalewith medical history significant ofESRD on HDMWF,anemia, chronic diastolic congestive heart failure, CAD status post PCI, type 2 diabetes, hypertension, hyperlipidemia, hypothyroidism, and conditions listed belowpresenting as a transfer from [facility]. COVID-19positive. Patient transferred to [facility] for management of COVID-19 and hemodialysis.  Patient discharged to SNF in [facility] and CLIP'd SNF closer to her outpatient dialysis center but stayed for one more HD session.  See individual problem list below for more.  Discharge Diagnoses:  ESRD/bone mineral disorder -Cleared by nephrology for discharge. -CLIPP'd to outpatient HD center closer to SNF in -MWF  Acute respiratory failure due to COVID-19 related pneumonia -On room air -Discharge on oral prednisone taper partly due to concern for adrenal insufficiency after prolonged steroid therapy  New A. Fib: Rhythm controlled with amiodarone. No anticoagulation given elevated risk of bleeding from COVID-19 per cardiology.  Appreciate cardiology's help. -Per card note on 08/29/2018: amiodarone '400mg'$  BID x7d, then 200 mg BID x2wks, then 200 mg/day   CAD status post CABG: Stable -Continue home cardiac meds, statin and aspirin  Diastolic CHF: Stable. -Fluid management with HD -Lasix on nondialysis days.   -Echocardiogram in the future once she recovers from [place].  Hypotension:  continues to have soft blood pressures intermittently. -Continue Midodrine -Steroid taper as above  Generalized weakness/deconditioning -Ongoing PT/OT at SNF  Hypothyroidism -Continue home Synthroid  DM-2 with hypoglycemia: Hypoglycemia resolved with dextrose and chips.  -Continue sliding scale -Discontinue Lantus -Steroid as above  Depression -Continue Prozac and trazodone -Added nightly Remeron given poor appetite  Discharge Instructions   Allergies as of 09/11/2018      Reactions   Nsaids Other (See Comments)   Contraindicated due to kidney disease.   Doxycycline Other (See Comments)   tremor      Medication List    STOP taking these medications   Basaglar KwikPen 100 UNIT/ML Sopn Replaced by: insulin glargine 100 UNIT/ML injection   gabapentin 300 MG capsule Commonly known as: NEURONTIN   Insulin Pen Needle 31G X 5 MM Misc Commonly known as: Global Ease Inject Pen Needles     TAKE these medications   Accu-Chek Aviva Plus w/Device Kit Use as directed to test blood sugar up to three times daily   Accu-Chek FastClix Lancets Misc Check blood glucose twice daily, diagnosis code E11.9   accu-chek soft touch lancets Use as instructed   Accu-Chek Softclix Lancet Dev Kit Use as directed to test blood sugar up to three times daily   acetaminophen 325 MG tablet Commonly known as: TYLENOL Take 650 mg by mouth daily as needed for moderate pain or headache.   Alirocumab 75 MG/ML Soaj Commonly known as: Praluent Inject 75 mg into the skin every 14 (fourteen) days.   amiodarone 200 MG tablet Commonly known as: PACERONE Take 1 tablet (200 mg total) by mouth 2 (two) times daily for 10 days, THEN 1 tablet (200 mg total) daily. Start taking on: September 11, 2018   ascorbic  acid 500 MG tablet Commonly known as: VITAMIN C Take 1 tablet (500 mg total) by mouth daily.   aspirin EC 81 MG  tablet Take 81 mg by mouth daily.   atorvastatin 80 MG tablet Commonly known as: LIPITOR Take 1 tablet (80 mg total) by mouth daily at 6 PM.   blood glucose meter kit and supplies Kit Dispense based on patient and insurance preference. Use up to twice daily. (FOR ICD-10 E11.9)   Calcium Acetate 668 (169 Ca) MG Tabs Take 2,004 mg by mouth 3 (three) times daily with meals.   ergocalciferol 1.25 MG (50000 UT) capsule Commonly known as: VITAMIN D2 Take 50,000 Units by mouth once a week.   feeding supplement (PRO-STAT SUGAR FREE 64) Liqd Take 30 mLs by mouth 3 (three) times daily between meals.   ferrous sulfate 325 (65 FE) MG tablet Take 325 mg by mouth daily with breakfast.   FLUoxetine 20 MG capsule Commonly known as: PROZAC Take 1 capsule (20 mg total) by mouth daily.   fluticasone 50 MCG/ACT nasal spray Commonly known as: FLONASE Place 2 sprays into both nostrils daily.   furosemide 20 MG tablet Commonly known as: LASIX Take 40 mg by mouth every other day. Non-dialysis days   glucose blood test strip Commonly known as: Accu-Chek Aviva Use as instructed to test blood sugar up to 3 times daily   insulin aspart 100 UNIT/ML injection Commonly known as: novoLOG Inject 0-9 Units into the skin 3 (three) times daily with meals.   insulin aspart 100 UNIT/ML injection Commonly known as: novoLOG Inject 0-5 Units into the skin at bedtime.   insulin glargine 100 UNIT/ML injection Commonly known as: LANTUS Inject 0.1 mLs (10 Units total) into the skin daily. If CBG greater than 150 consistently Replaces: Basaglar KwikPen 100 UNIT/ML Sopn   levothyroxine 50 MCG tablet Commonly known as: SYNTHROID Take 1 tablet (50 mcg total) by mouth daily at 6 (six) AM. What changed:   medication strength  how much to take  how to take this  when to take this  additional instructions   lidocaine-prilocaine cream Commonly known as: EMLA Apply 1 application topically as needed  (port access).   midodrine 10 MG tablet Commonly known as: PROAMATINE Take 1 tablet (10 mg total) by mouth 3 (three) times daily with meals. What changed:   how much to take  how to take this  when to take this  additional instructions  Another medication with the same name was removed. Continue taking this medication, and follow the directions you see here.   mirtazapine 15 MG tablet Commonly known as: REMERON Take 1 tablet (15 mg total) by mouth at bedtime.   multivitamin Tabs tablet Take 1 tablet by mouth at bedtime.   nitroGLYCERIN 0.4 MG SL tablet Commonly known as: NITROSTAT DISSOLVE ONE TABLET UNDER THE TONGUE EVERY 5 MINUTES AS NEEDED FOR CHEST PAIN.  DO NOT EXCEED A TOTAL OF 3 DOSES IN 15 MINUTES What changed: See the new instructions.   omeprazole 20 MG capsule Commonly known as: PRILOSEC Take 20 mg by mouth 2 (two) times daily before a meal.   ondansetron 4 MG disintegrating tablet Commonly known as: ZOFRAN-ODT Take 4 mg by mouth every 8 (eight) hours as needed for nausea or vomiting.   polyethylene glycol 17 g packet Commonly known as: MIRALAX / GLYCOLAX Take 17 g by mouth daily.   predniSONE 10 MG tablet Commonly known as: DELTASONE Take 4 tablets (40 mg total) by mouth  daily for 3 days, THEN 3 tablets (30 mg total) daily for 3 days, THEN 2 tablets (20 mg total) daily for 3 days, THEN 1 tablet (10 mg total) daily for 3 days, THEN 0.5 tablets (5 mg total) daily for 3 days. Start taking on: September 11, 2018   traZODone 50 MG tablet Commonly known as: DESYREL TAKE 1 TABLET BY MOUTH AT BEDTIME   zinc sulfate 220 (50 Zn) MG capsule Take 1 capsule (220 mg total) by mouth daily.            Durable Medical Equipment  (From admission, onward)         Start     Ordered   08/30/18 0551  For home use only DME Walker rolling  Once    Question:  Patient needs a walker to treat with the following condition  Answer:  Ambulatory dysfunction   08/30/18 0550    08/28/18 1303  For home use only DME Bedside commode  Once    Comments: 3 n 1  Question:  Patient needs a bedside commode to treat with the following condition  Answer:  Ambulatory dysfunction   08/28/18 1303         Follow-up Information    [name], MD. Schedule an appointment as soon as possible for a visit in 1 week(s).   Specialty: Family Medicine Contact information: [address] [place] [address] [phone]        Wellington Hampshire, MD .   Specialty: Cardiology Contact information: [place] [address] [phone]           Consultations:  Nephrology  Procedures/Studies:  2D Echo: None  Ct Head Wo Contrast  Result Date: 08/23/2018 CLINICAL DATA:  Dizziness and weakness with tremors. Dialysis patient. EXAM: CT HEAD WITHOUT CONTRAST TECHNIQUE: Contiguous axial images were obtained from the base of the skull through the vertex without intravenous contrast. COMPARISON:  09/04/2017 FINDINGS: Brain: Ventricles, cisterns and other CSF spaces are within normal. There is no mass, mass effect, shift of midline structures or acute hemorrhage. There is mild chronic ischemic microvascular disease present. Vascular: No hyperdense vessel or unexpected calcification. Skull: Normal. Negative for fracture or focal lesion. Sinuses/Orbits: Orbits are normal. Minimal mucosal membrane thickening over the paranasal sinuses. Mastoid air cells are clear. Other: None. IMPRESSION: No acute findings. Mild chronic ischemic microvascular disease. Minimal chronic sinus inflammatory change. Electronically Signed   By: Marin Olp M.D.   On: 08/23/2018 12:18   Ct Chest Wo Contrast  Result Date: 08/24/2018 CLINICAL DATA:  COVID-19 positive, dyspnea EXAM: CT CHEST WITHOUT CONTRAST TECHNIQUE: Multidetector CT imaging of the chest was performed following the standard protocol without IV contrast. COMPARISON:  Chest radiograph from one day  prior. 10/23/2015 chest CT. FINDINGS: Cardiovascular: Top-normal heart size. No significant pericardial effusion/thickening. Three-vessel coronary atherosclerosis status post CABG. Atherosclerotic nonaneurysmal thoracic aorta. Dilated main pulmonary artery (3.7 cm diameter). Partially visualized right axillary and proximal right upper extremity venous stents. Mediastinum/Nodes: No discrete thyroid nodules. Unremarkable esophagus. No pathologically enlarged axillary, mediastinal or hilar lymph nodes, noting limited sensitivity for the detection of hilar adenopathy on this noncontrast study. Lungs/Pleura: No pneumothorax. No pleural effusion. Chronic rounded atelectasis in dependent basilar left lower lobe adjacent to mild left pleural thickening. Patchy ground-glass opacities throughout both lungs involving all lung lobes, most prominent in the left upper lobe. No central airway stenoses. No discrete lung masses or significant pulmonary nodules. Upper abdomen: Cholecystectomy.  Musculoskeletal: No aggressive appearing focal osseous lesions. Intact sternotomy wires. Moderate thoracic spondylosis. IMPRESSION: 1. Patchy ground-glass opacities throughout both lungs involving all lung lobes, most prominent in the left upper lobe, compatible with known COVID-19 pneumonia. There are a spectrum of findings in the lungs which can be seen with acute atypical infection (as well as other non-infectious etiologies). In particular, viral pneumonia (including COVID-19) should be considered in the appropriate clinical setting. 2. Chronic rounded atelectasis in the dependent left lung base with chronic smooth left pleural thickening. 3. Dilated main pulmonary artery, suggesting pulmonary arterial hypertension. Aortic Atherosclerosis (ICD10-I70.0). Electronically Signed   By: Ilona Sorrel M.D.   On: 08/24/2018 08:33   Dg Chest Portable 1 View  Result Date: 08/23/2018 CLINICAL DATA:  Dizziness and weakness.  Dialysis. EXAM: PORTABLE  CHEST 1 VIEW COMPARISON:  09/04/2017 FINDINGS: Lungs are adequately inflated without lobar consolidation or effusion. Mild stable cardiomegaly. Remainder of the exam is unchanged. IMPRESSION: No acute cardiopulmonary disease. Mild stable cardiomegaly. Electronically Signed   By: Marin Olp M.D.   On: 08/23/2018 10:25     Subjective: Patient had hypoglycemic event to 51 last night.  Also desaturated to 70s briefly but recovered.  Had a short episode of a flutter that has resolved spontaneously.  She is already on amiodarone.  CBG improved with dextrose and potato chips.  She has poor p.o. intake.  Insulin doses adjusted.  Started on prednisone and Remeron.    Discharge Exam: Vitals:   09/11/18 0833 09/11/18 0900  BP: (!) 75/52 (!) 112/50  Pulse: 71   Resp: 17   Temp: (!) 97.3 F (36.3 C)   SpO2:  100%    GENERAL: No acute distress.  Appears well.  HEENT: MMM.  Vision and hearing grossly intact.  NECK: Supple.  Difficult to assess JVD. LUNGS:  No IWOB. Good air movement bilaterally. HEART:  RRR. Heart sounds normal.  ABD: Bowel sounds present. Soft. Non tender.  MSK/EXT:  Moves all extremities. No apparent deformity. No edema bilaterally. SKIN: no apparent skin lesion or wound NEURO: Awake, alert and oriented x4- date.  No gross deficit.  PSYCH: Flat affect.  The results of significant diagnostics from this hospitalization (including imaging, microbiology, ancillary and laboratory) are listed below for reference.     Microbiology: Recent Results (from the past 240 hour(s))  SARS Coronavirus 2 (CEPHEID - Performed in [facility] hospital lab), Hosp Order     Status: Abnormal   Collection Time: 09/03/18  1:41 PM   Specimen: Nasopharyngeal Swab  Result Value Ref Range Status   SARS Coronavirus 2 POSITIVE (A) NEGATIVE Final    Comment: CRITICAL VALUE NOTED.  VALUE IS CONSISTENT WITH PREVIOUSLY REPORTED AND CALLED VALUE. (NOTE) If result is NEGATIVE SARS-CoV-2 target nucleic  acids are NOT DETECTED. The SARS-CoV-2 RNA is generally detectable in upper and lower  respiratory specimens during the acute phase of infection. The lowest  concentration of SARS-CoV-2 viral copies this assay can detect is 250  copies / mL. A negative result does not preclude SARS-CoV-2 infection  and should not be used as the sole basis for treatment or other  patient management decisions.  A negative result may occur with  improper specimen collection / handling, submission of specimen other  than nasopharyngeal swab, presence of viral mutation(s) within the  areas targeted by this assay, and inadequate number of viral copies  (<250 copies / mL). A negative result must be combined with clinical  observations, patient history, and epidemiological  information. If result is POSITIVE SARS-CoV-2 target nucleic acids are DETECTED. Th e SARS-CoV-2 RNA is generally detectable in upper and lower  respiratory specimens during the acute phase of infection.  Positive  results are indicative of active infection with SARS-CoV-2.  Clinical  correlation with patient history and other diagnostic information is  necessary to determine patient infection status.  Positive results do  not rule out bacterial infection or co-infection with other viruses. If result is PRESUMPTIVE POSTIVE SARS-CoV-2 nucleic acids MAY BE PRESENT.   A presumptive positive result was obtained on the submitted specimen  and confirmed on repeat testing.  While 2019 novel coronavirus  (SARS-CoV-2) nucleic acids may be present in the submitted sample  additional confirmatory testing may be necessary for epidemiological  and / or clinical management purposes  to differentiate between  SARS-CoV-2 and other Sarbecovirus currently known to infect humans.  If clinically indicated additional testing with an alternate test  methodology [phone]) is  advised. The SARS-CoV-2 RNA is generally  detectable in upper and lower respiratory  specimens during the acute  phase of infection. The expected result is Negative. Fact Sheet for Patients:  StrictlyIdeas.no Fact Sheet for Healthcare Providers: BankingDealers.co.za This test is not yet approved or cleared by the [place] FDA and has been authorized for detection and/or diagnosis of SARS-CoV-2 by FDA under an Emergency Use Authorization (EUA).  This EUA will remain in effect (meaning this test can be used) for the duration of the COVID-19 declaration under Section 564(b)(1) of the Act, 21 U.S.C. section 360bbb-3(b)(1), unless the authorization is terminated or revoked sooner. Performed at [facility], [place] [address], [place], [place] [address]   SARS Coronavirus 2 (CEPHEID- Performed in [facility] hospital lab), Hosp Order     Status: Abnormal   Collection Time: 09/08/18  4:34 PM   Specimen: Nasopharyngeal Swab  Result Value Ref Range Status   SARS Coronavirus 2 POSITIVE (A) NEGATIVE Final    Comment: RESULT CALLED TO, READ BACK BY AND VERIFIED WITH: Guerry Bruin RN 09/08/18 1738 JDW (NOTE) If result is NEGATIVE SARS-CoV-2 target nucleic acids are NOT DETECTED. The SARS-CoV-2 RNA is generally detectable in upper and lower  respiratory specimens during the acute phase of infection. The lowest  concentration of SARS-CoV-2 viral copies this assay can detect is 250  copies / mL. A negative result does not preclude SARS-CoV-2 infection  and should not be used as the sole basis for treatment or other  patient management decisions.  A negative result may occur with  improper specimen collection / handling, submission of specimen other  than nasopharyngeal swab, presence of viral mutation(s) within the  areas targeted by this assay, and inadequate number of viral copies  (<250 copies / mL). A negative result must be combined with clinical  observations, patient history, and epidemiological information. If result is  POSITIVE SARS-CoV-2 target nucleic acids are DETECTED. The S ARS-CoV-2 RNA is generally detectable in upper and lower  respiratory specimens during the acute phase of infection.  Positive  results are indicative of active infection with SARS-CoV-2.  Clinical  correlation with patient history and other diagnostic information is  necessary to determine patient infection status.  Positive results do  not rule out bacterial infection or co-infection with other viruses. If result is PRESUMPTIVE POSTIVE SARS-CoV-2 nucleic acids MAY BE PRESENT.   A presumptive positive result was obtained on the submitted specimen  and confirmed on repeat testing.  While 2019 novel coronavirus  (SARS-CoV-2)  nucleic acids may be present in the submitted sample  additional confirmatory testing may be necessary for epidemiological  and / or clinical management purposes  to differentiate between  SARS-CoV-2 and other Sarbecovirus currently known to infect humans.  If clinically indicated additional testing with an alternate test  methodology [phone]) is adv ised. The SARS-CoV-2 RNA is generally  detectable in upper and lower respiratory specimens during the acute  phase of infection. The expected result is Negative. Fact Sheet for Patients:  StrictlyIdeas.no Fact Sheet for Healthcare Providers: BankingDealers.co.za This test is not yet approved or cleared by the [place] FDA and has been authorized for detection and/or diagnosis of SARS-CoV-2 by FDA under an Emergency Use Authorization (EUA).  This EUA will remain in effect (meaning this test can be used) for the duration of the COVID-19 declaration under Section 564(b)(1) of the Act, 21 U.S.C. section 360bbb-3(b)(1), unless the authorization is terminated or revoked sooner. Performed at [facility], [place] [address] [place], [place] [address]      Labs: BNP (last 3 results) No results for  input(s): BNP in the last 8760 hours. Basic Metabolic Panel: Recent Labs  Lab 09/07/18 0854 09/07/18 1626 09/08/18 0500 09/09/18 0629 09/10/18 0822 09/11/18 0458  NA 137  --  142 140 135 137  K 5.9* 6.3* 4.0 3.6 4.1 4.8  CL 98  --  95* 95* 93* 96*  CO2 19*  --  '27 26 27 26  '$ GLUCOSE 75  --  54* 45* 103* 51*  BUN 53*  --  74* 61* 76* 36*  CREATININE 7.27*  --  9.87* 8.61* 10.36* 6.51*  CALCIUM 9.3  --  9.3 9.2 9.6 8.8*  PHOS 3.2  --  5.1* 4.3 3.1 2.3*   Liver Function Tests: Recent Labs  Lab 09/07/18 0854 09/08/18 0500 09/09/18 0629 09/10/18 0822 09/11/18 0458  ALBUMIN 3.4* 3.1* 3.3* 2.9* 2.9*   No results for input(s): LIPASE, AMYLASE in the last 168 hours. Recent Labs  Lab 09/11/18 1014  AMMONIA 11   CBC: Recent Labs  Lab 09/06/18 1145 09/08/18 1357 09/10/18 0822  WBC 8.7 13.3* 8.0  HGB 15.2* 12.0 11.4*  HCT 44.4 36.3 34.4*  MCV 97.2 100.8* 100.9*  PLT 157 161 125*   Cardiac Enzymes: No results for input(s): CKTOTAL, CKMB, CKMBINDEX, TROPONINI in the last 168 hours. BNP: Invalid input(s): POCBNP CBG: Recent Labs  Lab 09/10/18 2138 09/10/18 2245 09/11/18 0831 09/11/18 1034 09/11/18 1241  GLUCAP 51* 110* 67* 64* 94   D-Dimer No results for input(s): DDIMER in the last 72 hours. Hgb A1c No results for input(s): HGBA1C in the last 72 hours. Lipid Profile No results for input(s): CHOL, HDL, LDLCALC, TRIG, CHOLHDL, LDLDIRECT in the last 72 hours. Thyroid function studies Recent Labs    09/11/18 1014  TSH 3.665   Anemia work up No results for input(s): VITAMINB12, FOLATE, FERRITIN, TIBC, IRON, RETICCTPCT in the last 72 hours. Urinalysis    Component Value Date/Time   COLORURINE YELLOW (A) 08/23/2018 1209   APPEARANCEUR CLOUDY (A) 08/23/2018 1209   APPEARANCEUR Hazy 07/23/2013 0741   LABSPEC 1.016 08/23/2018 1209   LABSPEC 1.016 07/23/2013 0741   PHURINE 5.0 08/23/2018 1209   GLUCOSEU NEGATIVE 08/23/2018 1209   GLUCOSEU Negative 07/23/2013  0741   HGBUR SMALL (A) 08/23/2018 1209   BILIRUBINUR NEGATIVE 08/23/2018 1209   BILIRUBINUR Negative 07/23/2013 0741   KETONESUR NEGATIVE 08/23/2018 1209   PROTEINUR 100 (A) 08/23/2018 1209   UROBILINOGEN 0.2  06/30/2013 2213   NITRITE NEGATIVE 08/23/2018 1209   LEUKOCYTESUR SMALL (A) 08/23/2018 1209   LEUKOCYTESUR Negative 07/23/2013 0741   Sepsis Labs Invalid input(s): PROCALCITONIN,  WBC,  LACTICIDVEN   Time coordinating discharge: 45 minutes  SIGNED:  Mercy Riding, MD  [facility] 09/11/2018, 2:32 PM  If 7PM-7AM, please contact night-coverage [email or web address] Password [email or web address]

## 2018-09-11 NOTE — Progress Notes (Signed)
[facility] Progress Note   Assessment/ Plan:   # ESRD MWF:  -Patient had dialysis on Friday and 7/4  because of hyperkalemia. S/P lokelma 2 doses. HD on schedule 7/6, K much improved. - Has been accepted to SNF in [place] and therefore will need different HD unit in [facility] shift at [facility]. - tolerated full HD session in chair 7/8.  Next planned HD Saturday- has already gotten M and W treatment here.    # Anemia of chronic kidney disease: no ESA indicated  # Secondary hyperparathyroidism: Monitor phosphorus level. Continue PhosLo.   # Hypotension/volume: On midodrine for hypotension. Monitor BP--> 70s/ 50s this AM, AM cortisol obtained by primary team.  Weights are overall trending down for this admission  #COVID-19 positive: Per primary team, s/p steroids  #Altered mental status: Minimizing sedatives.  Primary team is following and seen by psychiatrist for  Depression.  # Afib: new dx, on amiodarone per cardiology.  No AC for now.    # Dispo: pending  Subjective:    Hypoxic overnight, placed on 2L.  LDH higher this AM      Objective:   BP (!) 112/50   Pulse 71   Temp (!) 97.3 F (36.3 C) (Oral)   Resp 17   Ht 5\' 4"  (1.626 m)   Wt 89.2 kg   SpO2 100%   BMI 33.76 kg/m   Physical Exam: Exam deferred to COVID + status, relying on exam of other providers and extensive chart review.  Labs: BMET Recent Labs  Lab 09/05/18 [phone] 09/06/18 0514 09/07/18 0854 09/07/18 1626 09/08/18 0500 09/09/18 0629 09/10/18 0822 09/11/18 0458  NA 138 137 137  --  142 140 135 137  K 5.3* 5.5* 5.9* 6.3* 4.0 3.6 4.1 4.8  CL 95* 95* 98  --  95* 95* 93* 96*  CO2 25 27 19*  --  27 26 27 26   GLUCOSE 91 151* 75  --  54* 45* 103* 51*  BUN 79* 62* 53*  --  74* 61* 76* 36*  CREATININE 9.11* 8.04* 7.27*  --  9.87* 8.61* 10.36* 6.51*  CALCIUM 9.4 9.5 9.3  --  9.3 9.2 9.6 8.8*  PHOS 5.7* 4.9* 3.2  --  5.1* 4.3 3.1 2.3*   CBC Recent Labs  Lab  09/06/18 1145 09/08/18 1357 09/10/18 0822  WBC 8.7 13.3* 8.0  HGB 15.2* 12.0 11.4*  HCT 44.4 36.3 34.4*  MCV 97.2 100.8* 100.9*  PLT 157 161 125*    @IMGRELPRIORS @ Medications:    . amiodarone  200 mg Oral BID  . aspirin EC  81 mg Oral Daily  . atorvastatin  80 mg Oral q1800  . calcium acetate  2,001 mg Oral TID WC  . Chlorhexidine Gluconate Cloth  6 each Topical Q0600  . feeding supplement  1 Container Oral TID BM  . feeding supplement (PRO-STAT SUGAR FREE 64)  30 mL Oral TID BM  . FLUoxetine  20 mg Oral Daily  . heparin  20 Units/kg Dialysis Once in dialysis  . heparin  5,000 Units Subcutaneous Q8H  . insulin aspart  0-5 Units Subcutaneous QHS  . insulin aspart  0-9 Units Subcutaneous TID WC  . insulin glargine  15 Units Subcutaneous Daily  . levothyroxine  50 mcg Oral Q0600  . mouth rinse  15 mL Mouth Rinse BID  . midodrine  10 mg Oral TID WC  . mirtazapine  15 mg Oral QHS  . multivitamin  1 tablet  Oral QHS  . pantoprazole  40 mg Oral Daily  . polyethylene glycol  17 g Oral Daily  . vitamin C  500 mg Oral Daily  . zinc sulfate  220 mg Oral Daily     Madelon Lips MD [facility] pgr [phone] 09/11/2018, 10:45 AM

## 2018-09-11 NOTE — Progress Notes (Signed)
[facility] informed that patient is Aflutter new onset. Arthor Captain LPN

## 2018-09-11 NOTE — Plan of Care (Signed)
  Problem: Clinical Measurements: Goal: Respiratory complications will improve Outcome: Progressing   Problem: Activity: Goal: Risk for activity intolerance will decrease Outcome: Progressing   Problem: Nutrition: Goal: Adequate nutrition will be maintained Outcome: Not Progressing   Problem: Coping: Goal: Level of anxiety will decrease Outcome: Progressing   Problem: Elimination: Goal: Will not experience complications related to bowel motility Outcome: Progressing Goal: Will not experience complications related to urinary retention Outcome: Progressing   Problem: Safety: Goal: Ability to remain free from injury will improve Outcome: Progressing   Problem: Skin Integrity: Goal: Risk for impaired skin integrity will decrease Outcome: Progressing

## 2018-09-12 LAB — GLUCOSE, CAPILLARY
Glucose-Capillary: 109 mg/dL — ABNORMAL HIGH (ref 70–99)
Glucose-Capillary: 109 mg/dL — ABNORMAL HIGH (ref 70–99)
Glucose-Capillary: 113 mg/dL — ABNORMAL HIGH (ref 70–99)
Glucose-Capillary: 143 mg/dL — ABNORMAL HIGH (ref 70–99)
Glucose-Capillary: 163 mg/dL — ABNORMAL HIGH (ref 70–99)
Glucose-Capillary: 47 mg/dL — ABNORMAL LOW (ref 70–99)
Glucose-Capillary: 58 mg/dL — ABNORMAL LOW (ref 70–99)
Glucose-Capillary: 71 mg/dL (ref 70–99)

## 2018-09-12 LAB — CBC
HCT: 37 % (ref 36.0–46.0)
Hemoglobin: 11.9 g/dL — ABNORMAL LOW (ref 12.0–15.0)
MCH: 33.1 pg (ref 26.0–34.0)
MCHC: 32.2 g/dL (ref 30.0–36.0)
MCV: 102.8 fL — ABNORMAL HIGH (ref 80.0–100.0)
Platelets: 119 10*3/uL — ABNORMAL LOW (ref 150–400)
RBC: 3.6 MIL/uL — ABNORMAL LOW (ref 3.87–5.11)
RDW: 18.9 % — ABNORMAL HIGH (ref 11.5–15.5)
WBC: 6.5 10*3/uL (ref 4.0–10.5)
nRBC: 0 % (ref 0.0–0.2)

## 2018-09-12 LAB — RENAL FUNCTION PANEL
Albumin: 2.9 g/dL — ABNORMAL LOW (ref 3.5–5.0)
Anion gap: 15 (ref 5–15)
BUN: 49 mg/dL — ABNORMAL HIGH (ref 8–23)
CO2: 25 mmol/L (ref 22–32)
Calcium: 8.7 mg/dL — ABNORMAL LOW (ref 8.9–10.3)
Chloride: 95 mmol/L — ABNORMAL LOW (ref 98–111)
Creatinine, Ser: 8.7 mg/dL — ABNORMAL HIGH (ref 0.44–1.00)
GFR calc Af Amer: 5 mL/min — ABNORMAL LOW (ref >60)
GFR calc non Af Amer: 4 mL/min — ABNORMAL LOW (ref >60)
Glucose, Bld: 45 mg/dL — ABNORMAL LOW (ref 70–99)
Phosphorus: 2.5 mg/dL (ref 2.5–4.6)
Potassium: 4.5 mmol/L (ref 3.5–5.1)
Sodium: 135 mmol/L (ref 135–145)

## 2018-09-12 MED ORDER — DEXTROSE 50 % IV SOLN
INTRAVENOUS | Status: AC
  Administered 2018-09-12: 12.5 g via INTRAVENOUS
  Filled 2018-09-12: qty 50

## 2018-09-12 MED ORDER — HYDROCORTISONE 20 MG PO TABS
20.0000 mg | ORAL_TABLET | Freq: Two times a day (BID) | ORAL | Status: DC
  Administered 2018-09-12 – 2018-09-14 (×5): 20 mg via ORAL
  Filled 2018-09-12 (×6): qty 1

## 2018-09-12 MED ORDER — VENLAFAXINE HCL 75 MG PO TABS
75.0000 mg | ORAL_TABLET | Freq: Two times a day (BID) | ORAL | Status: DC
  Administered 2018-09-12 – 2018-09-15 (×7): 75 mg via ORAL
  Filled 2018-09-12 (×9): qty 1

## 2018-09-12 MED ORDER — MIRTAZAPINE 15 MG PO TABS
30.0000 mg | ORAL_TABLET | Freq: Every day | ORAL | Status: DC
  Administered 2018-09-12 – 2018-09-14 (×3): 30 mg via ORAL
  Filled 2018-09-12 (×3): qty 2

## 2018-09-12 MED ORDER — PREDNISONE 10 MG PO TABS: ORAL_TABLET | ORAL | 0 refills | Status: AC

## 2018-09-12 MED ORDER — DULOXETINE HCL 30 MG PO CPEP: 30.0000 mg | ORAL_CAPSULE | Freq: Every day | ORAL | Status: DC

## 2018-09-12 MED ORDER — DEXTROSE 50 % IV SOLN
INTRAVENOUS | Status: AC
  Administered 2018-09-12: 50 mL
  Filled 2018-09-12: qty 50

## 2018-09-12 MED ORDER — DEXTROSE 50 % IV SOLN
12.5000 g | INTRAVENOUS | Status: AC
  Administered 2018-09-12: 12.5 g via INTRAVENOUS

## 2018-09-12 MED ORDER — VENLAFAXINE HCL 75 MG PO TABS: 75.0000 mg | ORAL_TABLET | Freq: Two times a day (BID) | ORAL | 0 refills | Status: DC

## 2018-09-12 NOTE — Discharge Summary (Addendum)
Physician Discharge Summary  JENNIPHER WEATHERHOLTZ FKC:[id number] DOB: 25-Feb-1956 DOA: 08/23/2018  PCP: [name], MD  Admit date: 08/23/2018 Discharge date: 09/12/2018  Admitted From: Home Disposition: SNF  Recommendations for Outpatient Follow-up:  1. Follow up with PCP and cardiology in 1-2 weeks 2. May consider low-dose Lantus if CBG elevated on steroid. 3. Please obtain CBC/BMP/Mag at follow up 4. Please follow up on the following pending results: None  Home Health: Not applicable Equipment/Devices: Not applicable  Discharge Condition: Stable CODE STATUS: Full code  Hospital Course: 63 y.o.femalewith medical history significant ofESRD on HDMWF,anemia, chronic diastolic congestive heart failure, CAD status post PCI, type 2 diabetes, hypertension, hyperlipidemia, hypothyroidism, and conditions listed belowpresenting as a transfer from [facility]. COVID-19positive. Patient transferred to [facility] for management of COVID-19 and hemodialysis.  Patient discharged to SNF in [facility] and CLIP'd SNF closer to her outpatient dialysis center but stayed for one more HD session, then hypoglycemia due to poor p.o. intake in the setting of acute stress disorder from current critical illness.  See individual problem list below for more.  Discharge Diagnoses:  ESRD/bone mineral disorder -Cleared by nephrology for discharge. -CLIPP'd to outpatient HD center closer to SNF in [place] -She will continue outpatient dialysis at [place] (COVID shift)  Acute respiratory failure due to COVID-19 related pneumonia -On room air -Discharge on oral prednisone taper partly due to concern for adrenal insufficiency after prolonged steroid therapy  New A. Fib: Rhythm controlled with amiodarone. No anticoagulation given elevated risk of bleeding from COVID-19 per cardiology.  Appreciate cardiology's help. -Per card note on 08/29/2018: amiodarone '400mg'$  BID x7d, then 200 mg BID  x2wks, then 200 mg/day   CAD status post CABG: Stable -Continue home cardiac meds, statin and aspirin  Diastolic CHF: Stable. -Fluid management with HD -Lasix on non-dialysis days.  -Echocardiogram in the future once she recovers from [place].  Hypotension:  continues to have soft blood pressures intermittently. -Continue Midodrine -Steroid taper as above  Generalized weakness/deconditioning: -Ongoing PT/OT at SNF  Hypothyroidism -Continue home Synthroid  DM-2 with hypoglycemia: hypoglycemia is due to poor p.o. intake in the setting of acute critical illness and acute distress disorder.  Made medication changes for acute critical illness as below.  Also resumed steroid. -Recommend liberating diet until her oral intake improves, then reinstate low-carb, low-sodium and renal diet -Continue sliding scale -Discontinued Lantus but may resume at facility if she sustains CBG greater than 150 consistently. -Steroid taper as above  Depression/Acute Stress Disorder due to critical illness -Changed Prozac to Effexor -Changed Trazodone to Remeron given poor appetite -Outpatient psych follow-up  Discharge Instructions  Discharge Instructions    Diet - low sodium heart healthy   Complete by: As directed    Renal diet with fluid restriction to 1200 cc   Diet Carb Modified   Complete by: As directed    Increase activity slowly   Complete by: As directed    Increase activity slowly   Complete by: As directed      Allergies as of 09/12/2018      Reactions   Nsaids Other (See Comments)   Contraindicated due to kidney disease.   Doxycycline Other (See Comments)   tremor      Medication List    STOP taking these medications   Basaglar KwikPen 100 UNIT/ML Sopn Replaced by: insulin glargine 100 UNIT/ML injection   gabapentin 300 MG capsule Commonly known as: NEURONTIN   Insulin Pen Needle 31G X 5 MM Misc  Commonly known as: Global Ease Inject Pen Needles   traZODone 50 MG  tablet Commonly known as: DESYREL     TAKE these medications   Accu-Chek Aviva Plus w/Device Kit Use as directed to test blood sugar up to three times daily   Accu-Chek FastClix Lancets Misc Check blood glucose twice daily, diagnosis code E11.9   accu-chek soft touch lancets Use as instructed   Accu-Chek Softclix Lancet Dev Kit Use as directed to test blood sugar up to three times daily   acetaminophen 325 MG tablet Commonly known as: TYLENOL Take 650 mg by mouth daily as needed for moderate pain or headache.   Alirocumab 75 MG/ML Soaj Commonly known as: Praluent Inject 75 mg into the skin every 14 (fourteen) days.   amiodarone 200 MG tablet Commonly known as: PACERONE Take 1 tablet (200 mg total) by mouth 2 (two) times daily for 10 days, THEN 1 tablet (200 mg total) daily. Start taking on: September 11, 2018   ascorbic acid 500 MG tablet Commonly known as: VITAMIN C Take 1 tablet (500 mg total) by mouth daily.   aspirin EC 81 MG tablet Take 81 mg by mouth daily.   atorvastatin 80 MG tablet Commonly known as: LIPITOR Take 1 tablet (80 mg total) by mouth daily at 6 PM.   blood glucose meter kit and supplies Kit Dispense based on patient and insurance preference. Use up to twice daily. (FOR ICD-10 E11.9)   Calcium Acetate 668 (169 Ca) MG Tabs Take 2,004 mg by mouth 3 (three) times daily with meals.   ergocalciferol 1.25 MG (50000 UT) capsule Commonly known as: VITAMIN D2 Take 50,000 Units by mouth once a week.   feeding supplement (PRO-STAT SUGAR FREE 64) Liqd Take 30 mLs by mouth 3 (three) times daily between meals.   ferrous sulfate 325 (65 FE) MG tablet Take 325 mg by mouth daily with breakfast.   fluticasone 50 MCG/ACT nasal spray Commonly known as: FLONASE Place 2 sprays into both nostrils daily.   furosemide 20 MG tablet Commonly known as: LASIX Take 40 mg by mouth every other day. Non-dialysis days   glucose blood test strip Commonly known as:  Accu-Chek Aviva Use as instructed to test blood sugar up to 3 times daily   insulin aspart 100 UNIT/ML injection Commonly known as: novoLOG Inject 0-9 Units into the skin 3 (three) times daily with meals.   insulin aspart 100 UNIT/ML injection Commonly known as: novoLOG Inject 0-5 Units into the skin at bedtime.   insulin glargine 100 UNIT/ML injection Commonly known as: LANTUS Inject 0.1 mLs (10 Units total) into the skin daily. If CBG greater than 150 consistently Replaces: Basaglar KwikPen 100 UNIT/ML Sopn   levothyroxine 50 MCG tablet Commonly known as: SYNTHROID Take 1 tablet (50 mcg total) by mouth daily at 6 (six) AM. What changed:   medication strength  how much to take  how to take this  when to take this  additional instructions   lidocaine-prilocaine cream Commonly known as: EMLA Apply 1 application topically as needed (port access).   midodrine 10 MG tablet Commonly known as: PROAMATINE Take 1 tablet (10 mg total) by mouth 3 (three) times daily with meals. What changed:   how much to take  how to take this  when to take this  additional instructions  Another medication with the same name was removed. Continue taking this medication, and follow the directions you see here.   mirtazapine 15 MG tablet Commonly known as:  REMERON Take 1 tablet (15 mg total) by mouth at bedtime.   multivitamin Tabs tablet Take 1 tablet by mouth at bedtime.   nitroGLYCERIN 0.4 MG SL tablet Commonly known as: NITROSTAT DISSOLVE ONE TABLET UNDER THE TONGUE EVERY 5 MINUTES AS NEEDED FOR CHEST PAIN.  DO NOT EXCEED A TOTAL OF 3 DOSES IN 15 MINUTES What changed: See the new instructions.   omeprazole 20 MG capsule Commonly known as: PRILOSEC Take 20 mg by mouth 2 (two) times daily before a meal.   ondansetron 4 MG disintegrating tablet Commonly known as: ZOFRAN-ODT Take 4 mg by mouth every 8 (eight) hours as needed for nausea or vomiting.   polyethylene glycol 17 g  packet Commonly known as: MIRALAX / GLYCOLAX Take 17 g by mouth daily.   predniSONE 10 MG tablet Commonly known as: DELTASONE Take 4 tablets (40 mg total) by mouth daily for 2 days, THEN 3 tablets (30 mg total) daily for 2 days, THEN 2 tablets (20 mg total) daily for 2 days, THEN 1 tablet (10 mg total) daily for 2 days, THEN 0.5 tablets (5 mg total) daily for 3 days. Start taking on: September 12, 2018   venlafaxine 75 MG tablet Commonly known as: EFFEXOR Take 1 tablet (75 mg total) by mouth 2 (two) times daily with a meal.   zinc sulfate 220 (50 Zn) MG capsule Take 1 capsule (220 mg total) by mouth daily.            Durable Medical Equipment  (From admission, onward)         Start     Ordered   08/30/18 0551  For home use only DME Walker rolling  Once    Question:  Patient needs a walker to treat with the following condition  Answer:  Ambulatory dysfunction   08/30/18 0550   08/28/18 1303  For home use only DME Bedside commode  Once    Comments: 3 n 1  Question:  Patient needs a bedside commode to treat with the following condition  Answer:  Ambulatory dysfunction   08/28/18 1303         Follow-up Information    [name], MD. Schedule an appointment as soon as possible for a visit in 1 week(s).   Specialty: Family Medicine Contact information: [address] [name] [address] [phone]        Wellington Hampshire, MD .   Specialty: Cardiology Contact information: [place] [address] [phone]           Consultations:  Nephrology  Procedures/Studies:  2D Echo: None  Ct Head Wo Contrast  Result Date: 08/23/2018 CLINICAL DATA:  Dizziness and weakness with tremors. Dialysis patient. EXAM: CT HEAD WITHOUT CONTRAST TECHNIQUE: Contiguous axial images were obtained from the base of the skull through the vertex without intravenous contrast. COMPARISON:  09/04/2017 FINDINGS: Brain: Ventricles,  cisterns and other CSF spaces are within normal. There is no mass, mass effect, shift of midline structures or acute hemorrhage. There is mild chronic ischemic microvascular disease present. Vascular: No hyperdense vessel or unexpected calcification. Skull: Normal. Negative for fracture or focal lesion. Sinuses/Orbits: Orbits are normal. Minimal mucosal membrane thickening over the paranasal sinuses. Mastoid air cells are clear. Other: None. IMPRESSION: No acute findings. Mild chronic ischemic microvascular disease. Minimal chronic sinus inflammatory change. Electronically Signed   By: Marin Olp M.D.   On: 08/23/2018 12:18   Ct Chest Wo Contrast  Result  Date: 08/24/2018 CLINICAL DATA:  COVID-19 positive, dyspnea EXAM: CT CHEST WITHOUT CONTRAST TECHNIQUE: Multidetector CT imaging of the chest was performed following the standard protocol without IV contrast. COMPARISON:  Chest radiograph from one day prior. 10/23/2015 chest CT. FINDINGS: Cardiovascular: Top-normal heart size. No significant pericardial effusion/thickening. Three-vessel coronary atherosclerosis status post CABG. Atherosclerotic nonaneurysmal thoracic aorta. Dilated main pulmonary artery (3.7 cm diameter). Partially visualized right axillary and proximal right upper extremity venous stents. Mediastinum/Nodes: No discrete thyroid nodules. Unremarkable esophagus. No pathologically enlarged axillary, mediastinal or hilar lymph nodes, noting limited sensitivity for the detection of hilar adenopathy on this noncontrast study. Lungs/Pleura: No pneumothorax. No pleural effusion. Chronic rounded atelectasis in dependent basilar left lower lobe adjacent to mild left pleural thickening. Patchy ground-glass opacities throughout both lungs involving all lung lobes, most prominent in the left upper lobe. No central airway stenoses. No discrete lung masses or significant pulmonary nodules. Upper abdomen: Cholecystectomy. Musculoskeletal: No aggressive  appearing focal osseous lesions. Intact sternotomy wires. Moderate thoracic spondylosis. IMPRESSION: 1. Patchy ground-glass opacities throughout both lungs involving all lung lobes, most prominent in the left upper lobe, compatible with known COVID-19 pneumonia. There are a spectrum of findings in the lungs which can be seen with acute atypical infection (as well as other non-infectious etiologies). In particular, viral pneumonia (including COVID-19) should be considered in the appropriate clinical setting. 2. Chronic rounded atelectasis in the dependent left lung base with chronic smooth left pleural thickening. 3. Dilated main pulmonary artery, suggesting pulmonary arterial hypertension. Aortic Atherosclerosis (ICD10-I70.0). Electronically Signed   By: Ilona Sorrel M.D.   On: 08/24/2018 08:33   Dg Chest Portable 1 View  Result Date: 08/23/2018 CLINICAL DATA:  Dizziness and weakness.  Dialysis. EXAM: PORTABLE CHEST 1 VIEW COMPARISON:  09/04/2017 FINDINGS: Lungs are adequately inflated without lobar consolidation or effusion. Mild stable cardiomegaly. Remainder of the exam is unchanged. IMPRESSION: No acute cardiopulmonary disease. Mild stable cardiomegaly. Electronically Signed   By: Marin Olp M.D.   On: 08/23/2018 10:25     Subjective: She had hypoglycemic event to 47 early this morning.  Hypoglycemia resolved with dextrose.  She feels depressed.  She stated that she never been sick like this before.  She states she just likes to go home be with family but realizes that this is not possible at this time.  She says she has 3 children and grandchildren's.  She says she feels hungry but does not feel like eating.  She denies chest pain, dyspnea or abdominal pain.    Discharge Exam: Vitals:   09/11/18 1701 09/12/18 0900  BP: 131/73 132/70  Pulse: 93 90  Resp: 19 18  Temp:  98.3 F (36.8 C)  SpO2: 100% 98%    GENERAL: No acute distress.  Appears well.  HEENT: MMM.  Vision and hearing grossly  intact.  NECK: Supple.  Difficult to assess JVD. LUNGS:  No IWOB. Good air movement bilaterally. HEART:  RRR. Heart sounds normal.  ABD: Bowel sounds present. Soft. Non tender.  MSK/EXT:  Moves all extremities. No apparent deformity. No edema bilaterally. SKIN: no apparent skin lesion or wound NEURO: Awake, alert and oriented x4- date.  No gross deficit.  PSYCH: Flat affect but better today.  The results of significant diagnostics from this hospitalization (including imaging, microbiology, ancillary and laboratory) are listed below for reference.     Microbiology: Recent Results (from the past 240 hour(s))  SARS Coronavirus 2 (CEPHEID - Performed in [place] hospital lab), [facility]  Status: Abnormal   Collection Time: 09/03/18  1:41 PM   Specimen: Nasopharyngeal Swab  Result Value Ref Range Status   SARS Coronavirus 2 POSITIVE (A) NEGATIVE Final    Comment: CRITICAL VALUE NOTED.  VALUE IS CONSISTENT WITH PREVIOUSLY REPORTED AND CALLED VALUE. (NOTE) If result is NEGATIVE SARS-CoV-2 target nucleic acids are NOT DETECTED. The SARS-CoV-2 RNA is generally detectable in upper and lower  respiratory specimens during the acute phase of infection. The lowest  concentration of SARS-CoV-2 viral copies this assay can detect is 250  copies / mL. A negative result does not preclude SARS-CoV-2 infection  and should not be used as the sole basis for treatment or other  patient management decisions.  A negative result may occur with  improper specimen collection / handling, submission of specimen other  than nasopharyngeal swab, presence of viral mutation(s) within the  areas targeted by this assay, and inadequate number of viral copies  (<250 copies / mL). A negative result must be combined with clinical  observations, patient history, and epidemiological information. If result is POSITIVE SARS-CoV-2 target nucleic acids are DETECTED. Th e SARS-CoV-2 RNA is generally detectable in upper  and lower  respiratory specimens during the acute phase of infection.  Positive  results are indicative of active infection with SARS-CoV-2.  Clinical  correlation with patient history and other diagnostic information is  necessary to determine patient infection status.  Positive results do  not rule out bacterial infection or co-infection with other viruses. If result is PRESUMPTIVE POSTIVE SARS-CoV-2 nucleic acids MAY BE PRESENT.   A presumptive positive result was obtained on the submitted specimen  and confirmed on repeat testing.  While 2019 novel coronavirus  (SARS-CoV-2) nucleic acids may be present in the submitted sample  additional confirmatory testing may be necessary for epidemiological  and / or clinical management purposes  to differentiate between  SARS-CoV-2 and other Sarbecovirus currently known to infect humans.  If clinically indicated additional testing with an alternate test  methodology [phone]) is  advised. The SARS-CoV-2 RNA is generally  detectable in upper and lower respiratory specimens during the acute  phase of infection. The expected result is Negative. Fact Sheet for Patients:  StrictlyIdeas.no Fact Sheet for Healthcare Providers: BankingDealers.co.za This test is not yet approved or cleared by the [place] FDA and has been authorized for detection and/or diagnosis of SARS-CoV-2 by FDA under an Emergency Use Authorization (EUA).  This EUA will remain in effect (meaning this test can be used) for the duration of the COVID-19 declaration under Section 564(b)(1) of the Act, 21 U.S.C. section 360bbb-3(b)(1), unless the authorization is terminated or revoked sooner. Performed at [facility], [place] [address] [place], [place] [address]   SARS Coronavirus 2 (CEPHEID- Performed in [place] hospital lab), Hosp Order     Status: Abnormal   Collection Time: 09/08/18  4:34 PM   Specimen: Nasopharyngeal  Swab  Result Value Ref Range Status   SARS Coronavirus 2 POSITIVE (A) NEGATIVE Final    Comment: RESULT CALLED TO, READ BACK BY AND VERIFIED WITH: Guerry Bruin RN 09/08/18 1738 JDW (NOTE) If result is NEGATIVE SARS-CoV-2 target nucleic acids are NOT DETECTED. The SARS-CoV-2 RNA is generally detectable in upper and lower  respiratory specimens during the acute phase of infection. The lowest  concentration of SARS-CoV-2 viral copies this assay can detect is 250  copies / mL. A negative result does not preclude SARS-CoV-2 infection  and should not be used as the  sole basis for treatment or other  patient management decisions.  A negative result may occur with  improper specimen collection / handling, submission of specimen other  than nasopharyngeal swab, presence of viral mutation(s) within the  areas targeted by this assay, and inadequate number of viral copies  (<250 copies / mL). A negative result must be combined with clinical  observations, patient history, and epidemiological information. If result is POSITIVE SARS-CoV-2 target nucleic acids are DETECTED. The S ARS-CoV-2 RNA is generally detectable in upper and lower  respiratory specimens during the acute phase of infection.  Positive  results are indicative of active infection with SARS-CoV-2.  Clinical  correlation with patient history and other diagnostic information is  necessary to determine patient infection status.  Positive results do  not rule out bacterial infection or co-infection with other viruses. If result is PRESUMPTIVE POSTIVE SARS-CoV-2 nucleic acids MAY BE PRESENT.   A presumptive positive result was obtained on the submitted specimen  and confirmed on repeat testing.  While 2019 novel coronavirus  (SARS-CoV-2) nucleic acids may be present in the submitted sample  additional confirmatory testing may be necessary for epidemiological  and / or clinical management purposes  to differentiate between  SARS-CoV-2  and other Sarbecovirus currently known to infect humans.  If clinically indicated additional testing with an alternate test  methodology [phone]) is adv ised. The SARS-CoV-2 RNA is generally  detectable in upper and lower respiratory specimens during the acute  phase of infection. The expected result is Negative. Fact Sheet for Patients:  StrictlyIdeas.no Fact Sheet for Healthcare Providers: BankingDealers.co.za This test is not yet approved or cleared by the [place] FDA and has been authorized for detection and/or diagnosis of SARS-CoV-2 by FDA under an Emergency Use Authorization (EUA).  This EUA will remain in effect (meaning this test can be used) for the duration of the COVID-19 declaration under Section 564(b)(1) of the Act, 21 U.S.C. section 360bbb-3(b)(1), unless the authorization is terminated or revoked sooner. Performed at [facility], [place] [address] [place], [place] [address]      Labs: BNP (last 3 results) No results for input(s): BNP in the last 8760 hours. Basic Metabolic Panel: Recent Labs  Lab 09/08/18 0500 09/09/18 0629 09/10/18 0822 09/11/18 0458 09/12/18 0533  NA 142 140 135 137 135  K 4.0 3.6 4.1 4.8 4.5  CL 95* 95* 93* 96* 95*  CO2 '27 26 27 26 25  '$ GLUCOSE 54* 45* 103* 51* 45*  BUN 74* 61* 76* 36* 49*  CREATININE 9.87* 8.61* 10.36* 6.51* 8.70*  CALCIUM 9.3 9.2 9.6 8.8* 8.7*  PHOS 5.1* 4.3 3.1 2.3* 2.5   Liver Function Tests: Recent Labs  Lab 09/08/18 0500 09/09/18 0629 09/10/18 0822 09/11/18 0458 09/12/18 0533  ALBUMIN 3.1* 3.3* 2.9* 2.9* 2.9*   No results for input(s): LIPASE, AMYLASE in the last 168 hours. Recent Labs  Lab 09/11/18 1014  AMMONIA 11   CBC: Recent Labs  Lab 09/06/18 1145 09/08/18 1357 09/10/18 0822 09/12/18 0533  WBC 8.7 13.3* 8.0 6.5  HGB 15.2* 12.0 11.4* 11.9*  HCT 44.4 36.3 34.4* 37.0  MCV 97.2 100.8* 100.9* 102.8*  PLT 157 161 125* 119*    Cardiac Enzymes: No results for input(s): CKTOTAL, CKMB, CKMBINDEX, TROPONINI in the last 168 hours. BNP: Invalid input(s): POCBNP CBG: Recent Labs  Lab 09/11/18 2128 09/12/18 0009 09/12/18 0628 09/12/18 0737 09/12/18 1122  GLUCAP 69* 113* 47* 163* 143*   D-Dimer No results for input(s): DDIMER  in the last 72 hours. Hgb A1c No results for input(s): HGBA1C in the last 72 hours. Lipid Profile No results for input(s): CHOL, HDL, LDLCALC, TRIG, CHOLHDL, LDLDIRECT in the last 72 hours. Thyroid function studies Recent Labs    09/11/18 1014  TSH 3.665   Anemia work up No results for input(s): VITAMINB12, FOLATE, FERRITIN, TIBC, IRON, RETICCTPCT in the last 72 hours. Urinalysis    Component Value Date/Time   COLORURINE YELLOW (A) 08/23/2018 1209   APPEARANCEUR CLOUDY (A) 08/23/2018 1209   APPEARANCEUR Hazy 07/23/2013 0741   LABSPEC 1.016 08/23/2018 1209   LABSPEC 1.016 07/23/2013 0741   PHURINE 5.0 08/23/2018 1209   GLUCOSEU NEGATIVE 08/23/2018 1209   GLUCOSEU Negative 07/23/2013 0741   HGBUR SMALL (A) 08/23/2018 1209   BILIRUBINUR NEGATIVE 08/23/2018 1209   BILIRUBINUR Negative 07/23/2013 0741   KETONESUR NEGATIVE 08/23/2018 1209   PROTEINUR 100 (A) 08/23/2018 1209   UROBILINOGEN 0.2 06/30/2013 2213   NITRITE NEGATIVE 08/23/2018 1209   LEUKOCYTESUR SMALL (A) 08/23/2018 1209   LEUKOCYTESUR Negative 07/23/2013 0741   Sepsis Labs Invalid input(s): PROCALCITONIN,  WBC,  LACTICIDVEN   Time coordinating discharge: 35 minutes  SIGNED:  Mercy Riding, MD  [facility] 09/12/2018, 12:54 PM  If 7PM-7AM, please contact night-coverage [email or web address] Password [email or web address]

## 2018-09-12 NOTE — Progress Notes (Signed)
Physical Therapy Treatment Patient Details Name: Jamie Arias MRN: [id number] DOB: 09-May-1955 Today's Date: 09/12/2018    History of Present Illness Jamie Arias is a 63 y.o. female with medical history significant of ESRD on HD MWF, anemia, chronic diastolic congestive heart failure, CAD status post PCI, type 2 diabetes, hypertension, hyperlipidemia, hypothyroidism, and conditions listed below presenting as a transfer from [facility].  COVID-19 positive.  Patient transferred to [facility] for management of COVID-19 and hemodialysis.     PT Comments    Pt continues with slow progress. Feels "sick" sitting EOB. Incontinent of stool so returned to supine to be cleaned up.    Follow Up Recommendations  SNF     Equipment Recommendations  Other (comment)(To be determined at next venue)    Recommendations for Other Services       Precautions / Restrictions Precautions Precautions: Fall Restrictions Weight Bearing Restrictions: No    Mobility  Bed Mobility Overal bed mobility: Needs Assistance Bed Mobility: Sit to Supine;Rolling;Sidelying to Sit Rolling: Mod assist Sidelying to sit: Mod assist;HOB elevated   Sit to supine: Mod assist   General bed mobility comments: Assist to bring hips and shoulders over to roll. Assist to bring legs off of bed and elevate trunk in to sitting. Assist to lower trunk and bring legs up into bed returning to supine.  Transfers                 General transfer comment: Unable to attempt due to pt incontinent of stool.  Ambulation/Gait                 Stairs             Wheelchair Mobility    Modified Rankin (Stroke Patients Only)       Balance Overall balance assessment: Needs assistance Sitting-balance support: Feet supported;Bilateral upper extremity supported Sitting balance-Leahy Scale: Poor Sitting balance - Comments: Sat EOB x 8 minutes with min to min guard assist. Postural control: Posterior lean                                   Cognition Arousal/Alertness: Awake/alert Behavior During Therapy: Flat affect Overall Cognitive Status: Impaired/Different from baseline Area of Impairment: Attention;Following commands;Awareness;Problem solving                   Current Attention Level: Sustained Memory: Decreased short-term memory Following Commands: Follows one step commands inconsistently;Follows one step commands with increased time   Awareness: Intellectual Problem Solving: Slow processing;Decreased initiation;Requires verbal cues;Requires tactile cues        Exercises      General Comments        Pertinent Vitals/Pain Pain Assessment: Faces Faces Pain Scale: No hurt    Home Living                      Prior Function            PT Goals (current goals can now be found in the care plan section) Progress towards PT goals: Progressing toward goals    Frequency    Min 2X/week      PT Plan Current plan remains appropriate;Frequency needs to be updated    Co-evaluation              AM-PAC PT "6 Clicks" Mobility   Outcome Measure  Help needed turning from your back to  your side while in a flat bed without using bedrails?: A Lot Help needed moving from lying on your back to sitting on the side of a flat bed without using bedrails?: A Lot Help needed moving to and from a bed to a chair (including a wheelchair)?: Total Help needed standing up from a chair using your arms (e.g., wheelchair or bedside chair)?: Total Help needed to walk in hospital room?: Total Help needed climbing 3-5 steps with a railing? : Total 6 Click Score: 8    End of Session   Activity Tolerance: Other (comment);Patient limited by fatigue(Pt incontinent of stool) Patient left: in bed;with call bell/phone within reach;with bed alarm set Nurse Communication: Mobility status PT Visit Diagnosis: Unsteadiness on feet (R26.81);Other abnormalities of gait and  mobility (R26.89);History of falling (Z91.81);Other symptoms and signs involving the nervous system (R29.898)     Time: 8786-7672 PT Time Calculation (min) (ACUTE ONLY): 20 min  Charges:  $Therapeutic Activity: 8-22 mins                     [place] Pager [phone] Office [place] 09/12/2018, 1:03 PM

## 2018-09-12 NOTE — Progress Notes (Signed)
[facility] Progress Note   Assessment/ Plan:   # ESRD MWF:  -Patient had dialysis on Friday and 7/4  because of hyperkalemia. S/P lokelma 2 doses. HD on schedule 7/6, K much improved. - Has been accepted to SNF in [place] and therefore will need different HD unit in [place] shift at [place]. - tolerated full HD session in chair 7/8.  Next planned HD Saturday 7/11 if still here- has already gotten M and W treatment here.    # Anemia of chronic kidney disease: no ESA indicated  # Secondary hyperparathyroidism: Monitor phosphorus level. Continue PhosLo.   # Hypotension/volume: On midodrine for hypotension. Monitor BP  Weights are overall trending down for this admission  #COVID-19 positive: Per primary team, s/p steroids  #Altered mental status: ResolvedMinimizing sedatives.    # Depression: per primary  # Afib: new dx, on amiodarone per cardiology.  No AC for now.    # Hypoglycemia: poor PO intake--> staff trying to encourage her to eat  # Dispo: pending  Subjective:    CGBs lower, hasn't really been eating much and is feeling depressed.      Objective:   BP 132/70   Pulse 90   Temp 98.3 F (36.8 C) (Oral)   Resp 18   Ht 5\' 4"  (1.626 m)   Wt 89.2 kg   SpO2 98%   BMI 33.76 kg/m   Physical Exam: Exam deferred to COVID + status, relying on exam of other providers and extensive chart review.  Labs: BMET Recent Labs  Lab 09/06/18 0514 09/07/18 0854 09/07/18 1626 09/08/18 0500 09/09/18 0629 09/10/18 0822 09/11/18 0458 09/12/18 0533  NA 137 137  --  142 140 135 137 135  K 5.5* 5.9* 6.3* 4.0 3.6 4.1 4.8 4.5  CL 95* 98  --  95* 95* 93* 96* 95*  CO2 27 19*  --  27 26 27 26 25   GLUCOSE 151* 75  --  54* 45* 103* 51* 45*  BUN 62* 53*  --  74* 61* 76* 36* 49*  CREATININE 8.04* 7.27*  --  9.87* 8.61* 10.36* 6.51* 8.70*  CALCIUM 9.5 9.3  --  9.3 9.2 9.6 8.8* 8.7*  PHOS 4.9* 3.2  --  5.1* 4.3 3.1 2.3* 2.5   CBC Recent Labs  Lab  09/06/18 1145 09/08/18 1357 09/10/18 0822 09/12/18 0533  WBC 8.7 13.3* 8.0 6.5  HGB 15.2* 12.0 11.4* 11.9*  HCT 44.4 36.3 34.4* 37.0  MCV 97.2 100.8* 100.9* 102.8*  PLT 157 161 125* 119*    @IMGRELPRIORS @ Medications:    . amiodarone  200 mg Oral BID  . aspirin EC  81 mg Oral Daily  . atorvastatin  80 mg Oral q1800  . calcium acetate  2,001 mg Oral TID WC  . Chlorhexidine Gluconate Cloth  6 each Topical Q0600  . feeding supplement  1 Container Oral TID BM  . feeding supplement (PRO-STAT SUGAR FREE 64)  30 mL Oral TID BM  . heparin  20 Units/kg Dialysis Once in dialysis  . heparin  5,000 Units Subcutaneous Q8H  . hydrocortisone  20 mg Oral BID  . insulin aspart  0-5 Units Subcutaneous QHS  . insulin aspart  0-9 Units Subcutaneous TID WC  . levothyroxine  50 mcg Oral Q0600  . mouth rinse  15 mL Mouth Rinse BID  . midodrine  10 mg Oral TID WC  . mirtazapine  15 mg Oral QHS  . multivitamin  1 tablet Oral  QHS  . pantoprazole  40 mg Oral Daily  . polyethylene glycol  17 g Oral Daily  . venlafaxine  75 mg Oral BID WC  . vitamin C  500 mg Oral Daily  . zinc sulfate  220 mg Oral Daily     Madelon Lips MD [facility] pgr [phone] 09/12/2018, 9:52 AM

## 2018-09-12 NOTE — Plan of Care (Signed)
  Problem: Nutrition: Goal: Adequate nutrition will be maintained Outcome: Not Progressing   Problem: Education: Goal: Knowledge of General Education information will improve Description: Including pain rating scale, medication(s)/side effects and non-pharmacologic comfort measures Outcome: Adequate for Discharge   Problem: Health Behavior/Discharge Planning: Goal: Ability to manage health-related needs will improve Outcome: Adequate for Discharge   Problem: Clinical Measurements: Goal: Respiratory complications will improve Outcome: Adequate for Discharge   Problem: Activity: Goal: Risk for activity intolerance will decrease Outcome: Adequate for Discharge   Problem: Pain Managment: Goal: General experience of comfort will improve Outcome: Adequate for Discharge

## 2018-09-12 NOTE — Progress Notes (Signed)
Renal Navigator notes cancelled discharge order for patient and confirmed with CM/D. Lovena Le. Renal Navigator notified Nephrologist/Dr. Hollie Salk of cancelled discharged and ensured that patient would be scheduled for inpatient HD on Saturday 09/13/18, as next regularly scheduled day.  Given that patient is discharging to SNF in [place] and is new start to this OP HD clinic/[facility], it is not feasible for her to discharge prior to OP HD treatment to receive HD at clinic tomorrow, should she be medically stable for discharge tomorrow. OP HD clinic aware that patient will not be starting in clinic tomorrow and now we will tentatively plan for Tuesday, 09/16/18 as first OP HD treatment at [place] shift clinic.  Alphonzo Cruise, [place] Renal Navigator [phone]

## 2018-09-12 NOTE — Progress Notes (Signed)
Patient's CBG was 49.  Administered dextrose 39mL.  Also fed patient three Hendrik Donath crackers with peanut butter.   Explained to patient the importance of eating.  Patient initially seems withdrawn, but if you continue to engage with patient, she will become more responsive.  Patient is lonely and misses her family.  This RN told patient her family is waiting for her to return home, but she MUST eat in order for that to occur.

## 2018-09-12 NOTE — Progress Notes (Signed)
CSW called and spoke with the facility. The facility will be able to take the patient once she is medically stable. CSW has paged the MD to see if the patient is ready for discharge.   CSW will continue to follow and assist with discharge planning.   Domenic Schwab, MSW, [place]

## 2018-09-12 NOTE — Progress Notes (Signed)
Renal Navigator notes discharge summary and order in the computer and contacted CSW/C. Pinion to see if transport to SNF would be today in order for Renal Navigator to keep OP HD clinic/[facility] updated on patient's start date (currently planned for tomorrow 09/13/18). CSW states she will keep Renal Navigator updated on discharge to SNF. Renal Navigator is appreciative.  Alphonzo Cruise, [place] Renal Navigator [phone]

## 2018-09-12 NOTE — Care Management Important Message (Signed)
Important Message  Patient Details  Name: Jamie Arias MRN: [id number] Date of Birth: 12-Jul-1955   Medicare Important Message Given:  Yes     Zenon Mayo, RN 09/12/2018, 3:02 PM

## 2018-09-13 DIAGNOSIS — R531 Weakness: Secondary | ICD-10-CM

## 2018-09-13 DIAGNOSIS — Z794 Long term (current) use of insulin: Secondary | ICD-10-CM

## 2018-09-13 DIAGNOSIS — R630 Anorexia: Secondary | ICD-10-CM

## 2018-09-13 DIAGNOSIS — E11649 Type 2 diabetes mellitus with hypoglycemia without coma: Secondary | ICD-10-CM

## 2018-09-13 DIAGNOSIS — F4321 Adjustment disorder with depressed mood: Secondary | ICD-10-CM

## 2018-09-13 LAB — CBC
HCT: 35.6 % — ABNORMAL LOW (ref 36.0–46.0)
Hemoglobin: 11.5 g/dL — ABNORMAL LOW (ref 12.0–15.0)
MCH: 33.2 pg (ref 26.0–34.0)
MCHC: 32.3 g/dL (ref 30.0–36.0)
MCV: 102.9 fL — ABNORMAL HIGH (ref 80.0–100.0)
Platelets: 130 10*3/uL — ABNORMAL LOW (ref 150–400)
RBC: 3.46 MIL/uL — ABNORMAL LOW (ref 3.87–5.11)
RDW: 19 % — ABNORMAL HIGH (ref 11.5–15.5)
WBC: 5.1 10*3/uL (ref 4.0–10.5)
nRBC: 0 % (ref 0.0–0.2)

## 2018-09-13 LAB — RENAL FUNCTION PANEL
Albumin: 2.9 g/dL — ABNORMAL LOW (ref 3.5–5.0)
Anion gap: 15 (ref 5–15)
BUN: 64 mg/dL — ABNORMAL HIGH (ref 8–23)
CO2: 26 mmol/L (ref 22–32)
Calcium: 9.6 mg/dL (ref 8.9–10.3)
Chloride: 92 mmol/L — ABNORMAL LOW (ref 98–111)
Creatinine, Ser: 10.47 mg/dL — ABNORMAL HIGH (ref 0.44–1.00)
GFR calc Af Amer: 4 mL/min — ABNORMAL LOW (ref >60)
GFR calc non Af Amer: 3 mL/min — ABNORMAL LOW (ref >60)
Glucose, Bld: 97 mg/dL (ref 70–99)
Phosphorus: 3.2 mg/dL (ref 2.5–4.6)
Potassium: 5.3 mmol/L — ABNORMAL HIGH (ref 3.5–5.1)
Sodium: 133 mmol/L — ABNORMAL LOW (ref 135–145)

## 2018-09-13 LAB — GLUCOSE, CAPILLARY
Glucose-Capillary: 113 mg/dL — ABNORMAL HIGH (ref 70–99)
Glucose-Capillary: 110 mg/dL — ABNORMAL HIGH (ref 70–99)
Glucose-Capillary: 91 mg/dL (ref 70–99)
Glucose-Capillary: 113 mg/dL — ABNORMAL HIGH (ref 70–99)
Glucose-Capillary: 106 mg/dL — ABNORMAL HIGH (ref 70–99)
Glucose-Capillary: 95 mg/dL (ref 70–99)

## 2018-09-13 NOTE — Progress Notes (Signed)
[facility] Progress Note   Assessment/ Plan:   # ESRD MWF:  -Patient had dialysis on Friday and 7/4  because of hyperkalemia. S/P lokelma 2 doses. HD on schedule 7/6, K much improved. - Has been accepted to SNF in [place] and therefore will need different HD unit in [place] shift at [place]. - tolerated full HD session in chair 7/8.  Next planned HD today 7/11   # Anemia of chronic kidney disease: no ESA indicated  # Secondary hyperparathyroidism: Monitor phosphorus level. Continue PhosLo.   # Hypotension/volume: On midodrine for hypotension. Monitor BP  Weights are overall trending down for this admission  #COVID-19 positive: Per primary team, s/p steroids  #Altered mental status: ResolvedMinimizing sedatives.    # Depression: per primary  # Afib: new dx, on amiodarone per cardiology.  No AC for now.    # Hypoglycemia: poor PO intake--> staff trying to encourage her to eat, CBGs better  # Dispo: pending  Subjective:    For HD today.  Looks like CBGs a little better overnight.      Objective:   BP (!) 138/49 (BP Location: Left Arm)   Pulse 93   Temp 97.6 F (36.4 C) (Oral)   Resp 16   Ht 5\' 4"  (1.626 m)   Wt 89.2 kg   SpO2 100%   BMI 33.76 kg/m   Physical Exam: Exam deferred to COVID + status, relying on exam of other providers and extensive chart review.  Labs: BMET Recent Labs  Lab 09/07/18 0854 09/07/18 1626 09/08/18 0500 09/09/18 0629 09/10/18 0822 09/11/18 0458 09/12/18 0533 09/13/18 0744  NA 137  --  142 140 135 137 135 133*  K 5.9* 6.3* 4.0 3.6 4.1 4.8 4.5 5.3*  CL 98  --  95* 95* 93* 96* 95* 92*  CO2 19*  --  27 26 27 26 25 26   GLUCOSE 75  --  54* 45* 103* 51* 45* 97  BUN 53*  --  74* 61* 76* 36* 49* 64*  CREATININE 7.27*  --  9.87* 8.61* 10.36* 6.51* 8.70* 10.47*  CALCIUM 9.3  --  9.3 9.2 9.6 8.8* 8.7* 9.6  PHOS 3.2  --  5.1* 4.3 3.1 2.3* 2.5 3.2   CBC Recent Labs  Lab 09/06/18 1145 09/08/18 1357  09/10/18 0822 09/12/18 0533  WBC 8.7 13.3* 8.0 6.5  HGB 15.2* 12.0 11.4* 11.9*  HCT 44.4 36.3 34.4* 37.0  MCV 97.2 100.8* 100.9* 102.8*  PLT 157 161 125* 119*    @IMGRELPRIORS @ Medications:    . amiodarone  200 mg Oral BID  . aspirin EC  81 mg Oral Daily  . atorvastatin  80 mg Oral q1800  . calcium acetate  2,001 mg Oral TID WC  . Chlorhexidine Gluconate Cloth  6 each Topical Q0600  . feeding supplement  1 Container Oral TID BM  . feeding supplement (PRO-STAT SUGAR FREE 64)  30 mL Oral TID BM  . heparin  20 Units/kg Dialysis Once in dialysis  . heparin  5,000 Units Subcutaneous Q8H  . hydrocortisone  20 mg Oral BID  . insulin aspart  0-5 Units Subcutaneous QHS  . insulin aspart  0-9 Units Subcutaneous TID WC  . levothyroxine  50 mcg Oral Q0600  . mouth rinse  15 mL Mouth Rinse BID  . midodrine  10 mg Oral TID WC  . mirtazapine  30 mg Oral QHS  . multivitamin  1 tablet Oral QHS  . pantoprazole  40 mg Oral Daily  . polyethylene glycol  17 g Oral Daily  . venlafaxine  75 mg Oral BID WC  . vitamin C  500 mg Oral Daily  . zinc sulfate  220 mg Oral Daily     Madelon Lips MD [facility] pgr [phone] 09/13/2018, 9:19 AM

## 2018-09-13 NOTE — Progress Notes (Signed)
PROGRESS NOTE  Jamie Arias NIO:[id number] DOB: 1955/07/04   PCP: [name], MD  Patient is from: Home  DOA: 08/23/2018 LOS: 21  Brief Narrative / Interim history: 63 y.o.femalewith medical history significant ofESRD on HDMWF,anemia, chronic diastolic congestive heart failure, CAD status post PCI, type 2 diabetes, hypertension, hyperlipidemia, hypothyroidism, and conditions listed belowpresenting as a transfer from [facility]. COVID-19positive. Patient transferred to [facility] for management of COVID-19 and hemodialysis.  [place] SNF closer to heroutpatient dialysis centerbut stayed foronemore HD session, then hypoglycemia due to poor p.o. intake in the setting of acute stress disorder from current critical illness.  See individual problem list below for more.  Subjective: No major events overnight of this morning except for hypoglycemia to 58 about 11 PM that has resolved with dextrose push.  Reports feeling weak.  Denies chest pain, shortness of breath, GI or GU symptoms.  Objective: Vitals:   09/13/18 1303 09/13/18 1308 09/13/18 1315 09/13/18 1330  BP: (!) 146/61 (!) 143/52 (!) 119/47 (!) 82/27  Pulse: 94 94 99 (!) 107  Resp:  18    Temp:      TempSrc:      SpO2:      Weight:      Height:       No intake or output data in the 24 hours ending 09/13/18 1344 Filed Weights   09/08/18 0720 09/08/18 1039 09/13/18 1255  Weight: 89.2 kg 89.2 kg 91.7 kg    Examination:  GENERAL: No acute distress.  Appears weak HEENT: MMM.  Vision and hearing grossly intact.  NECK: Supple.  No apparent JVD.  LUNGS:  No IWOB. Good air movement bilaterally. HEART:  RRR. Heart sounds normal.  ABD: Bowel sounds present. Soft. Non tender.  MSK/EXT:  Moves extremities. No apparent deformity or edema.  SKIN: no apparent skin lesion or wound NEURO: Awake, alert and oriented appropriately.  No gross deficit except for generalized  weakness.  PSYCH: Flat affect  I have personally reviewed the following labs and images:  Radiology Studies: No results found.  Microbiology: Recent Results (from the past 240 hour(s))  SARS Coronavirus 2 (CEPHEID- Performed in [place] hospital lab), Hosp Order     Status: Abnormal   Collection Time: 09/08/18  4:34 PM   Specimen: Nasopharyngeal Swab  Result Value Ref Range Status   SARS Coronavirus 2 POSITIVE (A) NEGATIVE Final    Comment: RESULT CALLED TO, READ BACK BY AND VERIFIED WITH: Guerry Bruin RN 09/08/18 1738 JDW (NOTE) If result is NEGATIVE SARS-CoV-2 target nucleic acids are NOT DETECTED. The SARS-CoV-2 RNA is generally detectable in upper and lower  respiratory specimens during the acute phase of infection. The lowest  concentration of SARS-CoV-2 viral copies this assay can detect is 250  copies / mL. A negative result does not preclude SARS-CoV-2 infection  and should not be used as the sole basis for treatment or other  patient management decisions.  A negative result may occur with  improper specimen collection / handling, submission of specimen other  than nasopharyngeal swab, presence of viral mutation(s) within the  areas targeted by this assay, and inadequate number of viral copies  (<250 copies / mL). A negative result must be combined with clinical  observations, patient history, and epidemiological information. If result is POSITIVE SARS-CoV-2 target nucleic acids are DETECTED. The S ARS-CoV-2 RNA is generally detectable in upper and lower  respiratory specimens during the acute phase of infection.  Positive  results are indicative of active infection with SARS-CoV-2.  Clinical  correlation with patient history and other diagnostic information is  necessary to determine patient infection status.  Positive results do  not rule out bacterial infection or co-infection with other viruses. If result is PRESUMPTIVE POSTIVE SARS-CoV-2 nucleic acids MAY BE  PRESENT.   A presumptive positive result was obtained on the submitted specimen  and confirmed on repeat testing.  While 2019 novel coronavirus  (SARS-CoV-2) nucleic acids may be present in the submitted sample  additional confirmatory testing may be necessary for epidemiological  and / or clinical management purposes  to differentiate between  SARS-CoV-2 and other Sarbecovirus currently known to infect humans.  If clinically indicated additional testing with an alternate test  methodology [phone]) is adv ised. The SARS-CoV-2 RNA is generally  detectable in upper and lower respiratory specimens during the acute  phase of infection. The expected result is Negative. Fact Sheet for Patients:  StrictlyIdeas.no Fact Sheet for Healthcare Providers: BankingDealers.co.za This test is not yet approved or cleared by the [place] FDA and has been authorized for detection and/or diagnosis of SARS-CoV-2 by FDA under an Emergency Use Authorization (EUA).  This EUA will remain in effect (meaning this test can be used) for the duration of the COVID-19 declaration under Section 564(b)(1) of the Act, 21 U.S.C. section 360bbb-3(b)(1), unless the authorization is terminated or revoked sooner. Performed at [facility], [place] [address] [place], [name] [address]     Sepsis Labs: Invalid input(s): PROCALCITONIN, LACTICIDVEN  Urine analysis:    Component Value Date/Time   COLORURINE YELLOW (A) 08/23/2018 1209   APPEARANCEUR CLOUDY (A) 08/23/2018 1209   APPEARANCEUR Hazy 07/23/2013 0741   LABSPEC 1.016 08/23/2018 1209   LABSPEC 1.016 07/23/2013 0741   PHURINE 5.0 08/23/2018 1209   GLUCOSEU NEGATIVE 08/23/2018 1209   GLUCOSEU Negative 07/23/2013 0741   HGBUR SMALL (A) 08/23/2018 1209   BILIRUBINUR NEGATIVE 08/23/2018 1209   BILIRUBINUR Negative 07/23/2013 0741   KETONESUR NEGATIVE 08/23/2018 1209   PROTEINUR 100 (A) 08/23/2018 1209    UROBILINOGEN 0.2 06/30/2013 2213   NITRITE NEGATIVE 08/23/2018 1209   LEUKOCYTESUR SMALL (A) 08/23/2018 1209   LEUKOCYTESUR Negative 07/23/2013 0741    Anemia Panel: No results for input(s): VITAMINB12, FOLATE, FERRITIN, TIBC, IRON, RETICCTPCT in the last 72 hours.  Thyroid Function Tests: Recent Labs    09/11/18 1014  TSH 3.665  FREET4 0.94    Lipid Profile: No results for input(s): CHOL, HDL, LDLCALC, TRIG, CHOLHDL, LDLDIRECT in the last 72 hours.  CBG: Recent Labs  Lab 09/12/18 2203 09/13/18 0109 09/13/18 0610 09/13/18 0729 09/13/18 1138  GLUCAP 109* 113* 110* 91 113*    HbA1C: No results for input(s): HGBA1C in the last 72 hours.  BNP (last 3 results): No results for input(s): PROBNP in the last 8760 hours.  Cardiac Enzymes: No results for input(s): CKTOTAL, CKMB, CKMBINDEX, TROPONINI in the last 168 hours.  Coagulation Profile: No results for input(s): INR, PROTIME in the last 168 hours.  Liver Function Tests: Recent Labs  Lab 09/09/18 0629 09/10/18 0822 09/11/18 0458 09/12/18 0533 09/13/18 0744  ALBUMIN 3.3* 2.9* 2.9* 2.9* 2.9*   No results for input(s): LIPASE, AMYLASE in the last 168 hours. Recent Labs  Lab 09/11/18 1014  AMMONIA 11    Basic Metabolic Panel: Recent Labs  Lab 09/09/18 0629 09/10/18 0822 09/11/18 0458 09/12/18 0533 09/13/18 0744  NA 140 135 137 135 133*  K 3.6 4.1 4.8 4.5 5.3*  CL 95* 93* 96* 95* 92*  CO2 26 27 26 25 26   GLUCOSE 45* 103* 51* 45* 97  BUN 61* 76* 36* 49* 64*  CREATININE 8.61* 10.36* 6.51* 8.70* 10.47*  CALCIUM 9.2 9.6 8.8* 8.7* 9.6  PHOS 4.3 3.1 2.3* 2.5 3.2   GFR: Estimated Creatinine Clearance: 6 mL/min (A) (by C-G formula based on SCr of 10.47 mg/dL (H)).  CBC: Recent Labs  Lab 09/08/18 1357 09/10/18 0822 09/12/18 0533 09/13/18 1304  WBC 13.3* 8.0 6.5 5.1  HGB 12.0 11.4* 11.9* 11.5*  HCT 36.3 34.4* 37.0 35.6*  MCV 100.8* 100.9* 102.8* 102.9*  PLT 161 125* 119* 130*    Procedures:   None  Microbiology summarized: [id number] positive multiple times. MRSA PCR negative.  Assessment & Plan: ESRD/bone mineral disorder -HD today -CLIPP'd tooutpatient HD center closer to SNF in [place] -She will continue outpatient dialysis at [facility] (COVID shift)  Acute respiratory failure due to COVID-19 related pneumonia -On room air -Continue Solu-Cortef urea.  Discharged on steroid taper  New A. TOI:ZTIWPY controlled with amiodarone.No anticoagulation given elevated risk of bleeding from COVID-19 per cardiology. Appreciate cardiology's help. -Per card note on 08/29/2018:amiodarone 400mg BID x7d, then 200 mg BID x2wks,then 200 mg/day  CAD status post CABG: Stable -Continue home cardiac meds, statin and aspirin  Diastolic CHF: Stable. -Fluid management with HD -Lasix on non-dialysis days.  -Echocardiogram in the future once she recovers from [place].  Hypotension:  Resolved. -Continue Midodrine -Steroid as above for potential adrenal insufficiency  Generalized weakness/deconditioning: Likely due to acute illness.  Steroid myopathy? -Ongoing PT/OT at SNF  Hypothyroidism -Continue home Synthroid  DM-2 with hypoglycemia: hypoglycemia is due to poor p.o. intake in the setting of acute critical illness and acute distress disorder.  Made medication changes for acute critical illness as below.  Also resumed steroid. -Steroid as above -Liberated diet to regular -Continue monitoring  Depression/Acute Stress Disorder due to critical illness -Changed Prozac to Effexor -Changed Trazodone to Remeron given poor appetite -Outpatient psych follow-up   DVT prophylaxis: Subcu heparin Code Status: Full code Family Communication: Patient and/or RN. Available if any question.  Disposition Plan: Remains inpatient due to hypoglycemia in the setting of poor p.o. intake Consultants: Nephrology   Antimicrobials: Anti-infectives (From admission, onward)   Start      Dose/Rate Route Frequency Ordered Stop   08/25/18 1000  cefTRIAXone (ROCEPHIN) 1 g in sodium chloride 0.9 % 100 mL IVPB     1 g 200 mL/hr over 30 Minutes Intravenous Every 24 hours 08/24/18 0022 08/27/18 1534   08/24/18 0500  azithromycin (ZITHROMAX) 500 mg in sodium chloride 0.9 % 250 mL IVPB  Status:  Discontinued     500 mg 250 mL/hr over 60 Minutes Intravenous Every 24 hours 08/24/18 0453 08/24/18 0454      Sch Meds:  Scheduled Meds: . amiodarone  200 mg Oral BID  . aspirin EC  81 mg Oral Daily  . atorvastatin  80 mg Oral q1800  . calcium acetate  2,001 mg Oral TID WC  . Chlorhexidine Gluconate Cloth  6 each Topical Q0600  . feeding supplement  1 Container Oral TID BM  . feeding supplement (PRO-STAT SUGAR FREE 64)  30 mL Oral TID BM  . heparin  20 Units/kg Dialysis Once in dialysis  . heparin  5,000 Units Subcutaneous Q8H  . hydrocortisone  20 mg Oral BID  . insulin aspart  0-5 Units Subcutaneous QHS  . insulin aspart  0-9 Units Subcutaneous TID  WC  . levothyroxine  50 mcg Oral Q0600  . mouth rinse  15 mL Mouth Rinse BID  . midodrine  10 mg Oral TID WC  . mirtazapine  30 mg Oral QHS  . multivitamin  1 tablet Oral QHS  . pantoprazole  40 mg Oral Daily  . polyethylene glycol  17 g Oral Daily  . venlafaxine  75 mg Oral BID WC  . vitamin C  500 mg Oral Daily  . zinc sulfate  220 mg Oral Daily   Continuous Infusions: . sodium chloride    . sodium chloride     PRN Meds:.sodium chloride, sodium chloride, acetaminophen, alteplase, heparin, lidocaine (PF), lidocaine-prilocaine, ondansetron (ZOFRAN) IV, oxyCODONE, pentafluoroprop-tetrafluoroeth   Jamie Arias  If 7PM-7AM, please contact night-coverage [email or web address] Password [facility] 09/13/2018, 1:44 PM

## 2018-09-13 NOTE — Plan of Care (Signed)
  Problem: Education: Goal: Knowledge of General Education information will improve Description: Including pain rating scale, medication(s)/side effects and non-pharmacologic comfort measures Outcome: Progressing   Problem: Health Behavior/Discharge Planning: Goal: Ability to manage health-related needs will improve Outcome: Progressing   Problem: Clinical Measurements: Goal: Ability to maintain clinical measurements within normal limits will improve Outcome: Progressing Goal: Will remain free from infection Outcome: Progressing Goal: Diagnostic test results will improve Outcome: Progressing Goal: Respiratory complications will improve Outcome: Progressing Goal: Cardiovascular complication will be avoided Outcome: Progressing   Problem: Coping: Goal: Level of anxiety will decrease Outcome: Progressing   Problem: Elimination: Goal: Will not experience complications related to bowel motility Outcome: Progressing Goal: Will not experience complications related to urinary retention Outcome: Progressing   Problem: Pain Managment: Goal: General experience of comfort will improve Outcome: Progressing   Problem: Safety: Goal: Ability to remain free from injury will improve Outcome: Progressing   Problem: Skin Integrity: Goal: Risk for impaired skin integrity will decrease Outcome: Progressing   

## 2018-09-14 LAB — GLUCOSE, CAPILLARY
Glucose-Capillary: 126 mg/dL — ABNORMAL HIGH (ref 70–99)
Glucose-Capillary: 148 mg/dL — ABNORMAL HIGH (ref 70–99)
Glucose-Capillary: 159 mg/dL — ABNORMAL HIGH (ref 70–99)
Glucose-Capillary: 139 mg/dL — ABNORMAL HIGH (ref 70–99)
Glucose-Capillary: 128 mg/dL — ABNORMAL HIGH (ref 70–99)
Glucose-Capillary: 85 mg/dL (ref 70–99)

## 2018-09-14 LAB — RENAL FUNCTION PANEL
Albumin: 2.7 g/dL — ABNORMAL LOW (ref 3.5–5.0)
Albumin: 3.2 g/dL — ABNORMAL LOW (ref 3.5–5.0)
Anion gap: 21 — ABNORMAL HIGH (ref 5–15)
Anion gap: 16 — ABNORMAL HIGH (ref 5–15)
BUN: 37 mg/dL — ABNORMAL HIGH (ref 8–23)
BUN: 41 mg/dL — ABNORMAL HIGH (ref 8–23)
CO2: 17 mmol/L — ABNORMAL LOW (ref 22–32)
CO2: 26 mmol/L (ref 22–32)
Calcium: 8.9 mg/dL (ref 8.9–10.3)
Calcium: 9.4 mg/dL (ref 8.9–10.3)
Chloride: 100 mmol/L (ref 98–111)
Chloride: 96 mmol/L — ABNORMAL LOW (ref 98–111)
Creatinine, Ser: 7 mg/dL — ABNORMAL HIGH (ref 0.44–1.00)
Creatinine, Ser: 7.64 mg/dL — ABNORMAL HIGH (ref 0.44–1.00)
GFR calc Af Amer: 7 mL/min — ABNORMAL LOW (ref >60)
GFR calc Af Amer: 6 mL/min — ABNORMAL LOW (ref >60)
GFR calc non Af Amer: 6 mL/min — ABNORMAL LOW (ref >60)
GFR calc non Af Amer: 5 mL/min — ABNORMAL LOW (ref >60)
Glucose, Bld: 135 mg/dL — ABNORMAL HIGH (ref 70–99)
Glucose, Bld: 134 mg/dL — ABNORMAL HIGH (ref 70–99)
Phosphorus: 3.1 mg/dL (ref 2.5–4.6)
Phosphorus: 3.3 mg/dL (ref 2.5–4.6)
Potassium: 5.6 mmol/L — ABNORMAL HIGH (ref 3.5–5.1)
Potassium: 5.3 mmol/L — ABNORMAL HIGH (ref 3.5–5.1)
Sodium: 138 mmol/L (ref 135–145)
Sodium: 138 mmol/L (ref 135–145)

## 2018-09-14 LAB — MAGNESIUM: Magnesium: 2.4 mg/dL (ref 1.7–2.4)

## 2018-09-14 LAB — SARS CORONAVIRUS 2 BY RT PCR (HOSPITAL ORDER, PERFORMED IN ~~LOC~~ HOSPITAL LAB): SARS Coronavirus 2: POSITIVE — AB

## 2018-09-14 MED ORDER — HYDROCORTISONE 5 MG PO TABS
15.0000 mg | ORAL_TABLET | Freq: Two times a day (BID) | ORAL | Status: DC
  Administered 2018-09-14 – 2018-09-15 (×2): 15 mg via ORAL
  Filled 2018-09-14 (×3): qty 1

## 2018-09-14 MED ORDER — SODIUM ZIRCONIUM CYCLOSILICATE 10 G PO PACK
10.0000 g | PACK | Freq: Two times a day (BID) | ORAL | Status: DC
  Administered 2018-09-14 – 2018-09-15 (×2): 10 g via ORAL
  Filled 2018-09-14 (×4): qty 1

## 2018-09-14 MED ORDER — METOPROLOL TARTRATE 5 MG/5ML IV SOLN
5.0000 mg | INTRAVENOUS | Status: DC | PRN
  Filled 2018-09-14: qty 5

## 2018-09-14 NOTE — Progress Notes (Signed)
Pt offered food and drink with each encounter. Only sips with meds taken, she refused anything more.

## 2018-09-14 NOTE — Progress Notes (Addendum)
[facility] Progress Note   Assessment/ Plan:   # ESRD MWF:  -Patient had dialysis on Friday and 7/4  because of hyperkalemia. S/P lokelma 2 doses. HD on schedule 7/6, K much improved. - Has been accepted to SNF in [place] and therefore will need different HD unit in [place] shift at [place]. - tolerated full HD session in chair 7/8.  HD completed 7/11, next planned HD 7/14 if K is reasonable today.  Repeating RFP (had been cancelled by lab, reordered).  If still elevated, will plan for HD tomorrow and dose of lokelma today.    # Anemia of chronic kidney disease: no ESA indicated  # Secondary hyperparathyroidism: Monitor phosphorus level. Continue PhosLo.   # Hypotension/volume: On midodrine for hypotension. Monitor BP  Weights are overall trending down for this admission.  On empiric hydrocortef for adrenal insufficiency (AM cortisol was 18.9).  #COVID-19 positive: Per primary team, s/p steroids  #Altered mental status: ResolvedMinimizing sedatives.    # Depression: per primary  # Afib: new dx, on amiodarone per cardiology.  No AC for now.    # Hypoglycemia: poor PO intake--> staff trying to encourage her to eat, CBGs better  # Dispo: pending  Subjective:    S/p HD yesterday with 1.4L off.  This am K is higher at 5.6, more acidemic than before HD yesterday (and used 2K bath yesterday as well).  Will repeat for accuracy.  Had some Afib last night.    Objective:   BP (!) 157/63 (BP Location: Left Arm)   Pulse 95   Temp 98.4 F (36.9 C) (Axillary)   Resp 16   Ht 5\' 4"  (1.626 m)   Wt 87.7 kg   SpO2 100%   BMI 33.19 kg/m   Physical Exam: Exam deferred to COVID + status, relying on exam of other providers and extensive chart review.  Labs: BMET Recent Labs  Lab 09/08/18 0500 09/09/18 0629 09/10/18 9622 09/11/18 0458 09/12/18 0533 09/13/18 0744 09/14/18 0656  NA 142 140 135 137 135 133* 138  K 4.0 3.6 4.1 4.8 4.5 5.3* 5.6*  CL  95* 95* 93* 96* 95* 92* 100  CO2 27 26 27 26 25 26  17*  GLUCOSE 54* 45* 103* 51* 45* 97 135*  BUN 74* 61* 76* 36* 49* 64* 37*  CREATININE 9.87* 8.61* 10.36* 6.51* 8.70* 10.47* 7.00*  CALCIUM 9.3 9.2 9.6 8.8* 8.7* 9.6 8.9  PHOS 5.1* 4.3 3.1 2.3* 2.5 3.2 3.1   CBC Recent Labs  Lab 09/08/18 1357 09/10/18 0822 09/12/18 0533 09/13/18 1304  WBC 13.3* 8.0 6.5 5.1  HGB 12.0 11.4* 11.9* 11.5*  HCT 36.3 34.4* 37.0 35.6*  MCV 100.8* 100.9* 102.8* 102.9*  PLT 161 125* 119* 130*    @IMGRELPRIORS @ Medications:    . amiodarone  200 mg Oral BID  . aspirin EC  81 mg Oral Daily  . atorvastatin  80 mg Oral q1800  . calcium acetate  2,001 mg Oral TID WC  . Chlorhexidine Gluconate Cloth  6 each Topical Q0600  . feeding supplement  1 Container Oral TID BM  . feeding supplement (PRO-STAT SUGAR FREE 64)  30 mL Oral TID BM  . heparin  20 Units/kg Dialysis Once in dialysis  . heparin  5,000 Units Subcutaneous Q8H  . hydrocortisone  20 mg Oral BID  . insulin aspart  0-5 Units Subcutaneous QHS  . insulin aspart  0-9 Units Subcutaneous TID WC  . levothyroxine  50 mcg Oral  Q0600  . mouth rinse  15 mL Mouth Rinse BID  . midodrine  10 mg Oral TID WC  . mirtazapine  30 mg Oral QHS  . multivitamin  1 tablet Oral QHS  . pantoprazole  40 mg Oral Daily  . polyethylene glycol  17 g Oral Daily  . venlafaxine  75 mg Oral BID WC  . vitamin C  500 mg Oral Daily  . zinc sulfate  220 mg Oral Daily     Madelon Lips MD [facility] pgr [phone] 09/14/2018, 9:42 AM

## 2018-09-14 NOTE — Progress Notes (Signed)
Received order from [facility] NP for 5 mg of Metoprolol for  HR sustaining at 120. Patient's HR now sustaining in the upper 90s. No acute distress noted. Will continue to monitor.

## 2018-09-14 NOTE — Progress Notes (Addendum)
Patient's HR jumped to 150s and sustained there for about 3 to 5 minutes. 02 sat was 88% to  90%  on R/A. Patient  Placed back  On 2L/[place]. HR now Fluctuating in between 98 to 128. Paged and notified [place] NP and awaiting a call back. Will continue to monitor.

## 2018-09-14 NOTE — Progress Notes (Signed)
PROGRESS NOTE  Jamie Arias HUT:[id number] DOB: 1956-01-01   PCP: [name], MD  Patient is from: Home  DOA: 08/23/2018 LOS: 34  Brief Narrative / Interim history: 63 y.o.femalewith medical history significant ofESRD on HDMWF,anemia, chronic diastolic congestive heart failure, CAD status post PCI, type 2 diabetes, hypertension, hyperlipidemia, hypothyroidism, and conditions listed belowpresenting as a transfer from [facility]. COVID-19positive. Patient transferred to [facility] for management of COVID-19 and hemodialysis.  [place] SNF closer to heroutpatient dialysis centerbut stayed foronemore HD session, then hypoglycemia due to poor p.o. intake in the setting of acute stress disorder from current critical illness.  See individual problem list below for more.  Subjective: No major events overnight of this morning. CBG within fair range.  She has not had further hypoglycemic event.  However, continues to have poor p.o. intake.  Denies pain or dyspnea.  She says she just feels weak.  Denies GI or GU symptoms.  Objective: Vitals:   09/13/18 2258 09/13/18 2345 09/14/18 0300 09/14/18 0800  BP:  (!) 116/49 (!) 122/53 (!) 157/63  Pulse:   97 95  Resp:  18 18 16   Temp:   98.7 F (37.1 C) 98.4 F (36.9 C)  TempSrc:   Oral Axillary  SpO2:  90% 100% 100%  Weight: 87.7 kg     Height:        Intake/Output Summary (Last 24 hours) at 09/14/2018 1503 Last data filed at 09/13/2018 1604 Gross per 24 hour  Intake -  Output 1458 ml  Net -1458 ml   Filed Weights   09/13/18 1255 09/13/18 1604 09/13/18 2258  Weight: 91.7 kg 90 kg 87.7 kg    Examination:  GENERAL: No acute distress.  Looks weak. HEENT: MMM.  Vision and hearing grossly intact.  NECK: Supple.  No apparent JVD. LUNGS:  No IWOB. Good air movement bilaterally. HEART:  RRR. Heart sounds normal.  ABD: Bowel sounds present. Soft. Non tender.  MSK/EXT:  Moves  all extremities. No apparent deformity. No edema bilaterally.  SKIN: no apparent skin lesion or wound NEURO: Awake, alert and oriented appropriately.  No gross deficit except for generalized weakness. PSYCH: Flat affect.  I have personally reviewed the following labs and images:  Radiology Studies: No results found.  Microbiology: Recent Results (from the past 240 hour(s))  SARS Coronavirus 2 (CEPHEID- Performed in [place] hospital lab), Hosp Order     Status: Abnormal   Collection Time: 09/08/18  4:34 PM   Specimen: Nasopharyngeal Swab  Result Value Ref Range Status   SARS Coronavirus 2 POSITIVE (A) NEGATIVE Final    Comment: RESULT CALLED TO, READ BACK BY AND VERIFIED WITH: Guerry Bruin RN 09/08/18 1738 JDW (NOTE) If result is NEGATIVE SARS-CoV-2 target nucleic acids are NOT DETECTED. The SARS-CoV-2 RNA is generally detectable in upper and lower  respiratory specimens during the acute phase of infection. The lowest  concentration of SARS-CoV-2 viral copies this assay can detect is 250  copies / mL. A negative result does not preclude SARS-CoV-2 infection  and should not be used as the sole basis for treatment or other  patient management decisions.  A negative result may occur with  improper specimen collection / handling, submission of specimen other  than nasopharyngeal swab, presence of viral mutation(s) within the  areas targeted by this assay, and inadequate number of viral copies  (<250 copies / mL). A negative result must be combined with clinical  observations, patient history, and  epidemiological information. If result is POSITIVE SARS-CoV-2 target nucleic acids are DETECTED. The S ARS-CoV-2 RNA is generally detectable in upper and lower  respiratory specimens during the acute phase of infection.  Positive  results are indicative of active infection with SARS-CoV-2.  Clinical  correlation with patient history and other diagnostic information is  necessary to  determine patient infection status.  Positive results do  not rule out bacterial infection or co-infection with other viruses. If result is PRESUMPTIVE POSTIVE SARS-CoV-2 nucleic acids MAY BE PRESENT.   A presumptive positive result was obtained on the submitted specimen  and confirmed on repeat testing.  While 2019 novel coronavirus  (SARS-CoV-2) nucleic acids may be present in the submitted sample  additional confirmatory testing may be necessary for epidemiological  and / or clinical management purposes  to differentiate between  SARS-CoV-2 and other Sarbecovirus currently known to infect humans.  If clinically indicated additional testing with an alternate test  methodology [phone]) is adv ised. The SARS-CoV-2 RNA is generally  detectable in upper and lower respiratory specimens during the acute  phase of infection. The expected result is Negative. Fact Sheet for Patients:  StrictlyIdeas.no Fact Sheet for Healthcare Providers: BankingDealers.co.za This test is not yet approved or cleared by the [place] FDA and has been authorized for detection and/or diagnosis of SARS-CoV-2 by FDA under an Emergency Use Authorization (EUA).  This EUA will remain in effect (meaning this test can be used) for the duration of the COVID-19 declaration under Section 564(b)(1) of the Act, 21 U.S.C. section 360bbb-3(b)(1), unless the authorization is terminated or revoked sooner. Performed at [facility], [place] [address] [place], [place] [address]     Sepsis Labs: Invalid input(s): PROCALCITONIN, LACTICIDVEN  Urine analysis:    Component Value Date/Time   COLORURINE YELLOW (A) 08/23/2018 1209   APPEARANCEUR CLOUDY (A) 08/23/2018 1209   APPEARANCEUR Hazy 07/23/2013 0741   LABSPEC 1.016 08/23/2018 1209   LABSPEC 1.016 07/23/2013 0741   PHURINE 5.0 08/23/2018 1209   GLUCOSEU NEGATIVE 08/23/2018 1209   GLUCOSEU Negative 07/23/2013 0741    HGBUR SMALL (A) 08/23/2018 1209   BILIRUBINUR NEGATIVE 08/23/2018 1209   BILIRUBINUR Negative 07/23/2013 0741   KETONESUR NEGATIVE 08/23/2018 1209   PROTEINUR 100 (A) 08/23/2018 1209   UROBILINOGEN 0.2 06/30/2013 2213   NITRITE NEGATIVE 08/23/2018 1209   LEUKOCYTESUR SMALL (A) 08/23/2018 1209   LEUKOCYTESUR Negative 07/23/2013 0741    Anemia Panel: No results for input(s): VITAMINB12, FOLATE, FERRITIN, TIBC, IRON, RETICCTPCT in the last 72 hours.  Thyroid Function Tests: No results for input(s): TSH, T4TOTAL, FREET4, T3FREE, THYROIDAB in the last 72 hours.  Lipid Profile: No results for input(s): CHOL, HDL, LDLCALC, TRIG, CHOLHDL, LDLDIRECT in the last 72 hours.  CBG: Recent Labs  Lab 09/13/18 2151 09/14/18 0017 09/14/18 0435 09/14/18 0831 09/14/18 1235  GLUCAP 95 126* 148* 159* 139*    HbA1C: No results for input(s): HGBA1C in the last 72 hours.  BNP (last 3 results): No results for input(s): PROBNP in the last 8760 hours.  Cardiac Enzymes: No results for input(s): CKTOTAL, CKMB, CKMBINDEX, TROPONINI in the last 168 hours.  Coagulation Profile: No results for input(s): INR, PROTIME in the last 168 hours.  Liver Function Tests: Recent Labs  Lab 09/11/18 0458 09/12/18 0533 09/13/18 0744 09/14/18 0656 09/14/18 1308  ALBUMIN 2.9* 2.9* 2.9* 2.7* 3.2*   No results for input(s): LIPASE, AMYLASE in the last 168 hours. Recent Labs  Lab 09/11/18 1014  AMMONIA 11  Basic Metabolic Panel: Recent Labs  Lab 09/11/18 0458 09/12/18 0533 09/13/18 0744 09/14/18 0656 09/14/18 1308  NA 137 135 133* 138 138  K 4.8 4.5 5.3* 5.6* 5.3*  CL 96* 95* 92* 100 96*  CO2 26 25 26  17* 26  GLUCOSE 51* 45* 97 135* 134*  BUN 36* 49* 64* 37* 41*  CREATININE 6.51* 8.70* 10.47* 7.00* 7.64*  CALCIUM 8.8* 8.7* 9.6 8.9 9.4  MG  --   --   --  2.4  --   PHOS 2.3* 2.5 3.2 3.1 3.3   GFR: Estimated Creatinine Clearance: 8 mL/min (A) (by C-G formula based on SCr of 7.64 mg/dL  (H)).  CBC: Recent Labs  Lab 09/08/18 1357 09/10/18 0822 09/12/18 0533 09/13/18 1304  WBC 13.3* 8.0 6.5 5.1  HGB 12.0 11.4* 11.9* 11.5*  HCT 36.3 34.4* 37.0 35.6*  MCV 100.8* 100.9* 102.8* 102.9*  PLT 161 125* 119* 130*    Procedures:  None  Microbiology summarized: [id number] positive multiple times. MRSA PCR negative.  Assessment & Plan: ESRD/bone mineral disorder/hyperkalemia -HD per nephrology, MWF here. -CLIPP'd tooutpatient HD center closer to SNF in [place] at [place] ([place] shift)  Acute respiratory failure due to COVID-19 related pneumonia -On room air for days. -Continue Solu-Cortef for possible adrenal insufficiency.  Discharge on steroid taper  New A. ZCH:YIFOYD controlled with amiodarone.No anticoagulation given elevated risk of bleeding from COVID-19 per cardiology. Appreciate cardiology's help. -Per card note on 08/29/2018:amiodarone 400mg BID x7d, then 200 mg BID x2wks,then 200 mg/day  CAD status post CABG: Stable -Continue home cardiac meds, statin and aspirin  Diastolic CHF: Stable. -Fluid management with HD -Lasix on non-dialysis days.  -Echocardiogram in the future once she recovers from [place].  Hypotension:  Resolved. -Continue Midodrine -Steroid as above for potential adrenal insufficiency  Generalized weakness/deconditioning: Likely due to acute illness.  Steroid myopathy? -Ongoing PT/OT at SNF  Hypothyroidism -Continue home Synthroid  DM-2 with hypoglycemia: hypoglycemia is due to poor p.o. intake in the setting of acute critical illness and acute distress disorder.  Made medication changes for acute critical illness as below.  Also resumed steroid and liberated diet -Steroid as above -Continue monitoring -Encourage feeding supplements  Depression/Acute Stress Disorder due to critical illness -Changed Prozac to Effexor -Changed Trazodone to Remeron given poor appetite -Outpatient psych follow-up   DVT  prophylaxis: Subcu heparin Code Status: Full code Family Communication: Patient and/or RN. Available if any question.  Disposition Plan: Anticipate discharge to SNF on 7/13 if no further hypoglycemic event.  Patient continues to have poor p.o. intake. Consultants: Nephrology   Antimicrobials: Anti-infectives (From admission, onward)   Start     Dose/Rate Route Frequency Ordered Stop   08/25/18 1000  cefTRIAXone (ROCEPHIN) 1 g in sodium chloride 0.9 % 100 mL IVPB     1 g 200 mL/hr over 30 Minutes Intravenous Every 24 hours 08/24/18 0022 08/27/18 1534   08/24/18 0500  azithromycin (ZITHROMAX) 500 mg in sodium chloride 0.9 % 250 mL IVPB  Status:  Discontinued     500 mg 250 mL/hr over 60 Minutes Intravenous Every 24 hours 08/24/18 0453 08/24/18 0454      Sch Meds:  Scheduled Meds: . amiodarone  200 mg Oral BID  . aspirin EC  81 mg Oral Daily  . atorvastatin  80 mg Oral q1800  . calcium acetate  2,001 mg Oral TID WC  . Chlorhexidine Gluconate Cloth  6 each Topical Q0600  . feeding supplement  1 Container Oral TID  BM  . feeding supplement (PRO-STAT SUGAR FREE 64)  30 mL Oral TID BM  . heparin  20 Units/kg Dialysis Once in dialysis  . heparin  5,000 Units Subcutaneous Q8H  . hydrocortisone  20 mg Oral BID  . insulin aspart  0-5 Units Subcutaneous QHS  . insulin aspart  0-9 Units Subcutaneous TID WC  . levothyroxine  50 mcg Oral Q0600  . mouth rinse  15 mL Mouth Rinse BID  . midodrine  10 mg Oral TID WC  . mirtazapine  30 mg Oral QHS  . multivitamin  1 tablet Oral QHS  . pantoprazole  40 mg Oral Daily  . polyethylene glycol  17 g Oral Daily  . sodium zirconium cyclosilicate  10 g Oral BID  . venlafaxine  75 mg Oral BID WC  . vitamin C  500 mg Oral Daily  . zinc sulfate  220 mg Oral Daily   Continuous Infusions: . sodium chloride    . sodium chloride     PRN Meds:.sodium chloride, sodium chloride, acetaminophen, alteplase, heparin, lidocaine (PF), lidocaine-prilocaine,  metoprolol tartrate, ondansetron (ZOFRAN) IV, oxyCODONE, pentafluoroprop-tetrafluoroeth    T. [place]  If 7PM-7AM, please contact night-coverage [email or web address] Password [facility] 09/14/2018, 3:03 PM

## 2018-09-15 DIAGNOSIS — I4891 Unspecified atrial fibrillation: Secondary | ICD-10-CM | POA: Diagnosis present

## 2018-09-15 DIAGNOSIS — E785 Hyperlipidemia, unspecified: Secondary | ICD-10-CM | POA: Diagnosis present

## 2018-09-15 DIAGNOSIS — E1122 Type 2 diabetes mellitus with diabetic chronic kidney disease: Secondary | ICD-10-CM | POA: Diagnosis not present

## 2018-09-15 DIAGNOSIS — Z7401 Bed confinement status: Secondary | ICD-10-CM | POA: Diagnosis not present

## 2018-09-15 DIAGNOSIS — Z79899 Other long term (current) drug therapy: Secondary | ICD-10-CM | POA: Diagnosis not present

## 2018-09-15 DIAGNOSIS — G9341 Metabolic encephalopathy: Secondary | ICD-10-CM | POA: Diagnosis present

## 2018-09-15 DIAGNOSIS — N186 End stage renal disease: Secondary | ICD-10-CM | POA: Diagnosis not present

## 2018-09-15 DIAGNOSIS — F329 Major depressive disorder, single episode, unspecified: Secondary | ICD-10-CM | POA: Diagnosis not present

## 2018-09-15 DIAGNOSIS — K219 Gastro-esophageal reflux disease without esophagitis: Secondary | ICD-10-CM | POA: Diagnosis present

## 2018-09-15 DIAGNOSIS — J9601 Acute respiratory failure with hypoxia: Secondary | ICD-10-CM | POA: Diagnosis present

## 2018-09-15 DIAGNOSIS — E119 Type 2 diabetes mellitus without complications: Secondary | ICD-10-CM | POA: Diagnosis not present

## 2018-09-15 DIAGNOSIS — Z951 Presence of aortocoronary bypass graft: Secondary | ICD-10-CM | POA: Diagnosis not present

## 2018-09-15 DIAGNOSIS — R918 Other nonspecific abnormal finding of lung field: Secondary | ICD-10-CM | POA: Diagnosis not present

## 2018-09-15 DIAGNOSIS — Z7982 Long term (current) use of aspirin: Secondary | ICD-10-CM | POA: Diagnosis not present

## 2018-09-15 DIAGNOSIS — I2510 Atherosclerotic heart disease of native coronary artery without angina pectoris: Secondary | ICD-10-CM | POA: Diagnosis present

## 2018-09-15 DIAGNOSIS — I959 Hypotension, unspecified: Secondary | ICD-10-CM | POA: Diagnosis not present

## 2018-09-15 DIAGNOSIS — D539 Nutritional anemia, unspecified: Secondary | ICD-10-CM | POA: Diagnosis present

## 2018-09-15 DIAGNOSIS — E875 Hyperkalemia: Secondary | ICD-10-CM | POA: Diagnosis present

## 2018-09-15 DIAGNOSIS — I10 Essential (primary) hypertension: Secondary | ICD-10-CM | POA: Diagnosis not present

## 2018-09-15 DIAGNOSIS — M2550 Pain in unspecified joint: Secondary | ICD-10-CM | POA: Diagnosis not present

## 2018-09-15 DIAGNOSIS — G4700 Insomnia, unspecified: Secondary | ICD-10-CM | POA: Diagnosis present

## 2018-09-15 DIAGNOSIS — I4892 Unspecified atrial flutter: Secondary | ICD-10-CM | POA: Diagnosis present

## 2018-09-15 DIAGNOSIS — E11649 Type 2 diabetes mellitus with hypoglycemia without coma: Secondary | ICD-10-CM | POA: Diagnosis not present

## 2018-09-15 DIAGNOSIS — Z794 Long term (current) use of insulin: Secondary | ICD-10-CM | POA: Diagnosis not present

## 2018-09-15 DIAGNOSIS — J1289 Other viral pneumonia: Secondary | ICD-10-CM | POA: Diagnosis present

## 2018-09-15 DIAGNOSIS — F39 Unspecified mood [affective] disorder: Secondary | ICD-10-CM | POA: Diagnosis present

## 2018-09-15 DIAGNOSIS — R0689 Other abnormalities of breathing: Secondary | ICD-10-CM | POA: Diagnosis not present

## 2018-09-15 DIAGNOSIS — I120 Hypertensive chronic kidney disease with stage 5 chronic kidney disease or end stage renal disease: Secondary | ICD-10-CM | POA: Diagnosis not present

## 2018-09-15 DIAGNOSIS — R251 Tremor, unspecified: Secondary | ICD-10-CM | POA: Diagnosis not present

## 2018-09-15 DIAGNOSIS — R404 Transient alteration of awareness: Secondary | ICD-10-CM | POA: Diagnosis not present

## 2018-09-15 DIAGNOSIS — I5030 Unspecified diastolic (congestive) heart failure: Secondary | ICD-10-CM | POA: Diagnosis not present

## 2018-09-15 DIAGNOSIS — J9621 Acute and chronic respiratory failure with hypoxia: Secondary | ICD-10-CM | POA: Diagnosis not present

## 2018-09-15 DIAGNOSIS — R410 Disorientation, unspecified: Secondary | ICD-10-CM | POA: Diagnosis not present

## 2018-09-15 DIAGNOSIS — E039 Hypothyroidism, unspecified: Secondary | ICD-10-CM | POA: Diagnosis present

## 2018-09-15 DIAGNOSIS — N2581 Secondary hyperparathyroidism of renal origin: Secondary | ICD-10-CM | POA: Diagnosis not present

## 2018-09-15 DIAGNOSIS — D631 Anemia in chronic kidney disease: Secondary | ICD-10-CM | POA: Diagnosis not present

## 2018-09-15 DIAGNOSIS — Z992 Dependence on renal dialysis: Secondary | ICD-10-CM | POA: Diagnosis not present

## 2018-09-15 DIAGNOSIS — R0902 Hypoxemia: Secondary | ICD-10-CM | POA: Diagnosis not present

## 2018-09-15 DIAGNOSIS — F4321 Adjustment disorder with depressed mood: Secondary | ICD-10-CM | POA: Diagnosis not present

## 2018-09-15 DIAGNOSIS — R5381 Other malaise: Secondary | ICD-10-CM | POA: Diagnosis not present

## 2018-09-15 DIAGNOSIS — R110 Nausea: Secondary | ICD-10-CM | POA: Diagnosis present

## 2018-09-15 DIAGNOSIS — U071 COVID-19: Secondary | ICD-10-CM | POA: Diagnosis not present

## 2018-09-15 DIAGNOSIS — R0602 Shortness of breath: Secondary | ICD-10-CM | POA: Diagnosis not present

## 2018-09-15 DIAGNOSIS — Z7901 Long term (current) use of anticoagulants: Secondary | ICD-10-CM | POA: Diagnosis not present

## 2018-09-15 DIAGNOSIS — I5032 Chronic diastolic (congestive) heart failure: Secondary | ICD-10-CM | POA: Diagnosis present

## 2018-09-15 LAB — RENAL FUNCTION PANEL
Albumin: 3.2 g/dL — ABNORMAL LOW (ref 3.5–5.0)
Anion gap: 18 — ABNORMAL HIGH (ref 5–15)
BUN: 55 mg/dL — ABNORMAL HIGH (ref 8–23)
CO2: 22 mmol/L (ref 22–32)
Calcium: 9 mg/dL (ref 8.9–10.3)
Chloride: 97 mmol/L — ABNORMAL LOW (ref 98–111)
Creatinine, Ser: 9.77 mg/dL — ABNORMAL HIGH (ref 0.44–1.00)
GFR calc Af Amer: 4 mL/min — ABNORMAL LOW (ref >60)
GFR calc non Af Amer: 4 mL/min — ABNORMAL LOW (ref >60)
Glucose, Bld: 140 mg/dL — ABNORMAL HIGH (ref 70–99)
Phosphorus: 4.6 mg/dL (ref 2.5–4.6)
Potassium: 4.7 mmol/L (ref 3.5–5.1)
Sodium: 137 mmol/L (ref 135–145)

## 2018-09-15 LAB — GLUCOSE, CAPILLARY
Glucose-Capillary: 130 mg/dL — ABNORMAL HIGH (ref 70–99)
Glucose-Capillary: 132 mg/dL — ABNORMAL HIGH (ref 70–99)
Glucose-Capillary: 143 mg/dL — ABNORMAL HIGH (ref 70–99)
Glucose-Capillary: 166 mg/dL — ABNORMAL HIGH (ref 70–99)

## 2018-09-15 LAB — LACTATE DEHYDROGENASE: LDH: 227 U/L — ABNORMAL HIGH (ref 98–192)

## 2018-09-15 LAB — FERRITIN: Ferritin: 2199 ng/mL — ABNORMAL HIGH (ref 11–307)

## 2018-09-15 LAB — D-DIMER, QUANTITATIVE: D-Dimer, Quant: 0.86 ug/mL-FEU — ABNORMAL HIGH (ref 0.00–0.50)

## 2018-09-15 LAB — CK: Total CK: 17 U/L — ABNORMAL LOW (ref 38–234)

## 2018-09-15 LAB — C-REACTIVE PROTEIN: CRP: 1.6 mg/dL — ABNORMAL HIGH (ref <1.0)

## 2018-09-15 MED ORDER — AMIODARONE HCL 200 MG PO TABS: ORAL_TABLET | ORAL | 0 refills | Status: DC

## 2018-09-15 MED ORDER — CHLORHEXIDINE GLUCONATE CLOTH 2 % EX PADS: 6.0000 | MEDICATED_PAD | Freq: Every day | CUTANEOUS | Status: DC

## 2018-09-15 NOTE — Progress Notes (Signed)
Nutrition Follow-up  DOCUMENTATION CODES:   Obesity unspecified  INTERVENTION:   -Continue Boost Breeze po TID, each supplement provides 250 kcal and 9 grams of protein -Continue Prostat liquid protein PO 30 ml BID with meals, each supplement provides 100 kcal, 15 grams protein. -Continue Magic cup TID with meals, each supplement provides 290 kcal and 9 grams of protein  NUTRITION DIAGNOSIS:   Increased nutrient needs related to chronic illness(ESRD on HD) as evidenced by estimated needs  GOAL:   Patient will meet greater than or equal to 90% of their needs  MONITOR:   PO intake, Supplement acceptance, Labs, Weight trends, Skin, I & O's  ASSESSMENT:   Jamie Arias is a 63 y.o. female with medical history significant of ESRD on HD MWF, anemia, chronic diastolic congestive heart failure, CAD status post PCI, type 2 diabetes, hypertension, hyperlipidemia, hypothyroidism, and conditions listed below presenting as a transfer from [facility].  COVID-19 positive.  Patient transferred to [facility] for management of COVID-19 and hemodialysis.  Patient states her last dialysis was on Monday, June 15 and since then she has not been able to go because she is not feeling well.  Reports having generalized weakness.  Reports having intermittent tremors for the past few months.  Denies family history of tremors.  Denies any fevers, chills, chest pain, shortness of breath, cough, nausea, vomiting, abdominal pain, diarrhea, dysuria, or urinary frequency/urgency.  No additional history could be obtained from her. Last HD: 7/11  **RD working remotely**  Patient has been refusing to eat meals since 7/11 at dinner. Pt with poor appetite and staff is encouraging patient to eat. Supplements are being offered as well. Will need to continue protein supplements at SNF.   Patient's weight has trended down since admission, -19 lbs since 6/20.   Medications: Phoslo capsule TID, Remeron tablet daily,  Rena-vit tablet daily, Vitamin C tablet daily, Zinc sulfate capsule daily Labs reviewed: CBGs: 132-166 Phos WNL GFR: 4 COVID + last tested 7/12  Diet Order:   Diet Order            Diet regular Room service appropriate? Yes; Fluid consistency: Thin  Diet effective now        Diet - low sodium heart healthy        Diet Carb Modified              EDUCATION NEEDS:   No education needs have been identified at this time  Skin:  Skin Assessment: Reviewed RN Assessment  Last BM:  7/10  Height:   Ht Readings from Last 1 Encounters:  08/23/18 5\' 4"  (1.626 m)    Weight:   Wt Readings from Last 1 Encounters:  09/13/18 87.7 kg    Ideal Body Weight:  54.5 kg  BMI:  Body mass index is 33.19 kg/m.  Estimated Nutritional Needs:   Kcal:  1900-2100  Protein:  95-110 grams  Fluid:  1 L + UOP  Jamie Bibles, MS, RD, LDN [place] Dietitian Pager: [phone] After Hours Pager: [phone]

## 2018-09-15 NOTE — Progress Notes (Signed)
[facility] Progress Note   Assessment/ Plan:   # ESRD MWF:  - Has been accepted to SNF in [place] and therefore will need different HD unit in [place] shift at [place]. - tolerated full HD session in chair 7/8.  HD completed 7/11, next planned HD 7/14 if K is reasonable today.  Repeating RFP - ordered this AM, still pending.    # Anemia of chronic kidney disease: no ESA indicated  # Secondary hyperparathyroidism: Monitor phosphorus level. Continue PhosLo.   # Hypotension/volume: On midodrine for hypotension. Monitor BP  Weights are overall trending down for this admission.  On empiric hydrocortef for adrenal insufficiency (AM cortisol was 18.9).  #COVID-19 positive: Per primary team, s/p steroids  #Altered mental status: ResolvedMinimizing sedatives.    # Depression: per primary  # Afib: new dx, on amiodarone per cardiology.  No AC for now.    # Hypoglycemia: poor PO intake--> staff trying to encourage her to eat, CBGs improved  # Dispo: pending  Subjective:    Required lokelma yesterday for K 5.3 yesterday afternoon.   Per chart review appetite poor but no further hypoglycemia.   I/O cathed for urine myoglobin.  7/12 COVID remains +    Objective:   BP 130/69 (BP Location: Left Arm)   Pulse 96   Temp 98.6 F (37 C) (Oral)   Resp 18   Ht 5\' 4"  (1.626 m)   Wt 87.7 kg   SpO2 100%   BMI 33.19 kg/m   Physical Exam: Exam deferred to COVID + status, relying on exam of other providers and extensive chart review.  Labs: BMET Recent Labs  Lab 09/09/18 0629 09/10/18 5400 09/11/18 0458 09/12/18 0533 09/13/18 0744 09/14/18 0656 09/14/18 1308  NA 140 135 137 135 133* 138 138  K 3.6 4.1 4.8 4.5 5.3* 5.6* 5.3*  CL 95* 93* 96* 95* 92* 100 96*  CO2 26 27 26 25 26  17* 26  GLUCOSE 45* 103* 51* 45* 97 135* 134*  BUN 61* 76* 36* 49* 64* 37* 41*  CREATININE 8.61* 10.36* 6.51* 8.70* 10.47* 7.00* 7.64*  CALCIUM 9.2 9.6 8.8* 8.7* 9.6 8.9  9.4  PHOS 4.3 3.1 2.3* 2.5 3.2 3.1 3.3   CBC Recent Labs  Lab 09/08/18 1357 09/10/18 0822 09/12/18 0533 09/13/18 1304  WBC 13.3* 8.0 6.5 5.1  HGB 12.0 11.4* 11.9* 11.5*  HCT 36.3 34.4* 37.0 35.6*  MCV 100.8* 100.9* 102.8* 102.9*  PLT 161 125* 119* 130*    @IMGRELPRIORS @ Medications:    . amiodarone  200 mg Oral BID  . aspirin EC  81 mg Oral Daily  . atorvastatin  80 mg Oral q1800  . calcium acetate  2,001 mg Oral TID WC  . Chlorhexidine Gluconate Cloth  6 each Topical Q0600  . feeding supplement  1 Container Oral TID BM  . feeding supplement (PRO-STAT SUGAR FREE 64)  30 mL Oral TID BM  . heparin  20 Units/kg Dialysis Once in dialysis  . heparin  5,000 Units Subcutaneous Q8H  . hydrocortisone  15 mg Oral BID  . insulin aspart  0-5 Units Subcutaneous QHS  . insulin aspart  0-9 Units Subcutaneous TID WC  . levothyroxine  50 mcg Oral Q0600  . mouth rinse  15 mL Mouth Rinse BID  . midodrine  10 mg Oral TID WC  . mirtazapine  30 mg Oral QHS  . multivitamin  1 tablet Oral QHS  . pantoprazole  40 mg Oral Daily  .  polyethylene glycol  17 g Oral Daily  . sodium zirconium cyclosilicate  10 g Oral BID  . venlafaxine  75 mg Oral BID WC  . vitamin C  500 mg Oral Daily  . zinc sulfate  220 mg Oral Daily   Jannifer Hick MD [facility] Pager [phone]

## 2018-09-15 NOTE — Progress Notes (Signed)
In and out cath order received from [facility]. Cath done and received about 10 cc of cloudy urine. Patient tolerated well

## 2018-09-15 NOTE — Progress Notes (Signed)
Renal Navigator notes discharge order and summary for patient and left message for CSW/C. Pinion to inquire as to whether patient will transport to SNF today in order to keep OP HD clinic/[place] [facility] updated on start date at [place] shift. CSW contacted Renal Navigator back and stated that she is not covering this unit today. Renal Navigator left message for CM/D. Lovena Le to follow up.  Alphonzo Cruise, [place] Renal Navigator [phone]

## 2018-09-15 NOTE — Progress Notes (Signed)
Patient is for discharge today going to [facility] in [place] [address].  NCM spoke with Suanne Marker at facility they can take patient today, RN to call report to [phone]. [facility] is scheduled for pickup at 1:30.

## 2018-09-15 NOTE — Discharge Summary (Signed)
Physician Discharge Summary  Jamie Arias XBJ:[id number] DOB: 26-Feb-1956 DOA: 08/23/2018  PCP: Glori [name], MD  Admit date: 08/23/2018 Discharge date: 09/15/2018  Admitted From: Home Disposition: SNF  Recommendations for Outpatient Follow-up:  1. Follow up with PCP and cardiology in 1-2 weeks 2. May consider low-dose Lantus if CBG elevated on steroid. 3. Please obtain CBC/BMP/Mag at follow up 4. Please follow up on the following pending results: None  Home Health: Not applicable Equipment/Devices: Not applicable  Discharge Condition: Stable CODE STATUS: Full code  Hospital Course: 63 y.o.femalewith medical history significant ofESRD on HDMWF,anemia, chronic diastolic congestive heart failure, CAD status post PCI, type 2 diabetes, hypertension, hyperlipidemia, hypothyroidism, and conditions listed belowpresenting as a transfer from [facility]. COVID-19positive. Patient transferred to [facility] for management of COVID-19 and hemodialysis.  Patient discharged to SNF in [facility] and CLIP'd SNF closer to her outpatient dialysis center but stayed for one more HD session, then hypoglycemia due to poor p.o. intake in the setting of acute stress disorder from current critical illness. Patient continues to have poor oral intake but her blood glucose has been stable in the last 24 to 48 hours.   See individual problem list below for more.  Discharge Diagnoses:  ESRD/bone mineral disorder -Cleared by nephrology for discharge. -CLIPP'd to outpatient HD center closer to SNF in [place] -She will continue outpatient dialysis at [facility] (COVID shift)  Acute respiratory failure due to COVID-19 related pneumonia -On room air -Discharge on oral prednisone taper partly due to concern for adrenal insufficiency after prolonged steroid therapy  New A. Fib: Rhythm controlled with amiodarone. No anticoagulation given elevated risk of bleeding from COVID-19 per cardiology.   Appreciate cardiology's help. -Per card note on 08/29/2018: amiodarone 400mg  BID x7d, then 200 mg BID x2wks, then 200 mg/day   CAD status post CABG: Stable -Continue home cardiac meds, statin and aspirin  Diastolic CHF: Stable. -Fluid management with HD -Lasix on non-dialysis days.  -Echocardiogram in the future once she recovers from COVID.  Hypotension:  Resolved. -Continue Midodrine -Steroid taper as above  Generalized weakness/deconditioning: -Ongoing PT/OT at SNF  Hypothyroidism -Continue home Synthroid  DM-2 with hypoglycemia: hypoglycemia is due to poor p.o. intake in the setting of acute critical illness and acute distress disorder.  Made medication changes for acute critical illness as below.  Also resumed steroid. -Recommend liberating diet until her oral intake improves, then reinstate low-carb, low-sodium and renal diet -Continue sliding scale -Discontinued Lantus but may resume at facility if she sustains CBG greater than 150 consistently. -Steroid taper as above  Depression/Acute Stress Disorder due to critical illness -Changed Prozac to Effexor -Changed Trazodone to Remeron given poor appetite -Outpatient psych follow-up  Discharge Instructions  Discharge Instructions    Diet - low sodium heart healthy   Complete by: As directed    Renal diet with fluid restriction to 1200 cc   Diet - low sodium heart healthy   Complete by: As directed    Diet Carb Modified   Complete by: As directed    Increase activity slowly   Complete by: As directed    Increase activity slowly   Complete by: As directed      Allergies as of 09/15/2018      Reactions   Nsaids Other (See Comments)   Contraindicated due to kidney disease.   Doxycycline Other (See Comments)   tremor      Medication List    STOP taking these medications   Basaglar  KwikPen 100 UNIT/ML Sopn Replaced by: insulin glargine 100 UNIT/ML injection   gabapentin 300 MG capsule Commonly known  as: NEURONTIN   Insulin Pen Needle 31G X 5 MM Misc Commonly known as: Global Ease Inject Pen Needles   traZODone 50 MG tablet Commonly known as: DESYREL     TAKE these medications   Accu-Chek Aviva Plus w/Device Kit Use as directed to test blood sugar up to three times daily   Accu-Chek FastClix Lancets Misc Check blood glucose twice daily, diagnosis code E11.9   accu-chek soft touch lancets Use as instructed   Accu-Chek Softclix Lancet Dev Kit Use as directed to test blood sugar up to three times daily   acetaminophen 325 MG tablet Commonly known as: TYLENOL Take 650 mg by mouth daily as needed for moderate pain or headache.   Alirocumab 75 MG/ML Soaj Commonly known as: Praluent Inject 75 mg into the skin every 14 (fourteen) days.   amiodarone 200 MG tablet Commonly known as: PACERONE Take 1 tablet (200 mg total) by mouth 2 (two) times daily for 4 days, THEN 1 tablet (200 mg total) daily. Start taking on: September 15, 2018   ascorbic acid 500 MG tablet Commonly known as: VITAMIN C Take 1 tablet (500 mg total) by mouth daily.   aspirin EC 81 MG tablet Take 81 mg by mouth daily.   atorvastatin 80 MG tablet Commonly known as: LIPITOR Take 1 tablet (80 mg total) by mouth daily at 6 PM.   blood glucose meter kit and supplies Kit Dispense based on patient and insurance preference. Use up to twice daily. (FOR ICD-10 E11.9)   Calcium Acetate 668 (169 Ca) MG Tabs Take 2,004 mg by mouth 3 (three) times daily with meals.   ergocalciferol 1.25 MG (50000 UT) capsule Commonly known as: VITAMIN D2 Take 50,000 Units by mouth once a week.   feeding supplement (PRO-STAT SUGAR FREE 64) Liqd Take 30 mLs by mouth 3 (three) times daily between meals.   ferrous sulfate 325 (65 FE) MG tablet Take 325 mg by mouth daily with breakfast.   fluticasone 50 MCG/ACT nasal spray Commonly known as: FLONASE Place 2 sprays into both nostrils daily.   furosemide 20 MG tablet Commonly known  as: LASIX Take 40 mg by mouth every other day. Non-dialysis days   glucose blood test strip Commonly known as: Accu-Chek Aviva Use as instructed to test blood sugar up to 3 times daily   insulin aspart 100 UNIT/ML injection Commonly known as: novoLOG Inject 0-9 Units into the skin 3 (three) times daily with meals.   insulin aspart 100 UNIT/ML injection Commonly known as: novoLOG Inject 0-5 Units into the skin at bedtime.   insulin glargine 100 UNIT/ML injection Commonly known as: LANTUS Inject 0.1 mLs (10 Units total) into the skin daily. If CBG greater than 150 consistently Replaces: Basaglar KwikPen 100 UNIT/ML Sopn   levothyroxine 50 MCG tablet Commonly known as: SYNTHROID Take 1 tablet (50 mcg total) by mouth daily at 6 (six) AM. What changed:   medication strength  how much to take  how to take this  when to take this  additional instructions   lidocaine-prilocaine cream Commonly known as: EMLA Apply 1 application topically as needed (port access).   midodrine 10 MG tablet Commonly known as: PROAMATINE Take 1 tablet (10 mg total) by mouth 3 (three) times daily with meals. What changed:   how much to take  how to take this  when to take this  additional instructions  Another medication with the same name was removed. Continue taking this medication, and follow the directions you see here.   mirtazapine 15 MG tablet Commonly known as: REMERON Take 1 tablet (15 mg total) by mouth at bedtime.   multivitamin Tabs tablet Take 1 tablet by mouth at bedtime.   nitroGLYCERIN 0.4 MG SL tablet Commonly known as: NITROSTAT DISSOLVE ONE TABLET UNDER THE TONGUE EVERY 5 MINUTES AS NEEDED FOR CHEST PAIN.  DO NOT EXCEED A TOTAL OF 3 DOSES IN 15 MINUTES What changed: See the new instructions.   omeprazole 20 MG capsule Commonly known as: PRILOSEC Take 20 mg by mouth 2 (two) times daily before a meal.   ondansetron 4 MG disintegrating tablet Commonly known as:  ZOFRAN-ODT Take 4 mg by mouth every 8 (eight) hours as needed for nausea or vomiting.   polyethylene glycol 17 g packet Commonly known as: MIRALAX / GLYCOLAX Take 17 g by mouth daily.   predniSONE 10 MG tablet Commonly known as: DELTASONE Take 4 tablets (40 mg total) by mouth daily for 2 days, THEN 3 tablets (30 mg total) daily for 2 days, THEN 2 tablets (20 mg total) daily for 2 days, THEN 1 tablet (10 mg total) daily for 2 days, THEN 0.5 tablets (5 mg total) daily for 3 days. Start taking on: September 12, 2018   venlafaxine 75 MG tablet Commonly known as: EFFEXOR Take 1 tablet (75 mg total) by mouth 2 (two) times daily with a meal.   zinc sulfate 220 (50 Zn) MG capsule Take 1 capsule (220 mg total) by mouth daily.            Durable Medical Equipment  (From admission, onward)         Start     Ordered   08/30/18 0551  For home use only DME Walker rolling  Once    Question:  Patient needs a walker to treat with the following condition  Answer:  Ambulatory dysfunction   08/30/18 0550   08/28/18 1303  For home use only DME Bedside commode  Once    Comments: 3 n 1  Question:  Patient needs a bedside commode to treat with the following condition  Answer:  Ambulatory dysfunction   08/28/18 1303         Follow-up Information    Glori [name], MD. Schedule an appointment as soon as possible for a visit in 1 week(s).   Specialty: Family Medicine Contact information: [address] [place] [address] [phone]        Iran Ouch, MD .   Specialty: Cardiology Contact information: [address] [place] [address] [phone]           Consultations:  Nephrology  Procedures/Studies:  2D Echo: None  Ct Head Wo Contrast  Result Date: 08/23/2018 CLINICAL DATA:  Dizziness and weakness with tremors. Dialysis patient. EXAM: CT HEAD WITHOUT CONTRAST TECHNIQUE: Contiguous axial images were obtained from the base of  the skull through the vertex without intravenous contrast. COMPARISON:  09/04/2017 FINDINGS: Brain: Ventricles, cisterns and other CSF spaces are within normal. There is no mass, mass effect, shift of midline structures or acute hemorrhage. There is mild chronic ischemic microvascular disease present. Vascular: No hyperdense vessel or unexpected calcification. Skull: Normal. Negative for fracture or focal lesion. Sinuses/Orbits: Orbits are normal. Minimal mucosal membrane thickening over the paranasal sinuses. Mastoid air cells are clear. Other: None. IMPRESSION: No acute findings. Mild  chronic ischemic microvascular disease. Minimal chronic sinus inflammatory change. Electronically Signed   By: Elberta Fortis M.D.   On: 08/23/2018 12:18   Ct Chest Wo Contrast  Result Date: 08/24/2018 CLINICAL DATA:  COVID-19 positive, dyspnea EXAM: CT CHEST WITHOUT CONTRAST TECHNIQUE: Multidetector CT imaging of the chest was performed following the standard protocol without IV contrast. COMPARISON:  Chest radiograph from one day prior. 10/23/2015 chest CT. FINDINGS: Cardiovascular: Top-normal heart size. No significant pericardial effusion/thickening. Three-vessel coronary atherosclerosis status post CABG. Atherosclerotic nonaneurysmal thoracic aorta. Dilated main pulmonary artery (3.7 cm diameter). Partially visualized right axillary and proximal right upper extremity venous stents. Mediastinum/Nodes: No discrete thyroid nodules. Unremarkable esophagus. No pathologically enlarged axillary, mediastinal or hilar lymph nodes, noting limited sensitivity for the detection of hilar adenopathy on this noncontrast study. Lungs/Pleura: No pneumothorax. No pleural effusion. Chronic rounded atelectasis in dependent basilar left lower lobe adjacent to mild left pleural thickening. Patchy ground-glass opacities throughout both lungs involving all lung lobes, most prominent in the left upper lobe. No central airway stenoses. No discrete  lung masses or significant pulmonary nodules. Upper abdomen: Cholecystectomy. Musculoskeletal: No aggressive appearing focal osseous lesions. Intact sternotomy wires. Moderate thoracic spondylosis. IMPRESSION: 1. Patchy ground-glass opacities throughout both lungs involving all lung lobes, most prominent in the left upper lobe, compatible with known COVID-19 pneumonia. There are a spectrum of findings in the lungs which can be seen with acute atypical infection (as well as other non-infectious etiologies). In particular, viral pneumonia (including COVID-19) should be considered in the appropriate clinical setting. 2. Chronic rounded atelectasis in the dependent left lung base with chronic smooth left pleural thickening. 3. Dilated main pulmonary artery, suggesting pulmonary arterial hypertension. Aortic Atherosclerosis (ICD10-I70.0). Electronically Signed   By: Delbert Phenix M.D.   On: 08/24/2018 08:33   Dg Chest Portable 1 View  Result Date: 08/23/2018 CLINICAL DATA:  Dizziness and weakness.  Dialysis. EXAM: PORTABLE CHEST 1 VIEW COMPARISON:  09/04/2017 FINDINGS: Lungs are adequately inflated without lobar consolidation or effusion. Mild stable cardiomegaly. Remainder of the exam is unchanged. IMPRESSION: No acute cardiopulmonary disease. Mild stable cardiomegaly. Electronically Signed   By: Elberta Fortis M.D.   On: 08/23/2018 10:25     Subjective: No major events overnight of this morning.  No further hypoglycemia although she continues to have poor p.o. intake.  Did not require dextrose to sustain CBG.  Continues to feel weak.  Otherwise, no complaints.  Denies chest pain, dyspnea, cough, abdominal pain or GU symptoms.  Continues to have flat affect.   Discharge Exam: Vitals:   09/15/18 0300 09/15/18 0901  BP: 130/69 (!) 113/51  Pulse: 96 99  Resp: 18 (!) 21  Temp: 98.6 F (37 C) 97.8 F (36.6 C)  SpO2: 100% 100%    GENERAL: Feels weak but no acute distress HEENT: MMM.  Vision and hearing  grossly intact.  Moon face? NECK: Supple.  Difficult to assess JVD. LUNGS:  No IWOB.  Fair air movement bilaterally. HEART:  RRR. Heart sounds normal.  ABD: Bowel sounds present. Soft. Non tender.  MSK/EXT:  Moves all extremities. No apparent deformity. No edema bilaterally. SKIN: no apparent skin lesion or wound NEURO: Awake, alert and oriented x4- date.  No gross deficit except for generalized weakness. PSYCH: Flat affect but better today.  The results of significant diagnostics from this hospitalization (including imaging, microbiology, ancillary and laboratory) are listed below for reference.     Microbiology: Recent Results (from the past 240 hour(s))  SARS Coronavirus 2 (  CEPHEID- Performed in [facility] hospital lab), Hosp Order     Status: Abnormal   Collection Time: 09/08/18  4:34 PM   Specimen: Nasopharyngeal Swab  Result Value Ref Range Status   SARS Coronavirus 2 POSITIVE (A) NEGATIVE Final    Comment: RESULT CALLED TO, READ BACK BY AND VERIFIED WITH: Argentina Donovan RN 09/08/18 1738 JDW (NOTE) If result is NEGATIVE SARS-CoV-2 target nucleic acids are NOT DETECTED. The SARS-CoV-2 RNA is generally detectable in upper and lower  respiratory specimens during the acute phase of infection. The lowest  concentration of SARS-CoV-2 viral copies this assay can detect is 250  copies / mL. A negative result does not preclude SARS-CoV-2 infection  and should not be used as the sole basis for treatment or other  patient management decisions.  A negative result may occur with  improper specimen collection / handling, submission of specimen other  than nasopharyngeal swab, presence of viral mutation(s) within the  areas targeted by this assay, and inadequate number of viral copies  (<250 copies / mL). A negative result must be combined with clinical  observations, patient history, and epidemiological information. If result is POSITIVE SARS-CoV-2 target nucleic acids are DETECTED. The  S ARS-CoV-2 RNA is generally detectable in upper and lower  respiratory specimens during the acute phase of infection.  Positive  results are indicative of active infection with SARS-CoV-2.  Clinical  correlation with patient history and other diagnostic information is  necessary to determine patient infection status.  Positive results do  not rule out bacterial infection or co-infection with other viruses. If result is PRESUMPTIVE POSTIVE SARS-CoV-2 nucleic acids MAY BE PRESENT.   A presumptive positive result was obtained on the submitted specimen  and confirmed on repeat testing.  While 2019 novel coronavirus  (SARS-CoV-2) nucleic acids may be present in the submitted sample  additional confirmatory testing may be necessary for epidemiological  and / or clinical management purposes  to differentiate between  SARS-CoV-2 and other Sarbecovirus currently known to infect humans.  If clinically indicated additional testing with an alternate test  methodology [phone]) is adv ised. The SARS-CoV-2 RNA is generally  detectable in upper and lower respiratory specimens during the acute  phase of infection. The expected result is Negative. Fact Sheet for Patients:  BoilerBrush.com.cy Fact Sheet for Healthcare Providers: https://pope.com/ This test is not yet approved or cleared by the [place] FDA and has been authorized for detection and/or diagnosis of SARS-CoV-2 by FDA under an Emergency Use Authorization (EUA).  This EUA will remain in effect (meaning this test can be used) for the duration of the COVID-19 declaration under Section 564(b)(1) of the Act, 21 U.S.C. section 360bbb-3(b)(1), unless the authorization is terminated or revoked sooner. Performed at [facility], [address], [place], [place] [address]   SARS Coronavirus 2 (CEPHEID- Performed in [facility] hospital lab), Hosp Order     Status: Abnormal   Collection  Time: 09/14/18  4:08 PM   Specimen: Nasopharyngeal Swab  Result Value Ref Range Status   SARS Coronavirus 2 POSITIVE (A) NEGATIVE Final    Comment: CRITICAL RESULT CALLED TO, READ BACK BY AND VERIFIED WITH: RN CHARLOTTE KYEI [phone] FCP (NOTE) If result is NEGATIVE SARS-CoV-2 target nucleic acids are NOT DETECTED. The SARS-CoV-2 RNA is generally detectable in upper and lower  respiratory specimens during the acute phase of infection. The lowest  concentration of SARS-CoV-2 viral copies this assay can detect is 250  copies / mL.  A negative result does not preclude SARS-CoV-2 infection  and should not be used as the sole basis for treatment or other  patient management decisions.  A negative result may occur with  improper specimen collection / handling, submission of specimen other  than nasopharyngeal swab, presence of viral mutation(s) within the  areas targeted by this assay, and inadequate number of viral copies  (<250 copies / mL). A negative result must be combined with clinical  observations, patient history, and epidemiological information. If result is POSITIVE SARS-CoV-2 target nucleic acids are DETEC TED. The SARS-CoV-2 RNA is generally detectable in upper and lower  respiratory specimens during the acute phase of infection.  Positive  results are indicative of active infection with SARS-CoV-2.  Clinical  correlation with patient history and other diagnostic information is  necessary to determine patient infection status.  Positive results do  not rule out bacterial infection or co-infection with other viruses. If result is PRESUMPTIVE POSTIVE SARS-CoV-2 nucleic acids MAY BE PRESENT.   A presumptive positive result was obtained on the submitted specimen  and confirmed on repeat testing.  While 2019 novel coronavirus  (SARS-CoV-2) nucleic acids may be present in the submitted sample  additional confirmatory testing may be necessary for epidemiological  and / or  clinical management purposes  to differentiate between  SARS-CoV-2 and other Sarbecovirus currently known to infect humans.  If clinically indicated additional testing with an alternate test  methodology ([id number]) is advised. The SARS-CoV-2 RNA is generally  detectable in upper and lower respiratory specimens during the acute  phase of infection. The expected result is Negative. Fact Sheet for Patients:  BoilerBrush.com.cy Fact Sheet for Healthcare Providers: https://pope.com/ This test is not yet approved or cleared by the [place] FDA and has been authorized for detection and/or diagnosis of SARS-CoV-2 by FDA under an Emergency Use Authorization (EUA).  This EUA will remain in effect (meaning this test can be used) for the duration of the COVID-19 declaration under Section 564(b)(1) of the Act, 21 U.S.C. section 360bbb-3(b)(1), unless the authorization is terminated or revoked sooner. Performed at [facility], [address], [place], [place] [address]      Labs: BNP (last 3 results) No results for input(s): BNP in the last 8760 hours. Basic Metabolic Panel: Recent Labs  Lab 09/12/18 0533 09/13/18 0744 09/14/18 0656 09/14/18 1308 09/15/18 0724  NA 135 133* 138 138 137  K 4.5 5.3* 5.6* 5.3* 4.7  CL 95* 92* 100 96* 97*  CO2 25 26 17* 26 22  GLUCOSE 45* 97 135* 134* 140*  BUN 49* 64* 37* 41* 55*  CREATININE 8.70* 10.47* 7.00* 7.64* 9.77*  CALCIUM 8.7* 9.6 8.9 9.4 9.0  MG  --   --  2.4  --   --   PHOS 2.5 3.2 3.1 3.3 4.6   Liver Function Tests: Recent Labs  Lab 09/12/18 0533 09/13/18 0744 09/14/18 0656 09/14/18 1308 09/15/18 0724  ALBUMIN 2.9* 2.9* 2.7* 3.2* 3.2*   No results for input(s): LIPASE, AMYLASE in the last 168 hours. Recent Labs  Lab 09/11/18 1014  AMMONIA 11   CBC: Recent Labs  Lab 09/08/18 1357 09/10/18 0822 09/12/18 0533 09/13/18 1304  WBC 13.3* 8.0 6.5 5.1  HGB 12.0 11.4*  11.9* 11.5*  HCT 36.3 34.4* 37.0 35.6*  MCV 100.8* 100.9* 102.8* 102.9*  PLT 161 125* 119* 130*   Cardiac Enzymes: Recent Labs  Lab 09/15/18 0724  CKTOTAL 17*   BNP: Invalid input(s): POCBNP CBG:  Recent Labs  Lab 09/14/18 1543 09/14/18 2038 09/15/18 0002 09/15/18 0454 09/15/18 0858  GLUCAP 128* 85 143* 166* 132*   D-Dimer Recent Labs    09/15/18 0724  DDIMER 0.86*   Hgb A1c No results for input(s): HGBA1C in the last 72 hours. Lipid Profile No results for input(s): CHOL, HDL, LDLCALC, TRIG, CHOLHDL, LDLDIRECT in the last 72 hours. Thyroid function studies No results for input(s): TSH, T4TOTAL, T3FREE, THYROIDAB in the last 72 hours.  Invalid input(s): FREET3 Anemia work up No results for input(s): VITAMINB12, FOLATE, FERRITIN, TIBC, IRON, RETICCTPCT in the last 72 hours. Urinalysis    Component Value Date/Time   COLORURINE YELLOW (A) 08/23/2018 1209   APPEARANCEUR CLOUDY (A) 08/23/2018 1209   APPEARANCEUR Hazy 07/23/2013 0741   LABSPEC 1.016 08/23/2018 1209   LABSPEC 1.016 07/23/2013 0741   PHURINE 5.0 08/23/2018 1209   GLUCOSEU NEGATIVE 08/23/2018 1209   GLUCOSEU Negative 07/23/2013 0741   HGBUR SMALL (A) 08/23/2018 1209   BILIRUBINUR NEGATIVE 08/23/2018 1209   BILIRUBINUR Negative 07/23/2013 0741   KETONESUR NEGATIVE 08/23/2018 1209   PROTEINUR 100 (A) 08/23/2018 1209   UROBILINOGEN 0.2 06/30/2013 2213   NITRITE NEGATIVE 08/23/2018 1209   LEUKOCYTESUR SMALL (A) 08/23/2018 1209   LEUKOCYTESUR Negative 07/23/2013 0741   Sepsis Labs Invalid input(s): PROCALCITONIN,  WBC,  LACTICIDVEN   Time coordinating discharge: 35 minutes  SIGNED:  Almon Hercules, MD  [facility] 09/15/2018, 10:40 AM  If 7PM-7AM, please contact night-coverage [email or web address] Password [email or web address]

## 2018-09-15 NOTE — TOC Transition Note (Deleted)
Transition of Care [facility]) - CM/SW Discharge Note   Patient Details  Name: Jamie Arias MRN: [id number] Date of Birth: 1955/06/30  Transition of Care [facility]) CM/SW Contact:  Zenon Mayo, RN Phone Number: 09/15/2018, 12:50 PM   Clinical Narrative:    Patient is for dc today to [facility] , [place], [place] [address], [place] notified Suanne Marker at the facility, RN taking care of patient and patient spouse (left vm) , RN to call report to [phone].  PTAR scheduled for pick at 1:30 pm. Ambulance forms on chart.    Final next level of care: Skilled Nursing Facility Barriers to Discharge: No Barriers Identified   Patient Goals and CMS Choice Patient states their goals for this hospitalization and ongoing recovery are:: SNF CMS Medicare.gov Compare Post Acute Care list provided to:: Patient Choice offered to / list presented to : Patient  Discharge Placement              Patient chooses bed at: [facility] Patient to be transferred to facility by: [place] Name of family member notified: Hayes Rehfeldt Patient and family notified of of transfer: 09/15/18  Discharge Plan and Services In-house Referral: Clinical Social Work Discharge Planning Services: NA Post Acute Care Choice: [place]          DME Arranged: (NA) DME Agency: NA       HH Arranged: NA HH Agency: NA        Social Determinants of Health (SDOH) Interventions     Readmission Risk Interventions No flowsheet data found.

## 2018-09-15 NOTE — Progress Notes (Signed)
New order to collect myoglobin urine and patient is HD and anuric. Paged [facility] to get I & O cath order to see if I can get any urine out.

## 2018-09-15 NOTE — Progress Notes (Signed)
Renal Navigator received call back from CM/D. Lovena Le confirming patient's discharge to SNF/[facility] in [place] today.  Renal Navigator informed OP HD clinic/[place] [facility] that patient is discharging today and will start in the isolation COVID shift tomorrow, 09/16/18. Discharge summary faxed to clinic per request.  Alphonzo Cruise, [place] Renal Navigator [phone]

## 2018-09-15 NOTE — TOC Transition Note (Signed)
Transition of Care [facility]) - CM/SW Discharge Note   Patient Details  Name: Jamie Arias MRN: [id number] Date of Birth: 1955-12-17  Transition of Care [facility]) CM/SW Contact:  Zenon Mayo, RN Phone Number: 09/15/2018, 12:57 PM   Clinical Narrative:    Patient is for dc today to [facility] , [place], [place] [address], [place] notified Suanne Marker at the facility, RN taking care of patient and patient spouse (left vm) , RN to call report to [phone].  PTAR scheduled for pick at 1:30 pm. Ambulance forms on chart.     Final next level of care: Skilled Nursing Facility Barriers to Discharge: No Barriers Identified   Patient Goals and CMS Choice Patient states their goals for this hospitalization and ongoing recovery are:: SNF CMS Medicare.gov Compare Post Acute Care list provided to:: Patient Choice offered to / list presented to : Patient  Discharge Placement PASRR number recieved: 09/06/18            Patient chooses bed at: Other - please specify in the comment section below:([facility] in [place]) Patient to be transferred to facility by: [place] Name of family member notified: Tuyen Uncapher Patient and family notified of of transfer: 09/15/18  Discharge Plan and Services In-house Referral: Clinical Social Work Discharge Planning Services: NA Post Acute Care Choice: [place]          DME Arranged: (NA) DME Agency: NA       HH Arranged: NA HH Agency: NA        Social Determinants of Health ([place]) Interventions     Readmission Risk Interventions Readmission Risk Prevention Plan 09/15/2018  Transportation Screening Complete  Medication Review Press photographer) Complete  PCP or Specialist appointment within 3-5 days of discharge Complete  HRI or [place] Complete  SW Recovery Care/Counseling Consult Complete  [place] Complete  Some recent data might be hidden

## 2018-09-15 NOTE — Care Management Important Message (Signed)
Important Message  Patient Details  Name: Jamie Arias MRN: [id number] Date of Birth: Oct 12, 1955   Medicare Important Message Given:  Yes     Zenon Mayo, RN 09/15/2018, 1:37 PM

## 2018-09-15 NOTE — Consult Note (Signed)
   [facility] CM Inpatient Consult   09/15/2018  Jamie Arias 09/11/1955 [id number]    Follow-up note:  Patient is being followed for disposition and needs due to extreme high risk score (42%) for unplanned readmission and hospitalizations under her Medicare/ NextGen plan.   Transition of care CM notes reviewed, reveal that patient is not be eligible for CIR due to still testing positive for COVID, however, will discharge to SNF-skilled nursing facility today- going to [facility] in [place] [address], spouse Jamie Arias) aware. Patient would still be able to receive hemodialysis when in [place] (the only facility near [place] that is accepting COVID + patients and they can assist her with getting to her dialysis treatments).  For any changes incurrentdisposition needs, pleaserefer to [facility] care managementforfollow-up as appropriate.  Of note, [facility] Care Management services does not replace or interfere with any services that are arranged by transition of care case management or social work.   For questions and referral, please call:  Edwena Felty A. Tauni Sanks, BSN, RN-BC [facility] Liaison Cell: [phone]

## 2018-09-16 DIAGNOSIS — U071 COVID-19: Secondary | ICD-10-CM | POA: Diagnosis not present

## 2018-09-16 DIAGNOSIS — R5381 Other malaise: Secondary | ICD-10-CM | POA: Diagnosis not present

## 2018-09-16 LAB — MYOGLOBIN, SERUM: Myoglobin: 133 ng/mL — ABNORMAL HIGH (ref 25–58)

## 2018-09-16 LAB — MYOGLOBIN, URINE: Myoglobin, Ur: 41 ng/mL — ABNORMAL HIGH (ref 0–13)

## 2018-09-18 DIAGNOSIS — I4891 Unspecified atrial fibrillation: Secondary | ICD-10-CM | POA: Diagnosis not present

## 2018-09-18 DIAGNOSIS — Z9181 History of falling: Secondary | ICD-10-CM | POA: Diagnosis not present

## 2018-09-18 DIAGNOSIS — R0902 Hypoxemia: Secondary | ICD-10-CM | POA: Diagnosis not present

## 2018-09-18 DIAGNOSIS — Z7401 Bed confinement status: Secondary | ICD-10-CM | POA: Diagnosis not present

## 2018-09-18 DIAGNOSIS — N186 End stage renal disease: Secondary | ICD-10-CM | POA: Diagnosis not present

## 2018-09-18 DIAGNOSIS — D539 Nutritional anemia, unspecified: Secondary | ICD-10-CM | POA: Diagnosis present

## 2018-09-18 DIAGNOSIS — Z7982 Long term (current) use of aspirin: Secondary | ICD-10-CM | POA: Diagnosis not present

## 2018-09-18 DIAGNOSIS — G4700 Insomnia, unspecified: Secondary | ICD-10-CM | POA: Diagnosis present

## 2018-09-18 DIAGNOSIS — R0689 Other abnormalities of breathing: Secondary | ICD-10-CM | POA: Diagnosis not present

## 2018-09-18 DIAGNOSIS — J1289 Other viral pneumonia: Secondary | ICD-10-CM | POA: Diagnosis present

## 2018-09-18 DIAGNOSIS — K219 Gastro-esophageal reflux disease without esophagitis: Secondary | ICD-10-CM | POA: Diagnosis present

## 2018-09-18 DIAGNOSIS — R918 Other nonspecific abnormal finding of lung field: Secondary | ICD-10-CM | POA: Diagnosis not present

## 2018-09-18 DIAGNOSIS — G9341 Metabolic encephalopathy: Secondary | ICD-10-CM | POA: Diagnosis not present

## 2018-09-18 DIAGNOSIS — D631 Anemia in chronic kidney disease: Secondary | ICD-10-CM | POA: Diagnosis not present

## 2018-09-18 DIAGNOSIS — I5030 Unspecified diastolic (congestive) heart failure: Secondary | ICD-10-CM | POA: Diagnosis not present

## 2018-09-18 DIAGNOSIS — J9601 Acute respiratory failure with hypoxia: Secondary | ICD-10-CM | POA: Diagnosis not present

## 2018-09-18 DIAGNOSIS — E875 Hyperkalemia: Secondary | ICD-10-CM | POA: Diagnosis present

## 2018-09-18 DIAGNOSIS — U071 COVID-19: Secondary | ICD-10-CM | POA: Diagnosis not present

## 2018-09-18 DIAGNOSIS — I2510 Atherosclerotic heart disease of native coronary artery without angina pectoris: Secondary | ICD-10-CM | POA: Diagnosis present

## 2018-09-18 DIAGNOSIS — Z79899 Other long term (current) drug therapy: Secondary | ICD-10-CM | POA: Diagnosis not present

## 2018-09-18 DIAGNOSIS — F39 Unspecified mood [affective] disorder: Secondary | ICD-10-CM | POA: Diagnosis present

## 2018-09-18 DIAGNOSIS — R5381 Other malaise: Secondary | ICD-10-CM | POA: Diagnosis present

## 2018-09-18 DIAGNOSIS — I480 Paroxysmal atrial fibrillation: Secondary | ICD-10-CM | POA: Diagnosis not present

## 2018-09-18 DIAGNOSIS — N2581 Secondary hyperparathyroidism of renal origin: Secondary | ICD-10-CM | POA: Diagnosis not present

## 2018-09-18 DIAGNOSIS — R4182 Altered mental status, unspecified: Secondary | ICD-10-CM | POA: Diagnosis not present

## 2018-09-18 DIAGNOSIS — Z992 Dependence on renal dialysis: Secondary | ICD-10-CM | POA: Diagnosis not present

## 2018-09-18 DIAGNOSIS — Z794 Long term (current) use of insulin: Secondary | ICD-10-CM | POA: Diagnosis not present

## 2018-09-18 DIAGNOSIS — Z951 Presence of aortocoronary bypass graft: Secondary | ICD-10-CM | POA: Diagnosis not present

## 2018-09-18 DIAGNOSIS — E119 Type 2 diabetes mellitus without complications: Secondary | ICD-10-CM | POA: Diagnosis not present

## 2018-09-18 DIAGNOSIS — E1122 Type 2 diabetes mellitus with diabetic chronic kidney disease: Secondary | ICD-10-CM | POA: Diagnosis present

## 2018-09-18 DIAGNOSIS — M6281 Muscle weakness (generalized): Secondary | ICD-10-CM | POA: Diagnosis not present

## 2018-09-18 DIAGNOSIS — R404 Transient alteration of awareness: Secondary | ICD-10-CM | POA: Diagnosis not present

## 2018-09-18 DIAGNOSIS — E039 Hypothyroidism, unspecified: Secondary | ICD-10-CM | POA: Diagnosis present

## 2018-09-18 DIAGNOSIS — Z7901 Long term (current) use of anticoagulants: Secondary | ICD-10-CM | POA: Diagnosis not present

## 2018-09-18 DIAGNOSIS — E785 Hyperlipidemia, unspecified: Secondary | ICD-10-CM | POA: Diagnosis not present

## 2018-09-18 DIAGNOSIS — R110 Nausea: Secondary | ICD-10-CM | POA: Diagnosis present

## 2018-09-18 DIAGNOSIS — I5032 Chronic diastolic (congestive) heart failure: Secondary | ICD-10-CM | POA: Diagnosis not present

## 2018-09-18 DIAGNOSIS — I4892 Unspecified atrial flutter: Secondary | ICD-10-CM | POA: Diagnosis present

## 2018-09-18 DIAGNOSIS — R0602 Shortness of breath: Secondary | ICD-10-CM | POA: Diagnosis not present

## 2018-09-18 DIAGNOSIS — R410 Disorientation, unspecified: Secondary | ICD-10-CM | POA: Diagnosis not present

## 2018-09-19 DIAGNOSIS — U071 COVID-19: Secondary | ICD-10-CM | POA: Insufficient documentation

## 2018-09-19 DIAGNOSIS — R4182 Altered mental status, unspecified: Secondary | ICD-10-CM | POA: Insufficient documentation

## 2018-09-24 NOTE — Telephone Encounter (Signed)
No ans no vm unable to contact   Mailing letter closing encounter

## 2018-09-26 MED ORDER — PHENYLEPH-POT GUAIACOLSULF
81.00 | Status: DC
Start: 2018-09-27 — End: 2018-09-26

## 2018-09-26 MED ORDER — DAMOR DRESSING EX PADS
10.00 | MEDICATED_PAD | CUTANEOUS | Status: DC
Start: 2018-09-27 — End: 2018-09-26

## 2018-09-26 MED ORDER — BAYER WOMENS 81-300 MG PO TABS
40.00 | ORAL_TABLET | ORAL | Status: DC
Start: 2018-09-26 — End: 2018-09-26

## 2018-09-26 MED ORDER — FLUOXETINE HCL 20 MG PO CAPS
20.00 | ORAL_CAPSULE | ORAL | Status: DC
Start: 2018-09-27 — End: 2018-09-26

## 2018-09-26 MED ORDER — ACETAMINOPHEN 325 MG PO TABS
650.00 | ORAL_TABLET | ORAL | Status: DC
Start: ? — End: 2018-09-26

## 2018-09-26 MED ORDER — Medication
7.00 | Status: DC
Start: 2018-09-26 — End: 2018-09-26

## 2018-09-26 MED ORDER — ASPIRIN-SALICYLAMIDE-CAFFEINE 650-195-32 MG PO PACK
75.00 | PACK | ORAL | Status: DC
Start: 2018-09-27 — End: 2018-09-26

## 2018-09-26 MED ORDER — BROMHIST-NR PO
0.00 | ORAL | Status: DC
Start: 2018-09-27 — End: 2018-09-26

## 2018-09-26 MED ORDER — BENICAR 20 MG PO TABS
12.50 | ORAL_TABLET | ORAL | Status: DC
Start: ? — End: 2018-09-26

## 2018-09-26 MED ORDER — AMIODARONE HCL IV
200.00 | INTRAVENOUS | Status: DC
Start: 2018-09-27 — End: 2018-09-26

## 2018-09-26 MED ORDER — CVS KIDPANT BOYS X-LARGE MISC
80.00 | Status: DC
Start: 2018-09-27 — End: 2018-09-26

## 2018-09-26 MED ORDER — APIXABAN 2.5 MG PO TABS
5.00 | ORAL_TABLET | ORAL | Status: DC
Start: 2018-09-26 — End: 2018-09-26

## 2018-09-26 MED ORDER — Medication
10.00 | Status: DC
Start: ? — End: 2018-09-26

## 2018-09-26 MED ORDER — PRIMAQUINE PHOSPHATE POWD
1.00 | Status: DC
Start: ? — End: 2018-09-26

## 2018-09-26 MED ORDER — ONDANSETRON HCL 4 MG PO TABS
4.00 | ORAL_TABLET | ORAL | Status: DC
Start: ? — End: 2018-09-26

## 2018-09-29 DIAGNOSIS — Z7901 Long term (current) use of anticoagulants: Secondary | ICD-10-CM | POA: Diagnosis not present

## 2018-09-29 DIAGNOSIS — E1122 Type 2 diabetes mellitus with diabetic chronic kidney disease: Secondary | ICD-10-CM | POA: Diagnosis not present

## 2018-09-29 DIAGNOSIS — R4182 Altered mental status, unspecified: Secondary | ICD-10-CM | POA: Diagnosis not present

## 2018-09-29 DIAGNOSIS — Z992 Dependence on renal dialysis: Secondary | ICD-10-CM | POA: Diagnosis not present

## 2018-09-29 DIAGNOSIS — R531 Weakness: Secondary | ICD-10-CM | POA: Diagnosis not present

## 2018-09-29 DIAGNOSIS — Z7982 Long term (current) use of aspirin: Secondary | ICD-10-CM | POA: Diagnosis not present

## 2018-09-29 DIAGNOSIS — D689 Coagulation defect, unspecified: Secondary | ICD-10-CM | POA: Diagnosis not present

## 2018-09-29 DIAGNOSIS — G4700 Insomnia, unspecified: Secondary | ICD-10-CM | POA: Diagnosis not present

## 2018-09-29 DIAGNOSIS — I132 Hypertensive heart and chronic kidney disease with heart failure and with stage 5 chronic kidney disease, or end stage renal disease: Secondary | ICD-10-CM | POA: Diagnosis not present

## 2018-09-29 DIAGNOSIS — D539 Nutritional anemia, unspecified: Secondary | ICD-10-CM | POA: Diagnosis not present

## 2018-09-29 DIAGNOSIS — U071 COVID-19: Secondary | ICD-10-CM | POA: Diagnosis not present

## 2018-09-29 DIAGNOSIS — I4892 Unspecified atrial flutter: Secondary | ICD-10-CM | POA: Diagnosis not present

## 2018-09-29 DIAGNOSIS — Z951 Presence of aortocoronary bypass graft: Secondary | ICD-10-CM | POA: Diagnosis not present

## 2018-09-29 DIAGNOSIS — J189 Pneumonia, unspecified organism: Secondary | ICD-10-CM | POA: Diagnosis not present

## 2018-09-29 DIAGNOSIS — I480 Paroxysmal atrial fibrillation: Secondary | ICD-10-CM | POA: Diagnosis not present

## 2018-09-29 DIAGNOSIS — R918 Other nonspecific abnormal finding of lung field: Secondary | ICD-10-CM | POA: Diagnosis not present

## 2018-09-29 DIAGNOSIS — Z6829 Body mass index (BMI) 29.0-29.9, adult: Secondary | ICD-10-CM | POA: Diagnosis not present

## 2018-09-29 DIAGNOSIS — I5032 Chronic diastolic (congestive) heart failure: Secondary | ICD-10-CM | POA: Diagnosis not present

## 2018-09-29 DIAGNOSIS — Z9049 Acquired absence of other specified parts of digestive tract: Secondary | ICD-10-CM | POA: Diagnosis not present

## 2018-09-29 DIAGNOSIS — R251 Tremor, unspecified: Secondary | ICD-10-CM | POA: Diagnosis not present

## 2018-09-29 DIAGNOSIS — E039 Hypothyroidism, unspecified: Secondary | ICD-10-CM | POA: Diagnosis not present

## 2018-09-29 DIAGNOSIS — N186 End stage renal disease: Secondary | ICD-10-CM | POA: Diagnosis not present

## 2018-09-29 DIAGNOSIS — E785 Hyperlipidemia, unspecified: Secondary | ICD-10-CM | POA: Diagnosis not present

## 2018-09-29 DIAGNOSIS — K219 Gastro-esophageal reflux disease without esophagitis: Secondary | ICD-10-CM | POA: Diagnosis not present

## 2018-09-29 DIAGNOSIS — I2510 Atherosclerotic heart disease of native coronary artery without angina pectoris: Secondary | ICD-10-CM | POA: Diagnosis not present

## 2018-09-29 DIAGNOSIS — E1140 Type 2 diabetes mellitus with diabetic neuropathy, unspecified: Secondary | ICD-10-CM | POA: Diagnosis not present

## 2018-09-29 DIAGNOSIS — G9341 Metabolic encephalopathy: Secondary | ICD-10-CM | POA: Diagnosis not present

## 2018-09-29 DIAGNOSIS — Z6836 Body mass index (BMI) 36.0-36.9, adult: Secondary | ICD-10-CM | POA: Diagnosis not present

## 2018-09-29 DIAGNOSIS — J1289 Other viral pneumonia: Secondary | ICD-10-CM | POA: Diagnosis not present

## 2018-09-30 ENCOUNTER — Telehealth: Payer: Self-pay

## 2018-09-30 DIAGNOSIS — Z951 Presence of aortocoronary bypass graft: Secondary | ICD-10-CM | POA: Diagnosis not present

## 2018-09-30 DIAGNOSIS — E1159 Type 2 diabetes mellitus with other circulatory complications: Secondary | ICD-10-CM | POA: Diagnosis not present

## 2018-09-30 DIAGNOSIS — K5900 Constipation, unspecified: Secondary | ICD-10-CM | POA: Diagnosis not present

## 2018-09-30 DIAGNOSIS — I361 Nonrheumatic tricuspid (valve) insufficiency: Secondary | ICD-10-CM | POA: Diagnosis not present

## 2018-09-30 DIAGNOSIS — D631 Anemia in chronic kidney disease: Secondary | ICD-10-CM | POA: Diagnosis not present

## 2018-09-30 DIAGNOSIS — Z20828 Contact with and (suspected) exposure to other viral communicable diseases: Secondary | ICD-10-CM | POA: Diagnosis present

## 2018-09-30 DIAGNOSIS — Z7982 Long term (current) use of aspirin: Secondary | ICD-10-CM | POA: Diagnosis not present

## 2018-09-30 DIAGNOSIS — E039 Hypothyroidism, unspecified: Secondary | ICD-10-CM | POA: Diagnosis present

## 2018-09-30 DIAGNOSIS — U071 COVID-19: Secondary | ICD-10-CM | POA: Diagnosis not present

## 2018-09-30 DIAGNOSIS — I340 Nonrheumatic mitral (valve) insufficiency: Secondary | ICD-10-CM | POA: Diagnosis not present

## 2018-09-30 DIAGNOSIS — J1289 Other viral pneumonia: Secondary | ICD-10-CM | POA: Diagnosis not present

## 2018-09-30 DIAGNOSIS — Z8619 Personal history of other infectious and parasitic diseases: Secondary | ICD-10-CM | POA: Diagnosis not present

## 2018-09-30 DIAGNOSIS — K219 Gastro-esophageal reflux disease without esophagitis: Secondary | ICD-10-CM | POA: Diagnosis not present

## 2018-09-30 DIAGNOSIS — I2510 Atherosclerotic heart disease of native coronary artery without angina pectoris: Secondary | ICD-10-CM | POA: Diagnosis present

## 2018-09-30 DIAGNOSIS — I358 Other nonrheumatic aortic valve disorders: Secondary | ICD-10-CM | POA: Diagnosis not present

## 2018-09-30 DIAGNOSIS — I4891 Unspecified atrial fibrillation: Secondary | ICD-10-CM | POA: Diagnosis not present

## 2018-09-30 DIAGNOSIS — R918 Other nonspecific abnormal finding of lung field: Secondary | ICD-10-CM | POA: Diagnosis not present

## 2018-09-30 DIAGNOSIS — F39 Unspecified mood [affective] disorder: Secondary | ICD-10-CM | POA: Diagnosis present

## 2018-09-30 DIAGNOSIS — R5381 Other malaise: Secondary | ICD-10-CM | POA: Diagnosis not present

## 2018-09-30 DIAGNOSIS — R531 Weakness: Secondary | ICD-10-CM | POA: Diagnosis not present

## 2018-09-30 DIAGNOSIS — R410 Disorientation, unspecified: Secondary | ICD-10-CM | POA: Diagnosis not present

## 2018-09-30 DIAGNOSIS — J189 Pneumonia, unspecified organism: Secondary | ICD-10-CM | POA: Diagnosis present

## 2018-09-30 DIAGNOSIS — I252 Old myocardial infarction: Secondary | ICD-10-CM | POA: Diagnosis not present

## 2018-09-30 DIAGNOSIS — T82858A Stenosis of vascular prosthetic devices, implants and grafts, initial encounter: Secondary | ICD-10-CM | POA: Diagnosis not present

## 2018-09-30 DIAGNOSIS — R110 Nausea: Secondary | ICD-10-CM | POA: Diagnosis not present

## 2018-09-30 DIAGNOSIS — I132 Hypertensive heart and chronic kidney disease with heart failure and with stage 5 chronic kidney disease, or end stage renal disease: Secondary | ICD-10-CM | POA: Diagnosis present

## 2018-09-30 DIAGNOSIS — Z992 Dependence on renal dialysis: Secondary | ICD-10-CM | POA: Diagnosis not present

## 2018-09-30 DIAGNOSIS — Z79899 Other long term (current) drug therapy: Secondary | ICD-10-CM | POA: Diagnosis not present

## 2018-09-30 DIAGNOSIS — I5032 Chronic diastolic (congestive) heart failure: Secondary | ICD-10-CM | POA: Diagnosis present

## 2018-09-30 DIAGNOSIS — M6281 Muscle weakness (generalized): Secondary | ICD-10-CM | POA: Diagnosis not present

## 2018-09-30 DIAGNOSIS — R4182 Altered mental status, unspecified: Secondary | ICD-10-CM | POA: Diagnosis not present

## 2018-09-30 DIAGNOSIS — G9341 Metabolic encephalopathy: Secondary | ICD-10-CM | POA: Diagnosis present

## 2018-09-30 DIAGNOSIS — E1142 Type 2 diabetes mellitus with diabetic polyneuropathy: Secondary | ICD-10-CM | POA: Diagnosis present

## 2018-09-30 DIAGNOSIS — J90 Pleural effusion, not elsewhere classified: Secondary | ICD-10-CM | POA: Diagnosis not present

## 2018-09-30 DIAGNOSIS — Z794 Long term (current) use of insulin: Secondary | ICD-10-CM | POA: Diagnosis not present

## 2018-09-30 DIAGNOSIS — D689 Coagulation defect, unspecified: Secondary | ICD-10-CM | POA: Diagnosis present

## 2018-09-30 DIAGNOSIS — I959 Hypotension, unspecified: Secondary | ICD-10-CM | POA: Diagnosis not present

## 2018-09-30 DIAGNOSIS — Z6836 Body mass index (BMI) 36.0-36.9, adult: Secondary | ICD-10-CM | POA: Diagnosis not present

## 2018-09-30 DIAGNOSIS — N2581 Secondary hyperparathyroidism of renal origin: Secondary | ICD-10-CM | POA: Diagnosis not present

## 2018-09-30 DIAGNOSIS — N186 End stage renal disease: Secondary | ICD-10-CM | POA: Diagnosis not present

## 2018-09-30 DIAGNOSIS — I4892 Unspecified atrial flutter: Secondary | ICD-10-CM | POA: Diagnosis not present

## 2018-09-30 DIAGNOSIS — E1122 Type 2 diabetes mellitus with diabetic chronic kidney disease: Secondary | ICD-10-CM | POA: Diagnosis not present

## 2018-09-30 DIAGNOSIS — R279 Unspecified lack of coordination: Secondary | ICD-10-CM | POA: Diagnosis not present

## 2018-09-30 DIAGNOSIS — J9811 Atelectasis: Secondary | ICD-10-CM | POA: Diagnosis not present

## 2018-09-30 MED ORDER — FLUOXETINE HCL 20 MG PO CAPS
20.00 | ORAL_CAPSULE | ORAL | Status: DC
Start: 2018-10-11 — End: 2018-09-30

## 2018-09-30 MED ORDER — DEXTROSE 50 % IV SOLN
12.50 | INTRAVENOUS | Status: DC
Start: ? — End: 2018-09-30

## 2018-09-30 MED ORDER — HEPARIN SODIUM (PORCINE) 1000 UNIT/ML IJ SOLN
2000.00 | INTRAMUSCULAR | Status: DC
Start: ? — End: 2018-09-30

## 2018-09-30 MED ORDER — GENERIC EXTERNAL MEDICATION
62.50 | Status: DC
Start: 2018-10-14 — End: 2018-09-30

## 2018-09-30 MED ORDER — CALCITRIOL 0.5 MCG PO CAPS
0.50 | ORAL_CAPSULE | ORAL | Status: DC
Start: ? — End: 2018-09-30

## 2018-09-30 MED ORDER — GENERIC EXTERNAL MEDICATION
75.00 | Status: DC
Start: 2018-10-11 — End: 2018-09-30

## 2018-09-30 MED ORDER — ATORVASTATIN CALCIUM 80 MG PO TABS
80.00 | ORAL_TABLET | ORAL | Status: DC
Start: 2018-10-10 — End: 2018-09-30

## 2018-09-30 MED ORDER — INSULIN LISPRO 100 UNIT/ML ~~LOC~~ SOLN
.00 | SUBCUTANEOUS | Status: DC
Start: 2018-10-10 — End: 2018-09-30

## 2018-09-30 MED ORDER — GENERIC EXTERNAL MEDICATION
Status: DC
Start: ? — End: 2018-09-30

## 2018-09-30 MED ORDER — ALBUMIN HUMAN 25 % IV SOLN
25.00 | INTRAVENOUS | Status: DC
Start: ? — End: 2018-09-30

## 2018-09-30 MED ORDER — DARBEPOETIN ALFA 25 MCG/0.42ML IJ SOSY
15.00 | PREFILLED_SYRINGE | INTRAMUSCULAR | Status: DC
Start: 2018-10-28 — End: 2018-09-30

## 2018-09-30 MED ORDER — FERROUS SULFATE 325 (65 FE) MG PO TABS
325.00 | ORAL_TABLET | ORAL | Status: DC
Start: 2018-10-02 — End: 2018-09-30

## 2018-09-30 MED ORDER — CALCIUM ACETATE (PHOS BINDER) 667 MG PO CAPS
2001.00 | ORAL_CAPSULE | ORAL | Status: DC
Start: 2018-10-01 — End: 2018-09-30

## 2018-09-30 MED ORDER — PANTOPRAZOLE SODIUM 40 MG PO TBEC
40.00 | DELAYED_RELEASE_TABLET | ORAL | Status: DC
Start: 2018-10-10 — End: 2018-09-30

## 2018-09-30 MED ORDER — AMIODARONE HCL 200 MG PO TABS
200.00 | ORAL_TABLET | ORAL | Status: DC
Start: 2018-10-11 — End: 2018-09-30

## 2018-09-30 MED ORDER — ASPIRIN 81 MG PO CHEW
81.00 | CHEWABLE_TABLET | ORAL | Status: DC
Start: 2018-10-11 — End: 2018-09-30

## 2018-09-30 MED ORDER — APIXABAN 5 MG PO TABS
5.00 | ORAL_TABLET | ORAL | Status: DC
Start: 2018-10-10 — End: 2018-09-30

## 2018-09-30 MED ORDER — MIDODRINE HCL 10 MG PO TABS
10.00 | ORAL_TABLET | ORAL | Status: DC
Start: 2018-10-10 — End: 2018-09-30

## 2018-09-30 NOTE — Telephone Encounter (Signed)
LMTCB to get patient scheduled for follow-up to close [facility] care gaps.

## 2018-10-01 ENCOUNTER — Telehealth: Payer: Self-pay

## 2018-10-01 MED ORDER — INSULIN GLARGINE 100 UNIT/ML ~~LOC~~ SOLN
2.00 | SUBCUTANEOUS | Status: DC
Start: 2018-10-01 — End: 2018-10-01

## 2018-10-01 MED ORDER — GENERIC EXTERNAL MEDICATION
Status: DC
Start: ? — End: 2018-10-01

## 2018-10-01 MED ORDER — GENERIC EXTERNAL MEDICATION
1.00 | Status: DC
Start: 2018-10-06 — End: 2018-10-01

## 2018-10-01 NOTE — Telephone Encounter (Signed)
Copied from [place] [phone]. Topic: Referral - Request for Referral >> Oct 01, 2018 11:33 AM Alanda Slim E wrote: Has patient seen PCP for this complaint? No  *If NO, is insurance requiring patient see PCP for this issue before PCP can refer them? Not sure  Referral for which specialty:skilled nursing and physical therapy  Preferred provider/office:  Reason for referral: Pt is in the hospital for weakness in legs

## 2018-10-02 NOTE — Telephone Encounter (Signed)
Copied from [place] [phone]. Topic: Referral - Request for Referral >> Oct 01, 2018 11:33 AM Alanda Slim E wrote: Has patient seen PCP for this complaint? No  *If NO, is insurance requiring patient see PCP for this issue before PCP can refer them? Not sure  Referral for which specialty:skilled nursing and physical therapy  Preferred provider/office:  Reason for referral: Pt is in the hospital for weakness in legs

## 2018-10-02 NOTE — Telephone Encounter (Signed)
Is this for a home health referral? She was discharged to a SNF previously. Has she been discharged home?

## 2018-10-03 DIAGNOSIS — Z992 Dependence on renal dialysis: Secondary | ICD-10-CM | POA: Diagnosis not present

## 2018-10-03 DIAGNOSIS — U071 COVID-19: Secondary | ICD-10-CM | POA: Diagnosis not present

## 2018-10-03 DIAGNOSIS — N186 End stage renal disease: Secondary | ICD-10-CM | POA: Diagnosis not present

## 2018-10-03 NOTE — Telephone Encounter (Signed)
lmtcb for the patient to ask for Jyaire Koudelka.  Abri Vacca,cma

## 2018-10-06 MED ORDER — POLYETHYLENE GLYCOL 3350 17 G PO PACK
17.00 | PACK | ORAL | Status: DC
Start: 2018-10-10 — End: 2018-10-06

## 2018-10-06 MED ORDER — INSULIN GLARGINE 100 UNIT/ML ~~LOC~~ SOLN
5.00 | SUBCUTANEOUS | Status: DC
Start: 2018-10-10 — End: 2018-10-06

## 2018-10-06 MED ORDER — PREDNISONE 5 MG PO TABS
5.00 | ORAL_TABLET | ORAL | Status: DC
Start: 2018-10-10 — End: 2018-10-06

## 2018-10-06 MED ORDER — GABAPENTIN 100 MG PO CAPS
100.00 | ORAL_CAPSULE | ORAL | Status: DC
Start: 2018-10-10 — End: 2018-10-06

## 2018-10-06 MED ORDER — BISACODYL 10 MG RE SUPP
10.00 | RECTAL | Status: DC
Start: ? — End: 2018-10-06

## 2018-10-06 MED ORDER — EPOETIN ALFA-EPBX 2000 UNIT/ML IJ SOLN
1000.00 | INTRAMUSCULAR | Status: DC
Start: ? — End: 2018-10-06

## 2018-10-06 MED ORDER — ONDANSETRON 4 MG PO TBDP
4.00 | ORAL_TABLET | ORAL | Status: DC
Start: ? — End: 2018-10-06

## 2018-10-06 MED ORDER — GENERIC EXTERNAL MEDICATION
2.00 | Status: DC
Start: 2018-10-10 — End: 2018-10-06

## 2018-10-06 NOTE — Telephone Encounter (Signed)
lmtcb and ask for Jamiya Nims.  Alexas Basulto,cma

## 2018-10-09 NOTE — Telephone Encounter (Signed)
Left message to call office.PEC nurse may advise.

## 2018-10-10 DIAGNOSIS — E1142 Type 2 diabetes mellitus with diabetic polyneuropathy: Secondary | ICD-10-CM | POA: Diagnosis not present

## 2018-10-10 DIAGNOSIS — Z8619 Personal history of other infectious and parasitic diseases: Secondary | ICD-10-CM | POA: Diagnosis not present

## 2018-10-10 DIAGNOSIS — Z992 Dependence on renal dialysis: Secondary | ICD-10-CM | POA: Diagnosis not present

## 2018-10-10 DIAGNOSIS — Z794 Long term (current) use of insulin: Secondary | ICD-10-CM | POA: Diagnosis not present

## 2018-10-10 DIAGNOSIS — E1159 Type 2 diabetes mellitus with other circulatory complications: Secondary | ICD-10-CM | POA: Diagnosis not present

## 2018-10-10 DIAGNOSIS — I959 Hypotension, unspecified: Secondary | ICD-10-CM | POA: Diagnosis not present

## 2018-10-10 DIAGNOSIS — E1122 Type 2 diabetes mellitus with diabetic chronic kidney disease: Secondary | ICD-10-CM | POA: Diagnosis not present

## 2018-10-10 DIAGNOSIS — N186 End stage renal disease: Secondary | ICD-10-CM | POA: Diagnosis not present

## 2018-10-10 DIAGNOSIS — I4891 Unspecified atrial fibrillation: Secondary | ICD-10-CM | POA: Diagnosis not present

## 2018-10-10 DIAGNOSIS — Z6836 Body mass index (BMI) 36.0-36.9, adult: Secondary | ICD-10-CM | POA: Diagnosis not present

## 2018-10-10 DIAGNOSIS — K219 Gastro-esophageal reflux disease without esophagitis: Secondary | ICD-10-CM | POA: Diagnosis not present

## 2018-10-11 DIAGNOSIS — E1142 Type 2 diabetes mellitus with diabetic polyneuropathy: Secondary | ICD-10-CM | POA: Diagnosis not present

## 2018-10-11 DIAGNOSIS — U071 COVID-19: Secondary | ICD-10-CM | POA: Diagnosis not present

## 2018-10-11 DIAGNOSIS — I4811 Longstanding persistent atrial fibrillation: Secondary | ICD-10-CM | POA: Diagnosis not present

## 2018-10-11 DIAGNOSIS — R269 Unspecified abnormalities of gait and mobility: Secondary | ICD-10-CM | POA: Diagnosis not present

## 2018-10-11 DIAGNOSIS — R197 Diarrhea, unspecified: Secondary | ICD-10-CM | POA: Diagnosis not present

## 2018-10-11 DIAGNOSIS — N189 Chronic kidney disease, unspecified: Secondary | ICD-10-CM | POA: Diagnosis not present

## 2018-10-11 DIAGNOSIS — R262 Difficulty in walking, not elsewhere classified: Secondary | ICD-10-CM | POA: Diagnosis not present

## 2018-10-11 DIAGNOSIS — E7800 Pure hypercholesterolemia, unspecified: Secondary | ICD-10-CM | POA: Diagnosis not present

## 2018-10-11 DIAGNOSIS — I4892 Unspecified atrial flutter: Secondary | ICD-10-CM | POA: Diagnosis not present

## 2018-10-11 DIAGNOSIS — E0800 Diabetes mellitus due to underlying condition with hyperosmolarity without nonketotic hyperglycemic-hyperosmolar coma (NKHHC): Secondary | ICD-10-CM | POA: Diagnosis not present

## 2018-10-11 DIAGNOSIS — R2689 Other abnormalities of gait and mobility: Secondary | ICD-10-CM | POA: Diagnosis not present

## 2018-10-11 DIAGNOSIS — R4182 Altered mental status, unspecified: Secondary | ICD-10-CM | POA: Diagnosis not present

## 2018-10-11 DIAGNOSIS — Z794 Long term (current) use of insulin: Secondary | ICD-10-CM | POA: Diagnosis not present

## 2018-10-11 DIAGNOSIS — R293 Abnormal posture: Secondary | ICD-10-CM | POA: Diagnosis not present

## 2018-10-11 DIAGNOSIS — I120 Hypertensive chronic kidney disease with stage 5 chronic kidney disease or end stage renal disease: Secondary | ICD-10-CM | POA: Diagnosis not present

## 2018-10-11 DIAGNOSIS — M549 Dorsalgia, unspecified: Secondary | ICD-10-CM | POA: Diagnosis not present

## 2018-10-11 DIAGNOSIS — N2581 Secondary hyperparathyroidism of renal origin: Secondary | ICD-10-CM | POA: Diagnosis not present

## 2018-10-11 DIAGNOSIS — E1122 Type 2 diabetes mellitus with diabetic chronic kidney disease: Secondary | ICD-10-CM | POA: Diagnosis not present

## 2018-10-11 DIAGNOSIS — Z992 Dependence on renal dialysis: Secondary | ICD-10-CM | POA: Diagnosis not present

## 2018-10-11 DIAGNOSIS — R5381 Other malaise: Secondary | ICD-10-CM | POA: Diagnosis not present

## 2018-10-11 DIAGNOSIS — R109 Unspecified abdominal pain: Secondary | ICD-10-CM | POA: Diagnosis not present

## 2018-10-11 DIAGNOSIS — Z8619 Personal history of other infectious and parasitic diseases: Secondary | ICD-10-CM | POA: Diagnosis not present

## 2018-10-11 DIAGNOSIS — K219 Gastro-esophageal reflux disease without esophagitis: Secondary | ICD-10-CM | POA: Diagnosis not present

## 2018-10-11 DIAGNOSIS — R52 Pain, unspecified: Secondary | ICD-10-CM | POA: Diagnosis not present

## 2018-10-11 DIAGNOSIS — D631 Anemia in chronic kidney disease: Secondary | ICD-10-CM | POA: Diagnosis not present

## 2018-10-11 DIAGNOSIS — N186 End stage renal disease: Secondary | ICD-10-CM | POA: Diagnosis not present

## 2018-10-11 DIAGNOSIS — M6281 Muscle weakness (generalized): Secondary | ICD-10-CM | POA: Diagnosis not present

## 2018-10-11 DIAGNOSIS — Z6836 Body mass index (BMI) 36.0-36.9, adult: Secondary | ICD-10-CM | POA: Diagnosis not present

## 2018-10-11 DIAGNOSIS — K5900 Constipation, unspecified: Secondary | ICD-10-CM | POA: Diagnosis not present

## 2018-10-11 DIAGNOSIS — D509 Iron deficiency anemia, unspecified: Secondary | ICD-10-CM | POA: Diagnosis not present

## 2018-10-11 DIAGNOSIS — N181 Chronic kidney disease, stage 1: Secondary | ICD-10-CM | POA: Diagnosis not present

## 2018-10-11 DIAGNOSIS — E669 Obesity, unspecified: Secondary | ICD-10-CM | POA: Diagnosis not present

## 2018-10-11 DIAGNOSIS — E1159 Type 2 diabetes mellitus with other circulatory complications: Secondary | ICD-10-CM | POA: Diagnosis not present

## 2018-10-11 DIAGNOSIS — E119 Type 2 diabetes mellitus without complications: Secondary | ICD-10-CM | POA: Diagnosis not present

## 2018-10-11 DIAGNOSIS — R251 Tremor, unspecified: Secondary | ICD-10-CM | POA: Diagnosis not present

## 2018-10-11 DIAGNOSIS — M79604 Pain in right leg: Secondary | ICD-10-CM | POA: Diagnosis not present

## 2018-10-11 DIAGNOSIS — I959 Hypotension, unspecified: Secondary | ICD-10-CM | POA: Diagnosis not present

## 2018-10-11 DIAGNOSIS — R110 Nausea: Secondary | ICD-10-CM | POA: Diagnosis not present

## 2018-10-11 DIAGNOSIS — E039 Hypothyroidism, unspecified: Secondary | ICD-10-CM | POA: Diagnosis not present

## 2018-10-11 MED ORDER — HEPARIN SODIUM (PORCINE) 1000 UNIT/ML IJ SOLN
2000.00 | INTRAMUSCULAR | Status: DC
Start: ? — End: 2018-10-11

## 2018-10-11 MED ORDER — CALCITRIOL 0.5 MCG PO CAPS
.50 | ORAL_CAPSULE | ORAL | Status: DC
Start: ? — End: 2018-10-11

## 2018-10-11 MED ORDER — APIXABAN 5 MG PO TABS
5.00 | ORAL_TABLET | ORAL | Status: DC
Start: 2018-10-11 — End: 2018-10-11

## 2018-10-11 MED ORDER — FLUOXETINE HCL 20 MG PO CAPS
20.00 | ORAL_CAPSULE | ORAL | Status: DC
Start: 2018-10-12 — End: 2018-10-11

## 2018-10-11 MED ORDER — GABAPENTIN 100 MG PO CAPS
100.00 | ORAL_CAPSULE | ORAL | Status: DC
Start: 2018-10-11 — End: 2018-10-11

## 2018-10-11 MED ORDER — GENERIC EXTERNAL MEDICATION
2.00 | Status: DC
Start: 2018-10-11 — End: 2018-10-11

## 2018-10-11 MED ORDER — EPOETIN ALFA-EPBX 2000 UNIT/ML IJ SOLN
1000.00 | INTRAMUSCULAR | Status: DC
Start: ? — End: 2018-10-11

## 2018-10-11 MED ORDER — PANTOPRAZOLE SODIUM 40 MG PO TBEC
40.00 | DELAYED_RELEASE_TABLET | ORAL | Status: DC
Start: 2018-10-11 — End: 2018-10-11

## 2018-10-11 MED ORDER — GENERIC EXTERNAL MEDICATION
75.00 | Status: DC
Start: 2018-10-12 — End: 2018-10-11

## 2018-10-11 MED ORDER — DEXTROSE 50 % IV SOLN
12.50 | INTRAVENOUS | Status: DC
Start: ? — End: 2018-10-11

## 2018-10-11 MED ORDER — MIDODRINE HCL 10 MG PO TABS
10.00 | ORAL_TABLET | ORAL | Status: DC
Start: 2018-10-11 — End: 2018-10-11

## 2018-10-11 MED ORDER — AMIODARONE HCL 200 MG PO TABS
200.00 | ORAL_TABLET | ORAL | Status: DC
Start: 2018-10-12 — End: 2018-10-11

## 2018-10-11 MED ORDER — INSULIN GLARGINE 100 UNIT/ML ~~LOC~~ SOLN
5.00 | SUBCUTANEOUS | Status: DC
Start: 2018-10-11 — End: 2018-10-11

## 2018-10-11 MED ORDER — ASPIRIN 81 MG PO CHEW
81.00 | CHEWABLE_TABLET | ORAL | Status: DC
Start: 2018-10-12 — End: 2018-10-11

## 2018-10-11 MED ORDER — INSULIN LISPRO 100 UNIT/ML ~~LOC~~ SOLN
0.00 | SUBCUTANEOUS | Status: DC
Start: 2018-10-11 — End: 2018-10-11

## 2018-10-11 MED ORDER — ATORVASTATIN CALCIUM 80 MG PO TABS
80.00 | ORAL_TABLET | ORAL | Status: DC
Start: 2018-10-11 — End: 2018-10-11

## 2018-10-11 MED ORDER — ONDANSETRON 4 MG PO TBDP
4.00 | ORAL_TABLET | ORAL | Status: DC
Start: ? — End: 2018-10-11

## 2018-10-11 MED ORDER — POLYETHYLENE GLYCOL 3350 17 G PO PACK
17.00 | PACK | ORAL | Status: DC
Start: 2018-10-11 — End: 2018-10-11

## 2018-10-11 MED ORDER — ALBUMIN HUMAN 25 % IV SOLN
25.00 | INTRAVENOUS | Status: DC
Start: ? — End: 2018-10-11

## 2018-10-11 MED ORDER — BISACODYL 10 MG RE SUPP
10.00 | RECTAL | Status: DC
Start: ? — End: 2018-10-11

## 2018-10-13 DIAGNOSIS — D509 Iron deficiency anemia, unspecified: Secondary | ICD-10-CM | POA: Diagnosis not present

## 2018-10-13 DIAGNOSIS — Z992 Dependence on renal dialysis: Secondary | ICD-10-CM | POA: Diagnosis not present

## 2018-10-13 DIAGNOSIS — I959 Hypotension, unspecified: Secondary | ICD-10-CM | POA: Diagnosis not present

## 2018-10-13 DIAGNOSIS — K5900 Constipation, unspecified: Secondary | ICD-10-CM | POA: Diagnosis not present

## 2018-10-13 DIAGNOSIS — R251 Tremor, unspecified: Secondary | ICD-10-CM | POA: Diagnosis not present

## 2018-10-13 DIAGNOSIS — N186 End stage renal disease: Secondary | ICD-10-CM | POA: Diagnosis not present

## 2018-10-13 DIAGNOSIS — N189 Chronic kidney disease, unspecified: Secondary | ICD-10-CM | POA: Diagnosis not present

## 2018-10-13 DIAGNOSIS — D631 Anemia in chronic kidney disease: Secondary | ICD-10-CM | POA: Diagnosis not present

## 2018-10-13 DIAGNOSIS — E119 Type 2 diabetes mellitus without complications: Secondary | ICD-10-CM | POA: Diagnosis not present

## 2018-10-13 DIAGNOSIS — N2581 Secondary hyperparathyroidism of renal origin: Secondary | ICD-10-CM | POA: Diagnosis not present

## 2018-10-13 DIAGNOSIS — U071 COVID-19: Secondary | ICD-10-CM | POA: Diagnosis not present

## 2018-10-13 MED ORDER — ATORVASTATIN CALCIUM 80 MG PO TABS
80.00 | ORAL_TABLET | ORAL | Status: DC
Start: 2018-10-11 — End: 2018-10-13

## 2018-10-13 MED ORDER — ASPIRIN 81 MG PO CHEW
81.00 | CHEWABLE_TABLET | ORAL | Status: DC
Start: 2018-10-12 — End: 2018-10-13

## 2018-10-13 MED ORDER — GENERIC EXTERNAL MEDICATION
75.00 | Status: DC
Start: 2018-10-12 — End: 2018-10-13

## 2018-10-13 MED ORDER — INSULIN LISPRO 100 UNIT/ML ~~LOC~~ SOLN
0.00 | SUBCUTANEOUS | Status: DC
Start: 2018-10-11 — End: 2018-10-13

## 2018-10-13 MED ORDER — PANTOPRAZOLE SODIUM 40 MG PO TBEC
40.00 | DELAYED_RELEASE_TABLET | ORAL | Status: DC
Start: 2018-10-11 — End: 2018-10-13

## 2018-10-13 MED ORDER — DEXTROSE 50 % IV SOLN
12.50 | INTRAVENOUS | Status: DC
Start: ? — End: 2018-10-13

## 2018-10-15 DIAGNOSIS — D509 Iron deficiency anemia, unspecified: Secondary | ICD-10-CM | POA: Diagnosis not present

## 2018-10-15 DIAGNOSIS — N2581 Secondary hyperparathyroidism of renal origin: Secondary | ICD-10-CM | POA: Diagnosis not present

## 2018-10-15 DIAGNOSIS — Z992 Dependence on renal dialysis: Secondary | ICD-10-CM | POA: Diagnosis not present

## 2018-10-15 DIAGNOSIS — E119 Type 2 diabetes mellitus without complications: Secondary | ICD-10-CM | POA: Diagnosis not present

## 2018-10-15 DIAGNOSIS — N186 End stage renal disease: Secondary | ICD-10-CM | POA: Diagnosis not present

## 2018-10-15 DIAGNOSIS — D631 Anemia in chronic kidney disease: Secondary | ICD-10-CM | POA: Diagnosis not present

## 2018-10-17 DIAGNOSIS — D509 Iron deficiency anemia, unspecified: Secondary | ICD-10-CM | POA: Diagnosis not present

## 2018-10-17 DIAGNOSIS — Z992 Dependence on renal dialysis: Secondary | ICD-10-CM | POA: Diagnosis not present

## 2018-10-17 DIAGNOSIS — N186 End stage renal disease: Secondary | ICD-10-CM | POA: Diagnosis not present

## 2018-10-17 DIAGNOSIS — E119 Type 2 diabetes mellitus without complications: Secondary | ICD-10-CM | POA: Diagnosis not present

## 2018-10-17 DIAGNOSIS — N2581 Secondary hyperparathyroidism of renal origin: Secondary | ICD-10-CM | POA: Diagnosis not present

## 2018-10-17 DIAGNOSIS — D631 Anemia in chronic kidney disease: Secondary | ICD-10-CM | POA: Diagnosis not present

## 2018-10-20 DIAGNOSIS — N186 End stage renal disease: Secondary | ICD-10-CM | POA: Diagnosis not present

## 2018-10-20 DIAGNOSIS — Z992 Dependence on renal dialysis: Secondary | ICD-10-CM | POA: Diagnosis not present

## 2018-10-20 DIAGNOSIS — N2581 Secondary hyperparathyroidism of renal origin: Secondary | ICD-10-CM | POA: Diagnosis not present

## 2018-10-20 DIAGNOSIS — E119 Type 2 diabetes mellitus without complications: Secondary | ICD-10-CM | POA: Diagnosis not present

## 2018-10-20 DIAGNOSIS — D631 Anemia in chronic kidney disease: Secondary | ICD-10-CM | POA: Diagnosis not present

## 2018-10-20 DIAGNOSIS — D509 Iron deficiency anemia, unspecified: Secondary | ICD-10-CM | POA: Diagnosis not present

## 2018-10-21 DIAGNOSIS — R251 Tremor, unspecified: Secondary | ICD-10-CM | POA: Diagnosis not present

## 2018-10-21 DIAGNOSIS — K5900 Constipation, unspecified: Secondary | ICD-10-CM | POA: Diagnosis not present

## 2018-10-21 DIAGNOSIS — U071 COVID-19: Secondary | ICD-10-CM | POA: Diagnosis not present

## 2018-10-21 DIAGNOSIS — N189 Chronic kidney disease, unspecified: Secondary | ICD-10-CM | POA: Diagnosis not present

## 2018-10-21 DIAGNOSIS — I959 Hypotension, unspecified: Secondary | ICD-10-CM | POA: Diagnosis not present

## 2018-10-22 DIAGNOSIS — N2581 Secondary hyperparathyroidism of renal origin: Secondary | ICD-10-CM | POA: Diagnosis not present

## 2018-10-22 DIAGNOSIS — N186 End stage renal disease: Secondary | ICD-10-CM | POA: Diagnosis not present

## 2018-10-22 DIAGNOSIS — D509 Iron deficiency anemia, unspecified: Secondary | ICD-10-CM | POA: Diagnosis not present

## 2018-10-22 DIAGNOSIS — D631 Anemia in chronic kidney disease: Secondary | ICD-10-CM | POA: Diagnosis not present

## 2018-10-22 DIAGNOSIS — Z992 Dependence on renal dialysis: Secondary | ICD-10-CM | POA: Diagnosis not present

## 2018-10-22 DIAGNOSIS — E119 Type 2 diabetes mellitus without complications: Secondary | ICD-10-CM | POA: Diagnosis not present

## 2018-10-24 DIAGNOSIS — N2581 Secondary hyperparathyroidism of renal origin: Secondary | ICD-10-CM | POA: Diagnosis not present

## 2018-10-24 DIAGNOSIS — Z992 Dependence on renal dialysis: Secondary | ICD-10-CM | POA: Diagnosis not present

## 2018-10-24 DIAGNOSIS — N186 End stage renal disease: Secondary | ICD-10-CM | POA: Diagnosis not present

## 2018-10-24 DIAGNOSIS — D631 Anemia in chronic kidney disease: Secondary | ICD-10-CM | POA: Diagnosis not present

## 2018-10-24 DIAGNOSIS — D509 Iron deficiency anemia, unspecified: Secondary | ICD-10-CM | POA: Diagnosis not present

## 2018-10-24 DIAGNOSIS — E119 Type 2 diabetes mellitus without complications: Secondary | ICD-10-CM | POA: Diagnosis not present

## 2018-10-29 DIAGNOSIS — N186 End stage renal disease: Secondary | ICD-10-CM | POA: Diagnosis not present

## 2018-10-29 DIAGNOSIS — Z992 Dependence on renal dialysis: Secondary | ICD-10-CM | POA: Diagnosis not present

## 2018-10-29 DIAGNOSIS — D509 Iron deficiency anemia, unspecified: Secondary | ICD-10-CM | POA: Diagnosis not present

## 2018-10-29 DIAGNOSIS — E119 Type 2 diabetes mellitus without complications: Secondary | ICD-10-CM | POA: Diagnosis not present

## 2018-10-29 DIAGNOSIS — D631 Anemia in chronic kidney disease: Secondary | ICD-10-CM | POA: Diagnosis not present

## 2018-10-29 DIAGNOSIS — N2581 Secondary hyperparathyroidism of renal origin: Secondary | ICD-10-CM | POA: Diagnosis not present

## 2018-10-31 DIAGNOSIS — D631 Anemia in chronic kidney disease: Secondary | ICD-10-CM | POA: Diagnosis not present

## 2018-10-31 DIAGNOSIS — D509 Iron deficiency anemia, unspecified: Secondary | ICD-10-CM | POA: Diagnosis not present

## 2018-10-31 DIAGNOSIS — Z992 Dependence on renal dialysis: Secondary | ICD-10-CM | POA: Diagnosis not present

## 2018-10-31 DIAGNOSIS — N2581 Secondary hyperparathyroidism of renal origin: Secondary | ICD-10-CM | POA: Diagnosis not present

## 2018-10-31 DIAGNOSIS — E119 Type 2 diabetes mellitus without complications: Secondary | ICD-10-CM | POA: Diagnosis not present

## 2018-10-31 DIAGNOSIS — N186 End stage renal disease: Secondary | ICD-10-CM | POA: Diagnosis not present

## 2018-11-03 DIAGNOSIS — N2581 Secondary hyperparathyroidism of renal origin: Secondary | ICD-10-CM | POA: Diagnosis not present

## 2018-11-03 DIAGNOSIS — D509 Iron deficiency anemia, unspecified: Secondary | ICD-10-CM | POA: Diagnosis not present

## 2018-11-03 DIAGNOSIS — Z992 Dependence on renal dialysis: Secondary | ICD-10-CM | POA: Diagnosis not present

## 2018-11-03 DIAGNOSIS — D631 Anemia in chronic kidney disease: Secondary | ICD-10-CM | POA: Diagnosis not present

## 2018-11-03 DIAGNOSIS — E119 Type 2 diabetes mellitus without complications: Secondary | ICD-10-CM | POA: Diagnosis not present

## 2018-11-03 DIAGNOSIS — N186 End stage renal disease: Secondary | ICD-10-CM | POA: Diagnosis not present

## 2018-11-04 DIAGNOSIS — N186 End stage renal disease: Secondary | ICD-10-CM | POA: Diagnosis not present

## 2018-11-05 DIAGNOSIS — E119 Type 2 diabetes mellitus without complications: Secondary | ICD-10-CM | POA: Diagnosis not present

## 2018-11-05 DIAGNOSIS — N2581 Secondary hyperparathyroidism of renal origin: Secondary | ICD-10-CM | POA: Diagnosis not present

## 2018-11-05 DIAGNOSIS — N186 End stage renal disease: Secondary | ICD-10-CM | POA: Diagnosis not present

## 2018-11-05 DIAGNOSIS — D509 Iron deficiency anemia, unspecified: Secondary | ICD-10-CM | POA: Diagnosis not present

## 2018-11-05 DIAGNOSIS — Z992 Dependence on renal dialysis: Secondary | ICD-10-CM | POA: Diagnosis not present

## 2018-11-05 DIAGNOSIS — D631 Anemia in chronic kidney disease: Secondary | ICD-10-CM | POA: Diagnosis not present

## 2018-11-06 DIAGNOSIS — E669 Obesity, unspecified: Secondary | ICD-10-CM | POA: Diagnosis not present

## 2018-11-06 DIAGNOSIS — M79604 Pain in right leg: Secondary | ICD-10-CM | POA: Diagnosis not present

## 2018-11-06 DIAGNOSIS — K219 Gastro-esophageal reflux disease without esophagitis: Secondary | ICD-10-CM | POA: Diagnosis not present

## 2018-11-06 DIAGNOSIS — I120 Hypertensive chronic kidney disease with stage 5 chronic kidney disease or end stage renal disease: Secondary | ICD-10-CM | POA: Diagnosis not present

## 2018-11-06 DIAGNOSIS — R110 Nausea: Secondary | ICD-10-CM | POA: Diagnosis not present

## 2018-11-06 DIAGNOSIS — N186 End stage renal disease: Secondary | ICD-10-CM | POA: Diagnosis not present

## 2018-11-06 DIAGNOSIS — E1122 Type 2 diabetes mellitus with diabetic chronic kidney disease: Secondary | ICD-10-CM | POA: Diagnosis not present

## 2018-11-06 DIAGNOSIS — R4182 Altered mental status, unspecified: Secondary | ICD-10-CM | POA: Diagnosis not present

## 2018-11-07 DIAGNOSIS — N2581 Secondary hyperparathyroidism of renal origin: Secondary | ICD-10-CM | POA: Diagnosis not present

## 2018-11-07 DIAGNOSIS — D509 Iron deficiency anemia, unspecified: Secondary | ICD-10-CM | POA: Diagnosis not present

## 2018-11-07 DIAGNOSIS — D631 Anemia in chronic kidney disease: Secondary | ICD-10-CM | POA: Diagnosis not present

## 2018-11-07 DIAGNOSIS — E119 Type 2 diabetes mellitus without complications: Secondary | ICD-10-CM | POA: Diagnosis not present

## 2018-11-07 DIAGNOSIS — Z992 Dependence on renal dialysis: Secondary | ICD-10-CM | POA: Diagnosis not present

## 2018-11-07 DIAGNOSIS — N186 End stage renal disease: Secondary | ICD-10-CM | POA: Diagnosis not present

## 2018-11-10 DIAGNOSIS — N186 End stage renal disease: Secondary | ICD-10-CM | POA: Diagnosis not present

## 2018-11-10 DIAGNOSIS — D631 Anemia in chronic kidney disease: Secondary | ICD-10-CM | POA: Diagnosis not present

## 2018-11-10 DIAGNOSIS — E119 Type 2 diabetes mellitus without complications: Secondary | ICD-10-CM | POA: Diagnosis not present

## 2018-11-10 DIAGNOSIS — Z992 Dependence on renal dialysis: Secondary | ICD-10-CM | POA: Diagnosis not present

## 2018-11-10 DIAGNOSIS — D509 Iron deficiency anemia, unspecified: Secondary | ICD-10-CM | POA: Diagnosis not present

## 2018-11-10 DIAGNOSIS — N2581 Secondary hyperparathyroidism of renal origin: Secondary | ICD-10-CM | POA: Diagnosis not present

## 2018-11-11 DIAGNOSIS — R4182 Altered mental status, unspecified: Secondary | ICD-10-CM | POA: Diagnosis not present

## 2018-11-11 DIAGNOSIS — E669 Obesity, unspecified: Secondary | ICD-10-CM | POA: Diagnosis not present

## 2018-11-11 DIAGNOSIS — M79604 Pain in right leg: Secondary | ICD-10-CM | POA: Diagnosis not present

## 2018-11-11 DIAGNOSIS — R110 Nausea: Secondary | ICD-10-CM | POA: Diagnosis not present

## 2018-11-11 DIAGNOSIS — N186 End stage renal disease: Secondary | ICD-10-CM | POA: Diagnosis not present

## 2018-11-11 DIAGNOSIS — K219 Gastro-esophageal reflux disease without esophagitis: Secondary | ICD-10-CM | POA: Diagnosis not present

## 2018-11-11 DIAGNOSIS — I120 Hypertensive chronic kidney disease with stage 5 chronic kidney disease or end stage renal disease: Secondary | ICD-10-CM | POA: Diagnosis not present

## 2018-11-11 DIAGNOSIS — E1122 Type 2 diabetes mellitus with diabetic chronic kidney disease: Secondary | ICD-10-CM | POA: Diagnosis not present

## 2018-11-12 DIAGNOSIS — N2581 Secondary hyperparathyroidism of renal origin: Secondary | ICD-10-CM | POA: Diagnosis not present

## 2018-11-12 DIAGNOSIS — Z992 Dependence on renal dialysis: Secondary | ICD-10-CM | POA: Diagnosis not present

## 2018-11-12 DIAGNOSIS — E119 Type 2 diabetes mellitus without complications: Secondary | ICD-10-CM | POA: Diagnosis not present

## 2018-11-12 DIAGNOSIS — D631 Anemia in chronic kidney disease: Secondary | ICD-10-CM | POA: Diagnosis not present

## 2018-11-12 DIAGNOSIS — D509 Iron deficiency anemia, unspecified: Secondary | ICD-10-CM | POA: Diagnosis not present

## 2018-11-12 DIAGNOSIS — N186 End stage renal disease: Secondary | ICD-10-CM | POA: Diagnosis not present

## 2018-11-14 DIAGNOSIS — D631 Anemia in chronic kidney disease: Secondary | ICD-10-CM | POA: Diagnosis not present

## 2018-11-14 DIAGNOSIS — Z992 Dependence on renal dialysis: Secondary | ICD-10-CM | POA: Diagnosis not present

## 2018-11-14 DIAGNOSIS — N2581 Secondary hyperparathyroidism of renal origin: Secondary | ICD-10-CM | POA: Diagnosis not present

## 2018-11-14 DIAGNOSIS — E119 Type 2 diabetes mellitus without complications: Secondary | ICD-10-CM | POA: Diagnosis not present

## 2018-11-14 DIAGNOSIS — D509 Iron deficiency anemia, unspecified: Secondary | ICD-10-CM | POA: Diagnosis not present

## 2018-11-14 DIAGNOSIS — N186 End stage renal disease: Secondary | ICD-10-CM | POA: Diagnosis not present

## 2018-11-17 DIAGNOSIS — Z992 Dependence on renal dialysis: Secondary | ICD-10-CM | POA: Diagnosis not present

## 2018-11-17 DIAGNOSIS — D631 Anemia in chronic kidney disease: Secondary | ICD-10-CM | POA: Diagnosis not present

## 2018-11-17 DIAGNOSIS — N186 End stage renal disease: Secondary | ICD-10-CM | POA: Diagnosis not present

## 2018-11-17 DIAGNOSIS — N2581 Secondary hyperparathyroidism of renal origin: Secondary | ICD-10-CM | POA: Diagnosis not present

## 2018-11-17 DIAGNOSIS — D509 Iron deficiency anemia, unspecified: Secondary | ICD-10-CM | POA: Diagnosis not present

## 2018-11-17 DIAGNOSIS — E119 Type 2 diabetes mellitus without complications: Secondary | ICD-10-CM | POA: Diagnosis not present

## 2018-11-19 DIAGNOSIS — D631 Anemia in chronic kidney disease: Secondary | ICD-10-CM | POA: Diagnosis not present

## 2018-11-19 DIAGNOSIS — N2581 Secondary hyperparathyroidism of renal origin: Secondary | ICD-10-CM | POA: Diagnosis not present

## 2018-11-19 DIAGNOSIS — E119 Type 2 diabetes mellitus without complications: Secondary | ICD-10-CM | POA: Diagnosis not present

## 2018-11-19 DIAGNOSIS — D509 Iron deficiency anemia, unspecified: Secondary | ICD-10-CM | POA: Diagnosis not present

## 2018-11-19 DIAGNOSIS — Z992 Dependence on renal dialysis: Secondary | ICD-10-CM | POA: Diagnosis not present

## 2018-11-19 DIAGNOSIS — N186 End stage renal disease: Secondary | ICD-10-CM | POA: Diagnosis not present

## 2018-11-21 DIAGNOSIS — D509 Iron deficiency anemia, unspecified: Secondary | ICD-10-CM | POA: Diagnosis not present

## 2018-11-21 DIAGNOSIS — E119 Type 2 diabetes mellitus without complications: Secondary | ICD-10-CM | POA: Diagnosis not present

## 2018-11-21 DIAGNOSIS — Z992 Dependence on renal dialysis: Secondary | ICD-10-CM | POA: Diagnosis not present

## 2018-11-21 DIAGNOSIS — N186 End stage renal disease: Secondary | ICD-10-CM | POA: Diagnosis not present

## 2018-11-21 DIAGNOSIS — N2581 Secondary hyperparathyroidism of renal origin: Secondary | ICD-10-CM | POA: Diagnosis not present

## 2018-11-21 DIAGNOSIS — D631 Anemia in chronic kidney disease: Secondary | ICD-10-CM | POA: Diagnosis not present

## 2018-11-26 DIAGNOSIS — N2581 Secondary hyperparathyroidism of renal origin: Secondary | ICD-10-CM | POA: Diagnosis not present

## 2018-11-26 DIAGNOSIS — Z992 Dependence on renal dialysis: Secondary | ICD-10-CM | POA: Diagnosis not present

## 2018-11-26 DIAGNOSIS — E119 Type 2 diabetes mellitus without complications: Secondary | ICD-10-CM | POA: Diagnosis not present

## 2018-11-26 DIAGNOSIS — D631 Anemia in chronic kidney disease: Secondary | ICD-10-CM | POA: Diagnosis not present

## 2018-11-26 DIAGNOSIS — N186 End stage renal disease: Secondary | ICD-10-CM | POA: Diagnosis not present

## 2018-11-26 DIAGNOSIS — D509 Iron deficiency anemia, unspecified: Secondary | ICD-10-CM | POA: Diagnosis not present

## 2018-11-28 DIAGNOSIS — D631 Anemia in chronic kidney disease: Secondary | ICD-10-CM | POA: Diagnosis not present

## 2018-11-28 DIAGNOSIS — E119 Type 2 diabetes mellitus without complications: Secondary | ICD-10-CM | POA: Diagnosis not present

## 2018-11-28 DIAGNOSIS — Z992 Dependence on renal dialysis: Secondary | ICD-10-CM | POA: Diagnosis not present

## 2018-11-28 DIAGNOSIS — N186 End stage renal disease: Secondary | ICD-10-CM | POA: Diagnosis not present

## 2018-11-28 DIAGNOSIS — N2581 Secondary hyperparathyroidism of renal origin: Secondary | ICD-10-CM | POA: Diagnosis not present

## 2018-11-28 DIAGNOSIS — D509 Iron deficiency anemia, unspecified: Secondary | ICD-10-CM | POA: Diagnosis not present

## 2018-12-02 DIAGNOSIS — N186 End stage renal disease: Secondary | ICD-10-CM | POA: Diagnosis not present

## 2018-12-02 DIAGNOSIS — E669 Obesity, unspecified: Secondary | ICD-10-CM | POA: Diagnosis not present

## 2018-12-02 DIAGNOSIS — I120 Hypertensive chronic kidney disease with stage 5 chronic kidney disease or end stage renal disease: Secondary | ICD-10-CM | POA: Diagnosis not present

## 2018-12-02 DIAGNOSIS — K219 Gastro-esophageal reflux disease without esophagitis: Secondary | ICD-10-CM | POA: Diagnosis not present

## 2018-12-02 DIAGNOSIS — E1122 Type 2 diabetes mellitus with diabetic chronic kidney disease: Secondary | ICD-10-CM | POA: Diagnosis not present

## 2018-12-02 DIAGNOSIS — R110 Nausea: Secondary | ICD-10-CM | POA: Diagnosis not present

## 2018-12-02 DIAGNOSIS — R4182 Altered mental status, unspecified: Secondary | ICD-10-CM | POA: Diagnosis not present

## 2018-12-02 DIAGNOSIS — M79604 Pain in right leg: Secondary | ICD-10-CM | POA: Diagnosis not present

## 2018-12-03 DIAGNOSIS — E669 Obesity, unspecified: Secondary | ICD-10-CM | POA: Diagnosis not present

## 2018-12-03 DIAGNOSIS — E1122 Type 2 diabetes mellitus with diabetic chronic kidney disease: Secondary | ICD-10-CM | POA: Diagnosis not present

## 2018-12-03 DIAGNOSIS — I120 Hypertensive chronic kidney disease with stage 5 chronic kidney disease or end stage renal disease: Secondary | ICD-10-CM | POA: Diagnosis not present

## 2018-12-03 DIAGNOSIS — N2581 Secondary hyperparathyroidism of renal origin: Secondary | ICD-10-CM | POA: Diagnosis not present

## 2018-12-03 DIAGNOSIS — K219 Gastro-esophageal reflux disease without esophagitis: Secondary | ICD-10-CM | POA: Diagnosis not present

## 2018-12-03 DIAGNOSIS — M79604 Pain in right leg: Secondary | ICD-10-CM | POA: Diagnosis not present

## 2018-12-03 DIAGNOSIS — D509 Iron deficiency anemia, unspecified: Secondary | ICD-10-CM | POA: Diagnosis not present

## 2018-12-03 DIAGNOSIS — Z992 Dependence on renal dialysis: Secondary | ICD-10-CM | POA: Diagnosis not present

## 2018-12-03 DIAGNOSIS — E119 Type 2 diabetes mellitus without complications: Secondary | ICD-10-CM | POA: Diagnosis not present

## 2018-12-03 DIAGNOSIS — N186 End stage renal disease: Secondary | ICD-10-CM | POA: Diagnosis not present

## 2018-12-03 DIAGNOSIS — R110 Nausea: Secondary | ICD-10-CM | POA: Diagnosis not present

## 2018-12-03 DIAGNOSIS — R4182 Altered mental status, unspecified: Secondary | ICD-10-CM | POA: Diagnosis not present

## 2018-12-03 DIAGNOSIS — D631 Anemia in chronic kidney disease: Secondary | ICD-10-CM | POA: Diagnosis not present

## 2018-12-05 DIAGNOSIS — E119 Type 2 diabetes mellitus without complications: Secondary | ICD-10-CM | POA: Diagnosis not present

## 2018-12-05 DIAGNOSIS — D509 Iron deficiency anemia, unspecified: Secondary | ICD-10-CM | POA: Diagnosis not present

## 2018-12-05 DIAGNOSIS — D631 Anemia in chronic kidney disease: Secondary | ICD-10-CM | POA: Diagnosis not present

## 2018-12-05 DIAGNOSIS — N186 End stage renal disease: Secondary | ICD-10-CM | POA: Diagnosis not present

## 2018-12-05 DIAGNOSIS — Z992 Dependence on renal dialysis: Secondary | ICD-10-CM | POA: Diagnosis not present

## 2018-12-05 DIAGNOSIS — N2581 Secondary hyperparathyroidism of renal origin: Secondary | ICD-10-CM | POA: Diagnosis not present

## 2018-12-08 DIAGNOSIS — D631 Anemia in chronic kidney disease: Secondary | ICD-10-CM | POA: Diagnosis not present

## 2018-12-08 DIAGNOSIS — N186 End stage renal disease: Secondary | ICD-10-CM | POA: Diagnosis not present

## 2018-12-08 DIAGNOSIS — N2581 Secondary hyperparathyroidism of renal origin: Secondary | ICD-10-CM | POA: Diagnosis not present

## 2018-12-08 DIAGNOSIS — Z992 Dependence on renal dialysis: Secondary | ICD-10-CM | POA: Diagnosis not present

## 2018-12-08 DIAGNOSIS — D509 Iron deficiency anemia, unspecified: Secondary | ICD-10-CM | POA: Diagnosis not present

## 2018-12-08 DIAGNOSIS — E119 Type 2 diabetes mellitus without complications: Secondary | ICD-10-CM | POA: Diagnosis not present

## 2018-12-09 DIAGNOSIS — U071 COVID-19: Secondary | ICD-10-CM | POA: Diagnosis not present

## 2018-12-09 DIAGNOSIS — N181 Chronic kidney disease, stage 1: Secondary | ICD-10-CM | POA: Diagnosis not present

## 2018-12-09 DIAGNOSIS — I4811 Longstanding persistent atrial fibrillation: Secondary | ICD-10-CM | POA: Diagnosis not present

## 2018-12-09 DIAGNOSIS — E039 Hypothyroidism, unspecified: Secondary | ICD-10-CM | POA: Diagnosis not present

## 2018-12-10 DIAGNOSIS — D509 Iron deficiency anemia, unspecified: Secondary | ICD-10-CM | POA: Diagnosis not present

## 2018-12-10 DIAGNOSIS — N186 End stage renal disease: Secondary | ICD-10-CM | POA: Diagnosis not present

## 2018-12-10 DIAGNOSIS — N2581 Secondary hyperparathyroidism of renal origin: Secondary | ICD-10-CM | POA: Diagnosis not present

## 2018-12-10 DIAGNOSIS — D631 Anemia in chronic kidney disease: Secondary | ICD-10-CM | POA: Diagnosis not present

## 2018-12-10 DIAGNOSIS — E119 Type 2 diabetes mellitus without complications: Secondary | ICD-10-CM | POA: Diagnosis not present

## 2018-12-10 DIAGNOSIS — Z992 Dependence on renal dialysis: Secondary | ICD-10-CM | POA: Diagnosis not present

## 2018-12-11 DIAGNOSIS — R197 Diarrhea, unspecified: Secondary | ICD-10-CM | POA: Diagnosis not present

## 2018-12-11 DIAGNOSIS — R110 Nausea: Secondary | ICD-10-CM | POA: Diagnosis not present

## 2018-12-12 DIAGNOSIS — N186 End stage renal disease: Secondary | ICD-10-CM | POA: Diagnosis not present

## 2018-12-12 DIAGNOSIS — D631 Anemia in chronic kidney disease: Secondary | ICD-10-CM | POA: Diagnosis not present

## 2018-12-12 DIAGNOSIS — D509 Iron deficiency anemia, unspecified: Secondary | ICD-10-CM | POA: Diagnosis not present

## 2018-12-12 DIAGNOSIS — N2581 Secondary hyperparathyroidism of renal origin: Secondary | ICD-10-CM | POA: Diagnosis not present

## 2018-12-12 DIAGNOSIS — E119 Type 2 diabetes mellitus without complications: Secondary | ICD-10-CM | POA: Diagnosis not present

## 2018-12-12 DIAGNOSIS — Z992 Dependence on renal dialysis: Secondary | ICD-10-CM | POA: Diagnosis not present

## 2018-12-15 DIAGNOSIS — R110 Nausea: Secondary | ICD-10-CM | POA: Diagnosis not present

## 2018-12-15 DIAGNOSIS — R197 Diarrhea, unspecified: Secondary | ICD-10-CM | POA: Diagnosis not present

## 2018-12-17 DIAGNOSIS — Z992 Dependence on renal dialysis: Secondary | ICD-10-CM | POA: Diagnosis not present

## 2018-12-17 DIAGNOSIS — N186 End stage renal disease: Secondary | ICD-10-CM | POA: Diagnosis not present

## 2018-12-17 DIAGNOSIS — D509 Iron deficiency anemia, unspecified: Secondary | ICD-10-CM | POA: Diagnosis not present

## 2018-12-17 DIAGNOSIS — D631 Anemia in chronic kidney disease: Secondary | ICD-10-CM | POA: Diagnosis not present

## 2018-12-17 DIAGNOSIS — N2581 Secondary hyperparathyroidism of renal origin: Secondary | ICD-10-CM | POA: Diagnosis not present

## 2018-12-17 DIAGNOSIS — E119 Type 2 diabetes mellitus without complications: Secondary | ICD-10-CM | POA: Diagnosis not present

## 2018-12-19 DIAGNOSIS — D631 Anemia in chronic kidney disease: Secondary | ICD-10-CM | POA: Diagnosis not present

## 2018-12-19 DIAGNOSIS — N2581 Secondary hyperparathyroidism of renal origin: Secondary | ICD-10-CM | POA: Diagnosis not present

## 2018-12-19 DIAGNOSIS — N186 End stage renal disease: Secondary | ICD-10-CM | POA: Diagnosis not present

## 2018-12-19 DIAGNOSIS — E119 Type 2 diabetes mellitus without complications: Secondary | ICD-10-CM | POA: Diagnosis not present

## 2018-12-19 DIAGNOSIS — D509 Iron deficiency anemia, unspecified: Secondary | ICD-10-CM | POA: Diagnosis not present

## 2018-12-19 DIAGNOSIS — Z992 Dependence on renal dialysis: Secondary | ICD-10-CM | POA: Diagnosis not present

## 2018-12-20 DIAGNOSIS — R269 Unspecified abnormalities of gait and mobility: Secondary | ICD-10-CM | POA: Diagnosis not present

## 2018-12-20 DIAGNOSIS — R5381 Other malaise: Secondary | ICD-10-CM | POA: Diagnosis not present

## 2018-12-20 DIAGNOSIS — R52 Pain, unspecified: Secondary | ICD-10-CM | POA: Diagnosis not present

## 2018-12-20 DIAGNOSIS — M6281 Muscle weakness (generalized): Secondary | ICD-10-CM | POA: Diagnosis not present

## 2018-12-22 DIAGNOSIS — N186 End stage renal disease: Secondary | ICD-10-CM | POA: Diagnosis not present

## 2018-12-22 DIAGNOSIS — E119 Type 2 diabetes mellitus without complications: Secondary | ICD-10-CM | POA: Diagnosis not present

## 2018-12-22 DIAGNOSIS — N2581 Secondary hyperparathyroidism of renal origin: Secondary | ICD-10-CM | POA: Diagnosis not present

## 2018-12-22 DIAGNOSIS — Z992 Dependence on renal dialysis: Secondary | ICD-10-CM | POA: Diagnosis not present

## 2018-12-22 DIAGNOSIS — D631 Anemia in chronic kidney disease: Secondary | ICD-10-CM | POA: Diagnosis not present

## 2018-12-22 DIAGNOSIS — D509 Iron deficiency anemia, unspecified: Secondary | ICD-10-CM | POA: Diagnosis not present

## 2018-12-23 DIAGNOSIS — R110 Nausea: Secondary | ICD-10-CM | POA: Diagnosis not present

## 2018-12-23 DIAGNOSIS — Z992 Dependence on renal dialysis: Secondary | ICD-10-CM | POA: Diagnosis not present

## 2018-12-23 DIAGNOSIS — M549 Dorsalgia, unspecified: Secondary | ICD-10-CM | POA: Diagnosis not present

## 2018-12-23 DIAGNOSIS — N189 Chronic kidney disease, unspecified: Secondary | ICD-10-CM | POA: Diagnosis not present

## 2018-12-23 DIAGNOSIS — R2689 Other abnormalities of gait and mobility: Secondary | ICD-10-CM | POA: Diagnosis not present

## 2018-12-23 DIAGNOSIS — E1122 Type 2 diabetes mellitus with diabetic chronic kidney disease: Secondary | ICD-10-CM | POA: Diagnosis not present

## 2018-12-23 DIAGNOSIS — R197 Diarrhea, unspecified: Secondary | ICD-10-CM | POA: Diagnosis not present

## 2018-12-23 DIAGNOSIS — M6281 Muscle weakness (generalized): Secondary | ICD-10-CM | POA: Diagnosis not present

## 2018-12-24 DIAGNOSIS — D509 Iron deficiency anemia, unspecified: Secondary | ICD-10-CM | POA: Diagnosis not present

## 2018-12-24 DIAGNOSIS — D631 Anemia in chronic kidney disease: Secondary | ICD-10-CM | POA: Diagnosis not present

## 2018-12-24 DIAGNOSIS — N186 End stage renal disease: Secondary | ICD-10-CM | POA: Diagnosis not present

## 2018-12-24 DIAGNOSIS — Z992 Dependence on renal dialysis: Secondary | ICD-10-CM | POA: Diagnosis not present

## 2018-12-24 DIAGNOSIS — N2581 Secondary hyperparathyroidism of renal origin: Secondary | ICD-10-CM | POA: Diagnosis not present

## 2018-12-24 DIAGNOSIS — E119 Type 2 diabetes mellitus without complications: Secondary | ICD-10-CM | POA: Diagnosis not present

## 2018-12-26 DIAGNOSIS — D631 Anemia in chronic kidney disease: Secondary | ICD-10-CM | POA: Diagnosis not present

## 2018-12-26 DIAGNOSIS — E119 Type 2 diabetes mellitus without complications: Secondary | ICD-10-CM | POA: Diagnosis not present

## 2018-12-26 DIAGNOSIS — N186 End stage renal disease: Secondary | ICD-10-CM | POA: Diagnosis not present

## 2018-12-26 DIAGNOSIS — N2581 Secondary hyperparathyroidism of renal origin: Secondary | ICD-10-CM | POA: Diagnosis not present

## 2018-12-26 DIAGNOSIS — Z992 Dependence on renal dialysis: Secondary | ICD-10-CM | POA: Diagnosis not present

## 2018-12-26 DIAGNOSIS — D509 Iron deficiency anemia, unspecified: Secondary | ICD-10-CM | POA: Diagnosis not present

## 2018-12-29 DIAGNOSIS — D631 Anemia in chronic kidney disease: Secondary | ICD-10-CM | POA: Diagnosis not present

## 2018-12-29 DIAGNOSIS — D509 Iron deficiency anemia, unspecified: Secondary | ICD-10-CM | POA: Diagnosis not present

## 2018-12-29 DIAGNOSIS — Z992 Dependence on renal dialysis: Secondary | ICD-10-CM | POA: Diagnosis not present

## 2018-12-29 DIAGNOSIS — E119 Type 2 diabetes mellitus without complications: Secondary | ICD-10-CM | POA: Diagnosis not present

## 2018-12-29 DIAGNOSIS — N186 End stage renal disease: Secondary | ICD-10-CM | POA: Diagnosis not present

## 2018-12-29 DIAGNOSIS — N2581 Secondary hyperparathyroidism of renal origin: Secondary | ICD-10-CM | POA: Diagnosis not present

## 2018-12-31 DIAGNOSIS — D631 Anemia in chronic kidney disease: Secondary | ICD-10-CM | POA: Diagnosis not present

## 2018-12-31 DIAGNOSIS — N2581 Secondary hyperparathyroidism of renal origin: Secondary | ICD-10-CM | POA: Diagnosis not present

## 2018-12-31 DIAGNOSIS — Z992 Dependence on renal dialysis: Secondary | ICD-10-CM | POA: Diagnosis not present

## 2018-12-31 DIAGNOSIS — E119 Type 2 diabetes mellitus without complications: Secondary | ICD-10-CM | POA: Diagnosis not present

## 2018-12-31 DIAGNOSIS — N186 End stage renal disease: Secondary | ICD-10-CM | POA: Diagnosis not present

## 2018-12-31 DIAGNOSIS — D509 Iron deficiency anemia, unspecified: Secondary | ICD-10-CM | POA: Diagnosis not present

## 2019-01-02 DIAGNOSIS — D631 Anemia in chronic kidney disease: Secondary | ICD-10-CM | POA: Diagnosis not present

## 2019-01-02 DIAGNOSIS — Z992 Dependence on renal dialysis: Secondary | ICD-10-CM | POA: Diagnosis not present

## 2019-01-02 DIAGNOSIS — D509 Iron deficiency anemia, unspecified: Secondary | ICD-10-CM | POA: Diagnosis not present

## 2019-01-02 DIAGNOSIS — N186 End stage renal disease: Secondary | ICD-10-CM | POA: Diagnosis not present

## 2019-01-02 DIAGNOSIS — N2581 Secondary hyperparathyroidism of renal origin: Secondary | ICD-10-CM | POA: Diagnosis not present

## 2019-01-02 DIAGNOSIS — E119 Type 2 diabetes mellitus without complications: Secondary | ICD-10-CM | POA: Diagnosis not present

## 2019-01-03 DIAGNOSIS — N186 End stage renal disease: Secondary | ICD-10-CM | POA: Diagnosis not present

## 2019-01-03 DIAGNOSIS — E1122 Type 2 diabetes mellitus with diabetic chronic kidney disease: Secondary | ICD-10-CM | POA: Diagnosis not present

## 2019-01-03 DIAGNOSIS — Z992 Dependence on renal dialysis: Secondary | ICD-10-CM | POA: Diagnosis not present

## 2019-01-04 DIAGNOSIS — Z992 Dependence on renal dialysis: Secondary | ICD-10-CM | POA: Diagnosis not present

## 2019-01-04 DIAGNOSIS — N186 End stage renal disease: Secondary | ICD-10-CM | POA: Diagnosis not present

## 2019-01-04 DIAGNOSIS — E1122 Type 2 diabetes mellitus with diabetic chronic kidney disease: Secondary | ICD-10-CM | POA: Diagnosis not present

## 2019-01-05 DIAGNOSIS — N186 End stage renal disease: Secondary | ICD-10-CM | POA: Diagnosis not present

## 2019-01-05 DIAGNOSIS — D509 Iron deficiency anemia, unspecified: Secondary | ICD-10-CM | POA: Diagnosis not present

## 2019-01-05 DIAGNOSIS — D631 Anemia in chronic kidney disease: Secondary | ICD-10-CM | POA: Diagnosis not present

## 2019-01-05 DIAGNOSIS — N2581 Secondary hyperparathyroidism of renal origin: Secondary | ICD-10-CM | POA: Diagnosis not present

## 2019-01-05 DIAGNOSIS — E119 Type 2 diabetes mellitus without complications: Secondary | ICD-10-CM | POA: Diagnosis not present

## 2019-01-05 DIAGNOSIS — Z992 Dependence on renal dialysis: Secondary | ICD-10-CM | POA: Diagnosis not present

## 2019-01-06 ENCOUNTER — Ambulatory Visit: Payer: Medicare Other | Admitting: Cardiovascular Disease

## 2019-01-06 DIAGNOSIS — I4891 Unspecified atrial fibrillation: Secondary | ICD-10-CM | POA: Diagnosis not present

## 2019-01-06 DIAGNOSIS — M79673 Pain in unspecified foot: Secondary | ICD-10-CM | POA: Diagnosis not present

## 2019-01-06 DIAGNOSIS — N189 Chronic kidney disease, unspecified: Secondary | ICD-10-CM | POA: Diagnosis not present

## 2019-01-06 DIAGNOSIS — M6281 Muscle weakness (generalized): Secondary | ICD-10-CM | POA: Diagnosis not present

## 2019-01-06 DIAGNOSIS — R269 Unspecified abnormalities of gait and mobility: Secondary | ICD-10-CM | POA: Diagnosis not present

## 2019-01-06 NOTE — Progress Notes (Deleted)
Cardiology Office Note   Date:  01/06/2019   ID:  Jamie Arias, DOB 08-Jul-1955, MRN [id number]  PCP:  [name], MD  Cardiologist:   Kathlyn Sacramento, MD   No chief complaint on file.     History of Present Illness: ERSEL Arias is a 63 y.o. female who presents for a followup regarding coronary artery disease status post CABG and chronic diastolic heart failure. She has known history of diabetes, end-stage renal disease on hemodialysis, anemia, hypertension and hyperlipidemia.  She is status post CABG in April 2015.    She was started on dialysis in October 2017.  Dialysis days are Mondays, Wednesdays and Fridays.  Antihypertensive medications were discontinued due to hypotension during dialysis. Ejection fraction was previously reduced but most recent echocardiogram in August 2017 showed normal LV systolic function, mild mitral regurgitation and mild pulmonary hypertension.  The patient has issues with orthostatic hypotension which has been managed with midodrine with partial improvement.  She is also having problems with her balance and she is seeing Dr. Manuella Ghazi in neurology.  She is using the wheelchair most of the time.  She has known history of severe hyperlipidemia and did not respond well to Zetia.  Thus, Praluent was added.    Past Medical History:  Diagnosis Date  . Anemia   . Chronic diastolic CHF (congestive heart failure) ([place])    a. Due to ischemic cardiomyopathy. EF as low as 35%, improved to normal s/p CABG; b. echo 07/06/13: EF 55-60%, no RWMAs, mod TR, trivial pericardial effusion not c/w tamponade physiology;  c. 10/2015 Echo: EF 65%, Gr1 DD, triv AI, mild MR, mildly dil LA, mod TR, PASP 90mHg.  .Marland KitchenCoronary artery disease    a. NSTEMI 06/2013; b.cath: severe three-vessel CAD w/ EF 30% & mild-mod MR; c. s/p 3 vessel CABG 07/02/13 (LIMA-LAD, SVG-OM, and SVG-RPDA);  d. 10/2015 MV: no ischemia/infarct.  . Diabetes mellitus without complication ([place]   . Diabetic  neuropathy ([place]   . Dialysis patient ([facility]    MWF  . ESRD (end stage renal disease) ([place]    a. 12/2015 initiated - mwf dialysis.  .Marland KitchenGERD (gastroesophageal reflux disease)   . Hyperlipidemia   . Hypertension   . Hypothyroidism   . Myocardial infarction ([place] 2015  . Neuropathy   . Pleural effusion 2015  . Pulmonary hypertension ([place]   . Wears dentures    full lower    Past Surgical History:  Procedure Laterality Date  . A/V FISTULAGRAM Right 12/17/2016   Procedure: A/V Fistulagram;  Surgeon: DAlgernon Huxley MD;  [place];  Service: Cardiovascular;  Laterality: Right;  . A/V FISTULAGRAM Right 01/07/2017   Procedure: A/V Fistulagram;  Surgeon: DAlgernon Huxley MD;  [place];  Service: Cardiovascular;  Laterality: Right;  . A/V FISTULAGRAM Right 12/03/2017   Procedure: A/V FISTULAGRAM;  Surgeon: SKatha Cabal MD;  [place];  Service: Cardiovascular;  Laterality: Right;  . A/V SHUNT INTERVENTION N/A 02/22/2017   Procedure: A/V SHUNT INTERVENTION;  Surgeon: DAlgernon Huxley MD;  [place]: [facility];  Service: Cardiovascular;  Laterality: N/A;  . AV FISTULA PLACEMENT Right 02/03/2016   Procedure: INSERTION OF ARTERIOVENOUS (AV) GORE-TEX GRAFT ARM ( BRACH / AXILLARY );  Surgeon: GKatha Cabal MD;  Location: [facility];  Service: Vascular;  Laterality: Right;  . CARDIAC CATHETERIZATION    . CATARACT EXTRACTION Bilateral   . CHOLECYSTECTOMY N/A  12/09/2014   Procedure: LAPAROSCOPIC CHOLECYSTECTOMY;  Surgeon: Marlyce [name], MD;  Location: [facility];  Service: General;  Laterality: N/A;  . CORONARY ARTERY BYPASS GRAFT N/A 07/02/2013   Procedure: CORONARY ARTERY BYPASS GRAFTING (CABG);  Surgeon: Ivin [name], MD;  Location: [place];  Service: Open Heart Surgery;  Laterality: N/A;  CABG x three, using left internal mammary artery and right leg greater saphenous vein harvested endoscopically  .  ESOPHAGOGASTRODUODENOSCOPY (EGD) WITH PROPOFOL N/A 11/24/2015   Procedure: ESOPHAGOGASTRODUODENOSCOPY (EGD) WITH PROPOFOL;  Surgeon: [name], MD;  Location: [facility];  Service: Endoscopy;  Laterality: N/A;  Diabetic - insulin  . EYE SURGERY Bilateral    Cataract Extraction with IOL  . INTRAOPERATIVE TRANSESOPHAGEAL ECHOCARDIOGRAM N/A 07/02/2013   Procedure: INTRAOPERATIVE TRANSESOPHAGEAL ECHOCARDIOGRAM;  Surgeon: Ivin [name], MD;  Location: [place];  Service: Open Heart Surgery;  Laterality: N/A;  . PERIPHERAL VASCULAR CATHETERIZATION Right 12/06/2015   Procedure: Dialysis/Perma Catheter Insertion;  Surgeon: Katha Cabal, MD;  Location: [place] CV LAB;  Service: Cardiovascular;  Laterality: Right;  . PERIPHERAL VASCULAR THROMBECTOMY Right 09/28/2016   Procedure: Peripheral Vascular Thrombectomy;  Surgeon: Katha Cabal, MD;  Location: [place] CV LAB;  Service: Cardiovascular;  Laterality: Right;  . PERIPHERAL VASCULAR THROMBECTOMY Right 05/20/2018   Procedure: PERIPHERAL VASCULAR THROMBECTOMY;  Surgeon: Katha Cabal, MD;  Location: [place] CV LAB;  Service: Cardiovascular;  Laterality: Right;  . PORTA CATH REMOVAL N/A 06/01/2016   Procedure: Glori [name] Cath Removal;  Surgeon: Katha Cabal, MD;  Location: [place] CV LAB;  Service: Cardiovascular;  Laterality: N/A;  . THORACENTESIS Left 2015     Current Outpatient Medications  Medication Sig Dispense Refill  . ACCU-CHEK FASTCLIX LANCETS MISC Check blood glucose twice daily, diagnosis code E11.9 204 each 3  . acetaminophen (TYLENOL) 325 MG tablet Take 650 mg by mouth daily as needed for moderate pain or headache.     . Alirocumab (PRALUENT) 75 MG/ML SOAJ Inject 75 mg into the skin every 14 (fourteen) days. 2 pen 11  . Amino Acids-Protein Hydrolys (FEEDING SUPPLEMENT, PRO-STAT SUGAR FREE 64,) LIQD Take 30 mLs by mouth 3 (three) times daily between meals. 887 mL 0  . amiodarone (PACERONE) 200 MG  tablet Take 1 tablet (200 mg total) by mouth 2 (two) times daily for 4 days, THEN 1 tablet (200 mg total) daily.  0  . aspirin EC 81 MG tablet Take 81 mg by mouth daily.    Marland Kitchen atorvastatin (LIPITOR) 80 MG tablet Take 1 tablet (80 mg total) by mouth daily at 6 PM. 90 tablet 1  . blood glucose meter kit and supplies KIT Dispense based on patient and insurance preference. Use up to twice daily. (FOR ICD-10 E11.9) 1 each 0  . Blood Glucose Monitoring Suppl (ACCU-CHEK AVIVA PLUS) w/Device KIT Use as directed to test blood sugar up to three times daily 1 kit 0  . Calcium Acetate 668 (169 Ca) MG TABS Take 2,004 mg by mouth 3 (three) times daily with meals.   6  . ergocalciferol (VITAMIN D2) 50000 units capsule Take 50,000 Units by mouth once a week.     . ferrous sulfate 325 (65 FE) MG tablet Take 325 mg by mouth daily with breakfast.    . fluticasone (FLONASE) 50 MCG/ACT nasal spray Place 2 sprays into both nostrils daily. 16 g 6  . furosemide (LASIX) 20 MG tablet Take 40 mg by mouth every other day. Non-dialysis days     .  glucose blood (ACCU-CHEK AVIVA) test strip Use as instructed to test blood sugar up to 3 times daily 100 each 12  . insulin aspart (NOVOLOG) 100 UNIT/ML injection Inject 0-9 Units into the skin 3 (three) times daily with meals. 10 mL 1  . insulin aspart (NOVOLOG) 100 UNIT/ML injection Inject 0-5 Units into the skin at bedtime. 10 mL 1  . insulin glargine (LANTUS) 100 UNIT/ML injection Inject 0.1 mLs (10 Units total) into the skin daily. If CBG greater than 150 consistently 10 mL 11  . Lancets (ACCU-CHEK SOFT TOUCH) lancets Use as instructed 100 each 12  . Lancets Misc. (ACCU-CHEK SOFTCLIX LANCET DEV) KIT Use as directed to test blood sugar up to three times daily 1 kit 0  . levothyroxine (SYNTHROID) 50 MCG tablet Take 1 tablet (50 mcg total) by mouth daily at 6 (six) AM.    . lidocaine-prilocaine (EMLA) cream Apply 1 application topically as needed (port access).    . midodrine  (PROAMATINE) 10 MG tablet Take 1 tablet (10 mg total) by mouth 3 (three) times daily with meals.    . mirtazapine (REMERON) 15 MG tablet Take 1 tablet (15 mg total) by mouth at bedtime. 30 tablet 1  . multivitamin (RENA-VIT) TABS tablet Take 1 tablet by mouth at bedtime.  0  . nitroGLYCERIN (NITROSTAT) 0.4 MG SL tablet DISSOLVE ONE TABLET UNDER THE TONGUE EVERY 5 MINUTES AS NEEDED FOR CHEST PAIN.  DO NOT EXCEED A TOTAL OF 3 DOSES IN 15 MINUTES (Patient taking differently: Place 0.4 mg under the tongue every 5 (five) minutes as needed for chest pain. DISSOLVE ONE TABLET UNDER THE TONGUE EVERY 5 MINUTES AS NEEDED FOR CHEST PAIN.  DO NOT EXCEED A TOTAL OF 3 DOSES IN 15 MINUTES) 25 tablet 0  . omeprazole (PRILOSEC) 20 MG capsule Take 20 mg by mouth 2 (two) times daily before a meal.    . ondansetron (ZOFRAN-ODT) 4 MG disintegrating tablet Take 4 mg by mouth every 8 (eight) hours as needed for nausea or vomiting.     . polyethylene glycol (MIRALAX / GLYCOLAX) 17 g packet Take 17 g by mouth daily. 14 each 0  . venlafaxine (EFFEXOR) 75 MG tablet Take 1 tablet (75 mg total) by mouth 2 (two) times daily with a meal. 60 tablet 0  . vitamin C (VITAMIN C) 500 MG tablet Take 1 tablet (500 mg total) by mouth daily.    Marland Kitchen zinc sulfate 220 (50 Zn) MG capsule Take 1 capsule (220 mg total) by mouth daily.     No current facility-administered medications for this visit.     Allergies:   Nsaids and Doxycycline    Social History:  The patient  reports that she has never smoked. She has never used smokeless tobacco. She reports that she does not drink alcohol or use drugs.   Family History:  The patient's family history includes COPD in her mother; Cancer in her mother; Diabetes in her father and paternal grandfather; Heart disease in her maternal grandmother; Pulmonary embolism in her father.    ROS:  Please see the history of present illness.   Otherwise, review of systems are positive for none.   All other systems  are reviewed and negative.    PHYSICAL EXAM: VS:  There were no vitals taken for this visit. , BMI There is no height or weight on file to calculate BMI. GEN: Well nourished, well developed, in no acute distress  HEENT: normal  Neck: no JVD, carotid bruits,  or masses Cardiac: RRR; no murmurs, rubs, or gallops, trace edema  Respiratory:  clear to auscultation bilaterally, normal work of breathing GI: soft, nontender, nondistended, + BS MS: no deformity or atrophy  Skin: warm and dry, no rash Neuro:  Strength and sensation are intact Psych: euthymic mood, full affect   EKG:  EKG is ordered today. EKG showed normal sinus rhythm with no significant ST or T wave changes.   Recent Labs: 08/23/2018: ALT 16 09/11/2018: TSH 3.665 09/13/2018: Hemoglobin 11.5; Platelets 130 09/14/2018: Magnesium 2.4 09/15/2018: BUN 55; Creatinine, Ser 9.77; Potassium 4.7; Sodium 137    Lipid Panel    Component Value Date/Time   CHOL 153 02/11/2018 1404   TRIG 294 (H) 02/11/2018 1404   HDL 43 02/11/2018 1404   CHOLHDL 3.6 02/11/2018 1404   CHOLHDL 3.4 11/07/2017 0950   VLDL 22 11/07/2017 0950   LDLCALC 51 02/11/2018 1404   LDLDIRECT 60 02/11/2018 1404   LDLDIRECT 126.9 10/31/2012 0801      Wt Readings from Last 3 Encounters:  09/13/18 193 lb 5.5 oz (87.7 kg)  08/23/18 210 lb (95.3 kg)  05/20/18 210 lb (95.3 kg)       ASSESSMENT AND PLAN:  1.  Chronic systolic/diastolic heart failure: No recent evaluation for ejection fraction.  She is off all antihypertensive medications due to low blood pressure during dialysis.  I requested a follow-up echocardiogram.  2. Coronary artery disease involving native coronary arteries without angina: She is overall doing reasonably well.  Continue medical Therapy.  3.  Hypotension associated with dialysis: Improved with daily Midodrin.    4.  Hyperlipidemia: Continue high-dose atorvastatin and Praluent.  I requested a follow-up lipid and liver profile.    Disposition:   FU with me in 6 months  Signed,  Kathlyn Sacramento, MD  01/06/2019 2:09 PM    [place]

## 2019-01-07 DIAGNOSIS — N2581 Secondary hyperparathyroidism of renal origin: Secondary | ICD-10-CM | POA: Diagnosis not present

## 2019-01-07 DIAGNOSIS — N186 End stage renal disease: Secondary | ICD-10-CM | POA: Diagnosis not present

## 2019-01-07 DIAGNOSIS — Z992 Dependence on renal dialysis: Secondary | ICD-10-CM | POA: Diagnosis not present

## 2019-01-07 DIAGNOSIS — E119 Type 2 diabetes mellitus without complications: Secondary | ICD-10-CM | POA: Diagnosis not present

## 2019-01-07 DIAGNOSIS — D631 Anemia in chronic kidney disease: Secondary | ICD-10-CM | POA: Diagnosis not present

## 2019-01-07 DIAGNOSIS — D509 Iron deficiency anemia, unspecified: Secondary | ICD-10-CM | POA: Diagnosis not present

## 2019-01-09 DIAGNOSIS — D631 Anemia in chronic kidney disease: Secondary | ICD-10-CM | POA: Diagnosis not present

## 2019-01-09 DIAGNOSIS — N186 End stage renal disease: Secondary | ICD-10-CM | POA: Diagnosis not present

## 2019-01-09 DIAGNOSIS — D509 Iron deficiency anemia, unspecified: Secondary | ICD-10-CM | POA: Diagnosis not present

## 2019-01-09 DIAGNOSIS — N2581 Secondary hyperparathyroidism of renal origin: Secondary | ICD-10-CM | POA: Diagnosis not present

## 2019-01-09 DIAGNOSIS — Z992 Dependence on renal dialysis: Secondary | ICD-10-CM | POA: Diagnosis not present

## 2019-01-09 DIAGNOSIS — E119 Type 2 diabetes mellitus without complications: Secondary | ICD-10-CM | POA: Diagnosis not present

## 2019-01-12 DIAGNOSIS — N186 End stage renal disease: Secondary | ICD-10-CM | POA: Diagnosis not present

## 2019-01-12 DIAGNOSIS — E119 Type 2 diabetes mellitus without complications: Secondary | ICD-10-CM | POA: Diagnosis not present

## 2019-01-12 DIAGNOSIS — Z992 Dependence on renal dialysis: Secondary | ICD-10-CM | POA: Diagnosis not present

## 2019-01-12 DIAGNOSIS — D509 Iron deficiency anemia, unspecified: Secondary | ICD-10-CM | POA: Diagnosis not present

## 2019-01-12 DIAGNOSIS — N2581 Secondary hyperparathyroidism of renal origin: Secondary | ICD-10-CM | POA: Diagnosis not present

## 2019-01-12 DIAGNOSIS — D631 Anemia in chronic kidney disease: Secondary | ICD-10-CM | POA: Diagnosis not present

## 2019-01-13 DIAGNOSIS — I120 Hypertensive chronic kidney disease with stage 5 chronic kidney disease or end stage renal disease: Secondary | ICD-10-CM | POA: Diagnosis not present

## 2019-01-13 DIAGNOSIS — N186 End stage renal disease: Secondary | ICD-10-CM | POA: Diagnosis not present

## 2019-01-13 DIAGNOSIS — M6281 Muscle weakness (generalized): Secondary | ICD-10-CM | POA: Diagnosis not present

## 2019-01-13 DIAGNOSIS — M545 Low back pain: Secondary | ICD-10-CM | POA: Diagnosis not present

## 2019-01-13 DIAGNOSIS — E785 Hyperlipidemia, unspecified: Secondary | ICD-10-CM | POA: Diagnosis not present

## 2019-01-13 DIAGNOSIS — R269 Unspecified abnormalities of gait and mobility: Secondary | ICD-10-CM | POA: Diagnosis not present

## 2019-01-13 DIAGNOSIS — N189 Chronic kidney disease, unspecified: Secondary | ICD-10-CM | POA: Diagnosis not present

## 2019-01-13 DIAGNOSIS — R5381 Other malaise: Secondary | ICD-10-CM | POA: Diagnosis not present

## 2019-01-13 DIAGNOSIS — I2510 Atherosclerotic heart disease of native coronary artery without angina pectoris: Secondary | ICD-10-CM | POA: Diagnosis not present

## 2019-01-14 DIAGNOSIS — N2581 Secondary hyperparathyroidism of renal origin: Secondary | ICD-10-CM | POA: Diagnosis not present

## 2019-01-14 DIAGNOSIS — D631 Anemia in chronic kidney disease: Secondary | ICD-10-CM | POA: Diagnosis not present

## 2019-01-14 DIAGNOSIS — E119 Type 2 diabetes mellitus without complications: Secondary | ICD-10-CM | POA: Diagnosis not present

## 2019-01-14 DIAGNOSIS — Z992 Dependence on renal dialysis: Secondary | ICD-10-CM | POA: Diagnosis not present

## 2019-01-14 DIAGNOSIS — D509 Iron deficiency anemia, unspecified: Secondary | ICD-10-CM | POA: Diagnosis not present

## 2019-01-14 DIAGNOSIS — N186 End stage renal disease: Secondary | ICD-10-CM | POA: Diagnosis not present

## 2019-01-15 DIAGNOSIS — Z03818 Encounter for observation for suspected exposure to other biological agents ruled out: Secondary | ICD-10-CM | POA: Diagnosis not present

## 2019-01-17 DIAGNOSIS — D631 Anemia in chronic kidney disease: Secondary | ICD-10-CM | POA: Insufficient documentation

## 2019-01-17 DIAGNOSIS — R0602 Shortness of breath: Secondary | ICD-10-CM | POA: Insufficient documentation

## 2019-01-17 DIAGNOSIS — E440 Moderate protein-calorie malnutrition: Secondary | ICD-10-CM | POA: Insufficient documentation

## 2019-01-17 DIAGNOSIS — R509 Fever, unspecified: Secondary | ICD-10-CM | POA: Insufficient documentation

## 2019-01-17 DIAGNOSIS — R52 Pain, unspecified: Secondary | ICD-10-CM | POA: Insufficient documentation

## 2019-01-17 DIAGNOSIS — N2581 Secondary hyperparathyroidism of renal origin: Secondary | ICD-10-CM | POA: Insufficient documentation

## 2019-01-17 DIAGNOSIS — R519 Headache, unspecified: Secondary | ICD-10-CM | POA: Insufficient documentation

## 2019-01-17 DIAGNOSIS — D689 Coagulation defect, unspecified: Secondary | ICD-10-CM | POA: Insufficient documentation

## 2019-01-17 DIAGNOSIS — E8779 Other fluid overload: Secondary | ICD-10-CM | POA: Insufficient documentation

## 2019-01-17 DIAGNOSIS — D509 Iron deficiency anemia, unspecified: Secondary | ICD-10-CM | POA: Insufficient documentation

## 2019-01-17 DIAGNOSIS — L299 Pruritus, unspecified: Secondary | ICD-10-CM | POA: Insufficient documentation

## 2019-01-19 ENCOUNTER — Telehealth: Payer: Self-pay | Admitting: Family Medicine

## 2019-01-19 NOTE — Telephone Encounter (Signed)
HFU/ pt is being discharged from Rehab to home today/Pt was admitted into rehab on 10/11/2018 from [facility]/Pt is scheduled on 02/24/2019 @ 11:30am. Pt DX was Lung standing persistant atrial fib.

## 2019-01-19 NOTE — Telephone Encounter (Signed)
Noted. Will follow as appropriate.  

## 2019-01-20 ENCOUNTER — Telehealth: Payer: Self-pay | Admitting: *Deleted

## 2019-01-20 DIAGNOSIS — I2510 Atherosclerotic heart disease of native coronary artery without angina pectoris: Secondary | ICD-10-CM | POA: Diagnosis not present

## 2019-01-20 DIAGNOSIS — N186 End stage renal disease: Secondary | ICD-10-CM | POA: Diagnosis not present

## 2019-01-20 DIAGNOSIS — I4811 Longstanding persistent atrial fibrillation: Secondary | ICD-10-CM | POA: Diagnosis not present

## 2019-01-20 DIAGNOSIS — I509 Heart failure, unspecified: Secondary | ICD-10-CM | POA: Diagnosis not present

## 2019-01-20 DIAGNOSIS — E039 Hypothyroidism, unspecified: Secondary | ICD-10-CM | POA: Diagnosis not present

## 2019-01-20 DIAGNOSIS — R279 Unspecified lack of coordination: Secondary | ICD-10-CM | POA: Diagnosis not present

## 2019-01-20 DIAGNOSIS — K219 Gastro-esophageal reflux disease without esophagitis: Secondary | ICD-10-CM | POA: Diagnosis not present

## 2019-01-20 DIAGNOSIS — Z951 Presence of aortocoronary bypass graft: Secondary | ICD-10-CM | POA: Diagnosis not present

## 2019-01-20 DIAGNOSIS — Z992 Dependence on renal dialysis: Secondary | ICD-10-CM | POA: Diagnosis not present

## 2019-01-20 DIAGNOSIS — M6281 Muscle weakness (generalized): Secondary | ICD-10-CM | POA: Diagnosis not present

## 2019-01-20 DIAGNOSIS — E1142 Type 2 diabetes mellitus with diabetic polyneuropathy: Secondary | ICD-10-CM | POA: Diagnosis not present

## 2019-01-20 DIAGNOSIS — R2689 Other abnormalities of gait and mobility: Secondary | ICD-10-CM | POA: Diagnosis not present

## 2019-01-20 DIAGNOSIS — D649 Anemia, unspecified: Secondary | ICD-10-CM | POA: Diagnosis not present

## 2019-01-20 DIAGNOSIS — Z8619 Personal history of other infectious and parasitic diseases: Secondary | ICD-10-CM | POA: Diagnosis not present

## 2019-01-20 DIAGNOSIS — E1122 Type 2 diabetes mellitus with diabetic chronic kidney disease: Secondary | ICD-10-CM | POA: Diagnosis not present

## 2019-01-20 DIAGNOSIS — I132 Hypertensive heart and chronic kidney disease with heart failure and with stage 5 chronic kidney disease, or end stage renal disease: Secondary | ICD-10-CM | POA: Diagnosis not present

## 2019-01-20 DIAGNOSIS — F418 Other specified anxiety disorders: Secondary | ICD-10-CM | POA: Diagnosis not present

## 2019-01-20 NOTE — Telephone Encounter (Signed)
Copied from [place] [phone]. Topic: General - Inquiry >> Jan 20, 2019 12:55 PM Richardo Priest, NT wrote: Reason for CRM: Pt's husband called in saying he would like to speak with CMA or PCP in regards to wife's recent stay in rehab. Pt's husband is unsure the amount of insulin he is supposed to be giving wife as he is unclear of what she was receiving in center. Pt's husband also believes some of her medications may have changed so he would like clarification. Please advise.

## 2019-01-20 NOTE — Telephone Encounter (Signed)
Unable to reach patient for transitional care management. Will follow as appropriate.  

## 2019-01-21 ENCOUNTER — Other Ambulatory Visit: Payer: Self-pay

## 2019-01-21 DIAGNOSIS — N2581 Secondary hyperparathyroidism of renal origin: Secondary | ICD-10-CM | POA: Diagnosis not present

## 2019-01-21 DIAGNOSIS — D509 Iron deficiency anemia, unspecified: Secondary | ICD-10-CM | POA: Diagnosis not present

## 2019-01-21 DIAGNOSIS — Z992 Dependence on renal dialysis: Secondary | ICD-10-CM | POA: Diagnosis not present

## 2019-01-21 DIAGNOSIS — E1129 Type 2 diabetes mellitus with other diabetic kidney complication: Secondary | ICD-10-CM | POA: Diagnosis not present

## 2019-01-21 DIAGNOSIS — D631 Anemia in chronic kidney disease: Secondary | ICD-10-CM | POA: Diagnosis not present

## 2019-01-21 DIAGNOSIS — N186 End stage renal disease: Secondary | ICD-10-CM | POA: Diagnosis not present

## 2019-01-21 NOTE — Telephone Encounter (Signed)
Left message on patient mobile number and on DPR mobile number to call office.

## 2019-01-21 NOTE — Telephone Encounter (Signed)
Copied from [place] [phone]. Topic: General - Inquiry >> Jan 20, 2019 12:55 PM Richardo Priest, NT wrote: Reason for CRM: Pt's husband called in saying he would like to speak with CMA or PCP in regards to wife's recent stay in rehab. Pt's husband is unsure the amount of insulin he is supposed to be giving wife as he is unclear of what she was receiving in center. Pt's husband also believes some of her medications may have changed so he would like clarification. Please advise.

## 2019-01-21 NOTE — Telephone Encounter (Signed)
Second attempt to reach patient for transitional care management. The line was picked up, and conversation in the background but no answer directly. Will call again.

## 2019-01-22 DIAGNOSIS — I4811 Longstanding persistent atrial fibrillation: Secondary | ICD-10-CM | POA: Diagnosis not present

## 2019-01-22 DIAGNOSIS — N186 End stage renal disease: Secondary | ICD-10-CM | POA: Diagnosis not present

## 2019-01-22 DIAGNOSIS — E1122 Type 2 diabetes mellitus with diabetic chronic kidney disease: Secondary | ICD-10-CM | POA: Diagnosis not present

## 2019-01-22 DIAGNOSIS — I509 Heart failure, unspecified: Secondary | ICD-10-CM | POA: Diagnosis not present

## 2019-01-22 DIAGNOSIS — I132 Hypertensive heart and chronic kidney disease with heart failure and with stage 5 chronic kidney disease, or end stage renal disease: Secondary | ICD-10-CM | POA: Diagnosis not present

## 2019-01-22 DIAGNOSIS — I2510 Atherosclerotic heart disease of native coronary artery without angina pectoris: Secondary | ICD-10-CM | POA: Diagnosis not present

## 2019-01-22 NOTE — Telephone Encounter (Signed)
Patients husband called and wanted to know how much [place]ar insulin the patient is suppose to be taking since she left the Rehab facility, From our records I informed him that she is to take 10 units nightly. He understood.  Nina,cma

## 2019-01-22 NOTE — Telephone Encounter (Signed)
Third attempt to reach patient for transitional care management. No answer. Left message to call the office back to schedule hospital follow up.

## 2019-01-23 DIAGNOSIS — D509 Iron deficiency anemia, unspecified: Secondary | ICD-10-CM | POA: Diagnosis not present

## 2019-01-23 DIAGNOSIS — E1129 Type 2 diabetes mellitus with other diabetic kidney complication: Secondary | ICD-10-CM | POA: Diagnosis not present

## 2019-01-23 DIAGNOSIS — Z992 Dependence on renal dialysis: Secondary | ICD-10-CM | POA: Diagnosis not present

## 2019-01-23 DIAGNOSIS — D631 Anemia in chronic kidney disease: Secondary | ICD-10-CM | POA: Diagnosis not present

## 2019-01-23 DIAGNOSIS — N186 End stage renal disease: Secondary | ICD-10-CM | POA: Diagnosis not present

## 2019-01-23 DIAGNOSIS — N2581 Secondary hyperparathyroidism of renal origin: Secondary | ICD-10-CM | POA: Diagnosis not present

## 2019-01-25 DIAGNOSIS — E1129 Type 2 diabetes mellitus with other diabetic kidney complication: Secondary | ICD-10-CM | POA: Diagnosis not present

## 2019-01-25 DIAGNOSIS — N186 End stage renal disease: Secondary | ICD-10-CM | POA: Diagnosis not present

## 2019-01-25 DIAGNOSIS — N2581 Secondary hyperparathyroidism of renal origin: Secondary | ICD-10-CM | POA: Diagnosis not present

## 2019-01-25 DIAGNOSIS — D509 Iron deficiency anemia, unspecified: Secondary | ICD-10-CM | POA: Diagnosis not present

## 2019-01-25 DIAGNOSIS — Z992 Dependence on renal dialysis: Secondary | ICD-10-CM | POA: Diagnosis not present

## 2019-01-25 DIAGNOSIS — D631 Anemia in chronic kidney disease: Secondary | ICD-10-CM | POA: Diagnosis not present

## 2019-01-26 ENCOUNTER — Other Ambulatory Visit: Payer: Self-pay | Admitting: Family Medicine

## 2019-01-26 DIAGNOSIS — E1122 Type 2 diabetes mellitus with diabetic chronic kidney disease: Secondary | ICD-10-CM | POA: Diagnosis not present

## 2019-01-26 DIAGNOSIS — I132 Hypertensive heart and chronic kidney disease with heart failure and with stage 5 chronic kidney disease, or end stage renal disease: Secondary | ICD-10-CM | POA: Diagnosis not present

## 2019-01-26 DIAGNOSIS — I2510 Atherosclerotic heart disease of native coronary artery without angina pectoris: Secondary | ICD-10-CM | POA: Diagnosis not present

## 2019-01-26 DIAGNOSIS — I4811 Longstanding persistent atrial fibrillation: Secondary | ICD-10-CM | POA: Diagnosis not present

## 2019-01-26 DIAGNOSIS — I509 Heart failure, unspecified: Secondary | ICD-10-CM | POA: Diagnosis not present

## 2019-01-26 DIAGNOSIS — N186 End stage renal disease: Secondary | ICD-10-CM | POA: Diagnosis not present

## 2019-01-26 NOTE — Telephone Encounter (Signed)
Copied from [place] [id number]. Topic: Quick Communication - Rx Refill/Question >> Jan 26, 2019  1:01 PM Izola Price, [place] A wrote: Medication: ondansetron (ZOFRAN-ODT) 4 MG disintegrating tablet  Has the patient contacted their pharmacy? {Yes (Agent: If no, request that the patient contact the pharmacy for the refill.) (Agent: If yes, when and what did the pharmacy advise?)Contact PCP  Preferred Pharmacy (with phone number or street name): [facility] [address] [place] [phone] (Phone) [phone] (Fax)    Agent: Please be advised that RX refills may take up to 3 business days. We ask that you follow-up with your pharmacy.

## 2019-01-26 NOTE — Telephone Encounter (Signed)
Requested medication (s) are due for refill today: yes  Requested medication (s) are on the active medication list: yes  Last refill:  08/14/17 historic med  Future visit scheduled: yes  Notes to clinic:  Not delegated Historic med and provider    Requested Prescriptions  Pending Prescriptions Disp Refills   ondansetron (ZOFRAN-ODT) 4 MG disintegrating tablet 20 tablet     Sig: Take 1 tablet (4 mg total) by mouth every 8 (eight) hours as needed for nausea or vomiting.     Not Delegated - Gastroenterology: Antiemetics Failed - 01/26/2019  1:08 PM      Failed - This refill cannot be delegated      Failed - Valid encounter within last 6 months    Recent Outpatient Visits          6 months ago Type 2 diabetes mellitus with diabetic neuropathy, without long-term current use of insulin ([place])   [facility], Angela Adam, MD   1 year ago Hypotension, unspecified hypotension type   [facility] [name], MD   1 year ago ESRD on dialysis [facility])   [facility] [name], MD   1 year ago Type 2 diabetes mellitus with diabetic neuropathy, without long-term current use of insulin [facility])   [place], [place], [place]   1 year ago Chronic neck pain    [facility] [place], Angela Adam, MD      Future Appointments            In 4 weeks Caryl Bis, Angela Adam, MD [place], [place]   In 1 month Caryl Bis, Angela Adam, MD [facility], [facility]

## 2019-01-27 DIAGNOSIS — D631 Anemia in chronic kidney disease: Secondary | ICD-10-CM | POA: Diagnosis not present

## 2019-01-27 DIAGNOSIS — Z992 Dependence on renal dialysis: Secondary | ICD-10-CM | POA: Diagnosis not present

## 2019-01-27 DIAGNOSIS — D509 Iron deficiency anemia, unspecified: Secondary | ICD-10-CM | POA: Diagnosis not present

## 2019-01-27 DIAGNOSIS — N186 End stage renal disease: Secondary | ICD-10-CM | POA: Diagnosis not present

## 2019-01-27 DIAGNOSIS — N2581 Secondary hyperparathyroidism of renal origin: Secondary | ICD-10-CM | POA: Diagnosis not present

## 2019-01-27 DIAGNOSIS — E1129 Type 2 diabetes mellitus with other diabetic kidney complication: Secondary | ICD-10-CM | POA: Diagnosis not present

## 2019-01-28 DIAGNOSIS — I132 Hypertensive heart and chronic kidney disease with heart failure and with stage 5 chronic kidney disease, or end stage renal disease: Secondary | ICD-10-CM | POA: Diagnosis not present

## 2019-01-28 DIAGNOSIS — I509 Heart failure, unspecified: Secondary | ICD-10-CM | POA: Diagnosis not present

## 2019-01-28 DIAGNOSIS — I2510 Atherosclerotic heart disease of native coronary artery without angina pectoris: Secondary | ICD-10-CM | POA: Diagnosis not present

## 2019-01-28 DIAGNOSIS — I4811 Longstanding persistent atrial fibrillation: Secondary | ICD-10-CM | POA: Diagnosis not present

## 2019-01-28 DIAGNOSIS — E1122 Type 2 diabetes mellitus with diabetic chronic kidney disease: Secondary | ICD-10-CM | POA: Diagnosis not present

## 2019-01-28 DIAGNOSIS — N186 End stage renal disease: Secondary | ICD-10-CM | POA: Diagnosis not present

## 2019-01-28 MED ORDER — ONDANSETRON 4 MG PO TBDP: 4.0000 mg | ORAL_TABLET | Freq: Three times a day (TID) | ORAL | 0 refills | Status: DC | PRN

## 2019-01-30 DIAGNOSIS — Z992 Dependence on renal dialysis: Secondary | ICD-10-CM | POA: Diagnosis not present

## 2019-01-30 DIAGNOSIS — E1129 Type 2 diabetes mellitus with other diabetic kidney complication: Secondary | ICD-10-CM | POA: Diagnosis not present

## 2019-01-30 DIAGNOSIS — N186 End stage renal disease: Secondary | ICD-10-CM | POA: Diagnosis not present

## 2019-01-30 DIAGNOSIS — D509 Iron deficiency anemia, unspecified: Secondary | ICD-10-CM | POA: Diagnosis not present

## 2019-01-30 DIAGNOSIS — N2581 Secondary hyperparathyroidism of renal origin: Secondary | ICD-10-CM | POA: Diagnosis not present

## 2019-01-30 DIAGNOSIS — D631 Anemia in chronic kidney disease: Secondary | ICD-10-CM | POA: Diagnosis not present

## 2019-02-02 DIAGNOSIS — Z992 Dependence on renal dialysis: Secondary | ICD-10-CM | POA: Diagnosis not present

## 2019-02-02 DIAGNOSIS — E1129 Type 2 diabetes mellitus with other diabetic kidney complication: Secondary | ICD-10-CM | POA: Diagnosis not present

## 2019-02-02 DIAGNOSIS — N186 End stage renal disease: Secondary | ICD-10-CM | POA: Diagnosis not present

## 2019-02-02 DIAGNOSIS — E1122 Type 2 diabetes mellitus with diabetic chronic kidney disease: Secondary | ICD-10-CM | POA: Diagnosis not present

## 2019-02-02 DIAGNOSIS — D631 Anemia in chronic kidney disease: Secondary | ICD-10-CM | POA: Diagnosis not present

## 2019-02-02 DIAGNOSIS — D509 Iron deficiency anemia, unspecified: Secondary | ICD-10-CM | POA: Diagnosis not present

## 2019-02-02 DIAGNOSIS — N2581 Secondary hyperparathyroidism of renal origin: Secondary | ICD-10-CM | POA: Diagnosis not present

## 2019-02-03 ENCOUNTER — Telehealth: Payer: Self-pay | Admitting: Family Medicine

## 2019-02-03 DIAGNOSIS — I2510 Atherosclerotic heart disease of native coronary artery without angina pectoris: Secondary | ICD-10-CM | POA: Diagnosis not present

## 2019-02-03 DIAGNOSIS — N186 End stage renal disease: Secondary | ICD-10-CM | POA: Diagnosis not present

## 2019-02-03 DIAGNOSIS — I4811 Longstanding persistent atrial fibrillation: Secondary | ICD-10-CM | POA: Diagnosis not present

## 2019-02-03 DIAGNOSIS — I132 Hypertensive heart and chronic kidney disease with heart failure and with stage 5 chronic kidney disease, or end stage renal disease: Secondary | ICD-10-CM | POA: Diagnosis not present

## 2019-02-03 DIAGNOSIS — I509 Heart failure, unspecified: Secondary | ICD-10-CM | POA: Diagnosis not present

## 2019-02-03 DIAGNOSIS — E1122 Type 2 diabetes mellitus with diabetic chronic kidney disease: Secondary | ICD-10-CM | POA: Diagnosis not present

## 2019-02-03 NOTE — Telephone Encounter (Signed)
Jamie [facility]as [facility] called stating she was with patient today and  Patient BP was 93/38. Extremely low.  Elmo Putt states she did do exercise with patient, and patient BP stated the same. Elmo Putt is no longer with patient, she is requested PCP to reach out  Jamie Arias Call back [phone]

## 2019-02-03 NOTE — Telephone Encounter (Signed)
I have not seen the patient in the past 7 months. She needs to be seen in person if her BP is that low. Please see if she is having any symptoms (light headedness, fatigue, dizziness, other symptoms). They should also contact what ever doctor is prescribing the midodrine to let them know about her BPs.

## 2019-02-03 NOTE — Telephone Encounter (Signed)
Home health called and stated the patient's BP was low so she did not do any exercises today, I called and spoke to the patient's husband and he stated the patient was asleep but the home health nurse gave her midodrine to bring the pressure up she was giving 10 mg and he retook her BP 30 min's later it was 126/44.  Please advise.  Nina,cma

## 2019-02-05 DIAGNOSIS — N186 End stage renal disease: Secondary | ICD-10-CM | POA: Diagnosis not present

## 2019-02-05 DIAGNOSIS — I509 Heart failure, unspecified: Secondary | ICD-10-CM | POA: Diagnosis not present

## 2019-02-05 DIAGNOSIS — I2510 Atherosclerotic heart disease of native coronary artery without angina pectoris: Secondary | ICD-10-CM | POA: Diagnosis not present

## 2019-02-05 DIAGNOSIS — E1122 Type 2 diabetes mellitus with diabetic chronic kidney disease: Secondary | ICD-10-CM | POA: Diagnosis not present

## 2019-02-05 DIAGNOSIS — I132 Hypertensive heart and chronic kidney disease with heart failure and with stage 5 chronic kidney disease, or end stage renal disease: Secondary | ICD-10-CM | POA: Diagnosis not present

## 2019-02-05 DIAGNOSIS — I4811 Longstanding persistent atrial fibrillation: Secondary | ICD-10-CM | POA: Diagnosis not present

## 2019-02-05 NOTE — Telephone Encounter (Signed)
I called the patient's husband 2 days ago and informed him to call the prescriber of the midirone medication.  He understood.  Rosalene Wardrop,cma

## 2019-02-09 ENCOUNTER — Other Ambulatory Visit: Payer: Self-pay

## 2019-02-09 ENCOUNTER — Emergency Department: Payer: Medicare Other

## 2019-02-09 ENCOUNTER — Inpatient Hospital Stay
Admission: EM | Admit: 2019-02-09 | Discharge: 2019-04-20 | DRG: 252 | Disposition: A | Discharge status: Skilled Nursing Facility | Payer: Medicare Other | Attending: Internal Medicine | Admitting: Internal Medicine

## 2019-02-09 ENCOUNTER — Encounter: Payer: Self-pay | Admitting: Emergency Medicine

## 2019-02-09 DIAGNOSIS — Y841 Kidney dialysis as the cause of abnormal reaction of the patient, or of later complication, without mention of misadventure at the time of the procedure: Secondary | ICD-10-CM | POA: Diagnosis not present

## 2019-02-09 DIAGNOSIS — L8995 Pressure ulcer of unspecified site, unstageable: Secondary | ICD-10-CM | POA: Diagnosis not present

## 2019-02-09 DIAGNOSIS — Z8249 Family history of ischemic heart disease and other diseases of the circulatory system: Secondary | ICD-10-CM

## 2019-02-09 DIAGNOSIS — R6521 Severe sepsis with septic shock: Secondary | ICD-10-CM | POA: Diagnosis present

## 2019-02-09 DIAGNOSIS — R410 Disorientation, unspecified: Secondary | ICD-10-CM | POA: Diagnosis not present

## 2019-02-09 DIAGNOSIS — Z538 Procedure and treatment not carried out for other reasons: Secondary | ICD-10-CM | POA: Diagnosis not present

## 2019-02-09 DIAGNOSIS — R112 Nausea with vomiting, unspecified: Secondary | ICD-10-CM

## 2019-02-09 DIAGNOSIS — R4182 Altered mental status, unspecified: Principal | ICD-10-CM

## 2019-02-09 DIAGNOSIS — I272 Pulmonary hypertension, unspecified: Secondary | ICD-10-CM | POA: Diagnosis present

## 2019-02-09 DIAGNOSIS — Z992 Dependence on renal dialysis: Secondary | ICD-10-CM | POA: Diagnosis not present

## 2019-02-09 DIAGNOSIS — A4102 Sepsis due to Methicillin resistant Staphylococcus aureus: Secondary | ICD-10-CM | POA: Diagnosis present

## 2019-02-09 DIAGNOSIS — J95821 Acute postprocedural respiratory failure: Secondary | ICD-10-CM | POA: Diagnosis not present

## 2019-02-09 DIAGNOSIS — I252 Old myocardial infarction: Secondary | ICD-10-CM

## 2019-02-09 DIAGNOSIS — G9341 Metabolic encephalopathy: Secondary | ICD-10-CM

## 2019-02-09 DIAGNOSIS — I2510 Atherosclerotic heart disease of native coronary artery without angina pectoris: Secondary | ICD-10-CM | POA: Diagnosis present

## 2019-02-09 DIAGNOSIS — R197 Diarrhea, unspecified: Secondary | ICD-10-CM | POA: Diagnosis present

## 2019-02-09 DIAGNOSIS — R609 Edema, unspecified: Secondary | ICD-10-CM | POA: Diagnosis not present

## 2019-02-09 DIAGNOSIS — I878 Other specified disorders of veins: Secondary | ICD-10-CM | POA: Diagnosis not present

## 2019-02-09 DIAGNOSIS — G9340 Encephalopathy, unspecified: Secondary | ICD-10-CM | POA: Diagnosis present

## 2019-02-09 DIAGNOSIS — Z79899 Other long term (current) drug therapy: Secondary | ICD-10-CM

## 2019-02-09 DIAGNOSIS — Z20822 Contact with and (suspected) exposure to covid-19: Secondary | ICD-10-CM | POA: Diagnosis present

## 2019-02-09 DIAGNOSIS — E869 Volume depletion, unspecified: Secondary | ICD-10-CM | POA: Diagnosis present

## 2019-02-09 DIAGNOSIS — A419 Sepsis, unspecified organism: Secondary | ICD-10-CM | POA: Diagnosis not present

## 2019-02-09 DIAGNOSIS — L97429 Non-pressure chronic ulcer of left heel and midfoot with unspecified severity: Secondary | ICD-10-CM | POA: Diagnosis not present

## 2019-02-09 DIAGNOSIS — E1142 Type 2 diabetes mellitus with diabetic polyneuropathy: Secondary | ICD-10-CM | POA: Diagnosis present

## 2019-02-09 DIAGNOSIS — R109 Unspecified abdominal pain: Secondary | ICD-10-CM | POA: Diagnosis not present

## 2019-02-09 DIAGNOSIS — J9601 Acute respiratory failure with hypoxia: Secondary | ICD-10-CM | POA: Diagnosis not present

## 2019-02-09 DIAGNOSIS — Z79890 Hormone replacement therapy (postmenopausal): Secondary | ICD-10-CM

## 2019-02-09 DIAGNOSIS — Z66 Do not resuscitate: Secondary | ICD-10-CM | POA: Diagnosis not present

## 2019-02-09 DIAGNOSIS — N2581 Secondary hyperparathyroidism of renal origin: Secondary | ICD-10-CM | POA: Diagnosis present

## 2019-02-09 DIAGNOSIS — B9562 Methicillin resistant Staphylococcus aureus infection as the cause of diseases classified elsewhere: Secondary | ICD-10-CM | POA: Diagnosis not present

## 2019-02-09 DIAGNOSIS — Z794 Long term (current) use of insulin: Secondary | ICD-10-CM

## 2019-02-09 DIAGNOSIS — R7881 Bacteremia: Secondary | ICD-10-CM | POA: Diagnosis present

## 2019-02-09 DIAGNOSIS — I482 Chronic atrial fibrillation, unspecified: Secondary | ICD-10-CM

## 2019-02-09 DIAGNOSIS — Z8616 Personal history of COVID-19: Secondary | ICD-10-CM | POA: Diagnosis not present

## 2019-02-09 DIAGNOSIS — T82898A Other specified complication of vascular prosthetic devices, implants and grafts, initial encounter: Secondary | ICD-10-CM | POA: Diagnosis not present

## 2019-02-09 DIAGNOSIS — I82622 Acute embolism and thrombosis of deep veins of left upper extremity: Secondary | ICD-10-CM | POA: Diagnosis present

## 2019-02-09 DIAGNOSIS — E8809 Other disorders of plasma-protein metabolism, not elsewhere classified: Secondary | ICD-10-CM | POA: Diagnosis present

## 2019-02-09 DIAGNOSIS — Z951 Presence of aortocoronary bypass graft: Secondary | ICD-10-CM

## 2019-02-09 DIAGNOSIS — E039 Hypothyroidism, unspecified: Secondary | ICD-10-CM | POA: Diagnosis not present

## 2019-02-09 DIAGNOSIS — Z7401 Bed confinement status: Secondary | ICD-10-CM | POA: Diagnosis not present

## 2019-02-09 DIAGNOSIS — Y712 Prosthetic and other implants, materials and accessory cardiovascular devices associated with adverse incidents: Secondary | ICD-10-CM | POA: Diagnosis present

## 2019-02-09 DIAGNOSIS — E876 Hypokalemia: Secondary | ICD-10-CM | POA: Diagnosis present

## 2019-02-09 DIAGNOSIS — I96 Gangrene, not elsewhere classified: Secondary | ICD-10-CM | POA: Diagnosis not present

## 2019-02-09 DIAGNOSIS — Z515 Encounter for palliative care: Secondary | ICD-10-CM | POA: Diagnosis not present

## 2019-02-09 DIAGNOSIS — R079 Chest pain, unspecified: Secondary | ICD-10-CM

## 2019-02-09 DIAGNOSIS — L0291 Cutaneous abscess, unspecified: Secondary | ICD-10-CM

## 2019-02-09 DIAGNOSIS — Z833 Family history of diabetes mellitus: Secondary | ICD-10-CM

## 2019-02-09 DIAGNOSIS — I248 Other forms of acute ischemic heart disease: Secondary | ICD-10-CM | POA: Diagnosis present

## 2019-02-09 DIAGNOSIS — R195 Other fecal abnormalities: Secondary | ICD-10-CM | POA: Diagnosis not present

## 2019-02-09 DIAGNOSIS — E669 Obesity, unspecified: Secondary | ICD-10-CM | POA: Diagnosis present

## 2019-02-09 DIAGNOSIS — K9423 Gastrostomy malfunction: Secondary | ICD-10-CM | POA: Diagnosis not present

## 2019-02-09 DIAGNOSIS — R1312 Dysphagia, oropharyngeal phase: Secondary | ICD-10-CM

## 2019-02-09 DIAGNOSIS — L89620 Pressure ulcer of left heel, unstageable: Secondary | ICD-10-CM | POA: Diagnosis not present

## 2019-02-09 DIAGNOSIS — I480 Paroxysmal atrial fibrillation: Secondary | ICD-10-CM

## 2019-02-09 DIAGNOSIS — I255 Ischemic cardiomyopathy: Secondary | ICD-10-CM | POA: Diagnosis present

## 2019-02-09 DIAGNOSIS — S91302A Unspecified open wound, left foot, initial encounter: Secondary | ICD-10-CM | POA: Diagnosis not present

## 2019-02-09 DIAGNOSIS — Z4659 Encounter for fitting and adjustment of other gastrointestinal appliance and device: Secondary | ICD-10-CM | POA: Diagnosis not present

## 2019-02-09 DIAGNOSIS — I959 Hypotension, unspecified: Secondary | ICD-10-CM | POA: Diagnosis present

## 2019-02-09 DIAGNOSIS — F329 Major depressive disorder, single episode, unspecified: Secondary | ICD-10-CM | POA: Diagnosis present

## 2019-02-09 DIAGNOSIS — Z9911 Dependence on respirator [ventilator] status: Secondary | ICD-10-CM | POA: Diagnosis not present

## 2019-02-09 DIAGNOSIS — E1165 Type 2 diabetes mellitus with hyperglycemia: Secondary | ICD-10-CM | POA: Diagnosis present

## 2019-02-09 DIAGNOSIS — L84 Corns and callosities: Secondary | ICD-10-CM | POA: Diagnosis not present

## 2019-02-09 DIAGNOSIS — D631 Anemia in chronic kidney disease: Secondary | ICD-10-CM | POA: Diagnosis present

## 2019-02-09 DIAGNOSIS — Z7189 Other specified counseling: Secondary | ICD-10-CM

## 2019-02-09 DIAGNOSIS — I770 Arteriovenous fistula, acquired: Secondary | ICD-10-CM | POA: Diagnosis not present

## 2019-02-09 DIAGNOSIS — R6520 Severe sepsis without septic shock: Secondary | ICD-10-CM | POA: Diagnosis not present

## 2019-02-09 DIAGNOSIS — L89622 Pressure ulcer of left heel, stage 2: Secondary | ICD-10-CM | POA: Diagnosis present

## 2019-02-09 DIAGNOSIS — I499 Cardiac arrhythmia, unspecified: Secondary | ICD-10-CM | POA: Diagnosis not present

## 2019-02-09 DIAGNOSIS — L89612 Pressure ulcer of right heel, stage 2: Secondary | ICD-10-CM | POA: Diagnosis present

## 2019-02-09 DIAGNOSIS — I132 Hypertensive heart and chronic kidney disease with heart failure and with stage 5 chronic kidney disease, or end stage renal disease: Secondary | ICD-10-CM | POA: Diagnosis present

## 2019-02-09 DIAGNOSIS — R251 Tremor, unspecified: Secondary | ICD-10-CM | POA: Diagnosis not present

## 2019-02-09 DIAGNOSIS — R531 Weakness: Secondary | ICD-10-CM | POA: Diagnosis not present

## 2019-02-09 DIAGNOSIS — J9600 Acute respiratory failure, unspecified whether with hypoxia or hypercapnia: Secondary | ICD-10-CM

## 2019-02-09 DIAGNOSIS — F430 Acute stress reaction: Secondary | ICD-10-CM | POA: Diagnosis present

## 2019-02-09 DIAGNOSIS — L8990 Pressure ulcer of unspecified site, unspecified stage: Secondary | ICD-10-CM

## 2019-02-09 DIAGNOSIS — I5032 Chronic diastolic (congestive) heart failure: Secondary | ICD-10-CM | POA: Diagnosis present

## 2019-02-09 DIAGNOSIS — E874 Mixed disorder of acid-base balance: Secondary | ICD-10-CM | POA: Diagnosis not present

## 2019-02-09 DIAGNOSIS — S41111A Laceration without foreign body of right upper arm, initial encounter: Secondary | ICD-10-CM | POA: Diagnosis not present

## 2019-02-09 DIAGNOSIS — Z8619 Personal history of other infectious and parasitic diseases: Secondary | ICD-10-CM | POA: Diagnosis not present

## 2019-02-09 DIAGNOSIS — Z7982 Long term (current) use of aspirin: Secondary | ICD-10-CM

## 2019-02-09 DIAGNOSIS — Z886 Allergy status to analgesic agent status: Secondary | ICD-10-CM

## 2019-02-09 DIAGNOSIS — M62838 Other muscle spasm: Secondary | ICD-10-CM | POA: Diagnosis not present

## 2019-02-09 DIAGNOSIS — Z7901 Long term (current) use of anticoagulants: Secondary | ICD-10-CM

## 2019-02-09 DIAGNOSIS — Z933 Colostomy status: Secondary | ICD-10-CM | POA: Diagnosis not present

## 2019-02-09 DIAGNOSIS — R0600 Dyspnea, unspecified: Secondary | ICD-10-CM

## 2019-02-09 DIAGNOSIS — R633 Feeding difficulties: Secondary | ICD-10-CM | POA: Diagnosis not present

## 2019-02-09 DIAGNOSIS — R627 Adult failure to thrive: Secondary | ICD-10-CM | POA: Diagnosis present

## 2019-02-09 DIAGNOSIS — I9589 Other hypotension: Secondary | ICD-10-CM | POA: Diagnosis present

## 2019-02-09 DIAGNOSIS — E1140 Type 2 diabetes mellitus with diabetic neuropathy, unspecified: Secondary | ICD-10-CM | POA: Diagnosis not present

## 2019-02-09 DIAGNOSIS — T827XXA Infection and inflammatory reaction due to other cardiac and vascular devices, implants and grafts, initial encounter: Secondary | ICD-10-CM | POA: Diagnosis present

## 2019-02-09 DIAGNOSIS — E1122 Type 2 diabetes mellitus with diabetic chronic kidney disease: Secondary | ICD-10-CM

## 2019-02-09 DIAGNOSIS — R404 Transient alteration of awareness: Secondary | ICD-10-CM | POA: Diagnosis not present

## 2019-02-09 DIAGNOSIS — D62 Acute posthemorrhagic anemia: Secondary | ICD-10-CM

## 2019-02-09 DIAGNOSIS — N898 Other specified noninflammatory disorders of vagina: Secondary | ICD-10-CM | POA: Diagnosis not present

## 2019-02-09 DIAGNOSIS — M62830 Muscle spasm of back: Secondary | ICD-10-CM | POA: Diagnosis not present

## 2019-02-09 DIAGNOSIS — T8142XA Infection following a procedure, deep incisional surgical site, initial encounter: Secondary | ICD-10-CM | POA: Diagnosis not present

## 2019-02-09 DIAGNOSIS — Z6834 Body mass index (BMI) 34.0-34.9, adult: Secondary | ICD-10-CM

## 2019-02-09 DIAGNOSIS — N186 End stage renal disease: Secondary | ICD-10-CM | POA: Diagnosis not present

## 2019-02-09 DIAGNOSIS — T827XXD Infection and inflammatory reaction due to other cardiac and vascular devices, implants and grafts, subsequent encounter: Secondary | ICD-10-CM | POA: Diagnosis not present

## 2019-02-09 DIAGNOSIS — I361 Nonrheumatic tricuspid (valve) insufficiency: Secondary | ICD-10-CM | POA: Diagnosis not present

## 2019-02-09 DIAGNOSIS — L89322 Pressure ulcer of left buttock, stage 2: Secondary | ICD-10-CM | POA: Diagnosis present

## 2019-02-09 DIAGNOSIS — R110 Nausea: Secondary | ICD-10-CM | POA: Diagnosis not present

## 2019-02-09 DIAGNOSIS — R1310 Dysphagia, unspecified: Secondary | ICD-10-CM | POA: Diagnosis not present

## 2019-02-09 DIAGNOSIS — I7389 Other specified peripheral vascular diseases: Secondary | ICD-10-CM | POA: Diagnosis present

## 2019-02-09 DIAGNOSIS — E785 Hyperlipidemia, unspecified: Secondary | ICD-10-CM | POA: Diagnosis present

## 2019-02-09 DIAGNOSIS — W06XXXA Fall from bed, initial encounter: Secondary | ICD-10-CM | POA: Diagnosis not present

## 2019-02-09 DIAGNOSIS — R791 Abnormal coagulation profile: Secondary | ICD-10-CM | POA: Diagnosis not present

## 2019-02-09 DIAGNOSIS — Z789 Other specified health status: Secondary | ICD-10-CM

## 2019-02-09 DIAGNOSIS — T45515A Adverse effect of anticoagulants, initial encounter: Secondary | ICD-10-CM | POA: Diagnosis not present

## 2019-02-09 DIAGNOSIS — E46 Unspecified protein-calorie malnutrition: Secondary | ICD-10-CM

## 2019-02-09 DIAGNOSIS — Z8701 Personal history of pneumonia (recurrent): Secondary | ICD-10-CM

## 2019-02-09 DIAGNOSIS — I517 Cardiomegaly: Secondary | ICD-10-CM | POA: Diagnosis not present

## 2019-02-09 DIAGNOSIS — E875 Hyperkalemia: Secondary | ICD-10-CM | POA: Diagnosis not present

## 2019-02-09 DIAGNOSIS — Z931 Gastrostomy status: Secondary | ICD-10-CM | POA: Diagnosis not present

## 2019-02-09 DIAGNOSIS — T82510A Breakdown (mechanical) of surgically created arteriovenous fistula, initial encounter: Secondary | ICD-10-CM | POA: Diagnosis present

## 2019-02-09 DIAGNOSIS — T82838A Hemorrhage of vascular prosthetic devices, implants and grafts, initial encounter: Secondary | ICD-10-CM | POA: Diagnosis not present

## 2019-02-09 DIAGNOSIS — M2550 Pain in unspecified joint: Secondary | ICD-10-CM | POA: Diagnosis not present

## 2019-02-09 DIAGNOSIS — E11649 Type 2 diabetes mellitus with hypoglycemia without coma: Secondary | ICD-10-CM | POA: Diagnosis not present

## 2019-02-09 DIAGNOSIS — Z95828 Presence of other vascular implants and grafts: Secondary | ICD-10-CM | POA: Diagnosis not present

## 2019-02-09 DIAGNOSIS — Z955 Presence of coronary angioplasty implant and graft: Secondary | ICD-10-CM

## 2019-02-09 DIAGNOSIS — I4891 Unspecified atrial fibrillation: Secondary | ICD-10-CM | POA: Diagnosis not present

## 2019-02-09 DIAGNOSIS — Z881 Allergy status to other antibiotic agents status: Secondary | ICD-10-CM

## 2019-02-09 DIAGNOSIS — I120 Hypertensive chronic kidney disease with stage 5 chronic kidney disease or end stage renal disease: Secondary | ICD-10-CM | POA: Diagnosis not present

## 2019-02-09 DIAGNOSIS — Y838 Other surgical procedures as the cause of abnormal reaction of the patient, or of later complication, without mention of misadventure at the time of the procedure: Secondary | ICD-10-CM | POA: Diagnosis not present

## 2019-02-09 DIAGNOSIS — E119 Type 2 diabetes mellitus without complications: Secondary | ICD-10-CM

## 2019-02-09 DIAGNOSIS — Z8742 Personal history of other diseases of the female genital tract: Secondary | ICD-10-CM | POA: Diagnosis not present

## 2019-02-09 DIAGNOSIS — U071 COVID-19: Secondary | ICD-10-CM | POA: Diagnosis not present

## 2019-02-09 DIAGNOSIS — Z978 Presence of other specified devices: Secondary | ICD-10-CM | POA: Diagnosis not present

## 2019-02-09 DIAGNOSIS — L89629 Pressure ulcer of left heel, unspecified stage: Secondary | ICD-10-CM | POA: Diagnosis not present

## 2019-02-09 DIAGNOSIS — K219 Gastro-esophageal reflux disease without esophagitis: Secondary | ICD-10-CM | POA: Diagnosis present

## 2019-02-09 LAB — CBC WITH DIFFERENTIAL/PLATELET
Abs Immature Granulocytes: 0.11 10*3/uL — ABNORMAL HIGH (ref 0.00–0.07)
Basophils Absolute: 0 10*3/uL (ref 0.0–0.1)
Basophils Relative: 0 %
Eosinophils Absolute: 0 10*3/uL (ref 0.0–0.5)
Eosinophils Relative: 0 %
HCT: 34.1 % — ABNORMAL LOW (ref 36.0–46.0)
Hemoglobin: 10.7 g/dL — ABNORMAL LOW (ref 12.0–15.0)
Immature Granulocytes: 1 %
Lymphocytes Relative: 2 %
Lymphs Abs: 0.4 10*3/uL — ABNORMAL LOW (ref 0.7–4.0)
MCH: 26.7 pg (ref 26.0–34.0)
MCHC: 31.4 g/dL (ref 30.0–36.0)
MCV: 85 fL (ref 80.0–100.0)
Monocytes Absolute: 0.4 10*3/uL (ref 0.1–1.0)
Monocytes Relative: 2 %
Neutro Abs: 14.9 10*3/uL — ABNORMAL HIGH (ref 1.7–7.7)
Neutrophils Relative %: 95 %
Platelets: 215 10*3/uL (ref 150–400)
RBC: 4.01 MIL/uL (ref 3.87–5.11)
RDW: 19.8 % — ABNORMAL HIGH (ref 11.5–15.5)
WBC: 15.8 10*3/uL — ABNORMAL HIGH (ref 4.0–10.5)
nRBC: 0 % (ref 0.0–0.2)

## 2019-02-09 LAB — BLOOD GAS, VENOUS
Acid-base deficit: 3 mmol/L — ABNORMAL HIGH (ref 0.0–2.0)
Bicarbonate: 21.1 mmol/L (ref 20.0–28.0)
O2 Saturation: 52.8 %
Patient temperature: 37
pCO2, Ven: 34 mmHg — ABNORMAL LOW (ref 44.0–60.0)
pH, Ven: 7.4 (ref 7.250–7.430)
pO2, Ven: <31 mmHg — CL (ref 32.0–45.0)

## 2019-02-09 LAB — GLUCOSE, CAPILLARY
Glucose-Capillary: 89 mg/dL (ref 70–99)
Glucose-Capillary: 148 mg/dL — ABNORMAL HIGH (ref 70–99)

## 2019-02-09 LAB — LACTIC ACID, PLASMA
Lactic Acid, Venous: 2.6 mmol/L (ref 0.5–1.9)
Lactic Acid, Venous: 1.4 mmol/L (ref 0.5–1.9)

## 2019-02-09 LAB — COMPREHENSIVE METABOLIC PANEL
ALT: 40 U/L (ref 0–44)
AST: 92 U/L — ABNORMAL HIGH (ref 15–41)
Albumin: 2.1 g/dL — ABNORMAL LOW (ref 3.5–5.0)
Alkaline Phosphatase: 105 U/L (ref 38–126)
Anion gap: 18 — ABNORMAL HIGH (ref 5–15)
BUN: 16 mg/dL (ref 8–23)
CO2: 23 mmol/L (ref 22–32)
Calcium: 8.8 mg/dL — ABNORMAL LOW (ref 8.9–10.3)
Chloride: 99 mmol/L (ref 98–111)
Creatinine, Ser: 5.04 mg/dL — ABNORMAL HIGH (ref 0.44–1.00)
GFR calc Af Amer: 10 mL/min — ABNORMAL LOW (ref >60)
GFR calc non Af Amer: 8 mL/min — ABNORMAL LOW (ref >60)
Glucose, Bld: 101 mg/dL — ABNORMAL HIGH (ref 70–99)
Potassium: 3.7 mmol/L (ref 3.5–5.1)
Sodium: 140 mmol/L (ref 135–145)
Total Bilirubin: 1.3 mg/dL — ABNORMAL HIGH (ref 0.3–1.2)
Total Protein: 6.1 g/dL — ABNORMAL LOW (ref 6.5–8.1)

## 2019-02-09 LAB — HEMOGLOBIN A1C
Hgb A1c MFr Bld: 5.3 % (ref 4.8–5.6)
Mean Plasma Glucose: 105.41 mg/dL

## 2019-02-09 LAB — PROTIME-INR
INR: 1.1 (ref 0.8–1.2)
Prothrombin Time: 13.8 seconds (ref 11.4–15.2)

## 2019-02-09 LAB — TROPONIN I (HIGH SENSITIVITY)
Troponin I (High Sensitivity): 60 ng/L — ABNORMAL HIGH (ref <18)
Troponin I (High Sensitivity): 62 ng/L — ABNORMAL HIGH (ref <18)

## 2019-02-09 LAB — TSH: TSH: 0.189 u[IU]/mL — ABNORMAL LOW (ref 0.350–4.500)

## 2019-02-09 LAB — AMMONIA: Ammonia: <9 umol/L — ABNORMAL LOW (ref 9–35)

## 2019-02-09 LAB — LIPASE, BLOOD: Lipase: 16 U/L (ref 11–51)

## 2019-02-09 LAB — ETHANOL: Alcohol, Ethyl (B): <10 mg/dL (ref <10)

## 2019-02-09 LAB — T4, FREE: Free T4: 2.29 ng/dL — ABNORMAL HIGH (ref 0.61–1.12)

## 2019-02-09 MED ORDER — PIPERACILLIN-TAZOBACTAM 3.375 G IVPB
3.3750 g | Freq: Two times a day (BID) | INTRAVENOUS | Status: DC
  Administered 2019-02-10: 01:00:00 3.375 g via INTRAVENOUS
  Filled 2019-02-09 (×2): qty 50

## 2019-02-09 MED ORDER — VANCOMYCIN HCL 500 MG IV SOLR: 500.0000 mg | INTRAVENOUS | Status: DC

## 2019-02-09 MED ORDER — INSULIN ASPART 100 UNIT/ML ~~LOC~~ SOLN
0.0000 [IU] | Freq: Three times a day (TID) | SUBCUTANEOUS | Status: DC
  Administered 2019-02-09 – 2019-02-11 (×3): 1 [IU] via SUBCUTANEOUS
  Administered 2019-02-11: 2 [IU] via SUBCUTANEOUS
  Administered 2019-02-15: 17:00:00 3 [IU] via SUBCUTANEOUS
  Administered 2019-02-16 – 2019-02-19 (×6): 2 [IU] via SUBCUTANEOUS
  Administered 2019-02-19 – 2019-02-20 (×2): 1 [IU] via SUBCUTANEOUS
  Administered 2019-02-21 (×2): 2 [IU] via SUBCUTANEOUS
  Administered 2019-02-22: 1 [IU] via SUBCUTANEOUS
  Administered 2019-02-22: 3 [IU] via SUBCUTANEOUS
  Administered 2019-02-22: 1 [IU] via SUBCUTANEOUS
  Administered 2019-02-23 – 2019-02-26 (×3): 2 [IU] via SUBCUTANEOUS
  Administered 2019-02-27: 3 [IU] via SUBCUTANEOUS
  Administered 2019-02-27: 2 [IU] via SUBCUTANEOUS
  Administered 2019-02-27: 3 [IU] via SUBCUTANEOUS
  Administered 2019-02-28: 9 [IU] via SUBCUTANEOUS
  Filled 2019-02-09 (×26): qty 1

## 2019-02-09 MED ORDER — FLUOXETINE HCL 20 MG PO CAPS
20.0000 mg | ORAL_CAPSULE | Freq: Every day | ORAL | Status: DC
  Filled 2019-02-09 (×3): qty 1

## 2019-02-09 MED ORDER — HEPARIN SODIUM (PORCINE) 5000 UNIT/ML IJ SOLN
5000.0000 [IU] | Freq: Three times a day (TID) | INTRAMUSCULAR | Status: DC
  Administered 2019-02-10 – 2019-02-13 (×9): 5000 [IU] via SUBCUTANEOUS
  Filled 2019-02-09 (×9): qty 1

## 2019-02-09 MED ORDER — VANCOMYCIN VARIABLE DOSE PER UNSTABLE RENAL FUNCTION (PHARMACIST DOSING): Status: DC

## 2019-02-09 MED ORDER — FUROSEMIDE 40 MG PO TABS: 40.0000 mg | ORAL_TABLET | Freq: Every day | ORAL | Status: DC | PRN

## 2019-02-09 MED ORDER — ASPIRIN EC 81 MG PO TBEC: 81.0000 mg | DELAYED_RELEASE_TABLET | Freq: Every day | ORAL | Status: DC

## 2019-02-09 MED ORDER — FLUTICASONE PROPIONATE 50 MCG/ACT NA SUSP
2.0000 | Freq: Every day | NASAL | Status: DC
  Administered 2019-02-15 – 2019-04-19 (×43): 2 via NASAL
  Filled 2019-02-09 (×3): qty 16

## 2019-02-09 MED ORDER — METRONIDAZOLE IN NACL 5-0.79 MG/ML-% IV SOLN
500.0000 mg | Freq: Once | INTRAVENOUS | Status: AC
  Administered 2019-02-09: 500 mg via INTRAVENOUS
  Filled 2019-02-09: qty 100

## 2019-02-09 MED ORDER — VITAMIN C 500 MG PO TABS
500.0000 mg | ORAL_TABLET | Freq: Every day | ORAL | Status: DC
  Filled 2019-02-09: qty 1

## 2019-02-09 MED ORDER — APIXABAN 2.5 MG PO TABS: 2.5000 mg | ORAL_TABLET | Freq: Two times a day (BID) | ORAL | Status: DC

## 2019-02-09 MED ORDER — SODIUM CHLORIDE 0.9% FLUSH: 3.0000 mL | Freq: Once | INTRAVENOUS | Status: DC

## 2019-02-09 MED ORDER — ATORVASTATIN CALCIUM 20 MG PO TABS: 80.0000 mg | ORAL_TABLET | Freq: Every day | ORAL | Status: DC

## 2019-02-09 MED ORDER — ONDANSETRON HCL 4 MG/2ML IJ SOLN
4.0000 mg | Freq: Four times a day (QID) | INTRAMUSCULAR | Status: DC | PRN
  Administered 2019-02-18 – 2019-04-20 (×27): 4 mg via INTRAVENOUS
  Filled 2019-02-09 (×27): qty 2

## 2019-02-09 MED ORDER — VENLAFAXINE HCL 37.5 MG PO TABS
75.0000 mg | ORAL_TABLET | Freq: Two times a day (BID) | ORAL | Status: DC
  Filled 2019-02-09 (×5): qty 2

## 2019-02-09 MED ORDER — FERROUS SULFATE 325 (65 FE) MG PO TABS: 325.0000 mg | ORAL_TABLET | Freq: Every day | ORAL | Status: DC

## 2019-02-09 MED ORDER — ATORVASTATIN CALCIUM 20 MG PO TABS: 40.0000 mg | ORAL_TABLET | Freq: Every day | ORAL | Status: DC

## 2019-02-09 MED ORDER — MIDODRINE HCL 5 MG PO TABS: 10.0000 mg | ORAL_TABLET | Freq: Three times a day (TID) | ORAL | Status: DC

## 2019-02-09 MED ORDER — ZINC SULFATE 220 (50 ZN) MG PO CAPS
220.0000 mg | ORAL_CAPSULE | Freq: Every day | ORAL | Status: DC
  Filled 2019-02-09: qty 1

## 2019-02-09 MED ORDER — SODIUM CHLORIDE 0.9 % IV SOLN
2.0000 g | Freq: Once | INTRAVENOUS | Status: AC
  Administered 2019-02-09: 16:00:00 2 g via INTRAVENOUS
  Filled 2019-02-09: qty 2

## 2019-02-09 MED ORDER — MIRTAZAPINE 15 MG PO TABS: 15.0000 mg | ORAL_TABLET | Freq: Every day | ORAL | Status: DC

## 2019-02-09 MED ORDER — ALIROCUMAB 75 MG/ML ~~LOC~~ SOAJ: 75.0000 mg | SUBCUTANEOUS | Status: DC

## 2019-02-09 MED ORDER — LACTATED RINGERS IV BOLUS
1000.0000 mL | Freq: Once | INTRAVENOUS | Status: AC
  Administered 2019-02-09: 1000 mL via INTRAVENOUS

## 2019-02-09 MED ORDER — AMIODARONE HCL 200 MG PO TABS: 200.0000 mg | ORAL_TABLET | Freq: Every day | ORAL | Status: DC

## 2019-02-09 MED ORDER — VANCOMYCIN HCL IN DEXTROSE 1-5 GM/200ML-% IV SOLN
1000.0000 mg | Freq: Once | INTRAVENOUS | Status: AC
  Administered 2019-02-09: 16:00:00 1000 mg via INTRAVENOUS
  Filled 2019-02-09: qty 200

## 2019-02-09 MED ORDER — PANTOPRAZOLE SODIUM 40 MG PO TBEC: 40.0000 mg | DELAYED_RELEASE_TABLET | Freq: Every day | ORAL | Status: DC

## 2019-02-09 MED ORDER — LEVOTHYROXINE SODIUM 88 MCG PO TABS
88.0000 ug | ORAL_TABLET | Freq: Every day | ORAL | Status: DC
  Filled 2019-02-09 (×2): qty 1

## 2019-02-09 MED ORDER — LEVOTHYROXINE SODIUM 50 MCG PO TABS: 50.0000 ug | ORAL_TABLET | Freq: Every day | ORAL | Status: DC

## 2019-02-09 NOTE — ED Triage Notes (Addendum)
Pt presents to ED via EMS from  home, pt is unable to state where she lives. Pt repeatedly states, "I'm cold, I'm cold". Pt denies abdominal pain while in triage. Pt repeatedly moaning at this time. Unable to obtain hx from patient at this time. Pt with hx of ESRD, fistula to R upper arm.   Pt now c/o abdominal pain in triage. Pt unable to state where the pain is, for how long. Pt once again noted to be poor historian. Pt repeatedly states "I'm cold, I'm cold", pt is alert, however is unable to answer most of the triage questions at this time.   Pt now c/o CP at this time.

## 2019-02-09 NOTE — Progress Notes (Signed)
Pharmacy Antibiotic Note  Jamie Arias is a 63 y.o. female admitted on 02/09/2019. Patient c/o being cold, unable to answer most questions at triage. Patient also complaining of abdominal pain and chest pain. Pharmacy has been consulted for vancomycin and Zosyn dosing.  Patient with ESRD on HD. Appears she is a MWF schedule.  Plan: Vancomycin 500 mg IV MWF w/ dialysis. Per admitting note, patient missed dialysis today but does not appear volume overloaded. Plan to consult nephrology in the morning. May need to adjust vancomycin dosing schedule based on plan for dialysis.  Zosyn 3.375 g IV extended infusion q12h  Height: 5' (152.4 cm) Weight: 130 lb (59 kg) IBW/kg (Calculated) : 45.5  Temp (24hrs), Avg:98.3 F (36.8 C), Min:98.2 F (36.8 C), Max:98.3 F (36.8 C)  Recent Labs  Lab 02/09/19 1143 02/09/19 1438  WBC 15.8*  --   CREATININE 5.04*  --   LATICACIDVEN  --  2.6*    Estimated Creatinine Clearance: 8.9 mL/min (A) (by C-G formula based on SCr of 5.04 mg/dL (H)).    Allergies  Allergen Reactions  . Nsaids Other (See Comments)    Contraindicated due to kidney disease.  Marland Kitchen Doxycycline Other (See Comments)    tremor    Antimicrobials this admission: Cefepime 12/7 x 1 Vancomycin 12/7 >> Zosyn 12/7 >>  Dose adjustments this admission: NA  Microbiology results: 12/7 BCx: pending  Thank you for allowing pharmacy to be a part of this patient's care.  Tawnya Crook, PharmD 02/09/2019 5:19 PM

## 2019-02-09 NOTE — Progress Notes (Signed)
CODE SEPSIS - PHARMACY COMMUNICATION  **Broad Spectrum Antibiotics should be administered within 1 hour of Sepsis diagnosis**  Time Code Sepsis Called/Page Received: 1446  Antibiotics Ordered: vanc/cefepime/Flagyl  Time of 1st antibiotic administration: 1540  Additional action taken by pharmacy: spoke with RN outside of room for 10 min remaining on code sepsis, response "we'll get there"    Tawnya Crook ,PharmD Clinical Pharmacist  02/09/2019  3:39 PM

## 2019-02-09 NOTE — ED Notes (Signed)
This RN spoke with Dr. Kerman Passey regarding patient condition.

## 2019-02-09 NOTE — ED Notes (Signed)
Lab called and spoke with Nira Conn, RN states needs recollect of green top, Nira Conn, RN explained lab tech stuck patient initially due to patient being difficult stick, per Nira Conn, RN lab states will come recollect green top.

## 2019-02-09 NOTE — ED Notes (Signed)
fsbs 148

## 2019-02-09 NOTE — ED Triage Notes (Signed)
Pt in via EMS from home with c/o abd pain. EMS reports pts other half reports pt also has altered mental status. BP 110/78, FSBS 146, O2, 97%, Co2 19, 26RR. EMS reports pt is due for dialysis today

## 2019-02-09 NOTE — ED Provider Notes (Signed)
[facility] Emergency Department Provider Note   ____________________________________________   First MD Initiated Contact with Patient 02/09/19 1402     (approximate)  I have reviewed the triage vital signs and the nursing notes.   HISTORY  Chief Complaint Altered Mental Status    HPI Jamie Arias is a 63 y.o. female with past medical history of ESRD on HD, CAD, diabetes, diastolic CHF, diabetes, and hypothyroidism presents to the ED for altered mental status.  History is limited secondary to patient's altered mental status.  She is unable to significantly participate in evaluation and when asked what is bothering her she will only states "I am okay" and then not answer any additional questions.  She was noted by nursing staff to be repeating "I am cold, I am cold".  EMS stated that both she and her significant other were noted to be confused at their home.  She reportedly received dialysis without difficulty 3 days ago and was due for dialysis again today.  She shakes her head no when I asked her if she is in any pain.  She reportedly does not have any alteration to her mental status at baseline.        Past Medical History:  Diagnosis Date   Anemia    Chronic diastolic CHF (congestive heart failure) ([place])    a. Due to ischemic cardiomyopathy. EF as low as 35%, improved to normal s/p CABG; b. echo 07/06/13: EF 55-60%, no RWMAs, mod TR, trivial pericardial effusion not c/w tamponade physiology;  c. 10/2015 Echo: EF 65%, Gr1 DD, triv AI, mild MR, mildly dil LA, mod TR, PASP 47mHg.   Coronary artery disease    a. NSTEMI 06/2013; b.cath: severe three-vessel CAD w/ EF 30% & mild-mod MR; c. s/p 3 vessel CABG 07/02/13 (LIMA-LAD, SVG-OM, and SVG-RPDA);  d. 10/2015 MV: no ischemia/infarct.   Diabetes mellitus without complication ([place]    Diabetic neuropathy ([place]    Dialysis patient ([place]    MWF   ESRD (end stage renal disease) ([place]    a. 12/2015 initiated  - mwf dialysis.   GERD (gastroesophageal reflux disease)    Hyperlipidemia    Hypertension    Hypothyroidism    Myocardial infarction ([facility] 2015   Neuropathy    Pleural effusion 2015   Pulmonary hypertension ([place]    Wears dentures    full lower    Patient Active Problem List   Diagnosis Date Noted   Altered level of consciousness 02/09/2019   Adjustment disorder with depressed mood 08/31/2018   COVID-19 virus infection 08/24/2018   Acute on chronic respiratory failure with hypoxia ([place] 08/24/2018   UTI (urinary tract infection) 08/24/2018   Allergic rhinitis 09/19/2017   Uremia of renal origin 09/04/2017   Chronic neck pain 07/25/2017   Hyperkalemia 129/93/7169  Complication from renal dialysis device 12/01/2016   ESRD (end stage renal disease) ([facility] 01/19/2016   GI bleed 10/25/2015   Hypertensive heart disease 10/24/2015   Chronic diastolic CHF (congestive heart failure) ([place] 10/24/2015   Unstable angina ([facility] 10/23/2015   Low BP 067/89/3810  Chronic systolic CHF (congestive heart failure) ([place] 09/02/2015   Depression 07/27/2015   Calculus of gallbladder with chronic cholecystitis without obstruction    Bilateral carotid bruits 11/30/2014   Low magnesium levels 08/10/2013   Anemia 08/10/2013   Constipation 08/10/2013   Postoperative atrial fibrillation ([place] 07/30/2013   Coronary artery disease    CAD (coronary artery disease) 07/02/2013   Acute  systolic heart failure ([place]) 06/30/2013   NSTEMI (non-ST elevated myocardial infarction) ([place]) 06/29/2013   Vertigo 08/25/2012   Sleep disorder 05/23/2012   Hypothyroid 04/27/2012   HTN (hypertension) 04/27/2012   HLD (hyperlipidemia) 04/27/2012   Diabetes mellitus, type 2 ([place]) 04/27/2012   Neuropathy 04/27/2012    Past Surgical History:  Procedure Laterality Date   A/V FISTULAGRAM Right 12/17/2016   Procedure: A/V Fistulagram;  Surgeon: Algernon Huxley, MD;  Location: [place] [facility];  Service: Cardiovascular;  Laterality: Right;   A/V FISTULAGRAM Right 01/07/2017   Procedure: A/V Fistulagram;  Surgeon: Algernon Huxley, MD;  Location: [place] [facility];  Service: Cardiovascular;  Laterality: Right;   A/V FISTULAGRAM Right 12/03/2017   Procedure: A/V FISTULAGRAM;  Surgeon: Katha Cabal, MD;  Location: [place] [facility];  Service: Cardiovascular;  Laterality: Right;   A/V SHUNT INTERVENTION N/A 02/22/2017   Procedure: A/V SHUNT INTERVENTION;  Surgeon: Algernon Huxley, MD;  Location: [place] [facility];  Service: Cardiovascular;  Laterality: N/A;   AV FISTULA PLACEMENT Right 02/03/2016   Procedure: INSERTION OF ARTERIOVENOUS (AV) GORE-TEX GRAFT ARM ( BRACH / AXILLARY );  Surgeon: Katha Cabal, MD;  Location: [facility];  Service: Vascular;  Laterality: Right;   CARDIAC CATHETERIZATION     CATARACT EXTRACTION Bilateral    CHOLECYSTECTOMY N/A 12/09/2014   Procedure: LAPAROSCOPIC CHOLECYSTECTOMY;  Surgeon: Marlyce [name], MD;  Location: [facility];  Service: General;  Laterality: N/A;   CORONARY ARTERY BYPASS GRAFT N/A 07/02/2013   Procedure: CORONARY ARTERY BYPASS GRAFTING (CABG);  Surgeon: Ivin [name], MD;  Location: [place];  Service: Open Heart Surgery;  Laterality: N/A;  CABG x three, using left internal mammary artery and right leg greater saphenous vein harvested endoscopically   ESOPHAGOGASTRODUODENOSCOPY (EGD) WITH PROPOFOL N/A 11/24/2015   Procedure: ESOPHAGOGASTRODUODENOSCOPY (EGD) WITH PROPOFOL;  Surgeon: [name], MD;  Location: [place];  Service: Endoscopy;  Laterality: N/A;  Diabetic - insulin   EYE SURGERY Bilateral    Cataract Extraction with IOL   INTRAOPERATIVE TRANSESOPHAGEAL ECHOCARDIOGRAM N/A 07/02/2013   Procedure: INTRAOPERATIVE TRANSESOPHAGEAL ECHOCARDIOGRAM;  Surgeon: Ivin [name], MD;  Location: [place];  Service: Open Heart Surgery;  Laterality: N/A;   PERIPHERAL VASCULAR CATHETERIZATION Right  12/06/2015   Procedure: Dialysis/Perma Catheter Insertion;  Surgeon: Katha Cabal, MD;  Location: [place] [facility];  Service: Cardiovascular;  Laterality: Right;   PERIPHERAL VASCULAR THROMBECTOMY Right 09/28/2016   Procedure: Peripheral Vascular Thrombectomy;  Surgeon: Katha Cabal, MD;  Location: [place] [facility];  Service: Cardiovascular;  Laterality: Right;   PERIPHERAL VASCULAR THROMBECTOMY Right 05/20/2018   Procedure: PERIPHERAL VASCULAR THROMBECTOMY;  Surgeon: Katha Cabal, MD;  Location: [place] [facility];  Service: Cardiovascular;  Laterality: Right;   PORTA CATH REMOVAL N/A 06/01/2016   Procedure: Glori [name] Cath Removal;  Surgeon: Katha Cabal, MD;  Location: [place] [facility];  Service: Cardiovascular;  Laterality: N/A;   THORACENTESIS Left 2015    Prior to Admission medications   Medication Sig Start Date End Date Taking? Authorizing Provider  ACCU-CHEK FASTCLIX LANCETS MISC Check blood glucose twice daily, diagnosis code E11.9 12/13/16   [name], MD  acetaminophen (TYLENOL) 325 MG tablet Take 650 mg by mouth daily as needed for moderate pain or headache.     [provider]  Alirocumab (PRALUENT) 75 MG/ML SOAJ Inject 75 mg into the skin every 14 (fourteen) days. 04/04/18   Wellington Hampshire, MD  Amino Acids-Protein Hydrolys (FEEDING SUPPLEMENT, PRO-STAT SUGAR FREE 64,) LIQD Take 30 mLs by mouth 3 (three) times daily between meals. 09/09/18   Hosie Poisson, MD  amiodarone (PACERONE) 200 MG tablet Take 1 tablet (200 mg total) by mouth 2 (two) times daily for 4 days, THEN 1 tablet (200 mg total) daily. 09/15/18 12/18/18  Mercy Riding, MD  aspirin EC 81 MG tablet Take 81 mg by mouth daily.    [provider]  atorvastatin (LIPITOR) 80 MG tablet Take 1 tablet (80 mg total) by mouth daily at 6 PM. 07/11/18   Wellington Hampshire, MD  blood glucose meter kit and supplies KIT Dispense based on patient and insurance preference. Use up to  twice daily. (FOR ICD-10 E11.9) 07/01/15   Rubbie Battiest, RN  Blood Glucose Monitoring Suppl (ACCU-CHEK AVIVA PLUS) w/Device KIT Use as directed to test blood sugar up to three times daily 05/06/17   [name], MD  Calcium Acetate 668 (169 Ca) MG TABS Take 2,004 mg by mouth 3 (three) times daily with meals.  09/04/17   [provider]  ELIQUIS 5 MG TABS tablet  01/20/19   [provider]  ergocalciferol (VITAMIN D2) 50000 units capsule Take 50,000 Units by mouth once a week.     [provider]  ferrous sulfate 325 (65 FE) MG tablet Take 325 mg by mouth daily with breakfast.    [provider]  fluticasone (FLONASE) 50 MCG/ACT nasal spray Place 2 sprays into both nostrils daily. 09/17/17   [name], MD  furosemide (LASIX) 20 MG tablet Take 40 mg by mouth every other day. Non-dialysis days     [provider]  gabapentin (NEURONTIN) 300 MG capsule  01/20/19   [provider]  glucose blood (ACCU-CHEK AVIVA) test strip Use as instructed to test blood sugar up to 3 times daily 05/06/17   [name], MD  insulin aspart (NOVOLOG) 100 UNIT/ML injection Inject 0-9 Units into the skin 3 (three) times daily with meals. 09/11/18   Mercy Riding, MD  insulin aspart (NOVOLOG) 100 UNIT/ML injection Inject 0-5 Units into the skin at bedtime. 09/11/18   Mercy Riding, MD  insulin glargine (LANTUS) 100 UNIT/ML injection Inject 0.1 mLs (10 Units total) into the skin daily. If CBG greater than 150 consistently 09/11/18   Wendee Beavers T, MD  Lancets (ACCU-CHEK SOFT TOUCH) lancets Use as instructed 05/06/17   [name], MD  Lancets Misc. (ACCU-CHEK SOFTCLIX LANCET DEV) KIT Use as directed to test blood sugar up to three times daily 05/06/17   [name], MD  levothyroxine (SYNTHROID) 50 MCG tablet Take 1 tablet (50 mcg total) by mouth daily at 6 (six) AM. 09/10/18   Hosie Poisson, MD  lidocaine-prilocaine (EMLA) cream Apply 1 application  topically as needed (port access).    [provider]  midodrine (PROAMATINE) 10 MG tablet Take 1 tablet (10 mg total) by mouth 3 (three) times daily with meals. 09/09/18   Hosie Poisson, MD  mirtazapine (REMERON) 15 MG tablet Take 1 tablet (15 mg total) by mouth at bedtime. 09/11/18   Mercy Riding, MD  multivitamin (RENA-VIT) TABS tablet Take 1 tablet by mouth at bedtime. 09/09/18   Hosie Poisson, MD  nitroGLYCERIN (NITROSTAT) 0.4 MG SL tablet DISSOLVE ONE TABLET UNDER THE TONGUE EVERY 5 MINUTES AS NEEDED FOR CHEST PAIN.  DO NOT EXCEED A TOTAL OF 3 DOSES IN 15 MINUTES Patient taking differently: Place  0.4 mg under the tongue every 5 (five) minutes as needed for chest pain. DISSOLVE ONE TABLET UNDER THE TONGUE EVERY 5 MINUTES AS NEEDED FOR CHEST PAIN.  DO NOT EXCEED A TOTAL OF 3 DOSES IN 15 MINUTES 08/18/18   Wellington Hampshire, MD  omeprazole (PRILOSEC) 20 MG capsule Take 20 mg by mouth 2 (two) times daily before a meal.    [provider]  ondansetron (ZOFRAN-ODT) 4 MG disintegrating tablet Take 1 tablet (4 mg total) by mouth every 8 (eight) hours as needed for nausea or vomiting. appt with PCP further refills Dr. Caryl Bis 01/28/19   McLean-Scocuzza, Nino Glow, MD  pantoprazole (PROTONIX) 40 MG tablet Take 40 mg by mouth daily. 01/20/19   [provider]  polyethylene glycol (MIRALAX / GLYCOLAX) 17 g packet Take 17 g by mouth daily. 09/10/18   Hosie Poisson, MD  venlafaxine (EFFEXOR) 75 MG tablet Take 1 tablet (75 mg total) by mouth 2 (two) times daily with a meal. 09/12/18   Mercy Riding, MD  vitamin C (VITAMIN C) 500 MG tablet Take 1 tablet (500 mg total) by mouth daily. 09/10/18   Hosie Poisson, MD  zinc sulfate 220 (50 Zn) MG capsule Take 1 capsule (220 mg total) by mouth daily. 09/10/18   Hosie Poisson, MD    Allergies Nsaids and Doxycycline  Family History  Problem Relation Age of Onset   COPD Mother    Cancer Mother        Lung   Pulmonary embolism Father     Diabetes Father    Diabetes Paternal Grandfather    Heart disease Maternal Grandmother    Colon cancer Neg Hx    Colon polyps Neg Hx    Esophageal cancer Neg Hx    Pancreatic cancer Neg Hx    Liver disease Neg Hx     Social History Social History   Tobacco Use   Smoking status: Never Smoker   Smokeless tobacco: Never Used  Substance Use Topics   Alcohol use: No    Alcohol/week: 0.0 standard drinks   Drug use: No    Review of Systems Unable to obtain secondary to altered mental status  ____________________________________________   PHYSICAL EXAM:  VITAL SIGNS: ED Triage Vitals  Enc Vitals Group     BP 02/09/19 1135 (!) 90/47     Pulse Rate 02/09/19 1135 100     Resp 02/09/19 1135 (!) 24     Temp 02/09/19 1135 98.2 F (36.8 C)     Temp Source 02/09/19 1135 Oral     SpO2 02/09/19 1135 100 %     Weight 02/09/19 1137 130 lb (59 kg)     Height 02/09/19 1137 5' (1.524 m)     Head Circumference --      Peak Flow --      Pain Score --      Pain Loc --      Pain Edu? --      Excl. in [place]? --     Constitutional: Alert and disoriented. Eyes: Conjunctivae are normal. Head: Atraumatic. Nose: No congestion/rhinnorhea. Mouth/Throat: Mucous membranes are very dry. Neck: Normal ROM Cardiovascular: Normal rate, regular rhythm. Grossly normal heart sounds.  Right upper extremity AV fistula with no erythema, warmth, or tenderness.  Palpable thrill noted. Respiratory: Normal respiratory effort.  No retractions. Lungs CTAB. Gastrointestinal: Soft and nontender. No distention. Genitourinary: deferred Musculoskeletal: No lower extremity tenderness nor edema. Neurologic: Delayed speech and difficulty following commands. No gross focal  neurologic deficits are appreciated, she is moving all extremities equally but is overall slow to follow commands. Skin:  Skin is warm, dry and intact. No rash noted. Psychiatric: Mood and affect are normal. Speech and behavior are  normal.  ____________________________________________   LABS (all labs ordered are listed, but only abnormal results are displayed)  Labs Reviewed  COMPREHENSIVE METABOLIC PANEL - Abnormal; Notable for the following components:      Result Value   Glucose, Bld 101 (*)    Creatinine, Ser 5.04 (*)    Calcium 8.8 (*)    Total Protein 6.1 (*)    Albumin 2.1 (*)    AST 92 (*)    Total Bilirubin 1.3 (*)    GFR calc non Af Amer 8 (*)    GFR calc Af Amer 10 (*)    Anion gap 18 (*)    All other components within normal limits  CBC WITH DIFFERENTIAL/PLATELET - Abnormal; Notable for the following components:   WBC 15.8 (*)    Hemoglobin 10.7 (*)    HCT 34.1 (*)    RDW 19.8 (*)    Neutro Abs 14.9 (*)    Lymphs Abs 0.4 (*)    Abs Immature Granulocytes 0.11 (*)    All other components within normal limits  LACTIC ACID, PLASMA - Abnormal; Notable for the following components:   Lactic Acid, Venous 2.6 (*)    All other components within normal limits  TSH - Abnormal; Notable for the following components:   TSH 0.189 (*)    All other components within normal limits  T4, FREE - Abnormal; Notable for the following components:   Free T4 2.29 (*)    All other components within normal limits  TROPONIN I (HIGH SENSITIVITY) - Abnormal; Notable for the following components:   Troponin I (High Sensitivity) 60 (*)    All other components within normal limits  CULTURE, BLOOD (ROUTINE X 2)  CULTURE, BLOOD (ROUTINE X 2)  URINE CULTURE  SARS CORONAVIRUS 2 (TAT 6-24 HRS)  GLUCOSE, CAPILLARY  PROTIME-INR  LIPASE, BLOOD  LACTIC ACID, PLASMA  URINALYSIS, ROUTINE W REFLEX MICROSCOPIC  COOXEMETRY PANEL  BLOOD GAS, VENOUS  HEMOGLOBIN A1C  CBG MONITORING, ED   ____________________________________________  EKG  ED ECG REPORT I, Blake Divine, the attending physician, personally viewed and interpreted this ECG.   Date: 02/09/2019  EKG Time: 11:43  Rate: 97  Rhythm: normal sinus rhythm   Axis: Normal  Intervals:none  ST&T Change: None   PROCEDURES  Procedure(s) performed (including Critical Care):  Procedures   ____________________________________________   INITIAL IMPRESSION / ASSESSMENT AND PLAN / ED COURSE       63 year old female with history of ESRD on HD presents to the ED after being found altered from her baseline mental status.  She has no focal neurologic deficits on exam but is slow to respond and has difficulty following commands, will state only "I am okay" or "I am cold".  CT head is negative for acute process.  There is concern for sepsis given her tachycardia, tachypnea, and leukocytosis.  There is no clear source and it is not clear to me whether patient makes urine, will cover with broad-spectrum antibiotics.  She has no abdominal tenderness on exam and her fistula site appears intact with no erythema, warmth, or tenderness.  Differential for her altered mental status would also include uremia, hypothyroidism, carbon oxide poisoning.  Thyroid studies and cooximetry are pending and patient is due for dialysis today.  She will require admission for further altered mental status work-up, case discussed with hospitalist who accepts patient for admission.      ____________________________________________   FINAL CLINICAL IMPRESSION(S) / ED DIAGNOSES  Final diagnoses:  Altered mental status, unspecified altered mental status type  Sepsis, due to unspecified organism, unspecified whether acute organ dysfunction present ([place])  ESRD (end stage renal disease) [facility])     ED Discharge Orders    None       Note:  This document was prepared using Dragon voice recognition software and may include unintentional dictation errors.   Blake Divine, MD 02/09/19 [phone]

## 2019-02-09 NOTE — Progress Notes (Signed)
[place] for apixaban Indication: atrial fibrillation  Allergies  Allergen Reactions  . Nsaids Other (See Comments)    Contraindicated due to kidney disease.  Marland Kitchen Doxycycline Other (See Comments)    tremor   Labs: Recent Labs    02/09/19 1143 02/09/19 1315  HGB 10.7*  --   HCT 34.1*  --   PLT 215  --   LABPROT 13.8  --   INR 1.1  --   CREATININE 5.04*  --   TROPONINIHS  --  60*    Estimated Creatinine Clearance: 8.9 mL/min (A) (by C-G formula based on SCr of 5.04 mg/dL (H)).   Medical History: Past Medical History:  Diagnosis Date  . Anemia   . Chronic diastolic CHF (congestive heart failure) ([place])    a. Due to ischemic cardiomyopathy. EF as low as 35%, improved to normal s/p CABG; b. echo 07/06/13: EF 55-60%, no RWMAs, mod TR, trivial pericardial effusion not c/w tamponade physiology;  c. 10/2015 Echo: EF 65%, Gr1 DD, triv AI, mild MR, mildly dil LA, mod TR, PASP 86mmHg.  Marland Kitchen Coronary artery disease    a. NSTEMI 06/2013; b.cath: severe three-vessel CAD w/ EF 30% & mild-mod MR; c. s/p 3 vessel CABG 07/02/13 (LIMA-LAD, SVG-OM, and SVG-RPDA);  d. 10/2015 MV: no ischemia/infarct.  . Diabetes mellitus without complication ([place])   . Diabetic neuropathy ([place])   . Dialysis patient [facility])    MWF  . ESRD (end stage renal disease) ([place])    a. 12/2015 initiated - mwf dialysis.  Marland Kitchen GERD (gastroesophageal reflux disease)   . Hyperlipidemia   . Hypertension   . Hypothyroidism   . Myocardial infarction ([place]) 2015  . Neuropathy   . Pleural effusion 2015  . Pulmonary hypertension ([place])   . Wears dentures    full lower    Assessment: 63 year old female with h/o afib on Eliquis PTA. Patient's home dose reportedly 5 mg BID. Patient with ESRD on HD. Pharmacy consulted to continue Eliquis inpatient.   Goal of Therapy:  Monitor platelets by anticoagulation protocol: Yes   Plan:  Apixaban 2.5 mg BID (dose reduced for weight < 60 kg and SCr > 1.5 mg/dL).  Will continue to follow patient. CBC every 3 days per protocol.  Tawnya Crook, PharmD 02/09/2019,5:34 PM

## 2019-02-09 NOTE — Progress Notes (Signed)
Notified bedside nurse of need to draw lactic acid (repeat).  RN confirmed that lab was in with pt right now.

## 2019-02-09 NOTE — ED Notes (Signed)
Lab called to come stick patient. This RN able to obtain IV access, however unable to obtain labs.

## 2019-02-09 NOTE — ED Notes (Signed)
Lab in with pt 

## 2019-02-09 NOTE — Progress Notes (Signed)
PHARMACY -  BRIEF ANTIBIOTIC NOTE   Pharmacy has received consult(s) for vancomycin and cefepime from an ED provider.  The patient's profile has been reviewed for ht/wt/allergies/indication/available labs.    One time order(s) placed for vanc 1 g + cefepime 2 g  Further antibiotics/pharmacy consults should be ordered by admitting physician if indicated.                       Thank you,  Tawnya Crook, PharmD 02/09/2019  2:57 PM

## 2019-02-09 NOTE — ED Notes (Signed)
Pt visualized resting in recliner at this time. Pt awakens with mild stimuli. Pt denies any need at this time. Labs collected by lab technicians.

## 2019-02-09 NOTE — H&P (Signed)
History and Physical  Jamie Arias KZS:[id number] DOB: December 15, 1955 DOA: 02/09/2019  Referring physician: ED physician PCP: [name], MD  Outpatient Specialists:  1. Nephrologist  Chief Complaint: Altered level of consciousness  HPI: History limited as patient was unable to provide any history.  History obtained from ER charts staffs and patient's husband over the phone.  Jamie Arias is a 63 y.o. female with medical history significant of ESRD on HD MWF, anemia, chronic diastolic congestive heart failure, CAD status post PCI, type 2 diabetes, hypertension, hyperlipidemia, hypothyroidism, and conditions listed below presenting via EMS after she was found to be somewhat less responsive since last night and this morning.  According to husband patient was having stomach upset all day yesterday.  And patient has had excessive sweating overnight.  Patient was this morning noticed to be less responsive per her husband and therefore he called EMS to send her to the hospital.  And has been weak on lower extremity since her last discharge from in July from Covid pneumonia.  Has been patient did not have any shortness of breath, cough, seizure-like activity.Subjective fever as patient was sweating overnight.  EMS was called and who brought the patient's here.   Pt was admitted in in June for COVID-19 pneumonia and had a prolonged hospitalization before discharge in late July.  Patient was receiving home health care.  However as per record patient appears to have failed to follow-up with her PCP.   The ED patient denies any fevers, chills, chest pain, shortness of breath, cough, nausea, vomiting, abdominal pain, diarrhea, dysuria, or urinary frequency/urgency.  No additional history could be obtained from her. ED course: Patient was found to be hypotensive and somewhat tachycardic.  Patient was given fluid bolus and prophylactic antibiotic.  Appointment was found to be mildly elevated.  CT scan of  the head without contrast was found to be negative for any acute finding. Specialist service was called for further work-up and management.  Review of Systems: Very Limited as patient was not answering too much of the questions. Past Medical History:  Diagnosis Date  . Anemia   . Chronic diastolic CHF (congestive heart failure) ([place])    a. Due to ischemic cardiomyopathy. EF as low as 35%, improved to normal s/p CABG; b. echo 07/06/13: EF 55-60%, no RWMAs, mod TR, trivial pericardial effusion not c/w tamponade physiology;  c. 10/2015 Echo: EF 65%, Gr1 DD, triv AI, mild MR, mildly dil LA, mod TR, PASP 61mHg.  .Marland KitchenCoronary artery disease    a. NSTEMI 06/2013; b.cath: severe three-vessel CAD w/ EF 30% & mild-mod MR; c. s/p 3 vessel CABG 07/02/13 (LIMA-LAD, SVG-OM, and SVG-RPDA);  d. 10/2015 MV: no ischemia/infarct.  . Diabetes mellitus without complication ([place]   . Diabetic neuropathy ([place]   . Dialysis patient ([facility]    MWF  . ESRD ([place] stage renal disease) ([place]    a. 12/2015 initiated - mwf dialysis.  .Marland KitchenGERD (gastroesophageal reflux disease)   . Hyperlipidemia   . Hypertension   . Hypothyroidism   . Myocardial infarction ([place] 2015  . Neuropathy   . Pleural effusion 2015  . Pulmonary hypertension ([place]   . Wears dentures    full lower   Past Surgical History:  Procedure Laterality Date  . A/V FISTULAGRAM Right 12/17/2016   Procedure: A/V Fistulagram;  Surgeon: DAlgernon Huxley MD;  [facility];  Service: Cardiovascular;  Laterality: Right;  . A/V FISTULAGRAM Right 01/07/2017  Procedure: A/V Fistulagram;  Surgeon: Algernon Huxley, MD;  Location: [place] [facility];  Service: Cardiovascular;  Laterality: Right;  . A/V FISTULAGRAM Right 12/03/2017   Procedure: A/V FISTULAGRAM;  Surgeon: Katha Cabal, MD;  Location: [place] [facility];  Service: Cardiovascular;  Laterality: Right;  . A/V SHUNT INTERVENTION N/A 02/22/2017   Procedure: A/V SHUNT INTERVENTION;  Surgeon:  Algernon Huxley, MD;  Location: [place] [facility];  Service: Cardiovascular;  Laterality: N/A;  . AV FISTULA PLACEMENT Right 02/03/2016   Procedure: INSERTION OF ARTERIOVENOUS (AV) GORE-TEX GRAFT ARM ( BRACH / AXILLARY );  Surgeon: Katha Cabal, MD;  Location: [facility];  Service: Vascular;  Laterality: Right;  . CARDIAC CATHETERIZATION    . CATARACT EXTRACTION Bilateral   . CHOLECYSTECTOMY N/A 12/09/2014   Procedure: LAPAROSCOPIC CHOLECYSTECTOMY;  Surgeon: Marlyce [name], MD;  Location: [facility];  Service: General;  Laterality: N/A;  . CORONARY ARTERY BYPASS GRAFT N/A 07/02/2013   Procedure: CORONARY ARTERY BYPASS GRAFTING (CABG);  Surgeon: Ivin [name], MD;  Location: [place];  Service: Open Heart Surgery;  Laterality: N/A;  CABG x three, using left internal mammary artery and right leg greater saphenous vein harvested endoscopically  . ESOPHAGOGASTRODUODENOSCOPY (EGD) WITH PROPOFOL N/A 11/24/2015   Procedure: ESOPHAGOGASTRODUODENOSCOPY (EGD) WITH PROPOFOL;  Surgeon: [name], MD;  Location: [place];  Service: Endoscopy;  Laterality: N/A;  Diabetic - insulin  . EYE SURGERY Bilateral    Cataract Extraction with IOL  . INTRAOPERATIVE TRANSESOPHAGEAL ECHOCARDIOGRAM N/A 07/02/2013   Procedure: INTRAOPERATIVE TRANSESOPHAGEAL ECHOCARDIOGRAM;  Surgeon: Ivin [name], MD;  Location: [place];  Service: Open Heart Surgery;  Laterality: N/A;  . PERIPHERAL VASCULAR CATHETERIZATION Right 12/06/2015   Procedure: Dialysis/Perma Catheter Insertion;  Surgeon: Katha Cabal, MD;  Location: [place] [facility];  Service: Cardiovascular;  Laterality: Right;  . PERIPHERAL VASCULAR THROMBECTOMY Right 09/28/2016   Procedure: Peripheral Vascular Thrombectomy;  Surgeon: Katha Cabal, MD;  Location: [place] [facility];  Service: Cardiovascular;  Laterality: Right;  . PERIPHERAL VASCULAR THROMBECTOMY Right 05/20/2018   Procedure: PERIPHERAL VASCULAR THROMBECTOMY;  Surgeon: Katha Cabal, MD;  Location: [place] [facility];  Service: Cardiovascular;  Laterality: Right;  . PORTA CATH REMOVAL N/A 06/01/2016   Procedure: Glori [name] Cath Removal;  Surgeon: Katha Cabal, MD;  Location: [place] [facility];  Service: Cardiovascular;  Laterality: N/A;  . THORACENTESIS Left 2015   Social History:  reports that she has never smoked. She has never used smokeless tobacco. She reports that she does not drink alcohol or use drugs.   Allergies  Allergen Reactions  . Nsaids Other (See Comments)    Contraindicated due to kidney disease.  Marland Kitchen Doxycycline Other (See Comments)    tremor    Family History  Problem Relation Age of Onset  . COPD Mother   . Cancer Mother        Lung  . Pulmonary embolism Father   . Diabetes Father   . Diabetes Paternal Grandfather   . Heart disease Maternal Grandmother   . Colon cancer Neg Hx   . Colon polyps Neg Hx   . Esophageal cancer Neg Hx   . Pancreatic cancer Neg Hx   . Liver disease Neg Hx       Prior to Admission medications   Medication Sig Start Date End Date Taking? Authorizing Provider  ACCU-CHEK FASTCLIX LANCETS MISC Check blood glucose twice daily, diagnosis code E11.9 12/13/16   [name],  MD  acetaminophen (TYLENOL) 325 MG tablet Take 650 mg by mouth daily as needed for moderate pain or headache.     [provider]  Alirocumab (PRALUENT) 75 MG/ML SOAJ Inject 75 mg into the skin every 14 (fourteen) days. 04/04/18   Wellington Hampshire, MD  Amino Acids-Protein Hydrolys (FEEDING SUPPLEMENT, PRO-STAT SUGAR FREE 64,) LIQD Take 30 mLs by mouth 3 (three) times daily between meals. 09/09/18   Hosie Poisson, MD  amiodarone (PACERONE) 200 MG tablet Take 1 tablet (200 mg total) by mouth 2 (two) times daily for 4 days, THEN 1 tablet (200 mg total) daily. 09/15/18 12/18/18  Mercy Riding, MD  aspirin EC 81 MG tablet Take 81 mg by mouth daily.    [provider]  atorvastatin (LIPITOR) 80 MG tablet Take 1 tablet (80 mg  total) by mouth daily at 6 PM. 07/11/18   Wellington Hampshire, MD  blood glucose meter kit and supplies KIT Dispense based on patient and insurance preference. Use up to twice daily. (FOR ICD-10 E11.9) 07/01/15   Rubbie Battiest, RN  Blood Glucose Monitoring Suppl (ACCU-CHEK AVIVA PLUS) w/Device KIT Use as directed to test blood sugar up to three times daily 05/06/17   [name], MD  Calcium Acetate 668 (169 Ca) MG TABS Take 2,004 mg by mouth 3 (three) times daily with meals.  09/04/17   [provider]  ELIQUIS 5 MG TABS tablet  01/20/19   [provider]  ergocalciferol (VITAMIN D2) 50000 units capsule Take 50,000 Units by mouth once a week.     [provider]  ferrous sulfate 325 (65 FE) MG tablet Take 325 mg by mouth daily with breakfast.    [provider]  fluticasone (FLONASE) 50 MCG/ACT nasal spray Place 2 sprays into both nostrils daily. 09/17/17   [name], MD  furosemide (LASIX) 20 MG tablet Take 40 mg by mouth every other day. Non-dialysis days     [provider]  gabapentin (NEURONTIN) 300 MG capsule  01/20/19   [provider]  glucose blood (ACCU-CHEK AVIVA) test strip Use as instructed to test blood sugar up to 3 times daily 05/06/17   [name], MD  insulin aspart (NOVOLOG) 100 UNIT/ML injection Inject 0-9 Units into the skin 3 (three) times daily with meals. 09/11/18   Mercy Riding, MD  insulin aspart (NOVOLOG) 100 UNIT/ML injection Inject 0-5 Units into the skin at bedtime. 09/11/18   Mercy Riding, MD  insulin glargine (LANTUS) 100 UNIT/ML injection Inject 0.1 mLs (10 Units total) into the skin daily. If CBG greater than 150 consistently 09/11/18   Wendee Beavers T, MD  Lancets (ACCU-CHEK SOFT TOUCH) lancets Use as instructed 05/06/17   [name], MD  Lancets Misc. (ACCU-CHEK SOFTCLIX LANCET DEV) KIT Use as directed to test blood sugar up to three times daily 05/06/17   [name], MD  levothyroxine  (SYNTHROID) 50 MCG tablet Take 1 tablet (50 mcg total) by mouth daily at 6 (six) AM. 09/10/18   Hosie Poisson, MD  lidocaine-prilocaine (EMLA) cream Apply 1 application topically as needed (port access).    [provider]  midodrine (PROAMATINE) 10 MG tablet Take 1 tablet (10 mg total) by mouth 3 (three) times daily with meals. 09/09/18   Hosie Poisson, MD  mirtazapine (REMERON) 15 MG tablet Take 1 tablet (15 mg total) by mouth at bedtime. 09/11/18   Mercy Riding, MD  multivitamin (RENA-VIT) TABS  tablet Take 1 tablet by mouth at bedtime. 09/09/18   Hosie Poisson, MD  nitroGLYCERIN (NITROSTAT) 0.4 MG SL tablet DISSOLVE ONE TABLET UNDER THE TONGUE EVERY 5 MINUTES AS NEEDED FOR CHEST PAIN.  DO NOT EXCEED A TOTAL OF 3 DOSES IN 15 MINUTES Patient taking differently: Place 0.4 mg under the tongue every 5 (five) minutes as needed for chest pain. DISSOLVE ONE TABLET UNDER THE TONGUE EVERY 5 MINUTES AS NEEDED FOR CHEST PAIN.  DO NOT EXCEED A TOTAL OF 3 DOSES IN 15 MINUTES 08/18/18   Wellington Hampshire, MD  omeprazole (PRILOSEC) 20 MG capsule Take 20 mg by mouth 2 (two) times daily before a meal.    [provider]  ondansetron (ZOFRAN-ODT) 4 MG disintegrating tablet Take 1 tablet (4 mg total) by mouth every 8 (eight) hours as needed for nausea or vomiting. appt with PCP further refills Dr. Caryl Bis 01/28/19   McLean-Scocuzza, Nino Glow, MD  pantoprazole (PROTONIX) 40 MG tablet Take 40 mg by mouth daily. 01/20/19   [provider]  polyethylene glycol (MIRALAX / GLYCOLAX) 17 g packet Take 17 g by mouth daily. 09/10/18   Hosie Poisson, MD  venlafaxine (EFFEXOR) 75 MG tablet Take 1 tablet (75 mg total) by mouth 2 (two) times daily with a meal. 09/12/18   Mercy Riding, MD  vitamin C (VITAMIN C) 500 MG tablet Take 1 tablet (500 mg total) by mouth daily. 09/10/18   Hosie Poisson, MD  zinc sulfate 220 (50 Zn) MG capsule Take 1 capsule (220 mg total) by mouth daily. 09/10/18   Hosie Poisson, MD   Physical  Exam: Vitals:   02/09/19 1329 02/09/19 1630 02/09/19 1700 02/09/19 1730  BP: (!) 119/50     Pulse: 86 79 78 83  Resp: '16 16 14 13  '$ Temp: 98.3 F (36.8 C)     TempSrc: Oral     SpO2: 93% 98% 98% 97%  Weight:      Height:        Constitutional: She is oriented to person, place, and time. She appears well-developed and well-nourished. No distress.  HENT:  Head: Normocephalic.  Eyes: EOM are normal. Right eye exhibits no discharge. Left eye exhibits no discharge.  Neck: Neck supple.  Cardiovascular: Normal rate, regular rhythm and intact distal pulses.  Pulmonary/Chest: Effort normal and breath sounds normal. No respiratory distress. She has no wheezes. She has no rales.  Abdominal: Soft. Bowel sounds are normal. She exhibits no distension. There is no abdominal tenderness. There is no guarding.  Musculoskeletal:        General: No edema.  Neurological: She is alert and oriented to person, place, and time. No cranial nerve deficit.  Skin: Skin is warm and dry. She is not diaphoretic.    Labs on Admission:  Basic Metabolic Panel: Recent Labs  Lab 02/09/19 1143  NA 140  K 3.7  CL 99  CO2 23  GLUCOSE 101*  BUN 16  CREATININE 5.04*  CALCIUM 8.8*   Liver Function Tests: Recent Labs  Lab 02/09/19 1143  AST 92*  ALT 40  ALKPHOS 105  BILITOT 1.3*  PROT 6.1*  ALBUMIN 2.1*   Recent Labs  Lab 02/09/19 1315  LIPASE 16   No results for input(s): AMMONIA in the last 168 hours. CBC: Recent Labs  Lab 02/09/19 1143  WBC 15.8*  NEUTROABS 14.9*  HGB 10.7*  HCT 34.1*  MCV 85.0  PLT 215   Cardiac Enzymes: No results for input(s): CKTOTAL, CKMB,  CKMBINDEX, TROPONINI in the last 168 hours.  BNP (last 3 results) No results for input(s): PROBNP in the last 8760 hours. CBG: Recent Labs  Lab 02/09/19 1137  GLUCAP 89    Radiological Exams on Admission: Ct Head Wo Contrast  Result Date: 02/09/2019 CLINICAL DATA:  Altered level of consciousness, altered mental  status EXAM: CT HEAD WITHOUT CONTRAST TECHNIQUE: Contiguous axial images were obtained from the base of the skull through the vertex without intravenous contrast. COMPARISON:  CT head August 23, 2018, MRI October 10, 2017 FINDINGS: Brain: No evidence of acute infarction, hemorrhage, hydrocephalus, extra-axial collection or mass lesion/mass effect. Symmetric prominence of the ventricles, cisterns and sulci compatible with parenchymal volume loss. Patchy areas of white matter hypoattenuation are most compatible with chronic microvascular angiopathy. No Vascular: Atherosclerotic calcification of the carotid siphons and intradural vertebral arteries. No hyperdense vessel. Skull: No calvarial fracture or suspicious osseous lesion. No scalp swelling or hematoma. Sinuses/Orbits: Paranasal sinuses and mastoid air cells are predominantly clear. Orbital structures are unremarkable aside from prior lens extractions. Other: None IMPRESSION: 1. No acute intracranial abnormality. 2. Mild parenchymal atrophy and chronic microvascular angiopathy. Electronically Signed   By: Lovena Le M.D.   On: 02/09/2019 15:33   Dg Chest Port 1 View  Result Date: 02/09/2019 CLINICAL DATA:  Chest pain EXAM: PORTABLE CHEST 1 VIEW COMPARISON:  08/23/2018 FINDINGS: There is bilateral mild interstitial thickening. There is a trace left pleural effusion. There is no focal consolidation. There is no pneumothorax. There is stable cardiomegaly. There is evidence of prior CABG. There is no acute osseous abnormality. IMPRESSION: Cardiomegaly with mild pulmonary vascular congestion. Electronically Signed   By: Kathreen Devoid   On: 02/09/2019 14:26    EKG: Independently reviewed.  Normal sinus rhythm.  Assessment/Plan: 63 year old female found to be in altered level of consciousness with her significant other.  Patient is to be admitted inpatient for further work-up and management. Active Problems:   Altered level of consciousness   ALOC:  Multiple  etiology infection versus metabolic versus drug overdose/uremia -CT head negative -Blood culture, UA, ammonia level, urine drug screening, blood alcohol level -Status post prophylactic antibiotic in the ED; continue vancomycin and Zosyn -Monitor on telemetry -Obtain an EEG -May check orthostatic vitals when is stable  End-stage renal disease: missed dialysis today Does not appear significantly volume overloaded on exam.  Chest x-ray without evidence of pulmonary edema.  -Electrolytes, BUN and creatinine appear to me fairly acceptable range and better than recent past -Consult nephrology in a.m. for dialysis  Evaded troponin: High-sensitivity troponin found to be 60 - in the setting of end-stage renal disease Trend 2 more sets  - patient denies any chest pain and appears comfortable on exam -Need to monitor on telemetry  New A. YIR:SWNIOEVO diagnosed following Covid  - rhythm controlled with amiodarone; continue home dose. -Was a started on oral anticoagulant -Pharmacy consulted for oral anticoagulation  Diastolic CHF: Stable. -Fluid management with HD -Lasix on non-dialysis days as per home regimen -May obtain echocardiogram  Chronic anemia in the setting of end-stage renal disease -Hemoglobin 10.7.  No signs of active bleeding.  Continue to monitor.  Type 2 diabetes -Sliding scale insulin and CBG checks.  Hypertension -Normotensive -Continue home medication  For extremity weakness: Husband patient has been having it since her last admission from Covid pneumonia. -Patient has been requiring assistance and/or wheelchair for mobility -CT head did not show any acute findings that could explain -We will obtain PT OT  Depression/Acute Stress Disorder due to critical illness -Renew home Effexor -Continue home Remeron given poor appetite -Outpatient psych follow-up  Physical deconditioning -PT evaluation   DVT prophylaxis:  Home oral anticoagulant Code  Status:  Full code Family Communication: No family available. Disposition Plan: Anticipate discharge after clinical improvement. Consults called:  Nephrology  The medical decision making on this patient was of high complexity and the patient is at high risk for clinical deterioration, therefore this is a level 3 visit.   Time spent: 10 mins  Thornell Mule, MD, FACP, [facility]. [facility] Pager [phone]  If 7PM-7AM, please contact night-coverage [email or web address] Password [email or web address] 02/09/2019, 5:52 PM

## 2019-02-09 NOTE — ED Notes (Signed)
Labs collected by [facility], [place].

## 2019-02-10 ENCOUNTER — Inpatient Hospital Stay (HOSPITAL_COMMUNITY)
Admit: 2019-02-10 | Discharge: 2019-02-10 | Disposition: A | Discharge status: Home / Self Care | Payer: Medicare Other | Attending: Internal Medicine | Admitting: Internal Medicine

## 2019-02-10 ENCOUNTER — Encounter: Payer: Self-pay | Admitting: Internal Medicine

## 2019-02-10 ENCOUNTER — Other Ambulatory Visit: Payer: Self-pay

## 2019-02-10 DIAGNOSIS — Z79899 Other long term (current) drug therapy: Secondary | ICD-10-CM

## 2019-02-10 DIAGNOSIS — G9341 Metabolic encephalopathy: Secondary | ICD-10-CM

## 2019-02-10 DIAGNOSIS — I120 Hypertensive chronic kidney disease with stage 5 chronic kidney disease or end stage renal disease: Secondary | ICD-10-CM

## 2019-02-10 DIAGNOSIS — I361 Nonrheumatic tricuspid (valve) insufficiency: Secondary | ICD-10-CM

## 2019-02-10 DIAGNOSIS — B9562 Methicillin resistant Staphylococcus aureus infection as the cause of diseases classified elsewhere: Secondary | ICD-10-CM | POA: Diagnosis not present

## 2019-02-10 DIAGNOSIS — A4102 Sepsis due to Methicillin resistant Staphylococcus aureus: Secondary | ICD-10-CM | POA: Diagnosis not present

## 2019-02-10 DIAGNOSIS — R404 Transient alteration of awareness: Secondary | ICD-10-CM

## 2019-02-10 DIAGNOSIS — Z886 Allergy status to analgesic agent status: Secondary | ICD-10-CM

## 2019-02-10 DIAGNOSIS — I2510 Atherosclerotic heart disease of native coronary artery without angina pectoris: Secondary | ICD-10-CM

## 2019-02-10 DIAGNOSIS — Z992 Dependence on renal dialysis: Secondary | ICD-10-CM

## 2019-02-10 DIAGNOSIS — Z881 Allergy status to other antibiotic agents status: Secondary | ICD-10-CM

## 2019-02-10 DIAGNOSIS — Z7401 Bed confinement status: Secondary | ICD-10-CM

## 2019-02-10 DIAGNOSIS — R6520 Severe sepsis without septic shock: Secondary | ICD-10-CM

## 2019-02-10 DIAGNOSIS — I959 Hypotension, unspecified: Secondary | ICD-10-CM

## 2019-02-10 DIAGNOSIS — L89612 Pressure ulcer of right heel, stage 2: Secondary | ICD-10-CM

## 2019-02-10 DIAGNOSIS — Z8619 Personal history of other infectious and parasitic diseases: Secondary | ICD-10-CM

## 2019-02-10 DIAGNOSIS — N186 End stage renal disease: Secondary | ICD-10-CM

## 2019-02-10 DIAGNOSIS — R7881 Bacteremia: Secondary | ICD-10-CM | POA: Diagnosis present

## 2019-02-10 DIAGNOSIS — Z951 Presence of aortocoronary bypass graft: Secondary | ICD-10-CM

## 2019-02-10 DIAGNOSIS — I4891 Unspecified atrial fibrillation: Secondary | ICD-10-CM

## 2019-02-10 DIAGNOSIS — L8990 Pressure ulcer of unspecified site, unspecified stage: Secondary | ICD-10-CM

## 2019-02-10 DIAGNOSIS — E039 Hypothyroidism, unspecified: Secondary | ICD-10-CM

## 2019-02-10 LAB — COMPREHENSIVE METABOLIC PANEL
ALT: 35 U/L (ref 0–44)
AST: 70 U/L — ABNORMAL HIGH (ref 15–41)
Albumin: 1.7 g/dL — ABNORMAL LOW (ref 3.5–5.0)
Alkaline Phosphatase: 92 U/L (ref 38–126)
Anion gap: 14 (ref 5–15)
BUN: 17 mg/dL (ref 8–23)
CO2: 27 mmol/L (ref 22–32)
Calcium: 7.9 mg/dL — ABNORMAL LOW (ref 8.9–10.3)
Chloride: 99 mmol/L (ref 98–111)
Creatinine, Ser: 4.12 mg/dL — ABNORMAL HIGH (ref 0.44–1.00)
GFR calc Af Amer: 12 mL/min — ABNORMAL LOW (ref >60)
GFR calc non Af Amer: 11 mL/min — ABNORMAL LOW (ref >60)
Glucose, Bld: 88 mg/dL (ref 70–99)
Potassium: 3 mmol/L — ABNORMAL LOW (ref 3.5–5.1)
Sodium: 140 mmol/L (ref 135–145)
Total Bilirubin: 1.2 mg/dL (ref 0.3–1.2)
Total Protein: 5.2 g/dL — ABNORMAL LOW (ref 6.5–8.1)

## 2019-02-10 LAB — GLUCOSE, CAPILLARY
Glucose-Capillary: 75 mg/dL (ref 70–99)
Glucose-Capillary: 95 mg/dL (ref 70–99)
Glucose-Capillary: 153 mg/dL — ABNORMAL HIGH (ref 70–99)

## 2019-02-10 LAB — BLOOD CULTURE ID PANEL (REFLEXED)

## 2019-02-10 LAB — LACTIC ACID, PLASMA: Lactic Acid, Venous: 1 mmol/L (ref 0.5–1.9)

## 2019-02-10 LAB — CBC
HCT: 26.6 % — ABNORMAL LOW (ref 36.0–46.0)
Hemoglobin: 9 g/dL — ABNORMAL LOW (ref 12.0–15.0)
MCH: 27.6 pg (ref 26.0–34.0)
MCHC: 33.8 g/dL (ref 30.0–36.0)
MCV: 81.6 fL (ref 80.0–100.0)
Platelets: 172 10*3/uL (ref 150–400)
RBC: 3.26 MIL/uL — ABNORMAL LOW (ref 3.87–5.11)
RDW: 19.6 % — ABNORMAL HIGH (ref 11.5–15.5)
WBC: 6.7 10*3/uL (ref 4.0–10.5)
nRBC: 0 % (ref 0.0–0.2)

## 2019-02-10 LAB — BLOOD GAS, ARTERIAL
Acid-Base Excess: 8 mmol/L — ABNORMAL HIGH (ref 0.0–2.0)
Bicarbonate: 32 mmol/L — ABNORMAL HIGH (ref 20.0–28.0)
FIO2: 0.21
O2 Saturation: 91 %
Patient temperature: 37
pCO2 arterial: 41 mmHg (ref 32.0–48.0)
pH, Arterial: 7.5 — ABNORMAL HIGH (ref 7.350–7.450)
pO2, Arterial: 55 mmHg — ABNORMAL LOW (ref 83.0–108.0)

## 2019-02-10 LAB — ECHOCARDIOGRAM COMPLETE
Height: 64 in
Weight: 2754.87 oz

## 2019-02-10 LAB — MRSA PCR SCREENING: MRSA by PCR: POSITIVE — AB

## 2019-02-10 LAB — SARS CORONAVIRUS 2 (TAT 6-24 HRS): SARS Coronavirus 2: NEGATIVE

## 2019-02-10 LAB — PROTIME-INR
INR: 1.1 (ref 0.8–1.2)
Prothrombin Time: 13.6 seconds (ref 11.4–15.2)

## 2019-02-10 LAB — HEPATITIS B SURFACE ANTIGEN: Hepatitis B Surface Ag: NONREACTIVE

## 2019-02-10 MED ORDER — HEPARIN SODIUM (PORCINE) 1000 UNIT/ML DIALYSIS
1000.0000 [IU] | INTRAMUSCULAR | Status: DC | PRN
  Filled 2019-02-10: qty 1

## 2019-02-10 MED ORDER — VANCOMYCIN HCL 1.5 G IV SOLR
1500.0000 mg | Freq: Once | INTRAVENOUS | Status: AC
  Administered 2019-02-10: 1500 mg via INTRAVENOUS
  Filled 2019-02-10: qty 1500

## 2019-02-10 MED ORDER — SODIUM CHLORIDE 0.9 % IV BOLUS
1000.0000 mL | Freq: Once | INTRAVENOUS | Status: AC
  Administered 2019-02-10: 18:00:00 1000 mL via INTRAVENOUS

## 2019-02-10 MED ORDER — SODIUM CHLORIDE 0.9 % IV SOLN
INTRAVENOUS | Status: DC | PRN
  Administered 2019-02-10: 01:00:00 250 mL via INTRAVENOUS
  Administered 2019-02-18: 10 mL via INTRAVENOUS
  Administered 2019-02-18 – 2019-02-19 (×2): 500 mL via INTRAVENOUS
  Administered 2019-02-24 – 2019-02-26 (×2): 250 mL via INTRAVENOUS
  Administered 2019-02-27: 50 mL via INTRAVENOUS
  Administered 2019-02-28 – 2019-03-13 (×2): 250 mL via INTRAVENOUS
  Administered 2019-03-15: 11:00:00 15 mL via INTRAVENOUS
  Administered 2019-03-15: 18:00:00 10 mL via INTRAVENOUS
  Administered 2019-03-18: 17:00:00 250 mL via INTRAVENOUS
  Administered 2019-03-23 (×2): 20 mL via INTRAVENOUS

## 2019-02-10 MED ORDER — SODIUM CHLORIDE 0.9 % IV BOLUS
500.0000 mL | Freq: Once | INTRAVENOUS | Status: AC
  Administered 2019-02-10: 16:00:00 500 mL via INTRAVENOUS

## 2019-02-10 MED ORDER — VANCOMYCIN HCL 10 G IV SOLR
1500.0000 mg | Freq: Once | INTRAVENOUS | Status: DC
  Filled 2019-02-10: qty 1500

## 2019-02-10 MED ORDER — VANCOMYCIN HCL IN DEXTROSE 1-5 GM/200ML-% IV SOLN
1000.0000 mg | INTRAVENOUS | Status: DC
  Filled 2019-02-10 (×2): qty 200

## 2019-02-10 MED ORDER — RENA-VITE PO TABS
1.0000 | ORAL_TABLET | Freq: Every day | ORAL | Status: DC
  Filled 2019-02-10: qty 1

## 2019-02-10 MED ORDER — NOREPINEPHRINE 16 MG/250ML-% IV SOLN
0.0000 ug/min | INTRAVENOUS | Status: DC
  Administered 2019-02-10 – 2019-02-11 (×2): 5 ug/min via INTRAVENOUS
  Filled 2019-02-10 (×2): qty 250

## 2019-02-10 MED ORDER — VANCOMYCIN HCL IN DEXTROSE 1-5 GM/200ML-% IV SOLN
1000.0000 mg | Freq: Once | INTRAVENOUS | Status: DC
  Filled 2019-02-10: qty 200

## 2019-02-10 MED ORDER — CHLORHEXIDINE GLUCONATE CLOTH 2 % EX PADS
6.0000 | MEDICATED_PAD | Freq: Every day | CUTANEOUS | Status: DC
  Administered 2019-02-10 – 2019-04-20 (×61): 6 via TOPICAL

## 2019-02-10 NOTE — Progress Notes (Signed)
Hemodialysis-Patient bending access arm and pulling at leads. Very small infiltration noted to arterial needle, was able to be repositioned and treatment continues with reduced bfr for safety.

## 2019-02-10 NOTE — Consult Note (Signed)
Pharmacy Antibiotic Note  Jamie Arias is a 63 y.o. female admitted on 02/09/2019 with MRSA  bacteremia. Patient has history of ESRD on HD MWF. Missed dialysis yesterday. Blood cultures with 4/4 GPC and BCID detected MRSA. Source dialysis catheter? Pip/tazo discontinued. Pharmacy has been consulted for vancomycin dosing. ID consulted.   Patient received in-adequate vancomycin LD of 1 g yesterday. Weight on admission of 59 kg appears to be erroneous- updated weight today of 79.5 kg in chart appears more consistent with previous weights. Patient to receive HD today.   Plan: Will give Vancomycin 1.5 g x 1 today with dialysis. Maintenance dose adjusted up to 1 g after HD MWF.   Continue to monitor dialysis plan. Plan to get trough level after 3-4 maintenance doses.   Height: 5' (152.4 cm) Weight: 175 lb 4.3 oz (79.5 kg) IBW/kg (Calculated) : 45.5  Temp (24hrs), Avg:98.2 F (36.8 C), Min:98 F (36.7 C), Max:98.3 F (36.8 C)  Recent Labs  Lab 02/09/19 1143 02/09/19 1438 02/09/19 1836  WBC 15.8*  --   --   CREATININE 5.04*  --   --   LATICACIDVEN  --  2.6* 1.4    Estimated Creatinine Clearance: 10.4 mL/min (A) (by C-G formula based on SCr of 5.04 mg/dL (H)).    Allergies  Allergen Reactions  . Nsaids Other (See Comments)    Contraindicated due to kidney disease.  Marland Kitchen Doxycycline Other (See Comments)    tremor    Antimicrobials this admission: Cefepime and metronidazole 12/7 x1  Pip/tazo 12/8 x1 Vancomycin 12/7 >>   Dose adjustments this admission: 12/8: Increased maintenance dose from 500 mg to 1000 mg qHD based on up-dated weight in chart  Microbiology results: 12/7 BCx: 4/4 bottles GPC 12/7 BCID: MRSA 12/7 UCx: pending 12/7 SARS-CoV-2 NAAT: negative  Thank you for allowing pharmacy to be a part of this patient's care.  [place] Resident 02/10/2019 9:14 AM

## 2019-02-10 NOTE — Progress Notes (Addendum)
Hemodialysis completed. Patient tolerated fair. Received 500cc saline bolus per Dr. Candiss Norse due to hypotension with HD. Patient also received 1.5g Vancomycin per pharmacy with HD. Total uf net +1L with bolus and antibiotics. Reported off to primary RN. CBG 75. Patient remains confused. Follows with eyes but is unable to answer questions appropriately.   Note: labs drawn on patient. Tube station currently down. Lab to pick up from hd unit. Notified Dr. Candiss Norse.

## 2019-02-10 NOTE — Progress Notes (Signed)
[place], [place] 02/10/19  Subjective:   Hospital day # 1 Patient presented to the emergency room via EMS for abdominal pain and altered mental status.  Work-up so far shows negative ammonia, mildly elevated troponin, negative for alcohol.  Potassium at 3.7 Patient is not able to provide any meaningful information and continues to moan  Neuro: Came in with altered mental status and confusion.  Not able to answer questions cvs: Mouth appears dry.   Pulm: Requiring O2 by [place]. gi: No nausea or vomiting reported Renal: seen during dialysis   HEMODIALYSIS FLOWSHEET:  Blood Flow Rate (mL/min): 300 mL/min Arterial Pressure (mmHg): -190 mmHg Venous Pressure (mmHg): 90 mmHg Transmembrane Pressure (mmHg): 50 mmHg Ultrafiltration Rate (mL/min): 0 mL/min Dialysate Flow Rate (mL/min): 600 ml/min Conductivity: Machine : 14 Conductivity: Machine : 14 Dialysis Fluid Bolus: Normal Saline Bolus Amount (mL): 350 mL Dialysate Change: (3k 2.5ca)     Objective:  Vital signs in last 24 hours:  Temp:  [98 F (36.7 C)-98.3 F (36.8 C)] 98.3 F (36.8 C) (12/08 1251) Pulse Rate:  [64-90] 90 (12/08 1303) Resp:  [10-22] 18 (12/08 1303) BP: (73-163)/(34-131) 97/36 (12/08 1251) SpO2:  [95 %-100 %] 99 % (12/08 1303) Weight:  [79.5 kg-80.1 kg] 80.1 kg (12/08 1243)  Weight change:  Filed Weights   02/10/19 0532 02/10/19 0847 02/10/19 1243  Weight: 79.5 kg 79.5 kg 80.1 kg    Intake/Output:    Intake/Output Summary (Last 24 hours) at 02/10/2019 1409 Last data filed at 02/10/2019 1243 Gross per 24 hour  Intake 47.65 ml  Output -1117 ml  Net 1164.65 ml     Physical Exam: General:  Critically ill-appearing, lying in the bed  HEENT  dry oral mucous membranes  Pulm/lungs  normal breathing effort, [place] O2, clear  CVS/Heart  irregular rhythm  Abdomen:   Soft, nontender  Extremities:  Trace edema, left arm dependent edema  Neurologic:  Confused, moaning, not able  to answer questions  Skin:  Warm  Access:  AV fistula, right arm       Basic Metabolic Panel:  Recent Labs  Lab 02/09/19 1143  NA 140  K 3.7  CL 99  CO2 23  GLUCOSE 101*  BUN 16  CREATININE 5.04*  CALCIUM 8.8*     CBC: Recent Labs  Lab 02/09/19 1143  WBC 15.8*  NEUTROABS 14.9*  HGB 10.7*  HCT 34.1*  MCV 85.0  PLT 215     No results found for: HEPBSAG, HEPBSAB, HEPBIGM    Microbiology:  Recent Results (from the past 240 hour(s))  Culture, blood (routine x 2)     Status: None (Preliminary result)   Collection Time: 02/09/19  2:29 PM   Specimen: BLOOD LEFT ARM  Result Value Ref Range Status   Specimen Description BLOOD LEFT ARM  Final   Special Requests   Final    BOTTLES DRAWN AEROBIC AND ANAEROBIC Blood Culture adequate volume   Culture  Setup Time   Final    GRAM POSITIVE COCCI IN BOTH AEROBIC AND ANAEROBIC BOTTLES CRITICAL VALUE NOTED.  VALUE IS CONSISTENT WITH PREVIOUSLY REPORTED AND CALLED VALUE.    Culture   Final    NO GROWTH < 24 HOURS Performed at [facility], [address] [place], [place] [address]    Report Status PENDING  Incomplete  Culture, blood (routine x 2)     Status: None (Preliminary result)   Collection Time: 02/09/19  2:38 PM   Specimen: BLOOD  LEFT ARM  Result Value Ref Range Status   Specimen Description BLOOD LEFT ARM  Final   Special Requests   Final    BOTTLES DRAWN AEROBIC AND ANAEROBIC Blood Culture adequate volume   Culture  Setup Time   Final    GRAM POSITIVE COCCI IN BOTH AEROBIC AND ANAEROBIC BOTTLES Organism ID to follow CRITICAL RESULT CALLED TO, READ BACK BY AND VERIFIED WITH: Hart Robinsons PHARMD [id number] 02/10/2019 HNM    Culture   Final    NO GROWTH < 24 HOURS Performed at [facility], [address] [place], [place] [address]    Report Status PENDING  Incomplete  Blood Culture ID Panel (Reflexed)     Status: Abnormal   Collection Time: 02/09/19  2:38 PM  Result Value Ref Range Status    Enterococcus species NOT DETECTED NOT DETECTED Final   Listeria monocytogenes NOT DETECTED NOT DETECTED Final   Staphylococcus species DETECTED (A) NOT DETECTED Final    Comment: CRITICAL RESULT CALLED TO, READ BACK BY AND VERIFIED WITH: Hart Robinsons PHARMD [id number] 02/10/2019 HNM    Staphylococcus aureus (BCID) DETECTED (A) NOT DETECTED Final    Comment: Methicillin (oxacillin)-resistant Staphylococcus aureus (MRSA). MRSA is predictably resistant to beta-lactam antibiotics (except ceftaroline). Preferred therapy is vancomycin unless clinically contraindicated. Patient requires contact precautions if  hospitalized. CRITICAL RESULT CALLED TO, READ BACK BY AND VERIFIED WITH: Hart Robinsons PHARMD [id number] 02/10/2019 HNM    Methicillin resistance DETECTED (A) NOT DETECTED Final    Comment: CRITICAL RESULT CALLED TO, READ BACK BY AND VERIFIED WITH: Hart Robinsons PHARMD [id number] 02/10/2019 HNM    Streptococcus species NOT DETECTED NOT DETECTED Final   Streptococcus agalactiae NOT DETECTED NOT DETECTED Final   Streptococcus pneumoniae NOT DETECTED NOT DETECTED Final   Streptococcus pyogenes NOT DETECTED NOT DETECTED Final   Acinetobacter baumannii NOT DETECTED NOT DETECTED Final   Enterobacteriaceae species NOT DETECTED NOT DETECTED Final   Enterobacter cloacae complex NOT DETECTED NOT DETECTED Final   Escherichia coli NOT DETECTED NOT DETECTED Final   Klebsiella oxytoca NOT DETECTED NOT DETECTED Final   Klebsiella pneumoniae NOT DETECTED NOT DETECTED Final   Proteus species NOT DETECTED NOT DETECTED Final   Serratia marcescens NOT DETECTED NOT DETECTED Final   Haemophilus influenzae NOT DETECTED NOT DETECTED Final   Neisseria meningitidis NOT DETECTED NOT DETECTED Final   Pseudomonas aeruginosa NOT DETECTED NOT DETECTED Final   Candida albicans NOT DETECTED NOT DETECTED Final   Candida glabrata NOT DETECTED NOT DETECTED Final   Candida krusei NOT DETECTED NOT DETECTED Final   Candida parapsilosis NOT  DETECTED NOT DETECTED Final   Candida tropicalis NOT DETECTED NOT DETECTED Final    Comment: Performed at [facility], [place], [place] [address]  SARS CORONAVIRUS 2 (TAT 6-24 HRS) Nasopharyngeal Nasopharyngeal Swab     Status: None   Collection Time: 02/09/19  2:54 PM   Specimen: Nasopharyngeal Swab  Result Value Ref Range Status   SARS Coronavirus 2 NEGATIVE NEGATIVE Final    Comment: (NOTE) SARS-CoV-2 target nucleic acids are NOT DETECTED. The SARS-CoV-2 RNA is generally detectable in upper and lower respiratory specimens during the acute phase of infection. Negative results do not preclude SARS-CoV-2 infection, do not rule out co-infections with other pathogens, and should not be used as the sole basis for treatment or other patient management decisions. Negative results must be combined with clinical observations, patient history, and epidemiological information. The expected result is Negative. Fact Sheet for Patients:  SugarRoll.be Fact Sheet for Healthcare Providers: https://www.woods-mathews.com/ This test is not yet approved or cleared by the [place] FDA and  has been authorized for detection and/or diagnosis of SARS-CoV-2 by FDA under an Emergency Use Authorization (EUA). This EUA will remain  in effect (meaning this test can be used) for the duration of the COVID-19 declaration under Section 56 4(b)(1) of the Act, 21 U.S.C. section 360bbb-3(b)(1), unless the authorization is terminated or revoked sooner. Performed at [facility], [address] [place], [place] [address]     Coagulation Studies: Recent Labs    02/09/19 1143  LABPROT 13.8  INR 1.1    Urinalysis: No results for input(s): COLORURINE, LABSPEC, PHURINE, GLUCOSEU, HGBUR, BILIRUBINUR, KETONESUR, PROTEINUR, UROBILINOGEN, NITRITE, LEUKOCYTESUR in the last 72 hours.  Invalid input(s): APPERANCEUR    Imaging: Ct Head Wo  Contrast  Result Date: 02/09/2019 CLINICAL DATA:  Altered level of consciousness, altered mental status EXAM: CT HEAD WITHOUT CONTRAST TECHNIQUE: Contiguous axial images were obtained from the base of the skull through the vertex without intravenous contrast. COMPARISON:  CT head August 23, 2018, MRI October 10, 2017 FINDINGS: Brain: No evidence of acute infarction, hemorrhage, hydrocephalus, extra-axial collection or mass lesion/mass effect. Symmetric prominence of the ventricles, cisterns and sulci compatible with parenchymal volume loss. Patchy areas of white matter hypoattenuation are most compatible with chronic microvascular angiopathy. No Vascular: Atherosclerotic calcification of the carotid siphons and intradural vertebral arteries. No hyperdense vessel. Skull: No calvarial fracture or suspicious osseous lesion. No scalp swelling or hematoma. Sinuses/Orbits: Paranasal sinuses and mastoid air cells are predominantly clear. Orbital structures are unremarkable aside from prior lens extractions. Other: None IMPRESSION: 1. No acute intracranial abnormality. 2. Mild parenchymal atrophy and chronic microvascular angiopathy. Electronically Signed   By: Lovena Le M.D.   On: 02/09/2019 15:33   Dg Chest Port 1 View  Result Date: 02/09/2019 CLINICAL DATA:  Chest pain EXAM: PORTABLE CHEST 1 VIEW COMPARISON:  08/23/2018 FINDINGS: There is bilateral mild interstitial thickening. There is a trace left pleural effusion. There is no focal consolidation. There is no pneumothorax. There is stable cardiomegaly. There is evidence of prior CABG. There is no acute osseous abnormality. IMPRESSION: Cardiomegaly with mild pulmonary vascular congestion. Electronically Signed   By: Kathreen Devoid   On: 02/09/2019 14:26     Medications:   . sodium chloride 250 mL (02/10/19 0054)  . [START ON 02/11/2019] vancomycin     . Alirocumab  75 mg Subcutaneous Q14 Days  . amiodarone  200 mg Oral Daily  . apixaban  2.5 mg Oral BID  .  aspirin EC  81 mg Oral Daily  . atorvastatin  40 mg Oral q1800  . Chlorhexidine Gluconate Cloth  6 each Topical Q0600  . ferrous sulfate  325 mg Oral Q breakfast  . FLUoxetine  20 mg Oral Daily  . fluticasone  2 spray Each Nare Daily  . heparin  5,000 Units Subcutaneous Q8H  . insulin aspart  0-9 Units Subcutaneous TID WC  . levothyroxine  88 mcg Oral QAC breakfast  . midodrine  10 mg Oral TID WC  . pantoprazole  40 mg Oral Daily  . sodium chloride flush  3 mL Intravenous Once  . venlafaxine  75 mg Oral BID WC  . ascorbic acid  500 mg Oral Daily  . zinc sulfate  220 mg Oral Daily   sodium chloride, ondansetron (ZOFRAN) IV  Assessment/ Plan:  63 y.o. female with end-stage renal disease, atrial fibrillation,  chronic diastolic congestive heart failure, CAD status post PCI, CABG,  type 2 diabetes, hypertension, hyperlipidemia, hypothyroidism, Covid positive in July 2020, recent nursing home stay in [facility]  NEPHROLOGIST: Lavonia Dana MD  LOCATION: [place]: M-W-F 2nd Shift  EDW: 81.5 kg.  KIDNEY: [place] Optiflux 180NR  LITERS PROC: liters/treatment  HD TIME: 225  ACCESS: R Upper Arm AVF  NEEDLE SIZE:  ANTICOAG: Custom Heparin 2000  BATH: 2K/2.5Ca  QB: 400 ml/min  QD: 600 ml/min   1.  End-stage renal disease -  patient missed HD yesterday. Make up treatment today - We will continue dialysis MWF. Next treatment scheduled on wednesday - appears volume depleted. 500 cc bolus given during treatment - continue midodrine prior to HD  2.  Anemia of chronic kidney disease anemia of chronic kidney disease Lab Results  Component Value Date   HGB 10.7 (L) 02/09/2019  - will consider adding EPO with HD treatments  3. SHPTH - hold binders and non essential meds  4. Altered mental status - work up in progress  5. Sepsis - blood culture positive for MRSA from 02/09/2019  ? source       LOS: [place] 12/8/20202:09 PM  [place], [place]  Note: This note was prepared with Dragon dictation. Any transcription errors are unintentional

## 2019-02-10 NOTE — Procedures (Signed)
Arterial Line Placement: Indication: Frequent blood draws; Invasive BP monitoring.   Consent: Emergent.   Hand washing performed prior to starting the procedure.   Procedure: An active timeout was performed and correct patient, name, & ID confirmed. Physicial exam was performed to ensure adequate perfusion.  Using sterile technique, an aterial line was inserted into the RT Femoral artery.  Catheter threaded and the needle was removed with appropriate blood return.  Arterial waveform was noted.  After the procedure, the patient's extremities were observed to be pink and warm.   Estimated Blood Loss: None .   Number of Attempts: 1.   Complications: None .  Operator: Leeya Rusconi.   Ericha Whittingham David Jasmond River, M.D.  [facility]  Medical Director [facility] Medical Director [facility] Cardio-Pulmonary Department     

## 2019-02-10 NOTE — Plan of Care (Signed)
  Problem: Education: Goal: Knowledge of General Education information will improve Description Including pain rating scale, medication(s)/side effects and non-pharmacologic comfort measures Outcome: Progressing   

## 2019-02-10 NOTE — Progress Notes (Signed)
OT Cancellation Note  Patient Details Name: Jamie Arias MRN: [id number] DOB: 1956-01-02   Cancelled Treatment:    Reason Eval/Treat Not Completed: Patient at procedure or test/ unavailable OT order received and chart reviewed. Pt off floor at HD at this time and not available for evaluation. Will follow up as able for occupational therapy evaluation.   Gerrianne Scale, [name], OTR/L ascom [phone] 02/10/19, 9:25 AM

## 2019-02-10 NOTE — Progress Notes (Signed)
SLP Cancellation Note  Patient Details Name: Jamie Arias MRN: [id number] DOB: 26-May-1955   Cancelled treatment:       Reason Eval/Treat Not Completed: Patient at procedure or test/unavailable(chart reviewed; ST services will return)    Orinda Kenner, [place], CCC-SLP Watson,Katherine 02/10/2019, 8:48 AM

## 2019-02-10 NOTE — Procedures (Signed)
Central Venous Catheter Placement:TRIPLE LUMEN I WAS UNABLE TO PLACE RT IJ-UNABLE TO THREAD WIRE RT FEMORAL CVL PLACED  Indication: Patient receiving vesicant or irritant drug.; Patient receiving intravenous therapy for longer than 5 days.; Patient has limited or no vascular access.   Consent:emergent    Hand washing performed prior to starting the procedure.   Procedure:   An active timeout was performed and correct patient, name, & ID confirmed.   Patient was positioned correctly for central venous access.  Patient was prepped using strict sterile technique including chlorohexadine preps, sterile drape, sterile gown and sterile gloves.    The area was prepped, draped and anesthetized in the usual sterile manner. Patient comfort was obtained.    A triple lumen catheter was placed in RT FEMORAL  Vein There was good blood return, catheter caps were placed on lumens, catheter flushed easily, the line was secured and a sterile dressing and BIO-PATCH applied.   Ultrasound was used to visualize vasculature and guidance of needle.   Number of Attempts: 1 Complications:none Estimated Blood Loss: none Operator: Temari Schooler.   Corrin Parker, M.D.  [facility]  Medical Director [place] Director [facility] Cardio-Pulmonary Department

## 2019-02-10 NOTE — Consult Note (Signed)
NAME: Jamie Arias  DOB: 04-14-1955  MRN: [id number]  Date/Time: 02/10/2019 3:57 PM  REQUESTING PROVIDER: Mal Misty Subjective:  REASON FOR CONSULT: MRSA bacteremia ?No h/o from patient- she is obtunded. Husband at bed side- history from him and chart DEMII LEDER is a 63 y.o. female with a history of ESRD, CAD s/p 3v CABG in 2015, HTN, Hypothyroidism is admitted from home with altered mental status. As per husband who is at her bedside she was at her baseline on Saturday and Sunday she slept the whole day and did not eat or talk to him. She did not go for dilaysis yesterday and c/o abdominal pain and he called EMS and she was brought in Pt has been in and out of hospitals and NH since June when she was diagnosed with COVID 19. She received steroids but I dont see Remdesivir being given eventhopugh they were considering it.She was at [facility] 6/20-7/13/20 after being transferred from [facility] . She was then Dc to [facility].She was in [place] 09/18/18 for a few days for AMS and SOB . She was afterwards transferred to rehab on 09/26/18. She was sent to [facility] on 7/27 with malaise and fatigue and missing 2 dialysis appt. . She was also hypotensive and they checked serum cortisol which was normal. During that hospitalization she was found to have fistula arterial stenosis by PVL, and underwent IR   AV graft study and balloon venoplasty  GRAFT SHUNTOGRAM FINDINGS: The right upper arm AV graft showed multiple significant stenoses (70%) at the arterial anastomosis and 60% within a stented portion of the graft. Distal outflow and central veins were patent.   BALLOON VENOPLASTY: The stenoses in the graft lumen and arterial anastomosis were angioplastied using 8 mm x 4 cm ATB balloon. Post-angioplasty fistulogram demonstrated improved outflow venous lumen with rapid flow and residual stenosis of less than 10%.   Her presentation was thought to be due to deconditioning with metabolic encephalopathy  She also had  AFIB. She was  discharged on 8/7 to NH. As per husband she returned home mid [facility]- she has been bed bound since then. Sometimes can sit on the bed Husband helps her clean  In the ED vitals CT head no acute findings Blood culture sent and she was started on IV vanco and zosyn. MRSA in blood culture and I am seeing the patient for same  Past Medical History:  Diagnosis Date  . Anemia   . Chronic diastolic CHF (congestive heart failure) ([place])    a. Due to ischemic cardiomyopathy. EF as low as 35%, improved to normal s/p CABG; b. echo 07/06/13: EF 55-60%, no RWMAs, mod TR, trivial pericardial effusion not c/w tamponade physiology;  c. 10/2015 Echo: EF 65%, Gr1 DD, triv AI, mild MR, mildly dil LA, mod TR, PASP 69mmHg.  Marland Kitchen Coronary artery disease    a. NSTEMI 06/2013; b.cath: severe three-vessel CAD w/ EF 30% & mild-mod MR; c. s/p 3 vessel CABG 07/02/13 (LIMA-LAD, SVG-OM, and SVG-RPDA);  d. 10/2015 MV: no ischemia/infarct.  . Diabetes mellitus without complication ([place])   . Diabetic neuropathy ([place])   . Dialysis patient [facility])    MWF  . ESRD (end stage renal disease) ([place])    a. 12/2015 initiated - mwf dialysis.  Marland Kitchen GERD (gastroesophageal reflux disease)   . Hyperlipidemia   . Hypertension   . Hypothyroidism   . Myocardial infarction ([place]) 2015  . Neuropathy   . Pleural effusion 2015  . Pulmonary hypertension ([place])   .  Wears dentures    full lower    Past Surgical History:  Procedure Laterality Date  . A/V FISTULAGRAM Right 12/17/2016   Procedure: A/V Fistulagram;  Surgeon: Algernon Huxley, MD;  Location: [place] CV LAB;  Service: Cardiovascular;  Laterality: Right;  . A/V FISTULAGRAM Right 01/07/2017   Procedure: A/V Fistulagram;  Surgeon: Algernon Huxley, MD;  Location: [place] CV LAB;  Service: Cardiovascular;  Laterality: Right;  . A/V FISTULAGRAM Right 12/03/2017   Procedure: A/V FISTULAGRAM;  Surgeon: Katha Cabal, MD;  Location: [place] CV LAB;  Service: Cardiovascular;  Laterality:  Right;  . A/V SHUNT INTERVENTION N/A 02/22/2017   Procedure: A/V SHUNT INTERVENTION;  Surgeon: Algernon Huxley, MD;  Location: [place] CV LAB;  Service: Cardiovascular;  Laterality: N/A;  . AV FISTULA PLACEMENT Right 02/03/2016   Procedure: INSERTION OF ARTERIOVENOUS (AV) GORE-TEX GRAFT ARM ( BRACH / AXILLARY );  Surgeon: Katha Cabal, MD;  Location: [facility] ORS;  Service: Vascular;  Laterality: Right;  . CARDIAC CATHETERIZATION    . CATARACT EXTRACTION Bilateral   . CHOLECYSTECTOMY N/A 12/09/2014   Procedure: LAPAROSCOPIC CHOLECYSTECTOMY;  Surgeon: Marlyce [name], MD;  Location: [facility] ORS;  Service: General;  Laterality: N/A;  . CORONARY ARTERY BYPASS GRAFT N/A 07/02/2013   Procedure: CORONARY ARTERY BYPASS GRAFTING (CABG);  Surgeon: Ivin [name], MD;  Location: [place];  Service: Open Heart Surgery;  Laterality: N/A;  CABG x three, using left internal mammary artery and right leg greater saphenous vein harvested endoscopically  . ESOPHAGOGASTRODUODENOSCOPY (EGD) WITH PROPOFOL N/A 11/24/2015   Procedure: ESOPHAGOGASTRODUODENOSCOPY (EGD) WITH PROPOFOL;  Surgeon: [name], MD;  Location: [place];  Service: Endoscopy;  Laterality: N/A;  Diabetic - insulin  . EYE SURGERY Bilateral    Cataract Extraction with IOL  . INTRAOPERATIVE TRANSESOPHAGEAL ECHOCARDIOGRAM N/A 07/02/2013   Procedure: INTRAOPERATIVE TRANSESOPHAGEAL ECHOCARDIOGRAM;  Surgeon: Ivin [name], MD;  Location: [place];  Service: Open Heart Surgery;  Laterality: N/A;  . PERIPHERAL VASCULAR CATHETERIZATION Right 12/06/2015   Procedure: Dialysis/Perma Catheter Insertion;  Surgeon: Katha Cabal, MD;  Location: [place] CV LAB;  Service: Cardiovascular;  Laterality: Right;  . PERIPHERAL VASCULAR THROMBECTOMY Right 09/28/2016   Procedure: Peripheral Vascular Thrombectomy;  Surgeon: Katha Cabal, MD;  Location: [place] CV LAB;  Service: Cardiovascular;  Laterality: Right;  . PERIPHERAL VASCULAR  THROMBECTOMY Right 05/20/2018   Procedure: PERIPHERAL VASCULAR THROMBECTOMY;  Surgeon: Katha Cabal, MD;  Location: [place] CV LAB;  Service: Cardiovascular;  Laterality: Right;  . PORTA CATH REMOVAL N/A 06/01/2016   Procedure: Glori [name] Cath Removal;  Surgeon: Katha Cabal, MD;  Location: [place] CV LAB;  Service: Cardiovascular;  Laterality: N/A;  . THORACENTESIS Left 2015    Social History   Socioeconomic History  . Marital status: Married    Spouse name: Not on file  . Number of children: Not on file  . Years of education: Not on file  . Highest education level: Not on file  Occupational History  . Occupation: works for [employer]  . Financial resource strain: Not on file  . Food insecurity    Worry: Not on file    Inability: Not on file  . Transportation needs    Medical: Not on file    Non-medical: Not on file  Tobacco Use  . Smoking status: Never Smoker  . Smokeless tobacco: Never Used  Substance and Sexual Activity  . Alcohol use: No  Alcohol/week: 0.0 standard drinks  . Drug use: No  . Sexual activity: Not on file  Lifestyle  . Physical activity    Days per week: Not on file    Minutes per session: Not on file  . Stress: Not on file  Relationships  . Social Herbalist on phone: Not on file    Gets together: Not on file    Attends religious service: Not on file    Active member of club or organization: Not on file    Attends meetings of clubs or organizations: Not on file    Relationship status: Not on file  . Intimate partner violence    Fear of current or ex partner: Not on file    Emotionally abused: Not on file    Physically abused: Not on file    Forced sexual activity: Not on file  Other Topics Concern  . Not on file  Social History Narrative   Patient lives at home with her husband. Patient has 4 adult children.    Family History  Problem Relation Age of Onset  . COPD Mother   . Cancer Mother         Lung  . Pulmonary embolism Father   . Diabetes Father   . Diabetes Paternal Grandfather   . Heart disease Maternal Grandmother   . Colon cancer Neg Hx   . Colon polyps Neg Hx   . Esophageal cancer Neg Hx   . Pancreatic cancer Neg Hx   . Liver disease Neg Hx    Allergies  Allergen Reactions  . Nsaids Other (See Comments)    Contraindicated due to kidney disease.  Marland Kitchen Doxycycline Other (See Comments)    tremor    Current Facility-Administered Medications  Medication Dose Route Frequency Provider Last Rate Last Dose  . 0.9 %  sodium chloride infusion   Intravenous PRN Thornell Mule, MD 10 mL/hr at 02/10/19 0054 250 mL at 02/10/19 0054  . Alirocumab SOAJ 75 mg  75 mg Subcutaneous Q14 Days Thornell Mule, MD      . amiodarone (PACERONE) tablet 200 mg  200 mg Oral Daily Thornell Mule, MD   Stopped at 02/10/19 0915  . apixaban (ELIQUIS) tablet 2.5 mg  2.5 mg Oral BID Thornell Mule, MD      . aspirin EC tablet 81 mg  81 mg Oral Daily Thornell Mule, MD   Stopped at 02/10/19 [phone]  . atorvastatin (LIPITOR) tablet 40 mg  40 mg Oral q1800 Thornell Mule, MD      . Chlorhexidine Gluconate Cloth 2 % PADS 6 each  6 each Topical Q0600 Murlean Iba, MD   6 each at 02/10/19 0855  . ferrous sulfate tablet 325 mg  325 mg Oral Q breakfast Thornell Mule, MD      . FLUoxetine (PROZAC) capsule 20 mg  20 mg Oral Daily Thornell Mule, MD   Stopped at 02/10/19 [phone]  . fluticasone (FLONASE) 50 MCG/ACT nasal spray 2 spray  2 spray Each Nare Daily Thornell Mule, MD   Stopped at 02/10/19 0917  . heparin injection 5,000 Units  5,000 Units Subcutaneous Q8H Thornell Mule, MD   5,000 Units at 02/10/19 0132  . insulin aspart (novoLOG) injection 0-9 Units  0-9 Units Subcutaneous TID WC Thornell Mule, MD   1 Units at 02/09/19 1802  . levothyroxine (SYNTHROID) tablet 88 mcg  88 mcg Oral QAC breakfast Thornell Mule, MD      . midodrine (PROAMATINE) tablet 10 mg  10 mg Oral TID WC Thornell Mule, MD      .  ondansetron (ZOFRAN) injection 4 mg  4 mg Intravenous Q6H PRN Thornell Mule, MD      . pantoprazole (PROTONIX) EC tablet 40 mg  40 mg Oral Daily Thornell Mule, MD   Stopped at 02/10/19 [phone]  . sodium chloride flush (NS) 0.9 % injection 3 mL  3 mL Intravenous Once Blake Divine, MD      . Derrill Memo ON 02/11/2019] vancomycin (VANCOCIN) IVPB 1000 mg/200 mL premix  1,000 mg Intravenous Q M,W,F-HD Benita Gutter, RPH      . venlafaxine (EFFEXOR) tablet 75 mg  75 mg Oral BID WC Thornell Mule, MD      . vitamin C (ASCORBIC ACID) tablet 500 mg  500 mg Oral Daily Thornell Mule, MD   Stopped at 02/10/19 [phone]  . zinc sulfate capsule 220 mg  220 mg Oral Daily Thornell Mule, MD   Stopped at 02/10/19 [phone]     Abtx:  Anti-infectives (From admission, onward)   Start     Dose/Rate Route Frequency Ordered Stop   02/11/19 1200  vancomycin (VANCOCIN) 500 mg in sodium chloride 0.9 % 100 mL IVPB  Status:  Discontinued     500 mg 100 mL/hr over 60 Minutes Intravenous Every M-W-F (Hemodialysis) 02/09/19 1726 02/10/19 0906   02/11/19 1200  vancomycin (VANCOCIN) IVPB 1000 mg/200 mL premix     1,000 mg 200 mL/hr over 60 Minutes Intravenous Every M-W-F (Hemodialysis) 02/10/19 0906     02/10/19 1200  vancomycin (VANCOCIN) IVPB 1000 mg/200 mL premix  Status:  Discontinued     1,000 mg 200 mL/hr over 60 Minutes Intravenous  Once 02/10/19 0908 02/10/19 0922   02/10/19 1200  vancomycin (VANCOCIN) 1,500 mg in sodium chloride 0.9 % 500 mL IVPB  Status:  Discontinued     1,500 mg 250 mL/hr over 120 Minutes Intravenous  Once 02/10/19 0922 02/10/19 0933   02/10/19 1200  vancomycin (VANCOCIN) 1,500 mg in sodium chloride 0.9 % 500 mL IVPB     1,500 mg 250 mL/hr over 120 Minutes Intravenous  Once 02/10/19 0933 02/10/19 1232   02/09/19 2200  piperacillin-tazobactam (ZOSYN) IVPB 3.375 g  Status:  Discontinued     3.375 g 12.5 mL/hr over 240 Minutes Intravenous Every 12 hours 02/09/19 1719 02/10/19 0923   02/09/19 1718   vancomycin variable dose per unstable renal function (pharmacist dosing)  Status:  Discontinued      Does not apply See admin instructions 02/09/19 1719 02/09/19 1726   02/09/19 1500  ceFEPIme (MAXIPIME) 2 g in sodium chloride 0.9 % 100 mL IVPB     2 g 200 mL/hr over 30 Minutes Intravenous  Once 02/09/19 1453 02/09/19 1622   02/09/19 1500  metroNIDAZOLE (FLAGYL) IVPB 500 mg     500 mg 100 mL/hr over 60 Minutes Intravenous  Once 02/09/19 1453 02/09/19 1756   02/09/19 1500  vancomycin (VANCOCIN) IVPB 1000 mg/200 mL premix     1,000 mg 200 mL/hr over 60 Minutes Intravenous  Once 02/09/19 1453 02/09/19 1756      REVIEW OF SYSTEMS:  NA Objective:  VITALS:  BP (!) 76/41 (BP Location: Left Arm)   Pulse 65   Temp 98.3 F (36.8 C) (Axillary)   Resp 10   Ht 5' (1.524 m)   Wt 80.1 kg   SpO2 98%   BMI 34.49 kg/m  PHYSICAL EXAM:  General: lethargic to obtunded Involuntary tremors face and rt hand  No neck rigidity Opens her eyes spontaneously and tries to say something. Head: Normocephalic, without obvious abnormality, atraumatic. Eyes: Conjunctivae clear, anicteric sclerae. Pupils are equal ENTcannot examine Back: did not examine Lungs: b/l air entry Heart: s1s2 Abdomen: Soft, non-tender,not distended.  Extremities edema arms- left > rt, bruising left arm - swollen at the site of IV Skin: pressure scab over left heel Lymph: Cervical, supraclavicular normal. Neurologic: cannot examine because of her mental status- plantar equivocal Pertinent Labs Lab Results CBC    Component Value Date/Time   WBC 6.7 02/10/2019 0500   RBC 3.26 (L) 02/10/2019 0500   HGB 9.0 (L) 02/10/2019 0500   HGB 10.5 (L) 07/23/2013 0741   HCT 26.6 (L) 02/10/2019 0500   HCT 31.6 (L) 07/23/2013 0741   PLT 172 02/10/2019 0500   PLT 275 07/23/2013 0741   MCV 81.6 02/10/2019 0500   MCV 86 07/23/2013 0741   MCH 27.6 02/10/2019 0500   MCHC 33.8 02/10/2019 0500   RDW 19.6 (H) 02/10/2019 0500   RDW 15.0 (H)  07/23/2013 0741   LYMPHSABS 0.4 (L) 02/09/2019 1143   LYMPHSABS 1.1 07/19/2013 0436   MONOABS 0.4 02/09/2019 1143   MONOABS 0.5 07/19/2013 0436   EOSABS 0.0 02/09/2019 1143   EOSABS 0.0 07/19/2013 0436   BASOSABS 0.0 02/09/2019 1143   BASOSABS 0.1 07/19/2013 0436    CMP Latest Ref Rng & Units 02/10/2019 02/09/2019 09/15/2018  Glucose 70 - 99 mg/dL 88 101(H) 140(H)  BUN 8 - 23 mg/dL 17 16 55(H)  Creatinine 0.44 - 1.00 mg/dL 4.12(H) 5.04(H) 9.77(H)  Sodium 135 - 145 mmol/L 140 140 137  Potassium 3.5 - 5.1 mmol/L 3.0(L) 3.7 4.7  Chloride 98 - 111 mmol/L 99 99 97(L)  CO2 22 - 32 mmol/L 27 23 22   Calcium 8.9 - 10.3 mg/dL 7.9(L) 8.8(L) 9.0  Total Protein 6.5 - 8.1 g/dL 5.2(L) 6.1(L) -  Total Bilirubin 0.3 - 1.2 mg/dL 1.2 1.3(H) -  Alkaline Phos 38 - 126 U/L 92 105 -  AST 15 - 41 U/L 70(H) 92(H) -  ALT 0 - 44 U/L 35 40 -      Microbiology: Recent Results (from the past 240 hour(s))  Culture, blood (routine x 2)     Status: None (Preliminary result)   Collection Time: 02/09/19  2:29 PM   Specimen: BLOOD LEFT ARM  Result Value Ref Range Status   Specimen Description BLOOD LEFT ARM  Final   Special Requests   Final    BOTTLES DRAWN AEROBIC AND ANAEROBIC Blood Culture adequate volume   Culture  Setup Time   Final    GRAM POSITIVE COCCI IN BOTH AEROBIC AND ANAEROBIC BOTTLES CRITICAL VALUE NOTED.  VALUE IS CONSISTENT WITH PREVIOUSLY REPORTED AND CALLED VALUE.    Culture   Final    NO GROWTH < 24 HOURS Performed at [facility], [address] [place], [place] [address]    Report Status PENDING  Incomplete  Culture, blood (routine x 2)     Status: None (Preliminary result)   Collection Time: 02/09/19  2:38 PM   Specimen: BLOOD LEFT ARM  Result Value Ref Range Status   Specimen Description BLOOD LEFT ARM  Final   Special Requests   Final    BOTTLES DRAWN AEROBIC AND ANAEROBIC Blood Culture adequate volume   Culture  Setup Time   Final    GRAM POSITIVE COCCI IN BOTH  AEROBIC AND ANAEROBIC BOTTLES Organism ID to follow CRITICAL RESULT CALLED TO, READ  BACK BY AND VERIFIED WITH: Hart Robinsons [facility] [id number] 02/10/2019 HNM    Culture   Final    NO GROWTH < 24 HOURS Performed at [facility], [address] [place], [place] [address]    Report Status PENDING  Incomplete  Blood Culture ID Panel (Reflexed)     Status: Abnormal   Collection Time: 02/09/19  2:38 PM  Result Value Ref Range Status   Enterococcus species NOT DETECTED NOT DETECTED Final   Listeria monocytogenes NOT DETECTED NOT DETECTED Final   Staphylococcus species DETECTED (A) NOT DETECTED Final    Comment: CRITICAL RESULT CALLED TO, READ BACK BY AND VERIFIED WITH: Hart Robinsons PHARMD [id number] 02/10/2019 HNM    Staphylococcus aureus (BCID) DETECTED (A) NOT DETECTED Final    Comment: Methicillin (oxacillin)-resistant Staphylococcus aureus (MRSA). MRSA is predictably resistant to beta-lactam antibiotics (except ceftaroline). Preferred therapy is vancomycin unless clinically contraindicated. Patient requires contact precautions if  hospitalized. CRITICAL RESULT CALLED TO, READ BACK BY AND VERIFIED WITH: Hart Robinsons PHARMD [id number] 02/10/2019 HNM    Methicillin resistance DETECTED (A) NOT DETECTED Final    Comment: CRITICAL RESULT CALLED TO, READ BACK BY AND VERIFIED WITH: Hart Robinsons PHARMD [id number] 02/10/2019 HNM    Streptococcus species NOT DETECTED NOT DETECTED Final   Streptococcus agalactiae NOT DETECTED NOT DETECTED Final   Streptococcus pneumoniae NOT DETECTED NOT DETECTED Final   Streptococcus pyogenes NOT DETECTED NOT DETECTED Final   Acinetobacter baumannii NOT DETECTED NOT DETECTED Final   Enterobacteriaceae species NOT DETECTED NOT DETECTED Final   Enterobacter cloacae complex NOT DETECTED NOT DETECTED Final   Escherichia coli NOT DETECTED NOT DETECTED Final   Klebsiella oxytoca NOT DETECTED NOT DETECTED Final   Klebsiella pneumoniae NOT DETECTED NOT DETECTED Final   Proteus species NOT  DETECTED NOT DETECTED Final   Serratia marcescens NOT DETECTED NOT DETECTED Final   Haemophilus influenzae NOT DETECTED NOT DETECTED Final   Neisseria meningitidis NOT DETECTED NOT DETECTED Final   Pseudomonas aeruginosa NOT DETECTED NOT DETECTED Final   Candida albicans NOT DETECTED NOT DETECTED Final   Candida glabrata NOT DETECTED NOT DETECTED Final   Candida krusei NOT DETECTED NOT DETECTED Final   Candida parapsilosis NOT DETECTED NOT DETECTED Final   Candida tropicalis NOT DETECTED NOT DETECTED Final    Comment: Performed at [facility], [place], [place] [address]  SARS CORONAVIRUS 2 (TAT 6-24 HRS) Nasopharyngeal Nasopharyngeal Swab     Status: None   Collection Time: 02/09/19  2:54 PM   Specimen: Nasopharyngeal Swab  Result Value Ref Range Status   SARS Coronavirus 2 NEGATIVE NEGATIVE Final    Comment: (NOTE) SARS-CoV-2 target nucleic acids are NOT DETECTED. The SARS-CoV-2 RNA is generally detectable in upper and lower respiratory specimens during the acute phase of infection. Negative results do not preclude SARS-CoV-2 infection, do not rule out co-infections with other pathogens, and should not be used as the sole basis for treatment or other patient management decisions. Negative results must be combined with clinical observations, patient history, and epidemiological information. The expected result is Negative. Fact Sheet for Patients: SugarRoll.be Fact Sheet for Healthcare Providers: https://www.woods-mathews.com/ This test is not yet approved or cleared by the [place] FDA and  has been authorized for detection and/or diagnosis of SARS-CoV-2 by FDA under an Emergency Use Authorization (EUA). This EUA will remain  in effect (meaning this test can be used) for the duration of the COVID-19 declaration under Section 56 4(b)(1) of the Act, 21  U.S.C. section 360bbb-3(b)(1), unless the authorization is  terminated or revoked sooner. Performed at [facility], [place] [address] [place], [place] [address]     IMAGING RESULTS:  I have personally reviewed the films ? Impression/Recommendation ? ?MRSA bacteremia With AV fistula and ESRD and prolonged HCF stay she is at risk. Her AV fistula on the rt arm had balloon venoplasty in AUG at [facility] Currently on vanco  will  repeat blood culture, 2 d echo and TEE She does not have any cardiac device No Previous endocarditis NO prosthetic valves No IV drug use   Encephalopathy- recurrent since the past few months ? Uremic ? Due to infection H/o hypothyroid- TSH on the lower side No CVA as per CT May need MRI  COVID illness in July - did not receive any antivirals- was on steroids May check cortisol to make sure no adrenal insufficiency COVID illness in July- was on steroids  Deconditioning due to COVID- bed bound  Recently diagnosed Afib- on amiodarone ? Discussed the management with her husband and care team ___________________________________________________ Discussed with patient, requesting provider Note:  This document was prepared using Dragon voice recognition software and may include unintentional dictation errors.

## 2019-02-10 NOTE — Progress Notes (Signed)
Established hemodialysis patient known at [facility] MWF 11:00. Husband normally transports patient to treatments. Please contact me with any dialysis placement concerns.  Elvera Bicker Dialysis Coordinator [phone]

## 2019-02-10 NOTE — Progress Notes (Signed)
Initial Nutrition Assessment  DOCUMENTATION CODES:   Obesity unspecified  INTERVENTION:   RD will add Nepro supplements once diet advanced  Rena-vite daily   NUTRITION DIAGNOSIS:   Increased nutrient needs related to chronic illness(ESRD on HD) as evidenced by increased estimated needs.  GOAL:   Patient will meet greater than or equal to 90% of their needs  MONITOR:   PO intake, Supplement acceptance, Labs, Weight trends, Skin, I & O's  REASON FOR ASSESSMENT:   Malnutrition Screening Tool    ASSESSMENT:   63 y.o. female with end-stage renal disease on HD, atrial fibrillation, chronic diastolic congestive heart failure, CAD status post PCI, CABG,  type 2 diabetes, hypertension, hyperlipidemia, hypothyroidism, Covid positive in July 2020 admitted with AMS and found to have MRSA positive blood cultures   Pt in HD at time of RD visit today. Pt continues to have confusion. Pt NPO pending SLP evaluation. Per chart, pt appears to have lost 33lbs(16%) since having COVID 19 in July; this is significant weight loss. RD will add supplements once diet advanced. RD will obtain nutrition related history and exam at follow up.   Medications reviewed and include: aspirin, ferrous sulfate, heparin, insulin, synthroid, protonix, vitamin C, zinc, vancomycin   Labs reviewed: K 3.0(L), creat 4.12(H), AST 70(H) Hgb 9.0(L), Hct 26.6(L)  Unable to complete Nutrition-Focused physical exam at this time.   Diet Order:   Diet Order            Diet NPO time specified  Diet effective now             EDUCATION NEEDS:   Not appropriate for education at this time  Skin:  Skin Assessment: Reviewed RN Assessment(Stage II buttocks)  Last BM:  12/8- type 7  Height:   Ht Readings from Last 1 Encounters:  02/10/19 5' (1.524 m)    Weight:   Wt Readings from Last 1 Encounters:  02/10/19 80.1 kg    Ideal Body Weight:  45 kg  BMI:  Body mass index is 34.49 kg/m.  Estimated  Nutritional Needs:   Kcal:  1700-1900kcal/day  Protein:  85-95g/day  Fluid:  UOP +1L  Koleen Distance MS, RD, LDN Pager #- [phone] Office#- [phone] After Hours Pager: [phone]

## 2019-02-10 NOTE — Progress Notes (Signed)
Hemodialysis- Patient arrived to unit. Remains confused. All vitals currently stable. Note: has been npo so was not able to get midodrine prior to hd, md made aware. HD initiated via R AVG without issue using 15g needles x2. Continue to monitor.

## 2019-02-10 NOTE — Progress Notes (Signed)
SLP Cancellation Note  Patient Details Name: Jamie Arias MRN: [id number] DOB: 01-11-1956   Cancelled treatment:       Reason Eval/Treat Not Completed: Patient not medically ready;Fatigue/lethargy limiting ability to participate(chart reviewed; met w/ pt, husband in room). Pt has just returned from HD. She presents w/ decreased alertness and Confusion. She is not following instructions well, and often calling out.  Recommend holding on BSE and oral intake at this time. ST services will return to complete BSE in the AM. Recommend frequent oral care for hygiene and stimulation of swallowing; general aspiration precautions. NSG updated. Husband updated.      Orinda Kenner, MS, CCC-SLP Watson,Katherine 02/10/2019, 4:38 PM

## 2019-02-10 NOTE — Significant Event (Signed)
Rapid Response Event Note  Overview: Time Called: 1616 Arrival Time: 1618 Event Type: Cardiac  Initial Focused Assessment:  Patient presents with AMS, Hypotension S/P HD today. Bps on arrival are 60s/30s. Febrile. Decreased LOC.   Interventions:  ICU MD notified. Patient transferred to ICU room 16.   Plan of Care (if not transferred):  Transferred  Event Summary: Name of Physician Notified: Dr. Mortimer Fries at 1616    at    Outcome: Transferred (Comment)(Transferred to ICU)  Event End Time: [place]

## 2019-02-10 NOTE — Progress Notes (Signed)
PHARMACY - PHYSICIAN COMMUNICATION CRITICAL VALUE ALERT - BLOOD CULTURE IDENTIFICATION (BCID)  Jamie Arias is an 64 y.o. female who presented to [facility] on 02/09/2019 with a chief complaint of abdominal pain  Assessment:  Lab reports + blood cx, 4 of 4 bottles + for Staph, Mec A detected  Name of physician (or Provider) Contacted: Dr Sidney Ace  Current antibiotics: Vancomycin, Zosyn  Changes to prescribed antibiotics recommended:  Patient is on recommended antibiotics - No changes needed  Results for orders placed or performed during the hospital encounter of 02/09/19  Blood Culture ID Panel (Reflexed) (Collected: 02/09/2019  2:38 PM)  Result Value Ref Range   Enterococcus species NOT DETECTED NOT DETECTED   Listeria monocytogenes NOT DETECTED NOT DETECTED   Staphylococcus species DETECTED (A) NOT DETECTED   Staphylococcus aureus (BCID) DETECTED (A) NOT DETECTED   Methicillin resistance DETECTED (A) NOT DETECTED   Streptococcus species NOT DETECTED NOT DETECTED   Streptococcus agalactiae NOT DETECTED NOT DETECTED   Streptococcus pneumoniae NOT DETECTED NOT DETECTED   Streptococcus pyogenes NOT DETECTED NOT DETECTED   Acinetobacter baumannii NOT DETECTED NOT DETECTED   Enterobacteriaceae species NOT DETECTED NOT DETECTED   Enterobacter cloacae complex NOT DETECTED NOT DETECTED   Escherichia coli NOT DETECTED NOT DETECTED   Klebsiella oxytoca NOT DETECTED NOT DETECTED   Klebsiella pneumoniae NOT DETECTED NOT DETECTED   Proteus species NOT DETECTED NOT DETECTED   Serratia marcescens NOT DETECTED NOT DETECTED   Haemophilus influenzae NOT DETECTED NOT DETECTED   Neisseria meningitidis NOT DETECTED NOT DETECTED   Pseudomonas aeruginosa NOT DETECTED NOT DETECTED   Candida albicans NOT DETECTED NOT DETECTED   Candida glabrata NOT DETECTED NOT DETECTED   Candida krusei NOT DETECTED NOT DETECTED   Candida parapsilosis NOT DETECTED NOT DETECTED   Candida tropicalis NOT DETECTED NOT  DETECTED    Hart Robinsons A 02/10/2019  6:42 AM

## 2019-02-10 NOTE — Progress Notes (Signed)
PT Cancellation Note  Patient Details Name: Jamie Arias MRN: [id number] DOB: 04-17-1955   Cancelled Treatment:    Reason Eval/Treat Not Completed: Other (comment). Consult received and chart reviewed. Pt currently at HD at this time, not available for evaluation. Will continue to follow.   Chavela Justiniano 02/10/2019, 8:59 AM  Greggory Stallion, PT, DPT [phone]

## 2019-02-10 NOTE — Progress Notes (Addendum)
Progress Note    Jamie Arias  [id number] DOB: 06-30-1955  DOA: 02/09/2019 PCP: [name], MD      Assessment/Plan:   Principal Problem:   Sepsis due to methicillin resistant Staphylococcus aureus (MRSA) ([place]) Active Problems:   Hypotension   Altered level of consciousness   Pressure injury of skin   MRSA bacteremia   Body mass index is 34.49 kg/m.   MRSA sepsis and bacteremia with hypotension: Continue IV vancomycin.  2D echo is pending.  Consulted infectious disease specialist to assist with management.  Ordered IV normal saline 500 cc bolus  for hydration.  Consulted intensivist to assist with management and he has accepted the patient in transfer.  Toxic metabolic encephalopathy: Supportive care.  Keep n.p.o. for now until patient is alert enough to eat.  End-stage renal disease on hemodialysis: Patient had hemodialysis today.  Follow-up with nephrologist.  Hypokalemia: Management will be deferred to nephrologist since patient is on dialysis.  Monitor potassium closely.  Elevated troponin: This is likely due to demand ischemia in the setting of acute infection.  Atrial fibrillation: Continue amiodarone.  Hold Eliquis in anticipation of surgical procedures.  Chronic diastolic CHF: Fluid management with hemodialysis  Anemia of chronic disease: Monitor H&H.  Depression: Continue antidepressants as tolerated.  History of COVID-19 pneumonia in June 2020  Patient is critically ill and have discussed the case with Dr. Mortimer Fries, intensivist, and he has graciously accepted the patient in transfer to the ICU.  Patient will therefore be transferred to the ICU for further management.  Total critical care time spent was 40 minutes   Family Communication/Anticipated D/C date and plan/Code Status   DVT prophylaxis: Heparin Code Status: Full code Family Communication: Plan discussed with her husband at the bedside Disposition Plan: To be determined       Subjective:   Patient is confused and unable to provide any history.  Her husband was at the bedside.  Husband said she has been confused for the past 3 days.  Objective:    Vitals:   02/10/19 1303 02/10/19 1451 02/10/19 1525 02/10/19 1558  BP:  (!) 68/46 (!) 76/41 (!) 77/42  Pulse: 90 65  79  Resp: 18 10    Temp:      TempSrc:      SpO2: 99% 98%    Weight:      Height:        Intake/Output Summary (Last 24 hours) at 02/10/2019 1630 Last data filed at 02/10/2019 1243 Gross per 24 hour  Intake 47.65 ml  Output -1117 ml  Net 1164.65 ml   Filed Weights   02/10/19 0532 02/10/19 0847 02/10/19 1243  Weight: 79.5 kg 79.5 kg 80.1 kg    Exam:  GEN: NAD SKIN: Bruises on upper extremities and abdomen. Left heel decubitus ulcer with black eschar (unstageable), right heel decubitus ulcer stage 1.  Left buttock stage II decubitus ulcer EYES: EOMI ENT: MMM. Involuntary movement/tremors of mouth (husband said it's been like that for over 3 months CV: RRR PULM: CTA B ABD: soft, obese, NT, +BS CNS: Drowsy and confused.  Limited exam because she does not follow commands. EXT: Edema of b/l upper extremities(L>R), no tenderness   Data Reviewed:   I have personally reviewed following labs and imaging studies:  Labs: Labs show the following:   Basic Metabolic Panel: Recent Labs  Lab 02/09/19 1143 02/10/19 0500  NA 140 140  K 3.7 3.0*  CL 99 99  CO2  23 27  GLUCOSE 101* 88  BUN 16 17  CREATININE 5.04* 4.12*  CALCIUM 8.8* 7.9*   GFR Estimated Creatinine Clearance: 12.7 mL/min (A) (by C-G formula based on SCr of 4.12 mg/dL (H)). Liver Function Tests: Recent Labs  Lab 02/09/19 1143 02/10/19 0500  AST 92* 70*  ALT 40 35  ALKPHOS 105 92  BILITOT 1.3* 1.2  PROT 6.1* 5.2*  ALBUMIN 2.1* 1.7*   Recent Labs  Lab 02/09/19 1315  LIPASE 16   Recent Labs  Lab 02/09/19 1836  AMMONIA <9*   Coagulation profile Recent Labs  Lab 02/09/19 1143 02/10/19 0500  INR 1.1  1.1    CBC: Recent Labs  Lab 02/09/19 1143 02/10/19 0500  WBC 15.8* 6.7  NEUTROABS 14.9*  --   HGB 10.7* 9.0*  HCT 34.1* 26.6*  MCV 85.0 81.6  PLT 215 172   Cardiac Enzymes: No results for input(s): CKTOTAL, CKMB, CKMBINDEX, TROPONINI in the last 168 hours. BNP (last 3 results) No results for input(s): PROBNP in the last 8760 hours. CBG: Recent Labs  Lab 02/09/19 1137 02/09/19 1756 02/10/19 1216  GLUCAP 89 148* 75   D-Dimer: No results for input(s): DDIMER in the last 72 hours. Hgb A1c: Recent Labs    02/09/19 1836  HGBA1C 5.3   Lipid Profile: No results for input(s): CHOL, HDL, LDLCALC, TRIG, CHOLHDL, LDLDIRECT in the last 72 hours. Thyroid function studies: Recent Labs    02/09/19 1315  TSH 0.189*   Anemia work up: No results for input(s): VITAMINB12, FOLATE, FERRITIN, TIBC, IRON, RETICCTPCT in the last 72 hours. Sepsis Labs: Recent Labs  Lab 02/09/19 1143 02/09/19 1438 02/09/19 1836 02/10/19 0500  WBC 15.8*  --   --  6.7  LATICACIDVEN  --  2.6* 1.4  --     Microbiology Recent Results (from the past 240 hour(s))  Culture, blood (routine x 2)     Status: None (Preliminary result)   Collection Time: 02/09/19  2:29 PM   Specimen: BLOOD LEFT ARM  Result Value Ref Range Status   Specimen Description BLOOD LEFT ARM  Final   Special Requests   Final    BOTTLES DRAWN AEROBIC AND ANAEROBIC Blood Culture adequate volume   Culture  Setup Time   Final    GRAM POSITIVE COCCI IN BOTH AEROBIC AND ANAEROBIC BOTTLES CRITICAL VALUE NOTED.  VALUE IS CONSISTENT WITH PREVIOUSLY REPORTED AND CALLED VALUE.    Culture   Final    NO GROWTH < 24 HOURS Performed at [facility], [address] [place], [place] [address]    Report Status PENDING  Incomplete  Culture, blood (routine x 2)     Status: None (Preliminary result)   Collection Time: 02/09/19  2:38 PM   Specimen: BLOOD LEFT ARM  Result Value Ref Range Status   Specimen Description BLOOD LEFT  ARM  Final   Special Requests   Final    BOTTLES DRAWN AEROBIC AND ANAEROBIC Blood Culture adequate volume   Culture  Setup Time   Final    GRAM POSITIVE COCCI IN BOTH AEROBIC AND ANAEROBIC BOTTLES Organism ID to follow CRITICAL RESULT CALLED TO, READ BACK BY AND VERIFIED WITHHart Robinsons [facility] [id number] 02/10/2019 HNM    Culture   Final    NO GROWTH < 24 HOURS Performed at [facility], [address], [place], [place] [address]    Report Status PENDING  Incomplete  Blood Culture ID Panel (Reflexed)     Status: Abnormal  Collection Time: 02/09/19  2:38 PM  Result Value Ref Range Status   Enterococcus species NOT DETECTED NOT DETECTED Final   Listeria monocytogenes NOT DETECTED NOT DETECTED Final   Staphylococcus species DETECTED (A) NOT DETECTED Final    Comment: CRITICAL RESULT CALLED TO, READ BACK BY AND VERIFIED WITH: Hart Robinsons PHARMD [id number] 02/10/2019 HNM    Staphylococcus aureus (BCID) DETECTED (A) NOT DETECTED Final    Comment: Methicillin (oxacillin)-resistant Staphylococcus aureus (MRSA). MRSA is predictably resistant to beta-lactam antibiotics (except ceftaroline). Preferred therapy is vancomycin unless clinically contraindicated. Patient requires contact precautions if  hospitalized. CRITICAL RESULT CALLED TO, READ BACK BY AND VERIFIED WITH: Hart Robinsons PHARMD [id number] 02/10/2019 HNM    Methicillin resistance DETECTED (A) NOT DETECTED Final    Comment: CRITICAL RESULT CALLED TO, READ BACK BY AND VERIFIED WITH: Hart Robinsons PHARMD [id number] 02/10/2019 HNM    Streptococcus species NOT DETECTED NOT DETECTED Final   Streptococcus agalactiae NOT DETECTED NOT DETECTED Final   Streptococcus pneumoniae NOT DETECTED NOT DETECTED Final   Streptococcus pyogenes NOT DETECTED NOT DETECTED Final   Acinetobacter baumannii NOT DETECTED NOT DETECTED Final   Enterobacteriaceae species NOT DETECTED NOT DETECTED Final   Enterobacter cloacae complex NOT DETECTED NOT DETECTED Final   Escherichia  coli NOT DETECTED NOT DETECTED Final   Klebsiella oxytoca NOT DETECTED NOT DETECTED Final   Klebsiella pneumoniae NOT DETECTED NOT DETECTED Final   Proteus species NOT DETECTED NOT DETECTED Final   Serratia marcescens NOT DETECTED NOT DETECTED Final   Haemophilus influenzae NOT DETECTED NOT DETECTED Final   Neisseria meningitidis NOT DETECTED NOT DETECTED Final   Pseudomonas aeruginosa NOT DETECTED NOT DETECTED Final   Candida albicans NOT DETECTED NOT DETECTED Final   Candida glabrata NOT DETECTED NOT DETECTED Final   Candida krusei NOT DETECTED NOT DETECTED Final   Candida parapsilosis NOT DETECTED NOT DETECTED Final   Candida tropicalis NOT DETECTED NOT DETECTED Final    Comment: Performed at [facility], [place], [place] [address]  SARS CORONAVIRUS 2 (TAT 6-24 HRS) Nasopharyngeal Nasopharyngeal Swab     Status: None   Collection Time: 02/09/19  2:54 PM   Specimen: Nasopharyngeal Swab  Result Value Ref Range Status   SARS Coronavirus 2 NEGATIVE NEGATIVE Final    Comment: (NOTE) SARS-CoV-2 target nucleic acids are NOT DETECTED. The SARS-CoV-2 RNA is generally detectable in upper and lower respiratory specimens during the acute phase of infection. Negative results do not preclude SARS-CoV-2 infection, do not rule out co-infections with other pathogens, and should not be used as the sole basis for treatment or other patient management decisions. Negative results must be combined with clinical observations, patient history, and epidemiological information. The expected result is Negative. Fact Sheet for Patients: SugarRoll.be Fact Sheet for Healthcare Providers: https://www.woods-mathews.com/ This test is not yet approved or cleared by the [place] FDA and  has been authorized for detection and/or diagnosis of SARS-CoV-2 by FDA under an Emergency Use Authorization (EUA). This EUA will remain  in effect (meaning  this test can be used) for the duration of the COVID-19 declaration under Section 56 4(b)(1) of the Act, 21 U.S.C. section 360bbb-3(b)(1), unless the authorization is terminated or revoked sooner. Performed at [facility], [place] [address] [place], [place] [address]     Procedures and diagnostic studies:  Ct Head Wo Contrast  Result Date: 02/09/2019 CLINICAL DATA:  Altered level of consciousness, altered mental status EXAM: CT HEAD WITHOUT CONTRAST TECHNIQUE: Contiguous axial  images were obtained from the base of the skull through the vertex without intravenous contrast. COMPARISON:  CT head August 23, 2018, MRI October 10, 2017 FINDINGS: Brain: No evidence of acute infarction, hemorrhage, hydrocephalus, extra-axial collection or mass lesion/mass effect. Symmetric prominence of the ventricles, cisterns and sulci compatible with parenchymal volume loss. Patchy areas of white matter hypoattenuation are most compatible with chronic microvascular angiopathy. No Vascular: Atherosclerotic calcification of the carotid siphons and intradural vertebral arteries. No hyperdense vessel. Skull: No calvarial fracture or suspicious osseous lesion. No scalp swelling or hematoma. Sinuses/Orbits: Paranasal sinuses and mastoid air cells are predominantly clear. Orbital structures are unremarkable aside from prior lens extractions. Other: None IMPRESSION: 1. No acute intracranial abnormality. 2. Mild parenchymal atrophy and chronic microvascular angiopathy. Electronically Signed   By: Lovena Le M.D.   On: 02/09/2019 15:33   Dg Chest Port 1 View  Result Date: 02/09/2019 CLINICAL DATA:  Chest pain EXAM: PORTABLE CHEST 1 VIEW COMPARISON:  08/23/2018 FINDINGS: There is bilateral mild interstitial thickening. There is a trace left pleural effusion. There is no focal consolidation. There is no pneumothorax. There is stable cardiomegaly. There is evidence of prior CABG. There is no acute osseous abnormality. IMPRESSION:  Cardiomegaly with mild pulmonary vascular congestion. Electronically Signed   By: Kathreen Devoid   On: 02/09/2019 14:26    Medications:   . Alirocumab  75 mg Subcutaneous Q14 Days  . amiodarone  200 mg Oral Daily  . aspirin EC  81 mg Oral Daily  . atorvastatin  40 mg Oral q1800  . Chlorhexidine Gluconate Cloth  6 each Topical Q0600  . ferrous sulfate  325 mg Oral Q breakfast  . FLUoxetine  20 mg Oral Daily  . fluticasone  2 spray Each Nare Daily  . heparin  5,000 Units Subcutaneous Q8H  . insulin aspart  0-9 Units Subcutaneous TID WC  . levothyroxine  88 mcg Oral QAC breakfast  . midodrine  10 mg Oral TID WC  . multivitamin  1 tablet Oral QHS  . pantoprazole  40 mg Oral Daily  . sodium chloride flush  3 mL Intravenous Once  . venlafaxine  75 mg Oral BID WC  . ascorbic acid  500 mg Oral Daily  . zinc sulfate  220 mg Oral Daily   Continuous Infusions: . sodium chloride 250 mL (02/10/19 0054)  . [START ON 02/11/2019] vancomycin       LOS: 1 day   [place]  [facility]   *Please refer to [email or web address], password [email or web address] to get updated schedule on who will round on this patient, as hospitalists switch teams weekly. If 7PM-7AM, please contact night-coverage at [email or web address], password [email or web address] for any overnight needs.  02/10/2019, 4:30 PM

## 2019-02-10 NOTE — Progress Notes (Signed)
RR page received for this patient. Upon arrival, the medical team was assessing the patient's status and providing care. Chaplain maintained pastoral presence outside of the patient's room, offering silent prayer. Patient transferred to ICU 16.

## 2019-02-10 NOTE — Consult Note (Addendum)
Name: Jamie Arias MRN: [id number] DOB: 1955/05/04     Principal Problem:   Sepsis due to methicillin resistant Staphylococcus aureus (MRSA) ([place]) Active Problems:   Hypotension   Altered level of consciousness   Pressure injury of skin   MRSA bacteremia    CONSULTATION DATE: 02/09/2019  REFERRING MD : Renita Papa CHIEF COMPLAINT:  shock  HISTORY OF PRESENT ILLNESS:  Patient presented to the emergency room via EMS for abdominal pain and altered mental status.  Work-up so far shows negative ammonia, mildly elevated troponin, negative for alcohol. Potassium at 3.7 Patient is not able to provide any meaningful information and continues to moan She has ESRD on HD Her arms are swollen Transferred to [place] for severe shock and MRSA bacteremia   PAST MEDICAL HISTORY :   has a past medical history of Anemia, Chronic diastolic CHF (congestive heart failure) ([place]), Coronary artery disease, Diabetes mellitus without complication ([place]), Diabetic neuropathy ([place]), Dialysis patient ([employer]), ESRD (end stage renal disease) ([place]), GERD (gastroesophageal reflux disease), Hyperlipidemia, Hypertension, Hypothyroidism, Myocardial infarction ([place]) (2015), Neuropathy, Pleural effusion (2015), Pulmonary hypertension ([place]), and Wears dentures.  has a past surgical history that includes Cataract extraction (Bilateral); Coronary artery bypass graft (N/A, 07/02/2013); Intraoprative transesophageal echocardiogram (N/A, 07/02/2013); Thoracentesis (Left, 2015); Cholecystectomy (N/A, 12/09/2014); Esophagogastroduodenoscopy (egd) with propofol (N/A, 11/24/2015); Cardiac catheterization (Right, 12/06/2015); Eye surgery (Bilateral); Cardiac catheterization; AV fistula placement (Right, 02/03/2016); PORTA CATH REMOVAL (N/A, 06/01/2016); PERIPHERAL VASCULAR THROMBECTOMY (Right, 09/28/2016); A/V Fistulagram (Right, 12/17/2016); A/V Fistulagram (Right, 01/07/2017); A/V SHUNT INTERVENTION (N/A, 02/22/2017); A/V Fistulagram (Right, 12/03/2017);  and PERIPHERAL VASCULAR THROMBECTOMY (Right, 05/20/2018). Prior to Admission medications   Medication Sig Start Date End Date Taking? Authorizing Provider  ACCU-CHEK FASTCLIX LANCETS MISC Check blood glucose twice daily, diagnosis code E11.9 12/13/16  Yes [name], MD  acetaminophen (TYLENOL) 325 MG tablet Take 650 mg by mouth daily as needed for moderate pain or headache.    Yes [provider]  Alirocumab (PRALUENT) 75 MG/ML SOAJ Inject 75 mg into the skin every 14 (fourteen) days. 04/04/18  Yes Wellington Hampshire, MD  Amino Acids-Protein Hydrolys (FEEDING SUPPLEMENT, PRO-STAT SUGAR FREE 64,) LIQD Take 30 mLs by mouth 3 (three) times daily between meals. 09/09/18  Yes Hosie Poisson, MD  amiodarone (PACERONE) 200 MG tablet Take 200 mg by mouth daily.   Yes [provider]  aspirin EC 81 MG tablet Take 81 mg by mouth daily.   Yes [provider]  atorvastatin (LIPITOR) 40 MG tablet Take 40 mg by mouth daily.   Yes [provider]  Blood Glucose Monitoring Suppl (ACCU-CHEK AVIVA PLUS) w/Device KIT Use as directed to test blood sugar up to three times daily 05/06/17  Yes [name], MD  Calcium Acetate 667 MG TABS Take 2,001 mg by mouth 3 (three) times daily with meals.   Yes [provider]  ELIQUIS 5 MG TABS tablet Take 5 mg by mouth 2 (two) times daily.  01/20/19  Yes [provider]  ferrous sulfate 325 (65 FE) MG tablet Take 325 mg by mouth daily with breakfast.   Yes [provider]  FLUoxetine (PROZAC) 20 MG capsule Take 20 mg by mouth daily.   Yes [provider]  fluticasone (FLONASE) 50 MCG/ACT nasal spray Place 2 sprays into both nostrils daily. Patient taking differently: Place 2 sprays into both nostrils daily as needed for allergies or rhinitis.  09/17/17  Yes [name], MD  furosemide (LASIX) 20 MG tablet Take  40 mg by mouth every other day. Non-dialysis days    Yes [provider]   gabapentin (NEURONTIN) 300 MG capsule Take 300 mg by mouth 2 (two) times daily.  01/20/19  Yes [provider]  glucose blood (ACCU-CHEK AVIVA) test strip Use as instructed to test blood sugar up to 3 times daily 05/06/17  Yes [name], MD  Insulin Glargine (BASAGLAR KWIKPEN) 100 UNIT/ML SOPN Inject 18 Units into the skin daily.   Yes [provider]  Lancets (ACCU-CHEK SOFT TOUCH) lancets Use as instructed 05/06/17  Yes [name], MD  levothyroxine (SYNTHROID) 88 MCG tablet Take 88 mcg by mouth daily before breakfast.   Yes [provider]  lidocaine-prilocaine (EMLA) cream Apply 1 application topically as needed (port access).   Yes [provider]  midodrine (PROAMATINE) 10 MG tablet Take 1 tablet (10 mg total) by mouth 3 (three) times daily with meals. Patient taking differently: Take 10 mg by mouth 3 (three) times daily as needed (low BP).  09/09/18  Yes Hosie Poisson, MD  multivitamin (RENA-VIT) TABS tablet Take 1 tablet by mouth at bedtime. 09/09/18  Yes Hosie Poisson, MD  nitroGLYCERIN (NITROSTAT) 0.4 MG SL tablet DISSOLVE ONE TABLET UNDER THE TONGUE EVERY 5 MINUTES AS NEEDED FOR CHEST PAIN.  DO NOT EXCEED A TOTAL OF 3 DOSES IN 15 MINUTES Patient taking differently: Place 0.4 mg under the tongue every 5 (five) minutes as needed for chest pain. DISSOLVE ONE TABLET UNDER THE TONGUE EVERY 5 MINUTES AS NEEDED FOR CHEST PAIN.  DO NOT EXCEED A TOTAL OF 3 DOSES IN 15 MINUTES 08/18/18  Yes Wellington Hampshire, MD  ondansetron (ZOFRAN-ODT) 4 MG disintegrating tablet Take 1 tablet (4 mg total) by mouth every 8 (eight) hours as needed for nausea or vomiting. appt with PCP further refills Dr. Caryl Bis 01/28/19  Yes McLean-Scocuzza, Nino Glow, MD  pantoprazole (PROTONIX) 40 MG tablet Take 40 mg by mouth daily. 01/20/19  Yes [provider]  vitamin C (VITAMIN C) 500 MG tablet Take 1 tablet (500 mg total) by mouth daily. 09/10/18  Yes Hosie Poisson, MD   zinc sulfate 220 (50 Zn) MG capsule Take 1 capsule (220 mg total) by mouth daily. 09/10/18  Yes Hosie Poisson, MD  mirtazapine (REMERON) 15 MG tablet Take 1 tablet (15 mg total) by mouth at bedtime. Patient not taking: Reported on 02/09/2019 09/11/18   Mercy Riding, MD   Allergies  Allergen Reactions  . Nsaids Other (See Comments)    Contraindicated due to kidney disease.  Marland Kitchen Doxycycline Other (See Comments)    tremor    FAMILY HISTORY:  family history includes COPD in her mother; Cancer in her mother; Diabetes in her father and paternal grandfather; Heart disease in her maternal grandmother; Pulmonary embolism in her father. SOCIAL HISTORY:  reports that she has never smoked. She has never used smokeless tobacco. She reports that she does not drink alcohol or use drugs.  REVIEW OF SYSTEMS:   Unable to obtain due to critical illness     VITAL SIGNS: Temp:  [98 F (36.7 C)-98.3 F (36.8 C)] 98.3 F (36.8 C) (12/08 1251) Pulse Rate:  [64-90] 79 (12/08 1558) Resp:  [10-22] 10 (12/08 1451) BP: (68-163)/(34-131) 77/42 (12/08 1558) SpO2:  [95 %-100 %] 98 % (12/08 1451) Weight:  [79.5 kg-80.1 kg] 80.1 kg (12/08 1243)   I/O last 3 completed shifts: In: 47.7 [I.V.:0.4; IV Piggyback:47.3] Out: -  Total I/O In: -  Out: -  1117    SpO2: 98 % O2 Flow Rate (L/min): 2 L/min    MEDICATIONS: I have reviewed all medications and confirmed regimen as documented Physical Exam: General:  Critically ill-appearing, lying in the bed  HEENT  dry oral mucous membranes  Pulm/lungs  normal breathing effort, [place] O2, clear  CVS/Heart  irregular rhythm  Abdomen:   Soft, nontender  Extremities:  Trace edema, left arm dependent edema  Neurologic:  Confused, moaning, not able to answer questions  Skin:  Warm  Access:  AV fistula, right arm     CULTURE RESULTS   Recent Results (from the past 240 hour(s))  Culture, blood (routine x 2)     Status: None (Preliminary result)   Collection Time:  02/09/19  2:29 PM   Specimen: BLOOD LEFT ARM  Result Value Ref Range Status   Specimen Description BLOOD LEFT ARM  Final   Special Requests   Final    BOTTLES DRAWN AEROBIC AND ANAEROBIC Blood Culture adequate volume   Culture  Setup Time   Final    GRAM POSITIVE COCCI IN BOTH AEROBIC AND ANAEROBIC BOTTLES CRITICAL VALUE NOTED.  VALUE IS CONSISTENT WITH PREVIOUSLY REPORTED AND CALLED VALUE.    Culture   Final    NO GROWTH < 24 HOURS Performed at [facility], [address] [place], [place] [address]    Report Status PENDING  Incomplete  Culture, blood (routine x 2)     Status: None (Preliminary result)   Collection Time: 02/09/19  2:38 PM   Specimen: BLOOD LEFT ARM  Result Value Ref Range Status   Specimen Description BLOOD LEFT ARM  Final   Special Requests   Final    BOTTLES DRAWN AEROBIC AND ANAEROBIC Blood Culture adequate volume   Culture  Setup Time   Final    GRAM POSITIVE COCCI IN BOTH AEROBIC AND ANAEROBIC BOTTLES Organism ID to follow CRITICAL RESULT CALLED TO, READ BACK BY AND VERIFIED WITHHart Robinsons PHARMD [id number] 02/10/2019 HNM    Culture   Final    NO GROWTH < 24 HOURS Performed at [facility], [address] [place], [place] [address]    Report Status PENDING  Incomplete  Blood Culture ID Panel (Reflexed)     Status: Abnormal   Collection Time: 02/09/19  2:38 PM  Result Value Ref Range Status   Enterococcus species NOT DETECTED NOT DETECTED Final   Listeria monocytogenes NOT DETECTED NOT DETECTED Final   Staphylococcus species DETECTED (A) NOT DETECTED Final    Comment: CRITICAL RESULT CALLED TO, READ BACK BY AND VERIFIED WITH: Hart Robinsons PHARMD [id number] 02/10/2019 HNM    Staphylococcus aureus (BCID) DETECTED (A) NOT DETECTED Final    Comment: Methicillin (oxacillin)-resistant Staphylococcus aureus (MRSA). MRSA is predictably resistant to beta-lactam antibiotics (except ceftaroline). Preferred therapy is vancomycin unless clinically  contraindicated. Patient requires contact precautions if  hospitalized. CRITICAL RESULT CALLED TO, READ BACK BY AND VERIFIED WITH: Hart Robinsons PHARMD [id number] 02/10/2019 HNM    Methicillin resistance DETECTED (A) NOT DETECTED Final    Comment: CRITICAL RESULT CALLED TO, READ BACK BY AND VERIFIED WITH: Hart Robinsons PHARMD [id number] 02/10/2019 HNM    Streptococcus species NOT DETECTED NOT DETECTED Final   Streptococcus agalactiae NOT DETECTED NOT DETECTED Final   Streptococcus pneumoniae NOT DETECTED NOT DETECTED Final   Streptococcus pyogenes NOT DETECTED NOT DETECTED Final   Acinetobacter baumannii NOT DETECTED NOT DETECTED Final   Enterobacteriaceae species NOT DETECTED NOT DETECTED Final   Enterobacter  cloacae complex NOT DETECTED NOT DETECTED Final   Escherichia coli NOT DETECTED NOT DETECTED Final   Klebsiella oxytoca NOT DETECTED NOT DETECTED Final   Klebsiella pneumoniae NOT DETECTED NOT DETECTED Final   Proteus species NOT DETECTED NOT DETECTED Final   Serratia marcescens NOT DETECTED NOT DETECTED Final   Haemophilus influenzae NOT DETECTED NOT DETECTED Final   Neisseria meningitidis NOT DETECTED NOT DETECTED Final   Pseudomonas aeruginosa NOT DETECTED NOT DETECTED Final   Candida albicans NOT DETECTED NOT DETECTED Final   Candida glabrata NOT DETECTED NOT DETECTED Final   Candida krusei NOT DETECTED NOT DETECTED Final   Candida parapsilosis NOT DETECTED NOT DETECTED Final   Candida tropicalis NOT DETECTED NOT DETECTED Final    Comment: Performed at [facility], [place], [place] [address]  SARS CORONAVIRUS 2 (TAT 6-24 HRS) Nasopharyngeal Nasopharyngeal Swab     Status: None   Collection Time: 02/09/19  2:54 PM   Specimen: Nasopharyngeal Swab  Result Value Ref Range Status   SARS Coronavirus 2 NEGATIVE NEGATIVE Final    Comment: (NOTE) SARS-CoV-2 target nucleic acids are NOT DETECTED. The SARS-CoV-2 RNA is generally detectable in upper and lower respiratory  specimens during the acute phase of infection. Negative results do not preclude SARS-CoV-2 infection, do not rule out co-infections with other pathogens, and should not be used as the sole basis for treatment or other patient management decisions. Negative results must be combined with clinical observations, patient history, and epidemiological information. The expected result is Negative. Fact Sheet for Patients: SugarRoll.be Fact Sheet for Healthcare Providers: https://www.woods-mathews.com/ This test is not yet approved or cleared by the [place] FDA and  has been authorized for detection and/or diagnosis of SARS-CoV-2 by FDA under an Emergency Use Authorization (EUA). This EUA will remain  in effect (meaning this test can be used) for the duration of the COVID-19 declaration under Section 56 4(b)(1) of the Act, 21 U.S.C. section 360bbb-3(b)(1), unless the authorization is terminated or revoked sooner. Performed at [facility], [place] [address] [place], [place] [address]             ASSESSMENT AND PLAN SYNOPSIS  63 year old white female with end-stage renal disease with morbid obesity admitted to the stepdown unit ICU floor for severe septic shock secondary to MRSA bacteremia most likely source is skin infection complicated by metabolic encephalopathy  Severe metabolic encephalopathy most likely from severe acidosis and shock Patient is at high risk for cardiac arrest and intubation   End-stage renal Failure -follow chem 7 -follow UO -continue Foley Catheter-assess need -Avoid nephrotoxic agents Follow-up nephrology Recs  NEUROLOGY Severe encephalopathy due to septic shock May need to consider CT head to rule out stroke when stabilized  SHOCK-SEPSIS -use vasopressors to keep MAP>65 -follow ABG and LA -follow up cultures -emperic ABX -consider stress dose steroids -aggressive IV fluid resuscitation  CARDIAC  ICU monitoring  ID -continue IV abx as prescibed -follow up cultures  GI GI PROPHYLAXIS as indicated  NUTRITIONAL STATUS DIET--> n.p.o. Constipation protocol as indicated   ENDO - will use ICU hypoglycemic\Hyperglycemia protocol if needed    ELECTROLYTES -follow labs as needed -replace as needed -pharmacy consultation and following   DVT/GI PRX ordered TRANSFUSIONS AS NEEDED MONITOR FSBS ASSESS the need for LABS    Critical Care Time devoted to patient care services described in this note is 42 minutes.   Overall, patient is critically ill, prognosis is guarded.  Patient with Multiorgan failure and  at high risk for cardiac arrest and death.    Corrin Parker, M.D.  [facility]  Medical Director [place] Director [facility] Cardio-Pulmonary Department

## 2019-02-11 ENCOUNTER — Inpatient Hospital Stay: Payer: Medicare Other

## 2019-02-11 DIAGNOSIS — R531 Weakness: Secondary | ICD-10-CM

## 2019-02-11 DIAGNOSIS — R197 Diarrhea, unspecified: Secondary | ICD-10-CM

## 2019-02-11 DIAGNOSIS — M62838 Other muscle spasm: Secondary | ICD-10-CM

## 2019-02-11 DIAGNOSIS — J9601 Acute respiratory failure with hypoxia: Secondary | ICD-10-CM

## 2019-02-11 DIAGNOSIS — G9340 Encephalopathy, unspecified: Secondary | ICD-10-CM

## 2019-02-11 DIAGNOSIS — M62830 Muscle spasm of back: Secondary | ICD-10-CM

## 2019-02-11 LAB — BASIC METABOLIC PANEL
Anion gap: 13 (ref 5–15)
BUN: 12 mg/dL (ref 8–23)
CO2: 28 mmol/L (ref 22–32)
Calcium: 7.8 mg/dL — ABNORMAL LOW (ref 8.9–10.3)
Chloride: 101 mmol/L (ref 98–111)
Creatinine, Ser: 3.01 mg/dL — ABNORMAL HIGH (ref 0.44–1.00)
GFR calc Af Amer: 18 mL/min — ABNORMAL LOW (ref >60)
GFR calc non Af Amer: 16 mL/min — ABNORMAL LOW (ref >60)
Glucose, Bld: 173 mg/dL — ABNORMAL HIGH (ref 70–99)
Potassium: 2.9 mmol/L — ABNORMAL LOW (ref 3.5–5.1)
Sodium: 142 mmol/L (ref 135–145)

## 2019-02-11 LAB — CBC
HCT: 26.5 % — ABNORMAL LOW (ref 36.0–46.0)
Hemoglobin: 8.8 g/dL — ABNORMAL LOW (ref 12.0–15.0)
MCH: 27.1 pg (ref 26.0–34.0)
MCHC: 33.2 g/dL (ref 30.0–36.0)
MCV: 81.5 fL (ref 80.0–100.0)
Platelets: 140 10*3/uL — ABNORMAL LOW (ref 150–400)
RBC: 3.25 MIL/uL — ABNORMAL LOW (ref 3.87–5.11)
RDW: 20 % — ABNORMAL HIGH (ref 11.5–15.5)
WBC: 14.4 10*3/uL — ABNORMAL HIGH (ref 4.0–10.5)
nRBC: 0 % (ref 0.0–0.2)

## 2019-02-11 LAB — GLUCOSE, CAPILLARY
Glucose-Capillary: 160 mg/dL — ABNORMAL HIGH (ref 70–99)
Glucose-Capillary: 137 mg/dL — ABNORMAL HIGH (ref 70–99)
Glucose-Capillary: 125 mg/dL — ABNORMAL HIGH (ref 70–99)
Glucose-Capillary: 112 mg/dL — ABNORMAL HIGH (ref 70–99)

## 2019-02-11 LAB — MAGNESIUM: Magnesium: 1.7 mg/dL (ref 1.7–2.4)

## 2019-02-11 MED ORDER — VANCOMYCIN HCL IN DEXTROSE 1-5 GM/200ML-% IV SOLN
1000.0000 mg | Freq: Once | INTRAVENOUS | Status: AC
  Administered 2019-02-12: 13:00:00 1000 mg via INTRAVENOUS
  Filled 2019-02-11: qty 200

## 2019-02-11 MED ORDER — ALBUMIN HUMAN 25 % IV SOLN
25.0000 g | Freq: Every day | INTRAVENOUS | Status: AC
  Administered 2019-02-11 – 2019-02-14 (×4): 25 g via INTRAVENOUS
  Filled 2019-02-11 (×2): qty 50
  Filled 2019-02-11: qty 100
  Filled 2019-02-11 (×4): qty 50

## 2019-02-11 MED ORDER — PANTOPRAZOLE SODIUM 40 MG IV SOLR
40.0000 mg | INTRAVENOUS | Status: DC
  Administered 2019-02-11 – 2019-02-16 (×6): 40 mg via INTRAVENOUS
  Filled 2019-02-11 (×6): qty 40

## 2019-02-11 MED ORDER — POTASSIUM CHLORIDE 10 MEQ/100ML IV SOLN
10.0000 meq | INTRAVENOUS | Status: AC
  Administered 2019-02-11 (×2): 10 meq via INTRAVENOUS
  Filled 2019-02-11 (×2): qty 100

## 2019-02-11 MED ORDER — CHLORHEXIDINE GLUCONATE 0.12 % MT SOLN
15.0000 mL | Freq: Two times a day (BID) | OROMUCOSAL | Status: DC
  Administered 2019-02-11 – 2019-03-03 (×26): 15 mL via OROMUCOSAL
  Filled 2019-02-11 (×24): qty 15

## 2019-02-11 MED ORDER — STERILE WATER FOR INJECTION IJ SOLN
INTRAMUSCULAR | Status: AC
  Administered 2019-02-11: 14:00:00 10 mL
  Filled 2019-02-11: qty 10

## 2019-02-11 MED ORDER — ORAL CARE MOUTH RINSE
15.0000 mL | Freq: Two times a day (BID) | OROMUCOSAL | Status: DC
  Administered 2019-02-11 – 2019-03-05 (×27): 15 mL via OROMUCOSAL

## 2019-02-11 MED ORDER — FENTANYL CITRATE (PF) 100 MCG/2ML IJ SOLN
25.0000 ug | INTRAMUSCULAR | Status: DC | PRN
  Administered 2019-02-12 – 2019-02-13 (×3): 25 ug via INTRAVENOUS
  Filled 2019-02-11 (×3): qty 2

## 2019-02-11 MED ORDER — FENTANYL CITRATE (PF) 100 MCG/2ML IJ SOLN
INTRAMUSCULAR | Status: AC
  Administered 2019-02-11: 17:00:00 25 ug via INTRAVENOUS
  Filled 2019-02-11: qty 2

## 2019-02-11 NOTE — Progress Notes (Addendum)
Follow up - Critical Care Medicine Note  Patient Details:    Jamie Arias is an 63 y.o. female with end-stage renal disease with morbid obesity admitted to the stepdown unit ICU floor for severe septic shock secondary to MRSA bacteremia most likely source is skin infection complicated by metabolic encephalopathy  Lines, Airways, Drains: CVC Triple Lumen 02/10/19 Right Femoral (Active)  Indication for Insertion or Continuance of Line Vasoactive infusions 02/11/19 0821  Site Assessment Clean;Dry;Intact 02/11/19 0821  Proximal Lumen Status Saline locked;Blood return noted 02/11/19 0821  Medial Lumen Status Infusing;Blood return noted 02/11/19 [id number]  Distal Lumen Status Saline locked;Flushed;Blood return noted 02/11/19 0821  Dressing Type Transparent 02/11/19 0821  Dressing Status Clean;Dry;Intact;Antimicrobial disc in place 02/11/19 Oden checked and tightened 02/11/19 [id number]  Dressing Intervention Dressing changed;Antimicrobial disc changed 02/10/19 2000  Dressing Change Due 02/17/19 02/11/19 [id number]     Arterial Line 02/10/19 Right Femoral (Active)  Site Assessment Clean;Dry;Intact 02/11/19 0821  Line Status Pulsatile blood flow 02/11/19 0821  Art Line Waveform Appropriate 02/11/19 0821  Art Line Interventions Connections checked and tightened;Zeroed and calibrated 02/11/19 0821  Color/Movement/Sensation Capillary refill less than 3 sec 02/11/19 0000  Dressing Type Transparent 02/11/19 0821  Dressing Status Clean;Dry;Intact 02/11/19 0821  Interventions Dressing changed;Antimicrobial disc changed 02/10/19 2000  Dressing Change Due 02/17/19 02/11/19 [id number]    Anti-infectives:  Anti-infectives (From admission, onward)   Start     Dose/Rate Route Frequency Ordered Stop   02/12/19 1400  vancomycin (VANCOCIN) IVPB 1000 mg/200 mL premix     1,000 mg 200 mL/hr over 60 Minutes Intravenous  Once 02/11/19 1204     02/11/19 1200  vancomycin (VANCOCIN) 500 mg in sodium chloride  0.9 % 100 mL IVPB  Status:  Discontinued     500 mg 100 mL/hr over 60 Minutes Intravenous Every M-W-F (Hemodialysis) 02/09/19 1726 02/10/19 0906   02/11/19 1200  vancomycin (VANCOCIN) IVPB 1000 mg/200 mL premix  Status:  Discontinued     1,000 mg 200 mL/hr over 60 Minutes Intravenous Every M-W-F (Hemodialysis) 02/10/19 0906 02/11/19 1204   02/10/19 1200  vancomycin (VANCOCIN) IVPB 1000 mg/200 mL premix  Status:  Discontinued     1,000 mg 200 mL/hr over 60 Minutes Intravenous  Once 02/10/19 0908 02/10/19 0922   02/10/19 1200  vancomycin (VANCOCIN) 1,500 mg in sodium chloride 0.9 % 500 mL IVPB  Status:  Discontinued     1,500 mg 250 mL/hr over 120 Minutes Intravenous  Once 02/10/19 0922 02/10/19 0933   02/10/19 1200  vancomycin (VANCOCIN) 1,500 mg in sodium chloride 0.9 % 500 mL IVPB     1,500 mg 250 mL/hr over 120 Minutes Intravenous  Once 02/10/19 0933 02/10/19 1232   02/09/19 2200  piperacillin-tazobactam (ZOSYN) IVPB 3.375 g  Status:  Discontinued     3.375 g 12.5 mL/hr over 240 Minutes Intravenous Every 12 hours 02/09/19 1719 02/10/19 0923   02/09/19 1718  vancomycin variable dose per unstable renal function (pharmacist dosing)  Status:  Discontinued      Does not apply See admin instructions 02/09/19 1719 02/09/19 1726   02/09/19 1500  ceFEPIme (MAXIPIME) 2 g in sodium chloride 0.9 % 100 mL IVPB     2 g 200 mL/hr over 30 Minutes Intravenous  Once 02/09/19 1453 02/09/19 1622   02/09/19 1500  metroNIDAZOLE (FLAGYL) IVPB 500 mg     500 mg 100 mL/hr over 60 Minutes Intravenous  Once 02/09/19 1453 02/09/19 1756   02/09/19 1500  vancomycin (VANCOCIN) IVPB 1000 mg/200 mL premix     1,000 mg 200 mL/hr over 60 Minutes Intravenous  Once 02/09/19 1453 02/09/19 1756      Microbiology: Results for orders placed or performed during the hospital encounter of 02/09/19  Culture, blood (routine x 2)     Status: Abnormal (Preliminary result)   Collection Time: 02/09/19  2:29 PM   Specimen: BLOOD  LEFT ARM  Result Value Ref Range Status   Specimen Description   Final    BLOOD LEFT ARM Performed at [facility], [address] [place], [place] [address]    Special Requests   Final    BOTTLES DRAWN AEROBIC AND ANAEROBIC Blood Culture adequate volume Performed at [facility], [address] [place], [place] [address]    Culture  Setup Time   Final    GRAM POSITIVE COCCI IN BOTH AEROBIC AND ANAEROBIC BOTTLES CRITICAL VALUE NOTED.  VALUE IS CONSISTENT WITH PREVIOUSLY REPORTED AND CALLED VALUE. Performed at [facility], [address] [place], [place] [address]    Culture (A)  Final    STAPHYLOCOCCUS AUREUS SUSCEPTIBILITIES TO FOLLOW Performed at [facility], [place] [address] [place], [place] [address]    Report Status PENDING  Incomplete  Culture, blood (routine x 2)     Status: Abnormal (Preliminary result)   Collection Time: 02/09/19  2:38 PM   Specimen: BLOOD LEFT ARM  Result Value Ref Range Status   Specimen Description   Final    BLOOD LEFT ARM Performed at [facility], [address], [place], [place] [address]    Special Requests   Final    BOTTLES DRAWN AEROBIC AND ANAEROBIC Blood Culture adequate volume Performed at [facility], [address], [place], [place] [address]    Culture  Setup Time   Final    GRAM POSITIVE COCCI IN BOTH AEROBIC AND ANAEROBIC BOTTLES CRITICAL RESULT CALLED TO, READ BACK BY AND VERIFIED WITH: Hart Robinsons PHARMD [id number] 02/10/2019 HNM    Culture (A)  Final    STAPHYLOCOCCUS AUREUS SUSCEPTIBILITIES TO FOLLOW Performed at [facility], [place] [address] [place], [place] [address]    Report Status PENDING  Incomplete  Blood Culture ID Panel (Reflexed)     Status: Abnormal   Collection Time: 02/09/19  2:38 PM  Result Value Ref Range Status   Enterococcus species NOT DETECTED NOT DETECTED Final   Listeria monocytogenes NOT DETECTED NOT DETECTED Final   Staphylococcus species  DETECTED (A) NOT DETECTED Final    Comment: CRITICAL RESULT CALLED TO, READ BACK BY AND VERIFIED WITH: Hart Robinsons PHARMD [id number] 02/10/2019 HNM    Staphylococcus aureus (BCID) DETECTED (A) NOT DETECTED Final    Comment: Methicillin (oxacillin)-resistant Staphylococcus aureus (MRSA). MRSA is predictably resistant to beta-lactam antibiotics (except ceftaroline). Preferred therapy is vancomycin unless clinically contraindicated. Patient requires contact precautions if  hospitalized. CRITICAL RESULT CALLED TO, READ BACK BY AND VERIFIED WITH: Hart Robinsons PHARMD [id number] 02/10/2019 HNM    Methicillin resistance DETECTED (A) NOT DETECTED Final    Comment: CRITICAL RESULT CALLED TO, READ BACK BY AND VERIFIED WITH: Hart Robinsons PHARMD [id number] 02/10/2019 HNM    Streptococcus species NOT DETECTED NOT DETECTED Final   Streptococcus agalactiae NOT DETECTED NOT DETECTED Final   Streptococcus pneumoniae NOT DETECTED NOT DETECTED Final   Streptococcus pyogenes NOT DETECTED NOT DETECTED Final   Acinetobacter baumannii NOT DETECTED NOT DETECTED Final   Enterobacteriaceae species NOT DETECTED NOT DETECTED  Final   Enterobacter cloacae complex NOT DETECTED NOT DETECTED Final   Escherichia coli NOT DETECTED NOT DETECTED Final   Klebsiella oxytoca NOT DETECTED NOT DETECTED Final   Klebsiella pneumoniae NOT DETECTED NOT DETECTED Final   Proteus species NOT DETECTED NOT DETECTED Final   Serratia marcescens NOT DETECTED NOT DETECTED Final   Haemophilus influenzae NOT DETECTED NOT DETECTED Final   Neisseria meningitidis NOT DETECTED NOT DETECTED Final   Pseudomonas aeruginosa NOT DETECTED NOT DETECTED Final   Candida albicans NOT DETECTED NOT DETECTED Final   Candida glabrata NOT DETECTED NOT DETECTED Final   Candida krusei NOT DETECTED NOT DETECTED Final   Candida parapsilosis NOT DETECTED NOT DETECTED Final   Candida tropicalis NOT DETECTED NOT DETECTED Final    Comment: Performed at [facility], [place], [place] [address]  SARS CORONAVIRUS 2 (TAT 6-24 HRS) Nasopharyngeal Nasopharyngeal Swab     Status: None   Collection Time: 02/09/19  2:54 PM   Specimen: Nasopharyngeal Swab  Result Value Ref Range Status   SARS Coronavirus 2 NEGATIVE NEGATIVE Final    Comment: (NOTE) SARS-CoV-2 target nucleic acids are NOT DETECTED. The SARS-CoV-2 RNA is generally detectable in upper and lower respiratory specimens during the acute phase of infection. Negative results do not preclude SARS-CoV-2 infection, do not rule out co-infections with other pathogens, and should not be used as the sole basis for treatment or other patient management decisions. Negative results must be combined with clinical observations, patient history, and epidemiological information. The expected result is Negative. Fact Sheet for Patients: SugarRoll.be Fact Sheet for Healthcare Providers: https://www.woods-mathews.com/ This test is not yet approved or cleared by the [place] FDA and  has been authorized for detection and/or diagnosis of SARS-CoV-2 by FDA under an Emergency Use Authorization (EUA). This EUA will remain  in effect (meaning this test can be used) for the duration of the COVID-19 declaration under Section 56 4(b)(1) of the Act, 21 U.S.C. section 360bbb-3(b)(1), unless the authorization is terminated or revoked sooner. Performed at [facility], [place] [address] [place], [place] [address]   MRSA PCR Screening     Status: Abnormal   Collection Time: 02/10/19  5:03 PM   Specimen: Nasopharyngeal  Result Value Ref Range Status   MRSA by PCR POSITIVE (A) NEGATIVE Final    Comment:        The GeneXpert MRSA Assay (FDA approved for NASAL specimens only), is one component of a comprehensive MRSA colonization surveillance program. It is not intended to diagnose MRSA infection nor to guide or monitor treatment for MRSA infections. RESULT CALLED  TO, READ BACK BY AND VERIFIED WITH: ALEKSEY FRASER @1839  02/10/19 MJU Performed at [facility], [address] [place], [place] [address]     Best Practice/Protocols:  VTE Prophylaxis: Heparin (SQ)   Events:   Studies: Ct Head Wo Contrast  Result Date: 02/09/2019 CLINICAL DATA:  Altered level of consciousness, altered mental status EXAM: CT HEAD WITHOUT CONTRAST TECHNIQUE: Contiguous axial images were obtained from the base of the skull through the vertex without intravenous contrast. COMPARISON:  CT head August 23, 2018, MRI October 10, 2017 FINDINGS: Brain: No evidence of acute infarction, hemorrhage, hydrocephalus, extra-axial collection or mass lesion/mass effect. Symmetric prominence of the ventricles, cisterns and sulci compatible with parenchymal volume loss. Patchy areas of white matter hypoattenuation are most compatible with chronic microvascular angiopathy. No Vascular: Atherosclerotic calcification of the carotid siphons and intradural vertebral arteries. No hyperdense vessel.  Skull: No calvarial fracture or suspicious osseous lesion. No scalp swelling or hematoma. Sinuses/Orbits: Paranasal sinuses and mastoid air cells are predominantly clear. Orbital structures are unremarkable aside from prior lens extractions. Other: None IMPRESSION: 1. No acute intracranial abnormality. 2. Mild parenchymal atrophy and chronic microvascular angiopathy. Electronically Signed   By: Lovena Le M.D.   On: 02/09/2019 15:33   US Venous Img Upper Uni Left (dvt)  Result Date: 02/11/2019 CLINICAL DATA:  Left upper extremity edema. History of pulmonary embolism. Patient is currently on anticoagulation. Evaluate for DVT. EXAM: LEFT UPPER EXTREMITY VENOUS DOPPLER ULTRASOUND TECHNIQUE: Gray-scale sonography with graded compression, as well as color Doppler and duplex ultrasound were performed to evaluate the upper extremity deep venous system from the level of the subclavian vein and including the  jugular, axillary, basilic, radial, ulnar and upper cephalic vein. Spectral Doppler was utilized to evaluate flow at rest and with distal augmentation maneuvers. COMPARISON:  None. FINDINGS: Contralateral Subclavian Vein: Respiratory phasicity is normal and symmetric with the symptomatic side. No evidence of thrombus. Normal compressibility. Internal Jugular Vein: No evidence of thrombus. Normal compressibility, respiratory phasicity and response to augmentation. Subclavian Vein: No evidence of thrombus. Normal compressibility, respiratory phasicity and response to augmentation. Axillary Vein: No evidence of thrombus. Normal compressibility, respiratory phasicity and response to augmentation. Cephalic Vein: No evidence of thrombus. Normal compressibility, respiratory phasicity and response to augmentation. Basilic Vein: No evidence of thrombus. Normal compressibility, respiratory phasicity and response to augmentation. Brachial Veins: There is age-indeterminate hypoechoic occlusive thrombus within the mid humeral aspect of one of the paired left brachial veins (image 19). The adjacent brachial vein appears patent where imaged. Radial Veins: No evidence of thrombus. Normal compressibility, respiratory phasicity and response to augmentation. Ulnar Veins: No evidence of thrombus. Normal compressibility, respiratory phasicity and response to augmentation. Venous Reflux:  None visualized. Other Findings:  None visualized. IMPRESSION: Examination is positive for age-indeterminate short segment occlusive DVT involving the mid humeral aspect of one of the paired brachial veins. There is no extension of this short-segment occlusive DVT to the more proximal venous system of the left upper extremity. Electronically Signed   By: Sandi Mariscal M.D.   On: 02/11/2019 11:34   Dg Chest Port 1 View  Result Date: 02/09/2019 CLINICAL DATA:  Chest pain EXAM: PORTABLE CHEST 1 VIEW COMPARISON:  08/23/2018 FINDINGS: There is bilateral  mild interstitial thickening. There is a trace left pleural effusion. There is no focal consolidation. There is no pneumothorax. There is stable cardiomegaly. There is evidence of prior CABG. There is no acute osseous abnormality. IMPRESSION: Cardiomegaly with mild pulmonary vascular congestion. Electronically Signed   By: Kathreen Devoid   On: 02/09/2019 14:26    Consults: Treatment Team:  Murlean Iba, MD Flora Lipps, MD   Subjective:    Overnight Issues: Slightly more interactive.  Twitching of the muscles of the lower jaw.  Occasionally moans that she is in pain but cannot localize.  Objective:  Vital signs for last 24 hours: Temp:  [97.6 F (36.4 C)-98.2 F (36.8 C)] 97.9 F (36.6 C) (12/09 1704) Pulse Rate:  [60-84] 61 (12/09 1800) Resp:  [11-50] 32 (12/09 1800) BP: (73-132)/(35-82) 113/36 (12/09 1800) SpO2:  [92 %-100 %] 100 % (12/09 1800) Arterial Line BP: (76-164)/(31-62) 132/39 (12/09 1704)  Hemodynamic parameters for last 24 hours:    Intake/Output from previous day: 12/08 0701 - 12/09 0700 In: 14.1 [I.V.:14.1] Out: -1117   Intake/Output this shift: Total I/O In: 306.6 [I.V.:6.6; IV Piggyback:300]  Out: -   Vent settings for last 24 hours:    Physical Exam:  General: Chronically ill-appearing, lying in bed  HEENT moist oral mucous membranes  Pulm/lungs normal breathing effort, [place] O2, clear  CVS/Heart irregular rhythm  Abdomen: Soft, nontender, protuberant  Extremities: Trace edema, left arm dependent edema  Neurologic: Confused, occasionally moaning, answers questions with monosyllables, no obvious focality.  Twitching of the muscles of the lower jaw.  Skin: Warm/dry, heel deep pressure injury noted on the right, left unstageable pressure injury on the heel, buttocks stage II pressure injury, laterality left.  Noted by nursing present POA  Access: AV fistula, right arm     Assessment/Plan:  Severe metabolic encephalopathy due to severe  sepsis Adult failure to thrive Deconditioning post COVID-19 infection High risk for intubation continue to monitor EEG, consider MRI if develops localizing signs Supportive care Avoid sedatives  ESRD on dialysis Dialysis per renal Medications dosed accordingly Electrolyte replacement as needed  Severe sepsis with septic shock MRSA Bacteremia use vasopressors to keep MAP>65 follow ABG and LA follow up cultures Continue vancomycin Volume resuscitation according to CVP  CARDIAC ICU monitoring No evidence of cardiac dysfunction  ID continue IV abx as per ID follow up cultures   NUTRITIONAL DIET-->NPO>> swallow eval as able Constipation protocol as indicated   ENDO  ICU hypoglycemic\Hyperglycemia protocol if needed   ELECTROLYTES follow labs as needed replace as needed pharmacy consulted and following    LOS: 2 days   Additional comments: Updated patient's husband at bedside  Critical Care Total Time*:   C. Derrill Kay, MD [place] [facility] 02/11/2019  *Care during the described time interval was provided by me and/or other providers on the critical care team.  I have reviewed this patient's available data, including medical history, events of note, physical examination and test results as part of my evaluation.   **This note was dictated using voice recognition software/Dragon.  Despite best efforts to proofread, errors can occur which can change the meaning.  Any change was purely unintentional.

## 2019-02-11 NOTE — Procedures (Signed)
Patient Name: [name]  MRN: [id number]  Epilepsy Attending: Lora Havens  Referring Physician/Provider: Dr Vernard Gambles Date: 02/11/2019 Duration: 36.78mins  Patient history: 63yo F with ams and jaw twitching. EEG to evaluate for seizure  Level of alertness: comatose  AEDs during EEG study: None  Technical aspects: This EEG study was done with scalp electrodes positioned according to the 10-20 International system of electrode placement. Electrical activity was acquired at a sampling rate of 500Hz  and reviewed with a high frequency filter of 70Hz  and a low frequency filter of 1Hz . EEG data were recorded continuously and digitally stored.   DESCRIPTION: EEG showed continuous generalized 3-5hz  theta-delta slowing. Triphasic waves, generalized, maximal bifrontal, at 1.5-2Hz  were also seen. Multiple episodes of jaw twitching were recorded without eeg change. Hyperventilation and photic stimulation were not performed.  ABNORMALITY - Continuous slow, generalized - Triphasic waves, generalized  IMPRESSION: This study is suggestive of severe diffuse encephalopathy, non specific to etiology but could be secondary to toxic-metabolic causes. Multiple episodes of jaw twitching were recorded without EEG correlate and were most likely non epileptic. No seizures or epileptiform discharges were seen throughout the recording.     Leoma Folds Barbra Sarks

## 2019-02-11 NOTE — Progress Notes (Signed)
ID Pt is still lethargic  But slightly more responsive today She was able to tell her husband's name but was perseverating  BPA alerted loose type 7 stool   O/E lethargic .BP (!) 130/44   Pulse 67   Temp 97.9 F (36.6 C) (Axillary)   Resp (!) 21   Ht 5\' 4"  (1.626 m)   Wt 78.1 kg   SpO2 97%   BMI 29.55 kg/m  Some involuntary movt of her jaw and rt arm Chest B/l air entry Rt arm -fistula RT femoral A line RT femoral CVC triple lumen Abd soft  CNS - she does not move any of her limbs actively  Labs CBC Latest Ref Rng & Units 02/11/2019 02/10/2019 02/09/2019  WBC 4.0 - 10.5 K/uL 14.4(H) 6.7 15.8(H)  Hemoglobin 12.0 - 15.0 g/dL 8.8(L) 9.0(L) 10.7(L)  Hematocrit 36.0 - 46.0 % 26.5(L) 26.6(L) 34.1(L)  Platelets 150 - 400 K/uL 140(L) 172 215    CMP Latest Ref Rng & Units 02/11/2019 02/10/2019 02/09/2019  Glucose 70 - 99 mg/dL 173(H) 88 101(H)  BUN 8 - 23 mg/dL 12 17 16   Creatinine 0.44 - 1.00 mg/dL 3.01(H) 4.12(H) 5.04(H)  Sodium 135 - 145 mmol/L 142 140 140  Potassium 3.5 - 5.1 mmol/L 2.9(L) 3.0(L) 3.7  Chloride 98 - 111 mmol/L 101 99 99  CO2 22 - 32 mmol/L 28 27 23   Calcium 8.9 - 10.3 mg/dL 7.8(L) 7.9(L) 8.8(L)  Total Protein 6.5 - 8.1 g/dL - 5.2(L) 6.1(L)  Total Bilirubin 0.3 - 1.2 mg/dL - 1.2 1.3(H)  Alkaline Phos 38 - 126 U/L - 92 105  AST 15 - 41 U/L - 70(H) 92(H)  ALT 0 - 44 U/L - 35 40    MICRO 12/7 4/4 BC MRSA   Impression/Recommendation  MRSA bacteremia  Her risk for infection include AV fistula ( with some malfunctioning needing Balloon venoplasty in Aug at [facility]- also has a previous stent in the fistula) Repeat Blood culture sent today 2 d echo- valves look okay Will need TEE She as no prosthetic valves or previous h/o endocarditis Continue vancomycin  ESRD on dialysis  Encephalopathy- recurrent since her COVID infection ? Due infection VS uremia No CVA   COVID illness in July and has been seriously deconditioned since then  ? Paroxysmal Afib was  on amiodarone as OP.   Has type 7 loose stools- BPA alert- will try to get a cdiff test because of frequent hospitalizations and she had abdominal apin before admission  Discussed with her nurse to collect stool today- if no diarrhea will DC order

## 2019-02-11 NOTE — Progress Notes (Signed)
eeg completed ° °

## 2019-02-11 NOTE — Consult Note (Signed)
Pharmacy Antibiotic Note  Jamie Arias is a 63 y.o. female admitted on 02/09/2019 with MRSA  bacteremia. Patient has history of ESRD on HD MWF. Missed dialysis yesterday. Blood cultures with 4/4 GPC and BCID detected MRSA. Source dialysis catheter? Pharmacy has been consulted for vancomycin dosing. ID consulted. This is day # 3 IV vancomycin with remaining leukocytosis but no recent febrile episodes. Her normal HD schedule is MWF, however Dr Candiss Norse has deferred treatment until tomorrow  Plan:  vancomycin 100 mg x 1 tomorrow with dialysis  Continue to monitor dialysis plan  Plan to get trough level after 3-4 maintenance doses  Goal pre-HD vancomycin level 15 - 25 mcg/mL  Height: 5\' 4"  (162.6 cm) Weight: 172 lb 2.9 oz (78.1 kg) IBW/kg (Calculated) : 54.7  Temp (24hrs), Avg:98.2 F (36.8 C), Min:97.7 F (36.5 C), Max:99.3 F (37.4 C)  Recent Labs  Lab 02/09/19 1143 02/09/19 1438 02/09/19 1836 02/10/19 0500 02/10/19 2018 02/11/19 0825  WBC 15.8*  --   --  6.7  --  14.4*  CREATININE 5.04*  --   --  4.12*  --  3.01*  LATICACIDVEN  --  2.6* 1.4  --  1.0  --     Estimated Creatinine Clearance: 18.9 mL/min (A) (by C-G formula based on SCr of 3.01 mg/dL (H)).    Allergies  Allergen Reactions  . Nsaids Other (See Comments)    Contraindicated due to kidney disease.  Marland Kitchen Doxycycline Other (See Comments)    tremor    Antimicrobials this admission: Cefepime and metronidazole 12/7 x1  Pip/tazo 12/8 x1 Vancomycin 12/7 >>   Dose adjustments this admission: 12/8: Increased maintenance dose from 500 mg to 1000 mg qHD based on up-dated weight in chart  Microbiology results: 12/9 BCx: NGTD 12/7 BCx: 4/4 bottles GPC 12/7 BCID: MRSA 12/7 UCx: pending 12/7 SARS-CoV-2 NAAT: negative  Thank you for allowing pharmacy to be a part of this patient's care.  Dallie Piles  02/11/2019 12:07 PM

## 2019-02-11 NOTE — Progress Notes (Signed)
[place], [place] 02/11/19  Subjective:   Hospital day # 2 Patient presented to the emergency room via EMS for abdominal pain and altered mental status.  Work-up so far shows negative ammonia, mildly elevated troponin, negative for alcohol.      Neuro: Came in with altered mental status and confusion.  Appears to be a bit more awake today cvs: Mouth appears still dry.  Off levophed since last night Pulm: Requiring O2 by [place]. gi: No nausea or vomiting reported Renal:  Dialysis yesterday. 500 cc bolus given Potassium is low today ID: MRSA positive isolation    Objective:  Vital signs in last 24 hours:  Temp:  [97.7 F (36.5 C)-99.3 F (37.4 C)] 98 F (36.7 C) (12/09 0821) Pulse Rate:  [60-90] 74 (12/09 0821) Resp:  [10-32] 29 (12/09 0821) BP: (68-161)/(35-86) 109/35 (12/09 0800) SpO2:  [92 %-100 %] 100 % (12/09 0821) Arterial Line BP: (76-164)/(31-62) 164/62 (12/09 0821) Weight:  [78.1 kg-80.1 kg] 78.1 kg (12/08 1700)  Weight change: 20.5 kg Filed Weights   02/10/19 0847 02/10/19 1243 02/10/19 1700  Weight: 79.5 kg 80.1 kg 78.1 kg    Intake/Output:    Intake/Output Summary (Last 24 hours) at 02/11/2019 1000 Last data filed at 02/10/2019 2356 Gross per 24 hour  Intake 14.14 ml  Output -1117 ml  Net 1131.14 ml     Physical Exam: General:  Critically ill-appearing, lying in the bed  HEENT  dry oral mucous membranes  Pulm/lungs  normal breathing effort, [place] O2, clear  CVS/Heart  irregular rhythm  Abdomen:   Soft, nontender  Extremities:  Trace edema, left arm dependent edema  Neurologic:  more alert than yesterday, followed a few simple commands  Skin:  Warm  Access:  AV fistula, right arm       Basic Metabolic Panel:  Recent Labs  Lab 02/09/19 1143 02/10/19 0500 02/11/19 0825  NA 140 140 142  K 3.7 3.0* 2.9*  CL 99 99 101  CO2 23 27 28   GLUCOSE 101* 88 173*  BUN 16 17 12   CREATININE 5.04* 4.12* 3.01*  CALCIUM 8.8* 7.9*  7.8*  MG  --   --  1.7     CBC: Recent Labs  Lab 02/09/19 1143 02/10/19 0500 02/11/19 0825  WBC 15.8* 6.7 14.4*  NEUTROABS 14.9*  --   --   HGB 10.7* 9.0* 8.8*  HCT 34.1* 26.6* 26.5*  MCV 85.0 81.6 81.5  PLT 215 172 140*      Lab Results  Component Value Date   HEPBSAG NON REACTIVE 02/10/2019      Microbiology:  Recent Results (from the past 240 hour(s))  Culture, blood (routine x 2)     Status: None (Preliminary result)   Collection Time: 02/09/19  2:29 PM   Specimen: BLOOD LEFT ARM  Result Value Ref Range Status   Specimen Description   Final    BLOOD LEFT ARM Performed at [facility], [address] [place], [place] [address]    Special Requests   Final    BOTTLES DRAWN AEROBIC AND ANAEROBIC Blood Culture adequate volume Performed at [facility], [address] [place], [place] [address]    Culture  Setup Time   Final    GRAM POSITIVE COCCI IN BOTH AEROBIC AND ANAEROBIC BOTTLES CRITICAL VALUE NOTED.  VALUE IS CONSISTENT WITH PREVIOUSLY REPORTED AND CALLED VALUE. Performed at [facility], [address], [place], [place] [address]    Culture Lonell Grandchild  POSITIVE COCCI  Final   Report Status PENDING  Incomplete  Culture, blood (routine x 2)     Status: Abnormal (Preliminary result)   Collection Time: 02/09/19  2:38 PM   Specimen: BLOOD LEFT ARM  Result Value Ref Range Status   Specimen Description   Final    BLOOD LEFT ARM Performed at [facility], [address] [place], [place] [address]    Special Requests   Final    BOTTLES DRAWN AEROBIC AND ANAEROBIC Blood Culture adequate volume Performed at [facility], [address], [place], [place] [address]    Culture  Setup Time   Final    GRAM POSITIVE COCCI IN BOTH AEROBIC AND ANAEROBIC BOTTLES CRITICAL RESULT CALLED TO, READ BACK BY AND VERIFIED WITH: Hart Robinsons PHARMD [id number] 02/10/2019 HNM    Culture (A)  Final    STAPHYLOCOCCUS  AUREUS SUSCEPTIBILITIES TO FOLLOW Performed at [facility], [place] [address], [place], [place] [address]    Report Status PENDING  Incomplete  Blood Culture ID Panel (Reflexed)     Status: Abnormal   Collection Time: 02/09/19  2:38 PM  Result Value Ref Range Status   Enterococcus species NOT DETECTED NOT DETECTED Final   Listeria monocytogenes NOT DETECTED NOT DETECTED Final   Staphylococcus species DETECTED (A) NOT DETECTED Final    Comment: CRITICAL RESULT CALLED TO, READ BACK BY AND VERIFIED WITH: Hart Robinsons PHARMD [id number] 02/10/2019 HNM    Staphylococcus aureus (BCID) DETECTED (A) NOT DETECTED Final    Comment: Methicillin (oxacillin)-resistant Staphylococcus aureus (MRSA). MRSA is predictably resistant to beta-lactam antibiotics (except ceftaroline). Preferred therapy is vancomycin unless clinically contraindicated. Patient requires contact precautions if  hospitalized. CRITICAL RESULT CALLED TO, READ BACK BY AND VERIFIED WITH: Hart Robinsons PHARMD [id number] 02/10/2019 HNM    Methicillin resistance DETECTED (A) NOT DETECTED Final    Comment: CRITICAL RESULT CALLED TO, READ BACK BY AND VERIFIED WITH: Hart Robinsons PHARMD [id number] 02/10/2019 HNM    Streptococcus species NOT DETECTED NOT DETECTED Final   Streptococcus agalactiae NOT DETECTED NOT DETECTED Final   Streptococcus pneumoniae NOT DETECTED NOT DETECTED Final   Streptococcus pyogenes NOT DETECTED NOT DETECTED Final   Acinetobacter baumannii NOT DETECTED NOT DETECTED Final   Enterobacteriaceae species NOT DETECTED NOT DETECTED Final   Enterobacter cloacae complex NOT DETECTED NOT DETECTED Final   Escherichia coli NOT DETECTED NOT DETECTED Final   Klebsiella oxytoca NOT DETECTED NOT DETECTED Final   Klebsiella pneumoniae NOT DETECTED NOT DETECTED Final   Proteus species NOT DETECTED NOT DETECTED Final   Serratia marcescens NOT DETECTED NOT DETECTED Final   Haemophilus influenzae NOT DETECTED NOT DETECTED Final   Neisseria meningitidis  NOT DETECTED NOT DETECTED Final   Pseudomonas aeruginosa NOT DETECTED NOT DETECTED Final   Candida albicans NOT DETECTED NOT DETECTED Final   Candida glabrata NOT DETECTED NOT DETECTED Final   Candida krusei NOT DETECTED NOT DETECTED Final   Candida parapsilosis NOT DETECTED NOT DETECTED Final   Candida tropicalis NOT DETECTED NOT DETECTED Final    Comment: Performed at [facility], [place], [place] [address]  SARS CORONAVIRUS 2 (TAT 6-24 HRS) Nasopharyngeal Nasopharyngeal Swab     Status: None   Collection Time: 02/09/19  2:54 PM   Specimen: Nasopharyngeal Swab  Result Value Ref Range Status   SARS Coronavirus 2 NEGATIVE NEGATIVE Final    Comment: (NOTE) SARS-CoV-2 target nucleic acids are NOT DETECTED. The SARS-CoV-2 RNA is generally detectable in  upper and lower respiratory specimens during the acute phase of infection. Negative results do not preclude SARS-CoV-2 infection, do not rule out co-infections with other pathogens, and should not be used as the sole basis for treatment or other patient management decisions. Negative results must be combined with clinical observations, patient history, and epidemiological information. The expected result is Negative. Fact Sheet for Patients: SugarRoll.be Fact Sheet for Healthcare Providers: https://www.woods-mathews.com/ This test is not yet approved or cleared by the [place] FDA and  has been authorized for detection and/or diagnosis of SARS-CoV-2 by FDA under an Emergency Use Authorization (EUA). This EUA will remain  in effect (meaning this test can be used) for the duration of the COVID-19 declaration under Section 56 4(b)(1) of the Act, 21 U.S.C. section 360bbb-3(b)(1), unless the authorization is terminated or revoked sooner. Performed at [facility], [place] [address] [place], [place] [address]   MRSA PCR Screening     Status: Abnormal   Collection  Time: 02/10/19  5:03 PM   Specimen: Nasopharyngeal  Result Value Ref Range Status   MRSA by PCR POSITIVE (A) NEGATIVE Final    Comment:        The GeneXpert MRSA Assay (FDA approved for NASAL specimens only), is one component of a comprehensive MRSA colonization surveillance program. It is not intended to diagnose MRSA infection nor to guide or monitor treatment for MRSA infections. RESULT CALLED TO, READ BACK BY AND VERIFIED WITH: ALEKSEY FRASER @1839  02/10/19 MJU Performed at [facility], [address] [place], [place] [address]     Coagulation Studies: Recent Labs    02/09/19 1143 02/10/19 0500  LABPROT 13.8 13.6  INR 1.1 1.1    Urinalysis: No results for input(s): COLORURINE, LABSPEC, PHURINE, GLUCOSEU, HGBUR, BILIRUBINUR, KETONESUR, PROTEINUR, UROBILINOGEN, NITRITE, LEUKOCYTESUR in the last 72 hours.  Invalid input(s): APPERANCEUR    Imaging: Ct Head Wo Contrast  Result Date: 02/09/2019 CLINICAL DATA:  Altered level of consciousness, altered mental status EXAM: CT HEAD WITHOUT CONTRAST TECHNIQUE: Contiguous axial images were obtained from the base of the skull through the vertex without intravenous contrast. COMPARISON:  CT head August 23, 2018, MRI October 10, 2017 FINDINGS: Brain: No evidence of acute infarction, hemorrhage, hydrocephalus, extra-axial collection or mass lesion/mass effect. Symmetric prominence of the ventricles, cisterns and sulci compatible with parenchymal volume loss. Patchy areas of white matter hypoattenuation are most compatible with chronic microvascular angiopathy. No Vascular: Atherosclerotic calcification of the carotid siphons and intradural vertebral arteries. No hyperdense vessel. Skull: No calvarial fracture or suspicious osseous lesion. No scalp swelling or hematoma. Sinuses/Orbits: Paranasal sinuses and mastoid air cells are predominantly clear. Orbital structures are unremarkable aside from prior lens extractions. Other: None  IMPRESSION: 1. No acute intracranial abnormality. 2. Mild parenchymal atrophy and chronic microvascular angiopathy. Electronically Signed   By: Lovena Le M.D.   On: 02/09/2019 15:33   Dg Chest Port 1 View  Result Date: 02/09/2019 CLINICAL DATA:  Chest pain EXAM: PORTABLE CHEST 1 VIEW COMPARISON:  08/23/2018 FINDINGS: There is bilateral mild interstitial thickening. There is a trace left pleural effusion. There is no focal consolidation. There is no pneumothorax. There is stable cardiomegaly. There is evidence of prior CABG. There is no acute osseous abnormality. IMPRESSION: Cardiomegaly with mild pulmonary vascular congestion. Electronically Signed   By: Kathreen Devoid   On: 02/09/2019 14:26     Medications:   . sodium chloride 250 mL (02/10/19 0054)  . norepinephrine (LEVOPHED) Adult infusion 1 mcg/min (02/10/19  2356)  . vancomycin     . Alirocumab  75 mg Subcutaneous Q14 Days  . amiodarone  200 mg Oral Daily  . aspirin EC  81 mg Oral Daily  . atorvastatin  40 mg Oral q1800  . chlorhexidine  15 mL Mouth Rinse BID  . Chlorhexidine Gluconate Cloth  6 each Topical Q0600  . ferrous sulfate  325 mg Oral Q breakfast  . FLUoxetine  20 mg Oral Daily  . fluticasone  2 spray Each Nare Daily  . heparin  5,000 Units Subcutaneous Q8H  . insulin aspart  0-9 Units Subcutaneous TID WC  . levothyroxine  88 mcg Oral QAC breakfast  . mouth rinse  15 mL Mouth Rinse q12n4p  . midodrine  10 mg Oral TID WC  . multivitamin  1 tablet Oral QHS  . pantoprazole  40 mg Oral Daily  . sodium chloride flush  3 mL Intravenous Once  . venlafaxine  75 mg Oral BID WC  . ascorbic acid  500 mg Oral Daily  . zinc sulfate  220 mg Oral Daily   sodium chloride, ondansetron (ZOFRAN) IV  Assessment/ Plan:  63 y.o. female with end-stage renal disease, atrial fibrillation, chronic diastolic congestive heart failure, CAD status post PCI, CABG,  type 2 diabetes, hypertension, hyperlipidemia, hypothyroidism, Covid positive  in July 2020, recent nursing home stay in [place]  NEPHROLOGIST: Lavonia Dana MD  LOCATION: [place]: M-W-F 2nd Shift  EDW: 81.5 kg.  KIDNEY: [place] Optiflux 180NR  LITERS PROC: liters/treatment  HD TIME: 225  ACCESS: R Upper Arm AVF  NEEDLE SIZE:  ANTICOAG: Custom Heparin 2000  BATH: 2K/2.5Ca  QB: 400 ml/min  QD: 600 ml/min   1.  End-stage renal disease - Make up dialysis treatment yesterday - Patient still appears dry. Potassium low - defer dialysis today - will evaluate daily for HD - continue midodrine prior to HD  2.  Anemia of chronic kidney disease anemia of chronic kidney disease Lab Results  Component Value Date   HGB 8.8 (L) 02/11/2019  - will consider adding EPO with HD treatments  3. SHPTH - hold binders and non essential meds  4. Altered mental status - work up in progress, likely due to sepsis - appears to be improving some  5. Sepsis - blood culture positive for MRSA from 02/09/2019  ? Source; ID team following  6. Hypokalemia Intermittent iv replacement as needed       LOS: 2 Teondra Newburg 12/9/202010:00 AM  [place], [place]  Note: This note was prepared with Dragon dictation. Any transcription errors are unintentional

## 2019-02-11 NOTE — Progress Notes (Signed)
SLP Cancellation Note  Patient Details Name: Jamie Arias MRN: [id number] DOB: 1955/07/04   Cancelled treatment:       Reason Eval/Treat Not Completed: Patient not medically ready;Fatigue/lethargy limiting ability to participate(chart reviewed; consulted MD/NSG). Pt continues to present w/ decreased alertness and Confusion; AMS. She is not following instructions well, and rapid, shallow respirations noted (35-40bpm).  Recommend holding on BSE and oral intake at this time. ST services will return tomorrow to assess appropriateness for BSE. Recommend frequent oral care for hygiene and stimulation of swallowing; general aspiration precautions. NSG updated.     Orinda Kenner, MS, CCC-SLP Shaden Lacher 02/11/2019, 1:15 PM

## 2019-02-11 NOTE — Progress Notes (Signed)
PT Cancellation Note  Patient Details Name: Jamie Arias MRN: [id number] DOB: 1955/07/20   Cancelled Treatment:    Reason Eval/Treat Not Completed: Patient's level of consciousness;Fatigue/lethargy limiting ability to participate;Patient not medically ready(Chart reviewed, RN consulted. RN reports patient remains disoriented and altered, unable to paticipate enough for evaluation. Pt has transferred to SDU since PT order placed. Dr. Duwayne Heck reports a new order will be placed once pt is ready to resume therapy. PT signing off at this time, will await new order.   9:15 AM, 02/11/19 Etta Grandchild, PT, DPT Physical Therapist - [facility]  [phone] ([place])    Jessye Imhoff C 02/11/2019, 9:14 AM

## 2019-02-11 NOTE — Progress Notes (Signed)
OT Cancellation Note  Patient Details Name: NUBE VANOSTEN MRN: [id number] DOB: [birth year]   Cancelled Treatment:    Reason Eval/Treat Not Completed: Fatigue/lethargy limiting ability to participate;Patient not medically ready  OT consult received and chart reviewed. Pt with t/f to ICU secondary to hypotension s/p HD. Pt with fem art line placed for frequent blood draws, Invasive BP monitoring, and IV therapy needed longer than 5 days. Per PT, upon speaking with RN, pt lethargic and disoriented. Pt not appropriate for therapy evaluation/participation at this time. Per MD guidance (Dr. Duwayne Heck), will complete order at this time and await new order as pt becomes more medically appropriate. Thank you.  Gerrianne Scale, [place], OTR/L ascom [phone] 02/11/19, 9:19 AM

## 2019-02-12 ENCOUNTER — Inpatient Hospital Stay: Payer: Medicare Other

## 2019-02-12 DIAGNOSIS — L84 Corns and callosities: Secondary | ICD-10-CM

## 2019-02-12 DIAGNOSIS — Z95828 Presence of other vascular implants and grafts: Secondary | ICD-10-CM

## 2019-02-12 DIAGNOSIS — E876 Hypokalemia: Secondary | ICD-10-CM

## 2019-02-12 LAB — CULTURE, BLOOD (ROUTINE X 2)
Special Requests: ADEQUATE
Special Requests: ADEQUATE

## 2019-02-12 LAB — RENAL FUNCTION PANEL
Albumin: 1.9 g/dL — ABNORMAL LOW (ref 3.5–5.0)
Anion gap: 12 (ref 5–15)
BUN: 16 mg/dL (ref 8–23)
CO2: 30 mmol/L (ref 22–32)
Calcium: 8.2 mg/dL — ABNORMAL LOW (ref 8.9–10.3)
Chloride: 103 mmol/L (ref 98–111)
Creatinine, Ser: 3.74 mg/dL — ABNORMAL HIGH (ref 0.44–1.00)
GFR calc Af Amer: 14 mL/min — ABNORMAL LOW (ref >60)
GFR calc non Af Amer: 12 mL/min — ABNORMAL LOW (ref >60)
Glucose, Bld: 118 mg/dL — ABNORMAL HIGH (ref 70–99)
Phosphorus: 2 mg/dL — ABNORMAL LOW (ref 2.5–4.6)
Potassium: 2.6 mmol/L — CL (ref 3.5–5.1)
Sodium: 145 mmol/L (ref 135–145)

## 2019-02-12 LAB — CBC
HCT: 24.4 % — ABNORMAL LOW (ref 36.0–46.0)
Hemoglobin: 7.8 g/dL — ABNORMAL LOW (ref 12.0–15.0)
MCH: 27 pg (ref 26.0–34.0)
MCHC: 32 g/dL (ref 30.0–36.0)
MCV: 84.4 fL (ref 80.0–100.0)
Platelets: 131 10*3/uL — ABNORMAL LOW (ref 150–400)
RBC: 2.89 MIL/uL — ABNORMAL LOW (ref 3.87–5.11)
RDW: 19.9 % — ABNORMAL HIGH (ref 11.5–15.5)
WBC: 8.2 10*3/uL (ref 4.0–10.5)
nRBC: 0 % (ref 0.0–0.2)

## 2019-02-12 LAB — GLUCOSE, CAPILLARY
Glucose-Capillary: 112 mg/dL — ABNORMAL HIGH (ref 70–99)
Glucose-Capillary: 117 mg/dL — ABNORMAL HIGH (ref 70–99)
Glucose-Capillary: 101 mg/dL — ABNORMAL HIGH (ref 70–99)
Glucose-Capillary: 93 mg/dL (ref 70–99)
Glucose-Capillary: 104 mg/dL — ABNORMAL HIGH (ref 70–99)

## 2019-02-12 LAB — MAGNESIUM: Magnesium: 1.7 mg/dL (ref 1.7–2.4)

## 2019-02-12 MED ORDER — ADULT MULTIVITAMIN LIQUID CH
15.0000 mL | Freq: Every day | ORAL | Status: DC
  Filled 2019-02-12 (×5): qty 15

## 2019-02-12 MED ORDER — MUPIROCIN 2 % EX OINT
1.0000 "application " | TOPICAL_OINTMENT | Freq: Two times a day (BID) | CUTANEOUS | Status: AC
  Administered 2019-02-12 – 2019-02-16 (×10): 1 via NASAL
  Filled 2019-02-12: qty 22

## 2019-02-12 MED ORDER — MAGNESIUM SULFATE 2 GM/50ML IV SOLN
2.0000 g | Freq: Once | INTRAVENOUS | Status: AC
  Administered 2019-02-12: 2 g via INTRAVENOUS
  Filled 2019-02-12: qty 50

## 2019-02-12 MED ORDER — ATORVASTATIN CALCIUM 20 MG PO TABS
40.0000 mg | ORAL_TABLET | Freq: Every day | ORAL | Status: DC
  Administered 2019-02-15: 17:00:00 40 mg
  Filled 2019-02-12: qty 2

## 2019-02-12 MED ORDER — FLUOXETINE HCL 20 MG PO CAPS
20.0000 mg | ORAL_CAPSULE | Freq: Every day | ORAL | Status: DC
  Administered 2019-02-17: 20 mg
  Filled 2019-02-12 (×10): qty 1

## 2019-02-12 MED ORDER — POTASSIUM & SODIUM PHOSPHATES 280-160-250 MG PO PACK
1.0000 | PACK | Freq: Once | ORAL | Status: DC
  Filled 2019-02-12: qty 1

## 2019-02-12 MED ORDER — ASPIRIN 81 MG PO CHEW
81.0000 mg | CHEWABLE_TABLET | Freq: Every day | ORAL | Status: DC
  Administered 2019-02-17: 81 mg
  Filled 2019-02-12 (×3): qty 1

## 2019-02-12 MED ORDER — ZINC SULFATE 220 (50 ZN) MG PO CAPS
220.0000 mg | ORAL_CAPSULE | Freq: Every day | ORAL | Status: DC
  Filled 2019-02-12 (×4): qty 1

## 2019-02-12 MED ORDER — STERILE WATER FOR INJECTION IJ SOLN
INTRAMUSCULAR | Status: AC
  Administered 2019-02-12: 10 mL
  Filled 2019-02-12: qty 10

## 2019-02-12 MED ORDER — LEVOTHYROXINE SODIUM 88 MCG PO TABS
88.0000 ug | ORAL_TABLET | Freq: Every day | ORAL | Status: DC
  Filled 2019-02-12 (×9): qty 1

## 2019-02-12 MED ORDER — VENLAFAXINE HCL 37.5 MG PO TABS
75.0000 mg | ORAL_TABLET | Freq: Two times a day (BID) | ORAL | Status: DC
  Administered 2019-02-15 – 2019-02-17 (×2): 75 mg
  Filled 2019-02-12 (×19): qty 2

## 2019-02-12 MED ORDER — MIDODRINE HCL 5 MG PO TABS
10.0000 mg | ORAL_TABLET | Freq: Three times a day (TID) | ORAL | Status: DC
  Administered 2019-02-15 – 2019-02-18 (×5): 10 mg
  Filled 2019-02-12 (×18): qty 2

## 2019-02-12 MED ORDER — CHLORHEXIDINE GLUCONATE CLOTH 2 % EX PADS
6.0000 | MEDICATED_PAD | Freq: Every day | CUTANEOUS | Status: AC
  Administered 2019-02-16: 10:00:00 6 via TOPICAL

## 2019-02-12 MED ORDER — POTASSIUM CHLORIDE 10 MEQ/50ML IV SOLN
10.0000 meq | INTRAVENOUS | Status: AC
  Administered 2019-02-12 (×3): 10 meq via INTRAVENOUS
  Filled 2019-02-12 (×3): qty 50

## 2019-02-12 NOTE — Progress Notes (Signed)
PHARMACY - PHYSICIAN COMMUNICATION CRITICAL VALUE ALERT - BLOOD CULTURE IDENTIFICATION (BCID)  Jamie Arias is an 63 y.o. female who presented to [facility] on 02/09/2019 with a chief complaint of abd pain  Assessment:  Lab reported second instance of + blood cx's, GPC (already known)   Name of physician (or Provider) ContactedTana Conch, NP  Current antibiotics: Vancomycin  Changes to prescribed antibiotics recommended:  Patient is on recommended antibiotics - No changes needed  Results for orders placed or performed during the hospital encounter of 02/09/19  Blood Culture ID Panel (Reflexed) (Collected: 02/09/2019  2:38 PM)  Result Value Ref Range   Enterococcus species NOT DETECTED NOT DETECTED   Listeria monocytogenes NOT DETECTED NOT DETECTED   Staphylococcus species DETECTED (A) NOT DETECTED   Staphylococcus aureus (BCID) DETECTED (A) NOT DETECTED   Methicillin resistance DETECTED (A) NOT DETECTED   Streptococcus species NOT DETECTED NOT DETECTED   Streptococcus agalactiae NOT DETECTED NOT DETECTED   Streptococcus pneumoniae NOT DETECTED NOT DETECTED   Streptococcus pyogenes NOT DETECTED NOT DETECTED   Acinetobacter baumannii NOT DETECTED NOT DETECTED   Enterobacteriaceae species NOT DETECTED NOT DETECTED   Enterobacter cloacae complex NOT DETECTED NOT DETECTED   Escherichia coli NOT DETECTED NOT DETECTED   Klebsiella oxytoca NOT DETECTED NOT DETECTED   Klebsiella pneumoniae NOT DETECTED NOT DETECTED   Proteus species NOT DETECTED NOT DETECTED   Serratia marcescens NOT DETECTED NOT DETECTED   Haemophilus influenzae NOT DETECTED NOT DETECTED   Neisseria meningitidis NOT DETECTED NOT DETECTED   Pseudomonas aeruginosa NOT DETECTED NOT DETECTED   Candida albicans NOT DETECTED NOT DETECTED   Candida glabrata NOT DETECTED NOT DETECTED   Candida krusei NOT DETECTED NOT DETECTED   Candida parapsilosis NOT DETECTED NOT DETECTED   Candida tropicalis NOT DETECTED NOT DETECTED     Hart Robinsons A 02/12/2019  4:13 AM

## 2019-02-12 NOTE — Progress Notes (Signed)
ID Pt remains lethargic Does not talk much  Says a few words like her name Involuntary movt of her jaw and rt arm  BP 130/87   Pulse 64   Temp 98 F (36.7 C) (Oral)   Resp (!) 46   Ht 5\' 4"  (1.626 m)   Wt 78.2 kg   SpO2 98%   BMI 29.59 kg/m    Rt Fistula Chest b/l air entry [id number] Abd soft Left heel callus Has femoral line  CBC Latest Ref Rng & Units 02/12/2019 02/11/2019 02/10/2019  WBC 4.0 - 10.5 K/uL 8.2 14.4(H) 6.7  Hemoglobin 12.0 - 15.0 g/dL 7.8(L) 8.8(L) 9.0(L)  Hematocrit 36.0 - 46.0 % 24.4(L) 26.5(L) 26.6(L)  Platelets 150 - 400 K/uL 131(L) 140(L) 172    CMP Latest Ref Rng & Units 02/12/2019 02/11/2019 02/10/2019  Glucose 70 - 99 mg/dL 118(H) 173(H) 88  BUN 8 - 23 mg/dL 16 12 17   Creatinine 0.44 - 1.00 mg/dL 3.74(H) 3.01(H) 4.12(H)  Sodium 135 - 145 mmol/L 145 142 140  Potassium 3.5 - 5.1 mmol/L 2.6(LL) 2.9(L) 3.0(L)  Chloride 98 - 111 mmol/L 103 101 99  CO2 22 - 32 mmol/L 30 28 27   Calcium 8.9 - 10.3 mg/dL 8.2(L) 7.8(L) 7.9(L)  Total Protein 6.5 - 8.1 g/dL - - 5.2(L)  Total Bilirubin 0.3 - 1.2 mg/dL - - 1.2  Alkaline Phos 38 - 126 U/L - - 92  AST 15 - 41 U/L - - 70(H)  ALT 0 - 44 U/L - - 35    Micro 12/7 BC- MRSA - VAnco MIC 1  02/11/19 BC - 1 of 4 bottle gram positive cocci  Impression/Recommendation  MRSA bacteremia- unclear source- risk facotrs- AV fistula with stent rt arm and also recent venoplasty Left heel callus unlikely the source Repeat BC from 02/11/19 has 1 of 4 bottle positive Will need TEE   ESRD   Encephalopathy   Post covid  - July 20200- deconditioned sinmce then with multiple stay in hospitals and NH- went home 2 weeks ago- bed bound  Hypokalemia- no intake  No significant diarrhea   Discussed the management with her nurse

## 2019-02-12 NOTE — Consult Note (Signed)
PHARMACY CONSULT NOTE - FOLLOW UP  Pharmacy Consult for Electrolyte Monitoring and Replacement   Recent Labs: Potassium (mmol/L)  Date Value  02/12/2019 2.6 (LL)  03/30/2014 4.3   Magnesium (mg/dL)  Date Value  02/12/2019 1.7  07/19/2013 1.7 (L)   Calcium (mg/dL)  Date Value  02/12/2019 8.2 (L)   Calcium, Total (mg/dL)  Date Value  03/30/2014 9.0   Albumin (g/dL)  Date Value  02/12/2019 1.9 (L)  02/11/2018 4.2  07/23/2013 2.7 (L)   Phosphorus (mg/dL)  Date Value  02/12/2019 2.0 (L)   Sodium (mmol/L)  Date Value  02/12/2019 145  03/30/2014 140   Corrected Ca: 9.9 mg/dL  Assessment: 63 y.o. female who presented to [facility] on 02/09/2019 with MRSA bacteremia. The patient received HD today at bedside  Goal of Therapy:  Potassium 4.0 - 5.1 mmol/L Magnesium 2.0 - 2.4 mg/dL All Other Electrolytes WNL  Plan:   IV KCl 10 mEq x 3  4K HD bath per Dr Candiss Norse  IV magnesium sulfate 2 grams x 1  Phos-Nac one packet x1  Renal function panel and magnesium follow-up in am  Dallie Piles ,PharmD Clinical Pharmacist 02/12/2019 7:36 AM

## 2019-02-12 NOTE — Progress Notes (Signed)
Pre HD Assessment    02/12/19 1015  Neurological  Level of Consciousness Responds to Voice  Orientation Level Disoriented X4  Respiratory  Respiratory Pattern Regular;Unlabored  Chest Assessment Chest expansion symmetrical  Bilateral Breath Sounds Diminished  Cough None  Cardiac  Pulse Regular  Heart Sounds S1, S2  ECG Monitor Yes  Cardiac Rhythm NSR  Ectopy PAC  Vascular  R Radial Pulse +2  L Radial Pulse +2  Edema Generalized  Generalized Edema +1  Psychosocial  Psychosocial (WDL) X  Patient Behaviors Not interactive  Emotional support given Given to patient

## 2019-02-12 NOTE — Progress Notes (Signed)
Post HD TX    02/12/19 1445  Vital Signs  Temp 98 F (36.7 C)  Temp Source Oral  Pulse Rate 66  Pulse Rate Source Monitor  Resp (!) 23  BP (!) 148/75  BP Location Left Leg  BP Method Automatic  Patient Position (if appropriate) Lying  Oxygen Therapy  O2 Device Nasal Cannula  O2 Flow Rate (L/min) 2 L/min  Pain Assessment  Pain Scale 0-10  Pain Score 0  Dialysis Weight  Weight 78.2 kg  Type of Weight Post-Dialysis  Post-Hemodialysis Assessment  Rinseback Volume (mL) 250 mL  KECN 57.4 V  Dialyzer Clearance Lightly streaked  Duration of HD Treatment -hour(s) 3 hour(s)  Hemodialysis Intake (mL) 500 mL  UF Total -Machine (mL) 500 mL  Net UF (mL) 0 mL  Tolerated HD Treatment Yes  AVG/AVF Arterial Site Held (minutes) 5 minutes  AVG/AVF Venous Site Held (minutes) 5 minutes  Fistula / Graft Right Upper arm Arteriovenous vein graft  Placement Date/Time: 02/03/16 1210   Placed prior to admission: No  Orientation: Right  Access Location: Upper arm  Access Type: Arteriovenous vein graft  Site Condition No complications  Fistula / Graft Assessment Present;Thrill;Bruit  Status Deaccessed  Drainage Description None

## 2019-02-12 NOTE — Consult Note (Signed)
Pharmacy Antibiotic Note  Jamie Arias is a 63 y.o. female admitted on 02/09/2019 with MRSA  bacteremia. Patient has history of ESRD on HD MWF. Missed dialysis yesterday. Blood cultures with 4/4 GPC and BCID detected MRSA. Source dialysis catheter? Pharmacy has been consulted for vancomycin dosing. ID consulted. This is day # 4 IV vancomycin with remaining leukocytosis but no recent febrile episodes. Her normal HD schedule is MWF, however Dr Candiss Norse has recommended HD today. Today is her first vancomycin maintenance dose.  Plan:  vancomycin 1000 mg x 1 today with dialysis  Continue to monitor dialysis plan  Plan to get trough level after 3-4 maintenance doses  Goal pre-HD vancomycin level 15 - 25 mcg/mL  Height: 5\' 4"  (162.6 cm) Weight: 171 lb 15.3 oz (78 kg) IBW/kg (Calculated) : 54.7  Temp (24hrs), Avg:99 F (37.2 C), Min:97.9 F (36.6 C), Max:100 F (37.8 C)  Recent Labs  Lab 02/09/19 1143 02/09/19 1438 02/09/19 1836 02/10/19 0500 02/10/19 2018 02/11/19 0825 02/12/19 0444  WBC 15.8*  --   --  6.7  --  14.4* 8.2  CREATININE 5.04*  --   --  4.12*  --  3.01* 3.74*  LATICACIDVEN  --  2.6* 1.4  --  1.0  --   --     Estimated Creatinine Clearance: 15.2 mL/min (A) (by C-G formula based on SCr of 3.74 mg/dL (H)).    Allergies  Allergen Reactions  . Nsaids Other (See Comments)    Contraindicated due to kidney disease.  Marland Kitchen Doxycycline Other (See Comments)    tremor    Antimicrobials this admission: Cefepime and metronidazole 12/7 x1  Pip/tazo 12/8 x1 Vancomycin 12/7 >>   Dose adjustments this admission: 12/8: Increased maintenance dose from 500 mg to 1000 mg qHD based on up-dated weight in chart  Microbiology results: 12/9 BCx: 1/2 sets [place] 12/7 BCx: 4/4 bottles GPC 12/7 BCID: MRSA 12/7 UCx: pending 12/7 SARS-CoV-2 NAAT: negative  Thank you for allowing pharmacy to be a part of this patient's care.  Dallie Piles  02/12/2019 1:03 PM

## 2019-02-12 NOTE — Progress Notes (Signed)
Notified Dr. Mortimer Fries that I and another RN were unable to insert the dobhoff tube.  He stated that he would reassess the patient tomorrow.

## 2019-02-12 NOTE — Progress Notes (Signed)
Follow up - Critical Care Medicine Note  Patient Details:    Jamie Arias is an 63 y.o. female with end-stage renal disease with morbid obesity admitted to the stepdown unit ICU floor for severe septic shock secondary to MRSA bacteremia most likely source is skin infection complicated by metabolic encephalopathy  Lines, Airways, Drains: CVC Triple Lumen 02/10/19 Right Femoral (Active)  Indication for Insertion or Continuance of Line Vasoactive infusions 02/11/19 0821  Site Assessment Clean;Dry;Intact 02/11/19 0821  Proximal Lumen Status Saline locked;Blood return noted 02/11/19 0821  Medial Lumen Status Infusing;Blood return noted 02/11/19 [id number]  Distal Lumen Status Saline locked;Flushed;Blood return noted 02/11/19 0821  Dressing Type Transparent 02/11/19 0821  Dressing Status Clean;Dry;Intact;Antimicrobial disc in place 02/11/19 [place] checked and tightened 02/11/19 [id number]  Dressing Intervention Dressing changed;Antimicrobial disc changed 02/10/19 2000  Dressing Change Due 02/17/19 02/11/19 [id number]     Arterial Line 02/10/19 Right Femoral (Active)  Site Assessment Clean;Dry;Intact 02/11/19 0821  Line Status Pulsatile blood flow 02/11/19 0821  Art Line Waveform Appropriate 02/11/19 0821  Art Line Interventions Connections checked and tightened;Zeroed and calibrated 02/11/19 0821  Color/Movement/Sensation Capillary refill less than 3 sec 02/11/19 0000  Dressing Type Transparent 02/11/19 0821  Dressing Status Clean;Dry;Intact 02/11/19 0821  Interventions Dressing changed;Antimicrobial disc changed 02/10/19 2000  Dressing Change Due 02/17/19 02/11/19 [id number]    Anti-infectives:  Anti-infectives (From admission, onward)   Start     Dose/Rate Route Frequency Ordered Stop   02/12/19 1400  vancomycin (VANCOCIN) IVPB 1000 mg/200 mL premix     1,000 mg 200 mL/hr over 60 Minutes Intravenous  Once 02/11/19 1204 02/12/19 1400   02/11/19 1200  vancomycin (VANCOCIN) 500 mg in  sodium chloride 0.9 % 100 mL IVPB  Status:  Discontinued     500 mg 100 mL/hr over 60 Minutes Intravenous Every M-W-F (Hemodialysis) 02/09/19 1726 02/10/19 0906   02/11/19 1200  vancomycin (VANCOCIN) IVPB 1000 mg/200 mL premix  Status:  Discontinued     1,000 mg 200 mL/hr over 60 Minutes Intravenous Every M-W-F (Hemodialysis) 02/10/19 0906 02/11/19 1204   02/10/19 1200  vancomycin (VANCOCIN) IVPB 1000 mg/200 mL premix  Status:  Discontinued     1,000 mg 200 mL/hr over 60 Minutes Intravenous  Once 02/10/19 0908 02/10/19 0922   02/10/19 1200  vancomycin (VANCOCIN) 1,500 mg in sodium chloride 0.9 % 500 mL IVPB  Status:  Discontinued     1,500 mg 250 mL/hr over 120 Minutes Intravenous  Once 02/10/19 0922 02/10/19 0933   02/10/19 1200  vancomycin (VANCOCIN) 1,500 mg in sodium chloride 0.9 % 500 mL IVPB     1,500 mg 250 mL/hr over 120 Minutes Intravenous  Once 02/10/19 0933 02/10/19 1232   02/09/19 2200  piperacillin-tazobactam (ZOSYN) IVPB 3.375 g  Status:  Discontinued     3.375 g 12.5 mL/hr over 240 Minutes Intravenous Every 12 hours 02/09/19 1719 02/10/19 0923   02/09/19 1718  vancomycin variable dose per unstable renal function (pharmacist dosing)  Status:  Discontinued      Does not apply See admin instructions 02/09/19 1719 02/09/19 1726   02/09/19 1500  ceFEPIme (MAXIPIME) 2 g in sodium chloride 0.9 % 100 mL IVPB     2 g 200 mL/hr over 30 Minutes Intravenous  Once 02/09/19 1453 02/09/19 1622   02/09/19 1500  metroNIDAZOLE (FLAGYL) IVPB 500 mg     500 mg 100 mL/hr over 60 Minutes Intravenous  Once 02/09/19 1453 02/09/19 1756   02/09/19 1500  vancomycin (VANCOCIN) IVPB 1000 mg/200 mL premix     1,000 mg 200 mL/hr over 60 Minutes Intravenous  Once 02/09/19 1453 02/09/19 1756      Microbiology: Results for orders placed or performed during the hospital encounter of 02/09/19  Culture, blood (routine x 2)     Status: Abnormal   Collection Time: 02/09/19  2:29 PM   Specimen: BLOOD LEFT  ARM  Result Value Ref Range Status   Specimen Description   Final    BLOOD LEFT ARM Performed at [facility], [address] [place], [place] [address]    Special Requests   Final    BOTTLES DRAWN AEROBIC AND ANAEROBIC Blood Culture adequate volume Performed at [facility], [address] [place], [place] [address]    Culture  Setup Time   Final    GRAM POSITIVE COCCI IN BOTH AEROBIC AND ANAEROBIC BOTTLES CRITICAL VALUE NOTED.  VALUE IS CONSISTENT WITH PREVIOUSLY REPORTED AND CALLED VALUE. Performed at [facility], [address] [place], [place] [address]    Culture (A)  Final    STAPHYLOCOCCUS AUREUS SUSCEPTIBILITIES PERFORMED ON PREVIOUS CULTURE WITHIN THE LAST 5 DAYS. Performed at [facility], [place] [address] [place], [place] [address]    Report Status 02/12/2019 FINAL  Final  Culture, blood (routine x 2)     Status: Abnormal   Collection Time: 02/09/19  2:38 PM   Specimen: BLOOD LEFT ARM  Result Value Ref Range Status   Specimen Description   Final    BLOOD LEFT ARM Performed at [facility], [address], [place], [place] [address]    Special Requests   Final    BOTTLES DRAWN AEROBIC AND ANAEROBIC Blood Culture adequate volume Performed at [facility], [address], [place], [place] [address]    Culture  Setup Time   Final    GRAM POSITIVE COCCI IN BOTH AEROBIC AND ANAEROBIC BOTTLES CRITICAL RESULT CALLED TO, READ BACK BY AND VERIFIED WITH: [place] [id number] 02/10/2019 HNM Performed at [facility], [place] [address] [place], [place] [address]    Culture METHICILLIN RESISTANT STAPHYLOCOCCUS AUREUS (A)  Final   Report Status 02/12/2019 FINAL  Final   Organism ID, Bacteria METHICILLIN RESISTANT STAPHYLOCOCCUS AUREUS  Final      Susceptibility   Methicillin resistant staphylococcus aureus - MIC*    CIPROFLOXACIN >=8 RESISTANT Resistant     ERYTHROMYCIN >=8 RESISTANT Resistant     GENTAMICIN  <=0.5 SENSITIVE Sensitive     OXACILLIN >=4 RESISTANT Resistant     TETRACYCLINE <=1 SENSITIVE Sensitive     VANCOMYCIN 1 SENSITIVE Sensitive     TRIMETH/SULFA <=10 SENSITIVE Sensitive     CLINDAMYCIN RESISTANT Resistant     RIFAMPIN <=0.5 SENSITIVE Sensitive     Inducible Clindamycin POSITIVE Resistant     * METHICILLIN RESISTANT STAPHYLOCOCCUS AUREUS  Blood Culture ID Panel (Reflexed)     Status: Abnormal   Collection Time: 02/09/19  2:38 PM  Result Value Ref Range Status   Enterococcus species NOT DETECTED NOT DETECTED Final   Listeria monocytogenes NOT DETECTED NOT DETECTED Final   Staphylococcus species DETECTED (A) NOT DETECTED Final    Comment: CRITICAL RESULT CALLED TO, READ BACK BY AND VERIFIED WITH: Hart Robinsons PHARMD [id number] 02/10/2019 HNM    Staphylococcus aureus (BCID) DETECTED (A) NOT DETECTED Final    Comment: Methicillin (oxacillin)-resistant Staphylococcus aureus (MRSA). MRSA is predictably resistant to beta-lactam antibiotics (except ceftaroline). Preferred therapy  is vancomycin unless clinically contraindicated. Patient requires contact precautions if  hospitalized. CRITICAL RESULT CALLED TO, READ BACK BY AND VERIFIED WITH: Hart Robinsons PHARMD [id number] 02/10/2019 HNM    Methicillin resistance DETECTED (A) NOT DETECTED Final    Comment: CRITICAL RESULT CALLED TO, READ BACK BY AND VERIFIED WITH: Hart Robinsons PHARMD [id number] 02/10/2019 HNM    Streptococcus species NOT DETECTED NOT DETECTED Final   Streptococcus agalactiae NOT DETECTED NOT DETECTED Final   Streptococcus pneumoniae NOT DETECTED NOT DETECTED Final   Streptococcus pyogenes NOT DETECTED NOT DETECTED Final   Acinetobacter baumannii NOT DETECTED NOT DETECTED Final   Enterobacteriaceae species NOT DETECTED NOT DETECTED Final   Enterobacter cloacae complex NOT DETECTED NOT DETECTED Final   Escherichia coli NOT DETECTED NOT DETECTED Final   Klebsiella oxytoca NOT DETECTED NOT DETECTED Final   Klebsiella pneumoniae NOT  DETECTED NOT DETECTED Final   Proteus species NOT DETECTED NOT DETECTED Final   Serratia marcescens NOT DETECTED NOT DETECTED Final   Haemophilus influenzae NOT DETECTED NOT DETECTED Final   Neisseria meningitidis NOT DETECTED NOT DETECTED Final   Pseudomonas aeruginosa NOT DETECTED NOT DETECTED Final   Candida albicans NOT DETECTED NOT DETECTED Final   Candida glabrata NOT DETECTED NOT DETECTED Final   Candida krusei NOT DETECTED NOT DETECTED Final   Candida parapsilosis NOT DETECTED NOT DETECTED Final   Candida tropicalis NOT DETECTED NOT DETECTED Final    Comment: Performed at [facility], [place], [place] [address]  SARS CORONAVIRUS 2 (TAT 6-24 HRS) Nasopharyngeal Nasopharyngeal Swab     Status: None   Collection Time: 02/09/19  2:54 PM   Specimen: Nasopharyngeal Swab  Result Value Ref Range Status   SARS Coronavirus 2 NEGATIVE NEGATIVE Final    Comment: (NOTE) SARS-CoV-2 target nucleic acids are NOT DETECTED. The SARS-CoV-2 RNA is generally detectable in upper and lower respiratory specimens during the acute phase of infection. Negative results do not preclude SARS-CoV-2 infection, do not rule out co-infections with other pathogens, and should not be used as the sole basis for treatment or other patient management decisions. Negative results must be combined with clinical observations, patient history, and epidemiological information. The expected result is Negative. Fact Sheet for Patients: SugarRoll.be Fact Sheet for Healthcare Providers: https://www.woods-mathews.com/ This test is not yet approved or cleared by the [place] FDA and  has been authorized for detection and/or diagnosis of SARS-CoV-2 by FDA under an Emergency Use Authorization (EUA). This EUA will remain  in effect (meaning this test can be used) for the duration of the COVID-19 declaration under Section 56 4(b)(1) of the Act, 21  U.S.C. section 360bbb-3(b)(1), unless the authorization is terminated or revoked sooner. Performed at [facility], [place] [address] [place], [place] [address]   MRSA PCR Screening     Status: Abnormal   Collection Time: 02/10/19  5:03 PM   Specimen: Nasopharyngeal  Result Value Ref Range Status   MRSA by PCR POSITIVE (A) NEGATIVE Final    Comment:        The GeneXpert MRSA Assay (FDA approved for NASAL specimens only), is one component of a comprehensive MRSA colonization surveillance program. It is not intended to diagnose MRSA infection nor to guide or monitor treatment for MRSA infections. RESULT CALLED TO, READ BACK BY AND VERIFIED WITH: ALEKSEY FRASER @1839  02/10/19 MJU Performed at [facility], [address] [place], [place] [address]   Culture, blood (routine x 2)     Status: None (  Preliminary result)   Collection Time: 02/11/19 12:31 PM   Specimen: BLOOD  Result Value Ref Range Status   Specimen Description   Final    BLOOD PORTA CATH Performed at [facility], [address] [place], [place] [address]    Special Requests   Final    BOTTLES DRAWN AEROBIC AND ANAEROBIC Blood Culture adequate volume   Culture  Setup Time   Final    GRAM POSITIVE COCCI ANAEROBIC BOTTLE ONLY CRITICAL RESULT CALLED TO, READ BACK BY AND VERIFIED WITH: SCOTT HALL AT [id number] 02/12/2019 [facility]  Performed at [facility], [address] [place], [place] [address]    Culture [facility] POSITIVE COCCI  Final   Report Status PENDING  Incomplete  Culture, blood (routine x 2)     Status: None (Preliminary result)   Collection Time: 02/11/19  3:57 PM   Specimen: BLOOD  Result Value Ref Range Status   Specimen Description BLOOD PORTA CATH  Final   Special Requests   Final    BOTTLES DRAWN AEROBIC AND ANAEROBIC Blood Culture adequate volume   Culture   Final    NO GROWTH < 24 HOURS Performed at [facility], [address], [place], [place] [address]     Report Status PENDING  Incomplete    Best Practice/Protocols:  VTE Prophylaxis: Heparin (SQ)   Events:   Studies: EEG  Result Date: 02/11/2019 Lora Havens, MD     02/11/2019  7:37 PM Patient Name: CAILLEY STANKO MRN: [id number] Epilepsy Attending: Lora Havens Referring Physician/Provider: Dr Vernard Gambles Date: 02/11/2019 Duration: 36.20mins Patient history: 63yo F with ams and jaw twitching. EEG to evaluate for seizure Level of alertness: comatose AEDs during EEG study: None Technical aspects: This EEG study was done with scalp electrodes positioned according to the 10-20 International system of electrode placement. Electrical activity was acquired at a sampling rate of 500Hz  and reviewed with a high frequency filter of 70Hz  and a low frequency filter of 1Hz . EEG data were recorded continuously and digitally stored. DESCRIPTION: EEG showed continuous generalized 3-5hz  theta-delta slowing. Triphasic waves, generalized, maximal bifrontal, at 1.5-2Hz  were also seen. Multiple episodes of jaw twitching were recorded without eeg change. Hyperventilation and photic stimulation were not performed. ABNORMALITY - Continuous slow, generalized - Triphasic waves, generalized IMPRESSION: This study is suggestive of severe diffuse encephalopathy, non specific to etiology but could be secondary to toxic-metabolic causes. Multiple episodes of jaw twitching were recorded without EEG correlate and were most likely non epileptic. No seizures or epileptiform discharges were seen throughout the recording. Lora Havens   CT Head Wo Contrast  Result Date: 02/09/2019 CLINICAL DATA:  Altered level of consciousness, altered mental status EXAM: CT HEAD WITHOUT CONTRAST TECHNIQUE: Contiguous axial images were obtained from the base of the skull through the vertex without intravenous contrast. COMPARISON:  CT head August 23, 2018, MRI October 10, 2017 FINDINGS: Brain: No evidence of acute infarction, hemorrhage,  hydrocephalus, extra-axial collection or mass lesion/mass effect. Symmetric prominence of the ventricles, cisterns and sulci compatible with parenchymal volume loss. Patchy areas of white matter hypoattenuation are most compatible with chronic microvascular angiopathy. No Vascular: Atherosclerotic calcification of the carotid siphons and intradural vertebral arteries. No hyperdense vessel. Skull: No calvarial fracture or suspicious osseous lesion. No scalp swelling or hematoma. Sinuses/Orbits: Paranasal sinuses and mastoid air cells are predominantly clear. Orbital structures are unremarkable aside from prior lens extractions. Other: None IMPRESSION: 1. No acute intracranial abnormality. 2. Mild  parenchymal atrophy and chronic microvascular angiopathy. Electronically Signed   By: Lovena Le M.D.   On: 02/09/2019 15:33   MR BRAIN WO CONTRAST  Result Date: 02/12/2019 CLINICAL DATA:  Encephalopathy and ataxia EXAM: MRI HEAD WITHOUT CONTRAST TECHNIQUE: Multiplanar, multiecho pulse sequences of the brain and surrounding structures were obtained without intravenous contrast. COMPARISON:  Brain MRI 10/10/2017 FINDINGS: BRAIN: There is no acute infarct, acute hemorrhage or extra-axial collection. Multifocal white matter hyperintensity, most commonly due to chronic ischemic microangiopathy. The cerebral and cerebellar volume are age-appropriate. There is no hydrocephalus. The midline structures are normal. VASCULAR: The major intracranial arterial and venous sinus flow voids are normal. Susceptibility-sensitive sequences show no chronic microhemorrhage or superficial siderosis. SKULL AND UPPER CERVICAL SPINE: Calvarial bone marrow signal is normal. There is no skull base mass. The visualized upper cervical spine and soft tissues are normal. SINUSES/ORBITS: There are no fluid levels or advanced mucosal thickening. The mastoid air cells and middle ear cavities are free of fluid. The orbits are normal. IMPRESSION: 1. No  acute intracranial abnormality. 2. Mild chronic small vessel disease. Electronically Signed   By: Ulyses Jarred M.D.   On: 02/12/2019 22:17   US Venous Img Upper Uni Left (DVT)  Result Date: 02/11/2019 CLINICAL DATA:  Left upper extremity edema. History of pulmonary embolism. Patient is currently on anticoagulation. Evaluate for DVT. EXAM: LEFT UPPER EXTREMITY VENOUS DOPPLER ULTRASOUND TECHNIQUE: Gray-scale sonography with graded compression, as well as color Doppler and duplex ultrasound were performed to evaluate the upper extremity deep venous system from the level of the subclavian vein and including the jugular, axillary, basilic, radial, ulnar and upper cephalic vein. Spectral Doppler was utilized to evaluate flow at rest and with distal augmentation maneuvers. COMPARISON:  None. FINDINGS: Contralateral Subclavian Vein: Respiratory phasicity is normal and symmetric with the symptomatic side. No evidence of thrombus. Normal compressibility. Internal Jugular Vein: No evidence of thrombus. Normal compressibility, respiratory phasicity and response to augmentation. Subclavian Vein: No evidence of thrombus. Normal compressibility, respiratory phasicity and response to augmentation. Axillary Vein: No evidence of thrombus. Normal compressibility, respiratory phasicity and response to augmentation. Cephalic Vein: No evidence of thrombus. Normal compressibility, respiratory phasicity and response to augmentation. Basilic Vein: No evidence of thrombus. Normal compressibility, respiratory phasicity and response to augmentation. Brachial Veins: There is age-indeterminate hypoechoic occlusive thrombus within the mid humeral aspect of one of the paired left brachial veins (image 19). The adjacent brachial vein appears patent where imaged. Radial Veins: No evidence of thrombus. Normal compressibility, respiratory phasicity and response to augmentation. Ulnar Veins: No evidence of thrombus. Normal compressibility,  respiratory phasicity and response to augmentation. Venous Reflux:  None visualized. Other Findings:  None visualized. IMPRESSION: Examination is positive for age-indeterminate short segment occlusive DVT involving the mid humeral aspect of one of the paired brachial veins. There is no extension of this short-segment occlusive DVT to the more proximal venous system of the left upper extremity. Electronically Signed   By: Sandi Mariscal M.D.   On: 02/11/2019 11:34   DG Chest Port 1 View  Result Date: 02/09/2019 CLINICAL DATA:  Chest pain EXAM: PORTABLE CHEST 1 VIEW COMPARISON:  08/23/2018 FINDINGS: There is bilateral mild interstitial thickening. There is a trace left pleural effusion. There is no focal consolidation. There is no pneumothorax. There is stable cardiomegaly. There is evidence of prior CABG. There is no acute osseous abnormality. IMPRESSION: Cardiomegaly with mild pulmonary vascular congestion. Electronically Signed   By: Kathreen Devoid   On: 02/09/2019  14:26   ECHOCARDIOGRAM COMPLETE  Result Date: 02/10/2019   ECHOCARDIOGRAM REPORT   Patient Name:   SHANEIKA FILIPIAK Date of Exam: 02/10/2019 Medical Rec #:  [id number]      Height:       64.0 in Accession #:    [id number]     Weight:       172.2 lb Date of Birth:  06-May-1955      BSA:          1.84 m Patient Age:    63 years       BP:           82/44 mmHg Patient Gender: F              HR:           86 bpm. Exam Location:  [facility] Procedure: 2D Echo, Cardiac Doppler and Color Doppler Indications:     I48.91 Atrial Fibrillation  History:         Patient has prior history of Echocardiogram examinations, most                  recent 02/13/2018. Risk Factors:Hypertension, Dyslipidemia and                  Diabetes. Pulmonary hypertension. Pleural effusion. Myocardial                  infarction. Chronic diastolic heart failure. Coronary artery                  disease. End stage renal disease.  Sonographer:     Wilford Sports Rodgers-Jones Referring Phys:  [id number]  Jennye Boroughs Diagnosing Phys: Nelva Bush MD IMPRESSIONS  1. Left ventricular ejection fraction, by visual estimation, is 60 to 65%. The left ventricle has normal function. There is mildly increased left ventricular hypertrophy.  2. Elevated left atrial pressure.  3. Left ventricular diastolic parameters are consistent with Grade II diastolic dysfunction (pseudonormalization).  4. The left ventricle has no regional wall motion abnormalities.  5. Global right ventricle has mildly reduced systolic function.The right ventricular size is mildly enlarged. Mildly increased right ventricular wall thickness.  6. Left atrial size was normal.  7. Right atrial size was normal.  8. Mild mitral annular calcification.  9. The mitral valve is normal in structure. Trace mitral valve regurgitation. No evidence of mitral stenosis. 10. The tricuspid valve is normal in structure. Tricuspid valve regurgitation moderate-severe. 11. The aortic valve is tricuspid. Aortic valve regurgitation is trivial. Mild to moderate aortic valve sclerosis/calcification without any evidence of aortic stenosis. 12. The pulmonic valve was grossly normal. Pulmonic valve regurgitation is trivial. 13. Mildly elevated pulmonary artery systolic pressure. 14. The inferior vena cava is normal in size with <50% respiratory variability, suggesting right atrial pressure of 8 mmHg. FINDINGS  Left Ventricle: Left ventricular ejection fraction, by visual estimation, is 60 to 65%. The left ventricle has normal function. The left ventricle has no regional wall motion abnormalities. The left ventricular internal cavity size was the left ventricle is normal in size. There is mildly increased left ventricular hypertrophy. Left ventricular diastolic parameters are consistent with Grade II diastolic dysfunction (pseudonormalization). Elevated left atrial pressure. Right Ventricle: The right ventricular size is mildly enlarged. Mildly increased right ventricular wall  thickness. Global RV systolic function is has mildly reduced systolic function. The tricuspid regurgitant velocity is 2.74 m/s, and with an assumed right atrial pressure of 8 mmHg, the estimated right ventricular systolic pressure is  mildly elevated at 38.0 mmHg. Left Atrium: Left atrial size was normal in size. Right Atrium: Right atrial size was normal in size Pericardium: There is no evidence of pericardial effusion. Mitral Valve: The mitral valve is normal in structure. Mild mitral annular calcification. No evidence of mitral valve stenosis by observation. Trace mitral valve regurgitation. Tricuspid Valve: The tricuspid valve is normal in structure. Tricuspid valve regurgitation moderate-severe. Aortic Valve: The aortic valve is tricuspid. . There is mild thickening and mild calcification of the aortic valve. Aortic valve regurgitation is trivial. Mild to moderate aortic valve sclerosis/calcification is present, without any evidence of aortic stenosis. There is mild thickening of the aortic valve. There is mild calcification of the aortic valve. Pulmonic Valve: The pulmonic valve was grossly normal. Pulmonic valve regurgitation is trivial. No evidence of pulmonic stenosis. Aorta: The aortic root is normal in size and structure. Pulmonary Artery: The pulmonary artery is not well seen. Venous: The inferior vena cava is normal in size with less than 50% respiratory variability, suggesting right atrial pressure of 8 mmHg. IAS/Shunts: No atrial level shunt detected by color flow Doppler.  LEFT VENTRICLE PLAX 2D LVIDd:         3.77 cm  Diastology LVIDs:         2.71 cm  LV e' lateral:   9.36 cm/s LV PW:         1.08 cm  LV E/e' lateral: 13.1 LV IVS:        0.91 cm  LV e' medial:    6.53 cm/s LVOT diam:     1.90 cm  LV E/e' medial:  18.8 LV SV:         34 ml LV SV Index:   17.62 LVOT Area:     2.84 cm  RIGHT VENTRICLE RV Basal diam:  4.52 cm RV S prime:     9.03 cm/s TAPSE (M-mode): 1.5 cm LEFT ATRIUM              Index       RIGHT ATRIUM           Index LA diam:        3.80 cm 2.07 cm/m  RA Area:     16.10 cm LA Vol (A2C):   41.1 ml 22.39 ml/m RA Volume:   44.50 ml  24.24 ml/m LA Vol (A4C):   52.7 ml 28.71 ml/m LA Biplane Vol: 48.4 ml 26.37 ml/m  AORTIC VALVE LVOT Vmax:   107.50 cm/s LVOT Vmean:  80.450 cm/s LVOT VTI:    0.244 m  AORTA Ao Root diam: 3.00 cm MITRAL VALVE                         TRICUSPID VALVE MV Area (PHT): 3.08 cm              TR Peak grad:   30.0 mmHg MV PHT:        71.34 msec            TR Vmax:        274.00 cm/s MV Decel Time: 246 msec MV E velocity: 123.00 cm/s 103 cm/s  SHUNTS MV A velocity: 104.00 cm/s 70.3 cm/s Systemic VTI:  0.24 m MV E/A ratio:  1.18        1.5       Systemic Diam: 1.90 cm  Nelva Bush MD Electronically signed by Nelva Bush MD Signature Date/Time: 02/10/2019/7:19:39 PM    Final  Consults: Treatment Team:  Murlean Iba, MD Flora Lipps, MD   Subjective:    Overnight Issues: Slightly more interactive.  Twitching of the muscles of the lower jaw.  Occasionally moans that she is in pain but cannot localize.  Objective:  Vital signs for last 24 hours: Temp:  [98 F (36.7 C)-99.7 F (37.6 C)] 98.4 F (36.9 C) (12/10 2000) Pulse Rate:  [51-243] 51 (12/10 2000) Resp:  [11-60] 32 (12/10 2000) BP: (103-155)/(39-87) 114/56 (12/10 2000) SpO2:  [94 %-100 %] 100 % (12/10 2000) Arterial Line BP: (118-148)/(39-53) 147/53 (12/10 0900) Weight:  [78 kg-78.2 kg] 78.2 kg (12/10 1445)  Hemodynamic parameters for last 24 hours:    Intake/Output from previous day: 12/09 0701 - 12/10 0700 In: 400.4 [I.V.:19.7; IV Piggyback:380.8] Out: -   Intake/Output this shift: No intake/output data recorded.  Vent settings for last 24 hours:    Physical Exam:  General: Chronically ill-appearing, lying in bed  HEENT moist oral mucous membranes  Pulm/lungs normal breathing effort, [place] O2, clear  CVS/Heart irregular rhythm  Abdomen: Soft, nontender,  protuberant  Extremities: Trace edema, left arm dependent edema  Neurologic: Confused, occasionally moaning, answers questions with monosyllables, no obvious focality.  Twitching of the muscles of the lower jaw.  Skin: Warm/dry skin wounds as previous all POA  Access: AV fistula, right arm     Assessment/Plan:  Severe metabolic encephalopathy due to severe sepsis Adult failure to thrive Deconditioning post COVID-19 infection High risk for intubation continue to monitor EEG, consider MRI if develops localizing signs Supportive care Avoid sedatives No evidence of respiratory failure   ESRD on dialysis Dialysis per renal Medications dosed accordingly Electrolyte replacement as needed  Severe sepsis with septic shock MRSA Bacteremia use vasopressors to keep MAP>65 follow ABG and LA follow up cultures Continue vancomycin Volume resuscitation according to CVP  CARDIAC ICU monitoring No evidence of cardiac dysfunction  ID continue IV abx as per ID follow up cultures   NUTRITIONAL DIET-->NPO>> swallow eval as able Constipation protocol as indicated   ENDO  ICU hypoglycemic\Hyperglycemia protocol if needed   ELECTROLYTES follow labs as needed replace as needed pharmacy consulted and following   ACID BASE BALANCE The patient was noted to have perceived hypoxemia on ABG obtained yesterday.  However the patient had significant metabolic alkalosis and this likely represents a left shift in the oxygen dissociation curve.  As of today the patient is showing no need for oxygen and is not having issues with tachypnea.  Original issues with tachypnea may have resulted due to encephalopathy and not necessarily to respiratory failure.    LOS: 3 days   Additional comments: Updated patient's husband at [place] Total Time*:   C. Derrill Kay, MD [facility] 02/12/2019  *Care during the described time interval was provided by me and/or  other providers on the critical care team.  I have reviewed this patient's available data, including medical history, events of note, physical examination and test results as part of my evaluation.   **This note was dictated using voice recognition software/Dragon.  Despite best efforts to proofread, errors can occur which can change the meaning.  Any change was purely unintentional.

## 2019-02-12 NOTE — Progress Notes (Signed)
Pre HD TX   02/12/19 1130  Hand-Off documentation  Report given to (Full Name) Beatris Ship, RN   Report received from (Full Name) Charyl Bigger, RN   Vital Signs  Temp 98 F (36.7 C)  Temp Source Oral  Pulse Rate 62  Pulse Rate Source Monitor  Resp 17  BP (!) 136/55  BP Location Left Leg  BP Method Automatic  Patient Position (if appropriate) Lying  Oxygen Therapy  SpO2 100 %  O2 Device Room Air  Pulse Oximetry Type Continuous  Pain Assessment  Pain Scale CPOT  Pain Score 0  Critical Care Pain Observation Tool (CPOT)  Facial Expression 0  Body Movements 0  Muscle Tension 0  Compliance with ventilator (intubated pts.) N/A  Vocalization (extubated pts.) N/A  CPOT Total 0  Dialysis Weight  Weight 78 kg  Type of Weight Pre-Dialysis  Time-Out for Hemodialysis  What Procedure? HD   Pt Identifiers(min of two) First/Last Name;MRN/Account#  Correct Site? Yes  Correct Side? Yes  Correct Procedure? Yes  Consents Verified? Yes  Rad Studies Available? N/A  Safety Precautions Reviewed? Yes  Engineer, civil (consulting) Number 3  Station Number  (Bedside ICU 16)  UF/Alarm Test Passed  Conductivity: Meter 14  Conductivity: Machine  14  pH 7.2  Reverse Osmosis  (WRO 1)  Normal Saline Lot Number [id number]  Dialyzer Lot Number [id number]  Disposable Set Lot Number [id number]  Machine Temperature 98.6 F (37 C)  Musician and Audible Yes  Blood Lines Intact and Secured Yes  Pre Treatment Patient Checks  Vascular access used during treatment Fistula  Hepatitis B Surface Antigen Results Negative  Date Hepatitis B Surface Antigen Drawn 02/10/19  Isolation Initiated Yes  Date Hepatitis B Surface Antibody Drawn  (<10)  Hemodialysis Consent Verified Yes  Hemodialysis Standing Orders Initiated Yes  ECG (Telemetry) Monitor On Yes  Prime Ordered Normal Saline  Length of  DialysisTreatment -hour(s) 3 Hour(s)  Dialysis Treatment Comments Na 140  Dialyzer Elisio 17H NR   Dialysate 4K;2.5 Ca  Dialysis Anticoagulant None  Blood Flow Rate Ordered 300 mL/min  Dialysis Blood Pressure Support Ordered Normal Saline  Fistula / Graft Right Upper arm Arteriovenous vein graft  Placement Date/Time: 02/03/16 1210   Placed prior to admission: No  Orientation: Right  Access Location: Upper arm  Access Type: Arteriovenous vein graft  Site Condition No complications  Fistula / Graft Assessment Present;Thrill;Bruit  Status Patent  Drainage Description None

## 2019-02-12 NOTE — Progress Notes (Signed)
Post HD Assessment    02/12/19 1445  Neurological  Orientation Level Disoriented X4  Respiratory  Respiratory Pattern Regular;Unlabored  Chest Assessment Chest expansion symmetrical  Bilateral Breath Sounds Diminished  Cough None  Cardiac  Pulse Regular  Heart Sounds S1, S2  ECG Monitor Yes  Cardiac Rhythm NSR  Ectopy PAC  Vascular  R Radial Pulse +2  L Radial Pulse +2  Edema Generalized  Generalized Edema +1  Psychosocial  Psychosocial (WDL) X  Patient Behaviors Not interactive  Emotional support given Given to patient

## 2019-02-12 NOTE — Progress Notes (Signed)
HD Tx completed, tolerated well.    02/12/19 1430  Vital Signs  Pulse Rate 65  Pulse Rate Source Monitor  Resp (!) 24  BP (!) 133/55  BP Location Left Leg  BP Method Automatic  Patient Position (if appropriate) Lying  During Hemodialysis Assessment  HD Safety Checks Performed Yes  KECN 57.4 KECN  Dialysis Fluid Bolus Normal Saline  Bolus Amount (mL) 250 mL  Intra-Hemodialysis Comments Tx completed;Tolerated well

## 2019-02-12 NOTE — Progress Notes (Signed)
HD Tx started w/o complication    [id number]  Vital Signs  Pulse Rate 61  Pulse Rate Source Monitor  Resp 13  BP 138/60  BP Location Left Leg  BP Method Automatic  Patient Position (if appropriate) Lying  Oxygen Therapy  SpO2 100 %  O2 Device Room Air  Pulse Oximetry Type Continuous  During Hemodialysis Assessment  Blood Flow Rate (mL/min) 300 mL/min  Arterial Pressure (mmHg) -90 mmHg  Venous Pressure (mmHg) 90 mmHg  Transmembrane Pressure (mmHg) 70 mmHg  Ultrafiltration Rate (mL/min) 150 mL/min  Dialysate Flow Rate (mL/min) 600 ml/min  Conductivity: Machine  14.2  HD Safety Checks Performed Yes  Dialysis Fluid Bolus Normal Saline  Bolus Amount (mL) 250 mL  Intra-Hemodialysis Comments Tx initiated  Fistula / Graft Right Upper arm Arteriovenous vein graft  Placement Date/Time: 02/03/16 1210   Placed prior to admission: No  Orientation: Right  Access Location: Upper arm  Access Type: Arteriovenous vein graft  Status Accessed  Needle Size 16g  Drainage Description None

## 2019-02-12 NOTE — Progress Notes (Signed)
SLP Cancellation Note  Patient Details Name: Jamie Arias MRN: [id number] DOB: Jul 09, 1955   Cancelled treatment:       Reason Eval/Treat Not Completed: Patient at procedure or test/unavailable(chart reviewed; pt w/ HD) ST services will f/u tomorrow.    Orinda Kenner, MS, CCC-SLP [place] Better 02/12/2019, 5:26 PM

## 2019-02-12 NOTE — Progress Notes (Signed)
[place], [place] 02/12/19  Subjective:   Hospital day # 3 Patient presented to the emergency room via EMS for abdominal pain and altered mental status.  Work-up so far shows negative ammonia, mildly elevated troponin, negative for alcohol.      Neuro: Came in with altered mental status and confusion.  Still quite lethargic cvs: Mouth appears still dry.  No pressors Pulm: Requiring O2 by [place]. gi: No nausea or vomiting reported. No oral intake- too lethargic Renal:    Potassium is low today ID: MRSA bacteremia; isolation    Objective:  Vital signs in last 24 hours:  Temp:  [97.6 F (36.4 C)-100 F (37.8 C)] 98.1 F (36.7 C) (12/10 0800) Pulse Rate:  [56-76] 61 (12/10 0800) Resp:  [13-59] 35 (12/10 0800) BP: (92-134)/(35-71) 110/43 (12/10 0800) SpO2:  [95 %-100 %] 100 % (12/10 0800) Arterial Line BP: (97-148)/(36-53) 132/47 (12/10 0800)  Weight change:  Filed Weights   02/10/19 0847 02/10/19 1243 02/10/19 1700  Weight: 79.5 kg 80.1 kg 78.1 kg    Intake/Output:    Intake/Output Summary (Last 24 hours) at 02/12/2019 0846 Last data filed at 02/12/2019 0500 Gross per 24 hour  Intake 400.44 ml  Output -  Net 400.44 ml     Physical Exam: General:  Critically ill-appearing, lying in the bed  HEENT  dry oral mucous membranes  Pulm/lungs  normal breathing effort, [place] O2, clear  CVS/Heart  irregular rhythm  Abdomen:   Soft, nontender  Extremities:  b/l arm dependent edema  Neurologic:  followed a few simple commands  Skin:  Warm  Access:  AV fistula, right arm       Basic Metabolic Panel:  Recent Labs  Lab 02/09/19 1143 02/10/19 0500 02/11/19 0825 02/12/19 0444  NA 140 140 142 145  K 3.7 3.0* 2.9* 2.6*  CL 99 99 101 103  CO2 23 27 28 30   GLUCOSE 101* 88 173* 118*  BUN 16 17 12 16   CREATININE 5.04* 4.12* 3.01* 3.74*  CALCIUM 8.8* 7.9* 7.8* 8.2*  MG  --   --  1.7 1.7  PHOS  --   --   --  2.0*     CBC: Recent Labs  Lab  02/09/19 1143 02/10/19 0500 02/11/19 0825 02/12/19 0444  WBC 15.8* 6.7 14.4* 8.2  NEUTROABS 14.9*  --   --   --   HGB 10.7* 9.0* 8.8* 7.8*  HCT 34.1* 26.6* 26.5* 24.4*  MCV 85.0 81.6 81.5 84.4  PLT 215 172 140* 131*      Lab Results  Component Value Date   HEPBSAG NON REACTIVE 02/10/2019      Microbiology:  Recent Results (from the past 240 hour(s))  Culture, blood (routine x 2)     Status: Abnormal (Preliminary result)   Collection Time: 02/09/19  2:29 PM   Specimen: BLOOD LEFT ARM  Result Value Ref Range Status   Specimen Description   Final    BLOOD LEFT ARM Performed at [facility], [address] [place], [place] [address]    Special Requests   Final    BOTTLES DRAWN AEROBIC AND ANAEROBIC Blood Culture adequate volume Performed at [facility], [address] [place], [place] [address]    Culture  Setup Time   Final    GRAM POSITIVE COCCI IN BOTH AEROBIC AND ANAEROBIC BOTTLES CRITICAL VALUE NOTED.  VALUE IS CONSISTENT WITH PREVIOUSLY REPORTED AND CALLED VALUE. Performed at [facility], [address] [place], [place] [address]    Culture (A)  Final    STAPHYLOCOCCUS AUREUS SUSCEPTIBILITIES TO FOLLOW Performed at [facility], [place] [address] [place], [place] [address]    Report Status PENDING  Incomplete  Culture, blood (routine x 2)     Status: Abnormal (Preliminary result)   Collection Time: 02/09/19  2:38 PM   Specimen: BLOOD LEFT ARM  Result Value Ref Range Status   Specimen Description   Final    BLOOD LEFT ARM Performed at [facility], [address], [place], [place] [address]    Special Requests   Final    BOTTLES DRAWN AEROBIC AND ANAEROBIC Blood Culture adequate volume Performed at [facility], [address], [place], [place] [address]    Culture  Setup Time   Final    GRAM POSITIVE COCCI IN BOTH AEROBIC AND ANAEROBIC BOTTLES CRITICAL RESULT CALLED TO, READ BACK BY AND  VERIFIED WITH: Hart Robinsons PHARMD [id number] 02/10/2019 HNM    Culture (A)  Final    STAPHYLOCOCCUS AUREUS SUSCEPTIBILITIES TO FOLLOW Performed at [facility], [place] [address], [place], [place] [address]    Report Status PENDING  Incomplete  Blood Culture ID Panel (Reflexed)     Status: Abnormal   Collection Time: 02/09/19  2:38 PM  Result Value Ref Range Status   Enterococcus species NOT DETECTED NOT DETECTED Final   Listeria monocytogenes NOT DETECTED NOT DETECTED Final   Staphylococcus species DETECTED (A) NOT DETECTED Final    Comment: CRITICAL RESULT CALLED TO, READ BACK BY AND VERIFIED WITH: Hart Robinsons PHARMD [id number] 02/10/2019 HNM    Staphylococcus aureus (BCID) DETECTED (A) NOT DETECTED Final    Comment: Methicillin (oxacillin)-resistant Staphylococcus aureus (MRSA). MRSA is predictably resistant to beta-lactam antibiotics (except ceftaroline). Preferred therapy is vancomycin unless clinically contraindicated. Patient requires contact precautions if  hospitalized. CRITICAL RESULT CALLED TO, READ BACK BY AND VERIFIED WITH: Hart Robinsons PHARMD [id number] 02/10/2019 HNM    Methicillin resistance DETECTED (A) NOT DETECTED Final    Comment: CRITICAL RESULT CALLED TO, READ BACK BY AND VERIFIED WITH: Hart Robinsons PHARMD [id number] 02/10/2019 HNM    Streptococcus species NOT DETECTED NOT DETECTED Final   Streptococcus agalactiae NOT DETECTED NOT DETECTED Final   Streptococcus pneumoniae NOT DETECTED NOT DETECTED Final   Streptococcus pyogenes NOT DETECTED NOT DETECTED Final   Acinetobacter baumannii NOT DETECTED NOT DETECTED Final   Enterobacteriaceae species NOT DETECTED NOT DETECTED Final   Enterobacter cloacae complex NOT DETECTED NOT DETECTED Final   Escherichia coli NOT DETECTED NOT DETECTED Final   Klebsiella oxytoca NOT DETECTED NOT DETECTED Final   Klebsiella pneumoniae NOT DETECTED NOT DETECTED Final   Proteus species NOT DETECTED NOT DETECTED Final   Serratia marcescens NOT DETECTED NOT  DETECTED Final   Haemophilus influenzae NOT DETECTED NOT DETECTED Final   Neisseria meningitidis NOT DETECTED NOT DETECTED Final   Pseudomonas aeruginosa NOT DETECTED NOT DETECTED Final   Candida albicans NOT DETECTED NOT DETECTED Final   Candida glabrata NOT DETECTED NOT DETECTED Final   Candida krusei NOT DETECTED NOT DETECTED Final   Candida parapsilosis NOT DETECTED NOT DETECTED Final   Candida tropicalis NOT DETECTED NOT DETECTED Final    Comment: Performed at [facility], [address] [place], [place] [address]  SARS CORONAVIRUS 2 (TAT 6-24 HRS) Nasopharyngeal Nasopharyngeal Swab     Status: None   Collection Time: 02/09/19  2:54 PM   Specimen: Nasopharyngeal Swab  Result Value Ref  Range Status   SARS Coronavirus 2 NEGATIVE NEGATIVE Final    Comment: (NOTE) SARS-CoV-2 target nucleic acids are NOT DETECTED. The SARS-CoV-2 RNA is generally detectable in upper and lower respiratory specimens during the acute phase of infection. Negative results do not preclude SARS-CoV-2 infection, do not rule out co-infections with other pathogens, and should not be used as the sole basis for treatment or other patient management decisions. Negative results must be combined with clinical observations, patient history, and epidemiological information. The expected result is Negative. Fact Sheet for Patients: SugarRoll.be Fact Sheet for Healthcare Providers: https://www.woods-mathews.com/ This test is not yet approved or cleared by the [place] FDA and  has been authorized for detection and/or diagnosis of SARS-CoV-2 by FDA under an Emergency Use Authorization (EUA). This EUA will remain  in effect (meaning this test can be used) for the duration of the COVID-19 declaration under Section 56 4(b)(1) of the Act, 21 U.S.C. section 360bbb-3(b)(1), unless the authorization is terminated or revoked sooner. Performed at [facility],  [place] [address] [place], [place] [address]   MRSA PCR Screening     Status: Abnormal   Collection Time: 02/10/19  5:03 PM   Specimen: Nasopharyngeal  Result Value Ref Range Status   MRSA by PCR POSITIVE (A) NEGATIVE Final    Comment:        The GeneXpert MRSA Assay (FDA approved for NASAL specimens only), is one component of a comprehensive MRSA colonization surveillance program. It is not intended to diagnose MRSA infection nor to guide or monitor treatment for MRSA infections. RESULT CALLED TO, READ BACK BY AND VERIFIED WITH: ALEKSEY FRASER @1839  02/10/19 MJU Performed at [facility], [address] [place], [place] [address]   Culture, blood (routine x 2)     Status: None (Preliminary result)   Collection Time: 02/11/19 12:31 PM   Specimen: BLOOD  Result Value Ref Range Status   Specimen Description BLOOD PORT  Final   Special Requests   Final    BOTTLES DRAWN AEROBIC AND ANAEROBIC Blood Culture adequate volume   Culture  Setup Time   Final    GRAM POSITIVE COCCI ANAEROBIC BOTTLE ONLY CRITICAL RESULT CALLED TO, READ BACK BY AND VERIFIED WITH: SCOTT HALL AT [id number] 02/12/2019 SNG  Performed at [facility], [address], [place], [place] [address]    Culture [facility] POSITIVE COCCI  Final   Report Status PENDING  Incomplete  Culture, blood (routine x 2)     Status: None (Preliminary result)   Collection Time: 02/11/19  3:57 PM   Specimen: BLOOD  Result Value Ref Range Status   Specimen Description BLOOD PORTA CATH  Final   Special Requests   Final    BOTTLES DRAWN AEROBIC AND ANAEROBIC Blood Culture adequate volume   Culture   Final    NO GROWTH < 24 HOURS Performed at [facility], [address], [place], [place] [address]    Report Status PENDING  Incomplete    Coagulation Studies: Recent Labs    02/09/19 1143 02/10/19 0500  LABPROT 13.8 13.6  INR 1.1 1.1    Urinalysis: No results for input(s): COLORURINE, LABSPEC, PHURINE,  GLUCOSEU, HGBUR, BILIRUBINUR, KETONESUR, PROTEINUR, UROBILINOGEN, NITRITE, LEUKOCYTESUR in the last 72 hours.  Invalid input(s): APPERANCEUR    Imaging: EEG  Result Date: 02/11/2019 Lora Havens, MD     02/11/2019  7:37 PM Patient Name: Jamie Arias MRN: [id number] Epilepsy Attending: Lora Havens Referring Physician/Provider: Dr  Vernard Gambles Date: 02/11/2019 Duration: 36.84mins Patient history: 63yo F with ams and jaw twitching. EEG to evaluate for seizure Level of alertness: comatose AEDs during EEG study: None Technical aspects: This EEG study was done with scalp electrodes positioned according to the 10-20 International system of electrode placement. Electrical activity was acquired at a sampling rate of 500Hz  and reviewed with a high frequency filter of 70Hz  and a low frequency filter of 1Hz . EEG data were recorded continuously and digitally stored. DESCRIPTION: EEG showed continuous generalized 3-5hz  theta-delta slowing. Triphasic waves, generalized, maximal bifrontal, at 1.5-2Hz  were also seen. Multiple episodes of jaw twitching were recorded without eeg change. Hyperventilation and photic stimulation were not performed. ABNORMALITY - Continuous slow, generalized - Triphasic waves, generalized IMPRESSION: This study is suggestive of severe diffuse encephalopathy, non specific to etiology but could be secondary to toxic-metabolic causes. Multiple episodes of jaw twitching were recorded without EEG correlate and were most likely non epileptic. No seizures or epileptiform discharges were seen throughout the recording. Priyanka Barbra Sarks   US Venous Img Upper Uni Left (DVT)  Result Date: 02/11/2019 CLINICAL DATA:  Left upper extremity edema. History of pulmonary embolism. Patient is currently on anticoagulation. Evaluate for DVT. EXAM: LEFT UPPER EXTREMITY VENOUS DOPPLER ULTRASOUND TECHNIQUE: Gray-scale sonography with graded compression, as well as color Doppler and duplex ultrasound were  performed to evaluate the upper extremity deep venous system from the level of the subclavian vein and including the jugular, axillary, basilic, radial, ulnar and upper cephalic vein. Spectral Doppler was utilized to evaluate flow at rest and with distal augmentation maneuvers. COMPARISON:  None. FINDINGS: Contralateral Subclavian Vein: Respiratory phasicity is normal and symmetric with the symptomatic side. No evidence of thrombus. Normal compressibility. Internal Jugular Vein: No evidence of thrombus. Normal compressibility, respiratory phasicity and response to augmentation. Subclavian Vein: No evidence of thrombus. Normal compressibility, respiratory phasicity and response to augmentation. Axillary Vein: No evidence of thrombus. Normal compressibility, respiratory phasicity and response to augmentation. Cephalic Vein: No evidence of thrombus. Normal compressibility, respiratory phasicity and response to augmentation. Basilic Vein: No evidence of thrombus. Normal compressibility, respiratory phasicity and response to augmentation. Brachial Veins: There is age-indeterminate hypoechoic occlusive thrombus within the mid humeral aspect of one of the paired left brachial veins (image 19). The adjacent brachial vein appears patent where imaged. Radial Veins: No evidence of thrombus. Normal compressibility, respiratory phasicity and response to augmentation. Ulnar Veins: No evidence of thrombus. Normal compressibility, respiratory phasicity and response to augmentation. Venous Reflux:  None visualized. Other Findings:  None visualized. IMPRESSION: Examination is positive for age-indeterminate short segment occlusive DVT involving the mid humeral aspect of one of the paired brachial veins. There is no extension of this short-segment occlusive DVT to the more proximal venous system of the left upper extremity. Electronically Signed   By: Sandi Mariscal M.D.   On: 02/11/2019 11:34   ECHOCARDIOGRAM COMPLETE  Result Date:  02/10/2019   ECHOCARDIOGRAM REPORT   Patient Name:   Jamie Arias Date of Exam: 02/10/2019 Medical Rec #:  [id number]      Height:       64.0 in Accession #:    [id number]     Weight:       172.2 lb Date of Birth:  1955-09-04      BSA:          1.84 m Patient Age:    63 years       BP:  82/44 mmHg Patient Gender: F              HR:           86 bpm. Exam Location:  [facility] Procedure: 2D Echo, Cardiac Doppler and Color Doppler Indications:     I48.91 Atrial Fibrillation  History:         Patient has prior history of Echocardiogram examinations, most                  recent 02/13/2018. Risk Factors:Hypertension, Dyslipidemia and                  Diabetes. Pulmonary hypertension. Pleural effusion. Myocardial                  infarction. Chronic diastolic heart failure. Coronary artery                  disease. End stage renal disease.  Sonographer:     Wilford Sports Rodgers-Jones Referring Phys:  [id number] Jennye Boroughs Diagnosing Phys: Nelva Bush MD IMPRESSIONS  1. Left ventricular ejection fraction, by visual estimation, is 60 to 65%. The left ventricle has normal function. There is mildly increased left ventricular hypertrophy.  2. Elevated left atrial pressure.  3. Left ventricular diastolic parameters are consistent with Grade II diastolic dysfunction (pseudonormalization).  4. The left ventricle has no regional wall motion abnormalities.  5. Global right ventricle has mildly reduced systolic function.The right ventricular size is mildly enlarged. Mildly increased right ventricular wall thickness.  6. Left atrial size was normal.  7. Right atrial size was normal.  8. Mild mitral annular calcification.  9. The mitral valve is normal in structure. Trace mitral valve regurgitation. No evidence of mitral stenosis. 10. The tricuspid valve is normal in structure. Tricuspid valve regurgitation moderate-severe. 11. The aortic valve is tricuspid. Aortic valve regurgitation is trivial. Mild to moderate aortic valve  sclerosis/calcification without any evidence of aortic stenosis. 12. The pulmonic valve was grossly normal. Pulmonic valve regurgitation is trivial. 13. Mildly elevated pulmonary artery systolic pressure. 14. The inferior vena cava is normal in size with <50% respiratory variability, suggesting right atrial pressure of 8 mmHg. FINDINGS  Left Ventricle: Left ventricular ejection fraction, by visual estimation, is 60 to 65%. The left ventricle has normal function. The left ventricle has no regional wall motion abnormalities. The left ventricular internal cavity size was the left ventricle is normal in size. There is mildly increased left ventricular hypertrophy. Left ventricular diastolic parameters are consistent with Grade II diastolic dysfunction (pseudonormalization). Elevated left atrial pressure. Right Ventricle: The right ventricular size is mildly enlarged. Mildly increased right ventricular wall thickness. Global RV systolic function is has mildly reduced systolic function. The tricuspid regurgitant velocity is 2.74 m/s, and with an assumed right atrial pressure of 8 mmHg, the estimated right ventricular systolic pressure is mildly elevated at 38.0 mmHg. Left Atrium: Left atrial size was normal in size. Right Atrium: Right atrial size was normal in size Pericardium: There is no evidence of pericardial effusion. Mitral Valve: The mitral valve is normal in structure. Mild mitral annular calcification. No evidence of mitral valve stenosis by observation. Trace mitral valve regurgitation. Tricuspid Valve: The tricuspid valve is normal in structure. Tricuspid valve regurgitation moderate-severe. Aortic Valve: The aortic valve is tricuspid. . There is mild thickening and mild calcification of the aortic valve. Aortic valve regurgitation is trivial. Mild to moderate aortic valve sclerosis/calcification is present, without any evidence of aortic stenosis. There is mild thickening  of the aortic valve. There is mild  calcification of the aortic valve. Pulmonic Valve: The pulmonic valve was grossly normal. Pulmonic valve regurgitation is trivial. No evidence of pulmonic stenosis. Aorta: The aortic root is normal in size and structure. Pulmonary Artery: The pulmonary artery is not well seen. Venous: The inferior vena cava is normal in size with less than 50% respiratory variability, suggesting right atrial pressure of 8 mmHg. IAS/Shunts: No atrial level shunt detected by color flow Doppler.  LEFT VENTRICLE PLAX 2D LVIDd:         3.77 cm  Diastology LVIDs:         2.71 cm  LV e' lateral:   9.36 cm/s LV PW:         1.08 cm  LV E/e' lateral: 13.1 LV IVS:        0.91 cm  LV e' medial:    6.53 cm/s LVOT diam:     1.90 cm  LV E/e' medial:  18.8 LV SV:         34 ml LV SV Index:   17.62 LVOT Area:     2.84 cm  RIGHT VENTRICLE RV Basal diam:  4.52 cm RV S prime:     9.03 cm/s TAPSE (M-mode): 1.5 cm LEFT ATRIUM             Index       RIGHT ATRIUM           Index LA diam:        3.80 cm 2.07 cm/m  RA Area:     16.10 cm LA Vol (A2C):   41.1 ml 22.39 ml/m RA Volume:   44.50 ml  24.24 ml/m LA Vol (A4C):   52.7 ml 28.71 ml/m LA Biplane Vol: 48.4 ml 26.37 ml/m  AORTIC VALVE LVOT Vmax:   107.50 cm/s LVOT Vmean:  80.450 cm/s LVOT VTI:    0.244 m  AORTA Ao Root diam: 3.00 cm MITRAL VALVE                         TRICUSPID VALVE MV Area (PHT): 3.08 cm              TR Peak grad:   30.0 mmHg MV PHT:        71.34 msec            TR Vmax:        274.00 cm/s MV Decel Time: 246 msec MV E velocity: 123.00 cm/s 103 cm/s  SHUNTS MV A velocity: 104.00 cm/s 70.3 cm/s Systemic VTI:  0.24 m MV E/A ratio:  1.18        1.5       Systemic Diam: 1.90 cm  Nelva Bush MD Electronically signed by Nelva Bush MD Signature Date/Time: 02/10/2019/7:19:39 PM    Final      Medications:   . sodium chloride 250 mL (02/10/19 0054)  . albumin human Stopped (02/11/19 1827)  . magnesium sulfate bolus IVPB    . norepinephrine (LEVOPHED) Adult infusion  Stopped (02/12/19 0450)  . potassium chloride 10 mEq (02/12/19 0829)  . vancomycin     . chlorhexidine  15 mL Mouth Rinse BID  . Chlorhexidine Gluconate Cloth  6 each Topical Q0600  . fluticasone  2 spray Each Nare Daily  . heparin  5,000 Units Subcutaneous Q8H  . insulin aspart  0-9 Units Subcutaneous TID WC  . mouth rinse  15 mL Mouth Rinse q12n4p  .  pantoprazole (PROTONIX) IV  40 mg Intravenous Q24H  . sodium chloride flush  3 mL Intravenous Once   sodium chloride, fentaNYL (SUBLIMAZE) injection, ondansetron (ZOFRAN) IV  Assessment/ Plan:  63 y.o. female with end-stage renal disease, atrial fibrillation, chronic diastolic congestive heart failure, CAD status post PCI, CABG,  type 2 diabetes, hypertension, hyperlipidemia, hypothyroidism, Covid positive in July 2020, recent nursing home stay in [place]  NEPHROLOGIST: Lavonia Dana MD  LOCATION: [place]: M-W-F 2nd Shift  EDW: 81.5 kg.  KIDNEY: [place] Optiflux 180NR  LITERS PROC: liters/treatment  HD TIME: 225  ACCESS: R Upper Arm AVF  NEEDLE SIZE:  ANTICOAG: Custom Heparin 2000  BATH: 2K/2.5Ca  QB: 400 ml/min  QD: 600 ml/min   1.  End-stage renal disease - plan for dialysis today - Patient still appears dry. Potassium low, will use 4 k bath - continue midodrine prior to HD  2.  Anemia of chronic kidney disease anemia of chronic kidney disease Lab Results  Component Value Date   HGB 7.8 (L) 02/12/2019  - will consider adding EPO with HD treatments  3. SHPTH - hold binders and non essential meds  4. Altered mental status - work up in progress, likely due to sepsis - appears to be improving some  5. Sepsis - blood culture positive for MRSA from 02/09/2019  ? Source; ID team following  6. Hypokalemia Intermittent iv replacement as needed       LOS: [place] 12/10/20208:46 AM  [place], [place]  Note: This note was  prepared with Dragon dictation. Any transcription errors are unintentional

## 2019-02-13 ENCOUNTER — Telehealth: Payer: Self-pay | Admitting: *Deleted

## 2019-02-13 ENCOUNTER — Ambulatory Visit: Payer: Medicare Other | Admitting: Family Medicine

## 2019-02-13 DIAGNOSIS — I96 Gangrene, not elsewhere classified: Secondary | ICD-10-CM

## 2019-02-13 DIAGNOSIS — I770 Arteriovenous fistula, acquired: Secondary | ICD-10-CM

## 2019-02-13 DIAGNOSIS — I82622 Acute embolism and thrombosis of deep veins of left upper extremity: Secondary | ICD-10-CM

## 2019-02-13 DIAGNOSIS — G9340 Encephalopathy, unspecified: Secondary | ICD-10-CM | POA: Diagnosis present

## 2019-02-13 DIAGNOSIS — I5032 Chronic diastolic (congestive) heart failure: Secondary | ICD-10-CM

## 2019-02-13 HISTORY — DX: Acute embolism and thrombosis of deep veins of left upper extremity: I82.622

## 2019-02-13 LAB — CULTURE, BLOOD (ROUTINE X 2): Special Requests: ADEQUATE

## 2019-02-13 LAB — RENAL FUNCTION PANEL
Albumin: 2.2 g/dL — ABNORMAL LOW (ref 3.5–5.0)
Anion gap: 13 (ref 5–15)
BUN: 17 mg/dL (ref 8–23)
CO2: 27 mmol/L (ref 22–32)
Calcium: 8.4 mg/dL — ABNORMAL LOW (ref 8.9–10.3)
Chloride: 103 mmol/L (ref 98–111)
Creatinine, Ser: 3.31 mg/dL — ABNORMAL HIGH (ref 0.44–1.00)
GFR calc Af Amer: 16 mL/min — ABNORMAL LOW (ref >60)
GFR calc non Af Amer: 14 mL/min — ABNORMAL LOW (ref >60)
Glucose, Bld: 108 mg/dL — ABNORMAL HIGH (ref 70–99)
Phosphorus: 1.7 mg/dL — ABNORMAL LOW (ref 2.5–4.6)
Potassium: 3.2 mmol/L — ABNORMAL LOW (ref 3.5–5.1)
Sodium: 143 mmol/L (ref 135–145)

## 2019-02-13 LAB — CBC
HCT: 24.7 % — ABNORMAL LOW (ref 36.0–46.0)
Hemoglobin: 7.9 g/dL — ABNORMAL LOW (ref 12.0–15.0)
MCH: 27.5 pg (ref 26.0–34.0)
MCHC: 32 g/dL (ref 30.0–36.0)
MCV: 86.1 fL (ref 80.0–100.0)
Platelets: 107 10*3/uL — ABNORMAL LOW (ref 150–400)
RBC: 2.87 MIL/uL — ABNORMAL LOW (ref 3.87–5.11)
RDW: 19.8 % — ABNORMAL HIGH (ref 11.5–15.5)
WBC: 6.9 10*3/uL (ref 4.0–10.5)
nRBC: 0 % (ref 0.0–0.2)

## 2019-02-13 LAB — GLUCOSE, CAPILLARY
Glucose-Capillary: 96 mg/dL (ref 70–99)
Glucose-Capillary: 92 mg/dL (ref 70–99)
Glucose-Capillary: 94 mg/dL (ref 70–99)
Glucose-Capillary: 100 mg/dL — ABNORMAL HIGH (ref 70–99)

## 2019-02-13 LAB — HEPARIN LEVEL (UNFRACTIONATED)
Heparin Unfractionated: 1.27 IU/mL — ABNORMAL HIGH (ref 0.30–0.70)
Heparin Unfractionated: 1.23 IU/mL — ABNORMAL HIGH (ref 0.30–0.70)

## 2019-02-13 LAB — APTT: aPTT: >160 seconds (ref 24–36)

## 2019-02-13 LAB — MAGNESIUM: Magnesium: 2.3 mg/dL (ref 1.7–2.4)

## 2019-02-13 MED ORDER — POTASSIUM PHOSPHATES 15 MMOLE/5ML IV SOLN
10.0000 mmol | Freq: Once | INTRAVENOUS | Status: AC
  Administered 2019-02-13: 10 mmol via INTRAVENOUS
  Filled 2019-02-13: qty 3.33

## 2019-02-13 MED ORDER — EPOETIN ALFA 10000 UNIT/ML IJ SOLN: 4000.0000 [IU] | Freq: Once | INTRAMUSCULAR | Status: DC

## 2019-02-13 MED ORDER — HEPARIN BOLUS VIA INFUSION
4500.0000 [IU] | Freq: Once | INTRAVENOUS | Status: AC
  Administered 2019-02-13: 4500 [IU] via INTRAVENOUS
  Filled 2019-02-13: qty 4500

## 2019-02-13 MED ORDER — VANCOMYCIN HCL IN DEXTROSE 1-5 GM/200ML-% IV SOLN
1000.0000 mg | Freq: Once | INTRAVENOUS | Status: AC
  Administered 2019-02-14: 15:00:00 1000 mg via INTRAVENOUS
  Filled 2019-02-13: qty 200

## 2019-02-13 MED ORDER — HEPARIN (PORCINE) 25000 UT/250ML-% IV SOLN
1200.0000 [IU]/h | INTRAVENOUS | Status: DC
  Administered 2019-02-13 – 2019-02-14 (×2): 1200 [IU]/h via INTRAVENOUS
  Filled 2019-02-13 (×2): qty 250

## 2019-02-13 NOTE — Telephone Encounter (Signed)
Noted. Patient is currently hospitalized.  ?

## 2019-02-13 NOTE — Progress Notes (Signed)
ID More alert Responding to questions husband at bed side Following commands  BP 137/77   Pulse 66   Temp 98.2 F (36.8 C) (Axillary)   Resp (!) 36   Ht 5\' 4"  (1.626 m)   Wt 78.2 kg   SpO2 100%   BMI 29.59 kg/m    Awake, less lethargic Involuntary tremor chin/jaw Chest b/l air entry [id number] Abd soft Cns- moves her upper extremities Weakness all four extremities      CBC Latest Ref Rng & Units 02/13/2019 02/12/2019 02/11/2019  WBC 4.0 - 10.5 K/uL 6.9 8.2 14.4(H)  Hemoglobin 12.0 - 15.0 g/dL 7.9(L) 7.8(L) 8.8(L)  Hematocrit 36.0 - 46.0 % 24.7(L) 24.4(L) 26.5(L)  Platelets 150 - 400 K/uL 107(L) 131(L) 140(L)    CMP Latest Ref Rng & Units 02/13/2019 02/12/2019 02/11/2019  Glucose 70 - 99 mg/dL 108(H) 118(H) 173(H)  BUN 8 - 23 mg/dL 17 16 12   Creatinine 0.44 - 1.00 mg/dL 3.31(H) 3.74(H) 3.01(H)  Sodium 135 - 145 mmol/L 143 145 142  Potassium 3.5 - 5.1 mmol/L 3.2(L) 2.6(LL) 2.9(L)  Chloride 98 - 111 mmol/L 103 103 101  CO2 22 - 32 mmol/L 27 30 28   Calcium 8.9 - 10.3 mg/dL 8.4(L) 8.2(L) 7.8(L)  Total Protein 6.5 - 8.1 g/dL - - -  Total Bilirubin 0.3 - 1.2 mg/dL - - -  Alkaline Phos 38 - 126 U/L - - -  AST 15 - 41 U/L - - -  ALT 0 - 44 U/L - - -    Microbiology 12/7 4/4 MRSA 12/9 1 of 4 MRSA  Impression/recommendation  MRSA bacteremia Unclear source- could be the AV fistula which has a stent and recently needed ballon venoplasty for stricture when she was at [facility] The left heel eschar  could be another source but less likely to cause high grade bacteremia TEE needed Continue vanco  Repeat Blood culture today  Encephalopathy  ESRD   Post covid morbidity  Discussed the management with her husband and her nurse ID will follow her peripherally this weekend- call if needed

## 2019-02-13 NOTE — Progress Notes (Signed)
Follow up - Critical Care Medicine Note  Patient Details:    Jamie Arias is an 64 y.o. female with end-stage renal disease with morbid obesity admitted to the stepdown unit ICU floor for severe septic shock secondary to MRSA bacteremia most likely source is skin infection complicated by metabolic encephalopathy  [id number] for TEE   Lines, Airways, Drains: CVC Triple Lumen 02/10/19 Right Femoral (Active)  Indication for Insertion or Continuance of Line Vasoactive infusions 02/11/19 0821  Site Assessment Clean;Dry;Intact 02/11/19 0821  Proximal Lumen Status Saline locked;Blood return noted 02/11/19 0821  Medial Lumen Status Infusing;Blood return noted 02/11/19 [id number]  Distal Lumen Status Saline locked;Flushed;Blood return noted 02/11/19 0821  Dressing Type Transparent 02/11/19 0821  Dressing Status Clean;Dry;Intact;Antimicrobial disc in place 02/11/19 [place] checked and tightened 02/11/19 [id number]  Dressing Intervention Dressing changed;Antimicrobial disc changed 02/10/19 2000  Dressing Change Due 02/17/19 02/11/19 [id number]     Arterial Line 02/10/19 Right Femoral (Active)  Site Assessment Clean;Dry;Intact 02/11/19 0821  Line Status Pulsatile blood flow 02/11/19 0821  Art Line Waveform Appropriate 02/11/19 0821  Art Line Interventions Connections checked and tightened;Zeroed and calibrated 02/11/19 0821  Color/Movement/Sensation Capillary refill less than 3 sec 02/11/19 0000  Dressing Type Transparent 02/11/19 0821  Dressing Status Clean;Dry;Intact 02/11/19 0821  Interventions Dressing changed;Antimicrobial disc changed 02/10/19 2000  Dressing Change Due 02/17/19 02/11/19 [id number]    Anti-infectives:  Anti-infectives (From admission, onward)   Start     Dose/Rate Route Frequency Ordered Stop   02/14/19 1200  vancomycin (VANCOCIN) IVPB 1000 mg/200 mL premix     1,000 mg 200 mL/hr over 60 Minutes Intravenous  Once 02/13/19 1058     02/12/19 1400  vancomycin (VANCOCIN) IVPB  1000 mg/200 mL premix     1,000 mg 200 mL/hr over 60 Minutes Intravenous  Once 02/11/19 1204 02/12/19 1400   02/11/19 1200  vancomycin (VANCOCIN) 500 mg in sodium chloride 0.9 % 100 mL IVPB  Status:  Discontinued     500 mg 100 mL/hr over 60 Minutes Intravenous Every M-W-F (Hemodialysis) 02/09/19 1726 02/10/19 0906   02/11/19 1200  vancomycin (VANCOCIN) IVPB 1000 mg/200 mL premix  Status:  Discontinued     1,000 mg 200 mL/hr over 60 Minutes Intravenous Every M-W-F (Hemodialysis) 02/10/19 0906 02/11/19 1204   02/10/19 1200  vancomycin (VANCOCIN) IVPB 1000 mg/200 mL premix  Status:  Discontinued     1,000 mg 200 mL/hr over 60 Minutes Intravenous  Once 02/10/19 0908 02/10/19 0922   02/10/19 1200  vancomycin (VANCOCIN) 1,500 mg in sodium chloride 0.9 % 500 mL IVPB  Status:  Discontinued     1,500 mg 250 mL/hr over 120 Minutes Intravenous  Once 02/10/19 0922 02/10/19 0933   02/10/19 1200  vancomycin (VANCOCIN) 1,500 mg in sodium chloride 0.9 % 500 mL IVPB     1,500 mg 250 mL/hr over 120 Minutes Intravenous  Once 02/10/19 0933 02/10/19 1232   02/09/19 2200  piperacillin-tazobactam (ZOSYN) IVPB 3.375 g  Status:  Discontinued     3.375 g 12.5 mL/hr over 240 Minutes Intravenous Every 12 hours 02/09/19 1719 02/10/19 0923   02/09/19 1718  vancomycin variable dose per unstable renal function (pharmacist dosing)  Status:  Discontinued      Does not apply See admin instructions 02/09/19 1719 02/09/19 1726   02/09/19 1500  ceFEPIme (MAXIPIME) 2 g in sodium chloride 0.9 % 100 mL IVPB     2 g 200 mL/hr over 30 Minutes Intravenous  Once 02/09/19 1453  02/09/19 1622   02/09/19 1500  metroNIDAZOLE (FLAGYL) IVPB 500 mg     500 mg 100 mL/hr over 60 Minutes Intravenous  Once 02/09/19 1453 02/09/19 1756   02/09/19 1500  vancomycin (VANCOCIN) IVPB 1000 mg/200 mL premix     1,000 mg 200 mL/hr over 60 Minutes Intravenous  Once 02/09/19 1453 02/09/19 1756      Microbiology: Results for orders placed or  performed during the hospital encounter of 02/09/19  Culture, blood (routine x 2)     Status: Abnormal   Collection Time: 02/09/19  2:29 PM   Specimen: BLOOD LEFT ARM  Result Value Ref Range Status   Specimen Description   Final    BLOOD LEFT ARM Performed at [facility], [address], [place], [place] [address]    Special Requests   Final    BOTTLES DRAWN AEROBIC AND ANAEROBIC Blood Culture adequate volume Performed at [facility], [address] [place], [place] [address]    Culture  Setup Time   Final    GRAM POSITIVE COCCI IN BOTH AEROBIC AND ANAEROBIC BOTTLES CRITICAL VALUE NOTED.  VALUE IS CONSISTENT WITH PREVIOUSLY REPORTED AND CALLED VALUE. Performed at [facility], [address] [place], [place] [address]    Culture (A)  Final    STAPHYLOCOCCUS AUREUS SUSCEPTIBILITIES PERFORMED ON PREVIOUS CULTURE WITHIN THE LAST 5 DAYS. Performed at [facility], [place] [address], [place], [place] [address]    Report Status 02/12/2019 FINAL  Final  Culture, blood (routine x 2)     Status: Abnormal   Collection Time: 02/09/19  2:38 PM   Specimen: BLOOD LEFT ARM  Result Value Ref Range Status   Specimen Description   Final    BLOOD LEFT ARM Performed at [facility], [address], [place], [place] [address]    Special Requests   Final    BOTTLES DRAWN AEROBIC AND ANAEROBIC Blood Culture adequate volume Performed at [facility], [address], [place], [place] [address]    Culture  Setup Time   Final    GRAM POSITIVE COCCI IN BOTH AEROBIC AND ANAEROBIC BOTTLES CRITICAL RESULT CALLED TO, READ BACK BY AND VERIFIED WITH: [place] [id number] 02/10/2019 HNM Performed at [facility], [place] [address] [place], [place] [address]    Culture METHICILLIN RESISTANT STAPHYLOCOCCUS AUREUS (A)  Final   Report Status 02/12/2019 FINAL  Final   Organism ID, Bacteria METHICILLIN RESISTANT STAPHYLOCOCCUS AUREUS  Final       Susceptibility   Methicillin resistant staphylococcus aureus - MIC*    CIPROFLOXACIN >=8 RESISTANT Resistant     ERYTHROMYCIN >=8 RESISTANT Resistant     GENTAMICIN <=0.5 SENSITIVE Sensitive     OXACILLIN >=4 RESISTANT Resistant     TETRACYCLINE <=1 SENSITIVE Sensitive     VANCOMYCIN 1 SENSITIVE Sensitive     TRIMETH/SULFA <=10 SENSITIVE Sensitive     CLINDAMYCIN RESISTANT Resistant     RIFAMPIN <=0.5 SENSITIVE Sensitive     Inducible Clindamycin POSITIVE Resistant     * METHICILLIN RESISTANT STAPHYLOCOCCUS AUREUS  Blood Culture ID Panel (Reflexed)     Status: Abnormal   Collection Time: 02/09/19  2:38 PM  Result Value Ref Range Status   Enterococcus species NOT DETECTED NOT DETECTED Final   Listeria monocytogenes NOT DETECTED NOT DETECTED Final   Staphylococcus species DETECTED (A) NOT DETECTED Final    Comment: CRITICAL RESULT CALLED TO, READ BACK BY AND VERIFIED WITH: SCOTT HALL  PHARMD [id number] 02/10/2019 HNM    Staphylococcus aureus (BCID) DETECTED (A) NOT DETECTED Final    Comment: Methicillin (oxacillin)-resistant Staphylococcus aureus (MRSA). MRSA is predictably resistant to beta-lactam antibiotics (except ceftaroline). Preferred therapy is vancomycin unless clinically contraindicated. Patient requires contact precautions if  hospitalized. CRITICAL RESULT CALLED TO, READ BACK BY AND VERIFIED WITH: Hart Robinsons PHARMD [id number] 02/10/2019 HNM    Methicillin resistance DETECTED (A) NOT DETECTED Final    Comment: CRITICAL RESULT CALLED TO, READ BACK BY AND VERIFIED WITH: Hart Robinsons PHARMD [id number] 02/10/2019 HNM    Streptococcus species NOT DETECTED NOT DETECTED Final   Streptococcus agalactiae NOT DETECTED NOT DETECTED Final   Streptococcus pneumoniae NOT DETECTED NOT DETECTED Final   Streptococcus pyogenes NOT DETECTED NOT DETECTED Final   Acinetobacter baumannii NOT DETECTED NOT DETECTED Final   Enterobacteriaceae species NOT DETECTED NOT DETECTED Final   Enterobacter cloacae complex NOT  DETECTED NOT DETECTED Final   Escherichia coli NOT DETECTED NOT DETECTED Final   Klebsiella oxytoca NOT DETECTED NOT DETECTED Final   Klebsiella pneumoniae NOT DETECTED NOT DETECTED Final   Proteus species NOT DETECTED NOT DETECTED Final   Serratia marcescens NOT DETECTED NOT DETECTED Final   Haemophilus influenzae NOT DETECTED NOT DETECTED Final   Neisseria meningitidis NOT DETECTED NOT DETECTED Final   Pseudomonas aeruginosa NOT DETECTED NOT DETECTED Final   Candida albicans NOT DETECTED NOT DETECTED Final   Candida glabrata NOT DETECTED NOT DETECTED Final   Candida krusei NOT DETECTED NOT DETECTED Final   Candida parapsilosis NOT DETECTED NOT DETECTED Final   Candida tropicalis NOT DETECTED NOT DETECTED Final    Comment: Performed at [facility], [place], [place] [address]  SARS CORONAVIRUS 2 (TAT 6-24 HRS) Nasopharyngeal Nasopharyngeal Swab     Status: None   Collection Time: 02/09/19  2:54 PM   Specimen: Nasopharyngeal Swab  Result Value Ref Range Status   SARS Coronavirus 2 NEGATIVE NEGATIVE Final    Comment: (NOTE) SARS-CoV-2 target nucleic acids are NOT DETECTED. The SARS-CoV-2 RNA is generally detectable in upper and lower respiratory specimens during the acute phase of infection. Negative results do not preclude SARS-CoV-2 infection, do not rule out co-infections with other pathogens, and should not be used as the sole basis for treatment or other patient management decisions. Negative results must be combined with clinical observations, patient history, and epidemiological information. The expected result is Negative. Fact Sheet for Patients: SugarRoll.be Fact Sheet for Healthcare Providers: https://www.woods-mathews.com/ This test is not yet approved or cleared by the [place] FDA and  has been authorized for detection and/or diagnosis of SARS-CoV-2 by FDA under an Emergency Use Authorization  (EUA). This EUA will remain  in effect (meaning this test can be used) for the duration of the COVID-19 declaration under Section 56 4(b)(1) of the Act, 21 U.S.C. section 360bbb-3(b)(1), unless the authorization is terminated or revoked sooner. Performed at [facility], [place] [address] [place], [place] [address]   MRSA PCR Screening     Status: Abnormal   Collection Time: 02/10/19  5:03 PM   Specimen: Nasopharyngeal  Result Value Ref Range Status   MRSA by PCR POSITIVE (A) NEGATIVE Final    Comment:        The GeneXpert MRSA Assay (FDA approved for NASAL specimens only), is one component of a comprehensive MRSA colonization surveillance program. It is not intended to diagnose MRSA infection nor to guide or monitor treatment for MRSA infections. RESULT CALLED TO, READ  BACK BY AND VERIFIED WITH: Britt Boozer @1839  02/10/19 MJU Performed at [facility], [address] [place], [place] [address]   Culture, blood (routine x 2)     Status: Abnormal   Collection Time: 02/11/19 12:31 PM   Specimen: BLOOD  Result Value Ref Range Status   Specimen Description   Final    BLOOD PORTA CATH Performed at [facility], [place] [address], [place], [place] [address]    Special Requests   Final    BOTTLES DRAWN AEROBIC AND ANAEROBIC Blood Culture adequate volume Performed at [facility], [address] [place], [place] [address]    Culture  Setup Time   Final    GRAM POSITIVE COCCI ANAEROBIC BOTTLE ONLY CRITICAL RESULT CALLED TO, READ BACK BY AND VERIFIED WITH: SCOTT HALL AT [id number] 02/12/2019 [facility]  Performed at [facility], [address] [place], [place] [address]    Culture (A)  Final    STAPHYLOCOCCUS AUREUS SUSCEPTIBILITIES PERFORMED ON PREVIOUS CULTURE WITHIN THE LAST 5 DAYS. Performed at [facility], [place] [address] [place], [place] [address]    Report Status 02/13/2019 FINAL  Final  Culture, blood (routine x 2)     Status:  None (Preliminary result)   Collection Time: 02/11/19  3:57 PM   Specimen: BLOOD  Result Value Ref Range Status   Specimen Description BLOOD PORTA CATH  Final   Special Requests   Final    BOTTLES DRAWN AEROBIC AND ANAEROBIC Blood Culture adequate volume   Culture   Final    NO GROWTH 2 DAYS Performed at [facility], [address], [place], [place] [address]    Report Status PENDING  Incomplete    Best Practice/Protocols:  VTE Prophylaxis: Heparin (SQ)   Events:   Studies: EEG  Result Date: 02/11/2019 Lora Havens, MD     02/11/2019  7:37 PM Patient Name: MARIALIS KUDER MRN: [id number] Epilepsy Attending: Lora Havens Referring Physician/Provider: Dr Vernard Gambles Date: 02/11/2019 Duration: 36.46mins Patient history: 63yo F with ams and jaw twitching. EEG to evaluate for seizure Level of alertness: comatose AEDs during EEG study: None Technical aspects: This EEG study was done with scalp electrodes positioned according to the 10-20 International system of electrode placement. Electrical activity was acquired at a sampling rate of 500Hz  and reviewed with a high frequency filter of 70Hz  and a low frequency filter of 1Hz . EEG data were recorded continuously and digitally stored. DESCRIPTION: EEG showed continuous generalized 3-5hz  theta-delta slowing. Triphasic waves, generalized, maximal bifrontal, at 1.5-2Hz  were also seen. Multiple episodes of jaw twitching were recorded without eeg change. Hyperventilation and photic stimulation were not performed. ABNORMALITY - Continuous slow, generalized - Triphasic waves, generalized IMPRESSION: This study is suggestive of severe diffuse encephalopathy, non specific to etiology but could be secondary to toxic-metabolic causes. Multiple episodes of jaw twitching were recorded without EEG correlate and were most likely non epileptic. No seizures or epileptiform discharges were seen throughout the recording. Lora Havens   CT Head  Wo Contrast  Result Date: 02/09/2019 CLINICAL DATA:  Altered level of consciousness, altered mental status EXAM: CT HEAD WITHOUT CONTRAST TECHNIQUE: Contiguous axial images were obtained from the base of the skull through the vertex without intravenous contrast. COMPARISON:  CT head August 23, 2018, MRI October 10, 2017 FINDINGS: Brain: No evidence of acute infarction, hemorrhage, hydrocephalus, extra-axial collection or mass lesion/mass effect. Symmetric prominence of the ventricles, cisterns and sulci compatible with  parenchymal volume loss. Patchy areas of white matter hypoattenuation are most compatible with chronic microvascular angiopathy. No Vascular: Atherosclerotic calcification of the carotid siphons and intradural vertebral arteries. No hyperdense vessel. Skull: No calvarial fracture or suspicious osseous lesion. No scalp swelling or hematoma. Sinuses/Orbits: Paranasal sinuses and mastoid air cells are predominantly clear. Orbital structures are unremarkable aside from prior lens extractions. Other: None IMPRESSION: 1. No acute intracranial abnormality. 2. Mild parenchymal atrophy and chronic microvascular angiopathy. Electronically Signed   By: Lovena Le M.D.   On: 02/09/2019 15:33   MR BRAIN WO CONTRAST  Result Date: 02/12/2019 CLINICAL DATA:  Encephalopathy and ataxia EXAM: MRI HEAD WITHOUT CONTRAST TECHNIQUE: Multiplanar, multiecho pulse sequences of the brain and surrounding structures were obtained without intravenous contrast. COMPARISON:  Brain MRI 10/10/2017 FINDINGS: BRAIN: There is no acute infarct, acute hemorrhage or extra-axial collection. Multifocal white matter hyperintensity, most commonly due to chronic ischemic microangiopathy. The cerebral and cerebellar volume are age-appropriate. There is no hydrocephalus. The midline structures are normal. VASCULAR: The major intracranial arterial and venous sinus flow voids are normal. Susceptibility-sensitive sequences show no chronic  microhemorrhage or superficial siderosis. SKULL AND UPPER CERVICAL SPINE: Calvarial bone marrow signal is normal. There is no skull base mass. The visualized upper cervical spine and soft tissues are normal. SINUSES/ORBITS: There are no fluid levels or advanced mucosal thickening. The mastoid air cells and middle ear cavities are free of fluid. The orbits are normal. IMPRESSION: 1. No acute intracranial abnormality. 2. Mild chronic small vessel disease. Electronically Signed   By: Ulyses Jarred M.D.   On: 02/12/2019 22:17   US Venous Img Upper Uni Left (DVT)  Result Date: 02/11/2019 CLINICAL DATA:  Left upper extremity edema. History of pulmonary embolism. Patient is currently on anticoagulation. Evaluate for DVT. EXAM: LEFT UPPER EXTREMITY VENOUS DOPPLER ULTRASOUND TECHNIQUE: Gray-scale sonography with graded compression, as well as color Doppler and duplex ultrasound were performed to evaluate the upper extremity deep venous system from the level of the subclavian vein and including the jugular, axillary, basilic, radial, ulnar and upper cephalic vein. Spectral Doppler was utilized to evaluate flow at rest and with distal augmentation maneuvers. COMPARISON:  None. FINDINGS: Contralateral Subclavian Vein: Respiratory phasicity is normal and symmetric with the symptomatic side. No evidence of thrombus. Normal compressibility. Internal Jugular Vein: No evidence of thrombus. Normal compressibility, respiratory phasicity and response to augmentation. Subclavian Vein: No evidence of thrombus. Normal compressibility, respiratory phasicity and response to augmentation. Axillary Vein: No evidence of thrombus. Normal compressibility, respiratory phasicity and response to augmentation. Cephalic Vein: No evidence of thrombus. Normal compressibility, respiratory phasicity and response to augmentation. Basilic Vein: No evidence of thrombus. Normal compressibility, respiratory phasicity and response to augmentation. Brachial  Veins: There is age-indeterminate hypoechoic occlusive thrombus within the mid humeral aspect of one of the paired left brachial veins (image 19). The adjacent brachial vein appears patent where imaged. Radial Veins: No evidence of thrombus. Normal compressibility, respiratory phasicity and response to augmentation. Ulnar Veins: No evidence of thrombus. Normal compressibility, respiratory phasicity and response to augmentation. Venous Reflux:  None visualized. Other Findings:  None visualized. IMPRESSION: Examination is positive for age-indeterminate short segment occlusive DVT involving the mid humeral aspect of one of the paired brachial veins. There is no extension of this short-segment occlusive DVT to the more proximal venous system of the left upper extremity. Electronically Signed   By: Sandi Mariscal M.D.   On: 02/11/2019 11:34   DG Chest [facility] 1 View  Result  Date: 02/09/2019 CLINICAL DATA:  Chest pain EXAM: PORTABLE CHEST 1 VIEW COMPARISON:  08/23/2018 FINDINGS: There is bilateral mild interstitial thickening. There is a trace left pleural effusion. There is no focal consolidation. There is no pneumothorax. There is stable cardiomegaly. There is evidence of prior CABG. There is no acute osseous abnormality. IMPRESSION: Cardiomegaly with mild pulmonary vascular congestion. Electronically Signed   By: Kathreen Devoid   On: 02/09/2019 14:26   ECHOCARDIOGRAM COMPLETE  Result Date: 02/10/2019   ECHOCARDIOGRAM REPORT   Patient Name:   KRISTENA BALIAN Date of Exam: 02/10/2019 Medical Rec #:  [id number]      Height:       64.0 in Accession #:    [id number]     Weight:       172.2 lb Date of Birth:  1955/11/04      BSA:          1.84 m Patient Age:    47 years       BP:           82/44 mmHg Patient Gender: F              HR:           86 bpm. Exam Location:  [facility] Procedure: 2D Echo, Cardiac Doppler and Color Doppler Indications:     I48.91 Atrial Fibrillation  History:         Patient has prior history of  Echocardiogram examinations, most                  recent 02/13/2018. Risk Factors:Hypertension, Dyslipidemia and                  Diabetes. Pulmonary hypertension. Pleural effusion. Myocardial                  infarction. Chronic diastolic heart failure. Coronary artery                  disease. End stage renal disease.  Sonographer:     Wilford Sports Rodgers-Jones Referring Phys:  [id number] Jennye Boroughs Diagnosing Phys: Nelva Bush MD IMPRESSIONS  1. Left ventricular ejection fraction, by visual estimation, is 60 to 65%. The left ventricle has normal function. There is mildly increased left ventricular hypertrophy.  2. Elevated left atrial pressure.  3. Left ventricular diastolic parameters are consistent with Grade II diastolic dysfunction (pseudonormalization).  4. The left ventricle has no regional wall motion abnormalities.  5. Global right ventricle has mildly reduced systolic function.The right ventricular size is mildly enlarged. Mildly increased right ventricular wall thickness.  6. Left atrial size was normal.  7. Right atrial size was normal.  8. Mild mitral annular calcification.  9. The mitral valve is normal in structure. Trace mitral valve regurgitation. No evidence of mitral stenosis. 10. The tricuspid valve is normal in structure. Tricuspid valve regurgitation moderate-severe. 11. The aortic valve is tricuspid. Aortic valve regurgitation is trivial. Mild to moderate aortic valve sclerosis/calcification without any evidence of aortic stenosis. 12. The pulmonic valve was grossly normal. Pulmonic valve regurgitation is trivial. 13. Mildly elevated pulmonary artery systolic pressure. 14. The inferior vena cava is normal in size with <50% respiratory variability, suggesting right atrial pressure of 8 mmHg. FINDINGS  Left Ventricle: Left ventricular ejection fraction, by visual estimation, is 60 to 65%. The left ventricle has normal function. The left ventricle has no regional wall motion abnormalities. The  left ventricular internal cavity size was the left ventricle is normal in  size. There is mildly increased left ventricular hypertrophy. Left ventricular diastolic parameters are consistent with Grade II diastolic dysfunction (pseudonormalization). Elevated left atrial pressure. Right Ventricle: The right ventricular size is mildly enlarged. Mildly increased right ventricular wall thickness. Global RV systolic function is has mildly reduced systolic function. The tricuspid regurgitant velocity is 2.74 m/s, and with an assumed right atrial pressure of 8 mmHg, the estimated right ventricular systolic pressure is mildly elevated at 38.0 mmHg. Left Atrium: Left atrial size was normal in size. Right Atrium: Right atrial size was normal in size Pericardium: There is no evidence of pericardial effusion. Mitral Valve: The mitral valve is normal in structure. Mild mitral annular calcification. No evidence of mitral valve stenosis by observation. Trace mitral valve regurgitation. Tricuspid Valve: The tricuspid valve is normal in structure. Tricuspid valve regurgitation moderate-severe. Aortic Valve: The aortic valve is tricuspid. . There is mild thickening and mild calcification of the aortic valve. Aortic valve regurgitation is trivial. Mild to moderate aortic valve sclerosis/calcification is present, without any evidence of aortic stenosis. There is mild thickening of the aortic valve. There is mild calcification of the aortic valve. Pulmonic Valve: The pulmonic valve was grossly normal. Pulmonic valve regurgitation is trivial. No evidence of pulmonic stenosis. Aorta: The aortic root is normal in size and structure. Pulmonary Artery: The pulmonary artery is not well seen. Venous: The inferior vena cava is normal in size with less than 50% respiratory variability, suggesting right atrial pressure of 8 mmHg. IAS/Shunts: No atrial level shunt detected by color flow Doppler.  LEFT VENTRICLE PLAX 2D LVIDd:         3.77 cm   Diastology LVIDs:         2.71 cm  LV e' lateral:   9.36 cm/s LV PW:         1.08 cm  LV E/e' lateral: 13.1 LV IVS:        0.91 cm  LV e' medial:    6.53 cm/s LVOT diam:     1.90 cm  LV E/e' medial:  18.8 LV SV:         34 ml LV SV Index:   17.62 LVOT Area:     2.84 cm  RIGHT VENTRICLE RV Basal diam:  4.52 cm RV S prime:     9.03 cm/s TAPSE (M-mode): 1.5 cm LEFT ATRIUM             Index       RIGHT ATRIUM           Index LA diam:        3.80 cm 2.07 cm/m  RA Area:     16.10 cm LA Vol (A2C):   41.1 ml 22.39 ml/m RA Volume:   44.50 ml  24.24 ml/m LA Vol (A4C):   52.7 ml 28.71 ml/m LA Biplane Vol: 48.4 ml 26.37 ml/m  AORTIC VALVE LVOT Vmax:   107.50 cm/s LVOT Vmean:  80.450 cm/s LVOT VTI:    0.244 m  AORTA Ao Root diam: 3.00 cm MITRAL VALVE                         TRICUSPID VALVE MV Area (PHT): 3.08 cm              TR Peak grad:   30.0 mmHg MV PHT:        71.34 msec            TR Vmax:  274.00 cm/s MV Decel Time: 246 msec MV E velocity: 123.00 cm/s 103 cm/s  SHUNTS MV A velocity: 104.00 cm/s 70.3 cm/s Systemic VTI:  0.24 m MV E/A ratio:  1.18        1.5       Systemic Diam: 1.90 cm  Nelva Bush MD Electronically signed by Nelva Bush MD Signature Date/Time: 02/10/2019/7:19:39 PM    Final     Consults: Treatment Team:  Murlean Iba, MD Flora Lipps, MD   Subjective:    Overnight Issues: Slightly more interactive.  Twitching of the muscles of the lower jaw.  Occasionally moans that she is in pain but cannot localize.  Objective:  Vital signs for last 24 hours: Temp:  [98 F (36.7 C)-98.5 F (36.9 C)] 98.2 F (36.8 C) (12/11 0800) Pulse Rate:  [51-243] 66 (12/11 0900) Resp:  [11-53] 36 (12/11 0900) BP: (114-156)/(50-87) 137/77 (12/11 0900) SpO2:  [91 %-100 %] 100 % (12/11 0900) Weight:  [78.2 kg] 78.2 kg (12/10 1445)  Hemodynamic parameters for last 24 hours:    Intake/Output from previous day: 12/10 0701 - 12/11 0700 In: 492 [I.V.:0.7; IV Piggyback:491.3] Out: 0    Intake/Output this shift: No intake/output data recorded.  Vent settings for last 24 hours:    Physical Exam:  General: Chronically ill-appearing, lying in bed  HEENT moist oral mucous membranes  Pulm/lungs normal breathing effort, [place] O2, clear  CVS/Heart irregular rhythm  Abdomen: Soft, nontender, protuberant  Extremities: Trace edema, left arm dependent edema  Neurologic: Confused, occasionally moaning, answers questions with monosyllables, no obvious focality.  Twitching of the muscles of the lower jaw.  Skin: Warm/dry skin wounds as previous all POA  Access: AV fistula, right arm     Assessment/Plan:  Severe metabolic encephalopathy due to severe sepsis Adult failure to thrive Deconditioning post COVID-19 infection High risk for intubation continue to monitor EEG, consider MRI if develops localizing signs Supportive care Avoid sedatives No evidence of respiratory failure   ESRD on dialysis Dialysis per renal Medications dosed accordingly Electrolyte replacement as needed  Severe sepsis with septic shock MRSA Bacteremia use vasopressors to keep MAP>65 follow ABG and LA follow up cultures Continue vancomycin Volume resuscitation according to CVP  CARDIAC ICU monitoring No evidence of cardiac dysfunction  ID continue IV abx as per ID follow up cultures   NUTRITIONAL DIET-->NPO>> swallow eval as able Constipation protocol as indicated   ENDO  ICU hypoglycemic\Hyperglycemia protocol if needed   ELECTROLYTES follow labs as needed replace as needed pharmacy consulted and following   ACID BASE BALANCE The patient was noted to have perceived hypoxemia on ABG obtained yesterday.  However the patient had significant metabolic alkalosis and this likely represents a left shift in the oxygen dissociation curve.  As of today the patient is showing no need for oxygen and is not having issues with tachypnea.  Original issues with tachypnea  may have resulted due to encephalopathy and not necessarily to respiratory failure.    LOS: 4 days   Critical care provider statement:    Critical care time (minutes):  33   Critical care time was exclusive of:  Separately billable procedures and  treating other patients   Critical care was necessary to treat or prevent imminent or  life-threatening deterioration of the following conditions:   Post COVID-19 , encephalitis, sepsis, MRSA bacteremia, multiple comorbid conditions   Critical care was time spent personally by me on the following  activities:  Development of treatment plan with  patient or surrogate,  discussions with consultants, evaluation of patient's response to  treatment, examination of patient, obtaining history from patient or  surrogate, ordering and performing treatments and interventions, ordering  and review of laboratory studies and re-evaluation of patient's condition   I assumed direction of critical care for this patient from another  provider in my specialty: no      Ottie Glazier, M.D.  Pulmonary & [place]

## 2019-02-13 NOTE — Progress Notes (Signed)
[place], [place] 02/13/19  Subjective:   Hospital day # 4 Patient presented to the emergency room via EMS for abdominal pain and altered mental status.  Work-up so far shows negative ammonia, mildly elevated troponin, negative for alcohol.      Neuro: Came in with altered mental status and confusion.  Still quite lethargic, dystonic jaw movements cvs: Mouth appears still dry.  No pressors Pulm: room air today gi: No nausea or vomiting reported. No oral intake- too lethargic Renal:  Last H on Thursday; potassium low but improving ID: MRSA bacteremia; isolation    Objective:  Vital signs in last 24 hours:  Temp:  [98 F (36.7 C)-98.5 F (36.9 C)] 98.5 F (36.9 C) (12/11 0400) Pulse Rate:  [51-243] 61 (12/11 0600) Resp:  [11-60] 27 (12/11 0600) BP: (114-156)/(42-87) 156/65 (12/11 0600) SpO2:  [91 %-100 %] 100 % (12/11 0600) Arterial Line BP: (147)/(53) 147/53 (12/10 0900) Weight:  [78 kg-78.2 kg] 78.2 kg (12/10 1445)  Weight change:  Filed Weights   02/10/19 1700 02/12/19 1130 02/12/19 1445  Weight: 78.1 kg 78 kg 78.2 kg    Intake/Output:    Intake/Output Summary (Last 24 hours) at 02/13/2019 0830 Last data filed at 02/13/2019 0600 Gross per 24 hour  Intake 392.01 ml  Output 0 ml  Net 392.01 ml     Physical Exam: General:  Critically ill-appearing, lying in the bed  HEENT  dry oral mucous membranes  Pulm/lungs  shallow breathing effort,  Clear to auscultation  CVS/Heart  regular rhythm in 60's  Abdomen:   Soft, nontender  Extremities:  b/l arm dependent edema  Neurologic:  lethargic today; opens eyes to voice  Skin:  Warm  Access:  AV fistula, right arm       Basic Metabolic Panel:  Recent Labs  Lab 02/09/19 1143 02/10/19 0500 02/11/19 0825 02/12/19 0444 02/13/19 0453  NA 140 140 142 145 143  K 3.7 3.0* 2.9* 2.6* 3.2*  CL 99 99 101 103 103  CO2 23 27 28 30 27   GLUCOSE 101* 88 173* 118* 108*  BUN 16 17 12 16 17    CREATININE 5.04* 4.12* 3.01* 3.74* 3.31*  CALCIUM 8.8* 7.9* 7.8* 8.2* 8.4*  MG  --   --  1.7 1.7 2.3  PHOS  --   --   --  2.0* 1.7*     CBC: Recent Labs  Lab 02/09/19 1143 02/10/19 0500 02/11/19 0825 02/12/19 0444 02/13/19 0453  WBC 15.8* 6.7 14.4* 8.2 6.9  NEUTROABS 14.9*  --   --   --   --   HGB 10.7* 9.0* 8.8* 7.8* 7.9*  HCT 34.1* 26.6* 26.5* 24.4* 24.7*  MCV 85.0 81.6 81.5 84.4 86.1  PLT 215 172 140* 131* 107*      Lab Results  Component Value Date   HEPBSAG NON REACTIVE 02/10/2019      Microbiology:  Recent Results (from the past 240 hour(s))  Culture, blood (routine x 2)     Status: Abnormal   Collection Time: 02/09/19  2:29 PM   Specimen: BLOOD LEFT ARM  Result Value Ref Range Status   Specimen Description   Final    BLOOD LEFT ARM Performed at [facility], [address] [place], [place] [address]    Special Requests   Final    BOTTLES DRAWN AEROBIC AND ANAEROBIC Blood Culture adequate volume Performed at [facility], [address] [place], [place] [address]    Culture  Setup Time   Final    GRAM POSITIVE COCCI IN BOTH AEROBIC AND ANAEROBIC BOTTLES CRITICAL VALUE NOTED.  VALUE IS CONSISTENT WITH PREVIOUSLY REPORTED AND CALLED VALUE. Performed at [facility], [address] [place], [place] [address]    Culture (A)  Final    STAPHYLOCOCCUS AUREUS SUSCEPTIBILITIES PERFORMED ON PREVIOUS CULTURE WITHIN THE LAST 5 DAYS. Performed at [facility], [place] [address] [place], [place] [address]    Report Status 02/12/2019 FINAL  Final  Culture, blood (routine x 2)     Status: Abnormal   Collection Time: 02/09/19  2:38 PM   Specimen: BLOOD LEFT ARM  Result Value Ref Range Status   Specimen Description   Final    BLOOD LEFT ARM Performed at [facility], [address] [place], [place] [address]    Special Requests   Final    BOTTLES DRAWN AEROBIC AND ANAEROBIC Blood Culture adequate  volume Performed at [facility], [address], [place], [place] [address]    Culture  Setup Time   Final    GRAM POSITIVE COCCI IN BOTH AEROBIC AND ANAEROBIC BOTTLES CRITICAL RESULT CALLED TO, READ BACK BY AND VERIFIED WITH: [place] [id number] 02/10/2019 HNM Performed at [facility], [place] [address] [place], [place] [address]    Culture METHICILLIN RESISTANT STAPHYLOCOCCUS AUREUS (A)  Final   Report Status 02/12/2019 FINAL  Final   Organism ID, Bacteria METHICILLIN RESISTANT STAPHYLOCOCCUS AUREUS  Final      Susceptibility   Methicillin resistant staphylococcus aureus - MIC*    CIPROFLOXACIN >=8 RESISTANT Resistant     ERYTHROMYCIN >=8 RESISTANT Resistant     GENTAMICIN <=0.5 SENSITIVE Sensitive     OXACILLIN >=4 RESISTANT Resistant     TETRACYCLINE <=1 SENSITIVE Sensitive     VANCOMYCIN 1 SENSITIVE Sensitive     TRIMETH/SULFA <=10 SENSITIVE Sensitive     CLINDAMYCIN RESISTANT Resistant     RIFAMPIN <=0.5 SENSITIVE Sensitive     Inducible Clindamycin POSITIVE Resistant     * METHICILLIN RESISTANT STAPHYLOCOCCUS AUREUS  Blood Culture ID Panel (Reflexed)     Status: Abnormal   Collection Time: 02/09/19  2:38 PM  Result Value Ref Range Status   Enterococcus species NOT DETECTED NOT DETECTED Final   Listeria monocytogenes NOT DETECTED NOT DETECTED Final   Staphylococcus species DETECTED (A) NOT DETECTED Final    Comment: CRITICAL RESULT CALLED TO, READ BACK BY AND VERIFIED WITH: Hart Robinsons PHARMD [id number] 02/10/2019 HNM    Staphylococcus aureus (BCID) DETECTED (A) NOT DETECTED Final    Comment: Methicillin (oxacillin)-resistant Staphylococcus aureus (MRSA). MRSA is predictably resistant to beta-lactam antibiotics (except ceftaroline). Preferred therapy is vancomycin unless clinically contraindicated. Patient requires contact precautions if  hospitalized. CRITICAL RESULT CALLED TO, READ BACK BY AND VERIFIED WITH: Hart Robinsons PHARMD [id number] 02/10/2019 HNM    Methicillin  resistance DETECTED (A) NOT DETECTED Final    Comment: CRITICAL RESULT CALLED TO, READ BACK BY AND VERIFIED WITH: Hart Robinsons PHARMD [id number] 02/10/2019 HNM    Streptococcus species NOT DETECTED NOT DETECTED Final   Streptococcus agalactiae NOT DETECTED NOT DETECTED Final   Streptococcus pneumoniae NOT DETECTED NOT DETECTED Final   Streptococcus pyogenes NOT DETECTED NOT DETECTED Final   Acinetobacter baumannii NOT DETECTED NOT DETECTED Final   Enterobacteriaceae species NOT DETECTED NOT DETECTED Final   Enterobacter cloacae complex NOT DETECTED NOT DETECTED Final   Escherichia coli NOT DETECTED NOT DETECTED Final   Klebsiella oxytoca NOT  DETECTED NOT DETECTED Final   Klebsiella pneumoniae NOT DETECTED NOT DETECTED Final   Proteus species NOT DETECTED NOT DETECTED Final   Serratia marcescens NOT DETECTED NOT DETECTED Final   Haemophilus influenzae NOT DETECTED NOT DETECTED Final   Neisseria meningitidis NOT DETECTED NOT DETECTED Final   Pseudomonas aeruginosa NOT DETECTED NOT DETECTED Final   Candida albicans NOT DETECTED NOT DETECTED Final   Candida glabrata NOT DETECTED NOT DETECTED Final   Candida krusei NOT DETECTED NOT DETECTED Final   Candida parapsilosis NOT DETECTED NOT DETECTED Final   Candida tropicalis NOT DETECTED NOT DETECTED Final    Comment: Performed at [facility], [place], [place] [address]  SARS CORONAVIRUS 2 (TAT 6-24 HRS) Nasopharyngeal Nasopharyngeal Swab     Status: None   Collection Time: 02/09/19  2:54 PM   Specimen: Nasopharyngeal Swab  Result Value Ref Range Status   SARS Coronavirus 2 NEGATIVE NEGATIVE Final    Comment: (NOTE) SARS-CoV-2 target nucleic acids are NOT DETECTED. The SARS-CoV-2 RNA is generally detectable in upper and lower respiratory specimens during the acute phase of infection. Negative results do not preclude SARS-CoV-2 infection, do not rule out co-infections with other pathogens, and should not be used as  the sole basis for treatment or other patient management decisions. Negative results must be combined with clinical observations, patient history, and epidemiological information. The expected result is Negative. Fact Sheet for Patients: SugarRoll.be Fact Sheet for Healthcare Providers: https://www.woods-mathews.com/ This test is not yet approved or cleared by the [place] FDA and  has been authorized for detection and/or diagnosis of SARS-CoV-2 by FDA under an Emergency Use Authorization (EUA). This EUA will remain  in effect (meaning this test can be used) for the duration of the COVID-19 declaration under Section 56 4(b)(1) of the Act, 21 U.S.C. section 360bbb-3(b)(1), unless the authorization is terminated or revoked sooner. Performed at [facility], [place] [address], [place], [place] [address]   MRSA PCR Screening     Status: Abnormal   Collection Time: 02/10/19  5:03 PM   Specimen: Nasopharyngeal  Result Value Ref Range Status   MRSA by PCR POSITIVE (A) NEGATIVE Final    Comment:        The GeneXpert MRSA Assay (FDA approved for NASAL specimens only), is one component of a comprehensive MRSA colonization surveillance program. It is not intended to diagnose MRSA infection nor to guide or monitor treatment for MRSA infections. RESULT CALLED TO, READ BACK BY AND VERIFIED WITH: Britt Boozer @1839  02/10/19 MJU Performed at [facility], [address] [place], [place] [address]   Culture, blood (routine x 2)     Status: None (Preliminary result)   Collection Time: 02/11/19 12:31 PM   Specimen: BLOOD  Result Value Ref Range Status   Specimen Description   Final    BLOOD PORTA CATH Performed at [facility], [place] [address], [place], [place] [address]    Special Requests   Final    BOTTLES DRAWN AEROBIC AND ANAEROBIC Blood Culture adequate volume   Culture  Setup Time   Final    GRAM POSITIVE  COCCI ANAEROBIC BOTTLE ONLY CRITICAL RESULT CALLED TO, READ BACK BY AND VERIFIED WITH: SCOTT HALL AT [id number] 02/12/2019 [facility]  Performed at [facility], [address] [place], [place] [address]    Culture [facility] POSITIVE COCCI  Final   Report Status PENDING  Incomplete  Culture, blood (routine x 2)  Status: None (Preliminary result)   Collection Time: 02/11/19  3:57 PM   Specimen: BLOOD  Result Value Ref Range Status   Specimen Description BLOOD PORTA CATH  Final   Special Requests   Final    BOTTLES DRAWN AEROBIC AND ANAEROBIC Blood Culture adequate volume   Culture   Final    NO GROWTH 2 DAYS Performed at [facility], [address] [place], [place] [address]    Report Status PENDING  Incomplete    Coagulation Studies: No results for input(s): LABPROT, INR in the last 72 hours.  Urinalysis: No results for input(s): COLORURINE, LABSPEC, PHURINE, GLUCOSEU, HGBUR, BILIRUBINUR, KETONESUR, PROTEINUR, UROBILINOGEN, NITRITE, LEUKOCYTESUR in the last 72 hours.  Invalid input(s): APPERANCEUR    Imaging: EEG  Result Date: 02/11/2019 Lora Havens, MD     02/11/2019  7:37 PM Patient Name: JANIJAH VELLA MRN: [id number] Epilepsy Attending: Lora Havens Referring Physician/Provider: Dr Vernard Gambles Date: 02/11/2019 Duration: 36.19mins Patient history: 63yo F with ams and jaw twitching. EEG to evaluate for seizure Level of alertness: comatose AEDs during EEG study: None Technical aspects: This EEG study was done with scalp electrodes positioned according to the 10-20 International system of electrode placement. Electrical activity was acquired at a sampling rate of 500Hz  and reviewed with a high frequency filter of 70Hz  and a low frequency filter of 1Hz . EEG data were recorded continuously and digitally stored. DESCRIPTION: EEG showed continuous generalized 3-5hz  theta-delta slowing. Triphasic waves, generalized, maximal bifrontal, at 1.5-2Hz  were also seen. Multiple  episodes of jaw twitching were recorded without eeg change. Hyperventilation and photic stimulation were not performed. ABNORMALITY - Continuous slow, generalized - Triphasic waves, generalized IMPRESSION: This study is suggestive of severe diffuse encephalopathy, non specific to etiology but could be secondary to toxic-metabolic causes. Multiple episodes of jaw twitching were recorded without EEG correlate and were most likely non epileptic. No seizures or epileptiform discharges were seen throughout the recording. Lora Havens   MR BRAIN WO CONTRAST  Result Date: 02/12/2019 CLINICAL DATA:  Encephalopathy and ataxia EXAM: MRI HEAD WITHOUT CONTRAST TECHNIQUE: Multiplanar, multiecho pulse sequences of the brain and surrounding structures were obtained without intravenous contrast. COMPARISON:  Brain MRI 10/10/2017 FINDINGS: BRAIN: There is no acute infarct, acute hemorrhage or extra-axial collection. Multifocal white matter hyperintensity, most commonly due to chronic ischemic microangiopathy. The cerebral and cerebellar volume are age-appropriate. There is no hydrocephalus. The midline structures are normal. VASCULAR: The major intracranial arterial and venous sinus flow voids are normal. Susceptibility-sensitive sequences show no chronic microhemorrhage or superficial siderosis. SKULL AND UPPER CERVICAL SPINE: Calvarial bone marrow signal is normal. There is no skull base mass. The visualized upper cervical spine and soft tissues are normal. SINUSES/ORBITS: There are no fluid levels or advanced mucosal thickening. The mastoid air cells and middle ear cavities are free of fluid. The orbits are normal. IMPRESSION: 1. No acute intracranial abnormality. 2. Mild chronic small vessel disease. Electronically Signed   By: Ulyses Jarred M.D.   On: 02/12/2019 22:17   US Venous Img Upper Uni Left (DVT)  Result Date: 02/11/2019 CLINICAL DATA:  Left upper extremity edema. History of pulmonary embolism. Patient is  currently on anticoagulation. Evaluate for DVT. EXAM: LEFT UPPER EXTREMITY VENOUS DOPPLER ULTRASOUND TECHNIQUE: Gray-scale sonography with graded compression, as well as color Doppler and duplex ultrasound were performed to evaluate the upper extremity deep venous system from the level of the subclavian vein and including the jugular, axillary, basilic, radial, ulnar and upper  cephalic vein. Spectral Doppler was utilized to evaluate flow at rest and with distal augmentation maneuvers. COMPARISON:  None. FINDINGS: Contralateral Subclavian Vein: Respiratory phasicity is normal and symmetric with the symptomatic side. No evidence of thrombus. Normal compressibility. Internal Jugular Vein: No evidence of thrombus. Normal compressibility, respiratory phasicity and response to augmentation. Subclavian Vein: No evidence of thrombus. Normal compressibility, respiratory phasicity and response to augmentation. Axillary Vein: No evidence of thrombus. Normal compressibility, respiratory phasicity and response to augmentation. Cephalic Vein: No evidence of thrombus. Normal compressibility, respiratory phasicity and response to augmentation. Basilic Vein: No evidence of thrombus. Normal compressibility, respiratory phasicity and response to augmentation. Brachial Veins: There is age-indeterminate hypoechoic occlusive thrombus within the mid humeral aspect of one of the paired left brachial veins (image 19). The adjacent brachial vein appears patent where imaged. Radial Veins: No evidence of thrombus. Normal compressibility, respiratory phasicity and response to augmentation. Ulnar Veins: No evidence of thrombus. Normal compressibility, respiratory phasicity and response to augmentation. Venous Reflux:  None visualized. Other Findings:  None visualized. IMPRESSION: Examination is positive for age-indeterminate short segment occlusive DVT involving the mid humeral aspect of one of the paired brachial veins. There is no extension of  this short-segment occlusive DVT to the more proximal venous system of the left upper extremity. Electronically Signed   By: Sandi Mariscal M.D.   On: 02/11/2019 11:34     Medications:   . sodium chloride 250 mL (02/10/19 0054)  . albumin human Stopped (02/12/19 1147)  . norepinephrine (LEVOPHED) Adult infusion Stopped (02/12/19 0450)   . aspirin  81 mg Per Tube Daily  . atorvastatin  40 mg Per Tube q1800  . chlorhexidine  15 mL Mouth Rinse BID  . Chlorhexidine Gluconate Cloth  6 each Topical Q0600  . Chlorhexidine Gluconate Cloth  6 each Topical Q0600  . FLUoxetine  20 mg Per Tube Daily  . fluticasone  2 spray Each Nare Daily  . heparin  5,000 Units Subcutaneous Q8H  . insulin aspart  0-9 Units Subcutaneous TID WC  . levothyroxine  88 mcg Per Tube QAC breakfast  . mouth rinse  15 mL Mouth Rinse q12n4p  . midodrine  10 mg Per Tube TID WC  . multivitamin  15 mL Per Tube Daily  . mupirocin ointment  1 application Nasal BID  . pantoprazole (PROTONIX) IV  40 mg Intravenous Q24H  . potassium & sodium phosphates  1 packet Per Tube Once  . sodium chloride flush  3 mL Intravenous Once  . venlafaxine  75 mg Per Tube BID WC  . zinc sulfate  220 mg Per Tube Daily   sodium chloride, fentaNYL (SUBLIMAZE) injection, ondansetron (ZOFRAN) IV  Assessment/ Plan:  63 y.o.caucasian female with end-stage renal disease, atrial fibrillation, chronic diastolic congestive heart failure, CAD status post PCI, CABG,  type 2 diabetes, hypertension, hyperlipidemia, hypothyroidism, Covid positive in July 2020, recent nursing home stay in [place]  NEPHROLOGIST: Lavonia Dana MD  LOCATION: [place]: M-W-F 2nd Shift  EDW: 81.5 kg.  KIDNEY: [place] Optiflux 180NR  LITERS PROC: liters/treatment  HD TIME: 225  ACCESS: R Upper Arm AVF  NEEDLE SIZE:  ANTICOAG: Custom Heparin 2000  BATH: 2K/2.5Ca  QB: 400 ml/min  QD: 600 ml/min   1.  End-stage renal disease - plan for dialysis on  saturday - Patient still appears dry. Potassium low, will use 4 k bath - continue midodrine prior to HD  2.  Anemia of chronic kidney disease anemia  of chronic kidney disease Lab Results  Component Value Date   HGB 7.9 (L) 02/13/2019  - will consider adding EPO with HD treatments  3. SHPTH - hold binders and non essential meds  4. Altered mental status - work up in progress, likely due to sepsis  5. Sepsis - blood culture positive for MRSA from 02/09/2019  ? Source; ID team following  6. Hypokalemia Intermittent iv replacement as needed  7. Left arm DVT        LOS: [place] 12/11/20208:30 AM  [place], [place]  Note: This note was prepared with Dragon dictation. Any transcription errors are unintentional

## 2019-02-13 NOTE — Consult Note (Signed)
Pharmacy Antibiotic Note  Jamie Arias is a 63 y.o. female admitted on 02/09/2019 with MRSA  bacteremia. Patient has history of ESRD on HD MWF. Missed dialysis yesterday. Blood cultures with 4/4 GPC and BCID detected MRSA. Source dialysis catheter? Pharmacy has been consulted for vancomycin dosing. ID consulted. Out-patient HD schedule is MWF, however schedule has been off while in-patient.   Per nephrology note, next dialysis session is planned for 12/12. WBC trending down and patient is afebrile. Repeat 12/9 Bcx with 1/4 bottles S aureus.   Plan:  Vancomycin 1000 mg x 1 tomorrow with dialysis  Continue to monitor dialysis plan  Plan to get trough level after 3-4 maintenance doses  Goal pre-HD vancomycin level 15 - 25 mcg/mL  Height: 5\' 4"  (162.6 cm) Weight: 172 lb 6.4 oz (78.2 kg) IBW/kg (Calculated) : 54.7  Temp (24hrs), Avg:98.2 F (36.8 C), Min:98 F (36.7 C), Max:98.5 F (36.9 C)  Recent Labs  Lab 02/09/19 1143 02/09/19 1438 02/09/19 1836 02/10/19 0500 02/10/19 2018 02/11/19 0825 02/12/19 0444 02/13/19 0453  WBC 15.8*  --   --  6.7  --  14.4* 8.2 6.9  CREATININE 5.04*  --   --  4.12*  --  3.01* 3.74* 3.31*  LATICACIDVEN  --  2.6* 1.4  --  1.0  --   --   --     Estimated Creatinine Clearance: 17.1 mL/min (A) (by C-G formula based on SCr of 3.31 mg/dL (H)).    Allergies  Allergen Reactions  . Nsaids Other (See Comments)    Contraindicated due to kidney disease.  Marland Kitchen Doxycycline Other (See Comments)    tremor    Antimicrobials this admission: Cefepime and metronidazole 12/7 x1  Pip/tazo 12/8 x1 Vancomycin 12/7 >>   Dose adjustments this admission: 12/8: Increased maintenance dose from 500 mg to 1000 mg qHD based on up-dated weight in chart  Microbiology results: 12/9 BCx: 1/4 bottles S aureus 12/7 BCx: 4/4 bottles GPC 12/7 BCID: MRSA 12/7 UCx: pending 12/7 SARS-CoV-2 NAAT: negative  Thank you for allowing pharmacy to be a part of this patient's  care.  [place] Resident 02/13/2019 10:59 AM

## 2019-02-13 NOTE — Consult Note (Signed)
PHARMACY CONSULT NOTE - FOLLOW UP  Pharmacy Consult for Electrolyte Monitoring and Replacement   Recent Labs: Potassium (mmol/L)  Date Value  02/13/2019 3.2 (L)  03/30/2014 4.3   Magnesium (mg/dL)  Date Value  02/13/2019 2.3  07/19/2013 1.7 (L)   Calcium (mg/dL)  Date Value  02/13/2019 8.4 (L)   Calcium, Total (mg/dL)  Date Value  03/30/2014 9.0   Albumin (g/dL)  Date Value  02/13/2019 2.2 (L)  02/11/2018 4.2  07/23/2013 2.7 (L)   Phosphorus (mg/dL)  Date Value  02/13/2019 1.7 (L)   Sodium (mmol/L)  Date Value  02/13/2019 143  03/30/2014 140   Corrected Ca: 9.8 mg/dL  Assessment: 63 y.o. female who presented to [facility] on 02/09/2019 with MRSA bacteremia. The patient received HD 12/10 at bedside and her next scheduled HD is tomorrow 12/12  Goal of Therapy:  Potassium 4.0 - 5.1 mmol/L Magnesium 2.0 - 2.4 mg/dL All Other Electrolytes WNL  Plan:   IV potassium phosphate 10 mmol x1 (this provides 14.7 mEq potassium): this was discussed with Dr Anette Riedel HD bath per Dr Candiss Norse  Renal function panel and magnesium follow-up in am  Dallie Piles ,PharmD Clinical Pharmacist 02/13/2019 7:48 AM

## 2019-02-13 NOTE — TOC Initial Note (Signed)
Transition of Care [facility]) - Initial/Assessment Note    Patient Details  Name: Jamie Arias MRN: [id number] Date of Birth: [birth year]  [facility] CM/SW Contact:    Shade Flood, LCSW Phone Number: 02/13/2019, 12:50 PM  Clinical Narrative:                  Pt admitted from home. She is high risk for readmission. Pt unable to participate in assessment today. Reviewed pt's record today and spoke with pt's husband by phone to assess. Pt was treated here for Covid this past summer. She was discharged to [facility] in [place] after that stay. Pt's husband states she discharged home about three weeks ago after using up all of her SNF benefits. Pt receives outpt HD at [facility]. Husband transports.  Pt's husband states that they have [facility] coming out for RN, PT, aide and SW but they haven't seen the aide or SW yet. He states that pt has not been able to ambulate and is basically bedbound. They have a lift, a wheelchair, walker and BSC.   Anticipating pt will need to return home with [facility] at dc. TOC will follow and assist with dc planning. Expected Discharge Plan: [place] Barriers to Discharge: Continued Medical Work up   Patient Goals and CMS Choice        Expected Discharge Plan and Services Expected Discharge Plan: [place] In-house Referral: Clinical Social Work     Living arrangements for the past 2 months: Mobile Home                                      Prior Living Arrangements/Services Living arrangements for the past 2 months: Mobile Home Lives with:: Spouse Patient language and need for interpreter reviewed:: Yes Do you feel safe going back to the place where you live?: Yes      Need for Family Participation in Patient Care: Yes (Comment) Care giver support system in place?: Yes (comment) Current home services: Homehealth aide, Home PT, Home RN, DME Criminal Activity/Legal Involvement  Pertinent to Current Situation/Hospitalization: No - Comment as needed  Activities of Daily Living Home Assistive Devices/Equipment: None ADL Screening (condition at time of admission) Patient's cognitive ability adequate to safely complete daily activities?: No Is the patient deaf or have difficulty hearing?: No Does the patient have difficulty seeing, even when wearing glasses/contacts?: No Does the patient have difficulty concentrating, remembering, or making decisions?: No(at home) Patient able to express need for assistance with ADLs?: No Does the patient have difficulty dressing or bathing?: Yes Independently performs ADLs?: No Communication: Needs assistance Is this a change from baseline?: Change from baseline, expected to last <3 days Dressing (OT): Needs assistance Is this a change from baseline?: Change from baseline, expected to last <3days Grooming: Needs assistance Is this a change from baseline?: Change from baseline, expected to last <3 days Feeding: Needs assistance Is this a change from baseline?: Change from baseline, expected to last <3 days Bathing: Needs assistance Is this a change from baseline?: Change from baseline, expected to last <3 days Toileting: Needs assistance Is this a change from baseline?: Change from baseline, expected to last <3 days In/Out Bed: Needs assistance Is this a change from baseline?: Change from baseline, expected to last <3 days Walks in Home: Needs assistance Is this a change from baseline?: Pre-admission  baseline Does the patient have difficulty walking or climbing stairs?: Yes Weakness of Legs: Both Weakness of Arms/Hands: Both  Permission Sought/Granted                  Emotional Assessment Appearance:: Appears stated age Attitude/Demeanor/Rapport: Unable to Assess Affect (typically observed): Unable to Assess Orientation: : Oriented to Self Alcohol / Substance Use: Not Applicable Psych Involvement: No  (comment)  Admission diagnosis:  ESRD (end stage renal disease) ([place]) [N18.6] Altered mental status, unspecified altered mental status type [R41.82] Chest pain, unspecified type [R07.9] Sepsis, due to unspecified organism, unspecified whether acute organ dysfunction present [facility]) [A41.9] Patient Active Problem List   Diagnosis Date Noted  . Pressure injury of skin 02/10/2019  . Sepsis due to methicillin resistant Staphylococcus aureus (MRSA) ([place]) 02/10/2019  . MRSA bacteremia 02/10/2019  . Altered level of consciousness 02/09/2019  . Adjustment disorder with depressed mood 08/31/2018  . COVID-19 virus infection 08/24/2018  . Acute on chronic respiratory failure with hypoxia ([place]) 08/24/2018  . UTI (urinary tract infection) 08/24/2018  . Allergic rhinitis 09/19/2017  . Uremia of renal origin 09/04/2017  . Chronic neck pain 07/25/2017  . Hyperkalemia 02/15/2017  . Complication from renal dialysis device 12/01/2016  . ESRD (end stage renal disease) ([place]) 01/19/2016  . GI bleed 10/25/2015  . Hypertensive heart disease 10/24/2015  . Chronic diastolic CHF (congestive heart failure) ([place]) 10/24/2015  . Unstable angina ([place]) 10/23/2015  . Hypotension 09/02/2015  . Chronic systolic CHF (congestive heart failure) ([place]) 09/02/2015  . Depression 07/27/2015  . Calculus of gallbladder with chronic cholecystitis without obstruction   . Bilateral carotid bruits 11/30/2014  . Low magnesium levels 08/10/2013  . Anemia 08/10/2013  . Constipation 08/10/2013  . Postoperative atrial fibrillation ([place]) 07/30/2013  . Coronary artery disease   . CAD (coronary artery disease) 07/02/2013  . Acute systolic heart failure ([place]) 06/30/2013  . NSTEMI (non-ST elevated myocardial infarction) ([place]) 06/29/2013  . Vertigo 08/25/2012  . Sleep disorder 05/23/2012  . Hypothyroid 04/27/2012  . HTN (hypertension) 04/27/2012  . HLD (hyperlipidemia) 04/27/2012  . Diabetes mellitus, type 2 ([place]) 04/27/2012  .  Neuropathy 04/27/2012   PCP:  [name], MD Pharmacy:   [facility] [address], [place] - [place] [address] [place] [address] [place] [address] Phone: [phone] Fax: [phone]  [facility] [place], [place] - [address] [place] [address] Phone: [phone] Fax: [phone]     Social Determinants of Health (SDOH) Interventions    Readmission Risk Interventions Readmission Risk Prevention Plan 02/13/2019 09/15/2018  Transportation Screening - Complete  Medication Review ([place]) Complete Complete  PCP or Specialist appointment within 3-5 days of discharge - Complete  HRI or [place] Complete Complete  SW Recovery Care/Counseling Consult Complete Complete  [place] Not Applicable Complete  Some recent data might be hidden

## 2019-02-13 NOTE — Progress Notes (Signed)
PROGRESS NOTE    Jamie Arias  [id number] DOB: 06/22/1955 DOA: 02/09/2019 PCP: [name], MD    Brief Narrative:  63 year old female with end-stage renal disease on hemodialysis, admitted to the hospital with severe septic shock secondary to MRSA bacteremia. She had associated metabolic encephalopathy. She is being treated with intravenous vancomycin. She is not requiring any further pressors. Encephalopathy has been slow to improve.   Assessment & Plan:   Principal Problem:   Sepsis due to methicillin resistant Staphylococcus aureus (MRSA) ([place]) Active Problems:   Diabetes mellitus, type 2 (HCC)   Hypotension   Chronic diastolic CHF (congestive heart failure) (HCC)   ESRD (end stage renal disease) (HCC)   Altered level of consciousness   Pressure injury of skin   MRSA bacteremia   Arm DVT (deep venous thromboembolism), acute, left (HCC)   Acute encephalopathy   1. Septic shock secondary to MRSA bacteremia. Currently on vancomycin. Infectious disease following. 2D echocardiogram does not show any evidence of vegetations. TEE has been requested, discussed with cardiology. Overall hemodynamics are improving. She is not requiring any vasopressor support. Etiology of bacteremia is not entirely clear. 2. End-stage renal disease on hemodialysis. Nephrology following for dialysis needs. 3. Acute encephalopathy. Suspect this is related to sepsis. Overall her mental status appears to be slowly improving. MRI of the brain did not show any acute infarct. EEG did not show any evidence of seizures. She was noted to have twitching in her lower jaw, but her husband reports this is been present for the past several months. 4. Acute left arm DVT. Noted on venous Dopplers. Started on heparin infusion. 5. History of atrial fibrillation. Taking amiodarone and Eliquis prior to admission. Currently in sinus rhythm. Continue to follow heart rate. She will be anticoagulated for her venous  thromboembolism. 6. Chronic diastolic congestive heart failure. Appears compensated at this time. 7. Diabetes. Continue on sliding scale insulin. Blood sugars are currently stable.   DVT prophylaxis: Heparin infusion Code Status: Full code Family Communication: Discussed with husband at the bedside Disposition Plan: May need placement pending hospital course   Consultants:   ID  Nephrology  Procedures:   Right femoral CVC 12/8>  Right femoral art line 12/8>  Antimicrobials:   Vancomycin    Subjective: Patient answer simple questions. She does make eye contact. Cannot elaborate on her answers.  Objective: Vitals:   02/13/19 1500 02/13/19 1600 02/13/19 1700 02/13/19 1800  BP: (!) 151/53 (!) 137/50 (!) 137/51 (!) 129/56  Pulse: 68 65 66 66  Resp: (!) 24 18 14  (!) 37  Temp:      TempSrc:      SpO2: 100% 100% 100% 100%  Weight:      Height:        Intake/Output Summary (Last 24 hours) at 02/13/2019 2111 Last data filed at 02/13/2019 1501 Gross per 24 hour  Intake 0 ml  Output --  Net 0 ml   Filed Weights   02/10/19 1700 02/12/19 1130 02/12/19 1445  Weight: 78.1 kg 78 kg 78.2 kg    Examination:  General exam: Appears calm and comfortable  Respiratory system: Clear to auscultation. Respiratory effort normal. Cardiovascular system: S1 & S2 heard, RRR. No JVD, murmurs, rubs, gallops or clicks.  Gastrointestinal system: Abdomen is nondistended, soft and nontender. No organomegaly or masses felt. Normal bowel sounds heard. Central nervous system: Limited exam due to mental status. No focal neurological deficits. Twitching noted in lower jaw Extremities: No edema bilaterally Skin: No  rashes, lesions or ulcers Psychiatry: Unable to assess due to mental status    Data Reviewed: I have personally reviewed following labs and imaging studies  CBC: Recent Labs  Lab 02/09/19 1143 02/10/19 0500 02/11/19 0825 02/12/19 0444 02/13/19 0453  WBC 15.8* 6.7 14.4*  8.2 6.9  NEUTROABS 14.9*  --   --   --   --   HGB 10.7* 9.0* 8.8* 7.8* 7.9*  HCT 34.1* 26.6* 26.5* 24.4* 24.7*  MCV 85.0 81.6 81.5 84.4 86.1  PLT 215 172 140* 131* [id number]*   Basic Metabolic Panel: Recent Labs  Lab 02/09/19 1143 02/10/19 0500 02/11/19 0825 02/12/19 0444 02/13/19 0453  NA 140 140 142 145 143  K 3.7 3.0* 2.9* 2.6* 3.2*  CL 99 99 101 103 103  CO2 23 27 28 30 27   GLUCOSE 101* 88 173* 118* 108*  BUN 16 17 12 16 17   CREATININE 5.04* 4.12* 3.01* 3.74* 3.31*  CALCIUM 8.8* 7.9* 7.8* 8.2* 8.4*  MG  --   --  1.7 1.7 2.3  PHOS  --   --   --  2.0* 1.7*   GFR: Estimated Creatinine Clearance: 17.1 mL/min (A) (by C-G formula based on SCr of 3.31 mg/dL (H)). Liver Function Tests: Recent Labs  Lab 02/09/19 1143 02/10/19 0500 02/12/19 0444 02/13/19 0453  AST 92* 70*  --   --   ALT 40 35  --   --   ALKPHOS 105 92  --   --   BILITOT 1.3* 1.2  --   --   PROT 6.1* 5.2*  --   --   ALBUMIN 2.1* 1.7* 1.9* 2.2*   Recent Labs  Lab 02/09/19 1315  LIPASE 16   Recent Labs  Lab 02/09/19 1836  AMMONIA <9*   Coagulation Profile: Recent Labs  Lab 02/09/19 1143 02/10/19 0500  INR 1.1 1.1   Cardiac Enzymes: No results for input(s): CKTOTAL, CKMB, CKMBINDEX, TROPONINI in the last 168 hours. BNP (last 3 results) No results for input(s): PROBNP in the last 8760 hours. HbA1C: No results for input(s): HGBA1C in the last 72 hours. CBG: Recent Labs  Lab 02/12/19 1653 02/12/19 2238 02/13/19 0755 02/13/19 1157 02/13/19 1549  GLUCAP 93 104* 96 92 94   Lipid Profile: No results for input(s): CHOL, HDL, LDLCALC, TRIG, CHOLHDL, LDLDIRECT in the last 72 hours. Thyroid Function Tests: No results for input(s): TSH, T4TOTAL, FREET4, T3FREE, THYROIDAB in the last 72 hours. Anemia Panel: No results for input(s): VITAMINB12, FOLATE, FERRITIN, TIBC, IRON, RETICCTPCT in the last 72 hours. Sepsis Labs: Recent Labs  Lab 02/09/19 1438 02/09/19 1836 02/10/19 2018  LATICACIDVEN  2.6* 1.4 1.0    Recent Results (from the past 240 hour(s))  Culture, blood (routine x 2)     Status: Abnormal   Collection Time: 02/09/19  2:29 PM   Specimen: BLOOD LEFT ARM  Result Value Ref Range Status   Specimen Description   Final    BLOOD LEFT ARM Performed at [facility], [address], [place], [place] [address]    Special Requests   Final    BOTTLES DRAWN AEROBIC AND ANAEROBIC Blood Culture adequate volume Performed at [facility], [address], [place], [place] [address]    Culture  Setup Time   Final    GRAM POSITIVE COCCI IN BOTH AEROBIC AND ANAEROBIC BOTTLES CRITICAL VALUE NOTED.  VALUE IS CONSISTENT WITH PREVIOUSLY REPORTED AND CALLED VALUE. Performed at [facility], [place], [place]  [address]    Culture (A)  Final    STAPHYLOCOCCUS AUREUS SUSCEPTIBILITIES PERFORMED ON PREVIOUS CULTURE WITHIN THE LAST 5 DAYS. Performed at [facility], [place] [address] [place], [place] [address]    Report Status 02/12/2019 FINAL  Final  Culture, blood (routine x 2)     Status: Abnormal   Collection Time: 02/09/19  2:38 PM   Specimen: BLOOD LEFT ARM  Result Value Ref Range Status   Specimen Description   Final    BLOOD LEFT ARM Performed at [facility], [address], [place], [place] [address]    Special Requests   Final    BOTTLES DRAWN AEROBIC AND ANAEROBIC Blood Culture adequate volume Performed at [facility], [address] [place], [place] [address]    Culture  Setup Time   Final    GRAM POSITIVE COCCI IN BOTH AEROBIC AND ANAEROBIC BOTTLES CRITICAL RESULT CALLED TO, READ BACK BY AND VERIFIED WITH: [place] [id number] 02/10/2019 HNM Performed at [facility], [place] [address], [place], [place] [address]    Culture METHICILLIN RESISTANT STAPHYLOCOCCUS AUREUS (A)  Final   Report Status 02/12/2019 FINAL  Final   Organism ID, Bacteria METHICILLIN RESISTANT STAPHYLOCOCCUS AUREUS  Final        Susceptibility   Methicillin resistant staphylococcus aureus - MIC*    CIPROFLOXACIN >=8 RESISTANT Resistant     ERYTHROMYCIN >=8 RESISTANT Resistant     GENTAMICIN <=0.5 SENSITIVE Sensitive     OXACILLIN >=4 RESISTANT Resistant     TETRACYCLINE <=1 SENSITIVE Sensitive     VANCOMYCIN 1 SENSITIVE Sensitive     TRIMETH/SULFA <=10 SENSITIVE Sensitive     CLINDAMYCIN RESISTANT Resistant     RIFAMPIN <=0.5 SENSITIVE Sensitive     Inducible Clindamycin POSITIVE Resistant     * METHICILLIN RESISTANT STAPHYLOCOCCUS AUREUS  Blood Culture ID Panel (Reflexed)     Status: Abnormal   Collection Time: 02/09/19  2:38 PM  Result Value Ref Range Status   Enterococcus species NOT DETECTED NOT DETECTED Final   Listeria monocytogenes NOT DETECTED NOT DETECTED Final   Staphylococcus species DETECTED (A) NOT DETECTED Final    Comment: CRITICAL RESULT CALLED TO, READ BACK BY AND VERIFIED WITH: Hart Robinsons PHARMD [id number] 02/10/2019 HNM    Staphylococcus aureus (BCID) DETECTED (A) NOT DETECTED Final    Comment: Methicillin (oxacillin)-resistant Staphylococcus aureus (MRSA). MRSA is predictably resistant to beta-lactam antibiotics (except ceftaroline). Preferred therapy is vancomycin unless clinically contraindicated. Patient requires contact precautions if  hospitalized. CRITICAL RESULT CALLED TO, READ BACK BY AND VERIFIED WITH: Hart Robinsons PHARMD [id number] 02/10/2019 HNM    Methicillin resistance DETECTED (A) NOT DETECTED Final    Comment: CRITICAL RESULT CALLED TO, READ BACK BY AND VERIFIED WITH: Hart Robinsons PHARMD [id number] 02/10/2019 HNM    Streptococcus species NOT DETECTED NOT DETECTED Final   Streptococcus agalactiae NOT DETECTED NOT DETECTED Final   Streptococcus pneumoniae NOT DETECTED NOT DETECTED Final   Streptococcus pyogenes NOT DETECTED NOT DETECTED Final   Acinetobacter baumannii NOT DETECTED NOT DETECTED Final   Enterobacteriaceae species NOT DETECTED NOT DETECTED Final   Enterobacter cloacae complex  NOT DETECTED NOT DETECTED Final   Escherichia coli NOT DETECTED NOT DETECTED Final   Klebsiella oxytoca NOT DETECTED NOT DETECTED Final   Klebsiella pneumoniae NOT DETECTED NOT DETECTED Final   Proteus species NOT DETECTED NOT DETECTED Final   Serratia marcescens NOT DETECTED NOT DETECTED Final   Haemophilus influenzae NOT DETECTED NOT DETECTED Final  Neisseria meningitidis NOT DETECTED NOT DETECTED Final   Pseudomonas aeruginosa NOT DETECTED NOT DETECTED Final   Candida albicans NOT DETECTED NOT DETECTED Final   Candida glabrata NOT DETECTED NOT DETECTED Final   Candida krusei NOT DETECTED NOT DETECTED Final   Candida parapsilosis NOT DETECTED NOT DETECTED Final   Candida tropicalis NOT DETECTED NOT DETECTED Final    Comment: Performed at [facility], [place], [place] [address]  SARS CORONAVIRUS 2 (TAT 6-24 HRS) Nasopharyngeal Nasopharyngeal Swab     Status: None   Collection Time: 02/09/19  2:54 PM   Specimen: Nasopharyngeal Swab  Result Value Ref Range Status   SARS Coronavirus 2 NEGATIVE NEGATIVE Final    Comment: (NOTE) SARS-CoV-2 target nucleic acids are NOT DETECTED. The SARS-CoV-2 RNA is generally detectable in upper and lower respiratory specimens during the acute phase of infection. Negative results do not preclude SARS-CoV-2 infection, do not rule out co-infections with other pathogens, and should not be used as the sole basis for treatment or other patient management decisions. Negative results must be combined with clinical observations, patient history, and epidemiological information. The expected result is Negative. Fact Sheet for Patients: SugarRoll.be Fact Sheet for Healthcare Providers: https://www.woods-mathews.com/ This test is not yet approved or cleared by the [place] FDA and  has been authorized for detection and/or diagnosis of SARS-CoV-2 by FDA under an Emergency Use Authorization  (EUA). This EUA will remain  in effect (meaning this test can be used) for the duration of the COVID-19 declaration under Section 56 4(b)(1) of the Act, 21 U.S.C. section 360bbb-3(b)(1), unless the authorization is terminated or revoked sooner. Performed at [facility], [place] [address], [place], [place] [address]   MRSA PCR Screening     Status: Abnormal   Collection Time: 02/10/19  5:03 PM   Specimen: Nasopharyngeal  Result Value Ref Range Status   MRSA by PCR POSITIVE (A) NEGATIVE Final    Comment:        The GeneXpert MRSA Assay (FDA approved for NASAL specimens only), is one component of a comprehensive MRSA colonization surveillance program. It is not intended to diagnose MRSA infection nor to guide or monitor treatment for MRSA infections. RESULT CALLED TO, READ BACK BY AND VERIFIED WITH: ALEKSEY FRASER @1839  02/10/19 MJU Performed at [facility], [address] [place], [place] [address]   Culture, blood (routine x 2)     Status: Abnormal   Collection Time: 02/11/19 12:31 PM   Specimen: BLOOD  Result Value Ref Range Status   Specimen Description   Final    BLOOD PORTA CATH Performed at [place] [address] [place], [place] [address]    Special Requests   Final    BOTTLES DRAWN AEROBIC AND ANAEROBIC Blood Culture adequate volume Performed at [facility], [address] [place], [place] [address]    Culture  Setup Time   Final    GRAM POSITIVE COCCI ANAEROBIC BOTTLE ONLY CRITICAL RESULT CALLED TO, READ BACK BY AND VERIFIED WITH: SCOTT HALL AT [id number] 02/12/2019 [facility]  Performed at [facility], [address] [place], [place] [address]    Culture (A)  Final    STAPHYLOCOCCUS AUREUS SUSCEPTIBILITIES PERFORMED ON PREVIOUS CULTURE WITHIN THE LAST 5 DAYS. Performed at [facility], [place] [address] [place], [place] [address]    Report Status 02/13/2019 FINAL  Final  Culture, blood (routine x 2)     Status:  None (  Preliminary result)   Collection Time: 02/11/19  3:57 PM   Specimen: BLOOD  Result Value Ref Range Status   Specimen Description BLOOD PORTA CATH  Final   Special Requests   Final    BOTTLES DRAWN AEROBIC AND ANAEROBIC Blood Culture adequate volume   Culture   Final    NO GROWTH 2 DAYS Performed at [facility], [address] [place],  [address]    Report Status PENDING  Incomplete         Radiology Studies: MR BRAIN WO CONTRAST  Result Date: 02/12/2019 CLINICAL DATA:  Encephalopathy and ataxia EXAM: MRI HEAD WITHOUT CONTRAST TECHNIQUE: Multiplanar, multiecho pulse sequences of the brain and surrounding structures were obtained without intravenous contrast. COMPARISON:  Brain MRI 10/10/2017 FINDINGS: BRAIN: There is no acute infarct, acute hemorrhage or extra-axial collection. Multifocal white matter hyperintensity, most commonly due to chronic ischemic microangiopathy. The cerebral and cerebellar volume are age-appropriate. There is no hydrocephalus. The midline structures are normal. VASCULAR: The major intracranial arterial and venous sinus flow voids are normal. Susceptibility-sensitive sequences show no chronic microhemorrhage or superficial siderosis. SKULL AND UPPER CERVICAL SPINE: Calvarial bone marrow signal is normal. There is no skull base mass. The visualized upper cervical spine and soft tissues are normal. SINUSES/ORBITS: There are no fluid levels or advanced mucosal thickening. The mastoid air cells and middle ear cavities are free of fluid. The orbits are normal. IMPRESSION: 1. No acute intracranial abnormality. 2. Mild chronic small vessel disease. Electronically Signed   By: Ulyses Jarred M.D.   On: 02/12/2019 22:17        Scheduled Meds: . aspirin  81 mg Per Tube Daily  . atorvastatin  40 mg Per Tube q1800  . chlorhexidine  15 mL Mouth Rinse BID  . Chlorhexidine Gluconate Cloth  6 each Topical Q0600  . Chlorhexidine Gluconate Cloth  6 each  Topical Q0600  . [START ON 02/14/2019] epoetin (EPOGEN/PROCRIT) injection  4,000 Units Intravenous Once  . FLUoxetine  20 mg Per Tube Daily  . fluticasone  2 spray Each Nare Daily  . insulin aspart  0-9 Units Subcutaneous TID WC  . levothyroxine  88 mcg Per Tube QAC breakfast  . mouth rinse  15 mL Mouth Rinse q12n4p  . midodrine  10 mg Per Tube TID WC  . multivitamin  15 mL Per Tube Daily  . mupirocin ointment  1 application Nasal BID  . pantoprazole (PROTONIX) IV  40 mg Intravenous Q24H  . sodium chloride flush  3 mL Intravenous Once  . venlafaxine  75 mg Per Tube BID WC  . zinc sulfate  220 mg Per Tube Daily   Continuous Infusions: . sodium chloride 250 mL (02/10/19 0054)  . albumin human 25 g (02/13/19 1501)  . heparin 1,200 Units/hr (02/13/19 1325)  . norepinephrine (LEVOPHED) Adult infusion Stopped (02/12/19 0450)  . potassium PHOSPHATE IVPB (in mmol) 10 mmol (02/13/19 1810)  . [START ON 02/14/2019] vancomycin       LOS: 4 days    Time spent: 36mins    Kathie Dike, MD [facility]   If 7PM-7AM, please contact night-coverage [email or web address]  02/13/2019, 9:11 PM

## 2019-02-13 NOTE — Consult Note (Addendum)
ANTICOAGULATION CONSULT NOTE  Pharmacy Consult for heparin drip  Indication: DVT   Patient Measurements: Height: 5\' 4"  (162.6 cm) Weight: 172 lb 6.4 oz (78.2 kg) IBW/kg (Calculated) : 54.7 Heparin Dosing Weight: 71 kg  Vital Signs: Temp: 98.2 F (36.8 C) (12/11 0800) Temp Source: Axillary (12/11 0800) BP: 144/54 (12/11 1300) Pulse Rate: 66 (12/11 1300)  Labs: Recent Labs    02/11/19 0825 02/12/19 0444 02/13/19 0453  HGB 8.8* 7.8* 7.9*  HCT 26.5* 24.4* 24.7*  PLT 140* 131* 107*  CREATININE 3.01* 3.74* 3.31*    Estimated Creatinine Clearance: 17.1 mL/min (A) (by C-G formula based on SCr of 3.31 mg/dL (H)).   Medical History: Past Medical History:  Diagnosis Date  . Anemia   . Chronic diastolic CHF (congestive heart failure) ([place])    a. Due to ischemic cardiomyopathy. EF as low as 35%, improved to normal s/p CABG; b. echo 07/06/13: EF 55-60%, no RWMAs, mod TR, trivial pericardial effusion not c/w tamponade physiology;  c. 10/2015 Echo: EF 65%, Gr1 DD, triv AI, mild MR, mildly dil LA, mod TR, PASP 1mmHg.  Marland Kitchen Coronary artery disease    a. NSTEMI 06/2013; b.cath: severe three-vessel CAD w/ EF 30% & mild-mod MR; c. s/p 3 vessel CABG 07/02/13 (LIMA-LAD, SVG-OM, and SVG-RPDA);  d. 10/2015 MV: no ischemia/infarct.  . Diabetes mellitus without complication ([place])   . Diabetic neuropathy ([place])   . Dialysis patient [facility])    MWF  . ESRD (end stage renal disease) ([place])    a. 12/2015 initiated - mwf dialysis.  Marland Kitchen GERD (gastroesophageal reflux disease)   . Hyperlipidemia   . Hypertension   . Hypothyroidism   . Myocardial infarction ([place]) 2015  . Neuropathy   . Pleural effusion 2015  . Pulmonary hypertension ([place])   . Wears dentures    full lower    Medications:  Scheduled:  . aspirin  81 mg Per Tube Daily  . atorvastatin  40 mg Per Tube q1800  . chlorhexidine  15 mL Mouth Rinse BID  . Chlorhexidine Gluconate Cloth  6 each Topical Q0600  . Chlorhexidine Gluconate Cloth  6 each  Topical Q0600  . FLUoxetine  20 mg Per Tube Daily  . fluticasone  2 spray Each Nare Daily  . heparin  4,500 Units Intravenous Once  . insulin aspart  0-9 Units Subcutaneous TID WC  . levothyroxine  88 mcg Per Tube QAC breakfast  . mouth rinse  15 mL Mouth Rinse q12n4p  . midodrine  10 mg Per Tube TID WC  . multivitamin  15 mL Per Tube Daily  . mupirocin ointment  1 application Nasal BID  . pantoprazole (PROTONIX) IV  40 mg Intravenous Q24H  . potassium & sodium phosphates  1 packet Per Tube Once  . sodium chloride flush  3 mL Intravenous Once  . venlafaxine  75 mg Per Tube BID WC  . zinc sulfate  220 mg Per Tube Daily    Assessment: 63 y.o. female admitted on 02/09/2019 with MRSA  bacteremia. She was on apixaban PTA but last dose was prior to admission (12/7). US reveals age-indeterminate short segment occlusive DVT involving the mid humeral aspect of one of the paired brachial veins. Baseline INR 1.1, H&H, PLT trending down, aPTT and heparin level ordered but not yet resulted.  Goal of Therapy:  Heparin level 0.3-0.7 units/ml aPTT 66 - 102s Monitor platelets by anticoagulation protocol: Yes   Plan:   Give 4500 units bolus x 1  Start heparin  infusion at 1200 units/hr  Given unclear baseline HL, check anti-Xa and aPTT level in 8 hours and daily while on heparin Continue to monitor H&H and platelets  Dallie Piles 02/13/2019,1:22 PM

## 2019-02-13 NOTE — Progress Notes (Signed)
SLP Cancellation Note  Patient Details Name: Jamie Arias MRN: [id number] DOB: 01/10/1956   Cancelled treatment:       Reason Eval/Treat Not Completed: Medical issues which prohibited therapy;Patient not medically ready(chart reviewed; consulted NSG re: pt's status this AM). Pt remains lethargic w/ poor alertness for safe participation in BSE or any oral intake. Recommend continue w/ frequent oral care for hygiene and stimulation of swallowing; aspiration precautions. NSG agreed. ST will f/u w/ pt's status for appropriate time for assessment.      Orinda Kenner, MS, CCC-SLP Naomee Now[place] 02/13/2019, 10:13 AM

## 2019-02-13 NOTE — Telephone Encounter (Signed)
Copied from [place] [phone]. Topic: General - Inquiry >> Feb 13, 2019 11:45 AM Richardo Priest, NT wrote: Reason for CRM: Misty called in to notify PCP that pt missed a visit 12/5 as she did refuse home health care. Call back is [phone].

## 2019-02-14 LAB — BLOOD CULTURE ID PANEL (REFLEXED)

## 2019-02-14 LAB — RENAL FUNCTION PANEL
Albumin: 2.2 g/dL — ABNORMAL LOW (ref 3.5–5.0)
Anion gap: 12 (ref 5–15)
BUN: 23 mg/dL (ref 8–23)
CO2: 26 mmol/L (ref 22–32)
Calcium: 8 mg/dL — ABNORMAL LOW (ref 8.9–10.3)
Chloride: 104 mmol/L (ref 98–111)
Creatinine, Ser: 4.03 mg/dL — ABNORMAL HIGH (ref 0.44–1.00)
GFR calc Af Amer: 13 mL/min — ABNORMAL LOW (ref >60)
GFR calc non Af Amer: 11 mL/min — ABNORMAL LOW (ref >60)
Glucose, Bld: 109 mg/dL — ABNORMAL HIGH (ref 70–99)
Phosphorus: 2.2 mg/dL — ABNORMAL LOW (ref 2.5–4.6)
Potassium: 3.1 mmol/L — ABNORMAL LOW (ref 3.5–5.1)
Sodium: 142 mmol/L (ref 135–145)

## 2019-02-14 LAB — CBC
HCT: 22.2 % — ABNORMAL LOW (ref 36.0–46.0)
Hemoglobin: 7.2 g/dL — ABNORMAL LOW (ref 12.0–15.0)
MCH: 27.2 pg (ref 26.0–34.0)
MCHC: 32.4 g/dL (ref 30.0–36.0)
MCV: 83.8 fL (ref 80.0–100.0)
Platelets: 106 10*3/uL — ABNORMAL LOW (ref 150–400)
RBC: 2.65 MIL/uL — ABNORMAL LOW (ref 3.87–5.11)
RDW: 19 % — ABNORMAL HIGH (ref 11.5–15.5)
WBC: 8 10*3/uL (ref 4.0–10.5)
nRBC: 0 % (ref 0.0–0.2)

## 2019-02-14 LAB — GLUCOSE, CAPILLARY
Glucose-Capillary: 100 mg/dL — ABNORMAL HIGH (ref 70–99)
Glucose-Capillary: 104 mg/dL — ABNORMAL HIGH (ref 70–99)
Glucose-Capillary: 125 mg/dL — ABNORMAL HIGH (ref 70–99)
Glucose-Capillary: 120 mg/dL — ABNORMAL HIGH (ref 70–99)

## 2019-02-14 LAB — MAGNESIUM: Magnesium: 2.2 mg/dL (ref 1.7–2.4)

## 2019-02-14 LAB — APTT
aPTT: >160 seconds (ref 24–36)
aPTT: >160 seconds (ref 24–36)

## 2019-02-14 MED ORDER — HEPARIN (PORCINE) 25000 UT/250ML-% IV SOLN: 800.0000 [IU]/h | INTRAVENOUS | Status: DC

## 2019-02-14 MED ORDER — POTASSIUM CHLORIDE 10 MEQ/100ML IV SOLN
10.0000 meq | INTRAVENOUS | Status: DC
  Filled 2019-02-14 (×4): qty 100

## 2019-02-14 MED ORDER — K PHOS MONO-SOD PHOS DI & MONO 155-852-130 MG PO TABS
500.0000 mg | ORAL_TABLET | Freq: Once | ORAL | Status: AC
  Administered 2019-02-14: 22:00:00 500 mg via ORAL
  Filled 2019-02-14: qty 2

## 2019-02-14 MED ORDER — K PHOS MONO-SOD PHOS DI & MONO 155-852-130 MG PO TABS
500.0000 mg | ORAL_TABLET | Freq: Once | ORAL | Status: DC
  Filled 2019-02-14: qty 2

## 2019-02-14 MED ORDER — POTASSIUM CHLORIDE 10 MEQ/100ML IV SOLN
10.0000 meq | INTRAVENOUS | Status: AC
  Administered 2019-02-14 – 2019-02-15 (×4): 10 meq via INTRAVENOUS
  Filled 2019-02-14 (×4): qty 100

## 2019-02-14 MED ORDER — HEPARIN (PORCINE) 25000 UT/250ML-% IV SOLN
1000.0000 [IU]/h | INTRAVENOUS | Status: DC
  Administered 2019-02-14: 12:00:00 1000 [IU]/h via INTRAVENOUS

## 2019-02-14 NOTE — Consult Note (Signed)
ANTICOAGULATION CONSULT NOTE  Pharmacy Consult for heparin drip  Indication: DVT   Patient Measurements: Height: 5\' 4"  (162.6 cm) Weight: 172 lb 6.4 oz (78.2 kg) IBW/kg (Calculated) : 54.7 Heparin Dosing Weight: 71 kg  Vital Signs: Temp: 98.4 F (36.9 C) (12/12 0730) Temp Source: Axillary (12/12 0730) BP: 145/56 (12/12 1100) Pulse Rate: 65 (12/12 1100)  Labs: Recent Labs    02/12/19 0444 02/13/19 0453 02/13/19 1317 02/13/19 2300 02/14/19 0340  HGB 7.8* 7.9*  --   --  7.2*  HCT 24.4* 24.7*  --   --  22.2*  PLT 131* 107*  --   --  106*  APTT  --   --  >160* >160*  --   HEPARINUNFRC  --   --  1.27* 1.23*  --   CREATININE 3.74* 3.31*  --   --  4.03*    Estimated Creatinine Clearance: 14.1 mL/min (A) (by C-G formula based on SCr of 4.03 mg/dL (H)).   Medical History: Past Medical History:  Diagnosis Date  . Anemia   . Chronic diastolic CHF (congestive heart failure) ([place])    a. Due to ischemic cardiomyopathy. EF as low as 35%, improved to normal s/p CABG; b. echo 07/06/13: EF 55-60%, no RWMAs, mod TR, trivial pericardial effusion not c/w tamponade physiology;  c. 10/2015 Echo: EF 65%, Gr1 DD, triv AI, mild MR, mildly dil LA, mod TR, PASP 71mmHg.  Marland Kitchen Coronary artery disease    a. NSTEMI 06/2013; b.cath: severe three-vessel CAD w/ EF 30% & mild-mod MR; c. s/p 3 vessel CABG 07/02/13 (LIMA-LAD, SVG-OM, and SVG-RPDA);  d. 10/2015 MV: no ischemia/infarct.  . Diabetes mellitus without complication ([place])   . Diabetic neuropathy ([place])   . Dialysis patient [facility])    MWF  . ESRD (end stage renal disease) ([place])    a. 12/2015 initiated - [facility] dialysis.  Marland Kitchen GERD (gastroesophageal reflux disease)   . Hyperlipidemia   . Hypertension   . Hypothyroidism   . Myocardial infarction ([place]) 2015  . Neuropathy   . Pleural effusion 2015  . Pulmonary hypertension ([place])   . Wears dentures    full lower    Medications:  Scheduled:  . aspirin  81 mg Per Tube Daily  . atorvastatin  40 mg Per  Tube q1800  . chlorhexidine  15 mL Mouth Rinse BID  . Chlorhexidine Gluconate Cloth  6 each Topical Q0600  . Chlorhexidine Gluconate Cloth  6 each Topical Q0600  . epoetin (EPOGEN/PROCRIT) injection  4,000 Units Intravenous Once  . FLUoxetine  20 mg Per Tube Daily  . fluticasone  2 spray Each Nare Daily  . insulin aspart  0-9 Units Subcutaneous TID WC  . levothyroxine  88 mcg Per Tube QAC breakfast  . mouth rinse  15 mL Mouth Rinse q12n4p  . midodrine  10 mg Per Tube TID WC  . multivitamin  15 mL Per Tube Daily  . mupirocin ointment  1 application Nasal BID  . pantoprazole (PROTONIX) IV  40 mg Intravenous Q24H  . sodium chloride flush  3 mL Intravenous Once  . venlafaxine  75 mg Per Tube BID WC  . zinc sulfate  220 mg Per Tube Daily    Assessment: 63 y.o. female admitted on 02/09/2019 with MRSA  bacteremia. She was on apixaban PTA but last dose was prior to admission (12/7). US reveals age-indeterminate short segment occlusive DVT involving the mid humeral aspect of one of the paired brachial veins. Baseline INR 1.1, H&H, PLT  trending down.  Hemoglobin is 1.2 today.  Baseline APTT >160, heparin level 1.27.   12/12.  Nurse reported high APTT of 1.23, and shut off heparin at 12/12 @ 0900.    Goal of Therapy:  Heparin level 0.3-0.7 units/ml aPTT 66 - 102s Monitor platelets by anticoagulation protocol: Yes   Plan:   Restart infusion for 1000 units per hour.  Recheck APTT in 8 hours, recheck HL daily while on heparin Continue to monitor H&H and platelets  Gerald Dexter 02/14/2019,11:47 AM

## 2019-02-14 NOTE — Progress Notes (Signed)
Follow up - Critical Care Medicine Note  Patient Details:    Jamie Arias is an 63 y.o. female with end-stage renal disease with morbid obesity admitted to the stepdown unit ICU floor for severe septic shock secondary to MRSA bacteremia most likely source is skin infection complicated by metabolic encephalopathy  [id number]- for TEE  12/12 - repeated blood cultures with MRSA+, HD unable to cannulate fistula.   Lines, Airways, Drains: CVC Triple Lumen 02/10/19 Right Femoral (Active)  Indication for Insertion or Continuance of Line Vasoactive infusions 02/11/19 0821  Site Assessment Clean;Dry;Intact 02/11/19 0821  Proximal Lumen Status Saline locked;Blood return noted 02/11/19 0821  Medial Lumen Status Infusing;Blood return noted 02/11/19 [id number]  Distal Lumen Status Saline locked;Flushed;Blood return noted 02/11/19 0821  Dressing Type Transparent 02/11/19 0821  Dressing Status Clean;Dry;Intact;Antimicrobial disc in place 02/11/19 [place] checked and tightened 02/11/19 [id number]  Dressing Intervention Dressing changed;Antimicrobial disc changed 02/10/19 2000  Dressing Change Due 02/17/19 02/11/19 [id number]     Arterial Line 02/10/19 Right Femoral (Active)  Site Assessment Clean;Dry;Intact 02/11/19 0821  Line Status Pulsatile blood flow 02/11/19 0821  Art Line Waveform Appropriate 02/11/19 0821  Art Line Interventions Connections checked and tightened;Zeroed and calibrated 02/11/19 0821  Color/Movement/Sensation Capillary refill less than 3 sec 02/11/19 0000  Dressing Type Transparent 02/11/19 0821  Dressing Status Clean;Dry;Intact 02/11/19 0821  Interventions Dressing changed;Antimicrobial disc changed 02/10/19 2000  Dressing Change Due 02/17/19 02/11/19 [id number]    Anti-infectives:  Anti-infectives (From admission, onward)   Start     Dose/Rate Route Frequency Ordered Stop   02/14/19 1200  vancomycin (VANCOCIN) IVPB 1000 mg/200 mL premix     1,000 mg 200 mL/hr over 60 Minutes  Intravenous  Once 02/13/19 1058 02/14/19 1548   02/12/19 1400  vancomycin (VANCOCIN) IVPB 1000 mg/200 mL premix     1,000 mg 200 mL/hr over 60 Minutes Intravenous  Once 02/11/19 1204 02/12/19 1400   02/11/19 1200  vancomycin (VANCOCIN) 500 mg in sodium chloride 0.9 % 100 mL IVPB  Status:  Discontinued     500 mg 100 mL/hr over 60 Minutes Intravenous Every M-W-F (Hemodialysis) 02/09/19 1726 02/10/19 0906   02/11/19 1200  vancomycin (VANCOCIN) IVPB 1000 mg/200 mL premix  Status:  Discontinued     1,000 mg 200 mL/hr over 60 Minutes Intravenous Every M-W-F (Hemodialysis) 02/10/19 0906 02/11/19 1204   02/10/19 1200  vancomycin (VANCOCIN) IVPB 1000 mg/200 mL premix  Status:  Discontinued     1,000 mg 200 mL/hr over 60 Minutes Intravenous  Once 02/10/19 0908 02/10/19 0922   02/10/19 1200  vancomycin (VANCOCIN) 1,500 mg in sodium chloride 0.9 % 500 mL IVPB  Status:  Discontinued     1,500 mg 250 mL/hr over 120 Minutes Intravenous  Once 02/10/19 0922 02/10/19 0933   02/10/19 1200  vancomycin (VANCOCIN) 1,500 mg in sodium chloride 0.9 % 500 mL IVPB     1,500 mg 250 mL/hr over 120 Minutes Intravenous  Once 02/10/19 0933 02/10/19 1232   02/09/19 2200  piperacillin-tazobactam (ZOSYN) IVPB 3.375 g  Status:  Discontinued     3.375 g 12.5 mL/hr over 240 Minutes Intravenous Every 12 hours 02/09/19 1719 02/10/19 0923   02/09/19 1718  vancomycin variable dose per unstable renal function (pharmacist dosing)  Status:  Discontinued      Does not apply See admin instructions 02/09/19 1719 02/09/19 1726   02/09/19 1500  ceFEPIme (MAXIPIME) 2 g in sodium chloride 0.9 % 100 mL IVPB  2 g 200 mL/hr over 30 Minutes Intravenous  Once 02/09/19 1453 02/09/19 1622   02/09/19 1500  metroNIDAZOLE (FLAGYL) IVPB 500 mg     500 mg 100 mL/hr over 60 Minutes Intravenous  Once 02/09/19 1453 02/09/19 1756   02/09/19 1500  vancomycin (VANCOCIN) IVPB 1000 mg/200 mL premix     1,000 mg 200 mL/hr over 60 Minutes Intravenous   Once 02/09/19 1453 02/09/19 1756      Microbiology: Results for orders placed or performed during the hospital encounter of 02/09/19  Culture, blood (routine x 2)     Status: Abnormal   Collection Time: 02/09/19  2:29 PM   Specimen: BLOOD LEFT ARM  Result Value Ref Range Status   Specimen Description   Final    BLOOD LEFT ARM Performed at [facility], [address] [place], [place] [address]    Special Requests   Final    BOTTLES DRAWN AEROBIC AND ANAEROBIC Blood Culture adequate volume Performed at [facility], [address] [place], [place] [address]    Culture  Setup Time   Final    GRAM POSITIVE COCCI IN BOTH AEROBIC AND ANAEROBIC BOTTLES CRITICAL VALUE NOTED.  VALUE IS CONSISTENT WITH PREVIOUSLY REPORTED AND CALLED VALUE. Performed at [facility], [address] [place], [place] [address]    Culture (A)  Final    STAPHYLOCOCCUS AUREUS SUSCEPTIBILITIES PERFORMED ON PREVIOUS CULTURE WITHIN THE LAST 5 DAYS. Performed at [facility], [place] [address] [place], [place] [address]    Report Status 02/12/2019 FINAL  Final  Culture, blood (routine x 2)     Status: Abnormal   Collection Time: 02/09/19  2:38 PM   Specimen: BLOOD LEFT ARM  Result Value Ref Range Status   Specimen Description   Final    BLOOD LEFT ARM Performed at [facility], [address] [place], [place] [address]    Special Requests   Final    BOTTLES DRAWN AEROBIC AND ANAEROBIC Blood Culture adequate volume Performed at [facility], [address] [place], [place] [address]    Culture  Setup Time   Final    GRAM POSITIVE COCCI IN BOTH AEROBIC AND ANAEROBIC BOTTLES CRITICAL RESULT CALLED TO, READ BACK BY AND VERIFIED WITH: [place] [id number] 02/10/2019 HNM Performed at [facility], [place] [address], [place], [place] [address]    Culture METHICILLIN RESISTANT STAPHYLOCOCCUS AUREUS (A)  Final   Report Status 02/12/2019 FINAL  Final    Organism ID, Bacteria METHICILLIN RESISTANT STAPHYLOCOCCUS AUREUS  Final      Susceptibility   Methicillin resistant staphylococcus aureus - MIC*    CIPROFLOXACIN >=8 RESISTANT Resistant     ERYTHROMYCIN >=8 RESISTANT Resistant     GENTAMICIN <=0.5 SENSITIVE Sensitive     OXACILLIN >=4 RESISTANT Resistant     TETRACYCLINE <=1 SENSITIVE Sensitive     VANCOMYCIN 1 SENSITIVE Sensitive     TRIMETH/SULFA <=10 SENSITIVE Sensitive     CLINDAMYCIN RESISTANT Resistant     RIFAMPIN <=0.5 SENSITIVE Sensitive     Inducible Clindamycin POSITIVE Resistant     * METHICILLIN RESISTANT STAPHYLOCOCCUS AUREUS  Blood Culture ID Panel (Reflexed)     Status: Abnormal   Collection Time: 02/09/19  2:38 PM  Result Value Ref Range Status   Enterococcus species NOT DETECTED NOT DETECTED Final   Listeria monocytogenes NOT DETECTED NOT DETECTED Final   Staphylococcus species DETECTED (A) NOT DETECTED Final    Comment:  CRITICAL RESULT CALLED TO, READ BACK BY AND VERIFIED WITH: Hart Robinsons PHARMD [id number] 02/10/2019 HNM    Staphylococcus aureus (BCID) DETECTED (A) NOT DETECTED Final    Comment: Methicillin (oxacillin)-resistant Staphylococcus aureus (MRSA). MRSA is predictably resistant to beta-lactam antibiotics (except ceftaroline). Preferred therapy is vancomycin unless clinically contraindicated. Patient requires contact precautions if  hospitalized. CRITICAL RESULT CALLED TO, READ BACK BY AND VERIFIED WITH: Hart Robinsons PHARMD [id number] 02/10/2019 HNM    Methicillin resistance DETECTED (A) NOT DETECTED Final    Comment: CRITICAL RESULT CALLED TO, READ BACK BY AND VERIFIED WITH: Hart Robinsons PHARMD [id number] 02/10/2019 HNM    Streptococcus species NOT DETECTED NOT DETECTED Final   Streptococcus agalactiae NOT DETECTED NOT DETECTED Final   Streptococcus pneumoniae NOT DETECTED NOT DETECTED Final   Streptococcus pyogenes NOT DETECTED NOT DETECTED Final   Acinetobacter baumannii NOT DETECTED NOT DETECTED Final    Enterobacteriaceae species NOT DETECTED NOT DETECTED Final   Enterobacter cloacae complex NOT DETECTED NOT DETECTED Final   Escherichia coli NOT DETECTED NOT DETECTED Final   Klebsiella oxytoca NOT DETECTED NOT DETECTED Final   Klebsiella pneumoniae NOT DETECTED NOT DETECTED Final   Proteus species NOT DETECTED NOT DETECTED Final   Serratia marcescens NOT DETECTED NOT DETECTED Final   Haemophilus influenzae NOT DETECTED NOT DETECTED Final   Neisseria meningitidis NOT DETECTED NOT DETECTED Final   Pseudomonas aeruginosa NOT DETECTED NOT DETECTED Final   Candida albicans NOT DETECTED NOT DETECTED Final   Candida glabrata NOT DETECTED NOT DETECTED Final   Candida krusei NOT DETECTED NOT DETECTED Final   Candida parapsilosis NOT DETECTED NOT DETECTED Final   Candida tropicalis NOT DETECTED NOT DETECTED Final    Comment: Performed at [facility], [place], [place] [address]  SARS CORONAVIRUS 2 (TAT 6-24 HRS) Nasopharyngeal Nasopharyngeal Swab     Status: None   Collection Time: 02/09/19  2:54 PM   Specimen: Nasopharyngeal Swab  Result Value Ref Range Status   SARS Coronavirus 2 NEGATIVE NEGATIVE Final    Comment: (NOTE) SARS-CoV-2 target nucleic acids are NOT DETECTED. The SARS-CoV-2 RNA is generally detectable in upper and lower respiratory specimens during the acute phase of infection. Negative results do not preclude SARS-CoV-2 infection, do not rule out co-infections with other pathogens, and should not be used as the sole basis for treatment or other patient management decisions. Negative results must be combined with clinical observations, patient history, and epidemiological information. The expected result is Negative. Fact Sheet for Patients: SugarRoll.be Fact Sheet for Healthcare Providers: https://www.woods-mathews.com/ This test is not yet approved or cleared by the [place] FDA and  has been authorized  for detection and/or diagnosis of SARS-CoV-2 by FDA under an Emergency Use Authorization (EUA). This EUA will remain  in effect (meaning this test can be used) for the duration of the COVID-19 declaration under Section 56 4(b)(1) of the Act, 21 U.S.C. section 360bbb-3(b)(1), unless the authorization is terminated or revoked sooner. Performed at [facility], [place] [address], [place], [place] [address]   MRSA PCR Screening     Status: Abnormal   Collection Time: 02/10/19  5:03 PM   Specimen: Nasopharyngeal  Result Value Ref Range Status   MRSA by PCR POSITIVE (A) NEGATIVE Final    Comment:        The GeneXpert MRSA Assay (FDA approved for NASAL specimens only), is one component of a comprehensive MRSA colonization surveillance program. It is not intended to diagnose MRSA infection nor  to guide or monitor treatment for MRSA infections. RESULT CALLED TO, READ BACK BY AND VERIFIED WITH: Britt Boozer @1839  02/10/19 MJU Performed at [facility], [address] [place], [place] [address]   Culture, blood (routine x 2)     Status: Abnormal   Collection Time: 02/11/19 12:31 PM   Specimen: BLOOD  Result Value Ref Range Status   Specimen Description   Final    BLOOD PORTA CATH Performed at [place] [address] [place], [place] [address]    Special Requests   Final    BOTTLES DRAWN AEROBIC AND ANAEROBIC Blood Culture adequate volume Performed at [facility], [address] [place], [place] [address]    Culture  Setup Time   Final    GRAM POSITIVE COCCI ANAEROBIC BOTTLE ONLY CRITICAL RESULT CALLED TO, READ BACK BY AND VERIFIED WITH: SCOTT HALL AT [id number] 02/12/2019 [facility]  Performed at [facility], [address] [place], [place] [address]    Culture (A)  Final    STAPHYLOCOCCUS AUREUS SUSCEPTIBILITIES PERFORMED ON PREVIOUS CULTURE WITHIN THE LAST 5 DAYS. Performed at [facility], [place] [address] [place], [place]  [address]    Report Status 02/13/2019 FINAL  Final  Culture, blood (routine x 2)     Status: None (Preliminary result)   Collection Time: 02/11/19  3:57 PM   Specimen: BLOOD  Result Value Ref Range Status   Specimen Description BLOOD PORTA CATH  Final   Special Requests   Final    BOTTLES DRAWN AEROBIC AND ANAEROBIC Blood Culture adequate volume   Culture   Final    NO GROWTH 3 DAYS Performed at [facility], [address], [place], [place] [address]    Report Status PENDING  Incomplete  CULTURE, BLOOD (ROUTINE X 2) w Reflex to ID Panel     Status: None (Preliminary result)   Collection Time: 02/13/19  2:18 PM   Specimen: BLOOD LEFT HAND  Result Value Ref Range Status   Specimen Description   Final    BLOOD LEFT HAND Performed at [facility], [place] [address] [place], [place] [address]    Special Requests   Final    BOTTLES DRAWN AEROBIC AND ANAEROBIC Blood Culture results may not be optimal due to an inadequate volume of blood received in culture bottles Performed at [facility] [address] [place], [place] [address]    Culture  Setup Time   Final    Organism ID to follow AEROBIC BOTTLE ONLY GRAM POSITIVE COCCI IN CLUSTERS CRITICAL RESULT CALLED TO, READ BACK BY AND VERIFIED WITH: Herbert Pun AT [id number] ON 02/14/2019 [place]. Performed at [facility], [address] [place], [place] [address]    Culture GRAM POSITIVE COCCI IN CLUSTERS  Final   Report Status PENDING  Incomplete  Blood Culture ID Panel (Reflexed)     Status: Abnormal   Collection Time: 02/13/19  2:18 PM  Result Value Ref Range Status   Enterococcus species NOT DETECTED NOT DETECTED Final   Listeria monocytogenes NOT DETECTED NOT DETECTED Final   Staphylococcus species DETECTED (A) NOT DETECTED Final    Comment: CRITICAL RESULT CALLED TO, READ BACK BY AND VERIFIED WITH: Herbert Pun AT [id number] ON 02/14/2019 [place].    Staphylococcus aureus (BCID) DETECTED (A) NOT DETECTED  Final    Comment: Methicillin (oxacillin)-resistant Staphylococcus aureus (MRSA). MRSA is predictably resistant to beta-lactam antibiotics (except ceftaroline). Preferred therapy is vancomycin  unless clinically contraindicated. Patient requires contact precautions if  hospitalized. CRITICAL RESULT CALLED TO, READ BACK BY AND VERIFIED WITH: Herbert Pun AT [id number] ON 02/14/2019 [place].    Methicillin resistance DETECTED (A) NOT DETECTED Final    Comment: CRITICAL RESULT CALLED TO, READ BACK BY AND VERIFIED WITH: Herbert Pun AT [id number] ON 02/14/2019 [place].    Streptococcus species NOT DETECTED NOT DETECTED Final   Streptococcus agalactiae NOT DETECTED NOT DETECTED Final   Streptococcus pneumoniae NOT DETECTED NOT DETECTED Final   Streptococcus pyogenes NOT DETECTED NOT DETECTED Final   Acinetobacter baumannii NOT DETECTED NOT DETECTED Final   Enterobacteriaceae species NOT DETECTED NOT DETECTED Final   Enterobacter cloacae complex NOT DETECTED NOT DETECTED Final   Escherichia coli NOT DETECTED NOT DETECTED Final   Klebsiella oxytoca NOT DETECTED NOT DETECTED Final   Klebsiella pneumoniae NOT DETECTED NOT DETECTED Final   Proteus species NOT DETECTED NOT DETECTED Final   Serratia marcescens NOT DETECTED NOT DETECTED Final   Haemophilus influenzae NOT DETECTED NOT DETECTED Final   Neisseria meningitidis NOT DETECTED NOT DETECTED Final   Pseudomonas aeruginosa NOT DETECTED NOT DETECTED Final   Candida albicans NOT DETECTED NOT DETECTED Final   Candida glabrata NOT DETECTED NOT DETECTED Final   Candida krusei NOT DETECTED NOT DETECTED Final   Candida parapsilosis NOT DETECTED NOT DETECTED Final   Candida tropicalis NOT DETECTED NOT DETECTED Final    Comment: Performed at [facility], [address] [place], [place] [address]  CULTURE, BLOOD (ROUTINE X 2) w Reflex to ID Panel     Status: None (Preliminary result)   Collection Time: 02/13/19  3:09 PM   Specimen: BLOOD  Result Value  Ref Range Status   Specimen Description BLOOD BLOOD LEFT HAND  Final   Special Requests   Final    BOTTLES DRAWN AEROBIC AND ANAEROBIC Blood Culture adequate volume   Culture   Final    NO GROWTH < 24 HOURS Performed at [facility], [address] [place], [place] [address]    Report Status PENDING  Incomplete    Best Practice/Protocols:  VTE Prophylaxis: Heparin (SQ)    Studies: EEG  Result Date: 02/11/2019 Lora Havens, MD     02/11/2019  7:37 PM Patient Name: Jamie Arias MRN: [id number] Epilepsy Attending: Lora Havens Referring Physician/Provider: Dr Vernard Gambles Date: 02/11/2019 Duration: 36.39mins Patient history: 63yo F with ams and jaw twitching. EEG to evaluate for seizure Level of alertness: comatose AEDs during EEG study: None Technical aspects: This EEG study was done with scalp electrodes positioned according to the 10-20 International system of electrode placement. Electrical activity was acquired at a sampling rate of 500Hz  and reviewed with a high frequency filter of 70Hz  and a low frequency filter of 1Hz . EEG data were recorded continuously and digitally stored. DESCRIPTION: EEG showed continuous generalized 3-5hz  theta-delta slowing. Triphasic waves, generalized, maximal bifrontal, at 1.5-2Hz  were also seen. Multiple episodes of jaw twitching were recorded without eeg change. Hyperventilation and photic stimulation were not performed. ABNORMALITY - Continuous slow, generalized - Triphasic waves, generalized IMPRESSION: This study is suggestive of severe diffuse encephalopathy, non specific to etiology but could be secondary to toxic-metabolic causes. Multiple episodes of jaw twitching were recorded without EEG correlate and were most likely non epileptic. No seizures or epileptiform discharges were seen throughout the recording. Lora Havens   CT Head Wo Contrast  Result Date: 02/09/2019 CLINICAL DATA:  Altered level of consciousness, altered  mental status  EXAM: CT HEAD WITHOUT CONTRAST TECHNIQUE: Contiguous axial images were obtained from the base of the skull through the vertex without intravenous contrast. COMPARISON:  CT head August 23, 2018, MRI October 10, 2017 FINDINGS: Brain: No evidence of acute infarction, hemorrhage, hydrocephalus, extra-axial collection or mass lesion/mass effect. Symmetric prominence of the ventricles, cisterns and sulci compatible with parenchymal volume loss. Patchy areas of white matter hypoattenuation are most compatible with chronic microvascular angiopathy. No Vascular: Atherosclerotic calcification of the carotid siphons and intradural vertebral arteries. No hyperdense vessel. Skull: No calvarial fracture or suspicious osseous lesion. No scalp swelling or hematoma. Sinuses/Orbits: Paranasal sinuses and mastoid air cells are predominantly clear. Orbital structures are unremarkable aside from prior lens extractions. Other: None IMPRESSION: 1. No acute intracranial abnormality. 2. Mild parenchymal atrophy and chronic microvascular angiopathy. Electronically Signed   By: Lovena Le M.D.   On: 02/09/2019 15:33   MR BRAIN WO CONTRAST  Result Date: 02/12/2019 CLINICAL DATA:  Encephalopathy and ataxia EXAM: MRI HEAD WITHOUT CONTRAST TECHNIQUE: Multiplanar, multiecho pulse sequences of the brain and surrounding structures were obtained without intravenous contrast. COMPARISON:  Brain MRI 10/10/2017 FINDINGS: BRAIN: There is no acute infarct, acute hemorrhage or extra-axial collection. Multifocal white matter hyperintensity, most commonly due to chronic ischemic microangiopathy. The cerebral and cerebellar volume are age-appropriate. There is no hydrocephalus. The midline structures are normal. VASCULAR: The major intracranial arterial and venous sinus flow voids are normal. Susceptibility-sensitive sequences show no chronic microhemorrhage or superficial siderosis. SKULL AND UPPER CERVICAL SPINE: Calvarial bone marrow  signal is normal. There is no skull base mass. The visualized upper cervical spine and soft tissues are normal. SINUSES/ORBITS: There are no fluid levels or advanced mucosal thickening. The mastoid air cells and middle ear cavities are free of fluid. The orbits are normal. IMPRESSION: 1. No acute intracranial abnormality. 2. Mild chronic small vessel disease. Electronically Signed   By: Ulyses Jarred M.D.   On: 02/12/2019 22:17   US Venous Img Upper Uni Left (DVT)  Result Date: 02/11/2019 CLINICAL DATA:  Left upper extremity edema. History of pulmonary embolism. Patient is currently on anticoagulation. Evaluate for DVT. EXAM: LEFT UPPER EXTREMITY VENOUS DOPPLER ULTRASOUND TECHNIQUE: Gray-scale sonography with graded compression, as well as color Doppler and duplex ultrasound were performed to evaluate the upper extremity deep venous system from the level of the subclavian vein and including the jugular, axillary, basilic, radial, ulnar and upper cephalic vein. Spectral Doppler was utilized to evaluate flow at rest and with distal augmentation maneuvers. COMPARISON:  None. FINDINGS: Contralateral Subclavian Vein: Respiratory phasicity is normal and symmetric with the symptomatic side. No evidence of thrombus. Normal compressibility. Internal Jugular Vein: No evidence of thrombus. Normal compressibility, respiratory phasicity and response to augmentation. Subclavian Vein: No evidence of thrombus. Normal compressibility, respiratory phasicity and response to augmentation. Axillary Vein: No evidence of thrombus. Normal compressibility, respiratory phasicity and response to augmentation. Cephalic Vein: No evidence of thrombus. Normal compressibility, respiratory phasicity and response to augmentation. Basilic Vein: No evidence of thrombus. Normal compressibility, respiratory phasicity and response to augmentation. Brachial Veins: There is age-indeterminate hypoechoic occlusive thrombus within the mid humeral aspect  of one of the paired left brachial veins (image 19). The adjacent brachial vein appears patent where imaged. Radial Veins: No evidence of thrombus. Normal compressibility, respiratory phasicity and response to augmentation. Ulnar Veins: No evidence of thrombus. Normal compressibility, respiratory phasicity and response to augmentation. Venous Reflux:  None visualized. Other Findings:  None visualized. IMPRESSION: Examination is positive for age-indeterminate  short segment occlusive DVT involving the mid humeral aspect of one of the paired brachial veins. There is no extension of this short-segment occlusive DVT to the more proximal venous system of the left upper extremity. Electronically Signed   By: Sandi Mariscal M.D.   On: 02/11/2019 11:34   DG Chest Port 1 View  Result Date: 02/09/2019 CLINICAL DATA:  Chest pain EXAM: PORTABLE CHEST 1 VIEW COMPARISON:  08/23/2018 FINDINGS: There is bilateral mild interstitial thickening. There is a trace left pleural effusion. There is no focal consolidation. There is no pneumothorax. There is stable cardiomegaly. There is evidence of prior CABG. There is no acute osseous abnormality. IMPRESSION: Cardiomegaly with mild pulmonary vascular congestion. Electronically Signed   By: Kathreen Devoid   On: 02/09/2019 14:26   ECHOCARDIOGRAM COMPLETE  Result Date: 02/10/2019   ECHOCARDIOGRAM REPORT   Patient Name:   Jamie Arias Date of Exam: 02/10/2019 Medical Rec #:  [id number]      Height:       64.0 in Accession #:    [id number]     Weight:       172.2 lb Date of Birth:  08-04-1955      BSA:          1.84 m Patient Age:    26 years       BP:           82/44 mmHg Patient Gender: F              HR:           86 bpm. Exam Location:  [facility] Procedure: 2D Echo, Cardiac Doppler and Color Doppler Indications:     I48.91 Atrial Fibrillation  History:         Patient has prior history of Echocardiogram examinations, most                  recent 02/13/2018. Risk Factors:Hypertension,  Dyslipidemia and                  Diabetes. Pulmonary hypertension. Pleural effusion. Myocardial                  infarction. Chronic diastolic heart failure. Coronary artery                  disease. End stage renal disease.  Sonographer:     Wilford Sports Rodgers-Jones Referring Phys:  [id number] Jennye Boroughs Diagnosing Phys: Nelva Bush MD IMPRESSIONS  1. Left ventricular ejection fraction, by visual estimation, is 60 to 65%. The left ventricle has normal function. There is mildly increased left ventricular hypertrophy.  2. Elevated left atrial pressure.  3. Left ventricular diastolic parameters are consistent with Grade II diastolic dysfunction (pseudonormalization).  4. The left ventricle has no regional wall motion abnormalities.  5. Global right ventricle has mildly reduced systolic function.The right ventricular size is mildly enlarged. Mildly increased right ventricular wall thickness.  6. Left atrial size was normal.  7. Right atrial size was normal.  8. Mild mitral annular calcification.  9. The mitral valve is normal in structure. Trace mitral valve regurgitation. No evidence of mitral stenosis. 10. The tricuspid valve is normal in structure. Tricuspid valve regurgitation moderate-severe. 11. The aortic valve is tricuspid. Aortic valve regurgitation is trivial. Mild to moderate aortic valve sclerosis/calcification without any evidence of aortic stenosis. 12. The pulmonic valve was grossly normal. Pulmonic valve regurgitation is trivial. 13. Mildly elevated pulmonary artery systolic pressure. 14. The inferior vena cava  is normal in size with <50% respiratory variability, suggesting right atrial pressure of 8 mmHg. FINDINGS  Left Ventricle: Left ventricular ejection fraction, by visual estimation, is 60 to 65%. The left ventricle has normal function. The left ventricle has no regional wall motion abnormalities. The left ventricular internal cavity size was the left ventricle is normal in size. There is mildly  increased left ventricular hypertrophy. Left ventricular diastolic parameters are consistent with Grade II diastolic dysfunction (pseudonormalization). Elevated left atrial pressure. Right Ventricle: The right ventricular size is mildly enlarged. Mildly increased right ventricular wall thickness. Global RV systolic function is has mildly reduced systolic function. The tricuspid regurgitant velocity is 2.74 m/s, and with an assumed right atrial pressure of 8 mmHg, the estimated right ventricular systolic pressure is mildly elevated at 38.0 mmHg. Left Atrium: Left atrial size was normal in size. Right Atrium: Right atrial size was normal in size Pericardium: There is no evidence of pericardial effusion. Mitral Valve: The mitral valve is normal in structure. Mild mitral annular calcification. No evidence of mitral valve stenosis by observation. Trace mitral valve regurgitation. Tricuspid Valve: The tricuspid valve is normal in structure. Tricuspid valve regurgitation moderate-severe. Aortic Valve: The aortic valve is tricuspid. . There is mild thickening and mild calcification of the aortic valve. Aortic valve regurgitation is trivial. Mild to moderate aortic valve sclerosis/calcification is present, without any evidence of aortic stenosis. There is mild thickening of the aortic valve. There is mild calcification of the aortic valve. Pulmonic Valve: The pulmonic valve was grossly normal. Pulmonic valve regurgitation is trivial. No evidence of pulmonic stenosis. Aorta: The aortic root is normal in size and structure. Pulmonary Artery: The pulmonary artery is not well seen. Venous: The inferior vena cava is normal in size with less than 50% respiratory variability, suggesting right atrial pressure of 8 mmHg. IAS/Shunts: No atrial level shunt detected by color flow Doppler.  LEFT VENTRICLE PLAX 2D LVIDd:         3.77 cm  Diastology LVIDs:         2.71 cm  LV e' lateral:   9.36 cm/s LV PW:         1.08 cm  LV E/e' lateral:  13.1 LV IVS:        0.91 cm  LV e' medial:    6.53 cm/s LVOT diam:     1.90 cm  LV E/e' medial:  18.8 LV SV:         34 ml LV SV Index:   17.62 LVOT Area:     2.84 cm  RIGHT VENTRICLE RV Basal diam:  4.52 cm RV S prime:     9.03 cm/s TAPSE (M-mode): 1.5 cm LEFT ATRIUM             Index       RIGHT ATRIUM           Index LA diam:        3.80 cm 2.07 cm/m  RA Area:     16.10 cm LA Vol (A2C):   41.1 ml 22.39 ml/m RA Volume:   44.50 ml  24.24 ml/m LA Vol (A4C):   52.7 ml 28.71 ml/m LA Biplane Vol: 48.4 ml 26.37 ml/m  AORTIC VALVE LVOT Vmax:   107.50 cm/s LVOT Vmean:  80.450 cm/s LVOT VTI:    0.244 m  AORTA Ao Root diam: 3.00 cm MITRAL VALVE  TRICUSPID VALVE MV Area (PHT): 3.08 cm              TR Peak grad:   30.0 mmHg MV PHT:        71.34 msec            TR Vmax:        274.00 cm/s MV Decel Time: 246 msec MV E velocity: 123.00 cm/s 103 cm/s  SHUNTS MV A velocity: 104.00 cm/s 70.3 cm/s Systemic VTI:  0.24 m MV E/A ratio:  1.18        1.5       Systemic Diam: 1.90 cm  Nelva Bush MD Electronically signed by Nelva Bush MD Signature Date/Time: 02/10/2019/7:19:39 PM    Final         Objective:  Vital signs for last 24 hours: Temp:  [98 F (36.7 C)-98.9 F (37.2 C)] 98 F (36.7 C) (12/12 1300) Pulse Rate:  [59-70] 70 (12/12 1400) Resp:  [12-66] 13 (12/12 1400) BP: (120-159)/(49-70) 145/57 (12/12 1400) SpO2:  [92 %-100 %] 100 % (12/12 1400)  Hemodynamic parameters for last 24 hours:    Intake/Output from previous day: 12/11 0701 - 12/12 0700 In: 526.7 [I.V.:242.6; IV Piggyback:284] Out: -   Intake/Output this shift: Total I/O In: 37.4 [I.V.:37.4] Out: -   Vent settings for last 24 hours:    Physical Exam:  General: Chronically ill-appearing, lying in bed  HEENT moist oral mucous membranes  Pulm/lungs normal breathing effort,  O2, clear  CVS/Heart irregular rhythm  Abdomen: Soft, nontender, protuberant  Extremities: Trace edema, left arm  dependent edema  Neurologic: Confused, occasionally moaning, answers questions with monosyllables, no obvious focality.  Twitching of the muscles of the lower jaw.  Skin: Warm/dry skin wounds as previous all POA  Access: AV fistula, right arm     Assessment/Plan:  Severe metabolic encephalopathy due to severe sepsis Adult failure to thrive Deconditioning post COVID-19 infection High risk for intubation continue to monitor EEG, consider MRI if develops localizing signs Supportive care Avoid sedatives No evidence of respiratory failure   ESRD on dialysis Dialysis per renal Medications dosed accordingly Electrolyte replacement as needed AVF is nonfunctional - vascular surgery has been consulted    Severe sepsis with septic shock MRSA Bacteremia -C/w vanco -repeat bx +MRSA  -CVP trend -IVF rehydration  CARDIAC ICU monitoring No evidence of cardiac dysfunction  ID continue IV abx as per ID follow up cultures   NUTRITIONAL DIET-->NPO>> swallow eval as able Constipation protocol as indicated   ENDO  ICU hypoglycemic\Hyperglycemia protocol if needed   ELECTROLYTES follow labs as needed replace as needed pharmacy consulted and following      LOS: 5 days   Critical care provider statement:    Critical care time (minutes):  33   Critical care time was exclusive of:  Separately billable procedures and  treating other patients   Critical care was necessary to treat or prevent imminent or  life-threatening deterioration of the following conditions:   Post COVID-19 , encephalitis, sepsis, MRSA bacteremia, multiple comorbid conditions   Critical care was time spent personally by me on the following  activities:  Development of treatment plan with patient or surrogate,  discussions with consultants, evaluation of patient's response to  treatment, examination of patient, obtaining history from patient or  surrogate, ordering and performing treatments  and interventions, ordering  and review of laboratory studies and re-evaluation of patient's condition   I assumed direction of critical care for this patient  from another  provider in my specialty: no      Ottie Glazier, M.D.  [facility] [place]

## 2019-02-14 NOTE — Progress Notes (Signed)
[place], [place] 02/14/19  Subjective:   Hospital day # 5 Patient presented to the emergency room via EMS for abdominal pain and altered mental status.  Work-up so far shows negative ammonia, mildly elevated troponin, negative for alcohol.      Neuro: Came in with altered mental status and confusion.  Still has dystonic jaw movements, but more awake during and interactive.  Able to follow simple commands cvs: Mouth appears still dry.  No pressors Pulm: room air today gi: No nausea or vomiting reported. No oral intake- too lethargic Renal:  Last HD on Thursday; potassium low but improving ID: MRSA bacteremia; isolation    Objective:  Vital signs in last 24 hours:  Temp:  [98.6 F (37 C)-99.4 F (37.4 C)] 98.9 F (37.2 C) (12/12 0400) Pulse Rate:  [59-70] 68 (12/12 0800) Resp:  [14-66] 30 (12/12 0800) BP: (120-159)/(49-77) 143/57 (12/12 0800) SpO2:  [92 %-100 %] 95 % (12/12 0800)  Weight change:  Filed Weights   02/10/19 1700 02/12/19 1130 02/12/19 1445  Weight: 78.1 kg 78 kg 78.2 kg    Intake/Output:    Intake/Output Summary (Last 24 hours) at 02/14/2019 0852 Last data filed at 02/14/2019 0600 Gross per 24 hour  Intake 526.65 ml  Output --  Net 526.65 ml     Physical Exam: General:  Critically ill-appearing, lying in the bed  HEENT  dry oral mucous membranes  Pulm/lungs  shallow breathing effort,  Clear to auscultation  CVS/Heart  regular rhythm in 60's  Abdomen:   Soft, nontender  Extremities:  b/l arm dependent edema  Neurologic:  More alert, interactive, able to answer questions, disoriented  Skin:  Warm  Access:  AV fistula, right arm       Basic Metabolic Panel:  Recent Labs  Lab 02/10/19 0500 02/11/19 0825 02/12/19 0444 02/13/19 0453 02/14/19 0340  NA 140 142 145 143 142  K 3.0* 2.9* 2.6* 3.2* 3.1*  CL 99 101 103 103 104  CO2 27 28 30 27 26   GLUCOSE 88 173* 118* 108* 109*  BUN 17 12 16 17 23   CREATININE  4.12* 3.01* 3.74* 3.31* 4.03*  CALCIUM 7.9* 7.8* 8.2* 8.4* 8.0*  MG  --  1.7 1.7 2.3 2.2  PHOS  --   --  2.0* 1.7* 2.2*     CBC: Recent Labs  Lab 02/09/19 1143 02/10/19 0500 02/11/19 0825 02/12/19 0444 02/13/19 0453 02/14/19 0340  WBC 15.8* 6.7 14.4* 8.2 6.9 8.0  NEUTROABS 14.9*  --   --   --   --   --   HGB 10.7* 9.0* 8.8* 7.8* 7.9* 7.2*  HCT 34.1* 26.6* 26.5* 24.4* 24.7* 22.2*  MCV 85.0 81.6 81.5 84.4 86.1 83.8  PLT 215 172 140* 131* 107* 106*      Lab Results  Component Value Date   HEPBSAG NON REACTIVE 02/10/2019      Microbiology:  Recent Results (from the past 240 hour(s))  Culture, blood (routine x 2)     Status: Abnormal   Collection Time: 02/09/19  2:29 PM   Specimen: BLOOD LEFT ARM  Result Value Ref Range Status   Specimen Description   Final    BLOOD LEFT ARM Performed at [facility], [address], [place], [place] [address]    Special Requests   Final    BOTTLES DRAWN AEROBIC AND ANAEROBIC Blood Culture adequate volume Performed at [facility], [address], [place], [place] [address]  Culture  Setup Time   Final    GRAM POSITIVE COCCI IN BOTH AEROBIC AND ANAEROBIC BOTTLES CRITICAL VALUE NOTED.  VALUE IS CONSISTENT WITH PREVIOUSLY REPORTED AND CALLED VALUE. Performed at [facility], [address] [place], [place] [address]    Culture (A)  Final    STAPHYLOCOCCUS AUREUS SUSCEPTIBILITIES PERFORMED ON PREVIOUS CULTURE WITHIN THE LAST 5 DAYS. Performed at [facility], [place] [address], [place], [place] [address]    Report Status 02/12/2019 FINAL  Final  Culture, blood (routine x 2)     Status: Abnormal   Collection Time: 02/09/19  2:38 PM   Specimen: BLOOD LEFT ARM  Result Value Ref Range Status   Specimen Description   Final    BLOOD LEFT ARM Performed at [facility], [name] [place], [place] [address]    Special Requests   Final    BOTTLES DRAWN AEROBIC AND ANAEROBIC Blood  Culture adequate volume Performed at [facility], [address], [place], [place] [address]    Culture  Setup Time   Final    GRAM POSITIVE COCCI IN BOTH AEROBIC AND ANAEROBIC BOTTLES CRITICAL RESULT CALLED TO, READ BACK BY AND VERIFIED WITH: [place] [id number] 02/10/2019 HNM Performed at [facility], [place] [address], [place], [place] [address]    Culture METHICILLIN RESISTANT STAPHYLOCOCCUS AUREUS (A)  Final   Report Status 02/12/2019 FINAL  Final   Organism ID, Bacteria METHICILLIN RESISTANT STAPHYLOCOCCUS AUREUS  Final      Susceptibility   Methicillin resistant staphylococcus aureus - MIC*    CIPROFLOXACIN >=8 RESISTANT Resistant     ERYTHROMYCIN >=8 RESISTANT Resistant     GENTAMICIN <=0.5 SENSITIVE Sensitive     OXACILLIN >=4 RESISTANT Resistant     TETRACYCLINE <=1 SENSITIVE Sensitive     VANCOMYCIN 1 SENSITIVE Sensitive     TRIMETH/SULFA <=10 SENSITIVE Sensitive     CLINDAMYCIN RESISTANT Resistant     RIFAMPIN <=0.5 SENSITIVE Sensitive     Inducible Clindamycin POSITIVE Resistant     * METHICILLIN RESISTANT STAPHYLOCOCCUS AUREUS  Blood Culture ID Panel (Reflexed)     Status: Abnormal   Collection Time: 02/09/19  2:38 PM  Result Value Ref Range Status   Enterococcus species NOT DETECTED NOT DETECTED Final   Listeria monocytogenes NOT DETECTED NOT DETECTED Final   Staphylococcus species DETECTED (A) NOT DETECTED Final    Comment: CRITICAL RESULT CALLED TO, READ BACK BY AND VERIFIED WITH: Hart Robinsons PHARMD [id number] 02/10/2019 HNM    Staphylococcus aureus (BCID) DETECTED (A) NOT DETECTED Final    Comment: Methicillin (oxacillin)-resistant Staphylococcus aureus (MRSA). MRSA is predictably resistant to beta-lactam antibiotics (except ceftaroline). Preferred therapy is vancomycin unless clinically contraindicated. Patient requires contact precautions if  hospitalized. CRITICAL RESULT CALLED TO, READ BACK BY AND VERIFIED WITH: Hart Robinsons PHARMD [id number] 02/10/2019  HNM    Methicillin resistance DETECTED (A) NOT DETECTED Final    Comment: CRITICAL RESULT CALLED TO, READ BACK BY AND VERIFIED WITH: Hart Robinsons PHARMD [id number] 02/10/2019 HNM    Streptococcus species NOT DETECTED NOT DETECTED Final   Streptococcus agalactiae NOT DETECTED NOT DETECTED Final   Streptococcus pneumoniae NOT DETECTED NOT DETECTED Final   Streptococcus pyogenes NOT DETECTED NOT DETECTED Final   Acinetobacter baumannii NOT DETECTED NOT DETECTED Final   Enterobacteriaceae species NOT DETECTED NOT DETECTED Final   Enterobacter cloacae complex NOT DETECTED NOT DETECTED Final   Escherichia coli NOT DETECTED NOT DETECTED Final   Klebsiella  oxytoca NOT DETECTED NOT DETECTED Final   Klebsiella pneumoniae NOT DETECTED NOT DETECTED Final   Proteus species NOT DETECTED NOT DETECTED Final   Serratia marcescens NOT DETECTED NOT DETECTED Final   Haemophilus influenzae NOT DETECTED NOT DETECTED Final   Neisseria meningitidis NOT DETECTED NOT DETECTED Final   Pseudomonas aeruginosa NOT DETECTED NOT DETECTED Final   Candida albicans NOT DETECTED NOT DETECTED Final   Candida glabrata NOT DETECTED NOT DETECTED Final   Candida krusei NOT DETECTED NOT DETECTED Final   Candida parapsilosis NOT DETECTED NOT DETECTED Final   Candida tropicalis NOT DETECTED NOT DETECTED Final    Comment: Performed at [facility], [place], [place] [address]  SARS CORONAVIRUS 2 (TAT 6-24 HRS) Nasopharyngeal Nasopharyngeal Swab     Status: None   Collection Time: 02/09/19  2:54 PM   Specimen: Nasopharyngeal Swab  Result Value Ref Range Status   SARS Coronavirus 2 NEGATIVE NEGATIVE Final    Comment: (NOTE) SARS-CoV-2 target nucleic acids are NOT DETECTED. The SARS-CoV-2 RNA is generally detectable in upper and lower respiratory specimens during the acute phase of infection. Negative results do not preclude SARS-CoV-2 infection, do not rule out co-infections with other pathogens, and should  not be used as the sole basis for treatment or other patient management decisions. Negative results must be combined with clinical observations, patient history, and epidemiological information. The expected result is Negative. Fact Sheet for Patients: SugarRoll.be Fact Sheet for Healthcare Providers: https://www.woods-mathews.com/ This test is not yet approved or cleared by the [place] FDA and  has been authorized for detection and/or diagnosis of SARS-CoV-2 by FDA under an Emergency Use Authorization (EUA). This EUA will remain  in effect (meaning this test can be used) for the duration of the COVID-19 declaration under Section 56 4(b)(1) of the Act, 21 U.S.C. section 360bbb-3(b)(1), unless the authorization is terminated or revoked sooner. Performed at [facility], [place] [address], [place], [place] [address]   MRSA PCR Screening     Status: Abnormal   Collection Time: 02/10/19  5:03 PM   Specimen: Nasopharyngeal  Result Value Ref Range Status   MRSA by PCR POSITIVE (A) NEGATIVE Final    Comment:        The GeneXpert MRSA Assay (FDA approved for NASAL specimens only), is one component of a comprehensive MRSA colonization surveillance program. It is not intended to diagnose MRSA infection nor to guide or monitor treatment for MRSA infections. RESULT CALLED TO, READ BACK BY AND VERIFIED WITH: ALEKSEY FRASER @1839  02/10/19 MJU Performed at [facility], [address] [place], [place] [address]   Culture, blood (routine x 2)     Status: Abnormal   Collection Time: 02/11/19 12:31 PM   Specimen: BLOOD  Result Value Ref Range Status   Specimen Description   Final    BLOOD PORTA CATH Performed at [facility] [address], [place], [place] [address]    Special Requests   Final    BOTTLES DRAWN AEROBIC AND ANAEROBIC Blood Culture adequate volume Performed at [facility], [address]  [place], [place] [address]    Culture  Setup Time   Final    GRAM POSITIVE COCCI ANAEROBIC BOTTLE ONLY CRITICAL RESULT CALLED TO, READ BACK BY AND VERIFIED WITH: SCOTT HALL AT [id number] 02/12/2019 [facility]  Performed at [facility], [address] [place], [place] [address]    Culture (A)  Final    STAPHYLOCOCCUS AUREUS  SUSCEPTIBILITIES PERFORMED ON PREVIOUS CULTURE WITHIN THE LAST 5 DAYS. Performed at [facility], [place] [address] [place], [place] [address]    Report Status 02/13/2019 FINAL  Final  Culture, blood (routine x 2)     Status: None (Preliminary result)   Collection Time: 02/11/19  3:57 PM   Specimen: BLOOD  Result Value Ref Range Status   Specimen Description BLOOD PORTA CATH  Final   Special Requests   Final    BOTTLES DRAWN AEROBIC AND ANAEROBIC Blood Culture adequate volume   Culture   Final    NO GROWTH 3 DAYS Performed at [facility], [address], [place], [place] [address]    Report Status PENDING  Incomplete  CULTURE, BLOOD (ROUTINE X 2) w Reflex to ID Panel     Status: None (Preliminary result)   Collection Time: 02/13/19  2:18 PM   Specimen: BLOOD  Result Value Ref Range Status   Specimen Description   Final    BLOOD Blood Culture results may not be optimal due to an inadequate volume of blood received in culture bottles   Special Requests   Final    BOTTLES DRAWN AEROBIC AND ANAEROBIC BLOOD LEFT HAND   Culture  Setup Time   Final    Organism ID to follow AEROBIC BOTTLE ONLY GRAM POSITIVE COCCI IN CLUSTERS Performed at [facility], [address] [place], [place] [address]    Culture GRAM POSITIVE COCCI  Final   Report Status PENDING  Incomplete  CULTURE, BLOOD (ROUTINE X 2) w Reflex to ID Panel     Status: None (Preliminary result)   Collection Time: 02/13/19  3:09 PM   Specimen: BLOOD  Result Value Ref Range Status   Specimen Description BLOOD BLOOD LEFT HAND  Final   Special Requests   Final    BOTTLES DRAWN AEROBIC  AND ANAEROBIC Blood Culture adequate volume   Culture   Final    NO GROWTH < 24 HOURS Performed at [facility], [address] [place], [place] [address]    Report Status PENDING  Incomplete    Coagulation Studies: No results for input(s): LABPROT, INR in the last 72 hours.  Urinalysis: No results for input(s): COLORURINE, LABSPEC, PHURINE, GLUCOSEU, HGBUR, BILIRUBINUR, KETONESUR, PROTEINUR, UROBILINOGEN, NITRITE, LEUKOCYTESUR in the last 72 hours.  Invalid input(s): APPERANCEUR    Imaging: MR BRAIN WO CONTRAST  Result Date: 02/12/2019 CLINICAL DATA:  Encephalopathy and ataxia EXAM: MRI HEAD WITHOUT CONTRAST TECHNIQUE: Multiplanar, multiecho pulse sequences of the brain and surrounding structures were obtained without intravenous contrast. COMPARISON:  Brain MRI 10/10/2017 FINDINGS: BRAIN: There is no acute infarct, acute hemorrhage or extra-axial collection. Multifocal white matter hyperintensity, most commonly due to chronic ischemic microangiopathy. The cerebral and cerebellar volume are age-appropriate. There is no hydrocephalus. The midline structures are normal. VASCULAR: The major intracranial arterial and venous sinus flow voids are normal. Susceptibility-sensitive sequences show no chronic microhemorrhage or superficial siderosis. SKULL AND UPPER CERVICAL SPINE: Calvarial bone marrow signal is normal. There is no skull base mass. The visualized upper cervical spine and soft tissues are normal. SINUSES/ORBITS: There are no fluid levels or advanced mucosal thickening. The mastoid air cells and middle ear cavities are free of fluid. The orbits are normal. IMPRESSION: 1. No acute intracranial abnormality. 2. Mild chronic small vessel disease. Electronically Signed   By: Ulyses Jarred M.D.   On: 02/12/2019 22:17     Medications:   . sodium chloride 250 mL (02/10/19  [id number])  . albumin human Stopped (02/13/19 1540)  . heparin 1,200 Units/hr (02/14/19 0600)  . norepinephrine  (LEVOPHED) Adult infusion Stopped (02/12/19 0450)  . vancomycin     . aspirin  81 mg Per Tube Daily  . atorvastatin  40 mg Per Tube q1800  . chlorhexidine  15 mL Mouth Rinse BID  . Chlorhexidine Gluconate Cloth  6 each Topical Q0600  . Chlorhexidine Gluconate Cloth  6 each Topical Q0600  . epoetin (EPOGEN/PROCRIT) injection  4,000 Units Intravenous Once  . FLUoxetine  20 mg Per Tube Daily  . fluticasone  2 spray Each Nare Daily  . insulin aspart  0-9 Units Subcutaneous TID WC  . levothyroxine  88 mcg Per Tube QAC breakfast  . mouth rinse  15 mL Mouth Rinse q12n4p  . midodrine  10 mg Per Tube TID WC  . multivitamin  15 mL Per Tube Daily  . mupirocin ointment  1 application Nasal BID  . pantoprazole (PROTONIX) IV  40 mg Intravenous Q24H  . sodium chloride flush  3 mL Intravenous Once  . venlafaxine  75 mg Per Tube BID WC  . zinc sulfate  220 mg Per Tube Daily   sodium chloride, fentaNYL (SUBLIMAZE) injection, ondansetron (ZOFRAN) IV  Assessment/ Plan:  63 y.o.caucasian female with end-stage renal disease, atrial fibrillation, chronic diastolic congestive heart failure, CAD status post PCI, CABG,  type 2 diabetes, hypertension, hyperlipidemia, hypothyroidism, Covid positive in July 2020, recent nursing home stay in [place]  NEPHROLOGIST: Lavonia Dana MD  LOCATION: [place]: M-W-F 2nd Shift  EDW: 81.5 kg.  KIDNEY: [place] Optiflux 180NR  LITERS PROC: liters/treatment  HD TIME: 225  ACCESS: R Upper Arm AVF  NEEDLE SIZE:  ANTICOAG: Custom Heparin 2000  BATH: 2K/2.5Ca  QB: 400 ml/min  QD: 600 ml/min   1.  End-stage renal disease - plan for dialysis on later today - Patient still appears dry. Potassium low, will use 4 k bath - continue midodrine prior to HD  2.  Anemia of chronic kidney disease anemia of chronic kidney disease Lab Results  Component Value Date   HGB 7.2 (L) 02/14/2019  - will consider adding EPO with HD treatments  3. SHPTH - hold  binders and non essential meds  4. Altered mental status - work up in progress, likely due to sepsis -Clinically improving today  5. Sepsis - blood culture positive for MRSA from 02/09/2019  ? Source; ID team following  6. Hypokalemia Intermittent iv replacement as needed  7. Left arm DVT Heparin infusion    LOS: [place] 12/12/20208:52 AM  [place], [place]  Note: This note was prepared with Dragon dictation. Any transcription errors are unintentional

## 2019-02-14 NOTE — Progress Notes (Signed)
PROGRESS NOTE    Jamie Arias  [id number] DOB: 03-01-1956 DOA: 02/09/2019 PCP: [name], MD    Brief Narrative:  63 year old female with end-stage renal disease on hemodialysis, admitted to the hospital with severe septic shock secondary to MRSA bacteremia. She had associated metabolic encephalopathy. She is being treated with intravenous vancomycin. She is not requiring any further pressors. Encephalopathy has been slow to improve.   Assessment & Plan:   Principal Problem:   Sepsis due to methicillin resistant Staphylococcus aureus (MRSA) ([place]) Active Problems:   Diabetes mellitus, type 2 (HCC)   Hypotension   Chronic diastolic CHF (congestive heart failure) (HCC)   ESRD (end stage renal disease) (HCC)   Altered level of consciousness   Pressure injury of skin   MRSA bacteremia   Arm DVT (deep venous thromboembolism), acute, left (HCC)   Acute encephalopathy   1. Septic shock secondary to MRSA bacteremia. Currently on vancomycin. Infectious disease following. 2D echocardiogram does not show any evidence of vegetations. TEE has been requested, discussed with cardiology. Overall hemodynamics are improving. She is not requiring any vasopressor support. Etiology of bacteremia is not entirely clear.  She repeatedly has 1 out of 2 positive blood cultures.  I suspect positive cultures being drawn from her central line.  IV team has tried to place peripheral in her left arm, but due to significant edema has been unable.  Discussed with Dr. Lanney Gins and she may need a midline placed so that PICC can be removed 2. End-stage renal disease on hemodialysis. Nephrology following for dialysis needs. Unable to cannulate AVF today, may need vascular evaluation 3. Acute encephalopathy. Suspect this is related to sepsis. Overall her mental status appears to be improving. MRI of the brain did not show any acute infarct. EEG did not show any evidence of seizures. She was noted to have  twitching in her lower jaw, but her husband reports this is been present for the past several months. 4. Acute left arm DVT. Noted on venous Dopplers. Started on heparin infusion. 5. History of atrial fibrillation. Taking amiodarone and Eliquis prior to admission. Currently in sinus rhythm. Continue to follow heart rate. She will be anticoagulated for her venous thromboembolism. 6. Chronic diastolic congestive heart failure. Appears compensated at this time. 7. Diabetes. Continue on sliding scale insulin. Blood sugars are currently stable.   DVT prophylaxis: Heparin infusion Code Status: Full code Family Communication: Discussed with husband at the bedside Disposition Plan: May need placement pending hospital course   Consultants:   ID  Nephrology  Procedures:   Right femoral CVC 12/8>  Right femoral art line 12/8>  Antimicrobials:   Vancomycin    Subjective: Denies any shortness of breath or any pain.  Appears more awake and coherent today.  Objective: Vitals:   02/14/19 1200 02/14/19 1230 02/14/19 1300 02/14/19 1400  BP: (!) 159/65 (!) 155/56 (!) 149/53 (!) 145/57  Pulse: 67 70 69 70  Resp: (!) 29 (!) 62 16 13  Temp:   98 F (36.7 C)   TempSrc:   Axillary   SpO2: 98% 99% 99% 100%  Weight:      Height:        Intake/Output Summary (Last 24 hours) at 02/14/2019 1723 Last data filed at 02/14/2019 1448 Gross per 24 hour  Intake 564.07 ml  Output --  Net 564.07 ml   Filed Weights   02/10/19 1700 02/12/19 1130 02/12/19 1445  Weight: 78.1 kg 78 kg 78.2 kg    Examination:  General exam: Alert, awake, no distress Respiratory system: Clear to auscultation. Respiratory effort normal. Cardiovascular system:RRR. No murmurs, rubs, gallops. Gastrointestinal system: Abdomen is nondistended, soft and nontender. No organomegaly or masses felt. Normal bowel sounds heard. Central nervous system:  No focal neurological deficits.  Tremor in lower jaw Extremities:  Significant edema in upper extremities Skin: No rashes, lesions or ulcers Psychiatry: Judgement and insight appear normal. Mood & affect appropriate.      Data Reviewed: I have personally reviewed following labs and imaging studies  CBC: Recent Labs  Lab 02/09/19 1143 02/10/19 0500 02/11/19 0825 02/12/19 0444 02/13/19 0453 02/14/19 0340  WBC 15.8* 6.7 14.4* 8.2 6.9 8.0  NEUTROABS 14.9*  --   --   --   --   --   HGB 10.7* 9.0* 8.8* 7.8* 7.9* 7.2*  HCT 34.1* 26.6* 26.5* 24.4* 24.7* 22.2*  MCV 85.0 81.6 81.5 84.4 86.1 83.8  PLT 215 172 140* 131* 107* [id number]*   Basic Metabolic Panel: Recent Labs  Lab 02/10/19 0500 02/11/19 0825 02/12/19 0444 02/13/19 0453 02/14/19 0340  NA 140 142 145 143 142  K 3.0* 2.9* 2.6* 3.2* 3.1*  CL 99 101 103 103 104  CO2 27 28 30 27 26   GLUCOSE 88 173* 118* 108* 109*  BUN 17 12 16 17 23   CREATININE 4.12* 3.01* 3.74* 3.31* 4.03*  CALCIUM 7.9* 7.8* 8.2* 8.4* 8.0*  MG  --  1.7 1.7 2.3 2.2  PHOS  --   --  2.0* 1.7* 2.2*   GFR: Estimated Creatinine Clearance: 14.1 mL/min (A) (by C-G formula based on SCr of 4.03 mg/dL (H)). Liver Function Tests: Recent Labs  Lab 02/09/19 1143 02/10/19 0500 02/12/19 0444 02/13/19 0453 02/14/19 0340  AST 92* 70*  --   --   --   ALT 40 35  --   --   --   ALKPHOS 105 92  --   --   --   BILITOT 1.3* 1.2  --   --   --   PROT 6.1* 5.2*  --   --   --   ALBUMIN 2.1* 1.7* 1.9* 2.2* 2.2*   Recent Labs  Lab 02/09/19 1315  LIPASE 16   Recent Labs  Lab 02/09/19 1836  AMMONIA <9*   Coagulation Profile: Recent Labs  Lab 02/09/19 1143 02/10/19 0500  INR 1.1 1.1   Cardiac Enzymes: No results for input(s): CKTOTAL, CKMB, CKMBINDEX, TROPONINI in the last 168 hours. BNP (last 3 results) No results for input(s): PROBNP in the last 8760 hours. HbA1C: No results for input(s): HGBA1C in the last 72 hours. CBG: Recent Labs  Lab 02/13/19 1549 02/13/19 2214 02/14/19 0806 02/14/19 1134 02/14/19 1618  GLUCAP  94 100* 100* 104* 125*   Lipid Profile: No results for input(s): CHOL, HDL, LDLCALC, TRIG, CHOLHDL, LDLDIRECT in the last 72 hours. Thyroid Function Tests: No results for input(s): TSH, T4TOTAL, FREET4, T3FREE, THYROIDAB in the last 72 hours. Anemia Panel: No results for input(s): VITAMINB12, FOLATE, FERRITIN, TIBC, IRON, RETICCTPCT in the last 72 hours. Sepsis Labs: Recent Labs  Lab 02/09/19 1438 02/09/19 1836 02/10/19 2018  LATICACIDVEN 2.6* 1.4 1.0    Recent Results (from the past 240 hour(s))  Culture, blood (routine x 2)     Status: Abnormal   Collection Time: 02/09/19  2:29 PM   Specimen: BLOOD LEFT ARM  Result Value Ref Range Status   Specimen Description   Final    BLOOD LEFT ARM Performed at [facility]  Lab, [address] [place], [place] [address]    Special Requests   Final    BOTTLES DRAWN AEROBIC AND ANAEROBIC Blood Culture adequate volume Performed at [facility], [address] [place], [place] [address]    Culture  Setup Time   Final    GRAM POSITIVE COCCI IN BOTH AEROBIC AND ANAEROBIC BOTTLES CRITICAL VALUE NOTED.  VALUE IS CONSISTENT WITH PREVIOUSLY REPORTED AND CALLED VALUE. Performed at [facility], [address] [place], [place] [address]    Culture (A)  Final    STAPHYLOCOCCUS AUREUS SUSCEPTIBILITIES PERFORMED ON PREVIOUS CULTURE WITHIN THE LAST 5 DAYS. Performed at [facility], [place] [address], [place], [place] [address]    Report Status 02/12/2019 FINAL  Final  Culture, blood (routine x 2)     Status: Abnormal   Collection Time: 02/09/19  2:38 PM   Specimen: BLOOD LEFT ARM  Result Value Ref Range Status   Specimen Description   Final    BLOOD LEFT ARM Performed at [facility], [address] [place], [place] [address]    Special Requests   Final    BOTTLES DRAWN AEROBIC AND ANAEROBIC Blood Culture adequate volume Performed at [facility], [address] [place], [place]  [address]    Culture  Setup Time   Final    GRAM POSITIVE COCCI IN BOTH AEROBIC AND ANAEROBIC BOTTLES CRITICAL RESULT CALLED TO, READ BACK BY AND VERIFIED WITH: [place] [id number] 02/10/2019 HNM Performed at [facility], [place] [address] [place], [place] [address]    Culture METHICILLIN RESISTANT STAPHYLOCOCCUS AUREUS (A)  Final   Report Status 02/12/2019 FINAL  Final   Organism ID, Bacteria METHICILLIN RESISTANT STAPHYLOCOCCUS AUREUS  Final      Susceptibility   Methicillin resistant staphylococcus aureus - MIC*    CIPROFLOXACIN >=8 RESISTANT Resistant     ERYTHROMYCIN >=8 RESISTANT Resistant     GENTAMICIN <=0.5 SENSITIVE Sensitive     OXACILLIN >=4 RESISTANT Resistant     TETRACYCLINE <=1 SENSITIVE Sensitive     VANCOMYCIN 1 SENSITIVE Sensitive     TRIMETH/SULFA <=10 SENSITIVE Sensitive     CLINDAMYCIN RESISTANT Resistant     RIFAMPIN <=0.5 SENSITIVE Sensitive     Inducible Clindamycin POSITIVE Resistant     * METHICILLIN RESISTANT STAPHYLOCOCCUS AUREUS  Blood Culture ID Panel (Reflexed)     Status: Abnormal   Collection Time: 02/09/19  2:38 PM  Result Value Ref Range Status   Enterococcus species NOT DETECTED NOT DETECTED Final   Listeria monocytogenes NOT DETECTED NOT DETECTED Final   Staphylococcus species DETECTED (A) NOT DETECTED Final    Comment: CRITICAL RESULT CALLED TO, READ BACK BY AND VERIFIED WITH: Hart Robinsons PHARMD [id number] 02/10/2019 HNM    Staphylococcus aureus (BCID) DETECTED (A) NOT DETECTED Final    Comment: Methicillin (oxacillin)-resistant Staphylococcus aureus (MRSA). MRSA is predictably resistant to beta-lactam antibiotics (except ceftaroline). Preferred therapy is vancomycin unless clinically contraindicated. Patient requires contact precautions if  hospitalized. CRITICAL RESULT CALLED TO, READ BACK BY AND VERIFIED WITH: Hart Robinsons PHARMD [id number] 02/10/2019 HNM    Methicillin resistance DETECTED (A) NOT DETECTED Final    Comment: CRITICAL RESULT CALLED  TO, READ BACK BY AND VERIFIED WITH: Hart Robinsons PHARMD [id number] 02/10/2019 HNM    Streptococcus species NOT DETECTED NOT DETECTED Final   Streptococcus agalactiae NOT DETECTED NOT DETECTED Final   Streptococcus pneumoniae NOT DETECTED NOT DETECTED Final   Streptococcus pyogenes NOT DETECTED  NOT DETECTED Final   Acinetobacter baumannii NOT DETECTED NOT DETECTED Final   Enterobacteriaceae species NOT DETECTED NOT DETECTED Final   Enterobacter cloacae complex NOT DETECTED NOT DETECTED Final   Escherichia coli NOT DETECTED NOT DETECTED Final   Klebsiella oxytoca NOT DETECTED NOT DETECTED Final   Klebsiella pneumoniae NOT DETECTED NOT DETECTED Final   Proteus species NOT DETECTED NOT DETECTED Final   Serratia marcescens NOT DETECTED NOT DETECTED Final   Haemophilus influenzae NOT DETECTED NOT DETECTED Final   Neisseria meningitidis NOT DETECTED NOT DETECTED Final   Pseudomonas aeruginosa NOT DETECTED NOT DETECTED Final   Candida albicans NOT DETECTED NOT DETECTED Final   Candida glabrata NOT DETECTED NOT DETECTED Final   Candida krusei NOT DETECTED NOT DETECTED Final   Candida parapsilosis NOT DETECTED NOT DETECTED Final   Candida tropicalis NOT DETECTED NOT DETECTED Final    Comment: Performed at [facility], [place], [place] [address]  SARS CORONAVIRUS 2 (TAT 6-24 HRS) Nasopharyngeal Nasopharyngeal Swab     Status: None   Collection Time: 02/09/19  2:54 PM   Specimen: Nasopharyngeal Swab  Result Value Ref Range Status   SARS Coronavirus 2 NEGATIVE NEGATIVE Final    Comment: (NOTE) SARS-CoV-2 target nucleic acids are NOT DETECTED. The SARS-CoV-2 RNA is generally detectable in upper and lower respiratory specimens during the acute phase of infection. Negative results do not preclude SARS-CoV-2 infection, do not rule out co-infections with other pathogens, and should not be used as the sole basis for treatment or other patient management decisions. Negative results  must be combined with clinical observations, patient history, and epidemiological information. The expected result is Negative. Fact Sheet for Patients: SugarRoll.be Fact Sheet for Healthcare Providers: https://www.woods-mathews.com/ This test is not yet approved or cleared by the [place] FDA and  has been authorized for detection and/or diagnosis of SARS-CoV-2 by FDA under an Emergency Use Authorization (EUA). This EUA will remain  in effect (meaning this test can be used) for the duration of the COVID-19 declaration under Section 56 4(b)(1) of the Act, 21 U.S.C. section 360bbb-3(b)(1), unless the authorization is terminated or revoked sooner. Performed at [facility], [place] [address] [place], [place] [address]   MRSA PCR Screening     Status: Abnormal   Collection Time: 02/10/19  5:03 PM   Specimen: Nasopharyngeal  Result Value Ref Range Status   MRSA by PCR POSITIVE (A) NEGATIVE Final    Comment:        The GeneXpert MRSA Assay (FDA approved for NASAL specimens only), is one component of a comprehensive MRSA colonization surveillance program. It is not intended to diagnose MRSA infection nor to guide or monitor treatment for MRSA infections. RESULT CALLED TO, READ BACK BY AND VERIFIED WITH: Britt Boozer @1839  02/10/19 MJU Performed at [facility], [address] [place], [place] [address]   Culture, blood (routine x 2)     Status: Abnormal   Collection Time: 02/11/19 12:31 PM   Specimen: BLOOD  Result Value Ref Range Status   Specimen Description   Final    BLOOD PORTA CATH Performed at [facility] [address] [place], [place] [address]    Special Requests   Final    BOTTLES DRAWN AEROBIC AND ANAEROBIC Blood Culture adequate volume Performed at [facility], [address] [place], [place] [address]    Culture  Setup Time   Final    GRAM POSITIVE COCCI ANAEROBIC BOTTLE  ONLY CRITICAL RESULT CALLED TO, READ BACK BY AND VERIFIED WITH: Hart Robinsons AT [id number] 02/12/2019 [facility]  Performed at [facility], [address] [place], [place] [address]    Culture (A)  Final    STAPHYLOCOCCUS AUREUS SUSCEPTIBILITIES PERFORMED ON PREVIOUS CULTURE WITHIN THE LAST 5 DAYS. Performed at [facility], [place] [address], [place], [place] [address]    Report Status 02/13/2019 FINAL  Final  Culture, blood (routine x 2)     Status: None (Preliminary result)   Collection Time: 02/11/19  3:57 PM   Specimen: BLOOD  Result Value Ref Range Status   Specimen Description BLOOD PORTA CATH  Final   Special Requests   Final    BOTTLES DRAWN AEROBIC AND ANAEROBIC Blood Culture adequate volume   Culture   Final    NO GROWTH 3 DAYS Performed at [facility], [address] [place], [place] [address]    Report Status PENDING  Incomplete  CULTURE, BLOOD (ROUTINE X 2) w Reflex to ID Panel     Status: None (Preliminary result)   Collection Time: 02/13/19  2:18 PM   Specimen: BLOOD LEFT HAND  Result Value Ref Range Status   Specimen Description   Final    BLOOD LEFT HAND Performed at [facility], [place] [address] [place], [place] [address]    Special Requests   Final    BOTTLES DRAWN AEROBIC AND ANAEROBIC Blood Culture results may not be optimal due to an inadequate volume of blood received in culture bottles Performed at [facility] [address], [place], [place] [address]    Culture  Setup Time   Final    Organism ID to follow AEROBIC BOTTLE ONLY GRAM POSITIVE COCCI IN CLUSTERS CRITICAL RESULT CALLED TO, READ BACK BY AND VERIFIED WITH: Herbert Pun AT [id number] ON 02/14/2019 [place]. Performed at [facility], [address] [place], [place] [address]    Culture GRAM POSITIVE COCCI IN CLUSTERS  Final   Report Status PENDING  Incomplete  Blood Culture ID Panel (Reflexed)     Status: Abnormal   Collection Time: 02/13/19  2:18 PM  Result  Value Ref Range Status   Enterococcus species NOT DETECTED NOT DETECTED Final   Listeria monocytogenes NOT DETECTED NOT DETECTED Final   Staphylococcus species DETECTED (A) NOT DETECTED Final    Comment: CRITICAL RESULT CALLED TO, READ BACK BY AND VERIFIED WITH: Herbert Pun AT [id number] ON 02/14/2019 [place].    Staphylococcus aureus (BCID) DETECTED (A) NOT DETECTED Final    Comment: Methicillin (oxacillin)-resistant Staphylococcus aureus (MRSA). MRSA is predictably resistant to beta-lactam antibiotics (except ceftaroline). Preferred therapy is vancomycin unless clinically contraindicated. Patient requires contact precautions if  hospitalized. CRITICAL RESULT CALLED TO, READ BACK BY AND VERIFIED WITH: Herbert Pun AT [id number] ON 02/14/2019 [place].    Methicillin resistance DETECTED (A) NOT DETECTED Final    Comment: CRITICAL RESULT CALLED TO, READ BACK BY AND VERIFIED WITH: Herbert Pun AT [id number] ON 02/14/2019 [place].    Streptococcus species NOT DETECTED NOT DETECTED Final   Streptococcus agalactiae NOT DETECTED NOT DETECTED Final   Streptococcus pneumoniae NOT DETECTED NOT DETECTED Final   Streptococcus pyogenes NOT DETECTED NOT DETECTED Final   Acinetobacter baumannii NOT DETECTED NOT DETECTED Final   Enterobacteriaceae species NOT DETECTED NOT DETECTED Final   Enterobacter cloacae complex NOT DETECTED NOT DETECTED Final   Escherichia coli NOT DETECTED NOT DETECTED Final   Klebsiella oxytoca NOT DETECTED NOT DETECTED Final  Klebsiella pneumoniae NOT DETECTED NOT DETECTED Final   Proteus species NOT DETECTED NOT DETECTED Final   Serratia marcescens NOT DETECTED NOT DETECTED Final   Haemophilus influenzae NOT DETECTED NOT DETECTED Final   Neisseria meningitidis NOT DETECTED NOT DETECTED Final   Pseudomonas aeruginosa NOT DETECTED NOT DETECTED Final   Candida albicans NOT DETECTED NOT DETECTED Final   Candida glabrata NOT DETECTED NOT DETECTED Final   Candida krusei NOT DETECTED NOT DETECTED  Final   Candida parapsilosis NOT DETECTED NOT DETECTED Final   Candida tropicalis NOT DETECTED NOT DETECTED Final    Comment: Performed at [facility], [address] [place], [place] [address]  CULTURE, BLOOD (ROUTINE X 2) w Reflex to ID Panel     Status: None (Preliminary result)   Collection Time: 02/13/19  3:09 PM   Specimen: BLOOD  Result Value Ref Range Status   Specimen Description BLOOD BLOOD LEFT HAND  Final   Special Requests   Final    BOTTLES DRAWN AEROBIC AND ANAEROBIC Blood Culture adequate volume   Culture   Final    NO GROWTH < 24 HOURS Performed at [facility], [address] [place], [place] [address]    Report Status PENDING  Incomplete         Radiology Studies: MR BRAIN WO CONTRAST  Result Date: 02/12/2019 CLINICAL DATA:  Encephalopathy and ataxia EXAM: MRI HEAD WITHOUT CONTRAST TECHNIQUE: Multiplanar, multiecho pulse sequences of the brain and surrounding structures were obtained without intravenous contrast. COMPARISON:  Brain MRI 10/10/2017 FINDINGS: BRAIN: There is no acute infarct, acute hemorrhage or extra-axial collection. Multifocal white matter hyperintensity, most commonly due to chronic ischemic microangiopathy. The cerebral and cerebellar volume are age-appropriate. There is no hydrocephalus. The midline structures are normal. VASCULAR: The major intracranial arterial and venous sinus flow voids are normal. Susceptibility-sensitive sequences show no chronic microhemorrhage or superficial siderosis. SKULL AND UPPER CERVICAL SPINE: Calvarial bone marrow signal is normal. There is no skull base mass. The visualized upper cervical spine and soft tissues are normal. SINUSES/ORBITS: There are no fluid levels or advanced mucosal thickening. The mastoid air cells and middle ear cavities are free of fluid. The orbits are normal. IMPRESSION: 1. No acute intracranial abnormality. 2. Mild chronic small vessel disease. Electronically Signed   By:  Ulyses Jarred M.D.   On: 02/12/2019 22:17        Scheduled Meds: . aspirin  81 mg Per Tube Daily  . atorvastatin  40 mg Per Tube q1800  . chlorhexidine  15 mL Mouth Rinse BID  . Chlorhexidine Gluconate Cloth  6 each Topical Q0600  . Chlorhexidine Gluconate Cloth  6 each Topical Q0600  . epoetin (EPOGEN/PROCRIT) injection  4,000 Units Intravenous Once  . FLUoxetine  20 mg Per Tube Daily  . fluticasone  2 spray Each Nare Daily  . insulin aspart  0-9 Units Subcutaneous TID WC  . levothyroxine  88 mcg Per Tube QAC breakfast  . mouth rinse  15 mL Mouth Rinse q12n4p  . midodrine  10 mg Per Tube TID WC  . multivitamin  15 mL Per Tube Daily  . mupirocin ointment  1 application Nasal BID  . pantoprazole (PROTONIX) IV  40 mg Intravenous Q24H  . phosphorus  500 mg Per Tube Once   And  . phosphorus  500 mg Oral Once  . sodium chloride flush  3 mL Intravenous Once  . venlafaxine  75 mg Per Tube BID WC  . zinc sulfate  220 mg Per Tube Daily   Continuous Infusions: . sodium chloride 250 mL (02/10/19 0054)  . heparin 1,000 Units/hr (02/14/19 1212)  . norepinephrine (LEVOPHED) Adult infusion Stopped (02/12/19 0450)  . potassium chloride       LOS: 5 days    Time spent: 37mins    Kathie Dike, MD [facility]   If 7PM-7AM, please contact night-coverage [email or web address]  02/14/2019, 5:23 PM

## 2019-02-14 NOTE — Progress Notes (Signed)
PHARMACY - PHYSICIAN COMMUNICATION CRITICAL VALUE ALERT - BLOOD CULTURE IDENTIFICATION (BCID)  Jamie Arias is an 63 y.o. female who presented to [facility] on 02/09/2019   Assessment: 1/3 bottles MRSA (previous blood cultures with MRSA)  Name of physician (or Provider) Contacted: Drs. Leana Gamer  Current antibiotics: vancomycin  Changes to prescribed antibiotics recommended:  none (continue vanc)  Results for orders placed or performed during the hospital encounter of 02/09/19  Blood Culture ID Panel (Reflexed) (Collected: 02/13/2019  2:18 PM)  Result Value Ref Range   Enterococcus species NOT DETECTED NOT DETECTED   Listeria monocytogenes NOT DETECTED NOT DETECTED   Staphylococcus species DETECTED (A) NOT DETECTED   Staphylococcus aureus (BCID) DETECTED (A) NOT DETECTED   Methicillin resistance DETECTED (A) NOT DETECTED   Streptococcus species NOT DETECTED NOT DETECTED   Streptococcus agalactiae NOT DETECTED NOT DETECTED   Streptococcus pneumoniae NOT DETECTED NOT DETECTED   Streptococcus pyogenes NOT DETECTED NOT DETECTED   Acinetobacter baumannii NOT DETECTED NOT DETECTED   Enterobacteriaceae species NOT DETECTED NOT DETECTED   Enterobacter cloacae complex NOT DETECTED NOT DETECTED   Escherichia coli NOT DETECTED NOT DETECTED   Klebsiella oxytoca NOT DETECTED NOT DETECTED   Klebsiella pneumoniae NOT DETECTED NOT DETECTED   Proteus species NOT DETECTED NOT DETECTED   Serratia marcescens NOT DETECTED NOT DETECTED   Haemophilus influenzae NOT DETECTED NOT DETECTED   Neisseria meningitidis NOT DETECTED NOT DETECTED   Pseudomonas aeruginosa NOT DETECTED NOT DETECTED   Candida albicans NOT DETECTED NOT DETECTED   Candida glabrata NOT DETECTED NOT DETECTED   Candida krusei NOT DETECTED NOT DETECTED   Candida parapsilosis NOT DETECTED NOT DETECTED   Candida tropicalis NOT DETECTED NOT DETECTED    Tawnya Crook, PharmD 02/14/2019  10:49 AM

## 2019-02-14 NOTE — Consult Note (Signed)
PHARMACY CONSULT NOTE - FOLLOW UP  Pharmacy Consult for Electrolyte Monitoring and Replacement   Recent Labs: Potassium (mmol/L)  Date Value  02/14/2019 3.1 (L)  03/30/2014 4.3   Magnesium (mg/dL)  Date Value  02/14/2019 2.2  07/19/2013 1.7 (L)   Calcium (mg/dL)  Date Value  02/14/2019 8.0 (L)   Calcium, Total (mg/dL)  Date Value  03/30/2014 9.0   Albumin (g/dL)  Date Value  02/14/2019 2.2 (L)  02/11/2018 4.2  07/23/2013 2.7 (L)   Phosphorus (mg/dL)  Date Value  02/14/2019 2.2 (L)   Sodium (mmol/L)  Date Value  02/14/2019 142  03/30/2014 140     Assessment: 63 year old female with end-stage renal disease on hemodialysis, admitted to the hospital with severe septic shock secondary to MRSA bacteremia and electrolyte abnormalities.    Pharmacy has been consulted to monitor and replenish electrolytes.  K = 3.1 and Phos = 2.2 - Magnesium wnl's  Goal of Therapy:  Electrolyte's wnl's  Plan:  Will replenish potassium with IV KCl 51meq x 4 and give 500mg  KPhos x 2 to replenish phosphorous.  Will monitor carefully and recheck K, Phos, and Mag with am labs per renal pt.  Lu Duffel ,PharmD Clinical Pharmacist 02/14/2019 3:44 PM

## 2019-02-14 NOTE — Progress Notes (Signed)
HD Tx canceled Unable to access Patient AVF due to probable stenosis and veery weak thrill bruit present. Nephrologist is notified and reccommended Dialysis on Monday Dec14,2020.

## 2019-02-15 LAB — GLUCOSE, CAPILLARY
Glucose-Capillary: 101 mg/dL — ABNORMAL HIGH (ref 70–99)
Glucose-Capillary: 97 mg/dL (ref 70–99)
Glucose-Capillary: 210 mg/dL — ABNORMAL HIGH (ref 70–99)
Glucose-Capillary: 150 mg/dL — ABNORMAL HIGH (ref 70–99)

## 2019-02-15 LAB — BASIC METABOLIC PANEL
Anion gap: 12 (ref 5–15)
BUN: 26 mg/dL — ABNORMAL HIGH (ref 8–23)
CO2: 24 mmol/L (ref 22–32)
Calcium: 7.9 mg/dL — ABNORMAL LOW (ref 8.9–10.3)
Chloride: 102 mmol/L (ref 98–111)
Creatinine, Ser: 4.64 mg/dL — ABNORMAL HIGH (ref 0.44–1.00)
GFR calc Af Amer: 11 mL/min — ABNORMAL LOW (ref >60)
GFR calc non Af Amer: 9 mL/min — ABNORMAL LOW (ref >60)
Glucose, Bld: 96 mg/dL (ref 70–99)
Potassium: 3.6 mmol/L (ref 3.5–5.1)
Sodium: 138 mmol/L (ref 135–145)

## 2019-02-15 LAB — PHOSPHORUS: Phosphorus: 2.2 mg/dL — ABNORMAL LOW (ref 2.5–4.6)

## 2019-02-15 LAB — CBC
HCT: 20 % — ABNORMAL LOW (ref 36.0–46.0)
Hemoglobin: 6.8 g/dL — ABNORMAL LOW (ref 12.0–15.0)
MCH: 27.3 pg (ref 26.0–34.0)
MCHC: 34 g/dL (ref 30.0–36.0)
MCV: 80.3 fL (ref 80.0–100.0)
Platelets: 105 10*3/uL — ABNORMAL LOW (ref 150–400)
RBC: 2.49 MIL/uL — ABNORMAL LOW (ref 3.87–5.11)
RDW: 19 % — ABNORMAL HIGH (ref 11.5–15.5)
WBC: 8.9 10*3/uL (ref 4.0–10.5)
nRBC: 0 % (ref 0.0–0.2)

## 2019-02-15 LAB — MAGNESIUM: Magnesium: 2.2 mg/dL (ref 1.7–2.4)

## 2019-02-15 LAB — HEPARIN LEVEL (UNFRACTIONATED): Heparin Unfractionated: 0.3 IU/mL (ref 0.30–0.70)

## 2019-02-15 LAB — APTT: aPTT: >160 seconds (ref 24–36)

## 2019-02-15 LAB — PREPARE RBC (CROSSMATCH)

## 2019-02-15 LAB — HEMOGLOBIN AND HEMATOCRIT, BLOOD
HCT: 25 % — ABNORMAL LOW (ref 36.0–46.0)
Hemoglobin: 8.8 g/dL — ABNORMAL LOW (ref 12.0–15.0)

## 2019-02-15 MED ORDER — EPOETIN ALFA 10000 UNIT/ML IJ SOLN
4000.0000 [IU] | INTRAMUSCULAR | Status: DC
  Administered 2019-02-16 – 2019-02-20 (×3): 4000 [IU] via INTRAVENOUS

## 2019-02-15 MED ORDER — SODIUM CHLORIDE 0.9% IV SOLUTION
Freq: Once | INTRAVENOUS | Status: AC
  Administered 2019-02-15: 10:00:00 via INTRAVENOUS

## 2019-02-15 NOTE — Progress Notes (Signed)
PROGRESS NOTE    Jamie Arias  [id number] DOB: 1955/11/12 DOA: 02/09/2019 PCP: [name], MD    Brief Narrative:  63 year old female with end-stage renal disease on hemodialysis, admitted to the hospital with severe septic shock secondary to MRSA bacteremia. She had associated metabolic encephalopathy. She is being treated with intravenous vancomycin. She is not requiring any further pressors. Encephalopathy has been slow to improve.   Assessment & Plan:   Principal Problem:   Sepsis due to methicillin resistant Staphylococcus aureus (MRSA) ([place]) Active Problems:   Diabetes mellitus, type 2 (HCC)   Hypotension   Chronic diastolic CHF (congestive heart failure) (HCC)   ESRD (end stage renal disease) (HCC)   Altered level of consciousness   Pressure injury of skin   MRSA bacteremia   Arm DVT (deep venous thromboembolism), acute, left (HCC)   Acute encephalopathy   1. Septic shock secondary to MRSA bacteremia. Currently on vancomycin. Infectious disease following. 2D echocardiogram does not show any evidence of vegetations. TEE has been requested, discussed with cardiology. Overall hemodynamics are improving. She is not requiring any vasopressor support. Etiology of bacteremia is not entirely clear.  She repeatedly has 1 out of 2 positive blood cultures.  I suspect positive cultures being drawn from her central line.  IV team has tried to place peripheral in her left arm, but due to significant edema has been unable.  Will discuss with nephrology/ID regarding the possibility of central line placement 2. End-stage renal disease on hemodialysis. Nephrology following for dialysis needs. AVF could not be cannulated during last dialysis session. Seen by vascular surgery today and it appeared that AVF was patent. Will attempt dialysis again in AM 3. Acute encephalopathy. Suspect this is related to sepsis. Overall her mental status appears to be improving. MRI of the brain did not  show any acute infarct. EEG did not show any evidence of seizures. She was noted to have twitching in her lower jaw, but her husband reports this is been present for the past several months. 4. Acute left arm DVT. Noted on venous Dopplers. Started on heparin infusion, but this was held due to oozing around central line. Potentially restart heparin in next 24 hours without bolus if hemoglobin stabilizes. 5. History of atrial fibrillation. Taking amiodarone and Eliquis prior to admission. Currently in sinus rhythm. Continue to follow heart rate. She will be anticoagulated for her venous thromboembolism. 6. Chronic diastolic congestive heart failure. Appears to have significant edema. Can hopefully remove volume with dialysis. 7. Diabetes. Continue on sliding scale insulin. Blood sugars are currently stable. 8. Acute on chronic anemia. Possibly related to blood loss from oozing around IV sites. Will transfuse 1 unit prbc. Will need to hold heparin for now.   DVT prophylaxis: Heparin infusion Code Status: Full code Family Communication: Discussed with husband at the bedside Disposition Plan: May need placement pending hospital course   Consultants:   ID  Nephrology  Procedures:   Right femoral CVC 12/8>  Right femoral art line 12/8>  Antimicrobials:   Vancomycin    Subjective: Staff reported oozing of blood around central line insertion sites. She denies any shortness of breath  Objective: Vitals:   02/15/19 0739 02/15/19 0800 02/15/19 0900 02/15/19 1000  BP: (!) 124/46 (!) 126/47 (!) 131/47   Pulse: 71 70 70 69  Resp: 17 19 13 18   Temp: 98.3 F (36.8 C)     TempSrc: Oral     SpO2: 95% 93% (!) 84% (!) 86%  Weight:      Height:        Intake/Output Summary (Last 24 hours) at 02/15/2019 1126 Last data filed at 02/15/2019 1000 Gross per 24 hour  Intake 250.12 ml  Output --  Net 250.12 ml   Filed Weights   02/10/19 1700 02/12/19 1130 02/12/19 1445  Weight: 78.1 kg 78  kg 78.2 kg    Examination:  General exam: Alert, awake, no distress Respiratory system: Clear to auscultation. Respiratory effort normal. Cardiovascular system:RRR. No murmurs, rubs, gallops. Gastrointestinal system: Abdomen is nondistended, soft and nontender. No organomegaly or masses felt. Normal bowel sounds heard. Central nervous system:  No focal neurological deficits. Tremor present in lower jaw Extremities: significant edema in upper extremities bilaterally Skin: No rashes, lesions or ulcers Psychiatry: slow to respond, awake and alert, pleasant    Data Reviewed: I have personally reviewed following labs and imaging studies  CBC: Recent Labs  Lab 02/09/19 1143 02/11/19 0825 02/12/19 0444 02/13/19 0453 02/14/19 0340 02/15/19 0545  WBC 15.8* 14.4* 8.2 6.9 8.0 8.9  NEUTROABS 14.9*  --   --   --   --   --   HGB 10.7* 8.8* 7.8* 7.9* 7.2* 6.8*  HCT 34.1* 26.5* 24.4* 24.7* 22.2* 20.0*  MCV 85.0 81.5 84.4 86.1 83.8 80.3  PLT 215 140* 131* 107* 106* [id number]*   Basic Metabolic Panel: Recent Labs  Lab 02/11/19 0825 02/12/19 0444 02/13/19 0453 02/14/19 0340 02/15/19 0545  NA 142 145 143 142 138  K 2.9* 2.6* 3.2* 3.1* 3.6  CL 101 103 103 104 102  CO2 28 30 27 26 24   GLUCOSE 173* 118* 108* 109* 96  BUN 12 16 17 23  26*  CREATININE 3.01* 3.74* 3.31* 4.03* 4.64*  CALCIUM 7.8* 8.2* 8.4* 8.0* 7.9*  MG 1.7 1.7 2.3 2.2 2.2  PHOS  --  2.0* 1.7* 2.2* 2.2*   GFR: Estimated Creatinine Clearance: 12.2 mL/min (A) (by C-G formula based on SCr of 4.64 mg/dL (H)). Liver Function Tests: Recent Labs  Lab 02/09/19 1143 02/10/19 0500 02/12/19 0444 02/13/19 0453 02/14/19 0340  AST 92* 70*  --   --   --   ALT 40 35  --   --   --   ALKPHOS 105 92  --   --   --   BILITOT 1.3* 1.2  --   --   --   PROT 6.1* 5.2*  --   --   --   ALBUMIN 2.1* 1.7* 1.9* 2.2* 2.2*   Recent Labs  Lab 02/09/19 1315  LIPASE 16   Recent Labs  Lab 02/09/19 1836  AMMONIA <9*   Coagulation  Profile: Recent Labs  Lab 02/09/19 1143 02/10/19 0500  INR 1.1 1.1   Cardiac Enzymes: No results for input(s): CKTOTAL, CKMB, CKMBINDEX, TROPONINI in the last 168 hours. BNP (last 3 results) No results for input(s): PROBNP in the last 8760 hours. HbA1C: No results for input(s): HGBA1C in the last 72 hours. CBG: Recent Labs  Lab 02/14/19 0806 02/14/19 1134 02/14/19 1618 02/14/19 2221 02/15/19 0727  GLUCAP 100* 104* 125* 120* 101*   Lipid Profile: No results for input(s): CHOL, HDL, LDLCALC, TRIG, CHOLHDL, LDLDIRECT in the last 72 hours. Thyroid Function Tests: No results for input(s): TSH, T4TOTAL, FREET4, T3FREE, THYROIDAB in the last 72 hours. Anemia Panel: No results for input(s): VITAMINB12, FOLATE, FERRITIN, TIBC, IRON, RETICCTPCT in the last 72 hours. Sepsis Labs: Recent Labs  Lab 02/09/19 1438 02/09/19 1836 02/10/19 2018  LATICACIDVEN 2.6*  1.4 1.0    Recent Results (from the past 240 hour(s))  Culture, blood (routine x 2)     Status: Abnormal   Collection Time: 02/09/19  2:29 PM   Specimen: BLOOD LEFT ARM  Result Value Ref Range Status   Specimen Description   Final    BLOOD LEFT ARM Performed at [facility], [address] [place], [place] [address]    Special Requests   Final    BOTTLES DRAWN AEROBIC AND ANAEROBIC Blood Culture adequate volume Performed at [facility], [address], [place], [place] [address]    Culture  Setup Time   Final    GRAM POSITIVE COCCI IN BOTH AEROBIC AND ANAEROBIC BOTTLES CRITICAL VALUE NOTED.  VALUE IS CONSISTENT WITH PREVIOUSLY REPORTED AND CALLED VALUE. Performed at [facility], [address] [place], [place] [address]    Culture (A)  Final    STAPHYLOCOCCUS AUREUS SUSCEPTIBILITIES PERFORMED ON PREVIOUS CULTURE WITHIN THE LAST 5 DAYS. Performed at [facility], [place] [address], [place], [place] [address]    Report Status 02/12/2019 FINAL  Final  Culture, blood (routine x 2)      Status: Abnormal   Collection Time: 02/09/19  2:38 PM   Specimen: BLOOD LEFT ARM  Result Value Ref Range Status   Specimen Description   Final    BLOOD LEFT ARM Performed at [facility], [address] [place], [place] [address]    Special Requests   Final    BOTTLES DRAWN AEROBIC AND ANAEROBIC Blood Culture adequate volume Performed at [facility], [address] [place], [place] [address]    Culture  Setup Time   Final    GRAM POSITIVE COCCI IN BOTH AEROBIC AND ANAEROBIC BOTTLES CRITICAL RESULT CALLED TO, READ BACK BY AND VERIFIED WITH: [place] [id number] 02/10/2019 HNM Performed at [facility], [place] [address] [place], [place] [address]    Culture METHICILLIN RESISTANT STAPHYLOCOCCUS AUREUS (A)  Final   Report Status 02/12/2019 FINAL  Final   Organism ID, Bacteria METHICILLIN RESISTANT STAPHYLOCOCCUS AUREUS  Final      Susceptibility   Methicillin resistant staphylococcus aureus - MIC*    CIPROFLOXACIN >=8 RESISTANT Resistant     ERYTHROMYCIN >=8 RESISTANT Resistant     GENTAMICIN <=0.5 SENSITIVE Sensitive     OXACILLIN >=4 RESISTANT Resistant     TETRACYCLINE <=1 SENSITIVE Sensitive     VANCOMYCIN 1 SENSITIVE Sensitive     TRIMETH/SULFA <=10 SENSITIVE Sensitive     CLINDAMYCIN RESISTANT Resistant     RIFAMPIN <=0.5 SENSITIVE Sensitive     Inducible Clindamycin POSITIVE Resistant     * METHICILLIN RESISTANT STAPHYLOCOCCUS AUREUS  Blood Culture ID Panel (Reflexed)     Status: Abnormal   Collection Time: 02/09/19  2:38 PM  Result Value Ref Range Status   Enterococcus species NOT DETECTED NOT DETECTED Final   Listeria monocytogenes NOT DETECTED NOT DETECTED Final   Staphylococcus species DETECTED (A) NOT DETECTED Final    Comment: CRITICAL RESULT CALLED TO, READ BACK BY AND VERIFIED WITH: Hart Robinsons PHARMD [id number] 02/10/2019 HNM    Staphylococcus aureus (BCID) DETECTED (A) NOT DETECTED Final    Comment: Methicillin (oxacillin)-resistant  Staphylococcus aureus (MRSA). MRSA is predictably resistant to beta-lactam antibiotics (except ceftaroline). Preferred therapy is vancomycin unless clinically contraindicated. Patient requires contact precautions if  hospitalized. CRITICAL RESULT CALLED TO, READ BACK BY AND VERIFIED WITH: Hart Robinsons PHARMD [id number] 02/10/2019 HNM  Methicillin resistance DETECTED (A) NOT DETECTED Final    Comment: CRITICAL RESULT CALLED TO, READ BACK BY AND VERIFIED WITH: Hart Robinsons PHARMD [id number] 02/10/2019 HNM    Streptococcus species NOT DETECTED NOT DETECTED Final   Streptococcus agalactiae NOT DETECTED NOT DETECTED Final   Streptococcus pneumoniae NOT DETECTED NOT DETECTED Final   Streptococcus pyogenes NOT DETECTED NOT DETECTED Final   Acinetobacter baumannii NOT DETECTED NOT DETECTED Final   Enterobacteriaceae species NOT DETECTED NOT DETECTED Final   Enterobacter cloacae complex NOT DETECTED NOT DETECTED Final   Escherichia coli NOT DETECTED NOT DETECTED Final   Klebsiella oxytoca NOT DETECTED NOT DETECTED Final   Klebsiella pneumoniae NOT DETECTED NOT DETECTED Final   Proteus species NOT DETECTED NOT DETECTED Final   Serratia marcescens NOT DETECTED NOT DETECTED Final   Haemophilus influenzae NOT DETECTED NOT DETECTED Final   Neisseria meningitidis NOT DETECTED NOT DETECTED Final   Pseudomonas aeruginosa NOT DETECTED NOT DETECTED Final   Candida albicans NOT DETECTED NOT DETECTED Final   Candida glabrata NOT DETECTED NOT DETECTED Final   Candida krusei NOT DETECTED NOT DETECTED Final   Candida parapsilosis NOT DETECTED NOT DETECTED Final   Candida tropicalis NOT DETECTED NOT DETECTED Final    Comment: Performed at [facility], [place], [place] [address]  SARS CORONAVIRUS 2 (TAT 6-24 HRS) Nasopharyngeal Nasopharyngeal Swab     Status: None   Collection Time: 02/09/19  2:54 PM   Specimen: Nasopharyngeal Swab  Result Value Ref Range Status   SARS Coronavirus 2 NEGATIVE  NEGATIVE Final    Comment: (NOTE) SARS-CoV-2 target nucleic acids are NOT DETECTED. The SARS-CoV-2 RNA is generally detectable in upper and lower respiratory specimens during the acute phase of infection. Negative results do not preclude SARS-CoV-2 infection, do not rule out co-infections with other pathogens, and should not be used as the sole basis for treatment or other patient management decisions. Negative results must be combined with clinical observations, patient history, and epidemiological information. The expected result is Negative. Fact Sheet for Patients: SugarRoll.be Fact Sheet for Healthcare Providers: https://www.woods-mathews.com/ This test is not yet approved or cleared by the [place] FDA and  has been authorized for detection and/or diagnosis of SARS-CoV-2 by FDA under an Emergency Use Authorization (EUA). This EUA will remain  in effect (meaning this test can be used) for the duration of the COVID-19 declaration under Section 56 4(b)(1) of the Act, 21 U.S.C. section 360bbb-3(b)(1), unless the authorization is terminated or revoked sooner. Performed at [facility], [place] [address], [place], [place] [address]   MRSA PCR Screening     Status: Abnormal   Collection Time: 02/10/19  5:03 PM   Specimen: Nasopharyngeal  Result Value Ref Range Status   MRSA by PCR POSITIVE (A) NEGATIVE Final    Comment:        The GeneXpert MRSA Assay (FDA approved for NASAL specimens only), is one component of a comprehensive MRSA colonization surveillance program. It is not intended to diagnose MRSA infection nor to guide or monitor treatment for MRSA infections. RESULT CALLED TO, READ BACK BY AND VERIFIED WITH: ALEKSEY FRASER @1839  02/10/19 MJU Performed at [facility], [address] [place], [place] [address]   Culture, blood (routine x 2)     Status: Abnormal   Collection Time: 02/11/19 12:31 PM   Specimen:  BLOOD  Result Value Ref Range Status   Specimen Description   Final    BLOOD PORTA CATH Performed  at [facility], [place] [address], [place], [place] [address]    Special Requests   Final    BOTTLES DRAWN AEROBIC AND ANAEROBIC Blood Culture adequate volume Performed at [facility], [address] [place], [place] [address]    Culture  Setup Time   Final    GRAM POSITIVE COCCI ANAEROBIC BOTTLE ONLY CRITICAL RESULT CALLED TO, READ BACK BY AND VERIFIED WITH: SCOTT HALL AT [id number] 02/12/2019 [facility]  Performed at [facility], [address] [place], [place] [address]    Culture (A)  Final    STAPHYLOCOCCUS AUREUS SUSCEPTIBILITIES PERFORMED ON PREVIOUS CULTURE WITHIN THE LAST 5 DAYS. Performed at [facility], [place] [address], [place], [place] [address]    Report Status 02/13/2019 FINAL  Final  Culture, blood (routine x 2)     Status: None (Preliminary result)   Collection Time: 02/11/19  3:57 PM   Specimen: BLOOD  Result Value Ref Range Status   Specimen Description BLOOD PORTA CATH  Final   Special Requests   Final    BOTTLES DRAWN AEROBIC AND ANAEROBIC Blood Culture adequate volume   Culture   Final    NO GROWTH 4 DAYS Performed at [facility], [address], [place], [place] [address]    Report Status PENDING  Incomplete  CULTURE, BLOOD (ROUTINE X 2) w Reflex to ID Panel     Status: Abnormal (Preliminary result)   Collection Time: 02/13/19  2:18 PM   Specimen: BLOOD LEFT HAND  Result Value Ref Range Status   Specimen Description BLOOD LEFT HAND  Final   Special Requests   Final    BOTTLES DRAWN AEROBIC AND ANAEROBIC Blood Culture results may not be optimal due to an inadequate volume of blood received in culture bottles   Culture  Setup Time   Final    AEROBIC BOTTLE ONLY GRAM POSITIVE COCCI IN CLUSTERS CRITICAL RESULT CALLED TO, READ BACK BY AND VERIFIED WITH: Herbert Pun AT [id number] ON 02/14/2019 [place].    Culture (A)  Final     STAPHYLOCOCCUS AUREUS SUSCEPTIBILITIES TO FOLLOW Performed at [facility], [place] [address] [place],  [address]    Report Status PENDING  Incomplete  Blood Culture ID Panel (Reflexed)     Status: Abnormal   Collection Time: 02/13/19  2:18 PM  Result Value Ref Range Status   Enterococcus species NOT DETECTED NOT DETECTED Final   Listeria monocytogenes NOT DETECTED NOT DETECTED Final   Staphylococcus species DETECTED (A) NOT DETECTED Final    Comment: CRITICAL RESULT CALLED TO, READ BACK BY AND VERIFIED WITH: Herbert Pun AT [id number] ON 02/14/2019 [place].    Staphylococcus aureus (BCID) DETECTED (A) NOT DETECTED Final    Comment: Methicillin (oxacillin)-resistant Staphylococcus aureus (MRSA). MRSA is predictably resistant to beta-lactam antibiotics (except ceftaroline). Preferred therapy is vancomycin unless clinically contraindicated. Patient requires contact precautions if  hospitalized. CRITICAL RESULT CALLED TO, READ BACK BY AND VERIFIED WITH: Herbert Pun AT [id number] ON 02/14/2019 [place].    Methicillin resistance DETECTED (A) NOT DETECTED Final    Comment: CRITICAL RESULT CALLED TO, READ BACK BY AND VERIFIED WITH: Herbert Pun AT [id number] ON 02/14/2019 [place].    Streptococcus species NOT DETECTED NOT DETECTED Final   Streptococcus agalactiae NOT DETECTED NOT DETECTED Final   Streptococcus pneumoniae NOT DETECTED NOT DETECTED Final   Streptococcus pyogenes NOT DETECTED NOT DETECTED Final   Acinetobacter baumannii NOT DETECTED NOT DETECTED Final   Enterobacteriaceae species NOT  DETECTED NOT DETECTED Final   Enterobacter cloacae complex NOT DETECTED NOT DETECTED Final   Escherichia coli NOT DETECTED NOT DETECTED Final   Klebsiella oxytoca NOT DETECTED NOT DETECTED Final   Klebsiella pneumoniae NOT DETECTED NOT DETECTED Final   Proteus species NOT DETECTED NOT DETECTED Final   Serratia marcescens NOT DETECTED NOT DETECTED Final   Haemophilus influenzae NOT DETECTED NOT DETECTED  Final   Neisseria meningitidis NOT DETECTED NOT DETECTED Final   Pseudomonas aeruginosa NOT DETECTED NOT DETECTED Final   Candida albicans NOT DETECTED NOT DETECTED Final   Candida glabrata NOT DETECTED NOT DETECTED Final   Candida krusei NOT DETECTED NOT DETECTED Final   Candida parapsilosis NOT DETECTED NOT DETECTED Final   Candida tropicalis NOT DETECTED NOT DETECTED Final    Comment: Performed at [facility], [address] [place], [place] [address]  CULTURE, BLOOD (ROUTINE X 2) w Reflex to ID Panel     Status: None (Preliminary result)   Collection Time: 02/13/19  3:09 PM   Specimen: BLOOD  Result Value Ref Range Status   Specimen Description BLOOD BLOOD LEFT HAND  Final   Special Requests   Final    BOTTLES DRAWN AEROBIC AND ANAEROBIC Blood Culture adequate volume   Culture   Final    NO GROWTH 2 DAYS Performed at [facility], [address] [place], [place] [address]    Report Status PENDING  Incomplete         Radiology Studies: No results found.      Scheduled Meds: . aspirin  81 mg Per Tube Daily  . atorvastatin  40 mg Per Tube q1800  . chlorhexidine  15 mL Mouth Rinse BID  . Chlorhexidine Gluconate Cloth  6 each Topical Q0600  . Chlorhexidine Gluconate Cloth  6 each Topical Q0600  . epoetin (EPOGEN/PROCRIT) injection  4,000 Units Intravenous Once  . FLUoxetine  20 mg Per Tube Daily  . fluticasone  2 spray Each Nare Daily  . insulin aspart  0-9 Units Subcutaneous TID WC  . levothyroxine  88 mcg Per Tube QAC breakfast  . mouth rinse  15 mL Mouth Rinse q12n4p  . midodrine  10 mg Per Tube TID WC  . multivitamin  15 mL Per Tube Daily  . mupirocin ointment  1 application Nasal BID  . pantoprazole (PROTONIX) IV  40 mg Intravenous Q24H  . phosphorus  500 mg Per Tube Once  . sodium chloride flush  3 mL Intravenous Once  . venlafaxine  75 mg Per Tube BID WC  . zinc sulfate  220 mg Per Tube Daily   Continuous Infusions: . sodium  chloride 250 mL (02/10/19 0054)  . heparin    . norepinephrine (LEVOPHED) Adult infusion Stopped (02/12/19 0450)     LOS: 6 days    Time spent: 93mins    Kathie Dike, MD [facility]   If 7PM-7AM, please contact night-coverage [email or web address]  02/15/2019, 11:26 AM

## 2019-02-15 NOTE — Progress Notes (Signed)
[place], [place] 02/15/19  Subjective:   Hospital day # 6 Patient presented to the emergency room via EMS for abdominal pain and altered mental status.  Work-up so far shows negative ammonia, mildly elevated troponin, negative for alcohol.      Neuro: Came in with altered mental status and confusion.  Still has dystonic jaw movements, but more awake during and interactive.  Able to follow simple commands.  Husband is in the room with her today cvs: Mouth appears still dry.  No pressors Pulm: No complaints of shortness of breath gi: No nausea or vomiting reported. No oral intake- too lethargic.  We will try clears later today Renal:  Last HD on Thursday; potassium low but improving.  Dialysis could not be performed on Saturday due to difficulty with access cannulation. ID: MRSA bacteremia; isolation    Objective:  Vital signs in last 24 hours:  Temp:  [97.9 F (36.6 C)-98.9 F (37.2 C)] 98.2 F (36.8 C) (12/13 1304) Pulse Rate:  [69-74] 71 (12/13 1304) Resp:  [11-59] 15 (12/13 1304) BP: (107-158)/(36-77) 114/57 (12/13 1304) SpO2:  [84 %-100 %] 100 % (12/13 1304)  Weight change:  Filed Weights   02/10/19 1700 02/12/19 1130 02/12/19 1445  Weight: 78.1 kg 78 kg 78.2 kg    Intake/Output:    Intake/Output Summary (Last 24 hours) at 02/15/2019 1315 Last data filed at 02/15/2019 1215 Gross per 24 hour  Intake 295.12 ml  Output --  Net 295.12 ml     Physical Exam: General:  Critically ill-appearing, lying in the bed  HEENT  dry oral mucous membranes  Pulm/lungs  shallow breathing effort,  Clear to auscultation  CVS/Heart  regular rhythm in 60's  Abdomen:   Soft, nontender  Extremities:  b/l arm dependent edema  Neurologic:  More alert, interactive, able to answer questions, disoriented  Skin:  Warm  Access:  AV fistula, right arm       Basic Metabolic Panel:  Recent Labs  Lab 02/11/19 0825 02/12/19 0444 02/13/19 0453  02/14/19 0340 02/15/19 0545  NA 142 145 143 142 138  K 2.9* 2.6* 3.2* 3.1* 3.6  CL 101 103 103 104 102  CO2 28 30 27 26 24   GLUCOSE 173* 118* 108* 109* 96  BUN 12 16 17 23  26*  CREATININE 3.01* 3.74* 3.31* 4.03* 4.64*  CALCIUM 7.8* 8.2* 8.4* 8.0* 7.9*  MG 1.7 1.7 2.3 2.2 2.2  PHOS  --  2.0* 1.7* 2.2* 2.2*     CBC: Recent Labs  Lab 02/09/19 1143 02/11/19 0825 02/12/19 0444 02/13/19 0453 02/14/19 0340 02/15/19 0545  WBC 15.8* 14.4* 8.2 6.9 8.0 8.9  NEUTROABS 14.9*  --   --   --   --   --   HGB 10.7* 8.8* 7.8* 7.9* 7.2* 6.8*  HCT 34.1* 26.5* 24.4* 24.7* 22.2* 20.0*  MCV 85.0 81.5 84.4 86.1 83.8 80.3  PLT 215 140* 131* 107* 106* 105*      Lab Results  Component Value Date   HEPBSAG NON REACTIVE 02/10/2019      Microbiology:  Recent Results (from the past 240 hour(s))  Culture, blood (routine x 2)     Status: Abnormal   Collection Time: 02/09/19  2:29 PM   Specimen: BLOOD LEFT ARM  Result Value Ref Range Status   Specimen Description   Final    BLOOD LEFT ARM Performed at [facility] [place], [address], [place], [place] [address]    Special Requests  Final    BOTTLES DRAWN AEROBIC AND ANAEROBIC Blood Culture adequate volume Performed at [facility], [address] [place], [place] [address]    Culture  Setup Time   Final    GRAM POSITIVE COCCI IN BOTH AEROBIC AND ANAEROBIC BOTTLES CRITICAL VALUE NOTED.  VALUE IS CONSISTENT WITH PREVIOUSLY REPORTED AND CALLED VALUE. Performed at [facility], [address] [place], [place] [address]    Culture (A)  Final    STAPHYLOCOCCUS AUREUS SUSCEPTIBILITIES PERFORMED ON PREVIOUS CULTURE WITHIN THE LAST 5 DAYS. Performed at [facility], [place] [address] [place], [place] [address]    Report Status 02/12/2019 FINAL  Final  Culture, blood (routine x 2)     Status: Abnormal   Collection Time: 02/09/19  2:38 PM   Specimen: BLOOD LEFT ARM  Result Value Ref Range Status   Specimen  Description   Final    BLOOD LEFT ARM Performed at [facility], [address] [place], [place] [address]    Special Requests   Final    BOTTLES DRAWN AEROBIC AND ANAEROBIC Blood Culture adequate volume Performed at [facility], [address] [place],  [address]    Culture  Setup Time   Final    GRAM POSITIVE COCCI IN BOTH AEROBIC AND ANAEROBIC BOTTLES CRITICAL RESULT CALLED TO, READ BACK BY AND VERIFIED WITH: [place] [id number] 02/10/2019 HNM Performed at [facility], [place] [address], [place],  [address]    Culture METHICILLIN RESISTANT STAPHYLOCOCCUS AUREUS (A)  Final   Report Status 02/12/2019 FINAL  Final   Organism ID, Bacteria METHICILLIN RESISTANT STAPHYLOCOCCUS AUREUS  Final      Susceptibility   Methicillin resistant staphylococcus aureus - MIC*    CIPROFLOXACIN >=8 RESISTANT Resistant     ERYTHROMYCIN >=8 RESISTANT Resistant     GENTAMICIN <=0.5 SENSITIVE Sensitive     OXACILLIN >=4 RESISTANT Resistant     TETRACYCLINE <=1 SENSITIVE Sensitive     VANCOMYCIN 1 SENSITIVE Sensitive     TRIMETH/SULFA <=10 SENSITIVE Sensitive     CLINDAMYCIN RESISTANT Resistant     RIFAMPIN <=0.5 SENSITIVE Sensitive     Inducible Clindamycin POSITIVE Resistant     * METHICILLIN RESISTANT STAPHYLOCOCCUS AUREUS  Blood Culture ID Panel (Reflexed)     Status: Abnormal   Collection Time: 02/09/19  2:38 PM  Result Value Ref Range Status   Enterococcus species NOT DETECTED NOT DETECTED Final   Listeria monocytogenes NOT DETECTED NOT DETECTED Final   Staphylococcus species DETECTED (A) NOT DETECTED Final    Comment: CRITICAL RESULT CALLED TO, READ BACK BY AND VERIFIED WITH: Hart Robinsons PHARMD [id number] 02/10/2019 HNM    Staphylococcus aureus (BCID) DETECTED (A) NOT DETECTED Final    Comment: Methicillin (oxacillin)-resistant Staphylococcus aureus (MRSA). MRSA is predictably resistant to beta-lactam antibiotics (except ceftaroline). Preferred therapy is  vancomycin unless clinically contraindicated. Patient requires contact precautions if  hospitalized. CRITICAL RESULT CALLED TO, READ BACK BY AND VERIFIED WITH: Hart Robinsons PHARMD [id number] 02/10/2019 HNM    Methicillin resistance DETECTED (A) NOT DETECTED Final    Comment: CRITICAL RESULT CALLED TO, READ BACK BY AND VERIFIED WITH: Hart Robinsons PHARMD [id number] 02/10/2019 HNM    Streptococcus species NOT DETECTED NOT DETECTED Final   Streptococcus agalactiae NOT DETECTED NOT DETECTED Final   Streptococcus pneumoniae NOT DETECTED NOT DETECTED Final   Streptococcus pyogenes NOT DETECTED NOT DETECTED Final   Acinetobacter baumannii NOT DETECTED NOT DETECTED Final   Enterobacteriaceae  species NOT DETECTED NOT DETECTED Final   Enterobacter cloacae complex NOT DETECTED NOT DETECTED Final   Escherichia coli NOT DETECTED NOT DETECTED Final   Klebsiella oxytoca NOT DETECTED NOT DETECTED Final   Klebsiella pneumoniae NOT DETECTED NOT DETECTED Final   Proteus species NOT DETECTED NOT DETECTED Final   Serratia marcescens NOT DETECTED NOT DETECTED Final   Haemophilus influenzae NOT DETECTED NOT DETECTED Final   Neisseria meningitidis NOT DETECTED NOT DETECTED Final   Pseudomonas aeruginosa NOT DETECTED NOT DETECTED Final   Candida albicans NOT DETECTED NOT DETECTED Final   Candida glabrata NOT DETECTED NOT DETECTED Final   Candida krusei NOT DETECTED NOT DETECTED Final   Candida parapsilosis NOT DETECTED NOT DETECTED Final   Candida tropicalis NOT DETECTED NOT DETECTED Final    Comment: Performed at [facility], [place], [place] [address]  SARS CORONAVIRUS 2 (TAT 6-24 HRS) Nasopharyngeal Nasopharyngeal Swab     Status: None   Collection Time: 02/09/19  2:54 PM   Specimen: Nasopharyngeal Swab  Result Value Ref Range Status   SARS Coronavirus 2 NEGATIVE NEGATIVE Final    Comment: (NOTE) SARS-CoV-2 target nucleic acids are NOT DETECTED. The SARS-CoV-2 RNA is generally detectable in  upper and lower respiratory specimens during the acute phase of infection. Negative results do not preclude SARS-CoV-2 infection, do not rule out co-infections with other pathogens, and should not be used as the sole basis for treatment or other patient management decisions. Negative results must be combined with clinical observations, patient history, and epidemiological information. The expected result is Negative. Fact Sheet for Patients: SugarRoll.be Fact Sheet for Healthcare Providers: https://www.woods-mathews.com/ This test is not yet approved or cleared by the [place] FDA and  has been authorized for detection and/or diagnosis of SARS-CoV-2 by FDA under an Emergency Use Authorization (EUA). This EUA will remain  in effect (meaning this test can be used) for the duration of the COVID-19 declaration under Section 56 4(b)(1) of the Act, 21 U.S.C. section 360bbb-3(b)(1), unless the authorization is terminated or revoked sooner. Performed at [facility], [place] [address], [place], [place] [address]   MRSA PCR Screening     Status: Abnormal   Collection Time: 02/10/19  5:03 PM   Specimen: Nasopharyngeal  Result Value Ref Range Status   MRSA by PCR POSITIVE (A) NEGATIVE Final    Comment:        The GeneXpert MRSA Assay (FDA approved for NASAL specimens only), is one component of a comprehensive MRSA colonization surveillance program. It is not intended to diagnose MRSA infection nor to guide or monitor treatment for MRSA infections. RESULT CALLED TO, READ BACK BY AND VERIFIED WITH: ALEKSEY FRASER @1839  02/10/19 MJU Performed at [facility], [address] [place], [place] [address]   Culture, blood (routine x 2)     Status: Abnormal   Collection Time: 02/11/19 12:31 PM   Specimen: BLOOD  Result Value Ref Range Status   Specimen Description   Final    BLOOD PORTA CATH Performed at [place] [address], [place], [place] [address]    Special Requests   Final    BOTTLES DRAWN AEROBIC AND ANAEROBIC Blood Culture adequate volume Performed at [facility], [address] [place], [place] [address]    Culture  Setup Time   Final    GRAM POSITIVE COCCI ANAEROBIC BOTTLE ONLY CRITICAL RESULT CALLED TO, READ BACK BY AND VERIFIED WITH: [place] AT  [id number] 02/12/2019 SNG  Performed at [facility], [address] [place], [place] [address]    Culture (A)  Final    STAPHYLOCOCCUS AUREUS SUSCEPTIBILITIES PERFORMED ON PREVIOUS CULTURE WITHIN THE LAST 5 DAYS. Performed at [facility], [place] [address], [place], [place] [address]    Report Status 02/13/2019 FINAL  Final  Culture, blood (routine x 2)     Status: None (Preliminary result)   Collection Time: 02/11/19  3:57 PM   Specimen: BLOOD  Result Value Ref Range Status   Specimen Description BLOOD PORTA CATH  Final   Special Requests   Final    BOTTLES DRAWN AEROBIC AND ANAEROBIC Blood Culture adequate volume   Culture   Final    NO GROWTH 4 DAYS Performed at [facility], [address], [place], [place] [address]    Report Status PENDING  Incomplete  CULTURE, BLOOD (ROUTINE X 2) w Reflex to ID Panel     Status: Abnormal (Preliminary result)   Collection Time: 02/13/19  2:18 PM   Specimen: BLOOD LEFT HAND  Result Value Ref Range Status   Specimen Description BLOOD LEFT HAND  Final   Special Requests   Final    BOTTLES DRAWN AEROBIC AND ANAEROBIC Blood Culture results may not be optimal due to an inadequate volume of blood received in culture bottles   Culture  Setup Time   Final    AEROBIC BOTTLE ONLY GRAM POSITIVE COCCI IN CLUSTERS CRITICAL RESULT CALLED TO, READ BACK BY AND VERIFIED WITH: Herbert Pun AT [id number] ON 02/14/2019 [place].    Culture (A)  Final    STAPHYLOCOCCUS AUREUS SUSCEPTIBILITIES TO FOLLOW Performed at [facility], [place] [address], [place], [place] [address]     Report Status PENDING  Incomplete  Blood Culture ID Panel (Reflexed)     Status: Abnormal   Collection Time: 02/13/19  2:18 PM  Result Value Ref Range Status   Enterococcus species NOT DETECTED NOT DETECTED Final   Listeria monocytogenes NOT DETECTED NOT DETECTED Final   Staphylococcus species DETECTED (A) NOT DETECTED Final    Comment: CRITICAL RESULT CALLED TO, READ BACK BY AND VERIFIED WITH: Herbert Pun AT [id number] ON 02/14/2019 [place].    Staphylococcus aureus (BCID) DETECTED (A) NOT DETECTED Final    Comment: Methicillin (oxacillin)-resistant Staphylococcus aureus (MRSA). MRSA is predictably resistant to beta-lactam antibiotics (except ceftaroline). Preferred therapy is vancomycin unless clinically contraindicated. Patient requires contact precautions if  hospitalized. CRITICAL RESULT CALLED TO, READ BACK BY AND VERIFIED WITH: Herbert Pun AT [id number] ON 02/14/2019 [place].    Methicillin resistance DETECTED (A) NOT DETECTED Final    Comment: CRITICAL RESULT CALLED TO, READ BACK BY AND VERIFIED WITH: Herbert Pun AT [id number] ON 02/14/2019 [place].    Streptococcus species NOT DETECTED NOT DETECTED Final   Streptococcus agalactiae NOT DETECTED NOT DETECTED Final   Streptococcus pneumoniae NOT DETECTED NOT DETECTED Final   Streptococcus pyogenes NOT DETECTED NOT DETECTED Final   Acinetobacter baumannii NOT DETECTED NOT DETECTED Final   Enterobacteriaceae species NOT DETECTED NOT DETECTED Final   Enterobacter cloacae complex NOT DETECTED NOT DETECTED Final   Escherichia coli NOT DETECTED NOT DETECTED Final   Klebsiella oxytoca NOT DETECTED NOT DETECTED Final   Klebsiella pneumoniae NOT DETECTED NOT DETECTED Final   Proteus species NOT DETECTED NOT DETECTED Final   Serratia marcescens NOT DETECTED NOT DETECTED Final   Haemophilus influenzae NOT DETECTED NOT DETECTED Final   Neisseria meningitidis NOT DETECTED NOT DETECTED  Final   Pseudomonas aeruginosa NOT DETECTED NOT DETECTED Final   Candida  albicans NOT DETECTED NOT DETECTED Final   Candida glabrata NOT DETECTED NOT DETECTED Final   Candida krusei NOT DETECTED NOT DETECTED Final   Candida parapsilosis NOT DETECTED NOT DETECTED Final   Candida tropicalis NOT DETECTED NOT DETECTED Final    Comment: Performed at [facility], [address] [place], [place] [address]  CULTURE, BLOOD (ROUTINE X 2) w Reflex to ID Panel     Status: None (Preliminary result)   Collection Time: 02/13/19  3:09 PM   Specimen: BLOOD  Result Value Ref Range Status   Specimen Description BLOOD BLOOD LEFT HAND  Final   Special Requests   Final    BOTTLES DRAWN AEROBIC AND ANAEROBIC Blood Culture adequate volume   Culture   Final    NO GROWTH 2 DAYS Performed at [facility], [address] [place], [place] [address]    Report Status PENDING  Incomplete    Coagulation Studies: No results for input(s): LABPROT, INR in the last 72 hours.  Urinalysis: No results for input(s): COLORURINE, LABSPEC, PHURINE, GLUCOSEU, HGBUR, BILIRUBINUR, KETONESUR, PROTEINUR, UROBILINOGEN, NITRITE, LEUKOCYTESUR in the last 72 hours.  Invalid input(s): APPERANCEUR    Imaging: No results found.   Medications:   . sodium chloride 250 mL (02/10/19 0054)  . heparin    . norepinephrine (LEVOPHED) Adult infusion Stopped (02/12/19 0450)   . aspirin  81 mg Per Tube Daily  . atorvastatin  40 mg Per Tube q1800  . chlorhexidine  15 mL Mouth Rinse BID  . Chlorhexidine Gluconate Cloth  6 each Topical Q0600  . Chlorhexidine Gluconate Cloth  6 each Topical Q0600  . epoetin (EPOGEN/PROCRIT) injection  4,000 Units Intravenous Once  . FLUoxetine  20 mg Per Tube Daily  . fluticasone  2 spray Each Nare Daily  . insulin aspart  0-9 Units Subcutaneous TID WC  . levothyroxine  88 mcg Per Tube QAC breakfast  . mouth rinse  15 mL Mouth Rinse q12n4p  . midodrine  10 mg Per Tube TID WC  . multivitamin  15 mL Per Tube Daily  . mupirocin ointment  1 application  Nasal BID  . pantoprazole (PROTONIX) IV  40 mg Intravenous Q24H  . phosphorus  500 mg Per Tube Once  . sodium chloride flush  3 mL Intravenous Once  . venlafaxine  75 mg Per Tube BID WC  . zinc sulfate  220 mg Per Tube Daily   sodium chloride, fentaNYL (SUBLIMAZE) injection, ondansetron (ZOFRAN) IV  Assessment/ Plan:  63 y.o.caucasian female with end-stage renal disease, atrial fibrillation, chronic diastolic congestive heart failure, CAD status post PCI, CABG,  type 2 diabetes, hypertension, hyperlipidemia, hypothyroidism, Covid positive in July 2020, recent nursing home stay in [place]  NEPHROLOGIST: Lavonia Dana MD  LOCATION: [place]: M-W-F 2nd Shift  EDW: 81.5 kg.  KIDNEY: [facility] Optiflux 180NR  LITERS PROC: liters/treatment  HD TIME: 225  ACCESS: R Upper Arm AVF  NEEDLE SIZE:  ANTICOAG: Custom Heparin 2000  BATH: 2K/2.5Ca  QB: 400 ml/min  QD: 600 ml/min   1.  End-stage renal disease - plan for dialysis on Monday - Patient still appears dry. Potassium low, will use 4 k bath - continue midodrine prior to HD   2.  Anemia of chronic kidney disease anemia of chronic kidney disease Lab Results  Component Value Date   HGB 6.8 (L) 02/15/2019  -  EPO  with HD treatments  3. SHPTH - hold binders and non essential meds  4. Altered mental status - work up in progress, likely due to sepsis -Clinically improving today  5. Sepsis - blood culture positive for MRSA from 02/09/2019  ? Source; ID team following TEE planned on Monday  6. Hypokalemia Intermittent iv replacement as needed  7. Left arm DVT Heparin infusion    LOS: 6 Ithan Touhey 12/13/20201:15 PM  [place], [place]  Note: This note was prepared with Dragon dictation. Any transcription errors are unintentional

## 2019-02-15 NOTE — Consult Note (Signed)
[place] for Electrolyte Monitoring and Replacement   Recent Labs: Potassium (mmol/L)  Date Value  02/15/2019 3.6  03/30/2014 4.3   Magnesium (mg/dL)  Date Value  02/15/2019 2.2  07/19/2013 1.7 (L)   Calcium (mg/dL)  Date Value  02/15/2019 7.9 (L)   Calcium, Total (mg/dL)  Date Value  03/30/2014 9.0   Albumin (g/dL)  Date Value  02/14/2019 2.2 (L)  02/11/2018 4.2  07/23/2013 2.7 (L)   Phosphorus (mg/dL)  Date Value  02/15/2019 2.2 (L)   Sodium (mmol/L)  Date Value  02/15/2019 138  03/30/2014 140     Assessment: 63 year old female with end-stage renal disease on hemodialysis, admitted to the hospital with severe septic shock secondary to MRSA bacteremia and electrolyte abnormalities.    Pharmacy consulted to monitor and replenish electrolytes.    Goal of Therapy:  Electrolyte's wnl's  Plan:  Patient is on dialysis. Caution replacement. Previous replacement in discussion with Nephrologist. No additional replacement indicated. Will defer electrolyte monitoring to Nephrology.  Tawnya Crook ,PharmD Clinical Pharmacist 02/15/2019 12:04 PM

## 2019-02-15 NOTE — Consult Note (Signed)
Reason for Consult: Thrombosed Right Brachiocephalic AV fistula Referring Physician: Dr. Donita Brooks Jamie Arias is an 63 y.o. female.  HPI: 63 year old female with end-stage renal disease on hemodialysis via a RIGHT Brachiocephalic AV fistula, admitted to the hospital with severe septic shock secondary to MRSA bacteremia and metabolic encephalopathy. There was difficulty cannulating the fistula yesterday with reported clotting.  Past Medical History:  Diagnosis Date  . Anemia   . Chronic diastolic CHF (congestive heart failure) ([place])    a. Due to ischemic cardiomyopathy. EF as low as 35%, improved to normal s/p CABG; b. echo 07/06/13: EF 55-60%, no RWMAs, mod TR, trivial pericardial effusion not c/w tamponade physiology;  c. 10/2015 Echo: EF 65%, Gr1 DD, triv AI, mild MR, mildly dil LA, mod TR, PASP 14mmHg.  Marland Kitchen Coronary artery disease    a. NSTEMI 06/2013; b.cath: severe three-vessel CAD w/ EF 30% & mild-mod MR; c. s/p 3 vessel CABG 07/02/13 (LIMA-LAD, SVG-OM, and SVG-RPDA);  d. 10/2015 MV: no ischemia/infarct.  . Diabetes mellitus without complication ([place])   . Diabetic neuropathy ([place])   . Dialysis patient [facility])    MWF  . ESRD (end stage renal disease) ([place])    a. 12/2015 initiated - mwf dialysis.  Marland Kitchen GERD (gastroesophageal reflux disease)   . Hyperlipidemia   . Hypertension   . Hypothyroidism   . Myocardial infarction ([place]) 2015  . Neuropathy   . Pleural effusion 2015  . Pulmonary hypertension ([place])   . Wears dentures    full lower    Past Surgical History:  Procedure Laterality Date  . A/V FISTULAGRAM Right 12/17/2016   Procedure: A/V Fistulagram;  Surgeon: Algernon Huxley, MD;  Location: [place] [facility];  Service: Cardiovascular;  Laterality: Right;  . A/V FISTULAGRAM Right 01/07/2017   Procedure: A/V Fistulagram;  Surgeon: Algernon Huxley, MD;  Location: [place] [facility];  Service: Cardiovascular;  Laterality: Right;  . A/V FISTULAGRAM Right 12/03/2017   Procedure: A/V  FISTULAGRAM;  Surgeon: Katha Cabal, MD;  Location: [place] [facility];  Service: Cardiovascular;  Laterality: Right;  . A/V SHUNT INTERVENTION N/A 02/22/2017   Procedure: A/V SHUNT INTERVENTION;  Surgeon: Algernon Huxley, MD;  Location: [place] [facility];  Service: Cardiovascular;  Laterality: N/A;  . AV FISTULA PLACEMENT Right 02/03/2016   Procedure: INSERTION OF ARTERIOVENOUS (AV) GORE-TEX GRAFT ARM ( BRACH / AXILLARY );  Surgeon: Katha Cabal, MD;  Location: [facility];  Service: Vascular;  Laterality: Right;  . CARDIAC CATHETERIZATION    . CATARACT EXTRACTION Bilateral   . CHOLECYSTECTOMY N/A 12/09/2014   Procedure: LAPAROSCOPIC CHOLECYSTECTOMY;  Surgeon: Marlyce [name], MD;  Location: [facility];  Service: General;  Laterality: N/A;  . CORONARY ARTERY BYPASS GRAFT N/A 07/02/2013   Procedure: CORONARY ARTERY BYPASS GRAFTING (CABG);  Surgeon: Ivin [name], MD;  Location: [place];  Service: Open Heart Surgery;  Laterality: N/A;  CABG x three, using left internal mammary artery and right leg greater saphenous vein harvested endoscopically  . ESOPHAGOGASTRODUODENOSCOPY (EGD) WITH PROPOFOL N/A 11/24/2015   Procedure: ESOPHAGOGASTRODUODENOSCOPY (EGD) WITH PROPOFOL;  Surgeon: [name], MD;  Location: [place];  Service: Endoscopy;  Laterality: N/A;  Diabetic - insulin  . EYE SURGERY Bilateral    Cataract Extraction with IOL  . INTRAOPERATIVE TRANSESOPHAGEAL ECHOCARDIOGRAM N/A 07/02/2013   Procedure: INTRAOPERATIVE TRANSESOPHAGEAL ECHOCARDIOGRAM;  Surgeon: Ivin [name], MD;  Location: [place];  Service: Open Heart Surgery;  Laterality: N/A;  . PERIPHERAL VASCULAR CATHETERIZATION Right  12/06/2015   Procedure: Dialysis/Perma Catheter Insertion;  Surgeon: Katha Cabal, MD;  Location: [place] [facility];  Service: Cardiovascular;  Laterality: Right;  . PERIPHERAL VASCULAR THROMBECTOMY Right 09/28/2016   Procedure: Peripheral Vascular Thrombectomy;  Surgeon: Katha Cabal, MD;  Location: [place] [facility];  Service: Cardiovascular;  Laterality: Right;  . PERIPHERAL VASCULAR THROMBECTOMY Right 05/20/2018   Procedure: PERIPHERAL VASCULAR THROMBECTOMY;  Surgeon: Katha Cabal, MD;  Location: [place] [facility];  Service: Cardiovascular;  Laterality: Right;  . PORTA CATH REMOVAL N/A 06/01/2016   Procedure: Glori [name] Cath Removal;  Surgeon: Katha Cabal, MD;  Location: [place] [facility];  Service: Cardiovascular;  Laterality: N/A;  . THORACENTESIS Left 2015    Family History  Problem Relation Age of Onset  . COPD Mother   . Cancer Mother        Lung  . Pulmonary embolism Father   . Diabetes Father   . Diabetes Paternal Grandfather   . Heart disease Maternal Grandmother   . Colon cancer Neg Hx   . Colon polyps Neg Hx   . Esophageal cancer Neg Hx   . Pancreatic cancer Neg Hx   . Liver disease Neg Hx     Social History:  reports that she has never smoked. She has never used smokeless tobacco. She reports that she does not drink alcohol or use drugs.  Allergies:  Allergies  Allergen Reactions  . Nsaids Other (See Comments)    Contraindicated due to kidney disease.  Marland Kitchen Doxycycline Other (See Comments)    tremor    Medications: I have reviewed the patient's current medications.  Results for orders placed or performed during the hospital encounter of 02/09/19 (from the past 48 hour(s))  Glucose, capillary     Status: None   Collection Time: 02/13/19 11:57 AM  Result Value Ref Range   Glucose-Capillary 92 70 - 99 mg/dL  Heparin level (unfractionated)     Status: Abnormal   Collection Time: 02/13/19  1:17 PM  Result Value Ref Range   Heparin Unfractionated 1.27 (H) 0.30 - 0.70 IU/mL    Comment: (NOTE) If heparin results are below expected values, and patient dosage has  been confirmed, suggest follow up testing of antithrombin III levels. Performed at [facility], [address] [place], [place] [address]   APTT      Status: Abnormal   Collection Time: 02/13/19  1:17 PM  Result Value Ref Range   aPTT >160 (HH) 24 - 36 seconds    Comment:        IF BASELINE aPTT IS ELEVATED, SUGGEST PATIENT RISK ASSESSMENT BE USED TO DETERMINE APPROPRIATE ANTICOAGULANT THERAPY. CRITICAL RESULT CALLED TO, READ BACK BY AND VERIFIED WITH: JAMIE DEEN @1535  ON 02/13/2019 BY FMW Performed at [facility], [address] [place],  [address]   CULTURE, BLOOD (ROUTINE X 2) w Reflex to ID Panel     Status: Abnormal (Preliminary result)   Collection Time: 02/13/19  2:18 PM   Specimen: BLOOD LEFT HAND  Result Value Ref Range   Specimen Description BLOOD LEFT HAND    Special Requests      BOTTLES DRAWN AEROBIC AND ANAEROBIC Blood Culture results may not be optimal due to an inadequate volume of blood received in culture bottles   Culture  Setup Time      AEROBIC BOTTLE ONLY GRAM POSITIVE COCCI IN CLUSTERS CRITICAL RESULT CALLED TO, READ BACK BY AND VERIFIED WITH: Herbert Pun AT  [id number] ON 02/14/2019 [place].    Culture (A)     STAPHYLOCOCCUS AUREUS SUSCEPTIBILITIES TO FOLLOW Performed at [facility], [place] [address], [place], [place] [address]    Report Status PENDING   Blood Culture ID Panel (Reflexed)     Status: Abnormal   Collection Time: 02/13/19  2:18 PM  Result Value Ref Range   Enterococcus species NOT DETECTED NOT DETECTED   Listeria monocytogenes NOT DETECTED NOT DETECTED   Staphylococcus species DETECTED (A) NOT DETECTED    Comment: CRITICAL RESULT CALLED TO, READ BACK BY AND VERIFIED WITH: Herbert Pun AT [id number] ON 02/14/2019 [place].    Staphylococcus aureus (BCID) DETECTED (A) NOT DETECTED    Comment: Methicillin (oxacillin)-resistant Staphylococcus aureus (MRSA). MRSA is predictably resistant to beta-lactam antibiotics (except ceftaroline). Preferred therapy is vancomycin unless clinically contraindicated. Patient requires contact precautions if  hospitalized. CRITICAL RESULT  CALLED TO, READ BACK BY AND VERIFIED WITH: Herbert Pun AT [id number] ON 02/14/2019 [place].    Methicillin resistance DETECTED (A) NOT DETECTED    Comment: CRITICAL RESULT CALLED TO, READ BACK BY AND VERIFIED WITH: Herbert Pun AT [id number] ON 02/14/2019 [place].    Streptococcus species NOT DETECTED NOT DETECTED   Streptococcus agalactiae NOT DETECTED NOT DETECTED   Streptococcus pneumoniae NOT DETECTED NOT DETECTED   Streptococcus pyogenes NOT DETECTED NOT DETECTED   Acinetobacter baumannii NOT DETECTED NOT DETECTED   Enterobacteriaceae species NOT DETECTED NOT DETECTED   Enterobacter cloacae complex NOT DETECTED NOT DETECTED   Escherichia coli NOT DETECTED NOT DETECTED   Klebsiella oxytoca NOT DETECTED NOT DETECTED   Klebsiella pneumoniae NOT DETECTED NOT DETECTED   Proteus species NOT DETECTED NOT DETECTED   Serratia marcescens NOT DETECTED NOT DETECTED   Haemophilus influenzae NOT DETECTED NOT DETECTED   Neisseria meningitidis NOT DETECTED NOT DETECTED   Pseudomonas aeruginosa NOT DETECTED NOT DETECTED   Candida albicans NOT DETECTED NOT DETECTED   Candida glabrata NOT DETECTED NOT DETECTED   Candida krusei NOT DETECTED NOT DETECTED   Candida parapsilosis NOT DETECTED NOT DETECTED   Candida tropicalis NOT DETECTED NOT DETECTED    Comment: Performed at [facility], [address] [place], [place] [address]  CULTURE, BLOOD (ROUTINE X 2) w Reflex to ID Panel     Status: None (Preliminary result)   Collection Time: 02/13/19  3:09 PM   Specimen: BLOOD  Result Value Ref Range   Specimen Description BLOOD BLOOD LEFT HAND    Special Requests      BOTTLES DRAWN AEROBIC AND ANAEROBIC Blood Culture adequate volume   Culture      NO GROWTH 2 DAYS Performed at [facility], [address] [place], [place] [address]    Report Status PENDING   Glucose, capillary     Status: None   Collection Time: 02/13/19  3:49 PM  Result Value Ref Range   Glucose-Capillary 94 70 - 99  mg/dL  Glucose, capillary     Status: Abnormal   Collection Time: 02/13/19 10:14 PM  Result Value Ref Range   Glucose-Capillary 100 (H) 70 - 99 mg/dL  APTT     Status: Abnormal   Collection Time: 02/13/19 11:00 PM  Result Value Ref Range   aPTT >160 (HH) 24 - 36 seconds    Comment:        IF BASELINE aPTT IS ELEVATED, SUGGEST PATIENT RISK ASSESSMENT BE USED TO DETERMINE APPROPRIATE ANTICOAGULANT THERAPY. CRITICAL RESULT CALLED TO, READ BACK BY AND VERIFIED WITH: JAMIE DEEM AT  [id number] ON 02/14/2019 [place]. Performed at [facility], [place], [place] [address]   Heparin level (unfractionated)     Status: Abnormal   Collection Time: 02/13/19 11:00 PM  Result Value Ref Range   Heparin Unfractionated 1.23 (H) 0.30 - 0.70 IU/mL    Comment: (NOTE) If heparin results are below expected values, and patient dosage has  been confirmed, suggest follow up testing of antithrombin III levels. Performed at [facility], [address] [place], [place] [address]   CBC     Status: Abnormal   Collection Time: 02/14/19  3:40 AM  Result Value Ref Range   WBC 8.0 4.0 - 10.5 K/uL   RBC 2.65 (L) 3.87 - 5.11 MIL/uL   Hemoglobin 7.2 (L) 12.0 - 15.0 g/dL   HCT 22.2 (L) 36.0 - 46.0 %   MCV 83.8 80.0 - 100.0 fL   MCH 27.2 26.0 - 34.0 pg   MCHC 32.4 30.0 - 36.0 g/dL   RDW 19.0 (H) 11.5 - 15.5 %   Platelets 106 (L) 150 - 400 K/uL    Comment: Immature Platelet Fraction may be clinically indicated, consider ordering this additional test [id number]    nRBC 0.0 0.0 - 0.2 %    Comment: Performed at [facility], [address] [place], [place] [address]  Renal function panel     Status: Abnormal   Collection Time: 02/14/19  3:40 AM  Result Value Ref Range   Sodium 142 135 - 145 mmol/L   Potassium 3.1 (L) 3.5 - 5.1 mmol/L   Chloride 104 98 - 111 mmol/L   CO2 26 22 - 32 mmol/L   Glucose, Bld 109 (H) 70 - 99 mg/dL   BUN 23 8 - 23 mg/dL   Creatinine, Ser 4.03  (H) 0.44 - 1.00 mg/dL   Calcium 8.0 (L) 8.9 - 10.3 mg/dL   Phosphorus 2.2 (L) 2.5 - 4.6 mg/dL   Albumin 2.2 (L) 3.5 - 5.0 g/dL   GFR calc non Af Amer 11 (L) >60 mL/min   GFR calc Af Amer 13 (L) >60 mL/min   Anion gap 12 5 - 15    Comment: Performed at [facility], [address], [place], [place] [address]  Magnesium     Status: None   Collection Time: 02/14/19  3:40 AM  Result Value Ref Range   Magnesium 2.2 1.7 - 2.4 mg/dL    Comment: Performed at [facility], [address] [place], [place] [address]  Glucose, capillary     Status: Abnormal   Collection Time: 02/14/19  8:06 AM  Result Value Ref Range   Glucose-Capillary 100 (H) 70 - 99 mg/dL  Glucose, capillary     Status: Abnormal   Collection Time: 02/14/19 11:34 AM  Result Value Ref Range   Glucose-Capillary 104 (H) 70 - 99 mg/dL  Glucose, capillary     Status: Abnormal   Collection Time: 02/14/19  4:18 PM  Result Value Ref Range   Glucose-Capillary 125 (H) 70 - 99 mg/dL  APTT     Status: Abnormal   Collection Time: 02/14/19  9:27 PM  Result Value Ref Range   aPTT >160 (HH) 24 - 36 seconds    Comment:        IF BASELINE aPTT IS ELEVATED, SUGGEST PATIENT RISK ASSESSMENT BE USED TO DETERMINE APPROPRIATE ANTICOAGULANT THERAPY. CRITICAL RESULT CALLED TO, READ BACK BY AND VERIFIED WITH: [facility] CAMPBELL @2303  02/14/2019 [place] Performed at [facility],  [place], [place] [address]   Glucose, capillary     Status: Abnormal   Collection Time: 02/14/19 10:21 PM  Result Value Ref Range   Glucose-Capillary 120 (H) 70 - 99 mg/dL  Heparin level (unfractionated)     Status: None   Collection Time: 02/15/19  5:45 AM  Result Value Ref Range   Heparin Unfractionated 0.30 0.30 - 0.70 IU/mL    Comment: (NOTE) If heparin results are below expected values, and patient dosage has  been confirmed, suggest follow up testing of antithrombin III levels. Performed at [facility], [address] [place], [place] [address]   CBC     Status: Abnormal   Collection Time: 02/15/19  5:45 AM  Result Value Ref Range   WBC 8.9 4.0 - 10.5 K/uL   RBC 2.49 (L) 3.87 - 5.11 MIL/uL   Hemoglobin 6.8 (L) 12.0 - 15.0 g/dL   HCT 20.0 (L) 36.0 - 46.0 %   MCV 80.3 80.0 - 100.0 fL   MCH 27.3 26.0 - 34.0 pg   MCHC 34.0 30.0 - 36.0 g/dL   RDW 19.0 (H) 11.5 - 15.5 %   Platelets 105 (L) 150 - 400 K/uL    Comment: Immature Platelet Fraction may be clinically indicated, consider ordering this additional test [id number]    nRBC 0.0 0.0 - 0.2 %    Comment: Performed at [facility], [address] [place], [place] [id number]  Basic metabolic panel     Status: Abnormal   Collection Time: 02/15/19  5:45 AM  Result Value Ref Range   Sodium 138 135 - 145 mmol/L   Potassium 3.6 3.5 - 5.1 mmol/L   Chloride 102 98 - 111 mmol/L   CO2 24 22 - 32 mmol/L   Glucose, Bld 96 70 - 99 mg/dL   BUN 26 (H) 8 - 23 mg/dL   Creatinine, Ser 4.64 (H) 0.44 - 1.00 mg/dL   Calcium 7.9 (L) 8.9 - 10.3 mg/dL   GFR calc non Af Amer 9 (L) >60 mL/min   GFR calc Af Amer 11 (L) >60 mL/min   Anion gap 12 5 - 15    Comment: Performed at [facility], [address] [place], [place] [address]  Phosphorus     Status: Abnormal   Collection Time: 02/15/19  5:45 AM  Result Value Ref Range   Phosphorus 2.2 (L) 2.5 - 4.6 mg/dL    Comment: Performed at [facility], [address], [place], [place] [address]  Magnesium     Status: None   Collection Time: 02/15/19  5:45 AM  Result Value Ref Range   Magnesium 2.2 1.7 - 2.4 mg/dL    Comment: Performed at [facility], [address] [place], [place] [address]  Glucose, capillary     Status: Abnormal   Collection Time: 02/15/19  7:27 AM  Result Value Ref Range   Glucose-Capillary 101 (H) 70 - 99 mg/dL   Comment 1 Document in Chart   APTT     Status: Abnormal   Collection Time: 02/15/19  8:05 AM  Result Value Ref Range   aPTT >160  (HH) 24 - 36 seconds    Comment:        IF BASELINE aPTT IS ELEVATED, SUGGEST PATIENT RISK ASSESSMENT BE USED TO DETERMINE APPROPRIATE ANTICOAGULANT THERAPY. CRITICAL RESULT CALLED TO, READ BACK BY AND VERIFIED WITH: [facility] ELLIOTT AT [id number] 02/15/2019.PMF Performed at [facility], [address],  [place], [place] [address]   Prepare RBC     Status: None (Preliminary result)   Collection Time: 02/15/19  9:13 AM  Result Value Ref Range   Order Confirmation PENDING     No results found.  Review of Systems  Unable to perform ROS: Other (Patient slowly resolving encephalopathy)  Respiratory: Negative for cough.    Blood pressure (!) 131/47, pulse 70, temperature 98.3 F (36.8 C), temperature source Oral, resp. rate 13, height 5\' 4"  (1.626 m), weight 78.2 kg, SpO2 (!) 84 %. Physical Exam  Nursing note and vitals reviewed. Constitutional: She appears well-developed.  Cardiovascular: Normal rate and regular rhythm.  Respiratory: Effort normal and breath sounds normal. No respiratory distress. She has no wheezes.  GI: Soft. Bowel sounds are normal. She exhibits no distension. There is no abdominal tenderness. There is no rebound.  Musculoskeletal:        General: Edema present.     Cervical back: Neck supple.     Comments: Bilateral upper extremity edema. RIGHT AV FISTULA: +thrill and + bruit throughout. Widely patent  Neurological:  Slowly resolving encephalopathy  Skin: Skin is warm.    Assessment/Plan: Concern of Thrombosed RIGHT AV Fistula  Fistula widely patent with +Thrill and +Bruit. I evaluated the fistula at bedside with doppler and did not identify evidence of occlusion.  Patient has significant upper extremity edema that may have caused difficulty in cannulation.  Keep arm elevated. Reattempt HD tomorrow as scheduled.  If difficulty at that time may consider fistulogram.  Please call for questions/concerns. Anjeli Casad A 02/15/2019, 9:42 AM

## 2019-02-15 NOTE — Progress Notes (Signed)
Asked to see pt for TEE   Hx bacteremia  Pt known to service   Hx of CAD and is s/p CABG   She had TEE in 2015 Review of records show no contraindication for TEE  Risks/benefits of procedure described to pt and husbnd They understand  Pt agrees to proceed.  Tentative plan for tomorrow but will know mre in AM   Keep NPO after MN  Jamie Carnes MD [place]

## 2019-02-15 NOTE — Consult Note (Signed)
ANTICOAGULATION CONSULT NOTE  Pharmacy Consult for heparin drip  Indication: DVT   Patient Measurements: Height: 5\' 4"  (162.6 cm) Weight: 172 lb 6.4 oz (78.2 kg) IBW/kg (Calculated) : 54.7 Heparin Dosing Weight: 71 kg  Vital Signs: Temp: 98.3 F (36.8 C) (12/13 0739) Temp Source: Oral (12/13 0739) BP: 131/47 (12/13 0900) Pulse Rate: 69 (12/13 1000)  Labs: Recent Labs    02/13/19 0453 02/13/19 1317 02/13/19 2300 02/14/19 0340 02/14/19 2127 02/15/19 0545 02/15/19 0805  HGB 7.9*  --   --  7.2*  --  6.8*  --   HCT 24.7*  --   --  22.2*  --  20.0*  --   PLT 107*  --   --  106*  --  105*  --   APTT  --  >160* >160*  --  >160*  --  >160*  HEPARINUNFRC  --  1.27* 1.23*  --   --  0.30  --   CREATININE 3.31*  --   --  4.03*  --  4.64*  --     Estimated Creatinine Clearance: 12.2 mL/min (A) (by C-G formula based on SCr of 4.64 mg/dL (H)).   Medical History: Past Medical History:  Diagnosis Date  . Anemia   . Chronic diastolic CHF (congestive heart failure) ([place])    a. Due to ischemic cardiomyopathy. EF as low as 35%, improved to normal s/p CABG; b. echo 07/06/13: EF 55-60%, no RWMAs, mod TR, trivial pericardial effusion not c/w tamponade physiology;  c. 10/2015 Echo: EF 65%, Gr1 DD, triv AI, mild MR, mildly dil LA, mod TR, PASP 26mmHg.  Marland Kitchen Coronary artery disease    a. NSTEMI 06/2013; b.cath: severe three-vessel CAD w/ EF 30% & mild-mod MR; c. s/p 3 vessel CABG 07/02/13 (LIMA-LAD, SVG-OM, and SVG-RPDA);  d. 10/2015 MV: no ischemia/infarct.  . Diabetes mellitus without complication ([place])   . Diabetic neuropathy ([place])   . Dialysis patient [facility])    MWF  . ESRD (end stage renal disease) ([place])    a. 12/2015 initiated - mwf dialysis.  Marland Kitchen GERD (gastroesophageal reflux disease)   . Hyperlipidemia   . Hypertension   . Hypothyroidism   . Myocardial infarction ([place]) 2015  . Neuropathy   . Pleural effusion 2015  . Pulmonary hypertension ([place])   . Wears dentures    full lower     Medications:  Scheduled:  . aspirin  81 mg Per Tube Daily  . atorvastatin  40 mg Per Tube q1800  . chlorhexidine  15 mL Mouth Rinse BID  . Chlorhexidine Gluconate Cloth  6 each Topical Q0600  . Chlorhexidine Gluconate Cloth  6 each Topical Q0600  . epoetin (EPOGEN/PROCRIT) injection  4,000 Units Intravenous Once  . FLUoxetine  20 mg Per Tube Daily  . fluticasone  2 spray Each Nare Daily  . insulin aspart  0-9 Units Subcutaneous TID WC  . levothyroxine  88 mcg Per Tube QAC breakfast  . mouth rinse  15 mL Mouth Rinse q12n4p  . midodrine  10 mg Per Tube TID WC  . multivitamin  15 mL Per Tube Daily  . mupirocin ointment  1 application Nasal BID  . pantoprazole (PROTONIX) IV  40 mg Intravenous Q24H  . phosphorus  500 mg Per Tube Once  . sodium chloride flush  3 mL Intravenous Once  . venlafaxine  75 mg Per Tube BID WC  . zinc sulfate  220 mg Per Tube Daily    Assessment: 63 y.o. female admitted  on 02/09/2019 with MRSA  bacteremia. She was on apixaban PTA but last dose was prior to admission (12/7). US reveals age-indeterminate short segment occlusive DVT involving the mid humeral aspect of one of the paired brachial veins. Baseline INR 1.1, H&H, PLT trending down.  Hemoglobin is 1.2 today.  Baseline APTT >160, heparin level 1.27.     Goal of Therapy:  Heparin level 0.3-0.7 units/ml aPTT 66 - 102s Monitor platelets by anticoagulation protocol: Yes   Plan:  RN called pharmacy ~ 0800. As she was drawing APTT, noted significant oozing. RN had spoken to vascular who recommended discontinuing heparin but to speak with ordering provider. Informed RN that she should call Dr. Roderic Palau as he had placed consult for heparin for new DVT. Hold heparin for now.  Per MD note, heparin on hold. Hope to restart in next 24 hr if oozing improves.  PLEASE CONTACT OR CONSULT PHARMACY IF HEPARIN TO START BACK.  Tawnya Crook, PharmD 02/15/2019,12:07 PM

## 2019-02-15 NOTE — Progress Notes (Signed)
CRITICAL CARE PROGRESS NOTE    Name: Jamie Arias MRN: [id number] DOB: 11-27-1955     LOS: 6   SUBJECTIVE FINDINGS & SIGNIFICANT EVENTS   Patient description:   Jamie Arias is an 63 y.o. female with end-stage renal disease with morbid obesity admitted to the stepdown unit ICU floor for severe septic shock secondary to MRSA bacteremia most likely source is skin infection complicated by metabolic encephalopathy   Lines / Drains: R Fem TLC PIVx2  Cultures / Sepsis markers: MRSA bacteremia  Antibiotics: vancomycin   Protocols / Consultants: Hospitalist, nephro , ID, pccm  Tests / Events: TEE pending   Overnight: Acute blood loss from right fem TLC due to coagulopathy secondary to heparin   PAST MEDICAL HISTORY   Past Medical History:  Diagnosis Date  . Anemia   . Chronic diastolic CHF (congestive heart failure) ([place])    a. Due to ischemic cardiomyopathy. EF as low as 35%, improved to normal s/p CABG; b. echo 07/06/13: EF 55-60%, no RWMAs, mod TR, trivial pericardial effusion not c/w tamponade physiology;  c. 10/2015 Echo: EF 65%, Gr1 DD, triv AI, mild MR, mildly dil LA, mod TR, PASP 36mmHg.  Marland Kitchen Coronary artery disease    a. NSTEMI 06/2013; b.cath: severe three-vessel CAD w/ EF 30% & mild-mod MR; c. s/p 3 vessel CABG 07/02/13 (LIMA-LAD, SVG-OM, and SVG-RPDA);  d. 10/2015 MV: no ischemia/infarct.  . Diabetes mellitus without complication ([place])   . Diabetic neuropathy ([place])   . Dialysis patient [facility])    MWF  . ESRD (end stage renal disease) ([place])    a. 12/2015 initiated - mwf dialysis.  Marland Kitchen GERD (gastroesophageal reflux disease)   . Hyperlipidemia   . Hypertension   . Hypothyroidism   . Myocardial infarction ([place]) 2015  . Neuropathy   . Pleural effusion 2015  . Pulmonary hypertension ([place])   .  Wears dentures    full lower     SURGICAL HISTORY   Past Surgical History:  Procedure Laterality Date  . A/V FISTULAGRAM Right 12/17/2016   Procedure: A/V Fistulagram;  Surgeon: Algernon Huxley, MD;  Location: [place] [facility];  Service: Cardiovascular;  Laterality: Right;  . A/V FISTULAGRAM Right 01/07/2017   Procedure: A/V Fistulagram;  Surgeon: Algernon Huxley, MD;  Location: [place] [facility];  Service: Cardiovascular;  Laterality: Right;  . A/V FISTULAGRAM Right 12/03/2017   Procedure: A/V FISTULAGRAM;  Surgeon: Katha Cabal, MD;  Location: [place] [facility];  Service: Cardiovascular;  Laterality: Right;  . A/V SHUNT INTERVENTION N/A 02/22/2017   Procedure: A/V SHUNT INTERVENTION;  Surgeon: Algernon Huxley, MD;  Location: [place] [facility];  Service: Cardiovascular;  Laterality: N/A;  . AV FISTULA PLACEMENT Right 02/03/2016   Procedure: INSERTION OF ARTERIOVENOUS (AV) GORE-TEX GRAFT ARM ( BRACH / AXILLARY );  Surgeon: Katha Cabal, MD;  Location: [facility];  Service: Vascular;  Laterality: Right;  . CARDIAC CATHETERIZATION    . CATARACT EXTRACTION Bilateral   . CHOLECYSTECTOMY N/A 12/09/2014   Procedure: LAPAROSCOPIC CHOLECYSTECTOMY;  Surgeon: Marlyce [name], MD;  Location: [facility];  Service: General;  Laterality: N/A;  . CORONARY ARTERY BYPASS GRAFT N/A 07/02/2013   Procedure: CORONARY ARTERY BYPASS GRAFTING (CABG);  Surgeon: Ivin [name], MD;  Location: [facility];  Service: Open Heart Surgery;  Laterality: N/A;  CABG x three, using left internal mammary artery and right leg greater saphenous vein harvested endoscopically  . ESOPHAGOGASTRODUODENOSCOPY (EGD) WITH PROPOFOL N/A 11/24/2015  Procedure: ESOPHAGOGASTRODUODENOSCOPY (EGD) WITH PROPOFOL;  Surgeon: [name], MD;  Location: [place];  Service: Endoscopy;  Laterality: N/A;  Diabetic - insulin  . EYE SURGERY Bilateral    Cataract Extraction with IOL  . INTRAOPERATIVE TRANSESOPHAGEAL ECHOCARDIOGRAM  N/A 07/02/2013   Procedure: INTRAOPERATIVE TRANSESOPHAGEAL ECHOCARDIOGRAM;  Surgeon: Ivin [name], MD;  Location: [facility];  Service: Open Heart Surgery;  Laterality: N/A;  . PERIPHERAL VASCULAR CATHETERIZATION Right 12/06/2015   Procedure: Dialysis/Perma Catheter Insertion;  Surgeon: Katha Cabal, MD;  Location: [place] [facility];  Service: Cardiovascular;  Laterality: Right;  . PERIPHERAL VASCULAR THROMBECTOMY Right 09/28/2016   Procedure: Peripheral Vascular Thrombectomy;  Surgeon: Katha Cabal, MD;  Location: [place] [facility];  Service: Cardiovascular;  Laterality: Right;  . PERIPHERAL VASCULAR THROMBECTOMY Right 05/20/2018   Procedure: PERIPHERAL VASCULAR THROMBECTOMY;  Surgeon: Katha Cabal, MD;  Location: [place] [facility];  Service: Cardiovascular;  Laterality: Right;  . PORTA CATH REMOVAL N/A 06/01/2016   Procedure: Glori [name] Cath Removal;  Surgeon: Katha Cabal, MD;  Location: [place] [facility];  Service: Cardiovascular;  Laterality: N/A;  . THORACENTESIS Left 2015     FAMILY HISTORY   Family History  Problem Relation Age of Onset  . COPD Mother   . Cancer Mother        Lung  . Pulmonary embolism Father   . Diabetes Father   . Diabetes Paternal Grandfather   . Heart disease Maternal Grandmother   . Colon cancer Neg Hx   . Colon polyps Neg Hx   . Esophageal cancer Neg Hx   . Pancreatic cancer Neg Hx   . Liver disease Neg Hx      SOCIAL HISTORY   Social History   Tobacco Use  . Smoking status: Never Smoker  . Smokeless tobacco: Never Used  Substance Use Topics  . Alcohol use: No    Alcohol/week: 0.0 standard drinks  . Drug use: No     MEDICATIONS   Current Medication:  Current Facility-Administered Medications:  .  0.9 %  sodium chloride infusion, , Intravenous, PRN, Jennye Boroughs, MD, Last Rate: 10 mL/hr at 02/10/19 0054, 250 mL at 02/10/19 0054 .  aspirin chewable tablet 81 mg, 81 mg, Per Tube, Daily, Tyler Pita, MD .   atorvastatin (LIPITOR) tablet 40 mg, 40 mg, Per Tube, q1800, Tyler Pita, MD .  chlorhexidine (PERIDEX) 0.12 % solution 15 mL, 15 mL, Mouth Rinse, BID, Mortimer Fries, Kurian, MD, 15 mL at 02/15/19 0949 .  Chlorhexidine Gluconate Cloth 2 % PADS 6 each, 6 each, Topical, Q0600, Jennye Boroughs, MD, 6 each at 02/14/19 0559 .  Chlorhexidine Gluconate Cloth 2 % PADS 6 each, 6 each, Topical, Q0600, Tyler Pita, MD .  epoetin alfa (EPOGEN) injection 4,000 Units, 4,000 Units, Intravenous, Once, Singh, Harmeet, MD .  fentaNYL (SUBLIMAZE) injection 25 mcg, 25 mcg, Intravenous, Q2H PRN, Tyler Pita, MD, 25 mcg at 02/13/19 1502 .  FLUoxetine (PROZAC) capsule 20 mg, 20 mg, Per Tube, Daily, Tyler Pita, MD .  fluticasone [facility]) 50 MCG/ACT nasal spray 2 spray, 2 spray, Each Nare, Daily, Jennye Boroughs, MD, 2 spray at 02/15/19 0950 .  heparin ADULT infusion 100 units/mL (25000 units/231mL sodium chloride 0.45%), 800 Units/hr, Intravenous, Continuous, Hall, Scott A, RPH .  insulin aspart (novoLOG) injection 0-9 Units, 0-9 Units, Subcutaneous, TID WC, Jennye Boroughs, MD, 1 Units at 02/11/19 1723 .  levothyroxine (SYNTHROID) tablet 88 mcg, 88 mcg, Per Tube, QAC  breakfast, Tyler Pita, MD .  MEDLINE mouth rinse, 15 mL, Mouth Rinse, q12n4p, Kasa, Kurian, MD, 15 mL at 02/14/19 1144 .  midodrine (PROAMATINE) tablet 10 mg, 10 mg, Per Tube, TID WC, Tyler Pita, MD, Stopped at 02/13/19 [phone] .  multivitamin liquid 15 mL, 15 mL, Per Tube, Daily, Tyler Pita, MD .  mupirocin ointment (BACTROBAN) 2 % 1 application, 1 application, Nasal, BID, Tyler Pita, MD, 1 application at [phone] .  norepinephrine (LEVOPHED) 16 mg in 265mL premix infusion, 0-40 mcg/min, Intravenous, Titrated, Flora Lipps, MD, Stopped at 02/12/19 0450 .  ondansetron (ZOFRAN) injection 4 mg, 4 mg, Intravenous, Q6H PRN, Jennye Boroughs, MD .  pantoprazole (PROTONIX) injection 40 mg, 40 mg, Intravenous, Q24H,  Dallie Piles, RPH, 40 mg at 02/14/19 1448 .  phosphorus (K PHOS NEUTRAL) tablet 500 mg, 500 mg, Per Tube, Once **AND** [COMPLETED] phosphorus (K PHOS NEUTRAL) tablet 500 mg, 500 mg, Oral, Once, Lu Duffel, RPH, 500 mg at 02/14/19 2149 .  sodium chloride flush (NS) 0.9 % injection 3 mL, 3 mL, Intravenous, Once, Jennye Boroughs, MD .  venlafaxine [facility]) tablet 75 mg, 75 mg, Per Tube, BID WC, Tyler Pita, MD, Stopped at 02/13/19 [phone] .  zinc sulfate capsule 220 mg, 220 mg, Per Tube, Daily, Tyler Pita, MD, Stopped at 02/13/19 1316    ALLERGIES   Nsaids and Doxycycline    REVIEW OF SYSTEMS     10 point ros done and is negative except as per subjective findings  PHYSICAL EXAMINATION   Vital Signs: Temp:  [98 F (36.7 C)-98.9 F (37.2 C)] 98.3 F (36.8 C) (12/13 0739) Pulse Rate:  [67-74] 69 (12/13 1000) Resp:  [13-62] 18 (12/13 1000) BP: (107-159)/(36-77) 131/47 (12/13 0900) SpO2:  [84 %-100 %] 86 % (12/13 1000)  GENERAL:slow to answer but appropriate , well nourished chronically ill apprearing HEAD: Normocephalic, atraumatic.  EYES: Pupils equal, round, reactive to light.  No scleral icterus.  MOUTH: Moist mucosal membrane. NECK: Supple. No thyromegaly. No nodules. No JVD. Right neck bruise PULMONARY:diminised bs bilaterally  CARDIOVASCULAR: S1 and S2. Regular rate and rhythm. No murmurs, rubs, or gallops.  GASTROINTESTINAL: Soft, nontender, non-distended. No masses. Positive bowel sounds. No hepatosplenomegaly.  MUSCULOSKELETAL: No swelling, clubbing, or edema.  NEUROLOGIC: Mild distress due to acute illness, mentation improved SKIN:intact,warm,dry   PERTINENT DATA     Infusions: . sodium chloride 250 mL (02/10/19 0054)  . heparin    . norepinephrine (LEVOPHED) Adult infusion Stopped (02/12/19 0450)   Scheduled Medications: . aspirin  81 mg Per Tube Daily  . atorvastatin  40 mg Per Tube q1800  . chlorhexidine  15 mL Mouth Rinse BID    . Chlorhexidine Gluconate Cloth  6 each Topical Q0600  . Chlorhexidine Gluconate Cloth  6 each Topical Q0600  . epoetin (EPOGEN/PROCRIT) injection  4,000 Units Intravenous Once  . FLUoxetine  20 mg Per Tube Daily  . fluticasone  2 spray Each Nare Daily  . insulin aspart  0-9 Units Subcutaneous TID WC  . levothyroxine  88 mcg Per Tube QAC breakfast  . mouth rinse  15 mL Mouth Rinse q12n4p  . midodrine  10 mg Per Tube TID WC  . multivitamin  15 mL Per Tube Daily  . mupirocin ointment  1 application Nasal BID  . pantoprazole (PROTONIX) IV  40 mg Intravenous Q24H  . phosphorus  500 mg Per Tube Once  . sodium chloride flush  3 mL Intravenous Once  .  venlafaxine  75 mg Per Tube BID WC  . zinc sulfate  220 mg Per Tube Daily   PRN Medications: sodium chloride, fentaNYL (SUBLIMAZE) injection, ondansetron (ZOFRAN) IV Hemodynamic parameters:   Intake/Output: 12/12 0701 - 12/13 0700 In: 37.4 [I.V.:37.4] Out: -   Ventilator  Settings:    LAB RESULTS:  Basic Metabolic Panel: Recent Labs  Lab 02/11/19 0825 02/12/19 0444 02/13/19 0453 02/14/19 0340 02/15/19 0545  NA 142 145 143 142 138  K 2.9* 2.6* 3.2* 3.1* 3.6  CL 101 103 103 104 102  CO2 28 30 27 26 24   GLUCOSE 173* 118* 108* 109* 96  BUN 12 16 17 23  26*  CREATININE 3.01* 3.74* 3.31* 4.03* 4.64*  CALCIUM 7.8* 8.2* 8.4* 8.0* 7.9*  MG 1.7 1.7 2.3 2.2 2.2  PHOS  --  2.0* 1.7* 2.2* 2.2*   Liver Function Tests: Recent Labs  Lab 02/09/19 1143 02/10/19 0500 02/12/19 0444 02/13/19 0453 02/14/19 0340  AST 92* 70*  --   --   --   ALT 40 35  --   --   --   ALKPHOS 105 92  --   --   --   BILITOT 1.3* 1.2  --   --   --   PROT 6.1* 5.2*  --   --   --   ALBUMIN 2.1* 1.7* 1.9* 2.2* 2.2*   Recent Labs  Lab 02/09/19 1315  LIPASE 16   Recent Labs  Lab 02/09/19 1836  AMMONIA <9*   CBC: Recent Labs  Lab 02/09/19 1143 02/11/19 0825 02/12/19 0444 02/13/19 0453 02/14/19 0340 02/15/19 0545  WBC 15.8* 14.4* 8.2 6.9 8.0  8.9  NEUTROABS 14.9*  --   --   --   --   --   HGB 10.7* 8.8* 7.8* 7.9* 7.2* 6.8*  HCT 34.1* 26.5* 24.4* 24.7* 22.2* 20.0*  MCV 85.0 81.5 84.4 86.1 83.8 80.3  PLT 215 140* 131* 107* 106* 105*   Cardiac Enzymes: No results for input(s): CKTOTAL, CKMB, CKMBINDEX, TROPONINI in the last 168 hours. BNP: Invalid input(s): POCBNP CBG: Recent Labs  Lab 02/14/19 0806 02/14/19 1134 02/14/19 1618 02/14/19 2221 02/15/19 0727  GLUCAP 100* 104* 125* 120* 101*     IMAGING RESULTS:     ASSESSMENT AND PLAN    -Multidisciplinary rounds held today  Acute blood loss anemia  - source is R fem venous line bleeding due to coagulopathy from heparin - hold heparin -repeat aPTT -transfuse 1 unit pRBC -monitor h/h  -monitor vitals and ICU telemonitoring   Septic shock   - due to MRSA bacteremia  - continue vanco - pharmD and ID on case - appreciate input  - TEE ordered -Discussed with cardiologist - Dr Harrington Challenger   - midodrine PO , off IV vasopressors ICU monitoring  Renal Failure - ESRD - - AVF malfunction - vascular surg consult to eval - could not access fistula yesterday - missed HD treatment -nephro on case - appreciate input  Altered mental status with lethargy - septic encephalopathy -improved  - intubated and sedated - minimal sedation to achieve a RASS goal: -1 Wake up assessment pending  Left upper extermity occlusive DVT - brachial vein - age undetermined, without extension - s/p bleeding with requirement of blood transfusion  -will stop heparin   ID -continue IV abx as prescibed -follow up cultures  GI/Nutrition GI PROPHYLAXIS as indicated DIET-->TF's as tolerated Constipation protocol as indicated  ENDO - ICU hypoglycemic\Hyperglycemia protocol -check FSBS per protocol  ELECTROLYTES -follow labs as needed -replace as needed -pharmacy consultation   DVT/GI PRX ordered -SCDs  TRANSFUSIONS AS NEEDED MONITOR FSBS ASSESS the need for LABS as  needed   Critical care provider statement:   Critical care time (minutes): 33  Critical care time was exclusive of: Separately billable procedures and  treating other patients  Critical care was necessary to treat or prevent imminent or  life-threatening deterioration of the following conditions:  Post COVID-19 , encephalitis, sepsis, MRSA bacteremia, multiple comorbid conditions  Critical care was time spent personally by me on the following  activities: Development of treatment plan with patient or surrogate,  discussions with consultants, evaluation of patient's response to  treatment, examination of patient, obtaining history from patient or  surrogate, ordering and performing treatments and interventions, ordering  and review of laboratory studies and re-evaluation of patient's condition  I assumed direction of critical care for this patient from another  provider in my specialty: no      Ottie Glazier, M.D.  Division of [place]

## 2019-02-16 ENCOUNTER — Encounter
Admission: EM | Disposition: A | Discharge status: Skilled Nursing Facility | Payer: Self-pay | Source: Home / Self Care | Attending: Internal Medicine

## 2019-02-16 DIAGNOSIS — L89620 Pressure ulcer of left heel, unstageable: Secondary | ICD-10-CM

## 2019-02-16 LAB — GLUCOSE, CAPILLARY
Glucose-Capillary: 153 mg/dL — ABNORMAL HIGH (ref 70–99)
Glucose-Capillary: 163 mg/dL — ABNORMAL HIGH (ref 70–99)
Glucose-Capillary: 78 mg/dL (ref 70–99)

## 2019-02-16 LAB — RENAL FUNCTION PANEL
Albumin: 2.1 g/dL — ABNORMAL LOW (ref 3.5–5.0)
Anion gap: 13 (ref 5–15)
BUN: 30 mg/dL — ABNORMAL HIGH (ref 8–23)
CO2: 24 mmol/L (ref 22–32)
Calcium: 8.2 mg/dL — ABNORMAL LOW (ref 8.9–10.3)
Chloride: 102 mmol/L (ref 98–111)
Creatinine, Ser: 5.3 mg/dL — ABNORMAL HIGH (ref 0.44–1.00)
GFR calc Af Amer: 9 mL/min — ABNORMAL LOW (ref >60)
GFR calc non Af Amer: 8 mL/min — ABNORMAL LOW (ref >60)
Glucose, Bld: 166 mg/dL — ABNORMAL HIGH (ref 70–99)
Phosphorus: 2.5 mg/dL (ref 2.5–4.6)
Potassium: 3.4 mmol/L — ABNORMAL LOW (ref 3.5–5.1)
Sodium: 139 mmol/L (ref 135–145)

## 2019-02-16 LAB — CULTURE, BLOOD (ROUTINE X 2)
Culture: NO GROWTH
Special Requests: ADEQUATE

## 2019-02-16 LAB — CBC
HCT: 24.7 % — ABNORMAL LOW (ref 36.0–46.0)
Hemoglobin: 8.7 g/dL — ABNORMAL LOW (ref 12.0–15.0)
MCH: 28.1 pg (ref 26.0–34.0)
MCHC: 35.2 g/dL (ref 30.0–36.0)
MCV: 79.7 fL — ABNORMAL LOW (ref 80.0–100.0)
Platelets: 126 10*3/uL — ABNORMAL LOW (ref 150–400)
RBC: 3.1 MIL/uL — ABNORMAL LOW (ref 3.87–5.11)
RDW: 17.9 % — ABNORMAL HIGH (ref 11.5–15.5)
WBC: 8.9 10*3/uL (ref 4.0–10.5)
nRBC: 0 % (ref 0.0–0.2)

## 2019-02-16 LAB — CK: Total CK: 17 U/L — ABNORMAL LOW (ref 38–234)

## 2019-02-16 LAB — TYPE AND SCREEN
ABO/RH(D): A POS
Antibody Screen: NEGATIVE
Unit division: 0

## 2019-02-16 LAB — BPAM RBC
Blood Product Expiration Date: 202101112359
ISSUE DATE / TIME: 202012131238
Unit Type and Rh: 6200

## 2019-02-16 LAB — VANCOMYCIN, RANDOM: Vancomycin Rm: 25

## 2019-02-16 LAB — MAGNESIUM: Magnesium: 1.9 mg/dL (ref 1.7–2.4)

## 2019-02-16 SURGERY — ECHOCARDIOGRAM, TRANSESOPHAGEAL
Anesthesia: Moderate Sedation

## 2019-02-16 MED ORDER — VANCOMYCIN VARIABLE DOSE PER UNSTABLE RENAL FUNCTION (PHARMACIST DOSING): Status: DC

## 2019-02-16 MED ORDER — POTASSIUM CHLORIDE 10 MEQ/50ML IV SOLN
10.0000 meq | INTRAVENOUS | Status: AC
  Administered 2019-02-16 (×2): 10 meq via INTRAVENOUS
  Filled 2019-02-16 (×2): qty 50

## 2019-02-16 MED ORDER — SODIUM CHLORIDE 0.9 % IV SOLN
300.0000 mg | Freq: Two times a day (BID) | INTRAVENOUS | Status: DC
  Administered 2019-02-16 – 2019-02-23 (×14): 300 mg via INTRAVENOUS
  Filled 2019-02-16 (×17): qty 300

## 2019-02-16 MED ORDER — SODIUM CHLORIDE 0.9 % IV SOLN
650.0000 mg | Freq: Once | INTRAVENOUS | Status: AC
  Administered 2019-02-16: 650 mg via INTRAVENOUS
  Filled 2019-02-16: qty 13

## 2019-02-16 MED ORDER — SODIUM CHLORIDE 0.9 % IV SOLN
650.0000 mg | INTRAVENOUS | Status: DC
  Administered 2019-02-18 – 2019-02-28 (×6): 650 mg via INTRAVENOUS
  Filled 2019-02-16 (×7): qty 13

## 2019-02-16 MED ORDER — HEPARIN SODIUM (PORCINE) 5000 UNIT/ML IJ SOLN
5000.0000 [IU] | Freq: Three times a day (TID) | INTRAMUSCULAR | Status: DC
  Administered 2019-02-16: 21:00:00 5000 [IU] via SUBCUTANEOUS
  Filled 2019-02-16: qty 1

## 2019-02-16 NOTE — Progress Notes (Signed)
SLP Cancellation Note  Patient Details Name: Jamie Arias MRN: [id number] DOB: 1955-08-15   Cancelled treatment:       Reason Eval/Treat Not Completed: Patient at procedure or test/unavailable(chart reviewed; consulted NSG re: pt's status). NSG reported pt is more alert/awake today; she awakened and stated name/few other verbalizations. However, pt is NPO as she goes to HD and then TEE afterward. NSG holding all po's. ST services will f/u tomorrow w/ assessment. Recommend frequent oral care for hygiene and stimulation of swallowing.     Orinda Kenner, MS, CCC-SLP [facility] 02/16/2019, 12:45 PM

## 2019-02-16 NOTE — Progress Notes (Signed)
[place], [place] 02/16/19  Subjective:  Patient persistently bacteremic. Case reviewed with infectious disease. Due for dialysis today.   Objective:  Vital signs in last 24 hours:  Temp:  [98 F (36.7 C)-98.4 F (36.9 C)] 98.1 F (36.7 C) (12/14 0800) Pulse Rate:  [59-70] 66 (12/14 0900) Resp:  [11-33] 16 (12/14 0900) BP: (132-153)/(44-57) 150/55 (12/14 0900) SpO2:  [95 %-99 %] 98 % (12/14 0900)  Weight change:  Filed Weights   02/10/19 1700 02/12/19 1130 02/12/19 1445  Weight: 78.1 kg 78 kg 78.2 kg    Intake/Output:    Intake/Output Summary (Last 24 hours) at 02/16/2019 1517 Last data filed at 02/16/2019 0800 Gross per 24 hour  Intake 1030.83 ml  Output --  Net 1030.83 ml     Physical Exam: General:  Critically ill-appearing, lying in the bed  HEENT  dry oral mucous membranes  Pulm/lungs  clear bilateral, normal effort  CVS/Heart  S1S2 no rubs  Abdomen:   Soft, nontender  Extremities:  b/l arm dependent edema  Neurologic:  Awake, alert, follows commands  Skin:  Warm  Access:  AV fistula, right arm       Basic Metabolic Panel:  Recent Labs  Lab 02/12/19 0444 02/13/19 0453 02/14/19 0340 02/15/19 0545 02/16/19 0433  NA 145 143 142 138 139  K 2.6* 3.2* 3.1* 3.6 3.4*  CL 103 103 104 102 102  CO2 30 27 26 24 24   GLUCOSE 118* 108* 109* 96 166*  BUN 16 17 23  26* 30*  CREATININE 3.74* 3.31* 4.03* 4.64* 5.30*  CALCIUM 8.2* 8.4* 8.0* 7.9* 8.2*  MG 1.7 2.3 2.2 2.2 1.9  PHOS 2.0* 1.7* 2.2* 2.2* 2.5     CBC: Recent Labs  Lab 02/12/19 0444 02/13/19 0453 02/14/19 0340 02/15/19 0545 02/15/19 1824 02/16/19 0433  WBC 8.2 6.9 8.0 8.9  --  8.9  HGB 7.8* 7.9* 7.2* 6.8* 8.8* 8.7*  HCT 24.4* 24.7* 22.2* 20.0* 25.0* 24.7*  MCV 84.4 86.1 83.8 80.3  --  79.7*  PLT 131* 107* 106* 105*  --  126*      Lab Results  Component Value Date   HEPBSAG NON REACTIVE 02/10/2019      Microbiology:  Recent Results (from the  past 240 hour(s))  Culture, blood (routine x 2)     Status: Abnormal   Collection Time: 02/09/19  2:29 PM   Specimen: BLOOD LEFT ARM  Result Value Ref Range Status   Specimen Description   Final    BLOOD LEFT ARM Performed at [facility], [address] [place], [place] [address]    Special Requests   Final    BOTTLES DRAWN AEROBIC AND ANAEROBIC Blood Culture adequate volume Performed at [facility], [address] [place], [place] [address]    Culture  Setup Time   Final    GRAM POSITIVE COCCI IN BOTH AEROBIC AND ANAEROBIC BOTTLES CRITICAL VALUE NOTED.  VALUE IS CONSISTENT WITH PREVIOUSLY REPORTED AND CALLED VALUE. Performed at [facility], [address] [place], [place] [address]    Culture (A)  Final    STAPHYLOCOCCUS AUREUS SUSCEPTIBILITIES PERFORMED ON PREVIOUS CULTURE WITHIN THE LAST 5 DAYS. Performed at [facility], [place] [address] [place], [place] [address]    Report Status 02/12/2019 FINAL  Final  Culture, blood (routine x 2)     Status: Abnormal   Collection Time: 02/09/19  2:38 PM   Specimen: BLOOD LEFT ARM  Result Value  Ref Range Status   Specimen Description   Final    BLOOD LEFT ARM Performed at [facility], [address] [place], [place] [address]    Special Requests   Final    BOTTLES DRAWN AEROBIC AND ANAEROBIC Blood Culture adequate volume Performed at [facility], [address] [place], [place] [address]    Culture  Setup Time   Final    GRAM POSITIVE COCCI IN BOTH AEROBIC AND ANAEROBIC BOTTLES CRITICAL RESULT CALLED TO, READ BACK BY AND VERIFIED WITH: [place] [id number] 02/10/2019 HNM Performed at [facility], [place] [address] [place], [place] [address]    Culture METHICILLIN RESISTANT STAPHYLOCOCCUS AUREUS (A)  Final   Report Status 02/12/2019 FINAL  Final   Organism ID, Bacteria METHICILLIN RESISTANT STAPHYLOCOCCUS AUREUS  Final      Susceptibility   Methicillin  resistant staphylococcus aureus - MIC*    CIPROFLOXACIN >=8 RESISTANT Resistant     ERYTHROMYCIN >=8 RESISTANT Resistant     GENTAMICIN <=0.5 SENSITIVE Sensitive     OXACILLIN >=4 RESISTANT Resistant     TETRACYCLINE <=1 SENSITIVE Sensitive     VANCOMYCIN 1 SENSITIVE Sensitive     TRIMETH/SULFA <=10 SENSITIVE Sensitive     CLINDAMYCIN RESISTANT Resistant     RIFAMPIN <=0.5 SENSITIVE Sensitive     Inducible Clindamycin POSITIVE Resistant     * METHICILLIN RESISTANT STAPHYLOCOCCUS AUREUS  Blood Culture ID Panel (Reflexed)     Status: Abnormal   Collection Time: 02/09/19  2:38 PM  Result Value Ref Range Status   Enterococcus species NOT DETECTED NOT DETECTED Final   Listeria monocytogenes NOT DETECTED NOT DETECTED Final   Staphylococcus species DETECTED (A) NOT DETECTED Final    Comment: CRITICAL RESULT CALLED TO, READ BACK BY AND VERIFIED WITH: Hart Robinsons PHARMD [id number] 02/10/2019 HNM    Staphylococcus aureus (BCID) DETECTED (A) NOT DETECTED Final    Comment: Methicillin (oxacillin)-resistant Staphylococcus aureus (MRSA). MRSA is predictably resistant to beta-lactam antibiotics (except ceftaroline). Preferred therapy is vancomycin unless clinically contraindicated. Patient requires contact precautions if  hospitalized. CRITICAL RESULT CALLED TO, READ BACK BY AND VERIFIED WITH: Hart Robinsons PHARMD [id number] 02/10/2019 HNM    Methicillin resistance DETECTED (A) NOT DETECTED Final    Comment: CRITICAL RESULT CALLED TO, READ BACK BY AND VERIFIED WITH: Hart Robinsons PHARMD [id number] 02/10/2019 HNM    Streptococcus species NOT DETECTED NOT DETECTED Final   Streptococcus agalactiae NOT DETECTED NOT DETECTED Final   Streptococcus pneumoniae NOT DETECTED NOT DETECTED Final   Streptococcus pyogenes NOT DETECTED NOT DETECTED Final   Acinetobacter baumannii NOT DETECTED NOT DETECTED Final   Enterobacteriaceae species NOT DETECTED NOT DETECTED Final   Enterobacter cloacae complex NOT DETECTED NOT DETECTED Final    Escherichia coli NOT DETECTED NOT DETECTED Final   Klebsiella oxytoca NOT DETECTED NOT DETECTED Final   Klebsiella pneumoniae NOT DETECTED NOT DETECTED Final   Proteus species NOT DETECTED NOT DETECTED Final   Serratia marcescens NOT DETECTED NOT DETECTED Final   Haemophilus influenzae NOT DETECTED NOT DETECTED Final   Neisseria meningitidis NOT DETECTED NOT DETECTED Final   Pseudomonas aeruginosa NOT DETECTED NOT DETECTED Final   Candida albicans NOT DETECTED NOT DETECTED Final   Candida glabrata NOT DETECTED NOT DETECTED Final   Candida krusei NOT DETECTED NOT DETECTED Final   Candida parapsilosis NOT DETECTED NOT DETECTED Final   Candida tropicalis NOT DETECTED NOT DETECTED Final    Comment: Performed at [facility], [address] [place], [place] [address]  SARS CORONAVIRUS 2 (TAT 6-24 HRS) Nasopharyngeal Nasopharyngeal Swab     Status: None   Collection Time: 02/09/19  2:54 PM   Specimen: Nasopharyngeal Swab  Result Value Ref Range Status   SARS Coronavirus 2 NEGATIVE NEGATIVE Final    Comment: (NOTE) SARS-CoV-2 target nucleic acids are NOT DETECTED. The SARS-CoV-2 RNA is generally detectable in upper and lower respiratory specimens during the acute phase of infection. Negative results do not preclude SARS-CoV-2 infection, do not rule out co-infections with other pathogens, and should not be used as the sole basis for treatment or other patient management decisions. Negative results must be combined with clinical observations, patient history, and epidemiological information. The expected result is Negative. Fact Sheet for Patients: SugarRoll.be Fact Sheet for Healthcare Providers: https://www.woods-mathews.com/ This test is not yet approved or cleared by the [place] FDA and  has been authorized for detection and/or diagnosis of SARS-CoV-2 by FDA under an Emergency Use Authorization (EUA). This EUA will remain  in  effect (meaning this test can be used) for the duration of the COVID-19 declaration under Section 56 4(b)(1) of the Act, 21 U.S.C. section 360bbb-3(b)(1), unless the authorization is terminated or revoked sooner. Performed at [facility], [place] [address] [place], [place] [address]   MRSA PCR Screening     Status: Abnormal   Collection Time: 02/10/19  5:03 PM   Specimen: Nasopharyngeal  Result Value Ref Range Status   MRSA by PCR POSITIVE (A) NEGATIVE Final    Comment:        The GeneXpert MRSA Assay (FDA approved for NASAL specimens only), is one component of a comprehensive MRSA colonization surveillance program. It is not intended to diagnose MRSA infection nor to guide or monitor treatment for MRSA infections. RESULT CALLED TO, READ BACK BY AND VERIFIED WITH: ALEKSEY FRASER @1839  02/10/19 MJU Performed at [facility], [address] [place], [place] [address]   Culture, blood (routine x 2)     Status: Abnormal   Collection Time: 02/11/19 12:31 PM   Specimen: BLOOD  Result Value Ref Range Status   Specimen Description   Final    BLOOD PORTA CATH Performed at [place] [address] [place], [place] [address]    Special Requests   Final    BOTTLES DRAWN AEROBIC AND ANAEROBIC Blood Culture adequate volume Performed at [facility], [address] [place], [place] [address]    Culture  Setup Time   Final    GRAM POSITIVE COCCI ANAEROBIC BOTTLE ONLY CRITICAL RESULT CALLED TO, READ BACK BY AND VERIFIED WITH: SCOTT HALL AT [id number] 02/12/2019 [facility]  Performed at [facility], [address] [place], [place] [address]    Culture (A)  Final    STAPHYLOCOCCUS AUREUS SUSCEPTIBILITIES PERFORMED ON PREVIOUS CULTURE WITHIN THE LAST 5 DAYS. Performed at [facility], [place] [address], [place], [place] [address]    Report Status 02/13/2019 FINAL  Final  Culture, blood (routine x 2)     Status: None   Collection Time: 02/11/19   3:57 PM   Specimen: BLOOD  Result Value Ref Range Status   Specimen Description BLOOD PORTA CATH  Final   Special Requests   Final    BOTTLES DRAWN AEROBIC AND ANAEROBIC Blood Culture adequate volume   Culture   Final    NO GROWTH 5 DAYS Performed at [facility], [address], [place],  [address]  Report Status 02/16/2019 FINAL  Final  CULTURE, BLOOD (ROUTINE X 2) w Reflex to ID Panel     Status: Abnormal   Collection Time: 02/13/19  2:18 PM   Specimen: BLOOD LEFT HAND  Result Value Ref Range Status   Specimen Description BLOOD LEFT HAND  Final   Special Requests   Final    BOTTLES DRAWN AEROBIC AND ANAEROBIC Blood Culture results may not be optimal due to an inadequate volume of blood received in culture bottles   Culture  Setup Time   Final    AEROBIC BOTTLE ONLY GRAM POSITIVE COCCI IN CLUSTERS CRITICAL RESULT CALLED TO, READ BACK BY AND VERIFIED WITH: Herbert Pun AT [id number] ON 02/14/2019 [place]. Performed at [facility], [place] [address], [place], [place] [address]    Culture METHICILLIN RESISTANT STAPHYLOCOCCUS AUREUS (A)  Final   Report Status 02/16/2019 FINAL  Final   Organism ID, Bacteria METHICILLIN RESISTANT STAPHYLOCOCCUS AUREUS  Final      Susceptibility   Methicillin resistant staphylococcus aureus - MIC*    CIPROFLOXACIN >=8 RESISTANT Resistant     ERYTHROMYCIN >=8 RESISTANT Resistant     GENTAMICIN <=0.5 SENSITIVE Sensitive     OXACILLIN >=4 RESISTANT Resistant     TETRACYCLINE <=1 SENSITIVE Sensitive     VANCOMYCIN <=0.5 SENSITIVE Sensitive     TRIMETH/SULFA <=10 SENSITIVE Sensitive     CLINDAMYCIN RESISTANT Resistant     RIFAMPIN <=0.5 SENSITIVE Sensitive     Inducible Clindamycin POSITIVE Resistant     * METHICILLIN RESISTANT STAPHYLOCOCCUS AUREUS  Blood Culture ID Panel (Reflexed)     Status: Abnormal   Collection Time: 02/13/19  2:18 PM  Result Value Ref Range Status   Enterococcus species NOT DETECTED NOT DETECTED Final    Listeria monocytogenes NOT DETECTED NOT DETECTED Final   Staphylococcus species DETECTED (A) NOT DETECTED Final    Comment: CRITICAL RESULT CALLED TO, READ BACK BY AND VERIFIED WITH: ABBEY ELLINGTON AT [id number] ON 02/14/2019 [place].    Staphylococcus aureus (BCID) DETECTED (A) NOT DETECTED Final    Comment: Methicillin (oxacillin)-resistant Staphylococcus aureus (MRSA). MRSA is predictably resistant to beta-lactam antibiotics (except ceftaroline). Preferred therapy is vancomycin unless clinically contraindicated. Patient requires contact precautions if  hospitalized. CRITICAL RESULT CALLED TO, READ BACK BY AND VERIFIED WITH: Herbert Pun AT [id number] ON 02/14/2019 [place].    Methicillin resistance DETECTED (A) NOT DETECTED Final    Comment: CRITICAL RESULT CALLED TO, READ BACK BY AND VERIFIED WITH: Herbert Pun AT [id number] ON 02/14/2019 [place].    Streptococcus species NOT DETECTED NOT DETECTED Final   Streptococcus agalactiae NOT DETECTED NOT DETECTED Final   Streptococcus pneumoniae NOT DETECTED NOT DETECTED Final   Streptococcus pyogenes NOT DETECTED NOT DETECTED Final   Acinetobacter baumannii NOT DETECTED NOT DETECTED Final   Enterobacteriaceae species NOT DETECTED NOT DETECTED Final   Enterobacter cloacae complex NOT DETECTED NOT DETECTED Final   Escherichia coli NOT DETECTED NOT DETECTED Final   Klebsiella oxytoca NOT DETECTED NOT DETECTED Final   Klebsiella pneumoniae NOT DETECTED NOT DETECTED Final   Proteus species NOT DETECTED NOT DETECTED Final   Serratia marcescens NOT DETECTED NOT DETECTED Final   Haemophilus influenzae NOT DETECTED NOT DETECTED Final   Neisseria meningitidis NOT DETECTED NOT DETECTED Final   Pseudomonas aeruginosa NOT DETECTED NOT DETECTED Final   Candida albicans NOT DETECTED NOT DETECTED Final   Candida glabrata NOT DETECTED NOT DETECTED Final   Candida krusei NOT DETECTED NOT DETECTED Final   Candida parapsilosis  NOT DETECTED NOT DETECTED Final   Candida  tropicalis NOT DETECTED NOT DETECTED Final    Comment: Performed at [facility], [address] [place], [place] [address]  CULTURE, BLOOD (ROUTINE X 2) w Reflex to ID Panel     Status: None (Preliminary result)   Collection Time: 02/13/19  3:09 PM   Specimen: BLOOD  Result Value Ref Range Status   Specimen Description BLOOD BLOOD LEFT HAND  Final   Special Requests   Final    BOTTLES DRAWN AEROBIC AND ANAEROBIC Blood Culture adequate volume   Culture   Final    NO GROWTH 3 DAYS Performed at [facility], [address] [place], [place] [address]    Report Status PENDING  Incomplete  Culture, blood (routine x 2)     Status: None (Preliminary result)   Collection Time: 02/15/19 12:49 PM   Specimen: BLOOD  Result Value Ref Range Status   Specimen Description BLOOD LT HAND  Final   Special Requests   Final    BOTTLES DRAWN AEROBIC AND ANAEROBIC Blood Culture results may not be optimal due to an inadequate volume of blood received in culture bottles   Culture  Setup Time   Final    GRAM POSITIVE COCCI IN BOTH AEROBIC AND ANAEROBIC BOTTLES CRITICAL RESULT CALLED TO, READ BACK BY AND VERIFIED WITHEleonore Chiquito AT [id number] 02/16/2019 [place] Performed at [facility], [address] [place], [place] [address]    Culture GRAM POSITIVE COCCI  Final   Report Status PENDING  Incomplete    Coagulation Studies: No results for input(s): LABPROT, INR in the last 72 hours.  Urinalysis: No results for input(s): COLORURINE, LABSPEC, PHURINE, GLUCOSEU, HGBUR, BILIRUBINUR, KETONESUR, PROTEINUR, UROBILINOGEN, NITRITE, LEUKOCYTESUR in the last 72 hours.  Invalid input(s): APPERANCEUR    Imaging: No results found.   Medications:   . sodium chloride 250 mL (02/10/19 0054)  . ceFTAROline (TEFLARO) IV 300 mg (02/16/19 1308)  . [START ON 02/18/2019] DAPTOmycin (CUBICIN)  IV     . aspirin  81 mg Per Tube Daily  . atorvastatin  40 mg Per Tube q1800  . chlorhexidine   15 mL Mouth Rinse BID  . Chlorhexidine Gluconate Cloth  6 each Topical Q0600  . Chlorhexidine Gluconate Cloth  6 each Topical Q0600  . epoetin (EPOGEN/PROCRIT) injection  4,000 Units Intravenous Q M,W,F-HD  . FLUoxetine  20 mg Per Tube Daily  . fluticasone  2 spray Each Nare Daily  . insulin aspart  0-9 Units Subcutaneous TID WC  . levothyroxine  88 mcg Per Tube QAC breakfast  . mouth rinse  15 mL Mouth Rinse q12n4p  . midodrine  10 mg Per Tube TID WC  . multivitamin  15 mL Per Tube Daily  . mupirocin ointment  1 application Nasal BID  . pantoprazole (PROTONIX) IV  40 mg Intravenous Q24H  . phosphorus  500 mg Per Tube Once  . sodium chloride flush  3 mL Intravenous Once  . venlafaxine  75 mg Per Tube BID WC  . zinc sulfate  220 mg Per Tube Daily   sodium chloride, ondansetron (ZOFRAN) IV  Assessment/ Plan:  63 y.o.caucasian female with end-stage renal disease, atrial fibrillation, chronic diastolic congestive heart failure, CAD status post PCI, CABG,  type 2 diabetes, hypertension, hyperlipidemia, hypothyroidism, Covid positive in July 2020, recent nursing home stay in [place]  NEPHROLOGIST: Lavonia Dana MD  LOCATION: [facility]  SCHEDULE: M-W-F 2nd Shift  EDW:  81.5 kg.  KIDNEY: [place] Optiflux 180NR  LITERS PROC: liters/treatment  HD TIME: 225  ACCESS: R Upper Arm AVF  NEEDLE SIZE:  ANTICOAG: Custom Heparin 2000  BATH: 2K/2.5Ca  QB: 400 ml/min  QD: 600 ml/min   1.  End-stage renal disease -Patient due for hemodialysis today.  Orders have been prepared.  2.  Anemia of chronic kidney disease anemia of chronic kidney disease Lab Results  Component Value Date   HGB 8.7 (L) 02/16/2019  -Maintain the patient on Epogen 4070 with dialysis.  3. SHPTH -continue to periodically monitor bone metabolism parameters.  4. Altered mental status -Appears improved as compared to earlier in the admission.  Continue to monitor neurologic status.  5. Sepsis - blood culture  positive for MRSA from 02/09/2019  ? Source; ID team following Patient started on ceftaroline and on Cubicin.  6. Hypokalemia Patient to be dialyzed against a 4K bath.  7. Left arm DVT Heparin infusion    LOS: 7 Kyndall Chaplin 12/14/20203:17 PM  [place], [place]  Note: This note was prepared with Dragon dictation. Any transcription errors are unintentional

## 2019-02-16 NOTE — Progress Notes (Signed)
PROGRESS NOTE    Jamie Arias  [id number] DOB: May 21, 1955 DOA: 02/09/2019 PCP: [name], MD    Brief Narrative:  63 year old female with end-stage renal disease on hemodialysis, admitted to the hospital with severe septic shock secondary to MRSA bacteremia. She had associated metabolic encephalopathy. She is being treated with intravenous vancomycin. She is not requiring any further pressors. Encephalopathy has been slow to improve.   Assessment & Plan:   Principal Problem:   Sepsis due to methicillin resistant Staphylococcus aureus (MRSA) ([place]) Active Problems:   Diabetes mellitus, type 2 (HCC)   Hypotension   Chronic diastolic CHF (congestive heart failure) (HCC)   ESRD (end stage renal disease) (HCC)   Altered level of consciousness   Pressure injury of skin   MRSA bacteremia   Arm DVT (deep venous thromboembolism), acute, left (HCC)   Acute encephalopathy   1. Septic shock secondary to MRSA bacteremia. Currently on vancomycin. Infectious disease following. 2D echocardiogram does not show any evidence of vegetations. TEE has been requested, discussed with cardiology. Overall hemodynamics are improving. She is not requiring any vasopressor support. Etiology of bacteremia is not entirely clear.  Patient has had persistent bacteremia despite IV vancomycin for the past 7 days.  ID following and vancomycin has been changed to daptomycin and Teflaro.  Ultrasound of right upper extremity ordered to evaluate AV graft for underlying abscess. 2. End-stage renal disease on hemodialysis. Nephrology following for dialysis needs.  3. Acute encephalopathy. Suspect this is related to sepsis. Overall her mental status appears to be improving. MRI of the brain did not show any acute infarct. EEG did not show any evidence of seizures. She was noted to have twitching in her lower jaw, but her husband reports this is been present for the past several months. 4. Acute left arm DVT. Noted  on venous Dopplers. Started on heparin infusion, but this was held due to oozing around central line.  5. History of atrial fibrillation. She was taking amiodarone and Eliquis prior to admission. Currently in sinus rhythm. Continue to follow heart rate. She will be anticoagulated for her venous thromboembolism. 6. Chronic diastolic congestive heart failure. Appears to have significant edema. Can hopefully remove volume with dialysis. 7. Diabetes. Continue on sliding scale insulin. Blood sugars are currently stable. 8. Acute on chronic anemia. Possibly related to blood loss from oozing around IV sites. Improved after 1 unit prbc on 12/13   DVT prophylaxis: Heparin  Code Status: Full code Family Communication: none present Disposition Plan: May need placement pending hospital course   Consultants:   ID  Nephrology  Procedures:   Right femoral CVC 12/8>  Right femoral art line 12/8>  Antimicrobials:   Vancomycin 12/7>12/14  Daptomycin 12/14>  ceftaroline 12/14>   Subjective: Patient is somnolent. She opens her eyes to voice and answers questions. She denies any pain or shortness of breath  Objective: Vitals:   02/16/19 1800 02/16/19 1805 02/16/19 1815 02/16/19 1826  BP: (!) 131/45  (!) 135/47   Pulse: 72 72 68 67  Resp: 14 13 12 14   Temp:  98.9 F (37.2 C)    TempSrc:  Axillary    SpO2:      Weight:      Height:        Intake/Output Summary (Last 24 hours) at 02/16/2019 1842 Last data filed at 02/16/2019 1805 Gross per 24 hour  Intake 424.33 ml  Output 0 ml  Net 424.33 ml   Filed Weights   02/10/19  1700 02/12/19 1130 02/12/19 1445  Weight: 78.1 kg 78 kg 78.2 kg    Examination:  General exam: lethargic, no distress Respiratory system: Clear to auscultation. Respiratory effort normal. Cardiovascular system:RRR. No murmurs, rubs, gallops. Gastrointestinal system: Abdomen is nondistended, soft and nontender. No organomegaly or masses felt. Normal bowel  sounds heard. Central nervous system: Alert and oriented. No focal neurological deficits. Extremities: edema in upper extremities bilaterally Skin: No rashes, lesions or ulcers Psychiatry: somnolent but wakes up to voice     Data Reviewed: I have personally reviewed following labs and imaging studies  CBC: Recent Labs  Lab 02/12/19 0444 02/13/19 0453 02/14/19 0340 02/15/19 0545 02/15/19 1824 02/16/19 0433  WBC 8.2 6.9 8.0 8.9  --  8.9  HGB 7.8* 7.9* 7.2* 6.8* 8.8* 8.7*  HCT 24.4* 24.7* 22.2* 20.0* 25.0* 24.7*  MCV 84.4 86.1 83.8 80.3  --  79.7*  PLT 131* 107* 106* 105*  --  123XX123*   Basic Metabolic Panel: Recent Labs  Lab 02/12/19 0444 02/13/19 0453 02/14/19 0340 02/15/19 0545 02/16/19 0433  NA 145 143 142 138 139  K 2.6* 3.2* 3.1* 3.6 3.4*  CL 103 103 104 102 102  CO2 30 27 26 24 24   GLUCOSE 118* 108* 109* 96 166*  BUN 16 17 23  26* 30*  CREATININE 3.74* 3.31* 4.03* 4.64* 5.30*  CALCIUM 8.2* 8.4* 8.0* 7.9* 8.2*  MG 1.7 2.3 2.2 2.2 1.9  PHOS 2.0* 1.7* 2.2* 2.2* 2.5   GFR: Estimated Creatinine Clearance: 10.7 mL/min (A) (by C-G formula based on SCr of 5.3 mg/dL (H)). Liver Function Tests: Recent Labs  Lab 02/10/19 0500 02/12/19 0444 02/13/19 0453 02/14/19 0340 02/16/19 0433  AST 70*  --   --   --   --   ALT 35  --   --   --   --   ALKPHOS 92  --   --   --   --   BILITOT 1.2  --   --   --   --   PROT 5.2*  --   --   --   --   ALBUMIN 1.7* 1.9* 2.2* 2.2* 2.1*   No results for input(s): LIPASE, AMYLASE in the last 168 hours. No results for input(s): AMMONIA in the last 168 hours. Coagulation Profile: Recent Labs  Lab 02/10/19 0500  INR 1.1   Cardiac Enzymes: Recent Labs  Lab 02/16/19 0433  CKTOTAL 17*   BNP (last 3 results) No results for input(s): PROBNP in the last 8760 hours. HbA1C: No results for input(s): HGBA1C in the last 72 hours. CBG: Recent Labs  Lab 02/15/19 1130 02/15/19 1621 02/15/19 2221 02/16/19 0810 02/16/19 1206  GLUCAP  97 210* 150* 153* 163*   Lipid Profile: No results for input(s): CHOL, HDL, LDLCALC, TRIG, CHOLHDL, LDLDIRECT in the last 72 hours. Thyroid Function Tests: No results for input(s): TSH, T4TOTAL, FREET4, T3FREE, THYROIDAB in the last 72 hours. Anemia Panel: No results for input(s): VITAMINB12, FOLATE, FERRITIN, TIBC, IRON, RETICCTPCT in the last 72 hours. Sepsis Labs: Recent Labs  Lab 02/10/19 2018  LATICACIDVEN 1.0    Recent Results (from the past 240 hour(s))  Culture, blood (routine x 2)     Status: Abnormal   Collection Time: 02/09/19  2:29 PM   Specimen: BLOOD LEFT ARM  Result Value Ref Range Status   Specimen Description   Final    BLOOD LEFT ARM Performed at [facility], [address], [place], [place] [address]  Special Requests   Final    BOTTLES DRAWN AEROBIC AND ANAEROBIC Blood Culture adequate volume Performed at [facility], [address] [place], [place] [address]    Culture  Setup Time   Final    GRAM POSITIVE COCCI IN BOTH AEROBIC AND ANAEROBIC BOTTLES CRITICAL VALUE NOTED.  VALUE IS CONSISTENT WITH PREVIOUSLY REPORTED AND CALLED VALUE. Performed at [facility], [address] [place], [place] [address]    Culture (A)  Final    STAPHYLOCOCCUS AUREUS SUSCEPTIBILITIES PERFORMED ON PREVIOUS CULTURE WITHIN THE LAST 5 DAYS. Performed at [facility], [place] [address] [place], [place] [address]    Report Status 02/12/2019 FINAL  Final  Culture, blood (routine x 2)     Status: Abnormal   Collection Time: 02/09/19  2:38 PM   Specimen: BLOOD LEFT ARM  Result Value Ref Range Status   Specimen Description   Final    BLOOD LEFT ARM Performed at [facility], [address], [place], [place] [address]    Special Requests   Final    BOTTLES DRAWN AEROBIC AND ANAEROBIC Blood Culture adequate volume Performed at [facility], [address] [place], [place] [address]    Culture  Setup Time   Final     GRAM POSITIVE COCCI IN BOTH AEROBIC AND ANAEROBIC BOTTLES CRITICAL RESULT CALLED TO, READ BACK BY AND VERIFIED WITH: Osgood [id number] 02/10/2019 HNM Performed at [facility], [place] [address] [place], [place] [address]    Culture METHICILLIN RESISTANT STAPHYLOCOCCUS AUREUS (A)  Final   Report Status 02/12/2019 FINAL  Final   Organism ID, Bacteria METHICILLIN RESISTANT STAPHYLOCOCCUS AUREUS  Final      Susceptibility   Methicillin resistant staphylococcus aureus - MIC*    CIPROFLOXACIN >=8 RESISTANT Resistant     ERYTHROMYCIN >=8 RESISTANT Resistant     GENTAMICIN <=0.5 SENSITIVE Sensitive     OXACILLIN >=4 RESISTANT Resistant     TETRACYCLINE <=1 SENSITIVE Sensitive     VANCOMYCIN 1 SENSITIVE Sensitive     TRIMETH/SULFA <=10 SENSITIVE Sensitive     CLINDAMYCIN RESISTANT Resistant     RIFAMPIN <=0.5 SENSITIVE Sensitive     Inducible Clindamycin POSITIVE Resistant     * METHICILLIN RESISTANT STAPHYLOCOCCUS AUREUS  Blood Culture ID Panel (Reflexed)     Status: Abnormal   Collection Time: 02/09/19  2:38 PM  Result Value Ref Range Status   Enterococcus species NOT DETECTED NOT DETECTED Final   Listeria monocytogenes NOT DETECTED NOT DETECTED Final   Staphylococcus species DETECTED (A) NOT DETECTED Final    Comment: CRITICAL RESULT CALLED TO, READ BACK BY AND VERIFIED WITH: Hart Robinsons PHARMD [id number] 02/10/2019 HNM    Staphylococcus aureus (BCID) DETECTED (A) NOT DETECTED Final    Comment: Methicillin (oxacillin)-resistant Staphylococcus aureus (MRSA). MRSA is predictably resistant to beta-lactam antibiotics (except ceftaroline). Preferred therapy is vancomycin unless clinically contraindicated. Patient requires contact precautions if  hospitalized. CRITICAL RESULT CALLED TO, READ BACK BY AND VERIFIED WITH: Hart Robinsons PHARMD [id number] 02/10/2019 HNM    Methicillin resistance DETECTED (A) NOT DETECTED Final    Comment: CRITICAL RESULT CALLED TO, READ BACK BY AND VERIFIED WITH: Hart Robinsons PHARMD [id number] 02/10/2019 HNM    Streptococcus species NOT DETECTED NOT DETECTED Final   Streptococcus agalactiae NOT DETECTED NOT DETECTED Final   Streptococcus pneumoniae NOT DETECTED NOT DETECTED Final   Streptococcus pyogenes NOT DETECTED NOT DETECTED Final   Acinetobacter baumannii NOT DETECTED NOT DETECTED  Final   Enterobacteriaceae species NOT DETECTED NOT DETECTED Final   Enterobacter cloacae complex NOT DETECTED NOT DETECTED Final   Escherichia coli NOT DETECTED NOT DETECTED Final   Klebsiella oxytoca NOT DETECTED NOT DETECTED Final   Klebsiella pneumoniae NOT DETECTED NOT DETECTED Final   Proteus species NOT DETECTED NOT DETECTED Final   Serratia marcescens NOT DETECTED NOT DETECTED Final   Haemophilus influenzae NOT DETECTED NOT DETECTED Final   Neisseria meningitidis NOT DETECTED NOT DETECTED Final   Pseudomonas aeruginosa NOT DETECTED NOT DETECTED Final   Candida albicans NOT DETECTED NOT DETECTED Final   Candida glabrata NOT DETECTED NOT DETECTED Final   Candida krusei NOT DETECTED NOT DETECTED Final   Candida parapsilosis NOT DETECTED NOT DETECTED Final   Candida tropicalis NOT DETECTED NOT DETECTED Final    Comment: Performed at [facility], [place], [place] [address]  SARS CORONAVIRUS 2 (TAT 6-24 HRS) Nasopharyngeal Nasopharyngeal Swab     Status: None   Collection Time: 02/09/19  2:54 PM   Specimen: Nasopharyngeal Swab  Result Value Ref Range Status   SARS Coronavirus 2 NEGATIVE NEGATIVE Final    Comment: (NOTE) SARS-CoV-2 target nucleic acids are NOT DETECTED. The SARS-CoV-2 RNA is generally detectable in upper and lower respiratory specimens during the acute phase of infection. Negative results do not preclude SARS-CoV-2 infection, do not rule out co-infections with other pathogens, and should not be used as the sole basis for treatment or other patient management decisions. Negative results must be combined with clinical  observations, patient history, and epidemiological information. The expected result is Negative. Fact Sheet for Patients: SugarRoll.be Fact Sheet for Healthcare Providers: https://www.woods-mathews.com/ This test is not yet approved or cleared by the [place] FDA and  has been authorized for detection and/or diagnosis of SARS-CoV-2 by FDA under an Emergency Use Authorization (EUA). This EUA will remain  in effect (meaning this test can be used) for the duration of the COVID-19 declaration under Section 56 4(b)(1) of the Act, 21 U.S.C. section 360bbb-3(b)(1), unless the authorization is terminated or revoked sooner. Performed at [facility], [place] [address] [place], [place] [address]   MRSA PCR Screening     Status: Abnormal   Collection Time: 02/10/19  5:03 PM   Specimen: Nasopharyngeal  Result Value Ref Range Status   MRSA by PCR POSITIVE (A) NEGATIVE Final    Comment:        The GeneXpert MRSA Assay (FDA approved for NASAL specimens only), is one component of a comprehensive MRSA colonization surveillance program. It is not intended to diagnose MRSA infection nor to guide or monitor treatment for MRSA infections. RESULT CALLED TO, READ BACK BY AND VERIFIED WITH: ALEKSEY FRASER @1839  02/10/19 MJU Performed at [facility], [address] [place], [place] [address]   Culture, blood (routine x 2)     Status: Abnormal   Collection Time: 02/11/19 12:31 PM   Specimen: BLOOD  Result Value Ref Range Status   Specimen Description   Final    BLOOD PORTA CATH Performed at [facility] [address] [place], [place] [address]    Special Requests   Final    BOTTLES DRAWN AEROBIC AND ANAEROBIC Blood Culture adequate volume Performed at [place] [place], [place] [address]    Culture  Setup Time   Final    GRAM POSITIVE COCCI ANAEROBIC BOTTLE ONLY CRITICAL RESULT CALLED TO, READ  BACK BY AND VERIFIED  WITH: Hart Robinsons AT [id number] 02/12/2019 [facility]  Performed at [facility], [address] [place], [place] [address]    Culture (A)  Final    STAPHYLOCOCCUS AUREUS SUSCEPTIBILITIES PERFORMED ON PREVIOUS CULTURE WITHIN THE LAST 5 DAYS. Performed at [facility], [place] [address], [place], [place] [address]    Report Status 02/13/2019 FINAL  Final  Culture, blood (routine x 2)     Status: None   Collection Time: 02/11/19  3:57 PM   Specimen: BLOOD  Result Value Ref Range Status   Specimen Description BLOOD PORTA CATH  Final   Special Requests   Final    BOTTLES DRAWN AEROBIC AND ANAEROBIC Blood Culture adequate volume   Culture   Final    NO GROWTH 5 DAYS Performed at [facility], [address] [place], [place] [address]    Report Status 02/16/2019 FINAL  Final  CULTURE, BLOOD (ROUTINE X 2) w Reflex to ID Panel     Status: Abnormal   Collection Time: 02/13/19  2:18 PM   Specimen: BLOOD LEFT HAND  Result Value Ref Range Status   Specimen Description BLOOD LEFT HAND  Final   Special Requests   Final    BOTTLES DRAWN AEROBIC AND ANAEROBIC Blood Culture results may not be optimal due to an inadequate volume of blood received in culture bottles   Culture  Setup Time   Final    AEROBIC BOTTLE ONLY GRAM POSITIVE COCCI IN CLUSTERS CRITICAL RESULT CALLED TO, READ BACK BY AND VERIFIED WITH: Herbert Pun AT [id number] ON 02/14/2019 [place]. Performed at [facility], [place] [address] [place], [place] [address]    Culture METHICILLIN RESISTANT STAPHYLOCOCCUS AUREUS (A)  Final   Report Status 02/16/2019 FINAL  Final   Organism ID, Bacteria METHICILLIN RESISTANT STAPHYLOCOCCUS AUREUS  Final      Susceptibility   Methicillin resistant staphylococcus aureus - MIC*    CIPROFLOXACIN >=8 RESISTANT Resistant     ERYTHROMYCIN >=8 RESISTANT Resistant     GENTAMICIN <=0.5 SENSITIVE Sensitive     OXACILLIN >=4 RESISTANT Resistant     TETRACYCLINE <=1  SENSITIVE Sensitive     VANCOMYCIN <=0.5 SENSITIVE Sensitive     TRIMETH/SULFA <=10 SENSITIVE Sensitive     CLINDAMYCIN RESISTANT Resistant     RIFAMPIN <=0.5 SENSITIVE Sensitive     Inducible Clindamycin POSITIVE Resistant     * METHICILLIN RESISTANT STAPHYLOCOCCUS AUREUS  Blood Culture ID Panel (Reflexed)     Status: Abnormal   Collection Time: 02/13/19  2:18 PM  Result Value Ref Range Status   Enterococcus species NOT DETECTED NOT DETECTED Final   Listeria monocytogenes NOT DETECTED NOT DETECTED Final   Staphylococcus species DETECTED (A) NOT DETECTED Final    Comment: CRITICAL RESULT CALLED TO, READ BACK BY AND VERIFIED WITH: ABBEY ELLINGTON AT [id number] ON 02/14/2019 [place].    Staphylococcus aureus (BCID) DETECTED (A) NOT DETECTED Final    Comment: Methicillin (oxacillin)-resistant Staphylococcus aureus (MRSA). MRSA is predictably resistant to beta-lactam antibiotics (except ceftaroline). Preferred therapy is vancomycin unless clinically contraindicated. Patient requires contact precautions if  hospitalized. CRITICAL RESULT CALLED TO, READ BACK BY AND VERIFIED WITH: Herbert Pun AT [id number] ON 02/14/2019 [place].    Methicillin resistance DETECTED (A) NOT DETECTED Final    Comment: CRITICAL RESULT CALLED TO, READ BACK BY AND VERIFIED WITH: Herbert Pun AT [id number] ON 02/14/2019 [place].    Streptococcus species NOT DETECTED NOT DETECTED Final   Streptococcus agalactiae NOT DETECTED NOT DETECTED  Final   Streptococcus pneumoniae NOT DETECTED NOT DETECTED Final   Streptococcus pyogenes NOT DETECTED NOT DETECTED Final   Acinetobacter baumannii NOT DETECTED NOT DETECTED Final   Enterobacteriaceae species NOT DETECTED NOT DETECTED Final   Enterobacter cloacae complex NOT DETECTED NOT DETECTED Final   Escherichia coli NOT DETECTED NOT DETECTED Final   Klebsiella oxytoca NOT DETECTED NOT DETECTED Final   Klebsiella pneumoniae NOT DETECTED NOT DETECTED Final   Proteus species NOT DETECTED NOT DETECTED  Final   Serratia marcescens NOT DETECTED NOT DETECTED Final   Haemophilus influenzae NOT DETECTED NOT DETECTED Final   Neisseria meningitidis NOT DETECTED NOT DETECTED Final   Pseudomonas aeruginosa NOT DETECTED NOT DETECTED Final   Candida albicans NOT DETECTED NOT DETECTED Final   Candida glabrata NOT DETECTED NOT DETECTED Final   Candida krusei NOT DETECTED NOT DETECTED Final   Candida parapsilosis NOT DETECTED NOT DETECTED Final   Candida tropicalis NOT DETECTED NOT DETECTED Final    Comment: Performed at [facility], [address] [place], [place] [address]  CULTURE, BLOOD (ROUTINE X 2) w Reflex to ID Panel     Status: None (Preliminary result)   Collection Time: 02/13/19  3:09 PM   Specimen: BLOOD  Result Value Ref Range Status   Specimen Description BLOOD BLOOD LEFT HAND  Final   Special Requests   Final    BOTTLES DRAWN AEROBIC AND ANAEROBIC Blood Culture adequate volume   Culture   Final    NO GROWTH 3 DAYS Performed at [facility], [address], [place], [place] [address]    Report Status PENDING  Incomplete  Culture, blood (routine x 2)     Status: None (Preliminary result)   Collection Time: 02/15/19 12:49 PM   Specimen: BLOOD  Result Value Ref Range Status   Specimen Description BLOOD LT HAND  Final   Special Requests   Final    BOTTLES DRAWN AEROBIC AND ANAEROBIC Blood Culture results may not be optimal due to an inadequate volume of blood received in culture bottles   Culture  Setup Time   Final    GRAM POSITIVE COCCI IN BOTH AEROBIC AND ANAEROBIC BOTTLES CRITICAL RESULT CALLED TO, READ BACK BY AND VERIFIED WITHEleonore Chiquito AT [id number] 02/16/2019 SDR Performed at [facility], [address] [place], [place] [address]    Culture GRAM POSITIVE COCCI  Final   Report Status PENDING  Incomplete         Radiology Studies: No results found.      Scheduled Meds: . aspirin  81 mg Per Tube Daily  . atorvastatin  40 mg Per  Tube q1800  . chlorhexidine  15 mL Mouth Rinse BID  . Chlorhexidine Gluconate Cloth  6 each Topical Q0600  . Chlorhexidine Gluconate Cloth  6 each Topical Q0600  . epoetin (EPOGEN/PROCRIT) injection  4,000 Units Intravenous Q M,W,F-HD  . FLUoxetine  20 mg Per Tube Daily  . fluticasone  2 spray Each Nare Daily  . insulin aspart  0-9 Units Subcutaneous TID WC  . levothyroxine  88 mcg Per Tube QAC breakfast  . mouth rinse  15 mL Mouth Rinse q12n4p  . midodrine  10 mg Per Tube TID WC  . multivitamin  15 mL Per Tube Daily  . mupirocin ointment  1 application Nasal BID  . pantoprazole (PROTONIX) IV  40 mg Intravenous Q24H  . phosphorus  500 mg Per Tube Once  . sodium chloride flush  3 mL Intravenous  Once  . venlafaxine  75 mg Per Tube BID WC  . zinc sulfate  220 mg Per Tube Daily   Continuous Infusions: . sodium chloride 250 mL (02/10/19 0054)  . ceFTAROline (TEFLARO) IV Stopped (02/16/19 1408)  . [START ON 02/18/2019] DAPTOmycin (CUBICIN)  IV       LOS: 7 days    Time spent: 53mins    Kathie Dike, MD [facility]   If 7PM-7AM, please contact night-coverage [email or web address]  02/16/2019, 6:42 PM

## 2019-02-16 NOTE — Progress Notes (Signed)
This note also relates to the following rows which could not be included: Pulse Rate - Cannot attach notes to unvalidated device data Resp - Cannot attach notes to unvalidated device data BP - Cannot attach notes to unvalidated device data  Hd started  

## 2019-02-16 NOTE — Progress Notes (Signed)
Date of Admission:  02/09/2019     ID: Jamie Arias is a 63 y.o. female  Principal Problem:   Sepsis due to methicillin resistant Staphylococcus aureus (MRSA) ([place]) Active Problems:   Diabetes mellitus, type 2 (HCC)   Hypotension   Chronic diastolic CHF (congestive heart failure) (HCC)   ESRD (end stage renal disease) ([place])   Altered level of consciousness   Pressure injury of skin   MRSA bacteremia   Arm DVT (deep venous thromboembolism), acute, left (HCC)   Acute encephalopathy    Subjective: None    Medications:  . aspirin  81 mg Per Tube Daily  . atorvastatin  40 mg Per Tube q1800  . chlorhexidine  15 mL Mouth Rinse BID  . Chlorhexidine Gluconate Cloth  6 each Topical Q0600  . Chlorhexidine Gluconate Cloth  6 each Topical Q0600  . epoetin (EPOGEN/PROCRIT) injection  4,000 Units Intravenous Q M,W,F-HD  . FLUoxetine  20 mg Per Tube Daily  . fluticasone  2 spray Each Nare Daily  . insulin aspart  0-9 Units Subcutaneous TID WC  . levothyroxine  88 mcg Per Tube QAC breakfast  . mouth rinse  15 mL Mouth Rinse q12n4p  . midodrine  10 mg Per Tube TID WC  . multivitamin  15 mL Per Tube Daily  . mupirocin ointment  1 application Nasal BID  . pantoprazole (PROTONIX) IV  40 mg Intravenous Q24H  . phosphorus  500 mg Per Tube Once  . sodium chloride flush  3 mL Intravenous Once  . venlafaxine  75 mg Per Tube BID WC  . zinc sulfate  220 mg Per Tube Daily    Objective: Vital signs in last 24 hours: Temp:  [97.9 F (36.6 C)-98.4 F (36.9 C)] 98.1 F (36.7 C) (12/14 0800) Pulse Rate:  [59-73] 66 (12/14 0900) Resp:  [11-33] 16 (12/14 0900) BP: (114-153)/(44-64) 150/55 (12/14 0900) SpO2:  [95 %-100 %] 98 % (12/14 0900)  PHYSICAL EXAM:  General: lethargic, opens her eyes to calling her name  Head: Normocephalic, without obvious abnormality, atraumatic. Eyes: Conjunctivae clear, anicteric sclerae. Pupils are equal ENT cannot be examined Neck: Supple,  Lungs: b/l air  entry  Heart: irregular Abdomen: Soft, non-tender,not distended. Bowel sounds normal. No masses Extremities: left heel eschar Skin: No rashes or lesions. Or bruising Lymph: Cervical, supraclavicular normal. Neurologic: unable to assess in detail femoral line ( placed 12/8)   Lab Results Recent Labs    02/15/19 0545 02/15/19 1824 02/16/19 0433  WBC 8.9  --  8.9  HGB 6.8* 8.8* 8.7*  HCT 20.0* 25.0* 24.7*  NA 138  --  139  K 3.6  --  3.4*  CL 102  --  102  CO2 24  --  24  BUN 26*  --  30*  CREATININE 4.64*  --  5.30*   Liver Panel Recent Labs    02/14/19 0340 02/16/19 0433  ALBUMIN 2.2* 2.1*   Sedimentation Rate No results for input(s): ESRSEDRATE in the last 72 hours. C-Reactive Protein No results for input(s): CRP in the last 72 hours.  Microbiology: 12/7, 12/9,12/11, 12/13 Positive Studies/Results: No results found.   Assessment/Plan:  MRSA bacteremia- Persistent Source not identified yet- AVfistula VS deep seated source like osteo,VS endocarditis Left heel unstageable wound with eschar- less likely the source TEE needed Ultrasound rt arm to look for any fluid collection at the fistula site CT of the cervical, thoracic and lumbar spine with contrast to look for discitis/abscess Change  vanco to dapto+ceftaroline.  Pt has no hardware , no prosthetic heart valve, no cardiac device. Has a  stent in the AV fistula  Metabolic encephalopathy (  Post covid moribund state  Diarrhea= intermittently  present sinne admission( tried to get a cdiff test on day 3 but no stool then)  has gotten worse in 48-72 hrs  No wbc, no abdominal pain or distension, or tenderness, no fever-does not behave like Cdiff -  will check stool WBC to see whether this is inflammatory diarrhea

## 2019-02-16 NOTE — Progress Notes (Signed)
The patient was scheduled to have a TEE done today.  However, when I came to see her, she was lethargic and did not respond to my questions other than opening her eyes. Her mental status has to be more clear before doing TEE.  Please let us know when she is alert and oriented.

## 2019-02-16 NOTE — Consult Note (Signed)
Pharmacy Antibiotic Note  Jamie Arias is a 63 y.o. female admitted on 02/09/2019 with MRSA  bacteremia. Patient has history of ESRD on HD MWF. Missed dialysis yesterday. Blood cultures with 4/4 GPC and BCID detected MRSA. Source dialysis catheter? Pharmacy has been consulted for vancomycin dosing. Out-patient HD schedule is MWF, however schedule has been off while in-patient. ID following.   Per nephrology note, next dialysis session is planned for 12/14. WBC stable and patient is afebrile. Tentative plan for TEE today. Vancomycin random level 12/14: 25. Per ID will discontinue vancomycin and start ceftaroline + daptomycin due to persistent bacteremia. Baseline CK 17.  [place] of Patients With Methicillin-Resistant Staphylococcus aureus Bacteremia Receiving Daptomycin Plus Ceftaroline Compared With Other MRSA Treatments. Open Forum Infect Dis. 2019 Dec 31;7(1):ofz538.  Plan: Ceftaroline 300 mg q12h   Daptomycin (~8 mg/kg) 650 mg q48h  CK weekly while on daptomycin. Antibiotics are dosed for HD.  Height: 5\' 4"  (162.6 cm) Weight: 172 lb 6.4 oz (78.2 kg) IBW/kg (Calculated) : 54.7  Temp (24hrs), Avg:98.2 F (36.8 C), Min:97.9 F (36.6 C), Max:98.4 F (36.9 C)  Recent Labs  Lab 02/09/19 1438 02/09/19 1836 02/10/19 2018 02/12/19 0444 02/13/19 0453 02/14/19 0340 02/15/19 0545 02/16/19 0433  WBC  --   --   --  8.2 6.9 8.0 8.9 8.9  CREATININE  --   --   --  3.74* 3.31* 4.03* 4.64* 5.30*  LATICACIDVEN 2.6* 1.4 1.0  --   --   --   --   --     Estimated Creatinine Clearance: 10.7 mL/min (A) (by C-G formula based on SCr of 5.3 mg/dL (H)).    Allergies  Allergen Reactions  . Nsaids Other (See Comments)    Contraindicated due to kidney disease.  Marland Kitchen Doxycycline Other (See Comments)    tremor    Antimicrobials this admission: Cefepime and metronidazole 12/7 x1  Pip/tazo 12/8 x1 Vancomycin 12/7 >> 12/14 Daptomycin 12/14 >> Ceftaroline 12/14  >>  Dose adjustments this admission: 12/8: Increased maintenance dose from 500 mg to 1000 mg qHD based on up-dated weight in chart  Microbiology results: 12/13 BCx: 1/2 bottles (one set) 12/11 BCx: 1/4 bottles S aureus 12/9 BCx: 1/4 bottles S aureus 12/7 BCx: 4/4 bottles GPC (Vancomycin MIC 1) 12/7 BCID: MRSA 12/7 SARS-CoV-2 NAAT: negative  Thank you for allowing pharmacy to be a part of this patient's care.  [place] Resident 02/16/2019 8:09 AM

## 2019-02-16 NOTE — Progress Notes (Signed)
Hd completed 

## 2019-02-17 ENCOUNTER — Inpatient Hospital Stay: Payer: Medicare Other

## 2019-02-17 ENCOUNTER — Ambulatory Visit: Payer: Medicare Other | Admitting: Physician Assistant

## 2019-02-17 ENCOUNTER — Encounter: Payer: Self-pay | Admitting: Family Medicine

## 2019-02-17 DIAGNOSIS — I82622 Acute embolism and thrombosis of deep veins of left upper extremity: Secondary | ICD-10-CM

## 2019-02-17 DIAGNOSIS — L97429 Non-pressure chronic ulcer of left heel and midfoot with unspecified severity: Secondary | ICD-10-CM

## 2019-02-17 LAB — BLOOD GAS, ARTERIAL
Acid-Base Excess: 3.8 mmol/L — ABNORMAL HIGH (ref 0.0–2.0)
Bicarbonate: 25.7 mmol/L (ref 20.0–28.0)
FIO2: 0.21
O2 Saturation: 98 %
Patient temperature: 37
pCO2 arterial: 30 mmHg — ABNORMAL LOW (ref 32.0–48.0)
pH, Arterial: 7.54 — ABNORMAL HIGH (ref 7.350–7.450)
pO2, Arterial: 92 mmHg (ref 83.0–108.0)

## 2019-02-17 LAB — RENAL FUNCTION PANEL
Albumin: 2 g/dL — ABNORMAL LOW (ref 3.5–5.0)
Anion gap: 12 (ref 5–15)
BUN: 14 mg/dL (ref 8–23)
CO2: 25 mmol/L (ref 22–32)
Calcium: 8.1 mg/dL — ABNORMAL LOW (ref 8.9–10.3)
Chloride: 102 mmol/L (ref 98–111)
Creatinine, Ser: 3.27 mg/dL — ABNORMAL HIGH (ref 0.44–1.00)
GFR calc Af Amer: 16 mL/min — ABNORMAL LOW (ref >60)
GFR calc non Af Amer: 14 mL/min — ABNORMAL LOW (ref >60)
Glucose, Bld: 101 mg/dL — ABNORMAL HIGH (ref 70–99)
Phosphorus: 1.8 mg/dL — ABNORMAL LOW (ref 2.5–4.6)
Potassium: 4 mmol/L (ref 3.5–5.1)
Sodium: 139 mmol/L (ref 135–145)

## 2019-02-17 LAB — PROTIME-INR
INR: 1.3 — ABNORMAL HIGH (ref 0.8–1.2)
Prothrombin Time: 15.6 seconds — ABNORMAL HIGH (ref 11.4–15.2)

## 2019-02-17 LAB — GLUCOSE, CAPILLARY
Glucose-Capillary: 105 mg/dL — ABNORMAL HIGH (ref 70–99)
Glucose-Capillary: 119 mg/dL — ABNORMAL HIGH (ref 70–99)
Glucose-Capillary: 153 mg/dL — ABNORMAL HIGH (ref 70–99)

## 2019-02-17 LAB — APTT: aPTT: >160 seconds (ref 24–36)

## 2019-02-17 LAB — AMMONIA: Ammonia: 16 umol/L (ref 9–35)

## 2019-02-17 LAB — HEPARIN LEVEL (UNFRACTIONATED): Heparin Unfractionated: 0.68 IU/mL (ref 0.30–0.70)

## 2019-02-17 MED ORDER — HEPARIN (PORCINE) 25000 UT/250ML-% IV SOLN
1100.0000 [IU]/h | INTRAVENOUS | Status: DC
  Administered 2019-02-17 (×2): 1200 [IU]/h via INTRAVENOUS
  Administered 2019-02-18: 1100 [IU]/h via INTRAVENOUS
  Filled 2019-02-17 (×3): qty 250

## 2019-02-17 MED ORDER — RENA-VITE PO TABS
1.0000 | ORAL_TABLET | Freq: Every day | ORAL | Status: DC
  Filled 2019-02-17 (×2): qty 1

## 2019-02-17 MED ORDER — IOHEXOL 300 MG/ML  SOLN
100.0000 mL | Freq: Once | INTRAMUSCULAR | Status: AC | PRN
  Administered 2019-02-17: 100 mL via INTRAVENOUS

## 2019-02-17 MED ORDER — SODIUM PHOSPHATES 45 MMOLE/15ML IV SOLN
15.0000 mmol | Freq: Once | INTRAVENOUS | Status: DC
  Filled 2019-02-17: qty 5

## 2019-02-17 MED ORDER — HEPARIN BOLUS VIA INFUSION
3600.0000 [IU] | Freq: Once | INTRAVENOUS | Status: AC
  Administered 2019-02-17: 3600 [IU] via INTRAVENOUS
  Filled 2019-02-17: qty 3600

## 2019-02-17 MED ORDER — NEPRO/CARBSTEADY PO LIQD: 237.0000 mL | Freq: Three times a day (TID) | ORAL | Status: DC

## 2019-02-17 MED ORDER — PANTOPRAZOLE SODIUM 40 MG IV SOLR
40.0000 mg | INTRAVENOUS | Status: DC
  Administered 2019-02-18 – 2019-03-06 (×17): 40 mg via INTRAVENOUS
  Filled 2019-02-17 (×18): qty 40

## 2019-02-17 NOTE — Progress Notes (Signed)
Pharmacy Electrolyte Monitoring Consult:  Pharmacy consulted to assist in monitoring and replacing electrolytes in this 63 y.o. female admitted on 02/09/2019. Patient with ESRD; last dialysis 12/14. Patient with MRSA bacteremia awaiting TEE; currently on ceftaroline and daptomycin.   Labs:  Sodium (mmol/L)  Date Value  02/17/2019 139  03/30/2014 140   Potassium (mmol/L)  Date Value  02/17/2019 4.0  03/30/2014 4.3   Magnesium (mg/dL)  Date Value  02/16/2019 1.9  07/19/2013 1.7 (L)   Phosphorus (mg/dL)  Date Value  02/17/2019 1.8 (L)   Calcium (mg/dL)  Date Value  02/17/2019 8.1 (L)   Calcium, Total (mg/dL)  Date Value  03/30/2014 9.0   Albumin (g/dL)  Date Value  02/17/2019 2.0 (L)  02/11/2018 4.2  07/23/2013 2.7 (L)    Assessment/Plan: Will defer replacement at this time. If phosphorus remains low will discuss with Nephro prior to replacing.   Labs in am.   Replace to maintain within normal limits.   Pharmacy will continue to monitor and adjust per consult.   Barbara Ahart L 02/17/2019 5:30 PM

## 2019-02-17 NOTE — Progress Notes (Signed)
Report given to Wende Bushy for patient to be transferred to room 204. Husband notified via phone of transfer.

## 2019-02-17 NOTE — Progress Notes (Signed)
OT Cancellation Note  Patient Details Name: Jamie Arias MRN: [id number] DOB: [birth year]   Cancelled Treatment:    Reason Eval/Treat Not Completed: Patient at procedure or test/ unavailable. Consult received, chart reviewed. RN cleared therapy to see pt. Upon arrival, RN reporting pt now has orders for stat spinal imaging. Wellsite geologist to take her. Will hold OT evaluation this date and re-attempt at later date/time as pt is appropriate.   [name], MPH, MS, OTR/L ascom [phone] 02/17/19, 2:38 PM

## 2019-02-17 NOTE — Progress Notes (Signed)
Nutrition Follow-up  DOCUMENTATION CODES:   Obesity unspecified  INTERVENTION:  Provide Nepro Shake po TID, each supplement provides 425 kcal and 19 grams protein.  Will discontinue liquid MVI.  Provide Rena-vite QHS.  Monitor magnesium, potassium, and phosphorus daily for at least 3 days, MD to replete as needed, as pt is at risk for refeeding syndrome.  NUTRITION DIAGNOSIS:   Increased nutrient needs related to chronic illness(ESRD on HD) as evidenced by estimated needs.  Ongoing.  GOAL:   Patient will meet greater than or equal to 90% of their needs  Progressing.  MONITOR:   PO intake, Supplement acceptance, Labs, Weight trends, Skin, I & O's  REASON FOR ASSESSMENT:   Malnutrition Screening Tool    ASSESSMENT:   63 y.o. female with end-stage renal disease on HD, atrial fibrillation, chronic diastolic congestive heart failure, CAD status post PCI, CABG,  type 2 diabetes, hypertension, hyperlipidemia, hypothyroidism, Covid positive in July 2020 admitted with AMS and found to have MRSA positive blood cultures  Patient was advanced to full liquids on 12/12. She was then made NPO for possible TEE on 12/14 but it has been deferred until patient more alert. Diet was advanced to dysphagia 2 with nectar-thick liquids today following SLP evaluation. She was too sleepy/lethargic this morning to provide any history. Patient will benefit from oral nutrition supplements.  Medications reviewed and include: Epogen 4000 units during HD, Novolog 0-9 units TID, levothyroxine, liquid MVI daily per tube, pantoprazole, zinc sulfate 220 mg daily, ceftaroline, daptomycin, heparin.  Labs reviewed: CBG 105-119, Creatinine 3.27, Phosphorus 1.8.  Discussed with RN.  Diet Order:   Diet Order            DIET DYS 2 Room service appropriate? Yes with Assist; Fluid consistency: Nectar Thick  Diet effective now             EDUCATION NEEDS:   Not appropriate for education at this  time  Skin:  Skin Assessment: Skin Integrity Issues:(stg II right buttocks; DTI right heel; unstageable left heel)  Last BM:  02/17/2019 - type 6  Height:   Ht Readings from Last 1 Encounters:  02/10/19 5\' 4"  (1.626 m)   Weight:   Wt Readings from Last 1 Encounters:  02/12/19 78.2 kg   Ideal Body Weight:  45 kg  BMI:  Body mass index is 29.59 kg/m.  Estimated Nutritional Needs:   Kcal:  1700-1900kcal/day  Protein:  85-95g/day  Fluid:  UOP +1L  Jacklynn Barnacle, MS, RD, LDN Office: [phone] Pager: [phone] After Hours/Weekend Pager: [phone]

## 2019-02-17 NOTE — Progress Notes (Signed)
[place], [place] 02/17/19  Subjective:  Patient underwent hemodialysis yesterday. Tolerated well. Awake and alert this a.m. Ultrasound of the right upper extremity did show a fluid collection but was felt to be more consistent with hematoma rather than abscess.  Objective:  Vital signs in last 24 hours:  Temp:  [98.2 F (36.8 C)-99 F (37.2 C)] 98.2 F (36.8 C) (12/15 0000) Pulse Rate:  [65-73] 69 (12/15 0200) Resp:  [10-22] 17 (12/15 0200) BP: (113-147)/(39-71) 135/39 (12/15 0200) SpO2:  [93 %-97 %] 97 % (12/15 0200)  Weight change:  Filed Weights   02/10/19 1700 02/12/19 1130 02/12/19 1445  Weight: 78.1 kg 78 kg 78.2 kg    Intake/Output:    Intake/Output Summary (Last 24 hours) at 02/17/2019 1043 Last data filed at 02/17/2019 0200 Gross per 24 hour  Intake 613 ml  Output 0 ml  Net 613 ml     Physical Exam: General:  Critically ill-appearing, lying in the bed  HEENT  dry oral mucous membranes  Pulm/lungs  clear bilateral, normal effort  CVS/Heart  S1S2 no rubs  Abdomen:   Soft, nontender  Extremities:  Trace bilateral lower extremity edema  Neurologic:  Awake, alert, follows commands  Skin:  Warm  Access:  AV fistula, right arm       Basic Metabolic Panel:  Recent Labs  Lab 02/12/19 0444 02/13/19 0453 02/14/19 0340 02/15/19 0545 02/16/19 0433 02/17/19 0533  NA 145 143 142 138 139 139  K 2.6* 3.2* 3.1* 3.6 3.4* 4.0  CL 103 103 104 102 102 102  CO2 30 27 26 24 24 25   GLUCOSE 118* 108* 109* 96 166* 101*  BUN 16 17 23  26* 30* 14  CREATININE 3.74* 3.31* 4.03* 4.64* 5.30* 3.27*  CALCIUM 8.2* 8.4* 8.0* 7.9* 8.2* 8.1*  MG 1.7 2.3 2.2 2.2 1.9  --   PHOS 2.0* 1.7* 2.2* 2.2* 2.5 1.8*     CBC: Recent Labs  Lab 02/12/19 0444 02/13/19 0453 02/14/19 0340 02/15/19 0545 02/15/19 1824 02/16/19 0433  WBC 8.2 6.9 8.0 8.9  --  8.9  HGB 7.8* 7.9* 7.2* 6.8* 8.8* 8.7*  HCT 24.4* 24.7* 22.2* 20.0* 25.0* 24.7*  MCV 84.4 86.1  83.8 80.3  --  79.7*  PLT 131* 107* 106* 105*  --  126*      Lab Results  Component Value Date   HEPBSAG NON REACTIVE 02/10/2019      Microbiology:  Recent Results (from the past 240 hour(s))  Culture, blood (routine x 2)     Status: Abnormal   Collection Time: 02/09/19  2:29 PM   Specimen: BLOOD LEFT ARM  Result Value Ref Range Status   Specimen Description   Final    BLOOD LEFT ARM Performed at [facility], [address] [place], [place] [address]    Special Requests   Final    BOTTLES DRAWN AEROBIC AND ANAEROBIC Blood Culture adequate volume Performed at [facility], [place] [address] [place], [place] [address]    Culture  Setup Time   Final    GRAM POSITIVE COCCI IN BOTH AEROBIC AND ANAEROBIC BOTTLES CRITICAL VALUE NOTED.  VALUE IS CONSISTENT WITH PREVIOUSLY REPORTED AND CALLED VALUE. Performed at [facility], [address] [place], [place] [address]    Culture (A)  Final    STAPHYLOCOCCUS AUREUS SUSCEPTIBILITIES PERFORMED ON PREVIOUS CULTURE WITHIN THE LAST 5 DAYS. Performed at [facility], [place] [address], [place],  [address]  Report Status 02/12/2019 FINAL  Final  Culture, blood (routine x 2)     Status: Abnormal   Collection Time: 02/09/19  2:38 PM   Specimen: BLOOD LEFT ARM  Result Value Ref Range Status   Specimen Description   Final    BLOOD LEFT ARM Performed at [facility], [address] [place], [place] [address]    Special Requests   Final    BOTTLES DRAWN AEROBIC AND ANAEROBIC Blood Culture adequate volume Performed at [facility], [address] [place], [place] [address]    Culture  Setup Time   Final    GRAM POSITIVE COCCI IN BOTH AEROBIC AND ANAEROBIC BOTTLES CRITICAL RESULT CALLED TO, READ BACK BY AND VERIFIED WITH: [place] [id number] 02/10/2019 HNM Performed at [facility], [place] [address], [place], [place] [address]    Culture METHICILLIN RESISTANT  STAPHYLOCOCCUS AUREUS (A)  Final   Report Status 02/12/2019 FINAL  Final   Organism ID, Bacteria METHICILLIN RESISTANT STAPHYLOCOCCUS AUREUS  Final      Susceptibility   Methicillin resistant staphylococcus aureus - MIC*    CIPROFLOXACIN >=8 RESISTANT Resistant     ERYTHROMYCIN >=8 RESISTANT Resistant     GENTAMICIN <=0.5 SENSITIVE Sensitive     OXACILLIN >=4 RESISTANT Resistant     TETRACYCLINE <=1 SENSITIVE Sensitive     VANCOMYCIN 1 SENSITIVE Sensitive     TRIMETH/SULFA <=10 SENSITIVE Sensitive     CLINDAMYCIN RESISTANT Resistant     RIFAMPIN <=0.5 SENSITIVE Sensitive     Inducible Clindamycin POSITIVE Resistant     * METHICILLIN RESISTANT STAPHYLOCOCCUS AUREUS  Blood Culture ID Panel (Reflexed)     Status: Abnormal   Collection Time: 02/09/19  2:38 PM  Result Value Ref Range Status   Enterococcus species NOT DETECTED NOT DETECTED Final   Listeria monocytogenes NOT DETECTED NOT DETECTED Final   Staphylococcus species DETECTED (A) NOT DETECTED Final    Comment: CRITICAL RESULT CALLED TO, READ BACK BY AND VERIFIED WITH: Hart Robinsons PHARMD [id number] 02/10/2019 HNM    Staphylococcus aureus (BCID) DETECTED (A) NOT DETECTED Final    Comment: Methicillin (oxacillin)-resistant Staphylococcus aureus (MRSA). MRSA is predictably resistant to beta-lactam antibiotics (except ceftaroline). Preferred therapy is vancomycin unless clinically contraindicated. Patient requires contact precautions if  hospitalized. CRITICAL RESULT CALLED TO, READ BACK BY AND VERIFIED WITH: Hart Robinsons PHARMD [id number] 02/10/2019 HNM    Methicillin resistance DETECTED (A) NOT DETECTED Final    Comment: CRITICAL RESULT CALLED TO, READ BACK BY AND VERIFIED WITH: Hart Robinsons PHARMD [id number] 02/10/2019 HNM    Streptococcus species NOT DETECTED NOT DETECTED Final   Streptococcus agalactiae NOT DETECTED NOT DETECTED Final   Streptococcus pneumoniae NOT DETECTED NOT DETECTED Final   Streptococcus pyogenes NOT DETECTED NOT DETECTED Final    Acinetobacter baumannii NOT DETECTED NOT DETECTED Final   Enterobacteriaceae species NOT DETECTED NOT DETECTED Final   Enterobacter cloacae complex NOT DETECTED NOT DETECTED Final   Escherichia coli NOT DETECTED NOT DETECTED Final   Klebsiella oxytoca NOT DETECTED NOT DETECTED Final   Klebsiella pneumoniae NOT DETECTED NOT DETECTED Final   Proteus species NOT DETECTED NOT DETECTED Final   Serratia marcescens NOT DETECTED NOT DETECTED Final   Haemophilus influenzae NOT DETECTED NOT DETECTED Final   Neisseria meningitidis NOT DETECTED NOT DETECTED Final   Pseudomonas aeruginosa NOT DETECTED NOT DETECTED Final   Candida albicans NOT DETECTED NOT DETECTED Final   Candida glabrata NOT DETECTED NOT DETECTED Final   Candida krusei  NOT DETECTED NOT DETECTED Final   Candida parapsilosis NOT DETECTED NOT DETECTED Final   Candida tropicalis NOT DETECTED NOT DETECTED Final    Comment: Performed at [facility], [place], [place] [address]  SARS CORONAVIRUS 2 (TAT 6-24 HRS) Nasopharyngeal Nasopharyngeal Swab     Status: None   Collection Time: 02/09/19  2:54 PM   Specimen: Nasopharyngeal Swab  Result Value Ref Range Status   SARS Coronavirus 2 NEGATIVE NEGATIVE Final    Comment: (NOTE) SARS-CoV-2 target nucleic acids are NOT DETECTED. The SARS-CoV-2 RNA is generally detectable in upper and lower respiratory specimens during the acute phase of infection. Negative results do not preclude SARS-CoV-2 infection, do not rule out co-infections with other pathogens, and should not be used as the sole basis for treatment or other patient management decisions. Negative results must be combined with clinical observations, patient history, and epidemiological information. The expected result is Negative. Fact Sheet for Patients: SugarRoll.be Fact Sheet for Healthcare Providers: https://www.woods-mathews.com/ This test is not yet approved or  cleared by the [place] FDA and  has been authorized for detection and/or diagnosis of SARS-CoV-2 by FDA under an Emergency Use Authorization (EUA). This EUA will remain  in effect (meaning this test can be used) for the duration of the COVID-19 declaration under Section 56 4(b)(1) of the Act, 21 U.S.C. section 360bbb-3(b)(1), unless the authorization is terminated or revoked sooner. Performed at [facility], [place] [address], [place], [place] [address]   MRSA PCR Screening     Status: Abnormal   Collection Time: 02/10/19  5:03 PM   Specimen: Nasopharyngeal  Result Value Ref Range Status   MRSA by PCR POSITIVE (A) NEGATIVE Final    Comment:        The GeneXpert MRSA Assay (FDA approved for NASAL specimens only), is one component of a comprehensive MRSA colonization surveillance program. It is not intended to diagnose MRSA infection nor to guide or monitor treatment for MRSA infections. RESULT CALLED TO, READ BACK BY AND VERIFIED WITH: Britt Boozer @1839  02/10/19 MJU Performed at [facility], [address] [place], [place] [address]   Culture, blood (routine x 2)     Status: Abnormal   Collection Time: 02/11/19 12:31 PM   Specimen: BLOOD  Result Value Ref Range Status   Specimen Description   Final    BLOOD PORTA CATH Performed at [place] [address], [place], [place] [address]    Special Requests   Final    BOTTLES DRAWN AEROBIC AND ANAEROBIC Blood Culture adequate volume Performed at [facility], [address] [place], [place] [address]    Culture  Setup Time   Final    GRAM POSITIVE COCCI ANAEROBIC BOTTLE ONLY CRITICAL RESULT CALLED TO, READ BACK BY AND VERIFIED WITH: SCOTT HALL AT [id number] 02/12/2019 [facility]  Performed at [facility], [address] [place], [place] [address]    Culture (A)  Final    STAPHYLOCOCCUS AUREUS SUSCEPTIBILITIES PERFORMED ON PREVIOUS CULTURE WITHIN THE LAST 5 DAYS. Performed at  [facility], [place] [address] [place], [place] [address]    Report Status 02/13/2019 FINAL  Final  Culture, blood (routine x 2)     Status: None   Collection Time: 02/11/19  3:57 PM   Specimen: BLOOD  Result Value Ref Range Status   Specimen Description BLOOD PORTA CATH  Final   Special Requests   Final    BOTTLES  DRAWN AEROBIC AND ANAEROBIC Blood Culture adequate volume   Culture   Final    NO GROWTH 5 DAYS Performed at [facility], [address] [place], [place] [address]    Report Status 02/16/2019 FINAL  Final  CULTURE, BLOOD (ROUTINE X 2) w Reflex to ID Panel     Status: Abnormal   Collection Time: 02/13/19  2:18 PM   Specimen: BLOOD LEFT HAND  Result Value Ref Range Status   Specimen Description BLOOD LEFT HAND  Final   Special Requests   Final    BOTTLES DRAWN AEROBIC AND ANAEROBIC Blood Culture results may not be optimal due to an inadequate volume of blood received in culture bottles   Culture  Setup Time   Final    AEROBIC BOTTLE ONLY GRAM POSITIVE COCCI IN CLUSTERS CRITICAL RESULT CALLED TO, READ BACK BY AND VERIFIED WITH: Herbert Pun AT [id number] ON 02/14/2019 [place]. Performed at [facility], [place] [address] [place], [place] [address]    Culture METHICILLIN RESISTANT STAPHYLOCOCCUS AUREUS (A)  Final   Report Status 02/16/2019 FINAL  Final   Organism ID, Bacteria METHICILLIN RESISTANT STAPHYLOCOCCUS AUREUS  Final      Susceptibility   Methicillin resistant staphylococcus aureus - MIC*    CIPROFLOXACIN >=8 RESISTANT Resistant     ERYTHROMYCIN >=8 RESISTANT Resistant     GENTAMICIN <=0.5 SENSITIVE Sensitive     OXACILLIN >=4 RESISTANT Resistant     TETRACYCLINE <=1 SENSITIVE Sensitive     VANCOMYCIN <=0.5 SENSITIVE Sensitive     TRIMETH/SULFA <=10 SENSITIVE Sensitive     CLINDAMYCIN RESISTANT Resistant     RIFAMPIN <=0.5 SENSITIVE Sensitive     Inducible Clindamycin POSITIVE Resistant     * METHICILLIN RESISTANT STAPHYLOCOCCUS AUREUS  Blood  Culture ID Panel (Reflexed)     Status: Abnormal   Collection Time: 02/13/19  2:18 PM  Result Value Ref Range Status   Enterococcus species NOT DETECTED NOT DETECTED Final   Listeria monocytogenes NOT DETECTED NOT DETECTED Final   Staphylococcus species DETECTED (A) NOT DETECTED Final    Comment: CRITICAL RESULT CALLED TO, READ BACK BY AND VERIFIED WITH: ABBEY ELLINGTON AT [id number] ON 02/14/2019 [place].    Staphylococcus aureus (BCID) DETECTED (A) NOT DETECTED Final    Comment: Methicillin (oxacillin)-resistant Staphylococcus aureus (MRSA). MRSA is predictably resistant to beta-lactam antibiotics (except ceftaroline). Preferred therapy is vancomycin unless clinically contraindicated. Patient requires contact precautions if  hospitalized. CRITICAL RESULT CALLED TO, READ BACK BY AND VERIFIED WITH: Herbert Pun AT [id number] ON 02/14/2019 [place].    Methicillin resistance DETECTED (A) NOT DETECTED Final    Comment: CRITICAL RESULT CALLED TO, READ BACK BY AND VERIFIED WITH: Herbert Pun AT [id number] ON 02/14/2019 [place].    Streptococcus species NOT DETECTED NOT DETECTED Final   Streptococcus agalactiae NOT DETECTED NOT DETECTED Final   Streptococcus pneumoniae NOT DETECTED NOT DETECTED Final   Streptococcus pyogenes NOT DETECTED NOT DETECTED Final   Acinetobacter baumannii NOT DETECTED NOT DETECTED Final   Enterobacteriaceae species NOT DETECTED NOT DETECTED Final   Enterobacter cloacae complex NOT DETECTED NOT DETECTED Final   Escherichia coli NOT DETECTED NOT DETECTED Final   Klebsiella oxytoca NOT DETECTED NOT DETECTED Final   Klebsiella pneumoniae NOT DETECTED NOT DETECTED Final   Proteus species NOT DETECTED NOT DETECTED Final   Serratia marcescens NOT DETECTED NOT DETECTED Final   Haemophilus influenzae NOT DETECTED NOT DETECTED Final   Neisseria meningitidis NOT DETECTED NOT DETECTED Final   Pseudomonas aeruginosa  NOT DETECTED NOT DETECTED Final   Candida albicans NOT DETECTED NOT DETECTED Final    Candida glabrata NOT DETECTED NOT DETECTED Final   Candida krusei NOT DETECTED NOT DETECTED Final   Candida parapsilosis NOT DETECTED NOT DETECTED Final   Candida tropicalis NOT DETECTED NOT DETECTED Final    Comment: Performed at [facility], [address] [place], [place] [address]  CULTURE, BLOOD (ROUTINE X 2) w Reflex to ID Panel     Status: None (Preliminary result)   Collection Time: 02/13/19  3:09 PM   Specimen: BLOOD  Result Value Ref Range Status   Specimen Description BLOOD BLOOD LEFT HAND  Final   Special Requests   Final    BOTTLES DRAWN AEROBIC AND ANAEROBIC Blood Culture adequate volume   Culture   Final    NO GROWTH 4 DAYS Performed at [facility], [address], [place], [place] [address]    Report Status PENDING  Incomplete  Culture, blood (routine x 2)     Status: Abnormal (Preliminary result)   Collection Time: 02/15/19 12:49 PM   Specimen: BLOOD  Result Value Ref Range Status   Specimen Description   Final    BLOOD LT HAND Performed at [facility], [address] [place], [place] [address]    Special Requests   Final    BOTTLES DRAWN AEROBIC AND ANAEROBIC Blood Culture results may not be optimal due to an inadequate volume of blood received in culture bottles Performed at [facility], [address] [place], [place] [address]    Culture  Setup Time   Final    GRAM POSITIVE COCCI IN BOTH AEROBIC AND ANAEROBIC BOTTLES CRITICAL RESULT CALLED TO, READ BACK BY AND VERIFIED WITHEleonore Chiquito AT [id number] 02/16/2019 [place] Performed at [facility], [address], [place], [place] [address]    Culture STAPHYLOCOCCUS AUREUS (A)  Final   Report Status PENDING  Incomplete    Coagulation Studies: No results for input(s): LABPROT, INR in the last 72 hours.  Urinalysis: No results for input(s): COLORURINE, LABSPEC, PHURINE, GLUCOSEU, HGBUR, BILIRUBINUR, KETONESUR, PROTEINUR, UROBILINOGEN, NITRITE, LEUKOCYTESUR  in the last 72 hours.  Invalid input(s): APPERANCEUR    Imaging: [place] RT UPPER EXTREM LTD SOFT TISSUE NON VASCULAR  Result Date: 02/17/2019 CLINICAL DATA:  Evaluate for abscess around AV fistula. MRSA bacteremia. EXAM: ULTRASOUND RIGHT UPPER EXTREMITY LIMITED TECHNIQUE: Ultrasound examination of the upper extremity soft tissues was performed in the area of clinical concern. COMPARISON:  None. FINDINGS: Visualized portions of the AV fistula are patent. There is a 4.6 x 3.1 x 3.1 cm hypoechoic fluid collection surrounding the distal fistula. There is no internal vascularity or surrounding hyperemia. IMPRESSION: 1. 4.6 x 3.1 x 3.1 cm hypoechoic fluid collection surrounding the distal AV fistula. Appearance is more suggestive of a small hematoma given the lack of surrounding hyperemia, especially if this is a common site of fistula access. Superimposed infection is thought to be unlikely, but cannot be entirely excluded by imaging alone. Electronically Signed   By: Titus Dubin M.D.   On: 02/17/2019 07:41     Medications:   . sodium chloride 250 mL (02/10/19 0054)  . ceFTAROline (TEFLARO) IV Stopped (02/16/19 2228)  . [START ON 02/18/2019] DAPTOmycin (CUBICIN)  IV     . aspirin  81 mg Per Tube Daily  . atorvastatin  40 mg Per Tube q1800  . chlorhexidine  15 mL Mouth Rinse BID  . Chlorhexidine Gluconate Cloth  6 each Topical Q0600  . epoetin (EPOGEN/PROCRIT) injection  4,000 Units Intravenous Q M,W,F-HD  . FLUoxetine  20 mg Per Tube Daily  . fluticasone  2 spray Each Nare Daily  . heparin injection (subcutaneous)  5,000 Units Subcutaneous Q8H  . insulin aspart  0-9 Units Subcutaneous TID WC  . levothyroxine  88 mcg Per Tube QAC breakfast  . mouth rinse  15 mL Mouth Rinse q12n4p  . midodrine  10 mg Per Tube TID WC  . multivitamin  15 mL Per Tube Daily  . pantoprazole (PROTONIX) IV  40 mg Intravenous Q24H  . phosphorus  500 mg Per Tube Once  . sodium chloride flush  3 mL Intravenous Once   . venlafaxine  75 mg Per Tube BID WC  . zinc sulfate  220 mg Per Tube Daily   sodium chloride, ondansetron (ZOFRAN) IV  Assessment/ Plan:  63 y.o.caucasian female with end-stage renal disease, atrial fibrillation, chronic diastolic congestive heart failure, CAD status post PCI, CABG,  type 2 diabetes, hypertension, hyperlipidemia, hypothyroidism, Covid positive in July 2020, recent nursing home stay in [place]  NEPHROLOGIST: Lavonia Dana MD  LOCATION: [place]: M-W-F 2nd Shift  EDW: 81.5 kg.  KIDNEY: [place] Optiflux 180NR  LITERS PROC: liters/treatment  HD TIME: 225  ACCESS: R Upper Arm AVF  NEEDLE SIZE:  ANTICOAG: Custom Heparin 2000  BATH: 2K/2.5Ca  QB: 400 ml/min  QD: 600 ml/min   1.  End-stage renal disease -Patient completed dialysis yesterday.  Tolerated well.  No urgent indication for dialysis today.  We will plan for hemodialysis again tomorrow.  2.  Anemia of chronic kidney disease anemia of chronic kidney disease Lab Results  Component Value Date   HGB 8.7 (L) 02/16/2019  -Continue Epogen 4000 units IV with dialysis.  3. SHPTH -recheck serum phosphorus tomorrow during dialysis treatment.  4. Altered mental status -Appears to be a bit more awake and alert this a.m.  Following commands.  5. Sepsis - blood culture positive for MRSA from 02/09/2019  ? Source; ID team following Patient started on ceftaroline and on Cubicin. Ultrasound of right upper extremity appears more consistent with hematoma rather than infection however infection not totally excluded.  Further evaluation management per infectious disease.  6. Hypokalemia Serum potassium 4.0 this a.m.      LOS: 8 Basilia Stuckert 12/15/202010:43 AM  [place], [place]  Note: This note was prepared with Dragon dictation. Any transcription errors are unintentional

## 2019-02-17 NOTE — Progress Notes (Addendum)
ID Pt more talkative , but perseverating and confused As per nurse no diarrhea  She has not eaten anything Speech to assess her  BP (!) 138/54   Pulse 74   Temp 98.9 F (37.2 C) (Oral)   Resp 19   Ht 5\' 4"  (1.626 m)   Wt 78.2 kg   SpO2 99%   BMI 29.59 kg/m    Pt is lying with eyes closed but talking - but  No distress Involuntary movts of the jaw and rt arm Chest b/l air entry HSs 1s2 Abd soft Rt femoral line removed Upper extremities- bruising at the left side and more swelling of the left arm at the site of prior IV line Left heel eschar - with purulent discharge-  CBC Latest Ref Rng & Units 02/16/2019 02/15/2019 02/15/2019  WBC 4.0 - 10.5 K/uL 8.9 - 8.9  Hemoglobin 12.0 - 15.0 g/dL 8.7(L) 8.8(L) 6.8(L)  Hematocrit 36.0 - 46.0 % 24.7(L) 25.0(L) 20.0(L)  Platelets 150 - 400 K/uL 126(L) - 105(L)    CMP Latest Ref Rng & Units 02/17/2019 02/16/2019 02/15/2019  Glucose 70 - 99 mg/dL 101(H) 166(H) 96  BUN 8 - 23 mg/dL 14 30(H) 26(H)  Creatinine 0.44 - 1.00 mg/dL 3.27(H) 5.30(H) 4.64(H)  Sodium 135 - 145 mmol/L 139 139 138  Potassium 3.5 - 5.1 mmol/L 4.0 3.4(L) 3.6  Chloride 98 - 111 mmol/L 102 102 102  CO2 22 - 32 mmol/L 25 24 24   Calcium 8.9 - 10.3 mg/dL 8.1(L) 8.2(L) 7.9(L)  Total Protein 6.5 - 8.1 g/dL - - -  Total Bilirubin 0.3 - 1.2 mg/dL - - -  Alkaline Phos 38 - 126 U/L - - -  AST 15 - 41 U/L - - -  ALT 0 - 44 U/L - - -   Blood culture- 12/7, 12/9, 12/11, 12/13 MRSA bacteremia   Impression/recommendation  MRSA bacteremia :: persistent -- source ? Left heel ulcer ??  AV fistula site has only hematoma Need to rule out endocarditis Need to r/o spinal pathology Vancomycin changed to  ceftaroline + daptomycin on 02/16/19 TEE Imaging of the spine Podiatry consult for the left heel Wound culture  Encephalopathy - likely metabolic /infection V No Acute CVA in the MRI   Post covid morbidity and deconditioning  Diarrhea-better Likely due to the MRSA  infection  With no wbc , no abdominal pain Ladon Applebaum Levie Heritage cdiff unlikley   ESRD on dialysis thru fistula rt arm  Age indeterminate DVT left brachial vein -on IV heparin  Hypothyroidism- pt not taking any oral meds- the staff were unable to place [place] before  Discussed the management with her nurses and her husband

## 2019-02-17 NOTE — Progress Notes (Signed)
[facility]  Date: 02/17/19 Time: 9:34 AM  Patient reevaluated this morning for possible TEE.  She remains somnolent but intermittently opens eyes to voice and answers some questions.  She is not oriented.  I recommend deferring TEE at least one more day to allow her mental status to improve and minimize risk of TEE with moderate sedation.  She also cannot provide informed consent for herself at this time.  Nelva Bush, MD [facility]

## 2019-02-17 NOTE — Evaluation (Signed)
Clinical/Bedside Swallow Evaluation Patient Details  Name: Jamie Arias MRN: [id number] Date of Birth: 1955-05-30  Today's Date: 02/17/2019 Time: SLP Start Time (ACUTE ONLY): 1400 SLP Stop Time (ACUTE ONLY): 1500 SLP Time Calculation (min) (ACUTE ONLY): 60 min  Past Medical History:  Past Medical History:  Diagnosis Date  . Anemia   . Chronic diastolic CHF (congestive heart failure) ([place])    a. Due to ischemic cardiomyopathy. EF as low as 35%, improved to normal s/p CABG; b. echo 07/06/13: EF 55-60%, no RWMAs, mod TR, trivial pericardial effusion not c/w tamponade physiology;  c. 10/2015 Echo: EF 65%, Gr1 DD, triv AI, mild MR, mildly dil LA, mod TR, PASP 33mmHg.  Marland Kitchen Coronary artery disease    a. NSTEMI 06/2013; b.cath: severe three-vessel CAD w/ EF 30% & mild-mod MR; c. s/p 3 vessel CABG 07/02/13 (LIMA-LAD, SVG-OM, and SVG-RPDA);  d. 10/2015 MV: no ischemia/infarct.  . Diabetes mellitus without complication ([name])   . Diabetic neuropathy ([name])   . Dialysis patient [facility])    MWF  . ESRD (end stage renal disease) ([name])    a. 12/2015 initiated - mwf dialysis.  Marland Kitchen GERD (gastroesophageal reflux disease)   . Hyperlipidemia   . Hypertension   . Hypothyroidism   . Myocardial infarction ([name]) 2015  . Neuropathy   . Pleural effusion 2015  . Pulmonary hypertension ([name])   . Renal insufficiency   . Wears dentures    full lower   Past Surgical History:  Past Surgical History:  Procedure Laterality Date  . A/V FISTULAGRAM Right 12/17/2016   Procedure: A/V Fistulagram;  Surgeon: Algernon Huxley, MD;  Location: [place] [facility];  Service: Cardiovascular;  Laterality: Right;  . A/V FISTULAGRAM Right 01/07/2017   Procedure: A/V Fistulagram;  Surgeon: Algernon Huxley, MD;  Location: [place] [facility];  Service: Cardiovascular;  Laterality: Right;  . A/V FISTULAGRAM Right 12/03/2017   Procedure: A/V FISTULAGRAM;  Surgeon: Katha Cabal, MD;  Location: [place] [facility];  Service: Cardiovascular;   Laterality: Right;  . A/V SHUNT INTERVENTION N/A 02/22/2017   Procedure: A/V SHUNT INTERVENTION;  Surgeon: Algernon Huxley, MD;  Location: [place] [facility];  Service: Cardiovascular;  Laterality: N/A;  . AV FISTULA PLACEMENT Right 02/03/2016   Procedure: INSERTION OF ARTERIOVENOUS (AV) GORE-TEX GRAFT ARM ( BRACH / AXILLARY );  Surgeon: Katha Cabal, MD;  Location: [facility];  Service: Vascular;  Laterality: Right;  . CARDIAC CATHETERIZATION    . CATARACT EXTRACTION Bilateral   . CHOLECYSTECTOMY N/A 12/09/2014   Procedure: LAPAROSCOPIC CHOLECYSTECTOMY;  Surgeon: Marlyce [name], MD;  Location: [facility];  Service: General;  Laterality: N/A;  . CORONARY ARTERY BYPASS GRAFT N/A 07/02/2013   Procedure: CORONARY ARTERY BYPASS GRAFTING (CABG);  Surgeon: Ivin [name], MD;  Location: [place];  Service: Open Heart Surgery;  Laterality: N/A;  CABG x three, using left internal mammary artery and right leg greater saphenous vein harvested endoscopically  . ESOPHAGOGASTRODUODENOSCOPY (EGD) WITH PROPOFOL N/A 11/24/2015   Procedure: ESOPHAGOGASTRODUODENOSCOPY (EGD) WITH PROPOFOL;  Surgeon: [name], MD;  Location: [place];  Service: Endoscopy;  Laterality: N/A;  Diabetic - insulin  . EYE SURGERY Bilateral    Cataract Extraction with IOL  . INTRAOPERATIVE TRANSESOPHAGEAL ECHOCARDIOGRAM N/A 07/02/2013   Procedure: INTRAOPERATIVE TRANSESOPHAGEAL ECHOCARDIOGRAM;  Surgeon: Ivin [name], MD;  Location: [place];  Service: Open Heart Surgery;  Laterality: N/A;  . PERIPHERAL VASCULAR CATHETERIZATION Right 12/06/2015   Procedure: Dialysis/Perma Catheter Insertion;  Surgeon:  Katha Cabal, MD;  Location: [place] [facility];  Service: Cardiovascular;  Laterality: Right;  . PERIPHERAL VASCULAR THROMBECTOMY Right 09/28/2016   Procedure: Peripheral Vascular Thrombectomy;  Surgeon: Katha Cabal, MD;  Location: [place] [facility];  Service: Cardiovascular;  Laterality: Right;  . PERIPHERAL  VASCULAR THROMBECTOMY Right 05/20/2018   Procedure: PERIPHERAL VASCULAR THROMBECTOMY;  Surgeon: Katha Cabal, MD;  Location: [place] [facility];  Service: Cardiovascular;  Laterality: Right;  . PORTA CATH REMOVAL N/A 06/01/2016   Procedure: Glori [name] Cath Removal;  Surgeon: Katha Cabal, MD;  Location: [place] [facility];  Service: Cardiovascular;  Laterality: N/A;  . THORACENTESIS Left 2015   HPI:  Pt is a 63 y.o. female with medical history significant of ESRD on HD MWF, anemia, chronic diastolic congestive heart failure, CAD status post PCI, type 2 diabetes, hypertension, hyperlipidemia, hypothyroidism, and conditions listed below presenting via EMS after she was found to be somewhat less responsive since last night and this morning.  According to husband patient was having stomach upset all day yesterday.  And patient has had excessive sweating overnight.  Patient was this morning noticed to be less responsive per her husband and therefore he called EMS to send her to the hospital.  And has been weak on lower extremity since her last discharge from in July from Covid pneumonia.  Has been patient did not have any shortness of breath, cough, seizure-like activity.Subjective fever as patient was sweating overnight.  EMS was called and who brought the patient's here.   Pt was admitted in in June for COVID-19 pneumonia and had a prolonged hospitalization before discharge in late July.  Patient was receiving home health care.  However as per record, patient appears to have failed to follow-up with her PCP.  MRI results: "No acute intracranial abnormality"   Assessment / Plan / Recommendation Clinical Impression  Pt appears to present w/ oropharyngeal phase dysphagia impacted by her decline in Mentation/Cognition, declined medical status, and oral phase deficits. This increases her risk for prandial aspiration. The risk can be reduced following aspiration precautions when feeding pt.  SLP Visit  Diagnosis: Dysphagia, oropharyngeal phase (R13.12)(declined cognitive status/mentation)    Aspiration Risk  Mild aspiration risk;Risk for inadequate nutrition/hydration    Diet Recommendation  Dysphagia level 2 (MINCED foods w/ gravies); Nectar liquids. Aspiration precautions; Feeding Support at meals -- reduce Distractions during meal  Medication Administration: Whole meds with puree(vs need for Crushed in puree)    Other  Recommendations Recommended Consults: (Dietician f/u) Oral Care Recommendations: Oral care BID;Staff/trained caregiver to provide oral care;Oral care before and after PO Other Recommendations: Order thickener from pharmacy;Prohibited food (jello, ice cream, thin soups);Remove water pitcher;Have oral suction available   Follow up Recommendations Skilled Nursing facility(TBD)      Frequency and Duration min 3x week  2 weeks       Prognosis Prognosis for Safe Diet Advancement: Fair Barriers to Reach Goals: Cognitive deficits;Time post onset;Severity of deficits      Swallow Study   General Date of Onset: 02/09/19 HPI: Pt is a 63 y.o. female with medical history significant of ESRD on HD MWF, anemia, chronic diastolic congestive heart failure, CAD status post PCI, type 2 diabetes, hypertension, hyperlipidemia, hypothyroidism, and conditions listed below presenting via EMS after she was found to be somewhat less responsive since last night and this morning.  According to husband patient was having stomach upset all day yesterday.  And patient has had excessive sweating overnight.  Patient was this morning noticed to be less responsive per her husband and therefore he called EMS to send her to the hospital.  And has been weak on lower extremity since her last discharge from in July from Covid pneumonia.  Has been patient did not have any shortness of breath, cough, seizure-like activity.Subjective fever as patient was sweating overnight.  EMS was called and who brought the  patient's here.   Pt was admitted in in June for COVID-19 pneumonia and had a prolonged hospitalization before discharge in late July.  Patient was receiving home health care.  However as per record, patient appears to have failed to follow-up with her PCP.  MRI results: "No acute intracranial abnormality" Type of Study: Bedside Swallow Evaluation Previous Swallow Assessment: none Diet Prior to this Study: NPO Temperature Spikes Noted: No(wbc 8.9) Respiratory Status: Room air History of Recent Intubation: No Behavior/Cognition: Alert;Cooperative;Pleasant mood;Confused;Distractible;Requires cueing Oral Cavity Assessment: Within Functional Limits Oral Care Completed by SLP: Yes Oral Cavity - Dentition: Dentures, bottom(dentition) Vision: (n/a) Self-Feeding Abilities: Total assist Patient Positioning: Upright in bed(needed positioning) Baseline Vocal Quality: Normal;Low vocal intensity(min) Volitional Cough: Weak Volitional Swallow: Able to elicit(cued)    Oral/Motor/Sensory Function Overall Oral Motor/Sensory Function: Within functional limits   Ice Chips Ice chips: Within functional limits Presentation: Spoon(fed; 6 trials) Other Comments: chewed the ice   Thin Liquid Thin Liquid: Not tested    Nectar Thick Nectar Thick Liquid: Within functional limits Presentation: Spoon;Straw(5 trials via straw; 2 trials via spoon) Other Comments: min cues d/t oral prep stage (sucking on straw)   Honey Thick Honey Thick Liquid: Not tested   Puree Puree: Within functional limits Presentation: Spoon(fed; 6 trials)   Solid     Solid: Impaired Presentation: Spoon(fed; 4 trials) Oral Phase Impairments: Reduced labial seal;Reduced lingual movement/coordination;Impaired mastication(munching pattern) Oral Phase Functional Implications: Prolonged oral transit;Impaired mastication;Oral residue Pharyngeal Phase Impairments: (none) Other Comments: alternating foods/liquids aided clearing        Orinda Kenner, MS, CCC-SLP Darnetta Kesselman 02/17/2019,4:41 PM

## 2019-02-17 NOTE — Progress Notes (Signed)
PROGRESS NOTE    Jamie Arias  [id number] DOB: 01-Mar-1956 DOA: 02/09/2019 PCP: [name], MD    Brief Narrative:  63 year old female with end-stage renal disease on hemodialysis, admitted to the hospital with severe septic shock secondary to MRSA bacteremia. She had associated metabolic encephalopathy. She is being treated with intravenous vancomycin. She is not requiring any further pressors. Encephalopathy has been slow to improve.  Bacteremia has been persistent without any clear etiology.  Further work-up with TEE and CT of the spine have been requested.  Infectious disease following.   Assessment & Plan:   Principal Problem:   Sepsis due to methicillin resistant Staphylococcus aureus (MRSA) ([place]) Active Problems:   Diabetes mellitus, type 2 (HCC)   Hypotension   Chronic diastolic CHF (congestive heart failure) (HCC)   ESRD (end stage renal disease) (HCC)   Altered level of consciousness   Pressure injury of skin   MRSA bacteremia   Arm DVT (deep venous thromboembolism), acute, left (HCC)   Acute encephalopathy   1. Septic shock secondary to MRSA bacteremia. Currently on vancomycin. Infectious disease following. 2D echocardiogram does not show any evidence of vegetations. TEE has been requested, discussed with cardiology. Overall hemodynamics are improving. She is not requiring any vasopressor support. Etiology of bacteremia is not entirely clear.  Patient has had persistent bacteremia despite IV vancomycin for 7 days.  ID following and vancomycin has been changed to daptomycin and Teflaro.  Ultrasound of right upper extremity ordered to evaluate AV graft which shows hematoma.  CT imaging of spine ordered to evaluate for spinal infection. 2. End-stage renal disease on hemodialysis. Nephrology following for dialysis needs.  3. Acute encephalopathy. Suspect this is related to sepsis. Overall her mental status appears to be improving. MRI of the brain did not show any  acute infarct. EEG did not show any evidence of seizures. She was noted to have twitching in her lower jaw, but her husband reports this is been present for the past several months.  Ammonia level is normal.  ABG does not show significant CO2 retention. 4. Acute left arm DVT. Noted on venous Dopplers. Started on heparin infusion, but this was held due to oozing around central line.  He had a central line has been removed, will resume on heparin. 5. History of atrial fibrillation. She was taking amiodarone and Eliquis prior to admission. Currently in sinus rhythm. Continue to follow heart rate. She will be anticoagulated for her venous thromboembolism. 6. Chronic diastolic congestive heart failure. Appears to have significant edema. Can hopefully remove volume with dialysis. 7. Diabetes. Continue on sliding scale insulin. Blood sugars are currently stable. 8. Acute on chronic anemia. Related to blood loss from oozing around IV sites. Improved after 1 unit prbc on 12/13 9. Goals of care.  With patient's multiple medical issues, poor functional nutritional status, her prognosis is poor.  Request palliative care to address goals of care   DVT prophylaxis: Heparin  Code Status: Full code Family Communication: none present Disposition Plan: May need placement pending hospital course   Consultants:   ID  Nephrology  Procedures:   Right femoral CVC 12/8> removed  Right femoral art line 12/8> removed  Antimicrobials:   Vancomycin 12/7>12/14  Daptomycin 12/14>  ceftaroline 12/14>   Subjective: Patient is somnolent. She opens her eyes to voice and answers questions. She denies any pain or shortness of breath  Objective: Vitals:   02/17/19 1000 02/17/19 1100 02/17/19 1200 02/17/19 1300  BP: (!) 134/49 Marland Kitchen)  136/50 (!) 151/51 (!) 138/54  Pulse: 66 69 71 74  Resp: 10 13 19 19   Temp:   98.9 F (37.2 C)   TempSrc:   Oral   SpO2: 96% 98% 97% 99%  Weight:      Height:         Intake/Output Summary (Last 24 hours) at 02/17/2019 1848 Last data filed at 02/17/2019 1200 Gross per 24 hour  Intake 353.27 ml  Output --  Net 353.27 ml   Filed Weights   02/10/19 1700 02/12/19 1130 02/12/19 1445  Weight: 78.1 kg 78 kg 78.2 kg    Examination:  General exam: somnolent, but wakes up to voice Respiratory system: diminished breath sounds bilaterally. Respiratory effort normal. Cardiovascular system:RRR. No murmurs, rubs, gallops. Gastrointestinal system: Abdomen is nondistended, soft and nontender. No organomegaly or masses felt. Normal bowel sounds heard. Central nervous system: No focal neurological deficits. Extremities: 1-2+ edema bilaterally Skin: No rashes, lesions or ulcers Psychiatry: somnolent.    Data Reviewed: I have personally reviewed following labs and imaging studies  CBC: Recent Labs  Lab 02/12/19 0444 02/13/19 0453 02/14/19 0340 02/15/19 0545 02/15/19 1824 02/16/19 0433  WBC 8.2 6.9 8.0 8.9  --  8.9  HGB 7.8* 7.9* 7.2* 6.8* 8.8* 8.7*  HCT 24.4* 24.7* 22.2* 20.0* 25.0* 24.7*  MCV 84.4 86.1 83.8 80.3  --  79.7*  PLT 131* 107* 106* 105*  --  123XX123*   Basic Metabolic Panel: Recent Labs  Lab 02/12/19 0444 02/13/19 0453 02/14/19 0340 02/15/19 0545 02/16/19 0433 02/17/19 0533  NA 145 143 142 138 139 139  K 2.6* 3.2* 3.1* 3.6 3.4* 4.0  CL 103 103 104 102 102 102  CO2 30 27 26 24 24 25   GLUCOSE 118* 108* 109* 96 166* 101*  BUN 16 17 23  26* 30* 14  CREATININE 3.74* 3.31* 4.03* 4.64* 5.30* 3.27*  CALCIUM 8.2* 8.4* 8.0* 7.9* 8.2* 8.1*  MG 1.7 2.3 2.2 2.2 1.9  --   PHOS 2.0* 1.7* 2.2* 2.2* 2.5 1.8*   GFR: Estimated Creatinine Clearance: 17.4 mL/min (A) (by C-G formula based on SCr of 3.27 mg/dL (H)). Liver Function Tests: Recent Labs  Lab 02/12/19 0444 02/13/19 0453 02/14/19 0340 02/16/19 0433 02/17/19 0533  ALBUMIN 1.9* 2.2* 2.2* 2.1* 2.0*   No results for input(s): LIPASE, AMYLASE in the last 168 hours. Recent Labs   Lab 02/17/19 0533  AMMONIA 16   Coagulation Profile: No results for input(s): INR, PROTIME in the last 168 hours. Cardiac Enzymes: Recent Labs  Lab 02/16/19 0433  CKTOTAL 17*   BNP (last 3 results) No results for input(s): PROBNP in the last 8760 hours. HbA1C: No results for input(s): HGBA1C in the last 72 hours. CBG: Recent Labs  Lab 02/16/19 1206 02/16/19 2117 02/17/19 0744 02/17/19 1123 02/17/19 1635  GLUCAP 163* 78 105* 119* 153*   Lipid Profile: No results for input(s): CHOL, HDL, LDLCALC, TRIG, CHOLHDL, LDLDIRECT in the last 72 hours. Thyroid Function Tests: No results for input(s): TSH, T4TOTAL, FREET4, T3FREE, THYROIDAB in the last 72 hours. Anemia Panel: No results for input(s): VITAMINB12, FOLATE, FERRITIN, TIBC, IRON, RETICCTPCT in the last 72 hours. Sepsis Labs: Recent Labs  Lab 02/10/19 2018  LATICACIDVEN 1.0    Recent Results (from the past 240 hour(s))  Culture, blood (routine x 2)     Status: Abnormal   Collection Time: 02/09/19  2:29 PM   Specimen: BLOOD LEFT ARM  Result Value Ref Range Status   Specimen Description  Final    BLOOD LEFT ARM Performed at [facility], [address], [place], [place] [address]    Special Requests   Final    BOTTLES DRAWN AEROBIC AND ANAEROBIC Blood Culture adequate volume Performed at [facility], [address] [place], [place] [address]    Culture  Setup Time   Final    GRAM POSITIVE COCCI IN BOTH AEROBIC AND ANAEROBIC BOTTLES CRITICAL VALUE NOTED.  VALUE IS CONSISTENT WITH PREVIOUSLY REPORTED AND CALLED VALUE. Performed at [facility], [address] [place], [place] [address]    Culture (A)  Final    STAPHYLOCOCCUS AUREUS SUSCEPTIBILITIES PERFORMED ON PREVIOUS CULTURE WITHIN THE LAST 5 DAYS. Performed at [facility], [place] [address], [place], [place] [address]    Report Status 02/12/2019 FINAL  Final  Culture, blood (routine x 2)     Status: Abnormal    Collection Time: 02/09/19  2:38 PM   Specimen: BLOOD LEFT ARM  Result Value Ref Range Status   Specimen Description   Final    BLOOD LEFT ARM Performed at [facility], [address], [place], [place] [address]    Special Requests   Final    BOTTLES DRAWN AEROBIC AND ANAEROBIC Blood Culture adequate volume Performed at [facility], [address], [place], [place] [address]    Culture  Setup Time   Final    GRAM POSITIVE COCCI IN BOTH AEROBIC AND ANAEROBIC BOTTLES CRITICAL RESULT CALLED TO, READ BACK BY AND VERIFIED WITH: [place] [id number] 02/10/2019 HNM Performed at [facility], [place] [address], [place],  [address]    Culture METHICILLIN RESISTANT STAPHYLOCOCCUS AUREUS (A)  Final   Report Status 02/12/2019 FINAL  Final   Organism ID, Bacteria METHICILLIN RESISTANT STAPHYLOCOCCUS AUREUS  Final      Susceptibility   Methicillin resistant staphylococcus aureus - MIC*    CIPROFLOXACIN >=8 RESISTANT Resistant     ERYTHROMYCIN >=8 RESISTANT Resistant     GENTAMICIN <=0.5 SENSITIVE Sensitive     OXACILLIN >=4 RESISTANT Resistant     TETRACYCLINE <=1 SENSITIVE Sensitive     VANCOMYCIN 1 SENSITIVE Sensitive     TRIMETH/SULFA <=10 SENSITIVE Sensitive     CLINDAMYCIN RESISTANT Resistant     RIFAMPIN <=0.5 SENSITIVE Sensitive     Inducible Clindamycin POSITIVE Resistant     * METHICILLIN RESISTANT STAPHYLOCOCCUS AUREUS  Blood Culture ID Panel (Reflexed)     Status: Abnormal   Collection Time: 02/09/19  2:38 PM  Result Value Ref Range Status   Enterococcus species NOT DETECTED NOT DETECTED Final   Listeria monocytogenes NOT DETECTED NOT DETECTED Final   Staphylococcus species DETECTED (A) NOT DETECTED Final    Comment: CRITICAL RESULT CALLED TO, READ BACK BY AND VERIFIED WITH: Hart Robinsons PHARMD [id number] 02/10/2019 HNM    Staphylococcus aureus (BCID) DETECTED (A) NOT DETECTED Final    Comment: Methicillin (oxacillin)-resistant Staphylococcus aureus  (MRSA). MRSA is predictably resistant to beta-lactam antibiotics (except ceftaroline). Preferred therapy is vancomycin unless clinically contraindicated. Patient requires contact precautions if  hospitalized. CRITICAL RESULT CALLED TO, READ BACK BY AND VERIFIED WITH: Hart Robinsons PHARMD [id number] 02/10/2019 HNM    Methicillin resistance DETECTED (A) NOT DETECTED Final    Comment: CRITICAL RESULT CALLED TO, READ BACK BY AND VERIFIED WITH: Hart Robinsons PHARMD [id number] 02/10/2019 HNM    Streptococcus species NOT DETECTED NOT DETECTED Final   Streptococcus agalactiae NOT DETECTED NOT DETECTED Final   Streptococcus pneumoniae  NOT DETECTED NOT DETECTED Final   Streptococcus pyogenes NOT DETECTED NOT DETECTED Final   Acinetobacter baumannii NOT DETECTED NOT DETECTED Final   Enterobacteriaceae species NOT DETECTED NOT DETECTED Final   Enterobacter cloacae complex NOT DETECTED NOT DETECTED Final   Escherichia coli NOT DETECTED NOT DETECTED Final   Klebsiella oxytoca NOT DETECTED NOT DETECTED Final   Klebsiella pneumoniae NOT DETECTED NOT DETECTED Final   Proteus species NOT DETECTED NOT DETECTED Final   Serratia marcescens NOT DETECTED NOT DETECTED Final   Haemophilus influenzae NOT DETECTED NOT DETECTED Final   Neisseria meningitidis NOT DETECTED NOT DETECTED Final   Pseudomonas aeruginosa NOT DETECTED NOT DETECTED Final   Candida albicans NOT DETECTED NOT DETECTED Final   Candida glabrata NOT DETECTED NOT DETECTED Final   Candida krusei NOT DETECTED NOT DETECTED Final   Candida parapsilosis NOT DETECTED NOT DETECTED Final   Candida tropicalis NOT DETECTED NOT DETECTED Final    Comment: Performed at [facility], [place], [place] [address]  SARS CORONAVIRUS 2 (TAT 6-24 HRS) Nasopharyngeal Nasopharyngeal Swab     Status: None   Collection Time: 02/09/19  2:54 PM   Specimen: Nasopharyngeal Swab  Result Value Ref Range Status   SARS Coronavirus 2 NEGATIVE NEGATIVE Final    Comment:  (NOTE) SARS-CoV-2 target nucleic acids are NOT DETECTED. The SARS-CoV-2 RNA is generally detectable in upper and lower respiratory specimens during the acute phase of infection. Negative results do not preclude SARS-CoV-2 infection, do not rule out co-infections with other pathogens, and should not be used as the sole basis for treatment or other patient management decisions. Negative results must be combined with clinical observations, patient history, and epidemiological information. The expected result is Negative. Fact Sheet for Patients: SugarRoll.be Fact Sheet for Healthcare Providers: https://www.woods-mathews.com/ This test is not yet approved or cleared by the [place] FDA and  has been authorized for detection and/or diagnosis of SARS-CoV-2 by FDA under an Emergency Use Authorization (EUA). This EUA will remain  in effect (meaning this test can be used) for the duration of the COVID-19 declaration under Section 56 4(b)(1) of the Act, 21 U.S.C. section 360bbb-3(b)(1), unless the authorization is terminated or revoked sooner. Performed at [facility], [place] [address], [place], [place] [address]   MRSA PCR Screening     Status: Abnormal   Collection Time: 02/10/19  5:03 PM   Specimen: Nasopharyngeal  Result Value Ref Range Status   MRSA by PCR POSITIVE (A) NEGATIVE Final    Comment:        The GeneXpert MRSA Assay (FDA approved for NASAL specimens only), is one component of a comprehensive MRSA colonization surveillance program. It is not intended to diagnose MRSA infection nor to guide or monitor treatment for MRSA infections. RESULT CALLED TO, READ BACK BY AND VERIFIED WITH: ALEKSEY FRASER '@[id number]'$  02/10/19 MJU Performed at [facility], [address] [place], [place] [address]   Culture, blood (routine x 2)     Status: Abnormal   Collection Time: 02/11/19 12:31 PM   Specimen: BLOOD  Result Value Ref  Range Status   Specimen Description   Final    BLOOD PORTA CATH Performed at [place] [address] [place], [place] [address]    Special Requests   Final    BOTTLES DRAWN AEROBIC AND ANAEROBIC Blood Culture adequate volume Performed at [facility], [address], [place],  [address]    Culture  Setup  Time   Final    GRAM POSITIVE COCCI ANAEROBIC BOTTLE ONLY CRITICAL RESULT CALLED TO, READ BACK BY AND VERIFIED WITH: Hart Robinsons AT 0277 02/12/2019 [facility]  Performed at [facility], [address] [place], [place] [address]    Culture (A)  Final    STAPHYLOCOCCUS AUREUS SUSCEPTIBILITIES PERFORMED ON PREVIOUS CULTURE WITHIN THE LAST 5 DAYS. Performed at [facility], [place] [address] [place], [place] [address]    Report Status 02/13/2019 FINAL  Final  Culture, blood (routine x 2)     Status: None   Collection Time: 02/11/19  3:57 PM   Specimen: BLOOD  Result Value Ref Range Status   Specimen Description BLOOD PORTA CATH  Final   Special Requests   Final    BOTTLES DRAWN AEROBIC AND ANAEROBIC Blood Culture adequate volume   Culture   Final    NO GROWTH 5 DAYS Performed at [facility], [address] [place], [place] [address]    Report Status 02/16/2019 FINAL  Final  CULTURE, BLOOD (ROUTINE X 2) w Reflex to ID Panel     Status: Abnormal   Collection Time: 02/13/19  2:18 PM   Specimen: BLOOD LEFT HAND  Result Value Ref Range Status   Specimen Description BLOOD LEFT HAND  Final   Special Requests   Final    BOTTLES DRAWN AEROBIC AND ANAEROBIC Blood Culture results may not be optimal due to an inadequate volume of blood received in culture bottles   Culture  Setup Time   Final    AEROBIC BOTTLE ONLY GRAM POSITIVE COCCI IN CLUSTERS CRITICAL RESULT CALLED TO, READ BACK BY AND VERIFIED WITH: Herbert Pun AT 0962 ON 02/14/2019 [place]. Performed at [facility], [place] [address] [place], [place] [address]    Culture  METHICILLIN RESISTANT STAPHYLOCOCCUS AUREUS (A)  Final   Report Status 02/16/2019 FINAL  Final   Organism ID, Bacteria METHICILLIN RESISTANT STAPHYLOCOCCUS AUREUS  Final      Susceptibility   Methicillin resistant staphylococcus aureus - MIC*    CIPROFLOXACIN >=8 RESISTANT Resistant     ERYTHROMYCIN >=8 RESISTANT Resistant     GENTAMICIN <=0.5 SENSITIVE Sensitive     OXACILLIN >=4 RESISTANT Resistant     TETRACYCLINE <=1 SENSITIVE Sensitive     VANCOMYCIN <=0.5 SENSITIVE Sensitive     TRIMETH/SULFA <=10 SENSITIVE Sensitive     CLINDAMYCIN RESISTANT Resistant     RIFAMPIN <=0.5 SENSITIVE Sensitive     Inducible Clindamycin POSITIVE Resistant     * METHICILLIN RESISTANT STAPHYLOCOCCUS AUREUS  Blood Culture ID Panel (Reflexed)     Status: Abnormal   Collection Time: 02/13/19  2:18 PM  Result Value Ref Range Status   Enterococcus species NOT DETECTED NOT DETECTED Final   Listeria monocytogenes NOT DETECTED NOT DETECTED Final   Staphylococcus species DETECTED (A) NOT DETECTED Final    Comment: CRITICAL RESULT CALLED TO, READ BACK BY AND VERIFIED WITH: ABBEY ELLINGTON AT 9476 ON 02/14/2019 [place].    Staphylococcus aureus (BCID) DETECTED (A) NOT DETECTED Final    Comment: Methicillin (oxacillin)-resistant Staphylococcus aureus (MRSA). MRSA is predictably resistant to beta-lactam antibiotics (except ceftaroline). Preferred therapy is vancomycin unless clinically contraindicated. Patient requires contact precautions if  hospitalized. CRITICAL RESULT CALLED TO, READ BACK BY AND VERIFIED WITH: Herbert Pun AT [phone] ON 02/14/2019 [place].    Methicillin resistance DETECTED (A) NOT DETECTED Final    Comment: CRITICAL RESULT CALLED TO, READ BACK BY AND VERIFIED WITH: Herbert Pun AT  [id number] ON 02/14/2019 [place].    Streptococcus species NOT DETECTED NOT DETECTED Final   Streptococcus agalactiae NOT DETECTED NOT DETECTED Final   Streptococcus pneumoniae NOT DETECTED NOT DETECTED Final   Streptococcus  pyogenes NOT DETECTED NOT DETECTED Final   Acinetobacter baumannii NOT DETECTED NOT DETECTED Final   Enterobacteriaceae species NOT DETECTED NOT DETECTED Final   Enterobacter cloacae complex NOT DETECTED NOT DETECTED Final   Escherichia coli NOT DETECTED NOT DETECTED Final   Klebsiella oxytoca NOT DETECTED NOT DETECTED Final   Klebsiella pneumoniae NOT DETECTED NOT DETECTED Final   Proteus species NOT DETECTED NOT DETECTED Final   Serratia marcescens NOT DETECTED NOT DETECTED Final   Haemophilus influenzae NOT DETECTED NOT DETECTED Final   Neisseria meningitidis NOT DETECTED NOT DETECTED Final   Pseudomonas aeruginosa NOT DETECTED NOT DETECTED Final   Candida albicans NOT DETECTED NOT DETECTED Final   Candida glabrata NOT DETECTED NOT DETECTED Final   Candida krusei NOT DETECTED NOT DETECTED Final   Candida parapsilosis NOT DETECTED NOT DETECTED Final   Candida tropicalis NOT DETECTED NOT DETECTED Final    Comment: Performed at [facility], [address] [place], [place] [address]  CULTURE, BLOOD (ROUTINE X 2) w Reflex to ID Panel     Status: None (Preliminary result)   Collection Time: 02/13/19  3:09 PM   Specimen: BLOOD  Result Value Ref Range Status   Specimen Description BLOOD BLOOD LEFT HAND  Final   Special Requests   Final    BOTTLES DRAWN AEROBIC AND ANAEROBIC Blood Culture adequate volume   Culture   Final    NO GROWTH 4 DAYS Performed at [facility], [address] [place], [place] [address]    Report Status PENDING  Incomplete  Culture, blood (routine x 2)     Status: Abnormal (Preliminary result)   Collection Time: 02/15/19 12:49 PM   Specimen: BLOOD  Result Value Ref Range Status   Specimen Description   Final    BLOOD LT HAND Performed at [facility], [address] [place], [place] [address]    Special Requests   Final    BOTTLES DRAWN AEROBIC AND ANAEROBIC Blood Culture results may not be optimal due to an inadequate volume  of blood received in culture bottles Performed at [facility], [address] [place], [place] [address]    Culture  Setup Time   Final    GRAM POSITIVE COCCI IN BOTH AEROBIC AND ANAEROBIC BOTTLES CRITICAL RESULT CALLED TO, READ BACK BY AND VERIFIED WITHEleonore Chiquito AT [id number] 02/16/2019 SDR Performed at [facility], [address] [place], [place] [address]    Culture STAPHYLOCOCCUS AUREUS (A)  Final   Report Status PENDING  Incomplete         Radiology Studies: CT CERVICAL SPINE W CONTRAST  Result Date: 02/17/2019 CLINICAL DATA:  Persistent MRSA bacteremia EXAM: CT CERVICAL SPINE WITH CONTRAST TECHNIQUE: Multidetector CT imaging of the cervical spine was performed during intravenous contrast administration. Multiplanar CT image reconstructions were also generated. CONTRAST:  176mL OMNIPAQUE IOHEXOL 300 MG/ML  SOLN COMPARISON:  2019 MRI FINDINGS: Alignment: Preserved. Skull base and vertebrae: Vertebral body heights are maintained. There is no cortical destruction. Soft tissues and spinal canal: No prevertebral soft tissue swelling. No evidence of an epidural collection within limitation of artifact obscuring the spinal canal. Disc levels: Multilevel degenerative changes are present without high-grade canal or foraminal stenosis. Upper chest: Partially imaged pleural effusions. Other: None. IMPRESSION: No  evidence of discitis/osteomyelitis or collection. Electronically Signed   By: Macy Mis M.D.   On: 02/17/2019 15:57   CT THORACIC SPINE W CONTRAST  Result Date: 02/17/2019 CLINICAL DATA:  MRSA bacteremia EXAM: CT THORACIC SPINE WITH CONTRAST TECHNIQUE: Multidetector CT images of thoracic was performed according to the standard protocol following intravenous contrast administration. CONTRAST:  152mL OMNIPAQUE IOHEXOL 300 MG/ML  SOLN COMPARISON:  None. FINDINGS: Alignment: Preserved. Vertebrae: Vertebral body heights are maintained. There is no cortical  destruction. Paraspinal and other soft tissues: Bilateral pleural effusions and bibasilar atelectasis/consolidation. Partially imaged cardiomegaly. Enlarged main pulmonary artery suggesting pulmonary arterial hypertension. Disc levels: Mild multilevel degenerative changes. There is no high-grade osseous encroachment on the spinal canal or neural foramina. IMPRESSION: No evidence of discitis/osteomyelitis in the thoracic spine. No collection. Bilateral pleural effusions and bibasilar atelectasis/consolidation. Electronically Signed   By: Macy Mis M.D.   On: 02/17/2019 16:17   CT LUMBAR SPINE W CONTRAST  Result Date: 02/17/2019 CLINICAL DATA:  MRSA bacteremia EXAM: CT LUMBAR SPINE WITH CONTRAST TECHNIQUE: Multidetector CT imaging of the lumbar spine was performed with intravenous contrast administration. CONTRAST:  156mL OMNIPAQUE IOHEXOL 300 MG/ML  SOLN COMPARISON:  Via FINDINGS: Segmentation: 5 lumbar type vertebrae. Alignment: Normal. Vertebrae: Vertebral body heights are maintained. There is no cortical destruction. Paraspinal and other soft tissues: Mild presacral edema. No paraspinal collection. Disc levels: There is disc space narrowing and vacuum phenomenon at L5-S1. Multilevel facet hypertrophy is present. Left foraminal stenosis is present at L5-S1. No high-grade canal stenosis with suboptimal evaluation of the spinal canal due to artifact. IMPRESSION: No evidence of discitis/osteomyelitis in the lumbar spine. Nonspecific mild presacral edema. No paraspinal collection. Electronically Signed   By: Macy Mis M.D.   On: 02/17/2019 16:10   [place] RT UPPER EXTREM LTD SOFT TISSUE NON VASCULAR  Result Date: 02/17/2019 CLINICAL DATA:  Evaluate for abscess around AV fistula. MRSA bacteremia. EXAM: ULTRASOUND RIGHT UPPER EXTREMITY LIMITED TECHNIQUE: Ultrasound examination of the upper extremity soft tissues was performed in the area of clinical concern. COMPARISON:  None. FINDINGS: Visualized portions  of the AV fistula are patent. There is a 4.6 x 3.1 x 3.1 cm hypoechoic fluid collection surrounding the distal fistula. There is no internal vascularity or surrounding hyperemia. IMPRESSION: 1. 4.6 x 3.1 x 3.1 cm hypoechoic fluid collection surrounding the distal AV fistula. Appearance is more suggestive of a small hematoma given the lack of surrounding hyperemia, especially if this is a common site of fistula access. Superimposed infection is thought to be unlikely, but cannot be entirely excluded by imaging alone. Electronically Signed   By: Titus Dubin M.D.   On: 02/17/2019 07:41        Scheduled Meds: . aspirin  81 mg Per Tube Daily  . chlorhexidine  15 mL Mouth Rinse BID  . Chlorhexidine Gluconate Cloth  6 each Topical Q0600  . epoetin (EPOGEN/PROCRIT) injection  4,000 Units Intravenous Q M,W,F-HD  . feeding supplement (NEPRO CARB STEADY)  237 mL Oral TID BM  . FLUoxetine  20 mg Per Tube Daily  . fluticasone  2 spray Each Nare Daily  . insulin aspart  0-9 Units Subcutaneous TID WC  . levothyroxine  88 mcg Per Tube QAC breakfast  . mouth rinse  15 mL Mouth Rinse q12n4p  . midodrine  10 mg Per Tube TID WC  . multivitamin  1 tablet Oral QHS  . [START ON 02/18/2019] pantoprazole (PROTONIX) IV  40 mg Intravenous Q24H  . venlafaxine  75 mg Per Tube BID WC  . zinc sulfate  220 mg Per Tube Daily   Continuous Infusions: . sodium chloride 250 mL (02/10/19 0054)  . ceFTAROline (TEFLARO) IV 250 mL/hr at 02/17/19 1200  . [START ON 02/18/2019] DAPTOmycin (CUBICIN)  IV    . heparin 1,200 Units/hr (02/17/19 1200)     LOS: 8 days    Time spent: 81mins    Kathie Dike, MD [facility]   If 7PM-7AM, please contact night-coverage [email or web address]  02/17/2019, 6:48 PM

## 2019-02-17 NOTE — Consult Note (Addendum)
ANTICOAGULATION CONSULT NOTE  Pharmacy Consult for heparin drip  Indication: DVT   Patient Measurements: Height: 5\' 4"  (162.6 cm) Weight: 172 lb 6.4 oz (78.2 kg) IBW/kg (Calculated) : 54.7 Heparin Dosing Weight: 71 kg  Vital Signs: Temp: 98.2 F (36.8 C) (12/15 0000) Temp Source: Oral (12/15 0000) BP: 135/39 (12/15 0200) Pulse Rate: 69 (12/15 0200)  Labs: Recent Labs    02/14/19 2127 02/15/19 0545 02/15/19 0805 02/15/19 1824 02/16/19 0433 02/17/19 0533  HGB  --  6.8*  --  8.8* 8.7*  --   HCT  --  20.0*  --  25.0* 24.7*  --   PLT  --  105*  --   --  126*  --   APTT >160*  --  >160*  --   --   --   HEPARINUNFRC  --  0.30  --   --   --   --   CREATININE  --  4.64*  --   --  5.30* 3.27*  CKTOTAL  --   --   --   --  17*  --     Estimated Creatinine Clearance: 17.4 mL/min (A) (by C-G formula based on SCr of 3.27 mg/dL (H)).   Medical History: Past Medical History:  Diagnosis Date  . Anemia   . Chronic diastolic CHF (congestive heart failure) ([place])    a. Due to ischemic cardiomyopathy. EF as low as 35%, improved to normal s/p CABG; b. echo 07/06/13: EF 55-60%, no RWMAs, mod TR, trivial pericardial effusion not c/w tamponade physiology;  c. 10/2015 Echo: EF 65%, Gr1 DD, triv AI, mild MR, mildly dil LA, mod TR, PASP 83mmHg.  Marland Kitchen Coronary artery disease    a. NSTEMI 06/2013; b.cath: severe three-vessel CAD w/ EF 30% & mild-mod MR; c. s/p 3 vessel CABG 07/02/13 (LIMA-LAD, SVG-OM, and SVG-RPDA);  d. 10/2015 MV: no ischemia/infarct.  . Diabetes mellitus without complication ([place])   . Diabetic neuropathy ([place])   . Dialysis patient [facility])    MWF  . ESRD (end stage renal disease) ([place])    a. 12/2015 initiated - mwf dialysis.  Marland Kitchen GERD (gastroesophageal reflux disease)   . Hyperlipidemia   . Hypertension   . Hypothyroidism   . Myocardial infarction ([place]) 2015  . Neuropathy   . Pleural effusion 2015  . Pulmonary hypertension ([place])   . Wears dentures    full lower    Medications:   Scheduled:  . aspirin  81 mg Per Tube Daily  . atorvastatin  40 mg Per Tube q1800  . chlorhexidine  15 mL Mouth Rinse BID  . Chlorhexidine Gluconate Cloth  6 each Topical Q0600  . epoetin (EPOGEN/PROCRIT) injection  4,000 Units Intravenous Q M,W,F-HD  . FLUoxetine  20 mg Per Tube Daily  . fluticasone  2 spray Each Nare Daily  . heparin injection (subcutaneous)  5,000 Units Subcutaneous Q8H  . insulin aspart  0-9 Units Subcutaneous TID WC  . levothyroxine  88 mcg Per Tube QAC breakfast  . mouth rinse  15 mL Mouth Rinse q12n4p  . midodrine  10 mg Per Tube TID WC  . multivitamin  15 mL Per Tube Daily  . pantoprazole (PROTONIX) IV  40 mg Intravenous Q24H  . phosphorus  500 mg Per Tube Once  . sodium chloride flush  3 mL Intravenous Once  . venlafaxine  75 mg Per Tube BID WC  . zinc sulfate  220 mg Per Tube Daily    Assessment: 63 y.o. female admitted  on 02/09/2019 with MRSA  bacteremia. She was on apixaban PTA but last dose was prior to admission (12/7). US reveals age-indeterminate short segment occlusive DVT involving the mid humeral aspect of one of the paired brachial veins.  Heparin was recently held by MD on 12/12 after nurse noticed oozing during lab draw.  Heparin is now restarted, as TEE is tentative.  Current hemoglobin is 8.7 and platelets are 126 -- both increasing.  Goal of Therapy:  Heparin level 0.3-0.7 units/ml aPTT 66 - 102s Monitor platelets by anticoagulation protocol: Yes   Plan:  Give 3600 unit bolus Start 1200 units/hour continuous infusion Follow 8-hour heparin level and aPTT on 12/15 @ 2000. Follow CBC with AM labs.  ---------- ADDENDUM  12/15 2:53, nurse informed me that patient would be going to CT, and heparin would be off for about 15 min.  Heparin was restarted.   Gerald Dexter, PharmD 02/17/2019,10:59 AM

## 2019-02-17 NOTE — Progress Notes (Signed)
PT Cancellation Note  Patient Details Name: Jamie Arias MRN: [id number] DOB: Feb 09, 1956   Cancelled Treatment:    Reason Eval/Treat Not Completed: Medical issues which prohibited therapy(Consult received and chart reviewed. Patient noted with pending imaging for cervical, thoracic and lumbar spine to rule out spinal pathology.  Will hold evaluation pending test completion and results.  Will continue to follow and initiate as appropriate.)   Lakeita Panther H. Owens Shark, PT, DPT, NCS 02/17/19, 2:43 PM [phone]

## 2019-02-18 ENCOUNTER — Inpatient Hospital Stay: Payer: Medicare Other

## 2019-02-18 LAB — GLUCOSE, CAPILLARY
Glucose-Capillary: 122 mg/dL — ABNORMAL HIGH (ref 70–99)
Glucose-Capillary: 163 mg/dL — ABNORMAL HIGH (ref 70–99)
Glucose-Capillary: 82 mg/dL (ref 70–99)
Glucose-Capillary: 103 mg/dL — ABNORMAL HIGH (ref 70–99)

## 2019-02-18 LAB — MAGNESIUM: Magnesium: 1.7 mg/dL (ref 1.7–2.4)

## 2019-02-18 LAB — CULTURE, BLOOD (ROUTINE X 2)
Culture: NO GROWTH
Special Requests: ADEQUATE

## 2019-02-18 LAB — BASIC METABOLIC PANEL
Anion gap: 14 (ref 5–15)
BUN: 18 mg/dL (ref 8–23)
CO2: 23 mmol/L (ref 22–32)
Calcium: 8 mg/dL — ABNORMAL LOW (ref 8.9–10.3)
Chloride: 100 mmol/L (ref 98–111)
Creatinine, Ser: 3.95 mg/dL — ABNORMAL HIGH (ref 0.44–1.00)
GFR calc Af Amer: 13 mL/min — ABNORMAL LOW (ref >60)
GFR calc non Af Amer: 11 mL/min — ABNORMAL LOW (ref >60)
Glucose, Bld: 128 mg/dL — ABNORMAL HIGH (ref 70–99)
Potassium: 4 mmol/L (ref 3.5–5.1)
Sodium: 137 mmol/L (ref 135–145)

## 2019-02-18 LAB — CBC
HCT: 28.4 % — ABNORMAL LOW (ref 36.0–46.0)
Hemoglobin: 9.4 g/dL — ABNORMAL LOW (ref 12.0–15.0)
MCH: 27.9 pg (ref 26.0–34.0)
MCHC: 33.1 g/dL (ref 30.0–36.0)
MCV: 84.3 fL (ref 80.0–100.0)
Platelets: 177 10*3/uL (ref 150–400)
RBC: 3.37 MIL/uL — ABNORMAL LOW (ref 3.87–5.11)
RDW: 19 % — ABNORMAL HIGH (ref 11.5–15.5)
WBC: 10.6 10*3/uL — ABNORMAL HIGH (ref 4.0–10.5)
nRBC: 0 % (ref 0.0–0.2)

## 2019-02-18 LAB — AMMONIA: Ammonia: 10 umol/L (ref 9–35)

## 2019-02-18 LAB — HEPARIN LEVEL (UNFRACTIONATED): Heparin Unfractionated: 0.29 IU/mL — ABNORMAL LOW (ref 0.30–0.70)

## 2019-02-18 LAB — PHOSPHORUS
Phosphorus: 2.7 mg/dL (ref 2.5–4.6)
Phosphorus: 3 mg/dL (ref 2.5–4.6)

## 2019-02-18 LAB — APTT: aPTT: 109 seconds — ABNORMAL HIGH (ref 24–36)

## 2019-02-18 MED ORDER — MAGNESIUM SULFATE 2 GM/50ML IV SOLN
2.0000 g | Freq: Once | INTRAVENOUS | Status: AC
  Administered 2019-02-18: 2 g via INTRAVENOUS
  Filled 2019-02-18: qty 50

## 2019-02-18 NOTE — Plan of Care (Signed)
PMT note: Patient remains in dialysis. Husband returned call. He will come tomorrow at 12:00 for a meeting.

## 2019-02-18 NOTE — Consult Note (Signed)
ANTICOAGULATION CONSULT NOTE  Pharmacy Consult for heparin drip  Indication: DVT   Patient Measurements: Height: 5\' 4"  (162.6 cm) Weight: 165 lb 2 oz (74.9 kg) IBW/kg (Calculated) : 54.7 Heparin Dosing Weight: 71 kg  Vital Signs: Temp: 98.8 F (37.1 C) (12/16 1445) Temp Source: Oral (12/16 1445) BP: 119/56 (12/16 1445) Pulse Rate: 71 (12/16 1445)  Labs: Recent Labs    02/15/19 1824 02/16/19 0433 02/17/19 0533 02/17/19 2011 02/18/19 0215  HGB 8.8* 8.7*  --   --  9.4*  HCT 25.0* 24.7*  --   --  28.4*  PLT  --  126*  --   --  177  APTT  --   --   --  >160* 109*  LABPROT  --   --   --  15.6*  --   INR  --   --   --  1.3*  --   HEPARINUNFRC  --   --   --  0.68 0.29*  CREATININE  --  5.30* 3.27*  --  3.95*  CKTOTAL  --  17*  --   --   --     Estimated Creatinine Clearance: 14.1 mL/min (A) (by C-G formula based on SCr of 3.95 mg/dL (H)).   Medical History: Past Medical History:  Diagnosis Date  . Anemia   . Chronic diastolic CHF (congestive heart failure) ([place])    a. Due to ischemic cardiomyopathy. EF as low as 35%, improved to normal s/p CABG; b. echo 07/06/13: EF 55-60%, no RWMAs, mod TR, trivial pericardial effusion not c/w tamponade physiology;  c. 10/2015 Echo: EF 65%, Gr1 DD, triv AI, mild MR, mildly dil LA, mod TR, PASP 70mmHg.  Marland Kitchen Coronary artery disease    a. NSTEMI 06/2013; b.cath: severe three-vessel CAD w/ EF 30% & mild-mod MR; c. s/p 3 vessel CABG 07/02/13 (LIMA-LAD, SVG-OM, and SVG-RPDA);  d. 10/2015 MV: no ischemia/infarct.  . Diabetes mellitus without complication ([place])   . Diabetic neuropathy ([place])   . Dialysis patient [facility])    MWF  . ESRD (end stage renal disease) ([place])    a. 12/2015 initiated - mwf dialysis.  Marland Kitchen GERD (gastroesophageal reflux disease)   . Hyperlipidemia   . Hypertension   . Hypothyroidism   . Myocardial infarction ([place]) 2015  . Neuropathy   . Pleural effusion 2015  . Pulmonary hypertension ([place])   . Renal insufficiency   . Wears  dentures    full lower    Medications:  Scheduled:  . aspirin  81 mg Per Tube Daily  . chlorhexidine  15 mL Mouth Rinse BID  . Chlorhexidine Gluconate Cloth  6 each Topical Q0600  . epoetin (EPOGEN/PROCRIT) injection  4,000 Units Intravenous Q M,W,F-HD  . feeding supplement (NEPRO CARB STEADY)  237 mL Oral TID BM  . FLUoxetine  20 mg Per Tube Daily  . fluticasone  2 spray Each Nare Daily  . insulin aspart  0-9 Units Subcutaneous TID WC  . levothyroxine  88 mcg Per Tube QAC breakfast  . mouth rinse  15 mL Mouth Rinse q12n4p  . midodrine  10 mg Per Tube TID WC  . multivitamin  1 tablet Oral QHS  . pantoprazole (PROTONIX) IV  40 mg Intravenous Q24H  . venlafaxine  75 mg Per Tube BID WC  . zinc sulfate  220 mg Per Tube Daily    Assessment: 63 y.o. female admitted on 02/09/2019 with MRSA  bacteremia. She was on apixaban PTA but last dose was prior  to admission (12/7). US reveals age-indeterminate short segment occlusive DVT involving the mid humeral aspect of one of the paired brachial veins.  Heparin was recently held by MD on 12/12 after nurse noticed oozing during lab draw.  Heparin is now restarted, as TEE is tentative.  Current hemoglobin is 8.7 and platelets are 126 -- both increasing.  12/16 @1824 : Patient was in dialysis today. Per nurse, Margarita Grizzle, heparin was stopped for ~ 3-5 hours (0952-1500 heparin stop orders placed) while in dialysis. Heparin was resumed around 1500. Therefore, will recheck aPTT levels based on when heparin was resumed.   Goal of Therapy:  Heparin level 0.3-0.7 units/ml aPTT 66 - 102s Monitor platelets by anticoagulation protocol: Yes   Plan:  1. Continue current heparin rate of 1100 units/hr and will recheck aPTT in 8 hours. Will check HL and CBC with AM labs.   Rowland Lathe, PharmD 02/18/2019,6:23 PM

## 2019-02-18 NOTE — Progress Notes (Signed)
Post HD Tx Note  BP was low half way thru Tx. UG goal was reduced to 1L instead of 1.5L. 259ml of NS was given. BP restored after. Pt continued to receive Heparin drip initiated on her primary floor. Pt continued to be A*2 self and place not time and situation. She reports no chest pain or SOB. AVF post Tx [facility]   02/18/19 1445  Hand-Off documentation  Report given to (Full Name) Patricia Pesa RN   Report received from (Full Name) Newt Minion RN   Vital Signs  Temp 98.8 F (37.1 C)  Temp Source Oral  Pulse Rate 71  Pulse Rate Source Monitor  Resp 11  BP (!) 119/56  BP Location Right Leg  BP Method Automatic  Patient Position (if appropriate) Lying  Oxygen Therapy  SpO2 95 %  O2 Device Room Air  Pain Assessment  Pain Scale 0-10  Pain Score 0  Post-Hemodialysis Assessment  Rinseback Volume (mL) 250 mL  KECN 61.4 V  Dialyzer Clearance Lightly streaked  Duration of HD Treatment -hour(s) 3.5 hour(s)  Hemodialysis Intake (mL) 700 mL  UF Total -Machine (mL) 1240 mL  Net UF (mL) 540 mL  Tolerated HD Treatment Yes  AVG/AVF Arterial Site Held (minutes) 8 minutes  AVG/AVF Venous Site Held (minutes) 8 minutes  Fistula / Graft Right Upper arm Arteriovenous vein graft  Placement Date/Time: 02/03/16 1210   Placed prior to admission: No  Orientation: Right  Access Location: Upper arm  Access Type: Arteriovenous vein graft  Site Condition No complications  Fistula / Graft Assessment Present;Thrill;Bruit  Status Deaccessed  Needle Size 15  Drainage Description None

## 2019-02-18 NOTE — Plan of Care (Signed)

## 2019-02-18 NOTE — Plan of Care (Signed)
PMT note: Patient is currently in dialysis and per staff and notes is confused.  Called to speak with husband [name] unsuccessfully. VM left.

## 2019-02-18 NOTE — Evaluation (Signed)
Physical Therapy Evaluation Patient Details Name: Jamie Arias MRN: [id number] DOB: February 23, 1956 Today's Date: 02/18/2019   History of Present Illness  Pt admitted for sepsis secondary to MRSA with complaints of abdominal pain and AMS. PMH includes ESRD on HD MWF, anemia, CHF, CAD s/p PCI, DM, and HTN. Pt contracted covid in July 2020 and has been dealing with chronic weakness. Hospital stay complicated by rapid response and transfer to CCU on 12/8 secondary to hypotension. Pt is poor historian and most of history obtained via medical record and documented conversations with pt husband.  Clinical Impression  Pt is a 63 year old confused female who was admitted for sepsis. Pt limited by cognition and poor participation. Chronic weakness s/p Covid illness in July 202. Per chart, pt is now bedbound, uses lift/WC at home and has 24/7 care provided by spouse. Attempted there-ex and repositioning in bed, however pt is total assist and unable to participate. At this time, pt is not appropriate for PT intervention as she is at her baseline. Pt will be dc in house and does not require follow up. RN aware. Will dc current orders.     Follow Up Recommendations No PT follow up    Equipment Recommendations  Hospital bed    Recommendations for Other Services       Precautions / Restrictions Precautions Precautions: Fall Restrictions Weight Bearing Restrictions: No      Mobility  Bed Mobility Overal bed mobility: Needs Assistance             General bed mobility comments: noted pt with R lateral leaning while sitting up in bed. Pt unable to self adjust, total assist for repositioning. Unable to follow commands for further bed mobility  Transfers                 General transfer comment: not able to participate  Ambulation/Gait             General Gait Details: bedbound at baseline, use of lift  Stairs            Wheelchair Mobility    Modified Rankin (Stroke  Patients Only)       Balance                                             Pertinent Vitals/Pain Pain Assessment: (unable to rate)    Home Living Family/patient expects to be discharged to:: Private residence Living Arrangements: Spouse/significant other Available Help at Discharge: Family;Available 24 hours/day;Other (Comment) Type of Home: Mobile home Home Access: Stairs to enter Entrance Stairs-Rails: Right Entrance Stairs-Number of Steps: 8 Home Layout: One level Home Equipment: Walker - 2 wheels;Wheelchair - manual;Bedside commode(lift)      Prior Function Level of Independence: Needs assistance         Comments: Pt is poor historian, although alert. HIstory obtained through chart review. Per notes, pt is bedbound. Husband uses lift to transfer to [facility].     Hand Dominance        Extremity/Trunk Assessment   Upper Extremity Assessment Upper Extremity Assessment: Generalized weakness(B UE grossly 2/5; able to grip squeeze)    Lower Extremity Assessment Lower Extremity Assessment: Generalized weakness(B LE grossly 1+/5; edema noted)       Communication   Communication: No difficulties  Cognition Arousal/Alertness: Awake/alert Behavior During Therapy: WFL for tasks assessed/performed Overall Cognitive Status:  No family/caregiver present to determine baseline cognitive functioning                                 General Comments: pt alert and able to name objects, however has blank stare and isn't able to answer questions of follow complex commands      General Comments      Exercises Other Exercises Other Exercises: asked pt to participate in ther-ex including AP and SLRs. Total assist for demonstration of ther-ex, pt unable to perform or demonstrate attempt.  Other Exercises: Pt with breakfast tray sitting in front of her. When therapist asked how she feeds herself, she says fork. However when asked to take a bite, she  stares off into space. Unable to self feed on command   Assessment/Plan    PT Assessment Patent does not need any further PT services  PT Problem List         PT Treatment Interventions      PT Goals (Current goals can be found in the Care Plan section)  Acute Rehab PT Goals Patient Stated Goal: unable to state PT Goal Formulation: Patient unable to participate in goal setting Time For Goal Achievement: 02/18/19 Potential to Achieve Goals: Poor    Frequency     Barriers to discharge        Co-evaluation               AM-PAC PT "6 Clicks" Mobility  Outcome Measure Help needed turning from your back to your side while in a flat bed without using bedrails?: Total Help needed moving from lying on your back to sitting on the side of a flat bed without using bedrails?: Total Help needed moving to and from a bed to a chair (including a wheelchair)?: Total Help needed standing up from a chair using your arms (e.g., wheelchair or bedside chair)?: Total Help needed to walk in hospital room?: Total Help needed climbing 3-5 steps with a railing? : Total 6 Click Score: 6    End of Session   Activity Tolerance: (limited by cognition and weakness) Patient left: in bed;with bed alarm set Nurse Communication: Mobility status PT Visit Diagnosis: Difficulty in walking, not elsewhere classified (R26.2)    Time: [id number] PT Time Calculation (min) (ACUTE ONLY): 10 min   Charges:   PT Evaluation $PT Eval Moderate Complexity: [address], PT, DPT [phone]   Bryceton Hantz 02/18/2019, 9:52 AM

## 2019-02-18 NOTE — Progress Notes (Signed)
    Rounded on patient again this morning. She continues to be very lethargic and will not respond to any questions. She remains unable to proceed with TEE or consent for procedure at this time. Please reconsult cardiology when patient is able to proceed.

## 2019-02-18 NOTE — Progress Notes (Signed)
HD Tx Completed    02/18/19 1430  Vital Signs  Pulse Rate 71  Resp 11  BP (!) 110/54  Oxygen Therapy  SpO2 95 %  O2 Device Room Air  Pain Assessment  Pain Scale 0-10  Pain Score 0  During Hemodialysis Assessment  Blood Flow Rate (mL/min) 300 mL/min  Arterial Pressure (mmHg) -140 mmHg  Venous Pressure (mmHg) 140 mmHg  Transmembrane Pressure (mmHg) 40 mmHg  Ultrafiltration Rate (mL/min) 380 mL/min  Dialysate Flow Rate (mL/min) 800 ml/min  Conductivity: Machine  13.9  HD Safety Checks Performed Yes  Intra-Hemodialysis Comments Progressing as prescribed

## 2019-02-18 NOTE — Progress Notes (Signed)
HD Tx Started    02/18/19 1100  Vital Signs  Pulse Rate 68  Resp 12  BP (!) 141/76  BP Location Right Leg  BP Method Automatic  Patient Position (if appropriate) Lying  Oxygen Therapy  SpO2 95 %  O2 Device Room Air  Pain Assessment  Pain Scale 0-10  Pain Score 0  During Hemodialysis Assessment  Blood Flow Rate (mL/min) 350 mL/min  Arterial Pressure (mmHg) -150 mmHg  Venous Pressure (mmHg) 120 mmHg  Transmembrane Pressure (mmHg) 40 mmHg  Ultrafiltration Rate (mL/min) 660 mL/min  Dialysate Flow Rate (mL/min) 800 ml/min  Conductivity: Machine  14  HD Safety Checks Performed Yes  Dialysis Fluid Bolus Normal Saline  Bolus Amount (mL) 250 mL  Intra-Hemodialysis Comments Tx initiated

## 2019-02-18 NOTE — Progress Notes (Signed)
Pre HD Note    02/18/19 1050  Hand-Off documentation  Report given to (Full Name) Newt Minion RN   Report received from (Full Name) Patricia Pesa RN   Vital Signs  Temp 98.1 F (36.7 C)  Temp Source Oral  Pulse Rate (!) 59  Pulse Rate Source Monitor  Resp 12  BP (!) 141/68  BP Location Right Leg  BP Method Automatic  Patient Position (if appropriate) Lying  Oxygen Therapy  SpO2 95 %  O2 Device Room Air  Pain Assessment  Pain Scale 0-10  Pain Score 0  Dialysis Weight  Weight 74.9 kg  Type of Weight Pre-Dialysis  Time-Out for Hemodialysis  What Procedure? HD  Pt Identifiers(min of two) MRN/Account#;First/Last Name  Correct Site? Yes  Correct Side? Yes  Correct Procedure? Yes  Consents Verified? Yes  Safety Precautions Reviewed? Yes  Engineer, civil (consulting) Number 1  Station Number 1  UF/Alarm Test Passed  Conductivity: Meter 13.4  Conductivity: Machine  13.6  pH 7.6  Reverse Osmosis Main  Normal Saline Lot Number [id number]  Dialyzer Lot Number [id number]  Disposable Set Lot Number [id number]  Machine Temperature 19 F (36.1 C)  Musician and Audible Yes  Blood Lines Intact and Secured Yes  Pre Treatment Patient Checks  Vascular access used during treatment Fistula  HD catheter dressing before treatment WDL  Patient is receiving dialysis in a chair  (in bed)  Hepatitis B Surface Antigen Results Negative  Date Hepatitis B Surface Antigen Drawn 02/10/19  Hepatitis B Surface Antibody  (<10 Unknown )  Date Hepatitis B Surface Antibody Drawn 02/10/19  Hemodialysis Consent Verified Yes  Hemodialysis Standing Orders Initiated Yes  ECG (Telemetry) Monitor On Yes  Prime Ordered Normal Saline  Length of  DialysisTreatment -hour(s) 3.5 Hour(s)  Dialysis Treatment Comments  (Na140)  Dialyzer Elisio 17H NR  Dialysate 2K;2.5 Ca  Dialysis Anticoagulant None  Dialysate Flow Ordered 800  Blood Flow Rate Ordered 400 mL/min  Ultrafiltration Goal 1.5 Liters  Pre  Treatment Labs Phosphorus  Dialysis Blood Pressure Support Ordered Albumin  Education / Care Plan  Dialysis Education Provided Yes  Documented Education in Care Plan Yes  Fistula / Graft Right Upper arm Arteriovenous vein graft  Placement Date/Time: 02/03/16 1210   Placed prior to admission: No  Orientation: Right  Access Location: Upper arm  Access Type: Arteriovenous vein graft  Site Condition No complications  Fistula / Graft Assessment Present;Thrill;Bruit  Status Accessed  Needle Size 15  Drainage Description None

## 2019-02-18 NOTE — Progress Notes (Signed)
Pharmacy Electrolyte Monitoring Consult:  Pharmacy consulted to assist in monitoring and replacing electrolytes in this 63 y.o. female admitted on 02/09/2019. Patient with ESRD; last dialysis 12/14. Patient with MRSA bacteremia awaiting TEE. She has a h/o CAD and is s/p CABG  Labs:  Sodium (mmol/L)  Date Value  02/18/2019 137  03/30/2014 140   Potassium (mmol/L)  Date Value  02/18/2019 4.0  03/30/2014 4.3   Magnesium (mg/dL)  Date Value  02/18/2019 1.7  07/19/2013 1.7 (L)   Phosphorus (mg/dL)  Date Value  02/18/2019 2.7   Calcium (mg/dL)  Date Value  02/18/2019 8.0 (L)   Calcium, Total (mg/dL)  Date Value  03/30/2014 9.0   Albumin (g/dL)  Date Value  02/17/2019 2.0 (L)  02/11/2018 4.2  07/23/2013 2.7 (L)   Corrected Ca: 9.6 mg/dL  Goals of Therapy Given Cardiac History: Potassium 4.0 - 5.1 mmol/L Magnesium 2.0 - 2.4 mg/dL All Other Electrolytes WNL  Plan:  2 grams IV magnesium sulfate x 1  Renal function panel, magnesium level in am  Pharmacy will continue to monitor and adjust per consult.   Dallie Piles 02/18/2019 7:13 AM

## 2019-02-18 NOTE — Consult Note (Signed)
PODIATRY / FOOT AND ANKLE SURGERY CONSULTATION NOTE  Requesting Physician: Dr. Priscella Mann  Reason for consult: L heel ulceration/eschar  Chief Complaint: L heel pain/sore   HPI: Jamie Arias is a 62 y.o. female who presented due to encephalopathic changes and septic shock with MRSA bacteremia.  Patient has had numerous studies looking for source of infection and podiatry was consulted for evaluation of the left plantar heel wound.  Discussed with patient's family at bedside as the patient is nonverbal at this point the case at hand.  They state that this heel sore started a few weeks ago at a extended care facility.  They have not really noticed any drainage coming from the wound and have not seen any signs of infection really overall.  She has been wearing pillow boot since and has been having dressing changes since her admission to the hospital.  Patient has a long past medical history that is seen below.  Patient has a history of COVID-19 but tested negative 9 days ago on admission  PMHx:  Past Medical History:  Diagnosis Date  . Anemia   . Chronic diastolic CHF (congestive heart failure) ([place])    a. Due to ischemic cardiomyopathy. EF as low as 35%, improved to normal s/p CABG; b. echo 07/06/13: EF 55-60%, no RWMAs, mod TR, trivial pericardial effusion not c/w tamponade physiology;  c. 10/2015 Echo: EF 65%, Gr1 DD, triv AI, mild MR, mildly dil LA, mod TR, PASP 26mHg.  .Marland KitchenCoronary artery disease    a. NSTEMI 06/2013; b.cath: severe three-vessel CAD w/ EF 30% & mild-mod MR; c. s/p 3 vessel CABG 07/02/13 (LIMA-LAD, SVG-OM, and SVG-RPDA);  d. 10/2015 MV: no ischemia/infarct.  . Diabetes mellitus without complication ([place]   . Diabetic neuropathy ([place]   . Dialysis patient ([facility]    MWF  . ESRD (end stage renal disease) ([place]    a. 12/2015 initiated - mwf dialysis.  .Marland KitchenGERD (gastroesophageal reflux disease)   . Hyperlipidemia   . Hypertension   . Hypothyroidism   . Myocardial infarction ([place] 2015    . Neuropathy   . Pleural effusion 2015  . Pulmonary hypertension ([place]   . Renal insufficiency   . Wears dentures    full lower    Surgical Hx:  Past Surgical History:  Procedure Laterality Date  . A/V FISTULAGRAM Right 12/17/2016   Procedure: A/V Fistulagram;  Surgeon: DAlgernon Huxley MD;  [place] [place] [place];  Service: Cardiovascular;  Laterality: Right;  . A/V FISTULAGRAM Right 01/07/2017   Procedure: A/V Fistulagram;  Surgeon: DAlgernon Huxley MD;  [place];  Service: Cardiovascular;  Laterality: Right;  . A/V FISTULAGRAM Right 12/03/2017   Procedure: A/V FISTULAGRAM;  Surgeon: SKatha Cabal MD;  [place] [facility];  Service: Cardiovascular;  Laterality: Right;  . A/V SHUNT INTERVENTION N/A 02/22/2017   Procedure: A/V SHUNT INTERVENTION;  Surgeon: DAlgernon Huxley MD;  [place];  Service: Cardiovascular;  Laterality: N/A;  . AV FISTULA PLACEMENT Right 02/03/2016   Procedure: INSERTION OF ARTERIOVENOUS (AV) GORE-TEX GRAFT ARM ( BRACH / AXILLARY );  Surgeon: GKatha Cabal MD;  Location: [facility];  Service: Vascular;  Laterality: Right;  . CARDIAC CATHETERIZATION    . CATARACT EXTRACTION Bilateral   . CHOLECYSTECTOMY N/A 12/09/2014   Procedure: LAPAROSCOPIC CHOLECYSTECTOMY;  Surgeon: CMarlyce [name] MD;  Location: [facility];  Service: General;  Laterality: N/A;  . CORONARY ARTERY BYPASS GRAFT N/A 07/02/2013  Procedure: CORONARY ARTERY BYPASS GRAFTING (CABG);  Surgeon: Ivin [name], MD;  Location: [place];  Service: Open Heart Surgery;  Laterality: N/A;  CABG x three, using left internal mammary artery and right leg greater saphenous vein harvested endoscopically  . ESOPHAGOGASTRODUODENOSCOPY (EGD) WITH PROPOFOL N/A 11/24/2015   Procedure: ESOPHAGOGASTRODUODENOSCOPY (EGD) WITH PROPOFOL;  Surgeon: [name], MD;  Location: [place];  Service: Endoscopy;  Laterality: N/A;  Diabetic - insulin  . EYE  SURGERY Bilateral    Cataract Extraction with IOL  . INTRAOPERATIVE TRANSESOPHAGEAL ECHOCARDIOGRAM N/A 07/02/2013   Procedure: INTRAOPERATIVE TRANSESOPHAGEAL ECHOCARDIOGRAM;  Surgeon: Ivin [name], MD;  Location: [place];  Service: Open Heart Surgery;  Laterality: N/A;  . PERIPHERAL VASCULAR CATHETERIZATION Right 12/06/2015   Procedure: Dialysis/Perma Catheter Insertion;  Surgeon: Katha Cabal, MD;  Location: [place] [facility];  Service: Cardiovascular;  Laterality: Right;  . PERIPHERAL VASCULAR THROMBECTOMY Right 09/28/2016   Procedure: Peripheral Vascular Thrombectomy;  Surgeon: Katha Cabal, MD;  Location: [place] [facility];  Service: Cardiovascular;  Laterality: Right;  . PERIPHERAL VASCULAR THROMBECTOMY Right 05/20/2018   Procedure: PERIPHERAL VASCULAR THROMBECTOMY;  Surgeon: Katha Cabal, MD;  Location: [place] [facility];  Service: Cardiovascular;  Laterality: Right;  . PORTA CATH REMOVAL N/A 06/01/2016   Procedure: Glori [name] Cath Removal;  Surgeon: Katha Cabal, MD;  Location: [place] [facility];  Service: Cardiovascular;  Laterality: N/A;  . THORACENTESIS Left 2015    FHx:  Family History  Problem Relation Age of Onset  . COPD Mother   . Cancer Mother        Lung  . Pulmonary embolism Father   . Diabetes Father   . Diabetes Paternal Grandfather   . Heart disease Maternal Grandmother   . Colon cancer Neg Hx   . Colon polyps Neg Hx   . Esophageal cancer Neg Hx   . Pancreatic cancer Neg Hx   . Liver disease Neg Hx     Social History:  reports that she has never smoked. She has never used smokeless tobacco. She reports that she does not drink alcohol or use drugs.  Allergies:  Allergies  Allergen Reactions  . Nsaids Other (See Comments)    Contraindicated due to kidney disease.  Marland Kitchen Doxycycline Other (See Comments)    tremor    Review of Systems: Unable to obtain due to patient's mentation  Medications Prior to Admission  Medication Sig  Dispense Refill  . ACCU-CHEK FASTCLIX LANCETS MISC Check blood glucose twice daily, diagnosis code E11.9 204 each 3  . acetaminophen (TYLENOL) 325 MG tablet Take 650 mg by mouth daily as needed for moderate pain or headache.     . Alirocumab (PRALUENT) 75 MG/ML SOAJ Inject 75 mg into the skin every 14 (fourteen) days. 2 pen 11  . Amino Acids-Protein Hydrolys (FEEDING SUPPLEMENT, PRO-STAT SUGAR FREE 64,) LIQD Take 30 mLs by mouth 3 (three) times daily between meals. 887 mL 0  . amiodarone (PACERONE) 200 MG tablet Take 200 mg by mouth daily.    Marland Kitchen aspirin EC 81 MG tablet Take 81 mg by mouth daily.    Marland Kitchen atorvastatin (LIPITOR) 40 MG tablet Take 40 mg by mouth daily.    . Blood Glucose Monitoring Suppl (ACCU-CHEK AVIVA PLUS) w/Device KIT Use as directed to test blood sugar up to three times daily 1 kit 0  . Calcium Acetate 667 MG TABS Take 2,001 mg by mouth 3 (three) times daily with meals.    Marland Kitchen  ELIQUIS 5 MG TABS tablet Take 5 mg by mouth 2 (two) times daily.     . ferrous sulfate 325 (65 FE) MG tablet Take 325 mg by mouth daily with breakfast.    . FLUoxetine (PROZAC) 20 MG capsule Take 20 mg by mouth daily.    . fluticasone (FLONASE) 50 MCG/ACT nasal spray Place 2 sprays into both nostrils daily. (Patient taking differently: Place 2 sprays into both nostrils daily as needed for allergies or rhinitis. ) 16 g 6  . furosemide (LASIX) 20 MG tablet Take 40 mg by mouth every other day. Non-dialysis days     . gabapentin (NEURONTIN) 300 MG capsule Take 300 mg by mouth 2 (two) times daily.     Marland Kitchen glucose blood (ACCU-CHEK AVIVA) test strip Use as instructed to test blood sugar up to 3 times daily 100 each 12  . Insulin Glargine (BASAGLAR KWIKPEN) 100 UNIT/ML SOPN Inject 18 Units into the skin daily.    . Lancets (ACCU-CHEK SOFT TOUCH) lancets Use as instructed 100 each 12  . levothyroxine (SYNTHROID) 88 MCG tablet Take 88 mcg by mouth daily before breakfast.    . lidocaine-prilocaine (EMLA) cream Apply 1  application topically as needed (port access).    . midodrine (PROAMATINE) 10 MG tablet Take 1 tablet (10 mg total) by mouth 3 (three) times daily with meals. (Patient taking differently: Take 10 mg by mouth 3 (three) times daily as needed (low BP). )    . multivitamin (RENA-VIT) TABS tablet Take 1 tablet by mouth at bedtime.  0  . nitroGLYCERIN (NITROSTAT) 0.4 MG SL tablet DISSOLVE ONE TABLET UNDER THE TONGUE EVERY 5 MINUTES AS NEEDED FOR CHEST PAIN.  DO NOT EXCEED A TOTAL OF 3 DOSES IN 15 MINUTES (Patient taking differently: Place 0.4 mg under the tongue every 5 (five) minutes as needed for chest pain. DISSOLVE ONE TABLET UNDER THE TONGUE EVERY 5 MINUTES AS NEEDED FOR CHEST PAIN.  DO NOT EXCEED A TOTAL OF 3 DOSES IN 15 MINUTES) 25 tablet 0  . ondansetron (ZOFRAN-ODT) 4 MG disintegrating tablet Take 1 tablet (4 mg total) by mouth every 8 (eight) hours as needed for nausea or vomiting. appt with PCP further refills Dr. Caryl Bis 30 tablet 0  . pantoprazole (PROTONIX) 40 MG tablet Take 40 mg by mouth daily.    . vitamin C (VITAMIN C) 500 MG tablet Take 1 tablet (500 mg total) by mouth daily.    Marland Kitchen zinc sulfate 220 (50 Zn) MG capsule Take 1 capsule (220 mg total) by mouth daily.    . mirtazapine (REMERON) 15 MG tablet Take 1 tablet (15 mg total) by mouth at bedtime. (Patient not taking: Reported on 02/09/2019) 30 tablet 1    Physical Exam: General:  No apparent distress.  Appears disoriented and nonverbal.  Vascular: DP/PT pulses palpable bilateral.  Capillary fill time intact to digits of the foot bilateral.  No hair growth to digits noted.  Neuro: Light touch sensation appears to be fairly intact to the digits although difficult to appreciate due to patient's mentation  Derm: 3 x 3 stable eschar present to the posterior aspect of the left heel, pressure ulceration, no drainage present, minimal periwound erythema present, no edema present.  After debridement of this area it appears to go only to the  depth of the subcutaneous tissue and has no underlying drainage concerning for worsening infection.  The wound bed after debridement appears to be fibrogranular overall with sufficient bleeding.  No probing to bone.  Deep tissue injury present to the posterior aspect of the right heel from pressure but no skin opening present and no overlying signs of infection.  MSK: Deferred due to patient state.  Pain on palpation of the left posterior heel and right posterior heel.  Results for orders placed or performed during the hospital encounter of 02/09/19 (from the past 48 hour(s))  Glucose, capillary     Status: None   Collection Time: 02/16/19  9:17 PM  Result Value Ref Range   Glucose-Capillary 78 70 - 99 mg/dL  Ammonia     Status: None   Collection Time: 02/17/19  5:33 AM  Result Value Ref Range   Ammonia 16 9 - 35 umol/L    Comment: Performed at [facility], [address] [place], [place] [address]  Renal function panel     Status: Abnormal   Collection Time: 02/17/19  5:33 AM  Result Value Ref Range   Sodium 139 135 - 145 mmol/L   Potassium 4.0 3.5 - 5.1 mmol/L   Chloride 102 98 - 111 mmol/L   CO2 25 22 - 32 mmol/L   Glucose, Bld 101 (H) 70 - 99 mg/dL   BUN 14 8 - 23 mg/dL   Creatinine, Ser 3.27 (H) 0.44 - 1.00 mg/dL   Calcium 8.1 (L) 8.9 - 10.3 mg/dL   Phosphorus 1.8 (L) 2.5 - 4.6 mg/dL   Albumin 2.0 (L) 3.5 - 5.0 g/dL   GFR calc non Af Amer 14 (L) >60 mL/min   GFR calc Af Amer 16 (L) >60 mL/min   Anion gap 12 5 - 15    Comment: Performed at [facility], [address] [place], [place] [address]  Glucose, capillary     Status: Abnormal   Collection Time: 02/17/19  7:44 AM  Result Value Ref Range   Glucose-Capillary 105 (H) 70 - 99 mg/dL  Glucose, capillary     Status: Abnormal   Collection Time: 02/17/19 11:23 AM  Result Value Ref Range   Glucose-Capillary 119 (H) 70 - 99 mg/dL  Aerobic Culture (superficial specimen)     Status: None  (Preliminary result)   Collection Time: 02/17/19  1:31 PM   Specimen: Heel; Wound  Result Value Ref Range   Specimen Description      HEEL Performed at [facility], [address], [place], [place] [address]    Special Requests      NONE Performed at [facility], [place], [place] [address]    Gram Stain      NO WBC SEEN MODERATE GRAM POSITIVE COCCI IN PAIRS    Culture      ABUNDANT STAPHYLOCOCCUS AUREUS CULTURE REINCUBATED FOR BETTER GROWTH Performed at [facility], [address], [place], [place] [address]    Report Status PENDING   Blood gas, arterial     Status: Abnormal   Collection Time: 02/17/19  3:58 PM  Result Value Ref Range   FIO2 0.21    pH, Arterial 7.54 (H) 7.350 - 7.450   pCO2 arterial 30 (L) 32.0 - 48.0 mmHg   pO2, Arterial 92 83.0 - 108.0 mmHg   Bicarbonate 25.7 20.0 - 28.0 mmol/L   Acid-Base Excess 3.8 (H) 0.0 - 2.0 mmol/L   O2 Saturation 98.0 %   Patient temperature 37.0    Collection site LEFT RADIAL    Sample type ARTERIAL DRAW    Allens test (pass/fail) PASS PASS    Comment: Performed at  [facility], [place], [place] [address]  Glucose, capillary     Status: Abnormal   Collection Time: 02/17/19  4:35 PM  Result Value Ref Range   Glucose-Capillary 153 (H) 70 - 99 mg/dL  Heparin level (unfractionated)     Status: None   Collection Time: 02/17/19  8:11 PM  Result Value Ref Range   Heparin Unfractionated 0.68 0.30 - 0.70 IU/mL    Comment: (NOTE) If heparin results are below expected values, and patient dosage has  been confirmed, suggest follow up testing of antithrombin III levels. Performed at [facility], [address] [place], [place] [address]   Protime-INR     Status: Abnormal   Collection Time: 02/17/19  8:11 PM  Result Value Ref Range   Prothrombin Time 15.6 (H) 11.4 - 15.2 seconds   INR 1.3 (H) 0.8 - 1.2    Comment: (NOTE) INR goal varies based on  device and disease states. Performed at [facility], [address] [place], [place] [address]   APTT     Status: Abnormal   Collection Time: 02/17/19  8:11 PM  Result Value Ref Range   aPTT >160 (HH) 24 - 36 seconds    Comment:        IF BASELINE aPTT IS ELEVATED, SUGGEST PATIENT RISK ASSESSMENT BE USED TO DETERMINE APPROPRIATE ANTICOAGULANT THERAPY. REPEATED TO VERIFY CRITICAL RESULT CALLED TO, READ BACK BY AND VERIFIED WITH: ANN [facility] RN [id number] 02/17/2019 HNM Performed at [facility], [address] [place], [place] [address]   Glucose, capillary     Status: Abnormal   Collection Time: 02/17/19 11:00 PM  Result Value Ref Range   Glucose-Capillary 122 (H) 70 - 99 mg/dL  CBC     Status: Abnormal   Collection Time: 02/18/19  2:15 AM  Result Value Ref Range   WBC 10.6 (H) 4.0 - 10.5 K/uL   RBC 3.37 (L) 3.87 - 5.11 MIL/uL   Hemoglobin 9.4 (L) 12.0 - 15.0 g/dL   HCT 28.4 (L) 36.0 - 46.0 %   MCV 84.3 80.0 - 100.0 fL   MCH 27.9 26.0 - 34.0 pg   MCHC 33.1 30.0 - 36.0 g/dL   RDW 19.0 (H) 11.5 - 15.5 %   Platelets 177 150 - 400 K/uL   nRBC 0.0 0.0 - 0.2 %    Comment: Performed at [facility], [address] [place], [place] [address]  Basic metabolic panel     Status: Abnormal   Collection Time: 02/18/19  2:15 AM  Result Value Ref Range   Sodium 137 135 - 145 mmol/L   Potassium 4.0 3.5 - 5.1 mmol/L    Comment: HEMOLYSIS AT THIS LEVEL MAY AFFECT RESULT   Chloride 100 98 - 111 mmol/L   CO2 23 22 - 32 mmol/L   Glucose, Bld 128 (H) 70 - 99 mg/dL   BUN 18 8 - 23 mg/dL   Creatinine, Ser 3.95 (H) 0.44 - 1.00 mg/dL   Calcium 8.0 (L) 8.9 - 10.3 mg/dL   GFR calc non Af Amer 11 (L) >60 mL/min   GFR calc Af Amer 13 (L) >60 mL/min   Anion gap 14 5 - 15    Comment: Performed at [facility], [address] [place], [place] [address]  Magnesium     Status: None   Collection Time: 02/18/19  2:15 AM  Result Value Ref Range   Magnesium 1.7  1.7 - 2.4 mg/dL  Comment: Performed at [facility], [address] [place], [place] [address]  Phosphorus     Status: None   Collection Time: 02/18/19  2:15 AM  Result Value Ref Range   Phosphorus 2.7 2.5 - 4.6 mg/dL    Comment: Performed at [facility], [address] [place], [place] [address]  APTT     Status: Abnormal   Collection Time: 02/18/19  2:15 AM  Result Value Ref Range   aPTT 109 (H) 24 - 36 seconds    Comment:        IF BASELINE aPTT IS ELEVATED, SUGGEST PATIENT RISK ASSESSMENT BE USED TO DETERMINE APPROPRIATE ANTICOAGULANT THERAPY. Performed at [facility], [place], [place] [address]   Heparin level (unfractionated)     Status: Abnormal   Collection Time: 02/18/19  2:15 AM  Result Value Ref Range   Heparin Unfractionated 0.29 (L) 0.30 - 0.70 IU/mL    Comment: (NOTE) If heparin results are below expected values, and patient dosage has  been confirmed, suggest follow up testing of antithrombin III levels. Performed at [facility], [address] [place], [place] [address]   Glucose, capillary     Status: Abnormal   Collection Time: 02/18/19  8:10 AM  Result Value Ref Range   Glucose-Capillary 163 (H) 70 - 99 mg/dL   Comment 1 Notify RN   Phosphorus     Status: None   Collection Time: 02/18/19 12:00 PM  Result Value Ref Range   Phosphorus 3.0 2.5 - 4.6 mg/dL    Comment: Performed at [facility], [address] [place], [place] [address]  Ammonia     Status: None   Collection Time: 02/18/19  1:34 PM  Result Value Ref Range   Ammonia 10 9 - 35 umol/L    Comment: Performed at [facility], [address] [place], [place] [address]   CT CERVICAL SPINE W CONTRAST  Result Date: 02/17/2019 CLINICAL DATA:  Persistent MRSA bacteremia EXAM: CT CERVICAL SPINE WITH CONTRAST TECHNIQUE: Multidetector CT imaging of the cervical spine was performed during intravenous contrast  administration. Multiplanar CT image reconstructions were also generated. CONTRAST:  119m OMNIPAQUE IOHEXOL 300 MG/ML  SOLN COMPARISON:  2019 MRI FINDINGS: Alignment: Preserved. Skull base and vertebrae: Vertebral body heights are maintained. There is no cortical destruction. Soft tissues and spinal canal: No prevertebral soft tissue swelling. No evidence of an epidural collection within limitation of artifact obscuring the spinal canal. Disc levels: Multilevel degenerative changes are present without high-grade canal or foraminal stenosis. Upper chest: Partially imaged pleural effusions. Other: None. IMPRESSION: No evidence of discitis/osteomyelitis or collection. Electronically Signed   By: PMacy MisM.D.   On: 02/17/2019 15:57   CT THORACIC SPINE W CONTRAST  Result Date: 02/17/2019 CLINICAL DATA:  MRSA bacteremia EXAM: CT THORACIC SPINE WITH CONTRAST TECHNIQUE: Multidetector CT images of thoracic was performed according to the standard protocol following intravenous contrast administration. CONTRAST:  10100mOMNIPAQUE IOHEXOL 300 MG/ML  SOLN COMPARISON:  None. FINDINGS: Alignment: Preserved. Vertebrae: Vertebral body heights are maintained. There is no cortical destruction. Paraspinal and other soft tissues: Bilateral pleural effusions and bibasilar atelectasis/consolidation. Partially imaged cardiomegaly. Enlarged main pulmonary artery suggesting pulmonary arterial hypertension. Disc levels: Mild multilevel degenerative changes. There is no high-grade osseous encroachment on the spinal canal or neural foramina. IMPRESSION: No evidence of discitis/osteomyelitis in the thoracic spine. No collection. Bilateral pleural effusions and bibasilar atelectasis/consolidation. Electronically Signed   By: PrMacy Mis  M.D.   On: 02/17/2019 16:17   CT LUMBAR SPINE W CONTRAST  Result Date: 02/17/2019 CLINICAL DATA:  MRSA bacteremia EXAM: CT LUMBAR SPINE WITH CONTRAST TECHNIQUE: Multidetector CT imaging of the  lumbar spine was performed with intravenous contrast administration. CONTRAST:  153m OMNIPAQUE IOHEXOL 300 MG/ML  SOLN COMPARISON:  Via FINDINGS: Segmentation: 5 lumbar type vertebrae. Alignment: Normal. Vertebrae: Vertebral body heights are maintained. There is no cortical destruction. Paraspinal and other soft tissues: Mild presacral edema. No paraspinal collection. Disc levels: There is disc space narrowing and vacuum phenomenon at L5-S1. Multilevel facet hypertrophy is present. Left foraminal stenosis is present at L5-S1. No high-grade canal stenosis with suboptimal evaluation of the spinal canal due to artifact. IMPRESSION: No evidence of discitis/osteomyelitis in the lumbar spine. Nonspecific mild presacral edema. No paraspinal collection. Electronically Signed   By: PMacy MisM.D.   On: 02/17/2019 16:10   [place] UPPER EXTREM LTD SOFT TISSUE NON VASCULAR  Result Date: 02/17/2019 CLINICAL DATA:  Evaluate for abscess around AV fistula. MRSA bacteremia. EXAM: ULTRASOUND RIGHT UPPER EXTREMITY LIMITED TECHNIQUE: Ultrasound examination of the upper extremity soft tissues was performed in the area of clinical concern. COMPARISON:  None. FINDINGS: Visualized portions of the AV fistula are patent. There is a 4.6 x 3.1 x 3.1 cm hypoechoic fluid collection surrounding the distal fistula. There is no internal vascularity or surrounding hyperemia. IMPRESSION: 1. 4.6 x 3.1 x 3.1 cm hypoechoic fluid collection surrounding the distal AV fistula. Appearance is more suggestive of a small hematoma given the lack of surrounding hyperemia, especially if this is a common site of fistula access. Superimposed infection is thought to be unlikely, but cannot be entirely excluded by imaging alone. Electronically Signed   By: WTitus DubinM.D.   On: 02/17/2019 07:41    Blood pressure (!) 119/56, pulse 71, temperature 98.8 F (37.1 C), temperature source Oral, resp. rate 11, height '5\' 4"'$  (1.626 m), weight 74.9 kg, SpO2 95  %.  Assessment 1. Stable eschar present to the posterior aspect of the left heel 2. Deep tissue injury to the posterior aspect of the right heel 3. Diabetes type 2 with polyneuropathy 4. Bacteremia with unknown origin 5. Hx of COVID19+  Plan -Both feet examined -Posterior right heel appears to have deep tissue injury but no open ulceration at this time due to pressure.  Still recommend wearing the waffle type pillow boot at all time to this extremity. -Eschar noted to the posterior aspect the left heel due to pressure.  No overt signs of infection present as there is only minimal erythema to the periwound area with no drainage.  100% subcutaneous wound debridement was performed to this area removing a portion of the stable eschar that was present as able.  This was performed with 15 blade and pickups without incident.  Once the eschar was removed no purulence was able to be expressed and the tissues appeared to be healthy and granular overall.  No probing to bone. -X-ray ordered for the left foot.  Do not expect to see any changes consistent with osteomyelitis as the wound does not probe to bone. -Wound culture taken yesterday growing staph aureus.  Would overall highly doubt that this is likely the source of bacteremia as it does not appear to be acutely infected with examination. -Recommend Betadine dressings every other day changed to the left heel to keep the area dry and stable.  Recommend wearing the pillow boot at all times.  Podiatry team to sign  off at this time.  Reconsult if any problems arise.  Discharge instructions placed in chart.  Caroline More, DPM 02/18/2019, 4:41 PM

## 2019-02-18 NOTE — Progress Notes (Signed)
Post HD Tx Assessment    02/18/19 1430  Neurological  Level of Consciousness Alert  Orientation Level Disoriented to place;Disoriented to time;Disoriented to situation  Respiratory  Respiratory Pattern Regular;Unlabored  Chest Assessment Chest expansion symmetrical  Bilateral Breath Sounds Diminished  Cardiac  Pulse Irregular  Cardiac Rhythm NSR  Heart Block Type 1st degree AVB  Ectopy PAC  Antiarrhythmic device No  Vascular  R Radial Pulse +2  L Radial Pulse +2  R Dorsalis Pedis Pulse +1  L Dorsalis Pedis Pulse +1  Edema Generalized;Right upper extremity;Left upper extremity  Generalized Edema +2  RUE Edema +3  LUE Edema +3  RLE Edema +1  LLE Edema +1  Integumentary  Integumentary (WDL) X  Skin Color Appropriate for ethnicity  Skin Condition Dry  Skin Integrity Abrasion;Ecchymosis;Skin tear;Weeping  Ecchymosis Location Arm;Neck  Ecchymosis Location Orientation Bilateral  Weeping Location Arm  Weeping Location Orientation Left  Musculoskeletal  Musculoskeletal (WDL) X  Generalized Weakness Yes  Gastrointestinal  Bowel Sounds Assessment Active  Last BM Date 02/17/19  GU Assessment  Genitourinary (WDL) X  Genitourinary Symptoms Oliguria  Psychosocial  Psychosocial (WDL) X  Patient Behaviors Flat affect  Needs Expressed Physical  Psychosocial Additional Assessments Family behavior  Family Behavior Supportive;Calm;Cooperative  Emotional support given Given to patient;Given to patient's family

## 2019-02-18 NOTE — Progress Notes (Signed)
PROGRESS NOTE    Jamie Arias  [id number] DOB: [birth year] DOA: 02/09/2019 PCP: [name], MD    Brief Narrative:  63 year old female with end-stage renal disease on hemodialysis, admitted to the hospital with severe septic shock second encephalopathic and is unable to consent to the procedure ary to MRSA bacteremia. She had associated metabolic encephalopathy. She is being treated with intravenous vancomycin. She is not requiring any further pressors. Encephalopathy has been slow to improve.  Bacteremia has been persistent without any clear etiology.  CT spine unrevealing for any obvious infectious focus.  Infectious disease following.  Cardiology following.  Plan for inpatient transesophageal echocardiogram currently delayed as patient persistently encephalopathic and is unable to consent for the procedure  Assessment & Plan:   Principal Problem:   Sepsis due to methicillin resistant Staphylococcus aureus (MRSA) ([place]) Active Problems:   Diabetes mellitus, type 2 (HCC)   Hypotension   Chronic diastolic CHF (congestive heart failure) (HCC)   ESRD (end stage renal disease) (HCC)   Altered level of consciousness   Pressure injury of skin   MRSA bacteremia   Arm DVT (deep venous thromboembolism), acute, left ([place])   Acute encephalopathy   1. Septic shock secondary to MRSA bacteremia.  Shock resolved Source control not achieved Patient remains persistently bacteremic Source of bacteremia is unclear TTE unrevealing CT spine unrevealing for obvious infectious focus TEE planned however patient has been too encephalopathic and cannot consent to the procedure and presents a high risk for anesthesia Staph aureus isolated from heel wound Last positive blood culture on 02/15/2019 Plan: Continue current antibiotic.  Treatment regimen of daptomycin and ceftaroline Repeat blood cultures today to assess for clearance of bacteremia Continue to monitor mental status and neurologic  recovery with tentative plans for TEE during this admission Request podiatry input for heel wound, recs appreciated   2. End-stage renal disease on hemodialysis.  Nephrology following for dialysis needs.   3. Acute encephalopathy.  Possible sepsis related MRI of the brain did not show any acute infarct.  EEG did not show any evidence of seizures.  She was noted to have twitching in her lower jaw, but her husband reports this is been present for the past several months.   Ammonia level is normal.   ABG does not show significant CO2 retention. -Continue antibiotic regimen for treatment of MRSA bacteremia -Continue to monitor mental status closely, frequent reorientation measures  4. Acute left arm DVT.  Noted on venous Dopplers.  Started on heparin infusion, but this was held due to oozing around central line.   He had a central line has been removed Continue heparin  5. History of atrial fibrillation.  She was taking amiodarone and Eliquis prior to admission.  Currently in sinus rhythm.  Continue to follow heart rate.  On heparin for left upper extremity DVT  6. Chronic diastolic congestive heart failure.  Appears to have significant edema.  Can hopefully remove volume with dialysis.  7. Diabetes.  Continue on sliding scale insulin.  Blood sugars are currently stable.  8. Acute on chronic anemia.  Related to blood loss from oozing around IV sites.  Improved after 1 unit prbc on 12/13  9. Goals of care.   With patient's multiple medical issues, poor functional nutritional status, her prognosis is poor.   Request palliative care to address goals of care Per notes plan to meet with husband and patient at bedside   DVT prophylaxis: Heparin GTT Code Status: Full code Family Communication: none  present Disposition Plan: May need placement pending hospital course   Consultants:   ID  Nephrology  Cardiology  Procedures:   Right femoral CVC 12/8> removed  Right  femoral art line 12/8> removed  Antimicrobials:   Vancomycin 12/7>12/14  Daptomycin 12/14>  ceftaroline 12/14>   Subjective: Patient is somnolent. She opens her eyes to voice and answers questions. She denies any pain or shortness of breath  Objective: Vitals:   02/18/19 1315 02/18/19 1330 02/18/19 1345 02/18/19 1400  BP: (!) 87/50 (!) 97/51    Pulse: 70 69    Resp: (!) 6 (!) 9    Temp:      TempSrc:      SpO2: 95% 95% 95% 95%  Weight:      Height:        Intake/Output Summary (Last 24 hours) at 02/18/2019 1429 Last data filed at 02/18/2019 0330 Gross per 24 hour  Intake 360.16 ml  Output --  Net 360.16 ml   Filed Weights   02/12/19 1445 02/17/19 2302 02/18/19 1050  Weight: 78.2 kg 74.1 kg 74.9 kg    Examination:  General exam: somnolent, but wakes up to voice Respiratory system: diminished breath sounds bilaterally. Respiratory effort normal. Cardiovascular system:RRR. No murmurs, rubs, gallops. Gastrointestinal system: Abdomen is nondistended, soft and nontender. No organomegaly or masses felt. Normal bowel sounds heard. Central nervous system: No focal neurological deficits. Extremities: 1-2+ edema bilaterally Skin: No rashes, lesions or ulcers Psychiatry: somnolent.    Data Reviewed: I have personally reviewed following labs and imaging studies  CBC: Recent Labs  Lab 02/13/19 0453 02/14/19 0340 02/15/19 0545 02/15/19 1824 02/16/19 0433 02/18/19 0215  WBC 6.9 8.0 8.9  --  8.9 10.6*  HGB 7.9* 7.2* 6.8* 8.8* 8.7* 9.4*  HCT 24.7* 22.2* 20.0* 25.0* 24.7* 28.4*  MCV 86.1 83.8 80.3  --  79.7* 84.3  PLT 107* 106* 105*  --  126* [id number]   Basic Metabolic Panel: Recent Labs  Lab 02/13/19 0453 02/14/19 0340 02/15/19 0545 02/16/19 0433 02/17/19 0533 02/18/19 0215 02/18/19 1200  NA 143 142 138 139 139 137  --   K 3.2* 3.1* 3.6 3.4* 4.0 4.0  --   CL 103 104 102 102 102 100  --   CO2 27 26 24 24 25 23   --   GLUCOSE 108* 109* 96 166* 101* 128*  --    BUN 17 23 26* 30* 14 18  --   CREATININE 3.31* 4.03* 4.64* 5.30* 3.27* 3.95*  --   CALCIUM 8.4* 8.0* 7.9* 8.2* 8.1* 8.0*  --   MG 2.3 2.2 2.2 1.9  --  1.7  --   PHOS 1.7* 2.2* 2.2* 2.5 1.8* 2.7 3.0   GFR: Estimated Creatinine Clearance: 14.1 mL/min (A) (by C-G formula based on SCr of 3.95 mg/dL (H)). Liver Function Tests: Recent Labs  Lab 02/12/19 0444 02/13/19 0453 02/14/19 0340 02/16/19 0433 02/17/19 0533  ALBUMIN 1.9* 2.2* 2.2* 2.1* 2.0*   No results for input(s): LIPASE, AMYLASE in the last 168 hours. Recent Labs  Lab 02/17/19 0533 02/18/19 1334  AMMONIA 16 10   Coagulation Profile: Recent Labs  Lab 02/17/19 2011  INR 1.3*   Cardiac Enzymes: Recent Labs  Lab 02/16/19 0433  CKTOTAL 17*   BNP (last 3 results) No results for input(s): PROBNP in the last 8760 hours. HbA1C: No results for input(s): HGBA1C in the last 72 hours. CBG: Recent Labs  Lab 02/17/19 0744 02/17/19 1123 02/17/19 1635 02/17/19 2300 02/18/19 0810  GLUCAP 105* 119* 153* 122* 163*   Lipid Profile: No results for input(s): CHOL, HDL, LDLCALC, TRIG, CHOLHDL, LDLDIRECT in the last 72 hours. Thyroid Function Tests: No results for input(s): TSH, T4TOTAL, FREET4, T3FREE, THYROIDAB in the last 72 hours. Anemia Panel: No results for input(s): VITAMINB12, FOLATE, FERRITIN, TIBC, IRON, RETICCTPCT in the last 72 hours. Sepsis Labs: No results for input(s): PROCALCITON, LATICACIDVEN in the last 168 hours.  Recent Results (from the past 240 hour(s))  Culture, blood (routine x 2)     Status: Abnormal   Collection Time: 02/09/19  2:29 PM   Specimen: BLOOD LEFT ARM  Result Value Ref Range Status   Specimen Description   Final    BLOOD LEFT ARM Performed at [facility], [address] [place], [place] [address]    Special Requests   Final    BOTTLES DRAWN AEROBIC AND ANAEROBIC Blood Culture adequate volume Performed at [facility], [address], [place], [place]  [address]    Culture  Setup Time   Final    GRAM POSITIVE COCCI IN BOTH AEROBIC AND ANAEROBIC BOTTLES CRITICAL VALUE NOTED.  VALUE IS CONSISTENT WITH PREVIOUSLY REPORTED AND CALLED VALUE. Performed at [facility], [address] [place], [place] [address]    Culture (A)  Final    STAPHYLOCOCCUS AUREUS SUSCEPTIBILITIES PERFORMED ON PREVIOUS CULTURE WITHIN THE LAST 5 DAYS. Performed at [facility], [place] [address] [place], [place] [address]    Report Status 02/12/2019 FINAL  Final  Culture, blood (routine x 2)     Status: Abnormal   Collection Time: 02/09/19  2:38 PM   Specimen: BLOOD LEFT ARM  Result Value Ref Range Status   Specimen Description   Final    BLOOD LEFT ARM Performed at [facility], [address] [place], [place] [address]    Special Requests   Final    BOTTLES DRAWN AEROBIC AND ANAEROBIC Blood Culture adequate volume Performed at [facility], [address], [place], [place] [address]    Culture  Setup Time   Final    GRAM POSITIVE COCCI IN BOTH AEROBIC AND ANAEROBIC BOTTLES CRITICAL RESULT CALLED TO, READ BACK BY AND VERIFIED WITH: [place] [id number] 02/10/2019 HNM Performed at [facility], [place] [address], [place], [place] [address]    Culture METHICILLIN RESISTANT STAPHYLOCOCCUS AUREUS (A)  Final   Report Status 02/12/2019 FINAL  Final   Organism ID, Bacteria METHICILLIN RESISTANT STAPHYLOCOCCUS AUREUS  Final      Susceptibility   Methicillin resistant staphylococcus aureus - MIC*    CIPROFLOXACIN >=8 RESISTANT Resistant     ERYTHROMYCIN >=8 RESISTANT Resistant     GENTAMICIN <=0.5 SENSITIVE Sensitive     OXACILLIN >=4 RESISTANT Resistant     TETRACYCLINE <=1 SENSITIVE Sensitive     VANCOMYCIN 1 SENSITIVE Sensitive     TRIMETH/SULFA <=10 SENSITIVE Sensitive     CLINDAMYCIN RESISTANT Resistant     RIFAMPIN <=0.5 SENSITIVE Sensitive     Inducible Clindamycin POSITIVE Resistant     * METHICILLIN RESISTANT  STAPHYLOCOCCUS AUREUS  Blood Culture ID Panel (Reflexed)     Status: Abnormal   Collection Time: 02/09/19  2:38 PM  Result Value Ref Range Status   Enterococcus species NOT DETECTED NOT DETECTED Final   Listeria monocytogenes NOT DETECTED NOT DETECTED Final   Staphylococcus species DETECTED (A) NOT DETECTED Final    Comment: CRITICAL RESULT CALLED TO, READ BACK BY AND VERIFIED WITH:  Nicki Reaper HALL PHARMD [id number] 02/10/2019 HNM    Staphylococcus aureus (BCID) DETECTED (A) NOT DETECTED Final    Comment: Methicillin (oxacillin)-resistant Staphylococcus aureus (MRSA). MRSA is predictably resistant to beta-lactam antibiotics (except ceftaroline). Preferred therapy is vancomycin unless clinically contraindicated. Patient requires contact precautions if  hospitalized. CRITICAL RESULT CALLED TO, READ BACK BY AND VERIFIED WITH: Hart Robinsons PHARMD [id number] 02/10/2019 HNM    Methicillin resistance DETECTED (A) NOT DETECTED Final    Comment: CRITICAL RESULT CALLED TO, READ BACK BY AND VERIFIED WITH: Hart Robinsons PHARMD [id number] 02/10/2019 HNM    Streptococcus species NOT DETECTED NOT DETECTED Final   Streptococcus agalactiae NOT DETECTED NOT DETECTED Final   Streptococcus pneumoniae NOT DETECTED NOT DETECTED Final   Streptococcus pyogenes NOT DETECTED NOT DETECTED Final   Acinetobacter baumannii NOT DETECTED NOT DETECTED Final   Enterobacteriaceae species NOT DETECTED NOT DETECTED Final   Enterobacter cloacae complex NOT DETECTED NOT DETECTED Final   Escherichia coli NOT DETECTED NOT DETECTED Final   Klebsiella oxytoca NOT DETECTED NOT DETECTED Final   Klebsiella pneumoniae NOT DETECTED NOT DETECTED Final   Proteus species NOT DETECTED NOT DETECTED Final   Serratia marcescens NOT DETECTED NOT DETECTED Final   Haemophilus influenzae NOT DETECTED NOT DETECTED Final   Neisseria meningitidis NOT DETECTED NOT DETECTED Final   Pseudomonas aeruginosa NOT DETECTED NOT DETECTED Final   Candida albicans NOT DETECTED NOT  DETECTED Final   Candida glabrata NOT DETECTED NOT DETECTED Final   Candida krusei NOT DETECTED NOT DETECTED Final   Candida parapsilosis NOT DETECTED NOT DETECTED Final   Candida tropicalis NOT DETECTED NOT DETECTED Final    Comment: Performed at [facility], [place], [place] [address]  SARS CORONAVIRUS 2 (TAT 6-24 HRS) Nasopharyngeal Nasopharyngeal Swab     Status: None   Collection Time: 02/09/19  2:54 PM   Specimen: Nasopharyngeal Swab  Result Value Ref Range Status   SARS Coronavirus 2 NEGATIVE NEGATIVE Final    Comment: (NOTE) SARS-CoV-2 target nucleic acids are NOT DETECTED. The SARS-CoV-2 RNA is generally detectable in upper and lower respiratory specimens during the acute phase of infection. Negative results do not preclude SARS-CoV-2 infection, do not rule out co-infections with other pathogens, and should not be used as the sole basis for treatment or other patient management decisions. Negative results must be combined with clinical observations, patient history, and epidemiological information. The expected result is Negative. Fact Sheet for Patients: SugarRoll.be Fact Sheet for Healthcare Providers: https://www.woods-mathews.com/ This test is not yet approved or cleared by the [place] FDA and  has been authorized for detection and/or diagnosis of SARS-CoV-2 by FDA under an Emergency Use Authorization (EUA). This EUA will remain  in effect (meaning this test can be used) for the duration of the COVID-19 declaration under Section 56 4(b)(1) of the Act, 21 U.S.C. section 360bbb-3(b)(1), unless the authorization is terminated or revoked sooner. Performed at [facility], [place] [address], [place], [place] [address]   MRSA PCR Screening     Status: Abnormal   Collection Time: 02/10/19  5:03 PM   Specimen: Nasopharyngeal  Result Value Ref Range Status   MRSA by PCR POSITIVE (A) NEGATIVE Final      Comment:        The GeneXpert MRSA Assay (FDA approved for NASAL specimens only), is one component of a comprehensive MRSA colonization surveillance program. It is not intended to diagnose MRSA infection nor to guide or monitor treatment for MRSA infections. RESULT  CALLED TO, READ BACK BY AND VERIFIED WITH: ALEKSEY FRASER @1839  02/10/19 MJU Performed at [facility], [address] [place], [place] [address]   Culture, blood (routine x 2)     Status: Abnormal   Collection Time: 02/11/19 12:31 PM   Specimen: BLOOD  Result Value Ref Range Status   Specimen Description   Final    BLOOD PORTA CATH Performed at [facility], [place] [address] [place], [place] [address]    Special Requests   Final    BOTTLES DRAWN AEROBIC AND ANAEROBIC Blood Culture adequate volume Performed at [facility], [address] [place], [place] [address]    Culture  Setup Time   Final    GRAM POSITIVE COCCI ANAEROBIC BOTTLE ONLY CRITICAL RESULT CALLED TO, READ BACK BY AND VERIFIED WITH: SCOTT HALL AT [id number] 02/12/2019 [facility]  Performed at [facility], [address] [place], [place] [address]    Culture (A)  Final    STAPHYLOCOCCUS AUREUS SUSCEPTIBILITIES PERFORMED ON PREVIOUS CULTURE WITHIN THE LAST 5 DAYS. Performed at [facility], [place] [address] [place], [place] [address]    Report Status 02/13/2019 FINAL  Final  Culture, blood (routine x 2)     Status: None   Collection Time: 02/11/19  3:57 PM   Specimen: BLOOD  Result Value Ref Range Status   Specimen Description BLOOD PORTA CATH  Final   Special Requests   Final    BOTTLES DRAWN AEROBIC AND ANAEROBIC Blood Culture adequate volume   Culture   Final    NO GROWTH 5 DAYS Performed at [facility], [address] [place], [place] [address]    Report Status 02/16/2019 FINAL  Final  CULTURE, BLOOD (ROUTINE X 2) w Reflex to ID Panel     Status: Abnormal   Collection Time: 02/13/19  2:18 PM    Specimen: BLOOD LEFT HAND  Result Value Ref Range Status   Specimen Description BLOOD LEFT HAND  Final   Special Requests   Final    BOTTLES DRAWN AEROBIC AND ANAEROBIC Blood Culture results may not be optimal due to an inadequate volume of blood received in culture bottles   Culture  Setup Time   Final    AEROBIC BOTTLE ONLY GRAM POSITIVE COCCI IN CLUSTERS CRITICAL RESULT CALLED TO, READ BACK BY AND VERIFIED WITH: Herbert Pun AT [id number] ON 02/14/2019 [place]. Performed at [facility], [place] [address], [place], [place] [address]    Culture METHICILLIN RESISTANT STAPHYLOCOCCUS AUREUS (A)  Final   Report Status 02/16/2019 FINAL  Final   Organism ID, Bacteria METHICILLIN RESISTANT STAPHYLOCOCCUS AUREUS  Final      Susceptibility   Methicillin resistant staphylococcus aureus - MIC*    CIPROFLOXACIN >=8 RESISTANT Resistant     ERYTHROMYCIN >=8 RESISTANT Resistant     GENTAMICIN <=0.5 SENSITIVE Sensitive     OXACILLIN >=4 RESISTANT Resistant     TETRACYCLINE <=1 SENSITIVE Sensitive     VANCOMYCIN <=0.5 SENSITIVE Sensitive     TRIMETH/SULFA <=10 SENSITIVE Sensitive     CLINDAMYCIN RESISTANT Resistant     RIFAMPIN <=0.5 SENSITIVE Sensitive     Inducible Clindamycin POSITIVE Resistant     * METHICILLIN RESISTANT STAPHYLOCOCCUS AUREUS  Blood Culture ID Panel (Reflexed)     Status: Abnormal   Collection Time: 02/13/19  2:18 PM  Result Value Ref Range Status   Enterococcus species NOT DETECTED NOT DETECTED Final   Listeria monocytogenes NOT DETECTED NOT  DETECTED Final   Staphylococcus species DETECTED (A) NOT DETECTED Final    Comment: CRITICAL RESULT CALLED TO, READ BACK BY AND VERIFIED WITH: Herbert Pun AT [id number] ON 02/14/2019 [place].    Staphylococcus aureus (BCID) DETECTED (A) NOT DETECTED Final    Comment: Methicillin (oxacillin)-resistant Staphylococcus aureus (MRSA). MRSA is predictably resistant to beta-lactam antibiotics (except ceftaroline). Preferred therapy is vancomycin  unless clinically contraindicated. Patient requires contact precautions if  hospitalized. CRITICAL RESULT CALLED TO, READ BACK BY AND VERIFIED WITH: Herbert Pun AT [id number] ON 02/14/2019 [place].    Methicillin resistance DETECTED (A) NOT DETECTED Final    Comment: CRITICAL RESULT CALLED TO, READ BACK BY AND VERIFIED WITH: Herbert Pun AT [id number] ON 02/14/2019 [place].    Streptococcus species NOT DETECTED NOT DETECTED Final   Streptococcus agalactiae NOT DETECTED NOT DETECTED Final   Streptococcus pneumoniae NOT DETECTED NOT DETECTED Final   Streptococcus pyogenes NOT DETECTED NOT DETECTED Final   Acinetobacter baumannii NOT DETECTED NOT DETECTED Final   Enterobacteriaceae species NOT DETECTED NOT DETECTED Final   Enterobacter cloacae complex NOT DETECTED NOT DETECTED Final   Escherichia coli NOT DETECTED NOT DETECTED Final   Klebsiella oxytoca NOT DETECTED NOT DETECTED Final   Klebsiella pneumoniae NOT DETECTED NOT DETECTED Final   Proteus species NOT DETECTED NOT DETECTED Final   Serratia marcescens NOT DETECTED NOT DETECTED Final   Haemophilus influenzae NOT DETECTED NOT DETECTED Final   Neisseria meningitidis NOT DETECTED NOT DETECTED Final   Pseudomonas aeruginosa NOT DETECTED NOT DETECTED Final   Candida albicans NOT DETECTED NOT DETECTED Final   Candida glabrata NOT DETECTED NOT DETECTED Final   Candida krusei NOT DETECTED NOT DETECTED Final   Candida parapsilosis NOT DETECTED NOT DETECTED Final   Candida tropicalis NOT DETECTED NOT DETECTED Final    Comment: Performed at [facility], [address] [place], [place] [address]  CULTURE, BLOOD (ROUTINE X 2) w Reflex to ID Panel     Status: None   Collection Time: 02/13/19  3:09 PM   Specimen: BLOOD  Result Value Ref Range Status   Specimen Description BLOOD BLOOD LEFT HAND  Final   Special Requests   Final    BOTTLES DRAWN AEROBIC AND ANAEROBIC Blood Culture adequate volume   Culture   Final    NO GROWTH 5  DAYS Performed at [facility], [address], [place], [place] [address]    Report Status 02/18/2019 FINAL  Final  Culture, blood (routine x 2)     Status: Abnormal   Collection Time: 02/15/19 12:49 PM   Specimen: BLOOD  Result Value Ref Range Status   Specimen Description   Final    BLOOD LT HAND Performed at [facility], [address] [place], [place] [address]    Special Requests   Final    BOTTLES DRAWN AEROBIC AND ANAEROBIC Blood Culture results may not be optimal due to an inadequate volume of blood received in culture bottles Performed at [facility], [address] [place], [place] [address]    Culture  Setup Time   Final    GRAM POSITIVE COCCI IN BOTH AEROBIC AND ANAEROBIC BOTTLES CRITICAL RESULT CALLED TO, READ BACK BY AND VERIFIED WITHEleonore Chiquito AT [id number] 02/16/2019 SDR Performed at [facility], [address] [place],  [address]    Culture (A)  Final    METHICILLIN RESISTANT STAPHYLOCOCCUS AUREUS STAPHYLOCOCCUS SPECIES (COAGULASE NEGATIVE) THE SIGNIFICANCE OF ISOLATING THIS ORGANISM FROM A SINGLE SET OF  BLOOD CULTURES WHEN MULTIPLE SETS ARE DRAWN IS UNCERTAIN. PLEASE NOTIFY THE MICROBIOLOGY DEPARTMENT WITHIN ONE WEEK IF SPECIATION AND SENSITIVITIES ARE REQUIRED. Performed at [facility], [place] [address], [place], [place] [address]    Report Status 02/18/2019 FINAL  Final   Organism ID, Bacteria METHICILLIN RESISTANT STAPHYLOCOCCUS AUREUS  Final      Susceptibility   Methicillin resistant staphylococcus aureus - MIC*    CIPROFLOXACIN >=8 RESISTANT Resistant     ERYTHROMYCIN >=8 RESISTANT Resistant     GENTAMICIN <=0.5 SENSITIVE Sensitive     OXACILLIN >=4 RESISTANT Resistant     TETRACYCLINE <=1 SENSITIVE Sensitive     VANCOMYCIN <=0.5 SENSITIVE Sensitive     TRIMETH/SULFA <=10 SENSITIVE Sensitive     CLINDAMYCIN RESISTANT Resistant     RIFAMPIN <=0.5 SENSITIVE Sensitive     Inducible Clindamycin  POSITIVE Resistant     * METHICILLIN RESISTANT STAPHYLOCOCCUS AUREUS  Aerobic Culture (superficial specimen)     Status: None (Preliminary result)   Collection Time: 02/17/19  1:31 PM   Specimen: Heel; Wound  Result Value Ref Range Status   Specimen Description   Final    HEEL Performed at [facility], [address], [place], [place] [address]    Special Requests   Final    NONE Performed at [facility], [address] [place], [place] [address]    Gram Stain   Final    NO WBC SEEN MODERATE GRAM POSITIVE COCCI IN PAIRS    Culture   Final    ABUNDANT STAPHYLOCOCCUS AUREUS CULTURE REINCUBATED FOR BETTER GROWTH Performed at [facility], [place] [address] [place], [place] [address]    Report Status PENDING  Incomplete         Radiology Studies: CT CERVICAL SPINE W CONTRAST  Result Date: 02/17/2019 CLINICAL DATA:  Persistent MRSA bacteremia EXAM: CT CERVICAL SPINE WITH CONTRAST TECHNIQUE: Multidetector CT imaging of the cervical spine was performed during intravenous contrast administration. Multiplanar CT image reconstructions were also generated. CONTRAST:  146mL OMNIPAQUE IOHEXOL 300 MG/ML  SOLN COMPARISON:  2019 MRI FINDINGS: Alignment: Preserved. Skull base and vertebrae: Vertebral body heights are maintained. There is no cortical destruction. Soft tissues and spinal canal: No prevertebral soft tissue swelling. No evidence of an epidural collection within limitation of artifact obscuring the spinal canal. Disc levels: Multilevel degenerative changes are present without high-grade canal or foraminal stenosis. Upper chest: Partially imaged pleural effusions. Other: None. IMPRESSION: No evidence of discitis/osteomyelitis or collection. Electronically Signed   By: Macy Mis M.D.   On: 02/17/2019 15:57   CT THORACIC SPINE W CONTRAST  Result Date: 02/17/2019 CLINICAL DATA:  MRSA bacteremia EXAM: CT THORACIC SPINE WITH CONTRAST TECHNIQUE:  Multidetector CT images of thoracic was performed according to the standard protocol following intravenous contrast administration. CONTRAST:  174mL OMNIPAQUE IOHEXOL 300 MG/ML  SOLN COMPARISON:  None. FINDINGS: Alignment: Preserved. Vertebrae: Vertebral body heights are maintained. There is no cortical destruction. Paraspinal and other soft tissues: Bilateral pleural effusions and bibasilar atelectasis/consolidation. Partially imaged cardiomegaly. Enlarged main pulmonary artery suggesting pulmonary arterial hypertension. Disc levels: Mild multilevel degenerative changes. There is no high-grade osseous encroachment on the spinal canal or neural foramina. IMPRESSION: No evidence of discitis/osteomyelitis in the thoracic spine. No collection. Bilateral pleural effusions and bibasilar atelectasis/consolidation. Electronically Signed   By: Macy Mis M.D.   On: 02/17/2019 16:17   CT LUMBAR SPINE W CONTRAST  Result Date: 02/17/2019 CLINICAL DATA:  MRSA bacteremia EXAM: CT  LUMBAR SPINE WITH CONTRAST TECHNIQUE: Multidetector CT imaging of the lumbar spine was performed with intravenous contrast administration. CONTRAST:  160mL OMNIPAQUE IOHEXOL 300 MG/ML  SOLN COMPARISON:  Via FINDINGS: Segmentation: 5 lumbar type vertebrae. Alignment: Normal. Vertebrae: Vertebral body heights are maintained. There is no cortical destruction. Paraspinal and other soft tissues: Mild presacral edema. No paraspinal collection. Disc levels: There is disc space narrowing and vacuum phenomenon at L5-S1. Multilevel facet hypertrophy is present. Left foraminal stenosis is present at L5-S1. No high-grade canal stenosis with suboptimal evaluation of the spinal canal due to artifact. IMPRESSION: No evidence of discitis/osteomyelitis in the lumbar spine. Nonspecific mild presacral edema. No paraspinal collection. Electronically Signed   By: Macy Mis M.D.   On: 02/17/2019 16:10   [place] RT UPPER EXTREM LTD SOFT TISSUE NON  VASCULAR  Result Date: 02/17/2019 CLINICAL DATA:  Evaluate for abscess around AV fistula. MRSA bacteremia. EXAM: ULTRASOUND RIGHT UPPER EXTREMITY LIMITED TECHNIQUE: Ultrasound examination of the upper extremity soft tissues was performed in the area of clinical concern. COMPARISON:  None. FINDINGS: Visualized portions of the AV fistula are patent. There is a 4.6 x 3.1 x 3.1 cm hypoechoic fluid collection surrounding the distal fistula. There is no internal vascularity or surrounding hyperemia. IMPRESSION: 1. 4.6 x 3.1 x 3.1 cm hypoechoic fluid collection surrounding the distal AV fistula. Appearance is more suggestive of a small hematoma given the lack of surrounding hyperemia, especially if this is a common site of fistula access. Superimposed infection is thought to be unlikely, but cannot be entirely excluded by imaging alone. Electronically Signed   By: Titus Dubin M.D.   On: 02/17/2019 07:41        Scheduled Meds: . aspirin  81 mg Per Tube Daily  . chlorhexidine  15 mL Mouth Rinse BID  . Chlorhexidine Gluconate Cloth  6 each Topical Q0600  . epoetin (EPOGEN/PROCRIT) injection  4,000 Units Intravenous Q M,W,F-HD  . feeding supplement (NEPRO CARB STEADY)  237 mL Oral TID BM  . FLUoxetine  20 mg Per Tube Daily  . fluticasone  2 spray Each Nare Daily  . insulin aspart  0-9 Units Subcutaneous TID WC  . levothyroxine  88 mcg Per Tube QAC breakfast  . mouth rinse  15 mL Mouth Rinse q12n4p  . midodrine  10 mg Per Tube TID WC  . multivitamin  1 tablet Oral QHS  . pantoprazole (PROTONIX) IV  40 mg Intravenous Q24H  . venlafaxine  75 mg Per Tube BID WC  . zinc sulfate  220 mg Per Tube Daily   Continuous Infusions: . sodium chloride 250 mL (02/10/19 0054)  . ceFTAROline (TEFLARO) IV 300 mg (02/17/19 2333)  . DAPTOmycin (CUBICIN)  IV    . heparin 1,100 Units/hr (02/18/19 0330)  . magnesium sulfate bolus IVPB       LOS: 9 days    Time spent: 86mins    Sidney Ace,  MD [facility] Pager: [phone]  If 7PM-7AM, please contact night-coverage [email or web address]  02/18/2019, 2:29 PM

## 2019-02-18 NOTE — Consult Note (Signed)
ANTICOAGULATION CONSULT NOTE  Pharmacy Consult for heparin drip  Indication: DVT   Patient Measurements: Height: 5\' 4"  (162.6 cm) Weight: 163 lb 5.8 oz (74.1 kg) IBW/kg (Calculated) : 54.7 Heparin Dosing Weight: 71 kg  Vital Signs: Temp: 98.2 F (36.8 C) (12/15 2300) Temp Source: Oral (12/15 2300) BP: 164/63 (12/15 2300) Pulse Rate: 67 (12/15 2300)  Labs: Recent Labs    02/15/19 0545 02/15/19 0805 02/15/19 1824 02/16/19 0433 02/17/19 0533 02/17/19 2011 02/18/19 0215  HGB 6.8*  --  8.8* 8.7*  --   --  9.4*  HCT 20.0*  --  25.0* 24.7*  --   --  28.4*  PLT 105*  --   --  126*  --   --  177  APTT  --  >160*  --   --   --  >160* 109*  LABPROT  --   --   --   --   --  15.6*  --   INR  --   --   --   --   --  1.3*  --   HEPARINUNFRC 0.30  --   --   --   --  0.68 0.29*  CREATININE 4.64*  --   --  5.30* 3.27*  --  3.95*  CKTOTAL  --   --   --  17*  --   --   --     Estimated Creatinine Clearance: 14 mL/min (A) (by C-G formula based on SCr of 3.95 mg/dL (H)).   Medical History: Past Medical History:  Diagnosis Date  . Anemia   . Chronic diastolic CHF (congestive heart failure) ([place])    a. Due to ischemic cardiomyopathy. EF as low as 35%, improved to normal s/p CABG; b. echo 07/06/13: EF 55-60%, no RWMAs, mod TR, trivial pericardial effusion not c/w tamponade physiology;  c. 10/2015 Echo: EF 65%, Gr1 DD, triv AI, mild MR, mildly dil LA, mod TR, PASP 98mmHg.  Marland Kitchen Coronary artery disease    a. NSTEMI 06/2013; b.cath: severe three-vessel CAD w/ EF 30% & mild-mod MR; c. s/p 3 vessel CABG 07/02/13 (LIMA-LAD, SVG-OM, and SVG-RPDA);  d. 10/2015 MV: no ischemia/infarct.  . Diabetes mellitus without complication ([place])   . Diabetic neuropathy ([place])   . Dialysis patient [facility])    MWF  . ESRD (end stage renal disease) ([place])    a. 12/2015 initiated - [facility] dialysis.  Marland Kitchen GERD (gastroesophageal reflux disease)   . Hyperlipidemia   . Hypertension   . Hypothyroidism   . Myocardial infarction  ([place]) 2015  . Neuropathy   . Pleural effusion 2015  . Pulmonary hypertension ([place])   . Renal insufficiency   . Wears dentures    full lower    Medications:  Scheduled:  . aspirin  81 mg Per Tube Daily  . chlorhexidine  15 mL Mouth Rinse BID  . Chlorhexidine Gluconate Cloth  6 each Topical Q0600  . epoetin (EPOGEN/PROCRIT) injection  4,000 Units Intravenous Q M,W,F-HD  . feeding supplement (NEPRO CARB STEADY)  237 mL Oral TID BM  . FLUoxetine  20 mg Per Tube Daily  . fluticasone  2 spray Each Nare Daily  . insulin aspart  0-9 Units Subcutaneous TID WC  . levothyroxine  88 mcg Per Tube QAC breakfast  . mouth rinse  15 mL Mouth Rinse q12n4p  . midodrine  10 mg Per Tube TID WC  . multivitamin  1 tablet Oral QHS  . pantoprazole (PROTONIX) IV  40 mg  Intravenous Q24H  . venlafaxine  75 mg Per Tube BID WC  . zinc sulfate  220 mg Per Tube Daily    Assessment: 63 y.o. female admitted on 02/09/2019 with MRSA  bacteremia. She was on apixaban PTA but last dose was prior to admission (12/7). US reveals age-indeterminate short segment occlusive DVT involving the mid humeral aspect of one of the paired brachial veins.  Heparin was recently held by MD on 12/12 after nurse noticed oozing during lab draw.  Heparin is now restarted, as TEE is tentative.  Current hemoglobin is 8.7 and platelets are 126 -- both increasing.  Goal of Therapy:  Heparin level 0.3-0.7 units/ml aPTT 66 - 102s Monitor platelets by anticoagulation protocol: Yes   Plan:  12/16 @ 0200 aPTT 109 seconds slightly supratherapeutic w/ HL 0.29 subtherapeutic not correlating w/ aPTT. Will decrease rate to 1100 units/hr and will recheck aPTT at 0800 w/ HL check w/ am labs to assess correlation, CBC low stable will continue to monitor.  Tobie Lords, PharmD 02/18/2019,3:17 AM

## 2019-02-18 NOTE — Progress Notes (Signed)
[place], [place] 02/18/19  Subjective:  Patient due for hemodialysis today. Resting comfortably in bed at the moment.  Objective:  Vital signs in last 24 hours:  Temp:  [97.6 F (36.4 C)-98.9 F (37.2 C)] 97.6 F (36.4 C) (12/16 0413) Pulse Rate:  [65-76] 70 (12/16 0413) Resp:  [10-20] 20 (12/16 0413) BP: (114-169)/(49-91) 169/72 (12/16 0413) SpO2:  [93 %-100 %] 95 % (12/16 0413) Weight:  [74.1 kg] 74.1 kg (12/15 2302)  Weight change:  Filed Weights   02/12/19 1130 02/12/19 1445 02/17/19 2302  Weight: 78 kg 78.2 kg 74.1 kg    Intake/Output:    Intake/Output Summary (Last 24 hours) at 02/18/2019 0949 Last data filed at 02/18/2019 0330 Gross per 24 hour  Intake 463.43 ml  Output --  Net 463.43 ml     Physical Exam: General:  Critically ill-appearing, lying in the bed  HEENT  [place]/AT, hearing intact  Pulm/lungs  clear bilateral, normal effort  CVS/Heart  S1S2 no rubs  Abdomen:   Soft, nontender  Extremities:  Trace bilateral lower extremity edema  Neurologic:  Awake, alert, follows commands  Skin:  Warm  Access:  AV fistula, right arm       Basic Metabolic Panel:  Recent Labs  Lab 02/13/19 0453 02/14/19 0340 02/15/19 0545 02/16/19 0433 02/17/19 0533 02/18/19 0215  NA 143 142 138 139 139 137  K 3.2* 3.1* 3.6 3.4* 4.0 4.0  CL 103 104 102 102 102 100  CO2 27 26 24 24 25 23   GLUCOSE 108* 109* 96 166* 101* 128*  BUN 17 23 26* 30* 14 18  CREATININE 3.31* 4.03* 4.64* 5.30* 3.27* 3.95*  CALCIUM 8.4* 8.0* 7.9* 8.2* 8.1* 8.0*  MG 2.3 2.2 2.2 1.9  --  1.7  PHOS 1.7* 2.2* 2.2* 2.5 1.8* 2.7     CBC: Recent Labs  Lab 02/13/19 0453 02/14/19 0340 02/15/19 0545 02/15/19 1824 02/16/19 0433 02/18/19 0215  WBC 6.9 8.0 8.9  --  8.9 10.6*  HGB 7.9* 7.2* 6.8* 8.8* 8.7* 9.4*  HCT 24.7* 22.2* 20.0* 25.0* 24.7* 28.4*  MCV 86.1 83.8 80.3  --  79.7* 84.3  PLT 107* 106* 105*  --  126* 177      Lab Results  Component Value Date    HEPBSAG NON REACTIVE 02/10/2019      Microbiology:  Recent Results (from the past 240 hour(s))  Culture, blood (routine x 2)     Status: Abnormal   Collection Time: 02/09/19  2:29 PM   Specimen: BLOOD LEFT ARM  Result Value Ref Range Status   Specimen Description   Final    BLOOD LEFT ARM Performed at [facility], [address] [place], [place] [address]    Special Requests   Final    BOTTLES DRAWN AEROBIC AND ANAEROBIC Blood Culture adequate volume Performed at [facility], [address] [place], [place] [address]    Culture  Setup Time   Final    GRAM POSITIVE COCCI IN BOTH AEROBIC AND ANAEROBIC BOTTLES CRITICAL VALUE NOTED.  VALUE IS CONSISTENT WITH PREVIOUSLY REPORTED AND CALLED VALUE. Performed at [facility], [address] [place], [place] [address]    Culture (A)  Final    STAPHYLOCOCCUS AUREUS SUSCEPTIBILITIES PERFORMED ON PREVIOUS CULTURE WITHIN THE LAST 5 DAYS. Performed at [facility], [place] [address] [place],  [address]    Report Status 02/12/2019 FINAL  Final  Culture, blood (routine x 2)  Status: Abnormal   Collection Time: 02/09/19  2:38 PM   Specimen: BLOOD LEFT ARM  Result Value Ref Range Status   Specimen Description   Final    BLOOD LEFT ARM Performed at [facility], [address], [place], [place] [address]    Special Requests   Final    BOTTLES DRAWN AEROBIC AND ANAEROBIC Blood Culture adequate volume Performed at [facility], [address] [place], [place] [address]    Culture  Setup Time   Final    GRAM POSITIVE COCCI IN BOTH AEROBIC AND ANAEROBIC BOTTLES CRITICAL RESULT CALLED TO, READ BACK BY AND VERIFIED WITH: [place] [id number] 02/10/2019 HNM Performed at [name], [place] [address], [place], [place] [address]    Culture METHICILLIN RESISTANT STAPHYLOCOCCUS AUREUS (A)  Final   Report Status 02/12/2019 FINAL  Final   Organism ID, Bacteria  METHICILLIN RESISTANT STAPHYLOCOCCUS AUREUS  Final      Susceptibility   Methicillin resistant staphylococcus aureus - MIC*    CIPROFLOXACIN >=8 RESISTANT Resistant     ERYTHROMYCIN >=8 RESISTANT Resistant     GENTAMICIN <=0.5 SENSITIVE Sensitive     OXACILLIN >=4 RESISTANT Resistant     TETRACYCLINE <=1 SENSITIVE Sensitive     VANCOMYCIN 1 SENSITIVE Sensitive     TRIMETH/SULFA <=10 SENSITIVE Sensitive     CLINDAMYCIN RESISTANT Resistant     RIFAMPIN <=0.5 SENSITIVE Sensitive     Inducible Clindamycin POSITIVE Resistant     * METHICILLIN RESISTANT STAPHYLOCOCCUS AUREUS  Blood Culture ID Panel (Reflexed)     Status: Abnormal   Collection Time: 02/09/19  2:38 PM  Result Value Ref Range Status   Enterococcus species NOT DETECTED NOT DETECTED Final   Listeria monocytogenes NOT DETECTED NOT DETECTED Final   Staphylococcus species DETECTED (A) NOT DETECTED Final    Comment: CRITICAL RESULT CALLED TO, READ BACK BY AND VERIFIED WITH: Hart Robinsons PHARMD [id number] 02/10/2019 HNM    Staphylococcus aureus (BCID) DETECTED (A) NOT DETECTED Final    Comment: Methicillin (oxacillin)-resistant Staphylococcus aureus (MRSA). MRSA is predictably resistant to beta-lactam antibiotics (except ceftaroline). Preferred therapy is vancomycin unless clinically contraindicated. Patient requires contact precautions if  hospitalized. CRITICAL RESULT CALLED TO, READ BACK BY AND VERIFIED WITH: Hart Robinsons PHARMD [id number] 02/10/2019 HNM    Methicillin resistance DETECTED (A) NOT DETECTED Final    Comment: CRITICAL RESULT CALLED TO, READ BACK BY AND VERIFIED WITH: Hart Robinsons PHARMD [id number] 02/10/2019 HNM    Streptococcus species NOT DETECTED NOT DETECTED Final   Streptococcus agalactiae NOT DETECTED NOT DETECTED Final   Streptococcus pneumoniae NOT DETECTED NOT DETECTED Final   Streptococcus pyogenes NOT DETECTED NOT DETECTED Final   Acinetobacter baumannii NOT DETECTED NOT DETECTED Final   Enterobacteriaceae species NOT DETECTED  NOT DETECTED Final   Enterobacter cloacae complex NOT DETECTED NOT DETECTED Final   Escherichia coli NOT DETECTED NOT DETECTED Final   Klebsiella oxytoca NOT DETECTED NOT DETECTED Final   Klebsiella pneumoniae NOT DETECTED NOT DETECTED Final   Proteus species NOT DETECTED NOT DETECTED Final   Serratia marcescens NOT DETECTED NOT DETECTED Final   Haemophilus influenzae NOT DETECTED NOT DETECTED Final   Neisseria meningitidis NOT DETECTED NOT DETECTED Final   Pseudomonas aeruginosa NOT DETECTED NOT DETECTED Final   Candida albicans NOT DETECTED NOT DETECTED Final   Candida glabrata NOT DETECTED NOT DETECTED Final   Candida krusei NOT DETECTED NOT DETECTED Final   Candida parapsilosis NOT DETECTED NOT DETECTED Final  Candida tropicalis NOT DETECTED NOT DETECTED Final    Comment: Performed at [facility], [place], [place] [address]  SARS CORONAVIRUS 2 (TAT 6-24 HRS) Nasopharyngeal Nasopharyngeal Swab     Status: None   Collection Time: 02/09/19  2:54 PM   Specimen: Nasopharyngeal Swab  Result Value Ref Range Status   SARS Coronavirus 2 NEGATIVE NEGATIVE Final    Comment: (NOTE) SARS-CoV-2 target nucleic acids are NOT DETECTED. The SARS-CoV-2 RNA is generally detectable in upper and lower respiratory specimens during the acute phase of infection. Negative results do not preclude SARS-CoV-2 infection, do not rule out co-infections with other pathogens, and should not be used as the sole basis for treatment or other patient management decisions. Negative results must be combined with clinical observations, patient history, and epidemiological information. The expected result is Negative. Fact Sheet for Patients: SugarRoll.be Fact Sheet for Healthcare Providers: https://www.woods-mathews.com/ This test is not yet approved or cleared by the [place] FDA and  has been authorized for detection and/or diagnosis of  SARS-CoV-2 by FDA under an Emergency Use Authorization (EUA). This EUA will remain  in effect (meaning this test can be used) for the duration of the COVID-19 declaration under Section 56 4(b)(1) of the Act, 21 U.S.C. section 360bbb-3(b)(1), unless the authorization is terminated or revoked sooner. Performed at [facility], [place] [address], [place], [place] [address]   MRSA PCR Screening     Status: Abnormal   Collection Time: 02/10/19  5:03 PM   Specimen: Nasopharyngeal  Result Value Ref Range Status   MRSA by PCR POSITIVE (A) NEGATIVE Final    Comment:        The GeneXpert MRSA Assay (FDA approved for NASAL specimens only), is one component of a comprehensive MRSA colonization surveillance program. It is not intended to diagnose MRSA infection nor to guide or monitor treatment for MRSA infections. RESULT CALLED TO, READ BACK BY AND VERIFIED WITH: Britt Boozer @1839  02/10/19 MJU Performed at [facility], [address] [place], [place] [address]   Culture, blood (routine x 2)     Status: Abnormal   Collection Time: 02/11/19 12:31 PM   Specimen: BLOOD  Result Value Ref Range Status   Specimen Description   Final    BLOOD PORTA CATH Performed at [place] [address] [place], [place] [address]    Special Requests   Final    BOTTLES DRAWN AEROBIC AND ANAEROBIC Blood Culture adequate volume Performed at [facility], [address] [place], [place] [address]    Culture  Setup Time   Final    GRAM POSITIVE COCCI ANAEROBIC BOTTLE ONLY CRITICAL RESULT CALLED TO, READ BACK BY AND VERIFIED WITH: SCOTT HALL AT [id number] 02/12/2019 [facility]  Performed at [facility], [address] [place], [place] [address]    Culture (A)  Final    STAPHYLOCOCCUS AUREUS SUSCEPTIBILITIES PERFORMED ON PREVIOUS CULTURE WITHIN THE LAST 5 DAYS. Performed at [facility], [place] [address], [place],  [address]    Report Status 02/13/2019  FINAL  Final  Culture, blood (routine x 2)     Status: None   Collection Time: 02/11/19  3:57 PM   Specimen: BLOOD  Result Value Ref Range Status   Specimen Description BLOOD PORTA CATH  Final   Special Requests   Final    BOTTLES DRAWN AEROBIC AND ANAEROBIC Blood Culture adequate volume   Culture   Final  NO GROWTH 5 DAYS Performed at [facility], [address] [place], [place] [address]    Report Status 02/16/2019 FINAL  Final  CULTURE, BLOOD (ROUTINE X 2) w Reflex to ID Panel     Status: Abnormal   Collection Time: 02/13/19  2:18 PM   Specimen: BLOOD LEFT HAND  Result Value Ref Range Status   Specimen Description BLOOD LEFT HAND  Final   Special Requests   Final    BOTTLES DRAWN AEROBIC AND ANAEROBIC Blood Culture results may not be optimal due to an inadequate volume of blood received in culture bottles   Culture  Setup Time   Final    AEROBIC BOTTLE ONLY GRAM POSITIVE COCCI IN CLUSTERS CRITICAL RESULT CALLED TO, READ BACK BY AND VERIFIED WITH: Herbert Pun AT [id number] ON 02/14/2019 [place]. Performed at [facility], [place] [address], [place], [place] [address]    Culture METHICILLIN RESISTANT STAPHYLOCOCCUS AUREUS (A)  Final   Report Status 02/16/2019 FINAL  Final   Organism ID, Bacteria METHICILLIN RESISTANT STAPHYLOCOCCUS AUREUS  Final      Susceptibility   Methicillin resistant staphylococcus aureus - MIC*    CIPROFLOXACIN >=8 RESISTANT Resistant     ERYTHROMYCIN >=8 RESISTANT Resistant     GENTAMICIN <=0.5 SENSITIVE Sensitive     OXACILLIN >=4 RESISTANT Resistant     TETRACYCLINE <=1 SENSITIVE Sensitive     VANCOMYCIN <=0.5 SENSITIVE Sensitive     TRIMETH/SULFA <=10 SENSITIVE Sensitive     CLINDAMYCIN RESISTANT Resistant     RIFAMPIN <=0.5 SENSITIVE Sensitive     Inducible Clindamycin POSITIVE Resistant     * METHICILLIN RESISTANT STAPHYLOCOCCUS AUREUS  Blood Culture ID Panel (Reflexed)     Status: Abnormal   Collection Time: 02/13/19  2:18 PM   Result Value Ref Range Status   Enterococcus species NOT DETECTED NOT DETECTED Final   Listeria monocytogenes NOT DETECTED NOT DETECTED Final   Staphylococcus species DETECTED (A) NOT DETECTED Final    Comment: CRITICAL RESULT CALLED TO, READ BACK BY AND VERIFIED WITH: ABBEY ELLINGTON AT [id number] ON 02/14/2019 [place].    Staphylococcus aureus (BCID) DETECTED (A) NOT DETECTED Final    Comment: Methicillin (oxacillin)-resistant Staphylococcus aureus (MRSA). MRSA is predictably resistant to beta-lactam antibiotics (except ceftaroline). Preferred therapy is vancomycin unless clinically contraindicated. Patient requires contact precautions if  hospitalized. CRITICAL RESULT CALLED TO, READ BACK BY AND VERIFIED WITH: Herbert Pun AT [id number] ON 02/14/2019 [place].    Methicillin resistance DETECTED (A) NOT DETECTED Final    Comment: CRITICAL RESULT CALLED TO, READ BACK BY AND VERIFIED WITH: Herbert Pun AT [id number] ON 02/14/2019 [place].    Streptococcus species NOT DETECTED NOT DETECTED Final   Streptococcus agalactiae NOT DETECTED NOT DETECTED Final   Streptococcus pneumoniae NOT DETECTED NOT DETECTED Final   Streptococcus pyogenes NOT DETECTED NOT DETECTED Final   Acinetobacter baumannii NOT DETECTED NOT DETECTED Final   Enterobacteriaceae species NOT DETECTED NOT DETECTED Final   Enterobacter cloacae complex NOT DETECTED NOT DETECTED Final   Escherichia coli NOT DETECTED NOT DETECTED Final   Klebsiella oxytoca NOT DETECTED NOT DETECTED Final   Klebsiella pneumoniae NOT DETECTED NOT DETECTED Final   Proteus species NOT DETECTED NOT DETECTED Final   Serratia marcescens NOT DETECTED NOT DETECTED Final   Haemophilus influenzae NOT DETECTED NOT DETECTED Final   Neisseria meningitidis NOT DETECTED NOT DETECTED Final   Pseudomonas aeruginosa NOT DETECTED NOT DETECTED Final   Candida albicans NOT DETECTED NOT DETECTED Final   Candida  glabrata NOT DETECTED NOT DETECTED Final   Candida krusei NOT DETECTED NOT  DETECTED Final   Candida parapsilosis NOT DETECTED NOT DETECTED Final   Candida tropicalis NOT DETECTED NOT DETECTED Final    Comment: Performed at [facility], [address] [place], [place] [address]  CULTURE, BLOOD (ROUTINE X 2) w Reflex to ID Panel     Status: None   Collection Time: 02/13/19  3:09 PM   Specimen: BLOOD  Result Value Ref Range Status   Specimen Description BLOOD BLOOD LEFT HAND  Final   Special Requests   Final    BOTTLES DRAWN AEROBIC AND ANAEROBIC Blood Culture adequate volume   Culture   Final    NO GROWTH 5 DAYS Performed at [facility], [address], [place], [place] [address]    Report Status 02/18/2019 FINAL  Final  Culture, blood (routine x 2)     Status: Abnormal   Collection Time: 02/15/19 12:49 PM   Specimen: BLOOD  Result Value Ref Range Status   Specimen Description   Final    BLOOD LT HAND Performed at [facility], [address] [place], [place] [address]    Special Requests   Final    BOTTLES DRAWN AEROBIC AND ANAEROBIC Blood Culture results may not be optimal due to an inadequate volume of blood received in culture bottles Performed at [facility], [address] [place], [place] [address]    Culture  Setup Time   Final    GRAM POSITIVE COCCI IN BOTH AEROBIC AND ANAEROBIC BOTTLES CRITICAL RESULT CALLED TO, READ BACK BY AND VERIFIED WITHEleonore Chiquito AT [id number] 02/16/2019 [place] Performed at [facility], [address] [place], [place] [address]    Culture (A)  Final    METHICILLIN RESISTANT STAPHYLOCOCCUS AUREUS STAPHYLOCOCCUS SPECIES (COAGULASE NEGATIVE) THE SIGNIFICANCE OF ISOLATING THIS ORGANISM FROM A SINGLE SET OF BLOOD CULTURES WHEN MULTIPLE SETS ARE DRAWN IS UNCERTAIN. PLEASE NOTIFY THE MICROBIOLOGY DEPARTMENT WITHIN ONE WEEK IF SPECIATION AND SENSITIVITIES ARE REQUIRED. Performed at [name], [place] [address], [place], [place] [address]    Report Status 02/18/2019  FINAL  Final   Organism ID, Bacteria METHICILLIN RESISTANT STAPHYLOCOCCUS AUREUS  Final      Susceptibility   Methicillin resistant staphylococcus aureus - MIC*    CIPROFLOXACIN >=8 RESISTANT Resistant     ERYTHROMYCIN >=8 RESISTANT Resistant     GENTAMICIN <=0.5 SENSITIVE Sensitive     OXACILLIN >=4 RESISTANT Resistant     TETRACYCLINE <=1 SENSITIVE Sensitive     VANCOMYCIN <=0.5 SENSITIVE Sensitive     TRIMETH/SULFA <=10 SENSITIVE Sensitive     CLINDAMYCIN RESISTANT Resistant     RIFAMPIN <=0.5 SENSITIVE Sensitive     Inducible Clindamycin POSITIVE Resistant     * METHICILLIN RESISTANT STAPHYLOCOCCUS AUREUS  Aerobic Culture (superficial specimen)     Status: None (Preliminary result)   Collection Time: 02/17/19  1:31 PM   Specimen: Heel; Wound  Result Value Ref Range Status   Specimen Description   Final    HEEL Performed at [facility], [address], [place], [place] [address]    Special Requests   Final    NONE Performed at [facility], [address] [place], [place] [address]    Gram Stain   Final    NO WBC SEEN MODERATE GRAM POSITIVE COCCI IN PAIRS Performed at [facility], [place] [address] [place],  [address]  Culture PENDING  Incomplete   Report Status PENDING  Incomplete    Coagulation Studies: Recent Labs    02/17/19 06/12/2009  LABPROT 15.6*  INR 1.3*    Urinalysis: No results for input(s): COLORURINE, LABSPEC, PHURINE, GLUCOSEU, HGBUR, BILIRUBINUR, KETONESUR, PROTEINUR, UROBILINOGEN, NITRITE, LEUKOCYTESUR in the last 72 hours.  Invalid input(s): APPERANCEUR    Imaging: CT CERVICAL SPINE W CONTRAST  Result Date: 02/17/2019 CLINICAL DATA:  Persistent MRSA bacteremia EXAM: CT CERVICAL SPINE WITH CONTRAST TECHNIQUE: Multidetector CT imaging of the cervical spine was performed during intravenous contrast administration. Multiplanar CT image reconstructions were also generated. CONTRAST:  157mL OMNIPAQUE IOHEXOL 300 MG/ML   SOLN COMPARISON:  12-Jun-2017 MRI FINDINGS: Alignment: Preserved. Skull base and vertebrae: Vertebral body heights are maintained. There is no cortical destruction. Soft tissues and spinal canal: No prevertebral soft tissue swelling. No evidence of an epidural collection within limitation of artifact obscuring the spinal canal. Disc levels: Multilevel degenerative changes are present without high-grade canal or foraminal stenosis. Upper chest: Partially imaged pleural effusions. Other: None. IMPRESSION: No evidence of discitis/osteomyelitis or collection. Electronically Signed   By: Macy Mis M.D.   On: 02/17/2019 15:57   CT THORACIC SPINE W CONTRAST  Result Date: 02/17/2019 CLINICAL DATA:  MRSA bacteremia EXAM: CT THORACIC SPINE WITH CONTRAST TECHNIQUE: Multidetector CT images of thoracic was performed according to the standard protocol following intravenous contrast administration. CONTRAST:  177mL OMNIPAQUE IOHEXOL 300 MG/ML  SOLN COMPARISON:  None. FINDINGS: Alignment: Preserved. Vertebrae: Vertebral body heights are maintained. There is no cortical destruction. Paraspinal and other soft tissues: Bilateral pleural effusions and bibasilar atelectasis/consolidation. Partially imaged cardiomegaly. Enlarged main pulmonary artery suggesting pulmonary arterial hypertension. Disc levels: Mild multilevel degenerative changes. There is no high-grade osseous encroachment on the spinal canal or neural foramina. IMPRESSION: No evidence of discitis/osteomyelitis in the thoracic spine. No collection. Bilateral pleural effusions and bibasilar atelectasis/consolidation. Electronically Signed   By: Macy Mis M.D.   On: 02/17/2019 16:17   CT LUMBAR SPINE W CONTRAST  Result Date: 02/17/2019 CLINICAL DATA:  MRSA bacteremia EXAM: CT LUMBAR SPINE WITH CONTRAST TECHNIQUE: Multidetector CT imaging of the lumbar spine was performed with intravenous contrast administration. CONTRAST:  157mL OMNIPAQUE IOHEXOL 300 MG/ML  SOLN  COMPARISON:  Via FINDINGS: Segmentation: 5 lumbar type vertebrae. Alignment: Normal. Vertebrae: Vertebral body heights are maintained. There is no cortical destruction. Paraspinal and other soft tissues: Mild presacral edema. No paraspinal collection. Disc levels: There is disc space narrowing and vacuum phenomenon at L5-S1. Multilevel facet hypertrophy is present. Left foraminal stenosis is present at L5-S1. No high-grade canal stenosis with suboptimal evaluation of the spinal canal due to artifact. IMPRESSION: No evidence of discitis/osteomyelitis in the lumbar spine. Nonspecific mild presacral edema. No paraspinal collection. Electronically Signed   By: Macy Mis M.D.   On: 02/17/2019 16:10   [place] RT UPPER EXTREM LTD SOFT TISSUE NON VASCULAR  Result Date: 02/17/2019 CLINICAL DATA:  Evaluate for abscess around AV fistula. MRSA bacteremia. EXAM: ULTRASOUND RIGHT UPPER EXTREMITY LIMITED TECHNIQUE: Ultrasound examination of the upper extremity soft tissues was performed in the area of clinical concern. COMPARISON:  None. FINDINGS: Visualized portions of the AV fistula are patent. There is a 4.6 x 3.1 x 3.1 cm hypoechoic fluid collection surrounding the distal fistula. There is no internal vascularity or surrounding hyperemia. IMPRESSION: 1. 4.6 x 3.1 x 3.1 cm hypoechoic fluid collection surrounding the distal AV fistula. Appearance is more suggestive of a small hematoma given the lack of surrounding hyperemia, especially  if this is a common site of fistula access. Superimposed infection is thought to be unlikely, but cannot be entirely excluded by imaging alone. Electronically Signed   By: Titus Dubin M.D.   On: 02/17/2019 07:41     Medications:   . sodium chloride 250 mL (02/10/19 0054)  . ceFTAROline (TEFLARO) IV 300 mg (02/17/19 2333)  . DAPTOmycin (CUBICIN)  IV    . heparin 1,100 Units/hr (02/18/19 0330)   . aspirin  81 mg Per Tube Daily  . chlorhexidine  15 mL Mouth Rinse BID  .  Chlorhexidine Gluconate Cloth  6 each Topical Q0600  . epoetin (EPOGEN/PROCRIT) injection  4,000 Units Intravenous Q M,W,F-HD  . feeding supplement (NEPRO CARB STEADY)  237 mL Oral TID BM  . FLUoxetine  20 mg Per Tube Daily  . fluticasone  2 spray Each Nare Daily  . insulin aspart  0-9 Units Subcutaneous TID WC  . levothyroxine  88 mcg Per Tube QAC breakfast  . mouth rinse  15 mL Mouth Rinse q12n4p  . midodrine  10 mg Per Tube TID WC  . multivitamin  1 tablet Oral QHS  . pantoprazole (PROTONIX) IV  40 mg Intravenous Q24H  . venlafaxine  75 mg Per Tube BID WC  . zinc sulfate  220 mg Per Tube Daily   sodium chloride, ondansetron (ZOFRAN) IV  Assessment/ Plan:  64 y.o.caucasian female with end-stage renal disease, atrial fibrillation, chronic diastolic congestive heart failure, CAD status post PCI, CABG,  type 2 diabetes, hypertension, hyperlipidemia, hypothyroidism, Covid positive in July 2020, recent nursing home stay in [place]  NEPHROLOGIST: Lavonia Dana MD  LOCATION: [place]: M-W-F 2nd Shift  EDW: 81.5 kg.  KIDNEY: [place] Optiflux 180NR  LITERS PROC: liters/treatment  HD TIME: 225  ACCESS: R Upper Arm AVF  NEEDLE SIZE:  ANTICOAG: Custom Heparin 2000  BATH: 2K/2.5Ca  QB: 400 ml/min  QD: 600 ml/min   1.  End-stage renal disease -Patient due for hemodialysis today.  Orders have been prepared.  2.  Anemia of chronic kidney disease anemia of chronic kidney disease Lab Results  Component Value Date   HGB 9.4 (L) 02/18/2019  -Maintain the patient on Epogen 4000 IV with dialysis.  3. SHPTH -check serum phosphorus today.  4. Altered mental status -Remains awake alert, and conversant.  5. Sepsis - blood culture positive for MRSA from 02/09/2019  ? Source; ID team following Patient started on ceftaroline and on Cubicin. Ultrasound of right upper extremity appears more consistent with hematoma rather than infection however infection not totally  excluded.  CT of the C/T/L-spine negative for osteomyelitis.  6. Hypokalemia Potassium stable at 4.0.      LOS: 9 Kaylani Fromme 12/16/20209:49 AM  [place], [place]  Note: This note was prepared with Dragon dictation. Any transcription errors are unintentional

## 2019-02-18 NOTE — Progress Notes (Signed)
Pre HD Tx Assessment    02/18/19 1050  Neurological  Level of Consciousness Alert  Orientation Level Disoriented to place;Disoriented to time;Disoriented to situation  Respiratory  Respiratory Pattern Regular;Unlabored  Chest Assessment Chest expansion symmetrical  Bilateral Breath Sounds Diminished  Cardiac  Pulse Irregular  Cardiac Rhythm NSR  Heart Block Type 1st degree AVB  Ectopy PAC  Antiarrhythmic device No  Vascular  R Radial Pulse +2  L Radial Pulse +2  R Dorsalis Pedis Pulse +1  L Dorsalis Pedis Pulse +1  Edema Generalized;Right upper extremity;Left upper extremity  Generalized Edema +2  RUE Edema +3  LUE Edema +3  RLE Edema +1  LLE Edema +1  Integumentary  Integumentary (WDL) X  Skin Color Appropriate for ethnicity  Skin Condition Dry  Skin Integrity Abrasion;Ecchymosis;Skin tear;Weeping  Ecchymosis Location Arm;Neck  Ecchymosis Location Orientation Bilateral  Weeping Location Arm  Weeping Location Orientation Left  Musculoskeletal  Musculoskeletal (WDL) X  Generalized Weakness Yes  Gastrointestinal  Bowel Sounds Assessment Active  Last BM Date 02/17/19  GU Assessment  Genitourinary (WDL) X  Genitourinary Symptoms Oliguria  Psychosocial  Psychosocial (WDL) X  Patient Behaviors Flat affect  Needs Expressed Physical  Psychosocial Additional Assessments Family behavior  Family Behavior Supportive;Calm;Cooperative  Emotional support given Given to patient;Given to patient's family

## 2019-02-18 NOTE — Evaluation (Addendum)
Occupational Therapy Evaluation Patient Details Name: Jamie Arias MRN: [id number] DOB: 1955-07-19 Today's Date: 02/18/2019    History of Present Illness Pt admitted for sepsis secondary to MRSA with complaints of abdominal pain and AMS. PMH includes ESRD on HD MWF, anemia, CHF, CAD s/p PCI, DM, and HTN. Pt contracted covid in July 2020 and has been dealing with chronic weakness. Hospital stay complicated by rapid response and transfer to CCU on 12/8 secondary to hypotension. Pt is poor historian and most of history obtained via medical record and documented conversations with pt husband.   Clinical Impression   Pt. presents with weakness, limited ROM, limited activity tolerance, Impaired cognition, and limited functional mobility which limits the ability to complete basic ADL and IADL functioning. Pt. resides at home with her husband. Pt. required assist with ADL, and IADL tasks from. Pt. required assist from her husband with all ADLs, and IADL tasks. Pt. was total assist to attempt self- feeding with hand-over-hand assist. Pt. presented with poor task initiation, and poor initiation of motor movements. Pt. was able to respond to simple questions, and required Total assist for repositioning to midline secondary to leaning to the right. Pt. could benefit from OT services for ADL training, A/E training, UE there. Ex, cognitive compensatory strategies, and pt./caregiver education about home modification, and DME. Pt. would benefit from SNF level of care with follow-up OT services at discharge.    Follow Up Recommendations  SNF    Equipment Recommendations       Recommendations for Other Services       Precautions / Restrictions Precautions Precautions: Fall Restrictions Weight Bearing Restrictions: No      Mobility Bed Mobility Overal bed mobility: Needs Assistance             General bed mobility comments: Total Assist  Transfers                 General transfer  comment: Total Assist    Balance                                           ADL either performed or assessed with clinical judgement   ADL Overall ADL's : Needs assistance/impaired Eating/Feeding: Total assistance   Grooming: Total assistance   Upper Body Bathing: Total assistance   Lower Body Bathing: Total assistance   Upper Body Dressing : Total assistance   Lower Body Dressing: Total assistance   Toilet Transfer: Total assistance           Functional mobility during ADLs: Total assistance       Vision Baseline Vision/History: (Unable to assess)       Perception     Praxis      Pertinent Vitals/Pain Pain Assessment: No/denies pain     Hand Dominance Left   Extremity/Trunk Assessment Upper Extremity Assessment Upper Extremity Assessment: Generalized weakness       Communication Communication Communication: No difficulties   Cognition Arousal/Alertness: Awake/alert Behavior During Therapy: WFL for tasks assessed/performed Overall Cognitive Status: No family/caregiver present to determine baseline cognitive functioning                                 General Comments: Pt. is able to answer simple questions, however presents with poor initation poor initiation, and initiation with motor movements.  General Comments       Exercises   Shoulder Instructions      Home Living Family/patient expects to be discharged to:: Private residence Living Arrangements: Spouse/significant other Available Help at Discharge: Family;Available 24 hours/day;Other (Comment) Type of Home: Mobile home Home Access: Stairs to enter Entrance Stairs-Number of Steps: 8 Entrance Stairs-Rails: Right Home Layout: One level     Bathroom Shower/Tub: Occupational psychologist: Standard     Home Equipment: Environmental consultant - 2 wheels;Wheelchair - manual;Bedside commode          Prior Functioning/Environment Level of Independence: Needs  assistance    ADL's / Homemaking Assistance Needed: Pt. required assist from her husband with all ADLs, and IADLs   Comments: Pt is poor historian.        OT Problem List: Decreased strength;Decreased cognition;Impaired balance (sitting and/or standing);Decreased knowledge of precautions;Decreased safety awareness;Decreased range of motion;Decreased activity tolerance;Decreased knowledge of use of DME or AE;Impaired UE functional use;Decreased coordination      OT Treatment/Interventions: Self-care/ADL training;Therapeutic exercise;Patient/family education;DME and/or AE instruction;Therapeutic activities    OT Goals(Current goals can be found in the care plan section) Acute Rehab OT Goals Patient Stated Goal: unable to state  OT Frequency: Min 1X/week   Barriers to D/C:            Co-evaluation              AM-PAC OT "6 Clicks" Daily Activity     Outcome Measure Help from another person eating meals?: Total Help from another person taking care of personal grooming?: Total Help from another person toileting, which includes using toliet, bedpan, or urinal?: Total Help from another person bathing (including washing, rinsing, drying)?: Total Help from another person to put on and taking off regular upper body clothing?: Total Help from another person to put on and taking off regular lower body clothing?: Total 6 Click Score: 6   End of Session    Activity Tolerance:   Patient left: in bed  OT Visit Diagnosis: Muscle weakness (generalized) (M62.81)                Time: [id number] OT Time Calculation (min): 17 min Charges:  OT General Charges $OT Visit: 1 Visit OT Evaluation $OT Eval Low Complexity: 1 Low  Harrel Carina, MS, OTR/L  Harrel Carina 02/18/2019, 11:29 AM

## 2019-02-19 DIAGNOSIS — Z515 Encounter for palliative care: Secondary | ICD-10-CM

## 2019-02-19 DIAGNOSIS — L89629 Pressure ulcer of left heel, unspecified stage: Secondary | ICD-10-CM

## 2019-02-19 DIAGNOSIS — Z7189 Other specified counseling: Secondary | ICD-10-CM

## 2019-02-19 LAB — RENAL FUNCTION PANEL
Albumin: 2.2 g/dL — ABNORMAL LOW (ref 3.5–5.0)
Anion gap: 14 (ref 5–15)
BUN: 11 mg/dL (ref 8–23)
CO2: 28 mmol/L (ref 22–32)
Calcium: 8.4 mg/dL — ABNORMAL LOW (ref 8.9–10.3)
Chloride: 100 mmol/L (ref 98–111)
Creatinine, Ser: 3.29 mg/dL — ABNORMAL HIGH (ref 0.44–1.00)
GFR calc Af Amer: 16 mL/min — ABNORMAL LOW (ref >60)
GFR calc non Af Amer: 14 mL/min — ABNORMAL LOW (ref >60)
Glucose, Bld: 119 mg/dL — ABNORMAL HIGH (ref 70–99)
Phosphorus: 2.9 mg/dL (ref 2.5–4.6)
Potassium: 3 mmol/L — ABNORMAL LOW (ref 3.5–5.1)
Sodium: 142 mmol/L (ref 135–145)

## 2019-02-19 LAB — AEROBIC CULTURE W GRAM STAIN (SUPERFICIAL SPECIMEN): Gram Stain: NONE SEEN

## 2019-02-19 LAB — CBC
HCT: 25.6 % — ABNORMAL LOW (ref 36.0–46.0)
Hemoglobin: 8.7 g/dL — ABNORMAL LOW (ref 12.0–15.0)
MCH: 28.2 pg (ref 26.0–34.0)
MCHC: 34 g/dL (ref 30.0–36.0)
MCV: 83.1 fL (ref 80.0–100.0)
Platelets: 218 10*3/uL (ref 150–400)
RBC: 3.08 MIL/uL — ABNORMAL LOW (ref 3.87–5.11)
RDW: 19.4 % — ABNORMAL HIGH (ref 11.5–15.5)
WBC: 15.9 10*3/uL — ABNORMAL HIGH (ref 4.0–10.5)
nRBC: 0.2 % (ref 0.0–0.2)

## 2019-02-19 LAB — GLUCOSE, CAPILLARY
Glucose-Capillary: 171 mg/dL — ABNORMAL HIGH (ref 70–99)
Glucose-Capillary: 152 mg/dL — ABNORMAL HIGH (ref 70–99)
Glucose-Capillary: 147 mg/dL — ABNORMAL HIGH (ref 70–99)
Glucose-Capillary: 111 mg/dL — ABNORMAL HIGH (ref 70–99)

## 2019-02-19 LAB — AMMONIA: Ammonia: 12 umol/L (ref 9–35)

## 2019-02-19 LAB — APTT: aPTT: >160 s (ref 24–36)

## 2019-02-19 LAB — MAGNESIUM: Magnesium: 1.9 mg/dL (ref 1.7–2.4)

## 2019-02-19 MED ORDER — APIXABAN 2.5 MG PO TABS: 2.5000 mg | ORAL_TABLET | Freq: Two times a day (BID) | ORAL | Status: DC

## 2019-02-19 MED ORDER — APIXABAN 5 MG PO TABS: 10.0000 mg | ORAL_TABLET | Freq: Two times a day (BID) | ORAL | Status: DC

## 2019-02-19 MED ORDER — HALOPERIDOL LACTATE 5 MG/ML IJ SOLN
1.0000 mg | Freq: Four times a day (QID) | INTRAMUSCULAR | Status: DC | PRN
  Filled 2019-02-19: qty 1

## 2019-02-19 MED ORDER — HEPARIN (PORCINE) 25000 UT/250ML-% IV SOLN
900.0000 [IU]/h | INTRAVENOUS | Status: DC
  Administered 2019-02-19: 900 [IU]/h via INTRAVENOUS

## 2019-02-19 MED ORDER — POTASSIUM CHLORIDE 10 MEQ/100ML IV SOLN
10.0000 meq | INTRAVENOUS | Status: AC
  Administered 2019-02-19 – 2019-02-20 (×4): 10 meq via INTRAVENOUS
  Filled 2019-02-19 (×3): qty 100

## 2019-02-19 NOTE — Consult Note (Addendum)
ANTICOAGULATION CONSULT NOTE  Pharmacy Consult for heparin drip  Indication: DVT   Patient Measurements: Height: 5\' 4"  (162.6 cm) Weight: 165 lb 2 oz (74.9 kg) IBW/kg (Calculated) : 54.7 Heparin Dosing Weight: 71 kg  Vital Signs: Temp: 98.9 F (37.2 C) (12/16 2045) Temp Source: Oral (12/16 2045) BP: 147/59 (12/16 2045) Pulse Rate: 64 (12/16 2045)  Labs: Recent Labs    02/16/19 0433 02/17/19 0533 02/17/19 2011 02/18/19 0215 02/18/19 2329  HGB 8.7*  --   --  9.4*  --   HCT 24.7*  --   --  28.4*  --   PLT 126*  --   --  177  --   APTT  --   --  >160* 109* >160*  LABPROT  --   --  15.6*  --   --   INR  --   --  1.3*  --   --   HEPARINUNFRC  --   --  0.68 0.29*  --   CREATININE 5.30* 3.27*  --  3.95*  --   CKTOTAL 17*  --   --   --   --     Estimated Creatinine Clearance: 14.1 mL/min (A) (by C-G formula based on SCr of 3.95 mg/dL (H)).   Medical History: Past Medical History:  Diagnosis Date  . Anemia   . Chronic diastolic CHF (congestive heart failure) ([place])    a. Due to ischemic cardiomyopathy. EF as low as 35%, improved to normal s/p CABG; b. echo 07/06/13: EF 55-60%, no RWMAs, mod TR, trivial pericardial effusion not c/w tamponade physiology;  c. 10/2015 Echo: EF 65%, Gr1 DD, triv AI, mild MR, mildly dil LA, mod TR, PASP 28mmHg.  Marland Kitchen Coronary artery disease    a. NSTEMI 06/2013; b.cath: severe three-vessel CAD w/ EF 30% & mild-mod MR; c. s/p 3 vessel CABG 07/02/13 (LIMA-LAD, SVG-OM, and SVG-RPDA);  d. 10/2015 MV: no ischemia/infarct.  . Diabetes mellitus without complication ([place])   . Diabetic neuropathy ([place])   . Dialysis patient [facility])    MWF  . ESRD (end stage renal disease) ([place])    a. 12/2015 initiated - mwf dialysis.  Marland Kitchen GERD (gastroesophageal reflux disease)   . Hyperlipidemia   . Hypertension   . Hypothyroidism   . Myocardial infarction ([place]) 2015  . Neuropathy   . Pleural effusion 2015  . Pulmonary hypertension ([place])   . Renal insufficiency   . Wears  dentures    full lower    Medications:  Scheduled:  . aspirin  81 mg Per Tube Daily  . chlorhexidine  15 mL Mouth Rinse BID  . Chlorhexidine Gluconate Cloth  6 each Topical Q0600  . epoetin (EPOGEN/PROCRIT) injection  4,000 Units Intravenous Q M,W,F-HD  . feeding supplement (NEPRO CARB STEADY)  237 mL Oral TID BM  . FLUoxetine  20 mg Per Tube Daily  . fluticasone  2 spray Each Nare Daily  . insulin aspart  0-9 Units Subcutaneous TID WC  . levothyroxine  88 mcg Per Tube QAC breakfast  . mouth rinse  15 mL Mouth Rinse q12n4p  . midodrine  10 mg Per Tube TID WC  . multivitamin  1 tablet Oral QHS  . pantoprazole (PROTONIX) IV  40 mg Intravenous Q24H  . venlafaxine  75 mg Per Tube BID WC  . zinc sulfate  220 mg Per Tube Daily    Assessment: 63 y.o. female admitted on 02/09/2019 with MRSA  bacteremia. She was on apixaban PTA but last dose  was prior to admission (12/7). US reveals age-indeterminate short segment occlusive DVT involving the mid humeral aspect of one of the paired brachial veins.  Heparin was recently held by MD on 12/12 after nurse noticed oozing during lab draw.  Heparin is now restarted, as TEE is tentative.  Current hemoglobin is 8.7 and platelets are 126 -- both increasing.  12/16 @1824 : Patient was in dialysis today. Per nurse, Margarita Grizzle, heparin was stopped for ~ 3-5 hours (0952-1500 heparin stop orders placed) while in dialysis. Heparin was resumed around 1500. Therefore, will recheck aPTT levels based on when heparin was resumed @ 1100 units/hr   12/16 @ 2329 aPTT >160  Goal of Therapy:  Heparin level 0.3-0.7 units/ml aPTT 66 - 102s Monitor platelets by anticoagulation protocol: Yes   Plan:  12/16 @ 2329 aPTT >160. Will hold heparin infusion for 1 hour, then restart heparin infusion at reduced rate of 900 units/hr.  Will recheck  APTT and HL 6 hours after infusion rate change.  CBC with AM labs per protocol.   Pernell Dupre, PharmD, BCPS Clinical  Pharmacist 02/19/2019 12:20 AM

## 2019-02-19 NOTE — Progress Notes (Signed)
Patient has an order for cardiac monitoring but did not have one placed. Verified with Dr. Priscella Mann that he wanted to continue the monitoring

## 2019-02-19 NOTE — Progress Notes (Signed)
Patient's husband [name] would like to speak with the doctor. I notified Dr. Priscella Mann.

## 2019-02-19 NOTE — Progress Notes (Signed)
Asked Dr. Priscella Mann for something to calm the patient down as she is talking about something but me nor the husband can make out what she is saying. Dr. Priscella Mann ordered Haldol.

## 2019-02-19 NOTE — Progress Notes (Signed)
Chart reviewed. Pt was asleep but awakened for a limited time. Family member present and supportive. Attempted PO trials with no success. Pt positioned upright, opened eyes. Given ice chips placed to lips Pt would not open her mouth and grimaced with two different attempts. Family reports Pt drank some of the thickened liquids last night but refused any other PO's verbally stating she didn't want anything. Today, Pt did not verabaly communicate during attempted PO's/tx. Educated family on the importance of continuing the thickened liquids and the risk of aspiration until further assessment by ST and improved mental and physical alertness. St to f/1 1-2 days. Nsg reports new neuro consult.

## 2019-02-19 NOTE — Consult Note (Signed)
Consultation Note Date: 02/19/2019   Patient Name: Jamie Arias  DOB: 1956-03-04  MRN: [id number]  Age / Sex: 63 y.o., female  PCP: [name], MD Referring Physician: Sidney Ace, MD  Reason for Consultation: Establishing goals of care  HPI/Patient Profile:  Jamie Arias is a 63 y.o. female with medical history significant ofESRD on HDMWF,anemia, chronic diastolic congestive heart failure, CAD status post PCI, type 2 diabetes, hypertension, hyperlipidemia, hypothyroidism, and conditions listed belowpresenting via EMS after she was found to be somewhat less responsive since last night and this morning.  According to husband patient was having stomach upset all day yesterday.  And patient has had excessive sweating overnight.  Patient was this morning noticed to be less responsive per her husband and therefore he called EMS to send her to the hospital.   Clinical Assessment and Goals of Care: Patient is resting in bed with eyes closed. Her husband is at bedside. They have been married for 15 years. She has 4 children from a previous marriage. He states she has been in and out of the hospital and was in SNF from August to November. He advises she has only been home about 2 weeks since June. He states she has not been eating well.    We discussed her diagnoses, prognosis, GOC, EOL wishes disposition and options.  A discussion was had today regarding advanced directives.  Concepts specific to code status, artifical feeding and hydration, IV antibiotics and rehospitalization were discussed.  The difference between an aggressive medical intervention path and a comfort care path was discussed.  Values and goals of care important to patient and family were attempted to be elicited.  Discussed limitations of medical interventions to prolong quality of life in some situations and discussed the  concept of human mortality.   He states he is not sure what to do about her care moving forward as they never had these conversations. Encouraged him to speak with loved ones if it would help. He states one of her children want her moved to a hospital in [place]. He is unsure if he wants her moved. He is unsure about a feeding tube, either temporary or permanent. He is unsure about code status. He states he will consider his options and make decisions from there. He understands that if she is not eating and drinking, decisions will need to be made in the next few days.        SUMMARY OF RECOMMENDATIONS   Husband to consider life prolonging care vs transition to comfort. He is unsure if he will defer decision making to her children. Will follow up Monday.    Prognosis:   Poor overall      Primary Diagnoses: Present on Admission: . Altered level of consciousness . Pressure injury of skin . Sepsis due to methicillin resistant Staphylococcus aureus (MRSA) ([place]) . MRSA bacteremia . Hypotension . Arm DVT (deep venous thromboembolism), acute, left ([place]) . ESRD (end stage renal disease) ([place]) . Chronic diastolic CHF (congestive heart  failure) ([place]) . Acute encephalopathy   I have reviewed the medical record, interviewed the patient and family, and examined the patient. The following aspects are pertinent.  Past Medical History:  Diagnosis Date  . Anemia   . Chronic diastolic CHF (congestive heart failure) ([place])    a. Due to ischemic cardiomyopathy. EF as low as 35%, improved to normal s/p CABG; b. echo 07/06/13: EF 55-60%, no RWMAs, mod TR, trivial pericardial effusion not c/w tamponade physiology;  c. 10/2015 Echo: EF 65%, Gr1 DD, triv AI, mild MR, mildly dil LA, mod TR, PASP 62mHg.  .Marland KitchenCoronary artery disease    a. NSTEMI 06/2013; b.cath: severe three-vessel CAD w/ EF 30% & mild-mod MR; c. s/p 3 vessel CABG 07/02/13 (LIMA-LAD, SVG-OM, and SVG-RPDA);  d. 10/2015 MV: no ischemia/infarct.  .  Diabetes mellitus without complication ([facility]   . Diabetic neuropathy ([facility]   . Dialysis patient ([facility]    M[facility]  . ESRD (end stage renal disease) ([place]    a. 12/2015 initiated - mwf dialysis.  .Marland KitchenGERD (gastroesophageal reflux disease)   . Hyperlipidemia   . Hypertension   . Hypothyroidism   . Myocardial infarction ([place] 2015  . Neuropathy   . Pleural effusion 2015  . Pulmonary hypertension ([place]   . Renal insufficiency   . Wears dentures    full lower   Social History   Socioeconomic History  . Marital status: Married    Spouse name: Not on file  . Number of children: Not on file  . Years of education: Not on file  . Highest education level: Not on file  Occupational History  . Occupation: works for pBuilding surveyor Tobacco Use  . Smoking status: Never Smoker  . Smokeless tobacco: Never Used  Substance and Sexual Activity  . Alcohol use: No    Alcohol/week: 0.0 standard drinks  . Drug use: No  . Sexual activity: Not on file  Other Topics Concern  . Not on file  Social History Narrative   Patient lives at home with her husband. Patient has 4 adult children.   Social Determinants of Health   Financial Resource Strain:   . Difficulty of Paying Living Expenses: Not on file  Food Insecurity:   . Worried About RCharity fundraiserin the Last Year: Not on file  . Ran Out of Food in the Last Year: Not on file  Transportation Needs:   . Lack of Transportation (Medical): Not on file  . Lack of Transportation (Non-Medical): Not on file  Physical Activity:   . Days of Exercise per Week: Not on file  . Minutes of Exercise per Session: Not on file  Stress:   . Feeling of Stress : Not on file  Social Connections:   . Frequency of Communication with Friends and Family: Not on file  . Frequency of Social Gatherings with Friends and Family: Not on file  . Attends Religious Services: Not on file  . Active Member of Clubs or Organizations: Not on file  . Attends CTheatre managerMeetings: Not on file  . Marital Status: Not on file   Family History  Problem Relation Age of Onset  . COPD Mother   . Cancer Mother        Lung  . Pulmonary embolism Father   . Diabetes Father   . Diabetes Paternal Grandfather   . Heart disease Maternal Grandmother   . Colon cancer Neg Hx   . Colon polyps Neg Hx   .  Esophageal cancer Neg Hx   . Pancreatic cancer Neg Hx   . Liver disease Neg Hx    Scheduled Meds: . aspirin  81 mg Per Tube Daily  . chlorhexidine  15 mL Mouth Rinse BID  . Chlorhexidine Gluconate Cloth  6 each Topical Q0600  . epoetin (EPOGEN/PROCRIT) injection  4,000 Units Intravenous Q M,W,F-HD  . feeding supplement (NEPRO CARB STEADY)  237 mL Oral TID BM  . FLUoxetine  20 mg Per Tube Daily  . fluticasone  2 spray Each Nare Daily  . insulin aspart  0-9 Units Subcutaneous TID WC  . levothyroxine  88 mcg Per Tube QAC breakfast  . mouth rinse  15 mL Mouth Rinse q12n4p  . midodrine  10 mg Per Tube TID WC  . multivitamin  1 tablet Oral QHS  . pantoprazole (PROTONIX) IV  40 mg Intravenous Q24H  . venlafaxine  75 mg Per Tube BID WC  . zinc sulfate  220 mg Per Tube Daily   Continuous Infusions: . sodium chloride Stopped (02/19/19 0153)  . ceFTAROline (TEFLARO) IV 300 mg (02/19/19 1050)  . DAPTOmycin (CUBICIN)  IV Stopped (02/18/19 2358)  . heparin 900 Units/hr (02/19/19 0347)   PRN Meds:.sodium chloride, ondansetron (ZOFRAN) IV Medications Prior to Admission:  Prior to Admission medications   Medication Sig Start Date End Date Taking? Authorizing Provider  ACCU-CHEK FASTCLIX LANCETS MISC Check blood glucose twice daily, diagnosis code E11.9 12/13/16  Yes [name], MD  acetaminophen (TYLENOL) 325 MG tablet Take 650 mg by mouth daily as needed for moderate pain or headache.    Yes [provider]  Alirocumab (PRALUENT) 75 MG/ML SOAJ Inject 75 mg into the skin every 14 (fourteen) days. 04/04/18  Yes Wellington Hampshire, MD  [place]  Acids-Protein Hydrolys (FEEDING SUPPLEMENT, PRO-STAT SUGAR FREE 64,) LIQD Take 30 mLs by mouth 3 (three) times daily between meals. 09/09/18  Yes Hosie Poisson, MD  amiodarone (PACERONE) 200 MG tablet Take 200 mg by mouth daily.   Yes [provider]  aspirin EC 81 MG tablet Take 81 mg by mouth daily.   Yes [provider]  atorvastatin (LIPITOR) 40 MG tablet Take 40 mg by mouth daily.   Yes [provider]  Blood Glucose Monitoring Suppl (ACCU-CHEK AVIVA PLUS) w/Device KIT Use as directed to test blood sugar up to three times daily 05/06/17  Yes [name], MD  Calcium Acetate 667 MG TABS Take 2,001 mg by mouth 3 (three) times daily with meals.   Yes [provider]  ELIQUIS 5 MG TABS tablet Take 5 mg by mouth 2 (two) times daily.  01/20/19  Yes [provider]  ferrous sulfate 325 (65 FE) MG tablet Take 325 mg by mouth daily with breakfast.   Yes [provider]  FLUoxetine (PROZAC) 20 MG capsule Take 20 mg by mouth daily.   Yes [provider]  fluticasone (FLONASE) 50 MCG/ACT nasal spray Place 2 sprays into both nostrils daily. Patient taking differently: Place 2 sprays into both nostrils daily as needed for allergies or rhinitis.  09/17/17  Yes [name], MD  furosemide (LASIX) 20 MG tablet Take 40 mg by mouth every other day. Non-dialysis days    Yes [provider]  gabapentin (NEURONTIN) 300 MG capsule Take 300 mg by mouth 2 (two) times daily.  01/20/19  Yes [provider]  glucose blood (ACCU-CHEK AVIVA) test strip Use as instructed to test blood sugar up to 3 times  daily 05/06/17  Yes [name], MD  Insulin Glargine (BASAGLAR KWIKPEN) 100 UNIT/ML SOPN Inject 18 Units into the skin daily.   Yes [provider]  Lancets (ACCU-CHEK SOFT TOUCH) lancets Use as instructed 05/06/17  Yes [name], MD  levothyroxine (SYNTHROID) 88 MCG tablet Take 88 mcg by mouth daily before  breakfast.   Yes [provider]  lidocaine-prilocaine (EMLA) cream Apply 1 application topically as needed (port access).   Yes [provider]  midodrine (PROAMATINE) 10 MG tablet Take 1 tablet (10 mg total) by mouth 3 (three) times daily with meals. Patient taking differently: Take 10 mg by mouth 3 (three) times daily as needed (low BP).  09/09/18  Yes Hosie Poisson, MD  multivitamin (RENA-VIT) TABS tablet Take 1 tablet by mouth at bedtime. 09/09/18  Yes Hosie Poisson, MD  nitroGLYCERIN (NITROSTAT) 0.4 MG SL tablet DISSOLVE ONE TABLET UNDER THE TONGUE EVERY 5 MINUTES AS NEEDED FOR CHEST PAIN.  DO NOT EXCEED A TOTAL OF 3 DOSES IN 15 MINUTES Patient taking differently: Place 0.4 mg under the tongue every 5 (five) minutes as needed for chest pain. DISSOLVE ONE TABLET UNDER THE TONGUE EVERY 5 MINUTES AS NEEDED FOR CHEST PAIN.  DO NOT EXCEED A TOTAL OF 3 DOSES IN 15 MINUTES 08/18/18  Yes Wellington Hampshire, MD  ondansetron (ZOFRAN-ODT) 4 MG disintegrating tablet Take 1 tablet (4 mg total) by mouth every 8 (eight) hours as needed for nausea or vomiting. appt with PCP further refills Dr. Caryl Bis 01/28/19  Yes McLean-Scocuzza, Nino Glow, MD  pantoprazole (PROTONIX) 40 MG tablet Take 40 mg by mouth daily. 01/20/19  Yes [provider]  vitamin C (VITAMIN C) 500 MG tablet Take 1 tablet (500 mg total) by mouth daily. 09/10/18  Yes Hosie Poisson, MD  zinc sulfate 220 (50 Zn) MG capsule Take 1 capsule (220 mg total) by mouth daily. 09/10/18  Yes Hosie Poisson, MD  mirtazapine (REMERON) 15 MG tablet Take 1 tablet (15 mg total) by mouth at bedtime. Patient not taking: Reported on 02/09/2019 09/11/18   Mercy Riding, MD   Allergies  Allergen Reactions  . Nsaids Other (See Comments)    Contraindicated due to kidney disease.  Marland Kitchen Doxycycline Other (See Comments)    tremor   Review of Systems  Unable to perform ROS   Physical Exam Constitutional:      Comments: Resting with eyes closed.       Vital Signs: BP (!) 150/65 (BP Location: Right Leg)   Pulse 77   Temp 98.5 F (36.9 C) (Oral)   Resp 20   Ht '5\' 4"'$  (1.626 m)   Wt 74.9 kg   SpO2 100%   BMI 28.34 kg/m  Pain Scale: PAINAD POSS *See Group Information*: 1-Acceptable,Awake and alert Pain Score: 0-No pain   SpO2: SpO2: 100 % O2 Device:SpO2: 100 % O2 Flow Rate: .O2 Flow Rate (L/min): 2 L/min  IO: Intake/output summary:   Intake/Output Summary (Last 24 hours) at 02/19/2019 1349 Last data filed at 02/19/2019 0347 Gross per 24 hour  Intake 1805.77 ml  Output 540 ml  Net 1265.77 ml    LBM: Last BM Date: 02/17/19 Baseline Weight: Weight: 59 kg Most recent weight: Weight: 74.9 kg     Palliative Assessment/Data: 10%     Time In: 12:40 Time Out: 1:30 Time Total: 50 min Greater than 50%  of this time was spent counseling and coordinating care related to the above assessment and plan.  Signed  by: Asencion Gowda, NP   Please contact Palliative Medicine Team phone at [phone] for questions and concerns.  For individual provider: See Shea Evans

## 2019-02-19 NOTE — Care Management Important Message (Signed)
Important Message  Patient Details  Name: Jamie Arias MRN: [id number] Date of Birth: [birth year]   Medicare Important Message Given:  Yes  Reviewed verbally with husband, Jamie Arias, at [phone].  Aware of right.  Husband coming to hospital soon.  Will hand copy of Medicare IM to him outside room upon arrival.     Dannette Barbara 02/19/2019, 10:06 AM

## 2019-02-19 NOTE — Progress Notes (Signed)
Notified Dr. Priscella Mann that lab has attempted multiple times to get blood but have had no success.

## 2019-02-19 NOTE — Progress Notes (Signed)
PROGRESS NOTE    Jamie Arias  [id number] DOB: 1955/04/11 DOA: 02/09/2019 PCP: [name], MD    Brief Narrative:  63 year old female with end-stage renal disease on hemodialysis, admitted to the hospital with severe septic shock second encephalopathic and is unable to consent to the procedure ary to MRSA bacteremia. She had associated metabolic encephalopathy. She is being treated with intravenous vancomycin. She is not requiring any further pressors. Encephalopathy has been slow to improve.  Bacteremia has been persistent without any clear etiology.  CT spine unrevealing for any obvious infectious focus.  Infectious disease following.  Cardiology following.  Plan for inpatient transesophageal echocardiogram currently delayed as patient persistently encephalopathic and is unable to consent for the procedure  12/17: Remains encephalopathic.  Does not provide any reliable history.  Perseverates on the name [name].  Assessment & Plan:   Principal Problem:   Sepsis due to methicillin resistant Staphylococcus aureus (MRSA) ([place]) Active Problems:   Diabetes mellitus, type 2 (HCC)   Hypotension   Chronic diastolic CHF (congestive heart failure) (HCC)   ESRD (end stage renal disease) (HCC)   Altered level of consciousness   Pressure injury of skin   MRSA bacteremia   Arm DVT (deep venous thromboembolism), acute, left (HCC)   Acute encephalopathy   1. Septic shock secondary to MRSA bacteremia.  Shock resolved Source control not achieved Patient remains persistently bacteremic Source of bacteremia is unclear TTE unrevealing CT spine unrevealing for obvious infectious focus TEE planned however patient has been too encephalopathic and cannot consent to the procedure and presents a high risk for anesthesia Staph aureus isolated from heel wound Seen by podiatry, heel wound unlikely source of bacteremia Repeat cultures on 02/18/2019 PENDING Plan: Continue current antibiotic  treatment regimen of daptomycin and ceftaroline Follow blood cultures from 02/18/2019 Continue to monitor mental status and neurologic recovery with tentative plans for TEE during this admission   2. End-stage renal disease on hemodialysis.  Nephrology following for dialysis needs.   3. Acute encephalopathy.  Possible sepsis related MRI of the brain did not show any acute infarct.  EEG did not show any evidence of seizures.  She was noted to have twitching in her lower jaw, but her husband reports this is been present for the past several months.   Ammonia level is normal.   ABG does not show significant CO2 retention. -Continue antibiotic regimen for treatment of MRSA bacteremia -Continue to monitor mental status closely, frequent reorientation measures -Neurology consult: 02/19/2019.  Discussed with Dr. [name].  Recommendations appreciated.  Per neurology lumbar puncture is unlikely to add diagnostic value.  Remainder of neurologic work-up essentially unrevealing.  Persistent encephalopathy likely related to underlying bacteremia and sepsis  4. Acute left arm DVT.  Noted on venous Dopplers.  Started on heparin infusion, but this was held due to oozing around central line.   He had a central line has been removed Continue heparin Plan to transition to Eliquis on discharge  5. History of atrial fibrillation.  She was taking amiodarone and Eliquis prior to admission.  Currently in sinus rhythm.  Continue to follow heart rate.  On heparin for left upper extremity DVT  6. Chronic diastolic congestive heart failure.  Appears to have significant edema.  Can hopefully remove volume with dialysis.  7. Diabetes.  Continue on sliding scale insulin.  Blood sugars are currently stable.  8. Acute on chronic anemia.  Related to blood loss from oozing around IV sites.  Improved after 1 unit  prbc on 12/13  9. Goals of care.   With patient's multiple medical issues, poor functional  nutritional status, her prognosis is poor.   Request palliative care to address goals of care Per notes plan to meet with husband and patient at bedside   DVT prophylaxis: Heparin GTT Code Status: Full code Family Communication: none present Disposition Plan: May need placement pending hospital course   Consultants:   ID  Nephrology  Cardiology  Neurology  Procedures:   Right femoral CVC 12/8> removed  Right femoral art line 12/8> removed  Antimicrobials:   Vancomycin 12/7>12/14  Daptomycin 12/14>  ceftaroline 12/14>   Subjective: Patient is somnolent. She opens her eyes to voice and answers questions. She denies any pain or shortness of breath  Objective: Vitals:   02/18/19 2045 02/19/19 0456 02/19/19 0647 02/19/19 1057  BP: (!) 147/59 (!) 184/66 (!) 159/81 (!) 150/65  Pulse: 64 80 86 77  Resp: 20 20    Temp: 98.9 F (37.2 C) 98.5 F (36.9 C)    TempSrc: Oral Oral    SpO2: 93% 100%    Weight:      Height:        Intake/Output Summary (Last 24 hours) at 02/19/2019 1246 Last data filed at 02/19/2019 0347 Gross per 24 hour  Intake 1805.77 ml  Output 540 ml  Net 1265.77 ml   Filed Weights   02/12/19 1445 02/17/19 2302 02/18/19 1050  Weight: 78.2 kg 74.1 kg 74.9 kg    Examination:  General exam: somnolent, but wakes up to voice Respiratory system: diminished breath sounds bilaterally. Respiratory effort normal. Cardiovascular system:RRR. No murmurs, rubs, gallops. Gastrointestinal system: Abdomen is nondistended, soft and nontender. No organomegaly or masses felt. Normal bowel sounds heard. Central nervous system: No focal neurological deficits. Extremities: 1-2+ edema bilaterally Skin: No rashes, lesions or ulcers Psychiatry: somnolent.    Data Reviewed: I have personally reviewed following labs and imaging studies  CBC: Recent Labs  Lab 02/13/19 0453 02/14/19 0340 02/15/19 0545 02/15/19 1824 02/16/19 0433 02/18/19 0215  WBC 6.9  8.0 8.9  --  8.9 10.6*  HGB 7.9* 7.2* 6.8* 8.8* 8.7* 9.4*  HCT 24.7* 22.2* 20.0* 25.0* 24.7* 28.4*  MCV 86.1 83.8 80.3  --  79.7* 84.3  PLT 107* 106* 105*  --  126* [id number]   Basic Metabolic Panel: Recent Labs  Lab 02/13/19 0453 02/14/19 0340 02/15/19 0545 02/16/19 0433 02/17/19 0533 02/18/19 0215 02/18/19 1200  NA 143 142 138 139 139 137  --   K 3.2* 3.1* 3.6 3.4* 4.0 4.0  --   CL 103 104 102 102 102 100  --   CO2 27 26 24 24 25 23   --   GLUCOSE 108* 109* 96 166* 101* 128*  --   BUN 17 23 26* 30* 14 18  --   CREATININE 3.31* 4.03* 4.64* 5.30* 3.27* 3.95*  --   CALCIUM 8.4* 8.0* 7.9* 8.2* 8.1* 8.0*  --   MG 2.3 2.2 2.2 1.9  --  1.7  --   PHOS 1.7* 2.2* 2.2* 2.5 1.8* 2.7 3.0   GFR: Estimated Creatinine Clearance: 14.1 mL/min (A) (by C-G formula based on SCr of 3.95 mg/dL (H)). Liver Function Tests: Recent Labs  Lab 02/13/19 0453 02/14/19 0340 02/16/19 0433 02/17/19 0533  ALBUMIN 2.2* 2.2* 2.1* 2.0*   No results for input(s): LIPASE, AMYLASE in the last 168 hours. Recent Labs  Lab 02/17/19 0533 02/18/19 1334  AMMONIA 16 10   Coagulation Profile: Recent  Labs  Lab 02/17/19 2011  INR 1.3*   Cardiac Enzymes: Recent Labs  Lab 02/16/19 0433  CKTOTAL 17*   BNP (last 3 results) No results for input(s): PROBNP in the last 8760 hours. HbA1C: No results for input(s): HGBA1C in the last 72 hours. CBG: Recent Labs  Lab 02/18/19 0810 02/18/19 1705 02/18/19 2107 02/19/19 0746 02/19/19 1135  GLUCAP 163* 82 103* 171* 152*   Lipid Profile: No results for input(s): CHOL, HDL, LDLCALC, TRIG, CHOLHDL, LDLDIRECT in the last 72 hours. Thyroid Function Tests: No results for input(s): TSH, T4TOTAL, FREET4, T3FREE, THYROIDAB in the last 72 hours. Anemia Panel: No results for input(s): VITAMINB12, FOLATE, FERRITIN, TIBC, IRON, RETICCTPCT in the last 72 hours. Sepsis Labs: No results for input(s): PROCALCITON, LATICACIDVEN in the last 168 hours.  Recent Results (from the  past 240 hour(s))  Culture, blood (routine x 2)     Status: Abnormal   Collection Time: 02/09/19  2:29 PM   Specimen: BLOOD LEFT ARM  Result Value Ref Range Status   Specimen Description   Final    BLOOD LEFT ARM Performed at [facility], [address], [place], [place] [address]    Special Requests   Final    BOTTLES DRAWN AEROBIC AND ANAEROBIC Blood Culture adequate volume Performed at [facility], [address] [place], [place] [address]    Culture  Setup Time   Final    GRAM POSITIVE COCCI IN BOTH AEROBIC AND ANAEROBIC BOTTLES CRITICAL VALUE NOTED.  VALUE IS CONSISTENT WITH PREVIOUSLY REPORTED AND CALLED VALUE. Performed at [facility], [address] [address] [place], [place] [address]    Culture (A)  Final    STAPHYLOCOCCUS AUREUS SUSCEPTIBILITIES PERFORMED ON PREVIOUS CULTURE WITHIN THE LAST 5 DAYS. Performed at [facility], [place] [address], [place], [place] [address]    Report Status 02/12/2019 FINAL  Final  Culture, blood (routine x 2)     Status: Abnormal   Collection Time: 02/09/19  2:38 PM   Specimen: BLOOD LEFT ARM  Result Value Ref Range Status   Specimen Description   Final    BLOOD LEFT ARM Performed at [facility], [address], [place], [place] [address]    Special Requests   Final    BOTTLES DRAWN AEROBIC AND ANAEROBIC Blood Culture adequate volume Performed at [facility], [address], [place], [place] [address]    Culture  Setup Time   Final    GRAM POSITIVE COCCI IN BOTH AEROBIC AND ANAEROBIC BOTTLES CRITICAL RESULT CALLED TO, READ BACK BY AND VERIFIED WITH: [place] [id number] 02/10/2019 HNM Performed at [facility], [place] [address], [place],  [address]    Culture METHICILLIN RESISTANT STAPHYLOCOCCUS AUREUS (A)  Final   Report Status 02/12/2019 FINAL  Final   Organism ID, Bacteria METHICILLIN RESISTANT STAPHYLOCOCCUS AUREUS  Final      Susceptibility   Methicillin  resistant staphylococcus aureus - MIC*    CIPROFLOXACIN >=8 RESISTANT Resistant     ERYTHROMYCIN >=8 RESISTANT Resistant     GENTAMICIN <=0.5 SENSITIVE Sensitive     OXACILLIN >=4 RESISTANT Resistant     TETRACYCLINE <=1 SENSITIVE Sensitive     VANCOMYCIN 1 SENSITIVE Sensitive     TRIMETH/SULFA <=10 SENSITIVE Sensitive     CLINDAMYCIN RESISTANT Resistant     RIFAMPIN <=0.5 SENSITIVE Sensitive     Inducible Clindamycin POSITIVE Resistant     * METHICILLIN RESISTANT STAPHYLOCOCCUS AUREUS  Blood Culture  ID Panel (Reflexed)     Status: Abnormal   Collection Time: 02/09/19  2:38 PM  Result Value Ref Range Status   Enterococcus species NOT DETECTED NOT DETECTED Final   Listeria monocytogenes NOT DETECTED NOT DETECTED Final   Staphylococcus species DETECTED (A) NOT DETECTED Final    Comment: CRITICAL RESULT CALLED TO, READ BACK BY AND VERIFIED WITH: Hart Robinsons PHARMD [id number] 02/10/2019 HNM    Staphylococcus aureus (BCID) DETECTED (A) NOT DETECTED Final    Comment: Methicillin (oxacillin)-resistant Staphylococcus aureus (MRSA). MRSA is predictably resistant to beta-lactam antibiotics (except ceftaroline). Preferred therapy is vancomycin unless clinically contraindicated. Patient requires contact precautions if  hospitalized. CRITICAL RESULT CALLED TO, READ BACK BY AND VERIFIED WITH: Hart Robinsons PHARMD [id number] 02/10/2019 HNM    Methicillin resistance DETECTED (A) NOT DETECTED Final    Comment: CRITICAL RESULT CALLED TO, READ BACK BY AND VERIFIED WITH: Hart Robinsons PHARMD [id number] 02/10/2019 HNM    Streptococcus species NOT DETECTED NOT DETECTED Final   Streptococcus agalactiae NOT DETECTED NOT DETECTED Final   Streptococcus pneumoniae NOT DETECTED NOT DETECTED Final   Streptococcus pyogenes NOT DETECTED NOT DETECTED Final   Acinetobacter baumannii NOT DETECTED NOT DETECTED Final   Enterobacteriaceae species NOT DETECTED NOT DETECTED Final   Enterobacter cloacae complex NOT DETECTED NOT DETECTED Final    Escherichia coli NOT DETECTED NOT DETECTED Final   Klebsiella oxytoca NOT DETECTED NOT DETECTED Final   Klebsiella pneumoniae NOT DETECTED NOT DETECTED Final   Proteus species NOT DETECTED NOT DETECTED Final   Serratia marcescens NOT DETECTED NOT DETECTED Final   Haemophilus influenzae NOT DETECTED NOT DETECTED Final   Neisseria meningitidis NOT DETECTED NOT DETECTED Final   Pseudomonas aeruginosa NOT DETECTED NOT DETECTED Final   Candida albicans NOT DETECTED NOT DETECTED Final   Candida glabrata NOT DETECTED NOT DETECTED Final   Candida krusei NOT DETECTED NOT DETECTED Final   Candida parapsilosis NOT DETECTED NOT DETECTED Final   Candida tropicalis NOT DETECTED NOT DETECTED Final    Comment: Performed at [facility], [place], [place] [address]  SARS CORONAVIRUS 2 (TAT 6-24 HRS) Nasopharyngeal Nasopharyngeal Swab     Status: None   Collection Time: 02/09/19  2:54 PM   Specimen: Nasopharyngeal Swab  Result Value Ref Range Status   SARS Coronavirus 2 NEGATIVE NEGATIVE Final    Comment: (NOTE) SARS-CoV-2 target nucleic acids are NOT DETECTED. The SARS-CoV-2 RNA is generally detectable in upper and lower respiratory specimens during the acute phase of infection. Negative results do not preclude SARS-CoV-2 infection, do not rule out co-infections with other pathogens, and should not be used as the sole basis for treatment or other patient management decisions. Negative results must be combined with clinical observations, patient history, and epidemiological information. The expected result is Negative. Fact Sheet for Patients: SugarRoll.be Fact Sheet for Healthcare Providers: https://www.woods-mathews.com/ This test is not yet approved or cleared by the [place] FDA and  has been authorized for detection and/or diagnosis of SARS-CoV-2 by FDA under an Emergency Use Authorization (EUA). This EUA will remain  in  effect (meaning this test can be used) for the duration of the COVID-19 declaration under Section 56 4(b)(1) of the Act, 21 U.S.C. section 360bbb-3(b)(1), unless the authorization is terminated or revoked sooner. Performed at [facility], [place] [address] [place], [place] [address]   MRSA PCR Screening     Status: Abnormal   Collection Time: 02/10/19  5:03 PM   Specimen: Nasopharyngeal  Result Value Ref Range Status   MRSA by PCR POSITIVE (A) NEGATIVE Final    Comment:        The GeneXpert MRSA Assay (FDA approved for NASAL specimens only), is one component of a comprehensive MRSA colonization surveillance program. It is not intended to diagnose MRSA infection nor to guide or monitor treatment for MRSA infections. RESULT CALLED TO, READ BACK BY AND VERIFIED WITH: ALEKSEY FRASER @1839  02/10/19 MJU Performed at [facility], [address] [place], [place] [address]   Culture, blood (routine x 2)     Status: Abnormal   Collection Time: 02/11/19 12:31 PM   Specimen: BLOOD  Result Value Ref Range Status   Specimen Description   Final    BLOOD PORTA CATH Performed at [facility] [address], [place], [place] [address]    Special Requests   Final    BOTTLES DRAWN AEROBIC AND ANAEROBIC Blood Culture adequate volume Performed at [facility], [address] [place], [place] [address]    Culture  Setup Time   Final    GRAM POSITIVE COCCI ANAEROBIC BOTTLE ONLY CRITICAL RESULT CALLED TO, READ BACK BY AND VERIFIED WITH: SCOTT HALL AT [id number] 02/12/2019 [facility]  Performed at [facility], [address] [place], [place] [address]    Culture (A)  Final    STAPHYLOCOCCUS AUREUS SUSCEPTIBILITIES PERFORMED ON PREVIOUS CULTURE WITHIN THE LAST 5 DAYS. Performed at [facility], [place] [address] [place], [place] [address]    Report Status 02/13/2019 FINAL  Final  Culture, blood (routine x 2)     Status: None   Collection Time: 02/11/19   3:57 PM   Specimen: BLOOD  Result Value Ref Range Status   Specimen Description BLOOD PORTA CATH  Final   Special Requests   Final    BOTTLES DRAWN AEROBIC AND ANAEROBIC Blood Culture adequate volume   Culture   Final    NO GROWTH 5 DAYS Performed at [facility], [address] [place], [place] [address]    Report Status 02/16/2019 FINAL  Final  CULTURE, BLOOD (ROUTINE X 2) w Reflex to ID Panel     Status: Abnormal   Collection Time: 02/13/19  2:18 PM   Specimen: BLOOD LEFT HAND  Result Value Ref Range Status   Specimen Description BLOOD LEFT HAND  Final   Special Requests   Final    BOTTLES DRAWN AEROBIC AND ANAEROBIC Blood Culture results may not be optimal due to an inadequate volume of blood received in culture bottles   Culture  Setup Time   Final    AEROBIC BOTTLE ONLY GRAM POSITIVE COCCI IN CLUSTERS CRITICAL RESULT CALLED TO, READ BACK BY AND VERIFIED WITH: Herbert Pun AT [id number] ON 02/14/2019 [place]. Performed at [facility], [place] [address], [place], [place] [address]    Culture METHICILLIN RESISTANT STAPHYLOCOCCUS AUREUS (A)  Final   Report Status 02/16/2019 FINAL  Final   Organism ID, Bacteria METHICILLIN RESISTANT STAPHYLOCOCCUS AUREUS  Final      Susceptibility   Methicillin resistant staphylococcus aureus - MIC*    CIPROFLOXACIN >=8 RESISTANT Resistant     ERYTHROMYCIN >=8 RESISTANT Resistant     GENTAMICIN <=0.5 SENSITIVE Sensitive     OXACILLIN >=4 RESISTANT Resistant     TETRACYCLINE <=1 SENSITIVE Sensitive     VANCOMYCIN <=0.5 SENSITIVE Sensitive     TRIMETH/SULFA <=10 SENSITIVE Sensitive     CLINDAMYCIN RESISTANT Resistant  RIFAMPIN <=0.5 SENSITIVE Sensitive     Inducible Clindamycin POSITIVE Resistant     * METHICILLIN RESISTANT STAPHYLOCOCCUS AUREUS  Blood Culture ID Panel (Reflexed)     Status: Abnormal   Collection Time: 02/13/19  2:18 PM  Result Value Ref Range Status   Enterococcus species NOT DETECTED NOT DETECTED Final    Listeria monocytogenes NOT DETECTED NOT DETECTED Final   Staphylococcus species DETECTED (A) NOT DETECTED Final    Comment: CRITICAL RESULT CALLED TO, READ BACK BY AND VERIFIED WITH: Herbert Pun AT [id number] ON 02/14/2019 [place].    Staphylococcus aureus (BCID) DETECTED (A) NOT DETECTED Final    Comment: Methicillin (oxacillin)-resistant Staphylococcus aureus (MRSA). MRSA is predictably resistant to beta-lactam antibiotics (except ceftaroline). Preferred therapy is vancomycin unless clinically contraindicated. Patient requires contact precautions if  hospitalized. CRITICAL RESULT CALLED TO, READ BACK BY AND VERIFIED WITH: Herbert Pun AT [id number] ON 02/14/2019 [place].    Methicillin resistance DETECTED (A) NOT DETECTED Final    Comment: CRITICAL RESULT CALLED TO, READ BACK BY AND VERIFIED WITH: Herbert Pun AT [id number] ON 02/14/2019 [place].    Streptococcus species NOT DETECTED NOT DETECTED Final   Streptococcus agalactiae NOT DETECTED NOT DETECTED Final   Streptococcus pneumoniae NOT DETECTED NOT DETECTED Final   Streptococcus pyogenes NOT DETECTED NOT DETECTED Final   Acinetobacter baumannii NOT DETECTED NOT DETECTED Final   Enterobacteriaceae species NOT DETECTED NOT DETECTED Final   Enterobacter cloacae complex NOT DETECTED NOT DETECTED Final   Escherichia coli NOT DETECTED NOT DETECTED Final   Klebsiella oxytoca NOT DETECTED NOT DETECTED Final   Klebsiella pneumoniae NOT DETECTED NOT DETECTED Final   Proteus species NOT DETECTED NOT DETECTED Final   Serratia marcescens NOT DETECTED NOT DETECTED Final   Haemophilus influenzae NOT DETECTED NOT DETECTED Final   Neisseria meningitidis NOT DETECTED NOT DETECTED Final   Pseudomonas aeruginosa NOT DETECTED NOT DETECTED Final   Candida albicans NOT DETECTED NOT DETECTED Final   Candida glabrata NOT DETECTED NOT DETECTED Final   Candida krusei NOT DETECTED NOT DETECTED Final   Candida parapsilosis NOT DETECTED NOT DETECTED Final   Candida  tropicalis NOT DETECTED NOT DETECTED Final    Comment: Performed at [facility], [address] [place], [place] [address]  CULTURE, BLOOD (ROUTINE X 2) w Reflex to ID Panel     Status: None   Collection Time: 02/13/19  3:09 PM   Specimen: BLOOD  Result Value Ref Range Status   Specimen Description BLOOD BLOOD LEFT HAND  Final   Special Requests   Final    BOTTLES DRAWN AEROBIC AND ANAEROBIC Blood Culture adequate volume   Culture   Final    NO GROWTH 5 DAYS Performed at [facility], [address], [place], [place] [address]    Report Status 02/18/2019 FINAL  Final  Culture, blood (routine x 2)     Status: Abnormal   Collection Time: 02/15/19 12:49 PM   Specimen: BLOOD  Result Value Ref Range Status   Specimen Description   Final    BLOOD LT HAND Performed at [facility], [address] [place], [place] [address]    Special Requests   Final    BOTTLES DRAWN AEROBIC AND ANAEROBIC Blood Culture results may not be optimal due to an inadequate volume of blood received in culture bottles Performed at [facility], [address] [place], [place] [address]    Culture  Setup Time   Final    [place]  IN BOTH AEROBIC AND ANAEROBIC BOTTLES CRITICAL RESULT CALLED TO, READ BACK BY AND VERIFIED WITHEleonore Chiquito AT [id number] 02/16/2019 [place] Performed at [facility], [address] [place], [place] [address]    Culture (A)  Final    METHICILLIN RESISTANT STAPHYLOCOCCUS AUREUS STAPHYLOCOCCUS SPECIES (COAGULASE NEGATIVE) THE SIGNIFICANCE OF ISOLATING THIS ORGANISM FROM A SINGLE SET OF BLOOD CULTURES WHEN MULTIPLE SETS ARE DRAWN IS UNCERTAIN. PLEASE NOTIFY THE MICROBIOLOGY DEPARTMENT WITHIN ONE WEEK IF SPECIATION AND SENSITIVITIES ARE REQUIRED. Performed at [facility], [place] [address], [place], [place] [address]    Report Status 02/18/2019 FINAL  Final   Organism ID, Bacteria METHICILLIN RESISTANT STAPHYLOCOCCUS AUREUS   Final      Susceptibility   Methicillin resistant staphylococcus aureus - MIC*    CIPROFLOXACIN >=8 RESISTANT Resistant     ERYTHROMYCIN >=8 RESISTANT Resistant     GENTAMICIN <=0.5 SENSITIVE Sensitive     OXACILLIN >=4 RESISTANT Resistant     TETRACYCLINE <=1 SENSITIVE Sensitive     VANCOMYCIN <=0.5 SENSITIVE Sensitive     TRIMETH/SULFA <=10 SENSITIVE Sensitive     CLINDAMYCIN RESISTANT Resistant     RIFAMPIN <=0.5 SENSITIVE Sensitive     Inducible Clindamycin POSITIVE Resistant     * METHICILLIN RESISTANT STAPHYLOCOCCUS AUREUS  Aerobic Culture (superficial specimen)     Status: None   Collection Time: 02/17/19  1:31 PM   Specimen: Heel; Wound  Result Value Ref Range Status   Specimen Description   Final    HEEL Performed at [facility], [address] [place], [place] [address]    Special Requests   Final    NONE Performed at [facility], [address] [place], [place] [address]    Gram Stain   Final    NO WBC SEEN MODERATE GRAM POSITIVE COCCI IN PAIRS    Culture   Final    ABUNDANT METHICILLIN RESISTANT STAPHYLOCOCCUS AUREUS ABUNDANT DIPHTHEROIDS(CORYNEBACTERIUM SPECIES) Standardized susceptibility testing for this organism is not available. Performed at [facility], [place] [address], [place], [place] [address]    Report Status 02/19/2019 FINAL  Final   Organism ID, Bacteria METHICILLIN RESISTANT STAPHYLOCOCCUS AUREUS  Final      Susceptibility   Methicillin resistant staphylococcus aureus - MIC*    CIPROFLOXACIN >=8 RESISTANT Resistant     ERYTHROMYCIN >=8 RESISTANT Resistant     GENTAMICIN <=0.5 SENSITIVE Sensitive     OXACILLIN >=4 RESISTANT Resistant     TETRACYCLINE <=1 SENSITIVE Sensitive     VANCOMYCIN 1 SENSITIVE Sensitive     TRIMETH/SULFA <=10 SENSITIVE Sensitive     CLINDAMYCIN RESISTANT Resistant     RIFAMPIN <=0.5 SENSITIVE Sensitive     Inducible Clindamycin POSITIVE Resistant     * ABUNDANT METHICILLIN RESISTANT  STAPHYLOCOCCUS AUREUS  CULTURE, BLOOD (ROUTINE X 2) w Reflex to ID Panel     Status: None (Preliminary result)   Collection Time: 02/18/19 11:29 PM   Specimen: BLOOD  Result Value Ref Range Status   Specimen Description BLOOD LEFT ARM  Final   Special Requests   Final    BOTTLES DRAWN AEROBIC AND ANAEROBIC Blood Culture adequate volume   Culture   Final    NO GROWTH < 12 HOURS Performed at [facility], [address] [place], [place] [address]    Report Status PENDING  Incomplete  CULTURE, BLOOD (ROUTINE X 2) w Reflex to ID Panel     Status: None (Preliminary result)  Collection Time: 02/18/19 11:29 PM   Specimen: BLOOD  Result Value Ref Range Status   Specimen Description BLOOD LEFT ARM  Final   Special Requests   Final    BOTTLES DRAWN AEROBIC AND ANAEROBIC Blood Culture adequate volume   Culture   Final    NO GROWTH < 12 HOURS Performed at [facility] [place], [address], [place], [place] [address]    Report Status PENDING  Incomplete         Radiology Studies: CT CERVICAL SPINE W CONTRAST  Result Date: 02/17/2019 CLINICAL DATA:  Persistent MRSA bacteremia EXAM: CT CERVICAL SPINE WITH CONTRAST TECHNIQUE: Multidetector CT imaging of the cervical spine was performed during intravenous contrast administration. Multiplanar CT image reconstructions were also generated. CONTRAST:  177mL OMNIPAQUE IOHEXOL 300 MG/ML  SOLN COMPARISON:  2019 MRI FINDINGS: Alignment: Preserved. Skull base and vertebrae: Vertebral body heights are maintained. There is no cortical destruction. Soft tissues and spinal canal: No prevertebral soft tissue swelling. No evidence of an epidural collection within limitation of artifact obscuring the spinal canal. Disc levels: Multilevel degenerative changes are present without high-grade canal or foraminal stenosis. Upper chest: Partially imaged pleural effusions. Other: None. IMPRESSION: No evidence of discitis/osteomyelitis or collection.  Electronically Signed   By: Macy Mis M.D.   On: 02/17/2019 15:57   CT THORACIC SPINE W CONTRAST  Result Date: 02/17/2019 CLINICAL DATA:  MRSA bacteremia EXAM: CT THORACIC SPINE WITH CONTRAST TECHNIQUE: Multidetector CT images of thoracic was performed according to the standard protocol following intravenous contrast administration. CONTRAST:  140mL OMNIPAQUE IOHEXOL 300 MG/ML  SOLN COMPARISON:  None. FINDINGS: Alignment: Preserved. Vertebrae: Vertebral body heights are maintained. There is no cortical destruction. Paraspinal and other soft tissues: Bilateral pleural effusions and bibasilar atelectasis/consolidation. Partially imaged cardiomegaly. Enlarged main pulmonary artery suggesting pulmonary arterial hypertension. Disc levels: Mild multilevel degenerative changes. There is no high-grade osseous encroachment on the spinal canal or neural foramina. IMPRESSION: No evidence of discitis/osteomyelitis in the thoracic spine. No collection. Bilateral pleural effusions and bibasilar atelectasis/consolidation. Electronically Signed   By: Macy Mis M.D.   On: 02/17/2019 16:17   CT LUMBAR SPINE W CONTRAST  Result Date: 02/17/2019 CLINICAL DATA:  MRSA bacteremia EXAM: CT LUMBAR SPINE WITH CONTRAST TECHNIQUE: Multidetector CT imaging of the lumbar spine was performed with intravenous contrast administration. CONTRAST:  156mL OMNIPAQUE IOHEXOL 300 MG/ML  SOLN COMPARISON:  Via FINDINGS: Segmentation: 5 lumbar type vertebrae. Alignment: Normal. Vertebrae: Vertebral body heights are maintained. There is no cortical destruction. Paraspinal and other soft tissues: Mild presacral edema. No paraspinal collection. Disc levels: There is disc space narrowing and vacuum phenomenon at L5-S1. Multilevel facet hypertrophy is present. Left foraminal stenosis is present at L5-S1. No high-grade canal stenosis with suboptimal evaluation of the spinal canal due to artifact. IMPRESSION: No evidence of discitis/osteomyelitis  in the lumbar spine. Nonspecific mild presacral edema. No paraspinal collection. Electronically Signed   By: Macy Mis M.D.   On: 02/17/2019 16:10   DG Foot 2 Views Left  Result Date: 02/18/2019 CLINICAL DATA:  Left heel wound.  Rule out osteomyelitis. EXAM: LEFT FOOT - 2 VIEW COMPARISON:  None. FINDINGS: Plantar calcaneal spur. No bone destruction or lucency to suggest osteomyelitis. No acute bony abnormality. Specifically, no fracture, subluxation, or dislocation. Vascular calcifications. IMPRESSION: Calcaneal spur.  No evidence of osteomyelitis. Electronically Signed   By: Rolm Baptise M.D.   On: 02/18/2019 18:18        Scheduled Meds: . aspirin  81 mg  Per Tube Daily  . chlorhexidine  15 mL Mouth Rinse BID  . Chlorhexidine Gluconate Cloth  6 each Topical Q0600  . epoetin (EPOGEN/PROCRIT) injection  4,000 Units Intravenous Q M,W,F-HD  . feeding supplement (NEPRO CARB STEADY)  237 mL Oral TID BM  . FLUoxetine  20 mg Per Tube Daily  . fluticasone  2 spray Each Nare Daily  . insulin aspart  0-9 Units Subcutaneous TID WC  . levothyroxine  88 mcg Per Tube QAC breakfast  . mouth rinse  15 mL Mouth Rinse q12n4p  . midodrine  10 mg Per Tube TID WC  . multivitamin  1 tablet Oral QHS  . pantoprazole (PROTONIX) IV  40 mg Intravenous Q24H  . venlafaxine  75 mg Per Tube BID WC  . zinc sulfate  220 mg Per Tube Daily   Continuous Infusions: . sodium chloride Stopped (02/19/19 0153)  . ceFTAROline (TEFLARO) IV 300 mg (02/19/19 1050)  . DAPTOmycin (CUBICIN)  IV Stopped (02/18/19 2358)  . heparin 900 Units/hr (02/19/19 0347)     LOS: 10 days    Time spent: 24mins    Sidney Ace, MD [facility] Pager: [phone]  If 7PM-7AM, please contact night-coverage [email or web address]  02/19/2019, 12:46 PM

## 2019-02-19 NOTE — Progress Notes (Signed)
[place], [place] 02/19/19  Subjective:  Patient completed hemodialysis treatment yesterday. Tolerated well. Podiatry saw the patient for heel ulceration.  Objective:  Vital signs in last 24 hours:  Temp:  [98.5 F (36.9 C)-98.9 F (37.2 C)] 98.5 F (36.9 C) (12/17 0456) Pulse Rate:  [64-86] 77 (12/17 1057) Resp:  [6-20] 20 (12/17 0456) BP: (87-184)/(45-81) 150/65 (12/17 1057) SpO2:  [93 %-100 %] 100 % (12/17 0456)  Weight change: 0.8 kg Filed Weights   02/12/19 1445 02/17/19 2302 02/18/19 1050  Weight: 78.2 kg 74.1 kg 74.9 kg    Intake/Output:    Intake/Output Summary (Last 24 hours) at 02/19/2019 1208 Last data filed at 02/19/2019 0347 Gross per 24 hour  Intake 1805.77 ml  Output 540 ml  Net 1265.77 ml     Physical Exam: General:  No acute distress, chronically ill-appearing  HEENT  [place]/AT, hearing intact  Pulm/lungs  clear bilateral, normal effort  CVS/Heart  S1S2 no rubs  Abdomen:   Soft, nontender  Extremities:  Trace bilateral lower extremity edema  Neurologic:  Resting comfortably  Skin:  Warm  Access:  AV fistula, right arm       Basic Metabolic Panel:  Recent Labs  Lab 02/13/19 0453 02/14/19 0340 02/15/19 0545 02/16/19 0433 02/17/19 0533 02/18/19 0215 02/18/19 1200  NA 143 142 138 139 139 137  --   K 3.2* 3.1* 3.6 3.4* 4.0 4.0  --   CL 103 104 102 102 102 100  --   CO2 27 26 24 24 25 23   --   GLUCOSE 108* 109* 96 166* 101* 128*  --   BUN 17 23 26* 30* 14 18  --   CREATININE 3.31* 4.03* 4.64* 5.30* 3.27* 3.95*  --   CALCIUM 8.4* 8.0* 7.9* 8.2* 8.1* 8.0*  --   MG 2.3 2.2 2.2 1.9  --  1.7  --   PHOS 1.7* 2.2* 2.2* 2.5 1.8* 2.7 3.0     CBC: Recent Labs  Lab 02/13/19 0453 02/14/19 0340 02/15/19 0545 02/15/19 1824 02/16/19 0433 02/18/19 0215  WBC 6.9 8.0 8.9  --  8.9 10.6*  HGB 7.9* 7.2* 6.8* 8.8* 8.7* 9.4*  HCT 24.7* 22.2* 20.0* 25.0* 24.7* 28.4*  MCV 86.1 83.8 80.3  --  79.7* 84.3  PLT 107* 106*  105*  --  126* 177      Lab Results  Component Value Date   HEPBSAG NON REACTIVE 02/10/2019      Microbiology:  Recent Results (from the past 240 hour(s))  Culture, blood (routine x 2)     Status: Abnormal   Collection Time: 02/09/19  2:29 PM   Specimen: BLOOD LEFT ARM  Result Value Ref Range Status   Specimen Description   Final    BLOOD LEFT ARM Performed at [facility], [address] [place], [place] [address]    Special Requests   Final    BOTTLES DRAWN AEROBIC AND ANAEROBIC Blood Culture adequate volume Performed at [facility], [address] [place], [place] [address]    Culture  Setup Time   Final    GRAM POSITIVE COCCI IN BOTH AEROBIC AND ANAEROBIC BOTTLES CRITICAL VALUE NOTED.  VALUE IS CONSISTENT WITH PREVIOUSLY REPORTED AND CALLED VALUE. Performed at [facility], [address] [place], [place] [address]    Culture (A)  Final    STAPHYLOCOCCUS AUREUS SUSCEPTIBILITIES PERFORMED ON PREVIOUS CULTURE WITHIN THE LAST 5 DAYS. Performed at [facility], [address] [place], [place] [address]    Report Status 02/12/2019 FINAL  Final  Culture, blood (routine x 2)     Status: Abnormal   Collection Time: 02/09/19  2:38 PM   Specimen: BLOOD LEFT ARM  Result Value Ref Range Status   Specimen Description   Final    BLOOD LEFT ARM Performed at [facility], [address], [place], [place] [address]    Special Requests   Final    BOTTLES DRAWN AEROBIC AND ANAEROBIC Blood Culture adequate volume Performed at [facility], [address], [place], [place] [address]    Culture  Setup Time   Final    GRAM POSITIVE COCCI IN BOTH AEROBIC AND ANAEROBIC BOTTLES CRITICAL RESULT CALLED TO, READ BACK BY AND VERIFIED WITH: Hart Robinsons [facility] [id number] 02/10/2019 HNM Performed at [facility], [place] [address], [place],  [address]    Culture METHICILLIN RESISTANT STAPHYLOCOCCUS AUREUS (A)  Final   Report  Status 02/12/2019 FINAL  Final   Organism ID, Bacteria METHICILLIN RESISTANT STAPHYLOCOCCUS AUREUS  Final      Susceptibility   Methicillin resistant staphylococcus aureus - MIC*    CIPROFLOXACIN >=8 RESISTANT Resistant     ERYTHROMYCIN >=8 RESISTANT Resistant     GENTAMICIN <=0.5 SENSITIVE Sensitive     OXACILLIN >=4 RESISTANT Resistant     TETRACYCLINE <=1 SENSITIVE Sensitive     VANCOMYCIN 1 SENSITIVE Sensitive     TRIMETH/SULFA <=10 SENSITIVE Sensitive     CLINDAMYCIN RESISTANT Resistant     RIFAMPIN <=0.5 SENSITIVE Sensitive     Inducible Clindamycin POSITIVE Resistant     * METHICILLIN RESISTANT STAPHYLOCOCCUS AUREUS  Blood Culture ID Panel (Reflexed)     Status: Abnormal   Collection Time: 02/09/19  2:38 PM  Result Value Ref Range Status   Enterococcus species NOT DETECTED NOT DETECTED Final   Listeria monocytogenes NOT DETECTED NOT DETECTED Final   Staphylococcus species DETECTED (A) NOT DETECTED Final    Comment: CRITICAL RESULT CALLED TO, READ BACK BY AND VERIFIED WITH: Hart Robinsons PHARMD [id number] 02/10/2019 HNM    Staphylococcus aureus (BCID) DETECTED (A) NOT DETECTED Final    Comment: Methicillin (oxacillin)-resistant Staphylococcus aureus (MRSA). MRSA is predictably resistant to beta-lactam antibiotics (except ceftaroline). Preferred therapy is vancomycin unless clinically contraindicated. Patient requires contact precautions if  hospitalized. CRITICAL RESULT CALLED TO, READ BACK BY AND VERIFIED WITH: Hart Robinsons PHARMD [id number] 02/10/2019 HNM    Methicillin resistance DETECTED (A) NOT DETECTED Final    Comment: CRITICAL RESULT CALLED TO, READ BACK BY AND VERIFIED WITH: Hart Robinsons PHARMD [id number] 02/10/2019 HNM    Streptococcus species NOT DETECTED NOT DETECTED Final   Streptococcus agalactiae NOT DETECTED NOT DETECTED Final   Streptococcus pneumoniae NOT DETECTED NOT DETECTED Final   Streptococcus pyogenes NOT DETECTED NOT DETECTED Final   Acinetobacter baumannii NOT DETECTED NOT  DETECTED Final   Enterobacteriaceae species NOT DETECTED NOT DETECTED Final   Enterobacter cloacae complex NOT DETECTED NOT DETECTED Final   Escherichia coli NOT DETECTED NOT DETECTED Final   Klebsiella oxytoca NOT DETECTED NOT DETECTED Final   Klebsiella pneumoniae NOT DETECTED NOT DETECTED Final   Proteus species NOT DETECTED NOT DETECTED Final   Serratia marcescens NOT DETECTED NOT DETECTED Final   Haemophilus influenzae NOT DETECTED NOT DETECTED Final   Neisseria meningitidis NOT DETECTED NOT DETECTED Final   Pseudomonas aeruginosa NOT DETECTED NOT DETECTED Final   Candida albicans NOT DETECTED NOT DETECTED Final   Candida glabrata  NOT DETECTED NOT DETECTED Final   Candida krusei NOT DETECTED NOT DETECTED Final   Candida parapsilosis NOT DETECTED NOT DETECTED Final   Candida tropicalis NOT DETECTED NOT DETECTED Final    Comment: Performed at [facility], [place], [place] [address]  SARS CORONAVIRUS 2 (TAT 6-24 HRS) Nasopharyngeal Nasopharyngeal Swab     Status: None   Collection Time: 02/09/19  2:54 PM   Specimen: Nasopharyngeal Swab  Result Value Ref Range Status   SARS Coronavirus 2 NEGATIVE NEGATIVE Final    Comment: (NOTE) SARS-CoV-2 target nucleic acids are NOT DETECTED. The SARS-CoV-2 RNA is generally detectable in upper and lower respiratory specimens during the acute phase of infection. Negative results do not preclude SARS-CoV-2 infection, do not rule out co-infections with other pathogens, and should not be used as the sole basis for treatment or other patient management decisions. Negative results must be combined with clinical observations, patient history, and epidemiological information. The expected result is Negative. Fact Sheet for Patients: SugarRoll.be Fact Sheet for Healthcare Providers: https://www.woods-mathews.com/ This test is not yet approved or cleared by the [place] FDA and   has been authorized for detection and/or diagnosis of SARS-CoV-2 by FDA under an Emergency Use Authorization (EUA). This EUA will remain  in effect (meaning this test can be used) for the duration of the COVID-19 declaration under Section 56 4(b)(1) of the Act, 21 U.S.C. section 360bbb-3(b)(1), unless the authorization is terminated or revoked sooner. Performed at [facility], [place] [address], [place], [place] [address]   MRSA PCR Screening     Status: Abnormal   Collection Time: 02/10/19  5:03 PM   Specimen: Nasopharyngeal  Result Value Ref Range Status   MRSA by PCR POSITIVE (A) NEGATIVE Final    Comment:        The GeneXpert MRSA Assay (FDA approved for NASAL specimens only), is one component of a comprehensive MRSA colonization surveillance program. It is not intended to diagnose MRSA infection nor to guide or monitor treatment for MRSA infections. RESULT CALLED TO, READ BACK BY AND VERIFIED WITH: ALEKSEY FRASER @1839  02/10/19 MJU Performed at [facility], [address] [place], [place] [address]   Culture, blood (routine x 2)     Status: Abnormal   Collection Time: 02/11/19 12:31 PM   Specimen: BLOOD  Result Value Ref Range Status   Specimen Description   Final    BLOOD PORTA CATH Performed at [place] [address] [place], [place] [address]    Special Requests   Final    BOTTLES DRAWN AEROBIC AND ANAEROBIC Blood Culture adequate volume Performed at [facility], [address] [place], [place] [address]    Culture  Setup Time   Final    GRAM POSITIVE COCCI ANAEROBIC BOTTLE ONLY CRITICAL RESULT CALLED TO, READ BACK BY AND VERIFIED WITH: SCOTT HALL AT [id number] 02/12/2019 [facility]  Performed at [facility], [address] [place], [place] [address]    Culture (A)  Final    STAPHYLOCOCCUS AUREUS SUSCEPTIBILITIES PERFORMED ON PREVIOUS CULTURE WITHIN THE LAST 5 DAYS. Performed at [facility], [place] [address] [place], [place] [address]    Report Status 02/13/2019 FINAL  Final  Culture, blood (routine x 2)     Status: None   Collection Time: 02/11/19  3:57 PM   Specimen: BLOOD  Result Value Ref Range Status   Specimen Description BLOOD PORTA CATH  Final  Special Requests   Final    BOTTLES DRAWN AEROBIC AND ANAEROBIC Blood Culture adequate volume   Culture   Final    NO GROWTH 5 DAYS Performed at [facility], [address] [place], [place] [address]    Report Status 02/16/2019 FINAL  Final  CULTURE, BLOOD (ROUTINE X 2) w Reflex to ID Panel     Status: Abnormal   Collection Time: 02/13/19  2:18 PM   Specimen: BLOOD LEFT HAND  Result Value Ref Range Status   Specimen Description BLOOD LEFT HAND  Final   Special Requests   Final    BOTTLES DRAWN AEROBIC AND ANAEROBIC Blood Culture results may not be optimal due to an inadequate volume of blood received in culture bottles   Culture  Setup Time   Final    AEROBIC BOTTLE ONLY GRAM POSITIVE COCCI IN CLUSTERS CRITICAL RESULT CALLED TO, READ BACK BY AND VERIFIED WITH: Herbert Pun AT [id number] ON 02/14/2019 [place]. Performed at [facility], [place] [address], [place], [place] [address]    Culture METHICILLIN RESISTANT STAPHYLOCOCCUS AUREUS (A)  Final   Report Status 02/16/2019 FINAL  Final   Organism ID, Bacteria METHICILLIN RESISTANT STAPHYLOCOCCUS AUREUS  Final      Susceptibility   Methicillin resistant staphylococcus aureus - MIC*    CIPROFLOXACIN >=8 RESISTANT Resistant     ERYTHROMYCIN >=8 RESISTANT Resistant     GENTAMICIN <=0.5 SENSITIVE Sensitive     OXACILLIN >=4 RESISTANT Resistant     TETRACYCLINE <=1 SENSITIVE Sensitive     VANCOMYCIN <=0.5 SENSITIVE Sensitive     TRIMETH/SULFA <=10 SENSITIVE Sensitive     CLINDAMYCIN RESISTANT Resistant     RIFAMPIN <=0.5 SENSITIVE Sensitive     Inducible Clindamycin POSITIVE Resistant     * METHICILLIN RESISTANT STAPHYLOCOCCUS AUREUS  Blood Culture ID Panel (Reflexed)      Status: Abnormal   Collection Time: 02/13/19  2:18 PM  Result Value Ref Range Status   Enterococcus species NOT DETECTED NOT DETECTED Final   Listeria monocytogenes NOT DETECTED NOT DETECTED Final   Staphylococcus species DETECTED (A) NOT DETECTED Final    Comment: CRITICAL RESULT CALLED TO, READ BACK BY AND VERIFIED WITH: ABBEY ELLINGTON AT [id number] ON 02/14/2019 [place].    Staphylococcus aureus (BCID) DETECTED (A) NOT DETECTED Final    Comment: Methicillin (oxacillin)-resistant Staphylococcus aureus (MRSA). MRSA is predictably resistant to beta-lactam antibiotics (except ceftaroline). Preferred therapy is vancomycin unless clinically contraindicated. Patient requires contact precautions if  hospitalized. CRITICAL RESULT CALLED TO, READ BACK BY AND VERIFIED WITH: Herbert Pun AT [id number] ON 02/14/2019 [place].    Methicillin resistance DETECTED (A) NOT DETECTED Final    Comment: CRITICAL RESULT CALLED TO, READ BACK BY AND VERIFIED WITH: Herbert Pun AT [id number] ON 02/14/2019 [place].    Streptococcus species NOT DETECTED NOT DETECTED Final   Streptococcus agalactiae NOT DETECTED NOT DETECTED Final   Streptococcus pneumoniae NOT DETECTED NOT DETECTED Final   Streptococcus pyogenes NOT DETECTED NOT DETECTED Final   Acinetobacter baumannii NOT DETECTED NOT DETECTED Final   Enterobacteriaceae species NOT DETECTED NOT DETECTED Final   Enterobacter cloacae complex NOT DETECTED NOT DETECTED Final   Escherichia coli NOT DETECTED NOT DETECTED Final   Klebsiella oxytoca NOT DETECTED NOT DETECTED Final   Klebsiella pneumoniae NOT DETECTED NOT DETECTED Final   Proteus species NOT DETECTED NOT DETECTED Final   Serratia marcescens NOT DETECTED NOT DETECTED Final   Haemophilus influenzae NOT DETECTED NOT DETECTED Final   Neisseria meningitidis  NOT DETECTED NOT DETECTED Final   Pseudomonas aeruginosa NOT DETECTED NOT DETECTED Final   Candida albicans NOT DETECTED NOT DETECTED Final   Candida glabrata NOT DETECTED  NOT DETECTED Final   Candida krusei NOT DETECTED NOT DETECTED Final   Candida parapsilosis NOT DETECTED NOT DETECTED Final   Candida tropicalis NOT DETECTED NOT DETECTED Final    Comment: Performed at [facility], [address] [place], [place] [address]  CULTURE, BLOOD (ROUTINE X 2) w Reflex to ID Panel     Status: None   Collection Time: 02/13/19  3:09 PM   Specimen: BLOOD  Result Value Ref Range Status   Specimen Description BLOOD BLOOD LEFT HAND  Final   Special Requests   Final    BOTTLES DRAWN AEROBIC AND ANAEROBIC Blood Culture adequate volume   Culture   Final    NO GROWTH 5 DAYS Performed at [facility], [address] [place], [place] [address]    Report Status 02/18/2019 FINAL  Final  Culture, blood (routine x 2)     Status: Abnormal   Collection Time: 02/15/19 12:49 PM   Specimen: BLOOD  Result Value Ref Range Status   Specimen Description   Final    BLOOD LT HAND Performed at [facility], [address] [place], [place] [address]    Special Requests   Final    BOTTLES DRAWN AEROBIC AND ANAEROBIC Blood Culture results may not be optimal due to an inadequate volume of blood received in culture bottles Performed at [facility], [address] [place], [place] [address]    Culture  Setup Time   Final    GRAM POSITIVE COCCI IN BOTH AEROBIC AND ANAEROBIC BOTTLES CRITICAL RESULT CALLED TO, READ BACK BY AND VERIFIED WITHEleonore Chiquito AT [id number] 02/16/2019 [place] Performed at [facility], [address] [place], [place] [address]    Culture (A)  Final    METHICILLIN RESISTANT STAPHYLOCOCCUS AUREUS STAPHYLOCOCCUS SPECIES (COAGULASE NEGATIVE) THE SIGNIFICANCE OF ISOLATING THIS ORGANISM FROM A SINGLE SET OF BLOOD CULTURES WHEN MULTIPLE SETS ARE DRAWN IS UNCERTAIN. PLEASE NOTIFY THE MICROBIOLOGY DEPARTMENT WITHIN ONE WEEK IF SPECIATION AND SENSITIVITIES ARE REQUIRED. Performed at [facility], [place] [address],  [place], [place] [address]    Report Status 02/18/2019 FINAL  Final   Organism ID, Bacteria METHICILLIN RESISTANT STAPHYLOCOCCUS AUREUS  Final      Susceptibility   Methicillin resistant staphylococcus aureus - MIC*    CIPROFLOXACIN >=8 RESISTANT Resistant     ERYTHROMYCIN >=8 RESISTANT Resistant     GENTAMICIN <=0.5 SENSITIVE Sensitive     OXACILLIN >=4 RESISTANT Resistant     TETRACYCLINE <=1 SENSITIVE Sensitive     VANCOMYCIN <=0.5 SENSITIVE Sensitive     TRIMETH/SULFA <=10 SENSITIVE Sensitive     CLINDAMYCIN RESISTANT Resistant     RIFAMPIN <=0.5 SENSITIVE Sensitive     Inducible Clindamycin POSITIVE Resistant     * METHICILLIN RESISTANT STAPHYLOCOCCUS AUREUS  Aerobic Culture (superficial specimen)     Status: None   Collection Time: 02/17/19  1:31 PM   Specimen: Heel; Wound  Result Value Ref Range Status   Specimen Description   Final    HEEL Performed at [facility], [address], [place], [place] [address]    Special Requests   Final    NONE Performed at [facility], [address] [place], [place] [address]    Gram Stain   Final    NO  WBC SEEN MODERATE GRAM POSITIVE COCCI IN PAIRS    Culture   Final    ABUNDANT METHICILLIN RESISTANT STAPHYLOCOCCUS AUREUS ABUNDANT DIPHTHEROIDS(CORYNEBACTERIUM SPECIES) Standardized susceptibility testing for this organism is not available. Performed at [facility], [place] [address] [place], [place] [address]    Report Status 02/19/2019 FINAL  Final   Organism ID, Bacteria METHICILLIN RESISTANT STAPHYLOCOCCUS AUREUS  Final      Susceptibility   Methicillin resistant staphylococcus aureus - MIC*    CIPROFLOXACIN >=8 RESISTANT Resistant     ERYTHROMYCIN >=8 RESISTANT Resistant     GENTAMICIN <=0.5 SENSITIVE Sensitive     OXACILLIN >=4 RESISTANT Resistant     TETRACYCLINE <=1 SENSITIVE Sensitive     VANCOMYCIN 1 SENSITIVE Sensitive     TRIMETH/SULFA <=10 SENSITIVE Sensitive     CLINDAMYCIN RESISTANT  Resistant     RIFAMPIN <=0.5 SENSITIVE Sensitive     Inducible Clindamycin POSITIVE Resistant     * ABUNDANT METHICILLIN RESISTANT STAPHYLOCOCCUS AUREUS  CULTURE, BLOOD (ROUTINE X 2) w Reflex to ID Panel     Status: None (Preliminary result)   Collection Time: 02/18/19 11:29 PM   Specimen: BLOOD  Result Value Ref Range Status   Specimen Description BLOOD LEFT ARM  Final   Special Requests   Final    BOTTLES DRAWN AEROBIC AND ANAEROBIC Blood Culture adequate volume   Culture   Final    NO GROWTH < 12 HOURS Performed at [facility], [address] [place], [place] [address]    Report Status PENDING  Incomplete  CULTURE, BLOOD (ROUTINE X 2) w Reflex to ID Panel     Status: None (Preliminary result)   Collection Time: 02/18/19 11:29 PM   Specimen: BLOOD  Result Value Ref Range Status   Specimen Description BLOOD LEFT ARM  Final   Special Requests   Final    BOTTLES DRAWN AEROBIC AND ANAEROBIC Blood Culture adequate volume   Culture   Final    NO GROWTH < 12 HOURS Performed at [facility], [address] [place], [place] [address]    Report Status PENDING  Incomplete    Coagulation Studies: Recent Labs    02/17/19 2009/06/11  LABPROT 15.6*  INR 1.3*    Urinalysis: No results for input(s): COLORURINE, LABSPEC, PHURINE, GLUCOSEU, HGBUR, BILIRUBINUR, KETONESUR, PROTEINUR, UROBILINOGEN, NITRITE, LEUKOCYTESUR in the last 72 hours.  Invalid input(s): APPERANCEUR    Imaging: CT CERVICAL SPINE W CONTRAST  Result Date: 02/17/2019 CLINICAL DATA:  Persistent MRSA bacteremia EXAM: CT CERVICAL SPINE WITH CONTRAST TECHNIQUE: Multidetector CT imaging of the cervical spine was performed during intravenous contrast administration. Multiplanar CT image reconstructions were also generated. CONTRAST:  171mL OMNIPAQUE IOHEXOL 300 MG/ML  SOLN COMPARISON:  06-11-2017 MRI FINDINGS: Alignment: Preserved. Skull base and vertebrae: Vertebral body heights are maintained. There is no  cortical destruction. Soft tissues and spinal canal: No prevertebral soft tissue swelling. No evidence of an epidural collection within limitation of artifact obscuring the spinal canal. Disc levels: Multilevel degenerative changes are present without high-grade canal or foraminal stenosis. Upper chest: Partially imaged pleural effusions. Other: None. IMPRESSION: No evidence of discitis/osteomyelitis or collection. Electronically Signed   By: Macy Mis M.D.   On: 02/17/2019 15:57   CT THORACIC SPINE W CONTRAST  Result Date: 02/17/2019 CLINICAL DATA:  MRSA bacteremia EXAM: CT THORACIC SPINE WITH CONTRAST TECHNIQUE: Multidetector CT images of thoracic was performed according to the standard protocol following intravenous contrast administration. CONTRAST:  112mL OMNIPAQUE IOHEXOL  300 MG/ML  SOLN COMPARISON:  None. FINDINGS: Alignment: Preserved. Vertebrae: Vertebral body heights are maintained. There is no cortical destruction. Paraspinal and other soft tissues: Bilateral pleural effusions and bibasilar atelectasis/consolidation. Partially imaged cardiomegaly. Enlarged main pulmonary artery suggesting pulmonary arterial hypertension. Disc levels: Mild multilevel degenerative changes. There is no high-grade osseous encroachment on the spinal canal or neural foramina. IMPRESSION: No evidence of discitis/osteomyelitis in the thoracic spine. No collection. Bilateral pleural effusions and bibasilar atelectasis/consolidation. Electronically Signed   By: Macy Mis M.D.   On: 02/17/2019 16:17   CT LUMBAR SPINE W CONTRAST  Result Date: 02/17/2019 CLINICAL DATA:  MRSA bacteremia EXAM: CT LUMBAR SPINE WITH CONTRAST TECHNIQUE: Multidetector CT imaging of the lumbar spine was performed with intravenous contrast administration. CONTRAST:  180mL OMNIPAQUE IOHEXOL 300 MG/ML  SOLN COMPARISON:  Via FINDINGS: Segmentation: 5 lumbar type vertebrae. Alignment: Normal. Vertebrae: Vertebral body heights are maintained.  There is no cortical destruction. Paraspinal and other soft tissues: Mild presacral edema. No paraspinal collection. Disc levels: There is disc space narrowing and vacuum phenomenon at L5-S1. Multilevel facet hypertrophy is present. Left foraminal stenosis is present at L5-S1. No high-grade canal stenosis with suboptimal evaluation of the spinal canal due to artifact. IMPRESSION: No evidence of discitis/osteomyelitis in the lumbar spine. Nonspecific mild presacral edema. No paraspinal collection. Electronically Signed   By: Macy Mis M.D.   On: 02/17/2019 16:10   DG Foot 2 Views Left  Result Date: 02/18/2019 CLINICAL DATA:  Left heel wound.  Rule out osteomyelitis. EXAM: LEFT FOOT - 2 VIEW COMPARISON:  None. FINDINGS: Plantar calcaneal spur. No bone destruction or lucency to suggest osteomyelitis. No acute bony abnormality. Specifically, no fracture, subluxation, or dislocation. Vascular calcifications. IMPRESSION: Calcaneal spur.  No evidence of osteomyelitis. Electronically Signed   By: Rolm Baptise M.D.   On: 02/18/2019 18:18     Medications:   . sodium chloride Stopped (02/19/19 0153)  . ceFTAROline (TEFLARO) IV 300 mg (02/19/19 1050)  . DAPTOmycin (CUBICIN)  IV Stopped (02/18/19 2358)  . heparin 900 Units/hr (02/19/19 0347)   . aspirin  81 mg Per Tube Daily  . chlorhexidine  15 mL Mouth Rinse BID  . Chlorhexidine Gluconate Cloth  6 each Topical Q0600  . epoetin (EPOGEN/PROCRIT) injection  4,000 Units Intravenous Q M,W,F-HD  . feeding supplement (NEPRO CARB STEADY)  237 mL Oral TID BM  . FLUoxetine  20 mg Per Tube Daily  . fluticasone  2 spray Each Nare Daily  . insulin aspart  0-9 Units Subcutaneous TID WC  . levothyroxine  88 mcg Per Tube QAC breakfast  . mouth rinse  15 mL Mouth Rinse q12n4p  . midodrine  10 mg Per Tube TID WC  . multivitamin  1 tablet Oral QHS  . pantoprazole (PROTONIX) IV  40 mg Intravenous Q24H  . venlafaxine  75 mg Per Tube BID WC  . zinc sulfate  220 mg  Per Tube Daily   sodium chloride, ondansetron (ZOFRAN) IV  Assessment/ Plan:  63 y.o.caucasian female with end-stage renal disease, atrial fibrillation, chronic diastolic congestive heart failure, CAD status post PCI, CABG,  type 2 diabetes, hypertension, hyperlipidemia, hypothyroidism, Covid positive in July 2020, recent nursing home stay in [place]  NEPHROLOGIST: Lavonia Dana MD  LOCATION: [place]: M-W-F 2nd Shift  EDW: 81.5 kg.  KIDNEY: [place] Optiflux 180NR  LITERS PROC: liters/treatment  HD TIME: 225  ACCESS: R Upper Arm AVF  NEEDLE SIZE:  ANTICOAG: Custom Heparin 2000  BATH: 2K/2.5Ca  QB: 400 ml/min  QD: 600 ml/min   1.  End-stage renal disease -Patient completed dialysis treatment yesterday.  Tolerated well.  No acute indication for dialysis today.  2.  Anemia of chronic kidney disease anemia of chronic kidney disease Lab Results  Component Value Date   HGB 9.4 (L) 02/18/2019  -Hemoglobin 9.4 at last check.  Maintain the patient on Epogen 4070 with dialysis.  3. SHPTH -serum phosphorus was 3.0 yesterday.  Continue.  Monitor.  4. Altered mental status -Has lethargy at times still.    5. Sepsis - blood culture positive for MRSA from 02/09/2019  ? Source; ID team following Patient started on ceftaroline and on Cubicin. Ultrasound of right upper extremity appears more consistent with hematoma rather than infection however infection not totally excluded.  CT of the C/T/L-spine negative for osteomyelitis. TEE being considered however patient cannot consent.  6. Hypokalemia Continue to monitor serum potassium periodically.      LOS: 10 Kolt Mcwhirter 12/17/202012:08 PM  [place], [place]  Note: This note was prepared with Dragon dictation. Any transcription errors are unintentional

## 2019-02-19 NOTE — Progress Notes (Signed)
    Patient remains lethargic and is not following commands. Not currently a candidate for TEE. Will reassess 02/20/2019.

## 2019-02-19 NOTE — Consult Note (Signed)
ANTICOAGULATION CONSULT NOTE  Pharmacy Consult for apixaban Indication: DVT   Patient Measurements: Height: 5\' 4"  (162.6 cm) Weight: 165 lb 2 oz (74.9 kg) IBW/kg (Calculated) : 54.7 Heparin Dosing Weight: 71 kg  Vital Signs: Temp: 98.5 F (36.9 C) (12/17 0456) Temp Source: Oral (12/17 0456) BP: 159/81 (12/17 0647) Pulse Rate: 86 (12/17 0647)  Labs: Recent Labs    02/17/19 0533 02/17/19 2011 02/18/19 0215 02/18/19 2329  HGB  --   --  9.4*  --   HCT  --   --  28.4*  --   PLT  --   --  177  --   APTT  --  >160* 109* >160*  LABPROT  --  15.6*  --   --   INR  --  1.3*  --   --   HEPARINUNFRC  --  0.68 0.29*  --   CREATININE 3.27*  --  3.95*  --     Estimated Creatinine Clearance: 14.1 mL/min (A) (by C-G formula based on SCr of 3.95 mg/dL (H)).   Medical History: Past Medical History:  Diagnosis Date  . Anemia   . Chronic diastolic CHF (congestive heart failure) ([place])    a. Due to ischemic cardiomyopathy. EF as low as 35%, improved to normal s/p CABG; b. echo 07/06/13: EF 55-60%, no RWMAs, mod TR, trivial pericardial effusion not c/w tamponade physiology;  c. 10/2015 Echo: EF 65%, Gr1 DD, triv AI, mild MR, mildly dil LA, mod TR, PASP 44mmHg.  Marland Kitchen Coronary artery disease    a. NSTEMI 06/2013; b.cath: severe three-vessel CAD w/ EF 30% & mild-mod MR; c. s/p 3 vessel CABG 07/02/13 (LIMA-LAD, SVG-OM, and SVG-RPDA);  d. 10/2015 MV: no ischemia/infarct.  . Diabetes mellitus without complication ([place])   . Diabetic neuropathy ([place])   . Dialysis patient [facility])    MWF  . ESRD (end stage renal disease) ([place])    a. 12/2015 initiated - [facility] dialysis.  Marland Kitchen GERD (gastroesophageal reflux disease)   . Hyperlipidemia   . Hypertension   . Hypothyroidism   . Myocardial infarction ([place]) 2015  . Neuropathy   . Pleural effusion 2015  . Pulmonary hypertension ([place])   . Renal insufficiency   . Wears dentures    full lower    Medications:  Scheduled:  . aspirin  81 mg Per Tube Daily  .  chlorhexidine  15 mL Mouth Rinse BID  . Chlorhexidine Gluconate Cloth  6 each Topical Q0600  . epoetin (EPOGEN/PROCRIT) injection  4,000 Units Intravenous Q M,W,F-HD  . feeding supplement (NEPRO CARB STEADY)  237 mL Oral TID BM  . FLUoxetine  20 mg Per Tube Daily  . fluticasone  2 spray Each Nare Daily  . insulin aspart  0-9 Units Subcutaneous TID WC  . levothyroxine  88 mcg Per Tube QAC breakfast  . mouth rinse  15 mL Mouth Rinse q12n4p  . midodrine  10 mg Per Tube TID WC  . multivitamin  1 tablet Oral QHS  . pantoprazole (PROTONIX) IV  40 mg Intravenous Q24H  . venlafaxine  75 mg Per Tube BID WC  . zinc sulfate  220 mg Per Tube Daily    Assessment: 63 y.o. female admitted on 02/09/2019 with MRSA  bacteremia. She was on apixaban PTA but last dose was prior to admission (12/7). [place] 12/15 revealed age-indeterminate short segment occlusive DVT involving the mid humeral aspect of one of the paired brachial veins.  Heparin was started but after repeated attempts by multiple  phlebotomists lab is unable to draw labs required for heparin monitoring, therefore apixaban is being started  Heparin Course 12/15 initiation: 3600 unit bolus, then 1200 units/hr 12/16 0215 HL 0.29, aPTT 109s: dec rate to 1100 units/hr 12/16 2329 aPTT>160s: dec rate to 900 units/hr 12/17 am unable to draw necessary labs  Goal of Therapy:  Monitor platelets by anticoagulation protocol: Yes   Plan:   D/C heparin drip  After one hour start apixaban 10 mg BID for 5 days (she received a full 2 days of heparin therapy), then 2.5 mg BID thereafter  Subsequent 5 mg BID dose is being decreased due to recent bleeding episodes  CBC in am if possible  Dallie Piles, PharmD, BCPS Clinical Pharmacist 02/19/2019 8:19 AM

## 2019-02-19 NOTE — Consult Note (Signed)
Reason for Consult:AMS Referring Physician: Priscella Mann  CC: AMS  HPI: Jamie Arias is an 63 y.o. female with medical history significant ofESRD on HDMWF,anemia, chronic diastolic congestive heart failure, CAD status post PCI, type 2 diabetes, hypertension, hyperlipidemia, hypothyroidism, and conditions listed belowpresenting via EMS after she was found to be somewhat less responsive.  Patient has remained altered during this hospitalization.  Found to have a MRSA bacteremia.  ID following patient.  Etiology of bacteremia unclear.   At baseline patient is basically bed bound.  Uses a hoya to transfer.  Otherwise uses a wheelchair to get around.    Past Medical History:  Diagnosis Date  . Anemia   . Chronic diastolic CHF (congestive heart failure) ([place])    a. Due to ischemic cardiomyopathy. EF as low as 35%, improved to normal s/p CABG; b. echo 07/06/13: EF 55-60%, no RWMAs, mod TR, trivial pericardial effusion not c/w tamponade physiology;  c. 10/2015 Echo: EF 65%, Gr1 DD, triv AI, mild MR, mildly dil LA, mod TR, PASP 25mHg.  .Marland KitchenCoronary artery disease    a. NSTEMI 06/2013; b.cath: severe three-vessel CAD w/ EF 30% & mild-mod MR; c. s/p 3 vessel CABG 07/02/13 (LIMA-LAD, SVG-OM, and SVG-RPDA);  d. 10/2015 MV: no ischemia/infarct.  . Diabetes mellitus without complication ([place]   . Diabetic neuropathy ([place]   . Dialysis patient ([facility]    MWF  . ESRD (end stage renal disease) ([place]    a. 12/2015 initiated - mwf dialysis.  .Marland KitchenGERD (gastroesophageal reflux disease)   . Hyperlipidemia   . Hypertension   . Hypothyroidism   . Myocardial infarction ([place] 2015  . Neuropathy   . Pleural effusion 2015  . Pulmonary hypertension ([place]   . Renal insufficiency   . Wears dentures    full lower    Past Surgical History:  Procedure Laterality Date  . A/V FISTULAGRAM Right 12/17/2016   Procedure: A/V Fistulagram;  Surgeon: DAlgernon Huxley MD;  [facility];  Service: Cardiovascular;   Laterality: Right;  . A/V FISTULAGRAM Right 01/07/2017   Procedure: A/V Fistulagram;  Surgeon: DAlgernon Huxley MD;  [place] [facility];  Service: Cardiovascular;  Laterality: Right;  . A/V FISTULAGRAM Right 12/03/2017   Procedure: A/V FISTULAGRAM;  Surgeon: SKatha Cabal MD;  [place];  Service: Cardiovascular;  Laterality: Right;  . A/V SHUNT INTERVENTION N/A 02/22/2017   Procedure: A/V SHUNT INTERVENTION;  Surgeon: DAlgernon Huxley MD;  [place] [facility];  Service: Cardiovascular;  Laterality: N/A;  . AV FISTULA PLACEMENT Right 02/03/2016   Procedure: INSERTION OF ARTERIOVENOUS (AV) GORE-TEX GRAFT ARM ( BRACH / AXILLARY );  Surgeon: GKatha Cabal MD;  Location: [facility];  Service: Vascular;  Laterality: Right;  . CARDIAC CATHETERIZATION    . CATARACT EXTRACTION Bilateral   . CHOLECYSTECTOMY N/A 12/09/2014   Procedure: LAPAROSCOPIC CHOLECYSTECTOMY;  Surgeon: CMarlyce [name] MD;  Location: [facility];  Service: General;  Laterality: N/A;  . CORONARY ARTERY BYPASS GRAFT N/A 07/02/2013   Procedure: CORONARY ARTERY BYPASS GRAFTING (CABG);  Surgeon: PIvin [name] MD;  [place] [facility]  Service: Open Heart Surgery;  Laterality: N/A;  CABG x three, using left internal mammary artery and right leg greater saphenous vein harvested endoscopically  . ESOPHAGOGASTRODUODENOSCOPY (EGD) WITH PROPOFOL N/A 11/24/2015   Procedure: ESOPHAGOGASTRODUODENOSCOPY (EGD) WITH PROPOFOL;  Surgeon: DLucilla [name] MD;  Location: [place]  Service: Endoscopy;  Laterality: N/A;  Diabetic - insulin  .  EYE SURGERY Bilateral    Cataract Extraction with IOL  . INTRAOPERATIVE TRANSESOPHAGEAL ECHOCARDIOGRAM N/A 07/02/2013   Procedure: INTRAOPERATIVE TRANSESOPHAGEAL ECHOCARDIOGRAM;  Surgeon: Ivin [name], MD;  Location: [place];  Service: Open Heart Surgery;  Laterality: N/A;  . PERIPHERAL VASCULAR CATHETERIZATION Right 12/06/2015   Procedure: Dialysis/Perma Catheter  Insertion;  Surgeon: Katha Cabal, MD;  Location: [place] [facility];  Service: Cardiovascular;  Laterality: Right;  . PERIPHERAL VASCULAR THROMBECTOMY Right 09/28/2016   Procedure: Peripheral Vascular Thrombectomy;  Surgeon: Katha Cabal, MD;  Location: [place] [facility];  Service: Cardiovascular;  Laterality: Right;  . PERIPHERAL VASCULAR THROMBECTOMY Right 05/20/2018   Procedure: PERIPHERAL VASCULAR THROMBECTOMY;  Surgeon: [place] Cabal, MD;  Location: [place] [facility];  Service: Cardiovascular;  Laterality: Right;  . PORTA CATH REMOVAL N/A 06/01/2016   Procedure: Glori [name] Cath Removal;  Surgeon: [place]a Cabal, MD;  Location: [place] [facility];  Service: Cardiovascular;  Laterality: N/A;  . THORACENTESIS Left 2015    Family History  Problem Relation Age of Onset  . COPD Mother   . Cancer Mother        Lung  . Pulmonary embolism Father   . Diabetes Father   . Diabetes Paternal Grandfather   . Heart disease Maternal Grandmother   . Colon cancer Neg Hx   . Colon polyps Neg Hx   . Esophageal cancer Neg Hx   . Pancreatic cancer Neg Hx   . Liver disease Neg Hx     Social History:  reports that she has never smoked. She has never used smokeless tobacco. She reports that she does not drink alcohol or use drugs.  Allergies  Allergen Reactions  . Nsaids Other (See Comments)    Contraindicated due to kidney disease.  Marland Kitchen Doxycycline Other (See Comments)    tremor    Medications:  I have reviewed the patient's current medications. Prior to Admission:  Medications Prior to Admission  Medication Sig Dispense Refill Last Dose  . ACCU-CHEK FASTCLIX LANCETS MISC Check blood glucose twice daily, diagnosis code E11.9 204 each 3   . acetaminophen (TYLENOL) 325 MG tablet Take 650 mg by mouth daily as needed for moderate pain or headache.    Unknown at PRN  . Alirocumab (PRALUENT) 75 MG/ML SOAJ Inject 75 mg into the skin every 14 (fourteen) days. 2 pen 11 As directed  at As directed  . Amino Acids-Protein Hydrolys (FEEDING SUPPLEMENT, PRO-STAT SUGAR FREE 64,) LIQD Take 30 mLs by mouth 3 (three) times daily between meals. 887 mL 0   . amiodarone (PACERONE) 200 MG tablet Take 200 mg by mouth daily.   24+ hours at Unknown  . aspirin EC 81 MG tablet Take 81 mg by mouth daily.   24+ hours at Unknown  . atorvastatin (LIPITOR) 40 MG tablet Take 40 mg by mouth daily.   24+ hours at Unknown  . Blood Glucose Monitoring Suppl (ACCU-CHEK AVIVA PLUS) w/Device KIT Use as directed to test blood sugar up to three times daily 1 kit 0   . Calcium Acetate 667 MG TABS Take 2,001 mg by mouth 3 (three) times daily with meals.   24+ hours at Unknown  . ELIQUIS 5 MG TABS tablet Take 5 mg by mouth 2 (two) times daily.    24+ hours at Unknown  . ferrous sulfate 325 (65 FE) MG tablet Take 325 mg by mouth daily with breakfast.   24+ hours at Unknown  . FLUoxetine (PROZAC) 20 MG  capsule Take 20 mg by mouth daily.   24+ hours at Unknown  . fluticasone (FLONASE) 50 MCG/ACT nasal spray Place 2 sprays into both nostrils daily. (Patient taking differently: Place 2 sprays into both nostrils daily as needed for allergies or rhinitis. ) 16 g 6 Unknown at PRN  . furosemide (LASIX) 20 MG tablet Take 40 mg by mouth every other day. Non-dialysis days    As directed at As directed  . gabapentin (NEURONTIN) 300 MG capsule Take 300 mg by mouth 2 (two) times daily.    24+ hours at Unknown  . glucose blood (ACCU-CHEK AVIVA) test strip Use as instructed to test blood sugar up to 3 times daily 100 each 12   . Insulin Glargine (BASAGLAR KWIKPEN) 100 UNIT/ML SOPN Inject 18 Units into the skin daily.   24+ hours at Unknown  . Lancets (ACCU-CHEK SOFT TOUCH) lancets Use as instructed 100 each 12   . levothyroxine (SYNTHROID) 88 MCG tablet Take 88 mcg by mouth daily before breakfast.   24+ hours at Unknown  . lidocaine-prilocaine (EMLA) cream Apply 1 application topically as needed (port access).   Unknown at PRN   . midodrine (PROAMATINE) 10 MG tablet Take 1 tablet (10 mg total) by mouth 3 (three) times daily with meals. (Patient taking differently: Take 10 mg by mouth 3 (three) times daily as needed (low BP). )   Unknown at PRN  . multivitamin (RENA-VIT) TABS tablet Take 1 tablet by mouth at bedtime.  0 24+ hours at Unknown  . nitroGLYCERIN (NITROSTAT) 0.4 MG SL tablet DISSOLVE ONE TABLET UNDER THE TONGUE EVERY 5 MINUTES AS NEEDED FOR CHEST PAIN.  DO NOT EXCEED A TOTAL OF 3 DOSES IN 15 MINUTES (Patient taking differently: Place 0.4 mg under the tongue every 5 (five) minutes as needed for chest pain. DISSOLVE ONE TABLET UNDER THE TONGUE EVERY 5 MINUTES AS NEEDED FOR CHEST PAIN.  DO NOT EXCEED A TOTAL OF 3 DOSES IN 15 MINUTES) 25 tablet 0 Unknown at PRN  . ondansetron (ZOFRAN-ODT) 4 MG disintegrating tablet Take 1 tablet (4 mg total) by mouth every 8 (eight) hours as needed for nausea or vomiting. appt with PCP further refills Dr. Caryl Bis 30 tablet 0 Unknown at PRN  . pantoprazole (PROTONIX) 40 MG tablet Take 40 mg by mouth daily.   24+ hours at Unknown  . vitamin C (VITAMIN C) 500 MG tablet Take 1 tablet (500 mg total) by mouth daily.     Marland Kitchen zinc sulfate 220 (50 Zn) MG capsule Take 1 capsule (220 mg total) by mouth daily.     . mirtazapine (REMERON) 15 MG tablet Take 1 tablet (15 mg total) by mouth at bedtime. (Patient not taking: Reported on 02/09/2019) 30 tablet 1 Not Taking at Unknown time   Scheduled: . aspirin  81 mg Per Tube Daily  . chlorhexidine  15 mL Mouth Rinse BID  . Chlorhexidine Gluconate Cloth  6 each Topical Q0600  . epoetin (EPOGEN/PROCRIT) injection  4,000 Units Intravenous Q M,W,F-HD  . feeding supplement (NEPRO CARB STEADY)  237 mL Oral TID BM  . FLUoxetine  20 mg Per Tube Daily  . fluticasone  2 spray Each Nare Daily  . insulin aspart  0-9 Units Subcutaneous TID WC  . levothyroxine  88 mcg Per Tube QAC breakfast  . mouth rinse  15 mL Mouth Rinse q12n4p  . midodrine  10 mg Per Tube  TID WC  . multivitamin  1 tablet Oral QHS  . pantoprazole (  PROTONIX) IV  40 mg Intravenous Q24H  . venlafaxine  75 mg Per Tube BID WC  . zinc sulfate  220 mg Per Tube Daily    ROS: Unable to provide due to mental status  Physical Examination: Blood pressure (!) 150/65, pulse 77, temperature 98.5 F (36.9 C), temperature source Oral, resp. rate 20, height '5\' 4"'$  (1.626 m), weight 74.9 kg, SpO2 100 %.  HEENT-  Normocephalic, no lesions, without obvious abnormality.  Normal external eye and conjunctiva.  Normal TM's bilaterally.  Normal auditory canals and external ears. Normal external nose, mucus membranes and septum.  Normal pharynx. Cardiovascular- S1, S2 normal, pulses palpable throughout   Lungs- chest clear, no wheezing, rales, normal symmetric air entry Abdomen- soft, non-tender; bowel sounds normal; no masses,  no organomegaly Extremities- edematous extremities Lymph-no adenopathy palpable Musculoskeletal-no joint tenderness, deformity or swelling Skin-sore on heel  Neurological Examination   Mental Status: Lethargic but able to be aroused.  Answers yes and no to questions appropriately.  A few full sentences made.  Follows simple commands.   Cranial Nerves: II: Blinks to bilateral confrontation, pupils equal, round, reactive to light and accommodation III,IV, VI: ptosis not present, extra-ocular motions intact bilaterally V,VII: smile symmetric, facial light touch sensation normal bilaterally VIII: hearing normal bilaterally IX,X: gag reflex present XI: bilateral shoulder shrug XII: midline tongue extension Motor: Generalized weakness, lower extremities more than upper extremities Sensory: Pinprick and light touch intact throughout, bilaterally Deep Tendon Reflexes: Symmetric throughout Plantars: Right: mute   Left: mute Cerebellar: Unable to perform due to generalized weakness Gait: not tested due to safety concerns   Laboratory Studies:   Basic Metabolic  Panel: Recent Labs  Lab 02/13/19 0453 02/14/19 0340 02/15/19 0545 02/16/19 0433 02/17/19 0533 02/18/19 0215 02/18/19 1200  NA 143 142 138 139 139 137  --   K 3.2* 3.1* 3.6 3.4* 4.0 4.0  --   CL 103 104 102 102 102 100  --   CO2 '27 26 24 24 25 23  '$ --   GLUCOSE 108* 109* 96 166* 101* 128*  --   BUN 17 23 26* 30* 14 18  --   CREATININE 3.31* 4.03* 4.64* 5.30* 3.27* 3.95*  --   CALCIUM 8.4* 8.0* 7.9* 8.2* 8.1* 8.0*  --   MG 2.3 2.2 2.2 1.9  --  1.7  --   PHOS 1.7* 2.2* 2.2* 2.5 1.8* 2.7 3.0    Liver Function Tests: Recent Labs  Lab 02/13/19 0453 02/14/19 0340 02/16/19 0433 02/17/19 0533  ALBUMIN 2.2* 2.2* 2.1* 2.0*   No results for input(s): LIPASE, AMYLASE in the last 168 hours. Recent Labs  Lab 02/17/19 0533 02/18/19 1334  AMMONIA 16 10    CBC: Recent Labs  Lab 02/13/19 0453 02/14/19 0340 02/15/19 0545 02/15/19 1824 02/16/19 0433 02/18/19 0215  WBC 6.9 8.0 8.9  --  8.9 10.6*  HGB 7.9* 7.2* 6.8* 8.8* 8.7* 9.4*  HCT 24.7* 22.2* 20.0* 25.0* 24.7* 28.4*  MCV 86.1 83.8 80.3  --  79.7* 84.3  PLT 107* 106* 105*  --  126* 177    Cardiac Enzymes: Recent Labs  Lab 02/16/19 0433  CKTOTAL 17*    BNP: Invalid input(s): POCBNP  CBG: Recent Labs  Lab 02/17/19 2300 02/18/19 0810 02/18/19 1705 02/18/19 2107 02/19/19 0746  GLUCAP 122* 163* 39 103* 171*    Microbiology: Results for orders placed or performed during the hospital encounter of 02/09/19  Culture, blood (routine x 2)     Status:  Abnormal   Collection Time: 02/09/19  2:29 PM   Specimen: BLOOD LEFT ARM  Result Value Ref Range Status   Specimen Description   Final    BLOOD LEFT ARM Performed at [facility], [address], [place], [place] [address]    Special Requests   Final    BOTTLES DRAWN AEROBIC AND ANAEROBIC Blood Culture adequate volume Performed at [facility], [address] [place], [place] [address]    Culture  Setup Time   Final    GRAM POSITIVE  COCCI IN BOTH AEROBIC AND ANAEROBIC BOTTLES CRITICAL VALUE NOTED.  VALUE IS CONSISTENT WITH PREVIOUSLY REPORTED AND CALLED VALUE. Performed at [facility], [address] [place], [place] [address]    Culture (A)  Final    STAPHYLOCOCCUS AUREUS SUSCEPTIBILITIES PERFORMED ON PREVIOUS CULTURE WITHIN THE LAST 5 DAYS. Performed at [facility], [place] [address] [place], [place] [address]    Report Status 02/12/2019 FINAL  Final  Culture, blood (routine x 2)     Status: Abnormal   Collection Time: 02/09/19  2:38 PM   Specimen: BLOOD LEFT ARM  Result Value Ref Range Status   Specimen Description   Final    BLOOD LEFT ARM Performed at [facility], [address] [place], [place] [address]    Special Requests   Final    BOTTLES DRAWN AEROBIC AND ANAEROBIC Blood Culture adequate volume Performed at [facility], [address] [place], [place] [address]    Culture  Setup Time   Final    GRAM POSITIVE COCCI IN BOTH AEROBIC AND ANAEROBIC BOTTLES CRITICAL RESULT CALLED TO, READ BACK BY AND VERIFIED WITH: [place] [id number] 02/10/2019 HNM Performed at [facility], [place] [address], [place], [place] [address]    Culture METHICILLIN RESISTANT STAPHYLOCOCCUS AUREUS (A)  Final   Report Status 02/12/2019 FINAL  Final   Organism ID, Bacteria METHICILLIN RESISTANT STAPHYLOCOCCUS AUREUS  Final      Susceptibility   Methicillin resistant staphylococcus aureus - MIC*    CIPROFLOXACIN >=8 RESISTANT Resistant     ERYTHROMYCIN >=8 RESISTANT Resistant     GENTAMICIN <=0.5 SENSITIVE Sensitive     OXACILLIN >=4 RESISTANT Resistant     TETRACYCLINE <=1 SENSITIVE Sensitive     VANCOMYCIN 1 SENSITIVE Sensitive     TRIMETH/SULFA <=10 SENSITIVE Sensitive     CLINDAMYCIN RESISTANT Resistant     RIFAMPIN <=0.5 SENSITIVE Sensitive     Inducible Clindamycin POSITIVE Resistant     * METHICILLIN RESISTANT STAPHYLOCOCCUS AUREUS  Blood Culture ID Panel (Reflexed)      Status: Abnormal   Collection Time: 02/09/19  2:38 PM  Result Value Ref Range Status   Enterococcus species NOT DETECTED NOT DETECTED Final   Listeria monocytogenes NOT DETECTED NOT DETECTED Final   Staphylococcus species DETECTED (A) NOT DETECTED Final    Comment: CRITICAL RESULT CALLED TO, READ BACK BY AND VERIFIED WITH: Hart Robinsons PHARMD [id number] 02/10/2019 HNM    Staphylococcus aureus (BCID) DETECTED (A) NOT DETECTED Final    Comment: Methicillin (oxacillin)-resistant Staphylococcus aureus (MRSA). MRSA is predictably resistant to beta-lactam antibiotics (except ceftaroline). Preferred therapy is vancomycin unless clinically contraindicated. Patient requires contact precautions if  hospitalized. CRITICAL RESULT CALLED TO, READ BACK BY AND VERIFIED WITH: Hart Robinsons PHARMD [id number] 02/10/2019 HNM    Methicillin resistance DETECTED (A) NOT DETECTED Final    Comment: CRITICAL RESULT CALLED TO, READ BACK BY AND VERIFIED WITH: SCOTT HALL  PHARMD 0556 02/10/2019 HNM    Streptococcus species NOT DETECTED NOT DETECTED Final   Streptococcus agalactiae NOT DETECTED NOT DETECTED Final   Streptococcus pneumoniae NOT DETECTED NOT DETECTED Final   Streptococcus pyogenes NOT DETECTED NOT DETECTED Final   Acinetobacter baumannii NOT DETECTED NOT DETECTED Final   Enterobacteriaceae species NOT DETECTED NOT DETECTED Final   Enterobacter cloacae complex NOT DETECTED NOT DETECTED Final   Escherichia coli NOT DETECTED NOT DETECTED Final   Klebsiella oxytoca NOT DETECTED NOT DETECTED Final   Klebsiella pneumoniae NOT DETECTED NOT DETECTED Final   Proteus species NOT DETECTED NOT DETECTED Final   Serratia marcescens NOT DETECTED NOT DETECTED Final   Haemophilus influenzae NOT DETECTED NOT DETECTED Final   Neisseria meningitidis NOT DETECTED NOT DETECTED Final   Pseudomonas aeruginosa NOT DETECTED NOT DETECTED Final   Candida albicans NOT DETECTED NOT DETECTED Final   Candida glabrata NOT DETECTED NOT DETECTED  Final   Candida krusei NOT DETECTED NOT DETECTED Final   Candida parapsilosis NOT DETECTED NOT DETECTED Final   Candida tropicalis NOT DETECTED NOT DETECTED Final    Comment: Performed at [facility], [place], [place] [address]  SARS CORONAVIRUS 2 (TAT 6-24 HRS) Nasopharyngeal Nasopharyngeal Swab     Status: None   Collection Time: 02/09/19  2:54 PM   Specimen: Nasopharyngeal Swab  Result Value Ref Range Status   SARS Coronavirus 2 NEGATIVE NEGATIVE Final    Comment: (NOTE) SARS-CoV-2 target nucleic acids are NOT DETECTED. The SARS-CoV-2 RNA is generally detectable in upper and lower respiratory specimens during the acute phase of infection. Negative results do not preclude SARS-CoV-2 infection, do not rule out co-infections with other pathogens, and should not be used as the sole basis for treatment or other patient management decisions. Negative results must be combined with clinical observations, patient history, and epidemiological information. The expected result is Negative. Fact Sheet for Patients: SugarRoll.be Fact Sheet for Healthcare Providers: https://www.woods-mathews.com/ This test is not yet approved or cleared by the [place] FDA and  has been authorized for detection and/or diagnosis of SARS-CoV-2 by FDA under an Emergency Use Authorization (EUA). This EUA will remain  in effect (meaning this test can be used) for the duration of the COVID-19 declaration under Section 56 4(b)(1) of the Act, 21 U.S.C. section 360bbb-3(b)(1), unless the authorization is terminated or revoked sooner. Performed at [facility], [place] [address], [place], [place] [address]   MRSA PCR Screening     Status: Abnormal   Collection Time: 02/10/19  5:03 PM   Specimen: Nasopharyngeal  Result Value Ref Range Status   MRSA by PCR POSITIVE (A) NEGATIVE Final    Comment:        The GeneXpert MRSA Assay (FDA approved  for NASAL specimens only), is one component of a comprehensive MRSA colonization surveillance program. It is not intended to diagnose MRSA infection nor to guide or monitor treatment for MRSA infections. RESULT CALLED TO, READ BACK BY AND VERIFIED WITH: ALEKSEY FRASER '@1839'$  02/10/19 MJU Performed at [facility], [address] [place], [place] [address]   Culture, blood (routine x 2)     Status: Abnormal   Collection Time: 02/11/19 12:31 PM   Specimen: BLOOD  Result Value Ref Range Status   Specimen Description   Final    BLOOD PORTA CATH Performed at [place] [address] [place], [place] [address]    Special Requests   Final  BOTTLES DRAWN AEROBIC AND ANAEROBIC Blood Culture adequate volume Performed at [facility], [address] [place], [place] [address]    Culture  Setup Time   Final    GRAM POSITIVE COCCI ANAEROBIC BOTTLE ONLY CRITICAL RESULT CALLED TO, READ BACK BY AND VERIFIED WITH: SCOTT HALL AT 9826 02/12/2019 [facility]  Performed at [facility], [address] [place], [place] [address]    Culture (A)  Final    STAPHYLOCOCCUS AUREUS SUSCEPTIBILITIES PERFORMED ON PREVIOUS CULTURE WITHIN THE LAST 5 DAYS. Performed at [facility], [place] [address], [place], [place] [address]    Report Status 02/13/2019 FINAL  Final  Culture, blood (routine x 2)     Status: None   Collection Time: 02/11/19  3:57 PM   Specimen: BLOOD  Result Value Ref Range Status   Specimen Description BLOOD PORTA CATH  Final   Special Requests   Final    BOTTLES DRAWN AEROBIC AND ANAEROBIC Blood Culture adequate volume   Culture   Final    NO GROWTH 5 DAYS Performed at [facility], [address] [place], [place] [address]    Report Status 02/16/2019 FINAL  Final  CULTURE, BLOOD (ROUTINE X 2) w Reflex to ID Panel     Status: Abnormal   Collection Time: 02/13/19  2:18 PM   Specimen: BLOOD LEFT HAND  Result Value Ref Range Status    Specimen Description BLOOD LEFT HAND  Final   Special Requests   Final    BOTTLES DRAWN AEROBIC AND ANAEROBIC Blood Culture results may not be optimal due to an inadequate volume of blood received in culture bottles   Culture  Setup Time   Final    AEROBIC BOTTLE ONLY GRAM POSITIVE COCCI IN CLUSTERS CRITICAL RESULT CALLED TO, READ BACK BY AND VERIFIED WITH: Herbert Pun AT 1103 ON 02/14/2019 [place]. Performed at [facility], [place] [address], [place], [place] [address]    Culture METHICILLIN RESISTANT STAPHYLOCOCCUS AUREUS (A)  Final   Report Status 02/16/2019 FINAL  Final   Organism ID, Bacteria METHICILLIN RESISTANT STAPHYLOCOCCUS AUREUS  Final      Susceptibility   Methicillin resistant staphylococcus aureus - MIC*    CIPROFLOXACIN >=8 RESISTANT Resistant     ERYTHROMYCIN >=8 RESISTANT Resistant     GENTAMICIN <=0.5 SENSITIVE Sensitive     OXACILLIN >=4 RESISTANT Resistant     TETRACYCLINE <=1 SENSITIVE Sensitive     VANCOMYCIN <=0.5 SENSITIVE Sensitive     TRIMETH/SULFA <=10 SENSITIVE Sensitive     CLINDAMYCIN RESISTANT Resistant     RIFAMPIN <=0.5 SENSITIVE Sensitive     Inducible Clindamycin POSITIVE Resistant     * METHICILLIN RESISTANT STAPHYLOCOCCUS AUREUS  Blood Culture ID Panel (Reflexed)     Status: Abnormal   Collection Time: 02/13/19  2:18 PM  Result Value Ref Range Status   Enterococcus species NOT DETECTED NOT DETECTED Final   Listeria monocytogenes NOT DETECTED NOT DETECTED Final   Staphylococcus species DETECTED (A) NOT DETECTED Final    Comment: CRITICAL RESULT CALLED TO, READ BACK BY AND VERIFIED WITH: ABBEY ELLINGTON AT 8592 ON 02/14/2019 [place].    Staphylococcus aureus (BCID) DETECTED (A) NOT DETECTED Final    Comment: Methicillin (oxacillin)-resistant Staphylococcus aureus (MRSA). MRSA is predictably resistant to beta-lactam antibiotics (except ceftaroline). Preferred therapy is vancomycin unless clinically contraindicated. Patient requires contact  precautions if  hospitalized. CRITICAL RESULT CALLED TO, READ BACK BY AND VERIFIED WITH: Herbert Pun AT 9244 ON 02/14/2019  [place].    Methicillin resistance DETECTED (A) NOT DETECTED Final    Comment: CRITICAL RESULT CALLED TO, READ BACK BY AND VERIFIED WITH: Herbert Pun AT 6734 ON 02/14/2019 [place].    Streptococcus species NOT DETECTED NOT DETECTED Final   Streptococcus agalactiae NOT DETECTED NOT DETECTED Final   Streptococcus pneumoniae NOT DETECTED NOT DETECTED Final   Streptococcus pyogenes NOT DETECTED NOT DETECTED Final   Acinetobacter baumannii NOT DETECTED NOT DETECTED Final   Enterobacteriaceae species NOT DETECTED NOT DETECTED Final   Enterobacter cloacae complex NOT DETECTED NOT DETECTED Final   Escherichia coli NOT DETECTED NOT DETECTED Final   Klebsiella oxytoca NOT DETECTED NOT DETECTED Final   Klebsiella pneumoniae NOT DETECTED NOT DETECTED Final   Proteus species NOT DETECTED NOT DETECTED Final   Serratia marcescens NOT DETECTED NOT DETECTED Final   Haemophilus influenzae NOT DETECTED NOT DETECTED Final   Neisseria meningitidis NOT DETECTED NOT DETECTED Final   Pseudomonas aeruginosa NOT DETECTED NOT DETECTED Final   Candida albicans NOT DETECTED NOT DETECTED Final   Candida glabrata NOT DETECTED NOT DETECTED Final   Candida krusei NOT DETECTED NOT DETECTED Final   Candida parapsilosis NOT DETECTED NOT DETECTED Final   Candida tropicalis NOT DETECTED NOT DETECTED Final    Comment: Performed at [facility], [address] [place], [place] [address]  CULTURE, BLOOD (ROUTINE X 2) w Reflex to ID Panel     Status: None   Collection Time: 02/13/19  3:09 PM   Specimen: BLOOD  Result Value Ref Range Status   Specimen Description BLOOD BLOOD LEFT HAND  Final   Special Requests   Final    BOTTLES DRAWN AEROBIC AND ANAEROBIC Blood Culture adequate volume   Culture   Final    NO GROWTH 5 DAYS Performed at [facility], [address]  [place], [place] [address]    Report Status 02/18/2019 FINAL  Final  Culture, blood (routine x 2)     Status: Abnormal   Collection Time: 02/15/19 12:49 PM   Specimen: BLOOD  Result Value Ref Range Status   Specimen Description   Final    BLOOD LT HAND Performed at [facility], [address] [place], [place] [address]    Special Requests   Final    BOTTLES DRAWN AEROBIC AND ANAEROBIC Blood Culture results may not be optimal due to an inadequate volume of blood received in culture bottles Performed at [facility], [address] [place], [place] [address]    Culture  Setup Time   Final    GRAM POSITIVE COCCI IN BOTH AEROBIC AND ANAEROBIC BOTTLES CRITICAL RESULT CALLED TO, READ BACK BY AND VERIFIED WITHEleonore Chiquito AT [id number] 02/16/2019 SDR Performed at [facility], [address] [place], [place] [address]    Culture (A)  Final    METHICILLIN RESISTANT STAPHYLOCOCCUS AUREUS STAPHYLOCOCCUS SPECIES (COAGULASE NEGATIVE) THE SIGNIFICANCE OF ISOLATING THIS ORGANISM FROM A SINGLE SET OF BLOOD CULTURES WHEN MULTIPLE SETS ARE DRAWN IS UNCERTAIN. PLEASE NOTIFY THE MICROBIOLOGY DEPARTMENT WITHIN ONE WEEK IF SPECIATION AND SENSITIVITIES ARE REQUIRED. Performed at [facility], [place] [address], [place], [place] [address]    Report Status 02/18/2019 FINAL  Final   Organism ID, Bacteria METHICILLIN RESISTANT STAPHYLOCOCCUS AUREUS  Final      Susceptibility   Methicillin resistant staphylococcus aureus - MIC*    CIPROFLOXACIN >=8 RESISTANT Resistant     ERYTHROMYCIN >=8 RESISTANT Resistant     GENTAMICIN <=0.5 SENSITIVE  Sensitive     OXACILLIN >=4 RESISTANT Resistant     TETRACYCLINE <=1 SENSITIVE Sensitive     VANCOMYCIN <=0.5 SENSITIVE Sensitive     TRIMETH/SULFA <=10 SENSITIVE Sensitive     CLINDAMYCIN RESISTANT Resistant     RIFAMPIN <=0.5 SENSITIVE Sensitive     Inducible Clindamycin POSITIVE Resistant     * METHICILLIN RESISTANT STAPHYLOCOCCUS AUREUS   Aerobic Culture (superficial specimen)     Status: None   Collection Time: 02/17/19  1:31 PM   Specimen: Heel; Wound  Result Value Ref Range Status   Specimen Description   Final    HEEL Performed at [facility], [address], [place], [place] [address]    Special Requests   Final    NONE Performed at [facility], [address] [place], [place] [address]    Gram Stain   Final    NO WBC SEEN MODERATE GRAM POSITIVE COCCI IN PAIRS    Culture   Final    ABUNDANT METHICILLIN RESISTANT STAPHYLOCOCCUS AUREUS ABUNDANT DIPHTHEROIDS(CORYNEBACTERIUM SPECIES) Standardized susceptibility testing for this organism is not available. Performed at [facility], [place] [address], [place], [place] [address]    Report Status 02/19/2019 FINAL  Final   Organism ID, Bacteria METHICILLIN RESISTANT STAPHYLOCOCCUS AUREUS  Final      Susceptibility   Methicillin resistant staphylococcus aureus - MIC*    CIPROFLOXACIN >=8 RESISTANT Resistant     ERYTHROMYCIN >=8 RESISTANT Resistant     GENTAMICIN <=0.5 SENSITIVE Sensitive     OXACILLIN >=4 RESISTANT Resistant     TETRACYCLINE <=1 SENSITIVE Sensitive     VANCOMYCIN 1 SENSITIVE Sensitive     TRIMETH/SULFA <=10 SENSITIVE Sensitive     CLINDAMYCIN RESISTANT Resistant     RIFAMPIN <=0.5 SENSITIVE Sensitive     Inducible Clindamycin POSITIVE Resistant     * ABUNDANT METHICILLIN RESISTANT STAPHYLOCOCCUS AUREUS  CULTURE, BLOOD (ROUTINE X 2) w Reflex to ID Panel     Status: None (Preliminary result)   Collection Time: 02/18/19 11:29 PM   Specimen: BLOOD  Result Value Ref Range Status   Specimen Description BLOOD LEFT ARM  Final   Special Requests   Final    BOTTLES DRAWN AEROBIC AND ANAEROBIC Blood Culture adequate volume   Culture   Final    NO GROWTH < 12 HOURS Performed at [facility], [address] [place], [place] [address]    Report Status PENDING  Incomplete  CULTURE, BLOOD (ROUTINE X 2) w Reflex to ID  Panel     Status: None (Preliminary result)   Collection Time: 02/18/19 11:29 PM   Specimen: BLOOD  Result Value Ref Range Status   Specimen Description BLOOD LEFT ARM  Final   Special Requests   Final    BOTTLES DRAWN AEROBIC AND ANAEROBIC Blood Culture adequate volume   Culture   Final    NO GROWTH < 12 HOURS Performed at [facility], [place] [place],  [address]    Report Status PENDING  Incomplete    Coagulation Studies: Recent Labs    02/17/19 05-11-2009  LABPROT 15.6*  INR 1.3*    Urinalysis: No results for input(s): COLORURINE, LABSPEC, PHURINE, GLUCOSEU, HGBUR, BILIRUBINUR, KETONESUR, PROTEINUR, UROBILINOGEN, NITRITE, LEUKOCYTESUR in the last 168 hours.  Invalid input(s): APPERANCEUR  Lipid Panel:     Component Value Date/Time   CHOL 153 02/11/2018 1404   TRIG 294 (H) 02/11/2018 1404   HDL 43 02/11/2018 1404   CHOLHDL  3.6 02/11/2018 1404   CHOLHDL 3.4 11/07/2017 0950   VLDL 22 11/07/2017 0950   LDLCALC 51 02/11/2018 1404    HgbA1C:  Lab Results  Component Value Date   HGBA1C 5.3 02/09/2019    Urine Drug Screen:  No results found for: LABOPIA, COCAINSCRNUR, LABBENZ, AMPHETMU, THCU, LABBARB  Alcohol Level: No results for input(s): ETH in the last 168 hours.  Other results: EKG (02/10/2019): Normal sinus rhythm at 97 bpm.  Imaging: CT CERVICAL SPINE W CONTRAST  Result Date: 02/17/2019 CLINICAL DATA:  Persistent MRSA bacteremia EXAM: CT CERVICAL SPINE WITH CONTRAST TECHNIQUE: Multidetector CT imaging of the cervical spine was performed during intravenous contrast administration. Multiplanar CT image reconstructions were also generated. CONTRAST:  180m OMNIPAQUE IOHEXOL 300 MG/ML  SOLN COMPARISON:  2019 MRI FINDINGS: Alignment: Preserved. Skull base and vertebrae: Vertebral body heights are maintained. There is no cortical destruction. Soft tissues and spinal canal: No prevertebral soft tissue swelling. No evidence of an epidural collection  within limitation of artifact obscuring the spinal canal. Disc levels: Multilevel degenerative changes are present without high-grade canal or foraminal stenosis. Upper chest: Partially imaged pleural effusions. Other: None. IMPRESSION: No evidence of discitis/osteomyelitis or collection. Electronically Signed   By: PMacy MisM.D.   On: 02/17/2019 15:57   CT THORACIC SPINE W CONTRAST  Result Date: 02/17/2019 CLINICAL DATA:  MRSA bacteremia EXAM: CT THORACIC SPINE WITH CONTRAST TECHNIQUE: Multidetector CT images of thoracic was performed according to the standard protocol following intravenous contrast administration. CONTRAST:  1089mOMNIPAQUE IOHEXOL 300 MG/ML  SOLN COMPARISON:  None. FINDINGS: Alignment: Preserved. Vertebrae: Vertebral body heights are maintained. There is no cortical destruction. Paraspinal and other soft tissues: Bilateral pleural effusions and bibasilar atelectasis/consolidation. Partially imaged cardiomegaly. Enlarged main pulmonary artery suggesting pulmonary arterial hypertension. Disc levels: Mild multilevel degenerative changes. There is no high-grade osseous encroachment on the spinal canal or neural foramina. IMPRESSION: No evidence of discitis/osteomyelitis in the thoracic spine. No collection. Bilateral pleural effusions and bibasilar atelectasis/consolidation. Electronically Signed   By: PrMacy Mis.D.   On: 02/17/2019 16:17   CT LUMBAR SPINE W CONTRAST  Result Date: 02/17/2019 CLINICAL DATA:  MRSA bacteremia EXAM: CT LUMBAR SPINE WITH CONTRAST TECHNIQUE: Multidetector CT imaging of the lumbar spine was performed with intravenous contrast administration. CONTRAST:  10034mMNIPAQUE IOHEXOL 300 MG/ML  SOLN COMPARISON:  Via FINDINGS: Segmentation: 5 lumbar type vertebrae. Alignment: Normal. Vertebrae: Vertebral body heights are maintained. There is no cortical destruction. Paraspinal and other soft tissues: Mild presacral edema. No paraspinal collection. Disc levels:  There is disc space narrowing and vacuum phenomenon at L5-S1. Multilevel facet hypertrophy is present. Left foraminal stenosis is present at L5-S1. No high-grade canal stenosis with suboptimal evaluation of the spinal canal due to artifact. IMPRESSION: No evidence of discitis/osteomyelitis in the lumbar spine. Nonspecific mild presacral edema. No paraspinal collection. Electronically Signed   By: PraMacy MisD.   On: 02/17/2019 16:10   DG Foot 2 Views Left  Result Date: 02/18/2019 CLINICAL DATA:  Left heel wound.  Rule out osteomyelitis. EXAM: LEFT FOOT - 2 VIEW COMPARISON:  None. FINDINGS: Plantar calcaneal spur. No bone destruction or lucency to suggest osteomyelitis. No acute bony abnormality. Specifically, no fracture, subluxation, or dislocation. Vascular calcifications. IMPRESSION: Calcaneal spur.  No evidence of osteomyelitis. Electronically Signed   By: KevRolm BaptiseD.   On: 02/18/2019 18:18     Assessment/Plan: [age]o. female with medical history significant ofESRD on HDMWF,anemia, chronic diastolic congestive heart failure, CAD  status post PCI, type 2 diabetes, hypertension, hyperlipidemia, hypothyroidism, and conditions listed belowpresenting via EMS after she was found to be somewhat less responsive.  Patient has remained altered during this hospitalization.  Found to have a MRSA bacteremia.  ID following patient.  Etiology of bacteremia unclear.  White blood cell count initially improved but now worsening.  MRI of the brain reviewed and shows no acute changes.  EEG significant for changes consistent with encephalopathy.  CT of the cervical, thoracic and lumbar spines show no source of infection.  TTE unremarkable.  TEE pending.  Does not appear to be a CNS cause of infection.  If concern for meningitis, organism likely that isolated in blood cultures, therefore addressing this should be appropriate treatment.  LP would no likely provide additional benefit.    Recommendations: 1.  Agree with current management.  Case discussed with Dr. Priscella Mann.  If at some point in time ID feels that LP would be beneficial please call our service back at that time.    Alexis Goodell, MD Neurology [phone] 02/19/2019, 11:40 AM

## 2019-02-19 NOTE — Progress Notes (Signed)
Date of Admission:  02/09/2019     ID: EULAH FRUTOS is a 63 y.o. female  Principal Problem:   Sepsis due to methicillin resistant Staphylococcus aureus (MRSA) ([place]) Active Problems:   Diabetes mellitus, type 2 (HCC)   Hypotension   Chronic diastolic CHF (congestive heart failure) (HCC)   ESRD (end stage renal disease) ([place])   Altered level of consciousness   Pressure injury of skin   MRSA bacteremia   Arm DVT (deep venous thromboembolism), acute, left (HCC)   Acute encephalopathy    Subjective: Drowsy and lethargic No History  Medications:  . [START ON 02/24/2019] apixaban  2.5 mg Oral BID   Followed by  . apixaban  10 mg Oral BID  . aspirin  81 mg Per Tube Daily  . chlorhexidine  15 mL Mouth Rinse BID  . Chlorhexidine Gluconate Cloth  6 each Topical Q0600  . epoetin (EPOGEN/PROCRIT) injection  4,000 Units Intravenous Q M,W,F-HD  . feeding supplement (NEPRO CARB STEADY)  237 mL Oral TID BM  . FLUoxetine  20 mg Per Tube Daily  . fluticasone  2 spray Each Nare Daily  . insulin aspart  0-9 Units Subcutaneous TID WC  . levothyroxine  88 mcg Per Tube QAC breakfast  . mouth rinse  15 mL Mouth Rinse q12n4p  . midodrine  10 mg Per Tube TID WC  . multivitamin  1 tablet Oral QHS  . pantoprazole (PROTONIX) IV  40 mg Intravenous Q24H  . venlafaxine  75 mg Per Tube BID WC  . zinc sulfate  220 mg Per Tube Daily    Objective: Vital signs in last 24 hours: Temp:  [97.7 F (36.5 C)-98.9 F (37.2 C)] 97.7 F (36.5 C) (12/17 1533) Pulse Rate:  [64-86] 75 (12/17 1533) Resp:  [18-20] 18 (12/17 1533) BP: (147-184)/(59-116) 156/116 (12/17 1533) SpO2:  [93 %-100 %] 97 % (12/17 1533)  PHYSICAL EXAM:  General: drowsy   Head: Normocephalic, without obvious abnormality, atraumatic. Lungs:B/la ir entry Heart: s1s2 Abdomen: Soft,  Extremities:left heel pressure ulcer Skin: No rashes or lesions. Or bruising Lymph: Cervical, supraclavicular normal. Neurologic: cannot be  assessed Lab Results Recent Labs    02/17/19 0533 02/18/19 0215  WBC  --  10.6*  HGB  --  9.4*  HCT  --  28.4*  NA 139 137  K 4.0 4.0  CL 102 100  CO2 25 23  BUN 14 18  CREATININE 3.27* 3.95*   Liver Panel Recent Labs    02/17/19 0533  ALBUMIN 2.0*   Sedimentation Rate No results for input(s): ESRSEDRATE in the last 72 hours. C-Reactive Protein No results for input(s): CRP in the last 72 hours.  Microbiology: 12/7, 12/9, 12/11, 12/13,  MRSA 12/15 WC MRSA 12/16 [facility] Studies/Results: DG Foot 2 Views Left  Result Date: 02/18/2019 CLINICAL DATA:  Left heel wound.  Rule out osteomyelitis. EXAM: LEFT FOOT - 2 VIEW COMPARISON:  None. FINDINGS: Plantar calcaneal spur. No bone destruction or lucency to suggest osteomyelitis. No acute bony abnormality. Specifically, no fracture, subluxation, or dislocation. Vascular calcifications. IMPRESSION: Calcaneal spur.  No evidence of osteomyelitis. Electronically Signed   By: Rolm Baptise M.D.   On: 02/18/2019 18:18     Assessment/Plan:  MRSA bacteremia was persistent for a week- TEE pending Spine imaging negative Left heel wound present since admission- a pressure sore  MRSA + But Xray no osteo and also Seen by podiatrist who does not think this is deep AV fistula which has a  stent and recent venoplasty for stricture could be a source but US showed hematoma only  On ceftaroline + dapto Repeat culture from yesterday Pending  Encephalopathy  ESRD  Age indeterminate DVT left brachial vein  Hypothyroidism Not been eating   Discussed the management with her husband

## 2019-02-20 ENCOUNTER — Telehealth: Payer: Self-pay | Admitting: Family Medicine

## 2019-02-20 ENCOUNTER — Inpatient Hospital Stay: Payer: Self-pay

## 2019-02-20 LAB — GLUCOSE, CAPILLARY
Glucose-Capillary: 142 mg/dL — ABNORMAL HIGH (ref 70–99)
Glucose-Capillary: 125 mg/dL — ABNORMAL HIGH (ref 70–99)
Glucose-Capillary: 88 mg/dL (ref 70–99)
Glucose-Capillary: 113 mg/dL — ABNORMAL HIGH (ref 70–99)

## 2019-02-20 MED ORDER — EPOETIN ALFA 10000 UNIT/ML IJ SOLN: 4000.0000 [IU] | INTRAMUSCULAR | Status: DC

## 2019-02-20 MED ORDER — EPOETIN ALFA 10000 UNIT/ML IJ SOLN
4000.0000 [IU] | INTRAMUSCULAR | Status: DC
  Administered 2019-02-23 – 2019-03-16 (×8): 4000 [IU] via INTRAVENOUS
  Filled 2019-02-20 (×3): qty 1

## 2019-02-20 MED ORDER — ENOXAPARIN SODIUM 80 MG/0.8ML ~~LOC~~ SOLN
1.0000 mg/kg | SUBCUTANEOUS | Status: DC
  Administered 2019-02-20: 75 mg via SUBCUTANEOUS
  Filled 2019-02-20 (×2): qty 0.8

## 2019-02-20 NOTE — Telephone Encounter (Signed)
I called pt twice and left vm to call ofc. °

## 2019-02-20 NOTE — Progress Notes (Signed)
Pharmacy Electrolyte Monitoring Consult:  Pharmacy consulted to assist in monitoring and replacing electrolytes in this 62 y.o. female admitted on 02/09/2019. Patient with ESRD; last dialysis 12/14. Patient with MRSA bacteremia awaiting TEE. She has a h/o CAD and is s/p CABG  Labs:  Sodium (mmol/L)  Date Value  02/19/2019 142  03/30/2014 140   Potassium (mmol/L)  Date Value  02/19/2019 3.0 (L)  03/30/2014 4.3   Magnesium (mg/dL)  Date Value  02/19/2019 1.9  07/19/2013 1.7 (L)   Phosphorus (mg/dL)  Date Value  02/19/2019 2.9   Calcium (mg/dL)  Date Value  02/19/2019 8.4 (L)   Calcium, Total (mg/dL)  Date Value  03/30/2014 9.0   Albumin (g/dL)  Date Value  02/19/2019 2.2 (L)  02/11/2018 4.2  07/23/2013 2.7 (L)   Corrected Ca: 9.8 mg/dL  Goals of Therapy Given Cardiac History: Potassium 4.0 - 5.1 mmol/L Magnesium 2.0 - 2.4 mg/dL All Other Electrolytes WNL   Plan:  Magnesium is borderline: no replacement for now given limited follow-up options on labs (see below)  Potassium was replaced overnight with 40 mEq IVKCl  The patient is a very difficult, and often impossible stick due to severe edema so lab follow-ups are limited  BMP, magnesium level in am if possible  Pharmacy will continue to monitor and adjust per consult.   Dallie Piles 02/20/2019 8:07 AM

## 2019-02-20 NOTE — Progress Notes (Signed)
Pre HD Tx   02/20/19 1155  Hand-Off documentation  Report given to (Full Name) Beatris Ship, RN   Report received from (Full Name) Dub Mikes, RN   Vital Signs  Temp 98.6 F (37 C)  Temp Source Oral  Pulse Rate 72  Pulse Rate Source Monitor  Resp 14  BP 121/62  BP Location Right Leg  BP Method Automatic  Patient Position (if appropriate) Lying  Oxygen Therapy  SpO2 97 %  O2 Device Room Air  Pulse Oximetry Type Continuous  Pain Assessment  Pain Scale 0-10  Pain Score 0  Dialysis Weight  Weight 74.9 kg  Type of Weight Pre-Dialysis  Time-Out for Hemodialysis  What Procedure? HD   Pt Identifiers(min of two) First/Last Name;MRN/Account#  Correct Site? Yes  Correct Side? Yes  Correct Procedure? Yes  Consents Verified? Yes  Rad Studies Available? N/A  Safety Precautions Reviewed? Yes  Engineer, civil (consulting) Number 6  Station Number 4  UF/Alarm Test Passed  Conductivity: Meter 14  Conductivity: Machine  14  pH 7.2  Reverse Osmosis Main  Normal Saline Lot Number [id number]  Dialyzer Lot Number [id number]  Disposable Set Lot Number [id number]  Machine Temperature 98.6 F (37 C)  Musician and Audible Yes  Blood Lines Intact and Secured Yes  Pre Treatment Patient Checks  Vascular access used during treatment Fistula  Hepatitis B Surface Antigen Results Negative  Date Hepatitis B Surface Antigen Drawn 02/10/19  Isolation Initiated Yes  Hepatitis B Surface Antibody  (<10)  Hemodialysis Consent Verified Yes  Hemodialysis Standing Orders Initiated Yes  ECG (Telemetry) Monitor On Yes  Prime Ordered Normal Saline  Length of  DialysisTreatment -hour(s) 3.5 Hour(s)  Dialysis Treatment Comments Na 140  Dialyzer Elisio 17H NR  Dialysate 2K;2.5 Ca  Dialysis Anticoagulant None  Dialysate Flow Ordered 600  Blood Flow Rate Ordered 400 mL/min  Ultrafiltration Goal 1.5 Liters  Pre Treatment Labs Phosphorus  Dialysis Blood Pressure Support Ordered Normal Saline  Education  / Care Plan  Dialysis Education Provided Yes  Documented Education in Care Plan Yes  Fistula / Graft Right Upper arm Arteriovenous vein graft  Placement Date/Time: 02/03/16 1210   Placed prior to admission: No  Orientation: Right  Access Location: Upper arm  Access Type: Arteriovenous vein graft  Site Condition No complications;Bleeding  Fistula / Graft Assessment Present;Thrill;Bruit  Status Patent  Drainage Description Sanguineous

## 2019-02-20 NOTE — Progress Notes (Signed)
Jamie Arias, [place] 02/20/19  Subjective:  Patient seen and evaluated during hemodialysis. Appears to be tolerating treatment well. Appears to be a bit confused during treatment however.  Objective:  Vital signs in last 24 hours:  Temp:  [97.7 F (36.5 C)-98.6 F (37 C)] 98.5 F (36.9 C) (12/18 0424) Pulse Rate:  [68-80] 69 (12/18 1440) Resp:  [9-20] 11 (12/18 1440) BP: (92-156)/(40-116) 105/52 (12/18 1440) SpO2:  [93 %-100 %] 100 % (12/18 1440)  Weight change:  Filed Weights   02/12/19 1445 02/17/19 2302 02/18/19 1050  Weight: 78.2 kg 74.1 kg 74.9 kg    Intake/Output:    Intake/Output Summary (Last 24 hours) at 02/20/2019 1458 Last data filed at 02/20/2019 0309 Gross per 24 hour  Intake 899.51 ml  Output -  Net 899.51 ml     Physical Exam: General:  No acute distress, chronically ill-appearing  HEENT  [place]/AT, hearing intact  Pulm/lungs  clear bilateral, normal effort  CVS/Heart  S1S2 no rubs  Abdomen:   Soft, nontender  Extremities:  Trace bilateral lower extremity edema  Neurologic:  Awake, confused  Skin:  Warm  Access:  AV fistula, right arm       Basic Metabolic Panel:  Recent Labs  Lab 02/14/19 0340 02/15/19 0545 02/16/19 0433 02/17/19 0533 02/18/19 0215 02/18/19 1200 02/19/19 2119  NA 142 138 139 139 137  --  142  K 3.1* 3.6 3.4* 4.0 4.0  --  3.0*  CL 104 102 102 102 100  --  100  CO2 26 24 24 25 23   --  28  GLUCOSE 109* 96 166* 101* 128*  --  119*  BUN 23 26* 30* 14 18  --  11  CREATININE 4.03* 4.64* 5.30* 3.27* 3.95*  --  3.29*  CALCIUM 8.0* 7.9* 8.2* 8.1* 8.0*  --  8.4*  MG 2.2 2.2 1.9  --  1.7  --  1.9  PHOS 2.2* 2.2* 2.5 1.8* 2.7 3.0 2.9     CBC: Recent Labs  Lab 02/14/19 0340 02/15/19 0545 02/15/19 1824 02/16/19 0433 02/18/19 0215 02/19/19 2119  WBC 8.0 8.9  --  8.9 10.6* 15.9*  HGB 7.2* 6.8* 8.8* 8.7* 9.4* 8.7*  HCT 22.2* 20.0* 25.0* 24.7* 28.4* 25.6*  MCV 83.8 80.3  --  79.7* 84.3 83.1   PLT 106* 105*  --  126* 177 218      Lab Results  Component Value Date   HEPBSAG NON REACTIVE 02/10/2019      Microbiology:  Recent Results (from the past 240 hour(s))  MRSA PCR Screening     Status: Abnormal   Collection Time: 02/10/19  5:03 PM   Specimen: Nasopharyngeal  Result Value Ref Range Status   MRSA by PCR POSITIVE (A) NEGATIVE Final    Comment:        The GeneXpert MRSA Assay (FDA approved for NASAL specimens only), is one component of a comprehensive MRSA colonization surveillance program. It is not intended to diagnose MRSA infection nor to guide or monitor treatment for MRSA infections. RESULT CALLED TO, READ BACK BY AND VERIFIED WITH: Britt Boozer @1839  02/10/19 MJU Performed at [facility], [address] [place], [place] [address]   Culture, blood (routine x 2)     Status: Abnormal   Collection Time: 02/11/19 12:31 PM   Specimen: BLOOD  Result Value Ref Range Status   Specimen Description   Final    BLOOD PORTA CATH Performed at [facility],  [address] [place], [place] [address]    Special Requests   Final    BOTTLES DRAWN AEROBIC AND ANAEROBIC Blood Culture adequate volume Performed at [facility], [address] [place], [place] [address]    Culture  Setup Time   Final    GRAM POSITIVE COCCI ANAEROBIC BOTTLE ONLY CRITICAL RESULT CALLED TO, READ BACK BY AND VERIFIED WITH: SCOTT HALL AT [id number] 02/12/2019 [facility]  Performed at [facility], [address] [place], [place] [address]    Culture (A)  Final    STAPHYLOCOCCUS AUREUS SUSCEPTIBILITIES PERFORMED ON PREVIOUS CULTURE WITHIN THE LAST 5 DAYS. Performed at [facility], [place] [address], [place], [place] [address]    Report Status 02/13/2019 FINAL  Final  Culture, blood (routine x 2)     Status: None   Collection Time: 02/11/19  3:57 PM   Specimen: BLOOD  Result Value Ref Range Status   Specimen Description BLOOD PORTA CATH  Final    Special Requests   Final    BOTTLES DRAWN AEROBIC AND ANAEROBIC Blood Culture adequate volume   Culture   Final    NO GROWTH 5 DAYS Performed at [facility], [address] [place], [place] [address]    Report Status 02/16/2019 FINAL  Final  CULTURE, BLOOD (ROUTINE X 2) w Reflex to ID Panel     Status: Abnormal   Collection Time: 02/13/19  2:18 PM   Specimen: BLOOD LEFT HAND  Result Value Ref Range Status   Specimen Description BLOOD LEFT HAND  Final   Special Requests   Final    BOTTLES DRAWN AEROBIC AND ANAEROBIC Blood Culture results may not be optimal due to an inadequate volume of blood received in culture bottles   Culture  Setup Time   Final    AEROBIC BOTTLE ONLY GRAM POSITIVE COCCI IN CLUSTERS CRITICAL RESULT CALLED TO, READ BACK BY AND VERIFIED WITH: Herbert Pun AT [id number] ON 02/14/2019 [place]. Performed at [facility], [place] [address], [place], [place] [address]    Culture METHICILLIN RESISTANT STAPHYLOCOCCUS AUREUS (A)  Final   Report Status 02/16/2019 FINAL  Final   Organism ID, Bacteria METHICILLIN RESISTANT STAPHYLOCOCCUS AUREUS  Final      Susceptibility   Methicillin resistant staphylococcus aureus - MIC*    CIPROFLOXACIN >=8 RESISTANT Resistant     ERYTHROMYCIN >=8 RESISTANT Resistant     GENTAMICIN <=0.5 SENSITIVE Sensitive     OXACILLIN >=4 RESISTANT Resistant     TETRACYCLINE <=1 SENSITIVE Sensitive     VANCOMYCIN <=0.5 SENSITIVE Sensitive     TRIMETH/SULFA <=10 SENSITIVE Sensitive     CLINDAMYCIN RESISTANT Resistant     RIFAMPIN <=0.5 SENSITIVE Sensitive     Inducible Clindamycin POSITIVE Resistant     * METHICILLIN RESISTANT STAPHYLOCOCCUS AUREUS  Blood Culture ID Panel (Reflexed)     Status: Abnormal   Collection Time: 02/13/19  2:18 PM  Result Value Ref Range Status   Enterococcus species NOT DETECTED NOT DETECTED Final   Listeria monocytogenes NOT DETECTED NOT DETECTED Final   Staphylococcus species DETECTED (A) NOT DETECTED Final     Comment: CRITICAL RESULT CALLED TO, READ BACK BY AND VERIFIED WITH: ABBEY ELLINGTON AT [id number] ON 02/14/2019 [place].    Staphylococcus aureus (BCID) DETECTED (A) NOT DETECTED Final    Comment: Methicillin (oxacillin)-resistant Staphylococcus aureus (MRSA). MRSA is predictably resistant to beta-lactam antibiotics (except ceftaroline). Preferred therapy is vancomycin unless clinically contraindicated. Patient requires contact precautions if  hospitalized. CRITICAL RESULT CALLED TO, READ BACK BY AND VERIFIED WITH: Herbert Pun AT [id number] ON 02/14/2019 [place].    Methicillin resistance DETECTED (A) NOT DETECTED Final    Comment: CRITICAL RESULT CALLED TO, READ BACK BY AND VERIFIED WITH: Herbert Pun AT [id number] ON 02/14/2019 [place].    Streptococcus species NOT DETECTED NOT DETECTED Final   Streptococcus agalactiae NOT DETECTED NOT DETECTED Final   Streptococcus pneumoniae NOT DETECTED NOT DETECTED Final   Streptococcus pyogenes NOT DETECTED NOT DETECTED Final   Acinetobacter baumannii NOT DETECTED NOT DETECTED Final   Enterobacteriaceae species NOT DETECTED NOT DETECTED Final   Enterobacter cloacae complex NOT DETECTED NOT DETECTED Final   Escherichia coli NOT DETECTED NOT DETECTED Final   Klebsiella oxytoca NOT DETECTED NOT DETECTED Final   Klebsiella pneumoniae NOT DETECTED NOT DETECTED Final   Proteus species NOT DETECTED NOT DETECTED Final   Serratia marcescens NOT DETECTED NOT DETECTED Final   Haemophilus influenzae NOT DETECTED NOT DETECTED Final   Neisseria meningitidis NOT DETECTED NOT DETECTED Final   Pseudomonas aeruginosa NOT DETECTED NOT DETECTED Final   Candida albicans NOT DETECTED NOT DETECTED Final   Candida glabrata NOT DETECTED NOT DETECTED Final   Candida krusei NOT DETECTED NOT DETECTED Final   Candida parapsilosis NOT DETECTED NOT DETECTED Final   Candida tropicalis NOT DETECTED NOT DETECTED Final    Comment: Performed at [facility], [address]  [place], [place] [address]  CULTURE, BLOOD (ROUTINE X 2) w Reflex to ID Panel     Status: None   Collection Time: 02/13/19  3:09 PM   Specimen: BLOOD  Result Value Ref Range Status   Specimen Description BLOOD BLOOD LEFT HAND  Final   Special Requests   Final    BOTTLES DRAWN AEROBIC AND ANAEROBIC Blood Culture adequate volume   Culture   Final    NO GROWTH 5 DAYS Performed at [facility], [address] [place], [place] [address]    Report Status 02/18/2019 FINAL  Final  Culture, blood (routine x 2)     Status: Abnormal   Collection Time: 02/15/19 12:49 PM   Specimen: BLOOD  Result Value Ref Range Status   Specimen Description   Final    BLOOD LT HAND Performed at [facility], [address] [place], [place] [address]    Special Requests   Final    BOTTLES DRAWN AEROBIC AND ANAEROBIC Blood Culture results may not be optimal due to an inadequate volume of blood received in culture bottles Performed at [facility], [address] [place], [place] [address]    Culture  Setup Time   Final    GRAM POSITIVE COCCI IN BOTH AEROBIC AND ANAEROBIC BOTTLES CRITICAL RESULT CALLED TO, READ BACK BY AND VERIFIED WITHEleonore Chiquito AT [id number] 02/16/2019 [place] Performed at [facility], [address] [place], [place] [address]    Culture (A)  Final    METHICILLIN RESISTANT STAPHYLOCOCCUS AUREUS STAPHYLOCOCCUS SPECIES (COAGULASE NEGATIVE) THE SIGNIFICANCE OF ISOLATING THIS ORGANISM FROM A SINGLE SET OF BLOOD CULTURES WHEN MULTIPLE SETS ARE DRAWN IS UNCERTAIN. PLEASE NOTIFY THE MICROBIOLOGY DEPARTMENT WITHIN ONE WEEK IF SPECIATION AND SENSITIVITIES ARE REQUIRED. Performed at [facility], [place] [address], [place],  [address]    Report Status 02/18/2019 FINAL  Final   Organism ID, Bacteria METHICILLIN RESISTANT STAPHYLOCOCCUS AUREUS  Final      Susceptibility   Methicillin resistant staphylococcus aureus - MIC*    CIPROFLOXACIN >=8  RESISTANT  Resistant     ERYTHROMYCIN >=8 RESISTANT Resistant     GENTAMICIN <=0.5 SENSITIVE Sensitive     OXACILLIN >=4 RESISTANT Resistant     TETRACYCLINE <=1 SENSITIVE Sensitive     VANCOMYCIN <=0.5 SENSITIVE Sensitive     TRIMETH/SULFA <=10 SENSITIVE Sensitive     CLINDAMYCIN RESISTANT Resistant     RIFAMPIN <=0.5 SENSITIVE Sensitive     Inducible Clindamycin POSITIVE Resistant     * METHICILLIN RESISTANT STAPHYLOCOCCUS AUREUS  Aerobic Culture (superficial specimen)     Status: None   Collection Time: 02/17/19  1:31 PM   Specimen: Heel; Wound  Result Value Ref Range Status   Specimen Description   Final    HEEL Performed at [facility], [address] [place], [place] [address]    Special Requests   Final    NONE Performed at [facility], [address] [place], [place] [address]    Gram Stain   Final    NO WBC SEEN MODERATE GRAM POSITIVE COCCI IN PAIRS    Culture   Final    ABUNDANT METHICILLIN RESISTANT STAPHYLOCOCCUS AUREUS ABUNDANT DIPHTHEROIDS(CORYNEBACTERIUM SPECIES) Standardized susceptibility testing for this organism is not available. Performed at [facility], [place] [address] [place], [place] [address]    Report Status 02/19/2019 FINAL  Final   Organism ID, Bacteria METHICILLIN RESISTANT STAPHYLOCOCCUS AUREUS  Final      Susceptibility   Methicillin resistant staphylococcus aureus - MIC*    CIPROFLOXACIN >=8 RESISTANT Resistant     ERYTHROMYCIN >=8 RESISTANT Resistant     GENTAMICIN <=0.5 SENSITIVE Sensitive     OXACILLIN >=4 RESISTANT Resistant     TETRACYCLINE <=1 SENSITIVE Sensitive     VANCOMYCIN 1 SENSITIVE Sensitive     TRIMETH/SULFA <=10 SENSITIVE Sensitive     CLINDAMYCIN RESISTANT Resistant     RIFAMPIN <=0.5 SENSITIVE Sensitive     Inducible Clindamycin POSITIVE Resistant     * ABUNDANT METHICILLIN RESISTANT STAPHYLOCOCCUS AUREUS  CULTURE, BLOOD (ROUTINE X 2) w Reflex to ID Panel     Status: None (Preliminary result)    Collection Time: 02/18/19 11:29 PM   Specimen: BLOOD  Result Value Ref Range Status   Specimen Description BLOOD LEFT ARM  Final   Special Requests   Final    BOTTLES DRAWN AEROBIC AND ANAEROBIC Blood Culture adequate volume   Culture   Final    NO GROWTH 2 DAYS Performed at [facility], [address] [place], [place] [address]    Report Status PENDING  Incomplete  CULTURE, BLOOD (ROUTINE X 2) w Reflex to ID Panel     Status: None (Preliminary result)   Collection Time: 02/18/19 11:29 PM   Specimen: BLOOD  Result Value Ref Range Status   Specimen Description BLOOD LEFT ARM  Final   Special Requests   Final    BOTTLES DRAWN AEROBIC AND ANAEROBIC Blood Culture adequate volume   Culture   Final    NO GROWTH 2 DAYS Performed at [facility], [address] [place], [place] [address]    Report Status PENDING  Incomplete    Coagulation Studies: Recent Labs    02/17/19 06/11/09  LABPROT 15.6*  INR 1.3*    Urinalysis: No results for input(s): COLORURINE, LABSPEC, PHURINE, GLUCOSEU, HGBUR, BILIRUBINUR, KETONESUR, PROTEINUR, UROBILINOGEN, NITRITE, LEUKOCYTESUR in the last 72 hours.  Invalid input(s): APPERANCEUR    Imaging: DG Foot 2 Views Left  Result Date: 02/18/2019 CLINICAL DATA:  Left  heel wound.  Rule out osteomyelitis. EXAM: LEFT FOOT - 2 VIEW COMPARISON:  None. FINDINGS: Plantar calcaneal spur. No bone destruction or lucency to suggest osteomyelitis. No acute bony abnormality. Specifically, no fracture, subluxation, or dislocation. Vascular calcifications. IMPRESSION: Calcaneal spur.  No evidence of osteomyelitis. Electronically Signed   By: Rolm Baptise M.D.   On: 02/18/2019 18:18   [place] EKG SITE RITE  Result Date: 02/20/2019 If Site Rite image not attached, placement could not be confirmed due to current cardiac rhythm.    Medications:   . sodium chloride Stopped (02/20/19 0101)  . ceFTAROline (TEFLARO) IV Stopped (02/20/19 0037)  . DAPTOmycin  (CUBICIN)  IV Stopped (02/18/19 2358)   . [START ON 02/24/2019] apixaban  2.5 mg Oral BID   Followed by  . apixaban  10 mg Oral BID  . aspirin  81 mg Per Tube Daily  . chlorhexidine  15 mL Mouth Rinse BID  . Chlorhexidine Gluconate Cloth  6 each Topical Q0600  . [START ON 02/23/2019] epoetin (EPOGEN/PROCRIT) injection  4,000 Units Intravenous Q M,W,F-HD  . feeding supplement (NEPRO CARB STEADY)  237 mL Oral TID BM  . FLUoxetine  20 mg Per Tube Daily  . fluticasone  2 spray Each Nare Daily  . insulin aspart  0-9 Units Subcutaneous TID WC  . levothyroxine  88 mcg Per Tube QAC breakfast  . mouth rinse  15 mL Mouth Rinse q12n4p  . midodrine  10 mg Per Tube TID WC  . multivitamin  1 tablet Oral QHS  . pantoprazole (PROTONIX) IV  40 mg Intravenous Q24H  . venlafaxine  75 mg Per Tube BID WC  . zinc sulfate  220 mg Per Tube Daily   sodium chloride, haloperidol lactate, ondansetron (ZOFRAN) IV  Assessment/ Plan:  63 y.o.caucasian female with end-stage renal disease, atrial fibrillation, chronic diastolic congestive heart failure, CAD status post PCI, CABG,  type 2 diabetes, hypertension, hyperlipidemia, hypothyroidism, Covid positive in July 2020, recent nursing home stay in [place]  NEPHROLOGIST: Jamie Dana MD  LOCATION: [place]: M-W-F 2nd Shift  EDW: 81.5 kg.  KIDNEY: [place] Optiflux 180NR  LITERS PROC: liters/treatment  HD TIME: 225  ACCESS: R Upper Arm AVF  NEEDLE SIZE:  ANTICOAG: Custom Heparin 2000  BATH: 2K/2.5Ca  QB: 400 ml/min  QD: 600 ml/min   1.  End-stage renal disease -Patient seen and evaluated during dialysis treatment.  Appears to be tolerating treatment well.  However does appear to be having increasing confusion during the treatment.  2.  Anemia of chronic kidney disease anemia of chronic kidney disease Lab Results  Component Value Date   HGB 8.7 (L) 02/19/2019  -Hemoglobin currently 8.7.  We will administer Epogen 4000 units IV with  dialysis today.  3. SHPTH -phosphorus currently 2.9.  Continue to monitor.  4. Altered mental status -A bit more confused during dialysis treatment today.  Speech a bit unintelligible at times.  5. Sepsis - blood culture positive for MRSA from 02/09/2019  ? Source; ID team following Patient started on ceftaroline and on Cubicin. Ultrasound of right upper extremity appears more consistent with hematoma rather than infection however infection not totally excluded.  CT of the C/T/L-spine negative for osteomyelitis. TEE being considered however patient cannot consent.  6. Hypokalemia Switch pt to 4k bath.        LOS: 11 Jamie Arias 12/18/20202:58 PM  [place], [place]  Note: This note was prepared with Dragon dictation. Any  transcription errors are unintentional

## 2019-02-20 NOTE — Progress Notes (Signed)
Notified Dr. Priscella Mann that the patients son, Lennette Bihari wanted to speak with him about the patient. Dr. Priscella Mann said he would call

## 2019-02-20 NOTE — Progress Notes (Signed)
Post HD Tx    02/20/19 1545  Hand-Off documentation  Report given to (Full Name) Dub Mikes, RN   Report received from (Full Name) Beatris Ship, RN   Vital Signs  Temp 98.6 F (37 C)  Temp Source Oral  Pulse Rate 72  Pulse Rate Source Monitor  Resp 19  BP (!) 112/45  BP Location Right Arm  BP Method Automatic  Patient Position (if appropriate) Lying  Oxygen Therapy  SpO2 100 %  O2 Device Room Air  Pulse Oximetry Type Continuous  Pain Assessment  Pain Scale 0-10  Pain Score 0  Dialysis Weight  Weight 73.4 kg  Type of Weight Post-Dialysis  Post-Hemodialysis Assessment  Rinseback Volume (mL) 250 mL  KECN 69.3 V  Dialyzer Clearance Lightly streaked  Duration of HD Treatment -hour(s) 3.5 hour(s)  Hemodialysis Intake (mL) 500 mL  UF Total -Machine (mL) 2000 mL  Net UF (mL) 1500 mL  Tolerated HD Treatment Yes  AVG/AVF Arterial Site Held (minutes) 10 minutes  AVG/AVF Venous Site Held (minutes) 5 minutes  Fistula / Graft Right Upper arm Arteriovenous vein graft  Placement Date/Time: 02/03/16 1210   Placed prior to admission: No  Orientation: Right  Access Location: Upper arm  Access Type: Arteriovenous vein graft  Site Condition No complications;Bleeding  Fistula / Graft Assessment Present;[place]rill;Bruit  Status Deaccessed  Drainage Description None

## 2019-02-20 NOTE — Progress Notes (Signed)
PROGRESS NOTE    Jamie Arias  [id number] DOB: December 31, 1955 DOA: 02/09/2019 PCP: [name], MD    Brief Narrative:  63 year old female with end-stage renal disease on hemodialysis, admitted to the hospital with severe septic shock second encephalopathic and is unable to consent to the procedure ary to MRSA bacteremia. She had associated metabolic encephalopathy. She is being treated with intravenous vancomycin. She is not requiring any further pressors. Encephalopathy has been slow to improve.  Bacteremia has been persistent without any clear etiology.  CT spine unrevealing for any obvious infectious focus.  Infectious disease following.  Cardiology following.  Plan for inpatient transesophageal echocardiogram currently delayed as patient persistently encephalopathic and is unable to consent for the procedure  12/17: Remains encephalopathic.  Does not provide any reliable history.  Perseverates on the name [name].  12/18: Encephalopathy appears to be worsening.  Patient not eating.  Opens eyes but does not provide any meaningful history.  Off the list for TEE currently given encephalopathy.  Seen by neurology yesterday, discussed with Dr. [name], no acute neurologic findings and encephalopathy most likely related to underlying sepsis  Assessment & Plan:   Principal Problem:   Sepsis due to methicillin resistant Staphylococcus aureus (MRSA) ([place]) Active Problems:   Diabetes mellitus, type 2 (HCC)   Hypotension   Chronic diastolic CHF (congestive heart failure) (HCC)   ESRD (end stage renal disease) (HCC)   Altered level of consciousness   Pressure injury of skin   MRSA bacteremia   Arm DVT (deep venous thromboembolism), acute, left ([place])   Acute encephalopathy   1. Septic shock secondary to MRSA bacteremia.  Shock resolved Source control not achieved Patient remains persistently bacteremic Source of bacteremia is unclear TTE unrevealing CT spine unrevealing for  obvious infectious focus TEE planned however patient has been too encephalopathic and cannot consent to the procedure and presents a high risk for anesthesia Staph aureus isolated from heel wound Seen by podiatry, heel wound unlikely source of bacteremia Repeat cultures on 02/18/2019 PENDING Plan: Continue current antibiotic treatment regimen of daptomycin and ceftaroline Follow blood cultures from 02/18/2019 Continue to monitor mental status and neurologic recovery TEE on hold ID following   2. End-stage renal disease on hemodialysis.  Nephrology following for dialysis needs.   3. Acute encephalopathy.  Possible sepsis related MRI of the brain did not show any acute infarct.  EEG did not show any evidence of seizures.  She was noted to have twitching in her lower jaw, but her husband reports this is been present for the past several months.   Ammonia level is normal.   ABG does not show significant CO2 retention. -Continue antibiotic regimen for treatment of MRSA bacteremia -Continue to monitor mental status closely, frequent reorientation measures -Neurology consult: 02/19/2019.  Discussed with Dr. [name].  Recommendations appreciated.  Per neurology lumbar puncture is unlikely to add diagnostic value.  Remainder of neurologic work-up essentially unrevealing.  Persistent encephalopathy likely related to underlying bacteremia and sepsis  -Consider repeat head CT to work up possible [place] in setting of anticoagulation  4. Acute left arm DVT.  Noted on venous Dopplers.  Started on heparin infusion, but this was held due to oozing around central line.   He had a central line has been removed Continue heparin Plan to transition to Eliquis on discharge  5. History of atrial fibrillation.  She was taking amiodarone and Eliquis prior to admission.  Currently in sinus rhythm.  Continue to follow heart rate.  On  heparin for left upper extremity DVT  6. Chronic diastolic congestive  heart failure.  Appears to have significant edema.  Can hopefully remove volume with dialysis.  7. Diabetes.  Continue on sliding scale insulin.  Blood sugars are currently stable.  8. Acute on chronic anemia.  Related to blood loss from oozing around IV sites.  Improved after 1 unit prbc on 12/13  9. Goals of care.   With patient's multiple medical issues, poor functional nutritional status, her prognosis is poor.   Request palliative care to address goals of care Per notes plan to meet with husband and patient at bedside   DVT prophylaxis: Heparin GTT Code Status: Full code Family Communication: spoke with husband [name] at bedside 12/17 Disposition Plan: May need placement pending hospital course   Consultants:   ID  Nephrology  Cardiology  Neurology  Procedures:   Right femoral CVC 12/8> removed  Right femoral art line 12/8> removed  Antimicrobials:   Vancomycin 12/7>12/14  Daptomycin 12/14>  ceftaroline 12/14>   Subjective: Patient seen and examined No family members at bedside today Patient somnolent, does open her eyes.  Does not provide any reliable history   Objective: Vitals:   02/20/19 1315 02/20/19 1330 02/20/19 1345 02/20/19 1400  BP:  (!) 102/40 98/68 92/61   Pulse: 71 71 73 80  Resp: 10 10 (!) 9 14  Temp:      TempSrc:      SpO2: 96% 99% 100% 100%  Weight:      Height:        Intake/Output Summary (Last 24 hours) at 02/20/2019 1411 Last data filed at 02/20/2019 0309 Gross per 24 hour  Intake 899.51 ml  Output --  Net 899.51 ml   Filed Weights   02/12/19 1445 02/17/19 2302 02/18/19 1050  Weight: 78.2 kg 74.1 kg 74.9 kg    Examination:  General exam: somnolent, but wakes up to voice Respiratory system: diminished breath sounds bilaterally. Respiratory effort normal. Cardiovascular system:RRR. No murmurs, rubs, gallops. Gastrointestinal system: Abdomen is nondistended, soft and nontender. No organomegaly or masses felt.  Normal bowel sounds heard. Central nervous system: No focal neurological deficits. Extremities: 1-2+ edema bilaterally Skin: No rashes, lesions or ulcers Psychiatry: somnolent.    Data Reviewed: I have personally reviewed following labs and imaging studies  CBC: Recent Labs  Lab 02/14/19 0340 02/15/19 0545 02/15/19 1824 02/16/19 0433 02/18/19 0215 02/19/19 2119  WBC 8.0 8.9  --  8.9 10.6* 15.9*  HGB 7.2* 6.8* 8.8* 8.7* 9.4* 8.7*  HCT 22.2* 20.0* 25.0* 24.7* 28.4* 25.6*  MCV 83.8 80.3  --  79.7* 84.3 83.1  PLT 106* 105*  --  126* 177 [id number]   Basic Metabolic Panel: Recent Labs  Lab 02/14/19 0340 02/15/19 0545 02/16/19 0433 02/17/19 0533 02/18/19 0215 02/18/19 1200 02/19/19 2119  NA 142 138 139 139 137  --  142  K 3.1* 3.6 3.4* 4.0 4.0  --  3.0*  CL 104 102 102 102 100  --  100  CO2 26 24 24 25 23   --  28  GLUCOSE 109* 96 166* 101* 128*  --  119*  BUN 23 26* 30* 14 18  --  11  CREATININE 4.03* 4.64* 5.30* 3.27* 3.95*  --  3.29*  CALCIUM 8.0* 7.9* 8.2* 8.1* 8.0*  --  8.4*  MG 2.2 2.2 1.9  --  1.7  --  1.9  PHOS 2.2* 2.2* 2.5 1.8* 2.7 3.0 2.9   GFR: Estimated Creatinine Clearance: 16.9 mL/min (  A) (by C-G formula based on SCr of 3.29 mg/dL (H)). Liver Function Tests: Recent Labs  Lab 02/14/19 0340 02/16/19 0433 02/17/19 0533 02/19/19 2119  ALBUMIN 2.2* 2.1* 2.0* 2.2*   No results for input(s): LIPASE, AMYLASE in the last 168 hours. Recent Labs  Lab 02/17/19 0533 02/18/19 1334 02/19/19 2119  AMMONIA 16 10 12    Coagulation Profile: Recent Labs  Lab 02/17/19 2011  INR 1.3*   Cardiac Enzymes: Recent Labs  Lab 02/16/19 0433  CKTOTAL 17*   BNP (last 3 results) No results for input(s): PROBNP in the last 8760 hours. HbA1C: No results for input(s): HGBA1C in the last 72 hours. CBG: Recent Labs  Lab 02/19/19 1135 02/19/19 1703 02/19/19 2039 02/20/19 0742 02/20/19 1137  GLUCAP 152* 147* 111* 142* 125*   Lipid Profile: No results for input(s):  CHOL, HDL, LDLCALC, TRIG, CHOLHDL, LDLDIRECT in the last 72 hours. Thyroid Function Tests: No results for input(s): TSH, T4TOTAL, FREET4, T3FREE, THYROIDAB in the last 72 hours. Anemia Panel: No results for input(s): VITAMINB12, FOLATE, FERRITIN, TIBC, IRON, RETICCTPCT in the last 72 hours. Sepsis Labs: No results for input(s): PROCALCITON, LATICACIDVEN in the last 168 hours.  Recent Results (from the past 240 hour(s))  MRSA PCR Screening     Status: Abnormal   Collection Time: 02/10/19  5:03 PM   Specimen: Nasopharyngeal  Result Value Ref Range Status   MRSA by PCR POSITIVE (A) NEGATIVE Final    Comment:        The GeneXpert MRSA Assay (FDA approved for NASAL specimens only), is one component of a comprehensive MRSA colonization surveillance program. It is not intended to diagnose MRSA infection nor to guide or monitor treatment for MRSA infections. RESULT CALLED TO, READ BACK BY AND VERIFIED WITH: Britt Boozer @1839  02/10/19 MJU Performed at [facility], [address] [place], [place] [address]   Culture, blood (routine x 2)     Status: Abnormal   Collection Time: 02/11/19 12:31 PM   Specimen: BLOOD  Result Value Ref Range Status   Specimen Description   Final    BLOOD PORTA CATH Performed at [facility] [address], [place], [place] [address]    Special Requests   Final    BOTTLES DRAWN AEROBIC AND ANAEROBIC Blood Culture adequate volume Performed at [facility], [address] [place], [place] [address]    Culture  Setup Time   Final    GRAM POSITIVE COCCI ANAEROBIC BOTTLE ONLY CRITICAL RESULT CALLED TO, READ BACK BY AND VERIFIED WITH: SCOTT HALL AT [id number] 02/12/2019 [facility]  Performed at [facility], [address] [place], [place] [address]    Culture (A)  Final    STAPHYLOCOCCUS AUREUS SUSCEPTIBILITIES PERFORMED ON PREVIOUS CULTURE WITHIN THE LAST 5 DAYS. Performed at [facility], [place] [address]  [place], [place] [address]    Report Status 02/13/2019 FINAL  Final  Culture, blood (routine x 2)     Status: None   Collection Time: 02/11/19  3:57 PM   Specimen: BLOOD  Result Value Ref Range Status   Specimen Description BLOOD PORTA CATH  Final   Special Requests   Final    BOTTLES DRAWN AEROBIC AND ANAEROBIC Blood Culture adequate volume   Culture   Final    NO GROWTH 5 DAYS Performed at [facility], [address], [place], [place] [address]    Report Status 02/16/2019 FINAL  Final  CULTURE, BLOOD (ROUTINE X 2)  w Reflex to ID Panel     Status: Abnormal   Collection Time: 02/13/19  2:18 PM   Specimen: BLOOD LEFT HAND  Result Value Ref Range Status   Specimen Description BLOOD LEFT HAND  Final   Special Requests   Final    BOTTLES DRAWN AEROBIC AND ANAEROBIC Blood Culture results may not be optimal due to an inadequate volume of blood received in culture bottles   Culture  Setup Time   Final    AEROBIC BOTTLE ONLY GRAM POSITIVE COCCI IN CLUSTERS CRITICAL RESULT CALLED TO, READ BACK BY AND VERIFIED WITH: Herbert Pun AT [id number] ON 02/14/2019 [place]. Performed at [facility], [place] [address], [place], [place] [address]    Culture METHICILLIN RESISTANT STAPHYLOCOCCUS AUREUS (A)  Final   Report Status 02/16/2019 FINAL  Final   Organism ID, Bacteria METHICILLIN RESISTANT STAPHYLOCOCCUS AUREUS  Final      Susceptibility   Methicillin resistant staphylococcus aureus - MIC*    CIPROFLOXACIN >=8 RESISTANT Resistant     ERYTHROMYCIN >=8 RESISTANT Resistant     GENTAMICIN <=0.5 SENSITIVE Sensitive     OXACILLIN >=4 RESISTANT Resistant     TETRACYCLINE <=1 SENSITIVE Sensitive     VANCOMYCIN <=0.5 SENSITIVE Sensitive     TRIMETH/SULFA <=10 SENSITIVE Sensitive     CLINDAMYCIN RESISTANT Resistant     RIFAMPIN <=0.5 SENSITIVE Sensitive     Inducible Clindamycin POSITIVE Resistant     * METHICILLIN RESISTANT STAPHYLOCOCCUS AUREUS  Blood Culture ID Panel (Reflexed)     Status:  Abnormal   Collection Time: 02/13/19  2:18 PM  Result Value Ref Range Status   Enterococcus species NOT DETECTED NOT DETECTED Final   Listeria monocytogenes NOT DETECTED NOT DETECTED Final   Staphylococcus species DETECTED (A) NOT DETECTED Final    Comment: CRITICAL RESULT CALLED TO, READ BACK BY AND VERIFIED WITH: ABBEY ELLINGTON AT [id number] ON 02/14/2019 [place].    Staphylococcus aureus (BCID) DETECTED (A) NOT DETECTED Final    Comment: Methicillin (oxacillin)-resistant Staphylococcus aureus (MRSA). MRSA is predictably resistant to beta-lactam antibiotics (except ceftaroline). Preferred therapy is vancomycin unless clinically contraindicated. Patient requires contact precautions if  hospitalized. CRITICAL RESULT CALLED TO, READ BACK BY AND VERIFIED WITH: Herbert Pun AT [id number] ON 02/14/2019 [place].    Methicillin resistance DETECTED (A) NOT DETECTED Final    Comment: CRITICAL RESULT CALLED TO, READ BACK BY AND VERIFIED WITH: Herbert Pun AT [id number] ON 02/14/2019 [place].    Streptococcus species NOT DETECTED NOT DETECTED Final   Streptococcus agalactiae NOT DETECTED NOT DETECTED Final   Streptococcus pneumoniae NOT DETECTED NOT DETECTED Final   Streptococcus pyogenes NOT DETECTED NOT DETECTED Final   Acinetobacter baumannii NOT DETECTED NOT DETECTED Final   Enterobacteriaceae species NOT DETECTED NOT DETECTED Final   Enterobacter cloacae complex NOT DETECTED NOT DETECTED Final   Escherichia coli NOT DETECTED NOT DETECTED Final   Klebsiella oxytoca NOT DETECTED NOT DETECTED Final   Klebsiella pneumoniae NOT DETECTED NOT DETECTED Final   Proteus species NOT DETECTED NOT DETECTED Final   Serratia marcescens NOT DETECTED NOT DETECTED Final   Haemophilus influenzae NOT DETECTED NOT DETECTED Final   Neisseria meningitidis NOT DETECTED NOT DETECTED Final   Pseudomonas aeruginosa NOT DETECTED NOT DETECTED Final   Candida albicans NOT DETECTED NOT DETECTED Final   Candida glabrata NOT DETECTED NOT  DETECTED Final   Candida krusei NOT DETECTED NOT DETECTED Final   Candida parapsilosis NOT DETECTED NOT DETECTED Final   Candida tropicalis NOT DETECTED  NOT DETECTED Final    Comment: Performed at [facility] [address] [place], [place] [address]  CULTURE, BLOOD (ROUTINE X 2) w Reflex to ID Panel     Status: None   Collection Time: 02/13/19  3:09 PM   Specimen: BLOOD  Result Value Ref Range Status   Specimen Description BLOOD BLOOD LEFT HAND  Final   Special Requests   Final    BOTTLES DRAWN AEROBIC AND ANAEROBIC Blood Culture adequate volume   Culture   Final    NO GROWTH 5 DAYS Performed at [facility], [address], [place], [place] [address]    Report Status 02/18/2019 FINAL  Final  Culture, blood (routine x 2)     Status: Abnormal   Collection Time: 02/15/19 12:49 PM   Specimen: BLOOD  Result Value Ref Range Status   Specimen Description   Final    BLOOD LT HAND Performed at [facility], [address], [place], [place] [address]    Special Requests   Final    BOTTLES DRAWN AEROBIC AND ANAEROBIC Blood Culture results may not be optimal due to an inadequate volume of blood received in culture bottles Performed at [facility], [address] [place], [place] [address]    Culture  Setup Time   Final    GRAM POSITIVE COCCI IN BOTH AEROBIC AND ANAEROBIC BOTTLES CRITICAL RESULT CALLED TO, READ BACK BY AND VERIFIED WITHEleonore Chiquito AT [id number] 02/16/2019 [place] Performed at [facility], [address] [place], [place] [address]    Culture (A)  Final    METHICILLIN RESISTANT STAPHYLOCOCCUS AUREUS STAPHYLOCOCCUS SPECIES (COAGULASE NEGATIVE) THE SIGNIFICANCE OF ISOLATING THIS ORGANISM FROM A SINGLE SET OF BLOOD CULTURES WHEN MULTIPLE SETS ARE DRAWN IS UNCERTAIN. PLEASE NOTIFY THE MICROBIOLOGY DEPARTMENT WITHIN ONE WEEK IF SPECIATION AND SENSITIVITIES ARE REQUIRED. Performed at [facility], [place],  [place], [place] [address]    Report Status 02/18/2019 FINAL  Final   Organism ID, Bacteria METHICILLIN RESISTANT STAPHYLOCOCCUS AUREUS  Final      Susceptibility   Methicillin resistant staphylococcus aureus - MIC*    CIPROFLOXACIN >=8 RESISTANT Resistant     ERYTHROMYCIN >=8 RESISTANT Resistant     GENTAMICIN <=0.5 SENSITIVE Sensitive     OXACILLIN >=4 RESISTANT Resistant     TETRACYCLINE <=1 SENSITIVE Sensitive     VANCOMYCIN <=0.5 SENSITIVE Sensitive     TRIMETH/SULFA <=10 SENSITIVE Sensitive     CLINDAMYCIN RESISTANT Resistant     RIFAMPIN <=0.5 SENSITIVE Sensitive     Inducible Clindamycin POSITIVE Resistant     * METHICILLIN RESISTANT STAPHYLOCOCCUS AUREUS  Aerobic Culture (superficial specimen)     Status: None   Collection Time: 02/17/19  1:31 PM   Specimen: Heel; Wound  Result Value Ref Range Status   Specimen Description   Final    HEEL Performed at [facility], [address], [place], [place] [address]    Special Requests   Final    NONE Performed at [facility], [address] [place], [place] [address]    Gram Stain   Final    NO WBC SEEN MODERATE GRAM POSITIVE COCCI IN PAIRS    Culture   Final    ABUNDANT METHICILLIN RESISTANT STAPHYLOCOCCUS AUREUS ABUNDANT DIPHTHEROIDS(CORYNEBACTERIUM SPECIES) Standardized susceptibility testing for this organism is not available. Performed at [facility], [place] [address], [place],  [address]    Report Status 02/19/2019 FINAL  Final  Organism ID, Bacteria METHICILLIN RESISTANT STAPHYLOCOCCUS AUREUS  Final      Susceptibility   Methicillin resistant staphylococcus aureus - MIC*    CIPROFLOXACIN >=8 RESISTANT Resistant     ERYTHROMYCIN >=8 RESISTANT Resistant     GENTAMICIN <=0.5 SENSITIVE Sensitive     OXACILLIN >=4 RESISTANT Resistant     TETRACYCLINE <=1 SENSITIVE Sensitive     VANCOMYCIN 1 SENSITIVE Sensitive     TRIMETH/SULFA <=10 SENSITIVE Sensitive     CLINDAMYCIN RESISTANT  Resistant     RIFAMPIN <=0.5 SENSITIVE Sensitive     Inducible Clindamycin POSITIVE Resistant     * ABUNDANT METHICILLIN RESISTANT STAPHYLOCOCCUS AUREUS  CULTURE, BLOOD (ROUTINE X 2) w Reflex to ID Panel     Status: None (Preliminary result)   Collection Time: 02/18/19 11:29 PM   Specimen: BLOOD  Result Value Ref Range Status   Specimen Description BLOOD LEFT ARM  Final   Special Requests   Final    BOTTLES DRAWN AEROBIC AND ANAEROBIC Blood Culture adequate volume   Culture   Final    NO GROWTH 2 DAYS Performed at [facility], [address] [place], [place] [address]    Report Status PENDING  Incomplete  CULTURE, BLOOD (ROUTINE X 2) w Reflex to ID Panel     Status: None (Preliminary result)   Collection Time: 02/18/19 11:29 PM   Specimen: BLOOD  Result Value Ref Range Status   Specimen Description BLOOD LEFT ARM  Final   Special Requests   Final    BOTTLES DRAWN AEROBIC AND ANAEROBIC Blood Culture adequate volume   Culture   Final    NO GROWTH 2 DAYS Performed at [facility], [address], [place], [place] [address]    Report Status PENDING  Incomplete         Radiology Studies: DG Foot 2 Views Left  Result Date: 02/18/2019 CLINICAL DATA:  Left heel wound.  Rule out osteomyelitis. EXAM: LEFT FOOT - 2 VIEW COMPARISON:  None. FINDINGS: Plantar calcaneal spur. No bone destruction or lucency to suggest osteomyelitis. No acute bony abnormality. Specifically, no fracture, subluxation, or dislocation. Vascular calcifications. IMPRESSION: Calcaneal spur.  No evidence of osteomyelitis. Electronically Signed   By: Rolm Baptise M.D.   On: 02/18/2019 18:18   [place] EKG SITE RITE  Result Date: 02/20/2019 If Site Rite image not attached, placement could not be confirmed due to current cardiac rhythm.       Scheduled Meds: . [START ON 02/24/2019] apixaban  2.5 mg Oral BID   Followed by  . apixaban  10 mg Oral BID  . aspirin  81 mg Per Tube Daily  .  chlorhexidine  15 mL Mouth Rinse BID  . Chlorhexidine Gluconate Cloth  6 each Topical Q0600  . epoetin (EPOGEN/PROCRIT) injection  4,000 Units Intravenous Q M,W,F-HD  . feeding supplement (NEPRO CARB STEADY)  237 mL Oral TID BM  . FLUoxetine  20 mg Per Tube Daily  . fluticasone  2 spray Each Nare Daily  . insulin aspart  0-9 Units Subcutaneous TID WC  . levothyroxine  88 mcg Per Tube QAC breakfast  . mouth rinse  15 mL Mouth Rinse q12n4p  . midodrine  10 mg Per Tube TID WC  . multivitamin  1 tablet Oral QHS  . pantoprazole (PROTONIX) IV  40 mg Intravenous Q24H  . venlafaxine  75 mg Per Tube BID WC  . zinc sulfate  220 mg Per Tube Daily   Continuous Infusions: .  sodium chloride Stopped (02/20/19 0101)  . ceFTAROline (TEFLARO) IV Stopped (02/20/19 0037)  . DAPTOmycin (CUBICIN)  IV Stopped (02/18/19 2358)     LOS: 11 days    Time spent: 25mins    Sidney Ace, MD [facility] Pager: [phone]  If 7PM-7AM, please contact night-coverage [email or web address]  02/20/2019, 2:11 PM

## 2019-02-20 NOTE — Progress Notes (Signed)
PICC RN spoke with patient RN regarding PICC order.  Patient is being followed by renal.  PICC RN informed patient RN that IV team does not place PICC in renal patients unless approved by Nephrology.  Order specifies place IJ PICC and avoid upper extremity veins, patient RN informed that Interventional radiology can place this type of line or the MD can place a CVC.  Patient RN verbalized understanding and will contact patient MD.

## 2019-02-20 NOTE — Progress Notes (Signed)
HD Tx started w/o complication    0000000 1200  Vital Signs  Pulse Rate 72  Pulse Rate Source Monitor  Resp 19  BP (!) 119/59  BP Location Right Leg  BP Method Automatic  Patient Position (if appropriate) Lying  Oxygen Therapy  SpO2 99 %  O2 Device Room Air  Pulse Oximetry Type Continuous  During Hemodialysis Assessment  Blood Flow Rate (mL/min) 400 mL/min  Arterial Pressure (mmHg) -150 mmHg  Venous Pressure (mmHg) 110 mmHg  Transmembrane Pressure (mmHg) 60 mmHg  Ultrafiltration Rate (mL/min) 660 mL/min  Dialysate Flow Rate (mL/min) 600 ml/min  Conductivity: Machine  13.8  HD Safety Checks Performed Yes  Dialysis Fluid Bolus Normal Saline  Bolus Amount (mL) 250 mL  Intra-Hemodialysis Comments Tx initiated  Fistula / Graft Right Upper arm Arteriovenous vein graft  Placement Date/Time: 02/03/16 1210   Placed prior to admission: No  Orientation: Right  Access Location: Upper arm  Access Type: Arteriovenous vein graft  Status Accessed  Needle Size 15g  Drainage Description Sanguineous

## 2019-02-20 NOTE — Consult Note (Signed)
ANTICOAGULATION CONSULT NOTE  Pharmacy Consult for enoxaparin Indication: DVT   Patient Measurements: Height: 5\' 4"  (162.6 cm) Weight: 165 lb 2 oz (74.9 kg) IBW/kg (Calculated) : 54.7 Heparin Dosing Weight: 71 kg  Vital Signs: Temp: 98.5 F (36.9 C) (12/18 0424) Temp Source: Oral (12/18 0424) BP: 105/52 (12/18 1440) Pulse Rate: 69 (12/18 1440)  Labs: Recent Labs    02/17/19 2011 02/18/19 0215 02/18/19 2329 02/19/19 2119  HGB  --  9.4*  --  8.7*  HCT  --  28.4*  --  25.6*  PLT  --  177  --  218  APTT >160* 109* >160*  --   LABPROT 15.6*  --   --   --   INR 1.3*  --   --   --   HEPARINUNFRC 0.68 0.29*  --   --   CREATININE  --  3.95*  --  3.29*    Estimated Creatinine Clearance: 16.9 mL/min (A) (by C-G formula based on SCr of 3.29 mg/dL (H)).   Medical History: Past Medical History:  Diagnosis Date  . Anemia   . Chronic diastolic CHF (congestive heart failure) ([place])    a. Due to ischemic cardiomyopathy. EF as low as 35%, improved to normal s/p CABG; b. echo 07/06/13: EF 55-60%, no RWMAs, mod TR, trivial pericardial effusion not c/w tamponade physiology;  c. 10/2015 Echo: EF 65%, Gr1 DD, triv AI, mild MR, mildly dil LA, mod TR, PASP 66mmHg.  Marland Kitchen Coronary artery disease    a. NSTEMI 06/2013; b.cath: severe three-vessel CAD w/ EF 30% & mild-mod MR; c. s/p 3 vessel CABG 07/02/13 (LIMA-LAD, SVG-OM, and SVG-RPDA);  d. 10/2015 MV: no ischemia/infarct.  . Diabetes mellitus without complication ([place])   . Diabetic neuropathy ([place])   . Dialysis patient [facility])    MWF  . ESRD (end stage renal disease) ([place])    a. 12/2015 initiated - mwf dialysis.  Marland Kitchen GERD (gastroesophageal reflux disease)   . Hyperlipidemia   . Hypertension   . Hypothyroidism   . Myocardial infarction ([place]) 2015  . Neuropathy   . Pleural effusion 2015  . Pulmonary hypertension ([place])   . Renal insufficiency   . Wears dentures    full lower    Medications:  Scheduled:  . [START ON 02/24/2019] apixaban  2.5 mg  Oral BID   Followed by  . apixaban  10 mg Oral BID  . aspirin  81 mg Per Tube Daily  . chlorhexidine  15 mL Mouth Rinse BID  . Chlorhexidine Gluconate Cloth  6 each Topical Q0600  . [START ON 02/23/2019] epoetin (EPOGEN/PROCRIT) injection  4,000 Units Intravenous Q M,W,F-HD  . feeding supplement (NEPRO CARB STEADY)  237 mL Oral TID BM  . FLUoxetine  20 mg Per Tube Daily  . fluticasone  2 spray Each Nare Daily  . insulin aspart  0-9 Units Subcutaneous TID WC  . levothyroxine  88 mcg Per Tube QAC breakfast  . mouth rinse  15 mL Mouth Rinse q12n4p  . midodrine  10 mg Per Tube TID WC  . multivitamin  1 tablet Oral QHS  . pantoprazole (PROTONIX) IV  40 mg Intravenous Q24H  . venlafaxine  75 mg Per Tube BID WC  . zinc sulfate  220 mg Per Tube Daily    Assessment: 63 y.o. female admitted on 02/09/2019 with MRSA  bacteremia. She was on apixaban PTA but last dose was prior to admission (12/7). [place] 12/15 revealed age-indeterminate short segment occlusive DVT involving the  mid humeral aspect of one of the paired brachial veins.  Heparin was started but after repeated attempts by multiple phlebotomists lab has unable to draw labs required for heparin monitoring. Additionally, she is too confused and disoriented to swallow oral medications. While not a preferred method of anticoagulation, there is evidence to suggest that enoxaparin 1 mg/kg 3 times weekly is a reasonable option in this patient   Goal of Therapy:  Monitor platelets by anticoagulation protocol: Yes   Plan:   D/C apixaban  Start enoxaparin 3 times weekly on MWF  We will obtain an anti-Xa level after the 3rd dose  CBC in am if possible  Dallie Piles, PharmD, BCPS Clinical Pharmacist 02/20/2019 3:13 PM

## 2019-02-20 NOTE — Progress Notes (Signed)
HD Tx completed, tolerated well. UF goal met.    02/20/19 1530  Vital Signs  Pulse Rate 73  Pulse Rate Source Monitor  Resp (!) 22  BP (!) 105/45  BP Location Right Arm  BP Method Automatic  Patient Position (if appropriate) Lying  Oxygen Therapy  SpO2 100 %  O2 Device Room Air  Pulse Oximetry Type Continuous  During Hemodialysis Assessment  Blood Flow Rate (mL/min) 400 mL/min  Arterial Pressure (mmHg) -150 mmHg  Venous Pressure (mmHg) 130 mmHg  Transmembrane Pressure (mmHg) 60 mmHg  Ultrafiltration Rate (mL/min) 660 mL/min  Dialysate Flow Rate (mL/min) 600 ml/min  Conductivity: Machine  13.8  HD Safety Checks Performed Yes  KECN 69.3 KECN  Dialysis Fluid Bolus Normal Saline  Bolus Amount (mL) 250 mL  Intra-Hemodialysis Comments Tx completed;Tolerated well

## 2019-02-20 NOTE — Progress Notes (Addendum)
   Patient remains lethargic and is not following commands. Not currently a candidate for TEE. Will take her off the list. Re-consult when/if able to proceed.

## 2019-02-21 DIAGNOSIS — I878 Other specified disorders of veins: Secondary | ICD-10-CM

## 2019-02-21 LAB — BASIC METABOLIC PANEL
Anion gap: 19 — ABNORMAL HIGH (ref 5–15)
BUN: 10 mg/dL (ref 8–23)
CO2: 21 mmol/L — ABNORMAL LOW (ref 22–32)
Calcium: 8.5 mg/dL — ABNORMAL LOW (ref 8.9–10.3)
Chloride: 101 mmol/L (ref 98–111)
Creatinine, Ser: 2.88 mg/dL — ABNORMAL HIGH (ref 0.44–1.00)
GFR calc Af Amer: 19 mL/min — ABNORMAL LOW (ref >60)
GFR calc non Af Amer: 16 mL/min — ABNORMAL LOW (ref >60)
Glucose, Bld: 184 mg/dL — ABNORMAL HIGH (ref 70–99)
Potassium: 3.2 mmol/L — ABNORMAL LOW (ref 3.5–5.1)
Sodium: 141 mmol/L (ref 135–145)

## 2019-02-21 LAB — CBC
HCT: 24.3 % — ABNORMAL LOW (ref 36.0–46.0)
Hemoglobin: 7.7 g/dL — ABNORMAL LOW (ref 12.0–15.0)
MCH: 28 pg (ref 26.0–34.0)
MCHC: 31.7 g/dL (ref 30.0–36.0)
MCV: 88.4 fL (ref 80.0–100.0)
Platelets: 227 10*3/uL (ref 150–400)
RBC: 2.75 MIL/uL — ABNORMAL LOW (ref 3.87–5.11)
RDW: 19.1 % — ABNORMAL HIGH (ref 11.5–15.5)
WBC: 9.6 10*3/uL (ref 4.0–10.5)
nRBC: 0.2 % (ref 0.0–0.2)

## 2019-02-21 LAB — GLUCOSE, CAPILLARY
Glucose-Capillary: 195 mg/dL — ABNORMAL HIGH (ref 70–99)
Glucose-Capillary: 172 mg/dL — ABNORMAL HIGH (ref 70–99)
Glucose-Capillary: 107 mg/dL — ABNORMAL HIGH (ref 70–99)
Glucose-Capillary: 132 mg/dL — ABNORMAL HIGH (ref 70–99)

## 2019-02-21 LAB — MAGNESIUM: Magnesium: 1.7 mg/dL (ref 1.7–2.4)

## 2019-02-21 MED ORDER — VENLAFAXINE HCL 25 MG PO TABS
75.0000 mg | ORAL_TABLET | Freq: Two times a day (BID) | ORAL | Status: DC
  Administered 2019-02-25 – 2019-03-15 (×32): 75 mg via ORAL
  Filled 2019-02-21 (×18): qty 2
  Filled 2019-02-21: qty 3
  Filled 2019-02-21 (×8): qty 2
  Filled 2019-02-21: qty 3
  Filled 2019-02-21: qty 2
  Filled 2019-02-21 (×2): qty 3
  Filled 2019-02-21 (×6): qty 2
  Filled 2019-02-21 (×2): qty 3
  Filled 2019-02-21 (×10): qty 2

## 2019-02-21 MED ORDER — MIDODRINE HCL 5 MG PO TABS
10.0000 mg | ORAL_TABLET | Freq: Three times a day (TID) | ORAL | Status: DC
  Administered 2019-02-25 – 2019-03-14 (×44): 10 mg via ORAL
  Filled 2019-02-21 (×62): qty 2

## 2019-02-21 MED ORDER — ASPIRIN 81 MG PO CHEW
81.0000 mg | CHEWABLE_TABLET | Freq: Every day | ORAL | Status: DC
  Administered 2019-02-25 – 2019-03-15 (×17): 81 mg via ORAL
  Filled 2019-02-21 (×17): qty 1

## 2019-02-21 MED ORDER — FLUOXETINE HCL 20 MG PO CAPS: 20.0000 mg | ORAL_CAPSULE | Freq: Every day | ORAL | Status: DC

## 2019-02-21 MED ORDER — LEVOTHYROXINE SODIUM 88 MCG PO TABS
88.0000 ug | ORAL_TABLET | Freq: Every day | ORAL | Status: DC
  Administered 2019-02-26 – 2019-03-15 (×18): 88 ug via ORAL
  Filled 2019-02-21 (×23): qty 1

## 2019-02-21 MED ORDER — ZINC SULFATE 220 (50 ZN) MG PO CAPS
220.0000 mg | ORAL_CAPSULE | Freq: Every day | ORAL | Status: DC
  Administered 2019-02-25 – 2019-03-15 (×17): 220 mg via ORAL
  Filled 2019-02-21 (×23): qty 1

## 2019-02-21 NOTE — Progress Notes (Signed)
Consult was placed to IV Team for new Peripheral iv; limited to left arm only; significant pitting edema, weeping and skin tears noted; attempted x 1 with ultrasound, with asst from RN;  Very poor access;  Suggest L IJ if continued access is needed.

## 2019-02-21 NOTE — Progress Notes (Signed)
Patient had 1 minute, 2 seconds of apnea episode according to telemetry monitor. Husband is at bedside. I went in and she's resting, was alerted when I touched her arm and said her name, patient looked at me. Respirations now 16, oxygen at 100% room air. MD notified. Will continue to monitor.

## 2019-02-21 NOTE — Progress Notes (Signed)
Jamie Arias  MRN: [id number]  DOB/AGE: [id number] y.o.  Primary Care Physician:Sonnenberg, Angela Adam, MD  Admit date: 02/09/2019  Chief Complaint:  Chief Complaint  Patient presents with  . Altered Mental Status    S-Pt presented on  02/09/2019 with  Chief Complaint  Patient presents with  . Altered Mental Status  . Patient remains confused.  Patient is unable to offer any complaints.  Patient husband was present in the room.  Patient husband voiced no new concerns  Medications . aspirin  81 mg Per Tube Daily  . chlorhexidine  15 mL Mouth Rinse BID  . Chlorhexidine Gluconate Cloth  6 each Topical Q0600  . enoxaparin (LOVENOX) injection  1 mg/kg Subcutaneous Q M,W,F-1800  . [START ON 02/23/2019] epoetin (EPOGEN/PROCRIT) injection  4,000 Units Intravenous Q M,W,F-HD  . feeding supplement (NEPRO CARB STEADY)  237 mL Oral TID BM  . FLUoxetine  20 mg Per Tube Daily  . fluticasone  2 spray Each Nare Daily  . insulin aspart  0-9 Units Subcutaneous TID WC  . levothyroxine  88 mcg Per Tube QAC breakfast  . mouth rinse  15 mL Mouth Rinse q12n4p  . midodrine  10 mg Per Tube TID WC  . multivitamin  1 tablet Oral QHS  . pantoprazole (PROTONIX) IV  40 mg Intravenous Q24H  . venlafaxine  75 mg Per Tube BID WC  . zinc sulfate  220 mg Per Tube Daily         [id number] from the symptoms mentioned above,there are no other symptoms referable to all systems reviewed.  Physical Exam: Vital signs in last 24 hours: Temp:  [98.1 F (36.7 C)-98.6 F (37 C)] 98.1 F (36.7 C) (12/19 0555) Pulse Rate:  [66-91] 85 (12/19 0555) Resp:  [9-23] 18 (12/19 0555) BP: (83-135)/(40-76) 135/62 (12/19 0555) SpO2:  [96 %-100 %] 100 % (12/19 0555) Weight:  [73.4 kg-74.9 kg] 73.4 kg (12/18 1545) Weight change:  Last BM Date: 02/18/19  Intake/Output from previous day: 12/18 0701 - 12/19 0700 In: 250 [IV Piggyback:250] Out: 1500  No intake/output data recorded.   Physical Exam: General-in no  acute distress, patient is chronically ill appearing  Resp- No acute REsp distress, decreased breath sound at bases  CVS- S1S2 regular in rate and rhythm GIT- BS+, soft, NT, ND EXT- NO LE Edema, No Cyanosis Access patient has right AV fistula  Lab Results: CBC Recent Labs    02/19/19 2119  WBC 15.9*  HGB 8.7*  HCT 25.6*  PLT 218    BMET Recent Labs    02/19/19 2119  NA 142  K 3.0*  CL 100  CO2 28  GLUCOSE 119*  BUN 11  CREATININE 3.29*  CALCIUM 8.4*    MICRO Recent Results (from the past 240 hour(s))  Culture, blood (routine x 2)     Status: Abnormal   Collection Time: 02/11/19 12:31 PM   Specimen: BLOOD  Result Value Ref Range Status   Specimen Description   Final    BLOOD PORTA CATH Performed at [facility] [address] [place], [place] [address]    Special Requests   Final    BOTTLES DRAWN AEROBIC AND ANAEROBIC Blood Culture adequate volume Performed at [facility], [place] [place], [place] [address]    Culture  Setup Time   Final    GRAM POSITIVE COCCI ANAEROBIC BOTTLE ONLY CRITICAL RESULT CALLED TO, READ BACK BY AND VERIFIED WITH: SCOTT HALL AT [id number] 02/12/2019  [place]  Performed at [facility], [address] [place], [place] [address]    Culture (A)  Final    STAPHYLOCOCCUS AUREUS SUSCEPTIBILITIES PERFORMED ON PREVIOUS CULTURE WITHIN THE LAST 5 DAYS. Performed at [facility], [place] [address] [place], [place] [address]    Report Status 02/13/2019 FINAL  Final  Culture, blood (routine x 2)     Status: None   Collection Time: 02/11/19  3:57 PM   Specimen: BLOOD  Result Value Ref Range Status   Specimen Description BLOOD PORTA CATH  Final   Special Requests   Final    BOTTLES DRAWN AEROBIC AND ANAEROBIC Blood Culture adequate volume   Culture   Final    NO GROWTH 5 DAYS Performed at [facility], [address] [place], [place] [address]    Report Status 02/16/2019 FINAL  Final  CULTURE,  BLOOD (ROUTINE X 2) w Reflex to ID Panel     Status: Abnormal   Collection Time: 02/13/19  2:18 PM   Specimen: BLOOD LEFT HAND  Result Value Ref Range Status   Specimen Description BLOOD LEFT HAND  Final   Special Requests   Final    BOTTLES DRAWN AEROBIC AND ANAEROBIC Blood Culture results may not be optimal due to an inadequate volume of blood received in culture bottles   Culture  Setup Time   Final    AEROBIC BOTTLE ONLY GRAM POSITIVE COCCI IN CLUSTERS CRITICAL RESULT CALLED TO, READ BACK BY AND VERIFIED WITH: Herbert Pun AT [id number] ON 02/14/2019 [place]. Performed at [facility], [place] [address] [place],  [address]    Culture METHICILLIN RESISTANT STAPHYLOCOCCUS AUREUS (A)  Final   Report Status 02/16/2019 FINAL  Final   Organism ID, Bacteria METHICILLIN RESISTANT STAPHYLOCOCCUS AUREUS  Final      Susceptibility   Methicillin resistant staphylococcus aureus - MIC*    CIPROFLOXACIN >=8 RESISTANT Resistant     ERYTHROMYCIN >=8 RESISTANT Resistant     GENTAMICIN <=0.5 SENSITIVE Sensitive     OXACILLIN >=4 RESISTANT Resistant     TETRACYCLINE <=1 SENSITIVE Sensitive     VANCOMYCIN <=0.5 SENSITIVE Sensitive     TRIMETH/SULFA <=10 SENSITIVE Sensitive     CLINDAMYCIN RESISTANT Resistant     RIFAMPIN <=0.5 SENSITIVE Sensitive     Inducible Clindamycin POSITIVE Resistant     * METHICILLIN RESISTANT STAPHYLOCOCCUS AUREUS  Blood Culture ID Panel (Reflexed)     Status: Abnormal   Collection Time: 02/13/19  2:18 PM  Result Value Ref Range Status   Enterococcus species NOT DETECTED NOT DETECTED Final   Listeria monocytogenes NOT DETECTED NOT DETECTED Final   Staphylococcus species DETECTED (A) NOT DETECTED Final    Comment: CRITICAL RESULT CALLED TO, READ BACK BY AND VERIFIED WITH: ABBEY ELLINGTON AT [id number] ON 02/14/2019 [place].    Staphylococcus aureus (BCID) DETECTED (A) NOT DETECTED Final    Comment: Methicillin (oxacillin)-resistant Staphylococcus aureus (MRSA). MRSA is  predictably resistant to beta-lactam antibiotics (except ceftaroline). Preferred therapy is vancomycin unless clinically contraindicated. Patient requires contact precautions if  hospitalized. CRITICAL RESULT CALLED TO, READ BACK BY AND VERIFIED WITH: Herbert Pun AT [id number] ON 02/14/2019 [place].    Methicillin resistance DETECTED (A) NOT DETECTED Final    Comment: CRITICAL RESULT CALLED TO, READ BACK BY AND VERIFIED WITH: Herbert Pun AT [id number] ON 02/14/2019 [place].    Streptococcus species NOT DETECTED NOT DETECTED Final   Streptococcus agalactiae NOT DETECTED NOT DETECTED Final   Streptococcus pneumoniae  NOT DETECTED NOT DETECTED Final   Streptococcus pyogenes NOT DETECTED NOT DETECTED Final   Acinetobacter baumannii NOT DETECTED NOT DETECTED Final   Enterobacteriaceae species NOT DETECTED NOT DETECTED Final   Enterobacter cloacae complex NOT DETECTED NOT DETECTED Final   Escherichia coli NOT DETECTED NOT DETECTED Final   Klebsiella oxytoca NOT DETECTED NOT DETECTED Final   Klebsiella pneumoniae NOT DETECTED NOT DETECTED Final   Proteus species NOT DETECTED NOT DETECTED Final   Serratia marcescens NOT DETECTED NOT DETECTED Final   Haemophilus influenzae NOT DETECTED NOT DETECTED Final   Neisseria meningitidis NOT DETECTED NOT DETECTED Final   Pseudomonas aeruginosa NOT DETECTED NOT DETECTED Final   Candida albicans NOT DETECTED NOT DETECTED Final   Candida glabrata NOT DETECTED NOT DETECTED Final   Candida krusei NOT DETECTED NOT DETECTED Final   Candida parapsilosis NOT DETECTED NOT DETECTED Final   Candida tropicalis NOT DETECTED NOT DETECTED Final    Comment: Performed at [facility], [address] [place], [place] [address]  CULTURE, BLOOD (ROUTINE X 2) w Reflex to ID Panel     Status: None   Collection Time: 02/13/19  3:09 PM   Specimen: BLOOD  Result Value Ref Range Status   Specimen Description BLOOD BLOOD LEFT HAND  Final   Special Requests   Final    BOTTLES  DRAWN AEROBIC AND ANAEROBIC Blood Culture adequate volume   Culture   Final    NO GROWTH 5 DAYS Performed at [facility], [address] [place], [place] [address]    Report Status 02/18/2019 FINAL  Final  Culture, blood (routine x 2)     Status: Abnormal   Collection Time: 02/15/19 12:49 PM   Specimen: BLOOD  Result Value Ref Range Status   Specimen Description   Final    BLOOD LT HAND Performed at [facility], [address] [place], [place] [address]    Special Requests   Final    BOTTLES DRAWN AEROBIC AND ANAEROBIC Blood Culture results may not be optimal due to an inadequate volume of blood received in culture bottles Performed at [facility], [address] [place], [place] [address]    Culture  Setup Time   Final    GRAM POSITIVE COCCI IN BOTH AEROBIC AND ANAEROBIC BOTTLES CRITICAL RESULT CALLED TO, READ BACK BY AND VERIFIED WITHEleonore Chiquito AT [id number] 02/16/2019 [place] Performed at [facility], [address] [place], [place] [address]    Culture (A)  Final    METHICILLIN RESISTANT STAPHYLOCOCCUS AUREUS STAPHYLOCOCCUS SPECIES (COAGULASE NEGATIVE) THE SIGNIFICANCE OF ISOLATING THIS ORGANISM FROM A SINGLE SET OF BLOOD CULTURES WHEN MULTIPLE SETS ARE DRAWN IS UNCERTAIN. PLEASE NOTIFY THE MICROBIOLOGY DEPARTMENT WITHIN ONE WEEK IF SPECIATION AND SENSITIVITIES ARE REQUIRED. Performed at [facility], [place] [address] [place], [place] [address]    Report Status 02/18/2019 FINAL  Final   Organism ID, Bacteria METHICILLIN RESISTANT STAPHYLOCOCCUS AUREUS  Final      Susceptibility   Methicillin resistant staphylococcus aureus - MIC*    CIPROFLOXACIN >=8 RESISTANT Resistant     ERYTHROMYCIN >=8 RESISTANT Resistant     GENTAMICIN <=0.5 SENSITIVE Sensitive     OXACILLIN >=4 RESISTANT Resistant     TETRACYCLINE <=1 SENSITIVE Sensitive     VANCOMYCIN <=0.5 SENSITIVE Sensitive     TRIMETH/SULFA <=10 SENSITIVE Sensitive      CLINDAMYCIN RESISTANT Resistant     RIFAMPIN <=0.5 SENSITIVE Sensitive     Inducible Clindamycin  POSITIVE Resistant     * METHICILLIN RESISTANT STAPHYLOCOCCUS AUREUS  Aerobic Culture (superficial specimen)     Status: None   Collection Time: 02/17/19  1:31 PM   Specimen: Heel; Wound  Result Value Ref Range Status   Specimen Description   Final    HEEL Performed at [facility], [address] [place], [place] [address]    Special Requests   Final    NONE Performed at [facility], [address] [place], [place] [address]    Gram Stain   Final    NO WBC SEEN MODERATE GRAM POSITIVE COCCI IN PAIRS    Culture   Final    ABUNDANT METHICILLIN RESISTANT STAPHYLOCOCCUS AUREUS ABUNDANT DIPHTHEROIDS(CORYNEBACTERIUM SPECIES) Standardized susceptibility testing for this organism is not available. Performed at [facility], [place] [address] [place], [place] [address]    Report Status 02/19/2019 FINAL  Final   Organism ID, Bacteria METHICILLIN RESISTANT STAPHYLOCOCCUS AUREUS  Final      Susceptibility   Methicillin resistant staphylococcus aureus - MIC*    CIPROFLOXACIN >=8 RESISTANT Resistant     ERYTHROMYCIN >=8 RESISTANT Resistant     GENTAMICIN <=0.5 SENSITIVE Sensitive     OXACILLIN >=4 RESISTANT Resistant     TETRACYCLINE <=1 SENSITIVE Sensitive     VANCOMYCIN 1 SENSITIVE Sensitive     TRIMETH/SULFA <=10 SENSITIVE Sensitive     CLINDAMYCIN RESISTANT Resistant     RIFAMPIN <=0.5 SENSITIVE Sensitive     Inducible Clindamycin POSITIVE Resistant     * ABUNDANT METHICILLIN RESISTANT STAPHYLOCOCCUS AUREUS  CULTURE, BLOOD (ROUTINE X 2) w Reflex to ID Panel     Status: None (Preliminary result)   Collection Time: 02/18/19 11:29 PM   Specimen: BLOOD  Result Value Ref Range Status   Specimen Description BLOOD LEFT ARM  Final   Special Requests   Final    BOTTLES DRAWN AEROBIC AND ANAEROBIC Blood Culture adequate volume   Culture   Final    NO GROWTH 3  DAYS Performed at [facility], [address], [place], [place] [address]    Report Status PENDING  Incomplete  CULTURE, BLOOD (ROUTINE X 2) w Reflex to ID Panel     Status: None (Preliminary result)   Collection Time: 02/18/19 11:29 PM   Specimen: BLOOD  Result Value Ref Range Status   Specimen Description BLOOD LEFT ARM  Final   Special Requests   Final    BOTTLES DRAWN AEROBIC AND ANAEROBIC Blood Culture adequate volume   Culture   Final    NO GROWTH 3 DAYS Performed at [facility], [address], [place],  [address]    Report Status PENDING  Incomplete      Lab Results  Component Value Date   PTH 135 (H) 02/22/2017   CALCIUM 8.4 (L) 02/19/2019   CAION 1.20 07/03/2013   PHOS 2.9 02/19/2019               Impression:  63 y.o.caucasian female with end-stage renal disease, atrial fibrillation, chronic diastolic congestive heart failure, CAD status post PCI, CABG,  type 2 diabetes, hypertension, hyperlipidemia, hypothyroidism, Covid positive in July 2020, recent nursing home stay in [place]  1)Renal ESRD on hemodialysis Patient is on Monday Wednesday Friday schedule as an outpatient  NEPHROLOGIST: Lavonia Dana MD  LOCATION: [place]: M-W-F 2nd Shift  EDW: 81.5 kg.  KIDNEY: [place] Optiflux 180NR  LITERS PROC: liters/treatment  HD TIME: 225  ACCESS: R Upper Arm AVF  NEEDLE SIZE:  ANTICOAG: Custom Heparin 2000  BATH: 2K/2.5Ca  QB: 400 ml/min  QD: 600 ml/min     As an inpatient Patient was dialyzed yesterday No need for hemodialysis today   2) hypotension Blood pressure is stable Patient is on midodrine  3)Anemia of chronic disease  HGb is not at goal (9--11) Patient is on Epogen during dialysis  4) secondary hyperparathyroidism -CKD Mineral-Bone Disorder   Secondary Hyperparathyroidism present  Patient has a history of hyperphosphatemia and hypophosphatemia Patient phosphorus is now at  goal  5) altered mental status Patient has acute metabolic encephalopathy This is most likely secondary to her sepsis This is being closely followed by the primary team   6) hypokalemia Potassium is being replete   7)Acid base Co2 at goal  8) septic shock secondary to MRSA bacteremia Patient was admitted with septic shock Blood cultures were positive for MRSA on  12/7,12/8,12/9,12/11, 12/13 & 12/15 1216 blood culture results are still pending Patient is now clinically better Source of the bacteremia remains unclear Patient has had transthoracic echo as well as CT spine TEE has been planned but patient is confused to give consent and patient is at high risk for anesthesia Hospitalist and ID are following closely  Plan:   No acute indication for renal replacement therapy/dialysis today We will continue to follow    Lindalou Soltis s Mccabe Gloria 02/21/2019, 10:41 AM

## 2019-02-21 NOTE — Progress Notes (Signed)
PROGRESS NOTE    Jamie Arias  [id number] DOB: September 20, 1955 DOA: 02/09/2019 PCP: [name], MD    Brief Narrative:  63 year old female with end-stage renal disease on hemodialysis, admitted to the hospital with severe septic shock second encephalopathic and is unable to consent to the procedure ary to MRSA bacteremia. She had associated metabolic encephalopathy. She is being treated with intravenous vancomycin. She is not requiring any further pressors. Encephalopathy has been slow to improve.  Bacteremia has been persistent without any clear etiology.  CT spine unrevealing for any obvious infectious focus.  Infectious disease following.  Cardiology following.  Plan for inpatient transesophageal echocardiogram currently delayed as patient persistently encephalopathic and is unable to consent for the procedure  12/17: Remains encephalopathic.  Does not provide any reliable history.  Perseverates on the name [name].  12/18: Encephalopathy appears to be worsening.  Patient not eating.  Opens eyes but does not provide any meaningful history.  Off the list for TEE currently given encephalopathy.  Seen by neurology yesterday, discussed with Dr. [name], no acute neurologic findings and encephalopathy most likely related to underlying sepsis   12/19: patient's encephalopathy appears to be worsening.  She is not taking anything by mouth.  She is unable to swallow pills.  She has lost all IV access at this point. ICU contacted who agreed to place femoral central line on the floor.  Husband Mark at bedside.  Elected to change CODE STATUS to DO NOT RESUSCITATE   Assessment & Plan:   Principal Problem:   Sepsis due to methicillin resistant Staphylococcus aureus (MRSA) ([place]) Active Problems:   Diabetes mellitus, type 2 (HCC)   Hypotension   Chronic diastolic CHF (congestive heart failure) (HCC)   ESRD (end stage renal disease) (HCC)   Altered level of consciousness   Pressure injury of  skin   MRSA bacteremia   Arm DVT (deep venous thromboembolism), acute, left (HCC)   Acute encephalopathy   1. Septic shock secondary to MRSA bacteremia.  Shock resolved Source control not achieved Patient remains persistently bacteremic Source of bacteremia is unclear TTE unrevealing CT spine unrevealing for obvious infectious focus TEE planned however patient has been too encephalopathic and cannot consent to the procedure and presents a high risk for anesthesia Staph aureus isolated from heel wound Seen by podiatry, heel wound unlikely source of bacteremia Repeat cultures on 02/18/2019 no growth to date Plan: Continue current antibiotic treatment regimen of daptomycin and ceftaroline Follow blood cultures from 02/18/2019 Continue to monitor mental status and neurologic recovery TEE on hold, unlikely that this will change management ID following   2. End-stage renal disease on hemodialysis.  Nephrology following for dialysis needs. Patient has very poor vascular access She has lost all of her peripheral IVs As you agreed to perform femoral central line on the floor to establish reliable IV access   3. Acute encephalopathy.  Possible sepsis related MRI of the brain did not show any acute infarct.  EEG did not show any evidence of seizures.  She was noted to have twitching in her lower jaw, but her husband reports this is been present for the past several months.   Ammonia level is normal.   ABG does not show significant CO2 retention. -Continue antibiotic regimen for treatment of MRSA bacteremia -Continue to monitor mental status closely, frequent reorientation measures -Neurology consult: 02/19/2019.  Discussed with Dr. [name].  Recommendations appreciated.  Per neurology lumbar puncture is unlikely to add diagnostic value.  Remainder of neurologic  work-up essentially unrevealing.  Persistent encephalopathy likely related to underlying bacteremia and sepsis    -Encephalopathy appears to be worsening despite apparent clearance of bacteremia.  Unknown etiology but likely multifactorial.  We will continue antibiotic therapy however we will have to strongly consider transition to hospice/comfort measures should the patient not recover from his encephalopathy  4. Acute left arm DVT.  Noted on venous Dopplers.  Started on heparin infusion, but this was held due to oozing around central line.   Continue empiric anticoagulation And consider oral anticoagulation if patient is able to discharge  5. History of atrial fibrillation.  She was taking amiodarone and Eliquis prior to admission.  Currently in sinus rhythm.  Continue to follow heart rate.  On heparin for left upper extremity DVT  6. Chronic diastolic congestive heart failure.  Appears to have significant edema.  Can hopefully remove volume with dialysis.  7. Diabetes.  Continue on sliding scale insulin.  Blood sugars are currently stable.  8. Acute on chronic anemia.  Related to blood loss from oozing around IV sites.  Improved after 1 unit prbc on 12/13  9. Goals of care.   With patient's multiple medical issues, poor functional nutritional status, her prognosis is poor.   Spoke with husband [name] at [employer] to change CODE STATUS to DO NOT RESUSCITATE Palliative care to reevaluate patient on Monday 12/21   DVT prophylaxis: Heparin GTT Code Status: Full code Family Communication: spoke with husband [name] at bedside 12/19 Disposition Plan: Pending clinical improvement  Consultants:   ID  Nephrology  Cardiology  Neurology  Procedures:   Right femoral CVC 12/8> removed  Right femoral art line 12/8> removed  Antimicrobials:   Vancomycin 12/7>12/14  Daptomycin 12/14>  ceftaroline 12/14>   Subjective: Patient seen and examined Husband Mark at bedside Patient encephalopathic, did not provide any more reliable history Left femoral central line placed by ICU  at bedside   Objective: Vitals:   02/20/19 1641 02/20/19 1652 02/20/19 1957 02/21/19 0555  BP: 105/62 105/62 (!) 131/58 135/62  Pulse: 88 88 91 85  Resp: 16 16 19 18   Temp: 98.4 F (36.9 C) 98.4 F (36.9 C) 98.2 F (36.8 C) 98.1 F (36.7 C)  TempSrc: Axillary Oral Axillary   SpO2: 100% 100% 100% 100%  Weight:      Height:        Intake/Output Summary (Last 24 hours) at 02/21/2019 1359 Last data filed at 02/21/2019 0018 Gross per 24 hour  Intake 250 ml  Output 1500 ml  Net -1250 ml   Filed Weights   02/18/19 1050 02/20/19 1155 02/20/19 1545  Weight: 74.9 kg 74.9 kg 73.4 kg    Examination:  General exam: somnolent, but wakes up to voice Respiratory system: diminished breath sounds bilaterally. Respiratory effort normal. Cardiovascular system:RRR. No murmurs, rubs, gallops. Gastrointestinal system: Abdomen is nondistended, soft and nontender. No organomegaly or masses felt. Normal bowel sounds heard. Central nervous system: No focal neurological deficits. Extremities: 1-2+ edema bilaterally Skin: No rashes, lesions or ulcers Psychiatry: somnolent.    Data Reviewed: I have personally reviewed following labs and imaging studies  CBC: Recent Labs  Lab 02/15/19 0545 02/15/19 1824 02/16/19 0433 02/18/19 0215 02/19/19 2119 02/21/19 1159  WBC 8.9  --  8.9 10.6* 15.9* 9.6  HGB 6.8* 8.8* 8.7* 9.4* 8.7* 7.7*  HCT 20.0* 25.0* 24.7* 28.4* 25.6* 24.3*  MCV 80.3  --  79.7* 84.3 83.1 88.4  PLT 105*  --  126* 177 218 227  Basic Metabolic Panel: Recent Labs  Lab 02/15/19 0545 02/16/19 0433 02/17/19 0533 02/18/19 0215 02/18/19 1200 02/19/19 2119 02/21/19 1159  NA 138 139 139 137  --  142 141  K 3.6 3.4* 4.0 4.0  --  3.0* 3.2*  CL 102 102 102 100  --  100 101  CO2 24 24 25 23   --  28 21*  GLUCOSE 96 166* 101* 128*  --  119* 184*  BUN 26* 30* 14 18  --  11 10  CREATININE 4.64* 5.30* 3.27* 3.95*  --  3.29* 2.88*  CALCIUM 7.9* 8.2* 8.1* 8.0*  --  8.4* 8.5*  MG  2.2 1.9  --  1.7  --  1.9 1.7  PHOS 2.2* 2.5 1.8* 2.7 3.0 2.9  --    GFR: Estimated Creatinine Clearance: 19.1 mL/min (A) (by C-G formula based on SCr of 2.88 mg/dL (H)). Liver Function Tests: Recent Labs  Lab 02/16/19 0433 02/17/19 0533 02/19/19 2119  ALBUMIN 2.1* 2.0* 2.2*   No results for input(s): LIPASE, AMYLASE in the last 168 hours. Recent Labs  Lab 02/17/19 0533 02/18/19 1334 02/19/19 2119  AMMONIA 16 10 12    Coagulation Profile: Recent Labs  Lab 02/17/19 2011  INR 1.3*   Cardiac Enzymes: Recent Labs  Lab 02/16/19 0433  CKTOTAL 17*   BNP (last 3 results) No results for input(s): PROBNP in the last 8760 hours. HbA1C: No results for input(s): HGBA1C in the last 72 hours. CBG: Recent Labs  Lab 02/20/19 1137 02/20/19 1709 02/20/19 2156 02/21/19 0733 02/21/19 1152  GLUCAP 125* 88 113* 195* 172*   Lipid Profile: No results for input(s): CHOL, HDL, LDLCALC, TRIG, CHOLHDL, LDLDIRECT in the last 72 hours. Thyroid Function Tests: No results for input(s): TSH, T4TOTAL, FREET4, T3FREE, THYROIDAB in the last 72 hours. Anemia Panel: No results for input(s): VITAMINB12, FOLATE, FERRITIN, TIBC, IRON, RETICCTPCT in the last 72 hours. Sepsis Labs: No results for input(s): PROCALCITON, LATICACIDVEN in the last 168 hours.  Recent Results (from the past 240 hour(s))  Culture, blood (routine x 2)     Status: None   Collection Time: 02/11/19  3:57 PM   Specimen: BLOOD  Result Value Ref Range Status   Specimen Description BLOOD PORTA CATH  Final   Special Requests   Final    BOTTLES DRAWN AEROBIC AND ANAEROBIC Blood Culture adequate volume   Culture   Final    NO GROWTH 5 DAYS Performed at [facility], [address] [place], [place] [address]    Report Status 02/16/2019 FINAL  Final  CULTURE, BLOOD (ROUTINE X 2) w Reflex to ID Panel     Status: Abnormal   Collection Time: 02/13/19  2:18 PM   Specimen: BLOOD LEFT HAND  Result Value Ref Range Status    Specimen Description BLOOD LEFT HAND  Final   Special Requests   Final    BOTTLES DRAWN AEROBIC AND ANAEROBIC Blood Culture results may not be optimal due to an inadequate volume of blood received in culture bottles   Culture  Setup Time   Final    AEROBIC BOTTLE ONLY GRAM POSITIVE COCCI IN CLUSTERS CRITICAL RESULT CALLED TO, READ BACK BY AND VERIFIED WITH: Herbert Pun AT [id number] ON 02/14/2019 [place]. Performed at [facility], [place] [address] [place], [place] [address]    Culture METHICILLIN RESISTANT STAPHYLOCOCCUS AUREUS (A)  Final   Report Status 02/16/2019 FINAL  Final   Organism ID, Bacteria METHICILLIN RESISTANT STAPHYLOCOCCUS AUREUS  Final  Susceptibility   Methicillin resistant staphylococcus aureus - MIC*    CIPROFLOXACIN >=8 RESISTANT Resistant     ERYTHROMYCIN >=8 RESISTANT Resistant     GENTAMICIN <=0.5 SENSITIVE Sensitive     OXACILLIN >=4 RESISTANT Resistant     TETRACYCLINE <=1 SENSITIVE Sensitive     VANCOMYCIN <=0.5 SENSITIVE Sensitive     TRIMETH/SULFA <=10 SENSITIVE Sensitive     CLINDAMYCIN RESISTANT Resistant     RIFAMPIN <=0.5 SENSITIVE Sensitive     Inducible Clindamycin POSITIVE Resistant     * METHICILLIN RESISTANT STAPHYLOCOCCUS AUREUS  Blood Culture ID Panel (Reflexed)     Status: Abnormal   Collection Time: 02/13/19  2:18 PM  Result Value Ref Range Status   Enterococcus species NOT DETECTED NOT DETECTED Final   Listeria monocytogenes NOT DETECTED NOT DETECTED Final   Staphylococcus species DETECTED (A) NOT DETECTED Final    Comment: CRITICAL RESULT CALLED TO, READ BACK BY AND VERIFIED WITH: Herbert Pun AT [id number] ON 02/14/2019 [place].    Staphylococcus aureus (BCID) DETECTED (A) NOT DETECTED Final    Comment: Methicillin (oxacillin)-resistant Staphylococcus aureus (MRSA). MRSA is predictably resistant to beta-lactam antibiotics (except ceftaroline). Preferred therapy is vancomycin unless clinically contraindicated. Patient requires contact  precautions if  hospitalized. CRITICAL RESULT CALLED TO, READ BACK BY AND VERIFIED WITH: Herbert Pun AT [id number] ON 02/14/2019 [place].    Methicillin resistance DETECTED (A) NOT DETECTED Final    Comment: CRITICAL RESULT CALLED TO, READ BACK BY AND VERIFIED WITH: Herbert Pun AT [id number] ON 02/14/2019 [place].    Streptococcus species NOT DETECTED NOT DETECTED Final   Streptococcus agalactiae NOT DETECTED NOT DETECTED Final   Streptococcus pneumoniae NOT DETECTED NOT DETECTED Final   Streptococcus pyogenes NOT DETECTED NOT DETECTED Final   Acinetobacter baumannii NOT DETECTED NOT DETECTED Final   Enterobacteriaceae species NOT DETECTED NOT DETECTED Final   Enterobacter cloacae complex NOT DETECTED NOT DETECTED Final   Escherichia coli NOT DETECTED NOT DETECTED Final   Klebsiella oxytoca NOT DETECTED NOT DETECTED Final   Klebsiella pneumoniae NOT DETECTED NOT DETECTED Final   Proteus species NOT DETECTED NOT DETECTED Final   Serratia marcescens NOT DETECTED NOT DETECTED Final   Haemophilus influenzae NOT DETECTED NOT DETECTED Final   Neisseria meningitidis NOT DETECTED NOT DETECTED Final   Pseudomonas aeruginosa NOT DETECTED NOT DETECTED Final   Candida albicans NOT DETECTED NOT DETECTED Final   Candida glabrata NOT DETECTED NOT DETECTED Final   Candida krusei NOT DETECTED NOT DETECTED Final   Candida parapsilosis NOT DETECTED NOT DETECTED Final   Candida tropicalis NOT DETECTED NOT DETECTED Final    Comment: Performed at [facility], [address] [place], [place] [address]  CULTURE, BLOOD (ROUTINE X 2) w Reflex to ID Panel     Status: None   Collection Time: 02/13/19  3:09 PM   Specimen: BLOOD  Result Value Ref Range Status   Specimen Description BLOOD BLOOD LEFT HAND  Final   Special Requests   Final    BOTTLES DRAWN AEROBIC AND ANAEROBIC Blood Culture adequate volume   Culture   Final    NO GROWTH 5 DAYS Performed at [facility] [address]  [place], [place] [address]    Report Status 02/18/2019 FINAL  Final  Culture, blood (routine x 2)     Status: Abnormal   Collection Time: 02/15/19 12:49 PM   Specimen: BLOOD  Result Value Ref Range Status   Specimen Description   Final    BLOOD LT  HAND Performed at [facility], [address] [place], [place] [address]    Special Requests   Final    BOTTLES DRAWN AEROBIC AND ANAEROBIC Blood Culture results may not be optimal due to an inadequate volume of blood received in culture bottles Performed at [facility], [address] [place], [place] [address]    Culture  Setup Time   Final    GRAM POSITIVE COCCI IN BOTH AEROBIC AND ANAEROBIC BOTTLES CRITICAL RESULT CALLED TO, READ BACK BY AND VERIFIED WITHEleonore Chiquito AT [id number] 02/16/2019 [place] Performed at [facility], [address] [place], [place] [address]    Culture (A)  Final    METHICILLIN RESISTANT STAPHYLOCOCCUS AUREUS STAPHYLOCOCCUS SPECIES (COAGULASE NEGATIVE) THE SIGNIFICANCE OF ISOLATING THIS ORGANISM FROM A SINGLE SET OF BLOOD CULTURES WHEN MULTIPLE SETS ARE DRAWN IS UNCERTAIN. PLEASE NOTIFY THE MICROBIOLOGY DEPARTMENT WITHIN ONE WEEK IF SPECIATION AND SENSITIVITIES ARE REQUIRED. Performed at [facility], [place] [address], [place], [place] [address]    Report Status 02/18/2019 FINAL  Final   Organism ID, Bacteria METHICILLIN RESISTANT STAPHYLOCOCCUS AUREUS  Final      Susceptibility   Methicillin resistant staphylococcus aureus - MIC*    CIPROFLOXACIN >=8 RESISTANT Resistant     ERYTHROMYCIN >=8 RESISTANT Resistant     GENTAMICIN <=0.5 SENSITIVE Sensitive     OXACILLIN >=4 RESISTANT Resistant     TETRACYCLINE <=1 SENSITIVE Sensitive     VANCOMYCIN <=0.5 SENSITIVE Sensitive     TRIMETH/SULFA <=10 SENSITIVE Sensitive     CLINDAMYCIN RESISTANT Resistant     RIFAMPIN <=0.5 SENSITIVE Sensitive     Inducible Clindamycin POSITIVE Resistant     * METHICILLIN RESISTANT STAPHYLOCOCCUS AUREUS   Aerobic Culture (superficial specimen)     Status: None   Collection Time: 02/17/19  1:31 PM   Specimen: Heel; Wound  Result Value Ref Range Status   Specimen Description   Final    HEEL Performed at [facility], [address], [place], [place] [address]    Special Requests   Final    NONE Performed at [facility], [address] [place], [place] [address]    Gram Stain   Final    NO WBC SEEN MODERATE GRAM POSITIVE COCCI IN PAIRS    Culture   Final    ABUNDANT METHICILLIN RESISTANT STAPHYLOCOCCUS AUREUS ABUNDANT DIPHTHEROIDS(CORYNEBACTERIUM SPECIES) Standardized susceptibility testing for this organism is not available. Performed at [facility], [place] [address] [place], [place] [address]    Report Status 02/19/2019 FINAL  Final   Organism ID, Bacteria METHICILLIN RESISTANT STAPHYLOCOCCUS AUREUS  Final      Susceptibility   Methicillin resistant staphylococcus aureus - MIC*    CIPROFLOXACIN >=8 RESISTANT Resistant     ERYTHROMYCIN >=8 RESISTANT Resistant     GENTAMICIN <=0.5 SENSITIVE Sensitive     OXACILLIN >=4 RESISTANT Resistant     TETRACYCLINE <=1 SENSITIVE Sensitive     VANCOMYCIN 1 SENSITIVE Sensitive     TRIMETH/SULFA <=10 SENSITIVE Sensitive     CLINDAMYCIN RESISTANT Resistant     RIFAMPIN <=0.5 SENSITIVE Sensitive     Inducible Clindamycin POSITIVE Resistant     * ABUNDANT METHICILLIN RESISTANT STAPHYLOCOCCUS AUREUS  CULTURE, BLOOD (ROUTINE X 2) w Reflex to ID Panel     Status: None (Preliminary result)   Collection Time: 02/18/19 11:29 PM   Specimen: BLOOD  Result Value Ref Range Status   Specimen Description BLOOD LEFT ARM  Final  Special Requests   Final    BOTTLES DRAWN AEROBIC AND ANAEROBIC Blood Culture adequate volume   Culture   Final    NO GROWTH 3 DAYS Performed at [facility], [address] [place], [place] [address]    Report Status PENDING  Incomplete  CULTURE, BLOOD (ROUTINE X 2) w Reflex to ID  Panel     Status: None (Preliminary result)   Collection Time: 02/18/19 11:29 PM   Specimen: BLOOD  Result Value Ref Range Status   Specimen Description BLOOD LEFT ARM  Final   Special Requests   Final    BOTTLES DRAWN AEROBIC AND ANAEROBIC Blood Culture adequate volume   Culture   Final    NO GROWTH 3 DAYS Performed at [facility], [address], [place], [place] [address]    Report Status PENDING  Incomplete         Radiology Studies: [place] EKG SITE RITE  Result Date: 02/20/2019 If Site Rite image not attached, placement could not be confirmed due to current cardiac rhythm.       Scheduled Meds: . aspirin  81 mg Per Tube Daily  . chlorhexidine  15 mL Mouth Rinse BID  . Chlorhexidine Gluconate Cloth  6 each Topical Q0600  . enoxaparin (LOVENOX) injection  1 mg/kg Subcutaneous Q M,W,F-1800  . [START ON 02/23/2019] epoetin (EPOGEN/PROCRIT) injection  4,000 Units Intravenous Q M,W,F-HD  . feeding supplement (NEPRO CARB STEADY)  237 mL Oral TID BM  . FLUoxetine  20 mg Per Tube Daily  . fluticasone  2 spray Each Nare Daily  . insulin aspart  0-9 Units Subcutaneous TID WC  . levothyroxine  88 mcg Per Tube QAC breakfast  . mouth rinse  15 mL Mouth Rinse q12n4p  . midodrine  10 mg Per Tube TID WC  . multivitamin  1 tablet Oral QHS  . pantoprazole (PROTONIX) IV  40 mg Intravenous Q24H  . venlafaxine  75 mg Per Tube BID WC  . zinc sulfate  220 mg Per Tube Daily   Continuous Infusions: . sodium chloride Stopped (02/20/19 0101)  . ceFTAROline (TEFLARO) IV 300 mg (02/21/19 0534)  . DAPTOmycin (CUBICIN)  IV Stopped (02/20/19 2147)     LOS: 12 days    Time spent: 13mins    Sidney Ace, MD [facility] Pager: [phone]  If 7PM-7AM, please contact night-coverage [email or web address]  02/21/2019, 1:59 PM

## 2019-02-21 NOTE — Procedures (Addendum)
Central Venous Catheter Placement:TRIPLE LUMEN  PCCM has been asked to place central access in a patient with very poor to no venous access.  Patient known to our service from as the use/ICU stay 12/8 through 12/13  Indication:  Patient has limited or no vascular access.  Still receiving IV antibiotics and IV medications.  Underlying encephalopathy.  Consent:emergent  Hand washing performed prior to starting the procedure.   Procedure:   An active timeout was performed and correct patient, name, & ID confirmed.  The femoral veins were chosen for potential central line placement due to extensive ecchymotic areas on neck and chest areas and prior reports of vein thrombosis in this ESRD patient. Patient was positioned correctly for central venous access.  Ultrasound was used for guidance.  Both groins were prepped for potential access.  Attempt was made on the RIGHT femoral area and initially good venous flashback was noted however, due to the death of the femoral vein the needle slipped out.  Attempt to reposition resulted in arterial blood, compression was done and the procedure was aborted on the side.  Patient's vessels are very poor caliber.  Procedure was then shifted to the LEFT femoral area.  Patient was prepped on the LEFT femoral area using strict sterile technique including chlorohexadine preps, sterile drape, sterile gown and sterile gloves.    The area was prepped, draped and anesthetized in the usual sterile manner.  Local anesthetic was used lidocaine 1%, 5 mL.  Utilizing ultrasound to visualize the vasculature for guidance and the using Seldinger technique a triple lumen catheter was placed in LEFT femoral vein There was good blood return, catheter caps were placed on lumens, catheter flushed easily, the line was secured and a sterile dressing and BIO-PATCH applied.    Number of Attempts: 2 see body of report (first attempt on RIGHT femoral  unsuccessful) Complications:none Estimated Blood Loss: Less than 5 mL Chest Radiograph not indicated due to femoral access Operator: Renold Don, MD    C. Derrill Kay, MD [place] [facility]

## 2019-02-22 LAB — GLUCOSE, CAPILLARY
Glucose-Capillary: 201 mg/dL — ABNORMAL HIGH (ref 70–99)
Glucose-Capillary: 149 mg/dL — ABNORMAL HIGH (ref 70–99)
Glucose-Capillary: 121 mg/dL — ABNORMAL HIGH (ref 70–99)
Glucose-Capillary: 122 mg/dL — ABNORMAL HIGH (ref 70–99)

## 2019-02-22 LAB — POTASSIUM: Potassium: 3.1 mmol/L — ABNORMAL LOW (ref 3.5–5.1)

## 2019-02-22 MED ORDER — POTASSIUM CHLORIDE 20 MEQ PO PACK: 40.0000 meq | PACK | Freq: Once | ORAL | Status: DC

## 2019-02-22 NOTE — Progress Notes (Signed)
Pharmacy Electrolyte Monitoring Consult:  Pharmacy consulted to assist in monitoring and replacing electrolytes in this 63 y.o. female admitted on 02/09/2019. Patient with ESRD; last dialysis 12/14. Patient with MRSA bacteremia awaiting TEE. She has a h/o CAD and is s/p CABG  Labs:  Sodium (mmol/L)  Date Value  02/21/2019 141  03/30/2014 140   Potassium (mmol/L)  Date Value  02/22/2019 3.1 (L)  03/30/2014 4.3   Magnesium (mg/dL)  Date Value  02/21/2019 1.7  07/19/2013 1.7 (L)   Phosphorus (mg/dL)  Date Value  02/19/2019 2.9   Calcium (mg/dL)  Date Value  02/21/2019 8.5 (L)   Calcium, Total (mg/dL)  Date Value  03/30/2014 9.0   Albumin (g/dL)  Date Value  02/19/2019 2.2 (L)  02/11/2018 4.2  07/23/2013 2.7 (L)   Corrected Ca: 9.8 mg/dL  Goals of Therapy Given Cardiac History: Potassium 4.0 - 5.1 mmol/L Magnesium 2.0 - 2.4 mg/dL All Other Electrolytes WNL   Plan:  KCL 19meq PO x 1 dose.   The patient is a very difficult, and often impossible stick due to severe edema so lab follow-ups are limited  BMP, magnesium level in am if possible  Pharmacy will continue to monitor and adjust per consult.   Olivia Canter, [facility] 02/22/2019 8:14 AM

## 2019-02-22 NOTE — Progress Notes (Signed)
Jamie Arias  MRN: [id number]  DOB/AGE: November 25, 1955 63 y.o.  Primary Care Physician:Sonnenberg, Angela Adam, MD  Admit date: 02/09/2019  Chief Complaint:  Chief Complaint  Patient presents with  . Altered Mental Status    S-Pt presented on  02/09/2019 with  Chief Complaint  Patient presents with  . Altered Mental Status  . Patient remains confused.  Patient is unable to offer any complaints.  As always patient husband was present in the room.  Patient husband voiced no new concerns  Medications . aspirin  81 mg Oral Daily  . chlorhexidine  15 mL Mouth Rinse BID  . Chlorhexidine Gluconate Cloth  6 each Topical Q0600  . enoxaparin (LOVENOX) injection  1 mg/kg Subcutaneous Q M,W,F-1800  . [START ON 02/23/2019] epoetin (EPOGEN/PROCRIT) injection  4,000 Units Intravenous Q M,W,F-HD  . feeding supplement (NEPRO CARB STEADY)  237 mL Oral TID BM  . fluticasone  2 spray Each Nare Daily  . insulin aspart  0-9 Units Subcutaneous TID WC  . levothyroxine  88 mcg Oral QAC breakfast  . mouth rinse  15 mL Mouth Rinse q12n4p  . midodrine  10 mg Oral TID WC  . multivitamin  1 tablet Oral QHS  . pantoprazole (PROTONIX) IV  40 mg Intravenous Q24H  . potassium chloride  40 mEq Oral Once  . venlafaxine  75 mg Oral BID WC  . zinc sulfate  220 mg Oral Daily         ROS: Unable to get any data    physical Exam: Vital signs in last 24 hours: Temp:  [98.1 F (36.7 C)-98.2 F (36.8 C)] 98.1 F (36.7 C) (12/20 0400) Pulse Rate:  [79-81] 79 (12/20 0400) Resp:  [18-20] 20 (12/20 0400) BP: (114-120)/(53-56) 116/55 (12/20 0400) SpO2:  [100 %] 100 % (12/20 0400) Weight change:  Last BM Date: 02/18/19  Intake/Output from previous day: 12/19 0701 - 12/20 0700 In: 500 [IV Piggyback:500] Out: 0  No intake/output data recorded.   Physical Exam: General-in no acute distress, patient is chronically ill appearing  Resp- No acute REsp distress, decreased breath sound at bases  CVS- S1S2  regular in rate and rhythm GIT- BS+, soft, NT, ND EXT- NO LE Edema, No Cyanosis Access patient has right AV fistula  Lab Results: CBC Recent Labs    02/19/19 2119 02/21/19 1159  WBC 15.9* 9.6  HGB 8.7* 7.7*  HCT 25.6* 24.3*  PLT 218 227    BMET Recent Labs    02/19/19 2119 02/21/19 1159 02/22/19 0531  NA 142 141  --   K 3.0* 3.2* 3.1*  CL 100 101  --   CO2 28 21*  --   GLUCOSE 119* 184*  --   BUN 11 10  --   CREATININE 3.29* 2.88*  --   CALCIUM 8.4* 8.5*  --     MICRO Recent Results (from the past 240 hour(s))  CULTURE, BLOOD (ROUTINE X 2) w Reflex to ID Panel     Status: Abnormal   Collection Time: 02/13/19  2:18 PM   Specimen: BLOOD LEFT HAND  Result Value Ref Range Status   Specimen Description BLOOD LEFT HAND  Final   Special Requests   Final    BOTTLES DRAWN AEROBIC AND ANAEROBIC Blood Culture results may not be optimal due to an inadequate volume of blood received in culture bottles   Culture  Setup Time   Final    AEROBIC BOTTLE ONLY GRAM POSITIVE COCCI IN CLUSTERS CRITICAL RESULT  CALLED TO, READ BACK BY AND VERIFIED WITH: Herbert Pun AT [id number] ON 02/14/2019 [place]. Performed at [facility], [place] [address], [place], [place] [address]    Culture METHICILLIN RESISTANT STAPHYLOCOCCUS AUREUS (A)  Final   Report Status 02/16/2019 FINAL  Final   Organism ID, Bacteria METHICILLIN RESISTANT STAPHYLOCOCCUS AUREUS  Final      Susceptibility   Methicillin resistant staphylococcus aureus - MIC*    CIPROFLOXACIN >=8 RESISTANT Resistant     ERYTHROMYCIN >=8 RESISTANT Resistant     GENTAMICIN <=0.5 SENSITIVE Sensitive     OXACILLIN >=4 RESISTANT Resistant     TETRACYCLINE <=1 SENSITIVE Sensitive     VANCOMYCIN <=0.5 SENSITIVE Sensitive     TRIMETH/SULFA <=10 SENSITIVE Sensitive     CLINDAMYCIN RESISTANT Resistant     RIFAMPIN <=0.5 SENSITIVE Sensitive     Inducible Clindamycin POSITIVE Resistant     * METHICILLIN RESISTANT STAPHYLOCOCCUS AUREUS  Blood  Culture ID Panel (Reflexed)     Status: Abnormal   Collection Time: 02/13/19  2:18 PM  Result Value Ref Range Status   Enterococcus species NOT DETECTED NOT DETECTED Final   Listeria monocytogenes NOT DETECTED NOT DETECTED Final   Staphylococcus species DETECTED (A) NOT DETECTED Final    Comment: CRITICAL RESULT CALLED TO, READ BACK BY AND VERIFIED WITH: ABBEY ELLINGTON AT [id number] ON 02/14/2019 [place].    Staphylococcus aureus (BCID) DETECTED (A) NOT DETECTED Final    Comment: Methicillin (oxacillin)-resistant Staphylococcus aureus (MRSA). MRSA is predictably resistant to beta-lactam antibiotics (except ceftaroline). Preferred therapy is vancomycin unless clinically contraindicated. Patient requires contact precautions if  hospitalized. CRITICAL RESULT CALLED TO, READ BACK BY AND VERIFIED WITH: Herbert Pun AT [id number] ON 02/14/2019 [place].    Methicillin resistance DETECTED (A) NOT DETECTED Final    Comment: CRITICAL RESULT CALLED TO, READ BACK BY AND VERIFIED WITH: Herbert Pun AT [id number] ON 02/14/2019 [place].    Streptococcus species NOT DETECTED NOT DETECTED Final   Streptococcus agalactiae NOT DETECTED NOT DETECTED Final   Streptococcus pneumoniae NOT DETECTED NOT DETECTED Final   Streptococcus pyogenes NOT DETECTED NOT DETECTED Final   Acinetobacter baumannii NOT DETECTED NOT DETECTED Final   Enterobacteriaceae species NOT DETECTED NOT DETECTED Final   Enterobacter cloacae complex NOT DETECTED NOT DETECTED Final   Escherichia coli NOT DETECTED NOT DETECTED Final   Klebsiella oxytoca NOT DETECTED NOT DETECTED Final   Klebsiella pneumoniae NOT DETECTED NOT DETECTED Final   Proteus species NOT DETECTED NOT DETECTED Final   Serratia marcescens NOT DETECTED NOT DETECTED Final   Haemophilus influenzae NOT DETECTED NOT DETECTED Final   Neisseria meningitidis NOT DETECTED NOT DETECTED Final   Pseudomonas aeruginosa NOT DETECTED NOT DETECTED Final   Candida albicans NOT DETECTED NOT DETECTED Final    Candida glabrata NOT DETECTED NOT DETECTED Final   Candida krusei NOT DETECTED NOT DETECTED Final   Candida parapsilosis NOT DETECTED NOT DETECTED Final   Candida tropicalis NOT DETECTED NOT DETECTED Final    Comment: Performed at [facility], [address] [place], [place] [address]  CULTURE, BLOOD (ROUTINE X 2) w Reflex to ID Panel     Status: None   Collection Time: 02/13/19  3:09 PM   Specimen: BLOOD  Result Value Ref Range Status   Specimen Description BLOOD BLOOD LEFT HAND  Final   Special Requests   Final    BOTTLES DRAWN AEROBIC AND ANAEROBIC Blood Culture adequate volume   Culture   Final    NO GROWTH 5  DAYS Performed at [facility], [address] [place], [place] [address]    Report Status 02/18/2019 FINAL  Final  Culture, blood (routine x 2)     Status: Abnormal   Collection Time: 02/15/19 12:49 PM   Specimen: BLOOD  Result Value Ref Range Status   Specimen Description   Final    BLOOD LT HAND Performed at [facility], [address] [place], [place] [address]    Special Requests   Final    BOTTLES DRAWN AEROBIC AND ANAEROBIC Blood Culture results may not be optimal due to an inadequate volume of blood received in culture bottles Performed at [facility], [address] [place], [place] [address]    Culture  Setup Time   Final    GRAM POSITIVE COCCI IN BOTH AEROBIC AND ANAEROBIC BOTTLES CRITICAL RESULT CALLED TO, READ BACK BY AND VERIFIED WITHEleonore Chiquito AT [id number] 02/16/2019 [place] Performed at [facility], [address] [place], [place] [address]    Culture (A)  Final    METHICILLIN RESISTANT STAPHYLOCOCCUS AUREUS STAPHYLOCOCCUS SPECIES (COAGULASE NEGATIVE) THE SIGNIFICANCE OF ISOLATING THIS ORGANISM FROM A SINGLE SET OF BLOOD CULTURES WHEN MULTIPLE SETS ARE DRAWN IS UNCERTAIN. PLEASE NOTIFY THE MICROBIOLOGY DEPARTMENT WITHIN ONE WEEK IF SPECIATION AND SENSITIVITIES ARE REQUIRED. Performed at [facility], [place] [address] [place], [place] [address]    Report Status 02/18/2019 FINAL  Final   Organism ID, Bacteria METHICILLIN RESISTANT STAPHYLOCOCCUS AUREUS  Final      Susceptibility   Methicillin resistant staphylococcus aureus - MIC*    CIPROFLOXACIN >=8 RESISTANT Resistant     ERYTHROMYCIN >=8 RESISTANT Resistant     GENTAMICIN <=0.5 SENSITIVE Sensitive     OXACILLIN >=4 RESISTANT Resistant     TETRACYCLINE <=1 SENSITIVE Sensitive     VANCOMYCIN <=0.5 SENSITIVE Sensitive     TRIMETH/SULFA <=10 SENSITIVE Sensitive     CLINDAMYCIN RESISTANT Resistant     RIFAMPIN <=0.5 SENSITIVE Sensitive     Inducible Clindamycin POSITIVE Resistant     * METHICILLIN RESISTANT STAPHYLOCOCCUS AUREUS  Aerobic Culture (superficial specimen)     Status: None   Collection Time: 02/17/19  1:31 PM   Specimen: Heel; Wound  Result Value Ref Range Status   Specimen Description   Final    HEEL Performed at [facility], [address] [place], [place] [address]    Special Requests   Final    NONE Performed at [facility], [address] [place], [place] [address]    Gram Stain   Final    NO WBC SEEN MODERATE GRAM POSITIVE COCCI IN PAIRS    Culture   Final    ABUNDANT METHICILLIN RESISTANT STAPHYLOCOCCUS AUREUS ABUNDANT DIPHTHEROIDS(CORYNEBACTERIUM SPECIES) Standardized susceptibility testing for this organism is not available. Performed at [facility], [place] [address], [place],  [address]    Report Status 02/19/2019 FINAL  Final   Organism ID, Bacteria METHICILLIN RESISTANT STAPHYLOCOCCUS AUREUS  Final      Susceptibility   Methicillin resistant staphylococcus aureus - MIC*    CIPROFLOXACIN >=8 RESISTANT Resistant     ERYTHROMYCIN >=8 RESISTANT Resistant     GENTAMICIN <=0.5 SENSITIVE Sensitive     OXACILLIN >=4 RESISTANT Resistant     TETRACYCLINE <=1 SENSITIVE Sensitive     VANCOMYCIN 1 SENSITIVE Sensitive     TRIMETH/SULFA <=10 SENSITIVE  Sensitive     CLINDAMYCIN RESISTANT Resistant     RIFAMPIN <=0.5 SENSITIVE  Sensitive     Inducible Clindamycin POSITIVE Resistant     * ABUNDANT METHICILLIN RESISTANT STAPHYLOCOCCUS AUREUS  CULTURE, BLOOD (ROUTINE X 2) w Reflex to ID Panel     Status: None (Preliminary result)   Collection Time: 02/18/19 11:29 PM   Specimen: BLOOD  Result Value Ref Range Status   Specimen Description BLOOD LEFT ARM  Final   Special Requests   Final    BOTTLES DRAWN AEROBIC AND ANAEROBIC Blood Culture adequate volume   Culture   Final    NO GROWTH 4 DAYS Performed at [facility], [address] [place], [place] [address]    Report Status PENDING  Incomplete  CULTURE, BLOOD (ROUTINE X 2) w Reflex to ID Panel     Status: None (Preliminary result)   Collection Time: 02/18/19 11:29 PM   Specimen: BLOOD  Result Value Ref Range Status   Specimen Description BLOOD LEFT ARM  Final   Special Requests   Final    BOTTLES DRAWN AEROBIC AND ANAEROBIC Blood Culture adequate volume   Culture   Final    NO GROWTH 4 DAYS Performed at [facility], [address], [place], [place] [address]    Report Status PENDING  Incomplete      Lab Results  Component Value Date   PTH 135 (H) 02/22/2017   CALCIUM 8.5 (L) 02/21/2019   CAION 1.20 07/03/2013   PHOS 2.9 02/19/2019               Impression:  63 y.o.caucasian female with end-stage renal disease, atrial fibrillation, chronic diastolic congestive heart failure, CAD status post PCI, CABG,  type 2 diabetes, hypertension, hyperlipidemia, hypothyroidism, Covid positive in July 2020, recent nursing home stay in [place]  1)Renal ESRD on hemodialysis Patient is on Monday Wednesday Friday schedule as an outpatient  NEPHROLOGIST: Lavonia Dana MD  LOCATION: [place]: M-W-F 2nd Shift  EDW: 81.5 kg.  KIDNEY: Chase Optiflux 180NR  LITERS PROC: liters/treatment  HD TIME: 225  ACCESS: R Upper Arm AVF  NEEDLE  SIZE:  ANTICOAG: Custom Heparin 2000  BATH: 2K/2.5Ca  QB: 400 ml/min  QD: 600 ml/min     As an inpatient Patient was dialyzed Friday No need for hemodialysis today   2) hypotension Blood pressure is stable Patient is on midodrine  3)Anemia of chronic disease  HGb is not at goal (9--11) Patient is on Epogen during dialysis  4) secondary hyperparathyroidism -CKD Mineral-Bone Disorder   Secondary Hyperparathyroidism present  Patient has a history of hyperphosphatemia and hypophosphatemia Patient phosphorus is now at goal  5) altered mental status Patient has acute metabolic encephalopathy This is most likely secondary to her sepsis This is being closely followed by the primary team   6) hypokalemia Potassium is being replete   7)Acid base Co2 at goal  8) septic shock secondary to MRSA bacteremia Patient was admitted with septic shock Blood cultures were positive for MRSA on  12/7,12/8,12/9,12/11, 12/13 & 12/15 1216 blood culture results have shown no growth so far  patient is now clinically better Source of the bacteremia remains unclear Patient has had transthoracic echo as well as CT spine TEE has been planned but patient is confused to give consent and patient is at high risk for anesthesia Hospitalist and ID are following closely  Plan:   No acute indication for renal replacement therapy/dialysis today We will dialyze patient tomorrow We will use higher K bath as patient is hypokalemic  Jamie Arias s Theador Hawthorne 02/22/2019, 9:44 AM

## 2019-02-22 NOTE — Progress Notes (Signed)
PROGRESS NOTE    Jamie Arias  [id number] DOB: 03-12-1955 DOA: 02/09/2019 PCP: [name], MD    Brief Narrative:  63 year old female with end-stage renal disease on hemodialysis, admitted to the hospital with severe septic shock second encephalopathic and is unable to consent to the procedure ary to MRSA bacteremia. She had associated metabolic encephalopathy. She is being treated with intravenous vancomycin. She is not requiring any further pressors. Encephalopathy has been slow to improve.  Bacteremia has been persistent without any clear etiology.  CT spine unrevealing for any obvious infectious focus.  Infectious disease following.  Cardiology following.  Plan for inpatient transesophageal echocardiogram currently delayed as patient persistently encephalopathic and is unable to consent for the procedure  12/17: Remains encephalopathic.  Does not provide any reliable history.  Perseverates on the name [name].  12/18: Encephalopathy appears to be worsening.  Patient not eating.  Opens eyes but does not provide any meaningful history.  Off the list for TEE currently given encephalopathy.  Seen by neurology yesterday, discussed with Dr. [name], no acute neurologic findings and encephalopathy most likely related to underlying sepsis   12/19: patient's encephalopathy appears to be worsening.  She is not taking anything by mouth.  She is unable to swallow pills.  She has lost all IV access at this point. ICU contacted who agreed to place femoral central line on the floor.  Husband Mark at bedside.  Elected to change CODE STATUS to DO NOT RESUSCITATE  12/20: Patient remains encephalopathic.  Still not taking anything additional.  Femoral central line in place.  Had a brief moment this morning where she did respond to her name did not provide any other meaningful history.   Assessment & Plan:   Principal Problem:   Sepsis due to methicillin resistant Staphylococcus aureus (MRSA)  ([place]) Active Problems:   Diabetes mellitus, type 2 (HCC)   Hypotension   Chronic diastolic CHF (congestive heart failure) (HCC)   ESRD (end stage renal disease) (HCC)   Altered level of consciousness   Pressure injury of skin   MRSA bacteremia   Arm DVT (deep venous thromboembolism), acute, left (HCC)   Acute encephalopathy   1. Septic shock secondary to MRSA bacteremia.  Shock resolved Source control not achieved Patient remains persistently bacteremic Source of bacteremia is unclear TTE unrevealing CT spine unrevealing for obvious infectious focus TEE planned however patient has been too encephalopathic and cannot consent to the procedure and presents a high risk for anesthesia Staph aureus isolated from heel wound Seen by podiatry, heel wound unlikely source of bacteremia Repeat cultures on 02/18/2019 no growth to date Plan: Continue current antibiotic treatment regimen of daptomycin and ceftaroline Follow blood cultures from 02/18/2019 Continue to monitor mental status and neurologic recovery TEE on hold, unlikely that this will change management ID following   2. End-stage renal disease on hemodialysis.  Nephrology following for dialysis needs. Patient has very poor vascular access She has lost all of her peripheral IVs She has a left-sided femoral central venous catheter in place   3. Acute encephalopathy.  Possible sepsis related MRI of the brain did not show any acute infarct.  EEG did not show any evidence of seizures.  She was noted to have twitching in her lower jaw, but her husband reports this is been present for the past several months.   Ammonia level is normal.   ABG does not show significant CO2 retention. -Continue antibiotic regimen for treatment of MRSA bacteremia -Continue to monitor  mental status closely, frequent reorientation measures -Neurology consult: 02/19/2019.  Discussed with Dr. [name].  Recommendations appreciated.  Per neurology lumbar  puncture is unlikely to add diagnostic value.  Remainder of neurologic work-up essentially unrevealing.  Persistent encephalopathy likely related to underlying bacteremia and sepsis  -Encephalopathy appears to be worsening despite apparent clearance of bacteremia.  Unknown etiology but likely multifactorial.  We will continue antibiotic therapy however we will have to strongly consider transition to hospice/comfort measures should the patient not recover from his encephalopathy  4. Acute left arm DVT.  Noted on venous Dopplers.  Started on heparin infusion, but this was held due to oozing around central line.   Continue empiric anticoagulation And consider oral anticoagulation if patient is able to discharge Currently patient is on 3 times a week Lovenox, pharmacy following for anticoagulation monitoring  5. History of atrial fibrillation.  She was taking amiodarone and Eliquis prior to admission.  Currently in sinus rhythm.  Continue to follow heart rate.  On LMWH for left upper extremity DVT  6. Chronic diastolic congestive heart failure.  Appears to have significant edema. Likely multifactorial, cardiogenic versus hypoalbuminemia  Can hopefully remove volume with dialysis.  7. Diabetes.  Continue on sliding scale insulin.  Blood sugars are currently stable.  8. Acute on chronic anemia.  Related to blood loss from oozing around IV sites.  Improved after 1 unit prbc on 12/13  9. Goals of care.   With patient's multiple medical issues, poor functional nutritional status, her prognosis is poor.   Spoke with husband [name] at [employer] to change CODE STATUS to DO NOT RESUSCITATE Palliative care to reevaluate patient on Monday 12/21 12/20: At this point patient is not demonstrating any signs of meaningful neurologic recovery despite apparent clearance of bacteremia.  Will await palliative care evaluation on Monday 12/21 however unfortunately it appears that hospice/palliative care  may be the best option to give patient quality of life   DVT prophylaxis: Heparin GTT Code Status: Full code Family Communication: spoke with husband [name] at bedside 12/19 Disposition Plan: Pending clinical improvement  Consultants:   ID  Nephrology  Cardiology  Neurology  Procedures:   Right femoral CVC 12/8> removed  Right femoral art line 12/8> removed  Antimicrobials:   Vancomycin 12/7>12/14  Daptomycin 12/14>  ceftaroline 12/14>   Subjective: Patient seen and examined Husband Mark at bedside Patient encephalopathic, did not provide any more reliable history Not eating   Objective: Vitals:   02/21/19 1659 02/21/19 2058 02/22/19 0400 02/22/19 1155  BP: (!) 114/53 (!) 120/56 (!) 116/55 (!) 110/40  Pulse: 80 81 79 64  Resp: 18 20 20 20   Temp: 98.2 F (36.8 C) 98.1 F (36.7 C) 98.1 F (36.7 C) 98.2 F (36.8 C)  TempSrc: Oral Oral Oral Oral  SpO2: 100% 100% 100% 100%  Weight:      Height:        Intake/Output Summary (Last 24 hours) at 02/22/2019 1313 Last data filed at 02/22/2019 0500 Gross per 24 hour  Intake 500 ml  Output 0 ml  Net 500 ml   Filed Weights   02/18/19 1050 02/20/19 1155 02/20/19 1545  Weight: 74.9 kg 74.9 kg 73.4 kg    Examination:  General exam: somnolent, but wakes up to voice Respiratory system: diminished breath sounds bilaterally. Respiratory effort normal. Cardiovascular system:RRR. No murmurs, rubs, gallops. Gastrointestinal system: Abdomen is nondistended, soft and nontender. No organomegaly or masses felt. Normal bowel sounds heard. Central nervous system: No focal  neurological deficits. Extremities: 1-2+ edema bilaterally Skin: No rashes, lesions or ulcers Psychiatry: somnolent.   Data Reviewed: I have personally reviewed following labs and imaging studies  CBC: Recent Labs  Lab 02/15/19 1824 02/16/19 0433 02/18/19 0215 02/19/19 2119 02/21/19 1159  WBC  --  8.9 10.6* 15.9* 9.6  HGB 8.8* 8.7* 9.4*  8.7* 7.7*  HCT 25.0* 24.7* 28.4* 25.6* 24.3*  MCV  --  79.7* 84.3 83.1 88.4  PLT  --  126* 177 218 [id number]   Basic Metabolic Panel: Recent Labs  Lab 02/16/19 0433 02/17/19 0533 02/18/19 0215 02/18/19 1200 02/19/19 2119 02/21/19 1159 02/22/19 0531  NA 139 139 137  --  142 141  --   K 3.4* 4.0 4.0  --  3.0* 3.2* 3.1*  CL 102 102 100  --  100 101  --   CO2 24 25 23   --  28 21*  --   GLUCOSE 166* 101* 128*  --  119* 184*  --   BUN 30* 14 18  --  11 10  --   CREATININE 5.30* 3.27* 3.95*  --  3.29* 2.88*  --   CALCIUM 8.2* 8.1* 8.0*  --  8.4* 8.5*  --   MG 1.9  --  1.7  --  1.9 1.7  --   PHOS 2.5 1.8* 2.7 3.0 2.9  --   --    GFR: Estimated Creatinine Clearance: 19.1 mL/min (A) (by C-G formula based on SCr of 2.88 mg/dL (H)). Liver Function Tests: Recent Labs  Lab 02/16/19 0433 02/17/19 0533 02/19/19 2119  ALBUMIN 2.1* 2.0* 2.2*   No results for input(s): LIPASE, AMYLASE in the last 168 hours. Recent Labs  Lab 02/17/19 0533 02/18/19 1334 02/19/19 2119  AMMONIA 16 10 12    Coagulation Profile: Recent Labs  Lab 02/17/19 2011  INR 1.3*   Cardiac Enzymes: Recent Labs  Lab 02/16/19 0433  CKTOTAL 17*   BNP (last 3 results) No results for input(s): PROBNP in the last 8760 hours. HbA1C: No results for input(s): HGBA1C in the last 72 hours. CBG: Recent Labs  Lab 02/21/19 1152 02/21/19 1700 02/21/19 2057 02/22/19 0816 02/22/19 1152  GLUCAP 172* 107* 132* 201* 149*   Lipid Profile: No results for input(s): CHOL, HDL, LDLCALC, TRIG, CHOLHDL, LDLDIRECT in the last 72 hours. Thyroid Function Tests: No results for input(s): TSH, T4TOTAL, FREET4, T3FREE, THYROIDAB in the last 72 hours. Anemia Panel: No results for input(s): VITAMINB12, FOLATE, FERRITIN, TIBC, IRON, RETICCTPCT in the last 72 hours. Sepsis Labs: No results for input(s): PROCALCITON, LATICACIDVEN in the last 168 hours.  Recent Results (from the past 240 hour(s))  CULTURE, BLOOD (ROUTINE X 2) w Reflex  to ID Panel     Status: Abnormal   Collection Time: 02/13/19  2:18 PM   Specimen: BLOOD LEFT HAND  Result Value Ref Range Status   Specimen Description BLOOD LEFT HAND  Final   Special Requests   Final    BOTTLES DRAWN AEROBIC AND ANAEROBIC Blood Culture results may not be optimal due to an inadequate volume of blood received in culture bottles   Culture  Setup Time   Final    AEROBIC BOTTLE ONLY GRAM POSITIVE COCCI IN CLUSTERS CRITICAL RESULT CALLED TO, READ BACK BY AND VERIFIED WITH: Herbert Pun AT [id number] ON 02/14/2019 [place]. Performed at [facility], [place] [address] [place], [place] [address]    Culture METHICILLIN RESISTANT STAPHYLOCOCCUS AUREUS (A)  Final   Report Status 02/16/2019 FINAL  Final   Organism ID, Bacteria METHICILLIN RESISTANT STAPHYLOCOCCUS AUREUS  Final      Susceptibility   Methicillin resistant staphylococcus aureus - MIC*    CIPROFLOXACIN >=8 RESISTANT Resistant     ERYTHROMYCIN >=8 RESISTANT Resistant     GENTAMICIN <=0.5 SENSITIVE Sensitive     OXACILLIN >=4 RESISTANT Resistant     TETRACYCLINE <=1 SENSITIVE Sensitive     VANCOMYCIN <=0.5 SENSITIVE Sensitive     TRIMETH/SULFA <=10 SENSITIVE Sensitive     CLINDAMYCIN RESISTANT Resistant     RIFAMPIN <=0.5 SENSITIVE Sensitive     Inducible Clindamycin POSITIVE Resistant     * METHICILLIN RESISTANT STAPHYLOCOCCUS AUREUS  Blood Culture ID Panel (Reflexed)     Status: Abnormal   Collection Time: 02/13/19  2:18 PM  Result Value Ref Range Status   Enterococcus species NOT DETECTED NOT DETECTED Final   Listeria monocytogenes NOT DETECTED NOT DETECTED Final   Staphylococcus species DETECTED (A) NOT DETECTED Final    Comment: CRITICAL RESULT CALLED TO, READ BACK BY AND VERIFIED WITH: ABBEY ELLINGTON AT [id number] ON 02/14/2019 [place].    Staphylococcus aureus (BCID) DETECTED (A) NOT DETECTED Final    Comment: Methicillin (oxacillin)-resistant Staphylococcus aureus (MRSA). MRSA is predictably resistant to  beta-lactam antibiotics (except ceftaroline). Preferred therapy is vancomycin unless clinically contraindicated. Patient requires contact precautions if  hospitalized. CRITICAL RESULT CALLED TO, READ BACK BY AND VERIFIED WITH: Herbert Pun AT [id number] ON 02/14/2019 [place].    Methicillin resistance DETECTED (A) NOT DETECTED Final    Comment: CRITICAL RESULT CALLED TO, READ BACK BY AND VERIFIED WITH: Herbert Pun AT [id number] ON 02/14/2019 [place].    Streptococcus species NOT DETECTED NOT DETECTED Final   Streptococcus agalactiae NOT DETECTED NOT DETECTED Final   Streptococcus pneumoniae NOT DETECTED NOT DETECTED Final   Streptococcus pyogenes NOT DETECTED NOT DETECTED Final   Acinetobacter baumannii NOT DETECTED NOT DETECTED Final   Enterobacteriaceae species NOT DETECTED NOT DETECTED Final   Enterobacter cloacae complex NOT DETECTED NOT DETECTED Final   Escherichia coli NOT DETECTED NOT DETECTED Final   Klebsiella oxytoca NOT DETECTED NOT DETECTED Final   Klebsiella pneumoniae NOT DETECTED NOT DETECTED Final   Proteus species NOT DETECTED NOT DETECTED Final   Serratia marcescens NOT DETECTED NOT DETECTED Final   Haemophilus influenzae NOT DETECTED NOT DETECTED Final   Neisseria meningitidis NOT DETECTED NOT DETECTED Final   Pseudomonas aeruginosa NOT DETECTED NOT DETECTED Final   Candida albicans NOT DETECTED NOT DETECTED Final   Candida glabrata NOT DETECTED NOT DETECTED Final   Candida krusei NOT DETECTED NOT DETECTED Final   Candida parapsilosis NOT DETECTED NOT DETECTED Final   Candida tropicalis NOT DETECTED NOT DETECTED Final    Comment: Performed at [facility], [address] [place], [place] [address]  CULTURE, BLOOD (ROUTINE X 2) w Reflex to ID Panel     Status: None   Collection Time: 02/13/19  3:09 PM   Specimen: BLOOD  Result Value Ref Range Status   Specimen Description BLOOD BLOOD LEFT HAND  Final   Special Requests   Final    BOTTLES DRAWN AEROBIC AND ANAEROBIC  Blood Culture adequate volume   Culture   Final    NO GROWTH 5 DAYS Performed at [facility], [address] [place], [place] [address]    Report Status 02/18/2019 FINAL  Final  Culture, blood (routine x 2)     Status: Abnormal   Collection Time: 02/15/19 12:49 PM   Specimen: BLOOD  Result Value Ref Range Status   Specimen Description   Final    BLOOD LT HAND Performed at [facility], [address] [place], [place] [address]    Special Requests   Final    BOTTLES DRAWN AEROBIC AND ANAEROBIC Blood Culture results may not be optimal due to an inadequate volume of blood received in culture bottles Performed at [facility], [address] [place], [place] [address]    Culture  Setup Time   Final    GRAM POSITIVE COCCI IN BOTH AEROBIC AND ANAEROBIC BOTTLES CRITICAL RESULT CALLED TO, READ BACK BY AND VERIFIED WITHEleonore Chiquito AT [id number] 02/16/2019 [place] Performed at [name], [address] [place], [place] [address]    Culture (A)  Final    METHICILLIN RESISTANT STAPHYLOCOCCUS AUREUS STAPHYLOCOCCUS SPECIES (COAGULASE NEGATIVE) THE SIGNIFICANCE OF ISOLATING THIS ORGANISM FROM A SINGLE SET OF BLOOD CULTURES WHEN MULTIPLE SETS ARE DRAWN IS UNCERTAIN. PLEASE NOTIFY THE MICROBIOLOGY DEPARTMENT WITHIN ONE WEEK IF SPECIATION AND SENSITIVITIES ARE REQUIRED. Performed at [facility], [place] [address] [place], [place] [address]    Report Status 02/18/2019 FINAL  Final   Organism ID, Bacteria METHICILLIN RESISTANT STAPHYLOCOCCUS AUREUS  Final      Susceptibility   Methicillin resistant staphylococcus aureus - MIC*    CIPROFLOXACIN >=8 RESISTANT Resistant     ERYTHROMYCIN >=8 RESISTANT Resistant     GENTAMICIN <=0.5 SENSITIVE Sensitive     OXACILLIN >=4 RESISTANT Resistant     TETRACYCLINE <=1 SENSITIVE Sensitive     VANCOMYCIN <=0.5 SENSITIVE Sensitive     TRIMETH/SULFA <=10 SENSITIVE Sensitive     CLINDAMYCIN RESISTANT Resistant      RIFAMPIN <=0.5 SENSITIVE Sensitive     Inducible Clindamycin POSITIVE Resistant     * METHICILLIN RESISTANT STAPHYLOCOCCUS AUREUS  Aerobic Culture (superficial specimen)     Status: None   Collection Time: 02/17/19  1:31 PM   Specimen: Heel; Wound  Result Value Ref Range Status   Specimen Description   Final    HEEL Performed at [facility], [address] [place], [place] [address]    Special Requests   Final    NONE Performed at [facility], [address] [place], [place] [address]    Gram Stain   Final    NO WBC SEEN MODERATE GRAM POSITIVE COCCI IN PAIRS    Culture   Final    ABUNDANT METHICILLIN RESISTANT STAPHYLOCOCCUS AUREUS ABUNDANT DIPHTHEROIDS(CORYNEBACTERIUM SPECIES) Standardized susceptibility testing for this organism is not available. Performed at [facility], [place] [address] [place], [place] [address]    Report Status 02/19/2019 FINAL  Final   Organism ID, Bacteria METHICILLIN RESISTANT STAPHYLOCOCCUS AUREUS  Final      Susceptibility   Methicillin resistant staphylococcus aureus - MIC*    CIPROFLOXACIN >=8 RESISTANT Resistant     ERYTHROMYCIN >=8 RESISTANT Resistant     GENTAMICIN <=0.5 SENSITIVE Sensitive     OXACILLIN >=4 RESISTANT Resistant     TETRACYCLINE <=1 SENSITIVE Sensitive     VANCOMYCIN 1 SENSITIVE Sensitive     TRIMETH/SULFA <=10 SENSITIVE Sensitive     CLINDAMYCIN RESISTANT Resistant     RIFAMPIN <=0.5 SENSITIVE Sensitive     Inducible Clindamycin POSITIVE Resistant     * ABUNDANT METHICILLIN RESISTANT STAPHYLOCOCCUS AUREUS  CULTURE, BLOOD (ROUTINE X 2) w Reflex to ID Panel     Status: None (Preliminary result)   Collection Time: 02/18/19 11:29 PM  Specimen: BLOOD  Result Value Ref Range Status   Specimen Description BLOOD LEFT ARM  Final   Special Requests   Final    BOTTLES DRAWN AEROBIC AND ANAEROBIC Blood Culture adequate volume   Culture   Final    NO GROWTH 4 DAYS Performed at [facility],  [address] [place], [place] [address]    Report Status PENDING  Incomplete  CULTURE, BLOOD (ROUTINE X 2) w Reflex to ID Panel     Status: None (Preliminary result)   Collection Time: 02/18/19 11:29 PM   Specimen: BLOOD  Result Value Ref Range Status   Specimen Description BLOOD LEFT ARM  Final   Special Requests   Final    BOTTLES DRAWN AEROBIC AND ANAEROBIC Blood Culture adequate volume   Culture   Final    NO GROWTH 4 DAYS Performed at [facility], [address] [place], [place] [address]    Report Status PENDING  Incomplete         Radiology Studies: No results found.      Scheduled Meds: . aspirin  81 mg Oral Daily  . chlorhexidine  15 mL Mouth Rinse BID  . Chlorhexidine Gluconate Cloth  6 each Topical Q0600  . enoxaparin (LOVENOX) injection  1 mg/kg Subcutaneous Q M,W,F-1800  . [START ON 02/23/2019] epoetin (EPOGEN/PROCRIT) injection  4,000 Units Intravenous Q M,W,F-HD  . feeding supplement (NEPRO CARB STEADY)  237 mL Oral TID BM  . fluticasone  2 spray Each Nare Daily  . insulin aspart  0-9 Units Subcutaneous TID WC  . levothyroxine  88 mcg Oral QAC breakfast  . mouth rinse  15 mL Mouth Rinse q12n4p  . midodrine  10 mg Oral TID WC  . multivitamin  1 tablet Oral QHS  . pantoprazole (PROTONIX) IV  40 mg Intravenous Q24H  . potassium chloride  40 mEq Oral Once  . venlafaxine  75 mg Oral BID WC  . zinc sulfate  220 mg Oral Daily   Continuous Infusions: . sodium chloride Stopped (02/20/19 0101)  . ceFTAROline (TEFLARO) IV 300 mg (02/22/19 0558)  . DAPTOmycin (CUBICIN)  IV Stopped (02/20/19 2147)     LOS: 13 days    Time spent: 70mins    Sidney Ace, MD [facility] Pager: [phone]  If 7PM-7AM, please contact night-coverage [email or web address]  02/22/2019, 1:13 PM

## 2019-02-23 ENCOUNTER — Encounter
Admission: EM | Disposition: A | Discharge status: Skilled Nursing Facility | Payer: Self-pay | Source: Home / Self Care | Attending: Internal Medicine

## 2019-02-23 DIAGNOSIS — T82898A Other specified complication of vascular prosthetic devices, implants and grafts, initial encounter: Secondary | ICD-10-CM

## 2019-02-23 DIAGNOSIS — N186 End stage renal disease: Secondary | ICD-10-CM

## 2019-02-23 DIAGNOSIS — A419 Sepsis, unspecified organism: Secondary | ICD-10-CM

## 2019-02-23 DIAGNOSIS — S91302A Unspecified open wound, left foot, initial encounter: Secondary | ICD-10-CM

## 2019-02-23 DIAGNOSIS — Z992 Dependence on renal dialysis: Secondary | ICD-10-CM

## 2019-02-23 DIAGNOSIS — R079 Chest pain, unspecified: Secondary | ICD-10-CM

## 2019-02-23 DIAGNOSIS — L0291 Cutaneous abscess, unspecified: Secondary | ICD-10-CM

## 2019-02-23 DIAGNOSIS — R609 Edema, unspecified: Secondary | ICD-10-CM

## 2019-02-23 DIAGNOSIS — E46 Unspecified protein-calorie malnutrition: Secondary | ICD-10-CM

## 2019-02-23 DIAGNOSIS — R4182 Altered mental status, unspecified: Secondary | ICD-10-CM

## 2019-02-23 HISTORY — PX: A/V FISTULAGRAM: CATH118298

## 2019-02-23 LAB — BASIC METABOLIC PANEL
Anion gap: 16 — ABNORMAL HIGH (ref 5–15)
BUN: 17 mg/dL (ref 8–23)
CO2: 21 mmol/L — ABNORMAL LOW (ref 22–32)
Calcium: 8 mg/dL — ABNORMAL LOW (ref 8.9–10.3)
Chloride: 106 mmol/L (ref 98–111)
Creatinine, Ser: 4.21 mg/dL — ABNORMAL HIGH (ref 0.44–1.00)
GFR calc Af Amer: 12 mL/min — ABNORMAL LOW (ref >60)
GFR calc non Af Amer: 10 mL/min — ABNORMAL LOW (ref >60)
Glucose, Bld: 197 mg/dL — ABNORMAL HIGH (ref 70–99)
Potassium: 2.9 mmol/L — ABNORMAL LOW (ref 3.5–5.1)
Sodium: 143 mmol/L (ref 135–145)

## 2019-02-23 LAB — GLUCOSE, CAPILLARY
Glucose-Capillary: 170 mg/dL — ABNORMAL HIGH (ref 70–99)
Glucose-Capillary: 88 mg/dL (ref 70–99)
Glucose-Capillary: 114 mg/dL — ABNORMAL HIGH (ref 70–99)
Glucose-Capillary: 95 mg/dL (ref 70–99)
Glucose-Capillary: 106 mg/dL — ABNORMAL HIGH (ref 70–99)
Glucose-Capillary: 148 mg/dL — ABNORMAL HIGH (ref 70–99)

## 2019-02-23 LAB — CULTURE, BLOOD (ROUTINE X 2)
Culture: NO GROWTH
Culture: NO GROWTH
Special Requests: ADEQUATE
Special Requests: ADEQUATE

## 2019-02-23 LAB — CK: Total CK: 29 U/L — ABNORMAL LOW (ref 38–234)

## 2019-02-23 LAB — POTASSIUM (ARMC VASCULAR LAB ONLY): Potassium (ARMC vascular lab): 3.4 — ABNORMAL LOW (ref 3.5–5.1)

## 2019-02-23 LAB — HEPARIN LEVEL (UNFRACTIONATED): Heparin Unfractionated: <0.1 IU/mL — ABNORMAL LOW (ref 0.30–0.70)

## 2019-02-23 LAB — PHOSPHORUS: Phosphorus: 3.7 mg/dL (ref 2.5–4.6)

## 2019-02-23 LAB — MAGNESIUM: Magnesium: 1.8 mg/dL (ref 1.7–2.4)

## 2019-02-23 SURGERY — A/V FISTULAGRAM
Anesthesia: Moderate Sedation | Laterality: Right

## 2019-02-23 MED ORDER — B COMPLEX-C PO TABS
1.0000 | ORAL_TABLET | Freq: Every day | ORAL | Status: DC
  Administered 2019-02-24 – 2019-04-19 (×48): 1
  Filled 2019-02-23 (×58): qty 1

## 2019-02-23 MED ORDER — ALBUMIN HUMAN 25 % IV SOLN
25.0000 g | Freq: Once | INTRAVENOUS | Status: AC
  Administered 2019-02-23: 25 g via INTRAVENOUS
  Filled 2019-02-23 (×2): qty 100

## 2019-02-23 MED ORDER — MAGNESIUM SULFATE IN D5W 1-5 GM/100ML-% IV SOLN
1.0000 g | Freq: Once | INTRAVENOUS | Status: AC
  Administered 2019-02-23: 1 g via INTRAVENOUS
  Filled 2019-02-23: qty 100

## 2019-02-23 MED ORDER — PRO-STAT SUGAR FREE PO LIQD
30.0000 mL | Freq: Every day | ORAL | Status: DC
  Administered 2019-02-25 – 2019-03-02 (×6): 30 mL

## 2019-02-23 MED ORDER — FENTANYL CITRATE (PF) 100 MCG/2ML IJ SOLN
INTRAMUSCULAR | Status: DC | PRN
  Administered 2019-02-23 (×2): 25 ug via INTRAVENOUS

## 2019-02-23 MED ORDER — MIDAZOLAM HCL 5 MG/5ML IJ SOLN
INTRAMUSCULAR | Status: AC
  Filled 2019-02-23: qty 5

## 2019-02-23 MED ORDER — SODIUM CHLORIDE 0.9 % IV SOLN: INTRAVENOUS | Status: DC

## 2019-02-23 MED ORDER — MIDAZOLAM HCL 2 MG/2ML IJ SOLN
INTRAMUSCULAR | Status: DC | PRN
  Administered 2019-02-23 (×2): 1 mg via INTRAVENOUS

## 2019-02-23 MED ORDER — HEPARIN SODIUM (PORCINE) 10000 UNIT/ML IJ SOLN
INTRAMUSCULAR | Status: AC
  Filled 2019-02-23: qty 1

## 2019-02-23 MED ORDER — OSMOLITE 1.5 CAL PO LIQD
1000.0000 mL | ORAL | Status: DC
  Administered 2019-02-25: 1000 mL

## 2019-02-23 MED ORDER — SODIUM CHLORIDE 0.9 % IV BOLUS
250.0000 mL | Freq: Once | INTRAVENOUS | Status: AC
  Administered 2019-02-23: 250 mL via INTRAVENOUS

## 2019-02-23 MED ORDER — FENTANYL CITRATE (PF) 100 MCG/2ML IJ SOLN
INTRAMUSCULAR | Status: AC
  Filled 2019-02-23: qty 2

## 2019-02-23 MED ORDER — HEPARIN SODIUM (PORCINE) 1000 UNIT/ML IJ SOLN
INTRAMUSCULAR | Status: AC
  Filled 2019-02-23: qty 1

## 2019-02-23 MED ORDER — FREE WATER
30.0000 mL | Status: DC
  Administered 2019-02-24 – 2019-03-06 (×54): 30 mL

## 2019-02-23 MED ORDER — POTASSIUM CHLORIDE 10 MEQ/100ML IV SOLN: 10.0000 meq | INTRAVENOUS | Status: AC

## 2019-02-23 MED ORDER — HEPARIN (PORCINE) 25000 UT/250ML-% IV SOLN
1100.0000 [IU]/h | INTRAVENOUS | Status: DC
  Administered 2019-02-23: 900 [IU]/h via INTRAVENOUS
  Administered 2019-02-26 (×3): 800 [IU]/h via INTRAVENOUS
  Administered 2019-02-28: 900 [IU]/h via INTRAVENOUS
  Administered 2019-03-01 – 2019-03-02 (×2): 1100 [IU]/h via INTRAVENOUS
  Filled 2019-02-23 (×6): qty 250

## 2019-02-23 SURGICAL SUPPLY — 11 items
CANNULA 5F STIFF (CANNULA) ×3 IMPLANT
CATH BEACON 5 .035 40 KMP TP (CATHETERS) ×1 IMPLANT
CATH BEACON 5 .038 40 KMP TP (CATHETERS) ×2
CATH PALINDROME RT-P 15FX19CM (CATHETERS) ×3 IMPLANT
DERMABOND ADVANCED (GAUZE/BANDAGES/DRESSINGS) ×2
DERMABOND ADVANCED .7 DNX12 (GAUZE/BANDAGES/DRESSINGS) ×1 IMPLANT
PACK ANGIOGRAPHY (CUSTOM PROCEDURE TRAY) ×3 IMPLANT
SHEATH BRITE TIP 6FRX5.5 (SHEATH) ×3 IMPLANT
SUT MNCRL AB 4-0 PS2 18 (SUTURE) ×6 IMPLANT
SUT PROLENE 0 CT 1 30 (SUTURE) ×3 IMPLANT
WIRE MAGIC TOR.035 180C (WIRE) ×3 IMPLANT

## 2019-02-23 NOTE — Consult Note (Addendum)
ANTICOAGULATION CONSULT NOTE  Pharmacy Consult for heparin Indication: DVT   Patient Measurements: Height: 5\' 4"  (162.6 cm) Weight: 161 lb 13.1 oz (73.4 kg) IBW/kg (Calculated) : 54.7 Heparin Dosing Weight: 71 kg  Vital Signs: Temp: 97.8 F (36.6 C) (12/21 1333) Temp Source: Axillary (12/21 1333) BP: 101/40 (12/21 1333) Pulse Rate: 65 (12/21 1333)  Labs: Recent Labs    02/21/19 1159 02/23/19 0539 02/23/19 1000  HGB 7.7*  --   --   HCT 24.3*  --   --   PLT 227  --   --   HEPARINUNFRC  --   --  <0.10*  CREATININE 2.88* 4.21*  --   CKTOTAL  --  29*  --     Estimated Creatinine Clearance: 13.1 mL/min (A) (by C-G formula based on SCr of 4.21 mg/dL (H)).   Medical History: Past Medical History:  Diagnosis Date  . Anemia   . Chronic diastolic CHF (congestive heart failure) ([place])    a. Due to ischemic cardiomyopathy. EF as low as 35%, improved to normal s/p CABG; b. echo 07/06/13: EF 55-60%, no RWMAs, mod TR, trivial pericardial effusion not c/w tamponade physiology;  c. 10/2015 Echo: EF 65%, Gr1 DD, triv AI, mild MR, mildly dil LA, mod TR, PASP 57mmHg.  Marland Kitchen Coronary artery disease    a. NSTEMI 06/2013; b.cath: severe three-vessel CAD w/ EF 30% & mild-mod MR; c. s/p 3 vessel CABG 07/02/13 (LIMA-LAD, SVG-OM, and SVG-RPDA);  d. 10/2015 MV: no ischemia/infarct.  . Diabetes mellitus without complication ([place])   . Diabetic neuropathy ([place])   . Dialysis patient [facility])    MWF  . ESRD (end stage renal disease) ([place])    a. 12/2015 initiated - mwf dialysis.  Marland Kitchen GERD (gastroesophageal reflux disease)   . Hyperlipidemia   . Hypertension   . Hypothyroidism   . Myocardial infarction ([place]) 2015  . Neuropathy   . Pleural effusion 2015  . Pulmonary hypertension ([place])   . Renal insufficiency   . Wears dentures    full lower    Medications:  Scheduled:  . [MAR Hold] aspirin  81 mg Oral Daily  . [MAR Hold] chlorhexidine  15 mL Mouth Rinse BID  . [MAR Hold] Chlorhexidine Gluconate Cloth   6 each Topical Q0600  . [MAR Hold] enoxaparin (LOVENOX) injection  1 mg/kg Subcutaneous Q M,W,F-1800  . [MAR Hold] epoetin (EPOGEN/PROCRIT) injection  4,000 Units Intravenous Q M,W,F-HD  . [MAR Hold] feeding supplement (NEPRO CARB STEADY)  237 mL Oral TID BM  . [MAR Hold] fluticasone  2 spray Each Nare Daily  . [MAR Hold] insulin aspart  0-9 Units Subcutaneous TID WC  . [MAR Hold] levothyroxine  88 mcg Oral QAC breakfast  . [MAR Hold] mouth rinse  15 mL Mouth Rinse q12n4p  . [MAR Hold] midodrine  10 mg Oral TID WC  . [MAR Hold] multivitamin  1 tablet Oral QHS  . [MAR Hold] pantoprazole (PROTONIX) IV  40 mg Intravenous Q24H  . [MAR Hold] venlafaxine  75 mg Oral BID WC  . [MAR Hold] zinc sulfate  220 mg Oral Daily    Assessment: 63 y.o. female admitted on 02/09/2019 with MRSA  bacteremia. She was on apixaban PTA but last dose was prior to admission (12/7). [place] 12/15 revealed age-indeterminate short segment occlusive DVT involving the mid humeral aspect of one of the paired brachial veins.  Heparin was started but after repeated attempts by multiple phlebotomists lab was unable to draw labs required for heparin monitoring.  Additionally, she was too confused and disoriented to swallow oral medications. On 12/20 a femoral central line was placed. She is having her AV fistula replaced today. She received a single dose of enoxaparin 12/18 and a heparin level this morning showed an untraceable level.  Goal of Therapy:  Monitor platelets by anticoagulation protocol: Yes   Plan:   D/C enoxaparin  Based on data from this visit heparin will be re-started at 900 units/hr  Bolus dose is being withheld due to today's AV fistula placement   We will obtain an anti-Xa level 8 hours after heparin infusion starts  CBC in am   Dallie Piles, PharmD, BCPS Clinical Pharmacist 02/23/2019 2:06 PM

## 2019-02-23 NOTE — Op Note (Signed)
OPERATIVE NOTE    PRE-OPERATIVE DIAGNOSIS: 1. ESRD 2.  Open wound overlying her right arm AV graft requiring [place] to stop using this  POST-OPERATIVE DIAGNOSIS: same as above  PROCEDURE: 1. Ultrasound guidance for vascular access to the left internal jugular vein 2. Fluoroscopic guidance for placement of catheter 3. Placement of a 19 cm tip to cuff tunneled hemodialysis catheter via the left internal jugular vein  SURGEON: Leotis Pain, MD  ANESTHESIA:  Local with Moderate conscious sedation for approximately 15 minutes using 1 mg of Versed and 25 mcg of Fentanyl  ESTIMATED BLOOD LOSS: 5 cc  FLUORO TIME: less than one minute  CONTRAST: none  FINDING(S): 1.  Patent left internal jugular vein, occluded right internal jugular vein  SPECIMEN(S):  None  INDICATIONS:   Jamie Arias is a 63 y.o. female who presents with a wound overlying her right arm AV graft.  Her mental status is poor and she is doing very poorly clinically.  The patient needs long term dialysis access for their ESRD, and a Permcath is necessary as her graft would not be usable until this wound heals due to the risk of infection and bleeding.  Risks and benefits are discussed and informed consent is obtained.    DESCRIPTION: After obtaining full informed written consent, the patient was brought back to the vascular suited. The patient's left neck and chest were sterilely prepped and draped in a sterile surgical field was created. Moderate conscious sedation was administered during a face to face encounter with the patient throughout the procedure with my supervision of the RN administering medicines and monitoring the patient's vital signs, pulse oximetry, telemetry and mental status throughout from the start of the procedure until the patient was taken to the recovery room.   I initially started on the right side, but the right internal jugular vein was occluded and no large external jugular vein or other collateral bolus  found that would be adequate for PermCath placement.  I then turned my attention to the left.  The left neck and chest were sterilely prepped and draped.  The left internal jugular vein was visualized with ultrasound and found to be patent. It was then accessed under direct ultrasound guidance and a permanent image was recorded. A wire was placed. After skin nick and dilatation, the peel-away sheath was placed over the wire. I then turned my attention to an area under the clavicle. Approximately 1-2 fingerbreadths below the clavicle a small counterincision was created and tunneled from the subclavicular incision to the access site. Using fluoroscopic guidance, a 19 centimeter tip to cuff tunneled hemodialysis catheter was selected, and tunneled from the subclavicular incision to the access site. It was then placed through the peel-away sheath and the peel-away sheath was removed. Using fluoroscopic guidance the catheter tips were parked in the right atrium. The appropriate distal connectors were placed. It withdrew blood well and flushed easily with heparinized saline and a concentrated heparin solution was then placed. It was secured to the chest wall with 2 Prolene sutures. The access incision was closed single 4-0 Monocryl. A 4-0 Monocryl pursestring suture was placed around the exit site. Sterile dressings were placed. The patient tolerated the procedure well and was taken to the recovery room in stable condition.  COMPLICATIONS: None  CONDITION: Stable  Leotis Pain  02/23/2019, 3:41 PM   This note was created with Dragon Medical transcription system. Any errors in dictation are purely unintentional.

## 2019-02-23 NOTE — Progress Notes (Signed)
Attempted twice to insert a dobbhoff tube and it was unsuccessful.  Dr. Delaine [name] said the ICU had attempted this in ICU and they were unsuccessful. Night RN to notify MD and then possibly place in radiology tomorrow.

## 2019-02-23 NOTE — Progress Notes (Signed)
This note also relates to the following rows which could not be included: Pulse Rate - Cannot attach notes to unvalidated device data Resp - Cannot attach notes to unvalidated device data  Hd started  

## 2019-02-23 NOTE — Progress Notes (Signed)
Bleeding at venous aspect of Right AVF.  Dr Juleen China visualized prior to start of dialysis treatment.

## 2019-02-23 NOTE — Progress Notes (Signed)
OT Cancellation Note  Patient Details Name: Jamie Arias MRN: [id number] DOB: Jan 03, 1956   Cancelled Treatment:    Reason Eval/Treat Not Completed: Patient at procedure or test/ unavailable. Pt out of room for dialysis. Will re-attempt at later date/time as medically appropriate.   [name], MPH, MS, OTR/L ascom [phone] 02/23/19, 1:55 PM

## 2019-02-23 NOTE — Progress Notes (Signed)
Daily Progress Note   Patient Name: Jamie Arias       Date: 02/23/2019 DOB: 1955-03-08  Age: 63 y.o. MRN#: [id number] Attending Physician: Sidney Ace, MD Primary Care Physician: [name], MD Admit Date: 02/09/2019  Reason for Consultation/Follow-up: Establishing goals of care  Subjective: Patient continues to be lethargic. Spoke with husband via phone. He states she opens her eyes and recognizes him. He states overall she continues to be about the same. He states he spoke with her children and they wanted a DNR/DNI status, an he agreed with them. He states a doctor discussed a temporary feeding tube with him which he is in agreement with. He is unsure if he would want a permanent feeding tube, even if she does not show improvement with a temporary feeding tube. He states he will need to speak with her children. Discussed QOL. He is unable to speak to an acceptable QOL.    Length of Stay: 14  Current Medications: Scheduled Meds:  . aspirin  81 mg Oral Daily  . chlorhexidine  15 mL Mouth Rinse BID  . Chlorhexidine Gluconate Cloth  6 each Topical Q0600  . enoxaparin (LOVENOX) injection  1 mg/kg Subcutaneous Q M,W,F-1800  . epoetin (EPOGEN/PROCRIT) injection  4,000 Units Intravenous Q M,W,F-HD  . feeding supplement (NEPRO CARB STEADY)  237 mL Oral TID BM  . fluticasone  2 spray Each Nare Daily  . insulin aspart  0-9 Units Subcutaneous TID WC  . levothyroxine  88 mcg Oral QAC breakfast  . mouth rinse  15 mL Mouth Rinse q12n4p  . midodrine  10 mg Oral TID WC  . multivitamin  1 tablet Oral QHS  . pantoprazole (PROTONIX) IV  40 mg Intravenous Q24H  . venlafaxine  75 mg Oral BID WC  . zinc sulfate  220 mg Oral Daily    Continuous Infusions: . sodium chloride Stopped  (02/20/19 0101)  . ceFTAROline (TEFLARO) IV 250 mL/hr at 02/23/19 [id number]  . DAPTOmycin (CUBICIN)  IV Stopped (02/22/19 2235)  . magnesium sulfate bolus IVPB    . potassium chloride      PRN Meds: sodium chloride, haloperidol lactate, ondansetron (ZOFRAN) IV  Physical Exam Constitutional:      Comments: Resting with eyes closed.   Pulmonary:     Effort: Pulmonary effort is  normal.             Vital Signs: BP (!) 100/52   Pulse 61   Temp 98.5 F (36.9 C) (Axillary)   Resp (!) 21   Ht 5\' 4"  (1.626 m)   Wt 73.4 kg   SpO2 99%   BMI 27.78 kg/m  SpO2: SpO2: 99 % O2 Device: O2 Device: Room Air O2 Flow Rate: O2 Flow Rate (L/min): 2 L/min  Intake/output summary:   Intake/Output Summary (Last 24 hours) at 02/23/2019 1218 Last data filed at 02/23/2019 [id number] Gross per 24 hour  Intake 471.01 ml  Output --  Net 471.01 ml   LBM: Last BM Date: 02/20/19 Baseline Weight: Weight: 59 kg Most recent weight: Weight: 73.4 kg       Palliative Assessment/Data: 20%      Patient Active Problem List   Diagnosis Date Noted  . Arm DVT (deep venous thromboembolism), acute, left ([place]) 02/13/2019  . Acute encephalopathy 02/13/2019  . Pressure injury of skin 02/10/2019  . Sepsis due to methicillin resistant Staphylococcus aureus (MRSA) ([place]) 02/10/2019  . MRSA bacteremia 02/10/2019  . Altered level of consciousness 02/09/2019  . Adjustment disorder with depressed mood 08/31/2018  . COVID-19 virus infection 08/24/2018  . Acute on chronic respiratory failure with hypoxia ([place]) 08/24/2018  . UTI (urinary tract infection) 08/24/2018  . Allergic rhinitis 09/19/2017  . Uremia of renal origin 09/04/2017  . Chronic neck pain 07/25/2017  . Hyperkalemia 02/15/2017  . Complication from renal dialysis device 12/01/2016  . ESRD (end stage renal disease) ([place]) 01/19/2016  . GI bleed 10/25/2015  . Hypertensive heart disease 10/24/2015  . Chronic diastolic CHF (congestive heart failure) ([place])  10/24/2015  . Unstable angina ([place]) 10/23/2015  . Hypotension 09/02/2015  . Chronic systolic CHF (congestive heart failure) ([place]) 09/02/2015  . Depression 07/27/2015  . Calculus of gallbladder with chronic cholecystitis without obstruction   . Bilateral carotid bruits 11/30/2014  . Low magnesium levels 08/10/2013  . Anemia 08/10/2013  . Constipation 08/10/2013  . Postoperative atrial fibrillation ([place]) 07/30/2013  . Coronary artery disease   . CAD (coronary artery disease) 07/02/2013  . Acute systolic heart failure ([place]) 06/30/2013  . NSTEMI (non-ST elevated myocardial infarction) ([place]) 06/29/2013  . Vertigo 08/25/2012  . Sleep disorder 05/23/2012  . Hypothyroid 04/27/2012  . HTN (hypertension) 04/27/2012  . HLD (hyperlipidemia) 04/27/2012  . Diabetes mellitus, type 2 ([place]) 04/27/2012  . Neuropathy 04/27/2012    Palliative Care Assessment & Plan    Recommendations/Plan:  Husband states he would like a temporary feeding tube as discussed with primary team. He states he will discuss with her children where to go from there depending on the outcome.     Code Status:    Code Status Orders  (From admission, onward)         Start     Ordered   02/21/19 0930  Do not attempt resuscitation (DNR)  Continuous    Question Answer Comment  In the event of cardiac or respiratory ARREST Do not call a "code blue"   In the event of cardiac or respiratory ARREST Do not perform Intubation, CPR, defibrillation or ACLS   In the event of cardiac or respiratory ARREST Use medication by any route, position, wound care, and other measures to relive pain and suffering. May use oxygen, suction and manual treatment of airway obstruction as needed for comfort.      02/21/19 0932        Code Status History  Date Active Date Inactive Code Status Order ID Comments User Context   02/09/2019 1755 02/21/2019 0932 Full Code [id number]  Thornell Mule, MD ED   08/24/2018 0022 09/15/2018 1720 Full Code  [id number]  Shela Leff, MD Inpatient   09/04/2017 1510 09/06/2017 1920 Full Code [id number]  Epifanio Lesches, MD ED   02/15/2017 1243 02/16/2017 2023 Full Code [id number]  Saundra Shelling, MD Inpatient   10/23/2015 0828 10/23/2015 0908 Full Code [id number]  Saundra Shelling, MD Inpatient   09/03/2015 0130 09/03/2015 1619 Full Code [id number]  Lance Coon, MD Inpatient   07/02/2013 1327 07/14/2013 1540 Full Code [id number]  Nani Skillern, PA-C Inpatient   06/29/2013 2153 07/02/2013 1327 Full Code [id number]  Larey Dresser, MD Inpatient   Advance Care Planning Activity       Prognosis:  Poor overall.     Care plan was discussed with RN  Thank you for allowing the Palliative Medicine Team to assist in the care of this patient.   Total Time 35 min Prolonged Time Billed  no      Greater than 50%  of this time was spent counseling and coordinating care related to the above assessment and plan.  Asencion Gowda, NP  Please contact Palliative Medicine Team phone at [phone] for questions and concerns.

## 2019-02-23 NOTE — Progress Notes (Signed)
[facility]  ROUNDING NOTE   Subjective:   Seen and examined on hemodialysis treatment. Patient is obtunded and not arousable.   Bleeding above AVG.     HEMODIALYSIS FLOWSHEET:  Blood Flow Rate (mL/min): 300 mL/min Arterial Pressure (mmHg): -160 mmHg Venous Pressure (mmHg): 140 mmHg Transmembrane Pressure (mmHg): 60 mmHg Ultrafiltration Rate (mL/min): 170 mL/min Dialysate Flow Rate (mL/min): 600 ml/min Conductivity: Machine : 14.2 Conductivity: Machine : 14.2 Dialysis Fluid Bolus: Normal Saline Bolus Amount (mL): 250 mL Dialysate Change: 4K(Verbal order change by MD for low K+)    Objective:  Vital signs in last 24 hours:  Temp:  [97.6 F (36.4 C)-99.1 F (37.3 C)] 97.8 F (36.6 C) (12/21 1333) Pulse Rate:  [61-75] 75 (12/21 1510) Resp:  [10-26] 15 (12/21 1510) BP: (95-128)/(33-57) 117/42 (12/21 1510) SpO2:  [99 %-100 %] 100 % (12/21 1510) Weight:  [73.4 kg] 73.4 kg (12/21 1333)  Weight change:  Filed Weights   02/20/19 1155 02/20/19 1545 02/23/19 1333  Weight: 74.9 kg 73.4 kg 73.4 kg    Intake/Output: I/O last 3 completed shifts: In: 971 [IV Piggyback:971] Out: 0    Intake/Output this shift:  No intake/output data recorded.  Physical Exam: General: NAD,   Head: Normocephalic, atraumatic. Moist oral mucosal membranes  Eyes: Anicteric, PERRL  Neck: Supple, trachea midline  Lungs:  Clear to auscultation  Heart: Regular rate and rhythm  Abdomen:  Soft, nontender,   Extremities: + peripheral edema.  Neurologic: Lethargic, not following commands  Skin: No lesions  Access: Left AVG, bleeding over access    Basic Metabolic Panel: Recent Labs  Lab 02/17/19 0533 02/18/19 0215 02/18/19 1200 02/19/19 2119 02/21/19 1159 02/22/19 0531 02/23/19 0539  NA 139 137  --  142 141  --  143  K 4.0 4.0  --  3.0* 3.2* 3.1* 2.9*  CL 102 100  --  100 101  --  106  CO2 25 23  --  28 21*  --  21*  GLUCOSE 101* 128*  --  119* 184*  --  197*  BUN 14 18   --  11 10  --  17  CREATININE 3.27* 3.95*  --  3.29* 2.88*  --  4.21*  CALCIUM 8.1* 8.0*  --  8.4* 8.5*  --  8.0*  MG  --  1.7  --  1.9 1.7  --  1.8  PHOS 1.8* 2.7 3.0 2.9  --   --  3.7    Liver Function Tests: Recent Labs  Lab 02/17/19 0533 02/19/19 2119  ALBUMIN 2.0* 2.2*   No results for input(s): LIPASE, AMYLASE in the last 168 hours. Recent Labs  Lab 02/17/19 0533 02/18/19 1334 02/19/19 2119  AMMONIA 16 10 12     CBC: Recent Labs  Lab 02/18/19 0215 02/19/19 2119 02/21/19 1159  WBC 10.6* 15.9* 9.6  HGB 9.4* 8.7* 7.7*  HCT 28.4* 25.6* 24.3*  MCV 84.3 83.1 88.4  PLT 177 218 227    Cardiac Enzymes: Recent Labs  Lab 02/23/19 0539  CKTOTAL 29*    BNP: Invalid input(s): POCBNP  CBG: Recent Labs  Lab 02/22/19 1749 02/22/19 2145 02/23/19 0752 02/23/19 1247 02/23/19 1333  GLUCAP 121* 122* 170* 59 114*    Microbiology: Results for orders placed or performed during the hospital encounter of 02/09/19  Culture, blood (routine x 2)     Status: Abnormal   Collection Time: 02/09/19  2:29 PM   Specimen: BLOOD LEFT ARM  Result Value Ref Range Status  Specimen Description   Final    BLOOD LEFT ARM Performed at [facility], [address], [place], [place] [address]    Special Requests   Final    BOTTLES DRAWN AEROBIC AND ANAEROBIC Blood Culture adequate volume Performed at [facility], [address] [place], [place] [address]    Culture  Setup Time   Final    GRAM POSITIVE COCCI IN BOTH AEROBIC AND ANAEROBIC BOTTLES CRITICAL VALUE NOTED.  VALUE IS CONSISTENT WITH PREVIOUSLY REPORTED AND CALLED VALUE. Performed at [facility], [address] [place], [place] [address]    Culture (A)  Final    STAPHYLOCOCCUS AUREUS SUSCEPTIBILITIES PERFORMED ON PREVIOUS CULTURE WITHIN THE LAST 5 DAYS. Performed at [facility], [place] [address], [place], [place] [address]    Report Status 02/12/2019 FINAL  Final  Culture, blood  (routine x 2)     Status: Abnormal   Collection Time: 02/09/19  2:38 PM   Specimen: BLOOD LEFT ARM  Result Value Ref Range Status   Specimen Description   Final    BLOOD LEFT ARM Performed at [facility], [address], [place], [place] [address]    Special Requests   Final    BOTTLES DRAWN AEROBIC AND ANAEROBIC Blood Culture adequate volume Performed at [facility] [address], [place], [place] [address]    Culture  Setup Time   Final    GRAM POSITIVE COCCI IN BOTH AEROBIC AND ANAEROBIC BOTTLES CRITICAL RESULT CALLED TO, READ BACK BY AND VERIFIED WITH: [place] [id number] 02/10/2019 HNM Performed at [facility], [place] [address], [place], [place] [address]    Culture METHICILLIN RESISTANT STAPHYLOCOCCUS AUREUS (A)  Final   Report Status 02/12/2019 FINAL  Final   Organism ID, Bacteria METHICILLIN RESISTANT STAPHYLOCOCCUS AUREUS  Final      Susceptibility   Methicillin resistant staphylococcus aureus - MIC*    CIPROFLOXACIN >=8 RESISTANT Resistant     ERYTHROMYCIN >=8 RESISTANT Resistant     GENTAMICIN <=0.5 SENSITIVE Sensitive     OXACILLIN >=4 RESISTANT Resistant     TETRACYCLINE <=1 SENSITIVE Sensitive     VANCOMYCIN 1 SENSITIVE Sensitive     TRIMETH/SULFA <=10 SENSITIVE Sensitive     CLINDAMYCIN RESISTANT Resistant     RIFAMPIN <=0.5 SENSITIVE Sensitive     Inducible Clindamycin POSITIVE Resistant     * METHICILLIN RESISTANT STAPHYLOCOCCUS AUREUS  Blood Culture ID Panel (Reflexed)     Status: Abnormal   Collection Time: 02/09/19  2:38 PM  Result Value Ref Range Status   Enterococcus species NOT DETECTED NOT DETECTED Final   Listeria monocytogenes NOT DETECTED NOT DETECTED Final   Staphylococcus species DETECTED (A) NOT DETECTED Final    Comment: CRITICAL RESULT CALLED TO, READ BACK BY AND VERIFIED WITH: Hart Robinsons PHARMD [id number] 02/10/2019 HNM    Staphylococcus aureus (BCID) DETECTED (A) NOT DETECTED Final    Comment: Methicillin  (oxacillin)-resistant Staphylococcus aureus (MRSA). MRSA is predictably resistant to beta-lactam antibiotics (except ceftaroline). Preferred therapy is vancomycin unless clinically contraindicated. Patient requires contact precautions if  hospitalized. CRITICAL RESULT CALLED TO, READ BACK BY AND VERIFIED WITH: Hart Robinsons PHARMD [id number] 02/10/2019 HNM    Methicillin resistance DETECTED (A) NOT DETECTED Final    Comment: CRITICAL RESULT CALLED TO, READ BACK BY AND VERIFIED WITH: Hart Robinsons PHARMD [id number] 02/10/2019 HNM    Streptococcus species NOT DETECTED NOT DETECTED Final   Streptococcus agalactiae NOT DETECTED NOT DETECTED Final  Streptococcus pneumoniae NOT DETECTED NOT DETECTED Final   Streptococcus pyogenes NOT DETECTED NOT DETECTED Final   Acinetobacter baumannii NOT DETECTED NOT DETECTED Final   Enterobacteriaceae species NOT DETECTED NOT DETECTED Final   Enterobacter cloacae complex NOT DETECTED NOT DETECTED Final   Escherichia coli NOT DETECTED NOT DETECTED Final   Klebsiella oxytoca NOT DETECTED NOT DETECTED Final   Klebsiella pneumoniae NOT DETECTED NOT DETECTED Final   Proteus species NOT DETECTED NOT DETECTED Final   Serratia marcescens NOT DETECTED NOT DETECTED Final   Haemophilus influenzae NOT DETECTED NOT DETECTED Final   Neisseria meningitidis NOT DETECTED NOT DETECTED Final   Pseudomonas aeruginosa NOT DETECTED NOT DETECTED Final   Candida albicans NOT DETECTED NOT DETECTED Final   Candida glabrata NOT DETECTED NOT DETECTED Final   Candida krusei NOT DETECTED NOT DETECTED Final   Candida parapsilosis NOT DETECTED NOT DETECTED Final   Candida tropicalis NOT DETECTED NOT DETECTED Final    Comment: Performed at [facility], [place], [place] [address]  SARS CORONAVIRUS 2 (TAT 6-24 HRS) Nasopharyngeal Nasopharyngeal Swab     Status: None   Collection Time: 02/09/19  2:54 PM   Specimen: Nasopharyngeal Swab  Result Value Ref Range Status   SARS  Coronavirus 2 NEGATIVE NEGATIVE Final    Comment: (NOTE) SARS-CoV-2 target nucleic acids are NOT DETECTED. The SARS-CoV-2 RNA is generally detectable in upper and lower respiratory specimens during the acute phase of infection. Negative results do not preclude SARS-CoV-2 infection, do not rule out co-infections with other pathogens, and should not be used as the sole basis for treatment or other patient management decisions. Negative results must be combined with clinical observations, patient history, and epidemiological information. The expected result is Negative. Fact Sheet for Patients: SugarRoll.be Fact Sheet for Healthcare Providers: https://www.woods-mathews.com/ This test is not yet approved or cleared by the [place] FDA and  has been authorized for detection and/or diagnosis of SARS-CoV-2 by FDA under an Emergency Use Authorization (EUA). This EUA will remain  in effect (meaning this test can be used) for the duration of the COVID-19 declaration under Section 56 4(b)(1) of the Act, 21 U.S.C. section 360bbb-3(b)(1), unless the authorization is terminated or revoked sooner. Performed at [facility], [place] [address] [place], [place] [address]   MRSA PCR Screening     Status: Abnormal   Collection Time: 02/10/19  5:03 PM   Specimen: Nasopharyngeal  Result Value Ref Range Status   MRSA by PCR POSITIVE (A) NEGATIVE Final    Comment:        The GeneXpert MRSA Assay (FDA approved for NASAL specimens only), is one component of a comprehensive MRSA colonization surveillance program. It is not intended to diagnose MRSA infection nor to guide or monitor treatment for MRSA infections. RESULT CALLED TO, READ BACK BY AND VERIFIED WITH: ALEKSEY FRASER @1839  02/10/19 MJU Performed at [facility], [address] [place], [place] [address]   Culture, blood (routine x 2)     Status: Abnormal   Collection Time:  02/11/19 12:31 PM   Specimen: BLOOD  Result Value Ref Range Status   Specimen Description   Final    BLOOD PORTA CATH Performed at [place] [address] [place], [place] [address]    Special Requests   Final    BOTTLES DRAWN AEROBIC AND ANAEROBIC Blood Culture adequate volume Performed at [facility], [address], [place], [place] [address]    Culture  Setup Time   Final    GRAM POSITIVE COCCI ANAEROBIC BOTTLE ONLY CRITICAL RESULT CALLED TO, READ BACK BY AND VERIFIED WITH: SCOTT HALL AT [id number] 02/12/2019 [facility]  Performed at [facility], [address] [place], [place] [address]    Culture (A)  Final    STAPHYLOCOCCUS AUREUS SUSCEPTIBILITIES PERFORMED ON PREVIOUS CULTURE WITHIN THE LAST 5 DAYS. Performed at [facility], [place] [address], [place], [place] [address]    Report Status 02/13/2019 FINAL  Final  Culture, blood (routine x 2)     Status: None   Collection Time: 02/11/19  3:57 PM   Specimen: BLOOD  Result Value Ref Range Status   Specimen Description BLOOD PORTA CATH  Final   Special Requests   Final    BOTTLES DRAWN AEROBIC AND ANAEROBIC Blood Culture adequate volume   Culture   Final    NO GROWTH 5 DAYS Performed at [facility], [address] [place], [place] [address]    Report Status 02/16/2019 FINAL  Final  CULTURE, BLOOD (ROUTINE X 2) w Reflex to ID Panel     Status: Abnormal   Collection Time: 02/13/19  2:18 PM   Specimen: BLOOD LEFT HAND  Result Value Ref Range Status   Specimen Description BLOOD LEFT HAND  Final   Special Requests   Final    BOTTLES DRAWN AEROBIC AND ANAEROBIC Blood Culture results may not be optimal due to an inadequate volume of blood received in culture bottles   Culture  Setup Time   Final    AEROBIC BOTTLE ONLY GRAM POSITIVE COCCI IN CLUSTERS CRITICAL RESULT CALLED TO, READ BACK BY AND VERIFIED WITH: Herbert Pun AT [id number] ON 02/14/2019 [place]. Performed at [facility],  [place] [address], [place], [place] [address]    Culture METHICILLIN RESISTANT STAPHYLOCOCCUS AUREUS (A)  Final   Report Status 02/16/2019 FINAL  Final   Organism ID, Bacteria METHICILLIN RESISTANT STAPHYLOCOCCUS AUREUS  Final      Susceptibility   Methicillin resistant staphylococcus aureus - MIC*    CIPROFLOXACIN >=8 RESISTANT Resistant     ERYTHROMYCIN >=8 RESISTANT Resistant     GENTAMICIN <=0.5 SENSITIVE Sensitive     OXACILLIN >=4 RESISTANT Resistant     TETRACYCLINE <=1 SENSITIVE Sensitive     VANCOMYCIN <=0.5 SENSITIVE Sensitive     TRIMETH/SULFA <=10 SENSITIVE Sensitive     CLINDAMYCIN RESISTANT Resistant     RIFAMPIN <=0.5 SENSITIVE Sensitive     Inducible Clindamycin POSITIVE Resistant     * METHICILLIN RESISTANT STAPHYLOCOCCUS AUREUS  Blood Culture ID Panel (Reflexed)     Status: Abnormal   Collection Time: 02/13/19  2:18 PM  Result Value Ref Range Status   Enterococcus species NOT DETECTED NOT DETECTED Final   Listeria monocytogenes NOT DETECTED NOT DETECTED Final   Staphylococcus species DETECTED (A) NOT DETECTED Final    Comment: CRITICAL RESULT CALLED TO, READ BACK BY AND VERIFIED WITH: ABBEY ELLINGTON AT [id number] ON 02/14/2019 [place].    Staphylococcus aureus (BCID) DETECTED (A) NOT DETECTED Final    Comment: Methicillin (oxacillin)-resistant Staphylococcus aureus (MRSA). MRSA is predictably resistant to beta-lactam antibiotics (except ceftaroline). Preferred therapy is vancomycin unless clinically contraindicated. Patient requires contact precautions if  hospitalized. CRITICAL RESULT CALLED TO, READ BACK BY AND VERIFIED WITH: Herbert Pun AT [id number] ON 02/14/2019 [place].    Methicillin resistance DETECTED (A) NOT DETECTED Final    Comment: CRITICAL RESULT CALLED TO, READ BACK BY AND VERIFIED WITH: Herbert Pun  AT [id number] ON 02/14/2019 [place].    Streptococcus species NOT DETECTED NOT DETECTED Final   Streptococcus agalactiae NOT DETECTED NOT DETECTED Final   Streptococcus  pneumoniae NOT DETECTED NOT DETECTED Final   Streptococcus pyogenes NOT DETECTED NOT DETECTED Final   Acinetobacter baumannii NOT DETECTED NOT DETECTED Final   Enterobacteriaceae species NOT DETECTED NOT DETECTED Final   Enterobacter cloacae complex NOT DETECTED NOT DETECTED Final   Escherichia coli NOT DETECTED NOT DETECTED Final   Klebsiella oxytoca NOT DETECTED NOT DETECTED Final   Klebsiella pneumoniae NOT DETECTED NOT DETECTED Final   Proteus species NOT DETECTED NOT DETECTED Final   Serratia marcescens NOT DETECTED NOT DETECTED Final   Haemophilus influenzae NOT DETECTED NOT DETECTED Final   Neisseria meningitidis NOT DETECTED NOT DETECTED Final   Pseudomonas aeruginosa NOT DETECTED NOT DETECTED Final   Candida albicans NOT DETECTED NOT DETECTED Final   Candida glabrata NOT DETECTED NOT DETECTED Final   Candida krusei NOT DETECTED NOT DETECTED Final   Candida parapsilosis NOT DETECTED NOT DETECTED Final   Candida tropicalis NOT DETECTED NOT DETECTED Final    Comment: Performed at [facility], [address] [place], [place] [address]  CULTURE, BLOOD (ROUTINE X 2) w Reflex to ID Panel     Status: None   Collection Time: 02/13/19  3:09 PM   Specimen: BLOOD  Result Value Ref Range Status   Specimen Description BLOOD BLOOD LEFT HAND  Final   Special Requests   Final    BOTTLES DRAWN AEROBIC AND ANAEROBIC Blood Culture adequate volume   Culture   Final    NO GROWTH 5 DAYS Performed at [facility], [address], [place], [place] [address]    Report Status 02/18/2019 FINAL  Final  Culture, blood (routine x 2)     Status: Abnormal   Collection Time: 02/15/19 12:49 PM   Specimen: BLOOD  Result Value Ref Range Status   Specimen Description   Final    BLOOD LT HAND Performed at [facility], [address] [place], [place] [address]    Special Requests   Final    BOTTLES DRAWN AEROBIC AND ANAEROBIC Blood Culture results may not be optimal due to  an inadequate volume of blood received in culture bottles Performed at [facility], [address] [place], [place] [address]    Culture  Setup Time   Final    GRAM POSITIVE COCCI IN BOTH AEROBIC AND ANAEROBIC BOTTLES CRITICAL RESULT CALLED TO, READ BACK BY AND VERIFIED WITHEleonore Chiquito AT [id number] 02/16/2019 SDR Performed at [facility], [address] [place], [place] [address]    Culture (A)  Final    METHICILLIN RESISTANT STAPHYLOCOCCUS AUREUS STAPHYLOCOCCUS SPECIES (COAGULASE NEGATIVE) THE SIGNIFICANCE OF ISOLATING THIS ORGANISM FROM A SINGLE SET OF BLOOD CULTURES WHEN MULTIPLE SETS ARE DRAWN IS UNCERTAIN. PLEASE NOTIFY THE MICROBIOLOGY DEPARTMENT WITHIN ONE WEEK IF SPECIATION AND SENSITIVITIES ARE REQUIRED. Performed at [facility], [place] [address] [place], [place] [address]    Report Status 02/18/2019 FINAL  Final   Organism ID, Bacteria METHICILLIN RESISTANT STAPHYLOCOCCUS AUREUS  Final      Susceptibility   Methicillin resistant staphylococcus aureus - MIC*    CIPROFLOXACIN >=8 RESISTANT Resistant     ERYTHROMYCIN >=8 RESISTANT Resistant     GENTAMICIN <=0.5 SENSITIVE Sensitive     OXACILLIN >=4 RESISTANT Resistant     TETRACYCLINE <=1 SENSITIVE Sensitive     VANCOMYCIN <=0.5 SENSITIVE Sensitive  TRIMETH/SULFA <=10 SENSITIVE Sensitive     CLINDAMYCIN RESISTANT Resistant     RIFAMPIN <=0.5 SENSITIVE Sensitive     Inducible Clindamycin POSITIVE Resistant     * METHICILLIN RESISTANT STAPHYLOCOCCUS AUREUS  Aerobic Culture (superficial specimen)     Status: None   Collection Time: 02/17/19  1:31 PM   Specimen: Heel; Wound  Result Value Ref Range Status   Specimen Description   Final    HEEL Performed at [facility], [address] [place], [place] [address]    Special Requests   Final    NONE Performed at [facility], [address], [place], [place] [address]    Gram Stain   Final    NO WBC SEEN MODERATE GRAM  POSITIVE COCCI IN PAIRS    Culture   Final    ABUNDANT METHICILLIN RESISTANT STAPHYLOCOCCUS AUREUS ABUNDANT DIPHTHEROIDS(CORYNEBACTERIUM SPECIES) Standardized susceptibility testing for this organism is not available. Performed at [facility], [place] [address], [place], [place] [address]    Report Status 02/19/2019 FINAL  Final   Organism ID, Bacteria METHICILLIN RESISTANT STAPHYLOCOCCUS AUREUS  Final      Susceptibility   Methicillin resistant staphylococcus aureus - MIC*    CIPROFLOXACIN >=8 RESISTANT Resistant     ERYTHROMYCIN >=8 RESISTANT Resistant     GENTAMICIN <=0.5 SENSITIVE Sensitive     OXACILLIN >=4 RESISTANT Resistant     TETRACYCLINE <=1 SENSITIVE Sensitive     VANCOMYCIN 1 SENSITIVE Sensitive     TRIMETH/SULFA <=10 SENSITIVE Sensitive     CLINDAMYCIN RESISTANT Resistant     RIFAMPIN <=0.5 SENSITIVE Sensitive     Inducible Clindamycin POSITIVE Resistant     * ABUNDANT METHICILLIN RESISTANT STAPHYLOCOCCUS AUREUS  CULTURE, BLOOD (ROUTINE X 2) w Reflex to ID Panel     Status: None   Collection Time: 02/18/19 11:29 PM   Specimen: BLOOD  Result Value Ref Range Status   Specimen Description BLOOD LEFT ARM  Final   Special Requests   Final    BOTTLES DRAWN AEROBIC AND ANAEROBIC Blood Culture adequate volume   Culture   Final    NO GROWTH 5 DAYS Performed at [facility], [address] [place], [place] [address]    Report Status 02/23/2019 FINAL  Final  CULTURE, BLOOD (ROUTINE X 2) w Reflex to ID Panel     Status: None   Collection Time: 02/18/19 11:29 PM   Specimen: BLOOD  Result Value Ref Range Status   Specimen Description BLOOD LEFT ARM  Final   Special Requests   Final    BOTTLES DRAWN AEROBIC AND ANAEROBIC Blood Culture adequate volume   Culture   Final    NO GROWTH 5 DAYS Performed at [facility], [address], [place], [place] [address]    Report Status 02/23/2019 FINAL  Final    Coagulation Studies: No results for  input(s): LABPROT, INR in the last 72 hours.  Urinalysis: No results for input(s): COLORURINE, LABSPEC, PHURINE, GLUCOSEU, HGBUR, BILIRUBINUR, KETONESUR, PROTEINUR, UROBILINOGEN, NITRITE, LEUKOCYTESUR in the last 72 hours.  Invalid input(s): APPERANCEUR    Imaging: No results found.   Medications:   . [MAR Hold] sodium chloride Stopped (02/20/19 0101)  . sodium chloride 20 mL/hr at 02/23/19 1416  . [MAR Hold] ceFTAROline (TEFLARO) IV 250 mL/hr at 02/23/19 [id number]  . [MAR Hold] DAPTOmycin (CUBICIN)  IV Stopped (02/22/19 2235)  . heparin    . [MAR Hold] magnesium sulfate bolus IVPB    . [  MAR Hold] potassium chloride     . heparin      . [MAR Hold] aspirin  81 mg Oral Daily  . [MAR Hold] chlorhexidine  15 mL Mouth Rinse BID  . [MAR Hold] Chlorhexidine Gluconate Cloth  6 each Topical Q0600  . [MAR Hold] epoetin (EPOGEN/PROCRIT) injection  4,000 Units Intravenous Q M,W,F-HD  . [MAR Hold] feeding supplement (NEPRO CARB STEADY)  237 mL Oral TID BM  . fentaNYL      . [MAR Hold] fluticasone  2 spray Each Nare Daily  . heparin      . [MAR Hold] insulin aspart  0-9 Units Subcutaneous TID WC  . [MAR Hold] levothyroxine  88 mcg Oral QAC breakfast  . [MAR Hold] mouth rinse  15 mL Mouth Rinse q12n4p  . midazolam      . [MAR Hold] midodrine  10 mg Oral TID WC  . [MAR Hold] multivitamin  1 tablet Oral QHS  . [MAR Hold] pantoprazole (PROTONIX) IV  40 mg Intravenous Q24H  . [MAR Hold] venlafaxine  75 mg Oral BID WC  . [MAR Hold] zinc sulfate  220 mg Oral Daily   [MAR Hold] sodium chloride, fentaNYL, [MAR Hold] haloperidol lactate, midazolam, [MAR Hold] ondansetron (ZOFRAN) IV  Assessment/ Plan:  Jamie Arias is a 63 y.o. white female with end-stage renal disease on hemodialysis, atrial fibrillation, chronic diastolic congestive heart failure, coronary artery disease status post CABG, diabetes mellitus type II, hypertension, hyperlipidemia, hypothyroidism, Covid positive in July 2020,  recent nursing home stay in [place] Prolonged hospitalization for MRSA bacteremia/sepsis and now with acute encephalopathy  [facility] [address] Left AVG 81.5kg  1. End Stage Renal Disease: on hemodialysis. Seen and examined on hemodialysis treatment. Tolerating treatment well.  With complication of dialysis device - Angiogram after dialysis today shows no communication with bleed to AVG.  - Vascular to place tunneled catheter until her AVG site has healed.   2. Hypotension - midodrine  3. Anemia of chronic kidney disease: hemoglobin 7.7 - EPO with HD treatments.   4. Secondary Hyperparathyroidism: not currently on binders.   5. Acute Encephalopathy - Appreciate neurology input.  - Will see if Teflaro could be the culprit, asking pharmacy.    LOS: [place] 12/21/20203:14 PM

## 2019-02-23 NOTE — Consult Note (Signed)
[facility] Consult Note  MRN : [id number]  Jamie Arias is a 63 y.o. (1955/04/13) female who presents with chief complaint of  Chief Complaint  Patient presents with  . Altered Mental Status  .  History of Present Illness: I am asked to see the patient by Dr. Juleen China for bleeding and wound overlying her right arm AVG. the patient has had multiple interventions to this AV graft and has several stents already in place.  She has a wound near her arterial access site that is slightly smaller than a dime with oozing.  There has not been pulsatile bleeding from there but when the scab came off it has had diffuse oozing.  The patient is generally unwell and has a lot of swelling and bruising in that arm.  Her mental status is extremely poor and she is very somnolent today and unable to provide any history.  Current Facility-Administered Medications  Medication Dose Route Frequency Provider Last Rate Last Admin  . fentaNYL (SUBLIMAZE) 100 MCG/2ML injection           . heparin 1000 UNIT/ML injection           . midazolam (VERSED) 5 MG/5ML injection           . [MAR Hold] 0.9 %  sodium chloride infusion   Intravenous PRN Jennye Boroughs, MD   Stopped at 02/20/19 0101  . 0.9 %  sodium chloride infusion   Intravenous Continuous Algernon Huxley, MD 20 mL/hr at 02/23/19 1416 New Bag at 02/23/19 1416  . [MAR Hold] aspirin chewable tablet 81 mg  81 mg Oral Daily Sreenath, Sudheer B, MD      . Doug Sou Hold] ceftaroline (TEFLARO) 300 mg in sodium chloride 0.9 % 250 mL IVPB  300 mg Intravenous Q12H Berton Mount, RPH 250 mL/hr at 02/23/19 [id number] Rate Verify at 02/23/19 [id number]  . [MAR Hold] chlorhexidine (PERIDEX) 0.12 % solution 15 mL  15 mL Mouth Rinse BID Flora Lipps, MD   15 mL at 02/22/19 2116  . [MAR Hold] Chlorhexidine Gluconate Cloth 2 % PADS 6 each  6 each Topical Q0600 Jennye Boroughs, MD   6 each at 02/23/19 [phone]  . [MAR Hold] DAPTOmycin (CUBICIN) 650 mg in sodium  chloride 0.9 % IVPB  650 mg Intravenous Q48H Berton Mount, RPH   Stopped at 02/22/19 2235  . [MAR Hold] epoetin alfa (EPOGEN) injection 4,000 Units  4,000 Units Intravenous Q M,W,F-HD Ralene Muskrat B, MD   4,000 Units at 02/23/19 1018  . [MAR Hold] feeding supplement (NEPRO CARB STEADY) liquid 237 mL  237 mL Oral TID BM Kathie Dike, MD      . fentaNYL (SUBLIMAZE) injection    PRN Algernon Huxley, MD   25 mcg at 02/23/19 1428  . [MAR Hold] fluticasone (FLONASE) 50 MCG/ACT nasal spray 2 spray  2 spray Each Nare Daily Jennye Boroughs, MD   2 spray at 02/15/19 0950  . [MAR Hold] haloperidol lactate (HALDOL) injection 1 mg  1 mg Intravenous Q6H PRN Sreenath, Sudheer B, MD      . heparin ADULT infusion 100 units/mL (25000 units/251mL sodium chloride 0.45%)  900 Units/hr Intravenous Continuous Dallie Piles, RPH      . [MAR Hold] insulin aspart (novoLOG) injection 0-9 Units  0-9 Units Subcutaneous TID WC Jennye Boroughs, MD   2 Units at 02/23/19 [phone]  . [MAR Hold] levothyroxine (SYNTHROID) tablet 88 mcg  88 mcg Oral QAC breakfast Sreenath,  Sudheer B, MD      . Doug Sou Hold] magnesium sulfate IVPB 1 g 100 mL  1 g Intravenous Once Dallie Piles, [facility]      . [facility] Hold] MEDLINE mouth rinse  15 mL Mouth Rinse q12n4p Flora Lipps, MD   15 mL at 02/20/19 1832  . midazolam (VERSED) injection    PRN Algernon Huxley, MD   1 mg at 02/23/19 1428  . [MAR Hold] midodrine (PROAMATINE) tablet 10 mg  10 mg Oral TID WC Sreenath, Sudheer B, MD      . Doug Sou Hold] multivitamin (RENA-VIT) tablet 1 tablet  1 tablet Oral QHS Kathie Dike, MD      . Doug Sou Hold] ondansetron (ZOFRAN) injection 4 mg  4 mg Intravenous Q6H PRN Jennye Boroughs, MD   4 mg at 02/19/19 0920  . [MAR Hold] pantoprazole (PROTONIX) injection 40 mg  40 mg Intravenous Q24H Charlett Nose, RPH   40 mg at 02/22/19 2116  . [MAR Hold] potassium chloride 10 mEq in 100 mL IVPB  10 mEq Intravenous Q2H Dallie Piles, RPH      . [MAR Hold] venlafaxine  (EFFEXOR) tablet 75 mg  75 mg Oral BID WC Sreenath, Sudheer B, MD      . Doug Sou Hold] zinc sulfate capsule 220 mg  220 mg Oral Daily Sidney Ace, MD        Past Medical History:  Diagnosis Date  . Anemia   . Chronic diastolic CHF (congestive heart failure) ([place])    a. Due to ischemic cardiomyopathy. EF as low as 35%, improved to normal s/p CABG; b. echo 07/06/13: EF 55-60%, no RWMAs, mod TR, trivial pericardial effusion not c/w tamponade physiology;  c. 10/2015 Echo: EF 65%, Gr1 DD, triv AI, mild MR, mildly dil LA, mod TR, PASP 9mmHg.  Marland Kitchen Coronary artery disease    a. NSTEMI 06/2013; b.cath: severe three-vessel CAD w/ EF 30% & mild-mod MR; c. s/p 3 vessel CABG 07/02/13 (LIMA-LAD, SVG-OM, and SVG-RPDA);  d. 10/2015 MV: no ischemia/infarct.  . Diabetes mellitus without complication ([place])   . Diabetic neuropathy ([place])   . Dialysis patient [facility])    MWF  . ESRD (end stage renal disease) ([place])    a. 12/2015 initiated - mwf dialysis.  Marland Kitchen GERD (gastroesophageal reflux disease)   . Hyperlipidemia   . Hypertension   . Hypothyroidism   . Myocardial infarction ([place]) 2015  . Neuropathy   . Pleural effusion 2015  . Pulmonary hypertension ([place])   . Renal insufficiency   . Wears dentures    full lower    Past Surgical History:  Procedure Laterality Date  . A/V FISTULAGRAM Right 12/17/2016   Procedure: A/V Fistulagram;  Surgeon: Algernon Huxley, MD;  Location: [place] [facility];  Service: Cardiovascular;  Laterality: Right;  . A/V FISTULAGRAM Right 01/07/2017   Procedure: A/V Fistulagram;  Surgeon: Algernon Huxley, MD;  Location: [place] [facility];  Service: Cardiovascular;  Laterality: Right;  . A/V FISTULAGRAM Right 12/03/2017   Procedure: A/V FISTULAGRAM;  Surgeon: Katha Cabal, MD;  Location: [place] [facility];  Service: Cardiovascular;  Laterality: Right;  . A/V SHUNT INTERVENTION N/A 02/22/2017   Procedure: A/V SHUNT INTERVENTION;  Surgeon: Algernon Huxley, MD;  Location: [place]  [facility];  Service: Cardiovascular;  Laterality: N/A;  . AV FISTULA PLACEMENT Right 02/03/2016   Procedure: INSERTION OF ARTERIOVENOUS (AV) GORE-TEX GRAFT ARM ( BRACH / AXILLARY );  Surgeon: Dolores Lory  Schnier, MD;  Location: [facility];  Service: Vascular;  Laterality: Right;  . CARDIAC CATHETERIZATION    . CATARACT EXTRACTION Bilateral   . CHOLECYSTECTOMY N/A 12/09/2014   Procedure: LAPAROSCOPIC CHOLECYSTECTOMY;  Surgeon: Marlyce [name], MD;  Location: [facility];  Service: General;  Laterality: N/A;  . CORONARY ARTERY BYPASS GRAFT N/A 07/02/2013   Procedure: CORONARY ARTERY BYPASS GRAFTING (CABG);  Surgeon: Ivin [name], MD;  Location: [place];  Service: Open Heart Surgery;  Laterality: N/A;  CABG x three, using left internal mammary artery and right leg greater saphenous vein harvested endoscopically  . ESOPHAGOGASTRODUODENOSCOPY (EGD) WITH PROPOFOL N/A 11/24/2015   Procedure: ESOPHAGOGASTRODUODENOSCOPY (EGD) WITH PROPOFOL;  Surgeon: [name], MD;  Location: [place];  Service: Endoscopy;  Laterality: N/A;  Diabetic - insulin  . EYE SURGERY Bilateral    Cataract Extraction with IOL  . INTRAOPERATIVE TRANSESOPHAGEAL ECHOCARDIOGRAM N/A 07/02/2013   Procedure: INTRAOPERATIVE TRANSESOPHAGEAL ECHOCARDIOGRAM;  Surgeon: Ivin [name], MD;  Location: [place];  Service: Open Heart Surgery;  Laterality: N/A;  . PERIPHERAL VASCULAR CATHETERIZATION Right 12/06/2015   Procedure: Dialysis/Perma Catheter Insertion;  Surgeon: Katha Cabal, MD;  Location: [place] [facility];  Service: Cardiovascular;  Laterality: Right;  . PERIPHERAL VASCULAR THROMBECTOMY Right 09/28/2016   Procedure: Peripheral Vascular Thrombectomy;  Surgeon: Katha Cabal, MD;  Location: [place] [facility];  Service: Cardiovascular;  Laterality: Right;  . PERIPHERAL VASCULAR THROMBECTOMY Right 05/20/2018   Procedure: PERIPHERAL VASCULAR THROMBECTOMY;  Surgeon: Katha Cabal, MD;  Location: [place] [facility];   Service: Cardiovascular;  Laterality: Right;  . PORTA CATH REMOVAL N/A 06/01/2016   Procedure: Glori [name] Cath Removal;  Surgeon: Katha Cabal, MD;  Location: [place] [facility];  Service: Cardiovascular;  Laterality: N/A;  . THORACENTESIS Left 2015     Social History   Tobacco Use  . Smoking status: Never Smoker  . Smokeless tobacco: Never Used  Substance Use Topics  . Alcohol use: No    Alcohol/week: 0.0 standard drinks  . Drug use: No     Family History  Problem Relation Age of Onset  . COPD Mother   . Cancer Mother        Lung  . Pulmonary embolism Father   . Diabetes Father   . Diabetes Paternal Grandfather   . Heart disease Maternal Grandmother   . Colon cancer Neg Hx   . Colon polyps Neg Hx   . Esophageal cancer Neg Hx   . Pancreatic cancer Neg Hx   . Liver disease Neg Hx     Allergies  Allergen Reactions  . Nsaids Other (See Comments)    Contraindicated due to kidney disease.  Marland Kitchen Doxycycline Other (See Comments)    tremor     REVIEW OF SYSTEMS (Negative unless checked) Patient is unable to provide review of systems secondary to her very poor mental status  Physical Examination  Vitals:   02/23/19 1333 02/23/19 1416 02/23/19 1420 02/23/19 1425  BP: (!) 101/40 (!) 121/50 (!) 122/42 (!) 125/45  Pulse: 65 75 74 73  Resp: 16 13 12 14   Temp: 97.8 F (36.6 C)     TempSrc: Axillary     SpO2: 99% 100% 100% 100%  Weight: 73.4 kg     Height: 5\' 4"  (1.626 m)      Body mass index is 27.78 kg/m. Gen:  WD/WN, NAD.  Appears older than stated age Head: [place]/AT, No temporalis wasting.  Ear/Nose/Throat: Hearing grossly intact, nares w/o erythema  or drainage, oropharynx w/o Erythema/Exudate Eyes: Sclera non-icteric, conjunctiva clear Neck: Trachea midline.  Bruising present in the right neck from her previous central line Pulmonary:  Good air movement, respirations not labored Cardiac: RRR, normal S1, S2. Vascular: Right arm AV graft with strong thrill.  Open  wound near the arterial access site as described above.  No significant surrounding erythema or purulent drainage. Vessel Right Left  Radial Palpable Palpable   Musculoskeletal: M/S 5/5 throughout.  Extremities without ischemic changes.  No deformity or atrophy. No edema. Neurologic: Difficult to assess due to her poor mental status.  She does respond to painful stimuli. Psychiatric: Unable to assess due to her poor mental status Dermatologic: Wound overlying the arterial access site on the right arm AV graft in the upper arm      CBC Lab Results  Component Value Date   WBC 9.6 02/21/2019   HGB 7.7 (L) 02/21/2019   HCT 24.3 (L) 02/21/2019   MCV 88.4 02/21/2019   PLT 227 02/21/2019    BMET    Component Value Date/Time   NA 143 02/23/2019 0539   NA 140 03/30/2014 1611   K 2.9 (L) 02/23/2019 0539   K 4.3 03/30/2014 1611   CL 106 02/23/2019 0539   CL 105 03/30/2014 1611   CO2 21 (L) 02/23/2019 0539   CO2 29 03/30/2014 1611   GLUCOSE 197 (H) 02/23/2019 0539   GLUCOSE 187 (H) 03/30/2014 1611   BUN 17 02/23/2019 0539   BUN 21 (H) 03/30/2014 1611   CREATININE 4.21 (H) 02/23/2019 0539   CREATININE 3.14 (H) 09/09/2015 1534   CALCIUM 8.0 (L) 02/23/2019 0539   CALCIUM 9.0 03/30/2014 1611   GFRNONAA 10 (L) 02/23/2019 0539   GFRNONAA 36 (L) 03/30/2014 1611   GFRNONAA >60 07/23/2013 0741   GFRAA 12 (L) 02/23/2019 0539   GFRAA 44 (L) 03/30/2014 1611   GFRAA >60 07/23/2013 0741   Estimated Creatinine Clearance: 13.1 mL/min (A) (by C-G formula based on SCr of 4.21 mg/dL (H)).  COAG Lab Results  Component Value Date   INR 1.3 (H) 02/17/2019   INR 1.1 02/10/2019   INR 1.1 02/09/2019    Radiology EEG  Result Date: 02/11/2019 Lora Havens, MD     02/11/2019  7:37 PM Patient Name: AKAYSHA HANCOCK MRN: [id number] Epilepsy Attending: Lora Havens Referring Physician/Provider: Dr Vernard Gambles Date: 02/11/2019 Duration: 36.71mins Patient history: 63yo F with ams and jaw  twitching. EEG to evaluate for seizure Level of alertness: comatose AEDs during EEG study: None Technical aspects: This EEG study was done with scalp electrodes positioned according to the 10-20 International system of electrode placement. Electrical activity was acquired at a sampling rate of 500Hz  and reviewed with a high frequency filter of 70Hz  and a low frequency filter of 1Hz . EEG data were recorded continuously and digitally stored. DESCRIPTION: EEG showed continuous generalized 3-5hz  theta-delta slowing. Triphasic waves, generalized, maximal bifrontal, at 1.5-2Hz  were also seen. Multiple episodes of jaw twitching were recorded without eeg change. Hyperventilation and photic stimulation were not performed. ABNORMALITY - Continuous slow, generalized - Triphasic waves, generalized IMPRESSION: This study is suggestive of severe diffuse encephalopathy, non specific to etiology but could be secondary to toxic-metabolic causes. Multiple episodes of jaw twitching were recorded without EEG correlate and were most likely non epileptic. No seizures or epileptiform discharges were seen throughout the recording. Lora Havens   CT Head Wo Contrast  Result Date: 02/09/2019 CLINICAL DATA:  Altered level of consciousness,  altered mental status EXAM: CT HEAD WITHOUT CONTRAST TECHNIQUE: Contiguous axial images were obtained from the base of the skull through the vertex without intravenous contrast. COMPARISON:  CT head August 23, 2018, MRI October 10, 2017 FINDINGS: Brain: No evidence of acute infarction, hemorrhage, hydrocephalus, extra-axial collection or mass lesion/mass effect. Symmetric prominence of the ventricles, cisterns and sulci compatible with parenchymal volume loss. Patchy areas of white matter hypoattenuation are most compatible with chronic microvascular angiopathy. No Vascular: Atherosclerotic calcification of the carotid siphons and intradural vertebral arteries. No hyperdense vessel. Skull: No calvarial  fracture or suspicious osseous lesion. No scalp swelling or hematoma. Sinuses/Orbits: Paranasal sinuses and mastoid air cells are predominantly clear. Orbital structures are unremarkable aside from prior lens extractions. Other: None IMPRESSION: 1. No acute intracranial abnormality. 2. Mild parenchymal atrophy and chronic microvascular angiopathy. Electronically Signed   By: Lovena Le M.D.   On: 02/09/2019 15:33   CT CERVICAL SPINE W CONTRAST  Result Date: 02/17/2019 CLINICAL DATA:  Persistent MRSA bacteremia EXAM: CT CERVICAL SPINE WITH CONTRAST TECHNIQUE: Multidetector CT imaging of the cervical spine was performed during intravenous contrast administration. Multiplanar CT image reconstructions were also generated. CONTRAST:  114mL OMNIPAQUE IOHEXOL 300 MG/ML  SOLN COMPARISON:  2019 MRI FINDINGS: Alignment: Preserved. Skull base and vertebrae: Vertebral body heights are maintained. There is no cortical destruction. Soft tissues and spinal canal: No prevertebral soft tissue swelling. No evidence of an epidural collection within limitation of artifact obscuring the spinal canal. Disc levels: Multilevel degenerative changes are present without high-grade canal or foraminal stenosis. Upper chest: Partially imaged pleural effusions. Other: None. IMPRESSION: No evidence of discitis/osteomyelitis or collection. Electronically Signed   By: Macy Mis M.D.   On: 02/17/2019 15:57   CT THORACIC SPINE W CONTRAST  Result Date: 02/17/2019 CLINICAL DATA:  MRSA bacteremia EXAM: CT THORACIC SPINE WITH CONTRAST TECHNIQUE: Multidetector CT images of thoracic was performed according to the standard protocol following intravenous contrast administration. CONTRAST:  157mL OMNIPAQUE IOHEXOL 300 MG/ML  SOLN COMPARISON:  None. FINDINGS: Alignment: Preserved. Vertebrae: Vertebral body heights are maintained. There is no cortical destruction. Paraspinal and other soft tissues: Bilateral pleural effusions and bibasilar  atelectasis/consolidation. Partially imaged cardiomegaly. Enlarged main pulmonary artery suggesting pulmonary arterial hypertension. Disc levels: Mild multilevel degenerative changes. There is no high-grade osseous encroachment on the spinal canal or neural foramina. IMPRESSION: No evidence of discitis/osteomyelitis in the thoracic spine. No collection. Bilateral pleural effusions and bibasilar atelectasis/consolidation. Electronically Signed   By: Macy Mis M.D.   On: 02/17/2019 16:17   CT LUMBAR SPINE W CONTRAST  Result Date: 02/17/2019 CLINICAL DATA:  MRSA bacteremia EXAM: CT LUMBAR SPINE WITH CONTRAST TECHNIQUE: Multidetector CT imaging of the lumbar spine was performed with intravenous contrast administration. CONTRAST:  134mL OMNIPAQUE IOHEXOL 300 MG/ML  SOLN COMPARISON:  Via FINDINGS: Segmentation: 5 lumbar type vertebrae. Alignment: Normal. Vertebrae: Vertebral body heights are maintained. There is no cortical destruction. Paraspinal and other soft tissues: Mild presacral edema. No paraspinal collection. Disc levels: There is disc space narrowing and vacuum phenomenon at L5-S1. Multilevel facet hypertrophy is present. Left foraminal stenosis is present at L5-S1. No high-grade canal stenosis with suboptimal evaluation of the spinal canal due to artifact. IMPRESSION: No evidence of discitis/osteomyelitis in the lumbar spine. Nonspecific mild presacral edema. No paraspinal collection. Electronically Signed   By: Macy Mis M.D.   On: 02/17/2019 16:10   MR BRAIN WO CONTRAST  Result Date: 02/12/2019 CLINICAL DATA:  Encephalopathy and ataxia EXAM: MRI HEAD WITHOUT  CONTRAST TECHNIQUE: Multiplanar, multiecho pulse sequences of the brain and surrounding structures were obtained without intravenous contrast. COMPARISON:  Brain MRI 10/10/2017 FINDINGS: BRAIN: There is no acute infarct, acute hemorrhage or extra-axial collection. Multifocal white matter hyperintensity, most commonly due to chronic  ischemic microangiopathy. The cerebral and cerebellar volume are age-appropriate. There is no hydrocephalus. The midline structures are normal. VASCULAR: The major intracranial arterial and venous sinus flow voids are normal. Susceptibility-sensitive sequences show no chronic microhemorrhage or superficial siderosis. SKULL AND UPPER CERVICAL SPINE: Calvarial bone marrow signal is normal. There is no skull base mass. The visualized upper cervical spine and soft tissues are normal. SINUSES/ORBITS: There are no fluid levels or advanced mucosal thickening. The mastoid air cells and middle ear cavities are free of fluid. The orbits are normal. IMPRESSION: 1. No acute intracranial abnormality. 2. Mild chronic small vessel disease. Electronically Signed   By: Ulyses Jarred M.D.   On: 02/12/2019 22:17   US Venous Img Upper Uni Left (DVT)  Result Date: 02/11/2019 CLINICAL DATA:  Left upper extremity edema. History of pulmonary embolism. Patient is currently on anticoagulation. Evaluate for DVT. EXAM: LEFT UPPER EXTREMITY VENOUS DOPPLER ULTRASOUND TECHNIQUE: Gray-scale sonography with graded compression, as well as color Doppler and duplex ultrasound were performed to evaluate the upper extremity deep venous system from the level of the subclavian vein and including the jugular, axillary, basilic, radial, ulnar and upper cephalic vein. Spectral Doppler was utilized to evaluate flow at rest and with distal augmentation maneuvers. COMPARISON:  None. FINDINGS: Contralateral Subclavian Vein: Respiratory phasicity is normal and symmetric with the symptomatic side. No evidence of thrombus. Normal compressibility. Internal Jugular Vein: No evidence of thrombus. Normal compressibility, respiratory phasicity and response to augmentation. Subclavian Vein: No evidence of thrombus. Normal compressibility, respiratory phasicity and response to augmentation. Axillary Vein: No evidence of thrombus. Normal compressibility, respiratory  phasicity and response to augmentation. Cephalic Vein: No evidence of thrombus. Normal compressibility, respiratory phasicity and response to augmentation. Basilic Vein: No evidence of thrombus. Normal compressibility, respiratory phasicity and response to augmentation. Brachial Veins: There is age-indeterminate hypoechoic occlusive thrombus within the mid humeral aspect of one of the paired left brachial veins (image 19). The adjacent brachial vein appears patent where imaged. Radial Veins: No evidence of thrombus. Normal compressibility, respiratory phasicity and response to augmentation. Ulnar Veins: No evidence of thrombus. Normal compressibility, respiratory phasicity and response to augmentation. Venous Reflux:  None visualized. Other Findings:  None visualized. IMPRESSION: Examination is positive for age-indeterminate short segment occlusive DVT involving the mid humeral aspect of one of the paired brachial veins. There is no extension of this short-segment occlusive DVT to the more proximal venous system of the left upper extremity. Electronically Signed   By: Sandi Mariscal M.D.   On: 02/11/2019 11:34   DG Chest Port 1 View  Result Date: 02/09/2019 CLINICAL DATA:  Chest pain EXAM: PORTABLE CHEST 1 VIEW COMPARISON:  08/23/2018 FINDINGS: There is bilateral mild interstitial thickening. There is a trace left pleural effusion. There is no focal consolidation. There is no pneumothorax. There is stable cardiomegaly. There is evidence of prior CABG. There is no acute osseous abnormality. IMPRESSION: Cardiomegaly with mild pulmonary vascular congestion. Electronically Signed   By: Kathreen Devoid   On: 02/09/2019 14:26   DG Foot 2 Views Left  Result Date: 02/18/2019 CLINICAL DATA:  Left heel wound.  Rule out osteomyelitis. EXAM: LEFT FOOT - 2 VIEW COMPARISON:  None. FINDINGS: Plantar calcaneal spur. No bone destruction or lucency  to suggest osteomyelitis. No acute bony abnormality. Specifically, no fracture,  subluxation, or dislocation. Vascular calcifications. IMPRESSION: Calcaneal spur.  No evidence of osteomyelitis. Electronically Signed   By: Rolm Baptise M.D.   On: 02/18/2019 18:18   ECHOCARDIOGRAM COMPLETE  Result Date: 02/10/2019   ECHOCARDIOGRAM REPORT   Patient Name:   JASIRI SANDIN Date of Exam: 02/10/2019 Medical Rec #:  [id number]      Height:       64.0 in Accession #:    [id number]     Weight:       172.2 lb Date of Birth:  November 17, 1955      BSA:          1.84 m Patient Age:    44 years       BP:           82/44 mmHg Patient Gender: F              HR:           86 bpm. Exam Location:  [facility] Procedure: 2D Echo, Cardiac Doppler and Color Doppler Indications:     I48.91 Atrial Fibrillation  History:         Patient has prior history of Echocardiogram examinations, most                  recent 02/13/2018. Risk Factors:Hypertension, Dyslipidemia and                  Diabetes. Pulmonary hypertension. Pleural effusion. Myocardial                  infarction. Chronic diastolic heart failure. Coronary artery                  disease. End stage renal disease.  Sonographer:     Wilford Sports Rodgers-Jones Referring Phys:  [id number] Jennye Boroughs Diagnosing Phys: Nelva Bush MD IMPRESSIONS  1. Left ventricular ejection fraction, by visual estimation, is 60 to 65%. The left ventricle has normal function. There is mildly increased left ventricular hypertrophy.  2. Elevated left atrial pressure.  3. Left ventricular diastolic parameters are consistent with Grade II diastolic dysfunction (pseudonormalization).  4. The left ventricle has no regional wall motion abnormalities.  5. Global right ventricle has mildly reduced systolic function.The right ventricular size is mildly enlarged. Mildly increased right ventricular wall thickness.  6. Left atrial size was normal.  7. Right atrial size was normal.  8. Mild mitral annular calcification.  9. The mitral valve is normal in structure. Trace mitral valve regurgitation. No  evidence of mitral stenosis. 10. The tricuspid valve is normal in structure. Tricuspid valve regurgitation moderate-severe. 11. The aortic valve is tricuspid. Aortic valve regurgitation is trivial. Mild to moderate aortic valve sclerosis/calcification without any evidence of aortic stenosis. 12. The pulmonic valve was grossly normal. Pulmonic valve regurgitation is trivial. 13. Mildly elevated pulmonary artery systolic pressure. 14. The inferior vena cava is normal in size with <50% respiratory variability, suggesting right atrial pressure of 8 mmHg. FINDINGS  Left Ventricle: Left ventricular ejection fraction, by visual estimation, is 60 to 65%. The left ventricle has normal function. The left ventricle has no regional wall motion abnormalities. The left ventricular internal cavity size was the left ventricle is normal in size. There is mildly increased left ventricular hypertrophy. Left ventricular diastolic parameters are consistent with Grade II diastolic dysfunction (pseudonormalization). Elevated left atrial pressure. Right Ventricle: The right ventricular size is mildly enlarged. Mildly increased right ventricular  wall thickness. Global RV systolic function is has mildly reduced systolic function. The tricuspid regurgitant velocity is 2.74 m/s, and with an assumed right atrial pressure of 8 mmHg, the estimated right ventricular systolic pressure is mildly elevated at 38.0 mmHg. Left Atrium: Left atrial size was normal in size. Right Atrium: Right atrial size was normal in size Pericardium: There is no evidence of pericardial effusion. Mitral Valve: The mitral valve is normal in structure. Mild mitral annular calcification. No evidence of mitral valve stenosis by observation. Trace mitral valve regurgitation. Tricuspid Valve: The tricuspid valve is normal in structure. Tricuspid valve regurgitation moderate-severe. Aortic Valve: The aortic valve is tricuspid. . There is mild thickening and mild calcification  of the aortic valve. Aortic valve regurgitation is trivial. Mild to moderate aortic valve sclerosis/calcification is present, without any evidence of aortic stenosis. There is mild thickening of the aortic valve. There is mild calcification of the aortic valve. Pulmonic Valve: The pulmonic valve was grossly normal. Pulmonic valve regurgitation is trivial. No evidence of pulmonic stenosis. Aorta: The aortic root is normal in size and structure. Pulmonary Artery: The pulmonary artery is not well seen. Venous: The inferior vena cava is normal in size with less than 50% respiratory variability, suggesting right atrial pressure of 8 mmHg. IAS/Shunts: No atrial level shunt detected by color flow Doppler.  LEFT VENTRICLE PLAX 2D LVIDd:         3.77 cm  Diastology LVIDs:         2.71 cm  LV e' lateral:   9.36 cm/s LV PW:         1.08 cm  LV E/e' lateral: 13.1 LV IVS:        0.91 cm  LV e' medial:    6.53 cm/s LVOT diam:     1.90 cm  LV E/e' medial:  18.8 LV SV:         34 ml LV SV Index:   17.62 LVOT Area:     2.84 cm  RIGHT VENTRICLE RV Basal diam:  4.52 cm RV S prime:     9.03 cm/s TAPSE (M-mode): 1.5 cm LEFT ATRIUM             Index       RIGHT ATRIUM           Index LA diam:        3.80 cm 2.07 cm/m  RA Area:     16.10 cm LA Vol (A2C):   41.1 ml 22.39 ml/m RA Volume:   44.50 ml  24.24 ml/m LA Vol (A4C):   52.7 ml 28.71 ml/m LA Biplane Vol: 48.4 ml 26.37 ml/m  AORTIC VALVE LVOT Vmax:   107.50 cm/s LVOT Vmean:  80.450 cm/s LVOT VTI:    0.244 m  AORTA Ao Root diam: 3.00 cm MITRAL VALVE                         TRICUSPID VALVE MV Area (PHT): 3.08 cm              TR Peak grad:   30.0 mmHg MV PHT:        71.34 msec            TR Vmax:        274.00 cm/s MV Decel Time: 246 msec MV E velocity: 123.00 cm/s 103 cm/s  SHUNTS MV A velocity: 104.00 cm/s 70.3 cm/s Systemic VTI:  0.24 m MV E/A ratio:  1.18  1.5       Systemic Diam: 1.90 cm  Nelva Bush MD Electronically signed by Nelva Bush MD Signature  Date/Time: 02/10/2019/7:19:39 PM    Final    [place] RT UPPER EXTREM LTD SOFT TISSUE NON VASCULAR  Result Date: 02/17/2019 CLINICAL DATA:  Evaluate for abscess around AV fistula. MRSA bacteremia. EXAM: ULTRASOUND RIGHT UPPER EXTREMITY LIMITED TECHNIQUE: Ultrasound examination of the upper extremity soft tissues was performed in the area of clinical concern. COMPARISON:  None. FINDINGS: Visualized portions of the AV fistula are patent. There is a 4.6 x 3.1 x 3.1 cm hypoechoic fluid collection surrounding the distal fistula. There is no internal vascularity or surrounding hyperemia. IMPRESSION: 1. 4.6 x 3.1 x 3.1 cm hypoechoic fluid collection surrounding the distal AV fistula. Appearance is more suggestive of a small hematoma given the lack of surrounding hyperemia, especially if this is a common site of fistula access. Superimposed infection is thought to be unlikely, but cannot be entirely excluded by imaging alone. Electronically Signed   By: Titus Dubin M.D.   On: 02/17/2019 07:41   [place] EKG SITE RITE  Result Date: 02/20/2019 If Site Rite image not attached, placement could not be confirmed due to current cardiac rhythm.     Assessment/Plan 1.  Complication of dialysis access.  There is no open wound overlying her right arm AV graft.  This would be concerning for pseudoaneurysm and infection as she has had multiple previous interventions to this graft.  We are going to perform a fistulogram to see if there is active extravasation and if so either covered stent versus occlusion of the graft would be necessary.  I suspect a PermCath will be required to provide dialysis access as I do not think this arm should be used while this wound is in place. 2.  ESRD.  Currently using right arm graft with an open wound.  Will likely need a PermCath. 3.  Diabetes.  Likely an underlying cause of her renal failure and blood glucose control important in reducing the progression of atherosclerotic disease. Also,  involved in wound healing. On appropriate medications. 4.  Hypertension. Likely an underlying cause of her renal failure and blood pressure control important in reducing the progression of atherosclerotic disease. On appropriate oral medications.    Leotis Pain, MD  02/23/2019 2:29 PM    This note was created with Dragon medical transcription system.  Any error is purely unintentional

## 2019-02-23 NOTE — Progress Notes (Signed)
Nutrition Follow-up  DOCUMENTATION CODES:   Obesity unspecified  INTERVENTION:   Once tube is able to be placed, recommend:   Recommend Osmolite 1.5 _0 /hr- Initiate at 41m/hr and increase by 126mhr q 8 hours until goal rate is reached.   Prostat liquid protein 30 ml daily via tube, supplement provides 100 kcal, 15 grams protein.  Free water flushes 3033m4 hours to maintain tube patency   Regimen provides 1900kcal/day, 90g/day protein, 1094m35my free water.   B-complex with C daily via tube  Monitor magnesium, potassium, and phosphorus daily for at least 3 days, MD to replete as needed, as pt is at risk for refeeding syndrome.  NUTRITION DIAGNOSIS:   Increased nutrient needs related to chronic illness(ESRD on HD) as evidenced by estimated needs. Ongoing.  GOAL:   Patient will meet greater than or equal to 90% of their needs -not met    MONITOR:   Labs, Weight trends, TF tolerance, Skin, I & O's  REASON FOR ASSESSMENT:   Consult Enteral/tube feeding initiation and management  ASSESSMENT:   63 y26. female with end-stage renal disease on HD, atrial fibrillation, chronic diastolic congestive heart failure, CAD status post PCI, CABG,  type 2 diabetes, hypertension, hyperlipidemia, hypothyroidism, Covid positive in July 2020 admitted with AMS and found to have MRSA positive blood cultures   Pt continues to have poor appetite and oral intake. Palliative care following. Plan is for NGT placement and short term tube feeds to see if patient shows any improvement. Pt is at high refeed risk; recommend monitor electrolytes. Per chart, pt is down ~14lbs since admit.   Medications reviewed and include: aspirin, epogen, heparin, insulin, synthroid, rena-vite, protonix, zinc, daptomycin, KCl, Mg sulfate  Labs reviewed: K 2.9(L), creat 4.21(H), P 3.7 wnl, Mg 1.8 wnl Hgb 7.7(L), Hct 24.3(L) cbgs- 170, 88, 114 x 24 hrs  Diet Order:   Diet Order            Diet NPO time  specified  Diet effective now             EDUCATION NEEDS:   Not appropriate for education at this time  Skin:  Skin Assessment: Skin Integrity Issues:(stg II right buttocks; DTI right heel; unstageable left heel)  Last BM:  12/18  Height:   Ht Readings from Last 1 Encounters:  02/23/19 _1  (1.626 m)   Weight:   Wt Readings from Last 1 Encounters:  02/23/19 73.4 kg   Ideal Body Weight:  45 kg  BMI:  Body mass index is 27.78 kg/m.  Estimated Nutritional Needs:   Kcal:  1700-1900kcal/day  Protein:  85-95g/day  Fluid:  UOP +1L  CaseKoleen Distance RD, LDN Pager #- [phone]#- [phone] Hours Pager: [phone]

## 2019-02-23 NOTE — Progress Notes (Signed)
BP's in the 99991111 systolic post procedure. Called Dr. Frederica Kuster orders received for NS bolus 250 ml. IV's started to left femoral site. Pt. Responds with groaning sounds when turned side to side. Opens eyes to verbal stimuli occasional; more often with light physical stimulation.

## 2019-02-23 NOTE — Progress Notes (Signed)
Pt not in her room when attempted to see earlier. Chart reviewed, Pt mostly unchanged. ST to f/u 1-2 days.

## 2019-02-23 NOTE — Progress Notes (Addendum)
Pt. Received from dialysis via bed and transporter. Pt. Does not respond except for light physical stimulus which emits a groan. Pt. Does not speak, or , move extremities . K+ level drawn at 1335 post dialysis. Await result. ID verified via arm band with 2 RN's. Bilat. UE with 3+-4+ edema (hands to elbows).

## 2019-02-23 NOTE — Progress Notes (Signed)
This note also relates to the following rows which could not be included: Pulse Rate - Cannot attach notes to unvalidated device data Resp - Cannot attach notes to unvalidated device data  Hd completed  

## 2019-02-23 NOTE — Progress Notes (Signed)
PROGRESS NOTE    Jamie Arias  [id number] DOB: 08/28/1955 DOA: 02/09/2019 PCP: [name], MD    Brief Narrative:  63 year old female with end-stage renal disease on hemodialysis, admitted to the hospital with severe septic shock second encephalopathic and is unable to consent to the procedure ary to MRSA bacteremia. She had associated metabolic encephalopathy. She is being treated with intravenous vancomycin. She is not requiring any further pressors. Encephalopathy has been slow to improve.  Bacteremia has been persistent without any clear etiology.  CT spine unrevealing for any obvious infectious focus.  Infectious disease following.  Cardiology following.  Plan for inpatient transesophageal echocardiogram currently delayed as patient persistently encephalopathic and is unable to consent for the procedure  12/17: Remains encephalopathic.  Does not provide any reliable history.  Perseverates on the name [name].  12/18: Encephalopathy appears to be worsening.  Patient not eating.  Opens eyes but does not provide any meaningful history.  Off the list for TEE currently given encephalopathy.  Seen by neurology yesterday, discussed with Dr. [name], no acute neurologic findings and encephalopathy most likely related to underlying sepsis   12/19: patient's encephalopathy appears to be worsening.  She is not taking anything by mouth.  She is unable to swallow pills.  She has lost all IV access at this point. ICU contacted who agreed to place femoral central line on the floor.  Husband Mark at bedside.  Elected to change CODE STATUS to DO NOT RESUSCITATE  12/20: Patient remains encephalopathic.  Still not taking anything additional.  Femoral central line in place.  Had a brief moment this morning where she did respond to her name did not provide any other meaningful history.  12/21: Patient mains encephalopathic.  Not taking p.o. meds.  Not eating.  Femoral central line remains in place.   Opens her eyes when she hears her name but does not provide any reliable history.   Assessment & Plan:   Principal Problem:   Sepsis due to methicillin resistant Staphylococcus aureus (MRSA) ([place]) Active Problems:   Diabetes mellitus, type 2 (HCC)   Hypotension   Chronic diastolic CHF (congestive heart failure) (HCC)   ESRD (end stage renal disease) (HCC)   Altered level of consciousness   Pressure injury of skin   MRSA bacteremia   Arm DVT (deep venous thromboembolism), acute, left (HCC)   Acute encephalopathy   1. Septic shock secondary to MRSA bacteremia.  Shock resolved Source control not achieved Patient remains persistently bacteremic Source of bacteremia is unclear TTE unrevealing CT spine unrevealing for obvious infectious focus TEE planned however patient has been too encephalopathic and cannot consent to the procedure and presents a high risk for anesthesia Staph aureus isolated from heel wound Seen by podiatry, heel wound unlikely source of bacteremia Repeat cultures on 02/18/2019 no growth to date Plan: Continue current antibiotic treatment regimen of daptomycin and ceftaroline Follow blood cultures from 02/18/2019-no growth to date Continue to monitor mental status and neurologic recovery TEE on hold, unlikely that this will change management ID following   2. End-stage renal disease on hemodialysis.  Nephrology following for dialysis needs. Patient has very poor vascular access She has lost all of her peripheral IVs She has a left-sided femoral central venous catheter in place   3. Acute encephalopathy.  Possible sepsis related MRI of the brain did not show any acute infarct.  EEG did not show any evidence of seizures.  She was noted to have twitching in her lower jaw,  but her husband reports this is been present for the past several months.   Ammonia level is normal.   ABG does not show significant CO2 retention. -Continue antibiotic regimen for  treatment of MRSA bacteremia -Continue to monitor mental status closely, frequent reorientation measures -Neurology consult: 02/19/2019.  Discussed with Dr. [name].  Recommendations appreciated.  Per neurology lumbar puncture is unlikely to add diagnostic value.  Remainder of neurologic work-up essentially unrevealing.  Persistent encephalopathy likely related to underlying bacteremia and sepsis  -Encephalopathy appears to be worsening despite apparent clearance of bacteremia.  Unknown etiology but likely multifactorial.   We will continue antibiotic therapy however we will have to strongly consider transition to hospice/comfort measures should the patient not recover from his encephalopathy 12/21: Per last palliative care note they will meet with the family again today.  At this point unfortunately patient has demonstrated very little neurologic recovery.  Would recommend strong consideration for hospice/comfort measures.  Patient is not eating, though I feel that placement of an enteral feeding tube (NGT or PEG) would not improve her quality of life, and would increase the likelihood of a dangerous aspiration event.  4. Acute left arm DVT.  Noted on venous Dopplers.  Started on heparin infusion, but this was held due to oozing around central line.   Continue empiric anticoagulation And consider oral anticoagulation if patient is able to discharge Currently patient is on 3 times a week Lovenox, pharmacy following for anticoagulation monitoring Now that she has reliable IV access, we will switch back to heparin GTT for now  5. History of atrial fibrillation.  She was taking amiodarone and Eliquis prior to admission.  Currently in sinus rhythm.  Continue to follow heart rate.  On LMWH for left upper extremity DVT  6. Chronic diastolic congestive heart failure.  Appears to have significant edema. Likely multifactorial, cardiogenic versus hypoalbuminemia  Can hopefully remove volume with  dialysis.  7. Diabetes.  Continue on sliding scale insulin.  Blood sugars are currently stable.  8. Acute on chronic anemia.  Related to blood loss from oozing around IV sites.  Improved after 1 unit prbc on 12/13  9. Goals of care.   With patient's multiple medical issues, poor functional nutritional status, her prognosis is poor.   Spoke with husband [name] at [employer] to change CODE STATUS to DO NOT RESUSCITATE Palliative care to reevaluate patient on Monday 12/21 12/20: At this point patient is not demonstrating any signs of meaningful neurologic recovery despite apparent clearance of bacteremia.  Will await palliative care evaluation on Monday 12/21 however unfortunately it appears that hospice/palliative care may be the best option to give patient quality of life   DVT prophylaxis: Heparin GTT Code Status: Full code Family Communication: spoke with husband [name] at bedside 12/19 Disposition Plan: Pending clinical improvement  Consultants:   ID  Nephrology  Cardiology  Neurology  Procedures:   Right femoral CVC 12/8> removed  Right femoral art line 12/8> removed  Antimicrobials:   Vancomycin 12/7>12/14  Daptomycin 12/14>  ceftaroline 12/14>   Subjective: Patient seen and examined Husband Mark at bedside Patient encephalopathic, did not provide any more reliable history Not eating   Objective: Vitals:   02/23/19 1130 02/23/19 1145 02/23/19 1200 02/23/19 1215  BP: (!) 103/54 (!) 100/52 (!) 100/45 (!) 100/48  Pulse: 73 61 63 62  Resp: 16 (!) 21 15 18   Temp:      TempSrc:      SpO2:  Weight:      Height:        Intake/Output Summary (Last 24 hours) at 02/23/2019 1233 Last data filed at 02/23/2019 [id number] Gross per 24 hour  Intake 471.01 ml  Output --  Net 471.01 ml   Filed Weights   02/18/19 1050 02/20/19 1155 02/20/19 1545  Weight: 74.9 kg 74.9 kg 73.4 kg    Examination:  General exam: somnolent, but wakes up to  voice Respiratory system: diminished breath sounds bilaterally. Respiratory effort normal. Cardiovascular system:RRR. No murmurs, rubs, gallops. Gastrointestinal system: Abdomen is nondistended, soft and nontender. No organomegaly or masses felt. Normal bowel sounds heard. Central nervous system: No focal neurological deficits. Extremities: 1-2+ edema bilaterally Skin: No rashes, lesions or ulcers Psychiatry: somnolent.   Data Reviewed: I have personally reviewed following labs and imaging studies  CBC: Recent Labs  Lab 02/18/19 0215 02/19/19 2119 02/21/19 1159  WBC 10.6* 15.9* 9.6  HGB 9.4* 8.7* 7.7*  HCT 28.4* 25.6* 24.3*  MCV 84.3 83.1 88.4  PLT 177 218 [id number]   Basic Metabolic Panel: Recent Labs  Lab 02/17/19 0533 02/18/19 0215 02/18/19 1200 02/19/19 2119 02/21/19 1159 02/22/19 0531 02/23/19 0539  NA 139 137  --  142 141  --  143  K 4.0 4.0  --  3.0* 3.2* 3.1* 2.9*  CL 102 100  --  100 101  --  106  CO2 25 23  --  28 21*  --  21*  GLUCOSE 101* 128*  --  119* 184*  --  197*  BUN 14 18  --  11 10  --  17  CREATININE 3.27* 3.95*  --  3.29* 2.88*  --  4.21*  CALCIUM 8.1* 8.0*  --  8.4* 8.5*  --  8.0*  MG  --  1.7  --  1.9 1.7  --  1.8  PHOS 1.8* 2.7 3.0 2.9  --   --  3.7   GFR: Estimated Creatinine Clearance: 13.1 mL/min (A) (by C-G formula based on SCr of 4.21 mg/dL (H)). Liver Function Tests: Recent Labs  Lab 02/17/19 0533 02/19/19 2119  ALBUMIN 2.0* 2.2*   No results for input(s): LIPASE, AMYLASE in the last 168 hours. Recent Labs  Lab 02/17/19 0533 02/18/19 1334 02/19/19 2119  AMMONIA 16 10 12    Coagulation Profile: Recent Labs  Lab 02/17/19 2011  INR 1.3*   Cardiac Enzymes: Recent Labs  Lab 02/23/19 0539  CKTOTAL 29*   BNP (last 3 results) No results for input(s): PROBNP in the last 8760 hours. HbA1C: No results for input(s): HGBA1C in the last 72 hours. CBG: Recent Labs  Lab 02/22/19 0816 02/22/19 1152 02/22/19 1749 02/22/19 2145  02/23/19 0752  GLUCAP 201* 149* 121* 122* 170*   Lipid Profile: No results for input(s): CHOL, HDL, LDLCALC, TRIG, CHOLHDL, LDLDIRECT in the last 72 hours. Thyroid Function Tests: No results for input(s): TSH, T4TOTAL, FREET4, T3FREE, THYROIDAB in the last 72 hours. Anemia Panel: No results for input(s): VITAMINB12, FOLATE, FERRITIN, TIBC, IRON, RETICCTPCT in the last 72 hours. Sepsis Labs: No results for input(s): PROCALCITON, LATICACIDVEN in the last 168 hours.  Recent Results (from the past 240 hour(s))  CULTURE, BLOOD (ROUTINE X 2) w Reflex to ID Panel     Status: Abnormal   Collection Time: 02/13/19  2:18 PM   Specimen: BLOOD LEFT HAND  Result Value Ref Range Status   Specimen Description BLOOD LEFT HAND  Final   Special Requests   Final    BOTTLES  DRAWN AEROBIC AND ANAEROBIC Blood Culture results may not be optimal due to an inadequate volume of blood received in culture bottles   Culture  Setup Time   Final    AEROBIC BOTTLE ONLY GRAM POSITIVE COCCI IN CLUSTERS CRITICAL RESULT CALLED TO, READ BACK BY AND VERIFIED WITH: Herbert Pun AT [id number] ON 02/14/2019 [place]. Performed at [facility], [place] [address] [place], [place] [address]    Culture METHICILLIN RESISTANT STAPHYLOCOCCUS AUREUS (A)  Final   Report Status 02/16/2019 FINAL  Final   Organism ID, Bacteria METHICILLIN RESISTANT STAPHYLOCOCCUS AUREUS  Final      Susceptibility   Methicillin resistant staphylococcus aureus - MIC*    CIPROFLOXACIN >=8 RESISTANT Resistant     ERYTHROMYCIN >=8 RESISTANT Resistant     GENTAMICIN <=0.5 SENSITIVE Sensitive     OXACILLIN >=4 RESISTANT Resistant     TETRACYCLINE <=1 SENSITIVE Sensitive     VANCOMYCIN <=0.5 SENSITIVE Sensitive     TRIMETH/SULFA <=10 SENSITIVE Sensitive     CLINDAMYCIN RESISTANT Resistant     RIFAMPIN <=0.5 SENSITIVE Sensitive     Inducible Clindamycin POSITIVE Resistant     * METHICILLIN RESISTANT STAPHYLOCOCCUS AUREUS  Blood Culture ID Panel (Reflexed)      Status: Abnormal   Collection Time: 02/13/19  2:18 PM  Result Value Ref Range Status   Enterococcus species NOT DETECTED NOT DETECTED Final   Listeria monocytogenes NOT DETECTED NOT DETECTED Final   Staphylococcus species DETECTED (A) NOT DETECTED Final    Comment: CRITICAL RESULT CALLED TO, READ BACK BY AND VERIFIED WITH: ABBEY ELLINGTON AT [id number] ON 02/14/2019 [place].    Staphylococcus aureus (BCID) DETECTED (A) NOT DETECTED Final    Comment: Methicillin (oxacillin)-resistant Staphylococcus aureus (MRSA). MRSA is predictably resistant to beta-lactam antibiotics (except ceftaroline). Preferred therapy is vancomycin unless clinically contraindicated. Patient requires contact precautions if  hospitalized. CRITICAL RESULT CALLED TO, READ BACK BY AND VERIFIED WITH: Herbert Pun AT [id number] ON 02/14/2019 [place].    Methicillin resistance DETECTED (A) NOT DETECTED Final    Comment: CRITICAL RESULT CALLED TO, READ BACK BY AND VERIFIED WITH: Herbert Pun AT [id number] ON 02/14/2019 [place].    Streptococcus species NOT DETECTED NOT DETECTED Final   Streptococcus agalactiae NOT DETECTED NOT DETECTED Final   Streptococcus pneumoniae NOT DETECTED NOT DETECTED Final   Streptococcus pyogenes NOT DETECTED NOT DETECTED Final   Acinetobacter baumannii NOT DETECTED NOT DETECTED Final   Enterobacteriaceae species NOT DETECTED NOT DETECTED Final   Enterobacter cloacae complex NOT DETECTED NOT DETECTED Final   Escherichia coli NOT DETECTED NOT DETECTED Final   Klebsiella oxytoca NOT DETECTED NOT DETECTED Final   Klebsiella pneumoniae NOT DETECTED NOT DETECTED Final   Proteus species NOT DETECTED NOT DETECTED Final   Serratia marcescens NOT DETECTED NOT DETECTED Final   Haemophilus influenzae NOT DETECTED NOT DETECTED Final   Neisseria meningitidis NOT DETECTED NOT DETECTED Final   Pseudomonas aeruginosa NOT DETECTED NOT DETECTED Final   Candida albicans NOT DETECTED NOT DETECTED Final   Candida glabrata NOT  DETECTED NOT DETECTED Final   Candida krusei NOT DETECTED NOT DETECTED Final   Candida parapsilosis NOT DETECTED NOT DETECTED Final   Candida tropicalis NOT DETECTED NOT DETECTED Final    Comment: Performed at [facility], [address] [place], [place] [address]  CULTURE, BLOOD (ROUTINE X 2) w Reflex to ID Panel     Status: None   Collection Time: 02/13/19  3:09 PM   Specimen: BLOOD  Result Value  Ref Range Status   Specimen Description BLOOD BLOOD LEFT HAND  Final   Special Requests   Final    BOTTLES DRAWN AEROBIC AND ANAEROBIC Blood Culture adequate volume   Culture   Final    NO GROWTH 5 DAYS Performed at [facility], [address] [place], [place] [address]    Report Status 02/18/2019 FINAL  Final  Culture, blood (routine x 2)     Status: Abnormal   Collection Time: 02/15/19 12:49 PM   Specimen: BLOOD  Result Value Ref Range Status   Specimen Description   Final    BLOOD LT HAND Performed at [facility], [address], [place], [place] [address]    Special Requests   Final    BOTTLES DRAWN AEROBIC AND ANAEROBIC Blood Culture results may not be optimal due to an inadequate volume of blood received in culture bottles Performed at [facility], [address] [place], [place] [address]    Culture  Setup Time   Final    GRAM POSITIVE COCCI IN BOTH AEROBIC AND ANAEROBIC BOTTLES CRITICAL RESULT CALLED TO, READ BACK BY AND VERIFIED WITHEleonore Chiquito AT [id number] 02/16/2019 [place] Performed at [facility], [place] [place], [place] [address]    Culture (A)  Final    METHICILLIN RESISTANT STAPHYLOCOCCUS AUREUS STAPHYLOCOCCUS SPECIES (COAGULASE NEGATIVE) THE SIGNIFICANCE OF ISOLATING THIS ORGANISM FROM A SINGLE SET OF BLOOD CULTURES WHEN MULTIPLE SETS ARE DRAWN IS UNCERTAIN. PLEASE NOTIFY THE MICROBIOLOGY DEPARTMENT WITHIN ONE WEEK IF SPECIATION AND SENSITIVITIES ARE REQUIRED. Performed at [facility], [place]  [address], [place], [place] [address]    Report Status 02/18/2019 FINAL  Final   Organism ID, Bacteria METHICILLIN RESISTANT STAPHYLOCOCCUS AUREUS  Final      Susceptibility   Methicillin resistant staphylococcus aureus - MIC*    CIPROFLOXACIN >=8 RESISTANT Resistant     ERYTHROMYCIN >=8 RESISTANT Resistant     GENTAMICIN <=0.5 SENSITIVE Sensitive     OXACILLIN >=4 RESISTANT Resistant     TETRACYCLINE <=1 SENSITIVE Sensitive     VANCOMYCIN <=0.5 SENSITIVE Sensitive     TRIMETH/SULFA <=10 SENSITIVE Sensitive     CLINDAMYCIN RESISTANT Resistant     RIFAMPIN <=0.5 SENSITIVE Sensitive     Inducible Clindamycin POSITIVE Resistant     * METHICILLIN RESISTANT STAPHYLOCOCCUS AUREUS  Aerobic Culture (superficial specimen)     Status: None   Collection Time: 02/17/19  1:31 PM   Specimen: Heel; Wound  Result Value Ref Range Status   Specimen Description   Final    HEEL Performed at [facility], [address] [place], [place] [address]    Special Requests   Final    NONE Performed at [facility], [address] [place], [place] [address]    Gram Stain   Final    NO WBC SEEN MODERATE GRAM POSITIVE COCCI IN PAIRS    Culture   Final    ABUNDANT METHICILLIN RESISTANT STAPHYLOCOCCUS AUREUS ABUNDANT DIPHTHEROIDS(CORYNEBACTERIUM SPECIES) Standardized susceptibility testing for this organism is not available. Performed at [facility], [place] [address], [place], [place] [address]    Report Status 02/19/2019 FINAL  Final   Organism ID, Bacteria METHICILLIN RESISTANT STAPHYLOCOCCUS AUREUS  Final      Susceptibility   Methicillin resistant staphylococcus aureus - MIC*    CIPROFLOXACIN >=8 RESISTANT Resistant     ERYTHROMYCIN >=8 RESISTANT Resistant     GENTAMICIN <=0.5 SENSITIVE Sensitive  OXACILLIN >=4 RESISTANT Resistant     TETRACYCLINE <=1 SENSITIVE Sensitive     VANCOMYCIN 1 SENSITIVE Sensitive     TRIMETH/SULFA <=10 SENSITIVE Sensitive     CLINDAMYCIN  RESISTANT Resistant     RIFAMPIN <=0.5 SENSITIVE Sensitive     Inducible Clindamycin POSITIVE Resistant     * ABUNDANT METHICILLIN RESISTANT STAPHYLOCOCCUS AUREUS  CULTURE, BLOOD (ROUTINE X 2) w Reflex to ID Panel     Status: None   Collection Time: 02/18/19 11:29 PM   Specimen: BLOOD  Result Value Ref Range Status   Specimen Description BLOOD LEFT ARM  Final   Special Requests   Final    BOTTLES DRAWN AEROBIC AND ANAEROBIC Blood Culture adequate volume   Culture   Final    NO GROWTH 5 DAYS Performed at [facility], [place] [place], [place] [address]    Report Status 02/23/2019 FINAL  Final  CULTURE, BLOOD (ROUTINE X 2) w Reflex to ID Panel     Status: None   Collection Time: 02/18/19 11:29 PM   Specimen: BLOOD  Result Value Ref Range Status   Specimen Description BLOOD LEFT ARM  Final   Special Requests   Final    BOTTLES DRAWN AEROBIC AND ANAEROBIC Blood Culture adequate volume   Culture   Final    NO GROWTH 5 DAYS Performed at [facility], [address], [place], [place] [address]    Report Status 02/23/2019 FINAL  Final         Radiology Studies: No results found.      Scheduled Meds: . aspirin  81 mg Oral Daily  . chlorhexidine  15 mL Mouth Rinse BID  . Chlorhexidine Gluconate Cloth  6 each Topical Q0600  . enoxaparin (LOVENOX) injection  1 mg/kg Subcutaneous Q M,W,F-1800  . epoetin (EPOGEN/PROCRIT) injection  4,000 Units Intravenous Q M,W,F-HD  . feeding supplement (NEPRO CARB STEADY)  237 mL Oral TID BM  . fluticasone  2 spray Each Nare Daily  . insulin aspart  0-9 Units Subcutaneous TID WC  . levothyroxine  88 mcg Oral QAC breakfast  . mouth rinse  15 mL Mouth Rinse q12n4p  . midodrine  10 mg Oral TID WC  . multivitamin  1 tablet Oral QHS  . pantoprazole (PROTONIX) IV  40 mg Intravenous Q24H  . venlafaxine  75 mg Oral BID WC  . zinc sulfate  220 mg Oral Daily   Continuous Infusions: . sodium chloride Stopped (02/20/19  0101)  . ceFTAROline (TEFLARO) IV 250 mL/hr at 02/23/19 [id number]  . DAPTOmycin (CUBICIN)  IV Stopped (02/22/19 2235)  . magnesium sulfate bolus IVPB    . potassium chloride       LOS: 14 days    Time spent: 25mins    Sidney Ace, MD [facility] Pager: [phone]  If 7PM-7AM, please contact night-coverage [email or web address]  02/23/2019, 12:33 PM

## 2019-02-23 NOTE — Op Note (Signed)
[facility]    OPERATIVE NOTE   PROCEDURE: 1.  Right brachial artery to axillary vein arteriovenous graft cannulation under ultrasound guidance 2.  Right arm shuntogram   PRE-OPERATIVE DIAGNOSIS: 1. ESRD 2. Malfunctioning right brachial artery to axillary vein arteriovenous graft with an open wound  POST-OPERATIVE DIAGNOSIS: same as above   SURGEON: Leotis Pain, MD  ANESTHESIA: local with MCS  ESTIMATED BLOOD LOSS: 3 cc  FINDING(S): 1. AV graft was patent with only mild stenosis within the stents near the arterial access site.  No pseudoaneurysm or active communication between the graft and the wound.  Central venous circulation was widely patent.  SPECIMEN(S):  None  CONTRAST: 20 cc  FLUORO TIME: 1.3 minutes  MODERATE CONSCIOUS SEDATION TIME:  Approximately 15 minutes using 1 mg of Versed and 25 mcg of Fentanyl  INDICATIONS: Jamie Arias is a 63 y.o. female who presents with malfunctioning right brachial artery to axillary vein arteriovenous graft.  There is an open wound with bleeding.  The patient is scheduled for right arm shuntogram.  The patient is aware the risks include but are not limited to: bleeding, infection, thrombosis of the cannulated access, and possible anaphylactic reaction to the contrast.  The patient is aware of the risks of the procedure and elects to proceed forward.  DESCRIPTION: After full informed written consent was obtained, the patient was brought back to the angiography suite and placed supine upon the angiography table.  The patient was connected to monitoring equipment. Moderate conscious sedation was administered during a face to face encounter throughout the procedure with my supervision of the RN administering medicines and monitoring the patient's vital signs, pulse oximetry, telemetry and mental status throughout from the start of the procedure until the patient was taken to the recovery room The right arm was prepped  and draped in the standard fashion for a percutaneous access intervention.  Under ultrasound guidance, the right brachial artery to axillary vein arteriovenous graft was cannulated with a micropuncture needle under direct ultrasound guidance were it was patent and a permanent image was performed.  The microwire was advanced into the graft and the needle was exchanged for the a microsheath.  I then upsized to a 6 Fr Sheath and imaging was performed with a Kumpe catheter placed at the arterial anastomosis to evaluate the whole graft.  Hand injections were completed to image the access including the central venous system. This demonstrated an AV graft was patent with only mild stenosis within the stents near the arterial access site.  No pseudoaneurysm or active communication between the graft and the wound.  Central venous circulation was widely patent.  Based on the images, this patient will need a PermCath to try to allow the wound to heal but no intervention to the graft will be of any benefit at this point.  A 4-0 Monocryl purse-string suture was sewn around the sheath and I placed a 4-0 Monocryl suture over the wound to try to approximate this.  The sheath was removed while tying down the suture.  A sterile bandage was applied to the puncture site.  A PermCath we placed and it will be dictated separately.  COMPLICATIONS: None  CONDITION: Stable   Leotis Pain  02/23/2019 3:36 PM    This note was created with Dragon Medical transcription system. Any errors in dictation are purely unintentional.

## 2019-02-23 NOTE — Progress Notes (Addendum)
Date of Admission:  02/09/2019      ID: AIJA BOBADILLA is a 63 y.o. female  Principal Problem:   Sepsis due to methicillin resistant Staphylococcus aureus (MRSA) ([place]) Active Problems:   Diabetes mellitus, type 2 (HCC)   Hypotension   Chronic diastolic CHF (congestive heart failure) (HCC)   ESRD (end stage renal disease) (HCC)   Altered level of consciousness   Pressure injury of skin   MRSA bacteremia   Arm DVT (deep venous thromboembolism), acute, left (HCC)   Acute encephalopathy    Subjective: Still encephalopathic Non verbal No distress Underwent AV fistula exploration today Has a new dialysis cath on the left IJ  Medications:  . aspirin  81 mg Oral Daily  . B-complex with vitamin C  1 tablet Per Tube Daily  . chlorhexidine  15 mL Mouth Rinse BID  . Chlorhexidine Gluconate Cloth  6 each Topical Q0600  . epoetin (EPOGEN/PROCRIT) injection  4,000 Units Intravenous Q M,W,F-HD  . [START ON 02/24/2019] feeding supplement (PRO-STAT SUGAR FREE 64)  30 mL Per Tube Daily  . fentaNYL      . fluticasone  2 spray Each Nare Daily  . free water  30 mL Per Tube Q4H  . heparin      . heparin      . insulin aspart  0-9 Units Subcutaneous TID WC  . levothyroxine  88 mcg Oral QAC breakfast  . mouth rinse  15 mL Mouth Rinse q12n4p  . midazolam      . midodrine  10 mg Oral TID WC  . pantoprazole (PROTONIX) IV  40 mg Intravenous Q24H  . venlafaxine  75 mg Oral BID WC  . zinc sulfate  220 mg Oral Daily    Objective: Vital signs in last 24 hours: Temp:  [97.3 F (36.3 C)-99.1 F (37.3 C)] 97.3 F (36.3 C) (12/21 1710) Pulse Rate:  [61-246] 76 (12/21 1710) Resp:  [9-26] 14 (12/21 1710) BP: (79-140)/(24-57) 89/37 (12/21 1710) SpO2:  [85 %-100 %] 100 % (12/21 1710) Weight:  [73.4 kg] 73.4 kg (12/21 1333)  PHYSICAL EXAM:   obtunded No distress As per nurse she uttered a word  Chest b/l air entry Rt arm fistula site- no obviuos wound Forearm has scabs and bruise Chest  b/l air entry HS s1s2 Abd soft Lab Results Recent Labs    02/21/19 1159 02/22/19 0531 02/23/19 0539  WBC 9.6  --   --   HGB 7.7*  --   --   HCT 24.3*  --   --   NA 141  --  143  K 3.2* 3.1* 2.9*  CL 101  --  106  CO2 21*  --  21*  BUN 10  --  17  CREATININE 2.88*  --  4.21*   Liver Panel No results for input(s): PROT, ALBUMIN, AST, ALT, ALKPHOS, BILITOT, BILIDIR, IBILI in the last 72 hours. Sedimentation Rate No results for input(s): ESRSEDRATE in the last 72 hours. C-Reactive Protein No results for input(s): CRP in the last 72 hours.  Microbiology:  Studies/Results: PERIPHERAL VASCULAR CATHETERIZATION  Result Date: 02/23/2019 See op note    Assessment/Plan:  MRSA bacteremia- after being persistently bacteremic for a week- since 12/16 blood culture neg- on ceftaroline and dapto- I still am concerned the AV fistula with stents is the source and may have persistent infection ( spine imaging Ok, TEE could not be done)  Persistent encephalopathy- unclear etiology- her mental status has fluctuated with her  talking on some days  But of late none She also has not had any nutrition in many days Could that be a reason? Neurologist has seen her in the past- Would recommend LP to r/o both infectious and non infectious cause like Lga1 encephalitis and other autoimmune encephalitis  ABG to r/o co2 retention  ESRD   Post covid morbidity  Discussed  with the hospitalist

## 2019-02-23 NOTE — Progress Notes (Signed)
Pharmacy Electrolyte Monitoring Consult:  Pharmacy consulted to assist in monitoring and replacing electrolytes in this 63 y.o. female admitted on 02/09/2019. Patient with ESRD; last dialysis 12/14. Patient with MRSA bacteremia awaiting TEE. She has a h/o CAD and is s/p CABG  Labs:  Sodium (mmol/L)  Date Value  02/23/2019 143  03/30/2014 140   Potassium (mmol/L)  Date Value  02/23/2019 2.9 (L)  03/30/2014 4.3   Magnesium (mg/dL)  Date Value  02/23/2019 1.8  07/19/2013 1.7 (L)   Phosphorus (mg/dL)  Date Value  02/19/2019 2.9   Calcium (mg/dL)  Date Value  02/23/2019 8.0 (L)   Calcium, Total (mg/dL)  Date Value  03/30/2014 9.0   Albumin (g/dL)  Date Value  02/19/2019 2.2 (L)  02/11/2018 4.2  07/23/2013 2.7 (L)   Corrected Ca: 9.8 mg/dL  Goals of Therapy Given Cardiac History: Potassium 4.0 - 5.1 mmol/L Magnesium 2.0 - 2.4 mg/dL All Other Electrolytes WNL   Plan:  The patient continues to be unable to take medications by mouth due to confusion and disorientation  Replace magnesium with 1 gram IV magnesium sulfate  Replace potassium with 40 mEq IV KCl  BMP, magnesium level in am  Pharmacy will continue to monitor and adjust per consult.   Dallie Piles, [facility] 02/23/2019 8:02 AM

## 2019-02-24 ENCOUNTER — Inpatient Hospital Stay: Payer: Medicare Other

## 2019-02-24 ENCOUNTER — Encounter: Payer: Self-pay | Admitting: Cardiology

## 2019-02-24 ENCOUNTER — Ambulatory Visit: Payer: Medicare Other | Admitting: Family Medicine

## 2019-02-24 DIAGNOSIS — I480 Paroxysmal atrial fibrillation: Secondary | ICD-10-CM

## 2019-02-24 LAB — CBC
HCT: 20.9 % — ABNORMAL LOW (ref 36.0–46.0)
Hemoglobin: 6.8 g/dL — ABNORMAL LOW (ref 12.0–15.0)
MCH: 28.3 pg (ref 26.0–34.0)
MCHC: 32.5 g/dL (ref 30.0–36.0)
MCV: 87.1 fL (ref 80.0–100.0)
Platelets: 153 10*3/uL (ref 150–400)
RBC: 2.4 MIL/uL — ABNORMAL LOW (ref 3.87–5.11)
RDW: 20.3 % — ABNORMAL HIGH (ref 11.5–15.5)
WBC: 5.7 10*3/uL (ref 4.0–10.5)
nRBC: 0 % (ref 0.0–0.2)

## 2019-02-24 LAB — GLUCOSE, CAPILLARY
Glucose-Capillary: 160 mg/dL — ABNORMAL HIGH (ref 70–99)
Glucose-Capillary: 174 mg/dL — ABNORMAL HIGH (ref 70–99)
Glucose-Capillary: 190 mg/dL — ABNORMAL HIGH (ref 70–99)
Glucose-Capillary: 190 mg/dL — ABNORMAL HIGH (ref 70–99)

## 2019-02-24 LAB — PHOSPHORUS: Phosphorus: 2.8 mg/dL (ref 2.5–4.6)

## 2019-02-24 LAB — BASIC METABOLIC PANEL
Anion gap: 11 (ref 5–15)
BUN: 9 mg/dL (ref 8–23)
CO2: 25 mmol/L (ref 22–32)
Calcium: 8 mg/dL — ABNORMAL LOW (ref 8.9–10.3)
Chloride: 104 mmol/L (ref 98–111)
Creatinine, Ser: 2.45 mg/dL — ABNORMAL HIGH (ref 0.44–1.00)
GFR calc Af Amer: 23 mL/min — ABNORMAL LOW (ref >60)
GFR calc non Af Amer: 20 mL/min — ABNORMAL LOW (ref >60)
Glucose, Bld: 188 mg/dL — ABNORMAL HIGH (ref 70–99)
Potassium: 3.3 mmol/L — ABNORMAL LOW (ref 3.5–5.1)
Sodium: 140 mmol/L (ref 135–145)

## 2019-02-24 LAB — PREPARE RBC (CROSSMATCH)

## 2019-02-24 LAB — HEPARIN LEVEL (UNFRACTIONATED)
Heparin Unfractionated: 0.39 IU/mL (ref 0.30–0.70)
Heparin Unfractionated: 0.63 IU/mL (ref 0.30–0.70)

## 2019-02-24 LAB — MAGNESIUM: Magnesium: 2 mg/dL (ref 1.7–2.4)

## 2019-02-24 MED ORDER — POTASSIUM CHLORIDE 10 MEQ/100ML IV SOLN
10.0000 meq | INTRAVENOUS | Status: AC
  Administered 2019-02-24 (×2): 10 meq via INTRAVENOUS
  Filled 2019-02-24 (×2): qty 100

## 2019-02-24 MED ORDER — POTASSIUM CHLORIDE 10 MEQ/100ML IV SOLN: 10.0000 meq | INTRAVENOUS | Status: DC

## 2019-02-24 MED ORDER — SODIUM CHLORIDE 0.9% IV SOLUTION: Freq: Once | INTRAVENOUS | Status: AC

## 2019-02-24 NOTE — Progress Notes (Signed)
OT Cancellation Note  Patient Details Name: Jamie Arias MRN: [id number] DOB: 02-28-1956   Cancelled Treatment:    Reason Eval/Treat Not Completed: Medical issues which prohibited therapy(Pt. hgb is 6.8 which is contraindicated for OT servicea at this time. Will continue to monitor, and intervene when medically appropriate.)  Harrel Carina, MS,  OTR/L 02/24/2019, 10:14 AM

## 2019-02-24 NOTE — Progress Notes (Signed)
Spoke to [employer] regarding patient's Hgb -6.8. Randol Kern will leave decision to transfuse to day shift team due to palliative consult and family concerns. Patient is stable.  Will continue to monitor.  Christene Slates 02/24/2019  4:38 AM

## 2019-02-24 NOTE — Care Management Important Message (Signed)
Important Message  Patient Details  Name: Jamie Arias MRN: [id number] Date of Birth: 1956/02/19   Medicare Important Message Given:  Yes     Cecil Cobbs 02/24/2019, 6:34 PM

## 2019-02-24 NOTE — Progress Notes (Signed)
Pharmacy Electrolyte Monitoring Consult:  Pharmacy consulted to assist in monitoring and replacing electrolytes in this 63 y.o. female admitted on 02/09/2019. Patient with ESRD; last dialysis 12/14. Patient with MRSA bacteremia awaiting TEE. She has a h/o CAD and is s/p CABG  Labs:  Sodium (mmol/L)  Date Value  02/24/2019 140  03/30/2014 140   Potassium (mmol/L)  Date Value  02/24/2019 3.3 (L)  03/30/2014 4.3   Magnesium (mg/dL)  Date Value  02/24/2019 2.0  07/19/2013 1.7 (L)   Phosphorus (mg/dL)  Date Value  02/24/2019 2.8   Calcium (mg/dL)  Date Value  02/24/2019 8.0 (L)   Calcium, Total (mg/dL)  Date Value  03/30/2014 9.0   Albumin (g/dL)  Date Value  02/19/2019 2.2 (L)  02/11/2018 4.2  07/23/2013 2.7 (L)   Corrected Ca: 9.8 mg/dL  Goals of Therapy Given Cardiac History: Potassium 4.0 - 5.1 mmol/L Magnesium 2.0 - 2.4 mg/dL All Other Electrolytes WNL   Plan:  The patient continues to be unable to take medications by mouth due to confusion and disorientation. An NG-tube is being placed today  Replace potassium with 20 mEq IV KCl  BMP, magnesium level in am  Pharmacy will continue to monitor and adjust per consult.   Dallie Piles, [facility] 02/24/2019 7:19 AM

## 2019-02-24 NOTE — Progress Notes (Signed)
[facility]  ROUNDING NOTE   Subjective:   Responded to verbal stimuli this morning.   Scheduled for PRBC transfusion today.   Yesterday, continued bleeding from above AVG so stitch was placed by vascular and permcath placed.   NGT to be placed today   Objective:  Vital signs in last 24 hours:  Temp:  [97.3 F (36.3 C)-98.4 F (36.9 C)] 97.8 F (36.6 C) (12/22 1147) Pulse Rate:  [60-246] 68 (12/22 1147) Resp:  [9-26] 18 (12/22 1147) BP: (79-149)/(24-50) 149/46 (12/22 1147) SpO2:  [85 %-100 %] 99 % (12/22 1147) Weight:  [73.4 kg] 73.4 kg (12/21 1333)  Weight change:  Filed Weights   02/20/19 1155 02/20/19 1545 02/23/19 1333  Weight: 74.9 kg 73.4 kg 73.4 kg    Intake/Output: I/O last 3 completed shifts: In: 551.8 [I.V.:80.8; IV Piggyback:471] Out: 0    Intake/Output this shift:  No intake/output data recorded.  Physical Exam: General: NAD, laying in bed  Head: Normocephalic, atraumatic. Moist oral mucosal membranes  Eyes: Anicteric, PERRL  Neck: Supple, trachea midline  Lungs:  Clear to auscultation  Heart: Regular rate and rhythm  Abdomen:  Soft, nontender,   Extremities: + peripheral edema.  Neurologic: Lethargic, not following commands, responds to verbal stimuli.   Skin: No lesions  Access: Left AVG, bleeding over access    Basic Metabolic Panel: Recent Labs  Lab 02/18/19 0215 02/18/19 1200 02/19/19 2119 02/21/19 1159 02/22/19 0531 02/23/19 0539 02/24/19 0241  NA 137  --  142 141  --  143 140  K 4.0  --  3.0* 3.2* 3.1* 2.9* 3.3*  CL 100  --  100 101  --  106 104  CO2 23  --  28 21*  --  21* 25  GLUCOSE 128*  --  119* 184*  --  197* 188*  BUN 18  --  11 10  --  17 9  CREATININE 3.95*  --  3.29* 2.88*  --  4.21* 2.45*  CALCIUM 8.0*  --  8.4* 8.5*  --  8.0* 8.0*  MG 1.7  --  1.9 1.7  --  1.8 2.0  PHOS 2.7 3.0 2.9  --   --  3.7 2.8    Liver Function Tests: Recent Labs  Lab 02/19/19 2119  ALBUMIN 2.2*   No results for  input(s): LIPASE, AMYLASE in the last 168 hours. Recent Labs  Lab 02/18/19 1334 02/19/19 2119  AMMONIA 10 12    CBC: Recent Labs  Lab 02/18/19 0215 02/19/19 2119 02/21/19 1159 02/24/19 0241  WBC 10.6* 15.9* 9.6 5.7  HGB 9.4* 8.7* 7.7* 6.8*  HCT 28.4* 25.6* 24.3* 20.9*  MCV 84.3 83.1 88.4 87.1  PLT 177 218 227 153    Cardiac Enzymes: Recent Labs  Lab 02/23/19 0539  CKTOTAL 29*    BNP: Invalid input(s): POCBNP  CBG: Recent Labs  Lab 02/23/19 1333 02/23/19 1549 02/23/19 1706 02/23/19 2133 02/24/19 0748  GLUCAP 114* 95 106* 148* 160*    Microbiology: Results for orders placed or performed during the hospital encounter of 02/09/19  Culture, blood (routine x 2)     Status: Abnormal   Collection Time: 02/09/19  2:29 PM   Specimen: BLOOD LEFT ARM  Result Value Ref Range Status   Specimen Description   Final    BLOOD LEFT ARM Performed at [facility], [address] [place], [place] [address]    Special Requests   Final    BOTTLES DRAWN AEROBIC AND ANAEROBIC  Blood Culture adequate volume Performed at [facility], [address] [place], [place] [address]    Culture  Setup Time   Final    GRAM POSITIVE COCCI IN BOTH AEROBIC AND ANAEROBIC BOTTLES CRITICAL VALUE NOTED.  VALUE IS CONSISTENT WITH PREVIOUSLY REPORTED AND CALLED VALUE. Performed at [facility], [address] [place], [place] [address]    Culture (A)  Final    STAPHYLOCOCCUS AUREUS SUSCEPTIBILITIES PERFORMED ON PREVIOUS CULTURE WITHIN THE LAST 5 DAYS. Performed at [facility], [place] [address], [place], [place] [address]    Report Status 02/12/2019 FINAL  Final  Culture, blood (routine x 2)     Status: Abnormal   Collection Time: 02/09/19  2:38 PM   Specimen: BLOOD LEFT ARM  Result Value Ref Range Status   Specimen Description   Final    BLOOD LEFT ARM Performed at [facility], [address], [place], [place] [address]    Special  Requests   Final    BOTTLES DRAWN AEROBIC AND ANAEROBIC Blood Culture adequate volume Performed at [facility], [address], [place], [place] [address]    Culture  Setup Time   Final    GRAM POSITIVE COCCI IN BOTH AEROBIC AND ANAEROBIC BOTTLES CRITICAL RESULT CALLED TO, READ BACK BY AND VERIFIED WITH: [place] [id number] 02/10/2019 HNM Performed at [facility], [place] [address], [place], [place] [address]    Culture METHICILLIN RESISTANT STAPHYLOCOCCUS AUREUS (A)  Final   Report Status 02/12/2019 FINAL  Final   Organism ID, Bacteria METHICILLIN RESISTANT STAPHYLOCOCCUS AUREUS  Final      Susceptibility   Methicillin resistant staphylococcus aureus - MIC*    CIPROFLOXACIN >=8 RESISTANT Resistant     ERYTHROMYCIN >=8 RESISTANT Resistant     GENTAMICIN <=0.5 SENSITIVE Sensitive     OXACILLIN >=4 RESISTANT Resistant     TETRACYCLINE <=1 SENSITIVE Sensitive     VANCOMYCIN 1 SENSITIVE Sensitive     TRIMETH/SULFA <=10 SENSITIVE Sensitive     CLINDAMYCIN RESISTANT Resistant     RIFAMPIN <=0.5 SENSITIVE Sensitive     Inducible Clindamycin POSITIVE Resistant     * METHICILLIN RESISTANT STAPHYLOCOCCUS AUREUS  Blood Culture ID Panel (Reflexed)     Status: Abnormal   Collection Time: 02/09/19  2:38 PM  Result Value Ref Range Status   Enterococcus species NOT DETECTED NOT DETECTED Final   Listeria monocytogenes NOT DETECTED NOT DETECTED Final   Staphylococcus species DETECTED (A) NOT DETECTED Final    Comment: CRITICAL RESULT CALLED TO, READ BACK BY AND VERIFIED WITH: Hart Robinsons PHARMD [id number] 02/10/2019 HNM    Staphylococcus aureus (BCID) DETECTED (A) NOT DETECTED Final    Comment: Methicillin (oxacillin)-resistant Staphylococcus aureus (MRSA). MRSA is predictably resistant to beta-lactam antibiotics (except ceftaroline). Preferred therapy is vancomycin unless clinically contraindicated. Patient requires contact precautions if  hospitalized. CRITICAL RESULT CALLED TO, READ  BACK BY AND VERIFIED WITH: Hart Robinsons PHARMD [id number] 02/10/2019 HNM    Methicillin resistance DETECTED (A) NOT DETECTED Final    Comment: CRITICAL RESULT CALLED TO, READ BACK BY AND VERIFIED WITH: [place] Robinsons PHARMD [id number] 02/10/2019 HNM    Streptococcus species NOT DETECTED NOT DETECTED Final   Streptococcus agalactiae NOT DETECTED NOT DETECTED Final   Streptococcus pneumoniae NOT DETECTED NOT DETECTED Final   Streptococcus pyogenes NOT DETECTED NOT DETECTED Final   Acinetobacter baumannii NOT DETECTED NOT DETECTED Final   Enterobacteriaceae species NOT DETECTED NOT DETECTED Final   Enterobacter  cloacae complex NOT DETECTED NOT DETECTED Final   Escherichia coli NOT DETECTED NOT DETECTED Final   Klebsiella oxytoca NOT DETECTED NOT DETECTED Final   Klebsiella pneumoniae NOT DETECTED NOT DETECTED Final   Proteus species NOT DETECTED NOT DETECTED Final   Serratia marcescens NOT DETECTED NOT DETECTED Final   Haemophilus influenzae NOT DETECTED NOT DETECTED Final   Neisseria meningitidis NOT DETECTED NOT DETECTED Final   Pseudomonas aeruginosa NOT DETECTED NOT DETECTED Final   Candida albicans NOT DETECTED NOT DETECTED Final   Candida glabrata NOT DETECTED NOT DETECTED Final   Candida krusei NOT DETECTED NOT DETECTED Final   Candida parapsilosis NOT DETECTED NOT DETECTED Final   Candida tropicalis NOT DETECTED NOT DETECTED Final    Comment: Performed at [facility], [place], [place] [address]  SARS CORONAVIRUS 2 (TAT 6-24 HRS) Nasopharyngeal Nasopharyngeal Swab     Status: None   Collection Time: 02/09/19  2:54 PM   Specimen: Nasopharyngeal Swab  Result Value Ref Range Status   SARS Coronavirus 2 NEGATIVE NEGATIVE Final    Comment: (NOTE) SARS-CoV-2 target nucleic acids are NOT DETECTED. The SARS-CoV-2 RNA is generally detectable in upper and lower respiratory specimens during the acute phase of infection. Negative results do not preclude SARS-CoV-2 infection, do  not rule out co-infections with other pathogens, and should not be used as the sole basis for treatment or other patient management decisions. Negative results must be combined with clinical observations, patient history, and epidemiological information. The expected result is Negative. Fact Sheet for Patients: SugarRoll.be Fact Sheet for Healthcare Providers: https://www.woods-mathews.com/ This test is not yet approved or cleared by the [place] FDA and  has been authorized for detection and/or diagnosis of SARS-CoV-2 by FDA under an Emergency Use Authorization (EUA). This EUA will remain  in effect (meaning this test can be used) for the duration of the COVID-19 declaration under Section 56 4(b)(1) of the Act, 21 U.S.C. section 360bbb-3(b)(1), unless the authorization is terminated or revoked sooner. Performed at [facility], [place] [address] [place], [place] [address]   MRSA PCR Screening     Status: Abnormal   Collection Time: 02/10/19  5:03 PM   Specimen: Nasopharyngeal  Result Value Ref Range Status   MRSA by PCR POSITIVE (A) NEGATIVE Final    Comment:        The GeneXpert MRSA Assay (FDA approved for NASAL specimens only), is one component of a comprehensive MRSA colonization surveillance program. It is not intended to diagnose MRSA infection nor to guide or monitor treatment for MRSA infections. RESULT CALLED TO, READ BACK BY AND VERIFIED WITH: ALEKSEY FRASER @1839  02/10/19 MJU Performed at [facility], [address] [place], [place] [address]   Culture, blood (routine x 2)     Status: Abnormal   Collection Time: 02/11/19 12:31 PM   Specimen: BLOOD  Result Value Ref Range Status   Specimen Description   Final    BLOOD PORTA CATH Performed at [place] [address], [place], [place] [address]    Special Requests   Final    BOTTLES DRAWN AEROBIC AND ANAEROBIC Blood Culture adequate  volume Performed at [facility], [address] [place], [place] [address]    Culture  Setup Time   Final    GRAM POSITIVE COCCI ANAEROBIC BOTTLE ONLY CRITICAL RESULT CALLED TO, READ BACK BY AND VERIFIED WITH: SCOTT HALL AT [id number] 02/12/2019 SNG  Performed at [facility],  [place], [place] [address]    Culture (A)  Final    STAPHYLOCOCCUS AUREUS SUSCEPTIBILITIES PERFORMED ON PREVIOUS CULTURE WITHIN THE LAST 5 DAYS. Performed at [facility], [place] [address], [place], [place] [address]    Report Status 02/13/2019 FINAL  Final  Culture, blood (routine x 2)     Status: None   Collection Time: 02/11/19  3:57 PM   Specimen: BLOOD  Result Value Ref Range Status   Specimen Description BLOOD PORTA CATH  Final   Special Requests   Final    BOTTLES DRAWN AEROBIC AND ANAEROBIC Blood Culture adequate volume   Culture   Final    NO GROWTH 5 DAYS Performed at [facility], [address] [place], [place] [address]    Report Status 02/16/2019 FINAL  Final  CULTURE, BLOOD (ROUTINE X 2) w Reflex to ID Panel     Status: Abnormal   Collection Time: 02/13/19  2:18 PM   Specimen: BLOOD LEFT HAND  Result Value Ref Range Status   Specimen Description BLOOD LEFT HAND  Final   Special Requests   Final    BOTTLES DRAWN AEROBIC AND ANAEROBIC Blood Culture results may not be optimal due to an inadequate volume of blood received in culture bottles   Culture  Setup Time   Final    AEROBIC BOTTLE ONLY GRAM POSITIVE COCCI IN CLUSTERS CRITICAL RESULT CALLED TO, READ BACK BY AND VERIFIED WITH: Herbert Pun AT [id number] ON 02/14/2019 [place]. Performed at [facility], [place] [address] [place], [place] [address]    Culture METHICILLIN RESISTANT STAPHYLOCOCCUS AUREUS (A)  Final   Report Status 02/16/2019 FINAL  Final   Organism ID, Bacteria METHICILLIN RESISTANT STAPHYLOCOCCUS AUREUS  Final      Susceptibility   Methicillin resistant staphylococcus aureus -  MIC*    CIPROFLOXACIN >=8 RESISTANT Resistant     ERYTHROMYCIN >=8 RESISTANT Resistant     GENTAMICIN <=0.5 SENSITIVE Sensitive     OXACILLIN >=4 RESISTANT Resistant     TETRACYCLINE <=1 SENSITIVE Sensitive     VANCOMYCIN <=0.5 SENSITIVE Sensitive     TRIMETH/SULFA <=10 SENSITIVE Sensitive     CLINDAMYCIN RESISTANT Resistant     RIFAMPIN <=0.5 SENSITIVE Sensitive     Inducible Clindamycin POSITIVE Resistant     * METHICILLIN RESISTANT STAPHYLOCOCCUS AUREUS  Blood Culture ID Panel (Reflexed)     Status: Abnormal   Collection Time: 02/13/19  2:18 PM  Result Value Ref Range Status   Enterococcus species NOT DETECTED NOT DETECTED Final   Listeria monocytogenes NOT DETECTED NOT DETECTED Final   Staphylococcus species DETECTED (A) NOT DETECTED Final    Comment: CRITICAL RESULT CALLED TO, READ BACK BY AND VERIFIED WITH: ABBEY ELLINGTON AT [id number] ON 02/14/2019 [place].    Staphylococcus aureus (BCID) DETECTED (A) NOT DETECTED Final    Comment: Methicillin (oxacillin)-resistant Staphylococcus aureus (MRSA). MRSA is predictably resistant to beta-lactam antibiotics (except ceftaroline). Preferred therapy is vancomycin unless clinically contraindicated. Patient requires contact precautions if  hospitalized. CRITICAL RESULT CALLED TO, READ BACK BY AND VERIFIED WITH: Herbert Pun AT [id number] ON 02/14/2019 [place].    Methicillin resistance DETECTED (A) NOT DETECTED Final    Comment: CRITICAL RESULT CALLED TO, READ BACK BY AND VERIFIED WITH: Herbert Pun AT [id number] ON 02/14/2019 [place].    Streptococcus species NOT DETECTED NOT DETECTED Final   Streptococcus agalactiae NOT DETECTED NOT DETECTED Final   Streptococcus pneumoniae NOT DETECTED NOT DETECTED Final   Streptococcus  pyogenes NOT DETECTED NOT DETECTED Final   Acinetobacter baumannii NOT DETECTED NOT DETECTED Final   Enterobacteriaceae species NOT DETECTED NOT DETECTED Final   Enterobacter cloacae complex NOT DETECTED NOT DETECTED Final   Escherichia  coli NOT DETECTED NOT DETECTED Final   Klebsiella oxytoca NOT DETECTED NOT DETECTED Final   Klebsiella pneumoniae NOT DETECTED NOT DETECTED Final   Proteus species NOT DETECTED NOT DETECTED Final   Serratia marcescens NOT DETECTED NOT DETECTED Final   Haemophilus influenzae NOT DETECTED NOT DETECTED Final   Neisseria meningitidis NOT DETECTED NOT DETECTED Final   Pseudomonas aeruginosa NOT DETECTED NOT DETECTED Final   Candida albicans NOT DETECTED NOT DETECTED Final   Candida glabrata NOT DETECTED NOT DETECTED Final   Candida krusei NOT DETECTED NOT DETECTED Final   Candida parapsilosis NOT DETECTED NOT DETECTED Final   Candida tropicalis NOT DETECTED NOT DETECTED Final    Comment: Performed at [facility], [address] [place], [place] [address]  CULTURE, BLOOD (ROUTINE X 2) w Reflex to ID Panel     Status: None   Collection Time: 02/13/19  3:09 PM   Specimen: BLOOD  Result Value Ref Range Status   Specimen Description BLOOD BLOOD LEFT HAND  Final   Special Requests   Final    BOTTLES DRAWN AEROBIC AND ANAEROBIC Blood Culture adequate volume   Culture   Final    NO GROWTH 5 DAYS Performed at [facility], [address], [place], [place] [address]    Report Status 02/18/2019 FINAL  Final  Culture, blood (routine x 2)     Status: Abnormal   Collection Time: 02/15/19 12:49 PM   Specimen: BLOOD  Result Value Ref Range Status   Specimen Description   Final    BLOOD LT HAND Performed at [facility], [address] [place], [place] [address]    Special Requests   Final    BOTTLES DRAWN AEROBIC AND ANAEROBIC Blood Culture results may not be optimal due to an inadequate volume of blood received in culture bottles Performed at [facility], [address] [place], [place] [address]    Culture  Setup Time   Final    GRAM POSITIVE COCCI IN BOTH AEROBIC AND ANAEROBIC BOTTLES CRITICAL RESULT CALLED TO, READ BACK BY AND VERIFIED  WITHEleonore Chiquito AT [id number] 02/16/2019 SDR Performed at [facility], [address] [place], [place] [address]    Culture (A)  Final    METHICILLIN RESISTANT STAPHYLOCOCCUS AUREUS STAPHYLOCOCCUS SPECIES (COAGULASE NEGATIVE) THE SIGNIFICANCE OF ISOLATING THIS ORGANISM FROM A SINGLE SET OF BLOOD CULTURES WHEN MULTIPLE SETS ARE DRAWN IS UNCERTAIN. PLEASE NOTIFY THE MICROBIOLOGY DEPARTMENT WITHIN ONE WEEK IF SPECIATION AND SENSITIVITIES ARE REQUIRED. Performed at [facility], [place] [address] [place], [place] [address]    Report Status 02/18/2019 FINAL  Final   Organism ID, Bacteria METHICILLIN RESISTANT STAPHYLOCOCCUS AUREUS  Final      Susceptibility   Methicillin resistant staphylococcus aureus - MIC*    CIPROFLOXACIN >=8 RESISTANT Resistant     ERYTHROMYCIN >=8 RESISTANT Resistant     GENTAMICIN <=0.5 SENSITIVE Sensitive     OXACILLIN >=4 RESISTANT Resistant     TETRACYCLINE <=1 SENSITIVE Sensitive     VANCOMYCIN <=0.5 SENSITIVE Sensitive     TRIMETH/SULFA <=10 SENSITIVE Sensitive     CLINDAMYCIN RESISTANT Resistant     RIFAMPIN <=0.5 SENSITIVE Sensitive     Inducible Clindamycin POSITIVE Resistant     * METHICILLIN  RESISTANT STAPHYLOCOCCUS AUREUS  Aerobic Culture (superficial specimen)     Status: None   Collection Time: 02/17/19  1:31 PM   Specimen: Heel; Wound  Result Value Ref Range Status   Specimen Description   Final    HEEL Performed at [facility], [address] [place], [place] [address]    Special Requests   Final    NONE Performed at [facility], [address] [place], [place] [address]    Gram Stain   Final    NO WBC SEEN MODERATE GRAM POSITIVE COCCI IN PAIRS    Culture   Final    ABUNDANT METHICILLIN RESISTANT STAPHYLOCOCCUS AUREUS ABUNDANT DIPHTHEROIDS(CORYNEBACTERIUM SPECIES) Standardized susceptibility testing for this organism is not available. Performed at [facility], [place] [address] [place],  [place] [address]    Report Status 02/19/2019 FINAL  Final   Organism ID, Bacteria METHICILLIN RESISTANT STAPHYLOCOCCUS AUREUS  Final      Susceptibility   Methicillin resistant staphylococcus aureus - MIC*    CIPROFLOXACIN >=8 RESISTANT Resistant     ERYTHROMYCIN >=8 RESISTANT Resistant     GENTAMICIN <=0.5 SENSITIVE Sensitive     OXACILLIN >=4 RESISTANT Resistant     TETRACYCLINE <=1 SENSITIVE Sensitive     VANCOMYCIN 1 SENSITIVE Sensitive     TRIMETH/SULFA <=10 SENSITIVE Sensitive     CLINDAMYCIN RESISTANT Resistant     RIFAMPIN <=0.5 SENSITIVE Sensitive     Inducible Clindamycin POSITIVE Resistant     * ABUNDANT METHICILLIN RESISTANT STAPHYLOCOCCUS AUREUS  CULTURE, BLOOD (ROUTINE X 2) w Reflex to ID Panel     Status: None   Collection Time: 02/18/19 11:29 PM   Specimen: BLOOD  Result Value Ref Range Status   Specimen Description BLOOD LEFT ARM  Final   Special Requests   Final    BOTTLES DRAWN AEROBIC AND ANAEROBIC Blood Culture adequate volume   Culture   Final    NO GROWTH 5 DAYS Performed at [facility] [address] [place], [place] [address]    Report Status 02/23/2019 FINAL  Final  CULTURE, BLOOD (ROUTINE X 2) w Reflex to ID Panel     Status: None   Collection Time: 02/18/19 11:29 PM   Specimen: BLOOD  Result Value Ref Range Status   Specimen Description BLOOD LEFT ARM  Final   Special Requests   Final    BOTTLES DRAWN AEROBIC AND ANAEROBIC Blood Culture adequate volume   Culture   Final    NO GROWTH 5 DAYS Performed at [facility], [address] [place], [place] [address]    Report Status 02/23/2019 FINAL  Final    Coagulation Studies: No results for input(s): LABPROT, INR in the last 72 hours.  Urinalysis: No results for input(s): COLORURINE, LABSPEC, PHURINE, GLUCOSEU, HGBUR, BILIRUBINUR, KETONESUR, PROTEINUR, UROBILINOGEN, NITRITE, LEUKOCYTESUR in the last 72 hours.  Invalid input(s): APPERANCEUR    Imaging: PERIPHERAL VASCULAR  CATHETERIZATION  Result Date: 02/23/2019 See op note  DG [facility] Dys Tube [facility]c W/Fl W/Rad  Result Date: 02/24/2019 CLINICAL DATA:  NG tube replacement requested. EXAM: NASO G TUBE PLACEMENT WITH FL AND WITH RAD CONTRAST:  None FLUOROSCOPY TIME:  Fluoroscopy Time:  2 minutes 54 seconds Radiation Exposure Index (if provided by the fluoroscopic device): 60.0 mGy COMPARISON:  Chest x-ray 02/09/2019. FINDINGS: NG tube was placed under fluoroscopic guidance without complication. Tube tip and side hole placed in the stomach. NG tube was taped to the nose and the patient sent  back to the floor in good clinical condition. IMPRESSION: Successful fluoroscopically guided NG tube placement. Electronically Signed   By: Marcello Moores  Register   On: 02/24/2019 10:08     Medications:   . sodium chloride Stopped (02/20/19 0101)  . DAPTOmycin (CUBICIN)  IV Stopped (02/22/19 2235)  . feeding supplement (OSMOLITE 1.5 CAL)    . heparin 900 Units/hr (02/24/19 0400)   . sodium chloride   Intravenous Once  . aspirin  81 mg Oral Daily  . B-complex with vitamin C  1 tablet Per Tube Daily  . chlorhexidine  15 mL Mouth Rinse BID  . Chlorhexidine Gluconate Cloth  6 each Topical Q0600  . epoetin (EPOGEN/PROCRIT) injection  4,000 Units Intravenous Q M,W,F-HD  . feeding supplement (PRO-STAT SUGAR FREE 64)  30 mL Per Tube Daily  . fluticasone  2 spray Each Nare Daily  . free water  30 mL Per Tube Q4H  . insulin aspart  0-9 Units Subcutaneous TID WC  . levothyroxine  88 mcg Oral QAC breakfast  . mouth rinse  15 mL Mouth Rinse q12n4p  . midodrine  10 mg Oral TID WC  . pantoprazole (PROTONIX) IV  40 mg Intravenous Q24H  . venlafaxine  75 mg Oral BID WC  . zinc sulfate  220 mg Oral Daily   sodium chloride, haloperidol lactate, ondansetron (ZOFRAN) IV  Assessment/ Plan:  [name] is a 63 y.o. white female with end-stage renal disease on hemodialysis, atrial fibrillation, chronic diastolic congestive heart failure,  coronary artery disease status post CABG, diabetes mellitus type II, hypertension, hyperlipidemia, hypothyroidism, Covid positive in July 2020, recent nursing home stay in [place] Prolonged hospitalization for MRSA bacteremia/sepsis and now with acute encephalopathy  [facility] MWF [facility] [address] Left AVG 81.5kg  1. End Stage Renal Disease: on hemodialysis. With complication of dialysis device. Angiogram on 12/21 shows no communication with bleed to AVG.  Placed tunneled catheter until her AVG site has healed. - Dialysis for tomorrow. MWF schedule   2. Hypotension - midodrine  3. Anemia of chronic kidney disease: hemoglobin 6.8 - PRBC transfusion ordered - EPO with HD treatments.   4. Secondary Hyperparathyroidism: not currently on binders.   5. Acute Encephalopathy - Appreciate neurology input.    LOS: [place] 12/22/202012:15 PM

## 2019-02-24 NOTE — Progress Notes (Signed)
Nurse reports HGB 6.8 on am labs. Chart reviewed. Given palliative care measure initiation of late and continued decision making with husband, will defere transfusion decision to attending in conjunction with palliative care and husband

## 2019-02-24 NOTE — Consult Note (Signed)
ANTICOAGULATION CONSULT NOTE  Pharmacy Consult for heparin Indication: DVT   Patient Measurements: Height: 5\' 4"  (162.6 cm) Weight: 161 lb 13.1 oz (73.4 kg) IBW/kg (Calculated) : 54.7 Heparin Dosing Weight: 71 kg  Vital Signs: Temp: 98.4 F (36.9 C) (12/21 2009) Temp Source: Oral (12/21 2009) BP: 109/49 (12/21 2009) Pulse Rate: 72 (12/21 2009)  Labs: Recent Labs    02/21/19 1159 02/23/19 0539 02/23/19 1000 02/24/19 0241  HGB 7.7*  --   --  6.8*  HCT 24.3*  --   --  20.9*  PLT 227  --   --  153  HEPARINUNFRC  --   --  <0.10* 0.39  CREATININE 2.88* 4.21*  --   --   CKTOTAL  --  29*  --   --     Estimated Creatinine Clearance: 13.1 mL/min (A) (by C-G formula based on SCr of 4.21 mg/dL (H)).   Medical History: Past Medical History:  Diagnosis Date  . Anemia   . Chronic diastolic CHF (congestive heart failure) ([place])    a. Due to ischemic cardiomyopathy. EF as low as 35%, improved to normal s/p CABG; b. echo 07/06/13: EF 55-60%, no RWMAs, mod TR, trivial pericardial effusion not c/w tamponade physiology;  c. 10/2015 Echo: EF 65%, Gr1 DD, triv AI, mild MR, mildly dil LA, mod TR, PASP 75mmHg.  Marland Kitchen Coronary artery disease    a. NSTEMI 06/2013; b.cath: severe three-vessel CAD w/ EF 30% & mild-mod MR; c. s/p 3 vessel CABG 07/02/13 (LIMA-LAD, SVG-OM, and SVG-RPDA);  d. 10/2015 MV: no ischemia/infarct.  . Diabetes mellitus without complication ([place])   . Diabetic neuropathy ([place])   . Dialysis patient [facility])    MWF  . ESRD (end stage renal disease) ([place])    a. 12/2015 initiated - mwf dialysis.  Marland Kitchen GERD (gastroesophageal reflux disease)   . Hyperlipidemia   . Hypertension   . Hypothyroidism   . Myocardial infarction ([place]) 2015  . Neuropathy   . Pleural effusion 2015  . Pulmonary hypertension ([place])   . Renal insufficiency   . Wears dentures    full lower    Medications:  Scheduled:  . aspirin  81 mg Oral Daily  . B-complex with vitamin C  1 tablet Per Tube Daily  .  chlorhexidine  15 mL Mouth Rinse BID  . Chlorhexidine Gluconate Cloth  6 each Topical Q0600  . epoetin (EPOGEN/PROCRIT) injection  4,000 Units Intravenous Q M,W,F-HD  . feeding supplement (PRO-STAT SUGAR FREE 64)  30 mL Per Tube Daily  . fluticasone  2 spray Each Nare Daily  . free water  30 mL Per Tube Q4H  . insulin aspart  0-9 Units Subcutaneous TID WC  . levothyroxine  88 mcg Oral QAC breakfast  . mouth rinse  15 mL Mouth Rinse q12n4p  . midodrine  10 mg Oral TID WC  . pantoprazole (PROTONIX) IV  40 mg Intravenous Q24H  . venlafaxine  75 mg Oral BID WC  . zinc sulfate  220 mg Oral Daily    Assessment: 63 y.o. female admitted on 02/09/2019 with MRSA  bacteremia. She was on apixaban PTA but last dose was prior to admission (12/7). [place] 12/15 revealed age-indeterminate short segment occlusive DVT involving the mid humeral aspect of one of the paired brachial veins.  Heparin was started but after repeated attempts by multiple phlebotomists lab was unable to draw labs required for heparin monitoring. Additionally, she was too confused and disoriented to swallow oral medications. On 12/20 a  femoral central line was placed. She is having her AV fistula replaced today. She received a single dose of enoxaparin 12/18 and a heparin level this morning showed an untraceable level.  12/22 0241 HL 0.39 therapeutic x 1 - CBC worse  Goal of Therapy:  Monitor platelets by anticoagulation protocol: Yes   Plan:   Continue Heparin infusion at 900 units/hr  We will recheck an anti-Xa level in 8 hours to confirm   CBC in am   Ena Dawley, PharmD Clinical Pharmacist 02/24/2019 3:16 AM

## 2019-02-24 NOTE — Consult Note (Signed)
ANTICOAGULATION CONSULT NOTE  Pharmacy Consult for heparin Indication: DVT   Patient Measurements: Height: 5\' 4"  (162.6 cm) Weight: 161 lb 13.1 oz (73.4 kg) IBW/kg (Calculated) : 54.7 Heparin Dosing Weight: 71 kg  Vital Signs: Temp: 97.6 F (36.4 C) (12/22 0610) Temp Source: Oral (12/22 0610) BP: 124/48 (12/22 0610) Pulse Rate: 60 (12/22 0610)  Labs: Recent Labs    02/21/19 1159 02/23/19 0539 02/23/19 1000 02/24/19 0241  HGB 7.7*  --   --  6.8*  HCT 24.3*  --   --  20.9*  PLT 227  --   --  153  HEPARINUNFRC  --   --  <0.10* 0.39  CREATININE 2.88* 4.21*  --  2.45*  CKTOTAL  --  29*  --   --     Estimated Creatinine Clearance: 22.5 mL/min (A) (by C-G formula based on SCr of 2.45 mg/dL (H)).   Medical History: Past Medical History:  Diagnosis Date  . Anemia   . Chronic diastolic CHF (congestive heart failure) ([place])    a. Due to ischemic cardiomyopathy. EF as low as 35%, improved to normal s/p CABG; b. echo 07/06/13: EF 55-60%, no RWMAs, mod TR, trivial pericardial effusion not c/w tamponade physiology;  c. 10/2015 Echo: EF 65%, Gr1 DD, triv AI, mild MR, mildly dil LA, mod TR, PASP 17mmHg.  Marland Kitchen Coronary artery disease    a. NSTEMI 06/2013; b.cath: severe three-vessel CAD w/ EF 30% & mild-mod MR; c. s/p 3 vessel CABG 07/02/13 (LIMA-LAD, SVG-OM, and SVG-RPDA);  d. 10/2015 MV: no ischemia/infarct.  . Diabetes mellitus without complication ([place])   . Diabetic neuropathy ([place])   . Dialysis patient [facility])    MWF  . ESRD (end stage renal disease) ([place])    a. 12/2015 initiated - mwf dialysis.  Marland Kitchen GERD (gastroesophageal reflux disease)   . Hyperlipidemia   . Hypertension   . Hypothyroidism   . Myocardial infarction ([place]) 2015  . Neuropathy   . Pleural effusion 2015  . Pulmonary hypertension ([place])   . Renal insufficiency   . Wears dentures    full lower    Medications:  Scheduled:  . aspirin  81 mg Oral Daily  . B-complex with vitamin C  1 tablet Per Tube Daily  .  chlorhexidine  15 mL Mouth Rinse BID  . Chlorhexidine Gluconate Cloth  6 each Topical Q0600  . epoetin (EPOGEN/PROCRIT) injection  4,000 Units Intravenous Q M,W,F-HD  . feeding supplement (PRO-STAT SUGAR FREE 64)  30 mL Per Tube Daily  . fluticasone  2 spray Each Nare Daily  . free water  30 mL Per Tube Q4H  . insulin aspart  0-9 Units Subcutaneous TID WC  . levothyroxine  88 mcg Oral QAC breakfast  . mouth rinse  15 mL Mouth Rinse q12n4p  . midodrine  10 mg Oral TID WC  . pantoprazole (PROTONIX) IV  40 mg Intravenous Q24H  . venlafaxine  75 mg Oral BID WC  . zinc sulfate  220 mg Oral Daily    Assessment: 63 y.o. female admitted on 02/09/2019 with MRSA  bacteremia. She was on apixaban PTA but last dose was prior to admission (12/7). [place] 12/15 revealed age-indeterminate short segment occlusive DVT involving the mid humeral aspect of one of the paired brachial veins.  Heparin was started but after repeated attempts by multiple phlebotomists lab was unable to draw labs required for heparin monitoring. Additionally, she was too confused and disoriented to swallow oral medications. On 12/20 a femoral central  line was placed. She is having her AV fistula replaced today. She received a single dose of enoxaparin 12/18 and a heparin level this morning showed an untraceable level. Hgb is down to 6.8 this morning. Dr Priscella Mann is aware and has ordered a unit of PRBCs  Heparin Course: 12/21 initiation: 900 units/hr w/o bolus 12/22 0241 HL 0.39 therapeutic x 1 12/22 1250 HL 0.63: dec rate to 800 units/hr  Goal of Therapy:  Heparin Level: 0.3 - 0.7 units/mL Monitor platelets by anticoagulation protocol: Yes   Plan:   While the level is therapeutic there is a significant increase since the previous draw   Decrease heparin infusion to 800 units/hr  recheck an anti-Xa level in 8 hours after rate change  CBC in am   Dallie Piles, PharmD Clinical Pharmacist 02/24/2019 7:19 AM

## 2019-02-24 NOTE — Progress Notes (Signed)
Date of Admission:  02/09/2019     ID: Jamie Arias is a 63 y.o. female Principal Problem:   Sepsis due to methicillin resistant Staphylococcus aureus (MRSA) ([place]) Active Problems:   Diabetes mellitus, type 2 ([facility])   Hypotension   Chronic diastolic CHF (congestive heart failure) ([facility])   ESRD (end stage renal disease) ([place])   Altered level of consciousness   Pressure injury of skin   MRSA bacteremia   Arm DVT (deep venous thromboembolism), acute, left ([facility])   Acute encephalopathy    Subjective: Non verbal  Medications:  . aspirin  81 mg Oral Daily  . B-complex with vitamin C  1 tablet Per Tube Daily  . chlorhexidine  15 mL Mouth Rinse BID  . Chlorhexidine Gluconate Cloth  6 each Topical Q0600  . epoetin (EPOGEN/PROCRIT) injection  4,000 Units Intravenous Q M,W,F-HD  . feeding supplement (PRO-STAT SUGAR FREE 64)  30 mL Per Tube Daily  . fluticasone  2 spray Each Nare Daily  . free water  30 mL Per Tube Q4H  . insulin aspart  0-9 Units Subcutaneous TID WC  . levothyroxine  88 mcg Oral QAC breakfast  . mouth rinse  15 mL Mouth Rinse q12n4p  . midodrine  10 mg Oral TID WC  . pantoprazole (PROTONIX) IV  40 mg Intravenous Q24H  . venlafaxine  75 mg Oral BID WC  . zinc sulfate  220 mg Oral Daily    Objective: Vital signs in last 24 hours: Temp:  [97.6 F (36.4 C)-98.4 F (36.9 C)] 97.9 F (36.6 C) (12/22 1728) Pulse Rate:  [60-72] 67 (12/22 1728) Resp:  [15-18] 15 (12/22 1728) BP: (109-149)/(45-49) 122/45 (12/22 1728) SpO2:  [98 %-100 %] 98 % (12/22 1728)  PHYSICAL EXAM:  General:drowsy, on calling her name opened her eyes and said what, but no other response Head: Normocephalic, without obvious abnormality, atraumatic. Eyes: Conjunctivae clear, anicteric sclerae. Pupils are equal ENT NG tube in place Neck: Supple,  Left IJ.permacath Left femoral vein triple lumen placed on 02/21/19 Lungs: b/l air entry- decreased in bases Heart:s1s2 Abdomen: Soft,   Extremities: bruising both arms Neurological- does not move limbs spontaneously Lab Results Recent Labs    02/23/19 0539 02/24/19 0241  WBC  --  5.7  HGB  --  6.8*  HCT  --  20.9*  NA 143 140  K 2.9* 3.3*  CL 106 104  CO2 21* 25  BUN 17 9  CREATININE 4.21* 2.45*   Liver Panel No results for input(s): PROT, ALBUMIN, AST, ALT, ALKPHOS, BILITOT, BILIDIR, IBILI in the last 72 hours. Sedimentation Rate No results for input(s): ESRSEDRATE in the last 72 hours. C-Reactive Protein No results for input(s): CRP in the last 72 hours.  Microbiology:  Studies/Results: PERIPHERAL VASCULAR CATHETERIZATION  Result Date: 02/23/2019 See op note  DG [place] Dys Tube Plc W/Fl W/Rad  Result Date: 02/24/2019 CLINICAL DATA:  NG tube replacement requested. EXAM: NASO G TUBE PLACEMENT WITH FL AND WITH RAD CONTRAST:  None FLUOROSCOPY TIME:  Fluoroscopy Time:  2 minutes 54 seconds Radiation Exposure Index (if provided by the fluoroscopic device): 60.0 mGy COMPARISON:  Chest x-ray 02/09/2019. FINDINGS: NG tube was placed under fluoroscopic guidance without complication. Tube tip and side hole placed in the stomach. NG tube was taped to the nose and the patient sent back to the floor in good clinical condition. IMPRESSION: Successful fluoroscopically guided NG tube placement. Electronically Signed   By: Marcello Moores  Register   On: 02/24/2019  10:08     Assessment/Plan: MRSA bacteremia- after being persistently bacteremic for a week- since 12/16 blood culture neg- on ceftaroline and dapto- I still am concerned the AV fistula with stents is the source and may have persistent infection ( spine imaging Ok, TEE could not be done)- the AV fistula was cannulated on 02/23/19 because of a malfunctioning rt brachial artery.  Persistent encephalopathy- unclear etiology- her mental status has fluctuated with her talking on some days  But of late none She also has not had any nutrition in many days Could that be a  reason? Could she have autoimmune encephalitis like Lga1 ( as she had  tremors on her jaw and rt hand)- less likely Neurologist has seen her in the past- They do not think LP would add any info.   Paroxsymal Afib- now in Sinus rhythm  Left arm DVT- on lovenox Heparin infusion not possible due to continuous oozing  Post covid morbidity- Long COVID  Multiple comorbidities and minimal chance of a meaningful recovery. Palliative on board

## 2019-02-24 NOTE — Progress Notes (Signed)
Jamie Arias  PROGRESS NOTE    Jamie Arias  [id number] DOB: 1955/04/07 DOA: 02/09/2019 PCP: [name], MD    Brief Narrative:  63 year old female with end-stage renal disease on hemodialysis, admitted to the hospital with severe septic shock second encephalopathic and is unable to consent to the procedure ary to MRSA bacteremia. She had associated metabolic encephalopathy. She is being treated with intravenous vancomycin. She is not requiring any further pressors. Encephalopathy has been slow to improve.  Bacteremia has been persistent without any clear etiology.  CT spine unrevealing for any obvious infectious focus.  Infectious disease following.  Cardiology following.  Plan for inpatient transesophageal echocardiogram currently delayed as patient persistently encephalopathic and is unable to consent for the procedure  12/17: Remains encephalopathic.  Does not provide any reliable history.  Perseverates on the name Jamie Arias.  12/18: Encephalopathy appears to be worsening.  Patient not eating.  Opens eyes but does not provide any meaningful history.  Off the list for TEE currently given encephalopathy.  Seen by neurology yesterday, discussed with Dr. [name], no acute neurologic findings and encephalopathy most likely related to underlying sepsis   12/19: patient's encephalopathy appears to be worsening.  She is not taking anything by mouth.  She is unable to swallow pills.  She has lost all IV access at this point. ICU contacted who agreed to place femoral central line on the floor.  Husband [name] at bedside.  Elected to change CODE STATUS to DO NOT RESUSCITATE  12/20: Patient remains encephalopathic.  Still not taking anything additional.  Femoral central line in place.  Had a brief moment this morning where she did respond to her name did not provide any other meaningful history.  12/21: Patient mains encephalopathic.  Not taking pAriaso. meds.  Not eating.  Femoral central line remains in  place.  Opens her eyes when she hears her name but does not provide any reliable history.  12/22: Patient remains encephalopathic.  Continues not to take pAriaso. meds or eat.  At the bleeding from AV graft stitch was placed by vascular and permacath placed in the operating room.  Teflaro stopped yesterday due to concerns about contribution to encephalopathy.  NG tube placed in interventional radiology this morning after 2 failed attempts on the floor.  Husband at bedside   Assessment & Plan:   Principal Problem:   Sepsis due to methicillin resistant Staphylococcus aureus (MRSA) ([name]) Active Problems:   Diabetes mellitus, type 2 (HCC)   Hypotension   Chronic diastolic CHF (congestive heart failure) (HCC)   ESRD (end stage renal disease) (HCC)   Altered level of consciousness   Pressure injury of skin   MRSA bacteremia   Arm DVT (deep venous thromboembolism), acute, left (HCC)   Acute encephalopathy   1. Septic shock secondary to MRSA bacteremia.  Shock resolved Source control not achieved Patient remains persistently bacteremic Source of bacteremia is unclear TTE unrevealing CT spine unrevealing for obvious infectious focus TEE planned however patient has been too encephalopathic and cannot consent to the procedure and presents a high risk for anesthesia Staph aureus isolated from heel wound Seen by podiatry, heel wound unlikely source of bacteremia Repeat cultures on 02/18/2019 no growth to date 12/21: Spoke with infectious disease and nephrology.  Possibility that ceftaroline may be contributing to encephalopathy. Plan: Continue daptomycin monotherapy Follow blood cultures from 02/18/2019-no growth to date Continue to monitor mental status and neurologic recovery TEE on hold, unlikely that this will change management ID following  2. End-stage renal disease on hemodialysis.  Nephrology following for dialysis needs. Patient has very poor vascular access She has lost all of  her peripheral IVs She has a left-sided femoral central venous catheter in place She now has a left chest permacath   3. Acute encephalopathy.  Possible sepsis related MRI of the brain did not show any acute infarct.  EEG did not show any evidence of seizures.  She was noted to have twitching in her lower jaw, but her husband reports this is been present for the past several months.   Ammonia level is normal.   ABG does not show significant CO2 retention. -Continue antibiotic regimen for treatment of MRSA bacteremia -Continue to monitor mental status closely, frequent reorientation measures -Neurology consult: 02/19/2019.  Discussed with Dr. [name].  Recommendations appreciated.  Per neurology lumbar puncture is unlikely to add diagnostic value.  Remainder of neurologic work-up essentially unrevealing.  Persistent encephalopathy likely related to underlying bacteremia and sepsis  -Encephalopathy appears to be worsening despite apparent clearance of bacteremia.  Unknown etiology but likely multifactorial.    12/21: Per last palliative care note they will meet with the family again today.  At this point unfortunately patient has demonstrated very little neurologic recovery.  Would recommend strong consideration for hospice/comfort measures.  Patient is not eating, though I feel that placement of an enteral feeding tube (NGT or PEG) would not improve her quality of life, and would increase the likelihood of a dangerous aspiration event.  12/22: Patient had NG tube placed in interventional radiology this morning due to multiple failed attempts on the floor.  Husband [name] is at bedside.  Had a lengthy conversation with him regarding her overall prognosis.  Indicated that the NG tube and trial of tube feeds were likely a last effort to assess whether her encephalopathy will improve.  I explained that should tube feeds not help in recovery of her mentation that we are likely out of viable options and  that he should strongly consider hospice/comfort measures.  Palliative care team is following.  We will continue to monitor carefully.  4. Acute left arm DVT.  Noted on venous Dopplers.  Started on heparin infusion, but this was held due to oozing around central line.   Continue empiric anticoagulation And consider oral anticoagulation if patient is able to discharge Currently patient is on 3 times a week Lovenox, pharmacy following for anticoagulation monitoring Now that she has reliable IV access, we will switch back to heparin GTT for now  5. History of atrial fibrillation.  She was taking amiodarone and Eliquis prior to admission.  Currently in sinus rhythm.  Continue to follow heart rate.  On LMWH for left upper extremity DVT  6. Chronic diastolic congestive heart failure.  Appears to have significant edema. Likely multifactorial, cardiogenic versus hypoalbuminemia  Can hopefully remove volume with dialysis.  7. Diabetes.  Continue on sliding scale insulin.  Blood sugars are currently stable.  8. Acute on chronic anemia.  Related to blood loss from oozing around IV sites.  Improved after 1 unit prbc on 12/13  9. Goals of care.   With patient's multiple medical issues, poor functional nutritional status, her prognosis is poor.   Spoke with husband Doctor, general practice at [facility] to change CODE STATUS to DO NOT RESUSCITATE Palliative care team following Nasogastric tube now in place We will initiate tube feeds however suspect that this will not result in recovery of her mental status Encouraged the husband to strongly consider  hospice/palliative care measures at this point   DVT prophylaxis: Heparin GTT Code Status: Full code Family Communication: spoke with husband [name] at bedside 12/22 Disposition Plan: Pending clinical improvement  Consultants:   ID  Nephrology  Cardiology  Neurology  Procedures:   Right femoral CVC 12/8> removed  Right femoral art line 12/8>  removed  Left chest permacath 12/21  Antimicrobials:   Vancomycin 12/7>12/14 ceftaroline 12/14>12/21  Daptomycin 12/14>    Subjective: Patient seen and examined Husband [name] at bedside Patient encephalopathic, did not provide any more reliable history Not eating NGT in place   Objective: Vitals:   02/23/19 1710 02/23/19 2009 02/24/19 0610 02/24/19 1147  BP: (!) 89/37 (!) 109/49 (!) 124/48 (!) 149/46  Pulse: 76 72 60 68  Resp: 14 16 16 18   Temp: (!) 97Arias3 F (36Arias3 C) 98Arias4 F (36Arias9 C) 97Arias6 F (36Arias4 C) 97Arias8 F (36Arias6 C)  TempSrc: Axillary Oral Oral Oral  SpO2: 100% 100% 98% 99%  Weight:      Height:        Intake/Output Summary (Last 24 hours) at 02/24/2019 1252 Last data filed at 02/24/2019 0400 Gross per 24 hour  Intake 80Arias82 ml  Output --  Net 80Arias82 ml   Filed Weights   02/20/19 1155 02/20/19 1545 02/23/19 1333  Weight: 74Arias9 kg 73Arias4 kg 73Arias4 kg    Examination:  General exam: somnolent, but wakes up to voice Respiratory system: diminished breath sounds bilaterally. Respiratory effort normal. Cardiovascular system:RRR. No murmurs, rubs, gallops. Gastrointestinal system: Abdomen is nondistended, soft and nontender. No organomegaly or masses felt. Normal bowel sounds heard. Central nervous system: No focal neurological deficits. Extremities: 1-2+ edema bilaterally Skin: No rashes, lesions or ulcers Psychiatry: somnolent.   Data Reviewed: I have personally reviewed following labs and imaging studies  CBC: Recent Labs  Lab 02/18/19 0215 02/19/19 2119 02/21/19 1159 02/24/19 0241  WBC 10Arias6* 15Arias9* 9Arias6 5Arias7  HGB 9Arias4* 8Arias7* 7Arias7* 6Arias8*  HCT 28Arias4* 25Arias6* 24Arias3* 20Arias9*  MCV 84Arias3 83Arias1 88Arias4 87Arias1  PLT 177 218 227 0000000   Basic Metabolic Panel: Recent Labs  Lab 02/18/19 0215 02/18/19 1200 02/19/19 2119 02/21/19 1159 02/22/19 0531 02/23/19 0539 02/24/19 0241  NA 137  --  142 141  --  143 140  K 4Arias0  --  3Arias0* 3Arias2* 3Arias1* 2Arias9* 3Arias3*  CL 100  --  100 101  --  106  104  CO2 23  --  28 21*  --  21* 25  GLUCOSE 128*  --  119* 184*  --  197* 188*  BUN 18  --  11 10  --  17 9  CREATININE 3Arias95*  --  3Arias29* 2Arias88*  --  4Arias21* 2Arias45*  CALCIUM 8Arias0*  --  8Arias4* 8Arias5*  --  8Arias0* 8Arias0*  MG 1Arias7  --  1Arias9 1Arias7  --  1Arias8 2Arias0  PHOS 2Arias7 3Arias0 2Arias9  --   --  3Arias7 2Arias8   GFR: Estimated Creatinine Clearance: 22Arias5 mL/min (A) (by C-G formula based on SCr of 2Arias45 mg/dL (H)). Liver Function Tests: Recent Labs  Lab 02/19/19 2119  ALBUMIN 2Arias2*   No results for input(s): LIPASE, AMYLASE in the last 168 hours. Recent Labs  Lab 02/18/19 1334 02/19/19 2119  AMMONIA 10 12   Coagulation Profile: Recent Labs  Lab 02/17/19 2011  INR 1Arias3*   Cardiac Enzymes: Recent Labs  Lab 02/23/19 0539  CKTOTAL 29*   BNP (last 3 results) No results for input(s): PROBNP in the last  8760 hours. HbA1C: No results for input(s): HGBA1C in the last 72 hours. CBG: Recent Labs  Lab 02/23/19 1549 02/23/19 1706 02/23/19 2133 02/24/19 0748 02/24/19 1146  GLUCAP 95 106* 148* 160* 174*   Lipid Profile: No results for input(s): CHOL, HDL, LDLCALC, TRIG, CHOLHDL, LDLDIRECT in the last 72 hours. Thyroid Function Tests: No results for input(s): TSH, T4TOTAL, FREET4, T3FREE, THYROIDAB in the last 72 hours. Anemia Panel: No results for input(s): VITAMINB12, FOLATE, FERRITIN, TIBC, IRON, RETICCTPCT in the last 72 hours. Sepsis Labs: No results for input(s): PROCALCITON, LATICACIDVEN in the last 168 hours.  Recent Results (from the past 240 hour(s))  Culture, blood (routine x 2)     Status: Abnormal   Collection Time: 02/15/19 12:49 PM   Specimen: BLOOD  Result Value Ref Range Status   Specimen Description   Final    BLOOD LT HAND Performed at [facility], [address] [place], [place] [address]    Special Requests   Final    BOTTLES DRAWN AEROBIC AND ANAEROBIC Blood Culture results may not be optimal due to an inadequate volume of blood received in culture bottles Performed at  [facility], [address] [place], [place] [address]    Culture  Setup Time   Final    GRAM POSITIVE COCCI IN BOTH AEROBIC AND ANAEROBIC BOTTLES CRITICAL RESULT CALLED TO, READ BACK BY AND VERIFIED WITHEleonore Chiquito AT [id number] 02/16/2019 [place] Performed at [facility], [address] [place], [place] [address]    Culture (A)  Final    METHICILLIN RESISTANT STAPHYLOCOCCUS AUREUS STAPHYLOCOCCUS SPECIES (COAGULASE NEGATIVE) THE SIGNIFICANCE OF ISOLATING THIS ORGANISM FROM A SINGLE SET OF BLOOD CULTURES WHEN MULTIPLE SETS ARE DRAWN IS UNCERTAIN. PLEASE NOTIFY THE MICROBIOLOGY DEPARTMENT WITHIN ONE WEEK IF SPECIATION AND SENSITIVITIES ARE REQUIRED. Performed at [facility], [place] [address], [place], [place] [address]    Report Status 02/18/2019 FINAL  Final   Organism ID, Bacteria METHICILLIN RESISTANT STAPHYLOCOCCUS AUREUS  Final      Susceptibility   Methicillin resistant staphylococcus aureus - MIC*    CIPROFLOXACIN >=8 RESISTANT Resistant     ERYTHROMYCIN >=8 RESISTANT Resistant     GENTAMICIN <=0Arias5 SENSITIVE Sensitive     OXACILLIN >=4 RESISTANT Resistant     TETRACYCLINE <=1 SENSITIVE Sensitive     VANCOMYCIN <=0Arias5 SENSITIVE Sensitive     TRIMETH/SULFA <=10 SENSITIVE Sensitive     CLINDAMYCIN RESISTANT Resistant     RIFAMPIN <=0Arias5 SENSITIVE Sensitive     Inducible Clindamycin POSITIVE Resistant     * METHICILLIN RESISTANT STAPHYLOCOCCUS AUREUS  Aerobic Culture (superficial specimen)     Status: None   Collection Time: 02/17/19  1:31 PM   Specimen: Heel; Wound  Result Value Ref Range Status   Specimen Description   Final    HEEL Performed at [facility], [address] [place], [place] [address]    Special Requests   Final    NONE Performed at [facility], [address] [place], [place] [address]    Gram Stain   Final    NO WBC SEEN MODERATE GRAM POSITIVE COCCI IN PAIRS    Culture   Final    ABUNDANT METHICILLIN  RESISTANT STAPHYLOCOCCUS AUREUS ABUNDANT DIPHTHEROIDS(CORYNEBACTERIUM SPECIES) Standardized susceptibility testing for this organism is not available. Performed at [facility], [place] [address] [place], [place] [address]    Report Status 02/19/2019 FINAL  Final   Organism ID, Bacteria METHICILLIN RESISTANT  STAPHYLOCOCCUS AUREUS  Final      Susceptibility   Methicillin resistant staphylococcus aureus - MIC*    CIPROFLOXACIN >=8 RESISTANT Resistant     ERYTHROMYCIN >=8 RESISTANT Resistant     GENTAMICIN <=0Arias5 SENSITIVE Sensitive     OXACILLIN >=4 RESISTANT Resistant     TETRACYCLINE <=1 SENSITIVE Sensitive     VANCOMYCIN 1 SENSITIVE Sensitive     TRIMETH/SULFA <=10 SENSITIVE Sensitive     CLINDAMYCIN RESISTANT Resistant     RIFAMPIN <=0Arias5 SENSITIVE Sensitive     Inducible Clindamycin POSITIVE Resistant     * ABUNDANT METHICILLIN RESISTANT STAPHYLOCOCCUS AUREUS  CULTURE, BLOOD (ROUTINE X 2) w Reflex to ID Panel     Status: None   Collection Time: 02/18/19 11:29 PM   Specimen: BLOOD  Result Value Ref Range Status   Specimen Description BLOOD LEFT ARM  Final   Special Requests   Final    BOTTLES DRAWN AEROBIC AND ANAEROBIC Blood Culture adequate volume   Culture   Final    NO GROWTH 5 DAYS Performed at [facility], [address] [place], [place] [address]    Report Status 02/23/2019 FINAL  Final  CULTURE, BLOOD (ROUTINE X 2) w Reflex to ID Panel     Status: None   Collection Time: 02/18/19 11:29 PM   Specimen: BLOOD  Result Value Ref Range Status   Specimen Description BLOOD LEFT ARM  Final   Special Requests   Final    BOTTLES DRAWN AEROBIC AND ANAEROBIC Blood Culture adequate volume   Culture   Final    NO GROWTH 5 DAYS Performed at [facility], [address], [place], [place] [address]    Report Status 02/23/2019 FINAL  Final         Radiology Studies: PERIPHERAL VASCULAR CATHETERIZATION  Result Date: 02/23/2019 See op note  DG Loyce Dys Tube Plc W/Fl W/Rad  Result Date: 02/24/2019 CLINICAL DATA:  NG tube replacement requested. EXAM: NASO G TUBE PLACEMENT WITH FL AND WITH RAD CONTRAST:  None FLUOROSCOPY TIME:  Fluoroscopy Time:  2 minutes 54 seconds Radiation Exposure Index (if provided by the fluoroscopic device): 60Arias0 mGy COMPARISON:  Chest x-ray 02/09/2019. FINDINGS: NG tube was placed under fluoroscopic guidance without complication. Tube tip and side hole placed in the stomach. NG tube was taped to the nose and the patient sent back to the floor in good clinical condition. IMPRESSION: Successful fluoroscopically guided NG tube placement. Electronically Signed   By: Bladen   On: 02/24/2019 10:08        Scheduled Meds: . sodium chloride   Intravenous Once  . aspirin  81 mg Oral Daily  . B-complex with vitamin C  1 tablet Per Tube Daily  . chlorhexidine  15 mL Mouth Rinse BID  . Chlorhexidine Gluconate Cloth  6 each Topical Q0600  . epoetin (EPOGEN/PROCRIT) injection  4,000 Units Intravenous Q M,W,F-HD  . feeding supplement (PRO-STAT SUGAR FREE 64)  30 mL Per Tube Daily  . fluticasone  2 spray Each Nare Daily  . free water  30 mL Per Tube Q4H  . insulin aspart  0-9 Units Subcutaneous TID WC  . levothyroxine  88 mcg Oral QAC breakfast  . mouth rinse  15 mL Mouth Rinse q12n4p  . midodrine  10 mg Oral TID WC  . pantoprazole (PROTONIX) IV  40 mg Intravenous Q24H  . venlafaxine  75 mg Oral BID WC  . zinc sulfate  220 mg Oral Daily  Continuous Infusions: . sodium chloride Stopped (02/20/19 0101)  . DAPTOmycin (CUBICIN)  IV Stopped (02/22/19 2235)  . feeding supplement (OSMOLITE 1Arias5 CAL)    . heparin 900 Units/hr (02/24/19 0400)  . potassium chloride       LOS: 15 days    Time spent: 49mins    Sidney Ace, MD [facility] Pager: [phone]  If 7PM-7AM, please contact night-coverage [email or web address]  02/24/2019, 12:52 PM

## 2019-02-25 DIAGNOSIS — L8995 Pressure ulcer of unspecified site, unstageable: Secondary | ICD-10-CM

## 2019-02-25 LAB — CBC
HCT: 28 % — ABNORMAL LOW (ref 36.0–46.0)
HCT: 28 % — ABNORMAL LOW (ref 36.0–46.0)
Hemoglobin: 9.3 g/dL — ABNORMAL LOW (ref 12.0–15.0)
Hemoglobin: 9 g/dL — ABNORMAL LOW (ref 12.0–15.0)
MCH: 29.1 pg (ref 26.0–34.0)
MCH: 28.6 pg (ref 26.0–34.0)
MCHC: 33.2 g/dL (ref 30.0–36.0)
MCHC: 32.1 g/dL (ref 30.0–36.0)
MCV: 87.5 fL (ref 80.0–100.0)
MCV: 88.9 fL (ref 80.0–100.0)
Platelets: 166 10*3/uL (ref 150–400)
Platelets: 171 10*3/uL (ref 150–400)
RBC: 3.2 MIL/uL — ABNORMAL LOW (ref 3.87–5.11)
RBC: 3.15 MIL/uL — ABNORMAL LOW (ref 3.87–5.11)
RDW: 19.3 % — ABNORMAL HIGH (ref 11.5–15.5)
RDW: 19.6 % — ABNORMAL HIGH (ref 11.5–15.5)
WBC: 7.1 10*3/uL (ref 4.0–10.5)
WBC: 5.7 10*3/uL (ref 4.0–10.5)
nRBC: 0 % (ref 0.0–0.2)
nRBC: 0 % (ref 0.0–0.2)

## 2019-02-25 LAB — BASIC METABOLIC PANEL
Anion gap: 14 (ref 5–15)
BUN: 16 mg/dL (ref 8–23)
CO2: 21 mmol/L — ABNORMAL LOW (ref 22–32)
Calcium: 8.1 mg/dL — ABNORMAL LOW (ref 8.9–10.3)
Chloride: 105 mmol/L (ref 98–111)
Creatinine, Ser: 3.35 mg/dL — ABNORMAL HIGH (ref 0.44–1.00)
GFR calc Af Amer: 16 mL/min — ABNORMAL LOW (ref >60)
GFR calc non Af Amer: 14 mL/min — ABNORMAL LOW (ref >60)
Glucose, Bld: 194 mg/dL — ABNORMAL HIGH (ref 70–99)
Potassium: 3.5 mmol/L (ref 3.5–5.1)
Sodium: 140 mmol/L (ref 135–145)

## 2019-02-25 LAB — BPAM RBC
Blood Product Expiration Date: 202101162359
ISSUE DATE / TIME: 202012221738
Unit Type and Rh: 6200

## 2019-02-25 LAB — TYPE AND SCREEN
ABO/RH(D): A POS
Antibody Screen: NEGATIVE
Unit division: 0

## 2019-02-25 LAB — HEPARIN LEVEL (UNFRACTIONATED)
Heparin Unfractionated: 0.57 IU/mL (ref 0.30–0.70)
Heparin Unfractionated: 0.48 IU/mL (ref 0.30–0.70)

## 2019-02-25 LAB — METHYLMALONIC ACID, SERUM: Methylmalonic Acid, Quantitative: 302 nmol/L (ref 0–378)

## 2019-02-25 LAB — GLUCOSE, CAPILLARY
Glucose-Capillary: 165 mg/dL — ABNORMAL HIGH (ref 70–99)
Glucose-Capillary: 86 mg/dL (ref 70–99)
Glucose-Capillary: 117 mg/dL — ABNORMAL HIGH (ref 70–99)
Glucose-Capillary: 170 mg/dL — ABNORMAL HIGH (ref 70–99)

## 2019-02-25 LAB — MAGNESIUM: Magnesium: 2.1 mg/dL (ref 1.7–2.4)

## 2019-02-25 LAB — PHOSPHORUS: Phosphorus: 3.5 mg/dL (ref 2.5–4.6)

## 2019-02-25 LAB — VITAMIN B12: Vitamin B-12: 461 pg/mL (ref 180–914)

## 2019-02-25 MED ORDER — POTASSIUM CHLORIDE 10 MEQ/100ML IV SOLN
10.0000 meq | INTRAVENOUS | Status: AC
  Administered 2019-02-25 (×2): 10 meq via INTRAVENOUS
  Filled 2019-02-25 (×3): qty 100

## 2019-02-25 NOTE — Plan of Care (Signed)
PMT note: Patient is off floor in dialysis.

## 2019-02-25 NOTE — Progress Notes (Signed)
HD Tx started w/o complication    [id number]  Vital Signs  Pulse Rate 69  Pulse Rate Source Monitor  Resp 18  BP 134/60  BP Location Left Leg  BP Method Automatic  Patient Position (if appropriate) Lying  Oxygen Therapy  SpO2 100 %  O2 Device Room Air  Pulse Oximetry Type Continuous  During Hemodialysis Assessment  Blood Flow Rate (mL/min) 400 mL/min  Arterial Pressure (mmHg) -170 mmHg  Venous Pressure (mmHg) 120 mmHg  Transmembrane Pressure (mmHg) 50 mmHg  Ultrafiltration Rate (mL/min) 670 mL/min  Dialysate Flow Rate (mL/min) 600 ml/min  Conductivity: Machine  14.1  HD Safety Checks Performed Yes  Dialysis Fluid Bolus Normal Saline  Bolus Amount (mL) 250 mL  Intra-Hemodialysis Comments Tx initiated  Fistula / Graft Right Upper arm Arteriovenous vein graft  Placement Date/Time: 02/03/16 1210   Placed prior to admission: No  Orientation: Right  Access Location: Upper arm  Access Type: Arteriovenous vein graft  Site Condition No complications  Fistula / Graft Assessment Present;Thrill;Bruit  Hemodialysis Catheter Left Internal jugular Double lumen Permanent (Tunneled)  Placement Date/Time: 02/23/19 1514   Time Out: Correct patient;Correct site;Correct procedure  Maximum sterile barrier precautions: Hand hygiene;Cap;Mask;Sterile gown;Sterile gloves;Large sterile sheet  Site Prep: Chlorhexidine (preferred)  Local Anes...  Site Condition No complications  Blue Lumen Status Blood return noted  Red Lumen Status Blood return noted  Dressing Type Occlusive;Biopatch  Dressing Status Clean;Dry;Intact

## 2019-02-25 NOTE — Consult Note (Signed)
Pharmacy Antibiotic Note  Jamie Arias is a 63 y.o. female admitted on 02/09/2019 with methicillin resistant Staphylococcus aureus  .  Pharmacy has been consulted for daptomycin dosing. This is day # 10 of IV antibioics, leukocytosis has resolved with no recent fevers.  Plan: Continue daptomycin 650 mg IV every 48 hours  Height: 5\' 4"  (162.6 cm) Weight: 161 lb 13.1 oz (73.4 kg) IBW/kg (Calculated) : 54.7  Temp (24hrs), Avg:98 F (36.7 C), Min:97.8 F (36.6 C), Max:98.2 F (36.8 C)  Recent Labs  Lab 02/19/19 2119 02/21/19 1159 02/23/19 0539 02/24/19 0241 02/25/19 0640  WBC 15.9* 9.6  --  5.7  --   CREATININE 3.29* 2.88* 4.21* 2.45* 3.35*    Estimated Creatinine Clearance: 16.4 mL/min (A) (by C-G formula based on SCr of 3.35 mg/dL (H)).    Antimicrobials this admission: ceftaroline 12/14 >> 12/21 daptomycin 12/14 >>   Microbiology results: 12/16 BCx: NG 12/15 WCx MRSA 12/13 BCx: MRSA   Thank you for allowing pharmacy to be a part of this patient's care.  Dallie Piles 02/25/2019 7:55 AM

## 2019-02-25 NOTE — Progress Notes (Signed)
HD Tx completed, tolerated well, under UF goal of 1.5 @ 0.8kg, MD aware   02/25/19 1145  Vital Signs  Pulse Rate 74  Pulse Rate Source Monitor  Resp 11  BP (!) 93/41  BP Location Left Leg  BP Method Automatic  Patient Position (if appropriate) Lying  Oxygen Therapy  SpO2 100 %  O2 Device Room Air  During Hemodialysis Assessment  HD Safety Checks Performed Yes  KECN 68.3 KECN  Dialysis Fluid Bolus Normal Saline  Bolus Amount (mL) 250 mL  Intra-Hemodialysis Comments Tx completed

## 2019-02-25 NOTE — Progress Notes (Signed)
OT Cancellation Note  Patient Details Name: Jamie Arias MRN: [id number] DOB: 01-05-1956   Cancelled Treatment:    Reason Eval/Treat Not Completed: Patient at procedure or test/ unavailable  Pt off floor at HD at this time. Will f/u as able to complete OT tx. Thank you.   Gerrianne Scale, [place], OTR/L ascom [phone] 02/25/19, 9:43 AM

## 2019-02-25 NOTE — Consult Note (Signed)
ANTICOAGULATION CONSULT NOTE  Pharmacy Consult for heparin Indication: DVT   Patient Measurements: Height: 5\' 4"  (162.6 cm) Weight: 161 lb 6 oz (73.2 kg) IBW/kg (Calculated) : 54.7 Heparin Dosing Weight: 71 kg  Vital Signs: Temp: 97.8 F (36.6 C) (12/23 1340) Temp Source: Oral (12/23 1340) BP: 124/56 (12/23 1340) Pulse Rate: 75 (12/23 1340)  Labs: Recent Labs    02/23/19 0539 02/23/19 1000 02/24/19 0241 02/24/19 1250 02/25/19 0640 02/25/19 0759 02/25/19 0845 02/25/19 1624  HGB  --    < > 6.8*  --   --  9.3* 9.0*  --   HCT  --   --  20.9*  --   --  28.0* 28.0*  --   PLT  --   --  153  --   --  166 171  --   HEPARINUNFRC  --   --  0.39 0.63 0.57  --   --  0.48  CREATININE 4.21*  --  2.45*  --  3.35*  --   --   --   CKTOTAL 29*  --   --   --   --   --   --   --    < > = values in this interval not displayed.    Estimated Creatinine Clearance: 16.4 mL/min (A) (by C-G formula based on SCr of 3.35 mg/dL (H)).   Medical History: Past Medical History:  Diagnosis Date  . Anemia   . Chronic diastolic CHF (congestive heart failure) ([place])    a. Due to ischemic cardiomyopathy. EF as low as 35%, improved to normal s/p CABG; b. echo 07/06/13: EF 55-60%, no RWMAs, mod TR, trivial pericardial effusion not c/w tamponade physiology;  c. 10/2015 Echo: EF 65%, Gr1 DD, triv AI, mild MR, mildly dil LA, mod TR, PASP 28mmHg.  Marland Kitchen Coronary artery disease    a. NSTEMI 06/2013; b.cath: severe three-vessel CAD w/ EF 30% & mild-mod MR; c. s/p 3 vessel CABG 07/02/13 (LIMA-LAD, SVG-OM, and SVG-RPDA);  d. 10/2015 MV: no ischemia/infarct.  . Diabetes mellitus without complication ([place])   . Diabetic neuropathy ([place])   . Dialysis patient [facility])    MWF  . ESRD (end stage renal disease) ([place])    a. 12/2015 initiated - [facility] dialysis.  Marland Kitchen GERD (gastroesophageal reflux disease)   . Hyperlipidemia   . Hypertension   . Hypothyroidism   . Myocardial infarction ([place]) 2015  . Neuropathy   . Pleural effusion  2015  . Pulmonary hypertension ([place])   . Renal insufficiency   . Wears dentures    full lower    Medications:  Scheduled:  . aspirin  81 mg Oral Daily  . B-complex with vitamin C  1 tablet Per Tube Daily  . chlorhexidine  15 mL Mouth Rinse BID  . Chlorhexidine Gluconate Cloth  6 each Topical Q0600  . epoetin (EPOGEN/PROCRIT) injection  4,000 Units Intravenous Q M,W,F-HD  . feeding supplement (PRO-STAT SUGAR FREE 64)  30 mL Per Tube Daily  . fluticasone  2 spray Each Nare Daily  . free water  30 mL Per Tube Q4H  . insulin aspart  0-9 Units Subcutaneous TID WC  . levothyroxine  88 mcg Oral QAC breakfast  . mouth rinse  15 mL Mouth Rinse q12n4p  . midodrine  10 mg Oral TID WC  . pantoprazole (PROTONIX) IV  40 mg Intravenous Q24H  . venlafaxine  75 mg Oral BID WC  . zinc sulfate  220 mg Oral Daily  Assessment: 63 y.o. female admitted on 02/09/2019 with MRSA  bacteremia. She was on apixaban PTA but last dose was prior to admission (12/7). [place] 12/15 revealed age-indeterminate short segment occlusive DVT involving the mid humeral aspect of one of the paired brachial veins.  Heparin was started but after repeated attempts by multiple phlebotomists lab was unable to draw labs required for heparin monitoring. Additionally, she was too confused and disoriented to swallow oral medications. On 12/20 a femoral central line was placed. She is having her AV fistula replaced today. She received a single dose of enoxaparin 12/18 and a heparin level this morning showed an untraceable level. Hgb is down to 6.8 this morning. Dr Priscella Mann is aware and has ordered a unit of PRBCs  Heparin Course: 12/21 initiation: 900 units/hr w/o bolus 12/22 0241 HL 0.39 therapeutic x 1 12/22 1250 HL 0.63: dec rate to 800 units/hr 12/23 0640 HL 0.57: no change 12/23 1624 HL 0.48: no change  Goal of Therapy:  Heparin Level: 0.3 - 0.7 units/mL Monitor platelets by anticoagulation protocol: Yes   Plan:   Therapeutic  x 2. Will continue heparin infusion at 800 units/hr  Will recheck HL with AM labs  CBC in am   Pearla Dubonnet, PharmD Clinical Pharmacist 02/25/2019 5:35 PM

## 2019-02-25 NOTE — Progress Notes (Signed)
[facility]  ROUNDING NOTE   Subjective:   Seen and examined on hemodialysis treatment. Tolerating treatment well.   More responsive to questions, but still lethargic.     HEMODIALYSIS FLOWSHEET:  Blood Flow Rate (mL/min): 300 mL/min Arterial Pressure (mmHg): -160 mmHg Venous Pressure (mmHg): 140 mmHg Transmembrane Pressure (mmHg): 60 mmHg Ultrafiltration Rate (mL/min): 170 mL/min Dialysate Flow Rate (mL/min): 600 ml/min Conductivity: Machine : 14.2 Conductivity: Machine : 14.2 Dialysis Fluid Bolus: Normal Saline Bolus Amount (mL): 250 mL Dialysate Change: 4K(Verbal order change by MD for low K+)    Objective:  Vital signs in last 24 hours:  Temp:  [97.8 F (36.6 C)-98.2 F (36.8 C)] 98.1 F (36.7 C) (12/23 0428) Pulse Rate:  [67-69] 69 (12/23 0428) Resp:  [12-20] 20 (12/23 0428) BP: (115-152)/(45-54) 121/53 (12/23 0428) SpO2:  [96 %-100 %] 100 % (12/23 0428)  Weight change:  Filed Weights   02/20/19 1155 02/20/19 1545 02/23/19 1333  Weight: 74.9 kg 73.4 kg 73.4 kg    Intake/Output: I/O last 3 completed shifts: In: 715.8 [I.V.:212.4; Blood:350; IV Piggyback:153.4] Out: -    Intake/Output this shift:  No intake/output data recorded.  Physical Exam: General: NAD, laying in bed  Head: OGT   Eyes: Anicteric, PERRL  Neck: Supple, trachea midline  Lungs:  Clear to auscultation  Heart: Regular rate and rhythm  Abdomen:  Soft, nontender,   Extremities: + peripheral edema.  Neurologic: Lethargic, not following commands, responds to verbal stimuli.   Skin: No lesions  Access: Left AVG, bleeding over access    Basic Metabolic Panel: Recent Labs  Lab 02/19/19 2119 02/21/19 1159 02/22/19 0531 02/23/19 0539 02/24/19 0241 02/25/19 0640  NA 142 141  --  143 140 140  K 3.0* 3.2* 3.1* 2.9* 3.3* 3.5  CL 100 101  --  106 104 105  CO2 28 21*  --  21* 25 21*  GLUCOSE 119* 184*  --  197* 188* 194*  BUN 11 10  --  17 9 16   CREATININE 3.29* 2.88*   --  4.21* 2.45* 3.35*  CALCIUM 8.4* 8.5*  --  8.0* 8.0* 8.1*  MG 1.9 1.7  --  1.8 2.0 2.1  PHOS 2.9  --   --  3.7 2.8 3.5    Liver Function Tests: Recent Labs  Lab 02/19/19 2119  ALBUMIN 2.2*   No results for input(s): LIPASE, AMYLASE in the last 168 hours. Recent Labs  Lab 02/18/19 1334 02/19/19 2119  AMMONIA 10 12    CBC: Recent Labs  Lab 02/19/19 2119 02/21/19 1159 02/24/19 0241 02/25/19 0759 02/25/19 0845  WBC 15.9* 9.6 5.7 7.1 5.7  HGB 8.7* 7.7* 6.8* 9.3* 9.0*  HCT 25.6* 24.3* 20.9* 28.0* 28.0*  MCV 83.1 88.4 87.1 87.5 88.9  PLT 218 227 153 166 171    Cardiac Enzymes: Recent Labs  Lab 02/23/19 0539  CKTOTAL 29*    BNP: Invalid input(s): POCBNP  CBG: Recent Labs  Lab 02/24/19 0748 02/24/19 1146 02/24/19 1703 02/24/19 2138 02/25/19 0756  GLUCAP 160* 174* 190* 190* 165*    Microbiology: Results for orders placed or performed during the hospital encounter of 02/09/19  Culture, blood (routine x 2)     Status: Abnormal   Collection Time: 02/09/19  2:29 PM   Specimen: BLOOD LEFT ARM  Result Value Ref Range Status   Specimen Description   Final    BLOOD LEFT ARM Performed at [facility], [address] [place], [place] [address]  Special Requests   Final    BOTTLES DRAWN AEROBIC AND ANAEROBIC Blood Culture adequate volume Performed at [facility], [address] [place], [place] [address]    Culture  Setup Time   Final    GRAM POSITIVE COCCI IN BOTH AEROBIC AND ANAEROBIC BOTTLES CRITICAL VALUE NOTED.  VALUE IS CONSISTENT WITH PREVIOUSLY REPORTED AND CALLED VALUE. Performed at [facility], [address] [place], [place] [address]    Culture (A)  Final    STAPHYLOCOCCUS AUREUS SUSCEPTIBILITIES PERFORMED ON PREVIOUS CULTURE WITHIN THE LAST 5 DAYS. Performed at [facility], [place] [address], [place], [place] [address]    Report Status 02/12/2019 FINAL  Final  Culture, blood (routine x 2)     Status:  Abnormal   Collection Time: 02/09/19  2:38 PM   Specimen: BLOOD LEFT ARM  Result Value Ref Range Status   Specimen Description   Final    BLOOD LEFT ARM Performed at [facility], [address], [place], [place] [address]    Special Requests   Final    BOTTLES DRAWN AEROBIC AND ANAEROBIC Blood Culture adequate volume Performed at [facility], [address], [place], [place] [address]    Culture  Setup Time   Final    GRAM POSITIVE COCCI IN BOTH AEROBIC AND ANAEROBIC BOTTLES CRITICAL RESULT CALLED TO, READ BACK BY AND VERIFIED WITH: [name] [id number] 02/10/2019 HNM Performed at [facility], [place] [address], [place], [place] [address]    Culture METHICILLIN RESISTANT STAPHYLOCOCCUS AUREUS (A)  Final   Report Status 02/12/2019 FINAL  Final   Organism ID, Bacteria METHICILLIN RESISTANT STAPHYLOCOCCUS AUREUS  Final      Susceptibility   Methicillin resistant staphylococcus aureus - MIC*    CIPROFLOXACIN >=8 RESISTANT Resistant     ERYTHROMYCIN >=8 RESISTANT Resistant     GENTAMICIN <=0.5 SENSITIVE Sensitive     OXACILLIN >=4 RESISTANT Resistant     TETRACYCLINE <=1 SENSITIVE Sensitive     VANCOMYCIN 1 SENSITIVE Sensitive     TRIMETH/SULFA <=10 SENSITIVE Sensitive     CLINDAMYCIN RESISTANT Resistant     RIFAMPIN <=0.5 SENSITIVE Sensitive     Inducible Clindamycin POSITIVE Resistant     * METHICILLIN RESISTANT STAPHYLOCOCCUS AUREUS  Blood Culture ID Panel (Reflexed)     Status: Abnormal   Collection Time: 02/09/19  2:38 PM  Result Value Ref Range Status   Enterococcus species NOT DETECTED NOT DETECTED Final   Listeria monocytogenes NOT DETECTED NOT DETECTED Final   Staphylococcus species DETECTED (A) NOT DETECTED Final    Comment: CRITICAL RESULT CALLED TO, READ BACK BY AND VERIFIED WITH: Hart Robinsons PHARMD [id number] 02/10/2019 HNM    Staphylococcus aureus (BCID) DETECTED (A) NOT DETECTED Final    Comment: Methicillin (oxacillin)-resistant Staphylococcus  aureus (MRSA). MRSA is predictably resistant to beta-lactam antibiotics (except ceftaroline). Preferred therapy is vancomycin unless clinically contraindicated. Patient requires contact precautions if  hospitalized. CRITICAL RESULT CALLED TO, READ BACK BY AND VERIFIED WITH: Hart Robinsons PHARMD [id number] 02/10/2019 HNM    Methicillin resistance DETECTED (A) NOT DETECTED Final    Comment: CRITICAL RESULT CALLED TO, READ BACK BY AND VERIFIED WITH: Hart Robinsons PHARMD [id number] 02/10/2019 HNM    Streptococcus species NOT DETECTED NOT DETECTED Final   Streptococcus agalactiae NOT DETECTED NOT DETECTED Final   Streptococcus pneumoniae NOT DETECTED NOT DETECTED Final   Streptococcus pyogenes NOT DETECTED NOT DETECTED Final   Acinetobacter baumannii NOT DETECTED NOT DETECTED  Final   Enterobacteriaceae species NOT DETECTED NOT DETECTED Final   Enterobacter cloacae complex NOT DETECTED NOT DETECTED Final   Escherichia coli NOT DETECTED NOT DETECTED Final   Klebsiella oxytoca NOT DETECTED NOT DETECTED Final   Klebsiella pneumoniae NOT DETECTED NOT DETECTED Final   Proteus species NOT DETECTED NOT DETECTED Final   Serratia marcescens NOT DETECTED NOT DETECTED Final   Haemophilus influenzae NOT DETECTED NOT DETECTED Final   Neisseria meningitidis NOT DETECTED NOT DETECTED Final   Pseudomonas aeruginosa NOT DETECTED NOT DETECTED Final   Candida albicans NOT DETECTED NOT DETECTED Final   Candida glabrata NOT DETECTED NOT DETECTED Final   Candida krusei NOT DETECTED NOT DETECTED Final   Candida parapsilosis NOT DETECTED NOT DETECTED Final   Candida tropicalis NOT DETECTED NOT DETECTED Final    Comment: Performed at [facility], [place], [place] [address]  SARS CORONAVIRUS 2 (TAT 6-24 HRS) Nasopharyngeal Nasopharyngeal Swab     Status: None   Collection Time: 02/09/19  2:54 PM   Specimen: Nasopharyngeal Swab  Result Value Ref Range Status   SARS Coronavirus 2 NEGATIVE NEGATIVE Final     Comment: (NOTE) SARS-CoV-2 target nucleic acids are NOT DETECTED. The SARS-CoV-2 RNA is generally detectable in upper and lower respiratory specimens during the acute phase of infection. Negative results do not preclude SARS-CoV-2 infection, do not rule out co-infections with other pathogens, and should not be used as the sole basis for treatment or other patient management decisions. Negative results must be combined with clinical observations, patient history, and epidemiological information. The expected result is Negative. Fact Sheet for Patients: SugarRoll.be Fact Sheet for Healthcare Providers: https://www.woods-mathews.com/ This test is not yet approved or cleared by the [place] FDA and  has been authorized for detection and/or diagnosis of SARS-CoV-2 by FDA under an Emergency Use Authorization (EUA). This EUA will remain  in effect (meaning this test can be used) for the duration of the COVID-19 declaration under Section 56 4(b)(1) of the Act, 21 U.S.C. section 360bbb-3(b)(1), unless the authorization is terminated or revoked sooner. Performed at [facility], [place] [address], [place], [place] [address]   MRSA PCR Screening     Status: Abnormal   Collection Time: 02/10/19  5:03 PM   Specimen: Nasopharyngeal  Result Value Ref Range Status   MRSA by PCR POSITIVE (A) NEGATIVE Final    Comment:        The GeneXpert MRSA Assay (FDA approved for NASAL specimens only), is one component of a comprehensive MRSA colonization surveillance program. It is not intended to diagnose MRSA infection nor to guide or monitor treatment for MRSA infections. RESULT CALLED TO, READ BACK BY AND VERIFIED WITH: ALEKSEY FRASER @1839  02/10/19 MJU Performed at [facility], [address] [place], [place] [address]   Culture, blood (routine x 2)     Status: Abnormal   Collection Time: 02/11/19 12:31 PM   Specimen: BLOOD  Result  Value Ref Range Status   Specimen Description   Final    BLOOD PORTA CATH Performed at [place] [address] [place], [place] [address]    Special Requests   Final    BOTTLES DRAWN AEROBIC AND ANAEROBIC Blood Culture adequate volume Performed at [place] [place],  [address]    Culture  Setup Time   Final    GRAM POSITIVE COCCI ANAEROBIC BOTTLE ONLY CRITICAL RESULT CALLED TO, READ BACK BY AND VERIFIED  WITH: Hart Robinsons AT [id number] 02/12/2019 [facility]  Performed at [facility], [address] [place], [place] [address]    Culture (A)  Final    STAPHYLOCOCCUS AUREUS SUSCEPTIBILITIES PERFORMED ON PREVIOUS CULTURE WITHIN THE LAST 5 DAYS. Performed at [facility], [place] [address] [place], [place] [address]    Report Status 02/13/2019 FINAL  Final  Culture, blood (routine x 2)     Status: None   Collection Time: 02/11/19  3:57 PM   Specimen: BLOOD  Result Value Ref Range Status   Specimen Description BLOOD PORTA CATH  Final   Special Requests   Final    BOTTLES DRAWN AEROBIC AND ANAEROBIC Blood Culture adequate volume   Culture   Final    NO GROWTH 5 DAYS Performed at [facility], [address] [place], [place] [address]    Report Status 02/16/2019 FINAL  Final  CULTURE, BLOOD (ROUTINE X 2) w Reflex to ID Panel     Status: Abnormal   Collection Time: 02/13/19  2:18 PM   Specimen: BLOOD LEFT HAND  Result Value Ref Range Status   Specimen Description BLOOD LEFT HAND  Final   Special Requests   Final    BOTTLES DRAWN AEROBIC AND ANAEROBIC Blood Culture results may not be optimal due to an inadequate volume of blood received in culture bottles   Culture  Setup Time   Final    AEROBIC BOTTLE ONLY GRAM POSITIVE COCCI IN CLUSTERS CRITICAL RESULT CALLED TO, READ BACK BY AND VERIFIED WITH: Herbert Pun AT [id number] ON 02/14/2019 [place]. Performed at [facility], [place] [address], [place], [place] [address]     Culture METHICILLIN RESISTANT STAPHYLOCOCCUS AUREUS (A)  Final   Report Status 02/16/2019 FINAL  Final   Organism ID, Bacteria METHICILLIN RESISTANT STAPHYLOCOCCUS AUREUS  Final      Susceptibility   Methicillin resistant staphylococcus aureus - MIC*    CIPROFLOXACIN >=8 RESISTANT Resistant     ERYTHROMYCIN >=8 RESISTANT Resistant     GENTAMICIN <=0.5 SENSITIVE Sensitive     OXACILLIN >=4 RESISTANT Resistant     TETRACYCLINE <=1 SENSITIVE Sensitive     VANCOMYCIN <=0.5 SENSITIVE Sensitive     TRIMETH/SULFA <=10 SENSITIVE Sensitive     CLINDAMYCIN RESISTANT Resistant     RIFAMPIN <=0.5 SENSITIVE Sensitive     Inducible Clindamycin POSITIVE Resistant     * METHICILLIN RESISTANT STAPHYLOCOCCUS AUREUS  Blood Culture ID Panel (Reflexed)     Status: Abnormal   Collection Time: 02/13/19  2:18 PM  Result Value Ref Range Status   Enterococcus species NOT DETECTED NOT DETECTED Final   Listeria monocytogenes NOT DETECTED NOT DETECTED Final   Staphylococcus species DETECTED (A) NOT DETECTED Final    Comment: CRITICAL RESULT CALLED TO, READ BACK BY AND VERIFIED WITH: ABBEY E[place]INGTON AT [id number] ON 02/14/2019 [place].    Staphylococcus aureus (BCID) DETECTED (A) NOT DETECTED Final    Comment: Methicillin (oxacillin)-resistant Staphylococcus aureus (MRSA). MRSA is predictably resistant to beta-lactam antibiotics (except ceftaroline). Preferred therapy is vancomycin unless clinically contraindicated. Patient requires contact precautions if  hospitalized. CRITICAL RESULT CALLED TO, READ BACK BY AND VERIFIED WITH: Herbert Pun AT [id number] ON 02/14/2019 [place].    Methicillin resistance DETECTED (A) NOT DETECTED Final    Comment: CRITICAL RESULT CALLED TO, READ BACK BY AND VERIFIED WITH: Herbert Pun AT [id number] ON 02/14/2019 [place].    Streptococcus species NOT DETECTED NOT DETECTED Final   Streptococcus agalactiae NOT DETECTED NOT DETECTED  Final   Streptococcus pneumoniae NOT DETECTED NOT DETECTED Final    Streptococcus pyogenes NOT DETECTED NOT DETECTED Final   Acinetobacter baumannii NOT DETECTED NOT DETECTED Final   Enterobacteriaceae species NOT DETECTED NOT DETECTED Final   Enterobacter cloacae complex NOT DETECTED NOT DETECTED Final   Escherichia coli NOT DETECTED NOT DETECTED Final   Klebsiella oxytoca NOT DETECTED NOT DETECTED Final   Klebsiella pneumoniae NOT DETECTED NOT DETECTED Final   Proteus species NOT DETECTED NOT DETECTED Final   Serratia marcescens NOT DETECTED NOT DETECTED Final   Haemophilus influenzae NOT DETECTED NOT DETECTED Final   Neisseria meningitidis NOT DETECTED NOT DETECTED Final   Pseudomonas aeruginosa NOT DETECTED NOT DETECTED Final   Candida albicans NOT DETECTED NOT DETECTED Final   Candida glabrata NOT DETECTED NOT DETECTED Final   Candida krusei NOT DETECTED NOT DETECTED Final   Candida parapsilosis NOT DETECTED NOT DETECTED Final   Candida tropicalis NOT DETECTED NOT DETECTED Final    Comment: Performed at [facility], [address] [place], [place] [address]  CULTURE, BLOOD (ROUTINE X 2) w Reflex to ID Panel     Status: None   Collection Time: 02/13/19  3:09 PM   Specimen: BLOOD  Result Value Ref Range Status   Specimen Description BLOOD BLOOD LEFT HAND  Final   Special Requests   Final    BOTTLES DRAWN AEROBIC AND ANAEROBIC Blood Culture adequate volume   Culture   Final    NO GROWTH 5 DAYS Performed at [facility], [address] [place], [place] [address]    Report Status 02/18/2019 FINAL  Final  Culture, blood (routine x 2)     Status: Abnormal   Collection Time: 02/15/19 12:49 PM   Specimen: BLOOD  Result Value Ref Range Status   Specimen Description   Final    BLOOD LT HAND Performed at [facility], [address] [place], [place] [address]    Special Requests   Final    BOTTLES DRAWN AEROBIC AND ANAEROBIC Blood Culture results may not be optimal due to an inadequate volume of blood received in  culture bottles Performed at [facility], [address] [place], [place] [address]    Culture  Setup Time   Final    GRAM POSITIVE COCCI IN BOTH AEROBIC AND ANAEROBIC BOTTLES CRITICAL RESULT CALLED TO, READ BACK BY AND VERIFIED WITHEleonore Chiquito AT [id number] 02/16/2019 SDR Performed at [facility], [address] [place], [place] [address]    Culture (A)  Final    METHICILLIN RESISTANT STAPHYLOCOCCUS AUREUS STAPHYLOCOCCUS SPECIES (COAGULASE NEGATIVE) THE SIGNIFICANCE OF ISOLATING THIS ORGANISM FROM A SINGLE SET OF BLOOD CULTURES WHEN MULTIPLE SETS ARE DRAWN IS UNCERTAIN. PLEASE NOTIFY THE MICROBIOLOGY DEPARTMENT WITHIN ONE WEEK IF SPECIATION AND SENSITIVITIES ARE REQUIRED. Performed at [facility], [place] [address] [place], [place] [address]    Report Status 02/18/2019 FINAL  Final   Organism ID, Bacteria METHICILLIN RESISTANT STAPHYLOCOCCUS AUREUS  Final      Susceptibility   Methicillin resistant staphylococcus aureus - MIC*    CIPROFLOXACIN >=8 RESISTANT Resistant     ERYTHROMYCIN >=8 RESISTANT Resistant     GENTAMICIN <=0.5 SENSITIVE Sensitive     OXACILLIN >=4 RESISTANT Resistant     TETRACYCLINE <=1 SENSITIVE Sensitive     VANCOMYCIN <=0.5 SENSITIVE Sensitive     TRIMETH/SULFA <=10 SENSITIVE Sensitive     CLINDAMYCIN RESISTANT Resistant     RIFAMPIN <=0.5 SENSITIVE Sensitive  Inducible Clindamycin POSITIVE Resistant     * METHICILLIN RESISTANT STAPHYLOCOCCUS AUREUS  Aerobic Culture (superficial specimen)     Status: None   Collection Time: 02/17/19  1:31 PM   Specimen: Heel; Wound  Result Value Ref Range Status   Specimen Description   Final    HEEL Performed at [facility], [address] [place], [place] [address]    Special Requests   Final    NONE Performed at [facility], [address] [place], [place] [address]    Gram Stain   Final    NO WBC SEEN MODERATE GRAM POSITIVE COCCI IN PAIRS    Culture   Final     ABUNDANT METHICILLIN RESISTANT STAPHYLOCOCCUS AUREUS ABUNDANT DIPHTHEROIDS(CORYNEBACTERIUM SPECIES) Standardized susceptibility testing for this organism is not available. Performed at [facility], [place] [address], [place], [place] [address]    Report Status 02/19/2019 FINAL  Final   Organism ID, Bacteria METHICILLIN RESISTANT STAPHYLOCOCCUS AUREUS  Final      Susceptibility   Methicillin resistant staphylococcus aureus - MIC*    CIPROFLOXACIN >=8 RESISTANT Resistant     ERYTHROMYCIN >=8 RESISTANT Resistant     GENTAMICIN <=0.5 SENSITIVE Sensitive     OXACILLIN >=4 RESISTANT Resistant     TETRACYCLINE <=1 SENSITIVE Sensitive     VANCOMYCIN 1 SENSITIVE Sensitive     TRIMETH/SULFA <=10 SENSITIVE Sensitive     CLINDAMYCIN RESISTANT Resistant     RIFAMPIN <=0.5 SENSITIVE Sensitive     Inducible Clindamycin POSITIVE Resistant     * ABUNDANT METHICILLIN RESISTANT STAPHYLOCOCCUS AUREUS  CULTURE, BLOOD (ROUTINE X 2) w Reflex to ID Panel     Status: None   Collection Time: 02/18/19 11:29 PM   Specimen: BLOOD  Result Value Ref Range Status   Specimen Description BLOOD LEFT ARM  Final   Special Requests   Final    BOTTLES DRAWN AEROBIC AND ANAEROBIC Blood Culture adequate volume   Culture   Final    NO GROWTH 5 DAYS Performed at [facility], [address] [place], [place] [address]    Report Status 02/23/2019 FINAL  Final  CULTURE, BLOOD (ROUTINE X 2) w Reflex to ID Panel     Status: None   Collection Time: 02/18/19 11:29 PM   Specimen: BLOOD  Result Value Ref Range Status   Specimen Description BLOOD LEFT ARM  Final   Special Requests   Final    BOTTLES DRAWN AEROBIC AND ANAEROBIC Blood Culture adequate volume   Culture   Final    NO GROWTH 5 DAYS Performed at [facility] [place], [address] [place], [place] [address]    Report Status 02/23/2019 FINAL  Final    Coagulation Studies: No results for input(s): LABPROT, INR in the last 72  hours.  Urinalysis: No results for input(s): COLORURINE, LABSPEC, PHURINE, GLUCOSEU, HGBUR, BILIRUBINUR, KETONESUR, PROTEINUR, UROBILINOGEN, NITRITE, LEUKOCYTESUR in the last 72 hours.  Invalid input(s): APPERANCEUR    Imaging: PERIPHERAL VASCULAR CATHETERIZATION  Result Date: 02/23/2019 See op note  DG [facility] D[facility] [employer]ube [facility] W/Fl W/Rad  Result Date: 02/24/2019 CLINICAL DATA:  NG tube replacement requested. EXAM: NASO G TUBE PLACEMENT WITH FL AND WITH RAD CONTRAST:  None FLUOROSCOPY TIME:  Fluoroscopy Time:  2 minutes 54 seconds Radiation Exposure Index (if provided by the fluoroscopic device): 60.0 mGy COMPARISON:  Chest x-ray 02/09/2019. FINDINGS: NG tube was placed under fluoroscopic guidance without complication. Tube tip and side hole placed in the stomach. NG  tube was taped to the nose and the patient sent back to the floor in good clinical condition. IMPRESSION: Successful fluoroscopically guided NG tube placement. Electronically Signed   By: [place]   On: 02/24/2019 10:08     Medications:   . sodium chloride 250 mL (02/24/19 2008)  . DAPTOmycin (CUBICIN)  IV 650 mg (02/24/19 2009)  . feeding supplement (OSMOLITE 1.5 CAL)    . heparin 800 Units/hr (02/24/19 1533)  . potassium chloride     . aspirin  81 mg Oral Daily  . B-complex with vitamin C  1 tablet Per Tube Daily  . chlorhexidine  15 mL Mouth Rinse BID  . Chlorhexidine Gluconate Cloth  6 each Topical Q0600  . epoetin (EPOGEN/PROCRIT) injection  4,000 Units Intravenous Q M,W,F-HD  . feeding supplement (PRO-STAT SUGAR FREE 64)  30 mL Per Tube Daily  . fluticasone  2 spray Each Nare Daily  . free water  30 mL Per Tube Q4H  . insulin aspart  0-9 Units Subcutaneous TID WC  . levothyroxine  88 mcg Oral QAC breakfast  . mouth rinse  15 mL Mouth Rinse q12n4p  . midodrine  10 mg Oral TID WC  . pantoprazole (PROTONIX) IV  40 mg Intravenous Q24H  . venlafaxine  75 mg Oral BID WC  . zinc sulfate  220 mg Oral Daily    sodium chloride, haloperidol lactate, ondansetron (ZOFRAN) IV  Assessment/ Plan:  [name] is a 63 y.o. white female with end-stage renal disease on hemodialysis, atrial fibrillation, chronic diastolic congestive heart failure, coronary artery disease status post CABG, diabetes mellitus type II, hypertension, hyperlipidemia, hypothyroidism, Covid positive in July 2020, recent nursing home stay in [place] Prolonged hospitalization for MRSA bacteremia/sepsis and now with acute encephalopathy  [facility] [address] Left AVG 81.5kg  1. End Stage Renal Disease: on hemodialysis. With complication of dialysis device. Angiogram on 12/21 shows no communication with bleed to AVG.  Placed tunneled catheter until her AVG site has healed. - Seen and examined on hemodialysis treatment. Tolerating treatment well.   2. Hypotension - midodrine  3. Anemia of chronic kidney disease: hemoglobin 9 Status post PRBC transfusion on 12/22 - EPO with HD treatments.   4. Secondary Hyperparathyroidism: not currently on binders.   5. Acute Encephalopathy: improving off ceftaroline.  - Appreciate neurology input.    LOS: [place] 12/23/202012:38 PM

## 2019-02-25 NOTE — Progress Notes (Signed)
Pharmacy Electrolyte Monitoring Consult:  Pharmacy consulted to assist in monitoring and replacing electrolytes in this 63 y.o. female admitted on 02/09/2019. Patient with ESRD; last dialysis 12/14. Patient with MRSA bacteremia. She has a h/o CAD and is s/p CABG  Labs:  Sodium (mmol/L)  Date Value  02/25/2019 140  03/30/2014 140   Potassium (mmol/L)  Date Value  02/25/2019 3.5  03/30/2014 4.3   Magnesium (mg/dL)  Date Value  02/25/2019 2.1  07/19/2013 1.7 (L)   Phosphorus (mg/dL)  Date Value  02/25/2019 3.5   Calcium (mg/dL)  Date Value  02/25/2019 8.1 (L)   Calcium, Total (mg/dL)  Date Value  03/30/2014 9.0   Albumin (g/dL)  Date Value  02/19/2019 2.2 (L)  02/11/2018 4.2  07/23/2013 2.7 (L)   Corrected Ca: 9.8 mg/dL  Goals of Therapy Given Cardiac History: Potassium 4.0 - 5.1 mmol/L Magnesium 2.0 - 2.4 mg/dL All Other Electrolytes WNL   Plan:  Replace potassium with 20 mEq IV KCl  Renal function panel, magnesium level in am  Pharmacy will continue to monitor and adjust per consult.   Dallie Piles, [facility] 02/25/2019 7:49 AM

## 2019-02-25 NOTE — Progress Notes (Signed)
Daily Progress Note   Patient Name: Jamie Arias       Date: 02/25/2019 DOB: February 13, 1956  Age: 63 y.o. MRN#: [id number] Attending Physician: Damita Lack, MD Primary Care Physician: [name], MD Admit Date: 02/09/2019  Reason for Consultation/Follow-up: Establishing goals of care  Subjective: Patient is showing improvement with responsiveness. Spoke with husband. He would like to continue care at this time. Will reach back out to husband Monday.   Length of Stay: 16  Current Medications: Scheduled Meds:  . aspirin  81 mg Oral Daily  . B-complex with vitamin C  1 tablet Per Tube Daily  . chlorhexidine  15 mL Mouth Rinse BID  . Chlorhexidine Gluconate Cloth  6 each Topical Q0600  . epoetin (EPOGEN/PROCRIT) injection  4,000 Units Intravenous Q M,W,F-HD  . feeding supplement (PRO-STAT SUGAR FREE 64)  30 mL Per Tube Daily  . fluticasone  2 spray Each Nare Daily  . free water  30 mL Per Tube Q4H  . insulin aspart  0-9 Units Subcutaneous TID WC  . levothyroxine  88 mcg Oral QAC breakfast  . mouth rinse  15 mL Mouth Rinse q12n4p  . midodrine  10 mg Oral TID WC  . pantoprazole (PROTONIX) IV  40 mg Intravenous Q24H  . venlafaxine  75 mg Oral BID WC  . zinc sulfate  220 mg Oral Daily    Continuous Infusions: . sodium chloride 250 mL (02/24/19 2008)  . DAPTOmycin (CUBICIN)  IV 650 mg (02/24/19 2009)  . feeding supplement (OSMOLITE 1.5 CAL)    . heparin 800 Units/hr (02/24/19 1533)  . potassium chloride 10 mEq (02/25/19 1443)    PRN Meds: sodium chloride, haloperidol lactate, ondansetron (ZOFRAN) IV  Physical Exam Constitutional:      Comments: Opens eyes.  Pulmonary:     Effort: Pulmonary effort is normal.             Vital Signs: BP (!) 124/56 (BP Location:  Left Arm)   Pulse 75   Temp 97.8 F (36.6 C) (Oral)   Resp 18   Ht 5\' 4"  (1.626 m)   Wt 73.4 kg   SpO2 100%   BMI 27.78 kg/m  SpO2: SpO2: 100 % O2 Device: O2 Device: Room Air O2 Flow Rate: O2 Flow Rate (L/min): 2 L/min  Intake/output  summary:   Intake/Output Summary (Last 24 hours) at 02/25/2019 1554 Last data filed at 02/24/2019 2115 Gross per 24 hour  Intake 634.94 ml  Output -  Net 634.94 ml   LBM: Last BM Date: 02/20/19 Baseline Weight: Weight: 59 kg Most recent weight: Weight: 73.4 kg       Palliative Assessment/Data:       Patient Active Problem List   Diagnosis Date Noted  . Arm DVT (deep venous thromboembolism), acute, left ([place]) 02/13/2019  . Acute encephalopathy 02/13/2019  . Pressure injury of skin 02/10/2019  . Sepsis due to methicillin resistant Staphylococcus aureus (MRSA) ([place]) 02/10/2019  . MRSA bacteremia 02/10/2019  . Altered level of consciousness 02/09/2019  . Adjustment disorder with depressed mood 08/31/2018  . COVID-19 virus infection 08/24/2018  . Acute on chronic respiratory failure with hypoxia ([place]) 08/24/2018  . UTI (urinary tract infection) 08/24/2018  . Allergic rhinitis 09/19/2017  . Uremia of renal origin 09/04/2017  . Chronic neck pain 07/25/2017  . Hyperkalemia 02/15/2017  . Complication from renal dialysis device 12/01/2016  . ESRD (end stage renal disease) ([place]) 01/19/2016  . GI bleed 10/25/2015  . Hypertensive heart disease 10/24/2015  . Chronic diastolic CHF (congestive heart failure) ([place]) 10/24/2015  . Unstable angina ([place]) 10/23/2015  . Hypotension 09/02/2015  . Chronic systolic CHF (congestive heart failure) ([place]) 09/02/2015  . Depression 07/27/2015  . Calculus of gallbladder with chronic cholecystitis without obstruction   . Bilateral carotid bruits 11/30/2014  . Low magnesium levels 08/10/2013  . Anemia 08/10/2013  . Constipation 08/10/2013  . Postoperative atrial fibrillation ([place]) 07/30/2013  . Coronary  artery disease   . CAD (coronary artery disease) 07/02/2013  . Acute systolic heart failure ([place]) 06/30/2013  . NSTEMI (non-ST elevated myocardial infarction) ([place]) 06/29/2013  . Vertigo 08/25/2012  . Sleep disorder 05/23/2012  . Hypothyroid 04/27/2012  . HTN (hypertension) 04/27/2012  . HLD (hyperlipidemia) 04/27/2012  . Diabetes mellitus, type 2 ([place]) 04/27/2012  . Neuropathy 04/27/2012    Palliative Care Assessment & Plan    Recommendations/Plan:  Continue current care.  Code Status:    Code Status Orders  (From admission, onward)         Start     Ordered   02/21/19 0930  Do not attempt resuscitation (DNR)  Continuous    Question Answer Comment  In the event of cardiac or respiratory ARREST Do not call a "code blue"   In the event of cardiac or respiratory ARREST Do not perform Intubation, CPR, defibrillation or ACLS   In the event of cardiac or respiratory ARREST Use medication by any route, position, wound care, and other measures to relive pain and suffering. May use oxygen, suction and manual treatment of airway obstruction as needed for comfort.      02/21/19 0932        Code Status History    Date Active Date Inactive Code Status Order ID Comments User Context   02/09/2019 [id number] 02/21/2019 0932 Full Code [id number]  Thornell Mule, MD ED   08/24/2018 0022 09/15/2018 1720 Full Code [id number]  Shela Leff, MD Inpatient   09/04/2017 1510 09/06/2017 1920 Full Code [id number]  Epifanio Lesches, MD ED   02/15/2017 1243 02/16/2017 2023 Full Code [id number]  Saundra Shelling, MD Inpatient   10/23/2015 0828 10/23/2015 0908 Full Code [id number]  Saundra Shelling, MD Inpatient   09/03/2015 0130 09/03/2015 1619 Full Code [id number]  Lance Coon, MD Inpatient   07/02/2013 1327 07/14/2013 1540 Full Code [id number]  Tacy Dura,  Leona Singleton Inpatient   06/29/2013 2153 07/02/2013 1327 Full Code [id number]  Larey Dresser, MD Inpatient   Advance Care Planning Activity        Prognosis:   Unable to determine   Thank you for allowing the Palliative Medicine Team to assist in the care of this patient.   Total Time 25 min Prolonged Time Billed  no      Greater than 50%  of this time was spent counseling and coordinating care related to the above assessment and plan.  Asencion Gowda, NP  Please contact Palliative Medicine Team phone at [phone] for questions and concerns.

## 2019-02-25 NOTE — Progress Notes (Signed)
PROGRESS NOTE    Jamie Arias  [id number] DOB: 11-26-1955 DOA: 02/09/2019 PCP: [name], MD   Brief Narrative:  63 year old with history of ESRD on HD, hypothyroidism, DM 2, admitted to the hospital for severe septic shock,  encephalopathic found to have MRSA bacteremia.  Started on IV vancomycin, nephrology and infectious disease were consulted.  Echocardiogram was negative therefore TEE planned per cardiology.  Due to difficult access femoral line was placed, Teflaro stopped.  Issues with hemodialysis access therefore tunneled catheter placed until the graft site is healed   Assessment & Plan:   Principal Problem:   Sepsis due to methicillin resistant Staphylococcus aureus (MRSA) ([place]) Active Problems:   Diabetes mellitus, type 2 (HCC)   Hypotension   Chronic diastolic CHF (congestive heart failure) (HCC)   ESRD (end stage renal disease) (HCC)   Altered level of consciousness   Pressure injury of skin   MRSA bacteremia   Arm DVT (deep venous thromboembolism), acute, left (HCC)   Acute encephalopathy   Septic shock secondary to MRSA bacteremia -Shock physiology resolved.  Sepsis physiology improving echo-negative.  CT spine-negative -TEE pending when mentation improves -Antibiotics has been changed around with the help of infectious disease-currently on daptomycin.  ESRD on hemodialysis -Issues with AV graft therefore tunneled catheter placed by vascular  Acute metabolic encephalopathy, improving -Likely sepsis related.  MRI brain-negative.  EEG-negative -Ammonia-normal.  Seen by neurology, does not think LP would give any diagnostic value at this point. -TSH-noted -Blood glucose-normal. -Question that her mentation could be improving after discontinuing ceftaroline  Acute left arm DVT -On heparin drip  History of chronic atrial fibrillation -On amiodarone and Eliquis which are currently on hold until p.o. status improves.  Chronic diastolic congestive  heart failure -Continue to monitor volume status  Diabetes mellitus type 2 -Insulin sliding scale and Accu-Chek  Hypothyroidism -Synthroid 88 mcg daily  GERD -PPI  Anemia of chronic disease -Intermittently received transfusion secondary to blood loss anemia, iatrogenic  Goals of care discussion -Palliative care following.  Currently patient is DNR. -NG tube in place.  DVT prophylaxis: Heparin drip Code Status: DNR Family Communication: Spoke with her husband [name] over the phone Disposition Plan: Pending mental improvement prior to transitioning the patient.  Consultants:   ID  Nephrology  Cardiology  Neurology  Procedures:   Right femoral CVC 12/8-removed  Right femoral arterial line 12/8-removed  Left chest permacath 12/21  Antimicrobials:   Vancomycin 12/7-12/14  Ceftaroline 12/14-12/21  Daptomycin 12/14   Subjective: Somewhat awake this morning answers very basic questions.  She was seen in her bed after dialysis.  Review of Systems Otherwise negative except as per HPI, including: General: Denies fever, chills, night sweats or unintended weight loss. Resp: Denies cough, wheezing, shortness of breath. Cardiac: Denies chest pain, palpitations, orthopnea, paroxysmal nocturnal dyspnea. GI: Denies abdominal pain, nausea, vomiting, diarrhea or constipation GU: Denies dysuria, frequency, hesitancy or incontinence MS: Denies muscle aches, joint pain or swelling Neuro: Denies headache, neurologic deficits (focal weakness, numbness, tingling), abnormal gait Psych: Denies anxiety, depression, SI/HI/AVH Skin: Denies new rashes or lesions ID: Denies sick contacts, exotic exposures, travel  Objective: Vitals:   02/24/19 1809 02/24/19 2115 02/24/19 2121 02/25/19 0428  BP: (!) 115/49 (!) 152/54 (!) 152/54 (!) 121/53  Pulse: 68 69 69 69  Resp: 14 12 12 20   Temp: 97.8 F (36.6 C) 98.2 F (36.8 C) 98.2 F (36.8 C) 98.1 F (36.7 C)  TempSrc: Axillary  Axillary Oral Oral  SpO2: 96%  99% 100%  Weight:      Height:        Intake/Output Summary (Last 24 hours) at 02/25/2019 0750 Last data filed at 02/24/2019 2115 Gross per 24 hour  Intake 634.94 ml  Output --  Net 634.94 ml   Filed Weights   02/20/19 1155 02/20/19 1545 02/23/19 1333  Weight: 74.9 kg 73.4 kg 73.4 kg    Examination:  General exam: Appears chronically ill, NG tube in place Respiratory system: Diminished breath sounds bilaterally Cardiovascular system: Normal sinus rhythm Gastrointestinal system: Abdomen is nontender nondistended Central nervous system: Alert to name and place only Extremities: Symmetric 4 x 5 power. Skin: No rashes, lesions or ulcers Psychiatry: Poor judgment and insight  External catheter   Data Reviewed:   CBC: Recent Labs  Lab 02/19/19 2119 02/21/19 1159 02/24/19 0241  WBC 15.9* 9.6 5.7  HGB 8.7* 7.7* 6.8*  HCT 25.6* 24.3* 20.9*  MCV 83.1 88.4 87.1  PLT 218 227 0000000   Basic Metabolic Panel: Recent Labs  Lab 02/18/19 1200 02/19/19 2119 02/21/19 1159 02/22/19 0531 02/23/19 0539 02/24/19 0241 02/25/19 0640  NA  --  142 141  --  143 140 140  K  --  3.0* 3.2* 3.1* 2.9* 3.3* 3.5  CL  --  100 101  --  106 104 105  CO2  --  28 21*  --  21* 25 21*  GLUCOSE  --  119* 184*  --  197* 188* 194*  BUN  --  11 10  --  17 9 16   CREATININE  --  3.29* 2.88*  --  4.21* 2.45* 3.35*  CALCIUM  --  8.4* 8.5*  --  8.0* 8.0* 8.1*  MG  --  1.9 1.7  --  1.8 2.0 2.1  PHOS 3.0 2.9  --   --  3.7 2.8 3.5   GFR: Estimated Creatinine Clearance: 16.4 mL/min (A) (by C-G formula based on SCr of 3.35 mg/dL (H)). Liver Function Tests: Recent Labs  Lab 02/19/19 2119  ALBUMIN 2.2*   No results for input(s): LIPASE, AMYLASE in the last 168 hours. Recent Labs  Lab 02/18/19 1334 02/19/19 2119  AMMONIA 10 12   Coagulation Profile: No results for input(s): INR, PROTIME in the last 168 hours. Cardiac Enzymes: Recent Labs  Lab 02/23/19 0539   CKTOTAL 29*   BNP (last 3 results) No results for input(s): PROBNP in the last 8760 hours. HbA1C: No results for input(s): HGBA1C in the last 72 hours. CBG: Recent Labs  Lab 02/23/19 2133 02/24/19 0748 02/24/19 1146 02/24/19 1703 02/24/19 2138  GLUCAP 148* 160* 174* 190* 190*   Lipid Profile: No results for input(s): CHOL, HDL, LDLCALC, TRIG, CHOLHDL, LDLDIRECT in the last 72 hours. Thyroid Function Tests: No results for input(s): TSH, T4TOTAL, FREET4, T3FREE, THYROIDAB in the last 72 hours. Anemia Panel: No results for input(s): VITAMINB12, FOLATE, FERRITIN, TIBC, IRON, RETICCTPCT in the last 72 hours. Sepsis Labs: No results for input(s): PROCALCITON, LATICACIDVEN in the last 168 hours.  Recent Results (from the past 240 hour(s))  Culture, blood (routine x 2)     Status: Abnormal   Collection Time: 02/15/19 12:49 PM   Specimen: BLOOD  Result Value Ref Range Status   Specimen Description   Final    BLOOD LT HAND Performed at [facility], [address], [place], [place] [address]    Special Requests   Final    BOTTLES DRAWN AEROBIC AND ANAEROBIC Blood Culture results may not  be optimal due to an inadequate volume of blood received in culture bottles Performed at [facility], [address] [place], [place] [address]    Culture  Setup Time   Final    GRAM POSITIVE COCCI IN BOTH AEROBIC AND ANAEROBIC BOTTLES CRITICAL RESULT CALLED TO, READ BACK BY AND VERIFIED WITH: Eleonore Chiquito AT [id number] 02/16/2019 [place] Performed at [facility], [address] [place], [place] [address]    Culture (A)  Final    METHICILLIN RESISTANT STAPHYLOCOCCUS AUREUS STAPHYLOCOCCUS SPECIES (COAGULASE NEGATIVE) THE SIGNIFICANCE OF ISOLATING THIS ORGANISM FROM A SINGLE SET OF BLOOD CULTURES WHEN MULTIPLE SETS ARE DRAWN IS UNCERTAIN. PLEASE NOTIFY THE MICROBIOLOGY DEPARTMENT WITHIN ONE WEEK IF SPECIATION AND SENSITIVITIES ARE REQUIRED. Performed at [facility], [place] [address] [place], [place] [address]    Report Status 02/18/2019 FINAL  Final   Organism ID, Bacteria METHICILLIN RESISTANT STAPHYLOCOCCUS AUREUS  Final      Susceptibility   Methicillin resistant staphylococcus aureus - MIC*    CIPROFLOXACIN >=8 RESISTANT Resistant     ERYTHROMYCIN >=8 RESISTANT Resistant     GENTAMICIN <=0.5 SENSITIVE Sensitive     OXACILLIN >=4 RESISTANT Resistant     TETRACYCLINE <=1 SENSITIVE Sensitive     VANCOMYCIN <=0.5 SENSITIVE Sensitive     TRIMETH/SULFA <=10 SENSITIVE Sensitive     CLINDAMYCIN RESISTANT Resistant     RIFAMPIN <=0.5 SENSITIVE Sensitive     Inducible Clindamycin POSITIVE Resistant     * METHICILLIN RESISTANT STAPHYLOCOCCUS AUREUS  Aerobic Culture (superficial specimen)     Status: None   Collection Time: 02/17/19  1:31 PM   Specimen: Heel; Wound  Result Value Ref Range Status   Specimen Description   Final    HEEL Performed at [facility], [address] [place], [place] [address]    Special Requests   Final    NONE Performed at [facility], [address] [place], [place] [address]    Gram Stain   Final    NO WBC SEEN MODERATE GRAM POSITIVE COCCI IN PAIRS    Culture   Final    ABUNDANT METHICILLIN RESISTANT STAPHYLOCOCCUS AUREUS ABUNDANT DIPHTHEROIDS(CORYNEBACTERIUM SPECIES) Standardized susceptibility testing for this organism is not available. Performed at [facility], [place] [address], [place], [place] [address]    Report Status 02/19/2019 FINAL  Final   Organism ID, Bacteria METHICILLIN RESISTANT STAPHYLOCOCCUS AUREUS  Final      Susceptibility   Methicillin resistant staphylococcus aureus - MIC*    CIPROFLOXACIN >=8 RESISTANT Resistant     ERYTHROMYCIN >=8 RESISTANT Resistant     GENTAMICIN <=0.5 SENSITIVE Sensitive     OXACILLIN >=4 RESISTANT Resistant     TETRACYCLINE <=1 SENSITIVE Sensitive     VANCOMYCIN 1 SENSITIVE Sensitive     TRIMETH/SULFA <=10 SENSITIVE Sensitive      CLINDAMYCIN RESISTANT Resistant     RIFAMPIN <=0.5 SENSITIVE Sensitive     Inducible Clindamycin POSITIVE Resistant     * ABUNDANT METHICILLIN RESISTANT STAPHYLOCOCCUS AUREUS  CULTURE, BLOOD (ROUTINE X 2) w Reflex to ID Panel     Status: None   Collection Time: 02/18/19 11:29 PM   Specimen: BLOOD  Result Value Ref Range Status   Specimen Description BLOOD LEFT ARM  Final   Special Requests   Final    BOTTLES DRAWN AEROBIC AND ANAEROBIC Blood Culture adequate volume   Culture   Final    NO GROWTH 5 DAYS Performed at [facility], [address] [place], [place] [address]    Report Status 02/23/2019 FINAL  Final  CULTURE, BLOOD (ROUTINE X 2) w Reflex to ID Panel     Status: None   Collection Time: 02/18/19 11:29 PM   Specimen: BLOOD  Result Value Ref Range Status   Specimen Description BLOOD LEFT ARM  Final   Special Requests   Final    BOTTLES DRAWN AEROBIC AND ANAEROBIC Blood Culture adequate volume   Culture   Final    NO GROWTH 5 DAYS Performed at [facility], [address] [place], [place] [address]    Report Status 02/23/2019 FINAL  Final         Radiology Studies: PERIPHERAL VASCULAR CATHETERIZATION  Result Date: 02/23/2019 See op note  DG [place] Dys Tube Plc W/Fl W/Rad  Result Date: 02/24/2019 CLINICAL DATA:  NG tube replacement requested. EXAM: NASO G TUBE PLACEMENT WITH FL AND WITH RAD CONTRAST:  None FLUOROSCOPY TIME:  Fluoroscopy Time:  2 minutes 54 seconds Radiation Exposure Index (if provided by the fluoroscopic device): 60.0 mGy COMPARISON:  Chest x-ray 02/09/2019. FINDINGS: NG tube was placed under fluoroscopic guidance without complication. Tube tip and side hole placed in the stomach. NG tube was taped to the nose and the patient sent back to the floor in good clinical condition. IMPRESSION: Successful fluoroscopically guided NG tube placement. Electronically Signed   By: [place]   On: 02/24/2019 10:08        Scheduled  Meds: . aspirin  81 mg Oral Daily  . B-complex with vitamin C  1 tablet Per Tube Daily  . chlorhexidine  15 mL Mouth Rinse BID  . Chlorhexidine Gluconate Cloth  6 each Topical Q0600  . epoetin (EPOGEN/PROCRIT) injection  4,000 Units Intravenous Q M,W,F-HD  . feeding supplement (PRO-STAT SUGAR FREE 64)  30 mL Per Tube Daily  . fluticasone  2 spray Each Nare Daily  . free water  30 mL Per Tube Q4H  . insulin aspart  0-9 Units Subcutaneous TID WC  . levothyroxine  88 mcg Oral QAC breakfast  . mouth rinse  15 mL Mouth Rinse q12n4p  . midodrine  10 mg Oral TID WC  . pantoprazole (PROTONIX) IV  40 mg Intravenous Q24H  . venlafaxine  75 mg Oral BID WC  . zinc sulfate  220 mg Oral Daily   Continuous Infusions: . sodium chloride 250 mL (02/24/19 2008)  . DAPTOmycin (CUBICIN)  IV 650 mg (02/24/19 2009)  . feeding supplement (OSMOLITE 1.5 CAL)    . heparin 800 Units/hr (02/24/19 1533)     LOS: 16 days   Time spent=25 mins    Adie Vilar Arsenio Loader, MD [facility]  If 7PM-7AM, please contact night-coverage  02/25/2019, 7:50 AM

## 2019-02-25 NOTE — Progress Notes (Signed)
Post HD Tx    02/25/19 1200  Hand-Off documentation  Report given to (Full Name) Ar[place]elle, RN   Report received from (Full Name) Beatris Ship, RN   Vital Signs  Temp 97.6 F (36.4 C)  Temp Source Oral  Pulse Rate 74  Pulse Rate Source Monitor  Resp 12  BP (!) 112/41  BP Location Left Leg  BP Method Automatic  Patient Position (if appropriate) Lying  Oxygen Therapy  SpO2 100 %  O2 Device Room Air  Pain Assessment  Pain Scale 0-10  Pain Score 0  Dialysis Weight  Weight 73.2 kg  Type of Weight Post-Dialysis  Post-Hemodialysis Assessment  Rinseback Volume (mL) 250 mL  KECN 68.3 V  Dialyzer Clearance Lightly streaked  Duration of HD Treatment -hour(s) 3 hour(s)  Hemodialysis Intake (mL) 500 mL  UF Total -Machine (mL) 1377 mL  Net UF (mL) 877 mL  Tolerated HD Treatment Yes  Fistula / Graft Right Upper arm Arteriovenous vein graft  Placement Date/Time: 02/03/16 1210   Placed prior to admission: No  Orientation: Right  Access Location: Upper arm  Access Type: Arteriovenous vein graft  Site Condition No complications  Fistula / Graft Assessment Present;Thrill;Bruit  Hemodialysis Catheter Left Internal jugular Double lumen Permanent (Tunneled)  Placement Date/Time: 02/23/19 1514   Time Out: Correct patient;Correct site;Correct procedure  Maximum sterile barrier precautions: Hand hygiene;Cap;Mask;Sterile gown;Sterile gloves;Large sterile sheet  Site Prep: Chlorhexidine (preferred)  Local Anes...  Site Condition No complications  Blue Lumen Status Heparin locked  Red Lumen Status Heparin locked  Post treatment catheter status Capped and Clamped

## 2019-02-25 NOTE — Consult Note (Signed)
ANTICOAGULATION CONSULT NOTE  Pharmacy Consult for heparin Indication: DVT   Patient Measurements: Height: 5\' 4"  (162.6 cm) Weight: 161 lb 13.1 oz (73.4 kg) IBW/kg (Calculated) : 54.7 Heparin Dosing Weight: 71 kg  Vital Signs: Temp: 98.1 F (36.7 C) (12/23 0428) Temp Source: Oral (12/23 0428) BP: 121/53 (12/23 0428) Pulse Rate: 69 (12/23 0428)  Labs: Recent Labs    02/23/19 0539 02/23/19 1000 02/24/19 0241 02/24/19 1250  HGB  --   --  6.8*  --   HCT  --   --  20.9*  --   PLT  --   --  153  --   HEPARINUNFRC  --  <0.10* 0.39 0.63  CREATININE 4.21*  --  2.45*  --   CKTOTAL 29*  --   --   --     Estimated Creatinine Clearance: 22.5 mL/min (A) (by C-G formula based on SCr of 2.45 mg/dL (H)).   Medical History: Past Medical History:  Diagnosis Date  . Anemia   . Chronic diastolic CHF (congestive heart failure) ([place])    a. Due to ischemic cardiomyopathy. EF as low as 35%, improved to normal s/p CABG; b. echo 07/06/13: EF 55-60%, no RWMAs, mod TR, trivial pericardial effusion not c/w tamponade physiology;  c. 10/2015 Echo: EF 65%, Gr1 DD, triv AI, mild MR, mildly dil LA, mod TR, PASP 77mmHg.  Marland Kitchen Coronary artery disease    a. NSTEMI 06/2013; b.cath: severe three-vessel CAD w/ EF 30% & mild-mod MR; c. s/p 3 vessel CABG 07/02/13 (LIMA-LAD, SVG-OM, and SVG-RPDA);  d. 10/2015 MV: no ischemia/infarct.  . Diabetes mellitus without complication ([place])   . Diabetic neuropathy ([place])   . Dialysis patient [facility])    MWF  . ESRD (end stage renal disease) ([place])    a. 12/2015 initiated - mwf dialysis.  Marland Kitchen GERD (gastroesophageal reflux disease)   . Hyperlipidemia   . Hypertension   . Hypothyroidism   . Myocardial infarction ([place]) 2015  . Neuropathy   . Pleural effusion 2015  . Pulmonary hypertension ([place])   . Renal insufficiency   . Wears dentures    full lower    Medications:  Scheduled:  . aspirin  81 mg Oral Daily  . B-complex with vitamin C  1 tablet Per Tube Daily  .  chlorhexidine  15 mL Mouth Rinse BID  . Chlorhexidine Gluconate Cloth  6 each Topical Q0600  . epoetin (EPOGEN/PROCRIT) injection  4,000 Units Intravenous Q M,W,F-HD  . feeding supplement (PRO-STAT SUGAR FREE 64)  30 mL Per Tube Daily  . fluticasone  2 spray Each Nare Daily  . free water  30 mL Per Tube Q4H  . insulin aspart  0-9 Units Subcutaneous TID WC  . levothyroxine  88 mcg Oral QAC breakfast  . mouth rinse  15 mL Mouth Rinse q12n4p  . midodrine  10 mg Oral TID WC  . pantoprazole (PROTONIX) IV  40 mg Intravenous Q24H  . venlafaxine  75 mg Oral BID WC  . zinc sulfate  220 mg Oral Daily    Assessment: 63 y.o. female admitted on 02/09/2019 with MRSA  bacteremia. She was on apixaban PTA but last dose was prior to admission (12/7). [place] 12/15 revealed age-indeterminate short segment occlusive DVT involving the mid humeral aspect of one of the paired brachial veins.  Heparin was started but after repeated attempts by multiple phlebotomists lab was unable to draw labs required for heparin monitoring. Additionally, she was too confused and disoriented to swallow oral  medications. On 12/20 a femoral central line was placed. She is having her AV fistula replaced today. She received a single dose of enoxaparin 12/18 and a heparin level this morning showed an untraceable level. Hgb is down to 6.8 this morning. Dr Priscella Mann is aware and has ordered a unit of PRBCs  Heparin Course: 12/21 initiation: 900 units/hr w/o bolus 12/22 0241 HL 0.39 therapeutic x 1 12/22 1250 HL 0.63: dec rate to 800 units/hr 12/23 0640 HL 0.57: no change  Goal of Therapy:  Heparin Level: 0.3 - 0.7 units/mL Monitor platelets by anticoagulation protocol: Yes   Plan:   continue heparin infusion at 800 units/hr  recheck an anti-Xa level in 6 hours   CBC in am   Dallie Piles, PharmD Clinical Pharmacist 02/25/2019 7:11 AM

## 2019-02-25 NOTE — Progress Notes (Signed)
Pre HD Tx   02/25/19 0842  Hand-Off documentation  Report given to (Full Name) Beatris Ship, RN   Report received from (Full Name) Arhmelle, RN   Vital Signs  Temp 98.1 F (36.7 C)  Temp Source Oral  Pulse Rate 69  Pulse Rate Source Monitor  Resp 12  BP (!) 143/46  BP Location Left Leg  BP Method Automatic  Patient Position (if appropriate) Lying  Oxygen Therapy  SpO2 100 %  O2 Device Room Air  Pulse Oximetry Type Continuous  Pain Assessment  Pain Scale 0-10  Pain Score 0  Dialysis Weight  Weight 74.1 kg  Type of Weight Pre-Dialysis  Time-Out for Hemodialysis  What Procedure? HD  Pt Identifiers(min of two) First/Last Name;MRN/Account#  Correct Site? Yes  Correct Side? Yes  Correct Procedure? Yes  Consents Verified? Yes  Rad Studies Available? N/A  Safety Precautions Reviewed? Yes  Engineer, civil (consulting) Number 1  Station Number 1  UF/Alarm Test Passed  Conductivity: Meter 14  Conductivity: Machine  14.1  pH 7.2  Reverse Osmosis Main  Normal Saline Lot Number [id number]  Dialyzer Lot Number [id number]  Disposable Set Lot Number [id number]  Machine Temperature 98.6 F (37 C)  Musician and Audible Yes  Blood Lines Intact and Secured Yes  Pre Treatment Patient Checks  Vascular access used during treatment Catheter  HD catheter dressing before treatment WDL  Hepatitis B Surface Antigen Results Negative  Date Hepatitis B Surface Antigen Drawn 02/06/19  Hepatitis B Surface Antibody  (>10)  Date Hepatitis B Surface Antibody Drawn 01/17/19  Hemodialysis Consent Verified Yes  Hemodialysis Standing Orders Initiated Yes  ECG (Telemetry) Monitor On Yes  Prime Ordered Normal Saline  Length of  DialysisTreatment -hour(s) 3 Hour(s)  Dialysis Treatment Comments Na 140  Dialyzer Elisio 17H NR  Dialysate 3K;2.5 Ca  Dialysis Anticoagulant None  Dialysate Flow Ordered 600  Blood Flow Rate Ordered 400 mL/min  Ultrafiltration Goal 1.5 Liters  Pre Treatment Labs Renal  panel;CBC  Dialysis Blood Pressure Support Ordered Normal Saline  Education / Care Plan  Dialysis Education Provided Yes  Documented Education in Care Plan Yes  Fistula / Graft Right Upper arm Arteriovenous vein graft  Placement Date/Time: 02/03/16 1210   Placed prior to admission: No  Orientation: Right  Access Location: Upper arm  Access Type: Arteriovenous vein graft  Site Condition No complications  Fistula / Graft Assessment Present;Thrill;Bruit  Status Patent

## 2019-02-26 ENCOUNTER — Inpatient Hospital Stay: Payer: Medicare Other

## 2019-02-26 LAB — COMPREHENSIVE METABOLIC PANEL
ALT: 20 U/L (ref 0–44)
AST: 26 U/L (ref 15–41)
Albumin: 2.1 g/dL — ABNORMAL LOW (ref 3.5–5.0)
Alkaline Phosphatase: 59 U/L (ref 38–126)
Anion gap: 15 (ref 5–15)
BUN: 9 mg/dL (ref 8–23)
CO2: 26 mmol/L (ref 22–32)
Calcium: 7.9 mg/dL — ABNORMAL LOW (ref 8.9–10.3)
Chloride: 100 mmol/L (ref 98–111)
Creatinine, Ser: 2.29 mg/dL — ABNORMAL HIGH (ref 0.44–1.00)
GFR calc Af Amer: 25 mL/min — ABNORMAL LOW (ref >60)
GFR calc non Af Amer: 22 mL/min — ABNORMAL LOW (ref >60)
Glucose, Bld: 198 mg/dL — ABNORMAL HIGH (ref 70–99)
Potassium: 3.3 mmol/L — ABNORMAL LOW (ref 3.5–5.1)
Sodium: 141 mmol/L (ref 135–145)
Total Bilirubin: 1.9 mg/dL — ABNORMAL HIGH (ref 0.3–1.2)
Total Protein: 4.9 g/dL — ABNORMAL LOW (ref 6.5–8.1)

## 2019-02-26 LAB — CBC
HCT: 29.1 % — ABNORMAL LOW (ref 36.0–46.0)
HCT: 27.2 % — ABNORMAL LOW (ref 36.0–46.0)
Hemoglobin: 9.4 g/dL — ABNORMAL LOW (ref 12.0–15.0)
Hemoglobin: 8.9 g/dL — ABNORMAL LOW (ref 12.0–15.0)
MCH: 29 pg (ref 26.0–34.0)
MCH: 29.2 pg (ref 26.0–34.0)
MCHC: 32.3 g/dL (ref 30.0–36.0)
MCHC: 32.7 g/dL (ref 30.0–36.0)
MCV: 89.8 fL (ref 80.0–100.0)
MCV: 89.2 fL (ref 80.0–100.0)
Platelets: 179 10*3/uL (ref 150–400)
Platelets: 155 10*3/uL (ref 150–400)
RBC: 3.24 MIL/uL — ABNORMAL LOW (ref 3.87–5.11)
RBC: 3.05 MIL/uL — ABNORMAL LOW (ref 3.87–5.11)
RDW: 19.7 % — ABNORMAL HIGH (ref 11.5–15.5)
RDW: 19.9 % — ABNORMAL HIGH (ref 11.5–15.5)
WBC: 6.7 10*3/uL (ref 4.0–10.5)
WBC: 5.8 10*3/uL (ref 4.0–10.5)
nRBC: 0.3 % — ABNORMAL HIGH (ref 0.0–0.2)
nRBC: 0 % (ref 0.0–0.2)

## 2019-02-26 LAB — FOLATE RBC
Folate, Hemolysate: 551 ng/mL
Folate, RBC: 1996 ng/mL (ref >498)
Hematocrit: 27.6 % — ABNORMAL LOW (ref 34.0–46.6)

## 2019-02-26 LAB — RENAL FUNCTION PANEL
Albumin: 2.1 g/dL — ABNORMAL LOW (ref 3.5–5.0)
Anion gap: 18 — ABNORMAL HIGH (ref 5–15)
BUN: 10 mg/dL (ref 8–23)
CO2: 22 mmol/L (ref 22–32)
Calcium: 7.8 mg/dL — ABNORMAL LOW (ref 8.9–10.3)
Chloride: 100 mmol/L (ref 98–111)
Creatinine, Ser: 2.25 mg/dL — ABNORMAL HIGH (ref 0.44–1.00)
GFR calc Af Amer: 26 mL/min — ABNORMAL LOW (ref >60)
GFR calc non Af Amer: 22 mL/min — ABNORMAL LOW (ref >60)
Glucose, Bld: 198 mg/dL — ABNORMAL HIGH (ref 70–99)
Phosphorus: 1.9 mg/dL — ABNORMAL LOW (ref 2.5–4.6)
Potassium: 3.4 mmol/L — ABNORMAL LOW (ref 3.5–5.1)
Sodium: 140 mmol/L (ref 135–145)

## 2019-02-26 LAB — GLUCOSE, CAPILLARY
Glucose-Capillary: 184 mg/dL — ABNORMAL HIGH (ref 70–99)
Glucose-Capillary: 173 mg/dL — ABNORMAL HIGH (ref 70–99)
Glucose-Capillary: 114 mg/dL — ABNORMAL HIGH (ref 70–99)
Glucose-Capillary: 138 mg/dL — ABNORMAL HIGH (ref 70–99)

## 2019-02-26 LAB — MAGNESIUM: Magnesium: 1.7 mg/dL (ref 1.7–2.4)

## 2019-02-26 LAB — HEPARIN LEVEL (UNFRACTIONATED): Heparin Unfractionated: 0.26 IU/mL — ABNORMAL LOW (ref 0.30–0.70)

## 2019-02-26 MED ORDER — MAGNESIUM SULFATE IN D5W 1-5 GM/100ML-% IV SOLN
1.0000 g | Freq: Once | INTRAVENOUS | Status: AC
  Administered 2019-02-26: 1 g via INTRAVENOUS
  Filled 2019-02-26: qty 100

## 2019-02-26 MED ORDER — POTASSIUM CHLORIDE 10 MEQ/100ML IV SOLN
10.0000 meq | INTRAVENOUS | Status: AC
  Administered 2019-02-26 (×2): 10 meq via INTRAVENOUS
  Filled 2019-02-26: qty 100

## 2019-02-26 MED ORDER — VANCOMYCIN HCL 750 MG/150ML IV SOLN
750.0000 mg | INTRAVENOUS | Status: AC
  Administered 2019-03-02 – 2019-04-01 (×12): 750 mg via INTRAVENOUS
  Filled 2019-02-26 (×15): qty 150

## 2019-02-26 MED ORDER — OSMOLITE 1.5 CAL PO LIQD
1000.0000 mL | ORAL | Status: DC
  Administered 2019-02-26 – 2019-03-02 (×5): 1000 mL

## 2019-02-26 MED ORDER — VANCOMYCIN HCL 1500 MG/300ML IV SOLN
1500.0000 mg | Freq: Once | INTRAVENOUS | Status: AC
  Administered 2019-02-26: 1500 mg via INTRAVENOUS
  Filled 2019-02-26: qty 300

## 2019-02-26 NOTE — Progress Notes (Signed)
I called family member, Shyanna Fukushima (Husband) to update about fall. He was calm and appropriate.

## 2019-02-26 NOTE — Consult Note (Signed)
Pharmacy Antibiotic Note  Jamie Arias is a 63 y.o. female admitted on 02/09/2019 with methicillin resistant Staphylococcus aureus  .  Pharmacy has been consulted for daptomycin dosing. This is day # 11 of IV antibioics, leukocytosis has resolved with no recent fevers. (per ID: Post covid morbidity- Long COVID)  Plan: Continue daptomycin 650 mg IV every 48 hours (Hemodialysis pt)  12/21 CK 29  Height: 5\' 4"  (162.6 cm) Weight: 161 lb 6 oz (73.2 kg) IBW/kg (Calculated) : 54.7  Temp (24hrs), Avg:97.9 F (36.6 C), Min:97.5 F (36.4 C), Max:98.5 F (36.9 C)  Recent Labs  Lab 02/21/19 1159 02/23/19 0539 02/24/19 0241 02/25/19 0640 02/25/19 0759 02/25/19 0845 02/26/19 0231  WBC 9.6  --  5.7  --  7.1 5.7 6.7  CREATININE 2.88* 4.21* 2.45* 3.35*  --   --  2.29*  2.25*    Estimated Creatinine Clearance: 24.4 mL/min (A) (by C-G formula based on SCr of 2.25 mg/dL (H)).    Antimicrobials this admission: ceftaroline 12/14 >> 12/21 daptomycin 12/14 >>   Microbiology results: 12/16 BCx: NG 12/15 WCx MRSA 12/13 BCx: MRSA   Thank you for allowing pharmacy to be a part of this patient's care.  Marjon Doxtater A 02/26/2019 7:33 AM

## 2019-02-26 NOTE — Progress Notes (Signed)
Patient to start back on heparin per Rufina Falco, MD.

## 2019-02-26 NOTE — Progress Notes (Signed)
Hd completed 

## 2019-02-26 NOTE — Care Management Important Message (Signed)
Important Message  Patient Details  Name: Jamie Arias MRN: [id number] Date of Birth: [birth year]   Medicare Important Message Given:  Yes     Ross Ludwig, LCSW 02/26/2019, 12:20 PM

## 2019-02-26 NOTE — Progress Notes (Signed)
OT Cancellation Note  Patient Details Name: Jamie Arias MRN: [id number] DOB: October 03, 1955   Cancelled Treatment:    Reason Eval/Treat Not Completed: Other (comment)  Per chart review, pt with rapid response called over night for fall from bed, now with skin tear to R arm. CT negative for bleed. Now with heparin being held d/t R arm skin tear. In addition, K+ 3.3. Will f/u for OT tx when pt more appropriate for therapy participation. Thank you.   Gerrianne Scale, [place], OTR/L ascom [phone] 02/26/19, 12:02 PM

## 2019-02-26 NOTE — Progress Notes (Signed)
Got paged for rapid response. On arrival found several staff members in room, patient on floor, bleeding from head. Was reported patient had fallen from bed to floor and hit head. Cspine stabilized for transfer back to bed.C-collar placed on patient.

## 2019-02-26 NOTE — Progress Notes (Signed)
SLP Cancellation Note  Patient Details Name: Jamie Arias MRN: [id number] DOB: 22-May-1955   Cancelled treatment:       Reason Eval/Treat Not Completed: Patient at procedure or test/unavailable(chart reviewed; pt out of room to HD).  Pt remains hospitalized w/ Prolonged hospitalization for MRSA bacteremia/sepsis and now with acute Encephalopathy. Fluoroscopically guided NG tube placement completed on 02/24/2019 d/t poor po intake. Recommend strict aspiration precautions; frequent Oral Care w/ enteral feedings ongoing.  ST services can be available for further needs w/ pt is more Cognitively appropriate for participation in therapy; po intake.     Orinda Kenner, MS, CCC-SLP [name] 02/26/2019, 2:48 PM

## 2019-02-26 NOTE — Progress Notes (Signed)
[facility]  ROUNDING NOTE   Subjective:   Slow to respond but answers to yes and no questions.   Hemodialysis treatment yesterday. Tolerated treatment well. UF of 823mL  Objective:  Vital signs in last 24 hours:  Temp:  [97.5 F (36.4 C)-98.5 F (36.9 C)] 98 F (36.7 C) (12/24 0756) Pulse Rate:  [65-76] 66 (12/24 0756) Resp:  [9-20] 9 (12/24 0756) BP: (120-157)/(41-63) 123/52 (12/24 0756) SpO2:  [98 %-100 %] 100 % (12/24 0756)  Weight change:  Filed Weights   02/23/19 1333 02/25/19 0842 02/25/19 1200  Weight: 73.4 kg 74.1 kg 73.2 kg    Intake/Output: I/O last 3 completed shifts: In: 360 [Blood:350; IV Piggyback:10] Out: 877 [Other:877]   Intake/Output this shift:  No intake/output data recorded.  Physical Exam: General: NAD, laying in bed  Head: OGT   Eyes: Anicteric, PERRL  Neck: Supple, trachea midline  Lungs:  Clear to auscultation  Heart: Regular rate and rhythm  Abdomen:  Soft, nontender,   Extremities: + peripheral edema.  Neurologic: Lethargic,responds to verbal stimuli.   Skin: No lesions  Access: Left AVG, bleeding over access    Basic Metabolic Panel: Recent Labs  Lab 02/19/19 2119 02/21/19 1159 02/22/19 0531 02/23/19 0539 02/24/19 0241 02/25/19 0640 02/26/19 0231  NA 142 141  --  143 140 140 141  140  K 3.0* 3.2* 3.1* 2.9* 3.3* 3.5 3.3*  3.4*  CL 100 101  --  106 104 105 100  100  CO2 28 21*  --  21* 25 21* 26  22  GLUCOSE 119* 184*  --  197* 188* 194* 198*  198*  BUN 11 10  --  17 9 16 9  10   CREATININE 3.29* 2.88*  --  4.21* 2.45* 3.35* 2.29*  2.25*  CALCIUM 8.4* 8.5*  --  8.0* 8.0* 8.1* 7.9*  7.8*  MG 1.9 1.7  --  1.8 2.0 2.1 1.7  PHOS 2.9  --   --  3.7 2.8 3.5 1.9*    Liver Function Tests: Recent Labs  Lab 02/19/19 2119 02/26/19 0231  AST  --  26  ALT  --  20  ALKPHOS  --  59  BILITOT  --  1.9*  PROT  --  4.9*  ALBUMIN 2.2* 2.1*  2.1*   No results for input(s): LIPASE, AMYLASE in the last 168  hours. Recent Labs  Lab 02/19/19 2119  AMMONIA 12    CBC: Recent Labs  Lab 02/21/19 1159 02/24/19 0241 02/25/19 0759 02/25/19 0845 02/26/19 0231  WBC 9.6 5.7 7.1 5.7 6.7  HGB 7.7* 6.8* 9.3* 9.0* 9.4*  HCT 24.3* 20.9* 28.0* 28.0* 29.1*  MCV 88.4 87.1 87.5 88.9 89.8  PLT 227 153 166 171 179    Cardiac Enzymes: Recent Labs  Lab 02/23/19 0539  CKTOTAL 29*    BNP: Invalid input(s): POCBNP  CBG: Recent Labs  Lab 02/25/19 0756 02/25/19 1402 02/25/19 1652 02/25/19 2148 02/26/19 0757  GLUCAP 165* 86 117* 170* 184*    Microbiology: Results for orders placed or performed during the hospital encounter of 02/09/19  Culture, blood (routine x 2)     Status: Abnormal   Collection Time: 02/09/19  2:29 PM   Specimen: BLOOD LEFT ARM  Result Value Ref Range Status   Specimen Description   Final    BLOOD LEFT ARM Performed at [facility], [address] [place], [place] [address]    Special Requests   Final    BOTTLES  DRAWN AEROBIC AND ANAEROBIC Blood Culture adequate volume Performed at [facility], [address] [place], [place] [address]    Culture  Setup Time   Final    GRAM POSITIVE COCCI IN BOTH AEROBIC AND ANAEROBIC BOTTLES CRITICAL VALUE NOTED.  VALUE IS CONSISTENT WITH PREVIOUSLY REPORTED AND CALLED VALUE. Performed at [facility], [address] [place], [place] [address]    Culture (A)  Final    STAPHYLOCOCCUS AUREUS SUSCEPTIBILITIES PERFORMED ON PREVIOUS CULTURE WITHIN THE LAST 5 DAYS. Performed at [facility], [place] [address], [place], [place] [address]    Report Status 02/12/2019 FINAL  Final  Culture, blood (routine x 2)     Status: Abnormal   Collection Time: 02/09/19  2:38 PM   Specimen: BLOOD LEFT ARM  Result Value Ref Range Status   Specimen Description   Final    BLOOD LEFT ARM Performed at [facility], [address] [place], [place] [address]    Special Requests   Final    BOTTLES  DRAWN AEROBIC AND ANAEROBIC Blood Culture adequate volume Performed at [facility], [address] [place], [place] [address]    Culture  Setup Time   Final    GRAM POSITIVE COCCI IN BOTH AEROBIC AND ANAEROBIC BOTTLES CRITICAL RESULT CALLED TO, READ BACK BY AND VERIFIED WITH: Barnhart [id number] 02/10/2019 HNM Performed at [facility], [place] [address], [place], [place] [address]    Culture METHICILLIN RESISTANT STAPHYLOCOCCUS AUREUS (A)  Final   Report Status 02/12/2019 FINAL  Final   Organism ID, Bacteria METHICILLIN RESISTANT STAPHYLOCOCCUS AUREUS  Final      Susceptibility   Methicillin resistant staphylococcus aureus - MIC*    CIPROFLOXACIN >=8 RESISTANT Resistant     ERYTHROMYCIN >=8 RESISTANT Resistant     GENTAMICIN <=0.5 SENSITIVE Sensitive     OXACILLIN >=4 RESISTANT Resistant     TETRACYCLINE <=1 SENSITIVE Sensitive     VANCOMYCIN 1 SENSITIVE Sensitive     TRIMETH/SULFA <=10 SENSITIVE Sensitive     CLINDAMYCIN RESISTANT Resistant     RIFAMPIN <=0.5 SENSITIVE Sensitive     Inducible Clindamycin POSITIVE Resistant     * METHICILLIN RESISTANT STAPHYLOCOCCUS AUREUS  Blood Culture ID Panel (Reflexed)     Status: Abnormal   Collection Time: 02/09/19  2:38 PM  Result Value Ref Range Status   Enterococcus species NOT DETECTED NOT DETECTED Final   Listeria monocytogenes NOT DETECTED NOT DETECTED Final   Staphylococcus species DETECTED (A) NOT DETECTED Final    Comment: CRITICAL RESULT CALLED TO, READ BACK BY AND VERIFIED WITH: Hart Robinsons PHARMD [id number] 02/10/2019 HNM    Staphylococcus aureus (BCID) DETECTED (A) NOT DETECTED Final    Comment: Methicillin (oxacillin)-resistant Staphylococcus aureus (MRSA). MRSA is predictably resistant to beta-lactam antibiotics (except ceftaroline). Preferred therapy is vancomycin unless clinically contraindicated. Patient requires contact precautions if  hospitalized. CRITICAL RESULT CALLED TO, READ BACK BY AND VERIFIED  WITH: Hart Robinsons PHARMD [id number] 02/10/2019 HNM    Methicillin resistance DETECTED (A) NOT DETECTED Final    Comment: CRITICAL RESULT CALLED TO, READ BACK BY AND VERIFIED WITH: Hart Robinsons PHARMD [id number] 02/10/2019 HNM    Streptococcus species NOT DETECTED NOT DETECTED Final   Streptococcus agalactiae NOT DETECTED NOT DETECTED Final   Streptococcus pneumoniae NOT DETECTED NOT DETECTED Final   Streptococcus pyogenes NOT DETECTED NOT DETECTED Final   Acinetobacter baumannii NOT DETECTED NOT DETECTED Final   Enterobacteriaceae species NOT DETECTED NOT DETECTED  Final   Enterobacter cloacae complex NOT DETECTED NOT DETECTED Final   Escherichia coli NOT DETECTED NOT DETECTED Final   Klebsiella oxytoca NOT DETECTED NOT DETECTED Final   Klebsiella pneumoniae NOT DETECTED NOT DETECTED Final   Proteus species NOT DETECTED NOT DETECTED Final   Serratia marcescens NOT DETECTED NOT DETECTED Final   Haemophilus influenzae NOT DETECTED NOT DETECTED Final   Neisseria meningitidis NOT DETECTED NOT DETECTED Final   Pseudomonas aeruginosa NOT DETECTED NOT DETECTED Final   Candida albicans NOT DETECTED NOT DETECTED Final   Candida glabrata NOT DETECTED NOT DETECTED Final   Candida krusei NOT DETECTED NOT DETECTED Final   Candida parapsilosis NOT DETECTED NOT DETECTED Final   Candida tropicalis NOT DETECTED NOT DETECTED Final    Comment: Performed at [facility], [place], [place] [address]  SARS CORONAVIRUS 2 (TAT 6-24 HRS) Nasopharyngeal Nasopharyngeal Swab     Status: None   Collection Time: 02/09/19  2:54 PM   Specimen: Nasopharyngeal Swab  Result Value Ref Range Status   SARS Coronavirus 2 NEGATIVE NEGATIVE Final    Comment: (NOTE) SARS-CoV-2 target nucleic acids are NOT DETECTED. The SARS-CoV-2 RNA is generally detectable in upper and lower respiratory specimens during the acute phase of infection. Negative results do not preclude SARS-CoV-2 infection, do not rule  out co-infections with other pathogens, and should not be used as the sole basis for treatment or other patient management decisions. Negative results must be combined with clinical observations, patient history, and epidemiological information. The expected result is Negative. Fact Sheet for Patients: SugarRoll.be Fact Sheet for Healthcare Providers: https://www.woods-mathews.com/ This test is not yet approved or cleared by the [place] FDA and  has been authorized for detection and/or diagnosis of SARS-CoV-2 by FDA under an Emergency Use Authorization (EUA). This EUA will remain  in effect (meaning this test can be used) for the duration of the COVID-19 declaration under Section 56 4(b)(1) of the Act, 21 U.S.C. section 360bbb-3(b)(1), unless the authorization is terminated or revoked sooner. Performed at [facility], [place] [address] [place], [place] [address]   MRSA PCR Screening     Status: Abnormal   Collection Time: 02/10/19  5:03 PM   Specimen: Nasopharyngeal  Result Value Ref Range Status   MRSA by PCR POSITIVE (A) NEGATIVE Final    Comment:        The GeneXpert MRSA Assay (FDA approved for NASAL specimens only), is one component of a comprehensive MRSA colonization surveillance program. It is not intended to diagnose MRSA infection nor to guide or monitor treatment for MRSA infections. RESULT CALLED TO, READ BACK BY AND VERIFIED WITH: ALEKSEY FRASER @1839  02/10/19 MJU Performed at [facility], [address] [place], [place] [address]   Culture, blood (routine x 2)     Status: Abnormal   Collection Time: 02/11/19 12:31 PM   Specimen: BLOOD  Result Value Ref Range Status   Specimen Description   Final    BLOOD PORTA CATH Performed at [facility] [address], [place], [place] [address]    Special Requests   Final    BOTTLES DRAWN AEROBIC AND ANAEROBIC Blood Culture adequate  volume Performed at [facility], [address] [place], [place] [address]    Culture  Setup Time   Final    GRAM POSITIVE COCCI ANAEROBIC BOTTLE ONLY CRITICAL RESULT CALLED TO, READ BACK BY AND VERIFIED WITH: SCOTT HALL AT [id number] 02/12/2019 SNG  Performed  at [facility], [address] [place], [place] [address]    Culture (A)  Final    STAPHYLOCOCCUS AUREUS SUSCEPTIBILITIES PERFORMED ON PREVIOUS CULTURE WITHIN THE LAST 5 DAYS. Performed at [facility], [place] [address], [place], [place] [address]    Report Status 02/13/2019 FINAL  Final  Culture, blood (routine x 2)     Status: None   Collection Time: 02/11/19  3:57 PM   Specimen: BLOOD  Result Value Ref Range Status   Specimen Description BLOOD PORTA CATH  Final   Special Requests   Final    BOTTLES DRAWN AEROBIC AND ANAEROBIC Blood Culture adequate volume   Culture   Final    NO GROWTH 5 DAYS Performed at [facility], [address] [place], [place] [address]    Report Status 02/16/2019 FINAL  Final  CULTURE, BLOOD (ROUTINE X 2) w Reflex to ID Panel     Status: Abnormal   Collection Time: 02/13/19  2:18 PM   Specimen: BLOOD LEFT HAND  Result Value Ref Range Status   Specimen Description BLOOD LEFT HAND  Final   Special Requests   Final    BOTTLES DRAWN AEROBIC AND ANAEROBIC Blood Culture results may not be optimal due to an inadequate volume of blood received in culture bottles   Culture  Setup Time   Final    AEROBIC BOTTLE ONLY GRAM POSITIVE COCCI IN CLUSTERS CRITICAL RESULT CALLED TO, READ BACK BY AND VERIFIED WITH: Herbert Pun AT [id number] ON 02/14/2019 [place]. Performed at [facility], [place] [address] [place], [place] [address]    Culture METHICILLIN RESISTANT STAPHYLOCOCCUS AUREUS (A)  Final   Report Status 02/16/2019 FINAL  Final   Organism ID, Bacteria METHICILLIN RESISTANT STAPHYLOCOCCUS AUREUS  Final      Susceptibility   Methicillin resistant staphylococcus aureus -  MIC*    CIPROFLOXACIN >=8 RESISTANT Resistant     ERYTHROMYCIN >=8 RESISTANT Resistant     GENTAMICIN <=0.5 SENSITIVE Sensitive     OXACILLIN >=4 RESISTANT Resistant     TETRACYCLINE <=1 SENSITIVE Sensitive     VANCOMYCIN <=0.5 SENSITIVE Sensitive     TRIMETH/SULFA <=10 SENSITIVE Sensitive     CLINDAMYCIN RESISTANT Resistant     RIFAMPIN <=0.5 SENSITIVE Sensitive     Inducible Clindamycin POSITIVE Resistant     * METHICILLIN RESISTANT STAPHYLOCOCCUS AUREUS  Blood Culture ID Panel (Reflexed)     Status: Abnormal   Collection Time: 02/13/19  2:18 PM  Result Value Ref Range Status   Enterococcus species NOT DETECTED NOT DETECTED Final   Listeria monocytogenes NOT DETECTED NOT DETECTED Final   Staphylococcus species DETECTED (A) NOT DETECTED Final    Comment: CRITICAL RESULT CALLED TO, READ BACK BY AND VERIFIED WITH: ABBEY ELLINGTON AT [id number] ON 02/14/2019 [place].    Staphylococcus aureus (BCID) DETECTED (A) NOT DETECTED Final    Comment: Methicillin (oxacillin)-resistant Staphylococcus aureus (MRSA). MRSA is predictably resistant to beta-lactam antibiotics (except ceftaroline). Preferred therapy is vancomycin unless clinically contraindicated. Patient requires contact precautions if  hospitalized. CRITICAL RESULT CALLED TO, READ BACK BY AND VERIFIED WITH: Herbert Pun AT [id number] ON 02/14/2019 [place].    Methicillin resistance DETECTED (A) NOT DETECTED Final    Comment: CRITICAL RESULT CALLED TO, READ BACK BY AND VERIFIED WITH: Herbert Pun AT [id number] ON 02/14/2019 [place].    Streptococcus species NOT DETECTED NOT DETECTED Final   Streptococcus agalactiae NOT DETECTED NOT DETECTED Final   Streptococcus pneumoniae NOT DETECTED NOT DETECTED  Final   Streptococcus pyogenes NOT DETECTED NOT DETECTED Final   Acinetobacter baumannii NOT DETECTED NOT DETECTED Final   Enterobacteriaceae species NOT DETECTED NOT DETECTED Final   Enterobacter cloacae complex NOT DETECTED NOT DETECTED Final   Escherichia  coli NOT DETECTED NOT DETECTED Final   Klebsiella oxytoca NOT DETECTED NOT DETECTED Final   Klebsiella pneumoniae NOT DETECTED NOT DETECTED Final   Proteus species NOT DETECTED NOT DETECTED Final   Serratia marcescens NOT DETECTED NOT DETECTED Final   Haemophilus influenzae NOT DETECTED NOT DETECTED Final   Neisseria meningitidis NOT DETECTED NOT DETECTED Final   Pseudomonas aeruginosa NOT DETECTED NOT DETECTED Final   Candida albicans NOT DETECTED NOT DETECTED Final   Candida glabrata NOT DETECTED NOT DETECTED Final   Candida krusei NOT DETECTED NOT DETECTED Final   Candida parapsilosis NOT DETECTED NOT DETECTED Final   Candida tropicalis NOT DETECTED NOT DETECTED Final    Comment: Performed at [facility], [address] [place], [place] [address]  CULTURE, BLOOD (ROUTINE X 2) w Reflex to ID Panel     Status: None   Collection Time: 02/13/19  3:09 PM   Specimen: BLOOD  Result Value Ref Range Status   Specimen Description BLOOD BLOOD LEFT HAND  Final   Special Requests   Final    BOTTLES DRAWN AEROBIC AND ANAEROBIC Blood Culture adequate volume   Culture   Final    NO GROWTH 5 DAYS Performed at [facility], [address], [place], [place] [address]    Report Status 02/18/2019 FINAL  Final  Culture, blood (routine x 2)     Status: Abnormal   Collection Time: 02/15/19 12:49 PM   Specimen: BLOOD  Result Value Ref Range Status   Specimen Description   Final    BLOOD LT HAND Performed at [facility], [address] [place], [place] [address]    Special Requests   Final    BOTTLES DRAWN AEROBIC AND ANAEROBIC Blood Culture results may not be optimal due to an inadequate volume of blood received in culture bottles Performed at [facility], [address] [place],  [place] [address]    Culture  Setup Time   Final    GRAM POSITIVE COCCI IN BOTH AEROBIC AND ANAEROBIC BOTTLES CRITICAL RESULT CALLED TO, READ BACK BY AND VERIFIED  WITHEleonore Chiquito AT [id number] 02/16/2019 SDR Performed at [facility], [address] [place], [place] [address]    Culture (A)  Final    METHICILLIN RESISTANT STAPHYLOCOCCUS AUREUS STAPHYLOCOCCUS SPECIES (COAGULASE NEGATIVE) THE SIGNIFICANCE OF ISOLATING THIS ORGANISM FROM A SINGLE SET OF BLOOD CULTURES WHEN MULTIPLE SETS ARE DRAWN IS UNCERTAIN. PLEASE NOTIFY THE MICROBIOLOGY DEPARTMENT WITHIN ONE WEEK IF SPECIATION AND SENSITIVITIES ARE REQUIRED. Performed at [facility], [place] [address] [place],  [address]    Report Status 02/18/2019 FINAL  Final   Organism ID, Bacteria METHICILLIN RESISTANT STAPHYLOCOCCUS AUREUS  Final      Susceptibility   Methicillin resistant staphylococcus aureus - MIC*    CIPROFLOXACIN >=8 RESISTANT Resistant     ERYTHROMYCIN >=8 RESISTANT Resistant     GENTAMICIN <=0.5 SENSITIVE Sensitive     OXACILLIN >=4 RESISTANT Resistant     TETRACYCLINE <=1 SENSITIVE Sensitive     VANCOMYCIN <=0.5 SENSITIVE Sensitive     TRIMETH/SULFA <=10 SENSITIVE Sensitive     CLINDAMYCIN RESISTANT Resistant     RIFAMPIN <=0.5 SENSITIVE Sensitive     Inducible Clindamycin POSITIVE Resistant     *  METHICILLIN RESISTANT STAPHYLOCOCCUS AUREUS  Aerobic Culture (superficial specimen)     Status: None   Collection Time: 02/17/19  1:31 PM   Specimen: Heel; Wound  Result Value Ref Range Status   Specimen Description   Final    HEEL Performed at [facility], [address] [place], [place] [address]    Special Requests   Final    NONE Performed at [facility], [address] [place], [place] [address]    Gram Stain   Final    NO WBC SEEN MODERATE GRAM POSITIVE COCCI IN PAIRS    Culture   Final    ABUNDANT METHICILLIN RESISTANT STAPHYLOCOCCUS AUREUS ABUNDANT DIPHTHEROIDS(CORYNEBACTERIUM SPECIES) Standardized susceptibility testing for this organism is not available. Performed at [facility], [place] [address], [place],  [place] [address]    Report Status 02/19/2019 FINAL  Final   Organism ID, Bacteria METHICILLIN RESISTANT STAPHYLOCOCCUS AUREUS  Final      Susceptibility   Methicillin resistant staphylococcus aureus - MIC*    CIPROFLOXACIN >=8 RESISTANT Resistant     ERYTHROMYCIN >=8 RESISTANT Resistant     GENTAMICIN <=0.5 SENSITIVE Sensitive     OXACILLIN >=4 RESISTANT Resistant     TETRACYCLINE <=1 SENSITIVE Sensitive     VANCOMYCIN 1 SENSITIVE Sensitive     TRIMETH/SULFA <=10 SENSITIVE Sensitive     CLINDAMYCIN RESISTANT Resistant     RIFAMPIN <=0.5 SENSITIVE Sensitive     Inducible Clindamycin POSITIVE Resistant     * ABUNDANT METHICILLIN RESISTANT STAPHYLOCOCCUS AUREUS  CULTURE, BLOOD (ROUTINE X 2) w Reflex to ID Panel     Status: None   Collection Time: 02/18/19 11:29 PM   Specimen: BLOOD  Result Value Ref Range Status   Specimen Description BLOOD LEFT ARM  Final   Special Requests   Final    BOTTLES DRAWN AEROBIC AND ANAEROBIC Blood Culture adequate volume   Culture   Final    NO GROWTH 5 DAYS Performed at [facility], [address] [place], [place] [address]    Report Status 02/23/2019 FINAL  Final  CULTURE, BLOOD (ROUTINE X 2) w Reflex to ID Panel     Status: None   Collection Time: 02/18/19 11:29 PM   Specimen: BLOOD  Result Value Ref Range Status   Specimen Description BLOOD LEFT ARM  Final   Special Requests   Final    BOTTLES DRAWN AEROBIC AND ANAEROBIC Blood Culture adequate volume   Culture   Final    NO GROWTH 5 DAYS Performed at [facility], [address], [place], [place] [address]    Report Status 02/23/2019 FINAL  Final    Coagulation Studies: No results for input(s): LABPROT, INR in the last 72 hours.  Urinalysis: No results for input(s): COLORURINE, LABSPEC, PHURINE, GLUCOSEU, HGBUR, BILIRUBINUR, KETONESUR, PROTEINUR, UROBILINOGEN, NITRITE, LEUKOCYTESUR in the last 72 hours.  Invalid input(s): APPERANCEUR    Imaging: CT HEAD WO  CONTRAST  Result Date: 02/26/2019 CLINICAL DATA:  Head trauma EXAM: CT HEAD WITHOUT CONTRAST CT CERVICAL SPINE WITHOUT CONTRAST TECHNIQUE: Multidetector CT imaging of the head and cervical spine was performed following the standard protocol without intravenous contrast. Multiplanar CT image reconstructions of the cervical spine were also generated. COMPARISON:  None. FINDINGS: CT HEAD FINDINGS Brain: There is no mass, hemorrhage or extra-axial collection. There is generalized atrophy without lobar predilection. There is hypoattenuation of the periventricular white matter, most commonly indicating chronic ischemic microangiopathy. Vascular: Atherosclerotic calcification of the internal carotid  arteries at the skull base. No abnormal hyperdensity of the major intracranial arteries or dural venous sinuses. Skull: The visualized skull base, calvarium and extracranial soft tissues are normal. Sinuses/Orbits: No fluid levels or advanced mucosal thickening of the visualized paranasal sinuses. No mastoid or middle ear effusion. The orbits are normal. CT CERVICAL SPINE FINDINGS Alignment: No static subluxation. Facets are aligned. Occipital condyles are normally positioned. Skull base and vertebrae: No acute fracture. Soft tissues and spinal canal: No prevertebral fluid or swelling. No visible canal hematoma. Disc levels: No advanced spinal canal or neural foraminal stenosis. Upper chest: No pneumothorax, pulmonary nodule or pleural effusion. Other: Normal visualized paraspinal cervical soft tissues. IMPRESSION: 1. Generalized atrophy and chronic ischemic microangiopathy without acute intracranial abnormality. 2. No acute fracture or static subluxation of the cervical spine. Electronically Signed   By: Ulyses Jarred M.D.   On: 02/26/2019 03:04   CT CERVICAL SPINE WO CONTRAST  Result Date: 02/26/2019 CLINICAL DATA:  Head trauma EXAM: CT HEAD WITHOUT CONTRAST CT CERVICAL SPINE WITHOUT CONTRAST TECHNIQUE: Multidetector  CT imaging of the head and cervical spine was performed following the standard protocol without intravenous contrast. Multiplanar CT image reconstructions of the cervical spine were also generated. COMPARISON:  None. FINDINGS: CT HEAD FINDINGS Brain: There is no mass, hemorrhage or extra-axial collection. There is generalized atrophy without lobar predilection. There is hypoattenuation of the periventricular white matter, most commonly indicating chronic ischemic microangiopathy. Vascular: Atherosclerotic calcification of the internal carotid arteries at the skull base. No abnormal hyperdensity of the major intracranial arteries or dural venous sinuses. Skull: The visualized skull base, calvarium and extracranial soft tissues are normal. Sinuses/Orbits: No fluid levels or advanced mucosal thickening of the visualized paranasal sinuses. No mastoid or middle ear effusion. The orbits are normal. CT CERVICAL SPINE FINDINGS Alignment: No static subluxation. Facets are aligned. Occipital condyles are normally positioned. Skull base and vertebrae: No acute fracture. Soft tissues and spinal canal: No prevertebral fluid or swelling. No visible canal hematoma. Disc levels: No advanced spinal canal or neural foraminal stenosis. Upper chest: No pneumothorax, pulmonary nodule or pleural effusion. Other: Normal visualized paraspinal cervical soft tissues. IMPRESSION: 1. Generalized atrophy and chronic ischemic microangiopathy without acute intracranial abnormality. 2. No acute fracture or static subluxation of the cervical spine. Electronically Signed   By: Ulyses Jarred M.D.   On: 02/26/2019 03:04     Medications:   . sodium chloride 10 mL/hr at 02/25/19 1855  . DAPTOmycin (CUBICIN)  IV 650 mg (02/24/19 2009)  . feeding supplement (OSMOLITE 1.5 CAL) 1,000 mL (02/26/19 1206)  . heparin 800 Units/hr (02/26/19 0923)  . magnesium sulfate bolus IVPB    . potassium chloride 10 mEq (02/26/19 1205)  . vancomycin    . [START  ON 02/27/2019] vancomycin     . aspirin  81 mg Oral Daily  . B-complex with vitamin C  1 tablet Per Tube Daily  . chlorhexidine  15 mL Mouth Rinse BID  . Chlorhexidine Gluconate Cloth  6 each Topical Q0600  . epoetin (EPOGEN/PROCRIT) injection  4,000 Units Intravenous Q M,W,F-HD  . feeding supplement (PRO-STAT SUGAR FREE 64)  30 mL Per Tube Daily  . fluticasone  2 spray Each Nare Daily  . free water  30 mL Per Tube Q4H  . insulin aspart  0-9 Units Subcutaneous TID WC  . levothyroxine  88 mcg Oral QAC breakfast  . mouth rinse  15 mL Mouth Rinse q12n4p  . midodrine  10 mg Oral TID WC  .  pantoprazole (PROTONIX) IV  40 mg Intravenous Q24H  . venlafaxine  75 mg Oral BID WC  . zinc sulfate  220 mg Oral Daily   sodium chloride, haloperidol lactate, ondansetron (ZOFRAN) IV  Assessment/ Plan:  [name] is a 63 y.o. white female with end-stage renal disease on hemodialysis, atrial fibrillation, chronic diastolic congestive heart failure, coronary artery disease status post CABG, diabetes mellitus type II, hypertension, hyperlipidemia, hypothyroidism, Covid positive in July 2020, recent nursing home stay in [place] Prolonged hospitalization for MRSA bacteremia/sepsis and now with acute encephalopathy  [facility] MWF [facility] [address] Left AVG 81.5kg  1. End Stage Renal Disease: on hemodialysis. With complication of dialysis device. Angiogram on 12/21 shows no communication with bleed to AVG.  Placed tunneled catheter until her AVG site has healed. - Dialysis for later today and then resume MWF schedule next week. Orders prepared.   2. Hypotension - midodrine  3. Anemia of chronic kidney disease:   Status post PRBC transfusion on 12/22 - EPO with HD treatments.   4. Secondary Hyperparathyroidism: not currently on binders.   5. Acute Encephalopathy: improving off ceftaroline.  - Appreciate neurology input.    LOS: 17 Keishawna Carranza 12/24/202012:19 PM

## 2019-02-26 NOTE — Consult Note (Signed)
ANTICOAGULATION CONSULT NOTE  Pharmacy Consult for heparin Indication: DVT  Patient Measurements: Height: 5\' 4"  (162.6 cm) Weight: 161 lb 6 oz (73.2 kg) IBW/kg (Calculated) : 54.7 Heparin Dosing Weight: 71 kg  Vital Signs: Temp: 98 F (36.7 C) (12/24 1238) Temp Source: Oral (12/24 1238) BP: 140/61 (12/24 1345) Pulse Rate: 77 (12/24 1430)  Labs: Recent Labs    02/24/19 0241 02/24/19 1250 02/25/19 0640 02/25/19 0845 02/25/19 1624 02/26/19 0231 02/26/19 1413  HGB 6.8*   < >  --  9.0*  --  9.4* 8.9*  HCT 20.9*   < >  --  28.0*  --  29.1* 27.2*  PLT 153   < >  --  171  --  179 155  HEPARINUNFRC 0.39  --  0.57  --  0.48 0.26*  --   CREATININE 2.45*  --  3.35*  --   --  2.29*  2.25*  --    < > = values in this interval not displayed.   Estimated Creatinine Clearance: 24.4 mL/min (A) (by C-G formula based on SCr of 2.25 mg/dL (H)).  Medical History: Past Medical History:  Diagnosis Date  . Anemia   . Chronic diastolic CHF (congestive heart failure) ([place])    a. Due to ischemic cardiomyopathy. EF as low as 35%, improved to normal s/p CABG; b. echo 07/06/13: EF 55-60%, no RWMAs, mod TR, trivial pericardial effusion not c/w tamponade physiology;  c. 10/2015 Echo: EF 65%, Gr1 DD, triv AI, mild MR, mildly dil LA, mod TR, PASP 64mmHg.  Marland Kitchen Coronary artery disease    a. NSTEMI 06/2013; b.cath: severe three-vessel CAD w/ EF 30% & mild-mod MR; c. s/p 3 vessel CABG 07/02/13 (LIMA-LAD, SVG-OM, and SVG-RPDA);  d. 10/2015 MV: no ischemia/infarct.  . Diabetes mellitus without complication ([place])   . Diabetic neuropathy ([place])   . Dialysis patient [facility])    MWF  . ESRD (end stage renal disease) ([place])    a. 12/2015 initiated - mwf dialysis.  Marland Kitchen GERD (gastroesophageal reflux disease)   . Hyperlipidemia   . Hypertension   . Hypothyroidism   . Myocardial infarction ([place]) 2015  . Neuropathy   . Pleural effusion 2015  . Pulmonary hypertension ([place])   . Renal insufficiency   . Wears dentures     full lower    Medications:  Scheduled:  . aspirin  81 mg Oral Daily  . B-complex with vitamin C  1 tablet Per Tube Daily  . chlorhexidine  15 mL Mouth Rinse BID  . Chlorhexidine Gluconate Cloth  6 each Topical Q0600  . epoetin (EPOGEN/PROCRIT) injection  4,000 Units Intravenous Q M,W,F-HD  . feeding supplement (PRO-STAT SUGAR FREE 64)  30 mL Per Tube Daily  . fluticasone  2 spray Each Nare Daily  . free water  30 mL Per Tube Q4H  . insulin aspart  0-9 Units Subcutaneous TID WC  . levothyroxine  88 mcg Oral QAC breakfast  . mouth rinse  15 mL Mouth Rinse q12n4p  . midodrine  10 mg Oral TID WC  . pantoprazole (PROTONIX) IV  40 mg Intravenous Q24H  . venlafaxine  75 mg Oral BID WC  . zinc sulfate  220 mg Oral Daily    Assessment: 63 y.o. female admitted on 02/09/2019 with MRSA  bacteremia. She was on apixaban PTA but last dose was prior to admission (12/7). [place] 12/15 revealed age-indeterminate short segment occlusive DVT involving the mid humeral aspect of one of the paired brachial veins.  Heparin was started but after repeated attempts by multiple phlebotomists lab was unable to draw labs required for heparin monitoring. Additionally, she was too confused and disoriented to swallow oral medications. On 12/20 a femoral central line was placed. She is having her AV fistula replaced today. She received a single dose of enoxaparin 12/18 and a heparin level this morning showed an untraceable level. Hgb is down to 6.8 this morning. Dr Priscella Mann is aware and has ordered a unit of PRBCs -Hemodialysis pt  Heparin Course: 12/21 initiation: 900 units/hr w/o bolus 12/22 0241 HL 0.39 therapeutic x 1 12/22 1250 HL 0.63: dec rate to 800 units/hr 12/23 0640 HL 0.57: no change 12/23 1624 HL 0.48: no change 12/24 0231 HL 0.26: heparin has been on hold due to unwitnessed fall and bleeding from arm  Goal of Therapy:  Heparin Level: 0.3 - 0.7 units/mL Monitor platelets by anticoagulation protocol: Yes     Plan:   Heparin restarted this am at 800 units/hr. Will check HL in 8 hours. Hgb 8.9  Plt 155  CBC in am  Noralee Space, PharmD Clinical Pharmacist 02/26/2019 3:04 PM

## 2019-02-26 NOTE — Progress Notes (Addendum)
    BRIEF OVERNIGHT PROGRESS REPORT  ADDENDUM: Patient re-assessed this am. No further bleeding from the left arm, dressing changed Hematoma appear to be resolving. Will re-start Heparin gtt and monitor for any further bleeding. Repeat CBC pending.   SUBJECTIVE: Per nursing staff, patient had unwitnessed fall from the bed. Rapid response was initiated.  OBJECTIVE:On arrival to the bedside, patient was on the floor with staff attempting to lift him off the bed. She was bleeding from skin tear to her right arm otherwise no obvious trauma to head. Cervical spine was stabilized and patient placed back on the bed. She was afebrile with blood pressure 120/44 mm Hg and pulse rate 65 beats/min. There were no focal neurological deficits; she was alert to self only.  ASSESSMENT:63 year old with history of ESRD on HD, hypothyroidism, DM 2, admitted to the hospital for severe septic shock,  encephalopathic found to have MRSA bacteremia.  PLAN: 1. Traumatic Fall -  Unwitnessed fall causing significant bleeding from right arm - STAT CT head and Cervical Spine obtained which showed no acute intracranial abnormality. - Cervical collar removed has no injury noted on CT and patient denies neck pain - Will hold off Heparin gtt for now given large hematoma and bleeding from the left arm, will repeat labs in the am and continue to monitor for any neuro changes or signs of internal bleeding. Will consider re-starting Heparin gtt if patient stable. - Restart Tube Feed - Continue to trend labs - Frequent Neurocheck per protocol    Rufina Falco, DNP, CCRN, FNP-C [facility] Nurse Practitioner Between 7pm to 7am - Pager [phone]  After 7am go to [email or web address] - password:[email or web address]  [facility] [employer]  [phone]

## 2019-02-26 NOTE — Progress Notes (Signed)
This note also relates to the following rows which could not be included: Pulse Rate - Cannot attach notes to unvalidated device data Resp - Cannot attach notes to unvalidated device data  Hd started  

## 2019-02-26 NOTE — Progress Notes (Signed)
   02/26/19 0200  Clinical Encounter Type  Visited With Patient not available;Health care provider  Visit Type Follow-up;Code  Referral From Nurse   RR page received for this patient in the wake of a fall. Upon arrival, the medical team was assessing the patient and administering care. Chaplain maintained pastoral presence outside of the patient's room, offering silent prayer. Chaplain encouraged staff to be back in touch if support needed.

## 2019-02-26 NOTE — Progress Notes (Signed)
Patient sustained a fall while staff was providing daily cares. It was unwitnessed as to how she fell. Immediate care was provided. Rapid response was called, and MD was notified. Pt was bleeding from a previous skin tear on her right arm. Heparin was paused. Bleeding was controlled. Head was stabilized with cervical collar. VS were stable. Pt was then sent to CT per MD orders.

## 2019-02-26 NOTE — Progress Notes (Signed)
Called for RR at 0214 for pt on floor, fallen out of bed. She was bleeding from skin tear R arm. Pt at baseline mental status, VSS. Pt to CT, neg for bleed. Will remain in her room with close observation.

## 2019-02-26 NOTE — Progress Notes (Signed)
ID Getting dialysis Still encephalopathic with occasional verbal response MRSA bacteremia Currently on daptomycin  Will restart vanco ( MIC 1  Repeatedly) and continue dapto until vanco level becomes therapeutic and then can stop dapto. May add rifampin ( if no DDI) because of AV stents ID will follow remotely thr next few days

## 2019-02-26 NOTE — Consult Note (Addendum)
Pharmacy Antibiotic Note  Jamie Arias is a 63 y.o. female admitted on 02/09/2019 with methicillin resistant Staphylococcus aureus  .  Pharmacy has been consulted for daptomycin dosing (previous antibiotics included vancomycin and ceftaroline) leukocytosis has resolved with no recent fevers. (per ID: Post covid morbidity- Long COVID)  12/21 CK 29 12/24 Discussed with Dr Delaine [name], Blood cultures have cleared and more stable from infectious standpoint, plan to change daptomycin to vancomycin.    Plan:  Continue daptomycin 650 mg IV every 48 hours (Hemodialysis pt)  Vancomycin 1500mg  IV x 1 today, then 750mg  IV qHD MWF  Plan to stop daptomyin once pre-HD level therapeutic (goal 15-25 mcg/mL). Plan level 12/28  CK weekly while on daptomycin (next 12/28)  Height: 5\' 4"  (162.6 cm) Weight: 161 lb 6 oz (73.2 kg) IBW/kg (Calculated) : 54.7  Temp (24hrs), Avg:97.9 F (36.6 C), Min:97.5 F (36.4 C), Max:98.5 F (36.9 C)  Recent Labs  Lab 02/21/19 1159 02/23/19 0539 02/24/19 0241 02/25/19 0640 02/25/19 0759 02/25/19 0845 02/26/19 0231  WBC 9.6  --  5.7  --  7.1 5.7 6.7  CREATININE 2.88* 4.21* 2.45* 3.35*  --   --  2.29*  2.25*    Estimated Creatinine Clearance: 24.4 mL/min (A) (by C-G formula based on SCr of 2.25 mg/dL (H)).    Antimicrobials this admission: ceftaroline 12/14 >> 12/21 daptomycin 12/14 >>  Vancomycin 12/24  Microbiology results: 12/16 BCx: NG 12/15 WCx MRSA 12/13 BCx: MRSA   Thank you for allowing pharmacy to be a part of this patient's care.  Doreene Eland, PharmD, BCPS.   Work Cell: [phone] 02/26/2019 9:32 AM

## 2019-02-26 NOTE — Consult Note (Signed)
ANTICOAGULATION CONSULT NOTE  Pharmacy Consult for heparin Indication: DVT  Patient Measurements: Height: 5\' 4"  (162.6 cm) Weight: 161 lb 6 oz (73.2 kg) IBW/kg (Calculated) : 54.7 Heparin Dosing Weight: 71 kg  Vital Signs: Temp: 97.5 F (36.4 C) (12/24 0412) Temp Source: Oral (12/24 0412) BP: 136/53 (12/24 0412) Pulse Rate: 65 (12/24 0412)  Labs: Recent Labs    02/23/19 0539 02/23/19 1000 02/24/19 0241 02/25/19 0640 02/25/19 0759 02/25/19 0845 02/25/19 1624 02/26/19 0231  HGB  --    < > 6.8*  --  9.3* 9.0*  --  9.4*  HCT  --    < > 20.9*  --  28.0* 28.0*  --  29.1*  PLT  --    < > 153  --  166 171  --  179  HEPARINUNFRC  --   --  0.39 0.57  --   --  0.48 0.26*  CREATININE 4.21*  --  2.45* 3.35*  --   --   --  2.29*  2.25*  CKTOTAL 29*  --   --   --   --   --   --   --    < > = values in this interval not displayed.   Estimated Creatinine Clearance: 24.4 mL/min (A) (by C-G formula based on SCr of 2.25 mg/dL (H)).  Medical History: Past Medical History:  Diagnosis Date  . Anemia   . Chronic diastolic CHF (congestive heart failure) ([place])    a. Due to ischemic cardiomyopathy. EF as low as 35%, improved to normal s/p CABG; b. echo 07/06/13: EF 55-60%, no RWMAs, mod TR, trivial pericardial effusion not c/w tamponade physiology;  c. 10/2015 Echo: EF 65%, Gr1 DD, triv AI, mild MR, mildly dil LA, mod TR, PASP 78mmHg.  Marland Kitchen Coronary artery disease    a. NSTEMI 06/2013; b.cath: severe three-vessel CAD w/ EF 30% & mild-mod MR; c. s/p 3 vessel CABG 07/02/13 (LIMA-LAD, SVG-OM, and SVG-RPDA);  d. 10/2015 MV: no ischemia/infarct.  . Diabetes mellitus without complication ([place])   . Diabetic neuropathy ([place])   . Dialysis patient [facility])    MWF  . ESRD (end stage renal disease) ([place])    a. 12/2015 initiated - mwf dialysis.  Marland Kitchen GERD (gastroesophageal reflux disease)   . Hyperlipidemia   . Hypertension   . Hypothyroidism   . Myocardial infarction ([place]) 2015  . Neuropathy   . Pleural  effusion 2015  . Pulmonary hypertension ([place])   . Renal insufficiency   . Wears dentures    full lower    Medications:  Scheduled:  . aspirin  81 mg Oral Daily  . B-complex with vitamin C  1 tablet Per Tube Daily  . chlorhexidine  15 mL Mouth Rinse BID  . Chlorhexidine Gluconate Cloth  6 each Topical Q0600  . epoetin (EPOGEN/PROCRIT) injection  4,000 Units Intravenous Q M,W,F-HD  . feeding supplement (PRO-STAT SUGAR FREE 64)  30 mL Per Tube Daily  . fluticasone  2 spray Each Nare Daily  . free water  30 mL Per Tube Q4H  . insulin aspart  0-9 Units Subcutaneous TID WC  . levothyroxine  88 mcg Oral QAC breakfast  . mouth rinse  15 mL Mouth Rinse q12n4p  . midodrine  10 mg Oral TID WC  . pantoprazole (PROTONIX) IV  40 mg Intravenous Q24H  . venlafaxine  75 mg Oral BID WC  . zinc sulfate  220 mg Oral Daily    Assessment: 63 y.o. female admitted on  02/09/2019 with MRSA  bacteremia. She was on apixaban PTA but last dose was prior to admission (12/7). [place] 12/15 revealed age-indeterminate short segment occlusive DVT involving the mid humeral aspect of one of the paired brachial veins.  Heparin was started but after repeated attempts by multiple phlebotomists lab was unable to draw labs required for heparin monitoring. Additionally, she was too confused and disoriented to swallow oral medications. On 12/20 a femoral central line was placed. She is having her AV fistula replaced today. She received a single dose of enoxaparin 12/18 and a heparin level this morning showed an untraceable level. Hgb is down to 6.8 this morning. Dr Priscella Mann is aware and has ordered a unit of PRBCs  Heparin Course: 12/21 initiation: 900 units/hr w/o bolus 12/22 0241 HL 0.39 therapeutic x 1 12/22 1250 HL 0.63: dec rate to 800 units/hr 12/23 0640 HL 0.57: no change 12/23 1624 HL 0.48: no change 12/24 0231 HL 0.26: heparin has been on hold due to unwitnessed fall and bleeding from arm  Goal of Therapy:  Heparin  Level: 0.3 - 0.7 units/mL Monitor platelets by anticoagulation protocol: Yes   Plan: heparin currently on hold: Providers note reviewed as below: 1. Traumatic Fall -  Unwitnessed fall causing significant bleeding from right arm - STAT CT head and Cervical Spine obtained which showed no acute intracranial abnormality. - Cervical collar removed has no injury noted on CT and patient denies neck pain - Will hold off Heparin gtt for now given large hematoma and bleeding for the arm, will repeat labs in the am and continue to monitor for any neuro changes or signs of internal bleeding. Will consider re-starting Heparin gtt if patient stable.  CBC in am   Ena Dawley, PharmD Clinical Pharmacist 02/26/2019 5:24 AM

## 2019-02-26 NOTE — Progress Notes (Signed)
PROGRESS NOTE    Jamie Arias  [id number] DOB: January 19, 1956 DOA: 02/09/2019 PCP: [name], MD   Brief Narrative:  63 year old with history of ESRD on HD, hypothyroidism, DM 2, admitted to the hospital for severe septic shock,  encephalopathic found to have MRSA bacteremia.  Started on IV vancomycin, nephrology and infectious disease were consulted.  Echocardiogram was negative therefore TEE planned per cardiology.  Due to difficult access femoral line was placed, Teflaro stopped.  Issues with hemodialysis access therefore tunneled catheter placed until the graft site is healed.  Sustained a fall on the night of 02/25/2019 causing bleeding from her right arm with spontaneously stopped with basic management.   Assessment & Plan:   Principal Problem:   Sepsis due to methicillin resistant Staphylococcus aureus (MRSA) ([place]) Active Problems:   Diabetes mellitus, type 2 (HCC)   Hypotension   Chronic diastolic CHF (congestive heart failure) (HCC)   ESRD (end stage renal disease) (HCC)   Altered level of consciousness   Pressure injury of skin   MRSA bacteremia   Arm DVT (deep venous thromboembolism), acute, left (HCC)   Acute encephalopathy   Septic shock secondary to MRSA bacteremia -Shock physiology resolved, sepsis improving.  Echo-negative.  CT spine-negative -TEE pending when mentation improves -Antibiotic-daptomycin  Acute blood loss from the right arm Mechanical fall -Fall causing abrasion in the right arm causing bleeding.  Currently subsided with dressing in place. -CT head negative  ESRD on hemodialysis -Issues with AV graft therefore tunneled catheter placed by vascular  Acute metabolic encephalopathy, slowly improving -Likely sepsis related.  MRI brain-negative.  EEG-negative -Ammonia-normal.  Seen by neurology, does not think LP would give any diagnostic value at this point. -TSH-noted -Blood glucose-normal. -Question that her mentation could be  improving after discontinuing ceftaroline  Acute left arm DVT -Restarted heparin drip  History of chronic atrial fibrillation -On amiodarone and Eliquis which are currently on hold until p.o. status improves.  Chronic diastolic congestive heart failure -Continue to monitor volume status  Diabetes mellitus type 2 -Insulin sliding scale and Accu-Chek  Hypothyroidism -Synthroid 88 mcg daily  GERD -PPI  Anemia of chronic disease -Intermittently received transfusion secondary to blood loss anemia, iatrogenic  Goals of care discussion -Palliative care following.  Currently patient is DNR. -NG tube in place.  DVT prophylaxis: Heparin drip Code Status: DNR Family Communication: None none at bedside Disposition Plan: Maintain hospital stay pending mentation improvement  Consultants:   ID  Nephrology  Cardiology  Neurology  Procedures:   Right femoral CVC 12/8-removed  Right femoral arterial line 12/8-removed  Left chest permacath 12/21  Antimicrobials:   Vancomycin 12/7-12/14  Ceftaroline 12/14-12/21  Daptomycin 12/14   Subjective: Apparently patient had a fall overnight causing large amount of blood loss from her right upper extremity which was stabilized.  This morning patient is quite tired and sleepy because she did not get much rest last night.  No other complaints.  Review of Systems Otherwise negative except as per HPI, including: General = no fevers, chills, dizziness, malaise, fatigue HEENT/EYES = negative for pain, redness, loss of vision, double vision, blurred vision, loss of hearing, sore throat, hoarseness, dysphagia Cardiovascular= negative for chest pain, palpitation, murmurs, lower extremity swelling Respiratory/lungs= negative for shortness of breath, cough, hemoptysis, wheezing, mucus production Gastrointestinal= negative for nausea, vomiting,, abdominal pain, melena, hematemesis Genitourinary= negative for Dysuria, Hematuria, Change in  Urinary Frequency MSK = Negative for arthralgia, myalgias, Back Pain, Joint swelling  Neurology= Negative for headache, seizures, numbness,  tingling  Psychiatry= Negative for anxiety, depression, suicidal and homocidal ideation Allergy/Immunology= Medication/Food allergy as listed  Skin= Negative for Rash, lesions, ulcers, itching   Objective: Vitals:   02/26/19 0756 02/26/19 1238 02/26/19 1340 02/26/19 1345  BP: (!) 123/52 (!) 131/58  140/61  Pulse: 66 73 74 75  Resp: (!) 9 13 15 15   Temp: 98 F (36.7 C) 98 F (36.7 C)    TempSrc: Oral Oral    SpO2: 100% 98% 97%   Weight:      Height:        Intake/Output Summary (Last 24 hours) at 02/26/2019 1408 Last data filed at 02/25/2019 1855 Gross per 24 hour  Intake 10 ml  Output --  Net 10 ml   Filed Weights   02/23/19 1333 02/25/19 0842 02/25/19 1200  Weight: 73.4 kg 74.1 kg 73.2 kg    Examination: Constitutional: Chronically ill and frail appearing, NG tube in place Respiratory: Diminished breath sounds bilaterally Cardiovascular: Normal sinus rhythm, no rubs Abdomen: Nontender nondistended good bowel sounds Musculoskeletal: No edema noted Skin: No rashes seen Neurologic: Alert to name and place, no focal neuro deficits Psychiatric: Poor judgment and insight   External catheter   Data Reviewed:   CBC: Recent Labs  Lab 02/21/19 1159 02/24/19 0241 02/25/19 0759 02/25/19 0845 02/26/19 0231  WBC 9.6 5.7 7.1 5.7 6.7  HGB 7.7* 6.8* 9.3* 9.0* 9.4*  HCT 24.3* 20.9* 28.0* 28.0* 29.1*  MCV 88.4 87.1 87.5 88.9 89.8  PLT 227 153 166 171 0000000   Basic Metabolic Panel: Recent Labs  Lab 02/19/19 2119 02/21/19 1159 02/22/19 0531 02/23/19 0539 02/24/19 0241 02/25/19 0640 02/26/19 0231  NA 142 141  --  143 140 140 141  140  K 3.0* 3.2* 3.1* 2.9* 3.3* 3.5 3.3*  3.4*  CL 100 101  --  106 104 105 100  100  CO2 28 21*  --  21* 25 21* 26  22  GLUCOSE 119* 184*  --  197* 188* 194* 198*  198*  BUN 11 10  --  17 9  16 9  10   CREATININE 3.29* 2.88*  --  4.21* 2.45* 3.35* 2.29*  2.25*  CALCIUM 8.4* 8.5*  --  8.0* 8.0* 8.1* 7.9*  7.8*  MG 1.9 1.7  --  1.8 2.0 2.1 1.7  PHOS 2.9  --   --  3.7 2.8 3.5 1.9*   GFR: Estimated Creatinine Clearance: 24.4 mL/min (A) (by C-G formula based on SCr of 2.25 mg/dL (H)). Liver Function Tests: Recent Labs  Lab 02/19/19 2119 02/26/19 0231  AST  --  26  ALT  --  20  ALKPHOS  --  59  BILITOT  --  1.9*  PROT  --  4.9*  ALBUMIN 2.2* 2.1*  2.1*   No results for input(s): LIPASE, AMYLASE in the last 168 hours. Recent Labs  Lab 02/19/19 2119  AMMONIA 12   Coagulation Profile: No results for input(s): INR, PROTIME in the last 168 hours. Cardiac Enzymes: Recent Labs  Lab 02/23/19 0539  CKTOTAL 29*   BNP (last 3 results) No results for input(s): PROBNP in the last 8760 hours. HbA1C: No results for input(s): HGBA1C in the last 72 hours. CBG: Recent Labs  Lab 02/25/19 1402 02/25/19 1652 02/25/19 2148 02/26/19 0757 02/26/19 1234  GLUCAP 86 117* 170* 184* 173*   Lipid Profile: No results for input(s): CHOL, HDL, LDLCALC, TRIG, CHOLHDL, LDLDIRECT in the last 72 hours. Thyroid Function Tests: No results for input(s):  TSH, T4TOTAL, FREET4, T3FREE, THYROIDAB in the last 72 hours. Anemia Panel: Recent Labs    02/25/19 0846  VITAMINB12 461   Sepsis Labs: No results for input(s): PROCALCITON, LATICACIDVEN in the last 168 hours.  Recent Results (from the past 240 hour(s))  Aerobic Culture (superficial specimen)     Status: None   Collection Time: 02/17/19  1:31 PM   Specimen: Heel; Wound  Result Value Ref Range Status   Specimen Description   Final    HEEL Performed at [facility], [address] [place], [place] [address]    Special Requests   Final    NONE Performed at [facility], [address] [place], [place] [address]    Gram Stain   Final    NO WBC SEEN MODERATE GRAM POSITIVE COCCI IN PAIRS    Culture    Final    ABUNDANT METHICILLIN RESISTANT STAPHYLOCOCCUS AUREUS ABUNDANT DIPHTHEROIDS(CORYNEBACTERIUM SPECIES) Standardized susceptibility testing for this organism is not available. Performed at [facility], [place] [address] [place], [place] [address]    Report Status 02/19/2019 FINAL  Final   Organism ID, Bacteria METHICILLIN RESISTANT STAPHYLOCOCCUS AUREUS  Final      Susceptibility   Methicillin resistant staphylococcus aureus - MIC*    CIPROFLOXACIN >=8 RESISTANT Resistant     ERYTHROMYCIN >=8 RESISTANT Resistant     GENTAMICIN <=0.5 SENSITIVE Sensitive     OXACILLIN >=4 RESISTANT Resistant     TETRACYCLINE <=1 SENSITIVE Sensitive     VANCOMYCIN 1 SENSITIVE Sensitive     TRIMETH/SULFA <=10 SENSITIVE Sensitive     CLINDAMYCIN RESISTANT Resistant     RIFAMPIN <=0.5 SENSITIVE Sensitive     Inducible Clindamycin POSITIVE Resistant     * ABUNDANT METHICILLIN RESISTANT STAPHYLOCOCCUS AUREUS  CULTURE, BLOOD (ROUTINE X 2) w Reflex to ID Panel     Status: None   Collection Time: 02/18/19 11:29 PM   Specimen: BLOOD  Result Value Ref Range Status   Specimen Description BLOOD LEFT ARM  Final   Special Requests   Final    BOTTLES DRAWN AEROBIC AND ANAEROBIC Blood Culture adequate volume   Culture   Final    NO GROWTH 5 DAYS Performed at [facility], [address] [place], [place] [address]    Report Status 02/23/2019 FINAL  Final  CULTURE, BLOOD (ROUTINE X 2) w Reflex to ID Panel     Status: None   Collection Time: 02/18/19 11:29 PM   Specimen: BLOOD  Result Value Ref Range Status   Specimen Description BLOOD LEFT ARM  Final   Special Requests   Final    BOTTLES DRAWN AEROBIC AND ANAEROBIC Blood Culture adequate volume   Culture   Final    NO GROWTH 5 DAYS Performed at [facility], [address] [place], [place] [address]    Report Status 02/23/2019 FINAL  Final         Radiology Studies: CT HEAD WO CONTRAST  Result Date:  02/26/2019 CLINICAL DATA:  Head trauma EXAM: CT HEAD WITHOUT CONTRAST CT CERVICAL SPINE WITHOUT CONTRAST TECHNIQUE: Multidetector CT imaging of the head and cervical spine was performed following the standard protocol without intravenous contrast. Multiplanar CT image reconstructions of the cervical spine were also generated. COMPARISON:  None. FINDINGS: CT HEAD FINDINGS Brain: There is no mass, hemorrhage or extra-axial collection. There is generalized atrophy without lobar predilection. There is hypoattenuation of the periventricular white matter, most commonly indicating chronic ischemic microangiopathy. Vascular: Atherosclerotic  calcification of the internal carotid arteries at the skull base. No abnormal hyperdensity of the major intracranial arteries or dural venous sinuses. Skull: The visualized skull base, calvarium and extracranial soft tissues are normal. Sinuses/Orbits: No fluid levels or advanced mucosal thickening of the visualized paranasal sinuses. No mastoid or middle ear effusion. The orbits are normal. CT CERVICAL SPINE FINDINGS Alignment: No static subluxation. Facets are aligned. Occipital condyles are normally positioned. Skull base and vertebrae: No acute fracture. Soft tissues and spinal canal: No prevertebral fluid or swelling. No visible canal hematoma. Disc levels: No advanced spinal canal or neural foraminal stenosis. Upper chest: No pneumothorax, pulmonary nodule or pleural effusion. Other: Normal visualized paraspinal cervical soft tissues. IMPRESSION: 1. Generalized atrophy and chronic ischemic microangiopathy without acute intracranial abnormality. 2. No acute fracture or static subluxation of the cervical spine. Electronically Signed   By: Ulyses Jarred M.D.   On: 02/26/2019 03:04   CT CERVICAL SPINE WO CONTRAST  Result Date: 02/26/2019 CLINICAL DATA:  Head trauma EXAM: CT HEAD WITHOUT CONTRAST CT CERVICAL SPINE WITHOUT CONTRAST TECHNIQUE: Multidetector CT imaging of the head  and cervical spine was performed following the standard protocol without intravenous contrast. Multiplanar CT image reconstructions of the cervical spine were also generated. COMPARISON:  None. FINDINGS: CT HEAD FINDINGS Brain: There is no mass, hemorrhage or extra-axial collection. There is generalized atrophy without lobar predilection. There is hypoattenuation of the periventricular white matter, most commonly indicating chronic ischemic microangiopathy. Vascular: Atherosclerotic calcification of the internal carotid arteries at the skull base. No abnormal hyperdensity of the major intracranial arteries or dural venous sinuses. Skull: The visualized skull base, calvarium and extracranial soft tissues are normal. Sinuses/Orbits: No fluid levels or advanced mucosal thickening of the visualized paranasal sinuses. No mastoid or middle ear effusion. The orbits are normal. CT CERVICAL SPINE FINDINGS Alignment: No static subluxation. Facets are aligned. Occipital condyles are normally positioned. Skull base and vertebrae: No acute fracture. Soft tissues and spinal canal: No prevertebral fluid or swelling. No visible canal hematoma. Disc levels: No advanced spinal canal or neural foraminal stenosis. Upper chest: No pneumothorax, pulmonary nodule or pleural effusion. Other: Normal visualized paraspinal cervical soft tissues. IMPRESSION: 1. Generalized atrophy and chronic ischemic microangiopathy without acute intracranial abnormality. 2. No acute fracture or static subluxation of the cervical spine. Electronically Signed   By: Ulyses Jarred M.D.   On: 02/26/2019 03:04        Scheduled Meds: . aspirin  81 mg Oral Daily  . B-complex with vitamin C  1 tablet Per Tube Daily  . chlorhexidine  15 mL Mouth Rinse BID  . Chlorhexidine Gluconate Cloth  6 each Topical Q0600  . epoetin (EPOGEN/PROCRIT) injection  4,000 Units Intravenous Q M,W,F-HD  . feeding supplement (PRO-STAT SUGAR FREE 64)  30 mL Per Tube Daily  .  fluticasone  2 spray Each Nare Daily  . free water  30 mL Per Tube Q4H  . insulin aspart  0-9 Units Subcutaneous TID WC  . levothyroxine  88 mcg Oral QAC breakfast  . mouth rinse  15 mL Mouth Rinse q12n4p  . midodrine  10 mg Oral TID WC  . pantoprazole (PROTONIX) IV  40 mg Intravenous Q24H  . venlafaxine  75 mg Oral BID WC  . zinc sulfate  220 mg Oral Daily   Continuous Infusions: . sodium chloride 10 mL/hr at 02/25/19 1855  . DAPTOmycin (CUBICIN)  IV 650 mg (02/24/19 2009)  . feeding supplement (OSMOLITE 1.5 CAL) 1,000 mL (02/26/19  1206)  . heparin 800 Units/hr (02/26/19 0923)  . vancomycin    . [START ON 02/27/2019] vancomycin       LOS: 17 days   Time spent=25 mins    Tibor Lemmons Arsenio Loader, MD [facility]  If 7PM-7AM, please contact night-coverage  02/26/2019, 2:08 PM

## 2019-02-26 NOTE — Progress Notes (Signed)
Pt is A&Ox2. She was able to tell me her name, husbands name, and where she was. Clear speech, and motor function intact from baseline. VS stable.

## 2019-02-26 NOTE — Consult Note (Signed)
ANTICOAGULATION CONSULT NOTE  Pharmacy Consult for heparin Indication: DVT  Patient Measurements: Height: 5\' 4"  (162.6 cm) Weight: 161 lb 6 oz (73.2 kg) IBW/kg (Calculated) : 54.7 Heparin Dosing Weight: 71 kg  Vital Signs: Temp: 98 F (36.7 C) (12/24 1238) Temp Source: Oral (12/24 1238) BP: 90/43 (12/24 1615) Pulse Rate: 76 (12/24 1615)  Labs: Recent Labs    02/24/19 0241 02/24/19 1250 02/25/19 0640 02/25/19 0845 02/25/19 1624 02/26/19 0231 02/26/19 1413  HGB 6.8*   < >  --  9.0*  --  9.4* 8.9*  HCT 20.9*   < >  --  28.0*  27.6*  --  29.1* 27.2*  PLT 153   < >  --  171  --  179 155  HEPARINUNFRC 0.39  --  0.57  --  0.48 0.26*  --   CREATININE 2.45*  --  3.35*  --   --  2.29*  2.25*  --    < > = values in this interval not displayed.   Estimated Creatinine Clearance: 24.4 mL/min (A) (by C-G formula based on SCr of 2.25 mg/dL (H)).  Medical History: Past Medical History:  Diagnosis Date  . Anemia   . Chronic diastolic CHF (congestive heart failure) ([place])    a. Due to ischemic cardiomyopathy. EF as low as 35%, improved to normal s/p CABG; b. echo 07/06/13: EF 55-60%, no RWMAs, mod TR, trivial pericardial effusion not c/w tamponade physiology;  c. 10/2015 Echo: EF 65%, Gr1 DD, triv AI, mild MR, mildly dil LA, mod TR, PASP 21mmHg.  Marland Kitchen Coronary artery disease    a. NSTEMI 06/2013; b.cath: severe three-vessel CAD w/ EF 30% & mild-mod MR; c. s/p 3 vessel CABG 07/02/13 (LIMA-LAD, SVG-OM, and SVG-RPDA);  d. 10/2015 MV: no ischemia/infarct.  . Diabetes mellitus without complication ([place])   . Diabetic neuropathy ([place])   . Dialysis patient [facility])    MWF  . ESRD (end stage renal disease) ([place])    a. 12/2015 initiated - mwf dialysis.  Marland Kitchen GERD (gastroesophageal reflux disease)   . Hyperlipidemia   . Hypertension   . Hypothyroidism   . Myocardial infarction ([place]) 2015  . Neuropathy   . Pleural effusion 2015  . Pulmonary hypertension ([place])   . Renal insufficiency   . Wears dentures     full lower    Medications:  Scheduled:  . aspirin  81 mg Oral Daily  . B-complex with vitamin C  1 tablet Per Tube Daily  . chlorhexidine  15 mL Mouth Rinse BID  . Chlorhexidine Gluconate Cloth  6 each Topical Q0600  . epoetin (EPOGEN/PROCRIT) injection  4,000 Units Intravenous Q M,W,F-HD  . feeding supplement (PRO-STAT SUGAR FREE 64)  30 mL Per Tube Daily  . fluticasone  2 spray Each Nare Daily  . free water  30 mL Per Tube Q4H  . insulin aspart  0-9 Units Subcutaneous TID WC  . levothyroxine  88 mcg Oral QAC breakfast  . mouth rinse  15 mL Mouth Rinse q12n4p  . midodrine  10 mg Oral TID WC  . pantoprazole (PROTONIX) IV  40 mg Intravenous Q24H  . venlafaxine  75 mg Oral BID WC  . zinc sulfate  220 mg Oral Daily    Assessment: 63 y.o. female admitted on 02/09/2019 with MRSA  bacteremia. She was on apixaban PTA but last dose was prior to admission (12/7). [place] 12/15 revealed age-indeterminate short segment occlusive DVT involving the mid humeral aspect of one of the paired brachial  veins.  Heparin was started but after repeated attempts by multiple phlebotomists lab was unable to draw labs required for heparin monitoring. Additionally, she was too confused and disoriented to swallow oral medications. On 12/20 a femoral central line was placed. Heparin was stopped this morning due to a fall and is being restarted upon returning from HD  Heparin Course: 12/21 initiation: 900 units/hr w/o bolus 12/22 0241 HL 0.39 therapeutic x 1 12/22 1250 HL 0.63: dec rate to 800 units/hr 12/23 0640 HL 0.57: no change 12/23 1624 HL 0.48: no change 12/24 0231 HL 0.26: held due to unwitnessed fall and bleeding from arm  Goal of Therapy:  Heparin Level: 0.3 - 0.7 units/mL Monitor platelets by anticoagulation protocol: Yes   Plan:   Restart heparin at 800 units/hr  check HL in 8 hours after restarting  CBC in am  Dallie Piles, PharmD Clinical Pharmacist 02/26/2019 4:50 PM

## 2019-02-26 NOTE — Progress Notes (Signed)
Update: Received verbal order to hold heparin per Rufina Falco, NP.

## 2019-02-26 NOTE — Progress Notes (Signed)
Pharmacy Electrolyte Monitoring Consult:  Pharmacy consulted to assist in monitoring and replacing electrolytes in this 63 y.o. female admitted on 02/09/2019. Patient with ESRD; last dialysis 12/14. Patient with MRSA bacteremia. She has a h/o CAD and is s/p CABG  Labs:  Sodium (mmol/L)  Date Value  02/26/2019 140  02/26/2019 141  03/30/2014 140   Potassium (mmol/L)  Date Value  02/26/2019 3.4 (L)  02/26/2019 3.3 (L)  03/30/2014 4.3   Magnesium (mg/dL)  Date Value  02/26/2019 1.7  07/19/2013 1.7 (L)   Phosphorus (mg/dL)  Date Value  02/26/2019 1.9 (L)   Calcium (mg/dL)  Date Value  02/26/2019 7.8 (L)  02/26/2019 7.9 (L)   Calcium, Total (mg/dL)  Date Value  03/30/2014 9.0   Albumin (g/dL)  Date Value  02/26/2019 2.1 (L)  02/26/2019 2.1 (L)  02/11/2018 4.2  07/23/2013 2.7 (L)   Corrected Ca: 9.8 mg/dL  Goals of Therapy Given Cardiac History: Potassium 4.0 - 5.1 mmol/L Magnesium 2.0 - 2.4 mg/dL All Other Electrolytes WNL   Plan: K 3.3  Mag 1.7  Phos 1.9  Scr 2.29 (hemodialysis)  Replace with Potassium Chloride 20 meq IV x1  (conservative as pt on Hemodialysis)  F/u Phosphorus level in am and discuss with nephrology before replacing if still low  Will order Magnesium 1 gram IV x 1  CMP, magnesium level in am  Pharmacy will continue to monitor and adjust per consult.   Chinita Greenland PharmD Clinical Pharmacist 02/26/2019

## 2019-02-27 LAB — CBC
HCT: 27.1 % — ABNORMAL LOW (ref 36.0–46.0)
Hemoglobin: 9.1 g/dL — ABNORMAL LOW (ref 12.0–15.0)
MCH: 29.3 pg (ref 26.0–34.0)
MCHC: 33.6 g/dL (ref 30.0–36.0)
MCV: 87.1 fL (ref 80.0–100.0)
Platelets: 170 10*3/uL (ref 150–400)
RBC: 3.11 MIL/uL — ABNORMAL LOW (ref 3.87–5.11)
RDW: 20.2 % — ABNORMAL HIGH (ref 11.5–15.5)
WBC: 6.8 10*3/uL (ref 4.0–10.5)
nRBC: 0.3 % — ABNORMAL HIGH (ref 0.0–0.2)

## 2019-02-27 LAB — HEPARIN LEVEL (UNFRACTIONATED)
Heparin Unfractionated: 0.28 IU/mL — ABNORMAL LOW (ref 0.30–0.70)
Heparin Unfractionated: 0.47 IU/mL (ref 0.30–0.70)
Heparin Unfractionated: 0.36 IU/mL (ref 0.30–0.70)

## 2019-02-27 LAB — COMPREHENSIVE METABOLIC PANEL
ALT: 18 U/L (ref 0–44)
AST: 22 U/L (ref 15–41)
Albumin: 2.1 g/dL — ABNORMAL LOW (ref 3.5–5.0)
Alkaline Phosphatase: 59 U/L (ref 38–126)
Anion gap: 12 (ref 5–15)
BUN: 6 mg/dL — ABNORMAL LOW (ref 8–23)
CO2: 31 mmol/L (ref 22–32)
Calcium: 8 mg/dL — ABNORMAL LOW (ref 8.9–10.3)
Chloride: 98 mmol/L (ref 98–111)
Creatinine, Ser: 1.54 mg/dL — ABNORMAL HIGH (ref 0.44–1.00)
GFR calc Af Amer: 41 mL/min — ABNORMAL LOW (ref >60)
GFR calc non Af Amer: 35 mL/min — ABNORMAL LOW (ref >60)
Glucose, Bld: 219 mg/dL — ABNORMAL HIGH (ref 70–99)
Potassium: 3 mmol/L — ABNORMAL LOW (ref 3.5–5.1)
Sodium: 141 mmol/L (ref 135–145)
Total Bilirubin: 1.7 mg/dL — ABNORMAL HIGH (ref 0.3–1.2)
Total Protein: 5 g/dL — ABNORMAL LOW (ref 6.5–8.1)

## 2019-02-27 LAB — MAGNESIUM: Magnesium: 1.9 mg/dL (ref 1.7–2.4)

## 2019-02-27 LAB — GLUCOSE, CAPILLARY
Glucose-Capillary: 235 mg/dL — ABNORMAL HIGH (ref 70–99)
Glucose-Capillary: 216 mg/dL — ABNORMAL HIGH (ref 70–99)
Glucose-Capillary: 194 mg/dL — ABNORMAL HIGH (ref 70–99)
Glucose-Capillary: 263 mg/dL — ABNORMAL HIGH (ref 70–99)

## 2019-02-27 LAB — PHOSPHORUS: Phosphorus: 1 mg/dL — CL (ref 2.5–4.6)

## 2019-02-27 MED ORDER — SODIUM CHLORIDE 0.9 % IV SOLN: INTRAVENOUS | Status: DC | PRN

## 2019-02-27 MED ORDER — POTASSIUM CHLORIDE 10 MEQ/100ML IV SOLN
10.0000 meq | INTRAVENOUS | Status: AC
  Administered 2019-02-27 (×4): 10 meq via INTRAVENOUS
  Filled 2019-02-27 (×4): qty 100

## 2019-02-27 MED ORDER — POTASSIUM PHOSPHATES 15 MMOLE/5ML IV SOLN
30.0000 mmol | Freq: Once | INTRAVENOUS | Status: AC
  Administered 2019-02-27: 30 mmol via INTRAVENOUS
  Filled 2019-02-27: qty 10

## 2019-02-27 MED ORDER — POTASSIUM PHOSPHATE MONOBASIC 500 MG PO TABS
500.0000 mg | ORAL_TABLET | Freq: Three times a day (TID) | ORAL | Status: DC
  Filled 2019-02-27: qty 1

## 2019-02-27 MED ORDER — SODIUM PHOSPHATES 45 MMOLE/15ML IV SOLN: 30.0000 mmol | Freq: Once | INTRAVENOUS | Status: DC

## 2019-02-27 NOTE — Progress Notes (Signed)
Pharmacy Electrolyte Monitoring Consult:  Pharmacy consulted to assist in monitoring and replacing electrolytes in this 63 y.o. female admitted on 02/09/2019. Patient with ESRD; last dialysis 12/14. Patient with MRSA bacteremia. She has a h/o CAD and is s/p CABG. Hemodialysis MWF.   Labs:  Sodium (mmol/L)  Date Value  02/27/2019 141  03/30/2014 140   Potassium (mmol/L)  Date Value  02/27/2019 3.0 (L)  03/30/2014 4.3   Magnesium (mg/dL)  Date Value  02/27/2019 1.9  07/19/2013 1.7 (L)   Phosphorus (mg/dL)  Date Value  02/27/2019 1.0 (LL)   Calcium (mg/dL)  Date Value  02/27/2019 8.0 (L)   Calcium, Total (mg/dL)  Date Value  03/30/2014 9.0   Albumin (g/dL)  Date Value  02/27/2019 2.1 (L)  02/11/2018 4.2  07/23/2013 2.7 (L)   Corrected Ca: 10.5 mg/dL  Goals of Therapy Given Cardiac History: Potassium 4.0 - 5.1 mmol/L Magnesium 2.0 - 2.4 mg/dL All Other Electrolytes WNL   Plan:  K+ 3 MD ordered KCl 10 mEq x 4.   Phos low - MD ordered Kphos IV 30 mmol.   Mg 1.9 - f/u with Mg lab in the AM.    CMP, magnesium level in am  Pharmacy will continue to monitor and adjust per consult.   Eleonore Chiquito, PharmD, BCPS Clinical Pharmacist 02/27/2019

## 2019-02-27 NOTE — Consult Note (Signed)
ANTICOAGULATION CONSULT NOTE  Pharmacy Consult for heparin Indication: DVT  Patient Measurements: Height: 5\' 4"  (162.6 cm) Weight: 161 lb 6 oz (73.2 kg) IBW/kg (Calculated) : 54.7 Heparin Dosing Weight: 71 kg  Vital Signs: Temp: 98.1 F (36.7 C) (12/24 2144) Temp Source: Oral (12/24 2144) BP: 121/42 (12/24 2144) Pulse Rate: 74 (12/24 2144)  Labs: Recent Labs    02/25/19 0640 02/25/19 0759 02/25/19 1624 02/26/19 0231 02/26/19 1413 02/27/19 0413  HGB  --   --   --  9.4* 8.9* 9.1*  HCT  --    < >  --  29.1* 27.2* 27.1*  PLT  --   --   --  179 155 170  HEPARINUNFRC 0.57  --  0.48 0.26*  --  0.28*  CREATININE 3.35*  --   --  2.29*  2.25*  --   --    < > = values in this interval not displayed.   Estimated Creatinine Clearance: 24.4 mL/min (A) (by C-G formula based on SCr of 2.25 mg/dL (H)).  Medical History: Past Medical History:  Diagnosis Date  . Anemia   . Chronic diastolic CHF (congestive heart failure) ([place])    a. Due to ischemic cardiomyopathy. EF as low as 35%, improved to normal s/p CABG; b. echo 07/06/13: EF 55-60%, no RWMAs, mod TR, trivial pericardial effusion not c/w tamponade physiology;  c. 10/2015 Echo: EF 65%, Gr1 DD, triv AI, mild MR, mildly dil LA, mod TR, PASP 40mmHg.  Marland Kitchen Coronary artery disease    a. NSTEMI 06/2013; b.cath: severe three-vessel CAD w/ EF 30% & mild-mod MR; c. s/p 3 vessel CABG 07/02/13 (LIMA-LAD, SVG-OM, and SVG-RPDA);  d. 10/2015 MV: no ischemia/infarct.  . Diabetes mellitus without complication ([place])   . Diabetic neuropathy ([place])   . Dialysis patient [facility])    MWF  . ESRD (end stage renal disease) ([place])    a. 12/2015 initiated - mwf dialysis.  Marland Kitchen GERD (gastroesophageal reflux disease)   . Hyperlipidemia   . Hypertension   . Hypothyroidism   . Myocardial infarction ([place]) 2015  . Neuropathy   . Pleural effusion 2015  . Pulmonary hypertension ([place])   . Renal insufficiency   . Wears dentures    full lower    Medications:  Scheduled:   . aspirin  81 mg Oral Daily  . B-complex with vitamin C  1 tablet Per Tube Daily  . chlorhexidine  15 mL Mouth Rinse BID  . Chlorhexidine Gluconate Cloth  6 each Topical Q0600  . epoetin (EPOGEN/PROCRIT) injection  4,000 Units Intravenous Q M,W,F-HD  . feeding supplement (PRO-STAT SUGAR FREE 64)  30 mL Per Tube Daily  . fluticasone  2 spray Each Nare Daily  . free water  30 mL Per Tube Q4H  . insulin aspart  0-9 Units Subcutaneous TID WC  . levothyroxine  88 mcg Oral QAC breakfast  . mouth rinse  15 mL Mouth Rinse q12n4p  . midodrine  10 mg Oral TID WC  . pantoprazole (PROTONIX) IV  40 mg Intravenous Q24H  . venlafaxine  75 mg Oral BID WC  . zinc sulfate  220 mg Oral Daily    Assessment: 63 y.o. female admitted on 02/09/2019 with MRSA  bacteremia. She was on apixaban PTA but last dose was prior to admission (12/7). [place] 12/15 revealed age-indeterminate short segment occlusive DVT involving the mid humeral aspect of one of the paired brachial veins.  Heparin was started but after repeated attempts by multiple phlebotomists lab  was unable to draw labs required for heparin monitoring. Additionally, she was too confused and disoriented to swallow oral medications. On 12/20 a femoral central line was placed. Heparin was stopped this morning due to a fall and is being restarted upon returning from HD  Heparin Course: 12/21 initiation: 900 units/hr w/o bolus 12/22 0241 HL 0.39 therapeutic x 1 12/22 1250 HL 0.63: dec rate to 800 units/hr 12/23 0640 HL 0.57: no change 12/23 1624 HL 0.48: no change 12/24 0231 HL 0.26: held due to unwitnessed fall and bleeding from arm 12/25 0413 HL 0.28: subtherapeutic - CBC stable.  Will increase Heparin to 900 units/hr and recheck in 8 hours.  Goal of Therapy:  Heparin Level: 0.3 - 0.7 units/mL Monitor platelets by anticoagulation protocol: Yes   Plan:   Increase heparin to 900 units/hr  Recheck HL in 8 hours after increase  CBC in am  Ena Dawley,  PharmD Clinical Pharmacist 02/27/2019 5:37 AM

## 2019-02-27 NOTE — Progress Notes (Signed)
PROGRESS NOTE    Jamie Arias  [id number] DOB: 1955/06/13 DOA: 02/09/2019 PCP: [name], MD   Brief Narrative:  63 year old with history of ESRD on HD, hypothyroidism, DM 2, admitted to the hospital for severe septic shock,  encephalopathic found to have MRSA bacteremia.  Started on IV vancomycin, nephrology and infectious disease were consulted.  Echocardiogram was negative therefore TEE planned per cardiology.  Due to difficult access femoral line was placed, Teflaro stopped.  Issues with hemodialysis access therefore tunneled catheter placed until the graft site is healed.  Sustained a fall on the night of 02/25/2019 causing bleeding from her right arm with spontaneously stopped with basic management.   Assessment & Plan:   Principal Problem:   Sepsis due to methicillin resistant Staphylococcus aureus (MRSA) ([place]) Active Problems:   Diabetes mellitus, type 2 (H[facility])   Hypotension   Chronic diastolic CHF (congestive heart failure) ([facility])   ESRD (end stage renal disease) ([facility])   Altered level of consciousness   Pressure injury of skin   MRSA bacteremia   Arm DVT (deep venous thromboembolism), acute, left (H[facility])   Acute encephalopathy   Septic shock secondary to MRSA bacteremia -Shock physiology resolved, sepsis improving.  Echo-negative.  CT spine-negative -TEE-pending -Antibiotic-transition of daptomycin once vancomycin is therapeutic.  Acute blood loss from the right arm Mechanical fall -Fall causing abrasion in the right arm causing bleeding.  Currently subsided with dressing in place. -CT head negative  ESRD on hemodialysis -Issues with AV graft therefore tunneled catheter placed by vascular  Acute metabolic encephalopathy, intermittent -Likely sepsis related.  MRI brain-negative.  EEG-negative -Ammonia-normal.  Seen by neurology, does not think LP would give any diagnostic value at this point. -TSH-noted -Blood glucose-normal. -Question that her mentation  could be improving after discontinuing ceftaroline  Acute left arm DVT -On heparin drip  History of chronic atrial fibrillation -On amiodarone and Eliquis which are currently on hold until p.o. status improves. In the meantime on Heparin drip.   Chronic diastolic congestive heart failure -Continue to monitor volume status  Diabetes mellitus type 2 -Insulin sliding scale and Accu-Chek  Hypothyroidism -Synthroid 88 mcg daily  GERD -PPI  Anemia of chronic disease -Intermittently received transfusion secondary to blood loss anemia, iatrogenic  Goals of care discussion -Palliative care following.  Currently patient is DNR.  Overall patient has poor quality of life -NG tube in place.  NG tube in place for feeding.  Not alert enough for oral feeding.  DVT prophylaxis: Heparin drip Code Status: DNR Family Communication: None none at bedside Disposition Plan: Maintain hospital stay pending improvement in mentation  Consultants:   ID  Nephrology  Cardiology  Neurology  Procedures:   Right femoral CVC 12/8-removed  Right femoral arterial line 12/8-removed  Left chest permacath 12/21  Antimicrobials:   Vancomycin 12/7-12/14  Ceftaroline 12/14-12/21  Daptomycin 12/14   Subjective: No acute events overnight.  Patient is morning still remain somnolent but easily arousable.  Denies any complaints  Review of Systems Otherwise negative except as per HPI, including: General = no fevers, chills, dizziness, malaise, fatigue HEENT/EYES = negative for pain, redness, loss of vision, double vision, blurred vision, loss of hearing, sore throat, hoarseness, dysphagia Cardiovascular= negative for chest pain, palpitation, murmurs, lower extremity swelling Respiratory/lungs= negative for shortness of breath, cough, hemoptysis, wheezing, mucus production Gastrointestinal= negative for nausea, vomiting,, abdominal pain, melena, hematemesis Genitourinary= negative for Dysuria,  Hematuria, Change in Urinary Frequency MSK = Negative for arthralgia, myalgias, Back Pain, Joint swelling  Neurology= Negative for headache, seizures, numbness, tingling  Psychiatry= Negative for anxiety, depression, suicidal and homocidal ideation Allergy/Immunology= Medication/Food allergy as listed  Skin= Negative for Rash, lesions, ulcers, itching  Objective: Vitals:   02/26/19 1630 02/26/19 1645 02/26/19 2144 02/27/19 0552  BP: (!) 92/43 (!) 113/49 (!) 121/42 (!) 113/41  Pulse: 76 73 74 75  Resp: (!) 8 11 20 20   Temp:  98.9 F (37.2 C) 98.1 F (36.7 C) 98.5 F (36.9 C)  TempSrc:  Axillary Oral Oral  SpO2:   95% 99%  Weight:      Height:        Intake/Output Summary (Last 24 hours) at 02/27/2019 1006 Last data filed at 02/27/2019 0956 Gross per 24 hour  Intake 1772.86 ml  Output 500 ml  Net 1272.86 ml   Filed Weights   02/23/19 1333 02/25/19 0842 02/25/19 1200  Weight: 73.4 kg 74.1 kg 73.2 kg    Examination: Constitutional: Appears chronically ill and frail.  NG tube in place with tube feeds running. Respiratory: Diminished breath sounds anteriorly Cardiovascular: Normal sinus rhythm, no rubs Abdomen: Abdomen is nontender nondistended s Musculoskeletal: No edema noted Skin: No rashes seen Neurologic: CN 2-12 grossly intact.  Exam is nonfocal. Psychiatric: She is alert to name and place but has poor judgment and insight. External catheter   Data Reviewed:   CBC: Recent Labs  Lab 02/25/19 0759 02/25/19 0845 02/26/19 0231 02/26/19 1413 02/27/19 0413  WBC 7.1 5.7 6.7 5.8 6.8  HGB 9.3* 9.0* 9.4* 8.9* 9.1*  HCT 28.0* 28.0*  27.6* 29.1* 27.2* 27.1*  MCV 87.5 88.9 89.8 89.2 87.1  PLT 166 171 179 155 [id number]   Basic Metabolic Panel: Recent Labs  Lab 02/23/19 0539 02/24/19 0241 02/25/19 0640 02/26/19 0231 02/27/19 0413  NA 143 140 140 141  140 141  K 2.9* 3.3* 3.5 3.3*  3.4* 3.0*  CL 106 104 105 100  100 98  CO2 21* 25 21* 26  22 31   GLUCOSE 197*  188* 194* 198*  198* 219*  BUN 17 9 16 9  10  6*  CREATININE 4.21* 2.45* 3.35* 2.29*  2.25* 1.54*  CALCIUM 8.0* 8.0* 8.1* 7.9*  7.8* 8.0*  MG 1.8 2.0 2.1 1.7 1.9  PHOS 3.7 2.8 3.5 1.9* 1.0*   GFR: Estimated Creatinine Clearance: 35.7 mL/min (A) (by C-G formula based on SCr of 1.54 mg/dL (H)). Liver Function Tests: Recent Labs  Lab 02/26/19 0231 02/27/19 0413  AST 26 22  ALT 20 18  ALKPHOS 59 59  BILITOT 1.9* 1.7*  PROT 4.9* 5.0*  ALBUMIN 2.1*  2.1* 2.1*   No results for input(s): LIPASE, AMYLASE in the last 168 hours. No results for input(s): AMMONIA in the last 168 hours. Coagulation Profile: No results for input(s): INR, PROTIME in the last 168 hours. Cardiac Enzymes: Recent Labs  Lab 02/23/19 0539  CKTOTAL 29*   BNP (last 3 results) No results for input(s): PROBNP in the last 8760 hours. HbA1C: No results for input(s): HGBA1C in the last 72 hours. CBG: Recent Labs  Lab 02/26/19 0757 02/26/19 1234 02/26/19 1729 02/26/19 2140 02/27/19 0801  GLUCAP 184* 173* 114* 138* 235*   Lipid Profile: No results for input(s): CHOL, HDL, LDLCALC, TRIG, CHOLHDL, LDLDIRECT in the last 72 hours. Thyroid Function Tests: No results for input(s): TSH, T4TOTAL, FREET4, T3FREE, THYROIDAB in the last 72 hours. Anemia Panel: Recent Labs    02/25/19 0846  VITAMINB12 461   Sepsis Labs: No results for input(s): PROCALCITON,  LATICACIDVEN in the last 168 hours.  Recent Results (from the past 240 hour(s))  Aerobic Culture (superficial specimen)     Status: None   Collection Time: 02/17/19  1:31 PM   Specimen: Heel; Wound  Result Value Ref Range Status   Specimen Description   Final    HEEL Performed at [facility], [address], [place], [place] [address]    Special Requests   Final    NONE Performed at [facility], [address] [place], [place] [address]    Gram Stain   Final    NO WBC SEEN MODERATE GRAM POSITIVE COCCI IN PAIRS    Culture    Final    ABUNDANT METHICILLIN RESISTANT STAPHYLOCOCCUS AUREUS ABUNDANT DIPHTHEROIDS(CORYNEBACTERIUM SPECIES) Standardized susceptibility testing for this organism is not available. Performed at [facility], [place] [address], [place], [place] [address]    Report Status 02/19/2019 FINAL  Final   Organism ID, Bacteria METHICILLIN RESISTANT STAPHYLOCOCCUS AUREUS  Final      Susceptibility   Methicillin resistant staphylococcus aureus - MIC*    CIPROFLOXACIN >=8 RESISTANT Resistant     ERYTHROMYCIN >=8 RESISTANT Resistant     GENTAMICIN <=0.5 SENSITIVE Sensitive     OXACILLIN >=4 RESISTANT Resistant     TETRACYCLINE <=1 SENSITIVE Sensitive     VANCOMYCIN 1 SENSITIVE Sensitive     TRIMETH/SULFA <=10 SENSITIVE Sensitive     CLINDAMYCIN RESISTANT Resistant     RIFAMPIN <=0.5 SENSITIVE Sensitive     Inducible Clindamycin POSITIVE Resistant     * ABUNDANT METHICILLIN RESISTANT STAPHYLOCOCCUS AUREUS  CULTURE, BLOOD (ROUTINE X 2) w Reflex to ID Panel     Status: None   Collection Time: 02/18/19 11:29 PM   Specimen: BLOOD  Result Value Ref Range Status   Specimen Description BLOOD LEFT ARM  Final   Special Requests   Final    BOTTLES DRAWN AEROBIC AND ANAEROBIC Blood Culture adequate volume   Culture   Final    NO GROWTH 5 DAYS Performed at [facility], [address] [place], [place] [address]    Report Status 02/23/2019 FINAL  Final  CULTURE, BLOOD (ROUTINE X 2) w Reflex to ID Panel     Status: None   Collection Time: 02/18/19 11:29 PM   Specimen: BLOOD  Result Value Ref Range Status   Specimen Description BLOOD LEFT ARM  Final   Special Requests   Final    BOTTLES DRAWN AEROBIC AND ANAEROBIC Blood Culture adequate volume   Culture   Final    NO GROWTH 5 DAYS Performed at [facility], [address] [place], [place] [address]    Report Status 02/23/2019 FINAL  Final         Radiology Studies: CT HEAD WO CONTRAST  Result Date:  02/26/2019 CLINICAL DATA:  Head trauma EXAM: CT HEAD WITHOUT CONTRAST CT CERVICAL SPINE WITHOUT CONTRAST TECHNIQUE: Multidetector CT imaging of the head and cervical spine was performed following the standard protocol without intravenous contrast. Multiplanar CT image reconstructions of the cervical spine were also generated. COMPARISON:  None. FINDINGS: CT HEAD FINDINGS Brain: There is no mass, hemorrhage or extra-axial collection. There is generalized atrophy without lobar predilection. There is hypoattenuation of the periventricular white matter, most commonly indicating chronic ischemic microangiopathy. Vascular: Atherosclerotic calcification of the internal carotid arteries at the skull base. No abnormal hyperdensity of the major intracranial arteries or dural venous sinuses. Skull: The visualized skull base, calvarium and extracranial soft  tissues are normal. Sinuses/Orbits: No fluid levels or advanced mucosal thickening of the visualized paranasal sinuses. No mastoid or middle ear effusion. The orbits are normal. CT CERVICAL SPINE FINDINGS Alignment: No static subluxation. Facets are aligned. Occipital condyles are normally positioned. Skull base and vertebrae: No acute fracture. Soft tissues and spinal canal: No prevertebral fluid or swelling. No visible canal hematoma. Disc levels: No advanced spinal canal or neural foraminal stenosis. Upper chest: No pneumothorax, pulmonary nodule or pleural effusion. Other: Normal visualized paraspinal cervical soft tissues. IMPRESSION: 1. Generalized atrophy and chronic ischemic microangiopathy without acute intracranial abnormality. 2. No acute fracture or static subluxation of the cervical spine. Electronically Signed   By: Ulyses Jarred M.D.   On: 02/26/2019 03:04   CT CERVICAL SPINE WO CONTRAST  Result Date: 02/26/2019 CLINICAL DATA:  Head trauma EXAM: CT HEAD WITHOUT CONTRAST CT CERVICAL SPINE WITHOUT CONTRAST TECHNIQUE: Multidetector CT imaging of the head  and cervical spine was performed following the standard protocol without intravenous contrast. Multiplanar CT image reconstructions of the cervical spine were also generated. COMPARISON:  None. FINDINGS: CT HEAD FINDINGS Brain: There is no mass, hemorrhage or extra-axial collection. There is generalized atrophy without lobar predilection. There is hypoattenuation of the periventricular white matter, most commonly indicating chronic ischemic microangiopathy. Vascular: Atherosclerotic calcification of the internal carotid arteries at the skull base. No abnormal hyperdensity of the major intracranial arteries or dural venous sinuses. Skull: The visualized skull base, calvarium and extracranial soft tissues are normal. Sinuses/Orbits: No fluid levels or advanced mucosal thickening of the visualized paranasal sinuses. No mastoid or middle ear effusion. The orbits are normal. CT CERVICAL SPINE FINDINGS Alignment: No static subluxation. Facets are aligned. Occipital condyles are normally positioned. Skull base and vertebrae: No acute fracture. Soft tissues and spinal canal: No prevertebral fluid or swelling. No visible canal hematoma. Disc levels: No advanced spinal canal or neural foraminal stenosis. Upper chest: No pneumothorax, pulmonary nodule or pleural effusion. Other: Normal visualized paraspinal cervical soft tissues. IMPRESSION: 1. Generalized atrophy and chronic ischemic microangiopathy without acute intracranial abnormality. 2. No acute fracture or static subluxation of the cervical spine. Electronically Signed   By: Ulyses Jarred M.D.   On: 02/26/2019 03:04        Scheduled Meds: . aspirin  81 mg Oral Daily  . B-complex with vitamin C  1 tablet Per Tube Daily  . chlorhexidine  15 mL Mouth Rinse BID  . Chlorhexidine Gluconate Cloth  6 each Topical Q0600  . epoetin (EPOGEN/PROCRIT) injection  4,000 Units Intravenous Q M,W,F-HD  . feeding supplement (PRO-STAT SUGAR FREE 64)  30 mL Per Tube Daily  .  fluticasone  2 spray Each Nare Daily  . free water  30 mL Per Tube Q4H  . insulin aspart  0-9 Units Subcutaneous TID WC  . levothyroxine  88 mcg Oral QAC breakfast  . mouth rinse  15 mL Mouth Rinse q12n4p  . midodrine  10 mg Oral TID WC  . pantoprazole (PROTONIX) IV  40 mg Intravenous Q24H  . venlafaxine  75 mg Oral BID WC  . zinc sulfate  220 mg Oral Daily   Continuous Infusions: . sodium chloride Stopped (02/27/19 0916)  . sodium chloride    . DAPTOmycin (CUBICIN)  IV Stopped (02/26/19 2232)  . feeding supplement (OSMOLITE 1.5 CAL) 1,000 mL (02/27/19 0943)  . heparin 900 Units/hr (02/27/19 0956)  . potassium chloride 100 mL/hr at 02/27/19 0956  . potassium PHOSPHATE IVPB (in mmol)    .  vancomycin       LOS: 18 days   Time spent=25 mins    Leva Baine Arsenio Loader, MD [facility]  If 7PM-7AM, please contact night-coverage  02/27/2019, 10:06 AM

## 2019-02-27 NOTE — Progress Notes (Signed)
[facility]  ROUNDING NOTE   Subjective:   Hemodialysis treatment yesterday due to Christmas. Tolerated treatment well. UF of 518mL.   Husband at bedside. He reports her mental status has improved. However no significant change during my examination.   Objective:  Vital signs in last 24 hours:  Temp:  [98 F (36.7 C)-98.9 F (37.2 C)] 98.5 F (36.9 C) (12/25 0552) Pulse Rate:  [72-79] 75 (12/25 0552) Resp:  [8-20] 20 (12/25 0552) BP: (86-140)/(41-61) 113/41 (12/25 0552) SpO2:  [95 %-99 %] 99 % (12/25 0552)  Weight change:  Filed Weights   02/23/19 1333 02/25/19 0842 02/25/19 1200  Weight: 73.4 kg 74.1 kg 73.2 kg    Intake/Output: I/O last 3 completed shifts: In: 1639.7 [I.V.:207.4; NG/GT:1319.3; IV Piggyback:113] Out: 500 [Other:500]   Intake/Output this shift:  Total I/O In: 133.2 [I.V.:67.1; IV Piggyback:66] Out: -   Physical Exam: General: NAD, laying in bed  Head: OGT   Eyes: Anicteric, PERRL  Neck: Supple, trachea midline  Lungs:  Clear to auscultation  Heart: Regular rate and rhythm  Abdomen:  Soft, nontender,   Extremities: + peripheral edema.  Neurologic: Lethargic,responds to verbal stimuli.   Skin: No lesions  Access: Left AVG, bleeding over access    Basic Metabolic Panel: Recent Labs  Lab 02/23/19 0539 02/24/19 0241 02/25/19 0640 02/26/19 0231 02/27/19 0413  NA 143 140 140 141  140 141  K 2.9* 3.3* 3.5 3.3*  3.4* 3.0*  CL 106 104 105 100  100 98  CO2 21* 25 21* 26  22 31   GLUCOSE 197* 188* 194* 198*  198* 219*  BUN 17 9 16 9  10  6*  CREATININE 4.21* 2.45* 3.35* 2.29*  2.25* 1.54*  CALCIUM 8.0* 8.0* 8.1* 7.9*  7.8* 8.0*  MG 1.8 2.0 2.1 1.7 1.9  PHOS 3.7 2.8 3.5 1.9* 1.0*    Liver Function Tests: Recent Labs  Lab 02/26/19 0231 02/27/19 0413  AST 26 22  ALT 20 18  ALKPHOS 59 59  BILITOT 1.9* 1.7*  PROT 4.9* 5.0*  ALBUMIN 2.1*  2.1* 2.1*   No results for input(s): LIPASE, AMYLASE in the last 168  hours. No results for input(s): AMMONIA in the last 168 hours.  CBC: Recent Labs  Lab 02/25/19 0759 02/25/19 0845 02/26/19 0231 02/26/19 1413 02/27/19 0413  WBC 7.1 5.7 6.7 5.8 6.8  HGB 9.3* 9.0* 9.4* 8.9* 9.1*  HCT 28.0* 28.0*  27.6* 29.1* 27.2* 27.1*  MCV 87.5 88.9 89.8 89.2 87.1  PLT 166 171 179 155 170    Cardiac Enzymes: Recent Labs  Lab 02/23/19 0539  CKTOTAL 29*    BNP: Invalid input(s): POCBNP  CBG: Recent Labs  Lab 02/26/19 0757 02/26/19 1234 02/26/19 1729 02/26/19 2140 02/27/19 0801  GLUCAP 184* 173* 114* 138* 7*    Microbiology: Results for orders placed or performed during the hospital encounter of 02/09/19  Culture, blood (routine x 2)     Status: Abnormal   Collection Time: 02/09/19  2:29 PM   Specimen: BLOOD LEFT ARM  Result Value Ref Range Status   Specimen Description   Final    BLOOD LEFT ARM Performed at [facility], [address] [place], [place] [address]    Special Requests   Final    BOTTLES DRAWN AEROBIC AND ANAEROBIC Blood Culture adequate volume Performed at [facility], [address] [place], [place] [address]    Culture  Setup Time   Final    GRAM POSITIVE  COCCI IN BOTH AEROBIC AND ANAEROBIC BOTTLES CRITICAL VALUE NOTED.  VALUE IS CONSISTENT WITH PREVIOUSLY REPORTED AND CALLED VALUE. Performed at [facility], [address] [place], [place] [address]    Culture (A)  Final    STAPHYLOCOCCUS AUREUS SUSCEPTIBILITIES PERFORMED ON PREVIOUS CULTURE WITHIN THE LAST 5 DAYS. Performed at [facility], [place] [address] [place], [place] [address]    Report Status 02/12/2019 FINAL  Final  Culture, blood (routine x 2)     Status: Abnormal   Collection Time: 02/09/19  2:38 PM   Specimen: BLOOD LEFT ARM  Result Value Ref Range Status   Specimen Description   Final    BLOOD LEFT ARM Performed at [facility], [address] [place], [place] [address]    Special Requests    Final    BOTTLES DRAWN AEROBIC AND ANAEROBIC Blood Culture adequate volume Performed at [facility], [address], [place], [place] [address]    Culture  Setup Time   Final    GRAM POSITIVE COCCI IN BOTH AEROBIC AND ANAEROBIC BOTTLES CRITICAL RESULT CALLED TO, READ BACK BY AND VERIFIED WITH: Novinger [id number] 02/10/2019 HNM Performed at [facility], [place] [address], [place], [place] [address]    Culture METHICILLIN RESISTANT STAPHYLOCOCCUS AUREUS (A)  Final   Report Status 02/12/2019 FINAL  Final   Organism ID, Bacteria METHICILLIN RESISTANT STAPHYLOCOCCUS AUREUS  Final      Susceptibility   Methicillin resistant staphylococcus aureus - MIC*    CIPROFLOXACIN >=8 RESISTANT Resistant     ERYTHROMYCIN >=8 RESISTANT Resistant     GENTAMICIN <=0.5 SENSITIVE Sensitive     OXACILLIN >=4 RESISTANT Resistant     TETRACYCLINE <=1 SENSITIVE Sensitive     VANCOMYCIN 1 SENSITIVE Sensitive     TRIMETH/SULFA <=10 SENSITIVE Sensitive     CLINDAMYCIN RESISTANT Resistant     RIFAMPIN <=0.5 SENSITIVE Sensitive     Inducible Clindamycin POSITIVE Resistant     * METHICILLIN RESISTANT STAPHYLOCOCCUS AUREUS  Blood Culture ID Panel (Reflexed)     Status: Abnormal   Collection Time: 02/09/19  2:38 PM  Result Value Ref Range Status   Enterococcus species NOT DETECTED NOT DETECTED Final   Listeria monocytogenes NOT DETECTED NOT DETECTED Final   Staphylococcus species DETECTED (A) NOT DETECTED Final    Comment: CRITICAL RESULT CALLED TO, READ BACK BY AND VERIFIED WITH: Hart Robinsons PHARMD [id number] 02/10/2019 HNM    Staphylococcus aureus (BCID) DETECTED (A) NOT DETECTED Final    Comment: Methicillin (oxacillin)-resistant Staphylococcus aureus (MRSA). MRSA is predictably resistant to beta-lactam antibiotics (except ceftaroline). Preferred therapy is vancomycin unless clinically contraindicated. Patient requires contact precautions if  hospitalized. CRITICAL RESULT CALLED TO, READ BACK BY AND  VERIFIED WITH: Hart Robinsons PHARMD [id number] 02/10/2019 HNM    Methicillin resistance DETECTED (A) NOT DETECTED Final    Comment: CRITICAL RESULT CALLED TO, READ BACK BY AND VERIFIED WITH: Hart Robinsons PHARMD [id number] 02/10/2019 HNM    Streptococcus species NOT DETECTED NOT DETECTED Final   Streptococcus agalactiae NOT DETECTED NOT DETECTED Final   Streptococcus pneumoniae NOT DETECTED NOT DETECTED Final   Streptococcus pyogenes NOT DETECTED NOT DETECTED Final   Acinetobacter baumannii NOT DETECTED NOT DETECTED Final   Enterobacteriaceae species NOT DETECTED NOT DETECTED Final   Enterobacter cloacae complex NOT DETECTED NOT DETECTED Final   Escherichia coli NOT DETECTED NOT DETECTED Final   Klebsiella oxytoca NOT DETECTED NOT DETECTED Final   Klebsiella pneumoniae NOT DETECTED  NOT DETECTED Final   Proteus species NOT DETECTED NOT DETECTED Final   Serratia marcescens NOT DETECTED NOT DETECTED Final   Haemophilus influenzae NOT DETECTED NOT DETECTED Final   Neisseria meningitidis NOT DETECTED NOT DETECTED Final   Pseudomonas aeruginosa NOT DETECTED NOT DETECTED Final   Candida albicans NOT DETECTED NOT DETECTED Final   Candida glabrata NOT DETECTED NOT DETECTED Final   Candida krusei NOT DETECTED NOT DETECTED Final   Candida parapsilosis NOT DETECTED NOT DETECTED Final   Candida tropicalis NOT DETECTED NOT DETECTED Final    Comment: Performed at [facility], [place], [place] [address]  SARS CORONAVIRUS 2 (TAT 6-24 HRS) Nasopharyngeal Nasopharyngeal Swab     Status: None   Collection Time: 02/09/19  2:54 PM   Specimen: Nasopharyngeal Swab  Result Value Ref Range Status   SARS Coronavirus 2 NEGATIVE NEGATIVE Final    Comment: (NOTE) SARS-CoV-2 target nucleic acids are NOT DETECTED. The SARS-CoV-2 RNA is generally detectable in upper and lower respiratory specimens during the acute phase of infection. Negative results do not preclude SARS-CoV-2 infection, do not rule  out co-infections with other pathogens, and should not be used as the sole basis for treatment or other patient management decisions. Negative results must be combined with clinical observations, patient history, and epidemiological information. The expected result is Negative. Fact Sheet for Patients: SugarRoll.be Fact Sheet for Healthcare Providers: https://www.woods-mathews.com/ This test is not yet approved or cleared by the [place] FDA and  has been authorized for detection and/or diagnosis of SARS-CoV-2 by FDA under an Emergency Use Authorization (EUA). This EUA will remain  in effect (meaning this test can be used) for the duration of the COVID-19 declaration under Section 56 4(b)(1) of the Act, 21 U.S.C. section 360bbb-3(b)(1), unless the authorization is terminated or revoked sooner. Performed at [facility], [place] [address] [place], [place] [address]   MRSA PCR Screening     Status: Abnormal   Collection Time: 02/10/19  5:03 PM   Specimen: Nasopharyngeal  Result Value Ref Range Status   MRSA by PCR POSITIVE (A) NEGATIVE Final    Comment:        The GeneXpert MRSA Assay (FDA approved for NASAL specimens only), is one component of a comprehensive MRSA colonization surveillance program. It is not intended to diagnose MRSA infection nor to guide or monitor treatment for MRSA infections. RESULT CALLED TO, READ BACK BY AND VERIFIED WITH: ALEKSEY FRASER @1839  02/10/19 MJU Performed at [facility], [address] [place], [place] [address]   Culture, blood (routine x 2)     Status: Abnormal   Collection Time: 02/11/19 12:31 PM   Specimen: BLOOD  Result Value Ref Range Status   Specimen Description   Final    BLOOD PORTA CATH Performed at [place] [address] [place], [place] [address]    Special Requests   Final    BOTTLES DRAWN AEROBIC AND ANAEROBIC Blood Culture adequate  volume Performed at [facility], [address] [place], [place] [address]    Culture  Setup Time   Final    GRAM POSITIVE COCCI ANAEROBIC BOTTLE ONLY CRITICAL RESULT CALLED TO, READ BACK BY AND VERIFIED WITH: SCOTT HALL AT [id number] 02/12/2019 [facility]  Performed at [facility], [address] [place], [place] [address]    Culture (A)  Final    STAPHYLOCOCCUS AUREUS SUSCEPTIBILITIES PERFORMED ON PREVIOUS CULTURE WITHIN THE LAST 5 DAYS. Performed at  [facility], [place] [address], [place], [place] [address]    Report Status 02/13/2019 FINAL  Final  Culture, blood (routine x 2)     Status: None   Collection Time: 02/11/19  3:57 PM   Specimen: BLOOD  Result Value Ref Range Status   Specimen Description BLOOD PORTA CATH  Final   Special Requests   Final    BOTTLES DRAWN AEROBIC AND ANAEROBIC Blood Culture adequate volume   Culture   Final    NO GROWTH 5 DAYS Performed at [facility], [address] [place], [place] [address]    Report Status 02/16/2019 FINAL  Final  CULTURE, BLOOD (ROUTINE X 2) w Reflex to ID Panel     Status: Abnormal   Collection Time: 02/13/19  2:18 PM   Specimen: BLOOD LEFT HAND  Result Value Ref Range Status   Specimen Description BLOOD LEFT HAND  Final   Special Requests   Final    BOTTLES DRAWN AEROBIC AND ANAEROBIC Blood Culture results may not be optimal due to an inadequate volume of blood received in culture bottles   Culture  Setup Time   Final    AEROBIC BOTTLE ONLY GRAM POSITIVE COCCI IN CLUSTERS CRITICAL RESULT CALLED TO, READ BACK BY AND VERIFIED WITH: Herbert Pun AT [id number] ON 02/14/2019 [place]. Performed at [facility], [place] [address] [place], [place] [address]    Culture METHICILLIN RESISTANT STAPHYLOCOCCUS AUREUS (A)  Final   Report Status 02/16/2019 FINAL  Final   Organism ID, Bacteria METHICILLIN RESISTANT STAPHYLOCOCCUS AUREUS  Final      Susceptibility   Methicillin resistant staphylococcus aureus -  MIC*    CIPROFLOXACIN >=8 RESISTANT Resistant     ERYTHROMYCIN >=8 RESISTANT Resistant     GENTAMICIN <=0.5 SENSITIVE Sensitive     OXACILLIN >=4 RESISTANT Resistant     TETRACYCLINE <=1 SENSITIVE Sensitive     VANCOMYCIN <=0.5 SENSITIVE Sensitive     TRIMETH/SULFA <=10 SENSITIVE Sensitive     CLINDAMYCIN RESISTANT Resistant     RIFAMPIN <=0.5 SENSITIVE Sensitive     Inducible Clindamycin POSITIVE Resistant     * METHICILLIN RESISTANT STAPHYLOCOCCUS AUREUS  Blood Culture ID Panel (Reflexed)     Status: Abnormal   Collection Time: 02/13/19  2:18 PM  Result Value Ref Range Status   Enterococcus species NOT DETECTED NOT DETECTED Final   Listeria monocytogenes NOT DETECTED NOT DETECTED Final   Staphylococcus species DETECTED (A) NOT DETECTED Final    Comment: CRITICAL RESULT CALLED TO, READ BACK BY AND VERIFIED WITH: ABBEY ELLINGTON AT [id number] ON 02/14/2019 [place].    Staphylococcus aureus (BCID) DETECTED (A) NOT DETECTED Final    Comment: Methicillin (oxacillin)-resistant Staphylococcus aureus (MRSA). MRSA is predictably resistant to beta-lactam antibiotics (except ceftaroline). Preferred therapy is vancomycin unless clinically contraindicated. Patient requires contact precautions if  hospitalized. CRITICAL RESULT CALLED TO, READ BACK BY AND VERIFIED WITH: Herbert Pun AT [id number] ON 02/14/2019 [place].    Methicillin resistance DETECTED (A) NOT DETECTED Final    Comment: CRITICAL RESULT CALLED TO, READ BACK BY AND VERIFIED WITH: Herbert Pun AT [id number] ON 02/14/2019 [place].    Streptococcus species NOT DETECTED NOT DETECTED Final   Streptococcus agalactiae NOT DETECTED NOT DETECTED Final   Streptococcus pneumoniae NOT DETECTED NOT DETECTED Final   Streptococcus pyogenes NOT DETECTED NOT DETECTED Final   Acinetobacter baumannii NOT DETECTED NOT DETECTED Final   Enterobacteriaceae species NOT DETECTED NOT DETECTED Final   Enterobacter cloacae complex NOT DETECTED  NOT DETECTED Final   Escherichia  coli NOT DETECTED NOT DETECTED Final   Klebsiella oxytoca NOT DETECTED NOT DETECTED Final   Klebsiella pneumoniae NOT DETECTED NOT DETECTED Final   Proteus species NOT DETECTED NOT DETECTED Final   Serratia marcescens NOT DETECTED NOT DETECTED Final   Haemophilus influenzae NOT DETECTED NOT DETECTED Final   Neisseria meningitidis NOT DETECTED NOT DETECTED Final   Pseudomonas aeruginosa NOT DETECTED NOT DETECTED Final   Candida albicans NOT DETECTED NOT DETECTED Final   Candida glabrata NOT DETECTED NOT DETECTED Final   Candida krusei NOT DETECTED NOT DETECTED Final   Candida parapsilosis NOT DETECTED NOT DETECTED Final   Candida tropicalis NOT DETECTED NOT DETECTED Final    Comment: Performed at [facility], [address] [place], [place] [address]  CULTURE, BLOOD (ROUTINE X 2) w Reflex to ID Panel     Status: None   Collection Time: 02/13/19  3:09 PM   Specimen: BLOOD  Result Value Ref Range Status   Specimen Description BLOOD BLOOD LEFT HAND  Final   Special Requests   Final    BOTTLES DRAWN AEROBIC AND ANAEROBIC Blood Culture adequate volume   Culture   Final    NO GROWTH 5 DAYS Performed at [facility], [address], [place], [place] [address]    Report Status 02/18/2019 FINAL  Final  Culture, blood (routine x 2)     Status: Abnormal   Collection Time: 02/15/19 12:49 PM   Specimen: BLOOD  Result Value Ref Range Status   Specimen Description   Final    BLOOD LT HAND Performed at [facility], [address] [place], [place] [address]    Special Requests   Final    BOTTLES DRAWN AEROBIC AND ANAEROBIC Blood Culture results may not be optimal due to an inadequate volume of blood received in culture bottles Performed at [facility], [address] [place], [place] [address]    Culture  Setup Time   Final    GRAM POSITIVE COCCI IN BOTH AEROBIC AND ANAEROBIC BOTTLES CRITICAL RESULT CALLED TO, READ BACK BY AND VERIFIED  WITHEleonore Chiquito AT [id number] 02/16/2019 SDR Performed at [facility], [address] [place], [place] [address]    Culture (A)  Final    METHICILLIN RESISTANT STAPHYLOCOCCUS AUREUS STAPHYLOCOCCUS SPECIES (COAGULASE NEGATIVE) THE SIGNIFICANCE OF ISOLATING THIS ORGANISM FROM A SINGLE SET OF BLOOD CULTURES WHEN MULTIPLE SETS ARE DRAWN IS UNCERTAIN. PLEASE NOTIFY THE MICROBIOLOGY DEPARTMENT WITHIN ONE WEEK IF SPECIATION AND SENSITIVITIES ARE REQUIRED. Performed at [facility], [place] [address], [place], [place] [address]    Report Status 02/18/2019 FINAL  Final   Organism ID, Bacteria METHICILLIN RESISTANT STAPHYLOCOCCUS AUREUS  Final      Susceptibility   Methicillin resistant staphylococcus aureus - MIC*    CIPROFLOXACIN >=8 RESISTANT Resistant     ERYTHROMYCIN >=8 RESISTANT Resistant     GENTAMICIN <=0.5 SENSITIVE Sensitive     OXACILLIN >=4 RESISTANT Resistant     TETRACYCLINE <=1 SENSITIVE Sensitive     VANCOMYCIN <=0.5 SENSITIVE Sensitive     TRIMETH/SULFA <=10 SENSITIVE Sensitive     CLINDAMYCIN RESISTANT Resistant     RIFAMPIN <=0.5 SENSITIVE Sensitive     Inducible Clindamycin POSITIVE Resistant     * METHICILLIN RESISTANT STAPHYLOCOCCUS AUREUS  Aerobic Culture (superficial specimen)     Status: None   Collection Time: 02/17/19  1:31 PM   Specimen: Heel; Wound  Result Value Ref  Range Status   Specimen Description   Final    HEEL Performed at [facility], [address], [place], [place] [address]    Special Requests   Final    NONE Performed at [facility], [place], [place] [address]    Gram Stain   Final    NO WBC SEEN MODERATE GRAM POSITIVE COCCI IN PAIRS    Culture   Final    ABUNDANT METHICILLIN RESISTANT STAPHYLOCOCCUS AUREUS ABUNDANT DIPHTHEROIDS(CORYNEBACTERIUM SPECIES) Standardized susceptibility testing for this organism is not available. Performed at [facility], [place] [address], [place],  [place] [address]    Report Status 02/19/2019 FINAL  Final   Organism ID, Bacteria METHICILLIN RESISTANT STAPHYLOCOCCUS AUREUS  Final      Susceptibility   Methicillin resistant staphylococcus aureus - MIC*    CIPROFLOXACIN >=8 RESISTANT Resistant     ERYTHROMYCIN >=8 RESISTANT Resistant     GENTAMICIN <=0.5 SENSITIVE Sensitive     OXACILLIN >=4 RESISTANT Resistant     TETRACYCLINE <=1 SENSITIVE Sensitive     VANCOMYCIN 1 SENSITIVE Sensitive     TRIMETH/SULFA <=10 SENSITIVE Sensitive     CLINDAMYCIN RESISTANT Resistant     RIFAMPIN <=0.5 SENSITIVE Sensitive     Inducible Clindamycin POSITIVE Resistant     * ABUNDANT METHICILLIN RESISTANT STAPHYLOCOCCUS AUREUS  CULTURE, BLOOD (ROUTINE X 2) w Reflex to ID Panel     Status: None   Collection Time: 02/18/19 11:29 PM   Specimen: BLOOD  Result Value Ref Range Status   Specimen Description BLOOD LEFT ARM  Final   Special Requests   Final    BOTTLES DRAWN AEROBIC AND ANAEROBIC Blood Culture adequate volume   Culture   Final    NO GROWTH 5 DAYS Performed at [facility], [address] [place], [place] [address]    Report Status 02/23/2019 FINAL  Final  CULTURE, BLOOD (ROUTINE X 2) w Reflex to ID Panel     Status: None   Collection Time: 02/18/19 11:29 PM   Specimen: BLOOD  Result Value Ref Range Status   Specimen Description BLOOD LEFT ARM  Final   Special Requests   Final    BOTTLES DRAWN AEROBIC AND ANAEROBIC Blood Culture adequate volume   Culture   Final    NO GROWTH 5 DAYS Performed at [facility], [address], [place], [place] [address]    Report Status 02/23/2019 FINAL  Final    Coagulation Studies: No results for input(s): LABPROT, INR in the last 72 hours.  Urinalysis: No results for input(s): COLORURINE, LABSPEC, PHURINE, GLUCOSEU, HGBUR, BILIRUBINUR, KETONESUR, PROTEINUR, UROBILINOGEN, NITRITE, LEUKOCYTESUR in the last 72 hours.  Invalid input(s): APPERANCEUR    Imaging: CT HEAD WO  CONTRAST  Result Date: 02/26/2019 CLINICAL DATA:  Head trauma EXAM: CT HEAD WITHOUT CONTRAST CT CERVICAL SPINE WITHOUT CONTRAST TECHNIQUE: Multidetector CT imaging of the head and cervical spine was performed following the standard protocol without intravenous contrast. Multiplanar CT image reconstructions of the cervical spine were also generated. COMPARISON:  None. FINDINGS: CT HEAD FINDINGS Brain: There is no mass, hemorrhage or extra-axial collection. There is generalized atrophy without lobar predilection. There is hypoattenuation of the periventricular white matter, most commonly indicating chronic ischemic microangiopathy. Vascular: Atherosclerotic calcification of the internal carotid arteries at the skull base. No abnormal hyperdensity of the major intracranial arteries or dural venous sinuses. Skull: The visualized skull base, calvarium and extracranial soft tissues are normal. Sinuses/Orbits: No fluid  levels or advanced mucosal thickening of the visualized paranasal sinuses. No mastoid or middle ear effusion. The orbits are normal. CT CERVICAL SPINE FINDINGS Alignment: No static subluxation. Facets are aligned. Occipital condyles are normally positioned. Skull base and vertebrae: No acute fracture. Soft tissues and spinal canal: No prevertebral fluid or swelling. No visible canal hematoma. Disc levels: No advanced spinal canal or neural foraminal stenosis. Upper chest: No pneumothorax, pulmonary nodule or pleural effusion. Other: Normal visualized paraspinal cervical soft tissues. IMPRESSION: 1. Generalized atrophy and chronic ischemic microangiopathy without acute intracranial abnormality. 2. No acute fracture or static subluxation of the cervical spine. Electronically Signed   By: Ulyses Jarred M.D.   On: 02/26/2019 03:04   CT CERVICAL SPINE WO CONTRAST  Result Date: 02/26/2019 CLINICAL DATA:  Head trauma EXAM: CT HEAD WITHOUT CONTRAST CT CERVICAL SPINE WITHOUT CONTRAST TECHNIQUE: Multidetector  CT imaging of the head and cervical spine was performed following the standard protocol without intravenous contrast. Multiplanar CT image reconstructions of the cervical spine were also generated. COMPARISON:  None. FINDINGS: CT HEAD FINDINGS Brain: There is no mass, hemorrhage or extra-axial collection. There is generalized atrophy without lobar predilection. There is hypoattenuation of the periventricular white matter, most commonly indicating chronic ischemic microangiopathy. Vascular: Atherosclerotic calcification of the internal carotid arteries at the skull base. No abnormal hyperdensity of the major intracranial arteries or dural venous sinuses. Skull: The visualized skull base, calvarium and extracranial soft tissues are normal. Sinuses/Orbits: No fluid levels or advanced mucosal thickening of the visualized paranasal sinuses. No mastoid or middle ear effusion. The orbits are normal. CT CERVICAL SPINE FINDINGS Alignment: No static subluxation. Facets are aligned. Occipital condyles are normally positioned. Skull base and vertebrae: No acute fracture. Soft tissues and spinal canal: No prevertebral fluid or swelling. No visible canal hematoma. Disc levels: No advanced spinal canal or neural foraminal stenosis. Upper chest: No pneumothorax, pulmonary nodule or pleural effusion. Other: Normal visualized paraspinal cervical soft tissues. IMPRESSION: 1. Generalized atrophy and chronic ischemic microangiopathy without acute intracranial abnormality. 2. No acute fracture or static subluxation of the cervical spine. Electronically Signed   By: Ulyses Jarred M.D.   On: 02/26/2019 03:04     Medications:   . sodium chloride Stopped (02/27/19 0916)  . sodium chloride    . DAPTOmycin (CUBICIN)  IV Stopped (02/26/19 2232)  . feeding supplement (OSMOLITE 1.5 CAL) 1,000 mL (02/27/19 0943)  . heparin 900 Units/hr (02/27/19 0956)  . potassium chloride 10 mEq (02/27/19 1018)  . potassium PHOSPHATE IVPB (in mmol) 30  mmol (02/27/19 1016)  . vancomycin     . aspirin  81 mg Oral Daily  . B-complex with vitamin C  1 tablet Per Tube Daily  . chlorhexidine  15 mL Mouth Rinse BID  . Chlorhexidine Gluconate Cloth  6 each Topical Q0600  . epoetin (EPOGEN/PROCRIT) injection  4,000 Units Intravenous Q M,W,F-HD  . feeding supplement (PRO-STAT SUGAR FREE 64)  30 mL Per Tube Daily  . fluticasone  2 spray Each Nare Daily  . free water  30 mL Per Tube Q4H  . insulin aspart  0-9 Units Subcutaneous TID WC  . levothyroxine  88 mcg Oral QAC breakfast  . mouth rinse  15 mL Mouth Rinse q12n4p  . midodrine  10 mg Oral TID WC  . pantoprazole (PROTONIX) IV  40 mg Intravenous Q24H  . venlafaxine  75 mg Oral BID WC  . zinc sulfate  220 mg Oral Daily   sodium chloride, sodium chloride,  haloperidol lactate, ondansetron (ZOFRAN) IV  Assessment/ Plan:  Jamie Arias is a 63 y.o. white female with end-stage renal disease on hemodialysis, atrial fibrillation, chronic diastolic congestive heart failure, coronary artery disease status post CABG, diabetes mellitus type II, hypertension, hyperlipidemia, hypothyroidism, Covid positive in July 2020, recent nursing home stay in [place] Prolonged hospitalization for MRSA bacteremia/sepsis and now with acute encephalopathy  [facility] MWF [facility] [address] Left AVG 81.5kg  1. End Stage Renal Disease: on hemodialysis. With complication of dialysis device. Angiogram on 12/21 shows no communication with bleed to AVG.  Placed tunneled catheter until her AVG site has healed. -  MWF schedule .  Next dialysis for tomorrow.   2. Hypotension - midodrine  3. Anemia of chronic kidney disease:   Status post PRBC transfusion on 12/22 - EPO with HD treatments.   4. Secondary Hyperparathyroidism: not currently on binders.   5. Acute Encephalopathy: improving off ceftaroline.  - Appreciate neurology input.    LOS: [place] 12/25/202010:57 AM

## 2019-02-27 NOTE — Consult Note (Signed)
ANTICOAGULATION CONSULT NOTE  Pharmacy Consult for heparin Indication: DVT  Patient Measurements: Height: 5\' 4"  (162.6 cm) Weight: 161 lb 6 oz (73.2 kg) IBW/kg (Calculated) : 54.7 Heparin Dosing Weight: 71 kg  Vital Signs: Temp: 99.4 F (37.4 C) (12/25 2040) Temp Source: Oral (12/25 2040) BP: 107/44 (12/25 2040) Pulse Rate: 80 (12/25 2040)  Labs: Recent Labs    02/25/19 0640 02/25/19 0759 02/26/19 0231 02/26/19 1413 02/27/19 0413 02/27/19 1356 02/27/19 2020  HGB  --   --  9.4* 8.9* 9.1*  --   --   HCT  --    < > 29.1* 27.2* 27.1*  --   --   PLT  --   --  179 155 170  --   --   HEPARINUNFRC 0.57   < > 0.26*  --  0.28* 0.47 0.36  CREATININE 3.35*  --  2.29*  2.25*  --  1.54*  --   --    < > = values in this interval not displayed.   Estimated Creatinine Clearance: 35.7 mL/min (A) (by C-G formula based on SCr of 1.54 mg/dL (H)).  Medical History: Past Medical History:  Diagnosis Date  . Anemia   . Chronic diastolic CHF (congestive heart failure) ([place])    a. Due to ischemic cardiomyopathy. EF as low as 35%, improved to normal s/p CABG; b. echo 07/06/13: EF 55-60%, no RWMAs, mod TR, trivial pericardial effusion not c/w tamponade physiology;  c. 10/2015 Echo: EF 65%, Gr1 DD, triv AI, mild MR, mildly dil LA, mod TR, PASP 65mmHg.  Marland Kitchen Coronary artery disease    a. NSTEMI 06/2013; b.cath: severe three-vessel CAD w/ EF 30% & mild-mod MR; c. s/p 3 vessel CABG 07/02/13 (LIMA-LAD, SVG-OM, and SVG-RPDA);  d. 10/2015 MV: no ischemia/infarct.  . Diabetes mellitus without complication ([place])   . Diabetic neuropathy ([place])   . Dialysis patient [facility])    MWF  . ESRD (end stage renal disease) ([place])    a. 12/2015 initiated - mwf dialysis.  Marland Kitchen GERD (gastroesophageal reflux disease)   . Hyperlipidemia   . Hypertension   . Hypothyroidism   . Myocardial infarction ([place]) 2015  . Neuropathy   . Pleural effusion 2015  . Pulmonary hypertension ([place])   . Renal insufficiency   . Wears dentures     full lower    Medications:  Scheduled:  . aspirin  81 mg Oral Daily  . B-complex with vitamin C  1 tablet Per Tube Daily  . chlorhexidine  15 mL Mouth Rinse BID  . Chlorhexidine Gluconate Cloth  6 each Topical Q0600  . epoetin (EPOGEN/PROCRIT) injection  4,000 Units Intravenous Q M,W,F-HD  . feeding supplement (PRO-STAT SUGAR FREE 64)  30 mL Per Tube Daily  . fluticasone  2 spray Each Nare Daily  . free water  30 mL Per Tube Q4H  . insulin aspart  0-9 Units Subcutaneous TID WC  . levothyroxine  88 mcg Oral QAC breakfast  . mouth rinse  15 mL Mouth Rinse q12n4p  . midodrine  10 mg Oral TID WC  . pantoprazole (PROTONIX) IV  40 mg Intravenous Q24H  . venlafaxine  75 mg Oral BID WC  . zinc sulfate  220 mg Oral Daily    Assessment: 63 y.o. female admitted on 02/09/2019 with MRSA  bacteremia. She was on apixaban PTA but last dose was prior to admission (12/7). [place] 12/15 revealed age-indeterminate short segment occlusive DVT involving the mid humeral aspect of one of the paired  brachial veins.  Heparin was started but after repeated attempts by multiple phlebotomists lab was unable to draw labs required for heparin monitoring. Additionally, she was too confused and disoriented to swallow oral medications. On 12/20 a femoral central line was placed. Heparin was stopped this morning due to a fall and is being restarted upon returning from HD  Heparin Course: 12/24 0231 HL 0.26: held due to unwitnessed fall and bleeding from arm 12/25 0413 HL 0.28: subtherapeutic - CBC stable.  Will increase Heparin to 900 units/hr and recheck in 8 hours. 12/25 1356 HL 0.47: therapeutic  12/25 2020 HL 0.36: therapeutic x 2  Goal of Therapy:  Heparin Level: 0.3 - 0.7 units/mL Monitor platelets by anticoagulation protocol: Yes   Plan:   Heparin level therapeutic x 2  Continue heparin at 900 units/hr  Recheck HL with AM labs  CBC in am  Pearla Dubonnet, PharmD Clinical Pharmacist 02/27/2019 8:47  PM

## 2019-02-27 NOTE — Consult Note (Signed)
ANTICOAGULATION CONSULT NOTE  Pharmacy Consult for heparin Indication: DVT  Patient Measurements: Height: 5\' 4"  (162.6 cm) Weight: 161 lb 6 oz (73.2 kg) IBW/kg (Calculated) : 54.7 Heparin Dosing Weight: 71 kg  Vital Signs: Temp: 97.6 F (36.4 C) (12/25 1134) Temp Source: Axillary (12/25 1134) BP: 156/96 (12/25 1134) Pulse Rate: 104 (12/25 1134)  Labs: Recent Labs    02/25/19 0640 02/25/19 0759 02/26/19 0231 02/26/19 1413 02/27/19 0413 02/27/19 1356  HGB  --   --  9.4* 8.9* 9.1*  --   HCT  --    < > 29.1* 27.2* 27.1*  --   PLT  --   --  179 155 170  --   HEPARINUNFRC 0.57   < > 0.26*  --  0.28* 0.47  CREATININE 3.35*  --  2.29*  2.25*  --  1.54*  --    < > = values in this interval not displayed.   Estimated Creatinine Clearance: 35.7 mL/min (A) (by C-G formula based on SCr of 1.54 mg/dL (H)).  Medical History: Past Medical History:  Diagnosis Date  . Anemia   . Chronic diastolic CHF (congestive heart failure) ([place])    a. Due to ischemic cardiomyopathy. EF as low as 35%, improved to normal s/p CABG; b. echo 07/06/13: EF 55-60%, no RWMAs, mod TR, trivial pericardial effusion not c/w tamponade physiology;  c. 10/2015 Echo: EF 65%, Gr1 DD, triv AI, mild MR, mildly dil LA, mod TR, PASP 7mmHg.  Marland Kitchen Coronary artery disease    a. NSTEMI 06/2013; b.cath: severe three-vessel CAD w/ EF 30% & mild-mod MR; c. s/p 3 vessel CABG 07/02/13 (LIMA-LAD, SVG-OM, and SVG-RPDA);  d. 10/2015 MV: no ischemia/infarct.  . Diabetes mellitus without complication ([place])   . Diabetic neuropathy ([place])   . Dialysis patient [facility])    MWF  . ESRD (end stage renal disease) ([place])    a. 12/2015 initiated - mwf dialysis.  Marland Kitchen GERD (gastroesophageal reflux disease)   . Hyperlipidemia   . Hypertension   . Hypothyroidism   . Myocardial infarction ([place]) 2015  . Neuropathy   . Pleural effusion 2015  . Pulmonary hypertension ([place])   . Renal insufficiency   . Wears dentures    full lower    Medications:   Scheduled:  . aspirin  81 mg Oral Daily  . B-complex with vitamin C  1 tablet Per Tube Daily  . chlorhexidine  15 mL Mouth Rinse BID  . Chlorhexidine Gluconate Cloth  6 each Topical Q0600  . epoetin (EPOGEN/PROCRIT) injection  4,000 Units Intravenous Q M,W,F-HD  . feeding supplement (PRO-STAT SUGAR FREE 64)  30 mL Per Tube Daily  . fluticasone  2 spray Each Nare Daily  . free water  30 mL Per Tube Q4H  . insulin aspart  0-9 Units Subcutaneous TID WC  . levothyroxine  88 mcg Oral QAC breakfast  . mouth rinse  15 mL Mouth Rinse q12n4p  . midodrine  10 mg Oral TID WC  . pantoprazole (PROTONIX) IV  40 mg Intravenous Q24H  . venlafaxine  75 mg Oral BID WC  . zinc sulfate  220 mg Oral Daily    Assessment: 63 y.o. female admitted on 02/09/2019 with MRSA  bacteremia. She was on apixaban PTA but last dose was prior to admission (12/7). [place] 12/15 revealed age-indeterminate short segment occlusive DVT involving the mid humeral aspect of one of the paired brachial veins.  Heparin was started but after repeated attempts by multiple phlebotomists lab was  unable to draw labs required for heparin monitoring. Additionally, she was too confused and disoriented to swallow oral medications. On 12/20 a femoral central line was placed. Heparin was stopped this morning due to a fall and is being restarted upon returning from HD  Heparin Course: 12/24 0231 HL 0.26: held due to unwitnessed fall and bleeding from arm 12/25 0413 HL 0.28: subtherapeutic - CBC stable.  Will increase Heparin to 900 units/hr and recheck in 8 hours. 12/25 1356 HL 0.47: therapeutic   Goal of Therapy:  Heparin Level: 0.3 - 0.7 units/mL Monitor platelets by anticoagulation protocol: Yes   Plan:   Heparin level therapeutic.   Continue heparin at 900 units/hr  Recheck HL in 6 hours  CBC in am  Oswald Hillock, PharmD, BCPS Clinical Pharmacist 02/27/2019 2:23 PM

## 2019-02-28 LAB — COMPREHENSIVE METABOLIC PANEL
ALT: 19 U/L (ref 0–44)
AST: 22 U/L (ref 15–41)
Albumin: 2 g/dL — ABNORMAL LOW (ref 3.5–5.0)
Alkaline Phosphatase: 63 U/L (ref 38–126)
Anion gap: 12 (ref 5–15)
BUN: 15 mg/dL (ref 8–23)
CO2: 29 mmol/L (ref 22–32)
Calcium: 8 mg/dL — ABNORMAL LOW (ref 8.9–10.3)
Chloride: 97 mmol/L — ABNORMAL LOW (ref 98–111)
Creatinine, Ser: 2.46 mg/dL — ABNORMAL HIGH (ref 0.44–1.00)
GFR calc Af Amer: 23 mL/min — ABNORMAL LOW (ref >60)
GFR calc non Af Amer: 20 mL/min — ABNORMAL LOW (ref >60)
Glucose, Bld: 365 mg/dL — ABNORMAL HIGH (ref 70–99)
Potassium: 3.4 mmol/L — ABNORMAL LOW (ref 3.5–5.1)
Sodium: 138 mmol/L (ref 135–145)
Total Bilirubin: 1 mg/dL (ref 0.3–1.2)
Total Protein: 4.9 g/dL — ABNORMAL LOW (ref 6.5–8.1)

## 2019-02-28 LAB — CBC
HCT: 25.3 % — ABNORMAL LOW (ref 36.0–46.0)
Hemoglobin: 8.6 g/dL — ABNORMAL LOW (ref 12.0–15.0)
MCH: 29.8 pg (ref 26.0–34.0)
MCHC: 34 g/dL (ref 30.0–36.0)
MCV: 87.5 fL (ref 80.0–100.0)
Platelets: 182 10*3/uL (ref 150–400)
RBC: 2.89 MIL/uL — ABNORMAL LOW (ref 3.87–5.11)
RDW: 20.7 % — ABNORMAL HIGH (ref 11.5–15.5)
WBC: 6.6 10*3/uL (ref 4.0–10.5)
nRBC: 0 % (ref 0.0–0.2)

## 2019-02-28 LAB — HEPARIN LEVEL (UNFRACTIONATED)
Heparin Unfractionated: 0.16 IU/mL — ABNORMAL LOW (ref 0.30–0.70)
Heparin Unfractionated: 0.54 IU/mL (ref 0.30–0.70)
Heparin Unfractionated: 0.51 IU/mL (ref 0.30–0.70)

## 2019-02-28 LAB — GLUCOSE, CAPILLARY
Glucose-Capillary: 366 mg/dL — ABNORMAL HIGH (ref 70–99)
Glucose-Capillary: 285 mg/dL — ABNORMAL HIGH (ref 70–99)
Glucose-Capillary: 229 mg/dL — ABNORMAL HIGH (ref 70–99)
Glucose-Capillary: 184 mg/dL — ABNORMAL HIGH (ref 70–99)
Glucose-Capillary: 203 mg/dL — ABNORMAL HIGH (ref 70–99)

## 2019-02-28 LAB — MAGNESIUM: Magnesium: 1.8 mg/dL (ref 1.7–2.4)

## 2019-02-28 LAB — PHOSPHORUS: Phosphorus: 2.1 mg/dL — ABNORMAL LOW (ref 2.5–4.6)

## 2019-02-28 MED ORDER — INSULIN GLARGINE 100 UNIT/ML ~~LOC~~ SOLN
10.0000 [IU] | Freq: Every day | SUBCUTANEOUS | Status: DC
  Administered 2019-02-28 – 2019-03-03 (×4): 10 [IU] via SUBCUTANEOUS
  Filled 2019-02-28 (×4): qty 0.1

## 2019-02-28 MED ORDER — HEPARIN BOLUS VIA INFUSION
2000.0000 [IU] | Freq: Once | INTRAVENOUS | Status: AC
  Administered 2019-02-28: 2000 [IU] via INTRAVENOUS
  Filled 2019-02-28: qty 2000

## 2019-02-28 MED ORDER — POTASSIUM PHOSPHATES 15 MMOLE/5ML IV SOLN
20.0000 mmol | Freq: Once | INTRAVENOUS | Status: AC
  Administered 2019-02-28: 20 mmol via INTRAVENOUS
  Filled 2019-02-28: qty 6.67

## 2019-02-28 MED ORDER — INSULIN ASPART 100 UNIT/ML ~~LOC~~ SOLN
0.0000 [IU] | Freq: Three times a day (TID) | SUBCUTANEOUS | Status: DC
  Administered 2019-02-28: 8 [IU] via SUBCUTANEOUS
  Administered 2019-02-28: 5 [IU] via SUBCUTANEOUS
  Administered 2019-03-01: 3 [IU] via SUBCUTANEOUS
  Administered 2019-03-01: 8 [IU] via SUBCUTANEOUS
  Filled 2019-02-28 (×4): qty 1

## 2019-02-28 MED ORDER — MAGNESIUM SULFATE IN D5W 1-5 GM/100ML-% IV SOLN
1.0000 g | Freq: Once | INTRAVENOUS | Status: AC
  Administered 2019-02-28: 1 g via INTRAVENOUS
  Filled 2019-02-28: qty 100

## 2019-02-28 MED ORDER — POTASSIUM CHLORIDE 10 MEQ/100ML IV SOLN
10.0000 meq | INTRAVENOUS | Status: AC
  Administered 2019-02-28 (×4): 10 meq via INTRAVENOUS
  Filled 2019-02-28 (×4): qty 100

## 2019-02-28 NOTE — Progress Notes (Signed)
Pharmacy Electrolyte Monitoring Consult:  Pharmacy consulted to assist in monitoring and replacing electrolytes in this 63 y.o. female admitted on 02/09/2019. Patient with ESRD; last dialysis 12/14. Patient with MRSA bacteremia. She has a h/o CAD and is s/p CABG. Hemodialysis MWF.   Labs:  Sodium (mmol/L)  Date Value  02/28/2019 138  03/30/2014 140   Potassium (mmol/L)  Date Value  02/28/2019 3.4 (L)  03/30/2014 4.3   Magnesium (mg/dL)  Date Value  02/28/2019 1.8  07/19/2013 1.7 (L)   Phosphorus (mg/dL)  Date Value  02/28/2019 2.1 (L)   Calcium (mg/dL)  Date Value  02/28/2019 8.0 (L)   Calcium, Total (mg/dL)  Date Value  03/30/2014 9.0   Albumin (g/dL)  Date Value  02/28/2019 2.0 (L)  02/11/2018 4.2  07/23/2013 2.7 (L)   Corrected Ca: 10.5 mg/dL  Goals of Therapy Given Cardiac History: Potassium 4.0 - 5.1 mmol/L Magnesium 2.0 - 2.4 mg/dL All Other Electrolytes WNL   Plan:  K+ 3.4: pt to receive ~30 meq from KPhos  Phos 2.1 - Will order Kphos IV 20 mmol x 1.   Mg 1.8 - will order Magnesium sulfate 1 gram IV x 1  (KPhos and Magnesium are conservative dosing as pt is on Hemodialysis)  CMP, magnesium level in am  Pharmacy will continue to monitor and adjust per consult.   Chinita Greenland PharmD Clinical Pharmacist 02/28/2019

## 2019-02-28 NOTE — Evaluation (Signed)
Clinical/Bedside Swallow Evaluation Patient Details  Name: Jamie Arias MRN: [id number] Date of Birth: 12/26/1955  Today's Date: 02/28/2019 Time: SLP Start Time (ACUTE ONLY): 1045 SLP Stop Time (ACUTE ONLY): 1135 SLP Time Calculation (min) (ACUTE ONLY): 50 min  Past Medical History:  Past Medical History:  Diagnosis Date  . Anemia   . Chronic diastolic CHF (congestive heart failure) ([place])    a. Due to ischemic cardiomyopathy. EF as low as 35%, improved to normal s/p CABG; b. echo 07/06/13: EF 55-60%, no RWMAs, mod TR, trivial pericardial effusion not c/w tamponade physiology;  c. 10/2015 Echo: EF 65%, Gr1 DD, triv AI, mild MR, mildly dil LA, mod TR, PASP 27mmHg.  Marland Kitchen Coronary artery disease    a. NSTEMI 06/2013; b.cath: severe three-vessel CAD w/ EF 30% & mild-mod MR; c. s/p 3 vessel CABG 07/02/13 (LIMA-LAD, SVG-OM, and SVG-RPDA);  d. 10/2015 MV: no ischemia/infarct.  . Diabetes mellitus without complication ([place])   . Diabetic neuropathy ([place])   . Dialysis patient [facility])    MWF  . ESRD (end stage renal disease) ([place])    a. 12/2015 initiated - mwf dialysis.  Marland Kitchen GERD (gastroesophageal reflux disease)   . Hyperlipidemia   . Hypertension   . Hypothyroidism   . Myocardial infarction ([place]) 2015  . Neuropathy   . Pleural effusion 2015  . Pulmonary hypertension ([place])   . Renal insufficiency   . Wears dentures    full lower   Past Surgical History:  Past Surgical History:  Procedure Laterality Date  . A/V FISTULAGRAM Right 12/17/2016   Procedure: A/V Fistulagram;  Surgeon: Algernon Huxley, MD;  Location: [place] CV LAB;  Service: Cardiovascular;  Laterality: Right;  . A/V FISTULAGRAM Right 01/07/2017   Procedure: A/V Fistulagram;  Surgeon: Algernon Huxley, MD;  Location: [place] CV LAB;  Service: Cardiovascular;  Laterality: Right;  . A/V FISTULAGRAM Right 12/03/2017   Procedure: A/V FISTULAGRAM;  Surgeon: Katha Cabal, MD;  Location: [place] CV [facility];  Service: Cardiovascular;   Laterality: Right;  . A/V FISTULAGRAM Right 02/23/2019   Procedure: A/V Fistulagram;  Surgeon: Algernon Huxley, MD;  Location: [place] CV LAB;  Service: Cardiovascular;  Laterality: Right;  . A/V SHUNT INTERVENTION N/A 02/22/2017   Procedure: A/V SHUNT INTERVENTION;  Surgeon: Algernon Huxley, MD;  Location: [place] CV LAB;  Service: Cardiovascular;  Laterality: N/A;  . AV FISTULA PLACEMENT Right 02/03/2016   Procedure: INSERTION OF ARTERIOVENOUS (AV) GORE-TEX GRAFT ARM ( BRACH / AXILLARY );  Surgeon: Katha Cabal, MD;  Location: [facility];  Service: Vascular;  Laterality: Right;  . CARDIAC CATHETERIZATION    . CATARACT EXTRACTION Bilateral   . CHOLECYSTECTOMY N/A 12/09/2014   Procedure: LAPAROSCOPIC CHOLECYSTECTOMY;  Surgeon: Marlyce [name], MD;  Location: [facility] OR[facility];  Service: General;  Laterality: N/A;  . CORONARY ARTERY BYPASS GRAFT N/A 07/02/2013   Procedure: CORONARY ARTERY BYPASS GRAFTING (CABG);  Surgeon: Ivin [name], MD;  Location: [place];  Service: Open Heart Surgery;  Laterality: N/A;  CABG x three, using left internal mammary artery and right leg greater saphenous vein harvested endoscopically  . ESOPHAGOGASTRODUODENOSCOPY (EGD) WITH PROPOFOL N/A 11/24/2015   Procedure: ESOPHAGOGASTRODUODENOSCOPY (EGD) WITH PROPOFOL;  Surgeon: [name], MD;  Location: [place];  Service: Endoscopy;  Laterality: N/A;  Diabetic - insulin  . EYE SURGERY Bilateral    Cataract Extraction with IOL  . INTRAOPERATIVE TRANSESOPHAGEAL ECHOCARDIOGRAM N/A 07/02/2013   Procedure: INTRAOPERATIVE TRANSESOPHAGEAL ECHOCARDIOGRAM;  Surgeon: Tharon Aquas  Kerby Less, MD;  Location: [place];  Service: Open Heart Surgery;  Laterality: N/A;  . PERIPHERAL VASCULAR CATHETERIZATION Right 12/06/2015   Procedure: Dialysis/Perma Catheter Insertion;  Surgeon: Katha Cabal, MD;  Location: [place] [facility];  Service: Cardiovascular;  Laterality: Right;  . PERIPHERAL VASCULAR THROMBECTOMY Right 09/28/2016    Procedure: Peripheral Vascular Thrombectomy;  Surgeon: Katha Cabal, MD;  Location: [place] [facility];  Service: Cardiovascular;  Laterality: Right;  . PERIPHERAL VASCULAR THROMBECTOMY Right 05/20/2018   Procedure: PERIPHERAL VASCULAR THROMBECTOMY;  Surgeon: Katha Cabal, MD;  Location: [place] [facility];  Service: Cardiovascular;  Laterality: Right;  . PORTA CATH REMOVAL N/A 06/01/2016   Procedure: Glori [name] Cath Removal;  Surgeon: Katha Cabal, MD;  Location: [place] CV LAB;  Service: Cardiovascular;  Laterality: N/A;  . THORACENTESIS Left 2015   HPI:  Pt is a 63 y.o. female with medical history significant of ESRD on HD MWF, anemia, chronic diastolic congestive heart failure, CAD status post PCI, type 2 diabetes, hypertension, hyperlipidemia, hypothyroidism, and conditions listed below presenting via EMS after she was found to be somewhat less responsive since last night and this morning.  According to husband patient was having stomach upset all day yesterday.  And patient has had excessive sweating overnight.  Patient was this morning noticed to be less responsive per her husband and therefore he called EMS to send her to the hospital.  And has been weak on lower extremity since her last discharge from in July from Covid pneumonia.  Has been patient did not have any shortness of breath, cough, seizure-like activity.Subjective fever as patient was sweating overnight.  EMS was called and who brought the patient's here.   Pt was admitted in in June for COVID-19 pneumonia and had a prolonged hospitalization before discharge in late July.  Patient was receiving home health care.  However as per record, patient appears to have failed to follow-up with her PCP.  MRI results: "No acute intracranial abnormality".  Pt remains hospitalized w/ extended illness; no signficant improvement per MD notes.    Assessment / Plan / Recommendation Clinical Impression  Pt was seen as re-evaluation  per MD request. Pt now has a NG placed for enteral feedings for support d/t decline in status. She appears to present w/ oropharyngeal phase dysphagia impacted by her decline in Mentation/Cognition, declined medical status, and overall decreased Awareness/Alertness. These issues increase her risk for prandial aspiration. During attempts at po trials, pt remained mostly drowsy despite Mod-Max verbal/tactile cues given(only minimal phonations noted). Pt's eyes remained closed. Full positioning support given; pt did not engage to hold head upright. W/ po trials and stimulation of ice chips and nectar liquids placed at lips, pt exhibited decreased awareness of trials. Given further stim at lips, pt licked at lips intermittently, then exhibited a much delayed pharyngeal swallow during trials of Nectar consistency liquids. Overall decreased drowsiness/awareness appeared to severely limt pt's engagement w/ po/oral intake. With such presentation and decreased alertness, pt is at increased Risk for Aspiration w/ oral intake. No overt unilateral OM weakness noted. Oral phase impacted by drowsiness and poor alertness w/ tasks. Pt required full support.  Recommend continue a NPO status d/t risk for aspiration secondary to declined Cognitive status/alertness. Pt does have NG tube present for enteral feedings. Recommend frequent oral care for hygiene and stimulation of swallowing. Aspiration precautions. Pt's presentation and status and GOC were discussed w/ MD.  SLP Visit Diagnosis: Dysphagia, oropharyngeal phase (R13.12)  Aspiration Risk  Moderate aspiration risk;Risk for inadequate nutrition/hydration    Diet Recommendation  NPO w/ NG placed for enteral feedings; aspiration precautions; frequent oral care for hygiene and stimulation of swallowing; Reflux precautions d/t NG  Medication Administration: Via alternative means    Other  Recommendations Recommended Consults: (Palliative Care f/u; Dietician  following) Oral Care Recommendations: Oral care QID;Staff/trained caregiver to provide oral care(aspiration precautions)   Follow up Recommendations (TBD)      Frequency and Duration (n/a)  (n/a)       Prognosis Prognosis for Safe Diet Advancement: Guarded Barriers to Reach Goals: Cognitive deficits;Time post onset;Severity of deficits      Swallow Study   General Date of Onset: 02/09/19 HPI: Pt is a 63 y.o. female with medical history significant of ESRD on HD MWF, anemia, chronic diastolic congestive heart failure, CAD status post PCI, type 2 diabetes, hypertension, hyperlipidemia, hypothyroidism, and conditions listed below presenting via EMS after she was found to be somewhat less responsive since last night and this morning.  According to husband patient was having stomach upset all day yesterday.  And patient has had excessive sweating overnight.  Patient was this morning noticed to be less responsive per her husband and therefore he called EMS to send her to the hospital.  And has been weak on lower extremity since her last discharge from in July from Covid pneumonia.  Has been patient did not have any shortness of breath, cough, seizure-like activity.Subjective fever as patient was sweating overnight.  EMS was called and who brought the patient's here.   Pt was admitted in in June for COVID-19 pneumonia and had a prolonged hospitalization before discharge in late July.  Patient was receiving home health care.  However as per record, patient appears to have failed to follow-up with her PCP.  MRI results: "No acute intracranial abnormality".  Pt remains hospitalized w/ extended illness; no signficant improvement per MD notes.  Type of Study: Bedside Swallow Evaluation(repeat per MD request) Previous Swallow Assessment: 02/17/2019 Diet Prior to this Study: NPO;NG Tube Temperature Spikes Noted: No(wbc 6.6) Respiratory Status: Room air History of Recent Intubation: No Behavior/Cognition:  Lethargic/Drowsy;Requires cueing;Doesn't follow directions Oral Cavity Assessment: Dry(sticky) Oral Care Completed by SLP: Yes Oral Cavity - Dentition: Missing dentition Vision: (n/a) Self-Feeding Abilities: Total assist Patient Positioning: Upright in bed(needed full positioning support) Baseline Vocal Quality: (nonverbal; mumbled x2) Volitional Cough: Cognitively unable to elicit Volitional Swallow: Unable to elicit    Oral/Motor/Sensory Function Overall Oral Motor/Sensory Function: (unable to assess)   Ice Chips Ice chips: Impaired Presentation: Spoon(fed; 3 trials) Oral Phase Impairments: Poor awareness of bolus;Reduced labial seal;Reduced lingual movement/coordination Oral Phase Functional Implications: Oral holding Pharyngeal Phase Impairments: (no immediate pharyngeal swallow appreciated)   Thin Liquid Thin Liquid: Not tested    Nectar Thick Nectar Thick Liquid: Impaired Presentation: Spoon(2 trials) Oral Phase Impairments: Reduced labial seal;Reduced lingual movement/coordination;Poor awareness of bolus Oral phase functional implications: Oral holding;Oral residue Pharyngeal Phase Impairments: Suspected delayed Swallow(no immediate pharyngeal swallow appreciated; removed) Other Comments: noted a f/u pharyngeal swallow post ~1 min; min lip smacking/licking b/f swallowing -- overall, decreased awareness   Honey Thick Honey Thick Liquid: Not tested   Puree Puree: Not tested   Solid     Solid: Not tested       Orinda Kenner, MS, CCC-SLP Darran Gabay 02/28/2019,12:00 PM

## 2019-02-28 NOTE — Consult Note (Signed)
ANTICOAGULATION CONSULT NOTE  Pharmacy Consult for heparin Indication: DVT  Patient Measurements: Height: 5\' 4"  (162.6 cm) Weight: 161 lb 6 oz (73.2 kg) IBW/kg (Calculated) : 54.7 Heparin Dosing Weight: 71 kg  Vital Signs: Temp: 99.2 F (37.3 C) (12/26 0518) Temp Source: Oral (12/26 0518) BP: 116/55 (12/26 0518) Pulse Rate: 73 (12/26 0518)  Labs: Recent Labs    02/26/19 0231 02/26/19 0231 02/26/19 1413 02/27/19 0413 02/27/19 1356 02/27/19 2020 02/28/19 0626  HGB 9.4*   < > 8.9* 9.1*  --   --  8.6*  HCT 29.1*  --  27.2* 27.1*  --   --  25.3*  PLT 179  --  155 170  --   --  182  HEPARINUNFRC 0.26*  --   --  0.28* 0.47 0.36 0.16*  CREATININE 2.29*  2.25*  --   --  1.54*  --   --  2.46*   < > = values in this interval not displayed.   Estimated Creatinine Clearance: 22.4 mL/min (A) (by C-G formula based on SCr of 2.46 mg/dL (H)).  Medical History: Past Medical History:  Diagnosis Date  . Anemia   . Chronic diastolic CHF (congestive heart failure) ([place])    a. Due to ischemic cardiomyopathy. EF as low as 35%, improved to normal s/p CABG; b. echo 07/06/13: EF 55-60%, no RWMAs, mod TR, trivial pericardial effusion not c/w tamponade physiology;  c. 10/2015 Echo: EF 65%, Gr1 DD, triv AI, mild MR, mildly dil LA, mod TR, PASP 72mmHg.  Marland Kitchen Coronary artery disease    a. NSTEMI 06/2013; b.cath: severe three-vessel CAD w/ EF 30% & mild-mod MR; c. s/p 3 vessel CABG 07/02/13 (LIMA-LAD, SVG-OM, and SVG-RPDA);  d. 10/2015 MV: no ischemia/infarct.  . Diabetes mellitus without complication ([place])   . Diabetic neuropathy ([place])   . Dialysis patient [facility])    MWF  . ESRD (end stage renal disease) ([place])    a. 12/2015 initiated - mwf dialysis.  Marland Kitchen GERD (gastroesophageal reflux disease)   . Hyperlipidemia   . Hypertension   . Hypothyroidism   . Myocardial infarction ([place]) 2015  . Neuropathy   . Pleural effusion 2015  . Pulmonary hypertension ([place])   . Renal insufficiency   . Wears dentures     full lower    Medications:  Scheduled:  . aspirin  81 mg Oral Daily  . B-complex with vitamin C  1 tablet Per Tube Daily  . chlorhexidine  15 mL Mouth Rinse BID  . Chlorhexidine Gluconate Cloth  6 each Topical Q0600  . epoetin (EPOGEN/PROCRIT) injection  4,000 Units Intravenous Q M,W,F-HD  . feeding supplement (PRO-STAT SUGAR FREE 64)  30 mL Per Tube Daily  . fluticasone  2 spray Each Nare Daily  . free water  30 mL Per Tube Q4H  . insulin aspart  0-9 Units Subcutaneous TID WC  . levothyroxine  88 mcg Oral QAC breakfast  . mouth rinse  15 mL Mouth Rinse q12n4p  . midodrine  10 mg Oral TID WC  . pantoprazole (PROTONIX) IV  40 mg Intravenous Q24H  . venlafaxine  75 mg Oral BID WC  . zinc sulfate  220 mg Oral Daily    Assessment: 63 y.o. female admitted on 02/09/2019 with MRSA  bacteremia. She was on apixaban PTA but last dose was prior to admission (12/7). [place] 12/15 revealed age-indeterminate short segment occlusive DVT involving the mid humeral aspect of one of the paired brachial veins.  Heparin was started but  after repeated attempts by multiple phlebotomists lab was unable to draw labs required for heparin monitoring. Additionally, she was too confused and disoriented to swallow oral medications. On 12/20 a femoral central line was placed. Heparin was stopped this morning due to a fall and is being restarted upon returning from HD  Heparin Course: 12/24 0231 HL 0.26: held due to unwitnessed fall and bleeding from arm 12/25 0413 HL 0.28: subtherapeutic - CBC stable.  Will increase Heparin to 900 units/hr and recheck in 8 hours. 12/25 1356 HL 0.47: therapeutic  12/25 2020 HL 0.36: therapeutic x 2  Goal of Therapy:  Heparin Level: 0.3 - 0.7 units/mL Monitor platelets by anticoagulation protocol: Yes   Plan:  12/26 HL @0626 = 0.16  Heparin level subtherapeutic  Will give Heparin bolus of 2100 units and increase heparin drip to 1100 units/hr  Recheck HL in 8 hours  CBC in  am  Noralee Space, PharmD Clinical Pharmacist 02/28/2019 7:16 AM

## 2019-02-28 NOTE — Consult Note (Signed)
ANTICOAGULATION CONSULT NOTE  Pharmacy Consult for heparin Indication: DVT  Patient Measurements: Height: 5\' 4"  (162.6 cm) Weight: 161 lb 6 oz (73.2 kg) IBW/kg (Calculated) : 54.7 Heparin Dosing Weight: 71 kg  Vital Signs: Temp: 98 F (36.7 C) (12/26 1116) Temp Source: Oral (12/26 1116) BP: 124/51 (12/26 1116) Pulse Rate: 68 (12/26 1116)  Labs: Recent Labs    02/26/19 0231 02/26/19 0231 02/26/19 1413 02/27/19 0413 02/27/19 2020 02/28/19 0626 02/28/19 1313  HGB 9.4*   < > 8.9* 9.1*  --  8.6*  --   HCT 29.1*  --  27.2* 27.1*  --  25.3*  --   PLT 179  --  155 170  --  182  --   HEPARINUNFRC 0.26*  --   --  0.28* 0.36 0.16* 0.54  CREATININE 2.29*  2.25*  --   --  1.54*  --  2.46*  --    < > = values in this interval not displayed.   Estimated Creatinine Clearance: 22.4 mL/min (A) (by C-G formula based on SCr of 2.46 mg/dL (H)).  Medical History: Past Medical History:  Diagnosis Date  . Anemia   . Chronic diastolic CHF (congestive heart failure) ([place])    a. Due to ischemic cardiomyopathy. EF as low as 35%, improved to normal s/p CABG; b. echo 07/06/13: EF 55-60%, no RWMAs, mod TR, trivial pericardial effusion not c/w tamponade physiology;  c. 10/2015 Echo: EF 65%, Gr1 DD, triv AI, mild MR, mildly dil LA, mod TR, PASP 45mmHg.  Marland Kitchen Coronary artery disease    a. NSTEMI 06/2013; b.cath: severe three-vessel CAD w/ EF 30% & mild-mod MR; c. s/p 3 vessel CABG 07/02/13 (LIMA-LAD, SVG-OM, and SVG-RPDA);  d. 10/2015 MV: no ischemia/infarct.  . Diabetes mellitus without complication ([place])   . Diabetic neuropathy ([place])   . Dialysis patient [facility])    MWF  . ESRD (end stage renal disease) ([place])    a. 12/2015 initiated - mwf dialysis.  Marland Kitchen GERD (gastroesophageal reflux disease)   . Hyperlipidemia   . Hypertension   . Hypothyroidism   . Myocardial infarction ([place]) 2015  . Neuropathy   . Pleural effusion 2015  . Pulmonary hypertension ([place])   . Renal insufficiency   . Wears dentures     full lower    Medications:  Scheduled:  . aspirin  81 mg Oral Daily  . B-complex with vitamin C  1 tablet Per Tube Daily  . chlorhexidine  15 mL Mouth Rinse BID  . Chlorhexidine Gluconate Cloth  6 each Topical Q0600  . epoetin (EPOGEN/PROCRIT) injection  4,000 Units Intravenous Q M,W,F-HD  . feeding supplement (PRO-STAT SUGAR FREE 64)  30 mL Per Tube Daily  . fluticasone  2 spray Each Nare Daily  . free water  30 mL Per Tube Q4H  . insulin aspart  0-15 Units Subcutaneous TID WC  . insulin glargine  10 Units Subcutaneous Daily  . levothyroxine  88 mcg Oral QAC breakfast  . mouth rinse  15 mL Mouth Rinse q12n4p  . midodrine  10 mg Oral TID WC  . pantoprazole (PROTONIX) IV  40 mg Intravenous Q24H  . venlafaxine  75 mg Oral BID WC  . zinc sulfate  220 mg Oral Daily    Assessment: 63 y.o. female admitted on 02/09/2019 with MRSA  bacteremia. She was on apixaban PTA but last dose was prior to admission (12/7). [place] 12/15 revealed age-indeterminate short segment occlusive DVT involving the mid humeral aspect of one of  the paired brachial veins.  Heparin was started but after repeated attempts by multiple phlebotomists lab was unable to draw labs required for heparin monitoring. Additionally, she was too confused and disoriented to swallow oral medications. On 12/20 a femoral central line was placed. Heparin was stopped this morning due to a fall and is being restarted upon returning from HD  Heparin Course: 12/24 0231 HL 0.26: held due to unwitnessed fall and bleeding from arm 12/25 0413 HL 0.28: subtherapeutic - CBC stable.  Will increase Heparin to 900 units/hr and recheck in 8 hours. 12/25 1356 HL 0.47: therapeutic  12/25 2020 HL 0.36: therapeutic x 2 12/26 HL @0626 = 0.16. Will give Heparin bolus of 2100 units and increase heparin drip to 1100 units/hr   Goal of Therapy:  Heparin Level: 0.3 - 0.7 units/mL Monitor platelets by anticoagulation protocol: Yes   Plan:  12/26 HL @1313 =  0.54  Heparin level therapeutic  Will continue current heparin drip at 1100 units/hr  Recheck HL in 8 hours  CBC in am  Noralee Space, PharmD Clinical Pharmacist 02/28/2019 2:59 PM

## 2019-02-28 NOTE — Consult Note (Signed)
ANTICOAGULATION CONSULT NOTE  Pharmacy Consult for heparin Indication: DVT  Patient Measurements: Height: 5\' 4"  (162.6 cm) Weight: 161 lb 6 oz (73.2 kg) IBW/kg (Calculated) : 54.7 Heparin Dosing Weight: 71 kg  Vital Signs: Temp: 98.9 F (37.2 C) (12/26 1917) Temp Source: Oral (12/26 1917) BP: 114/57 (12/26 1917) Pulse Rate: 68 (12/26 1917)  Labs: Recent Labs    02/26/19 0231 02/26/19 0231 02/26/19 1413 02/27/19 0413 02/28/19 0626 02/28/19 1313 02/28/19 2202  HGB 9.4*   < > 8.9* 9.1* 8.6*  --   --   HCT 29.1*  --  27.2* 27.1* 25.3*  --   --   PLT 179  --  155 170 182  --   --   HEPARINUNFRC 0.26*  --   --  0.28* 0.16* 0.54 0.51  CREATININE 2.29*  2.25*  --   --  1.54* 2.46*  --   --    < > = values in this interval not displayed.   Estimated Creatinine Clearance: 22.4 mL/min (A) (by C-G formula based on SCr of 2.46 mg/dL (H)).  Medical History: Past Medical History:  Diagnosis Date  . Anemia   . Chronic diastolic CHF (congestive heart failure) ([place])    a. Due to ischemic cardiomyopathy. EF as low as 35%, improved to normal s/p CABG; b. echo 07/06/13: EF 55-60%, no RWMAs, mod TR, trivial pericardial effusion not c/w tamponade physiology;  c. 10/2015 Echo: EF 65%, Gr1 DD, triv AI, mild MR, mildly dil LA, mod TR, PASP 53mmHg.  Marland Kitchen Coronary artery disease    a. NSTEMI 06/2013; b.cath: severe three-vessel CAD w/ EF 30% & mild-mod MR; c. s/p 3 vessel CABG 07/02/13 (LIMA-LAD, SVG-OM, and SVG-RPDA);  d. 10/2015 MV: no ischemia/infarct.  . Diabetes mellitus without complication ([place])   . Diabetic neuropathy ([place])   . Dialysis patient [facility])    MWF  . ESRD (end stage renal disease) ([place])    a. 12/2015 initiated - mwf dialysis.  Marland Kitchen GERD (gastroesophageal reflux disease)   . Hyperlipidemia   . Hypertension   . Hypothyroidism   . Myocardial infarction ([place]) 2015  . Neuropathy   . Pleural effusion 2015  . Pulmonary hypertension ([place])   . Renal insufficiency   . Wears dentures     full lower    Medications:  Scheduled:  . aspirin  81 mg Oral Daily  . B-complex with vitamin C  1 tablet Per Tube Daily  . chlorhexidine  15 mL Mouth Rinse BID  . Chlorhexidine Gluconate Cloth  6 each Topical Q0600  . epoetin (EPOGEN/PROCRIT) injection  4,000 Units Intravenous Q M,W,F-HD  . feeding supplement (PRO-STAT SUGAR FREE 64)  30 mL Per Tube Daily  . fluticasone  2 spray Each Nare Daily  . free water  30 mL Per Tube Q4H  . insulin aspart  0-15 Units Subcutaneous TID WC  . insulin glargine  10 Units Subcutaneous Daily  . levothyroxine  88 mcg Oral QAC breakfast  . mouth rinse  15 mL Mouth Rinse q12n4p  . midodrine  10 mg Oral TID WC  . pantoprazole (PROTONIX) IV  40 mg Intravenous Q24H  . venlafaxine  75 mg Oral BID WC  . zinc sulfate  220 mg Oral Daily    Assessment: 63 y.o. female admitted on 02/09/2019 with MRSA  bacteremia. She was on apixaban PTA but last dose was prior to admission (12/7). [place] 12/15 revealed age-indeterminate short segment occlusive DVT involving the mid humeral aspect of one of  the paired brachial veins.  Heparin was started but after repeated attempts by multiple phlebotomists lab was unable to draw labs required for heparin monitoring. Additionally, she was too confused and disoriented to swallow oral medications. On 12/20 a femoral central line was placed. Heparin was stopped this morning due to a fall and is being restarted upon returning from HD  Heparin Course: 12/24 0231 HL 0.26: held due to unwitnessed fall and bleeding from arm 12/25 0413 HL 0.28: subtherapeutic - CBC stable.  Will increase Heparin to 900 units/hr and recheck in 8 hours. 12/25 1356 HL 0.47: therapeutic  12/25 2020 HL 0.36: therapeutic x 2 12/26 HL @0626 = 0.16. Will give Heparin bolus of 2100 units and increase heparin drip to 1100 units/hr  Goal of Therapy:  Heparin Level: 0.3 - 0.7 units/mL Monitor platelets by anticoagulation protocol: Yes   Plan:  12/26 HL @1313 =  0.54 12/26 HL @2202 = 0.51, therapeutic x 2  Heparin level therapeutic  Will continue current heparin drip at 1100 units/hr  CBC & HL in am  Ena Dawley, PharmD Clinical Pharmacist 02/28/2019 11:52 PM

## 2019-02-28 NOTE — Progress Notes (Signed)
[facility]  ROUNDING NOTE   Subjective:   No real improvement in mental status.   Attempted to work with SLP this morning.   Objective:  Vital signs in last 24 hours:  Temp:  [97.6 F (36.4 C)-99.4 F (37.4 C)] 99.2 F (37.3 C) (12/26 0518) Pulse Rate:  [67-80] 73 (12/26 0518) Resp:  [14-17] 14 (12/26 0518) BP: (103-116)/(44-58) 116/55 (12/26 0518) SpO2:  [95 %-100 %] 95 % (12/26 0518)  Weight change:  Filed Weights   02/23/19 1333 02/25/19 0842 02/25/19 1200  Weight: 73.4 kg 74.1 kg 73.2 kg    Intake/Output: I/O last 3 completed shifts: In: 3475.6 [I.V.:444.1; NG/GT:1995.2; IV Piggyback:1036.4] Out: -    Intake/Output this shift:  No intake/output data recorded.  Physical Exam: General: NAD, laying in bed  Head: OGT   Eyes: Anicteric, PERRL  Neck: Supple, trachea midline  Lungs:  Clear to auscultation  Heart: Regular rate and rhythm  Abdomen:  Soft, nontender,   Extremities: + peripheral edema.  Neurologic: Lethargic,responds to verbal stimuli.   Skin: No lesions  Access: Left AVG, bleeding over access    Basic Metabolic Panel: Recent Labs  Lab 02/24/19 0241 02/25/19 0640 02/26/19 0231 02/27/19 0413 02/28/19 0626  NA 140 140 141  140 141 138  K 3.3* 3.5 3.3*  3.4* 3.0* 3.4*  CL 104 105 100  100 98 97*  CO2 25 21* 26  22 31 29   GLUCOSE 188* 194* 198*  198* 219* 365*  BUN 9 16 9  10  6* 15  CREATININE 2.45* 3.35* 2.29*  2.25* 1.54* 2.46*  CALCIUM 8.0* 8.1* 7.9*  7.8* 8.0* 8.0*  MG 2.0 2.1 1.7 1.9 1.8  PHOS 2.8 3.5 1.9* 1.0* 2.1*    Liver Function Tests: Recent Labs  Lab 02/26/19 0231 02/27/19 0413 02/28/19 0626  AST 26 22 22   ALT 20 18 19   ALKPHOS 59 59 63  BILITOT 1.9* 1.7* 1.0  PROT 4.9* 5.0* 4.9*  ALBUMIN 2.1*  2.1* 2.1* 2.0*   No results for input(s): LIPASE, AMYLASE in the last 168 hours. No results for input(s): AMMONIA in the last 168 hours.  CBC: Recent Labs  Lab 02/25/19 0845 02/26/19 0231  02/26/19 1413 02/27/19 0413 02/28/19 0626  WBC 5.7 6.7 5.8 6.8 6.6  HGB 9.0* 9.4* 8.9* 9.1* 8.6*  HCT 28.0*  27.6* 29.1* 27.2* 27.1* 25.3*  MCV 88.9 89.8 89.2 87.1 87.5  PLT 171 179 155 170 182    Cardiac Enzymes: Recent Labs  Lab 02/23/19 0539  CKTOTAL 29*    BNP: Invalid input(s): POCBNP  CBG: Recent Labs  Lab 02/27/19 0801 02/27/19 1137 02/27/19 1701 02/27/19 2100 02/28/19 0820  GLUCAP 235* 216* 194* 40* 5*    Microbiology: Results for orders placed or performed during the hospital encounter of 02/09/19  Culture, blood (routine x 2)     Status: Abnormal   Collection Time: 02/09/19  2:29 PM   Specimen: BLOOD LEFT ARM  Result Value Ref Range Status   Specimen Description   Final    BLOOD LEFT ARM Performed at [facility], [address] [place], [place] [address]    Special Requests   Final    BOTTLES DRAWN AEROBIC AND ANAEROBIC Blood Culture adequate volume Performed at [facility], [address], [place], [place] [address]    Culture  Setup Time   Final    GRAM POSITIVE COCCI IN BOTH AEROBIC AND ANAEROBIC BOTTLES CRITICAL VALUE NOTED.  VALUE IS CONSISTENT  WITH PREVIOUSLY REPORTED AND CALLED VALUE. Performed at [facility], [address] [place], [place] [address]    Culture (A)  Final    STAPHYLOCOCCUS AUREUS SUSCEPTIBILITIES PERFORMED ON PREVIOUS CULTURE WITHIN THE LAST 5 DAYS. Performed at [facility], [place] [address], [place], [place] [address]    Report Status 02/12/2019 FINAL  Final  Culture, blood (routine x 2)     Status: Abnormal   Collection Time: 02/09/19  2:38 PM   Specimen: BLOOD LEFT ARM  Result Value Ref Range Status   Specimen Description   Final    BLOOD LEFT ARM Performed at [facility], [address], [place], [place] [address]    Special Requests   Final    BOTTLES DRAWN AEROBIC AND ANAEROBIC Blood Culture adequate volume Performed at [facility], [address], [place], [place] [address]    Culture  Setup Time   Final    GRAM POSITIVE COCCI IN BOTH AEROBIC AND ANAEROBIC BOTTLES CRITICAL RESULT CALLED TO, READ BACK BY AND VERIFIED WITH: [place] [id number] 02/10/2019 HNM Performed at [facility], [place] [address] [place] [address], [place],  [address]    Culture METHICILLIN RESISTANT STAPHYLOCOCCUS AUREUS (A)  Final   Report Status 02/12/2019 FINAL  Final   Organism ID, Bacteria METHICILLIN RESISTANT STAPHYLOCOCCUS AUREUS  Final      Susceptibility   Methicillin resistant staphylococcus aureus - MIC*    CIPROFLOXACIN >=8 RESISTANT Resistant     ERYTHROMYCIN >=8 RESISTANT Resistant     GENTAMICIN <=0.5 SENSITIVE Sensitive     OXACILLIN >=4 RESISTANT Resistant     TETRACYCLINE <=1 SENSITIVE Sensitive     VANCOMYCIN 1 SENSITIVE Sensitive     TRIMETH/SULFA <=10 SENSITIVE Sensitive     CLINDAMYCIN RESISTANT Resistant     RIFAMPIN <=0.5 SENSITIVE Sensitive     Inducible Clindamycin POSITIVE Resistant     * METHICILLIN RESISTANT STAPHYLOCOCCUS AUREUS  Blood Culture ID Panel (Reflexed)     Status: Abnormal   Collection Time: 02/09/19  2:38 PM  Result Value Ref Range Status   Enterococcus species NOT DETECTED NOT DETECTED Final   Listeria monocytogenes NOT DETECTED NOT DETECTED Final   Staphylococcus species DETECTED (A) NOT DETECTED Final    Comment: CRITICAL RESULT CALLED TO, READ BACK BY AND VERIFIED WITH: Hart Robinsons PHARMD [id number] 02/10/2019 HNM    Staphylococcus aureus (BCID) DETECTED (A) NOT DETECTED Final    Comment: Methicillin (oxacillin)-resistant Staphylococcus aureus (MRSA). MRSA is predictably resistant to beta-lactam antibiotics (except ceftaroline). Preferred therapy is vancomycin unless clinically contraindicated. Patient requires contact precautions if  hospitalized. CRITICAL RESULT CALLED TO, READ BACK BY AND VERIFIED WITH: Hart Robinsons PHARMD [id number] 02/10/2019 HNM    Methicillin resistance DETECTED (A) NOT DETECTED Final    Comment:  CRITICAL RESULT CALLED TO, READ BACK BY AND VERIFIED WITH: Hart Robinsons PHARMD [id number] 02/10/2019 HNM    Streptococcus species NOT DETECTED NOT DETECTED Final   Streptococcus agalactiae NOT DETECTED NOT DETECTED Final   Streptococcus pneumoniae NOT DETECTED NOT DETECTED Final   Streptococcus pyogenes NOT DETECTED NOT DETECTED Final   Acinetobacter baumannii NOT DETECTED NOT DETECTED Final   Enterobacteriaceae species NOT DETECTED NOT DETECTED Final   Enterobacter cloacae complex NOT DETECTED NOT DETECTED Final   Escherichia coli NOT DETECTED NOT DETECTED Final   Klebsiella oxytoca NOT DETECTED NOT DETECTED Final   Klebsiella pneumoniae NOT DETECTED NOT DETECTED Final   Proteus species NOT DETECTED NOT DETECTED Final  Serratia marcescens NOT DETECTED NOT DETECTED Final   Haemophilus influenzae NOT DETECTED NOT DETECTED Final   Neisseria meningitidis NOT DETECTED NOT DETECTED Final   Pseudomonas aeruginosa NOT DETECTED NOT DETECTED Final   Candida albicans NOT DETECTED NOT DETECTED Final   Candida glabrata NOT DETECTED NOT DETECTED Final   Candida krusei NOT DETECTED NOT DETECTED Final   Candida parapsilosis NOT DETECTED NOT DETECTED Final   Candida tropicalis NOT DETECTED NOT DETECTED Final    Comment: Performed at [facility], [place], [place] [address]  SARS CORONAVIRUS 2 (TAT 6-24 HRS) Nasopharyngeal Nasopharyngeal Swab     Status: None   Collection Time: 02/09/19  2:54 PM   Specimen: Nasopharyngeal Swab  Result Value Ref Range Status   SARS Coronavirus 2 NEGATIVE NEGATIVE Final    Comment: (NOTE) SARS-CoV-2 target nucleic acids are NOT DETECTED. The SARS-CoV-2 RNA is generally detectable in upper and lower respiratory specimens during the acute phase of infection. Negative results do not preclude SARS-CoV-2 infection, do not rule out co-infections with other pathogens, and should not be used as the sole basis for treatment or other patient management  decisions. Negative results must be combined with clinical observations, patient history, and epidemiological information. The expected result is Negative. Fact Sheet for Patients: SugarRoll.be Fact Sheet for Healthcare Providers: https://www.woods-mathews.com/ This test is not yet approved or cleared by the [place] FDA and  has been authorized for detection and/or diagnosis of SARS-CoV-2 by FDA under an Emergency Use Authorization (EUA). This EUA will remain  in effect (meaning this test can be used) for the duration of the COVID-19 declaration under Section 56 4(b)(1) of the Act, 21 U.S.C. section 360bbb-3(b)(1), unless the authorization is terminated or revoked sooner. Performed at [facility], [place] [address] [place], [place] [address]   MRSA PCR Screening     Status: Abnormal   Collection Time: 02/10/19  5:03 PM   Specimen: Nasopharyngeal  Result Value Ref Range Status   MRSA by PCR POSITIVE (A) NEGATIVE Final    Comment:        The GeneXpert MRSA Assay (FDA approved for NASAL specimens only), is one component of a comprehensive MRSA colonization surveillance program. It is not intended to diagnose MRSA infection nor to guide or monitor treatment for MRSA infections. RESULT CALLED TO, READ BACK BY AND VERIFIED WITH: ALEKSEY FRASER @1839  02/10/19 MJU Performed at [facility], [address] [place], [place] [address]   Culture, blood (routine x 2)     Status: Abnormal   Collection Time: 02/11/19 12:31 PM   Specimen: BLOOD  Result Value Ref Range Status   Specimen Description   Final    BLOOD PORTA CATH Performed at [place] [address], [place], [place] [address]    Special Requests   Final    BOTTLES DRAWN AEROBIC AND ANAEROBIC Blood Culture adequate volume Performed at [facility], [address] [place], [place] [address]    Culture  Setup Time   Final    GRAM POSITIVE  COCCI ANAEROBIC BOTTLE ONLY CRITICAL RESULT CALLED TO, READ BACK BY AND VERIFIED WITH: SCOTT HALL AT [id number] 02/12/2019 [facility] [place]  Performed at [facility], [address] [place], [place] [address]    Culture (A)  Final    STAPHYLOCOCCUS AUREUS SUSCEPTIBILITIES PERFORMED ON PREVIOUS CULTURE WITHIN THE LAST 5 DAYS. Performed at [facility], [place] [address] [place], [place] [address]  Report Status 02/13/2019 FINAL  Final  Culture, blood (routine x 2)     Status: None   Collection Time: 02/11/19  3:57 PM   Specimen: BLOOD  Result Value Ref Range Status   Specimen Description BLOOD PORTA CATH  Final   Special Requests   Final    BOTTLES DRAWN AEROBIC AND ANAEROBIC Blood Culture adequate volume   Culture   Final    NO GROWTH 5 DAYS Performed at [facility], [address] [place], [place] [address]    Report Status 02/16/2019 FINAL  Final  CULTURE, BLOOD (ROUTINE X 2) w Reflex to ID Panel     Status: Abnormal   Collection Time: 02/13/19  2:18 PM   Specimen: BLOOD LEFT HAND  Result Value Ref Range Status   Specimen Description BLOOD LEFT HAND  Final   Special Requests   Final    BOTTLES DRAWN AEROBIC AND ANAEROBIC Blood Culture results may not be optimal due to an inadequate volume of blood received in culture bottles   Culture  Setup Time   Final    AEROBIC BOTTLE ONLY GRAM POSITIVE COCCI IN CLUSTERS CRITICAL RESULT CALLED TO, READ BACK BY AND VERIFIED WITH: Herbert Pun AT [id number] ON 02/14/2019 [place]. Performed at [facility], [place] [address] [place], [place] [address]    Culture METHICILLIN RESISTANT STAPHYLOCOCCUS AUREUS (A)  Final   Report Status 02/16/2019 FINAL  Final   Organism ID, Bacteria METHICILLIN RESISTANT STAPHYLOCOCCUS AUREUS  Final      Susceptibility   Methicillin resistant staphylococcus aureus - MIC*    CIPROFLOXACIN >=8 RESISTANT Resistant     ERYTHROMYCIN >=8 RESISTANT Resistant     GENTAMICIN <=0.5 SENSITIVE Sensitive      OXACILLIN >=4 RESISTANT Resistant     TETRACYCLINE <=1 SENSITIVE Sensitive     VANCOMYCIN <=0.5 SENSITIVE Sensitive     TRIMETH/SULFA <=10 SENSITIVE Sensitive     CLINDAMYCIN RESISTANT Resistant     RIFAMPIN <=0.5 SENSITIVE Sensitive     Inducible Clindamycin POSITIVE Resistant     * METHICILLIN RESISTANT STAPHYLOCOCCUS AUREUS  Blood Culture ID Panel (Reflexed)     Status: Abnormal   Collection Time: 02/13/19  2:18 PM  Result Value Ref Range Status   Enterococcus species NOT DETECTED NOT DETECTED Final   Listeria monocytogenes NOT DETECTED NOT DETECTED Final   Staphylococcus species DETECTED (A) NOT DETECTED Final    Comment: CRITICAL RESULT CALLED TO, READ BACK BY AND VERIFIED WITH: ABBEY ELLINGTON AT [id number] ON 02/14/2019 [place].    Staphylococcus aureus (BCID) DETECTED (A) NOT DETECTED Final    Comment: Methicillin (oxacillin)-resistant Staphylococcus aureus (MRSA). MRSA is predictably resistant to beta-lactam antibiotics (except ceftaroline). Preferred therapy is vancomycin unless clinically contraindicated. Patient requires contact precautions if  hospitalized. CRITICAL RESULT CALLED TO, READ BACK BY AND VERIFIED WITH: Herbert Pun AT [id number] ON 02/14/2019 [place].    Methicillin resistance DETECTED (A) NOT DETECTED Final    Comment: CRITICAL RESULT CALLED TO, READ BACK BY AND VERIFIED WITH: Herbert Pun AT [id number] ON 02/14/2019 [place].    Streptococcus species NOT DETECTED NOT DETECTED Final   Streptococcus agalactiae NOT DETECTED NOT DETECTED Final   Streptococcus pneumoniae NOT DETECTED NOT DETECTED Final   Streptococcus pyogenes NOT DETECTED NOT DETECTED Final   Acinetobacter baumannii NOT DETECTED NOT DETECTED Final   Enterobacteriaceae species NOT DETECTED NOT DETECTED Final   Enterobacter cloacae complex NOT DETECTED NOT DETECTED Final   Escherichia coli NOT DETECTED NOT DETECTED Final  Klebsiella oxytoca NOT DETECTED NOT DETECTED Final   Klebsiella pneumoniae NOT DETECTED NOT  DETECTED Final   Proteus species NOT DETECTED NOT DETECTED Final   Serratia marcescens NOT DETECTED NOT DETECTED Final   Haemophilus influenzae NOT DETECTED NOT DETECTED Final   Neisseria meningitidis NOT DETECTED NOT DETECTED Final   Pseudomonas aeruginosa NOT DETECTED NOT DETECTED Final   Candida albicans NOT DETECTED NOT DETECTED Final   Candida glabrata NOT DETECTED NOT DETECTED Final   Candida krusei NOT DETECTED NOT DETECTED Final   Candida parapsilosis NOT DETECTED NOT DETECTED Final   Candida tropicalis NOT DETECTED NOT DETECTED Final    Comment: Performed at [facility], [address] [place], [place] [address]  CULTURE, BLOOD (ROUTINE X 2) w Reflex to ID Panel     Status: None   Collection Time: 02/13/19  3:09 PM   Specimen: BLOOD  Result Value Ref Range Status   Specimen Description BLOOD BLOOD LEFT HAND  Final   Special Requests   Final    BOTTLES DRAWN AEROBIC AND ANAEROBIC Blood Culture adequate volume   Culture   Final    NO GROWTH 5 DAYS Performed at [facility], [address] [place], [place] [address]    Report Status 02/18/2019 FINAL  Final  Culture, blood (routine x 2)     Status: Abnormal   Collection Time: 02/15/19 12:49 PM   Specimen: BLOOD  Result Value Ref Range Status   Specimen Description   Final    BLOOD LT HAND Performed at [facility], [address] [place], [place] [address]    Special Requests   Final    BOTTLES DRAWN AEROBIC AND ANAEROBIC Blood Culture results may not be optimal due to an inadequate volume of blood received in culture bottles Performed at [facility], [address] [place], [place] [address]    Culture  Setup Time   Final    GRAM POSITIVE COCCI IN BOTH AEROBIC AND ANAEROBIC BOTTLES CRITICAL RESULT CALLED TO, READ BACK BY AND VERIFIED WITHEleonore Chiquito AT [id number] 02/16/2019 S[place] Performed at [facility], [address] [place], [place] [address]    Culture (A)   Final    METHICILLIN RESISTANT STAPHYLOCOCCUS AUREUS STAPHYLOCOCCUS SPECIES (COAGULASE NEGATIVE) THE SIGNIFICANCE OF ISOLATING THIS ORGANISM FROM A SINGLE SET OF BLOOD CULTURES WHEN MULTIPLE SETS ARE DRAWN IS UNCERTAIN. PLEASE NOTIFY THE MICROBIOLOGY DEPARTMENT WITHIN ONE WEEK IF SPECIATION AND SENSITIVITIES ARE REQUIRED. Performed at [facility], [place] [address], [place],  [address]    Report Status 02/18/2019 FINAL  Final   Organism ID, Bacteria METHICILLIN RESISTANT STAPHYLOCOCCUS AUREUS  Final      Susceptibility   Methicillin resistant staphylococcus aureus - MIC*    CIPROFLOXACIN >=8 RESISTANT Resistant     ERYTHROMYCIN >=8 RESISTANT Resistant     GENTAMICIN <=0.5 SENSITIVE Sensitive     OXACILLIN >=4 RESISTANT Resistant     TETRACYCLINE <=1 SENSITIVE Sensitive     VANCOMYCIN <=0.5 SENSITIVE Sensitive     TRIMETH/SULFA <=10 SENSITIVE Sensitive     CLINDAMYCIN RESISTANT Resistant     RIFAMPIN <=0.5 SENSITIVE Sensitive     Inducible Clindamycin POSITIVE Resistant     * METHICILLIN RESISTANT STAPHYLOCOCCUS AUREUS  Aerobic Culture (superficial specimen)     Status: None   Collection Time: 02/17/19  1:31 PM   Specimen: Heel; Wound  Result Value Ref Range Status   Specimen Description   Final    HEEL Performed  at [facility], [address], [place], [place] [address]    Special Requests   Final    NONE Performed at [facility], [address] [place], [place] [address]    Gram Stain   Final    NO WBC SEEN MODERATE GRAM POSITIVE COCCI IN PAIRS    Culture   Final    ABUNDANT METHICILLIN RESISTANT STAPHYLOCOCCUS AUREUS ABUNDANT DIPHTHEROIDS(CORYNEBACTERIUM SPECIES) Standardized susceptibility testing for this organism is not available. Performed at [facility], [place] [address], [place], [place] [address]    Report Status 02/19/2019 FINAL  Final   Organism ID, Bacteria METHICILLIN RESISTANT STAPHYLOCOCCUS AUREUS  Final       Susceptibility   Methicillin resistant staphylococcus aureus - MIC*    CIPROFLOXACIN >=8 RESISTANT Resistant     ERYTHROMYCIN >=8 RESISTANT Resistant     GENTAMICIN <=0.5 SENSITIVE Sensitive     OXACILLIN >=4 RESISTANT Resistant     TETRACYCLINE <=1 SENSITIVE Sensitive     VANCOMYCIN 1 SENSITIVE Sensitive     TRIMETH/SULFA <=10 SENSITIVE Sensitive     CLINDAMYCIN RESISTANT Resistant     RIFAMPIN <=0.5 SENSITIVE Sensitive     Inducible Clindamycin POSITIVE Resistant     * ABUNDANT METHICILLIN RESISTANT STAPHYLOCOCCUS AUREUS  CULTURE, BLOOD (ROUTINE X 2) w Reflex to ID Panel     Status: None   Collection Time: 02/18/19 11:29 PM   Specimen: BLOOD  Result Value Ref Range Status   Specimen Description BLOOD LEFT ARM  Final   Special Requests   Final    BOTTLES DRAWN AEROBIC AND ANAEROBIC Blood Culture adequate volume   Culture   Final    NO GROWTH 5 DAYS Performed at [facility], [address] [place], [place] [address]    Report Status 02/23/2019 FINAL  Final  CULTURE, BLOOD (ROUTINE X 2) w Reflex to ID Panel     Status: None   Collection Time: 02/18/19 11:29 PM   Specimen: BLOOD  Result Value Ref Range Status   Specimen Description BLOOD LEFT ARM  Final   Special Requests   Final    BOTTLES DRAWN AEROBIC AND ANAEROBIC Blood Culture adequate volume   Culture   Final    NO GROWTH 5 DAYS Performed at [facility], [address], [place], [place] [address]    Report Status 02/23/2019 FINAL  Final    Coagulation Studies: No results for input(s): LABPROT, INR in the last 72 hours.  Urinalysis: No results for input(s): COLORURINE, LABSPEC, PHURINE, GLUCOSEU, HGBUR, BILIRUBINUR, KETONESUR, PROTEINUR, UROBILINOGEN, NITRITE, LEUKOCYTESUR in the last 72 hours.  Invalid input(s): APPERANCEUR    Imaging: No results found.   Medications:   . sodium chloride Stopped (02/27/19 1433)  . sodium chloride    . DAPTOmycin (CUBICIN)  IV Stopped (02/26/19 2232)   . feeding supplement (OSMOLITE 1.5 CAL) 1,000 mL (02/28/19 1032)  . heparin 1,100 Units/hr (02/28/19 0742)  . magnesium sulfate bolus IVPB    . potassium chloride 10 mEq (02/28/19 0847)  . potassium PHOSPHATE IVPB (in mmol) 20 mmol (02/28/19 1039)  . vancomycin     . aspirin  81 mg Oral Daily  . B-complex with vitamin C  1 tablet Per Tube Daily  . chlorhexidine  15 mL Mouth Rinse BID  . Chlorhexidine Gluconate Cloth  6 each Topical Q0600  . epoetin (EPOGEN/PROCRIT) injection  4,000 Units Intravenous Q M,W,F-HD  . feeding supplement (PRO-STAT SUGAR FREE 64)  30 mL Per Tube  Daily  . fluticasone  2 spray Each Nare Daily  . free water  30 mL Per Tube Q4H  . insulin aspart  0-15 Units Subcutaneous TID WC  . insulin glargine  10 Units Subcutaneous Daily  . levothyroxine  88 mcg Oral QAC breakfast  . mouth rinse  15 mL Mouth Rinse q12n4p  . midodrine  10 mg Oral TID WC  . pantoprazole (PROTONIX) IV  40 mg Intravenous Q24H  . venlafaxine  75 mg Oral BID WC  . zinc sulfate  220 mg Oral Daily   sodium chloride, sodium chloride, haloperidol lactate, ondansetron (ZOFRAN) IV  Assessment/ Plan:  Jamie Arias is a 63 y.o. white female with end-stage renal disease on hemodialysis, atrial fibrillation, chronic diastolic congestive heart failure, coronary artery disease status post CABG, diabetes mellitus type II, hypertension, hyperlipidemia, hypothyroidism, Covid positive in July 2020, recent nursing home stay in [place] Prolonged hospitalization for MRSA bacteremia/sepsis and now with acute encephalopathy  [facility] MWF [facility] [address] Left AVG 81.5kg  1. End Stage Renal Disease: on hemodialysis. With complication of dialysis device. Angiogram on 12/21 shows no communication with bleed to AVG.  Placed tunneled catheter until her AVG site has healed. -  MWF schedule .  Next dialysis for Monday  2. Hypotension - midodrine  3. Anemia of chronic kidney disease:   Status post PRBC  transfusion on 12/22 - EPO with HD treatments.   4. Secondary Hyperparathyroidism: not currently on binders.   5. Acute Encephalopathy: improving off ceftaroline.  - Appreciate neurology input.   Overall prognosis remains poor. Appreciate palliative care input. Unclear if patient will recover at this point.    LOS: [place] 12/26/202011:05 AM

## 2019-02-28 NOTE — Progress Notes (Signed)
PROGRESS NOTE    KIMLY STEIERT  [id number] DOB: Apr 07, 1955 DOA: 02/09/2019 PCP: [name], MD   Brief Narrative:  63 year old with history of ESRD on HD, hypothyroidism, DM 2, admitted to the hospital for severe septic shock,  encephalopathic found to have MRSA bacteremia.  Started on IV vancomycin, nephrology and infectious disease were consulted.  Echocardiogram was negative therefore TEE planned per cardiology.  Due to difficult access femoral line was placed, Teflaro stopped.  Issues with hemodialysis access therefore tunneled catheter placed until the graft site is healed.  Sustained a fall on the night of 02/25/2019 causing bleeding from her right arm with spontaneously stopped with basic management.   Assessment & Plan:   Principal Problem:   Sepsis due to methicillin resistant Staphylococcus aureus (MRSA) ([place]) Active Problems:   Diabetes mellitus, type 2 (HCC)   Hypotension   Chronic diastolic CHF (congestive heart failure) (HCC)   ESRD (end stage renal disease) (HCC)   Altered level of consciousness   Pressure injury of skin   MRSA bacteremia   Arm DVT (deep venous thromboembolism), acute, left (HCC)   Acute encephalopathy   Septic shock secondary to MRSA bacteremia -Shock physiology resolved, sepsis improving.  Echo-negative.  CT spine-negative -TEE-pending, awaiting mental status improvement. -Antibiotic-vancomycin.  ID following.  Acute blood loss from the right arm, resolved Mechanical fall -Fall causing abrasion in the right arm causing bleeding.  Currently subsided with dressing in place. -CT head negative  ESRD on hemodialysis -Issues with AV graft therefore tunneled catheter placed by vascular  Acute metabolic encephalopathy, intermittent -Likely sepsis related.  MRI brain-negative.  EEG-negative -Ammonia-normal.  Seen by neurology, does not think LP would give any diagnostic value at this point. -TSH-noted -Blood glucose-normal. -Question  that her mentation could be improving after discontinuing ceftaroline -May need to reconsult neurology  Acute left arm DVT -Continue heparin drip  History of chronic atrial fibrillation -On amiodarone and Eliquis which are currently on hold until p.o. status improves. In the meantime on Heparin drip.   Chronic diastolic congestive heart failure -Continue to monitor volume status  Diabetes mellitus type 2, uncontrolled due to hyperglycemia -Insulin sliding scale and Accu-Chek -Lantus 10 units daily added.  Add sliding scale.  Hypothyroidism -Synthroid 88 mcg daily  GERD -PPI  Anemia of chronic disease -Intermittently received transfusion secondary to blood loss anemia, iatrogenic  Goals of care discussion -Palliative care following.  Currently patient is DNR.  Overall patient has poor quality of life -NG tube in place.  Currently getting a nutrition via NG tube feeding.  Not alert enough to safely self feed herself I anticipate some refeeding syndrome therefore we will aggressively monitor and replete electrolytes.  DVT prophylaxis: Heparin drip Code Status: DNR Family Communication: None none at bedside Disposition Plan: Maintain hospital stay pending improvement in mentation  Consultants:   ID  Nephrology  Cardiology  Neurology  Procedures:   Right femoral CVC 12/8-removed  Right femoral arterial line 12/8-removed  Left chest permacath 12/21  Antimicrobials:   Vancomycin 12/7-12/14  Ceftaroline 12/14-12/21  Daptomycin 12/14   Subjective: No acute events overnight.  Patient remains somnolent again this morning but arousable.  Grossly moving all the extremities.  Does not carry on any meaningful conversation.  Review of Systems Otherwise negative except as per HPI, including: Difficult to obtain  Objective: Vitals:   02/27/19 1134 02/27/19 1445 02/27/19 2040 02/28/19 0518  BP: (!) 103/58  (!) 107/44 (!) 116/55  Pulse: 75 67 80 73  Resp: 17   16 14   Temp: 97.6 F (36.4 C)  99.4 F (37.4 C) 99.2 F (37.3 C)  TempSrc: Axillary  Oral Oral  SpO2: 99%  100% 95%  Weight:      Height:        Intake/Output Summary (Last 24 hours) at 02/28/2019 1006 Last data filed at 02/28/2019 0332 Gross per 24 hour  Intake 1506.11 ml  Output --  Net 1506.11 ml   Filed Weights   02/23/19 1333 02/25/19 0842 02/25/19 1200  Weight: 73.4 kg 74.1 kg 73.2 kg    Examination: Constitutional: Chronically ill and frail appearing.  NG tube in place. Respiratory: Slight rhonchi heard anteriorly. Cardiovascular: Normal sinus rhythm, no rubs Abdomen: Nontender nondistended good bowel sounds Musculoskeletal: No edema noted Skin: No rashes seen Neurologic: Difficult to assess full neurologic exam but grossly moving all the extremities Psychiatric: Alert to her name only.  Poor judgment and insight  External catheter in place  Data Reviewed:   CBC: Recent Labs  Lab 02/25/19 0845 02/26/19 0231 02/26/19 1413 02/27/19 0413 02/28/19 0626  WBC 5.7 6.7 5.8 6.8 6.6  HGB 9.0* 9.4* 8.9* 9.1* 8.6*  HCT 28.0*  27.6* 29.1* 27.2* 27.1* 25.3*  MCV 88.9 89.8 89.2 87.1 87.5  PLT 171 179 155 170 [id number]   Basic Metabolic Panel: Recent Labs  Lab 02/24/19 0241 02/25/19 0640 02/26/19 0231 02/27/19 0413 02/28/19 0626  NA 140 140 141  140 141 138  K 3.3* 3.5 3.3*  3.4* 3.0* 3.4*  CL 104 105 100  100 98 97*  CO2 25 21* 26  22 31 29   GLUCOSE 188* 194* 198*  198* 219* 365*  BUN 9 16 9  10  6* 15  CREATININE 2.45* 3.35* 2.29*  2.25* 1.54* 2.46*  CALCIUM 8.0* 8.1* 7.9*  7.8* 8.0* 8.0*  MG 2.0 2.1 1.7 1.9 1.8  PHOS 2.8 3.5 1.9* 1.0* 2.1*   GFR: Estimated Creatinine Clearance: 22.4 mL/min (A) (by C-G formula based on SCr of 2.46 mg/dL (H)). Liver Function Tests: Recent Labs  Lab 02/26/19 0231 02/27/19 0413 02/28/19 0626  AST 26 22 22   ALT 20 18 19   ALKPHOS 59 59 63  BILITOT 1.9* 1.7* 1.0  PROT 4.9* 5.0* 4.9*  ALBUMIN 2.1*  2.1* 2.1*  2.0*   No results for input(s): LIPASE, AMYLASE in the last 168 hours. No results for input(s): AMMONIA in the last 168 hours. Coagulation Profile: No results for input(s): INR, PROTIME in the last 168 hours. Cardiac Enzymes: Recent Labs  Lab 02/23/19 0539  CKTOTAL 29*   BNP (last 3 results) No results for input(s): PROBNP in the last 8760 hours. HbA1C: No results for input(s): HGBA1C in the last 72 hours. CBG: Recent Labs  Lab 02/27/19 0801 02/27/19 1137 02/27/19 1701 02/27/19 2100 02/28/19 0820  GLUCAP 235* 216* 194* 263* 366*   Lipid Profile: No results for input(s): CHOL, HDL, LDLCALC, TRIG, CHOLHDL, LDLDIRECT in the last 72 hours. Thyroid Function Tests: No results for input(s): TSH, T4TOTAL, FREET4, T3FREE, THYROIDAB in the last 72 hours. Anemia Panel: No results for input(s): VITAMINB12, FOLATE, FERRITIN, TIBC, IRON, RETICCTPCT in the last 72 hours. Sepsis Labs: No results for input(s): PROCALCITON, LATICACIDVEN in the last 168 hours.  Recent Results (from the past 240 hour(s))  CULTURE, BLOOD (ROUTINE X 2) w Reflex to ID Panel     Status: None   Collection Time: 02/18/19 11:29 PM   Specimen: BLOOD  Result Value Ref Range Status   Specimen  Description BLOOD LEFT ARM  Final   Special Requests   Final    BOTTLES DRAWN AEROBIC AND ANAEROBIC Blood Culture adequate volume   Culture   Final    NO GROWTH 5 DAYS Performed at [facility], [address] [place], [place] [address]    Report Status 02/23/2019 FINAL  Final  CULTURE, BLOOD (ROUTINE X 2) w Reflex to ID Panel     Status: None   Collection Time: 02/18/19 11:29 PM   Specimen: BLOOD  Result Value Ref Range Status   Specimen Description BLOOD LEFT ARM  Final   Special Requests   Final    BOTTLES DRAWN AEROBIC AND ANAEROBIC Blood Culture adequate volume   Culture   Final    NO GROWTH 5 DAYS Performed at [facility], [address] [place], [place] [address]    Report Status  02/23/2019 FINAL  Final         Radiology Studies: No results found.      Scheduled Meds: . aspirin  81 mg Oral Daily  . B-complex with vitamin C  1 tablet Per Tube Daily  . chlorhexidine  15 mL Mouth Rinse BID  . Chlorhexidine Gluconate Cloth  6 each Topical Q0600  . epoetin (EPOGEN/PROCRIT) injection  4,000 Units Intravenous Q M,W,F-HD  . feeding supplement (PRO-STAT SUGAR FREE 64)  30 mL Per Tube Daily  . fluticasone  2 spray Each Nare Daily  . free water  30 mL Per Tube Q4H  . insulin aspart  0-9 Units Subcutaneous TID WC  . levothyroxine  88 mcg Oral QAC breakfast  . mouth rinse  15 mL Mouth Rinse q12n4p  . midodrine  10 mg Oral TID WC  . pantoprazole (PROTONIX) IV  40 mg Intravenous Q24H  . venlafaxine  75 mg Oral BID WC  . zinc sulfate  220 mg Oral Daily   Continuous Infusions: . sodium chloride Stopped (02/27/19 1433)  . sodium chloride    . DAPTOmycin (CUBICIN)  IV Stopped (02/26/19 2232)  . feeding supplement (OSMOLITE 1.5 CAL) 1,000 mL (02/27/19 0943)  . heparin 1,100 Units/hr (02/28/19 0742)  . magnesium sulfate bolus IVPB    . potassium chloride 10 mEq (02/28/19 0847)  . potassium PHOSPHATE IVPB (in mmol)    . vancomycin       LOS: 19 days   Time spent=25 mins    Averi Cacioppo Arsenio Loader, MD [facility]  If 7PM-7AM, please contact night-coverage  02/28/2019, 10:06 AM

## 2019-02-28 NOTE — Consult Note (Signed)
Pharmacy Antibiotic Note  Jamie Arias is a 63 y.o. female admitted on 02/09/2019 with methicillin resistant Staphylococcus aureus  .  Pharmacy has been consulted for daptomycin dosing (previous antibiotics included vancomycin and ceftaroline) leukocytosis has resolved with no recent fevers. (per ID: Post covid morbidity- Long COVID)  12/21 CK 29 12/24 Discussed with Dr Delaine [name], Blood cultures have cleared and more stable from infectious standpoint, plan to change daptomycin to vancomycin.    Plan:  Continue daptomycin 650 mg IV every 48 hours (Hemodialysis pt)  Vancomycin 1500mg  IV x 1 today, then 750mg  IV qHD MWF  Pharmacy has been consulted to stop daptomyin once pre-HD vancomycin level therapeutic (goal 15-25 mcg/mL). Plan level before HD on Monday 12/28.  If therapeutic, stop dapto.  CK weekly while on daptomycin (next 12/28)  Height: 5\' 4"  (162.6 cm) Weight: 161 lb 6 oz (73.2 kg) IBW/kg (Calculated) : 54.7  Temp (24hrs), Avg:98.9 F (37.2 C), Min:98 F (36.7 C), Max:99.4 F (37.4 C)  Recent Labs  Lab 02/24/19 0241 02/25/19 0640 02/25/19 0845 02/26/19 0231 02/26/19 1413 02/27/19 0413 02/28/19 0626  WBC 5.7  --  5.7 6.7 5.8 6.8 6.6  CREATININE 2.45* 3.35*  --  2.29*  2.25*  --  1.54* 2.46*    Estimated Creatinine Clearance: 22.4 mL/min (A) (by C-G formula based on SCr of 2.46 mg/dL (H)).    Antimicrobials this admission: ceftaroline 12/14 >> 12/21 daptomycin 12/14 >>  Vancomycin 12/24  Microbiology results: 12/16 BCx: NG 12/15 WCx MRSA 12/13 BCx: MRSA   Thank you for allowing pharmacy to be a part of this patient's care.  Doreene Eland, PharmD, BCPS.   Work Cell: [phone] 02/28/2019 11:53 AM

## 2019-03-01 ENCOUNTER — Inpatient Hospital Stay: Payer: Medicare Other

## 2019-03-01 DIAGNOSIS — G9341 Metabolic encephalopathy: Secondary | ICD-10-CM

## 2019-03-01 DIAGNOSIS — D62 Acute posthemorrhagic anemia: Secondary | ICD-10-CM

## 2019-03-01 DIAGNOSIS — L89612 Pressure ulcer of right heel, stage 2: Secondary | ICD-10-CM

## 2019-03-01 LAB — COMPREHENSIVE METABOLIC PANEL
ALT: 17 U/L (ref 0–44)
AST: 22 U/L (ref 15–41)
Albumin: 1.8 g/dL — ABNORMAL LOW (ref 3.5–5.0)
Alkaline Phosphatase: 58 U/L (ref 38–126)
Anion gap: 13 (ref 5–15)
BUN: 21 mg/dL (ref 8–23)
CO2: 26 mmol/L (ref 22–32)
Calcium: 8.2 mg/dL — ABNORMAL LOW (ref 8.9–10.3)
Chloride: 96 mmol/L — ABNORMAL LOW (ref 98–111)
Creatinine, Ser: 3.03 mg/dL — ABNORMAL HIGH (ref 0.44–1.00)
GFR calc Af Amer: 18 mL/min — ABNORMAL LOW (ref >60)
GFR calc non Af Amer: 15 mL/min — ABNORMAL LOW (ref >60)
Glucose, Bld: 256 mg/dL — ABNORMAL HIGH (ref 70–99)
Potassium: 4.1 mmol/L (ref 3.5–5.1)
Sodium: 135 mmol/L (ref 135–145)
Total Bilirubin: 0.9 mg/dL (ref 0.3–1.2)
Total Protein: 4.6 g/dL — ABNORMAL LOW (ref 6.5–8.1)

## 2019-03-01 LAB — CBC
HCT: 25 % — ABNORMAL LOW (ref 36.0–46.0)
Hemoglobin: 8.6 g/dL — ABNORMAL LOW (ref 12.0–15.0)
MCH: 30.2 pg (ref 26.0–34.0)
MCHC: 34.4 g/dL (ref 30.0–36.0)
MCV: 87.7 fL (ref 80.0–100.0)
Platelets: 184 10*3/uL (ref 150–400)
RBC: 2.85 MIL/uL — ABNORMAL LOW (ref 3.87–5.11)
RDW: 20.9 % — ABNORMAL HIGH (ref 11.5–15.5)
WBC: 8.3 10*3/uL (ref 4.0–10.5)
nRBC: 0 % (ref 0.0–0.2)

## 2019-03-01 LAB — PHOSPHORUS
Phosphorus: 1.9 mg/dL — ABNORMAL LOW (ref 2.5–4.6)
Phosphorus: 3.2 mg/dL (ref 2.5–4.6)

## 2019-03-01 LAB — GLUCOSE, CAPILLARY
Glucose-Capillary: 161 mg/dL — ABNORMAL HIGH (ref 70–99)
Glucose-Capillary: 177 mg/dL — ABNORMAL HIGH (ref 70–99)
Glucose-Capillary: 182 mg/dL — ABNORMAL HIGH (ref 70–99)
Glucose-Capillary: 223 mg/dL — ABNORMAL HIGH (ref 70–99)
Glucose-Capillary: 243 mg/dL — ABNORMAL HIGH (ref 70–99)
Glucose-Capillary: 284 mg/dL — ABNORMAL HIGH (ref 70–99)

## 2019-03-01 LAB — MAGNESIUM: Magnesium: 1.9 mg/dL (ref 1.7–2.4)

## 2019-03-01 LAB — HEPARIN LEVEL (UNFRACTIONATED): Heparin Unfractionated: 0.5 IU/mL (ref 0.30–0.70)

## 2019-03-01 LAB — BRAIN NATRIURETIC PEPTIDE: B Natriuretic Peptide: 720 pg/mL — ABNORMAL HIGH (ref 0.0–100.0)

## 2019-03-01 LAB — POTASSIUM: Potassium: 4.2 mmol/L (ref 3.5–5.1)

## 2019-03-01 MED ORDER — INSULIN ASPART 100 UNIT/ML ~~LOC~~ SOLN
0.0000 [IU] | SUBCUTANEOUS | Status: DC
  Administered 2019-03-01: 5 [IU] via SUBCUTANEOUS
  Administered 2019-03-01 (×2): 3 [IU] via SUBCUTANEOUS
  Administered 2019-03-02 (×2): 2 [IU] via SUBCUTANEOUS
  Administered 2019-03-02: 3 [IU] via SUBCUTANEOUS
  Administered 2019-03-02: 2 [IU] via SUBCUTANEOUS
  Administered 2019-03-03 – 2019-03-04 (×2): 3 [IU] via SUBCUTANEOUS
  Administered 2019-03-05: 5 [IU] via SUBCUTANEOUS
  Administered 2019-03-05: 2 [IU] via SUBCUTANEOUS
  Administered 2019-03-05 – 2019-03-06 (×2): 3 [IU] via SUBCUTANEOUS
  Administered 2019-03-06: 04:00:00 5 [IU] via SUBCUTANEOUS
  Administered 2019-03-06 (×3): 2 [IU] via SUBCUTANEOUS
  Administered 2019-03-06: 3 [IU] via SUBCUTANEOUS
  Administered 2019-03-07: 2 [IU] via SUBCUTANEOUS
  Administered 2019-03-07: 04:00:00 3 [IU] via SUBCUTANEOUS
  Administered 2019-03-07: 5 [IU] via SUBCUTANEOUS
  Administered 2019-03-07: 08:00:00 3 [IU] via SUBCUTANEOUS
  Administered 2019-03-08 (×2): 2 [IU] via SUBCUTANEOUS
  Administered 2019-03-08 (×3): 3 [IU] via SUBCUTANEOUS
  Administered 2019-03-08: 2 [IU] via SUBCUTANEOUS
  Administered 2019-03-09 (×2): 5 [IU] via SUBCUTANEOUS
  Administered 2019-03-09: 2 [IU] via SUBCUTANEOUS
  Administered 2019-03-10 (×3): 8 [IU] via SUBCUTANEOUS
  Administered 2019-03-11 (×2): 2 [IU] via SUBCUTANEOUS
  Administered 2019-03-11: 5 [IU] via SUBCUTANEOUS
  Administered 2019-03-11: 2 [IU] via SUBCUTANEOUS
  Administered 2019-03-12 (×5): 3 [IU] via SUBCUTANEOUS
  Administered 2019-03-12: 2 [IU] via SUBCUTANEOUS
  Administered 2019-03-13 (×2): 5 [IU] via SUBCUTANEOUS
  Administered 2019-03-13: 8 [IU] via SUBCUTANEOUS
  Administered 2019-03-13: 3 [IU] via SUBCUTANEOUS
  Administered 2019-03-14 (×2): 5 [IU] via SUBCUTANEOUS
  Administered 2019-03-14: 3 [IU] via SUBCUTANEOUS
  Administered 2019-03-14: 2 [IU] via SUBCUTANEOUS
  Administered 2019-03-14 – 2019-03-15 (×5): 3 [IU] via SUBCUTANEOUS
  Administered 2019-03-15: 2 [IU] via SUBCUTANEOUS
  Administered 2019-03-15: 3 [IU] via SUBCUTANEOUS
  Administered 2019-03-15 – 2019-03-16 (×2): 2 [IU] via SUBCUTANEOUS
  Administered 2019-03-16 (×2): 3 [IU] via SUBCUTANEOUS
  Administered 2019-03-17: 5 [IU] via SUBCUTANEOUS
  Administered 2019-03-17: 3 [IU] via SUBCUTANEOUS
  Administered 2019-03-17: 2 [IU] via SUBCUTANEOUS
  Administered 2019-03-17: 3 [IU] via SUBCUTANEOUS
  Administered 2019-03-17 – 2019-03-18 (×2): 2 [IU] via SUBCUTANEOUS
  Administered 2019-03-18 (×2): 3 [IU] via SUBCUTANEOUS
  Administered 2019-03-18: 8 [IU] via SUBCUTANEOUS
  Administered 2019-03-19: 5 [IU] via SUBCUTANEOUS
  Administered 2019-03-19: 3 [IU] via SUBCUTANEOUS
  Administered 2019-03-19 (×2): 2 [IU] via SUBCUTANEOUS
  Administered 2019-03-19: 3 [IU] via SUBCUTANEOUS
  Administered 2019-03-19: 23:00:00 2 [IU] via SUBCUTANEOUS
  Administered 2019-03-20: 04:00:00 3 [IU] via SUBCUTANEOUS
  Administered 2019-03-20: 2 [IU] via SUBCUTANEOUS
  Administered 2019-03-21: 3 [IU] via SUBCUTANEOUS
  Administered 2019-03-21: 5 [IU] via SUBCUTANEOUS
  Administered 2019-03-21 – 2019-03-22 (×9): 3 [IU] via SUBCUTANEOUS
  Administered 2019-03-22: 2 [IU] via SUBCUTANEOUS
  Administered 2019-03-23 (×5): 3 [IU] via SUBCUTANEOUS
  Administered 2019-03-24 (×4): 2 [IU] via SUBCUTANEOUS
  Administered 2019-03-25 (×2): 3 [IU] via SUBCUTANEOUS
  Administered 2019-03-25 – 2019-03-26 (×4): 5 [IU] via SUBCUTANEOUS
  Administered 2019-03-26: 2 [IU] via SUBCUTANEOUS
  Administered 2019-03-26 (×2): 5 [IU] via SUBCUTANEOUS
  Administered 2019-03-26: 3 [IU] via SUBCUTANEOUS
  Administered 2019-03-27 (×2): 5 [IU] via SUBCUTANEOUS
  Administered 2019-03-27 (×2): 3 [IU] via SUBCUTANEOUS
  Administered 2019-03-27: 8 [IU] via SUBCUTANEOUS
  Administered 2019-03-27: 3 [IU] via SUBCUTANEOUS
  Administered 2019-03-28: 5 [IU] via SUBCUTANEOUS
  Administered 2019-03-28: 2 [IU] via SUBCUTANEOUS
  Administered 2019-03-28 – 2019-03-29 (×8): 3 [IU] via SUBCUTANEOUS
  Administered 2019-03-29 – 2019-03-30 (×2): 2 [IU] via SUBCUTANEOUS
  Administered 2019-03-30: 3 [IU] via SUBCUTANEOUS
  Administered 2019-03-30: 2 [IU] via SUBCUTANEOUS
  Administered 2019-03-31: 8 [IU] via SUBCUTANEOUS
  Administered 2019-03-31: 3 [IU] via SUBCUTANEOUS
  Administered 2019-03-31: 2 [IU] via SUBCUTANEOUS
  Administered 2019-03-31 (×2): 3 [IU] via SUBCUTANEOUS
  Administered 2019-04-01: 2 [IU] via SUBCUTANEOUS
  Administered 2019-04-01: 3 [IU] via SUBCUTANEOUS
  Administered 2019-04-01 – 2019-04-02 (×6): 2 [IU] via SUBCUTANEOUS
  Administered 2019-04-02: 3 [IU] via SUBCUTANEOUS
  Administered 2019-04-02: 2 [IU] via SUBCUTANEOUS
  Administered 2019-04-03: 3 [IU] via SUBCUTANEOUS
  Administered 2019-04-03 (×3): 2 [IU] via SUBCUTANEOUS
  Administered 2019-04-04 (×2): 3 [IU] via SUBCUTANEOUS
  Administered 2019-04-04: 5 [IU] via SUBCUTANEOUS
  Administered 2019-04-05: 3 [IU] via SUBCUTANEOUS
  Administered 2019-04-05: 2 [IU] via SUBCUTANEOUS
  Administered 2019-04-05 (×2): 3 [IU] via SUBCUTANEOUS
  Administered 2019-04-05 – 2019-04-08 (×9): 2 [IU] via SUBCUTANEOUS
  Administered 2019-04-12: 3 [IU] via SUBCUTANEOUS
  Administered 2019-04-12 (×2): 2 [IU] via SUBCUTANEOUS
  Administered 2019-04-12: 3 [IU] via SUBCUTANEOUS
  Administered 2019-04-13 (×4): 2 [IU] via SUBCUTANEOUS
  Administered 2019-04-14: 3 [IU] via SUBCUTANEOUS
  Administered 2019-04-14 (×2): 2 [IU] via SUBCUTANEOUS
  Administered 2019-04-14 (×2): 3 [IU] via SUBCUTANEOUS
  Administered 2019-04-15: 2 [IU] via SUBCUTANEOUS
  Filled 2019-03-01 (×179): qty 1

## 2019-03-01 MED ORDER — POTASSIUM PHOSPHATES 15 MMOLE/5ML IV SOLN
20.0000 mmol | Freq: Once | INTRAVENOUS | Status: AC
  Administered 2019-03-01: 20 mmol via INTRAVENOUS
  Filled 2019-03-01: qty 6.67

## 2019-03-01 MED ORDER — INSULIN ASPART 100 UNIT/ML ~~LOC~~ SOLN
3.0000 [IU] | SUBCUTANEOUS | Status: DC
  Administered 2019-03-01 – 2019-03-03 (×9): 3 [IU] via SUBCUTANEOUS
  Filled 2019-03-01 (×9): qty 1

## 2019-03-01 NOTE — Progress Notes (Signed)
Pharmacy Electrolyte Monitoring Consult:  Pharmacy consulted to assist in monitoring and replacing electrolytes in this 63 y.o. female admitted on 02/09/2019. Patient with ESRD; last dialysis 12/14. Patient with MRSA bacteremia. She has a h/o CAD and is s/p CABG. Hemodialysis MWF.   Labs:  Sodium (mmol/L)  Date Value  03/01/2019 135  03/30/2014 140   Potassium (mmol/L)  Date Value  03/01/2019 4.1  03/30/2014 4.3   Magnesium (mg/dL)  Date Value  03/01/2019 1.9  07/19/2013 1.7 (L)   Phosphorus (mg/dL)  Date Value  03/01/2019 1.9 (L)   Calcium (mg/dL)  Date Value  03/01/2019 8.2 (L)   Calcium, Total (mg/dL)  Date Value  03/30/2014 9.0   Albumin (g/dL)  Date Value  03/01/2019 1.8 (L)  02/11/2018 4.2  07/23/2013 2.7 (L)   Corrected Ca: 10.5 mg/dL  Goals of Therapy Given Cardiac History: Potassium 4.0 - 5.1 mmol/L Magnesium 2.0 - 2.4 mg/dL All Other Electrolytes WNL   Plan:  K+ 4.1  Phos 1.9 - Will order Kphos IV 20 mmol x 1 again today.  Will recheck Phos at 2000.  (KPhos is conservative dosing as pt is on Hemodialysis)  electrolytes in am  Pharmacy will continue to monitor and adjust per consult.   Chinita Greenland PharmD Clinical Pharmacist 03/01/2019

## 2019-03-01 NOTE — Progress Notes (Signed)
Inpatient Diabetes Program Recommendations  AACE/ADA: New Consensus Statement on Inpatient Glycemic Control   Target Ranges:  Prepandial:   less than 140 mg/dL      Peak postprandial:   less than 180 mg/dL (1-2 hours)      Critically ill patients:  140 - 180 mg/dL   Results for Jamie Arias, Jamie Arias (MRN [id number]) as of 03/01/2019 08:46  Ref. Range 02/28/2019 08:20 02/28/2019 11:14 02/28/2019 17:06 02/28/2019 20:50 02/28/2019 21:33 03/01/2019 07:41  Glucose-Capillary Latest Ref Range: 70 - 99 mg/dL 366 (H)  Novolog 9 units  Lantus 10 units@10 :36 285 (H)  Novolog 8 units 229 (H)  Novolog 5 units 184 (H) 203 (H) 284 (H)  Novolog 8 units  Lantus 10 units   Review of Glycemic Control  Diabetes history: DM2 Outpatient Diabetes medications: Basaglar 18 units daily Current orders for Inpatient glycemic control: Lantus 10 units daily, Novolog 0-15 units TID with meals  Inpatient Diabetes Program Recommendations:   Insulin-Correction: Please consider changing frequency of CBGs and Novolog correction to 0-15 units Q4H since patient is NPO and receiving tube feedings.  Insulin-Tube Feeding Coverage:  Please consider ordering Novolog 3 units Q4H for tube feeding coverage. If tube feeding is stopped or held then Novolog tube feeding coverage should also be stopped or held.  Thanks, Barnie Alderman, RN, MSN, CDE Diabetes Coordinator Inpatient Diabetes Program [phone] (Team Pager from 8am to 5pm)

## 2019-03-01 NOTE — Progress Notes (Signed)
Pharmacy Electrolyte Monitoring Consult:  Pharmacy consulted to assist in monitoring and replacing electrolytes in this 63 y.o. female admitted on 02/09/2019. Patient with ESRD; last dialysis 12/14. Patient with MRSA bacteremia. She has a h/o CAD and is s/p CABG. Hemodialysis MWF.   Labs:  Sodium (mmol/L)  Date Value  03/01/2019 135  03/30/2014 140   Potassium (mmol/L)  Date Value  03/01/2019 4.2  03/30/2014 4.3   Magnesium (mg/dL)  Date Value  03/01/2019 1.9  07/19/2013 1.7 (L)   Phosphorus (mg/dL)  Date Value  03/01/2019 3.2   Calcium (mg/dL)  Date Value  03/01/2019 8.2 (L)   Calcium, Total (mg/dL)  Date Value  03/30/2014 9.0   Albumin (g/dL)  Date Value  03/01/2019 1.8 (L)  02/11/2018 4.2  07/23/2013 2.7 (L)   Corrected Ca: 10.5 mg/dL  Goals of Therapy Given Cardiac History: Potassium 4.0 - 5.1 mmol/L Magnesium 2.0 - 2.4 mg/dL All Other Electrolytes WNL   Plan:  K+ 4.1  Phos 1.9 - Will order Kphos IV 20 mmol x 1 again today.  Will recheck Phos at 2000.    Labs@2000 : K 4.2, Phos 3.2  No further supplementation needed at this time  (KPhos is conservative dosing as pt is on Hemodialysis)  electrolytes in am  Pharmacy will continue to monitor and adjust per consult.   Pearla Dubonnet, PharmD Clinical Pharmacist 03/01/2019 9:30 PM

## 2019-03-01 NOTE — Consult Note (Signed)
ANTICOAGULATION CONSULT NOTE  Pharmacy Consult for heparin Indication: DVT  Patient Measurements: Height: 5\' 4"  (162.6 cm) Weight: 161 lb 6 oz (73.2 kg) IBW/kg (Calculated) : 54.7 Heparin Dosing Weight: 71 kg  Vital Signs: Temp: 99.1 F (37.3 C) (12/27 0421) Temp Source: Oral (12/27 0421) BP: 135/48 (12/27 0421) Pulse Rate: 69 (12/27 0421)  Labs: Recent Labs    02/27/19 0413 02/28/19 0626 02/28/19 1313 02/28/19 2202 03/01/19 0429  HGB 9.1* 8.6*  --   --  8.6*  HCT 27.1* 25.3*  --   --  25.0*  PLT 170 182  --   --  184  HEPARINUNFRC 0.28* 0.16* 0.54 0.51 0.50  CREATININE 1.54* 2.46*  --   --   --    Estimated Creatinine Clearance: 22.4 mL/min (A) (by C-G formula based on SCr of 2.46 mg/dL (H)).  Medical History: Past Medical History:  Diagnosis Date  . Anemia   . Chronic diastolic CHF (congestive heart failure) ([place])    a. Due to ischemic cardiomyopathy. EF as low as 35%, improved to normal s/p CABG; b. echo 07/06/13: EF 55-60%, no RWMAs, mod TR, trivial pericardial effusion not c/w tamponade physiology;  c. 10/2015 Echo: EF 65%, Gr1 DD, triv AI, mild MR, mildly dil LA, mod TR, PASP 19mmHg.  Marland Kitchen Coronary artery disease    a. NSTEMI 06/2013; b.cath: severe three-vessel CAD w/ EF 30% & mild-mod MR; c. s/p 3 vessel CABG 07/02/13 (LIMA-LAD, SVG-OM, and SVG-RPDA);  d. 10/2015 MV: no ischemia/infarct.  . Diabetes mellitus without complication ([place])   . Diabetic neuropathy ([place])   . Dialysis patient [facility])    MWF  . ESRD (end stage renal disease) ([place])    a. 12/2015 initiated - mwf dialysis.  Marland Kitchen GERD (gastroesophageal reflux disease)   . Hyperlipidemia   . Hypertension   . Hypothyroidism   . Myocardial infarction ([place]) 2015  . Neuropathy   . Pleural effusion 2015  . Pulmonary hypertension ([place])   . Renal insufficiency   . Wears dentures    full lower    Medications:  Scheduled:  . aspirin  81 mg Oral Daily  . B-complex with vitamin C  1 tablet Per Tube Daily  .  chlorhexidine  15 mL Mouth Rinse BID  . Chlorhexidine Gluconate Cloth  6 each Topical Q0600  . epoetin (EPOGEN/PROCRIT) injection  4,000 Units Intravenous Q M,W,F-HD  . feeding supplement (PRO-STAT SUGAR FREE 64)  30 mL Per Tube Daily  . fluticasone  2 spray Each Nare Daily  . free water  30 mL Per Tube Q4H  . insulin aspart  0-15 Units Subcutaneous TID WC  . insulin glargine  10 Units Subcutaneous Daily  . levothyroxine  88 mcg Oral QAC breakfast  . mouth rinse  15 mL Mouth Rinse q12n4p  . midodrine  10 mg Oral TID WC  . pantoprazole (PROTONIX) IV  40 mg Intravenous Q24H  . venlafaxine  75 mg Oral BID WC  . zinc sulfate  220 mg Oral Daily    Assessment: 63 y.o. female admitted on 02/09/2019 with MRSA  bacteremia. She was on apixaban PTA but last dose was prior to admission (12/7). [place] 12/15 revealed age-indeterminate short segment occlusive DVT involving the mid humeral aspect of one of the paired brachial veins.  Heparin was started but after repeated attempts by multiple phlebotomists lab was unable to draw labs required for heparin monitoring. Additionally, she was too confused and disoriented to swallow oral medications. On 12/20 a femoral  central line was placed. Heparin was stopped this morning due to a fall and is being restarted upon returning from HD  Heparin Course: 12/24 0231 HL 0.26: held due to unwitnessed fall and bleeding from arm 12/25 0413 HL 0.28: subtherapeutic - CBC stable.  Will increase Heparin to 900 units/hr and recheck in 8 hours. 12/25 1356 HL 0.47: therapeutic  12/25 2020 HL 0.36: therapeutic x 2 12/26 HL @0626 = 0.16. Will give Heparin bolus of 2100 units and increase heparin drip to 1100 units/hr  Goal of Therapy:  Heparin Level: 0.3 - 0.7 units/mL Monitor platelets by anticoagulation protocol: Yes   Plan:  12/26 HL @1313 = 0.54 12/26 HL @2202 = 0.51, therapeutic x 2 12/27 HL @0429 = 0.50, therapeutic x 3.  CBC stable   Heparin level therapeutic  Will  continue current heparin drip at 1100 units/hr  CBC & HL in am  Ena Dawley, PharmD Clinical Pharmacist 03/01/2019 5:23 AM

## 2019-03-01 NOTE — Progress Notes (Signed)
[name], female   DOB: 1955-09-07, 63 y.o.   MRN: [id number] [facility] PROGRESS NOTE  Jamie Arias [id number] DOB: 05/26/1955 DOA: 02/09/2019 PCP: [name], MD  HPI/Subjective: Patient opened up her eyes and looked at me but then closed her eyes back again.  She was able to lift her hands up off the bed on her own but did not squeeze my hands when I asked her to.  As per the husband, she had a normal mental status at home but was not able to walk very well.  Since she is got sick she has gotten worse.  Objective: Vitals:   02/28/19 1917 03/01/19 0421  BP: (!) 114/57 (!) 135/48  Pulse: 68 69  Resp: 18 18  Temp: 98.9 F (37.2 C) 99.1 F (37.3 C)  SpO2: 98% 96%    Intake/Output Summary (Last 24 hours) at 03/01/2019 1410 Last data filed at 03/01/2019 0325 Gross per 24 hour  Intake 1281.06 ml  Output --  Net 1281.06 ml   Filed Weights   02/23/19 1333 02/25/19 0842 02/25/19 1200  Weight: 73.4 kg 74.1 kg 73.2 kg    ROS: Review of Systems  Unable to perform ROS: Acuity of condition   Exam: Physical Exam  HENT:  Nose: No mucosal edema.  Eyes: Pupils are equal, round, and reactive to light. Conjunctivae and lids are normal.  Neck: Carotid bruit is not present.  Cardiovascular: S1 normal and S2 normal. Exam reveals no gallop.  No murmur heard. Pulses:      Dorsalis pedis pulses are 2+ on the right side and 2+ on the left side.  Respiratory: No respiratory distress. She has decreased breath sounds in the right lower field and the left lower field. She has no wheezes. She has no rhonchi. She has no rales.  GI: Soft. Bowel sounds are normal. There is no abdominal tenderness.  Musculoskeletal:     Right ankle: No swelling.     Left ankle: No swelling.  Lymphadenopathy:    She has no cervical adenopathy.  Neurological: She is alert.  Able to lift hands off the bed but not able to squeeze with her hands when I asked her to.  Skin: Skin is  warm. Nails show no clubbing.  Left heel covered. Right heel stage II decubitus ulcer  Psychiatric:  Unable to test secondary to altered mental status      Data Reviewed: Basic Metabolic Panel: Recent Labs  Lab 02/25/19 0640 02/26/19 0231 02/27/19 0413 02/28/19 0626 03/01/19 0429  NA 140 141  140 141 138 135  K 3.5 3.3*  3.4* 3.0* 3.4* 4.1  CL 105 100  100 98 97* 96*  CO2 21* 26  22 31 29 26   GLUCOSE 194* 198*  198* 219* 365* 256*  BUN 16 9  10  6* 15 21  CREATININE 3.35* 2.29*  2.25* 1.54* 2.46* 3.03*  CALCIUM 8.1* 7.9*  7.8* 8.0* 8.0* 8.2*  MG 2.1 1.7 1.9 1.8 1.9  PHOS 3.5 1.9* 1.0* 2.1* 1.9*   Liver Function Tests: Recent Labs  Lab 02/26/19 0231 02/27/19 0413 02/28/19 0626 03/01/19 0429  AST 26 22 22 22   ALT 20 18 19 17   ALKPHOS 59 59 63 58  BILITOT 1.9* 1.7* 1.0 0.9  PROT 4.9* 5.0* 4.9* 4.6*  ALBUMIN 2.1*  2.1* 2.1* 2.0* 1.8*   CBC: Recent Labs  Lab 02/26/19 0231 02/26/19 1413 02/27/19 0413 02/28/19 0626 03/01/19 0429  WBC 6.7 5.8 6.8 6.6  8.3  HGB 9.4* 8.9* 9.1* 8.6* 8.6*  HCT 29.1* 27.2* 27.1* 25.3* 25.0*  MCV 89.8 89.2 87.1 87.5 87.7  PLT 179 155 170 182 184   Cardiac Enzymes: Recent Labs  Lab 02/23/19 0539  CKTOTAL 29*   BNP (last 3 results) Recent Labs    03/01/19 0429  BNP 720.0*     CBG: Recent Labs  Lab 02/28/19 1706 02/28/19 2050 02/28/19 2133 03/01/19 0741 03/01/19 1303  GLUCAP 229* 184* 203* 284* 177*     Studies: DG Chest Port 1 View  Result Date: 03/01/2019 CLINICAL DATA:  Dyspnea. EXAM: PORTABLE CHEST 1 VIEW COMPARISON:  02/09/2019 FINDINGS: Enteric tube courses through the region of the stomach and into the region of the duodenum in the right abdomen as tip is not definitely visualized. Left IJ dialysis catheter has tip over the SVC. Lungs are adequately inflated and demonstrate subtle hazy prominence of the perihilar markings likely mild vascular congestion unchanged. Stable small amount left pleural  fluid likely with associated left basilar atelectasis. Mild stable cardiomegaly. Remainder of the exam is unchanged. IMPRESSION: 1. Mild stable vascular congestion. Stable small left pleural effusion likely with associated basilar atelectasis. 2.  Tubes and lines as described. Electronically Signed   By: Marin Olp M.D.   On: 03/01/2019 06:58    Scheduled Meds: . aspirin  81 mg Oral Daily  . B-complex with vitamin C  1 tablet Per Tube Daily  . chlorhexidine  15 mL Mouth Rinse BID  . Chlorhexidine Gluconate Cloth  6 each Topical Q0600  . epoetin (EPOGEN/PROCRIT) injection  4,000 Units Intravenous Q M,W,F-HD  . feeding supplement (PRO-STAT SUGAR FREE 64)  30 mL Per Tube Daily  . fluticasone  2 spray Each Nare Daily  . free water  30 mL Per Tube Q4H  . insulin aspart  0-15 Units Subcutaneous Q4H  . insulin aspart  3 Units Subcutaneous Q4H  . insulin glargine  10 Units Subcutaneous Daily  . levothyroxine  88 mcg Oral QAC breakfast  . mouth rinse  15 mL Mouth Rinse q12n4p  . midodrine  10 mg Oral TID WC  . pantoprazole (PROTONIX) IV  40 mg Intravenous Q24H  . venlafaxine  75 mg Oral BID WC  . zinc sulfate  220 mg Oral Daily   Continuous Infusions: . sodium chloride 10 mL/hr at 03/01/19 0325  . sodium chloride    . DAPTOmycin (CUBICIN)  IV Stopped (02/28/19 2242)  . feeding supplement (OSMOLITE 1.5 CAL) 1,000 mL (03/01/19 1139)  . heparin 1,100 Units/hr (03/01/19 0404)  . potassium PHOSPHATE IVPB (in mmol) 20 mmol (03/01/19 1146)  . vancomycin      Assessment/Plan:   1. Sepsis secondary to MRSA bacteremia.  Currently on daptomycin and vancomycin.  Hopefully can get rid of the daptomycin once vancomycin level is therapeutic.  Appreciate ID consultation.  TEE recommended but mental status still impaired. 2. Acute blood loss anemia from right arm this has resolved. 3. End-stage renal disease on hemodialysis.  Tunneled catheter placed by vascular. 4. Acute metabolic encephalopathy.   Mental status still impaired.  MRI of the brain negative.  EEG negative.  Ammonia normal.  TSH normal. 5. Left arm DVT on heparin drip 6. Chronic atrial fibrillation on amiodarone.  Eliquis on hold for right now currently on heparin drip. 7. Chronic diastolic congestive heart failure.  Dialysis to manage fluid status. 8. Type 2 diabetes mellitus on low-dose Lantus.  Since patient on tube feeding will do NovoLog 3 units  every 4 hours. 9. Hypothyroidism unspecified on Synthroid 10. Overall prognosis is poor.  Palliative care following.  Patient a DNR. 11. Diarrhea.  Nurse to place rectal tube.  Could be feeding related.  Will hold off on any stool testing at this point. 12. Stage II decubiti right heel  Code Status:     Code Status Orders  (From admission, onward)         Start     Ordered   02/21/19 0930  Do not attempt resuscitation (DNR)  Continuous    Question Answer Comment  In the event of cardiac or respiratory ARREST Do not call a "code blue"   In the event of cardiac or respiratory ARREST Do not perform Intubation, CPR, defibrillation or ACLS   In the event of cardiac or respiratory ARREST Use medication by any route, position, wound care, and other measures to relive pain and suffering. May use oxygen, suction and manual treatment of airway obstruction as needed for comfort.      02/21/19 0932        Code Status History    Date Active Date Inactive Code Status Order ID Comments User Context   02/09/2019 [id number] 02/21/2019 0932 Full Code [id number]  Thornell Mule, MD ED   08/24/2018 0022 09/15/2018 1720 Full Code [id number]  Shela Leff, MD Inpatient   09/04/2017 1510 09/06/2017 1920 Full Code [id number]  Epifanio Lesches, MD ED   02/15/2017 1243 02/16/2017 2023 Full Code [id number]  Saundra Shelling, MD Inpatient   10/23/2015 0828 10/23/2015 0908 Full Code [id number]  Saundra Shelling, MD Inpatient   09/03/2015 0130 09/03/2015 1619 Full Code [id number]  Lance Coon, MD Inpatient    07/02/2013 1327 07/14/2013 1540 Full Code [id number]  Nani Skillern, PA-C Inpatient   06/29/2013 2153 07/02/2013 1327 Full Code [id number]  Larey Dresser, MD Inpatient   Advance Care Planning Activity     Family Communication: Spoke with husband on the phone Disposition Plan: To be determined  Consultants:  Nephrology  Infectious disease  Antibiotics:  Daptomycin  Vancomycin  Time spent: 28 minutes  [place]

## 2019-03-01 NOTE — Progress Notes (Signed)
[facility]  ROUNDING NOTE   Subjective:   Husband at bedside. Patient continues to have minimal reaction to stimuli.   Objective:  Vital signs in last 24 hours:  Temp:  [98.9 F (37.2 C)-99.1 F (37.3 C)] 99.1 F (37.3 C) (12/27 0421) Pulse Rate:  [68-69] 69 (12/27 0421) Resp:  [18] 18 (12/27 0421) BP: (114-135)/(48-57) 135/48 (12/27 0421) SpO2:  [96 %-98 %] 96 % (12/27 0421)  Weight change:  Filed Weights   02/23/19 1333 02/25/19 0842 02/25/19 1200  Weight: 73.4 kg 74.1 kg 73.2 kg    Intake/Output: I/O last 3 completed shifts: In: 1364.4 [I.V.:426.3; IV Piggyback:938.1] Out: -    Intake/Output this shift:  No intake/output data recorded.  Physical Exam: General: NAD, laying in bed  Head: OGT   Eyes: Anicteric, PERRL  Neck: Supple, trachea midline  Lungs:  Clear to auscultation  Heart: Regular rate and rhythm  Abdomen:  Soft, nontender,   Extremities: + peripheral edema.  Neurologic: Lethargic,responds to verbal stimuli.   Skin: No lesions  Access: Left AVG, bleeding over access    Basic Metabolic Panel: Recent Labs  Lab 02/25/19 0640 02/26/19 0231 02/27/19 0413 02/28/19 0626 03/01/19 0429  NA 140 141  140 141 138 135  K 3.5 3.3*  3.4* 3.0* 3.4* 4.1  CL 105 100  100 98 97* 96*  CO2 21* 26  22 31 29 26   GLUCOSE 194* 198*  198* 219* 365* 256*  BUN 16 9  10  6* 15 21  CREATININE 3.35* 2.29*  2.25* 1.54* 2.46* 3.03*  CALCIUM 8.1* 7.9*  7.8* 8.0* 8.0* 8.2*  MG 2.1 1.7 1.9 1.8 1.9  PHOS 3.5 1.9* 1.0* 2.1* 1.9*    Liver Function Tests: Recent Labs  Lab 02/26/19 0231 02/27/19 0413 02/28/19 0626 03/01/19 0429  AST 26 22 22 22   ALT 20 18 19 17   ALKPHOS 59 59 63 58  BILITOT 1.9* 1.7* 1.0 0.9  PROT 4.9* 5.0* 4.9* 4.6*  ALBUMIN 2.1*  2.1* 2.1* 2.0* 1.8*   No results for input(s): LIPASE, AMYLASE in the last 168 hours. No results for input(s): AMMONIA in the last 168 hours.  CBC: Recent Labs  Lab 02/26/19 0231  02/26/19 1413 02/27/19 0413 02/28/19 0626 03/01/19 0429  WBC 6.7 5.8 6.8 6.6 8.3  HGB 9.4* 8.9* 9.1* 8.6* 8.6*  HCT 29.1* 27.2* 27.1* 25.3* 25.0*  MCV 89.8 89.2 87.1 87.5 87.7  PLT 179 155 170 182 184    Cardiac Enzymes: Recent Labs  Lab 02/23/19 0539  CKTOTAL 29*    BNP: Invalid input(s): POCBNP  CBG: Recent Labs  Lab 02/28/19 1114 02/28/19 1706 02/28/19 2050 02/28/19 2133 03/01/19 0741  GLUCAP 285* 229* 184* 203* 284*    Microbiology: Results for orders placed or performed during the hospital encounter of 02/09/19  Culture, blood (routine x 2)     Status: Abnormal   Collection Time: 02/09/19  2:29 PM   Specimen: BLOOD LEFT ARM  Result Value Ref Range Status   Specimen Description   Final    BLOOD LEFT ARM Performed at [facility], [address], [place], [place] [address]    Special Requests   Final    BOTTLES DRAWN AEROBIC AND ANAEROBIC Blood Culture adequate volume Performed at [facility], [address] [place], [place] [address]    Culture  Setup Time   Final    GRAM POSITIVE COCCI IN BOTH AEROBIC AND ANAEROBIC BOTTLES CRITICAL VALUE NOTED.  VALUE IS  CONSISTENT WITH PREVIOUSLY REPORTED AND CALLED VALUE. Performed at [facility], [address] [place], [place] [address]    Culture (A)  Final    STAPHYLOCOCCUS AUREUS SUSCEPTIBILITIES PERFORMED ON PREVIOUS CULTURE WITHIN THE LAST 5 DAYS. Performed at [facility], [place] [address] [place], [place] [address]    Report Status 02/12/2019 FINAL  Final  Culture, blood (routine x 2)     Status: Abnormal   Collection Time: 02/09/19  2:38 PM   Specimen: BLOOD LEFT ARM  Result Value Ref Range Status   Specimen Description   Final    BLOOD LEFT ARM Performed at [facility], [address], [place], [place] [address]    Special Requests   Final    BOTTLES DRAWN AEROBIC AND ANAEROBIC Blood Culture adequate volume Performed at [facility], [address] [place], [place] [address]    Culture  Setup Time   Final    GRAM POSITIVE COCCI IN BOTH AEROBIC AND ANAEROBIC BOTTLES CRITICAL RESULT CALLED TO, READ BACK BY AND VERIFIED WITH: [place] [id number] 02/10/2019 HNM Performed at [facility], [place] [address] [place], [place] [address]    Culture METHICILLIN RESISTANT STAPHYLOCOCCUS AUREUS (A)  Final   Report Status 02/12/2019 FINAL  Final   Organism ID, Bacteria METHICILLIN RESISTANT STAPHYLOCOCCUS AUREUS  Final      Susceptibility   Methicillin resistant staphylococcus aureus - MIC*    CIPROFLOXACIN >=8 RESISTANT Resistant     ERYTHROMYCIN >=8 RESISTANT Resistant     GENTAMICIN <=0.5 SENSITIVE Sensitive     OXACILLIN >=4 RESISTANT Resistant     TETRACYCLINE <=1 SENSITIVE Sensitive     VANCOMYCIN 1 SENSITIVE Sensitive     TRIMETH/SULFA <=10 SENSITIVE Sensitive     CLINDAMYCIN RESISTANT Resistant     RIFAMPIN <=0.5 SENSITIVE Sensitive     Inducible Clindamycin POSITIVE Resistant     * METHICILLIN RESISTANT STAPHYLOCOCCUS AUREUS  Blood Culture ID Panel (Reflexed)     Status: Abnormal   Collection Time: 02/09/19  2:38 PM  Result Value Ref Range Status   Enterococcus species NOT DETECTED NOT DETECTED Final   Listeria monocytogenes NOT DETECTED NOT DETECTED Final   Staphylococcus species DETECTED (A) NOT DETECTED Final    Comment: CRITICAL RESULT CALLED TO, READ BACK BY AND VERIFIED WITH: Hart Robinsons PHARMD [id number] 02/10/2019 HNM    Staphylococcus aureus (BCID) DETECTED (A) NOT DETECTED Final    Comment: Methicillin (oxacillin)-resistant Staphylococcus aureus (MRSA). MRSA is predictably resistant to beta-lactam antibiotics (except ceftaroline). Preferred therapy is vancomycin unless clinically contraindicated. Patient requires contact precautions if  hospitalized. CRITICAL RESULT CALLED TO, READ BACK BY AND VERIFIED WITH: Hart Robinsons PHARMD [id number] 02/10/2019 HNM    Methicillin resistance DETECTED (A) NOT DETECTED Final     Comment: CRITICAL RESULT CALLED TO, READ BACK BY AND VERIFIED WITH: Hart Robinsons PHARMD [id number] 02/10/2019 HNM    Streptococcus species NOT DETECTED NOT DETECTED Final   Streptococcus agalactiae NOT DETECTED NOT DETECTED Final   Streptococcus pneumoniae NOT DETECTED NOT DETECTED Final   Streptococcus pyogenes NOT DETECTED NOT DETECTED Final   Acinetobacter baumannii NOT DETECTED NOT DETECTED Final   Enterobacteriaceae species NOT DETECTED NOT DETECTED Final   Enterobacter cloacae complex NOT DETECTED NOT DETECTED Final   Escherichia coli NOT DETECTED NOT DETECTED Final   Klebsiella oxytoca NOT DETECTED NOT DETECTED Final   Klebsiella pneumoniae NOT DETECTED NOT DETECTED Final   Proteus species NOT DETECTED NOT DETECTED Final  Serratia marcescens NOT DETECTED NOT DETECTED Final   Haemophilus influenzae NOT DETECTED NOT DETECTED Final   Neisseria meningitidis NOT DETECTED NOT DETECTED Final   Pseudomonas aeruginosa NOT DETECTED NOT DETECTED Final   Candida albicans NOT DETECTED NOT DETECTED Final   Candida glabrata NOT DETECTED NOT DETECTED Final   Candida krusei NOT DETECTED NOT DETECTED Final   Candida parapsilosis NOT DETECTED NOT DETECTED Final   Candida tropicalis NOT DETECTED NOT DETECTED Final    Comment: Performed at [facility], [place], [place] [address]  SARS CORONAVIRUS 2 (TAT 6-24 HRS) Nasopharyngeal Nasopharyngeal Swab     Status: None   Collection Time: 02/09/19  2:54 PM   Specimen: Nasopharyngeal Swab  Result Value Ref Range Status   SARS Coronavirus 2 NEGATIVE NEGATIVE Final    Comment: (NOTE) SARS-CoV-2 target nucleic acids are NOT DETECTED. The SARS-CoV-2 RNA is generally detectable in upper and lower respiratory specimens during the acute phase of infection. Negative results do not preclude SARS-CoV-2 infection, do not rule out co-infections with other pathogens, and should not be used as the sole basis for treatment or other patient  management decisions. Negative results must be combined with clinical observations, patient history, and epidemiological information. The expected result is Negative. Fact Sheet for Patients: SugarRoll.be Fact Sheet for Healthcare Providers: https://www.woods-mathews.com/ This test is not yet approved or cleared by the [place] FDA and  has been authorized for detection and/or diagnosis of SARS-CoV-2 by FDA under an Emergency Use Authorization (EUA). This EUA will remain  in effect (meaning this test can be used) for the duration of the COVID-19 declaration under Section 56 4(b)(1) of the Act, 21 U.S.C. section 360bbb-3(b)(1), unless the authorization is terminated or revoked sooner. Performed at [facility], [place] [address], [place], [place] [address]   MRSA PCR Screening     Status: Abnormal   Collection Time: 02/10/19  5:03 PM   Specimen: Nasopharyngeal  Result Value Ref Range Status   MRSA by PCR POSITIVE (A) NEGATIVE Final    Comment:        The GeneXpert MRSA Assay (FDA approved for NASAL specimens only), is one component of a comprehensive MRSA colonization surveillance program. It is not intended to diagnose MRSA infection nor to guide or monitor treatment for MRSA infections. RESULT CALLED TO, READ BACK BY AND VERIFIED WITH: ALEKSEY FRASER @1839  02/10/19 MJU Performed at [facility], [address] [place], [place] [address]   Culture, blood (routine x 2)     Status: Abnormal   Collection Time: 02/11/19 12:31 PM   Specimen: BLOOD  Result Value Ref Range Status   Specimen Description   Final    BLOOD PORTA CATH Performed at [facility] [address], [place], [place] [address]    Special Requests   Final    BOTTLES DRAWN AEROBIC AND ANAEROBIC Blood Culture adequate volume Performed at [facility], [address] [place], [place] [address]    Culture  Setup Time   Final    GRAM  POSITIVE COCCI ANAEROBIC BOTTLE ONLY CRITICAL RESULT CALLED TO, READ BACK BY AND VERIFIED WITH: SCOTT HALL AT [id number] 02/12/2019 [facility]  Performed at [facility], [address] [place], [place] [address]    Culture (A)  Final    STAPHYLOCOCCUS AUREUS SUSCEPTIBILITIES PERFORMED ON PREVIOUS CULTURE WITHIN THE LAST 5 DAYS. Performed at [facility], [place] [address] [place], [place] [address]  Report Status 02/13/2019 FINAL  Final  Culture, blood (routine x 2)     Status: None   Collection Time: 02/11/19  3:57 PM   Specimen: BLOOD  Result Value Ref Range Status   Specimen Description BLOOD PORTA CATH  Final   Special Requests   Final    BOTTLES DRAWN AEROBIC AND ANAEROBIC Blood Culture adequate volume   Culture   Final    NO GROWTH 5 DAYS Performed at [facility], [address] [place], [place] [address]    Report Status 02/16/2019 FINAL  Final  CULTURE, BLOOD (ROUTINE X 2) w Reflex to ID Panel     Status: Abnormal   Collection Time: 02/13/19  2:18 PM   Specimen: BLOOD LEFT HAND  Result Value Ref Range Status   Specimen Description BLOOD LEFT HAND  Final   Special Requests   Final    BOTTLES DRAWN AEROBIC AND ANAEROBIC Blood Culture results may not be optimal due to an inadequate volume of blood received in culture bottles   Culture  Setup Time   Final    AEROBIC BOTTLE ONLY GRAM POSITIVE COCCI IN CLUSTERS CRITICAL RESULT CALLED TO, READ BACK BY AND VERIFIED WITH: Herbert Pun AT [id number] ON 02/14/2019 [place]. Performed at [facility], [place] [address], [place],  [address]    Culture METHICILLIN RESISTANT STAPHYLOCOCCUS AUREUS (A)  Final   Report Status 02/16/2019 FINAL  Final   Organism ID, Bacteria METHICILLIN RESISTANT STAPHYLOCOCCUS AUREUS  Final      Susceptibility   Methicillin resistant staphylococcus aureus - MIC*    CIPROFLOXACIN >=8 RESISTANT Resistant     ERYTHROMYCIN >=8 RESISTANT Resistant     GENTAMICIN <=0.5 SENSITIVE  Sensitive     OXACILLIN >=4 RESISTANT Resistant     TETRACYCLINE <=1 SENSITIVE Sensitive     VANCOMYCIN <=0.5 SENSITIVE Sensitive     TRIMETH/SULFA <=10 SENSITIVE Sensitive     CLINDAMYCIN RESISTANT Resistant     RIFAMPIN <=0.5 SENSITIVE Sensitive     Inducible Clindamycin POSITIVE Resistant     * METHICILLIN RESISTANT STAPHYLOCOCCUS AUREUS  Blood Culture ID Panel (Reflexed)     Status: Abnormal   Collection Time: 02/13/19  2:18 PM  Result Value Ref Range Status   Enterococcus species NOT DETECTED NOT DETECTED Final   Listeria monocytogenes NOT DETECTED NOT DETECTED Final   Staphylococcus species DETECTED (A) NOT DETECTED Final    Comment: CRITICAL RESULT CALLED TO, READ BACK BY AND VERIFIED WITH: ABBEY ELLINGTON AT [id number] ON 02/14/2019 [place].    Staphylococcus aureus (BCID) DETECTED (A) NOT DETECTED Final    Comment: Methicillin (oxacillin)-resistant Staphylococcus aureus (MRSA). MRSA is predictably resistant to beta-lactam antibiotics (except ceftaroline). Preferred therapy is vancomycin unless clinically contraindicated. Patient requires contact precautions if  hospitalized. CRITICAL RESULT CALLED TO, READ BACK BY AND VERIFIED WITH: Herbert Pun AT [id number] ON 02/14/2019 [place].    Methicillin resistance DETECTED (A) NOT DETECTED Final    Comment: CRITICAL RESULT CALLED TO, READ BACK BY AND VERIFIED WITH: Herbert Pun AT [id number] ON 02/14/2019 [place].    Streptococcus species NOT DETECTED NOT DETECTED Final   Streptococcus agalactiae NOT DETECTED NOT DETECTED Final   Streptococcus pneumoniae NOT DETECTED NOT DETECTED Final   Streptococcus pyogenes NOT DETECTED NOT DETECTED Final   Acinetobacter baumannii NOT DETECTED NOT DETECTED Final   Enterobacteriaceae species NOT DETECTED NOT DETECTED Final   Enterobacter cloacae complex NOT DETECTED NOT DETECTED Final   Escherichia coli NOT DETECTED NOT DETECTED Final  Klebsiella oxytoca NOT DETECTED NOT DETECTED Final   Klebsiella pneumoniae NOT  DETECTED NOT DETECTED Final   Proteus species NOT DETECTED NOT DETECTED Final   Serratia marcescens NOT DETECTED NOT DETECTED Final   Haemophilus influenzae NOT DETECTED NOT DETECTED Final   Neisseria meningitidis NOT DETECTED NOT DETECTED Final   Pseudomonas aeruginosa NOT DETECTED NOT DETECTED Final   Candida albicans NOT DETECTED NOT DETECTED Final   Candida glabrata NOT DETECTED NOT DETECTED Final   Candida krusei NOT DETECTED NOT DETECTED Final   Candida parapsilosis NOT DETECTED NOT DETECTED Final   Candida tropicalis NOT DETECTED NOT DETECTED Final    Comment: Performed at [facility], [address] [place], [place] [address]  CULTURE, BLOOD (ROUTINE X 2) w Reflex to ID Panel     Status: None   Collection Time: 02/13/19  3:09 PM   Specimen: BLOOD  Result Value Ref Range Status   Specimen Description BLOOD BLOOD LEFT HAND  Final   Special Requests   Final    BOTTLES DRAWN AEROBIC AND ANAEROBIC Blood Culture adequate volume   Culture   Final    NO GROWTH 5 DAYS Performed at [facility], [address], [place], [place] [address]    Report Status 02/18/2019 FINAL  Final  Culture, blood (routine x 2)     Status: Abnormal   Collection Time: 02/15/19 12:49 PM   Specimen: BLOOD  Result Value Ref Range Status   Specimen Description   Final    BLOOD LT HAND Performed at [facility], [address] [place], [place] [address]    Special Requests   Final    BOTTLES DRAWN AEROBIC AND ANAEROBIC Blood Culture results may not be optimal due to an inadequate volume of blood received in culture bottles Performed at [facility], [address] [place], [place] [address]    Culture  Setup Time   Final    GRAM POSITIVE COCCI IN BOTH AEROBIC AND ANAEROBIC BOTTLES CRITICAL RESULT CALLED TO, READ BACK BY AND VERIFIED WITHEleonore Chiquito AT [id number] 02/16/2019 S[place] Performed at [facility], [address] [place], [place] [address]     Culture (A)  Final    METHICILLIN RESISTANT STAPHYLOCOCCUS AUREUS STAPHYLOCOCCUS SPECIES (COAGULASE NEGATIVE) THE SIGNIFICANCE OF ISOLATING THIS ORGANISM FROM A SINGLE SET OF BLOOD CULTURES WHEN MULTIPLE SETS ARE DRAWN IS UNCERTAIN. PLEASE NOTIFY THE MICROBIOLOGY DEPARTMENT WITHIN ONE WEEK IF SPECIATION AND SENSITIVITIES ARE REQUIRED. Performed at [facility], [place] [address], [place], [place] [address]    Report Status 02/18/2019 FINAL  Final   Organism ID, Bacteria METHICILLIN RESISTANT STAPHYLOCOCCUS AUREUS  Final      Susceptibility   Methicillin resistant staphylococcus aureus - MIC*    CIPROFLOXACIN >=8 RESISTANT Resistant     ERYTHROMYCIN >=8 RESISTANT Resistant     GENTAMICIN <=0.5 SENSITIVE Sensitive     OXACILLIN >=4 RESISTANT Resistant     TETRACYCLINE <=1 SENSITIVE Sensitive     VANCOMYCIN <=0.5 SENSITIVE Sensitive     TRIMETH/SULFA <=10 SENSITIVE Sensitive     CLINDAMYCIN RESISTANT Resistant     RIFAMPIN <=0.5 SENSITIVE Sensitive     Inducible Clindamycin POSITIVE Resistant     * METHICILLIN RESISTANT STAPHYLOCOCCUS AUREUS  Aerobic Culture (superficial specimen)     Status: None   Collection Time: 02/17/19  1:31 PM   Specimen: Heel; Wound  Result Value Ref Range Status   Specimen Description   Final    HEEL Performed  at [facility], [address] [place], [place] [address]    Special Requests   Final    NONE Performed at [facility], [address] [place], [place] [address]    Gram Stain   Final    NO WBC SEEN MODERATE GRAM POSITIVE COCCI IN PAIRS    Culture   Final    ABUNDANT METHICILLIN RESISTANT STAPHYLOCOCCUS AUREUS ABUNDANT DIPHTHEROIDS(CORYNEBACTERIUM SPECIES) Standardized susceptibility testing for this organism is not available. Performed at [facility], [place] [address] [place], [place] [address]    Report Status 02/19/2019 FINAL  Final   Organism ID, Bacteria METHICILLIN RESISTANT STAPHYLOCOCCUS AUREUS  Final       Susceptibility   Methicillin resistant staphylococcus aureus - MIC*    CIPROFLOXACIN >=8 RESISTANT Resistant     ERYTHROMYCIN >=8 RESISTANT Resistant     GENTAMICIN <=0.5 SENSITIVE Sensitive     OXACILLIN >=4 RESISTANT Resistant     TETRACYCLINE <=1 SENSITIVE Sensitive     VANCOMYCIN 1 SENSITIVE Sensitive     TRIMETH/SULFA <=10 SENSITIVE Sensitive     CLINDAMYCIN RESISTANT Resistant     RIFAMPIN <=0.5 SENSITIVE Sensitive     Inducible Clindamycin POSITIVE Resistant     * ABUNDANT METHICILLIN RESISTANT STAPHYLOCOCCUS AUREUS  CULTURE, BLOOD (ROUTINE X 2) w Reflex to ID Panel     Status: None   Collection Time: 02/18/19 11:29 PM   Specimen: BLOOD  Result Value Ref Range Status   Specimen Description BLOOD LEFT ARM  Final   Special Requests   Final    BOTTLES DRAWN AEROBIC AND ANAEROBIC Blood Culture adequate volume   Culture   Final    NO GROWTH 5 DAYS Performed at [facility], [address] [place], [place] [address]    Report Status 02/23/2019 FINAL  Final  CULTURE, BLOOD (ROUTINE X 2) w Reflex to ID Panel     Status: None   Collection Time: 02/18/19 11:29 PM   Specimen: BLOOD  Result Value Ref Range Status   Specimen Description BLOOD LEFT ARM  Final   Special Requests   Final    BOTTLES DRAWN AEROBIC AND ANAEROBIC Blood Culture adequate volume   Culture   Final    NO GROWTH 5 DAYS Performed at [facility], [address] [place], [place] [address]    Report Status 02/23/2019 FINAL  Final    Coagulation Studies: No results for input(s): LABPROT, INR in the last 72 hours.  Urinalysis: No results for input(s): COLORURINE, LABSPEC, PHURINE, GLUCOSEU, HGBUR, BILIRUBINUR, KETONESUR, PROTEINUR, UROBILINOGEN, NITRITE, LEUKOCYTESUR in the last 72 hours.  Invalid input(s): APPERANCEUR    Imaging: DG Chest Port 1 View  Result Date: 03/01/2019 CLINICAL DATA:  Dyspnea. EXAM: PORTABLE CHEST 1 VIEW COMPARISON:  02/09/2019 FINDINGS: Enteric tube  courses through the region of the stomach and into the region of the duodenum in the right abdomen as tip is not definitely visualized. Left IJ dialysis catheter has tip over the SVC. Lungs are adequately inflated and demonstrate subtle hazy prominence of the perihilar markings likely mild vascular congestion unchanged. Stable small amount left pleural fluid likely with associated left basilar atelectasis. Mild stable cardiomegaly. Remainder of the exam is unchanged. IMPRESSION: 1. Mild stable vascular congestion. Stable small left pleural effusion likely with associated basilar atelectasis. 2.  Tubes and lines as described. Electronically Signed   By: Marin Olp M.D.   On: 03/01/2019 06:58     Medications:   . sodium chloride  10 mL/hr at 03/01/19 0325  . sodium chloride    . DAPTOmycin (CUBICIN)  IV Stopped (02/28/19 2242)  . feeding supplement (OSMOLITE 1.5 CAL) 1,000 mL (03/01/19 1139)  . heparin 1,100 Units/hr (03/01/19 0404)  . potassium PHOSPHATE IVPB (in mmol) 20 mmol (03/01/19 1146)  . vancomycin     . aspirin  81 mg Oral Daily  . B-complex with vitamin C  1 tablet Per Tube Daily  . chlorhexidine  15 mL Mouth Rinse BID  . Chlorhexidine Gluconate Cloth  6 each Topical Q0600  . epoetin (EPOGEN/PROCRIT) injection  4,000 Units Intravenous Q M,W,F-HD  . feeding supplement (PRO-STAT SUGAR FREE 64)  30 mL Per Tube Daily  . fluticasone  2 spray Each Nare Daily  . free water  30 mL Per Tube Q4H  . insulin aspart  0-15 Units Subcutaneous TID WC  . insulin glargine  10 Units Subcutaneous Daily  . levothyroxine  88 mcg Oral QAC breakfast  . mouth rinse  15 mL Mouth Rinse q12n4p  . midodrine  10 mg Oral TID WC  . pantoprazole (PROTONIX) IV  40 mg Intravenous Q24H  . venlafaxine  75 mg Oral BID WC  . zinc sulfate  220 mg Oral Daily   sodium chloride, sodium chloride, haloperidol lactate, ondansetron (ZOFRAN) IV  Assessment/ Plan:  [name] is a 63 y.o. white female with  end-stage renal disease on hemodialysis, atrial fibrillation, chronic diastolic congestive heart failure, coronary artery disease status post CABG, diabetes mellitus type II, hypertension, hyperlipidemia, hypothyroidism, Covid positive in July 2020, recent nursing home stay in [place] Prolonged hospitalization for MRSA bacteremia/sepsis and now with acute encephalopathy  [facility] MWF [facility] [address] Left AVG 81.5kg  1. End Stage Renal Disease: on hemodialysis. With complication of dialysis device. Angiogram on 12/21 shows no communication with bleed to AVG.  Placed tunneled catheter until her AVG site has healed. -  MWF schedule.   2. Hypotension - midodrine  3. Anemia of chronic kidney disease:   Status post PRBC transfusion on 12/22 - EPO with HD treatments.   4. Secondary Hyperparathyroidism: not currently on binders.   5. Acute Encephalopathy: improving off ceftaroline.  - Appreciate neurology input.   Overall prognosis remains poor. Appreciate palliative care input. Unclear if patient will recover at this point.    LOS: [place] 12/27/20201:00 PM

## 2019-03-02 DIAGNOSIS — Z4659 Encounter for fitting and adjustment of other gastrointestinal appliance and device: Secondary | ICD-10-CM

## 2019-03-02 DIAGNOSIS — I482 Chronic atrial fibrillation, unspecified: Secondary | ICD-10-CM

## 2019-03-02 LAB — BASIC METABOLIC PANEL
Anion gap: 16 — ABNORMAL HIGH (ref 5–15)
BUN: 28 mg/dL — ABNORMAL HIGH (ref 8–23)
CO2: 22 mmol/L (ref 22–32)
Calcium: 8.3 mg/dL — ABNORMAL LOW (ref 8.9–10.3)
Chloride: 96 mmol/L — ABNORMAL LOW (ref 98–111)
Creatinine, Ser: 3.61 mg/dL — ABNORMAL HIGH (ref 0.44–1.00)
GFR calc Af Amer: 15 mL/min — ABNORMAL LOW (ref >60)
GFR calc non Af Amer: 13 mL/min — ABNORMAL LOW (ref >60)
Glucose, Bld: 130 mg/dL — ABNORMAL HIGH (ref 70–99)
Potassium: 4.4 mmol/L (ref 3.5–5.1)
Sodium: 134 mmol/L — ABNORMAL LOW (ref 135–145)

## 2019-03-02 LAB — GLUCOSE, CAPILLARY
Glucose-Capillary: 130 mg/dL — ABNORMAL HIGH (ref 70–99)
Glucose-Capillary: 142 mg/dL — ABNORMAL HIGH (ref 70–99)
Glucose-Capillary: 149 mg/dL — ABNORMAL HIGH (ref 70–99)
Glucose-Capillary: 164 mg/dL — ABNORMAL HIGH (ref 70–99)

## 2019-03-02 LAB — VANCOMYCIN, RANDOM: Vancomycin Rm: 19

## 2019-03-02 LAB — HEPARIN LEVEL (UNFRACTIONATED): Heparin Unfractionated: 0.49 IU/mL (ref 0.30–0.70)

## 2019-03-02 LAB — CBC
HCT: 24.1 % — ABNORMAL LOW (ref 36.0–46.0)
Hemoglobin: 8.2 g/dL — ABNORMAL LOW (ref 12.0–15.0)
MCH: 29.7 pg (ref 26.0–34.0)
MCHC: 34 g/dL (ref 30.0–36.0)
MCV: 87.3 fL (ref 80.0–100.0)
Platelets: 197 10*3/uL (ref 150–400)
RBC: 2.76 MIL/uL — ABNORMAL LOW (ref 3.87–5.11)
RDW: 21.1 % — ABNORMAL HIGH (ref 11.5–15.5)
WBC: 10.4 10*3/uL (ref 4.0–10.5)
nRBC: 0 % (ref 0.0–0.2)

## 2019-03-02 LAB — MAGNESIUM: Magnesium: 1.9 mg/dL (ref 1.7–2.4)

## 2019-03-02 LAB — CK: Total CK: 31 U/L — ABNORMAL LOW (ref 38–234)

## 2019-03-02 LAB — PHOSPHORUS: Phosphorus: 2.9 mg/dL (ref 2.5–4.6)

## 2019-03-02 NOTE — Progress Notes (Signed)
Nutrition Follow-up  DOCUMENTATION CODES:   Obesity unspecified  INTERVENTION:   Continue Osmolite 1.5 '@50ml'$ /hr + prostat liquid protein 30 ml daily via tube, supplement provides 100 kcal, 15 grams protein.  Free water flushes 30m q4 hours to maintain tube patency   Regimen provides 1900kcal/day, 90g/day protein, 10971mday free water.   B-complex with C daily via tube  NUTRITION DIAGNOSIS:   Increased nutrient needs related to chronic illness(ESRD on HD) as evidenced by estimated needs. Ongoing.  GOAL:   Patient will meet greater than or equal to 90% of their needs -met with tube feeds   MONITOR:   Labs, Weight trends, TF tolerance, Skin, I & O's  ASSESSMENT:   [age].o. female with end-stage renal disease on HD, atrial fibrillation, chronic diastolic congestive heart failure, CAD status post PCI, CABG,  type 2 diabetes, hypertension, hyperlipidemia, hypothyroidism, Covid positive in July 2020 admitted with AMS and found to have MRSA positive blood cultures   Pt tolerating tube feeds well at goal rate via NGT. Pt is a little more awake. Pt seen by SLP 12/26 who recommended continued NPO. Pt is having some diarrhea. Refeed labs stable. Pt's weight is down ~9lbs since admit but hopefully will stabilize now with addition of tube feeds; RD will continue to monitor.   Medications reviewed and include: aspirin, B complex with C, epogen, heparin, insulin, synthroid, protonix, zinc, daptomycin, vancomycin   Labs reviewed: Na 134(L), K 4.4 wnl, BUN 28(H), creat 3.61(H), P 2.9 wnl, Mg 1.9 wnl Hgb 8.2(L), Hct 24.1(L) cbgs- 243, 182, 161, 130, 142 x 24 hrs  Diet Order:   Diet Order            Diet NPO time specified  Diet effective now             EDUCATION NEEDS:   Not appropriate for education at this time  Skin:  Skin Assessment: Skin Integrity Issues:(stg II right buttocks; DTI right heel; unstageable left heel)  Last BM:  12/27- type 7  Height:   Ht Readings from  Last 1 Encounters:  02/23/19 '5\' 4"'$  (1.626 m)   Weight:   Wt Readings from Last 1 Encounters:  03/02/19 75.5 kg   Ideal Body Weight:  45 kg  BMI:  Body mass index is 28.57 kg/m.  Estimated Nutritional Needs:   Kcal:  1700-1900kcal/day  Protein:  85-95g/day  Fluid:  UOP +1L  CaKoleen DistanceS, RD, LDN Pager #- [phone]- [phone] Hours Pager: [phone]

## 2019-03-02 NOTE — Progress Notes (Signed)
This note also relates to the following rows which could not be included: Pulse Rate - Cannot attach notes to unvalidated device data Resp - Cannot attach notes to unvalidated device data BP - Cannot attach notes to unvalidated device data  Hd completed  

## 2019-03-02 NOTE — Consult Note (Signed)
ANTICOAGULATION CONSULT NOTE  Pharmacy Consult for heparin Indication: DVT  Patient Measurements: Height: 5\' 4"  (162.6 cm) Weight: 161 lb 6 oz (73.2 kg) IBW/kg (Calculated) : 54.7 Heparin Dosing Weight: 71 kg  Vital Signs: Temp: 98.6 F (37 C) (12/27 2046) Temp Source: Oral (12/27 2046) BP: 116/46 (12/27 2046) Pulse Rate: 70 (12/27 2046)  Labs: Recent Labs    02/28/19 0626 02/28/19 2202 03/01/19 0429 03/02/19 0329  HGB 8.6*  --  8.6* 8.2*  HCT 25.3*  --  25.0* 24.1*  PLT 182  --  184 197  HEPARINUNFRC 0.16* 0.51 0.50 0.49  CREATININE 2.46*  --  3.03*  --    Estimated Creatinine Clearance: 18.1 mL/min (A) (by C-G formula based on SCr of 3.03 mg/dL (H)).  Medical History: Past Medical History:  Diagnosis Date  . Anemia   . Chronic diastolic CHF (congestive heart failure) ([place])    a. Due to ischemic cardiomyopathy. EF as low as 35%, improved to normal s/p CABG; b. echo 07/06/13: EF 55-60%, no RWMAs, mod TR, trivial pericardial effusion not c/w tamponade physiology;  c. 10/2015 Echo: EF 65%, Gr1 DD, triv AI, mild MR, mildly dil LA, mod TR, PASP 75mmHg.  Marland Kitchen Coronary artery disease    a. NSTEMI 06/2013; b.cath: severe three-vessel CAD w/ EF 30% & mild-mod MR; c. s/p 3 vessel CABG 07/02/13 (LIMA-LAD, SVG-OM, and SVG-RPDA);  d. 10/2015 MV: no ischemia/infarct.  . Diabetes mellitus without complication ([place])   . Diabetic neuropathy ([place])   . Dialysis patient [facility])    MWF  . ESRD (end stage renal disease) ([place])    a. 12/2015 initiated - mwf dialysis.  Marland Kitchen GERD (gastroesophageal reflux disease)   . Hyperlipidemia   . Hypertension   . Hypothyroidism   . Myocardial infarction ([place]) 2015  . Neuropathy   . Pleural effusion 2015  . Pulmonary hypertension ([place])   . Renal insufficiency   . Wears dentures    full lower    Medications:  Scheduled:  . aspirin  81 mg Oral Daily  . B-complex with vitamin C  1 tablet Per Tube Daily  . chlorhexidine  15 mL Mouth Rinse BID  .  Chlorhexidine Gluconate Cloth  6 each Topical Q0600  . epoetin (EPOGEN/PROCRIT) injection  4,000 Units Intravenous Q M,W,F-HD  . feeding supplement (PRO-STAT SUGAR FREE 64)  30 mL Per Tube Daily  . fluticasone  2 spray Each Nare Daily  . free water  30 mL Per Tube Q4H  . insulin aspart  0-15 Units Subcutaneous Q4H  . insulin aspart  3 Units Subcutaneous Q4H  . insulin glargine  10 Units Subcutaneous Daily  . levothyroxine  88 mcg Oral QAC breakfast  . mouth rinse  15 mL Mouth Rinse q12n4p  . midodrine  10 mg Oral TID WC  . pantoprazole (PROTONIX) IV  40 mg Intravenous Q24H  . venlafaxine  75 mg Oral BID WC  . zinc sulfate  220 mg Oral Daily    Assessment: 63 y.o. female admitted on 02/09/2019 with MRSA  bacteremia. She was on apixaban PTA but last dose was prior to admission (12/7). [place] 12/15 revealed age-indeterminate short segment occlusive DVT involving the mid humeral aspect of one of the paired brachial veins.  Heparin was started but after repeated attempts by multiple phlebotomists lab was unable to draw labs required for heparin monitoring. Additionally, she was too confused and disoriented to swallow oral medications. On 12/20 a femoral central line was placed. Heparin was stopped  this morning due to a fall and is being restarted upon returning from HD  Heparin Course: 12/24 0231 HL 0.26: held due to unwitnessed fall and bleeding from arm 12/25 0413 HL 0.28: subtherapeutic - CBC stable.  Will increase Heparin to 900 units/hr and recheck in 8 hours. 12/25 1356 HL 0.47: therapeutic  12/25 2020 HL 0.36: therapeutic x 2 12/26 HL @0626 = 0.16. Will give Heparin bolus of 2100 units and increase heparin drip to 1100 units/hr  Goal of Therapy:  Heparin Level: 0.3 - 0.7 units/mL Monitor platelets by anticoagulation protocol: Yes   Plan:  12/26 HL @1313 = 0.54 12/26 HL @2202 = 0.51, therapeutic x 2 12/27 HL @0429 = 0.50, therapeutic x 3.  CBC stable 12/28 HL @0329 = 0.49, therapeutic x 4.   CBC stable.    Heparin level therapeutic  Will continue current heparin drip at 1100 units/hr  CBC & HL in am  Ena Dawley, PharmD Clinical Pharmacist 03/02/2019 5:32 AM

## 2019-03-02 NOTE — Care Management (Signed)
Message left for patient's husband [name] [phone] with names of [facility] and [facility]. I have provided my callback number which will be forwarded to [facility] after hours.TOC to follow. Referral has not been made to either facility as husband has not agreed; [facility] will have a potential dialysis bed available as early as tomorrow; Administrator, [facility] does not have any beds at this time.

## 2019-03-02 NOTE — Progress Notes (Signed)
ID No change in her status Somnolent No distress Non verbal  BP (!) 122/44 (BP Location: Right Leg)   Pulse 78   Temp 99.5 F (37.5 C) (Axillary)   Resp 16   Ht 5\' 4"  (1.626 m)   Wt 75.5 kg   SpO2 100%   BMI 28.57 kg/m    Left IJ dialysis catheter Rt AV fistula Chest b/l air entry NG tube Abd soft CNS cannot be assessed  Labs CBC Latest Ref Rng & Units 03/02/2019 03/01/2019 02/28/2019  WBC 4.0 - 10.5 K/uL 10.4 8.3 6.6  Hemoglobin 12.0 - 15.0 g/dL 8.2(L) 8.6(L) 8.6(L)  Hematocrit 36.0 - 46.0 % 24.1(L) 25.0(L) 25.3(L)  Platelets 150 - 400 K/uL 197 184 182    CMP Latest Ref Rng & Units 03/02/2019 03/01/2019 03/01/2019  Glucose 70 - 99 mg/dL 130(H) - 256(H)  BUN 8 - 23 mg/dL 28(H) - 21  Creatinine 0.44 - 1.00 mg/dL 3.61(H) - 3.03(H)  Sodium 135 - 145 mmol/L 134(L) - 135  Potassium 3.5 - 5.1 mmol/L 4.4 4.2 4.1  Chloride 98 - 111 mmol/L 96(L) - 96(L)  CO2 22 - 32 mmol/L 22 - 26  Calcium 8.9 - 10.3 mg/dL 8.3(L) - 8.2(L)  Total Protein 6.5 - 8.1 g/dL - - 4.6(L)  Total Bilirubin 0.3 - 1.2 mg/dL - - 0.9  Alkaline Phos 38 - 126 U/L - - 58  AST 15 - 41 U/L - - 22  ALT 0 - 44 U/L - - 17    Micro 12/7, 12/9, 12/11, 12/13 BC positive for MRSA 02/18/19 BC NG  Impression/recommendation  MRSA bacteremia- after being persistently bacteremic for a week- since 12/16 blood culture neg- was on ceftaroline and dapto-  ceftaroline discontinued on 12/22 to see whether encephalopathy would resolve- but it did not -  The source of MRSA was not known  AV fistula with stents was a concern ( spine imaging Ok, TEE could not be done)- the AV fistula was cannulated on 02/23/19 because of a malfunctioning rt brachial artery.High risk for breakthru bacteremia Currently on dapto and vanco- Will DC dapo and continue vanco as I dont think it was vanco failure because MIC has been persistently 1/0.5  I would like to add rifampin but she was on amiodarone and eliquis until this hospitalization- As  she could not take PO she is on  Heparin. Planning for PEG tomorrow   Persistent encephalopathy- unclear etiology- her mental status has fluctuated with her talking on some days But of late none She also has not had any nutrition in many days Could that be a reason? No improvement with NG tube , no improvement with DC ceftaroline  Could she have autoimmune encephalitis like Lga1 ( as she had  tremors on her jaw and rt hand)- less likely Neurologist has seen her in the past- They do not think LP would add any info.   Paroxsymal Afib- now in Sinus rhythm  Left arm DVT-  Post covid morbidity- Long COVID  Multiple comorbidities and minimal chance of a meaningful recovery.  Discussed the management with care team and pharmacist.

## 2019-03-02 NOTE — Progress Notes (Signed)
This note also relates to the following rows which could not be included: Pulse Rate - Cannot attach notes to unvalidated device data Resp - Cannot attach notes to unvalidated device data  Hd started  

## 2019-03-02 NOTE — Consult Note (Signed)
Vonda Antigua, MD [address] [place], [place], [place], [address] [address], [place], [place], [place], [address] Phone: [phone]  Fax: [phone]  Consultation  Referring Provider:     Dr. Earleen Newport Primary Care Physician:  [name], MD Reason for Consultation:     PEG tube placement  Date of Admission:  02/09/2019 Date of Consultation:  03/02/2019         HPI:   Jamie Arias is a 63 y.o. female who lives at a skilled nursing facility, presents with altered mental status, with a prolonged hospital course of MRSA bacteremia.  GI being consulted due to need for feeding tube placement as patient unable to maintain adequate nutrition orally.  Patient currently has NG tube in place.  Past Medical History:  Diagnosis Date  . Anemia   . Chronic diastolic CHF (congestive heart failure) ([place])    a. Due to ischemic cardiomyopathy. EF as low as 35%, improved to normal s/p CABG; b. echo 07/06/13: EF 55-60%, no RWMAs, mod TR, trivial pericardial effusion not c/w tamponade physiology;  c. 10/2015 Echo: EF 65%, Gr1 DD, triv AI, mild MR, mildly dil LA, mod TR, PASP 41mHg.  .Marland KitchenCoronary artery disease    a. NSTEMI 06/2013; b.cath: severe three-vessel CAD w/ EF 30% & mild-mod MR; c. s/p 3 vessel CABG 07/02/13 (LIMA-LAD, SVG-OM, and SVG-RPDA);  d. 10/2015 MV: no ischemia/infarct.  . Diabetes mellitus without complication ([place]   . Diabetic neuropathy ([place]   . D[facility] patient [facility]    MWF  . ESRD (end stage renal disease) ([place]    a. 12/2015 initiated - mwf dialysis.  .Marland KitchenGERD (gastroesophageal reflux disease)   . Hyperlipidemia   . Hypertension   . Hypothyroidism   . Myocardial infarction ([place] 2015  . Neuropathy   . Pleural effusion 2015  . Pulmonary hypertension ([place]   . Renal insufficiency   . Wears dentures    full lower    Past Surgical History:  Procedure Laterality Date  . A/V FISTULAGRAM Right 12/17/2016   Procedure: A/V Fistulagram;  Surgeon: DAlgernon Huxley MD;  [facility];  Service: Cardiovascular;  Laterality: Right;  . A/V FISTULAGRAM Right 01/07/2017   Procedure: A/V Fistulagram;  Surgeon: DAlgernon Huxley MD;  [facility];  Service: Cardiovascular;  Laterality: Right;  . A/V FISTULAGRAM Right 12/03/2017   Procedure: A/V FISTULAGRAM;  Surgeon: SKatha Cabal MD;  [facility];  Service: Cardiovascular;  Laterality: Right;  . A/V FISTULAGRAM Right 02/23/2019   Procedure: A/V Fistulagram;  Surgeon: DAlgernon Huxley MD;  [facility];  Service: Cardiovascular;  Laterality: Right;  . A/V SHUNT INTERVENTION N/A 02/22/2017   Procedure: A/V SHUNT INTERVENTION;  Surgeon: DAlgernon Huxley MD;  [facility];  Service: Cardiovascular;  Laterality: N/A;  . AV FISTULA PLACEMENT Right 02/03/2016   Procedure: INSERTION OF ARTERIOVENOUS (AV) GORE-TEX GRAFT ARM ( BRACH / AXILLARY );  Surgeon: GKatha Cabal MD;  Location: [facility];  Service: Vascular;  Laterality: Right;  . CARDIAC CATHETERIZATION    . CATARACT EXTRACTION Bilateral   . CHOLECYSTECTOMY N/A 12/09/2014   Procedure: LAPAROSCOPIC CHOLECYSTECTOMY;  Surgeon: CMarlyce [name] MD;  Location: [facility];  Service: General;  Laterality: N/A;  . CORONARY ARTERY BYPASS GRAFT N/A 07/02/2013   Procedure: CORONARY ARTERY BYPASS GRAFTING (CABG);  Surgeon: PIvin [name] MD;  [place] [facility]  Service: Open Heart  Surgery;  Laterality: N/A;  CABG x three, using left internal mammary artery and right leg greater saphenous vein harvested endoscopically  . ESOPHAGOGASTRODUODENOSCOPY (EGD) WITH PROPOFOL N/A 11/24/2015   Procedure: ESOPHAGOGASTRODUODENOSCOPY (EGD) WITH PROPOFOL;  Surgeon: [name], MD;  Location: [facility];  Service: Endoscopy;  Laterality: N/A;  Diabetic - insulin  . EYE SURGERY Bilateral    Cataract Extraction with IOL  . INTRAOPERATIVE TRANSESOPHAGEAL ECHOCARDIOGRAM N/A 07/02/2013    Procedure: INTRAOPERATIVE TRANSESOPHAGEAL ECHOCARDIOGRAM;  Surgeon: Ivin [name], MD;  Location: [facility];  Service: Open Heart Surgery;  Laterality: N/A;  . PERIPHERAL VASCULAR CATHETERIZATION Right 12/06/2015   Procedure: Dialysis/Perma Catheter Insertion;  Surgeon: Katha Cabal, MD;  Location: [place] [facility];  Service: Cardiovascular;  Laterality: Right;  . PERIPHERAL VASCULAR THROMBECTOMY Right 09/28/2016   Procedure: Peripheral Vascular Thrombectomy;  Surgeon: Katha Cabal, MD;  Location: [place] [facility];  Service: Cardiovascular;  Laterality: Right;  . PERIPHERAL VASCULAR THROMBECTOMY Right 05/20/2018   Procedure: PERIPHERAL VASCULAR THROMBECTOMY;  Surgeon: Katha Cabal, MD;  Location: [facility] CV LAB;  Service: Cardiovascular;  Laterality: Right;  . PORTA CATH REMOVAL N/A 06/01/2016   Procedure: Glori [name] Cath Removal;  Surgeon: Katha Cabal, MD;  Location: [place] [facility];  Service: Cardiovascular;  Laterality: N/A;  . THORACENTESIS Left 2015    Prior to Admission medications   Medication Sig Start Date End Date Taking? Authorizing Provider  ACCU-CHEK FASTCLIX LANCETS MISC Check blood glucose twice daily, diagnosis code E11.9 12/13/16  Yes [name], MD  acetaminophen (TYLENOL) 325 MG tablet Take 650 mg by mouth daily as needed for moderate pain or headache.    Yes [provider]  Alirocumab (PRALUENT) 75 MG/ML SOAJ Inject 75 mg into the skin every 14 (fourteen) days. 04/04/18  Yes Wellington Hampshire, MD  Amino Acids-Protein Hydrolys (FEEDING SUPPLEMENT, PRO-STAT SUGAR FREE 64,) LIQD Take 30 mLs by mouth 3 (three) times daily between meals. 09/09/18  Yes Hosie Poisson, MD  amiodarone (PACERONE) 200 MG tablet Take 200 mg by mouth daily.   Yes [provider]  aspirin EC 81 MG tablet Take 81 mg by mouth daily.   Yes [provider]  atorvastatin (LIPITOR) 40 MG tablet Take 40 mg by mouth daily.   Yes [provider]  Blood Glucose Monitoring Suppl (ACCU-CHEK AVIVA PLUS) w/Device KIT Use as directed to test blood sugar up to three times daily 05/06/17  Yes [name], MD  Calcium Acetate 667 MG TABS Take 2,001 mg by mouth 3 (three) times daily with meals.   Yes [provider]  ELIQUIS 5 MG TABS tablet Take 5 mg by mouth 2 (two) times daily.  01/20/19  Yes [provider]  ferrous sulfate 325 (65 FE) MG tablet Take 325 mg by mouth daily with breakfast.   Yes [provider]  FLUoxetine (PROZAC) 20 MG capsule Take 20 mg by mouth daily.   Yes [provider]  fluticasone (FLONASE) 50 MCG/ACT nasal spray Place 2 sprays into both nostrils daily. Patient taking differently: Place 2 sprays into both nostrils daily as needed for allergies or rhinitis.  09/17/17  Yes [name], MD  furosemide (LASIX) 20 MG tablet Take 40 mg by mouth every other day. Non-dialysis days    Yes [provider]  gabapentin (NEURONTIN) 300 MG capsule Take 300 mg by mouth 2 (two) times daily.  01/20/19  Yes [provider]  glucose blood (ACCU-CHEK  AVIVA) test strip Use as instructed to test blood sugar up to 3 times daily 05/06/17  Yes [name], MD  Insulin Glargine (BASAGLAR KWIKPEN) 100 UNIT/ML SOPN Inject 18 Units into the skin daily.   Yes [provider]  Lancets (ACCU-CHEK SOFT TOUCH) lancets Use as instructed 05/06/17  Yes [name], MD  levothyroxine (SYNTHROID) 88 MCG tablet Take 88 mcg by mouth daily before breakfast.   Yes [provider]  lidocaine-prilocaine (EMLA) cream Apply 1 application topically as needed (port access).   Yes [provider]  midodrine (PROAMATINE) 10 MG tablet Take 1 tablet (10 mg total) by mouth 3 (three) times daily with meals. Patient taking differently: Take 10 mg by mouth 3 (three) times daily as needed (low BP).  09/09/18  Yes Hosie Poisson, MD  multivitamin (RENA-VIT) TABS tablet Take 1  tablet by mouth at bedtime. 09/09/18  Yes Hosie Poisson, MD  nitroGLYCERIN (NITROSTAT) 0.4 MG SL tablet DISSOLVE ONE TABLET UNDER THE TONGUE EVERY 5 MINUTES AS NEEDED FOR CHEST PAIN.  DO NOT EXCEED A TOTAL OF 3 DOSES IN 15 MINUTES Patient taking differently: Place 0.4 mg under the tongue every 5 (five) minutes as needed for chest pain. DISSOLVE ONE TABLET UNDER THE TONGUE EVERY 5 MINUTES AS NEEDED FOR CHEST PAIN.  DO NOT EXCEED A TOTAL OF 3 DOSES IN 15 MINUTES 08/18/18  Yes Wellington Hampshire, MD  ondansetron (ZOFRAN-ODT) 4 MG disintegrating tablet Take 1 tablet (4 mg total) by mouth every 8 (eight) hours as needed for nausea or vomiting. appt with PCP further refills Dr. Caryl Bis 01/28/19  Yes McLean-Scocuzza, Nino Glow, MD  pantoprazole (PROTONIX) 40 MG tablet Take 40 mg by mouth daily. 01/20/19  Yes [provider]  vitamin C (VITAMIN C) 500 MG tablet Take 1 tablet (500 mg total) by mouth daily. 09/10/18  Yes Hosie Poisson, MD  zinc sulfate 220 (50 Zn) MG capsule Take 1 capsule (220 mg total) by mouth daily. 09/10/18  Yes Hosie Poisson, MD  mirtazapine (REMERON) 15 MG tablet Take 1 tablet (15 mg total) by mouth at bedtime. Patient not taking: Reported on 02/09/2019 09/11/18   Mercy Riding, MD    Family History  Problem Relation Age of Onset  . COPD Mother   . Cancer Mother        Lung  . Pulmonary embolism Father   . Diabetes Father   . Diabetes Paternal Grandfather   . Heart disease Maternal Grandmother   . Colon cancer Neg Hx   . Colon polyps Neg Hx   . Esophageal cancer Neg Hx   . Pancreatic cancer Neg Hx   . Liver disease Neg Hx      Social History   Tobacco Use  . Smoking status: Never Smoker  . Smokeless tobacco: Never Used  Substance Use Topics  . Alcohol use: No    Alcohol/week: 0.0 standard drinks  . Drug use: No    Allergies as of 02/09/2019 - Review Complete 02/09/2019  Allergen Reaction Noted  . Nsaids Other (See Comments) 04/03/2016  . Doxycycline Other (See  Comments) 05/14/2017    Review of Systems:    All systems reviewed and negative except where noted in HPI.   Physical Exam:  Vital signs in last 24 hours: Vitals:   03/02/19 1200 03/02/19 1215 03/02/19 1230 03/02/19 1331  BP: (!) 96/59 (!) 109/47 (!) 124/49 (!) 122/44  Pulse: 81 80 85 78  Resp: (!) 25 (!) 22 19 16  Temp:   99.2 F (37.3 C) 99.5 F (37.5 C)  TempSrc:   Oral Axillary  SpO2:    100%  Weight:      Height:       Last BM Date: 03/01/19 General:   Pleasant, cooperative in NAD Head:  Normocephalic and atraumatic. Eyes:   No icterus.   Conjunctiva pink. PERRLA. Ears:  Normal auditory acuity. Neck:  Supple; no masses or thyroidomegaly Lungs: Respirations even and unlabored. Lungs clear to auscultation bilaterally.   No wheezes, crackles, or rhonchi.  Abdomen:  Soft, nondistended, nontender. Normal bowel sounds. No appreciable masses or hepatomegaly.  No rebound or guarding.  Neurologic:  Alert and oriented x3;  grossly normal neurologically. Skin:  Intact without significant lesions or rashes. Cervical Nodes:  No significant cervical adenopathy. Psych:  Alert and cooperative. Normal affect.  LAB RESULTS: Recent Labs    02/28/19 0626 03/01/19 0429 03/02/19 0329  WBC 6.6 8.3 10.4  HGB 8.6* 8.6* 8.2*  HCT 25.3* 25.0* 24.1*  PLT 182 184 197   BMET Recent Labs    02/28/19 0626 03/01/19 0429 03/01/19 2014 03/02/19 0329  NA 138 135  --  134*  K 3.4* 4.1 4.2 4.4  CL 97* 96*  --  96*  CO2 29 26  --  22  GLUCOSE 365* 256*  --  130*  BUN 15 21  --  28*  CREATININE 2.46* 3.03*  --  3.61*  CALCIUM 8.0* 8.2*  --  8.3*   LFT Recent Labs    03/01/19 0429  PROT 4.6*  ALBUMIN 1.8*  AST 22  ALT 17  ALKPHOS 58  BILITOT 0.9   PT/INR No results for input(s): LABPROT, INR in the last 72 hours.  STUDIES: DG Chest Port 1 View  Result Date: 03/01/2019 CLINICAL DATA:  Dyspnea. EXAM: PORTABLE CHEST 1 VIEW COMPARISON:  02/09/2019 FINDINGS: Enteric tube courses  through the region of the stomach and into the region of the duodenum in the right abdomen as tip is not definitely visualized. Left IJ dialysis catheter has tip over the SVC. Lungs are adequately inflated and demonstrate subtle hazy prominence of the perihilar markings likely mild vascular congestion unchanged. Stable small amount left pleural fluid likely with associated left basilar atelectasis. Mild stable cardiomegaly. Remainder of the exam is unchanged. IMPRESSION: 1. Mild stable vascular congestion. Stable small left pleural effusion likely with associated basilar atelectasis. 2.  Tubes and lines as described. Electronically Signed   By: Marin Olp M.D.   On: 03/01/2019 06:58      Impression / Plan:   Jamie Arias is a 63 y.o. y/o female with altered mental status, MRSA bacteremia on this admission, with prolonged hospital course and GI being consulted for PEG tube placement  We will plan on possible PEG tube placement tomorrow with another GI provider, if they are available.  Please hold heparin drip tomorrow morning in anticipation of procedure tomorrow  N.p.o. past midnight  Husband at bedside and we discussed the potential need for PEG tube placement and possible risks associated with the procedure.  He states it if this will help her, he is agreeable to the procedure.  I have discussed alternative options, risks & benefits,  which include, but are not limited to, bleeding, infection, perforation,respiratory complication & drug reaction.  The patient agrees with this plan & written consent will be obtained.     Thank you for involving me in the care of this patient.  LOS: 21 days   Virgel Manifold, MD  03/02/2019, 4:24 PM

## 2019-03-02 NOTE — Progress Notes (Signed)
Pharmacy Electrolyte Monitoring Consult:  Pharmacy consulted to assist in monitoring and replacing electrolytes in this 63 y.o. female admitted on 02/09/2019. Patient with ESRD; last dialysis 12/14. Patient with MRSA bacteremia. She has a h/o CAD and is s/p CABG. Hemodialysis MWF.   Labs:  Sodium (mmol/L)  Date Value  03/02/2019 134 (L)  03/30/2014 140   Potassium (mmol/L)  Date Value  03/02/2019 4.4  03/30/2014 4.3   Magnesium (mg/dL)  Date Value  03/02/2019 1.9  07/19/2013 1.7 (L)   Phosphorus (mg/dL)  Date Value  03/02/2019 2.9   Calcium (mg/dL)  Date Value  03/02/2019 8.3 (L)   Calcium, Total (mg/dL)  Date Value  03/30/2014 9.0   Albumin (g/dL)  Date Value  03/01/2019 1.8 (L)  02/11/2018 4.2  07/23/2013 2.7 (L)   Corrected Ca: 10.5 mg/dL  Goals of Therapy Given Cardiac History: Potassium 4.0 - 5.1 mmol/L Magnesium 2.0 - 2.4 mg/dL All Other Electrolytes WNL   Plan:  No further supplementation needed at this time  electrolytes in am  Pharmacy will continue to monitor and adjust per consult.   Dallie Piles, PharmD Clinical Pharmacist 03/02/2019 7:04 AM

## 2019-03-02 NOTE — Progress Notes (Signed)
[facility]  ROUNDING NOTE   Subjective:     HEMODIALYSIS FLOWSHEET:  Blood Flow Rate (mL/min): 350 mL/min Arterial Pressure (mmHg): -140 mmHg Venous Pressure (mmHg): 110 mmHg Transmembrane Pressure (mmHg): 50 mmHg Ultrafiltration Rate (mL/min): 110 mL/min Dialysate Flow Rate (mL/min): 600 ml/min Conductivity: Machine : 14.3 Conductivity: Machine : 14.3 Dialysis Fluid Bolus: Normal Saline Bolus Amount (mL): 250 mL Dialysate Change: 4K(Verbal order change by MD for low K+) Patient was dialyzed today Tolerated well Right lethargic.  Did not answer any questions   Objective:  Vital signs in last 24 hours:  Temp:  [98 F (36.7 C)-99.5 F (37.5 C)] 99.2 F (37.3 C) (12/28 1230) Pulse Rate:  [70-107] 85 (12/28 1230) Resp:  [15-25] 19 (12/28 1230) BP: (91-150)/(39-61) 124/49 (12/28 1230) SpO2:  [98 %-100 %] 98 % (12/28 0637) Weight:  [75.5 kg] 75.5 kg (12/28 0930)  Weight change:  Filed Weights   02/25/19 0842 02/25/19 1200 03/02/19 0930  Weight: 74.1 kg 73.2 kg 75.5 kg    Intake/Output: I/O last 3 completed shifts: In: 550.1 [I.V.:437.1; IV Piggyback:113] Out: -    Intake/Output this shift:  Total I/O In: -  Out: 1000 [Other:1000]  Physical Exam: General: NAD, laying in bed  Head: OGT   Eyes: Anicteric  Lungs:  Clear to auscultation  Heart: Regular rate and rhythm  Abdomen:  Soft, nontender,   Extremities: + peripheral edema b/l  Neurologic: Lethargic, did not respond to verbal or tactile stimuli  Skin: No lesions  Access: Left AVG, Left IJ PC  Rectal rube in place  Basic Metabolic Panel: Recent Labs  Lab 02/26/19 0231 02/27/19 0413 02/28/19 0626 03/01/19 0429 03/01/19 2014 03/02/19 0329  NA 141  140 141 138 135  --  134*  K 3.3*  3.4* 3.0* 3.4* 4.1 4.2 4.4  CL 100  100 98 97* 96*  --  96*  CO2 26  22 31 29 26   --  22  GLUCOSE 198*  198* 219* 365* 256*  --  130*  BUN 9  10 6* 15 21  --  28*  CREATININE 2.29*  2.25* 1.54*  2.46* 3.03*  --  3.61*  CALCIUM 7.9*  7.8* 8.0* 8.0* 8.2*  --  8.3*  MG 1.7 1.9 1.8 1.9  --  1.9  PHOS 1.9* 1.0* 2.1* 1.9* 3.2 2.9    Liver Function Tests: Recent Labs  Lab 02/26/19 0231 02/27/19 0413 02/28/19 0626 03/01/19 0429  AST 26 22 22 22   ALT 20 18 19 17   ALKPHOS 59 59 63 58  BILITOT 1.9* 1.7* 1.0 0.9  PROT 4.9* 5.0* 4.9* 4.6*  ALBUMIN 2.1*  2.1* 2.1* 2.0* 1.8*   No results for input(s): LIPASE, AMYLASE in the last 168 hours. No results for input(s): AMMONIA in the last 168 hours.  CBC: Recent Labs  Lab 02/26/19 1413 02/27/19 0413 02/28/19 0626 03/01/19 0429 03/02/19 0329  WBC 5.8 6.8 6.6 8.3 10.4  HGB 8.9* 9.1* 8.6* 8.6* 8.2*  HCT 27.2* 27.1* 25.3* 25.0* 24.1*  MCV 89.2 87.1 87.5 87.7 87.3  PLT 155 170 182 184 197    Cardiac Enzymes: Recent Labs  Lab 03/02/19 0330  CKTOTAL 31*    BNP: Invalid input(s): POCBNP  CBG: Recent Labs  Lab 03/01/19 1712 03/01/19 2050 03/01/19 2348 03/02/19 0325 03/02/19 0746  GLUCAP 243* 182* 161* 130* 142*    Microbiology: Results for orders placed or performed during the hospital encounter of 02/09/19  Culture, blood (routine x 2)  Status: Abnormal   Collection Time: 02/09/19  2:29 PM   Specimen: BLOOD LEFT ARM  Result Value Ref Range Status   Specimen Description   Final    BLOOD LEFT ARM Performed at [facility], [address], [place], [place] [address]    Special Requests   Final    BOTTLES DRAWN AEROBIC AND ANAEROBIC Blood Culture adequate volume Performed at [facility], [address] [place], [place] [address]    Culture  Setup Time   Final    GRAM POSITIVE COCCI IN BOTH AEROBIC AND ANAEROBIC BOTTLES CRITICAL VALUE NOTED.  VALUE IS CONSISTENT WITH PREVIOUSLY REPORTED AND CALLED VALUE. Performed at [facility], [address] [place], [place] [address]    Culture (A)  Final    STAPHYLOCOCCUS AUREUS SUSCEPTIBILITIES PERFORMED ON PREVIOUS CULTURE WITHIN  THE LAST 5 DAYS. Performed at [facility], [place] [address], [place], [place] [address]    Report Status 02/12/2019 FINAL  Final  Culture, blood (routine x 2)     Status: Abnormal   Collection Time: 02/09/19  2:38 PM   Specimen: BLOOD LEFT ARM  Result Value Ref Range Status   Specimen Description   Final    BLOOD LEFT ARM Performed at [facility], [address], [place], [place] [address]    Special Requests   Final    BOTTLES DRAWN AEROBIC AND ANAEROBIC Blood Culture adequate volume Performed at [facility], [address], [place], [place] [address]    Culture  Setup Time   Final    GRAM POSITIVE COCCI IN BOTH AEROBIC AND ANAEROBIC BOTTLES CRITICAL RESULT CALLED TO, READ BACK BY AND VERIFIED WITH: Rackerby [id number] 02/10/2019 HNM Performed at [facility], [place] [address] [place],  [address]    Culture METHICILLIN RESISTANT STAPHYLOCOCCUS AUREUS (A)  Final   Report Status 02/12/2019 FINAL  Final   Organism ID, Bacteria METHICILLIN RESISTANT STAPHYLOCOCCUS AUREUS  Final      Susceptibility   Methicillin resistant staphylococcus aureus - MIC*    CIPROFLOXACIN >=8 RESISTANT Resistant     ERYTHROMYCIN >=8 RESISTANT Resistant     GENTAMICIN <=0.5 SENSITIVE Sensitive     OXACILLIN >=4 RESISTANT Resistant     TETRACYCLINE <=1 SENSITIVE Sensitive     VANCOMYCIN 1 SENSITIVE Sensitive     TRIMETH/SULFA <=10 SENSITIVE Sensitive     CLINDAMYCIN RESISTANT Resistant     RIFAMPIN <=0.5 SENSITIVE Sensitive     Inducible Clindamycin POSITIVE Resistant     * METHICILLIN RESISTANT STAPHYLOCOCCUS AUREUS  Blood Culture ID Panel (Reflexed)     Status: Abnormal   Collection Time: 02/09/19  2:38 PM  Result Value Ref Range Status   Enterococcus species NOT DETECTED NOT DETECTED Final   Listeria monocytogenes NOT DETECTED NOT DETECTED Final   Staphylococcus species DETECTED (A) NOT DETECTED Final    Comment: CRITICAL RESULT CALLED TO, READ BACK BY AND  VERIFIED WITH: Hart Robinsons PHARMD [id number] 02/10/2019 HNM    Staphylococcus aureus (BCID) DETECTED (A) NOT DETECTED Final    Comment: Methicillin (oxacillin)-resistant Staphylococcus aureus (MRSA). MRSA is predictably resistant to beta-lactam antibiotics (except ceftaroline). Preferred therapy is vancomycin unless clinically contraindicated. Patient requires contact precautions if  hospitalized. CRITICAL RESULT CALLED TO, READ BACK BY AND VERIFIED WITH: Hart Robinsons PHARMD [id number] 02/10/2019 HNM    Methicillin resistance DETECTED (A) NOT DETECTED Final    Comment: CRITICAL RESULT CALLED TO, READ BACK BY AND VERIFIED WITH: SCOTT  HALL PHARMD [id number] 02/10/2019 HNM    Streptococcus species NOT DETECTED NOT DETECTED Final   Streptococcus agalactiae NOT DETECTED NOT DETECTED Final   Streptococcus pneumoniae NOT DETECTED NOT DETECTED Final   Streptococcus pyogenes NOT DETECTED NOT DETECTED Final   Acinetobacter baumannii NOT DETECTED NOT DETECTED Final   Enterobacteriaceae species NOT DETECTED NOT DETECTED Final   Enterobacter cloacae complex NOT DETECTED NOT DETECTED Final   Escherichia coli NOT DETECTED NOT DETECTED Final   Klebsiella oxytoca NOT DETECTED NOT DETECTED Final   Klebsiella pneumoniae NOT DETECTED NOT DETECTED Final   Proteus species NOT DETECTED NOT DETECTED Final   Serratia marcescens NOT DETECTED NOT DETECTED Final   Haemophilus influenzae NOT DETECTED NOT DETECTED Final   Neisseria meningitidis NOT DETECTED NOT DETECTED Final   Pseudomonas aeruginosa NOT DETECTED NOT DETECTED Final   Candida albicans NOT DETECTED NOT DETECTED Final   Candida glabrata NOT DETECTED NOT DETECTED Final   Candida krusei NOT DETECTED NOT DETECTED Final   Candida parapsilosis NOT DETECTED NOT DETECTED Final   Candida tropicalis NOT DETECTED NOT DETECTED Final    Comment: Performed at [facility], [place], [place] [address]  SARS CORONAVIRUS 2 (TAT 6-24 HRS) Nasopharyngeal  Nasopharyngeal Swab     Status: None   Collection Time: 02/09/19  2:54 PM   Specimen: Nasopharyngeal Swab  Result Value Ref Range Status   SARS Coronavirus 2 NEGATIVE NEGATIVE Final    Comment: (NOTE) SARS-CoV-2 target nucleic acids are NOT DETECTED. The SARS-CoV-2 RNA is generally detectable in upper and lower respiratory specimens during the acute phase of infection. Negative results do not preclude SARS-CoV-2 infection, do not rule out co-infections with other pathogens, and should not be used as the sole basis for treatment or other patient management decisions. Negative results must be combined with clinical observations, patient history, and epidemiological information. The expected result is Negative. Fact Sheet for Patients: SugarRoll.be Fact Sheet for Healthcare Providers: https://www.woods-mathews.com/ This test is not yet approved or cleared by the [place] FDA and  has been authorized for detection and/or diagnosis of SARS-CoV-2 by FDA under an Emergency Use Authorization (EUA). This EUA will remain  in effect (meaning this test can be used) for the duration of the COVID-19 declaration under Section 56 4(b)(1) of the Act, 21 U.S.C. section 360bbb-3(b)(1), unless the authorization is terminated or revoked sooner. Performed at [facility], [place] [address], [place], [place] [address]   MRSA PCR Screening     Status: Abnormal   Collection Time: 02/10/19  5:03 PM   Specimen: Nasopharyngeal  Result Value Ref Range Status   MRSA by PCR POSITIVE (A) NEGATIVE Final    Comment:        The GeneXpert MRSA Assay (FDA approved for NASAL specimens only), is one component of a comprehensive MRSA colonization surveillance program. It is not intended to diagnose MRSA infection nor to guide or monitor treatment for MRSA infections. RESULT CALLED TO, READ BACK BY AND VERIFIED WITH: ALEKSEY FRASER @1839  02/10/19 MJU Performed at  [facility], [address] [place], [place] [address]   Culture, blood (routine x 2)     Status: Abnormal   Collection Time: 02/11/19 12:31 PM   Specimen: BLOOD  Result Value Ref Range Status   Specimen Description   Final    BLOOD PORTA CATH Performed at [place] [address] [place], [place] [address]    Special Requests   Final  BOTTLES DRAWN AEROBIC AND ANAEROBIC Blood Culture adequate volume Performed at [facility], [address] [place], [place] [address]    Culture  Setup Time   Final    GRAM POSITIVE COCCI ANAEROBIC BOTTLE ONLY CRITICAL RESULT CALLED TO, READ BACK BY AND VERIFIED WITH: SCOTT HALL AT [id number] 02/12/2019 [facility]  Performed at [facility], [address] [place], [place] [address]    Culture (A)  Final    STAPHYLOCOCCUS AUREUS SUSCEPTIBILITIES PERFORMED ON PREVIOUS CULTURE WITHIN THE LAST 5 DAYS. Performed at [facility], [place] [address] [place], [place] [address]    Report Status 02/13/2019 FINAL  Final  Culture, blood (routine x 2)     Status: None   Collection Time: 02/11/19  3:57 PM   Specimen: BLOOD  Result Value Ref Range Status   Specimen Description BLOOD PORTA CATH  Final   Special Requests   Final    BOTTLES DRAWN AEROBIC AND ANAEROBIC Blood Culture adequate volume   Culture   Final    NO GROWTH 5 DAYS Performed at [facility], [address] [place], [place] [address]    Report Status 02/16/2019 FINAL  Final  CULTURE, BLOOD (ROUTINE X 2) w Reflex to ID Panel     Status: Abnormal   Collection Time: 02/13/19  2:18 PM   Specimen: BLOOD LEFT HAND  Result Value Ref Range Status   Specimen Description BLOOD LEFT HAND  Final   Special Requests   Final    BOTTLES DRAWN AEROBIC AND ANAEROBIC Blood Culture results may not be optimal due to an inadequate volume of blood received in culture bottles   Culture  Setup Time   Final    AEROBIC BOTTLE ONLY GRAM POSITIVE COCCI IN  CLUSTERS CRITICAL RESULT CALLED TO, READ BACK BY AND VERIFIED WITH: Herbert Pun AT [id number] ON 02/14/2019 [place]. Performed at [facility], [place] [address], [place], [place] [address]    Culture METHICILLIN RESISTANT STAPHYLOCOCCUS AUREUS (A)  Final   Report Status 02/16/2019 FINAL  Final   Organism ID, Bacteria METHICILLIN RESISTANT STAPHYLOCOCCUS AUREUS  Final      Susceptibility   Methicillin resistant staphylococcus aureus - MIC*    CIPROFLOXACIN >=8 RESISTANT Resistant     ERYTHROMYCIN >=8 RESISTANT Resistant     GENTAMICIN <=0.5 SENSITIVE Sensitive     OXACILLIN >=4 RESISTANT Resistant     TETRACYCLINE <=1 SENSITIVE Sensitive     VANCOMYCIN <=0.5 SENSITIVE Sensitive     TRIMETH/SULFA <=10 SENSITIVE Sensitive     CLINDAMYCIN RESISTANT Resistant     RIFAMPIN <=0.5 SENSITIVE Sensitive     Inducible Clindamycin POSITIVE Resistant     * METHICILLIN RESISTANT STAPHYLOCOCCUS AUREUS  Blood Culture ID Panel (Reflexed)     Status: Abnormal   Collection Time: 02/13/19  2:18 PM  Result Value Ref Range Status   Enterococcus species NOT DETECTED NOT DETECTED Final   Listeria monocytogenes NOT DETECTED NOT DETECTED Final   Staphylococcus species DETECTED (A) NOT DETECTED Final    Comment: CRITICAL RESULT CALLED TO, READ BACK BY AND VERIFIED WITH: ABBEY ELLINGTON AT [id number] ON 02/14/2019 [place].    Staphylococcus aureus (BCID) DETECTED (A) NOT DETECTED Final    Comment: Methicillin (oxacillin)-resistant Staphylococcus aureus (MRSA). MRSA is predictably resistant to beta-lactam antibiotics (except ceftaroline). Preferred therapy is vancomycin unless clinically contraindicated. Patient requires contact precautions if  hospitalized. CRITICAL RESULT CALLED TO, READ BACK BY AND VERIFIED WITH: Herbert Pun AT [id number] ON 02/14/2019 [place].  Methicillin resistance DETECTED (A) NOT DETECTED Final    Comment: CRITICAL RESULT CALLED TO, READ BACK BY AND VERIFIED WITH: Herbert Pun AT [id number] ON  02/14/2019 [place].    Streptococcus species NOT DETECTED NOT DETECTED Final   Streptococcus agalactiae NOT DETECTED NOT DETECTED Final   Streptococcus pneumoniae NOT DETECTED NOT DETECTED Final   Streptococcus pyogenes NOT DETECTED NOT DETECTED Final   Acinetobacter baumannii NOT DETECTED NOT DETECTED Final   Enterobacteriaceae species NOT DETECTED NOT DETECTED Final   Enterobacter cloacae complex NOT DETECTED NOT DETECTED Final   Escherichia coli NOT DETECTED NOT DETECTED Final   Klebsiella oxytoca NOT DETECTED NOT DETECTED Final   Klebsiella pneumoniae NOT DETECTED NOT DETECTED Final   Proteus species NOT DETECTED NOT DETECTED Final   Serratia marcescens NOT DETECTED NOT DETECTED Final   Haemophilus influenzae NOT DETECTED NOT DETECTED Final   Neisseria meningitidis NOT DETECTED NOT DETECTED Final   Pseudomonas aeruginosa NOT DETECTED NOT DETECTED Final   Candida albicans NOT DETECTED NOT DETECTED Final   Candida glabrata NOT DETECTED NOT DETECTED Final   Candida krusei NOT DETECTED NOT DETECTED Final   Candida parapsilosis NOT DETECTED NOT DETECTED Final   Candida tropicalis NOT DETECTED NOT DETECTED Final    Comment: Performed at [facility], [address] [place], [place] [address]  CULTURE, BLOOD (ROUTINE X 2) w Reflex to ID Panel     Status: None   Collection Time: 02/13/19  3:09 PM   Specimen: BLOOD  Result Value Ref Range Status   Specimen Description BLOOD BLOOD LEFT HAND  Final   Special Requests   Final    BOTTLES DRAWN AEROBIC AND ANAEROBIC Blood Culture adequate volume   Culture   Final    NO GROWTH 5 DAYS Performed at [facility], [address] [place], [place] [address]    Report Status 02/18/2019 FINAL  Final  Culture, blood (routine x 2)     Status: Abnormal   Collection Time: 02/15/19 12:49 PM   Specimen: BLOOD  Result Value Ref Range Status   Specimen Description   Final    BLOOD LT HAND Performed at [facility], [address] [place], [place] [address]    Special Requests   Final    BOTTLES DRAWN AEROBIC AND ANAEROBIC Blood Culture results may not be optimal due to an inadequate volume of blood received in culture bottles Performed at [facility], [address] [place], [place] [address]    Culture  Setup Time   Final    GRAM POSITIVE COCCI IN BOTH AEROBIC AND ANAEROBIC BOTTLES CRITICAL RESULT CALLED TO, READ BACK BY AND VERIFIED WITHEleonore Chiquito AT [id number] 02/16/2019 SDR Performed at [facility], [address] [place], [place] [address]    Culture (A)  Final    METHICILLIN RESISTANT STAPHYLOCOCCUS AUREUS STAPHYLOCOCCUS SPECIES (COAGULASE NEGATIVE) THE SIGNIFICANCE OF ISOLATING THIS ORGANISM FROM A SINGLE SET OF BLOOD CULTURES WHEN MULTIPLE SETS ARE DRAWN IS UNCERTAIN. PLEASE NOTIFY THE MICROBIOLOGY DEPARTMENT WITHIN ONE WEEK IF SPECIATION AND SENSITIVITIES ARE REQUIRED. Performed at [facility], [place] [address], [place], [place] [address]    Report Status 02/18/2019 FINAL  Final   Organism ID, Bacteria METHICILLIN RESISTANT STAPHYLOCOCCUS AUREUS  Final      Susceptibility   Methicillin resistant staphylococcus aureus - MIC*    CIPROFLOXACIN >=8 RESISTANT Resistant     ERYTHROMYCIN >=8 RESISTANT Resistant     GENTAMICIN <=0.5 SENSITIVE Sensitive  OXACILLIN >=4 RESISTANT Resistant     TETRACYCLINE <=1 SENSITIVE Sensitive     VANCOMYCIN <=0.5 SENSITIVE Sensitive     TRIMETH/SULFA <=10 SENSITIVE Sensitive     CLINDAMYCIN RESISTANT Resistant     RIFAMPIN <=0.5 SENSITIVE Sensitive     Inducible Clindamycin POSITIVE Resistant     * METHICILLIN RESISTANT STAPHYLOCOCCUS AUREUS  Aerobic Culture (superficial specimen)     Status: None   Collection Time: 02/17/19  1:31 PM   Specimen: Heel; Wound  Result Value Ref Range Status   Specimen Description   Final    HEEL Performed at [facility], [address], [place], [place] [address]    Special  Requests   Final    NONE Performed at [facility], [address] [place], [place] [address]    Gram Stain   Final    NO WBC SEEN MODERATE GRAM POSITIVE COCCI IN PAIRS    Culture   Final    ABUNDANT METHICILLIN RESISTANT STAPHYLOCOCCUS AUREUS ABUNDANT DIPHTHEROIDS(CORYNEBACTERIUM SPECIES) Standardized susceptibility testing for this organism is not available. Performed at [facility], [place] [address] [place], [place] [address]    Report Status 02/19/2019 FINAL  Final   Organism ID, Bacteria METHICILLIN RESISTANT STAPHYLOCOCCUS AUREUS  Final      Susceptibility   Methicillin resistant staphylococcus aureus - MIC*    CIPROFLOXACIN >=8 RESISTANT Resistant     ERYTHROMYCIN >=8 RESISTANT Resistant     GENTAMICIN <=0.5 SENSITIVE Sensitive     OXACILLIN >=4 RESISTANT Resistant     TETRACYCLINE <=1 SENSITIVE Sensitive     VANCOMYCIN 1 SENSITIVE Sensitive     TRIMETH/SULFA <=10 SENSITIVE Sensitive     CLINDAMYCIN RESISTANT Resistant     RIFAMPIN <=0.5 SENSITIVE Sensitive     Inducible Clindamycin POSITIVE Resistant     * ABUNDANT METHICILLIN RESISTANT STAPHYLOCOCCUS AUREUS  CULTURE, BLOOD (ROUTINE X 2) w Reflex to ID Panel     Status: None   Collection Time: 02/18/19 11:29 PM   Specimen: BLOOD  Result Value Ref Range Status   Specimen Description BLOOD LEFT ARM  Final   Special Requests   Final    BOTTLES DRAWN AEROBIC AND ANAEROBIC Blood Culture adequate volume   Culture   Final    NO GROWTH 5 DAYS Performed at [facility], [address] [place], [place] [address]    Report Status 02/23/2019 FINAL  Final  CULTURE, BLOOD (ROUTINE X 2) w Reflex to ID Panel     Status: None   Collection Time: 02/18/19 11:29 PM   Specimen: BLOOD  Result Value Ref Range Status   Specimen Description BLOOD LEFT ARM  Final   Special Requests   Final    BOTTLES DRAWN AEROBIC AND ANAEROBIC Blood Culture adequate volume   Culture   Final    NO GROWTH 5 DAYS Performed at  [facility], [address] [place], [place] [address]    Report Status 02/23/2019 FINAL  Final    Coagulation Studies: No results for input(s): LABPROT, INR in the last 72 hours.  Urinalysis: No results for input(s): COLORURINE, LABSPEC, PHURINE, GLUCOSEU, HGBUR, BILIRUBINUR, KETONESUR, PROTEINUR, UROBILINOGEN, NITRITE, LEUKOCYTESUR in the last 72 hours.  Invalid input(s): APPERANCEUR    Imaging: DG Chest Port 1 View  Result Date: 03/01/2019 CLINICAL DATA:  Dyspnea. EXAM: PORTABLE CHEST 1 VIEW COMPARISON:  02/09/2019 FINDINGS: Enteric tube courses through the region of the stomach and into the region of the duodenum in the right  abdomen as tip is not definitely visualized. Left IJ dialysis catheter has tip over the SVC. Lungs are adequately inflated and demonstrate subtle hazy prominence of the perihilar markings likely mild vascular congestion unchanged. Stable small amount left pleural fluid likely with associated left basilar atelectasis. Mild stable cardiomegaly. Remainder of the exam is unchanged. IMPRESSION: 1. Mild stable vascular congestion. Stable small left pleural effusion likely with associated basilar atelectasis. 2.  Tubes and lines as described. Electronically Signed   By: Marin Olp M.D.   On: 03/01/2019 06:58     Medications:   . sodium chloride Stopped (03/01/19 0429)  . sodium chloride    . DAPTOmycin (CUBICIN)  IV Stopped (02/28/19 2242)  . feeding supplement (OSMOLITE 1.5 CAL) 1,000 mL (03/01/19 1139)  . heparin 1,100 Units/hr (03/02/19 0641)  . vancomycin     . aspirin  81 mg Oral Daily  . B-complex with vitamin C  1 tablet Per Tube Daily  . chlorhexidine  15 mL Mouth Rinse BID  . Chlorhexidine Gluconate Cloth  6 each Topical Q0600  . epoetin (EPOGEN/PROCRIT) injection  4,000 Units Intravenous Q M,W,F-HD  . feeding supplement (PRO-STAT SUGAR FREE 64)  30 mL Per Tube Daily  . fluticasone  2 spray Each Nare Daily  . free water  30 mL Per Tube  Q4H  . insulin aspart  0-15 Units Subcutaneous Q4H  . insulin aspart  3 Units Subcutaneous Q4H  . insulin glargine  10 Units Subcutaneous Daily  . levothyroxine  88 mcg Oral QAC breakfast  . mouth rinse  15 mL Mouth Rinse q12n4p  . midodrine  10 mg Oral TID WC  . pantoprazole (PROTONIX) IV  40 mg Intravenous Q24H  . venlafaxine  75 mg Oral BID WC  . zinc sulfate  220 mg Oral Daily   sodium chloride, sodium chloride, haloperidol lactate, ondansetron (ZOFRAN) IV  Assessment/ Plan:  [name] is a 63 y.o. white female with end-stage renal disease on hemodialysis, atrial fibrillation, chronic diastolic congestive heart failure, coronary artery disease status post CABG, diabetes mellitus type II, hypertension, hyperlipidemia, hypothyroidism, Covid positive in July 2020, recent nursing home stay in [place] Prolonged hospitalization for MRSA bacteremia/sepsis and now with acute encephalopathy  [facility] MWF [facility] [address] Left AVG 81.5kg  1. End Stage Renal Disease: on hemodialysis. With complication of dialysis device. Angiogram on 12/21 shows no communication with bleed to AVG.  Placed tunneled catheter until her AVG site has healed. -  MWF schedule.   2. Hypotension - midodrine 10 mg TID  3. Anemia of chronic kidney disease:   Status post PRBC transfusion on 12/22 - EPO with HD treatments.   4. Secondary Hyperparathyroidism:  not currently on binders.  Lab Results  Component Value Date   PTH 135 (H) 02/22/2017   CALCIUM 8.3 (L) 03/02/2019   CAION 1.20 07/03/2013   PHOS 2.9 03/02/2019    5. Acute Encephalopathy: improving off ceftaroline.   6. Sepsis from MRSA - Daptomycin and Vanc  7. A Fib - requiring Anticoagulation with heparin Drip, Eliquis on Hold  8. DM with CKD Insulin dependent Lab Results  Component Value Date   HGBA1C 5.3 02/09/2019        LOS: 21 Jasmine Maceachern 12/28/202012:58 PM

## 2019-03-02 NOTE — Progress Notes (Signed)
[name], female   DOB: 06/04/1955, 63 y.o.   MRN: [id number] [facility] PROGRESS NOTE  ARELIA PAS [id number] DOB: May 23, 1955 DOA: 02/09/2019 PCP: [name], MD  HPI/Subjective: Patient again was able to open up her eyes and then closes her eyes again.  Did not speak with me.  Was able to wiggle her fingers and her toes but unable to lift her arms up off the bed.  Objective: Vitals:   03/02/19 1200 03/02/19 1215  BP: (!) 96/59 (!) 109/47  Pulse: 81 80  Resp: (!) 25 (!) 22  Temp:    SpO2:      Intake/Output Summary (Last 24 hours) at 03/02/2019 1226 Last data filed at 03/02/2019 0300 Gross per 24 hour  Intake 253.74 ml  Output --  Net 253.74 ml   Filed Weights   02/25/19 0842 02/25/19 1200 03/02/19 0930  Weight: 74.1 kg 73.2 kg 75.5 kg    ROS: Review of Systems  Unable to perform ROS: Acuity of condition   Exam: Physical Exam  Constitutional: She appears lethargic.  HENT:  Nose: No mucosal edema.  Eyes: Pupils are equal, round, and reactive to light. Conjunctivae and lids are normal.  Neck: Carotid bruit is not present.  Cardiovascular: S1 normal and S2 normal. Exam reveals no gallop.  No murmur heard. Pulses:      Dorsalis pedis pulses are 2+ on the right side and 2+ on the left side.  Respiratory: No respiratory distress. She has decreased breath sounds in the right lower field and the left lower field. She has no wheezes. She has no rhonchi. She has no rales.  GI: Soft. Bowel sounds are normal. There is no abdominal tenderness.  Musculoskeletal:     Right ankle: No swelling.     Left ankle: No swelling.  Lymphadenopathy:    She has no cervical adenopathy.  Neurological: She appears lethargic.  Able to wiggle toes and fingers.  Skin: Skin is warm. Nails show no clubbing.  Left heel covered. Right heel stage II decubitus ulcer  Psychiatric:  Unable to test secondary to altered mental status      Data  Reviewed: Basic Metabolic Panel: Recent Labs  Lab 02/26/19 0231 02/27/19 0413 02/28/19 0626 03/01/19 0429 03/01/19 2014 03/02/19 0329  NA 141  140 141 138 135  --  134*  K 3.3*  3.4* 3.0* 3.4* 4.1 4.2 4.4  CL 100  100 98 97* 96*  --  96*  CO2 26  22 31 29 26   --  22  GLUCOSE 198*  198* 219* 365* 256*  --  130*  BUN 9  10 6* 15 21  --  28*  CREATININE 2.29*  2.25* 1.54* 2.46* 3.03*  --  3.61*  CALCIUM 7.9*  7.8* 8.0* 8.0* 8.2*  --  8.3*  MG 1.7 1.9 1.8 1.9  --  1.9  PHOS 1.9* 1.0* 2.1* 1.9* 3.2 2.9   Liver Function Tests: Recent Labs  Lab 02/26/19 0231 02/27/19 0413 02/28/19 0626 03/01/19 0429  AST 26 22 22 22   ALT 20 18 19 17   ALKPHOS 59 59 63 58  BILITOT 1.9* 1.7* 1.0 0.9  PROT 4.9* 5.0* 4.9* 4.6*  ALBUMIN 2.1*  2.1* 2.1* 2.0* 1.8*   CBC: Recent Labs  Lab 02/26/19 1413 02/27/19 0413 02/28/19 0626 03/01/19 0429 03/02/19 0329  WBC 5.8 6.8 6.6 8.3 10.4  HGB 8.9* 9.1* 8.6* 8.6* 8.2*  HCT 27.2* 27.1* 25.3* 25.0* 24.1*  MCV  89.2 87.1 87.5 87.7 87.3  PLT 155 170 182 184 197   Cardiac Enzymes: Recent Labs  Lab 03/02/19 0330  CKTOTAL 31*   BNP (last 3 results) Recent Labs    03/01/19 0429  BNP 720.0*     CBG: Recent Labs  Lab 03/01/19 1712 03/01/19 2050 03/01/19 2348 03/02/19 0325 03/02/19 0746  GLUCAP 243* 182* 161* 130* 142*     Studies: DG Chest Port 1 View  Result Date: 03/01/2019 CLINICAL DATA:  Dyspnea. EXAM: PORTABLE CHEST 1 VIEW COMPARISON:  02/09/2019 FINDINGS: Enteric tube courses through the region of the stomach and into the region of the duodenum in the right abdomen as tip is not definitely visualized. Left IJ dialysis catheter has tip over the SVC. Lungs are adequately inflated and demonstrate subtle hazy prominence of the perihilar markings likely mild vascular congestion unchanged. Stable small amount left pleural fluid likely with associated left basilar atelectasis. Mild stable cardiomegaly. Remainder of the exam is  unchanged. IMPRESSION: 1. Mild stable vascular congestion. Stable small left pleural effusion likely with associated basilar atelectasis. 2.  Tubes and lines as described. Electronically Signed   By: Marin Olp M.D.   On: 03/01/2019 06:58    Scheduled Meds: . aspirin  81 mg Oral Daily  . B-complex with vitamin C  1 tablet Per Tube Daily  . chlorhexidine  15 mL Mouth Rinse BID  . Chlorhexidine Gluconate Cloth  6 each Topical Q0600  . epoetin (EPOGEN/PROCRIT) injection  4,000 Units Intravenous Q M,W,F-HD  . feeding supplement (PRO-STAT SUGAR FREE 64)  30 mL Per Tube Daily  . fluticasone  2 spray Each Nare Daily  . free water  30 mL Per Tube Q4H  . insulin aspart  0-15 Units Subcutaneous Q4H  . insulin aspart  3 Units Subcutaneous Q4H  . insulin glargine  10 Units Subcutaneous Daily  . levothyroxine  88 mcg Oral QAC breakfast  . mouth rinse  15 mL Mouth Rinse q12n4p  . midodrine  10 mg Oral TID WC  . pantoprazole (PROTONIX) IV  40 mg Intravenous Q24H  . venlafaxine  75 mg Oral BID WC  . zinc sulfate  220 mg Oral Daily   Continuous Infusions: . sodium chloride Stopped (03/01/19 0429)  . sodium chloride    . DAPTOmycin (CUBICIN)  IV Stopped (02/28/19 2242)  . feeding supplement (OSMOLITE 1.5 CAL) 1,000 mL (03/01/19 1139)  . heparin 1,100 Units/hr (03/02/19 0641)  . vancomycin      Assessment/Plan:   1. Sepsis secondary to MRSA bacteremia.  Currently on daptomycin and vancomycin.   Appreciate ID consultation.  TEE recommended but mental status still impaired. 2. Acute blood loss anemia from right arm this has resolved. 3. End-stage renal disease on hemodialysis.  Tunneled catheter placed by vascular.  Patient seen at dialysis today. 4. Acute metabolic encephalopathy.  Mental status still impaired.  MRI of the brain negative.  EEG negative.  Ammonia normal.  TSH normal.  In speaking with prior rounding physicians patient's mental status has been impaired for a while now. 5. Left arm  DVT on heparin drip 6. Chronic atrial fibrillation on amiodarone.  Eliquis on hold for right now currently on heparin drip. 7. Chronic diastolic congestive heart failure.  Dialysis to manage fluid status. 8. Type 2 diabetes mellitus on low-dose Lantus.  Since patient on tube feeding will do NovoLog 3 units every 4 hours. 9. Hypothyroidism unspecified on Synthroid 10. Overall prognosis is poor.  Palliative care following.  Patient  a DNR. 11. Diarrhea.  Hold off on stool studies at this time. 12. Stage II decubiti right heel, left heel covered with dressing. 13. Nutrition: NG tube feeds.  Will consult GI for possible PEG.  Code Status:     Code Status Orders  (From admission, onward)         Start     Ordered   02/21/19 0930  Do not attempt resuscitation (DNR)  Continuous    Question Answer Comment  In the event of cardiac or respiratory ARREST Do not call a "code blue"   In the event of cardiac or respiratory ARREST Do not perform Intubation, CPR, defibrillation or ACLS   In the event of cardiac or respiratory ARREST Use medication by any route, position, wound care, and other measures to relive pain and suffering. May use oxygen, suction and manual treatment of airway obstruction as needed for comfort.      02/21/19 0932        Code Status History    Date Active Date Inactive Code Status Order ID Comments User Context   02/09/2019 [id number] 02/21/2019 0932 Full Code [id number]  Thornell Mule, MD ED   08/24/2018 0022 09/15/2018 1720 Full Code [id number]  Shela Leff, MD Inpatient   09/04/2017 1510 09/06/2017 1920 Full Code [id number]  Epifanio Lesches, MD ED   02/15/2017 1243 02/16/2017 2023 Full Code [id number]  Saundra Shelling, MD Inpatient   10/23/2015 0828 10/23/2015 0908 Full Code [id number]  Saundra Shelling, MD Inpatient   09/03/2015 0130 09/03/2015 1619 Full Code [id number]  Lance Coon, MD Inpatient   07/02/2013 1327 07/14/2013 1540 Full Code [id number]  Nani Skillern, PA-C  Inpatient   06/29/2013 2153 07/02/2013 1327 Full Code [id number]  Larey Dresser, MD Inpatient   Advance Care Planning Activity     Family Communication: Spoke with husband on the phone Disposition Plan: To be determined  Consultants:  Nephrology  Infectious disease  Antibiotics:  Daptomycin  Vancomycin  Time spent: 27 minutes  [place]

## 2019-03-03 ENCOUNTER — Inpatient Hospital Stay: Payer: Medicare Other

## 2019-03-03 ENCOUNTER — Inpatient Hospital Stay: Payer: Medicare Other | Admitting: Anesthesiology

## 2019-03-03 ENCOUNTER — Encounter
Admission: EM | Disposition: A | Discharge status: Skilled Nursing Facility | Payer: Self-pay | Source: Home / Self Care | Attending: Internal Medicine

## 2019-03-03 ENCOUNTER — Encounter: Payer: Self-pay | Admitting: Family Medicine

## 2019-03-03 DIAGNOSIS — R1312 Dysphagia, oropharyngeal phase: Secondary | ICD-10-CM

## 2019-03-03 DIAGNOSIS — R112 Nausea with vomiting, unspecified: Secondary | ICD-10-CM

## 2019-03-03 DIAGNOSIS — R633 Feeding difficulties: Secondary | ICD-10-CM

## 2019-03-03 DIAGNOSIS — E1140 Type 2 diabetes mellitus with diabetic neuropathy, unspecified: Secondary | ICD-10-CM

## 2019-03-03 DIAGNOSIS — I480 Paroxysmal atrial fibrillation: Secondary | ICD-10-CM

## 2019-03-03 DIAGNOSIS — R1310 Dysphagia, unspecified: Secondary | ICD-10-CM

## 2019-03-03 DIAGNOSIS — E1122 Type 2 diabetes mellitus with diabetic chronic kidney disease: Secondary | ICD-10-CM

## 2019-03-03 DIAGNOSIS — R197 Diarrhea, unspecified: Secondary | ICD-10-CM

## 2019-03-03 DIAGNOSIS — L89622 Pressure ulcer of left heel, stage 2: Secondary | ICD-10-CM

## 2019-03-03 DIAGNOSIS — J9601 Acute respiratory failure with hypoxia: Secondary | ICD-10-CM

## 2019-03-03 HISTORY — PX: PEG PLACEMENT: SHX5437

## 2019-03-03 LAB — PHOSPHORUS: Phosphorus: 1.8 mg/dL — ABNORMAL LOW (ref 2.5–4.6)

## 2019-03-03 LAB — BASIC METABOLIC PANEL
Anion gap: 12 (ref 5–15)
Anion gap: 10 (ref 5–15)
BUN: 19 mg/dL (ref 8–23)
BUN: 22 mg/dL (ref 8–23)
CO2: 26 mmol/L (ref 22–32)
CO2: 28 mmol/L (ref 22–32)
Calcium: 8.3 mg/dL — ABNORMAL LOW (ref 8.9–10.3)
Calcium: 8.4 mg/dL — ABNORMAL LOW (ref 8.9–10.3)
Chloride: 101 mmol/L (ref 98–111)
Chloride: 100 mmol/L (ref 98–111)
Creatinine, Ser: 2.44 mg/dL — ABNORMAL HIGH (ref 0.44–1.00)
Creatinine, Ser: 2.95 mg/dL — ABNORMAL HIGH (ref 0.44–1.00)
GFR calc Af Amer: 23 mL/min — ABNORMAL LOW (ref >60)
GFR calc Af Amer: 19 mL/min — ABNORMAL LOW (ref >60)
GFR calc non Af Amer: 20 mL/min — ABNORMAL LOW (ref >60)
GFR calc non Af Amer: 16 mL/min — ABNORMAL LOW (ref >60)
Glucose, Bld: 125 mg/dL — ABNORMAL HIGH (ref 70–99)
Glucose, Bld: 74 mg/dL (ref 70–99)
Potassium: 4 mmol/L (ref 3.5–5.1)
Potassium: 4 mmol/L (ref 3.5–5.1)
Sodium: 139 mmol/L (ref 135–145)
Sodium: 138 mmol/L (ref 135–145)

## 2019-03-03 LAB — BLOOD GAS, ARTERIAL
Acid-Base Excess: 8.1 mmol/L — ABNORMAL HIGH (ref 0.0–2.0)
Bicarbonate: 30.4 mmol/L — ABNORMAL HIGH (ref 20.0–28.0)
FIO2: 0.4
MECHVT: 450 mL
O2 Saturation: 99.8 %
PEEP: 5 cmH2O
Patient temperature: 37
RATE: 14 resp/min
pCO2 arterial: 34 mmHg (ref 32.0–48.0)
pH, Arterial: 7.56 — ABNORMAL HIGH (ref 7.350–7.450)
pO2, Arterial: 190 mmHg — ABNORMAL HIGH (ref 83.0–108.0)

## 2019-03-03 LAB — GLUCOSE, CAPILLARY
Glucose-Capillary: 153 mg/dL — ABNORMAL HIGH (ref 70–99)
Glucose-Capillary: 114 mg/dL — ABNORMAL HIGH (ref 70–99)
Glucose-Capillary: 101 mg/dL — ABNORMAL HIGH (ref 70–99)
Glucose-Capillary: 112 mg/dL — ABNORMAL HIGH (ref 70–99)
Glucose-Capillary: 79 mg/dL (ref 70–99)
Glucose-Capillary: 80 mg/dL (ref 70–99)
Glucose-Capillary: 84 mg/dL (ref 70–99)

## 2019-03-03 LAB — CBC
HCT: 26.3 % — ABNORMAL LOW (ref 36.0–46.0)
HCT: 26.6 % — ABNORMAL LOW (ref 36.0–46.0)
Hemoglobin: 8.9 g/dL — ABNORMAL LOW (ref 12.0–15.0)
Hemoglobin: 8.6 g/dL — ABNORMAL LOW (ref 12.0–15.0)
MCH: 29.9 pg (ref 26.0–34.0)
MCH: 30.3 pg (ref 26.0–34.0)
MCHC: 33.8 g/dL (ref 30.0–36.0)
MCHC: 32.3 g/dL (ref 30.0–36.0)
MCV: 88.3 fL (ref 80.0–100.0)
MCV: 93.7 fL (ref 80.0–100.0)
Platelets: 172 10*3/uL (ref 150–400)
Platelets: 219 10*3/uL (ref 150–400)
RBC: 2.98 MIL/uL — ABNORMAL LOW (ref 3.87–5.11)
RBC: 2.84 MIL/uL — ABNORMAL LOW (ref 3.87–5.11)
RDW: 21.3 % — ABNORMAL HIGH (ref 11.5–15.5)
RDW: 21.1 % — ABNORMAL HIGH (ref 11.5–15.5)
WBC: 10 10*3/uL (ref 4.0–10.5)
WBC: 10 10*3/uL (ref 4.0–10.5)
nRBC: 0.2 % (ref 0.0–0.2)
nRBC: 0.4 % — ABNORMAL HIGH (ref 0.0–0.2)

## 2019-03-03 LAB — MAGNESIUM: Magnesium: 2 mg/dL (ref 1.7–2.4)

## 2019-03-03 LAB — HEPARIN LEVEL (UNFRACTIONATED): Heparin Unfractionated: 0.22 IU/mL — ABNORMAL LOW (ref 0.30–0.70)

## 2019-03-03 SURGERY — INSERTION, PEG TUBE
Anesthesia: General

## 2019-03-03 MED ORDER — IPRATROPIUM-ALBUTEROL 0.5-2.5 (3) MG/3ML IN SOLN: 3.0000 mL | RESPIRATORY_TRACT | Status: DC | PRN

## 2019-03-03 MED ORDER — FENTANYL CITRATE (PF) 100 MCG/2ML IJ SOLN: 25.0000 ug | INTRAMUSCULAR | Status: DC | PRN

## 2019-03-03 MED ORDER — FENTANYL CITRATE (PF) 100 MCG/2ML IJ SOLN
INTRAMUSCULAR | Status: AC
  Filled 2019-03-03: qty 2

## 2019-03-03 MED ORDER — SUGAMMADEX SODIUM 200 MG/2ML IV SOLN
INTRAVENOUS | Status: AC
  Filled 2019-03-03: qty 2

## 2019-03-03 MED ORDER — PHENYLEPHRINE HCL (PRESSORS) 10 MG/ML IV SOLN
INTRAVENOUS | Status: DC | PRN
  Administered 2019-03-03: 100 ug via INTRAVENOUS

## 2019-03-03 MED ORDER — LIDOCAINE HCL (CARDIAC) PF 100 MG/5ML IV SOSY
PREFILLED_SYRINGE | INTRAVENOUS | Status: DC | PRN
  Administered 2019-03-03: 100 mg via INTRAVENOUS

## 2019-03-03 MED ORDER — SUGAMMADEX SODIUM 200 MG/2ML IV SOLN
INTRAVENOUS | Status: DC | PRN
  Administered 2019-03-03: 100 mg via INTRAVENOUS
  Administered 2019-03-03: 200 mg via INTRAVENOUS

## 2019-03-03 MED ORDER — NOREPINEPHRINE 16 MG/250ML-% IV SOLN
0.0000 ug/min | INTRAVENOUS | Status: DC
  Administered 2019-03-03: 2 ug/min via INTRAVENOUS
  Filled 2019-03-03 (×2): qty 250

## 2019-03-03 MED ORDER — FENTANYL CITRATE (PF) 100 MCG/2ML IJ SOLN
INTRAMUSCULAR | Status: DC | PRN
  Administered 2019-03-03: 25 ug via INTRAVENOUS

## 2019-03-03 MED ORDER — VANCOMYCIN HCL 1250 MG/250ML IV SOLN: 1250.0000 mg | Freq: Once | INTRAVENOUS | Status: DC

## 2019-03-03 MED ORDER — SUCCINYLCHOLINE CHLORIDE 20 MG/ML IJ SOLN
INTRAMUSCULAR | Status: DC | PRN
  Administered 2019-03-03: 100 mg via INTRAVENOUS

## 2019-03-03 MED ORDER — DEXMEDETOMIDINE HCL IN NACL 400 MCG/100ML IV SOLN
0.4000 ug/kg/h | INTRAVENOUS | Status: DC
  Administered 2019-03-03: 0.4 ug/kg/h via INTRAVENOUS
  Administered 2019-03-04: 0.6 ug/kg/h via INTRAVENOUS
  Administered 2019-03-04 (×2): 0.9 ug/kg/h via INTRAVENOUS
  Administered 2019-03-05 – 2019-03-06 (×3): 0.6 ug/kg/h via INTRAVENOUS
  Filled 2019-03-03 (×7): qty 100

## 2019-03-03 MED ORDER — ORAL CARE MOUTH RINSE
15.0000 mL | OROMUCOSAL | Status: DC
  Administered 2019-03-03 – 2019-03-05 (×17): 15 mL via OROMUCOSAL

## 2019-03-03 MED ORDER — POTASSIUM PHOSPHATE MONOBASIC 500 MG PO TABS
1000.0000 mg | ORAL_TABLET | ORAL | Status: AC
  Administered 2019-03-03 (×3): 1000 mg via ORAL
  Filled 2019-03-03 (×4): qty 2

## 2019-03-03 MED ORDER — ONDANSETRON HCL 4 MG/2ML IJ SOLN
INTRAMUSCULAR | Status: DC | PRN
  Administered 2019-03-03: 4 mg via INTRAVENOUS

## 2019-03-03 MED ORDER — SODIUM CHLORIDE 0.9 % IV SOLN: INTRAVENOUS | Status: DC

## 2019-03-03 MED ORDER — PROPOFOL 10 MG/ML IV BOLUS
INTRAVENOUS | Status: DC | PRN
  Administered 2019-03-03: 70 mg via INTRAVENOUS

## 2019-03-03 MED ORDER — FAMOTIDINE IN NACL 20-0.9 MG/50ML-% IV SOLN
20.0000 mg | Freq: Two times a day (BID) | INTRAVENOUS | Status: DC
  Administered 2019-03-03: 20 mg via INTRAVENOUS
  Filled 2019-03-03: qty 50

## 2019-03-03 MED ORDER — CHLORHEXIDINE GLUCONATE 0.12% ORAL RINSE (MEDLINE KIT)
15.0000 mL | Freq: Two times a day (BID) | OROMUCOSAL | Status: DC
  Administered 2019-03-03 – 2019-03-05 (×4): 15 mL via OROMUCOSAL

## 2019-03-03 NOTE — Op Note (Addendum)
[facility] Gastroenterology Patient Name: Jamie Arias Procedure Date: 03/03/2019 4:01 PM MRN: [id number] Account #: [id number] Date of Birth: 1955/11/18 Admit Type: Inpatient Age: 63 Room: [facility] ENDO ROOM 3 Gender: Female Note Status: Finalized Procedure:             Upper GI endoscopy Indications:           Place PEG because patient is unable to eat Providers:             Bena Kobel B. Bonna Gains MD, MD, [name] MD, MD (Dr.                         Allen Norris assisted in the abdominal incision part of the                         procedure) Referring MD:          Angela Adam. Caryl Bis (Referring MD) Medicines:             Monitored Anesthesia Care Complications:         No immediate complications. Procedure:             Pre-Anesthesia Assessment:                        - Prior to the procedure, a History and Physical was                         performed, and patient medications, allergies and                         sensitivities were reviewed. The patient's tolerance                         of previous anesthesia was reviewed.                        - The risks and benefits of the procedure and the                         sedation options and risks were discussed with the                         patient. All questions were answered and informed                         consent was obtained.                        - Patient identification and proposed procedure were                         verified prior to the procedure by the physician, the                         nurse, the anesthesiologist, the anesthetist and the                         technician. The procedure was verified in the  procedure room.                        - ASA Grade Assessment: III - A patient with severe                         systemic disease.                        After obtaining informed consent, the endoscope was                         passed under direct vision.  Throughout the procedure,                         the patient's blood pressure, pulse, and oxygen                         saturations were monitored continuously. The Endoscope                         was introduced through the mouth, and advanced to the                         second part of duodenum. The upper GI endoscopy was                         accomplished with ease. The patient tolerated the                         procedure well. Findings:      The examined esophagus was normal.      The entire examined stomach was normal. The patient was placed in the       supine position for PEG placement. The stomach was insufflated to appose       gastric and abdominal walls. A site was located in the body of the       stomach with excellent transillumination and manual external pressure       for placement. The abdominal wall was marked and prepped in a sterile       manner. The area was anesthetized with 0.5% lidocaine. The trocar needle       was introduced through the abdominal wall and into the stomach under       direct endoscopic view. A snare was introduced through the endoscope and       opened in the gastric lumen. The guide wire was passed through the       trocar and into the open snare. The snare was closed around the guide       wire. The endoscope and snare were removed, pulling the wire out through       the mouth. A skin incision was made at the site of needle insertion. The       externally removable 20 Fr EndoVive Safety gastrostomy tube was       lubricated. The G-tube was tied to the guide wire and pulled through the       mouth and into the stomach. The trocar needle was removed, and the       gastrostomy tube was pulled out from the stomach through the skin. The  external bumper was attached to the gastrostomy tube, and the tube was       cut to remove the guide wire. The final position of the gastrostomy tube       was confirmed by relook endoscopy, and skin  marking noted to be 3.5 cm       at the external bumper. The final tension and compression of the       abdominal wall by the PEG tube and external bumper were checked and       revealed that the bumper was loose and lightly touching the skin. The       feeding tube was capped, and the tube site cleaned and dressed.      The duodenal bulb, second portion of the duodenum and examined duodenum       were normal. Impression:            - Normal esophagus.                        - Normal stomach.                        - Normal duodenal bulb, second portion of the duodenum                         and examined duodenum.                        - An externally removable PEG placement was                         successfully completed.                        - No specimens collected. Recommendation:        - Please follow the post-PEG recommendations                         including: external bolster 1 cm from abdominal wall,                         change dressing once per day, may use PEG today for                         meds and water, may use PEG tomorrow for feedings, use                         PEG today after checked by physician, flush PEG daily                         with 60 ml water, clean site with soap and water daily                         and dry thoroughly and clean site daily with                         half-strength hydrogen peroxide for three days, then                         soap and water  daily.                        - Continue present medications.                        - Patient has a contact number available for                         emergencies. The signs and symptoms of potential                         delayed complications were discussed with the patient.                         Return to normal activities tomorrow. Written                         discharge instructions were provided to the patient.                        - The findings and recommendations  were discussed with                         the patient's family.                        - I spoke to pharmacy and per the pharmacist since                         patient is therapeutic on her vancomycin level with                         the dose she is receiving on MWF, she doesnt need an                         extra dose today for surgical prophylaxis. Procedure Code(s):     --- Professional ---                        [phone], Esophagogastroduodenoscopy, flexible,                         transoral; with directed placement of percutaneous                         gastrostomy tube Diagnosis Code(s):     --- Professional ---                        R63.3, Feeding difficulties                        Z43.1, Encounter for attention to gastrostomy CPT copyright 2019 [employer]. All rights reserved. The codes documented in this report are preliminary and upon coder review may  be revised to meet current compliance requirements.  Vonda Antigua, MD Margretta Sidle B. Bonna Gains MD, MD 03/03/2019 4:53:51 PM This report has been signed electronically. [name] MD, MD Number of Addenda: 0 Note Initiated On: 03/03/2019 4:01 PM Estimated Blood Loss:  Estimated blood loss: none.  [facility]

## 2019-03-03 NOTE — Progress Notes (Signed)
[facility]  ROUNDING NOTE   Subjective:   Patient remains chronically ill Opens eyes to voice, otherwise no other response.  Did not follow commands Tube feeds are on hold today  Objective:  Vital signs in last 24 hours:  Temp:  [98.9 F (37.2 C)-99.7 F (37.6 C)] 99.7 F (37.6 C) (12/29 0339) Pulse Rate:  [78-107] 91 (12/29 0339) Resp:  [16-25] 23 (12/29 0339) BP: (91-149)/(39-62) 134/62 (12/29 0339) SpO2:  [98 %-100 %] 100 % (12/29 0339)  Weight change:  Filed Weights   02/25/19 0842 02/25/19 1200 03/02/19 0930  Weight: 74.1 kg 73.2 kg 75.5 kg    Intake/Output: I/O last 3 completed shifts: In: 471.3 [I.V.:321.3; IV Piggyback:150] Out: 1400 [Other:1000; Stool:400]   Intake/Output this shift:  No intake/output data recorded.  Physical Exam: General: NAD, laying in bed  Head: NGT  Eyes: Anicteric  Lungs:  Clear to auscultation, room air  Heart: Regular rate and rhythm  Abdomen:  Soft, nontender,   Extremities: + peripheral edema b/l, feet in b/l soft support  Neurologic: Lethargic, opens eyes to verbal stimuli  Access: Right AVG, Left IJ PC  Rectal rube in place  Basic Metabolic Panel: Recent Labs  Lab 02/27/19 0413 02/28/19 0626 03/01/19 0429 03/01/19 2014 03/02/19 0329 03/03/19 0636  NA 141 138 135  --  134* 139  K 3.0* 3.4* 4.1 4.2 4.4 4.0  CL 98 97* 96*  --  96* 101  CO2 31 29 26   --  22 26  GLUCOSE 219* 365* 256*  --  130* 125*  BUN 6* 15 21  --  28* 19  CREATININE 1.54* 2.46* 3.03*  --  3.61* 2.44*  CALCIUM 8.0* 8.0* 8.2*  --  8.3* 8.3*  MG 1.9 1.8 1.9  --  1.9 2.0  PHOS 1.0* 2.1* 1.9* 3.2 2.9 1.8*    Liver Function Tests: Recent Labs  Lab 02/26/19 0231 02/27/19 0413 02/28/19 0626 03/01/19 0429  AST 26 22 22 22   ALT 20 18 19 17   ALKPHOS 59 59 63 58  BILITOT 1.9* 1.7* 1.0 0.9  PROT 4.9* 5.0* 4.9* 4.6*  ALBUMIN 2.1*  2.1* 2.1* 2.0* 1.8*   No results for input(s): LIPASE, AMYLASE in the last 168 hours. No results for  input(s): AMMONIA in the last 168 hours.  CBC: Recent Labs  Lab 02/27/19 0413 02/28/19 0626 03/01/19 0429 03/02/19 0329 03/03/19 0636  WBC 6.8 6.6 8.3 10.4 10.0  HGB 9.1* 8.6* 8.6* 8.2* 8.9*  HCT 27.1* 25.3* 25.0* 24.1* 26.3*  MCV 87.1 87.5 87.7 87.3 88.3  PLT 170 182 184 197 172    Cardiac Enzymes: Recent Labs  Lab 03/02/19 0330  CKTOTAL 31*    BNP: Invalid input(s): POCBNP  CBG: Recent Labs  Lab 03/02/19 1647 03/02/19 2034 03/03/19 0107 03/03/19 0407 03/03/19 0742  GLUCAP 149* 164* 153* 114* 101*    Microbiology: Results for orders placed or performed during the hospital encounter of 02/09/19  Culture, blood (routine x 2)     Status: Abnormal   Collection Time: 02/09/19  2:29 PM   Specimen: BLOOD LEFT ARM  Result Value Ref Range Status   Specimen Description   Final    BLOOD LEFT ARM Performed at [facility], [address] [place], [place] [address]    Special Requests   Final    BOTTLES DRAWN AEROBIC AND ANAEROBIC Blood Culture adequate volume Performed at [facility], [address], [place], [place] [address]  Culture  Setup Time   Final    GRAM POSITIVE COCCI IN BOTH AEROBIC AND ANAEROBIC BOTTLES CRITICAL VALUE NOTED.  VALUE IS CONSISTENT WITH PREVIOUSLY REPORTED AND CALLED VALUE. Performed at [facility], [address] [place], [place] [address]    Culture (A)  Final    STAPHYLOCOCCUS AUREUS SUSCEPTIBILITIES PERFORMED ON PREVIOUS CULTURE WITHIN THE LAST 5 DAYS. Performed at [facility], [place] [address] [place], [place] [address]    Report Status 02/12/2019 FINAL  Final  Culture, blood (routine x 2)     Status: Abnormal   Collection Time: 02/09/19  2:38 PM   Specimen: BLOOD LEFT ARM  Result Value Ref Range Status   Specimen Description   Final    BLOOD LEFT ARM Performed at [facility], [address] [place], [place] [address]    Special Requests   Final    BOTTLES DRAWN AEROBIC  AND ANAEROBIC Blood Culture adequate volume Performed at [facility], [address] [place], [place] [address]    Culture  Setup Time   Final    GRAM POSITIVE COCCI IN BOTH AEROBIC AND ANAEROBIC BOTTLES CRITICAL RESULT CALLED TO, READ BACK BY AND VERIFIED WITH: [place] [id number] 02/10/2019 HNM Performed at [facility], [place] [address] [place], [place] [address]    Culture METHICILLIN RESISTANT STAPHYLOCOCCUS AUREUS (A)  Final   Report Status 02/12/2019 FINAL  Final   Organism ID, Bacteria METHICILLIN RESISTANT STAPHYLOCOCCUS AUREUS  Final      Susceptibility   Methicillin resistant staphylococcus aureus - MIC*    CIPROFLOXACIN >=8 RESISTANT Resistant     ERYTHROMYCIN >=8 RESISTANT Resistant     GENTAMICIN <=0.5 SENSITIVE Sensitive     OXACILLIN >=4 RESISTANT Resistant     TETRACYCLINE <=1 SENSITIVE Sensitive     VANCOMYCIN 1 SENSITIVE Sensitive     TRIMETH/SULFA <=10 SENSITIVE Sensitive     CLINDAMYCIN RESISTANT Resistant     RIFAMPIN <=0.5 SENSITIVE Sensitive     Inducible Clindamycin POSITIVE Resistant     * METHICILLIN RESISTANT STAPHYLOCOCCUS AUREUS  Blood Culture ID Panel (Reflexed)     Status: Abnormal   Collection Time: 02/09/19  2:38 PM  Result Value Ref Range Status   Enterococcus species NOT DETECTED NOT DETECTED Final   Listeria monocytogenes NOT DETECTED NOT DETECTED Final   Staphylococcus species DETECTED (A) NOT DETECTED Final    Comment: CRITICAL RESULT CALLED TO, READ BACK BY AND VERIFIED WITH: Hart Robinsons PHARMD [id number] 02/10/2019 HNM    Staphylococcus aureus (BCID) DETECTED (A) NOT DETECTED Final    Comment: Methicillin (oxacillin)-resistant Staphylococcus aureus (MRSA). MRSA is predictably resistant to beta-lactam antibiotics (except ceftaroline). Preferred therapy is vancomycin unless clinically contraindicated. Patient requires contact precautions if  hospitalized. CRITICAL RESULT CALLED TO, READ BACK BY AND VERIFIED WITH: Hart Robinsons  PHARMD [id number] 02/10/2019 HNM    Methicillin resistance DETECTED (A) NOT DETECTED Final    Comment: CRITICAL RESULT CALLED TO, READ BACK BY AND VERIFIED WITH: Hart Robinsons PHARMD [id number] 02/10/2019 HNM    Streptococcus species NOT DETECTED NOT DETECTED Final   Streptococcus agalactiae NOT DETECTED NOT DETECTED Final   Streptococcus pneumoniae NOT DETECTED NOT DETECTED Final   Streptococcus pyogenes NOT DETECTED NOT DETECTED Final   Acinetobacter baumannii NOT DETECTED NOT DETECTED Final   Enterobacteriaceae species NOT DETECTED NOT DETECTED Final   Enterobacter cloacae complex NOT DETECTED NOT DETECTED Final   Escherichia coli NOT DETECTED NOT DETECTED Final   Klebsiella  oxytoca NOT DETECTED NOT DETECTED Final   Klebsiella pneumoniae NOT DETECTED NOT DETECTED Final   Proteus species NOT DETECTED NOT DETECTED Final   Serratia marcescens NOT DETECTED NOT DETECTED Final   Haemophilus influenzae NOT DETECTED NOT DETECTED Final   Neisseria meningitidis NOT DETECTED NOT DETECTED Final   Pseudomonas aeruginosa NOT DETECTED NOT DETECTED Final   Candida albicans NOT DETECTED NOT DETECTED Final   Candida glabrata NOT DETECTED NOT DETECTED Final   Candida krusei NOT DETECTED NOT DETECTED Final   Candida parapsilosis NOT DETECTED NOT DETECTED Final   Candida tropicalis NOT DETECTED NOT DETECTED Final    Comment: Performed at [facility], [place], [place] [address]  SARS CORONAVIRUS 2 (TAT 6-24 HRS) Nasopharyngeal Nasopharyngeal Swab     Status: None   Collection Time: 02/09/19  2:54 PM   Specimen: Nasopharyngeal Swab  Result Value Ref Range Status   SARS Coronavirus 2 NEGATIVE NEGATIVE Final    Comment: (NOTE) SARS-CoV-2 target nucleic acids are NOT DETECTED. The SARS-CoV-2 RNA is generally detectable in upper and lower respiratory specimens during the acute phase of infection. Negative results do not preclude SARS-CoV-2 infection, do not rule out co-infections with other  pathogens, and should not be used as the sole basis for treatment or other patient management decisions. Negative results must be combined with clinical observations, patient history, and epidemiological information. The expected result is Negative. Fact Sheet for Patients: SugarRoll.be Fact Sheet for Healthcare Providers: https://www.woods-mathews.com/ This test is not yet approved or cleared by the [place] FDA and  has been authorized for detection and/or diagnosis of SARS-CoV-2 by FDA under an Emergency Use Authorization (EUA). This EUA will remain  in effect (meaning this test can be used) for the duration of the COVID-19 declaration under Section 56 4(b)(1) of the Act, 21 U.S.C. section 360bbb-3(b)(1), unless the authorization is terminated or revoked sooner. Performed at [facility], [place] [address], [place], [place] [address]   MRSA PCR Screening     Status: Abnormal   Collection Time: 02/10/19  5:03 PM   Specimen: Nasopharyngeal  Result Value Ref Range Status   MRSA by PCR POSITIVE (A) NEGATIVE Final    Comment:        The GeneXpert MRSA Assay (FDA approved for NASAL specimens only), is one component of a comprehensive MRSA colonization surveillance program. It is not intended to diagnose MRSA infection nor to guide or monitor treatment for MRSA infections. RESULT CALLED TO, READ BACK BY AND VERIFIED WITH: ALEKSEY FRASER @1839  02/10/19 MJU Performed at [facility], [address] [place], [place] [address]   Culture, blood (routine x 2)     Status: Abnormal   Collection Time: 02/11/19 12:31 PM   Specimen: BLOOD  Result Value Ref Range Status   Specimen Description   Final    BLOOD PORTA CATH Performed at [place] [name], [place], [place] [address]    Special Requests   Final    BOTTLES DRAWN AEROBIC AND ANAEROBIC Blood Culture adequate volume Performed at [facility],  [address] [place], [place] [address]    Culture  Setup Time   Final    GRAM POSITIVE COCCI ANAEROBIC BOTTLE ONLY CRITICAL RESULT CALLED TO, READ BACK BY AND VERIFIED WITH: SCOTT HALL AT [id number] 02/12/2019 [facility]  Performed at [facility], [address] [place],  [address]    Culture (A)  Final    STAPHYLOCOCCUS AUREUS  SUSCEPTIBILITIES PERFORMED ON PREVIOUS CULTURE WITHIN THE LAST 5 DAYS. Performed at [facility], [place] [address] [place], [place] [address]    Report Status 02/13/2019 FINAL  Final  Culture, blood (routine x 2)     Status: None   Collection Time: 02/11/19  3:57 PM   Specimen: BLOOD  Result Value Ref Range Status   Specimen Description BLOOD PORTA CATH  Final   Special Requests   Final    BOTTLES DRAWN AEROBIC AND ANAEROBIC Blood Culture adequate volume   Culture   Final    NO GROWTH 5 DAYS Performed at [facility], [address] [place], [place] [address]    Report Status 02/16/2019 FINAL  Final  CULTURE, BLOOD (ROUTINE X 2) w Reflex to ID Panel     Status: Abnormal   Collection Time: 02/13/19  2:18 PM   Specimen: BLOOD LEFT HAND  Result Value Ref Range Status   Specimen Description BLOOD LEFT HAND  Final   Special Requests   Final    BOTTLES DRAWN AEROBIC AND ANAEROBIC Blood Culture results may not be optimal due to an inadequate volume of blood received in culture bottles   Culture  Setup Time   Final    AEROBIC BOTTLE ONLY GRAM POSITIVE COCCI IN CLUSTERS CRITICAL RESULT CALLED TO, READ BACK BY AND VERIFIED WITH: Herbert Pun AT [id number] ON 02/14/2019 [place]. Performed at [facility], [place] [address] [place], [place] [address]    Culture METHICILLIN RESISTANT STAPHYLOCOCCUS AUREUS (A)  Final   Report Status 02/16/2019 FINAL  Final   Organism ID, Bacteria METHICILLIN RESISTANT STAPHYLOCOCCUS AUREUS  Final      Susceptibility   Methicillin resistant staphylococcus aureus - MIC*    CIPROFLOXACIN >=8 RESISTANT  Resistant     ERYTHROMYCIN >=8 RESISTANT Resistant     GENTAMICIN <=0.5 SENSITIVE Sensitive     OXACILLIN >=4 RESISTANT Resistant     TETRACYCLINE <=1 SENSITIVE Sensitive     VANCOMYCIN <=0.5 SENSITIVE Sensitive     TRIMETH/SULFA <=10 SENSITIVE Sensitive     CLINDAMYCIN RESISTANT Resistant     RIFAMPIN <=0.5 SENSITIVE Sensitive     Inducible Clindamycin POSITIVE Resistant     * METHICILLIN RESISTANT STAPHYLOCOCCUS AUREUS  Blood Culture ID Panel (Reflexed)     Status: Abnormal   Collection Time: 02/13/19  2:18 PM  Result Value Ref Range Status   Enterococcus species NOT DETECTED NOT DETECTED Final   Listeria monocytogenes NOT DETECTED NOT DETECTED Final   Staphylococcus species DETECTED (A) NOT DETECTED Final    Comment: CRITICAL RESULT CALLED TO, READ BACK BY AND VERIFIED WITH: ABBEY ELLINGTON AT [id number] ON 02/14/2019 [place].    Staphylococcus aureus (BCID) DETECTED (A) NOT DETECTED Final    Comment: Methicillin (oxacillin)-resistant Staphylococcus aureus (MRSA). MRSA is predictably resistant to beta-lactam antibiotics (except ceftaroline). Preferred therapy is vancomycin unless clinically contraindicated. Patient requires contact precautions if  hospitalized. CRITICAL RESULT CALLED TO, READ BACK BY AND VERIFIED WITH: Herbert Pun AT [id number] ON 02/14/2019 [place].    Methicillin resistance DETECTED (A) NOT DETECTED Final    Comment: CRITICAL RESULT CALLED TO, READ BACK BY AND VERIFIED WITH: Herbert Pun AT [id number] ON 02/14/2019 [place].    Streptococcus species NOT DETECTED NOT DETECTED Final   Streptococcus agalactiae NOT DETECTED NOT DETECTED Final   Streptococcus pneumoniae NOT DETECTED NOT DETECTED Final   Streptococcus pyogenes NOT DETECTED NOT DETECTED Final   Acinetobacter baumannii NOT DETECTED NOT DETECTED Final   Enterobacteriaceae species  NOT DETECTED NOT DETECTED Final   Enterobacter cloacae complex NOT DETECTED NOT DETECTED Final   Escherichia coli NOT DETECTED NOT DETECTED Final    Klebsiella oxytoca NOT DETECTED NOT DETECTED Final   Klebsiella pneumoniae NOT DETECTED NOT DETECTED Final   Proteus species NOT DETECTED NOT DETECTED Final   Serratia marcescens NOT DETECTED NOT DETECTED Final   Haemophilus influenzae NOT DETECTED NOT DETECTED Final   Neisseria meningitidis NOT DETECTED NOT DETECTED Final   Pseudomonas aeruginosa NOT DETECTED NOT DETECTED Final   Candida albicans NOT DETECTED NOT DETECTED Final   Candida glabrata NOT DETECTED NOT DETECTED Final   Candida krusei NOT DETECTED NOT DETECTED Final   Candida parapsilosis NOT DETECTED NOT DETECTED Final   Candida tropicalis NOT DETECTED NOT DETECTED Final    Comment: Performed at [facility], [address] [place], [place] [address]  CULTURE, BLOOD (ROUTINE X 2) w Reflex to ID Panel     Status: None   Collection Time: 02/13/19  3:09 PM   Specimen: BLOOD  Result Value Ref Range Status   Specimen Description BLOOD BLOOD LEFT HAND  Final   Special Requests   Final    BOTTLES DRAWN AEROBIC AND ANAEROBIC Blood Culture adequate volume   Culture   Final    NO GROWTH 5 DAYS Performed at [facility], [address], [place], [place] [address]    Report Status 02/18/2019 FINAL  Final  Culture, blood (routine x 2)     Status: Abnormal   Collection Time: 02/15/19 12:49 PM   Specimen: BLOOD  Result Value Ref Range Status   Specimen Description   Final    BLOOD LT HAND Performed at [facility], [address] [place], [place] [address]    Special Requests   Final    BOTTLES DRAWN AEROBIC AND ANAEROBIC Blood Culture results may not be optimal due to an inadequate volume of blood received in culture bottles Performed at [facility], [address] [place], [place] [address]    Culture  Setup Time   Final    GRAM POSITIVE COCCI IN BOTH AEROBIC AND ANAEROBIC BOTTLES CRITICAL RESULT CALLED TO, READ BACK BY AND VERIFIED WITHEleonore Chiquito AT [id number] 02/16/2019  SDR Performed at [facility], [address] [place], [place] [address]    Culture (A)  Final    METHICILLIN RESISTANT STAPHYLOCOCCUS AUREUS STAPHYLOCOCCUS SPECIES (COAGULASE NEGATIVE) THE SIGNIFICANCE OF ISOLATING THIS ORGANISM FROM A SINGLE SET OF BLOOD CULTURES WHEN MULTIPLE SETS ARE DRAWN IS UNCERTAIN. PLEASE NOTIFY THE MICROBIOLOGY DEPARTMENT WITHIN ONE WEEK IF SPECIATION AND SENSITIVITIES ARE REQUIRED. Performed at [facility], [place] [address] [place],  [address]    Report Status 02/18/2019 FINAL  Final   Organism ID, Bacteria METHICILLIN RESISTANT STAPHYLOCOCCUS AUREUS  Final      Susceptibility   Methicillin resistant staphylococcus aureus - MIC*    CIPROFLOXACIN >=8 RESISTANT Resistant     ERYTHROMYCIN >=8 RESISTANT Resistant     GENTAMICIN <=0.5 SENSITIVE Sensitive     OXACILLIN >=4 RESISTANT Resistant     TETRACYCLINE <=1 SENSITIVE Sensitive     VANCOMYCIN <=0.5 SENSITIVE Sensitive     TRIMETH/SULFA <=10 SENSITIVE Sensitive     CLINDAMYCIN RESISTANT Resistant     RIFAMPIN <=0.5 SENSITIVE Sensitive     Inducible Clindamycin POSITIVE Resistant     * METHICILLIN RESISTANT STAPHYLOCOCCUS AUREUS  Aerobic Culture (superficial specimen)     Status: None   Collection Time: 02/17/19  1:31 PM   Specimen: Heel; Wound  Result Value Ref Range Status   Specimen Description   Final    HEEL Performed at [facility], [address] [place], [place] [address]    Special Requests   Final    NONE Performed at [facility], [address] [place], [place] [address]    Gram Stain   Final    NO WBC SEEN MODERATE GRAM POSITIVE COCCI IN PAIRS    Culture   Final    ABUNDANT METHICILLIN RESISTANT STAPHYLOCOCCUS AUREUS ABUNDANT DIPHTHEROIDS(CORYNEBACTERIUM SPECIES) Standardized susceptibility testing for this organism is not available. Performed at [facility], [place] [address] [place], [place] [address]    Report Status 02/19/2019  FINAL  Final   Organism ID, Bacteria METHICILLIN RESISTANT STAPHYLOCOCCUS AUREUS  Final      Susceptibility   Methicillin resistant staphylococcus aureus - MIC*    CIPROFLOXACIN >=8 RESISTANT Resistant     ERYTHROMYCIN >=8 RESISTANT Resistant     GENTAMICIN <=0.5 SENSITIVE Sensitive     OXACILLIN >=4 RESISTANT Resistant     TETRACYCLINE <=1 SENSITIVE Sensitive     VANCOMYCIN 1 SENSITIVE Sensitive     TRIMETH/SULFA <=10 SENSITIVE Sensitive     CLINDAMYCIN RESISTANT Resistant     RIFAMPIN <=0.5 SENSITIVE Sensitive     Inducible Clindamycin POSITIVE Resistant     * ABUNDANT METHICILLIN RESISTANT STAPHYLOCOCCUS AUREUS  CULTURE, BLOOD (ROUTINE X 2) w Reflex to ID Panel     Status: None   Collection Time: 02/18/19 11:29 PM   Specimen: BLOOD  Result Value Ref Range Status   Specimen Description BLOOD LEFT ARM  Final   Special Requests   Final    BOTTLES DRAWN AEROBIC AND ANAEROBIC Blood Culture adequate volume   Culture   Final    NO GROWTH 5 DAYS Performed at [facility] [address] [place], [place] [address]    Report Status 02/23/2019 FINAL  Final  CULTURE, BLOOD (ROUTINE X 2) w Reflex to ID Panel     Status: None   Collection Time: 02/18/19 11:29 PM   Specimen: BLOOD  Result Value Ref Range Status   Specimen Description BLOOD LEFT ARM  Final   Special Requests   Final    BOTTLES DRAWN AEROBIC AND ANAEROBIC Blood Culture adequate volume   Culture   Final    NO GROWTH 5 DAYS Performed at [facility], [address], [place], [place] [address]    Report Status 02/23/2019 FINAL  Final    Coagulation Studies: No results for input(s): LABPROT, INR in the last 72 hours.  Urinalysis: No results for input(s): COLORURINE, LABSPEC, PHURINE, GLUCOSEU, HGBUR, BILIRUBINUR, KETONESUR, PROTEINUR, UROBILINOGEN, NITRITE, LEUKOCYTESUR in the last 72 hours.  Invalid input(s): APPERANCEUR    Imaging: No results found.   Medications:   . sodium chloride  Stopped (03/01/19 0429)  . sodium chloride    . vancomycin Stopped (03/02/19 1752)   . aspirin  81 mg Oral Daily  . B-complex with vitamin C  1 tablet Per Tube Daily  . chlorhexidine  15 mL Mouth Rinse BID  . Chlorhexidine Gluconate Cloth  6 each Topical Q0600  . epoetin (EPOGEN/PROCRIT) injection  4,000 Units Intravenous Q M,W,F-HD  . fluticasone  2 spray Each Nare Daily  . free water  30 mL Per Tube Q4H  . insulin aspart  0-15 Units Subcutaneous Q4H  . insulin aspart  3 Units Subcutaneous Q4H  . insulin glargine  10 Units Subcutaneous Daily  . levothyroxine  88 mcg Oral QAC breakfast  . mouth rinse  15 mL Mouth Rinse q12n4p  . midodrine  10 mg Oral TID WC  . pantoprazole (PROTONIX) IV  40 mg Intravenous Q24H  . potassium phosphate (monobasic)  1,000 mg Oral Q4H  . venlafaxine  75 mg Oral BID WC  . zinc sulfate  220 mg Oral Daily   sodium chloride, sodium chloride, haloperidol lactate, ondansetron (ZOFRAN) IV  Assessment/ Plan:  [name] is a 63 y.o. white female with end-stage renal disease on hemodialysis, atrial fibrillation, chronic diastolic congestive heart failure, coronary artery disease status post CABG, diabetes mellitus type II, hypertension, hyperlipidemia, hypothyroidism, Covid positive in July 2020, recent nursing home stay in [place] Prolonged hospitalization for MRSA bacteremia/sepsis and now with acute encephalopathy  [facility] MWF [facility] [address] Left AVG 81.5kg  1. End Stage Renal Disease: on hemodialysis. With complication of dialysis device.  There is a wound on the right arm close to AV graft.  Angiogram on 12/21 shows no communication of the wound with AV graft.  Avoid cannulation of the graft until wound has healed.  In the meantime, patient is getting dialysis via left chest PermCath -  MWF schedule.   2. Hypotension - midodrine 10 mg TID  3. Anemia of chronic kidney disease:   Status post PRBC transfusion on 12/22 - EPO with HD treatments.    4. Secondary Hyperparathyroidism:  not currently on binders.  Lab Results  Component Value Date   PTH 135 (H) 02/22/2017   CALCIUM 8.3 (L) 03/03/2019   CAION 1.20 07/03/2013   PHOS 1.8 (L) 03/03/2019    5. Acute Encephalopathy:  improving off ceftaroline. . Not improving  6. Sepsis from MRSA - Abx as per ID team - Source of MRSA has not been identified  7. A Fib - requiring Anticoagulation with heparin Drip, Eliquis on Hold  8. DM with CKD Insulin dependent Lab Results  Component Value Date   HGBA1C 5.3 02/09/2019   9. Left arm DVT Dx 12/9       LOS: 22 Jamie Arias 12/29/202010:00 AM

## 2019-03-03 NOTE — Anesthesia Post-op Follow-up Note (Signed)
Anesthesia QCDR form completed.        

## 2019-03-03 NOTE — Progress Notes (Signed)
[name], female   DOB: Jun 21, 1955, 63 y.o.   MRN: [id number] [facility] PROGRESS NOTE  Jamie Arias [id number] DOB: 05-17-1955 DOA: 02/09/2019 PCP: [name], MD  HPI/Subjective: Patient did not awaken to sternal rub today.  Unable to squeeze my hands.  Objective: Vitals:   03/03/19 0339 03/03/19 1232  BP: 134/62 (!) 127/44  Pulse: 91 77  Resp: (!) 23 16  Temp: 99.7 F (37.6 C) 98.4 F (36.9 C)  SpO2: 100% 100%    Filed Weights   02/25/19 0842 02/25/19 1200 03/02/19 0930  Weight: 74.1 kg 73.2 kg 75.5 kg    ROS: Review of Systems  Unable to perform ROS: Acuity of condition   Exam: Physical Exam  Constitutional: She appears lethargic.  HENT:  Nose: No mucosal edema.  Eyes: Pupils are equal, round, and reactive to light. Conjunctivae and lids are normal.  Neck: Carotid bruit is not present.  Cardiovascular: S1 normal and S2 normal. An irregularly irregular rhythm present. Exam reveals no gallop.  No murmur heard. Respiratory: No respiratory distress. She has decreased breath sounds in the right lower field and the left lower field. She has no wheezes. She has no rhonchi. She has no rales.  GI: Soft. Bowel sounds are normal. There is no abdominal tenderness.  Musculoskeletal:     Right ankle: No swelling.     Left ankle: No swelling.  Lymphadenopathy:    She has no cervical adenopathy.  Neurological: She appears lethargic.  Unresponsive to sternal rub today  Skin: Skin is warm. Nails show no clubbing.  Left heel stage II decubitus ulcer with black eschar. Right heel stage II decubitus ulcer As per nursing staff stage I decubitus ulcer sacrum  Psychiatric:  Unresponsive to sternal rub today      Data Reviewed: Basic Metabolic Panel: Recent Labs  Lab 02/27/19 0413 02/28/19 0626 03/01/19 0429 03/01/19 2014 03/02/19 0329 03/03/19 0636  NA 141 138 135  --  134* 139  K 3.0* 3.4* 4.1 4.2 4.4 4.0  CL 98 97* 96*  --  96*  101  CO2 31 29 26   --  22 26  GLUCOSE 219* 365* 256*  --  130* 125*  BUN 6* 15 21  --  28* 19  CREATININE 1.54* 2.46* 3.03*  --  3.61* 2.44*  CALCIUM 8.0* 8.0* 8.2*  --  8.3* 8.3*  MG 1.9 1.8 1.9  --  1.9 2.0  PHOS 1.0* 2.1* 1.9* 3.2 2.9 1.8*   Liver Function Tests: Recent Labs  Lab 02/26/19 0231 02/27/19 0413 02/28/19 0626 03/01/19 0429  AST 26 22 22 22   ALT 20 18 19 17   ALKPHOS 59 59 63 58  BILITOT 1.9* 1.7* 1.0 0.9  PROT 4.9* 5.0* 4.9* 4.6*  ALBUMIN 2.1*  2.1* 2.1* 2.0* 1.8*   CBC: Recent Labs  Lab 02/27/19 0413 02/28/19 0626 03/01/19 0429 03/02/19 0329 03/03/19 0636  WBC 6.8 6.6 8.3 10.4 10.0  HGB 9.1* 8.6* 8.6* 8.2* 8.9*  HCT 27.1* 25.3* 25.0* 24.1* 26.3*  MCV 87.1 87.5 87.7 87.3 88.3  PLT 170 182 184 197 172   Cardiac Enzymes: Recent Labs  Lab 03/02/19 0330  CKTOTAL 31*   BNP (last 3 results) Recent Labs    03/01/19 0429  BNP 720.0*     CBG: Recent Labs  Lab 03/02/19 2034 03/03/19 0107 03/03/19 0407 03/03/19 0742 03/03/19 1158  GLUCAP 164* 153* 114* 101* 112*     Studies: No results found.  Scheduled Meds: . aspirin  81 mg Oral Daily  . B-complex with vitamin C  1 tablet Per Tube Daily  . chlorhexidine  15 mL Mouth Rinse BID  . Chlorhexidine Gluconate Cloth  6 each Topical Q0600  . epoetin (EPOGEN/PROCRIT) injection  4,000 Units Intravenous Q M,W,F-HD  . fluticasone  2 spray Each Nare Daily  . free water  30 mL Per Tube Q4H  . insulin aspart  0-15 Units Subcutaneous Q4H  . insulin aspart  3 Units Subcutaneous Q4H  . insulin glargine  10 Units Subcutaneous Daily  . levothyroxine  88 mcg Oral QAC breakfast  . mouth rinse  15 mL Mouth Rinse q12n4p  . midodrine  10 mg Oral TID WC  . pantoprazole (PROTONIX) IV  40 mg Intravenous Q24H  . potassium phosphate (monobasic)  1,000 mg Oral Q4H  . venlafaxine  75 mg Oral BID WC  . zinc sulfate  220 mg Oral Daily   Continuous Infusions: . sodium chloride Stopped (03/01/19 0429)  . sodium  chloride    . vancomycin Stopped (03/02/19 1752)    Assessment/Plan:   1. Sepsis secondary to MRSA bacteremia.  Currently on vancomycin.   Appreciate ID consultation.  Will need long course of IV therapy. 2. End-stage renal disease on hemodialysis.  Tunneled catheter placed by vascular.  Graft removal tomorrow by vascular 3. Acute metabolic encephalopathy.  Mental status still impaired.  MRI of the brain negative.  EEG negative.  Ammonia normal.  TSH normal.  In speaking with prior rounding physicians patient's mental status has been impaired for a while now.  Overall prognosis poor.  Patient is total care. 4. Left arm DVT.  Heparin drip on hold for PEG today.  Restart after procedure. 5. Paroxysmal atrial  fibrillation on amiodarone.  Eliquis on hold.  Heparin drip holding for PEG tube today.  Patient will have graft removal tomorrow.  Restart anticoagulation as soon as possible. 6. Chronic diastolic congestive heart failure.  Dialysis to manage fluid status. 7. Type 2 diabetes mellitus on low-dose Lantus.  We will hold short acting insulin and Lantus now at this point since tube feeds are on hold for PEG. 8. Hypothyroidism unspecified on Synthroid 9. Overall prognosis is poor.  Palliative care following.  Patient a DNR. 10. Diarrhea.  Hold off on stool studies at this time.  11. Stage II decubiti right heel, left heel and stage I decubiti on buttock 12. Nutrition: PEG to be placed later this afternoon  Code Status:     Code Status Orders  (From admission, onward)         Start     Ordered   02/21/19 0930  Do not attempt resuscitation (DNR)  Continuous    Question Answer Comment  In the event of cardiac or respiratory ARREST Do not call a "code blue"   In the event of cardiac or respiratory ARREST Do not perform Intubation, CPR, defibrillation or ACLS   In the event of cardiac or respiratory ARREST Use medication by any route, position, wound care, and other measures to relive pain  and suffering. May use oxygen, suction and manual treatment of airway obstruction as needed for comfort.      02/21/19 0932        Code Status History    Date Active Date Inactive Code Status Order ID Comments User Context   02/09/2019 1755 02/21/2019 0932 Full Code [id number]  Thornell Mule, MD ED   08/24/2018 0022 09/15/2018 1720 Full Code [id number]  Shela Leff, MD Inpatient   09/04/2017 1510 09/06/2017 1920 Full Code [id number]  Epifanio Lesches, MD ED   02/15/2017 1243 02/16/2017 2023 Full Code [id number]  Saundra Shelling, MD Inpatient   10/23/2015 0828 10/23/2015 0908 Full Code [id number]  Saundra Shelling, MD Inpatient   09/03/2015 0130 09/03/2015 1619 Full Code [id number]  Lance Coon, MD Inpatient   07/02/2013 1327 07/14/2013 1540 Full Code [id number]  Nani Skillern, PA-C Inpatient   06/29/2013 2153 07/02/2013 1327 Full Code [id number]  Larey Dresser, MD Inpatient   Advance Care Planning Activity     Family Communication: Spoke with husband at the bedside.  Our conversations were more with quality of life.  With her not regaining mental status her quality of life is very poor. Disposition Plan: Transitional care team to look into LTAC facility  Consultants:  Nephrology  Infectious disease  Antibiotics:  Vancomycin  Time spent: 26 minutes  Shoshana Johal [employer]

## 2019-03-03 NOTE — Transfer of Care (Signed)
Immediate Anesthesia Transfer of Care Note  Patient: Jamie Arias  Procedure(s) Performed: PERCUTANEOUS ENDOSCOPIC GASTROSTOMY (PEG) PLACEMENT (N/A )  Patient Location: PACU  Anesthesia Type:General  Level of Consciousness: sedated, unresponsive and Patient remains intubated per anesthesia plan  Airway & Oxygen Therapy: Patient remains intubated per anesthesia plan and Patient placed on Ventilator (see vital sign flow sheet for setting)  Post-op Assessment: Report given to RN and Post -op Vital signs reviewed and stable  Post vital signs: Reviewed and stable  Last Vitals:  Vitals Value Taken Time  BP 98/31 03/03/19 1716  Temp 36.6 C 03/03/19 1716  Pulse 111 03/03/19 1716  Resp 18 03/03/19 1716  SpO2 100 % 03/03/19 1716  Vitals shown include unvalidated device data.  Last Pain:  Vitals:   03/03/19 1546  TempSrc: Temporal  PainSc:          Complications: No apparent anesthesia complications.

## 2019-03-03 NOTE — Consult Note (Signed)
ANTICOAGULATION CONSULT NOTE  Pharmacy Consult for heparin Indication: DVT  Patient Measurements: Height: 5\' 4"  (162.6 cm) Weight: 166 lb 7.2 oz (75.5 kg) IBW/kg (Calculated) : 54.7 Heparin Dosing Weight: 71 kg  Vital Signs: Temp: 99.7 F (37.6 C) (12/29 0339) Temp Source: Oral (12/29 0339) BP: 134/62 (12/29 0339) Pulse Rate: 91 (12/29 0339)  Labs: Recent Labs    03/01/19 0429 03/02/19 0329 03/02/19 0330 03/03/19 0636  HGB 8.6* 8.2*  --   --   HCT 25.0* 24.1*  --   --   PLT 184 197  --   --   HEPARINUNFRC 0.50 0.49  --  0.22*  CREATININE 3.03* 3.61*  --   --   CKTOTAL  --   --  31*  --    Estimated Creatinine Clearance: 15.5 mL/min (A) (by C-G formula based on SCr of 3.61 mg/dL (H)).  Medical History: Past Medical History:  Diagnosis Date  . Anemia   . Chronic diastolic CHF (congestive heart failure) ([place])    a. Due to ischemic cardiomyopathy. EF as low as 35%, improved to normal s/p CABG; b. echo 07/06/13: EF 55-60%, no RWMAs, mod TR, trivial pericardial effusion not c/w tamponade physiology;  c. 10/2015 Echo: EF 65%, Gr1 DD, triv AI, mild MR, mildly dil LA, mod TR, PASP 23mmHg.  Marland Kitchen Coronary artery disease    a. NSTEMI 06/2013; b.cath: severe three-vessel CAD w/ EF 30% & mild-mod MR; c. s/p 3 vessel CABG 07/02/13 (LIMA-LAD, SVG-OM, and SVG-RPDA);  d. 10/2015 MV: no ischemia/infarct.  . Diabetes mellitus without complication ([place])   . Diabetic neuropathy ([place])   . Dialysis patient [facility])    MWF  . ESRD (end stage renal disease) ([place])    a. 12/2015 initiated - mwf dialysis.  Marland Kitchen GERD (gastroesophageal reflux disease)   . Hyperlipidemia   . Hypertension   . Hypothyroidism   . Myocardial infarction ([place]) 2015  . Neuropathy   . Pleural effusion 2015  . Pulmonary hypertension ([place])   . Renal insufficiency   . Wears dentures    full lower    Medications:  Scheduled:  . aspirin  81 mg Oral Daily  . B-complex with vitamin C  1 tablet Per Tube Daily  . chlorhexidine  15  mL Mouth Rinse BID  . Chlorhexidine Gluconate Cloth  6 each Topical Q0600  . epoetin (EPOGEN/PROCRIT) injection  4,000 Units Intravenous Q M,W,F-HD  . fluticasone  2 spray Each Nare Daily  . free water  30 mL Per Tube Q4H  . insulin aspart  0-15 Units Subcutaneous Q4H  . insulin aspart  3 Units Subcutaneous Q4H  . insulin glargine  10 Units Subcutaneous Daily  . levothyroxine  88 mcg Oral QAC breakfast  . mouth rinse  15 mL Mouth Rinse q12n4p  . midodrine  10 mg Oral TID WC  . pantoprazole (PROTONIX) IV  40 mg Intravenous Q24H  . venlafaxine  75 mg Oral BID WC  . zinc sulfate  220 mg Oral Daily    Assessment: 63 y.o. female admitted on 02/09/2019 with MRSA  bacteremia. She was on apixaban PTA but last dose was prior to admission (12/7). [place] 12/15 revealed age-indeterminate short segment occlusive DVT involving the mid humeral aspect of one of the paired brachial veins.  Heparin was started but after repeated attempts by multiple phlebotomists lab was unable to draw labs required for heparin monitoring. Additionally, she was too confused and disoriented to swallow oral medications. On 12/20 a femoral central  line was placed. Heparin was stopped this morning due to a fall and is being restarted upon returning from HD  Heparin Course: 12/24 0231 HL 0.26: held due to unwitnessed fall and bleeding from arm 12/25 0413 HL 0.28: subtherapeutic - CBC stable.  Will increase Heparin to 900 units/hr and recheck in 8 hours. 12/25 1356 HL 0.47: therapeutic  12/25 2020 HL 0.36: therapeutic x 2 12/26 HL @0626 = 0.16. Will give Heparin bolus of 2100 units and increase heparin drip to 1100 units/hr  Goal of Therapy:  Heparin Level: 0.3 - 0.7 units/mL Monitor platelets by anticoagulation protocol: Yes   Plan:  12/29 @ 0540 heparin drip was stopped for PEG procedure today. Follow-up for restart of anticoagulation post procedure.  Tobie Lords, PharmD Clinical Pharmacist 03/03/2019 7:22 AM

## 2019-03-03 NOTE — Consult Note (Signed)
PULMONARY / CRITICAL CARE MEDICINE   Name: Jamie Arias MRN: [id number] DOB: 29-Oct-1955    ADMISSION DATE:  02/09/2019 CONSULTATION DATE:  03/03/2019  REFERRING MD:  Dr. Leslye Peer  CHIEF COMPLAINT:  Remains intubated post PEG placement 03/03/19  BRIEF DISCUSSION: 63 y.o. Female with PMH notable for ESRD on HD, COVID-19 infection in 09/2018, HFpEF, who was admitted 74/9/44 due to Metabolic Encephalopathy & Septic Shock secondary to MRSA Bacteremia.  Infectious disease is following.  Suspected source of Bacteremia is AV graft with stent.  On 12/29 she underwent PEG placement due to persistent encephalopathy and poor PO intake.  She returns to ICU post PEG and remains intubated, as she is to undergo AV Graft removal on 12/30.  Pt also has age indeterminate left arm DVT.  HISTORY OF PRESENT ILLNESS:   Jamie Arias is a 63 y.o. Female with a Past medical history notable for ESRD on hemodialysis, chronic HFpEF, + COVID-19 infection in 09/2018, anemia, CAD status post PCI, type 2 diabetes mellitus, hypertension, hyperlipidemia, hypothyroidism who presented to [facility] ED on 02/09/2019 due to altered mental status.  Upon presentation she was noted to be hypotensive and tachycardic.  She met sepsis criteria, therefore she received IV fluid resuscitation and broad-spectrum antibiotic coverage.  CT head without contrast was negative for any acute finding.  She was admitted to med-surg unit by Hospitalist for further workup and treatment of acute metabolic encephalopathy and sepsis.  On 02/10/2019 she became hypotensive and blood cultures with MRSA bacteremia, requiring transfer to stepdown unit for vasopressors.  PCCM and infectious disease were consulted for further management of septic shock and MRSA bacteremia due to unknown source.  She was also found to have an age intermediate Left Brachial vein DVT.  Her Septic shock resolved, and she became more awake, but remained encephalopathic secondary to sepsis.  MRI  negative for acute CVA.  Imaging of the spine was negative.  TEE was unable to be performed due to encephaloapthy.  She has a left heel decubitus ulcer that was present since admission, of which she was evaluated by Podiatry, and no osteomyelitis found.  Infectious disease suspects the source is her AV Fistula.  Given her persistent encephalopathy and poor po intake, she required placement of PEG tube on 03/03/19.  She returns to ICU post procedure and remains intubated, with plans for AV graft removal tomorrow on 03/04/19.  PCCM is consulted for vent management.  PAST MEDICAL HISTORY :  She  has a past medical history of Anemia, Chronic diastolic CHF (congestive heart failure) ([place]), Coronary artery disease, Diabetes mellitus without complication ([place]), Diabetic neuropathy ([place]), Dialysis patient ([place]), ESRD (end stage renal disease) ([place]), GERD (gastroesophageal reflux disease), Hyperlipidemia, Hypertension, Hypothyroidism, Myocardial infarction ([place]) (2015), Neuropathy, Pleural effusion (2015), Pulmonary hypertension ([place]), Renal insufficiency, and Wears dentures.  PAST SURGICAL HISTORY: She  has a past surgical history that includes Cataract extraction (Bilateral); Coronary artery bypass graft (N/A, 07/02/2013); Intraoprative transesophageal echocardiogram (N/A, 07/02/2013); Thoracentesis (Left, 2015); Cholecystectomy (N/A, 12/09/2014); Esophagogastroduodenoscopy (egd) with propofol (N/A, 11/24/2015); Cardiac catheterization (Right, 12/06/2015); Eye surgery (Bilateral); Cardiac catheterization; AV fistula placement (Right, 02/03/2016); PORTA CATH REMOVAL (N/A, 06/01/2016); PERIPHERAL VASCULAR THROMBECTOMY (Right, 09/28/2016); A/V Fistulagram (Right, 12/17/2016); A/V Fistulagram (Right, 01/07/2017); A/V SHUNT INTERVENTION (N/A, 02/22/2017); A/V Fistulagram (Right, 12/03/2017); PERIPHERAL VASCULAR THROMBECTOMY (Right, 05/20/2018); and A/V Fistulagram (Right, 02/23/2019).  Allergies  Allergen Reactions  . Nsaids  Other (See Comments)    Contraindicated due to kidney disease.  Marland Kitchen Doxycycline Other (See Comments)  tremor    No current facility-administered medications on file prior to encounter.   Current Outpatient Medications on File Prior to Encounter  Medication Sig  . ACCU-CHEK FASTCLIX LANCETS MISC Check blood glucose twice daily, diagnosis code E11.9  . acetaminophen (TYLENOL) 325 MG tablet Take 650 mg by mouth daily as needed for moderate pain or headache.   . Alirocumab (PRALUENT) 75 MG/ML SOAJ Inject 75 mg into the skin every 14 (fourteen) days.  . Amino Acids-Protein Hydrolys (FEEDING SUPPLEMENT, PRO-STAT SUGAR FREE 64,) LIQD Take 30 mLs by mouth 3 (three) times daily between meals.  Marland Kitchen amiodarone (PACERONE) 200 MG tablet Take 200 mg by mouth daily.  Marland Kitchen aspirin EC 81 MG tablet Take 81 mg by mouth daily.  Marland Kitchen atorvastatin (LIPITOR) 40 MG tablet Take 40 mg by mouth daily.  . Blood Glucose Monitoring Suppl (ACCU-CHEK AVIVA PLUS) w/Device KIT Use as directed to test blood sugar up to three times daily  . Calcium Acetate 667 MG TABS Take 2,001 mg by mouth 3 (three) times daily with meals.  Marland Kitchen ELIQUIS 5 MG TABS tablet Take 5 mg by mouth 2 (two) times daily.   . ferrous sulfate 325 (65 FE) MG tablet Take 325 mg by mouth daily with breakfast.  . FLUoxetine (PROZAC) 20 MG capsule Take 20 mg by mouth daily.  . fluticasone (FLONASE) 50 MCG/ACT nasal spray Place 2 sprays into both nostrils daily. (Patient taking differently: Place 2 sprays into both nostrils daily as needed for allergies or rhinitis. )  . furosemide (LASIX) 20 MG tablet Take 40 mg by mouth every other day. Non-dialysis days   . gabapentin (NEURONTIN) 300 MG capsule Take 300 mg by mouth 2 (two) times daily.   Marland Kitchen glucose blood (ACCU-CHEK AVIVA) test strip Use as instructed to test blood sugar up to 3 times daily  . Insulin Glargine (BASAGLAR KWIKPEN) 100 UNIT/ML SOPN Inject 18 Units into the skin daily.  . Lancets (ACCU-CHEK SOFT TOUCH)  lancets Use as instructed  . levothyroxine (SYNTHROID) 88 MCG tablet Take 88 mcg by mouth daily before breakfast.  . lidocaine-prilocaine (EMLA) cream Apply 1 application topically as needed (port access).  . midodrine (PROAMATINE) 10 MG tablet Take 1 tablet (10 mg total) by mouth 3 (three) times daily with meals. (Patient taking differently: Take 10 mg by mouth 3 (three) times daily as needed (low BP). )  . multivitamin (RENA-VIT) TABS tablet Take 1 tablet by mouth at bedtime.  . nitroGLYCERIN (NITROSTAT) 0.4 MG SL tablet DISSOLVE ONE TABLET UNDER THE TONGUE EVERY 5 MINUTES AS NEEDED FOR CHEST PAIN.  DO NOT EXCEED A TOTAL OF 3 DOSES IN 15 MINUTES (Patient taking differently: Place 0.4 mg under the tongue every 5 (five) minutes as needed for chest pain. DISSOLVE ONE TABLET UNDER THE TONGUE EVERY 5 MINUTES AS NEEDED FOR CHEST PAIN.  DO NOT EXCEED A TOTAL OF 3 DOSES IN 15 MINUTES)  . ondansetron (ZOFRAN-ODT) 4 MG disintegrating tablet Take 1 tablet (4 mg total) by mouth every 8 (eight) hours as needed for nausea or vomiting. appt with PCP further refills Dr. Caryl Bis  . pantoprazole (PROTONIX) 40 MG tablet Take 40 mg by mouth daily.  . vitamin C (VITAMIN C) 500 MG tablet Take 1 tablet (500 mg total) by mouth daily.  Marland Kitchen zinc sulfate 220 (50 Zn) MG capsule Take 1 capsule (220 mg total) by mouth daily.  . mirtazapine (REMERON) 15 MG tablet Take 1 tablet (15 mg total) by mouth at bedtime. (Patient not  taking: Reported on 02/09/2019)    FAMILY HISTORY:  Her She indicated that her mother is deceased. She indicated that her father is deceased. She indicated that her sister is alive. She indicated that the status of her maternal grandmother is unknown. She indicated that the status of her paternal grandfather is unknown. She indicated that both of her daughters are alive. She indicated that both of her sons are alive. She indicated that the status of her neg hx is unknown.   SOCIAL HISTORY: She  reports that  she has never smoked. She has never used smokeless tobacco. She reports that she does not drink alcohol or use drugs.    COVID-19 DISASTER DECLARATION:  FULL CONTACT PHYSICAL EXAMINATION WAS NOT POSSIBLE DUE TO TREATMENT OF COVID-19 AND  CONSERVATION OF PERSONAL PROTECTIVE EQUIPMENT, LIMITED EXAM FINDINGS INCLUDE-  Patient assessed or the symptoms described in the history of present illness.  In the context of the Global COVID-19 pandemic, which necessitated consideration that the patient might be at risk for infection with the SARS-CoV-2 virus that causes COVID-19, Institutional protocols and algorithms that pertain to the evaluation of patients at risk for COVID-19 are in a state of rapid change based on information released by regulatory bodies including the CDC and federal and state organizations. These policies and algorithms were followed during the patient's care while in hospital.   REVIEW OF SYSTEMS:   Unable to assess due to critical illness and intubation  SUBJECTIVE:  Unable to assess due to critical illness and intubation  VITAL SIGNS: BP (!) 102/30 (BP Location: Left Arm)   Pulse 72   Temp 98.3 F (36.8 C) (Axillary)   Resp 17   Ht _0  (1.626 m)   Wt 75.5 kg   SpO2 100%   BMI 28.57 kg/m   HEMODYNAMICS:    VENTILATOR SETTINGS: Vent Mode: PRVC FiO2 (%):  [40 %] 40 % Set Rate:  [14 bmp] 14 bmp Vt Set:  [450 mL] 450 mL PEEP:  [5 cmH20] 5 cmH20  INTAKE / OUTPUT: I/O last 3 completed shifts: In: 217.6 [I.V.:67.6; IV Piggyback:150] Out: 1400 [Other:1000; Stool:400]  PHYSICAL EXAMINATION: General:  Acute on chronically ill appearing female, laying in bed, intubated, in no acute distress Neuro:  Opens eyes and withdraws from pain, pupils PERRLA 4 mm bilaterally HEENT:  Atraumatic, normocephalic, neck supple, no JVD, ETT in place Cardiovascular:  Regular rate & rhythm, s1s2 noted, no M/R/G Lungs:  Clear diminished breath sounds to auscultation bilaterally,  no wheezing or rhonchi noted, even, vent assisted Abdomen:  Obese, soft, nontender, nondistended, no guarding or rebound tenderness, BS hypoactive Musculoskeletal:  No deformities Skin:  Left heel stage II decubitus ulcer with black eschar, right heel stage II decubitus ulcer  LABS:  BMET Recent Labs  Lab 03/01/19 0429 03/01/19 2014 03/02/19 0329 03/03/19 0636  NA 135  --  134* 139  K 4.1 4.2 4.4 4.0  CL 96*  --  96* 101  CO2 26  --  22 26  BUN 21  --  28* 19  CREATININE 3.03*  --  3.61* 2.44*  GLUCOSE 256*  --  130* 125*    Electrolytes Recent Labs  Lab 03/01/19 0429 03/01/19 2014 03/02/19 0329 03/03/19 0636  CALCIUM 8.2*  --  8.3* 8.3*  MG 1.9  --  1.9 2.0  PHOS 1.9* 3.2 2.9 1.8*    CBC Recent Labs  Lab 03/01/19 0429 03/02/19 0329 03/03/19 0636  WBC 8.3 10.4 10.0  HGB 8.6*  8.2* 8.9*  HCT 25.0* 24.1* 26.3*  PLT 184 197 172    Coag's No results for input(s): APTT, INR in the last 168 hours.  Sepsis Markers No results for input(s): LATICACIDVEN, PROCALCITON, O2SATVEN in the last 168 hours.  ABG Recent Labs  Lab 03/03/19 1843  PHART 7.56*  PCO2ART 34  PO2ART 190*    Liver Enzymes Recent Labs  Lab 02/27/19 0413 02/28/19 0626 03/01/19 0429  AST _0 ALT _1 ALKPHOS 59 63 58  BILITOT 1.7* 1.0 0.9  ALBUMIN 2.1* 2.0* 1.8*    Cardiac Enzymes No results for input(s): TROPONINI, PROBNP in the last 168 hours.  Glucose Recent Labs  Lab 03/03/19 0407 03/03/19 0742 03/03/19 1158 03/03/19 1642 03/03/19 1814 03/03/19 1954  GLUCAP 114* 101* 112* 79 80 84    Imaging X-ray chest PA or AP  Result Date: 03/03/2019 CLINICAL DATA:  ETT placement EXAM: CHEST  1 VIEW COMPARISON:  03/01/2019 FINDINGS: Endotracheal tube with the tip 4 cm above the carina. Dual lumen left-sided central venous catheter with the tip projecting over the SVC. Bilateral mild interstitial thickening. No pleural effusion or pneumothorax. Linear band of airspace  disease in the right lung likely reflecting atelectasis. Stable cardiomegaly.  Prior CABG.  No acute osseous abnormality. IMPRESSION: 1. Cardiomegaly with pulmonary vascular congestion. 2. Linear band of airspace disease in the right lung likely reflecting atelectasis. Electronically Signed   By: Kathreen Devoid   On: 03/03/2019 17:53     STUDIES:  12/7 - CT Head wo Contrast>> 1. No acute intracranial abnormality. 2. Mild parenchymal atrophy and chronic microvascular angiopathy. 12/7 - 2D Echocardiogram>> EF 60-65%, Grade II Diastolic Dysfunction 84/5 - Venous US LUE>>Examination is positive for age-indeterminate short segment occlusive DVT involving the mid humeral aspect of one of the paired brachial veins. There is no extension of this short-segment occlusive DVT to the more proximal venous system of the left upper extremity. 12/10 - MR Brain w/o Contrast >>1. No acute intracranial abnormality. 2. Mild chronic small vessel disease. 12/15 - CT Cervical Spine>> No evidence of discitis/osteomyelitis or collection. 12/15 - CT Thoracic Spine>>No evidence of discitis/osteomyelitis in the thoracic spine. No collection. Bilateral pleural effusions and bibasilar atelectasis/consolidation. 12/15 - CT Lumbar Spine>> No evidence of discitis/osteomyelitis in the lumbar spine. Nonspecific mild presacral edema. No paraspinal collection 12/16 - DG 2V Left Foot>> Calcaneal spur.  No evidence of osteomyelitis. 12/24 - CT Head wo Contrast>>1. Generalized atrophy and chronic ischemic microangiopathy without acute intracranial abnormality. 12/24 - CT Cervical Spine>> No acute fracture or static subluxation of the cervical spine.  CULTURES: SARS-CoV-2 PCR 12/7>> negative Blood culture 12/7>> MRSA MRSA PCR 12/8>> positive Blood culture 12/9>> MRSA Blood cultures 12/11>> MRSA Blood cultures 12/13>> MRSA Wound culture from left heel 12/15>> MRSA Blood cultures 12/16>> no growth to  date  ANTIBIOTICS: Ceftaroline 12/14>> 12/22 Daptomycin 12/14>>12/28 Vancomycin 12/24>>  SIGNIFICANT EVENTS: 12/7>> admission to MedSurg 12/8>> transfer to stepdown due to hypotension requiring vasopressors; PCCM and infectious disease consulted 36/46>> fall, no complications noted 80/32>> PEG tube placement, returns to ICU, remains intubated  LINES/TUBES: Right Femoral CVC 12/8>>12/14 Left IJ Tunneled HD Catheter Left Femoral CVC 12/19>> PEG tube placement 12/29>> ETT 12/29>>  ASSESSMENT / PLAN:  PULMONARY A: Remains intubated post PEG placement (plan for OR on 12/30 for AV Graft removal) Hx: Pulmonary HTN P:   Supplemental O2 as needed to maintain O2 sats >92% Full vent support Wean FiO2 & PEEP as tolerated Follow  intermittent CXR & ABG Prn Bronchodilators SBT when respiratory parameters met and mental status permits Pt to undergo AV graft removal on 12/30 by Vascular surgery, SBT to ensue once procedure completed  CARDIOVASCULAR A:  Hypotension, ? In setting of sedation -Hemoglobin stable @ 8.6 - + 5.5L since admission -WBC remains stable @ 10.0, afebrile -EF 60-65% on Echo 12/8 Chronic HFpEF Paroxsymal Atrial Fibrillation ~ currently in NSR Hx: HTN, CAD s/p stent, Chronic HFpEF P:  Continuous cardiac monitoring Maintain MAP >60 (given she is on Hemodialysis) Levophed if needed to maintain MAP goal Continue Midodrine Cautious IV Fluids given ESRD on HD Volume removal with HD Heparin drip for Anticoagulation 2D Echocardiogram 02/10/19>> EF 60-65%, grade II diastolic dysfuntion  RENAL A:   ESRD on HD P:   Monitor I&O's / urinary output Follow BMP Ensure adequate renal perfusion Avoid nephrotoxic agents as able Replace electrolytes as indicated Nephrology following, appreciate input ~ HD as per Nephrology  GASTROINTESTINAL A:   No acute issues  Hx: GERD P:   NPO for now; Able to start tube feeds on 12/30 following AV graft removal Protonix for  SUP PEG placed on 03/03/19 by GI, will follow recommendations  HEMATOLOGIC A:   Left Arm DVT P:  Monitor for S/Sx of bleeding Trend CBC Heparin drip for VTE Prophylaxis/Anticoagulation  Transfuse for Hgb <7   INFECTIOUS A:   Sepsis secondary to MRSA Bacteremia (ID suspects infected AV graft with stent) P:   Monitor fever curve Trend WBC's and Procalcitonin Follow cultures as above On Vancomycin ID following, appreciate input ~ will follow recommendations CT Spine negative, TEE not performed due to encephalopathy, Left heel decubitus ulcer with MRSA (but no osteomyelitis) Plan for AV Graft removal on 12/30  ENDOCRINE A:   DM II  Hypothyoidism P:   CBG's SSI Follow ICU Hypo/hyperglycemia protocol Continue Synthroid  NEUROLOGIC A:   Acute metabolic Encephalopathy P:   RASS goal: 0 to -1 Precedex gtt and Fentanyl pushes to maintain RASS goal Avoid sedating meds as able Daily WUA MRI Brain negative EEG negative Ammonia normal TSH normal   FAMILY  - Updates: No family at bedside during NP rounds 03/03/19.  Pt prognosis is poor.  Pt is DNR.  - Inter-disciplinary family meet or Palliative Care currently following.    Darel Hong, AGACNP-BC [facility] Pager: [phone]  03/03/2019, 8:54 PM

## 2019-03-03 NOTE — Progress Notes (Signed)
Vonda Antigua, MD [address], [place], [place], [place], [address] [address], [place], [place], [place], [address] Phone: [phone]  Fax: [phone]   Subjective: Pt received feeds until 5:48 AM today even with NPO orders in. Heparin drip held   Objective: Exam: Vital signs in last 24 hours: Vitals:   03/02/19 1917 03/03/19 0339 03/03/19 1232 03/03/19 1546  BP: (!) 149/61 134/62 (!) 127/44 (!) 123/43  Pulse: 82 91 77 75  Resp:  (!) 23 16 20   Temp: 98.9 F (37.2 C) 99.7 F (37.6 C) 98.4 F (36.9 C) (!) 97.3 F (36.3 C)  TempSrc:  Oral Oral Temporal  SpO2: 98% 100% 100% 100%  Weight:      Height:       Weight change:   Intake/Output Summary (Last 24 hours) at 03/03/2019 1600 Last data filed at 03/03/2019 0700 Gross per 24 hour  Intake 217.6 ml  Output 400 ml  Net -182.4 ml    General: No acute distress, AAO x3 Abd: Soft, NT/ND, No HSM Skin: Warm, no rashes Neck: Supple, Trachea midline   Lab Results: Lab Results  Component Value Date   WBC 10.0 03/03/2019   HGB 8.9 (L) 03/03/2019   HCT 26.3 (L) 03/03/2019   MCV 88.3 03/03/2019   PLT 172 03/03/2019   Micro Results: No results found for this or any previous visit (from the past 240 hour(s)). Studies/Results: No results found. Medications:  Scheduled Meds: . [MAR Hold] aspirin  81 mg Oral Daily  . [MAR Hold] B-complex with vitamin C  1 tablet Per Tube Daily  . [MAR Hold] chlorhexidine  15 mL Mouth Rinse BID  . [MAR Hold] Chlorhexidine Gluconate Cloth  6 each Topical Q0600  . [MAR Hold] epoetin (EPOGEN/PROCRIT) injection  4,000 Units Intravenous Q M,W,F-HD  . [MAR Hold] fluticasone  2 spray Each Nare Daily  . [MAR Hold] free water  30 mL Per Tube Q4H  . [MAR Hold] insulin aspart  0-15 Units Subcutaneous Q4H  . [MAR Hold] levothyroxine  88 mcg Oral QAC breakfast  . [MAR Hold] mouth rinse  15 mL Mouth Rinse q12n4p  . [MAR Hold] midodrine  10 mg Oral TID WC  . [MAR Hold] pantoprazole  (PROTONIX) IV  40 mg Intravenous Q24H  . [MAR Hold] potassium phosphate (monobasic)  1,000 mg Oral Q4H  . [MAR Hold] venlafaxine  75 mg Oral BID WC  . [MAR Hold] zinc sulfate  220 mg Oral Daily   Continuous Infusions: . [MAR Hold] sodium chloride Stopped (03/01/19 0429)  . [MAR Hold] sodium chloride    . sodium chloride    . [MAR Hold] vancomycin Stopped (03/02/19 1752)   PRN Meds:.[MAR Hold] sodium chloride, [MAR Hold] sodium chloride, [MAR Hold] haloperidol lactate, [MAR Hold] ondansetron (ZOFRAN) IV   Assessment: Principal Problem:   Sepsis due to methicillin resistant Staphylococcus aureus (MRSA) ([place]) Active Problems:   Type 2 diabetes mellitus with ESRD (end-stage renal disease) ([facility])   Atrial fibrillation, chronic (HCC)   Hypotension   Chronic diastolic CHF (congestive heart failure) ([facility])   ESRD (end stage renal disease) ([facility])   Altered level of consciousness   Decubitus ulcer of heel, bilateral, stage 2 ([facility])   MRSA bacteremia   Arm DVT (deep venous thromboembolism), acute, left ([facility])   Acute encephalopathy   Acute metabolic encephalopathy   Acute blood loss anemia   AF (paroxysmal atrial fibrillation) ([place])   Diarrhea    Plan: Proceed with PEG tube placement Family  agreeable I have discussed alternative options, risks & benefits,  which include, but are not limited to, bleeding, infection, perforation,respiratory complication & drug reaction.  The patient agrees with this plan & written consent will be obtained.      LOS: 22 days   Vonda Antigua, MD 03/03/2019, 4:00 PM

## 2019-03-03 NOTE — Progress Notes (Signed)
[facility]  Daily Progress Note   Subjective  -   Remains encephalopathic, unable to provide history No major events overnight   Objective Vitals:   03/02/19 1331 03/02/19 1917 03/03/19 0339 03/03/19 1232  BP: (!) 122/44 (!) 149/61 134/62 (!) 127/44  Pulse: 78 82 91 77  Resp: 16  (!) 23 16  Temp: 99.5 F (37.5 C) 98.9 F (37.2 C) 99.7 F (37.6 C) 98.4 F (36.9 C)  TempSrc: Axillary  Oral Oral  SpO2: 100% 98% 100% 100%  Weight:      Height:        Intake/Output Summary (Last 24 hours) at 03/03/2019 1249 Last data filed at 03/03/2019 0700 Gross per 24 hour  Intake 217.6 ml  Output 400 ml  Net -182.4 ml    PULM  CTAB CV  RRR VASC  Right arm AVG with some mild bleeding, good thrill.  Left jugular Permcath in place without erythema or drainage.  Laboratory CBC    Component Value Date/Time   WBC 10.0 03/03/2019 0636   HGB 8.9 (L) 03/03/2019 0636   HGB 10.5 (L) 07/23/2013 0741   HCT 26.3 (L) 03/03/2019 0636   HCT 27.6 (L) 02/25/2019 0845   PLT 172 03/03/2019 0636   PLT 275 07/23/2013 0741    BMET    Component Value Date/Time   NA 139 03/03/2019 0636   NA 140 03/30/2014 1611   K 4.0 03/03/2019 0636   K 4.3 03/30/2014 1611   CL 101 03/03/2019 0636   CL 105 03/30/2014 1611   CO2 26 03/03/2019 0636   CO2 29 03/30/2014 1611   GLUCOSE 125 (H) 03/03/2019 0636   GLUCOSE 187 (H) 03/30/2014 1611   BUN 19 03/03/2019 0636   BUN 21 (H) 03/30/2014 1611   CREATININE 2.44 (H) 03/03/2019 0636   CREATININE 3.14 (H) 09/09/2015 1534   CALCIUM 8.3 (L) 03/03/2019 0636   CALCIUM 9.0 03/30/2014 1611   GFRNONAA 20 (L) 03/03/2019 0636   GFRNONAA 36 (L) 03/30/2014 1611   GFRNONAA >60 07/23/2013 0741   GFRAA 23 (L) 03/03/2019 0636   GFRAA 44 (L) 03/30/2014 1611   GFRAA >60 07/23/2013 0741    Assessment/Planning:    ESRD.  No with permcath due to bleeding from right arm AVG.  This seems to be working well  Bacteremia. No obvious source so the  AVG could certainly be the source of the infection.  In that case, the graft should be removed.  It is patent and bleeding risk is present.  If it is not removed and is infected, infection will not clear and it is at high risk of blowing out and causing life threatening hemorrhage.  Discussed with Dr. Candiss Norse and Dr. Delaine [name] and will plan excision of right arm AVG tomorrow in the operating room.  Encephalopathy. Likely from persistent bacteremia.   DM. BS control has been decent.    Jamie Arias  03/03/2019, 12:49 PM

## 2019-03-03 NOTE — Progress Notes (Signed)
OT Cancellation Note  Patient Details Name: Jamie Arias MRN: [id number] DOB: 1955-08-17   Cancelled Treatment:    Reason Eval/Treat Not Completed: Patient at procedure or test/ unavailable(Pt. is at Dialysis. Will continue to monitor, and will reattempt OT treatment at a later time or date.)  Harrel Carina, MS, OTR/L 03/03/2019, 3:52 PM

## 2019-03-03 NOTE — Progress Notes (Signed)
VAST consulted to place Biopatch on L femoral CVC. Dressing was changed, but Biopatch was not available at that time. Called PACU and asked that nurse be notified dressing change will be completed once pt in private room.

## 2019-03-03 NOTE — Anesthesia Procedure Notes (Signed)
Procedure Name: Intubation Date/Time: 03/03/2019 4:06 PM Performed by: Hedda Slade, CRNA Pre-anesthesia Checklist: Patient identified, Patient being monitored, Timeout performed, Emergency Drugs available and Suction available Patient Re-evaluated:Patient Re-evaluated prior to induction Oxygen Delivery Method: Circle system utilized Preoxygenation: Pre-oxygenation with 100% oxygen Induction Type: IV induction and Rapid sequence Laryngoscope Size: 3 and McGraph Grade View: Grade I Tube type: Oral Tube size: 7.0 mm Number of attempts: 1 Airway Equipment and Method: Stylet Placement Confirmation: ETT inserted through vocal cords under direct vision,  positive ETCO2 and breath sounds checked- equal and bilateral Secured at: 21 cm Tube secured with: Tape Dental Injury: Teeth and Oropharynx as per pre-operative assessment

## 2019-03-03 NOTE — Progress Notes (Addendum)
Assisted with PEG tube placement for dysphagia due to altered mental status primary proceduralist was Dr. Bonna Gains.  PEG placed without any difficulty and in good position.  No blood loss.

## 2019-03-03 NOTE — Progress Notes (Signed)
VAST RN spoke with pt's nurse in ICU. Advised about need for groin CL dressing to be changed and Biopatch applied. She verbalized understanding.

## 2019-03-03 NOTE — TOC Progression Note (Signed)
Transition of Care [facility]) - Progression Note    Patient Details  Name: Jamie Arias MRN: [id number] Date of Birth: 04/25/1955  Transition of Care [facility]) CM/SW Contact  Ross Ludwig, [place] Phone Number: 03/03/2019, 5:14 PM  Clinical Narrative:     CSW received phone call from Ander Purpura at [facility] [phone], that they have a couple of discharges this week, and may be able to accept patient once she is medically ready for discharge to [facility].  CSW spoke to patient's husband Shannara Night [phone] and he is in agreement to [facility].  CSW updated Lauren from [facility], and she will call husband to talk to him about LTACH.  CSW updated physician and bedside nurse that patient is now on the wait list for [facility].  CSW to continue to follow patient's progress throughout discharge planning.    Expected Discharge Plan: [place] Barriers to Discharge: Continued Medical Work up  Expected Discharge Plan and Services Expected Discharge Plan: [place] In-house Referral: Clinical Social Work     Living arrangements for the past 2 months: Mobile Home                                       Social Determinants of Health (SDOH) Interventions    Readmission Risk Interventions Readmission Risk Prevention Plan 02/13/2019 09/15/2018  Transportation Screening - Complete  Medication Review Press photographer) Complete Complete  PCP or Specialist appointment within 3-5 days of discharge - Complete  HRI or [place] Complete Complete  SW Recovery Care/Counseling Consult Complete Complete  [place] Not Applicable Complete  Some recent data might be hidden

## 2019-03-03 NOTE — Progress Notes (Signed)
Pharmacy Electrolyte Monitoring Consult:  Pharmacy consulted to assist in monitoring and replacing electrolytes in this 63 y.o. female admitted on 02/09/2019. Patient with ESRD; last dialysis 12/14. Patient with MRSA bacteremia. She has a h/o CAD and is s/p CABG. Hemodialysis MWF.   Labs:  Sodium (mmol/L)  Date Value  03/03/2019 139  03/30/2014 140   Potassium (mmol/L)  Date Value  03/03/2019 4.0  03/30/2014 4.3   Magnesium (mg/dL)  Date Value  03/03/2019 2.0  07/19/2013 1.7 (L)   Phosphorus (mg/dL)  Date Value  03/03/2019 1.8 (L)   Calcium (mg/dL)  Date Value  03/03/2019 8.3 (L)   Calcium, Total (mg/dL)  Date Value  03/30/2014 9.0   Albumin (g/dL)  Date Value  03/01/2019 1.8 (L)  02/11/2018 4.2  07/23/2013 2.7 (L)   Corrected Ca: 10.5 mg/dL  Goals of Therapy Given Cardiac History: Potassium 4.0 - 5.1 mmol/L Magnesium 2.0 - 2.4 mg/dL All Other Electrolytes WNL   Plan:  KPhos 1g PO Q4 hours x 4 doses.  electrolytes in am  Pharmacy will continue to monitor and adjust per consult.   Pearla Dubonnet, PharmD Clinical Pharmacist 03/03/2019 2:07 PM

## 2019-03-03 NOTE — Anesthesia Preprocedure Evaluation (Addendum)
Anesthesia Evaluation  Patient identified by MRN, date of birth, ID band Patient unresponsive  General Assessment Comment:Patient does not respond to commands and not vocal.  Reviewed: Allergy & Precautions, NPO status , Patient's Chart, lab work & pertinent test results  History of Anesthesia Complications Negative for: history of anesthetic complications  Airway Mallampati: III      Comment: Pt not cooperative with exam  Dental  (+) Poor Dentition   Pulmonary neg pulmonary ROS, neg sleep apnea, neg COPD,    breath sounds clear to auscultation- rhonchi (-) wheezing      Cardiovascular hypertension, + CAD, + Past MI, + CABG (2014) and +CHF (preserved EF)  (-) Cardiac Stents  Rhythm:Regular Rate:Normal - Systolic murmurs and - Diastolic murmurs Echo [id number]: 1. Left ventricular ejection fraction, by visual estimation, is 60 to 65%. The left ventricle has normal function. There is mildly increased left ventricular hypertrophy.  2. Elevated left atrial pressure.  3. Left ventricular diastolic parameters are consistent with Grade II diastolic dysfunction (pseudonormalization).  4. The left ventricle has no regional wall motion abnormalities.  5. Global right ventricle has mildly reduced systolic function.The right ventricular size is mildly enlarged. Mildly increased right ventricular wall thickness.  6. Left atrial size was normal.  7. Right atrial size was normal.  8. Mild mitral annular calcification.  9. The mitral valve is normal in structure. Trace mitral valve regurgitation. No evidence of mitral stenosis. 10. The tricuspid valve is normal in structure. Tricuspid valve regurgitation moderate-severe. 11. The aortic valve is tricuspid. Aortic valve regurgitation is trivial. Mild to moderate aortic valve sclerosis/calcification without any evidence of aortic stenosis. 12. The pulmonic valve was grossly normal. Pulmonic valve  regurgitation is trivial. 13. Mildly elevated pulmonary artery systolic pressure. 14. The inferior vena cava is normal in size with <50% respiratory variability, suggesting right atrial pressure of 8 mmHg.   Neuro/Psych neg Seizures PSYCHIATRIC DISORDERS Depression encephalopathy  Neuromuscular disease    GI/Hepatic Neg liver ROS, GERD  ,  Endo/Other  diabetes, Insulin DependentHypothyroidism   Renal/GU ESRF and DialysisRenal disease     Musculoskeletal   Abdominal (+) - obese,   Peds  Hematology  (+) anemia ,   Anesthesia Other Findings Past Medical History: No date: Anemia No date: Chronic diastolic CHF (congestive heart failure) (HCC)     Comment:  a. Due to ischemic cardiomyopathy. EF as low as 35%,               improved to normal s/p CABG; b. echo 07/06/13: EF 55-60%,               no RWMAs, mod TR, trivial pericardial effusion not c/w               tamponade physiology;  c. 10/2015 Echo: EF 65%, Gr1 DD,               triv AI, mild MR, mildly dil LA, mod TR, PASP 74mmHg. No date: Coronary artery disease     Comment:  a. NSTEMI 06/2013; b.cath: severe three-vessel CAD w/ EF               30% & mild-mod MR; c. s/p 3 vessel CABG 07/02/13               (LIMA-LAD, SVG-OM, and SVG-RPDA);  d. 10/2015 MV: no               ischemia/infarct. No date: Diabetes mellitus without complication (HCC) No date:  Diabetic neuropathy ([place]) No date: Dialysis patient [facility])     Comment:  MWF No date: ESRD (end stage renal disease) ([place])     Comment:  a. 12/2015 initiated - mwf dialysis. No date: GERD (gastroesophageal reflux disease) No date: Hyperlipidemia No date: Hypertension No date: Hypothyroidism 2015: Myocardial infarction ([place]) No date: Neuropathy 2015: Pleural effusion No date: Pulmonary hypertension (HCC) No date: Renal insufficiency No date: Wears dentures     Comment:  full lower   Reproductive/Obstetrics                           Anesthesia  Physical Anesthesia Plan  ASA: IV  Anesthesia Plan: General   Post-op Pain Management:    Induction: Intravenous  PONV Risk Score and Plan: 2 and Propofol infusion  Airway Management Planned: Oral ETT  Additional Equipment:   Intra-op Plan:   Post-operative Plan: Extubation in OR and Possible Post-op intubation/ventilation  Informed Consent: I have reviewed the patients History and Physical, chart, labs and discussed the procedure including the risks, benefits and alternatives for the proposed anesthesia with the patient or authorized representative who has indicated his/her understanding and acceptance.   Patient has DNR.  Discussed DNR with power of attorney and Suspend DNR.   Dental advisory given and Consent reviewed with POA  Plan Discussed with: CRNA and Anesthesiologist  Anesthesia Plan Comments:       Anesthesia Quick Evaluation

## 2019-03-03 NOTE — Progress Notes (Signed)
Pt arrived on unit at this time on vent support. RT at bedside. Report received from PACU RN. Pt is not on any sedation and has eyes open but is not following any commands. Breathing is normal.

## 2019-03-03 NOTE — Progress Notes (Signed)
[name], female   DOB: [birth year], 63 y.o.   MRN: [id number]  Notified that patient was intubated for PEG procedure.  Anesthesia did not extubate the patient post procedure.  Patient will need an ICU bed.  I spoke with the ICU charge nurse and the critical care specialist to evaluate.  Patient supposed to have a vascular procedure tomorrow also.  Dr Loletha Grayer

## 2019-03-03 NOTE — Progress Notes (Signed)
ID  Pt getting PEG  Patient Vitals for the past 24 hrs:  BP Temp Temp src Pulse Resp SpO2  03/03/19 1232 (!) 127/44 98.4 F (36.9 C) Oral 77 16 100 %  03/03/19 0339 134/62 99.7 F (37.6 C) Oral 91 (!) 23 100 %  03/02/19 1917 (!) 149/61 98.9 F (37.2 C) -- 82 -- 98 %       CBC Latest Ref Rng & Units 03/03/2019 03/02/2019 03/01/2019  WBC 4.0 - 10.5 K/uL 10.0 10.4 8.3  Hemoglobin 12.0 - 15.0 g/dL 8.9(L) 8.2(L) 8.6(L)  Hematocrit 36.0 - 46.0 % 26.3(L) 24.1(L) 25.0(L)  Platelets 150 - 400 K/uL 172 197 184    CMP Latest Ref Rng & Units 03/03/2019 03/02/2019 03/01/2019  Glucose 70 - 99 mg/dL 125(H) 130(H) -  BUN 8 - 23 mg/dL 19 28(H) -  Creatinine 0.44 - 1.00 mg/dL 2.44(H) 3.61(H) -  Sodium 135 - 145 mmol/L 139 134(L) -  Potassium 3.5 - 5.1 mmol/L 4.0 4.4 4.2  Chloride 98 - 111 mmol/L 101 96(L) -  CO2 22 - 32 mmol/L 26 22 -  Calcium 8.9 - 10.3 mg/dL 8.3(L) 8.3(L) -  Total Protein 6.5 - 8.1 g/dL - - -  Total Bilirubin 0.3 - 1.2 mg/dL - - -  Alkaline Phos 38 - 126 U/L - - -  AST 15 - 41 U/L - - -  ALT 0 - 44 U/L - - -     Impression/recommendation  MRSA bacteremia- currently on vanco after 10 days on ceftaroline _ dapto. Not vanco failure but rather source control issue She will be having AV graft removal tomorrow as very likely that is infected because of stents in place and also the source.Concern for breakthru bacteremia  Left heel superficial pressure ulcer with MRSA- but bone not involved Impaging of the spine- no lesions TEE could not be done because of encephalopathy  she will need long course of IV antibiotics ( 6 weeks)  Encephalopathy-   Post COVID morbidity  ESRD   PEG being placed  Discussed the management with the care team.

## 2019-03-03 NOTE — Progress Notes (Signed)
Daily Progress Note   Patient Name: Jamie Arias       Date: 03/03/2019 DOB: 1955-04-20  Age: 63 y.o. MRN#: [id number] Attending Physician: Loletha Grayer, MD Primary Care Physician: [name], MD Admit Date: 02/09/2019  Reason for Consultation/Follow-up: Establishing goals of care  Subjective: Patient is sleeping during my visit with husband at bedside. He states she has been more alert. He states she has not declined since last week. Discussed her QOL and what would be acceptable. He feels her current status and QOL would be acceptable. He would like PEG placement for her. He discusses LTACH placement. Attempted to discuss "what if's", but he is unable to advise on them. Encouraged him to continue thinking and planning for the future in various scenarios. Discussed continuing to speak with her children.   Length of Stay: 22  Current Medications: Scheduled Meds:  . aspirin  81 mg Oral Daily  . B-complex with vitamin C  1 tablet Per Tube Daily  . chlorhexidine  15 mL Mouth Rinse BID  . Chlorhexidine Gluconate Cloth  6 each Topical Q0600  . epoetin (EPOGEN/PROCRIT) injection  4,000 Units Intravenous Q M,W,F-HD  . fluticasone  2 spray Each Nare Daily  . free water  30 mL Per Tube Q4H  . insulin aspart  0-15 Units Subcutaneous Q4H  . insulin aspart  3 Units Subcutaneous Q4H  . insulin glargine  10 Units Subcutaneous Daily  . levothyroxine  88 mcg Oral QAC breakfast  . mouth rinse  15 mL Mouth Rinse q12n4p  . midodrine  10 mg Oral TID WC  . pantoprazole (PROTONIX) IV  40 mg Intravenous Q24H  . potassium phosphate (monobasic)  1,000 mg Oral Q4H  . venlafaxine  75 mg Oral BID WC  . zinc sulfate  220 mg Oral Daily    Continuous Infusions: . sodium chloride Stopped (03/01/19  0429)  . sodium chloride    . vancomycin Stopped (03/02/19 1752)    PRN Meds: sodium chloride, sodium chloride, haloperidol lactate, ondansetron (ZOFRAN) IV  Physical Exam Constitutional:      Comments: Eyes closed.   Pulmonary:     Effort: Pulmonary effort is normal.             Vital Signs: BP 134/62 (BP Location: Right Leg)   Pulse 91  Temp 99.7 F (37.6 C) (Oral)   Resp (!) 23   Ht 5\' 4"  (1.626 m)   Wt 75.5 kg   SpO2 100%   BMI 28.57 kg/m  SpO2: SpO2: 100 % O2 Device: O2 Device: Room Air O2 Flow Rate: O2 Flow Rate (L/min): 2 L/min  Intake/output summary:   Intake/Output Summary (Last 24 hours) at 03/03/2019 1212 Last data filed at 03/03/2019 0700 Gross per 24 hour  Intake 217.6 ml  Output 1400 ml  Net -1182.4 ml   LBM: Last BM Date: 03/03/19(rectal tube) Baseline Weight: Weight: 59 kg Most recent weight: Weight: 75.5 kg       Palliative Assessment/Data:       Patient Active Problem List   Diagnosis Date Noted  . Acute metabolic encephalopathy   . Acute blood loss anemia   . Arm DVT (deep venous thromboembolism), acute, left ([place]) 02/13/2019  . Acute encephalopathy 02/13/2019  . Decubitus ulcer of right heel, stage 2 ([place]) 02/10/2019  . Sepsis due to methicillin resistant Staphylococcus aureus (MRSA) ([place]) 02/10/2019  . MRSA bacteremia 02/10/2019  . Altered level of consciousness 02/09/2019  . Adjustment disorder with depressed mood 08/31/2018  . COVID-19 virus infection 08/24/2018  . Acute on chronic respiratory failure with hypoxia ([place]) 08/24/2018  . UTI (urinary tract infection) 08/24/2018  . Allergic rhinitis 09/19/2017  . Uremia of renal origin 09/04/2017  . Chronic neck pain 07/25/2017  . Hyperkalemia 02/15/2017  . Complication from renal dialysis device 12/01/2016  . ESRD (end stage renal disease) ([place]) 01/19/2016  . GI bleed 10/25/2015  . Hypertensive heart disease 10/24/2015  . Chronic diastolic CHF (congestive heart failure) ([place])  10/24/2015  . Unstable angina ([place]) 10/23/2015  . Hypotension 09/02/2015  . Chronic systolic CHF (congestive heart failure) ([place]) 09/02/2015  . Depression 07/27/2015  . Calculus of gallbladder with chronic cholecystitis without obstruction   . Bilateral carotid bruits 11/30/2014  . Low magnesium levels 08/10/2013  . Anemia 08/10/2013  . Constipation 08/10/2013  . Atrial fibrillation, chronic ([place]) 07/30/2013  . Coronary artery disease   . CAD (coronary artery disease) 07/02/2013  . Acute systolic heart failure ([place]) 06/30/2013  . NSTEMI (non-ST elevated myocardial infarction) ([place]) 06/29/2013  . Vertigo 08/25/2012  . Sleep disorder 05/23/2012  . Hypothyroid 04/27/2012  . HTN (hypertension) 04/27/2012  . HLD (hyperlipidemia) 04/27/2012  . Diabetes mellitus, type 2 ([place]) 04/27/2012  . Neuropathy 04/27/2012    Palliative Care Assessment & Plan    Recommendations/Plan:  Continue current care. PEG placement per husband.   Code Status:    Code Status Orders  (From admission, onward)         Start     Ordered   02/21/19 0930  Do not attempt resuscitation (DNR)  Continuous    Question Answer Comment  In the event of cardiac or respiratory ARREST Do not call a "code blue"   In the event of cardiac or respiratory ARREST Do not perform Intubation, CPR, defibrillation or ACLS   In the event of cardiac or respiratory ARREST Use medication by any route, position, wound care, and other measures to relive pain and suffering. May use oxygen, suction and manual treatment of airway obstruction as needed for comfort.      02/21/19 0932        Code Status History    Date Active Date Inactive Code Status Order ID Comments User Context   02/09/2019 [id number] 02/21/2019 0932 Full Code [id number]  Thornell Mule, MD ED   08/24/2018  0022 09/15/2018 1720 Full Code [id number]  Shela Leff, MD Inpatient   09/04/2017 1510 09/06/2017 1920 Full Code [id number]  Epifanio Lesches, MD ED   02/15/2017  1243 02/16/2017 2023 Full Code [id number]  Saundra Shelling, MD Inpatient   10/23/2015 0828 10/23/2015 0908 Full Code [id number]  Saundra Shelling, MD Inpatient   09/03/2015 0130 09/03/2015 1619 Full Code [id number]  Lance Coon, MD Inpatient   07/02/2013 1327 07/14/2013 1540 Full Code [id number]  Nani Skillern, PA-C Inpatient   06/29/2013 2153 07/02/2013 1327 Full Code [id number]  Larey Dresser, MD Inpatient   Advance Care Planning Activity       Prognosis:   Unable to determine   Thank you for allowing the Palliative Medicine Team to assist in the care of this patient.   Total Time 25 min Prolonged Time Billed  no      Greater than 50%  of this time was spent counseling and coordinating care related to the above assessment and plan.  Asencion Gowda, NP  Please contact Palliative Medicine Team phone at [phone] for questions and concerns.

## 2019-03-04 ENCOUNTER — Other Ambulatory Visit (INDEPENDENT_AMBULATORY_CARE_PROVIDER_SITE_OTHER): Payer: Self-pay | Admitting: Nurse Practitioner

## 2019-03-04 ENCOUNTER — Telehealth (INDEPENDENT_AMBULATORY_CARE_PROVIDER_SITE_OTHER): Payer: Self-pay

## 2019-03-04 ENCOUNTER — Encounter
Admission: EM | Disposition: A | Discharge status: Skilled Nursing Facility | Payer: Self-pay | Source: Home / Self Care | Attending: Internal Medicine

## 2019-03-04 ENCOUNTER — Inpatient Hospital Stay: Payer: Medicare Other | Admitting: Anesthesiology

## 2019-03-04 ENCOUNTER — Encounter: Payer: Self-pay | Admitting: *Deleted

## 2019-03-04 ENCOUNTER — Inpatient Hospital Stay: Payer: Medicare Other

## 2019-03-04 DIAGNOSIS — Z978 Presence of other specified devices: Secondary | ICD-10-CM

## 2019-03-04 DIAGNOSIS — T8142XA Infection following a procedure, deep incisional surgical site, initial encounter: Secondary | ICD-10-CM

## 2019-03-04 DIAGNOSIS — Z992 Dependence on renal dialysis: Secondary | ICD-10-CM

## 2019-03-04 DIAGNOSIS — Z931 Gastrostomy status: Secondary | ICD-10-CM

## 2019-03-04 DIAGNOSIS — Z9911 Dependence on respirator [ventilator] status: Secondary | ICD-10-CM

## 2019-03-04 DIAGNOSIS — A419 Sepsis, unspecified organism: Secondary | ICD-10-CM

## 2019-03-04 DIAGNOSIS — N186 End stage renal disease: Secondary | ICD-10-CM

## 2019-03-04 DIAGNOSIS — Z933 Colostomy status: Secondary | ICD-10-CM

## 2019-03-04 HISTORY — PX: AVGG REMOVAL: SHX5153

## 2019-03-04 LAB — CBC
HCT: 24.4 % — ABNORMAL LOW (ref 36.0–46.0)
Hemoglobin: 8.1 g/dL — ABNORMAL LOW (ref 12.0–15.0)
MCH: 29.8 pg (ref 26.0–34.0)
MCHC: 33.2 g/dL (ref 30.0–36.0)
MCV: 89.7 fL (ref 80.0–100.0)
Platelets: 196 10*3/uL (ref 150–400)
RBC: 2.72 MIL/uL — ABNORMAL LOW (ref 3.87–5.11)
RDW: 21 % — ABNORMAL HIGH (ref 11.5–15.5)
WBC: 6.7 10*3/uL (ref 4.0–10.5)
nRBC: 0.6 % — ABNORMAL HIGH (ref 0.0–0.2)

## 2019-03-04 LAB — BASIC METABOLIC PANEL
Anion gap: 9 (ref 5–15)
BUN: 24 mg/dL — ABNORMAL HIGH (ref 8–23)
CO2: 28 mmol/L (ref 22–32)
Calcium: 8.5 mg/dL — ABNORMAL LOW (ref 8.9–10.3)
Chloride: 101 mmol/L (ref 98–111)
Creatinine, Ser: 3.14 mg/dL — ABNORMAL HIGH (ref 0.44–1.00)
GFR calc Af Amer: 17 mL/min — ABNORMAL LOW (ref >60)
GFR calc non Af Amer: 15 mL/min — ABNORMAL LOW (ref >60)
Glucose, Bld: 136 mg/dL — ABNORMAL HIGH (ref 70–99)
Potassium: 4.2 mmol/L (ref 3.5–5.1)
Sodium: 138 mmol/L (ref 135–145)

## 2019-03-04 LAB — MAGNESIUM: Magnesium: 2.1 mg/dL (ref 1.7–2.4)

## 2019-03-04 LAB — BLOOD GAS, ARTERIAL
Acid-Base Excess: 5.6 mmol/L — ABNORMAL HIGH (ref 0.0–2.0)
Bicarbonate: 28.7 mmol/L — ABNORMAL HIGH (ref 20.0–28.0)
FIO2: 0.4
MECHVT: 450 mL
Mechanical Rate: 14
O2 Saturation: 99.3 %
PEEP: 5 cmH2O
Patient temperature: 37
pCO2 arterial: 36 mmHg (ref 32.0–48.0)
pH, Arterial: 7.51 — ABNORMAL HIGH (ref 7.350–7.450)
pO2, Arterial: 135 mmHg — ABNORMAL HIGH (ref 83.0–108.0)

## 2019-03-04 LAB — GLUCOSE, CAPILLARY
Glucose-Capillary: 111 mg/dL — ABNORMAL HIGH (ref 70–99)
Glucose-Capillary: 113 mg/dL — ABNORMAL HIGH (ref 70–99)
Glucose-Capillary: 113 mg/dL — ABNORMAL HIGH (ref 70–99)
Glucose-Capillary: 126 mg/dL — ABNORMAL HIGH (ref 70–99)
Glucose-Capillary: 131 mg/dL — ABNORMAL HIGH (ref 70–99)
Glucose-Capillary: 164 mg/dL — ABNORMAL HIGH (ref 70–99)
Glucose-Capillary: 61 mg/dL — ABNORMAL LOW (ref 70–99)
Glucose-Capillary: 61 mg/dL — ABNORMAL LOW (ref 70–99)
Glucose-Capillary: 75 mg/dL (ref 70–99)
Glucose-Capillary: 81 mg/dL (ref 70–99)
Glucose-Capillary: 89 mg/dL (ref 70–99)
Glucose-Capillary: 97 mg/dL (ref 70–99)

## 2019-03-04 LAB — PHOSPHORUS: Phosphorus: 3.7 mg/dL (ref 2.5–4.6)

## 2019-03-04 SURGERY — REMOVAL OF ARTERIOVENOUS GORETEX GRAFT (AVGG)
Anesthesia: General | Laterality: Right

## 2019-03-04 SURGERY — REMOVAL OF ARTERIOVENOUS GORETEX GRAFT (AVGG)
Anesthesia: General | Site: Arm Lower | Laterality: Right

## 2019-03-04 MED ORDER — SODIUM CHLORIDE 0.9 % IV SOLN
INTRAVENOUS | Status: DC | PRN
  Administered 2019-03-04: 500 mL via INTRAMUSCULAR

## 2019-03-04 MED ORDER — FENTANYL CITRATE (PF) 100 MCG/2ML IJ SOLN
INTRAMUSCULAR | Status: AC
  Filled 2019-03-04: qty 2

## 2019-03-04 MED ORDER — ROCURONIUM BROMIDE 100 MG/10ML IV SOLN
INTRAVENOUS | Status: DC | PRN
  Administered 2019-03-04: 50 mg via INTRAVENOUS

## 2019-03-04 MED ORDER — DEXTROSE 5 % IV SOLN
INTRAVENOUS | Status: DC
  Administered 2019-03-05: 50 mL/h via INTRAVENOUS

## 2019-03-04 MED ORDER — PHENYLEPHRINE HCL (PRESSORS) 10 MG/ML IV SOLN
INTRAVENOUS | Status: DC | PRN
  Administered 2019-03-04: 100 ug via INTRAVENOUS

## 2019-03-04 MED ORDER — HEPARIN (PORCINE) 25000 UT/250ML-% IV SOLN
1100.0000 [IU]/h | INTRAVENOUS | Status: AC
  Administered 2019-03-04: 1100 [IU]/h via INTRAVENOUS
  Filled 2019-03-04: qty 250

## 2019-03-04 MED ORDER — DEXTROSE 50 % IV SOLN: 1.0000 | Freq: Once | INTRAVENOUS | Status: AC

## 2019-03-04 MED ORDER — FENTANYL CITRATE (PF) 100 MCG/2ML IJ SOLN
INTRAMUSCULAR | Status: DC | PRN
  Administered 2019-03-04 (×2): 25 ug via INTRAVENOUS

## 2019-03-04 MED ORDER — DEXTROSE 50 % IV SOLN
INTRAVENOUS | Status: AC
  Administered 2019-03-04: 50 mL via INTRAVENOUS
  Filled 2019-03-04: qty 50

## 2019-03-04 MED ORDER — HEPARIN SODIUM (PORCINE) 1000 UNIT/ML IJ SOLN
INTRAMUSCULAR | Status: DC | PRN
  Administered 2019-03-04: 3000 [IU] via INTRAVENOUS

## 2019-03-04 MED ORDER — PROPOFOL 10 MG/ML IV BOLUS
INTRAVENOUS | Status: AC
  Filled 2019-03-04: qty 20

## 2019-03-04 MED ORDER — SODIUM CHLORIDE 0.9 % IV SOLN: INTRAVENOUS | Status: DC | PRN

## 2019-03-04 MED ORDER — CEFAZOLIN SODIUM-DEXTROSE 2-4 GM/100ML-% IV SOLN
2.0000 g | INTRAVENOUS | Status: AC
  Administered 2019-03-04: 2 g via INTRAVENOUS
  Filled 2019-03-04: qty 100

## 2019-03-04 SURGICAL SUPPLY — 56 items
BAG DECANTER FOR FLEXI CONT (MISCELLANEOUS) ×3 IMPLANT
BLADE SURG SZ11 CARB STEEL (BLADE) ×3 IMPLANT
BNDG GAUZE 4.5X4.1 6PLY STRL (MISCELLANEOUS) ×2 IMPLANT
BOOT SUTURE AID YELLOW STND (SUTURE) ×3 IMPLANT
CANISTER SUCT 1200ML W/VALVE (MISCELLANEOUS) ×3 IMPLANT
CHLORAPREP W/TINT 26 (MISCELLANEOUS) ×3 IMPLANT
CLIP SPRNG 6 S-JAW DBL (CLIP) ×1 IMPLANT
CLIP SPRNG 6MM S-JAW DBL (CLIP) ×3
COVER WAND RF STERILE (DRAPES) ×3 IMPLANT
DERMABOND ADVANCED (GAUZE/BANDAGES/DRESSINGS) ×2
DERMABOND ADVANCED .7 DNX12 (GAUZE/BANDAGES/DRESSINGS) ×1 IMPLANT
DRSG TEGADERM 2-3/8X2-3/4 SM (GAUZE/BANDAGES/DRESSINGS) ×4 IMPLANT
DRSG TELFA 4X3 1S NADH ST (GAUZE/BANDAGES/DRESSINGS) ×4 IMPLANT
ELECT CAUTERY BLADE 6.4 (BLADE) ×3 IMPLANT
ELECT REM PT RETURN 9FT ADLT (ELECTROSURGICAL) ×3
ELECTRODE REM PT RTRN 9FT ADLT (ELECTROSURGICAL) ×1 IMPLANT
GAUZE 4X4 16PLY RFD (DISPOSABLE) ×2 IMPLANT
GLOVE BIO SURGEON STRL SZ7 (GLOVE) ×6 IMPLANT
GLOVE INDICATOR 7.5 STRL GRN (GLOVE) ×3 IMPLANT
GOWN STRL REUS W/ TWL LRG LVL3 (GOWN DISPOSABLE) ×1 IMPLANT
GOWN STRL REUS W/ TWL XL LVL3 (GOWN DISPOSABLE) ×2 IMPLANT
GOWN STRL REUS W/TWL LRG LVL3 (GOWN DISPOSABLE) ×2
GOWN STRL REUS W/TWL XL LVL3 (GOWN DISPOSABLE) ×4
HEMOSTAT SURGICEL 2X3 (HEMOSTASIS) ×3 IMPLANT
IV NS 500ML (IV SOLUTION) ×2
IV NS 500ML BAXH (IV SOLUTION) ×1 IMPLANT
KIT PREVENA INCISION MGT 13 (CANNISTER) ×2 IMPLANT
LABEL OR SOLS (LABEL) ×3 IMPLANT
LOOP RED MAXI  1X406MM (MISCELLANEOUS) ×2
LOOP VESSEL MAXI 1X406 RED (MISCELLANEOUS) ×1 IMPLANT
LOOP VESSEL MINI 0.8X406 BLUE (MISCELLANEOUS) ×1 IMPLANT
LOOPS BLUE MINI 0.8X406MM (MISCELLANEOUS) ×2
NDL FILTER BLUNT 18X1 1/2 (NEEDLE) ×1 IMPLANT
NEEDLE FILTER BLUNT 18X 1/2SAF (NEEDLE) ×2
NEEDLE FILTER BLUNT 18X1 1/2 (NEEDLE) ×1 IMPLANT
NS IRRIG 500ML POUR BTL (IV SOLUTION) ×3 IMPLANT
PACK EXTREMITY ARMC (MISCELLANEOUS) ×3 IMPLANT
PAD PREP 24X41 OB/GYN DISP (PERSONAL CARE ITEMS) ×3 IMPLANT
SOLUTION CELL SAVER (CLIP) ×1 IMPLANT
STAPLER SKIN PROX 35W (STAPLE) ×2 IMPLANT
STOCKINETTE 48X4 2 PLY STRL (GAUZE/BANDAGES/DRESSINGS) ×1 IMPLANT
STOCKINETTE STRL 4IN 9604848 (GAUZE/BANDAGES/DRESSINGS) ×3 IMPLANT
STRAP SAFETY 5IN WIDE (MISCELLANEOUS) ×3 IMPLANT
SUT MNCRL AB 4-0 PS2 18 (SUTURE) ×3 IMPLANT
SUT PROLENE 6 0 BV (SUTURE) ×13 IMPLANT
SUT SILK 2 0 (SUTURE) ×2
SUT SILK 2 0 SH (SUTURE) ×3 IMPLANT
SUT SILK 2-0 18XBRD TIE 12 (SUTURE) ×1 IMPLANT
SUT SILK 3 0 (SUTURE) ×2
SUT SILK 3-0 18XBRD TIE 12 (SUTURE) ×1 IMPLANT
SUT SILK 4 0 (SUTURE) ×2
SUT SILK 4-0 18XBRD TIE 12 (SUTURE) ×1 IMPLANT
SUT VIC AB 3-0 SH 27 (SUTURE) ×4
SUT VIC AB 3-0 SH 27X BRD (SUTURE) ×1 IMPLANT
SYR 20ML LL LF (SYRINGE) ×3 IMPLANT
SYR 3ML LL SCALE MARK (SYRINGE) ×3 IMPLANT

## 2019-03-04 NOTE — Progress Notes (Signed)
ID  Intubated BP (!) 134/59   Pulse (!) 55   Temp 97.9 F (36.6 C) (Axillary)   Resp 15   Ht 5\' 4"  (1.626 m)   Wt 75.5 kg   SpO2 100%   BMI 28.57 kg/m    Peg in place Rt AV graft has been removed and there is a wound vac in place Chest /l air entry HS s1s2 abd soft Rectal foley Left femoral line Left IJ Arms bruised   CBC Latest Ref Rng & Units 03/04/2019 03/03/2019 03/03/2019  WBC 4.0 - 10.5 K/uL 6.7 10.0 10.0  Hemoglobin 12.0 - 15.0 g/dL 8.1(L) 8.6(L) 8.9(L)  Hematocrit 36.0 - 46.0 % 24.4(L) 26.6(L) 26.3(L)  Platelets 150 - 400 K/uL 196 219 172    CMP Latest Ref Rng & Units 03/04/2019 03/03/2019 03/03/2019  Glucose 70 - 99 mg/dL 136(H) 74 125(H)  BUN 8 - 23 mg/dL 24(H) 22 19  Creatinine 0.44 - 1.00 mg/dL 3.14(H) 2.95(H) 2.44(H)  Sodium 135 - 145 mmol/L 138 138 139  Potassium 3.5 - 5.1 mmol/L 4.2 4.0 4.0  Chloride 98 - 111 mmol/L 101 100 101  CO2 22 - 32 mmol/L 28 28 26   Calcium 8.9 - 10.3 mg/dL 8.5(L) 8.4(L) 8.3(L)  Total Protein 6.5 - 8.1 g/dL - - -  Total Bilirubin 0.3 - 1.2 mg/dL - - -  Alkaline Phos 38 - 126 U/L - - -  AST 15 - 41 U/L - - -  ALT 0 - 44 U/L - - -    Micro 12/7, 12/9, 12/11, 12/13 BC positive for MRSA 02/18/19 BC NG   Impression/recommendation  MRSA bacteremia with no clear source other than AV graft with stents. This was removed today. Cultures sent Wound vac Pt on vancomycin Will need for 6 weeks  Loose stools secondary to tube feeds  Persistent encephalopathy- metabolic?  Paroxysmal Afib  Left arm DVT on heparin  Long COVID- post COVID morbidity  Discussed the management with the care team

## 2019-03-04 NOTE — Telephone Encounter (Signed)
Dr. Lucky Cowboy: Mickel Baas, can we put her on for tomorrow in the OR for AV graft removal   Patient was scheduled on 03/03/2019 for surgery on 03/04/2019.

## 2019-03-04 NOTE — Progress Notes (Signed)
HD Tx completed   03/04/19 2312  Vital Signs  Pulse Rate 87  Resp 15  BP (!) 120/53  Oxygen Therapy  SpO2 96 %  O2 Device Ventilator  FiO2 (%) 30 %  End [place] CO2 (EtCO2) 33  Pain Assessment  Pain Scale Faces  Pain Score 0  During Hemodialysis Assessment  Blood Flow Rate (mL/min) 400 mL/min  Arterial Pressure (mmHg) -140 mmHg  Venous Pressure (mmHg) 130 mmHg  Transmembrane Pressure (mmHg) 60 mmHg  Ultrafiltration Rate (mL/min) 400 mL/min  Dialysate Flow Rate (mL/min) 600 ml/min  Conductivity: Machine  14  HD Safety Checks Performed Yes  Intra-Hemodialysis Comments Tx completed;Tolerated well

## 2019-03-04 NOTE — Plan of Care (Signed)
  Problem: Clinical Measurements: Goal: Ability to maintain clinical measurements within normal limits will improve Outcome: Progressing Goal: Will remain free from infection Outcome: Progressing Goal: Diagnostic test results will improve Outcome: Progressing Goal: Respiratory complications will improve Outcome: Progressing Goal: Cardiovascular complication will be avoided Outcome: Progressing  Pt tolerated HD Tx well and got 1000 ml of fluids removed. Pt continues to receive pressor, BP continues to be WDL during Tx.

## 2019-03-04 NOTE — Progress Notes (Signed)
[name], female   DOB: [birth year], 63 y.o.   MRN: [id number] [facility] PROGRESS NOTE  Jamie Arias:[id number] DOB: 1956-01-12 DOA: 02/09/2019 PCP: [name], MD  HPI/Subjective: Patient seen this morning and was intubated.  She was intubated after PEG procedure last night.  She was kept intubated for her procedure today.  Patient seen on ventilator this morning without sedation and she did not respond to painful stimuli.  Objective: Vitals:   03/04/19 1000 03/04/19 1300  BP: (!) 121/58 (!) 108/55  Pulse: (!) 59 (!) 49  Resp: 14 14  Temp:  98.3 F (36.8 C)  SpO2: 100% 100%    Filed Weights   02/25/19 0842 02/25/19 1200 03/02/19 0930  Weight: 74.1 kg 73.2 kg 75.5 kg    ROS: Review of Systems  Unable to perform ROS: Acuity of condition   Exam: Physical Exam  Constitutional: She appears lethargic.  HENT:  Nose: No mucosal edema.  Eyes: Pupils are equal, round, and reactive to light. Conjunctivae and lids are normal.  Neck: Carotid bruit is not present.  Cardiovascular: S1 normal and S2 normal. An irregularly irregular rhythm present. Exam reveals no gallop.  No murmur heard. Respiratory: No respiratory distress. She has decreased breath sounds in the right lower field and the left lower field. She has no wheezes. She has no rhonchi. She has no rales.  GI: Soft. Bowel sounds are normal. There is no abdominal tenderness.  Musculoskeletal:     Right ankle: No swelling.     Left ankle: No swelling.  Lymphadenopathy:    She has no cervical adenopathy.  Neurological: She appears lethargic.  Unresponsive to sternal rub today  Skin: Skin is warm. Nails show no clubbing.  Left heel stage II decubitus ulcer with black eschar. Right heel stage II decubitus ulcer As per nursing staff stage I decubitus ulcer sacrum  Psychiatric:  Unresponsive to sternal rub today      Data Reviewed: Basic Metabolic Panel: Recent Labs  Lab 02/28/19 0626  03/01/19 0429 03/01/19 2014 03/02/19 0329 03/03/19 0636 03/03/19 2100 03/04/19 0417  NA 138 135  --  134* 139 138 138  K 3.4* 4.1 4.2 4.4 4.0 4.0 4.2  CL 97* 96*  --  96* 101 100 101  CO2 29 26  --  _0 GLUCOSE 365* 256*  --  130* 125* 74 136*  BUN 15 21  --  28* 19 22 24*  CREATININE 2.46* 3.03*  --  3.61* 2.44* 2.95* 3.14*  CALCIUM 8.0* 8.2*  --  8.3* 8.3* 8.4* 8.5*  MG 1.8 1.9  --  1.9 2.0  --  2.1  PHOS 2.1* 1.9* 3.2 2.9 1.8*  --  3.7   Liver Function Tests: Recent Labs  Lab 02/26/19 0231 02/27/19 0413 02/28/19 0626 03/01/19 0429  AST _1 ALT _2 ALKPHOS 59 59 63 58  BILITOT 1.9* 1.7* 1.0 0.9  PROT 4.9* 5.0* 4.9* 4.6*  ALBUMIN 2.1*  2.1* 2.1* 2.0* 1.8*   CBC: Recent Labs  Lab 03/01/19 0429 03/02/19 0329 03/03/19 0636 03/03/19 2139 03/04/19 0417  WBC 8.3 10.4 10.0 10.0 6.7  HGB 8.6* 8.2* 8.9* 8.6* 8.1*  HCT 25.0* 24.1* 26.3* 26.6* 24.4*  MCV 87.7 87.3 88.3 93.7 89.7  PLT 184 197 172 219 196   Cardiac Enzymes: Recent Labs  Lab 03/02/19 0330  CKTOTAL 31*   BNP (last 3 results) Recent Labs  03/01/19 0429  BNP 720.0*     CBG: Recent Labs  Lab 03/04/19 0230 03/04/19 0420 03/04/19 0428 03/04/19 0734 03/04/19 1252  GLUCAP 89 126* 111* 97 113*     Studies: X-ray chest PA or AP  Result Date: 03/03/2019 CLINICAL DATA:  ETT placement EXAM: CHEST  1 VIEW COMPARISON:  03/01/2019 FINDINGS: Endotracheal tube with the tip 4 cm above the carina. Dual lumen left-sided central venous catheter with the tip projecting over the SVC. Bilateral mild interstitial thickening. No pleural effusion or pneumothorax. Linear band of airspace disease in the right lung likely reflecting atelectasis. Stable cardiomegaly.  Prior CABG.  No acute osseous abnormality. IMPRESSION: 1. Cardiomegaly with pulmonary vascular congestion. 2. Linear band of airspace disease in the right lung likely reflecting atelectasis. Electronically Signed   By: Kathreen Devoid   On: 03/03/2019 17:53   DG Chest Port 1 View  Result Date: 03/04/2019 CLINICAL DATA:  Acute respiratory failure. EXAM: PORTABLE CHEST 1 VIEW COMPARISON:  March 03, 2019. FINDINGS: Stable cardiomediastinal silhouette. Endotracheal tube is unchanged in position. Left internal jugular catheter is unchanged in position. No pneumothorax or significant pleural effusion is noted. Status post coronary bypass graft. Mild bibasilar subsegmental atelectasis is noted. Bony thorax is unremarkable. IMPRESSION: Stable support apparatus. Stable mild bibasilar subsegmental atelectasis. Electronically Signed   By: Marijo Conception M.D.   On: 03/04/2019 07:54    Scheduled Meds: . aspirin  81 mg Oral Daily  . B-complex with vitamin C  1 tablet Per Tube Daily  . chlorhexidine gluconate (MEDLINE KIT)  15 mL Mouth Rinse BID  . Chlorhexidine Gluconate Cloth  6 each Topical Q0600  . epoetin (EPOGEN/PROCRIT) injection  4,000 Units Intravenous Q M,W,F-HD  . fluticasone  2 spray Each Nare Daily  . free water  30 mL Per Tube Q4H  . insulin aspart  0-15 Units Subcutaneous Q4H  . levothyroxine  88 mcg Oral QAC breakfast  . mouth rinse  15 mL Mouth Rinse q12n4p  . mouth rinse  15 mL Mouth Rinse 10 times per day  . midodrine  10 mg Oral TID WC  . pantoprazole (PROTONIX) IV  40 mg Intravenous Q24H  . venlafaxine  75 mg Oral BID WC  . zinc sulfate  220 mg Oral Daily   Continuous Infusions: . sodium chloride Stopped (03/01/19 0429)  . sodium chloride    . dexmedetomidine (PRECEDEX) IV infusion Stopped (03/04/19 1005)  . dextrose Stopped (03/04/19 0940)  . norepinephrine (LEVOPHED) Adult infusion Stopped (03/04/19 0220)  . vancomycin Stopped (03/02/19 1752)    Assessment/Plan:   1. Sepsis secondary to MRSA bacteremia.  Currently on vancomycin.   Appreciate ID consultation.  Will need long course of IV therapy.  Vascular today removed her graft which could be the source of infection. 2. End-stage renal  disease on hemodialysis.  Tunneled catheter placed by vascular.  Graft removal today by vascular 3. Acute metabolic encephalopathy.  Mental status still impaired.  MRI of the brain negative.  EEG negative.  Ammonia normal.  TSH normal.  In speaking with prior rounding physicians patient's mental status has been impaired for a while now.  Overall prognosis poor.  Patient is total care.  Wondering if her mental status could be post Covid encephalopathy. 4. Left arm DVT.  Restart heparin once cleared by vascular to do so. 5. Paroxysmal atrial  fibrillation on amiodarone.  Restart anticoagulation once cleared by vascular to do so. 6. Chronic diastolic congestive heart failure.  Dialysis  to manage fluid status. 7. Type 2 diabetes mellitus.  Held Lantus and short acting insulin until started back on tube feeds. 8. Hypothyroidism unspecified on Synthroid 9. Overall prognosis is poor.  Palliative care following.  Patient a DNR. 10. Diarrhea.  Hold off on stool studies at this time.  11. Stage II decubiti right heel, left heel and stage I decubiti on buttock 12. Nutrition: PEG placed yesterday.  Hopefully can start tube feedings soon. 13. Intubated for PEG procedure and kept intubated for procedure today.  Hopefully critical care team can extubate soon.  Code Status:     Code Status Orders  (From admission, onward)         Start     Ordered   02/21/19 0930  Do not attempt resuscitation (DNR)  Continuous    Question Answer Comment  In the event of cardiac or respiratory ARREST Do not call a "code blue"   In the event of cardiac or respiratory ARREST Do not perform Intubation, CPR, defibrillation or ACLS   In the event of cardiac or respiratory ARREST Use medication by any route, position, wound care, and other measures to relive pain and suffering. May use oxygen, suction and manual treatment of airway obstruction as needed for comfort.      02/21/19 0932        Code Status History    Date  Active Date Inactive Code Status Order ID Comments User Context   02/09/2019 1755 02/21/2019 0932 Full Code [id number]  Thornell Mule, MD ED   08/24/2018 0022 09/15/2018 1720 Full Code [id number]  Shela Leff, MD Inpatient   09/04/2017 1510 09/06/2017 1920 Full Code [id number]  Epifanio Lesches, MD ED   02/15/2017 1243 02/16/2017 2023 Full Code [id number]  Saundra Shelling, MD Inpatient   10/23/2015 0828 10/23/2015 0908 Full Code [id number]  Saundra Shelling, MD Inpatient   09/03/2015 0130 09/03/2015 1619 Full Code [id number]  Lance Coon, MD Inpatient   07/02/2013 1327 07/14/2013 1540 Full Code [id number]  Nani Skillern, PA-C Inpatient   06/29/2013 2153 07/02/2013 1327 Full Code [id number]  Larey Dresser, MD Inpatient   Advance Care Planning Activity     Family Communication: Spoke with husband  Disposition Plan: Transitional care team to look into LTAC facility  Consultants:  Nephrology  Infectious disease  Antibiotics:  Vancomycin  Time spent: 26 minutes  [place]

## 2019-03-04 NOTE — Transfer of Care (Signed)
Immediate Anesthesia Transfer of Care Note  Patient: Jamie Arias  Procedure(s) Performed: REMOVAL OF ARTERIOVENOUS GORETEX GRAFT ([place]) (Right Arm Lower)  Patient Location: ICU  Anesthesia Type:General  Level of Consciousness: sedated, unresponsive and Patient remains intubated per anesthesia plan  Airway & Oxygen Therapy: Patient remains intubated per anesthesia plan  Post-op Assessment: Report given to RN and Post -op Vital signs reviewed and stable  Post vital signs: Reviewed and stable  Last Vitals:  Vitals Value Taken Time  BP    Temp    Pulse    Resp    SpO2      Last Pain:  Vitals:   03/04/19 0800  TempSrc: Axillary  PainSc:          Complications: No apparent anesthesia complications

## 2019-03-04 NOTE — Anesthesia Preprocedure Evaluation (Signed)
Anesthesia Evaluation  Patient identified by MRN, date of birth, ID band Patient awake    Reviewed: Allergy & Precautions, NPO status , Patient's Chart, lab work & pertinent test results, reviewed documented beta blocker date and time   Airway Mallampati: III  TM Distance: >3 FB     Dental  (+) Chipped   Pulmonary           Cardiovascular hypertension, Pt. on medications and Pt. on home beta blockers + angina + CAD, + Past MI, + CABG and +CHF       Neuro/Psych PSYCHIATRIC DISORDERS Depression    GI/Hepatic GERD  ,  Endo/Other  diabetes, Type 2Hypothyroidism   Renal/GU ESRFRenal disease     Musculoskeletal   Abdominal   Peds  Hematology  (+) anemia ,   Anesthesia Other Findings   Reproductive/Obstetrics                             Anesthesia Physical Anesthesia Plan  ASA: IV  Anesthesia Plan: General   Post-op Pain Management:    Induction: Intravenous  PONV Risk Score and Plan:   Airway Management Planned: Oral ETT  Additional Equipment:   Intra-op Plan:   Post-operative Plan:   Informed Consent: I have reviewed the patients History and Physical, chart, labs and discussed the procedure including the risks, benefits and alternatives for the proposed anesthesia with the patient or authorized representative who has indicated his/her understanding and acceptance.       Plan Discussed with: CRNA  Anesthesia Plan Comments:         Anesthesia Quick Evaluation

## 2019-03-04 NOTE — Progress Notes (Signed)
[facility]  ROUNDING NOTE   Subjective:   Patient is critically ill Patient underwent PEG placement yesterday had experienced hypotension and agitation overnight therefore was placed on Levophed which has been weaned off.  Continues to be on sedation with Precedex.  PEG tube in place. Patient is now on ventilator support.  FiO2 30%   Objective:  Vital signs in last 24 hours:  Temp:  [97 F (36.1 C)-98.4 F (36.9 C)] 98 F (36.7 C) (12/30 0800) Pulse Rate:  [55-113] 62 (12/30 0900) Resp:  [0-20] 14 (12/30 0900) BP: (88-160)/(29-59) 127/50 (12/30 0900) SpO2:  [98 %-100 %] 100 % (12/30 0900) FiO2 (%):  [40 %] 40 % (12/30 0801)  Weight change:  Filed Weights   02/25/19 0842 02/25/19 1200 03/02/19 0930  Weight: 74.1 kg 73.2 kg 75.5 kg    Intake/Output: I/O last 3 completed shifts: In: 475.7 [I.V.:395.7; NG/GT:30; IV Piggyback:50] Out: 600 [Stool:600]   Intake/Output this shift:  Total I/O In: 241.5 [I.V.:211.5; Other:30] Out: 200 [Stool:200]  Physical Exam: General: NAD, laying in bed  Head:  ET tube  Eyes: Anicteric, pupils normal  Lungs:   Ventilator assisted  Heart: Regular rate and rhythm  Abdomen:  Soft, nontender, PEG tube in place  Extremities: + peripheral edema b/l, feet in b/l soft support  Neurologic:  Sedated  Access: Right AVG, Left IJ PC  Rectal rube in place  Basic Metabolic Panel: Recent Labs  Lab 02/28/19 0626 03/01/19 0429 03/01/19 2014 03/02/19 0329 03/03/19 0636 03/03/19 2100 03/04/19 0417  NA 138 135  --  134* 139 138 138  K 3.4* 4.1 4.2 4.4 4.0 4.0 4.2  CL 97* 96*  --  96* 101 100 101  CO2 29 26  --  '22 26 28 28  '$ GLUCOSE 365* 256*  --  130* 125* 74 136*  BUN 15 21  --  28* 19 22 24*  CREATININE 2.46* 3.03*  --  3.61* 2.44* 2.95* 3.14*  CALCIUM 8.0* 8.2*  --  8.3* 8.3* 8.4* 8.5*  MG 1.8 1.9  --  1.9 2.0  --  2.1  PHOS 2.1* 1.9* 3.2 2.9 1.8*  --  3.7    Liver Function Tests: Recent Labs  Lab 02/26/19 0231  02/27/19 0413 02/28/19 0626 03/01/19 0429  AST '26 22 22 22  '$ ALT '20 18 19 17  '$ ALKPHOS 59 59 63 58  BILITOT 1.9* 1.7* 1.0 0.9  PROT 4.9* 5.0* 4.9* 4.6*  ALBUMIN 2.1*  2.1* 2.1* 2.0* 1.8*   No results for input(s): LIPASE, AMYLASE in the last 168 hours. No results for input(s): AMMONIA in the last 168 hours.  CBC: Recent Labs  Lab 03/01/19 0429 03/02/19 0329 03/03/19 0636 03/03/19 2139 03/04/19 0417  WBC 8.3 10.4 10.0 10.0 6.7  HGB 8.6* 8.2* 8.9* 8.6* 8.1*  HCT 25.0* 24.1* 26.3* 26.6* 24.4*  MCV 87.7 87.3 88.3 93.7 89.7  PLT 184 197 172 219 196    Cardiac Enzymes: Recent Labs  Lab 03/02/19 0330  CKTOTAL 31*    BNP: Invalid input(s): POCBNP  CBG: Recent Labs  Lab 03/04/19 0058 03/04/19 0230 03/04/19 0420 03/04/19 0428 03/04/19 0734  GLUCAP 81 89 126* 111* 72    Microbiology: Results for orders placed or performed during the hospital encounter of 02/09/19  Culture, blood (routine x 2)     Status: Abnormal   Collection Time: 02/09/19  2:29 PM   Specimen: BLOOD LEFT ARM  Result Value Ref Range Status   Specimen Description  Final    BLOOD LEFT ARM Performed at [facility], [address], [place], [place] [address]    Special Requests   Final    BOTTLES DRAWN AEROBIC AND ANAEROBIC Blood Culture adequate volume Performed at [facility], [address] [place], [place] [address]    Culture  Setup Time   Final    GRAM POSITIVE COCCI IN BOTH AEROBIC AND ANAEROBIC BOTTLES CRITICAL VALUE NOTED.  VALUE IS CONSISTENT WITH PREVIOUSLY REPORTED AND CALLED VALUE. Performed at [facility], [address] [place], [place] [address]    Culture (A)  Final    STAPHYLOCOCCUS AUREUS SUSCEPTIBILITIES PERFORMED ON PREVIOUS CULTURE WITHIN THE LAST 5 DAYS. Performed at [facility], [place] [address], [place], [place] [address]    Report Status 02/12/2019 FINAL  Final  Culture, blood (routine x 2)     Status: Abnormal   Collection  Time: 02/09/19  2:38 PM   Specimen: BLOOD LEFT ARM  Result Value Ref Range Status   Specimen Description   Final    BLOOD LEFT ARM Performed at [facility], [address], [place], [place] [address]    Special Requests   Final    BOTTLES DRAWN AEROBIC AND ANAEROBIC Blood Culture adequate volume Performed at [facility], [address] [place], [place] [address]    Culture  Setup Time   Final    GRAM POSITIVE COCCI IN BOTH AEROBIC AND ANAEROBIC BOTTLES CRITICAL RESULT CALLED TO, READ BACK BY AND VERIFIED WITH: [place] 0240 02/10/2019 HNM Performed at [facility], [place] [address], [place], [place] [address]    Culture METHICILLIN RESISTANT STAPHYLOCOCCUS AUREUS (A)  Final   Report Status 02/12/2019 FINAL  Final   Organism ID, Bacteria METHICILLIN RESISTANT STAPHYLOCOCCUS AUREUS  Final      Susceptibility   Methicillin resistant staphylococcus aureus - MIC*    CIPROFLOXACIN >=8 RESISTANT Resistant     ERYTHROMYCIN >=8 RESISTANT Resistant     GENTAMICIN <=0.5 SENSITIVE Sensitive     OXACILLIN >=4 RESISTANT Resistant     TETRACYCLINE <=1 SENSITIVE Sensitive     VANCOMYCIN 1 SENSITIVE Sensitive     TRIMETH/SULFA <=10 SENSITIVE Sensitive     CLINDAMYCIN RESISTANT Resistant     RIFAMPIN <=0.5 SENSITIVE Sensitive     Inducible Clindamycin POSITIVE Resistant     * METHICILLIN RESISTANT STAPHYLOCOCCUS AUREUS  Blood Culture ID Panel (Reflexed)     Status: Abnormal   Collection Time: 02/09/19  2:38 PM  Result Value Ref Range Status   Enterococcus species NOT DETECTED NOT DETECTED Final   Listeria monocytogenes NOT DETECTED NOT DETECTED Final   Staphylococcus species DETECTED (A) NOT DETECTED Final    Comment: CRITICAL RESULT CALLED TO, READ BACK BY AND VERIFIED WITH: Hart Robinsons PHARMD [id number] 02/10/2019 HNM    Staphylococcus aureus (BCID) DETECTED (A) NOT DETECTED Final    Comment: Methicillin (oxacillin)-resistant Staphylococcus aureus (MRSA). MRSA is  predictably resistant to beta-lactam antibiotics (except ceftaroline). Preferred therapy is vancomycin unless clinically contraindicated. Patient requires contact precautions if  hospitalized. CRITICAL RESULT CALLED TO, READ BACK BY AND VERIFIED WITH: Hart Robinsons PHARMD [id number] 02/10/2019 HNM    Methicillin resistance DETECTED (A) NOT DETECTED Final    Comment: CRITICAL RESULT CALLED TO, READ BACK BY AND VERIFIED WITH: Hart Robinsons PHARMD [id number] 02/10/2019 HNM    Streptococcus species NOT DETECTED NOT DETECTED Final   Streptococcus agalactiae NOT DETECTED NOT DETECTED Final   Streptococcus pneumoniae  NOT DETECTED NOT DETECTED Final   Streptococcus pyogenes NOT DETECTED NOT DETECTED Final   Acinetobacter baumannii NOT DETECTED NOT DETECTED Final   Enterobacteriaceae species NOT DETECTED NOT DETECTED Final   Enterobacter cloacae complex NOT DETECTED NOT DETECTED Final   Escherichia coli NOT DETECTED NOT DETECTED Final   Klebsiella oxytoca NOT DETECTED NOT DETECTED Final   Klebsiella pneumoniae NOT DETECTED NOT DETECTED Final   Proteus species NOT DETECTED NOT DETECTED Final   Serratia marcescens NOT DETECTED NOT DETECTED Final   Haemophilus influenzae NOT DETECTED NOT DETECTED Final   Neisseria meningitidis NOT DETECTED NOT DETECTED Final   Pseudomonas aeruginosa NOT DETECTED NOT DETECTED Final   Candida albicans NOT DETECTED NOT DETECTED Final   Candida glabrata NOT DETECTED NOT DETECTED Final   Candida krusei NOT DETECTED NOT DETECTED Final   Candida parapsilosis NOT DETECTED NOT DETECTED Final   Candida tropicalis NOT DETECTED NOT DETECTED Final    Comment: Performed at [facility], [place], [place] [address]  SARS CORONAVIRUS 2 (TAT 6-24 HRS) Nasopharyngeal Nasopharyngeal Swab     Status: None   Collection Time: 02/09/19  2:54 PM   Specimen: Nasopharyngeal Swab  Result Value Ref Range Status   SARS Coronavirus 2 NEGATIVE NEGATIVE Final    Comment:  (NOTE) SARS-CoV-2 target nucleic acids are NOT DETECTED. The SARS-CoV-2 RNA is generally detectable in upper and lower respiratory specimens during the acute phase of infection. Negative results do not preclude SARS-CoV-2 infection, do not rule out co-infections with other pathogens, and should not be used as the sole basis for treatment or other patient management decisions. Negative results must be combined with clinical observations, patient history, and epidemiological information. The expected result is Negative. Fact Sheet for Patients: SugarRoll.be Fact Sheet for Healthcare Providers: https://www.woods-mathews.com/ This test is not yet approved or cleared by the [place] FDA and  has been authorized for detection and/or diagnosis of SARS-CoV-2 by FDA under an Emergency Use Authorization (EUA). This EUA will remain  in effect (meaning this test can be used) for the duration of the COVID-19 declaration under Section 56 4(b)(1) of the Act, 21 U.S.C. section 360bbb-3(b)(1), unless the authorization is terminated or revoked sooner. Performed at [facility], [place] [address], [place], [place] [address]   MRSA PCR Screening     Status: Abnormal   Collection Time: 02/10/19  5:03 PM   Specimen: Nasopharyngeal  Result Value Ref Range Status   MRSA by PCR POSITIVE (A) NEGATIVE Final    Comment:        The GeneXpert MRSA Assay (FDA approved for NASAL specimens only), is one component of a comprehensive MRSA colonization surveillance program. It is not intended to diagnose MRSA infection nor to guide or monitor treatment for MRSA infections. RESULT CALLED TO, READ BACK BY AND VERIFIED WITH: ALEKSEY FRASER '@[id number]'$  02/10/19 MJU Performed at [facility], [address] [place], [place] [address]   Culture, blood (routine x 2)     Status: Abnormal   Collection Time: 02/11/19 12:31 PM   Specimen: BLOOD  Result Value Ref  Range Status   Specimen Description   Final    BLOOD PORTA CATH Performed at [place] [address] [place], [place] [address]    Special Requests   Final    BOTTLES DRAWN AEROBIC AND ANAEROBIC Blood Culture adequate volume Performed at [facility], [address], [place],  [address]    Culture  Setup  Time   Final    GRAM POSITIVE COCCI ANAEROBIC BOTTLE ONLY CRITICAL RESULT CALLED TO, READ BACK BY AND VERIFIED WITH: Hart Robinsons AT 0277 02/12/2019 [facility]  Performed at [facility], [address] [place], [place] [address]    Culture (A)  Final    STAPHYLOCOCCUS AUREUS SUSCEPTIBILITIES PERFORMED ON PREVIOUS CULTURE WITHIN THE LAST 5 DAYS. Performed at [facility], [place] [address] [place], [place] [address]    Report Status 02/13/2019 FINAL  Final  Culture, blood (routine x 2)     Status: None   Collection Time: 02/11/19  3:57 PM   Specimen: BLOOD  Result Value Ref Range Status   Specimen Description BLOOD PORTA CATH  Final   Special Requests   Final    BOTTLES DRAWN AEROBIC AND ANAEROBIC Blood Culture adequate volume   Culture   Final    NO GROWTH 5 DAYS Performed at [facility], [address] [place], [place] [address]    Report Status 02/16/2019 FINAL  Final  CULTURE, BLOOD (ROUTINE X 2) w Reflex to ID Panel     Status: Abnormal   Collection Time: 02/13/19  2:18 PM   Specimen: BLOOD LEFT HAND  Result Value Ref Range Status   Specimen Description BLOOD LEFT HAND  Final   Special Requests   Final    BOTTLES DRAWN AEROBIC AND ANAEROBIC Blood Culture results may not be optimal due to an inadequate volume of blood received in culture bottles   Culture  Setup Time   Final    AEROBIC BOTTLE ONLY GRAM POSITIVE COCCI IN CLUSTERS CRITICAL RESULT CALLED TO, READ BACK BY AND VERIFIED WITH: Herbert Pun AT 0962 ON 02/14/2019 [place]. Performed at [facility], [place] [address] [place], [place] [address]    Culture  METHICILLIN RESISTANT STAPHYLOCOCCUS AUREUS (A)  Final   Report Status 02/16/2019 FINAL  Final   Organism ID, Bacteria METHICILLIN RESISTANT STAPHYLOCOCCUS AUREUS  Final      Susceptibility   Methicillin resistant staphylococcus aureus - MIC*    CIPROFLOXACIN >=8 RESISTANT Resistant     ERYTHROMYCIN >=8 RESISTANT Resistant     GENTAMICIN <=0.5 SENSITIVE Sensitive     OXACILLIN >=4 RESISTANT Resistant     TETRACYCLINE <=1 SENSITIVE Sensitive     VANCOMYCIN <=0.5 SENSITIVE Sensitive     TRIMETH/SULFA <=10 SENSITIVE Sensitive     CLINDAMYCIN RESISTANT Resistant     RIFAMPIN <=0.5 SENSITIVE Sensitive     Inducible Clindamycin POSITIVE Resistant     * METHICILLIN RESISTANT STAPHYLOCOCCUS AUREUS  Blood Culture ID Panel (Reflexed)     Status: Abnormal   Collection Time: 02/13/19  2:18 PM  Result Value Ref Range Status   Enterococcus species NOT DETECTED NOT DETECTED Final   Listeria monocytogenes NOT DETECTED NOT DETECTED Final   Staphylococcus species DETECTED (A) NOT DETECTED Final    Comment: CRITICAL RESULT CALLED TO, READ BACK BY AND VERIFIED WITH: ABBEY ELLINGTON AT 9476 ON 02/14/2019 [place].    Staphylococcus aureus (BCID) DETECTED (A) NOT DETECTED Final    Comment: Methicillin (oxacillin)-resistant Staphylococcus aureus (MRSA). MRSA is predictably resistant to beta-lactam antibiotics (except ceftaroline). Preferred therapy is vancomycin unless clinically contraindicated. Patient requires contact precautions if  hospitalized. CRITICAL RESULT CALLED TO, READ BACK BY AND VERIFIED WITH: Herbert Pun AT [phone] ON 02/14/2019 [place].    Methicillin resistance DETECTED (A) NOT DETECTED Final    Comment: CRITICAL RESULT CALLED TO, READ BACK BY AND VERIFIED WITH: Herbert Pun AT  6803 ON 02/14/2019 [place].    Streptococcus species NOT DETECTED NOT DETECTED Final   Streptococcus agalactiae NOT DETECTED NOT DETECTED Final   Streptococcus pneumoniae NOT DETECTED NOT DETECTED Final   Streptococcus  pyogenes NOT DETECTED NOT DETECTED Final   Acinetobacter baumannii NOT DETECTED NOT DETECTED Final   Enterobacteriaceae species NOT DETECTED NOT DETECTED Final   Enterobacter cloacae complex NOT DETECTED NOT DETECTED Final   Escherichia coli NOT DETECTED NOT DETECTED Final   Klebsiella oxytoca NOT DETECTED NOT DETECTED Final   Klebsiella pneumoniae NOT DETECTED NOT DETECTED Final   Proteus species NOT DETECTED NOT DETECTED Final   Serratia marcescens NOT DETECTED NOT DETECTED Final   Haemophilus influenzae NOT DETECTED NOT DETECTED Final   Neisseria meningitidis NOT DETECTED NOT DETECTED Final   Pseudomonas aeruginosa NOT DETECTED NOT DETECTED Final   Candida albicans NOT DETECTED NOT DETECTED Final   Candida glabrata NOT DETECTED NOT DETECTED Final   Candida krusei NOT DETECTED NOT DETECTED Final   Candida parapsilosis NOT DETECTED NOT DETECTED Final   Candida tropicalis NOT DETECTED NOT DETECTED Final    Comment: Performed at [facility], [address] [place], [place] [address]  CULTURE, BLOOD (ROUTINE X 2) w Reflex to ID Panel     Status: None   Collection Time: 02/13/19  3:09 PM   Specimen: BLOOD  Result Value Ref Range Status   Specimen Description BLOOD BLOOD LEFT HAND  Final   Special Requests   Final    BOTTLES DRAWN AEROBIC AND ANAEROBIC Blood Culture adequate volume   Culture   Final    NO GROWTH 5 DAYS Performed at [facility], [address], [place], [place] [address]    Report Status 02/18/2019 FINAL  Final  Culture, blood (routine x 2)     Status: Abnormal   Collection Time: 02/15/19 12:49 PM   Specimen: BLOOD  Result Value Ref Range Status   Specimen Description   Final    BLOOD LT HAND Performed at [facility], [address] [place], [place] [address]    Special Requests   Final    BOTTLES DRAWN AEROBIC AND ANAEROBIC Blood Culture results may not be optimal due to an inadequate volume of blood received in culture  bottles Performed at [facility], [address] [place], [place] [address]    Culture  Setup Time   Final    GRAM POSITIVE COCCI IN BOTH AEROBIC AND ANAEROBIC BOTTLES CRITICAL RESULT CALLED TO, READ BACK BY AND VERIFIED WITHEleonore Chiquito AT 9450 02/16/2019 SDR Performed at [facility], [address] [place], [place] [address]    Culture (A)  Final    METHICILLIN RESISTANT STAPHYLOCOCCUS AUREUS STAPHYLOCOCCUS SPECIES (COAGULASE NEGATIVE) THE SIGNIFICANCE OF ISOLATING THIS ORGANISM FROM A SINGLE SET OF BLOOD CULTURES WHEN MULTIPLE SETS ARE DRAWN IS UNCERTAIN. PLEASE NOTIFY THE MICROBIOLOGY DEPARTMENT WITHIN ONE WEEK IF SPECIATION AND SENSITIVITIES ARE REQUIRED. Performed at [facility], [place] [address], [place], [place] [address]    Report Status 02/18/2019 FINAL  Final   Organism ID, Bacteria METHICILLIN RESISTANT STAPHYLOCOCCUS AUREUS  Final      Susceptibility   Methicillin resistant staphylococcus aureus - MIC*    CIPROFLOXACIN >=8 RESISTANT Resistant     ERYTHROMYCIN >=8 RESISTANT Resistant     GENTAMICIN <=0.5 SENSITIVE Sensitive     OXACILLIN >=4 RESISTANT Resistant     TETRACYCLINE <=1 SENSITIVE Sensitive     VANCOMYCIN <=0.5 SENSITIVE Sensitive  TRIMETH/SULFA <=10 SENSITIVE Sensitive     CLINDAMYCIN RESISTANT Resistant     RIFAMPIN <=0.5 SENSITIVE Sensitive     Inducible Clindamycin POSITIVE Resistant     * METHICILLIN RESISTANT STAPHYLOCOCCUS AUREUS  Aerobic Culture (superficial specimen)     Status: None   Collection Time: 02/17/19  1:31 PM   Specimen: Heel; Wound  Result Value Ref Range Status   Specimen Description   Final    HEEL Performed at [facility], [address] [place], [place] [address]    Special Requests   Final    NONE Performed at [facility], [address], [place], [place] [address]    Gram Stain   Final    NO WBC SEEN MODERATE GRAM POSITIVE COCCI IN PAIRS    Culture   Final     ABUNDANT METHICILLIN RESISTANT STAPHYLOCOCCUS AUREUS ABUNDANT DIPHTHEROIDS(CORYNEBACTERIUM SPECIES) Standardized susceptibility testing for this organism is not available. Performed at [facility], [place] [address], [place], [place] [address]    Report Status 02/19/2019 FINAL  Final   Organism ID, Bacteria METHICILLIN RESISTANT STAPHYLOCOCCUS AUREUS  Final      Susceptibility   Methicillin resistant staphylococcus aureus - MIC*    CIPROFLOXACIN >=8 RESISTANT Resistant     ERYTHROMYCIN >=8 RESISTANT Resistant     GENTAMICIN <=0.5 SENSITIVE Sensitive     OXACILLIN >=4 RESISTANT Resistant     TETRACYCLINE <=1 SENSITIVE Sensitive     VANCOMYCIN 1 SENSITIVE Sensitive     TRIMETH/SULFA <=10 SENSITIVE Sensitive     CLINDAMYCIN RESISTANT Resistant     RIFAMPIN <=0.5 SENSITIVE Sensitive     Inducible Clindamycin POSITIVE Resistant     * ABUNDANT METHICILLIN RESISTANT STAPHYLOCOCCUS AUREUS  CULTURE, BLOOD (ROUTINE X 2) w Reflex to ID Panel     Status: None   Collection Time: 02/18/19 11:29 PM   Specimen: BLOOD  Result Value Ref Range Status   Specimen Description BLOOD LEFT ARM  Final   Special Requests   Final    BOTTLES DRAWN AEROBIC AND ANAEROBIC Blood Culture adequate volume   Culture   Final    NO GROWTH 5 DAYS Performed at [facility], [address] [place], [place] [address]    Report Status 02/23/2019 FINAL  Final  CULTURE, BLOOD (ROUTINE X 2) w Reflex to ID Panel     Status: None   Collection Time: 02/18/19 11:29 PM   Specimen: BLOOD  Result Value Ref Range Status   Specimen Description BLOOD LEFT ARM  Final   Special Requests   Final    BOTTLES DRAWN AEROBIC AND ANAEROBIC Blood Culture adequate volume   Culture   Final    NO GROWTH 5 DAYS Performed at [facility], [address], [place], [place] [address]    Report Status 02/23/2019 FINAL  Final    Coagulation Studies: No results for input(s): LABPROT, INR in the last 72  hours.  Urinalysis: No results for input(s): COLORURINE, LABSPEC, PHURINE, GLUCOSEU, HGBUR, BILIRUBINUR, KETONESUR, PROTEINUR, UROBILINOGEN, NITRITE, LEUKOCYTESUR in the last 72 hours.  Invalid input(s): APPERANCEUR    Imaging: X-ray chest PA or AP  Result Date: 03/03/2019 CLINICAL DATA:  ETT placement EXAM: CHEST  1 VIEW COMPARISON:  03/01/2019 FINDINGS: Endotracheal tube with the tip 4 cm above the carina. Dual lumen left-sided central venous catheter with the tip projecting over the SVC. Bilateral mild interstitial thickening. No pleural effusion or pneumothorax. Linear band of airspace disease in the right lung likely  reflecting atelectasis. Stable cardiomegaly.  Prior CABG.  No acute osseous abnormality. IMPRESSION: 1. Cardiomegaly with pulmonary vascular congestion. 2. Linear band of airspace disease in the right lung likely reflecting atelectasis. Electronically Signed   By: Kathreen Devoid   On: 03/03/2019 17:53   DG Chest Port 1 View  Result Date: 03/04/2019 CLINICAL DATA:  Acute respiratory failure. EXAM: PORTABLE CHEST 1 VIEW COMPARISON:  March 03, 2019. FINDINGS: Stable cardiomediastinal silhouette. Endotracheal tube is unchanged in position. Left internal jugular catheter is unchanged in position. No pneumothorax or significant pleural effusion is noted. Status post coronary bypass graft. Mild bibasilar subsegmental atelectasis is noted. Bony thorax is unremarkable. IMPRESSION: Stable support apparatus. Stable mild bibasilar subsegmental atelectasis. Electronically Signed   By: Marijo Conception M.D.   On: 03/04/2019 07:54     Medications:   . sodium chloride Stopped (03/01/19 0429)  . sodium chloride    .  ceFAZolin (ANCEF) IV    . dexmedetomidine (PRECEDEX) IV infusion 1.2 mcg/kg/hr (03/04/19 0828)  . dextrose 50 mL/hr at 03/04/19 0828  . norepinephrine (LEVOPHED) Adult infusion Stopped (03/04/19 0220)  . vancomycin Stopped (03/02/19 1752)   . aspirin  81 mg Oral Daily  .  B-complex with vitamin C  1 tablet Per Tube Daily  . chlorhexidine gluconate (MEDLINE KIT)  15 mL Mouth Rinse BID  . Chlorhexidine Gluconate Cloth  6 each Topical Q0600  . epoetin (EPOGEN/PROCRIT) injection  4,000 Units Intravenous Q M,W,F-HD  . fluticasone  2 spray Each Nare Daily  . free water  30 mL Per Tube Q4H  . insulin aspart  0-15 Units Subcutaneous Q4H  . levothyroxine  88 mcg Oral QAC breakfast  . mouth rinse  15 mL Mouth Rinse q12n4p  . mouth rinse  15 mL Mouth Rinse 10 times per day  . midodrine  10 mg Oral TID WC  . pantoprazole (PROTONIX) IV  40 mg Intravenous Q24H  . venlafaxine  75 mg Oral BID WC  . zinc sulfate  220 mg Oral Daily   sodium chloride, sodium chloride, fentaNYL (SUBLIMAZE) injection, fentaNYL (SUBLIMAZE) injection, haloperidol lactate, ipratropium-albuterol, ondansetron (ZOFRAN) IV  Assessment/ Plan:  Jamie Arias is a 63 y.o. white female with end-stage renal disease on hemodialysis, atrial fibrillation, chronic diastolic congestive heart failure, coronary artery disease status post CABG, diabetes mellitus type II, hypertension, hyperlipidemia, hypothyroidism, Covid positive in July 2020, recent nursing home stay in [place] Prolonged hospitalization for MRSA bacteremia/sepsis and now with acute encephalopathy  [facility] MWF [facility] [address] Left AVG 81.5kg  1. End Stage Renal Disease: on hemodialysis. With complication of dialysis device.  There is a wound on the right arm close to AV graft.  Angiogram on 12/21 shows no communication of the wound with AV graft.  Avoid cannulation of the graft until wound has healed.  In the meantime, patient is getting dialysis via left chest PermCath -Removal of AV today -  MWF schedule.  -We will plan for hemodialysis tomorrow due to surgical procedure today  2. Hypotension - midodrine 10 mg TID,   3. Anemia of chronic kidney disease:   Status post PRBC transfusion on 12/22 - EPO with HD treatments.   4.  Secondary Hyperparathyroidism:  not currently on binders.  Lab Results  Component Value Date   PTH 135 (H) 02/22/2017   CALCIUM 8.5 (L) 03/04/2019   CAION 1.20 07/03/2013   PHOS 3.7 03/04/2019    5. Acute Encephalopathy:  -Remains encephalopathic  6. Sepsis  from MRSA - Abx as per ID team - Source of MRSA has not been identified -AV graft to be removed today  7. A Fib - requiring Anticoagulation with heparin Drip, Eliquis on Hold - heparin on hold  8. DM with CKD Insulin dependent Lab Results  Component Value Date   HGBA1C 5.3 02/09/2019   9. Left arm DVT Dx 12/9  10.  Acute respiratory failure Patient was not extubated after PEG procedure on 12/29      LOS: 23 Jamie Arias 12/30/20209:36 AM

## 2019-03-04 NOTE — Anesthesia Postprocedure Evaluation (Signed)
Anesthesia Post Note  Patient: Jamie Arias  Procedure(s) Performed: PERCUTANEOUS ENDOSCOPIC GASTROSTOMY (PEG) PLACEMENT (N/A )  Patient location during evaluation: ICU Anesthesia Type: General Level of consciousness: sedated and patient remains intubated per anesthesia plan Pain management: pain level controlled Vital Signs Assessment: post-procedure vital signs reviewed and stable Respiratory status: patient on ventilator - see flowsheet for VS Cardiovascular status: stable Anesthetic complications: no     Last Vitals:  Vitals:   03/04/19 0400 03/04/19 0500  BP: (!) 139/59 (!) 128/49  Pulse: 71 (!) 55  Resp: 14 15  Temp:    SpO2: 100% 100%    Last Pain:  Vitals:   03/04/19 0000  TempSrc: Axillary  PainSc:                  Ricki Miller

## 2019-03-04 NOTE — Anesthesia Postprocedure Evaluation (Signed)
Anesthesia Post Note  Patient: Jamie Arias  Procedure(s) Performed: REMOVAL OF ARTERIOVENOUS GORETEX GRAFT ([place]) (Right Arm Lower)  Patient location during evaluation: PACU Anesthesia Type: General Level of consciousness: awake and alert Pain management: pain level controlled Vital Signs Assessment: post-procedure vital signs reviewed and stable Respiratory status: spontaneous breathing, nonlabored ventilation, respiratory function stable and patient connected to nasal cannula oxygen Cardiovascular status: blood pressure returned to baseline and stable Postop Assessment: no apparent nausea or vomiting Anesthetic complications: no     Last Vitals:  Vitals:   03/04/19 1500 03/04/19 1600  BP: (!) 122/54 (!) 134/54  Pulse: (!) 53 (!) 54  Resp: 14 18  Temp:  36.6 C  SpO2: 96% 99%    Last Pain:  Vitals:   03/04/19 1600  TempSrc: Axillary  PainSc:                  Ashle Stief S

## 2019-03-04 NOTE — Progress Notes (Signed)
Pre HD Tx Note Pt is intubated. Currently on 5% dextrose continuous drip, Levophed 25mcg/min , and Dexmedetomidine 0.65mcg/kg/hr. Tx is planned for 2h36min as per Dr Candiss Norse verbal order. 3K2.5Ca. for 1L removal. CVC patent BP slightly  03/04/19 2030  Hand-Off documentation  Report given to (Full Name) Newt Minion RN  Report received from (Full Name) Manuella Ghazi RN  Vital Signs  Temp 98.7 F (37.1 C)  Temp Source Axillary  Pulse Rate 63  Pulse Rate Source Monitor  Resp 17  BP 117/60  BP Location Left Arm  BP Method Automatic  Patient Position (if appropriate) Lying  Oxygen Therapy  SpO2 100 %  O2 Device Ventilator  FiO2 (%) 30 %  End Tidal CO2 (EtCO2) 35  Pain Assessment  Pain Scale Faces  Pain Score 0  Time-Out for Hemodialysis  What Procedure? HD  Pt Identifiers(min of two) MRN/Account#;First/Last Name  Correct Site? Yes  Correct Side? Yes  Correct Procedure? Yes  Consents Verified? Yes  Safety Precautions Reviewed? Yes  Engineer, civil (consulting) Number 6  Station Number  ([id number])  UF/Alarm Test Passed  Conductivity: Meter 14.1  Conductivity: Machine  13.6  pH 7.6  Reverse Osmosis  (RO3)  Normal Saline Lot Number [id number]  Dialyzer Lot Number [id number]  Disposable Set Lot Number [id number]  Machine Temperature 97.7 F (36.5 C)  Musician and Audible Yes  Blood Lines Intact and Secured Yes  Pre Treatment Patient Checks  Vascular access used during treatment Catheter  HD catheter dressing before treatment WDL  Patient is receiving dialysis in a chair  (in bed)  Hepatitis B Surface Antigen Results Negative  Date Hepatitis B Surface Antigen Drawn 02/10/19  Hepatitis B Surface Antibody  (<10)  Date Hepatitis B Surface Antibody Drawn 02/10/19  Hemodialysis Consent Verified Yes  Hemodialysis Standing Orders Initiated Yes  ECG (Telemetry) Monitor On Yes  Prime Ordered Normal Saline  Length of  DialysisTreatment -hour(s) 2.5 Hour(s) (AS PER DR [facility] VERBAL ORDER)   Dialysis Treatment Comments  (NA140)  Dialyzer Elisio 17H NR  Dialysate 3K;2.5 Ca  Dialysis Anticoagulant None  Dialysate Flow Ordered 600  Blood Flow Rate Ordered 400 mL/min  Ultrafiltration Goal 1 Liters  Dialysis Blood Pressure Support Ordered Albumin  Hemodialysis Catheter Left Internal jugular Double lumen Permanent (Tunneled)  Placement Date/Time: 02/23/19 1514   Time Out: Correct patient;Correct site;Correct procedure  Maximum sterile barrier precautions: Hand hygiene;Cap;Mask;Sterile gown;Sterile gloves;Large sterile sheet  Site Prep: Chlorhexidine (preferred)  Local Anes...  Site Condition No complications  Blue Lumen Status Flushed;Blood return noted  Red Lumen Status Flushed;Blood return noted  Purple Lumen Status N/A  Catheter fill solution Heparin 1000 units/ml  Catheter fill volume (Arterial) 1.5 cc  Catheter fill volume (Venous) 1.5  Dressing Type Biopatch  Dressing Status Clean;Dry;Intact  Interventions New dressing  Drainage Description None  Dressing Change Due 03/09/19   low but WDL to start Tx.

## 2019-03-04 NOTE — Progress Notes (Signed)
Pt with known DVT left arm.  Pt has been off heparin gtt for PEG placement yesterday, with plan to have AV Graft removed today.  Discussed with Dr. Lucky Cowboy of Vascular Surgery this morning, and he is ok with starting heparin gtt back, but wants heparin drip discontinued 4 hrs prior to surgery.  Pt is scheduled for surgery later today around approximately 11:00 - 12:00.  Have called and discussed with pharmacy, and with nursing at bedside.  Will resume heparin gtt for now, and will d/c @ 0700 this morning.    Darel Hong, AGACNP-BC [facility] Pager: [phone]

## 2019-03-04 NOTE — Progress Notes (Signed)
HD Tx started    03/04/19 2045  Vital Signs  Pulse Rate 64  Resp 17  BP (!) 92/57  Oxygen Therapy  SpO2 100 %  O2 Device Ventilator  FiO2 (%) 30 %  End Tidal CO2 (EtCO2) 36  During Hemodialysis Assessment  Blood Flow Rate (mL/min) 250 mL/min  Arterial Pressure (mmHg) -90 mmHg  Venous Pressure (mmHg) 90 mmHg  Transmembrane Pressure (mmHg) 70 mmHg  Ultrafiltration Rate (mL/min) 400 mL/min  Dialysate Flow Rate (mL/min) 600 ml/min  Conductivity: Machine  14  HD Safety Checks Performed Yes  Dialysis Fluid Bolus Normal Saline  Bolus Amount (mL) 250 mL  Intra-Hemodialysis Comments Tx initiated

## 2019-03-04 NOTE — Progress Notes (Signed)
Post HD tx Assessment   03/04/19 2302  Neurological  Level of Consciousness Responds to Pain  Orientation Level Intubated/Tracheostomy - Unable to assess  Respiratory  Respiratory Pattern Regular;Unlabored  Chest Assessment Chest expansion symmetrical  Bilateral Breath Sounds Diminished;Rhonchi  Cardiac  Pulse Regular  Heart Sounds S1, S2  Jugular Venous Distention (JVD) No  Cardiac Rhythm SB  Antiarrhythmic device No  Vascular  R Radial Pulse +1  L Radial Pulse +1  R Dorsalis Pedis Pulse +1  L Dorsalis Pedis Pulse +1  Edema Generalized;Right upper extremity;Left upper extremity;Right lower extremity;Left lower extremity  Generalized Edema +2  RUE Edema +2  LUE Edema +2  RLE Edema +2  LLE Edema +2  Integumentary  Integumentary (WDL) X  Skin Color Appropriate for ethnicity  Skin Condition Dry  Skin Integrity Abrasion;Ecchymosis;MASD;Skin tear;Weeping  Ecchymosis Location Other (Comment) (Scattered)  Ecchymosis Location Orientation Other (Comment) (Scattered)  Weeping Location Arm  Weeping Location Orientation Bilateral  Musculoskeletal  Musculoskeletal (WDL) X  Generalized Weakness Yes  Gastrointestinal  Bowel Sounds Assessment Active  Last BM Date 03/04/19  GU Assessment  Genitourinary (WDL) X  Genitourinary Symptoms Anuria (ESRD Pt)  Psychosocial  Psychosocial (WDL) X  Patient Behaviors Not interactive  Needs Expressed Other (Comment) (Needs anticipated)  Psychosocial Additional Assessments Family behavior  Family Behavior Supportive;Calm  Emotional support given Given to patient

## 2019-03-04 NOTE — Progress Notes (Signed)
Pt off unit at this time to OR.

## 2019-03-04 NOTE — Progress Notes (Signed)
Pt returned to unit from OR at this time. VSS.

## 2019-03-04 NOTE — Consult Note (Signed)
ANTICOAGULATION CONSULT NOTE  Pharmacy Consult for heparin Indication: DVT  Patient Measurements: Height: 5' 4" (162.6 cm) Weight: 166 lb 7.2 oz (75.5 kg) IBW/kg (Calculated) : 54.7 Heparin Dosing Weight: 71 kg  Vital Signs: Temp: 98.3 F (36.8 C) (12/30 1300) Temp Source: Axillary (12/30 1300) BP: 108/55 (12/30 1300) Pulse Rate: 49 (12/30 1300)  Labs: Recent Labs    03/02/19 0329 03/02/19 0330 03/03/19 0636 03/03/19 2100 03/03/19 2139 03/04/19 0417  HGB 8.2*  --  8.9*  --  8.6* 8.1*  HCT 24.1*  --  26.3*  --  26.6* 24.4*  PLT 197  --  172  --  219 196  HEPARINUNFRC 0.49  --  0.22*  --   --   --   CREATININE 3.61*  --  2.44* 2.95*  --  3.14*  CKTOTAL  --  31*  --   --   --   --    Estimated Creatinine Clearance: 17.8 mL/min (A) (by C-G formula based on SCr of 3.14 mg/dL (H)).  Medical History: Past Medical History:  Diagnosis Date  . Anemia   . Chronic diastolic CHF (congestive heart failure) ([place])    a. Due to ischemic cardiomyopathy. EF as low as 35%, improved to normal s/p CABG; b. echo 07/06/13: EF 55-60%, no RWMAs, mod TR, trivial pericardial effusion not c/w tamponade physiology;  c. 10/2015 Echo: EF 65%, Gr1 DD, triv AI, mild MR, mildly dil LA, mod TR, PASP 61mHg.  .Marland KitchenCoronary artery disease    a. NSTEMI 06/2013; b.cath: severe three-vessel CAD w/ EF 30% & mild-mod MR; c. s/p 3 vessel CABG 07/02/13 (LIMA-LAD, SVG-OM, and SVG-RPDA);  d. 10/2015 MV: no ischemia/infarct.  . Diabetes mellitus without complication ([place]   . Diabetic neuropathy ([place]   . D[facility] patient ([facility]    MWF  . ESRD (end stage renal disease) ([place]    a. 12/2015 initiated - mwf dialysis.  .Marland KitchenGERD (gastroesophageal reflux disease)   . Hyperlipidemia   . Hypertension   . Hypothyroidism   . Myocardial infarction ([place] 2015  . Neuropathy   . Pleural effusion 2015  . Pulmonary hypertension ([place]   . Renal insufficiency   . Wears dentures    full lower    Medications:  Scheduled:  .  aspirin  81 mg Oral Daily  . B-complex with vitamin C  1 tablet Per Tube Daily  . chlorhexidine gluconate (MEDLINE KIT)  15 mL Mouth Rinse BID  . Chlorhexidine Gluconate Cloth  6 each Topical Q0600  . epoetin (EPOGEN/PROCRIT) injection  4,000 Units Intravenous Q M,W,F-HD  . fluticasone  2 spray Each Nare Daily  . free water  30 mL Per Tube Q4H  . insulin aspart  0-15 Units Subcutaneous Q4H  . levothyroxine  88 mcg Oral QAC breakfast  . mouth rinse  15 mL Mouth Rinse q12n4p  . mouth rinse  15 mL Mouth Rinse 10 times per day  . midodrine  10 mg Oral TID WC  . pantoprazole (PROTONIX) IV  40 mg Intravenous Q24H  . venlafaxine  75 mg Oral BID WC  . zinc sulfate  220 mg Oral Daily    Assessment: 63y.o. female admitted on 02/09/2019 with MRSA  bacteremia. She was on apixaban PTA but last dose was prior to admission (12/7). [place]15 revealed age-indeterminate short segment occlusive DVT involving the mid humeral aspect of one of the paired brachial veins.  Heparin was started but after repeated attempts by multiple phlebotomists lab was  unable to draw labs required for heparin monitoring. Additionally, she was too confused and disoriented to swallow oral medications. On 12/20 a femoral central line was placed. Heparin was stopped this morning due to a fall and is being restarted upon returning from HD  Heparin Course: 12/24 0231 HL 0.26: held due to unwitnessed fall and bleeding from arm 12/25 0413 HL 0.28: subtherapeutic - CBC stable.  Will increase Heparin to 900 units/hr and recheck in 8 hours. 12/25 1356 HL 0.47: therapeutic  12/25 2020 HL 0.36: therapeutic x 2 12/26 HL _0 = 0.16. Will give Heparin bolus of 2100 units and increase heparin drip to 1100 units/hr  Goal of Therapy:  Heparin Level: 0.3 - 0.7 units/mL Monitor platelets by anticoagulation protocol: Yes   Plan:  Spoke with MD Dew.  Plan to restart heparin tomorrow morning following procedure to remove right arm AV graft today.      Gerald Dexter, PharmD Pharmacy Resident  03/04/2019 3:33 PM

## 2019-03-04 NOTE — Progress Notes (Signed)
Jamie Antigua, MD [address] [place], [place], [place], [address] [address], [place], [place], [place], [address] Phone: [phone]  Fax: [phone]   Subjective: Patient underwent PEG tube placement yesterday.  Remains intubated post procedure   Objective: Exam: Vital signs in last 24 hours: Vitals:   03/04/19 0800 03/04/19 0801 03/04/19 0900 03/04/19 1000  BP: (!) 107/38  (!) 127/50 (!) 121/58  Pulse: (!) 55  62 (!) 59  Resp: _0 Temp: 98 F (36.7 C)     TempSrc: Axillary     SpO2: 100% 100% 100% 100%  Weight:      Height:       Weight change:   Intake/Output Summary (Last 24 hours) at 03/04/2019 1044 Last data filed at 03/04/2019 7902 Gross per 24 hour  Intake 717.2 ml  Output 400 ml  Net 317.2 ml    General: Intubated, sedated Abd: Soft, NT/ND, No HSM Skin: Warm, no rashes Neck: Supple, Trachea midline   Lab Results: Lab Results  Component Value Date   WBC 6.7 03/04/2019   HGB 8.1 (L) 03/04/2019   HCT 24.4 (L) 03/04/2019   MCV 89.7 03/04/2019   PLT 196 03/04/2019   Micro Results: No results found for this or any previous visit (from the past 240 hour(s)). Studies/Results: X-ray chest PA or AP  Result Date: 03/03/2019 CLINICAL DATA:  ETT placement EXAM: CHEST  1 VIEW COMPARISON:  03/01/2019 FINDINGS: Endotracheal tube with the tip 4 cm above the carina. Dual lumen left-sided central venous catheter with the tip projecting over the SVC. Bilateral mild interstitial thickening. No pleural effusion or pneumothorax. Linear band of airspace disease in the right lung likely reflecting atelectasis. Stable cardiomegaly.  Prior CABG.  No acute osseous abnormality. IMPRESSION: 1. Cardiomegaly with pulmonary vascular congestion. 2. Linear band of airspace disease in the right lung likely reflecting atelectasis. Electronically Signed   By: Kathreen Devoid   On: 03/03/2019 17:53   DG Chest Port 1 View  Result Date: 03/04/2019 CLINICAL DATA:   Acute respiratory failure. EXAM: PORTABLE CHEST 1 VIEW COMPARISON:  March 03, 2019. FINDINGS: Stable cardiomediastinal silhouette. Endotracheal tube is unchanged in position. Left internal jugular catheter is unchanged in position. No pneumothorax or significant pleural effusion is noted. Status post coronary bypass graft. Mild bibasilar subsegmental atelectasis is noted. Bony thorax is unremarkable. IMPRESSION: Stable support apparatus. Stable mild bibasilar subsegmental atelectasis. Electronically Signed   By: Marijo Conception M.D.   On: 03/04/2019 07:54   Medications:  Scheduled Meds: . [MAR Hold] aspirin  81 mg Oral Daily  . [MAR Hold] B-complex with vitamin C  1 tablet Per Tube Daily  . [MAR Hold] chlorhexidine gluconate (MEDLINE KIT)  15 mL Mouth Rinse BID  . [MAR Hold] Chlorhexidine Gluconate Cloth  6 each Topical Q0600  . [MAR Hold] epoetin (EPOGEN/PROCRIT) injection  4,000 Units Intravenous Q M,W,F-HD  . [MAR Hold] fluticasone  2 spray Each Nare Daily  . [MAR Hold] free water  30 mL Per Tube Q4H  . [MAR Hold] insulin aspart  0-15 Units Subcutaneous Q4H  . [MAR Hold] levothyroxine  88 mcg Oral QAC breakfast  . [MAR Hold] mouth rinse  15 mL Mouth Rinse q12n4p  . [MAR Hold] mouth rinse  15 mL Mouth Rinse 10 times per day  . [MAR Hold] midodrine  10 mg Oral TID WC  . [MAR Hold] pantoprazole (PROTONIX) IV  40 mg Intravenous Q24H  . [MAR Hold] venlafaxine  75 mg Oral BID WC  . [MAR Hold] zinc sulfate  220 mg Oral Daily   Continuous Infusions: . [MAR Hold] sodium chloride Stopped (03/01/19 0429)  . [MAR Hold] sodium chloride    . [MAR Hold] dexmedetomidine (PRECEDEX) IV infusion 1.2 mcg/kg/hr (03/04/19 0828)  . dextrose 50 mL/hr at 03/04/19 0828  . [MAR Hold] norepinephrine (LEVOPHED) Adult infusion Stopped (03/04/19 0220)  . [MAR Hold] vancomycin Stopped (03/02/19 1752)   PRN Meds:.[MAR Hold] sodium chloride, [MAR Hold] sodium chloride, [MAR Hold] fentaNYL (SUBLIMAZE) injection, [MAR  Hold] fentaNYL (SUBLIMAZE) injection, [MAR Hold] haloperidol lactate, [MAR Hold] ipratropium-albuterol, [MAR Hold] ondansetron (ZOFRAN) IV   Assessment: Principal Problem:   Sepsis due to methicillin resistant Staphylococcus aureus (MRSA) (HCC) Active Problems:   Type 2 diabetes mellitus with ESRD (end-stage renal disease) (HCC)   Atrial fibrillation, chronic (HCC)   Hypotension   Chronic diastolic CHF (congestive heart failure) (HCC)   ESRD (end stage renal disease) (HCC)   Altered level of consciousness   Decubitus ulcer of heel, bilateral, stage 2 (HCC)   MRSA bacteremia   Arm DVT (deep venous thromboembolism), acute, left (HCC)   Acute encephalopathy   Acute metabolic encephalopathy   Acute blood loss anemia   AF (paroxysmal atrial fibrillation) (HCC)   Diarrhea   Oropharyngeal dysphagia    Plan: Patient was left intubated post procedure by anesthesia staff due to weakness  Extubation timeline as per ICU team  PEG tube examined and no bleeding or breakdown noted on the site. Gauze from underneath bumper removed and nurse in room advised to clean it with soap and water once daily as written in nursing order  External bumper lightly touching skin and at 4.5 cm mark  Ok to use for feeds,   GI service will sign off   LOS: 23 days   Jamie Antigua, MD 03/04/2019, 10:44 AM

## 2019-03-04 NOTE — Anesthesia Procedure Notes (Signed)
Procedure Name: Intubation Date/Time: 03/04/2019 10:44 AM Performed by: Nelda Marseille, CRNA Pre-anesthesia Checklist: Patient identified, Patient being monitored, Timeout performed, Emergency Drugs available and Suction available Patient Re-evaluated:Patient Re-evaluated prior to induction Oxygen Delivery Method: Circle system utilized Preoxygenation: Pre-oxygenation with 100% oxygen Induction Type: Inhalational induction Ventilation: Mask ventilation without difficulty Grade View: Grade II Tube type: Oral Tube size: 7.0 mm Number of attempts: 1 Airway Equipment and Method: Stylet Placement Confirmation: positive ETCO2,  breath sounds checked- equal and bilateral and CO2 detector Secured at: 22 cm Tube secured with: Tape Dental Injury: Teeth and Oropharynx as per pre-operative assessment  Difficulty Due To: Difficulty was unanticipated Comments: Patient came from ICU 1 already intubated

## 2019-03-04 NOTE — H&P (Signed)
[facility] History & Physical Update  The patient was interviewed and re-examined.  The patient's previous History and Physical has been reviewed and is unchanged.  There is no change in the plan of care. We plan to proceed with the scheduled procedure.  Leotis Pain, MD  03/04/2019, 8:49 AM

## 2019-03-04 NOTE — Progress Notes (Signed)
Post HD Tx Note   03/04/19 2315  Hand-Off documentation  Report given to (Full Name) Manuella Ghazi RN  Report received from (Full Name) Newt Minion RN  Vital Signs  Temp 98.7 F (37.1 C)  Temp Source Axillary  Pulse Rate 92  Resp 17  BP (!) 118/56  BP Location Left Arm  BP Method Automatic  Patient Position (if appropriate) Lying  Oxygen Therapy  SpO2 96 %  O2 Device Ventilator  FiO2 (%) 30 %  End Tidal CO2 (EtCO2) 33  Post-Hemodialysis Assessment  Rinseback Volume (mL) 250 mL  KECN 57.8 V  Dialyzer Clearance Lightly streaked  Duration of HD Treatment -hour(s) 2.5 hour(s)  Hemodialysis Intake (mL) 500 mL  UF Total -Machine (mL) 1000 mL  Net UF (mL) 500 mL  Tolerated HD Treatment Yes  Hemodialysis Catheter Left Internal jugular Double lumen Permanent (Tunneled)  Placement Date/Time: 02/23/19 1514   Time Out: Correct patient;Correct site;Correct procedure  Maximum sterile barrier precautions: Hand hygiene;Cap;Mask;Sterile gown;Sterile gloves;Large sterile sheet  Site Prep: Chlorhexidine (preferred)  Local Anes...  Site Condition No complications  Blue Lumen Status Heparin locked  Red Lumen Status Heparin locked  Purple Lumen Status N/A  Catheter fill solution Heparin 1000 units/ml  Catheter fill volume (Arterial) 1.5 cc  Catheter fill volume (Venous) 1.5  Dressing Type Biopatch  Dressing Status Clean;Dry;Intact;Dressing changed  Interventions New dressing  Drainage Description None  Dressing Change Due 03/09/19  Post treatment catheter status Capped and Clamped  Pt Tolerated well HD Tx. UF goal prescribed was met. 10109m removed

## 2019-03-04 NOTE — Consult Note (Signed)
ANTICOAGULATION CONSULT NOTE  Pharmacy Consult for heparin Indication: DVT  Patient Measurements: Height: '5\' 4"'$  (162.6 cm) Weight: 166 lb 7.2 oz (75.5 kg) IBW/kg (Calculated) : 54.7 Heparin Dosing Weight: 71 kg  Vital Signs: Temp: 97.7 F (36.5 C) (12/30 0000) Temp Source: Axillary (12/30 0000) BP: 117/47 (12/30 0300) Pulse Rate: 56 (12/30 0300)  Labs: Recent Labs    03/01/19 0429 03/02/19 0329 03/02/19 0330 03/03/19 0636 03/03/19 2100 03/03/19 2139  HGB 8.6* 8.2*  --  8.9*  --  8.6*  HCT 25.0* 24.1*  --  26.3*  --  26.6*  PLT 184 197  --  172  --  219  HEPARINUNFRC 0.50 0.49  --  0.22*  --   --   CREATININE 3.03* 3.61*  --  2.44* 2.95*  --   CKTOTAL  --   --  31*  --   --   --    Estimated Creatinine Clearance: 18.9 mL/min (A) (by C-G formula based on SCr of 2.95 mg/dL (H)).  Medical History: Past Medical History:  Diagnosis Date  . Anemia   . Chronic diastolic CHF (congestive heart failure) ([place])    a. Due to ischemic cardiomyopathy. EF as low as 35%, improved to normal s/p CABG; b. echo 07/06/13: EF 55-60%, no RWMAs, mod TR, trivial pericardial effusion not c/w tamponade physiology;  c. 10/2015 Echo: EF 65%, Gr1 DD, triv AI, mild MR, mildly dil LA, mod TR, PASP 76mHg.  .Marland KitchenCoronary artery disease    a. NSTEMI 06/2013; b.cath: severe three-vessel CAD w/ EF 30% & mild-mod MR; c. s/p 3 vessel CABG 07/02/13 (LIMA-LAD, SVG-OM, and SVG-RPDA);  d. 10/2015 MV: no ischemia/infarct.  . Diabetes mellitus without complication ([place]   . Diabetic neuropathy ([place]   . [facility] patient ([facility]    MWF  . ESRD (end stage renal disease) ([place]    a. 12/2015 initiated - mwf dialysis.  .Marland KitchenGERD (gastroesophageal reflux disease)   . Hyperlipidemia   . Hypertension   . Hypothyroidism   . Myocardial infarction ([facility] 2015  . Neuropathy   . Pleural effusion 2015  . Pulmonary hypertension ([place]   . Renal insufficiency   . Wears dentures    full lower    Medications:  Scheduled:  .  aspirin  81 mg Oral Daily  . B-complex with vitamin C  1 tablet Per Tube Daily  . chlorhexidine gluconate (MEDLINE KIT)  15 mL Mouth Rinse BID  . Chlorhexidine Gluconate Cloth  6 each Topical Q0600  . epoetin (EPOGEN/PROCRIT) injection  4,000 Units Intravenous Q M,W,F-HD  . fluticasone  2 spray Each Nare Daily  . free water  30 mL Per Tube Q4H  . insulin aspart  0-15 Units Subcutaneous Q4H  . levothyroxine  88 mcg Oral QAC breakfast  . mouth rinse  15 mL Mouth Rinse q12n4p  . mouth rinse  15 mL Mouth Rinse 10 times per day  . midodrine  10 mg Oral TID WC  . pantoprazole (PROTONIX) IV  40 mg Intravenous Q24H  . venlafaxine  75 mg Oral BID WC  . zinc sulfate  220 mg Oral Daily    Assessment: 63y.o. female admitted on 02/09/2019 with MRSA  bacteremia. She was on apixaban PTA but last dose was prior to admission (12/7). [place]15 revealed age-indeterminate short segment occlusive DVT involving the mid humeral aspect of one of the paired brachial veins.  Heparin was started but after repeated attempts by multiple phlebotomists lab was unable to  draw labs required for heparin monitoring. Additionally, she was too confused and disoriented to swallow oral medications. On 12/20 a femoral central line was placed. Heparin was stopped this morning due to a fall and is being restarted upon returning from HD  Heparin Course: 12/24 0231 HL 0.26: held due to unwitnessed fall and bleeding from arm 12/25 0413 HL 0.28: subtherapeutic - CBC stable.  Will increase Heparin to 900 units/hr and recheck in 8 hours. 12/25 1356 HL 0.47: therapeutic  12/25 2020 HL 0.36: therapeutic x 2 12/26 HL '@0626'$ = 0.16. Will give Heparin bolus of 2100 units and increase heparin drip to 1100 units/hr  Goal of Therapy:  Heparin Level: 0.3 - 0.7 units/mL Monitor platelets by anticoagulation protocol: Yes   Plan:  12/30 @ 0430 NP would like to continue heparin drip until 0700 today (12/30) because  patient is going for AVG  excision of right w/ Dr. Lucky Cowboy at around 1100. Patient also had a PEG tube placed yesterday (12/29); however, doesn't appear anticoagulation was restarted, therefore restarting heparin drip at previous rate of 1100 units/hr without bolus and will stop drip at 0700 in anticipation of AVG excision in AM.  Tobie Lords, PharmD Clinical Pharmacist 03/04/2019 4:26 AM

## 2019-03-04 NOTE — Plan of Care (Signed)
PMT note:  Patient is currently in ICU. Staff at bedside preparing her to go to OR. No family at bedside.

## 2019-03-04 NOTE — Progress Notes (Signed)
OT Cancellation Note  Patient Details Name: Jamie Arias MRN: [id number] DOB: 02-07-1956   Cancelled Treatment:    Reason Eval/Treat Not Completed: Medical issues which prohibited therapy(Pt. has been transferred to the ICU. Will need new OT orders when medically appropriate. Will sign off.)  Harrel Carina, MS, OTR/L 03/04/2019, 10:07 AM

## 2019-03-04 NOTE — Progress Notes (Signed)
Pharmacy Electrolyte Monitoring Consult:  Pharmacy consulted to assist in monitoring and replacing electrolytes in this 63 y.o. female admitted on 02/09/2019. Patient with ESRD; last dialysis 12/14. Patient with MRSA bacteremia. She has a h/o CAD and is s/p CABG. Hemodialysis MWF.   Labs:  Sodium (mmol/L)  Date Value  03/04/2019 138  03/30/2014 140   Potassium (mmol/L)  Date Value  03/04/2019 4.2  03/30/2014 4.3   Magnesium (mg/dL)  Date Value  03/04/2019 2.1  07/19/2013 1.7 (L)   Phosphorus (mg/dL)  Date Value  03/04/2019 3.7   Calcium (mg/dL)  Date Value  03/04/2019 8.5 (L)   Calcium, Total (mg/dL)  Date Value  03/30/2014 9.0   Albumin (g/dL)  Date Value  03/01/2019 1.8 (L)  02/11/2018 4.2  07/23/2013 2.7 (L)   Corrected Ca: 10.3 mg/dL  Goals of Therapy Given Cardiac History: Potassium 4.0 - 5.1 mmol/L Magnesium 2.0 - 2.4 mg/dL All Other Electrolytes WNL   Plan:  No supplementation at this time.  Electrolytes in am  Pharmacy will continue to monitor and adjust per consult.   Gerald Dexter, PharmD Pharmacy Resident  03/04/2019 2:01 PM

## 2019-03-04 NOTE — Progress Notes (Signed)
PULMONARY / CRITICAL CARE MEDICINE   Name: Jamie Arias MRN: [id number] DOB: 05-22-1955    ADMISSION DATE:  02/09/2019 CONSULTATION DATE:  03/03/2019  REFERRING MD:  Dr. Leslye Peer  CHIEF COMPLAINT:  Remains intubated post PEG placement 03/03/19  BRIEF DISCUSSION: 63 y.o. Female with PMH notable for ESRD on HD, COVID-19 infection in 09/2018, HFpEF, who was admitted 86/5/78 due to Metabolic Encephalopathy & Septic Shock secondary to MRSA Bacteremia.  Infectious disease is following.  Suspected source of Bacteremia is AV graft with stent.  On 12/29 she underwent PEG placement due to persistent encephalopathy and poor PO intake.  She returns to ICU post PEG and remains intubated, as she is to undergo AV Graft removal on 12/30.  Pt also has age indeterminate left arm DVT.  HISTORY OF PRESENT ILLNESS:   Jamie Arias is a 63 y.o. Female with a Past medical history notable for ESRD on hemodialysis, chronic HFpEF, + COVID-19 infection in 09/2018, anemia, CAD status post PCI, type 2 diabetes mellitus, hypertension, hyperlipidemia, hypothyroidism who presented to [facility] ED on 02/09/2019 due to altered mental status.  Upon presentation she was noted to be hypotensive and tachycardic.  She met sepsis criteria, therefore she received IV fluid resuscitation and broad-spectrum antibiotic coverage.  CT head without contrast was negative for any acute finding.  She was admitted to med-surg unit by Hospitalist for further workup and treatment of acute metabolic encephalopathy and sepsis.  On 02/10/2019 she became hypotensive and blood cultures with MRSA bacteremia, requiring transfer to stepdown unit for vasopressors.  PCCM and infectious disease were consulted for further management of septic shock and MRSA bacteremia due to unknown source.  She was also found to have an age intermediate Left Brachial vein DVT.  Her Septic shock resolved, and she became more awake, but remained encephalopathic secondary to sepsis.  MRI  negative for acute CVA.  Imaging of the spine was negative.  TEE was unable to be performed due to encephaloapthy.  She has a left heel decubitus ulcer that was present since admission, of which she was evaluated by Podiatry, and no osteomyelitis found.  Infectious disease suspects the source is her AV Fistula.  Given her persistent encephalopathy and poor po intake, she required placement of PEG tube on 03/03/19.  She returns to ICU post procedure and remains intubated, with plans for AV graft removal tomorrow on 03/04/19.  PCCM is consulted for vent management.  PAST MEDICAL HISTORY :  She  has a past medical history of Anemia, Chronic diastolic CHF (congestive heart failure) ([place]), Coronary artery disease, Diabetes mellitus without complication ([place]), Diabetic neuropathy ([place]), Dialysis patient ([place]), ESRD (end stage renal disease) ([place]), GERD (gastroesophageal reflux disease), Hyperlipidemia, Hypertension, Hypothyroidism, Myocardial infarction ([place]) (2015), Neuropathy, Pleural effusion (2015), Pulmonary hypertension ([place]), Renal insufficiency, and Wears dentures.  PAST SURGICAL HISTORY: She  has a past surgical history that includes Cataract extraction (Bilateral); Coronary artery bypass graft (N/A, 07/02/2013); Intraoprative transesophageal echocardiogram (N/A, 07/02/2013); Thoracentesis (Left, 2015); Cholecystectomy (N/A, 12/09/2014); Esophagogastroduodenoscopy (egd) with propofol (N/A, 11/24/2015); Cardiac catheterization (Right, 12/06/2015); Eye surgery (Bilateral); Cardiac catheterization; AV fistula placement (Right, 02/03/2016); PORTA CATH REMOVAL (N/A, 06/01/2016); PERIPHERAL VASCULAR THROMBECTOMY (Right, 09/28/2016); A/V Fistulagram (Right, 12/17/2016); A/V Fistulagram (Right, 01/07/2017); A/V SHUNT INTERVENTION (N/A, 02/22/2017); A/V Fistulagram (Right, 12/03/2017); PERIPHERAL VASCULAR THROMBECTOMY (Right, 05/20/2018); A/V Fistulagram (Right, 02/23/2019); and PEG placement (N/A, 03/03/2019).  Allergies   Allergen Reactions  . Nsaids Other (See Comments)    Contraindicated due to kidney disease.  Marland Kitchen Doxycycline Other (See  Comments)    tremor    No current facility-administered medications on file prior to encounter.   Current Outpatient Medications on File Prior to Encounter  Medication Sig  . ACCU-CHEK FASTCLIX LANCETS MISC Check blood glucose twice daily, diagnosis code E11.9  . acetaminophen (TYLENOL) 325 MG tablet Take 650 mg by mouth daily as needed for moderate pain or headache.   . Alirocumab (PRALUENT) 75 MG/ML SOAJ Inject 75 mg into the skin every 14 (fourteen) days.  . Amino Acids-Protein Hydrolys (FEEDING SUPPLEMENT, PRO-STAT SUGAR FREE 64,) LIQD Take 30 mLs by mouth 3 (three) times daily between meals.  Marland Kitchen amiodarone (PACERONE) 200 MG tablet Take 200 mg by mouth daily.  Marland Kitchen aspirin EC 81 MG tablet Take 81 mg by mouth daily.  Marland Kitchen atorvastatin (LIPITOR) 40 MG tablet Take 40 mg by mouth daily.  . Blood Glucose Monitoring Suppl (ACCU-CHEK AVIVA PLUS) w/Device KIT Use as directed to test blood sugar up to three times daily  . Calcium Acetate 667 MG TABS Take 2,001 mg by mouth 3 (three) times daily with meals.  Marland Kitchen ELIQUIS 5 MG TABS tablet Take 5 mg by mouth 2 (two) times daily.   . ferrous sulfate 325 (65 FE) MG tablet Take 325 mg by mouth daily with breakfast.  . FLUoxetine (PROZAC) 20 MG capsule Take 20 mg by mouth daily.  . fluticasone (FLONASE) 50 MCG/ACT nasal spray Place 2 sprays into both nostrils daily. (Patient taking differently: Place 2 sprays into both nostrils daily as needed for allergies or rhinitis. )  . furosemide (LASIX) 20 MG tablet Take 40 mg by mouth every other day. Non-dialysis days   . gabapentin (NEURONTIN) 300 MG capsule Take 300 mg by mouth 2 (two) times daily.   Marland Kitchen glucose blood (ACCU-CHEK AVIVA) test strip Use as instructed to test blood sugar up to 3 times daily  . Insulin Glargine (BASAGLAR KWIKPEN) 100 UNIT/ML SOPN Inject 18 Units into the skin daily.  .  Lancets (ACCU-CHEK SOFT TOUCH) lancets Use as instructed  . levothyroxine (SYNTHROID) 88 MCG tablet Take 88 mcg by mouth daily before breakfast.  . lidocaine-prilocaine (EMLA) cream Apply 1 application topically as needed (port access).  . midodrine (PROAMATINE) 10 MG tablet Take 1 tablet (10 mg total) by mouth 3 (three) times daily with meals. (Patient taking differently: Take 10 mg by mouth 3 (three) times daily as needed (low BP). )  . multivitamin (RENA-VIT) TABS tablet Take 1 tablet by mouth at bedtime.  . nitroGLYCERIN (NITROSTAT) 0.4 MG SL tablet DISSOLVE ONE TABLET UNDER THE TONGUE EVERY 5 MINUTES AS NEEDED FOR CHEST PAIN.  DO NOT EXCEED A TOTAL OF 3 DOSES IN 15 MINUTES (Patient taking differently: Place 0.4 mg under the tongue every 5 (five) minutes as needed for chest pain. DISSOLVE ONE TABLET UNDER THE TONGUE EVERY 5 MINUTES AS NEEDED FOR CHEST PAIN.  DO NOT EXCEED A TOTAL OF 3 DOSES IN 15 MINUTES)  . ondansetron (ZOFRAN-ODT) 4 MG disintegrating tablet Take 1 tablet (4 mg total) by mouth every 8 (eight) hours as needed for nausea or vomiting. appt with PCP further refills Dr. Caryl Bis  . pantoprazole (PROTONIX) 40 MG tablet Take 40 mg by mouth daily.  . vitamin C (VITAMIN C) 500 MG tablet Take 1 tablet (500 mg total) by mouth daily.  Marland Kitchen zinc sulfate 220 (50 Zn) MG capsule Take 1 capsule (220 mg total) by mouth daily.  . mirtazapine (REMERON) 15 MG tablet Take 1 tablet (15 mg total) by mouth  at bedtime. (Patient not taking: Reported on 02/09/2019)    FAMILY HISTORY:  Her She indicated that her mother is deceased. She indicated that her father is deceased. She indicated that her sister is alive. She indicated that the status of her maternal grandmother is unknown. She indicated that the status of her paternal grandfather is unknown. She indicated that both of her daughters are alive. She indicated that both of her sons are alive. She indicated that the status of her neg hx is  unknown.   SOCIAL HISTORY: She  reports that she has never smoked. She has never used smokeless tobacco. She reports that she does not drink alcohol or use drugs.    COVID-19 DISASTER DECLARATION:  FULL CONTACT PHYSICAL EXAMINATION WAS NOT POSSIBLE DUE TO TREATMENT OF COVID-19 AND  CONSERVATION OF PERSONAL PROTECTIVE EQUIPMENT, LIMITED EXAM FINDINGS INCLUDE-  Patient assessed or the symptoms described in the history of present illness.  In the context of the Global COVID-19 pandemic, which necessitated consideration that the patient might be at risk for infection with the SARS-CoV-2 virus that causes COVID-19, Institutional protocols and algorithms that pertain to the evaluation of patients at risk for COVID-19 are in a state of rapid change based on information released by regulatory bodies including the CDC and federal and state organizations. These policies and algorithms were followed during the patient's care while in hospital.   REVIEW OF SYSTEMS:   Unable to assess due to critical illness and intubation  SUBJECTIVE:  Unable to assess due to critical illness and intubation  VITAL SIGNS: BP (!) 134/59   Pulse (!) 55   Temp 97.9 F (36.6 C) (Axillary)   Resp 15   Ht 5' 4" (1.626 m)   Wt 75.5 kg   SpO2 100%   BMI 28.57 kg/m   HEMODYNAMICS:    VENTILATOR SETTINGS: Vent Mode: PSV FiO2 (%):  [30 %-40 %] 30 % Set Rate:  [14 bmp] 14 bmp Vt Set:  [450 mL] 450 mL PEEP:  [5 cmH20] 5 cmH20 Pressure Support:  [10 cmH20] 10 cmH20  INTAKE / OUTPUT: I/O last 3 completed shifts: In: 475.7 [I.V.:395.7; NG/GT:30; IV Piggyback:50] Out: 600 [Stool:600]  PHYSICAL EXAMINATION: General:  Acute on chronically ill appearing female, laying in bed, intubated, in no acute distress Neuro:  Opens eyes and withdraws from pain, pupils PERRLA 4 mm bilaterally HEENT:  Atraumatic, normocephalic, neck supple, no JVD, ETT in place Cardiovascular:  Regular rate & rhythm, s1s2 noted, no  M/R/G Lungs:  Clear diminished breath sounds to auscultation bilaterally, no wheezing or rhonchi noted, even, vent assisted Abdomen:  Obese, soft, nontender, nondistended, no guarding or rebound tenderness, BS hypoactive Musculoskeletal:  No deformities Skin:  Left heel stage II decubitus ulcer with black eschar, right heel stage II decubitus ulcer  LABS:  BMET Recent Labs  Lab 03/03/19 0636 03/03/19 2100 03/04/19 0417  NA 139 138 138  K 4.0 4.0 4.2  CL 101 100 101  CO2 _0 BUN 19 22 24*  CREATININE 2.44* 2.95* 3.14*  GLUCOSE 125* 74 136*    Electrolytes Recent Labs  Lab 03/02/19 0329 03/03/19 0636 03/03/19 2100 03/04/19 0417  CALCIUM 8.3* 8.3* 8.4* 8.5*  MG 1.9 2.0  --  2.1  PHOS 2.9 1.8*  --  3.7    CBC Recent Labs  Lab 03/03/19 0636 03/03/19 2139 03/04/19 0417  WBC 10.0 10.0 6.7  HGB 8.9* 8.6* 8.1*  HCT 26.3* 26.6* 24.4*  PLT 172 219 196  Coag's No results for input(s): APTT, INR in the last 168 hours.  Sepsis Markers No results for input(s): LATICACIDVEN, PROCALCITON, O2SATVEN in the last 168 hours.  ABG Recent Labs  Lab 03/03/19 1843 03/04/19 0520  PHART 7.56* 7.51*  PCO2ART 34 36  PO2ART 190* 135*    Liver Enzymes Recent Labs  Lab 02/27/19 0413 02/28/19 0626 03/01/19 0429  AST _0 ALT _1 ALKPHOS 59 63 58  BILITOT 1.7* 1.0 0.9  ALBUMIN 2.1* 2.0* 1.8*    Cardiac Enzymes No results for input(s): TROPONINI, PROBNP in the last 168 hours.  Glucose Recent Labs  Lab 03/04/19 0230 03/04/19 0420 03/04/19 0428 03/04/19 0734 03/04/19 1252 03/04/19 1541  GLUCAP 89 126* 111* 97 113* 113*    Imaging X-ray chest PA or AP  Result Date: 03/03/2019 CLINICAL DATA:  ETT placement EXAM: CHEST  1 VIEW COMPARISON:  03/01/2019 FINDINGS: Endotracheal tube with the tip 4 cm above the carina. Dual lumen left-sided central venous catheter with the tip projecting over the SVC. Bilateral mild interstitial thickening. No  pleural effusion or pneumothorax. Linear band of airspace disease in the right lung likely reflecting atelectasis. Stable cardiomegaly.  Prior CABG.  No acute osseous abnormality. IMPRESSION: 1. Cardiomegaly with pulmonary vascular congestion. 2. Linear band of airspace disease in the right lung likely reflecting atelectasis. Electronically Signed   By: Kathreen Devoid   On: 03/03/2019 17:53   DG Chest Port 1 View  Result Date: 03/04/2019 CLINICAL DATA:  Acute respiratory failure. EXAM: PORTABLE CHEST 1 VIEW COMPARISON:  March 03, 2019. FINDINGS: Stable cardiomediastinal silhouette. Endotracheal tube is unchanged in position. Left internal jugular catheter is unchanged in position. No pneumothorax or significant pleural effusion is noted. Status post coronary bypass graft. Mild bibasilar subsegmental atelectasis is noted. Bony thorax is unremarkable. IMPRESSION: Stable support apparatus. Stable mild bibasilar subsegmental atelectasis. Electronically Signed   By: Marijo Conception M.D.   On: 03/04/2019 07:54     STUDIES:  12/7 - CT Head wo Contrast>> 1. No acute intracranial abnormality. 2. Mild parenchymal atrophy and chronic microvascular angiopathy. 12/7 - 2D Echocardiogram>> EF 60-65%, Grade II Diastolic Dysfunction 68/3 - Venous US LUE>>Examination is positive for age-indeterminate short segment occlusive DVT involving the mid humeral aspect of one of the paired brachial veins. There is no extension of this short-segment occlusive DVT to the more proximal venous system of the left upper extremity. 12/10 - MR Brain w/o Contrast >>1. No acute intracranial abnormality. 2. Mild chronic small vessel disease. 12/15 - CT Cervical Spine>> No evidence of discitis/osteomyelitis or collection. 12/15 - CT Thoracic Spine>>No evidence of discitis/osteomyelitis in the thoracic spine. No collection. Bilateral pleural effusions and bibasilar atelectasis/consolidation. 12/15 - CT Lumbar Spine>> No evidence of  discitis/osteomyelitis in the lumbar spine. Nonspecific mild presacral edema. No paraspinal collection 12/16 - DG 2V Left Foot>> Calcaneal spur.  No evidence of osteomyelitis. 12/24 - CT Head wo Contrast>>1. Generalized atrophy and chronic ischemic microangiopathy without acute intracranial abnormality. 12/24 - CT Cervical Spine>> No acute fracture or static subluxation of the cervical spine.  CULTURES: SARS-CoV-2 PCR 12/7>> negative Blood culture 12/7>> MRSA MRSA PCR 12/8>> positive Blood culture 12/9>> MRSA Blood cultures 12/11>> MRSA Blood cultures 12/13>> MRSA Wound culture from left heel 12/15>> MRSA Blood cultures 12/16>> no growth to date  ANTIBIOTICS: Ceftaroline 12/14>> 12/22 Daptomycin 12/14>>12/28 Vancomycin 12/24>>  SIGNIFICANT EVENTS: 12/7>> admission to MedSurg 12/8>> transfer to stepdown due to hypotension requiring vasopressors; PCCM and infectious disease  consulted 38/93>> fall, no complications noted 73/42>> PEG tube placement, returns to ICU, remains intubated  LINES/TUBES: Right Femoral CVC 12/8>>12/14 Left IJ Tunneled HD Catheter Left Femoral CVC 12/19>> PEG tube placement 12/29>> ETT 12/29>>  ASSESSMENT / PLAN:  Post operative respiratory failure -patient was unable to be weaned off mechanical ventilation after PEG tube -patient is now s/p vascular procedure -patient has been on spontaneous breathing trial today for 2 hours - doing ok but mentation is lethargic GCS8T -keep on vent with PS 20/PEEP until tommorow   Post anesthesia/post operative transient hypotension  - s/p vasopressor support   Chronic HFpEF Paroxsymal Atrial Fibrillation ~ currently in NSR Hx: HTN, CAD s/p stent, Chronic HFpEF P:  Continuous cardiac monitoring Maintain MAP >60 (given she is on Hemodialysis) Levophed if needed to maintain MAP goal Continue Midodrine Cautious IV Fluids given ESRD on HD Volume removal with HD Heparin drip for Anticoagulation 2D  Echocardiogram 02/10/19>> EF 60-65%, grade II diastolic dysfuntion  RENAL A:   ESRD on HD P:   Monitor I&O's / urinary output Follow BMP Ensure adequate renal perfusion Avoid nephrotoxic agents as able Replace electrolytes as indicated Nephrology following, appreciate input ~ HD as per Nephrology  GASTROINTESTINAL A:   No acute issues  Hx: GERD P:   NPO for now; Able to start tube feeds on 12/30 following AV graft removal Protonix for SUP PEG placed on 03/03/19 by GI, will follow recommendations  HEMATOLOGIC A:   Left Arm DVT P:  Monitor for S/Sx of bleeding Trend CBC Heparin drip for VTE Prophylaxis/Anticoagulation  Transfuse for Hgb <7   INFECTIOUS A:   Sepsis secondary to MRSA Bacteremia (ID suspects infected AV graft with stent) P:   Monitor fever curve Trend WBC's and Procalcitonin Follow cultures as above On Vancomycin ID following, appreciate input ~ will follow recommendations CT Spine negative, TEE not performed due to encephalopathy, Left heel decubitus ulcer with MRSA (but no osteomyelitis) Plan for AV Graft removal on 12/30  ENDOCRINE A:   DM II  Hypothyoidism P:   CBG's SSI Follow ICU Hypo/hyperglycemia protocol Continue Synthroid  NEUROLOGIC A:   Acute metabolic Encephalopathy P:   RASS goal: 0 to -1 Precedex gtt and Fentanyl pushes to maintain RASS goal Avoid sedating meds as able Daily WUA MRI Brain negative EEG negative Ammonia normal TSH normal   FAMILY  - Updates: No family at bedside during NP rounds 03/03/19.  Pt prognosis is poor.  Pt is DNR.  - Inter-disciplinary family meet or Palliative Care currently following.   Critical care provider statement:    Critical care time (minutes):  109   Critical care time was exclusive of:  Separately billable procedures and  treating other patients   Critical care was necessary to treat or prevent imminent or  life-threatening deterioration of the following conditions:  altered  mental status with lethrargy, post operative respiratory failure, multiple comorbid conditions   Critical care was time spent personally by me on the following  activities:  Development of treatment plan with patient or surrogate,  discussions with consultants, evaluation of patient's response to  treatment, examination of patient, obtaining history from patient or  surrogate, ordering and performing treatments and interventions, ordering  and review of laboratory studies and re-evaluation of patient's condition   I assumed direction of critical care for this patient from another  provider in my specialty: no      Ottie Glazier, M.D.  [facility] [place] [facility]  [place]

## 2019-03-04 NOTE — Anesthesia Post-op Follow-up Note (Signed)
Anesthesia QCDR form completed.        

## 2019-03-04 NOTE — Progress Notes (Signed)
Pre HD Tx Assessment   03/04/19 2015  Neurological  Level of Consciousness Responds to Voice  Orientation Level Intubated/Tracheostomy - Unable to assess  Respiratory  Respiratory Pattern Regular;Unlabored  Chest Assessment Chest expansion symmetrical  Bilateral Breath Sounds Rhonchi;Diminished  R Upper  Breath Sounds Rhonchi  L Upper Breath Sounds Rhonchi  R Lower Breath Sounds Diminished  L Lower Breath Sounds Diminished  Cough None  Cardiac  Pulse Regular  Heart Sounds S1, S2  Jugular Venous Distention (JVD) No  Cardiac Rhythm SB  Antiarrhythmic device No  Vascular  R Radial Pulse +1  L Radial Pulse +1  R Dorsalis Pedis Pulse +1  L Dorsalis Pedis Pulse +1  Edema Generalized;Right upper extremity;Left upper extremity;Right lower extremity;Left lower extremity  Generalized Edema +2  RUE Edema +2  LUE Edema +2  RLE Edema +2  LLE Edema +2  Integumentary  Integumentary (WDL) X  Skin Color Appropriate for ethnicity  Skin Condition Dry  Skin Integrity Abrasion;Ecchymosis;MASD;Skin tear;Weeping  Ecchymosis Location Other (Comment) (Scattered)  Ecchymosis Location Orientation Other (Comment) (Scattered)  Weeping Location Arm  Weeping Location Orientation Bilateral  Musculoskeletal  Musculoskeletal (WDL) X  Generalized Weakness Yes  Gastrointestinal  Bowel Sounds Assessment Active  Last BM Date 03/04/19  GU Assessment  Genitourinary (WDL) X  Genitourinary Symptoms Anuria (ESRD Pt)  Psychosocial  Psychosocial (WDL) X  Patient Behaviors Not interactive  Needs Expressed Other (Comment) (Needs anticipated)  Psychosocial Additional Assessments Family behavior  Family Behavior Supportive;Calm  Emotional support given Given to patient

## 2019-03-04 NOTE — Op Note (Signed)
[facility]   OPERATIVE NOTE  DATE: 03/04/2019  PRE-OPERATIVE DIAGNOSIS: Persistent bacteremia, suspected infection of right arm AV graft  POST-OPERATIVE DIAGNOSIS: same as above  PROCEDURE: 1.   Removal of right arm AV graft with primary reconstruction of right brachial artery and incisional VAC placement  SURGEON: Leotis Pain  ASSISTANT(S): None  ANESTHESIA: General  ESTIMATED BLOOD LOSS: 100 cc  FINDING(S): 1.  Phlegmon around the graft at the area of the open wound.  Otherwise, the graft was reasonably well incorporated.  Multiple stents were present.   INDICATIONS:   Jamie Arias is a 63 y.o. female who presents with persistent bacteremia despite appropriate treatment.  The open wound remained present near the arterial access site and there was concern for infection as this graft had multiple stents as well.  After discussion with the nephrologist and the infectious disease doctors, we decided the removal was in her best interest.  Risks and benefits were discussed and informed consent was obtained.  DESCRIPTION: After obtaining full informed written consent, the patient was brought back to the operating room and placed supine upon the operating table.  The patient was prepped and draped in the standard fashion.  I started by creating an incision between the open wound and the antecubital fossa and dissected out the graft and its attachment to the brachial artery.  It was very well incorporated in this location and the artery was prepared for control proximally and distally.  I then turned my attention to the axillary incision.  We reopened the previous incision dissected down to the graft vein anastomosis.  At this site, there were 2 further more proximal stents and these were dissected out and pulled out gently with traction in their entirety.  The vein was then closed with a running 6-0 Prolene suture.  The axillary incision is packed off.  We had clamped  the graft proximally but not heparinized at this point.  I then gave 3000 units of intravenous heparin and clamped the brachial artery proximal and distal to the anastomosis.  The graft was then removed in its entirety off of the brachial artery.  The artery was then repaired with a series of 6 interrupted 6-0 Prolene sutures and the vessel was flushed and de-aired prior to release of control.  On release there was a palpable pulse distally. I then turned my attention to the graft itself.  I made an incision just proximal to the open wound and over the midportion of the graft.  There was a phlegmon in the area of the open wound and I ended up connecting the antecubital incision to the mid graft incision to ensure that all graft and stents were removed.  A culture swab was sent and the graft was removed its entirety as well as the multiple stents in place.  The wound was irrigated.  Several areas were controlled with electrocautery hemostasis and 2-0 silk ties as needed.  The arterial repair was closed with 2 layers of 3-0 Vicryl and then staples.  The venous repair was closed with a single layer of 3-0 Vicryl and staples.  The midportion of the graft where the phlegmon was had the deep tissues loosely closed with interrupted 3-0 Vicryl's and then staples loosely placed in the skin.  I placed an incisional VAC over the more distal incision and a honeycomb over the axillary incision. At this point, we elected to complete the procedure.  The patient was taken to the recovery room  in stable condition.   COMPLICATIONS: None  CONDITION: Stable  Leotis Pain  03/04/2019, 12:32 PM    This note was created with Dragon Medical transcription system. Any errors in dictation are purely unintentional.

## 2019-03-05 LAB — CBC
HCT: 27.2 % — ABNORMAL LOW (ref 36.0–46.0)
Hemoglobin: 8.6 g/dL — ABNORMAL LOW (ref 12.0–15.0)
MCH: 29.9 pg (ref 26.0–34.0)
MCHC: 31.6 g/dL (ref 30.0–36.0)
MCV: 94.4 fL (ref 80.0–100.0)
Platelets: 225 10*3/uL (ref 150–400)
RBC: 2.88 MIL/uL — ABNORMAL LOW (ref 3.87–5.11)
RDW: 20.3 % — ABNORMAL HIGH (ref 11.5–15.5)
WBC: 9.7 10*3/uL (ref 4.0–10.5)
nRBC: 0.4 % — ABNORMAL HIGH (ref 0.0–0.2)

## 2019-03-05 LAB — BASIC METABOLIC PANEL
Anion gap: 12 (ref 5–15)
BUN: 14 mg/dL (ref 8–23)
CO2: 26 mmol/L (ref 22–32)
Calcium: 8.5 mg/dL — ABNORMAL LOW (ref 8.9–10.3)
Chloride: 99 mmol/L (ref 98–111)
Creatinine, Ser: 2.16 mg/dL — ABNORMAL HIGH (ref 0.44–1.00)
GFR calc Af Amer: 27 mL/min — ABNORMAL LOW (ref >60)
GFR calc non Af Amer: 23 mL/min — ABNORMAL LOW (ref >60)
Glucose, Bld: 102 mg/dL — ABNORMAL HIGH (ref 70–99)
Potassium: 3.6 mmol/L (ref 3.5–5.1)
Sodium: 137 mmol/L (ref 135–145)

## 2019-03-05 LAB — GLUCOSE, CAPILLARY
Glucose-Capillary: 83 mg/dL (ref 70–99)
Glucose-Capillary: 68 mg/dL — ABNORMAL LOW (ref 70–99)
Glucose-Capillary: 97 mg/dL (ref 70–99)
Glucose-Capillary: 116 mg/dL — ABNORMAL HIGH (ref 70–99)
Glucose-Capillary: 114 mg/dL — ABNORMAL HIGH (ref 70–99)
Glucose-Capillary: 185 mg/dL — ABNORMAL HIGH (ref 70–99)
Glucose-Capillary: 234 mg/dL — ABNORMAL HIGH (ref 70–99)

## 2019-03-05 LAB — PHOSPHORUS: Phosphorus: 2.8 mg/dL (ref 2.5–4.6)

## 2019-03-05 LAB — HEPATIC FUNCTION PANEL
ALT: 16 U/L (ref 0–44)
AST: 25 U/L (ref 15–41)
Albumin: 1.9 g/dL — ABNORMAL LOW (ref 3.5–5.0)
Alkaline Phosphatase: 88 U/L (ref 38–126)
Bilirubin, Direct: 0.2 mg/dL (ref 0.0–0.2)
Indirect Bilirubin: 0.6 mg/dL (ref 0.3–0.9)
Total Bilirubin: 0.8 mg/dL (ref 0.3–1.2)
Total Protein: 5.6 g/dL — ABNORMAL LOW (ref 6.5–8.1)

## 2019-03-05 LAB — MAGNESIUM: Magnesium: 1.7 mg/dL (ref 1.7–2.4)

## 2019-03-05 LAB — HEPARIN LEVEL (UNFRACTIONATED): Heparin Unfractionated: 0.32 IU/mL (ref 0.30–0.70)

## 2019-03-05 MED ORDER — VANCOMYCIN HCL 500 MG/100ML IV SOLN
500.0000 mg | Freq: Once | INTRAVENOUS | Status: DC
  Filled 2019-03-05: qty 100

## 2019-03-05 MED ORDER — VANCOMYCIN HCL 500 MG IV SOLR
500.0000 mg | Freq: Once | INTRAVENOUS | Status: AC
  Administered 2019-03-05: 500 mg via INTRAVENOUS
  Filled 2019-03-05 (×2): qty 500

## 2019-03-05 MED ORDER — HEPARIN (PORCINE) 25000 UT/250ML-% IV SOLN
1200.0000 [IU]/h | INTRAVENOUS | Status: DC
  Administered 2019-03-05 – 2019-03-08 (×5): 1100 [IU]/h via INTRAVENOUS
  Administered 2019-03-09 – 2019-03-12 (×5): 1200 [IU]/h via INTRAVENOUS
  Filled 2019-03-05 (×9): qty 250

## 2019-03-05 MED ORDER — PRO-STAT SUGAR FREE PO LIQD
30.0000 mL | Freq: Every day | ORAL | Status: DC
  Administered 2019-03-05 – 2019-04-20 (×43): 30 mL

## 2019-03-05 MED ORDER — DEXTROSE 50 % IV SOLN
12.5000 g | INTRAVENOUS | Status: AC
  Administered 2019-03-05: 12.5 g via INTRAVENOUS
  Filled 2019-03-05: qty 50

## 2019-03-05 MED ORDER — OSMOLITE 1.5 CAL PO LIQD
1000.0000 mL | ORAL | Status: DC
  Administered 2019-03-05 – 2019-04-07 (×21): 1000 mL

## 2019-03-05 MED ORDER — MAGNESIUM SULFATE 2 GM/50ML IV SOLN
2.0000 g | Freq: Once | INTRAVENOUS | Status: AC
  Administered 2019-03-05: 2 g via INTRAVENOUS
  Filled 2019-03-05: qty 50

## 2019-03-05 MED ORDER — ORAL CARE MOUTH RINSE
15.0000 mL | Freq: Two times a day (BID) | OROMUCOSAL | Status: DC
  Administered 2019-03-05 – 2019-04-19 (×81): 15 mL via OROMUCOSAL

## 2019-03-05 MED ORDER — POTASSIUM CHLORIDE 20 MEQ PO PACK
20.0000 meq | PACK | Freq: Once | ORAL | Status: AC
  Administered 2019-03-05: 20 meq
  Filled 2019-03-05: qty 1

## 2019-03-05 NOTE — Consult Note (Signed)
Pharmacy Antibiotic Note  Jamie Arias is a 63 y.o. female admitted on 02/09/2019 with methicillin resistant Staphylococcus aureus.  Pharmacy has been consulted for vancomycin dosing (previous antibiotics included daptomycin and ceftaroline) She recently underwent PEG placement and is POD#1 for AVG removal.  Patient received vancomycin loading dose 1500 mg x1 with dialysis on 12/24.  (per ID: Post covid morbidity- Long COVID)    Plan:  Maintenance dose:  Vancomycin 750mg  IV qHD MWF  Patient received dose 12/28 with HD, but missed dose on 12/30 due to surgery.  However, patient received shorter 2 hour HD session on 12/30.  Based on pharmacokinetic estimation, patient is likely subtherapeutic (calculated level ~18).  Therefore, give vancomycin 500 mg x1 dose tonight.  Plan to draw random vancomycin level tomorrow before HD (12/31).   Goal pre-HD vanco level is 20 to 25 per Dr. Delaine [name].  Height: 5' 4.02" (162.6 cm) Weight: 166 lb 7.2 oz (75.5 kg) IBW/kg (Calculated) : 54.74  Temp (24hrs), Avg:98 F (36.7 C), Min:97.7 F (36.5 C), Max:98.7 F (37.1 C)  Recent Labs  Lab 03/02/19 0329 03/02/19 0330 03/03/19 0636 03/03/19 2100 03/03/19 2139 03/04/19 0417 03/05/19 0356  WBC 10.4  --  10.0  --  10.0 6.7 9.7  CREATININE 3.61*  --  2.44* 2.95*  --  3.14* 2.16*  VANCORANDOM  --  19  --   --   --   --   --     Estimated Creatinine Clearance: 25.8 mL/min (A) (by C-G formula based on SCr of 2.16 mg/dL (H)).    Antimicrobials this admission: ceftaroline 12/14 >> 12/21 daptomycin 12/14 >> 12/26 Vancomycin 12/24 >>  Microbiology results: 12/30 Aerobic/Anaerobic Cx:  NG 12/16 BCx: NG 12/15 WCx MRSA 12/13 BCx: MRSA   Thank you for allowing pharmacy to be a part of this patient's care.  Gerald Dexter, PharmD Pharmacy Resident  03/05/2019 2:30 PM

## 2019-03-05 NOTE — Progress Notes (Signed)
Pt extubated without complications. Placed on 2lpm [place], sats 98%, respiratory rate 18/min. Will continue to monitor.

## 2019-03-05 NOTE — Progress Notes (Signed)
Subjective  - POD #1, s/p removal of infected AVGG  Remains intubated VAC dressing had to be changed post op   Physical Exam:  Provena vac with good seal.   Right arm is well perfused   Wound cultures pending    Assessment/Plan:  POD #1  Continue supportive care Vac with good seal this am, will plan on changing Friday or Saturday Continue IV abx  Jamie Arias  03/05/2019 9:05 AM --  Vitals:   03/05/19 0700 03/05/19 0821  BP: (!) 120/53   Pulse: 65   Resp: (!) 0   Temp:    SpO2: 98% 98%    Intake/Output Summary (Last 24 hours) at 03/05/2019 0905 Last data filed at 03/05/2019 0400 Gross per 24 hour  Intake 1225.73 ml  Output 1110 ml  Net 115.73 ml     Laboratory CBC    Component Value Date/Time   WBC 9.7 03/05/2019 0356   HGB 8.6 (L) 03/05/2019 0356   HGB 10.5 (L) 07/23/2013 0741   HCT 27.2 (L) 03/05/2019 0356   HCT 27.6 (L) 02/25/2019 0845   PLT 225 03/05/2019 0356   PLT 275 07/23/2013 0741    BMET    Component Value Date/Time   NA 137 03/05/2019 0356   NA 140 03/30/2014 1611   K 3.6 03/05/2019 0356   K 4.3 03/30/2014 1611   CL 99 03/05/2019 0356   CL 105 03/30/2014 1611   CO2 26 03/05/2019 0356   CO2 29 03/30/2014 1611   GLUCOSE 102 (H) 03/05/2019 0356   GLUCOSE 187 (H) 03/30/2014 1611   BUN 14 03/05/2019 0356   BUN 21 (H) 03/30/2014 1611   CREATININE 2.16 (H) 03/05/2019 0356   CREATININE 3.14 (H) 09/09/2015 1534   CALCIUM 8.5 (L) 03/05/2019 0356   CALCIUM 9.0 03/30/2014 1611   GFRNONAA 23 (L) 03/05/2019 0356   GFRNONAA 36 (L) 03/30/2014 1611   GFRNONAA >60 07/23/2013 0741   GFRAA 27 (L) 03/05/2019 0356   GFRAA 44 (L) 03/30/2014 1611   GFRAA >60 07/23/2013 0741    COAG Lab Results  Component Value Date   INR 1.3 (H) 02/17/2019   INR 1.1 02/10/2019   INR 1.1 02/09/2019   No results found for: PTT  Antibiotics Anti-infectives (From admission, onward)   Start     Dose/Rate Route Frequency Ordered Stop   03/04/19 0047   ceFAZolin (ANCEF) IVPB 2g/100 mL premix     2 g 200 mL/hr over 30 Minutes Intravenous 30 min pre-op 03/04/19 0047 03/04/19 1036   03/03/19 1645  vancomycin (VANCOREADY) IVPB 1250 mg/250 mL  Status:  Discontinued     1,250 mg 166.7 mL/hr over 90 Minutes Intravenous  Once 03/03/19 1644 03/03/19 1720   02/27/19 1200  vancomycin (VANCOREADY) IVPB 750 mg/150 mL     750 mg 150 mL/hr over 60 Minutes Intravenous Every M-W-F (Hemodialysis) 02/26/19 0935     02/26/19 1200  vancomycin (VANCOREADY) IVPB 1500 mg/300 mL     1,500 mg 150 mL/hr over 120 Minutes Intravenous  Once 02/26/19 0935 02/27/19 0956   02/18/19 2000  DAPTOmycin (CUBICIN) 650 mg in sodium chloride 0.9 % IVPB  Status:  Discontinued     650 mg 226 mL/hr over 30 Minutes Intravenous Every 48 hours 02/16/19 1026 03/02/19 1539   02/16/19 1200  DAPTOmycin (CUBICIN) 650 mg in sodium chloride 0.9 % IVPB     650 mg 226 mL/hr over 30 Minutes Intravenous  Once 02/16/19 1019 02/16/19 1232   02/16/19  1200  ceftaroline (TEFLARO) 300 mg in sodium chloride 0.9 % 250 mL IVPB  Status:  Discontinued     300 mg 250 mL/hr over 60 Minutes Intravenous Every 12 hours 02/16/19 1019 02/23/19 1546   02/16/19 0824  vancomycin variable dose per unstable renal function (pharmacist dosing)  Status:  Discontinued      Does not apply See admin instructions 02/16/19 0824 02/16/19 1019   02/14/19 1200  vancomycin (VANCOCIN) IVPB 1000 mg/200 mL premix     1,000 mg 200 mL/hr over 60 Minutes Intravenous  Once 02/13/19 1058 02/14/19 1548   02/12/19 1400  vancomycin (VANCOCIN) IVPB 1000 mg/200 mL premix     1,000 mg 200 mL/hr over 60 Minutes Intravenous  Once 02/11/19 1204 02/12/19 1400   02/11/19 1200  vancomycin (VANCOCIN) 500 mg in sodium chloride 0.9 % 100 mL IVPB  Status:  Discontinued     500 mg 100 mL/hr over 60 Minutes Intravenous Every M-W-F (Hemodialysis) 02/09/19 1726 02/10/19 0906   02/11/19 1200  vancomycin (VANCOCIN) IVPB 1000 mg/200 mL premix  Status:   Discontinued     1,000 mg 200 mL/hr over 60 Minutes Intravenous Every M-W-F (Hemodialysis) 02/10/19 0906 02/11/19 1204   02/10/19 1200  vancomycin (VANCOCIN) IVPB 1000 mg/200 mL premix  Status:  Discontinued     1,000 mg 200 mL/hr over 60 Minutes Intravenous  Once 02/10/19 0908 02/10/19 0922   02/10/19 1200  vancomycin (VANCOCIN) 1,500 mg in sodium chloride 0.9 % 500 mL IVPB  Status:  Discontinued     1,500 mg 250 mL/hr over 120 Minutes Intravenous  Once 02/10/19 0922 02/10/19 0933   02/10/19 1200  vancomycin (VANCOCIN) 1,500 mg in sodium chloride 0.9 % 500 mL IVPB     1,500 mg 250 mL/hr over 120 Minutes Intravenous  Once 02/10/19 0933 02/10/19 1232   02/09/19 2200  piperacillin-tazobactam (ZOSYN) IVPB 3.375 g  Status:  Discontinued     3.375 g 12.5 mL/hr over 240 Minutes Intravenous Every 12 hours 02/09/19 1719 02/10/19 0923   02/09/19 1718  vancomycin variable dose per unstable renal function (pharmacist dosing)  Status:  Discontinued      Does not apply See admin instructions 02/09/19 1719 02/09/19 1726   02/09/19 1500  ceFEPIme (MAXIPIME) 2 g in sodium chloride 0.9 % 100 mL IVPB     2 g 200 mL/hr over 30 Minutes Intravenous  Once 02/09/19 1453 02/09/19 1622   02/09/19 1500  metroNIDAZOLE (FLAGYL) IVPB 500 mg     500 mg 100 mL/hr over 60 Minutes Intravenous  Once 02/09/19 1453 02/09/19 1756   02/09/19 1500  vancomycin (VANCOCIN) IVPB 1000 mg/200 mL premix     1,000 mg 200 mL/hr over 60 Minutes Intravenous  Once 02/09/19 1453 02/09/19 1756       V. Leia Alf, M.D., [facility] [place] Office: [phone] Pager:  [phone]

## 2019-03-05 NOTE — Progress Notes (Signed)
Pharmacy Electrolyte Monitoring Consult:  Pharmacy consulted to assist in monitoring and replacing electrolytes in this 63 y.o. female admitted on 02/09/2019. Patient with ESRD. Patient with MRSA bacteremia. She has a h/o CAD and is s/p CABG. Hemodialysis MWF.   Labs:  Sodium (mmol/L)  Date Value  03/05/2019 137  03/30/2014 140   Potassium (mmol/L)  Date Value  03/05/2019 3.6  03/30/2014 4.3   Magnesium (mg/dL)  Date Value  03/05/2019 1.7  07/19/2013 1.7 (L)   Phosphorus (mg/dL)  Date Value  03/05/2019 2.8   Calcium (mg/dL)  Date Value  03/05/2019 8.5 (L)   Calcium, Total (mg/dL)  Date Value  03/30/2014 9.0   Albumin (g/dL)  Date Value  03/05/2019 1.9 (L)  02/11/2018 4.2  07/23/2013 2.7 (L)   Corrected Ca: 9.8 mg/dL  Goals of Therapy Given Cardiac History: Potassium 4.0 - 5.1 mmol/L Magnesium 2.0 - 2.4 mg/dL All Other Electrolytes WNL   Plan: Potassium trending down.  Will give potassium 20 mEq x 1, as well as mag sulfate 2 g x 1 dose to move closer to goal, and act conservatively in the setting of HD.    Pharmacy will continue to monitor and adjust per consult.   Gerald Dexter, PharmD Pharmacy Resident  03/05/2019 12:46 PM

## 2019-03-05 NOTE — Progress Notes (Signed)
[name], female   DOB: [birth year], 63 y.o.   MRN: [id number] [facility] PROGRESS NOTE  Jamie Arias [id number] DOB: 1955/07/17 DOA: 02/09/2019 PCP: [name], MD  HPI/Subjective: Patient seen this morning.  She opened her eyes when I came in the room and shouted her name.  She did not follow commands.  She was currently intubated.  In speaking with the nurse this afternoon she was extubated.  Objective: Vitals:   03/05/19 1200 03/05/19 1300  BP: (!) 143/57   Pulse: 65 (!) 59  Resp: (!) 5 (!) 8  Temp: 97.7 F (36.5 C)   SpO2: 100% 100%    Filed Weights   02/25/19 0842 02/25/19 1200 03/02/19 0930  Weight: 74.1 kg 73.2 kg 75.5 kg    ROS: Review of Systems  Unable to perform ROS: Acuity of condition   Exam: Physical Exam  Constitutional: She appears lethargic.  HENT:  Nose: No mucosal edema.  Eyes: Pupils are equal, round, and reactive to light. Conjunctivae and lids are normal.  Neck: Carotid bruit is not present.  Cardiovascular: Regular rhythm, S1 normal and S2 normal. Exam reveals no gallop.  No murmur heard. Respiratory: No respiratory distress. She has decreased breath sounds in the right lower field and the left lower field. She has no wheezes. She has no rhonchi. She has no rales.  GI: Soft. Bowel sounds are normal. There is no abdominal tenderness.  Musculoskeletal:     Right ankle: No swelling.     Left ankle: No swelling.  Lymphadenopathy:    She has no cervical adenopathy.  Neurological: She appears lethargic.  Opens eyes to voice today  Skin: Skin is warm. Nails show no clubbing.  Left heel stage II decubitus ulcer with black eschar. Right heel stage II decubitus ulcer As per nursing staff stage I decubitus ulcer sacrum Bruising upper extremities Wound VAC over right arm  Psychiatric:  Opened eyes to my voice today      Data Reviewed: Basic Metabolic Panel: Recent Labs  Lab 03/01/19 0429 03/01/19 0429  03/01/19 2014 03/02/19 0329 03/03/19 0636 03/03/19 2100 03/04/19 0417 03/05/19 0356  NA 135  --   --  134* 139 138 138 137  K 4.1   < > 4.2 4.4 4.0 4.0 4.2 3.6  CL 96*  --   --  96* 101 100 101 99  CO2 26  --   --  _0 GLUCOSE 256*  --   --  130* 125* 74 136* 102*  BUN 21  --   --  28* 19 22 24* 14  CREATININE 3.03*  --   --  3.61* 2.44* 2.95* 3.14* 2.16*  CALCIUM 8.2*  --   --  8.3* 8.3* 8.4* 8.5* 8.5*  MG 1.9  --   --  1.9 2.0  --  2.1 1.7  PHOS 1.9*  --  3.2 2.9 1.8*  --  3.7 2.8   < > = values in this interval not displayed.   Liver Function Tests: Recent Labs  Lab 02/27/19 0413 02/28/19 0626 03/01/19 0429 03/05/19 0356  AST _1 ALT _2 ALKPHOS 59 63 58 88  BILITOT 1.7* 1.0 0.9 0.8  PROT 5.0* 4.9* 4.6* 5.6*  ALBUMIN 2.1* 2.0* 1.8* 1.9*   CBC: Recent Labs  Lab 03/02/19 0329 03/03/19 0636 03/03/19 2139 03/04/19 0417 03/05/19 0356  WBC 10.4 10.0 10.0 6.7 9.7  HGB  8.2* 8.9* 8.6* 8.1* 8.6*  HCT 24.1* 26.3* 26.6* 24.4* 27.2*  MCV 87.3 88.3 93.7 89.7 94.4  PLT 197 172 219 196 225   Cardiac Enzymes: Recent Labs  Lab 03/02/19 0330  CKTOTAL 31*   BNP (last 3 results) Recent Labs    03/01/19 0429  BNP 720.0*     CBG: Recent Labs  Lab 03/04/19 2356 03/05/19 0341 03/05/19 0739 03/05/19 0829 03/05/19 1109  GLUCAP 131* 83 68* 97 116*     Studies: X-ray chest PA or AP  Result Date: 03/03/2019 CLINICAL DATA:  ETT placement EXAM: CHEST  1 VIEW COMPARISON:  03/01/2019 FINDINGS: Endotracheal tube with the tip 4 cm above the carina. Dual lumen left-sided central venous catheter with the tip projecting over the SVC. Bilateral mild interstitial thickening. No pleural effusion or pneumothorax. Linear band of airspace disease in the right lung likely reflecting atelectasis. Stable cardiomegaly.  Prior CABG.  No acute osseous abnormality. IMPRESSION: 1. Cardiomegaly with pulmonary vascular congestion. 2. Linear band of airspace  disease in the right lung likely reflecting atelectasis. Electronically Signed   By: Kathreen Devoid   On: 03/03/2019 17:53   DG Chest Port 1 View  Result Date: 03/04/2019 CLINICAL DATA:  Acute respiratory failure. EXAM: PORTABLE CHEST 1 VIEW COMPARISON:  March 03, 2019. FINDINGS: Stable cardiomediastinal silhouette. Endotracheal tube is unchanged in position. Left internal jugular catheter is unchanged in position. No pneumothorax or significant pleural effusion is noted. Status post coronary bypass graft. Mild bibasilar subsegmental atelectasis is noted. Bony thorax is unremarkable. IMPRESSION: Stable support apparatus. Stable mild bibasilar subsegmental atelectasis. Electronically Signed   By: Marijo Conception M.D.   On: 03/04/2019 07:54    Scheduled Meds: . aspirin  81 mg Oral Daily  . B-complex with vitamin C  1 tablet Per Tube Daily  . chlorhexidine gluconate (MEDLINE KIT)  15 mL Mouth Rinse BID  . Chlorhexidine Gluconate Cloth  6 each Topical Q0600  . epoetin (EPOGEN/PROCRIT) injection  4,000 Units Intravenous Q M,W,F-HD  . feeding supplement (PRO-STAT SUGAR FREE 64)  30 mL Per Tube Daily  . fluticasone  2 spray Each Nare Daily  . free water  30 mL Per Tube Q4H  . insulin aspart  0-15 Units Subcutaneous Q4H  . levothyroxine  88 mcg Oral QAC breakfast  . mouth rinse  15 mL Mouth Rinse q12n4p  . mouth rinse  15 mL Mouth Rinse 10 times per day  . midodrine  10 mg Oral TID WC  . pantoprazole (PROTONIX) IV  40 mg Intravenous Q24H  . potassium chloride  20 mEq Per Tube Once  . venlafaxine  75 mg Oral BID WC  . zinc sulfate  220 mg Oral Daily   Continuous Infusions: . sodium chloride Stopped (03/01/19 0429)  . sodium chloride    . dexmedetomidine (PRECEDEX) IV infusion 0.6 mcg/kg/hr (03/05/19 1018)  . feeding supplement (OSMOLITE 1.5 CAL)    . heparin 1,100 Units/hr (03/05/19 1241)  . magnesium sulfate bolus IVPB    . norepinephrine (LEVOPHED) Adult infusion Stopped (03/05/19 0800)   . vancomycin Stopped (03/02/19 1752)    Assessment/Plan:   1. Sepsis secondary to MRSA bacteremia.  Currently on vancomycin.   Appreciate ID consultation.  Will need 6-week long course of IV therapy.  Vascular removed her graft right arm on 03/04/2019 which could be the source of infection. 2. End-stage renal disease on hemodialysis.  Tunneled catheter placed by vascular.  Graft removal right arm on 03/04/2019 3. Acute  metabolic encephalopathy.  Mental status still impaired but opens her eyes today to my voice.  MRI of the brain negative.  EEG negative.  Ammonia normal.  TSH normal.  In speaking with prior rounding physicians patient's mental status has been impaired for a while now.  Overall prognosis poor.  Patient is total care.  Wondering if her mental status could be post Covid encephalopathy. 4. Left arm DVT.  Restarted heparin. 5. Paroxysmal atrial  fibrillation on amiodarone.  Restarted heparin 6. Chronic diastolic congestive heart failure.  Dialysis to manage fluid status. 7. Type 2 diabetes mellitus.  On sliding scale.  Will likely have to restart Lantus once tube feedings restarted 8. Hypothyroidism unspecified on Synthroid 9. Overall prognosis is poor.  Palliative care following.  Patient a DNR. 10. Diarrhea.  Hold off on stool studies at this time.  11. Present on admission, stage II decubiti right heel, left heel and stage I decubiti on buttock 12. Nutrition: PEG placed yesterday.  Hopefully can start tube feedings soon. 13. Acute hypoxic respiratory failure.  Patient intubated for PEG procedure and extubated today.  Oxygen via nasal cannula.  Code Status:     Code Status Orders  (From admission, onward)         Start     Ordered   02/21/19 0930  Do not attempt resuscitation (DNR)  Continuous    Question Answer Comment  In the event of cardiac or respiratory ARREST Do not call a "code blue"   In the event of cardiac or respiratory ARREST Do not perform Intubation, CPR,  defibrillation or ACLS   In the event of cardiac or respiratory ARREST Use medication by any route, position, wound care, and other measures to relive pain and suffering. May use oxygen, suction and manual treatment of airway obstruction as needed for comfort.      02/21/19 0932        Code Status History    Date Active Date Inactive Code Status Order ID Comments User Context   02/09/2019 1755 02/21/2019 0932 Full Code [id number]  Thornell Mule, MD ED   08/24/2018 0022 09/15/2018 1720 Full Code [id number]  Shela Leff, MD Inpatient   09/04/2017 1510 09/06/2017 1920 Full Code [id number]  Epifanio Lesches, MD ED   02/15/2017 1243 02/16/2017 2023 Full Code [id number]  Saundra Shelling, MD Inpatient   10/23/2015 0828 10/23/2015 0908 Full Code [id number]  Saundra Shelling, MD Inpatient   09/03/2015 0130 09/03/2015 1619 Full Code [id number]  Lance Coon, MD Inpatient   07/02/2013 1327 07/14/2013 1540 Full Code [id number]  Nani Skillern, PA-C Inpatient   06/29/2013 2153 07/02/2013 1327 Full Code [id number]  Larey Dresser, MD Inpatient   Advance Care Planning Activity     Family Communication: Spoke with husband yesterday on the phone Disposition Plan: Transitional care team to look into LTAC facility  Consultants:  Nephrology  Infectious disease  Vascular surgery  Critical care specialist  Antibiotics:  Vancomycin  Time spent: 27 minutes  [place]

## 2019-03-05 NOTE — Progress Notes (Signed)
PULMONARY / CRITICAL CARE MEDICINE   Name: Jamie Arias MRN: [id number] DOB: 13-Dec-1955    ADMISSION DATE:  02/09/2019 CONSULTATION DATE:  03/03/2019  REFERRING MD:  Dr. Leslye Peer  CHIEF COMPLAINT:  Remains intubated post PEG placement 03/03/19  BRIEF DISCUSSION: 63 y.o. Female with PMH notable for ESRD on HD, COVID-19 infection in 09/2018, HFpEF, who was admitted 65/7/84 due to Metabolic Encephalopathy & Septic Shock secondary to MRSA Bacteremia.  Infectious disease is following.  Suspected source of Bacteremia is AV graft with stent.  On 12/29 she underwent PEG placement due to persistent encephalopathy and poor PO intake.  She returns to ICU post PEG and remains intubated, as she is to undergo AV Graft removal on 12/30.  Pt also has age indeterminate left arm DVT.  HISTORY OF PRESENT ILLNESS:   Jamie Arias is a 63 y.o. Female with a Past medical history notable for ESRD on hemodialysis, chronic HFpEF, + COVID-19 infection in 09/2018, anemia, CAD status post PCI, type 2 diabetes mellitus, hypertension, hyperlipidemia, hypothyroidism who presented to [facility] ED on 02/09/2019 due to altered mental status.  Upon presentation she was noted to be hypotensive and tachycardic.  She met sepsis criteria, therefore she received IV fluid resuscitation and broad-spectrum antibiotic coverage.  CT head without contrast was negative for any acute finding.  She was admitted to med-surg unit by Hospitalist for further workup and treatment of acute metabolic encephalopathy and sepsis.  On 02/10/2019 she became hypotensive and blood cultures with MRSA bacteremia, requiring transfer to stepdown unit for vasopressors.  PCCM and infectious disease were consulted for further management of septic shock and MRSA bacteremia due to unknown source.  She was also found to have an age intermediate Left Brachial vein DVT.  Her Septic shock resolved, and she became more awake, but remained encephalopathic secondary to sepsis.  MRI  negative for acute CVA.  Imaging of the spine was negative.  TEE was unable to be performed due to encephaloapthy.  She has a left heel decubitus ulcer that was present since admission, of which she was evaluated by Podiatry, and no osteomyelitis found.  Infectious disease suspects the source is her AV Fistula.  Given her persistent encephalopathy and poor po intake, she required placement of PEG tube on 03/03/19.  She returns to ICU post procedure and remains intubated, with plans for AV graft removal tomorrow on 03/04/19.  PCCM is consulted for vent management.  PAST MEDICAL HISTORY :  She  has a past medical history of Anemia, Chronic diastolic CHF (congestive heart failure) ([place]), Coronary artery disease, Diabetes mellitus without complication ([place]), Diabetic neuropathy ([place]), Dialysis patient ([place]), ESRD (end stage renal disease) ([place]), GERD (gastroesophageal reflux disease), Hyperlipidemia, Hypertension, Hypothyroidism, Myocardial infarction ([place]) (2015), Neuropathy, Pleural effusion (2015), Pulmonary hypertension ([place]), Renal insufficiency, and Wears dentures.  PAST SURGICAL HISTORY: She  has a past surgical history that includes Cataract extraction (Bilateral); Coronary artery bypass graft (N/A, 07/02/2013); Intraoprative transesophageal echocardiogram (N/A, 07/02/2013); Thoracentesis (Left, 2015); Cholecystectomy (N/A, 12/09/2014); Esophagogastroduodenoscopy (egd) with propofol (N/A, 11/24/2015); Cardiac catheterization (Right, 12/06/2015); Eye surgery (Bilateral); Cardiac catheterization; AV fistula placement (Right, 02/03/2016); PORTA CATH REMOVAL (N/A, 06/01/2016); PERIPHERAL VASCULAR THROMBECTOMY (Right, 09/28/2016); A/V Fistulagram (Right, 12/17/2016); A/V Fistulagram (Right, 01/07/2017); A/V SHUNT INTERVENTION (N/A, 02/22/2017); A/V Fistulagram (Right, 12/03/2017); PERIPHERAL VASCULAR THROMBECTOMY (Right, 05/20/2018); A/V Fistulagram (Right, 02/23/2019); PEG placement (N/A, 03/03/2019); and Arteriovenous  goretex graft removal (Right, 03/04/2019).  Allergies  Allergen Reactions  . Nsaids Other (See Comments)    Contraindicated due to kidney  disease.  Marland Kitchen Doxycycline Other (See Comments)    tremor    No current facility-administered medications on file prior to encounter.   Current Outpatient Medications on File Prior to Encounter  Medication Sig  . ACCU-CHEK FASTCLIX LANCETS MISC Check blood glucose twice daily, diagnosis code E11.9  . acetaminophen (TYLENOL) 325 MG tablet Take 650 mg by mouth daily as needed for moderate pain or headache.   . Alirocumab (PRALUENT) 75 MG/ML SOAJ Inject 75 mg into the skin every 14 (fourteen) days.  . Amino Acids-Protein Hydrolys (FEEDING SUPPLEMENT, PRO-STAT SUGAR FREE 64,) LIQD Take 30 mLs by mouth 3 (three) times daily between meals.  Marland Kitchen amiodarone (PACERONE) 200 MG tablet Take 200 mg by mouth daily.  Marland Kitchen aspirin EC 81 MG tablet Take 81 mg by mouth daily.  Marland Kitchen atorvastatin (LIPITOR) 40 MG tablet Take 40 mg by mouth daily.  . Blood Glucose Monitoring Suppl (ACCU-CHEK AVIVA PLUS) w/Device KIT Use as directed to test blood sugar up to three times daily  . Calcium Acetate 667 MG TABS Take 2,001 mg by mouth 3 (three) times daily with meals.  Marland Kitchen ELIQUIS 5 MG TABS tablet Take 5 mg by mouth 2 (two) times daily.   . ferrous sulfate 325 (65 FE) MG tablet Take 325 mg by mouth daily with breakfast.  . FLUoxetine (PROZAC) 20 MG capsule Take 20 mg by mouth daily.  . fluticasone (FLONASE) 50 MCG/ACT nasal spray Place 2 sprays into both nostrils daily. (Patient taking differently: Place 2 sprays into both nostrils daily as needed for allergies or rhinitis. )  . furosemide (LASIX) 20 MG tablet Take 40 mg by mouth every other day. Non-dialysis days   . gabapentin (NEURONTIN) 300 MG capsule Take 300 mg by mouth 2 (two) times daily.   Marland Kitchen glucose blood (ACCU-CHEK AVIVA) test strip Use as instructed to test blood sugar up to 3 times daily  . Insulin Glargine (BASAGLAR KWIKPEN) 100  UNIT/ML SOPN Inject 18 Units into the skin daily.  . Lancets (ACCU-CHEK SOFT TOUCH) lancets Use as instructed  . levothyroxine (SYNTHROID) 88 MCG tablet Take 88 mcg by mouth daily before breakfast.  . lidocaine-prilocaine (EMLA) cream Apply 1 application topically as needed (port access).  . midodrine (PROAMATINE) 10 MG tablet Take 1 tablet (10 mg total) by mouth 3 (three) times daily with meals. (Patient taking differently: Take 10 mg by mouth 3 (three) times daily as needed (low BP). )  . multivitamin (RENA-VIT) TABS tablet Take 1 tablet by mouth at bedtime.  . nitroGLYCERIN (NITROSTAT) 0.4 MG SL tablet DISSOLVE ONE TABLET UNDER THE TONGUE EVERY 5 MINUTES AS NEEDED FOR CHEST PAIN.  DO NOT EXCEED A TOTAL OF 3 DOSES IN 15 MINUTES (Patient taking differently: Place 0.4 mg under the tongue every 5 (five) minutes as needed for chest pain. DISSOLVE ONE TABLET UNDER THE TONGUE EVERY 5 MINUTES AS NEEDED FOR CHEST PAIN.  DO NOT EXCEED A TOTAL OF 3 DOSES IN 15 MINUTES)  . ondansetron (ZOFRAN-ODT) 4 MG disintegrating tablet Take 1 tablet (4 mg total) by mouth every 8 (eight) hours as needed for nausea or vomiting. appt with PCP further refills Dr. Caryl Bis  . pantoprazole (PROTONIX) 40 MG tablet Take 40 mg by mouth daily.  . vitamin C (VITAMIN C) 500 MG tablet Take 1 tablet (500 mg total) by mouth daily.  Marland Kitchen zinc sulfate 220 (50 Zn) MG capsule Take 1 capsule (220 mg total) by mouth daily.  . mirtazapine (REMERON) 15 MG tablet Take 1  tablet (15 mg total) by mouth at bedtime. (Patient not taking: Reported on 02/09/2019)    FAMILY HISTORY:  Her She indicated that her mother is deceased. She indicated that her father is deceased. She indicated that her sister is alive. She indicated that the status of her maternal grandmother is unknown. She indicated that the status of her paternal grandfather is unknown. She indicated that both of her daughters are alive. She indicated that both of her sons are alive. She  indicated that the status of her neg hx is unknown.   SOCIAL HISTORY: She  reports that she has never smoked. She has never used smokeless tobacco. She reports that she does not drink alcohol or use drugs.    Physical Exam Constitutional:      Appearance: She is obese. She is ill-appearing.  HENT:     Head: Normocephalic.     Nose: Nose normal.     Mouth/Throat:     Mouth: Mucous membranes are moist.  Eyes:     Extraocular Movements: Extraocular movements intact.  Cardiovascular:     Rate and Rhythm: Regular rhythm.  Pulmonary:     Effort: Pulmonary effort is normal.  Abdominal:     Palpations: Abdomen is soft.  Musculoskeletal:     Cervical back: Normal range of motion and neck supple.  Skin:    Coloration: Skin is jaundiced.  Neurological:     Mental Status: She is disoriented.      REVIEW OF SYSTEMS:   Unable to assess due to critical illness and intubation  SUBJECTIVE:  Unable to assess due to critical illness and intubation  VITAL SIGNS: BP (!) 110/42   Pulse (!) 56   Temp 97.8 F (36.6 C) (Axillary)   Resp 20   Ht 5' 4.02" (1.626 m)   Wt 75.5 kg   SpO2 96%   BMI 28.56 kg/m   HEMODYNAMICS:    VENTILATOR SETTINGS: Vent Mode: Spontaneous FiO2 (%):  [24 %-40 %] 24 % PEEP:  [5 cmH20] 5 cmH20 Pressure Support:  [5 cmH20-10 cmH20] 5 cmH20 Plateau Pressure:  [12 cmH20-15 cmH20] 12 cmH20  INTAKE / OUTPUT: I/O last 3 completed shifts: In: 2004.3 [I.V.:1744.3; Other:180; NG/GT:30; IV Piggyback:50] Out: 7616 [Drains:100; Other:500; Stool:900; Blood:10]  PHYSICAL EXAMINATION: General:  Acute on chronically ill appearing female, laying in bed, intubated, in no acute distress Neuro:  Opens eyes and withdraws from pain, pupils PERRLA 4 mm bilaterally HEENT:  Atraumatic, normocephalic, neck supple, no JVD, ETT in place Cardiovascular:  Regular rate & rhythm, s1s2 noted, no M/R/G Lungs:  Clear diminished breath sounds to auscultation bilaterally, no  wheezing or rhonchi noted, even, vent assisted Abdomen:  Obese, soft, nontender, nondistended, no guarding or rebound tenderness, BS hypoactive Musculoskeletal:  No deformities Skin:  Left heel stage II decubitus ulcer with black eschar, right heel stage II decubitus ulcer  LABS:  BMET Recent Labs  Lab 03/03/19 2100 03/04/19 0417 03/05/19 0356  NA 138 138 137  K 4.0 4.2 3.6  CL 100 101 99  CO2 '28 28 26  '$ BUN 22 24* 14  CREATININE 2.95* 3.14* 2.16*  GLUCOSE 74 136* 102*    Electrolytes Recent Labs  Lab 03/03/19 0636 03/03/19 2100 03/04/19 0417 03/05/19 0356  CALCIUM 8.3* 8.4* 8.5* 8.5*  MG 2.0  --  2.1 1.7  PHOS 1.8*  --  3.7 2.8    CBC Recent Labs  Lab 03/03/19 2139 03/04/19 0417 03/05/19 0356  WBC 10.0 6.7 9.7  HGB 8.6*  8.1* 8.6*  HCT 26.6* 24.4* 27.2*  PLT 219 196 225    Coag's No results for input(s): APTT, INR in the last 168 hours.  Sepsis Markers No results for input(s): LATICACIDVEN, PROCALCITON, O2SATVEN in the last 168 hours.  ABG Recent Labs  Lab 03/03/19 1843 03/04/19 0520  PHART 7.56* 7.51*  PCO2ART 34 36  PO2ART 190* 135*    Liver Enzymes Recent Labs  Lab 02/27/19 0413 02/28/19 0626 03/01/19 0429  AST '22 22 22  '$ ALT '18 19 17  '$ ALKPHOS 59 63 58  BILITOT 1.7* 1.0 0.9  ALBUMIN 2.1* 2.0* 1.8*    Cardiac Enzymes No results for input(s): TROPONINI, PROBNP in the last 168 hours.  Glucose Recent Labs  Lab 03/04/19 1541 03/04/19 1923 03/04/19 2356 03/05/19 0341 03/05/19 0739 03/05/19 0829  GLUCAP 113* 164* 131* 83 68* 97    Imaging No results found.   STUDIES:  12/7 - CT Head wo Contrast>> 1. No acute intracranial abnormality. 2. Mild parenchymal atrophy and chronic microvascular angiopathy. 12/7 - 2D Echocardiogram>> EF 60-65%, Grade II Diastolic Dysfunction 53/2 - Venous US LUE>>Examination is positive for age-indeterminate short segment occlusive DVT involving the mid humeral aspect of one of the paired brachial  veins. There is no extension of this short-segment occlusive DVT to the more proximal venous system of the left upper extremity. 12/10 - MR Brain w/o Contrast >>1. No acute intracranial abnormality. 2. Mild chronic small vessel disease. 12/15 - CT Cervical Spine>> No evidence of discitis/osteomyelitis or collection. 12/15 - CT Thoracic Spine>>No evidence of discitis/osteomyelitis in the thoracic spine. No collection. Bilateral pleural effusions and bibasilar atelectasis/consolidation. 12/15 - CT Lumbar Spine>> No evidence of discitis/osteomyelitis in the lumbar spine. Nonspecific mild presacral edema. No paraspinal collection 12/16 - DG 2V Left Foot>> Calcaneal spur.  No evidence of osteomyelitis. 12/24 - CT Head wo Contrast>>1. Generalized atrophy and chronic ischemic microangiopathy without acute intracranial abnormality. 12/24 - CT Cervical Spine>> No acute fracture or static subluxation of the cervical spine.  CULTURES: SARS-CoV-2 PCR 12/7>> negative Blood culture 12/7>> MRSA MRSA PCR 12/8>> positive Blood culture 12/9>> MRSA Blood cultures 12/11>> MRSA Blood cultures 12/13>> MRSA Wound culture from left heel 12/15>> MRSA Blood cultures 12/16>> no growth to date  ANTIBIOTICS: Ceftaroline 12/14>> 12/22 Daptomycin 12/14>>12/28 Vancomycin 12/24>>  SIGNIFICANT EVENTS: 12/7>> admission to MedSurg 12/8>> transfer to stepdown due to hypotension requiring vasopressors; PCCM and infectious disease consulted 99/24>> fall, no complications noted 26/83>> PEG tube placement, returns to ICU, remains intubated  LINES/TUBES: Right Femoral CVC 12/8>>12/14 Left IJ Tunneled HD Catheter Left Femoral CVC 12/19>> PEG tube placement 12/29>> ETT 12/29>>  ASSESSMENT / PLAN:  Post operative respiratory failure -patient was unable to be weaned off mechanical ventilation after PEG tube -patient is now s/p vascular procedure -patient has been on spontaneous breathing trial today for 2 hours -  doing ok but mentation is lethargic GCS8T -keep on vent with PS 20/PEEP until tommorow -extubated today    Post anesthesia/post operative transient hypotension  - s/p vasopressor support   Chronic HFpEF Paroxsymal Atrial Fibrillation ~ currently in NSR Hx: HTN, CAD s/p stent, Chronic HFpEF P:  Continuous cardiac monitoring Maintain MAP >60 (given she is on Hemodialysis) Levophed if needed to maintain MAP goal Continue Midodrine Cautious IV Fluids given ESRD on HD Volume removal with HD Heparin drip for Anticoagulation 2D Echocardiogram 02/10/19>> EF 60-65%, grade II diastolic dysfuntion  RENAL A:   ESRD on HD P:   Monitor I&O's / urinary output Follow  BMP Ensure adequate renal perfusion Avoid nephrotoxic agents as able Replace electrolytes as indicated Nephrology following, appreciate input ~ HD as per Nephrology  GASTROINTESTINAL A:   No acute issues  Hx: GERD P:   NPO for now; Able to start tube feeds on 12/30 following AV graft removal Protonix for SUP PEG placed on 03/03/19 by GI, will follow recommendations  HEMATOLOGIC A:   Left Arm DVT P:  Monitor for S/Sx of bleeding Trend CBC Heparin drip for VTE Prophylaxis/Anticoagulation  Transfuse for Hgb <7   INFECTIOUS A:   Sepsis secondary to MRSA Bacteremia (ID suspects infected AV graft with stent) P:   Monitor fever curve Trend WBC's and Procalcitonin Follow cultures as above On Vancomycin ID following, appreciate input ~ will follow recommendations CT Spine negative, TEE not performed due to encephalopathy, Left heel decubitus ulcer with MRSA (but no osteomyelitis) Plan for AV Graft removal on 12/30  ENDOCRINE A:   DM II  Hypothyoidism P:   CBG's SSI Follow ICU Hypo/hyperglycemia protocol Continue Synthroid  NEUROLOGIC A:   Acute metabolic Encephalopathy P:   RASS goal: 0 to -1 Precedex gtt and Fentanyl pushes to maintain RASS goal Avoid sedating meds as able Daily [facility] MRI Brain  negative EEG negative Ammonia normal TSH normal   FAMILY  - Updates: No family at bedside during NP rounds 03/03/19.  Pt prognosis is poor.  Pt is DNR.  - Inter-disciplinary family meet or Palliative Care currently following.   Critical care provider statement:    Critical care time (minutes):  33   Critical care time was exclusive of:  Separately billable procedures and  treating other patients   Critical care was necessary to treat or prevent imminent or  life-threatening deterioration of the following conditions:  altered mental status with lethrargy, post operative respiratory failure, multiple comorbid conditions   Critical care was time spent personally by me on the following  activities:  Development of treatment plan with patient or surrogate,  discussions with consultants, evaluation of patient's response to  treatment, examination of patient, obtaining history from patient or  surrogate, ordering and performing treatments and interventions, ordering  and review of laboratory studies and re-evaluation of patient's condition   I assumed direction of critical care for this patient from another  provider in my specialty: no      Ottie Glazier, M.D.  Pulmonary & [place]

## 2019-03-05 NOTE — Consult Note (Signed)
ANTICOAGULATION CONSULT NOTE  Pharmacy Consult for heparin Indication: DVT  Patient Measurements: Height: 5' 4.02" (162.6 cm) Weight: 166 lb 7.2 oz (75.5 kg) IBW/kg (Calculated) : 54.74 Heparin Dosing Weight: 71 kg  Vital Signs: Temp: 97.6 F (36.4 C) (12/31 2000) Temp Source: Axillary (12/31 2000) BP: 115/45 (12/31 2000) Pulse Rate: 58 (12/31 2000)  Labs: Recent Labs    03/03/19 0636 03/03/19 2100 03/03/19 2139 03/04/19 0417 03/05/19 0356 03/05/19 2055  HGB 8.9*  --  8.6* 8.1* 8.6*  --   HCT 26.3*  --  26.6* 24.4* 27.2*  --   PLT 172  --  219 196 225  --   HEPARINUNFRC 0.22*  --   --   --   --  0.32  CREATININE 2.44* 2.95*  --  3.14* 2.16*  --    Estimated Creatinine Clearance: 25.8 mL/min (A) (by C-G formula based on SCr of 2.16 mg/dL (H)).  Medical History: Past Medical History:  Diagnosis Date  . Anemia   . Chronic diastolic CHF (congestive heart failure) ([place])    a. Due to ischemic cardiomyopathy. EF as low as 35%, improved to normal s/p CABG; b. echo 07/06/13: EF 55-60%, no RWMAs, mod TR, trivial pericardial effusion not c/w tamponade physiology;  c. 10/2015 Echo: EF 65%, Gr1 DD, triv AI, mild MR, mildly dil LA, mod TR, PASP 1mmHg.  Marland Kitchen Coronary artery disease    a. NSTEMI 06/2013; b.cath: severe three-vessel CAD w/ EF 30% & mild-mod MR; c. s/p 3 vessel CABG 07/02/13 (LIMA-LAD, SVG-OM, and SVG-RPDA);  d. 10/2015 MV: no ischemia/infarct.  . Diabetes mellitus without complication ([place])   . Diabetic neuropathy ([place])   . Dialysis patient [facility])    MWF  . ESRD (end stage renal disease) ([place])    a. 12/2015 initiated - mwf dialysis.  Marland Kitchen GERD (gastroesophageal reflux disease)   . Hyperlipidemia   . Hypertension   . Hypothyroidism   . Myocardial infarction ([place]) 2015  . Neuropathy   . Pleural effusion 2015  . Pulmonary hypertension ([place])   . Renal insufficiency   . Wears dentures    full lower    Medications:  Scheduled:  . aspirin  81 mg Oral Daily  . B-complex  with vitamin C  1 tablet Per Tube Daily  . Chlorhexidine Gluconate Cloth  6 each Topical Q0600  . epoetin (EPOGEN/PROCRIT) injection  4,000 Units Intravenous Q M,W,F-HD  . feeding supplement (PRO-STAT SUGAR FREE 64)  30 mL Per Tube Daily  . fluticasone  2 spray Each Nare Daily  . free water  30 mL Per Tube Q4H  . insulin aspart  0-15 Units Subcutaneous Q4H  . levothyroxine  88 mcg Oral QAC breakfast  . mouth rinse  15 mL Mouth Rinse BID  . midodrine  10 mg Oral TID WC  . pantoprazole (PROTONIX) IV  40 mg Intravenous Q24H  . venlafaxine  75 mg Oral BID WC  . zinc sulfate  220 mg Oral Daily    Assessment: 63 y.o. female admitted on 02/09/2019 with MRSA  bacteremia. She was on apixaban PTA but last dose was prior to admission (12/7). [place] 12/15 revealed age-indeterminate short segment occlusive DVT involving the mid humeral aspect of one of the paired brachial veins.  Heparin was started but after repeated attempts by multiple phlebotomists lab was unable to draw labs required for heparin monitoring. Additionally, she was too confused and disoriented to swallow oral medications. On 12/20 a femoral central line was placed.  Heparin Course: 12/24 0231 HL 0.26: held due to unwitnessed fall and bleeding from arm 12/25 0413 HL 0.28: subtherapeutic - CBC stable.  Will increase Heparin to 900 units/hr and recheck in 8 hours. 12/25 1356 HL 0.47: therapeutic  12/25 2020 HL 0.36: therapeutic x 2 12/26 HL @0626 = 0.16. Will give Heparin bolus of 2100 units and increase heparin drip to 1100 units/hr until INR is ?2 for at least 2 measurements taken ~24 hours apart 12/30  12/26-12/28 HL therapeutic at 1100 units/hour 12/29 Stopped for PEG 12/30 AVG Excision.  Does not apear heparin was restarted.  Goal of Therapy:  Heparin Level: 0.3 - 0.7 units/mL Monitor platelets by anticoagulation protocol: Yes   Plan:  Patient is POD#1 AVG excision 12/30.  Restarting heparin per MD with no bolus.  Will plan to  restart heparin at 1100 units/hour, as patient was therapeutic at this rate prior to procedure.   Pharmacy will monitor Heparin level in 8 hours, and CBC with AM labs.  12/31:  HL @ 2055 = 0.32 Will continue this pt on current rate and recheck HL on 01/01 @ 0500.   Melissa Tomaselli D, PharmD 03/05/2019 9:30 PM

## 2019-03-05 NOTE — Progress Notes (Signed)
Nutrition Follow-up  DOCUMENTATION CODES:   Obesity unspecified  INTERVENTION:  Resume Osmolite 1.5 Cal at 50 mL/hr + Pro-Stat 30 mL daily per tube. Provides 1900 kcal, 90 grams of protein, 912 mL H2O daily.  Continue free water flush of 30 mL Q4hrs to maintain tube patency.  Continue B-complex with C daily per tube.  NUTRITION DIAGNOSIS:   Increased nutrient needs related to chronic illness(ESRD on HD) as evidenced by estimated needs.  Ongoing.  GOAL:   Patient will meet greater than or equal to 90% of their needs  Not met - addressing with initiation of tube feeds.  MONITOR:   Labs, Weight trends, TF tolerance, Skin, I & O's  REASON FOR ASSESSMENT:   Consult Enteral/tube feeding initiation and management  ASSESSMENT:   63 y.o. female with end-stage renal disease on HD, atrial fibrillation, chronic diastolic congestive heart failure, CAD status post PCI, CABG,  type 2 diabetes, hypertension, hyperlipidemia, hypothyroidism, Covid positive in July 2020 admitted with AMS and found to have MRSA positive blood cultures  Patient is s/p placement of 20 Fr. EndoVive Safey G-tube on 12/29 by GI. She transferred back to the ICU and remained on the vent after procedure. She underwent removal of right arm AV graft yesterday and remained on vent. Today patient was able to be extubated. Discussed on rounds. Plan is to resume tube feeds today.  Medications reviewed and include: B-complex with C 1 tablet daily, Epogen 4000 units during HD, free water flush 30 mL Q4hrs, Novolog 0-15 units Q4hrs, levothyroxine, pantoprazole, zinc sulfate 220 mg daily, Precedex gtt, levophed gtt now off, heparin gtt, vancomycin during HD.  Labs reviewed: CBG 68-116, Creatinine 2.16.  Enteral Access: PEG  Diet Order:   Diet Order            Diet NPO time specified  Diet effective now             EDUCATION NEEDS:   Not appropriate for education at this time  Skin:  Skin Assessment: Skin  Integrity Issues:(stg II right buttocks; DTI right heel; unstageable left heel)  Last BM:  03/05/2019 - type 7  Height:   Ht Readings from Last 1 Encounters:  03/04/19 5' 4.02" (1.626 m)   Weight:   Wt Readings from Last 1 Encounters:  03/02/19 75.5 kg   Ideal Body Weight:  45 kg  BMI:  Body mass index is 28.56 kg/m.  Estimated Nutritional Needs:   Kcal:  1700-1900kcal/day  Protein:  85-95g/day  Fluid:  UOP +1L  Jacklynn Barnacle, MS, RD, LDN Office: [phone] Pager: [phone] After Hours/Weekend Pager: [phone]

## 2019-03-05 NOTE — Progress Notes (Signed)
ID Extubated Awake , tries to talk No distress Husband at bed side  BP (!) 110/42   Pulse (!) 56   Temp 97.8 F (36.6 C) (Axillary)   Resp 20   Ht 5' 4.02" (1.626 m)   Wt 75.5 kg   SpO2 96%   BMI 28.56 kg/m     Chest b/l air entry HS s1s2 Abd soft- peg in place CNS -encephalopathic Left femoral line Left IJ   CBC Latest Ref Rng & Units 03/05/2019 03/04/2019 03/03/2019  WBC 4.0 - 10.5 K/uL 9.7 6.7 10.0  Hemoglobin 12.0 - 15.0 g/dL 8.6(L) 8.1(L) 8.6(L)  Hematocrit 36.0 - 46.0 % 27.2(L) 24.4(L) 26.6(L)  Platelets 150 - 400 K/uL 225 196 219    CMP Latest Ref Rng & Units 03/05/2019 03/04/2019 03/03/2019  Glucose 70 - 99 mg/dL 102(H) 136(H) 74  BUN 8 - 23 mg/dL 14 24(H) 22  Creatinine 0.44 - 1.00 mg/dL 2.16(H) 3.14(H) 2.95(H)  Sodium 135 - 145 mmol/L 137 138 138  Potassium 3.5 - 5.1 mmol/L 3.6 4.2 4.0  Chloride 98 - 111 mmol/L 99 101 100  CO2 22 - 32 mmol/L 26 28 28   Calcium 8.9 - 10.3 mg/dL 8.5(L) 8.5(L) 8.4(L)  Total Protein 6.5 - 8.1 g/dL 5.6(L) - -  Total Bilirubin 0.3 - 1.2 mg/dL 0.8 - -  Alkaline Phos 38 - 126 U/L 88 - -  AST 15 - 41 U/L 25 - -  ALT 0 - 44 U/L 16 - -   Impression/Recommendation  MRSA bacteremia- was persistent the first week inspite of vanco MIC being 1 consistently- she was on dual MRSA therapy with dapto and ceftaroline for 10 days and now on vancomycin  the last culture from 12/16 has been negative AV graft thought to be the site of infection was removed on 03/04/19. Wound vac in place On IV vancomycin - for 6 weeks -end date 04/01/19- during the days of dialysis  Encephalopathy - unclear- She is intermittently verbal ( mono syllable) but does not eat . Has peg  Long COVID- post COVID morbidity  ESRD on dialysis  ID will follow her peripherally this weekend- call if needed

## 2019-03-05 NOTE — TOC Progression Note (Signed)
Transition of Care [facility]) - Progression Note    Patient Details  Name: Jamie Arias MRN: [id number] Date of Birth: [birth year]  Transition of Care [facility]) CM/SW [place], RN Phone Number: 03/05/2019, 2:24 PM  Clinical Narrative:     Damaris Schooner to Doroteo Bradford with [facility], She will review the patient to see if she would be appropriate for LTAC and let me know  Expected Discharge Plan: [place] Barriers to Discharge: Continued Medical Work up  Expected Discharge Plan and Services Expected Discharge Plan: [place] In-house Referral: Clinical Social Work     Living arrangements for the past 2 months: Mobile Home                                       Social Determinants of Health (SDOH) Interventions    Readmission Risk Interventions Readmission Risk Prevention Plan 02/13/2019 09/15/2018  Transportation Screening - Complete  Medication Review Press photographer) Complete Complete  PCP or Specialist appointment within 3-5 days of discharge - Complete  HRI or Home Care Consult Complete Complete  SW Recovery Care/Counseling Consult Complete Complete  [place] Not Applicable Complete  Some recent data might be hidden

## 2019-03-05 NOTE — Consult Note (Signed)
ANTICOAGULATION CONSULT NOTE  Pharmacy Consult for heparin Indication: DVT  Patient Measurements: Height: 5' 4.02" (162.6 cm) Weight: 166 lb 7.2 oz (75.5 kg) IBW/kg (Calculated) : 54.74 Heparin Dosing Weight: 71 kg  Vital Signs: Temp: 97.8 F (36.6 C) (12/31 0800) Temp Source: Axillary (12/31 0800) BP: 110/42 (12/31 0900) Pulse Rate: 56 (12/31 0900)  Labs: Recent Labs    03/03/19 0636 03/03/19 2100 03/03/19 2139 03/04/19 0417 03/05/19 0356  HGB 8.9*  --  8.6* 8.1* 8.6*  HCT 26.3*  --  26.6* 24.4* 27.2*  PLT 172  --  219 196 225  HEPARINUNFRC 0.22*  --   --   --   --   CREATININE 2.44* 2.95*  --  3.14* 2.16*   Estimated Creatinine Clearance: 25.8 mL/min (A) (by C-G formula based on SCr of 2.16 mg/dL (H)).  Medical History: Past Medical History:  Diagnosis Date  . Anemia   . Chronic diastolic CHF (congestive heart failure) ([place])    a. Due to ischemic cardiomyopathy. EF as low as 35%, improved to normal s/p CABG; b. echo 07/06/13: EF 55-60%, no RWMAs, mod TR, trivial pericardial effusion not c/w tamponade physiology;  c. 10/2015 Echo: EF 65%, Gr1 DD, triv AI, mild MR, mildly dil LA, mod TR, PASP 52mHg.  .Marland KitchenCoronary artery disease    a. NSTEMI 06/2013; b.cath: severe three-vessel CAD w/ EF 30% & mild-mod MR; c. s/p 3 vessel CABG 07/02/13 (LIMA-LAD, SVG-OM, and SVG-RPDA);  d. 10/2015 MV: no ischemia/infarct.  . Diabetes mellitus without complication ([place]   . Diabetic neuropathy ([place]   . Dialysis patient ([facility]    MWF  . ESRD (end stage renal disease) ([place]    a. 12/2015 initiated - mwf dialysis.  .Marland KitchenGERD (gastroesophageal reflux disease)   . Hyperlipidemia   . Hypertension   . Hypothyroidism   . Myocardial infarction ([place] 2015  . Neuropathy   . Pleural effusion 2015  . Pulmonary hypertension ([place]   . Renal insufficiency   . Wears dentures    full lower    Medications:  Scheduled:  . aspirin  81 mg Oral Daily  . B-complex with vitamin C  1 tablet Per Tube Daily   . chlorhexidine gluconate (MEDLINE KIT)  15 mL Mouth Rinse BID  . Chlorhexidine Gluconate Cloth  6 each Topical Q0600  . epoetin (EPOGEN/PROCRIT) injection  4,000 Units Intravenous Q M,W,F-HD  . fluticasone  2 spray Each Nare Daily  . free water  30 mL Per Tube Q4H  . insulin aspart  0-15 Units Subcutaneous Q4H  . levothyroxine  88 mcg Oral QAC breakfast  . mouth rinse  15 mL Mouth Rinse q12n4p  . mouth rinse  15 mL Mouth Rinse 10 times per day  . midodrine  10 mg Oral TID WC  . pantoprazole (PROTONIX) IV  40 mg Intravenous Q24H  . venlafaxine  75 mg Oral BID WC  . zinc sulfate  220 mg Oral Daily    Assessment: 63y.o. female admitted on 02/09/2019 with MRSA  bacteremia. She was on apixaban PTA but last dose was prior to admission (12/7). [place]12/15 revealed age-indeterminate short segment occlusive DVT involving the mid humeral aspect of one of the paired brachial veins.  Heparin was started but after repeated attempts by multiple phlebotomists lab was unable to draw labs required for heparin monitoring. Additionally, she was too confused and disoriented to swallow oral medications. On 12/20 a femoral central line was placed.    Heparin Course: 12/24  0231 HL 0.26: held due to unwitnessed fall and bleeding from arm 12/25 0413 HL 0.28: subtherapeutic - CBC stable.  Will increase Heparin to 900 units/hr and recheck in 8 hours. 12/25 1356 HL 0.47: therapeutic  12/25 2020 HL 0.36: therapeutic x 2 12/26 HL _0 = 0.16. Will give Heparin bolus of 2100 units and increase heparin drip to 1100 units/hr until INR is ?2 for at least 2 measurements taken ~24 hours apart 12/30  12/26-12/28 HL therapeutic at 1100 units/hour 12/29 Stopped for PEG 12/30 AVG Excision.  Does not apear heparin was restarted.  Goal of Therapy:  Heparin Level: 0.3 - 0.7 units/mL Monitor platelets by anticoagulation protocol: Yes   Plan:  Patient is POD#1 AVG excision 12/30.  Restarting heparin per MD with no bolus.   Will plan to restart heparin at 1100 units/hour, as patient was therapeutic at this rate prior to procedure.   Pharmacy will monitor Heparin level in 8 hours, and CBC with AM labs.  Gerald Dexter, PharmD Pharmacy Resident  03/05/2019 11:36 AM

## 2019-03-05 NOTE — TOC Progression Note (Signed)
Transition of Care [facility]) - Progression Note    Patient Details  Name: Jamie Arias MRN: [id number] Date of Birth: Jul 20, 1955  Transition of Care [facility]) CM/SW [place], RN Phone Number: 03/05/2019, 2:39 PM  Clinical Narrative:    Spoke with [facility] and the husband has already spoke with LTAC, they do not have a bed available yet but she is on their waiting list   Expected Discharge Plan: [place] Barriers to Discharge: Continued Medical Work up  Expected Discharge Plan and Services Expected Discharge Plan: [place] In-house Referral: Clinical Social Work     Living arrangements for the past 2 months: Mobile Home                                       Social Determinants of Health (SDOH) Interventions    Readmission Risk Interventions Readmission Risk Prevention Plan 02/13/2019 09/15/2018  Transportation Screening - Complete  Medication Review Press photographer) Complete Complete  PCP or Specialist appointment within 3-5 days of discharge - Complete  HRI or Home Care Consult Complete Complete  SW Recovery Care/Counseling Consult Complete Complete  [place] Not Applicable Complete  Some recent data might be hidden

## 2019-03-05 NOTE — Progress Notes (Signed)
Daily Progress Note   Patient Name: Jamie Arias       Date: 03/05/2019 DOB: April 01, 1955  Age: 63 y.o. MRN#: [id number] Attending Physician: Loletha Grayer, MD Primary Care Physician: [name], MD Admit Date: 02/09/2019  Reason for Consultation/Follow-up: Establishing goals of care  Subjective: Patient extubated this morning. No distress noted. She does not respond to voice or touch. Called to speak with husband. He states these 2 procedures back to back 2 days in a row took a lot out of her, and he has made up his mind that he does not want any further surgeries or procedures for her. He states he was asked about reintubation from a doctor yesterday and was not sure; he states he knew "I would not want to try to bring her back". He states he knows she would not want a tracheostomy and he would not want her to have one. He states she has been through a lot, and if she developed respiratory distress, he would want to focus on her comfort until end of life and not reintubate her.   Length of Stay: 24  Current Medications: Scheduled Meds:  . aspirin  81 mg Oral Daily  . B-complex with vitamin C  1 tablet Per Tube Daily  . chlorhexidine gluconate (MEDLINE KIT)  15 mL Mouth Rinse BID  . Chlorhexidine Gluconate Cloth  6 each Topical Q0600  . epoetin (EPOGEN/PROCRIT) injection  4,000 Units Intravenous Q M,W,F-HD  . fluticasone  2 spray Each Nare Daily  . free water  30 mL Per Tube Q4H  . insulin aspart  0-15 Units Subcutaneous Q4H  . levothyroxine  88 mcg Oral QAC breakfast  . mouth rinse  15 mL Mouth Rinse q12n4p  . mouth rinse  15 mL Mouth Rinse 10 times per day  . midodrine  10 mg Oral TID WC  . pantoprazole (PROTONIX) IV  40 mg Intravenous Q24H  . venlafaxine  75 mg Oral BID WC   . zinc sulfate  220 mg Oral Daily    Continuous Infusions: . sodium chloride Stopped (03/01/19 0429)  . sodium chloride    . dexmedetomidine (PRECEDEX) IV infusion 0.6 mcg/kg/hr (03/05/19 1018)  . norepinephrine (LEVOPHED) Adult infusion 1 mcg/min (03/05/19 0347)  . vancomycin Stopped (03/02/19 1752)    PRN Meds: sodium chloride,  sodium chloride, fentaNYL (SUBLIMAZE) injection, fentaNYL (SUBLIMAZE) injection, haloperidol lactate, ipratropium-albuterol, ondansetron (ZOFRAN) IV  Physical Exam Constitutional:      Comments: Eyes closed.   Pulmonary:     Effort: Pulmonary effort is normal.             Vital Signs: BP (!) 110/42   Pulse (!) 56   Temp 97.8 F (36.6 C) (Axillary)   Resp 20   Ht 5' 4.02" (1.626 m)   Wt 75.5 kg   SpO2 96%   BMI 28.56 kg/m  SpO2: SpO2: 96 % O2 Device: O2 Device: Ventilator O2 Flow Rate: O2 Flow Rate (L/min): 2 L/min  Intake/output summary:   Intake/Output Summary (Last 24 hours) at 03/05/2019 1041 Last data filed at 03/05/2019 0900 Gross per 24 hour  Intake 1619.72 ml  Output 1110 ml  Net 509.72 ml   LBM: Last BM Date: 03/04/19 Baseline Weight: Weight: 59 kg Most recent weight: Weight: 75.5 kg       Palliative Assessment/Data:       Patient Active Problem List   Diagnosis Date Noted  . AF (paroxysmal atrial fibrillation) ([place])   . Diarrhea   . Oropharyngeal dysphagia   . Acute metabolic encephalopathy   . Acute blood loss anemia   . Arm DVT (deep venous thromboembolism), acute, left ([place]) 02/13/2019  . Acute encephalopathy 02/13/2019  . Decubitus ulcer of heel, bilateral, stage 2 ([place]) 02/10/2019  . Sepsis due to methicillin resistant Staphylococcus aureus (MRSA) ([place]) 02/10/2019  . MRSA bacteremia 02/10/2019  . Altered level of consciousness 02/09/2019  . Adjustment disorder with depressed mood 08/31/2018  . COVID-19 virus infection 08/24/2018  . Acute on chronic respiratory failure with hypoxia ([place]) 08/24/2018  . UTI  (urinary tract infection) 08/24/2018  . Allergic rhinitis 09/19/2017  . Uremia of renal origin 09/04/2017  . Chronic neck pain 07/25/2017  . Hyperkalemia 02/15/2017  . Complication from renal dialysis device 12/01/2016  . ESRD (end stage renal disease) ([place]) 01/19/2016  . GI bleed 10/25/2015  . Hypertensive heart disease 10/24/2015  . Chronic diastolic CHF (congestive heart failure) ([place]) 10/24/2015  . Unstable angina ([place]) 10/23/2015  . Hypotension 09/02/2015  . Chronic systolic CHF (congestive heart failure) ([place]) 09/02/2015  . Depression 07/27/2015  . Calculus of gallbladder with chronic cholecystitis without obstruction   . Bilateral carotid bruits 11/30/2014  . Low magnesium levels 08/10/2013  . Anemia 08/10/2013  . Constipation 08/10/2013  . Atrial fibrillation, chronic ([place]) 07/30/2013  . Coronary artery disease   . CAD (coronary artery disease) 07/02/2013  . Acute systolic heart failure ([place]) 06/30/2013  . NSTEMI (non-ST elevated myocardial infarction) ([place]) 06/29/2013  . Vertigo 08/25/2012  . Sleep disorder 05/23/2012  . Hypothyroid 04/27/2012  . HTN (hypertension) 04/27/2012  . HLD (hyperlipidemia) 04/27/2012  . Type 2 diabetes mellitus with ESRD (end-stage renal disease) ([place]) 04/27/2012  . Neuropathy 04/27/2012    Palliative Care Assessment & Plan    Recommendations/Plan:  DNR/DNI per husband at this time.  Code Status:    Code Status Orders  (From admission, onward)         Start     Ordered   02/21/19 0930  Do not attempt resuscitation (DNR)  Continuous    Question Answer Comment  In the event of cardiac or respiratory ARREST Do not call a "code blue"   In the event of cardiac or respiratory ARREST Do not perform Intubation, CPR, defibrillation or ACLS   In the event of cardiac or respiratory  ARREST Use medication by any route, position, wound care, and other measures to relive pain and suffering. May use oxygen, suction and manual treatment of  airway obstruction as needed for comfort.      02/21/19 0932        Code Status History    Date Active Date Inactive Code Status Order ID Comments User Context   02/09/2019 0677 02/21/2019 0932 Full Code [id number]  Thornell Mule, MD ED   08/24/2018 0022 09/15/2018 1720 Full Code [id number]  Shela Leff, MD Inpatient   09/04/2017 1510 09/06/2017 1920 Full Code [id number]  Epifanio Lesches, MD ED   02/15/2017 1243 02/16/2017 2023 Full Code [id number]  Saundra Shelling, MD Inpatient   10/23/2015 0828 10/23/2015 0908 Full Code [id number]  Saundra Shelling, MD Inpatient   09/03/2015 0130 09/03/2015 1619 Full Code [id number]  Lance Coon, MD Inpatient   07/02/2013 1327 07/14/2013 1540 Full Code [id number]  Nani Skillern, PA-C Inpatient   06/29/2013 2153 07/02/2013 1327 Full Code [id number]  Larey Dresser, MD Inpatient   Advance Care Planning Activity       Prognosis:  Poor overall   Thank you for allowing the Palliative Medicine Team to assist in the care of this patient.   Total Time 25 min Prolonged Time Billed  no      Greater than 50%  of this time was spent counseling and coordinating care related to the above assessment and plan.  Asencion Gowda, NP  Please contact Palliative Medicine Team phone at [phone] for questions and concerns.

## 2019-03-05 NOTE — Progress Notes (Addendum)
[facility]  ROUNDING NOTE   Subjective:   Patient is critically ill Patient underwent AVG excision yesterday   Patient is continued on ventilator support.  FiO2 30%   Objective:  Vital signs in last 24 hours:  Temp:  [97.7 F (36.5 C)-98.7 F (37.1 C)] 97.7 F (36.5 C) (12/31 0400) Pulse Rate:  [48-92] 65 (12/31 0700) Resp:  [0-21] 0 (12/31 0700) BP: (92-159)/(47-132) 120/53 (12/31 0700) SpO2:  [96 %-100 %] 98 % (12/31 0821) FiO2 (%):  [30 %-40 %] 30 % (12/31 0821)  Weight change:  Filed Weights   02/25/19 0842 02/25/19 1200 03/02/19 0930  Weight: 74.1 kg 73.2 kg 75.5 kg    Intake/Output: I/O last 3 completed shifts: In: 1942.9 [I.V.:1682.9; Other:180; NG/GT:30; IV Piggyback:50] Out: 1191 [Drains:100; Other:500; Stool:900; Blood:10]   Intake/Output this shift:  No intake/output data recorded.  Physical Exam: General: NAD, laying in bed  HEENT  ET tube,   Lungs:   Ventilator assisted  Heart: Regular rate and rhythm  Abdomen:  Soft, nontender, PEG tube in place  Extremities: + peripheral edema b/l, feet in b/l soft support  Neurologic:  Sedated  Access:  Left IJ PC  Rectal rube in place Rt arm wound vac  Basic Metabolic Panel: Recent Labs  Lab 03/01/19 0429 03/01/19 0429 03/01/19 2014 03/02/19 0329 03/03/19 0636 03/03/19 2100 03/04/19 0417 03/05/19 0356  NA 135  --   --  134* 139 138 138 137  K 4.1   < > 4.2 4.4 4.0 4.0 4.2 3.6  CL 96*  --   --  96* 101 100 101 99  CO2 26  --   --  _0 GLUCOSE 256*  --   --  130* 125* 74 136* 102*  BUN 21  --   --  28* 19 22 24* 14  CREATININE 3.03*  --   --  3.61* 2.44* 2.95* 3.14* 2.16*  CALCIUM 8.2*  --   --  8.3* 8.3* 8.4* 8.5* 8.5*  MG 1.9  --   --  1.9 2.0  --  2.1 1.7  PHOS 1.9*  --  3.2 2.9 1.8*  --  3.7 2.8   < > = values in this interval not displayed.    Liver Function Tests: Recent Labs  Lab 02/27/19 0413 02/28/19 0626 03/01/19 0429  AST _1 ALT _2 ALKPHOS 59 63 58  BILITOT 1.7* 1.0 0.9  PROT 5.0* 4.9* 4.6*  ALBUMIN 2.1* 2.0* 1.8*   No results for input(s): LIPASE, AMYLASE in the last 168 hours. No results for input(s): AMMONIA in the last 168 hours.  CBC: Recent Labs  Lab 03/02/19 0329 03/03/19 0636 03/03/19 2139 03/04/19 0417 03/05/19 0356  WBC 10.4 10.0 10.0 6.7 9.7  HGB 8.2* 8.9* 8.6* 8.1* 8.6*  HCT 24.1* 26.3* 26.6* 24.4* 27.2*  MCV 87.3 88.3 93.7 89.7 94.4  PLT 197 172 219 196 225    Cardiac Enzymes: Recent Labs  Lab 03/02/19 0330  CKTOTAL 31*    BNP: Invalid input(s): POCBNP  CBG: Recent Labs  Lab 03/04/19 1923 03/04/19 2356 03/05/19 0341 03/05/19 0739 03/05/19 0829  GLUCAP 164* 131* 83 68* 60    Microbiology: Results for orders placed or performed during the hospital encounter of 02/09/19  Culture, blood (routine x 2)     Status: Abnormal   Collection Time: 02/09/19  2:29 PM   Specimen: BLOOD LEFT ARM  Result Value Ref  Range Status   Specimen Description   Final    BLOOD LEFT ARM Performed at [facility], [address], [place], [place] [address]    Special Requests   Final    BOTTLES DRAWN AEROBIC AND ANAEROBIC Blood Culture adequate volume Performed at [facility], [address] [place], [place] [address]    Culture  Setup Time   Final    GRAM POSITIVE COCCI IN BOTH AEROBIC AND ANAEROBIC BOTTLES CRITICAL VALUE NOTED.  VALUE IS CONSISTENT WITH PREVIOUSLY REPORTED AND CALLED VALUE. Performed at [facility], [address] [place], [place] [address]    Culture (A)  Final    STAPHYLOCOCCUS AUREUS SUSCEPTIBILITIES PERFORMED ON PREVIOUS CULTURE WITHIN THE LAST 5 DAYS. Performed at [facility], [place] [address] [place], [place] [address]    Report Status 02/12/2019 FINAL  Final  Culture, blood (routine x 2)     Status: Abnormal   Collection Time: 02/09/19  2:38 PM   Specimen: BLOOD LEFT ARM  Result Value Ref Range Status   Specimen  Description   Final    BLOOD LEFT ARM Performed at [facility], [address], [place], [place] [address]    Special Requests   Final    BOTTLES DRAWN AEROBIC AND ANAEROBIC Blood Culture adequate volume Performed at [facility], [address], [place], [place] [address]    Culture  Setup Time   Final    GRAM POSITIVE COCCI IN BOTH AEROBIC AND ANAEROBIC BOTTLES CRITICAL RESULT CALLED TO, READ BACK BY AND VERIFIED WITH: [place] 6546 02/10/2019 HNM Performed at [facility], [place] [address] [place], [place] [address]    Culture METHICILLIN RESISTANT STAPHYLOCOCCUS AUREUS (A)  Final   Report Status 02/12/2019 FINAL  Final   Organism ID, Bacteria METHICILLIN RESISTANT STAPHYLOCOCCUS AUREUS  Final      Susceptibility   Methicillin resistant staphylococcus aureus - MIC*    CIPROFLOXACIN >=8 RESISTANT Resistant     ERYTHROMYCIN >=8 RESISTANT Resistant     GENTAMICIN <=0.5 SENSITIVE Sensitive     OXACILLIN >=4 RESISTANT Resistant     TETRACYCLINE <=1 SENSITIVE Sensitive     VANCOMYCIN 1 SENSITIVE Sensitive     TRIMETH/SULFA <=10 SENSITIVE Sensitive     CLINDAMYCIN RESISTANT Resistant     RIFAMPIN <=0.5 SENSITIVE Sensitive     Inducible Clindamycin POSITIVE Resistant     * METHICILLIN RESISTANT STAPHYLOCOCCUS AUREUS  Blood Culture ID Panel (Reflexed)     Status: Abnormal   Collection Time: 02/09/19  2:38 PM  Result Value Ref Range Status   Enterococcus species NOT DETECTED NOT DETECTED Final   Listeria monocytogenes NOT DETECTED NOT DETECTED Final   Staphylococcus species DETECTED (A) NOT DETECTED Final    Comment: CRITICAL RESULT CALLED TO, READ BACK BY AND VERIFIED WITH: Hart Robinsons PHARMD [id number] 02/10/2019 HNM    Staphylococcus aureus (BCID) DETECTED (A) NOT DETECTED Final    Comment: Methicillin (oxacillin)-resistant Staphylococcus aureus (MRSA). MRSA is predictably resistant to beta-lactam antibiotics (except ceftaroline). Preferred therapy is  vancomycin unless clinically contraindicated. Patient requires contact precautions if  hospitalized. CRITICAL RESULT CALLED TO, READ BACK BY AND VERIFIED WITH: Hart Robinsons PHARMD [id number] 02/10/2019 HNM    Methicillin resistance DETECTED (A) NOT DETECTED Final    Comment: CRITICAL RESULT CALLED TO, READ BACK BY AND VERIFIED WITH: Hart Robinsons PHARMD 1700 02/10/2019 HNM    Streptococcus species NOT DETECTED NOT DETECTED Final   Streptococcus agalactiae NOT  DETECTED NOT DETECTED Final   Streptococcus pneumoniae NOT DETECTED NOT DETECTED Final   Streptococcus pyogenes NOT DETECTED NOT DETECTED Final   Acinetobacter baumannii NOT DETECTED NOT DETECTED Final   Enterobacteriaceae species NOT DETECTED NOT DETECTED Final   Enterobacter cloacae complex NOT DETECTED NOT DETECTED Final   Escherichia coli NOT DETECTED NOT DETECTED Final   Klebsiella oxytoca NOT DETECTED NOT DETECTED Final   Klebsiella pneumoniae NOT DETECTED NOT DETECTED Final   Proteus species NOT DETECTED NOT DETECTED Final   Serratia marcescens NOT DETECTED NOT DETECTED Final   Haemophilus influenzae NOT DETECTED NOT DETECTED Final   Neisseria meningitidis NOT DETECTED NOT DETECTED Final   Pseudomonas aeruginosa NOT DETECTED NOT DETECTED Final   Candida albicans NOT DETECTED NOT DETECTED Final   Candida glabrata NOT DETECTED NOT DETECTED Final   Candida krusei NOT DETECTED NOT DETECTED Final   Candida parapsilosis NOT DETECTED NOT DETECTED Final   Candida tropicalis NOT DETECTED NOT DETECTED Final    Comment: Performed at [facility], [place], [place] [address]  SARS CORONAVIRUS 2 (TAT 6-24 HRS) Nasopharyngeal Nasopharyngeal Swab     Status: None   Collection Time: 02/09/19  2:54 PM   Specimen: Nasopharyngeal Swab  Result Value Ref Range Status   SARS Coronavirus 2 NEGATIVE NEGATIVE Final    Comment: (NOTE) SARS-CoV-2 target nucleic acids are NOT DETECTED. The SARS-CoV-2 RNA is generally detectable in  upper and lower respiratory specimens during the acute phase of infection. Negative results do not preclude SARS-CoV-2 infection, do not rule out co-infections with other pathogens, and should not be used as the sole basis for treatment or other patient management decisions. Negative results must be combined with clinical observations, patient history, and epidemiological information. The expected result is Negative. Fact Sheet for Patients: SugarRoll.be Fact Sheet for Healthcare Providers: https://www.woods-mathews.com/ This test is not yet approved or cleared by the [place] FDA and  has been authorized for detection and/or diagnosis of SARS-CoV-2 by FDA under an Emergency Use Authorization (EUA). This EUA will remain  in effect (meaning this test can be used) for the duration of the COVID-19 declaration under Section 56 4(b)(1) of the Act, 21 U.S.C. section 360bbb-3(b)(1), unless the authorization is terminated or revoked sooner. Performed at [facility], [place] [address] [place], [place] [address]   MRSA PCR Screening     Status: Abnormal   Collection Time: 02/10/19  5:03 PM   Specimen: Nasopharyngeal  Result Value Ref Range Status   MRSA by PCR POSITIVE (A) NEGATIVE Final    Comment:        The GeneXpert MRSA Assay (FDA approved for NASAL specimens only), is one component of a comprehensive MRSA colonization surveillance program. It is not intended to diagnose MRSA infection nor to guide or monitor treatment for MRSA infections. RESULT CALLED TO, READ BACK BY AND VERIFIED WITH: ALEKSEY FRASER _0  02/10/19 MJU Performed at [facility], [address] [place], [place] [address]   Culture, blood (routine x 2)     Status: Abnormal   Collection Time: 02/11/19 12:31 PM   Specimen: BLOOD  Result Value Ref Range Status   Specimen Description   Final    BLOOD PORTA CATH Performed at [facility] [address], [place], [place] [address]    Special Requests   Final    BOTTLES DRAWN AEROBIC AND ANAEROBIC Blood Culture adequate volume Performed at [facility], [place],  [place] [address]    Culture  Setup Time   Final    GRAM POSITIVE COCCI ANAEROBIC BOTTLE ONLY CRITICAL RESULT CALLED TO, READ BACK BY AND VERIFIED WITH: SCOTT HALL AT 3154 02/12/2019 [facility]  Performed at [facility], [address] [place], [place] [address]    Culture (A)  Final    STAPHYLOCOCCUS AUREUS SUSCEPTIBILITIES PERFORMED ON PREVIOUS CULTURE WITHIN THE LAST 5 DAYS. Performed at [facility], [place] [address], [place], [place] [address]    Report Status 02/13/2019 FINAL  Final  Culture, blood (routine x 2)     Status: None   Collection Time: 02/11/19  3:57 PM   Specimen: BLOOD  Result Value Ref Range Status   Specimen Description BLOOD PORTA CATH  Final   Special Requests   Final    BOTTLES DRAWN AEROBIC AND ANAEROBIC Blood Culture adequate volume   Culture   Final    NO GROWTH 5 DAYS Performed at [facility], [address] [place], [place] [address]    Report Status 02/16/2019 FINAL  Final  CULTURE, BLOOD (ROUTINE X 2) w Reflex to ID Panel     Status: Abnormal   Collection Time: 02/13/19  2:18 PM   Specimen: BLOOD LEFT HAND  Result Value Ref Range Status   Specimen Description BLOOD LEFT HAND  Final   Special Requests   Final    BOTTLES DRAWN AEROBIC AND ANAEROBIC Blood Culture results may not be optimal due to an inadequate volume of blood received in culture bottles   Culture  Setup Time   Final    AEROBIC BOTTLE ONLY GRAM POSITIVE COCCI IN CLUSTERS CRITICAL RESULT CALLED TO, READ BACK BY AND VERIFIED WITH: Herbert Pun AT 1245 ON 02/14/2019 [place]. Performed at [facility], [place] [address], [place], [place] [address]    Culture METHICILLIN RESISTANT STAPHYLOCOCCUS AUREUS (A)  Final   Report Status 02/16/2019 FINAL  Final   Organism  ID, Bacteria METHICILLIN RESISTANT STAPHYLOCOCCUS AUREUS  Final      Susceptibility   Methicillin resistant staphylococcus aureus - MIC*    CIPROFLOXACIN >=8 RESISTANT Resistant     ERYTHROMYCIN >=8 RESISTANT Resistant     GENTAMICIN <=0.5 SENSITIVE Sensitive     OXACILLIN >=4 RESISTANT Resistant     TETRACYCLINE <=1 SENSITIVE Sensitive     VANCOMYCIN <=0.5 SENSITIVE Sensitive     TRIMETH/SULFA <=10 SENSITIVE Sensitive     CLINDAMYCIN RESISTANT Resistant     RIFAMPIN <=0.5 SENSITIVE Sensitive     Inducible Clindamycin POSITIVE Resistant     * METHICILLIN RESISTANT STAPHYLOCOCCUS AUREUS  Blood Culture ID Panel (Reflexed)     Status: Abnormal   Collection Time: 02/13/19  2:18 PM  Result Value Ref Range Status   Enterococcus species NOT DETECTED NOT DETECTED Final   Listeria monocytogenes NOT DETECTED NOT DETECTED Final   Staphylococcus species DETECTED (A) NOT DETECTED Final    Comment: CRITICAL RESULT CALLED TO, READ BACK BY AND VERIFIED WITH: ABBEY ELLINGTON AT 3382 ON 02/14/2019 [place].    Staphylococcus aureus (BCID) DETECTED (A) NOT DETECTED Final    Comment: Methicillin (oxacillin)-resistant Staphylococcus aureus (MRSA). MRSA is predictably resistant to beta-lactam antibiotics (except ceftaroline). Preferred therapy is vancomycin unless clinically contraindicated. Patient requires contact precautions if  hospitalized. CRITICAL RESULT CALLED TO, READ BACK BY AND VERIFIED WITH: Herbert Pun AT 5053 ON 02/14/2019 [place].    Methicillin resistance DETECTED (A) NOT DETECTED Final    Comment: CRITICAL RESULT CALLED TO, READ  BACK BY AND VERIFIED WITH: Herbert Pun AT 0370 ON 02/14/2019 [place].    Streptococcus species NOT DETECTED NOT DETECTED Final   Streptococcus agalactiae NOT DETECTED NOT DETECTED Final   Streptococcus pneumoniae NOT DETECTED NOT DETECTED Final   Streptococcus pyogenes NOT DETECTED NOT DETECTED Final   Acinetobacter baumannii NOT DETECTED NOT DETECTED Final    Enterobacteriaceae species NOT DETECTED NOT DETECTED Final   Enterobacter cloacae complex NOT DETECTED NOT DETECTED Final   Escherichia coli NOT DETECTED NOT DETECTED Final   Klebsiella oxytoca NOT DETECTED NOT DETECTED Final   Klebsiella pneumoniae NOT DETECTED NOT DETECTED Final   Proteus species NOT DETECTED NOT DETECTED Final   Serratia marcescens NOT DETECTED NOT DETECTED Final   Haemophilus influenzae NOT DETECTED NOT DETECTED Final   Neisseria meningitidis NOT DETECTED NOT DETECTED Final   Pseudomonas aeruginosa NOT DETECTED NOT DETECTED Final   Candida albicans NOT DETECTED NOT DETECTED Final   Candida glabrata NOT DETECTED NOT DETECTED Final   Candida krusei NOT DETECTED NOT DETECTED Final   Candida parapsilosis NOT DETECTED NOT DETECTED Final   Candida tropicalis NOT DETECTED NOT DETECTED Final    Comment: Performed at [facility], [address] [place], [place] [address]  CULTURE, BLOOD (ROUTINE X 2) w Reflex to ID Panel     Status: None   Collection Time: 02/13/19  3:09 PM   Specimen: BLOOD  Result Value Ref Range Status   Specimen Description BLOOD BLOOD LEFT HAND  Final   Special Requests   Final    BOTTLES DRAWN AEROBIC AND ANAEROBIC Blood Culture adequate volume   Culture   Final    NO GROWTH 5 DAYS Performed at [facility], [address] [place], [place] [address]    Report Status 02/18/2019 FINAL  Final  Culture, blood (routine x 2)     Status: Abnormal   Collection Time: 02/15/19 12:49 PM   Specimen: BLOOD  Result Value Ref Range Status   Specimen Description   Final    BLOOD LT HAND Performed at [facility], [address] [place], [place] [address]    Special Requests   Final    BOTTLES DRAWN AEROBIC AND ANAEROBIC Blood Culture results may not be optimal due to an inadequate volume of blood received in culture bottles Performed at [facility], [address] [place], [place] [address]    Culture  Setup Time    Final    GRAM POSITIVE COCCI IN BOTH AEROBIC AND ANAEROBIC BOTTLES CRITICAL RESULT CALLED TO, READ BACK BY AND VERIFIED WITHEleonore Chiquito AT 4917 02/16/2019 SDR Performed at [facility], [address] [place], [place] [address]    Culture (A)  Final    METHICILLIN RESISTANT STAPHYLOCOCCUS AUREUS STAPHYLOCOCCUS SPECIES (COAGULASE NEGATIVE) THE SIGNIFICANCE OF ISOLATING THIS ORGANISM FROM A SINGLE SET OF BLOOD CULTURES WHEN MULTIPLE SETS ARE DRAWN IS UNCERTAIN. PLEASE NOTIFY THE MICROBIOLOGY DEPARTMENT WITHIN ONE WEEK IF SPECIATION AND SENSITIVITIES ARE REQUIRED. Performed at [facility], [place] [address] [place], [place] [address]    Report Status 02/18/2019 FINAL  Final   Organism ID, Bacteria METHICILLIN RESISTANT STAPHYLOCOCCUS AUREUS  Final      Susceptibility   Methicillin resistant staphylococcus aureus - MIC*    CIPROFLOXACIN >=8 RESISTANT Resistant     ERYTHROMYCIN >=8 RESISTANT Resistant     GENTAMICIN <=0.5 SENSITIVE Sensitive     OXACILLIN >=4 RESISTANT Resistant     TETRACYCLINE <=1 SENSITIVE Sensitive  VANCOMYCIN <=0.5 SENSITIVE Sensitive     TRIMETH/SULFA <=10 SENSITIVE Sensitive     CLINDAMYCIN RESISTANT Resistant     RIFAMPIN <=0.5 SENSITIVE Sensitive     Inducible Clindamycin POSITIVE Resistant     * METHICILLIN RESISTANT STAPHYLOCOCCUS AUREUS  Aerobic Culture (superficial specimen)     Status: None   Collection Time: 02/17/19  1:31 PM   Specimen: Heel; Wound  Result Value Ref Range Status   Specimen Description   Final    HEEL Performed at [facility], [address], [place], [place] [address]    Special Requests   Final    NONE Performed at [facility], [address] [place], [place] [address]    Gram Stain   Final    NO WBC SEEN MODERATE GRAM POSITIVE COCCI IN PAIRS    Culture   Final    ABUNDANT METHICILLIN RESISTANT STAPHYLOCOCCUS AUREUS ABUNDANT DIPHTHEROIDS(CORYNEBACTERIUM SPECIES) Standardized  susceptibility testing for this organism is not available. Performed at [facility], [place] [address], [place], [place] [address]    Report Status 02/19/2019 FINAL  Final   Organism ID, Bacteria METHICILLIN RESISTANT STAPHYLOCOCCUS AUREUS  Final      Susceptibility   Methicillin resistant staphylococcus aureus - MIC*    CIPROFLOXACIN >=8 RESISTANT Resistant     ERYTHROMYCIN >=8 RESISTANT Resistant     GENTAMICIN <=0.5 SENSITIVE Sensitive     OXACILLIN >=4 RESISTANT Resistant     TETRACYCLINE <=1 SENSITIVE Sensitive     VANCOMYCIN 1 SENSITIVE Sensitive     TRIMETH/SULFA <=10 SENSITIVE Sensitive     CLINDAMYCIN RESISTANT Resistant     RIFAMPIN <=0.5 SENSITIVE Sensitive     Inducible Clindamycin POSITIVE Resistant     * ABUNDANT METHICILLIN RESISTANT STAPHYLOCOCCUS AUREUS  CULTURE, BLOOD (ROUTINE X 2) w Reflex to ID Panel     Status: None   Collection Time: 02/18/19 11:29 PM   Specimen: BLOOD  Result Value Ref Range Status   Specimen Description BLOOD LEFT ARM  Final   Special Requests   Final    BOTTLES DRAWN AEROBIC AND ANAEROBIC Blood Culture adequate volume   Culture   Final    NO GROWTH 5 DAYS Performed at [facility], [address] [place], [place] [address]    Report Status 02/23/2019 FINAL  Final  CULTURE, BLOOD (ROUTINE X 2) w Reflex to ID Panel     Status: None   Collection Time: 02/18/19 11:29 PM   Specimen: BLOOD  Result Value Ref Range Status   Specimen Description BLOOD LEFT ARM  Final   Special Requests   Final    BOTTLES DRAWN AEROBIC AND ANAEROBIC Blood Culture adequate volume   Culture   Final    NO GROWTH 5 DAYS Performed at [facility], [address], [place],  [address]    Report Status 02/23/2019 FINAL  Final    Coagulation Studies: No results for input(s): LABPROT, INR in the last 72 hours.  Urinalysis: No results for input(s): COLORURINE, LABSPEC, PHURINE, GLUCOSEU, HGBUR, BILIRUBINUR, KETONESUR, PROTEINUR,  UROBILINOGEN, NITRITE, LEUKOCYTESUR in the last 72 hours.  Invalid input(s): APPERANCEUR    Imaging: X-ray chest PA or AP  Result Date: 03/03/2019 CLINICAL DATA:  ETT placement EXAM: CHEST  1 VIEW COMPARISON:  03/01/2019 FINDINGS: Endotracheal tube with the tip 4 cm above the carina. Dual lumen left-sided central venous catheter with the tip projecting over the SVC. Bilateral mild interstitial thickening. No pleural effusion or pneumothorax. Linear band  of airspace disease in the right lung likely reflecting atelectasis. Stable cardiomegaly.  Prior CABG.  No acute osseous abnormality. IMPRESSION: 1. Cardiomegaly with pulmonary vascular congestion. 2. Linear band of airspace disease in the right lung likely reflecting atelectasis. Electronically Signed   By: Kathreen Devoid   On: 03/03/2019 17:53   DG Chest Port 1 View  Result Date: 03/04/2019 CLINICAL DATA:  Acute respiratory failure. EXAM: PORTABLE CHEST 1 VIEW COMPARISON:  March 03, 2019. FINDINGS: Stable cardiomediastinal silhouette. Endotracheal tube is unchanged in position. Left internal jugular catheter is unchanged in position. No pneumothorax or significant pleural effusion is noted. Status post coronary bypass graft. Mild bibasilar subsegmental atelectasis is noted. Bony thorax is unremarkable. IMPRESSION: Stable support apparatus. Stable mild bibasilar subsegmental atelectasis. Electronically Signed   By: Marijo Conception M.D.   On: 03/04/2019 07:54     Medications:   . sodium chloride Stopped (03/01/19 0429)  . sodium chloride    . dexmedetomidine (PRECEDEX) IV infusion 0.6 mcg/kg/hr (03/05/19 0346)  . dextrose 50 mL/hr (03/05/19 0846)  . norepinephrine (LEVOPHED) Adult infusion 1 mcg/min (03/05/19 0347)  . vancomycin Stopped (03/02/19 1752)   . aspirin  81 mg Oral Daily  . B-complex with vitamin C  1 tablet Per Tube Daily  . chlorhexidine gluconate (MEDLINE KIT)  15 mL Mouth Rinse BID  . Chlorhexidine Gluconate Cloth  6 each  Topical Q0600  . epoetin (EPOGEN/PROCRIT) injection  4,000 Units Intravenous Q M,W,F-HD  . fluticasone  2 spray Each Nare Daily  . free water  30 mL Per Tube Q4H  . insulin aspart  0-15 Units Subcutaneous Q4H  . levothyroxine  88 mcg Oral QAC breakfast  . mouth rinse  15 mL Mouth Rinse q12n4p  . mouth rinse  15 mL Mouth Rinse 10 times per day  . midodrine  10 mg Oral TID WC  . pantoprazole (PROTONIX) IV  40 mg Intravenous Q24H  . venlafaxine  75 mg Oral BID WC  . zinc sulfate  220 mg Oral Daily   sodium chloride, sodium chloride, fentaNYL (SUBLIMAZE) injection, fentaNYL (SUBLIMAZE) injection, haloperidol lactate, ipratropium-albuterol, ondansetron (ZOFRAN) IV  Assessment/ Plan:  [name] is a 63 y.o. white female with end-stage renal disease on hemodialysis, atrial fibrillation, chronic diastolic congestive heart failure, coronary artery disease status post CABG, diabetes mellitus type II, hypertension, hyperlipidemia, hypothyroidism, Covid positive in July 2020, recent nursing home stay in [place] Prolonged hospitalization for MRSA bacteremia/sepsis and now with acute encephalopathy  [facility] MWF [facility] [address] Left AVG 81.5kg  1. End Stage Renal Disease: on hemodialysis. With complication of dialysis device.  -rt arm AVG removed 12/30 due to suspected source of bacteremia -  MWF dialysis schedule.  - We will plan for hemodialysis tomorrow  - please use D10 if using for hypoglycemia - NS for volume as needed  2. Hypotension - midodrine 10 mg TID,   3. Anemia of chronic kidney disease:   Status post PRBC transfusion on 12/22 - EPO with HD treatments.   4. Secondary Hyperparathyroidism:  not currently on binders.  Lab Results  Component Value Date   PTH 135 (H) 02/22/2017   CALCIUM 8.5 (L) 03/05/2019   CAION 1.20 07/03/2013   PHOS 2.8 03/05/2019    5. Acute Encephalopathy:  -Remains encephalopathic  6. Sepsis from MRSA - Abx as per ID team - Source of  MRSA has not been identified -AV graft  removed 12/30  7. A Fib - requiring Anticoagulation.  heparin Drip, Eliquis on Hold  8. DM with CKD Insulin dependent Lab Results  Component Value Date   HGBA1C 5.3 02/09/2019   9. Left arm DVT Dx 12/9  10.  Acute respiratory failure Patient was not extubated after PEG procedure on 12/29      LOS: 24 Jamie Arias 12/31/20208:47 AM

## 2019-03-05 NOTE — Progress Notes (Signed)
Case Management contacted at the request of Dr. Leslye Peer for [facility] referral. Care manager will find who is assigned to this patient and relay message.

## 2019-03-06 ENCOUNTER — Inpatient Hospital Stay: Payer: Medicare Other

## 2019-03-06 LAB — CBC
HCT: 21.7 % — ABNORMAL LOW (ref 36.0–46.0)
Hemoglobin: 6.8 g/dL — ABNORMAL LOW (ref 12.0–15.0)
MCH: 29.7 pg (ref 26.0–34.0)
MCHC: 31.3 g/dL (ref 30.0–36.0)
MCV: 94.8 fL (ref 80.0–100.0)
Platelets: 171 10*3/uL (ref 150–400)
RBC: 2.29 MIL/uL — ABNORMAL LOW (ref 3.87–5.11)
RDW: 19.7 % — ABNORMAL HIGH (ref 11.5–15.5)
WBC: 7.2 10*3/uL (ref 4.0–10.5)
nRBC: 0 % (ref 0.0–0.2)

## 2019-03-06 LAB — GLUCOSE, CAPILLARY
Glucose-Capillary: 140 mg/dL — ABNORMAL HIGH (ref 70–99)
Glucose-Capillary: 141 mg/dL — ABNORMAL HIGH (ref 70–99)
Glucose-Capillary: 146 mg/dL — ABNORMAL HIGH (ref 70–99)
Glucose-Capillary: 156 mg/dL — ABNORMAL HIGH (ref 70–99)
Glucose-Capillary: 169 mg/dL — ABNORMAL HIGH (ref 70–99)
Glucose-Capillary: 202 mg/dL — ABNORMAL HIGH (ref 70–99)

## 2019-03-06 LAB — BASIC METABOLIC PANEL
Anion gap: 9 (ref 5–15)
BUN: 23 mg/dL (ref 8–23)
CO2: 25 mmol/L (ref 22–32)
Calcium: 8 mg/dL — ABNORMAL LOW (ref 8.9–10.3)
Chloride: 100 mmol/L (ref 98–111)
Creatinine, Ser: 2.63 mg/dL — ABNORMAL HIGH (ref 0.44–1.00)
GFR calc Af Amer: 21 mL/min — ABNORMAL LOW (ref >60)
GFR calc non Af Amer: 18 mL/min — ABNORMAL LOW (ref >60)
Glucose, Bld: 206 mg/dL — ABNORMAL HIGH (ref 70–99)
Potassium: 3.9 mmol/L (ref 3.5–5.1)
Sodium: 134 mmol/L — ABNORMAL LOW (ref 135–145)

## 2019-03-06 LAB — MAGNESIUM: Magnesium: 2.1 mg/dL (ref 1.7–2.4)

## 2019-03-06 LAB — PHOSPHORUS: Phosphorus: 3.5 mg/dL (ref 2.5–4.6)

## 2019-03-06 LAB — VANCOMYCIN, RANDOM: Vancomycin Rm: 24

## 2019-03-06 LAB — HEMOGLOBIN AND HEMATOCRIT, BLOOD
HCT: 25.4 % — ABNORMAL LOW (ref 36.0–46.0)
Hemoglobin: 8.8 g/dL — ABNORMAL LOW (ref 12.0–15.0)

## 2019-03-06 LAB — TROPONIN I (HIGH SENSITIVITY): Troponin I (High Sensitivity): 14 ng/L (ref <18)

## 2019-03-06 LAB — CORTISOL: Cortisol, Plasma: 18.4 ug/dL

## 2019-03-06 LAB — PREPARE RBC (CROSSMATCH)

## 2019-03-06 LAB — HEPARIN LEVEL (UNFRACTIONATED): Heparin Unfractionated: 0.34 IU/mL (ref 0.30–0.70)

## 2019-03-06 MED ORDER — STERILE WATER FOR INJECTION IJ SOLN
INTRAMUSCULAR | Status: AC
  Filled 2019-03-06: qty 10

## 2019-03-06 MED ORDER — FREE WATER
20.0000 mL | Status: DC
  Administered 2019-03-06 – 2019-04-20 (×248): 20 mL

## 2019-03-06 MED ORDER — SODIUM CHLORIDE 0.9% IV SOLUTION: Freq: Once | INTRAVENOUS | Status: AC

## 2019-03-06 MED ORDER — ALBUMIN HUMAN 25 % IV SOLN
25.0000 g | Freq: Once | INTRAVENOUS | Status: AC
  Administered 2019-03-06: 17:00:00 25 g via INTRAVENOUS
  Filled 2019-03-06: qty 100

## 2019-03-06 NOTE — Progress Notes (Signed)
HD Tx completed, tolerated well, UF goal met.    03/06/19 1824  Vital Signs  Resp (!) 22  Oxygen Therapy  SpO2 98 %  O2 Device Room Air  During Hemodialysis Assessment  HD Safety Checks Performed Yes  KECN 66.1 KECN  Dialysis Fluid Bolus Normal Saline  Bolus Amount (mL) 250 mL  Intra-Hemodialysis Comments Tx completed;Tolerated well

## 2019-03-06 NOTE — Progress Notes (Signed)
PULMONARY / CRITICAL CARE MEDICINE   Name: ILONA COLLEY MRN: [id number] DOB: 1956/01/31    ADMISSION DATE:  02/09/2019 CONSULTATION DATE:  03/03/2019  REFERRING MD:  Dr. Leslye Peer  CHIEF COMPLAINT:  Remains intubated post PEG placement 03/03/19  BRIEF DISCUSSION: 64 y.o. Female with PMH notable for ESRD on HD, COVID-19 infection in 09/2018, HFpEF, who was admitted 27/0/62 due to Metabolic Encephalopathy & Septic Shock secondary to MRSA Bacteremia.  Infectious disease is following.  Suspected source of Bacteremia is AV graft with stent.  On 12/29 she underwent PEG placement due to persistent encephalopathy and poor PO intake.  She returns to ICU post PEG and remains intubated, as she is to undergo AV Graft removal on 12/30.  Pt also has age indeterminate left arm DVT. PAST MEDICAL HISTORY :  She  has a past medical history of Anemia, Chronic diastolic CHF (congestive heart failure) ([place]), Coronary artery disease, Diabetes mellitus without complication ([place]), Diabetic neuropathy ([place]), Dialysis patient ([place]), ESRD (end stage renal disease) ([place]), GERD (gastroesophageal reflux disease), Hyperlipidemia, Hypertension, Hypothyroidism, Myocardial infarction ([place]) (2015), Neuropathy, Pleural effusion (2015), Pulmonary hypertension ([place]), Renal insufficiency, and Wears dentures.  PAST SURGICAL HISTORY: She  has a past surgical history that includes Cataract extraction (Bilateral); Coronary artery bypass graft (N/A, 07/02/2013); Intraoprative transesophageal echocardiogram (N/A, 07/02/2013); Thoracentesis (Left, 2015); Cholecystectomy (N/A, 12/09/2014); Esophagogastroduodenoscopy (egd) with propofol (N/A, 11/24/2015); Cardiac catheterization (Right, 12/06/2015); Eye surgery (Bilateral); Cardiac catheterization; AV fistula placement (Right, 02/03/2016); PORTA CATH REMOVAL (N/A, 06/01/2016); PERIPHERAL VASCULAR THROMBECTOMY (Right, 09/28/2016); A/V Fistulagram (Right, 12/17/2016); A/V Fistulagram (Right, 01/07/2017); A/V  SHUNT INTERVENTION (N/A, 02/22/2017); A/V Fistulagram (Right, 12/03/2017); PERIPHERAL VASCULAR THROMBECTOMY (Right, 05/20/2018); A/V Fistulagram (Right, 02/23/2019); PEG placement (N/A, 03/03/2019); and Arteriovenous goretex graft removal (Right, 03/04/2019).  Allergies  Allergen Reactions  . Nsaids Other (See Comments)    Contraindicated due to kidney disease.  Marland Kitchen Doxycycline Other (See Comments)    tremor    No current facility-administered medications on file prior to encounter.   Current Outpatient Medications on File Prior to Encounter  Medication Sig  . ACCU-CHEK FASTCLIX LANCETS MISC Check blood glucose twice daily, diagnosis code E11.9  . acetaminophen (TYLENOL) 325 MG tablet Take 650 mg by mouth daily as needed for moderate pain or headache.   . Alirocumab (PRALUENT) 75 MG/ML SOAJ Inject 75 mg into the skin every 14 (fourteen) days.  . Amino Acids-Protein Hydrolys (FEEDING SUPPLEMENT, PRO-STAT SUGAR FREE 64,) LIQD Take 30 mLs by mouth 3 (three) times daily between meals.  Marland Kitchen amiodarone (PACERONE) 200 MG tablet Take 200 mg by mouth daily.  Marland Kitchen aspirin EC 81 MG tablet Take 81 mg by mouth daily.  Marland Kitchen atorvastatin (LIPITOR) 40 MG tablet Take 40 mg by mouth daily.  . Blood Glucose Monitoring Suppl (ACCU-CHEK AVIVA PLUS) w/Device KIT Use as directed to test blood sugar up to three times daily  . Calcium Acetate 667 MG TABS Take 2,001 mg by mouth 3 (three) times daily with meals.  Marland Kitchen ELIQUIS 5 MG TABS tablet Take 5 mg by mouth 2 (two) times daily.   . ferrous sulfate 325 (65 FE) MG tablet Take 325 mg by mouth daily with breakfast.  . FLUoxetine (PROZAC) 20 MG capsule Take 20 mg by mouth daily.  . fluticasone (FLONASE) 50 MCG/ACT nasal spray Place 2 sprays into both nostrils daily. (Patient taking differently: Place 2 sprays into both nostrils daily as needed for allergies or rhinitis. )  . furosemide (LASIX) 20 MG tablet Take 40 mg by mouth every  other day. Non-dialysis days   . gabapentin  (NEURONTIN) 300 MG capsule Take 300 mg by mouth 2 (two) times daily.   Marland Kitchen glucose blood (ACCU-CHEK AVIVA) test strip Use as instructed to test blood sugar up to 3 times daily  . Insulin Glargine (BASAGLAR KWIKPEN) 100 UNIT/ML SOPN Inject 18 Units into the skin daily.  . Lancets (ACCU-CHEK SOFT TOUCH) lancets Use as instructed  . levothyroxine (SYNTHROID) 88 MCG tablet Take 88 mcg by mouth daily before breakfast.  . lidocaine-prilocaine (EMLA) cream Apply 1 application topically as needed (port access).  . midodrine (PROAMATINE) 10 MG tablet Take 1 tablet (10 mg total) by mouth 3 (three) times daily with meals. (Patient taking differently: Take 10 mg by mouth 3 (three) times daily as needed (low BP). )  . multivitamin (RENA-VIT) TABS tablet Take 1 tablet by mouth at bedtime.  . nitroGLYCERIN (NITROSTAT) 0.4 MG SL tablet DISSOLVE ONE TABLET UNDER THE TONGUE EVERY 5 MINUTES AS NEEDED FOR CHEST PAIN.  DO NOT EXCEED A TOTAL OF 3 DOSES IN 15 MINUTES (Patient taking differently: Place 0.4 mg under the tongue every 5 (five) minutes as needed for chest pain. DISSOLVE ONE TABLET UNDER THE TONGUE EVERY 5 MINUTES AS NEEDED FOR CHEST PAIN.  DO NOT EXCEED A TOTAL OF 3 DOSES IN 15 MINUTES)  . ondansetron (ZOFRAN-ODT) 4 MG disintegrating tablet Take 1 tablet (4 mg total) by mouth every 8 (eight) hours as needed for nausea or vomiting. appt with PCP further refills Dr. Caryl Bis  . pantoprazole (PROTONIX) 40 MG tablet Take 40 mg by mouth daily.  . vitamin C (VITAMIN C) 500 MG tablet Take 1 tablet (500 mg total) by mouth daily.  Marland Kitchen zinc sulfate 220 (50 Zn) MG capsule Take 1 capsule (220 mg total) by mouth daily.  . mirtazapine (REMERON) 15 MG tablet Take 1 tablet (15 mg total) by mouth at bedtime. (Patient not taking: Reported on 02/09/2019)    FAMILY HISTORY:  Her She indicated that her mother is deceased. She indicated that her father is deceased. She indicated that her sister is alive. She indicated that the status  of her maternal grandmother is unknown. She indicated that the status of her paternal grandfather is unknown. She indicated that both of her daughters are alive. She indicated that both of her sons are alive. She indicated that the status of her neg hx is unknown.   SOCIAL HISTORY: She  reports that she has never smoked. She has never used smokeless tobacco. She reports that she does not drink alcohol or use drugs.    Physical Exam Constitutional:      Appearance: She is obese. She is ill-appearing.  HENT:     Head: Normocephalic.     Nose: Nose normal.     Mouth/Throat:     Mouth: Mucous membranes are moist.  Eyes:     Extraocular Movements: Extraocular movements intact.  Cardiovascular:     Rate and Rhythm: Regular rhythm.  Pulmonary:     Effort: Pulmonary effort is normal.  Abdominal:     Palpations: Abdomen is soft.  Musculoskeletal:     Cervical back: Normal range of motion and neck supple.  Skin:    Coloration: Skin is jaundiced.  Neurological:     Mental Status: She is disoriented.      REVIEW OF SYSTEMS:   Unable to assess due to critical illness and intubation  SUBJECTIVE:  Unable to assess due to critical illness and intubation  VITAL SIGNS: BP Marland Kitchen)  118/59   Pulse 87   Temp 98.2 F (36.8 C) (Axillary)   Resp 10   Ht 5' 4.02" (1.626 m)   Wt 75.5 kg   SpO2 100%   BMI 28.56 kg/m   HEMODYNAMICS:    VENTILATOR SETTINGS: Vent Mode: Spontaneous FiO2 (%):  [24 %] 24 % PEEP:  [5 cmH20] 5 cmH20 Pressure Support:  [5 cmH20] 5 cmH20  INTAKE / OUTPUT: I/O last 3 completed shifts: In: 2641.1 [I.V.:1091; Other:210; NG/GT:1190; IV Piggyback:150.1] Out: 1175 [Drains:50; Other:500; Stool:625]  PHYSICAL EXAMINATION: General:  Acute on chronically ill appearing female, laying in bed, intubated, in no acute distress Neuro:  Opens eyes and withdraws from pain, pupils PERRLA 4 mm bilaterally HEENT:  Atraumatic, normocephalic, neck supple, no JVD, ETT in  place Cardiovascular:  Regular rate & rhythm, s1s2 noted, no M/R/G Lungs:  Clear diminished breath sounds to auscultation bilaterally, no wheezing or rhonchi noted, even, vent assisted Abdomen:  Obese, soft, nontender, nondistended, no guarding or rebound tenderness, BS hypoactive Musculoskeletal:  No deformities Skin:  Left heel stage II decubitus ulcer with black eschar, right heel stage II decubitus ulcer  LABS:  BMET Recent Labs  Lab 03/04/19 0417 03/05/19 0356 03/06/19 0345  NA 138 137 134*  K 4.2 3.6 3.9  CL 101 99 100  CO2 _0 BUN 24* 14 23  CREATININE 3.14* 2.16* 2.63*  GLUCOSE 136* 102* 206*    Electrolytes Recent Labs  Lab 03/04/19 0417 03/05/19 0356 03/06/19 0345  CALCIUM 8.5* 8.5* 8.0*  MG 2.1 1.7 2.1  PHOS 3.7 2.8 3.5    CBC Recent Labs  Lab 03/04/19 0417 03/05/19 0356 03/06/19 0345  WBC 6.7 9.7 7.2  HGB 8.1* 8.6* 6.8*  HCT 24.4* 27.2* 21.7*  PLT 196 225 171    Coag's No results for input(s): APTT, INR in the last 168 hours.  Sepsis Markers No results for input(s): LATICACIDVEN, PROCALCITON, O2SATVEN in the last 168 hours.  ABG Recent Labs  Lab 03/03/19 1843 03/04/19 0520  PHART 7.56* 7.51*  PCO2ART 34 36  PO2ART 190* 135*    Liver Enzymes Recent Labs  Lab 02/28/19 0626 03/01/19 0429 03/05/19 0356  AST _1 ALT _2 ALKPHOS 63 58 88  BILITOT 1.0 0.9 0.8  ALBUMIN 2.0* 1.8* 1.9*    Cardiac Enzymes No results for input(s): TROPONINI, PROBNP in the last 168 hours.  Glucose Recent Labs  Lab 03/05/19 1109 03/05/19 1539 03/05/19 1910 03/05/19 2306 03/06/19 0332 03/06/19 0753  GLUCAP 116* 114* 185* 234* 202* 140*    Imaging No results found.   STUDIES:  12/7 - CT Head wo Contrast>> 1. No acute intracranial abnormality. 2. Mild parenchymal atrophy and chronic microvascular angiopathy. 12/7 - 2D Echocardiogram>> EF 60-65%, Grade II Diastolic Dysfunction 71/6 - Venous US LUE>>Examination is positive  for age-indeterminate short segment occlusive DVT involving the mid humeral aspect of one of the paired brachial veins. There is no extension of this short-segment occlusive DVT to the more proximal venous system of the left upper extremity. 12/10 - MR Brain w/o Contrast >>1. No acute intracranial abnormality. 2. Mild chronic small vessel disease. 12/15 - CT Cervical Spine>> No evidence of discitis/osteomyelitis or collection. 12/15 - CT Thoracic Spine>>No evidence of discitis/osteomyelitis in the thoracic spine. No collection. Bilateral pleural effusions and bibasilar atelectasis/consolidation. 12/15 - CT Lumbar Spine>> No evidence of discitis/osteomyelitis in the lumbar spine. Nonspecific mild presacral edema. No paraspinal collection 12/16 - DG 2V Left  Foot>> Calcaneal spur.  No evidence of osteomyelitis. 12/24 - CT Head wo Contrast>>1. Generalized atrophy and chronic ischemic microangiopathy without acute intracranial abnormality. 12/24 - CT Cervical Spine>> No acute fracture or static subluxation of the cervical spine.  CULTURES: SARS-CoV-2 PCR 12/7>> negative Blood culture 12/7>> MRSA MRSA PCR 12/8>> positive Blood culture 12/9>> MRSA Blood cultures 12/11>> MRSA Blood cultures 12/13>> MRSA Wound culture from left heel 12/15>> MRSA Blood cultures 12/16>> no growth to date  ANTIBIOTICS: Ceftaroline 12/14>> 12/22 Daptomycin 12/14>>12/28 Vancomycin 12/24>>  SIGNIFICANT EVENTS: 12/7>> admission to MedSurg 12/8>> transfer to stepdown due to hypotension requiring vasopressors; PCCM and infectious disease consulted 95/18>> fall, no complications noted 84/16>> PEG tube placement, returns to ICU, remains intubated  LINES/TUBES: Right Femoral CVC 12/8>>12/14 Left IJ Tunneled HD Catheter Left Femoral CVC 12/19>> PEG tube placement 12/29>> ETT 12/29>>12/31    ASSESSMENT / PLAN:  Post operative respiratory failure -patient was unable to be weaned off mechanical ventilation  after PEG tube -patient is now s/p vascular procedure -patient has been on spontaneous breathing trial today for 2 hours - doing ok but mentation is lethargic GCS8T -liberated from MV 12/31 - doing well on Room air now   Post anesthesia/post operative transient hypotension with intercurrent septic shock  - s/p vasopressor support  -continue current IV antimicrobials   Septic Shock  - due to MRSA bacteremia  - ID on case - appreicate input  - IV vancomycinn - 6 weeks until 04/01/19 to be administered during HD -remains on Levophed - pt with L subclavian trialysis and Left fem venous TLC - high risk for CLABSI will need to monitor and remove asap - post RUE AVF repair with vascular surgery - wrapped with vac dressing - serosang active draining low volume -Cortisol level today-not starting empiric steroids due to bleeding/MRSA infection/DM/wound healing   Altered mental status with lethargy  - likely multifactorial   - husband states patient at baseline is with little communication - possible vascular dementia, she is also with chronic ESRD and therefore at increased risk for vascular dementia and recurrent metabolic encephalopathy. She is currently with septic shock and component of septic encephalopathy.  Remote COVID infection with likely further insult to mentation and possible additional embolic cerebral phenomenon. 02/12/19-MRI brain with multifocal white matter hyperintestity due to ischemic microangiopathy, will repeat to eval for interval changes.     Acute blood loss anemia  - s/p AFV repair   - background of AOCD with ESRD  - s/p 1uPRBC  -monitor vitals, repeat h/h in 6h   Chronic HFpEF Paroxsymal Atrial Fibrillation ~ currently in NSR Hx: HTN, CAD s/p stent, Chronic HFpEF Continuous cardiac monitoring Maintain MAP >60 (given she is on Hemodialysis) Levophed if needed to maintain MAP goal Continue Midodrine Cautious IV Fluids given ESRD on HD Volume removal with  HD Heparin drip for Anticoagulation 2D Echocardiogram 02/10/19>> EF 60-65%, grade II diastolic dysfuntion  RENAL A:   ESRD on HD P:   Monitor I&O's / urinary output Follow BMP Ensure adequate renal perfusion Avoid nephrotoxic agents as able Replace electrolytes as indicated Nephrology following, appreciate input ~ HD as per Nephrology  GASTROINTESTINAL A:   No acute issues  Hx: GERD P:   NPO for now; Able to start tube feeds on 12/30 following AV graft removal Protonix for SUP PEG placed on 03/03/19 by GI, will follow recommendations  HEMATOLOGIC A:   Left Arm DVT P:  Monitor for S/Sx of bleeding Trend CBC Heparin drip for VTE Prophylaxis/Anticoagulation  Transfuse for Hgb <7    ENDOCRINE A:   DM II  Hypothyoidism P:   CBG's SSI Follow ICU Hypo/hyperglycemia protocol Continue Synthroid    FAMILY  - Updates: No family at bedside during NP rounds 03/03/19.  Pt prognosis is poor.  Pt is DNR.  - Inter-disciplinary family meet or Palliative Care currently following.   Critical care provider statement:    Critical care time (minutes):  33   Critical care time was exclusive of:  Separately billable procedures and  treating other patients   Critical care was necessary to treat or prevent imminent or  life-threatening deterioration of the following conditions:  altered mental status with lethrargy, post operative respiratory failure, multiple comorbid conditions   Critical care was time spent personally by me on the following  activities:  Development of treatment plan with patient or surrogate,  discussions with consultants, evaluation of patient's response to  treatment, examination of patient, obtaining history from patient or  surrogate, ordering and performing treatments and interventions, ordering  and review of laboratory studies and re-evaluation of patient's condition   I assumed direction of critical care for this patient from another  provider in my  specialty: no      Ottie Glazier, M.D.  [facility] [place]

## 2019-03-06 NOTE — Progress Notes (Signed)
[name], female   DOB: 12/07/1955, 63 y.o.   MRN: [id number] [facility] PROGRESS NOTE  Jamie Arias [id number] DOB: March 30, 1955 DOA: 02/09/2019 PCP: [name], MD  HPI/Subjective: Patient opens her eyes.  Husband at the bedside.  She was more responsive today than she has been the entire hospitalization.  May be because the husband was at the bedside.  He shook her head no, dropped her systolic blood pressure too no pain  Objective: Vitals:   03/06/19 1145 03/06/19 1200  BP: (!) 114/55 (!) 105/54  Pulse:    Resp: (!) 21 20  Temp:    SpO2:      Filed Weights   02/25/19 0842 02/25/19 1200 03/02/19 0930  Weight: 74.1 kg 73.2 kg 75.5 kg    ROS: Review of Systems  Unable to perform ROS: Acuity of condition  Gastrointestinal: Negative for abdominal pain.  Musculoskeletal: Negative for joint pain.   Exam: Physical Exam  HENT:  Nose: No mucosal edema.  Eyes: Pupils are equal, round, and reactive to light. Conjunctivae and lids are normal.  Neck: Carotid bruit is not present.  Cardiovascular: Regular rhythm, S1 normal and S2 normal. Exam reveals no gallop.  No murmur heard. Respiratory: No respiratory distress. She has decreased breath sounds in the right lower field and the left lower field. She has no wheezes. She has no rhonchi. She has no rales.  GI: Soft. Bowel sounds are normal. There is no abdominal tenderness.  Musculoskeletal:     Right ankle: No swelling.     Left ankle: No swelling.  Lymphadenopathy:    She has no cervical adenopathy.  Neurological: She is alert.  Only answered a few questions.  Tried to speak but difficult to understand.  Skin: Skin is warm. Nails show no clubbing.  Left heel stage II decubitus ulcer with black eschar. Right heel stage II decubitus ulcer As per nursing staff stage I decubitus ulcer sacrum Bruising upper extremities Wound VAC over right arm  Psychiatric:  Shook her head no, too no pain       Data Reviewed: Basic Metabolic Panel: Recent Labs  Lab 03/02/19 0329 03/03/19 0636 03/03/19 2100 03/04/19 0417 03/05/19 0356 03/06/19 0345  NA 134* 139 138 138 137 134*  K 4.4 4.0 4.0 4.2 3.6 3.9  CL 96* 101 100 101 99 100  CO2 22 26 28 28 26 25   GLUCOSE 130* 125* 74 136* 102* 206*  BUN 28* 19 22 24* 14 23  CREATININE 3.61* 2.44* 2.95* 3.14* 2.16* 2.63*  CALCIUM 8.3* 8.3* 8.4* 8.5* 8.5* 8.0*  MG 1.9 2.0  --  2.1 1.7 2.1  PHOS 2.9 1.8*  --  3.7 2.8 3.5   Liver Function Tests: Recent Labs  Lab 02/28/19 0626 03/01/19 0429 03/05/19 0356  AST 22 22 25   ALT 19 17 16   ALKPHOS 63 58 88  BILITOT 1.0 0.9 0.8  PROT 4.9* 4.6* 5.6*  ALBUMIN 2.0* 1.8* 1.9*   CBC: Recent Labs  Lab 03/03/19 0636 03/03/19 2139 03/04/19 0417 03/05/19 0356 03/06/19 0345  WBC 10.0 10.0 6.7 9.7 7.2  HGB 8.9* 8.6* 8.1* 8.6* 6.8*  HCT 26.3* 26.6* 24.4* 27.2* 21.7*  MCV 88.3 93.7 89.7 94.4 94.8  PLT 172 219 196 225 171   Cardiac Enzymes: Recent Labs  Lab 03/02/19 0330  CKTOTAL 31*   BNP (last 3 results) Recent Labs    03/01/19 0429  BNP 720.0*     CBG: Recent Labs  Lab 03/05/19 1910 03/05/19 2306 03/06/19 0332 03/06/19 0753 03/06/19 1038  GLUCAP 185* 234* 202* 140* 146*     Studies: No results found.  Scheduled Meds: . aspirin  81 mg Oral Daily  . B-complex with vitamin C  1 tablet Per Tube Daily  . Chlorhexidine Gluconate Cloth  6 each Topical Q0600  . epoetin (EPOGEN/PROCRIT) injection  4,000 Units Intravenous Q M,W,F-HD  . feeding supplement (PRO-STAT SUGAR FREE 64)  30 mL Per Tube Daily  . fluticasone  2 spray Each Nare Daily  . free water  30 mL Per Tube Q4H  . insulin aspart  0-15 Units Subcutaneous Q4H  . levothyroxine  88 mcg Oral QAC breakfast  . mouth rinse  15 mL Mouth Rinse BID  . midodrine  10 mg Oral TID WC  . pantoprazole (PROTONIX) IV  40 mg Intravenous Q24H  . sterile water (preservative free)      . venlafaxine  75 mg Oral BID WC  . zinc  sulfate  220 mg Oral Daily   Continuous Infusions: . sodium chloride Stopped (03/01/19 0429)  . sodium chloride    . albumin human    . feeding supplement (OSMOLITE 1.5 CAL) 50 mL/hr at 03/05/19 1900  . heparin 1,100 Units/hr (03/06/19 1110)  . norepinephrine (LEVOPHED) Adult infusion Stopped (03/05/19 0401)  . vancomycin Stopped (03/02/19 1752)    Assessment/Plan:   1. Sepsis secondary to MRSA bacteremia.  Currently on vancomycin.   Appreciate ID consultation.  Will need 6-week long course of IV therapy.  Vascular removed her graft right arm on 03/04/2019 which could be the source of infection. 2. Hypotension.  Patient on low-dose Levophed today 3. Acute blood loss anemia.  Hemoglobin dropped down to 6.8 today.  Patient being transfused 1 unit of packed red blood cells. 4. End-stage renal disease on hemodialysis.  Tunneled catheter placed by vascular.  Graft removal right arm on 03/04/2019 5. Acute metabolic encephalopathy.  Mental status still impaired but opens her eyes today to my voice.  MRI of the brain negative.  EEG negative.  Ammonia normal.  TSH normal.  In speaking with prior rounding physicians patient's mental status has been impaired for a while now.  Overall prognosis poor.  Patient is little more alert today 6. Left arm DVT.  Continue heparin. 7. Paroxysmal atrial  fibrillation on amiodarone.  Continue heparin 8. Chronic diastolic congestive heart failure.  Dialysis to manage fluid status. 9. Type 2 diabetes mellitus.  On sliding scale.  10. Hypothyroidism unspecified on Synthroid 11. Overall prognosis is poor.  Palliative care following.  Patient a DNR. 12. Diarrhea.  Hold off on stool studies at this time.  13. Present on admission, stage II decubiti right heel, left heel and stage I decubiti on buttock 14. Nutrition: PEG placed 03/03/2019.  Continue tube feeding 15. Acute hypoxic respiratory failure.  Patient intubated for PEG procedure and extubated 03/05/2019.   Oxygen via nasal cannula.  Code Status:     Code Status Orders  (From admission, onward)         Start     Ordered   02/21/19 0930  Do not attempt resuscitation (DNR)  Continuous    Question Answer Comment  In the event of cardiac or respiratory ARREST Do not call a "code blue"   In the event of cardiac or respiratory ARREST Do not perform Intubation, CPR, defibrillation or ACLS   In the event of cardiac or respiratory ARREST Use medication by any route, position,  wound care, and other measures to relive pain and suffering. May use oxygen, suction and manual treatment of airway obstruction as needed for comfort.      02/21/19 0932        Code Status History    Date Active Date Inactive Code Status Order ID Comments User Context   02/09/2019 1755 02/21/2019 0932 Full Code [id number]  Thornell Mule, MD ED   08/24/2018 0022 09/15/2018 1720 Full Code [id number]  Shela Leff, MD Inpatient   09/04/2017 1510 09/06/2017 1920 Full Code [id number]  Epifanio Lesches, MD ED   02/15/2017 1243 02/16/2017 2023 Full Code [id number]  Saundra Shelling, MD Inpatient   10/23/2015 0828 10/23/2015 0908 Full Code [id number]  Saundra Shelling, MD Inpatient   09/03/2015 0130 09/03/2015 1619 Full Code [id number]  Lance Coon, MD Inpatient   07/02/2013 1327 07/14/2013 1540 Full Code [id number]  Nani Skillern, PA-C Inpatient   06/29/2013 2153 07/02/2013 1327 Full Code [id number]  Larey Dresser, MD Inpatient   Advance Care Planning Activity     Family Communication: Spoke with husband at the bedside Disposition Plan: Transitional care team to look into LTAC facility  Consultants:  Nephrology  Infectious disease  Vascular surgery  Critical care specialist  Antibiotics:  Vancomycin  Time spent: 26 minutes  [place]

## 2019-03-06 NOTE — Consult Note (Signed)
Pharmacy Antibiotic Note  Jamie Arias is a 64 y.o. female admitted on 02/09/2019 with methicillin resistant Staphylococcus aureus.  Pharmacy has been consulted for vancomycin dosing (previous antibiotics included daptomycin and ceftaroline) She recently underwent PEG placement and is POD#1 for AVG removal.  Patient received vancomycin loading dose 1500 mg x1 with dialysis on 12/24.  (per ID: Post covid morbidity- Long COVID)    Plan:  Maintenance dose:  Vancomycin 750mg  IV qHD MWF  Patient received dose 12/28 with HD, but missed dose on 12/30 due to surgery.  However, patient received shorter 2 hour HD session on 12/30.  Based on pharmacokinetic estimation, patient is likely subtherapeutic (calculated level ~18).  Therefore, give vancomycin 500 mg x1 dose tonight.  Plan to draw random vancomycin level tomorrow before HD (12/31).   Goal pre-HD vanco level is 20 to 25 per Dr. Delaine [name].  1/1:  Vancomycin level pre-HD= 24 mcg/ml. Continue current therapy. F/u next Vanc level pre-HD with 3rd HD from this level (Wed 1/6 ?)    Height: 5' 4.02" (162.6 cm) Weight: 166 lb 7.2 oz (75.5 kg) IBW/kg (Calculated) : 54.74  Temp (24hrs), Avg:98 F (36.7 C), Min:97.6 F (36.4 C), Max:98.7 F (37.1 C)  Recent Labs  Lab 03/02/19 0330 03/03/19 0636 03/03/19 2100 03/03/19 2139 03/04/19 0417 03/05/19 0356 03/06/19 0345  WBC  --  10.0  --  10.0 6.7 9.7 7.2  CREATININE  --  2.44* 2.95*  --  3.14* 2.16* 2.63*  VANCORANDOM 19  --   --   --   --   --  24    Estimated Creatinine Clearance: 21.2 mL/min (A) (by C-G formula based on SCr of 2.63 mg/dL (H)).    Antimicrobials this admission: ceftaroline 12/14 >> 12/21 daptomycin 12/14 >> 12/26 Vancomycin 12/24 >>  Microbiology results: 12/30 Aerobic/Anaerobic Cx:  NG 12/16 BCx: NG 12/15 WCx MRSA 12/13 BCx: MRSA   Thank you for allowing pharmacy to be a part of this patient's care.  Noralee Space, PharmD Pharmacy Resident   03/06/2019 7:44 AM

## 2019-03-06 NOTE — Progress Notes (Signed)
Pre HD TX   03/06/19 1500  Hand-Off documentation  Report given to (Full Name) Beatris Ship, RN   Vital Signs  Temp 98.9 F (37.2 C)  Temp Source Axillary  Pulse Rate (!) 104  Pulse Rate Source Monitor  Resp 19  BP 122/89  BP Location Left Arm  BP Method Automatic  Patient Position (if appropriate) Lying  Oxygen Therapy  SpO2 100 %  O2 Device Room Air  Pain Assessment  Pain Scale 0-10  Pain Score 0  Dialysis Weight  Weight 83 kg  Type of Weight Pre-Dialysis  Time-Out for Hemodialysis  What Procedure? HD  Pt Identifiers(min of two) First/Last Name;MRN/Account#  Correct Site? Yes  Correct Side? Yes  Correct Procedure? Yes  Consents Verified? Yes  Rad Studies Available? N/A  Safety Precautions Reviewed? Yes  Engineer, civil (consulting) Number 5  Station Number  (Bedside ICU 1)  UF/Alarm Test Passed  Conductivity: Meter 14  Conductivity: Machine  14  pH 7.2  Reverse Osmosis WRO 4  Normal Saline Lot Number [id number]  Dialyzer Lot Number [id number]  Disposable Set Lot Number [id number]  Machine Temperature 98.6 F (37 C)  Musician and Audible Yes  Blood Lines Intact and Secured Yes  Pre Treatment Patient Checks  Vascular access used during treatment Catheter  HD catheter dressing before treatment WDL  Hepatitis B Surface Antigen Results Negative  Date Hepatitis B Surface Antigen Drawn 02/10/19  Hepatitis B Surface Antibody  (<10)  Date Hepatitis B Surface Antibody Drawn 02/10/19  Hemodialysis Consent Verified Yes  Hemodialysis Standing Orders Initiated Yes  ECG (Telemetry) Monitor On Yes  Prime Ordered Normal Saline  Length of  DialysisTreatment -hour(s) 3 Hour(s)  Dialysis Treatment Comments Na 140  Dialyzer Elisio 17H NR  Dialysate 3K;2.5 Ca  Dialysis Anticoagulant None  Dialysate Flow Ordered 600  Blood Flow Rate Ordered 400 mL/min  Ultrafiltration Goal 0.5 Liters  Pre Treatment Labs CBC  Dialysis Blood Pressure Support Ordered Normal Saline  During  Hemodialysis Assessment  Blood Flow Rate (mL/min) 400 mL/min  Arterial Pressure (mmHg) -170 mmHg  Venous Pressure (mmHg) 130 mmHg  Transmembrane Pressure (mmHg) 50 mmHg  Ultrafiltration Rate (mL/min) 340 mL/min  Dialysate Flow Rate (mL/min) 600 ml/min  Conductivity: Machine  14  HD Safety Checks Performed Yes  Dialysis Fluid Bolus Normal Saline  Bolus Amount (mL) 250 mL  Intra-Hemodialysis Comments Tx initiated  Education / Care Plan  Dialysis Education Provided No (Comment)  Documented Education in Care Plan  (Pt non interactive )  Hemodialysis Catheter Left Internal jugular Double lumen Permanent (Tunneled)  Placement Date/Time: 02/23/19 1514   Time Out: Correct patient;Correct site;Correct procedure  Maximum sterile barrier precautions: Hand hygiene;Cap;Mask;Sterile gown;Sterile gloves;Large sterile sheet  Site Prep: Chlorhexidine (preferred)  Local Anes...  Site Condition No complications  Blue Lumen Status Blood return noted  Red Lumen Status Blood return noted  Purple Lumen Status N/A  Dressing Type Biopatch;Occlusive  Dressing Status Clean;Dry;Intact  Drainage Description None  Dressing Change Due 03/09/19

## 2019-03-06 NOTE — Progress Notes (Signed)
HD TX started w/o complication.  03/06/19 1500  Hand-Off documentation  Report given to (Full Name) Beatris Ship, RN   Vital Signs  Temp 98.9 F (37.2 C)  Temp Source Axillary  Pulse Rate (!) 104  Pulse Rate Source Monitor  Resp 19  BP 122/89  BP Location Left Arm  BP Method Automatic  Patient Position (if appropriate) Lying  Oxygen Therapy  SpO2 100 %  O2 Device Room Air  Pain Assessment  Pain Scale 0-10  Pain Score 0  Dialysis Weight  Weight 83 kg  Type of Weight Pre-Dialysis  Time-Out for Hemodialysis  What Procedure? HD  Pt Identifiers(min of two) First/Last Name;MRN/Account#  Correct Site? Yes  Correct Side? Yes  Correct Procedure? Yes  Consents Verified? Yes  Rad Studies Available? N/A  Safety Precautions Reviewed? Yes  Engineer, civil (consulting) Number 5  Station Number  (Bedside ICU 1)  UF/Alarm Test Passed  Conductivity: Meter 14  Conductivity: Machine  14  pH 7.2  Reverse Osmosis WRO 4  Normal Saline Lot Number [id number]  Dialyzer Lot Number [id number]  Disposable Set Lot Number [id number]  Machine Temperature 98.6 F (37 C)  Musician and Audible Yes  Blood Lines Intact and Secured Yes  Pre Treatment Patient Checks  Vascular access used during treatment Catheter  HD catheter dressing before treatment WDL  Hepatitis B Surface Antigen Results Negative  Date Hepatitis B Surface Antigen Drawn 02/10/19  Hepatitis B Surface Antibody  (<10)  Date Hepatitis B Surface Antibody Drawn 02/10/19  Hemodialysis Consent Verified Yes  Hemodialysis Standing Orders Initiated Yes  ECG (Telemetry) Monitor On Yes  Prime Ordered Normal Saline  Length of  DialysisTreatment -hour(s) 3 Hour(s)  Dialysis Treatment Comments Na 140  Dialyzer Elisio 17H NR  Dialysate 3K;2.5 Ca  Dialysis Anticoagulant None  Dialysate Flow Ordered 600  Blood Flow Rate Ordered 400 mL/min  Ultrafiltration Goal 0.5 Liters  Pre Treatment Labs CBC  Dialysis Blood Pressure Support Ordered  Normal Saline  During Hemodialysis Assessment  Blood Flow Rate (mL/min) 400 mL/min  Arterial Pressure (mmHg) -170 mmHg  Venous Pressure (mmHg) 130 mmHg  Transmembrane Pressure (mmHg) 50 mmHg  Ultrafiltration Rate (mL/min) 340 mL/min  Dialysate Flow Rate (mL/min) 600 ml/min  Conductivity: Machine  14  HD Safety Checks Performed Yes  Dialysis Fluid Bolus Normal Saline  Bolus Amount (mL) 250 mL  Intra-Hemodialysis Comments Tx initiated  Education / Care Plan  Dialysis Education Provided No (Comment)  Documented Education in Care Plan  (Pt non interactive )  Hemodialysis Catheter Left Internal jugular Double lumen Permanent (Tunneled)  Placement Date/Time: 02/23/19 1514   Time Out: Correct patient;Correct site;Correct procedure  Maximum sterile barrier precautions: Hand hygiene;Cap;Mask;Sterile gown;Sterile gloves;Large sterile sheet  Site Prep: Chlorhexidine (preferred)  Local Anes...  Site Condition No complications  Blue Lumen Status Blood return noted  Red Lumen Status Blood return noted  Purple Lumen Status N/A  Dressing Type Biopatch;Occlusive  Dressing Status Clean;Dry;Intact  Drainage Description None  Dressing Change Due 03/09/19

## 2019-03-06 NOTE — Consult Note (Signed)
ANTICOAGULATION CONSULT NOTE  Pharmacy Consult for heparin Indication: DVT  Patient Measurements: Height: 5' 4.02" (162.6 cm) Weight: 166 lb 7.2 oz (75.5 kg) IBW/kg (Calculated) : 54.74 Heparin Dosing Weight: 71 kg  Vital Signs: Temp: 97.6 F (36.4 C) (01/01 0000) Temp Source: Axillary (01/01 0000) BP: 109/43 (01/01 0100) Pulse Rate: 55 (01/01 0100)  Labs: Recent Labs    03/03/19 0636 03/03/19 2100 03/04/19 0417 03/05/19 0356 03/05/19 2055 03/06/19 0345  HGB 8.9*   < > 8.1* 8.6*  --  6.8*  HCT 26.3*   < > 24.4* 27.2*  --  21.7*  PLT 172   < > 196 225  --  171  HEPARINUNFRC 0.22*  --   --   --  0.32 0.34  CREATININE 2.44*  --  3.14* 2.16*  --  2.63*   < > = values in this interval not displayed.   Estimated Creatinine Clearance: 21.2 mL/min (A) (by C-G formula based on SCr of 2.63 mg/dL (H)).  Medical History: Past Medical History:  Diagnosis Date  . Anemia   . Chronic diastolic CHF (congestive heart failure) ([place])    a. Due to ischemic cardiomyopathy. EF as low as 35%, improved to normal s/p CABG; b. echo 07/06/13: EF 55-60%, no RWMAs, mod TR, trivial pericardial effusion not c/w tamponade physiology;  c. 10/2015 Echo: EF 65%, Gr1 DD, triv AI, mild MR, mildly dil LA, mod TR, PASP 35mmHg.  Marland Kitchen Coronary artery disease    a. NSTEMI 06/2013; b.cath: severe three-vessel CAD w/ EF 30% & mild-mod MR; c. s/p 3 vessel CABG 07/02/13 (LIMA-LAD, SVG-OM, and SVG-RPDA);  d. 10/2015 MV: no ischemia/infarct.  . Diabetes mellitus without complication ([place])   . Diabetic neuropathy ([place])   . Dialysis patient [facility])    MWF  . ESRD (end stage renal disease) ([place])    a. 12/2015 initiated - mwf dialysis.  Marland Kitchen GERD (gastroesophageal reflux disease)   . Hyperlipidemia   . Hypertension   . Hypothyroidism   . Myocardial infarction ([place]) 2015  . Neuropathy   . Pleural effusion 2015  . Pulmonary hypertension ([place])   . Renal insufficiency   . Wears dentures    full lower    Medications:   Scheduled:  . aspirin  81 mg Oral Daily  . B-complex with vitamin C  1 tablet Per Tube Daily  . Chlorhexidine Gluconate Cloth  6 each Topical Q0600  . epoetin (EPOGEN/PROCRIT) injection  4,000 Units Intravenous Q M,W,F-HD  . feeding supplement (PRO-STAT SUGAR FREE 64)  30 mL Per Tube Daily  . fluticasone  2 spray Each Nare Daily  . free water  30 mL Per Tube Q4H  . insulin aspart  0-15 Units Subcutaneous Q4H  . levothyroxine  88 mcg Oral QAC breakfast  . mouth rinse  15 mL Mouth Rinse BID  . midodrine  10 mg Oral TID WC  . pantoprazole (PROTONIX) IV  40 mg Intravenous Q24H  . venlafaxine  75 mg Oral BID WC  . zinc sulfate  220 mg Oral Daily    Assessment: 64 y.o. female admitted on 02/09/2019 with MRSA  bacteremia. She was on apixaban PTA but last dose was prior to admission (12/7). [place] 12/15 revealed age-indeterminate short segment occlusive DVT involving the mid humeral aspect of one of the paired brachial veins.  Heparin was started but after repeated attempts by multiple phlebotomists lab was unable to draw labs required for heparin monitoring. Additionally, she was too confused and disoriented to swallow  oral medications. On 12/20 a femoral central line was placed.    Heparin Course: 12/24 0231 HL 0.26: held due to unwitnessed fall and bleeding from arm 12/25 0413 HL 0.28: subtherapeutic - CBC stable.  Will increase Heparin to 900 units/hr and recheck in 8 hours. 12/25 1356 HL 0.47: therapeutic  12/25 2020 HL 0.36: therapeutic x 2 12/26 HL @0626 = 0.16. Will give Heparin bolus of 2100 units and increase heparin drip to 1100 units/hr until INR is ?2 for at least 2 measurements taken ~24 hours apart 12/30  12/26-12/28 HL therapeutic at 1100 units/hour 12/29 Stopped for PEG 12/30 AVG Excision.  Does not apear heparin was restarted.  Goal of Therapy:  Heparin Level: 0.3 - 0.7 units/mL Monitor platelets by anticoagulation protocol: Yes   Plan:  01/01 @ 0400 HL 0.34 therapeutic.  Will continue current rate and will recheck HL w/ am labs, hgb dropped to 6.3 from 8.6, per RN patient not having any issues bleeding, will continue to monitor.  Tobie Lords, PharmD, BCPS Clinical Pharmacist 03/06/2019 5:19 AM

## 2019-03-06 NOTE — Progress Notes (Signed)
Pharmacy Electrolyte Monitoring Consult:  Pharmacy consulted to assist in monitoring and replacing electrolytes in this 64 y.o. female admitted on 02/09/2019. Patient with ESRD. Patient with MRSA bacteremia. She has a h/o CAD and is s/p CABG. Hemodialysis MWF.   Labs:  Sodium (mmol/L)  Date Value  03/06/2019 134 (L)  03/30/2014 140   Potassium (mmol/L)  Date Value  03/06/2019 3.9  03/30/2014 4.3   Magnesium (mg/dL)  Date Value  03/06/2019 2.1  07/19/2013 1.7 (L)   Phosphorus (mg/dL)  Date Value  03/06/2019 3.5   Calcium (mg/dL)  Date Value  03/06/2019 8.0 (L)   Calcium, Total (mg/dL)  Date Value  03/30/2014 9.0   Albumin (g/dL)  Date Value  03/05/2019 1.9 (L)  02/11/2018 4.2  07/23/2013 2.7 (L)   Corrected Ca: 9.8 mg/dL  Goals of Therapy Given Cardiac History: Potassium 4.0 - 5.1 mmol/L Magnesium 2.0 - 2.4 mg/dL All Other Electrolytes WNL   Plan: No replacement at this time. F/u with am labs  Pharmacy will continue to monitor and adjust per consult.   Chinita Greenland PharmD Clinical Pharmacist 03/06/2019

## 2019-03-06 NOTE — Progress Notes (Signed)
Post HD Assessment   03/06/19 1830  Neurological  Level of Consciousness Responds to Voice  Orientation Level Oriented to person  Respiratory  Respiratory Pattern Regular;Unlabored  Chest Assessment Chest expansion symmetrical  Bilateral Breath Sounds Diminished  Cough None  Cardiac  Pulse Irregular  Heart Sounds S1, S2  ECG Monitor Yes  Cardiac Rhythm ST  Vascular  R Radial Pulse +1  L Radial Pulse +1  Edema Generalized  Generalized Edema +2

## 2019-03-06 NOTE — Progress Notes (Signed)
Post HD Tx    03/06/19 1825  Hand-Off documentation  Report given to (Full Name) Charyl Bigger, RN   Report received from (Full Name) Beatris Ship, RN   Vital Signs  Pulse Rate (!) 104  Resp (!) 21  BP (!) 107/56  BP Location Left Arm  BP Method Automatic  Patient Position (if appropriate) Lying  Oxygen Therapy  SpO2 98 %  O2 Device Room Air  Pain Assessment  Pain Scale 0-10  Pain Score 0  Post-Hemodialysis Assessment  Rinseback Volume (mL) 250 mL  KECN 66.1 V  Dialyzer Clearance Clear  Duration of HD Treatment -hour(s) 3 hour(s)  Hemodialysis Intake (mL) 750 mL  UF Total -Machine (mL) 1250 mL  Net UF (mL) 500 mL  Tolerated HD Treatment Yes  Hemodialysis Catheter Left Internal jugular Double lumen Permanent (Tunneled)  Placement Date/Time: 02/23/19 1514   Time Out: Correct patient;Correct site;Correct procedure  Maximum sterile barrier precautions: Hand hygiene;Cap;Mask;Sterile gown;Sterile gloves;Large sterile sheet  Site Prep: Chlorhexidine (preferred)  Local Anes...  Site Condition No complications  Blue Lumen Status Heparin locked  Red Lumen Status Heparin locked  Post treatment catheter status Capped and Clamped

## 2019-03-06 NOTE — Progress Notes (Signed)
[facility]  ROUNDING NOTE   Subjective:   Patient is critically ill Patient underwent AVG excision   Extubated now Able to follow commands   Objective:  Vital signs in last 24 hours:  Temp:  [97.6 F (36.4 C)-99 F (37.2 C)] 99 F (37.2 C) (01/01 1118) Pulse Rate:  [51-91] 84 (01/01 1118) Resp:  [0-21] 20 (01/01 1200) BP: (93-144)/(32-74) 105/54 (01/01 1200) SpO2:  [93 %-100 %] 97 % (01/01 1118)  Weight change:  Filed Weights   02/25/19 0842 02/25/19 1200 03/02/19 0930  Weight: 74.1 kg 73.2 kg 75.5 kg    Intake/Output: I/O last 3 completed shifts: In: 2641.1 [I.V.:1091; Other:210; NG/GT:1190; IV Piggyback:150.1] Out: 1175 [Drains:50; Other:500; Stool:625]   Intake/Output this shift:  No intake/output data recorded.  Physical Exam: General: NAD, laying in bed  HEENT  moist oral mucus membranes.  Lungs:   coarse breath sounds  Heart: Regular rate and rhythm  Abdomen:  Soft, nontender, PEG tube in place  Extremities: + peripheral edema b/l, feet in b/l soft support  Neurologic:  able to follow commands although slow to respond  Access:  Left IJ PC  Rectal rube in place Rt arm wound vac  Basic Metabolic Panel: Recent Labs  Lab 03/02/19 0329 03/03/19 0636 03/03/19 2100 03/04/19 0417 03/05/19 0356 03/06/19 0345  NA 134* 139 138 138 137 134*  K 4.4 4.0 4.0 4.2 3.6 3.9  CL 96* 101 100 101 99 100  CO2 22 26 28 28 26 25   GLUCOSE 130* 125* 74 136* 102* 206*  BUN 28* 19 22 24* 14 23  CREATININE 3.61* 2.44* 2.95* 3.14* 2.16* 2.63*  CALCIUM 8.3* 8.3* 8.4* 8.5* 8.5* 8.0*  MG 1.9 2.0  --  2.1 1.7 2.1  PHOS 2.9 1.8*  --  3.7 2.8 3.5    Liver Function Tests: Recent Labs  Lab 02/28/19 0626 03/01/19 0429 03/05/19 0356  AST 22 22 25   ALT 19 17 16   ALKPHOS 63 58 88  BILITOT 1.0 0.9 0.8  PROT 4.9* 4.6* 5.6*  ALBUMIN 2.0* 1.8* 1.9*   No results for input(s): LIPASE, AMYLASE in the last 168 hours. No results for input(s): AMMONIA in the last  168 hours.  CBC: Recent Labs  Lab 03/03/19 0636 03/03/19 2139 03/04/19 0417 03/05/19 0356 03/06/19 0345  WBC 10.0 10.0 6.7 9.7 7.2  HGB 8.9* 8.6* 8.1* 8.6* 6.8*  HCT 26.3* 26.6* 24.4* 27.2* 21.7*  MCV 88.3 93.7 89.7 94.4 94.8  PLT 172 219 196 225 171    Cardiac Enzymes: Recent Labs  Lab 03/02/19 0330  CKTOTAL 31*    BNP: Invalid input(s): POCBNP  CBG: Recent Labs  Lab 03/05/19 1910 03/05/19 2306 03/06/19 0332 03/06/19 0753 03/06/19 1038  GLUCAP 185* 234* 202* 140* 146*    Microbiology: Results for orders placed or performed during the hospital encounter of 02/09/19  Culture, blood (routine x 2)     Status: Abnormal   Collection Time: 02/09/19  2:29 PM   Specimen: BLOOD LEFT ARM  Result Value Ref Range Status   Specimen Description   Final    BLOOD LEFT ARM Performed at [facility], [address], [place], [place] [address]    Special Requests   Final    BOTTLES DRAWN AEROBIC AND ANAEROBIC Blood Culture adequate volume Performed at [facility], [address] [place], [place] [address]    Culture  Setup Time   Final    GRAM POSITIVE COCCI IN BOTH AEROBIC  AND ANAEROBIC BOTTLES CRITICAL VALUE NOTED.  VALUE IS CONSISTENT WITH PREVIOUSLY REPORTED AND CALLED VALUE. Performed at [facility], [address] [place], [place] [address]    Culture (A)  Final    STAPHYLOCOCCUS AUREUS SUSCEPTIBILITIES PERFORMED ON PREVIOUS CULTURE WITHIN THE LAST 5 DAYS. Performed at [facility], [place] [address], [place], [place] [address]    Report Status 02/12/2019 FINAL  Final  Culture, blood (routine x 2)     Status: Abnormal   Collection Time: 02/09/19  2:38 PM   Specimen: BLOOD LEFT ARM  Result Value Ref Range Status   Specimen Description   Final    BLOOD LEFT ARM Performed at [facility], [address], [place], [place] [address]    Special Requests   Final    BOTTLES DRAWN AEROBIC AND ANAEROBIC Blood Culture  adequate volume Performed at [facility], [address], [place], [place] [address]    Culture  Setup Time   Final    GRAM POSITIVE COCCI IN BOTH AEROBIC AND ANAEROBIC BOTTLES CRITICAL RESULT CALLED TO, READ BACK BY AND VERIFIED WITH: [place] [id number] 02/10/2019 HNM Performed at [facility], [place] [address] [place], [place] [address]    Culture METHICILLIN RESISTANT STAPHYLOCOCCUS AUREUS (A)  Final   Report Status 02/12/2019 FINAL  Final   Organism ID, Bacteria METHICILLIN RESISTANT STAPHYLOCOCCUS AUREUS  Final      Susceptibility   Methicillin resistant staphylococcus aureus - MIC*    CIPROFLOXACIN >=8 RESISTANT Resistant     ERYTHROMYCIN >=8 RESISTANT Resistant     GENTAMICIN <=0.5 SENSITIVE Sensitive     OXACILLIN >=4 RESISTANT Resistant     TETRACYCLINE <=1 SENSITIVE Sensitive     VANCOMYCIN 1 SENSITIVE Sensitive     TRIMETH/SULFA <=10 SENSITIVE Sensitive     CLINDAMYCIN RESISTANT Resistant     RIFAMPIN <=0.5 SENSITIVE Sensitive     Inducible Clindamycin POSITIVE Resistant     * METHICILLIN RESISTANT STAPHYLOCOCCUS AUREUS  Blood Culture ID Panel (Reflexed)     Status: Abnormal   Collection Time: 02/09/19  2:38 PM  Result Value Ref Range Status   Enterococcus species NOT DETECTED NOT DETECTED Final   Listeria monocytogenes NOT DETECTED NOT DETECTED Final   Staphylococcus species DETECTED (A) NOT DETECTED Final    Comment: CRITICAL RESULT CALLED TO, READ BACK BY AND VERIFIED WITH: Hart Robinsons PHARMD [id number] 02/10/2019 HNM    Staphylococcus aureus (BCID) DETECTED (A) NOT DETECTED Final    Comment: Methicillin (oxacillin)-resistant Staphylococcus aureus (MRSA). MRSA is predictably resistant to beta-lactam antibiotics (except ceftaroline). Preferred therapy is vancomycin unless clinically contraindicated. Patient requires contact precautions if  hospitalized. CRITICAL RESULT CALLED TO, READ BACK BY AND VERIFIED WITH: Hart Robinsons PHARMD [id number] 02/10/2019 HNM     Methicillin resistance DETECTED (A) NOT DETECTED Final    Comment: CRITICAL RESULT CALLED TO, READ BACK BY AND VERIFIED WITH: Hart Robinsons PHARMD [id number] 02/10/2019 HNM    Streptococcus species NOT DETECTED NOT DETECTED Final   Streptococcus agalactiae NOT DETECTED NOT DETECTED Final   Streptococcus pneumoniae NOT DETECTED NOT DETECTED Final   Streptococcus pyogenes NOT DETECTED NOT DETECTED Final   Acinetobacter baumannii NOT DETECTED NOT DETECTED Final   Enterobacteriaceae species NOT DETECTED NOT DETECTED Final   Enterobacter cloacae complex NOT DETECTED NOT DETECTED Final   Escherichia coli NOT DETECTED NOT DETECTED Final   Klebsiella oxytoca NOT DETECTED NOT DETECTED Final   Klebsiella pneumoniae NOT DETECTED NOT DETECTED Final  Proteus species NOT DETECTED NOT DETECTED Final   Serratia marcescens NOT DETECTED NOT DETECTED Final   Haemophilus influenzae NOT DETECTED NOT DETECTED Final   Neisseria meningitidis NOT DETECTED NOT DETECTED Final   Pseudomonas aeruginosa NOT DETECTED NOT DETECTED Final   Candida albicans NOT DETECTED NOT DETECTED Final   Candida glabrata NOT DETECTED NOT DETECTED Final   Candida krusei NOT DETECTED NOT DETECTED Final   Candida parapsilosis NOT DETECTED NOT DETECTED Final   Candida tropicalis NOT DETECTED NOT DETECTED Final    Comment: Performed at [facility], [place], [place] [address]  SARS CORONAVIRUS 2 (TAT 6-24 HRS) Nasopharyngeal Nasopharyngeal Swab     Status: None   Collection Time: 02/09/19  2:54 PM   Specimen: Nasopharyngeal Swab  Result Value Ref Range Status   SARS Coronavirus 2 NEGATIVE NEGATIVE Final    Comment: (NOTE) SARS-CoV-2 target nucleic acids are NOT DETECTED. The SARS-CoV-2 RNA is generally detectable in upper and lower respiratory specimens during the acute phase of infection. Negative results do not preclude SARS-CoV-2 infection, do not rule out co-infections with other pathogens, and should not be used  as the sole basis for treatment or other patient management decisions. Negative results must be combined with clinical observations, patient history, and epidemiological information. The expected result is Negative. Fact Sheet for Patients: SugarRoll.be Fact Sheet for Healthcare Providers: https://www.woods-mathews.com/ This test is not yet approved or cleared by the [place] FDA and  has been authorized for detection and/or diagnosis of SARS-CoV-2 by FDA under an Emergency Use Authorization (EUA). This EUA will remain  in effect (meaning this test can be used) for the duration of the COVID-19 declaration under Section 56 4(b)(1) of the Act, 21 U.S.C. section 360bbb-3(b)(1), unless the authorization is terminated or revoked sooner. Performed at [facility], [place] [address] [place], [place] [address]   MRSA PCR Screening     Status: Abnormal   Collection Time: 02/10/19  5:03 PM   Specimen: Nasopharyngeal  Result Value Ref Range Status   MRSA by PCR POSITIVE (A) NEGATIVE Final    Comment:        The GeneXpert MRSA Assay (FDA approved for NASAL specimens only), is one component of a comprehensive MRSA colonization surveillance program. It is not intended to diagnose MRSA infection nor to guide or monitor treatment for MRSA infections. RESULT CALLED TO, READ BACK BY AND VERIFIED WITH: ALEKSEY FRASER @1839  02/10/19 MJU Performed at [facility], [address] [place], [place] [address]   Culture, blood (routine x 2)     Status: Abnormal   Collection Time: 02/11/19 12:31 PM   Specimen: BLOOD  Result Value Ref Range Status   Specimen Description   Final    BLOOD PORTA CATH Performed at [place] [address], [place], [place] [address]    Special Requests   Final    BOTTLES DRAWN AEROBIC AND ANAEROBIC Blood Culture adequate volume Performed at [facility], [address] [place],  [place] [address]    Culture  Setup Time   Final    GRAM POSITIVE COCCI ANAEROBIC BOTTLE ONLY CRITICAL RESULT CALLED TO, READ BACK BY AND VERIFIED WITH: SCOTT HALL AT [id number] 02/12/2019 [facility]  Performed at [facility], [address] [place], [place] [address]    Culture (A)  Final    STAPHYLOCOCCUS AUREUS SUSCEPTIBILITIES PERFORMED ON PREVIOUS CULTURE WITHIN THE LAST 5 DAYS. Performed at [facility], [address] [place], [place] [address]    Report Status 02/13/2019 FINAL  Final  Culture, blood (routine x 2)     Status: None   Collection Time: 02/11/19  3:57 PM   Specimen: BLOOD  Result Value Ref Range Status   Specimen Description BLOOD PORTA CATH  Final   Special Requests   Final    BOTTLES DRAWN AEROBIC AND ANAEROBIC Blood Culture adequate volume   Culture   Final    NO GROWTH 5 DAYS Performed at [facility], [address] [place], [place] [address]    Report Status 02/16/2019 FINAL  Final  CULTURE, BLOOD (ROUTINE X 2) w Reflex to ID Panel     Status: Abnormal   Collection Time: 02/13/19  2:18 PM   Specimen: BLOOD LEFT HAND  Result Value Ref Range Status   Specimen Description BLOOD LEFT HAND  Final   Special Requests   Final    BOTTLES DRAWN AEROBIC AND ANAEROBIC Blood Culture results may not be optimal due to an inadequate volume of blood received in culture bottles   Culture  Setup Time   Final    AEROBIC BOTTLE ONLY GRAM POSITIVE COCCI IN CLUSTERS CRITICAL RESULT CALLED TO, READ BACK BY AND VERIFIED WITH: Herbert Pun AT [id number] ON 02/14/2019 [place]. Performed at [facility], [place] [address], [place], [place] [address]    Culture METHICILLIN RESISTANT STAPHYLOCOCCUS AUREUS (A)  Final   Report Status 02/16/2019 FINAL  Final   Organism ID, Bacteria METHICILLIN RESISTANT STAPHYLOCOCCUS AUREUS  Final      Susceptibility   Methicillin resistant staphylococcus aureus - MIC*    CIPROFLOXACIN >=8 RESISTANT Resistant     ERYTHROMYCIN >=8 RESISTANT  Resistant     GENTAMICIN <=0.5 SENSITIVE Sensitive     OXACILLIN >=4 RESISTANT Resistant     TETRACYCLINE <=1 SENSITIVE Sensitive     VANCOMYCIN <=0.5 SENSITIVE Sensitive     TRIMETH/SULFA <=10 SENSITIVE Sensitive     CLINDAMYCIN RESISTANT Resistant     RIFAMPIN <=0.5 SENSITIVE Sensitive     Inducible Clindamycin POSITIVE Resistant     * METHICILLIN RESISTANT STAPHYLOCOCCUS AUREUS  Blood Culture ID Panel (Reflexed)     Status: Abnormal   Collection Time: 02/13/19  2:18 PM  Result Value Ref Range Status   Enterococcus species NOT DETECTED NOT DETECTED Final   Listeria monocytogenes NOT DETECTED NOT DETECTED Final   Staphylococcus species DETECTED (A) NOT DETECTED Final    Comment: CRITICAL RESULT CALLED TO, READ BACK BY AND VERIFIED WITH: ABBEY ELLINGTON AT [id number] ON 02/14/2019 [place].    Staphylococcus aureus (BCID) DETECTED (A) NOT DETECTED Final    Comment: Methicillin (oxacillin)-resistant Staphylococcus aureus (MRSA). MRSA is predictably resistant to beta-lactam antibiotics (except ceftaroline). Preferred therapy is vancomycin unless clinically contraindicated. Patient requires contact precautions if  hospitalized. CRITICAL RESULT CALLED TO, READ BACK BY AND VERIFIED WITH: Herbert Pun AT [id number] ON 02/14/2019 [place].    Methicillin resistance DETECTED (A) NOT DETECTED Final    Comment: CRITICAL RESULT CALLED TO, READ BACK BY AND VERIFIED WITH: Herbert Pun AT [id number] ON 02/14/2019 [place].    Streptococcus species NOT DETECTED NOT DETECTED Final   Streptococcus agalactiae NOT DETECTED NOT DETECTED Final   Streptococcus pneumoniae NOT DETECTED NOT DETECTED Final   Streptococcus pyogenes NOT DETECTED NOT DETECTED Final   Acinetobacter baumannii NOT DETECTED NOT DETECTED Final   Enterobacteriaceae species NOT DETECTED NOT DETECTED Final   Enterobacter cloacae complex NOT DETECTED NOT DETECTED Final  Escherichia coli NOT DETECTED NOT DETECTED Final   Klebsiella oxytoca NOT DETECTED NOT  DETECTED Final   Klebsiella pneumoniae NOT DETECTED NOT DETECTED Final   Proteus species NOT DETECTED NOT DETECTED Final   Serratia marcescens NOT DETECTED NOT DETECTED Final   Haemophilus influenzae NOT DETECTED NOT DETECTED Final   Neisseria meningitidis NOT DETECTED NOT DETECTED Final   Pseudomonas aeruginosa NOT DETECTED NOT DETECTED Final   Candida albicans NOT DETECTED NOT DETECTED Final   Candida glabrata NOT DETECTED NOT DETECTED Final   Candida krusei NOT DETECTED NOT DETECTED Final   Candida parapsilosis NOT DETECTED NOT DETECTED Final   Candida tropicalis NOT DETECTED NOT DETECTED Final    Comment: Performed at [facility], [address] [place], [place] [address]  CULTURE, BLOOD (ROUTINE X 2) w Reflex to ID Panel     Status: None   Collection Time: 02/13/19  3:09 PM   Specimen: BLOOD  Result Value Ref Range Status   Specimen Description BLOOD BLOOD LEFT HAND  Final   Special Requests   Final    BOTTLES DRAWN AEROBIC AND ANAEROBIC Blood Culture adequate volume   Culture   Final    NO GROWTH 5 DAYS Performed at [facility], [address], [place], [place] [address]    Report Status 02/18/2019 FINAL  Final  Culture, blood (routine x 2)     Status: Abnormal   Collection Time: 02/15/19 12:49 PM   Specimen: BLOOD  Result Value Ref Range Status   Specimen Description   Final    BLOOD LT HAND Performed at [facility], [address] [place], [place] [address]    Special Requests   Final    BOTTLES DRAWN AEROBIC AND ANAEROBIC Blood Culture results may not be optimal due to an inadequate volume of blood received in culture bottles Performed at [facility], [address] [place], [place] [address]    Culture  Setup Time   Final    GRAM POSITIVE COCCI IN BOTH AEROBIC AND ANAEROBIC BOTTLES CRITICAL RESULT CALLED TO, READ BACK BY AND VERIFIED WITHEleonore Chiquito AT [id number] 02/16/2019 SDR Performed at [name], [address] [place], [place] [address]    Culture (A)  Final    METHICILLIN RESISTANT STAPHYLOCOCCUS AUREUS STAPHYLOCOCCUS SPECIES (COAGULASE NEGATIVE) THE SIGNIFICANCE OF ISOLATING THIS ORGANISM FROM A SINGLE SET OF BLOOD CULTURES WHEN MULTIPLE SETS ARE DRAWN IS UNCERTAIN. PLEASE NOTIFY THE MICROBIOLOGY DEPARTMENT WITHIN ONE WEEK IF SPECIATION AND SENSITIVITIES ARE REQUIRED. Performed at [facility], [place] [address], [place], [place] [address]    Report Status 02/18/2019 FINAL  Final   Organism ID, Bacteria METHICILLIN RESISTANT STAPHYLOCOCCUS AUREUS  Final      Susceptibility   Methicillin resistant staphylococcus aureus - MIC*    CIPROFLOXACIN >=8 RESISTANT Resistant     ERYTHROMYCIN >=8 RESISTANT Resistant     GENTAMICIN <=0.5 SENSITIVE Sensitive     OXACILLIN >=4 RESISTANT Resistant     TETRACYCLINE <=1 SENSITIVE Sensitive     VANCOMYCIN <=0.5 SENSITIVE Sensitive     TRIMETH/SULFA <=10 SENSITIVE Sensitive     CLINDAMYCIN RESISTANT Resistant     RIFAMPIN <=0.5 SENSITIVE Sensitive     Inducible Clindamycin POSITIVE Resistant     * METHICILLIN RESISTANT STAPHYLOCOCCUS AUREUS  Aerobic Culture (superficial specimen)     Status: None   Collection Time: 02/17/19  1:31 PM   Specimen: Heel; Wound  Result Value Ref Range Status   Specimen  Description   Final    HEEL Performed at [facility], [address] [place], [place] [address]    Special Requests   Final    NONE Performed at [facility], [place], [place] [address]    Gram Stain   Final    NO WBC SEEN MODERATE GRAM POSITIVE COCCI IN PAIRS    Culture   Final    ABUNDANT METHICILLIN RESISTANT STAPHYLOCOCCUS AUREUS ABUNDANT DIPHTHEROIDS(CORYNEBACTERIUM SPECIES) Standardized susceptibility testing for this organism is not available. Performed at [facility], [place] [address], [place], [place] [address]    Report Status 02/19/2019 FINAL  Final   Organism ID, Bacteria  METHICILLIN RESISTANT STAPHYLOCOCCUS AUREUS  Final      Susceptibility   Methicillin resistant staphylococcus aureus - MIC*    CIPROFLOXACIN >=8 RESISTANT Resistant     ERYTHROMYCIN >=8 RESISTANT Resistant     GENTAMICIN <=0.5 SENSITIVE Sensitive     OXACILLIN >=4 RESISTANT Resistant     TETRACYCLINE <=1 SENSITIVE Sensitive     VANCOMYCIN 1 SENSITIVE Sensitive     TRIMETH/SULFA <=10 SENSITIVE Sensitive     CLINDAMYCIN RESISTANT Resistant     RIFAMPIN <=0.5 SENSITIVE Sensitive     Inducible Clindamycin POSITIVE Resistant     * ABUNDANT METHICILLIN RESISTANT STAPHYLOCOCCUS AUREUS  CULTURE, BLOOD (ROUTINE X 2) w Reflex to ID Panel     Status: None   Collection Time: 02/18/19 11:29 PM   Specimen: BLOOD  Result Value Ref Range Status   Specimen Description BLOOD LEFT ARM  Final   Special Requests   Final    BOTTLES DRAWN AEROBIC AND ANAEROBIC Blood Culture adequate volume   Culture   Final    NO GROWTH 5 DAYS Performed at [facility], [address] [place], [place] [address]    Report Status 02/23/2019 FINAL  Final  CULTURE, BLOOD (ROUTINE X 2) w Reflex to ID Panel     Status: None   Collection Time: 02/18/19 11:29 PM   Specimen: BLOOD  Result Value Ref Range Status   Specimen Description BLOOD LEFT ARM  Final   Special Requests   Final    BOTTLES DRAWN AEROBIC AND ANAEROBIC Blood Culture adequate volume   Culture   Final    NO GROWTH 5 DAYS Performed at [facility], [address] [place], [place] [address]    Report Status 02/23/2019 FINAL  Final  Aerobic/Anaerobic Culture (surgical/deep wound)     Status: None (Preliminary result)   Collection Time: 03/04/19 11:04 AM   Specimen: PATH Other; Tissue  Result Value Ref Range Status   Specimen Description   Final    ARTERY Performed at [facility], [address] [place], [place] [address]    Special Requests   Final    RIGHT AV GRAFT AND STENTS CULTURE Performed at [facility],  [address] [place], [place] [address]    Gram Stain   Final    RARE WBC PRESENT, PREDOMINANTLY PMN NO ORGANISMS SEEN    Culture   Final    NO GROWTH 2 DAYS NO ANAEROBES ISOLATED; CULTURE IN PROGRESS FOR 5 DAYS Performed at [place] [address] [place], [place] [address]    Report Status PENDING  Incomplete  Aerobic/Anaerobic Culture (surgical/deep wound)     Status: None (Preliminary result)   Collection Time: 03/04/19 11:29 AM   Specimen: PATH Other; Tissue  Result Value Ref Range Status  Specimen Description   Final    ARTERY Performed at [facility], [address], [place], [place] [address]    Special Requests   Final    FLUID AROUND AV GRAFT Performed at [facility], [address] [place], [place] [address]    Gram Stain   Final    FEW WBC PRESENT, PREDOMINANTLY PMN NO ORGANISMS SEEN    Culture   Final    NO GROWTH 2 DAYS NO ANAEROBES ISOLATED; CULTURE IN PROGRESS FOR 5 DAYS Performed at [place] [address], [place], [place] [address]    Report Status PENDING  Incomplete    Coagulation Studies: No results for input(s): LABPROT, INR in the last 72 hours.  Urinalysis: No results for input(s): COLORURINE, LABSPEC, PHURINE, GLUCOSEU, HGBUR, BILIRUBINUR, KETONESUR, PROTEINUR, UROBILINOGEN, NITRITE, LEUKOCYTESUR in the last 72 hours.  Invalid input(s): APPERANCEUR    Imaging: No results found.   Medications:   . sodium chloride Stopped (03/01/19 0429)  . sodium chloride    . feeding supplement (OSMOLITE 1.5 CAL) 50 mL/hr at 03/05/19 1900  . heparin 1,100 Units/hr (03/06/19 1110)  . norepinephrine (LEVOPHED) Adult infusion Stopped (03/05/19 0401)  . vancomycin Stopped (03/02/19 1752)   . sterile water (preservative free)      . aspirin  81 mg Oral Daily  . B-complex with vitamin C  1 tablet Per Tube Daily  . Chlorhexidine Gluconate Cloth  6 each Topical Q0600  . epoetin (EPOGEN/PROCRIT) injection   4,000 Units Intravenous Q M,W,F-HD  . feeding supplement (PRO-STAT SUGAR FREE 64)  30 mL Per Tube Daily  . fluticasone  2 spray Each Nare Daily  . free water  30 mL Per Tube Q4H  . insulin aspart  0-15 Units Subcutaneous Q4H  . levothyroxine  88 mcg Oral QAC breakfast  . mouth rinse  15 mL Mouth Rinse BID  . midodrine  10 mg Oral TID WC  . pantoprazole (PROTONIX) IV  40 mg Intravenous Q24H  . venlafaxine  75 mg Oral BID WC  . zinc sulfate  220 mg Oral Daily   sodium chloride, sodium chloride, ipratropium-albuterol, ondansetron (ZOFRAN) IV  Assessment/ Plan:  [name] is a 64 y.o. white female with end-stage renal disease on hemodialysis, atrial fibrillation, chronic diastolic congestive heart failure, coronary artery disease status post CABG, diabetes mellitus type II, hypertension, hyperlipidemia, hypothyroidism, Covid positive in July 2020, recent nursing home stay in [place]  Prolonged hospitalization for MRSA bacteremia/sepsis and now with acute encephalopathy  [facility] MWF [facility] [address] Left AVG 81.5kg  1. End Stage Renal Disease: on hemodialysis. With complication of dialysis device.  -rt arm AVG removed 12/30 due to suspected source of bacteremia -  MWF dialysis schedule.  - We will plan for hemodialysis today.   2. Hypotension - midodrine 10 mg TID,   3. Anemia of chronic kidney disease:   Status post PRBC transfusion on 12/22 - EPO with HD treatments.   4. Secondary Hyperparathyroidism:  not currently on binders.  Lab Results  Component Value Date   PTH 135 (H) 02/22/2017   CALCIUM 8.0 (L) 03/06/2019   CAION 1.20 07/03/2013   PHOS 3.5 03/06/2019    5. Acute Encephalopathy:  - mental status improved today, after AVG removal   6. Sepsis from MRSA - Abx as per ID team - Source of MRSA has not been identified -AV graft  removed 12/30 Current treatment with iv VANC  7. A Fib -  requiring Anticoagulation. heparin Drip,  Eliquis on Hold  8. DM with  CKD Insulin dependent Lab Results  Component Value Date   HGBA1C 5.3 02/09/2019   9. Left arm DVT Dx 12/9  10.  Acute respiratory failure Extubated 12/31     LOS: 25 Jahid Weida 1/1/202112:34 PM

## 2019-03-06 NOTE — Progress Notes (Signed)
Pre HD Assessment    03/06/19 1500  Neurological  Level of Consciousness Responds to Voice  Orientation Level Oriented to person  Respiratory  Respiratory Pattern Regular;Unlabored  Chest Assessment Chest expansion symmetrical  Bilateral Breath Sounds Diminished  Cough Non-productive  Cardiac  Pulse Irregular  Heart Sounds S1, S2  ECG Monitor Yes  Cardiac Rhythm ST  Vascular  R Radial Pulse +1  L Radial Pulse +1  Edema Generalized  Generalized Edema +2

## 2019-03-07 LAB — BASIC METABOLIC PANEL
Anion gap: 11 (ref 5–15)
BUN: 19 mg/dL (ref 8–23)
CO2: 26 mmol/L (ref 22–32)
Calcium: 8.2 mg/dL — ABNORMAL LOW (ref 8.9–10.3)
Chloride: 99 mmol/L (ref 98–111)
Creatinine, Ser: 1.88 mg/dL — ABNORMAL HIGH (ref 0.44–1.00)
GFR calc Af Amer: 32 mL/min — ABNORMAL LOW (ref >60)
GFR calc non Af Amer: 28 mL/min — ABNORMAL LOW (ref >60)
Glucose, Bld: 191 mg/dL — ABNORMAL HIGH (ref 70–99)
Potassium: 3.7 mmol/L (ref 3.5–5.1)
Sodium: 136 mmol/L (ref 135–145)

## 2019-03-07 LAB — GLUCOSE, CAPILLARY
Glucose-Capillary: 109 mg/dL — ABNORMAL HIGH (ref 70–99)
Glucose-Capillary: 131 mg/dL — ABNORMAL HIGH (ref 70–99)
Glucose-Capillary: 163 mg/dL — ABNORMAL HIGH (ref 70–99)
Glucose-Capillary: 171 mg/dL — ABNORMAL HIGH (ref 70–99)
Glucose-Capillary: 181 mg/dL — ABNORMAL HIGH (ref 70–99)
Glucose-Capillary: 213 mg/dL — ABNORMAL HIGH (ref 70–99)

## 2019-03-07 LAB — TYPE AND SCREEN
ABO/RH(D): A POS
Antibody Screen: NEGATIVE
Unit division: 0

## 2019-03-07 LAB — CBC
HCT: 22.1 % — ABNORMAL LOW (ref 36.0–46.0)
Hemoglobin: 7.5 g/dL — ABNORMAL LOW (ref 12.0–15.0)
MCH: 29.5 pg (ref 26.0–34.0)
MCHC: 33.9 g/dL (ref 30.0–36.0)
MCV: 87 fL (ref 80.0–100.0)
Platelets: 173 10*3/uL (ref 150–400)
RBC: 2.54 MIL/uL — ABNORMAL LOW (ref 3.87–5.11)
RDW: 20.2 % — ABNORMAL HIGH (ref 11.5–15.5)
WBC: 8.9 10*3/uL (ref 4.0–10.5)
nRBC: 0 % (ref 0.0–0.2)

## 2019-03-07 LAB — BPAM RBC
Blood Product Expiration Date: 202101022359
ISSUE DATE / TIME: 202101011045
Unit Type and Rh: 9500

## 2019-03-07 LAB — HEPARIN LEVEL (UNFRACTIONATED): Heparin Unfractionated: 0.48 IU/mL (ref 0.30–0.70)

## 2019-03-07 MED ORDER — PANTOPRAZOLE SODIUM 40 MG IV SOLR
40.0000 mg | Freq: Two times a day (BID) | INTRAVENOUS | Status: DC
  Administered 2019-03-07 – 2019-03-29 (×46): 40 mg via INTRAVENOUS
  Filled 2019-03-07 (×47): qty 40

## 2019-03-07 MED ORDER — STERILE WATER FOR INJECTION IJ SOLN
INTRAMUSCULAR | Status: AC
  Administered 2019-03-07: 10 mL
  Filled 2019-03-07: qty 10

## 2019-03-07 MED ORDER — MIDODRINE HCL 5 MG PO TABS: 2.5000 mg | ORAL_TABLET | Freq: Three times a day (TID) | ORAL | Status: DC

## 2019-03-07 NOTE — Progress Notes (Signed)
Pharmacy Electrolyte Monitoring Consult:  Pharmacy consulted to assist in monitoring and replacing electrolytes in this 64 y.o. female admitted on 02/09/2019. Patient with ESRD. Patient with MRSA bacteremia. She has a h/o CAD and is s/p CABG. Hemodialysis MWF.   Labs:  Sodium (mmol/L)  Date Value  03/07/2019 136  03/30/2014 140   Potassium (mmol/L)  Date Value  03/07/2019 3.7  03/30/2014 4.3   Magnesium (mg/dL)  Date Value  03/06/2019 2.1  07/19/2013 1.7 (L)   Phosphorus (mg/dL)  Date Value  03/06/2019 3.5   Calcium (mg/dL)  Date Value  03/07/2019 8.2 (L)   Calcium, Total (mg/dL)  Date Value  03/30/2014 9.0   Albumin (g/dL)  Date Value  03/05/2019 1.9 (L)  02/11/2018 4.2  07/23/2013 2.7 (L)   Corrected Ca: 9.9 mg/dL   Plan: No replacement at this time. F/u with am labs  Pharmacy will continue to monitor and adjust per consult.   Rayna Sexton, PharmD, BCPS Clinical Pharmacist 03/07/2019 7:59 AM

## 2019-03-07 NOTE — Progress Notes (Signed)
[name], female   DOB: 05/21/1955, 64 y.o.   MRN: [id number] [facility] PROGRESS NOTE  JAYONA NORATO [id number] DOB: 10-Jul-1955 DOA: 02/09/2019 PCP: [name], MD  HPI/Subjective: When I went in the room this morning, patient look like she was choking.  Patient opened her eyes and looked at me but did not talk with me today.  Patient's breathing was better after I was in the room for a little while.  Patient was moving her right leg on her own.  Did not move to command today.  Objective: Vitals:   03/07/19 1300 03/07/19 1400  BP: (!) 94/50 (!) 96/53  Pulse: 89 89  Resp: (!) 21 (!) 22  Temp:    SpO2: 98% 100%    Filed Weights   02/25/19 1200 03/02/19 0930 03/06/19 1500  Weight: 73.2 kg 75.5 kg 83 kg    ROS: Review of Systems  Unable to perform ROS: Acuity of condition   Exam: Physical Exam  Constitutional: She appears lethargic.  HENT:  Nose: No mucosal edema.  Eyes: Pupils are equal, round, and reactive to light. Conjunctivae and lids are normal.  Neck: Carotid bruit is not present.  Cardiovascular: Regular rhythm, S1 normal and S2 normal. Exam reveals no gallop.  No murmur heard. Respiratory: No respiratory distress. She has decreased breath sounds in the right lower field and the left lower field. She has no wheezes. She has no rhonchi. She has no rales.  GI: Soft. Bowel sounds are normal. There is no abdominal tenderness.  Musculoskeletal:     Right ankle: No swelling.     Left ankle: No swelling.  Lymphadenopathy:    She has no cervical adenopathy.  Neurological: She appears lethargic.  Did not answer any questions for me this morning  Skin: Skin is warm. Nails show no clubbing.  Left heel stage II decubitus ulcer with black eschar. Right heel stage II decubitus ulcer As per nursing staff stage I decubitus ulcer sacrum Bruising upper extremities Wound VAC over right arm  Psychiatric:  Moved her right leg on her own.   Unresponsive to sternal rub for me.      Data Reviewed: Basic Metabolic Panel: Recent Labs  Lab 03/02/19 0329 03/03/19 0636 03/03/19 2100 03/04/19 0417 03/05/19 0356 03/06/19 0345 03/07/19 0423  NA 134* 139 138 138 137 134* 136  K 4.4 4.0 4.0 4.2 3.6 3.9 3.7  CL 96* 101 100 101 99 100 99  CO2 22 26 28 28 26 25 26   GLUCOSE 130* 125* 74 136* 102* 206* 191*  BUN 28* 19 22 24* 14 23 19   CREATININE 3.61* 2.44* 2.95* 3.14* 2.16* 2.63* 1.88*  CALCIUM 8.3* 8.3* 8.4* 8.5* 8.5* 8.0* 8.2*  MG 1.9 2.0  --  2.1 1.7 2.1  --   PHOS 2.9 1.8*  --  3.7 2.8 3.5  --    Liver Function Tests: Recent Labs  Lab 03/01/19 0429 03/05/19 0356  AST 22 25  ALT 17 16  ALKPHOS 58 88  BILITOT 0.9 0.8  PROT 4.6* 5.6*  ALBUMIN 1.8* 1.9*   CBC: Recent Labs  Lab 03/03/19 2139 03/04/19 0417 03/05/19 0356 03/06/19 0345 03/06/19 1526 03/07/19 0423  WBC 10.0 6.7 9.7 7.2  --  8.9  HGB 8.6* 8.1* 8.6* 6.8* 8.8* 7.5*  HCT 26.6* 24.4* 27.2* 21.7* 25.4* 22.1*  MCV 93.7 89.7 94.4 94.8  --  87.0  PLT 219 196 225 171  --  173   Cardiac  Enzymes: Recent Labs  Lab 03/02/19 0330  CKTOTAL 31*   BNP (last 3 results) Recent Labs    03/01/19 0429  BNP 720.0*     CBG: Recent Labs  Lab 03/06/19 1951 03/06/19 2330 03/07/19 0404 03/07/19 0741 03/07/19 1139  GLUCAP 169* 156* 171* 163* 213*     Studies: MR BRAIN WO CONTRAST  Result Date: 03/06/2019 CLINICAL DATA:  Metabolic encephalopathy. Renal failure. Coronavirus infection. Bacteremia. EXAM: MRI HEAD WITHOUT CONTRAST TECHNIQUE: Multiplanar, multiecho pulse sequences of the brain and surrounding structures were obtained without intravenous contrast. COMPARISON:  Head CT 02/26/2019.  MRI 02/12/2019 FINDINGS: Brain: Diffusion imaging does not show any acute or subacute infarction. Chronic small-vessel ischemic changes affect the pons. No focal cerebellar insult. Cerebral hemispheres show chronic small-vessel ischemic changes of the deep white  matter. No cortical or large vessel territory infarction. No mass lesion, hemorrhage, hydrocephalus or extra-axial collection. Incomplete septum pellucidum, a congenital finding. Vascular: Major vessels at the base of the brain show flow. Skull and upper cervical spine: Negative Sinuses/Orbits: Clear/normal Other: Small bilateral mastoid effusions. IMPRESSION: No acute finding. Chronic small-vessel ischemic changes of the pons and cerebral hemispheric white matter. Electronically Signed   By: Nelson Chimes M.D.   On: 03/06/2019 14:50    Scheduled Meds: . aspirin  81 mg Oral Daily  . B-complex with vitamin C  1 tablet Per Tube Daily  . Chlorhexidine Gluconate Cloth  6 each Topical Q0600  . epoetin (EPOGEN/PROCRIT) injection  4,000 Units Intravenous Q M,W,F-HD  . feeding supplement (PRO-STAT SUGAR FREE 64)  30 mL Per Tube Daily  . fluticasone  2 spray Each Nare Daily  . free water  20 mL Per Tube Q4H  . insulin aspart  0-15 Units Subcutaneous Q4H  . levothyroxine  88 mcg Oral QAC breakfast  . mouth rinse  15 mL Mouth Rinse BID  . midodrine  10 mg Oral TID WC  . pantoprazole (PROTONIX) IV  40 mg Intravenous Q12H  . venlafaxine  75 mg Oral BID WC  . zinc sulfate  220 mg Oral Daily   Continuous Infusions: . sodium chloride Stopped (03/01/19 0429)  . feeding supplement (OSMOLITE 1.5 CAL) 1,000 mL (03/06/19 1525)  . heparin 1,100 Units/hr (03/07/19 1415)  . vancomycin 750 mg (03/06/19 1657)    Assessment/Plan:   1. Sepsis secondary to MRSA bacteremia.  Currently on vancomycin.   Appreciate ID consultation.  Will need 6-week long course of IV therapy.  Vascular removed her graft right arm on 03/04/2019 which could be the source of infection. 2. Hypotension.  Patient taken off Levophed today and transferred to the floor.  Patient on high-dose midodrine.  Blood pressure still on the lower side. 3. Acute blood loss anemia.  Hemoglobin up to 7.5 after transfusion yesterday.  May end up needing  another transfusion tomorrow if hemoglobin drops. 4. End-stage renal disease on hemodialysis.  Tunneled catheter placed by vascular.  Graft removal right arm on 03/04/2019 5. Acute metabolic encephalopathy.  Mental status still impaired but opens her eyes today to my voice.  MRI of the brain negative.  EEG negative.  Ammonia normal.  TSH normal.  In speaking with prior rounding physicians patient's mental status has been impaired for a while now.  Overall prognosis poor.  Patient is little more alert today 6. Left arm DVT.  Continue heparin. 7. Paroxysmal atrial  fibrillation on amiodarone.  Continue heparin 8. Chronic diastolic congestive heart failure.  Dialysis to manage fluid status. 9. Type  2 diabetes mellitus.  On sliding scale.  10. Hypothyroidism unspecified on Synthroid 11. Overall prognosis is poor.  Palliative care following.  Patient a DNR. 12. Diarrhea.  Hold off on stool studies at this time.  13. Present on admission, stage II decubiti right heel, left heel and stage I decubiti on buttock 14. Nutrition: PEG placed 03/03/2019.  Continue tube feeding 15. Acute hypoxic respiratory failure.  Patient intubated for PEG procedure and extubated 03/05/2019.  As needed oxygen  Code Status:     Code Status Orders  (From admission, onward)         Start     Ordered   02/21/19 0930  Do not attempt resuscitation (DNR)  Continuous    Question Answer Comment  In the event of cardiac or respiratory ARREST Do not call a "code blue"   In the event of cardiac or respiratory ARREST Do not perform Intubation, CPR, defibrillation or ACLS   In the event of cardiac or respiratory ARREST Use medication by any route, position, wound care, and other measures to relive pain and suffering. May use oxygen, suction and manual treatment of airway obstruction as needed for comfort.      02/21/19 0932        Code Status History    Date Active Date Inactive Code Status Order ID Comments User Context    02/09/2019 1755 02/21/2019 0932 Full Code [id number]  Thornell Mule, MD ED   08/24/2018 0022 09/15/2018 1720 Full Code [id number]  Shela Leff, MD Inpatient   09/04/2017 1510 09/06/2017 1920 Full Code [id number]  Epifanio Lesches, MD ED   02/15/2017 1243 02/16/2017 2023 Full Code [id number]  Saundra Shelling, MD Inpatient   10/23/2015 0828 10/23/2015 0908 Full Code [id number]  Saundra Shelling, MD Inpatient   09/03/2015 0130 09/03/2015 1619 Full Code [id number]  Lance Coon, MD Inpatient   07/02/2013 1327 07/14/2013 1540 Full Code [id number]  Nani Skillern, PA-C Inpatient   06/29/2013 2153 07/02/2013 1327 Full Code [id number]  Larey Dresser, MD Inpatient   Advance Care Planning Activity     Family Communication: Spoke with husband on the phone Disposition Plan: Transitional care team to look into LTAC facility  Consultants:  Nephrology  Infectious disease  Vascular surgery  Critical care specialist  Antibiotics:  Vancomycin  Time spent: 25 minutes  [place]

## 2019-03-07 NOTE — Consult Note (Signed)
ANTICOAGULATION CONSULT NOTE  Pharmacy Consult for heparin Indication: DVT  Patient Measurements: Height: 5' 4.02" (162.6 cm) Weight: 182 lb 15.7 oz (83 kg) IBW/kg (Calculated) : 54.74 Heparin Dosing Weight: 71 kg  Vital Signs: Temp: 98.3 F (36.8 C) (01/02 0500) Temp Source: Axillary (01/02 0500) BP: 105/57 (01/02 0500) Pulse Rate: 93 (01/02 0500)  Labs: Recent Labs    03/05/19 0356 03/05/19 2055 03/06/19 0345 03/06/19 1526 03/07/19 0423  HGB 8.6*  --  6.8* 8.8* 7.5*  HCT 27.2*  --  21.7* 25.4* 22.1*  PLT 225  --  171  --  173  HEPARINUNFRC  --  0.32 0.34  --  0.48  CREATININE 2.16*  --  2.63*  --  1.88*  TROPONINIHS  --   --  14  --   --    Estimated Creatinine Clearance: 31.1 mL/min (A) (by C-G formula based on SCr of 1.88 mg/dL (H)).  Medical History: Past Medical History:  Diagnosis Date  . Anemia   . Chronic diastolic CHF (congestive heart failure) ([place])    a. Due to ischemic cardiomyopathy. EF as low as 35%, improved to normal s/p CABG; b. echo 07/06/13: EF 55-60%, no RWMAs, mod TR, trivial pericardial effusion not c/w tamponade physiology;  c. 10/2015 Echo: EF 65%, Gr1 DD, triv AI, mild MR, mildly dil LA, mod TR, PASP 56mmHg.  Marland Kitchen Coronary artery disease    a. NSTEMI 06/2013; b.cath: severe three-vessel CAD w/ EF 30% & mild-mod MR; c. s/p 3 vessel CABG 07/02/13 (LIMA-LAD, SVG-OM, and SVG-RPDA);  d. 10/2015 MV: no ischemia/infarct.  . Diabetes mellitus without complication ([place])   . Diabetic neuropathy ([place])   . Dialysis patient [facility])    MWF  . ESRD (end stage renal disease) ([place])    a. 12/2015 initiated - mwf dialysis.  Marland Kitchen GERD (gastroesophageal reflux disease)   . Hyperlipidemia   . Hypertension   . Hypothyroidism   . Myocardial infarction ([place]) 2015  . Neuropathy   . Pleural effusion 2015  . Pulmonary hypertension ([place])   . Renal insufficiency   . Wears dentures    full lower    Medications:  Scheduled:  . aspirin  81 mg Oral Daily  . B-complex with  vitamin C  1 tablet Per Tube Daily  . Chlorhexidine Gluconate Cloth  6 each Topical Q0600  . epoetin (EPOGEN/PROCRIT) injection  4,000 Units Intravenous Q M,W,F-HD  . feeding supplement (PRO-STAT SUGAR FREE 64)  30 mL Per Tube Daily  . fluticasone  2 spray Each Nare Daily  . free water  20 mL Per Tube Q4H  . insulin aspart  0-15 Units Subcutaneous Q4H  . levothyroxine  88 mcg Oral QAC breakfast  . mouth rinse  15 mL Mouth Rinse BID  . midodrine  10 mg Oral TID WC  . pantoprazole (PROTONIX) IV  40 mg Intravenous Q12H  . venlafaxine  75 mg Oral BID WC  . zinc sulfate  220 mg Oral Daily    Assessment: 64 y.o. female admitted on 02/09/2019 with MRSA  bacteremia. She was on apixaban PTA but last dose was prior to admission (12/7). [place] 12/15 revealed age-indeterminate short segment occlusive DVT involving the mid humeral aspect of one of the paired brachial veins.  Heparin was started but after repeated attempts by multiple phlebotomists lab was unable to draw labs required for heparin monitoring. Additionally, she was too confused and disoriented to swallow oral medications. On 12/20 a femoral central line was placed.  Heparin Course: 12/24 0231 HL 0.26: held due to unwitnessed fall and bleeding from arm 12/25 0413 HL 0.28: subtherapeutic - CBC stable.  Will increase Heparin to 900 units/hr and recheck in 8 hours. 12/25 1356 HL 0.47: therapeutic  12/25 2020 HL 0.36: therapeutic x 2 12/26 HL @0626 = 0.16. Will give Heparin bolus of 2100 units and increase heparin drip to 1100 units/hr until INR is ?2 for at least 2 measurements taken ~24 hours apart 12/30  12/26-12/28 HL therapeutic at 1100 units/hour 12/29 Stopped for PEG 12/30 AVG Excision.  Does not apear heparin was restarted.  Goal of Therapy:  Heparin Level: 0.3 - 0.7 units/mL Monitor platelets by anticoagulation protocol: Yes   Plan:  01/02 @ 0500 HL 0.48 therapeutic. Will continue current rate and will recheck HL w/ am labs, hgb  continues to trend low, will continue to monitor.  Tobie Lords, PharmD, BCPS Clinical Pharmacist 03/07/2019 7:27 AM

## 2019-03-07 NOTE — Consult Note (Signed)
Pharmacy Antibiotic Note  Jamie Arias is a 64 y.o. female admitted on 02/09/2019 with methicillin resistant Staphylococcus aureus.  Pharmacy has been consulted for vancomycin dosing (previous antibiotics included daptomycin and ceftaroline) She recently underwent PEG placement and AVG removal.  Patient received vancomycin loading dose 1500 mg x1 with dialysis on 12/24.  (per ID: Post covid morbidity- Long COVID)    Plan:  Maintenance dose:  Vancomycin 750mg  IV qHD MWF  Patient received dose 12/28 with HD, but missed dose on 12/30 due to surgery.  However, patient received shorter 2 hour HD session on 12/30.  Based on pharmacokinetic estimation, patient is likely subtherapeutic (calculated level ~18).  Therefore, give vancomycin 500 mg x1 dose tonight.  Plan to draw random vancomycin level tomorrow before HD (12/31).   Goal pre-HD vanco level is 20 to 25 per Dr. Delaine [name].  1/1:  Vancomycin level pre-HD= 24 mcg/ml.  Continue current therapy. Pt received dose with HD. F/u next Vanc level pre-HD with 3rd HD from this level (Wed 1/6 ?)    Height: 5' 4.02" (162.6 cm) Weight: 182 lb 15.7 oz (83 kg) IBW/kg (Calculated) : 54.74  Temp (24hrs), Avg:98.9 F (37.2 C), Min:98.2 F (36.8 C), Max:100.1 F (37.8 C)  Recent Labs  Lab 03/02/19 0330 03/03/19 2100 03/03/19 2139 03/04/19 0417 03/05/19 0356 03/06/19 0345 03/07/19 0423  WBC  --   --  10.0 6.7 9.7 7.2 8.9  CREATININE  --  2.95*  --  3.14* 2.16* 2.63* 1.88*  VANCORANDOM 19  --   --   --   --  24  --     Estimated Creatinine Clearance: 31.1 mL/min (A) (by C-G formula based on SCr of 1.88 mg/dL (H)).    Antimicrobials this admission: ceftaroline 12/14 >> 12/21 daptomycin 12/14 >> 12/26 Vancomycin 12/24 >>  Microbiology results: 12/30 Aerobic/Anaerobic Cx:  NG 12/16 BCx: NG 12/15 WCx MRSA 12/13 BCx: MRSA   Thank you for allowing pharmacy to be a part of this patient's care.  Rayna Sexton, PharmD, BCPS Clinical  Pharmacist 03/07/2019 7:56 AM

## 2019-03-07 NOTE — Progress Notes (Signed)
Jamie Arias  MRN: [id number]  DOB/AGE: [birth year] 64 y.o.  Primary Care Physician:Sonnenberg, Angela Adam, MD  Admit date: 02/09/2019  Chief Complaint:  Chief Complaint  Patient presents with  . Altered Mental Status    S-Pt presented on  02/09/2019 with  Chief Complaint  Patient presents with  . Altered Mental Status  . Patient more alert than earlier but  unable to offer any complaints.  As always patient husband was present in the room.  Patient husband voiced no new concerns  Medications . aspirin  81 mg Oral Daily  . B-complex with vitamin C  1 tablet Per Tube Daily  . Chlorhexidine Gluconate Cloth  6 each Topical Q0600  . epoetin (EPOGEN/PROCRIT) injection  4,000 Units Intravenous Q M,W,F-HD  . feeding supplement (PRO-STAT SUGAR FREE 64)  30 mL Per Tube Daily  . fluticasone  2 spray Each Nare Daily  . free water  20 mL Per Tube Q4H  . insulin aspart  0-15 Units Subcutaneous Q4H  . levothyroxine  88 mcg Oral QAC breakfast  . mouth rinse  15 mL Mouth Rinse BID  . midodrine  10 mg Oral TID WC  . pantoprazole (PROTONIX) IV  40 mg Intravenous Q12H  . venlafaxine  75 mg Oral BID WC  . zinc sulfate  220 mg Oral Daily         ROS: Unable to get any data    physical Exam: Vital signs in last 24 hours: Temp:  [98.3 F (36.8 C)-100.1 F (37.8 C)] 99 F (37.2 C) (01/02 0755) Pulse Rate:  [28-108] 98 (01/02 0800) Resp:  [6-28] 20 (01/02 0800) BP: (86-126)/(42-89) 110/58 (01/02 0800) SpO2:  [89 %-100 %] 98 % (01/02 0800) Weight:  [83 kg] 83 kg (01/01 1500) Weight change:  Last BM Date: 03/07/19(RECTAL TUBE IN PLACE)  Intake/Output from previous day: 01/01 0701 - 01/02 0700 In: 1092.8 [I.V.:291.1; NG/GT:601.7; IV Piggyback:200] Out: 650 [Stool:150] No intake/output data recorded.   Physical Exam: General-in no acute distress, patient is chronically ill appearing  Resp- No acute REsp distress, decreased breath sound at bases  CVS- S1S2 regular in rate and  rhythm GIT- BS+, soft, NT, ND EXT- NO LE Edema, No Cyanosis Access left IJ Right upper arm wound VAC in situ  Lab Results: CBC Recent Labs    03/06/19 0345 03/06/19 1526 03/07/19 0423  WBC 7.2  --  8.9  HGB 6.8* 8.8* 7.5*  HCT 21.7* 25.4* 22.1*  PLT 171  --  173    BMET Recent Labs    03/06/19 0345 03/07/19 0423  NA 134* 136  K 3.9 3.7  CL 100 99  CO2 25 26  GLUCOSE 206* 191*  BUN 23 19  CREATININE 2.63* 1.88*  CALCIUM 8.0* 8.2*    MICRO Recent Results (from the past 240 hour(s))  Aerobic/Anaerobic Culture (surgical/deep wound)     Status: None (Preliminary result)   Collection Time: 03/04/19 11:04 AM   Specimen: PATH Other; Tissue  Result Value Ref Range Status   Specimen Description   Final    ARTERY Performed at [facility], [address] [place], [place] [address]    Special Requests   Final    RIGHT AV GRAFT AND STENTS CULTURE Performed at [facility], [address] [place], [place] [address]    Gram Stain   Final    RARE WBC PRESENT, PREDOMINANTLY PMN NO ORGANISMS SEEN    Culture   Final    NO  GROWTH 2 DAYS NO ANAEROBES ISOLATED; CULTURE IN PROGRESS FOR 5 DAYS Performed at [facility], [place] [address] [place], [place] [address]    Report Status PENDING  Incomplete  Aerobic/Anaerobic Culture (surgical/deep wound)     Status: None (Preliminary result)   Collection Time: 03/04/19 11:29 AM   Specimen: PATH Other; Tissue  Result Value Ref Range Status   Specimen Description   Final    ARTERY Performed at [facility], [address], [place], [place] [address]    Special Requests   Final    FLUID AROUND AV GRAFT Performed at [facility], [address] [place], [place] [address]    Gram Stain   Final    FEW WBC PRESENT, PREDOMINANTLY PMN NO ORGANISMS SEEN    Culture   Final    NO GROWTH 2 DAYS NO ANAEROBES ISOLATED; CULTURE IN PROGRESS FOR 5 DAYS Performed at [facility] [address], [place], [place] [address]    Report Status PENDING  Incomplete      Lab Results  Component Value Date   PTH 135 (H) 02/22/2017   CALCIUM 8.2 (L) 03/07/2019   CAION 1.20 07/03/2013   PHOS 3.5 03/06/2019               Impression:  64 y.o.caucasian female with end-stage renal disease, atrial fibrillation, chronic diastolic congestive heart failure, CAD status post PCI, CABG,  type 2 diabetes, hypertension, hyperlipidemia, hypothyroidism, Covid positive in July 2020, recent nursing home stay in [place],Prolonged hospitalization for MRSA bacteremia/sepsis and now with acute encephalopathy  1)Renal ESRD on hemodialysis Patient is on Monday Wednesday Friday schedule as an outpatient  NEPHROLOGIST: Lavonia Dana MD  LOCATION: [facility]: M-W-F 2nd Shift  EDW: 81.5 kg.  KIDNEY: [place] Optiflux 180NR  LITERS PROC: liters/treatment  HD TIME: 225  ACCESS: R Upper Arm AVF  NEEDLE SIZE:  ANTICOAG: Custom Heparin 2000  BATH: 2K/2.5Ca  QB: 400 ml/min  QD: 600 ml/min     As an inpatient-Patient was dialyzed yesterday/Friday No need for hemodialysis today   2) hypotension Blood pressure is stable Patient is on midodrine  3)Anemia of chronic disease  HGb is not at goal (9--11) Patient did receive PRBC Patient is on Epogen during dialysis  4) secondary hyperparathyroidism -CKD Mineral-Bone Disorder   Secondary Hyperparathyroidism present Patient phosphorus is now at goal  5) altered mental status Patient has acute metabolic encephalopathy Now better   6) hypokalemia Potassium is being replete   7)Acid base Co2 at goal  8) septic shock secondary to MRSA bacteremia Patient was admitted with septic shock Blood cultures were positive for MRSA on  12/7,12/8,12/9,12/11, 12/13 & 12/15 12/16 blood culture results showed no growth  AV graft sent for culture results are still pending patient is now clinically better Source of  the bacteremia possibly AV graft now removed  atient has had transthoracic echo as well as CT spine Critical care team,Hospitalist and ID are following closely  Plan:   No acute indication for renal replacement therapy/dialysis today We will dialyze patient Monday      Fabian Walder s [place] 03/07/2019, 9:55 AM

## 2019-03-07 NOTE — Progress Notes (Signed)
PULMONARY / CRITICAL CARE MEDICINE   Name: Jamie Arias MRN: [id number] DOB: 1956/01/31    ADMISSION DATE:  02/09/2019 CONSULTATION DATE:  03/03/2019  REFERRING MD:  Dr. Leslye Peer  CHIEF COMPLAINT:  Remains intubated post PEG placement 03/03/19  BRIEF DISCUSSION: 64 y.o. Female with PMH notable for ESRD on HD, COVID-19 infection in 09/2018, HFpEF, who was admitted 27/0/62 due to Metabolic Encephalopathy & Septic Shock secondary to MRSA Bacteremia.  Infectious disease is following.  Suspected source of Bacteremia is AV graft with stent.  On 12/29 she underwent PEG placement due to persistent encephalopathy and poor PO intake.  She returns to ICU post PEG and remains intubated, as she is to undergo AV Graft removal on 12/30.  Pt also has age indeterminate left arm DVT. PAST MEDICAL HISTORY :  She  has a past medical history of Anemia, Chronic diastolic CHF (congestive heart failure) ([place]), Coronary artery disease, Diabetes mellitus without complication ([place]), Diabetic neuropathy ([place]), Dialysis patient ([place]), ESRD (end stage renal disease) ([place]), GERD (gastroesophageal reflux disease), Hyperlipidemia, Hypertension, Hypothyroidism, Myocardial infarction ([place]) (2015), Neuropathy, Pleural effusion (2015), Pulmonary hypertension ([place]), Renal insufficiency, and Wears dentures.  PAST SURGICAL HISTORY: She  has a past surgical history that includes Cataract extraction (Bilateral); Coronary artery bypass graft (N/A, 07/02/2013); Intraoprative transesophageal echocardiogram (N/A, 07/02/2013); Thoracentesis (Left, 2015); Cholecystectomy (N/A, 12/09/2014); Esophagogastroduodenoscopy (egd) with propofol (N/A, 11/24/2015); Cardiac catheterization (Right, 12/06/2015); Eye surgery (Bilateral); Cardiac catheterization; AV fistula placement (Right, 02/03/2016); PORTA CATH REMOVAL (N/A, 06/01/2016); PERIPHERAL VASCULAR THROMBECTOMY (Right, 09/28/2016); A/V Fistulagram (Right, 12/17/2016); A/V Fistulagram (Right, 01/07/2017); A/V  SHUNT INTERVENTION (N/A, 02/22/2017); A/V Fistulagram (Right, 12/03/2017); PERIPHERAL VASCULAR THROMBECTOMY (Right, 05/20/2018); A/V Fistulagram (Right, 02/23/2019); PEG placement (N/A, 03/03/2019); and Arteriovenous goretex graft removal (Right, 03/04/2019).  Allergies  Allergen Reactions  . Nsaids Other (See Comments)    Contraindicated due to kidney disease.  Marland Kitchen Doxycycline Other (See Comments)    tremor    No current facility-administered medications on file prior to encounter.   Current Outpatient Medications on File Prior to Encounter  Medication Sig  . ACCU-CHEK FASTCLIX LANCETS MISC Check blood glucose twice daily, diagnosis code E11.9  . acetaminophen (TYLENOL) 325 MG tablet Take 650 mg by mouth daily as needed for moderate pain or headache.   . Alirocumab (PRALUENT) 75 MG/ML SOAJ Inject 75 mg into the skin every 14 (fourteen) days.  . Amino Acids-Protein Hydrolys (FEEDING SUPPLEMENT, PRO-STAT SUGAR FREE 64,) LIQD Take 30 mLs by mouth 3 (three) times daily between meals.  Marland Kitchen amiodarone (PACERONE) 200 MG tablet Take 200 mg by mouth daily.  Marland Kitchen aspirin EC 81 MG tablet Take 81 mg by mouth daily.  Marland Kitchen atorvastatin (LIPITOR) 40 MG tablet Take 40 mg by mouth daily.  . Blood Glucose Monitoring Suppl (ACCU-CHEK AVIVA PLUS) w/Device KIT Use as directed to test blood sugar up to three times daily  . Calcium Acetate 667 MG TABS Take 2,001 mg by mouth 3 (three) times daily with meals.  Marland Kitchen ELIQUIS 5 MG TABS tablet Take 5 mg by mouth 2 (two) times daily.   . ferrous sulfate 325 (65 FE) MG tablet Take 325 mg by mouth daily with breakfast.  . FLUoxetine (PROZAC) 20 MG capsule Take 20 mg by mouth daily.  . fluticasone (FLONASE) 50 MCG/ACT nasal spray Place 2 sprays into both nostrils daily. (Patient taking differently: Place 2 sprays into both nostrils daily as needed for allergies or rhinitis. )  . furosemide (LASIX) 20 MG tablet Take 40 mg by mouth every  other day. Non-dialysis days   . gabapentin  (NEURONTIN) 300 MG capsule Take 300 mg by mouth 2 (two) times daily.   Marland Kitchen glucose blood (ACCU-CHEK AVIVA) test strip Use as instructed to test blood sugar up to 3 times daily  . Insulin Glargine (BASAGLAR KWIKPEN) 100 UNIT/ML SOPN Inject 18 Units into the skin daily.  . Lancets (ACCU-CHEK SOFT TOUCH) lancets Use as instructed  . levothyroxine (SYNTHROID) 88 MCG tablet Take 88 mcg by mouth daily before breakfast.  . lidocaine-prilocaine (EMLA) cream Apply 1 application topically as needed (port access).  . midodrine (PROAMATINE) 10 MG tablet Take 1 tablet (10 mg total) by mouth 3 (three) times daily with meals. (Patient taking differently: Take 10 mg by mouth 3 (three) times daily as needed (low BP). )  . multivitamin (RENA-VIT) TABS tablet Take 1 tablet by mouth at bedtime.  . nitroGLYCERIN (NITROSTAT) 0.4 MG SL tablet DISSOLVE ONE TABLET UNDER THE TONGUE EVERY 5 MINUTES AS NEEDED FOR CHEST PAIN.  DO NOT EXCEED A TOTAL OF 3 DOSES IN 15 MINUTES (Patient taking differently: Place 0.4 mg under the tongue every 5 (five) minutes as needed for chest pain. DISSOLVE ONE TABLET UNDER THE TONGUE EVERY 5 MINUTES AS NEEDED FOR CHEST PAIN.  DO NOT EXCEED A TOTAL OF 3 DOSES IN 15 MINUTES)  . ondansetron (ZOFRAN-ODT) 4 MG disintegrating tablet Take 1 tablet (4 mg total) by mouth every 8 (eight) hours as needed for nausea or vomiting. appt with PCP further refills Dr. Caryl Bis  . pantoprazole (PROTONIX) 40 MG tablet Take 40 mg by mouth daily.  . vitamin C (VITAMIN C) 500 MG tablet Take 1 tablet (500 mg total) by mouth daily.  Marland Kitchen zinc sulfate 220 (50 Zn) MG capsule Take 1 capsule (220 mg total) by mouth daily.  . mirtazapine (REMERON) 15 MG tablet Take 1 tablet (15 mg total) by mouth at bedtime. (Patient not taking: Reported on 02/09/2019)    FAMILY HISTORY:  Her She indicated that her mother is deceased. She indicated that her father is deceased. She indicated that her sister is alive. She indicated that the status  of her maternal grandmother is unknown. She indicated that the status of her paternal grandfather is unknown. She indicated that both of her daughters are alive. She indicated that both of her sons are alive. She indicated that the status of her neg hx is unknown.   SOCIAL HISTORY: She  reports that she has never smoked. She has never used smokeless tobacco. She reports that she does not drink alcohol or use drugs.    Physical Exam Constitutional:      Appearance: She is obese. She is ill-appearing.  HENT:     Head: Normocephalic.     Nose: Nose normal.     Mouth/Throat:     Mouth: Mucous membranes are moist.  Eyes:     Extraocular Movements: Extraocular movements intact.  Cardiovascular:     Rate and Rhythm: Regular rhythm.  Pulmonary:     Effort: Pulmonary effort is normal.  Abdominal:     Palpations: Abdomen is soft.  Musculoskeletal:     Cervical back: Normal range of motion and neck supple.  Skin:    Coloration: Skin is jaundiced.  Neurological:     Mental Status: She is disoriented.      REVIEW OF SYSTEMS:   Unable to assess due to critical illness and intubation  SUBJECTIVE:  Unable to assess due to critical illness and intubation  VITAL SIGNS: BP Marland Kitchen)  110/58   Pulse 98   Temp 99 F (37.2 C) (Axillary)   Resp 20   Ht 5' 4.02" (1.626 m)   Wt 83 kg   SpO2 98%   BMI 31.39 kg/m   HEMODYNAMICS:    VENTILATOR SETTINGS:    INTAKE / OUTPUT: I/O last 3 completed shifts: In: 1975.5 [I.V.:533.8; Other:90; NG/GT:1151.7; IV Piggyback:200] Out: 150 [Other:500; Stool:275]  PHYSICAL EXAMINATION: General:  Acute on chronically ill appearing female, laying in bed, intubated, in no acute distress Neuro:  Opens eyes and withdraws from pain, pupils PERRLA 4 mm bilaterally HEENT:  Atraumatic, normocephalic, neck supple, no JVD, ETT in place Cardiovascular:  Regular rate & rhythm, s1s2 noted, no M/R/G Lungs:  Clear diminished breath sounds to auscultation  bilaterally, no wheezing or rhonchi noted, even, vent assisted Abdomen:  Obese, soft, nontender, nondistended, no guarding or rebound tenderness, BS hypoactive Musculoskeletal:  No deformities Skin:  Left heel stage II decubitus ulcer with black eschar, right heel stage II decubitus ulcer  LABS:  BMET Recent Labs  Lab 03/05/19 0356 03/06/19 0345 03/07/19 0423  NA 137 134* 136  K 3.6 3.9 3.7  CL 99 100 99  CO2 '26 25 26  '$ BUN '14 23 19  '$ CREATININE 2.16* 2.63* 1.88*  GLUCOSE 102* 206* 191*    Electrolytes Recent Labs  Lab 03/04/19 0417 03/05/19 0356 03/06/19 0345 03/07/19 0423  CALCIUM 8.5* 8.5* 8.0* 8.2*  MG 2.1 1.7 2.1  --   PHOS 3.7 2.8 3.5  --     CBC Recent Labs  Lab 03/05/19 0356 03/06/19 0345 03/06/19 1526 03/07/19 0423  WBC 9.7 7.2  --  8.9  HGB 8.6* 6.8* 8.8* 7.5*  HCT 27.2* 21.7* 25.4* 22.1*  PLT 225 171  --  173    Coag's No results for input(s): APTT, INR in the last 168 hours.  Sepsis Markers No results for input(s): LATICACIDVEN, PROCALCITON, O2SATVEN in the last 168 hours.  ABG Recent Labs  Lab 03/03/19 1843 03/04/19 0520  PHART 7.56* 7.51*  PCO2ART 34 36  PO2ART 190* 135*    Liver Enzymes Recent Labs  Lab 03/01/19 0429 03/05/19 0356  AST 22 25  ALT 17 16  ALKPHOS 58 88  BILITOT 0.9 0.8  ALBUMIN 1.8* 1.9*    Cardiac Enzymes No results for input(s): TROPONINI, PROBNP in the last 168 hours.  Glucose Recent Labs  Lab 03/06/19 1038 03/06/19 1527 03/06/19 1951 03/06/19 2330 03/07/19 0404 03/07/19 0741  GLUCAP 146* 141* 169* 156* 171* 163*    Imaging MR BRAIN WO CONTRAST  Result Date: 03/06/2019 CLINICAL DATA:  Metabolic encephalopathy. Renal failure. Coronavirus infection. Bacteremia. EXAM: MRI HEAD WITHOUT CONTRAST TECHNIQUE: Multiplanar, multiecho pulse sequences of the brain and surrounding structures were obtained without intravenous contrast. COMPARISON:  Head CT 02/26/2019.  MRI 02/12/2019 FINDINGS: Brain:  Diffusion imaging does not show any acute or subacute infarction. Chronic small-vessel ischemic changes affect the pons. No focal cerebellar insult. Cerebral hemispheres show chronic small-vessel ischemic changes of the deep white matter. No cortical or large vessel territory infarction. No mass lesion, hemorrhage, hydrocephalus or extra-axial collection. Incomplete septum pellucidum, a congenital finding. Vascular: Major vessels at the base of the brain show flow. Skull and upper cervical spine: Negative Sinuses/Orbits: Clear/normal Other: Small bilateral mastoid effusions. IMPRESSION: No acute finding. Chronic small-vessel ischemic changes of the pons and cerebral hemispheric white matter. Electronically Signed   By: Nelson Chimes M.D.   On: 03/06/2019 14:50     STUDIES:  12/7 -  CT Head wo Contrast>> 1. No acute intracranial abnormality. 2. Mild parenchymal atrophy and chronic microvascular angiopathy. 12/7 - 2D Echocardiogram>> EF 60-65%, Grade II Diastolic Dysfunction 37/1 - Venous US LUE>>Examination is positive for age-indeterminate short segment occlusive DVT involving the mid humeral aspect of one of the paired brachial veins. There is no extension of this short-segment occlusive DVT to the more proximal venous system of the left upper extremity. 12/10 - MR Brain w/o Contrast >>1. No acute intracranial abnormality. 2. Mild chronic small vessel disease. 12/15 - CT Cervical Spine>> No evidence of discitis/osteomyelitis or collection. 12/15 - CT Thoracic Spine>>No evidence of discitis/osteomyelitis in the thoracic spine. No collection. Bilateral pleural effusions and bibasilar atelectasis/consolidation. 12/15 - CT Lumbar Spine>> No evidence of discitis/osteomyelitis in the lumbar spine. Nonspecific mild presacral edema. No paraspinal collection 12/16 - DG 2V Left Foot>> Calcaneal spur.  No evidence of osteomyelitis. 12/24 - CT Head wo Contrast>>1. Generalized atrophy and chronic ischemic  microangiopathy without acute intracranial abnormality. 12/24 - CT Cervical Spine>> No acute fracture or static subluxation of the cervical spine.  CULTURES: SARS-CoV-2 PCR 12/7>> negative Blood culture 12/7>> MRSA MRSA PCR 12/8>> positive Blood culture 12/9>> MRSA Blood cultures 12/11>> MRSA Blood cultures 12/13>> MRSA Wound culture from left heel 12/15>> MRSA Blood cultures 12/16>> no growth to date  ANTIBIOTICS: Ceftaroline 12/14>> 12/22 Daptomycin 12/14>>12/28 Vancomycin 12/24>>  SIGNIFICANT EVENTS: 12/7>> admission to MedSurg 12/8>> transfer to stepdown due to hypotension requiring vasopressors; PCCM and infectious disease consulted 06/26>> fall, no complications noted 94/85>> PEG tube placement, returns to ICU, remains intubated  LINES/TUBES: Right Femoral CVC 12/8>>12/14 Left IJ Tunneled HD Catheter Left Femoral CVC 12/19>> PEG tube placement 12/29>> ETT 12/29>>12/31    ASSESSMENT / PLAN:  Post operative respiratory failure-resolved  -patient was unable to be weaned off mechanical ventilation after PEG tube -patient is now s/p vascular procedure -patient has been on spontaneous breathing trial today for 2 hours - doing ok but mentation is lethargic GCS8T -liberated from MV 12/31 - doing well on Room air now   Post anesthesia/post operative transient hypotension with intercurrent septic shock-resolved   - s/p vasopressor support  -continue current IV antimicrobials   Septic Shock-resolved   - due to MRSA bacteremia  - ID on case - appreicate input  - IV vancomycinn - 6 weeks until 04/01/19 to be administered during HD -remains on Levophed - pt with L subclavian trialysis and Left fem venous TLC - high risk for CLABSI will need to monitor and remove asap - post RUE AVF repair with vascular surgery - wrapped with vac dressing - serosang active draining low volume -Cortisol level today-not starting empiric steroids due to bleeding/MRSA infection/DM/wound  healing   Altered mental status with lethargy-improved   - likely multifactorial   - husband states patient at baseline is with little communication - possible vascular dementia, she is also with chronic ESRD and therefore at increased risk for vascular dementia and recurrent metabolic encephalopathy. She is currently with septic shock and component of septic encephalopathy.  Remote COVID infection with likely further insult to mentation and possible additional embolic cerebral phenomenon. 02/12/19-MRI brain with multifocal white matter hyperintestity due to ischemic microangiopathy, will repeat to eval for interval changes.     Acute blood loss anemia- resolved  - s/p AFV repair   - background of AOCD with ESRD  - s/p 1uPRBC  -monitor vitals, repeat h/h in 6h   Chronic HFpEF Paroxsymal Atrial Fibrillation ~ currently in NSR Hx: HTN, CAD  s/p stent, Chronic HFpEF Continuous cardiac monitoring Maintain MAP >60 (given she is on Hemodialysis) Levophed if needed to maintain MAP goal Continue Midodrine Cautious IV Fluids given ESRD on HD Volume removal with HD Heparin drip for Anticoagulation 2D Echocardiogram 02/10/19>> EF 60-65%, grade II diastolic dysfuntion  RENAL A:   ESRD on HD P:   Monitor I&O's / urinary output Follow BMP Ensure adequate renal perfusion Avoid nephrotoxic agents as able Replace electrolytes as indicated Nephrology following, appreciate input ~ HD as per Nephrology  GASTROINTESTINAL A:   No acute issues  Hx: GERD P:   NPO for now; Able to start tube feeds on 12/30 following AV graft removal Protonix for SUP PEG placed on 03/03/19 by GI, will follow recommendations  HEMATOLOGIC A:   Left Arm DVT P:  Monitor for S/Sx of bleeding Trend CBC Heparin drip for VTE Prophylaxis/Anticoagulation  Transfuse for Hgb <7    ENDOCRINE A:   DM II  Hypothyoidism P:   CBG's SSI Follow ICU Hypo/hyperglycemia protocol Continue Synthroid    FAMILY  -  Updates: No family at bedside during NP rounds 03/03/19.  Pt prognosis is poor.  Pt is DNR.  - Inter-disciplinary family meet or Palliative Care currently following.   Critical care provider statement:    Critical care time (minutes):  33   Critical care time was exclusive of:  Separately billable procedures and  treating other patients   Critical care was necessary to treat or prevent imminent or  life-threatening deterioration of the following conditions:  altered mental status with lethrargy, post operative respiratory failure, multiple comorbid conditions   Critical care was time spent personally by me on the following  activities:  Development of treatment plan with patient or surrogate,  discussions with consultants, evaluation of patient's response to  treatment, examination of patient, obtaining history from patient or  surrogate, ordering and performing treatments and interventions, ordering  and review of laboratory studies and re-evaluation of patient's condition   I assumed direction of critical care for this patient from another  provider in my specialty: no      Ottie Glazier, M.D.  [facility] [place]

## 2019-03-07 NOTE — Progress Notes (Signed)
POD#3, s/p right upper arm AVGG removal Incisional vac removed today Wound is intact without erythema Plan for dry dressing to be changed daily and PRN Dr. Lucky Cowboy to re-evaluate wound on [place]

## 2019-03-08 LAB — GLUCOSE, CAPILLARY
Glucose-Capillary: 121 mg/dL — ABNORMAL HIGH (ref 70–99)
Glucose-Capillary: 132 mg/dL — ABNORMAL HIGH (ref 70–99)
Glucose-Capillary: 134 mg/dL — ABNORMAL HIGH (ref 70–99)
Glucose-Capillary: 154 mg/dL — ABNORMAL HIGH (ref 70–99)
Glucose-Capillary: 170 mg/dL — ABNORMAL HIGH (ref 70–99)
Glucose-Capillary: 82 mg/dL (ref 70–99)

## 2019-03-08 LAB — CBC
HCT: 21 % — ABNORMAL LOW (ref 36.0–46.0)
Hemoglobin: 7.1 g/dL — ABNORMAL LOW (ref 12.0–15.0)
MCH: 30 pg (ref 26.0–34.0)
MCHC: 33.8 g/dL (ref 30.0–36.0)
MCV: 88.6 fL (ref 80.0–100.0)
Platelets: 183 10*3/uL (ref 150–400)
RBC: 2.37 MIL/uL — ABNORMAL LOW (ref 3.87–5.11)
RDW: 20.7 % — ABNORMAL HIGH (ref 11.5–15.5)
WBC: 8.9 10*3/uL (ref 4.0–10.5)
nRBC: 0.7 % — ABNORMAL HIGH (ref 0.0–0.2)

## 2019-03-08 LAB — BASIC METABOLIC PANEL
Anion gap: 9 (ref 5–15)
BUN: 30 mg/dL — ABNORMAL HIGH (ref 8–23)
CO2: 26 mmol/L (ref 22–32)
Calcium: 8.1 mg/dL — ABNORMAL LOW (ref 8.9–10.3)
Chloride: 100 mmol/L (ref 98–111)
Creatinine, Ser: 2.7 mg/dL — ABNORMAL HIGH (ref 0.44–1.00)
GFR calc Af Amer: 21 mL/min — ABNORMAL LOW (ref >60)
GFR calc non Af Amer: 18 mL/min — ABNORMAL LOW (ref >60)
Glucose, Bld: 122 mg/dL — ABNORMAL HIGH (ref 70–99)
Potassium: 3.7 mmol/L (ref 3.5–5.1)
Sodium: 135 mmol/L (ref 135–145)

## 2019-03-08 LAB — MAGNESIUM: Magnesium: 2.2 mg/dL (ref 1.7–2.4)

## 2019-03-08 LAB — HEPARIN LEVEL (UNFRACTIONATED): Heparin Unfractionated: 0.36 IU/mL (ref 0.30–0.70)

## 2019-03-08 NOTE — Progress Notes (Signed)
Pharmacy Electrolyte Monitoring Consult:  Pharmacy consulted to assist in monitoring and replacing electrolytes in this 64 y.o. female admitted on 02/09/2019. Patient with ESRD. Patient with MRSA bacteremia. She has a h/o CAD and is s/p CABG. Hemodialysis MWF.   Labs:  Sodium (mmol/L)  Date Value  03/08/2019 135  03/30/2014 140   Potassium (mmol/L)  Date Value  03/08/2019 3.7  03/30/2014 4.3   Magnesium (mg/dL)  Date Value  03/08/2019 2.2  07/19/2013 1.7 (L)   Phosphorus (mg/dL)  Date Value  03/06/2019 3.5   Calcium (mg/dL)  Date Value  03/08/2019 8.1 (L)   Calcium, Total (mg/dL)  Date Value  03/30/2014 9.0   Albumin (g/dL)  Date Value  03/05/2019 1.9 (L)  02/11/2018 4.2  07/23/2013 2.7 (L)   Corrected Ca: 9.9 mg/dL   Plan: No replacement at this time. Pharmacy will sign off as electrolytes seems to be stable.    Eleonore Chiquito , PharmD, BCPS Clinical Pharmacist 03/08/2019 8:23 AM

## 2019-03-08 NOTE — Progress Notes (Signed)
Jamie Arias  MRN: [id number]  DOB/AGE: 66/05/1955 66 y.o.  Primary Care Physician:Sonnenberg, Jamie Adam, MD  Admit date: 02/09/2019  Chief Complaint:  Chief Complaint  Patient presents with  . Altered Mental Status    S-Pt presented on  02/09/2019 with  Chief Complaint  Patient presents with  . Altered Mental Status  . Patient is  unable to offer any complaints.   Medications . aspirin  81 mg Oral Daily  . B-complex with vitamin C  1 tablet Per Tube Daily  . Chlorhexidine Gluconate Cloth  6 each Topical Q0600  . epoetin (EPOGEN/PROCRIT) injection  4,000 Units Intravenous Q M,W,F-HD  . feeding supplement (PRO-STAT SUGAR FREE 64)  30 mL Per Tube Daily  . fluticasone  2 spray Each Nare Daily  . free water  20 mL Per Tube Q4H  . insulin aspart  0-15 Units Subcutaneous Q4H  . levothyroxine  88 mcg Oral QAC breakfast  . mouth rinse  15 mL Mouth Rinse BID  . midodrine  10 mg Oral TID WC  . pantoprazole (PROTONIX) IV  40 mg Intravenous Q12H  . venlafaxine  75 mg Oral BID WC  . zinc sulfate  220 mg Oral Daily         ROS: Unable to get any data    physical Exam: Vital signs in last 24 hours: Temp:  [97.5 F (36.4 C)-98.5 F (36.9 C)] 98.5 F (36.9 C) (01/03 0617) Pulse Rate:  [82-102] 84 (01/03 0617) Resp:  [17-28] 17 (01/03 0617) BP: (91-116)/(47-62) 106/52 (01/03 0617) SpO2:  [97 %-100 %] 100 % (01/03 0617) Weight change:  Last BM Date: 03/07/19  Intake/Output from previous day: 01/02 0701 - 01/03 0700 In: -  Out: 700 [Stool:700] No intake/output data recorded.   Physical Exam: General-in no acute distress, patient is chronically ill appearing  Resp- No acute REsp distress, decreased breath sound at bases  CVS- S1S2 regular in rate and rhythm GIT- BS+, soft, NT, ND EXT- NO LE Edema, No Cyanosis Access left IJ Right upper arm wound VAC in situ  Lab Results: CBC Recent Labs    03/07/19 0423 03/08/19 0700  WBC 8.9 8.9  HGB 7.5* 7.1*  HCT 22.1*  21.0*  PLT 173 183    BMET Recent Labs    03/07/19 0423 03/08/19 0700  NA 136 135  K 3.7 3.7  CL 99 100  CO2 26 26  GLUCOSE 191* 122*  BUN 19 30*  CREATININE 1.88* 2.70*  CALCIUM 8.2* 8.1*    MICRO Recent Results (from the past 240 hour(s))  Aerobic/Anaerobic Culture (surgical/deep wound)     Status: None (Preliminary result)   Collection Time: 03/04/19 11:04 AM   Specimen: PATH Other; Tissue  Result Value Ref Range Status   Specimen Description   Final    ARTERY Performed at [facility], [address] [place], [place] [address]    Special Requests   Final    RIGHT AV GRAFT AND STENTS CULTURE Performed at [facility], [address] [place], [place] [address]    Gram Stain   Final    RARE WBC PRESENT, PREDOMINANTLY PMN NO ORGANISMS SEEN    Culture   Final    NO GROWTH 3 DAYS NO ANAEROBES ISOLATED; CULTURE IN PROGRESS FOR 5 DAYS Performed at [facility] [address], [place], [place] [address]    Report Status PENDING  Incomplete  Aerobic/Anaerobic Culture (surgical/deep wound)     Status: None (  Preliminary result)   Collection Time: 03/04/19 11:29 AM   Specimen: PATH Other; Tissue  Result Value Ref Range Status   Specimen Description   Final    ARTERY Performed at [facility], [address], [place], [place] [address]    Special Requests   Final    FLUID AROUND AV GRAFT Performed at [facility], [address] [place], [place] [address]    Gram Stain   Final    FEW WBC PRESENT, PREDOMINANTLY PMN NO ORGANISMS SEEN    Culture   Final    NO GROWTH 3 DAYS NO ANAEROBES ISOLATED; CULTURE IN PROGRESS FOR 5 DAYS Performed at [facility] [address] [place], [place] [address]    Report Status PENDING  Incomplete      Lab Results  Component Value Date   PTH 135 (H) 02/22/2017   CALCIUM 8.1 (L) 03/08/2019   CAION 1.20 07/03/2013   PHOS 3.5 03/06/2019                Impression:  64 y.o.caucasian female with end-stage renal disease, atrial fibrillation, chronic diastolic congestive heart failure, CAD status post PCI, CABG,  type 2 diabetes, hypertension, hyperlipidemia, hypothyroidism, Covid positive in July 2020, recent nursing home stay in [place],Prolonged hospitalization for MRSA bacteremia/sepsis and now with acute encephalopathy  1)Renal ESRD on hemodialysis Patient is on Monday Wednesday Friday schedule as an outpatient  NEPHROLOGIST: Jamie Dana MD  LOCATION: [place]: M-W-F 2nd Shift  EDW: 81.5 kg.  KIDNEY: [place] Optiflux 180NR  LITERS PROC: liters/treatment  HD TIME: 225  ACCESS: R Upper Arm AVF  NEEDLE SIZE:  ANTICOAG: Custom Heparin 2000  BATH: 2K/2.5Ca  QB: 400 ml/min  QD: 600 ml/min     As an inpatient-Patient was dialyzed yesterday/Friday No need for hemodialysis today Will dialyze in am   2) hypotension Blood pressure is stable Patient is on midodrine  3)Anemia of chronic disease  HGb is not at goal (9--11) Patient did receive PRBC during this admission. Pt may need another PRBC  Patient is on Epogen during dialysis  4) secondary hyperparathyroidism -CKD Mineral-Bone Disorder   Secondary Hyperparathyroidism present Patient phosphorus is now at goal  5) altered mental status Patient has acute metabolic encephalopathy Now better   6) hypokalemia Potassium is being replete   7)Acid base Co2 at goal  8) septic shock secondary to MRSA bacteremia Patient was admitted with septic shock Blood cultures were positive for MRSA on  12/7,12/8,12/9,12/11, 12/13 & 12/15 12/16 blood culture results showed no growth  AV graft sent for culture results are still pending- no growth so far  patient is now clinically better Source of the bacteremia possibly AV graft now removed  atient has had transthoracic echo as well as CT spine Critical care team,Hospitalist and ID  are following closely  Plan:  We will dialyze patient Monday  Pt may need PRBC-will d/w primary team  No acute indication for renal replacement therapy/dialysis today   Jamie Arias s Jamie Arias 03/08/2019, 8:46 AM

## 2019-03-08 NOTE — Progress Notes (Signed)
[name], female   DOB: Aug 16, 1955, 64 y.o.   MRN: [id number] [facility] PROGRESS NOTE  Jamie Arias [id number] DOB: 11/15/1955 DOA: 02/09/2019 PCP: [name], MD  HPI/Subjective: When I went in the room this morning, patient look like she was choking.  Patient opened her eyes and looked at me but did not talk with me today.  Patient's breathing was better after I was in the room for a little while.  Patient was moving her right leg on her own.  Did not move to command today.  Objective: Vitals:   03/08/19 0617 03/08/19 1152  BP: (!) 106/52 136/79  Pulse: 84 84  Resp: 17 18  Temp: 98.5 F (36.9 C) 98.5 F (36.9 C)  SpO2: 100% 99%    Filed Weights   02/25/19 1200 03/02/19 0930 03/06/19 1500  Weight: 73.2 kg 75.5 kg 83 kg    ROS: Review of Systems  Unable to perform ROS: Acuity of condition  Constitutional: Negative for chills and fever.  Respiratory: Negative for cough.   Gastrointestinal: Negative for diarrhea and vomiting.   Exam: Physical Exam  Constitutional: She appears lethargic.  HENT:  Nose: No mucosal edema.  Eyes: Pupils are equal, round, and reactive to light. Conjunctivae and lids are normal.  Neck: Carotid bruit is not present.  Cardiovascular: Regular rhythm, S1 normal and S2 normal. Exam reveals no gallop.  No murmur heard. Respiratory: No respiratory distress. She has decreased breath sounds in the right lower field and the left lower field. She has no wheezes. She has no rhonchi. She has no rales.  GI: Soft. Bowel sounds are normal. There is no abdominal tenderness.  Musculoskeletal:     Right ankle: No swelling.     Left ankle: No swelling.  Lymphadenopathy:    She has no cervical adenopathy.  Neurological: She appears lethargic.  Did not answer any questions for me this morning  Skin: Skin is warm. Nails show no clubbing.  Left heel stage II decubitus ulcer with black eschar. Right heel stage II decubitus  ulcer As per nursing staff stage I decubitus ulcer sacrum Bruising upper extremities Wound VAC over right arm  Psychiatric:  Moved her right leg on her own.  Unresponsive to sternal rub for me.      Data Reviewed: Basic Metabolic Panel: Recent Labs  Lab 03/02/19 0329 03/03/19 0636 03/04/19 0417 03/05/19 0356 03/06/19 0345 03/07/19 0423 03/08/19 0700  NA 134* 139 138 137 134* 136 135  K 4.4 4.0 4.2 3.6 3.9 3.7 3.7  CL 96* 101 101 99 100 99 100  CO2 22 26 28 26 25 26 26   GLUCOSE 130* 125* 136* 102* 206* 191* 122*  BUN 28* 19 24* 14 23 19  30*  CREATININE 3.61* 2.44* 3.14* 2.16* 2.63* 1.88* 2.70*  CALCIUM 8.3* 8.3* 8.5* 8.5* 8.0* 8.2* 8.1*  MG 1.9 2.0 2.1 1.7 2.1  --  2.2  PHOS 2.9 1.8* 3.7 2.8 3.5  --   --    Liver Function Tests: Recent Labs  Lab 03/05/19 0356  AST 25  ALT 16  ALKPHOS 88  BILITOT 0.8  PROT 5.6*  ALBUMIN 1.9*   CBC: Recent Labs  Lab 03/04/19 0417 03/05/19 0356 03/06/19 0345 03/06/19 1526 03/07/19 0423 03/08/19 0700  WBC 6.7 9.7 7.2  --  8.9 8.9  HGB 8.1* 8.6* 6.8* 8.8* 7.5* 7.1*  HCT 24.4* 27.2* 21.7* 25.4* 22.1* 21.0*  MCV 89.7 94.4 94.8  --  87.0 88.6  PLT 196 225 171  --  173 183   Cardiac Enzymes: Recent Labs  Lab 03/02/19 0330  CKTOTAL 31*   BNP (last 3 results) Recent Labs    03/01/19 0429  BNP 720.0*     CBG: Recent Labs  Lab 03/07/19 2339 03/08/19 0418 03/08/19 0757 03/08/19 1151 03/08/19 1658  GLUCAP 181* 154* 121* 132* 170*     Studies: No results found.  Scheduled Meds: . aspirin  81 mg Oral Daily  . B-complex with vitamin C  1 tablet Per Tube Daily  . Chlorhexidine Gluconate Cloth  6 each Topical Q0600  . epoetin (EPOGEN/PROCRIT) injection  4,000 Units Intravenous Q M,W,F-HD  . feeding supplement (PRO-STAT SUGAR FREE 64)  30 mL Per Tube Daily  . fluticasone  2 spray Each Nare Daily  . free water  20 mL Per Tube Q4H  . insulin aspart  0-15 Units Subcutaneous Q4H  . levothyroxine  88 mcg Oral QAC  breakfast  . mouth rinse  15 mL Mouth Rinse BID  . midodrine  10 mg Oral TID WC  . pantoprazole (PROTONIX) IV  40 mg Intravenous Q12H  . venlafaxine  75 mg Oral BID WC  . zinc sulfate  220 mg Oral Daily   Continuous Infusions: . sodium chloride Stopped (03/01/19 0429)  . feeding supplement (OSMOLITE 1.5 CAL) 1,000 mL (03/06/19 1525)  . heparin 1,100 Units/hr (03/08/19 0946)  . vancomycin 750 mg (03/06/19 1657)    Assessment/Plan:   1. Sepsis secondary to MRSA bacteremia.  Currently on vancomycin.   Appreciate ID consultation.  Will need 6-week long course of IV therapy.  Vascular removed her graft right arm on 03/04/2019 which could be the source of infection. 2. Hypotension.  Patient taken off Levophed today and transferred to the floor.  Patient on high-dose midodrine.  Blood pressure still on the lower side. 3. Acute blood loss anemia.  Hemoglobin up to 7.5 after transfusion yesterday.  May end up needing another transfusion tomorrow if hemoglobin drops. 4. End-stage renal disease on hemodialysis.  Tunneled catheter placed by vascular.  Graft removal right arm on 03/04/2019 5. Acute metabolic encephalopathy.  Mental status still impaired but opens her eyes today to my voice.  MRI of the brain negative.  EEG negative.  Ammonia normal.  TSH normal.  In speaking with prior rounding physicians patient's mental status has been impaired for a while now.  Overall prognosis poor.  Patient is little more alert today 6. Left arm DVT.  Continue heparin. 7. Paroxysmal atrial  fibrillation on amiodarone.  Continue heparin 8. Chronic diastolic congestive heart failure.  Dialysis to manage fluid status. 9. Type 2 diabetes mellitus.  On sliding scale.  10. Hypothyroidism unspecified on Synthroid 11. Overall prognosis is poor.  Palliative care following.  Patient a DNR. 12. Diarrhea.  Hold off on stool studies at this time.  13. Present on admission, stage II decubiti right heel, left heel and stage I  decubiti on buttock 14. Nutrition: PEG placed 03/03/2019.  Continue tube feeding 15. Acute hypoxic respiratory failure.  Patient intubated for PEG procedure and extubated 03/05/2019.  As needed oxygen  Code Status:     Code Status Orders  (From admission, onward)         Start     Ordered   02/21/19 0930  Do not attempt resuscitation (DNR)  Continuous    Question Answer Comment  In the event of cardiac or respiratory ARREST Do not call a "code blue"  In the event of cardiac or respiratory ARREST Do not perform Intubation, CPR, defibrillation or ACLS   In the event of cardiac or respiratory ARREST Use medication by any route, position, wound care, and other measures to relive pain and suffering. May use oxygen, suction and manual treatment of airway obstruction as needed for comfort.      02/21/19 0932        Code Status History    Date Active Date Inactive Code Status Order ID Comments User Context   02/09/2019 1755 02/21/2019 0932 Full Code [id number]  Thornell Mule, MD ED   08/24/2018 0022 09/15/2018 1720 Full Code [id number]  Shela Leff, MD Inpatient   09/04/2017 1510 09/06/2017 1920 Full Code [id number]  Epifanio Lesches, MD ED   02/15/2017 1243 02/16/2017 2023 Full Code [id number]  Saundra Shelling, MD Inpatient   10/23/2015 0828 10/23/2015 0908 Full Code [id number]  Saundra Shelling, MD Inpatient   09/03/2015 0130 09/03/2015 1619 Full Code [id number]  Lance Coon, MD Inpatient   07/02/2013 1327 07/14/2013 1540 Full Code [id number]  Nani Skillern, PA-C Inpatient   06/29/2013 2153 07/02/2013 1327 Full Code [id number]  Larey Dresser, MD Inpatient   Advance Care Planning Activity     Family Communication: Spoke with husband on the phone Disposition Plan: Transitional care team to look into LTAC facility No significant change in patient's condition.  Consultants:  Nephrology  Infectious disease  Vascular surgery  Critical care  specialist  Antibiotics:  Vancomycin  Time spent: 25 minutes  [place]

## 2019-03-08 NOTE — Consult Note (Signed)
ANTICOAGULATION CONSULT NOTE  Pharmacy Consult for heparin Indication: DVT  Patient Measurements: Height: 5' 4.02" (162.6 cm) Weight: 182 lb 15.7 oz (83 kg) IBW/kg (Calculated) : 54.74 Heparin Dosing Weight: 71 kg  Vital Signs: Temp: 98.5 F (36.9 C) (01/03 0617) Temp Source: Oral (01/03 0617) BP: 106/52 (01/03 0617) Pulse Rate: 84 (01/03 0617)  Labs: Recent Labs    03/06/19 0345 03/06/19 1526 03/07/19 0423 03/08/19 0700  HGB 6.8* 8.8* 7.5* 7.1*  HCT 21.7* 25.4* 22.1* 21.0*  PLT 171  --  173 183  HEPARINUNFRC 0.34  --  0.48 0.36  CREATININE 2.63*  --  1.88* 2.70*  TROPONINIHS 14  --   --   --    Estimated Creatinine Clearance: 21.6 mL/min (A) (by C-G formula based on SCr of 2.7 mg/dL (H)).  Medical History: Past Medical History:  Diagnosis Date  . Anemia   . Chronic diastolic CHF (congestive heart failure) ([place])    a. Due to ischemic cardiomyopathy. EF as low as 35%, improved to normal s/p CABG; b. echo 07/06/13: EF 55-60%, no RWMAs, mod TR, trivial pericardial effusion not c/w tamponade physiology;  c. 10/2015 Echo: EF 65%, Gr1 DD, triv AI, mild MR, mildly dil LA, mod TR, PASP 64mmHg.  Marland Kitchen Coronary artery disease    a. NSTEMI 06/2013; b.cath: severe three-vessel CAD w/ EF 30% & mild-mod MR; c. s/p 3 vessel CABG 07/02/13 (LIMA-LAD, SVG-OM, and SVG-RPDA);  d. 10/2015 MV: no ischemia/infarct.  . Diabetes mellitus without complication ([place])   . Diabetic neuropathy ([place])   . Dialysis patient [facility])    MWF  . ESRD (end stage renal disease) ([place])    a. 12/2015 initiated - mwf dialysis.  Marland Kitchen GERD (gastroesophageal reflux disease)   . Hyperlipidemia   . Hypertension   . Hypothyroidism   . Myocardial infarction ([place]) 2015  . Neuropathy   . Pleural effusion 2015  . Pulmonary hypertension ([place])   . Renal insufficiency   . Wears dentures    full lower    Medications:  Scheduled:  . aspirin  81 mg Oral Daily  . B-complex with vitamin C  1 tablet Per Tube Daily  .  Chlorhexidine Gluconate Cloth  6 each Topical Q0600  . epoetin (EPOGEN/PROCRIT) injection  4,000 Units Intravenous Q M,W,F-HD  . feeding supplement (PRO-STAT SUGAR FREE 64)  30 mL Per Tube Daily  . fluticasone  2 spray Each Nare Daily  . free water  20 mL Per Tube Q4H  . insulin aspart  0-15 Units Subcutaneous Q4H  . levothyroxine  88 mcg Oral QAC breakfast  . mouth rinse  15 mL Mouth Rinse BID  . midodrine  10 mg Oral TID WC  . pantoprazole (PROTONIX) IV  40 mg Intravenous Q12H  . venlafaxine  75 mg Oral BID WC  . zinc sulfate  220 mg Oral Daily    Assessment: 64 y.o. female admitted on 02/09/2019 with MRSA  bacteremia. She was on apixaban PTA but last dose was prior to admission (12/7). [place] 12/15 revealed age-indeterminate short segment occlusive DVT involving the mid humeral aspect of one of the paired brachial veins.   Additionally, she was too confused and disoriented to swallow oral medications. On 12/20 a femoral central line was placed.    Goal of Therapy:  Heparin Level: 0.3 - 0.7 units/mL Monitor platelets by anticoagulation protocol: Yes   Plan:  01/03 @ 0500 HL 0.36 therapeutic. Will continue current rate (1100 units/hr) and will recheck HL w/ am  labs, hgb continues to trend low, will continue to monitor.  Eleonore Chiquito, PharmD, BCPS Clinical Pharmacist 03/08/2019 8:20 AM

## 2019-03-09 ENCOUNTER — Ambulatory Visit: Payer: Medicare Other | Admitting: Family Medicine

## 2019-03-09 DIAGNOSIS — U071 COVID-19: Secondary | ICD-10-CM

## 2019-03-09 LAB — COMPREHENSIVE METABOLIC PANEL
ALT: 7 U/L (ref 0–44)
AST: 18 U/L (ref 15–41)
Albumin: 1.9 g/dL — ABNORMAL LOW (ref 3.5–5.0)
Alkaline Phosphatase: 68 U/L (ref 38–126)
Anion gap: 10 (ref 5–15)
BUN: 39 mg/dL — ABNORMAL HIGH (ref 8–23)
CO2: 23 mmol/L (ref 22–32)
Calcium: 8.4 mg/dL — ABNORMAL LOW (ref 8.9–10.3)
Chloride: 101 mmol/L (ref 98–111)
Creatinine, Ser: 3.29 mg/dL — ABNORMAL HIGH (ref 0.44–1.00)
GFR calc Af Amer: 16 mL/min — ABNORMAL LOW (ref >60)
GFR calc non Af Amer: 14 mL/min — ABNORMAL LOW (ref >60)
Glucose, Bld: 141 mg/dL — ABNORMAL HIGH (ref 70–99)
Potassium: 3.8 mmol/L (ref 3.5–5.1)
Sodium: 134 mmol/L — ABNORMAL LOW (ref 135–145)
Total Bilirubin: 0.9 mg/dL (ref 0.3–1.2)
Total Protein: 4.9 g/dL — ABNORMAL LOW (ref 6.5–8.1)

## 2019-03-09 LAB — AEROBIC/ANAEROBIC CULTURE W GRAM STAIN (SURGICAL/DEEP WOUND)
Culture: NO GROWTH
Culture: NO GROWTH

## 2019-03-09 LAB — GLUCOSE, CAPILLARY
Glucose-Capillary: 111 mg/dL — ABNORMAL HIGH (ref 70–99)
Glucose-Capillary: 211 mg/dL — ABNORMAL HIGH (ref 70–99)
Glucose-Capillary: 94 mg/dL (ref 70–99)
Glucose-Capillary: 106 mg/dL — ABNORMAL HIGH (ref 70–99)
Glucose-Capillary: 188 mg/dL — ABNORMAL HIGH (ref 70–99)
Glucose-Capillary: 209 mg/dL — ABNORMAL HIGH (ref 70–99)

## 2019-03-09 LAB — RETIC PANEL
Immature Retic Fract: 37.7 % — ABNORMAL HIGH (ref 2.3–15.9)
RBC.: 2.43 MIL/uL — ABNORMAL LOW (ref 3.87–5.11)
Retic Count, Absolute: 91.6 10*3/uL (ref 19.0–186.0)
Retic Ct Pct: 3.8 % — ABNORMAL HIGH (ref 0.4–3.1)
Reticulocyte Hemoglobin: 39.3 pg (ref >27.9)

## 2019-03-09 LAB — CBC
HCT: 23.7 % — ABNORMAL LOW (ref 36.0–46.0)
Hemoglobin: 7.4 g/dL — ABNORMAL LOW (ref 12.0–15.0)
MCH: 29.4 pg (ref 26.0–34.0)
MCHC: 31.2 g/dL (ref 30.0–36.0)
MCV: 94 fL (ref 80.0–100.0)
Platelets: 233 10*3/uL (ref 150–400)
RBC: 2.52 MIL/uL — ABNORMAL LOW (ref 3.87–5.11)
RDW: 20.4 % — ABNORMAL HIGH (ref 11.5–15.5)
WBC: 9.1 10*3/uL (ref 4.0–10.5)
nRBC: 2.1 % — ABNORMAL HIGH (ref 0.0–0.2)

## 2019-03-09 LAB — HEMOGLOBIN AND HEMATOCRIT, BLOOD
HCT: 24.8 % — ABNORMAL LOW (ref 36.0–46.0)
Hemoglobin: 8 g/dL — ABNORMAL LOW (ref 12.0–15.0)

## 2019-03-09 LAB — HEPARIN LEVEL (UNFRACTIONATED)
Heparin Unfractionated: 0.19 IU/mL — ABNORMAL LOW (ref 0.30–0.70)
Heparin Unfractionated: 0.34 IU/mL (ref 0.30–0.70)
Heparin Unfractionated: 0.51 IU/mL (ref 0.30–0.70)

## 2019-03-09 LAB — PROTIME-INR
INR: 1.1 (ref 0.8–1.2)
Prothrombin Time: 14.1 seconds (ref 11.4–15.2)

## 2019-03-09 LAB — MAGNESIUM: Magnesium: 2.2 mg/dL (ref 1.7–2.4)

## 2019-03-09 LAB — VANCOMYCIN, RANDOM: Vancomycin Rm: 20

## 2019-03-09 LAB — LACTATE DEHYDROGENASE: LDH: 192 U/L (ref 98–192)

## 2019-03-09 NOTE — Consult Note (Signed)
ANTICOAGULATION CONSULT NOTE  Pharmacy Consult for heparin Indication: DVT  Patient Measurements: Height: 5' 4.02" (162.6 cm) Weight: 182 lb 15.7 oz (83 kg) IBW/kg (Calculated) : 54.74 Heparin Dosing Weight: 71 kg  Vital Signs: Temp: 98.8 F (37.1 C) (01/04 2053) Temp Source: Oral (01/04 2053) BP: 132/53 (01/04 2053) Pulse Rate: 85 (01/04 2053)  Labs: Recent Labs    03/07/19 0423 03/08/19 0700 03/09/19 0528 03/09/19 1241 03/09/19 1515 03/09/19 2300  HGB 7.5* 7.1* 7.4*  --  8.0*  --   HCT 22.1* 21.0* 23.7*  --  24.8*  --   PLT 173 183 233  --   --   --   LABPROT  --   --  14.1  --   --   --   INR  --   --  1.1  --   --   --   HEPARINUNFRC 0.48 0.36 0.19* 0.34  --  0.51  CREATININE 1.88* 2.70* 3.29*  --   --   --    Estimated Creatinine Clearance: 17.8 mL/min (A) (by C-G formula based on SCr of 3.29 mg/dL (H)).  Medical History: Past Medical History:  Diagnosis Date  . Anemia   . Chronic diastolic CHF (congestive heart failure) ([place])    a. Due to ischemic cardiomyopathy. EF as low as 35%, improved to normal s/p CABG; b. echo 07/06/13: EF 55-60%, no RWMAs, mod TR, trivial pericardial effusion not c/w tamponade physiology;  c. 10/2015 Echo: EF 65%, Gr1 DD, triv AI, mild MR, mildly dil LA, mod TR, PASP 82mmHg.  Marland Kitchen Coronary artery disease    a. NSTEMI 06/2013; b.cath: severe three-vessel CAD w/ EF 30% & mild-mod MR; c. s/p 3 vessel CABG 07/02/13 (LIMA-LAD, SVG-OM, and SVG-RPDA);  d. 10/2015 MV: no ischemia/infarct.  . Diabetes mellitus without complication ([place])   . Diabetic neuropathy ([place])   . Dialysis patient [facility])    MWF  . ESRD (end stage renal disease) ([place])    a. 12/2015 initiated - mwf dialysis.  Marland Kitchen GERD (gastroesophageal reflux disease)   . Hyperlipidemia   . Hypertension   . Hypothyroidism   . Myocardial infarction ([place]) 2015  . Neuropathy   . Pleural effusion 2015  . Pulmonary hypertension ([place])   . Renal insufficiency   . Wears dentures    full lower     Medications:  Scheduled:  . aspirin  81 mg Oral Daily  . B-complex with vitamin C  1 tablet Per Tube Daily  . Chlorhexidine Gluconate Cloth  6 each Topical Q0600  . epoetin (EPOGEN/PROCRIT) injection  4,000 Units Intravenous Q M,W,F-HD  . feeding supplement (PRO-STAT SUGAR FREE 64)  30 mL Per Tube Daily  . fluticasone  2 spray Each Nare Daily  . free water  20 mL Per Tube Q4H  . insulin aspart  0-15 Units Subcutaneous Q4H  . levothyroxine  88 mcg Oral QAC breakfast  . mouth rinse  15 mL Mouth Rinse BID  . midodrine  10 mg Oral TID WC  . pantoprazole (PROTONIX) IV  40 mg Intravenous Q12H  . venlafaxine  75 mg Oral BID WC  . zinc sulfate  220 mg Oral Daily    Assessment: 64 y.o. female admitted on 02/09/2019 with MRSA  bacteremia. She was on apixaban PTA but last dose was prior to admission (12/7). [place] 12/15 revealed age-indeterminate short segment occlusive DVT involving the mid humeral aspect of one of the paired brachial veins.   Additionally, she was too confused and  disoriented to swallow oral medications. On 12/20 a femoral central line was placed.    Goal of Therapy:  Heparin Level: 0.3 - 0.7 units/mL Monitor platelets by anticoagulation protocol: Yes   Plan:  01/04 @ 1322 HL 0.37  therapeutic. HGgb WNL. Per RN during rounds today, it was noted that patient had a small clot near clean that was cleaned. Prior to going to dialysis no other noted bleeding issues. Per dialysis RN, no bleeding issues.  Will contine rate of 1200 units/hr  and will recheck HL in 8 hours, H/h continues to trend low will continue to monitor.  Dr. Arbutus Ped notified by pharmacy of bleeding event.   1/14 @ 2300 HL 0.51, therapeutic x 2, H/H stable - continue current rate, HL and CBC daily  Hart Robinsons, PharmD Clinical Pharmacist  03/09/2019 11:29 PM

## 2019-03-09 NOTE — Progress Notes (Signed)
[facility]  ROUNDING NOTE   Subjective:  Patient seen and evaluated during hemodialysis. Tolerating well. Blood flow rate 400. Ultrafiltration target 1 kg.    Objective:  Vital signs in last 24 hours:  Temp:  [97.2 F (36.2 C)-99.1 F (37.3 C)] (P) 97.2 F (36.2 C) (01/04 0950) Pulse Rate:  [84-85] 85 (01/04 0514) Resp:  [16-18] 16 (01/04 0514) BP: (112-136)/(53-79) 112/53 (01/04 0514) SpO2:  [98 %-100 %] 100 % (01/04 0514)  Weight change:  Filed Weights   02/25/19 1200 03/02/19 0930 03/06/19 1500  Weight: 73.2 kg 75.5 kg 83 kg    Intake/Output: I/O last 3 completed shifts: In: 2325.6 [I.V.:514.6; Other:1305; NG/GT:506] Out: 950 [Stool:950]   Intake/Output this shift:  Total I/O In: -  Out: 300 [Stool:300]  Physical Exam: General: NAD, laying in bed  HEENT [place]/AT OM moist  Lungs:  Scatterd rhonchi, normal effort  Heart: Regular rate and rhythm  Abdomen:  Soft, nontender, PEG tube in place  Extremities: + peripheral edema b/l, feet in b/l soft support  Neurologic: Awake, alert  Access: Left IJ PC  Rectal rube in place   Basic Metabolic Panel: Recent Labs  Lab 03/03/19 0636 03/04/19 0417 03/05/19 0356 03/06/19 0345 03/07/19 0423 03/08/19 0700 03/09/19 0528  NA 139 138 137 134* 136 135 134*  K 4.0 4.2 3.6 3.9 3.7 3.7 3.8  CL 101 101 99 100 99 100 101  CO2 26 28 26 25 26 26 23   GLUCOSE 125* 136* 102* 206* 191* 122* 141*  BUN 19 24* 14 23 19  30* 39*  CREATININE 2.44* 3.14* 2.16* 2.63* 1.88* 2.70* 3.29*  CALCIUM 8.3* 8.5* 8.5* 8.0* 8.2* 8.1* 8.4*  MG 2.0 2.1 1.7 2.1  --  2.2 2.2  PHOS 1.8* 3.7 2.8 3.5  --   --   --     Liver Function Tests: Recent Labs  Lab 03/05/19 0356 03/09/19 0528  AST 25 18  ALT 16 7  ALKPHOS 88 68  BILITOT 0.8 0.9  PROT 5.6* 4.9*  ALBUMIN 1.9* 1.9*   No results for input(s): LIPASE, AMYLASE in the last 168 hours. No results for input(s): AMMONIA in the last 168 hours.  CBC: Recent Labs  Lab  03/05/19 0356 03/06/19 0345 03/06/19 1526 03/07/19 0423 03/08/19 0700 03/09/19 0528  WBC 9.7 7.2  --  8.9 8.9 9.1  HGB 8.6* 6.8* 8.8* 7.5* 7.1* 7.4*  HCT 27.2* 21.7* 25.4* 22.1* 21.0* 23.7*  MCV 94.4 94.8  --  87.0 88.6 94.0  PLT 225 171  --  173 183 233    Cardiac Enzymes: No results for input(s): CKTOTAL, CKMB, CKMBINDEX, TROPONINI in the last 168 hours.  BNP: Invalid input(s): POCBNP  CBG: Recent Labs  Lab 03/08/19 1658 03/08/19 2041 03/08/19 2343 03/09/19 0339 03/09/19 0854  GLUCAP 170* 134* 82 111* 36*    Microbiology: Results for orders placed or performed during the hospital encounter of 02/09/19  Culture, blood (routine x 2)     Status: Abnormal   Collection Time: 02/09/19  2:29 PM   Specimen: BLOOD LEFT ARM  Result Value Ref Range Status   Specimen Description   Final    BLOOD LEFT ARM Performed at [facility], [address] [place], [place] [address]    Special Requests   Final    BOTTLES DRAWN AEROBIC AND ANAEROBIC Blood Culture adequate volume Performed at [facility], [address], [place], [place] [address]    Culture  Setup Time  Final    GRAM POSITIVE COCCI IN BOTH AEROBIC AND ANAEROBIC BOTTLES CRITICAL VALUE NOTED.  VALUE IS CONSISTENT WITH PREVIOUSLY REPORTED AND CALLED VALUE. Performed at [facility], [address] [place], [place] [address]    Culture (A)  Final    STAPHYLOCOCCUS AUREUS SUSCEPTIBILITIES PERFORMED ON PREVIOUS CULTURE WITHIN THE LAST 5 DAYS. Performed at [facility], [place] [address] [place], [place] [address]    Report Status 02/12/2019 FINAL  Final  Culture, blood (routine x 2)     Status: Abnormal   Collection Time: 02/09/19  2:38 PM   Specimen: BLOOD LEFT ARM  Result Value Ref Range Status   Specimen Description   Final    BLOOD LEFT ARM Performed at [facility], [address] [place], [place] [address]    Special Requests   Final    BOTTLES DRAWN  AEROBIC AND ANAEROBIC Blood Culture adequate volume Performed at [facility], [address], [place], [place] [address]    Culture  Setup Time   Final    GRAM POSITIVE COCCI IN BOTH AEROBIC AND ANAEROBIC BOTTLES CRITICAL RESULT CALLED TO, READ BACK BY AND VERIFIED WITH: [place] [id number] 02/10/2019 HNM Performed at [facility], [place] [address], [place], [place] [address]    Culture METHICILLIN RESISTANT STAPHYLOCOCCUS AUREUS (A)  Final   Report Status 02/12/2019 FINAL  Final   Organism ID, Bacteria METHICILLIN RESISTANT STAPHYLOCOCCUS AUREUS  Final      Susceptibility   Methicillin resistant staphylococcus aureus - MIC*    CIPROFLOXACIN >=8 RESISTANT Resistant     ERYTHROMYCIN >=8 RESISTANT Resistant     GENTAMICIN <=0.5 SENSITIVE Sensitive     OXACILLIN >=4 RESISTANT Resistant     TETRACYCLINE <=1 SENSITIVE Sensitive     VANCOMYCIN 1 SENSITIVE Sensitive     TRIMETH/SULFA <=10 SENSITIVE Sensitive     CLINDAMYCIN RESISTANT Resistant     RIFAMPIN <=0.5 SENSITIVE Sensitive     Inducible Clindamycin POSITIVE Resistant     * METHICILLIN RESISTANT STAPHYLOCOCCUS AUREUS  Blood Culture ID Panel (Reflexed)     Status: Abnormal   Collection Time: 02/09/19  2:38 PM  Result Value Ref Range Status   Enterococcus species NOT DETECTED NOT DETECTED Final   Listeria monocytogenes NOT DETECTED NOT DETECTED Final   Staphylococcus species DETECTED (A) NOT DETECTED Final    Comment: CRITICAL RESULT CALLED TO, READ BACK BY AND VERIFIED WITH: Hart Robinsons PHARMD [id number] 02/10/2019 HNM    Staphylococcus aureus (BCID) DETECTED (A) NOT DETECTED Final    Comment: Methicillin (oxacillin)-resistant Staphylococcus aureus (MRSA). MRSA is predictably resistant to beta-lactam antibiotics (except ceftaroline). Preferred therapy is vancomycin unless clinically contraindicated. Patient requires contact precautions if  hospitalized. CRITICAL RESULT CALLED TO, READ BACK BY AND VERIFIED WITH: Hart Robinsons PHARMD [id number] 02/10/2019 HNM    Methicillin resistance DETECTED (A) NOT DETECTED Final    Comment: CRITICAL RESULT CALLED TO, READ BACK BY AND VERIFIED WITH: Hart Robinsons PHARMD [id number] 02/10/2019 HNM    Streptococcus species NOT DETECTED NOT DETECTED Final   Streptococcus agalactiae NOT DETECTED NOT DETECTED Final   Streptococcus pneumoniae NOT DETECTED NOT DETECTED Final   Streptococcus pyogenes NOT DETECTED NOT DETECTED Final   Acinetobacter baumannii NOT DETECTED NOT DETECTED Final   Enterobacteriaceae species NOT DETECTED NOT DETECTED Final   Enterobacter cloacae complex NOT DETECTED NOT DETECTED Final   Escherichia coli NOT DETECTED NOT DETECTED Final   Klebsiella oxytoca NOT DETECTED NOT DETECTED Final  Klebsiella pneumoniae NOT DETECTED NOT DETECTED Final   Proteus species NOT DETECTED NOT DETECTED Final   Serratia marcescens NOT DETECTED NOT DETECTED Final   Haemophilus influenzae NOT DETECTED NOT DETECTED Final   Neisseria meningitidis NOT DETECTED NOT DETECTED Final   Pseudomonas aeruginosa NOT DETECTED NOT DETECTED Final   Candida albicans NOT DETECTED NOT DETECTED Final   Candida glabrata NOT DETECTED NOT DETECTED Final   Candida krusei NOT DETECTED NOT DETECTED Final   Candida parapsilosis NOT DETECTED NOT DETECTED Final   Candida tropicalis NOT DETECTED NOT DETECTED Final    Comment: Performed at [facility], [place], [place] [address]  SARS CORONAVIRUS 2 (TAT 6-24 HRS) Nasopharyngeal Nasopharyngeal Swab     Status: None   Collection Time: 02/09/19  2:54 PM   Specimen: Nasopharyngeal Swab  Result Value Ref Range Status   SARS Coronavirus 2 NEGATIVE NEGATIVE Final    Comment: (NOTE) SARS-CoV-2 target nucleic acids are NOT DETECTED. The SARS-CoV-2 RNA is generally detectable in upper and lower respiratory specimens during the acute phase of infection. Negative results do not preclude SARS-CoV-2 infection, do not rule out co-infections with  other pathogens, and should not be used as the sole basis for treatment or other patient management decisions. Negative results must be combined with clinical observations, patient history, and epidemiological information. The expected result is Negative. Fact Sheet for Patients: SugarRoll.be Fact Sheet for Healthcare Providers: https://www.woods-mathews.com/ This test is not yet approved or cleared by the [place] FDA and  has been authorized for detection and/or diagnosis of SARS-CoV-2 by FDA under an Emergency Use Authorization (EUA). This EUA will remain  in effect (meaning this test can be used) for the duration of the COVID-19 declaration under Section 56 4(b)(1) of the Act, 21 U.S.C. section 360bbb-3(b)(1), unless the authorization is terminated or revoked sooner. Performed at [facility], [place] [address] [place], [place] [address]   MRSA PCR Screening     Status: Abnormal   Collection Time: 02/10/19  5:03 PM   Specimen: Nasopharyngeal  Result Value Ref Range Status   MRSA by PCR POSITIVE (A) NEGATIVE Final    Comment:        The GeneXpert MRSA Assay (FDA approved for NASAL specimens only), is one component of a comprehensive MRSA colonization surveillance program. It is not intended to diagnose MRSA infection nor to guide or monitor treatment for MRSA infections. RESULT CALLED TO, READ BACK BY AND VERIFIED WITH: ALEKSEY FRASER @1839  02/10/19 MJU Performed at [facility], [address] [place], [place] [address]   Culture, blood (routine x 2)     Status: Abnormal   Collection Time: 02/11/19 12:31 PM   Specimen: BLOOD  Result Value Ref Range Status   Specimen Description   Final    BLOOD PORTA CATH Performed at [place] [address] [place], [place] [address]    Special Requests   Final    BOTTLES DRAWN AEROBIC AND ANAEROBIC Blood Culture adequate volume Performed at [facility], [address] [place], [place] [address]    Culture  Setup Time   Final    GRAM POSITIVE COCCI ANAEROBIC BOTTLE ONLY CRITICAL RESULT CALLED TO, READ BACK BY AND VERIFIED WITH: [place] AT [id number] 02/12/2019 [facility]  Performed at [facility], [address] [place], [place] [address]    Culture (A)  Final    STAPHYLOCOCCUS AUREUS SUSCEPTIBILITIES PERFORMED ON PREVIOUS CULTURE WITHIN THE LAST  5 DAYS. Performed at [facility], [place] [address], [place], [place] [address]    Report Status 02/13/2019 FINAL  Final  Culture, blood (routine x 2)     Status: None   Collection Time: 02/11/19  3:57 PM   Specimen: BLOOD  Result Value Ref Range Status   Specimen Description BLOOD PORTA CATH  Final   Special Requests   Final    BOTTLES DRAWN AEROBIC AND ANAEROBIC Blood Culture adequate volume   Culture   Final    NO GROWTH 5 DAYS Performed at [facility], [address] [place], [place] [address]    Report Status 02/16/2019 FINAL  Final  CULTURE, BLOOD (ROUTINE X 2) w Reflex to ID Panel     Status: Abnormal   Collection Time: 02/13/19  2:18 PM   Specimen: BLOOD LEFT HAND  Result Value Ref Range Status   Specimen Description BLOOD LEFT HAND  Final   Special Requests   Final    BOTTLES DRAWN AEROBIC AND ANAEROBIC Blood Culture results may not be optimal due to an inadequate volume of blood received in culture bottles   Culture  Setup Time   Final    AEROBIC BOTTLE ONLY GRAM POSITIVE COCCI IN CLUSTERS CRITICAL RESULT CALLED TO, READ BACK BY AND VERIFIED WITH: Herbert Pun AT [id number] ON 02/14/2019 [place]. Performed at [facility], [place] [address] [place], [place] [address]    Culture METHICILLIN RESISTANT STAPHYLOCOCCUS AUREUS (A)  Final   Report Status 02/16/2019 FINAL  Final   Organism ID, Bacteria METHICILLIN RESISTANT STAPHYLOCOCCUS AUREUS  Final      Susceptibility   Methicillin resistant staphylococcus aureus - MIC*    CIPROFLOXACIN >=8 RESISTANT  Resistant     ERYTHROMYCIN >=8 RESISTANT Resistant     GENTAMICIN <=0.5 SENSITIVE Sensitive     OXACILLIN >=4 RESISTANT Resistant     TETRACYCLINE <=1 SENSITIVE Sensitive     VANCOMYCIN <=0.5 SENSITIVE Sensitive     TRIMETH/SULFA <=10 SENSITIVE Sensitive     CLINDAMYCIN RESISTANT Resistant     RIFAMPIN <=0.5 SENSITIVE Sensitive     Inducible Clindamycin POSITIVE Resistant     * METHICILLIN RESISTANT STAPHYLOCOCCUS AUREUS  Blood Culture ID Panel (Reflexed)     Status: Abnormal   Collection Time: 02/13/19  2:18 PM  Result Value Ref Range Status   Enterococcus species NOT DETECTED NOT DETECTED Final   Listeria monocytogenes NOT DETECTED NOT DETECTED Final   Staphylococcus species DETECTED (A) NOT DETECTED Final    Comment: CRITICAL RESULT CALLED TO, READ BACK BY AND VERIFIED WITH: ABBEY ELLINGTON AT [id number] ON 02/14/2019 [place].    Staphylococcus aureus (BCID) DETECTED (A) NOT DETECTED Final    Comment: Methicillin (oxacillin)-resistant Staphylococcus aureus (MRSA). MRSA is predictably resistant to beta-lactam antibiotics (except ceftaroline). Preferred therapy is vancomycin unless clinically contraindicated. Patient requires contact precautions if  hospitalized. CRITICAL RESULT CALLED TO, READ BACK BY AND VERIFIED WITH: Herbert Pun AT [id number] ON 02/14/2019 [place].    Methicillin resistance DETECTED (A) NOT DETECTED Final    Comment: CRITICAL RESULT CALLED TO, READ BACK BY AND VERIFIED WITH: Herbert Pun AT [id number] ON 02/14/2019 [place].    Streptococcus species NOT DETECTED NOT DETECTED Final   Streptococcus agalactiae NOT DETECTED NOT DETECTED Final   Streptococcus pneumoniae NOT DETECTED NOT DETECTED Final   Streptococcus pyogenes NOT DETECTED NOT DETECTED Final   Acinetobacter baumannii NOT DETECTED NOT DETECTED Final   Enterobacteriaceae species NOT DETECTED NOT DETECTED Final   Enterobacter  cloacae complex NOT DETECTED NOT DETECTED Final   Escherichia coli NOT DETECTED NOT DETECTED Final    Klebsiella oxytoca NOT DETECTED NOT DETECTED Final   Klebsiella pneumoniae NOT DETECTED NOT DETECTED Final   Proteus species NOT DETECTED NOT DETECTED Final   Serratia marcescens NOT DETECTED NOT DETECTED Final   Haemophilus influenzae NOT DETECTED NOT DETECTED Final   Neisseria meningitidis NOT DETECTED NOT DETECTED Final   Pseudomonas aeruginosa NOT DETECTED NOT DETECTED Final   Candida albicans NOT DETECTED NOT DETECTED Final   Candida glabrata NOT DETECTED NOT DETECTED Final   Candida krusei NOT DETECTED NOT DETECTED Final   Candida parapsilosis NOT DETECTED NOT DETECTED Final   Candida tropicalis NOT DETECTED NOT DETECTED Final    Comment: Performed at [facility], [address] [place], [place] [address]  CULTURE, BLOOD (ROUTINE X 2) w Reflex to ID Panel     Status: None   Collection Time: 02/13/19  3:09 PM   Specimen: BLOOD  Result Value Ref Range Status   Specimen Description BLOOD BLOOD LEFT HAND  Final   Special Requests   Final    BOTTLES DRAWN AEROBIC AND ANAEROBIC Blood Culture adequate volume   Culture   Final    NO GROWTH 5 DAYS Performed at [facility], [address], [place], [place] [address]    Report Status 02/18/2019 FINAL  Final  Culture, blood (routine x 2)     Status: Abnormal   Collection Time: 02/15/19 12:49 PM   Specimen: BLOOD  Result Value Ref Range Status   Specimen Description   Final    BLOOD LT HAND Performed at [facility], [address] [place], [place] [address]    Special Requests   Final    BOTTLES DRAWN AEROBIC AND ANAEROBIC Blood Culture results may not be optimal due to an inadequate volume of blood received in culture bottles Performed at [facility], [address] [place], [place] [address]    Culture  Setup Time   Final    GRAM POSITIVE COCCI IN BOTH AEROBIC AND ANAEROBIC BOTTLES CRITICAL RESULT CALLED TO, READ BACK BY AND VERIFIED WITHEleonore Chiquito AT [id number] 02/16/2019  SDR Performed at [facility], [address] [place], [place] [address]    Culture (A)  Final    METHICILLIN RESISTANT STAPHYLOCOCCUS AUREUS STAPHYLOCOCCUS SPECIES (COAGULASE NEGATIVE) THE SIGNIFICANCE OF ISOLATING THIS ORGANISM FROM A SINGLE SET OF BLOOD CULTURES WHEN MULTIPLE SETS ARE DRAWN IS UNCERTAIN. PLEASE NOTIFY THE MICROBIOLOGY DEPARTMENT WITHIN ONE WEEK IF SPECIATION AND SENSITIVITIES ARE REQUIRED. Performed at [facility], [place] [name] [place], [place] [address]    Report Status 02/18/2019 FINAL  Final   Organism ID, Bacteria METHICILLIN RESISTANT STAPHYLOCOCCUS AUREUS  Final      Susceptibility   Methicillin resistant staphylococcus aureus - MIC*    CIPROFLOXACIN >=8 RESISTANT Resistant     ERYTHROMYCIN >=8 RESISTANT Resistant     GENTAMICIN <=0.5 SENSITIVE Sensitive     OXACILLIN >=4 RESISTANT Resistant     TETRACYCLINE <=1 SENSITIVE Sensitive     VANCOMYCIN <=0.5 SENSITIVE Sensitive     TRIMETH/SULFA <=10 SENSITIVE Sensitive     CLINDAMYCIN RESISTANT Resistant     RIFAMPIN <=0.5 SENSITIVE Sensitive     Inducible Clindamycin POSITIVE Resistant     * METHICILLIN RESISTANT STAPHYLOCOCCUS AUREUS  Aerobic Culture (superficial specimen)     Status: None   Collection Time: 02/17/19  1:31 PM   Specimen: Heel; Wound  Result Value Ref Range Status   Specimen Description   Final    HEEL Performed at [facility], [address] [place], [place] [address]    Special Requests   Final    NONE Performed at [facility], [address] [place], [place] [address]    Gram Stain   Final    NO WBC SEEN MODERATE GRAM POSITIVE COCCI IN PAIRS    Culture   Final    ABUNDANT METHICILLIN RESISTANT STAPHYLOCOCCUS AUREUS ABUNDANT DIPHTHEROIDS(CORYNEBACTERIUM SPECIES) Standardized susceptibility testing for this organism is not available. Performed at [facility], [place] [address] [place], [place] [address]    Report Status 02/19/2019  FINAL  Final   Organism ID, Bacteria METHICILLIN RESISTANT STAPHYLOCOCCUS AUREUS  Final      Susceptibility   Methicillin resistant staphylococcus aureus - MIC*    CIPROFLOXACIN >=8 RESISTANT Resistant     ERYTHROMYCIN >=8 RESISTANT Resistant     GENTAMICIN <=0.5 SENSITIVE Sensitive     OXACILLIN >=4 RESISTANT Resistant     TETRACYCLINE <=1 SENSITIVE Sensitive     VANCOMYCIN 1 SENSITIVE Sensitive     TRIMETH/SULFA <=10 SENSITIVE Sensitive     CLINDAMYCIN RESISTANT Resistant     RIFAMPIN <=0.5 SENSITIVE Sensitive     Inducible Clindamycin POSITIVE Resistant     * ABUNDANT METHICILLIN RESISTANT STAPHYLOCOCCUS AUREUS  CULTURE, BLOOD (ROUTINE X 2) w Reflex to ID Panel     Status: None   Collection Time: 02/18/19 11:29 PM   Specimen: BLOOD  Result Value Ref Range Status   Specimen Description BLOOD LEFT ARM  Final   Special Requests   Final    BOTTLES DRAWN AEROBIC AND ANAEROBIC Blood Culture adequate volume   Culture   Final    NO GROWTH 5 DAYS Performed at [facility], [address] [place], [place] [address]    Report Status 02/23/2019 FINAL  Final  CULTURE, BLOOD (ROUTINE X 2) w Reflex to ID Panel     Status: None   Collection Time: 02/18/19 11:29 PM   Specimen: BLOOD  Result Value Ref Range Status   Specimen Description BLOOD LEFT ARM  Final   Special Requests   Final    BOTTLES DRAWN AEROBIC AND ANAEROBIC Blood Culture adequate volume   Culture   Final    NO GROWTH 5 DAYS Performed at [facility], [address] [place], [place] [address]    Report Status 02/23/2019 FINAL  Final  Aerobic/Anaerobic Culture (surgical/deep wound)     Status: None (Preliminary result)   Collection Time: 03/04/19 11:04 AM   Specimen: PATH Other; Tissue  Result Value Ref Range Status   Specimen Description   Final    ARTERY Performed at [facility], [address] [place], [place] [address]    Special Requests   Final    RIGHT AV GRAFT AND STENTS  CULTURE Performed at [facility], [address] [place], [place] [address]    Gram Stain   Final    RARE WBC PRESENT, PREDOMINANTLY PMN NO ORGANISMS SEEN    Culture   Final    NO GROWTH 4 DAYS NO ANAEROBES ISOLATED; CULTURE IN PROGRESS FOR 5 DAYS Performed at [place] [address] [place], [place] [address]    Report Status PENDING  Incomplete  Aerobic/Anaerobic Culture (surgical/deep wound)     Status: None (Preliminary result)   Collection Time: 03/04/19 11:29 AM   Specimen: PATH Other;  Tissue  Result Value Ref Range Status   Specimen Description   Final    ARTERY Performed at [facility], [address], [place], [place] [address]    Special Requests   Final    FLUID AROUND AV GRAFT Performed at [facility], [address] [place], [place] [address]    Gram Stain   Final    FEW WBC PRESENT, PREDOMINANTLY PMN NO ORGANISMS SEEN    Culture   Final    NO GROWTH 4 DAYS NO ANAEROBES ISOLATED; CULTURE IN PROGRESS FOR 5 DAYS Performed at [place] [address], [place], [place] [address]    Report Status PENDING  Incomplete    Coagulation Studies: Recent Labs    03/09/19 0528  LABPROT 14.1  INR 1.1    Urinalysis: No results for input(s): COLORURINE, LABSPEC, PHURINE, GLUCOSEU, HGBUR, BILIRUBINUR, KETONESUR, PROTEINUR, UROBILINOGEN, NITRITE, LEUKOCYTESUR in the last 72 hours.  Invalid input(s): APPERANCEUR    Imaging: No results found.   Medications:   . sodium chloride Stopped (03/01/19 0429)  . feeding supplement (OSMOLITE 1.5 CAL) 1,000 mL (03/06/19 1525)  . heparin 1,200 Units/hr (03/09/19 0906)  . vancomycin 750 mg (03/06/19 1657)   . aspirin  81 mg Oral Daily  . B-complex with vitamin C  1 tablet Per Tube Daily  . Chlorhexidine Gluconate Cloth  6 each Topical Q0600  . epoetin (EPOGEN/PROCRIT) injection  4,000 Units Intravenous Q M,W,F-HD  . feeding supplement (PRO-STAT SUGAR FREE 64)  30 mL  Per Tube Daily  . fluticasone  2 spray Each Nare Daily  . free water  20 mL Per Tube Q4H  . insulin aspart  0-15 Units Subcutaneous Q4H  . levothyroxine  88 mcg Oral QAC breakfast  . mouth rinse  15 mL Mouth Rinse BID  . midodrine  10 mg Oral TID WC  . pantoprazole (PROTONIX) IV  40 mg Intravenous Q12H  . venlafaxine  75 mg Oral BID WC  . zinc sulfate  220 mg Oral Daily   sodium chloride, ipratropium-albuterol, ondansetron (ZOFRAN) IV  Assessment/ Plan:  Jamie Arias is a 64 y.o. white female with end-stage renal disease on hemodialysis, atrial fibrillation, chronic diastolic congestive heart failure, coronary artery disease status post CABG, diabetes mellitus type II, hypertension, hyperlipidemia, hypothyroidism, Covid positive in July 2020, recent nursing home stay in [place] Prolonged hospitalization for MRSA bacteremia/sepsis and now with acute encephalopathy  [facility] [address] Left AVG 81.5kg  1. End Stage Renal Disease: on hemodialysis. With complication of dialysis device.  -rt arm AVG removed 12/30 due to suspected source of bacteremia -Patient seen and evaluated during hemodialysis.  Tolerating well via left IJ PermCath.  Ultrafiltration target 1 kg.  2. Hypotension -Continue midodrine 10 mg p.o. 3 times daily.  3. Anemia of chronic kidney disease:   Status post PRBC transfusion on 12/22 -Continue Epogen 4000 units IV with dialysis treatments.  4. Secondary Hyperparathyroidism:  not currently on binders.  Lab Results  Component Value Date   PTH 135 (H) 02/22/2017   CALCIUM 8.4 (L) 03/09/2019   CAION 1.20 07/03/2013   PHOS 3.5 03/06/2019    5. Acute Encephalopathy:  -Remains encephalopathic, arousable but confused.  6. Sepsis from MRSA - Abx as per ID team - Source of MRSA has not been identified -AV graft  removed 12/30  7. A Fib -Management as per hospitalist and cardiology.  8. DM with CKD Insulin dependent Lab Results  Component  Value Date   HGBA1C 5.3 02/09/2019   9. Left arm DVT Dx 12/9  10.  Acute respiratory failure Patient doing well post extubation at the moment.     LOS: 28 [address] [place] 1/4/202110:02 AM

## 2019-03-09 NOTE — Progress Notes (Signed)
This note also relates to the following rows which could not be included: Pulse Rate - Cannot attach notes to unvalidated device data Resp - Cannot attach notes to unvalidated device data BP - Cannot attach notes to unvalidated device data  Hd started  

## 2019-03-09 NOTE — Consult Note (Signed)
ANTICOAGULATION CONSULT NOTE  Pharmacy Consult for heparin Indication: DVT  Patient Measurements: Height: 5' 4.02" (162.6 cm) Weight: 182 lb 15.7 oz (83 kg) IBW/kg (Calculated) : 54.74 Heparin Dosing Weight: 71 kg  Vital Signs: Temp: 98.6 F (37 C) (01/04 0514) Temp Source: Oral (01/04 0514) BP: 112/53 (01/04 0514) Pulse Rate: 85 (01/04 0514)  Labs: Recent Labs    03/07/19 0423 03/08/19 0700 03/09/19 0528  HGB 7.5* 7.1* 7.4*  HCT 22.1* 21.0* 23.7*  PLT 173 183 233  LABPROT  --   --  14.1  INR  --   --  1.1  HEPARINUNFRC 0.48 0.36 0.19*  CREATININE 1.88* 2.70* 3.29*   Estimated Creatinine Clearance: 17.8 mL/min (A) (by C-G formula based on SCr of 3.29 mg/dL (H)).  Medical History: Past Medical History:  Diagnosis Date  . Anemia   . Chronic diastolic CHF (congestive heart failure) ([place])    a. Due to ischemic cardiomyopathy. EF as low as 35%, improved to normal s/p CABG; b. echo 07/06/13: EF 55-60%, no RWMAs, mod TR, trivial pericardial effusion not c/w tamponade physiology;  c. 10/2015 Echo: EF 65%, Gr1 DD, triv AI, mild MR, mildly dil LA, mod TR, PASP 32mmHg.  Marland Kitchen Coronary artery disease    a. NSTEMI 06/2013; b.cath: severe three-vessel CAD w/ EF 30% & mild-mod MR; c. s/p 3 vessel CABG 07/02/13 (LIMA-LAD, SVG-OM, and SVG-RPDA);  d. 10/2015 MV: no ischemia/infarct.  . Diabetes mellitus without complication ([place])   . Diabetic neuropathy ([place])   . Dialysis patient [facility])    MWF  . ESRD (end stage renal disease) ([place])    a. 12/2015 initiated - mwf dialysis.  Marland Kitchen GERD (gastroesophageal reflux disease)   . Hyperlipidemia   . Hypertension   . Hypothyroidism   . Myocardial infarction ([place]) 2015  . Neuropathy   . Pleural effusion 2015  . Pulmonary hypertension ([place])   . Renal insufficiency   . Wears dentures    full lower    Medications:  Scheduled:  . aspirin  81 mg Oral Daily  . B-complex with vitamin C  1 tablet Per Tube Daily  . Chlorhexidine Gluconate Cloth  6 each  Topical Q0600  . epoetin (EPOGEN/PROCRIT) injection  4,000 Units Intravenous Q M,W,F-HD  . feeding supplement (PRO-STAT SUGAR FREE 64)  30 mL Per Tube Daily  . fluticasone  2 spray Each Nare Daily  . free water  20 mL Per Tube Q4H  . insulin aspart  0-15 Units Subcutaneous Q4H  . levothyroxine  88 mcg Oral QAC breakfast  . mouth rinse  15 mL Mouth Rinse BID  . midodrine  10 mg Oral TID WC  . pantoprazole (PROTONIX) IV  40 mg Intravenous Q12H  . venlafaxine  75 mg Oral BID WC  . zinc sulfate  220 mg Oral Daily    Assessment: 64 y.o. female admitted on 02/09/2019 with MRSA  bacteremia. She was on apixaban PTA but last dose was prior to admission (12/7). [place] 12/15 revealed age-indeterminate short segment occlusive DVT involving the mid humeral aspect of one of the paired brachial veins.   Additionally, she was too confused and disoriented to swallow oral medications. On 12/20 a femoral central line was placed.    Goal of Therapy:  Heparin Level: 0.3 - 0.7 units/mL Monitor platelets by anticoagulation protocol: Yes   Plan:  01/04 @ 1322 HL 0.37  therapeutic. HGgb WNL. Per RN during rounds today, it was noted that patient had a small clot near clean  that was cleaned. Prior to going to dialysis no other noted bleeding issues. Per dialysis RN, no bleeding issues.  Will contine rate of 1200 units/hr  and will recheck HL in 8 hours, H/h continues to trend low will continue to monitor.  Dr. Arbutus Ped notified by pharmacy of bleeding event.   Kristeen Miss, PharmD Clinical Pharmacist 03/09/2019 7:37 AM

## 2019-03-09 NOTE — Progress Notes (Addendum)
[name], female   DOB: 1955/06/02, 64 y.o.   MRN: [id number] [facility] PROGRESS NOTE  Jamie Arias [id number] DOB: 04-16-1955 DOA: 02/09/2019 PCP: [name], MD  HPI/Subjective: Patient seen today, sleeping comfortably.  Falls back to sleep.  Nonverbal.  Appears in no distress. Has dialysis today.  No acute events reported.  Objective: Vitals:   03/09/19 1250 03/09/19 1426  BP:  (!) 112/59  Pulse: 78 81  Resp: 16   Temp: 98 F (36.7 C) 98.8 F (37.1 C)  SpO2:  100%    Filed Weights   02/25/19 1200 03/02/19 0930 03/06/19 1500  Weight: 73.2 kg 75.5 kg 83 kg     Exam: Physical Exam  Constitutional: awake, alert, no acute distress  HEENT: moist mucus membranes, hearing grossly normal Cardiovascular: Regular rhythm, S1 normal and S2 normal. Exam reveals no gallop. No murmur heard. Respiratory: No respiratory distress. Diminished but clear without wheezes, rales or rhonchi GI: Soft non-tender, non-distended. Bowel sounds are normal. Neurological: CN's grossly intact, Normal speech Skin: dry, intact, normal temperature Extremities:  Left heel stage II decubitus ulcer with black eschar. Right heel stage II decubitus ulcer As per nursing staff stage I decubitus ulcer sacrum Bruising upper extremities Wound VAC over right arm     Data Reviewed: Basic Metabolic Panel: Recent Labs  Lab 03/03/19 0636 03/04/19 0417 03/05/19 0356 03/06/19 0345 03/07/19 0423 03/08/19 0700 03/09/19 0528  NA 139 138 137 134* 136 135 134*  K 4.0 4.2 3.6 3.9 3.7 3.7 3.8  CL 101 101 99 100 99 100 101  CO2 26 28 26 25 26 26 23   GLUCOSE 125* 136* 102* 206* 191* 122* 141*  BUN 19 24* 14 23 19  30* 39*  CREATININE 2.44* 3.14* 2.16* 2.63* 1.88* 2.70* 3.29*  CALCIUM 8.3* 8.5* 8.5* 8.0* 8.2* 8.1* 8.4*  MG 2.0 2.1 1.7 2.1  --  2.2 2.2  PHOS 1.8* 3.7 2.8 3.5  --   --   --    Liver Function Tests: Recent Labs  Lab 03/05/19 0356 03/09/19 0528  AST 25 18   ALT 16 7  ALKPHOS 88 68  BILITOT 0.8 0.9  PROT 5.6* 4.9*  ALBUMIN 1.9* 1.9*   CBC: Recent Labs  Lab 03/05/19 0356 03/06/19 0345 03/06/19 1526 03/07/19 0423 03/08/19 0700 03/09/19 0528 03/09/19 1515  WBC 9.7 7.2  --  8.9 8.9 9.1  --   HGB 8.6* 6.8* 8.8* 7.5* 7.1* 7.4* 8.0*  HCT 27.2* 21.7* 25.4* 22.1* 21.0* 23.7* 24.8*  MCV 94.4 94.8  --  87.0 88.6 94.0  --   PLT 225 171  --  173 183 233  --    Cardiac Enzymes: No results for input(s): CKTOTAL, CKMB, CKMBINDEX, TROPONINI in the last 168 hours. BNP (last 3 results) Recent Labs    03/01/19 0429  BNP 720.0*     CBG: Recent Labs  Lab 03/08/19 2343 03/09/19 0339 03/09/19 0854 03/09/19 1420 03/09/19 1623  GLUCAP 82 111* 211* 94 106*     Studies: No results found.  Scheduled Meds: . aspirin  81 mg Oral Daily  . B-complex with vitamin C  1 tablet Per Tube Daily  . Chlorhexidine Gluconate Cloth  6 each Topical Q0600  . epoetin (EPOGEN/PROCRIT) injection  4,000 Units Intravenous Q M,W,F-HD  . feeding supplement (PRO-STAT SUGAR FREE 64)  30 mL Per Tube Daily  . fluticasone  2 spray Each Nare Daily  . free water  20 mL Per  Tube Q4H  . insulin aspart  0-15 Units Subcutaneous Q4H  . levothyroxine  88 mcg Oral QAC breakfast  . mouth rinse  15 mL Mouth Rinse BID  . midodrine  10 mg Oral TID WC  . pantoprazole (PROTONIX) IV  40 mg Intravenous Q12H  . venlafaxine  75 mg Oral BID WC  . zinc sulfate  220 mg Oral Daily   Continuous Infusions: . sodium chloride Stopped (03/01/19 0429)  . feeding supplement (OSMOLITE 1.5 CAL) 1,000 mL (03/06/19 1525)  . heparin 1,200 Units/hr (03/09/19 0906)  . vancomycin Stopped (03/09/19 1424)    Assessment/Plan:   1. Sepsis secondary to MRSA bacteremia.  Currently on vancomycin.   Appreciate ID consultation.  Will need 6-week long course of IV therapy.  Vascular removed her graft right arm on 03/04/2019 which could be the source of infection. 2. Hypotension.  Patient taken off  Levophed today and transferred to the floor.  Patient on high-dose midodrine.  Blood pressure still on the lower side. 3. Acute blood loss anemia.  Hemoglobin up to 7.5 after transfusion yesterday.  May end up needing another transfusion tomorrow if hemoglobin drops. 4. End-stage renal disease on hemodialysis.  Tunneled catheter placed by vascular.  Graft removal right arm on 03/04/2019 5. Acute metabolic encephalopathy.  Mental status still impaired but opens her eyes today to my voice.  MRI of the brain negative.  EEG negative.  Ammonia normal.  TSH normal.  In speaking with prior rounding physicians patient's mental status has been impaired for a while now.  Overall prognosis poor.  Patient is little more alert today 6. Left arm DVT.  Continue heparin. 7. Paroxysmal atrial  fibrillation on amiodarone.  Continue heparin 8. Chronic diastolic congestive heart failure.  Dialysis to manage fluid status. 9. Type 2 diabetes mellitus.  On sliding scale.  10. Hypothyroidism unspecified on Synthroid 11. Overall prognosis is poor.  Palliative care following.  Patient a DNR. 12. Diarrhea.  Hold off on stool studies at this time.  13. Present on admission, stage II decubiti right heel, left heel and stage I decubiti on buttock 14. Nutrition: PEG placed 03/03/2019.  Continue tube feeding 15. Acute hypoxic respiratory failure.  Patient intubated for PEG procedure and extubated 03/05/2019.  As needed oxygen  Code Status:     Code Status Orders  (From admission, onward)         Start     Ordered   02/21/19 0930  Do not attempt resuscitation (DNR)  Continuous    Question Answer Comment  In the event of cardiac or respiratory ARREST Do not call a "code blue"   In the event of cardiac or respiratory ARREST Do not perform Intubation, CPR, defibrillation or ACLS   In the event of cardiac or respiratory ARREST Use medication by any route, position, wound care, and other measures to relive pain and suffering.  May use oxygen, suction and manual treatment of airway obstruction as needed for comfort.      02/21/19 0932        Code Status History    Date Active Date Inactive Code Status Order ID Comments User Context   02/09/2019 [id number] 02/21/2019 0932 Full Code [id number]  Thornell Mule, MD ED   08/24/2018 0022 09/15/2018 1720 Full Code [id number]  Shela Leff, MD Inpatient   09/04/2017 1510 09/06/2017 1920 Full Code [id number]  Epifanio Lesches, MD ED   02/15/2017 1243 02/16/2017 2023 Full Code [id number]  Saundra Shelling, MD Inpatient   10/23/2015  [id number] 10/23/2015 0908 Full Code [id number]  Saundra Shelling, MD Inpatient   09/03/2015 0130 09/03/2015 1619 Full Code [id number]  Lance Coon, MD Inpatient   07/02/2013 1327 07/14/2013 1540 Full Code [id number]  Arnoldo Lenis Inpatient   06/29/2013 2153 07/02/2013 1327 Full Code [id number]  Larey Dresser, MD Inpatient   Advance Care Planning Activity     Family Communication: none at bedside Disposition Plan: Transitional care team working on LTAC placement No significant change in patient's condition.  Consultants:  Nephrology  Infectious disease  Vascular surgery  Critical care specialist  Antibiotics:  Vancomycin  Time spent: 35 minutes  [place]

## 2019-03-09 NOTE — Progress Notes (Signed)
This note also relates to the following rows which could not be included: Pulse Rate - Cannot attach notes to unvalidated device data Resp - Cannot attach notes to unvalidated device data  Hd completed  

## 2019-03-09 NOTE — Progress Notes (Signed)
ID  Non verbal somnolent  Patient Vitals for the past 24 hrs:  BP Temp Temp src Pulse Resp SpO2  03/09/19 1000 126/65 -- -- 82 15 --  03/09/19 0950 -- (!) 97.2 F (36.2 C) Oral -- -- --  03/09/19 0514 (!) 112/53 98.6 F (37 C) Oral 85 16 100 %  03/08/19 2045 (!) 120/55 99.1 F (37.3 C) Oral 84 16 98 %  03/08/19 1152 136/79 98.5 F (36.9 C) Oral 84 18 99 %    Left IJ dialysis catheter Left femoral line Rt arm dresisng not removed Bruising b/l arms Peg- getting feeds Rectal tube losse stool Abd soft  CBC Latest Ref Rng & Units 03/09/2019 03/08/2019 03/07/2019  WBC 4.0 - 10.5 K/uL 9.1 8.9 8.9  Hemoglobin 12.0 - 15.0 g/dL 7.4(L) 7.1(L) 7.5(L)  Hematocrit 36.0 - 46.0 % 23.7(L) 21.0(L) 22.1(L)  Platelets 150 - 400 K/uL 233 183 173    CMP Latest Ref Rng & Units 03/09/2019 03/08/2019 03/07/2019  Glucose 70 - 99 mg/dL 141(H) 122(H) 191(H)  BUN 8 - 23 mg/dL 39(H) 30(H) 19  Creatinine 0.44 - 1.00 mg/dL 3.29(H) 2.70(H) 1.88(H)  Sodium 135 - 145 mmol/L 134(L) 135 136  Potassium 3.5 - 5.1 mmol/L 3.8 3.7 3.7  Chloride 98 - 111 mmol/L 101 100 99  CO2 22 - 32 mmol/L 23 26 26   Calcium 8.9 - 10.3 mg/dL 8.4(L) 8.1(L) 8.2(L)  Total Protein 6.5 - 8.1 g/dL 4.9(L) - -  Total Bilirubin 0.3 - 1.2 mg/dL 0.9 - -  Alkaline Phos 38 - 126 U/L 68 - -  AST 15 - 41 U/L 18 - -  ALT 0 - 44 U/L 7 - -    Microbiology Aerobic culture 12/30 of the AV graft - no growth  02/18/19 : Bc NG  IMPRESSION/RECOMMENDATION  MRSA BACTEREMIA Initially persistent for a week and vanco was switched to dapto+ ceftaroline then- ( vanco MIC was good  at 1) Blood culture from 02/18/19 neg- AV graft was considered to be infected as It was malfunctioning and also had stents and it was removed on 03/04/19. MRI of the spine showed no lesions TEE could not be done due to encephalopathy Pt on IV vancomycin will need until 04/01/19  ENCEPHALOPATHY- unknown cause- MRI no acute lesions- could it be due to long COVID- will check crypto,  quantiferon gold, toxo, CMV, EBV dna NH3 negative- ? Check thiamine CSF examination may be beneficial to look for any lymphocytic pleocytosis and r/o chronic indolent infection/ paraneoplastic syndromes, NMDAR?? Autoimmune encephalitis  ESRD on dialysis  Has left femoral line- now that she is out of ICU and BP maintained recommend removing the line  PEG in place  Discussed the management with her husband

## 2019-03-09 NOTE — Consult Note (Signed)
Pharmacy Antibiotic Note  Jamie Arias is a 64 y.o. female admitted on 02/09/2019 with methicillin resistant Staphylococcus aureus.  Pharmacy has been consulted for vancomycin dosing (previous antibiotics included daptomycin and ceftaroline) She recently underwent PEG placement and AVG removal.  Patient received vancomycin loading dose 1500 mg x1 with dialysis on 12/24.  (per ID: Post covid morbidity- Long COVID)    Plan:  Maintenance dose:  Vancomycin 750mg  IV qHD MWF  Patient received dose 12/28 with HD, but missed dose on 12/30 due to surgery.  However, patient received shorter 2 hour HD session on 12/30.  Based on pharmacokinetic estimation, patient is likely subtherapeutic (calculated level ~18).  Therefore, give vancomycin 500 mg x1 dose tonight.  Plan to draw random vancomycin level tomorrow before HD (12/31).   Goal pre-HD vanco level is 20 to 25 per Dr. Delaine [name].  1/1:  Vancomycin level pre-HD= 24 mcg/ml.  Continue current therapy. Pt received dose with HD. F/u next Vanc level pre-HD with 3rd HD from this level (Wed 1/6 ?)  1/4:  Vancomycin level pre-HD= 20 mcg/ml. Continue current therapy. Pt received dose with HD. F/u next Vanc level pre-HD with 3rd HD from this level (Wed 1/6) as scheduled.    Height: 5' 4.02" (162.6 cm) Weight: 182 lb 15.7 oz (83 kg) IBW/kg (Calculated) : 54.74  Temp (24hrs), Avg:98.7 F (37.1 C), Min:98.5 F (36.9 C), Max:99.1 F (37.3 C)  Recent Labs  Lab 03/05/19 0356 03/06/19 0345 03/07/19 0423 03/08/19 0700 03/09/19 0528  WBC 9.7 7.2 8.9 8.9 9.1  CREATININE 2.16* 2.63* 1.88* 2.70* 3.29*  VANCORANDOM  --  24  --   --  20    Estimated Creatinine Clearance: 17.8 mL/min (A) (by C-G formula based on SCr of 3.29 mg/dL (H)).    Antimicrobials this admission: ceftaroline 12/14 >> 12/21 daptomycin 12/14 >> 12/26 Vancomycin 12/24 >>  Microbiology results: 12/30 Aerobic/Anaerobic Cx:  NG 12/16 BCx: NG 12/15 WCx MRSA 12/13 BCx: MRSA    Thank you for allowing pharmacy to be a part of this patient's care.  Kristeen Miss, PharmD Clinical Pharmacist  03/09/2019 7:38 AM

## 2019-03-09 NOTE — Consult Note (Signed)
ANTICOAGULATION CONSULT NOTE  Pharmacy Consult for heparin Indication: DVT  Patient Measurements: Height: 5' 4.02" (162.6 cm) Weight: 182 lb 15.7 oz (83 kg) IBW/kg (Calculated) : 54.74 Heparin Dosing Weight: 71 kg  Vital Signs: Temp: 98.6 F (37 C) (01/04 0514) Temp Source: Oral (01/04 0514) BP: 112/53 (01/04 0514) Pulse Rate: 85 (01/04 0514)  Labs: Recent Labs    03/07/19 0423 03/08/19 0700 03/09/19 0528  HGB 7.5* 7.1* 7.4*  HCT 22.1* 21.0* 23.7*  PLT 173 183 233  LABPROT  --   --  14.1  INR  --   --  1.1  HEPARINUNFRC 0.48 0.36 0.19*  CREATININE 1.88* 2.70* 3.29*   Estimated Creatinine Clearance: 17.8 mL/min (A) (by C-G formula based on SCr of 3.29 mg/dL (H)).  Medical History: Past Medical History:  Diagnosis Date  . Anemia   . Chronic diastolic CHF (congestive heart failure) ([place])    a. Due to ischemic cardiomyopathy. EF as low as 35%, improved to normal s/p CABG; b. echo 07/06/13: EF 55-60%, no RWMAs, mod TR, trivial pericardial effusion not c/w tamponade physiology;  c. 10/2015 Echo: EF 65%, Gr1 DD, triv AI, mild MR, mildly dil LA, mod TR, PASP 56mmHg.  Marland Kitchen Coronary artery disease    a. NSTEMI 06/2013; b.cath: severe three-vessel CAD w/ EF 30% & mild-mod MR; c. s/p 3 vessel CABG 07/02/13 (LIMA-LAD, SVG-OM, and SVG-RPDA);  d. 10/2015 MV: no ischemia/infarct.  . Diabetes mellitus without complication ([place])   . Diabetic neuropathy ([place])   . Dialysis patient [facility])    MWF  . ESRD (end stage renal disease) ([place])    a. 12/2015 initiated - mwf dialysis.  Marland Kitchen GERD (gastroesophageal reflux disease)   . Hyperlipidemia   . Hypertension   . Hypothyroidism   . Myocardial infarction ([place]) 2015  . Neuropathy   . Pleural effusion 2015  . Pulmonary hypertension ([place])   . Renal insufficiency   . Wears dentures    full lower    Medications:  Scheduled:  . aspirin  81 mg Oral Daily  . B-complex with vitamin C  1 tablet Per Tube Daily  . Chlorhexidine Gluconate Cloth  6 each  Topical Q0600  . epoetin (EPOGEN/PROCRIT) injection  4,000 Units Intravenous Q M,W,F-HD  . feeding supplement (PRO-STAT SUGAR FREE 64)  30 mL Per Tube Daily  . fluticasone  2 spray Each Nare Daily  . free water  20 mL Per Tube Q4H  . insulin aspart  0-15 Units Subcutaneous Q4H  . levothyroxine  88 mcg Oral QAC breakfast  . mouth rinse  15 mL Mouth Rinse BID  . midodrine  10 mg Oral TID WC  . pantoprazole (PROTONIX) IV  40 mg Intravenous Q12H  . venlafaxine  75 mg Oral BID WC  . zinc sulfate  220 mg Oral Daily    Assessment: 64 y.o. female admitted on 02/09/2019 with MRSA  bacteremia. She was on apixaban PTA but last dose was prior to admission (12/7). [place] 12/15 revealed age-indeterminate short segment occlusive DVT involving the mid humeral aspect of one of the paired brachial veins.   Additionally, she was too confused and disoriented to swallow oral medications. On 12/20 a femoral central line was placed.    Goal of Therapy:  Heparin Level: 0.3 - 0.7 units/mL Monitor platelets by anticoagulation protocol: Yes   Plan:  01/04 @ 0500 HL 0.19 subtherapeutic. Per RN drip was only stopped 10 mins to draw blood, patient is also bleeding around insertion site from  central line. Will increase rate to 1200 units/hr w/o bolus given bleeding and will recheck HL @ 1200, H/h continues to trend low will continue to monitor.  Tobie Lords, PharmD, BCPS Clinical Pharmacist 03/09/2019 6:41 AM

## 2019-03-09 NOTE — Progress Notes (Signed)
[facility] Daily Progress Note   Subjective: 03/04/19: 1.   Removal of right arm AV graft with primary reconstruction of right brachial artery and incisional VAC placement  02/23/19: 1.  Right brachial artery to axillary vein arteriovenous graft cannulation under ultrasound guidance 2.  Right arm Shuntogram 3. Ultrasound guidance for vascular access to the left internal jugular vein 4. Fluoroscopic guidance for placement of catheter 5. Placement of a 19 cm tip to cuff tunneled hemodialysis catheter via the left internal jugular vein  Patient seen and examined during dialysis. Lethargic. Minimally responsive to commands. NAD.   Objective: Vitals:   03/09/19 1030 03/09/19 1045 03/09/19 1100 03/09/19 1115  BP: (!) 109/58 109/65 108/61 113/65  Pulse: 82 84 82 80  Resp: 19 16 18 17   Temp:      TempSrc:      SpO2:      Weight:      Height:        Intake/Output Summary (Last 24 hours) at 03/09/2019 1153 Last data filed at 03/09/2019 0703 Gross per 24 hour  Intake 2325.6 ml  Output 550 ml  Net 1775.6 ml   Physical Exam: NAD, Lethargic Chest:  Left IJ Permcath:   Intact, clean and dry. Functioning well.  CV: RRR Pulmonary: CTA Bilaterally Abdomen: Soft, Nontender, Nondistended Vascular:  Right Upper Extremity: Both incisions are clean and dry. Staples are intact. Some bruising noted. Upper arm soft. Lower arm still edematous. Hand warm. Fingers with good capillary refill.      Photos    03/09/2019 11:43  Attached To:  Hospital Encounter on 02/09/19  Source Information Leonides Schanz  [facility]   Laboratory: CBC    Component Value Date/Time   WBC 9.1 03/09/2019 0528   HGB 7.4 (L) 03/09/2019 0528   HGB 10.5 (L) 07/23/2013 0741   HCT 23.7 (L) 03/09/2019 0528   HCT 27.6 (L) 02/25/2019 0845   PLT 233 03/09/2019 0528   PLT 275 07/23/2013 0741   BMET    Component Value Date/Time   NA 134 (L) 03/09/2019 0528   NA 140  03/30/2014 1611   K 3.8 03/09/2019 0528   K 4.3 03/30/2014 1611   CL 101 03/09/2019 0528   CL 105 03/30/2014 1611   CO2 23 03/09/2019 0528   CO2 29 03/30/2014 1611   GLUCOSE 141 (H) 03/09/2019 0528   GLUCOSE 187 (H) 03/30/2014 1611   BUN 39 (H) 03/09/2019 0528   BUN 21 (H) 03/30/2014 1611   CREATININE 3.29 (H) 03/09/2019 0528   CREATININE 3.14 (H) 09/09/2015 1534   CALCIUM 8.4 (L) 03/09/2019 0528   CALCIUM 9.0 03/30/2014 1611   GFRNONAA 14 (L) 03/09/2019 0528   GFRNONAA 36 (L) 03/30/2014 1611   GFRNONAA >60 07/23/2013 0741   GFRAA 16 (L) 03/09/2019 0528   GFRAA 44 (L) 03/30/2014 1611   GFRAA >60 07/23/2013 0741   Assessment/Planning: The patient is a 64 year old female admitted with sepsis secondary to MSRA bacteremia s/p excision of RUE graft POD#5 1) ESRD:   03/04/19: Removal of right arm AV graft with primary reconstruction of right brachial artery and incisional VAC placement. RUE healing well. Daily dressing changes. Recommend elevation as forearm is edematous. LIJ permcath functioning well.   Discussed with Dr. Ellis Parents Jerold Yoss PA-C 03/09/2019 11:53 AM

## 2019-03-10 ENCOUNTER — Inpatient Hospital Stay: Payer: Self-pay

## 2019-03-10 LAB — CBC
HCT: 23.9 % — ABNORMAL LOW (ref 36.0–46.0)
Hemoglobin: 7.4 g/dL — ABNORMAL LOW (ref 12.0–15.0)
MCH: 29.8 pg (ref 26.0–34.0)
MCHC: 31 g/dL (ref 30.0–36.0)
MCV: 96.4 fL (ref 80.0–100.0)
Platelets: 261 10*3/uL (ref 150–400)
RBC: 2.48 MIL/uL — ABNORMAL LOW (ref 3.87–5.11)
RDW: 20.6 % — ABNORMAL HIGH (ref 11.5–15.5)
WBC: 9.1 10*3/uL (ref 4.0–10.5)
nRBC: 2.1 % — ABNORMAL HIGH (ref 0.0–0.2)

## 2019-03-10 LAB — GLUCOSE, CAPILLARY
Glucose-Capillary: 271 mg/dL — ABNORMAL HIGH (ref 70–99)
Glucose-Capillary: 269 mg/dL — ABNORMAL HIGH (ref 70–99)
Glucose-Capillary: 211 mg/dL — ABNORMAL HIGH (ref 70–99)
Glucose-Capillary: 200 mg/dL — ABNORMAL HIGH (ref 70–99)
Glucose-Capillary: 299 mg/dL — ABNORMAL HIGH (ref 70–99)

## 2019-03-10 LAB — CRYPTOCOCCUS ANTIGEN, SERUM: Cryptococcus Antigen, Serum: NEGATIVE

## 2019-03-10 LAB — TOXOPLASMA ANTIBODIES- IGG AND  IGM
Toxoplasma Antibody- IgM: <3 AU/mL (ref 0.0–7.9)
Toxoplasma IgG Ratio: <3 IU/mL (ref 0.0–7.1)

## 2019-03-10 LAB — HEPARIN LEVEL (UNFRACTIONATED): Heparin Unfractionated: 0.44 IU/mL (ref 0.30–0.70)

## 2019-03-10 MED ORDER — PSYLLIUM 95 % PO PACK
1.0000 | PACK | Freq: Two times a day (BID) | ORAL | Status: DC
  Administered 2019-03-10 – 2019-03-15 (×11): 1
  Filled 2019-03-10 (×13): qty 1

## 2019-03-10 NOTE — Consult Note (Signed)
ANTICOAGULATION CONSULT NOTE  Pharmacy Consult for heparin Indication: DVT  Patient Measurements: Height: 5' 4.02" (162.6 cm) Weight: 182 lb 15.7 oz (83 kg) IBW/kg (Calculated) : 54.74 Heparin Dosing Weight: 71 kg  Vital Signs: Temp: 99.1 F (37.3 C) (01/05 0504) Temp Source: Oral (01/05 0504) BP: 132/50 (01/05 0504) Pulse Rate: 85 (01/05 0504)  Labs: Recent Labs    03/08/19 0700 03/09/19 0528 03/09/19 1241 03/09/19 1515 03/09/19 2300 03/10/19 0600 03/10/19 0851  HGB 7.1* 7.4*  --  8.0*  --  7.4*  --   HCT 21.0* 23.7*  --  24.8*  --  23.9*  --   PLT 183 233  --   --   --  261  --   LABPROT  --  14.1  --   --   --   --   --   INR  --  1.1  --   --   --   --   --   HEPARINUNFRC 0.36 0.19* 0.34  --  0.51  --  0.44  CREATININE 2.70* 3.29*  --   --   --   --   --    Estimated Creatinine Clearance: 17.8 mL/min (A) (by C-G formula based on SCr of 3.29 mg/dL (H)).  Medical History: Past Medical History:  Diagnosis Date  . Anemia   . Chronic diastolic CHF (congestive heart failure) ([place])    a. Due to ischemic cardiomyopathy. EF as low as 35%, improved to normal s/p CABG; b. echo 07/06/13: EF 55-60%, no RWMAs, mod TR, trivial pericardial effusion not c/w tamponade physiology;  c. 10/2015 Echo: EF 65%, Gr1 DD, triv AI, mild MR, mildly dil LA, mod TR, PASP 93mmHg.  Marland Kitchen Coronary artery disease    a. NSTEMI 06/2013; b.cath: severe three-vessel CAD w/ EF 30% & mild-mod MR; c. s/p 3 vessel CABG 07/02/13 (LIMA-LAD, SVG-OM, and SVG-RPDA);  d. 10/2015 MV: no ischemia/infarct.  . Diabetes mellitus without complication ([place])   . Diabetic neuropathy ([place])   . Dialysis patient [facility])    MWF  . ESRD (end stage renal disease) ([place])    a. 12/2015 initiated - [facility] dialysis.  Marland Kitchen GERD (gastroesophageal reflux disease)   . Hyperlipidemia   . Hypertension   . Hypothyroidism   . Myocardial infarction ([place]) 2015  . Neuropathy   . Pleural effusion 2015  . Pulmonary hypertension ([place])   . Renal  insufficiency   . Wears dentures    full lower    Medications:  Scheduled:  . aspirin  81 mg Oral Daily  . B-complex with vitamin C  1 tablet Per Tube Daily  . Chlorhexidine Gluconate Cloth  6 each Topical Q0600  . epoetin (EPOGEN/PROCRIT) injection  4,000 Units Intravenous Q M,W,F-HD  . feeding supplement (PRO-STAT SUGAR FREE 64)  30 mL Per Tube Daily  . fluticasone  2 spray Each Nare Daily  . free water  20 mL Per Tube Q4H  . insulin aspart  0-15 Units Subcutaneous Q4H  . levothyroxine  88 mcg Oral QAC breakfast  . mouth rinse  15 mL Mouth Rinse BID  . midodrine  10 mg Oral TID WC  . pantoprazole (PROTONIX) IV  40 mg Intravenous Q12H  . venlafaxine  75 mg Oral BID WC  . zinc sulfate  220 mg Oral Daily    Assessment: 64 y.o. female admitted on 02/09/2019 with MRSA  bacteremia. She was on apixaban PTA but last dose was prior to admission (12/7). [place] 12/15 revealed  age-indeterminate short segment occlusive DVT involving the mid humeral aspect of one of the paired brachial veins.   Additionally, she was too confused and disoriented to swallow oral medications. On 12/20 a femoral central line was placed.    Goal of Therapy:  Heparin Level: 0.3 - 0.7 units/mL Monitor platelets by anticoagulation protocol: Yes   Plan:  01/05 @ 0930 HL 0.44  Therapeutic. Confirmed with RN no bleeding events noted today. Will contine heparin rate of 1200 units/hr and will recheck HL with AM labs, H/h continues to trend low will continue to monitor yet stable.   03/10/2019 9:58 AM

## 2019-03-10 NOTE — Consult Note (Signed)
Reason for Consult: AMS Referring Physician: Dr. Posey Pronto   CC: AMS  HPI: Jamie Arias is an 64 y.o. female medical history significant ofESRD on HDMWF,anemia, chronic diastolic congestive heart failure, CAD status post PCI, type 2 diabetes, hypertension, hyperlipidemia, hypothyroidism who has been admitted for about 1 month presenting via EMS after she was found to be unresponsive.  Patient had prior admission with Covid PNA. Since admission she has not woken up. Patient is here with prolonged hospitalization of MRSA bacteremia and is on vancomycin . MRI brain no acute abnormalities to explain current findings.    Past Medical History:  Diagnosis Date  . Anemia   . Chronic diastolic CHF (congestive heart failure) ([place])    a. Due to ischemic cardiomyopathy. EF as low as 35%, improved to normal s/p CABG; b. echo 07/06/13: EF 55-60%, no RWMAs, mod TR, trivial pericardial effusion not c/w tamponade physiology;  c. 10/2015 Echo: EF 65%, Gr1 DD, triv AI, mild MR, mildly dil LA, mod TR, PASP 34mmHg.  Marland Kitchen Coronary artery disease    a. NSTEMI 06/2013; b.cath: severe three-vessel CAD w/ EF 30% & mild-mod MR; c. s/p 3 vessel CABG 07/02/13 (LIMA-LAD, SVG-OM, and SVG-RPDA);  d. 10/2015 MV: no ischemia/infarct.  . Diabetes mellitus without complication ([place])   . Diabetic neuropathy ([place])   . Dialysis patient [facility])    MWF  . ESRD (end stage renal disease) ([place])    a. 12/2015 initiated - mwf dialysis.  Marland Kitchen GERD (gastroesophageal reflux disease)   . Hyperlipidemia   . Hypertension   . Hypothyroidism   . Myocardial infarction ([place]) 2015  . Neuropathy   . Pleural effusion 2015  . Pulmonary hypertension ([place])   . Renal insufficiency   . Wears dentures    full lower    Past Surgical History:  Procedure Laterality Date  . A/V FISTULAGRAM Right 12/17/2016   Procedure: A/V Fistulagram;  Surgeon: Algernon Huxley, MD;  Location: [place] [facility];  Service: Cardiovascular;  Laterality: Right;  . A/V FISTULAGRAM  Right 01/07/2017   Procedure: A/V Fistulagram;  Surgeon: Algernon Huxley, MD;  Location: [place] [facility];  Service: Cardiovascular;  Laterality: Right;  . A/V FISTULAGRAM Right 12/03/2017   Procedure: A/V FISTULAGRAM;  Surgeon: Katha Cabal, MD;  Location: [place] [facility];  Service: Cardiovascular;  Laterality: Right;  . A/V FISTULAGRAM Right 02/23/2019   Procedure: A/V Fistulagram;  Surgeon: Algernon Huxley, MD;  Location: [place] [facility];  Service: Cardiovascular;  Laterality: Right;  . A/V SHUNT INTERVENTION N/A 02/22/2017   Procedure: A/V SHUNT INTERVENTION;  Surgeon: Algernon Huxley, MD;  Location: [place] [facility];  Service: Cardiovascular;  Laterality: N/A;  . AV FISTULA PLACEMENT Right 02/03/2016   Procedure: INSERTION OF ARTERIOVENOUS (AV) GORE-TEX GRAFT ARM ( BRACH / AXILLARY );  Surgeon: Katha Cabal, MD;  Location: [facility];  Service: Vascular;  Laterality: Right;  . [facility] REMOVAL Right 03/04/2019   Procedure: REMOVAL OF ARTERIOVENOUS GORETEX GRAFT ([place]);  Surgeon: Algernon Huxley, MD;  Location: [facility];  Service: Vascular;  Laterality: Right;  . CARDIAC CATHETERIZATION    . CATARACT EXTRACTION Bilateral   . CHOLECYSTECTOMY N/A 12/09/2014   Procedure: LAPAROSCOPIC CHOLECYSTECTOMY;  Surgeon: Marlyce [name], MD;  Location: [facility];  Service: General;  Laterality: N/A;  . CORONARY ARTERY BYPASS GRAFT N/A 07/02/2013   Procedure: CORONARY ARTERY BYPASS GRAFTING (CABG);  Surgeon: Ivin [name], MD;  Location: [place];  Service: Open Heart Surgery;  Laterality: N/A;  CABG x three, using left internal mammary artery and right leg greater saphenous vein harvested endoscopically  . ESOPHAGOGASTRODUODENOSCOPY (EGD) WITH PROPOFOL N/A 11/24/2015   Procedure: ESOPHAGOGASTRODUODENOSCOPY (EGD) WITH PROPOFOL;  Surgeon: [name], MD;  Location: [place];  Service: Endoscopy;  Laterality: N/A;  Diabetic - insulin  . EYE SURGERY Bilateral    Cataract Extraction with  IOL  . INTRAOPERATIVE TRANSESOPHAGEAL ECHOCARDIOGRAM N/A 07/02/2013   Procedure: INTRAOPERATIVE TRANSESOPHAGEAL ECHOCARDIOGRAM;  Surgeon: Ivin [name], MD;  Location: [place];  Service: Open Heart Surgery;  Laterality: N/A;  . PEG PLACEMENT N/A 03/03/2019   Procedure: PERCUTANEOUS ENDOSCOPIC GASTROSTOMY (PEG) PLACEMENT;  Surgeon: Virgel Manifold, MD;  Location: [facility];  Service: Endoscopy;  Laterality: N/A;  . PERIPHERAL VASCULAR CATHETERIZATION Right 12/06/2015   Procedure: Dialysis/Perma Catheter Insertion;  Surgeon: Katha Cabal, MD;  Location: [place] [facility];  Service: Cardiovascular;  Laterality: Right;  . PERIPHERAL VASCULAR THROMBECTOMY Right 09/28/2016   Procedure: Peripheral Vascular Thrombectomy;  Surgeon: Katha Cabal, MD;  Location: [place] [facility];  Service: Cardiovascular;  Laterality: Right;  . PERIPHERAL VASCULAR THROMBECTOMY Right 05/20/2018   Procedure: PERIPHERAL VASCULAR THROMBECTOMY;  Surgeon: Katha Cabal, MD;  Location: [place] [facility];  Service: Cardiovascular;  Laterality: Right;  . PORTA CATH REMOVAL N/A 06/01/2016   Procedure: Glori [name] Cath Removal;  Surgeon: Katha Cabal, MD;  Location: [place] [facility];  Service: Cardiovascular;  Laterality: N/A;  . THORACENTESIS Left 2015    Family History  Problem Relation Age of Onset  . COPD Mother   . Cancer Mother        Lung  . Pulmonary embolism Father   . Diabetes Father   . Diabetes Paternal Grandfather   . Heart disease Maternal Grandmother   . Colon cancer Neg Hx   . Colon polyps Neg Hx   . Esophageal cancer Neg Hx   . Pancreatic cancer Neg Hx   . Liver disease Neg Hx     Social History:  reports that she has never smoked. She has never used smokeless tobacco. She reports that she does not drink alcohol or use drugs.  Allergies  Allergen Reactions  . Nsaids Other (See Comments)    Contraindicated due to kidney disease.  Marland Kitchen Doxycycline Other (See Comments)     tremor    Medications: I have reviewed the patient's current medications.  ROS: Unable to obtain as not following commands   Physical Examination: Blood pressure (!) 132/50, pulse 85, temperature 99.1 F (37.3 C), temperature source Oral, resp. rate 18, height 5' 4.02" (1.626 m), weight 83 kg, SpO2 100 %.  Pt opens eyes to voice Spontaneous movement all her extremities but doesn't clearly follow commands Generalized weakness Tries to mubble at times but not coherent.    Laboratory Studies:   Basic Metabolic Panel: Recent Labs  Lab 03/04/19 0417 03/05/19 0356 03/06/19 0345 03/07/19 0423 03/08/19 0700 03/09/19 0528  NA 138 137 134* 136 135 134*  K 4.2 3.6 3.9 3.7 3.7 3.8  CL 101 99 100 99 100 101  CO2 28 26 25 26 26 23   GLUCOSE 136* 102* 206* 191* 122* 141*  BUN 24* 14 23 19  30* 39*  CREATININE 3.14* 2.16* 2.63* 1.88* 2.70* 3.29*  CALCIUM 8.5* 8.5* 8.0* 8.2* 8.1* 8.4*  MG 2.1 1.7 2.1  --  2.2 2.2  PHOS 3.7 2.8 3.5  --   --   --     Liver Function  Tests: Recent Labs  Lab 03/05/19 0356 03/09/19 0528  AST 25 18  ALT 16 7  ALKPHOS 88 68  BILITOT 0.8 0.9  PROT 5.6* 4.9*  ALBUMIN 1.9* 1.9*   No results for input(s): LIPASE, AMYLASE in the last 168 hours. No results for input(s): AMMONIA in the last 168 hours.  CBC: Recent Labs  Lab 03/06/19 0345 03/07/19 0423 03/08/19 0700 03/09/19 0528 03/09/19 1515 03/10/19 0600  WBC 7.2 8.9 8.9 9.1  --  9.1  HGB 6.8* 7.5* 7.1* 7.4* 8.0* 7.4*  HCT 21.7* 22.1* 21.0* 23.7* 24.8* 23.9*  MCV 94.8 87.0 88.6 94.0  --  96.4  PLT 171 173 183 233  --  261    Cardiac Enzymes: No results for input(s): CKTOTAL, CKMB, CKMBINDEX, TROPONINI in the last 168 hours.  BNP: Invalid input(s): POCBNP  CBG: Recent Labs  Lab 03/09/19 2053 03/09/19 2315 03/10/19 0500 03/10/19 0742 03/10/19 1132  GLUCAP 188* 209* 271* 37* 13*    Microbiology: Results for orders placed or performed during the hospital encounter of 02/09/19   Culture, blood (routine x 2)     Status: Abnormal   Collection Time: 02/09/19  2:29 PM   Specimen: BLOOD LEFT ARM  Result Value Ref Range Status   Specimen Description   Final    BLOOD LEFT ARM Performed at [facility], [address], [place], [place] [address]    Special Requests   Final    BOTTLES DRAWN AEROBIC AND ANAEROBIC Blood Culture adequate volume Performed at [facility], [address] [place], [place] [address]    Culture  Setup Time   Final    GRAM POSITIVE COCCI IN BOTH AEROBIC AND ANAEROBIC BOTTLES CRITICAL VALUE NOTED.  VALUE IS CONSISTENT WITH PREVIOUSLY REPORTED AND CALLED VALUE. Performed at [facility], [address] [place], [place] [address]    Culture (A)  Final    STAPHYLOCOCCUS AUREUS SUSCEPTIBILITIES PERFORMED ON PREVIOUS CULTURE WITHIN THE LAST 5 DAYS. Performed at [facility], [place] [address], [place], [place] [address]    Report Status 02/12/2019 FINAL  Final  Culture, blood (routine x 2)     Status: Abnormal   Collection Time: 02/09/19  2:38 PM   Specimen: BLOOD LEFT ARM  Result Value Ref Range Status   Specimen Description   Final    BLOOD LEFT ARM Performed at [facility], [address], [place], [place] [address]    Special Requests   Final    BOTTLES DRAWN AEROBIC AND ANAEROBIC Blood Culture adequate volume Performed at [facility], [address], [place], [place] [address]    Culture  Setup Time   Final    GRAM POSITIVE COCCI IN BOTH AEROBIC AND ANAEROBIC BOTTLES CRITICAL RESULT CALLED TO, READ BACK BY AND VERIFIED WITH: [place] [id number] 02/10/2019 HNM Performed at [facility], [place] [address], [place], [place] [address]    Culture METHICILLIN RESISTANT STAPHYLOCOCCUS AUREUS (A)  Final   Report Status 02/12/2019 FINAL  Final   Organism ID, Bacteria METHICILLIN RESISTANT STAPHYLOCOCCUS AUREUS  Final      Susceptibility   Methicillin resistant staphylococcus  aureus - MIC*    CIPROFLOXACIN >=8 RESISTANT Resistant     ERYTHROMYCIN >=8 RESISTANT Resistant     GENTAMICIN <=0.5 SENSITIVE Sensitive     OXACILLIN >=4 RESISTANT Resistant     TETRACYCLINE <=1 SENSITIVE Sensitive     VANCOMYCIN 1 SENSITIVE Sensitive     TRIMETH/SULFA <=  10 SENSITIVE Sensitive     CLINDAMYCIN RESISTANT Resistant     RIFAMPIN <=0.5 SENSITIVE Sensitive     Inducible Clindamycin POSITIVE Resistant     * METHICILLIN RESISTANT STAPHYLOCOCCUS AUREUS  Blood Culture ID Panel (Reflexed)     Status: Abnormal   Collection Time: 02/09/19  2:38 PM  Result Value Ref Range Status   Enterococcus species NOT DETECTED NOT DETECTED Final   Listeria monocytogenes NOT DETECTED NOT DETECTED Final   Staphylococcus species DETECTED (A) NOT DETECTED Final    Comment: CRITICAL RESULT CALLED TO, READ BACK BY AND VERIFIED WITH: Hart Robinsons PHARMD [id number] 02/10/2019 HNM    Staphylococcus aureus (BCID) DETECTED (A) NOT DETECTED Final    Comment: Methicillin (oxacillin)-resistant Staphylococcus aureus (MRSA). MRSA is predictably resistant to beta-lactam antibiotics (except ceftaroline). Preferred therapy is vancomycin unless clinically contraindicated. Patient requires contact precautions if  hospitalized. CRITICAL RESULT CALLED TO, READ BACK BY AND VERIFIED WITH: Hart Robinsons PHARMD [id number] 02/10/2019 HNM    Methicillin resistance DETECTED (A) NOT DETECTED Final    Comment: CRITICAL RESULT CALLED TO, READ BACK BY AND VERIFIED WITH: Hart Robinsons PHARMD [id number] 02/10/2019 HNM    Streptococcus species NOT DETECTED NOT DETECTED Final   Streptococcus agalactiae NOT DETECTED NOT DETECTED Final   Streptococcus pneumoniae NOT DETECTED NOT DETECTED Final   Streptococcus pyogenes NOT DETECTED NOT DETECTED Final   Acinetobacter baumannii NOT DETECTED NOT DETECTED Final   Enterobacteriaceae species NOT DETECTED NOT DETECTED Final   Enterobacter cloacae complex NOT DETECTED NOT DETECTED Final   Escherichia coli NOT  DETECTED NOT DETECTED Final   Klebsiella oxytoca NOT DETECTED NOT DETECTED Final   Klebsiella pneumoniae NOT DETECTED NOT DETECTED Final   Proteus species NOT DETECTED NOT DETECTED Final   Serratia marcescens NOT DETECTED NOT DETECTED Final   Haemophilus influenzae NOT DETECTED NOT DETECTED Final   Neisseria meningitidis NOT DETECTED NOT DETECTED Final   Pseudomonas aeruginosa NOT DETECTED NOT DETECTED Final   Candida albicans NOT DETECTED NOT DETECTED Final   Candida glabrata NOT DETECTED NOT DETECTED Final   Candida krusei NOT DETECTED NOT DETECTED Final   Candida parapsilosis NOT DETECTED NOT DETECTED Final   Candida tropicalis NOT DETECTED NOT DETECTED Final    Comment: Performed at [facility], [place], [place] [address]  SARS CORONAVIRUS 2 (TAT 6-24 HRS) Nasopharyngeal Nasopharyngeal Swab     Status: None   Collection Time: 02/09/19  2:54 PM   Specimen: Nasopharyngeal Swab  Result Value Ref Range Status   SARS Coronavirus 2 NEGATIVE NEGATIVE Final    Comment: (NOTE) SARS-CoV-2 target nucleic acids are NOT DETECTED. The SARS-CoV-2 RNA is generally detectable in upper and lower respiratory specimens during the acute phase of infection. Negative results do not preclude SARS-CoV-2 infection, do not rule out co-infections with other pathogens, and should not be used as the sole basis for treatment or other patient management decisions. Negative results must be combined with clinical observations, patient history, and epidemiological information. The expected result is Negative. Fact Sheet for Patients: SugarRoll.be Fact Sheet for Healthcare Providers: https://www.woods-mathews.com/ This test is not yet approved or cleared by the [place] FDA and  has been authorized for detection and/or diagnosis of SARS-CoV-2 by FDA under an Emergency Use Authorization (EUA). This EUA will remain  in effect (meaning this  test can be used) for the duration of the COVID-19 declaration under Section 56 4(b)(1) of the Act, 21 U.S.C. section 360bbb-3(b)(1), unless the authorization is terminated or revoked  sooner. Performed at [facility], [place] [address], [place], [place] [address]   MRSA PCR Screening     Status: Abnormal   Collection Time: 02/10/19  5:03 PM   Specimen: Nasopharyngeal  Result Value Ref Range Status   MRSA by PCR POSITIVE (A) NEGATIVE Final    Comment:        The GeneXpert MRSA Assay (FDA approved for NASAL specimens only), is one component of a comprehensive MRSA colonization surveillance program. It is not intended to diagnose MRSA infection nor to guide or monitor treatment for MRSA infections. RESULT CALLED TO, READ BACK BY AND VERIFIED WITH: ALEKSEY FRASER @1839  02/10/19 MJU Performed at [facility], [address] [place], [place] [address]   Culture, blood (routine x 2)     Status: Abnormal   Collection Time: 02/11/19 12:31 PM   Specimen: BLOOD  Result Value Ref Range Status   Specimen Description   Final    BLOOD PORTA CATH Performed at [place] [address] [place], [place] [address]    Special Requests   Final    BOTTLES DRAWN AEROBIC AND ANAEROBIC Blood Culture adequate volume Performed at [facility], [address] [place], [place] [address]    Culture  Setup Time   Final    GRAM POSITIVE COCCI ANAEROBIC BOTTLE ONLY CRITICAL RESULT CALLED TO, READ BACK BY AND VERIFIED WITH: SCOTT HALL AT [id number] 02/12/2019 [facility]  Performed at [facility], [address] [place], [place] [address]    Culture (A)  Final    STAPHYLOCOCCUS AUREUS SUSCEPTIBILITIES PERFORMED ON PREVIOUS CULTURE WITHIN THE LAST 5 DAYS. Performed at [facility], [place] [address], [place], [place] [address]    Report Status 02/13/2019 FINAL  Final  Culture, blood (routine x 2)     Status: None   Collection Time: 02/11/19  3:57 PM   Specimen:  BLOOD  Result Value Ref Range Status   Specimen Description BLOOD PORTA CATH  Final   Special Requests   Final    BOTTLES DRAWN AEROBIC AND ANAEROBIC Blood Culture adequate volume   Culture   Final    NO GROWTH 5 DAYS Performed at [facility], [address] [place], [place] [address]    Report Status 02/16/2019 FINAL  Final  CULTURE, BLOOD (ROUTINE X 2) w Reflex to ID Panel     Status: Abnormal   Collection Time: 02/13/19  2:18 PM   Specimen: BLOOD LEFT HAND  Result Value Ref Range Status   Specimen Description BLOOD LEFT HAND  Final   Special Requests   Final    BOTTLES DRAWN AEROBIC AND ANAEROBIC Blood Culture results may not be optimal due to an inadequate volume of blood received in culture bottles   Culture  Setup Time   Final    AEROBIC BOTTLE ONLY GRAM POSITIVE COCCI IN CLUSTERS CRITICAL RESULT CALLED TO, READ BACK BY AND VERIFIED WITH: Herbert Pun AT [id number] ON 02/14/2019 [place]. Performed at [facility], [place] [address], [place], [place] [address]    Culture METHICILLIN RESISTANT STAPHYLOCOCCUS AUREUS (A)  Final   Report Status 02/16/2019 FINAL  Final   Organism ID, Bacteria METHICILLIN RESISTANT STAPHYLOCOCCUS AUREUS  Final      Susceptibility   Methicillin resistant staphylococcus aureus - MIC*    CIPROFLOXACIN >=8 RESISTANT Resistant     ERYTHROMYCIN >=8 RESISTANT Resistant     GENTAMICIN <=0.5 SENSITIVE Sensitive     OXACILLIN >=  4 RESISTANT Resistant     TETRACYCLINE <=1 SENSITIVE Sensitive     VANCOMYCIN <=0.5 SENSITIVE Sensitive     TRIMETH/SULFA <=10 SENSITIVE Sensitive     CLINDAMYCIN RESISTANT Resistant     RIFAMPIN <=0.5 SENSITIVE Sensitive     Inducible Clindamycin POSITIVE Resistant     * METHICILLIN RESISTANT STAPHYLOCOCCUS AUREUS  Blood Culture ID Panel (Reflexed)     Status: Abnormal   Collection Time: 02/13/19  2:18 PM  Result Value Ref Range Status   Enterococcus species NOT DETECTED NOT DETECTED Final   Listeria monocytogenes NOT  DETECTED NOT DETECTED Final   Staphylococcus species DETECTED (A) NOT DETECTED Final    Comment: CRITICAL RESULT CALLED TO, READ BACK BY AND VERIFIED WITH: Herbert Pun AT [id number] ON 02/14/2019 [place].    Staphylococcus aureus (BCID) DETECTED (A) NOT DETECTED Final    Comment: Methicillin (oxacillin)-resistant Staphylococcus aureus (MRSA). MRSA is predictably resistant to beta-lactam antibiotics (except ceftaroline). Preferred therapy is vancomycin unless clinically contraindicated. Patient requires contact precautions if  hospitalized. CRITICAL RESULT CALLED TO, READ BACK BY AND VERIFIED WITH: Herbert Pun AT [id number] ON 02/14/2019 [place].    Methicillin resistance DETECTED (A) NOT DETECTED Final    Comment: CRITICAL RESULT CALLED TO, READ BACK BY AND VERIFIED WITH: Herbert Pun AT [id number] ON 02/14/2019 [place].    Streptococcus species NOT DETECTED NOT DETECTED Final   Streptococcus agalactiae NOT DETECTED NOT DETECTED Final   Streptococcus pneumoniae NOT DETECTED NOT DETECTED Final   Streptococcus pyogenes NOT DETECTED NOT DETECTED Final   Acinetobacter baumannii NOT DETECTED NOT DETECTED Final   Enterobacteriaceae species NOT DETECTED NOT DETECTED Final   Enterobacter cloacae complex NOT DETECTED NOT DETECTED Final   Escherichia coli NOT DETECTED NOT DETECTED Final   Klebsiella oxytoca NOT DETECTED NOT DETECTED Final   Klebsiella pneumoniae NOT DETECTED NOT DETECTED Final   Proteus species NOT DETECTED NOT DETECTED Final   Serratia marcescens NOT DETECTED NOT DETECTED Final   Haemophilus influenzae NOT DETECTED NOT DETECTED Final   Neisseria meningitidis NOT DETECTED NOT DETECTED Final   Pseudomonas aeruginosa NOT DETECTED NOT DETECTED Final   Candida albicans NOT DETECTED NOT DETECTED Final   Candida glabrata NOT DETECTED NOT DETECTED Final   Candida krusei NOT DETECTED NOT DETECTED Final   Candida parapsilosis NOT DETECTED NOT DETECTED Final   Candida tropicalis NOT DETECTED NOT DETECTED  Final    Comment: Performed at [facility], [address] [place], [place] [address]  CULTURE, BLOOD (ROUTINE X 2) w Reflex to ID Panel     Status: None   Collection Time: 02/13/19  3:09 PM   Specimen: BLOOD  Result Value Ref Range Status   Specimen Description BLOOD BLOOD LEFT HAND  Final   Special Requests   Final    BOTTLES DRAWN AEROBIC AND ANAEROBIC Blood Culture adequate volume   Culture   Final    NO GROWTH 5 DAYS Performed at [facility], [address], [place], [place] [address]    Report Status 02/18/2019 FINAL  Final  Culture, blood (routine x 2)     Status: Abnormal   Collection Time: 02/15/19 12:49 PM   Specimen: BLOOD  Result Value Ref Range Status   Specimen Description   Final    BLOOD LT HAND Performed at [facility], [address] [place],  [address]    Special Requests   Final    BOTTLES DRAWN AEROBIC AND ANAEROBIC Blood Culture results may not be optimal due  to an inadequate volume of blood received in culture bottles Performed at [facility], [address] [place], [place] [address]    Culture  Setup Time   Final    GRAM POSITIVE COCCI IN BOTH AEROBIC AND ANAEROBIC BOTTLES CRITICAL RESULT CALLED TO, READ BACK BY AND VERIFIED WITH: Eleonore Chiquito AT [id number] 02/16/2019 SDR Performed at [facility], [address] [place], [place] [address]    Culture (A)  Final    METHICILLIN RESISTANT STAPHYLOCOCCUS AUREUS STAPHYLOCOCCUS SPECIES (COAGULASE NEGATIVE) THE SIGNIFICANCE OF ISOLATING THIS ORGANISM FROM A SINGLE SET OF BLOOD CULTURES WHEN MULTIPLE SETS ARE DRAWN IS UNCERTAIN. PLEASE NOTIFY THE MICROBIOLOGY DEPARTMENT WITHIN ONE WEEK IF SPECIATION AND SENSITIVITIES ARE REQUIRED. Performed at [facility], [place] [address] [place], [place] [address]    Report Status 02/18/2019 FINAL  Final   Organism ID, Bacteria METHICILLIN RESISTANT STAPHYLOCOCCUS AUREUS  Final      Susceptibility    Methicillin resistant staphylococcus aureus - MIC*    CIPROFLOXACIN >=8 RESISTANT Resistant     ERYTHROMYCIN >=8 RESISTANT Resistant     GENTAMICIN <=0.5 SENSITIVE Sensitive     OXACILLIN >=4 RESISTANT Resistant     TETRACYCLINE <=1 SENSITIVE Sensitive     VANCOMYCIN <=0.5 SENSITIVE Sensitive     TRIMETH/SULFA <=10 SENSITIVE Sensitive     CLINDAMYCIN RESISTANT Resistant     RIFAMPIN <=0.5 SENSITIVE Sensitive     Inducible Clindamycin POSITIVE Resistant     * METHICILLIN RESISTANT STAPHYLOCOCCUS AUREUS  Aerobic Culture (superficial specimen)     Status: None   Collection Time: 02/17/19  1:31 PM   Specimen: Heel; Wound  Result Value Ref Range Status   Specimen Description   Final    HEEL Performed at [facility], [address], [place], [place] [address]    Special Requests   Final    NONE Performed at [facility], [address] [place], [place] [address]    Gram Stain   Final    NO WBC SEEN MODERATE GRAM POSITIVE COCCI IN PAIRS    Culture   Final    ABUNDANT METHICILLIN RESISTANT STAPHYLOCOCCUS AUREUS ABUNDANT DIPHTHEROIDS(CORYNEBACTERIUM SPECIES) Standardized susceptibility testing for this organism is not available. Performed at [facility], [place] [address] [place], [place] [address]    Report Status 02/19/2019 FINAL  Final   Organism ID, Bacteria METHICILLIN RESISTANT STAPHYLOCOCCUS AUREUS  Final      Susceptibility   Methicillin resistant staphylococcus aureus - MIC*    CIPROFLOXACIN >=8 RESISTANT Resistant     ERYTHROMYCIN >=8 RESISTANT Resistant     GENTAMICIN <=0.5 SENSITIVE Sensitive     OXACILLIN >=4 RESISTANT Resistant     TETRACYCLINE <=1 SENSITIVE Sensitive     VANCOMYCIN 1 SENSITIVE Sensitive     TRIMETH/SULFA <=10 SENSITIVE Sensitive     CLINDAMYCIN RESISTANT Resistant     RIFAMPIN <=0.5 SENSITIVE Sensitive     Inducible Clindamycin POSITIVE Resistant     * ABUNDANT METHICILLIN RESISTANT STAPHYLOCOCCUS AUREUS  CULTURE,  BLOOD (ROUTINE X 2) w Reflex to ID Panel     Status: None   Collection Time: 02/18/19 11:29 PM   Specimen: BLOOD  Result Value Ref Range Status   Specimen Description BLOOD LEFT ARM  Final   Special Requests   Final    BOTTLES DRAWN AEROBIC AND ANAEROBIC Blood Culture adequate volume   Culture   Final    NO GROWTH 5 DAYS Performed at [facility], [address] [place], [place] [address]    Report Status 02/23/2019 FINAL  Final  CULTURE, BLOOD (ROUTINE X 2) w Reflex to ID Panel     Status: None   Collection Time: 02/18/19 11:29 PM   Specimen: BLOOD  Result Value Ref Range Status   Specimen Description BLOOD LEFT ARM  Final   Special Requests   Final    BOTTLES DRAWN AEROBIC AND ANAEROBIC Blood Culture adequate volume   Culture   Final    NO GROWTH 5 DAYS Performed at [facility], [address] [place], [place] [address]    Report Status 02/23/2019 FINAL  Final  Aerobic/Anaerobic Culture (surgical/deep wound)     Status: None   Collection Time: 03/04/19 11:04 AM   Specimen: PATH Other; Tissue  Result Value Ref Range Status   Specimen Description   Final    ARTERY Performed at [facility], [address] [place], [place] [address]    Special Requests   Final    RIGHT AV GRAFT AND STENTS CULTURE Performed at [facility], [address] [place], [place] [address]    Gram Stain   Final    RARE WBC PRESENT, PREDOMINANTLY PMN NO ORGANISMS SEEN    Culture   Final    No growth aerobically or anaerobically. Performed at [facility], [place] [address] [place], [place] [address]    Report Status 03/09/2019 FINAL  Final  Aerobic/Anaerobic Culture (surgical/deep wound)     Status: None   Collection Time: 03/04/19 11:29 AM   Specimen: PATH Other; Tissue  Result Value Ref Range Status   Specimen Description   Final    ARTERY Performed at [facility], [address], [place], [place] [address]    Special Requests    Final    FLUID AROUND AV GRAFT Performed at [facility], [address] [place], [place] [address]    Gram Stain   Final    FEW WBC PRESENT, PREDOMINANTLY PMN NO ORGANISMS SEEN    Culture   Final    No growth aerobically or anaerobically. Performed at [facility], [place] [address] [place],  [address]    Report Status 03/09/2019 FINAL  Final    Coagulation Studies: Recent Labs    03/09/19 0528  LABPROT 14.1  INR 1.1    Urinalysis: No results for input(s): COLORURINE, LABSPEC, PHURINE, GLUCOSEU, HGBUR, BILIRUBINUR, KETONESUR, PROTEINUR, UROBILINOGEN, NITRITE, LEUKOCYTESUR in the last 168 hours.  Invalid input(s): APPERANCEUR  Lipid Panel:     Component Value Date/Time   CHOL 153 02/11/2018 1404   TRIG 294 (H) 02/11/2018 1404   HDL 43 02/11/2018 1404   CHOLHDL 3.6 02/11/2018 1404   CHOLHDL 3.4 11/07/2017 0950   VLDL 22 11/07/2017 0950   LDLCALC 51 02/11/2018 1404    HgbA1C:  Lab Results  Component Value Date   HGBA1C 5.3 02/09/2019    Urine Drug Screen:  No results found for: LABOPIA, COCAINSCRNUR, LABBENZ, AMPHETMU, THCU, LABBARB  Alcohol Level: No results for input(s): ETH in the last 168 hours.  Imaging: No results found.   Assessment/Plan:   64 y.o. female medical history significant ofESRD on HDMWF,anemia, chronic diastolic congestive heart failure, CAD status post PCI, type 2 diabetes, hypertension, hyperlipidemia, hypothyroidism who has been admitted for about 1 month presenting via EMS after she was found to be unresponsive.  Patient had prior admission with Covid PNA. Since admission she has not woken up. Patient is  here with prolonged hospitalization of MRSA bacteremia and is on vancomycin . MRI brain no acute abnormalities to explain current findings.    - Complicated story with poor baseline along with hx of covid complication - She has no clear focality on examination but is encephalopathic.  - no clear fevers or WBC  count - This is likely progressive delirium along with encephalopathy given the infection and recent covid infection as multiple cases with progressive encephalopathy in the setting of covid alone.  - Will get EEG  - If family wants aggressive care would potentially do LP look at cells and cell count but not sure paraneoplastic panel will help - Agree with palliative discussions.    03/10/2019, 11:43 AM

## 2019-03-10 NOTE — Progress Notes (Signed)
Nutrition Follow-up  DOCUMENTATION CODES:   Obesity unspecified  INTERVENTION:   Continue Osmolite 1.5 '@50ml'$ /hr + prostat liquid protein 30 ml daily via tube, supplement provides 100 kcal, 15 grams protein.  Free water flushes 30m q4 hours to maintain tube patency   Regimen provides 1900kcal/day, 90g/day protein, 10969mday free water.   B-complex with C daily via tube  Metamucil 1 packet BID via tube   NUTRITION DIAGNOSIS:   Increased nutrient needs related to chronic illness(ESRD on HD) as evidenced by estimated needs. Ongoing.  GOAL:   Patient will meet greater than or equal to 90% of their needs -met with tube feeds   MONITOR:   Labs, Weight trends, TF tolerance, Skin, I & O's  ASSESSMENT:   [age].o. female with end-stage renal disease on HD, atrial fibrillation, chronic diastolic congestive heart failure, CAD status post PCI, CABG,  type 2 diabetes, hypertension, hyperlipidemia, hypothyroidism, Covid positive in July 2020 admitted with AMS and found to have MRSA positive blood cultures   Pt s/p PEG placement 12/29  Pt tolerating tube feeds well at goal rate via PEG. Pt is having diarrhea via her rectal tube; will add metamucil twice daily. Pt continues to be somnolent and confused. Per chart, pt's weight appears to be stabilizing with addition of tube feeds; however pt has not been weighed since 1/1. RD will order weekly weights.   Medications reviewed and include: aspirin, B complex with C, epogen, heparin, insulin, synthroid, protonix, zinc, heparin, vancomycin   Labs reviewed: Na 134(L), K 3.8 wnl, BUN 39(H), creat 3.29(H), Mg 2.2 wnl- 1/4 P 3.5 wnl- 1/1 Hgb 7.4(L), Hct 23.9(L) cbgs- 271, 269, 211 x 24 hrs  Diet Order:   Diet Order            Diet NPO time specified  Diet effective now             EDUCATION NEEDS:   Not appropriate for education at this time  Skin:  Skin Assessment: Skin Integrity Issues:(stg II right buttocks; DTI right heel;  unstageable left heel)  Last BM:  1/4- type 7  Height:   Ht Readings from Last 1 Encounters:  03/04/19 5' 4.02" (1.626 m)   Weight:   Wt Readings from Last 1 Encounters:  03/06/19 83 kg   Ideal Body Weight:  45 kg  BMI:  Body mass index is 31.39 kg/m.  Estimated Nutritional Needs:   Kcal:  1700-1900kcal/day  Protein:  85-95g/day  Fluid:  UOP +1L  CaKoleen DistanceS, RD, LDN Pager #- [phone]#- [phone] Hours Pager: [phone]

## 2019-03-10 NOTE — Procedures (Addendum)
Patient Name: Jamie Arias  MRN: [id number]  Epilepsy Attending: Lora Havens  Referring Physician/Provider: Dr Leotis Pain Date: 03/10/2019 Duration: 23.01 minutes  Patient history: 64 year old female with altered mental status.  EEG to evaluate for seizures.  Level of alertness: Lethargic  AEDs during EEG study: None  Technical aspects: This EEG study was done with scalp electrodes positioned according to the 10-20 International system of electrode placement. Electrical activity was acquired at a sampling rate of 500Hz  and reviewed with a high frequency filter of 70Hz  and a low frequency filter of 1Hz . EEG data were recorded continuously and digitally stored.   Description: EEG showed continuous generalized 3 to 5 Hz theta-delta slowing.  One episode of general twitching/movement was noted.  Concomitant EEG showed muscle artifact, no EEG change suggestive of seizure was seen.  Physiologic photic driving was not seen during photic stimulation.  Hyperventilation was not performed.  Abnormality  -Continuous slow, generalized  IMPRESSION: This study is suggestive of moderate to severe diffuse encephalopathy, nonspecific etiology.  One episode of jaw twitching was recorded as described above without EEG change and was therefore nonepileptic. No seizures or epileptiform discharges were seen throughout the recording.  Hedda Crumbley Barbra Sarks

## 2019-03-10 NOTE — Progress Notes (Signed)
IV team nurse Kasandra Knudsen stated patient wasn't good candidate for PICC line placement in either arm due to DVT in left arm and AVG removed in right arm. NP notified. Awaiting orders from attending MD as suggestions where IV placement could occur due to poor vasculature and limited access.

## 2019-03-10 NOTE — Progress Notes (Signed)
Still following this patients hospital stay for a safe discharge back to dialysis center. Please keep me updated on potential discharge. Please note that due to length of stay patient chair time could possibly change.   Elvera Bicker Dialysis Coordinator [phone]

## 2019-03-10 NOTE — Progress Notes (Signed)
[name], female   DOB: [birth year], 64 y.o.   MRN: [id number] [facility] PROGRESS NOTE  Jamie Arias DOB: [birth year] DOA: 02/09/2019 PCP: [name], MD  HPI/Subjective: Patient is minimally responsive.  Not following any commands but opening her eyes occasionally. Has frequent jaw shaking.  Objective: Vitals:   03/10/19 1252 03/10/19 1620  BP: (!) 99/52 128/67  Pulse: 82 87  Resp:    Temp: 99 F (37.2 C) 98.7 F (37.1 C)  SpO2: 100% 100%    Filed Weights   03/02/19 0930 03/06/19 1500 03/10/19 1220  Weight: 75.5 kg 83 kg 87.3 kg    ROS: Review of Systems  Unable to perform ROS: Acuity of condition  Constitutional: Negative for chills and fever.  Respiratory: Negative for cough.   Gastrointestinal: Negative for diarrhea and vomiting.   Exam: Physical Exam  Constitutional: She appears lethargic.  HENT:  Nose: No mucosal edema.  Eyes: Pupils are equal, round, and reactive to light. Conjunctivae and lids are normal.  Neck: Carotid bruit is not present.  Cardiovascular: Regular rhythm, S1 normal and S2 normal. Exam reveals no gallop.  No murmur heard. Respiratory: No respiratory distress. She has decreased breath sounds in the right lower field and the left lower field. She has no wheezes. She has no rhonchi. She has no rales.  GI: Soft. Bowel sounds are normal. There is no abdominal tenderness.  Musculoskeletal:     Right ankle: No swelling.     Left ankle: No swelling.  Lymphadenopathy:    She has no cervical adenopathy.  Neurological: She appears lethargic.  Did not answer any questions for me this morning  Skin: Skin is warm. Nails show no clubbing.  Left heel stage II decubitus ulcer with black eschar. Right heel stage II decubitus ulcer As per nursing staff stage I decubitus ulcer sacrum Bruising upper extremities Wound VAC over right arm  Psychiatric:  Moved her right leg on her own.  Unresponsive to sternal  rub for me.      Data Reviewed: Basic Metabolic Panel: Recent Labs  Lab 03/04/19 0417 03/05/19 0356 03/06/19 0345 03/07/19 0423 03/08/19 0700 03/09/19 0528  NA 138 137 134* 136 135 134*  K 4.2 3.6 3.9 3.7 3.7 3.8  CL 101 99 100 99 100 101  CO2 28 26 25 26 26 23   GLUCOSE 136* 102* 206* 191* 122* 141*  BUN 24* 14 23 19  30* 39*  CREATININE 3.14* 2.16* 2.63* 1.88* 2.70* 3.29*  CALCIUM 8.5* 8.5* 8.0* 8.2* 8.1* 8.4*  MG 2.1 1.7 2.1  --  2.2 2.2  PHOS 3.7 2.8 3.5  --   --   --    Liver Function Tests: Recent Labs  Lab 03/05/19 0356 03/09/19 0528  AST 25 18  ALT 16 7  ALKPHOS 88 68  BILITOT 0.8 0.9  PROT 5.6* 4.9*  ALBUMIN 1.9* 1.9*   CBC: Recent Labs  Lab 03/06/19 0345 03/07/19 0423 03/08/19 0700 03/09/19 0528 03/09/19 1515 03/10/19 0600  WBC 7.2 8.9 8.9 9.1  --  9.1  HGB 6.8* 7.5* 7.1* 7.4* 8.0* 7.4*  HCT 21.7* 22.1* 21.0* 23.7* 24.8* 23.9*  MCV 94.8 87.0 88.6 94.0  --  96.4  PLT 171 173 183 233  --  261   Cardiac Enzymes: No results for input(s): CKTOTAL, CKMB, CKMBINDEX, TROPONINI in the last 168 hours. BNP (last 3 results) Recent Labs    03/01/19 0429  BNP 720.0*     CBG:  Recent Labs  Lab 03/09/19 2315 03/10/19 0500 03/10/19 0742 03/10/19 1132 03/10/19 1616  GLUCAP 209* 271* 269* 211* 200*     Studies: EEG  Result Date: 03/10/2019 Lora Havens, MD     03/10/2019  2:54 PM Patient Name: Jamie Arias MRN: [id number] Epilepsy Attending: Lora Havens Referring Physician/Provider: Dr Leotis Pain Date: 03/10/2019 Duration: 23.01 minutes Patient history: 64 year old female with altered mental status.  EEG to evaluate for seizures. Level of alertness: Lethargic AEDs during EEG study: None Technical aspects: This EEG study was done with scalp electrodes positioned according to the 10-20 International system of electrode placement. Electrical activity was acquired at a sampling rate of 500Hz  and reviewed with a high frequency filter of 70Hz  and  a low frequency filter of 1Hz . EEG data were recorded continuously and digitally stored. Description: EEG showed continuous generalized 3 to 5 Hz theta-delta slowing.  One episode of general twitching/movement was noted.  Concomitant EEG showed muscle artifact, no EEG change suggestive of seizure was seen.  Physiologic photic driving was not seen during photic stimulation.  Hyperventilation was not performed. Abnormality -Continuous slow, generalized IMPRESSION: This study is suggestive of moderate to severe diffuse encephalopathy, nonspecific etiology.  One episode of jaw twitching was recorded as described above without EEG change and was therefore nonepileptic. No seizures or epileptiform discharges were seen throughout the recording. Priyanka Barbra Sarks    Scheduled Meds: . aspirin  81 mg Oral Daily  . B-complex with vitamin C  1 tablet Per Tube Daily  . Chlorhexidine Gluconate Cloth  6 each Topical Q0600  . epoetin (EPOGEN/PROCRIT) injection  4,000 Units Intravenous Q M,W,F-HD  . feeding supplement (PRO-STAT SUGAR FREE 64)  30 mL Per Tube Daily  . fluticasone  2 spray Each Nare Daily  . free water  20 mL Per Tube Q4H  . insulin aspart  0-15 Units Subcutaneous Q4H  . levothyroxine  88 mcg Oral QAC breakfast  . mouth rinse  15 mL Mouth Rinse BID  . midodrine  10 mg Oral TID WC  . pantoprazole (PROTONIX) IV  40 mg Intravenous Q12H  . psyllium  1 packet Per Tube BID  . venlafaxine  75 mg Oral BID WC  . zinc sulfate  220 mg Oral Daily   Continuous Infusions: . sodium chloride Stopped (03/01/19 0429)  . feeding supplement (OSMOLITE 1.5 CAL) 1,000 mL (03/10/19 0538)  . heparin 1,200 Units/hr (03/10/19 1700)  . vancomycin Stopped (03/09/19 1424)    Assessment/Plan:   1. Sepsis secondary to MRSA bacteremia.  Currently on vancomycin.   Appreciate ID consultation.  Will need 6-week long course of IV therapy.  Vascular removed her graft right arm on 03/04/2019 which could be the source of  infection. 2. Hypotension.  Patient taken off Levophed and transferred to the floor.  Patient on high-dose midodrine.  Blood pressure still on the softer side. 3. Acute blood loss anemia.  Hemoglobin up to 7.5 after transfusion.  Monitor H&H. 4. End-stage renal disease on hemodialysis.  Tunneled catheter placed by vascular.  Graft removal right arm on 03/04/2019 5. Acute metabolic encephalopathy.  Mental status still impaired but opens her eyes today to my voice.  MRI of the brain negative.  EEG negative.  Ammonia normal.  TSH normal.  Consulted neurology for further input.  Overall prognosis poor.  6. Left arm DVT.  Continue heparin.  Transition to oral anticoagulation soon 7. Paroxysmal atrial  fibrillation on amiodarone.  Continue heparin transition to oral  anticoagulation soon. 8. Chronic diastolic congestive heart failure.  Dialysis to manage fluid status. 9. Type 2 diabetes mellitus.  On sliding scale.  10. Hypothyroidism unspecified on Synthroid 11. Overall prognosis is poor. Palliative care following.  Patient a DNR. 12. Diarrhea.  Hold off on stool studies at this time.  13. Present on admission, stage II decubiti right heel, left heel and stage I decubiti on buttock 14. Nutrition: PEG placed 03/03/2019.  Continue tube feeding 15. Acute hypoxic respiratory failure.  Patient intubated for PEG procedure and extubated 03/05/2019.  As needed oxygen  Code Status:     Code Status Orders  (From admission, onward)         Start     Ordered   02/21/19 0930  Do not attempt resuscitation (DNR)  Continuous    Question Answer Comment  In the event of cardiac or respiratory ARREST Do not call a "code blue"   In the event of cardiac or respiratory ARREST Do not perform Intubation, CPR, defibrillation or ACLS   In the event of cardiac or respiratory ARREST Use medication by any route, position, wound care, and other measures to relive pain and suffering. May use oxygen, suction and manual  treatment of airway obstruction as needed for comfort.      02/21/19 0932        Code Status History    Date Active Date Inactive Code Status Order ID Comments User Context   02/09/2019 1755 02/21/2019 0932 Full Code [id number]  Thornell Mule, MD ED   08/24/2018 0022 09/15/2018 1720 Full Code [id number]  Shela Leff, MD Inpatient   09/04/2017 1510 09/06/2017 1920 Full Code [id number]  Epifanio Lesches, MD ED   02/15/2017 1243 02/16/2017 2023 Full Code [id number]  Saundra Shelling, MD Inpatient   10/23/2015 0828 10/23/2015 0908 Full Code [id number]  Saundra Shelling, MD Inpatient   09/03/2015 0130 09/03/2015 1619 Full Code [id number]  Lance Coon, MD Inpatient   07/02/2013 1327 07/14/2013 1540 Full Code [id number]  Nani Skillern, PA-C Inpatient   06/29/2013 2153 07/02/2013 1327 Full Code [id number]  Larey Dresser, MD Inpatient   Advance Care Planning Activity     Family Communication: Spoke with husband on the phone Disposition Plan: Transitional care team to look into LTAC facility No significant change in patient's condition.  Consultants:  Nephrology  Infectious disease  Vascular surgery  Neurology  Critical care specialist  Antibiotics:  Vancomycin  Time spent: 25 minutes  [place]

## 2019-03-10 NOTE — Progress Notes (Signed)
Daily Progress Note   Patient Name: Jamie Arias       Date: 03/10/2019 DOB: [birth year]  Age: 64 y.o. MRN#: [id number] Attending Physician: Lavina Hamman, MD Primary Care Physician: [name], MD Admit Date: 02/09/2019  Reason for Consultation/Follow-up: Establishing goals of care  Subjective: Patient resting in bed out of ICU. She does not open eyes to voice or touch. No family at bedside. Per notes, teams are continuing to work on possible causes of her encephalopathy.   Length of Stay: 29  Current Medications: Scheduled Meds:  . aspirin  81 mg Oral Daily  . B-complex with vitamin C  1 tablet Per Tube Daily  . Chlorhexidine Gluconate Cloth  6 each Topical Q0600  . epoetin (EPOGEN/PROCRIT) injection  4,000 Units Intravenous Q M,W,F-HD  . feeding supplement (PRO-STAT SUGAR FREE 64)  30 mL Per Tube Daily  . fluticasone  2 spray Each Nare Daily  . free water  20 mL Per Tube Q4H  . insulin aspart  0-15 Units Subcutaneous Q4H  . levothyroxine  88 mcg Oral QAC breakfast  . mouth rinse  15 mL Mouth Rinse BID  . midodrine  10 mg Oral TID WC  . pantoprazole (PROTONIX) IV  40 mg Intravenous Q12H  . venlafaxine  75 mg Oral BID WC  . zinc sulfate  220 mg Oral Daily    Continuous Infusions: . sodium chloride Stopped (03/01/19 0429)  . feeding supplement (OSMOLITE 1.5 CAL) 1,000 mL (03/10/19 0538)  . heparin 1,200 Units/hr (03/10/19 0902)  . vancomycin Stopped (03/09/19 1424)    PRN Meds: sodium chloride, ipratropium-albuterol, ondansetron (ZOFRAN) IV  Physical Exam Constitutional:      Comments: Eyes closed.   Pulmonary:     Effort: Pulmonary effort is normal.             Vital Signs: BP (!) 132/50 (BP Location: Right Leg)   Pulse 85   Temp 99.1 F (37.3 C) (Oral)    Resp 18   Ht 5' 4.02" (1.626 m)   Wt 83 kg   SpO2 100%   BMI 31.39 kg/m  SpO2: SpO2: 100 % O2 Device: O2 Device: Nasal Cannula O2 Flow Rate: O2 Flow Rate (L/min): 2 L/min  Intake/output summary:   Intake/Output Summary (Last 24 hours) at 03/10/2019 1023 Last data  filed at 03/10/2019 0902 Gross per 24 hour  Intake 793.07 ml  Output 1250 ml  Net -456.93 ml   LBM: Last BM Date: 03/09/19(rectal tube ) Baseline Weight: Weight: 59 kg Most recent weight: Weight: 83 kg       Palliative Assessment/Data:       Patient Active Problem List   Diagnosis Date Noted  . AF (paroxysmal atrial fibrillation) ([place])   . Diarrhea   . Oropharyngeal dysphagia   . Acute metabolic encephalopathy   . Acute blood loss anemia   . Arm DVT (deep venous thromboembolism), acute, left ([place]) 02/13/2019  . Acute encephalopathy 02/13/2019  . Decubitus ulcer of heel, bilateral, stage 2 ([place]) 02/10/2019  . Sepsis due to methicillin resistant Staphylococcus aureus (MRSA) ([place]) 02/10/2019  . MRSA bacteremia 02/10/2019  . Altered level of consciousness 02/09/2019  . Adjustment disorder with depressed mood 08/31/2018  . COVID-19 virus infection 08/24/2018  . Acute on chronic respiratory failure with hypoxia ([place]) 08/24/2018  . UTI (urinary tract infection) 08/24/2018  . Allergic rhinitis 09/19/2017  . Uremia of renal origin 09/04/2017  . Chronic neck pain 07/25/2017  . Hyperkalemia 02/15/2017  . Complication from renal dialysis device 12/01/2016  . ESRD (end stage renal disease) ([place]) 01/19/2016  . GI bleed 10/25/2015  . Hypertensive heart disease 10/24/2015  . Chronic diastolic CHF (congestive heart failure) ([place]) 10/24/2015  . Unstable angina ([place]) 10/23/2015  . Hypotension 09/02/2015  . Chronic systolic CHF (congestive heart failure) ([place]) 09/02/2015  . Depression 07/27/2015  . Calculus of gallbladder with chronic cholecystitis without obstruction   . Bilateral carotid bruits 11/30/2014  . Low  magnesium levels 08/10/2013  . Anemia 08/10/2013  . Constipation 08/10/2013  . Atrial fibrillation, chronic ([place]) 07/30/2013  . Coronary artery disease   . CAD (coronary artery disease) 07/02/2013  . Acute systolic heart failure ([place]) 06/30/2013  . NSTEMI (non-ST elevated myocardial infarction) ([place]) 06/29/2013  . Vertigo 08/25/2012  . Sleep disorder 05/23/2012  . Hypothyroid 04/27/2012  . HTN (hypertension) 04/27/2012  . HLD (hyperlipidemia) 04/27/2012  . Type 2 diabetes mellitus with ESRD (end-stage renal disease) ([place]) 04/27/2012  . Neuropathy 04/27/2012    Palliative Care Assessment & Plan    Recommendations/Plan:  DNR/DNI per husband at this time.  Code Status:    Code Status Orders  (From admission, onward)         Start     Ordered   02/21/19 0930  Do not attempt resuscitation (DNR)  Continuous    Question Answer Comment  In the event of cardiac or respiratory ARREST Do not call a "code blue"   In the event of cardiac or respiratory ARREST Do not perform Intubation, CPR, defibrillation or ACLS   In the event of cardiac or respiratory ARREST Use medication by any route, position, wound care, and other measures to relive pain and suffering. May use oxygen, suction and manual treatment of airway obstruction as needed for comfort.      02/21/19 0932        Code Status History    Date Active Date Inactive Code Status Order ID Comments User Context   02/09/2019 [id number] 02/21/2019 0932 Full Code [id number]  Thornell Mule, MD ED   08/24/2018 0022 09/15/2018 1720 Full Code [id number]  Shela Leff, MD Inpatient   09/04/2017 1510 09/06/2017 1920 Full Code [id number]  Epifanio Lesches, MD ED   02/15/2017 1243 02/16/2017 2023 Full Code [id number]  Saundra Shelling, MD Inpatient   10/23/2015 [id number] 10/23/2015 0908 Full  Code [id number]  Saundra Shelling, MD Inpatient   09/03/2015 0130 09/03/2015 1619 Full Code [id number]  Lance Coon, MD Inpatient   07/02/2013 1327 07/14/2013 1540 Full  Code [id number]  Arnoldo Lenis Inpatient   06/29/2013 2153 07/02/2013 1327 Full Code [id number]  Larey Dresser, MD Inpatient   Advance Care Planning Activity       Prognosis:  Poor overall   Thank you for allowing the Palliative Medicine Team to assist in the care of this patient.   Total Time 15 min Prolonged Time Billed  no      Greater than 50%  of this time was spent counseling and coordinating care related to the above assessment and plan.  Asencion Gowda, NP  Please contact Palliative Medicine Team phone at [phone] for questions and concerns.

## 2019-03-10 NOTE — Progress Notes (Signed)
eeg completed ° °

## 2019-03-10 NOTE — Progress Notes (Addendum)
Order noted for PICC line placement. DVT to L arm, AVG removed to R arm. Patient is also a renal patient with current [place] present. Patient is not a candidate for bedside PICC placement. Primary RN Acey Lav notified to contact MD to cancel PICC order.

## 2019-03-11 DIAGNOSIS — L0291 Cutaneous abscess, unspecified: Secondary | ICD-10-CM

## 2019-03-11 LAB — GLUCOSE, CAPILLARY
Glucose-Capillary: 137 mg/dL — ABNORMAL HIGH (ref 70–99)
Glucose-Capillary: 138 mg/dL — ABNORMAL HIGH (ref 70–99)
Glucose-Capillary: 128 mg/dL — ABNORMAL HIGH (ref 70–99)
Glucose-Capillary: 92 mg/dL (ref 70–99)
Glucose-Capillary: 104 mg/dL — ABNORMAL HIGH (ref 70–99)
Glucose-Capillary: 213 mg/dL — ABNORMAL HIGH (ref 70–99)

## 2019-03-11 LAB — COMPREHENSIVE METABOLIC PANEL
ALT: 7 U/L (ref 0–44)
AST: 17 U/L (ref 15–41)
Albumin: 1.8 g/dL — ABNORMAL LOW (ref 3.5–5.0)
Alkaline Phosphatase: 74 U/L (ref 38–126)
Anion gap: 6 (ref 5–15)
BUN: 33 mg/dL — ABNORMAL HIGH (ref 8–23)
CO2: 29 mmol/L (ref 22–32)
Calcium: 8.4 mg/dL — ABNORMAL LOW (ref 8.9–10.3)
Chloride: 102 mmol/L (ref 98–111)
Creatinine, Ser: 2.97 mg/dL — ABNORMAL HIGH (ref 0.44–1.00)
GFR calc Af Amer: 18 mL/min — ABNORMAL LOW (ref >60)
GFR calc non Af Amer: 16 mL/min — ABNORMAL LOW (ref >60)
Glucose, Bld: 151 mg/dL — ABNORMAL HIGH (ref 70–99)
Potassium: 3.4 mmol/L — ABNORMAL LOW (ref 3.5–5.1)
Sodium: 137 mmol/L (ref 135–145)
Total Bilirubin: 0.7 mg/dL (ref 0.3–1.2)
Total Protein: 5 g/dL — ABNORMAL LOW (ref 6.5–8.1)

## 2019-03-11 LAB — QUANTIFERON-TB GOLD PLUS (RQFGPL)
QuantiFERON Mitogen Value: 0.57 IU/mL
QuantiFERON Nil Value: 0.04 IU/mL
QuantiFERON TB1 Ag Value: 0.04 IU/mL
QuantiFERON TB2 Ag Value: 0.04 IU/mL

## 2019-03-11 LAB — CBC
HCT: 22.6 % — ABNORMAL LOW (ref 36.0–46.0)
Hemoglobin: 7.2 g/dL — ABNORMAL LOW (ref 12.0–15.0)
MCH: 30.9 pg (ref 26.0–34.0)
MCHC: 31.9 g/dL (ref 30.0–36.0)
MCV: 97 fL (ref 80.0–100.0)
Platelets: 245 10*3/uL (ref 150–400)
RBC: 2.33 MIL/uL — ABNORMAL LOW (ref 3.87–5.11)
RDW: 21.7 % — ABNORMAL HIGH (ref 11.5–15.5)
WBC: 9.8 10*3/uL (ref 4.0–10.5)
nRBC: 0.7 % — ABNORMAL HIGH (ref 0.0–0.2)

## 2019-03-11 LAB — VANCOMYCIN, RANDOM: Vancomycin Rm: 20

## 2019-03-11 LAB — MAGNESIUM: Magnesium: 2.1 mg/dL (ref 1.7–2.4)

## 2019-03-11 LAB — QUANTIFERON-TB GOLD PLUS: QuantiFERON-TB Gold Plus: NEGATIVE

## 2019-03-11 LAB — EPSTEIN BARR VRS(EBV DNA BY PCR)
EBV DNA QN by PCR: 203 copies/mL
log10 EBV DNA Qn PCR: 2.307 log10 copy/mL

## 2019-03-11 LAB — PROTIME-INR
INR: 1.2 (ref 0.8–1.2)
Prothrombin Time: 14.6 seconds (ref 11.4–15.2)

## 2019-03-11 LAB — HEPARIN LEVEL (UNFRACTIONATED): Heparin Unfractionated: 0.34 IU/mL (ref 0.30–0.70)

## 2019-03-11 MED ORDER — WARFARIN - PHARMACIST DOSING INPATIENT: Freq: Every day | Status: DC

## 2019-03-11 MED ORDER — WARFARIN SODIUM 5 MG PO TABS
5.0000 mg | ORAL_TABLET | Freq: Once | ORAL | Status: AC
  Administered 2019-03-11: 5 mg via ORAL
  Filled 2019-03-11: qty 1

## 2019-03-11 NOTE — Progress Notes (Signed)
[facility]  ROUNDING NOTE   Subjective:  Patient seen during dialysis treatment. Tolerating well. Resting comfortably.    Objective:  Vital signs in last 24 hours:  Temp:  [98.5 F (36.9 C)-99.4 F (37.4 C)] 99.4 F (37.4 C) (01/06 0818) Pulse Rate:  [82-87] 87 (01/06 0818) Resp:  [19-20] 19 (01/06 0818) BP: (99-143)/(52-75) 143/75 (01/06 0818) SpO2:  [96 %-100 %] 98 % (01/06 0818) Weight:  [87.3 kg] 87.3 kg (01/05 1220)  Weight change:  Filed Weights   03/02/19 0930 03/06/19 1500 03/10/19 1220  Weight: 75.5 kg 83 kg 87.3 kg    Intake/Output: I/O last 3 completed shifts: In: 2071.6 [I.V.:371.6; NG/GT:1700] Out: 600 [Stool:600]   Intake/Output this shift:  No intake/output data recorded.  Physical Exam: General: NAD, laying in bed  HEENT [place]/AT OM moist  Lungs:  Scatterd rhonchi, normal effort  Heart: Regular rate and rhythm  Abdomen:  Soft, nontender, PEG tube in place  Extremities: + peripheral edema b/l, feet in b/l soft support  Neurologic: Awake, alert  Access: Left IJ PC  Rectal rube in place   Basic Metabolic Panel: Recent Labs  Lab 03/05/19 0356 03/06/19 0345 03/07/19 0423 03/08/19 0700 03/09/19 0528 03/11/19 0502  NA 137 134* 136 135 134* 137  K 3.6 3.9 3.7 3.7 3.8 3.4*  CL 99 100 99 100 101 102  CO2 26 25 26 26 23 29   GLUCOSE 102* 206* 191* 122* 141* 151*  BUN 14 23 19  30* 39* 33*  CREATININE 2.16* 2.63* 1.88* 2.70* 3.29* 2.97*  CALCIUM 8.5* 8.0* 8.2* 8.1* 8.4* 8.4*  MG 1.7 2.1  --  2.2 2.2 2.1  PHOS 2.8 3.5  --   --   --   --     Liver Function Tests: Recent Labs  Lab 03/05/19 0356 03/09/19 0528 03/11/19 0502  AST 25 18 17   ALT 16 7 7   ALKPHOS 88 68 74  BILITOT 0.8 0.9 0.7  PROT 5.6* 4.9* 5.0*  ALBUMIN 1.9* 1.9* 1.8*   No results for input(s): LIPASE, AMYLASE in the last 168 hours. No results for input(s): AMMONIA in the last 168 hours.  CBC: Recent Labs  Lab 03/07/19 0423 03/08/19 0700 03/09/19 0528  03/09/19 1515 03/10/19 0600 03/11/19 0502  WBC 8.9 8.9 9.1  --  9.1 9.8  HGB 7.5* 7.1* 7.4* 8.0* 7.4* 7.2*  HCT 22.1* 21.0* 23.7* 24.8* 23.9* 22.6*  MCV 87.0 88.6 94.0  --  96.4 97.0  PLT 173 183 233  --  261 245    Cardiac Enzymes: No results for input(s): CKTOTAL, CKMB, CKMBINDEX, TROPONINI in the last 168 hours.  BNP: Invalid input(s): POCBNP  CBG: Recent Labs  Lab 03/10/19 1616 03/10/19 2042 03/11/19 0116 03/11/19 0359 03/11/19 0746  GLUCAP 200* 299* 137* 138* 128*    Microbiology: Results for orders placed or performed during the hospital encounter of 02/09/19  Culture, blood (routine x 2)     Status: Abnormal   Collection Time: 02/09/19  2:29 PM   Specimen: BLOOD LEFT ARM  Result Value Ref Range Status   Specimen Description   Final    BLOOD LEFT ARM Performed at [facility], [address] [place], [place] [address]    Special Requests   Final    BOTTLES DRAWN AEROBIC AND ANAEROBIC Blood Culture adequate volume Performed at [facility], [address] [place], [place] [address]    Culture  Setup Time   Final    GRAM POSITIVE  COCCI IN BOTH AEROBIC AND ANAEROBIC BOTTLES CRITICAL VALUE NOTED.  VALUE IS CONSISTENT WITH PREVIOUSLY REPORTED AND CALLED VALUE. Performed at [facility], [address] [place], [place] [address]    Culture (A)  Final    STAPHYLOCOCCUS AUREUS SUSCEPTIBILITIES PERFORMED ON PREVIOUS CULTURE WITHIN THE LAST 5 DAYS. Performed at [facility], [place] [address], [place], [place] [address]    Report Status 02/12/2019 FINAL  Final  Culture, blood (routine x 2)     Status: Abnormal   Collection Time: 02/09/19  2:38 PM   Specimen: BLOOD LEFT ARM  Result Value Ref Range Status   Specimen Description   Final    BLOOD LEFT ARM Performed at [facility], [address] [place], [place] [address]    Special Requests   Final    BOTTLES DRAWN AEROBIC AND ANAEROBIC Blood Culture adequate  volume Performed at [facility], [address] [place], [place] [address]    Culture  Setup Time   Final    GRAM POSITIVE COCCI IN BOTH AEROBIC AND ANAEROBIC BOTTLES CRITICAL RESULT CALLED TO, READ BACK BY AND VERIFIED WITH: Franklin [id number] 02/10/2019 HNM Performed at [facility], [place] [address] [place], [place] [address]    Culture METHICILLIN RESISTANT STAPHYLOCOCCUS AUREUS (A)  Final   Report Status 02/12/2019 FINAL  Final   Organism ID, Bacteria METHICILLIN RESISTANT STAPHYLOCOCCUS AUREUS  Final      Susceptibility   Methicillin resistant staphylococcus aureus - MIC*    CIPROFLOXACIN >=8 RESISTANT Resistant     ERYTHROMYCIN >=8 RESISTANT Resistant     GENTAMICIN <=0.5 SENSITIVE Sensitive     OXACILLIN >=4 RESISTANT Resistant     TETRACYCLINE <=1 SENSITIVE Sensitive     VANCOMYCIN 1 SENSITIVE Sensitive     TRIMETH/SULFA <=10 SENSITIVE Sensitive     CLINDAMYCIN RESISTANT Resistant     RIFAMPIN <=0.5 SENSITIVE Sensitive     Inducible Clindamycin POSITIVE Resistant     * METHICILLIN RESISTANT STAPHYLOCOCCUS AUREUS  Blood Culture ID Panel (Reflexed)     Status: Abnormal   Collection Time: 02/09/19  2:38 PM  Result Value Ref Range Status   Enterococcus species NOT DETECTED NOT DETECTED Final   Listeria monocytogenes NOT DETECTED NOT DETECTED Final   Staphylococcus species DETECTED (A) NOT DETECTED Final    Comment: CRITICAL RESULT CALLED TO, READ BACK BY AND VERIFIED WITH: Hart Robinsons PHARMD [id number] 02/10/2019 HNM    Staphylococcus aureus (BCID) DETECTED (A) NOT DETECTED Final    Comment: Methicillin (oxacillin)-resistant Staphylococcus aureus (MRSA). MRSA is predictably resistant to beta-lactam antibiotics (except ceftaroline). Preferred therapy is vancomycin unless clinically contraindicated. Patient requires contact precautions if  hospitalized. CRITICAL RESULT CALLED TO, READ BACK BY AND VERIFIED WITH: Hart Robinsons PHARMD [id number] 02/10/2019 HNM    Methicillin  resistance DETECTED (A) NOT DETECTED Final    Comment: CRITICAL RESULT CALLED TO, READ BACK BY AND VERIFIED WITH: Hart Robinsons PHARMD [id number] 02/10/2019 HNM    Streptococcus species NOT DETECTED NOT DETECTED Final   Streptococcus agalactiae NOT DETECTED NOT DETECTED Final   Streptococcus pneumoniae NOT DETECTED NOT DETECTED Final   Streptococcus pyogenes NOT DETECTED NOT DETECTED Final   Acinetobacter baumannii NOT DETECTED NOT DETECTED Final   Enterobacteriaceae species NOT DETECTED NOT DETECTED Final   Enterobacter cloacae complex NOT DETECTED NOT DETECTED Final   Escherichia coli NOT DETECTED NOT DETECTED Final   Klebsiella oxytoca NOT DETECTED NOT DETECTED Final   Klebsiella pneumoniae NOT DETECTED  NOT DETECTED Final   Proteus species NOT DETECTED NOT DETECTED Final   Serratia marcescens NOT DETECTED NOT DETECTED Final   Haemophilus influenzae NOT DETECTED NOT DETECTED Final   Neisseria meningitidis NOT DETECTED NOT DETECTED Final   Pseudomonas aeruginosa NOT DETECTED NOT DETECTED Final   Candida albicans NOT DETECTED NOT DETECTED Final   Candida glabrata NOT DETECTED NOT DETECTED Final   Candida krusei NOT DETECTED NOT DETECTED Final   Candida parapsilosis NOT DETECTED NOT DETECTED Final   Candida tropicalis NOT DETECTED NOT DETECTED Final    Comment: Performed at [facility], [place], [place] [address]  SARS CORONAVIRUS 2 (TAT 6-24 HRS) Nasopharyngeal Nasopharyngeal Swab     Status: None   Collection Time: 02/09/19  2:54 PM   Specimen: Nasopharyngeal Swab  Result Value Ref Range Status   SARS Coronavirus 2 NEGATIVE NEGATIVE Final    Comment: (NOTE) SARS-CoV-2 target nucleic acids are NOT DETECTED. The SARS-CoV-2 RNA is generally detectable in upper and lower respiratory specimens during the acute phase of infection. Negative results do not preclude SARS-CoV-2 infection, do not rule out co-infections with other pathogens, and should not be used as  the sole basis for treatment or other patient management decisions. Negative results must be combined with clinical observations, patient history, and epidemiological information. The expected result is Negative. Fact Sheet for Patients: SugarRoll.be Fact Sheet for Healthcare Providers: https://www.woods-mathews.com/ This test is not yet approved or cleared by the [place] FDA and  has been authorized for detection and/or diagnosis of SARS-CoV-2 by FDA under an Emergency Use Authorization (EUA). This EUA will remain  in effect (meaning this test can be used) for the duration of the COVID-19 declaration under Section 56 4(b)(1) of the Act, 21 U.S.C. section 360bbb-3(b)(1), unless the authorization is terminated or revoked sooner. Performed at [facility], [place] [address] [place], [place] [address]   MRSA PCR Screening     Status: Abnormal   Collection Time: 02/10/19  5:03 PM   Specimen: Nasopharyngeal  Result Value Ref Range Status   MRSA by PCR POSITIVE (A) NEGATIVE Final    Comment:        The GeneXpert MRSA Assay (FDA approved for NASAL specimens only), is one component of a comprehensive MRSA colonization surveillance program. It is not intended to diagnose MRSA infection nor to guide or monitor treatment for MRSA infections. RESULT CALLED TO, READ BACK BY AND VERIFIED WITH: ALEKSEY FRASER @1839  02/10/19 MJU Performed at [facility], [address] [place], [place] [address]   Culture, blood (routine x 2)     Status: Abnormal   Collection Time: 02/11/19 12:31 PM   Specimen: BLOOD  Result Value Ref Range Status   Specimen Description   Final    BLOOD PORTA CATH Performed at [place] [address] [place], [place] [address]    Special Requests   Final    BOTTLES DRAWN AEROBIC AND ANAEROBIC Blood Culture adequate volume Performed at [facility], [address] [place], [place]  [address]    Culture  Setup Time   Final    GRAM POSITIVE COCCI ANAEROBIC BOTTLE ONLY CRITICAL RESULT CALLED TO, READ BACK BY AND VERIFIED WITH: SCOTT HALL AT [id number] 02/12/2019 [facility]  Performed at [facility], [address] [place],  [address]    Culture (A)  Final    STAPHYLOCOCCUS AUREUS SUSCEPTIBILITIES PERFORMED ON PREVIOUS CULTURE WITHIN THE LAST 5 DAYS. Performed at  [facility], [place] [address], [place], [place] [address]    Report Status 02/13/2019 FINAL  Final  Culture, blood (routine x 2)     Status: None   Collection Time: 02/11/19  3:57 PM   Specimen: BLOOD  Result Value Ref Range Status   Specimen Description BLOOD PORTA CATH  Final   Special Requests   Final    BOTTLES DRAWN AEROBIC AND ANAEROBIC Blood Culture adequate volume   Culture   Final    NO GROWTH 5 DAYS Performed at [facility], [address] [place], [place] [address]    Report Status 02/16/2019 FINAL  Final  CULTURE, BLOOD (ROUTINE X 2) w Reflex to ID Panel     Status: Abnormal   Collection Time: 02/13/19  2:18 PM   Specimen: BLOOD LEFT HAND  Result Value Ref Range Status   Specimen Description BLOOD LEFT HAND  Final   Special Requests   Final    BOTTLES DRAWN AEROBIC AND ANAEROBIC Blood Culture results may not be optimal due to an inadequate volume of blood received in culture bottles   Culture  Setup Time   Final    AEROBIC BOTTLE ONLY GRAM POSITIVE COCCI IN CLUSTERS CRITICAL RESULT CALLED TO, READ BACK BY AND VERIFIED WITH: Herbert Pun AT [id number] ON 02/14/2019 [place]. Performed at [facility], [place] [address] [place], [place] [address]    Culture METHICILLIN RESISTANT STAPHYLOCOCCUS AUREUS (A)  Final   Report Status 02/16/2019 FINAL  Final   Organism ID, Bacteria METHICILLIN RESISTANT STAPHYLOCOCCUS AUREUS  Final      Susceptibility   Methicillin resistant staphylococcus aureus - MIC*    CIPROFLOXACIN >=8 RESISTANT Resistant     ERYTHROMYCIN >=8 RESISTANT  Resistant     GENTAMICIN <=0.5 SENSITIVE Sensitive     OXACILLIN >=4 RESISTANT Resistant     TETRACYCLINE <=1 SENSITIVE Sensitive     VANCOMYCIN <=0.5 SENSITIVE Sensitive     TRIMETH/SULFA <=10 SENSITIVE Sensitive     CLINDAMYCIN RESISTANT Resistant     RIFAMPIN <=0.5 SENSITIVE Sensitive     Inducible Clindamycin POSITIVE Resistant     * METHICILLIN RESISTANT STAPHYLOCOCCUS AUREUS  Blood Culture ID Panel (Reflexed)     Status: Abnormal   Collection Time: 02/13/19  2:18 PM  Result Value Ref Range Status   Enterococcus species NOT DETECTED NOT DETECTED Final   Listeria monocytogenes NOT DETECTED NOT DETECTED Final   Staphylococcus species DETECTED (A) NOT DETECTED Final    Comment: CRITICAL RESULT CALLED TO, READ BACK BY AND VERIFIED WITH: ABBEY ELLINGTON AT [id number] ON 02/14/2019 [place].    Staphylococcus aureus (BCID) DETECTED (A) NOT DETECTED Final    Comment: Methicillin (oxacillin)-resistant Staphylococcus aureus (MRSA). MRSA is predictably resistant to beta-lactam antibiotics (except ceftaroline). Preferred therapy is vancomycin unless clinically contraindicated. Patient requires contact precautions if  hospitalized. CRITICAL RESULT CALLED TO, READ BACK BY AND VERIFIED WITH: Herbert Pun AT [id number] ON 02/14/2019 [place].    Methicillin resistance DETECTED (A) NOT DETECTED Final    Comment: CRITICAL RESULT CALLED TO, READ BACK BY AND VERIFIED WITH: Herbert Pun AT [id number] ON 02/14/2019 [place].    Streptococcus species NOT DETECTED NOT DETECTED Final   Streptococcus agalactiae NOT DETECTED NOT DETECTED Final   Streptococcus pneumoniae NOT DETECTED NOT DETECTED Final   Streptococcus pyogenes NOT DETECTED NOT DETECTED Final   Acinetobacter baumannii NOT DETECTED NOT DETECTED Final   Enterobacteriaceae species NOT DETECTED NOT DETECTED Final   Enterobacter cloacae complex NOT DETECTED  NOT DETECTED Final   Escherichia coli NOT DETECTED NOT DETECTED Final   Klebsiella oxytoca NOT DETECTED NOT  DETECTED Final   Klebsiella pneumoniae NOT DETECTED NOT DETECTED Final   Proteus species NOT DETECTED NOT DETECTED Final   Serratia marcescens NOT DETECTED NOT DETECTED Final   Haemophilus influenzae NOT DETECTED NOT DETECTED Final   Neisseria meningitidis NOT DETECTED NOT DETECTED Final   Pseudomonas aeruginosa NOT DETECTED NOT DETECTED Final   Candida albicans NOT DETECTED NOT DETECTED Final   Candida glabrata NOT DETECTED NOT DETECTED Final   Candida krusei NOT DETECTED NOT DETECTED Final   Candida parapsilosis NOT DETECTED NOT DETECTED Final   Candida tropicalis NOT DETECTED NOT DETECTED Final    Comment: Performed at [facility], [address] [place], [place] [address]  CULTURE, BLOOD (ROUTINE X 2) w Reflex to ID Panel     Status: None   Collection Time: 02/13/19  3:09 PM   Specimen: BLOOD  Result Value Ref Range Status   Specimen Description BLOOD BLOOD LEFT HAND  Final   Special Requests   Final    BOTTLES DRAWN AEROBIC AND ANAEROBIC Blood Culture adequate volume   Culture   Final    NO GROWTH 5 DAYS Performed at [facility], [address], [place], [place] [address]    Report Status 02/18/2019 FINAL  Final  Culture, blood (routine x 2)     Status: Abnormal   Collection Time: 02/15/19 12:49 PM   Specimen: BLOOD  Result Value Ref Range Status   Specimen Description   Final    BLOOD LT HAND Performed at [facility], [address] [place], [place] [address]    Special Requests   Final    BOTTLES DRAWN AEROBIC AND ANAEROBIC Blood Culture results may not be optimal due to an inadequate volume of blood received in culture bottles Performed at [facility], [address] [place], [place] [address]    Culture  Setup Time   Final    GRAM POSITIVE COCCI IN BOTH AEROBIC AND ANAEROBIC BOTTLES CRITICAL RESULT CALLED TO, READ BACK BY AND VERIFIED WITHEleonore Chiquito AT [id number] 02/16/2019 SDR Performed at [facility], [address] [place], [place] [address]    Culture (A)  Final    METHICILLIN RESISTANT STAPHYLOCOCCUS AUREUS STAPHYLOCOCCUS SPECIES (COAGULASE NEGATIVE) THE SIGNIFICANCE OF ISOLATING THIS ORGANISM FROM A SINGLE SET OF BLOOD CULTURES WHEN MULTIPLE SETS ARE DRAWN IS UNCERTAIN. PLEASE NOTIFY THE MICROBIOLOGY DEPARTMENT WITHIN ONE WEEK IF SPECIATION AND SENSITIVITIES ARE REQUIRED. Performed at [facility], [place] [address] [place], [place] [address]    Report Status 02/18/2019 FINAL  Final   Organism ID, Bacteria METHICILLIN RESISTANT STAPHYLOCOCCUS AUREUS  Final      Susceptibility   Methicillin resistant staphylococcus aureus - MIC*    CIPROFLOXACIN >=8 RESISTANT Resistant     ERYTHROMYCIN >=8 RESISTANT Resistant     GENTAMICIN <=0.5 SENSITIVE Sensitive     OXACILLIN >=4 RESISTANT Resistant     TETRACYCLINE <=1 SENSITIVE Sensitive     VANCOMYCIN <=0.5 SENSITIVE Sensitive     TRIMETH/SULFA <=10 SENSITIVE Sensitive     CLINDAMYCIN RESISTANT Resistant     RIFAMPIN <=0.5 SENSITIVE Sensitive     Inducible Clindamycin POSITIVE Resistant     * METHICILLIN RESISTANT STAPHYLOCOCCUS AUREUS  Aerobic Culture (superficial specimen)     Status: None   Collection Time: 02/17/19  1:31 PM   Specimen: Heel; Wound  Result Value Ref  Range Status   Specimen Description   Final    HEEL Performed at [facility], [address], [place], [place] [address]    Special Requests   Final    NONE Performed at [facility], [place], [place] [address]    Gram Stain   Final    NO WBC SEEN MODERATE GRAM POSITIVE COCCI IN PAIRS    Culture   Final    ABUNDANT METHICILLIN RESISTANT STAPHYLOCOCCUS AUREUS ABUNDANT DIPHTHEROIDS(CORYNEBACTERIUM SPECIES) Standardized susceptibility testing for this organism is not available. Performed at [facility], [place] [address], [place], [place] [address]    Report Status 02/19/2019 FINAL  Final   Organism ID, Bacteria  METHICILLIN RESISTANT STAPHYLOCOCCUS AUREUS  Final      Susceptibility   Methicillin resistant staphylococcus aureus - MIC*    CIPROFLOXACIN >=8 RESISTANT Resistant     ERYTHROMYCIN >=8 RESISTANT Resistant     GENTAMICIN <=0.5 SENSITIVE Sensitive     OXACILLIN >=4 RESISTANT Resistant     TETRACYCLINE <=1 SENSITIVE Sensitive     VANCOMYCIN 1 SENSITIVE Sensitive     TRIMETH/SULFA <=10 SENSITIVE Sensitive     CLINDAMYCIN RESISTANT Resistant     RIFAMPIN <=0.5 SENSITIVE Sensitive     Inducible Clindamycin POSITIVE Resistant     * ABUNDANT METHICILLIN RESISTANT STAPHYLOCOCCUS AUREUS  CULTURE, BLOOD (ROUTINE X 2) w Reflex to ID Panel     Status: None   Collection Time: 02/18/19 11:29 PM   Specimen: BLOOD  Result Value Ref Range Status   Specimen Description BLOOD LEFT ARM  Final   Special Requests   Final    BOTTLES DRAWN AEROBIC AND ANAEROBIC Blood Culture adequate volume   Culture   Final    NO GROWTH 5 DAYS Performed at [facility], [address] [place], [place] [address]    Report Status 02/23/2019 FINAL  Final  CULTURE, BLOOD (ROUTINE X 2) w Reflex to ID Panel     Status: None   Collection Time: 02/18/19 11:29 PM   Specimen: BLOOD  Result Value Ref Range Status   Specimen Description BLOOD LEFT ARM  Final   Special Requests   Final    BOTTLES DRAWN AEROBIC AND ANAEROBIC Blood Culture adequate volume   Culture   Final    NO GROWTH 5 DAYS Performed at [facility], [address], [place], [place] [address]    Report Status 02/23/2019 FINAL  Final  Aerobic/Anaerobic Culture (surgical/deep wound)     Status: None   Collection Time: 03/04/19 11:04 AM   Specimen: PATH Other; Tissue  Result Value Ref Range Status   Specimen Description   Final    ARTERY Performed at [facility], [address] [place], [place] [address]    Special Requests   Final    RIGHT AV GRAFT AND STENTS CULTURE Performed at [facility], [address] [place], [place] [address]    Gram Stain   Final    RARE WBC PRESENT, PREDOMINANTLY PMN NO ORGANISMS SEEN    Culture   Final    No growth aerobically or anaerobically. Performed at [facility], [place] [address], [place], [place] [address]    Report Status 03/09/2019 FINAL  Final  Aerobic/Anaerobic Culture (surgical/deep wound)     Status: None   Collection Time: 03/04/19 11:29 AM   Specimen: PATH Other; Tissue  Result Value Ref Range Status   Specimen Description   Final  ARTERY Performed at [facility], [address], [place], [place] [address]    Special Requests   Final    FLUID AROUND AV GRAFT Performed at [facility], [place], [place] [address]    Gram Stain   Final    FEW WBC PRESENT, PREDOMINANTLY PMN NO ORGANISMS SEEN    Culture   Final    No growth aerobically or anaerobically. Performed at [facility], [place] [address] [place],  [address]    Report Status 03/09/2019 FINAL  Final    Coagulation Studies: Recent Labs    03/09/19 0528 03/11/19 0502  LABPROT 14.1 14.6  INR 1.1 1.2    Urinalysis: No results for input(s): COLORURINE, LABSPEC, PHURINE, GLUCOSEU, HGBUR, BILIRUBINUR, KETONESUR, PROTEINUR, UROBILINOGEN, NITRITE, LEUKOCYTESUR in the last 72 hours.  Invalid input(s): APPERANCEUR    Imaging: EEG  Result Date: 03/10/2019 Lora Havens, MD     03/10/2019  2:54 PM Patient Name: Jamie Arias MRN: [id number] Epilepsy Attending: Lora Havens Referring Physician/Provider: Dr Leotis Pain Date: 03/10/2019 Duration: 23.01 minutes Patient history: 64 year old female with altered mental status.  EEG to evaluate for seizures. Level of alertness: Lethargic AEDs during EEG study: None Technical aspects: This EEG study was done with scalp electrodes positioned according to the 10-20 International system of electrode placement. Electrical activity was acquired at a sampling rate of 500Hz  and reviewed  with a high frequency filter of 70Hz  and a low frequency filter of 1Hz . EEG data were recorded continuously and digitally stored. Description: EEG showed continuous generalized 3 to 5 Hz theta-delta slowing.  One episode of general twitching/movement was noted.  Concomitant EEG showed muscle artifact, no EEG change suggestive of seizure was seen.  Physiologic photic driving was not seen during photic stimulation.  Hyperventilation was not performed. Abnormality -Continuous slow, generalized IMPRESSION: This study is suggestive of moderate to severe diffuse encephalopathy, nonspecific etiology.  One episode of jaw twitching was recorded as described above without EEG change and was therefore nonepileptic. No seizures or epileptiform discharges were seen throughout the recording. Priyanka O Yadav   [place] EKG SITE RITE  Result Date: 03/10/2019 If Site Rite image not attached, placement could not be confirmed due to current cardiac rhythm.    Medications:   . sodium chloride Stopped (03/01/19 0429)  . feeding supplement (OSMOLITE 1.5 CAL) 1,000 mL (03/11/19 [id number])  . heparin 1,200 Units/hr (03/11/19 [id number])  . vancomycin Stopped (03/09/19 1424)   . aspirin  81 mg Oral Daily  . B-complex with vitamin C  1 tablet Per Tube Daily  . Chlorhexidine Gluconate Cloth  6 each Topical Q0600  . epoetin (EPOGEN/PROCRIT) injection  4,000 Units Intravenous Q M,W,F-HD  . feeding supplement (PRO-STAT SUGAR FREE 64)  30 mL Per Tube Daily  . fluticasone  2 spray Each Nare Daily  . free water  20 mL Per Tube Q4H  . insulin aspart  0-15 Units Subcutaneous Q4H  . levothyroxine  88 mcg Oral QAC breakfast  . mouth rinse  15 mL Mouth Rinse BID  . midodrine  10 mg Oral TID WC  . pantoprazole (PROTONIX) IV  40 mg Intravenous Q12H  . psyllium  1 packet Per Tube BID  . venlafaxine  75 mg Oral BID WC  . warfarin  5 mg Oral ONCE-1800  . Warfarin - Pharmacist Dosing Inpatient   Does not apply q1800  . zinc sulfate  220 mg Oral  Daily   sodium chloride,  ipratropium-albuterol, ondansetron (ZOFRAN) IV  Assessment/ Plan:  Jamie Arias is a 64 y.o. white female with end-stage renal disease on hemodialysis, atrial fibrillation, chronic diastolic congestive heart failure, coronary artery disease status post CABG, diabetes mellitus type II, hypertension, hyperlipidemia, hypothyroidism, Covid positive in July 2020, recent nursing home stay in [place] Prolonged hospitalization for MRSA bacteremia/sepsis and now with acute encephalopathy  [facility] [address] Left AVG 81.5kg  1. End Stage Renal Disease: on hemodialysis. With complication of dialysis device.  -rt arm AVG removed 12/30 due to suspected source of bacteremia -Patient seen during dialysis treatment.  Tolerating well.  Next dialysis treatment on Friday.  2. Hypotension -Maintain the patient on midodrine 10 mg 3 times daily.  3. Anemia of chronic kidney disease:   Status post PRBC transfusion on 12/22 -Maintain the patient on Epogen 4000 units IV with dialysis treatments.  4. Secondary Hyperparathyroidism:  Phosphorus currently at target at 3.5.  Continue to periodically monitor. Lab Results  Component Value Date   PTH 135 (H) 02/22/2017   CALCIUM 8.4 (L) 03/11/2019   CAION 1.20 07/03/2013   PHOS 3.5 03/06/2019    5. Acute Encephalopathy:  -Remains encephalopathic, arousable but confused.  6. Sepsis from MRSA - Abx as per ID team - Source of MRSA has not been identified -AV graft  removed 12/30  7. A Fib -Management as per hospitalist and cardiology.  8. DM with CKD Insulin dependent Lab Results  Component Value Date   HGBA1C 5.3 02/09/2019   9. Left arm DVT Dx 12/9  10.  Acute respiratory failure Patient doing well post extubation at the moment.     LOS: 30 Nilam Quakenbush 1/6/20219:48 AM

## 2019-03-11 NOTE — Consult Note (Signed)
Pharmacy Antibiotic Note  Jamie Arias is a 64 y.o. female admitted on 02/09/2019 with methicillin resistant Staphylococcus aureus.  Pharmacy has been consulted for vancomycin dosing (previous antibiotics included daptomycin and ceftaroline) She recently underwent PEG placement and AVG removal.  Patient received vancomycin loading dose 1500 mg x1 with dialysis on 12/24.  (per ID: Post covid morbidity- Long COVID)    Plan:  Maintenance dose:  Vancomycin 750mg  IV qHD MWF  Patient received dose 12/28 with HD, but missed dose on 12/30 due to surgery.  However, patient received shorter 2 hour HD session on 12/30.  Based on pharmacokinetic estimation, patient is likely subtherapeutic (calculated level ~18).  Therefore, give vancomycin 500 mg x1 dose tonight.  Plan to draw random vancomycin level tomorrow before HD (12/31).   Goal pre-HD vanco level is 20 to 25 per Dr. Delaine [name].  1/1:  Vancomycin level pre-HD= 24 mcg/ml.  Continue current therapy. Pt received dose with HD. F/u next Vanc level pre-HD with 3rd HD from this level (Wed 1/6 ?)  1/4:  Vancomycin level pre-HD= 20 mcg/ml. Continue current therapy. Pt received dose with HD. F/u next Vanc level pre-HD with 3rd HD from this level (Wed 1/6) as scheduled.   1/6: Vancomycin level pre-HD= 20 mcg/ml. Continue current therapy. F/u next Vanc level pre-HD with 3rd HD from this level (Wed 1/13) as scheduled.     Height: 5' 4.02" (162.6 cm) Weight: 192 lb 7.4 oz (87.3 kg) IBW/kg (Calculated) : 54.74  Temp (24hrs), Avg:99 F (37.2 C), Min:98.5 F (36.9 C), Max:99.4 F (37.4 C)  Recent Labs  Lab 03/06/19 0345 03/07/19 0423 03/08/19 0700 03/09/19 0528 03/10/19 0600 03/11/19 0502  WBC 7.2 8.9 8.9 9.1 9.1 9.8  CREATININE 2.63* 1.88* 2.70* 3.29*  --  2.97*  VANCORANDOM 24  --   --  20  --  20    Estimated Creatinine Clearance: 20.2 mL/min (A) (by C-G formula based on SCr of 2.97 mg/dL (H)).    Antimicrobials this  admission: ceftaroline 12/14 >> 12/21 daptomycin 12/14 >> 12/26 Vancomycin 12/24 >>  Microbiology results: 12/30 Aerobic/Anaerobic Cx:  NG 12/16 BCx: NG 12/15 WCx MRSA 12/13 BCx: MRSA   Thank you for allowing pharmacy to be a part of this patient's care.  Kristeen Miss, PharmD Clinical Pharmacist  03/11/2019 8:23 AM

## 2019-03-11 NOTE — Progress Notes (Signed)
   03/11/19 1230  Neurological  Level of Consciousness Responds to Pain  Respiratory  Respiratory Pattern Regular;Unlabored  Bilateral Breath Sounds Diminished  Cardiac  Pulse Irregular  Heart Sounds S1, S2  ECG Monitor Yes  STATUS REMAINS UNCHANGED PT STABLE NO DISTRESS NOTED VITALS STABLE CVC WDL UFG ACHEIVED

## 2019-03-11 NOTE — Consult Note (Addendum)
ANTICOAGULATION CONSULT NOTE  Pharmacy Consult for Warfarin  Indication: DVT  Patient Measurements: Height: 5' 4.02" (162.6 cm) Weight: 192 lb 7.4 oz (87.3 kg) IBW/kg (Calculated) : 54.74 Heparin Dosing Weight: 71 kg  Vital Signs: Temp: 98.5 F (36.9 C) (01/06 0359) Temp Source: Oral (01/06 0359) BP: 134/69 (01/06 0359) Pulse Rate: 86 (01/06 0359)  Labs: Recent Labs    03/09/19 0528 03/09/19 1241 03/09/19 1515 03/09/19 2300 03/10/19 0600 03/10/19 0851 03/11/19 0502  HGB 7.4*   < > 8.0*  --  7.4*  --  7.2*  HCT 23.7*  --  24.8*  --  23.9*  --  22.6*  PLT 233  --   --   --  261  --  245  LABPROT 14.1  --   --   --   --   --   --   INR 1.1  --   --   --   --   --   --   HEPARINUNFRC 0.19*  --   --  0.51  --  0.44 0.34  CREATININE 3.29*  --   --   --   --   --  2.97*   < > = values in this interval not displayed.   Estimated Creatinine Clearance: 20.2 mL/min (A) (by C-G formula based on SCr of 2.97 mg/dL (H)).  Medical History: Past Medical History:  Diagnosis Date  . Anemia   . Chronic diastolic CHF (congestive heart failure) ([place])    a. Due to ischemic cardiomyopathy. EF as low as 35%, improved to normal s/p CABG; b. echo 07/06/13: EF 55-60%, no RWMAs, mod TR, trivial pericardial effusion not c/w tamponade physiology;  c. 10/2015 Echo: EF 65%, Gr1 DD, triv AI, mild MR, mildly dil LA, mod TR, PASP 18mmHg.  Marland Kitchen Coronary artery disease    a. NSTEMI 06/2013; b.cath: severe three-vessel CAD w/ EF 30% & mild-mod MR; c. s/p 3 vessel CABG 07/02/13 (LIMA-LAD, SVG-OM, and SVG-RPDA);  d. 10/2015 MV: no ischemia/infarct.  . Diabetes mellitus without complication ([place])   . Diabetic neuropathy ([place])   . Dialysis patient [facility])    MWF  . ESRD (end stage renal disease) ([place])    a. 12/2015 initiated - mwf dialysis.  Marland Kitchen GERD (gastroesophageal reflux disease)   . Hyperlipidemia   . Hypertension   . Hypothyroidism   . Myocardial infarction ([place]) 2015  . Neuropathy   . Pleural effusion 2015   . Pulmonary hypertension ([place])   . Renal insufficiency   . Wears dentures    full lower    Medications:  Scheduled:  . aspirin  81 mg Oral Daily  . B-complex with vitamin C  1 tablet Per Tube Daily  . Chlorhexidine Gluconate Cloth  6 each Topical Q0600  . epoetin (EPOGEN/PROCRIT) injection  4,000 Units Intravenous Q M,W,F-HD  . feeding supplement (PRO-STAT SUGAR FREE 64)  30 mL Per Tube Daily  . fluticasone  2 spray Each Nare Daily  . free water  20 mL Per Tube Q4H  . insulin aspart  0-15 Units Subcutaneous Q4H  . levothyroxine  88 mcg Oral QAC breakfast  . mouth rinse  15 mL Mouth Rinse BID  . midodrine  10 mg Oral TID WC  . pantoprazole (PROTONIX) IV  40 mg Intravenous Q12H  . psyllium  1 packet Per Tube BID  . venlafaxine  75 mg Oral BID WC  . zinc sulfate  220 mg Oral Daily    Assessment: 64 y.o.  female admitted on 02/09/2019 with MRSA  bacteremia. She was on apixaban PTA but last dose was prior to admission (12/7). [place] 12/15 revealed age-indeterminate short segment occlusive DVT involving the mid humeral aspect of one of the paired brachial veins. On 12/20 a femoral central line was placed. At this time, heparin will be used to bridge to warfarin. Will stop heparin when INR is at goal.  Drug interactions:   Levothyroxine: may increase/decrease INR   Hgb is low yet stable. Today's Hgb is 7.2.  Per reports during rounds and notes, patient seems to have a poor prognosis. Will therefore, choose starting with warfarin 5 mg instead of 7.5 mg.    Will order BL INR. Addendum @ [phone]: INR 1.2  Goal of Therapy:  INR: 2-3 Monitor platelets by anticoagulation protocol: Yes   Plan:  1. Will order warfarin 5 mg x1 dose and follow INR daily until INR is therapeutic x2. For now, will continue to order monitor CBC daily given patient is on heparin.   03/11/2019 8:11 AM

## 2019-03-11 NOTE — TOC Progression Note (Signed)
Transition of Care [facility]) - Progression Note    Patient Details  Name: COLE WILLIAM MRN: [id number] Date of Birth: 1956/02/11  Transition of Care [facility]) CM/SW Contact  Shelbie Ammons, RN Phone Number: 03/11/2019, 4:09 PM  Clinical Narrative:     RNCM placed call to Lauren at [facility] to get an update on placement. Lauren reports at this time she is still on their waiting list. She further reports that they will have a meeting in the morning and she will call this CM back with any updates as to possible timing of bed availability.     Expected Discharge Plan: [place] Barriers to Discharge: Continued Medical Work up  Expected Discharge Plan and Services Expected Discharge Plan: [place] In-house Referral: Clinical Social Work     Living arrangements for the past 2 months: Mobile Home                                       Social Determinants of Health (SDOH) Interventions    Readmission Risk Interventions Readmission Risk Prevention Plan 02/13/2019 09/15/2018  Transportation Screening - Complete  Medication Review Press photographer) Complete Complete  PCP or Specialist appointment within 3-5 days of discharge - Complete  HRI or [place] Complete Complete  SW Recovery Care/Counseling Consult Complete Complete  [place] Not Applicable Complete  Some recent data might be hidden

## 2019-03-11 NOTE — Progress Notes (Signed)
   03/11/19 0905  Neurological  Level of Consciousness Responds to Pain  Respiratory  Respiratory Pattern Regular;Unlabored  Bilateral Breath Sounds Diminished  Cardiac  Pulse Irregular  Heart Sounds S1, S2  ECG Monitor Yes  PT STABLE FOR HD VITALS STABLE NO DISTRESS NOTED CVC WDL UFG 1L

## 2019-03-11 NOTE — Consult Note (Signed)
ANTICOAGULATION CONSULT NOTE  Pharmacy Consult for heparin Indication: DVT  Patient Measurements: Height: 5' 4.02" (162.6 cm) Weight: 192 lb 7.4 oz (87.3 kg) IBW/kg (Calculated) : 54.74 Heparin Dosing Weight: 71 kg  Vital Signs: Temp: 98.5 F (36.9 C) (01/06 0359) Temp Source: Oral (01/06 0359) BP: 134/69 (01/06 0359) Pulse Rate: 86 (01/06 0359)  Labs: Recent Labs    03/08/19 0700 03/09/19 0528 03/09/19 1241 03/09/19 1515 03/09/19 2300 03/10/19 0600 03/10/19 0851 03/11/19 0502  HGB 7.1* 7.4*   < > 8.0*  --  7.4*  --  7.2*  HCT 21.0* 23.7*  --  24.8*  --  23.9*  --  22.6*  PLT 183 233  --   --   --  261  --  245  LABPROT  --  14.1  --   --   --   --   --   --   INR  --  1.1  --   --   --   --   --   --   HEPARINUNFRC 0.36 0.19*  --   --  0.51  --  0.44 0.34  CREATININE 2.70* 3.29*  --   --   --   --   --   --    < > = values in this interval not displayed.   Estimated Creatinine Clearance: 18.2 mL/min (A) (by C-G formula based on SCr of 3.29 mg/dL (H)).  Medical History: Past Medical History:  Diagnosis Date  . Anemia   . Chronic diastolic CHF (congestive heart failure) ([place])    a. Due to ischemic cardiomyopathy. EF as low as 35%, improved to normal s/p CABG; b. echo 07/06/13: EF 55-60%, no RWMAs, mod TR, trivial pericardial effusion not c/w tamponade physiology;  c. 10/2015 Echo: EF 65%, Gr1 DD, triv AI, mild MR, mildly dil LA, mod TR, PASP 81mmHg.  Marland Kitchen Coronary artery disease    a. NSTEMI 06/2013; b.cath: severe three-vessel CAD w/ EF 30% & mild-mod MR; c. s/p 3 vessel CABG 07/02/13 (LIMA-LAD, SVG-OM, and SVG-RPDA);  d. 10/2015 MV: no ischemia/infarct.  . Diabetes mellitus without complication ([place])   . Diabetic neuropathy ([place])   . Dialysis patient [facility])    MWF  . ESRD (end stage renal disease) ([place])    a. 12/2015 initiated - mwf dialysis.  Marland Kitchen GERD (gastroesophageal reflux disease)   . Hyperlipidemia   . Hypertension   . Hypothyroidism   . Myocardial infarction  ([place]) 2015  . Neuropathy   . Pleural effusion 2015  . Pulmonary hypertension ([place])   . Renal insufficiency   . Wears dentures    full lower    Medications:  Scheduled:  . aspirin  81 mg Oral Daily  . B-complex with vitamin C  1 tablet Per Tube Daily  . Chlorhexidine Gluconate Cloth  6 each Topical Q0600  . epoetin (EPOGEN/PROCRIT) injection  4,000 Units Intravenous Q M,W,F-HD  . feeding supplement (PRO-STAT SUGAR FREE 64)  30 mL Per Tube Daily  . fluticasone  2 spray Each Nare Daily  . free water  20 mL Per Tube Q4H  . insulin aspart  0-15 Units Subcutaneous Q4H  . levothyroxine  88 mcg Oral QAC breakfast  . mouth rinse  15 mL Mouth Rinse BID  . midodrine  10 mg Oral TID WC  . pantoprazole (PROTONIX) IV  40 mg Intravenous Q12H  . psyllium  1 packet Per Tube BID  . venlafaxine  75 mg Oral BID WC  .  zinc sulfate  220 mg Oral Daily    Assessment: 64 y.o. female admitted on 02/09/2019 with MRSA  bacteremia. She was on apixaban PTA but last dose was prior to admission (12/7). [place] 12/15 revealed age-indeterminate short segment occlusive DVT involving the mid humeral aspect of one of the paired brachial veins.   Additionally, she was too confused and disoriented to swallow oral medications. On 12/20 a femoral central line was placed.    Goal of Therapy:  Heparin Level: 0.3 - 0.7 units/mL Monitor platelets by anticoagulation protocol: Yes   Plan:  01/05 @ 0930 HL 0.44  Therapeutic. Confirmed with RN no bleeding events noted today. Will contine heparin rate of 1200 units/hr and will recheck HL with AM labs, H/h continues to trend low will continue to monitor yet stable. 0106 0502 HL = 0.34, therapeutic.  CBC stable.  Continue heparin at 1200 units/hr and recheck HL and CBC with am labs  Hart Robinsons, PharmD Clinical Pharmacist   03/11/2019 5:49 AM

## 2019-03-11 NOTE — Progress Notes (Signed)
PROGRESS NOTE    Jamie Arias  [id number] DOB: [birth year] DOA: 02/09/2019 PCP: [name], MD   Brief Narrative:  [name] is a 64 y.o. white female with end-stage renal disease on hemodialysis, atrial fibrillation, chronic diastolic congestive heart failure, coronary artery disease status post CABG, diabetes mellitus type II, hypertension, hyperlipidemia, hypothyroidism, Covid positive in July 2020, recent nursing home stay in [place] Prolonged hospitalization for MRSA bacteremia/sepsis and now with acute encephalopathy.  Subjective: Patient was minimally responsive, only respond to painful stimuli.  Assessment & Plan:   Principal Problem:   Sepsis due to methicillin resistant Staphylococcus aureus (MRSA) ([place]) Active Problems:   Type 2 diabetes mellitus with ESRD (end-stage renal disease) (HCC)   Atrial fibrillation, chronic (HCC)   Hypotension   Chronic diastolic CHF (congestive heart failure) (HCC)   ESRD (end stage renal disease) (HCC)   Altered level of consciousness   Decubitus ulcer of heel, bilateral, stage 2 (HCC)   MRSA bacteremia   Arm DVT (deep venous thromboembolism), acute, left (HCC)   Acute encephalopathy   Acute metabolic encephalopathy   Acute blood loss anemia   AF (paroxysmal atrial fibrillation) ([place])   Diarrhea   Oropharyngeal dysphagia  Sepsis secondary to MRSA bacteremia.  No obvious source.  Right arm graft was removed on 03/04/2019. -Continue with vancomycin for 6-week.  Hypotension.  Patient initially managed with Levophed. Blood pressure maintained on midodrine. -Continue midodrine  ESRD ([facility]).  Patient received her dialysis today.  Currently using tunneled catheter as graft was removed due to MRSA bacteremia. -Continue with scheduled dialysis.  Acute metabolic encephalopathy.  Mental status still impaired.  MRI of brain and EEG negative.  TSH normal. Overall prognosis seems poor.  Left arm DVT.  Patient was on  Heparin.  Recent diagnosis of left arm DVT on 03/03/2019. -Start her on Coumadin through tube. -Discontinue heparin once INR therapeutic.  Hypothyroidism. -Continue with Synthroid.  Paroxysmal A. Fib.. Currently in sinus. -Continue amiodarone. -Continue anticoagulation with Coumadin.  Type 2 diabetes. -Continue SSI.  Chronic diastolic heart failure.  Volume being managed by dialysis.  Bilateral decubitus ulcers.  Present on admission. -Continue with wound care.  Nutrition.  PEG placed 03/03/2019.  Continue tube feeding.  Acute hypoxic respiratory failure.  Resolved.  Patient was intubated initially, extubated on 03/05/2019.  Objective: Vitals:   03/11/19 1145 03/11/19 1215 03/11/19 1230 03/11/19 1337  BP: 127/65 102/77 134/66 118/68  Pulse: 87 93 99 92  Resp: 19 20 16 17   Temp:   97.6 F (36.4 C) 98.6 F (37 C)  TempSrc:    Axillary  SpO2: 100% 100% 99% 100%  Weight:      Height:        Intake/Output Summary (Last 24 hours) at 03/11/2019 1837 Last data filed at 03/11/2019 1215 Gross per 24 hour  Intake 1232.1 ml  Output 1000 ml  Net 232.1 ml   Filed Weights   03/02/19 0930 03/06/19 1500 03/10/19 1220  Weight: 75.5 kg 83 kg 87.3 kg    Examination:  General exam: Chronically ill-appearing lady, minimal response to painful stimuli, in no acute distress. Respiratory system: Clear to auscultation. Respiratory effort normal. Cardiovascular system: S1 & S2 heard, RRR. No JVD, murmurs, rubs, gallops or clicks. Gastrointestinal system: Soft, nontender, nondistended, bowel sounds positive. Central nervous system: Unable to assess as patient is minimally responsive and not following any commands. Extremities: No edema, no cyanosis, pulses intact and symmetrical. Skin: Decubitus ulcers on both  heels. Psychiatry: Judgement and insight appear impaired.  DVT prophylaxis: Heparin/Coumadin Code Status: DNR Family Communication: No family at bedside. Disposition Plan:  Waiting for bed offer at [facility].  Consultants:   Nephrology  Infectious disease  Vascular surgery  Neurology  Critical care specialist   Procedures:  Antimicrobials:  Vancomycin.  Data Reviewed: I have personally reviewed following labs and imaging studies  CBC: Recent Labs  Lab 03/07/19 0423 03/08/19 0700 03/09/19 0528 03/09/19 1515 03/10/19 0600 03/11/19 0502  WBC 8.9 8.9 9.1  --  9.1 9.8  HGB 7.5* 7.1* 7.4* 8.0* 7.4* 7.2*  HCT 22.1* 21.0* 23.7* 24.8* 23.9* 22.6*  MCV 87.0 88.6 94.0  --  96.4 97.0  PLT 173 183 233  --  261 [id number]   Basic Metabolic Panel: Recent Labs  Lab 03/05/19 0356 03/06/19 0345 03/07/19 0423 03/08/19 0700 03/09/19 0528 03/11/19 0502  NA 137 134* 136 135 134* 137  K 3.6 3.9 3.7 3.7 3.8 3.4*  CL 99 100 99 100 101 102  CO2 26 25 26 26 23 29   GLUCOSE 102* 206* 191* 122* 141* 151*  BUN 14 23 19  30* 39* 33*  CREATININE 2.16* 2.63* 1.88* 2.70* 3.29* 2.97*  CALCIUM 8.5* 8.0* 8.2* 8.1* 8.4* 8.4*  MG 1.7 2.1  --  2.2 2.2 2.1  PHOS 2.8 3.5  --   --   --   --    GFR: Estimated Creatinine Clearance: 20.2 mL/min (A) (by C-G formula based on SCr of 2.97 mg/dL (H)). Liver Function Tests: Recent Labs  Lab 03/05/19 0356 03/09/19 0528 03/11/19 0502  AST 25 18 17   ALT 16 7 7   ALKPHOS 88 68 74  BILITOT 0.8 0.9 0.7  PROT 5.6* 4.9* 5.0*  ALBUMIN 1.9* 1.9* 1.8*   No results for input(s): LIPASE, AMYLASE in the last 168 hours. No results for input(s): AMMONIA in the last 168 hours. Coagulation Profile: Recent Labs  Lab 03/09/19 0528 03/11/19 0502  INR 1.1 1.2   Cardiac Enzymes: No results for input(s): CKTOTAL, CKMB, CKMBINDEX, TROPONINI in the last 168 hours. BNP (last 3 results) No results for input(s): PROBNP in the last 8760 hours. HbA1C: No results for input(s): HGBA1C in the last 72 hours. CBG: Recent Labs  Lab 03/11/19 0116 03/11/19 0359 03/11/19 0746 03/11/19 1335 03/11/19 1638  GLUCAP 137* 138* 128* 92 104*   Lipid  Profile: No results for input(s): CHOL, HDL, LDLCALC, TRIG, CHOLHDL, LDLDIRECT in the last 72 hours. Thyroid Function Tests: No results for input(s): TSH, T4TOTAL, FREET4, T3FREE, THYROIDAB in the last 72 hours. Anemia Panel: Recent Labs    03/09/19 0528  RETICCTPCT 3.8*   Sepsis Labs: No results for input(s): PROCALCITON, LATICACIDVEN in the last 168 hours.  Recent Results (from the past 240 hour(s))  Aerobic/Anaerobic Culture (surgical/deep wound)     Status: None   Collection Time: 03/04/19 11:04 AM   Specimen: PATH Other; Tissue  Result Value Ref Range Status   Specimen Description   Final    ARTERY Performed at [facility], [address], [place], [place] [address]    Special Requests   Final    RIGHT AV GRAFT AND STENTS CULTURE Performed at [facility], [address] [place], [place] [address]    Gram Stain   Final    RARE WBC PRESENT, PREDOMINANTLY PMN NO ORGANISMS SEEN    Culture   Final    No growth aerobically or anaerobically. Performed at [facility], [place]  [address] [place], [place] [address]    Report Status 03/09/2019 FINAL  Final  Aerobic/Anaerobic Culture (surgical/deep wound)     Status: None   Collection Time: 03/04/19 11:29 AM   Specimen: PATH Other; Tissue  Result Value Ref Range Status   Specimen Description   Final    ARTERY Performed at [facility], [address], [place], [place] [address]    Special Requests   Final    FLUID AROUND AV GRAFT Performed at [facility], [address] [place], [place] [address]    Gram Stain   Final    FEW WBC PRESENT, PREDOMINANTLY PMN NO ORGANISMS SEEN    Culture   Final    No growth aerobically or anaerobically. Performed at [facility], [place] [address], [place], [place] [address]    Report Status 03/09/2019 FINAL  Final     Radiology Studies: EEG  Result Date: 03/10/2019 Lora Havens, MD     03/10/2019  2:54 PM Patient Name: Jamie Arias MRN: [id number] Epilepsy Attending: Lora Havens Referring Physician/Provider: Dr Leotis Pain Date: 03/10/2019 Duration: 23.01 minutes Patient history: 64 year old female with altered mental status.  EEG to evaluate for seizures. Level of alertness: Lethargic AEDs during EEG study: None Technical aspects: This EEG study was done with scalp electrodes positioned according to the 10-20 International system of electrode placement. Electrical activity was acquired at a sampling rate of 500Hz  and reviewed with a high frequency filter of 70Hz  and a low frequency filter of 1Hz . EEG data were recorded continuously and digitally stored. Description: EEG showed continuous generalized 3 to 5 Hz theta-delta slowing.  One episode of general twitching/movement was noted.  Concomitant EEG showed muscle artifact, no EEG change suggestive of seizure was seen.  Physiologic photic driving was not seen during photic stimulation.  Hyperventilation was not performed. Abnormality -Continuous slow, generalized IMPRESSION: This study is suggestive of moderate to severe diffuse encephalopathy, nonspecific etiology.  One episode of jaw twitching was recorded as described above without EEG change and was therefore nonepileptic. No seizures or epileptiform discharges were seen throughout the recording. Priyanka O Yadav   [place] EKG SITE RITE  Result Date: 03/10/2019 If Site Rite image not attached, placement could not be confirmed due to current cardiac rhythm.   Scheduled Meds: . aspirin  81 mg Oral Daily  . B-complex with vitamin C  1 tablet Per Tube Daily  . Chlorhexidine Gluconate Cloth  6 each Topical Q0600  . epoetin (EPOGEN/PROCRIT) injection  4,000 Units Intravenous Q M,W,F-HD  . feeding supplement (PRO-STAT SUGAR FREE 64)  30 mL Per Tube Daily  . fluticasone  2 spray Each Nare Daily  . free water  20 mL Per Tube Q4H  . insulin aspart  0-15 Units Subcutaneous Q4H  . levothyroxine  88 mcg Oral QAC breakfast  .  mouth rinse  15 mL Mouth Rinse BID  . midodrine  10 mg Oral TID WC  . pantoprazole (PROTONIX) IV  40 mg Intravenous Q12H  . psyllium  1 packet Per Tube BID  . venlafaxine  75 mg Oral BID WC  . warfarin  5 mg Oral ONCE-1800  . Warfarin - Pharmacist Dosing Inpatient   Does not apply q1800  . zinc sulfate  220 mg Oral Daily   Continuous Infusions: . sodium chloride Stopped (03/01/19 0429)  . feeding supplement (OSMOLITE 1.5 CAL) 1,000 mL (03/11/19 [id number])  . heparin 1,200 Units/hr (03/11/19 [id number])  . vancomycin 750 mg (  03/11/19 1358)     LOS: 30 days   Time spent: 45 min.  I personally reviewed her chart.  Lorella Nimrod, MD [facility] Pager [phone]  If 7PM-7AM, please contact night-coverage [email or web address] Password [facility] 03/11/2019, 6:37 PM   This record has been created using Dragon voice recognition software. Errors have been sought and corrected,but may not always be located. Such creation errors do not reflect on the standard of care.

## 2019-03-12 LAB — GLUCOSE, CAPILLARY
Glucose-Capillary: 139 mg/dL — ABNORMAL HIGH (ref 70–99)
Glucose-Capillary: 156 mg/dL — ABNORMAL HIGH (ref 70–99)
Glucose-Capillary: 156 mg/dL — ABNORMAL HIGH (ref 70–99)
Glucose-Capillary: 160 mg/dL — ABNORMAL HIGH (ref 70–99)
Glucose-Capillary: 166 mg/dL — ABNORMAL HIGH (ref 70–99)
Glucose-Capillary: 196 mg/dL — ABNORMAL HIGH (ref 70–99)

## 2019-03-12 LAB — CBC
HCT: 24.7 % — ABNORMAL LOW (ref 36.0–46.0)
Hemoglobin: 7.8 g/dL — ABNORMAL LOW (ref 12.0–15.0)
MCH: 31 pg (ref 26.0–34.0)
MCHC: 31.6 g/dL (ref 30.0–36.0)
MCV: 98 fL (ref 80.0–100.0)
Platelets: 279 10*3/uL (ref 150–400)
RBC: 2.52 MIL/uL — ABNORMAL LOW (ref 3.87–5.11)
RDW: 21.8 % — ABNORMAL HIGH (ref 11.5–15.5)
WBC: 10 10*3/uL (ref 4.0–10.5)
nRBC: 0.7 % — ABNORMAL HIGH (ref 0.0–0.2)

## 2019-03-12 LAB — CMV DNA, QUANTITATIVE, PCR
CMV DNA Quant: 246 IU/mL
Log10 CMV Qn DNA Pl: 2.391 log10 IU/mL

## 2019-03-12 LAB — PROTIME-INR
INR: 1.2 (ref 0.8–1.2)
Prothrombin Time: 15.2 seconds (ref 11.4–15.2)

## 2019-03-12 LAB — HEPARIN LEVEL (UNFRACTIONATED): Heparin Unfractionated: 0.54 IU/mL (ref 0.30–0.70)

## 2019-03-12 MED ORDER — WARFARIN SODIUM 5 MG PO TABS
5.0000 mg | ORAL_TABLET | Freq: Once | ORAL | Status: AC
  Administered 2019-03-12: 16:00:00 5 mg via ORAL
  Filled 2019-03-12: qty 1

## 2019-03-12 NOTE — Progress Notes (Signed)
[facility]  ROUNDING NOTE   Subjective:  Patient lethargic but arousable. Completed dialysis yesterday. Due for dialysis again tomorrow.    Objective:  Vital signs in last 24 hours:  Temp:  [97.5 F (36.4 C)-98.8 F (37.1 C)] 97.5 F (36.4 C) (01/07 1143) Pulse Rate:  [80-99] 82 (01/07 1143) Resp:  [16-20] 18 (01/07 1143) BP: (118-145)/(66-91) 145/91 (01/07 1143) SpO2:  [98 %-100 %] 100 % (01/07 1143)  Weight change:  Filed Weights   03/02/19 0930 03/06/19 1500 03/10/19 1220  Weight: 75.5 kg 83 kg 87.3 kg    Intake/Output: I/O last 3 completed shifts: In: 1232.1 [I.V.:132.1; NG/GT:1100] Out: 1000 [Other:1000]   Intake/Output this shift:  Total I/O In: -  Out: 600 [Stool:600]  Physical Exam: General: NAD, laying in bed  HEENT [place]/AT OM moist  Lungs:  Scatterd rhonchi, normal effort  Heart: Regular rate and rhythm  Abdomen:  Soft, nontender, PEG tube in place  Extremities: 2+ upper and lower extremity edema  Neurologic: Lethargic but arousable  Access: Left IJ PC  Rectal rube in place   Basic Metabolic Panel: Recent Labs  Lab 03/06/19 0345 03/07/19 0423 03/08/19 0700 03/09/19 0528 03/11/19 0502  NA 134* 136 135 134* 137  K 3.9 3.7 3.7 3.8 3.4*  CL 100 99 100 101 102  CO2 25 26 26 23 29   GLUCOSE 206* 191* 122* 141* 151*  BUN 23 19 30* 39* 33*  CREATININE 2.63* 1.88* 2.70* 3.29* 2.97*  CALCIUM 8.0* 8.2* 8.1* 8.4* 8.4*  MG 2.1  --  2.2 2.2 2.1  PHOS 3.5  --   --   --   --     Liver Function Tests: Recent Labs  Lab 03/09/19 0528 03/11/19 0502  AST 18 17  ALT 7 7  ALKPHOS 68 74  BILITOT 0.9 0.7  PROT 4.9* 5.0*  ALBUMIN 1.9* 1.8*   No results for input(s): LIPASE, AMYLASE in the last 168 hours. No results for input(s): AMMONIA in the last 168 hours.  CBC: Recent Labs  Lab 03/08/19 0700 03/09/19 0528 03/09/19 1515 03/10/19 0600 03/11/19 0502 03/12/19 0536  WBC 8.9 9.1  --  9.1 9.8 10.0  HGB 7.1* 7.4* 8.0* 7.4* 7.2*  7.8*  HCT 21.0* 23.7* 24.8* 23.9* 22.6* 24.7*  MCV 88.6 94.0  --  96.4 97.0 98.0  PLT 183 233  --  261 245 279    Cardiac Enzymes: No results for input(s): CKTOTAL, CKMB, CKMBINDEX, TROPONINI in the last 168 hours.  BNP: Invalid input(s): POCBNP  CBG: Recent Labs  Lab 03/11/19 2137 03/12/19 0009 03/12/19 0441 03/12/19 0806 03/12/19 1142  GLUCAP 213* 196* 160* 156* 139*    Microbiology: Results for orders placed or performed during the hospital encounter of 02/09/19  Culture, blood (routine x 2)     Status: Abnormal   Collection Time: 02/09/19  2:29 PM   Specimen: BLOOD LEFT ARM  Result Value Ref Range Status   Specimen Description   Final    BLOOD LEFT ARM Performed at [facility], [address], [place], [place] [address]    Special Requests   Final    BOTTLES DRAWN AEROBIC AND ANAEROBIC Blood Culture adequate volume Performed at [facility], [address], [place], [place] [address]    Culture  Setup Time   Final    GRAM POSITIVE COCCI IN BOTH AEROBIC AND ANAEROBIC BOTTLES CRITICAL VALUE NOTED.  VALUE IS CONSISTENT WITH PREVIOUSLY REPORTED AND CALLED VALUE. Performed at [facility], [address], [place], [place] [address]    Culture (A)  Final    STAPHYLOCOCCUS AUREUS SUSCEPTIBILITIES PERFORMED ON PREVIOUS CULTURE WITHIN THE LAST 5 DAYS. Performed at [facility], [place] [address] [place], [place] [address]    Report Status 02/12/2019 FINAL  Final  Culture, blood (routine x 2)     Status: Abnormal   Collection Time: 02/09/19  2:38 PM   Specimen: BLOOD LEFT ARM  Result Value Ref Range Status   Specimen Description   Final    BLOOD LEFT ARM Performed at [facility], [address], [place], [place] [address]    Special Requests   Final    BOTTLES DRAWN AEROBIC AND ANAEROBIC Blood Culture adequate volume Performed at [facility], [address] [place], [place] [address]    Culture  Setup  Time   Final    GRAM POSITIVE COCCI IN BOTH AEROBIC AND ANAEROBIC BOTTLES CRITICAL RESULT CALLED TO, READ BACK BY AND VERIFIED WITH: [place] [id number] 02/10/2019 HNM Performed at [facility], [place] [address] [place], [place] [address]    Culture METHICILLIN RESISTANT STAPHYLOCOCCUS AUREUS (A)  Final   Report Status 02/12/2019 FINAL  Final   Organism ID, Bacteria METHICILLIN RESISTANT STAPHYLOCOCCUS AUREUS  Final      Susceptibility   Methicillin resistant staphylococcus aureus - MIC*    CIPROFLOXACIN >=8 RESISTANT Resistant     ERYTHROMYCIN >=8 RESISTANT Resistant     GENTAMICIN <=0.5 SENSITIVE Sensitive     OXACILLIN >=4 RESISTANT Resistant     TETRACYCLINE <=1 SENSITIVE Sensitive     VANCOMYCIN 1 SENSITIVE Sensitive     TRIMETH/SULFA <=10 SENSITIVE Sensitive     CLINDAMYCIN RESISTANT Resistant     RIFAMPIN <=0.5 SENSITIVE Sensitive     Inducible Clindamycin POSITIVE Resistant     * METHICILLIN RESISTANT STAPHYLOCOCCUS AUREUS  Blood Culture ID Panel (Reflexed)     Status: Abnormal   Collection Time: 02/09/19  2:38 PM  Result Value Ref Range Status   Enterococcus species NOT DETECTED NOT DETECTED Final   Listeria monocytogenes NOT DETECTED NOT DETECTED Final   Staphylococcus species DETECTED (A) NOT DETECTED Final    Comment: CRITICAL RESULT CALLED TO, READ BACK BY AND VERIFIED WITH: Hart Robinsons PHARMD [id number] 02/10/2019 HNM    Staphylococcus aureus (BCID) DETECTED (A) NOT DETECTED Final    Comment: Methicillin (oxacillin)-resistant Staphylococcus aureus (MRSA). MRSA is predictably resistant to beta-lactam antibiotics (except ceftaroline). Preferred therapy is vancomycin unless clinically contraindicated. Patient requires contact precautions if  hospitalized. CRITICAL RESULT CALLED TO, READ BACK BY AND VERIFIED WITH: Hart Robinsons PHARMD [id number] 02/10/2019 HNM    Methicillin resistance DETECTED (A) NOT DETECTED Final    Comment: CRITICAL RESULT CALLED TO, READ BACK BY AND  VERIFIED WITH: Hart Robinsons PHARMD [id number] 02/10/2019 HNM    Streptococcus species NOT DETECTED NOT DETECTED Final   Streptococcus agalactiae NOT DETECTED NOT DETECTED Final   Streptococcus pneumoniae NOT DETECTED NOT DETECTED Final   Streptococcus pyogenes NOT DETECTED NOT DETECTED Final   Acinetobacter baumannii NOT DETECTED NOT DETECTED Final   Enterobacteriaceae species NOT DETECTED NOT DETECTED Final   Enterobacter cloacae complex NOT DETECTED NOT DETECTED Final   Escherichia coli NOT DETECTED NOT DETECTED Final   Klebsiella oxytoca NOT DETECTED NOT DETECTED Final   Klebsiella pneumoniae NOT DETECTED NOT DETECTED Final   Proteus species NOT DETECTED NOT DETECTED Final   Serratia marcescens NOT DETECTED NOT DETECTED Final  Haemophilus influenzae NOT DETECTED NOT DETECTED Final   Neisseria meningitidis NOT DETECTED NOT DETECTED Final   Pseudomonas aeruginosa NOT DETECTED NOT DETECTED Final   Candida albicans NOT DETECTED NOT DETECTED Final   Candida glabrata NOT DETECTED NOT DETECTED Final   Candida krusei NOT DETECTED NOT DETECTED Final   Candida parapsilosis NOT DETECTED NOT DETECTED Final   Candida tropicalis NOT DETECTED NOT DETECTED Final    Comment: Performed at [facility], [place], [place] [address]  SARS CORONAVIRUS 2 (TAT 6-24 HRS) Nasopharyngeal Nasopharyngeal Swab     Status: None   Collection Time: 02/09/19  2:54 PM   Specimen: Nasopharyngeal Swab  Result Value Ref Range Status   SARS Coronavirus 2 NEGATIVE NEGATIVE Final    Comment: (NOTE) SARS-CoV-2 target nucleic acids are NOT DETECTED. The SARS-CoV-2 RNA is generally detectable in upper and lower respiratory specimens during the acute phase of infection. Negative results do not preclude SARS-CoV-2 infection, do not rule out co-infections with other pathogens, and should not be used as the sole basis for treatment or other patient management decisions. Negative results must be combined with  clinical observations, patient history, and epidemiological information. The expected result is Negative. Fact Sheet for Patients: SugarRoll.be Fact Sheet for Healthcare Providers: https://www.woods-mathews.com/ This test is not yet approved or cleared by the [place] FDA and  has been authorized for detection and/or diagnosis of SARS-CoV-2 by FDA under an Emergency Use Authorization (EUA). This EUA will remain  in effect (meaning this test can be used) for the duration of the COVID-19 declaration under Section 56 4(b)(1) of the Act, 21 U.S.C. section 360bbb-3(b)(1), unless the authorization is terminated or revoked sooner. Performed at [facility], [place] [address], [place], [place] [address]   MRSA PCR Screening     Status: Abnormal   Collection Time: 02/10/19  5:03 PM   Specimen: Nasopharyngeal  Result Value Ref Range Status   MRSA by PCR POSITIVE (A) NEGATIVE Final    Comment:        The GeneXpert MRSA Assay (FDA approved for NASAL specimens only), is one component of a comprehensive MRSA colonization surveillance program. It is not intended to diagnose MRSA infection nor to guide or monitor treatment for MRSA infections. RESULT CALLED TO, READ BACK BY AND VERIFIED WITH: ALEKSEY FRASER @1839  02/10/19 MJU Performed at [facility], [address] [place], [place] [address]   Culture, blood (routine x 2)     Status: Abnormal   Collection Time: 02/11/19 12:31 PM   Specimen: BLOOD  Result Value Ref Range Status   Specimen Description   Final    BLOOD PORTA CATH Performed at [facility] [address], [place], [place] [address]    Special Requests   Final    BOTTLES DRAWN AEROBIC AND ANAEROBIC Blood Culture adequate volume Performed at [facility], [address] [place], [place] [address]    Culture  Setup Time   Final    GRAM POSITIVE COCCI ANAEROBIC BOTTLE ONLY CRITICAL RESULT CALLED  TO, READ BACK BY AND VERIFIED WITH: SCOTT HALL AT [id number] 02/12/2019 [facility]  Performed at [facility], [address] [place], [place] [address]    Culture (A)  Final    STAPHYLOCOCCUS AUREUS SUSCEPTIBILITIES PERFORMED ON PREVIOUS CULTURE WITHIN THE LAST 5 DAYS. Performed at [facility], [place] [address], [place], [place] [address]    Report Status 02/13/2019 FINAL  Final  Culture, blood (  routine x 2)     Status: None   Collection Time: 02/11/19  3:57 PM   Specimen: BLOOD  Result Value Ref Range Status   Specimen Description BLOOD PORTA CATH  Final   Special Requests   Final    BOTTLES DRAWN AEROBIC AND ANAEROBIC Blood Culture adequate volume   Culture   Final    NO GROWTH 5 DAYS Performed at [facility], [address] [place], [place] [address]    Report Status 02/16/2019 FINAL  Final  CULTURE, BLOOD (ROUTINE X 2) w Reflex to ID Panel     Status: Abnormal   Collection Time: 02/13/19  2:18 PM   Specimen: BLOOD LEFT HAND  Result Value Ref Range Status   Specimen Description BLOOD LEFT HAND  Final   Special Requests   Final    BOTTLES DRAWN AEROBIC AND ANAEROBIC Blood Culture results may not be optimal due to an inadequate volume of blood received in culture bottles   Culture  Setup Time   Final    AEROBIC BOTTLE ONLY GRAM POSITIVE COCCI IN CLUSTERS CRITICAL RESULT CALLED TO, READ BACK BY AND VERIFIED WITH: Herbert Pun AT [id number] ON 02/14/2019 [place]. Performed at [facility], [place] [address], [place], [place] [address]    Culture METHICILLIN RESISTANT STAPHYLOCOCCUS AUREUS (A)  Final   Report Status 02/16/2019 FINAL  Final   Organism ID, Bacteria METHICILLIN RESISTANT STAPHYLOCOCCUS AUREUS  Final      Susceptibility   Methicillin resistant staphylococcus aureus - MIC*    CIPROFLOXACIN >=8 RESISTANT Resistant     ERYTHROMYCIN >=8 RESISTANT Resistant     GENTAMICIN <=0.5 SENSITIVE Sensitive     OXACILLIN >=4 RESISTANT Resistant     TETRACYCLINE <=1  SENSITIVE Sensitive     VANCOMYCIN <=0.5 SENSITIVE Sensitive     TRIMETH/SULFA <=10 SENSITIVE Sensitive     CLINDAMYCIN RESISTANT Resistant     RIFAMPIN <=0.5 SENSITIVE Sensitive     Inducible Clindamycin POSITIVE Resistant     * METHICILLIN RESISTANT STAPHYLOCOCCUS AUREUS  Blood Culture ID Panel (Reflexed)     Status: Abnormal   Collection Time: 02/13/19  2:18 PM  Result Value Ref Range Status   Enterococcus species NOT DETECTED NOT DETECTED Final   Listeria monocytogenes NOT DETECTED NOT DETECTED Final   Staphylococcus species DETECTED (A) NOT DETECTED Final    Comment: CRITICAL RESULT CALLED TO, READ BACK BY AND VERIFIED WITH: ABBEY ELLINGTON AT [id number] ON 02/14/2019 [place].    Staphylococcus aureus (BCID) DETECTED (A) NOT DETECTED Final    Comment: Methicillin (oxacillin)-resistant Staphylococcus aureus (MRSA). MRSA is predictably resistant to beta-lactam antibiotics (except ceftaroline). Preferred therapy is vancomycin unless clinically contraindicated. Patient requires contact precautions if  hospitalized. CRITICAL RESULT CALLED TO, READ BACK BY AND VERIFIED WITH: Herbert Pun AT [id number] ON 02/14/2019 [place].    Methicillin resistance DETECTED (A) NOT DETECTED Final    Comment: CRITICAL RESULT CALLED TO, READ BACK BY AND VERIFIED WITH: Herbert Pun AT [id number] ON 02/14/2019 [name].    Streptococcus species NOT DETECTED NOT DETECTED Final   Streptococcus agalactiae NOT DETECTED NOT DETECTED Final   Streptococcus pneumoniae NOT DETECTED NOT DETECTED Final   Streptococcus pyogenes NOT DETECTED NOT DETECTED Final   Acinetobacter baumannii NOT DETECTED NOT DETECTED Final   Enterobacteriaceae species NOT DETECTED NOT DETECTED Final   Enterobacter cloacae complex NOT DETECTED NOT DETECTED Final   Escherichia coli NOT DETECTED NOT DETECTED Final   Klebsiella oxytoca NOT DETECTED NOT DETECTED Final  Klebsiella pneumoniae NOT DETECTED NOT DETECTED Final   Proteus species NOT DETECTED NOT DETECTED  Final   Serratia marcescens NOT DETECTED NOT DETECTED Final   Haemophilus influenzae NOT DETECTED NOT DETECTED Final   Neisseria meningitidis NOT DETECTED NOT DETECTED Final   Pseudomonas aeruginosa NOT DETECTED NOT DETECTED Final   Candida albicans NOT DETECTED NOT DETECTED Final   Candida glabrata NOT DETECTED NOT DETECTED Final   Candida krusei NOT DETECTED NOT DETECTED Final   Candida parapsilosis NOT DETECTED NOT DETECTED Final   Candida tropicalis NOT DETECTED NOT DETECTED Final    Comment: Performed at [facility], [address] [place], [place] [address]  CULTURE, BLOOD (ROUTINE X 2) w Reflex to ID Panel     Status: None   Collection Time: 02/13/19  3:09 PM   Specimen: BLOOD  Result Value Ref Range Status   Specimen Description BLOOD BLOOD LEFT HAND  Final   Special Requests   Final    BOTTLES DRAWN AEROBIC AND ANAEROBIC Blood Culture adequate volume   Culture   Final    NO GROWTH 5 DAYS Performed at [facility], [address], [place], [place] [address]    Report Status 02/18/2019 FINAL  Final  Culture, blood (routine x 2)     Status: Abnormal   Collection Time: 02/15/19 12:49 PM   Specimen: BLOOD  Result Value Ref Range Status   Specimen Description   Final    BLOOD LT HAND Performed at [facility], [address] [place], [place] [address]    Special Requests   Final    BOTTLES DRAWN AEROBIC AND ANAEROBIC Blood Culture results may not be optimal due to an inadequate volume of blood received in culture bottles Performed at [facility], [address] [place], [place] [address]    Culture  Setup Time   Final    GRAM POSITIVE COCCI IN BOTH AEROBIC AND ANAEROBIC BOTTLES CRITICAL RESULT CALLED TO, READ BACK BY AND VERIFIED WITHEleonore Chiquito AT [id number] 02/16/2019 [place]DR Performed at [facility], [address] [place], [place] [address]    Culture (A)  Final    METHICILLIN RESISTANT STAPHYLOCOCCUS  AUREUS STAPHYLOCOCCUS SPECIES (COAGULASE NEGATIVE) THE SIGNIFICANCE OF ISOLATING THIS ORGANISM FROM A SINGLE SET OF BLOOD CULTURES WHEN MULTIPLE SETS ARE DRAWN IS UNCERTAIN. PLEASE NOTIFY THE MICROBIOLOGY DEPARTMENT WITHIN ONE WEEK IF SPECIATION AND SENSITIVITIES ARE REQUIRED. Performed at [facility], [place] [address] [place], [place] [address]    Report Status 02/18/2019 FINAL  Final   Organism ID, Bacteria METHICILLIN RESISTANT STAPHYLOCOCCUS AUREUS  Final      Susceptibility   Methicillin resistant staphylococcus aureus - MIC*    CIPROFLOXACIN >=8 RESISTANT Resistant     ERYTHROMYCIN >=8 RESISTANT Resistant     GENTAMICIN <=0.5 SENSITIVE Sensitive     OXACILLIN >=4 RESISTANT Resistant     TETRACYCLINE <=1 SENSITIVE Sensitive     VANCOMYCIN <=0.5 SENSITIVE Sensitive     TRIMETH/SULFA <=10 SENSITIVE Sensitive     CLINDAMYCIN RESISTANT Resistant     RIFAMPIN <=0.5 SENSITIVE Sensitive     Inducible Clindamycin POSITIVE Resistant     * METHICILLIN RESISTANT STAPHYLOCOCCUS AUREUS  Aerobic Culture (superficial specimen)     Status: None   Collection Time: 02/17/19  1:31 PM   Specimen: Heel; Wound  Result Value Ref Range Status   Specimen Description   Final    HEEL Performed at [facility], [address] [place],  [place] [address]    Special Requests   Final    NONE Performed at [facility], [place], [place] [address]    Gram Stain   Final    NO WBC SEEN MODERATE GRAM POSITIVE COCCI IN PAIRS    Culture   Final    ABUNDANT METHICILLIN RESISTANT STAPHYLOCOCCUS AUREUS ABUNDANT DIPHTHEROIDS(CORYNEBACTERIUM SPECIES) Standardized susceptibility testing for this organism is not available. Performed at [facility], [place] [address], [place], [place] [address]    Report Status 02/19/2019 FINAL  Final   Organism ID, Bacteria METHICILLIN RESISTANT STAPHYLOCOCCUS AUREUS  Final      Susceptibility   Methicillin resistant staphylococcus  aureus - MIC*    CIPROFLOXACIN >=8 RESISTANT Resistant     ERYTHROMYCIN >=8 RESISTANT Resistant     GENTAMICIN <=0.5 SENSITIVE Sensitive     OXACILLIN >=4 RESISTANT Resistant     TETRACYCLINE <=1 SENSITIVE Sensitive     VANCOMYCIN 1 SENSITIVE Sensitive     TRIMETH/SULFA <=10 SENSITIVE Sensitive     CLINDAMYCIN RESISTANT Resistant     RIFAMPIN <=0.5 SENSITIVE Sensitive     Inducible Clindamycin POSITIVE Resistant     * ABUNDANT METHICILLIN RESISTANT STAPHYLOCOCCUS AUREUS  CULTURE, BLOOD (ROUTINE X 2) w Reflex to ID Panel     Status: None   Collection Time: 02/18/19 11:29 PM   Specimen: BLOOD  Result Value Ref Range Status   Specimen Description BLOOD LEFT ARM  Final   Special Requests   Final    BOTTLES DRAWN AEROBIC AND ANAEROBIC Blood Culture adequate volume   Culture   Final    NO GROWTH 5 DAYS Performed at [facility], [address] [place], [place] [address]    Report Status 02/23/2019 FINAL  Final  CULTURE, BLOOD (ROUTINE X 2) w Reflex to ID Panel     Status: None   Collection Time: 02/18/19 11:29 PM   Specimen: BLOOD  Result Value Ref Range Status   Specimen Description BLOOD LEFT ARM  Final   Special Requests   Final    BOTTLES DRAWN AEROBIC AND ANAEROBIC Blood Culture adequate volume   Culture   Final    NO GROWTH 5 DAYS Performed at [facility], [address], [place], [place] [address]    Report Status 02/23/2019 FINAL  Final  Aerobic/Anaerobic Culture (surgical/deep wound)     Status: None   Collection Time: 03/04/19 11:04 AM   Specimen: PATH Other; Tissue  Result Value Ref Range Status   Specimen Description   Final    ARTERY Performed at [facility], [address] [place], [place] [address]    Special Requests   Final    RIGHT AV GRAFT AND STENTS CULTURE Performed at [facility], [address] [place], [place] [address]    Gram Stain   Final    RARE WBC PRESENT, PREDOMINANTLY PMN NO ORGANISMS SEEN     Culture   Final    No growth aerobically or anaerobically. Performed at [facility], [name] [address], [place], [place] [address]    Report Status 03/09/2019 FINAL  Final  Aerobic/Anaerobic Culture (surgical/deep wound)     Status: None   Collection Time: 03/04/19 11:29 AM   Specimen: PATH Other; Tissue  Result Value Ref Range Status   Specimen Description   Final    ARTERY Performed at [facility], [address] [place],  [address]    Special Requests  Final    FLUID AROUND AV GRAFT Performed at [facility], [place], [place] [address]    Gram Stain   Final    FEW WBC PRESENT, PREDOMINANTLY PMN NO ORGANISMS SEEN    Culture   Final    No growth aerobically or anaerobically. Performed at [facility], [place] [address], [place], [place] [address]    Report Status 03/09/2019 FINAL  Final    Coagulation Studies: Recent Labs    03/11/19 0502 03/12/19 0536  LABPROT 14.6 15.2  INR 1.2 1.2    Urinalysis: No results for input(s): COLORURINE, LABSPEC, PHURINE, GLUCOSEU, HGBUR, BILIRUBINUR, KETONESUR, PROTEINUR, UROBILINOGEN, NITRITE, LEUKOCYTESUR in the last 72 hours.  Invalid input(s): APPERANCEUR    Imaging: EEG  Result Date: 03/10/2019 Lora Havens, MD     03/10/2019  2:54 PM Patient Name: Jamie Arias MRN: [id number] Epilepsy Attending: Lora Havens Referring Physician/Provider: Dr Leotis Pain Date: 03/10/2019 Duration: 23.01 minutes Patient history: 64 year old female with altered mental status.  EEG to evaluate for seizures. Level of alertness: Lethargic AEDs during EEG study: None Technical aspects: This EEG study was done with scalp electrodes positioned according to the 10-20 International system of electrode placement. Electrical activity was acquired at a sampling rate of 500Hz  and reviewed with a high frequency filter of 70Hz  and a low frequency filter of 1Hz . EEG data were recorded continuously and  digitally stored. Description: EEG showed continuous generalized 3 to 5 Hz theta-delta slowing.  One episode of general twitching/movement was noted.  Concomitant EEG showed muscle artifact, no EEG change suggestive of seizure was seen.  Physiologic photic driving was not seen during photic stimulation.  Hyperventilation was not performed. Abnormality -Continuous slow, generalized IMPRESSION: This study is suggestive of moderate to severe diffuse encephalopathy, nonspecific etiology.  One episode of jaw twitching was recorded as described above without EEG change and was therefore nonepileptic. No seizures or epileptiform discharges were seen throughout the recording. Priyanka O Yadav   [place] EKG SITE RITE  Result Date: 03/10/2019 If Site Rite image not attached, placement could not be confirmed due to current cardiac rhythm.    Medications:   . sodium chloride Stopped (03/01/19 0429)  . feeding supplement (OSMOLITE 1.5 CAL) 1,000 mL (03/12/19 0947)  . heparin 1,200 Units/hr (03/12/19 0318)  . vancomycin 750 mg (03/11/19 1358)   . aspirin  81 mg Oral Daily  . B-complex with vitamin C  1 tablet Per Tube Daily  . Chlorhexidine Gluconate Cloth  6 each Topical Q0600  . epoetin (EPOGEN/PROCRIT) injection  4,000 Units Intravenous Q M,W,F-HD  . feeding supplement (PRO-STAT SUGAR FREE 64)  30 mL Per Tube Daily  . fluticasone  2 spray Each Nare Daily  . free water  20 mL Per Tube Q4H  . insulin aspart  0-15 Units Subcutaneous Q4H  . levothyroxine  88 mcg Oral QAC breakfast  . mouth rinse  15 mL Mouth Rinse BID  . midodrine  10 mg Oral TID WC  . pantoprazole (PROTONIX) IV  40 mg Intravenous Q12H  . psyllium  1 packet Per Tube BID  . venlafaxine  75 mg Oral BID WC  . warfarin  5 mg Oral ONCE-1800  . Warfarin - Pharmacist Dosing Inpatient   Does not apply q1800  . zinc sulfate  220 mg Oral Daily   sodium chloride, ipratropium-albuterol, ondansetron (ZOFRAN) IV  Assessment/ Plan:  Ms. MCKENLY BENCOMO is a 64 y.o. white female with  end-stage renal disease on hemodialysis, atrial fibrillation, chronic diastolic congestive heart failure, coronary artery disease status post CABG, diabetes mellitus type II, hypertension, hyperlipidemia, hypothyroidism, Covid positive in July 2020, recent nursing home stay in [place] Prolonged hospitalization for MRSA bacteremia/sepsis and now with acute encephalopathy  [facility] [address] Left AVG 81.5kg  1. End Stage Renal Disease: on hemodialysis. With complication of dialysis device.  -rt arm AVG removed 12/30 due to suspected source of bacteremia -Patient due for dialysis treatment again tomorrow.  Orders to be prepared.  2. Hypotension -Blood pressure currently acceptable.  Maintain the patient on midodrine 10 mg 3 times daily.  3. Anemia of chronic kidney disease:   Status post PRBC transfusion on 12/22 -Hemoglobin currently 7.8.  Maintain the patient on Epogen 4000 IV with dialysis treatment.  4. Secondary Hyperparathyroidism:  Recheck serum phosphorus with next dialysis treatment. Lab Results  Component Value Date   PTH 135 (H) 02/22/2017   CALCIUM 8.4 (L) 03/11/2019   CAION 1.20 07/03/2013   PHOS 3.5 03/06/2019    5. Acute Encephalopathy:  -Remains encephalopathic, arousable but confused.  6. Sepsis from MRSA - Abx as per ID team - Source of MRSA has not been identified -AV graft  removed 12/30  7. A Fib -Management as per hospitalist and cardiology.  8. DM with CKD Insulin dependent Lab Results  Component Value Date   HGBA1C 5.3 02/09/2019   9. Left arm DVT Dx 12/9  10.  Acute respiratory failure Patient doing well post extubation at the moment.     LOS: 31 Janielle [place]el[place] 1/7/202112:23 PM

## 2019-03-12 NOTE — Consult Note (Signed)
ANTICOAGULATION CONSULT NOTE  Pharmacy Consult for heparin Indication: DVT  Patient Measurements: Height: 5' 4.02" (162.6 cm) Weight: 192 lb 7.4 oz (87.3 kg) IBW/kg (Calculated) : 54.74 Heparin Dosing Weight: 71 kg  Vital Signs: Temp: 98.7 F (37.1 C) (01/07 0538) Temp Source: Oral (01/07 0538) BP: 138/70 (01/07 0538) Pulse Rate: 80 (01/07 0538)  Labs: Recent Labs    03/09/19 2300 03/10/19 0600 03/10/19 0851 03/11/19 0502 03/12/19 0536  HGB   < > 7.4*  --  7.2* 7.8*  HCT  --  23.9*  --  22.6* 24.7*  PLT  --  261  --  245 279  LABPROT  --   --   --  14.6 15.2  INR  --   --   --  1.2 1.2  HEPARINUNFRC  --   --  0.44 0.34 0.54  CREATININE  --   --   --  2.97*  --    < > = values in this interval not displayed.   Estimated Creatinine Clearance: 20.2 mL/min (A) (by C-G formula based on SCr of 2.97 mg/dL (H)).  Medical History: Past Medical History:  Diagnosis Date  . Anemia   . Chronic diastolic CHF (congestive heart failure) ([place])    a. Due to ischemic cardiomyopathy. EF as low as 35%, improved to normal s/p CABG; b. echo 07/06/13: EF 55-60%, no RWMAs, mod TR, trivial pericardial effusion not c/w tamponade physiology;  c. 10/2015 Echo: EF 65%, Gr1 DD, triv AI, mild MR, mildly dil LA, mod TR, PASP 31mmHg.  Marland Kitchen Coronary artery disease    a. NSTEMI 06/2013; b.cath: severe three-vessel CAD w/ EF 30% & mild-mod MR; c. s/p 3 vessel CABG 07/02/13 (LIMA-LAD, SVG-OM, and SVG-RPDA);  d. 10/2015 MV: no ischemia/infarct.  . Diabetes mellitus without complication ([place])   . Diabetic neuropathy ([place])   . Dialysis patient [facility])    MWF  . ESRD (end stage renal disease) ([place])    a. 12/2015 initiated - mwf dialysis.  Marland Kitchen GERD (gastroesophageal reflux disease)   . Hyperlipidemia   . Hypertension   . Hypothyroidism   . Myocardial infarction ([place]) 2015  . Neuropathy   . Pleural effusion 2015  . Pulmonary hypertension ([place])   . Renal insufficiency   . Wears dentures    full lower     Medications:  Scheduled:  . aspirin  81 mg Oral Daily  . B-complex with vitamin C  1 tablet Per Tube Daily  . Chlorhexidine Gluconate Cloth  6 each Topical Q0600  . epoetin (EPOGEN/PROCRIT) injection  4,000 Units Intravenous Q M,W,F-HD  . feeding supplement (PRO-STAT SUGAR FREE 64)  30 mL Per Tube Daily  . fluticasone  2 spray Each Nare Daily  . free water  20 mL Per Tube Q4H  . insulin aspart  0-15 Units Subcutaneous Q4H  . levothyroxine  88 mcg Oral QAC breakfast  . mouth rinse  15 mL Mouth Rinse BID  . midodrine  10 mg Oral TID WC  . pantoprazole (PROTONIX) IV  40 mg Intravenous Q12H  . psyllium  1 packet Per Tube BID  . venlafaxine  75 mg Oral BID WC  . Warfarin - Pharmacist Dosing Inpatient   Does not apply q1800  . zinc sulfate  220 mg Oral Daily    Assessment: 64 y.o. female admitted on 02/09/2019 with MRSA  bacteremia. She was on apixaban PTA but last dose was prior to admission (12/7). [place] 12/15 revealed age-indeterminate short segment occlusive DVT involving  the mid humeral aspect of one of the paired brachial veins.   Additionally, she was too confused and disoriented to swallow oral medications. On 12/20 a femoral central line was placed.    Goal of Therapy:  Heparin Level: 0.3 - 0.7 units/mL Monitor platelets by anticoagulation protocol: Yes   Plan:  01/05 @ 0930 HL 0.44  Therapeutic. Confirmed with RN no bleeding events noted today. Will contine heparin rate of 1200 units/hr and will recheck HL with AM labs, H/h continues to trend low will continue to monitor yet stable. 0106 0502 HL = 0.34, therapeutic.  CBC stable.  Continue heparin at 1200 units/hr and recheck HL and CBC with am labs 0107 0536 HL = 0.54, therapeutic.  CBC stable.  Continue heparin at 1200 units/hr and recheck HL and CBC with am labs  Hart Robinsons, PharmD Clinical Pharmacist   03/12/2019 6:38 AM

## 2019-03-12 NOTE — Progress Notes (Signed)
PROGRESS NOTE    Jamie Arias  [id number] DOB: 1955-09-01 DOA: 02/09/2019 PCP: [name], MD   Brief Narrative:  Jamie Arias is a 64 y.o. white female with end-stage renal disease on hemodialysis, atrial fibrillation, chronic diastolic congestive heart failure, coronary artery disease status post CABG, diabetes mellitus type II, hypertension, hyperlipidemia, hypothyroidism, Covid positive in July 2020, recent nursing home stay in [place] Prolonged hospitalization for MRSA bacteremia/sepsis and now with acute encephalopathy.  Subjective: Patient just opened her eyes when calling her name.  Not following any commands.  Husband was at bedside.  According to him she is having progressively worsening health issues since July when she had Covid.  Before that she was able to perform her ADLs.  Assessment & Plan:   Principal Problem:   Sepsis due to methicillin resistant Staphylococcus aureus (MRSA) ([place]) Active Problems:   Type 2 diabetes mellitus with ESRD (end-stage renal disease) (HCC)   Atrial fibrillation, chronic (HCC)   Hypotension   Chronic diastolic CHF (congestive heart failure) (HCC)   ESRD (end stage renal disease) (HCC)   Altered level of consciousness   Decubitus ulcer of heel, bilateral, stage 2 (HCC)   MRSA bacteremia   Arm DVT (deep venous thromboembolism), acute, left (HCC)   Acute encephalopathy   Acute metabolic encephalopathy   Acute blood loss anemia   AF (paroxysmal atrial fibrillation) ([place])   Diarrhea   Oropharyngeal dysphagia   Abscess  Sepsis secondary to MRSA bacteremia.  No obvious source.  Right arm graft was removed on 03/04/2019. -Continue with vancomycin for 6-week.  Hypotension.  Patient initially managed with Levophed. Blood pressure maintained on midodrine. -Continue midodrine  ESRD (M [facility]).  Patient received her dialysis today.  Currently using tunneled catheter as graft was removed due to MRSA bacteremia. -Continue with  scheduled dialysis.  Acute metabolic encephalopathy.  Mental status still impaired.  MRI of brain and EEG negative.  TSH normal. Overall prognosis seems poor.  Left arm DVT.  Patient was on Heparin.  Recent diagnosis of left arm DVT on 03/03/2019. -Start her on Coumadin through tube. -INR still subtherapeutic at 1.2. -Discontinue heparin once INR therapeutic.  Hypothyroidism. -Continue with Synthroid.  Paroxysmal A. Fib.. Currently in sinus. -Continue amiodarone. -Continue anticoagulation with Coumadin.  Type 2 diabetes. -Continue SSI.  Chronic diastolic heart failure.  Volume being managed by dialysis.  Bilateral decubitus ulcers.  Present on admission. -Continue with wound care.  Nutrition.  PEG placed 03/03/2019.  Continue tube feeding.  Acute hypoxic respiratory failure.  Resolved.  Patient was intubated initially, extubated on 03/05/2019.  Objective: Vitals:   03/12/19 0538 03/12/19 0907 03/12/19 1143 03/12/19 1613  BP: 138/70 140/67 (!) 145/91 (!) 167/65  Pulse: 80 81 82 81  Resp: 20 19 18 18   Temp: 98.7 F (37.1 C) 98.7 F (37.1 C) (!) 97.5 F (36.4 C) 99 F (37.2 C)  TempSrc: Oral Axillary Oral Axillary  SpO2: 100% 98% 100% 98%  Weight:      Height:        Intake/Output Summary (Last 24 hours) at 03/12/2019 1709 Last data filed at 03/12/2019 1011 Gross per 24 hour  Intake --  Output 600 ml  Net -600 ml   Filed Weights   03/02/19 0930 03/06/19 1500 03/10/19 1220  Weight: 75.5 kg 83 kg 87.3 kg    Examination:  General exam: Chronically ill-appearing lady, minimal response to painful stimuli, in no acute distress. Respiratory system: Clear to auscultation. Respiratory effort  normal. Cardiovascular system: S1 & S2 heard, RRR. No JVD, murmurs, rubs, gallops or clicks. Gastrointestinal system: Soft, nontender, nondistended, bowel sounds positive. Central nervous system: Unable to assess as patient is minimally responsive and not following any  commands. Extremities: No edema, no cyanosis, pulses intact and symmetrical. Skin: Decubitus ulcers on both heels. Psychiatry: Judgement and insight appear impaired.  DVT prophylaxis: Heparin/Coumadin Code Status: DNR Family Communication: Husband was updated at bedside. Disposition Plan: Waiting for bed offer at [place].  Consultants:   Nephrology  Infectious disease  Vascular surgery  Neurology  Critical care specialist   Procedures:  Antimicrobials:  Vancomycin.  Data Reviewed: I have personally reviewed following labs and imaging studies  CBC: Recent Labs  Lab 03/08/19 0700 03/09/19 0528 03/09/19 1515 03/10/19 0600 03/11/19 0502 03/12/19 0536  WBC 8.9 9.1  --  9.1 9.8 10.0  HGB 7.1* 7.4* 8.0* 7.4* 7.2* 7.8*  HCT 21.0* 23.7* 24.8* 23.9* 22.6* 24.7*  MCV 88.6 94.0  --  96.4 97.0 98.0  PLT 183 233  --  261 245 [id number]   Basic Metabolic Panel: Recent Labs  Lab 03/06/19 0345 03/07/19 0423 03/08/19 0700 03/09/19 0528 03/11/19 0502  NA 134* 136 135 134* 137  K 3.9 3.7 3.7 3.8 3.4*  CL 100 99 100 101 102  CO2 25 26 26 23 29   GLUCOSE 206* 191* 122* 141* 151*  BUN 23 19 30* 39* 33*  CREATININE 2.63* 1.88* 2.70* 3.29* 2.97*  CALCIUM 8.0* 8.2* 8.1* 8.4* 8.4*  MG 2.1  --  2.2 2.2 2.1  PHOS 3.5  --   --   --   --    GFR: Estimated Creatinine Clearance: 20.2 mL/min (A) (by C-G formula based on SCr of 2.97 mg/dL (H)). Liver Function Tests: Recent Labs  Lab 03/09/19 0528 03/11/19 0502  AST 18 17  ALT 7 7  ALKPHOS 68 74  BILITOT 0.9 0.7  PROT 4.9* 5.0*  ALBUMIN 1.9* 1.8*   No results for input(s): LIPASE, AMYLASE in the last 168 hours. No results for input(s): AMMONIA in the last 168 hours. Coagulation Profile: Recent Labs  Lab 03/09/19 0528 03/11/19 0502 03/12/19 0536  INR 1.1 1.2 1.2   Cardiac Enzymes: No results for input(s): CKTOTAL, CKMB, CKMBINDEX, TROPONINI in the last 168 hours. BNP (last 3 results) No results for input(s): PROBNP in  the last 8760 hours. HbA1C: No results for input(s): HGBA1C in the last 72 hours. CBG: Recent Labs  Lab 03/12/19 0009 03/12/19 0441 03/12/19 0806 03/12/19 1142 03/12/19 1604  GLUCAP 196* 160* 156* 139* 156*   Lipid Profile: No results for input(s): CHOL, HDL, LDLCALC, TRIG, CHOLHDL, LDLDIRECT in the last 72 hours. Thyroid Function Tests: No results for input(s): TSH, T4TOTAL, FREET4, T3FREE, THYROIDAB in the last 72 hours. Anemia Panel: No results for input(s): VITAMINB12, FOLATE, FERRITIN, TIBC, IRON, RETICCTPCT in the last 72 hours. Sepsis Labs: No results for input(s): PROCALCITON, LATICACIDVEN in the last 168 hours.  Recent Results (from the past 240 hour(s))  Aerobic/Anaerobic Culture (surgical/deep wound)     Status: None   Collection Time: 03/04/19 11:04 AM   Specimen: PATH Other; Tissue  Result Value Ref Range Status   Specimen Description   Final    ARTERY Performed at [facility], [address], [place], [place] [address]    Special Requests   Final    RIGHT AV GRAFT AND STENTS CULTURE Performed at [facility], [address], [place], [place] [address]  Gram Stain   Final    RARE WBC PRESENT, PREDOMINANTLY PMN NO ORGANISMS SEEN    Culture   Final    No growth aerobically or anaerobically. Performed at [facility], [place] [address] [place], [place] [address]    Report Status 03/09/2019 FINAL  Final  Aerobic/Anaerobic Culture (surgical/deep wound)     Status: None   Collection Time: 03/04/19 11:29 AM   Specimen: PATH Other; Tissue  Result Value Ref Range Status   Specimen Description   Final    ARTERY Performed at [facility], [address], [place], [place] [address]    Special Requests   Final    FLUID AROUND AV GRAFT Performed at [facility], [address] [place], [place] [address]    Gram Stain   Final    FEW WBC PRESENT, PREDOMINANTLY PMN NO ORGANISMS SEEN    Culture   Final    No  growth aerobically or anaerobically. Performed at [facility], [place] [address], [place], [place] [address]    Report Status 03/09/2019 FINAL  Final     Radiology Studies: [place] EKG SITE RITE  Result Date: 03/10/2019 If Site Rite image not attached, placement could not be confirmed due to current cardiac rhythm.   Scheduled Meds: . aspirin  81 mg Oral Daily  . B-complex with vitamin C  1 tablet Per Tube Daily  . Chlorhexidine Gluconate Cloth  6 each Topical Q0600  . epoetin (EPOGEN/PROCRIT) injection  4,000 Units Intravenous Q M,W,F-HD  . feeding supplement (PRO-STAT SUGAR FREE 64)  30 mL Per Tube Daily  . fluticasone  2 spray Each Nare Daily  . free water  20 mL Per Tube Q4H  . insulin aspart  0-15 Units Subcutaneous Q4H  . levothyroxine  88 mcg Oral QAC breakfast  . mouth rinse  15 mL Mouth Rinse BID  . midodrine  10 mg Oral TID WC  . pantoprazole (PROTONIX) IV  40 mg Intravenous Q12H  . psyllium  1 packet Per Tube BID  . venlafaxine  75 mg Oral BID WC  . Warfarin - Pharmacist Dosing Inpatient   Does not apply q1800  . zinc sulfate  220 mg Oral Daily   Continuous Infusions: . sodium chloride Stopped (03/01/19 0429)  . feeding supplement (OSMOLITE 1.5 CAL) 1,000 mL (03/12/19 0947)  . heparin 1,200 Units/hr (03/12/19 0318)  . vancomycin 750 mg (03/11/19 1358)     LOS: 31 days   Time spent: 35 min.   Lorella Nimrod, MD [facility] Pager [phone]  If 7PM-7AM, please contact night-coverage [email or web address] Password [facility] 03/12/2019, 5:09 PM   This record has been created using Systems analyst. Errors have been sought and corrected,but may not always be located. Such creation errors do not reflect on the standard of care.

## 2019-03-12 NOTE — Progress Notes (Signed)
ID Awake , but non verbal Left shift  Patient Vitals for the past 24 hrs:  BP Temp Temp src Pulse Resp SpO2  03/12/19 1613 (!) 167/65 99 F (37.2 C) Axillary 81 18 98 %  03/12/19 1143 (!) 145/91 (!) 97.5 F (36.4 C) Oral 82 18 100 %  03/12/19 0907 140/67 98.7 F (37.1 C) Axillary 81 19 98 %  03/12/19 0538 138/70 98.7 F (37.1 C) Oral 80 20 100 %  03/11/19 2141 136/75 98.8 F (37.1 C) Oral 84 20 100 %   O/E awake Non verbal Does not move extremities PEG Chest B/l air entry Left IJ Left femoral line   CBC Latest Ref Rng & Units 03/12/2019 03/11/2019 03/10/2019  WBC 4.0 - 10.5 K/uL 10.0 9.8 9.1  Hemoglobin 12.0 - 15.0 g/dL 7.8(L) 7.2(L) 7.4(L)  Hematocrit 36.0 - 46.0 % 24.7(L) 22.6(L) 23.9(L)  Platelets 150 - 400 K/uL 279 245 261  Microbiology Aerobic culture 12/30 of the AV graft - no growth  02/18/19 : Bc NG  Impression/recommedation MRSA BACTEREMIA Initially persistent for a week and vanco was switched to dapto+ ceftaroline then- ( vanco MIC was good  at 1) Blood culture from 02/18/19 neg- AV graft was considered to be infected as It was malfunctioning and also had stents and it was removed on 03/04/19. MRI of the spine showed no lesions TEE could not be done due to encephalopathy Pt on IV vancomycin will need until 1/27/21a 6 week course  ENCEPHALOPATHY- unknown cause- MRI no acute lesions- could it be due to long COVID?-  Crypto(Neg), quantiferon gold, toxo(neg), CMV ( low level 342 copies), EBV dna ( 203 copies) are not significant NH3 negative- ? 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

## 2019-03-12 NOTE — Consult Note (Addendum)
ANTICOAGULATION CONSULT NOTE  Pharmacy Consult for Warfarin  Indication: DVT  Patient Measurements: Height: 5' 4.02" (162.6 cm) Weight: 192 lb 7.4 oz (87.3 kg) IBW/kg (Calculated) : 54.74 Heparin Dosing Weight: 71 kg  Vital Signs: Temp: 98.7 F (37.1 C) (01/07 0538) Temp Source: Oral (01/07 0538) BP: 138/70 (01/07 0538) Pulse Rate: 80 (01/07 0538)  Labs: Recent Labs    03/09/19 2300 03/10/19 0600 03/10/19 0851 03/11/19 0502 03/12/19 0536  HGB   < > 7.4*  --  7.2* 7.8*  HCT  --  23.9*  --  22.6* 24.7*  PLT  --  261  --  245 279  LABPROT  --   --   --  14.6 15.2  INR  --   --   --  1.2 1.2  HEPARINUNFRC  --   --  0.44 0.34 0.54  CREATININE  --   --   --  2.97*  --    < > = values in this interval not displayed.   Estimated Creatinine Clearance: 20.2 mL/min (A) (by C-G formula based on SCr of 2.97 mg/dL (H)).  Medical History: Past Medical History:  Diagnosis Date  . Anemia   . Chronic diastolic CHF (congestive heart failure) ([place])    a. Due to ischemic cardiomyopathy. EF as low as 35%, improved to normal s/p CABG; b. echo 07/06/13: EF 55-60%, no RWMAs, mod TR, trivial pericardial effusion not c/w tamponade physiology;  c. 10/2015 Echo: EF 65%, Gr1 DD, triv AI, mild MR, mildly dil LA, mod TR, PASP 32mmHg.  Marland Kitchen Coronary artery disease    a. NSTEMI 06/2013; b.cath: severe three-vessel CAD w/ EF 30% & mild-mod MR; c. s/p 3 vessel CABG 07/02/13 (LIMA-LAD, SVG-OM, and SVG-RPDA);  d. 10/2015 MV: no ischemia/infarct.  . Diabetes mellitus without complication ([place])   . Diabetic neuropathy ([place])   . Dialysis patient [facility])    MWF  . ESRD (end stage renal disease) ([place])    a. 12/2015 initiated - mwf dialysis.  Marland Kitchen GERD (gastroesophageal reflux disease)   . Hyperlipidemia   . Hypertension   . Hypothyroidism   . Myocardial infarction ([place]) 2015  . Neuropathy   . Pleural effusion 2015  . Pulmonary hypertension ([place])   . Renal insufficiency   . Wears dentures    full lower     Medications:  Scheduled:  . aspirin  81 mg Oral Daily  . B-complex with vitamin C  1 tablet Per Tube Daily  . Chlorhexidine Gluconate Cloth  6 each Topical Q0600  . epoetin (EPOGEN/PROCRIT) injection  4,000 Units Intravenous Q M,W,F-HD  . feeding supplement (PRO-STAT SUGAR FREE 64)  30 mL Per Tube Daily  . fluticasone  2 spray Each Nare Daily  . free water  20 mL Per Tube Q4H  . insulin aspart  0-15 Units Subcutaneous Q4H  . levothyroxine  88 mcg Oral QAC breakfast  . mouth rinse  15 mL Mouth Rinse BID  . midodrine  10 mg Oral TID WC  . pantoprazole (PROTONIX) IV  40 mg Intravenous Q12H  . psyllium  1 packet Per Tube BID  . venlafaxine  75 mg Oral BID WC  . Warfarin - Pharmacist Dosing Inpatient   Does not apply q1800  . zinc sulfate  220 mg Oral Daily    Assessment: 64 y.o. female admitted on 02/09/2019 with MRSA  bacteremia. She was on apixaban PTA but last dose was prior to admission (12/7). [place] 12/15 revealed age-indeterminate short segment occlusive DVT  involving the mid humeral aspect of one of the paired brachial veins. On 12/20 a femoral central line was placed. At this time, heparin will be used to bridge to warfarin. Will stop heparin when INR is at goal.  Drug interactions:   Levothyroxine: may increase/decrease INR   Hgb is low yet stable. Today's Hgb is 7.8. Per nurse, no bleeding concerns. Per reports during rounds and notes, patient seems to have a poor prognosis. Will therefore, choose starting with warfarin 5 mg instead of 7.5 mg.  Date INR  Dose 1/6 1.2 Warfarin 5 mg  1/7 1.2   Today's INR: Subtherapeutic  Goal of Therapy:  INR: 2-3 Monitor platelets by anticoagulation protocol: Yes   Plan:  1. Will order warfarin 5 mg x1 dose and follow INR daily until INR is therapeutic x2. For now, will continue to order monitor CBC daily given patient is on heparin.   03/12/2019 6:53 AM

## 2019-03-13 LAB — CBC
HCT: 27.4 % — ABNORMAL LOW (ref 36.0–46.0)
Hemoglobin: 8.5 g/dL — ABNORMAL LOW (ref 12.0–15.0)
MCH: 30.7 pg (ref 26.0–34.0)
MCHC: 31 g/dL (ref 30.0–36.0)
MCV: 98.9 fL (ref 80.0–100.0)
Platelets: 262 10*3/uL (ref 150–400)
RBC: 2.77 MIL/uL — ABNORMAL LOW (ref 3.87–5.11)
RDW: 22.5 % — ABNORMAL HIGH (ref 11.5–15.5)
WBC: 8.9 10*3/uL (ref 4.0–10.5)
nRBC: 0.6 % — ABNORMAL HIGH (ref 0.0–0.2)

## 2019-03-13 LAB — GLUCOSE, CAPILLARY
Glucose-Capillary: 106 mg/dL — ABNORMAL HIGH (ref 70–99)
Glucose-Capillary: 117 mg/dL — ABNORMAL HIGH (ref 70–99)
Glucose-Capillary: 164 mg/dL — ABNORMAL HIGH (ref 70–99)
Glucose-Capillary: 247 mg/dL — ABNORMAL HIGH (ref 70–99)
Glucose-Capillary: 266 mg/dL — ABNORMAL HIGH (ref 70–99)

## 2019-03-13 LAB — PROTIME-INR
INR: 1.4 — ABNORMAL HIGH (ref 0.8–1.2)
Prothrombin Time: 16.7 seconds — ABNORMAL HIGH (ref 11.4–15.2)

## 2019-03-13 LAB — HEPARIN LEVEL (UNFRACTIONATED): Heparin Unfractionated: 0.64 IU/mL (ref 0.30–0.70)

## 2019-03-13 MED ORDER — APIXABAN 5 MG PO TABS: 10.0000 mg | ORAL_TABLET | Freq: Two times a day (BID) | ORAL | Status: DC

## 2019-03-13 MED ORDER — WARFARIN SODIUM 5 MG PO TABS
5.0000 mg | ORAL_TABLET | Freq: Once | ORAL | Status: DC
  Filled 2019-03-13: qty 1

## 2019-03-13 MED ORDER — APIXABAN 5 MG PO TABS: 5.0000 mg | ORAL_TABLET | Freq: Two times a day (BID) | ORAL | Status: DC

## 2019-03-13 NOTE — Consult Note (Signed)
ANTICOAGULATION CONSULT NOTE  Pharmacy Consult for heparin Indication: DVT  Patient Measurements: Height: 5' 4.02" (162.6 cm) Weight: 192 lb 7.4 oz (87.3 kg) IBW/kg (Calculated) : 54.74 Heparin Dosing Weight: 71 kg  Vital Signs: Temp: 99.8 F (37.7 C) (01/08 0546) Temp Source: Oral (01/08 0546) BP: 143/63 (01/08 0546) Pulse Rate: 84 (01/08 0546)  Labs: Recent Labs    03/11/19 0502 03/12/19 0536 03/13/19 0618  HGB 7.2* 7.8* 8.5*  HCT 22.6* 24.7* 27.4*  PLT 245 279 262  LABPROT 14.6 15.2 16.7*  INR 1.2 1.2 1.4*  HEPARINUNFRC 0.34 0.54 0.64  CREATININE 2.97*  --   --    Estimated Creatinine Clearance: 20.2 mL/min (A) (by C-G formula based on SCr of 2.97 mg/dL (H)).  Medical History: Past Medical History:  Diagnosis Date  . Anemia   . Chronic diastolic CHF (congestive heart failure) ([place])    a. Due to ischemic cardiomyopathy. EF as low as 35%, improved to normal s/p CABG; b. echo 07/06/13: EF 55-60%, no RWMAs, mod TR, trivial pericardial effusion not c/w tamponade physiology;  c. 10/2015 Echo: EF 65%, Gr1 DD, triv AI, mild MR, mildly dil LA, mod TR, PASP 55mmHg.  Marland Kitchen Coronary artery disease    a. NSTEMI 06/2013; b.cath: severe three-vessel CAD w/ EF 30% & mild-mod MR; c. s/p 3 vessel CABG 07/02/13 (LIMA-LAD, SVG-OM, and SVG-RPDA);  d. 10/2015 MV: no ischemia/infarct.  . Diabetes mellitus without complication ([place])   . Diabetic neuropathy ([place])   . Dialysis patient [facility])    MWF  . ESRD (end stage renal disease) ([place])    a. 12/2015 initiated - mwf dialysis.  Marland Kitchen GERD (gastroesophageal reflux disease)   . Hyperlipidemia   . Hypertension   . Hypothyroidism   . Myocardial infarction ([place]) 2015  . Neuropathy   . Pleural effusion 2015  . Pulmonary hypertension ([place])   . Renal insufficiency   . Wears dentures    full lower    Medications:  Scheduled:  . aspirin  81 mg Oral Daily  . B-complex with vitamin C  1 tablet Per Tube Daily  . Chlorhexidine Gluconate Cloth  6 each  Topical Q0600  . epoetin (EPOGEN/PROCRIT) injection  4,000 Units Intravenous Q M,W,F-HD  . feeding supplement (PRO-STAT SUGAR FREE 64)  30 mL Per Tube Daily  . fluticasone  2 spray Each Nare Daily  . free water  20 mL Per Tube Q4H  . insulin aspart  0-15 Units Subcutaneous Q4H  . levothyroxine  88 mcg Oral QAC breakfast  . mouth rinse  15 mL Mouth Rinse BID  . midodrine  10 mg Oral TID WC  . pantoprazole (PROTONIX) IV  40 mg Intravenous Q12H  . psyllium  1 packet Per Tube BID  . venlafaxine  75 mg Oral BID WC  . Warfarin - Pharmacist Dosing Inpatient   Does not apply q1800  . zinc sulfate  220 mg Oral Daily    Assessment: 65 y.o. female admitted on 02/09/2019 with MRSA  bacteremia. She was on apixaban PTA but last dose was prior to admission (12/7). [place] 12/15 revealed age-indeterminate short segment occlusive DVT involving the mid humeral aspect of one of the paired brachial veins.   Additionally, she was too confused and disoriented to swallow oral medications. On 12/20 a femoral central line was placed.    Goal of Therapy:  Heparin Level: 0.3 - 0.7 units/mL Monitor platelets by anticoagulation protocol: Yes   Plan:  1/8 0618 HL 0.64, therapeutic. Continue heparin  drip at 1200 units/hr. Heparin should ideally continue for a minimum of a 5-day overlap with warfarin AND until INR therapeutic x 2 days. Will continue to follow daily HL and CBC while on heparin drip.  Dorena Bodo, PharmD Clinical Pharmacist 03/13/2019 8:08 AM

## 2019-03-13 NOTE — Progress Notes (Signed)
This note also relates to the following rows which could not be included: Pulse Rate - Cannot attach notes to unvalidated device data Resp - Cannot attach notes to unvalidated device data BP - Cannot attach notes to unvalidated device data  Hd completed  

## 2019-03-13 NOTE — Progress Notes (Signed)
MD called after RN was assisting in rolling the pt that pt had copious amounts of green liquid leaking from her vagina. Doctor order a wet prep. RN to collect.

## 2019-03-13 NOTE — Consult Note (Addendum)
ANTICOAGULATION CONSULT NOTE  Pharmacy Consult for apixaban Indication: DVT  Patient Measurements: Height: 5' 4.02" (162.6 cm) Weight: 192 lb 7.4 oz (87.3 kg) IBW/kg (Calculated) : 54.74 Heparin Dosing Weight: 71 kg  Vital Signs: Temp: 99.8 F (37.7 C) (01/08 0546) Temp Source: Oral (01/08 0546) BP: 143/63 (01/08 0546) Pulse Rate: 84 (01/08 0546)  Labs: Recent Labs    03/11/19 0502 03/12/19 0536 03/13/19 0618  HGB 7.2* 7.8* 8.5*  HCT 22.6* 24.7* 27.4*  PLT 245 279 262  LABPROT 14.6 15.2 16.7*  INR 1.2 1.2 1.4*  HEPARINUNFRC 0.34 0.54 0.64  CREATININE 2.97*  --   --    Estimated Creatinine Clearance: 20.2 mL/min (A) (by C-G formula based on SCr of 2.97 mg/dL (H)).  Medical History: Past Medical History:  Diagnosis Date  . Anemia   . Chronic diastolic CHF (congestive heart failure) ([place])    a. Due to ischemic cardiomyopathy. EF as low as 35%, improved to normal s/p CABG; b. echo 07/06/13: EF 55-60%, no RWMAs, mod TR, trivial pericardial effusion not c/w tamponade physiology;  c. 10/2015 Echo: EF 65%, Gr1 DD, triv AI, mild MR, mildly dil LA, mod TR, PASP 61mmHg.  Marland Kitchen Coronary artery disease    a. NSTEMI 06/2013; b.cath: severe three-vessel CAD w/ EF 30% & mild-mod MR; c. s/p 3 vessel CABG 07/02/13 (LIMA-LAD, SVG-OM, and SVG-RPDA);  d. 10/2015 MV: no ischemia/infarct.  . Diabetes mellitus without complication ([place])   . Diabetic neuropathy ([place])   . Dialysis patient [facility])    MWF  . ESRD (end stage renal disease) ([place])    a. 12/2015 initiated - mwf dialysis.  Marland Kitchen GERD (gastroesophageal reflux disease)   . Hyperlipidemia   . Hypertension   . Hypothyroidism   . Myocardial infarction ([place]) 2015  . Neuropathy   . Pleural effusion 2015  . Pulmonary hypertension ([place])   . Renal insufficiency   . Wears dentures    full lower    Medications:  Scheduled:  . aspirin  81 mg Oral Daily  . B-complex with vitamin C  1 tablet Per Tube Daily  . Chlorhexidine Gluconate Cloth  6 each  Topical Q0600  . epoetin (EPOGEN/PROCRIT) injection  4,000 Units Intravenous Q M,W,F-HD  . feeding supplement (PRO-STAT SUGAR FREE 64)  30 mL Per Tube Daily  . fluticasone  2 spray Each Nare Daily  . free water  20 mL Per Tube Q4H  . insulin aspart  0-15 Units Subcutaneous Q4H  . levothyroxine  88 mcg Oral QAC breakfast  . mouth rinse  15 mL Mouth Rinse BID  . midodrine  10 mg Oral TID WC  . pantoprazole (PROTONIX) IV  40 mg Intravenous Q12H  . psyllium  1 packet Per Tube BID  . venlafaxine  75 mg Oral BID WC  . Warfarin - Pharmacist Dosing Inpatient   Does not apply q1800  . zinc sulfate  220 mg Oral Daily    Assessment: 64 y.o. female admitted on 02/09/2019 with MRSA  bacteremia. She was on apixaban PTA but last dose was prior to admission (12/7). [place] 12/15 revealed age-indeterminate short segment occlusive DVT involving the mid humeral aspect of one of the paired brachial veins. On 12/20 a femoral central line was placed.  Nurse reported bleeding around femoral line 1/8. Per hospitalist, stop heparin and warfarin and start Eliquis treatment 1/9.   Goal of Therapy:  INR: 2-3 Monitor platelets by anticoagulation protocol: Yes   Plan:  Eliquis 10 mg BID x 7  days followed by 5 mg BID.  Dorena Bodo, PharmD 03/13/2019 8:10 AM

## 2019-03-13 NOTE — Progress Notes (Signed)
RN noted femoral cath to be bleeding thru dressing. Dressing and biopatch was changed. Visualized by Dr. Holley Raring and Dr. Reesa Chew notified. Pts heparin and coumadin ordered to be stopped.

## 2019-03-13 NOTE — Progress Notes (Signed)
PROGRESS NOTE    Jamie Arias  [id number] DOB: 1955-11-13 DOA: 02/09/2019 PCP: [name], MD   Brief Narrative:  Jamie Arias is a 64 y.o. white female with end-stage renal disease on hemodialysis, atrial fibrillation, chronic diastolic congestive heart failure, coronary artery disease status post CABG, diabetes mellitus type II, hypertension, hyperlipidemia, hypothyroidism, Covid positive in July 2020, recent nursing home stay in [place] Prolonged hospitalization for MRSA bacteremia/sepsis and now with acute encephalopathy.  Subjective: Patient just opened her eyes when calling her name.  Not following any commands.  Nursing concern of bleeding from left groin catheter site.  Assessment & Plan:   Principal Problem:   Sepsis due to methicillin resistant Staphylococcus aureus (MRSA) ([place]) Active Problems:   Type 2 diabetes mellitus with ESRD (end-stage renal disease) (H[facility])   Atrial fibrillation, chronic (HCC)   Hypotension   Chronic diastolic CHF (congestive heart failure) (H[facility])   ESRD (end stage renal disease) (H[facility])   Altered level of consciousness   Decubitus ulcer of heel, bilateral, stage 2 (HCC)   MRSA bacteremia   Arm DVT (deep venous thromboembolism), acute, left (H[facility])   Acute encephalopathy   Acute metabolic encephalopathy   Acute blood loss anemia   AF (paroxysmal atrial fibrillation) ([place])   Diarrhea   Oropharyngeal dysphagia   Abscess  Sepsis secondary to MRSA bacteremia.  No obvious source.  Right arm graft was removed on 03/04/2019. -Continue with vancomycin for 6-week.  Bleeding from catheter site with multiple ecchymosis.  And has multiple ecchymosis with blood-filled vesicles on all extremities and chest. Active small amount of bleeding from left groin catheter. -Concern for warfarin side effect. -Discontinue heparin and warfarin. -We will start her on Eliquis renally dosed for DVT.  Hypotension.  Patient initially managed with  Levophed. Blood pressure maintained on midodrine. -Continue midodrine  ESRD ([facility]).  Patient received her dialysis today.  Currently using tunneled catheter as graft was removed due to MRSA bacteremia. -Continue with scheduled dialysis.  Acute metabolic encephalopathy.  Mental status still impaired.  MRI of brain and EEG negative.  TSH normal. Overall prognosis seems poor.  Left arm DVT.  Patient was on Heparin.  Recent diagnosis of left arm DVT on 03/03/2019. -Discontinue heparin and Coumadin due to concern for Coumadin toxicity/side effect due to her skin necrotic lesions. -Start her on renally dosed Eliquis from tomorrow.  Hypothyroidism. -Continue with Synthroid.  Paroxysmal A. Fib.. Currently in sinus. -Continue amiodarone. -Continue anticoagulation with Coumadin.  Type 2 diabetes. -Continue SSI.  Chronic diastolic heart failure.  Volume being managed by dialysis.  Bilateral decubitus ulcers.  Present on admission. -Continue with wound care.  Nutrition.  PEG placed 03/03/2019.  Continue tube feeding.  Acute hypoxic respiratory failure.  Resolved.  Patient was intubated initially, extubated on 03/05/2019.  Objective: Vitals:   03/13/19 1430 03/13/19 1440 03/13/19 1456 03/13/19 1541  BP: 113/63 132/63  (!) 136/47  Pulse: 86 82 86 86  Resp: (!) 26 15 18 18   Temp:  98.5 F (36.9 C)  98.4 F (36.9 C)  TempSrc:  Axillary  Oral  SpO2:    94%  Weight:      Height:        Intake/Output Summary (Last 24 hours) at 03/13/2019 1817 Last data filed at 03/13/2019 1440 Gross per 24 hour  Intake 1499.46 ml  Output 1600 ml  Net -100.54 ml   Filed Weights   03/02/19 0930 03/06/19 1500 03/10/19 1220  Weight: 75.5 kg  83 kg 87.3 kg    Examination:  General exam: Chronically ill-appearing lady, minimal response to painful stimuli, in no acute distress. Respiratory system: Clear to auscultation. Respiratory effort normal. Cardiovascular system: S1 & S2 heard, RRR. No JVD,  murmurs, rubs, gallops or clicks. Gastrointestinal system: Soft, nontender, nondistended, bowel sounds positive. Central nervous system: Unable to assess as patient is minimally responsive and not following any commands. Extremities: No edema, no cyanosis, pulses intact and symmetrical. Skin: Decubitus ulcers on both heels.  Multiple ecchymosis with blood-filled vesicles on all extremities and upper chest. Psychiatry: Judgement and insight appear impaired.  DVT prophylaxis: Eliquis Code Status: DNR Family Communication: No family at bedside. Disposition Plan: Waiting for bed offer at [facility].  Consultants:   Nephrology  Infectious disease  Vascular surgery  Neurology  Critical care specialist   Procedures:  Antimicrobials:  Vancomycin.  Data Reviewed: I have personally reviewed following labs and imaging studies  CBC: Recent Labs  Lab 03/09/19 0528 03/09/19 1515 03/10/19 0600 03/11/19 0502 03/12/19 0536 03/13/19 0618  WBC 9.1  --  9.1 9.8 10.0 8.9  HGB 7.4* 8.0* 7.4* 7.2* 7.8* 8.5*  HCT 23.7* 24.8* 23.9* 22.6* 24.7* 27.4*  MCV 94.0  --  96.4 97.0 98.0 98.9  PLT 233  --  261 245 279 [id number]   Basic Metabolic Panel: Recent Labs  Lab 03/07/19 0423 03/08/19 0700 03/09/19 0528 03/11/19 0502  NA 136 135 134* 137  K 3.7 3.7 3.8 3.4*  CL 99 100 101 102  CO2 26 26 23 29   GLUCOSE 191* 122* 141* 151*  BUN 19 30* 39* 33*  CREATININE 1.88* 2.70* 3.29* 2.97*  CALCIUM 8.2* 8.1* 8.4* 8.4*  MG  --  2.2 2.2 2.1   GFR: Estimated Creatinine Clearance: 20.2 mL/min (A) (by C-G formula based on SCr of 2.97 mg/dL (H)). Liver Function Tests: Recent Labs  Lab 03/09/19 0528 03/11/19 0502  AST 18 17  ALT 7 7  ALKPHOS 68 74  BILITOT 0.9 0.7  PROT 4.9* 5.0*  ALBUMIN 1.9* 1.8*   No results for input(s): LIPASE, AMYLASE in the last 168 hours. No results for input(s): AMMONIA in the last 168 hours. Coagulation Profile: Recent Labs  Lab 03/09/19 0528 03/11/19 0502  03/12/19 0536 03/13/19 0618  INR 1.1 1.2 1.2 1.4*   Cardiac Enzymes: No results for input(s): CKTOTAL, CKMB, CKMBINDEX, TROPONINI in the last 168 hours. BNP (last 3 results) No results for input(s): PROBNP in the last 8760 hours. HbA1C: No results for input(s): HGBA1C in the last 72 hours. CBG: Recent Labs  Lab 03/12/19 2012 03/13/19 0008 03/13/19 0547 03/13/19 0752 03/13/19 1538  GLUCAP 166* 164* 266* 247* 106*   Lipid Profile: No results for input(s): CHOL, HDL, LDLCALC, TRIG, CHOLHDL, LDLDIRECT in the last 72 hours. Thyroid Function Tests: No results for input(s): TSH, T4TOTAL, FREET4, T3FREE, THYROIDAB in the last 72 hours. Anemia Panel: No results for input(s): VITAMINB12, FOLATE, FERRITIN, TIBC, IRON, RETICCTPCT in the last 72 hours. Sepsis Labs: No results for input(s): PROCALCITON, LATICACIDVEN in the last 168 hours.  Recent Results (from the past 240 hour(s))  Aerobic/Anaerobic Culture (surgical/deep wound)     Status: None   Collection Time: 03/04/19 11:04 AM   Specimen: PATH Other; Tissue  Result Value Ref Range Status   Specimen Description   Final    ARTERY Performed at [facility], [address], [place], [place] [address]    Special Requests   Final    RIGHT AV GRAFT  AND STENTS CULTURE Performed at [facility], [address] [place], [place] [address]    Gram Stain   Final    RARE WBC PRESENT, PREDOMINANTLY PMN NO ORGANISMS SEEN    Culture   Final    No growth aerobically or anaerobically. Performed at [facility], [place] [address], [place], [place] [address]    Report Status 03/09/2019 FINAL  Final  Aerobic/Anaerobic Culture (surgical/deep wound)     Status: None   Collection Time: 03/04/19 11:29 AM   Specimen: PATH Other; Tissue  Result Value Ref Range Status   Specimen Description   Final    ARTERY Performed at [facility], [address] [place], [place] [address]    Special Requests   Final     FLUID AROUND AV GRAFT Performed at [facility], [address] [place], [place] [address]    Gram Stain   Final    FEW WBC PRESENT, PREDOMINANTLY PMN NO ORGANISMS SEEN    Culture   Final    No growth aerobically or anaerobically. Performed at [facility], [place] [address], [place], [place] [address]    Report Status 03/09/2019 FINAL  Final     Radiology Studies: No results found.  Scheduled Meds: . [START ON 03/14/2019] apixaban  10 mg Oral BID   Followed by  . [START ON 03/21/2019] apixaban  5 mg Oral BID  . aspirin  81 mg Oral Daily  . B-complex with vitamin C  1 tablet Per Tube Daily  . Chlorhexidine Gluconate Cloth  6 each Topical Q0600  . epoetin (EPOGEN/PROCRIT) injection  4,000 Units Intravenous Q M,W,F-HD  . feeding supplement (PRO-STAT SUGAR FREE 64)  30 mL Per Tube Daily  . fluticasone  2 spray Each Nare Daily  . free water  20 mL Per Tube Q4H  . insulin aspart  0-15 Units Subcutaneous Q4H  . levothyroxine  88 mcg Oral QAC breakfast  . mouth rinse  15 mL Mouth Rinse BID  . midodrine  10 mg Oral TID WC  . pantoprazole (PROTONIX) IV  40 mg Intravenous Q12H  . psyllium  1 packet Per Tube BID  . venlafaxine  75 mg Oral BID WC  . zinc sulfate  220 mg Oral Daily   Continuous Infusions: . sodium chloride Stopped (03/01/19 0429)  . feeding supplement (OSMOLITE 1.5 CAL) 1,000 mL (03/12/19 0947)  . vancomycin 750 mg (03/11/19 1358)     LOS: 32 days   Time spent: 35 min.   Lorella Nimrod, MD [facility] Pager [phone]  If 7PM-7AM, please contact night-coverage [email or web address] Password [facility] 03/13/2019, 6:17 PM   This record has been created using Dragon voice recognition software. Errors have been sought and corrected,but may not always be located. Such creation errors do not reflect on the standard of care.

## 2019-03-13 NOTE — Progress Notes (Signed)
[facility]  ROUNDING NOTE   Subjective:  Overall no significant change in status. Overall remains quite weak and lethargic.    Objective:  Vital signs in last 24 hours:  Temp:  [99 F (37.2 C)-99.8 F (37.7 C)] 99.8 F (37.7 C) (01/08 0546) Pulse Rate:  [80-88] 81 (01/08 1215) Resp:  [16-23] 22 (01/08 1215) BP: (81-167)/(48-79) 103/79 (01/08 1215) SpO2:  [98 %-99 %] 98 % (01/08 0546)  Weight change:  Filed Weights   03/02/19 0930 03/06/19 1500 03/10/19 1220  Weight: 75.5 kg 83 kg 87.3 kg    Intake/Output: I/O last 3 completed shifts: In: 2734.5 [I.V.:380.5; GR:[id number]; NG/GT:144] Out: 1200 [Stool:1200]   Intake/Output this shift:  No intake/output data recorded.  Physical Exam: General: NAD, laying in bed  HEENT [place]/AT OM moist  Lungs:  Scatterd rhonchi, normal effort  Heart: Regular rate and rhythm  Abdomen:  Soft, nontender, PEG tube in place  Extremities: 2+ upper and lower extremity edema  Neurologic: Lethargic but arousable  Access: Left IJ PC  Rectal rube in place   Basic Metabolic Panel: Recent Labs  Lab 03/07/19 0423 03/08/19 0700 03/09/19 0528 03/11/19 0502  NA 136 135 134* 137  K 3.7 3.7 3.8 3.4*  CL 99 100 101 102  CO2 26 26 23 29   GLUCOSE 191* 122* 141* 151*  BUN 19 30* 39* 33*  CREATININE 1.88* 2.70* 3.29* 2.97*  CALCIUM 8.2* 8.1* 8.4* 8.4*  MG  --  2.2 2.2 2.1    Liver Function Tests: Recent Labs  Lab 03/09/19 0528 03/11/19 0502  AST 18 17  ALT 7 7  ALKPHOS 68 74  BILITOT 0.9 0.7  PROT 4.9* 5.0*  ALBUMIN 1.9* 1.8*   No results for input(s): LIPASE, AMYLASE in the last 168 hours. No results for input(s): AMMONIA in the last 168 hours.  CBC: Recent Labs  Lab 03/09/19 0528 03/09/19 1515 03/10/19 0600 03/11/19 0502 03/12/19 0536 03/13/19 0618  WBC 9.1  --  9.1 9.8 10.0 8.9  HGB 7.4* 8.0* 7.4* 7.2* 7.8* 8.5*  HCT 23.7* 24.8* 23.9* 22.6* 24.7* 27.4*  MCV 94.0  --  96.4 97.0 98.0 98.9  PLT 233  --  261  245 279 262    Cardiac Enzymes: No results for input(s): CKTOTAL, CKMB, CKMBINDEX, TROPONINI in the last 168 hours.  BNP: Invalid input(s): POCBNP  CBG: Recent Labs  Lab 03/12/19 1604 03/12/19 2012 03/13/19 0008 03/13/19 0547 03/13/19 0752  GLUCAP 156* 166* 164* 266* 247*    Microbiology: Results for orders placed or performed during the hospital encounter of 02/09/19  Culture, blood (routine x 2)     Status: Abnormal   Collection Time: 02/09/19  2:29 PM   Specimen: BLOOD LEFT ARM  Result Value Ref Range Status   Specimen Description   Final    BLOOD LEFT ARM Performed at [facility], [address] [place], [place] [address]    Special Requests   Final    BOTTLES DRAWN AEROBIC AND ANAEROBIC Blood Culture adequate volume Performed at [facility], [address], [place], [place] [address]    Culture  Setup Time   Final    GRAM POSITIVE COCCI IN BOTH AEROBIC AND ANAEROBIC BOTTLES CRITICAL VALUE NOTED.  VALUE IS CONSISTENT WITH PREVIOUSLY REPORTED AND CALLED VALUE. Performed at [facility], [address] [place], [place] [address]    Culture (A)  Final    STAPHYLOCOCCUS AUREUS SUSCEPTIBILITIES PERFORMED ON PREVIOUS CULTURE WITHIN THE LAST  5 DAYS. Performed at [facility], [place] [address] [place], [place] [address]    Report Status 02/12/2019 FINAL  Final  Culture, blood (routine x 2)     Status: Abnormal   Collection Time: 02/09/19  2:38 PM   Specimen: BLOOD LEFT ARM  Result Value Ref Range Status   Specimen Description   Final    BLOOD LEFT ARM Performed at [facility], [address] [place], [place] [address]    Special Requests   Final    BOTTLES DRAWN AEROBIC AND ANAEROBIC Blood Culture adequate volume Performed at [facility], [address], [place], [place] [address]    Culture  Setup Time   Final    GRAM POSITIVE COCCI IN BOTH AEROBIC AND ANAEROBIC BOTTLES CRITICAL RESULT CALLED TO,  READ BACK BY AND VERIFIED WITH: [place] [id number] 02/10/2019 HNM Performed at [facility], [place] [address], [place], [place] [address]    Culture METHICILLIN RESISTANT STAPHYLOCOCCUS AUREUS (A)  Final   Report Status 02/12/2019 FINAL  Final   Organism ID, Bacteria METHICILLIN RESISTANT STAPHYLOCOCCUS AUREUS  Final      Susceptibility   Methicillin resistant staphylococcus aureus - MIC*    CIPROFLOXACIN >=8 RESISTANT Resistant     ERYTHROMYCIN >=8 RESISTANT Resistant     GENTAMICIN <=0.5 SENSITIVE Sensitive     OXACILLIN >=4 RESISTANT Resistant     TETRACYCLINE <=1 SENSITIVE Sensitive     VANCOMYCIN 1 SENSITIVE Sensitive     TRIMETH/SULFA <=10 SENSITIVE Sensitive     CLINDAMYCIN RESISTANT Resistant     RIFAMPIN <=0.5 SENSITIVE Sensitive     Inducible Clindamycin POSITIVE Resistant     * METHICILLIN RESISTANT STAPHYLOCOCCUS AUREUS  Blood Culture ID Panel (Reflexed)     Status: Abnormal   Collection Time: 02/09/19  2:38 PM  Result Value Ref Range Status   Enterococcus species NOT DETECTED NOT DETECTED Final   Listeria monocytogenes NOT DETECTED NOT DETECTED Final   Staphylococcus species DETECTED (A) NOT DETECTED Final    Comment: CRITICAL RESULT CALLED TO, READ BACK BY AND VERIFIED WITH: Hart Robinsons PHARMD [id number] 02/10/2019 HNM    Staphylococcus aureus (BCID) DETECTED (A) NOT DETECTED Final    Comment: Methicillin (oxacillin)-resistant Staphylococcus aureus (MRSA). MRSA is predictably resistant to beta-lactam antibiotics (except ceftaroline). Preferred therapy is vancomycin unless clinically contraindicated. Patient requires contact precautions if  hospitalized. CRITICAL RESULT CALLED TO, READ BACK BY AND VERIFIED WITH: Hart Robinsons PHARMD [id number] 02/10/2019 HNM    Methicillin resistance DETECTED (A) NOT DETECTED Final    Comment: CRITICAL RESULT CALLED TO, READ BACK BY AND VERIFIED WITH: Hart Robinsons PHARMD [id number] 02/10/2019 HNM    Streptococcus species NOT DETECTED NOT DETECTED Final    Streptococcus agalactiae NOT DETECTED NOT DETECTED Final   Streptococcus pneumoniae NOT DETECTED NOT DETECTED Final   Streptococcus pyogenes NOT DETECTED NOT DETECTED Final   Acinetobacter baumannii NOT DETECTED NOT DETECTED Final   Enterobacteriaceae species NOT DETECTED NOT DETECTED Final   Enterobacter cloacae complex NOT DETECTED NOT DETECTED Final   Escherichia coli NOT DETECTED NOT DETECTED Final   Klebsiella oxytoca NOT DETECTED NOT DETECTED Final   Klebsiella pneumoniae NOT DETECTED NOT DETECTED Final   Proteus species NOT DETECTED NOT DETECTED Final   Serratia marcescens NOT DETECTED NOT DETECTED Final   Haemophilus influenzae NOT DETECTED NOT DETECTED Final   Neisseria meningitidis NOT DETECTED NOT DETECTED Final   Pseudomonas aeruginosa NOT DETECTED NOT DETECTED Final   Candida albicans  NOT DETECTED NOT DETECTED Final   Candida glabrata NOT DETECTED NOT DETECTED Final   Candida krusei NOT DETECTED NOT DETECTED Final   Candida parapsilosis NOT DETECTED NOT DETECTED Final   Candida tropicalis NOT DETECTED NOT DETECTED Final    Comment: Performed at [facility], [place], [place] [address]  SARS CORONAVIRUS 2 (TAT 6-24 HRS) Nasopharyngeal Nasopharyngeal Swab     Status: None   Collection Time: 02/09/19  2:54 PM   Specimen: Nasopharyngeal Swab  Result Value Ref Range Status   SARS Coronavirus 2 NEGATIVE NEGATIVE Final    Comment: (NOTE) SARS-CoV-2 target nucleic acids are NOT DETECTED. The SARS-CoV-2 RNA is generally detectable in upper and lower respiratory specimens during the acute phase of infection. Negative results do not preclude SARS-CoV-2 infection, do not rule out co-infections with other pathogens, and should not be used as the sole basis for treatment or other patient management decisions. Negative results must be combined with clinical observations, patient history, and epidemiological information. The expected result is  Negative. Fact Sheet for Patients: SugarRoll.be Fact Sheet for Healthcare Providers: https://www.woods-mathews.com/ This test is not yet approved or cleared by the [place] FDA and  has been authorized for detection and/or diagnosis of SARS-CoV-2 by FDA under an Emergency Use Authorization (EUA). This EUA will remain  in effect (meaning this test can be used) for the duration of the COVID-19 declaration under Section 56 4(b)(1) of the Act, 21 U.S.C. section 360bbb-3(b)(1), unless the authorization is terminated or revoked sooner. Performed at [facility], [place] [address] [place], [place] [address]   MRSA PCR Screening     Status: Abnormal   Collection Time: 02/10/19  5:03 PM   Specimen: Nasopharyngeal  Result Value Ref Range Status   MRSA by PCR POSITIVE (A) NEGATIVE Final    Comment:        The GeneXpert MRSA Assay (FDA approved for NASAL specimens only), is one component of a comprehensive MRSA colonization surveillance program. It is not intended to diagnose MRSA infection nor to guide or monitor treatment for MRSA infections. RESULT CALLED TO, READ BACK BY AND VERIFIED WITH: ALEKSEY FRASER @1839  02/10/19 MJU Performed at [facility], [address] [place], [place] [address]   Culture, blood (routine x 2)     Status: Abnormal   Collection Time: 02/11/19 12:31 PM   Specimen: BLOOD  Result Value Ref Range Status   Specimen Description   Final    BLOOD PORTA CATH Performed at [place] [address] [place], [place] [address]    Special Requests   Final    BOTTLES DRAWN AEROBIC AND ANAEROBIC Blood Culture adequate volume Performed at [facility], [address] [place], [place] [address]    Culture  Setup Time   Final    GRAM POSITIVE COCCI ANAEROBIC BOTTLE ONLY CRITICAL RESULT CALLED TO, READ BACK BY AND VERIFIED WITH: SCOTT HALL AT [id number] 02/12/2019 [facility]  Performed at [facility], [address] [place], [place] [address]    Culture (A)  Final    STAPHYLOCOCCUS AUREUS SUSCEPTIBILITIES PERFORMED ON PREVIOUS CULTURE WITHIN THE LAST 5 DAYS. Performed at [facility], [place] [address], [place], [place] [address]    Report Status 02/13/2019 FINAL  Final  Culture, blood (routine x 2)     Status: None   Collection Time: 02/11/19  3:57 PM   Specimen: BLOOD  Result Value Ref Range Status  Specimen Description BLOOD PORTA CATH  Final   Special Requests   Final    BOTTLES DRAWN AEROBIC AND ANAEROBIC Blood Culture adequate volume   Culture   Final    NO GROWTH 5 DAYS Performed at [facility], [address] [place], [place] [address]    Report Status 02/16/2019 FINAL  Final  CULTURE, BLOOD (ROUTINE X 2) w Reflex to ID Panel     Status: Abnormal   Collection Time: 02/13/19  2:18 PM   Specimen: BLOOD LEFT HAND  Result Value Ref Range Status   Specimen Description BLOOD LEFT HAND  Final   Special Requests   Final    BOTTLES DRAWN AEROBIC AND ANAEROBIC Blood Culture results may not be optimal due to an inadequate volume of blood received in culture bottles   Culture  Setup Time   Final    AEROBIC BOTTLE ONLY GRAM POSITIVE COCCI IN CLUSTERS CRITICAL RESULT CALLED TO, READ BACK BY AND VERIFIED WITH: Herbert Pun AT [id number] ON 02/14/2019 [place]. Performed at [facility], [place] [address], [place], [place] [address]    Culture METHICILLIN RESISTANT STAPHYLOCOCCUS AUREUS (A)  Final   Report Status 02/16/2019 FINAL  Final   Organism ID, Bacteria METHICILLIN RESISTANT STAPHYLOCOCCUS AUREUS  Final      Susceptibility   Methicillin resistant staphylococcus aureus - MIC*    CIPROFLOXACIN >=8 RESISTANT Resistant     ERYTHROMYCIN >=8 RESISTANT Resistant     GENTAMICIN <=0.5 SENSITIVE Sensitive     OXACILLIN >=4 RESISTANT Resistant     TETRACYCLINE <=1 SENSITIVE Sensitive     VANCOMYCIN <=0.5 SENSITIVE Sensitive     TRIMETH/SULFA <=10  SENSITIVE Sensitive     CLINDAMYCIN RESISTANT Resistant     RIFAMPIN <=0.5 SENSITIVE Sensitive     Inducible Clindamycin POSITIVE Resistant     * METHICILLIN RESISTANT STAPHYLOCOCCUS AUREUS  Blood Culture ID Panel (Reflexed)     Status: Abnormal   Collection Time: 02/13/19  2:18 PM  Result Value Ref Range Status   Enterococcus species NOT DETECTED NOT DETECTED Final   Listeria monocytogenes NOT DETECTED NOT DETECTED Final   Staphylococcus species DETECTED (A) NOT DETECTED Final    Comment: CRITICAL RESULT CALLED TO, READ BACK BY AND VERIFIED WITH: ABBEY ELLINGTON AT [id number] ON 02/14/2019 [place].    Staphylococcus aureus (BCID) DETECTED (A) NOT DETECTED Final    Comment: Methicillin (oxacillin)-resistant Staphylococcus aureus (MRSA). MRSA is predictably resistant to beta-lactam antibiotics (except ceftaroline). Preferred therapy is vancomycin unless clinically contraindicated. Patient requires contact precautions if  hospitalized. CRITICAL RESULT CALLED TO, READ BACK BY AND VERIFIED WITH: Herbert Pun AT [id number] ON 02/14/2019 [name].    Methicillin resistance DETECTED (A) NOT DETECTED Final    Comment: CRITICAL RESULT CALLED TO, READ BACK BY AND VERIFIED WITH: Herbert Pun AT [id number] ON 02/14/2019 [place].    Streptococcus species NOT DETECTED NOT DETECTED Final   Streptococcus agalactiae NOT DETECTED NOT DETECTED Final   Streptococcus pneumoniae NOT DETECTED NOT DETECTED Final   Streptococcus pyogenes NOT DETECTED NOT DETECTED Final   Acinetobacter baumannii NOT DETECTED NOT DETECTED Final   Enterobacteriaceae species NOT DETECTED NOT DETECTED Final   Enterobacter cloacae complex NOT DETECTED NOT DETECTED Final   Escherichia coli NOT DETECTED NOT DETECTED Final   Klebsiella oxytoca NOT DETECTED NOT DETECTED Final   Klebsiella pneumoniae NOT DETECTED NOT DETECTED Final   Proteus species NOT DETECTED NOT DETECTED Final   Serratia marcescens NOT DETECTED NOT DETECTED Final   Haemophilus  influenzae NOT DETECTED NOT DETECTED Final   Neisseria meningitidis NOT DETECTED NOT DETECTED Final   Pseudomonas aeruginosa NOT DETECTED NOT DETECTED Final   Candida albicans NOT DETECTED NOT DETECTED Final   Candida glabrata NOT DETECTED NOT DETECTED Final   Candida krusei NOT DETECTED NOT DETECTED Final   Candida parapsilosis NOT DETECTED NOT DETECTED Final   Candida tropicalis NOT DETECTED NOT DETECTED Final    Comment: Performed at [facility], [address] [place], [place] [address]  CULTURE, BLOOD (ROUTINE X 2) w Reflex to ID Panel     Status: None   Collection Time: 02/13/19  3:09 PM   Specimen: BLOOD  Result Value Ref Range Status   Specimen Description BLOOD BLOOD LEFT HAND  Final   Special Requests   Final    BOTTLES DRAWN AEROBIC AND ANAEROBIC Blood Culture adequate volume   Culture   Final    NO GROWTH 5 DAYS Performed at [facility], [address] [place], [place] [address]    Report Status 02/18/2019 FINAL  Final  Culture, blood (routine x 2)     Status: Abnormal   Collection Time: 02/15/19 12:49 PM   Specimen: BLOOD  Result Value Ref Range Status   Specimen Description   Final    BLOOD LT HAND Performed at [facility], [address] [place], [place] [address]    Special Requests   Final    BOTTLES DRAWN AEROBIC AND ANAEROBIC Blood Culture results may not be optimal due to an inadequate volume of blood received in culture bottles Performed at [facility], [address] [place], [place] [address]    Culture  Setup Time   Final    GRAM POSITIVE COCCI IN BOTH AEROBIC AND ANAEROBIC BOTTLES CRITICAL RESULT CALLED TO, READ BACK BY AND VERIFIED WITHEleonore Chiquito AT [id number] 02/16/2019 [place] Performed at [facility], [address] [place], [place] [address]    Culture (A)  Final    METHICILLIN RESISTANT STAPHYLOCOCCUS AUREUS STAPHYLOCOCCUS SPECIES (COAGULASE NEGATIVE) THE SIGNIFICANCE OF ISOLATING THIS  ORGANISM FROM A SINGLE SET OF BLOOD CULTURES WHEN MULTIPLE SETS ARE DRAWN IS UNCERTAIN. PLEASE NOTIFY THE MICROBIOLOGY DEPARTMENT WITHIN ONE WEEK IF SPECIATION AND SENSITIVITIES ARE REQUIRED. Performed at [facility], [place] [address], [place], [place] [address]    Report Status 02/18/2019 FINAL  Final   Organism ID, Bacteria METHICILLIN RESISTANT STAPHYLOCOCCUS AUREUS  Final      Susceptibility   Methicillin resistant staphylococcus aureus - MIC*    CIPROFLOXACIN >=8 RESISTANT Resistant     ERYTHROMYCIN >=8 RESISTANT Resistant     GENTAMICIN <=0.5 SENSITIVE Sensitive     OXACILLIN >=4 RESISTANT Resistant     TETRACYCLINE <=1 SENSITIVE Sensitive     VANCOMYCIN <=0.5 SENSITIVE Sensitive     TRIMETH/SULFA <=10 SENSITIVE Sensitive     CLINDAMYCIN RESISTANT Resistant     RIFAMPIN <=0.5 SENSITIVE Sensitive     Inducible Clindamycin POSITIVE Resistant     * METHICILLIN RESISTANT STAPHYLOCOCCUS AUREUS  Aerobic Culture (superficial specimen)     Status: None   Collection Time: 02/17/19  1:31 PM   Specimen: Heel; Wound  Result Value Ref Range Status   Specimen Description   Final    HEEL Performed at [facility], [address] [place], [place] [address]    Special Requests   Final    NONE Performed at [facility], [address] [place] [address], [place],  [address]  Gram Stain   Final    NO WBC SEEN MODERATE GRAM POSITIVE COCCI IN PAIRS    Culture   Final    ABUNDANT METHICILLIN RESISTANT STAPHYLOCOCCUS AUREUS ABUNDANT DIPHTHEROIDS(CORYNEBACTERIUM SPECIES) Standardized susceptibility testing for this organism is not available. Performed at [facility], [place] [address] [place], [place] [address]    Report Status 02/19/2019 FINAL  Final   Organism ID, Bacteria METHICILLIN RESISTANT STAPHYLOCOCCUS AUREUS  Final      Susceptibility   Methicillin resistant staphylococcus aureus - MIC*    CIPROFLOXACIN >=8 RESISTANT Resistant     ERYTHROMYCIN >=8 RESISTANT  Resistant     GENTAMICIN <=0.5 SENSITIVE Sensitive     OXACILLIN >=4 RESISTANT Resistant     TETRACYCLINE <=1 SENSITIVE Sensitive     VANCOMYCIN 1 SENSITIVE Sensitive     TRIMETH/SULFA <=10 SENSITIVE Sensitive     CLINDAMYCIN RESISTANT Resistant     RIFAMPIN <=0.5 SENSITIVE Sensitive     Inducible Clindamycin POSITIVE Resistant     * ABUNDANT METHICILLIN RESISTANT STAPHYLOCOCCUS AUREUS  CULTURE, BLOOD (ROUTINE X 2) w Reflex to ID Panel     Status: None   Collection Time: 02/18/19 11:29 PM   Specimen: BLOOD  Result Value Ref Range Status   Specimen Description BLOOD LEFT ARM  Final   Special Requests   Final    BOTTLES DRAWN AEROBIC AND ANAEROBIC Blood Culture adequate volume   Culture   Final    NO GROWTH 5 DAYS Performed at [facility], [address] [place], [place] [address]    Report Status 02/23/2019 FINAL  Final  CULTURE, BLOOD (ROUTINE X 2) w Reflex to ID Panel     Status: None   Collection Time: 02/18/19 11:29 PM   Specimen: BLOOD  Result Value Ref Range Status   Specimen Description BLOOD LEFT ARM  Final   Special Requests   Final    BOTTLES DRAWN AEROBIC AND ANAEROBIC Blood Culture adequate volume   Culture   Final    NO GROWTH 5 DAYS Performed at [facility], [address], [place], [place] [address]    Report Status 02/23/2019 FINAL  Final  Aerobic/Anaerobic Culture (surgical/deep wound)     Status: None   Collection Time: 03/04/19 11:04 AM   Specimen: PATH Other; Tissue  Result Value Ref Range Status   Specimen Description   Final    ARTERY Performed at [facility], [address] [place], [place] [address]    Special Requests   Final    RIGHT AV GRAFT AND STENTS CULTURE Performed at [facility], [address] [place], [place] [address]    Gram Stain   Final    RARE WBC PRESENT, PREDOMINANTLY PMN NO ORGANISMS SEEN    Culture   Final    No growth aerobically or anaerobically. Performed at [facility], [place] [address], [place], [place] [address]    Report Status 03/09/2019 FINAL  Final  Aerobic/Anaerobic Culture (surgical/deep wound)     Status: None   Collection Time: 03/04/19 11:29 AM   Specimen: PATH Other; Tissue  Result Value Ref Range Status   Specimen Description   Final    ARTERY Performed at [facility], [address] [place], [place] [address]    Special Requests   Final    FLUID AROUND AV GRAFT Performed at [facility], [address], [place], [place] [address]    Gram Stain   Final  FEW WBC PRESENT, PREDOMINANTLY PMN NO ORGANISMS SEEN    Culture   Final    No growth aerobically or anaerobically. Performed at [facility], [place] [address], [place], [place] [address]    Report Status 03/09/2019 FINAL  Final    Coagulation Studies: Recent Labs    03/11/19 0502 03/12/19 0536 03/13/19 0618  LABPROT 14.6 15.2 16.7*  INR 1.2 1.2 1.4*    Urinalysis: No results for input(s): COLORURINE, LABSPEC, PHURINE, GLUCOSEU, HGBUR, BILIRUBINUR, KETONESUR, PROTEINUR, UROBILINOGEN, NITRITE, LEUKOCYTESUR in the last 72 hours.  Invalid input(s): APPERANCEUR    Imaging: No results found.   Medications:   . sodium chloride Stopped (03/01/19 0429)  . feeding supplement (OSMOLITE 1.5 CAL) 1,000 mL (03/12/19 0947)  . vancomycin 750 mg (03/11/19 1358)   . [START ON 03/14/2019] apixaban  10 mg Oral BID   Followed by  . [START ON 03/21/2019] apixaban  5 mg Oral BID  . aspirin  81 mg Oral Daily  . B-complex with vitamin C  1 tablet Per Tube Daily  . Chlorhexidine Gluconate Cloth  6 each Topical Q0600  . epoetin (EPOGEN/PROCRIT) injection  4,000 Units Intravenous Q M,W,F-HD  . feeding supplement (PRO-STAT SUGAR FREE 64)  30 mL Per Tube Daily  . fluticasone  2 spray Each Nare Daily  . free water  20 mL Per Tube Q4H  . insulin aspart  0-15 Units Subcutaneous Q4H  . levothyroxine  88 mcg Oral QAC breakfast  . mouth rinse  15 mL Mouth Rinse  BID  . midodrine  10 mg Oral TID WC  . pantoprazole (PROTONIX) IV  40 mg Intravenous Q12H  . psyllium  1 packet Per Tube BID  . venlafaxine  75 mg Oral BID WC  . zinc sulfate  220 mg Oral Daily   sodium chloride, ipratropium-albuterol, ondansetron (ZOFRAN) IV  Assessment/ Plan:  [name] is a 64 y.o. white female with end-stage renal disease on hemodialysis, atrial fibrillation, chronic diastolic congestive heart failure, coronary artery disease status post CABG, diabetes mellitus type II, hypertension, hyperlipidemia, hypothyroidism, Covid positive in July 2020, recent nursing home stay in [place] Prolonged hospitalization for MRSA bacteremia/sepsis and now with acute encephalopathy  [facility] [address] Left AVG 81.5kg  1. End Stage Renal Disease: on hemodialysis. With complication of dialysis device.  -rt arm AVG removed 12/30 due to suspected source of bacteremia -Patient due for hemodialysis treatment today.  Orders have been prepared.  2. Hypotension -Continue midodrine 10 mg 3 times daily.  3. Anemia of chronic kidney disease:   Status post PRBC transfusion on 12/22 -Hemoglobin up to 8.5.  Maintain the patient on Epogen 4000 units IV with dialysis.  4. Secondary Hyperparathyroidism:  Check serum phosphorus today. Lab Results  Component Value Date   PTH 135 (H) 02/22/2017   CALCIUM 8.4 (L) 03/11/2019   CAION 1.20 07/03/2013   PHOS 3.5 03/06/2019    5. Acute Encephalopathy:  -No significant change in mental status.  Remains arousable but confused.  6. Sepsis from MRSA - Abx as per ID team - Source of MRSA has not been identified -AV graft  removed 12/30  7. A Fib -Management as per hospitalist and cardiology.  8. DM with CKD Insulin dependent Lab Results  Component Value Date   HGBA1C 5.3 02/09/2019   9. Left arm DVT Dx 12/9  10.  Acute respiratory failure Continues to do well post extubation from a respiratory status.  LOS:  [address] [place] 1/8/20211:39 PM

## 2019-03-13 NOTE — TOC Progression Note (Signed)
Transition of Care [facility]) - Progression Note    Patient Details  Name: Jamie Arias MRN: [id number] Date of Birth: 04-01-1955  Transition of Care [facility]) CM/SW [place], LCSW Phone Number: 03/13/2019, 9:54 AM  Clinical Narrative: No HD beds at [facility] today.    Expected Discharge Plan: [place] Barriers to Discharge: Continued Medical Work up  Expected Discharge Plan and Services Expected Discharge Plan: [place] In-house Referral: Clinical Social Work     Living arrangements for the past 2 months: Mobile Home                                       Social Determinants of Health (SDOH) Interventions    Readmission Risk Interventions Readmission Risk Prevention Plan 02/13/2019 09/15/2018  Transportation Screening - Complete  Medication Review Press photographer) Complete Complete  PCP or Specialist appointment within 3-5 days of discharge - Complete  HRI or [place] Complete Complete  SW Recovery Care/Counseling Consult Complete Complete  [place] Not Applicable Complete  Some recent data might be hidden

## 2019-03-13 NOTE — Consult Note (Signed)
Pharmacy Antibiotic Note  Jamie Arias is a 64 y.o. female admitted on 02/09/2019 with methicillin resistant Staphylococcus aureus.  Pharmacy has been consulted for vancomycin dosing (previous antibiotics included daptomycin and ceftaroline) She recently underwent PEG placement and AVG removal.  Patient received vancomycin loading dose 1500 mg x1 with dialysis on 12/24.  (per ID: Post covid morbidity- Long COVID)    Plan: Continue vancomycin 750 mg IV qHD MWF. Level therapeutic 1/6. Will follow levels once weekly unless otherwise clinically indicated. Next level 1/13 prior to dialysis.    Height: 5' 4.02" (162.6 cm) Weight: 192 lb 7.4 oz (87.3 kg) IBW/kg (Calculated) : 54.74  Temp (24hrs), Avg:99 F (37.2 C), Min:97.5 F (36.4 C), Max:99.8 F (37.7 C)  Recent Labs  Lab 03/07/19 0423 03/08/19 0700 03/09/19 0528 03/10/19 0600 03/11/19 0502 03/12/19 0536 03/13/19 0618  WBC 8.9 8.9 9.1 9.1 9.8 10.0 8.9  CREATININE 1.88* 2.70* 3.29*  --  2.97*  --   --   VANCORANDOM  --   --  20  --  20  --   --     Estimated Creatinine Clearance: 20.2 mL/min (A) (by C-G formula based on SCr of 2.97 mg/dL (H)).    Antimicrobials this admission: ceftaroline 12/14 >> 12/21 daptomycin 12/14 >> 12/26 Vancomycin 12/24 >>  Microbiology results: 12/30 Aerobic/Anaerobic Cx:  NG 12/16 BCx: NG 12/15 WCx MRSA 12/13 BCx: MRSA  12/11 BCx: MRSA  Thank you for allowing pharmacy to be a part of this patient's care.  Dorena Bodo, PharmD Clinical Pharmacist 03/13/2019 10:25 AM

## 2019-03-13 NOTE — Progress Notes (Signed)
This note also relates to the following rows which could not be included: Pulse Rate - Cannot attach notes to unvalidated device data Resp - Cannot attach notes to unvalidated device data  Hd started  

## 2019-03-14 LAB — RENAL FUNCTION PANEL
Albumin: 1.8 g/dL — ABNORMAL LOW (ref 3.5–5.0)
Anion gap: 4 — ABNORMAL LOW (ref 5–15)
BUN: 21 mg/dL (ref 8–23)
CO2: 30 mmol/L (ref 22–32)
Calcium: 8.1 mg/dL — ABNORMAL LOW (ref 8.9–10.3)
Chloride: 104 mmol/L (ref 98–111)
Creatinine, Ser: 1.9 mg/dL — ABNORMAL HIGH (ref 0.44–1.00)
GFR calc Af Amer: 32 mL/min — ABNORMAL LOW (ref >60)
GFR calc non Af Amer: 27 mL/min — ABNORMAL LOW (ref >60)
Glucose, Bld: 171 mg/dL — ABNORMAL HIGH (ref 70–99)
Phosphorus: 2 mg/dL — ABNORMAL LOW (ref 2.5–4.6)
Potassium: 2.8 mmol/L — ABNORMAL LOW (ref 3.5–5.1)
Sodium: 138 mmol/L (ref 135–145)

## 2019-03-14 LAB — FERRITIN: Ferritin: 1664 ng/mL — ABNORMAL HIGH (ref 11–307)

## 2019-03-14 LAB — IRON AND TIBC: Iron: 54 ug/dL (ref 28–170)

## 2019-03-14 LAB — GLUCOSE, CAPILLARY
Glucose-Capillary: 125 mg/dL — ABNORMAL HIGH (ref 70–99)
Glucose-Capillary: 156 mg/dL — ABNORMAL HIGH (ref 70–99)
Glucose-Capillary: 186 mg/dL — ABNORMAL HIGH (ref 70–99)
Glucose-Capillary: 198 mg/dL — ABNORMAL HIGH (ref 70–99)
Glucose-Capillary: 220 mg/dL — ABNORMAL HIGH (ref 70–99)
Glucose-Capillary: 245 mg/dL — ABNORMAL HIGH (ref 70–99)

## 2019-03-14 LAB — CBC
HCT: 24.2 % — ABNORMAL LOW (ref 36.0–46.0)
Hemoglobin: 7.4 g/dL — ABNORMAL LOW (ref 12.0–15.0)
MCH: 30.6 pg (ref 26.0–34.0)
MCHC: 30.6 g/dL (ref 30.0–36.0)
MCV: 100 fL (ref 80.0–100.0)
Platelets: 235 10*3/uL (ref 150–400)
RBC: 2.42 MIL/uL — ABNORMAL LOW (ref 3.87–5.11)
RDW: 22.4 % — ABNORMAL HIGH (ref 11.5–15.5)
WBC: 9.1 10*3/uL (ref 4.0–10.5)
nRBC: 0.4 % — ABNORMAL HIGH (ref 0.0–0.2)

## 2019-03-14 MED ORDER — APIXABAN 5 MG PO TABS
5.0000 mg | ORAL_TABLET | Freq: Two times a day (BID) | ORAL | Status: DC
  Administered 2019-03-14 – 2019-03-15 (×4): 5 mg via ORAL
  Filled 2019-03-14 (×4): qty 1

## 2019-03-14 MED ORDER — POTASSIUM PHOSPHATES 15 MMOLE/5ML IV SOLN
30.0000 mmol | Freq: Once | INTRAVENOUS | Status: AC
  Administered 2019-03-14: 30 mmol via INTRAVENOUS
  Filled 2019-03-14: qty 10

## 2019-03-14 NOTE — Progress Notes (Signed)
[facility]  ROUNDING NOTE   Subjective:   No change in mental status. Husband at bedside.   Hemodialysis treatment yesterday. Tolerated treatment. UF of 1 liter  Objective:  Vital signs in last 24 hours:  Temp:  [98.4 F (36.9 C)-98.8 F (37.1 C)] 98.7 F (37.1 C) (01/09 1140) Pulse Rate:  [82-87] 83 (01/09 1140) Resp:  [15-18] 18 (01/09 1140) BP: (132-157)/(47-68) 135/62 (01/09 1140) SpO2:  [94 %-100 %] 100 % (01/09 1140)  Weight change:  Filed Weights   03/02/19 0930 03/06/19 1500 03/10/19 1220  Weight: 75.5 kg 83 kg 87.3 kg    Intake/Output: I/O last 3 completed shifts: In: 2238.5 [I.V.:380.5; Other:975; NG/GT:883] Out: 1400 [Other:1000; Stool:400]   Intake/Output this shift:  No intake/output data recorded.  Physical Exam: General: NAD, laying in bed  HEENT [place]/AT   Lungs:  Scatterd rhonchi, normal effort  Heart: Regular rate and rhythm  Abdomen:  Soft, nontender, PEG tube in place  Extremities: 2+ upper and lower extremity edema  Neurologic: Lethargic but arousable  GU Rectal tube  Access: Left IJ PC      Basic Metabolic Panel: Recent Labs  Lab 03/08/19 0700 03/09/19 0528 03/11/19 0502 03/14/19 0502  NA 135 134* 137 138  K 3.7 3.8 3.4* 2.8*  CL 100 101 102 104  CO2 26 23 29 30   GLUCOSE 122* 141* 151* 171*  BUN 30* 39* 33* 21  CREATININE 2.70* 3.29* 2.97* 1.90*  CALCIUM 8.1* 8.4* 8.4* 8.1*  MG 2.2 2.2 2.1  --   PHOS  --   --   --  2.0*    Liver Function Tests: Recent Labs  Lab 03/09/19 0528 03/11/19 0502 03/14/19 0502  AST 18 17  --   ALT 7 7  --   ALKPHOS 68 74  --   BILITOT 0.9 0.7  --   PROT 4.9* 5.0*  --   ALBUMIN 1.9* 1.8* 1.8*   No results for input(s): LIPASE, AMYLASE in the last 168 hours. No results for input(s): AMMONIA in the last 168 hours.  CBC: Recent Labs  Lab 03/10/19 0600 03/11/19 0502 03/12/19 0536 03/13/19 0618 03/14/19 0502  WBC 9.1 9.8 10.0 8.9 9.1  HGB 7.4* 7.2* 7.8* 8.5* 7.4*  HCT  23.9* 22.6* 24.7* 27.4* 24.2*  MCV 96.4 97.0 98.0 98.9 100.0  PLT 261 245 279 262 235    Cardiac Enzymes: No results for input(s): CKTOTAL, CKMB, CKMBINDEX, TROPONINI in the last 168 hours.  BNP: Invalid input(s): POCBNP  CBG: Recent Labs  Lab 03/13/19 1956 03/14/19 0034 03/14/19 0409 03/14/19 0722 03/14/19 1138  GLUCAP 117* 245* 186* 125* 4*    Microbiology: Results for orders placed or performed during the hospital encounter of 02/09/19  Culture, blood (routine x 2)     Status: Abnormal   Collection Time: 02/09/19  2:29 PM   Specimen: BLOOD LEFT ARM  Result Value Ref Range Status   Specimen Description   Final    BLOOD LEFT ARM Performed at [facility], [address], [place], [place] [address]    Special Requests   Final    BOTTLES DRAWN AEROBIC AND ANAEROBIC Blood Culture adequate volume Performed at [facility], [address] [place], [place] [address]    Culture  Setup Time   Final    GRAM POSITIVE COCCI IN BOTH AEROBIC AND ANAEROBIC BOTTLES CRITICAL VALUE NOTED.  VALUE IS CONSISTENT WITH PREVIOUSLY REPORTED AND CALLED VALUE. Performed at [facility], [place]  [address] [place], [place] [address]    Culture (A)  Final    STAPHYLOCOCCUS AUREUS SUSCEPTIBILITIES PERFORMED ON PREVIOUS CULTURE WITHIN THE LAST 5 DAYS. Performed at [facility], [place] [address] [place], [place] [address]    Report Status 02/12/2019 FINAL  Final  Culture, blood (routine x 2)     Status: Abnormal   Collection Time: 02/09/19  2:38 PM   Specimen: BLOOD LEFT ARM  Result Value Ref Range Status   Specimen Description   Final    BLOOD LEFT ARM Performed at [facility], [address], [place], [place] [address]    Special Requests   Final    BOTTLES DRAWN AEROBIC AND ANAEROBIC Blood Culture adequate volume Performed at [facility], [address], [place], [place] [address]    Culture  Setup Time   Final    GRAM  POSITIVE COCCI IN BOTH AEROBIC AND ANAEROBIC BOTTLES CRITICAL RESULT CALLED TO, READ BACK BY AND VERIFIED WITH: [place] [id number] 02/10/2019 HNM Performed at [facility], [place] [address] [place], [place] [address]    Culture METHICILLIN RESISTANT STAPHYLOCOCCUS AUREUS (A)  Final   Report Status 02/12/2019 FINAL  Final   Organism ID, Bacteria METHICILLIN RESISTANT STAPHYLOCOCCUS AUREUS  Final      Susceptibility   Methicillin resistant staphylococcus aureus - MIC*    CIPROFLOXACIN >=8 RESISTANT Resistant     ERYTHROMYCIN >=8 RESISTANT Resistant     GENTAMICIN <=0.5 SENSITIVE Sensitive     OXACILLIN >=4 RESISTANT Resistant     TETRACYCLINE <=1 SENSITIVE Sensitive     VANCOMYCIN 1 SENSITIVE Sensitive     TRIMETH/SULFA <=10 SENSITIVE Sensitive     CLINDAMYCIN RESISTANT Resistant     RIFAMPIN <=0.5 SENSITIVE Sensitive     Inducible Clindamycin POSITIVE Resistant     * METHICILLIN RESISTANT STAPHYLOCOCCUS AUREUS  Blood Culture ID Panel (Reflexed)     Status: Abnormal   Collection Time: 02/09/19  2:38 PM  Result Value Ref Range Status   Enterococcus species NOT DETECTED NOT DETECTED Final   Listeria monocytogenes NOT DETECTED NOT DETECTED Final   Staphylococcus species DETECTED (A) NOT DETECTED Final    Comment: CRITICAL RESULT CALLED TO, READ BACK BY AND VERIFIED WITH: Hart Robinsons PHARMD [id number] 02/10/2019 HNM    Staphylococcus aureus (BCID) DETECTED (A) NOT DETECTED Final    Comment: Methicillin (oxacillin)-resistant Staphylococcus aureus (MRSA). MRSA is predictably resistant to beta-lactam antibiotics (except ceftaroline). Preferred therapy is vancomycin unless clinically contraindicated. Patient requires contact precautions if  hospitalized. CRITICAL RESULT CALLED TO, READ BACK BY AND VERIFIED WITH: Hart Robinsons PHARMD [id number] 02/10/2019 HNM    Methicillin resistance DETECTED (A) NOT DETECTED Final    Comment: CRITICAL RESULT CALLED TO, READ BACK BY AND VERIFIED WITH: Hart Robinsons  PHARMD [id number] 02/10/2019 HNM    Streptococcus species NOT DETECTED NOT DETECTED Final   Streptococcus agalactiae NOT DETECTED NOT DETECTED Final   Streptococcus pneumoniae NOT DETECTED NOT DETECTED Final   Streptococcus pyogenes NOT DETECTED NOT DETECTED Final   Acinetobacter baumannii NOT DETECTED NOT DETECTED Final   Enterobacteriaceae species NOT DETECTED NOT DETECTED Final   Enterobacter cloacae complex NOT DETECTED NOT DETECTED Final   Escherichia coli NOT DETECTED NOT DETECTED Final   Klebsiella oxytoca NOT DETECTED NOT DETECTED Final   Klebsiella pneumoniae NOT DETECTED NOT DETECTED Final   Proteus species NOT DETECTED NOT DETECTED Final   Serratia marcescens NOT DETECTED NOT DETECTED Final   Haemophilus influenzae NOT DETECTED  NOT DETECTED Final   Neisseria meningitidis NOT DETECTED NOT DETECTED Final   Pseudomonas aeruginosa NOT DETECTED NOT DETECTED Final   Candida albicans NOT DETECTED NOT DETECTED Final   Candida glabrata NOT DETECTED NOT DETECTED Final   Candida krusei NOT DETECTED NOT DETECTED Final   Candida parapsilosis NOT DETECTED NOT DETECTED Final   Candida tropicalis NOT DETECTED NOT DETECTED Final    Comment: Performed at [facility], [place], [place] [address]  SARS CORONAVIRUS 2 (TAT 6-24 HRS) Nasopharyngeal Nasopharyngeal Swab     Status: None   Collection Time: 02/09/19  2:54 PM   Specimen: Nasopharyngeal Swab  Result Value Ref Range Status   SARS Coronavirus 2 NEGATIVE NEGATIVE Final    Comment: (NOTE) SARS-CoV-2 target nucleic acids are NOT DETECTED. The SARS-CoV-2 RNA is generally detectable in upper and lower respiratory specimens during the acute phase of infection. Negative results do not preclude SARS-CoV-2 infection, do not rule out co-infections with other pathogens, and should not be used as the sole basis for treatment or other patient management decisions. Negative results must be combined with clinical  observations, patient history, and epidemiological information. The expected result is Negative. Fact Sheet for Patients: SugarRoll.be Fact Sheet for Healthcare Providers: https://www.woods-mathews.com/ This test is not yet approved or cleared by the [place] FDA and  has been authorized for detection and/or diagnosis of SARS-CoV-2 by FDA under an Emergency Use Authorization (EUA). This EUA will remain  in effect (meaning this test can be used) for the duration of the COVID-19 declaration under Section 56 4(b)(1) of the Act, 21 U.S.C. section 360bbb-3(b)(1), unless the authorization is terminated or revoked sooner. Performed at [facility], [place] [address] [place], [place] [address]   MRSA PCR Screening     Status: Abnormal   Collection Time: 02/10/19  5:03 PM   Specimen: Nasopharyngeal  Result Value Ref Range Status   MRSA by PCR POSITIVE (A) NEGATIVE Final    Comment:        The GeneXpert MRSA Assay (FDA approved for NASAL specimens only), is one component of a comprehensive MRSA colonization surveillance program. It is not intended to diagnose MRSA infection nor to guide or monitor treatment for MRSA infections. RESULT CALLED TO, READ BACK BY AND VERIFIED WITH: Britt Boozer @1839  02/10/19 MJU Performed at [facility], [address] [place], [place] [address]   Culture, blood (routine x 2)     Status: Abnormal   Collection Time: 02/11/19 12:31 PM   Specimen: BLOOD  Result Value Ref Range Status   Specimen Description   Final    BLOOD PORTA CATH Performed at [place] [address], [place], [place] [address]    Special Requests   Final    BOTTLES DRAWN AEROBIC AND ANAEROBIC Blood Culture adequate volume Performed at [facility], [address] [place], [place] [address]    Culture  Setup Time   Final    GRAM POSITIVE COCCI ANAEROBIC BOTTLE ONLY CRITICAL RESULT CALLED TO, READ  BACK BY AND VERIFIED WITH: SCOTT HALL AT [id number] 02/12/2019 [facility]  Performed at [facility], [address] [place], [place] [address]    Culture (A)  Final    STAPHYLOCOCCUS AUREUS SUSCEPTIBILITIES PERFORMED ON PREVIOUS CULTURE WITHIN THE LAST 5 DAYS. Performed at [facility], [place] [address], [place], [place] [address]    Report Status 02/13/2019 FINAL  Final  Culture, blood (routine x 2)  Status: None   Collection Time: 02/11/19  3:57 PM   Specimen: BLOOD  Result Value Ref Range Status   Specimen Description BLOOD PORTA CATH  Final   Special Requests   Final    BOTTLES DRAWN AEROBIC AND ANAEROBIC Blood Culture adequate volume   Culture   Final    NO GROWTH 5 DAYS Performed at [facility], [address] [place], [place] [address]    Report Status 02/16/2019 FINAL  Final  CULTURE, BLOOD (ROUTINE X 2) w Reflex to ID Panel     Status: Abnormal   Collection Time: 02/13/19  2:18 PM   Specimen: BLOOD LEFT HAND  Result Value Ref Range Status   Specimen Description BLOOD LEFT HAND  Final   Special Requests   Final    BOTTLES DRAWN AEROBIC AND ANAEROBIC Blood Culture results may not be optimal due to an inadequate volume of blood received in culture bottles   Culture  Setup Time   Final    AEROBIC BOTTLE ONLY GRAM POSITIVE COCCI IN CLUSTERS CRITICAL RESULT CALLED TO, READ BACK BY AND VERIFIED WITH: Herbert Pun AT [id number] ON 02/14/2019 [place]. Performed at [facility], [place] [address] [place], [place] [address]    Culture METHICILLIN RESISTANT STAPHYLOCOCCUS AUREUS (A)  Final   Report Status 02/16/2019 FINAL  Final   Organism ID, Bacteria METHICILLIN RESISTANT STAPHYLOCOCCUS AUREUS  Final      Susceptibility   Methicillin resistant staphylococcus aureus - MIC*    CIPROFLOXACIN >=8 RESISTANT Resistant     ERYTHROMYCIN >=8 RESISTANT Resistant     GENTAMICIN <=0.5 SENSITIVE Sensitive     OXACILLIN >=4 RESISTANT Resistant     TETRACYCLINE <=1  SENSITIVE Sensitive     VANCOMYCIN <=0.5 SENSITIVE Sensitive     TRIMETH/SULFA <=10 SENSITIVE Sensitive     CLINDAMYCIN RESISTANT Resistant     RIFAMPIN <=0.5 SENSITIVE Sensitive     Inducible Clindamycin POSITIVE Resistant     * METHICILLIN RESISTANT STAPHYLOCOCCUS AUREUS  Blood Culture ID Panel (Reflexed)     Status: Abnormal   Collection Time: 02/13/19  2:18 PM  Result Value Ref Range Status   Enterococcus species NOT DETECTED NOT DETECTED Final   Listeria monocytogenes NOT DETECTED NOT DETECTED Final   Staphylococcus species DETECTED (A) NOT DETECTED Final    Comment: CRITICAL RESULT CALLED TO, READ BACK BY AND VERIFIED WITH: ABBEY ELLINGTON AT [id number] ON 02/14/2019 [place].    Staphylococcus aureus (BCID) DETECTED (A) NOT DETECTED Final    Comment: Methicillin (oxacillin)-resistant Staphylococcus aureus (MRSA). MRSA is predictably resistant to beta-lactam antibiotics (except ceftaroline). Preferred therapy is vancomycin unless clinically contraindicated. Patient requires contact precautions if  hospitalized. CRITICAL RESULT CALLED TO, READ BACK BY AND VERIFIED WITH: Herbert Pun AT [id number] ON 02/14/2019 [place].    Methicillin resistance DETECTED (A) NOT DETECTED Final    Comment: CRITICAL RESULT CALLED TO, READ BACK BY AND VERIFIED WITH: Herbert Pun AT [id number] ON 02/14/2019 [place].    Streptococcus species NOT DETECTED NOT DETECTED Final   Streptococcus agalactiae NOT DETECTED NOT DETECTED Final   Streptococcus pneumoniae NOT DETECTED NOT DETECTED Final   Streptococcus pyogenes NOT DETECTED NOT DETECTED Final   Acinetobacter baumannii NOT DETECTED NOT DETECTED Final   Enterobacteriaceae species NOT DETECTED NOT DETECTED Final   Enterobacter cloacae complex NOT DETECTED NOT DETECTED Final   Escherichia coli NOT DETECTED NOT DETECTED Final   Klebsiella oxytoca NOT DETECTED NOT DETECTED Final   Klebsiella pneumoniae NOT DETECTED NOT DETECTED Final  Proteus species NOT DETECTED NOT DETECTED  Final   Serratia marcescens NOT DETECTED NOT DETECTED Final   Haemophilus influenzae NOT DETECTED NOT DETECTED Final   Neisseria meningitidis NOT DETECTED NOT DETECTED Final   Pseudomonas aeruginosa NOT DETECTED NOT DETECTED Final   Candida albicans NOT DETECTED NOT DETECTED Final   Candida glabrata NOT DETECTED NOT DETECTED Final   Candida krusei NOT DETECTED NOT DETECTED Final   Candida parapsilosis NOT DETECTED NOT DETECTED Final   Candida tropicalis NOT DETECTED NOT DETECTED Final    Comment: Performed at [facility], [address] [place], [place] [address]  CULTURE, BLOOD (ROUTINE X 2) w Reflex to ID Panel     Status: None   Collection Time: 02/13/19  3:09 PM   Specimen: BLOOD  Result Value Ref Range Status   Specimen Description BLOOD BLOOD LEFT HAND  Final   Special Requests   Final    BOTTLES DRAWN AEROBIC AND ANAEROBIC Blood Culture adequate volume   Culture   Final    NO GROWTH 5 DAYS Performed at [facility], [address], [place], [place] [address]    Report Status 02/18/2019 FINAL  Final  Culture, blood (routine x 2)     Status: Abnormal   Collection Time: 02/15/19 12:49 PM   Specimen: BLOOD  Result Value Ref Range Status   Specimen Description   Final    BLOOD LT HAND Performed at [facility], [place] [place], [place] [address]    Special Requests   Final    BOTTLES DRAWN AEROBIC AND ANAEROBIC Blood Culture results may not be optimal due to an inadequate volume of blood received in culture bottles Performed at [facility], [address] [place], [place] [address]    Culture  Setup Time   Final    GRAM POSITIVE COCCI IN BOTH AEROBIC AND ANAEROBIC BOTTLES CRITICAL RESULT CALLED TO, READ BACK BY AND VERIFIED WITHEleonore Chiquito AT [id number] 02/16/2019 SDR Performed at [facility], [address] [place], [place] [address]    Culture (A)  Final    METHICILLIN RESISTANT STAPHYLOCOCCUS  AUREUS STAPHYLOCOCCUS SPECIES (COAGULASE NEGATIVE) THE SIGNIFICANCE OF ISOLATING THIS ORGANISM FROM A SINGLE SET OF BLOOD CULTURES WHEN MULTIPLE SETS ARE DRAWN IS UNCERTAIN. PLEASE NOTIFY THE MICROBIOLOGY DEPARTMENT WITHIN ONE WEEK IF SPECIATION AND SENSITIVITIES ARE REQUIRED. Performed at [facility], [place] [address], [place], [place] [address]    Report Status 02/18/2019 FINAL  Final   Organism ID, Bacteria METHICILLIN RESISTANT STAPHYLOCOCCUS AUREUS  Final      Susceptibility   Methicillin resistant staphylococcus aureus - MIC*    CIPROFLOXACIN >=8 RESISTANT Resistant     ERYTHROMYCIN >=8 RESISTANT Resistant     GENTAMICIN <=0.5 SENSITIVE Sensitive     OXACILLIN >=4 RESISTANT Resistant     TETRACYCLINE <=1 SENSITIVE Sensitive     VANCOMYCIN <=0.5 SENSITIVE Sensitive     TRIMETH/SULFA <=10 SENSITIVE Sensitive     CLINDAMYCIN RESISTANT Resistant     RIFAMPIN <=0.5 SENSITIVE Sensitive     Inducible Clindamycin POSITIVE Resistant     * METHICILLIN RESISTANT STAPHYLOCOCCUS AUREUS  Aerobic Culture (superficial specimen)     Status: None   Collection Time: 02/17/19  1:31 PM   Specimen: Heel; Wound  Result Value Ref Range Status   Specimen Description   Final    HEEL Performed at [facility], [address], [place], [place] [address]    Special Requests  Final    NONE Performed at [facility], [address] [place], [place] [address]    Gram Stain   Final    NO WBC SEEN MODERATE GRAM POSITIVE COCCI IN PAIRS    Culture   Final    ABUNDANT METHICILLIN RESISTANT STAPHYLOCOCCUS AUREUS ABUNDANT DIPHTHEROIDS(CORYNEBACTERIUM SPECIES) Standardized susceptibility testing for this organism is not available. Performed at [facility], [place] [address], [place], [place] [address]    Report Status 02/19/2019 FINAL  Final   Organism ID, Bacteria METHICILLIN RESISTANT STAPHYLOCOCCUS AUREUS  Final      Susceptibility   Methicillin resistant staphylococcus  aureus - MIC*    CIPROFLOXACIN >=8 RESISTANT Resistant     ERYTHROMYCIN >=8 RESISTANT Resistant     GENTAMICIN <=0.5 SENSITIVE Sensitive     OXACILLIN >=4 RESISTANT Resistant     TETRACYCLINE <=1 SENSITIVE Sensitive     VANCOMYCIN 1 SENSITIVE Sensitive     TRIMETH/SULFA <=10 SENSITIVE Sensitive     CLINDAMYCIN RESISTANT Resistant     RIFAMPIN <=0.5 SENSITIVE Sensitive     Inducible Clindamycin POSITIVE Resistant     * ABUNDANT METHICILLIN RESISTANT STAPHYLOCOCCUS AUREUS  CULTURE, BLOOD (ROUTINE X 2) w Reflex to ID Panel     Status: None   Collection Time: 02/18/19 11:29 PM   Specimen: BLOOD  Result Value Ref Range Status   Specimen Description BLOOD LEFT ARM  Final   Special Requests   Final    BOTTLES DRAWN AEROBIC AND ANAEROBIC Blood Culture adequate volume   Culture   Final    NO GROWTH 5 DAYS Performed at [facility], [address] [place], [place] [address]    Report Status 02/23/2019 FINAL  Final  CULTURE, BLOOD (ROUTINE X 2) w Reflex to ID Panel     Status: None   Collection Time: 02/18/19 11:29 PM   Specimen: BLOOD  Result Value Ref Range Status   Specimen Description BLOOD LEFT ARM  Final   Special Requests   Final    BOTTLES DRAWN AEROBIC AND ANAEROBIC Blood Culture adequate volume   Culture   Final    NO GROWTH 5 DAYS Performed at [facility], [address] [place], [place] [address]    Report Status 02/23/2019 FINAL  Final  Aerobic/Anaerobic Culture (surgical/deep wound)     Status: None   Collection Time: 03/04/19 11:04 AM   Specimen: PATH Other; Tissue  Result Value Ref Range Status   Specimen Description   Final    ARTERY Performed at [facility], [address] [place], [place] [address]    Special Requests   Final    RIGHT AV GRAFT AND STENTS CULTURE Performed at [facility] [place], [address] [place], [place] [address]    Gram Stain   Final    RARE WBC PRESENT, PREDOMINANTLY PMN NO ORGANISMS SEEN     Culture   Final    No growth aerobically or anaerobically. Performed at [facility], [place] [address], [place], [place] [address]    Report Status 03/09/2019 FINAL  Final  Aerobic/Anaerobic Culture (surgical/deep wound)     Status: None   Collection Time: 03/04/19 11:29 AM   Specimen: PATH Other; Tissue  Result Value Ref Range Status   Specimen Description   Final    ARTERY Performed at [facility], [address], [place],  [address]    Special Requests   Final    FLUID AROUND AV GRAFT Performed  at [facility], [place] [place], [place] [address]    Gram Stain   Final    FEW WBC PRESENT, PREDOMINANTLY PMN NO ORGANISMS SEEN    Culture   Final    No growth aerobically or anaerobically. Performed at [facility], [place] [address], [place], [place] [address]    Report Status 03/09/2019 FINAL  Final    Coagulation Studies: Recent Labs    03/12/19 0536 03/13/19 0618  LABPROT 15.2 16.7*  INR 1.2 1.4*    Urinalysis: No results for input(s): COLORURINE, LABSPEC, PHURINE, GLUCOSEU, HGBUR, BILIRUBINUR, KETONESUR, PROTEINUR, UROBILINOGEN, NITRITE, LEUKOCYTESUR in the last 72 hours.  Invalid input(s): APPERANCEUR    Imaging: No results found.   Medications:   . sodium chloride 250 mL (03/13/19 1951)  . feeding supplement (OSMOLITE 1.5 CAL) 1,000 mL (03/12/19 0947)  . potassium PHOSPHATE IVPB (in mmol) 30 mmol (03/14/19 1101)  . vancomycin 750 mg (03/13/19 1958)   . apixaban  5 mg Oral BID  . aspirin  81 mg Oral Daily  . B-complex with vitamin C  1 tablet Per Tube Daily  . Chlorhexidine Gluconate Cloth  6 each Topical Q0600  . epoetin (EPOGEN/PROCRIT) injection  4,000 Units Intravenous Q M,W,F-HD  . feeding supplement (PRO-STAT SUGAR FREE 64)  30 mL Per Tube Daily  . fluticasone  2 spray Each Nare Daily  . free water  20 mL Per Tube Q4H  . insulin aspart  0-15 Units Subcutaneous Q4H  . levothyroxine  88 mcg Oral QAC  breakfast  . mouth rinse  15 mL Mouth Rinse BID  . midodrine  10 mg Oral TID WC  . pantoprazole (PROTONIX) IV  40 mg Intravenous Q12H  . psyllium  1 packet Per Tube BID  . venlafaxine  75 mg Oral BID WC  . zinc sulfate  220 mg Oral Daily   sodium chloride, ipratropium-albuterol, ondansetron (ZOFRAN) IV  Assessment/ Plan:  Jamie Arias is a 64 y.o. white female with end-stage renal disease on hemodialysis, atrial fibrillation, chronic diastolic congestive heart failure, coronary artery disease status post CABG, diabetes mellitus type II, hypertension, hyperlipidemia, hypothyroidism, Covid positive in July 2020, recent nursing home stay in [place] Prolonged hospitalization for MRSA bacteremia/sepsis and now with acute encephalopathy  [facility] MWF [facility] [address] Left AVG 81.5kg  1. End Stage Renal Disease: on hemodialysis. With complication of dialysis device. Right arm AVG removed 12/30 due to suspected source of bacteremia Now with left IJ permcath - Continue MWF schedule  2. Hypotension -Continue midodrine 10 mg 3 times daily.  3. Anemia of chronic kidney disease: hemoglobin 7.4 Status post PRBC transfusion on 12/22 EPO with HD treatments.   4. Secondary Hyperparathyroidism: with hypophosphatemia - IV replacement of phosphorus.   5. Acute Encephalopathy: No significant change in mental status.  Remains arousable but confused. - Will discuss case with neurology.    LOS: Jamie Arias 1/9/20212:34 PM

## 2019-03-14 NOTE — Progress Notes (Signed)
Subjective: Patient admitted on 12/7 with altered level of consciousness.  Found at that time to have MRSA bacteremia and uremia.  Has remained altered.  History obtained from chart and husband.  Per husband patient has been on dialysis for three years.  Has not done well for the past year and spends her time getting dialysis and recovering from getting dialysis.  For the past year he has had to do all of the cooking, cleaning and handling of finances.  He has noticed some memory issues as well.  Had COVID this summer.  Has been nonambulatory since that time.  Patient became less responsive over the course of 24-48 hours prior to admission.  He reports has not worsened or gotten better since admission.  Has had MRI X 2 that show no acute changes, just atrophy and small vessel ischemic changes.  Has had EEG X 2.  The first showed slowing with triphasics and the second was only significant for slowing.    Objective: Current vital signs: BP 135/62 (BP Location: Left Arm)   Pulse 83   Temp 98.7 F (37.1 C) (Oral)   Resp 18   Ht 5' 4.02" (1.626 m)   Wt 87.3 kg   SpO2 100%   BMI 33.02 kg/m  Vital signs in last 24 hours: Temp:  [98.4 F (36.9 C)-98.8 F (37.1 C)] 98.7 F (37.1 C) (01/09 1140) Pulse Rate:  [83-87] 83 (01/09 1140) Resp:  [16-18] 18 (01/09 1140) BP: (135-157)/(47-68) 135/62 (01/09 1140) SpO2:  [94 %-100 %] 100 % (01/09 1140)  Intake/Output from previous day: 01/08 0701 - 01/09 0700 In: 739 [NG/GT:739] Out: 1000  Intake/Output this shift: No intake/output data recorded. Nutritional status:  Diet Order            Diet NPO time specified  Diet effective now              Neurologic Exam: Mental Status: Eyes open when I enter the room and patient appears to be looking at husband.  When I attempt to talk to the patient she closes her eyes. Does not follow commands.  No verbalizations noted.  Cranial Nerves: II: patient does not respond confrontation bilaterally, pupils  right 3 mm, left 3 mm,and reactive bilaterally III,IV,VI: Left gaze preference with intact oculocephalic maneuver.  Does not track husband around the room V,VII: corneal reflex present bilaterally  VIII: patient does not respond to verbal stimuli IX,X: gag reflex not tested, XI: trapezius strength unable to test bilaterally XII: tongue strength unable to test Motor: Increased tone throughout.  No spontaneous movement noted Sensory: Appears to appreciate me touching her extremities Deep Tendon Reflexes:  Symmetric throughout. Plantars: downgoing bilaterally Cerebellar: Unable to perform    Lab Results: Basic Metabolic Panel: Recent Labs  Lab 03/08/19 0700 03/09/19 0528 03/11/19 0502 03/14/19 0502  NA 135 134* 137 138  K 3.7 3.8 3.4* 2.8*  CL 100 101 102 104  CO2 26 23 29 30   GLUCOSE 122* 141* 151* 171*  BUN 30* 39* 33* 21  CREATININE 2.70* 3.29* 2.97* 1.90*  CALCIUM 8.1* 8.4* 8.4* 8.1*  MG 2.2 2.2 2.1  --   PHOS  --   --   --  2.0*    Liver Function Tests: Recent Labs  Lab 03/09/19 0528 03/11/19 0502 03/14/19 0502  AST 18 17  --   ALT 7 7  --   ALKPHOS 68 74  --   BILITOT 0.9 0.7  --   PROT 4.9* 5.0*  --  ALBUMIN 1.9* 1.8* 1.8*   No results for input(s): LIPASE, AMYLASE in the last 168 hours. No results for input(s): AMMONIA in the last 168 hours.  CBC: Recent Labs  Lab 03/10/19 0600 03/11/19 0502 03/12/19 0536 03/13/19 0618 03/14/19 0502  WBC 9.1 9.8 10.0 8.9 9.1  HGB 7.4* 7.2* 7.8* 8.5* 7.4*  HCT 23.9* 22.6* 24.7* 27.4* 24.2*  MCV 96.4 97.0 98.0 98.9 100.0  PLT 261 245 279 262 235    Cardiac Enzymes: No results for input(s): CKTOTAL, CKMB, CKMBINDEX, TROPONINI in the last 168 hours.  Lipid Panel: No results for input(s): CHOL, TRIG, HDL, CHOLHDL, VLDL, LDLCALC in the last 168 hours.  CBG: Recent Labs  Lab 03/13/19 1956 03/14/19 0034 03/14/19 0409 03/14/19 0722 03/14/19 1138  GLUCAP 117* 245* 186* 125* 80*     Microbiology: Results for orders placed or performed during the hospital encounter of 02/09/19  Culture, blood (routine x 2)     Status: Abnormal   Collection Time: 02/09/19  2:29 PM   Specimen: BLOOD LEFT ARM  Result Value Ref Range Status   Specimen Description   Final    BLOOD LEFT ARM Performed at [facility], [address], [place], [place] [address]    Special Requests   Final    BOTTLES DRAWN AEROBIC AND ANAEROBIC Blood Culture adequate volume Performed at [facility], [address], [place], [place] [address]    Culture  Setup Time   Final    GRAM POSITIVE COCCI IN BOTH AEROBIC AND ANAEROBIC BOTTLES CRITICAL VALUE NOTED.  VALUE IS CONSISTENT WITH PREVIOUSLY REPORTED AND CALLED VALUE. Performed at [facility], [address] [place], [place] [address]    Culture (A)  Final    STAPHYLOCOCCUS AUREUS SUSCEPTIBILITIES PERFORMED ON PREVIOUS CULTURE WITHIN THE LAST 5 DAYS. Performed at [facility], [place] [address], [place], [place] [address]    Report Status 02/12/2019 FINAL  Final  Culture, blood (routine x 2)     Status: Abnormal   Collection Time: 02/09/19  2:38 PM   Specimen: BLOOD LEFT ARM  Result Value Ref Range Status   Specimen Description   Final    BLOOD LEFT ARM Performed at [facility], [address], [place], [place] [address]    Special Requests   Final    BOTTLES DRAWN AEROBIC AND ANAEROBIC Blood Culture adequate volume Performed at [facility], [address] [place], [place] [address]    Culture  Setup Time   Final    GRAM POSITIVE COCCI IN BOTH AEROBIC AND ANAEROBIC BOTTLES CRITICAL RESULT CALLED TO, READ BACK BY AND VERIFIED WITH: [place] [id number] 02/10/2019 HNM Performed at [facility], [place] [address], [place], [place] [address]    Culture METHICILLIN RESISTANT STAPHYLOCOCCUS AUREUS (A)  Final   Report Status 02/12/2019 FINAL  Final   Organism ID, Bacteria METHICILLIN  RESISTANT STAPHYLOCOCCUS AUREUS  Final      Susceptibility   Methicillin resistant staphylococcus aureus - MIC*    CIPROFLOXACIN >=8 RESISTANT Resistant     ERYTHROMYCIN >=8 RESISTANT Resistant     GENTAMICIN <=0.5 SENSITIVE Sensitive     OXACILLIN >=4 RESISTANT Resistant     TETRACYCLINE <=1 SENSITIVE Sensitive     VANCOMYCIN 1 SENSITIVE Sensitive     TRIMETH/SULFA <=10 SENSITIVE Sensitive     CLINDAMYCIN RESISTANT Resistant     RIFAMPIN <=0.5 SENSITIVE Sensitive     Inducible Clindamycin POSITIVE Resistant     *  METHICILLIN RESISTANT STAPHYLOCOCCUS AUREUS  Blood Culture ID Panel (Reflexed)     Status: Abnormal   Collection Time: 02/09/19  2:38 PM  Result Value Ref Range Status   Enterococcus species NOT DETECTED NOT DETECTED Final   Listeria monocytogenes NOT DETECTED NOT DETECTED Final   Staphylococcus species DETECTED (A) NOT DETECTED Final    Comment: CRITICAL RESULT CALLED TO, READ BACK BY AND VERIFIED WITH: Hart Robinsons PHARMD [id number] 02/10/2019 HNM    Staphylococcus aureus (BCID) DETECTED (A) NOT DETECTED Final    Comment: Methicillin (oxacillin)-resistant Staphylococcus aureus (MRSA). MRSA is predictably resistant to beta-lactam antibiotics (except ceftaroline). Preferred therapy is vancomycin unless clinically contraindicated. Patient requires contact precautions if  hospitalized. CRITICAL RESULT CALLED TO, READ BACK BY AND VERIFIED WITH: Hart Robinsons PHARMD [id number] 02/10/2019 HNM    Methicillin resistance DETECTED (A) NOT DETECTED Final    Comment: CRITICAL RESULT CALLED TO, READ BACK BY AND VERIFIED WITH: Hart Robinsons PHARMD [id number] 02/10/2019 HNM    Streptococcus species NOT DETECTED NOT DETECTED Final   Streptococcus agalactiae NOT DETECTED NOT DETECTED Final   Streptococcus pneumoniae NOT DETECTED NOT DETECTED Final   Streptococcus pyogenes NOT DETECTED NOT DETECTED Final   Acinetobacter baumannii NOT DETECTED NOT DETECTED Final   Enterobacteriaceae species NOT DETECTED NOT  DETECTED Final   Enterobacter cloacae complex NOT DETECTED NOT DETECTED Final   Escherichia coli NOT DETECTED NOT DETECTED Final   Klebsiella oxytoca NOT DETECTED NOT DETECTED Final   Klebsiella pneumoniae NOT DETECTED NOT DETECTED Final   Proteus species NOT DETECTED NOT DETECTED Final   Serratia marcescens NOT DETECTED NOT DETECTED Final   Haemophilus influenzae NOT DETECTED NOT DETECTED Final   Neisseria meningitidis NOT DETECTED NOT DETECTED Final   Pseudomonas aeruginosa NOT DETECTED NOT DETECTED Final   Candida albicans NOT DETECTED NOT DETECTED Final   Candida glabrata NOT DETECTED NOT DETECTED Final   Candida krusei NOT DETECTED NOT DETECTED Final   Candida parapsilosis NOT DETECTED NOT DETECTED Final   Candida tropicalis NOT DETECTED NOT DETECTED Final    Comment: Performed at [facility], [place], [place] [address]  SARS CORONAVIRUS 2 (TAT 6-24 HRS) Nasopharyngeal Nasopharyngeal Swab     Status: None   Collection Time: 02/09/19  2:54 PM   Specimen: Nasopharyngeal Swab  Result Value Ref Range Status   SARS Coronavirus 2 NEGATIVE NEGATIVE Final    Comment: (NOTE) SARS-CoV-2 target nucleic acids are NOT DETECTED. The SARS-CoV-2 RNA is generally detectable in upper and lower respiratory specimens during the acute phase of infection. Negative results do not preclude SARS-CoV-2 infection, do not rule out co-infections with other pathogens, and should not be used as the sole basis for treatment or other patient management decisions. Negative results must be combined with clinical observations, patient history, and epidemiological information. The expected result is Negative. Fact Sheet for Patients: SugarRoll.be Fact Sheet for Healthcare Providers: https://www.woods-mathews.com/ This test is not yet approved or cleared by the [place] FDA and  has been authorized for detection and/or diagnosis of SARS-CoV-2  by FDA under an Emergency Use Authorization (EUA). This EUA will remain  in effect (meaning this test can be used) for the duration of the COVID-19 declaration under Section 56 4(b)(1) of the Act, 21 U.S.C. section 360bbb-3(b)(1), unless the authorization is terminated or revoked sooner. Performed at [facility], [place] [address] [place], [place] [address]   MRSA PCR Screening     Status: Abnormal   Collection Time: 02/10/19  5:03 PM   Specimen: Nasopharyngeal  Result Value Ref Range Status   MRSA by PCR POSITIVE (A) NEGATIVE Final    Comment:        The GeneXpert MRSA Assay (FDA approved for NASAL specimens only), is one component of a comprehensive MRSA colonization surveillance program. It is not intended to diagnose MRSA infection nor to guide or monitor treatment for MRSA infections. RESULT CALLED TO, READ BACK BY AND VERIFIED WITH: ALEKSEY FRASER @1839  02/10/19 MJU Performed at [facility], [address] [place], [place] [address]   Culture, blood (routine x 2)     Status: Abnormal   Collection Time: 02/11/19 12:31 PM   Specimen: BLOOD  Result Value Ref Range Status   Specimen Description   Final    BLOOD PORTA CATH Performed at [place] [address] [place], [place] [address]    Special Requests   Final    BOTTLES DRAWN AEROBIC AND ANAEROBIC Blood Culture adequate volume Performed at [facility], [address] [place], [place] [address]    Culture  Setup Time   Final    GRAM POSITIVE COCCI ANAEROBIC BOTTLE ONLY CRITICAL RESULT CALLED TO, READ BACK BY AND VERIFIED WITH: SCOTT HALL AT [id number] 02/12/2019 [facility]  Performed at [facility], [address] [place], [place] [address]    Culture (A)  Final    STAPHYLOCOCCUS AUREUS SUSCEPTIBILITIES PERFORMED ON PREVIOUS CULTURE WITHIN THE LAST 5 DAYS. Performed at [facility], [place] [address] [place], [place] [address]    Report Status 02/13/2019 FINAL  Final   Culture, blood (routine x 2)     Status: None   Collection Time: 02/11/19  3:57 PM   Specimen: BLOOD  Result Value Ref Range Status   Specimen Description BLOOD PORTA CATH  Final   Special Requests   Final    BOTTLES DRAWN AEROBIC AND ANAEROBIC Blood Culture adequate volume   Culture   Final    NO GROWTH 5 DAYS Performed at [facility], [address] [place], [place] [address]    Report Status 02/16/2019 FINAL  Final  CULTURE, BLOOD (ROUTINE X 2) w Reflex to ID Panel     Status: Abnormal   Collection Time: 02/13/19  2:18 PM   Specimen: BLOOD LEFT HAND  Result Value Ref Range Status   Specimen Description BLOOD LEFT HAND  Final   Special Requests   Final    BOTTLES DRAWN AEROBIC AND ANAEROBIC Blood Culture results may not be optimal due to an inadequate volume of blood received in culture bottles   Culture  Setup Time   Final    AEROBIC BOTTLE ONLY GRAM POSITIVE COCCI IN CLUSTERS CRITICAL RESULT CALLED TO, READ BACK BY AND VERIFIED WITH: Herbert Pun AT [id number] ON 02/14/2019 [place]. Performed at [facility], [place] [address] [place], [place] [address]    Culture METHICILLIN RESISTANT STAPHYLOCOCCUS AUREUS (A)  Final   Report Status 02/16/2019 FINAL  Final   Organism ID, Bacteria METHICILLIN RESISTANT STAPHYLOCOCCUS AUREUS  Final      Susceptibility   Methicillin resistant staphylococcus aureus - MIC*    CIPROFLOXACIN >=8 RESISTANT Resistant     ERYTHROMYCIN >=8 RESISTANT Resistant     GENTAMICIN <=0.5 SENSITIVE Sensitive     OXACILLIN >=4 RESISTANT Resistant     TETRACYCLINE <=1 SENSITIVE Sensitive     VANCOMYCIN <=0.5 SENSITIVE Sensitive     TRIMETH/SULFA <=10 SENSITIVE Sensitive  CLINDAMYCIN RESISTANT Resistant     RIFAMPIN <=0.5 SENSITIVE Sensitive     Inducible Clindamycin POSITIVE Resistant     * METHICILLIN RESISTANT STAPHYLOCOCCUS AUREUS  Blood Culture ID Panel (Reflexed)     Status: Abnormal   Collection Time: 02/13/19  2:18 PM  Result Value Ref  Range Status   Enterococcus species NOT DETECTED NOT DETECTED Final   Listeria monocytogenes NOT DETECTED NOT DETECTED Final   Staphylococcus species DETECTED (A) NOT DETECTED Final    Comment: CRITICAL RESULT CALLED TO, READ BACK BY AND VERIFIED WITH: Herbert Pun AT [id number] ON 02/14/2019 [place].    Staphylococcus aureus (BCID) DETECTED (A) NOT DETECTED Final    Comment: Methicillin (oxacillin)-resistant Staphylococcus aureus (MRSA). MRSA is predictably resistant to beta-lactam antibiotics (except ceftaroline). Preferred therapy is vancomycin unless clinically contraindicated. Patient requires contact precautions if  hospitalized. CRITICAL RESULT CALLED TO, READ BACK BY AND VERIFIED WITH: Herbert Pun AT [id number] ON 02/14/2019 [place].    Methicillin resistance DETECTED (A) NOT DETECTED Final    Comment: CRITICAL RESULT CALLED TO, READ BACK BY AND VERIFIED WITH: Herbert Pun AT [id number] ON 02/14/2019 [place].    Streptococcus species NOT DETECTED NOT DETECTED Final   Streptococcus agalactiae NOT DETECTED NOT DETECTED Final   Streptococcus pneumoniae NOT DETECTED NOT DETECTED Final   Streptococcus pyogenes NOT DETECTED NOT DETECTED Final   Acinetobacter baumannii NOT DETECTED NOT DETECTED Final   Enterobacteriaceae species NOT DETECTED NOT DETECTED Final   Enterobacter cloacae complex NOT DETECTED NOT DETECTED Final   Escherichia coli NOT DETECTED NOT DETECTED Final   Klebsiella oxytoca NOT DETECTED NOT DETECTED Final   Klebsiella pneumoniae NOT DETECTED NOT DETECTED Final   Proteus species NOT DETECTED NOT DETECTED Final   Serratia marcescens NOT DETECTED NOT DETECTED Final   Haemophilus influenzae NOT DETECTED NOT DETECTED Final   Neisseria meningitidis NOT DETECTED NOT DETECTED Final   Pseudomonas aeruginosa NOT DETECTED NOT DETECTED Final   Candida albicans NOT DETECTED NOT DETECTED Final   Candida glabrata NOT DETECTED NOT DETECTED Final   Candida krusei NOT DETECTED NOT DETECTED Final    Candida parapsilosis NOT DETECTED NOT DETECTED Final   Candida tropicalis NOT DETECTED NOT DETECTED Final    Comment: Performed at [facility], [address] [place], [place] [address]  CULTURE, BLOOD (ROUTINE X 2) w Reflex to ID Panel     Status: None   Collection Time: 02/13/19  3:09 PM   Specimen: BLOOD  Result Value Ref Range Status   Specimen Description BLOOD BLOOD LEFT HAND  Final   Special Requests   Final    BOTTLES DRAWN AEROBIC AND ANAEROBIC Blood Culture adequate volume   Culture   Final    NO GROWTH 5 DAYS Performed at [facility], [address] [place], [place] [address]    Report Status 02/18/2019 FINAL  Final  Culture, blood (routine x 2)     Status: Abnormal   Collection Time: 02/15/19 12:49 PM   Specimen: BLOOD  Result Value Ref Range Status   Specimen Description   Final    BLOOD LT HAND Performed at [facility], [address] [place], [place] [address]    Special Requests   Final    BOTTLES DRAWN AEROBIC AND ANAEROBIC Blood Culture results may not be optimal due to an inadequate volume of blood received in culture bottles Performed at [facility], [address], [place], [place] [address]    Culture  Setup Time  Final    GRAM POSITIVE COCCI IN BOTH AEROBIC AND ANAEROBIC BOTTLES CRITICAL RESULT CALLED TO, READ BACK BY AND VERIFIED WITHEleonore Chiquito AT [id number] 02/16/2019 [place] Performed at [facility], [address] [place], [place] [address]    Culture (A)  Final    METHICILLIN RESISTANT STAPHYLOCOCCUS AUREUS STAPHYLOCOCCUS SPECIES (COAGULASE NEGATIVE) THE SIGNIFICANCE OF ISOLATING THIS ORGANISM FROM A SINGLE SET OF BLOOD CULTURES WHEN MULTIPLE SETS ARE DRAWN IS UNCERTAIN. PLEASE NOTIFY THE MICROBIOLOGY DEPARTMENT WITHIN ONE WEEK IF SPECIATION AND SENSITIVITIES ARE REQUIRED. Performed at [facility], [place] [address], [place], [place] [address]    Report Status 02/18/2019 FINAL  Final    Organism ID, Bacteria METHICILLIN RESISTANT STAPHYLOCOCCUS AUREUS  Final      Susceptibility   Methicillin resistant staphylococcus aureus - MIC*    CIPROFLOXACIN >=8 RESISTANT Resistant     ERYTHROMYCIN >=8 RESISTANT Resistant     GENTAMICIN <=0.5 SENSITIVE Sensitive     OXACILLIN >=4 RESISTANT Resistant     TETRACYCLINE <=1 SENSITIVE Sensitive     VANCOMYCIN <=0.5 SENSITIVE Sensitive     TRIMETH/SULFA <=10 SENSITIVE Sensitive     CLINDAMYCIN RESISTANT Resistant     RIFAMPIN <=0.5 SENSITIVE Sensitive     Inducible Clindamycin POSITIVE Resistant     * METHICILLIN RESISTANT STAPHYLOCOCCUS AUREUS  Aerobic Culture (superficial specimen)     Status: None   Collection Time: 02/17/19  1:31 PM   Specimen: Heel; Wound  Result Value Ref Range Status   Specimen Description   Final    HEEL Performed at [facility], [address] [place], [place] [address]    Special Requests   Final    NONE Performed at [facility], [address] [place], [place] [address]    Gram Stain   Final    NO WBC SEEN MODERATE GRAM POSITIVE COCCI IN PAIRS    Culture   Final    ABUNDANT METHICILLIN RESISTANT STAPHYLOCOCCUS AUREUS ABUNDANT DIPHTHEROIDS(CORYNEBACTERIUM SPECIES) Standardized susceptibility testing for this organism is not available. Performed at [facility], [place] [address], [place], [place] [address]    Report Status 02/19/2019 FINAL  Final   Organism ID, Bacteria METHICILLIN RESISTANT STAPHYLOCOCCUS AUREUS  Final      Susceptibility   Methicillin resistant staphylococcus aureus - MIC*    CIPROFLOXACIN >=8 RESISTANT Resistant     ERYTHROMYCIN >=8 RESISTANT Resistant     GENTAMICIN <=0.5 SENSITIVE Sensitive     OXACILLIN >=4 RESISTANT Resistant     TETRACYCLINE <=1 SENSITIVE Sensitive     VANCOMYCIN 1 SENSITIVE Sensitive     TRIMETH/SULFA <=10 SENSITIVE Sensitive     CLINDAMYCIN RESISTANT Resistant     RIFAMPIN <=0.5 SENSITIVE Sensitive     Inducible  Clindamycin POSITIVE Resistant     * ABUNDANT METHICILLIN RESISTANT STAPHYLOCOCCUS AUREUS  CULTURE, BLOOD (ROUTINE X 2) w Reflex to ID Panel     Status: None   Collection Time: 02/18/19 11:29 PM   Specimen: BLOOD  Result Value Ref Range Status   Specimen Description BLOOD LEFT ARM  Final   Special Requests   Final    BOTTLES DRAWN AEROBIC AND ANAEROBIC Blood Culture adequate volume   Culture   Final    NO GROWTH 5 DAYS Performed at [facility], [address] [place], [place] [address]    Report Status 02/23/2019 FINAL  Final  CULTURE, BLOOD (ROUTINE X 2) w Reflex to ID Panel     Status:  None   Collection Time: 02/18/19 11:29 PM   Specimen: BLOOD  Result Value Ref Range Status   Specimen Description BLOOD LEFT ARM  Final   Special Requests   Final    BOTTLES DRAWN AEROBIC AND ANAEROBIC Blood Culture adequate volume   Culture   Final    NO GROWTH 5 DAYS Performed at [facility], [address], [place], [place] [address]    Report Status 02/23/2019 FINAL  Final  Aerobic/Anaerobic Culture (surgical/deep wound)     Status: None   Collection Time: 03/04/19 11:04 AM   Specimen: PATH Other; Tissue  Result Value Ref Range Status   Specimen Description   Final    ARTERY Performed at [facility], [address] [place], [place] [address]    Special Requests   Final    RIGHT AV GRAFT AND STENTS CULTURE Performed at [facility], [address] [place], [place] [address]    Gram Stain   Final    RARE WBC PRESENT, PREDOMINANTLY PMN NO ORGANISMS SEEN    Culture   Final    No growth aerobically or anaerobically. Performed at [facility], [place] [address] [place], [place] [address]    Report Status 03/09/2019 FINAL  Final  Aerobic/Anaerobic Culture (surgical/deep wound)     Status: None   Collection Time: 03/04/19 11:29 AM   Specimen: PATH Other; Tissue  Result Value Ref Range Status   Specimen Description   Final     ARTERY Performed at [facility], [address], [place], [place] [address]    Special Requests   Final    FLUID AROUND AV GRAFT Performed at [facility], [address] [place], [place] [address]    Gram Stain   Final    FEW WBC PRESENT, PREDOMINANTLY PMN NO ORGANISMS SEEN    Culture   Final    No growth aerobically or anaerobically. Performed at [facility], [place] [address] [place], [place] [address]    Report Status 03/09/2019 FINAL  Final    Coagulation Studies: Recent Labs    03/12/19 0536 03/13/19 0618  LABPROT 15.2 16.7*  INR 1.2 1.4*    Imaging: No results found.  Medications:  I have reviewed the patient's current medications. Scheduled: . apixaban  5 mg Oral BID  . aspirin  81 mg Oral Daily  . B-complex with vitamin C  1 tablet Per Tube Daily  . Chlorhexidine Gluconate Cloth  6 each Topical Q0600  . epoetin (EPOGEN/PROCRIT) injection  4,000 Units Intravenous Q M,W,F-HD  . feeding supplement (PRO-STAT SUGAR FREE 64)  30 mL Per Tube Daily  . fluticasone  2 spray Each Nare Daily  . free water  20 mL Per Tube Q4H  . insulin aspart  0-15 Units Subcutaneous Q4H  . levothyroxine  88 mcg Oral QAC breakfast  . mouth rinse  15 mL Mouth Rinse BID  . pantoprazole (PROTONIX) IV  40 mg Intravenous Q12H  . psyllium  1 packet Per Tube BID  . venlafaxine  75 mg Oral BID WC  . zinc sulfate  220 mg Oral Daily    Assessment/Plan: 64 year old female with multiple medical problems admitted with AMS.  Appears to have been having some slow decline over the past year that has been complicated by her COVID diagnosis over the summer and now her recent admission due to MRSA bacteremia and uremia.  Patient has improved from her uremia and bacteremia  but remains unchanged cognitively.  MRI and EEG X 2 do not point to an etiology for her mental status.  Currently afebrile and with normal white blood cell count and imaging of the cervical, thoracic and  lumbar spines, therefore do not suspect infection as etiology.  Patient may very well have a severe encephalopathy where improvement is lagging behind improvement in her lab work.  EEG has shown improvement with resolution of the triphasic waves.  TSH and B12 pending but would rule out for thiamine deficiency as well and may benefit from its supplementation while awaiting results.    Recommendations: 1. Thiamine level 2. Thiamine 500mg  IV q 8 hours for 48 hours.  Will re-evaluate for need for further IV dosing after that time.       LOS: 33 days   Alexis Goodell, MD Neurology [phone] 03/14/2019  3:37 PM

## 2019-03-14 NOTE — Progress Notes (Signed)
PROGRESS NOTE    Jamie Arias  [id number] DOB: 11/05/1955 DOA: 02/09/2019 PCP: [name], MD   Brief Narrative:  [name] is a 64 y.o. white female with end-stage renal disease on hemodialysis, atrial fibrillation, chronic diastolic congestive heart failure, coronary artery disease status post CABG, diabetes mellitus type II, hypertension, hyperlipidemia, hypothyroidism, Covid positive in July 2020, recent nursing home stay in [place] Prolonged hospitalization for MRSA bacteremia/sepsis and now with acute encephalopathy.  Subjective: Patient remained encephalopathic.  Husband was at bedside.  Assessment & Plan:   Principal Problem:   Sepsis due to methicillin resistant Staphylococcus aureus (MRSA) ([place]) Active Problems:   Type 2 diabetes mellitus with ESRD (end-stage renal disease) (HCC)   Atrial fibrillation, chronic (HCC)   Hypotension   Chronic diastolic CHF (congestive heart failure) (HCC)   ESRD (end stage renal disease) (HCC)   Altered level of consciousness   Decubitus ulcer of heel, bilateral, stage 2 (HCC)   MRSA bacteremia   Arm DVT (deep venous thromboembolism), acute, left (HCC)   Acute encephalopathy   Acute metabolic encephalopathy   Acute blood loss anemia   AF (paroxysmal atrial fibrillation) ([place])   Diarrhea   Oropharyngeal dysphagia   Abscess  Sepsis secondary to MRSA bacteremia.  No obvious source.  Right arm graft was removed on 03/04/2019. -Continue with vancomycin for 6-week.  Bleeding from catheter site with multiple ecchymosis.  No active bleeding today.  Warfarin and heparin was discontinued yesterday because of concern for necrosis of skin. -She was started on Eliquis today.  Vaginal discharge.  There was some nursing concern of vaginal discharge today. -Patient needs wet mount-apparently has to consult GYN. -GYN was consulted.  Anemia.  Hemoglobin of 7.4 today.  Iron studies consistent with anemia of chronic illness.   MCV elevated. -We will check B12 and RBC folate. -Continue EPO with dialysis.  Hypotension.  Blood pressure elevated now. -Discontinue midodrine and monitor.  ESRD ([facility]).  Patient received her dialysis yesterday.  Currently using tunneled catheter as graft was removed due to MRSA bacteremia. -Continue with scheduled dialysis.  Acute metabolic encephalopathy.  Mental status still impaired.  MRI of brain and EEG negative.  TSH was normal.  Patient remained afebrile with no leukocytosis. -Repeat TSH. -Consult neurology again for a possible LP. Overall prognosis seems poor.  Left arm DVT.  Patient was on Heparin.  Recent diagnosis of left arm DVT on 03/03/2019. -Discontinue heparin and Coumadin due to concern for Coumadin toxicity/side effect due to her skin necrotic lesions. -Start her on renally dosed Eliquis from tomorrow.  Hypothyroidism. -Continue with Synthroid.  Paroxysmal A. Fib.. Currently in sinus. -Continue amiodarone. -Continue anticoagulation with Coumadin.  Type 2 diabetes. -Continue SSI.  Chronic diastolic heart failure.  Volume being managed by dialysis.  Bilateral decubitus ulcers.  Present on admission. -Continue with wound care.  Nutrition.  PEG placed 03/03/2019.  Continue tube feeding.  Acute hypoxic respiratory failure.  Resolved.  Patient was intubated initially, extubated on 03/05/2019.  Objective: Vitals:   03/13/19 1541 03/13/19 1954 03/14/19 0411 03/14/19 1140  BP: (!) 136/47 137/68 (!) 157/66 135/62  Pulse: 86 87 86 83  Resp: 18 16 16 18   Temp: 98.4 F (36.9 C) 98.8 F (37.1 C) 98.6 F (37 C) 98.7 F (37.1 C)  TempSrc: Oral Oral Oral Oral  SpO2: 94% 99% 99% 100%  Weight:      Height:        Intake/Output Summary (Last 24 hours)  at 03/14/2019 1452 Last data filed at 03/14/2019 0508 Gross per 24 hour  Intake 739 ml  Output -  Net 739 ml   Filed Weights   03/02/19 0930 03/06/19 1500 03/10/19 1220  Weight: 75.5 kg 83 kg 87.3 kg     Examination:  General exam: Chronically ill-appearing lady, minimal response to painful stimuli, in no acute distress. Respiratory system: Clear to auscultation. Respiratory effort normal. Cardiovascular system: S1 & S2 heard, RRR. No JVD, murmurs, rubs, gallops or clicks. Gastrointestinal system: Soft, nontender, nondistended, bowel sounds positive. Central nervous system: Unable to assess as patient is minimally responsive and not following any commands. Extremities: No edema, no cyanosis, pulses intact and symmetrical. Skin: Decubitus ulcers on both heels.  Multiple ecchymosis on all extremities and upper chest. Psychiatry: Judgement and insight appear impaired.  DVT prophylaxis: Eliquis Code Status: DNR Family Communication: Husband was updated at bedside. Disposition Plan: Waiting for bed offer at [facility].  Consultants:   Nephrology  Infectious disease  Vascular surgery  Neurology  Critical care specialist   Procedures:  Antimicrobials:  Vancomycin.  Data Reviewed: I have personally reviewed following labs and imaging studies  CBC: Recent Labs  Lab 03/10/19 0600 03/11/19 0502 03/12/19 0536 03/13/19 0618 03/14/19 0502  WBC 9.1 9.8 10.0 8.9 9.1  HGB 7.4* 7.2* 7.8* 8.5* 7.4*  HCT 23.9* 22.6* 24.7* 27.4* 24.2*  MCV 96.4 97.0 98.0 98.9 100.0  PLT 261 245 279 262 [id number]   Basic Metabolic Panel: Recent Labs  Lab 03/08/19 0700 03/09/19 0528 03/11/19 0502 03/14/19 0502  NA 135 134* 137 138  K 3.7 3.8 3.4* 2.8*  CL 100 101 102 104  CO2 26 23 29 30   GLUCOSE 122* 141* 151* 171*  BUN 30* 39* 33* 21  CREATININE 2.70* 3.29* 2.97* 1.90*  CALCIUM 8.1* 8.4* 8.4* 8.1*  MG 2.2 2.2 2.1  --   PHOS  --   --   --  2.0*   GFR: Estimated Creatinine Clearance: 31.5 mL/min (A) (by C-G formula based on SCr of 1.9 mg/dL (H)). Liver Function Tests: Recent Labs  Lab 03/09/19 0528 03/11/19 0502 03/14/19 0502  AST 18 17  --   ALT 7 7  --   ALKPHOS 68 74  --   BILITOT  0.9 0.7  --   PROT 4.9* 5.0*  --   ALBUMIN 1.9* 1.8* 1.8*   No results for input(s): LIPASE, AMYLASE in the last 168 hours. No results for input(s): AMMONIA in the last 168 hours. Coagulation Profile: Recent Labs  Lab 03/09/19 0528 03/11/19 0502 03/12/19 0536 03/13/19 0618  INR 1.1 1.2 1.2 1.4*   Cardiac Enzymes: No results for input(s): CKTOTAL, CKMB, CKMBINDEX, TROPONINI in the last 168 hours. BNP (last 3 results) No results for input(s): PROBNP in the last 8760 hours. HbA1C: No results for input(s): HGBA1C in the last 72 hours. CBG: Recent Labs  Lab 03/13/19 1956 03/14/19 0034 03/14/19 0409 03/14/19 0722 03/14/19 1138  GLUCAP 117* 245* 186* 125* 156*   Lipid Profile: No results for input(s): CHOL, HDL, LDLCALC, TRIG, CHOLHDL, LDLDIRECT in the last 72 hours. Thyroid Function Tests: No results for input(s): TSH, T4TOTAL, FREET4, T3FREE, THYROIDAB in the last 72 hours. Anemia Panel: Recent Labs    03/14/19 0502  FERRITIN 1,664*  TIBC NOT CALCULATED  IRON 54   Sepsis Labs: No results for input(s): PROCALCITON, LATICACIDVEN in the last 168 hours.  No results found for this or any previous visit (from the past 240 hour(s)).  Radiology Studies: No results found.  Scheduled Meds: . apixaban  5 mg Oral BID  . aspirin  81 mg Oral Daily  . B-complex with vitamin C  1 tablet Per Tube Daily  . Chlorhexidine Gluconate Cloth  6 each Topical Q0600  . epoetin (EPOGEN/PROCRIT) injection  4,000 Units Intravenous Q M,W,F-HD  . feeding supplement (PRO-STAT SUGAR FREE 64)  30 mL Per Tube Daily  . fluticasone  2 spray Each Nare Daily  . free water  20 mL Per Tube Q4H  . insulin aspart  0-15 Units Subcutaneous Q4H  . levothyroxine  88 mcg Oral QAC breakfast  . mouth rinse  15 mL Mouth Rinse BID  . midodrine  10 mg Oral TID WC  . pantoprazole (PROTONIX) IV  40 mg Intravenous Q12H  . psyllium  1 packet Per Tube BID  . venlafaxine  75 mg Oral BID WC  . zinc sulfate  220 mg  Oral Daily   Continuous Infusions: . sodium chloride 250 mL (03/13/19 1951)  . feeding supplement (OSMOLITE 1.5 CAL) 1,000 mL (03/12/19 0947)  . potassium PHOSPHATE IVPB (in mmol) 30 mmol (03/14/19 1101)  . vancomycin 750 mg (03/13/19 1958)     LOS: 33 days   Time spent: 35 min.   Lorella Nimrod, MD [facility] Pager [phone]  If 7PM-7AM, please contact night-coverage [email or web address] Password [facility] 03/14/2019, 2:52 PM   This record has been created using Systems analyst. Errors have been sought and corrected,but may not always be located. Such creation errors do not reflect on the standard of care.

## 2019-03-14 NOTE — Consult Note (Signed)
ANTICOAGULATION CONSULT NOTE  Pharmacy Consult for apixaban Indication: DVT  Patient Measurements: Height: 5' 4.02" (162.6 cm) Weight: 192 lb 7.4 oz (87.3 kg) IBW/kg (Calculated) : 54.74 Heparin Dosing Weight: 71 kg  Vital Signs: Temp: 98.6 F (37 C) (01/09 0411) Temp Source: Oral (01/09 0411) BP: 157/66 (01/09 0411) Pulse Rate: 86 (01/09 0411)  Labs: Recent Labs    03/12/19 0536 03/13/19 0618 03/14/19 0502  HGB 7.8* 8.5* 7.4*  HCT 24.7* 27.4* 24.2*  PLT 279 262 235  LABPROT 15.2 16.7*  --   INR 1.2 1.4*  --   HEPARINUNFRC 0.54 0.64  --   CREATININE  --   --  1.90*   Estimated Creatinine Clearance: 31.5 mL/min (A) (by C-G formula based on SCr of 1.9 mg/dL (H)).  Medical History: Past Medical History:  Diagnosis Date  . Anemia   . Chronic diastolic CHF (congestive heart failure) ([place])    a. Due to ischemic cardiomyopathy. EF as low as 35%, improved to normal s/p CABG; b. echo 07/06/13: EF 55-60%, no RWMAs, mod TR, trivial pericardial effusion not c/w tamponade physiology;  c. 10/2015 Echo: EF 65%, Gr1 DD, triv AI, mild MR, mildly dil LA, mod TR, PASP 72mmHg.  Marland Kitchen Coronary artery disease    a. NSTEMI 06/2013; b.cath: severe three-vessel CAD w/ EF 30% & mild-mod MR; c. s/p 3 vessel CABG 07/02/13 (LIMA-LAD, SVG-OM, and SVG-RPDA);  d. 10/2015 MV: no ischemia/infarct.  . Diabetes mellitus without complication ([place])   . Diabetic neuropathy ([place])   . Dialysis patient [facility])    MWF  . ESRD (end stage renal disease) ([place])    a. 12/2015 initiated - [facility] dialysis.  Marland Kitchen GERD (gastroesophageal reflux disease)   . Hyperlipidemia   . Hypertension   . Hypothyroidism   . Myocardial infarction ([place]) 2015  . Neuropathy   . Pleural effusion 2015  . Pulmonary hypertension ([place])   . Renal insufficiency   . Wears dentures    full lower    Medications:  Scheduled:  . apixaban  5 mg Oral BID  . aspirin  81 mg Oral Daily  . B-complex with vitamin C  1 tablet Per Tube Daily  . Chlorhexidine  Gluconate Cloth  6 each Topical Q0600  . epoetin (EPOGEN/PROCRIT) injection  4,000 Units Intravenous Q M,W,F-HD  . feeding supplement (PRO-STAT SUGAR FREE 64)  30 mL Per Tube Daily  . fluticasone  2 spray Each Nare Daily  . free water  20 mL Per Tube Q4H  . insulin aspart  0-15 Units Subcutaneous Q4H  . levothyroxine  88 mcg Oral QAC breakfast  . mouth rinse  15 mL Mouth Rinse BID  . midodrine  10 mg Oral TID WC  . pantoprazole (PROTONIX) IV  40 mg Intravenous Q12H  . psyllium  1 packet Per Tube BID  . venlafaxine  75 mg Oral BID WC  . zinc sulfate  220 mg Oral Daily    Assessment: 64 y.o. female admitted on 02/09/2019 with MRSA  bacteremia. She was on apixaban PTA but last dose was prior to admission (12/7). [place] 12/15 revealed age-indeterminate short segment occlusive DVT involving the mid humeral aspect of one of the paired brachial veins. On 12/20 a femoral central line was placed.  Nurse reported bleeding around femoral line 1/8. Per hospitalist, stop heparin and warfarin and start Eliquis treatment 1/9.   Goal of Therapy:  INR: 2-3 Monitor platelets by anticoagulation protocol: Yes   Plan:  Per discussion with MD, will  defer treatment dose 10 mg BID x 7 days and start with the 5 mg BID.  Dorena Bodo, PharmD 03/14/2019 7:37 AM

## 2019-03-15 DIAGNOSIS — N898 Other specified noninflammatory disorders of vagina: Secondary | ICD-10-CM

## 2019-03-15 LAB — GLUCOSE, CAPILLARY
Glucose-Capillary: 134 mg/dL — ABNORMAL HIGH (ref 70–99)
Glucose-Capillary: 141 mg/dL — ABNORMAL HIGH (ref 70–99)
Glucose-Capillary: 157 mg/dL — ABNORMAL HIGH (ref 70–99)
Glucose-Capillary: 165 mg/dL — ABNORMAL HIGH (ref 70–99)
Glucose-Capillary: 178 mg/dL — ABNORMAL HIGH (ref 70–99)
Glucose-Capillary: 184 mg/dL — ABNORMAL HIGH (ref 70–99)

## 2019-03-15 LAB — CBC
HCT: 24.1 % — ABNORMAL LOW (ref 36.0–46.0)
Hemoglobin: 7.5 g/dL — ABNORMAL LOW (ref 12.0–15.0)
MCH: 31.1 pg (ref 26.0–34.0)
MCHC: 31.1 g/dL (ref 30.0–36.0)
MCV: 100 fL (ref 80.0–100.0)
Platelets: 263 10*3/uL (ref 150–400)
RBC: 2.41 MIL/uL — ABNORMAL LOW (ref 3.87–5.11)
RDW: 22.1 % — ABNORMAL HIGH (ref 11.5–15.5)
WBC: 10.4 10*3/uL (ref 4.0–10.5)
nRBC: 0 % (ref 0.0–0.2)

## 2019-03-15 LAB — RENAL FUNCTION PANEL
Albumin: 1.8 g/dL — ABNORMAL LOW (ref 3.5–5.0)
Anion gap: 11 (ref 5–15)
BUN: 34 mg/dL — ABNORMAL HIGH (ref 8–23)
CO2: 26 mmol/L (ref 22–32)
Calcium: 8.1 mg/dL — ABNORMAL LOW (ref 8.9–10.3)
Chloride: 101 mmol/L (ref 98–111)
Creatinine, Ser: 2.7 mg/dL — ABNORMAL HIGH (ref 0.44–1.00)
GFR calc Af Amer: 21 mL/min — ABNORMAL LOW (ref >60)
GFR calc non Af Amer: 18 mL/min — ABNORMAL LOW (ref >60)
Glucose, Bld: 203 mg/dL — ABNORMAL HIGH (ref 70–99)
Phosphorus: 4 mg/dL (ref 2.5–4.6)
Potassium: 3.8 mmol/L (ref 3.5–5.1)
Sodium: 138 mmol/L (ref 135–145)

## 2019-03-15 LAB — TSH: TSH: 17.828 u[IU]/mL — ABNORMAL HIGH (ref 0.350–4.500)

## 2019-03-15 LAB — VITAMIN B12: Vitamin B-12: 437 pg/mL (ref 180–914)

## 2019-03-15 MED ORDER — TRAMADOL HCL 50 MG PO TABS
50.0000 mg | ORAL_TABLET | Freq: Two times a day (BID) | ORAL | Status: DC | PRN
  Administered 2019-04-04 – 2019-04-20 (×12): 50 mg via ORAL
  Filled 2019-03-15 (×13): qty 1

## 2019-03-15 MED ORDER — ACETAMINOPHEN 325 MG PO TABS
650.0000 mg | ORAL_TABLET | ORAL | Status: DC | PRN
  Administered 2019-04-04 – 2019-04-17 (×7): 650 mg via ORAL
  Filled 2019-03-15 (×9): qty 2

## 2019-03-15 MED ORDER — LEVOTHYROXINE SODIUM 100 MCG PO TABS
100.0000 ug | ORAL_TABLET | Freq: Every day | ORAL | Status: DC
  Administered 2019-03-16: 100 ug via ORAL
  Filled 2019-03-15: qty 1

## 2019-03-15 MED ORDER — THIAMINE HCL 100 MG/ML IJ SOLN
500.0000 mg | Freq: Three times a day (TID) | INTRAVENOUS | Status: AC
  Administered 2019-03-15 – 2019-03-17 (×6): 500 mg via INTRAVENOUS
  Filled 2019-03-15 (×6): qty 5

## 2019-03-15 NOTE — Consult Note (Signed)
GYNECOLOGY CONSULT NOTE  GYN Consultation  Attending Provider: Lorella Nimrod, MD   Jamie Arias [id number] 03/15/2019 6:34 PM    Reason for Consultation:   Jamie Arias is a 64 y.o. postmenopausal female seen at the request of Dr. Lorella Nimrod for evaluation of abnormal vaginal discharge.    History of Present Ilness:   The patient is currently on hospital day #34 who has a history of end-stage renal disease on hemodialysis, atrial fibrillation, chronic diastolic congestive heart failure, coronary artery disease status post CABG, diabetes mellitus Type II, hypertension, hyperlipidemia, hypothyroidism, covid positive June 2020, recent nursing home stay in [place], currently with a prolonged hospitalization for MRSA bacteremia/sepsis and now with acute encephalopathy.   I was consulted due to an abnormal discharge noted by her nurse two days ago.  The nurse notes that the patient had a clean pad beneath her when during a shift of the patient a "copious" amount of light green discharge was noted.  She has a rectal tube. However, the RN reports that the stool consistency and color are inconsistent with the visualized discharge.  Her nurse of today states that she has not noted a similar discharge today. Though a milky-white discharge was noted.   The patient is non-verbal and non-communicative due to her current medical conditions.  Her husband was present during my exam and he verbally consented to me performing a limited pelvic exam.    The entire rest of the medical history is obtained as recorded in the patient's medical chart.  I have reviewed the chart in detail.   Past Medical History:  Diagnosis Date  . Anemia   . Chronic diastolic CHF (congestive heart failure) ([place])    a. Due to ischemic cardiomyopathy. EF as low as 35%, improved to normal s/p CABG; b. echo 07/06/13: EF 55-60%, no RWMAs, mod TR, trivial pericardial effusion not c/w tamponade physiology;  c. 10/2015 Echo: EF 65%, Gr1 DD,  triv AI, mild MR, mildly dil LA, mod TR, PASP 25mHg.  .Marland KitchenCoronary artery disease    a. NSTEMI 06/2013; b.cath: severe three-vessel CAD w/ EF 30% & mild-mod MR; c. s/p 3 vessel CABG 07/02/13 (LIMA-LAD, SVG-OM, and SVG-RPDA);  d. 10/2015 MV: no ischemia/infarct.  . Diabetes mellitus without complication ([place]   . Diabetic neuropathy ([facility]   . Dialysis patient ([facility]    MWF  . ESRD (end stage renal disease) ([place]    a. 12/2015 initiated - mwf dialysis.  .Marland KitchenGERD (gastroesophageal reflux disease)   . Hyperlipidemia   . Hypertension   . Hypothyroidism   . Myocardial infarction ([facility] 2015  . Neuropathy   . Pleural effusion 2015  . Pulmonary hypertension ([place]   . Renal insufficiency   . Wears dentures    full lower   Past Surgical History:  Procedure Laterality Date  . A/V FISTULAGRAM Right 12/17/2016   Procedure: A/V Fistulagram;  Surgeon: DAlgernon Huxley MD;  [facility];  Service: Cardiovascular;  Laterality: Right;  . A/V FISTULAGRAM Right 01/07/2017   Procedure: A/V Fistulagram;  Surgeon: DAlgernon Huxley MD;  [facility];  Service: Cardiovascular;  Laterality: Right;  . A/V FISTULAGRAM Right 12/03/2017   Procedure: A/V FISTULAGRAM;  Surgeon: SKatha Cabal MD;  [facility];  Service: Cardiovascular;  Laterality: Right;  . A/V FISTULAGRAM Right 02/23/2019   Procedure: A/V Fistulagram;  Surgeon: DAlgernon Huxley MD;  [facility];  Service: Cardiovascular;  Laterality: Right;  . A/V SHUNT INTERVENTION N/A 02/22/2017   Procedure: A/V SHUNT INTERVENTION;  Surgeon: Algernon Huxley, MD;  Location: [place] [facility];  Service: Cardiovascular;  Laterality: N/A;  . AV FISTULA PLACEMENT Right 02/03/2016   Procedure: INSERTION OF ARTERIOVENOUS (AV) GORE-TEX GRAFT ARM ( BRACH / AXILLARY );  Surgeon: Katha Cabal, MD;  Location: [facility];  Service: Vascular;  Laterality: Right;  . [place] REMOVAL Right 03/04/2019   Procedure:  REMOVAL OF ARTERIOVENOUS GORETEX GRAFT ([place]);  Surgeon: Algernon Huxley, MD;  Location: [facility];  Service: Vascular;  Laterality: Right;  . CARDIAC CATHETERIZATION    . CATARACT EXTRACTION Bilateral   . CHOLECYSTECTOMY N/A 12/09/2014   Procedure: LAPAROSCOPIC CHOLECYSTECTOMY;  Surgeon: Marlyce [name], MD;  Location: [facility];  Service: General;  Laterality: N/A;  . CORONARY ARTERY BYPASS GRAFT N/A 07/02/2013   Procedure: CORONARY ARTERY BYPASS GRAFTING (CABG);  Surgeon: Ivin [name], MD;  Location: [place];  Service: Open Heart Surgery;  Laterality: N/A;  CABG x three, using left internal mammary artery and right leg greater saphenous vein harvested endoscopically  . ESOPHAGOGASTRODUODENOSCOPY (EGD) WITH PROPOFOL N/A 11/24/2015   Procedure: ESOPHAGOGASTRODUODENOSCOPY (EGD) WITH PROPOFOL;  Surgeon: [name], MD;  Location: [place];  Service: Endoscopy;  Laterality: N/A;  Diabetic - insulin  . EYE SURGERY Bilateral    Cataract Extraction with IOL  . INTRAOPERATIVE TRANSESOPHAGEAL ECHOCARDIOGRAM N/A 07/02/2013   Procedure: INTRAOPERATIVE TRANSESOPHAGEAL ECHOCARDIOGRAM;  Surgeon: Ivin [name], MD;  Location: [place];  Service: Open Heart Surgery;  Laterality: N/A;  . PEG PLACEMENT N/A 03/03/2019   Procedure: PERCUTANEOUS ENDOSCOPIC GASTROSTOMY (PEG) PLACEMENT;  Surgeon: Virgel Manifold, MD;  Location: [facility];  Service: Endoscopy;  Laterality: N/A;  . PERIPHERAL VASCULAR CATHETERIZATION Right 12/06/2015   Procedure: Dialysis/Perma Catheter Insertion;  Surgeon: Katha Cabal, MD;  Location: [place] [facility];  Service: Cardiovascular;  Laterality: Right;  . PERIPHERAL VASCULAR THROMBECTOMY Right 09/28/2016   Procedure: Peripheral Vascular Thrombectomy;  Surgeon: Katha Cabal, MD;  Location: [place] [facility];  Service: Cardiovascular;  Laterality: Right;  . PERIPHERAL VASCULAR THROMBECTOMY Right 05/20/2018   Procedure: PERIPHERAL VASCULAR THROMBECTOMY;   Surgeon: Katha Cabal, MD;  Location: [place] [facility];  Service: Cardiovascular;  Laterality: Right;  . PORTA CATH REMOVAL N/A 06/01/2016   Procedure: Glori [name] Cath Removal;  Surgeon: Katha Cabal, MD;  Location: [place] [facility];  Service: Cardiovascular;  Laterality: N/A;  . THORACENTESIS Left 2015   Allergies  Allergen Reactions  . Nsaids Other (See Comments)    Contraindicated due to kidney disease.  Marland Kitchen Doxycycline Other (See Comments)    tremor   Prior to Admission medications   Medication Sig Start Date End Date Taking? Authorizing Provider  ACCU-CHEK FASTCLIX LANCETS MISC Check blood glucose twice daily, diagnosis code E11.9 12/13/16  Yes [name], MD  acetaminophen (TYLENOL) 325 MG tablet Take 650 mg by mouth daily as needed for moderate pain or headache.    Yes [provider]  Alirocumab (PRALUENT) 75 MG/ML SOAJ Inject 75 mg into the skin every 14 (fourteen) days. 04/04/18  Yes Wellington Hampshire, MD  Amino Acids-Protein Hydrolys (FEEDING SUPPLEMENT, PRO-STAT SUGAR FREE 64,) LIQD Take 30 mLs by mouth 3 (three) times daily between meals. 09/09/18  Yes Hosie Poisson, MD  amiodarone (PACERONE) 200 MG tablet Take 200 mg by mouth daily.   Yes [provider]  aspirin EC 81 MG tablet Take 81 mg  by mouth daily.   Yes [provider]  atorvastatin (LIPITOR) 40 MG tablet Take 40 mg by mouth daily.   Yes [provider]  Blood Glucose Monitoring Suppl (ACCU-CHEK AVIVA PLUS) w/Device KIT Use as directed to test blood sugar up to three times daily 05/06/17  Yes [name], MD  Calcium Acetate 667 MG TABS Take 2,001 mg by mouth 3 (three) times daily with meals.   Yes [provider]  ELIQUIS 5 MG TABS tablet Take 5 mg by mouth 2 (two) times daily.  01/20/19  Yes [provider]  ferrous sulfate 325 (65 FE) MG tablet Take 325 mg by mouth daily with breakfast.   Yes [provider]  FLUoxetine (PROZAC) 20 MG  capsule Take 20 mg by mouth daily.   Yes [provider]  fluticasone (FLONASE) 50 MCG/ACT nasal spray Place 2 sprays into both nostrils daily. Patient taking differently: Place 2 sprays into both nostrils daily as needed for allergies or rhinitis.  09/17/17  Yes [name], MD  furosemide (LASIX) 20 MG tablet Take 40 mg by mouth every other day. Non-dialysis days    Yes [provider]  gabapentin (NEURONTIN) 300 MG capsule Take 300 mg by mouth 2 (two) times daily.  01/20/19  Yes [provider]  glucose blood (ACCU-CHEK AVIVA) test strip Use as instructed to test blood sugar up to 3 times daily 05/06/17  Yes [name], MD  Insulin Glargine (BASAGLAR KWIKPEN) 100 UNIT/ML SOPN Inject 18 Units into the skin daily.   Yes [provider]  Lancets (ACCU-CHEK SOFT TOUCH) lancets Use as instructed 05/06/17  Yes [name], MD  levothyroxine (SYNTHROID) 88 MCG tablet Take 88 mcg by mouth daily before breakfast.   Yes [provider]  lidocaine-prilocaine (EMLA) cream Apply 1 application topically as needed (port access).   Yes [provider]  midodrine (PROAMATINE) 10 MG tablet Take 1 tablet (10 mg total) by mouth 3 (three) times daily with meals. Patient taking differently: Take 10 mg by mouth 3 (three) times daily as needed (low BP).  09/09/18  Yes Hosie Poisson, MD  multivitamin (RENA-VIT) TABS tablet Take 1 tablet by mouth at bedtime. 09/09/18  Yes Hosie Poisson, MD  nitroGLYCERIN (NITROSTAT) 0.4 MG SL tablet DISSOLVE ONE TABLET UNDER THE TONGUE EVERY 5 MINUTES AS NEEDED FOR CHEST PAIN.  DO NOT EXCEED A TOTAL OF 3 DOSES IN 15 MINUTES Patient taking differently: Place 0.4 mg under the tongue every 5 (five) minutes as needed for chest pain. DISSOLVE ONE TABLET UNDER THE TONGUE EVERY 5 MINUTES AS NEEDED FOR CHEST PAIN.  DO NOT EXCEED A TOTAL OF 3 DOSES IN 15 MINUTES 08/18/18  Yes Wellington Hampshire, MD  ondansetron (ZOFRAN-ODT) 4 MG  disintegrating tablet Take 1 tablet (4 mg total) by mouth every 8 (eight) hours as needed for nausea or vomiting. appt with PCP further refills Dr. Caryl Bis 01/28/19  Yes McLean-Scocuzza, Nino Glow, MD  pantoprazole (PROTONIX) 40 MG tablet Take 40 mg by mouth daily. 01/20/19  Yes [provider]  vitamin C (VITAMIN C) 500 MG tablet Take 1 tablet (500 mg total) by mouth daily. 09/10/18  Yes Hosie Poisson, MD  zinc sulfate 220 (50 Zn) MG capsule Take 1 capsule (220 mg total) by mouth daily. 09/10/18  Yes Hosie Poisson, MD  mirtazapine (REMERON) 15 MG tablet Take 1 tablet (15 mg total) by mouth at bedtime. Patient not taking: Reported on 02/09/2019 09/11/18  Mercy Riding, MD   Social History:  She  reports that she has never smoked. She has never used smokeless tobacco. She reports that she does not drink alcohol or use drugs.  Family History:  family history includes COPD in her mother; Cancer in her mother; Diabetes in her father and paternal grandfather; Heart disease in her maternal grandmother; Pulmonary embolism in her father.   Review of Systems:  Unable to obtain due to patient being non-verbal.  Objective    BP (!) 151/62 (BP Location: Right Leg)   Pulse 80   Temp 99.1 F (37.3 C) (Oral)   Resp 20   Ht 5' 4.02" (1.626 m)   Wt 87.3 kg   SpO2 99%   BMI 33.02 kg/m  Physical Exam  Physical Exam Constitutional:      Appearance: She is obese. She is ill-appearing.  Genitourinary:     Pelvic exam was performed with patient in the lithotomy position.     Vulva normal.     Genitourinary Comments: The exam is limited to an external exam only.  The labia are retracted to visualize the introitus.  No discharge noted.  Two cotton-tipped swabs introduced into the vagina about 1.5 inches for a wet prep.  The swabs are removed and no abnormal discharge noted.   Pulmonary:     Effort: Pulmonary effort is normal. No respiratory distress.  Abdominal:     Palpations: Abdomen is soft.      Tenderness: There is no abdominal tenderness. There is no guarding.  Musculoskeletal:     Comments: SCDs in place  Neurological:     Comments: Patient not oriented. She is not communicative to my introduction of myself and any questions I ask.  Skin:    General: Skin is warm and dry.    Female chaperone present for pelvic exam:    Laboratory Results:   Lab Results  Component Value Date   WBC 10.4 03/15/2019   RBC 2.41 (L) 03/15/2019   HGB 7.5 (L) 03/15/2019   HCT 24.1 (L) 03/15/2019   PLT 263 03/15/2019   NA 138 03/15/2019   K 3.8 03/15/2019   CREATININE 2.70 (H) 03/15/2019     Assessment & Recommendations   Jamie Arias is a 64 y.o. postmenopausal female with a current, prolonged hospitalization for MRSA bacteremia/sepsis and now with acute encephalopathy. She is seen in consultation due to an abnormal discharge noted yesterday.    Recommendations:  1.  Wet prep collected by me and sent to lab.  Recommend treatment based on results.  2.  Otherwise, she has no notable gynecologic issues reported.   3. Will likely sign off after return of wet prep, unless new issues develop.   4. We are available for further consultation, should any new issues arise.   Prentice Docker, MD 03/15/2019 6:34 PM

## 2019-03-15 NOTE — Progress Notes (Signed)
[facility]  ROUNDING NOTE   Subjective:   No change in mental status. Husband at bedside.  Thiamine started by Neurology.   Objective:  Vital signs in last 24 hours:  Temp:  [98.7 F (37.1 C)-99.4 F (37.4 C)] 99.1 F (37.3 C) (01/10 0416) Pulse Rate:  [80-84] 80 (01/10 0416) Resp:  [16-20] 20 (01/10 0416) BP: (135-168)/(62-65) 151/62 (01/10 0416) SpO2:  [99 %-100 %] 99 % (01/10 0416)  Weight change:  Filed Weights   03/02/19 0930 03/06/19 1500 03/10/19 1220  Weight: 75.5 kg 83 kg 87.3 kg    Intake/Output: I/O last 3 completed shifts: In: 2391.8 [NG/GT:2391.8] Out: 400 [Stool:400]   Intake/Output this shift:  No intake/output data recorded.  Physical Exam: General: NAD, laying in bed  HEENT [place]/AT   Lungs:  Scatterd rhonchi, normal effort  Heart: Regular rate and rhythm  Abdomen:  Soft, nontender, PEG tube in place  Extremities: 2+ upper and lower extremity edema  Neurologic: Lethargic but arousable  GU Rectal tube  Access: Left IJ PC      Basic Metabolic Panel: Recent Labs  Lab 03/09/19 0528 03/11/19 0502 03/14/19 0502 03/15/19 0509  NA 134* 137 138 138  K 3.8 3.4* 2.8* 3.8  CL 101 102 104 101  CO2 23 29 30 26   GLUCOSE 141* 151* 171* 203*  BUN 39* 33* 21 34*  CREATININE 3.29* 2.97* 1.90* 2.70*  CALCIUM 8.4* 8.4* 8.1* 8.1*  MG 2.2 2.1  --   --   PHOS  --   --  2.0* 4.0    Liver Function Tests: Recent Labs  Lab 03/09/19 0528 03/11/19 0502 03/14/19 0502 03/15/19 0509  AST 18 17  --   --   ALT 7 7  --   --   ALKPHOS 68 74  --   --   BILITOT 0.9 0.7  --   --   PROT 4.9* 5.0*  --   --   ALBUMIN 1.9* 1.8* 1.8* 1.8*   No results for input(s): LIPASE, AMYLASE in the last 168 hours. No results for input(s): AMMONIA in the last 168 hours.  CBC: Recent Labs  Lab 03/11/19 0502 03/12/19 0536 03/13/19 0618 03/14/19 0502 03/15/19 0518  WBC 9.8 10.0 8.9 9.1 10.4  HGB 7.2* 7.8* 8.5* 7.4* 7.5*  HCT 22.6* 24.7* 27.4* 24.2* 24.1*   MCV 97.0 98.0 98.9 100.0 100.0  PLT 245 279 262 235 263    Cardiac Enzymes: No results for input(s): CKTOTAL, CKMB, CKMBINDEX, TROPONINI in the last 168 hours.  BNP: Invalid input(s): POCBNP  CBG: Recent Labs  Lab 03/14/19 1636 03/14/19 2017 03/15/19 0003 03/15/19 0413 03/15/19 0737  GLUCAP 220* 198* 178* 184* 165*    Microbiology: Results for orders placed or performed during the hospital encounter of 02/09/19  Culture, blood (routine x 2)     Status: Abnormal   Collection Time: 02/09/19  2:29 PM   Specimen: BLOOD LEFT ARM  Result Value Ref Range Status   Specimen Description   Final    BLOOD LEFT ARM Performed at [facility], [address], [place], [place] [address]    Special Requests   Final    BOTTLES DRAWN AEROBIC AND ANAEROBIC Blood Culture adequate volume Performed at [facility], [address] [place], [place] [address]    Culture  Setup Time   Final    GRAM POSITIVE COCCI IN BOTH AEROBIC AND ANAEROBIC BOTTLES CRITICAL VALUE NOTED.  VALUE IS CONSISTENT WITH PREVIOUSLY REPORTED  AND CALLED VALUE. Performed at [facility], [address] [place], [place] [address]    Culture (A)  Final    STAPHYLOCOCCUS AUREUS SUSCEPTIBILITIES PERFORMED ON PREVIOUS CULTURE WITHIN THE LAST 5 DAYS. Performed at [facility], [place] [address], [place], [place] [address]    Report Status 02/12/2019 FINAL  Final  Culture, blood (routine x 2)     Status: Abnormal   Collection Time: 02/09/19  2:38 PM   Specimen: BLOOD LEFT ARM  Result Value Ref Range Status   Specimen Description   Final    BLOOD LEFT ARM Performed at [facility], [address] [place], [place] [address]    Special Requests   Final    BOTTLES DRAWN AEROBIC AND ANAEROBIC Blood Culture adequate volume Performed at [facility], [address], [place], [place] [address]    Culture  Setup Time   Final    GRAM POSITIVE COCCI IN BOTH AEROBIC AND  ANAEROBIC BOTTLES CRITICAL RESULT CALLED TO, READ BACK BY AND VERIFIED WITH: Troy [id number] 02/10/2019 HNM Performed at [facility], [place] [address] [place],  [address]    Culture METHICILLIN RESISTANT STAPHYLOCOCCUS AUREUS (A)  Final   Report Status 02/12/2019 FINAL  Final   Organism ID, Bacteria METHICILLIN RESISTANT STAPHYLOCOCCUS AUREUS  Final      Susceptibility   Methicillin resistant staphylococcus aureus - MIC*    CIPROFLOXACIN >=8 RESISTANT Resistant     ERYTHROMYCIN >=8 RESISTANT Resistant     GENTAMICIN <=0.5 SENSITIVE Sensitive     OXACILLIN >=4 RESISTANT Resistant     TETRACYCLINE <=1 SENSITIVE Sensitive     VANCOMYCIN 1 SENSITIVE Sensitive     TRIMETH/SULFA <=10 SENSITIVE Sensitive     CLINDAMYCIN RESISTANT Resistant     RIFAMPIN <=0.5 SENSITIVE Sensitive     Inducible Clindamycin POSITIVE Resistant     * METHICILLIN RESISTANT STAPHYLOCOCCUS AUREUS  Blood Culture ID Panel (Reflexed)     Status: Abnormal   Collection Time: 02/09/19  2:38 PM  Result Value Ref Range Status   Enterococcus species NOT DETECTED NOT DETECTED Final   Listeria monocytogenes NOT DETECTED NOT DETECTED Final   Staphylococcus species DETECTED (A) NOT DETECTED Final    Comment: CRITICAL RESULT CALLED TO, READ BACK BY AND VERIFIED WITH: Hart Robinsons PHARMD [id number] 02/10/2019 HNM    Staphylococcus aureus (BCID) DETECTED (A) NOT DETECTED Final    Comment: Methicillin (oxacillin)-resistant Staphylococcus aureus (MRSA). MRSA is predictably resistant to beta-lactam antibiotics (except ceftaroline). Preferred therapy is vancomycin unless clinically contraindicated. Patient requires contact precautions if  hospitalized. CRITICAL RESULT CALLED TO, READ BACK BY AND VERIFIED WITH: Hart Robinsons PHARMD [id number] 02/10/2019 HNM    Methicillin resistance DETECTED (A) NOT DETECTED Final    Comment: CRITICAL RESULT CALLED TO, READ BACK BY AND VERIFIED WITH: Hart Robinsons PHARMD [id number] 02/10/2019 HNM     Streptococcus species NOT DETECTED NOT DETECTED Final   Streptococcus agalactiae NOT DETECTED NOT DETECTED Final   Streptococcus pneumoniae NOT DETECTED NOT DETECTED Final   Streptococcus pyogenes NOT DETECTED NOT DETECTED Final   Acinetobacter baumannii NOT DETECTED NOT DETECTED Final   Enterobacteriaceae species NOT DETECTED NOT DETECTED Final   Enterobacter cloacae complex NOT DETECTED NOT DETECTED Final   Escherichia coli NOT DETECTED NOT DETECTED Final   Klebsiella oxytoca NOT DETECTED NOT DETECTED Final   Klebsiella pneumoniae NOT DETECTED NOT DETECTED Final   Proteus species NOT DETECTED NOT DETECTED Final   Serratia marcescens NOT  DETECTED NOT DETECTED Final   Haemophilus influenzae NOT DETECTED NOT DETECTED Final   Neisseria meningitidis NOT DETECTED NOT DETECTED Final   Pseudomonas aeruginosa NOT DETECTED NOT DETECTED Final   Candida albicans NOT DETECTED NOT DETECTED Final   Candida glabrata NOT DETECTED NOT DETECTED Final   Candida krusei NOT DETECTED NOT DETECTED Final   Candida parapsilosis NOT DETECTED NOT DETECTED Final   Candida tropicalis NOT DETECTED NOT DETECTED Final    Comment: Performed at [facility], [place], [place] [address]  SARS CORONAVIRUS 2 (TAT 6-24 HRS) Nasopharyngeal Nasopharyngeal Swab     Status: None   Collection Time: 02/09/19  2:54 PM   Specimen: Nasopharyngeal Swab  Result Value Ref Range Status   SARS Coronavirus 2 NEGATIVE NEGATIVE Final    Comment: (NOTE) SARS-CoV-2 target nucleic acids are NOT DETECTED. The SARS-CoV-2 RNA is generally detectable in upper and lower respiratory specimens during the acute phase of infection. Negative results do not preclude SARS-CoV-2 infection, do not rule out co-infections with other pathogens, and should not be used as the sole basis for treatment or other patient management decisions. Negative results must be combined with clinical observations, patient history, and  epidemiological information. The expected result is Negative. Fact Sheet for Patients: SugarRoll.be Fact Sheet for Healthcare Providers: https://www.woods-mathews.com/ This test is not yet approved or cleared by the [place] FDA and  has been authorized for detection and/or diagnosis of SARS-CoV-2 by FDA under an Emergency Use Authorization (EUA). This EUA will remain  in effect (meaning this test can be used) for the duration of the COVID-19 declaration under Section 56 4(b)(1) of the Act, 21 U.S.C. section 360bbb-3(b)(1), unless the authorization is terminated or revoked sooner. Performed at [facility], [place] [address], [place], [place] [address]   MRSA PCR Screening     Status: Abnormal   Collection Time: 02/10/19  5:03 PM   Specimen: Nasopharyngeal  Result Value Ref Range Status   MRSA by PCR POSITIVE (A) NEGATIVE Final    Comment:        The GeneXpert MRSA Assay (FDA approved for NASAL specimens only), is one component of a comprehensive MRSA colonization surveillance program. It is not intended to diagnose MRSA infection nor to guide or monitor treatment for MRSA infections. RESULT CALLED TO, READ BACK BY AND VERIFIED WITH: Britt Boozer @1839  02/10/19 MJU Performed at [facility], [address] [place], [place] [address]   Culture, blood (routine x 2)     Status: Abnormal   Collection Time: 02/11/19 12:31 PM   Specimen: BLOOD  Result Value Ref Range Status   Specimen Description   Final    BLOOD PORTA CATH Performed at [place] [address] [place], [place] [address]    Special Requests   Final    BOTTLES DRAWN AEROBIC AND ANAEROBIC Blood Culture adequate volume Performed at [facility], [address] [place], [place] [address]    Culture  Setup Time   Final    GRAM POSITIVE COCCI ANAEROBIC BOTTLE ONLY CRITICAL RESULT CALLED TO, READ BACK BY AND VERIFIED WITH: SCOTT  HALL AT [id number] 02/12/2019 [facility]  Performed at [facility], [address] [place], [place] [address]    Culture (A)  Final    STAPHYLOCOCCUS AUREUS SUSCEPTIBILITIES PERFORMED ON PREVIOUS CULTURE WITHIN THE LAST 5 DAYS. Performed at [facility], [place] [address], [place], [place] [address]    Report Status 02/13/2019  FINAL  Final  Culture, blood (routine x 2)     Status: None   Collection Time: 02/11/19  3:57 PM   Specimen: BLOOD  Result Value Ref Range Status   Specimen Description BLOOD PORTA CATH  Final   Special Requests   Final    BOTTLES DRAWN AEROBIC AND ANAEROBIC Blood Culture adequate volume   Culture   Final    NO GROWTH 5 DAYS Performed at [facility], [address] [place], [place] [address]    Report Status 02/16/2019 FINAL  Final  CULTURE, BLOOD (ROUTINE X 2) w Reflex to ID Panel     Status: Abnormal   Collection Time: 02/13/19  2:18 PM   Specimen: BLOOD LEFT HAND  Result Value Ref Range Status   Specimen Description BLOOD LEFT HAND  Final   Special Requests   Final    BOTTLES DRAWN AEROBIC AND ANAEROBIC Blood Culture results may not be optimal due to an inadequate volume of blood received in culture bottles   Culture  Setup Time   Final    AEROBIC BOTTLE ONLY GRAM POSITIVE COCCI IN CLUSTERS CRITICAL RESULT CALLED TO, READ BACK BY AND VERIFIED WITH: Herbert Pun AT [id number] ON 02/14/2019 [place]. Performed at [facility], [place] [address], [place], [place] [address]    Culture METHICILLIN RESISTANT STAPHYLOCOCCUS AUREUS (A)  Final   Report Status 02/16/2019 FINAL  Final   Organism ID, Bacteria METHICILLIN RESISTANT STAPHYLOCOCCUS AUREUS  Final      Susceptibility   Methicillin resistant staphylococcus aureus - MIC*    CIPROFLOXACIN >=8 RESISTANT Resistant     ERYTHROMYCIN >=8 RESISTANT Resistant     GENTAMICIN <=0.5 SENSITIVE Sensitive     OXACILLIN >=4 RESISTANT Resistant     TETRACYCLINE <=1 SENSITIVE Sensitive     VANCOMYCIN <=0.5  SENSITIVE Sensitive     TRIMETH/SULFA <=10 SENSITIVE Sensitive     CLINDAMYCIN RESISTANT Resistant     RIFAMPIN <=0.5 SENSITIVE Sensitive     Inducible Clindamycin POSITIVE Resistant     * METHICILLIN RESISTANT STAPHYLOCOCCUS AUREUS  Blood Culture ID Panel (Reflexed)     Status: Abnormal   Collection Time: 02/13/19  2:18 PM  Result Value Ref Range Status   Enterococcus species NOT DETECTED NOT DETECTED Final   Listeria monocytogenes NOT DETECTED NOT DETECTED Final   Staphylococcus species DETECTED (A) NOT DETECTED Final    Comment: CRITICAL RESULT CALLED TO, READ BACK BY AND VERIFIED WITH: ABBEY ELLINGTON AT [id number] ON 02/14/2019 [place]    Staphylococcus aureus (BCID) DETECTED (A) NOT DETECTED Final    Comment: Methicillin (oxacillin)-resistant Staphylococcus aureus (MRSA). MRSA is predictably resistant to beta-lactam antibiotics (except ceftaroline). Preferred therapy is vancomycin unless clinically contraindicated. Patient requires contact precautions if  hospitalized. CRITICAL RESULT CALLED TO, READ BACK BY AND VERIFIED WITH: Herbert Pun AT [id number] ON 02/14/2019 [place].    Methicillin resistance DETECTED (A) NOT DETECTED Final    Comment: CRITICAL RESULT CALLED TO, READ BACK BY AND VERIFIED WITH: Herbert Pun AT [id number] ON 02/14/2019 [place].    Streptococcus species NOT DETECTED NOT DETECTED Final   Streptococcus agalactiae NOT DETECTED NOT DETECTED Final   Streptococcus pneumoniae NOT DETECTED NOT DETECTED Final   Streptococcus pyogenes NOT DETECTED NOT DETECTED Final   Acinetobacter baumannii NOT DETECTED NOT DETECTED Final   Enterobacteriaceae species NOT DETECTED NOT DETECTED Final   Enterobacter cloacae complex NOT DETECTED NOT DETECTED Final   Escherichia coli NOT DETECTED NOT DETECTED Final   Klebsiella oxytoca NOT  DETECTED NOT DETECTED Final   Klebsiella pneumoniae NOT DETECTED NOT DETECTED Final   Proteus species NOT DETECTED NOT DETECTED Final   Serratia marcescens NOT DETECTED  NOT DETECTED Final   Haemophilus influenzae NOT DETECTED NOT DETECTED Final   Neisseria meningitidis NOT DETECTED NOT DETECTED Final   Pseudomonas aeruginosa NOT DETECTED NOT DETECTED Final   Candida albicans NOT DETECTED NOT DETECTED Final   Candida glabrata NOT DETECTED NOT DETECTED Final   Candida krusei NOT DETECTED NOT DETECTED Final   Candida parapsilosis NOT DETECTED NOT DETECTED Final   Candida tropicalis NOT DETECTED NOT DETECTED Final    Comment: Performed at [facility], [address] [place], [place] [address]  CULTURE, BLOOD (ROUTINE X 2) w Reflex to ID Panel     Status: None   Collection Time: 02/13/19  3:09 PM   Specimen: BLOOD  Result Value Ref Range Status   Specimen Description BLOOD BLOOD LEFT HAND  Final   Special Requests   Final    BOTTLES DRAWN AEROBIC AND ANAEROBIC Blood Culture adequate volume   Culture   Final    NO GROWTH 5 DAYS Performed at [facility], [address], [place], [place] [address]    Report Status 02/18/2019 FINAL  Final  Culture, blood (routine x 2)     Status: Abnormal   Collection Time: 02/15/19 12:49 PM   Specimen: BLOOD  Result Value Ref Range Status   Specimen Description   Final    BLOOD LT HAND Performed at [facility], [address] [place], [place] [address]    Special Requests   Final    BOTTLES DRAWN AEROBIC AND ANAEROBIC Blood Culture results may not be optimal due to an inadequate volume of blood received in culture bottles Performed at [facility], [address] [place], [place] [address]    Culture  Setup Time   Final    GRAM POSITIVE COCCI IN BOTH AEROBIC AND ANAEROBIC BOTTLES CRITICAL RESULT CALLED TO, READ BACK BY AND VERIFIED WITHEleonore Chiquito AT [id number] 02/16/2019 SDR Performed at [facility], [address] [place], [place] [address]    Culture (A)  Final    METHICILLIN RESISTANT STAPHYLOCOCCUS AUREUS STAPHYLOCOCCUS SPECIES (COAGULASE NEGATIVE) THE  SIGNIFICANCE OF ISOLATING THIS ORGANISM FROM A SINGLE SET OF BLOOD CULTURES WHEN MULTIPLE SETS ARE DRAWN IS UNCERTAIN. PLEASE NOTIFY THE MICROBIOLOGY DEPARTMENT WITHIN ONE WEEK IF SPECIATION AND SENSITIVITIES ARE REQUIRED. Performed at [facility], [place] [address] [place],  [address]    Report Status 02/18/2019 FINAL  Final   Organism ID, Bacteria METHICILLIN RESISTANT STAPHYLOCOCCUS AUREUS  Final      Susceptibility   Methicillin resistant staphylococcus aureus - MIC*    CIPROFLOXACIN >=8 RESISTANT Resistant     ERYTHROMYCIN >=8 RESISTANT Resistant     GENTAMICIN <=0.5 SENSITIVE Sensitive     OXACILLIN >=4 RESISTANT Resistant     TETRACYCLINE <=1 SENSITIVE Sensitive     VANCOMYCIN <=0.5 SENSITIVE Sensitive     TRIMETH/SULFA <=10 SENSITIVE Sensitive     CLINDAMYCIN RESISTANT Resistant     RIFAMPIN <=0.5 SENSITIVE Sensitive     Inducible Clindamycin POSITIVE Resistant     * METHICILLIN RESISTANT STAPHYLOCOCCUS AUREUS  Aerobic Culture (superficial specimen)     Status: None   Collection Time: 02/17/19  1:31 PM   Specimen: Heel; Wound  Result Value Ref Range Status   Specimen Description   Final    HEEL Performed at [facility], [address], [place], [place] [address]    Special Requests   Final    NONE Performed at [facility], [address] [place], [place] [address]    Gram Stain   Final    NO WBC SEEN MODERATE GRAM POSITIVE COCCI IN PAIRS    Culture   Final    ABUNDANT METHICILLIN RESISTANT STAPHYLOCOCCUS AUREUS ABUNDANT DIPHTHEROIDS(CORYNEBACTERIUM SPECIES) Standardized susceptibility testing for this organism is not available. Performed at [facility], [place] [address], [place], [place] [address]    Report Status 02/19/2019 FINAL  Final   Organism ID, Bacteria METHICILLIN RESISTANT STAPHYLOCOCCUS AUREUS  Final      Susceptibility   Methicillin resistant staphylococcus aureus - MIC*    CIPROFLOXACIN >=8 RESISTANT Resistant      ERYTHROMYCIN >=8 RESISTANT Resistant     GENTAMICIN <=0.5 SENSITIVE Sensitive     OXACILLIN >=4 RESISTANT Resistant     TETRACYCLINE <=1 SENSITIVE Sensitive     VANCOMYCIN 1 SENSITIVE Sensitive     TRIMETH/SULFA <=10 SENSITIVE Sensitive     CLINDAMYCIN RESISTANT Resistant     RIFAMPIN <=0.5 SENSITIVE Sensitive     Inducible Clindamycin POSITIVE Resistant     * ABUNDANT METHICILLIN RESISTANT STAPHYLOCOCCUS AUREUS  CULTURE, BLOOD (ROUTINE X 2) w Reflex to ID Panel     Status: None   Collection Time: 02/18/19 11:29 PM   Specimen: BLOOD  Result Value Ref Range Status   Specimen Description BLOOD LEFT ARM  Final   Special Requests   Final    BOTTLES DRAWN AEROBIC AND ANAEROBIC Blood Culture adequate volume   Culture   Final    NO GROWTH 5 DAYS Performed at [facility], [address] [place], [place] [address]    Report Status 02/23/2019 FINAL  Final  CULTURE, BLOOD (ROUTINE X 2) w Reflex to ID Panel     Status: None   Collection Time: 02/18/19 11:29 PM   Specimen: BLOOD  Result Value Ref Range Status   Specimen Description BLOOD LEFT ARM  Final   Special Requests   Final    BOTTLES DRAWN AEROBIC AND ANAEROBIC Blood Culture adequate volume   Culture   Final    NO GROWTH 5 DAYS Performed at [facility], [address] [place], [place] [address]    Report Status 02/23/2019 FINAL  Final  Aerobic/Anaerobic Culture (surgical/deep wound)     Status: None   Collection Time: 03/04/19 11:04 AM   Specimen: PATH Other; Tissue  Result Value Ref Range Status   Specimen Description   Final    ARTERY Performed at [facility], [address] [place], [place] [address]    Special Requests   Final    RIGHT AV GRAFT AND STENTS CULTURE Performed at [facility], [address] [place], [place] [address]    Gram Stain   Final    RARE WBC PRESENT, PREDOMINANTLY PMN NO ORGANISMS SEEN    Culture   Final    No growth aerobically or  anaerobically. Performed at [facility], [place] [address], [place], [place] [address]    Report Status 03/09/2019 FINAL  Final  Aerobic/Anaerobic Culture (surgical/deep wound)     Status: None   Collection Time: 03/04/19 11:29 AM   Specimen: PATH Other; Tissue  Result Value Ref Range Status   Specimen Description   Final    ARTERY Performed at [facility], [address], [place], [place] [address]  Special Requests   Final    FLUID AROUND AV GRAFT Performed at [facility], [place], [place] [address]    Gram Stain   Final    FEW WBC PRESENT, PREDOMINANTLY PMN NO ORGANISMS SEEN    Culture   Final    No growth aerobically or anaerobically. Performed at [facility], [place] [address], [place], [place] [address]    Report Status 03/09/2019 FINAL  Final    Coagulation Studies: Recent Labs    03/13/19 0618  LABPROT 16.7*  INR 1.4*    Urinalysis: No results for input(s): COLORURINE, LABSPEC, PHURINE, GLUCOSEU, HGBUR, BILIRUBINUR, KETONESUR, PROTEINUR, UROBILINOGEN, NITRITE, LEUKOCYTESUR in the last 72 hours.  Invalid input(s): APPERANCEUR    Imaging: No results found.   Medications:   . sodium chloride 15 mL (03/15/19 1034)  . feeding supplement (OSMOLITE 1.5 CAL) 1,000 mL (03/14/19 1555)  . thiamine injection 500 mg (03/15/19 1037)  . vancomycin 750 mg (03/13/19 1958)   . apixaban  5 mg Oral BID  . aspirin  81 mg Oral Daily  . B-complex with vitamin C  1 tablet Per Tube Daily  . Chlorhexidine Gluconate Cloth  6 each Topical Q0600  . epoetin (EPOGEN/PROCRIT) injection  4,000 Units Intravenous Q M,W,F-HD  . feeding supplement (PRO-STAT SUGAR FREE 64)  30 mL Per Tube Daily  . fluticasone  2 spray Each Nare Daily  . free water  20 mL Per Tube Q4H  . insulin aspart  0-15 Units Subcutaneous Q4H  . [START ON 03/16/2019] levothyroxine  100 mcg Oral QAC breakfast  . mouth rinse  15 mL Mouth Rinse BID  . pantoprazole  (PROTONIX) IV  40 mg Intravenous Q12H  . psyllium  1 packet Per Tube BID  . venlafaxine  75 mg Oral BID WC  . zinc sulfate  220 mg Oral Daily   sodium chloride, ipratropium-albuterol, ondansetron (ZOFRAN) IV  Assessment/ Plan:  [name] is a 64 y.o. white female with end-stage renal disease on hemodialysis, atrial fibrillation, chronic diastolic congestive heart failure, coronary artery disease status post CABG, diabetes mellitus type II, hypertension, hyperlipidemia, hypothyroidism, Covid positive in July 2020, recent nursing home stay in [place]  Prolonged hospitalization for MRSA bacteremia/sepsis and now with acute encephalopathy  [facility] MWF [facility] [address] Left AVG 81.5kg  1. End Stage Renal Disease: on hemodialysis. With complication of dialysis device. Right arm AVG removed 12/30 due to suspected source of bacteremia Now with left IJ permcath - Continue MWF schedule - IV albumin with HD treatment.   2. Hypotension -Continue midodrine 10 mg 3 times daily.  3. Anemia of chronic kidney disease: hemoglobin 7.5 Status post PRBC transfusion on 12/22 EPO with HD treatments.   4. Secondary Hyperparathyroidism: with hypophosphatemia - IV replacement of phosphorus.   5. Acute Encephalopathy: No significant change in mental status.  Remains arousable but confused. - Appreciate Neurology input. Showed some improvement between both EEGs - Thiamine   LOS: 48 Jamie Arias 1/10/202111:01 AM

## 2019-03-15 NOTE — Progress Notes (Signed)
PROGRESS NOTE    Jamie Arias  [id number] DOB: 08-May-1955 DOA: 02/09/2019 PCP: [name], MD   Brief Narrative:  Jamie Arias is a 64 y.o. white female with end-stage renal disease on hemodialysis, atrial fibrillation, chronic diastolic congestive heart failure, coronary artery disease status post CABG, diabetes mellitus type II, hypertension, hyperlipidemia, hypothyroidism, Covid positive in July 2020, recent nursing home stay in [place] Prolonged hospitalization for MRSA bacteremia/sepsis and now with acute encephalopathy.  Subjective: Patient remained encephalopathic.  Husband was at bedside.  Assessment & Plan:   Principal Problem:   Sepsis due to methicillin resistant Staphylococcus aureus (MRSA) ([place]) Active Problems:   Type 2 diabetes mellitus with ESRD (end-stage renal disease) (HCC)   Atrial fibrillation, chronic (HCC)   Hypotension   Chronic diastolic CHF (congestive heart failure) (HCC)   ESRD (end stage renal disease) (HCC)   Altered level of consciousness   Decubitus ulcer of heel, bilateral, stage 2 (HCC)   MRSA bacteremia   Arm DVT (deep venous thromboembolism), acute, left (HCC)   Acute encephalopathy   Acute metabolic encephalopathy   Acute blood loss anemia   AF (paroxysmal atrial fibrillation) ([place])   Diarrhea   Oropharyngeal dysphagia   Abscess  Sepsis secondary to MRSA bacteremia.  No obvious source.  Right arm graft was removed on 03/04/2019. -Continue with vancomycin for 6-week, will finish Jamie Arias course on 04/01/2019.  Acute metabolic encephalopathy.  Mental status still impaired.  MRI of brain and EEG negative. Patient remained afebrile with no leukocytosis. Repeat TSH was elevated.  B12 within normal range, folate and B1 results are pending.  She was started on thiamine challenge yesterday by neurology. We consulted neurology again yesterday because of persistent encephalopathy. They do not think that she needs an LP, may be mental  status is lagging behind. -Increased dose of Synthroid to 100 -Continue with thiamine challenge-will determine future need depending on B1 results. -Overall prognosis seems poor.  Bleeding from catheter site with multiple ecchymosis.  No more active bleeding .  Warfarin and heparin was discontinued yesterday of concern for necrosis of skin.  Hemoglobin stable around 7.5. -She was started on Eliquis .  Vaginal discharge.  There was some nursing concern of vaginal discharge today. -Patient needs wet mount-apparently has to consult GYN. -GYN was consulted.  Anemia.  Hemoglobin stable at 7.5 today.  Iron studies consistent with anemia of chronic illness.  MCV elevated. -We will check B12 and RBC folate. B12 within normal range, RBC folate pending. -Continue EPO with dialysis.  Hypotension.  Blood pressure elevated now. -Midodrine was discontinued yesterday. -She might need antihypertensive, we will monitor at this time.  ESRD (M [facility]F).   Currently using tunneled catheter as graft was removed due to MRSA bacteremia. -Continue with scheduled dialysis.  Left arm DVT.  Patient was on Heparin.  Recent diagnosis of left arm DVT on 03/03/2019. -Discontinue heparin and Coumadin due to concern for Coumadin toxicity/side effect due to Jamie Arias skin necrotic lesions. -Start Jamie Arias on renally dosed Eliquis from tomorrow.  Hypothyroidism.  Repeat TSH elevated at 17.82. -Increase the dose of Synthroid to 100. -We will repeat TSH in 2 to 3 weeks.  Paroxysmal A. Fib.. Currently in sinus. Amiodarone was discontinued. -Continue anticoagulation with Eliquis.  Type 2 diabetes. -Continue SSI.  Chronic diastolic heart failure.  Volume being managed by dialysis.  Bilateral decubitus ulcers.  Present on admission. -Continue with wound care.  Nutrition.  PEG placed 03/03/2019.  Continue tube feeding.  Acute hypoxic respiratory failure.  Resolved.  Patient was intubated initially, extubated on  03/05/2019.  Objective: Vitals:   03/14/19 0411 03/14/19 1140 03/14/19 2036 03/15/19 0416  BP: (!) 157/66 135/62 (!) 168/65 (!) 151/62  Pulse: 86 83 84 80  Resp: 16 18 16 20   Temp: 98.6 F (37 C) 98.7 F (37.1 C) 99.4 F (37.4 C) 99.1 F (37.3 C)  TempSrc: Oral Oral Oral Oral  SpO2: 99% 100% 99% 99%  Weight:      Height:        Intake/Output Summary (Last 24 hours) at 03/15/2019 1410 Last data filed at 03/15/2019 0604 Gross per 24 hour  Intake 1652.83 ml  Output 400 ml  Net 1252.83 ml   Filed Weights   03/02/19 0930 03/06/19 1500 03/10/19 1220  Weight: 75.5 kg 83 kg 87.3 kg    Examination:  General exam: Chronically ill-appearing lady, minimal response to painful stimuli, in no acute distress. Respiratory system: Clear to auscultation. Respiratory effort normal. Cardiovascular system: S1 & S2 heard, RRR. No JVD, murmurs, rubs, gallops or clicks. Gastrointestinal system: Soft, nontender, nondistended, bowel sounds positive. Central nervous system: Unable to assess as patient is minimally responsive and not following any commands. Extremities: Bilateral upper extremity edema, no cyanosis, pulses intact and symmetrical. Skin: Decubitus ulcers on both heels.  Multiple ecchymosis on all extremities and upper chest, appears healing now. Psychiatry: Judgement and insight appear impaired.  DVT prophylaxis: Eliquis Code Status: DNR Family Communication: Husband was updated at bedside. Disposition Plan: Waiting for bed offer at [facility].  Consultants:   Nephrology  Infectious disease  Vascular surgery  Neurology  Critical care specialist   Procedures:  Antimicrobials:  Vancomycin.  Data Reviewed: I have personally reviewed following labs and imaging studies  CBC: Recent Labs  Lab 03/11/19 0502 03/12/19 0536 03/13/19 0618 03/14/19 0502 03/15/19 0518  WBC 9.8 10.0 8.9 9.1 10.4  HGB 7.2* 7.8* 8.5* 7.4* 7.5*  HCT 22.6* 24.7* 27.4* 24.2* 24.1*  MCV 97.0  98.0 98.9 100.0 100.0  PLT 245 279 262 235 [id number]   Basic Metabolic Panel: Recent Labs  Lab 03/09/19 0528 03/11/19 0502 03/14/19 0502 03/15/19 0509  NA 134* 137 138 138  K 3.8 3.4* 2.8* 3.8  CL 101 102 104 101  CO2 23 29 30 26   GLUCOSE 141* 151* 171* 203*  BUN 39* 33* 21 34*  CREATININE 3.29* 2.97* 1.90* 2.70*  CALCIUM 8.4* 8.4* 8.1* 8.1*  MG 2.2 2.1  --   --   PHOS  --   --  2.0* 4.0   GFR: Estimated Creatinine Clearance: 22.2 mL/min (A) (by C-G formula based on SCr of 2.7 mg/dL (H)). Liver Function Tests: Recent Labs  Lab 03/09/19 0528 03/11/19 0502 03/14/19 0502 03/15/19 0509  AST 18 17  --   --   ALT 7 7  --   --   ALKPHOS 68 74  --   --   BILITOT 0.9 0.7  --   --   PROT 4.9* 5.0*  --   --   ALBUMIN 1.9* 1.8* 1.8* 1.8*   No results for input(s): LIPASE, AMYLASE in the last 168 hours. No results for input(s): AMMONIA in the last 168 hours. Coagulation Profile: Recent Labs  Lab 03/09/19 0528 03/11/19 0502 03/12/19 0536 03/13/19 0618  INR 1.1 1.2 1.2 1.4*   Cardiac Enzymes: No results for input(s): CKTOTAL, CKMB, CKMBINDEX, TROPONINI in the last 168 hours. BNP (last 3 results) No results for input(s): PROBNP in the  last 8760 hours. HbA1C: No results for input(s): HGBA1C in the last 72 hours. CBG: Recent Labs  Lab 03/14/19 2017 03/15/19 0003 03/15/19 0413 03/15/19 0737 03/15/19 1118  GLUCAP 198* 178* 184* 165* 134*   Lipid Profile: No results for input(s): CHOL, HDL, LDLCALC, TRIG, CHOLHDL, LDLDIRECT in the last 72 hours. Thyroid Function Tests: Recent Labs    03/15/19 0518  TSH 17.828*   Anemia Panel: Recent Labs    03/14/19 0502 03/15/19 0518  VITAMINB12  --  437  FERRITIN 1,664*  --   TIBC NOT CALCULATED  --   IRON 54  --    Sepsis Labs: No results for input(s): PROCALCITON, LATICACIDVEN in the last 168 hours.  No results found for this or any previous visit (from the past 240 hour(s)).   Radiology Studies: No results  found.  Scheduled Meds: . apixaban  5 mg Oral BID  . aspirin  81 mg Oral Daily  . B-complex with vitamin C  1 tablet Per Tube Daily  . Chlorhexidine Gluconate Cloth  6 each Topical Q0600  . epoetin (EPOGEN/PROCRIT) injection  4,000 Units Intravenous Q M,W,F-HD  . feeding supplement (PRO-STAT SUGAR FREE 64)  30 mL Per Tube Daily  . fluticasone  2 spray Each Nare Daily  . free water  20 mL Per Tube Q4H  . insulin aspart  0-15 Units Subcutaneous Q4H  . [START ON 03/16/2019] levothyroxine  100 mcg Oral QAC breakfast  . mouth rinse  15 mL Mouth Rinse BID  . pantoprazole (PROTONIX) IV  40 mg Intravenous Q12H  . psyllium  1 packet Per Tube BID  . venlafaxine  75 mg Oral BID WC  . zinc sulfate  220 mg Oral Daily   Continuous Infusions: . sodium chloride 15 mL (03/15/19 1034)  . feeding supplement (OSMOLITE 1.5 CAL) 1,000 mL (03/15/19 1148)  . thiamine injection 500 mg (03/15/19 1037)  . vancomycin 750 mg (03/13/19 1958)     LOS: 34 days   Time spent: 35 min.   Lorella Nimrod, MD [facility] Pager [phone]  If 7PM-7AM, please contact night-coverage [email or web address] Password [facility] 03/15/2019, 2:10 PM   This record has been created using Systems analyst. Errors have been sought and corrected,but may not always be located. Such creation errors do not reflect on the standard of care.

## 2019-03-16 LAB — RENAL FUNCTION PANEL
Albumin: 1.7 g/dL — ABNORMAL LOW (ref 3.5–5.0)
Anion gap: 10 (ref 5–15)
BUN: 50 mg/dL — ABNORMAL HIGH (ref 8–23)
CO2: 25 mmol/L (ref 22–32)
Calcium: 7.9 mg/dL — ABNORMAL LOW (ref 8.9–10.3)
Chloride: 101 mmol/L (ref 98–111)
Creatinine, Ser: 3.44 mg/dL — ABNORMAL HIGH (ref 0.44–1.00)
GFR calc Af Amer: 15 mL/min — ABNORMAL LOW (ref >60)
GFR calc non Af Amer: 13 mL/min — ABNORMAL LOW (ref >60)
Glucose, Bld: 175 mg/dL — ABNORMAL HIGH (ref 70–99)
Phosphorus: 4.9 mg/dL — ABNORMAL HIGH (ref 2.5–4.6)
Potassium: 4.6 mmol/L (ref 3.5–5.1)
Sodium: 136 mmol/L (ref 135–145)

## 2019-03-16 LAB — THYROID PANEL
Free Thyroxine Index: 1.4 (ref 1.2–4.9)
T3 Uptake Ratio: 33 % (ref 24–39)
T4, Total: 4.1 ug/dL — ABNORMAL LOW (ref 4.5–12.0)

## 2019-03-16 LAB — WET PREP, GENITAL
Clue Cells Wet Prep HPF POC: NONE SEEN
Sperm: NONE SEEN
Trich, Wet Prep: NONE SEEN

## 2019-03-16 LAB — CBC
HCT: 23.4 % — ABNORMAL LOW (ref 36.0–46.0)
Hemoglobin: 7.2 g/dL — ABNORMAL LOW (ref 12.0–15.0)
MCH: 31.2 pg (ref 26.0–34.0)
MCHC: 30.8 g/dL (ref 30.0–36.0)
MCV: 101.3 fL — ABNORMAL HIGH (ref 80.0–100.0)
Platelets: 218 10*3/uL (ref 150–400)
RBC: 2.31 MIL/uL — ABNORMAL LOW (ref 3.87–5.11)
RDW: 21.9 % — ABNORMAL HIGH (ref 11.5–15.5)
WBC: 7.9 10*3/uL (ref 4.0–10.5)
nRBC: 0 % (ref 0.0–0.2)

## 2019-03-16 LAB — GLUCOSE, CAPILLARY
Glucose-Capillary: 122 mg/dL — ABNORMAL HIGH (ref 70–99)
Glucose-Capillary: 147 mg/dL — ABNORMAL HIGH (ref 70–99)
Glucose-Capillary: 160 mg/dL — ABNORMAL HIGH (ref 70–99)
Glucose-Capillary: 171 mg/dL — ABNORMAL HIGH (ref 70–99)
Glucose-Capillary: 89 mg/dL (ref 70–99)

## 2019-03-16 MED ORDER — ZINC SULFATE 220 (50 ZN) MG PO CAPS
220.0000 mg | ORAL_CAPSULE | Freq: Every day | ORAL | Status: DC
  Administered 2019-03-16 – 2019-03-29 (×14): 220 mg
  Filled 2019-03-16 (×16): qty 1

## 2019-03-16 MED ORDER — PSYLLIUM 95 % PO PACK
1.0000 | PACK | Freq: Three times a day (TID) | ORAL | Status: DC
  Administered 2019-03-16 – 2019-04-04 (×51): 1
  Filled 2019-03-16 (×63): qty 1

## 2019-03-16 MED ORDER — LEVOTHYROXINE SODIUM 100 MCG PO TABS
100.0000 ug | ORAL_TABLET | Freq: Every day | ORAL | Status: DC
  Administered 2019-03-17 – 2019-04-07 (×22): 100 ug
  Filled 2019-03-16 (×23): qty 1

## 2019-03-16 MED ORDER — APIXABAN 5 MG PO TABS
5.0000 mg | ORAL_TABLET | Freq: Two times a day (BID) | ORAL | Status: DC
  Administered 2019-03-16 – 2019-03-17 (×3): 5 mg
  Filled 2019-03-16 (×3): qty 1

## 2019-03-16 MED ORDER — ASPIRIN 81 MG PO CHEW
81.0000 mg | CHEWABLE_TABLET | Freq: Every day | ORAL | Status: DC
  Administered 2019-03-16 – 2019-04-20 (×31): 81 mg
  Filled 2019-03-16 (×34): qty 1

## 2019-03-16 MED ORDER — VENLAFAXINE HCL 25 MG PO TABS
75.0000 mg | ORAL_TABLET | Freq: Two times a day (BID) | ORAL | Status: DC
  Administered 2019-03-16 – 2019-03-21 (×10): 75 mg
  Filled 2019-03-16 (×4): qty 3
  Filled 2019-03-16: qty 2
  Filled 2019-03-16: qty 3
  Filled 2019-03-16: qty 2
  Filled 2019-03-16 (×5): qty 3

## 2019-03-16 NOTE — Progress Notes (Signed)
PROGRESS NOTE    Jamie Arias  [id number] DOB: 12/25/1955 DOA: 02/09/2019 PCP: [name], MD   Brief Narrative:  Jamie Arias is a 65 y.o. white female with end-stage renal disease on hemodialysis, atrial fibrillation, chronic diastolic congestive heart failure, coronary artery disease status post CABG, diabetes mellitus type II, hypertension, hyperlipidemia, hypothyroidism, Covid positive in July 2020, recent nursing home stay in [place]. Prolonged hospitalization for MRSA bacteremia/sepsis and now with acute encephalopathy.  Subjective: No new c/o, Husband at bedside concerned about her  Assessment & Plan:   Principal Problem:   Sepsis due to methicillin resistant Staphylococcus aureus (MRSA) ([place]) Active Problems:   Type 2 diabetes mellitus with ESRD (end-stage renal disease) (HCC)   Atrial fibrillation, chronic (HCC)   Hypotension   Chronic diastolic CHF (congestive heart failure) (HCC)   ESRD (end stage renal disease) (HCC)   Altered level of consciousness   Decubitus ulcer of heel, bilateral, stage 2 (HCC)   MRSA bacteremia   Arm DVT (deep venous thromboembolism), acute, left (HCC)   Acute encephalopathy   Acute metabolic encephalopathy   Acute blood loss anemia   AF (paroxysmal atrial fibrillation) ([place])   Diarrhea   Oropharyngeal dysphagia   Abscess  Sepsis secondary to MRSA bacteremia.  No obvious source.  Right arm graft was removed on 03/04/2019. -Continue with vancomycin for 6-week, will finish her course on 04/01/2019.  Acute metabolic encephalopathy.  Mental status still impaired.  MRI of brain and EEG negative. Patient remained afebrile with no leukocytosis. Repeat TSH was elevated.  B12 within normal range, folate and B1 results are pending.  She was started on thiamine challenge yesterday by neurology. Will c/s neurology because of persistent encephalopathy. They do not think that she needs an LP, may be mental status is lagging  behind. -Increased dose of Synthroid to 100 mcg -Continue with thiamine challenge-will determine future need depending on B1 results. -Overall prognosis seems poor.  Bleeding from catheter site with multiple ecchymosis.  No more active bleeding .  Warfarin and heparin was discontinued yesterday of concern for necrosis of skin.  Hemoglobin stable around 7.5. -She was started on Eliquis .  Vaginal discharge.  There was some nursing concern of vaginal discharge  -there is some yeast but Gyn feels this may be colonization - may not need treatment. She is not symptomatic  Anemia.  Hemoglobin stable at 7.2 today.  Iron studies consistent with anemia of chronic illness.  MCV elevated. If Hb < 7, will transfuse -B12 within normal range -Continue EPO with dialysis.  Hypotension.  Resolved now  ESRD (M [facility]F).   Currently using tunneled catheter as graft was removed due to MRSA bacteremia. -Continue with scheduled dialysis.  Left arm DVT.  Patient was on Heparin.  Recent diagnosis of left arm DVT on 03/03/2019. -Discontinue heparin and Coumadin due to concern for Coumadin toxicity/side effect due to her skin necrotic lesions. -Started renally dosed Eliquis today  Hypothyroidism.  Repeat TSH elevated at 17.82. -Increased the dose of Synthroid to 100. - repeat TSH in 2 to 3 weeks.  Paroxysmal A. Fib.. Currently in sinus. Amiodarone was discontinued. -Continue anticoagulation with Eliquis.  Type 2 diabetes. -Continue SSI.  Chronic diastolic heart failure.  Volume being managed by dialysis.  Bilateral decubitus ulcers.  Present on admission. -Continue with wound care.  Nutrition.  PEG placed 03/03/2019.  Continue tube feeding.  Acute hypoxic respiratory failure.  Resolved.  Patient was intubated initially, extubated on 03/05/2019.  Objective: Vitals:   03/16/19 1330 03/16/19 1345 03/16/19 1400 03/16/19 1952  BP: 113/62 103/75 (!) 108/47 (!) 115/51  Pulse: 91 92 91 86  Resp: 16 19 16     Temp:   98.3 F (36.8 C) 98.7 F (37.1 C)  TempSrc:   Oral Oral  SpO2:   99% 98%  Weight:   86.5 kg   Height:        Intake/Output Summary (Last 24 hours) at 03/16/2019 2055 Last data filed at 03/16/2019 1617 Gross per 24 hour  Intake 1980.86 ml  Output 2100 ml  Net -119.14 ml   Filed Weights   03/10/19 1220 03/16/19 1100 03/16/19 1400  Weight: 87.3 kg 88.7 kg 86.5 kg    Examination:  General exam: Chronically ill-appearing lady, minimal response to painful stimuli, in no acute distress. Respiratory system: Clear to auscultation. Respiratory effort normal. Cardiovascular system: S1 & S2 heard, RRR. No JVD, murmurs, rubs, gallops or clicks. Gastrointestinal system: Soft, nontender, nondistended, bowel sounds positive. Central nervous system: Unable to assess as patient is minimally responsive and not following any commands. Extremities: Bilateral upper extremity edema, no cyanosis, pulses intact and symmetrical. Skin: Decubitus ulcers on both heels.  Multiple ecchymosis on all extremities and upper chest, appears healing now. Psychiatry: Judgement and insight appear impaired.  DVT prophylaxis: Eliquis Code Status: DNR Family Communication: Husband was updated at bedside. Disposition Plan: Waiting for bed offer at [facility].  Consultants:   Nephrology  Infectious disease  Vascular surgery  Neurology  Critical care specialist   Procedures:  Antimicrobials:  Vancomycin.  Data Reviewed: I have personally reviewed following labs and imaging studies  CBC: Recent Labs  Lab 03/12/19 0536 03/13/19 0618 03/14/19 0502 03/15/19 0518 03/16/19 0832  WBC 10.0 8.9 9.1 10.4 7.9  HGB 7.8* 8.5* 7.4* 7.5* 7.2*  HCT 24.7* 27.4* 24.2* 24.1* 23.4*  MCV 98.0 98.9 100.0 100.0 101.3*  PLT 279 262 235 263 [id number]   Basic Metabolic Panel: Recent Labs  Lab 03/11/19 0502 03/14/19 0502 03/15/19 0509 03/16/19 0832  NA 137 138 138 136  K 3.4* 2.8* 3.8 4.6  CL 102 104 101 101   CO2 29 30 26 25   GLUCOSE 151* 171* 203* 175*  BUN 33* 21 34* 50*  CREATININE 2.97* 1.90* 2.70* 3.44*  CALCIUM 8.4* 8.1* 8.1* 7.9*  MG 2.1  --   --   --   PHOS  --  2.0* 4.0 4.9*   GFR: Estimated Creatinine Clearance: 17.3 mL/min (A) (by C-G formula based on SCr of 3.44 mg/dL (H)). Liver Function Tests: Recent Labs  Lab 03/11/19 0502 03/14/19 0502 03/15/19 0509 03/16/19 0832  AST 17  --   --   --   ALT 7  --   --   --   ALKPHOS 74  --   --   --   BILITOT 0.7  --   --   --   PROT 5.0*  --   --   --   ALBUMIN 1.8* 1.8* 1.8* 1.7*   No results for input(s): LIPASE, AMYLASE in the last 168 hours. No results for input(s): AMMONIA in the last 168 hours. Coagulation Profile: Recent Labs  Lab 03/11/19 0502 03/12/19 0536 03/13/19 0618  INR 1.2 1.2 1.4*   Cardiac Enzymes: No results for input(s): CKTOTAL, CKMB, CKMBINDEX, TROPONINI in the last 168 hours. BNP (last 3 results) No results for input(s): PROBNP in the last 8760 hours. HbA1C: No results for input(s): HGBA1C in the last 72 hours.  CBG: Recent Labs  Lab 03/16/19 0050 03/16/19 0529 03/16/19 0738 03/16/19 1619 03/16/19 1949  GLUCAP 171* 147* 160* 89 122*   Lipid Profile: No results for input(s): CHOL, HDL, LDLCALC, TRIG, CHOLHDL, LDLDIRECT in the last 72 hours. Thyroid Function Tests: Recent Labs    03/15/19 0518 03/15/19 1443  TSH 17.828*  --   T4TOTAL  --  4.1*   Anemia Panel: Recent Labs    03/14/19 0502 03/15/19 0518  VITAMINB12  --  437  FERRITIN 1,664*  --   TIBC NOT CALCULATED  --   IRON 54  --    Sepsis Labs: No results for input(s): PROCALCITON, LATICACIDVEN in the last 168 hours.  Recent Results (from the past 240 hour(s))  Wet prep, genital     Status: Abnormal   Collection Time: 03/16/19 10:01 AM  Result Value Ref Range Status   Yeast Wet Prep HPF POC PRESENT (A) NONE SEEN Final   Trich, Wet Prep NONE SEEN NONE SEEN Final   Clue Cells Wet Prep HPF POC NONE SEEN NONE SEEN Final    WBC, Wet Prep HPF POC MANY (A) NONE SEEN Final   Sperm NONE SEEN  Final    Comment: Performed at [facility], [address], [place], [place] [address]     Radiology Studies: No results found.  Scheduled Meds: . apixaban  5 mg Per Tube BID  . aspirin  81 mg Per Tube Daily  . B-complex with vitamin C  1 tablet Per Tube Daily  . Chlorhexidine Gluconate Cloth  6 each Topical Q0600  . epoetin (EPOGEN/PROCRIT) injection  4,000 Units Intravenous Q M,W,F-HD  . feeding supplement (PRO-STAT SUGAR FREE 64)  30 mL Per Tube Daily  . fluticasone  2 spray Each Nare Daily  . free water  20 mL Per Tube Q4H  . insulin aspart  0-15 Units Subcutaneous Q4H  . [START ON 03/17/2019] levothyroxine  100 mcg Per Tube QAC breakfast  . mouth rinse  15 mL Mouth Rinse BID  . pantoprazole (PROTONIX) IV  40 mg Intravenous Q12H  . psyllium  1 packet Per Tube TID  . venlafaxine  75 mg Per Tube BID WC  . zinc sulfate  220 mg Per Tube Daily   Continuous Infusions: . sodium chloride 10 mL (03/15/19 1758)  . feeding supplement (OSMOLITE 1.5 CAL) 1,000 mL (03/16/19 1822)  . thiamine injection 500 mg (03/16/19 1810)  . vancomycin Stopped (03/16/19 1358)     LOS: 35 days   Time spent: 35 min.   Max Sane, MD [facility] Pager [phone]  If 7PM-7AM, please contact night-coverage [email or web address] Password [facility] 03/16/2019, 8:55 PM   This record has been created using Dragon voice recognition software. Errors have been sought and corrected,but may not always be located. Such creation errors do not reflect on the standard of care.

## 2019-03-16 NOTE — Progress Notes (Signed)
[facility]  ROUNDING NOTE   Subjective:   Seen and examined on hemodialysis. Opens eyes spontaneously.   Tolerating treatment well    HEMODIALYSIS FLOWSHEET:  Blood Flow Rate (mL/min): 400 mL/min Arterial Pressure (mmHg): -180 mmHg Venous Pressure (mmHg): 120 mmHg Transmembrane Pressure (mmHg): 40 mmHg Ultrafiltration Rate (mL/min): 830 mL/min Dialysate Flow Rate (mL/min): 600 ml/min Conductivity: Machine : 13.6 Conductivity: Machine : 13.6 Dialysis Fluid Bolus: Normal Saline Bolus Amount (mL): 250 mL Dialysate Change: 4K(Verbal order change by MD for low K+)    Objective:  Vital signs in last 24 hours:  Temp:  [98.7 F (37.1 C)-98.9 F (37.2 C)] 98.7 F (37.1 C) (01/11 0433) Pulse Rate:  [76-83] 83 (01/11 1215) Resp:  [15-20] 19 (01/11 1215) BP: (110-159)/(64-76) 110/67 (01/11 1215) SpO2:  [99 %-100 %] 99 % (01/11 1100) Weight:  [88.7 kg] 88.7 kg (01/11 1100)  Weight change:  Filed Weights   03/06/19 1500 03/10/19 1220 03/16/19 1100  Weight: 83 kg 87.3 kg 88.7 kg    Intake/Output: I/O last 3 completed shifts: In: 3052.8 [NG/GT:3002.8; IV Piggyback:50] Out: 800 [Stool:800]   Intake/Output this shift:  No intake/output data recorded.  Physical Exam: General: NAD, laying in bed  HEENT [place]/AT   Lungs:  Scatterd rhonchi, normal effort  Heart: Regular rate and rhythm  Abdomen:  Soft, nontender, PEG tube in place  Extremities: 2+ upper and lower extremity edema  Neurologic: Lethargic but arousable      Access: Left IJ PC      Basic Metabolic Panel: Recent Labs  Lab 03/11/19 0502 03/14/19 0502 03/15/19 0509 03/16/19 0832  NA 137 138 138 136  K 3.4* 2.8* 3.8 4.6  CL 102 104 101 101  CO2 29 30 26 25   GLUCOSE 151* 171* 203* 175*  BUN 33* 21 34* 50*  CREATININE 2.97* 1.90* 2.70* 3.44*  CALCIUM 8.4* 8.1* 8.1* 7.9*  MG 2.1  --   --   --   PHOS  --  2.0* 4.0 4.9*    Liver Function Tests: Recent Labs  Lab 03/11/19 0502  03/14/19 0502 03/15/19 0509 03/16/19 0832  AST 17  --   --   --   ALT 7  --   --   --   ALKPHOS 74  --   --   --   BILITOT 0.7  --   --   --   PROT 5.0*  --   --   --   ALBUMIN 1.8* 1.8* 1.8* 1.7*   No results for input(s): LIPASE, AMYLASE in the last 168 hours. No results for input(s): AMMONIA in the last 168 hours.  CBC: Recent Labs  Lab 03/12/19 0536 03/13/19 0618 03/14/19 0502 03/15/19 0518 03/16/19 0832  WBC 10.0 8.9 9.1 10.4 7.9  HGB 7.8* 8.5* 7.4* 7.5* 7.2*  HCT 24.7* 27.4* 24.2* 24.1* 23.4*  MCV 98.0 98.9 100.0 100.0 101.3*  PLT 279 262 235 263 218    Cardiac Enzymes: No results for input(s): CKTOTAL, CKMB, CKMBINDEX, TROPONINI in the last 168 hours.  BNP: Invalid input(s): POCBNP  CBG: Recent Labs  Lab 03/15/19 1751 03/15/19 1953 03/16/19 0050 03/16/19 0529 03/16/19 0738  GLUCAP 157* 141* 171* 147* 160*    Microbiology: Results for orders placed or performed during the hospital encounter of 02/09/19  Culture, blood (routine x 2)     Status: Abnormal   Collection Time: 02/09/19  2:29 PM   Specimen: BLOOD LEFT ARM  Result Value Ref Range Status  Specimen Description   Final    BLOOD LEFT ARM Performed at [facility], [address], [place], [place] [address]    Special Requests   Final    BOTTLES DRAWN AEROBIC AND ANAEROBIC Blood Culture adequate volume Performed at [facility], [address] [place], [place] [address]    Culture  Setup Time   Final    GRAM POSITIVE COCCI IN BOTH AEROBIC AND ANAEROBIC BOTTLES CRITICAL VALUE NOTED.  VALUE IS CONSISTENT WITH PREVIOUSLY REPORTED AND CALLED VALUE. Performed at [facility], [address] [place], [place] [address]    Culture (A)  Final    STAPHYLOCOCCUS AUREUS SUSCEPTIBILITIES PERFORMED ON PREVIOUS CULTURE WITHIN THE LAST 5 DAYS. Performed at [facility], [place] [address], [place], [place] [address]    Report Status 02/12/2019 FINAL  Final  Culture,  blood (routine x 2)     Status: Abnormal   Collection Time: 02/09/19  2:38 PM   Specimen: BLOOD LEFT ARM  Result Value Ref Range Status   Specimen Description   Final    BLOOD LEFT ARM Performed at [facility], [address], [place], [place] [address]    Special Requests   Final    BOTTLES DRAWN AEROBIC AND ANAEROBIC Blood Culture adequate volume Performed at [facility], [address] [place], [place] [address]    Culture  Setup Time   Final    GRAM POSITIVE COCCI IN BOTH AEROBIC AND ANAEROBIC BOTTLES CRITICAL RESULT CALLED TO, READ BACK BY AND VERIFIED WITH: [place] [id number] 02/10/2019 HNM Performed at [facility], [place] [address] [place], [place] [address]    Culture METHICILLIN RESISTANT STAPHYLOCOCCUS AUREUS (A)  Final   Report Status 02/12/2019 FINAL  Final   Organism ID, Bacteria METHICILLIN RESISTANT STAPHYLOCOCCUS AUREUS  Final      Susceptibility   Methicillin resistant staphylococcus aureus - MIC*    CIPROFLOXACIN >=8 RESISTANT Resistant     ERYTHROMYCIN >=8 RESISTANT Resistant     GENTAMICIN <=0.5 SENSITIVE Sensitive     OXACILLIN >=4 RESISTANT Resistant     TETRACYCLINE <=1 SENSITIVE Sensitive     VANCOMYCIN 1 SENSITIVE Sensitive     TRIMETH/SULFA <=10 SENSITIVE Sensitive     CLINDAMYCIN RESISTANT Resistant     RIFAMPIN <=0.5 SENSITIVE Sensitive     Inducible Clindamycin POSITIVE Resistant     * METHICILLIN RESISTANT STAPHYLOCOCCUS AUREUS  Blood Culture ID Panel (Reflexed)     Status: Abnormal   Collection Time: 02/09/19  2:38 PM  Result Value Ref Range Status   Enterococcus species NOT DETECTED NOT DETECTED Final   Listeria monocytogenes NOT DETECTED NOT DETECTED Final   Staphylococcus species DETECTED (A) NOT DETECTED Final    Comment: CRITICAL RESULT CALLED TO, READ BACK BY AND VERIFIED WITH: Hart Robinsons PHARMD [id number] 02/10/2019 HNM    Staphylococcus aureus (BCID) DETECTED (A) NOT DETECTED Final    Comment: Methicillin  (oxacillin)-resistant Staphylococcus aureus (MRSA). MRSA is predictably resistant to beta-lactam antibiotics (except ceftaroline). Preferred therapy is vancomycin unless clinically contraindicated. Patient requires contact precautions if  hospitalized. CRITICAL RESULT CALLED TO, READ BACK BY AND VERIFIED WITH: Hart Robinsons PHARMD [id number] 02/10/2019 HNM    Methicillin resistance DETECTED (A) NOT DETECTED Final    Comment: CRITICAL RESULT CALLED TO, READ BACK BY AND VERIFIED WITH: Hart Robinsons PHARMD [id number] 02/10/2019 HNM    Streptococcus species NOT DETECTED NOT DETECTED Final   Streptococcus agalactiae NOT DETECTED NOT DETECTED Final  Streptococcus pneumoniae NOT DETECTED NOT DETECTED Final   Streptococcus pyogenes NOT DETECTED NOT DETECTED Final   Acinetobacter baumannii NOT DETECTED NOT DETECTED Final   Enterobacteriaceae species NOT DETECTED NOT DETECTED Final   Enterobacter cloacae complex NOT DETECTED NOT DETECTED Final   Escherichia coli NOT DETECTED NOT DETECTED Final   Klebsiella oxytoca NOT DETECTED NOT DETECTED Final   Klebsiella pneumoniae NOT DETECTED NOT DETECTED Final   Proteus species NOT DETECTED NOT DETECTED Final   Serratia marcescens NOT DETECTED NOT DETECTED Final   Haemophilus influenzae NOT DETECTED NOT DETECTED Final   Neisseria meningitidis NOT DETECTED NOT DETECTED Final   Pseudomonas aeruginosa NOT DETECTED NOT DETECTED Final   Candida albicans NOT DETECTED NOT DETECTED Final   Candida glabrata NOT DETECTED NOT DETECTED Final   Candida krusei NOT DETECTED NOT DETECTED Final   Candida parapsilosis NOT DETECTED NOT DETECTED Final   Candida tropicalis NOT DETECTED NOT DETECTED Final    Comment: Performed at [facility], [place], [place] [address]  SARS CORONAVIRUS 2 (TAT 6-24 HRS) Nasopharyngeal Nasopharyngeal Swab     Status: None   Collection Time: 02/09/19  2:54 PM   Specimen: Nasopharyngeal Swab  Result Value Ref Range Status   SARS  Coronavirus 2 NEGATIVE NEGATIVE Final    Comment: (NOTE) SARS-CoV-2 target nucleic acids are NOT DETECTED. The SARS-CoV-2 RNA is generally detectable in upper and lower respiratory specimens during the acute phase of infection. Negative results do not preclude SARS-CoV-2 infection, do not rule out co-infections with other pathogens, and should not be used as the sole basis for treatment or other patient management decisions. Negative results must be combined with clinical observations, patient history, and epidemiological information. The expected result is Negative. Fact Sheet for Patients: SugarRoll.be Fact Sheet for Healthcare Providers: https://www.woods-mathews.com/ This test is not yet approved or cleared by the [place] FDA and  has been authorized for detection and/or diagnosis of SARS-CoV-2 by FDA under an Emergency Use Authorization (EUA). This EUA will remain  in effect (meaning this test can be used) for the duration of the COVID-19 declaration under Section 56 4(b)(1) of the Act, 21 U.S.C. section 360bbb-3(b)(1), unless the authorization is terminated or revoked sooner. Performed at [facility], [place] [address] [place], [place] [address]   MRSA PCR Screening     Status: Abnormal   Collection Time: 02/10/19  5:03 PM   Specimen: Nasopharyngeal  Result Value Ref Range Status   MRSA by PCR POSITIVE (A) NEGATIVE Final    Comment:        The GeneXpert MRSA Assay (FDA approved for NASAL specimens only), is one component of a comprehensive MRSA colonization surveillance program. It is not intended to diagnose MRSA infection nor to guide or monitor treatment for MRSA infections. RESULT CALLED TO, READ BACK BY AND VERIFIED WITH: ALEKSEY FRASER @1839  02/10/19 MJU Performed at [facility], [address] [place], [place] [address]   Culture, blood (routine x 2)     Status: Abnormal   Collection Time:  02/11/19 12:31 PM   Specimen: BLOOD  Result Value Ref Range Status   Specimen Description   Final    BLOOD PORTA CATH Performed at [place] [address], [place], [place] [address]    Special Requests   Final    BOTTLES DRAWN AEROBIC AND ANAEROBIC Blood Culture adequate volume Performed at [facility], [address], [place],  [address]    Culture  Setup Time   Final    GRAM POSITIVE COCCI ANAEROBIC BOTTLE ONLY CRITICAL RESULT CALLED TO, READ BACK BY AND VERIFIED WITH: SCOTT HALL AT [id number] 02/12/2019 [facility]  Performed at [facility], [address] [place], [place] [address]    Culture (A)  Final    STAPHYLOCOCCUS AUREUS SUSCEPTIBILITIES PERFORMED ON PREVIOUS CULTURE WITHIN THE LAST 5 DAYS. Performed at [facility], [place] [address] [place], [place] [address]    Report Status 02/13/2019 FINAL  Final  Culture, blood (routine x 2)     Status: None   Collection Time: 02/11/19  3:57 PM   Specimen: BLOOD  Result Value Ref Range Status   Specimen Description BLOOD PORTA CATH  Final   Special Requests   Final    BOTTLES DRAWN AEROBIC AND ANAEROBIC Blood Culture adequate volume   Culture   Final    NO GROWTH 5 DAYS Performed at [facility], [address] [place], [place] [address]    Report Status 02/16/2019 FINAL  Final  CULTURE, BLOOD (ROUTINE X 2) w Reflex to ID Panel     Status: Abnormal   Collection Time: 02/13/19  2:18 PM   Specimen: BLOOD LEFT HAND  Result Value Ref Range Status   Specimen Description BLOOD LEFT HAND  Final   Special Requests   Final    BOTTLES DRAWN AEROBIC AND ANAEROBIC Blood Culture results may not be optimal due to an inadequate volume of blood received in culture bottles   Culture  Setup Time   Final    AEROBIC BOTTLE ONLY GRAM POSITIVE COCCI IN CLUSTERS CRITICAL RESULT CALLED TO, READ BACK BY AND VERIFIED WITH: Herbert Pun AT [id number] ON 02/14/2019 [place]. Performed at [facility],  [place] [address], [place], [place] [address]    Culture METHICILLIN RESISTANT STAPHYLOCOCCUS AUREUS (A)  Final   Report Status 02/16/2019 FINAL  Final   Organism ID, Bacteria METHICILLIN RESISTANT STAPHYLOCOCCUS AUREUS  Final      Susceptibility   Methicillin resistant staphylococcus aureus - MIC*    CIPROFLOXACIN >=8 RESISTANT Resistant     ERYTHROMYCIN >=8 RESISTANT Resistant     GENTAMICIN <=0.5 SENSITIVE Sensitive     OXACILLIN >=4 RESISTANT Resistant     TETRACYCLINE <=1 SENSITIVE Sensitive     VANCOMYCIN <=0.5 SENSITIVE Sensitive     TRIMETH/SULFA <=10 SENSITIVE Sensitive     CLINDAMYCIN RESISTANT Resistant     RIFAMPIN <=0.5 SENSITIVE Sensitive     Inducible Clindamycin POSITIVE Resistant     * METHICILLIN RESISTANT STAPHYLOCOCCUS AUREUS  Blood Culture ID Panel (Reflexed)     Status: Abnormal   Collection Time: 02/13/19  2:18 PM  Result Value Ref Range Status   Enterococcus species NOT DETECTED NOT DETECTED Final   Listeria monocytogenes NOT DETECTED NOT DETECTED Final   Staphylococcus species DETECTED (A) NOT DETECTED Final    Comment: CRITICAL RESULT CALLED TO, READ BACK BY AND VERIFIED WITH: ABBEY ELLINGTON AT [id number] ON 02/14/2019 [place].    Staphylococcus aureus (BCID) DETECTED (A) NOT DETECTED Final    Comment: Methicillin (oxacillin)-resistant Staphylococcus aureus (MRSA). MRSA is predictably resistant to beta-lactam antibiotics (except ceftaroline). Preferred therapy is vancomycin unless clinically contraindicated. Patient requires contact precautions if  hospitalized. CRITICAL RESULT CALLED TO, READ BACK BY AND VERIFIED WITH: Herbert Pun AT [id number] ON 02/14/2019 [place].    Methicillin resistance DETECTED (A) NOT DETECTED Final    Comment: CRITICAL RESULT CALLED TO, READ BACK BY AND VERIFIED WITH: Herbert Pun  AT [id number] ON 02/14/2019 [place].    Streptococcus species NOT DETECTED NOT DETECTED Final   Streptococcus agalactiae NOT DETECTED NOT DETECTED Final   Streptococcus  pneumoniae NOT DETECTED NOT DETECTED Final   Streptococcus pyogenes NOT DETECTED NOT DETECTED Final   Acinetobacter baumannii NOT DETECTED NOT DETECTED Final   Enterobacteriaceae species NOT DETECTED NOT DETECTED Final   Enterobacter cloacae complex NOT DETECTED NOT DETECTED Final   Escherichia coli NOT DETECTED NOT DETECTED Final   Klebsiella oxytoca NOT DETECTED NOT DETECTED Final   Klebsiella pneumoniae NOT DETECTED NOT DETECTED Final   Proteus species NOT DETECTED NOT DETECTED Final   Serratia marcescens NOT DETECTED NOT DETECTED Final   Haemophilus influenzae NOT DETECTED NOT DETECTED Final   Neisseria meningitidis NOT DETECTED NOT DETECTED Final   Pseudomonas aeruginosa NOT DETECTED NOT DETECTED Final   Candida albicans NOT DETECTED NOT DETECTED Final   Candida glabrata NOT DETECTED NOT DETECTED Final   Candida krusei NOT DETECTED NOT DETECTED Final   Candida parapsilosis NOT DETECTED NOT DETECTED Final   Candida tropicalis NOT DETECTED NOT DETECTED Final    Comment: Performed at [facility], [address] [place], [place] [address]  CULTURE, BLOOD (ROUTINE X 2) w Reflex to ID Panel     Status: None   Collection Time: 02/13/19  3:09 PM   Specimen: BLOOD  Result Value Ref Range Status   Specimen Description BLOOD BLOOD LEFT HAND  Final   Special Requests   Final    BOTTLES DRAWN AEROBIC AND ANAEROBIC Blood Culture adequate volume   Culture   Final    NO GROWTH 5 DAYS Performed at [facility], [address], [place], [place] [address]    Report Status 02/18/2019 FINAL  Final  Culture, blood (routine x 2)     Status: Abnormal   Collection Time: 02/15/19 12:49 PM   Specimen: BLOOD  Result Value Ref Range Status   Specimen Description   Final    BLOOD LT HAND Performed at [facility], [address] [place], [place] [address]    Special Requests   Final    BOTTLES DRAWN AEROBIC AND ANAEROBIC Blood Culture results may not be optimal due to  an inadequate volume of blood received in culture bottles Performed at [facility], [address] [place], [place] [address]    Culture  Setup Time   Final    GRAM POSITIVE COCCI IN BOTH AEROBIC AND ANAEROBIC BOTTLES CRITICAL RESULT CALLED TO, READ BACK BY AND VERIFIED WITHEleonore Chiquito AT [id number] 02/16/2019 SDR Performed at [facility], [address] [place], [place] [address]    Culture (A)  Final    METHICILLIN RESISTANT STAPHYLOCOCCUS AUREUS STAPHYLOCOCCUS SPECIES (COAGULASE NEGATIVE) THE SIGNIFICANCE OF ISOLATING THIS ORGANISM FROM A SINGLE SET OF BLOOD CULTURES WHEN MULTIPLE SETS ARE DRAWN IS UNCERTAIN. PLEASE NOTIFY THE MICROBIOLOGY DEPARTMENT WITHIN ONE WEEK IF SPECIATION AND SENSITIVITIES ARE REQUIRED. Performed at [facility], [place] [address], [place], [place] [address]    Report Status 02/18/2019 FINAL  Final   Organism ID, Bacteria METHICILLIN RESISTANT STAPHYLOCOCCUS AUREUS  Final      Susceptibility   Methicillin resistant staphylococcus aureus - MIC*    CIPROFLOXACIN >=8 RESISTANT Resistant     ERYTHROMYCIN >=8 RESISTANT Resistant     GENTAMICIN <=0.5 SENSITIVE Sensitive     OXACILLIN >=4 RESISTANT Resistant     TETRACYCLINE <=1 SENSITIVE Sensitive     VANCOMYCIN <=0.5 SENSITIVE Sensitive  TRIMETH/SULFA <=10 SENSITIVE Sensitive     CLINDAMYCIN RESISTANT Resistant     RIFAMPIN <=0.5 SENSITIVE Sensitive     Inducible Clindamycin POSITIVE Resistant     * METHICILLIN RESISTANT STAPHYLOCOCCUS AUREUS  Aerobic Culture (superficial specimen)     Status: None   Collection Time: 02/17/19  1:31 PM   Specimen: Heel; Wound  Result Value Ref Range Status   Specimen Description   Final    HEEL Performed at [facility], [address], [place], [place] [address]    Special Requests   Final    NONE Performed at [facility], [address] [place], [place] [address]    Gram Stain   Final    NO WBC SEEN MODERATE GRAM  POSITIVE COCCI IN PAIRS    Culture   Final    ABUNDANT METHICILLIN RESISTANT STAPHYLOCOCCUS AUREUS ABUNDANT DIPHTHEROIDS(CORYNEBACTERIUM SPECIES) Standardized susceptibility testing for this organism is not available. Performed at [facility], [place] [address], [place], [place] [address]    Report Status 02/19/2019 FINAL  Final   Organism ID, Bacteria METHICILLIN RESISTANT STAPHYLOCOCCUS AUREUS  Final      Susceptibility   Methicillin resistant staphylococcus aureus - MIC*    CIPROFLOXACIN >=8 RESISTANT Resistant     ERYTHROMYCIN >=8 RESISTANT Resistant     GENTAMICIN <=0.5 SENSITIVE Sensitive     OXACILLIN >=4 RESISTANT Resistant     TETRACYCLINE <=1 SENSITIVE Sensitive     VANCOMYCIN 1 SENSITIVE Sensitive     TRIMETH/SULFA <=10 SENSITIVE Sensitive     CLINDAMYCIN RESISTANT Resistant     RIFAMPIN <=0.5 SENSITIVE Sensitive     Inducible Clindamycin POSITIVE Resistant     * ABUNDANT METHICILLIN RESISTANT STAPHYLOCOCCUS AUREUS  CULTURE, BLOOD (ROUTINE X 2) w Reflex to ID Panel     Status: None   Collection Time: 02/18/19 11:29 PM   Specimen: BLOOD  Result Value Ref Range Status   Specimen Description BLOOD LEFT ARM  Final   Special Requests   Final    BOTTLES DRAWN AEROBIC AND ANAEROBIC Blood Culture adequate volume   Culture   Final    NO GROWTH 5 DAYS Performed at [facility], [address] [place], [place] [address]    Report Status 02/23/2019 FINAL  Final  CULTURE, BLOOD (ROUTINE X 2) w Reflex to ID Panel     Status: None   Collection Time: 02/18/19 11:29 PM   Specimen: BLOOD  Result Value Ref Range Status   Specimen Description BLOOD LEFT ARM  Final   Special Requests   Final    BOTTLES DRAWN AEROBIC AND ANAEROBIC Blood Culture adequate volume   Culture   Final    NO GROWTH 5 DAYS Performed at [facility], [address] [place], [place] [address]    Report Status 02/23/2019 FINAL  Final  Aerobic/Anaerobic Culture (surgical/deep wound)      Status: None   Collection Time: 03/04/19 11:04 AM   Specimen: PATH Other; Tissue  Result Value Ref Range Status   Specimen Description   Final    ARTERY Performed at [facility], [address], [place], [place] [address]    Special Requests   Final    RIGHT AV GRAFT AND STENTS CULTURE Performed at [facility], [address] [place], [place] [address]    Gram Stain   Final    RARE WBC PRESENT, PREDOMINANTLY PMN NO ORGANISMS SEEN    Culture   Final  No growth aerobically or anaerobically. Performed at [facility], [place] [address] [place], [place] [address]    Report Status 03/09/2019 FINAL  Final  Aerobic/Anaerobic Culture (surgical/deep wound)     Status: None   Collection Time: 03/04/19 11:29 AM   Specimen: PATH Other; Tissue  Result Value Ref Range Status   Specimen Description   Final    ARTERY Performed at [facility], [address], [place], [place] [address]    Special Requests   Final    FLUID AROUND AV GRAFT Performed at [facility], [address] [place], [place] [address]    Gram Stain   Final    FEW WBC PRESENT, PREDOMINANTLY PMN NO ORGANISMS SEEN    Culture   Final    No growth aerobically or anaerobically. Performed at [facility], [place] [address], [place], [place] [address]    Report Status 03/09/2019 FINAL  Final  Wet prep, genital     Status: Abnormal   Collection Time: 03/16/19 10:01 AM  Result Value Ref Range Status   Yeast Wet Prep HPF POC PRESENT (A) NONE SEEN Final   Trich, Wet Prep NONE SEEN NONE SEEN Final   Clue Cells Wet Prep HPF POC NONE SEEN NONE SEEN Final   WBC, Wet Prep HPF POC MANY (A) NONE SEEN Final   Sperm NONE SEEN  Final    Comment: Performed at [facility], [address] [place], [place] [address]    Coagulation Studies: No results for input(s): LABPROT, INR in the last 72 hours.  Urinalysis: No results for input(s): COLORURINE, LABSPEC, PHURINE,  GLUCOSEU, HGBUR, BILIRUBINUR, KETONESUR, PROTEINUR, UROBILINOGEN, NITRITE, LEUKOCYTESUR in the last 72 hours.  Invalid input(s): APPERANCEUR    Imaging: No results found.   Medications:   . sodium chloride 10 mL (03/15/19 1758)  . feeding supplement (OSMOLITE 1.5 CAL) 1,000 mL (03/15/19 1618)  . thiamine injection 500 mg (03/16/19 0920)  . vancomycin 750 mg (03/13/19 1958)   . apixaban  5 mg Per Tube BID  . aspirin  81 mg Per Tube Daily  . B-complex with vitamin C  1 tablet Per Tube Daily  . Chlorhexidine Gluconate Cloth  6 each Topical Q0600  . epoetin (EPOGEN/PROCRIT) injection  4,000 Units Intravenous Q M,W,F-HD  . feeding supplement (PRO-STAT SUGAR FREE 64)  30 mL Per Tube Daily  . fluticasone  2 spray Each Nare Daily  . free water  20 mL Per Tube Q4H  . insulin aspart  0-15 Units Subcutaneous Q4H  . [START ON 03/17/2019] levothyroxine  100 mcg Per Tube QAC breakfast  . mouth rinse  15 mL Mouth Rinse BID  . pantoprazole (PROTONIX) IV  40 mg Intravenous Q12H  . psyllium  1 packet Per Tube TID  . venlafaxine  75 mg Per Tube BID WC  . zinc sulfate  220 mg Per Tube Daily   sodium chloride, acetaminophen, ipratropium-albuterol, ondansetron (ZOFRAN) IV, traMADol  Assessment/ Plan:  Jamie Arias is a 64 y.o. white female with end-stage renal disease on hemodialysis, atrial fibrillation, chronic diastolic congestive heart failure, coronary artery disease status post CABG, diabetes mellitus type II, hypertension, hyperlipidemia, hypothyroidism, Covid positive in July 2020, recent nursing home stay in [place] Prolonged hospitalization for MRSA bacteremia/sepsis and now with acute encephalopathy  [facility] MWF [facility] [address] Left AVG 81.5kg  1. End Stage Renal Disease: on hemodialysis. With complication of dialysis device. Right arm AVG removed 12/30 due  to suspected source of bacteremia Now with left IJ permcath - Continue MWF schedule   2. Hypotension -Continue  midodrine 10 mg 3 times daily.  3. Anemia of chronic kidney disease:  Status post PRBC transfusion on 12/22 EPO with HD treatments.   4. Secondary Hyperparathyroidism: with hypophosphatemia - IV replacement of phosphorus.   5. Acute Encephalopathy: No significant change in mental status.   - Appreciate Neurology input. Showed some improvement between both EEGs - Thiamine   LOS: 35 Jamie Arias 1/11/202112:34 PM

## 2019-03-16 NOTE — Progress Notes (Signed)
This note also relates to the following rows which could not be included: Pulse Rate - Cannot attach notes to unvalidated device data Resp - Cannot attach notes to unvalidated device data BP - Cannot attach notes to unvalidated device data  Hd completed  

## 2019-03-16 NOTE — Progress Notes (Signed)
Informed by lab early this morning that wet prep done by theOB-Gyn physician during the day needs to be retested for they were just getting around to run it around 1am and noticed no label on wet prep sample. Informed Mother-Baby Nurse to inform OB-Gyn physician on-call. OB-Gyn physician states they will inform the day shift OB-Gyn that the wet prep for the patient needs to be redone.  Patient continues to have green liquidy discharge with strong odor. Patient turned every 2 hours. Dressing changed to the right upper per dressing change order. Patient continues to tolerate tube feedings with no nausea or vomiting noted. Currently patient is resting. Will continue to monitor patient to end of shift.

## 2019-03-16 NOTE — Progress Notes (Signed)
This note also relates to the following rows which could not be included: Resp - Cannot attach notes to unvalidated device data BP - Cannot attach notes to unvalidated device data  Hd started  

## 2019-03-16 NOTE — Progress Notes (Signed)
Daily Progress Note   Patient Name: Jamie Arias       Date: 03/16/2019 DOB: Jul 06, 1955  Age: 64 y.o. MRN#: [id number] Attending Physician: Max Sane, MD Primary Care Physician: [name], MD Admit Date: 02/09/2019  Reason for Consultation/Follow-up: Establishing goals of care  Subjective: Patient opens eyes but does not track and stares at ceiling. She does not speak. Spoke with husband [name]. He states yesterday she opened her eyes and made facial expressions. He states she would squeeze hand. He states she said "Oh my God" about 3 times, which he states she typically says when she is in pain. He tells me he is aware yesterday she received Tylenol.  Discussed her mental status, different pain management options and sedative effects of narcotic pain medications. We discussed QOL. He states her pain was not that bad. He is thankful for all of the consults and care his wife is receiving, and remains hopeful. He states he continues to have conversations with her children.     Length of Stay: 35  Current Medications: Scheduled Meds:  . apixaban  5 mg Per Tube BID  . aspirin  81 mg Per Tube Daily  . B-complex with vitamin C  1 tablet Per Tube Daily  . Chlorhexidine Gluconate Cloth  6 each Topical Q0600  . epoetin (EPOGEN/PROCRIT) injection  4,000 Units Intravenous Q M,W,F-HD  . feeding supplement (PRO-STAT SUGAR FREE 64)  30 mL Per Tube Daily  . fluticasone  2 spray Each Nare Daily  . free water  20 mL Per Tube Q4H  . insulin aspart  0-15 Units Subcutaneous Q4H  . [START ON 03/17/2019] levothyroxine  100 mcg Per Tube QAC breakfast  . mouth rinse  15 mL Mouth Rinse BID  . pantoprazole (PROTONIX) IV  40 mg Intravenous Q12H  . psyllium  1 packet Per Tube TID  . venlafaxine  75 mg Per  Tube BID WC  . zinc sulfate  220 mg Per Tube Daily    Continuous Infusions: . sodium chloride 10 mL (03/15/19 1758)  . feeding supplement (OSMOLITE 1.5 CAL) 1,000 mL (03/15/19 1618)  . thiamine injection 500 mg (03/16/19 0920)  . vancomycin 750 mg (03/13/19 1958)    PRN Meds: sodium chloride, acetaminophen, ipratropium-albuterol, ondansetron (ZOFRAN) IV, traMADol  Physical Exam Constitutional:  Comments: Eyes opened and stared at ceiling.   Pulmonary:     Effort: Pulmonary effort is normal.             Vital Signs: BP 137/64 (BP Location: Left Leg)   Pulse 80   Temp 98.7 F (37.1 C) (Oral)   Resp 20   Ht 5' 4.02" (1.626 m)   Wt 87.3 kg   SpO2 100%   BMI 33.02 kg/m  SpO2: SpO2: 100 % O2 Device: O2 Device: Room Air O2 Flow Rate: O2 Flow Rate (L/min): 1 L/min  Intake/output summary:   Intake/Output Summary (Last 24 hours) at 03/16/2019 1041 Last data filed at 03/16/2019 0640 Gross per 24 hour  Intake 1400 ml  Output 100 ml  Net 1300 ml   LBM: Last BM Date: 03/15/19 Baseline Weight: Weight: 59 kg Most recent weight: Weight: 87.3 kg       Palliative Assessment/Data:     Flowsheet Rows     Most Recent Value  Intake Tab  Referral Department  Hospitalist  Unit at Time of Referral  ICU  Palliative Care Primary Diagnosis  Neurology  Date Notified  02/18/19  Palliative Care Type  New Palliative care  Reason for referral  Clarify Goals of Care  Date of Admission  02/09/19  Date first seen by Palliative Care  02/19/19  # of days Palliative referral response time  1 Day(s)  # of days IP prior to Palliative referral  9  Clinical Assessment  Psychosocial & Spiritual Assessment  Palliative Care Outcomes      Patient Active Problem List   Diagnosis Date Noted  . Abscess   . AF (paroxysmal atrial fibrillation) ([place])   . Diarrhea   . Oropharyngeal dysphagia   . Acute metabolic encephalopathy   . Acute blood loss anemia   . Arm DVT (deep venous  thromboembolism), acute, left ([place]) 02/13/2019  . Acute encephalopathy 02/13/2019  . Decubitus ulcer of heel, bilateral, stage 2 ([place]) 02/10/2019  . Sepsis due to methicillin resistant Staphylococcus aureus (MRSA) ([place]) 02/10/2019  . MRSA bacteremia 02/10/2019  . Altered level of consciousness 02/09/2019  . Adjustment disorder with depressed mood 08/31/2018  . COVID-19 virus infection 08/24/2018  . Acute on chronic respiratory failure with hypoxia ([place]) 08/24/2018  . UTI (urinary tract infection) 08/24/2018  . Allergic rhinitis 09/19/2017  . Uremia of renal origin 09/04/2017  . Chronic neck pain 07/25/2017  . Hyperkalemia 02/15/2017  . Complication from renal dialysis device 12/01/2016  . ESRD (end stage renal disease) ([place]) 01/19/2016  . GI bleed 10/25/2015  . Hypertensive heart disease 10/24/2015  . Chronic diastolic CHF (congestive heart failure) ([place]) 10/24/2015  . Unstable angina ([place]) 10/23/2015  . Hypotension 09/02/2015  . Chronic systolic CHF (congestive heart failure) ([place]) 09/02/2015  . Depression 07/27/2015  . Calculus of gallbladder with chronic cholecystitis without obstruction   . Bilateral carotid bruits 11/30/2014  . Low magnesium levels 08/10/2013  . Anemia 08/10/2013  . Constipation 08/10/2013  . Atrial fibrillation, chronic ([place]) 07/30/2013  . Coronary artery disease   . CAD (coronary artery disease) 07/02/2013  . Acute systolic heart failure ([place]) 06/30/2013  . NSTEMI (non-ST elevated myocardial infarction) ([place]) 06/29/2013  . Vertigo 08/25/2012  . Sleep disorder 05/23/2012  . Hypothyroid 04/27/2012  . HTN (hypertension) 04/27/2012  . HLD (hyperlipidemia) 04/27/2012  . Type 2 diabetes mellitus with ESRD (end-stage renal disease) ([place]) 04/27/2012  . Neuropathy 04/27/2012    Palliative Care Assessment & Plan    Recommendations/Plan:  DNR/DNI  Continue to treat the treatable.    Code Status:    Code Status Orders  (From admission, onward)          Start     Ordered   02/21/19 0930  Do not attempt resuscitation (DNR)  Continuous    Question Answer Comment  In the event of cardiac or respiratory ARREST Do not call a "code blue"   In the event of cardiac or respiratory ARREST Do not perform Intubation, CPR, defibrillation or ACLS   In the event of cardiac or respiratory ARREST Use medication by any route, position, wound care, and other measures to relive pain and suffering. May use oxygen, suction and manual treatment of airway obstruction as needed for comfort.      02/21/19 0932        Code Status History    Date Active Date Inactive Code Status Order ID Comments User Context   02/09/2019 [id number] 02/21/2019 0932 Full Code [id number]  Thornell Mule, MD ED   08/24/2018 0022 09/15/2018 1720 Full Code [id number]  Shela Leff, MD Inpatient   09/04/2017 1510 09/06/2017 1920 Full Code [id number]  Epifanio Lesches, MD ED   02/15/2017 1243 02/16/2017 2023 Full Code [id number]  Saundra Shelling, MD Inpatient   10/23/2015 0828 10/23/2015 0908 Full Code [id number]  Saundra Shelling, MD Inpatient   09/03/2015 0130 09/03/2015 1619 Full Code [id number]  Lance Coon, MD Inpatient   07/02/2013 1327 07/14/2013 1540 Full Code [id number]  Nani Skillern, PA-C Inpatient   06/29/2013 2153 07/02/2013 1327 Full Code [id number]  Larey Dresser, MD Inpatient   Advance Care Planning Activity       Prognosis:  Poor overall   Thank you for allowing the Palliative Medicine Team to assist in the care of this patient.   Total Time 25 min Prolonged Time Billed  no      Greater than 50%  of this time was spent counseling and coordinating care related to the above assessment and plan.  Asencion Gowda, NP  Please contact Palliative Medicine Team phone at [phone] for questions and concerns.

## 2019-03-16 NOTE — Plan of Care (Signed)

## 2019-03-16 NOTE — Progress Notes (Signed)
Nutrition Follow-up  DOCUMENTATION CODES:   Obesity unspecified  INTERVENTION:   Continue Osmolite 1.5 '@50ml'$ /hr + prostat liquid protein 30 ml daily via tube, supplement provides 100 kcal, 15 grams protein.  Free water flushes 15m q4 hours to maintain tube patency   Regimen provides 1900kcal/day, 90g/day protein, 10936mday free water.   B-complex with C daily via tube  Metamucil 1 packet TID via tube   NUTRITION DIAGNOSIS:   Increased nutrient needs related to chronic illness(ESRD on HD) as evidenced by estimated needs. Ongoing.  GOAL:   Patient will meet greater than or equal to 90% of their needs -met with tube feeds   MONITOR:   Labs, Weight trends, TF tolerance, Skin, I & O's  ASSESSMENT:   [age].o. female with end-stage renal disease on HD, atrial fibrillation, chronic diastolic congestive heart failure, CAD status post PCI, CABG,  type 2 diabetes, hypertension, hyperlipidemia, hypothyroidism, Covid positive in July 2020 admitted with AMS and found to have MRSA positive blood cultures   Pt s/p PEG placement 12/29  Pt tolerating tube feeds well at goal rate via PEG. Pt continues to have diarrhea; will increase metamucil to 3 times daily. Pt continues to be somnolent. Per chart, pt's weight appeared to be stabilizing with addition of tube feeds; however pt's weight is now back up higher than her admit weight. RD unsure if bed weights are accurate.   Medications reviewed and include: aspirin, B complex with C, epogen, insulin, synthroid, protonix, psyllium, zinc, thiamine, vancomycin   Labs reviewed: BUN 50(H), creat 3.44(H), P 4.9(H) Hgb 7.2(L), Hct 23.4(L), MCV 101.3(H) cbgs- 171, 147, 160 x 24 hrs  Diet Order:   Diet Order            Diet NPO time specified  Diet effective now             EDUCATION NEEDS:   Not appropriate for education at this time  Skin:  Skin Assessment: Skin Integrity Issues:(stg II right buttocks; DTI right heel; unstageable left  heel)  Last BM:  1/11- type 7  Height:   Ht Readings from Last 1 Encounters:  03/04/19 5' 4.02" (1.626 m)   Weight:   Wt Readings from Last 1 Encounters:  03/16/19 88.7 kg   Ideal Body Weight:  45 kg  BMI:  Body mass index is 33.55 kg/m.  Estimated Nutritional Needs:   Kcal:  1700-1900kcal/day  Protein:  85-95g/day  Fluid:  UOP +1L  CaKoleen DistanceS, RD, LDN Pager #- [phone]#- [phone] Hours Pager: [phone]

## 2019-03-16 NOTE — Consult Note (Signed)
I spoke with Dr Manuella Ghazi regarding results of wet prep specimen this morning. Given that yeast is normally present and does not always indicate an infection, along with patient's unresponsive condition making it impossible to know if she is experiencing any symptoms of a yeast infection, I do not know if treatment is warranted. There are no obvious signs of an infection on exam. I defer clinical judgment to Dr Manuella Ghazi whether benefits of treatment would outweigh any risks given her complicated medical condition.   Limited physical exam: Pelvic: normal external genitalia, thin, clear/white discharge on specimen swab   Results for JADIEN, DEPAUL (MRN [id number]) as of 03/16/2019 15:46  Ref. Range 03/16/2019 10:01  Yeast Wet Prep HPF POC Latest Ref Range: NONE SEEN  PRESENT (A)  Trich, Wet Prep Latest Ref Range: NONE SEEN  NONE SEEN  Clue Cells Wet Prep HPF POC Latest Ref Range: NONE SEEN  NONE SEEN  WBC, Wet Prep HPF POC Latest Ref Range: NONE SEEN  MANY (A)   Rod Can, CNM

## 2019-03-17 LAB — BASIC METABOLIC PANEL
Anion gap: 7 (ref 5–15)
BUN: 33 mg/dL — ABNORMAL HIGH (ref 8–23)
CO2: 29 mmol/L (ref 22–32)
Calcium: 7.9 mg/dL — ABNORMAL LOW (ref 8.9–10.3)
Chloride: 101 mmol/L (ref 98–111)
Creatinine, Ser: 2.3 mg/dL — ABNORMAL HIGH (ref 0.44–1.00)
GFR calc Af Amer: 25 mL/min — ABNORMAL LOW (ref >60)
GFR calc non Af Amer: 22 mL/min — ABNORMAL LOW (ref >60)
Glucose, Bld: 163 mg/dL — ABNORMAL HIGH (ref 70–99)
Potassium: 4 mmol/L (ref 3.5–5.1)
Sodium: 137 mmol/L (ref 135–145)

## 2019-03-17 LAB — CBC
HCT: 22.2 % — ABNORMAL LOW (ref 36.0–46.0)
Hemoglobin: 6.8 g/dL — ABNORMAL LOW (ref 12.0–15.0)
MCH: 31.2 pg (ref 26.0–34.0)
MCHC: 30.6 g/dL (ref 30.0–36.0)
MCV: 101.8 fL — ABNORMAL HIGH (ref 80.0–100.0)
Platelets: 198 10*3/uL (ref 150–400)
RBC: 2.18 MIL/uL — ABNORMAL LOW (ref 3.87–5.11)
RDW: 21.5 % — ABNORMAL HIGH (ref 11.5–15.5)
WBC: 6.4 10*3/uL (ref 4.0–10.5)
nRBC: 0 % (ref 0.0–0.2)

## 2019-03-17 LAB — BLOOD GAS, ARTERIAL
Acid-Base Excess: 5.7 mmol/L — ABNORMAL HIGH (ref 0.0–2.0)
Bicarbonate: 28.6 mmol/L — ABNORMAL HIGH (ref 20.0–28.0)
FIO2: 0.21
O2 Saturation: 98.5 %
Patient temperature: 37
pCO2 arterial: 35 mmHg (ref 32.0–48.0)
pH, Arterial: 7.52 — ABNORMAL HIGH (ref 7.350–7.450)
pO2, Arterial: 103 mmHg (ref 83.0–108.0)

## 2019-03-17 LAB — GLUCOSE, CAPILLARY
Glucose-Capillary: 128 mg/dL — ABNORMAL HIGH (ref 70–99)
Glucose-Capillary: 131 mg/dL — ABNORMAL HIGH (ref 70–99)
Glucose-Capillary: 144 mg/dL — ABNORMAL HIGH (ref 70–99)
Glucose-Capillary: 155 mg/dL — ABNORMAL HIGH (ref 70–99)
Glucose-Capillary: 157 mg/dL — ABNORMAL HIGH (ref 70–99)
Glucose-Capillary: 165 mg/dL — ABNORMAL HIGH (ref 70–99)
Glucose-Capillary: 232 mg/dL — ABNORMAL HIGH (ref 70–99)

## 2019-03-17 LAB — APTT: aPTT: 59 seconds — ABNORMAL HIGH (ref 24–36)

## 2019-03-17 LAB — HEPARIN LEVEL (UNFRACTIONATED): Heparin Unfractionated: 2.88 IU/mL — ABNORMAL HIGH (ref 0.30–0.70)

## 2019-03-17 LAB — PREPARE RBC (CROSSMATCH)

## 2019-03-17 MED ORDER — CLOTRIMAZOLE 1 % VA CREA
1.0000 | TOPICAL_CREAM | Freq: Every day | VAGINAL | Status: AC
  Administered 2019-03-17 – 2019-03-23 (×7): 1 via VAGINAL
  Filled 2019-03-17: qty 45

## 2019-03-17 MED ORDER — HEPARIN (PORCINE) 25000 UT/250ML-% IV SOLN
700.0000 [IU]/h | INTRAVENOUS | Status: AC
  Administered 2019-03-17: 800 [IU]/h via INTRAVENOUS
  Administered 2019-03-19: 05:00:00 700 [IU]/h via INTRAVENOUS
  Filled 2019-03-17 (×2): qty 250

## 2019-03-17 MED ORDER — SODIUM CHLORIDE 0.9% IV SOLUTION: Freq: Once | INTRAVENOUS | Status: AC

## 2019-03-17 MED ORDER — EPOETIN ALFA 10000 UNIT/ML IJ SOLN
10000.0000 [IU] | INTRAMUSCULAR | Status: DC
  Administered 2019-03-20 – 2019-04-20 (×14): 10000 [IU] via INTRAVENOUS
  Filled 2019-03-17 (×3): qty 1

## 2019-03-17 NOTE — Progress Notes (Addendum)
PROGRESS NOTE    Jamie Arias  [id number] DOB: 10-14-1955 DOA: 02/09/2019 PCP: [name], MD   Brief Narrative:  Jamie Arias is a 64 y.o. white female with end-stage renal disease on hemodialysis, atrial fibrillation, chronic diastolic congestive heart failure, coronary artery disease status post CABG, diabetes mellitus type II, hypertension, hyperlipidemia, hypothyroidism, Covid positive in July 2020, recent nursing home stay in [place]. Prolonged hospitalization for MRSA bacteremia/sepsis and now with acute encephalopathy.  Subjective: Opens eye,  Remains encephalopathic. Husband at bedside concerned about her  Assessment & Plan:   Principal Problem:   Sepsis due to methicillin resistant Staphylococcus aureus (MRSA) ([place]) Active Problems:   Type 2 diabetes mellitus with ESRD (end-stage renal disease) ([facility])   Atrial fibrillation, chronic ([facility])   Hypotension   Chronic diastolic CHF (congestive heart failure) ([facility])   ESRD (end stage renal disease) ([facility])   Altered level of consciousness   Decubitus ulcer of heel, bilateral, stage 2 ([facility])   MRSA bacteremia   Arm DVT (deep venous thromboembolism), acute, left ([facility])   Acute encephalopathy   Acute metabolic encephalopathy   Acute blood loss anemia   AF (paroxysmal atrial fibrillation) ([place])   Diarrhea   Oropharyngeal dysphagia   Abscess  Sepsis secondary to MRSA bacteremia.  No obvious source.  Right arm graft was removed on 03/04/2019. -Continue with vancomycin for 6-week, will finish her course on 04/01/2019.  Acute metabolic encephalopathy.  Mental status still impaired.  MRI of brain and EEG negative. Patient remained afebrile with no leukocytosis.  B12 within normal range, folate and B1 results are pending.  She was started on thiamine challenge by neurology but not much improvement. Because of persistent encephalopathy - Neuro recommends LP which is requested for Thursday under IR, she needs to be off  Eliquis for 48 hrs. Will start he ron heparin drip in the interim Repeat TSH was elevated. - Increased dose of Synthroid to 100 mcg -Overall prognosis seems poor.  Bleeding from catheter site with multiple ecchymosis.  No more active bleeding .  Warfarin and heparin was discontinued due to concern for necrosis of skin.  Hemoglobin dropped to 6.8 -Holding Eliquis & switch over to Heparin drip for LP.  Vaginal discharge.  There was some nursing concern of vaginal discharge  -there is some yeast but Gyn feels this may be colonization - may not need treatment. She is not symptomatic  Anemia.  Hemoglobin 6.8 today.  Iron studies consistent with anemia of chronic illness.  MCV elevated. If transfuse 1 PRBC today -B12 within normal range -Continue EPO with dialysis.  Hypotension.  Resolved now  ESRD (M WF).   Currently using tunneled catheter as graft was removed due to MRSA bacteremia. -Continue with scheduled dialysis.  Left arm DVT.  Patient was on Heparin.  Recent diagnosis of left arm DVT on 03/03/2019. -Discontinue heparin and Coumadin due to concern for Coumadin toxicity/side effect due to her skin necrotic lesions. -holding Eliquis for LP and switched over to Heparin drip  Hypothyroidism.  Repeat TSH elevated at 17.82. -Increased the dose of Synthroid to 100. - repeat TSH in 2 to 3 weeks.  Paroxysmal A. Fib.. Currently in sinus. Amiodarone was discontinued. -Holding Eliquis for LP and started heparin drip  Type 2 diabetes. -Continue SSI.  Chronic diastolic heart failure.  Volume being managed by dialysis.  Bilateral decubitus ulcers.  Present on admission. -Continue with wound care.  Nutrition.  PEG placed 03/03/2019.  Continue tube feeding.  Acute hypoxic respiratory failure.  Resolved.  Patient was intubated initially, extubated on 03/05/2019.  Objective: Vitals:   03/17/19 1136 03/17/19 1641 03/17/19 1725 03/17/19 2035  BP: (!) 125/51 104/77 (!) 123/55 131/65    Pulse: 80 79 82 79  Resp: 16 18 18 14   Temp: 98 F (36.7 C) 97.6 F (36.4 C) 97.9 F (36.6 C) 99 F (37.2 C)  TempSrc: Oral Oral Axillary Axillary  SpO2: 100% 100% 97% 100%  Weight:      Height:        Intake/Output Summary (Last 24 hours) at 03/17/2019 2106 Last data filed at 03/17/2019 1738 Gross per 24 hour  Intake 2224 ml  Output 400 ml  Net 1824 ml   Filed Weights   03/10/19 1220 03/16/19 1100 03/16/19 1400  Weight: 87.3 kg 88.7 kg 86.5 kg    Examination:  General exam: Chronically ill-appearing lady, minimal response to painful stimuli, in no acute distress. Respiratory system: Clear to auscultation. Respiratory effort normal. Cardiovascular system: S1 & S2 heard, RRR. No JVD, murmurs, rubs, gallops or clicks. Gastrointestinal system: Soft, nontender, nondistended, bowel sounds positive. Central nervous system: Unable to assess as patient is minimally responsive and not following any commands. Extremities: Bilateral upper extremity edema, no cyanosis, pulses intact and symmetrical. Skin: Decubitus ulcers on both heels.  Multiple ecchymosis on all extremities and upper chest, appears healing now. Psychiatry: Judgement and insight appear impaired.  DVT prophylaxis: Eliquis Code Status: DNR Family Communication: Husband was updated at bedside. Disposition Plan: Waiting for bed offer at [facility]  Consultants:   Nephrology  Infectious disease  Vascular surgery  Neurology  Critical care specialist   Procedures:  Antimicrobials:  Vancomycin.  Data Reviewed: I have personally reviewed following labs and imaging studies  CBC: Recent Labs  Lab 03/13/19 0618 03/14/19 0502 03/15/19 0518 03/16/19 0832 03/17/19 0549  WBC 8.9 9.1 10.4 7.9 6.4  HGB 8.5* 7.4* 7.5* 7.2* 6.8*  HCT 27.4* 24.2* 24.1* 23.4* 22.2*  MCV 98.9 100.0 100.0 101.3* 101.8*  PLT 262 235 263 218 [id number]   Basic Metabolic Panel: Recent Labs  Lab 03/11/19 0502 03/14/19 0502  03/15/19 0509 03/16/19 0832 03/17/19 0549  NA 137 138 138 136 137  K 3.4* 2.8* 3.8 4.6 4.0  CL 102 104 101 101 101  CO2 29 30 26 25 29   GLUCOSE 151* 171* 203* 175* 163*  BUN 33* 21 34* 50* 33*  CREATININE 2.97* 1.90* 2.70* 3.44* 2.30*  CALCIUM 8.4* 8.1* 8.1* 7.9* 7.9*  MG 2.1  --   --   --   --   PHOS  --  2.0* 4.0 4.9*  --    GFR: Estimated Creatinine Clearance: 25.9 mL/min (A) (by C-G formula based on SCr of 2.3 mg/dL (H)). Liver Function Tests: Recent Labs  Lab 03/11/19 0502 03/14/19 0502 03/15/19 0509 03/16/19 0832  AST 17  --   --   --   ALT 7  --   --   --   ALKPHOS 74  --   --   --   BILITOT 0.7  --   --   --   PROT 5.0*  --   --   --   ALBUMIN 1.8* 1.8* 1.8* 1.7*   No results for input(s): LIPASE, AMYLASE in the last 168 hours. No results for input(s): AMMONIA in the last 168 hours. Coagulation Profile: Recent Labs  Lab 03/11/19 0502 03/12/19 0536 03/13/19 0618  INR 1.2 1.2 1.4*   Cardiac Enzymes:  No results for input(s): CKTOTAL, CKMB, CKMBINDEX, TROPONINI in the last 168 hours. BNP (last 3 results) No results for input(s): PROBNP in the last 8760 hours. HbA1C: No results for input(s): HGBA1C in the last 72 hours. CBG: Recent Labs  Lab 03/17/19 0405 03/17/19 0749 03/17/19 1133 03/17/19 1647 03/17/19 2012  GLUCAP 165* 128* 131* 144* 157*   Lipid Profile: No results for input(s): CHOL, HDL, LDLCALC, TRIG, CHOLHDL, LDLDIRECT in the last 72 hours. Thyroid Function Tests: Recent Labs    03/15/19 0518 03/15/19 1443  TSH 17.828*  --   T4TOTAL  --  4.1*   Anemia Panel: Recent Labs    03/15/19 0518  VITAMINB12 437   Sepsis Labs: No results for input(s): PROCALCITON, LATICACIDVEN in the last 168 hours.  Recent Results (from the past 240 hour(s))  Wet prep, genital     Status: Abnormal   Collection Time: 03/16/19 10:01 AM  Result Value Ref Range Status   Yeast Wet Prep HPF POC PRESENT (A) NONE SEEN Final   Trich, Wet Prep NONE SEEN NONE  SEEN Final   Clue Cells Wet Prep HPF POC NONE SEEN NONE SEEN Final   WBC, Wet Prep HPF POC MANY (A) NONE SEEN Final   Sperm NONE SEEN  Final    Comment: Performed at [facility], [address], [place], [place] [address]     Radiology Studies: No results found.  Scheduled Meds: . aspirin  81 mg Per Tube Daily  . B-complex with vitamin C  1 tablet Per Tube Daily  . Chlorhexidine Gluconate Cloth  6 each Topical Q0600  . clotrimazole  1 Applicatorful Vaginal QHS  . [START ON 03/18/2019] epoetin (EPOGEN/PROCRIT) injection  10,000 Units Intravenous Q M,W,F-HD  . feeding supplement (PRO-STAT SUGAR FREE 64)  30 mL Per Tube Daily  . fluticasone  2 spray Each Nare Daily  . free water  20 mL Per Tube Q4H  . insulin aspart  0-15 Units Subcutaneous Q4H  . levothyroxine  100 mcg Per Tube QAC breakfast  . mouth rinse  15 mL Mouth Rinse BID  . pantoprazole (PROTONIX) IV  40 mg Intravenous Q12H  . psyllium  1 packet Per Tube TID  . venlafaxine  75 mg Per Tube BID WC  . zinc sulfate  220 mg Per Tube Daily   Continuous Infusions: . sodium chloride 10 mL (03/15/19 1758)  . feeding supplement (OSMOLITE 1.5 CAL) 1,000 mL (03/17/19 1737)  . heparin 800 Units/hr (03/17/19 2053)  . vancomycin Stopped (03/16/19 1358)     LOS: 36 days   Time spent: 35 min.   Max Sane, MD [facility] Pager [phone]  If 7PM-7AM, please contact night-coverage [email or web address] Password [facility] 03/17/2019, 9:06 PM   This record has been created using Systems analyst. Errors have been sought and corrected,but may not always be located. Such creation errors do not reflect on the standard of care.

## 2019-03-17 NOTE — Consult Note (Signed)
ANTICOAGULATION CONSULT NOTE  Pharmacy Consult for heparin Indication: DVT  Patient Measurements: Height: 5' 4.02" (162.6 cm) Weight: 190 lb 11.2 oz (86.5 kg) IBW/kg (Calculated) : 54.74 Heparin Dosing Weight: 71 kg  Vital Signs: Temp: 98.8 F (37.1 C) (01/12 0410) Temp Source: Oral (01/12 0410) BP: 121/59 (01/12 0410) Pulse Rate: 80 (01/12 0410)  Labs: Recent Labs    03/15/19 0509 03/15/19 0518 03/16/19 0832 03/17/19 0549  HGB  --  7.5* 7.2* 6.8*  HCT  --  24.1* 23.4* 22.2*  PLT  --  263 218 198  CREATININE 2.70*  --  3.44* 2.30*   Estimated Creatinine Clearance: 25.9 mL/min (A) (by C-G formula based on SCr of 2.3 mg/dL (H)).  Medical History: Past Medical History:  Diagnosis Date  . Anemia   . Chronic diastolic CHF (congestive heart failure) ([place])    a. Due to ischemic cardiomyopathy. EF as low as 35%, improved to normal s/p CABG; b. echo 07/06/13: EF 55-60%, no RWMAs, mod TR, trivial pericardial effusion not c/w tamponade physiology;  c. 10/2015 Echo: EF 65%, Gr1 DD, triv AI, mild MR, mildly dil LA, mod TR, PASP 42mmHg.  Marland Kitchen Coronary artery disease    a. NSTEMI 06/2013; b.cath: severe three-vessel CAD w/ EF 30% & mild-mod MR; c. s/p 3 vessel CABG 07/02/13 (LIMA-LAD, SVG-OM, and SVG-RPDA);  d. 10/2015 MV: no ischemia/infarct.  . Diabetes mellitus without complication ([place])   . Diabetic neuropathy ([place])   . Dialysis patient [facility])    MWF  . ESRD (end stage renal disease) ([place])    a. 12/2015 initiated - [facility] dialysis.  Marland Kitchen GERD (gastroesophageal reflux disease)   . Hyperlipidemia   . Hypertension   . Hypothyroidism   . Myocardial infarction ([place]) 2015  . Neuropathy   . Pleural effusion 2015  . Pulmonary hypertension ([place])   . Renal insufficiency   . Wears dentures    full lower    Medications:  Scheduled:  . sodium chloride   Intravenous Once  . apixaban  5 mg Per Tube BID  . aspirin  81 mg Per Tube Daily  . B-complex with vitamin C  1 tablet Per Tube Daily  .  Chlorhexidine Gluconate Cloth  6 each Topical Q0600  . [START ON 03/18/2019] epoetin (EPOGEN/PROCRIT) injection  10,000 Units Intravenous Q M,W,F-HD  . feeding supplement (PRO-STAT SUGAR FREE 64)  30 mL Per Tube Daily  . fluticasone  2 spray Each Nare Daily  . free water  20 mL Per Tube Q4H  . insulin aspart  0-15 Units Subcutaneous Q4H  . levothyroxine  100 mcg Per Tube QAC breakfast  . mouth rinse  15 mL Mouth Rinse BID  . pantoprazole (PROTONIX) IV  40 mg Intravenous Q12H  . psyllium  1 packet Per Tube TID  . venlafaxine  75 mg Per Tube BID WC  . zinc sulfate  220 mg Per Tube Daily    Assessment: 64 y.o. female admitted on 02/09/2019 with MRSA  bacteremia. She was on apixaban PTA but last dose was prior to admission (12/7). [place] 12/15 revealed age-indeterminate short segment occlusive DVT involving the mid humeral aspect of one of the paired brachial veins. On 12/20 a femoral central line was placed. She was on heparin until 03/13/18 when she was changed to apixaban. However, an LP is being ordered in the setting of continued confusion and she is being transitioned to heparin until the procedure is performed. Her last dose of apixaban was 0928 this am. H&H  trending down, requiring intermittent transfusions, although there is no active bleeding noted,  PLT wnl, baseline aPTT and heparin level have been ordered but not yet resulted.  Goal of Therapy:  Heparin Level: 0.3 - 0.7 units/mL aPTT 66 - 102s Monitor platelets by anticoagulation protocol: Yes   Plan:   Based on previous data during this admission we will begin her heparin infusion approximately 12 hours after her last apixaban dose at 800 units/hr (no bolus required)  aPTT will be used to guide dosing until heparin level and aPTT correlate  aPTT and heparin level 8 hours after infusion begins  CBC in am  Vallery Sa, PharmD 03/17/2019 9:18 AM

## 2019-03-17 NOTE — Progress Notes (Signed)
[facility]  ROUNDING NOTE   Subjective:   Hemodialysis treatment yesterday. Tolerated treatment well.  Opening eyes spontaneously and nonsensical speech.    Objective:  Vital signs in last 24 hours:  Temp:  [98 F (36.7 C)-98.8 F (37.1 C)] 98 F (36.7 C) (01/12 1136) Pulse Rate:  [80-92] 80 (01/12 1136) Resp:  [16-20] 16 (01/12 1136) BP: (103-126)/(47-75) 125/51 (01/12 1136) SpO2:  [98 %-100 %] 100 % (01/12 1136) Weight:  [86.5 kg] 86.5 kg (01/11 1400)  Weight change:  Filed Weights   03/10/19 1220 03/16/19 1100 03/16/19 1400  Weight: 87.3 kg 88.7 kg 86.5 kg    Intake/Output: I/O last 3 completed shifts: In: 1980.9 [NG/GT:1830.8; IV Piggyback:150] Out: 2500 [Other:2000; Stool:500]   Intake/Output this shift:  No intake/output data recorded.  Physical Exam: General: NAD, laying in bed  HEENT [place]/AT   Lungs:  Scatterd rhonchi, normal effort  Heart: Regular rate and rhythm  Abdomen:  Soft, nontender, PEG tube in place  Extremities: 2+ upper and lower extremity edema  Neurologic: Lethargic but arousable      Access: Left IJ PC      Basic Metabolic Panel: Recent Labs  Lab 03/11/19 0502 03/14/19 0502 03/15/19 0509 03/16/19 0832 03/17/19 0549  NA 137 138 138 136 137  K 3.4* 2.8* 3.8 4.6 4.0  CL 102 104 101 101 101  CO2 29 30 26 25 29   GLUCOSE 151* 171* 203* 175* 163*  BUN 33* 21 34* 50* 33*  CREATININE 2.97* 1.90* 2.70* 3.44* 2.30*  CALCIUM 8.4* 8.1* 8.1* 7.9* 7.9*  MG 2.1  --   --   --   --   PHOS  --  2.0* 4.0 4.9*  --     Liver Function Tests: Recent Labs  Lab 03/11/19 0502 03/14/19 0502 03/15/19 0509 03/16/19 0832  AST 17  --   --   --   ALT 7  --   --   --   ALKPHOS 74  --   --   --   BILITOT 0.7  --   --   --   PROT 5.0*  --   --   --   ALBUMIN 1.8* 1.8* 1.8* 1.7*   No results for input(s): LIPASE, AMYLASE in the last 168 hours. No results for input(s): AMMONIA in the last 168 hours.  CBC: Recent Labs  Lab  03/13/19 0618 03/14/19 0502 03/15/19 0518 03/16/19 0832 03/17/19 0549  WBC 8.9 9.1 10.4 7.9 6.4  HGB 8.5* 7.4* 7.5* 7.2* 6.8*  HCT 27.4* 24.2* 24.1* 23.4* 22.2*  MCV 98.9 100.0 100.0 101.3* 101.8*  PLT 262 235 263 218 198    Cardiac Enzymes: No results for input(s): CKTOTAL, CKMB, CKMBINDEX, TROPONINI in the last 168 hours.  BNP: Invalid input(s): POCBNP  CBG: Recent Labs  Lab 03/16/19 1949 03/17/19 0052 03/17/19 0405 03/17/19 0749 03/17/19 1133  GLUCAP 122* 232* 165* 128* 131*    Microbiology: Results for orders placed or performed during the hospital encounter of 02/09/19  Culture, blood (routine x 2)     Status: Abnormal   Collection Time: 02/09/19  2:29 PM   Specimen: BLOOD LEFT ARM  Result Value Ref Range Status   Specimen Description   Final    BLOOD LEFT ARM Performed at [facility], [address], [place], [place] [address]    Special Requests   Final    BOTTLES DRAWN AEROBIC AND ANAEROBIC Blood Culture adequate volume Performed at [place]  [address] [place], [place] [address]    Culture  Setup Time   Final    GRAM POSITIVE COCCI IN BOTH AEROBIC AND ANAEROBIC BOTTLES CRITICAL VALUE NOTED.  VALUE IS CONSISTENT WITH PREVIOUSLY REPORTED AND CALLED VALUE. Performed at [facility], [address] [place], [place] [address]    Culture (A)  Final    STAPHYLOCOCCUS AUREUS SUSCEPTIBILITIES PERFORMED ON PREVIOUS CULTURE WITHIN THE LAST 5 DAYS. Performed at [facility], [place] [address], [place], [place] [address]    Report Status 02/12/2019 FINAL  Final  Culture, blood (routine x 2)     Status: Abnormal   Collection Time: 02/09/19  2:38 PM   Specimen: BLOOD LEFT ARM  Result Value Ref Range Status   Specimen Description   Final    BLOOD LEFT ARM Performed at [facility], [address] [place], [place] [address]    Special Requests   Final    BOTTLES DRAWN AEROBIC AND ANAEROBIC Blood Culture  adequate volume Performed at [facility], [address], [place], [place] [address]    Culture  Setup Time   Final    GRAM POSITIVE COCCI IN BOTH AEROBIC AND ANAEROBIC BOTTLES CRITICAL RESULT CALLED TO, READ BACK BY AND VERIFIED WITH: [place] [id number] 02/10/2019 HNM Performed at [facility], [place] [address], [place], [place] [address]    Culture METHICILLIN RESISTANT STAPHYLOCOCCUS AUREUS (A)  Final   Report Status 02/12/2019 FINAL  Final   Organism ID, Bacteria METHICILLIN RESISTANT STAPHYLOCOCCUS AUREUS  Final      Susceptibility   Methicillin resistant staphylococcus aureus - MIC*    CIPROFLOXACIN >=8 RESISTANT Resistant     ERYTHROMYCIN >=8 RESISTANT Resistant     GENTAMICIN <=0.5 SENSITIVE Sensitive     OXACILLIN >=4 RESISTANT Resistant     TETRACYCLINE <=1 SENSITIVE Sensitive     VANCOMYCIN 1 SENSITIVE Sensitive     TRIMETH/SULFA <=10 SENSITIVE Sensitive     CLINDAMYCIN RESISTANT Resistant     RIFAMPIN <=0.5 SENSITIVE Sensitive     Inducible Clindamycin POSITIVE Resistant     * METHICILLIN RESISTANT STAPHYLOCOCCUS AUREUS  Blood Culture ID Panel (Reflexed)     Status: Abnormal   Collection Time: 02/09/19  2:38 PM  Result Value Ref Range Status   Enterococcus species NOT DETECTED NOT DETECTED Final   Listeria monocytogenes NOT DETECTED NOT DETECTED Final   Staphylococcus species DETECTED (A) NOT DETECTED Final    Comment: CRITICAL RESULT CALLED TO, READ BACK BY AND VERIFIED WITH: Hart Robinsons PHARMD [id number] 02/10/2019 HNM    Staphylococcus aureus (BCID) DETECTED (A) NOT DETECTED Final    Comment: Methicillin (oxacillin)-resistant Staphylococcus aureus (MRSA). MRSA is predictably resistant to beta-lactam antibiotics (except ceftaroline). Preferred therapy is vancomycin unless clinically contraindicated. Patient requires contact precautions if  hospitalized. CRITICAL RESULT CALLED TO, READ BACK BY AND VERIFIED WITH: Hart Robinsons PHARMD [id number] 02/10/2019 HNM     Methicillin resistance DETECTED (A) NOT DETECTED Final    Comment: CRITICAL RESULT CALLED TO, READ BACK BY AND VERIFIED WITH: Hart Robinsons PHARMD [id number] 02/10/2019 HNM    Streptococcus species NOT DETECTED NOT DETECTED Final   Streptococcus agalactiae NOT DETECTED NOT DETECTED Final   Streptococcus pneumoniae NOT DETECTED NOT DETECTED Final   Streptococcus pyogenes NOT DETECTED NOT DETECTED Final   Acinetobacter baumannii NOT DETECTED NOT DETECTED Final   Enterobacteriaceae species NOT DETECTED NOT DETECTED Final   Enterobacter cloacae complex NOT DETECTED NOT DETECTED Final   Escherichia  coli NOT DETECTED NOT DETECTED Final   Klebsiella oxytoca NOT DETECTED NOT DETECTED Final   Klebsiella pneumoniae NOT DETECTED NOT DETECTED Final   Proteus species NOT DETECTED NOT DETECTED Final   Serratia marcescens NOT DETECTED NOT DETECTED Final   Haemophilus influenzae NOT DETECTED NOT DETECTED Final   Neisseria meningitidis NOT DETECTED NOT DETECTED Final   Pseudomonas aeruginosa NOT DETECTED NOT DETECTED Final   Candida albicans NOT DETECTED NOT DETECTED Final   Candida glabrata NOT DETECTED NOT DETECTED Final   Candida krusei NOT DETECTED NOT DETECTED Final   Candida parapsilosis NOT DETECTED NOT DETECTED Final   Candida tropicalis NOT DETECTED NOT DETECTED Final    Comment: Performed at [facility], [place], [place] [address]  SARS CORONAVIRUS 2 (TAT 6-24 HRS) Nasopharyngeal Nasopharyngeal Swab     Status: None   Collection Time: 02/09/19  2:54 PM   Specimen: Nasopharyngeal Swab  Result Value Ref Range Status   SARS Coronavirus 2 NEGATIVE NEGATIVE Final    Comment: (NOTE) SARS-CoV-2 target nucleic acids are NOT DETECTED. The SARS-CoV-2 RNA is generally detectable in upper and lower respiratory specimens during the acute phase of infection. Negative results do not preclude SARS-CoV-2 infection, do not rule out co-infections with other pathogens, and should not be used  as the sole basis for treatment or other patient management decisions. Negative results must be combined with clinical observations, patient history, and epidemiological information. The expected result is Negative. Fact Sheet for Patients: SugarRoll.be Fact Sheet for Healthcare Providers: https://www.woods-mathews.com/ This test is not yet approved or cleared by the [place] FDA and  has been authorized for detection and/or diagnosis of SARS-CoV-2 by FDA under an Emergency Use Authorization (EUA). This EUA will remain  in effect (meaning this test can be used) for the duration of the COVID-19 declaration under Section 56 4(b)(1) of the Act, 21 U.S.C. section 360bbb-3(b)(1), unless the authorization is terminated or revoked sooner. Performed at [facility], [place] [address], [place], [place] [address]   MRSA PCR Screening     Status: Abnormal   Collection Time: 02/10/19  5:03 PM   Specimen: Nasopharyngeal  Result Value Ref Range Status   MRSA by PCR POSITIVE (A) NEGATIVE Final    Comment:        The GeneXpert MRSA Assay (FDA approved for NASAL specimens only), is one component of a comprehensive MRSA colonization surveillance program. It is not intended to diagnose MRSA infection nor to guide or monitor treatment for MRSA infections. RESULT CALLED TO, READ BACK BY AND VERIFIED WITH: Britt Boozer @1839  02/10/19 MJU Performed at [facility], [address] [place], [place] [address]   Culture, blood (routine x 2)     Status: Abnormal   Collection Time: 02/11/19 12:31 PM   Specimen: BLOOD  Result Value Ref Range Status   Specimen Description   Final    BLOOD PORTA CATH Performed at [place] [address], [place], [place] [address]    Special Requests   Final    BOTTLES DRAWN AEROBIC AND ANAEROBIC Blood Culture adequate volume Performed at [facility], [address] [place],  [place] [address]    Culture  Setup Time   Final    GRAM POSITIVE COCCI ANAEROBIC BOTTLE ONLY CRITICAL RESULT CALLED TO, READ BACK BY AND VERIFIED WITH: SCOTT HALL AT [id number] 02/12/2019 [facility]  Performed at [facility], [address] [place], [place] [address]  Culture (A)  Final    STAPHYLOCOCCUS AUREUS SUSCEPTIBILITIES PERFORMED ON PREVIOUS CULTURE WITHIN THE LAST 5 DAYS. Performed at [facility], [place] [address], [place], [place] [address]    Report Status 02/13/2019 FINAL  Final  Culture, blood (routine x 2)     Status: None   Collection Time: 02/11/19  3:57 PM   Specimen: BLOOD  Result Value Ref Range Status   Specimen Description BLOOD PORTA CATH  Final   Special Requests   Final    BOTTLES DRAWN AEROBIC AND ANAEROBIC Blood Culture adequate volume   Culture   Final    NO GROWTH 5 DAYS Performed at [facility], [address] [place], [place] [address]    Report Status 02/16/2019 FINAL  Final  CULTURE, BLOOD (ROUTINE X 2) w Reflex to ID Panel     Status: Abnormal   Collection Time: 02/13/19  2:18 PM   Specimen: BLOOD LEFT HAND  Result Value Ref Range Status   Specimen Description BLOOD LEFT HAND  Final   Special Requests   Final    BOTTLES DRAWN AEROBIC AND ANAEROBIC Blood Culture results may not be optimal due to an inadequate volume of blood received in culture bottles   Culture  Setup Time   Final    AEROBIC BOTTLE ONLY GRAM POSITIVE COCCI IN CLUSTERS CRITICAL RESULT CALLED TO, READ BACK BY AND VERIFIED WITH: Herbert Pun AT [id number] ON 02/14/2019 [place]. Performed at [facility], [place] [address], [place], [place] [address]    Culture METHICILLIN RESISTANT STAPHYLOCOCCUS AUREUS (A)  Final   Report Status 02/16/2019 FINAL  Final   Organism ID, Bacteria METHICILLIN RESISTANT STAPHYLOCOCCUS AUREUS  Final      Susceptibility   Methicillin resistant staphylococcus aureus - MIC*    CIPROFLOXACIN >=8 RESISTANT Resistant     ERYTHROMYCIN >=8 RESISTANT  Resistant     GENTAMICIN <=0.5 SENSITIVE Sensitive     OXACILLIN >=4 RESISTANT Resistant     TETRACYCLINE <=1 SENSITIVE Sensitive     VANCOMYCIN <=0.5 SENSITIVE Sensitive     TRIMETH/SULFA <=10 SENSITIVE Sensitive     CLINDAMYCIN RESISTANT Resistant     RIFAMPIN <=0.5 SENSITIVE Sensitive     Inducible Clindamycin POSITIVE Resistant     * METHICILLIN RESISTANT STAPHYLOCOCCUS AUREUS  Blood Culture ID Panel (Reflexed)     Status: Abnormal   Collection Time: 02/13/19  2:18 PM  Result Value Ref Range Status   Enterococcus species NOT DETECTED NOT DETECTED Final   Listeria monocytogenes NOT DETECTED NOT DETECTED Final   Staphylococcus species DETECTED (A) NOT DETECTED Final    Comment: CRITICAL RESULT CALLED TO, READ BACK BY AND VERIFIED WITH: ABBEY ELLINGTON AT [id number] ON 02/14/2019 [place].    Staphylococcus aureus (BCID) DETECTED (A) NOT DETECTED Final    Comment: Methicillin (oxacillin)-resistant Staphylococcus aureus (MRSA). MRSA is predictably resistant to beta-lactam antibiotics (except ceftaroline). Preferred therapy is vancomycin unless clinically contraindicated. Patient requires contact precautions if  hospitalized. CRITICAL RESULT CALLED TO, READ BACK BY AND VERIFIED WITH: Herbert Pun AT [id number] ON 02/14/2019 [place].    Methicillin resistance DETECTED (A) NOT DETECTED Final    Comment: CRITICAL RESULT CALLED TO, READ BACK BY AND VERIFIED WITH: Herbert Pun AT [id number] ON 02/14/2019 [place].    Streptococcus species NOT DETECTED NOT DETECTED Final   Streptococcus agalactiae NOT DETECTED NOT DETECTED Final   Streptococcus pneumoniae NOT DETECTED NOT DETECTED Final   Streptococcus pyogenes NOT DETECTED NOT DETECTED Final   Acinetobacter baumannii  NOT DETECTED NOT DETECTED Final   Enterobacteriaceae species NOT DETECTED NOT DETECTED Final   Enterobacter cloacae complex NOT DETECTED NOT DETECTED Final   Escherichia coli NOT DETECTED NOT DETECTED Final   Klebsiella oxytoca NOT DETECTED NOT  DETECTED Final   Klebsiella pneumoniae NOT DETECTED NOT DETECTED Final   Proteus species NOT DETECTED NOT DETECTED Final   Serratia marcescens NOT DETECTED NOT DETECTED Final   Haemophilus influenzae NOT DETECTED NOT DETECTED Final   Neisseria meningitidis NOT DETECTED NOT DETECTED Final   Pseudomonas aeruginosa NOT DETECTED NOT DETECTED Final   Candida albicans NOT DETECTED NOT DETECTED Final   Candida glabrata NOT DETECTED NOT DETECTED Final   Candida krusei NOT DETECTED NOT DETECTED Final   Candida parapsilosis NOT DETECTED NOT DETECTED Final   Candida tropicalis NOT DETECTED NOT DETECTED Final    Comment: Performed at [facility], [address] [place], [place] [address]  CULTURE, BLOOD (ROUTINE X 2) w Reflex to ID Panel     Status: None   Collection Time: 02/13/19  3:09 PM   Specimen: BLOOD  Result Value Ref Range Status   Specimen Description BLOOD BLOOD LEFT HAND  Final   Special Requests   Final    BOTTLES DRAWN AEROBIC AND ANAEROBIC Blood Culture adequate volume   Culture   Final    NO GROWTH 5 DAYS Performed at [facility], [address] [place], [place] [address]    Report Status 02/18/2019 FINAL  Final  Culture, blood (routine x 2)     Status: Abnormal   Collection Time: 02/15/19 12:49 PM   Specimen: BLOOD  Result Value Ref Range Status   Specimen Description   Final    BLOOD LT HAND Performed at [facility], [address] [place], [place] [address]    Special Requests   Final    BOTTLES DRAWN AEROBIC AND ANAEROBIC Blood Culture results may not be optimal due to an inadequate volume of blood received in culture bottles Performed at [facility], [address] [place], [place] [address]    Culture  Setup Time   Final    GRAM POSITIVE COCCI IN BOTH AEROBIC AND ANAEROBIC BOTTLES CRITICAL RESULT CALLED TO, READ BACK BY AND VERIFIED WITHEleonore Chiquito AT [id number] 02/16/2019 SDR Performed at [facility], [address] [place], [place] [address]    Culture (A)  Final    METHICILLIN RESISTANT STAPHYLOCOCCUS AUREUS STAPHYLOCOCCUS SPECIES (COAGULASE NEGATIVE) THE SIGNIFICANCE OF ISOLATING THIS ORGANISM FROM A SINGLE SET OF BLOOD CULTURES WHEN MULTIPLE SETS ARE DRAWN IS UNCERTAIN. PLEASE NOTIFY THE MICROBIOLOGY DEPARTMENT WITHIN ONE WEEK IF SPECIATION AND SENSITIVITIES ARE REQUIRED. Performed at [facility], [place] [address] [place], [place] [address]    Report Status 02/18/2019 FINAL  Final   Organism ID, Bacteria METHICILLIN RESISTANT STAPHYLOCOCCUS AUREUS  Final      Susceptibility   Methicillin resistant staphylococcus aureus - MIC*    CIPROFLOXACIN >=8 RESISTANT Resistant     ERYTHROMYCIN >=8 RESISTANT Resistant     GENTAMICIN <=0.5 SENSITIVE Sensitive     OXACILLIN >=4 RESISTANT Resistant     TETRACYCLINE <=1 SENSITIVE Sensitive     VANCOMYCIN <=0.5 SENSITIVE Sensitive     TRIMETH/SULFA <=10 SENSITIVE Sensitive     CLINDAMYCIN RESISTANT Resistant     RIFAMPIN <=0.5 SENSITIVE Sensitive     Inducible Clindamycin POSITIVE Resistant     * METHICILLIN RESISTANT STAPHYLOCOCCUS AUREUS  Aerobic Culture (superficial specimen)  Status: None   Collection Time: 02/17/19  1:31 PM   Specimen: Heel; Wound  Result Value Ref Range Status   Specimen Description   Final    HEEL Performed at [facility], [address] [place], [place] [address]    Special Requests   Final    NONE Performed at [facility], [address] [place], [place] [address]    Gram Stain   Final    NO WBC SEEN MODERATE GRAM POSITIVE COCCI IN PAIRS    Culture   Final    ABUNDANT METHICILLIN RESISTANT STAPHYLOCOCCUS AUREUS ABUNDANT DIPHTHEROIDS(CORYNEBACTERIUM SPECIES) Standardized susceptibility testing for this organism is not available. Performed at [facility], [place] [address] [place], [place] [address]    Report Status 02/19/2019 FINAL  Final   Organism ID, Bacteria  METHICILLIN RESISTANT STAPHYLOCOCCUS AUREUS  Final      Susceptibility   Methicillin resistant staphylococcus aureus - MIC*    CIPROFLOXACIN >=8 RESISTANT Resistant     ERYTHROMYCIN >=8 RESISTANT Resistant     GENTAMICIN <=0.5 SENSITIVE Sensitive     OXACILLIN >=4 RESISTANT Resistant     TETRACYCLINE <=1 SENSITIVE Sensitive     VANCOMYCIN 1 SENSITIVE Sensitive     TRIMETH/SULFA <=10 SENSITIVE Sensitive     CLINDAMYCIN RESISTANT Resistant     RIFAMPIN <=0.5 SENSITIVE Sensitive     Inducible Clindamycin POSITIVE Resistant     * ABUNDANT METHICILLIN RESISTANT STAPHYLOCOCCUS AUREUS  CULTURE, BLOOD (ROUTINE X 2) w Reflex to ID Panel     Status: None   Collection Time: 02/18/19 11:29 PM   Specimen: BLOOD  Result Value Ref Range Status   Specimen Description BLOOD LEFT ARM  Final   Special Requests   Final    BOTTLES DRAWN AEROBIC AND ANAEROBIC Blood Culture adequate volume   Culture   Final    NO GROWTH 5 DAYS Performed at [facility], [address] [place], [place] [address]    Report Status 02/23/2019 FINAL  Final  CULTURE, BLOOD (ROUTINE X 2) w Reflex to ID Panel     Status: None   Collection Time: 02/18/19 11:29 PM   Specimen: BLOOD  Result Value Ref Range Status   Specimen Description BLOOD LEFT ARM  Final   Special Requests   Final    BOTTLES DRAWN AEROBIC AND ANAEROBIC Blood Culture adequate volume   Culture   Final    NO GROWTH 5 DAYS Performed at [facility], [address] [place], [place] [address]    Report Status 02/23/2019 FINAL  Final  Aerobic/Anaerobic Culture (surgical/deep wound)     Status: None   Collection Time: 03/04/19 11:04 AM   Specimen: PATH Other; Tissue  Result Value Ref Range Status   Specimen Description   Final    ARTERY Performed at [facility], [address] [place], [place] [address]    Special Requests   Final    RIGHT AV GRAFT AND STENTS CULTURE Performed at [facility], [address] [place], [place] [address]    Gram Stain   Final    RARE WBC PRESENT, PREDOMINANTLY PMN NO ORGANISMS SEEN    Culture   Final    No growth aerobically or anaerobically. Performed at [facility], [place] [address], [place], [place] [address]    Report Status 03/09/2019 FINAL  Final  Aerobic/Anaerobic Culture (surgical/deep wound)     Status: None   Collection Time: 03/04/19 11:29 AM  Specimen: PATH Other; Tissue  Result Value Ref Range Status   Specimen Description   Final    ARTERY Performed at [facility], [address] [place], [place] [address]    Special Requests   Final    FLUID AROUND AV GRAFT Performed at [facility], [address] [place], [place] [address]    Gram Stain   Final    FEW WBC PRESENT, PREDOMINANTLY PMN NO ORGANISMS SEEN    Culture   Final    No growth aerobically or anaerobically. Performed at [facility], [place] [address], [place], [place] [address]    Report Status 03/09/2019 FINAL  Final  Wet prep, genital     Status: Abnormal   Collection Time: 03/16/19 10:01 AM  Result Value Ref Range Status   Yeast Wet Prep HPF POC PRESENT (A) NONE SEEN Final   Trich, Wet Prep NONE SEEN NONE SEEN Final   Clue Cells Wet Prep HPF POC NONE SEEN NONE SEEN Final   WBC, Wet Prep HPF POC MANY (A) NONE SEEN Final   Sperm NONE SEEN  Final    Comment: Performed at [facility], [address] [place], [place] [address]    Coagulation Studies: No results for input(s): LABPROT, INR in the last 72 hours.  Urinalysis: No results for input(s): COLORURINE, LABSPEC, PHURINE, GLUCOSEU, HGBUR, BILIRUBINUR, KETONESUR, PROTEINUR, UROBILINOGEN, NITRITE, LEUKOCYTESUR in the last 72 hours.  Invalid input(s): APPERANCEUR    Imaging: No results found.   Medications:   . sodium chloride 10 mL (03/15/19 1758)  . feeding supplement (OSMOLITE 1.5 CAL) 1,000 mL (03/16/19 1822)  . vancomycin Stopped (03/16/19 1358)   . sodium  chloride   Intravenous Once  . aspirin  81 mg Per Tube Daily  . B-complex with vitamin C  1 tablet Per Tube Daily  . Chlorhexidine Gluconate Cloth  6 each Topical Q0600  . [START ON 03/18/2019] epoetin (EPOGEN/PROCRIT) injection  10,000 Units Intravenous Q M,W,F-HD  . feeding supplement (PRO-STAT SUGAR FREE 64)  30 mL Per Tube Daily  . fluticasone  2 spray Each Nare Daily  . free water  20 mL Per Tube Q4H  . insulin aspart  0-15 Units Subcutaneous Q4H  . levothyroxine  100 mcg Per Tube QAC breakfast  . mouth rinse  15 mL Mouth Rinse BID  . pantoprazole (PROTONIX) IV  40 mg Intravenous Q12H  . psyllium  1 packet Per Tube TID  . venlafaxine  75 mg Per Tube BID WC  . zinc sulfate  220 mg Per Tube Daily   sodium chloride, acetaminophen, ipratropium-albuterol, ondansetron (ZOFRAN) IV, traMADol  Assessment/ Plan:  Jamie Arias is a 64 y.o. white female with end-stage renal disease on hemodialysis, atrial fibrillation, chronic diastolic congestive heart failure, coronary artery disease status post CABG, diabetes mellitus type II, hypertension, hyperlipidemia, hypothyroidism, Covid positive in July 2020, recent nursing home stay in [place]  Prolonged hospitalization for MRSA bacteremia/sepsis and now with acute encephalopathy  [facility] MWF [facility] [address] Left AVG 81.5kg  1. End Stage Renal Disease: on hemodialysis. With complication of dialysis device. Right arm AVG removed 12/30 due to suspected source of bacteremia Now with left IJ permcath - Continue MWF schedule   2. Hypotension -Continue midodrine 10 mg 3 times daily.  3. Anemia of chronic kidney disease:  EPO with HD treatments.   4. Secondary Hyperparathyroidism: with hypophosphatemia - IV replacement of phosphorus as needed  5. Acute Encephalopathy: No significant  change in mental status.   - Appreciate Neurology input. Showed some improvement between both EEGs - Thiamine   LOS: 36 Jamie Arias 1/12/202112:20  PM

## 2019-03-17 NOTE — Progress Notes (Signed)
Brief summary: This is a complicated patient who was initially admitted on 12/7 with altered level of consciousness.  This was initially attributed to metabolic factors including missing dialysis.  She was found to have MRSA bacteremia on 12/8 started on IV vancomycin and Zosyn.  On 12/10 she remains lethargic, did not talk much but would say her name.  The next few days, was noted that she was gradually improving and there is note of "dystonic" jaw movements.  EEG was performed 12/9 with triphasic waves without epileptiform appearance.   She was given Ceftaroline and daptomycin and had positive blood cultures through 12/15 with a negative blood culture on 12/16.  She has not been febrile recently, but has had low-grade temperature elevations in the mid 99's.   Neurology was consulted on 12/17 and given the length of time the patient had already been hospitalized and aggressive treatment that she had received, LP was not considered to be likely to provide any additional benefit.  Palliative care also began talking with family on 12/17.  TEE was considered, but due to her mental status she was not considered a candidate.  Over the next few days, her mental status continued to worsen the patient was not eating.  On 12/21, due to having a wound on her graft, she had dialysis catheter placed in left IJ.  She has been anemic with a hemoglobin of 6.8 on 12/22 after discontinuation of ceftaroline, on 12/23, it was noted there was improvement of her mental status and there was question about whether this could have been cephalosporin related encephalopathy.  But then she worsened again.  She had a PEG placed on 12/29. She was intubated for the procedure and was not extubated after the procedure.  She had her graft explanted on 12/30, as there was concern for source control.  She was extubated on 12/31.  She apparently was speaking with the nurse after extubation.  On 1/1 it was noted that she was more responsive  than she had been the entire hospitalization, but then again on 1/3 as noted she was not answering questions or following commands.  Neurology reconsulted on 1/5, at that time she did not follow commands or answer questions. Reevaluated by neurology on 01/09 - "For the past year he has had to do all of the cooking, cleaning and handling of finances.  He has noticed some memory issues as well." At that point, she was started on thiamine.   She was admitted in June for [place].   Exam: Vitals:   03/16/19 1952 03/17/19 0410  BP: (!) 115/51 (!) 121/59  Pulse: 86 80  Resp:  20  Temp: 98.7 F (37.1 C) 98.8 F (37.1 C)  SpO2: 98% 100%   Gen: In bed, NAD Resp: non-labored breathing, no acute distress Abd: soft, nt  Neuro: MS: Opens eyes to voice, she does not follow commands.  With vigorous noxious stimulation, when I ask her "does this hurt" she does respond "yes" but does not tell me her name or respond to any other questions. CN: She blinks to threat bilaterally, pupils are reactive bilaterally, face is symmetric Motor: She moves all extremities to noxious stimulation Sensory: As above  Pertinent Labs: TSH on admission(!2/07) 0.189 TSH 03/1015.82 with a free T4 4.1 B12 437 BUN 33, creatinine 2.3 Ammonia has been checked repeatedly throughout the hospitalization and has always been normal Cortisol 1/1 @ 11:12  18.4    Impression: 64 year old female with persistent encephalopathy of unclear origin.  Though  it is still possible that this simply represents severe hospital-acquired delirium in the setting of recent infection, the persistence for this long is unusual.  I do think that a lumbar puncture could add additional information, ruling out indolent infection.  Also, if she has an elevated protein, could consider autoimmune causes though I think that this is less likely.  She is on Eliquis and therefore we will need to have this paused for at least 48 hours prior to attempting lumbar  puncture.  If this represented thiamine deficiency, I would expect some response to the high-dose thiamine for now.  One other consideration given that her best day this hospitalization was right after extubation would be hypercarbia, but the duration seems too long for this.   Recommendations: 1) LP for cells, glucose, protein, fungal culture, regular culture, Oligoclonal bands/IgG index 2) abg  3) neurology will follow once LP is done   Roland Rack, MD [facility] [phone]  If 7pm- 7am, please page neurology on call as listed in [place].

## 2019-03-18 DIAGNOSIS — D62 Acute posthemorrhagic anemia: Secondary | ICD-10-CM

## 2019-03-18 LAB — TYPE AND SCREEN
ABO/RH(D): A POS
Antibody Screen: NEGATIVE
Unit division: 0

## 2019-03-18 LAB — GLUCOSE, CAPILLARY
Glucose-Capillary: 171 mg/dL — ABNORMAL HIGH (ref 70–99)
Glucose-Capillary: 149 mg/dL — ABNORMAL HIGH (ref 70–99)
Glucose-Capillary: 251 mg/dL — ABNORMAL HIGH (ref 70–99)
Glucose-Capillary: 142 mg/dL — ABNORMAL HIGH (ref 70–99)

## 2019-03-18 LAB — BASIC METABOLIC PANEL
Anion gap: 9 (ref 5–15)
BUN: 43 mg/dL — ABNORMAL HIGH (ref 8–23)
CO2: 28 mmol/L (ref 22–32)
Calcium: 8.1 mg/dL — ABNORMAL LOW (ref 8.9–10.3)
Chloride: 100 mmol/L (ref 98–111)
Creatinine, Ser: 2.99 mg/dL — ABNORMAL HIGH (ref 0.44–1.00)
GFR calc Af Amer: 18 mL/min — ABNORMAL LOW (ref >60)
GFR calc non Af Amer: 16 mL/min — ABNORMAL LOW (ref >60)
Glucose, Bld: 170 mg/dL — ABNORMAL HIGH (ref 70–99)
Potassium: 4.2 mmol/L (ref 3.5–5.1)
Sodium: 137 mmol/L (ref 135–145)

## 2019-03-18 LAB — RENAL FUNCTION PANEL
Albumin: 1.8 g/dL — ABNORMAL LOW (ref 3.5–5.0)
Anion gap: 9 (ref 5–15)
BUN: 24 mg/dL — ABNORMAL HIGH (ref 8–23)
CO2: 29 mmol/L (ref 22–32)
Calcium: 7.9 mg/dL — ABNORMAL LOW (ref 8.9–10.3)
Chloride: 98 mmol/L (ref 98–111)
Creatinine, Ser: 1.75 mg/dL — ABNORMAL HIGH (ref 0.44–1.00)
GFR calc Af Amer: 35 mL/min — ABNORMAL LOW (ref >60)
GFR calc non Af Amer: 30 mL/min — ABNORMAL LOW (ref >60)
Glucose, Bld: 156 mg/dL — ABNORMAL HIGH (ref 70–99)
Phosphorus: 2.4 mg/dL — ABNORMAL LOW (ref 2.5–4.6)
Potassium: 3.2 mmol/L — ABNORMAL LOW (ref 3.5–5.1)
Sodium: 136 mmol/L (ref 135–145)

## 2019-03-18 LAB — BPAM RBC
Blood Product Expiration Date: 202101292359
ISSUE DATE / TIME: 202101121659
Unit Type and Rh: 6200

## 2019-03-18 LAB — MAGNESIUM: Magnesium: 1.7 mg/dL (ref 1.7–2.4)

## 2019-03-18 LAB — CBC
HCT: 25.3 % — ABNORMAL LOW (ref 36.0–46.0)
HCT: 26.9 % — ABNORMAL LOW (ref 36.0–46.0)
Hemoglobin: 8.2 g/dL — ABNORMAL LOW (ref 12.0–15.0)
Hemoglobin: 8.7 g/dL — ABNORMAL LOW (ref 12.0–15.0)
MCH: 31.1 pg (ref 26.0–34.0)
MCH: 30.9 pg (ref 26.0–34.0)
MCHC: 32.4 g/dL (ref 30.0–36.0)
MCHC: 32.3 g/dL (ref 30.0–36.0)
MCV: 95.8 fL (ref 80.0–100.0)
MCV: 95.4 fL (ref 80.0–100.0)
Platelets: 220 10*3/uL (ref 150–400)
Platelets: 203 10*3/uL (ref 150–400)
RBC: 2.64 MIL/uL — ABNORMAL LOW (ref 3.87–5.11)
RBC: 2.82 MIL/uL — ABNORMAL LOW (ref 3.87–5.11)
RDW: 21.2 % — ABNORMAL HIGH (ref 11.5–15.5)
RDW: 21.3 % — ABNORMAL HIGH (ref 11.5–15.5)
WBC: 5.7 10*3/uL (ref 4.0–10.5)
WBC: 5.6 10*3/uL (ref 4.0–10.5)
nRBC: 0 % (ref 0.0–0.2)
nRBC: 0 % (ref 0.0–0.2)

## 2019-03-18 LAB — APTT
aPTT: 126 seconds — ABNORMAL HIGH (ref 24–36)
aPTT: 41 seconds — ABNORMAL HIGH (ref 24–36)

## 2019-03-18 LAB — FOLATE RBC
Folate, Hemolysate: 488 ng/mL
Folate, RBC: 5083 ng/mL (ref >498)
Hematocrit: 9.6 % — CL (ref 34.0–46.6)

## 2019-03-18 LAB — HEPATITIS B SURFACE ANTIGEN: Hepatitis B Surface Ag: NONREACTIVE

## 2019-03-18 LAB — HEPARIN LEVEL (UNFRACTIONATED): Heparin Unfractionated: 2.16 IU/mL — ABNORMAL HIGH (ref 0.30–0.70)

## 2019-03-18 LAB — VANCOMYCIN, RANDOM: Vancomycin Rm: 20

## 2019-03-18 MED ORDER — POTASSIUM CHLORIDE CRYS ER 20 MEQ PO TBCR
40.0000 meq | EXTENDED_RELEASE_TABLET | Freq: Two times a day (BID) | ORAL | Status: AC
  Administered 2019-03-18 (×2): 40 meq via ORAL
  Filled 2019-03-18 (×2): qty 2

## 2019-03-18 NOTE — Progress Notes (Signed)
HD Tx started    03/18/19 0855  Vital Signs  Pulse Rate 81  Pulse Rate Source Monitor  Resp 14  BP (!) 155/58  BP Location Left Leg  BP Method Automatic  Patient Position (if appropriate) Lying  Oxygen Therapy  SpO2 94 %  O2 Device Room Air  Pulse Oximetry Type Continuous  During Hemodialysis Assessment  Blood Flow Rate (mL/min) 400 mL/min  Arterial Pressure (mmHg) -180 mmHg  Venous Pressure (mmHg) 140 mmHg  Transmembrane Pressure (mmHg) 50 mmHg  Ultrafiltration Rate (mL/min) 1170 mL/min  Dialysate Flow Rate (mL/min) 600 ml/min  Conductivity: Machine  14  HD Safety Checks Performed Yes  Dialysis Fluid Bolus Normal Saline  Bolus Amount (mL) 250 mL  Intra-Hemodialysis Comments Tx initiated

## 2019-03-18 NOTE — Progress Notes (Signed)
HD TX completed    03/18/19 1155  Vital Signs  Pulse Rate 84  Pulse Rate Source Monitor  Resp 15  BP 98/60  BP Location Left Leg  BP Method Automatic  Patient Position (if appropriate) Lying  Oxygen Therapy  SpO2 100 %  O2 Device Room Air  Pulse Oximetry Type Continuous  During Hemodialysis Assessment  HD Safety Checks Performed Yes  KECN 66.8 KECN  Dialysis Fluid Bolus Normal Saline  Bolus Amount (mL) 250 mL  Intra-Hemodialysis Comments Tx completed;Tolerated well

## 2019-03-18 NOTE — Progress Notes (Signed)
Pre HD [facility]    03/18/19 0850  Hand-Off documentation  Report given to (Full Name) Beatris Ship, RN   Report received from (Full Name) Delbert Harness, RN   Vital Signs  Temp 97.6 F (36.4 C)  Temp Source Oral  Pulse Rate 80  Pulse Rate Source Monitor  Resp 16  BP (!) 106/53  BP Location Left Leg  BP Method Automatic  Patient Position (if appropriate) Lying  Oxygen Therapy  SpO2 96 %  O2 Device Room Air  Pulse Oximetry Type Continuous  Pain Assessment  Pain Scale 0-10  Pain Score 0  Dialysis Weight  Weight 88.4 kg  Type of Weight Pre-Dialysis  Time-Out for Hemodialysis  What Procedure? HD   Pt Identifiers(min of two) First/Last Name;MRN/Account#  Correct Site? Yes  Correct Side? Yes  Correct Procedure? Yes  Consents Verified? Yes  Rad Studies Available? N/A  Safety Precautions Reviewed? Yes  Engineer, civil (consulting) Number 5  Station Number 4  UF/Alarm Test Passed  Conductivity: Meter 14  Conductivity: Machine  14  pH 7  Reverse Osmosis Main  Normal Saline Lot Number [id number]  Dialyzer Lot Number [id number]  Disposable Set Lot Number [id number]  Machine Temperature 98.6 F (37 C)  Musician and Audible Yes  Blood Lines Intact and Secured Yes  Pre Treatment Patient Checks  Vascular access used during treatment Catheter  HD catheter dressing before treatment WDL  Hepatitis B Surface Antigen Results Negative  Hepatitis B Surface Antibody  (<10)  Hemodialysis Consent Verified Yes  Hemodialysis Standing Orders Initiated Yes  ECG (Telemetry) Monitor On Yes  Prime Ordered Normal Saline  Length of  DialysisTreatment -hour(s) 3 Hour(s)  Dialysis Treatment Comments Na 140  Dialyzer Elisio 17H NR  Dialysate 2K;2.5 Ca  Dialysis Anticoagulant None  Dialysate Flow Ordered 600  Blood Flow Rate Ordered 400 mL/min  Ultrafiltration Goal 3 Liters  Pre Treatment Labs CBC;Renal panel  Dialysis Blood Pressure Support Ordered Normal Saline  Education / Care Plan   Dialysis Education Provided No (Comment) (Pt non interactive )  Hemodialysis Catheter Left Internal jugular Double lumen Permanent (Tunneled)  Placement Date/Time: 02/23/19 1514   Time Out: Correct patient;Correct site;Correct procedure  Maximum sterile barrier precautions: Hand hygiene;Cap;Mask;Sterile gown;Sterile gloves;Large sterile sheet  Site Prep: Chlorhexidine (preferred)  Local Anes...  Site Condition No complications  Blue Lumen Status Blood return noted  Red Lumen Status Blood return noted

## 2019-03-18 NOTE — Progress Notes (Signed)
[facility]  ROUNDING NOTE   Subjective:   Seen and examined on hemodialysis treatment. Opening eyes spontaneously. Unclear if she is responding with head nods to verbal stimuli.   UF goal of 3 liters.    HEMODIALYSIS FLOWSHEET:  Blood Flow Rate (mL/min): 400 mL/min Arterial Pressure (mmHg): -180 mmHg Venous Pressure (mmHg): 120 mmHg Transmembrane Pressure (mmHg): 40 mmHg Ultrafiltration Rate (mL/min): 830 mL/min Dialysate Flow Rate (mL/min): 600 ml/min Conductivity: Machine : 13.6 Conductivity: Machine : 13.6 Dialysis Fluid Bolus: Normal Saline Bolus Amount (mL): 250 mL Dialysate Change: 4K(Verbal order change by MD for low K+)    Objective:  Vital signs in last 24 hours:  Temp:  [97.6 F (36.4 C)-99 F (37.2 C)] 97.6 F (36.4 C) (01/13 0850) Pulse Rate:  [79-87] 85 (01/13 1115) Resp:  [0-20] 14 (01/13 1115) BP: (82-155)/(36-85) 87/43 (01/13 1115) SpO2:  [94 %-100 %] 98 % (01/13 1115) Weight:  [88.4 kg] 88.4 kg (01/13 0850)  Weight change:  Filed Weights   03/16/19 1100 03/16/19 1400 03/18/19 0850  Weight: 88.7 kg 86.5 kg 88.4 kg    Intake/Output: I/O last 3 completed shifts: In: 2224 [NG/GT:2224] Out: 400 [Stool:400]   Intake/Output this shift:  No intake/output data recorded.  Physical Exam: General: NAD, laying in bed  HEENT [place]/AT   Lungs:  clear  Heart: Regular rate and rhythm  Abdomen:  Soft, nontender, PEG tube in place  Extremities: 2+ upper and lower extremity edema  Neurologic: Lethargic , opens eyes spontaneously.       Access: Left IJ PC      Basic Metabolic Panel: Recent Labs  Lab 03/14/19 0502 03/15/19 0509 03/16/19 0832 03/17/19 0549 03/18/19 0621 03/18/19 1001  NA 138 138 136 137 137 136  K 2.8* 3.8 4.6 4.0 4.2 3.2*  CL 104 101 101 101 100 98  CO2 30 26 25 29 28 29   GLUCOSE 171* 203* 175* 163* 170* 156*  BUN 21 34* 50* 33* 43* 24*  CREATININE 1.90* 2.70* 3.44* 2.30* 2.99* 1.75*  CALCIUM 8.1* 8.1* 7.9* 7.9*  8.1* 7.9*  PHOS 2.0* 4.0 4.9*  --   --  2.4*    Liver Function Tests: Recent Labs  Lab 03/14/19 0502 03/15/19 0509 03/16/19 0832 03/18/19 1001  ALBUMIN 1.8* 1.8* 1.7* 1.8*   No results for input(s): LIPASE, AMYLASE in the last 168 hours. No results for input(s): AMMONIA in the last 168 hours.  CBC: Recent Labs  Lab 03/15/19 0518 03/16/19 0832 03/17/19 0549 03/18/19 0621 03/18/19 1001  WBC 10.4 7.9 6.4 5.7 5.6  HGB 7.5* 7.2* 6.8* 8.2* 8.7*  HCT 24.1* 23.4* 22.2* 25.3* 26.9*  MCV 100.0 101.3* 101.8* 95.8 95.4  PLT 263 218 198 220 203    Cardiac Enzymes: No results for input(s): CKTOTAL, CKMB, CKMBINDEX, TROPONINI in the last 168 hours.  BNP: Invalid input(s): POCBNP  CBG: Recent Labs  Lab 03/17/19 1647 03/17/19 2012 03/17/19 2355 03/18/19 0415 03/18/19 0802  GLUCAP 144* 157* 155* 171* 149*    Microbiology: Results for orders placed or performed during the hospital encounter of 02/09/19  Culture, blood (routine x 2)     Status: Abnormal   Collection Time: 02/09/19  2:29 PM   Specimen: BLOOD LEFT ARM  Result Value Ref Range Status   Specimen Description   Final    BLOOD LEFT ARM Performed at [facility], [address] [place], [place] [address]    Special Requests   Final    BOTTLES DRAWN  AEROBIC AND ANAEROBIC Blood Culture adequate volume Performed at [facility], [address] [place], [place] [address]    Culture  Setup Time   Final    GRAM POSITIVE COCCI IN BOTH AEROBIC AND ANAEROBIC BOTTLES CRITICAL VALUE NOTED.  VALUE IS CONSISTENT WITH PREVIOUSLY REPORTED AND CALLED VALUE. Performed at [facility], [address] [place], [place] [address]    Culture (A)  Final    STAPHYLOCOCCUS AUREUS SUSCEPTIBILITIES PERFORMED ON PREVIOUS CULTURE WITHIN THE LAST 5 DAYS. Performed at [facility], [place] [address], [place], [place] [address]    Report Status 02/12/2019 FINAL  Final  Culture, blood (routine x 2)      Status: Abnormal   Collection Time: 02/09/19  2:38 PM   Specimen: BLOOD LEFT ARM  Result Value Ref Range Status   Specimen Description   Final    BLOOD LEFT ARM Performed at [facility], [address] [place], [place] [address]    Special Requests   Final    BOTTLES DRAWN AEROBIC AND ANAEROBIC Blood Culture adequate volume Performed at [facility], [address], [place], [place] [address]    Culture  Setup Time   Final    GRAM POSITIVE COCCI IN BOTH AEROBIC AND ANAEROBIC BOTTLES CRITICAL RESULT CALLED TO, READ BACK BY AND VERIFIED WITH: [place] [id number] 02/10/2019 HNM Performed at [facility], [place] [address] [place], [place] [address]    Culture METHICILLIN RESISTANT STAPHYLOCOCCUS AUREUS (A)  Final   Report Status 02/12/2019 FINAL  Final   Organism ID, Bacteria METHICILLIN RESISTANT STAPHYLOCOCCUS AUREUS  Final      Susceptibility   Methicillin resistant staphylococcus aureus - MIC*    CIPROFLOXACIN >=8 RESISTANT Resistant     ERYTHROMYCIN >=8 RESISTANT Resistant     GENTAMICIN <=0.5 SENSITIVE Sensitive     OXACILLIN >=4 RESISTANT Resistant     TETRACYCLINE <=1 SENSITIVE Sensitive     VANCOMYCIN 1 SENSITIVE Sensitive     TRIMETH/SULFA <=10 SENSITIVE Sensitive     CLINDAMYCIN RESISTANT Resistant     RIFAMPIN <=0.5 SENSITIVE Sensitive     Inducible Clindamycin POSITIVE Resistant     * METHICILLIN RESISTANT STAPHYLOCOCCUS AUREUS  Blood Culture ID Panel (Reflexed)     Status: Abnormal   Collection Time: 02/09/19  2:38 PM  Result Value Ref Range Status   Enterococcus species NOT DETECTED NOT DETECTED Final   Listeria monocytogenes NOT DETECTED NOT DETECTED Final   Staphylococcus species DETECTED (A) NOT DETECTED Final    Comment: CRITICAL RESULT CALLED TO, READ BACK BY AND VERIFIED WITH: Hart Robinsons PHARMD [id number] 02/10/2019 HNM    Staphylococcus aureus (BCID) DETECTED (A) NOT DETECTED Final    Comment: Methicillin (oxacillin)-resistant  Staphylococcus aureus (MRSA). MRSA is predictably resistant to beta-lactam antibiotics (except ceftaroline). Preferred therapy is vancomycin unless clinically contraindicated. Patient requires contact precautions if  hospitalized. CRITICAL RESULT CALLED TO, READ BACK BY AND VERIFIED WITH: Hart Robinsons PHARMD [id number] 02/10/2019 HNM    Methicillin resistance DETECTED (A) NOT DETECTED Final    Comment: CRITICAL RESULT CALLED TO, READ BACK BY AND VERIFIED WITH: Hart Robinsons PHARMD [id number] 02/10/2019 HNM    Streptococcus species NOT DETECTED NOT DETECTED Final   Streptococcus agalactiae NOT DETECTED NOT DETECTED Final   Streptococcus pneumoniae NOT DETECTED NOT DETECTED Final   Streptococcus pyogenes NOT DETECTED NOT DETECTED Final   Acinetobacter baumannii NOT DETECTED NOT DETECTED Final   Enterobacteriaceae species NOT DETECTED NOT DETECTED Final  Enterobacter cloacae complex NOT DETECTED NOT DETECTED Final   Escherichia coli NOT DETECTED NOT DETECTED Final   Klebsiella oxytoca NOT DETECTED NOT DETECTED Final   Klebsiella pneumoniae NOT DETECTED NOT DETECTED Final   Proteus species NOT DETECTED NOT DETECTED Final   Serratia marcescens NOT DETECTED NOT DETECTED Final   Haemophilus influenzae NOT DETECTED NOT DETECTED Final   Neisseria meningitidis NOT DETECTED NOT DETECTED Final   Pseudomonas aeruginosa NOT DETECTED NOT DETECTED Final   Candida albicans NOT DETECTED NOT DETECTED Final   Candida glabrata NOT DETECTED NOT DETECTED Final   Candida krusei NOT DETECTED NOT DETECTED Final   Candida parapsilosis NOT DETECTED NOT DETECTED Final   Candida tropicalis NOT DETECTED NOT DETECTED Final    Comment: Performed at [facility], [place], [place] [address]  SARS CORONAVIRUS 2 (TAT 6-24 HRS) Nasopharyngeal Nasopharyngeal Swab     Status: None   Collection Time: 02/09/19  2:54 PM   Specimen: Nasopharyngeal Swab  Result Value Ref Range Status   SARS Coronavirus 2 NEGATIVE  NEGATIVE Final    Comment: (NOTE) SARS-CoV-2 target nucleic acids are NOT DETECTED. The SARS-CoV-2 RNA is generally detectable in upper and lower respiratory specimens during the acute phase of infection. Negative results do not preclude SARS-CoV-2 infection, do not rule out co-infections with other pathogens, and should not be used as the sole basis for treatment or other patient management decisions. Negative results must be combined with clinical observations, patient history, and epidemiological information. The expected result is Negative. Fact Sheet for Patients: SugarRoll.be Fact Sheet for Healthcare Providers: https://www.woods-mathews.com/ This test is not yet approved or cleared by the [place] FDA and  has been authorized for detection and/or diagnosis of SARS-CoV-2 by FDA under an Emergency Use Authorization (EUA). This EUA will remain  in effect (meaning this test can be used) for the duration of the COVID-19 declaration under Section 56 4(b)(1) of the Act, 21 U.S.C. section 360bbb-3(b)(1), unless the authorization is terminated or revoked sooner. Performed at [facility], [place] [address] [place], [place] [address]   MRSA PCR Screening     Status: Abnormal   Collection Time: 02/10/19  5:03 PM   Specimen: Nasopharyngeal  Result Value Ref Range Status   MRSA by PCR POSITIVE (A) NEGATIVE Final    Comment:        The GeneXpert MRSA Assay (FDA approved for NASAL specimens only), is one component of a comprehensive MRSA colonization surveillance program. It is not intended to diagnose MRSA infection nor to guide or monitor treatment for MRSA infections. RESULT CALLED TO, READ BACK BY AND VERIFIED WITH: ALEKSEY FRASER @1839  02/10/19 MJU Performed at [facility], [address] [place], [place] [address]   Culture, blood (routine x 2)     Status: Abnormal   Collection Time: 02/11/19 12:31 PM   Specimen:  BLOOD  Result Value Ref Range Status   Specimen Description   Final    BLOOD PORTA CATH Performed at [place] [address], [place], [place] [address]    Special Requests   Final    BOTTLES DRAWN AEROBIC AND ANAEROBIC Blood Culture adequate volume Performed at [facility], [address] [place],  [address]    Culture  Setup Time   Final    GRAM POSITIVE COCCI ANAEROBIC BOTTLE ONLY CRITICAL RESULT CALLED TO, READ BACK BY AND VERIFIED WITH: SCOTT HALL AT [id number] 02/12/2019 SNG  Performed at [facility], [place], [place] [address]    Culture (A)  Final    STAPHYLOCOCCUS AUREUS SUSCEPTIBILITIES PERFORMED ON PREVIOUS CULTURE WITHIN THE LAST 5 DAYS. Performed at [facility], [place] [address] [place], [place] [address]    Report Status 02/13/2019 FINAL  Final  Culture, blood (routine x 2)     Status: None   Collection Time: 02/11/19  3:57 PM   Specimen: BLOOD  Result Value Ref Range Status   Specimen Description BLOOD PORTA CATH  Final   Special Requests   Final    BOTTLES DRAWN AEROBIC AND ANAEROBIC Blood Culture adequate volume   Culture   Final    NO GROWTH 5 DAYS Performed at [facility], [address] [place], [place] [address]    Report Status 02/16/2019 FINAL  Final  CULTURE, BLOOD (ROUTINE X 2) w Reflex to ID Panel     Status: Abnormal   Collection Time: 02/13/19  2:18 PM   Specimen: BLOOD LEFT HAND  Result Value Ref Range Status   Specimen Description BLOOD LEFT HAND  Final   Special Requests   Final    BOTTLES DRAWN AEROBIC AND ANAEROBIC Blood Culture results may not be optimal due to an inadequate volume of blood received in culture bottles   Culture  Setup Time   Final    AEROBIC BOTTLE ONLY GRAM POSITIVE COCCI IN CLUSTERS CRITICAL RESULT CALLED TO, READ BACK BY AND VERIFIED WITH: Herbert Pun AT [id number] ON 02/14/2019 [place]. Performed at [facility], [place] [address] [place], [place]  [address]    Culture METHICILLIN RESISTANT STAPHYLOCOCCUS AUREUS (A)  Final   Report Status 02/16/2019 FINAL  Final   Organism ID, Bacteria METHICILLIN RESISTANT STAPHYLOCOCCUS AUREUS  Final      Susceptibility   Methicillin resistant staphylococcus aureus - MIC*    CIPROFLOXACIN >=8 RESISTANT Resistant     ERYTHROMYCIN >=8 RESISTANT Resistant     GENTAMICIN <=0.5 SENSITIVE Sensitive     OXACILLIN >=4 RESISTANT Resistant     TETRACYCLINE <=1 SENSITIVE Sensitive     VANCOMYCIN <=0.5 SENSITIVE Sensitive     TRIMETH/SULFA <=10 SENSITIVE Sensitive     CLINDAMYCIN RESISTANT Resistant     RIFAMPIN <=0.5 SENSITIVE Sensitive     Inducible Clindamycin POSITIVE Resistant     * METHICILLIN RESISTANT STAPHYLOCOCCUS AUREUS  Blood Culture ID Panel (Reflexed)     Status: Abnormal   Collection Time: 02/13/19  2:18 PM  Result Value Ref Range Status   Enterococcus species NOT DETECTED NOT DETECTED Final   Listeria monocytogenes NOT DETECTED NOT DETECTED Final   Staphylococcus species DETECTED (A) NOT DETECTED Final    Comment: CRITICAL RESULT CALLED TO, READ BACK BY AND VERIFIED WITH: ABBEY ELLINGTON AT [id number] ON 02/14/2019 [place].    Staphylococcus aureus (BCID) DETECTED (A) NOT DETECTED Final    Comment: Methicillin (oxacillin)-resistant Staphylococcus aureus (MRSA). MRSA is predictably resistant to beta-lactam antibiotics (except ceftaroline). Preferred therapy is vancomycin unless clinically contraindicated. Patient requires contact precautions if  hospitalized. CRITICAL RESULT CALLED TO, READ BACK BY AND VERIFIED WITH: Herbert Pun AT [id number] ON 02/14/2019 [place].    Methicillin resistance DETECTED (A) NOT DETECTED Final    Comment: CRITICAL RESULT CALLED TO, READ BACK BY AND VERIFIED WITH: Herbert Pun AT [id number] ON 02/14/2019 [place].    Streptococcus species NOT DETECTED NOT DETECTED Final   Streptococcus agalactiae NOT DETECTED NOT DETECTED Final   Streptococcus pneumoniae NOT DETECTED NOT DETECTED  Final  Streptococcus pyogenes NOT DETECTED NOT DETECTED Final   Acinetobacter baumannii NOT DETECTED NOT DETECTED Final   Enterobacteriaceae species NOT DETECTED NOT DETECTED Final   Enterobacter cloacae complex NOT DETECTED NOT DETECTED Final   Escherichia coli NOT DETECTED NOT DETECTED Final   Klebsiella oxytoca NOT DETECTED NOT DETECTED Final   Klebsiella pneumoniae NOT DETECTED NOT DETECTED Final   Proteus species NOT DETECTED NOT DETECTED Final   Serratia marcescens NOT DETECTED NOT DETECTED Final   Haemophilus influenzae NOT DETECTED NOT DETECTED Final   Neisseria meningitidis NOT DETECTED NOT DETECTED Final   Pseudomonas aeruginosa NOT DETECTED NOT DETECTED Final   Candida albicans NOT DETECTED NOT DETECTED Final   Candida glabrata NOT DETECTED NOT DETECTED Final   Candida krusei NOT DETECTED NOT DETECTED Final   Candida parapsilosis NOT DETECTED NOT DETECTED Final   Candida tropicalis NOT DETECTED NOT DETECTED Final    Comment: Performed at [facility], [address] [place], [place] [address]  CULTURE, BLOOD (ROUTINE X 2) w Reflex to ID Panel     Status: None   Collection Time: 02/13/19  3:09 PM   Specimen: BLOOD  Result Value Ref Range Status   Specimen Description BLOOD BLOOD LEFT HAND  Final   Special Requests   Final    BOTTLES DRAWN AEROBIC AND ANAEROBIC Blood Culture adequate volume   Culture   Final    NO GROWTH 5 DAYS Performed at [facility], [address] [place], [place] [address]    Report Status 02/18/2019 FINAL  Final  Culture, blood (routine x 2)     Status: Abnormal   Collection Time: 02/15/19 12:49 PM   Specimen: BLOOD  Result Value Ref Range Status   Specimen Description   Final    BLOOD LT HAND Performed at [facility], [address] [place], [place] [address]    Special Requests   Final    BOTTLES DRAWN AEROBIC AND ANAEROBIC Blood Culture results may not be optimal due to an inadequate volume of blood  received in culture bottles Performed at [facility], [address] [place], [place] [address]    Culture  Setup Time   Final    GRAM POSITIVE COCCI IN BOTH AEROBIC AND ANAEROBIC BOTTLES CRITICAL RESULT CALLED TO, READ BACK BY AND VERIFIED WITHEleonore Chiquito AT [id number] 02/16/2019 SDR Performed at [facility], [address] [place], [place] [address]    Culture (A)  Final    METHICILLIN RESISTANT STAPHYLOCOCCUS AUREUS STAPHYLOCOCCUS SPECIES (COAGULASE NEGATIVE) THE SIGNIFICANCE OF ISOLATING THIS ORGANISM FROM A SINGLE SET OF BLOOD CULTURES WHEN MULTIPLE SETS ARE DRAWN IS UNCERTAIN. PLEASE NOTIFY THE MICROBIOLOGY DEPARTMENT WITHIN ONE WEEK IF SPECIATION AND SENSITIVITIES ARE REQUIRED. Performed at [facility], [place] [address], [place], [place] [address]    Report Status 02/18/2019 FINAL  Final   Organism ID, Bacteria METHICILLIN RESISTANT STAPHYLOCOCCUS AUREUS  Final      Susceptibility   Methicillin resistant staphylococcus aureus - MIC*    CIPROFLOXACIN >=8 RESISTANT Resistant     ERYTHROMYCIN >=8 RESISTANT Resistant     GENTAMICIN <=0.5 SENSITIVE Sensitive     OXACILLIN >=4 RESISTANT Resistant     TETRACYCLINE <=1 SENSITIVE Sensitive     VANCOMYCIN <=0.5 SENSITIVE Sensitive     TRIMETH/SULFA <=10 SENSITIVE Sensitive     CLINDAMYCIN RESISTANT Resistant     RIFAMPIN <=0.5 SENSITIVE Sensitive     Inducible Clindamycin POSITIVE Resistant     * METHICILLIN  RESISTANT STAPHYLOCOCCUS AUREUS  Aerobic Culture (superficial specimen)     Status: None   Collection Time: 02/17/19  1:31 PM   Specimen: Heel; Wound  Result Value Ref Range Status   Specimen Description   Final    HEEL Performed at [facility], [address] [place], [place] [address]    Special Requests   Final    NONE Performed at [facility], [address] [place], [place] [address]    Gram Stain   Final    NO WBC SEEN MODERATE GRAM POSITIVE COCCI IN PAIRS     Culture   Final    ABUNDANT METHICILLIN RESISTANT STAPHYLOCOCCUS AUREUS ABUNDANT DIPHTHEROIDS(CORYNEBACTERIUM SPECIES) Standardized susceptibility testing for this organism is not available. Performed at [facility], [place] [address], [place], [place] [address]    Report Status 02/19/2019 FINAL  Final   Organism ID, Bacteria METHICILLIN RESISTANT STAPHYLOCOCCUS AUREUS  Final      Susceptibility   Methicillin resistant staphylococcus aureus - MIC*    CIPROFLOXACIN >=8 RESISTANT Resistant     ERYTHROMYCIN >=8 RESISTANT Resistant     GENTAMICIN <=0.5 SENSITIVE Sensitive     OXACILLIN >=4 RESISTANT Resistant     TETRACYCLINE <=1 SENSITIVE Sensitive     VANCOMYCIN 1 SENSITIVE Sensitive     TRIMETH/SULFA <=10 SENSITIVE Sensitive     CLINDAMYCIN RESISTANT Resistant     RIFAMPIN <=0.5 SENSITIVE Sensitive     Inducible Clindamycin POSITIVE Resistant     * ABUNDANT METHICILLIN RESISTANT STAPHYLOCOCCUS AUREUS  CULTURE, BLOOD (ROUTINE X 2) w Reflex to ID Panel     Status: None   Collection Time: 02/18/19 11:29 PM   Specimen: BLOOD  Result Value Ref Range Status   Specimen Description BLOOD LEFT ARM  Final   Special Requests   Final    BOTTLES DRAWN AEROBIC AND ANAEROBIC Blood Culture adequate volume   Culture   Final    NO GROWTH 5 DAYS Performed at [facility], [address] [place], [place] [address]    Report Status 02/23/2019 FINAL  Final  CULTURE, BLOOD (ROUTINE X 2) w Reflex to ID Panel     Status: None   Collection Time: 02/18/19 11:29 PM   Specimen: BLOOD  Result Value Ref Range Status   Specimen Description BLOOD LEFT ARM  Final   Special Requests   Final    BOTTLES DRAWN AEROBIC AND ANAEROBIC Blood Culture adequate volume   Culture   Final    NO GROWTH 5 DAYS Performed at [facility] [address], [place], [place] [address]    Report Status 02/23/2019 FINAL  Final  Aerobic/Anaerobic Culture (surgical/deep wound)     Status: None   Collection  Time: 03/04/19 11:04 AM   Specimen: PATH Other; Tissue  Result Value Ref Range Status   Specimen Description   Final    ARTERY Performed at [facility], [address] [place], [place] [address]    Special Requests   Final    RIGHT AV GRAFT AND STENTS CULTURE Performed at [facility], [address] [place], [place] [address]    Gram Stain   Final    RARE WBC PRESENT, PREDOMINANTLY PMN NO ORGANISMS SEEN    Culture   Final    No growth aerobically or anaerobically. Performed at [facility], [place] [address], [place],  [address]    Report Status 03/09/2019 FINAL  Final  Aerobic/Anaerobic Culture (surgical/deep wound)  Status: None   Collection Time: 03/04/19 11:29 AM   Specimen: PATH Other; Tissue  Result Value Ref Range Status   Specimen Description   Final    ARTERY Performed at [facility], [address], [place], [place] [address]    Special Requests   Final    FLUID AROUND AV GRAFT Performed at [facility], [address] [place], [place] [address]    Gram Stain   Final    FEW WBC PRESENT, PREDOMINANTLY PMN NO ORGANISMS SEEN    Culture   Final    No growth aerobically or anaerobically. Performed at [facility], [place] [address] [place], [place] [address]    Report Status 03/09/2019 FINAL  Final  Wet prep, genital     Status: Abnormal   Collection Time: 03/16/19 10:01 AM  Result Value Ref Range Status   Yeast Wet Prep HPF POC PRESENT (A) NONE SEEN Final   Trich, Wet Prep NONE SEEN NONE SEEN Final   Clue Cells Wet Prep HPF POC NONE SEEN NONE SEEN Final   WBC, Wet Prep HPF POC MANY (A) NONE SEEN Final   Sperm NONE SEEN  Final    Comment: Performed at [facility], [address] [place], [place] [address]  Aerobic Culture (superficial specimen)     Status: None (Preliminary result)   Collection Time: 03/17/19  5:13 PM   Specimen: Vaginal Fluid  Result Value Ref Range Status    Specimen Description   Final    VAGINA Performed at [facility], [address] [place], [place] [address]    Special Requests   Final    NONE Performed at [facility], [address] [place], [place] [address]    Gram Stain   Final    MODERATE WBC PRESENT,BOTH PMN AND MONONUCLEAR FEW GRAM POSITIVE COCCI IN PAIRS FEW GRAM NEGATIVE RODS    Culture   Final    ABUNDANT GRAM NEGATIVE RODS CULTURE REINCUBATED FOR BETTER GROWTH Performed at [facility], [place] [address], [place], [place] [address]    Report Status PENDING  Incomplete    Coagulation Studies: No results for input(s): LABPROT, INR in the last 72 hours.  Urinalysis: No results for input(s): COLORURINE, LABSPEC, PHURINE, GLUCOSEU, HGBUR, BILIRUBINUR, KETONESUR, PROTEINUR, UROBILINOGEN, NITRITE, LEUKOCYTESUR in the last 72 hours.  Invalid input(s): APPERANCEUR    Imaging: No results found.   Medications:   . sodium chloride 10 mL (03/15/19 1758)  . feeding supplement (OSMOLITE 1.5 CAL) 1,000 mL (03/17/19 1737)  . heparin 800 Units/hr (03/17/19 2053)  . vancomycin Stopped (03/16/19 1358)   . aspirin  81 mg Per Tube Daily  . B-complex with vitamin C  1 tablet Per Tube Daily  . Chlorhexidine Gluconate Cloth  6 each Topical Q0600  . clotrimazole  1 Applicatorful Vaginal QHS  . epoetin (EPOGEN/PROCRIT) injection  10,000 Units Intravenous Q M,W,F-HD  . feeding supplement (PRO-STAT SUGAR FREE 64)  30 mL Per Tube Daily  . fluticasone  2 spray Each Nare Daily  . free water  20 mL Per Tube Q4H  . insulin aspart  0-15 Units Subcutaneous Q4H  . levothyroxine  100 mcg Per Tube QAC breakfast  . mouth rinse  15 mL Mouth Rinse BID  . pantoprazole (PROTONIX) IV  40 mg Intravenous Q12H  . psyllium  1 packet Per Tube TID  . venlafaxine  75 mg Per Tube BID WC  . zinc sulfate  220  mg Per Tube Daily   sodium chloride, acetaminophen, ipratropium-albuterol, ondansetron (ZOFRAN) IV,  traMADol  Assessment/ Plan:  [name] is a 64 y.o. white female with end-stage renal disease on hemodialysis, atrial fibrillation, chronic diastolic congestive heart failure, coronary artery disease status post CABG, diabetes mellitus type II, hypertension, hyperlipidemia, hypothyroidism, Covid positive in July 2020, recent nursing home stay in [place] Prolonged hospitalization for MRSA bacteremia/sepsis and now with acute encephalopathy  [facility] MWF [facility] [address] Left AVG 81.5kg  1. End Stage Renal Disease: on hemodialysis. With complication of dialysis device. Right arm AVG removed 12/30 due to suspected source of bacteremia Now with left IJ permcath - Continue MWF schedule. Seen and examined on hemodialysis treatment.  - Consider albumin with HD treatments.    2. Hypotension -Continue midodrine 10 mg 3 times daily.  3. Anemia of chronic kidney disease: hemoglobin 8.7 EPO with HD treatments.   4. Secondary Hyperparathyroidism: phosphorus and calcium within goal.   5. Acute Encephalopathy: No significant change in mental status.   - Appreciate Neurology input. Showed some improvement between both EEGs - Thiamine - Subjective improvements since beginning of the week.    LOS: [place] 1/13/202111:55 AM

## 2019-03-18 NOTE — Progress Notes (Signed)
Post HD Tx    03/18/19 1206  Hand-Off documentation  Report given to (Full Name) Delbert Harness, RN   Report received from (Full Name) Beatris Ship, RN   Vital Signs  Temp (!) 97.4 F (36.3 C)  Temp Source Oral  Pulse Rate 83  Pulse Rate Source Monitor  Resp 14  BP (!) 130/51  BP Location Left Leg  BP Method Automatic  Patient Position (if appropriate) Lying  Oxygen Therapy  SpO2 100 %  O2 Device Room Air  Pulse Oximetry Type Continuous  Pain Assessment  Pain Scale 0-10  Pain Score 0  Faces Pain Scale 0  Dialysis Weight  Weight 84.3 kg  Type of Weight Post-Dialysis  Post-Hemodialysis Assessment  Rinseback Volume (mL) 250 mL  KECN 66.8 V  Dialyzer Clearance Lightly streaked  Duration of HD Treatment -hour(s) 3 hour(s)  Hemodialysis Intake (mL) 500 mL  UF Total -Machine (mL) 3500 mL  Net UF (mL) 3000 mL  Tolerated HD Treatment Yes  Hemodialysis Catheter Left Internal jugular Double lumen Permanent (Tunneled)  Placement Date/Time: 02/23/19 1514   Time Out: Correct patient;Correct site;Correct procedure  Maximum sterile barrier precautions: Hand hygiene;Cap;Mask;Sterile gown;Sterile gloves;Large sterile sheet  Site Prep: Chlorhexidine (preferred)  Local Anes...  Site Condition No complications  Blue Lumen Status Heparin locked  Red Lumen Status Heparin locked  Post treatment catheter status Capped and Clamped

## 2019-03-18 NOTE — Progress Notes (Signed)
PROGRESS NOTE    SHALAYNE BLEICHER  [id number] DOB: Oct 19, 1955 DOA: 02/09/2019 PCP: [name], MD   Brief Narrative:  [name] is a 64 y.o. white female with end-stage renal disease on hemodialysis, atrial fibrillation, chronic diastolic congestive heart failure, coronary artery disease status post CABG, diabetes mellitus type II, hypertension, hyperlipidemia, hypothyroidism, Covid positive in July 2020, recent nursing home stay in [place]. Prolonged hospitalization for MRSA bacteremia/sepsis and now with acute encephalopathy.  Subjective: Patient received her hemodialysis this morning.  When calls opens eyes but does not respond verbally.  Also does not nod the head.  Appears calm.  Assessment & Plan:   Principal Problem:   Sepsis due to methicillin resistant Staphylococcus aureus (MRSA) ([place]) Active Problems:   Type 2 diabetes mellitus with ESRD (end-stage renal disease) (HCC)   Atrial fibrillation, chronic (HCC)   Hypotension   Chronic diastolic CHF (congestive heart failure) (HCC)   ESRD (end stage renal disease) (HCC)   Altered level of consciousness   Decubitus ulcer of heel, bilateral, stage 2 (HCC)   MRSA bacteremia   Arm DVT (deep venous thromboembolism), acute, left (HCC)   Acute encephalopathy   Acute metabolic encephalopathy   Acute blood loss anemia   AF (paroxysmal atrial fibrillation) ([place])   Diarrhea   Oropharyngeal dysphagia   Abscess  Sepsis secondary to MRSA bacteremia.  No obvious source.  Right arm graft was removed on 03/04/2019. -Continue with vancomycin for 6-week, will finish her course on 04/01/2019.  Acute metabolic encephalopathy:  mental status remains unchanged -  MRI of brain and EEG negative. Patient remained afebrile with no leukocytosis.  B12 within normal range, folate and B1 results are pending.  -  She was started on thiamine challenge by neurology but not much improvement. - Because of persistent encephalopathy -  Neuro recommends LP which is requested for Thursday, 03/19/2019, under IR, she needs to be off Eliquis for 48 hrs.   - Cont heparin drip in the interim Repeat TSH was elevated. - Increased dose of Synthroid to 100 mcg -Overall prognosis seems poor.   Bleeding from catheter site with multiple ecchymosis.  No new episode over the past 24 to 48 hours -  warfarin and heparin was discontinued due to concern for necrosis of skin.  Hemoglobin dropped to 6.8 -Holding Eliquis & switch over to Heparin drip for LP.  Vaginal discharge.  There was some nursing concern of vaginal discharge  -there is some yeast but Gyn feels this may be colonization - may not need treatment. She is not symptomatic  Anemia.  Status post transfusion 1 unit of PRBC on 03/17/2019 -  hemoglobin 8.7 today.  Iron studies consistent with anemia of chronic illness.  MCV elevated.  -B12 within normal range -Continue EPO with dialysis.  Hypotension.  Resolved now  ESRD (M WF).   Currently using tunneled catheter as graft was removed due to MRSA bacteremia. -Continue with scheduled dialysis.  Left arm DVT.  Patient was on Heparin.  Recent diagnosis of left arm DVT on 03/03/2019. -Discontinue Coumadin due to concern for Coumadin toxicity/side effect due to her skin necrotic lesions. -holding Eliquis for LP and switched over to Heparin drip  Hypothyroidism.  Repeat TSH elevated at 17.82. -Increased the dose of Synthroid to 100. - repeat TSH in 2 to 3 weeks.  Paroxysmal A. Fib.. Currently in sinus. Amiodarone was discontinued. -Holding Eliquis for LP and started heparin drip  Type 2 diabetes. -Continue SSI.  Chronic  diastolic heart failure.  Volume being managed by dialysis.  Bilateral decubitus ulcers.  Present on admission. -Continue with wound care.  Nutrition.  PEG placed 03/03/2019.  Continue tube feeding.  Acute hypoxic respiratory failure.  Resolved.  Patient was intubated initially, extubated on  03/05/2019.  Objective: Vitals:   03/18/19 1145 03/18/19 1200 03/18/19 1204 03/18/19 1206  BP: 98/60 (!) 74/39 (!) 87/36 (!) 130/51  Pulse: 86 84 83 83  Resp: 19 14 14 14   Temp:      TempSrc:      SpO2: 100% 97% 91% 100%  Weight:      Height:        Intake/Output Summary (Last 24 hours) at 03/18/2019 1324 Last data filed at 03/17/2019 1738 Gross per 24 hour  Intake 2224 ml  Output --  Net 2224 ml   Filed Weights   03/16/19 1100 03/16/19 1400 03/18/19 0850  Weight: 88.7 kg 86.5 kg 88.4 kg    Examination:  General exam: Chronically ill-appearing lady, minimal response to painful stimuli, in no acute distress. Respiratory system: Clear to auscultation. Respiratory effort normal. Cardiovascular system: S1 & S2 heard, RRR. No JVD, murmurs, rubs, gallops or clicks. Gastrointestinal system: Soft, nontender, nondistended, bowel sounds positive. Central nervous system: Unable to assess as patient is minimally responsive and not following any commands. Extremities: Bilateral upper extremity edema, no cyanosis, pulses intact and symmetrical. Skin: Decubitus ulcers on both heels.  Multiple ecchymosis on all extremities and upper chest, appears healing now. Psychiatry: Judgement and insight appear impaired.  DVT prophylaxis: Eliquis Code Status: DNR Family Communication: Husband was updated at bedside. Disposition Plan: Waiting for bed offer at [facility]  Consultants:   Nephrology  Infectious disease  Vascular surgery  Neurology  Critical care specialist   Procedures:  Antimicrobials:  Vancomycin.  Data Reviewed: I have personally reviewed following labs and imaging studies  CBC: Recent Labs  Lab 03/15/19 0518 03/16/19 0832 03/17/19 0549 03/18/19 0621 03/18/19 1001  WBC 10.4 7.9 6.4 5.7 5.6  HGB 7.5* 7.2* 6.8* 8.2* 8.7*  HCT 24.1* 23.4* 22.2* 25.3* 26.9*  MCV 100.0 101.3* 101.8* 95.8 95.4  PLT 263 218 198 220 [id number]   Basic Metabolic Panel: Recent Labs   Lab 03/14/19 0502 03/15/19 0509 03/16/19 0832 03/17/19 0549 03/18/19 0621 03/18/19 1001  NA 138 138 136 137 137 136  K 2.8* 3.8 4.6 4.0 4.2 3.2*  CL 104 101 101 101 100 98  CO2 30 26 25 29 28 29   GLUCOSE 171* 203* 175* 163* 170* 156*  BUN 21 34* 50* 33* 43* 24*  CREATININE 1.90* 2.70* 3.44* 2.30* 2.99* 1.75*  CALCIUM 8.1* 8.1* 7.9* 7.9* 8.1* 7.9*  PHOS 2.0* 4.0 4.9*  --   --  2.4*   GFR: Estimated Creatinine Clearance: 34.5 mL/min (A) (by C-G formula based on SCr of 1.75 mg/dL (H)). Liver Function Tests: Recent Labs  Lab 03/14/19 0502 03/15/19 0509 03/16/19 0832 03/18/19 1001  ALBUMIN 1.8* 1.8* 1.7* 1.8*   No results for input(s): LIPASE, AMYLASE in the last 168 hours. No results for input(s): AMMONIA in the last 168 hours. Coagulation Profile: Recent Labs  Lab 03/12/19 0536 03/13/19 0618  INR 1.2 1.4*   Cardiac Enzymes: No results for input(s): CKTOTAL, CKMB, CKMBINDEX, TROPONINI in the last 168 hours. BNP (last 3 results) No results for input(s): PROBNP in the last 8760 hours. HbA1C: No results for input(s): HGBA1C in the last 72 hours. CBG: Recent Labs  Lab 03/17/19 1647 03/17/19 2012 03/17/19  2355 03/18/19 0415 03/18/19 0802  GLUCAP 144* 157* 155* 171* 149*   Lipid Profile: No results for input(s): CHOL, HDL, LDLCALC, TRIG, CHOLHDL, LDLDIRECT in the last 72 hours. Thyroid Function Tests: Recent Labs    03/15/19 1443  T4TOTAL 4.1*   Anemia Panel: No results for input(s): VITAMINB12, FOLATE, FERRITIN, TIBC, IRON, RETICCTPCT in the last 72 hours. Sepsis Labs: No results for input(s): PROCALCITON, LATICACIDVEN in the last 168 hours.  Recent Results (from the past 240 hour(s))  Wet prep, genital     Status: Abnormal   Collection Time: 03/16/19 10:01 AM  Result Value Ref Range Status   Yeast Wet Prep HPF POC PRESENT (A) NONE SEEN Final   Trich, Wet Prep NONE SEEN NONE SEEN Final   Clue Cells Wet Prep HPF POC NONE SEEN NONE SEEN Final   WBC, Wet  Prep HPF POC MANY (A) NONE SEEN Final   Sperm NONE SEEN  Final    Comment: Performed at [name], [address], [place], [place] [address]  Aerobic Culture (superficial specimen)     Status: None (Preliminary result)   Collection Time: 03/17/19  5:13 PM   Specimen: Vaginal Fluid  Result Value Ref Range Status   Specimen Description   Final    VAGINA Performed at [facility], [address] [place], [place] [address]    Special Requests   Final    NONE Performed at [facility], [address] [place], [place] [address]    Gram Stain   Final    MODERATE WBC PRESENT,BOTH PMN AND MONONUCLEAR FEW GRAM POSITIVE COCCI IN PAIRS FEW GRAM NEGATIVE RODS    Culture   Final    ABUNDANT GRAM NEGATIVE RODS CULTURE REINCUBATED FOR BETTER GROWTH Performed at [facility], [place] [address] [place], [place] [address]    Report Status PENDING  Incomplete     Radiology Studies: No results found.  Scheduled Meds: . aspirin  81 mg Per Tube Daily  . B-complex with vitamin C  1 tablet Per Tube Daily  . Chlorhexidine Gluconate Cloth  6 each Topical Q0600  . clotrimazole  1 Applicatorful Vaginal QHS  . epoetin (EPOGEN/PROCRIT) injection  10,000 Units Intravenous Q M,W,F-HD  . feeding supplement (PRO-STAT SUGAR FREE 64)  30 mL Per Tube Daily  . fluticasone  2 spray Each Nare Daily  . free water  20 mL Per Tube Q4H  . insulin aspart  0-15 Units Subcutaneous Q4H  . levothyroxine  100 mcg Per Tube QAC breakfast  . mouth rinse  15 mL Mouth Rinse BID  . pantoprazole (PROTONIX) IV  40 mg Intravenous Q12H  . psyllium  1 packet Per Tube TID  . venlafaxine  75 mg Per Tube BID WC  . zinc sulfate  220 mg Per Tube Daily   Continuous Infusions: . sodium chloride 10 mL (03/15/19 1758)  . feeding supplement (OSMOLITE 1.5 CAL) 1,000 mL (03/18/19 1320)  . heparin 650 Units/hr (03/18/19 1319)  . vancomycin Stopped (03/16/19 1358)     LOS: 37 days   Time spent:  35 min.   Thornell Mule, MD [facility] Pager [phone]  If 7PM-7AM, please contact night-coverage [email or web address] Password [facility] 03/18/2019, 1:24 PM   This record has been created using Systems analyst. Errors have been sought and corrected,but may not always be located. Such creation errors do not reflect on the standard of care.

## 2019-03-18 NOTE — Consult Note (Signed)
ANTICOAGULATION CONSULT NOTE  Pharmacy Consult for heparin Indication: DVT  Patient Measurements: Height: 5' 4.02" (162.6 cm) Weight: 190 lb 11.2 oz (86.5 kg) IBW/kg (Calculated) : 54.74 Heparin Dosing Weight: 71 kg  Vital Signs: Temp: 98.8 F (37.1 C) (01/13 0411) Temp Source: Oral (01/13 0411) BP: 133/51 (01/13 0411) Pulse Rate: 80 (01/13 0411)  Labs: Recent Labs    03/16/19 0832 03/17/19 0549 03/17/19 1919 03/18/19 0621  HGB 7.2* 6.8*  --  8.2*  HCT 23.4* 22.2*  --  25.3*  PLT 218 198  --  220  APTT  --   --  59*  --   HEPARINUNFRC  --   --  2.88*  --   CREATININE 3.44* 2.30*  --  2.99*   Estimated Creatinine Clearance: 20 mL/min (A) (by C-G formula based on SCr of 2.99 mg/dL (H)).  Medical History: Past Medical History:  Diagnosis Date  . Anemia   . Chronic diastolic CHF (congestive heart failure) ([place])    a. Due to ischemic cardiomyopathy. EF as low as 35%, improved to normal s/p CABG; b. echo 07/06/13: EF 55-60%, no RWMAs, mod TR, trivial pericardial effusion not c/w tamponade physiology;  c. 10/2015 Echo: EF 65%, Gr1 DD, triv AI, mild MR, mildly dil LA, mod TR, PASP 55mmHg.  Marland Kitchen Coronary artery disease    a. NSTEMI 06/2013; b.cath: severe three-vessel CAD w/ EF 30% & mild-mod MR; c. s/p 3 vessel CABG 07/02/13 (LIMA-LAD, SVG-OM, and SVG-RPDA);  d. 10/2015 MV: no ischemia/infarct.  . Diabetes mellitus without complication ([place])   . Diabetic neuropathy ([place])   . Dialysis patient [facility])    MWF  . ESRD (end stage renal disease) ([place])    a. 12/2015 initiated - mwf dialysis.  Marland Kitchen GERD (gastroesophageal reflux disease)   . Hyperlipidemia   . Hypertension   . Hypothyroidism   . Myocardial infarction ([place]) 2015  . Neuropathy   . Pleural effusion 2015  . Pulmonary hypertension ([place])   . Renal insufficiency   . Wears dentures    full lower    Medications:  Scheduled:  . aspirin  81 mg Per Tube Daily  . B-complex with vitamin C  1 tablet Per Tube Daily  . Chlorhexidine  Gluconate Cloth  6 each Topical Q0600  . clotrimazole  1 Applicatorful Vaginal QHS  . epoetin (EPOGEN/PROCRIT) injection  10,000 Units Intravenous Q M,W,F-HD  . feeding supplement (PRO-STAT SUGAR FREE 64)  30 mL Per Tube Daily  . fluticasone  2 spray Each Nare Daily  . free water  20 mL Per Tube Q4H  . insulin aspart  0-15 Units Subcutaneous Q4H  . levothyroxine  100 mcg Per Tube QAC breakfast  . mouth rinse  15 mL Mouth Rinse BID  . pantoprazole (PROTONIX) IV  40 mg Intravenous Q12H  . psyllium  1 packet Per Tube TID  . venlafaxine  75 mg Per Tube BID WC  . zinc sulfate  220 mg Per Tube Daily    Assessment: 64 y.o. female admitted on 02/09/2019 with MRSA  bacteremia. She was on apixaban PTA but last dose was prior to admission (12/7). [place] 12/15 revealed age-indeterminate short segment occlusive DVT involving the mid humeral aspect of one of the paired brachial veins. On 12/20 a femoral central line was placed. She was on heparin until 03/13/18 when she was changed to apixaban. However, an LP is being ordered in the setting of continued confusion and she is being transitioned to heparin until the procedure  is performed. Her last dose of apixaban was 0928 03/17/19. H&H trending down, requiring intermittent transfusions, although there is no active bleeding noted (noted improvement today),  PLT wnl, baseline aPTT 59s and heparin level 2.88 U/mL  Goal of Therapy:  Heparin Level: 0.3 - 0.7 units/mL aPTT 66 - 102s Monitor platelets by anticoagulation protocol: Yes   Heparin Course: 1/12 initiation: 800 units/hr 1/13 0648 aPTT  126s, HL 2.16: reduce to 650 units/hr  Plan:   Reduce heparin infusion to 650 units/hr   aPTT will be used to guide infusion rate until heparin level and aPTT correlate  aPTT 8 hours after rate change  CBC, heparin level in am  Vallery Sa, PharmD 03/18/2019 7:44 AM

## 2019-03-18 NOTE — Progress Notes (Signed)
Pre HD Assessment    03/18/19 0845  Neurological  Level of Consciousness Alert  Respiratory  Respiratory Pattern Regular  Chest Assessment Chest expansion symmetrical  Bilateral Breath Sounds Clear;Diminished  Cough None  Cardiac  Pulse Irregular  Heart Sounds S1, S2  ECG Monitor Yes  Cardiac Rhythm NSR  Vascular  R Radial Pulse +2  L Radial Pulse +2  Edema Generalized  Generalized Edema +3  Psychosocial  Psychosocial (WDL) X  Patient Behaviors Not interactive  Emotional support given Given to patient

## 2019-03-18 NOTE — Consult Note (Signed)
Pharmacy Antibiotic Note  Jamie Arias is a 64 y.o. female admitted on 02/09/2019 with methicillin resistant Staphylococcus aureus bacteremia.  Pharmacy has been consulted for vancomycin dosing (previous antibiotics included daptomycin and ceftaroline) She recently underwent AVG removal on 03/04/19.  She will require IV vancomycin until 04/01/19 with goal pre-HD vancomycin levels 20 - 25 mcg/mL based on recommendations by Dr Delaine [name].  Vancomycin Levels (pre-HD):  03/06/19 0345 24 mcg/mL 03/09/19 0528 20 mcg/mL  03/11/19 0502 20 mcg/mL 03/18/19 0622 20 mcg/mL    Plan:  continue vancomycin 750 mg IV qHD MWF  follow levels once weekly unless otherwise clinically indicated  next vancomycin level 1/20 prior to dialysis.  Height: 5' 4.02" (162.6 cm) Weight: 190 lb 11.2 oz (86.5 kg) IBW/kg (Calculated) : 54.74  Temp (24hrs), Avg:98.3 F (36.8 C), Min:97.6 F (36.4 C), Max:99 F (37.2 C)  Recent Labs  Lab 03/14/19 0502 03/15/19 0509 03/15/19 0518 03/16/19 0832 03/17/19 0549 03/18/19 0621 03/18/19 0622  WBC 9.1  --  10.4 7.9 6.4 5.7  --   CREATININE 1.90* 2.70*  --  3.44* 2.30* 2.99*  --   VANCORANDOM  --   --   --   --   --   --  20    Estimated Creatinine Clearance: 20 mL/min (A) (by C-G formula based on SCr of 2.99 mg/dL (H)).    Antimicrobials this admission: ceftaroline 12/14 >> 12/21 daptomycin 12/14 >> 12/26 Vancomycin 12/24 >>  Microbiology results: 12/30 Aerobic/Anaerobic Cx:  NG 12/16 BCx: NG 12/15 WCx MRSA 12/13 BCx: MRSA  12/11 BCx: MRSA  Thank you for allowing pharmacy to be a part of this patient's care.  Vallery Sa, PharmD Clinical Pharmacist 03/18/2019 7:47 AM

## 2019-03-19 LAB — APTT
aPTT: 100 seconds — ABNORMAL HIGH (ref 24–36)
aPTT: 73 seconds — ABNORMAL HIGH (ref 24–36)

## 2019-03-19 LAB — GLUCOSE, CAPILLARY
Glucose-Capillary: 166 mg/dL — ABNORMAL HIGH (ref 70–99)
Glucose-Capillary: 152 mg/dL — ABNORMAL HIGH (ref 70–99)
Glucose-Capillary: 143 mg/dL — ABNORMAL HIGH (ref 70–99)
Glucose-Capillary: 83 mg/dL (ref 70–99)
Glucose-Capillary: 202 mg/dL — ABNORMAL HIGH (ref 70–99)
Glucose-Capillary: 128 mg/dL — ABNORMAL HIGH (ref 70–99)
Glucose-Capillary: 146 mg/dL — ABNORMAL HIGH (ref 70–99)

## 2019-03-19 LAB — CBC
HCT: 27.1 % — ABNORMAL LOW (ref 36.0–46.0)
Hemoglobin: 8.7 g/dL — ABNORMAL LOW (ref 12.0–15.0)
MCH: 31.1 pg (ref 26.0–34.0)
MCHC: 32.1 g/dL (ref 30.0–36.0)
MCV: 96.8 fL (ref 80.0–100.0)
Platelets: 228 10*3/uL (ref 150–400)
RBC: 2.8 MIL/uL — ABNORMAL LOW (ref 3.87–5.11)
RDW: 20.6 % — ABNORMAL HIGH (ref 11.5–15.5)
WBC: 4.6 10*3/uL (ref 4.0–10.5)
nRBC: 0 % (ref 0.0–0.2)

## 2019-03-19 LAB — BASIC METABOLIC PANEL
Anion gap: 6 (ref 5–15)
BUN: 28 mg/dL — ABNORMAL HIGH (ref 8–23)
CO2: 29 mmol/L (ref 22–32)
Calcium: 7.9 mg/dL — ABNORMAL LOW (ref 8.9–10.3)
Chloride: 101 mmol/L (ref 98–111)
Creatinine, Ser: 2.16 mg/dL — ABNORMAL HIGH (ref 0.44–1.00)
GFR calc Af Amer: 27 mL/min — ABNORMAL LOW (ref >60)
GFR calc non Af Amer: 23 mL/min — ABNORMAL LOW (ref >60)
Glucose, Bld: 154 mg/dL — ABNORMAL HIGH (ref 70–99)
Potassium: 4.1 mmol/L (ref 3.5–5.1)
Sodium: 136 mmol/L (ref 135–145)

## 2019-03-19 LAB — FOLATE: Folate: 4.5 ng/mL — ABNORMAL LOW (ref >5.9)

## 2019-03-19 LAB — HEPARIN LEVEL (UNFRACTIONATED): Heparin Unfractionated: 0.9 IU/mL — ABNORMAL HIGH (ref 0.30–0.70)

## 2019-03-19 NOTE — Progress Notes (Signed)
Per Rachael Fee, NP ok to draw from femoral line for heparin labs. Orders followed. Will continue to monitor.

## 2019-03-19 NOTE — Progress Notes (Signed)
[facility]  ROUNDING NOTE   Subjective:   Husband at bedside. States her mental status is subjectively improving.   Hemodialysis treatment yesterday. UF of 3 liters   Objective:  Vital signs in last 24 hours:  Temp:  [98.9 F (37.2 C)-99 F (37.2 C)] 98.9 F (37.2 C) (01/14 1202) Pulse Rate:  [79-81] 79 (01/14 1202) Resp:  [16-20] 16 (01/14 1202) BP: (115-135)/(46-99) 135/46 (01/14 1202) SpO2:  [99 %-100 %] 99 % (01/14 1202)  Weight change:  Filed Weights   03/16/19 1400 03/18/19 0850 03/18/19 1206  Weight: 86.5 kg 88.4 kg 84.3 kg    Intake/Output: I/O last 3 completed shifts: In: 1858.8 [I.V.:180.5; NG/GT:1678.3] Out: 3400 [Other:3000; Stool:400]   Intake/Output this shift:  No intake/output data recorded.  Physical Exam: General: NAD, laying in bed  HEENT [place]/AT   Lungs:  clear  Heart: Regular rate and rhythm  Abdomen:  Soft, nontender, PEG tube in place  Extremities: 2+ upper and lower extremity edema, left arm dressings  Neurologic: Lethargic , opens eyes spontaneously.     [place]umbles responses to questions  Access: Left IJ PC      Basic Metabolic Panel: Recent Labs  Lab 03/14/19 0502 03/14/19 0502 03/15/19 0509 03/15/19 0509 03/16/19 [id number] 03/16/19 [id number] 03/17/19 0549 03/17/19 0549 03/18/19 0621 03/18/19 1001 03/19/19 0449  NA 138   < > 138   < > 136  --  137  --  137 136 136  K 2.8*   < > 3.8   < > 4.6  --  4.0  --  4.2 3.2* 4.1  CL 104   < > 101   < > 101  --  101  --  100 98 101  CO2 30   < > 26   < > 25  --  29  --  28 29 29   GLUCOSE 171*   < > 203*   < > 175*  --  163*  --  170* 156* 154*  BUN 21   < > 34*   < > 50*  --  33*  --  43* 24* 28*  CREATININE 1.90*   < > 2.70*   < > 3.44*  --  2.30*  --  2.99* 1.75* 2.16*  CALCIUM 8.1*   < > 8.1*   < > 7.9*   < > 7.9*   < > 8.1* 7.9* 7.9*  MG  --   --   --   --   --   --   --   --   --  1.7  --   PHOS 2.0*  --  4.0  --  4.9*  --   --   --   --  2.4*  --    < > = values in this  interval not displayed.    Liver Function Tests: Recent Labs  Lab 03/14/19 0502 03/15/19 0509 03/16/19 0832 03/18/19 1001  ALBUMIN 1.8* 1.8* 1.7* 1.8*   No results for input(s): LIPASE, AMYLASE in the last 168 hours. No results for input(s): AMMONIA in the last 168 hours.  CBC: Recent Labs  Lab 03/16/19 0832 03/17/19 0549 03/18/19 0621 03/18/19 1001 03/19/19 0633  WBC 7.9 6.4 5.7 5.6 4.6  HGB 7.2* 6.8* 8.2* 8.7* 8.7*  HCT 23.4* 22.2* 25.3* 26.9* 27.1*  MCV 101.3* 101.8* 95.8 95.4 96.8  PLT 218 198 220 203 228    Cardiac Enzymes: No results for input(s): CKTOTAL, CKMB, CKMBINDEX, TROPONINI in the last  168 hours.  BNP: Invalid input(s): POCBNP  CBG: Recent Labs  Lab 03/19/19 0101 03/19/19 0540 03/19/19 0734 03/19/19 1140 03/19/19 1625  GLUCAP 166* 152* 143* 31 202*    Microbiology: Results for orders placed or performed during the hospital encounter of 02/09/19  Culture, blood (routine x 2)     Status: Abnormal   Collection Time: 02/09/19  2:29 PM   Specimen: BLOOD LEFT ARM  Result Value Ref Range Status   Specimen Description   Final    BLOOD LEFT ARM Performed at [facility], [address], [place], [place] [address]    Special Requests   Final    BOTTLES DRAWN AEROBIC AND ANAEROBIC Blood Culture adequate volume Performed at [facility], [address], [place], [place] [address]    Culture  Setup Time   Final    GRAM POSITIVE COCCI IN BOTH AEROBIC AND ANAEROBIC BOTTLES CRITICAL VALUE NOTED.  VALUE IS CONSISTENT WITH PREVIOUSLY REPORTED AND CALLED VALUE. Performed at [facility], [address] [place], [place] [address]    Culture (A)  Final    STAPHYLOCOCCUS AUREUS SUSCEPTIBILITIES PERFORMED ON PREVIOUS CULTURE WITHIN THE LAST 5 DAYS. Performed at [facility], [place] [address], [place], [place] [address]    Report Status 02/12/2019 FINAL  Final  Culture, blood (routine x 2)     Status: Abnormal    Collection Time: 02/09/19  2:38 PM   Specimen: BLOOD LEFT ARM  Result Value Ref Range Status   Specimen Description   Final    BLOOD LEFT ARM Performed at [facility], [address] [place], [place] [address]    Special Requests   Final    BOTTLES DRAWN AEROBIC AND ANAEROBIC Blood Culture adequate volume Performed at [facility], [address], [place], [place] [address]    Culture  Setup Time   Final    GRAM POSITIVE COCCI IN BOTH AEROBIC AND ANAEROBIC BOTTLES CRITICAL RESULT CALLED TO, READ BACK BY AND VERIFIED WITH: Minford [id number] 02/10/2019 HNM Performed at [facility], [place] [address], [place],  [address]    Culture METHICILLIN RESISTANT STAPHYLOCOCCUS AUREUS (A)  Final   Report Status 02/12/2019 FINAL  Final   Organism ID, Bacteria METHICILLIN RESISTANT STAPHYLOCOCCUS AUREUS  Final      Susceptibility   Methicillin resistant staphylococcus aureus - MIC*    CIPROFLOXACIN >=8 RESISTANT Resistant     ERYTHROMYCIN >=8 RESISTANT Resistant     GENTAMICIN <=0.5 SENSITIVE Sensitive     OXACILLIN >=4 RESISTANT Resistant     TETRACYCLINE <=1 SENSITIVE Sensitive     VANCOMYCIN 1 SENSITIVE Sensitive     TRIMETH/SULFA <=10 SENSITIVE Sensitive     CLINDAMYCIN RESISTANT Resistant     RIFAMPIN <=0.5 SENSITIVE Sensitive     Inducible Clindamycin POSITIVE Resistant     * METHICILLIN RESISTANT STAPHYLOCOCCUS AUREUS  Blood Culture ID Panel (Reflexed)     Status: Abnormal   Collection Time: 02/09/19  2:38 PM  Result Value Ref Range Status   Enterococcus species NOT DETECTED NOT DETECTED Final   Listeria monocytogenes NOT DETECTED NOT DETECTED Final   Staphylococcus species DETECTED (A) NOT DETECTED Final    Comment: CRITICAL RESULT CALLED TO, READ BACK BY AND VERIFIED WITH: Hart Robinsons PHARMD [id number] 02/10/2019 HNM    Staphylococcus aureus (BCID) DETECTED (A) NOT DETECTED Final    Comment: Methicillin (oxacillin)-resistant Staphylococcus aureus  (MRSA). MRSA is predictably resistant to beta-lactam antibiotics (except  ceftaroline). Preferred therapy is vancomycin unless clinically contraindicated. Patient requires contact precautions if  hospitalized. CRITICAL RESULT CALLED TO, READ BACK BY AND VERIFIED WITH: Hart Robinsons PHARMD [id number] 02/10/2019 HNM    Methicillin resistance DETECTED (A) NOT DETECTED Final    Comment: CRITICAL RESULT CALLED TO, READ BACK BY AND VERIFIED WITH: Hart Robinsons PHARMD [id number] 02/10/2019 HNM    Streptococcus species NOT DETECTED NOT DETECTED Final   Streptococcus agalactiae NOT DETECTED NOT DETECTED Final   Streptococcus pneumoniae NOT DETECTED NOT DETECTED Final   Streptococcus pyogenes NOT DETECTED NOT DETECTED Final   Acinetobacter baumannii NOT DETECTED NOT DETECTED Final   Enterobacteriaceae species NOT DETECTED NOT DETECTED Final   Enterobacter cloacae complex NOT DETECTED NOT DETECTED Final   Escherichia coli NOT DETECTED NOT DETECTED Final   Klebsiella oxytoca NOT DETECTED NOT DETECTED Final   Klebsiella pneumoniae NOT DETECTED NOT DETECTED Final   Proteus species NOT DETECTED NOT DETECTED Final   Serratia marcescens NOT DETECTED NOT DETECTED Final   Haemophilus influenzae NOT DETECTED NOT DETECTED Final   Neisseria meningitidis NOT DETECTED NOT DETECTED Final   Pseudomonas aeruginosa NOT DETECTED NOT DETECTED Final   Candida albicans NOT DETECTED NOT DETECTED Final   Candida glabrata NOT DETECTED NOT DETECTED Final   Candida krusei NOT DETECTED NOT DETECTED Final   Candida parapsilosis NOT DETECTED NOT DETECTED Final   Candida tropicalis NOT DETECTED NOT DETECTED Final    Comment: Performed at [facility], [place], [place] [address]  SARS CORONAVIRUS 2 (TAT 6-24 HRS) Nasopharyngeal Nasopharyngeal Swab     Status: None   Collection Time: 02/09/19  2:54 PM   Specimen: Nasopharyngeal Swab  Result Value Ref Range Status   SARS Coronavirus 2 NEGATIVE NEGATIVE Final    Comment:  (NOTE) SARS-CoV-2 target nucleic acids are NOT DETECTED. The SARS-CoV-2 RNA is generally detectable in upper and lower respiratory specimens during the acute phase of infection. Negative results do not preclude SARS-CoV-2 infection, do not rule out co-infections with other pathogens, and should not be used as the sole basis for treatment or other patient management decisions. Negative results must be combined with clinical observations, patient history, and epidemiological information. The expected result is Negative. Fact Sheet for Patients: SugarRoll.be Fact Sheet for Healthcare Providers: https://www.woods-mathews.com/ This test is not yet approved or cleared by the [place] FDA and  has been authorized for detection and/or diagnosis of SARS-CoV-2 by FDA under an Emergency Use Authorization (EUA). This EUA will remain  in effect (meaning this test can be used) for the duration of the COVID-19 declaration under Section 56 4(b)(1) of the Act, 21 U.S.C. section 360bbb-3(b)(1), unless the authorization is terminated or revoked sooner. Performed at [facility], [place] [address] [place], [place] [address]   MRSA PCR Screening     Status: Abnormal   Collection Time: 02/10/19  5:03 PM   Specimen: Nasopharyngeal  Result Value Ref Range Status   MRSA by PCR POSITIVE (A) NEGATIVE Final    Comment:        The GeneXpert MRSA Assay (FDA approved for NASAL specimens only), is one component of a comprehensive MRSA colonization surveillance program. It is not intended to diagnose MRSA infection nor to guide or monitor treatment for MRSA infections. RESULT CALLED TO, READ BACK BY AND VERIFIED WITH: ALEKSEY FRASER @1839  02/10/19 MJU Performed at [facility], [address] [place], [place] [address]   Culture, blood (routine x 2)  Status: Abnormal   Collection Time: 02/11/19 12:31 PM   Specimen: BLOOD  Result Value Ref  Range Status   Specimen Description   Final    BLOOD PORTA CATH Performed at [facility], [address], [place], [place] [address]    Special Requests   Final    BOTTLES DRAWN AEROBIC AND ANAEROBIC Blood Culture adequate volume Performed at [facility], [address] [place], [place] [address]    Culture  Setup Time   Final    GRAM POSITIVE COCCI ANAEROBIC BOTTLE ONLY CRITICAL RESULT CALLED TO, READ BACK BY AND VERIFIED WITH: SCOTT HALL AT [id number] 02/12/2019 [facility]  Performed at [facility], [address] [place], [place] [address]    Culture (A)  Final    STAPHYLOCOCCUS AUREUS SUSCEPTIBILITIES PERFORMED ON PREVIOUS CULTURE WITHIN THE LAST 5 DAYS. Performed at [facility], [place] [address], [place], [place], [place] [address]    Report Status 02/13/2019 FINAL  Final  Culture, blood (routine x 2)     Status: None   Collection Time: 02/11/19  3:57 PM   Specimen: BLOOD  Result Value Ref Range Status   Specimen Description BLOOD PORTA CATH  Final   Special Requests   Final    BOTTLES DRAWN AEROBIC AND ANAEROBIC Blood Culture adequate volume   Culture   Final    NO GROWTH 5 DAYS Performed at [facility] [address] [place], [place] [address]    Report Status 02/16/2019 FINAL  Final  CULTURE, BLOOD (ROUTINE X 2) w Reflex to ID Panel     Status: Abnormal   Collection Time: 02/13/19  2:18 PM   Specimen: BLOOD LEFT HAND  Result Value Ref Range Status   Specimen Description BLOOD LEFT HAND  Final   Special Requests   Final    BOTTLES DRAWN AEROBIC AND ANAEROBIC Blood Culture results may not be optimal due to an inadequate volume of blood received in culture bottles   Culture  Setup Time   Final    AEROBIC BOTTLE ONLY GRAM POSITIVE COCCI IN CLUSTERS CRITICAL RESULT CALLED TO, READ BACK BY AND VERIFIED WITH: Herbert Pun AT [id number] ON 02/14/2019 [place]. Performed at [facility], [place] [address], [place],  [address]    Culture  METHICILLIN RESISTANT STAPHYLOCOCCUS AUREUS (A)  Final   Report Status 02/16/2019 FINAL  Final   Organism ID, Bacteria METHICILLIN RESISTANT STAPHYLOCOCCUS AUREUS  Final      Susceptibility   Methicillin resistant staphylococcus aureus - MIC*    CIPROFLOXACIN >=8 RESISTANT Resistant     ERYTHROMYCIN >=8 RESISTANT Resistant     GENTAMICIN <=0.5 SENSITIVE Sensitive     OXACILLIN >=4 RESISTANT Resistant     TETRACYCLINE <=1 SENSITIVE Sensitive     VANCOMYCIN <=0.5 SENSITIVE Sensitive     TRIMETH/SULFA <=10 SENSITIVE Sensitive     CLINDAMYCIN RESISTANT Resistant     RIFAMPIN <=0.5 SENSITIVE Sensitive     Inducible Clindamycin POSITIVE Resistant     * METHICILLIN RESISTANT STAPHYLOCOCCUS AUREUS  Blood Culture ID Panel (Reflexed)     Status: Abnormal   Collection Time: 02/13/19  2:18 PM  Result Value Ref Range Status   Enterococcus species NOT DETECTED NOT DETECTED Final   Listeria monocytogenes NOT DETECTED NOT DETECTED Final   Staphylococcus species DETECTED (A) NOT DETECTED Final    Comment: CRITICAL RESULT CALLED TO, READ BACK BY AND VERIFIED WITH: ABBEY ELLINGTON AT [id number] ON 02/14/2019 [place].  Staphylococcus aureus (BCID) DETECTED (A) NOT DETECTED Final    Comment: Methicillin (oxacillin)-resistant Staphylococcus aureus (MRSA). MRSA is predictably resistant to beta-lactam antibiotics (except ceftaroline). Preferred therapy is vancomycin unless clinically contraindicated. Patient requires contact precautions if  hospitalized. CRITICAL RESULT CALLED TO, READ BACK BY AND VERIFIED WITH: Herbert Pun AT [id number] ON 02/14/2019 [place].    Methicillin resistance DETECTED (A) NOT DETECTED Final    Comment: CRITICAL RESULT CALLED TO, READ BACK BY AND VERIFIED WITH: Herbert Pun AT [id number] ON 02/14/2019 [place].    Streptococcus species NOT DETECTED NOT DETECTED Final   Streptococcus agalactiae NOT DETECTED NOT DETECTED Final   Streptococcus pneumoniae NOT DETECTED NOT DETECTED Final   Streptococcus  pyogenes NOT DETECTED NOT DETECTED Final   Acinetobacter baumannii NOT DETECTED NOT DETECTED Final   Enterobacteriaceae species NOT DETECTED NOT DETECTED Final   Enterobacter cloacae complex NOT DETECTED NOT DETECTED Final   Escherichia coli NOT DETECTED NOT DETECTED Final   Klebsiella oxytoca NOT DETECTED NOT DETECTED Final   Klebsiella pneumoniae NOT DETECTED NOT DETECTED Final   Proteus species NOT DETECTED NOT DETECTED Final   Serratia marcescens NOT DETECTED NOT DETECTED Final   Haemophilus influenzae NOT DETECTED NOT DETECTED Final   Neisseria meningitidis NOT DETECTED NOT DETECTED Final   Pseudomonas aeruginosa NOT DETECTED NOT DETECTED Final   Candida albicans NOT DETECTED NOT DETECTED Final   Candida glabrata NOT DETECTED NOT DETECTED Final   Candida krusei NOT DETECTED NOT DETECTED Final   Candida parapsilosis NOT DETECTED NOT DETECTED Final   Candida tropicalis NOT DETECTED NOT DETECTED Final    Comment: Performed at [facility], [address] [place], [place] [address]  CULTURE, BLOOD (ROUTINE X 2) w Reflex to ID Panel     Status: None   Collection Time: 02/13/19  3:09 PM   Specimen: BLOOD  Result Value Ref Range Status   Specimen Description BLOOD BLOOD LEFT HAND  Final   Special Requests   Final    BOTTLES DRAWN AEROBIC AND ANAEROBIC Blood Culture adequate volume   Culture   Final    NO GROWTH 5 DAYS Performed at [facility], [address] [place], [place] [address]    Report Status 02/18/2019 FINAL  Final  Culture, blood (routine x 2)     Status: Abnormal   Collection Time: 02/15/19 12:49 PM   Specimen: BLOOD  Result Value Ref Range Status   Specimen Description   Final    BLOOD LT HAND Performed at [facility], [address] [place], [place] [address]    Special Requests   Final    BOTTLES DRAWN AEROBIC AND ANAEROBIC Blood Culture results may not be optimal due to an inadequate volume of blood received in culture  bottles Performed at [facility], [address] [place], [place] [address]    Culture  Setup Time   Final    GRAM POSITIVE COCCI IN BOTH AEROBIC AND ANAEROBIC BOTTLES CRITICAL RESULT CALLED TO, READ BACK BY AND VERIFIED WITHEleonore Chiquito AT [id number] 02/16/2019 SDR Performed at [facility], [address] [place], [place] [address]    Culture (A)  Final    METHICILLIN RESISTANT STAPHYLOCOCCUS AUREUS STAPHYLOCOCCUS SPECIES (COAGULASE NEGATIVE) THE SIGNIFICANCE OF ISOLATING THIS ORGANISM FROM A SINGLE SET OF BLOOD CULTURES WHEN MULTIPLE SETS ARE DRAWN IS UNCERTAIN. PLEASE NOTIFY THE MICROBIOLOGY DEPARTMENT WITHIN ONE WEEK IF SPECIATION AND SENSITIVITIES ARE REQUIRED. Performed at [facility], [place] [address] [place], [place]  [id number]    Report Status 02/18/2019 FINAL  Final   Organism ID, Bacteria METHICILLIN RESISTANT STAPHYLOCOCCUS AUREUS  Final      Susceptibility   Methicillin resistant staphylococcus aureus - MIC*    CIPROFLOXACIN >=8 RESISTANT Resistant     ERYTHROMYCIN >=8 RESISTANT Resistant     GENTAMICIN <=0.5 SENSITIVE Sensitive     OXACILLIN >=4 RESISTANT Resistant     TETRACYCLINE <=1 SENSITIVE Sensitive     VANCOMYCIN <=0.5 SENSITIVE Sensitive     TRIMETH/SULFA <=10 SENSITIVE Sensitive     CLINDAMYCIN RESISTANT Resistant     RIFAMPIN <=0.5 SENSITIVE Sensitive     Inducible Clindamycin POSITIVE Resistant     * METHICILLIN RESISTANT STAPHYLOCOCCUS AUREUS  Aerobic Culture (superficial specimen)     Status: None   Collection Time: 02/17/19  1:31 PM   Specimen: Heel; Wound  Result Value Ref Range Status   Specimen Description   Final    HEEL Performed at [facility], [address], [place], [place] [address]    Special Requests   Final    NONE Performed at [facility], [address] [place], [place] [address]    Gram Stain   Final    NO WBC SEEN MODERATE GRAM POSITIVE COCCI IN PAIRS    Culture   Final     ABUNDANT METHICILLIN RESISTANT STAPHYLOCOCCUS AUREUS ABUNDANT DIPHTHEROIDS(CORYNEBACTERIUM SPECIES) Standardized susceptibility testing for this organism is not available. Performed at [facility], [place] [address] [place], [place] [address]    Report Status 02/19/2019 FINAL  Final   Organism ID, Bacteria METHICILLIN RESISTANT STAPHYLOCOCCUS AUREUS  Final      Susceptibility   Methicillin resistant staphylococcus aureus - MIC*    CIPROFLOXACIN >=8 RESISTANT Resistant     ERYTHROMYCIN >=8 RESISTANT Resistant     GENTAMICIN <=0.5 SENSITIVE Sensitive     OXACILLIN >=4 RESISTANT Resistant     TETRACYCLINE <=1 SENSITIVE Sensitive     VANCOMYCIN 1 SENSITIVE Sensitive     TRIMETH/SULFA <=10 SENSITIVE Sensitive     CLINDAMYCIN RESISTANT Resistant     RIFAMPIN <=0.5 SENSITIVE Sensitive     Inducible Clindamycin POSITIVE Resistant     * ABUNDANT METHICILLIN RESISTANT STAPHYLOCOCCUS AUREUS  CULTURE, BLOOD (ROUTINE X 2) w Reflex to ID Panel     Status: None   Collection Time: 02/18/19 11:29 PM   Specimen: BLOOD  Result Value Ref Range Status   Specimen Description BLOOD LEFT ARM  Final   Special Requests   Final    BOTTLES DRAWN AEROBIC AND ANAEROBIC Blood Culture adequate volume   Culture   Final    NO GROWTH 5 DAYS Performed at [facility], [address] [place],  [address]    Report Status 02/23/2019 FINAL  Final  CULTURE, BLOOD (ROUTINE X 2) w Reflex to ID Panel     Status: None   Collection Time: 02/18/19 11:29 PM   Specimen: BLOOD  Result Value Ref Range Status   Specimen Description BLOOD LEFT ARM  Final   Special Requests   Final    BOTTLES DRAWN AEROBIC AND ANAEROBIC Blood Culture adequate volume   Culture   Final    NO GROWTH 5 DAYS Performed at [facility], [address], [place],  [address]    Report Status 02/23/2019 FINAL  Final  Aerobic/Anaerobic Culture (surgical/deep wound)     Status: None   Collection Time: 03/04/19  11:04 AM   Specimen: PATH Other; Tissue  Result Value Ref Range Status   Specimen Description   Final    ARTERY Performed at [facility], [address] [place], [place] [address]    Special Requests   Final    RIGHT AV GRAFT AND STENTS CULTURE Performed at [facility], [address] [place], [place] [address]    Gram Stain   Final    RARE WBC PRESENT, PREDOMINANTLY PMN NO ORGANISMS SEEN    Culture   Final    No growth aerobically or anaerobically. Performed at [facility], [place] [address], [place], [place] [address]    Report Status 03/09/2019 FINAL  Final  Aerobic/Anaerobic Culture (surgical/deep wound)     Status: None   Collection Time: 03/04/19 11:29 AM   Specimen: PATH Other; Tissue  Result Value Ref Range Status   Specimen Description   Final    ARTERY Performed at [facility], [address], [place], [place] [address]    Special Requests   Final    FLUID AROUND AV GRAFT Performed at [facility], [address] [place], [place] [address]    Gram Stain   Final    FEW WBC PRESENT, PREDOMINANTLY PMN NO ORGANISMS SEEN    Culture   Final    No growth aerobically or anaerobically. Performed at [facility], [place] [address] [place], [place] [address]    Report Status 03/09/2019 FINAL  Final  Wet prep, genital     Status: Abnormal   Collection Time: 03/16/19 10:01 AM  Result Value Ref Range Status   Yeast Wet Prep HPF POC PRESENT (A) NONE SEEN Final   Trich, Wet Prep NONE SEEN NONE SEEN Final   Clue Cells Wet Prep HPF POC NONE SEEN NONE SEEN Final   WBC, Wet Prep HPF POC MANY (A) NONE SEEN Final   Sperm NONE SEEN  Final    Comment: Performed at [facility], [address], [place], [place] [address]  Aerobic Culture (superficial specimen)     Status: None (Preliminary result)   Collection Time: 03/17/19  5:13 PM   Specimen: Vaginal Fluid  Result Value Ref Range Status   Specimen Description    Final    VAGINA Performed at [facility], [address] [place], [place] [address]    Special Requests   Final    NONE Performed at [facility], [address] [place], [place] [address]    Gram Stain   Final    MODERATE WBC PRESENT,BOTH PMN AND MONONUCLEAR FEW GRAM POSITIVE COCCI IN PAIRS FEW GRAM NEGATIVE RODS    Culture   Final    ABUNDANT ESCHERICHIA COLI MODERATE KLEBSIELLA PNEUMONIAE SUSCEPTIBILITIES TO FOLLOW Performed at [facility], [place] [address] [place], [place] [address]    Report Status PENDING  Incomplete    Coagulation Studies: No results for input(s): LABPROT, INR in the last 72 hours.  Urinalysis: No results for input(s): COLORURINE, LABSPEC, PHURINE, GLUCOSEU, HGBUR, BILIRUBINUR, KETONESUR, PROTEINUR, UROBILINOGEN, NITRITE, LEUKOCYTESUR in the last 72 hours.  Invalid input(s): APPERANCEUR    Imaging: No results found.   Medications:   . sodium chloride 250 mL (03/18/19 1635)  . feeding supplement (OSMOLITE 1.5 CAL) 1,000 mL (03/19/19 1120)  . heparin 700 Units/hr (03/19/19 0457)  . vancomycin 750 mg (03/18/19 1636)   . aspirin  81 mg Per Tube Daily  . B-complex with vitamin C  1 tablet Per Tube Daily  . Chlorhexidine  Gluconate Cloth  6 each Topical [id number]  . clotrimazole  1 Applicatorful Vaginal QHS  . epoetin (EPOGEN/PROCRIT) injection  10,000 Units Intravenous Q M,W,F-HD  . feeding supplement (PRO-STAT SUGAR FREE 64)  30 mL Per Tube Daily  . fluticasone  2 spray Each Nare Daily  . free water  20 mL Per Tube Q4H  . insulin aspart  0-15 Units Subcutaneous Q4H  . levothyroxine  100 mcg Per Tube QAC breakfast  . mouth rinse  15 mL Mouth Rinse BID  . pantoprazole (PROTONIX) IV  40 mg Intravenous Q12H  . psyllium  1 packet Per Tube TID  . venlafaxine  75 mg Per Tube BID WC  . zinc sulfate  220 mg Per Tube Daily   sodium chloride, acetaminophen, ipratropium-albuterol, ondansetron (ZOFRAN) IV,  traMADol  Assessment/ Plan:  Jamie Arias is a 64 y.o. white female with end-stage renal disease on hemodialysis, atrial fibrillation, chronic diastolic congestive heart failure, coronary artery disease status post CABG, diabetes mellitus type II, hypertension, hyperlipidemia, hypothyroidism, Covid positive in July 2020, recent nursing home stay in [place] Prolonged hospitalization for MRSA bacteremia/sepsis and now with acute encephalopathy  [facility] MWF [facility] [address] Left AVG 81.5kg  1. End Stage Renal Disease: on hemodialysis. With complication of dialysis device. Right arm AVG removed 12/30 due to suspected source of bacteremia Now with left IJ permcath - Continue MWF schedule.   - albumin with HD treatments.    2. Hypotension -Continue midodrine 10 mg 3 times daily.  3. Anemia of chronic kidney disease:   EPO with HD treatments.   4. Secondary Hyperparathyroidism: phosphorus and calcium within goal.   5. Acute Encephalopathy: No significant change in mental status.   - Appreciate Neurology input. Showed some improvement between both EEGs - Thiamine - Subjective improvements since beginning of the week.    LOS: [place] 1/14/20215:31 PM

## 2019-03-19 NOTE — Progress Notes (Signed)
L Femoral TL with Pink area at both stitches. Blue port with good flush, No blood return. White port, no flush, No blood return. Notified Firefighter of current status and Length of time device has been present. Awaiting further orders.

## 2019-03-19 NOTE — Progress Notes (Signed)
PROGRESS NOTE    Jamie Arias  [id number] DOB: May 16, 1955 DOA: 02/09/2019 PCP: [name], MD   Brief Narrative:  Jamie Arias is a 64 y.o. white female with end-stage renal disease on hemodialysis, atrial fibrillation, chronic diastolic congestive heart failure, coronary artery disease status post CABG, diabetes mellitus type II, hypertension, hyperlipidemia, hypothyroidism, Covid positive in July 2020, recent nursing home stay in [employer] [place] [employer] [place]. Prolonged hospitalization for MRSA bacteremia/sepsis and now with acute encephalopathy.  Subjective: Appears in altered level of consciousness.  No other acute issues.  Awaiting lumbar puncture.  Assessment & Plan:   Principal Problem:   Sepsis due to methicillin resistant Staphylococcus aureus (MRSA) ([place]) Active Problems:   Type 2 diabetes mellitus with ESRD (end-stage renal disease) (HCC)   Atrial fibrillation, chronic (HCC)   Hypotension   Chronic diastolic CHF (congestive heart failure) (HCC)   ESRD (end stage renal disease) (HCC)   Altered level of consciousness   Decubitus ulcer of heel, bilateral, stage 2 (HCC)   MRSA bacteremia   Arm DVT (deep venous thromboembolism), acute, left (HCC)   Acute encephalopathy   Acute metabolic encephalopathy   Acute blood loss anemia   AF (paroxysmal atrial fibrillation) ([place])   Diarrhea   Oropharyngeal dysphagia   Abscess  Sepsis secondary to MRSA bacteremia.  No obvious source.  Right arm graft was removed on 03/04/2019. -Continue with vancomycin for 6-week, will finish her course on 04/01/2019.  Acute metabolic encephalopathy:  mental status remains unchanged -  MRI of brain and EEG negative. Patient remained afebrile with no leukocytosis.  B12 within normal range, folate and B1 results are pending.  -  She was started on thiamine challenge by neurology but not much improvement. - Because of persistent encephalopathy - Neuro recommends LP   - she needs to be off Eliquis for  48 hrs and off of heparin; IR to perform LP on 03/20/19 AM. Heparin to be stopped/ held from MN tonight  - Cont heparin drip in the interim - Repeat TSH was elevated. - Increased dose of Synthroid to 100 mcg -Overall prognosis seems poor.  -Palliative care consulted.  They have been following the patient and in touch with husband.  In the meantime continue medical treatment.  Bleeding from catheter site with multiple ecchymosis.  No new episode over the past 24 to 48 hours -  warfarin was discontinued due to concern for necrosis of skin.  Hemoglobin dropped to 6.8; improved to 8.7 and remains mostly above 8 -Holding Eliquis & switch over to Heparin drip   Vaginal discharge.  There was some nursing concern of vaginal discharge  -there is some yeast but Gyn feels this may be colonization - may not need treatment. She is not symptomatic  Anemia.  Status post transfusion 1 unit of PRBC on 03/17/2019 -  hemoglobin stable above 8.  Iron studies consistent with anemia of chronic illness.  MCV elevated.  -B12 within normal range -Continue EPO with dialysis.  Hypotension.  Resolved now  ESRD (M WF).   Currently using tunneled catheter as graft was removed due to MRSA bacteremia. -Continue with scheduled dialysis.  Left arm DVT.  Patient was on Heparin.  Recent diagnosis of left arm DVT on 03/03/2019. -Discontinue Coumadin due to concern for Coumadin toxicity/side effect due to her skin necrotic lesions. -holding Eliquis for LP and switched over to Heparin drip  Hypothyroidism.  Repeat TSH elevated at 17.82. -Increased the dose of Synthroid to 100. - repeat TSH  in 2 to 3 weeks.  Paroxysmal A. Fib.. Currently in sinus. Amiodarone was discontinued. -Holding Eliquis for LP and started heparin drip  Type 2 diabetes. -Continue SSI.  Chronic diastolic heart failure.  Volume being managed by dialysis.  Bilateral decubitus ulcers.  Present on admission. -Continue with wound care.  Nutrition.   PEG placed 03/03/2019.  Continue tube feeding.  Acute hypoxic respiratory failure.  Resolved.  Patient was intubated initially, extubated on 03/05/2019. -On room air now  Objective: Vitals:   03/18/19 1204 03/18/19 1206 03/18/19 2023 03/19/19 0541  BP: (!) 87/36 (!) 130/51 (!) 115/55 (!) 130/99  Pulse: 83 83 79 81  Resp: 14 14 20 20   Temp:  (!) 97.4 F (36.3 C) 99 F (37.2 C) 98.9 F (37.2 C)  TempSrc:  Oral Oral Oral  SpO2: 91% 100% 100% 100%  Weight:  84.3 kg    Height:        Intake/Output Summary (Last 24 hours) at 03/19/2019 1155 Last data filed at 03/19/2019 [id number] Gross per 24 hour  Intake 1858.81 ml  Output 3400 ml  Net -1541.19 ml   Filed Weights   03/16/19 1400 03/18/19 0850 03/18/19 1206  Weight: 86.5 kg 88.4 kg 84.3 kg    Examination:  General exam: Chronically ill-appearing lady, minimal response to painful stimuli, in no acute distress. Respiratory system: Clear to auscultation. Respiratory effort normal. Cardiovascular system: S1 & S2 heard, RRR. No JVD, murmurs, rubs, gallops or clicks. Gastrointestinal system: Soft, nontender, nondistended, bowel sounds positive. Central nervous system: Unable to assess as patient is minimally responsive and not following any commands. Extremities: Bilateral upper extremity edema, no cyanosis, pulses intact and symmetrical. Skin: Decubitus ulcers on both heels.  Multiple ecchymosis on all extremities and upper chest, appears healing now. Psychiatry: Judgement and insight appear impaired.  DVT prophylaxis: Eliquis Code Status: DNR Family Communication: Husband was updated at bedside. Disposition Plan: [facility]ing for [facility] at [facility]  Consultants:   Nephrology  Infectious disease  Vascular surgery  Neurology  Critical care specialist   Procedures:  Antimicrobials:  Vancomycin.  Data Reviewed: I have personally reviewed following labs and imaging studies  CBC: Recent Labs  Lab 03/16/19 0832  03/17/19 0549 03/18/19 0621 03/18/19 1001 03/19/19 0633  WBC 7.9 6.4 5.7 5.6 4.6  HGB 7.2* 6.8* 8.2* 8.7* 8.7*  HCT 23.4* 22.2* 25.3* 26.9* 27.1*  MCV 101.3* 101.8* 95.8 95.4 96.8  PLT 218 198 220 203 [id number]   Basic Metabolic Panel: Recent Labs  Lab 03/14/19 0502 03/14/19 0502 03/15/19 0509 03/15/19 0509 03/16/19 0832 03/17/19 0549 03/18/19 0621 03/18/19 1001 03/19/19 0449  NA 138   < > 138   < > 136 137 137 136 136  K 2.8*   < > 3.8   < > 4.6 4.0 4.2 3.2* 4.1  CL 104   < > 101   < > 101 101 100 98 101  CO2 30   < > 26   < > 25 29 28 29 29   GLUCOSE 171*   < > 203*   < > 175* 163* 170* 156* 154*  BUN 21   < > 34*   < > 50* 33* 43* 24* 28*  CREATININE 1.90*   < > 2.70*   < > 3.44* 2.30* 2.99* 1.75* 2.16*  CALCIUM 8.1*   < > 8.1*   < > 7.9* 7.9* 8.1* 7.9* 7.9*  MG  --   --   --   --   --   --   --  1.7  --   PHOS 2.0*  --  4.0  --  4.9*  --   --  2.4*  --    < > = values in this interval not displayed.   GFR: Estimated Creatinine Clearance: 27.3 mL/min (A) (by C-G formula based on SCr of 2.16 mg/dL (H)). Liver Function Tests: Recent Labs  Lab 03/14/19 0502 03/15/19 0509 03/16/19 0832 03/18/19 1001  ALBUMIN 1.8* 1.8* 1.7* 1.8*   No results for input(s): LIPASE, AMYLASE in the last 168 hours. No results for input(s): AMMONIA in the last 168 hours. Coagulation Profile: Recent Labs  Lab 03/13/19 0618  INR 1.4*   Cardiac Enzymes: No results for input(s): CKTOTAL, CKMB, CKMBINDEX, TROPONINI in the last 168 hours. BNP (last 3 results) No results for input(s): PROBNP in the last 8760 hours. HbA1C: No results for input(s): HGBA1C in the last 72 hours. CBG: Recent Labs  Lab 03/18/19 2022 03/19/19 0101 03/19/19 0540 03/19/19 0734 03/19/19 1140  GLUCAP 142* 166* 152* 143* 83   Lipid Profile: No results for input(s): CHOL, HDL, LDLCALC, TRIG, CHOLHDL, LDLDIRECT in the last 72 hours. Thyroid Function Tests: No results for input(s): TSH, T4TOTAL, FREET4, T3FREE,  THYROIDAB in the last 72 hours. Anemia Panel: Recent Labs    03/19/19 0633  FOLATE 4.5*   Sepsis Labs: No results for input(s): PROCALCITON, LATICACIDVEN in the last 168 hours.  Recent Results (from the past 240 hour(s))  Wet prep, genital     Status: Abnormal   Collection Time: 03/16/19 10:01 AM  Result Value Ref Range Status   Yeast Wet Prep HPF POC PRESENT (A) NONE SEEN Final   Trich, Wet Prep NONE SEEN NONE SEEN Final   Clue Cells Wet Prep HPF POC NONE SEEN NONE SEEN Final   WBC, Wet Prep HPF POC MANY (A) NONE SEEN Final   Sperm NONE SEEN  Final    Comment: Performed at [facility], [address], [place], [place] [address]  Aerobic Culture (superficial specimen)     Status: None (Preliminary result)   Collection Time: 03/17/19  5:13 PM   Specimen: Vaginal Fluid  Result Value Ref Range Status   Specimen Description   Final    VAGINA Performed at [facility] [address] [place], [place] [address]    Special Requests   Final    NONE Performed at [facility], [address] [place], [place] [address]    Gram Stain   Final    MODERATE WBC PRESENT,BOTH PMN AND MONONUCLEAR FEW GRAM POSITIVE COCCI IN PAIRS FEW GRAM NEGATIVE RODS    Culture   Final    ABUNDANT GRAM NEGATIVE RODS CULTURE REINCUBATED FOR BETTER GROWTH Performed at [facility], [place] [address], [place], [place] [address]    Report Status PENDING  Incomplete     Radiology Studies: No results found.  Scheduled Meds: . aspirin  81 mg Per Tube Daily  . B-complex with vitamin C  1 tablet Per Tube Daily  . Chlorhexidine Gluconate Cloth  6 each Topical Q0600  . clotrimazole  1 Applicatorful Vaginal QHS  . epoetin (EPOGEN/PROCRIT) injection  10,000 Units Intravenous Q M,W,F-HD  . feeding supplement (PRO-STAT SUGAR FREE 64)  30 mL Per Tube Daily  . fluticasone  2 spray Each Nare Daily  . free water  20 mL Per Tube Q4H  . insulin aspart  0-15 Units Subcutaneous  Q4H  . levothyroxine  100 mcg Per Tube QAC breakfast  .  mouth rinse  15 mL Mouth Rinse BID  . pantoprazole (PROTONIX) IV  40 mg Intravenous Q12H  . psyllium  1 packet Per Tube TID  . venlafaxine  75 mg Per Tube BID WC  . zinc sulfate  220 mg Per Tube Daily   Continuous Infusions: . sodium chloride 250 mL (03/18/19 1635)  . feeding supplement (OSMOLITE 1.5 CAL) 1,000 mL (03/19/19 1120)  . heparin 700 Units/hr (03/19/19 0457)  . vancomycin 750 mg (03/18/19 1636)     LOS: 38 days   Time spent: 35 min.   Thornell Mule, MD [facility] Pager [phone]  If 7PM-7AM, please contact night-coverage [email or web address] Password [facility] 03/19/2019, 11:55 AM   This record has been created using Systems analyst. Errors have been sought and corrected,but may not always be located. Such creation errors do not reflect on the standard of care.

## 2019-03-19 NOTE — Consult Note (Signed)
ANTICOAGULATION CONSULT NOTE  Pharmacy Consult for heparin Indication: DVT  Patient Measurements: Height: 5' 4.02" (162.6 cm) Weight: 185 lb 13.6 oz (84.3 kg) IBW/kg (Calculated) : 54.74 Heparin Dosing Weight: 71 kg  Vital Signs: Temp: 98.9 F (37.2 C) (01/14 0541) Temp Source: Oral (01/14 0541) BP: 130/99 (01/14 0541) Pulse Rate: 81 (01/14 0541)  Labs: Recent Labs    03/17/19 0549 03/17/19 0549 03/17/19 1919 03/18/19 0621 03/18/19 0621 03/18/19 1001 03/18/19 2217 03/19/19 0633  HGB 6.8*   < >  --  8.2*   < > 8.7*  --  8.7*  HCT 22.2*   < >  --  25.3*  --  26.9*  --  27.1*  PLT 198   < >  --  220  --  203  --  228  APTT  --    < > 59* 126*  --   --  41* 100*  HEPARINUNFRC  --   --  2.88* 2.16*  --   --   --  0.90*  CREATININE 2.30*  --   --  2.99*  --  1.75*  --   --    < > = values in this interval not displayed.   Estimated Creatinine Clearance: 33.6 mL/min (A) (by C-G formula based on SCr of 1.75 mg/dL (H)).  Medical History: Past Medical History:  Diagnosis Date  . Anemia   . Chronic diastolic CHF (congestive heart failure) ([place])    a. Due to ischemic cardiomyopathy. EF as low as 35%, improved to normal s/p CABG; b. echo 07/06/13: EF 55-60%, no RWMAs, mod TR, trivial pericardial effusion not c/w tamponade physiology;  c. 10/2015 Echo: EF 65%, Gr1 DD, triv AI, mild MR, mildly dil LA, mod TR, PASP 75mmHg.  Marland Kitchen Coronary artery disease    a. NSTEMI 06/2013; b.cath: severe three-vessel CAD w/ EF 30% & mild-mod MR; c. s/p 3 vessel CABG 07/02/13 (LIMA-LAD, SVG-OM, and SVG-RPDA);  d. 10/2015 MV: no ischemia/infarct.  . Diabetes mellitus without complication ([place])   . Diabetic neuropathy ([place])   . Dialysis patient [facility])    MWF  . ESRD (end stage renal disease) ([place])    a. 12/2015 initiated - mwf dialysis.  Marland Kitchen GERD (gastroesophageal reflux disease)   . Hyperlipidemia   . Hypertension   . Hypothyroidism   . Myocardial infarction ([place]) 2015  . Neuropathy   . Pleural effusion  2015  . Pulmonary hypertension ([place])   . Renal insufficiency   . Wears dentures    full lower    Medications:  Scheduled:  . aspirin  81 mg Per Tube Daily  . B-complex with vitamin C  1 tablet Per Tube Daily  . Chlorhexidine Gluconate Cloth  6 each Topical Q0600  . clotrimazole  1 Applicatorful Vaginal QHS  . epoetin (EPOGEN/PROCRIT) injection  10,000 Units Intravenous Q M,W,F-HD  . feeding supplement (PRO-STAT SUGAR FREE 64)  30 mL Per Tube Daily  . fluticasone  2 spray Each Nare Daily  . free water  20 mL Per Tube Q4H  . insulin aspart  0-15 Units Subcutaneous Q4H  . levothyroxine  100 mcg Per Tube QAC breakfast  . mouth rinse  15 mL Mouth Rinse BID  . pantoprazole (PROTONIX) IV  40 mg Intravenous Q12H  . psyllium  1 packet Per Tube TID  . venlafaxine  75 mg Per Tube BID WC  . zinc sulfate  220 mg Per Tube Daily    Assessment: 64 y.o. female admitted on  02/09/2019 with MRSA  bacteremia. She was on apixaban PTA but last dose was prior to admission (12/7). [place] 12/15 revealed age-indeterminate short segment occlusive DVT involving the mid humeral aspect of one of the paired brachial veins. On 12/20 a femoral central line was placed. She was on heparin until 03/13/18 when she was changed to apixaban. However, an LP is being ordered in the setting of continued confusion and she is being transitioned to heparin until the procedure is performed. Her last dose of apixaban was 0928 03/17/19. H&H trending down, requiring intermittent transfusions, although there is no active bleeding noted (appears to have stabilized),  PLT wnl, baseline aPTT 59s and heparin level 2.88 U/mL  Goal of Therapy:  Heparin Level: 0.3 - 0.7 units/mL aPTT 66 - 102s Monitor platelets by anticoagulation protocol: Yes   Heparin Course: 1/12 initiation: 800 units/hr 1/13 0648 aPTT 126s, HL 2.16: reduce to 650 units/hr 1/13 2200 aPTT 41s: inc rate to 700 units/hr 1/14 0633 aPTT 100s, HL 0.90 IU/mL: no change  Plan:    aPTT therapeutic: continue at 700 units/hr  recheck aPTT at 1400  aPTT and heparin level still not correlating: continue to use aPTT to guide infusion rate  Repeat heparin level and CBC in am  Vallery Sa, PharmD, BCPS Clinical Pharmacist 03/19/2019 7:53 AM

## 2019-03-19 NOTE — Consult Note (Signed)
ANTICOAGULATION CONSULT NOTE  Pharmacy Consult for heparin Indication: DVT  Patient Measurements: Height: 5' 4.02" (162.6 cm) Weight: 185 lb 13.6 oz (84.3 kg) IBW/kg (Calculated) : 54.74 Heparin Dosing Weight: 71 kg  Vital Signs: Temp: 99 F (37.2 C) (01/13 2023) Temp Source: Oral (01/13 2023) BP: 115/55 (01/13 2023) Pulse Rate: 79 (01/13 2023)  Labs: Recent Labs    03/17/19 0549 03/17/19 1919 03/18/19 0621 03/18/19 1001 03/18/19 2217  HGB 6.8*  --  8.2* 8.7*  --   HCT 22.2*  --  25.3* 26.9*  --   PLT 198  --  220 203  --   APTT  --  59* 126*  --  41*  HEPARINUNFRC  --  2.88* 2.16*  --   --   CREATININE 2.30*  --  2.99* 1.75*  --    Estimated Creatinine Clearance: 33.6 mL/min (A) (by C-G formula based on SCr of 1.75 mg/dL (H)).  Medical History: Past Medical History:  Diagnosis Date  . Anemia   . Chronic diastolic CHF (congestive heart failure) ([place])    a. Due to ischemic cardiomyopathy. EF as low as 35%, improved to normal s/p CABG; b. echo 07/06/13: EF 55-60%, no RWMAs, mod TR, trivial pericardial effusion not c/w tamponade physiology;  c. 10/2015 Echo: EF 65%, Gr1 DD, triv AI, mild MR, mildly dil LA, mod TR, PASP 53mmHg.  Marland Kitchen Coronary artery disease    a. NSTEMI 06/2013; b.cath: severe three-vessel CAD w/ EF 30% & mild-mod MR; c. s/p 3 vessel CABG 07/02/13 (LIMA-LAD, SVG-OM, and SVG-RPDA);  d. 10/2015 MV: no ischemia/infarct.  . Diabetes mellitus without complication ([place])   . Diabetic neuropathy ([place])   . Dialysis patient [facility])    MWF  . ESRD (end stage renal disease) ([place])    a. 12/2015 initiated - mwf dialysis.  Marland Kitchen GERD (gastroesophageal reflux disease)   . Hyperlipidemia   . Hypertension   . Hypothyroidism   . Myocardial infarction ([place]) 2015  . Neuropathy   . Pleural effusion 2015  . Pulmonary hypertension ([place])   . Renal insufficiency   . Wears dentures    full lower    Medications:  Scheduled:  . aspirin  81 mg Per Tube Daily  . B-complex with vitamin  C  1 tablet Per Tube Daily  . Chlorhexidine Gluconate Cloth  6 each Topical Q0600  . clotrimazole  1 Applicatorful Vaginal QHS  . epoetin (EPOGEN/PROCRIT) injection  10,000 Units Intravenous Q M,W,F-HD  . feeding supplement (PRO-STAT SUGAR FREE 64)  30 mL Per Tube Daily  . fluticasone  2 spray Each Nare Daily  . free water  20 mL Per Tube Q4H  . insulin aspart  0-15 Units Subcutaneous Q4H  . levothyroxine  100 mcg Per Tube QAC breakfast  . mouth rinse  15 mL Mouth Rinse BID  . pantoprazole (PROTONIX) IV  40 mg Intravenous Q12H  . psyllium  1 packet Per Tube TID  . venlafaxine  75 mg Per Tube BID WC  . zinc sulfate  220 mg Per Tube Daily    Assessment: 64 y.o. female admitted on 02/09/2019 with MRSA  bacteremia. She was on apixaban PTA but last dose was prior to admission (12/7). [place] 12/15 revealed age-indeterminate short segment occlusive DVT involving the mid humeral aspect of one of the paired brachial veins. On 12/20 a femoral central line was placed. She was on heparin until 03/13/18 when she was changed to apixaban. However, an LP is being ordered in the  setting of continued confusion and she is being transitioned to heparin until the procedure is performed. Her last dose of apixaban was 0928 03/17/19. H&H trending down, requiring intermittent transfusions, although there is no active bleeding noted (noted improvement today),  PLT wnl, baseline aPTT 59s and heparin level 2.88 U/mL  Goal of Therapy:  Heparin Level: 0.3 - 0.7 units/mL aPTT 66 - 102s Monitor platelets by anticoagulation protocol: Yes   Heparin Course: 1/12 initiation: 800 units/hr 1/13 0648 aPTT  126s, HL 2.16: reduce to 650 units/hr  Plan:  01/13 @ 2200 aPTT 41 seconds subtherapeutic. Will increase rate slightly to 700 units/hr and will recheck aPTT @ 0600, patient has anemia of chronic disease s/t ESRD on HD. Will continue to monitor.  Tobie Lords, PharmD, BCPS Clinical Pharmacist 03/19/2019 12:16 AM

## 2019-03-19 NOTE — TOC Progression Note (Signed)
Transition of Care [facility]) - Progression Note    Patient Details  Name: Jamie Arias MRN: [id number] Date of Birth: Feb 23, 1956  Transition of Care [facility] CM/SW Contact  Shelbie Ammons, RN Phone Number: 03/19/2019, 3:38 PM  Clinical Narrative:   RNCM placed call to Lauren with [facility] to discuss possible placement and get update as to any bed offers. Lauren reports they don't have any discharges this week. She further discussed that she has been following case.  Patient is scheduled to have an LP today per Neuro. Palliative has been following along and speaking with husband.     Expected Discharge Plan: [place] Barriers to Discharge: Continued Medical Work up  Expected Discharge Plan and Services Expected Discharge Plan: [place] In-house Referral: Clinical Social Work     Living arrangements for the past 2 months: Mobile Home                                       Social Determinants of Health (SDOH) Interventions    Readmission Risk Interventions Readmission Risk Prevention Plan 02/13/2019 09/15/2018  Transportation Screening - Complete  Medication Review Press photographer) Complete Complete  PCP or Specialist appointment within 3-5 days of discharge - Complete  HRI or [place] Complete Complete  SW Recovery Care/Counseling Consult Complete Complete  [place] Not Applicable Complete  Some recent data might be hidden

## 2019-03-19 NOTE — Progress Notes (Signed)
Daily Progress Note   Patient Name: Jamie Arias       Date: 03/19/2019 DOB: 05/05/1955  Age: 64 y.o. MRN#: [id number] Attending Physician: Thornell Mule, MD Primary Care Physician: [name], MD Admit Date: 02/09/2019  Reason for Consultation/Follow-up: Establishing goals of care  Subjective: Patient is resting in bed. She does not respond this morning. Spoke with husband [name]. He states when he is here she is responsive and attempts to speak. He states he was advised that she is to have a procedure tomorrow. He states he remains hopeful as she responds to him when he is present, and because there are still procedures and work up that can be completed to look for a cause to her mental status.   Per discussion with husband, he would like for palliative medicine to follow up with him weekly to check in, answer questions, and discuss her status.     Length of Stay: 38  Current Medications: Scheduled Meds:  . aspirin  81 mg Per Tube Daily  . B-complex with vitamin C  1 tablet Per Tube Daily  . Chlorhexidine Gluconate Cloth  6 each Topical Q0600  . clotrimazole  1 Applicatorful Vaginal QHS  . epoetin (EPOGEN/PROCRIT) injection  10,000 Units Intravenous Q M,W,F-HD  . feeding supplement (PRO-STAT SUGAR FREE 64)  30 mL Per Tube Daily  . fluticasone  2 spray Each Nare Daily  . free water  20 mL Per Tube Q4H  . insulin aspart  0-15 Units Subcutaneous Q4H  . levothyroxine  100 mcg Per Tube QAC breakfast  . mouth rinse  15 mL Mouth Rinse BID  . pantoprazole (PROTONIX) IV  40 mg Intravenous Q12H  . psyllium  1 packet Per Tube TID  . venlafaxine  75 mg Per Tube BID WC  . zinc sulfate  220 mg Per Tube Daily    Continuous Infusions: . sodium chloride 250 mL (03/18/19 1635)  .  feeding supplement (OSMOLITE 1.5 CAL) 1,000 mL (03/18/19 1320)  . heparin 700 Units/hr (03/19/19 0457)  . vancomycin 750 mg (03/18/19 1636)    PRN Meds: sodium chloride, acetaminophen, ipratropium-albuterol, ondansetron (ZOFRAN) IV, traMADol  Physical Exam Constitutional:      Comments: Eyes closed.  Pulmonary:     Effort: Pulmonary effort is normal.  Vital Signs: BP (!) 130/99 (BP Location: Right Leg)   Pulse 81   Temp 98.9 F (37.2 C) (Oral)   Resp 20   Ht 5' 4.02" (1.626 m)   Wt 84.3 kg   SpO2 100%   BMI 31.88 kg/m  SpO2: SpO2: 100 % O2 Device: O2 Device: Room Air O2 Flow Rate: O2 Flow Rate (L/min): 1 L/min  Intake/output summary:   Intake/Output Summary (Last 24 hours) at 03/19/2019 1016 Last data filed at 03/19/2019 [id number] Gross per 24 hour  Intake 1858.81 ml  Output 3400 ml  Net -1541.19 ml   LBM: Last BM Date: 03/18/19 Baseline Weight: Weight: 59 kg Most recent weight: Weight: 84.3 kg       Palliative Assessment/Data:     Flowsheet Rows     Most Recent Value  Intake Tab  Referral Department  Hospitalist  Unit at Time of Referral  ICU  Palliative Care Primary Diagnosis  Neurology  Date Notified  02/18/19  Palliative Care Type  New Palliative care  Reason for referral  Clarify Goals of Care  Date of Admission  02/09/19  Date first seen by Palliative Care  02/19/19  # of days Palliative referral response time  1 Day(s)  # of days IP prior to Palliative referral  9  Clinical Assessment  Psychosocial & Spiritual Assessment  Palliative Care Outcomes      Patient Active Problem List   Diagnosis Date Noted  . Abscess   . AF (paroxysmal atrial fibrillation) ([place])   . Diarrhea   . Oropharyngeal dysphagia   . Acute metabolic encephalopathy   . Acute blood loss anemia   . Arm DVT (deep venous thromboembolism), acute, left ([place]) 02/13/2019  . Acute encephalopathy 02/13/2019  . Decubitus ulcer of heel, bilateral, stage 2 ([place]) 02/10/2019    . Sepsis due to methicillin resistant Staphylococcus aureus (MRSA) ([place]) 02/10/2019  . MRSA bacteremia 02/10/2019  . Altered level of consciousness 02/09/2019  . Adjustment disorder with depressed mood 08/31/2018  . COVID-19 virus infection 08/24/2018  . Acute on chronic respiratory failure with hypoxia ([place]) 08/24/2018  . UTI (urinary tract infection) 08/24/2018  . Allergic rhinitis 09/19/2017  . Uremia of renal origin 09/04/2017  . Chronic neck pain 07/25/2017  . Hyperkalemia 02/15/2017  . Complication from renal dialysis device 12/01/2016  . ESRD (end stage renal disease) ([place]) 01/19/2016  . GI bleed 10/25/2015  . Hypertensive heart disease 10/24/2015  . Chronic diastolic CHF (congestive heart failure) ([place]) 10/24/2015  . Unstable angina ([place]) 10/23/2015  . Hypotension 09/02/2015  . Chronic systolic CHF (congestive heart failure) ([place]) 09/02/2015  . Depression 07/27/2015  . Calculus of gallbladder with chronic cholecystitis without obstruction   . Bilateral carotid bruits 11/30/2014  . Low magnesium levels 08/10/2013  . Anemia 08/10/2013  . Constipation 08/10/2013  . Atrial fibrillation, chronic ([place]) 07/30/2013  . Coronary artery disease   . CAD (coronary artery disease) 07/02/2013  . Acute systolic heart failure ([place]) 06/30/2013  . NSTEMI (non-ST elevated myocardial infarction) ([place]) 06/29/2013  . Vertigo 08/25/2012  . Sleep disorder 05/23/2012  . Hypothyroid 04/27/2012  . HTN (hypertension) 04/27/2012  . HLD (hyperlipidemia) 04/27/2012  . Type 2 diabetes mellitus with ESRD (end-stage renal disease) ([place]) 04/27/2012  . Neuropathy 04/27/2012    Palliative Care Assessment & Plan    Recommendations/Plan:  DNR/DNI  Continue to treat the treatable.   Palliative medicine team will check in with husband weekly per conversation and his request.    Code Status:  Code Status Orders  (From admission, onward)         Start     Ordered   02/21/19 0930  Do not  attempt resuscitation (DNR)  Continuous    Question Answer Comment  In the event of cardiac or respiratory ARREST Do not call a "code blue"   In the event of cardiac or respiratory ARREST Do not perform Intubation, CPR, defibrillation or ACLS   In the event of cardiac or respiratory ARREST Use medication by any route, position, wound care, and other measures to relive pain and suffering. May use oxygen, suction and manual treatment of airway obstruction as needed for comfort.      02/21/19 0932        Code Status History    Date Active Date Inactive Code Status Order ID Comments User Context   02/09/2019 [id number] 02/21/2019 0932 Full Code [id number]  Thornell Mule, MD ED   08/24/2018 0022 09/15/2018 1720 Full Code [id number]  Shela Leff, MD Inpatient   09/04/2017 1510 09/06/2017 1920 Full Code [id number]  Epifanio Lesches, MD ED   02/15/2017 1243 02/16/2017 2023 Full Code [id number]  Saundra Shelling, MD Inpatient   10/23/2015 0828 10/23/2015 0908 Full Code [id number]  Saundra Shelling, MD Inpatient   09/03/2015 0130 09/03/2015 1619 Full Code [id number]  Lance Coon, MD Inpatient   07/02/2013 1327 07/14/2013 1540 Full Code [id number]  Nani Skillern, PA-C Inpatient   06/29/2013 2153 07/02/2013 1327 Full Code [id number]  Larey Dresser, MD Inpatient   Advance Care Planning Activity       Prognosis:  Poor overall   Thank you for allowing the Palliative Medicine Team to assist in the care of this patient.   Total Time 25 min Prolonged Time Billed  no      Greater than 50%  of this time was spent counseling and coordinating care related to the above assessment and plan.  Asencion Gowda, NP  Please contact Palliative Medicine Team phone at [phone] for questions and concerns.

## 2019-03-19 NOTE — Consult Note (Signed)
ANTICOAGULATION CONSULT NOTE  Pharmacy Consult for heparin Indication: DVT  Patient Measurements: Height: 5' 4.02" (162.6 cm) Weight: 185 lb 13.6 oz (84.3 kg) IBW/kg (Calculated) : 54.74 Heparin Dosing Weight: 71 kg  Vital Signs: Temp: 98.9 F (37.2 C) (01/14 1202) Temp Source: Oral (01/14 1202) BP: 135/46 (01/14 1202) Pulse Rate: 79 (01/14 1202)  Labs: Recent Labs    03/17/19 0549 03/17/19 1919 03/18/19 0621 03/18/19 0621 03/18/19 1001 03/18/19 2217 03/19/19 0449 03/19/19 0633  HGB   < >  --  8.2*   < > 8.7*  --   --  8.7*  HCT   < >  --  25.3*  --  26.9*  --   --  27.1*  PLT   < >  --  220  --  203  --   --  228  APTT   < > 59* 126*  --   --  41*  --  100*  HEPARINUNFRC  --  2.88* 2.16*  --   --   --   --  0.90*  CREATININE   < >  --  2.99*  --  1.75*  --  2.16*  --    < > = values in this interval not displayed.   Estimated Creatinine Clearance: 27.3 mL/min (A) (by C-G formula based on SCr of 2.16 mg/dL (H)).  Medical History: Past Medical History:  Diagnosis Date  . Anemia   . Chronic diastolic CHF (congestive heart failure) ([place])    a. Due to ischemic cardiomyopathy. EF as low as 35%, improved to normal s/p CABG; b. echo 07/06/13: EF 55-60%, no RWMAs, mod TR, trivial pericardial effusion not c/w tamponade physiology;  c. 10/2015 Echo: EF 65%, Gr1 DD, triv AI, mild MR, mildly dil LA, mod TR, PASP 21mmHg.  Marland Kitchen Coronary artery disease    a. NSTEMI 06/2013; b.cath: severe three-vessel CAD w/ EF 30% & mild-mod MR; c. s/p 3 vessel CABG 07/02/13 (LIMA-LAD, SVG-OM, and SVG-RPDA);  d. 10/2015 MV: no ischemia/infarct.  . Diabetes mellitus without complication ([place])   . Diabetic neuropathy ([place])   . Dialysis patient [facility])    MWF  . ESRD (end stage renal disease) ([place])    a. 12/2015 initiated - mwf dialysis.  Marland Kitchen GERD (gastroesophageal reflux disease)   . Hyperlipidemia   . Hypertension   . Hypothyroidism   . Myocardial infarction ([place]) 2015  . Neuropathy   . Pleural  effusion 2015  . Pulmonary hypertension ([place])   . Renal insufficiency   . Wears dentures    full lower    Medications:  Scheduled:  . aspirin  81 mg Per Tube Daily  . B-complex with vitamin C  1 tablet Per Tube Daily  . Chlorhexidine Gluconate Cloth  6 each Topical Q0600  . clotrimazole  1 Applicatorful Vaginal QHS  . epoetin (EPOGEN/PROCRIT) injection  10,000 Units Intravenous Q M,W,F-HD  . feeding supplement (PRO-STAT SUGAR FREE 64)  30 mL Per Tube Daily  . fluticasone  2 spray Each Nare Daily  . free water  20 mL Per Tube Q4H  . insulin aspart  0-15 Units Subcutaneous Q4H  . levothyroxine  100 mcg Per Tube QAC breakfast  . mouth rinse  15 mL Mouth Rinse BID  . pantoprazole (PROTONIX) IV  40 mg Intravenous Q12H  . psyllium  1 packet Per Tube TID  . venlafaxine  75 mg Per Tube BID WC  . zinc sulfate  220 mg Per Tube Daily  Assessment: 64 y.o. female admitted on 02/09/2019 with MRSA  bacteremia. She was on apixaban PTA but last dose was prior to admission (12/7). [place] 12/15 revealed age-indeterminate short segment occlusive DVT involving the mid humeral aspect of one of the paired brachial veins. On 12/20 a femoral central line was placed. She was on heparin until 03/13/18 when she was changed to apixaban. However, an LP is being ordered in the setting of continued confusion and she is being transitioned to heparin until the procedure is performed. Her last dose of apixaban was 0928 03/17/19. H&H trending down, requiring intermittent transfusions, although there is no active bleeding noted (appears to have stabilized),  PLT wnl, baseline aPTT 59s and heparin level 2.88 U/mL  Goal of Therapy:  Heparin Level: 0.3 - 0.7 units/mL aPTT 66 - 102s Monitor platelets by anticoagulation protocol: Yes   Heparin Course: 1/12 initiation: 800 units/hr 1/13 0648 aPTT 126s, HL 2.16: reduce to 650 units/hr 1/13 2200 aPTT 41s: inc rate to 700 units/hr 1/14 0633 aPTT 100s, HL 0.90 IU/mL: no  change 1/14 1401 aPTT 73s: no change  Plan:   2nd consecutive aPTT therapeutic: continue at 700 units/hr  Heparin is being stopped at midnight for a lumbar puncture in the morning  CBC in am  Vallery Sa, PharmD, BCPS Clinical Pharmacist 03/19/2019 2:34 PM

## 2019-03-20 ENCOUNTER — Inpatient Hospital Stay: Payer: Medicare Other

## 2019-03-20 LAB — VITAMIN B1: Vitamin B1 (Thiamine): 201.9 nmol/L — ABNORMAL HIGH (ref 66.5–200.0)

## 2019-03-20 LAB — PROTEIN, CSF: Total  Protein, CSF: 73 mg/dL — ABNORMAL HIGH (ref 15–45)

## 2019-03-20 LAB — GLUCOSE, CSF: Glucose, CSF: 83 mg/dL — ABNORMAL HIGH (ref 40–70)

## 2019-03-20 LAB — CSF CELL COUNT WITH DIFFERENTIAL
Eosinophils, CSF: 0 %
Lymphs, CSF: 53 %
Monocyte-Macrophage-Spinal Fluid: 47 %
RBC Count, CSF: 3 /mm3 (ref 0–3)
Segmented Neutrophils-CSF: 0 %
Tube #: 3
WBC, CSF: 8 /mm3 — ABNORMAL HIGH (ref 0–5)

## 2019-03-20 LAB — GLUCOSE, CAPILLARY
Glucose-Capillary: 183 mg/dL — ABNORMAL HIGH (ref 70–99)
Glucose-Capillary: 147 mg/dL — ABNORMAL HIGH (ref 70–99)
Glucose-Capillary: 108 mg/dL — ABNORMAL HIGH (ref 70–99)
Glucose-Capillary: 107 mg/dL — ABNORMAL HIGH (ref 70–99)
Glucose-Capillary: 199 mg/dL — ABNORMAL HIGH (ref 70–99)

## 2019-03-20 LAB — BASIC METABOLIC PANEL
Anion gap: 9 (ref 5–15)
BUN: 43 mg/dL — ABNORMAL HIGH (ref 8–23)
CO2: 26 mmol/L (ref 22–32)
Calcium: 8.3 mg/dL — ABNORMAL LOW (ref 8.9–10.3)
Chloride: 101 mmol/L (ref 98–111)
Creatinine, Ser: 3.02 mg/dL — ABNORMAL HIGH (ref 0.44–1.00)
GFR calc Af Amer: 18 mL/min — ABNORMAL LOW (ref >60)
GFR calc non Af Amer: 16 mL/min — ABNORMAL LOW (ref >60)
Glucose, Bld: 143 mg/dL — ABNORMAL HIGH (ref 70–99)
Potassium: 4.3 mmol/L (ref 3.5–5.1)
Sodium: 136 mmol/L (ref 135–145)

## 2019-03-20 LAB — CBC
HCT: 24.7 % — ABNORMAL LOW (ref 36.0–46.0)
HCT: 26.4 % — ABNORMAL LOW (ref 36.0–46.0)
Hemoglobin: 7.8 g/dL — ABNORMAL LOW (ref 12.0–15.0)
Hemoglobin: 8.3 g/dL — ABNORMAL LOW (ref 12.0–15.0)
MCH: 30.6 pg (ref 26.0–34.0)
MCH: 31.2 pg (ref 26.0–34.0)
MCHC: 31.6 g/dL (ref 30.0–36.0)
MCHC: 31.4 g/dL (ref 30.0–36.0)
MCV: 96.9 fL (ref 80.0–100.0)
MCV: 99.2 fL (ref 80.0–100.0)
Platelets: 235 10*3/uL (ref 150–400)
Platelets: 228 10*3/uL (ref 150–400)
RBC: 2.55 MIL/uL — ABNORMAL LOW (ref 3.87–5.11)
RBC: 2.66 MIL/uL — ABNORMAL LOW (ref 3.87–5.11)
RDW: 19.7 % — ABNORMAL HIGH (ref 11.5–15.5)
RDW: 19.8 % — ABNORMAL HIGH (ref 11.5–15.5)
WBC: 4.4 10*3/uL (ref 4.0–10.5)
WBC: 5 10*3/uL (ref 4.0–10.5)
nRBC: 0 % (ref 0.0–0.2)
nRBC: 0 % (ref 0.0–0.2)

## 2019-03-20 LAB — APTT: aPTT: 42 seconds — ABNORMAL HIGH (ref 24–36)

## 2019-03-20 LAB — PROTIME-INR
INR: 1.1 (ref 0.8–1.2)
Prothrombin Time: 13.8 seconds (ref 11.4–15.2)

## 2019-03-20 MED ORDER — ALTEPLASE 2 MG IJ SOLR: 2.0000 mg | Freq: Once | INTRAMUSCULAR | Status: DC

## 2019-03-20 MED ORDER — ALBUMIN HUMAN 25 % IV SOLN
25.0000 g | Freq: Once | INTRAVENOUS | Status: AC
  Administered 2019-03-20: 25 g via INTRAVENOUS
  Filled 2019-03-20: qty 100

## 2019-03-20 MED ORDER — SODIUM CHLORIDE 0.9 % IV SOLN
3.0000 g | INTRAVENOUS | Status: DC
  Administered 2019-03-20: 3 g via INTRAVENOUS
  Filled 2019-03-20: qty 3

## 2019-03-20 MED ORDER — APIXABAN 5 MG PO TABS
5.0000 mg | ORAL_TABLET | Freq: Two times a day (BID) | ORAL | Status: DC
  Administered 2019-03-21 – 2019-04-08 (×36): 5 mg via ORAL
  Filled 2019-03-20 (×36): qty 1

## 2019-03-20 MED ORDER — ALTEPLASE 2 MG IJ SOLR
2.0000 mg | Freq: Once | INTRAMUSCULAR | Status: DC
  Filled 2019-03-20: qty 2

## 2019-03-20 NOTE — Progress Notes (Signed)
Reason for consult: Persistent Encephalopathy   Subjective: Patient lethargic, mumbles incoherently. She underwent LP    ROS: negative except above  Examination  Vital signs in last 24 hours: Temp:  [98.5 F (36.9 C)-98.8 F (37.1 C)] 98.8 F (37.1 C) (01/15 1348) Pulse Rate:  [80-88] 88 (01/15 1348) Resp:  [14-20] 15 (01/15 1348) BP: (122-145)/(40-77) 145/55 (01/15 1348) SpO2:  [98 %-100 %] 99 % (01/15 1348)  General: lying in bed CVS: pulse-normal rate and rhythm RS: breathing comfortably Extremities: normal   Neuro: MS: Awake, not following commands. Sometimes says "Stop" to noxious stimulus CN: pupils equal and reactive,  EOMI, face symmetric, tongue midline Motor: Withdraws in bilateral upper extremities to pain, minimal withdrawal in both lower extremities Sensory: grimaces to painful stimuli b/l  Plantars: extensor bilaterally   Basic Metabolic Panel: Recent Labs  Lab 03/14/19 0502 03/14/19 0502 03/15/19 0509 03/15/19 0509 03/16/19 [id number] 03/16/19 [id number] 03/17/19 0549 03/17/19 0549 03/18/19 0621 03/18/19 0621 03/18/19 1001 03/19/19 0449 03/20/19 0819  NA 138   < > 138   < > 136   < > 137  --  137  --  136 136 136  K 2.8*   < > 3.8   < > 4.6   < > 4.0  --  4.2  --  3.2* 4.1 4.3  CL 104   < > 101   < > 101   < > 101  --  100  --  98 101 101  CO2 30   < > 26   < > 25   < > 29  --  28  --  29 29 26   GLUCOSE 171*   < > 203*   < > 175*   < > 163*  --  170*  --  156* 154* 143*  BUN 21   < > 34*   < > 50*   < > 33*  --  43*  --  24* 28* 43*  CREATININE 1.90*   < > 2.70*   < > 3.44*   < > 2.30*  --  2.99*  --  1.75* 2.16* 3.02*  CALCIUM 8.1*   < > 8.1*   < > 7.9*   < > 7.9*   < > 8.1*   < > 7.9* 7.9* 8.3*  MG  --   --   --   --   --   --   --   --   --   --  1.7  --   --   PHOS 2.0*  --  4.0  --  4.9*  --   --   --   --   --  2.4*  --   --    < > = values in this interval not displayed.    CBC: Recent Labs  Lab 03/18/19 0621 03/18/19 1001 03/19/19 0633  03/20/19 0539 03/20/19 1215  WBC 5.7 5.6 4.6 4.4 5.0  HGB 8.2* 8.7* 8.7* 7.8* 8.3*  HCT 25.3* 26.9* 27.1* 24.7* 26.4*  MCV 95.8 95.4 96.8 96.9 99.2  PLT 220 203 228 235 228     Coagulation Studies: Recent Labs    03/20/19 0819  LABPROT 13.8  INR 1.1        ASSESSMENT AND PLAN  64 year female with h/o of Afib on Eliquis, ESRD, COVID 19 infection in June 2020 with  persistent encephalopathy after initially presenting with AMS and fever on 12/07 due to MRSA bacteremia with cultures  negative since 12/ Has been evaluated by neurology multiple times, EEGx 2 negative, MRI [facility] Edelman w/o contrast x 2 negative    Detailed course from Dr. Cecil Cobbs note   "This is a complicated patient who was initially admitted on 12/7 with altered level of consciousness.  This was initially attributed to metabolic factors including missing dialysis.  She was found to have MRSA bacteremia on 12/8 started on IV vancomycin and Zosyn.  On 12/10 she remains lethargic, did not talk much but would say her name.  The next few days, was noted that she was gradually improving and there is note of "dystonic" jaw movements.  EEG was performed 12/9 with triphasic waves without epileptiform appearance.     She was given Ceftaroline and daptomycin and had positive blood cultures through 12/15 with a negative blood culture on 12/16.  She has not been febrile recently, but has had low-grade temperature elevations in the mid 99's.    Neurology was consulted on 12/17 and given the length of time the patient had already been hospitalized and aggressive treatment that she had received, LP was not considered to be likely to provide any additional benefit.  Palliative care also began talking with family on 12/17.  TEE was considered, but due to her mental status she was not considered a candidate.   Over the next few days, her mental status continued to worsen the patient was not eating.  On 12/21, due to having a wound on her  graft, she had dialysis catheter placed in left IJ.  She has been anemic with a hemoglobin of 6.8 on 12/22 after discontinuation of ceftaroline, on 12/23, it was noted there was improvement of her mental status and there was question about whether this could have been cephalosporin related encephalopathy.  But then she worsened again.   She had a PEG placed on 12/29. She was intubated for the procedure and was not extubated after the procedure.  She had her graft explanted on 12/30, as there was concern for source control.  She was extubated on 12/31.  She apparently was speaking with the nurse after extubation.  On 1/1 it was noted that she was more responsive than she had been the entire hospitalization, but then again on 1/3 as noted she was not answering questions or following commands.   Neurology reconsulted on 1/5, at that time she did not follow commands or answer questions. Reevaluated by neurology on 01/09 - "For the past year he has had to do all of the cooking, cleaning and handling of finances.  He has noticed some memory issues as well." At that point, she was started on thiamine. "  LP completed today, CSF shows only 8 WBC, Protein elevated at 73, Glucose 83.   Recommendations F/U remaining CSF labs including oligoclonal bands Continue supportive treatment and thiamine    Glenisha Gundry [facility] Pager Number [phone] For questions after 7pm please refer to AMION to reach the Neurologist on call

## 2019-03-20 NOTE — Progress Notes (Signed)
[facility]  ROUNDING NOTE   Subjective:   LP for today.   Hemodialysis for today.   Patient is not responsive, not opening her eyes. Not responding to painful stimuli.   Objective:  Vital signs in last 24 hours:  Temp:  [98.5 F (36.9 C)-98.9 F (37.2 C)] 98.5 F (36.9 C) (01/15 0338) Pulse Rate:  [79-84] 84 (01/15 0338) Resp:  [14-20] 14 (01/15 0338) BP: (122-135)/(40-55) 122/55 (01/15 0338) SpO2:  [99 %-100 %] 99 % (01/15 0338)  Weight change:  Filed Weights   03/16/19 1400 03/18/19 0850 03/18/19 1206  Weight: 86.5 kg 88.4 kg 84.3 kg    Intake/Output: I/O last 3 completed shifts: In: 2864.7 [I.V.:311.4; NG/GT:2553.3] Out: 600 [Stool:600]   Intake/Output this shift:  Total I/O In: 480 [NG/GT:480] Out: -   Physical Exam: General: NAD, laying in bed  HEENT [place]/AT   Lungs:  clear  Heart: Regular rate and rhythm  Abdomen:  Soft, nontender, PEG tube in place  Extremities: 2+ upper and lower extremity edema, left arm dressings  Neurologic: Lethargic , opens eyes spontaneously.       Access: Left IJ PC      Basic Metabolic Panel: Recent Labs  Lab 03/14/19 0502 03/14/19 0502 03/15/19 0509 03/15/19 0509 03/16/19 [id number] 03/16/19 [id number] 03/17/19 0549 03/17/19 0549 03/18/19 0621 03/18/19 0621 03/18/19 1001 03/19/19 0449 03/20/19 0819  NA 138   < > 138   < > 136   < > 137  --  137  --  136 136 136  K 2.8*   < > 3.8   < > 4.6   < > 4.0  --  4.2  --  3.2* 4.1 4.3  CL 104   < > 101   < > 101   < > 101  --  100  --  98 101 101  CO2 30   < > 26   < > 25   < > 29  --  28  --  29 29 26   GLUCOSE 171*   < > 203*   < > 175*   < > 163*  --  170*  --  156* 154* 143*  BUN 21   < > 34*   < > 50*   < > 33*  --  43*  --  24* 28* 43*  CREATININE 1.90*   < > 2.70*   < > 3.44*   < > 2.30*  --  2.99*  --  1.75* 2.16* 3.02*  CALCIUM 8.1*   < > 8.1*   < > 7.9*   < > 7.9*   < > 8.1*   < > 7.9* 7.9* 8.3*  MG  --   --   --   --   --   --   --   --   --   --  1.7  --    --   PHOS 2.0*  --  4.0  --  4.9*  --   --   --   --   --  2.4*  --   --    < > = values in this interval not displayed.    Liver Function Tests: Recent Labs  Lab 03/14/19 0502 03/15/19 0509 03/16/19 0832 03/18/19 1001  ALBUMIN 1.8* 1.8* 1.7* 1.8*   No results for input(s): LIPASE, AMYLASE in the last 168 hours. No results for input(s): AMMONIA in the last 168 hours.  CBC: Recent Labs  Lab 03/17/19  [id number] 03/18/19 0621 03/18/19 1001 03/19/19 0633 03/20/19 0539  WBC 6.4 5.7 5.6 4.6 4.4  HGB 6.8* 8.2* 8.7* 8.7* 7.8*  HCT 22.2* 25.3* 26.9* 27.1* 24.7*  MCV 101.8* 95.8 95.4 96.8 96.9  PLT 198 220 203 228 235    Cardiac Enzymes: No results for input(s): CKTOTAL, CKMB, CKMBINDEX, TROPONINI in the last 168 hours.  BNP: Invalid input(s): POCBNP  CBG: Recent Labs  Lab 03/19/19 1625 03/19/19 2013 03/19/19 2257 03/20/19 0336 03/20/19 0800  GLUCAP 202* 128* 146* 183* 147*    Microbiology: Results for orders placed or performed during the hospital encounter of 02/09/19  Culture, blood (routine x 2)     Status: Abnormal   Collection Time: 02/09/19  2:29 PM   Specimen: BLOOD LEFT ARM  Result Value Ref Range Status   Specimen Description   Final    BLOOD LEFT ARM Performed at [facility], [address], [place], [place] [address]    Special Requests   Final    BOTTLES DRAWN AEROBIC AND ANAEROBIC Blood Culture adequate volume Performed at [facility], [address] [place], [place] [address]    Culture  Setup Time   Final    GRAM POSITIVE COCCI IN BOTH AEROBIC AND ANAEROBIC BOTTLES CRITICAL VALUE NOTED.  VALUE IS CONSISTENT WITH PREVIOUSLY REPORTED AND CALLED VALUE. Performed at [facility], [address] [place], [place] [address]    Culture (A)  Final    STAPHYLOCOCCUS AUREUS SUSCEPTIBILITIES PERFORMED ON PREVIOUS CULTURE WITHIN THE LAST 5 DAYS. Performed at [facility], [place] [address], [place], [place] [address]     Report Status 02/12/2019 FINAL  Final  Culture, blood (routine x 2)     Status: Abnormal   Collection Time: 02/09/19  2:38 PM   Specimen: BLOOD LEFT ARM  Result Value Ref Range Status   Specimen Description   Final    BLOOD LEFT ARM Performed at [facility], [address] [place], [place] [address]    Special Requests   Final    BOTTLES DRAWN AEROBIC AND ANAEROBIC Blood Culture adequate volume Performed at [facility], [address], [place], [place] [address]    Culture  Setup Time   Final    GRAM POSITIVE COCCI IN BOTH AEROBIC AND ANAEROBIC BOTTLES CRITICAL RESULT CALLED TO, READ BACK BY AND VERIFIED WITH: [place] [id number] 02/10/2019 HNM Performed at [facility], [place] [address] [place],  [address]    Culture METHICILLIN RESISTANT STAPHYLOCOCCUS AUREUS (A)  Final   Report Status 02/12/2019 FINAL  Final   Organism ID, Bacteria METHICILLIN RESISTANT STAPHYLOCOCCUS AUREUS  Final      Susceptibility   Methicillin resistant staphylococcus aureus - MIC*    CIPROFLOXACIN >=8 RESISTANT Resistant     ERYTHROMYCIN >=8 RESISTANT Resistant     GENTAMICIN <=0.5 SENSITIVE Sensitive     OXACILLIN >=4 RESISTANT Resistant     TETRACYCLINE <=1 SENSITIVE Sensitive     VANCOMYCIN 1 SENSITIVE Sensitive     TRIMETH/SULFA <=10 SENSITIVE Sensitive     CLINDAMYCIN RESISTANT Resistant     RIFAMPIN <=0.5 SENSITIVE Sensitive     Inducible Clindamycin POSITIVE Resistant     * METHICILLIN RESISTANT STAPHYLOCOCCUS AUREUS  Blood Culture ID Panel (Reflexed)     Status: Abnormal   Collection Time: 02/09/19  2:38 PM  Result Value Ref Range Status   Enterococcus species NOT DETECTED NOT DETECTED Final   Listeria monocytogenes NOT DETECTED NOT  DETECTED Final   Staphylococcus species DETECTED (A) NOT DETECTED Final    Comment: CRITICAL RESULT CALLED TO, READ BACK BY AND VERIFIED WITH: Hart Robinsons PHARMD [id number] 02/10/2019 HNM    Staphylococcus aureus (BCID) DETECTED (A)  NOT DETECTED Final    Comment: Methicillin (oxacillin)-resistant Staphylococcus aureus (MRSA). MRSA is predictably resistant to beta-lactam antibiotics (except ceftaroline). Preferred therapy is vancomycin unless clinically contraindicated. Patient requires contact precautions if  hospitalized. CRITICAL RESULT CALLED TO, READ BACK BY AND VERIFIED WITH: Hart Robinsons PHARMD [id number] 02/10/2019 HNM    Methicillin resistance DETECTED (A) NOT DETECTED Final    Comment: CRITICAL RESULT CALLED TO, READ BACK BY AND VERIFIED WITH: Hart Robinsons PHARMD [id number] 02/10/2019 HNM    Streptococcus species NOT DETECTED NOT DETECTED Final   Streptococcus agalactiae NOT DETECTED NOT DETECTED Final   Streptococcus pneumoniae NOT DETECTED NOT DETECTED Final   Streptococcus pyogenes NOT DETECTED NOT DETECTED Final   Acinetobacter baumannii NOT DETECTED NOT DETECTED Final   Enterobacteriaceae species NOT DETECTED NOT DETECTED Final   Enterobacter cloacae complex NOT DETECTED NOT DETECTED Final   Escherichia coli NOT DETECTED NOT DETECTED Final   Klebsiella oxytoca NOT DETECTED NOT DETECTED Final   Klebsiella pneumoniae NOT DETECTED NOT DETECTED Final   Proteus species NOT DETECTED NOT DETECTED Final   Serratia marcescens NOT DETECTED NOT DETECTED Final   Haemophilus influenzae NOT DETECTED NOT DETECTED Final   Neisseria meningitidis NOT DETECTED NOT DETECTED Final   Pseudomonas aeruginosa NOT DETECTED NOT DETECTED Final   Candida albicans NOT DETECTED NOT DETECTED Final   Candida glabrata NOT DETECTED NOT DETECTED Final   Candida krusei NOT DETECTED NOT DETECTED Final   Candida parapsilosis NOT DETECTED NOT DETECTED Final   Candida tropicalis NOT DETECTED NOT DETECTED Final    Comment: Performed at [facility], [name], [place] [address]  SARS CORONAVIRUS 2 (TAT 6-24 HRS) Nasopharyngeal Nasopharyngeal Swab     Status: None   Collection Time: 02/09/19  2:54 PM   Specimen: Nasopharyngeal Swab   Result Value Ref Range Status   SARS Coronavirus 2 NEGATIVE NEGATIVE Final    Comment: (NOTE) SARS-CoV-2 target nucleic acids are NOT DETECTED. The SARS-CoV-2 RNA is generally detectable in upper and lower respiratory specimens during the acute phase of infection. Negative results do not preclude SARS-CoV-2 infection, do not rule out co-infections with other pathogens, and should not be used as the sole basis for treatment or other patient management decisions. Negative results must be combined with clinical observations, patient history, and epidemiological information. The expected result is Negative. Fact Sheet for Patients: SugarRoll.be Fact Sheet for Healthcare Providers: https://www.woods-mathews.com/ This test is not yet approved or cleared by the [place] FDA and  has been authorized for detection and/or diagnosis of SARS-CoV-2 by FDA under an Emergency Use Authorization (EUA). This EUA will remain  in effect (meaning this test can be used) for the duration of the COVID-19 declaration under Section 56 4(b)(1) of the Act, 21 U.S.C. section 360bbb-3(b)(1), unless the authorization is terminated or revoked sooner. Performed at [facility], [place] [address] [place],  [address]   MRSA PCR Screening     Status: Abnormal   Collection Time: 02/10/19  5:03 PM   Specimen: Nasopharyngeal  Result Value Ref Range Status   MRSA by PCR POSITIVE (A) NEGATIVE Final    Comment:        The GeneXpert MRSA Assay (FDA approved for NASAL specimens only), is one component of  a comprehensive MRSA colonization surveillance program. It is not intended to diagnose MRSA infection nor to guide or monitor treatment for MRSA infections. RESULT CALLED TO, READ BACK BY AND VERIFIED WITH: ALEKSEY FRASER @1839  02/10/19 MJU Performed at [facility], [address] [place], [place] [address]   Culture, blood (routine x 2)      Status: Abnormal   Collection Time: 02/11/19 12:31 PM   Specimen: BLOOD  Result Value Ref Range Status   Specimen Description   Final    BLOOD PORTA CATH Performed at [facility] [address], [place], [place] [address]    Special Requests   Final    BOTTLES DRAWN AEROBIC AND ANAEROBIC Blood Culture adequate volume Performed at [facility], [address] [place], [place] [address]    Culture  Setup Time   Final    GRAM POSITIVE COCCI ANAEROBIC BOTTLE ONLY CRITICAL RESULT CALLED TO, READ BACK BY AND VERIFIED WITH: SCOTT HALL AT [id number] 02/12/2019 [facility]  Performed at [facility], [address] [place], [place] [address]    Culture (A)  Final    STAPHYLOCOCCUS AUREUS SUSCEPTIBILITIES PERFORMED ON PREVIOUS CULTURE WITHIN THE LAST 5 DAYS. Performed at [facility], [place] [address] [place], [place] [address]    Report Status 02/13/2019 FINAL  Final  Culture, blood (routine x 2)     Status: None   Collection Time: 02/11/19  3:57 PM   Specimen: BLOOD  Result Value Ref Range Status   Specimen Description BLOOD PORTA CATH  Final   Special Requests   Final    BOTTLES DRAWN AEROBIC AND ANAEROBIC Blood Culture adequate volume   Culture   Final    NO GROWTH 5 DAYS Performed at [facility], [address] [place], [place] [address]    Report Status 02/16/2019 FINAL  Final  CULTURE, BLOOD (ROUTINE X 2) w Reflex to ID Panel     Status: Abnormal   Collection Time: 02/13/19  2:18 PM   Specimen: BLOOD LEFT HAND  Result Value Ref Range Status   Specimen Description BLOOD LEFT HAND  Final   Special Requests   Final    BOTTLES DRAWN AEROBIC AND ANAEROBIC Blood Culture results may not be optimal due to an inadequate volume of blood received in culture bottles   Culture  Setup Time   Final    AEROBIC BOTTLE ONLY GRAM POSITIVE COCCI IN CLUSTERS CRITICAL RESULT CALLED TO, READ BACK BY AND VERIFIED WITH: Herbert Pun AT [id number] ON 02/14/2019  [place]. Performed at [facility], [place] [address], [place], [place] [address]    Culture METHICILLIN RESISTANT STAPHYLOCOCCUS AUREUS (A)  Final   Report Status 02/16/2019 FINAL  Final   Organism ID, Bacteria METHICILLIN RESISTANT STAPHYLOCOCCUS AUREUS  Final      Susceptibility   Methicillin resistant staphylococcus aureus - MIC*    CIPROFLOXACIN >=8 RESISTANT Resistant     ERYTHROMYCIN >=8 RESISTANT Resistant     GENTAMICIN <=0.5 SENSITIVE Sensitive     OXACILLIN >=4 RESISTANT Resistant     TETRACYCLINE <=1 SENSITIVE Sensitive     VANCOMYCIN <=0.5 SENSITIVE Sensitive     TRIMETH/SULFA <=10 SENSITIVE Sensitive     CLINDAMYCIN RESISTANT Resistant     RIFAMPIN <=0.5 SENSITIVE Sensitive     Inducible Clindamycin POSITIVE Resistant     * METHICILLIN RESISTANT STAPHYLOCOCCUS AUREUS  Blood Culture ID Panel (Reflexed)     Status: Abnormal   Collection Time: 02/13/19  2:18 PM  Result Value Ref Range Status   Enterococcus species NOT DETECTED NOT DETECTED Final   Listeria monocytogenes NOT DETECTED NOT DETECTED Final   Staphylococcus species DETECTED (A) NOT DETECTED Final    Comment: CRITICAL RESULT CALLED TO, READ BACK BY AND VERIFIED WITH: Herbert Pun AT [id number] ON 02/14/2019 [place].    Staphylococcus aureus (BCID) DETECTED (A) NOT DETECTED Final    Comment: Methicillin (oxacillin)-resistant Staphylococcus aureus (MRSA). MRSA is predictably resistant to beta-lactam antibiotics (except ceftaroline). Preferred therapy is vancomycin unless clinically contraindicated. Patient requires contact precautions if  hospitalized. CRITICAL RESULT CALLED TO, READ BACK BY AND VERIFIED WITH: Herbert Pun AT [id number] ON 02/14/2019 [place].    Methicillin resistance DETECTED (A) NOT DETECTED Final    Comment: CRITICAL RESULT CALLED TO, READ BACK BY AND VERIFIED WITH: Herbert Pun AT [id number] ON 02/14/2019 [place].    Streptococcus species NOT DETECTED NOT DETECTED Final   Streptococcus agalactiae NOT DETECTED  NOT DETECTED Final   Streptococcus pneumoniae NOT DETECTED NOT DETECTED Final   Streptococcus pyogenes NOT DETECTED NOT DETECTED Final   Acinetobacter baumannii NOT DETECTED NOT DETECTED Final   Enterobacteriaceae species NOT DETECTED NOT DETECTED Final   Enterobacter cloacae complex NOT DETECTED NOT DETECTED Final   Escherichia coli NOT DETECTED NOT DETECTED Final   Klebsiella oxytoca NOT DETECTED NOT DETECTED Final   Klebsiella pneumoniae NOT DETECTED NOT DETECTED Final   Proteus species NOT DETECTED NOT DETECTED Final   Serratia marcescens NOT DETECTED NOT DETECTED Final   Haemophilus influenzae NOT DETECTED NOT DETECTED Final   Neisseria meningitidis NOT DETECTED NOT DETECTED Final   Pseudomonas aeruginosa NOT DETECTED NOT DETECTED Final   Candida albicans NOT DETECTED NOT DETECTED Final   Candida glabrata NOT DETECTED NOT DETECTED Final   Candida krusei NOT DETECTED NOT DETECTED Final   Candida parapsilosis NOT DETECTED NOT DETECTED Final   Candida tropicalis NOT DETECTED NOT DETECTED Final    Comment: Performed at [facility], [address] [place], [place] [address]  CULTURE, BLOOD (ROUTINE X 2) w Reflex to ID Panel     Status: None   Collection Time: 02/13/19  3:09 PM   Specimen: BLOOD  Result Value Ref Range Status   Specimen Description BLOOD BLOOD LEFT HAND  Final   Special Requests   Final    BOTTLES DRAWN AEROBIC AND ANAEROBIC Blood Culture adequate volume   Culture   Final    NO GROWTH 5 DAYS Performed at [facility], [address] [place], [place] [address]    Report Status 02/18/2019 FINAL  Final  Culture, blood (routine x 2)     Status: Abnormal   Collection Time: 02/15/19 12:49 PM   Specimen: BLOOD  Result Value Ref Range Status   Specimen Description   Final    BLOOD LT HAND Performed at [facility], [address] [place], [place] [address]    Special Requests   Final    BOTTLES DRAWN AEROBIC AND ANAEROBIC Blood  Culture results may not be optimal due to an inadequate volume of blood received in culture bottles Performed at [facility], [address] [place], [place] [address]    Culture  Setup Time   Final    GRAM POSITIVE COCCI IN BOTH AEROBIC AND ANAEROBIC BOTTLES CRITICAL RESULT CALLED TO, READ BACK BY AND VERIFIED WITHEleonore Chiquito AT [id number] 02/16/2019 SDR Performed at [facility], [address], [place], [place] [address]    Culture (A)  Final    METHICILLIN RESISTANT STAPHYLOCOCCUS AUREUS STAPHYLOCOCCUS SPECIES (COAGULASE NEGATIVE) THE SIGNIFICANCE OF ISOLATING THIS ORGANISM FROM A SINGLE SET OF BLOOD CULTURES WHEN MULTIPLE SETS ARE DRAWN IS UNCERTAIN. PLEASE NOTIFY THE MICROBIOLOGY DEPARTMENT WITHIN ONE WEEK IF SPECIATION AND SENSITIVITIES ARE REQUIRED. Performed at [facility], [place] [address] [place], [place] [address]    Report Status 02/18/2019 FINAL  Final   Organism ID, Bacteria METHICILLIN RESISTANT STAPHYLOCOCCUS AUREUS  Final      Susceptibility   Methicillin resistant staphylococcus aureus - MIC*    CIPROFLOXACIN >=8 RESISTANT Resistant     ERYTHROMYCIN >=8 RESISTANT Resistant     GENTAMICIN <=0.5 SENSITIVE Sensitive     OXACILLIN >=4 RESISTANT Resistant     TETRACYCLINE <=1 SENSITIVE Sensitive     VANCOMYCIN <=0.5 SENSITIVE Sensitive     TRIMETH/SULFA <=10 SENSITIVE Sensitive     CLINDAMYCIN RESISTANT Resistant     RIFAMPIN <=0.5 SENSITIVE Sensitive     Inducible Clindamycin POSITIVE Resistant     * METHICILLIN RESISTANT STAPHYLOCOCCUS AUREUS  Aerobic Culture (superficial specimen)     Status: None   Collection Time: 02/17/19  1:31 PM   Specimen: Heel; Wound  Result Value Ref Range Status   Specimen Description   Final    HEEL Performed at [facility], [address], [place], [place] [address]    Special Requests   Final    NONE Performed at [facility], [address] [place], [place] [address]    Gram Stain    Final    NO WBC SEEN MODERATE GRAM POSITIVE COCCI IN PAIRS    Culture   Final    ABUNDANT METHICILLIN RESISTANT STAPHYLOCOCCUS AUREUS ABUNDANT DIPHTHEROIDS(CORYNEBACTERIUM SPECIES) Standardized susceptibility testing for this organism is not available. Performed at [facility], [place] [address], [place], [place] [address]    Report Status 02/19/2019 FINAL  Final   Organism ID, Bacteria METHICILLIN RESISTANT STAPHYLOCOCCUS AUREUS  Final      Susceptibility   Methicillin resistant staphylococcus aureus - MIC*    CIPROFLOXACIN >=8 RESISTANT Resistant     ERYTHROMYCIN >=8 RESISTANT Resistant     GENTAMICIN <=0.5 SENSITIVE Sensitive     OXACILLIN >=4 RESISTANT Resistant     TETRACYCLINE <=1 SENSITIVE Sensitive     VANCOMYCIN 1 SENSITIVE Sensitive     TRIMETH/SULFA <=10 SENSITIVE Sensitive     CLINDAMYCIN RESISTANT Resistant     RIFAMPIN <=0.5 SENSITIVE Sensitive     Inducible Clindamycin POSITIVE Resistant     * ABUNDANT METHICILLIN RESISTANT STAPHYLOCOCCUS AUREUS  CULTURE, BLOOD (ROUTINE X 2) w Reflex to ID Panel     Status: None   Collection Time: 02/18/19 11:29 PM   Specimen: BLOOD  Result Value Ref Range Status   Specimen Description BLOOD LEFT ARM  Final   Special Requests   Final    BOTTLES DRAWN AEROBIC AND ANAEROBIC Blood Culture adequate volume   Culture   Final    NO GROWTH 5 DAYS Performed at [facility], [address] [place],  [address]    Report Status 02/23/2019 FINAL  Final  CULTURE, BLOOD (ROUTINE X 2) w Reflex to ID Panel     Status: None   Collection Time: 02/18/19 11:29 PM   Specimen: BLOOD  Result Value Ref Range Status   Specimen Description BLOOD LEFT ARM  Final   Special Requests   Final    BOTTLES DRAWN AEROBIC AND ANAEROBIC Blood Culture adequate volume  Culture   Final    NO GROWTH 5 DAYS Performed at [facility], [address] [place], [place] [address]    Report Status 02/23/2019 FINAL  Final   Aerobic/Anaerobic Culture (surgical/deep wound)     Status: None   Collection Time: 03/04/19 11:04 AM   Specimen: PATH Other; Tissue  Result Value Ref Range Status   Specimen Description   Final    ARTERY Performed at [facility], [address], [place], [place] [address]    Special Requests   Final    RIGHT AV GRAFT AND STENTS CULTURE Performed at [facility], [address] [place], [place] [address]    Gram Stain   Final    RARE WBC PRESENT, PREDOMINANTLY PMN NO ORGANISMS SEEN    Culture   Final    No growth aerobically or anaerobically. Performed at [facility], [place] [address], [place], [place] [address]    Report Status 03/09/2019 FINAL  Final  Aerobic/Anaerobic Culture (surgical/deep wound)     Status: None   Collection Time: 03/04/19 11:29 AM   Specimen: PATH Other; Tissue  Result Value Ref Range Status   Specimen Description   Final    ARTERY Performed at [facility], [address] [place], [place] [address]    Special Requests   Final    FLUID AROUND AV GRAFT Performed at [facility], [address] [place], [place] [address]    Gram Stain   Final    FEW WBC PRESENT, PREDOMINANTLY PMN NO ORGANISMS SEEN    Culture   Final    No growth aerobically or anaerobically. Performed at [facility], [place] [address], [place], [place] [address]    Report Status 03/09/2019 FINAL  Final  Wet prep, genital     Status: Abnormal   Collection Time: 03/16/19 10:01 AM  Result Value Ref Range Status   Yeast Wet Prep HPF POC PRESENT (A) NONE SEEN Final   Trich, Wet Prep NONE SEEN NONE SEEN Final   Clue Cells Wet Prep HPF POC NONE SEEN NONE SEEN Final   WBC, Wet Prep HPF POC MANY (A) NONE SEEN Final   Sperm NONE SEEN  Final    Comment: Performed at [facility], [address] [place], [place] [address]  Aerobic Culture (superficial specimen)     Status: None (Preliminary result)   Collection Time:  03/17/19  5:13 PM   Specimen: Vaginal Fluid  Result Value Ref Range Status   Specimen Description   Final    VAGINA Performed at [facility], [address] [place], [place] [address]    Special Requests   Final    NONE Performed at [facility], [address] [place], [place] [address]    Gram Stain   Final    MODERATE WBC PRESENT,BOTH PMN AND MONONUCLEAR FEW GRAM POSITIVE COCCI IN PAIRS FEW GRAM NEGATIVE RODS Performed at [facility], [place] [address] [place], [place] [address]    Culture   Final    ABUNDANT ESCHERICHIA COLI MODERATE KLEBSIELLA PNEUMONIAE    Report Status PENDING  Incomplete   Organism ID, Bacteria ESCHERICHIA COLI  Final   Organism ID, Bacteria KLEBSIELLA PNEUMONIAE  Final      Susceptibility   Escherichia coli - MIC*    AMPICILLIN 4 SENSITIVE Sensitive     CEFAZOLIN <=4 SENSITIVE Sensitive     CEFEPIME <=0.12 SENSITIVE Sensitive  CEFTAZIDIME <=1 SENSITIVE Sensitive     CEFTRIAXONE <=0.25 SENSITIVE Sensitive     CIPROFLOXACIN <=0.25 SENSITIVE Sensitive     GENTAMICIN <=1 SENSITIVE Sensitive     IMIPENEM <=0.25 SENSITIVE Sensitive     TRIMETH/SULFA <=20 SENSITIVE Sensitive     AMPICILLIN/SULBACTAM <=2 SENSITIVE Sensitive     PIP/TAZO <=4 SENSITIVE Sensitive     * ABUNDANT ESCHERICHIA COLI   Klebsiella pneumoniae - MIC*    AMPICILLIN >=32 RESISTANT Resistant     CEFAZOLIN <=4 SENSITIVE Sensitive     CEFEPIME <=0.12 SENSITIVE Sensitive     CEFTAZIDIME <=1 SENSITIVE Sensitive     CEFTRIAXONE <=0.25 SENSITIVE Sensitive     CIPROFLOXACIN <=0.25 SENSITIVE Sensitive     GENTAMICIN <=1 SENSITIVE Sensitive     IMIPENEM <=0.25 SENSITIVE Sensitive     TRIMETH/SULFA <=20 SENSITIVE Sensitive     AMPICILLIN/SULBACTAM 4 SENSITIVE Sensitive     PIP/TAZO <=4 SENSITIVE Sensitive     * MODERATE KLEBSIELLA PNEUMONIAE    Coagulation Studies: Recent Labs    03/20/19 0819  LABPROT 13.8  INR 1.1    Urinalysis: No results for  input(s): COLORURINE, LABSPEC, PHURINE, GLUCOSEU, HGBUR, BILIRUBINUR, KETONESUR, PROTEINUR, UROBILINOGEN, NITRITE, LEUKOCYTESUR in the last 72 hours.  Invalid input(s): APPERANCEUR    Imaging: No results found.   Medications:   . sodium chloride 250 mL (03/18/19 1635)  . albumin human    . feeding supplement (OSMOLITE 1.5 CAL) 1,000 mL (03/20/19 0546)  . vancomycin 750 mg (03/18/19 1636)   . aspirin  81 mg Per Tube Daily  . B-complex with vitamin C  1 tablet Per Tube Daily  . Chlorhexidine Gluconate Cloth  6 each Topical Q0600  . clotrimazole  1 Applicatorful Vaginal QHS  . epoetin (EPOGEN/PROCRIT) injection  10,000 Units Intravenous Q M,W,F-HD  . feeding supplement (PRO-STAT SUGAR FREE 64)  30 mL Per Tube Daily  . fluticasone  2 spray Each Nare Daily  . free water  20 mL Per Tube Q4H  . insulin aspart  0-15 Units Subcutaneous Q4H  . levothyroxine  100 mcg Per Tube QAC breakfast  . mouth rinse  15 mL Mouth Rinse BID  . pantoprazole (PROTONIX) IV  40 mg Intravenous Q12H  . psyllium  1 packet Per Tube TID  . venlafaxine  75 mg Per Tube BID WC  . zinc sulfate  220 mg Per Tube Daily   sodium chloride, acetaminophen, ipratropium-albuterol, ondansetron (ZOFRAN) IV, traMADol  Assessment/ Plan:  [name] is a 64 y.o. white female with end-stage renal disease on hemodialysis, atrial fibrillation, chronic diastolic congestive heart failure, coronary artery disease status post CABG, diabetes mellitus type II, hypertension, hyperlipidemia, hypothyroidism, Covid positive in July 2020, recent nursing home stay in [place] Prolonged hospitalization for MRSA bacteremia/sepsis and now with acute encephalopathy  [facility] MWF [facility] [address] Left AVG 81.5kg  1. End Stage Renal Disease: on hemodialysis. With complication of dialysis device. Right arm AVG removed 12/30 due to suspected source of bacteremia Now with left IJ permcath - Continue MWF schedule.  Dialysis for later today.   - albumin with HD treatments.    2. Hypotension -Continue midodrine 10 mg 3 times daily.  3. Anemia of chronic kidney disease:   EPO with HD treatments.   4. Secondary Hyperparathyroidism: PTH 139 (low) on 02/06/2019 Phosphorus low on last check calcium within goal.  Not on binders.   5. Acute Encephalopathy: No significant change in mental status.   Subjective improvements since  beginning of the week according to husband - Appreciate Neurology input. Showed some improvement between both EEGs - Thiamine - LP for today.    LOS: [place] 1/15/202111:42 AM

## 2019-03-20 NOTE — Progress Notes (Signed)
Pt stable after lp.Back stable.No swelling or bleeding.Pt sent back to floor in stable condition.F/u with her M.D.Thankyou.

## 2019-03-20 NOTE — Progress Notes (Signed)
This note also relates to the following rows which could not be included: Resp - Cannot attach notes to unvalidated device data BP - Cannot attach notes to unvalidated device data  Hd started  

## 2019-03-20 NOTE — Progress Notes (Signed)
ID Status Quo Had LP and csf revealed 8 wbc and 75 protein Greenish vaginal discharge as per her nurse As E.coli and kleb in the vaginal culture done by ED will treat as concern for endometritis eventhough these organisms could be stool contamination May need pelvic ultrasoun

## 2019-03-20 NOTE — Consult Note (Signed)
Pharmacy Antibiotic Note  Jamie Arias is a 64 y.o. female admitted on 02/09/2019 with methicillin resistant Staphylococcus aureus bacteremia.  Pharmacy has been consulted for vancomycin dosing (previous antibiotics included daptomycin and ceftaroline) She recently underwent AVG removal on 03/04/19.  She will require IV vancomycin until 04/01/19 with goal pre-HD vancomycin levels 20 - 25 mcg/mL based on recommendations by Dr Delaine [name]. There have been no missed doses since the previous note; next dose today with HD  Vancomycin Levels (pre-HD):  03/06/19 0345 24 mcg/mL 03/09/19 0528 20 mcg/mL  03/11/19 0502 20 mcg/mL 03/18/19 0622 20 mcg/mL    Plan:  continue vancomycin 750 mg IV qHD MWF  follow levels once weekly unless otherwise clinically indicated  next vancomycin level 1/20 prior to dialysis.  Height: 5' 4.02" (162.6 cm) Weight: 185 lb 13.6 oz (84.3 kg) IBW/kg (Calculated) : 54.74  Temp (24hrs), Avg:98.6 F (37 C), Min:98.5 F (36.9 C), Max:98.9 F (37.2 C)  Recent Labs  Lab 03/16/19 0832 03/16/19 0832 03/17/19 0549 03/18/19 0621 03/18/19 0622 03/18/19 1001 03/19/19 0449 03/19/19 0633 03/20/19 0539  WBC 7.9   < > 6.4 5.7  --  5.6  --  4.6 4.4  CREATININE 3.44*  --  2.30* 2.99*  --  1.75* 2.16*  --   --   VANCORANDOM  --   --   --   --  20  --   --   --   --    < > = values in this interval not displayed.    Estimated Creatinine Clearance: 27.3 mL/min (A) (by C-G formula based on SCr of 2.16 mg/dL (H)).    Antimicrobials this admission: ceftaroline 12/14 >> 12/21 daptomycin 12/14 >> 12/26 Vancomycin 12/24 >>  Microbiology results: 12/30 Aerobic/Anaerobic Cx:  NG 12/16 BCx: NG 12/15 WCx MRSA 12/13 BCx: MRSA  12/11 BCx: MRSA  Thank you for allowing pharmacy to be a part of this patient's care.  Vallery Sa, PharmD Clinical Pharmacist 03/20/2019 8:06 AM

## 2019-03-20 NOTE — Progress Notes (Signed)
PROGRESS NOTE    Jamie Arias  [id number] DOB: December 15, 1955 DOA: 02/09/2019 PCP: [name], MD   Brief Narrative:  Jamie Arias is a 64 y.o. white female with end-stage renal disease on hemodialysis, atrial fibrillation, chronic diastolic congestive heart failure, coronary artery disease status post CABG, diabetes mellitus type II, hypertension, hyperlipidemia, hypothyroidism, Covid positive in July 2020, recent nursing home stay in [place]. Prolonged hospitalization for MRSA bacteremia/sepsis and now with acute encephalopathy.  Subjective: Lying on bed this morning was a staring at first.  Then responded to my verbal command and looked at me.  However did not make any sound.  Assessment & Plan:   Principal Problem:   Sepsis due to methicillin resistant Staphylococcus aureus (MRSA) ([place]) Active Problems:   Type 2 diabetes mellitus with ESRD (end-stage renal disease) (HCC)   Atrial fibrillation, chronic (HCC)   Hypotension   Chronic diastolic CHF (congestive heart failure) (HCC)   ESRD (end stage renal disease) (HCC)   Altered level of consciousness   Decubitus ulcer of heel, bilateral, stage 2 (HCC)   MRSA bacteremia   Arm DVT (deep venous thromboembolism), acute, left (HCC)   Acute encephalopathy   Acute metabolic encephalopathy   Acute blood loss anemia   AF (paroxysmal atrial fibrillation) ([place])   Diarrhea   Oropharyngeal dysphagia   Abscess  Sepsis secondary to MRSA bacteremia.  No obvious source.  Right arm graft was removed on 03/04/2019. -Continue with vancomycin for 6-week, will finish her course on 04/01/2019.         Acute metabolic encephalopathy:  mental status remains unchanged -  MRI of brain and EEG negative. Patient remained afebrile with no leukocytosis.  B12 within normal range, folate and B1 results are pending.  -  She was started on thiamine challenge by neurology but not much improvement. - Because of persistent encephalopathy - Neuro  recommends LP   - she needs to be off Eliquis for 48 hrs and off of heparin - IR to perform LP on 03/20/19 AM. Heparin to be stopped/ held per IR recommendation -Resume heparin drip with the procedure per IR -Appreciate IR assistance - Repeat TSH was elevated. - Increased dose of Synthroid to 100 mcg -Overall prognosis seems poor.  -Palliative care consulted.  They have been following the patient and in touch with husband.  In the meantime continue medical treatment.  Bleeding from catheter site with multiple ecchymosis.  No new episode over the past 24 to 48 hours -  warfarin was discontinued due to concern for necrosis of skin.  Hemoglobin dropped to 6.8; improved to 8.7 and remains mostly above 8 -Holding Eliquis & switch over to Heparin drip   Vaginal discharge.  There was some nursing concern of vaginal discharge  -there is some yeast but Gyn feels this may be colonization - may not need treatment. She is not symptomatic  Anemia.  Status post transfusion 1 unit of PRBC on 03/17/2019 -  hemoglobin stable above 8.  Iron studies consistent with anemia of chronic illness.  MCV elevated.  -B12 within normal range -Continue EPO with dialysis.  Hypotension.  Resolved now  ESRD (M [facility]F).   Currently using tunneled catheter as graft was removed due to MRSA bacteremia. -Continue with scheduled dialysis per nephrology  Left arm DVT.  Patient was on Heparin.  Recent diagnosis of left arm DVT on 03/03/2019. -Discontinue Coumadin due to concern for Coumadin toxicity/side effect due to her skin necrotic lesions. -holding Eliquis  for LP and switched over to Heparin drip  Hypothyroidism.  Repeat TSH elevated at 17.82. -Increased the dose of Synthroid to 100. - repeat TSH in 2 to 3 weeks.  Paroxysmal A. Fib.. Currently in sinus. Amiodarone was discontinued. -Holding Eliquis for LP and started heparin drip  Type 2 diabetes. -Continue SSI.  Chronic diastolic heart failure.  Volume being managed  by dialysis.  Bilateral decubitus ulcers.  Present on admission. -Continue with wound care.  Nutrition.  PEG placed 03/03/2019.  Continue tube feeding.  Acute hypoxic respiratory failure.  Resolved.  Patient was intubated initially, extubated on 03/05/2019. -On room air now  Objective: Vitals:   03/19/19 1202 03/19/19 1955 03/20/19 0338 03/20/19 1155  BP: (!) 135/46 (!) 131/40 (!) 122/55 135/77  Pulse: 79 80 84 87  Resp: 16 20 14 16   Temp: 98.9 F (37.2 C) 98.5 F (36.9 C) 98.5 F (36.9 C) 98.7 F (37.1 C)  TempSrc: Oral Oral Oral Oral  SpO2: 99% 100% 99% 98%  Weight:      Height:        Intake/Output Summary (Last 24 hours) at 03/20/2019 1258 Last data filed at 03/20/2019 1000 Gross per 24 hour  Intake 1485.89 ml  Output 600 ml  Net 885.89 ml   Filed Weights   03/16/19 1400 03/18/19 0850 03/18/19 1206  Weight: 86.5 kg 88.4 kg 84.3 kg    Examination:  General exam: Chronically ill-appearing lady, minimal response to painful stimuli, in no acute distress. Respiratory system: Clear to auscultation. Respiratory effort normal. Cardiovascular system: S1 & S2 heard, RRR. No JVD, murmurs, rubs, gallops or clicks. Gastrointestinal system: Soft, nontender, nondistended, bowel sounds positive. Central nervous system: Unable to assess as patient is minimally responsive and not following any commands. Extremities: Bilateral upper extremity edema, no cyanosis, pulses intact and symmetrical. Skin: Decubitus ulcers on both heels.  Multiple ecchymosis on all extremities and upper chest, appears healing now. Psychiatry: Judgement and insight appear impaired.  DVT prophylaxis: Eliquis Code Status: DNR Family Communication: Husband was updated at bedside. Disposition Plan: Waiting for bed offer at [facility]  Consultants:   Nephrology  Infectious disease  Vascular surgery  Neurology  Critical care specialist   Procedures:  Antimicrobials:  Vancomycin.  Data  Reviewed: I have personally reviewed following labs and imaging studies  CBC: Recent Labs  Lab 03/18/19 0621 03/18/19 1001 03/19/19 0633 03/20/19 0539 03/20/19 1215  WBC 5.7 5.6 4.6 4.4 5.0  HGB 8.2* 8.7* 8.7* 7.8* 8.3*  HCT 25.3* 26.9* 27.1* 24.7* 26.4*  MCV 95.8 95.4 96.8 96.9 99.2  PLT 220 203 228 235 [id number]   Basic Metabolic Panel: Recent Labs  Lab 03/14/19 0502 03/14/19 0502 03/15/19 0509 03/15/19 0509 03/16/19 0832 03/16/19 0832 03/17/19 0549 03/18/19 0621 03/18/19 1001 03/19/19 0449 03/20/19 0819  NA 138   < > 138   < > 136   < > 137 137 136 136 136  K 2.8*   < > 3.8   < > 4.6   < > 4.0 4.2 3.2* 4.1 4.3  CL 104   < > 101   < > 101   < > 101 100 98 101 101  CO2 30   < > 26   < > 25   < > 29 28 29 29 26   GLUCOSE 171*   < > 203*   < > 175*   < > 163* 170* 156* 154* 143*  BUN 21   < > 34*   < >  50*   < > 33* 43* 24* 28* 43*  CREATININE 1.90*   < > 2.70*   < > 3.44*   < > 2.30* 2.99* 1.75* 2.16* 3.02*  CALCIUM 8.1*   < > 8.1*   < > 7.9*   < > 7.9* 8.1* 7.9* 7.9* 8.3*  MG  --   --   --   --   --   --   --   --  1.7  --   --   PHOS 2.0*  --  4.0  --  4.9*  --   --   --  2.4*  --   --    < > = values in this interval not displayed.   GFR: Estimated Creatinine Clearance: 19.5 mL/min (A) (by C-G formula based on SCr of 3.02 mg/dL (H)). Liver Function Tests: Recent Labs  Lab 03/14/19 0502 03/15/19 0509 03/16/19 0832 03/18/19 1001  ALBUMIN 1.8* 1.8* 1.7* 1.8*   No results for input(s): LIPASE, AMYLASE in the last 168 hours. No results for input(s): AMMONIA in the last 168 hours. Coagulation Profile: Recent Labs  Lab 03/20/19 0819  INR 1.1   Cardiac Enzymes: No results for input(s): CKTOTAL, CKMB, CKMBINDEX, TROPONINI in the last 168 hours. BNP (last 3 results) No results for input(s): PROBNP in the last 8760 hours. HbA1C: No results for input(s): HGBA1C in the last 72 hours. CBG: Recent Labs  Lab 03/19/19 2013 03/19/19 2257 03/20/19 0336 03/20/19 0800  03/20/19 1154  GLUCAP 128* 146* 183* 147* 108*   Lipid Profile: No results for input(s): CHOL, HDL, LDLCALC, TRIG, CHOLHDL, LDLDIRECT in the last 72 hours. Thyroid Function Tests: No results for input(s): TSH, T4TOTAL, FREET4, T3FREE, THYROIDAB in the last 72 hours. Anemia Panel: Recent Labs    03/19/19 0633  FOLATE 4.5*   Sepsis Labs: No results for input(s): PROCALCITON, LATICACIDVEN in the last 168 hours.  Recent Results (from the past 240 hour(s))  Wet prep, genital     Status: Abnormal   Collection Time: 03/16/19 10:01 AM  Result Value Ref Range Status   Yeast Wet Prep HPF POC PRESENT (A) NONE SEEN Final   Trich, Wet Prep NONE SEEN NONE SEEN Final   Clue Cells Wet Prep HPF POC NONE SEEN NONE SEEN Final   WBC, Wet Prep HPF POC MANY (A) NONE SEEN Final   Sperm NONE SEEN  Final    Comment: Performed at [facility], [address], [place], [place] [address]  Aerobic Culture (superficial specimen)     Status: None (Preliminary result)   Collection Time: 03/17/19  5:13 PM   Specimen: Vaginal Fluid  Result Value Ref Range Status   Specimen Description   Final    VAGINA Performed at [facility], [address] [place], [place] [address]    Special Requests   Final    NONE Performed at [facility], [address] [place] [place], [place] [address]    Gram Stain   Final    MODERATE WBC PRESENT,BOTH PMN AND MONONUCLEAR FEW GRAM POSITIVE COCCI IN PAIRS FEW GRAM NEGATIVE RODS Performed at [facility], [place] [address], [place],  [address]    Culture   Final    ABUNDANT ESCHERICHIA COLI MODERATE KLEBSIELLA PNEUMONIAE    Report Status PENDING  Incomplete   Organism ID, Bacteria ESCHERICHIA COLI  Final   Organism ID, Bacteria KLEBSIELLA PNEUMONIAE  Final      Susceptibility   Escherichia coli -  MIC*    AMPICILLIN 4 SENSITIVE Sensitive     CEFAZOLIN <=4 SENSITIVE Sensitive     CEFEPIME <=0.12 SENSITIVE Sensitive     CEFTAZIDIME <=1  SENSITIVE Sensitive     CEFTRIAXONE <=0.25 SENSITIVE Sensitive     CIPROFLOXACIN <=0.25 SENSITIVE Sensitive     GENTAMICIN <=1 SENSITIVE Sensitive     IMIPENEM <=0.25 SENSITIVE Sensitive     TRIMETH/SULFA <=20 SENSITIVE Sensitive     AMPICILLIN/SULBACTAM <=2 SENSITIVE Sensitive     PIP/TAZO <=4 SENSITIVE Sensitive     * ABUNDANT ESCHERICHIA COLI   Klebsiella pneumoniae - MIC*    AMPICILLIN >=32 RESISTANT Resistant     CEFAZOLIN <=4 SENSITIVE Sensitive     CEFEPIME <=0.12 SENSITIVE Sensitive     CEFTAZIDIME <=1 SENSITIVE Sensitive     CEFTRIAXONE <=0.25 SENSITIVE Sensitive     CIPROFLOXACIN <=0.25 SENSITIVE Sensitive     GENTAMICIN <=1 SENSITIVE Sensitive     IMIPENEM <=0.25 SENSITIVE Sensitive     TRIMETH/SULFA <=20 SENSITIVE Sensitive     AMPICILLIN/SULBACTAM 4 SENSITIVE Sensitive     PIP/TAZO <=4 SENSITIVE Sensitive     * MODERATE KLEBSIELLA PNEUMONIAE     Radiology Studies: No results found.  Scheduled Meds: . aspirin  81 mg Per Tube Daily  . B-complex with vitamin C  1 tablet Per Tube Daily  . Chlorhexidine Gluconate Cloth  6 each Topical Q0600  . clotrimazole  1 Applicatorful Vaginal QHS  . epoetin (EPOGEN/PROCRIT) injection  10,000 Units Intravenous Q M,W,F-HD  . feeding supplement (PRO-STAT SUGAR FREE 64)  30 mL Per Tube Daily  . fluticasone  2 spray Each Nare Daily  . free water  20 mL Per Tube Q4H  . insulin aspart  0-15 Units Subcutaneous Q4H  . levothyroxine  100 mcg Per Tube QAC breakfast  . mouth rinse  15 mL Mouth Rinse BID  . pantoprazole (PROTONIX) IV  40 mg Intravenous Q12H  . psyllium  1 packet Per Tube TID  . venlafaxine  75 mg Per Tube BID WC  . zinc sulfate  220 mg Per Tube Daily   Continuous Infusions: . sodium chloride 250 mL (03/18/19 1635)  . albumin human    . feeding supplement (OSMOLITE 1.5 CAL) 1,000 mL (03/20/19 0546)  . vancomycin 750 mg (03/18/19 1636)     LOS: 39 days   Time spent: 35 min.   Thornell Mule, MD [facility] Pager [phone]  If 7PM-7AM, please contact night-coverage [email or web address] Password [facility] 03/20/2019, 12:58 PM   This record has been created using Systems analyst. Errors have been sought and corrected,but may not always be located. Such creation errors do not reflect on the standard of care.

## 2019-03-20 NOTE — Progress Notes (Signed)
This note also relates to the following rows which could not be included: Pulse Rate - Cannot attach notes to unvalidated device data Resp - Cannot attach notes to unvalidated device data BP - Cannot attach notes to unvalidated device data  Hd completed  

## 2019-03-20 NOTE — Consult Note (Signed)
Pharmacy Antibiotic Note  Jamie Arias is a 64 y.o. female admitted on 02/09/2019 with methicillin resistant Staphylococcus aureus bacteremia.  Pharmacy has been consulted for vancomycin dosing (previous antibiotics included daptomycin and ceftaroline) She recently underwent AVG removal on 03/04/19.  She will require IV vancomycin until 04/01/19 with goal pre-HD vancomycin levels 20 - 25 mcg/mL based on recommendations by Dr Delaine [name]. There have been no missed doses since the previous note; next dose today with HD  Vancomycin Levels (pre-HD):  03/06/19 0345 24 mcg/mL 03/09/19 0528 20 mcg/mL  03/11/19 0502 20 mcg/mL 03/18/19 0622 20 mcg/mL    03/20/19 update: Pharmacy consulted to dose ampicillin/sulbactam for pelvic inflammatory disease by Dr. Delaine [name]  Plan:  Start ampicillin/sulbactam 3 g q24h after HD  continue vancomycin 750 mg IV qHD MWF  follow levels once weekly unless otherwise clinically indicated  next vancomycin level 1/20 prior to dialysis.  Height: 5' 4.02" (162.6 cm) Weight: 185 lb 13.6 oz (84.3 kg) IBW/kg (Calculated) : 54.74  Temp (24hrs), Avg:98.6 F (37 C), Min:98.3 F (36.8 C), Max:98.8 F (37.1 C)  Recent Labs  Lab 03/17/19 0549 03/17/19 0549 03/18/19 0621 03/18/19 0622 03/18/19 1001 03/19/19 0449 03/19/19 0633 03/20/19 0539 03/20/19 0819 03/20/19 1215  WBC 6.4   < > 5.7  --  5.6  --  4.6 4.4  --  5.0  CREATININE 2.30*  --  2.99*  --  1.75* 2.16*  --   --  3.02*  --   VANCORANDOM  --   --   --  20  --   --   --   --   --   --    < > = values in this interval not displayed.    Estimated Creatinine Clearance: 19.5 mL/min (A) (by C-G formula based on SCr of 3.02 mg/dL (H)).    Antimicrobials this admission: ceftaroline 12/14 >> 12/21 daptomycin 12/14 >> 12/26 Vancomycin 12/24 >> Unasyn 1/15 >>  Microbiology results: 1/15 Fungus culture (CSF): pending 1/15 Anaerobic culture (CSF): no organisms seen 1/15 CSF culture: no organisms  seen 1/12 Aerobic culture (vaginal fluid): E coli + K pneumo 1/11 Wet prep: Yeast cells present, many WBC 12/30 Aerobic/Anaerobic Cx:  NG 12/16 BCx: NG 12/15 WCx MRSA 12/13 BCx: MRSA  12/11 BCx: MRSA  Thank you for allowing pharmacy to be a part of this patient's care.  Lockie Mola Clinical Pharmacist 03/20/2019 9:52 PM

## 2019-03-21 LAB — AEROBIC CULTURE W GRAM STAIN (SUPERFICIAL SPECIMEN)

## 2019-03-21 LAB — GLUCOSE, CAPILLARY
Glucose-Capillary: 165 mg/dL — ABNORMAL HIGH (ref 70–99)
Glucose-Capillary: 168 mg/dL — ABNORMAL HIGH (ref 70–99)
Glucose-Capillary: 173 mg/dL — ABNORMAL HIGH (ref 70–99)
Glucose-Capillary: 179 mg/dL — ABNORMAL HIGH (ref 70–99)
Glucose-Capillary: 182 mg/dL — ABNORMAL HIGH (ref 70–99)
Glucose-Capillary: 193 mg/dL — ABNORMAL HIGH (ref 70–99)
Glucose-Capillary: 246 mg/dL — ABNORMAL HIGH (ref 70–99)

## 2019-03-21 LAB — PTT FACTOR INHIBITOR (MIXING STUDY): aPTT: 33.2 s — ABNORMAL HIGH (ref 22.9–30.2)

## 2019-03-21 LAB — CK: Total CK: 26 U/L — ABNORMAL LOW (ref 38–234)

## 2019-03-21 MED ORDER — SODIUM CHLORIDE 0.9 % IV SOLN: 3.0000 g | Freq: Two times a day (BID) | INTRAVENOUS | Status: DC

## 2019-03-21 MED ORDER — FERROUS GLUCONATE 324 (38 FE) MG PO TABS
324.0000 mg | ORAL_TABLET | Freq: Every day | ORAL | Status: DC
  Administered 2019-03-21 – 2019-04-08 (×19): 324 mg via ORAL
  Filled 2019-03-21 (×21): qty 1

## 2019-03-21 MED ORDER — SODIUM CHLORIDE 0.9 % IV SOLN
3.0000 g | Freq: Two times a day (BID) | INTRAVENOUS | Status: AC
  Administered 2019-03-21: 3 g via INTRAVENOUS
  Filled 2019-03-21: qty 3
  Filled 2019-03-21 (×2): qty 8

## 2019-03-21 MED ORDER — SODIUM CHLORIDE 0.9 % IV SOLN
3.0000 g | Freq: Two times a day (BID) | INTRAVENOUS | Status: AC
  Administered 2019-03-22 – 2019-03-23 (×4): 3 g via INTRAVENOUS
  Filled 2019-03-21 (×3): qty 3
  Filled 2019-03-21: qty 8

## 2019-03-21 NOTE — Plan of Care (Signed)
Patient remains disoriented and speech incomprehensible. Lab techs tried to draw blood cultures twice but could not. Patients is edematous and has weeping in BUE. NP notified. Will continue to monitor patient.

## 2019-03-21 NOTE — Consult Note (Addendum)
Pharmacy Antibiotic Note  Jamie Arias is a 64 y.o. female admitted on 02/09/2019 with methicillin resistant Staphylococcus aureus bacteremia.  Pharmacy has been consulted for vancomycin dosing (previous antibiotics included daptomycin and ceftaroline) She recently underwent AVG removal on 03/04/19.  She will require IV vancomycin until 04/01/19 with goal pre-HD vancomycin levels 20 - 25 mcg/mL based on recommendations by Dr Delaine [name]. There have been no missed doses since the previous note; next dose today with HD  Unasyn PTD per Dr. Delaine [name]:  Plan: 01/16 patient's CrCl post Wednesday HD 19.5 ml/min. Will readjust dose for Unasyn 3g IV q12h w/ the second dose to be administered after dialysis on MWF qHD. Will continue to monitor.  Height: 5' 4.02" (162.6 cm) Weight: 185 lb 13.6 oz (84.3 kg) IBW/kg (Calculated) : 54.74  Temp (24hrs), Avg:98.6 F (37 C), Min:98.3 F (36.8 C), Max:98.8 F (37.1 C)  Recent Labs  Lab 03/17/19 0549 03/17/19 0549 03/18/19 0621 03/18/19 0622 03/18/19 1001 03/19/19 0449 03/19/19 0633 03/20/19 0539 03/20/19 0819 03/20/19 1215  WBC 6.4   < > 5.7  --  5.6  --  4.6 4.4  --  5.0  CREATININE 2.30*  --  2.99*  --  1.75* 2.16*  --   --  3.02*  --   VANCORANDOM  --   --   --  20  --   --   --   --   --   --    < > = values in this interval not displayed.    Estimated Creatinine Clearance: 19.5 mL/min (A) (by C-G formula based on SCr of 3.02 mg/dL (H)).    Antimicrobials this admission: ceftaroline 12/14 >> 12/21 daptomycin 12/14 >> 12/26 Vancomycin 12/24 >> Unasyn 1/15 >>  Microbiology results: 1/15 Fungus culture (CSF): pending 1/15 Anaerobic culture (CSF): no organisms seen 1/15 CSF culture: no organisms seen 1/12 Aerobic culture (vaginal fluid): E coli + K pneumo 1/11 Wet prep: Yeast cells present, many WBC 12/30 Aerobic/Anaerobic Cx:  NG 12/16 BCx: NG 12/15 WCx MRSA 12/13 BCx: MRSA  12/11 BCx: MRSA  Thank you for allowing pharmacy to  be a part of this patient's care.  Tobie Lords, PharmD, BCPS Clinical Pharmacist 03/21/2019 4:22 AM

## 2019-03-21 NOTE — Progress Notes (Signed)
PROGRESS NOTE    Jamie Arias  [id number] DOB: 06/21/1955 DOA: 02/09/2019 PCP: [name], MD   Brief Narrative:  [name] is a 64 y.o. white female with end-stage renal disease on hemodialysis, atrial fibrillation, chronic diastolic congestive heart failure, coronary artery disease status post CABG, diabetes mellitus type II, hypertension, hyperlipidemia, hypothyroidism, Covid positive in July 2020, recent nursing home stay in [place]. Prolonged hospitalization for MRSA bacteremia/sepsis and now with acute encephalopathy.  Subjective: Patient was lying on bed.  When called her name a couple of times she looked at the source of sound.  Grimaces expressed when try to rub the sternum area.  No fever overnight.  Assessment & Plan:   Principal Problem:   Sepsis due to methicillin resistant Staphylococcus aureus (MRSA) ([place]) Active Problems:   Type 2 diabetes mellitus with ESRD (end-stage renal disease) (HCC)   Atrial fibrillation, chronic (HCC)   Hypotension   Chronic diastolic CHF (congestive heart failure) (HCC)   ESRD (end stage renal disease) (HCC)   Altered level of consciousness   Decubitus ulcer of heel, bilateral, stage 2 (HCC)   MRSA bacteremia   Arm DVT (deep venous thromboembolism), acute, left (HCC)   Acute encephalopathy   Acute metabolic encephalopathy   Acute blood loss anemia   AF (paroxysmal atrial fibrillation) ([place])   Diarrhea   Oropharyngeal dysphagia   Abscess  Sepsis secondary to MRSA bacteremia.  No obvious source.  Right arm graft was removed on 03/04/2019. -Continue with vancomycin for 6-week, will finish her course on 04/01/2019.       -Repeat blood culture from 03/21/2019 pending  Acute metabolic encephalopathy:  mental status remains unchanged -  MRI of brain and EEG negative. Patient remained afebrile with no leukocytosis.  B12 within normal range, folate and B1 results are pending.  -  She was started on thiamine challenge by  neurology but not much improvement. - Because of persistent encephalopathy - Neuro recommends LP   - she needs to be off Eliquis for 48 hrs and off of heparin - IR  performed LP on 03/20/19 AM: CSF shows only 8 WBC, Protein elevated at 73, Glucose 83. -CSF panel is a send out; will follow result -Appreciate IR assistance - Repeat TSH was elevated. - Increased dose of Synthroid to 100 mcg -Overall prognosis seems poor.  -Palliative care consulted.  They have been following the patient and in touch with husband.  In the meantime continue medical treatment. -Repeat blood culture from 03/21/2019 pending  Bleeding from catheter site with multiple ecchymosis.  No new episode over the past 24 to 48 hours -  warfarin was discontinued due to concern for necrosis of skin.  Hemoglobin dropped to 6.8; improved to 8.7 and remains mostly above 8 -Holding Eliquis & switch over to Heparin drip   Vaginal discharge.  There was some nursing concern of vaginal discharge  -there is some yeast but Gyn feels this may be colonization - may not need treatment. She is not symptomatic -Since culture grew E. coli and Klebsiella; contamination questionable  - Unasyn also started on 03/21/2019 per ID -Repeat blood culture pending  Anemia.  Status post transfusion 1 unit of PRBC on 03/17/2019 -  hemoglobin stable above 8.  Iron studies consistent with anemia of chronic illness.  MCV elevated.  -B12 within normal range -Continue EPO with dialysis.  Hypotension.  Resolved now  ESRD (M WF).   Currently using tunneled catheter as graft was removed due  to MRSA bacteremia. -Continue with scheduled dialysis per nephrology  Left arm DVT.  Currently on Eliquis - recent diagnosis of left arm DVT on 03/03/2019. -Discontinue Coumadin due to concern for Coumadin toxicity/side effect due to her skin necrotic lesions.  Hypothyroidism.  Repeat TSH elevated at 17.82. -Increased the dose of Synthroid to 100. - repeat TSH in 2 to 3  weeks.  Paroxysmal A. Fib.. Currently in sinus. Amiodarone was discontinued. -Continue Eliquis  Type 2 diabetes. -Continue SSI.  Chronic diastolic heart failure.  Volume being managed by dialysis.  Bilateral decubitus ulcers.  Present on admission. -Continue with wound care.  Nutrition.  PEG placed 03/03/2019.  Continue tube feeding.  Acute hypoxic respiratory failure.  Resolved.  Patient was intubated initially, extubated on 03/05/2019. -On room air now  Objective: Vitals:   03/20/19 1944 03/21/19 0409 03/21/19 0427 03/21/19 1213  BP: (!) 142/55 (!) 160/56 (!) 153/58 (!) 141/49  Pulse: 90 88 90 85  Resp: 17 18 20 18   Temp: 98.8 F (37.1 C) 99 F (37.2 C) 99.2 F (37.3 C) 99.3 F (37.4 C)  TempSrc: Axillary Oral Oral Axillary  SpO2: 97% 100% 99% 99%  Weight:      Height:        Intake/Output Summary (Last 24 hours) at 03/21/2019 1406 Last data filed at 03/21/2019 0450 Gross per 24 hour  Intake 440 ml  Output 2450 ml  Net -2010 ml   Filed Weights   03/16/19 1400 03/18/19 0850 03/18/19 1206  Weight: 86.5 kg 88.4 kg 84.3 kg    Examination:  General exam: Chronically ill-appearing lady, minimal response to painful stimuli, in no acute distress. Respiratory system: Clear to auscultation. Respiratory effort normal. Cardiovascular system: S1 & S2 heard, RRR. No JVD, murmurs, rubs, gallops or clicks. Gastrointestinal system: Soft, nontender, nondistended, bowel sounds positive. Central nervous system: Unable to assess as patient is minimally responsive and not following any commands. Extremities: Bilateral upper extremity edema, no cyanosis, pulses intact and symmetrical. Skin: Decubitus ulcers on both heels.  Multiple ecchymosis on all extremities and upper chest, appears healing now. Psychiatry: Judgement and insight appear impaired.  DVT prophylaxis: Eliquis Code Status: DNR Family Communication: Husband was updated at bedside. Disposition Plan: Waiting for bed  offer at [facility]  Consultants:   Nephrology  Infectious disease  Vascular surgery  Neurology  Critical care specialist   Procedures:  Antimicrobials:  Vancomycin.  Data Reviewed: I have personally reviewed following labs and imaging studies  CBC: Recent Labs  Lab 03/18/19 0621 03/18/19 1001 03/19/19 0633 03/20/19 0539 03/20/19 1215  WBC 5.7 5.6 4.6 4.4 5.0  HGB 8.2* 8.7* 8.7* 7.8* 8.3*  HCT 25.3* 26.9* 27.1* 24.7* 26.4*  MCV 95.8 95.4 96.8 96.9 99.2  PLT 220 203 228 235 [id number]   Basic Metabolic Panel: Recent Labs  Lab 03/15/19 0509 03/15/19 0509 03/16/19 0832 03/16/19 0832 03/17/19 0549 03/18/19 0621 03/18/19 1001 03/19/19 0449 03/20/19 0819  NA 138   < > 136   < > 137 137 136 136 136  K 3.8   < > 4.6   < > 4.0 4.2 3.2* 4.1 4.3  CL 101   < > 101   < > 101 100 98 101 101  CO2 26   < > 25   < > 29 28 29 29 26   GLUCOSE 203*   < > 175*   < > 163* 170* 156* 154* 143*  BUN 34*   < > 50*   < >  33* 43* 24* 28* 43*  CREATININE 2.70*   < > 3.44*   < > 2.30* 2.99* 1.75* 2.16* 3.02*  CALCIUM 8.1*   < > 7.9*   < > 7.9* 8.1* 7.9* 7.9* 8.3*  MG  --   --   --   --   --   --  1.7  --   --   PHOS 4.0  --  4.9*  --   --   --  2.4*  --   --    < > = values in this interval not displayed.   GFR: Estimated Creatinine Clearance: 19.5 mL/min (A) (by C-G formula based on SCr of 3.02 mg/dL (H)). Liver Function Tests: Recent Labs  Lab 03/15/19 0509 03/16/19 0832 03/18/19 1001  ALBUMIN 1.8* 1.7* 1.8*   No results for input(s): LIPASE, AMYLASE in the last 168 hours. No results for input(s): AMMONIA in the last 168 hours. Coagulation Profile: Recent Labs  Lab 03/20/19 0819  INR 1.1   Cardiac Enzymes: No results for input(s): CKTOTAL, CKMB, CKMBINDEX, TROPONINI in the last 168 hours. BNP (last 3 results) No results for input(s): PROBNP in the last 8760 hours. HbA1C: No results for input(s): HGBA1C in the last 72 hours. CBG: Recent Labs  Lab 03/20/19 2321  03/21/19 0405 03/21/19 0424 03/21/19 0746 03/21/19 1210  GLUCAP 199* 165* 182* 173* 246*   Lipid Profile: No results for input(s): CHOL, HDL, LDLCALC, TRIG, CHOLHDL, LDLDIRECT in the last 72 hours. Thyroid Function Tests: No results for input(s): TSH, T4TOTAL, FREET4, T3FREE, THYROIDAB in the last 72 hours. Anemia Panel: Recent Labs    03/19/19 0633  FOLATE 4.5*   Sepsis Labs: No results for input(s): PROCALCITON, LATICACIDVEN in the last 168 hours.  Recent Results (from the past 240 hour(s))  Wet prep, genital     Status: Abnormal   Collection Time: 03/16/19 10:01 AM  Result Value Ref Range Status   Yeast Wet Prep HPF POC PRESENT (A) NONE SEEN Final   Trich, Wet Prep NONE SEEN NONE SEEN Final   Clue Cells Wet Prep HPF POC NONE SEEN NONE SEEN Final   WBC, Wet Prep HPF POC MANY (A) NONE SEEN Final   Sperm NONE SEEN  Final    Comment: Performed at [facility], [address] [place], [place] [address]  Aerobic Culture (superficial specimen)     Status: None   Collection Time: 03/17/19  5:13 PM   Specimen: Vaginal Fluid  Result Value Ref Range Status   Specimen Description   Final    VAGINA Performed at [facility], [address] [place], [place] [address]    Special Requests   Final    NONE Performed at [facility], [address] [address] [place], [place] [address]    Gram Stain   Final    MODERATE WBC PRESENT,BOTH PMN AND MONONUCLEAR FEW GRAM POSITIVE COCCI IN PAIRS FEW GRAM NEGATIVE RODS Performed at [facility], [place] [address], [place], [place] [address]    Culture   Final    ABUNDANT ESCHERICHIA COLI MODERATE KLEBSIELLA PNEUMONIAE    Report Status 03/21/2019 FINAL  Final   Organism ID, Bacteria ESCHERICHIA COLI  Final   Organism ID, Bacteria KLEBSIELLA PNEUMONIAE  Final      Susceptibility   Escherichia coli - MIC*    AMPICILLIN 4 SENSITIVE Sensitive     CEFAZOLIN <=4 SENSITIVE Sensitive     CEFEPIME <=0.12 SENSITIVE  Sensitive  CEFTAZIDIME <=1 SENSITIVE Sensitive     CEFTRIAXONE <=0.25 SENSITIVE Sensitive     CIPROFLOXACIN <=0.25 SENSITIVE Sensitive     GENTAMICIN <=1 SENSITIVE Sensitive     IMIPENEM <=0.25 SENSITIVE Sensitive     TRIMETH/SULFA <=20 SENSITIVE Sensitive     AMPICILLIN/SULBACTAM <=2 SENSITIVE Sensitive     PIP/TAZO <=4 SENSITIVE Sensitive     * ABUNDANT ESCHERICHIA COLI   Klebsiella pneumoniae - MIC*    AMPICILLIN >=32 RESISTANT Resistant     CEFAZOLIN <=4 SENSITIVE Sensitive     CEFEPIME <=0.12 SENSITIVE Sensitive     CEFTAZIDIME <=1 SENSITIVE Sensitive     CEFTRIAXONE <=0.25 SENSITIVE Sensitive     CIPROFLOXACIN <=0.25 SENSITIVE Sensitive     GENTAMICIN <=1 SENSITIVE Sensitive     IMIPENEM <=0.25 SENSITIVE Sensitive     TRIMETH/SULFA <=20 SENSITIVE Sensitive     AMPICILLIN/SULBACTAM 4 SENSITIVE Sensitive     PIP/TAZO <=4 SENSITIVE Sensitive     * MODERATE KLEBSIELLA PNEUMONIAE  Anaerobic culture     Status: None (Preliminary result)   Collection Time: 03/20/19 12:40 PM   Specimen: PATH Cytology CSF; Cerebrospinal Fluid  Result Value Ref Range Status   Specimen Description   Final    CSF Performed at [facility], [address] [place], [place] [address]    Special Requests   Final    NONE Performed at [facility], [address] [place], [place] [address]    Gram Stain   Final    WBC PRESENT, PREDOMINANTLY MONONUCLEAR NO ORGANISMS SEEN CYTOSPIN SMEAR Performed at [facility], [place] [address] [place], [place] [address]    Culture PENDING  Incomplete   Report Status PENDING  Incomplete  CSF culture     Status: None (Preliminary result)   Collection Time: 03/20/19 12:40 PM   Specimen: PATH Cytology CSF; Cerebrospinal Fluid  Result Value Ref Range Status   Specimen Description   Final    CSF Performed at [facility], [address], [place], [place] [address]    Special Requests   Final    NONE Performed at [facility], [address] [place], [place] [address]    Gram Stain   Final    WBC PRESENT, PREDOMINANTLY MONONUCLEAR NO ORGANISMS SEEN CYTOSPIN SMEAR    Culture   Final    NO GROWTH < 12 HOURS Performed at [facility], [place] [address] [place], [place] [address]    Report Status PENDING  Incomplete     Radiology Studies: DG FL GUIDED LUMBAR PUNCTURE  Result Date: 03/20/2019 CLINICAL DATA:  Altered mental status. EXAM: DIAGNOSTIC LUMBAR PUNCTURE UNDER FLUOROSCOPIC GUIDANCE FLUOROSCOPY TIME:  Fluoroscopy Time:  0 minutes 42 seconds Radiation Exposure Index (if provided by the fluoroscopic device): 20.0 mGy Number of Acquired Spot Images: 1. PROCEDURE: After discussing the risks and benefits of this procedure with the patient's husband informed consent was obtained. Given patient's history of recent anticoagulation the increased risk of bleeding was discussed with the patient's husband. The back was sterilely prepped and draped. Following local anesthesia with 1% lidocaine a 22 gauge needle was advanced into the lumbar thecal sac at the L4-L5 level without complication. Clear CSF was obtained. Four separate tubes containing 2 cc each were obtained. Samples sent to the laboratory for further evaluation. Following the procedure hemostasis achieved. No complications. IMPRESSION: Successful fluoroscopically directed lumbar puncture. Electronically Signed   By: Marcello Moores  Register   On: 03/20/2019 13:35  Scheduled Meds: . alteplase  2 mg Intracatheter Once  . alteplase  2 mg Intracatheter Once  . apixaban  5 mg Oral BID  . aspirin  81 mg Per Tube Daily  . B-complex with vitamin C  1 tablet Per Tube Daily  . Chlorhexidine Gluconate Cloth  6 each Topical Q0600  . clotrimazole  1 Applicatorful Vaginal QHS  . epoetin (EPOGEN/PROCRIT) injection  10,000 Units Intravenous Q M,W,F-HD  . feeding supplement (PRO-STAT SUGAR FREE 64)  30 mL Per Tube Daily  . fluticasone  2 spray Each Nare Daily    . free water  20 mL Per Tube Q4H  . insulin aspart  0-15 Units Subcutaneous Q4H  . levothyroxine  100 mcg Per Tube QAC breakfast  . mouth rinse  15 mL Mouth Rinse BID  . pantoprazole (PROTONIX) IV  40 mg Intravenous Q12H  . psyllium  1 packet Per Tube TID  . zinc sulfate  220 mg Per Tube Daily   Continuous Infusions: . sodium chloride 250 mL (03/18/19 1635)  . ampicillin-sulbactam (UNASYN) IV    . feeding supplement (OSMOLITE 1.5 CAL) 1,000 mL (03/20/19 0546)  . vancomycin Stopped (03/20/19 1849)     LOS: 40 days   Time spent: 35 min.   Thornell Mule, MD [facility] Pager [phone]  If 7PM-7AM, please contact night-coverage [email or web address] Password [facility] 03/21/2019, 2:06 PM   This record has been created using Systems analyst. Errors have been sought and corrected,but may not always be located. Such creation errors do not reflect on the standard of care.

## 2019-03-21 NOTE — Progress Notes (Signed)
Reason for consult: Persistent encephalopathy   Subjective: No change in exam, no acute events.    ROS: Unable to obtain due to poor mental status  Examination  Vital signs in last 24 hours: Temp:  [98.3 F (36.8 C)-99.2 F (37.3 C)] 99.2 F (37.3 C) (01/16 0427) Pulse Rate:  [83-91] 90 (01/16 0427) Resp:  [12-21] 20 (01/16 0427) BP: (91-160)/(41-81) 153/58 (01/16 0427) SpO2:  [97 %-100 %] 99 % (01/16 0427)  General: lying in bed, obese CVS: pulse-normal rate and rhythm RS: breathing comfortably Extremities: normal   Neuro: MS: Arousable, states "oh God" to noxious stimuli, tracks examiner, not following any commands CN: pupils equal and reactive,  EOMI ( no disconjugate gaze, nystagmus seen)  face symmetric, tongue midline, normal sensation over face, Motor: Withdraws to noxious stimuli in B/L UE, minimal withdrawal in both LE, Increased tone in both upper extremities and lower extremities Pantars: extensor   Basic Metabolic Panel: Recent Labs  Lab 03/15/19 0509 03/15/19 0509 03/16/19 0832 03/16/19 0832 03/17/19 0549 03/17/19 0549 03/18/19 0621 03/18/19 0621 03/18/19 1001 03/19/19 0449 03/20/19 0819  NA 138   < > 136   < > 137  --  137  --  136 136 136  K 3.8   < > 4.6   < > 4.0  --  4.2  --  3.2* 4.1 4.3  CL 101   < > 101   < > 101  --  100  --  98 101 101  CO2 26   < > 25   < > 29  --  28  --  29 29 26   GLUCOSE 203*   < > 175*   < > 163*  --  170*  --  156* 154* 143*  BUN 34*   < > 50*   < > 33*  --  43*  --  24* 28* 43*  CREATININE 2.70*   < > 3.44*   < > 2.30*  --  2.99*  --  1.75* 2.16* 3.02*  CALCIUM 8.1*   < > 7.9*   < > 7.9*   < > 8.1*   < > 7.9* 7.9* 8.3*  MG  --   --   --   --   --   --   --   --  1.7  --   --   PHOS 4.0  --  4.9*  --   --   --   --   --  2.4*  --   --    < > = values in this interval not displayed.    CBC: Recent Labs  Lab 03/18/19 0621 03/18/19 1001 03/19/19 0633 03/20/19 0539 03/20/19 1215  WBC 5.7 5.6 4.6 4.4 5.0  HGB  8.2* 8.7* 8.7* 7.8* 8.3*  HCT 25.3* 26.9* 27.1* 24.7* 26.4*  MCV 95.8 95.4 96.8 96.9 99.2  PLT 220 203 228 235 228     Coagulation Studies: Recent Labs    03/20/19 0819  LABPROT 13.8  INR 1.1    Imaging Reviewed:     ASSESSMENT AND PLAN  Interim History:    Detailed course from Dr. Cecil Cobbs note : "This is a complicated patient who was initially admitted on 12/7 with altered level of consciousness. This was initially attributed to metabolic factors including missing dialysis. She was found to have MRSA bacteremia on 12/8 started on IV vancomycin and Zosyn. On 12/10 she remains lethargic, did not talk much but would say her name.  The next few days, was noted that she was gradually improving and there is note of "dystonic" jaw movements. EEG was performed 12/9 with triphasic waves without epileptiform appearance.   She was givenCeftarolineand daptomycin and had positive blood cultures through 12/15 with a negative blood culture on 12/16.She has not been febrile recently, but has had low-grade temperature elevations in the mid 99's.  Neurology was consulted on 12/17 and given the length of time the patient had already been hospitalized and aggressive treatment that she had received, LP was not considered to be likely to provide any additional benefit. Palliative care also began talking with family on 12/17. TEE was considered, but due to her mental status she was not considered a candidate.  Over the next few days, her mental status continued to worsen the patient was not eating. On 12/21, due to having a wound on her graft, she had dialysis catheter placed in left IJ. She has been anemic with a hemoglobin of 6.8 on 12/22 after discontinuation of ceftaroline, on 12/23, it was noted there was improvement of her mental status and there was question about whether this could have been cephalosporin related encephalopathy. But thenshe worsened again.  She had a  PEG placed on 12/29. She was intubated for the procedure and was not extubated after the procedure. She had her graft explanted on 12/30,as there was concern for source control. She was extubated on 12/31. She apparently was speaking with the nurse after extubation. On 1/1 it was noted that she was more responsive than she had been the entire hospitalization,but then again on 1/3 as noted she was not answering questions or following commands.  Neurology reconsulted on 1/5, at that time she did not follow commands or answer questions.Reevaluated by neurology on 01/09 - "For the past year he has had to do all of the cooking, cleaning and handling of finances. He has noticed some memory issues as well." At that point, she was started on thiamine. "   64 year female with h/o of Afib on Eliquis, ESRD, COVID 19 infection in June 2020  with persistent encephalopathy after initially presenting with AMS and fever on 12/07 due to MRSA bacteremia with cultures negative since 12/16.  Has been evaluated by neurology multiple times, EEGx 2 negative, MRI [facility]elman w/o contrast x 2 negative ( w contrast not performed due to ESRD) . LP completed on 03/20/19 (CSF shows only 8 WBC, Protein elevated at 73, Glucose 83)  which is not suggestive of bacterial meningitis.    Impression:   Persistent encephalopathy:  Likely multifactorial occluding hypoactive delirium in the setting of prolonged hospitalization, prior COVID-19 infection, MRSA bacteremia, end-stage renal disease.  Other considerations:  Less likely: Autoimmune encephalitis given absence on MRI findings, negative EEG, however since patient has been having waxing/waning AMS with progressive decline will check for VGKC and NMDA antibodies as part of [facility] panel which also includes paraneoplastic antibodies and anti TPO ab Serotinin Syndrome: less likely, no hyperflexia, fever. However, will d/c Venlafaxine as this could contribute to  delirium Thiamine deficiency, unlikely: B1 levels returned normal. No improvement with high dose thiamine  Recommendations Continue supportive treatment  F/U remaining CSF labs including oligoclonal bands F/U [facility] send out to [facility] clinic for [facility] ( encephalopathy panel)- will likely retrun in 1-2 weeks and anti-TPO ab D/C Venlafaxine  Agree with repeating blood cultures, patient has remained afebrile however.    Kaelee Pfeffer [facility] Pager Number [phone] For questions after 7pm please  refer to AMION to reach the Neurologist on call

## 2019-03-21 NOTE — Progress Notes (Signed)
[place], [place] 03/21/19  Subjective:   Hospital day # 40  Doing fair.  Husband is in the room with her Patient opens eyes, did not follow any commands but husband reports she did squeeze hand for him Last hemodialysis was on Friday.  2000 cc removed Renal: 01/15 0701 - 01/16 0700 In: 920 [NG/GT:920] Out: 2450 [Stool:450] Lab Results  Component Value Date   CREATININE 3.02 (H) 03/20/2019   CREATININE 2.16 (H) 03/19/2019   CREATININE 1.75 (H) 03/18/2019     Objective:  Vital signs in last 24 hours:  Temp:  [98.3 F (36.8 C)-99.3 F (37.4 C)] 99.3 F (37.4 C) (01/16 1213) Pulse Rate:  [83-90] 85 (01/16 1213) Resp:  [12-20] 18 (01/16 1213) BP: (99-160)/(41-75) 141/49 (01/16 1213) SpO2:  [97 %-100 %] 99 % (01/16 1213)  Weight change:  Filed Weights   03/16/19 1400 03/18/19 0850 03/18/19 1206  Weight: 86.5 kg 88.4 kg 84.3 kg    Intake/Output:    Intake/Output Summary (Last 24 hours) at 03/21/2019 1620 Last data filed at 03/21/2019 0450 Gross per 24 hour  Intake 400 ml  Output 2450 ml  Net -2050 ml     Physical Exam: General:  Chronically ill-appearing, laying in the bed  HEENT  anicteric, moist oral mucous membranes  Pulm/lungs  shallow breathing effort, no crackles  CVS/Heart  irregular rhythm, no rub  Abdomen:   Soft, nontender, PEG tube in place  Extremities:  Bilateral upper extremity 2+ edema  Neurologic:  Eyes are open, not following commands  Skin:  Ecchymosis  Access:  PermCath       Basic Metabolic Panel:  Recent Labs  Lab 03/15/19 0509 03/15/19 0509 03/16/19 0832 03/16/19 0832 03/17/19 0549 03/17/19 0549 03/18/19 0621 03/18/19 0621 03/18/19 1001 03/19/19 0449 03/20/19 0819  NA 138   < > 136   < > 137  --  137  --  136 136 136  K 3.8   < > 4.6   < > 4.0  --  4.2  --  3.2* 4.1 4.3  CL 101   < > 101   < > 101  --  100  --  98 101 101  CO2 26   < > 25   < > 29  --  28  --  29 29 26   GLUCOSE 203*   < >  175*   < > 163*  --  170*  --  156* 154* 143*  BUN 34*   < > 50*   < > 33*  --  43*  --  24* 28* 43*  CREATININE 2.70*   < > 3.44*   < > 2.30*  --  2.99*  --  1.75* 2.16* 3.02*  CALCIUM 8.1*   < > 7.9*   < > 7.9*   < > 8.1*   < > 7.9* 7.9* 8.3*  MG  --   --   --   --   --   --   --   --  1.7  --   --   PHOS 4.0  --  4.9*  --   --   --   --   --  2.4*  --   --    < > = values in this interval not displayed.     CBC: Recent Labs  Lab 03/18/19 0621 03/18/19 1001 03/19/19 0633 03/20/19 0539 03/20/19 1215  WBC 5.7 5.6 4.6 4.4 5.0  HGB 8.2* 8.7*  8.7* 7.8* 8.3*  HCT 25.3* 26.9* 27.1* 24.7* 26.4*  MCV 95.8 95.4 96.8 96.9 99.2  PLT 220 203 228 235 228      Lab Results  Component Value Date   HEPBSAG NON REACTIVE 03/18/2019      Microbiology:  Recent Results (from the past 240 hour(s))  Wet prep, genital     Status: Abnormal   Collection Time: 03/16/19 10:01 AM  Result Value Ref Range Status   Yeast Wet Prep HPF POC PRESENT (A) NONE SEEN Final   Trich, Wet Prep NONE SEEN NONE SEEN Final   Clue Cells Wet Prep HPF POC NONE SEEN NONE SEEN Final   WBC, Wet Prep HPF POC MANY (A) NONE SEEN Final   Sperm NONE SEEN  Final    Comment: Performed at [facility], [address], [place], [place] [address]  Aerobic Culture (superficial specimen)     Status: None   Collection Time: 03/17/19  5:13 PM   Specimen: Vaginal Fluid  Result Value Ref Range Status   Specimen Description   Final    VAGINA Performed at [facility], [address] [place], [place] [address]    Special Requests   Final    NONE Performed at [facility], [address] [place], [place] [address]    Gram Stain   Final    MODERATE WBC PRESENT,BOTH PMN AND MONONUCLEAR FEW GRAM POSITIVE COCCI IN PAIRS FEW GRAM NEGATIVE RODS Performed at [facility], [place] [address], [place], [place] [address]    Culture   Final    ABUNDANT ESCHERICHIA COLI MODERATE KLEBSIELLA  PNEUMONIAE    Report Status 03/21/2019 FINAL  Final   Organism ID, Bacteria ESCHERICHIA COLI  Final   Organism ID, Bacteria KLEBSIELLA PNEUMONIAE  Final      Susceptibility   Escherichia coli - MIC*    AMPICILLIN 4 SENSITIVE Sensitive     CEFAZOLIN <=4 SENSITIVE Sensitive     CEFEPIME <=0.12 SENSITIVE Sensitive     CEFTAZIDIME <=1 SENSITIVE Sensitive     CEFTRIAXONE <=0.25 SENSITIVE Sensitive     CIPROFLOXACIN <=0.25 SENSITIVE Sensitive     GENTAMICIN <=1 SENSITIVE Sensitive     IMIPENEM <=0.25 SENSITIVE Sensitive     TRIMETH/SULFA <=20 SENSITIVE Sensitive     AMPICILLIN/SULBACTAM <=2 SENSITIVE Sensitive     PIP/TAZO <=4 SENSITIVE Sensitive     * ABUNDANT ESCHERICHIA COLI   Klebsiella pneumoniae - MIC*    AMPICILLIN >=32 RESISTANT Resistant     CEFAZOLIN <=4 SENSITIVE Sensitive     CEFEPIME <=0.12 SENSITIVE Sensitive     CEFTAZIDIME <=1 SENSITIVE Sensitive     CEFTRIAXONE <=0.25 SENSITIVE Sensitive     CIPROFLOXACIN <=0.25 SENSITIVE Sensitive     GENTAMICIN <=1 SENSITIVE Sensitive     IMIPENEM <=0.25 SENSITIVE Sensitive     TRIMETH/SULFA <=20 SENSITIVE Sensitive     AMPICILLIN/SULBACTAM 4 SENSITIVE Sensitive     PIP/TAZO <=4 SENSITIVE Sensitive     * MODERATE KLEBSIELLA PNEUMONIAE  Anaerobic culture     Status: None (Preliminary result)   Collection Time: 03/20/19 12:40 PM   Specimen: PATH Cytology CSF; Cerebrospinal Fluid  Result Value Ref Range Status   Specimen Description   Final    CSF Performed at [name], [address] [place], [place] [address]    Special Requests   Final    NONE Performed at [facility], [address] [place],  [address]    Gram  Stain   Final    WBC PRESENT, PREDOMINANTLY MONONUCLEAR NO ORGANISMS SEEN CYTOSPIN SMEAR Performed at [facility], [place] [address] [place], [place] [address]    Culture PENDING  Incomplete   Report Status PENDING  Incomplete  CSF culture     Status: None (Preliminary  result)   Collection Time: 03/20/19 12:40 PM   Specimen: PATH Cytology CSF; Cerebrospinal Fluid  Result Value Ref Range Status   Specimen Description   Final    CSF Performed at [facility], [address] [place], [place] [address]    Special Requests   Final    NONE Performed at [facility], [address] [place], [place] [address]    Gram Stain   Final    WBC PRESENT, PREDOMINANTLY MONONUCLEAR NO ORGANISMS SEEN CYTOSPIN SMEAR    Culture   Final    NO GROWTH < 12 HOURS Performed at [facility], [place] [address] [place], [place] [address]    Report Status PENDING  Incomplete    Coagulation Studies: Recent Labs    03/20/19 0819  LABPROT 13.8  INR 1.1    Urinalysis: No results for input(s): COLORURINE, LABSPEC, PHURINE, GLUCOSEU, HGBUR, BILIRUBINUR, KETONESUR, PROTEINUR, UROBILINOGEN, NITRITE, LEUKOCYTESUR in the last 72 hours.  Invalid input(s): APPERANCEUR    Imaging: DG FL GUIDED LUMBAR PUNCTURE  Result Date: 03/20/2019 CLINICAL DATA:  Altered mental status. EXAM: DIAGNOSTIC LUMBAR PUNCTURE UNDER FLUOROSCOPIC GUIDANCE FLUOROSCOPY TIME:  Fluoroscopy Time:  0 minutes 42 seconds Radiation Exposure Index (if provided by the fluoroscopic device): 20.0 mGy Number of Acquired Spot Images: 1. PROCEDURE: After discussing the risks and benefits of this procedure with the patient's husband informed consent was obtained. Given patient's history of recent anticoagulation the increased risk of bleeding was discussed with the patient's husband. The back was sterilely prepped and draped. Following local anesthesia with 1% lidocaine a 22 gauge needle was advanced into the lumbar thecal sac at the L4-L5 level without complication. Clear CSF was obtained. Four separate tubes containing 2 cc each were obtained. Samples sent to the laboratory for further evaluation. Following the procedure hemostasis achieved. No complications. IMPRESSION: Successful  fluoroscopically directed lumbar puncture. Electronically Signed   By: Marcello Moores  Register   On: 03/20/2019 13:35     Medications:   . sodium chloride 250 mL (03/18/19 1635)  . ampicillin-sulbactam (UNASYN) IV    . feeding supplement (OSMOLITE 1.5 CAL) 1,000 mL (03/20/19 0546)  . vancomycin Stopped (03/20/19 1849)   . alteplase  2 mg Intracatheter Once  . alteplase  2 mg Intracatheter Once  . apixaban  5 mg Oral BID  . aspirin  81 mg Per Tube Daily  . B-complex with vitamin C  1 tablet Per Tube Daily  . Chlorhexidine Gluconate Cloth  6 each Topical Q0600  . clotrimazole  1 Applicatorful Vaginal QHS  . epoetin (EPOGEN/PROCRIT) injection  10,000 Units Intravenous Q M,W,F-HD  . feeding supplement (PRO-STAT SUGAR FREE 64)  30 mL Per Tube Daily  . fluticasone  2 spray Each Nare Daily  . free water  20 mL Per Tube Q4H  . insulin aspart  0-15 Units Subcutaneous Q4H  . levothyroxine  100 mcg Per Tube QAC breakfast  . mouth rinse  15 mL Mouth Rinse BID  . pantoprazole (PROTONIX) IV  40 mg Intravenous Q12H  . psyllium  1 packet Per Tube TID  . zinc sulfate  220 mg Per Tube Daily   sodium chloride, acetaminophen,  ipratropium-albuterol, ondansetron (ZOFRAN) IV, traMADol  Assessment/ Plan:  64 y.o. female with  end-stage renal disease on hemodialysis, atrial fibrillation, chronic diastolic congestive heart failure, coronary artery disease status post CABG, diabetes mellitus type II, hypertension, hyperlipidemia, hypothyroidism, Covid positive in July 2020, recent nursing home stay in [place], Prolonged hospitalization for MRSA bacteremia/sepsis  admitted on 02/09/2019 for ESRD (end stage renal disease) ([place]) [N18.6] Altered mental status, unspecified altered mental status type [R41.82] Chest pain, unspecified type [R07.9] Sepsis, due to unspecified organism, unspecified whether acute organ dysfunction present ([place]) [A41.9]  [facility] M[facility]   #End-stage renal disease Continue  hemodialysis via PermCath on MWF schedule  #Acute encephalopathy Slow improvement  #MRSA sepsis Right arm AV graft removed this admission on March 04, 2019  #Chronic hypotension We will consider restarting midodrine Also getting IV albumin intermittently during dialysis  #Anemia of chronic kidney disease Epogen scheduled with dialysis Added iron tab with Peg tube   LOS: [place] 1/16/20214:20 PM  [place], Reader  Note: This note was prepared with Dragon dictation. Any transcription errors are unintentional

## 2019-03-22 LAB — RENAL FUNCTION PANEL
Albumin: 2 g/dL — ABNORMAL LOW (ref 3.5–5.0)
Anion gap: 10 (ref 5–15)
BUN: 46 mg/dL — ABNORMAL HIGH (ref 8–23)
CO2: 26 mmol/L (ref 22–32)
Calcium: 8.6 mg/dL — ABNORMAL LOW (ref 8.9–10.3)
Chloride: 101 mmol/L (ref 98–111)
Creatinine, Ser: 3.22 mg/dL — ABNORMAL HIGH (ref 0.44–1.00)
GFR calc Af Amer: 17 mL/min — ABNORMAL LOW (ref >60)
GFR calc non Af Amer: 14 mL/min — ABNORMAL LOW (ref >60)
Glucose, Bld: 194 mg/dL — ABNORMAL HIGH (ref 70–99)
Phosphorus: 3.8 mg/dL (ref 2.5–4.6)
Potassium: 3.9 mmol/L (ref 3.5–5.1)
Sodium: 137 mmol/L (ref 135–145)

## 2019-03-22 LAB — HEMOGLOBIN AND HEMATOCRIT, BLOOD
HCT: 26 % — ABNORMAL LOW (ref 36.0–46.0)
Hemoglobin: 8.4 g/dL — ABNORMAL LOW (ref 12.0–15.0)

## 2019-03-22 LAB — GLUCOSE, CAPILLARY
Glucose-Capillary: 141 mg/dL — ABNORMAL HIGH (ref 70–99)
Glucose-Capillary: 172 mg/dL — ABNORMAL HIGH (ref 70–99)
Glucose-Capillary: 172 mg/dL — ABNORMAL HIGH (ref 70–99)
Glucose-Capillary: 172 mg/dL — ABNORMAL HIGH (ref 70–99)
Glucose-Capillary: 180 mg/dL — ABNORMAL HIGH (ref 70–99)
Glucose-Capillary: 194 mg/dL — ABNORMAL HIGH (ref 70–99)

## 2019-03-22 NOTE — Progress Notes (Signed)
ID  Status quo  Patient Vitals for the past 24 hrs:  BP Temp Temp src Pulse Resp SpO2  03/22/19 1244 (!) 148/58 98.7 F (37.1 C) Axillary 87 19 100 %  03/22/19 0702 (!) 156/68 -- -- 80 20 98 %  03/21/19 2209 (!) 142/54 98.5 F (36.9 C) Oral 86 20 100 %  03/21/19 1717 (!) 149/68 98.6 F (37 C) Axillary 86 18 100 %      Obtunded Chest b/l air entry [id number] PEG in place Left IJ dialysis cath  CBC Latest Ref Rng & Units 03/22/2019 03/20/2019 03/20/2019  WBC 4.0 - 10.5 K/uL - 5.0 4.4  Hemoglobin 12.0 - 15.0 g/dL 8.4(L) 8.3(L) 7.8(L)  Hematocrit 36.0 - 46.0 % 26.0(L) 26.4(L) 24.7(L)  Platelets 150 - 400 K/uL - 228 235   CMP Latest Ref Rng & Units 03/22/2019 03/20/2019 03/19/2019  Glucose 70 - 99 mg/dL 194(H) 143(H) 154(H)  BUN 8 - 23 mg/dL 46(H) 43(H) 28(H)  Creatinine 0.44 - 1.00 mg/dL 3.22(H) 3.02(H) 2.16(H)  Sodium 135 - 145 mmol/L 137 136 136  Potassium 3.5 - 5.1 mmol/L 3.9 4.3 4.1  Chloride 98 - 111 mmol/L 101 101 101  CO2 22 - 32 mmol/L 26 26 29   Calcium 8.9 - 10.3 mg/dL 8.6(L) 8.3(L) 7.9(L)  Total Protein 6.5 - 8.1 g/dL - - -  Total Bilirubin 0.3 - 1.2 mg/dL - - -  Alkaline Phos 38 - 126 U/L - - -  AST 15 - 41 U/L - - -  ALT 0 - 44 U/L - - -   Blood culture- 12/7, 12/9, 12/13 MRSA 12/16 -BC -NG  1/12 vaginal culture kleb and e.coli  03/20/19 CSF culture NG so far   Impression/Recommendation  MRSA bacteremia with infected AV fistual with stent which have been removed Pt is getting 6 weeks of Anti MRSA treatment form the first neg blood culture Last date 04/01/19   Encephalopathy- persist H/o COVID, ESRD, infection all contributing CSF examination unrevealingexcept for 8 wbc and 73 mg protein. Neurologist has ordered encephalopathy panel  Greenish vaginal discharge- culture sent by ob had e.coli and kleb which could very weill be colonization but because of profuse discharge and concern for endometritis or vaginitis- started on unasyn Much improved- if IV line  a problem then can do thru peg. Last day 03/23/19

## 2019-03-22 NOTE — Progress Notes (Addendum)
PROGRESS NOTE    Jamie Arias  [id number] DOB: Dec 27, 1955 DOA: 02/09/2019 PCP: [name], MD   Brief Narrative:  [name] is a 64 y.o. white female with end-stage renal disease on hemodialysis, atrial fibrillation, chronic diastolic congestive heart failure, coronary artery disease status post CABG, diabetes mellitus type II, hypertension, hyperlipidemia, hypothyroidism, Covid positive in July 2020, recent nursing home stay in [place]. Prolonged hospitalization for MRSA bacteremia/sepsis and now with acute encephalopathy.  Subjective: Responded to verbal command but did not necessarily follow as she open eyes and looked at the source of sound.  No fever overnight.  Assessment & Plan:   Principal Problem:   Sepsis due to methicillin resistant Staphylococcus aureus (MRSA) ([place]) Active Problems:   Type 2 diabetes mellitus with ESRD (end-stage renal disease) (HCC)   Atrial fibrillation, chronic (HCC)   Hypotension   Chronic diastolic CHF (congestive heart failure) (HCC)   ESRD (end stage renal disease) (HCC)   Altered level of consciousness   Decubitus ulcer of heel, bilateral, stage 2 (HCC)   MRSA bacteremia   Arm DVT (deep venous thromboembolism), acute, left (HCC)   Acute encephalopathy   Acute metabolic encephalopathy   Acute blood loss anemia   AF (paroxysmal atrial fibrillation) ([place])   Diarrhea   Oropharyngeal dysphagia   Abscess  Sepsis secondary to MRSA bacteremia.  No obvious source.  Right arm graft was removed on 03/04/2019. -Continue with vancomycin for 6-week, will finish her course on 04/01/2019.       -Repeat blood culture from 03/21/2019 pending  Acute metabolic encephalopathy:  mental status remains unchanged -  MRI of brain and EEG negative. Patient remained afebrile with no leukocytosis.  B12 within normal range, folate and B1 results are pending.  -  She was started on thiamine challenge by neurology but not much improvement. -  Because of persistent encephalopathy - Neuro recommends LP   - she needs to be off Eliquis for 48 hrs and off of heparin - IR  performed LP on 03/20/19 AM: CSF shows only 8 WBC, Protein elevated at 73, Glucose 83. Negative for Toxo  -CSF panel is a send out; will follow result -Appreciate IR assistance - Repeat TSH was elevated. - Increased dose of Synthroid to 100 mcg -Overall prognosis seems poor.  -Palliative care consulted.  They have been following the patient and in touch with husband.  In the meantime continue medical treatment.  Bleeding from catheter site with multiple ecchymosis.  No new episode over the past 24 to 48 hours -  warfarin was discontinued due to concern for necrosis of skin.  Hemoglobin dropped to 6.8; improved to 8.7 and remains mostly above 8 -Resumed eliquis lower dose; so far no new episode of bleeding   Vaginal discharge: - New episode  There was some nursing concern of vaginal discharge  -there is some yeast but Gyn feels this may be colonization - may not need treatment. She is not symptomatic -Since culture grew E. coli and Klebsiella; contamination questionable  - Unasyn also started on 03/21/2019 per ID  Anemia.  Status post transfusion 1 unit of PRBC on 03/17/2019 -  hemoglobin stable above 8.  Iron studies consistent with anemia of chronic illness.  MCV elevated.  -B12 within normal range -Continue EPO with dialysis.  Hypotension.  Resolved now  ESRD (M WF).   Currently using tunneled catheter as graft was removed due to MRSA bacteremia. -Continue with scheduled dialysis per nephrology  Left  arm DVT.  Currently on Eliquis - recent diagnosis of left arm DVT on 03/03/2019. -Discontinue Coumadin due to concern for Coumadin toxicity/side effect due to her skin necrotic lesions.  Hypothyroidism.  Repeat TSH elevated at 17.82. -Increased the dose of Synthroid to 100. - repeat TSH in 2 to 3 weeks.  Paroxysmal A. Fib.. Currently in sinus. Amiodarone was  discontinued. -Continue Eliquis  Type 2 diabetes. -Continue SSI.  Chronic diastolic heart failure.  Volume being managed by dialysis.  Bilateral decubitus ulcers.  Present on admission. -Continue with wound care.  Nutrition.  PEG placed 03/03/2019.  Continue tube feeding.  Acute hypoxic respiratory failure.  Resolved.  Patient was intubated initially, extubated on 03/05/2019. -On room air now  Family communication: Spoke with patient's husband [name].  Answered to all questions.  Updated.  Expressed understanding.  Objective: Vitals:   03/21/19 1717 03/21/19 2209 03/22/19 0702 03/22/19 1244  BP: (!) 149/68 (!) 142/54 (!) 156/68 (!) 148/58  Pulse: 86 86 80 87  Resp: 18 20 20 19   Temp: 98.6 F (37 C) 98.5 F (36.9 C)  98.7 F (37.1 C)  TempSrc: Axillary Oral  Axillary  SpO2: 100% 100% 98% 100%  Weight:      Height:        Intake/Output Summary (Last 24 hours) at 03/22/2019 1516 Last data filed at 03/22/2019 0600 Gross per 24 hour  Intake 870.41 ml  Output 0 ml  Net 870.41 ml   Filed Weights   03/16/19 1400 03/18/19 0850 03/18/19 1206  Weight: 86.5 kg 88.4 kg 84.3 kg    Examination:  General exam: Chronically ill-appearing lady, minimal response to painful stimuli, in no acute distress. Respiratory system: Clear to auscultation. Respiratory effort normal. Cardiovascular system: S1 & S2 heard, RRR. No JVD, murmurs, rubs, gallops or clicks. Gastrointestinal system: Soft, nontender, nondistended, bowel sounds positive. Central nervous system: Unable to assess as patient is minimally responsive and not following any commands. Extremities: Bilateral upper extremity edema, no cyanosis, pulses intact and symmetrical. Skin: Decubitus ulcers on both heels.  Multiple ecchymosis on all extremities and upper chest, appears healing now. Psychiatry: Judgement and insight appear impaired.  DVT prophylaxis: Eliquis Code Status: DNR Family Communication: Husband was updated at  bedside. Disposition Plan: Waiting for bed offer at [facility]  Consultants:   Nephrology  Infectious disease  Vascular surgery  Neurology  Critical care specialist   Procedures:  Antimicrobials:  Vancomycin.  Data Reviewed: I have personally reviewed following labs and imaging studies  CBC: Recent Labs  Lab 03/18/19 0621 03/18/19 0621 03/18/19 1001 03/19/19 0633 03/20/19 0539 03/20/19 1215 03/22/19 0817  WBC 5.7  --  5.6 4.6 4.4 5.0  --   HGB 8.2*   < > 8.7* 8.7* 7.8* 8.3* 8.4*  HCT 25.3*   < > 26.9* 27.1* 24.7* 26.4* 26.0*  MCV 95.8  --  95.4 96.8 96.9 99.2  --   PLT 220  --  203 228 235 228  --    < > = values in this interval not displayed.   Basic Metabolic Panel: Recent Labs  Lab 03/16/19 0832 03/17/19 0549 03/18/19 0621 03/18/19 1001 03/19/19 0449 03/20/19 0819 03/22/19 0817  NA 136   < > 137 136 136 136 137  K 4.6   < > 4.2 3.2* 4.1 4.3 3.9  CL 101   < > 100 98 101 101 101  CO2 25   < > 28 29 29 26 26   GLUCOSE 175*   < >  170* 156* 154* 143* 194*  BUN 50*   < > 43* 24* 28* 43* 46*  CREATININE 3.44*   < > 2.99* 1.75* 2.16* 3.02* 3.22*  CALCIUM 7.9*   < > 8.1* 7.9* 7.9* 8.3* 8.6*  MG  --   --   --  1.7  --   --   --   PHOS 4.9*  --   --  2.4*  --   --  3.8   < > = values in this interval not displayed.   GFR: Estimated Creatinine Clearance: 18.3 mL/min (A) (by C-G formula based on SCr of 3.22 mg/dL (H)). Liver Function Tests: Recent Labs  Lab 03/16/19 0832 03/18/19 1001 03/22/19 0817  ALBUMIN 1.7* 1.8* 2.0*   No results for input(s): LIPASE, AMYLASE in the last 168 hours. No results for input(s): AMMONIA in the last 168 hours. Coagulation Profile: Recent Labs  Lab 03/20/19 0819  INR 1.1   Cardiac Enzymes: Recent Labs  Lab 03/21/19 1909  CKTOTAL 26*   BNP (last 3 results) No results for input(s): PROBNP in the last 8760 hours. HbA1C: No results for input(s): HGBA1C in the last 72 hours. CBG: Recent Labs  Lab  03/21/19 2055 03/21/19 2355 03/22/19 0426 03/22/19 0748 03/22/19 1206  GLUCAP 193* 179* 194* 172* 141*   Lipid Profile: No results for input(s): CHOL, HDL, LDLCALC, TRIG, CHOLHDL, LDLDIRECT in the last 72 hours. Thyroid Function Tests: No results for input(s): TSH, T4TOTAL, FREET4, T3FREE, THYROIDAB in the last 72 hours. Anemia Panel: No results for input(s): VITAMINB12, FOLATE, FERRITIN, TIBC, IRON, RETICCTPCT in the last 72 hours. Sepsis Labs: No results for input(s): PROCALCITON, LATICACIDVEN in the last 168 hours.  Recent Results (from the past 240 hour(s))  Wet prep, genital     Status: Abnormal   Collection Time: 03/16/19 10:01 AM  Result Value Ref Range Status   Yeast Wet Prep HPF POC PRESENT (A) NONE SEEN Final   Trich, Wet Prep NONE SEEN NONE SEEN Final   Clue Cells Wet Prep HPF POC NONE SEEN NONE SEEN Final   WBC, Wet Prep HPF POC MANY (A) NONE SEEN Final   Sperm NONE SEEN  Final    Comment: Performed at [facility], [address], [place], [place] [address]  Aerobic Culture (superficial specimen)     Status: None   Collection Time: 03/17/19  5:13 PM   Specimen: Vaginal Fluid  Result Value Ref Range Status   Specimen Description   Final    VAGINA Performed at [facility], [address] [place], [place] [address]    Special Requests   Final    NONE Performed at [facility], [address] [place], [place] [address]    Gram Stain   Final    MODERATE WBC PRESENT,BOTH PMN AND MONONUCLEAR FEW GRAM POSITIVE COCCI IN PAIRS FEW GRAM NEGATIVE RODS Performed at [facility], [place] [address] [place], [place] [address]    Culture   Final    ABUNDANT ESCHERICHIA COLI MODERATE KLEBSIELLA PNEUMONIAE    Report Status 03/21/2019 FINAL  Final   Organism ID, Bacteria ESCHERICHIA COLI  Final   Organism ID, Bacteria KLEBSIELLA PNEUMONIAE  Final      Susceptibility   Escherichia coli - MIC*    AMPICILLIN 4 SENSITIVE Sensitive      CEFAZOLIN <=4 SENSITIVE Sensitive     CEFEPIME <=0.12 SENSITIVE Sensitive     CEFTAZIDIME <=1 SENSITIVE Sensitive     CEFTRIAXONE <=  0.25 SENSITIVE Sensitive     CIPROFLOXACIN <=0.25 SENSITIVE Sensitive     GENTAMICIN <=1 SENSITIVE Sensitive     IMIPENEM <=0.25 SENSITIVE Sensitive     TRIMETH/SULFA <=20 SENSITIVE Sensitive     AMPICILLIN/SULBACTAM <=2 SENSITIVE Sensitive     PIP/TAZO <=4 SENSITIVE Sensitive     * ABUNDANT ESCHERICHIA COLI   Klebsiella pneumoniae - MIC*    AMPICILLIN >=32 RESISTANT Resistant     CEFAZOLIN <=4 SENSITIVE Sensitive     CEFEPIME <=0.12 SENSITIVE Sensitive     CEFTAZIDIME <=1 SENSITIVE Sensitive     CEFTRIAXONE <=0.25 SENSITIVE Sensitive     CIPROFLOXACIN <=0.25 SENSITIVE Sensitive     GENTAMICIN <=1 SENSITIVE Sensitive     IMIPENEM <=0.25 SENSITIVE Sensitive     TRIMETH/SULFA <=20 SENSITIVE Sensitive     AMPICILLIN/SULBACTAM 4 SENSITIVE Sensitive     PIP/TAZO <=4 SENSITIVE Sensitive     * MODERATE KLEBSIELLA PNEUMONIAE  Anaerobic culture     Status: None (Preliminary result)   Collection Time: 03/20/19 12:40 PM   Specimen: PATH Cytology CSF; Cerebrospinal Fluid  Result Value Ref Range Status   Specimen Description   Final    CSF Performed at [facility], [address] [place], [place] [address]    Special Requests   Final    NONE Performed at [facility], [address] [place], [place] [address]    Gram Stain   Final    WBC PRESENT, PREDOMINANTLY MONONUCLEAR NO ORGANISMS SEEN CYTOSPIN SMEAR Performed at [facility], [place] [address], [place], [place] [address]    Culture   Final    NO ANAEROBES ISOLATED; CULTURE IN PROGRESS FOR 5 DAYS   Report Status PENDING  Incomplete  CSF culture     Status: None (Preliminary result)   Collection Time: 03/20/19 12:40 PM   Specimen: PATH Cytology CSF; Cerebrospinal Fluid  Result Value Ref Range Status   Specimen Description   Final    CSF Performed at [facility], [address] [place], [place] [address]    Special Requests   Final    NONE Performed at [facility], [address] [place], [place] [address]    Gram Stain   Final    WBC PRESENT, PREDOMINANTLY MONONUCLEAR NO ORGANISMS SEEN CYTOSPIN SMEAR    Culture   Final    NO GROWTH 2 DAYS Performed at [facility], [place] [address], [place], [place] [address]    Report Status PENDING  Incomplete     Radiology Studies: No results found.  Scheduled Meds: . alteplase  2 mg Intracatheter Once  . alteplase  2 mg Intracatheter Once  . apixaban  5 mg Oral BID  . aspirin  81 mg Per Tube Daily  . B-complex with vitamin C  1 tablet Per Tube Daily  . Chlorhexidine Gluconate Cloth  6 each Topical Q0600  . clotrimazole  1 Applicatorful Vaginal QHS  . epoetin (EPOGEN/PROCRIT) injection  10,000 Units Intravenous Q M,W,F-HD  . feeding supplement (PRO-STAT SUGAR FREE 64)  30 mL Per Tube Daily  . ferrous gluconate  324 mg Oral Daily  . fluticasone  2 spray Each Nare Daily  . free water  20 mL Per Tube Q4H  . insulin aspart  0-15 Units Subcutaneous Q4H  . levothyroxine  100 mcg Per Tube QAC breakfast  . mouth rinse  15 mL Mouth Rinse BID  . pantoprazole (PROTONIX) IV  40 mg Intravenous Q12H  .  psyllium  1 packet Per Tube TID  . zinc sulfate  220 mg Per Tube Daily   Continuous Infusions: . sodium chloride 250 mL (03/18/19 1635)  . ampicillin-sulbactam (UNASYN) IV Stopped (03/22/19 0536)  . feeding supplement (OSMOLITE 1.5 CAL) 1,000 mL (03/22/19 1501)  . vancomycin Stopped (03/20/19 1849)     LOS: 41 days   Time spent: 35 min.   Thornell Mule, MD [facility] Pager [phone]  If 7PM-7AM, please contact night-coverage [email or web address] Password [facility] 03/22/2019, 3:16 PM   This record has been created using Dragon voice recognition software. Errors have been sought and corrected,but may not always be located. Such creation errors do not reflect on the standard  of care.

## 2019-03-22 NOTE — Progress Notes (Signed)
[place], [place] 03/22/19  Subjective:   Hospital day # 41  Doing fair.  Husband is in the room with her Patient opens eyes, did not follow any commands but husband reports she did squeeze hand for him yesterday Last hemodialysis was on Friday.  2000 cc removed Femoral central line has been removed Tube feeds continued at 50 cc/h Renal: 01/16 0701 - 01/17 0700 In: 870.4 [NG/GT:740; IV Piggyback:130.4] Out: 0  Lab Results  Component Value Date   CREATININE 3.22 (H) 03/22/2019   CREATININE 3.02 (H) 03/20/2019   CREATININE 2.16 (H) 03/19/2019     Objective:  Vital signs in last 24 hours:  Temp:  [98.5 F (36.9 C)-98.7 F (37.1 C)] 98.7 F (37.1 C) (01/17 1244) Pulse Rate:  [80-87] 87 (01/17 1244) Resp:  [18-20] 19 (01/17 1244) BP: (142-156)/(54-68) 148/58 (01/17 1244) SpO2:  [98 %-100 %] 100 % (01/17 1244)  Weight change:  Filed Weights   03/16/19 1400 03/18/19 0850 03/18/19 1206  Weight: 86.5 kg 88.4 kg 84.3 kg    Intake/Output:    Intake/Output Summary (Last 24 hours) at 03/22/2019 1258 Last data filed at 03/22/2019 0600 Gross per 24 hour  Intake 870.41 ml  Output 0 ml  Net 870.41 ml     Physical Exam: General:  Chronically ill-appearing, laying in the bed  HEENT  anicteric, moist oral mucous membranes  Pulm/lungs  shallow breathing effort, no crackles  CVS/Heart  irregular rhythm, no rub  Abdomen:   Soft, nontender, PEG tube in place  Extremities:  Bilateral upper extremity 2+ edema  Neurologic:  opens eyes, not following commands  Skin:  Ecchymosis  Access:  PermCath       Basic Metabolic Panel:  Recent Labs  Lab 03/16/19 0832 03/17/19 0549 03/18/19 0621 03/18/19 0621 03/18/19 1001 03/18/19 1001 03/19/19 0449 03/20/19 0819 03/22/19 0817  NA 136   < > 137  --  136  --  136 136 137  K 4.6   < > 4.2  --  3.2*  --  4.1 4.3 3.9  CL 101   < > 100  --  98  --  101 101 101  CO2 25   < > 28  --  29  --  29 26 26    GLUCOSE 175*   < > 170*  --  156*  --  154* 143* 194*  BUN 50*   < > 43*  --  24*  --  28* 43* 46*  CREATININE 3.44*   < > 2.99*  --  1.75*  --  2.16* 3.02* 3.22*  CALCIUM 7.9*   < > 8.1*   < > 7.9*   < > 7.9* 8.3* 8.6*  MG  --   --   --   --  1.7  --   --   --   --   PHOS 4.9*  --   --   --  2.4*  --   --   --  3.8   < > = values in this interval not displayed.     CBC: Recent Labs  Lab 03/18/19 0621 03/18/19 0621 03/18/19 1001 03/19/19 0633 03/20/19 0539 03/20/19 1215 03/22/19 0817  WBC 5.7  --  5.6 4.6 4.4 5.0  --   HGB 8.2*   < > 8.7* 8.7* 7.8* 8.3* 8.4*  HCT 25.3*   < > 26.9* 27.1* 24.7* 26.4* 26.0*  MCV 95.8  --  95.4 96.8 96.9 99.2  --  PLT 220  --  203 228 235 228  --    < > = values in this interval not displayed.      Lab Results  Component Value Date   HEPBSAG NON REACTIVE 03/18/2019      Microbiology:  Recent Results (from the past 240 hour(s))  Wet prep, genital     Status: Abnormal   Collection Time: 03/16/19 10:01 AM  Result Value Ref Range Status   Yeast Wet Prep HPF POC PRESENT (A) NONE SEEN Final   Trich, Wet Prep NONE SEEN NONE SEEN Final   Clue Cells Wet Prep HPF POC NONE SEEN NONE SEEN Final   WBC, Wet Prep HPF POC MANY (A) NONE SEEN Final   Sperm NONE SEEN  Final    Comment: Performed at [facility], [address] [place], [place] [address]  Aerobic Culture (superficial specimen)     Status: None   Collection Time: 03/17/19  5:13 PM   Specimen: Vaginal Fluid  Result Value Ref Range Status   Specimen Description   Final    VAGINA Performed at [facility], [address] [place], [place] [address]    Special Requests   Final    NONE Performed at [facility], [address] [place], [place] [address]    Gram Stain   Final    MODERATE WBC PRESENT,BOTH PMN AND MONONUCLEAR FEW GRAM POSITIVE COCCI IN PAIRS FEW GRAM NEGATIVE RODS Performed at [facility], [place] [address] [place], [place]  [address]    Culture   Final    ABUNDANT ESCHERICHIA COLI MODERATE KLEBSIELLA PNEUMONIAE    Report Status 03/21/2019 FINAL  Final   Organism ID, Bacteria ESCHERICHIA COLI  Final   Organism ID, Bacteria KLEBSIELLA PNEUMONIAE  Final      Susceptibility   Escherichia coli - MIC*    AMPICILLIN 4 SENSITIVE Sensitive     CEFAZOLIN <=4 SENSITIVE Sensitive     CEFEPIME <=0.12 SENSITIVE Sensitive     CEFTAZIDIME <=1 SENSITIVE Sensitive     CEFTRIAXONE <=0.25 SENSITIVE Sensitive     CIPROFLOXACIN <=0.25 SENSITIVE Sensitive     GENTAMICIN <=1 SENSITIVE Sensitive     IMIPENEM <=0.25 SENSITIVE Sensitive     TRIMETH/SULFA <=20 SENSITIVE Sensitive     AMPICILLIN/SULBACTAM <=2 SENSITIVE Sensitive     PIP/TAZO <=4 SENSITIVE Sensitive     * ABUNDANT ESCHERICHIA COLI   Klebsiella pneumoniae - MIC*    AMPICILLIN >=32 RESISTANT Resistant     CEFAZOLIN <=4 SENSITIVE Sensitive     CEFEPIME <=0.12 SENSITIVE Sensitive     CEFTAZIDIME <=1 SENSITIVE Sensitive     CEFTRIAXONE <=0.25 SENSITIVE Sensitive     CIPROFLOXACIN <=0.25 SENSITIVE Sensitive     GENTAMICIN <=1 SENSITIVE Sensitive     IMIPENEM <=0.25 SENSITIVE Sensitive     TRIMETH/SULFA <=20 SENSITIVE Sensitive     AMPICILLIN/SULBACTAM 4 SENSITIVE Sensitive     PIP/TAZO <=4 SENSITIVE Sensitive     * MODERATE KLEBSIELLA PNEUMONIAE  Anaerobic culture     Status: None (Preliminary result)   Collection Time: 03/20/19 12:40 PM   Specimen: PATH Cytology CSF; Cerebrospinal Fluid  Result Value Ref Range Status   Specimen Description   Final    CSF Performed at [facility], [address] [place], [place] [address]    Special Requests   Final    NONE Performed at [facility], [address], [place],  [address]    Gram Stain  Final    WBC PRESENT, PREDOMINANTLY MONONUCLEAR NO ORGANISMS SEEN CYTOSPIN SMEAR Performed at [facility], [place] [address] [place], [place] [address]    Culture   Final    NO ANAEROBES  ISOLATED; CULTURE IN PROGRESS FOR 5 DAYS   Report Status PENDING  Incomplete  CSF culture     Status: None (Preliminary result)   Collection Time: 03/20/19 12:40 PM   Specimen: PATH Cytology CSF; Cerebrospinal Fluid  Result Value Ref Range Status   Specimen Description   Final    CSF Performed at [facility], [address] [place], [place] [address]    Special Requests   Final    NONE Performed at [facility], [address] [place], [place] [address]    Gram Stain   Final    WBC PRESENT, PREDOMINANTLY MONONUCLEAR NO ORGANISMS SEEN CYTOSPIN SMEAR    Culture   Final    NO GROWTH 2 DAYS Performed at [facility], [place] [address] [place], [place] [address]    Report Status PENDING  Incomplete    Coagulation Studies: Recent Labs    03/20/19 0819  LABPROT 13.8  INR 1.1    Urinalysis: No results for input(s): COLORURINE, LABSPEC, PHURINE, GLUCOSEU, HGBUR, BILIRUBINUR, KETONESUR, PROTEINUR, UROBILINOGEN, NITRITE, LEUKOCYTESUR in the last 72 hours.  Invalid input(s): APPERANCEUR    Imaging: DG FL GUIDED LUMBAR PUNCTURE  Result Date: 03/20/2019 CLINICAL DATA:  Altered mental status. EXAM: DIAGNOSTIC LUMBAR PUNCTURE UNDER FLUOROSCOPIC GUIDANCE FLUOROSCOPY TIME:  Fluoroscopy Time:  0 minutes 42 seconds Radiation Exposure Index (if provided by the fluoroscopic device): 20.0 mGy Number of Acquired Spot Images: 1. PROCEDURE: After discussing the risks and benefits of this procedure with the patient's husband informed consent was obtained. Given patient's history of recent anticoagulation the increased risk of bleeding was discussed with the patient's husband. The back was sterilely prepped and draped. Following local anesthesia with 1% lidocaine a 22 gauge needle was advanced into the lumbar thecal sac at the L4-L5 level without complication. Clear CSF was obtained. Four separate tubes containing 2 cc each were obtained. Samples sent to the laboratory for  further evaluation. Following the procedure hemostasis achieved. No complications. IMPRESSION: Successful fluoroscopically directed lumbar puncture. Electronically Signed   By: Marcello Moores  Register   On: 03/20/2019 13:35     Medications:   . sodium chloride 250 mL (03/18/19 1635)  . ampicillin-sulbactam (UNASYN) IV Stopped (03/22/19 0536)  . feeding supplement (OSMOLITE 1.5 CAL) 1,000 mL (03/21/19 1743)  . vancomycin Stopped (03/20/19 1849)   . alteplase  2 mg Intracatheter Once  . alteplase  2 mg Intracatheter Once  . apixaban  5 mg Oral BID  . aspirin  81 mg Per Tube Daily  . B-complex with vitamin C  1 tablet Per Tube Daily  . Chlorhexidine Gluconate Cloth  6 each Topical Q0600  . clotrimazole  1 Applicatorful Vaginal QHS  . epoetin (EPOGEN/PROCRIT) injection  10,000 Units Intravenous Q M,W,F-HD  . feeding supplement (PRO-STAT SUGAR FREE 64)  30 mL Per Tube Daily  . ferrous gluconate  324 mg Oral Daily  . fluticasone  2 spray Each Nare Daily  . free water  20 mL Per Tube Q4H  . insulin aspart  0-15 Units Subcutaneous Q4H  . levothyroxine  100 mcg Per Tube QAC breakfast  . mouth rinse  15 mL Mouth Rinse BID  . pantoprazole (PROTONIX) IV  40 mg Intravenous Q12H  . psyllium  1 packet  Per Tube TID  . zinc sulfate  220 mg Per Tube Daily   sodium chloride, acetaminophen, ipratropium-albuterol, ondansetron (ZOFRAN) IV, traMADol  Assessment/ Plan:  64 y.o. female with  end-stage renal disease on hemodialysis, atrial fibrillation, chronic diastolic congestive heart failure, coronary artery disease status post CABG, diabetes mellitus type II, hypertension, hyperlipidemia, hypothyroidism, Covid positive in July 2020, recent nursing home stay in [place], Prolonged hospitalization for MRSA bacteremia/sepsis  admitted on 02/09/2019 for ESRD (end stage renal disease) ([place]) [N18.6] Altered mental status, unspecified altered mental status type [R41.82] Chest pain, unspecified type [R07.9] Sepsis,  due to unspecified organism, unspecified whether acute organ dysfunction present ([place]) [A41.9]  [facility] MWF [facility]   #End-stage renal disease Continue hemodialysis via PermCath on MWF schedule  #Acute encephalopathy Slow improvement  #MRSA sepsis Right arm AV graft removed this admission on March 04, 2019  #Chronic hypotension We will consider restarting midodrine if BP drops again Also getting IV albumin intermittently during dialysis  #Anemia of chronic kidney disease Epogen scheduled with dialysis Added iron tab with Peg tube   LOS: [place] 1/17/202112:58 PM  [place], [place]  Note: This note was prepared with Dragon dictation. Any transcription errors are unintentional

## 2019-03-23 LAB — RENAL FUNCTION PANEL
Albumin: 2 g/dL — ABNORMAL LOW (ref 3.5–5.0)
Anion gap: 10 (ref 5–15)
BUN: 59 mg/dL — ABNORMAL HIGH (ref 8–23)
CO2: 26 mmol/L (ref 22–32)
Calcium: 8.6 mg/dL — ABNORMAL LOW (ref 8.9–10.3)
Chloride: 100 mmol/L (ref 98–111)
Creatinine, Ser: 3.99 mg/dL — ABNORMAL HIGH (ref 0.44–1.00)
GFR calc Af Amer: 13 mL/min — ABNORMAL LOW (ref >60)
GFR calc non Af Amer: 11 mL/min — ABNORMAL LOW (ref >60)
Glucose, Bld: 176 mg/dL — ABNORMAL HIGH (ref 70–99)
Phosphorus: 4.5 mg/dL (ref 2.5–4.6)
Potassium: 4.2 mmol/L (ref 3.5–5.1)
Sodium: 136 mmol/L (ref 135–145)

## 2019-03-23 LAB — GLUCOSE, CAPILLARY
Glucose-Capillary: 182 mg/dL — ABNORMAL HIGH (ref 70–99)
Glucose-Capillary: 167 mg/dL — ABNORMAL HIGH (ref 70–99)
Glucose-Capillary: 198 mg/dL — ABNORMAL HIGH (ref 70–99)
Glucose-Capillary: 102 mg/dL — ABNORMAL HIGH (ref 70–99)
Glucose-Capillary: 157 mg/dL — ABNORMAL HIGH (ref 70–99)

## 2019-03-23 LAB — VDRL, CSF: VDRL Quant, CSF: NONREACTIVE

## 2019-03-23 LAB — HEMOGLOBIN AND HEMATOCRIT, BLOOD
HCT: 26.4 % — ABNORMAL LOW (ref 36.0–46.0)
Hemoglobin: 8.3 g/dL — ABNORMAL LOW (ref 12.0–15.0)

## 2019-03-23 MED ORDER — RISAQUAD PO CAPS
2.0000 | ORAL_CAPSULE | Freq: Two times a day (BID) | ORAL | Status: DC
  Administered 2019-03-23 – 2019-04-19 (×48): 2 via ORAL
  Filled 2019-03-23 (×50): qty 2

## 2019-03-23 NOTE — Progress Notes (Signed)
Nutrition Follow-up  DOCUMENTATION CODES:   Obesity unspecified  INTERVENTION:   Continue Osmolite 1.5 _0 /hr + prostat liquid protein 30 ml daily via tube, supplement provides 100 kcal, 15 grams protein.  Free water flushes 19m q4 hours to maintain tube patency   Regimen provides 1900kcal/day, 90g/day protein, 10932mday free water.   B-complex with C daily via tube  Metamucil 1 packet TID via tube   Risaquad per tube BID  NUTRITION DIAGNOSIS:   Increased nutrient needs related to chronic illness(ESRD on HD) as evidenced by estimated needs. Ongoing.  GOAL:   Patient will meet greater than or equal to 90% of their needs -met with tube feeds   MONITOR:   Labs, Weight trends, TF tolerance, Skin, I & O's  ASSESSMENT:   [age].o. female with end-stage renal disease on HD, atrial fibrillation, chronic diastolic congestive heart failure, CAD status post PCI, CABG,  type 2 diabetes, hypertension, hyperlipidemia, hypothyroidism, Covid positive in July 2020 admitted with AMS and found to have MRSA positive blood cultures   Pt s/p PEG placement 12/29  Pt tolerating tube feeds well at goal rate via PEG. Pt continues to have diarrhea via her rectal tube. No improvement with metamucil 3 times daily. Will add daily probiotics. Pt continues to be somnolent and confused. Per chart, pt appears fairly weight stable; no new weight since 1/13.  Medications reviewed and include: aspirin, B complex with C, epogen, heparin, insulin, synthroid, protonix, zinc, heparin, vancomycin, unasyn    Labs reviewed: BUN 46(H), creat 3.22(H)- 1/17 Hgb 8.4(L), Hct 26.0(L)- 1/17 cbgs- 141, 172, 180, 172, 167 x 24 hrs  Diet Order:   Diet Order            Diet NPO time specified  Diet effective now             EDUCATION NEEDS:   Not appropriate for education at this time  Skin:  Skin Assessment: Skin Integrity Issues:(stg II right buttocks; DTI right heel; unstageable left heel)  Last BM:   1/18- type 7  Height:   Ht Readings from Last 1 Encounters:  03/04/19 5' 4.02" (1.626 m)   Weight:   Wt Readings from Last 1 Encounters:  03/18/19 84.3 kg   Ideal Body Weight:  45 kg  BMI:  Body mass index is 31.88 kg/m.  Estimated Nutritional Needs:   Kcal:  1700-1900kcal/day  Protein:  85-95g/day  Fluid:  UOP +1L  CaKoleen DistanceS, RD, LDN Pager #- [phone]#- [phone] Hours Pager: [phone]

## 2019-03-23 NOTE — Progress Notes (Signed)
[place], [place] 03/23/19  Subjective:   Hospital day # 42  Doing fair.  No acute events reported by nursing staff.  Patient's opens her eyes but does not follow any commands Patient opens eyes, did not follow any commands  Last hemodialysis was on Friday.  2000 cc removed Femoral central line has been removed Tube feeds continued at 50 cc/h  Renal: 01/17 0701 - 01/18 0700 In: 200 [IV Piggyback:200] Out: 0  Lab Results  Component Value Date   CREATININE 3.22 (H) 03/22/2019   CREATININE 3.02 (H) 03/20/2019   CREATININE 2.16 (H) 03/19/2019     Objective:  Vital signs in last 24 hours:  Temp:  [98.3 F (36.8 C)-98.8 F (37.1 C)] 98.3 F (36.8 C) (01/18 1226) Pulse Rate:  [84-87] 84 (01/18 1226) Resp:  [18-20] 18 (01/18 1226) BP: (123-154)/(54-71) 123/71 (01/18 1226) SpO2:  [98 %-100 %] 100 % (01/18 1226)  Weight change:  Filed Weights   03/16/19 1400 03/18/19 0850 03/18/19 1206  Weight: 86.5 kg 88.4 kg 84.3 kg    Intake/Output:    Intake/Output Summary (Last 24 hours) at 03/23/2019 1317 Last data filed at 03/23/2019 0552 Gross per 24 hour  Intake 200 ml  Output 0 ml  Net 200 ml     Physical Exam: General:  Chronically ill-appearing, laying in the bed  HEENT  anicteric, moist oral mucous membranes  Pulm/lungs  shallow breathing effort, no crackles  CVS/Heart  irregular rhythm, no rub  Abdomen:   Soft, nontender, PEG tube in place  Extremities:  Bilateral upper extremity 2+ edema  Neurologic:  opens eyes, not following commands  Skin:  Ecchymosis  Access:  PermCath       Basic Metabolic Panel:  Recent Labs  Lab 03/18/19 0621 03/18/19 0621 03/18/19 1001 03/18/19 1001 03/19/19 0449 03/20/19 0819 03/22/19 0817  NA 137  --  136  --  136 136 137  K 4.2  --  3.2*  --  4.1 4.3 3.9  CL 100  --  98  --  101 101 101  CO2 28  --  29  --  29 26 26   GLUCOSE 170*  --  156*  --  154* 143* 194*  BUN 43*  --  24*  --  28* 43*  46*  CREATININE 2.99*  --  1.75*  --  2.16* 3.02* 3.22*  CALCIUM 8.1*   < > 7.9*   < > 7.9* 8.3* 8.6*  MG  --   --  1.7  --   --   --   --   PHOS  --   --  2.4*  --   --   --  3.8   < > = values in this interval not displayed.     CBC: Recent Labs  Lab 03/18/19 0621 03/18/19 0621 03/18/19 1001 03/19/19 0633 03/20/19 0539 03/20/19 1215 03/22/19 0817  WBC 5.7  --  5.6 4.6 4.4 5.0  --   HGB 8.2*   < > 8.7* 8.7* 7.8* 8.3* 8.4*  HCT 25.3*   < > 26.9* 27.1* 24.7* 26.4* 26.0*  MCV 95.8  --  95.4 96.8 96.9 99.2  --   PLT 220  --  203 228 235 228  --    < > = values in this interval not displayed.      Lab Results  Component Value Date   HEPBSAG NON REACTIVE 03/18/2019      Microbiology:  Recent Results (from  the past 240 hour(s))  Wet prep, genital     Status: Abnormal   Collection Time: 03/16/19 10:01 AM  Result Value Ref Range Status   Yeast Wet Prep HPF POC PRESENT (A) NONE SEEN Final   Trich, Wet Prep NONE SEEN NONE SEEN Final   Clue Cells Wet Prep HPF POC NONE SEEN NONE SEEN Final   WBC, Wet Prep HPF POC MANY (A) NONE SEEN Final   Sperm NONE SEEN  Final    Comment: Performed at [facility], [address], [place], [place] [address]  Aerobic Culture (superficial specimen)     Status: None   Collection Time: 03/17/19  5:13 PM   Specimen: Vaginal Fluid  Result Value Ref Range Status   Specimen Description   Final    VAGINA Performed at [facility], [address] [place], [place] [address]    Special Requests   Final    NONE Performed at [facility], [address] [place], [place] [address]    Gram Stain   Final    MODERATE WBC PRESENT,BOTH PMN AND MONONUCLEAR FEW GRAM POSITIVE COCCI IN PAIRS FEW GRAM NEGATIVE RODS Performed at [facility], [place] [address] [place], [place] [address]    Culture   Final    ABUNDANT ESCHERICHIA COLI MODERATE KLEBSIELLA PNEUMONIAE    Report Status 03/21/2019 FINAL  Final    Organism ID, Bacteria ESCHERICHIA COLI  Final   Organism ID, Bacteria KLEBSIELLA PNEUMONIAE  Final      Susceptibility   Escherichia coli - MIC*    AMPICILLIN 4 SENSITIVE Sensitive     CEFAZOLIN <=4 SENSITIVE Sensitive     CEFEPIME <=0.12 SENSITIVE Sensitive     CEFTAZIDIME <=1 SENSITIVE Sensitive     CEFTRIAXONE <=0.25 SENSITIVE Sensitive     CIPROFLOXACIN <=0.25 SENSITIVE Sensitive     GENTAMICIN <=1 SENSITIVE Sensitive     IMIPENEM <=0.25 SENSITIVE Sensitive     TRIMETH/SULFA <=20 SENSITIVE Sensitive     AMPICILLIN/SULBACTAM <=2 SENSITIVE Sensitive     PIP/TAZO <=4 SENSITIVE Sensitive     * ABUNDANT ESCHERICHIA COLI   Klebsiella pneumoniae - MIC*    AMPICILLIN >=32 RESISTANT Resistant     CEFAZOLIN <=4 SENSITIVE Sensitive     CEFEPIME <=0.12 SENSITIVE Sensitive     CEFTAZIDIME <=1 SENSITIVE Sensitive     CEFTRIAXONE <=0.25 SENSITIVE Sensitive     CIPROFLOXACIN <=0.25 SENSITIVE Sensitive     GENTAMICIN <=1 SENSITIVE Sensitive     IMIPENEM <=0.25 SENSITIVE Sensitive     TRIMETH/SULFA <=20 SENSITIVE Sensitive     AMPICILLIN/SULBACTAM 4 SENSITIVE Sensitive     PIP/TAZO <=4 SENSITIVE Sensitive     * MODERATE KLEBSIELLA PNEUMONIAE  Anaerobic culture     Status: None (Preliminary result)   Collection Time: 03/20/19 12:40 PM   Specimen: PATH Cytology CSF; Cerebrospinal Fluid  Result Value Ref Range Status   Specimen Description   Final    CSF Performed at [facility], [address] [place], [place] [address]    Special Requests   Final    NONE Performed at [facility], [address] [place], [place] [address]    Gram Stain   Final    WBC PRESENT, PREDOMINANTLY MONONUCLEAR NO ORGANISMS SEEN CYTOSPIN SMEAR Performed at [facility], [place]. [address] [place], [place] [address]    Culture   Final    NO ANAEROBES ISOLATED; CULTURE IN PROGRESS FOR 5 DAYS   Report  Status PENDING  Incomplete  CSF culture     Status: None (Preliminary result)    Collection Time: 03/20/19 12:40 PM   Specimen: PATH Cytology CSF; Cerebrospinal Fluid  Result Value Ref Range Status   Specimen Description   Final    CSF Performed at [facility], [address], [place], [place] [address]    Special Requests   Final    NONE Performed at [facility], [address] [place], [place] [address]    Gram Stain   Final    WBC PRESENT, PREDOMINANTLY MONONUCLEAR NO ORGANISMS SEEN CYTOSPIN SMEAR    Culture   Final    NO GROWTH 3 DAYS Performed at [facility], [place] [address] [place], [place] [address]    Report Status PENDING  Incomplete    Coagulation Studies: No results for input(s): LABPROT, INR in the last 72 hours.  Urinalysis: No results for input(s): COLORURINE, LABSPEC, PHURINE, GLUCOSEU, HGBUR, BILIRUBINUR, KETONESUR, PROTEINUR, UROBILINOGEN, NITRITE, LEUKOCYTESUR in the last 72 hours.  Invalid input(s): APPERANCEUR    Imaging: No results found.   Medications:   . sodium chloride 20 mL (03/23/19 1301)  . ampicillin-sulbactam (UNASYN) IV Stopped (03/23/19 0532)  . feeding supplement (OSMOLITE 1.5 CAL) 1,000 mL (03/22/19 1501)  . vancomycin Stopped (03/20/19 1849)   . acidophilus  2 capsule Oral BID  . alteplase  2 mg Intracatheter Once  . alteplase  2 mg Intracatheter Once  . apixaban  5 mg Oral BID  . aspirin  81 mg Per Tube Daily  . B-complex with vitamin C  1 tablet Per Tube Daily  . Chlorhexidine Gluconate Cloth  6 each Topical Q0600  . clotrimazole  1 Applicatorful Vaginal QHS  . epoetin (EPOGEN/PROCRIT) injection  10,000 Units Intravenous Q M,W,F-HD  . feeding supplement (PRO-STAT SUGAR FREE 64)  30 mL Per Tube Daily  . ferrous gluconate  324 mg Oral Daily  . fluticasone  2 spray Each Nare Daily  . free water  20 mL Per Tube Q4H  . insulin aspart  0-15 Units Subcutaneous Q4H  . levothyroxine  100 mcg Per Tube QAC breakfast  . mouth rinse  15 mL Mouth Rinse BID  . pantoprazole (PROTONIX) IV   40 mg Intravenous Q12H  . psyllium  1 packet Per Tube TID  . zinc sulfate  220 mg Per Tube Daily   sodium chloride, acetaminophen, ipratropium-albuterol, ondansetron (ZOFRAN) IV, traMADol  Assessment/ Plan:  64 y.o. female with  end-stage renal disease on hemodialysis, atrial fibrillation, chronic diastolic congestive heart failure, coronary artery disease status post CABG, diabetes mellitus type II, hypertension, hyperlipidemia, hypothyroidism, Covid positive in July 2020, recent nursing home stay in [place], Prolonged hospitalization for MRSA bacteremia/sepsis  admitted on 02/09/2019 for ESRD (end stage renal disease) ([place]) [N18.6] Altered mental status, unspecified altered mental status type [R41.82] Chest pain, unspecified type [R07.9] Sepsis, due to unspecified organism, unspecified whether acute organ dysfunction present ([place]) [A41.9]  [facility] MWF [facility]   #End-stage renal disease Continue hemodialysis via PermCath on MWF schedule  #Acute encephalopathy Slow improvement  #MRSA sepsis Right arm AV graft removed this admission on March 04, 2019  #Chronic hypotension We will consider restarting midodrine if BP drops again Also getting IV albumin intermittently during dialysis  #Anemia of chronic kidney disease Epogen scheduled with dialysis Added iron tab with Peg tube   LOS: [place] 1/18/20211:17 PM  [place], [place]  Note: This  note was prepared with Dragon dictation. Any transcription errors are unintentional

## 2019-03-23 NOTE — Progress Notes (Signed)
PROGRESS NOTE    Jamie Arias  [id number] DOB: [birth year] DOA: 02/09/2019 PCP: [name], MD   Brief Narrative:  Jamie Arias is a 64 y.o. white female with end-stage renal disease on hemodialysis, atrial fibrillation, chronic diastolic congestive heart failure, coronary artery disease status post CABG, diabetes mellitus type II, hypertension, hyperlipidemia, hypothyroidism, Covid positive in July 2020, recent nursing home stay in [place]. Prolonged hospitalization for MRSA bacteremia/sepsis and now with acute encephalopathy.  Subjective: No acute issues overnight or this morning.  Assessment & Plan:   Principal Problem:   Sepsis due to methicillin resistant Staphylococcus aureus (MRSA) ([place]) Active Problems:   Type 2 diabetes mellitus with ESRD (end-stage renal disease) (HCC)   Atrial fibrillation, chronic (HCC)   Hypotension   Chronic diastolic CHF (congestive heart failure) (HCC)   ESRD (end stage renal disease) (HCC)   Altered level of consciousness   Decubitus ulcer of heel, bilateral, stage 2 (HCC)   MRSA bacteremia   Arm DVT (deep venous thromboembolism), acute, left (HCC)   Acute encephalopathy   Acute metabolic encephalopathy   Acute blood loss anemia   AF (paroxysmal atrial fibrillation) ([place])   Diarrhea   Oropharyngeal dysphagia   Abscess  Sepsis secondary to MRSA bacteremia.  No obvious source.  Right arm graft was removed on 03/04/2019. -Continue with vancomycin for 6-week, will finish her course on 04/01/2019.      Acute metabolic encephalopathy:  mental status remains unchanged -  MRI of brain and EEG negative. Patient remained afebrile with no leukocytosis.  B12 within normal range, folate and B1 results are pending.  -  She was started on thiamine challenge by neurology but not much improvement. - Because of persistent encephalopathy - Neuro recommends LP   - she needs to be off Eliquis for 48 hrs and off of heparin - IR  performed LP  on 03/20/19 AM: CSF shows only 8 WBC, Protein elevated at 73, Glucose 83. Negative for Toxo  -CSF panel is a send out; will follow result -Appreciate IR assistance - Repeat TSH was elevated. - Increased dose of Synthroid to 100 mcg -Overall prognosis seems poor.  -Palliative care consulted.  They have been following the patient and in touch with husband.  In the meantime continue medical treatment.  Bleeding from catheter site with multiple ecchymosis.  No new episode over the past 24 to 48 hours -  warfarin was discontinued due to concern for necrosis of skin.  Hemoglobin dropped to 6.8; improved to 8.7 and remains mostly above 8 -Resumed eliquis; so far no new episode of bleeding   Vaginal discharge: - New episode  There was some nursing concern of vaginal discharge  -there is some yeast but Gyn feels this may be colonization - may not need treatment. She is not symptomatic -Since culture grew E. coli and Klebsiella; contamination questionable  - Unasyn also started on 03/21/2019 per ID  Anemia.  Status post transfusion 1 unit of PRBC on 03/17/2019 -  hemoglobin stable above 8.  Iron studies consistent with anemia of chronic illness.  MCV elevated.  -B12 within normal range -Continue EPO with dialysis.  Hypotension.  Resolved now  ESRD (M [facility]).   Currently using tunneled catheter as graft was removed due to MRSA bacteremia. -Continue with scheduled dialysis per nephrology  Left arm DVT.  Currently on Eliquis - recent diagnosis of left arm DVT on 03/03/2019. -Discontinue Coumadin due to concern for Coumadin toxicity/side effect due to her  skin necrotic lesions.  Hypothyroidism.  Repeat TSH elevated at 17.82. -Increased the dose of Synthroid to 100. - repeat TSH in 2 to 3 weeks.  Paroxysmal A. Fib.. Currently in sinus. Amiodarone was discontinued. -Continue Eliquis  Type 2 diabetes. -Continue SSI.  Chronic diastolic heart failure.  Volume being managed by dialysis.  Bilateral  decubitus ulcers.  Present on admission. -Continue with wound care.  Nutrition.  PEG placed 03/03/2019.  Continue tube feeding.  Acute hypoxic respiratory failure.  Resolved.  Patient was intubated initially, extubated on 03/05/2019. -On room air now  Family communication: Spoke with patient's husband [name] on 03/22/2019.  Answered to all questions.  Updated.  Expressed understanding.  Objective: Vitals:   03/22/19 1244 03/22/19 1942 03/23/19 0433 03/23/19 1226  BP: (!) 148/58 (!) 134/54 (!) 154/54 123/71  Pulse: 87 87 84 84  Resp: 19 20 18 18   Temp: 98.7 F (37.1 C) 98.8 F (37.1 C) 98.5 F (36.9 C) 98.3 F (36.8 C)  TempSrc: Axillary Oral Oral Axillary  SpO2: 100% 98% 100% 100%  Weight:      Height:        Intake/Output Summary (Last 24 hours) at 03/23/2019 1241 Last data filed at 03/23/2019 0552 Gross per 24 hour  Intake 200 ml  Output 0 ml  Net 200 ml   Filed Weights   03/16/19 1400 03/18/19 0850 03/18/19 1206  Weight: 86.5 kg 88.4 kg 84.3 kg    Examination:  General exam: Chronically ill-appearing lady, minimal response to painful stimuli, in no acute distress. Respiratory system: Clear to auscultation. Respiratory effort normal. Cardiovascular system: S1 & S2 heard, RRR. No JVD, murmurs, rubs, gallops or clicks. Gastrointestinal system: Soft, nontender, nondistended, bowel sounds positive. Central nervous system: Unable to assess as patient is minimally responsive and not following any commands. Extremities: Bilateral upper extremity edema, no cyanosis, pulses intact and symmetrical. Skin: Decubitus ulcers on both heels.  Multiple ecchymosis on all extremities and upper chest, appears healing now. Psychiatry: Judgement and insight appear impaired.  DVT prophylaxis: Eliquis Code Status: DNR Family Communication: Husband was updated at bedside. Disposition Plan: Waiting for bed offer at [facility]  Consultants:   Nephrology  Infectious disease  Vascular  surgery  Neurology  Critical care specialist   Procedures:  Antimicrobials:  Vancomycin.  Data Reviewed: I have personally reviewed following labs and imaging studies  CBC: Recent Labs  Lab 03/18/19 0621 03/18/19 0621 03/18/19 1001 03/19/19 0633 03/20/19 0539 03/20/19 1215 03/22/19 0817  WBC 5.7  --  5.6 4.6 4.4 5.0  --   HGB 8.2*   < > 8.7* 8.7* 7.8* 8.3* 8.4*  HCT 25.3*   < > 26.9* 27.1* 24.7* 26.4* 26.0*  MCV 95.8  --  95.4 96.8 96.9 99.2  --   PLT 220  --  203 228 235 228  --    < > = values in this interval not displayed.   Basic Metabolic Panel: Recent Labs  Lab 03/18/19 0621 03/18/19 1001 03/19/19 0449 03/20/19 0819 03/22/19 0817  NA 137 136 136 136 137  K 4.2 3.2* 4.1 4.3 3.9  CL 100 98 101 101 101  CO2 28 29 29 26 26   GLUCOSE 170* 156* 154* 143* 194*  BUN 43* 24* 28* 43* 46*  CREATININE 2.99* 1.75* 2.16* 3.02* 3.22*  CALCIUM 8.1* 7.9* 7.9* 8.3* 8.6*  MG  --  1.7  --   --   --   PHOS  --  2.4*  --   --  3.8   GFR: Estimated Creatinine Clearance: 18.3 mL/min (A) (by C-G formula based on SCr of 3.22 mg/dL (H)). Liver Function Tests: Recent Labs  Lab 03/18/19 1001 03/22/19 0817  ALBUMIN 1.8* 2.0*   No results for input(s): LIPASE, AMYLASE in the last 168 hours. No results for input(s): AMMONIA in the last 168 hours. Coagulation Profile: Recent Labs  Lab 03/20/19 0819  INR 1.1   Cardiac Enzymes: Recent Labs  Lab 03/21/19 1909  CKTOTAL 26*   BNP (last 3 results) No results for input(s): PROBNP in the last 8760 hours. HbA1C: No results for input(s): HGBA1C in the last 72 hours. CBG: Recent Labs  Lab 03/22/19 1649 03/22/19 1943 03/22/19 2348 03/23/19 0805 03/23/19 1223  GLUCAP 172* 180* 172* 167* 198*   Lipid Profile: No results for input(s): CHOL, HDL, LDLCALC, TRIG, CHOLHDL, LDLDIRECT in the last 72 hours. Thyroid Function Tests: No results for input(s): TSH, T4TOTAL, FREET4, T3FREE, THYROIDAB in the last 72 hours. Anemia  Panel: No results for input(s): VITAMINB12, FOLATE, FERRITIN, TIBC, IRON, RETICCTPCT in the last 72 hours. Sepsis Labs: No results for input(s): PROCALCITON, LATICACIDVEN in the last 168 hours.  Recent Results (from the past 240 hour(s))  Wet prep, genital     Status: Abnormal   Collection Time: 03/16/19 10:01 AM  Result Value Ref Range Status   Yeast Wet Prep HPF POC PRESENT (A) NONE SEEN Final   Trich, Wet Prep NONE SEEN NONE SEEN Final   Clue Cells Wet Prep HPF POC NONE SEEN NONE SEEN Final   WBC, Wet Prep HPF POC MANY (A) NONE SEEN Final   Sperm NONE SEEN  Final    Comment: Performed at [facility], [address], [place], [place] [address]  Aerobic Culture (superficial specimen)     Status: None   Collection Time: 03/17/19  5:13 PM   Specimen: Vaginal Fluid  Result Value Ref Range Status   Specimen Description   Final    VAGINA Performed at [facility], [address], [place], [place] [address]    Special Requests   Final    NONE Performed at [facility], [address] [place], [place] [address]    Gram Stain   Final    MODERATE WBC PRESENT,BOTH PMN AND MONONUCLEAR FEW GRAM POSITIVE COCCI IN PAIRS FEW GRAM NEGATIVE RODS Performed at [facility], [place] [address], [place], [place] [address]    Culture   Final    ABUNDANT ESCHERICHIA COLI MODERATE KLEBSIELLA PNEUMONIAE    Report Status 03/21/2019 FINAL  Final   Organism ID, Bacteria ESCHERICHIA COLI  Final   Organism ID, Bacteria KLEBSIELLA PNEUMONIAE  Final      Susceptibility   Escherichia coli - MIC*    AMPICILLIN 4 SENSITIVE Sensitive     CEFAZOLIN <=4 SENSITIVE Sensitive     CEFEPIME <=0.12 SENSITIVE Sensitive     CEFTAZIDIME <=1 SENSITIVE Sensitive     CEFTRIAXONE <=0.25 SENSITIVE Sensitive     CIPROFLOXACIN <=0.25 SENSITIVE Sensitive     GENTAMICIN <=1 SENSITIVE Sensitive     IMIPENEM <=0.25 SENSITIVE Sensitive     TRIMETH/SULFA <=20 SENSITIVE Sensitive      AMPICILLIN/SULBACTAM <=2 SENSITIVE Sensitive     PIP/TAZO <=4 SENSITIVE Sensitive     * ABUNDANT ESCHERICHIA COLI   Klebsiella pneumoniae - MIC*    AMPICILLIN >=32 RESISTANT Resistant     CEFAZOLIN <=4 SENSITIVE Sensitive     CEFEPIME <=0.12 SENSITIVE Sensitive  CEFTAZIDIME <=1 SENSITIVE Sensitive     CEFTRIAXONE <=0.25 SENSITIVE Sensitive     CIPROFLOXACIN <=0.25 SENSITIVE Sensitive     GENTAMICIN <=1 SENSITIVE Sensitive     IMIPENEM <=0.25 SENSITIVE Sensitive     TRIMETH/SULFA <=20 SENSITIVE Sensitive     AMPICILLIN/SULBACTAM 4 SENSITIVE Sensitive     PIP/TAZO <=4 SENSITIVE Sensitive     * MODERATE KLEBSIELLA PNEUMONIAE  Anaerobic culture     Status: None (Preliminary result)   Collection Time: 03/20/19 12:40 PM   Specimen: PATH Cytology CSF; Cerebrospinal Fluid  Result Value Ref Range Status   Specimen Description   Final    CSF Performed at [facility], [address], [place], [place] [address]    Special Requests   Final    NONE Performed at [facility], [address] [place], [place] [address]    Gram Stain   Final    WBC PRESENT, PREDOMINANTLY MONONUCLEAR NO ORGANISMS SEEN CYTOSPIN SMEAR Performed at [facility], [place] [address] [place], [place] [address]    Culture   Final    NO ANAEROBES ISOLATED; CULTURE IN PROGRESS FOR 5 DAYS   Report Status PENDING  Incomplete  CSF culture     Status: None (Preliminary result)   Collection Time: 03/20/19 12:40 PM   Specimen: PATH Cytology CSF; Cerebrospinal Fluid  Result Value Ref Range Status   Specimen Description   Final    CSF Performed at [facility], [address], [place], [place] [address]    Special Requests   Final    NONE Performed at [facility], [address] [place], [place] [address]    Gram Stain   Final    WBC PRESENT, PREDOMINANTLY MONONUCLEAR NO ORGANISMS SEEN CYTOSPIN SMEAR    Culture   Final    NO GROWTH 3 DAYS Performed at [facility], [place] [address], [place], [place] [address]    Report Status PENDING  Incomplete     Radiology Studies: No results found.  Scheduled Meds: . acidophilus  2 capsule Oral BID  . alteplase  2 mg Intracatheter Once  . alteplase  2 mg Intracatheter Once  . apixaban  5 mg Oral BID  . aspirin  81 mg Per Tube Daily  . B-complex with vitamin C  1 tablet Per Tube Daily  . Chlorhexidine Gluconate Cloth  6 each Topical Q0600  . clotrimazole  1 Applicatorful Vaginal QHS  . epoetin (EPOGEN/PROCRIT) injection  10,000 Units Intravenous Q M,W,F-HD  . feeding supplement (PRO-STAT SUGAR FREE 64)  30 mL Per Tube Daily  . ferrous gluconate  324 mg Oral Daily  . fluticasone  2 spray Each Nare Daily  . free water  20 mL Per Tube Q4H  . insulin aspart  0-15 Units Subcutaneous Q4H  . levothyroxine  100 mcg Per Tube QAC breakfast  . mouth rinse  15 mL Mouth Rinse BID  . pantoprazole (PROTONIX) IV  40 mg Intravenous Q12H  . psyllium  1 packet Per Tube TID  . zinc sulfate  220 mg Per Tube Daily   Continuous Infusions: . sodium chloride 250 mL (03/18/19 1635)  . ampicillin-sulbactam (UNASYN) IV Stopped (03/23/19 0532)  . feeding supplement (OSMOLITE 1.5 CAL) 1,000 mL (03/22/19 1501)  . vancomycin Stopped (03/20/19 1849)     LOS: 42 days   Time spent: 35 min.   Thornell Mule, MD [facility] Pager [phone]  If 7PM-7AM, please contact night-coverage [email or web address] Password  [facility] 03/23/2019, 12:41 PM   This record has been created using Systems analyst. Errors have been sought and corrected,but may not always be located. Such creation errors do not reflect on the standard of care.

## 2019-03-23 NOTE — Progress Notes (Signed)
Daily Progress Note   Patient Name: Jamie Arias       Date: 03/23/2019 DOB: 04-Aug-1955  Age: 64 y.o. MRN#: [id number] Attending Physician: Thornell Mule, MD Primary Care Physician: [name], MD Admit Date: 02/09/2019  Reason for Consultation/Follow-up: Establishing goals of care  Subjective: No family at bedside. Attempted to call husband unsuccessfully. Later returned to room and patient is in HD, with husband sitting in room. He states he believes his wife had a stroke. He states he wants whatever is wrong with his wife found, and fixed if possible. He understands medical staff is working toward this goal. He states if whatever the issue is cannot be fixed, he will need help taking with her children, and does not feel that she would be happy with her current QOL.   Length of Stay: 42  Current Medications: Scheduled Meds:  . acidophilus  2 capsule Oral BID  . alteplase  2 mg Intracatheter Once  . alteplase  2 mg Intracatheter Once  . apixaban  5 mg Oral BID  . aspirin  81 mg Per Tube Daily  . B-complex with vitamin C  1 tablet Per Tube Daily  . Chlorhexidine Gluconate Cloth  6 each Topical Q0600  . clotrimazole  1 Applicatorful Vaginal QHS  . epoetin (EPOGEN/PROCRIT) injection  10,000 Units Intravenous Q M,W,F-HD  . feeding supplement (PRO-STAT SUGAR FREE 64)  30 mL Per Tube Daily  . ferrous gluconate  324 mg Oral Daily  . fluticasone  2 spray Each Nare Daily  . free water  20 mL Per Tube Q4H  . insulin aspart  0-15 Units Subcutaneous Q4H  . levothyroxine  100 mcg Per Tube QAC breakfast  . mouth rinse  15 mL Mouth Rinse BID  . pantoprazole (PROTONIX) IV  40 mg Intravenous Q12H  . psyllium  1 packet Per Tube TID  . zinc sulfate  220 mg Per Tube Daily    Continuous  Infusions: . sodium chloride 20 mL (03/23/19 1301)  . ampicillin-sulbactam (UNASYN) IV Stopped (03/23/19 0532)  . feeding supplement (OSMOLITE 1.5 CAL) 1,000 mL (03/22/19 1501)  . vancomycin Stopped (03/20/19 1849)    PRN Meds: sodium chloride, acetaminophen, ipratropium-albuterol, ondansetron (ZOFRAN) IV, traMADol  Physical Exam: Resting with eyes closed.  Respirations even and unlabored.  Vital Signs: BP (!) 93/58   Pulse 96   Temp 98.5 F (36.9 C) (Axillary)   Resp 18   Ht 5' 4.02" (1.626 m)   Wt 84.3 kg   SpO2 99%   BMI 31.88 kg/m  SpO2: SpO2: 99 % O2 Device: O2 Device: Room Air O2 Flow Rate: O2 Flow Rate (L/min): 1 L/min  Intake/output summary:   Intake/Output Summary (Last 24 hours) at 03/23/2019 1528 Last data filed at 03/23/2019 [id number] Gross per 24 hour  Intake 200 ml  Output 0 ml  Net 200 ml   LBM: Last BM Date: 03/23/19 Baseline Weight: Weight: 59 kg Most recent weight: Weight: 84.3 kg       Palliative Assessment/Data:     Flowsheet Rows     Most Recent Value  Intake Tab  Referral Department  Hospitalist  Unit at Time of Referral  ICU  Palliative Care Primary Diagnosis  Neurology  Date Notified  02/18/19  Palliative Care Type  New Palliative care  Reason for referral  Clarify Goals of Care  Date of Admission  02/09/19  Date first seen by Palliative Care  02/19/19  # of days Palliative referral response time  1 Day(s)  # of days IP prior to Palliative referral  9  Clinical Assessment  Psychosocial & Spiritual Assessment  Palliative Care Outcomes      Patient Active Problem List   Diagnosis Date Noted  . Abscess   . AF (paroxysmal atrial fibrillation) ([place])   . Diarrhea   . Oropharyngeal dysphagia   . Acute metabolic encephalopathy   . Acute blood loss anemia   . Arm DVT (deep venous thromboembolism), acute, left ([place]) 02/13/2019  . Acute encephalopathy 02/13/2019  . Decubitus ulcer of heel, bilateral, stage 2 ([place]) 02/10/2019    . Sepsis due to methicillin resistant Staphylococcus aureus (MRSA) ([place]) 02/10/2019  . MRSA bacteremia 02/10/2019  . Altered level of consciousness 02/09/2019  . Adjustment disorder with depressed mood 08/31/2018  . COVID-19 virus infection 08/24/2018  . Acute on chronic respiratory failure with hypoxia ([place]) 08/24/2018  . UTI (urinary tract infection) 08/24/2018  . Allergic rhinitis 09/19/2017  . Uremia of renal origin 09/04/2017  . Chronic neck pain 07/25/2017  . Hyperkalemia 02/15/2017  . Complication from renal dialysis device 12/01/2016  . ESRD (end stage renal disease) ([place]) 01/19/2016  . GI bleed 10/25/2015  . Hypertensive heart disease 10/24/2015  . Chronic diastolic CHF (congestive heart failure) ([place]) 10/24/2015  . Unstable angina ([place]) 10/23/2015  . Hypotension 09/02/2015  . Chronic systolic CHF (congestive heart failure) ([place]) 09/02/2015  . Depression 07/27/2015  . Calculus of gallbladder with chronic cholecystitis without obstruction   . Bilateral carotid bruits 11/30/2014  . Low magnesium levels 08/10/2013  . Anemia 08/10/2013  . Constipation 08/10/2013  . Atrial fibrillation, chronic ([place]) 07/30/2013  . Coronary artery disease   . CAD (coronary artery disease) 07/02/2013  . Acute systolic heart failure ([place]) 06/30/2013  . NSTEMI (non-ST elevated myocardial infarction) ([place]) 06/29/2013  . Vertigo 08/25/2012  . Sleep disorder 05/23/2012  . Hypothyroid 04/27/2012  . HTN (hypertension) 04/27/2012  . HLD (hyperlipidemia) 04/27/2012  . Type 2 diabetes mellitus with ESRD (end-stage renal disease) ([place]) 04/27/2012  . Neuropathy 04/27/2012    Palliative Care Assessment & Plan    Recommendations/Plan:  DNR/DNI  Continue to treat the treatable.   Palliative medicine team will check in with husband weekly per conversation and his request.   He states her current QOL would not  be acceptable long term, and if the issue causing her altered mental status cannot be  corrected, he would want help talking with her children about care moving forward.    Code Status:    Code Status Orders  (From admission, onward)         Start     Ordered   02/21/19 0930  Do not attempt resuscitation (DNR)  Continuous    Question Answer Comment  In the event of cardiac or respiratory ARREST Do not call a "code blue"   In the event of cardiac or respiratory ARREST Do not perform Intubation, CPR, defibrillation or ACLS   In the event of cardiac or respiratory ARREST Use medication by any route, position, wound care, and other measures to relive pain and suffering. May use oxygen, suction and manual treatment of airway obstruction as needed for comfort.      02/21/19 0932        Code Status History    Date Active Date Inactive Code Status Order ID Comments User Context   02/09/2019 [id number] 02/21/2019 0932 Full Code [id number]  Thornell Mule, MD ED   08/24/2018 0022 09/15/2018 1720 Full Code [id number]  Shela Leff, MD Inpatient   09/04/2017 1510 09/06/2017 1920 Full Code [id number]  Epifanio Lesches, MD ED   02/15/2017 1243 02/16/2017 2023 Full Code [id number]  Saundra Shelling, MD Inpatient   10/23/2015 0828 10/23/2015 0908 Full Code [id number]  Saundra Shelling, MD Inpatient   09/03/2015 0130 09/03/2015 1619 Full Code [id number]  Lance Coon, MD Inpatient   07/02/2013 1327 07/14/2013 1540 Full Code [id number]  Nani Skillern, PA-C Inpatient   06/29/2013 2153 07/02/2013 1327 Full Code [id number]  Larey Dresser, MD Inpatient   Advance Care Planning Activity       Prognosis:  Poor overall   Thank you for allowing the Palliative Medicine Team to assist in the care of this patient.   Total Time 25 min Prolonged Time Billed  no      Greater than 50%  of this time was spent counseling and coordinating care related to the above assessment and plan.  Asencion Gowda, NP  Please contact Palliative Medicine Team phone at [phone] for questions and concerns.

## 2019-03-23 NOTE — Progress Notes (Signed)
Rectal tube fell out. Replaced rectal tube. Skin care provided, barrier cream placed, and new foam applied to sacral area.

## 2019-03-23 NOTE — Plan of Care (Deleted)
PMT note:  Attempted to call and speak with husband [name] to check in, answer questions, and offer support. No answer.

## 2019-03-24 LAB — GLUCOSE, CAPILLARY
Glucose-Capillary: 118 mg/dL — ABNORMAL HIGH (ref 70–99)
Glucose-Capillary: 121 mg/dL — ABNORMAL HIGH (ref 70–99)
Glucose-Capillary: 124 mg/dL — ABNORMAL HIGH (ref 70–99)
Glucose-Capillary: 135 mg/dL — ABNORMAL HIGH (ref 70–99)
Glucose-Capillary: 143 mg/dL — ABNORMAL HIGH (ref 70–99)
Glucose-Capillary: 74 mg/dL (ref 70–99)

## 2019-03-24 LAB — RENAL FUNCTION PANEL
Albumin: 1.9 g/dL — ABNORMAL LOW (ref 3.5–5.0)
Anion gap: 11 (ref 5–15)
BUN: 31 mg/dL — ABNORMAL HIGH (ref 8–23)
CO2: 28 mmol/L (ref 22–32)
Calcium: 8.6 mg/dL — ABNORMAL LOW (ref 8.9–10.3)
Chloride: 99 mmol/L (ref 98–111)
Creatinine, Ser: 2.82 mg/dL — ABNORMAL HIGH (ref 0.44–1.00)
GFR calc Af Amer: 20 mL/min — ABNORMAL LOW (ref >60)
GFR calc non Af Amer: 17 mL/min — ABNORMAL LOW (ref >60)
Glucose, Bld: 123 mg/dL — ABNORMAL HIGH (ref 70–99)
Phosphorus: 4 mg/dL (ref 2.5–4.6)
Potassium: 3.9 mmol/L (ref 3.5–5.1)
Sodium: 138 mmol/L (ref 135–145)

## 2019-03-24 LAB — HEMOGLOBIN AND HEMATOCRIT, BLOOD
HCT: 27 % — ABNORMAL LOW (ref 36.0–46.0)
Hemoglobin: 8.4 g/dL — ABNORMAL LOW (ref 12.0–15.0)

## 2019-03-24 LAB — CSF CULTURE W GRAM STAIN: Culture: NO GROWTH

## 2019-03-24 LAB — OLIGOCLONAL BANDS, CSF + SERM

## 2019-03-24 NOTE — Progress Notes (Signed)
[place], [place] 03/24/19  Subjective:   Hospital day # 43  Doing fair.  No acute events reported by nursing staff.   Patient's opens her eyes responded yes to one question Tube feeds continued at 50 cc/h  Renal: 01/18 0701 - 01/19 0700 In: 510 [NG/GT:510] Out: 1082  Lab Results  Component Value Date   CREATININE 3.99 (H) 03/23/2019   CREATININE 3.22 (H) 03/22/2019   CREATININE 3.02 (H) 03/20/2019     Objective:  Vital signs in last 24 hours:  Temp:  [98.3 F (36.8 C)-98.5 F (36.9 C)] 98.5 F (36.9 C) (01/19 0436) Pulse Rate:  [84-100] 88 (01/19 0436) Resp:  [15-20] 20 (01/19 0436) BP: (75-163)/(36-71) 124/36 (01/19 0436) SpO2:  [99 %-100 %] 100 % (01/19 0436)  Weight change:  Filed Weights   03/16/19 1400 03/18/19 0850 03/18/19 1206  Weight: 86.5 kg 88.4 kg 84.3 kg    Intake/Output:    Intake/Output Summary (Last 24 hours) at 03/24/2019 0941 Last data filed at 03/24/2019 0054 Gross per 24 hour  Intake 510 ml  Output 1082 ml  Net -572 ml     Physical Exam: General:  Chronically ill-appearing, laying in the bed  HEENT  anicteric, moist oral mucous membranes  Pulm/lungs  shallow breathing effort, no crackles  CVS/Heart  irregular rhythm, no rub  Abdomen:   Soft, nontender, PEG tube in place  Extremities:  Bilateral upper extremity 2+ edema  Neurologic:  opens eyes,  Responded briefly  Skin:  Ecchymosis  Access:  PermCath Left IJ       Basic Metabolic Panel:  Recent Labs  Lab 03/18/19 1001 03/18/19 1001 03/19/19 0449 03/19/19 0449 03/20/19 0819 03/22/19 0817 03/23/19 0500  NA 136  --  136  --  136 137 136  K 3.2*  --  4.1  --  4.3 3.9 4.2  CL 98  --  101  --  101 101 100  CO2 29  --  29  --  26 26 26   GLUCOSE 156*  --  154*  --  143* 194* 176*  BUN 24*  --  28*  --  43* 46* 59*  CREATININE 1.75*  --  2.16*  --  3.02* 3.22* 3.99*  CALCIUM 7.9*   < > 7.9*   < > 8.3* 8.6* 8.6*  MG 1.7  --   --   --   --   --    --   PHOS 2.4*  --   --   --   --  3.8 4.5   < > = values in this interval not displayed.     CBC: Recent Labs  Lab 03/18/19 0621 03/18/19 0621 03/18/19 1001 03/18/19 1001 03/19/19 [id number] 03/20/19 0539 03/20/19 1215 03/22/19 0817 03/23/19 1345  WBC 5.7  --  5.6  --  4.6 4.4 5.0  --   --   HGB 8.2*   < > 8.7*   < > 8.7* 7.8* 8.3* 8.4* 8.3*  HCT 25.3*   < > 26.9*   < > 27.1* 24.7* 26.4* 26.0* 26.4*  MCV 95.8  --  95.4  --  96.8 96.9 99.2  --   --   PLT 220  --  203  --  228 235 228  --   --    < > = values in this interval not displayed.      Lab Results  Component Value Date   HEPBSAG NON REACTIVE 03/18/2019  Microbiology:  Recent Results (from the past 240 hour(s))  Wet prep, genital     Status: Abnormal   Collection Time: 03/16/19 10:01 AM  Result Value Ref Range Status   Yeast Wet Prep HPF POC PRESENT (A) NONE SEEN Final   Trich, Wet Prep NONE SEEN NONE SEEN Final   Clue Cells Wet Prep HPF POC NONE SEEN NONE SEEN Final   WBC, Wet Prep HPF POC MANY (A) NONE SEEN Final   Sperm NONE SEEN  Final    Comment: Performed at [facility], [address] [place], [place] [address]  Aerobic Culture (superficial specimen)     Status: None   Collection Time: 03/17/19  5:13 PM   Specimen: Vaginal Fluid  Result Value Ref Range Status   Specimen Description   Final    VAGINA Performed at [facility], [address] [place], [place] [address]    Special Requests   Final    NONE Performed at [facility], [address] [place], [place] [address]    Gram Stain   Final    MODERATE WBC PRESENT,BOTH PMN AND MONONUCLEAR FEW GRAM POSITIVE COCCI IN PAIRS FEW GRAM NEGATIVE RODS Performed at [facility], [place] [address], [place], [place] [address]    Culture   Final    ABUNDANT ESCHERICHIA COLI MODERATE KLEBSIELLA PNEUMONIAE    Report Status 03/21/2019 FINAL  Final   Organism ID, Bacteria ESCHERICHIA COLI  Final   Organism ID,  Bacteria KLEBSIELLA PNEUMONIAE  Final      Susceptibility   Escherichia coli - MIC*    AMPICILLIN 4 SENSITIVE Sensitive     CEFAZOLIN <=4 SENSITIVE Sensitive     CEFEPIME <=0.12 SENSITIVE Sensitive     CEFTAZIDIME <=1 SENSITIVE Sensitive     CEFTRIAXONE <=0.25 SENSITIVE Sensitive     CIPROFLOXACIN <=0.25 SENSITIVE Sensitive     GENTAMICIN <=1 SENSITIVE Sensitive     IMIPENEM <=0.25 SENSITIVE Sensitive     TRIMETH/SULFA <=20 SENSITIVE Sensitive     AMPICILLIN/SULBACTAM <=2 SENSITIVE Sensitive     PIP/TAZO <=4 SENSITIVE Sensitive     * ABUNDANT ESCHERICHIA COLI   Klebsiella pneumoniae - MIC*    AMPICILLIN >=32 RESISTANT Resistant     CEFAZOLIN <=4 SENSITIVE Sensitive     CEFEPIME <=0.12 SENSITIVE Sensitive     CEFTAZIDIME <=1 SENSITIVE Sensitive     CEFTRIAXONE <=0.25 SENSITIVE Sensitive     CIPROFLOXACIN <=0.25 SENSITIVE Sensitive     GENTAMICIN <=1 SENSITIVE Sensitive     IMIPENEM <=0.25 SENSITIVE Sensitive     TRIMETH/SULFA <=20 SENSITIVE Sensitive     AMPICILLIN/SULBACTAM 4 SENSITIVE Sensitive     PIP/TAZO <=4 SENSITIVE Sensitive     * MODERATE KLEBSIELLA PNEUMONIAE  Anaerobic culture     Status: None (Preliminary result)   Collection Time: 03/20/19 12:40 PM   Specimen: PATH Cytology CSF; Cerebrospinal Fluid  Result Value Ref Range Status   Specimen Description   Final    CSF Performed at [facility], [address] [place], [place] [address]    Special Requests   Final    NONE Performed at [facility], [address] [place],  [address]    Gram Stain   Final    WBC PRESENT, PREDOMINANTLY MONONUCLEAR NO ORGANISMS SEEN CYTOSPIN SMEAR Performed at [facility], [place] [place],  [address]    Culture   Final    NO ANAEROBES ISOLATED; CULTURE IN PROGRESS FOR  5 DAYS   Report Status PENDING  Incomplete  CSF culture     Status: None (Preliminary result)   Collection Time: 03/20/19 12:40 PM   Specimen: PATH Cytology CSF;  Cerebrospinal Fluid  Result Value Ref Range Status   Specimen Description   Final    CSF Performed at [facility], [address] [place], [place] [address]    Special Requests   Final    NONE Performed at [facility], [address] [place], [place] [address]    Gram Stain   Final    WBC PRESENT, PREDOMINANTLY MONONUCLEAR NO ORGANISMS SEEN CYTOSPIN SMEAR    Culture   Final    NO GROWTH 3 DAYS Performed at [facility], [place] [address] [place], [place] [address]    Report Status PENDING  Incomplete    Coagulation Studies: No results for input(s): LABPROT, INR in the last 72 hours.  Urinalysis: No results for input(s): COLORURINE, LABSPEC, PHURINE, GLUCOSEU, HGBUR, BILIRUBINUR, KETONESUR, PROTEINUR, UROBILINOGEN, NITRITE, LEUKOCYTESUR in the last 72 hours.  Invalid input(s): APPERANCEUR    Imaging: No results found.   Medications:   . sodium chloride 20 mL (03/23/19 1852)  . feeding supplement (OSMOLITE 1.5 CAL) 1,000 mL (03/23/19 1810)  . vancomycin 750 mg (03/23/19 1630)   . acidophilus  2 capsule Oral BID  . alteplase  2 mg Intracatheter Once  . alteplase  2 mg Intracatheter Once  . apixaban  5 mg Oral BID  . aspirin  81 mg Per Tube Daily  . B-complex with vitamin C  1 tablet Per Tube Daily  . Chlorhexidine Gluconate Cloth  6 each Topical Q0600  . epoetin (EPOGEN/PROCRIT) injection  10,000 Units Intravenous Q M,W,F-HD  . feeding supplement (PRO-STAT SUGAR FREE 64)  30 mL Per Tube Daily  . ferrous gluconate  324 mg Oral Daily  . fluticasone  2 spray Each Nare Daily  . free water  20 mL Per Tube Q4H  . insulin aspart  0-15 Units Subcutaneous Q4H  . levothyroxine  100 mcg Per Tube QAC breakfast  . mouth rinse  15 mL Mouth Rinse BID  . pantoprazole (PROTONIX) IV  40 mg Intravenous Q12H  . psyllium  1 packet Per Tube TID  . zinc sulfate  220 mg Per Tube Daily   sodium chloride, acetaminophen, ipratropium-albuterol, ondansetron  (ZOFRAN) IV, traMADol  Assessment/ Plan:  64 y.o. female with  end-stage renal disease on hemodialysis, atrial fibrillation, chronic diastolic congestive heart failure, coronary artery disease status post CABG, diabetes mellitus type II, hypertension, hyperlipidemia, hypothyroidism, Covid positive in July 2020, recent nursing home stay in [place], Prolonged hospitalization for MRSA bacteremia/sepsis  admitted on 02/09/2019 for ESRD (end stage renal disease) ([place]) [N18.6] Altered mental status, unspecified altered mental status type [R41.82] Chest pain, unspecified type [R07.9] Sepsis, due to unspecified organism, unspecified whether acute organ dysfunction present ([place]) [A41.9]  [facility]   #End-stage renal disease Continue hemodialysis via PermCath on MWF schedule Next HD scheduled for wednesday  #Acute encephalopathy Slow improvement  #MRSA sepsis Right arm AV graft removed this admission on March 04, 2019  #Chronic hypotension We will consider restarting midodrine if BP drops again Also getting IV albumin intermittently during dialysis  #Anemia of chronic kidney disease Epogen scheduled with dialysis   iron tab with Peg tube Lab Results  Component Value Date   HGB 8.3 (L) 03/23/2019      LOS: [place] 1/19/20219:41 AM  [place], [place]  Note: This note was prepared with Dragon dictation. Any transcription errors are unintentional

## 2019-03-24 NOTE — Progress Notes (Signed)
PROGRESS NOTE    Jamie Arias  [id number] DOB: 1955-09-25 DOA: 02/09/2019 PCP: [name], MD   Brief Narrative:  Jamie Arias is a 64 y.o. white female with end-stage renal disease on hemodialysis, atrial fibrillation, chronic diastolic congestive heart failure, coronary artery disease status post CABG, diabetes mellitus type II, hypertension, hyperlipidemia, hypothyroidism, Covid positive in July 2020, recent nursing home stay in [place]. Prolonged hospitalization for MRSA bacteremia/sepsis and now with acute encephalopathy.  Subjective: No acute issues overnight or this morning.  Assessment & Plan:   Principal Problem:   Sepsis due to methicillin resistant Staphylococcus aureus (MRSA) ([place]) Active Problems:   Type 2 diabetes mellitus with ESRD (end-stage renal disease) (HCC)   Atrial fibrillation, chronic (HCC)   Hypotension   Chronic diastolic CHF (congestive heart failure) (HCC)   ESRD (end stage renal disease) (HCC)   Altered level of consciousness   Decubitus ulcer of heel, bilateral, stage 2 (HCC)   MRSA bacteremia   Arm DVT (deep venous thromboembolism), acute, left (HCC)   Acute encephalopathy   Acute metabolic encephalopathy   Acute blood loss anemia   AF (paroxysmal atrial fibrillation) ([place])   Diarrhea   Oropharyngeal dysphagia   Abscess  Sepsis secondary to MRSA bacteremia.  No obvious source.  Right arm graft was removed on 03/04/2019. -Continue with vancomycin for 6-week, will finish her course on 04/01/2019.      Acute metabolic encephalopathy:  mental status remains mostly unchanged -However patient did show more alertness this morning.  She kept her eyes open verbalized a couple of words and try to respond.  Appeared trouble comprehending and are unable to move extremities! -  MRI of brain and EEG negative. Patient remained afebrile with no leukocytosis.  - B12 within normal range  -  She was started on thiamine challenge by neurology  but not much improvement. - Because of persistent encephalopathy - Neuro recommends LP   - she needs to be off Eliquis for 48 hrs and off of heparin - IR  performed LP on 03/20/19 AM: CSF shows only 8 WBC, Protein elevated at 73, Glucose 83. Negative for Toxo. CSF Cx final -- negative. Anaerobic and fungal Cx pending   -CSF/ other la, ID, Neurology assistance  - Repeat TSH was elevated. - Increased dose of Synthroid to 100 mcg -Overall prognosis seems poor.  -Palliative care consulted.  They have been following the patient and in touch with husband.  In the meantime continue medical treatment.  Bleeding from catheter site with multiple ecchymosis.  No new episode over the past 24 to 48 hours -  warfarin was discontinued due to concern for necrosis of skin.  Hemoglobin dropped to 6.8; improved to 8.7 and remains mostly above 8 -Resumed eliquis; so far no new episode of bleeding   Vaginal discharge: - New episode  There was some nursing concern of vaginal discharge  -there is some yeast but Gyn feels this may be colonization - may not need treatment. She is not symptomatic -Since culture grew E. coli and Klebsiella; contamination questionable  - Unasyn course completed on 03/23/19 (4 doses)  Anemia.  Status post transfusion 1 unit of PRBC on 03/17/2019 -  hemoglobin stable above 8.  Iron studies consistent with anemia of chronic illness.  MCV elevated.  -B12 within normal range -Continue EPO with dialysis.  Hypotension.  Resolved now  ESRD (M WF).   Currently using tunneled catheter as graft was removed due to MRSA bacteremia. -  Continue with scheduled dialysis per nephrology  Left arm DVT.  Currently on Eliquis - recent diagnosis of left arm DVT on 03/03/2019. -Discontinue Coumadin due to concern for Coumadin toxicity/side effect due to her skin necrotic lesions.  Hypothyroidism.  Repeat TSH elevated at 17.82. -Increased the dose of Synthroid to 100. - repeat TSH in 2 to 3  weeks.  Paroxysmal A. Fib.. Currently in sinus. Amiodarone was discontinued. -Continue Eliquis  Type 2 diabetes. -Continue SSI.  Chronic diastolic heart failure.  Volume being managed by dialysis.  Bilateral decubitus ulcers.  Present on admission. -Continue with wound care.  Nutrition.  PEG placed 03/03/2019.  Continue tube feeding.  Acute hypoxic respiratory failure.  Resolved.  Patient was intubated initially, extubated on 03/05/2019. -On room air now  Wound care nurse to follow sacral wound; consulted again on 03/24/19   Family communication: Spoke with patient's husband [name] on 03/22/2019.  Answered to all questions.  Updated.  Expressed understanding.  Objective: Vitals:   03/23/19 1645 03/23/19 1700 03/23/19 2047 03/24/19 0436  BP: 112/61 (!) 119/59 (!) 142/48 (!) 124/36  Pulse: 90 88 91 88  Resp: 16 15 16 20   Temp: 98.4 F (36.9 C)  98.5 F (36.9 C) 98.5 F (36.9 C)  TempSrc: Axillary  Oral Oral  SpO2: 99%  99% 100%  Weight:      Height:        Intake/Output Summary (Last 24 hours) at 03/24/2019 1118 Last data filed at 03/24/2019 0054 Gross per 24 hour  Intake 510 ml  Output 1082 ml  Net -572 ml   Filed Weights   03/16/19 1400 03/18/19 0850 03/18/19 1206  Weight: 86.5 kg 88.4 kg 84.3 kg    Examination:  General exam: Chronically ill-appearing lady, minimal response to painful stimuli, in no acute distress. Respiratory system: Clear to auscultation. Respiratory effort normal. Cardiovascular system: S1 & S2 heard, RRR. No JVD, murmurs, rubs, gallops or clicks. Gastrointestinal system: Soft, nontender, nondistended, bowel sounds positive. Central nervous system: Unable to assess as patient is minimally responsive and not following any commands. Extremities: Bilateral upper extremity edema, no cyanosis, pulses intact and symmetrical. Skin: Decubitus ulcers on both heels.  Multiple ecchymosis on all extremities and upper chest, appears healing  now. Psychiatry: Judgement and insight appear impaired.  DVT prophylaxis: Eliquis Code Status: DNR Family Communication: Husband was updated at bedside. Disposition Plan: [facility]ing for bed offer at [facility]  Consultants:   Nephrology  Infectious disease  Vascular surgery  Neurology  Critical care specialist   Procedures:  Antimicrobials:  Vancomycin.  Data Reviewed: I have personally reviewed following labs and imaging studies  CBC: Recent Labs  Lab 03/18/19 0621 03/18/19 0621 03/18/19 1001 03/18/19 1001 03/19/19 0633 03/20/19 0539 03/20/19 1215 03/22/19 0817 03/23/19 1345  WBC 5.7  --  5.6  --  4.6 4.4 5.0  --   --   HGB 8.2*   < > 8.7*   < > 8.7* 7.8* 8.3* 8.4* 8.3*  HCT 25.3*   < > 26.9*   < > 27.1* 24.7* 26.4* 26.0* 26.4*  MCV 95.8  --  95.4  --  96.8 96.9 99.2  --   --   PLT 220  --  203  --  228 235 228  --   --    < > = values in this interval not displayed.   Basic Metabolic Panel: Recent Labs  Lab 03/18/19 1001 03/19/19 0449 03/20/19 0819 03/22/19 0817 03/23/19 0500  NA 136 136  136 137 136  K 3.2* 4.1 4.3 3.9 4.2  CL 98 101 101 101 100  CO2 29 29 26 26 26   GLUCOSE 156* 154* 143* 194* 176*  BUN 24* 28* 43* 46* 59*  CREATININE 1.75* 2.16* 3.02* 3.22* 3.99*  CALCIUM 7.9* 7.9* 8.3* 8.6* 8.6*  MG 1.7  --   --   --   --   PHOS 2.4*  --   --  3.8 4.5   GFR: Estimated Creatinine Clearance: 14.8 mL/min (A) (by C-G formula based on SCr of 3.99 mg/dL (H)). Liver Function Tests: Recent Labs  Lab 03/18/19 1001 03/22/19 0817 03/23/19 0500  ALBUMIN 1.8* 2.0* 2.0*   No results for input(s): LIPASE, AMYLASE in the last 168 hours. No results for input(s): AMMONIA in the last 168 hours. Coagulation Profile: Recent Labs  Lab 03/20/19 0819  INR 1.1   Cardiac Enzymes: Recent Labs  Lab 03/21/19 1909  CKTOTAL 26*   BNP (last 3 results) No results for input(s): PROBNP in the last 8760 hours. HbA1C: No results for input(s): HGBA1C in the  last 72 hours. CBG: Recent Labs  Lab 03/23/19 1223 03/23/19 1805 03/23/19 2045 03/24/19 0047 03/24/19 0804  GLUCAP 198* 102* 157* 124* 74   Lipid Profile: No results for input(s): CHOL, HDL, LDLCALC, TRIG, CHOLHDL, LDLDIRECT in the last 72 hours. Thyroid Function Tests: No results for input(s): TSH, T4TOTAL, FREET4, T3FREE, THYROIDAB in the last 72 hours. Anemia Panel: No results for input(s): VITAMINB12, FOLATE, FERRITIN, TIBC, IRON, RETICCTPCT in the last 72 hours. Sepsis Labs: No results for input(s): PROCALCITON, LATICACIDVEN in the last 168 hours.  Recent Results (from the past 240 hour(s))  Wet prep, genital     Status: Abnormal   Collection Time: 03/16/19 10:01 AM  Result Value Ref Range Status   Yeast Wet Prep HPF POC PRESENT (A) NONE SEEN Final   Trich, Wet Prep NONE SEEN NONE SEEN Final   Clue Cells Wet Prep HPF POC NONE SEEN NONE SEEN Final   WBC, Wet Prep HPF POC MANY (A) NONE SEEN Final   Sperm NONE SEEN  Final    Comment: Performed at [facility], [address] [place], [place] [address]  Aerobic Culture (superficial specimen)     Status: None   Collection Time: 03/17/19  5:13 PM   Specimen: Vaginal Fluid  Result Value Ref Range Status   Specimen Description   Final    VAGINA Performed at [facility], [address], [place], [place] [address]    Special Requests   Final    NONE Performed at [facility], [address] [place], [place] [address]    Gram Stain   Final    MODERATE WBC PRESENT,BOTH PMN AND MONONUCLEAR FEW GRAM POSITIVE COCCI IN PAIRS FEW GRAM NEGATIVE RODS Performed at [facility], [place] [address], [place], [place] [address]    Culture   Final    ABUNDANT ESCHERICHIA COLI MODERATE KLEBSIELLA PNEUMONIAE    Report Status 03/21/2019 FINAL  Final   Organism ID, Bacteria ESCHERICHIA COLI  Final   Organism ID, Bacteria KLEBSIELLA PNEUMONIAE  Final      Susceptibility   Escherichia coli - MIC*     AMPICILLIN 4 SENSITIVE Sensitive     CEFAZOLIN <=4 SENSITIVE Sensitive     CEFEPIME <=0.12 SENSITIVE Sensitive     CEFTAZIDIME <=1 SENSITIVE Sensitive     CEFTRIAXONE <=0.25 SENSITIVE Sensitive     CIPROFLOXACIN <=0.25 SENSITIVE Sensitive  GENTAMICIN <=1 SENSITIVE Sensitive     IMIPENEM <=0.25 SENSITIVE Sensitive     TRIMETH/SULFA <=20 SENSITIVE Sensitive     AMPICILLIN/SULBACTAM <=2 SENSITIVE Sensitive     PIP/TAZO <=4 SENSITIVE Sensitive     * ABUNDANT ESCHERICHIA COLI   Klebsiella pneumoniae - MIC*    AMPICILLIN >=32 RESISTANT Resistant     CEFAZOLIN <=4 SENSITIVE Sensitive     CEFEPIME <=0.12 SENSITIVE Sensitive     CEFTAZIDIME <=1 SENSITIVE Sensitive     CEFTRIAXONE <=0.25 SENSITIVE Sensitive     CIPROFLOXACIN <=0.25 SENSITIVE Sensitive     GENTAMICIN <=1 SENSITIVE Sensitive     IMIPENEM <=0.25 SENSITIVE Sensitive     TRIMETH/SULFA <=20 SENSITIVE Sensitive     AMPICILLIN/SULBACTAM 4 SENSITIVE Sensitive     PIP/TAZO <=4 SENSITIVE Sensitive     * MODERATE KLEBSIELLA PNEUMONIAE  Anaerobic culture     Status: None (Preliminary result)   Collection Time: 03/20/19 12:40 PM   Specimen: PATH Cytology CSF; Cerebrospinal Fluid  Result Value Ref Range Status   Specimen Description   Final    CSF Performed at [facility], [address], [place], [place] [address]    Special Requests   Final    NONE Performed at [facility], [address] [place], [place] [address]    Gram Stain   Final    WBC PRESENT, PREDOMINANTLY MONONUCLEAR NO ORGANISMS SEEN CYTOSPIN SMEAR Performed at [facility], [place] [address], [place], [place] [address]    Culture   Final    NO ANAEROBES ISOLATED; CULTURE IN PROGRESS FOR 5 DAYS   Report Status PENDING  Incomplete  CSF culture     Status: None   Collection Time: 03/20/19 12:40 PM   Specimen: PATH Cytology CSF; Cerebrospinal Fluid  Result Value Ref Range Status   Specimen Description   Final    CSF Performed at  [facility], [address], [place], [place] [address]    Special Requests   Final    NONE Performed at [facility], [address] [place], [place] [address]    Gram Stain   Final    WBC PRESENT, PREDOMINANTLY MONONUCLEAR NO ORGANISMS SEEN CYTOSPIN SMEAR    Culture   Final    NO GROWTH 3 DAYS Performed at [facility], [place] [address], [place], [place] [address]    Report Status 03/24/2019 FINAL  Final     Radiology Studies: No results found.  Scheduled Meds: . acidophilus  2 capsule Oral BID  . alteplase  2 mg Intracatheter Once  . alteplase  2 mg Intracatheter Once  . apixaban  5 mg Oral BID  . aspirin  81 mg Per Tube Daily  . B-complex with vitamin C  1 tablet Per Tube Daily  . Chlorhexidine Gluconate Cloth  6 each Topical Q0600  . epoetin (EPOGEN/PROCRIT) injection  10,000 Units Intravenous Q M,W,F-HD  . feeding supplement (PRO-STAT SUGAR FREE 64)  30 mL Per Tube Daily  . ferrous gluconate  324 mg Oral Daily  . fluticasone  2 spray Each Nare Daily  . free water  20 mL Per Tube Q4H  . insulin aspart  0-15 Units Subcutaneous Q4H  . levothyroxine  100 mcg Per Tube QAC breakfast  . mouth rinse  15 mL Mouth Rinse BID  . pantoprazole (PROTONIX) IV  40 mg Intravenous Q12H  . psyllium  1 packet Per Tube TID  . zinc sulfate  220 mg Per  Tube Daily   Continuous Infusions: . sodium chloride 20 mL (03/23/19 1852)  . feeding supplement (OSMOLITE 1.5 CAL) 1,000 mL (03/23/19 1810)  . vancomycin 750 mg (03/23/19 1630)     LOS: 43 days   Time spent: 35 min.   Thornell Mule, MD [facility] Pager [phone]  If 7PM-7AM, please contact night-coverage [email or web address] Password [facility] 03/24/2019, 11:18 AM   This record has been created using Systems analyst. Errors have been sought and corrected,but may not always be located. Such creation errors do not reflect on the standard of care.

## 2019-03-25 DIAGNOSIS — Z8616 Personal history of COVID-19: Secondary | ICD-10-CM

## 2019-03-25 DIAGNOSIS — R251 Tremor, unspecified: Secondary | ICD-10-CM

## 2019-03-25 DIAGNOSIS — Z8742 Personal history of other diseases of the female genital tract: Secondary | ICD-10-CM

## 2019-03-25 DIAGNOSIS — T827XXD Infection and inflammatory reaction due to other cardiac and vascular devices, implants and grafts, subsequent encounter: Secondary | ICD-10-CM

## 2019-03-25 LAB — RENAL FUNCTION PANEL
Albumin: 1.8 g/dL — ABNORMAL LOW (ref 3.5–5.0)
Anion gap: 10 (ref 5–15)
BUN: 49 mg/dL — ABNORMAL HIGH (ref 8–23)
CO2: 27 mmol/L (ref 22–32)
Calcium: 8.3 mg/dL — ABNORMAL LOW (ref 8.9–10.3)
Chloride: 99 mmol/L (ref 98–111)
Creatinine, Ser: 3.51 mg/dL — ABNORMAL HIGH (ref 0.44–1.00)
GFR calc Af Amer: 15 mL/min — ABNORMAL LOW (ref >60)
GFR calc non Af Amer: 13 mL/min — ABNORMAL LOW (ref >60)
Glucose, Bld: 292 mg/dL — ABNORMAL HIGH (ref 70–99)
Phosphorus: 5.4 mg/dL — ABNORMAL HIGH (ref 2.5–4.6)
Potassium: 4.1 mmol/L (ref 3.5–5.1)
Sodium: 136 mmol/L (ref 135–145)

## 2019-03-25 LAB — GLUCOSE, CAPILLARY
Glucose-Capillary: 161 mg/dL — ABNORMAL HIGH (ref 70–99)
Glucose-Capillary: 169 mg/dL — ABNORMAL HIGH (ref 70–99)
Glucose-Capillary: 190 mg/dL — ABNORMAL HIGH (ref 70–99)
Glucose-Capillary: 220 mg/dL — ABNORMAL HIGH (ref 70–99)
Glucose-Capillary: 239 mg/dL — ABNORMAL HIGH (ref 70–99)

## 2019-03-25 LAB — HEMOGLOBIN AND HEMATOCRIT, BLOOD
HCT: 25.6 % — ABNORMAL LOW (ref 36.0–46.0)
Hemoglobin: 7.9 g/dL — ABNORMAL LOW (ref 12.0–15.0)

## 2019-03-25 LAB — VANCOMYCIN, RANDOM: Vancomycin Rm: 20

## 2019-03-25 NOTE — Progress Notes (Signed)
   03/25/19 1717  Neurological  Level of Consciousness Responds to Voice  Orientation Level Disoriented X4  Respiratory  Respiratory Pattern Regular;Unlabored  Bilateral Breath Sounds Diminished  Cardiac  Pulse Irregular  Heart Sounds S1, S2  stable for d/c no issues vitals stable cvc wdl ufg reached 2L

## 2019-03-25 NOTE — Consult Note (Signed)
WOC Nurse Consult Note: Reason for Consult: sacrum and all four extremities  Wound type: Multiple skin issues related to skin weeping of the UEs. LEs are intact Stage 2 Pressure injury; sacrum/proximal gluteal crease  Pressure Injury POA: Yes Measurement: 2cm x 1.5cm x 0.1cm  Wound [id number] Drainage (amount, consistency, odor) none Periwound: intact  Dressing procedure/placement/frequency: Continue silicone foam to the areas of concern per the skin care order set. Change every 3 days and PRN soilage.   Discussed POC with patient and bedside nurse.  Re consult if needed, will not follow at this time. Thanks  Lief Palmatier [employer], RN,CWOCN, CNS, [place] [phone])

## 2019-03-25 NOTE — Consult Note (Signed)
I have placed a request via Secure Chat to Dr. Lai requesting photos of the wound areas of concern to be placed in the EMR.    Feliza Diven MSN,RN,CWOCN, CNS, CWON-AP [phone] 

## 2019-03-25 NOTE — Progress Notes (Signed)
PROGRESS NOTE    Jamie Arias  [id number] DOB: May 01, 1955 DOA: 02/09/2019 PCP: [name], MD   Brief Narrative:  Jamie Arias is a 64 y.o. white female with end-stage renal disease on hemodialysis, atrial fibrillation, chronic diastolic congestive heart failure, coronary artery disease status post CABG, diabetes mellitus type II, hypertension, hyperlipidemia, hypothyroidism, Covid positive in July 2020, recent nursing home stay in [place]. Prolonged hospitalization for MRSA bacteremia/sepsis and now with acute encephalopathy.  Subjective: No acute issues overnight or this morning.  Pt can not answer ROS questions.  Assessment & Plan:   Principal Problem:   Sepsis due to methicillin resistant Staphylococcus aureus (MRSA) ([place]) Active Problems:   Type 2 diabetes mellitus with ESRD (end-stage renal disease) (HCC)   Atrial fibrillation, chronic (HCC)   Hypotension   Chronic diastolic CHF (congestive heart failure) (HCC)   ESRD (end stage renal disease) (HCC)   Altered level of consciousness   Decubitus ulcer of heel, bilateral, stage 2 (HCC)   MRSA bacteremia   Arm DVT (deep venous thromboembolism), acute, left (HCC)   Acute encephalopathy   Acute metabolic encephalopathy   Acute blood loss anemia   AF (paroxysmal atrial fibrillation) ([place])   Diarrhea   Oropharyngeal dysphagia   Abscess  Sepsis secondary to MRSA bacteremia.  No obvious source.  Right arm graft was removed on 03/04/2019. -Continue with vancomycin for 6-week, will finish her course on 04/01/2019.      Acute metabolic encephalopathy:  mental status remains mostly unchanged -  MRI of brain and EEG negative. Patient remained afebrile with no leukocytosis.  - B12 within normal range  -  She was started on thiamine challenge by neurology but not much improvement. - IR  performed LP on 03/20/19 AM: CSF shows only 8 WBC, Protein elevated at 73, Glucose 83. Negative for Toxo. CSF Cx final -- negative.  Anaerobic and fungal Cx pending   -CSF/ other la, ID, Neurology assistance   - Repeat TSH was elevated. - Increased dose of Synthroid to 100 mcg -Overall prognosis seems poor.  -Palliative care consulted.  They have been following the patient and in touch with husband.  In the meantime continue medical treatment.  Bleeding from catheter site with multiple ecchymosis.  No new episode over the past 24 to 48 hours -  warfarin was discontinued due to concern for necrosis of skin.  Hemoglobin dropped to 6.8; improved to 8.7 and remains mostly above 8 -continue eliquis  Vaginal discharge: - New episode  There was some nursing concern of vaginal discharge  -there is some yeast but Gyn feels this may be colonization - may not need treatment. She is not symptomatic -Since culture grew E. coli and Klebsiella; contamination questionable  - Unasyn course completed on 03/23/19 (4 doses)  Anemia of chronic illness.  Status post transfusion 1 unit of PRBC on 03/17/2019 -  hemoglobin stable above 8.  Iron studies consistent with anemia of chronic illness.  MCV elevated.  -B12 within normal range -Continue EPO with dialysis.  Hypotension.  Resolved now  ESRD (M [facility]F).   Currently using tunneled catheter as graft was removed due to MRSA bacteremia. -Continue with scheduled dialysis per nephrology  Left arm DVT.  Currently on Eliquis - recent diagnosis of left arm DVT on 03/03/2019. -Discontinue Coumadin due to concern for Coumadin toxicity/side effect due to her skin necrotic lesions.  Hypothyroidism.  Repeat TSH elevated at 17.82. -continue Increased the dose of Synthroid 100. - repeat  TSH in 2 to 3 weeks.  Paroxysmal A. Fib.. Currently in sinus. Amiodarone was discontinued. -Continue Eliquis  Type 2 diabetes. -Continue SSI.  Chronic diastolic heart failure.  Volume being managed by dialysis.  Bilateral decubitus ulcers.  Present on admission. -Continue with wound care.  Nutrition.  PEG  placed 03/03/2019.  Continue tube feeding.  Acute hypoxic respiratory failure.  Resolved.  Patient was intubated initially, extubated on 03/05/2019. -On room air now  Wound care nurse to follow sacral wound; consulted again on 03/24/19     DVT prophylaxis: Eliquis Code Status: DNR Family Communication: not today Disposition Plan: Waiting for bed offer at [facility] Also PT re-eval for possible SNF    Objective: Vitals:   03/25/19 1645 03/25/19 1700 03/25/19 1708 03/25/19 1715  BP: (!) 93/50 (!) 95/44  (!) 103/47  Pulse: 87 84 83 84  Resp: 17 16 15 16   Temp:    98.4 F (36.9 C)  TempSrc:      SpO2: 96% 100%  100%  Weight:      Height:        Intake/Output Summary (Last 24 hours) at 03/25/2019 1735 Last data filed at 03/25/2019 1708 Gross per 24 hour  Intake 1040 ml  Output 2475 ml  Net -1435 ml   Filed Weights   03/18/19 0850 03/18/19 1206 03/25/19 1400  Weight: 88.4 kg 84.3 kg 81.7 kg    Examination:  Constitutional: NAD, not interactive, in dialysis HEENT: conjunctivae and lids normal, EOMI CV: RRR no M,R,G. Distal pulses +2.  No cyanosis.   RESP: CTA B/L over anterior, normal respiratory effort  GI: +BS, NTND Extremities: No effusions, edema, or tenderness in BLE SKIN: warm, dry and intact   Consultants:   Nephrology  Infectious disease  Vascular surgery  Neurology  Critical care specialist   Procedures:  Antimicrobials:  Vancomycin.  Data Reviewed: I have personally reviewed following labs and imaging studies  CBC: Recent Labs  Lab 03/19/19 0633 03/19/19 0633 03/20/19 0539 03/20/19 0539 03/20/19 1215 03/22/19 0817 03/23/19 1345 03/24/19 1317 03/25/19 1400  WBC 4.6  --  4.4  --  5.0  --   --   --   --   HGB 8.7*   < > 7.8*   < > 8.3* 8.4* 8.3* 8.4* 7.9*  HCT 27.1*   < > 24.7*   < > 26.4* 26.0* 26.4* 27.0* 25.6*  MCV 96.8  --  96.9  --  99.2  --   --   --   --   PLT 228  --  235  --  228  --   --   --   --    < > = values in  this interval not displayed.   Basic Metabolic Panel: Recent Labs  Lab 03/20/19 0819 03/22/19 0817 03/23/19 0500 03/24/19 1317 03/25/19 1400  NA 136 137 136 138 136  K 4.3 3.9 4.2 3.9 4.1  CL 101 101 100 99 99  CO2 26 26 26 28 27   GLUCOSE 143* 194* 176* 123* 292*  BUN 43* 46* 59* 31* 49*  CREATININE 3.02* 3.22* 3.99* 2.82* 3.51*  CALCIUM 8.3* 8.6* 8.6* 8.6* 8.3*  PHOS  --  3.8 4.5 4.0 5.4*   GFR: Estimated Creatinine Clearance: 16.5 mL/min (A) (by C-G formula based on SCr of 3.51 mg/dL (H)). Liver Function Tests: Recent Labs  Lab 03/22/19 0817 03/23/19 0500 03/24/19 1317 03/25/19 1400  ALBUMIN 2.0* 2.0* 1.9* 1.8*   No results for input(s):  LIPASE, AMYLASE in the last 168 hours. No results for input(s): AMMONIA in the last 168 hours. Coagulation Profile: Recent Labs  Lab 03/20/19 0819  INR 1.1   Cardiac Enzymes: Recent Labs  Lab 03/21/19 1909  CKTOTAL 26*   BNP (last 3 results) No results for input(s): PROBNP in the last 8760 hours. HbA1C: No results for input(s): HGBA1C in the last 72 hours. CBG: Recent Labs  Lab 03/24/19 1646 03/24/19 2059 03/25/19 0049 03/25/19 0418 03/25/19 0747  GLUCAP 118* 143* 161* 169* 190*   Lipid Profile: No results for input(s): CHOL, HDL, LDLCALC, TRIG, CHOLHDL, LDLDIRECT in the last 72 hours. Thyroid Function Tests: No results for input(s): TSH, T4TOTAL, FREET4, T3FREE, THYROIDAB in the last 72 hours. Anemia Panel: No results for input(s): VITAMINB12, FOLATE, FERRITIN, TIBC, IRON, RETICCTPCT in the last 72 hours. Sepsis Labs: No results for input(s): PROCALCITON, LATICACIDVEN in the last 168 hours.  Recent Results (from the past 240 hour(s))  Wet prep, genital     Status: Abnormal   Collection Time: 03/16/19 10:01 AM  Result Value Ref Range Status   Yeast Wet Prep HPF POC PRESENT (A) NONE SEEN Final   Trich, Wet Prep NONE SEEN NONE SEEN Final   Clue Cells Wet Prep HPF POC NONE SEEN NONE SEEN Final   WBC, Wet Prep  HPF POC MANY (A) NONE SEEN Final   Sperm NONE SEEN  Final    Comment: Performed at [facility], [address], [place], [place] [address]  Aerobic Culture (superficial specimen)     Status: None   Collection Time: 03/17/19  5:13 PM   Specimen: Vaginal Fluid  Result Value Ref Range Status   Specimen Description   Final    VAGINA Performed at [facility], [address] [place], [place] [address]    Special Requests   Final    NONE Performed at [facility], [place] [place], [place] [address]    Gram Stain   Final    MODERATE WBC PRESENT,BOTH PMN AND MONONUCLEAR FEW GRAM POSITIVE COCCI IN PAIRS FEW GRAM NEGATIVE RODS Performed at [facility], [place] [address] [place], [place] [address]    Culture   Final    ABUNDANT ESCHERICHIA COLI MODERATE KLEBSIELLA PNEUMONIAE    Report Status 03/21/2019 FINAL  Final   Organism ID, Bacteria ESCHERICHIA COLI  Final   Organism ID, Bacteria KLEBSIELLA PNEUMONIAE  Final      Susceptibility   Escherichia coli - MIC*    AMPICILLIN 4 SENSITIVE Sensitive     CEFAZOLIN <=4 SENSITIVE Sensitive     CEFEPIME <=0.12 SENSITIVE Sensitive     CEFTAZIDIME <=1 SENSITIVE Sensitive     CEFTRIAXONE <=0.25 SENSITIVE Sensitive     CIPROFLOXACIN <=0.25 SENSITIVE Sensitive     GENTAMICIN <=1 SENSITIVE Sensitive     IMIPENEM <=0.25 SENSITIVE Sensitive     TRIMETH/SULFA <=20 SENSITIVE Sensitive     AMPICILLIN/SULBACTAM <=2 SENSITIVE Sensitive     PIP/TAZO <=4 SENSITIVE Sensitive     * ABUNDANT ESCHERICHIA COLI   Klebsiella pneumoniae - MIC*    AMPICILLIN >=32 RESISTANT Resistant     CEFAZOLIN <=4 SENSITIVE Sensitive     CEFEPIME <=0.12 SENSITIVE Sensitive     CEFTAZIDIME <=1 SENSITIVE Sensitive     CEFTRIAXONE <=0.25 SENSITIVE Sensitive     CIPROFLOXACIN <=0.25 SENSITIVE Sensitive     GENTAMICIN <=1 SENSITIVE Sensitive     IMIPENEM <=0.25 SENSITIVE Sensitive     TRIMETH/SULFA <=20  SENSITIVE Sensitive      AMPICILLIN/SULBACTAM 4 SENSITIVE Sensitive     PIP/TAZO <=4 SENSITIVE Sensitive     * MODERATE KLEBSIELLA PNEUMONIAE  Anaerobic culture     Status: None (Preliminary result)   Collection Time: 03/20/19 12:40 PM   Specimen: PATH Cytology CSF; Cerebrospinal Fluid  Result Value Ref Range Status   Specimen Description   Final    CSF Performed at [facility], [address] [place], [place] [address]    Special Requests   Final    NONE Performed at [facility], [address] [place], [place] [address]    Gram Stain   Final    WBC PRESENT, PREDOMINANTLY MONONUCLEAR NO ORGANISMS SEEN CYTOSPIN SMEAR Performed at [facility], [place] [address] [place], [place] [address]    Culture   Final    NO ANAEROBES ISOLATED; CULTURE IN PROGRESS FOR 5 DAYS   Report Status PENDING  Incomplete  CSF culture     Status: None   Collection Time: 03/20/19 12:40 PM   Specimen: PATH Cytology CSF; Cerebrospinal Fluid  Result Value Ref Range Status   Specimen Description   Final    CSF Performed at [facility], [address], [place], [place] [address]    Special Requests   Final    NONE Performed at [facility], [address] [place], [place] [address]    Gram Stain   Final    WBC PRESENT, PREDOMINANTLY MONONUCLEAR NO ORGANISMS SEEN CYTOSPIN SMEAR    Culture   Final    NO GROWTH 3 DAYS Performed at [facility], [place] [address], [place],  [address]    Report Status 03/24/2019 FINAL  Final  Fungus Culture With Stain     Status: None (Preliminary result)   Collection Time: 03/20/19 12:40 PM   Specimen: PATH Cytology CSF; Cerebrospinal Fluid  Result Value Ref Range Status   Fungus Stain Final report  Final    Comment: (NOTE) Performed At: [facility] [address] [place], [place] [id number] Rush Farmer MD [id number]    Fungus (Mycology) Culture PENDING  Incomplete   Fungal Source CSF  Final    Comment:  Performed at [facility], [place] [place],  [address]  Fungus Culture Result     Status: None   Collection Time: 03/20/19 12:40 PM  Result Value Ref Range Status   Result 1 Comment  Final    Comment: (NOTE) KOH/Calcofluor preparation:  no fungus observed. Performed At: [facility] [place], [place] [id number] Rush Farmer MD [id number]      Radiology Studies: No results found.  Scheduled Meds: . acidophilus  2 capsule Oral BID  . alteplase  2 mg Intracatheter Once  . alteplase  2 mg Intracatheter Once  . apixaban  5 mg Oral BID  . aspirin  81 mg Per Tube Daily  . B-complex with vitamin C  1 tablet Per Tube Daily  . Chlorhexidine Gluconate Cloth  6 each Topical Q0600  . epoetin (EPOGEN/PROCRIT) injection  10,000 Units Intravenous Q M,W,F-HD  . feeding supplement (PRO-STAT SUGAR FREE 64)  30 mL Per Tube Daily  . ferrous gluconate  324 mg Oral Daily  . fluticasone  2 spray Each Nare Daily  . free water  20 mL Per Tube Q4H  . insulin aspart  0-15 Units Subcutaneous Q4H  . levothyroxine  100 mcg Per Tube QAC breakfast  . mouth rinse  15 mL Mouth Rinse BID  . pantoprazole (PROTONIX) IV  40 mg Intravenous Q12H  . psyllium  1 packet Per Tube TID  . zinc sulfate  220 mg Per Tube Daily   Continuous Infusions: . sodium chloride 20 mL (03/23/19 1852)  . feeding supplement (OSMOLITE 1.5 CAL) 1,000 mL (03/25/19 0443)  . vancomycin Stopped (03/25/19 1734)     LOS: 66 days    Enzo Bi, MD [facility]   If 7PM-7AM, please contact night-coverage

## 2019-03-25 NOTE — Consult Note (Signed)
Pharmacy Antibiotic Note  Jamie Arias is a 64 y.o. female admitted on 02/09/2019 with methicillin resistant Staphylococcus aureus bacteremia.  Pharmacy has been consulted for vancomycin dosing (previous antibiotics included daptomycin and ceftaroline) She recently underwent AVG removal on 03/04/19.  She will require IV vancomycin until 04/01/19 with goal pre-HD vancomycin levels 20 - 25 mcg/mL based on recommendations by Dr Delaine [name]. There have been no missed doses since the previous note; next dose today with HD  Vancomycin Levels (pre-HD):  03/06/19 0345 24 mcg/mL 03/09/19 0528 20 mcg/mL  03/11/19 0502 20 mcg/mL 03/18/19 0622 20 mcg/mL 03/26/19 1400 20 mcg/mL    Plan:  continue vancomycin 750 mg IV qHD MWF  follow levels once weekly unless otherwise clinically indicated  next vancomycin level would be 04/01/19, however, this will be the last day of therapy. Therefore no further vancomycin level will be ordered except for s/s clinical failure  Height: 5' 4.02" (162.6 cm) Weight: 185 lb 13.6 oz (84.3 kg) IBW/kg (Calculated) : 54.74  Temp (24hrs), Avg:98.4 F (36.9 C), Min:98.1 F (36.7 C), Max:98.7 F (37.1 C)  Recent Labs  Lab 03/19/19 0449 03/19/19 0633 03/20/19 0539 03/20/19 0819 03/20/19 1215 03/22/19 0817 03/23/19 0500 03/24/19 1317  WBC  --  4.6 4.4  --  5.0  --   --   --   CREATININE 2.16*  --   --  3.02*  --  3.22* 3.99* 2.82*    Estimated Creatinine Clearance: 20.9 mL/min (A) (by C-G formula based on SCr of 2.82 mg/dL (H)).    Antimicrobials this admission: ceftaroline 12/14 >> 12/21 daptomycin 12/14 >> 12/26 Vancomycin 12/24 >>  Microbiology results: 12/30 Aerobic/Anaerobic Cx:  NG 12/16 BCx: NG 12/15 WCx MRSA 12/13 BCx: MRSA  12/11 BCx: MRSA  Thank you for allowing pharmacy to be a part of this patient's care.  Vallery Sa, PharmD Clinical Pharmacist 03/25/2019 10:38 AM

## 2019-03-25 NOTE — Progress Notes (Signed)
ID   Pt awake Occasionally verbal  BP (!) 122/41 (BP Location: Right Arm)   Pulse 84   Temp 98.6 F (37 C) (Axillary)   Resp 16   Ht 5' 4.02" (1.626 m)   Wt 81.7 kg   SpO2 98%   BMI 30.90 kg/m    Pt has rigidity upper arms On moving she has tremors Chest b/l air entry Abd soft PEG in place Edema arms Left IJ dialysis cath  CBC Latest Ref Rng & Units 03/25/2019 03/24/2019 03/23/2019  WBC 4.0 - 10.5 K/uL - - -  Hemoglobin 12.0 - 15.0 g/dL 7.9(L) 8.4(L) 8.3(L)  Hematocrit 36.0 - 46.0 % 25.6(L) 27.0(L) 26.4(L)  Platelets 150 - 400 K/uL - - -    CMP Latest Ref Rng & Units 03/25/2019 03/24/2019 03/23/2019  Glucose 70 - 99 mg/dL 292(H) 123(H) 176(H)  BUN 8 - 23 mg/dL 49(H) 31(H) 59(H)  Creatinine 0.44 - 1.00 mg/dL 3.51(H) 2.82(H) 3.99(H)  Sodium 135 - 145 mmol/L 136 138 136  Potassium 3.5 - 5.1 mmol/L 4.1 3.9 4.2  Chloride 98 - 111 mmol/L 99 99 100  CO2 22 - 32 mmol/L 27 28 26   Calcium 8.9 - 10.3 mg/dL 8.3(L) 8.6(L) 8.6(L)  Total Protein 6.5 - 8.1 g/dL - - -  Total Bilirubin 0.3 - 1.2 mg/dL - - -  Alkaline Phos 38 - 126 U/L - - -  AST 15 - 41 U/L - - -  ALT 0 - 44 U/L - - -    Impression/Recommendation MRSA bacteremia with infected AV fistual with stent which have been removed Pt is getting 6 weeks of Anti MRSA treatment form the first neg blood culture Last date 04/01/19   Encephalopathy-improved Pt is more alert - sometimes verbal Rigidity and tremors  Is it one ne parkinsons like disease?? H/o COVID, ESRD, infection all contributing CSF examination unrevealingexcept for 8 wbc and 73 mg protein. Neurologist has ordered encephalopathy panel including NMDAR, LGL1  Greenish vaginal discharge-resolved  culture sent by ob had e.coli and kleb which could very weill be colonization but because of profuse discharge and concern for endometritis or vaginitis- completed treatment with unasyn  Discussed the management with her husband and nurse

## 2019-03-25 NOTE — Progress Notes (Signed)
Jamie Arias, [place] 03/25/19  Subjective:   Hospital day # 44  Doing fair.  No acute events reported by nursing staff.   Patient's opens her eyes, able to answer a few simple questions Denies pain Tube feeds continued at 50 cc/h  Renal: 01/19 0701 - 01/20 0700 In: 1040 [NG/GT:1040] Out: 475 [Stool:475] Lab Results  Component Value Date   CREATININE 2.82 (H) 03/24/2019   CREATININE 3.99 (H) 03/23/2019   CREATININE 3.22 (H) 03/22/2019     Objective:  Vital signs in last 24 hours:  Temp:  [98.1 F (36.7 C)-98.7 F (37.1 C)] 98.5 F (36.9 C) (01/20 0415) Pulse Rate:  [81-87] 85 (01/20 0415) Resp:  [16-20] 19 (01/20 0415) BP: (121-160)/(46-56) 160/56 (01/20 0415) SpO2:  [98 %-99 %] 98 % (01/20 0415)  Weight change:  Filed Weights   03/16/19 1400 03/18/19 0850 03/18/19 1206  Weight: 86.5 kg 88.4 kg 84.3 kg    Intake/Output:    Intake/Output Summary (Last 24 hours) at 03/25/2019 0915 Last data filed at 03/25/2019 0454 Gross per 24 hour  Intake 1040 ml  Output 475 ml  Net 565 ml     Physical Exam: General:  Chronically ill-appearing, laying in the bed  HEENT  anicteric, moist oral mucous membranes  Pulm/lungs  shallow breathing effort, no crackles  CVS/Heart  irregular rhythm, no rub  Abdomen:   Soft, nontender, PEG tube in place  Extremities:  Bilateral upper extremity 2+ edema  Neurologic:  opens eyes,  Responded briefly  Skin:  scattered Ecchymosis  Access:  PermCath Left IJ       Basic Metabolic Panel:  Recent Labs  Lab 03/18/19 1001 03/18/19 1001 03/19/19 0449 03/19/19 0449 03/20/19 0819 03/20/19 0819 03/22/19 0817 03/23/19 0500 03/24/19 1317  NA 136   < > 136  --  136  --  137 136 138  K 3.2*   < > 4.1  --  4.3  --  3.9 4.2 3.9  CL 98   < > 101  --  101  --  101 100 99  CO2 29   < > 29  --  26  --  26 26 28   GLUCOSE 156*   < > 154*  --  143*  --  194* 176* 123*  BUN 24*   < > 28*  --  43*  --  46* 59* 31*   CREATININE 1.75*   < > 2.16*  --  3.02*  --  3.22* 3.99* 2.82*  CALCIUM 7.9*   < > 7.9*   < > 8.3*   < > 8.6* 8.6* 8.6*  MG 1.7  --   --   --   --   --   --   --   --   PHOS 2.4*  --   --   --   --   --  3.8 4.5 4.0   < > = values in this interval not displayed.     CBC: Recent Labs  Lab 03/18/19 1001 03/18/19 1001 03/19/19 [id number] 03/19/19 [id number] 03/20/19 0539 03/20/19 1215 03/22/19 0817 03/23/19 1345 03/24/19 1317  WBC 5.6  --  4.6  --  4.4 5.0  --   --   --   HGB 8.7*   < > 8.7*   < > 7.8* 8.3* 8.4* 8.3* 8.4*  HCT 26.9*   < > 27.1*   < > 24.7* 26.4* 26.0* 26.4* 27.0*  MCV 95.4  --  96.8  --  96.9 99.2  --   --   --   PLT 203  --  228  --  235 228  --   --   --    < > = values in this interval not displayed.      Lab Results  Component Value Date   HEPBSAG NON REACTIVE 03/18/2019      Microbiology:  Recent Results (from the past 240 hour(s))  Wet prep, genital     Status: Abnormal   Collection Time: 03/16/19 10:01 AM  Result Value Ref Range Status   Yeast Wet Prep HPF POC PRESENT (A) NONE SEEN Final   Trich, Wet Prep NONE SEEN NONE SEEN Final   Clue Cells Wet Prep HPF POC NONE SEEN NONE SEEN Final   WBC, Wet Prep HPF POC MANY (A) NONE SEEN Final   Sperm NONE SEEN  Final    Comment: Performed at [facility], [address], [place], [place] [address]  Aerobic Culture (superficial specimen)     Status: None   Collection Time: 03/17/19  5:13 PM   Specimen: Vaginal Fluid  Result Value Ref Range Status   Specimen Description   Final    VAGINA Performed at [facility], [address] [place], [place] [address]    Special Requests   Final    NONE Performed at [facility], [address] [place], [place] [address]    Gram Stain   Final    MODERATE WBC PRESENT,BOTH PMN AND MONONUCLEAR FEW GRAM POSITIVE COCCI IN PAIRS FEW GRAM NEGATIVE RODS Performed at [facility], [place] [address] [place], [place] [address]    Culture    Final    ABUNDANT ESCHERICHIA COLI MODERATE KLEBSIELLA PNEUMONIAE    Report Status 03/21/2019 FINAL  Final   Organism ID, Bacteria ESCHERICHIA COLI  Final   Organism ID, Bacteria KLEBSIELLA PNEUMONIAE  Final      Susceptibility   Escherichia coli - MIC*    AMPICILLIN 4 SENSITIVE Sensitive     CEFAZOLIN <=4 SENSITIVE Sensitive     CEFEPIME <=0.12 SENSITIVE Sensitive     CEFTAZIDIME <=1 SENSITIVE Sensitive     CEFTRIAXONE <=0.25 SENSITIVE Sensitive     CIPROFLOXACIN <=0.25 SENSITIVE Sensitive     GENTAMICIN <=1 SENSITIVE Sensitive     IMIPENEM <=0.25 SENSITIVE Sensitive     TRIMETH/SULFA <=20 SENSITIVE Sensitive     AMPICILLIN/SULBACTAM <=2 SENSITIVE Sensitive     PIP/TAZO <=4 SENSITIVE Sensitive     * ABUNDANT ESCHERICHIA COLI   Klebsiella pneumoniae - MIC*    AMPICILLIN >=32 RESISTANT Resistant     CEFAZOLIN <=4 SENSITIVE Sensitive     CEFEPIME <=0.12 SENSITIVE Sensitive     CEFTAZIDIME <=1 SENSITIVE Sensitive     CEFTRIAXONE <=0.25 SENSITIVE Sensitive     CIPROFLOXACIN <=0.25 SENSITIVE Sensitive     GENTAMICIN <=1 SENSITIVE Sensitive     IMIPENEM <=0.25 SENSITIVE Sensitive     TRIMETH/SULFA <=20 SENSITIVE Sensitive     AMPICILLIN/SULBACTAM 4 SENSITIVE Sensitive     PIP/TAZO <=4 SENSITIVE Sensitive     * MODERATE KLEBSIELLA PNEUMONIAE  Anaerobic culture     Status: None (Preliminary result)   Collection Time: 03/20/19 12:40 PM   Specimen: PATH Cytology CSF; Cerebrospinal Fluid  Result Value Ref Range Status   Specimen Description   Final    CSF Performed at [facility], [address], [place], [place] [address]    Special Requests   Final  NONE Performed at [facility], [address] [place], [place] [address]    Gram Stain   Final    WBC PRESENT, PREDOMINANTLY MONONUCLEAR NO ORGANISMS SEEN CYTOSPIN SMEAR Performed at [facility], [place] [address] [place], [place] [address]    Culture   Final    NO ANAEROBES ISOLATED; CULTURE IN  PROGRESS FOR 5 DAYS   Report Status PENDING  Incomplete  CSF culture     Status: None   Collection Time: 03/20/19 12:40 PM   Specimen: PATH Cytology CSF; Cerebrospinal Fluid  Result Value Ref Range Status   Specimen Description   Final    CSF Performed at [facility], [address] [place], [place] [address]    Special Requests   Final    NONE Performed at [facility], [address] [place], [place] [address]    Gram Stain   Final    WBC PRESENT, PREDOMINANTLY MONONUCLEAR NO ORGANISMS SEEN CYTOSPIN SMEAR    Culture   Final    NO GROWTH 3 DAYS Performed at [facility], [place] [address], [place], [place] [address]    Report Status 03/24/2019 FINAL  Final  Fungus Culture With Stain     Status: None (Preliminary result)   Collection Time: 03/20/19 12:40 PM   Specimen: PATH Cytology CSF; Cerebrospinal Fluid  Result Value Ref Range Status   Fungus Stain Final report  Final    Comment: (NOTE) Performed At: [facility] [address] [place], [place] [id number] Rush Farmer MD [id number]    Fungus (Mycology) Culture PENDING  Incomplete   Fungal Source CSF  Final    Comment: Performed at [facility], [address] [place], [place] [address]  Fungus Culture Result     Status: None   Collection Time: 03/20/19 12:40 PM  Result Value Ref Range Status   Result 1 Comment  Final    Comment: (NOTE) KOH/Calcofluor preparation:  no fungus observed. Performed At: [facility] [place], [place] [id number] Rush Farmer MD [id number]     Coagulation Studies: No results for input(s): LABPROT, INR in the last 72 hours.  Urinalysis: No results for input(s): COLORURINE, LABSPEC, PHURINE, GLUCOSEU, HGBUR, BILIRUBINUR, KETONESUR, PROTEINUR, UROBILINOGEN, NITRITE, LEUKOCYTESUR in the last 72 hours.  Invalid input(s): APPERANCEUR    Imaging: No results found.   Medications:   . sodium chloride 20 mL  (03/23/19 1852)  . feeding supplement (OSMOLITE 1.5 CAL) 1,000 mL (03/25/19 0443)  . vancomycin 750 mg (03/23/19 1630)   . acidophilus  2 capsule Oral BID  . alteplase  2 mg Intracatheter Once  . alteplase  2 mg Intracatheter Once  . apixaban  5 mg Oral BID  . aspirin  81 mg Per Tube Daily  . B-complex with vitamin C  1 tablet Per Tube Daily  . Chlorhexidine Gluconate Cloth  6 each Topical Q0600  . epoetin (EPOGEN/PROCRIT) injection  10,000 Units Intravenous Q M,W,F-HD  . feeding supplement (PRO-STAT SUGAR FREE 64)  30 mL Per Tube Daily  . ferrous gluconate  324 mg Oral Daily  . fluticasone  2 spray Each Nare Daily  . free water  20 mL Per Tube Q4H  . insulin aspart  0-15 Units Subcutaneous Q4H  . levothyroxine  100 mcg Per Tube QAC breakfast  . mouth rinse  15 mL Mouth Rinse BID  . pantoprazole (PROTONIX) IV  40 mg Intravenous Q12H  . psyllium  1 packet Per  Tube TID  . zinc sulfate  220 mg Per Tube Daily   sodium chloride, acetaminophen, ipratropium-albuterol, ondansetron (ZOFRAN) IV, traMADol  Assessment/ Plan:  64 y.o. female with  end-stage renal disease on hemodialysis, atrial fibrillation, chronic diastolic congestive heart failure, coronary artery disease status post CABG, diabetes mellitus type II, hypertension, hyperlipidemia, hypothyroidism, Covid positive in July 2020, recent nursing home stay in [place], Prolonged hospitalization for MRSA bacteremia/sepsis  admitted on 02/09/2019 for ESRD (end stage renal disease) ([place]) [N18.6] Altered mental status, unspecified altered mental status type [R41.82] Chest pain, unspecified type [R07.9] Sepsis, due to unspecified organism, unspecified whether acute organ dysfunction present ([place]) [A41.9]  [facility] M[facility]   #End-stage renal disease Continue hemodialysis via PermCath on MWF schedule Next HD scheduled for Wednesday Fluid removal as tolerated  #Acute encephalopathy Slow improvement Able to answer a few  simple questions  #MRSA sepsis Right arm AV graft removed this admission on March 04, 2019  #Chronic hypotension We will consider restarting midodrine if BP drops again Also getting IV albumin intermittently during dialysis  #Anemia of chronic kidney disease Epogen scheduled with dialysis   iron tab with Peg tube Lab Results  Component Value Date   HGB 8.4 (L) 03/24/2019      LOS: [place] 1/20/20219:15 AM  [place], [place]  Note: This note was prepared with Dragon dictation. Any transcription errors are unintentional

## 2019-03-26 LAB — GLUCOSE, CAPILLARY
Glucose-Capillary: 169 mg/dL — ABNORMAL HIGH (ref 70–99)
Glucose-Capillary: 192 mg/dL — ABNORMAL HIGH (ref 70–99)
Glucose-Capillary: 206 mg/dL — ABNORMAL HIGH (ref 70–99)
Glucose-Capillary: 215 mg/dL — ABNORMAL HIGH (ref 70–99)
Glucose-Capillary: 224 mg/dL — ABNORMAL HIGH (ref 70–99)
Glucose-Capillary: 224 mg/dL — ABNORMAL HIGH (ref 70–99)

## 2019-03-26 LAB — RENAL FUNCTION PANEL
Albumin: 1.9 g/dL — ABNORMAL LOW (ref 3.5–5.0)
Anion gap: 10 (ref 5–15)
BUN: 30 mg/dL — ABNORMAL HIGH (ref 8–23)
CO2: 32 mmol/L (ref 22–32)
Calcium: 8.4 mg/dL — ABNORMAL LOW (ref 8.9–10.3)
Chloride: 100 mmol/L (ref 98–111)
Creatinine, Ser: 2.33 mg/dL — ABNORMAL HIGH (ref 0.44–1.00)
GFR calc Af Amer: 25 mL/min — ABNORMAL LOW (ref >60)
GFR calc non Af Amer: 21 mL/min — ABNORMAL LOW (ref >60)
Glucose, Bld: 207 mg/dL — ABNORMAL HIGH (ref 70–99)
Phosphorus: 3.3 mg/dL (ref 2.5–4.6)
Potassium: 4.1 mmol/L (ref 3.5–5.1)
Sodium: 142 mmol/L (ref 135–145)

## 2019-03-26 LAB — HEMOGLOBIN AND HEMATOCRIT, BLOOD
HCT: 28 % — ABNORMAL LOW (ref 36.0–46.0)
Hemoglobin: 8.5 g/dL — ABNORMAL LOW (ref 12.0–15.0)

## 2019-03-26 MED ORDER — INSULIN GLARGINE 100 UNIT/ML ~~LOC~~ SOLN
5.0000 [IU] | Freq: Every day | SUBCUTANEOUS | Status: DC
  Filled 2019-03-26 (×2): qty 0.05

## 2019-03-26 NOTE — TOC Progression Note (Signed)
Transition of Care [facility]) - Progression Note    Patient Details  Name: Jamie Arias MRN: [id number] Date of Birth: December 28, 1955  Transition of Care [facility]) CM/SW Contact  Shelbie Ammons, RN Phone Number: 03/26/2019, 3:18 PM  Clinical Narrative:   RNCM had discussions with both attending MD as well as nephrologist and including patient's bedside nurse. Due to patient having femoral line removed would it be possible to try patient in sitting trials because if she does become stronger and able to sit up in chair for dialysis this would likely open up more possibilities into skilled nursing placement. MDs in agreement.     Expected Discharge Plan: [place] Barriers to Discharge: Continued Medical Work up  Expected Discharge Plan and Services Expected Discharge Plan: [place] In-house Referral: Clinical Social Work     Living arrangements for the past 2 months: Mobile Home                                       Social Determinants of Health (SDOH) Interventions    Readmission Risk Interventions Readmission Risk Prevention Plan 02/13/2019 09/15/2018  Transportation Screening - Complete  Medication Review Press photographer) Complete Complete  PCP or Specialist appointment within 3-5 days of discharge - Complete  HRI or [place] Complete Complete  SW Recovery Care/Counseling Consult Complete Complete  [place] Not Applicable Complete  Some recent data might be hidden

## 2019-03-26 NOTE — Progress Notes (Signed)
Jamie Arias, [place] 03/26/19  Subjective:   Hospital day # 45  Doing fair.  No acute events reported by nursing staff.   Patient's opens her eyes, able to answer a few simple questions Husband at bedisde Tube feeds continued at 50 cc/h  Renal: 01/20 0701 - 01/21 0700 In: 558 [NG/GT:558] Out: 2000  Lab Results  Component Value Date   CREATININE 2.33 (H) 03/26/2019   CREATININE 3.51 (H) 03/25/2019   CREATININE 2.82 (H) 03/24/2019     Objective:  Vital signs in last 24 hours:  Temp:  [98.3 F (36.8 C)-98.9 F (37.2 C)] 98.9 F (37.2 C) (01/21 1426) Pulse Rate:  [83-92] 90 (01/21 1426) Resp:  [15-16] 16 (01/21 1426) BP: (95-137)/(41-57) 137/57 (01/21 1426) SpO2:  [94 %-100 %] 98 % (01/21 1426)  Weight change:  Filed Weights   03/18/19 0850 03/18/19 1206 03/25/19 1400  Weight: 88.4 kg 84.3 kg 81.7 kg    Intake/Output:    Intake/Output Summary (Last 24 hours) at 03/26/2019 1658 Last data filed at 03/26/2019 0700 Gross per 24 hour  Intake 558 ml  Output 2000 ml  Net -1442 ml     Physical Exam: General:  Chronically ill-appearing, laying in the bed  HEENT  anicteric, moist oral mucous membranes  Pulm/lungs  shallow breathing effort, no crackles  CVS/Heart  irregular rhythm, no rub  Abdomen:   Soft, nontender, PEG tube in place  Extremities:  Bilateral upper extremity 2+ edema  Neurologic:  opens eyes,  Responded briefly  Skin:  scattered Ecchymosis  Access:  PermCath Left IJ       Basic Metabolic Panel:  Recent Labs  Lab 03/22/19 0817 03/22/19 0817 03/23/19 0500 03/23/19 0500 03/24/19 1317 03/25/19 1400 03/26/19 0755  NA 137  --  136  --  138 136 142  K 3.9  --  4.2  --  3.9 4.1 4.1  CL 101  --  100  --  99 99 100  CO2 26  --  26  --  28 27 32  GLUCOSE 194*  --  176*  --  123* 292* 207*  BUN 46*  --  59*  --  31* 49* 30*  CREATININE 3.22*  --  3.99*  --  2.82* 3.51* 2.33*  CALCIUM 8.6*   < > 8.6*   < > 8.6* 8.3*  8.4*  PHOS 3.8  --  4.5  --  4.0 5.4* 3.3   < > = values in this interval not displayed.     CBC: Recent Labs  Lab 03/20/19 0539 03/20/19 0539 03/20/19 1215 03/20/19 1215 03/22/19 0817 03/23/19 1345 03/24/19 1317 03/25/19 1400 03/26/19 0755  WBC 4.4  --  5.0  --   --   --   --   --   --   HGB 7.8*   < > 8.3*   < > 8.4* 8.3* 8.4* 7.9* 8.5*  HCT 24.7*   < > 26.4*   < > 26.0* 26.4* 27.0* 25.6* 28.0*  MCV 96.9  --  99.2  --   --   --   --   --   --   PLT 235  --  228  --   --   --   --   --   --    < > = values in this interval not displayed.      Lab Results  Component Value Date   HEPBSAG NON REACTIVE 03/18/2019  Microbiology:  Recent Results (from the past 240 hour(s))  Aerobic Culture (superficial specimen)     Status: None   Collection Time: 03/17/19  5:13 PM   Specimen: Vaginal Fluid  Result Value Ref Range Status   Specimen Description   Final    VAGINA Performed at [facility], [address], [place], [place] [address]    Special Requests   Final    NONE Performed at [facility], [address] [place], [place] [address]    Gram Stain   Final    MODERATE WBC PRESENT,BOTH PMN AND MONONUCLEAR FEW GRAM POSITIVE COCCI IN PAIRS FEW GRAM NEGATIVE RODS Performed at [facility], [place] [address], [place], [place] [address]    Culture   Final    ABUNDANT ESCHERICHIA COLI MODERATE KLEBSIELLA PNEUMONIAE    Report Status 03/21/2019 FINAL  Final   Organism ID, Bacteria ESCHERICHIA COLI  Final   Organism ID, Bacteria KLEBSIELLA PNEUMONIAE  Final      Susceptibility   Escherichia coli - MIC*    AMPICILLIN 4 SENSITIVE Sensitive     CEFAZOLIN <=4 SENSITIVE Sensitive     CEFEPIME <=0.12 SENSITIVE Sensitive     CEFTAZIDIME <=1 SENSITIVE Sensitive     CEFTRIAXONE <=0.25 SENSITIVE Sensitive     CIPROFLOXACIN <=0.25 SENSITIVE Sensitive     GENTAMICIN <=1 SENSITIVE Sensitive     IMIPENEM <=0.25 SENSITIVE Sensitive      TRIMETH/SULFA <=20 SENSITIVE Sensitive     AMPICILLIN/SULBACTAM <=2 SENSITIVE Sensitive     PIP/TAZO <=4 SENSITIVE Sensitive     * ABUNDANT ESCHERICHIA COLI   Klebsiella pneumoniae - MIC*    AMPICILLIN >=32 RESISTANT Resistant     CEFAZOLIN <=4 SENSITIVE Sensitive     CEFEPIME <=0.12 SENSITIVE Sensitive     CEFTAZIDIME <=1 SENSITIVE Sensitive     CEFTRIAXONE <=0.25 SENSITIVE Sensitive     CIPROFLOXACIN <=0.25 SENSITIVE Sensitive     GENTAMICIN <=1 SENSITIVE Sensitive     IMIPENEM <=0.25 SENSITIVE Sensitive     TRIMETH/SULFA <=20 SENSITIVE Sensitive     AMPICILLIN/SULBACTAM 4 SENSITIVE Sensitive     PIP/TAZO <=4 SENSITIVE Sensitive     * MODERATE KLEBSIELLA PNEUMONIAE  Anaerobic culture     Status: None (Preliminary result)   Collection Time: 03/20/19 12:40 PM   Specimen: PATH Cytology CSF; Cerebrospinal Fluid  Result Value Ref Range Status   Specimen Description   Final    CSF Performed at [facility], [address], [place], [place] [address]    Special Requests   Final    NONE Performed at [facility], [address] [place], [place] [address]    Gram Stain   Final    WBC PRESENT, PREDOMINANTLY MONONUCLEAR NO ORGANISMS SEEN CYTOSPIN SMEAR Performed at [facility], [place]. [address] [place], [place] [address]    Culture   Final    NO ANAEROBES ISOLATED; CULTURE IN PROGRESS FOR 5 DAYS   Report Status PENDING  Incomplete  CSF culture     Status: None   Collection Time: 03/20/19 12:40 PM   Specimen: PATH Cytology CSF; Cerebrospinal Fluid  Result Value Ref Range Status   Specimen Description   Final    CSF Performed at [facility], [address], [place], [place] [address]    Special Requests   Final    NONE Performed at [facility], [address], [place],  [address]    Gram Stain  Final    WBC PRESENT, PREDOMINANTLY MONONUCLEAR NO ORGANISMS SEEN CYTOSPIN SMEAR    Culture   Final    NO GROWTH 3  DAYS Performed at [facility], [place] [address], [place], [place] [address]    Report Status 03/24/2019 FINAL  Final  Fungus Culture With Stain     Status: None (Preliminary result)   Collection Time: 03/20/19 12:40 PM   Specimen: PATH Cytology CSF; Cerebrospinal Fluid  Result Value Ref Range Status   Fungus Stain Final report  Final    Comment: (NOTE) Performed At: [facility] [address] [place], [place] [id number] Rush Farmer MD [id number]    Fungus (Mycology) Culture PENDING  Incomplete   Fungal Source CSF  Final    Comment: Performed at [facility], [address] [place], [place] [address]  Fungus Culture Result     Status: None   Collection Time: 03/20/19 12:40 PM  Result Value Ref Range Status   Result 1 Comment  Final    Comment: (NOTE) KOH/Calcofluor preparation:  no fungus observed. Performed At: [facility] [place], [place] [id number] Rush Farmer MD [id number]     Coagulation Studies: No results for input(s): LABPROT, INR in the last 72 hours.  Urinalysis: No results for input(s): COLORURINE, LABSPEC, PHURINE, GLUCOSEU, HGBUR, BILIRUBINUR, KETONESUR, PROTEINUR, UROBILINOGEN, NITRITE, LEUKOCYTESUR in the last 72 hours.  Invalid input(s): APPERANCEUR    Imaging: No results found.   Medications:   . sodium chloride 20 mL (03/23/19 1852)  . feeding supplement (OSMOLITE 1.5 CAL) 1,000 mL (03/25/19 0443)  . vancomycin Stopped (03/25/19 1734)   . acidophilus  2 capsule Oral BID  . alteplase  2 mg Intracatheter Once  . alteplase  2 mg Intracatheter Once  . apixaban  5 mg Oral BID  . aspirin  81 mg Per Tube Daily  . B-complex with vitamin C  1 tablet Per Tube Daily  . Chlorhexidine Gluconate Cloth  6 each Topical Q0600  . epoetin (EPOGEN/PROCRIT) injection  10,000 Units Intravenous Q M,W,F-HD  . feeding supplement (PRO-STAT SUGAR FREE 64)  30 mL Per Tube Daily  . ferrous gluconate  324 mg Oral  Daily  . fluticasone  2 spray Each Nare Daily  . free water  20 mL Per Tube Q4H  . insulin aspart  0-15 Units Subcutaneous Q4H  . insulin glargine  5 Units Subcutaneous Daily  . levothyroxine  100 mcg Per Tube QAC breakfast  . mouth rinse  15 mL Mouth Rinse BID  . pantoprazole (PROTONIX) IV  40 mg Intravenous Q12H  . psyllium  1 packet Per Tube TID  . zinc sulfate  220 mg Per Tube Daily   sodium chloride, acetaminophen, ipratropium-albuterol, ondansetron (ZOFRAN) IV, traMADol  Assessment/ Plan:  64 y.o. female with  end-stage renal disease on hemodialysis, atrial fibrillation, chronic diastolic congestive heart failure, coronary artery disease status post CABG, diabetes mellitus type II, hypertension, hyperlipidemia, hypothyroidism, Covid positive in July 2020, recent nursing home stay in [place], Prolonged hospitalization for MRSA bacteremia/sepsis  admitted on 02/09/2019 for ESRD (end stage renal disease) ([place]) [N18.6] Altered mental status, unspecified altered mental status type [R41.82] Chest pain, unspecified type [R07.9] Sepsis, due to unspecified organism, unspecified whether acute organ dysfunction present ([place]) [A41.9]  [facility] M[facility]   #End-stage renal disease Continue hemodialysis via PermCath on MWF schedule Next HD scheduled for friday  Fluid removal as tolerated  #Acute encephalopathy Slow improvement Able to answer  a few simple questions  #MRSA sepsis Right arm AV graft removed this admission on March 04, 2019  #Chronic hypotension We will consider restarting midodrine if BP drops again Also getting IV albumin intermittently during dialysis  #Anemia of chronic kidney disease Epogen scheduled with dialysis   iron tab with Peg tube Lab Results  Component Value Date   HGB 8.5 (L) 03/26/2019      LOS: [place] 1/21/20214:58 PM  [place], [place]  Note: This note was prepared with  Dragon dictation. Any transcription errors are unintentional

## 2019-03-26 NOTE — Progress Notes (Signed)
PROGRESS NOTE    Jamie Arias  [id number] DOB: [birth year] DOA: 02/09/2019 PCP: [name], MD   Brief Narrative:  Jamie Arias is a 64 y.o. white female with end-stage renal disease on hemodialysis, atrial fibrillation, chronic diastolic congestive heart failure, coronary artery disease status post CABG, diabetes mellitus type II, hypertension, hyperlipidemia, hypothyroidism, Covid positive in July 2020, recent nursing home stay in [place]. Prolonged hospitalization for MRSA bacteremia/sepsis and now with acute encephalopathy.  Subjective: Sleeping, and not waking up to answer any questions.  Husband at bedside said pt has been sleeping during the day and staying awake at night.  Noted no fever, N/V/D.     Assessment & Plan:   Principal Problem:   Sepsis due to methicillin resistant Staphylococcus aureus (MRSA) ([place]) Active Problems:   Type 2 diabetes mellitus with ESRD (end-stage renal disease) (HCC)   Atrial fibrillation, chronic (HCC)   Hypotension   Chronic diastolic CHF (congestive heart failure) (HCC)   ESRD (end stage renal disease) (HCC)   Altered level of consciousness   Decubitus ulcer of heel, bilateral, stage 2 (HCC)   MRSA bacteremia   Arm DVT (deep venous thromboembolism), acute, left (HCC)   Acute encephalopathy   Acute metabolic encephalopathy   Acute blood loss anemia   AF (paroxysmal atrial fibrillation) ([place])   Diarrhea   Oropharyngeal dysphagia   Abscess  Sepsis secondary to MRSA bacteremia.  No obvious source.  Right arm graft was removed on 03/04/2019. -Continue with vancomycin for 6-week, will finish her course on 04/01/2019.      Acute metabolic encephalopathy:  mental status remains mostly unchanged -  MRI of brain and EEG negative. Patient remained afebrile with no leukocytosis.  - B12 within normal range  -  She was started on thiamine challenge by neurology but not much improvement. - IR  performed LP on 03/20/19 AM: CSF shows  only 8 WBC, Protein elevated at 73, Glucose 83. Negative for Toxo. CSF Cx final -- negative. Anaerobic and fungal Cx pending   -CSF/ other la, ID, Neurology assistance   - Repeat TSH was elevated. - Increased dose of Synthroid to 100 mcg -Overall prognosis seems poor.  -Palliative care consulted.  They have been following the patient and in touch with husband.  In the meantime continue medical treatment.  Bleeding from catheter site with multiple ecchymosis.  No new episode over the past 24 to 48 hours -  warfarin was discontinued due to concern for necrosis of skin.  Hemoglobin dropped to 6.8; improved to 8.7 and remains mostly above 8 -continue eliquis  Vaginal discharge: - New episode  There was some nursing concern of vaginal discharge  -there is some yeast but Gyn feels this may be colonization - may not need treatment. She is not symptomatic -Since culture grew E. coli and Klebsiella; contamination questionable  - Unasyn course completed on 03/23/19 (4 doses)  Anemia of chronic illness.  Status post transfusion 1 unit of PRBC on 03/17/2019 -  hemoglobin stable above 8.  Iron studies consistent with anemia of chronic illness.  MCV elevated.  -B12 within normal range -Continue EPO with dialysis.  Hypotension.  Resolved now  ESRD (M WF).   Currently using tunneled catheter as graft was removed due to MRSA bacteremia. -Continue with scheduled dialysis per nephrology  Left arm DVT.  Currently on Eliquis - recent diagnosis of left arm DVT on 03/03/2019. -Discontinue Coumadin due to concern for Coumadin toxicity/side effect due to her  skin necrotic lesions.  Hypothyroidism.  Repeat TSH elevated at 17.82. -continue Increased the dose of Synthroid 100. - repeat TSH in 2 to 3 weeks.  Paroxysmal A. Fib.. Currently in sinus. Amiodarone was discontinued. -Continue Eliquis  Type 2 diabetes. -Continue SSI.  Chronic diastolic heart failure.  Volume being managed by dialysis.  Bilateral  decubitus ulcers.  Present on admission. -Continue with wound care.  Nutrition.  PEG placed 03/03/2019.  Continue tube feeding.  Acute hypoxic respiratory failure.  Resolved.  Patient was intubated initially, extubated on 03/05/2019. -On room air now  Wound care nurse to follow sacral wound; consulted again on 03/24/19     DVT prophylaxis: Eliquis Code Status: DNR Family Communication: updated husband at bedside Disposition Plan: Waiting for bed offer at [facility] Also PT re-eval for possible SNF    Objective: Vitals:   03/25/19 1740 03/25/19 2059 03/26/19 0547 03/26/19 1426  BP: (!) 122/41 (!) 134/54 (!) 120/44 (!) 137/57  Pulse: 84 87 92 90  Resp: 16 16 16 16   Temp: 98.6 F (37 C) 98.6 F (37 C) 98.3 F (36.8 C) 98.9 F (37.2 C)  TempSrc: Axillary Oral  Axillary  SpO2: 98% 100% 94% 98%  Weight:      Height:        Intake/Output Summary (Last 24 hours) at 03/26/2019 1955 Last data filed at 03/26/2019 0700 Gross per 24 hour  Intake 558 ml  Output --  Net 558 ml   Filed Weights   03/18/19 0850 03/18/19 1206 03/25/19 1400  Weight: 88.4 kg 84.3 kg 81.7 kg    Examination:  Constitutional: NAD, sleeping, not interactive HEENT: conjunctivae and lids normal, EOMI CV: RRR no M,R,G. Distal pulses +2.  No cyanosis.   RESP: CTA B/L over anterior, normal respiratory effort, on RA GI: +BS, NTND Extremities: No effusions, edema, or tenderness in BLE SKIN: warm, dry and intact   Consultants:   Nephrology  Infectious disease  Vascular surgery  Neurology  Critical care specialist   Procedures:  Antimicrobials:  Vancomycin.  Data Reviewed: I have personally reviewed following labs and imaging studies  CBC: Recent Labs  Lab 03/20/19 0539 03/20/19 0539 03/20/19 1215 03/20/19 1215 03/22/19 0817 03/23/19 1345 03/24/19 1317 03/25/19 1400 03/26/19 0755  WBC 4.4  --  5.0  --   --   --   --   --   --   HGB 7.8*   < > 8.3*   < > 8.4* 8.3* 8.4* 7.9*  8.5*  HCT 24.7*   < > 26.4*   < > 26.0* 26.4* 27.0* 25.6* 28.0*  MCV 96.9  --  99.2  --   --   --   --   --   --   PLT 235  --  228  --   --   --   --   --   --    < > = values in this interval not displayed.   Basic Metabolic Panel: Recent Labs  Lab 03/22/19 0817 03/23/19 0500 03/24/19 1317 03/25/19 1400 03/26/19 0755  NA 137 136 138 136 142  K 3.9 4.2 3.9 4.1 4.1  CL 101 100 99 99 100  CO2 26 26 28 27  32  GLUCOSE 194* 176* 123* 292* 207*  BUN 46* 59* 31* 49* 30*  CREATININE 3.22* 3.99* 2.82* 3.51* 2.33*  CALCIUM 8.6* 8.6* 8.6* 8.3* 8.4*  PHOS 3.8 4.5 4.0 5.4* 3.3   GFR: Estimated Creatinine Clearance: 24.9 mL/min (A) (by C-G  formula based on SCr of 2.33 mg/dL (H)). Liver Function Tests: Recent Labs  Lab 03/22/19 0817 03/23/19 0500 03/24/19 1317 03/25/19 1400 03/26/19 0755  ALBUMIN 2.0* 2.0* 1.9* 1.8* 1.9*   No results for input(s): LIPASE, AMYLASE in the last 168 hours. No results for input(s): AMMONIA in the last 168 hours. Coagulation Profile: Recent Labs  Lab 03/20/19 0819  INR 1.1   Cardiac Enzymes: Recent Labs  Lab 03/21/19 1909  CKTOTAL 26*   BNP (last 3 results) No results for input(s): PROBNP in the last 8760 hours. HbA1C: No results for input(s): HGBA1C in the last 72 hours. CBG: Recent Labs  Lab 03/26/19 0003 03/26/19 0421 03/26/19 0742 03/26/19 1216 03/26/19 1605  GLUCAP 206* 215* 169* 192* 224*   Lipid Profile: No results for input(s): CHOL, HDL, LDLCALC, TRIG, CHOLHDL, LDLDIRECT in the last 72 hours. Thyroid Function Tests: No results for input(s): TSH, T4TOTAL, FREET4, T3FREE, THYROIDAB in the last 72 hours. Anemia Panel: No results for input(s): VITAMINB12, FOLATE, FERRITIN, TIBC, IRON, RETICCTPCT in the last 72 hours. Sepsis Labs: No results for input(s): PROCALCITON, LATICACIDVEN in the last 168 hours.  Recent Results (from the past 240 hour(s))  Aerobic Culture (superficial specimen)     Status: None   Collection Time:  03/17/19  5:13 PM   Specimen: Vaginal Fluid  Result Value Ref Range Status   Specimen Description   Final    VAGINA Performed at [facility], [address], [place], [place] [address]    Special Requests   Final    NONE Performed at [facility], [address] [place], [place] [address]    Gram Stain   Final    MODERATE WBC PRESENT,BOTH PMN AND MONONUCLEAR FEW GRAM POSITIVE COCCI IN PAIRS FEW GRAM NEGATIVE RODS Performed at [facility], [place] [address], [place], [place] [address]    Culture   Final    ABUNDANT ESCHERICHIA COLI MODERATE KLEBSIELLA PNEUMONIAE    Report Status 03/21/2019 FINAL  Final   Organism ID, Bacteria ESCHERICHIA COLI  Final   Organism ID, Bacteria KLEBSIELLA PNEUMONIAE  Final      Susceptibility   Escherichia coli - MIC*    AMPICILLIN 4 SENSITIVE Sensitive     CEFAZOLIN <=4 SENSITIVE Sensitive     CEFEPIME <=0.12 SENSITIVE Sensitive     CEFTAZIDIME <=1 SENSITIVE Sensitive     CEFTRIAXONE <=0.25 SENSITIVE Sensitive     CIPROFLOXACIN <=0.25 SENSITIVE Sensitive     GENTAMICIN <=1 SENSITIVE Sensitive     IMIPENEM <=0.25 SENSITIVE Sensitive     TRIMETH/SULFA <=20 SENSITIVE Sensitive     AMPICILLIN/SULBACTAM <=2 SENSITIVE Sensitive     PIP/TAZO <=4 SENSITIVE Sensitive     * ABUNDANT ESCHERICHIA COLI   Klebsiella pneumoniae - MIC*    AMPICILLIN >=32 RESISTANT Resistant     CEFAZOLIN <=4 SENSITIVE Sensitive     CEFEPIME <=0.12 SENSITIVE Sensitive     CEFTAZIDIME <=1 SENSITIVE Sensitive     CEFTRIAXONE <=0.25 SENSITIVE Sensitive     CIPROFLOXACIN <=0.25 SENSITIVE Sensitive     GENTAMICIN <=1 SENSITIVE Sensitive     IMIPENEM <=0.25 SENSITIVE Sensitive     TRIMETH/SULFA <=20 SENSITIVE Sensitive     AMPICILLIN/SULBACTAM 4 SENSITIVE Sensitive     PIP/TAZO <=4 SENSITIVE Sensitive     * MODERATE KLEBSIELLA PNEUMONIAE  Anaerobic culture     Status: None (Preliminary result)   Collection Time: 03/20/19 12:40 PM   Specimen: PATH  Cytology CSF; Cerebrospinal Fluid  Result  Value Ref Range Status   Specimen Description   Final    CSF Performed at [facility], [address] [place], [place] [address]    Special Requests   Final    NONE Performed at [facility], [address] [place], [place] [address]    Gram Stain   Final    WBC PRESENT, PREDOMINANTLY MONONUCLEAR NO ORGANISMS SEEN CYTOSPIN SMEAR Performed at [facility], [place] [address], [place], [place] [address]    Culture   Final    NO ANAEROBES ISOLATED; CULTURE IN PROGRESS FOR 5 DAYS   Report Status PENDING  Incomplete  CSF culture     Status: None   Collection Time: 03/20/19 12:40 PM   Specimen: PATH Cytology CSF; Cerebrospinal Fluid  Result Value Ref Range Status   Specimen Description   Final    CSF Performed at [facility], [address] [place], [place] [address]    Special Requests   Final    NONE Performed at [facility], [address] [place], [place] [address]    Gram Stain   Final    WBC PRESENT, PREDOMINANTLY MONONUCLEAR NO ORGANISMS SEEN CYTOSPIN SMEAR    Culture   Final    NO GROWTH 3 DAYS Performed at [facility], [place] [address], [place], [place] [address]    Report Status 03/24/2019 FINAL  Final  Fungus Culture With Stain     Status: None (Preliminary result)   Collection Time: 03/20/19 12:40 PM   Specimen: PATH Cytology CSF; Cerebrospinal Fluid  Result Value Ref Range Status   Fungus Stain Final report  Final    Comment: (NOTE) Performed At: [facility] [address] [place], [place] [id number] Rush Farmer MD [id number]    Fungus (Mycology) Culture PENDING  Incomplete   Fungal Source CSF  Final    Comment: Performed at [facility], [address] [place], [place] [address]  Fungus Culture Result     Status: None   Collection Time: 03/20/19 12:40 PM  Result Value Ref Range Status   Result 1 Comment  Final    Comment:  (NOTE) KOH/Calcofluor preparation:  no fungus observed. Performed At: [facility] [place], [place] [id number] Rush Farmer MD [id number]      Radiology Studies: No results found.  Scheduled Meds: . acidophilus  2 capsule Oral BID  . alteplase  2 mg Intracatheter Once  . alteplase  2 mg Intracatheter Once  . apixaban  5 mg Oral BID  . aspirin  81 mg Per Tube Daily  . B-complex with vitamin C  1 tablet Per Tube Daily  . Chlorhexidine Gluconate Cloth  6 each Topical Q0600  . epoetin (EPOGEN/PROCRIT) injection  10,000 Units Intravenous Q M,W,F-HD  . feeding supplement (PRO-STAT SUGAR FREE 64)  30 mL Per Tube Daily  . ferrous gluconate  324 mg Oral Daily  . fluticasone  2 spray Each Nare Daily  . free water  20 mL Per Tube Q4H  . insulin aspart  0-15 Units Subcutaneous Q4H  . insulin glargine  5 Units Subcutaneous Daily  . levothyroxine  100 mcg Per Tube QAC breakfast  . mouth rinse  15 mL Mouth Rinse BID  . pantoprazole (PROTONIX) IV  40 mg Intravenous Q12H  . psyllium  1 packet Per Tube TID  . zinc sulfate  220 mg Per Tube Daily   Continuous Infusions: . sodium chloride 20 mL (03/23/19 1852)  .  feeding supplement (OSMOLITE 1.5 CAL) 1,000 mL (03/25/19 0443)  . vancomycin Stopped (03/25/19 1734)     LOS: 8 days    Enzo Bi, MD [facility]   If 7PM-7AM, please contact night-coverage

## 2019-03-26 NOTE — Progress Notes (Signed)
PT Cancellation Note  Patient Details Name: Jamie Arias MRN: [id number] DOB: 16-Feb-1956   Cancelled Treatment:    Reason Eval/Treat Not Completed: Fatigue/lethargy limiting ability to participate;Patient's level of consciousness(Chart reviewed, RN consulted- extensive history collected from Husband. PT has been unable to evaluate patient in past, had signed off on patient in December.) Explained to husband PT had been unable to evaluated d/t to disorientation, AMS, inability to participate- husband reports awareness of this all. This date, he reports patient remains somnolent, has largely been unable to remain awake long enough to have a conversation with him. Will hold PT evaluation this date and attempt again at later date/time once patient is able to participate.   3:12 PM, 03/26/19 Etta Grandchild, PT, DPT Physical Therapist - [facility]  [phone] ([place])    Barton Want C 03/26/2019, 3:12 PM

## 2019-03-27 LAB — GLUCOSE, CAPILLARY
Glucose-Capillary: 158 mg/dL — ABNORMAL HIGH (ref 70–99)
Glucose-Capillary: 191 mg/dL — ABNORMAL HIGH (ref 70–99)
Glucose-Capillary: 200 mg/dL — ABNORMAL HIGH (ref 70–99)
Glucose-Capillary: 203 mg/dL — ABNORMAL HIGH (ref 70–99)
Glucose-Capillary: 209 mg/dL — ABNORMAL HIGH (ref 70–99)
Glucose-Capillary: 259 mg/dL — ABNORMAL HIGH (ref 70–99)
Glucose-Capillary: 263 mg/dL — ABNORMAL HIGH (ref 70–99)

## 2019-03-27 LAB — CRYPTOCOCCAL ANTIGEN: Crypto Ag: NEGATIVE

## 2019-03-27 LAB — ANAEROBIC CULTURE

## 2019-03-27 LAB — RENAL FUNCTION PANEL
Albumin: 1.8 g/dL — ABNORMAL LOW (ref 3.5–5.0)
Anion gap: 8 (ref 5–15)
BUN: 27 mg/dL — ABNORMAL HIGH (ref 8–23)
CO2: 28 mmol/L (ref 22–32)
Calcium: 8.3 mg/dL — ABNORMAL LOW (ref 8.9–10.3)
Chloride: 100 mmol/L (ref 98–111)
Creatinine, Ser: 1.91 mg/dL — ABNORMAL HIGH (ref 0.44–1.00)
GFR calc Af Amer: 31 mL/min — ABNORMAL LOW (ref >60)
GFR calc non Af Amer: 27 mL/min — ABNORMAL LOW (ref >60)
Glucose, Bld: 184 mg/dL — ABNORMAL HIGH (ref 70–99)
Phosphorus: 2.6 mg/dL (ref 2.5–4.6)
Potassium: 5 mmol/L (ref 3.5–5.1)
Sodium: 136 mmol/L (ref 135–145)

## 2019-03-27 MED ORDER — VANCOMYCIN HCL 750 MG/150ML IV SOLN
750.0000 mg | Freq: Once | INTRAVENOUS | Status: AC
  Administered 2019-03-27: 17:00:00 750 mg via INTRAVENOUS
  Filled 2019-03-27: qty 150

## 2019-03-27 MED ORDER — INSULIN GLARGINE 100 UNIT/ML ~~LOC~~ SOLN
10.0000 [IU] | Freq: Every day | SUBCUTANEOUS | Status: DC
  Administered 2019-03-27 – 2019-04-09 (×13): 10 [IU] via SUBCUTANEOUS
  Filled 2019-03-27 (×15): qty 0.1

## 2019-03-27 NOTE — Progress Notes (Signed)
HD Tx started w/o complication    [id number]  Vital Signs  Pulse Rate 98  Pulse Rate Source Monitor  Resp 20  BP (!) 165/61  BP Location Right Leg  BP Method Automatic  Patient Position (if appropriate) Lying  Oxygen Therapy  Pulse Oximetry Type Continuous  During Hemodialysis Assessment  Blood Flow Rate (mL/min) 400 mL/min  Arterial Pressure (mmHg) -180 mmHg  Venous Pressure (mmHg) 150 mmHg  Transmembrane Pressure (mmHg) 60 mmHg  Ultrafiltration Rate (mL/min) 830 mL/min  Dialysate Flow Rate (mL/min) 600 ml/min  Conductivity: Machine  14.2  HD Safety Checks Performed Yes  Dialysis Fluid Bolus Normal Saline  Bolus Amount (mL) 250 mL  Intra-Hemodialysis Comments Tx initiated

## 2019-03-27 NOTE — Progress Notes (Signed)
Post HD Tx    03/27/19 1045  Neurological  Level of Consciousness Alert  Orientation Level Disoriented X4  Respiratory  Respiratory Pattern Regular;Unlabored  Chest Assessment Chest expansion symmetrical  Bilateral Breath Sounds Clear;Diminished  Cough None  Cardiac  Pulse Irregular  Heart Sounds S1, S2  ECG Monitor Yes  Cardiac Rhythm NSR  Vascular  R Radial Pulse +2  L Radial Pulse +2  Edema Generalized  Psychosocial  Psychosocial (WDL) X  Patient Behaviors Not interactive  Needs Expressed Physical  Emotional support given Given to patient

## 2019-03-27 NOTE — Consult Note (Signed)
Pharmacy Antibiotic Note  Jamie Arias is a 64 y.o. female admitted on 02/09/2019 with methicillin resistant Staphylococcus aureus bacteremia.  Pharmacy has been consulted for vancomycin dosing (previous antibiotics included daptomycin and ceftaroline) She recently underwent AVG removal on 03/04/19.  She will require IV vancomycin until 04/01/19 with goal pre-HD vancomycin levels 20 - 25 mcg/mL based on recommendations by Dr Delaine [name]. There have been no missed doses since the previous note; next dose today   Vancomycin Levels (pre-HD):  03/06/19 0345 24 mcg/mL 03/09/19 0528 20 mcg/mL  03/11/19 0502 20 mcg/mL 03/18/19 0622 20 mcg/mL 03/26/19 1400 20 mcg/mL    Plan:  continue vancomycin 750 mg IV qHD MWF  Dose was not administered in HD, therefore is scheduled for later this evening: discussed with RN via secure chat  next vancomycin level would be 04/01/19, however, this will be the last day of therapy. Therefore no further vancomycin levels will be ordered except for s/s clinical failure  Height: 5' 4.02" (162.6 cm) Weight: 181 lb 3.5 oz (82.2 kg) IBW/kg (Calculated) : 54.74  Temp (24hrs), Avg:99 F (37.2 C), Min:98.7 F (37.1 C), Max:99.6 F (37.6 C)  Recent Labs  Lab 03/20/19 0819 03/20/19 1215 03/22/19 0817 03/23/19 0500 03/24/19 1317 03/25/19 1400 03/26/19 0755  WBC  --  5.0  --   --   --   --   --   CREATININE   < >  --  3.22* 3.99* 2.82* 3.51* 2.33*  VANCORANDOM  --   --   --   --   --  20  --    < > = values in this interval not displayed.    Estimated Creatinine Clearance: 25 mL/min (A) (by C-G formula based on SCr of 2.33 mg/dL (H)).    Antimicrobials this admission: ceftaroline 12/14 >> 12/21 daptomycin 12/14 >> 12/26 Vancomycin 12/24 >>1/27  Microbiology results: 12/30 Aerobic/Anaerobic Cx:  NG 12/16 BCx: NG 12/15 WCx MRSA 12/13 BCx: MRSA  12/11 BCx: MRSA  Thank you for allowing pharmacy to be a part of this patient's care.  Vallery Sa,  PharmD Clinical Pharmacist 03/27/2019 8:13 AM

## 2019-03-27 NOTE — Progress Notes (Signed)
PROGRESS NOTE    Jamie Arias  [id number] DOB: 09/08/1955 DOA: 02/09/2019 PCP: [name], MD   Brief Narrative:  Jamie Arias is a 64 y.o. white female with end-stage renal disease on hemodialysis, atrial fibrillation, chronic diastolic congestive heart failure, coronary artery disease status post CABG, diabetes mellitus type II, hypertension, hyperlipidemia, hypothyroidism, Covid positive in July 2020, recent nursing home stay in [place]. Prolonged hospitalization for MRSA bacteremia/sepsis and now with acute encephalopathy.  Subjective: Sleeping, but woke up to my voice.  Husband at bedside said pt again slept all day and remained awake at night.  Noted no fever, N/V.  However, noted rectal tube outputting clear watery fluids.   Assessment & Plan:   Principal Problem:   Sepsis due to methicillin resistant Staphylococcus aureus (MRSA) ([place]) Active Problems:   Type 2 diabetes mellitus with ESRD (end-stage renal disease) (HCC)   Atrial fibrillation, chronic (HCC)   Hypotension   Chronic diastolic CHF (congestive heart failure) (HCC)   ESRD (end stage renal disease) (HCC)   Altered level of consciousness   Decubitus ulcer of heel, bilateral, stage 2 (HCC)   MRSA bacteremia   Arm DVT (deep venous thromboembolism), acute, left (HCC)   Acute encephalopathy   Acute metabolic encephalopathy   Acute blood loss anemia   AF (paroxysmal atrial fibrillation) ([place])   Diarrhea   Oropharyngeal dysphagia   Abscess  Sepsis secondary to MRSA bacteremia.  No obvious source.  Right arm graft was removed on 03/04/2019. -Continue with vancomycin for 6-week, will finish her course on 04/01/2019.      Acute metabolic encephalopathy:  mental status remains mostly unchanged -  MRI of brain and EEG negative. Patient remained afebrile with no leukocytosis.  - B12 within normal range  -  She was started on thiamine challenge by neurology but not much improvement. - IR  performed LP  on 03/20/19 AM: CSF shows only 8 WBC, Protein elevated at 73, Glucose 83. Negative for Toxo. CSF Cx final -- negative. Anaerobic and fungal Cx pending   -CSF/ other la, ID, Neurology assistance   - Repeat TSH was elevated. - Increased dose of Synthroid to 100 mcg -Overall prognosis seems poor.  -Palliative care consulted.  They have been following the patient and in touch with husband.  In the meantime continue medical treatment.  Bleeding from catheter site with multiple ecchymosis.  No new episode over the past 24 to 48 hours -  warfarin was discontinued due to concern for necrosis of skin.  Hemoglobin dropped to 6.8; improved to 8.7 and remains mostly above 8 -continue eliquis  Vaginal discharge: - New episode  There was some nursing concern of vaginal discharge  -there is some yeast but Gyn feels this may be colonization - may not need treatment. She is not symptomatic -Since culture grew E. coli and Klebsiella; contamination questionable  - Unasyn course completed on 03/23/19 (4 doses)  Anemia of chronic illness.  Status post transfusion 1 unit of PRBC on 03/17/2019 -  hemoglobin stable above 8.  Iron studies consistent with anemia of chronic illness.  MCV elevated.  -B12 within normal range -Continue EPO with dialysis.  Hypotension, chronic  consider restarting midodrine if BP drops again Also getting IV albumin intermittently during dialysis  ESRD (M WF).   Currently using tunneled catheter as graft was removed due to MRSA bacteremia. -Continue with scheduled dialysis per nephrology  Left arm DVT.  Currently on Eliquis - recent diagnosis of left  arm DVT on 03/03/2019. -Discontinue Coumadin due to concern for Coumadin toxicity/side effect due to her skin necrotic lesions.  Hypothyroidism.  Repeat TSH elevated at 17.82. -continue Increased the dose of Synthroid 100. - repeat TSH in 2 to 3 weeks.  Paroxysmal A. Fib.. Currently in sinus. Amiodarone was discontinued. -Continue  Eliquis  Type 2 diabetes. -Continue SSI.  Chronic diastolic heart failure.  Volume being managed by dialysis.  Bilateral decubitus ulcers.  Present on admission. -Continue with wound care.  Nutrition.  PEG placed 03/03/2019.  Continue tube feeding.  Acute hypoxic respiratory failure.  Resolved.  Patient was intubated initially, extubated on 03/05/2019. -On room air now  Wound care nurse to follow sacral wound; consulted again on 03/24/19   Watery output per rectal tube --attempted to order C diff but unsuccessful --need to contact ID tomorrow to order C diff   DVT prophylaxis: Eliquis Code Status: DNR Family Communication: updated husband at bedside Disposition Plan: Waiting for bed offer at [facility] Also PT re-eval for possible SNF    Objective: Vitals:   03/27/19 1030 03/27/19 1041 03/27/19 1045 03/27/19 1123  BP: (!) 94/54 98/64 114/68 (!) 113/95  Pulse: 87 86 86 88  Resp:  18 18   Temp:   98.6 F (37 C) 98.8 F (37.1 C)  TempSrc:    Oral  SpO2:  100% 100% 100%  Weight:   81 kg   Height:        Intake/Output Summary (Last 24 hours) at 03/27/2019 1928 Last data filed at 03/27/2019 1045 Gross per 24 hour  Intake 1713 ml  Output 997 ml  Net 716 ml   Filed Weights   03/25/19 1400 03/27/19 0725 03/27/19 1045  Weight: 81.7 kg 82.2 kg 81 kg    Examination:  Constitutional: NAD, sleeping, but woke up, still not answering any questions, staring into space HEENT: conjunctivae and lids normal, EOMI CV: RRR no M,R,G. Distal pulses +2.  No cyanosis.   RESP: CTA B/L over anterior, normal respiratory effort, on RA GI: +BS, NTND, PEG tube in place Extremities: No effusions, edema, or tenderness in BLE SKIN: warm, dry, extensive bruising   Consultants:   Nephrology  Infectious disease  Vascular surgery  Neurology  Critical care specialist   Procedures:  Antimicrobials:  Vancomycin.  Data Reviewed: I have personally reviewed following labs and  imaging studies  CBC: Recent Labs  Lab 03/22/19 0817 03/23/19 1345 03/24/19 1317 03/25/19 1400 03/26/19 0755  HGB 8.4* 8.3* 8.4* 7.9* 8.5*  HCT 26.0* 26.4* 27.0* 25.6* Q000111Q*   Basic Metabolic Panel: Recent Labs  Lab 03/22/19 0817 03/23/19 0500 03/24/19 1317 03/25/19 1400 03/26/19 0755  NA 137 136 138 136 142  K 3.9 4.2 3.9 4.1 4.1  CL 101 100 99 99 100  CO2 26 26 28 27  32  GLUCOSE 194* 176* 123* 292* 207*  BUN 46* 59* 31* 49* 30*  CREATININE 3.22* 3.99* 2.82* 3.51* 2.33*  CALCIUM 8.6* 8.6* 8.6* 8.3* 8.4*  PHOS 3.8 4.5 4.0 5.4* 3.3   GFR: Estimated Creatinine Clearance: 24.8 mL/min (A) (by C-G formula based on SCr of 2.33 mg/dL (H)). Liver Function Tests: Recent Labs  Lab 03/22/19 0817 03/23/19 0500 03/24/19 1317 03/25/19 1400 03/26/19 0755  ALBUMIN 2.0* 2.0* 1.9* 1.8* 1.9*   No results for input(s): LIPASE, AMYLASE in the last 168 hours. No results for input(s): AMMONIA in the last 168 hours. Coagulation Profile: No results for input(s): INR, PROTIME in the last 168 hours. Cardiac Enzymes:  Recent Labs  Lab 03/21/19 1909  CKTOTAL 26*   BNP (last 3 results) No results for input(s): PROBNP in the last 8760 hours. HbA1C: No results for input(s): HGBA1C in the last 72 hours. CBG: Recent Labs  Lab 03/26/19 2012 03/27/19 0017 03/27/19 0352 03/27/19 1121 03/27/19 1607  GLUCAP 224* 259* 200* 203* 209*   Lipid Profile: No results for input(s): CHOL, HDL, LDLCALC, TRIG, CHOLHDL, LDLDIRECT in the last 72 hours. Thyroid Function Tests: No results for input(s): TSH, T4TOTAL, FREET4, T3FREE, THYROIDAB in the last 72 hours. Anemia Panel: No results for input(s): VITAMINB12, FOLATE, FERRITIN, TIBC, IRON, RETICCTPCT in the last 72 hours. Sepsis Labs: No results for input(s): PROCALCITON, LATICACIDVEN in the last 168 hours.  Recent Results (from the past 240 hour(s))  Anaerobic culture     Status: None   Collection Time: 03/20/19 12:40 PM   Specimen: PATH  Cytology CSF; Cerebrospinal Fluid  Result Value Ref Range Status   Specimen Description   Final    CSF Performed at [facility], [address], [place], [place] [address]    Special Requests   Final    NONE Performed at [facility], [address] [place], [place] [address]    Gram Stain   Final    WBC PRESENT, PREDOMINANTLY MONONUCLEAR NO ORGANISMS SEEN CYTOSPIN SMEAR    Culture   Final    NO ANAEROBES ISOLATED Performed at [facility], [place] [address], [place], [place] [address]    Report Status 03/27/2019 FINAL  Final  CSF culture     Status: None   Collection Time: 03/20/19 12:40 PM   Specimen: PATH Cytology CSF; Cerebrospinal Fluid  Result Value Ref Range Status   Specimen Description   Final    CSF Performed at [facility], [address], [place], [place] [address]    Special Requests   Final    NONE Performed at [facility], [address] [place], [place] [address]    Gram Stain   Final    WBC PRESENT, PREDOMINANTLY MONONUCLEAR NO ORGANISMS SEEN CYTOSPIN SMEAR    Culture   Final    NO GROWTH 3 DAYS Performed at [facility], [place] [address] [place], [place] [address]    Report Status 03/24/2019 FINAL  Final  Fungus Culture With Stain     Status: None (Preliminary result)   Collection Time: 03/20/19 12:40 PM   Specimen: PATH Cytology CSF; Cerebrospinal Fluid  Result Value Ref Range Status   Fungus Stain Final report  Final    Comment: (NOTE) Performed At: [facility] [address] [place], [place] [id number] Rush Farmer MD [id number]    Fungus (Mycology) Culture PENDING  Incomplete   Fungal Source CSF  Final    Comment: Performed at [facility], [address] [place], [place] [address]  Fungus Culture Result     Status: None   Collection Time: 03/20/19 12:40 PM  Result Value Ref Range Status   Result 1 Comment  Final    Comment: (NOTE) KOH/Calcofluor preparation:   no fungus observed. Performed At: [facility] [place], [place] [id number] Rush Farmer MD [id number]      Radiology Studies: No results found.  Scheduled Meds: . acidophilus  2 capsule Oral BID  . alteplase  2 mg Intracatheter Once  . alteplase  2 mg Intracatheter Once  . apixaban  5 mg Oral BID  . aspirin  81 mg Per  Tube Daily  . B-complex with vitamin C  1 tablet Per Tube Daily  . Chlorhexidine Gluconate Cloth  6 each Topical Q0600  . epoetin (EPOGEN/PROCRIT) injection  10,000 Units Intravenous Q M,W,F-HD  . feeding supplement (PRO-STAT SUGAR FREE 64)  30 mL Per Tube Daily  . ferrous gluconate  324 mg Oral Daily  . fluticasone  2 spray Each Nare Daily  . free water  20 mL Per Tube Q4H  . insulin aspart  0-15 Units Subcutaneous Q4H  . insulin glargine  10 Units Subcutaneous Daily  . levothyroxine  100 mcg Per Tube QAC breakfast  . mouth rinse  15 mL Mouth Rinse BID  . pantoprazole (PROTONIX) IV  40 mg Intravenous Q12H  . psyllium  1 packet Per Tube TID  . zinc sulfate  220 mg Per Tube Daily   Continuous Infusions: . sodium chloride 20 mL (03/23/19 1852)  . feeding supplement (OSMOLITE 1.5 CAL) 1,000 mL (03/25/19 0443)  . vancomycin Stopped (03/25/19 1734)     LOS: 68 days    Enzo Bi, MD [facility]   If 7PM-7AM, please contact night-coverage

## 2019-03-27 NOTE — Progress Notes (Signed)
PT Cancellation Note  Patient Details Name: Jamie Arias MRN: [id number] DOB: 26-Mar-1955   Cancelled Treatment:    Reason Eval/Treat Not Completed: Fatigue/lethargy limiting ability to participate;Patient's level of consciousness(Chart reviewed, RN consulted. attempted to evaluate. Husband in room. Pt esy to arrouse, but does not interact in a meaninful way. Author takes a few minutes to talk to patient, hold her hand, pt makes eye contact for a few brief moments, but does not follow commands. Will hold evaluation until patient is better able to participate.)  4:51 PM, 03/27/19 Etta Grandchild, PT, DPT Physical Therapist - [facility]  [phone] ([place])    [place] C 03/27/2019, 4:50 PM

## 2019-03-27 NOTE — Progress Notes (Signed)
Pre HD Tx   03/27/19 0725  Vital Signs  Temp 99.6 F (37.6 C)  Temp Source Axillary  Pulse Rate 98  Pulse Rate Source Monitor  Resp 20  BP (!) 167/54  BP Location Right Leg  BP Method Automatic  Patient Position (if appropriate) Lying  Oxygen Therapy  SpO2 98 %  O2 Device Room Air  Pulse Oximetry Type Continuous  Pain Assessment  Pain Scale 0-10  Pain Score 0  Faces Pain Scale 0  Dialysis Weight  Weight 82.2 kg  Type of Weight Pre-Dialysis  Time-Out for Hemodialysis  What Procedure? HD   Pt Identifiers(min of two) First/Last Name;MRN/Account#  Correct Site? Yes  Correct Side? Yes  Correct Procedure? Yes  Consents Verified? Yes  Rad Studies Available? N/A  Safety Precautions Reviewed? Yes  Engineer, civil (consulting) Number [place] Number 2  UF/Alarm Test Passed  Conductivity: Meter 14.2  Conductivity: Machine  14.3  pH 7.2  Reverse Osmosis Main  Normal Saline Lot Number [id number]  Dialyzer Lot Number [id number]  Disposable Set Lot Number [id number]  Machine Temperature 98.6 F (37 C)  Musician and Audible Yes  Blood Lines Intact and Secured Yes  Pre Treatment Patient Checks  Vascular access used during treatment Catheter  HD catheter dressing before treatment WDL  Hepatitis B Surface Antigen Results Negative  Date Hepatitis B Surface Antigen Drawn 03/18/19  Hepatitis B Surface Antibody  (<10)  Date Hepatitis B Surface Antibody Drawn 02/10/19  Hemodialysis Consent Verified Yes  Hemodialysis Standing Orders Initiated Yes  ECG (Telemetry) Monitor On Yes  Prime Ordered Normal Saline  Length of  DialysisTreatment -hour(s) 3 Hour(s)  Dialysis Treatment Comments Na 140  Dialyzer Elisio 17H NR  Dialysate 3K;2.5 Ca  Dialysis Anticoagulant None  Dialysate Flow Ordered 600  Blood Flow Rate Ordered 400 mL/min  Ultrafiltration Goal 2 Liters  Dialysis Blood Pressure Support Ordered Normal Saline  Education / Care Plan  Dialysis Education Provided No  (Comment) (Pt non-interactive )  Hemodialysis Catheter Left Internal jugular Double lumen Permanent (Tunneled)  Placement Date/Time: 02/23/19 1514   Time Out: Correct patient;Correct site;Correct procedure  Maximum sterile barrier precautions: Hand hygiene;Cap;Mask;Sterile gown;Sterile gloves;Large sterile sheet  Site Prep: Chlorhexidine (preferred)  Local Anes...  Site Condition No complications  Blue Lumen Status Blood return noted  Red Lumen Status No blood return (Lines reversed )  Purple Lumen Status N/A  Dressing Type Biopatch;Occlusive  Dressing Status Clean;Dry;Intact

## 2019-03-27 NOTE — Progress Notes (Signed)
HD Tx completed    03/27/19 1041  Vital Signs  Pulse Rate 86  Pulse Rate Source Monitor  Resp 18  BP 98/64  BP Location Right Leg  BP Method Automatic  Patient Position (if appropriate) Lying  Oxygen Therapy  SpO2 100 %  O2 Device Room Air  During Hemodialysis Assessment  HD Safety Checks Performed Yes  KECN 68.3 KECN  Dialysis Fluid Bolus Normal Saline  Bolus Amount (mL) 250 mL  Intra-Hemodialysis Comments Tx completed;Tolerated well

## 2019-03-27 NOTE — Progress Notes (Signed)
Post HD Tx    03/27/19 1045  Hand-Off documentation  Report given to (Full Name) Jodie Smith, RN   Report received from (Full Name) Danilo Javier, RN   Vital Signs  Temp 98.6 F (37 C)  Pulse Rate 86  Pulse Rate Source Monitor  Resp 18  BP 114/68  BP Location Right Leg  BP Method Automatic  Patient Position (if appropriate) Lying  Oxygen Therapy  SpO2 100 %  O2 Device Room Air  Pain Assessment  Pain Scale 0-10  Pain Score 0  Faces Pain Scale 0  Dialysis Weight  Weight 81 kg  Type of Weight Post-Dialysis  Post-Hemodialysis Assessment  Rinseback Volume (mL) 250 mL  KECN 68.3 V  Dialyzer Clearance Lightly streaked  Duration of HD Treatment -hour(s) 3 hour(s)  Hemodialysis Intake (mL) 500 mL  UF Total -Machine (mL) 1247 mL  Net UF (mL) 747 mL  Tolerated HD Treatment Yes  Post-Hemodialysis Comments UF goal not met due to hypotension   Hemodialysis Catheter Left Internal jugular Double lumen Permanent (Tunneled)  Placement Date/Time: 02/23/19 1514   Time Out: Correct patient;Correct site;Correct procedure  Maximum sterile barrier precautions: Hand hygiene;Cap;Mask;Sterile gown;Sterile gloves;Large sterile sheet  Site Prep: Chlorhexidine (preferred)  Local Anes...  Site Condition No complications  Blue Lumen Status Heparin locked  Red Lumen Status Heparin locked  Post treatment catheter status Capped and Clamped   

## 2019-03-27 NOTE — Progress Notes (Signed)
Pre HD Assessment    03/27/19 0725  Neurological  Level of Consciousness Responds to Voice  Orientation Level Disoriented X4  Respiratory  Respiratory Pattern Regular;Unlabored  Chest Assessment Chest expansion symmetrical  Bilateral Breath Sounds Diminished  Cough None  Cardiac  Pulse Irregular  Heart Sounds S1, S2  ECG Monitor Yes  Cardiac Rhythm NSR  Vascular  R Radial Pulse +2  L Radial Pulse +2  Edema Generalized  Psychosocial  Psychosocial (WDL) X  Patient Behaviors Not interactive  Needs Expressed Physical  Emotional support given Given to patient

## 2019-03-27 NOTE — Progress Notes (Signed)
[place], [place] 03/27/19  Subjective:   Hospital day # 46  Doing fair.  No acute events reported by nursing staff.   Non verbal today Tube feeds continued at 50 cc/h  Renal: 01/21 0701 - 01/22 0700 In: 1713 [NG/GT:207] Out: 250 [Stool:250]   HEMODIALYSIS FLOWSHEET:  Blood Flow Rate (mL/min): 400 mL/min Arterial Pressure (mmHg): -180 mmHg Venous Pressure (mmHg): 140 mmHg Transmembrane Pressure (mmHg): 60 mmHg Ultrafiltration Rate (mL/min): 380 mL/min Dialysate Flow Rate (mL/min): 600 ml/min Conductivity: Machine : 14.1 Conductivity: Machine : 14.1 Dialysis Fluid Bolus: Normal Saline Bolus Amount (mL): 250 mL Dialysate Change: (3K)     Objective:  Vital signs in last 24 hours:  Temp:  [98.6 F (37 C)-99.6 F (37.6 C)] 98.8 F (37.1 C) (01/22 1123) Pulse Rate:  [86-98] 88 (01/22 1123) Resp:  [16-20] 18 (01/22 1045) BP: (89-167)/(43-95) 113/95 (01/22 1123) SpO2:  [96 %-100 %] 100 % (01/22 1123) Weight:  [81 kg-82.2 kg] 81 kg (01/22 1045)  Weight change:  Filed Weights   03/25/19 1400 03/27/19 0725 03/27/19 1045  Weight: 81.7 kg 82.2 kg 81 kg    Intake/Output:    Intake/Output Summary (Last 24 hours) at 03/27/2019 1351 Last data filed at 03/27/2019 1045 Gross per 24 hour  Intake 1713 ml  Output 997 ml  Net 716 ml     Physical Exam: General:  Chronically ill-appearing, laying in the bed  HEENT  anicteric, moist oral mucous membranes  Pulm/lungs  shallow breathing effort, no crackles  CVS/Heart  irregular rhythm, no rub  Abdomen:   Soft, nontender, PEG tube in place  Extremities:  Bilateral upper extremity 2+ edema  Neurologic:  lethargic today  Skin:  scattered Ecchymosis  Access:  PermCath Left IJ       Basic Metabolic Panel:  Recent Labs  Lab 03/22/19 0817 03/22/19 0817 03/23/19 0500 03/23/19 0500 03/24/19 1317 03/25/19 1400 03/26/19 0755  NA 137  --  136  --  138 136 142  K 3.9  --  4.2  --  3.9 4.1  4.1  CL 101  --  100  --  99 99 100  CO2 26  --  26  --  28 27 32  GLUCOSE 194*  --  176*  --  123* 292* 207*  BUN 46*  --  59*  --  31* 49* 30*  CREATININE 3.22*  --  3.99*  --  2.82* 3.51* 2.33*  CALCIUM 8.6*   < > 8.6*   < > 8.6* 8.3* 8.4*  PHOS 3.8  --  4.5  --  4.0 5.4* 3.3   < > = values in this interval not displayed.     CBC: Recent Labs  Lab 03/22/19 0817 03/23/19 1345 03/24/19 1317 03/25/19 1400 03/26/19 0755  HGB 8.4* 8.3* 8.4* 7.9* 8.5*  HCT 26.0* 26.4* 27.0* 25.6* 28.0*      Lab Results  Component Value Date   HEPBSAG NON REACTIVE 03/18/2019      Microbiology:  Recent Results (from the past 240 hour(s))  Aerobic Culture (superficial specimen)     Status: None   Collection Time: 03/17/19  5:13 PM   Specimen: Vaginal Fluid  Result Value Ref [place] Status   Specimen Description   Final    VAGINA Performed at [facility], [address] [place], [place] [address]    Special Requests   Final    NONE Performed at [facility], [place],  [place] [address]    Gram Stain   Final    MODERATE WBC PRESENT,BOTH PMN AND MONONUCLEAR FEW GRAM POSITIVE COCCI IN PAIRS FEW GRAM NEGATIVE RODS Performed at [facility], [place] [address], [place], [place] [address]    Culture   Final    ABUNDANT ESCHERICHIA COLI MODERATE KLEBSIELLA PNEUMONIAE    Report Status 03/21/2019 FINAL  Final   Organism ID, Bacteria ESCHERICHIA COLI  Final   Organism ID, Bacteria KLEBSIELLA PNEUMONIAE  Final      Susceptibility   Escherichia coli - MIC*    AMPICILLIN 4 SENSITIVE Sensitive     CEFAZOLIN <=4 SENSITIVE Sensitive     CEFEPIME <=0.12 SENSITIVE Sensitive     CEFTAZIDIME <=1 SENSITIVE Sensitive     CEFTRIAXONE <=0.25 SENSITIVE Sensitive     CIPROFLOXACIN <=0.25 SENSITIVE Sensitive     GENTAMICIN <=1 SENSITIVE Sensitive     IMIPENEM <=0.25 SENSITIVE Sensitive     TRIMETH/SULFA <=20 SENSITIVE Sensitive     AMPICILLIN/SULBACTAM <=2  SENSITIVE Sensitive     PIP/TAZO <=4 SENSITIVE Sensitive     * ABUNDANT ESCHERICHIA COLI   Klebsiella pneumoniae - MIC*    AMPICILLIN >=32 RESISTANT Resistant     CEFAZOLIN <=4 SENSITIVE Sensitive     CEFEPIME <=0.12 SENSITIVE Sensitive     CEFTAZIDIME <=1 SENSITIVE Sensitive     CEFTRIAXONE <=0.25 SENSITIVE Sensitive     CIPROFLOXACIN <=0.25 SENSITIVE Sensitive     GENTAMICIN <=1 SENSITIVE Sensitive     IMIPENEM <=0.25 SENSITIVE Sensitive     TRIMETH/SULFA <=20 SENSITIVE Sensitive     AMPICILLIN/SULBACTAM 4 SENSITIVE Sensitive     PIP/TAZO <=4 SENSITIVE Sensitive     * MODERATE KLEBSIELLA PNEUMONIAE  Anaerobic culture     Status: None   Collection Time: 03/20/19 12:40 PM   Specimen: PATH Cytology CSF; Cerebrospinal Fluid  Result Value Ref [place] Status   Specimen Description   Final    CSF Performed at [facility], [address] [place], [place] [address]    Special Requests   Final    NONE Performed at [facility], [address] [place], [place] [address]    Gram Stain   Final    WBC PRESENT, PREDOMINANTLY MONONUCLEAR NO ORGANISMS SEEN CYTOSPIN SMEAR    Culture   Final    NO ANAEROBES ISOLATED Performed at [facility], [address], [place], [place] [address]    Report Status 03/27/2019 FINAL  Final  CSF culture     Status: None   Collection Time: 03/20/19 12:40 PM   Specimen: PATH Cytology CSF; Cerebrospinal Fluid  Result Value Ref [place] Status   Specimen Description   Final    CSF Performed at [facility], [address], [place], [place] [address]    Special Requests   Final    NONE Performed at [facility], [address] [place], [place] [address]    Gram Stain   Final    WBC PRESENT, PREDOMINANTLY MONONUCLEAR NO ORGANISMS SEEN CYTOSPIN SMEAR    Culture   Final    NO GROWTH 3 DAYS Performed at [facility], [place] [address] [place], [place] [address]    Report Status 03/24/2019 FINAL  Final   Fungus Culture With Stain     Status: None (Preliminary result)   Collection Time: 03/20/19 12:40 PM   Specimen: PATH Cytology CSF; Cerebrospinal Fluid  Result Value Ref [place] Status   Fungus Stain Final report  Final    Comment: (NOTE) Performed At: [name] [place], [place] [id number] Rush Farmer MD [id number]    Fungus (Mycology) Culture PENDING  Incomplete   Fungal Source CSF  Final    Comment: Performed at [facility], [address] [place], [place] [address]  Fungus Culture Result     Status: None   Collection Time: 03/20/19 12:40 PM  Result Value Ref [place] Status   Result 1 Comment  Final    Comment: (NOTE) KOH/Calcofluor preparation:  no fungus observed. Performed At: [facility] [place], [place] [id number] Rush Farmer MD [id number]     Coagulation Studies: No results for input(s): LABPROT, INR in the last 72 hours.  Urinalysis: No results for input(s): COLORURINE, LABSPEC, PHURINE, GLUCOSEU, HGBUR, BILIRUBINUR, KETONESUR, PROTEINUR, UROBILINOGEN, NITRITE, LEUKOCYTESUR in the last 72 hours.  Invalid input(s): APPERANCEUR    Imaging: No results found.   Medications:   . sodium chloride 20 mL (03/23/19 1852)  . feeding supplement (OSMOLITE 1.5 CAL) 1,000 mL (03/25/19 0443)  . vancomycin Stopped (03/25/19 1734)  . vancomycin     . acidophilus  2 capsule Oral BID  . alteplase  2 mg Intracatheter Once  . alteplase  2 mg Intracatheter Once  . apixaban  5 mg Oral BID  . aspirin  81 mg Per Tube Daily  . B-complex with vitamin C  1 tablet Per Tube Daily  . Chlorhexidine Gluconate Cloth  6 each Topical Q0600  . epoetin (EPOGEN/PROCRIT) injection  10,000 Units Intravenous Q M,W,F-HD  . feeding supplement (PRO-STAT SUGAR FREE 64)  30 mL Per Tube Daily  . ferrous gluconate  324 mg Oral Daily  . fluticasone  2 spray Each Nare Daily  . free water  20 mL Per Tube Q4H  . insulin aspart  0-15  Units Subcutaneous Q4H  . insulin glargine  10 Units Subcutaneous Daily  . levothyroxine  100 mcg Per Tube QAC breakfast  . mouth rinse  15 mL Mouth Rinse BID  . pantoprazole (PROTONIX) IV  40 mg Intravenous Q12H  . psyllium  1 packet Per Tube TID  . zinc sulfate  220 mg Per Tube Daily   sodium chloride, acetaminophen, ipratropium-albuterol, ondansetron (ZOFRAN) IV, traMADol  Assessment/ Plan:  64 y.o. female with  end-stage renal disease on hemodialysis, atrial fibrillation, chronic diastolic congestive heart failure, coronary artery disease status post CABG, diabetes mellitus type II, hypertension, hyperlipidemia, hypothyroidism, Covid positive in July 2020, recent nursing home stay in [place], Prolonged hospitalization for MRSA bacteremia/sepsis  admitted on 02/09/2019 for ESRD (end stage renal disease) ([place]) [N18.6] Altered mental status, unspecified altered mental status type [R41.82] Chest pain, unspecified type [R07.9] Sepsis, due to unspecified organism, unspecified whether acute organ dysfunction present ([place]) [A41.9]  [facility] M[facility]   #End-stage renal disease Continue hemodialysis via PermCath on MWF schedule Seen during HD today Fluid removal as tolerated  #Acute encephalopathy Slow improvement Able to answer a few simple questions intermittently over last few days  #MRSA sepsis Right arm AV graft removed this admission on March 04, 2019  #Chronic hypotension We will consider restarting midodrine if BP drops again Also getting IV albumin intermittently during dialysis  #Anemia of chronic kidney disease Epogen scheduled with dialysis   iron tab with Peg tube Lab Results  Component Value Date   HGB 8.5 (L) 03/26/2019      LOS: [place] 1/22/20211:51 PM  [place],  [place] [phone]  Note: This note was prepared with Dragon dictation. Any transcription errors are unintentional

## 2019-03-28 LAB — RENAL FUNCTION PANEL
Albumin: 1.8 g/dL — ABNORMAL LOW (ref 3.5–5.0)
Anion gap: 9 (ref 5–15)
BUN: 36 mg/dL — ABNORMAL HIGH (ref 8–23)
CO2: 29 mmol/L (ref 22–32)
Calcium: 8.7 mg/dL — ABNORMAL LOW (ref 8.9–10.3)
Chloride: 100 mmol/L (ref 98–111)
Creatinine, Ser: 2.15 mg/dL — ABNORMAL HIGH (ref 0.44–1.00)
GFR calc Af Amer: 27 mL/min — ABNORMAL LOW (ref >60)
GFR calc non Af Amer: 23 mL/min — ABNORMAL LOW (ref >60)
Glucose, Bld: 180 mg/dL — ABNORMAL HIGH (ref 70–99)
Phosphorus: 3 mg/dL (ref 2.5–4.6)
Potassium: 4.4 mmol/L (ref 3.5–5.1)
Sodium: 138 mmol/L (ref 135–145)

## 2019-03-28 LAB — GLUCOSE, CAPILLARY
Glucose-Capillary: 184 mg/dL — ABNORMAL HIGH (ref 70–99)
Glucose-Capillary: 211 mg/dL — ABNORMAL HIGH (ref 70–99)
Glucose-Capillary: 189 mg/dL — ABNORMAL HIGH (ref 70–99)
Glucose-Capillary: 146 mg/dL — ABNORMAL HIGH (ref 70–99)
Glucose-Capillary: 185 mg/dL — ABNORMAL HIGH (ref 70–99)

## 2019-03-28 LAB — CBC
HCT: 26.1 % — ABNORMAL LOW (ref 36.0–46.0)
Hemoglobin: 7.9 g/dL — ABNORMAL LOW (ref 12.0–15.0)
MCH: 31.1 pg (ref 26.0–34.0)
MCHC: 30.3 g/dL (ref 30.0–36.0)
MCV: 102.8 fL — ABNORMAL HIGH (ref 80.0–100.0)
Platelets: 282 10*3/uL (ref 150–400)
RBC: 2.54 MIL/uL — ABNORMAL LOW (ref 3.87–5.11)
RDW: 18.7 % — ABNORMAL HIGH (ref 11.5–15.5)
WBC: 6.4 10*3/uL (ref 4.0–10.5)
nRBC: 0.6 % — ABNORMAL HIGH (ref 0.0–0.2)

## 2019-03-28 NOTE — Progress Notes (Signed)
PROGRESS NOTE    Jamie Arias  [id number] DOB: 12-12-1955 DOA: 02/09/2019 PCP: [name], MD   Brief Narrative:  Jamie Arias is a 64 y.o. white female with end-stage renal disease on hemodialysis, atrial fibrillation, chronic diastolic congestive heart failure, coronary artery disease status post CABG, diabetes mellitus type II, hypertension, hyperlipidemia, hypothyroidism, Covid positive in July 2020, recent nursing home stay in [place]. Prolonged hospitalization for MRSA bacteremia/sepsis and now with acute encephalopathy.  Subjective: Sleeping, but woke up to my voice.  Pt was noted to have said a few words today.  Husband said pt complained of abdominal pain.  Pt didn't respond to any of my questions.   Assessment & Plan:   Principal Problem:   Sepsis due to methicillin resistant Staphylococcus aureus (MRSA) ([place]) Active Problems:   Type 2 diabetes mellitus with ESRD (end-stage renal disease) (HCC)   Atrial fibrillation, chronic (HCC)   Hypotension   Chronic diastolic CHF (congestive heart failure) (HCC)   ESRD (end stage renal disease) (HCC)   Altered level of consciousness   Decubitus ulcer of heel, bilateral, stage 2 (HCC)   MRSA bacteremia   Arm DVT (deep venous thromboembolism), acute, left (HCC)   Acute encephalopathy   Acute metabolic encephalopathy   Acute blood loss anemia   AF (paroxysmal atrial fibrillation) ([place])   Diarrhea   Oropharyngeal dysphagia   Abscess  Sepsis secondary to MRSA bacteremia.  No obvious source.  Right arm graft was removed on 03/04/2019. -Continue with vancomycin for 6-week, will finish her course on 04/01/2019.      Acute metabolic encephalopathy:  mental status remains mostly unchanged -  MRI of brain and EEG negative. Patient remained afebrile with no leukocytosis.  - B12 within normal range  -  She was started on thiamine challenge by neurology but not much improvement. - IR  performed LP on 03/20/19 AM: CSF  shows only 8 WBC, Protein elevated at 73, Glucose 83. Negative for Toxo. CSF Cx final -- negative. Anaerobic and fungal Cx pending   -CSF/ other la, ID, Neurology assistance   - Repeat TSH was elevated. - Increased dose of Synthroid to 100 mcg -Overall prognosis seems poor.  -Palliative care consulted.  They have been following the patient and in touch with husband.  In the meantime continue medical treatment.  Bleeding from catheter site with multiple ecchymosis.  No new episode over the past 24 to 48 hours -  warfarin was discontinued due to concern for necrosis of skin.  Hemoglobin dropped to 6.8; improved to 8.7 and remains mostly above 8 -continue eliquis  Vaginal discharge, resolved There was some nursing concern of vaginal discharge  -there is some yeast but Gyn feels this may be colonization - may not need treatment. She is not symptomatic -Since culture grew E. coli and Klebsiella; contamination questionable  - Unasyn course completed on 03/23/19 (4 doses)  Anemia of chronic illness.  Status post transfusion 1 unit of PRBC on 03/17/2019 -  hemoglobin stable above 8.  Iron studies consistent with anemia of chronic illness.  MCV elevated.  -B12 within normal range -Continue EPO with dialysis.  Hypotension, chronic  consider restarting midodrine if BP drops again Also getting IV albumin intermittently during dialysis  ESRD (MWF).   Currently using tunneled catheter as graft was removed due to MRSA bacteremia. -Continue with scheduled dialysis per nephrology  Left arm DVT.  Currently on Eliquis - recent diagnosis of left arm DVT on 03/03/2019. -Discontinue  Coumadin due to concern for Coumadin toxicity/side effect due to her skin necrotic lesions.  Hypothyroidism.  Repeat TSH elevated at 17.82. -continue Increased the dose of Synthroid 100. - repeat TSH in 2 to 3 weeks.  Paroxysmal A. Fib.. Currently in sinus. Amiodarone was discontinued. -Continue Eliquis  Type 2  diabetes. -Continue SSI.  Chronic diastolic heart failure.  Volume being managed by dialysis.  Bilateral decubitus ulcers.  Present on admission. -Continue with wound care.  Nutrition.  PEG placed 03/03/2019.  Continue tube feeding.  Acute hypoxic respiratory failure.  Resolved.  Patient was intubated initially, extubated on 03/05/2019. -On room air now  Wound care nurse to follow sacral wound; consulted again on 03/24/19   Watery output per rectal tube --attempted to order C diff but unsuccessful --need to contact ID on Monday to order C diff   DVT prophylaxis: Eliquis Code Status: DNR Family Communication: updated husband at bedside Disposition Plan: [facility] Consider SNF , though pt currently not able to participate in PT   Objective: Vitals:   03/27/19 1123 03/27/19 1934 03/28/19 0352 03/28/19 1145  BP: (!) 113/95 (!) 136/45 (!) 141/47 (!) 136/56  Pulse: 88 83 79 78  Resp:  20 20 18   Temp: 98.8 F (37.1 C) 98.5 F (36.9 C) 98.6 F (37 C) 98.7 F (37.1 C)  TempSrc: Oral Oral Oral Oral  SpO2: 100% 99% 99% 98%  Weight:      Height:        Intake/Output Summary (Last 24 hours) at 03/28/2019 1929 Last data filed at 03/28/2019 1700 Gross per 24 hour  Intake 3234 ml  Output --  Net 3234 ml   Filed Weights   03/25/19 1400 03/27/19 0725 03/27/19 1045  Weight: 81.7 kg 82.2 kg 81 kg    Examination:  Constitutional: NAD, sleeping, but woke up, still not answering any questions, staring into space HEENT: conjunctivae and lids normal, EOMI CV: RRR no M,R,G. Distal pulses +2.  No cyanosis.   RESP: CTA B/L over anterior, normal respiratory effort, on RA GI: +BS, NTND, PEG tube in place, rectal tube outputting black/grey liquid stool Extremities: No effusions, edema, or tenderness in BLE SKIN: warm, dry, extensive bruising   Consultants:   Nephrology  Infectious disease  Vascular surgery  Neurology  Critical care  specialist   Procedures:  Antimicrobials:  Vancomycin.  Data Reviewed: I have personally reviewed following labs and imaging studies  CBC: Recent Labs  Lab 03/23/19 1345 03/24/19 1317 03/25/19 1400 03/26/19 0755 03/28/19 0637  WBC  --   --   --   --  6.4  HGB 8.3* 8.4* 7.9* 8.5* 7.9*  HCT 26.4* 27.0* 25.6* 28.0* 26.1*  MCV  --   --   --   --  102.8*  PLT  --   --   --   --  [id number]   Basic Metabolic Panel: Recent Labs  Lab 03/24/19 1317 03/25/19 1400 03/26/19 0755 03/27/19 2010 03/28/19 0637  NA 138 136 142 136 138  K 3.9 4.1 4.1 5.0 4.4  CL 99 99 100 100 100  CO2 28 27 32 28 29  GLUCOSE 123* 292* 207* 184* 180*  BUN 31* 49* 30* 27* 36*  CREATININE 2.82* 3.51* 2.33* 1.91* 2.15*  CALCIUM 8.6* 8.3* 8.4* 8.3* 8.7*  PHOS 4.0 5.4* 3.3 2.6 3.0   GFR: Estimated Creatinine Clearance: 26.9 mL/min (A) (by C-G formula based on SCr of 2.15 mg/dL (H)). Liver Function Tests: Recent  Labs  Lab 03/24/19 1317 03/25/19 1400 03/26/19 0755 03/27/19 2010 03/28/19 0637  ALBUMIN 1.9* 1.8* 1.9* 1.8* 1.8*   No results for input(s): LIPASE, AMYLASE in the last 168 hours. No results for input(s): AMMONIA in the last 168 hours. Coagulation Profile: No results for input(s): INR, PROTIME in the last 168 hours. Cardiac Enzymes: No results for input(s): CKTOTAL, CKMB, CKMBINDEX, TROPONINI in the last 168 hours. BNP (last 3 results) No results for input(s): PROBNP in the last 8760 hours. HbA1C: No results for input(s): HGBA1C in the last 72 hours. CBG: Recent Labs  Lab 03/27/19 2331 03/28/19 0352 03/28/19 0815 03/28/19 1145 03/28/19 1617  GLUCAP 191* 184* 211* 189* 146*   Lipid Profile: No results for input(s): CHOL, HDL, LDLCALC, TRIG, CHOLHDL, LDLDIRECT in the last 72 hours. Thyroid Function Tests: No results for input(s): TSH, T4TOTAL, FREET4, T3FREE, THYROIDAB in the last 72 hours. Anemia Panel: No results for input(s): VITAMINB12, FOLATE, FERRITIN, TIBC, IRON, RETICCTPCT  in the last 72 hours. Sepsis Labs: No results for input(s): PROCALCITON, LATICACIDVEN in the last 168 hours.  Recent Results (from the past 240 hour(s))  Anaerobic culture     Status: None   Collection Time: 03/20/19 12:40 PM   Specimen: PATH Cytology CSF; Cerebrospinal Fluid  Result Value Ref [place] Status   Specimen Description   Final    CSF Performed at [facility], [address], [place], [place] [address]    Special Requests   Final    NONE Performed at [facility], [address] [place], [place] [address]    Gram Stain   Final    WBC PRESENT, PREDOMINANTLY MONONUCLEAR NO ORGANISMS SEEN CYTOSPIN SMEAR    Culture   Final    NO ANAEROBES ISOLATED Performed at [facility], [place] [address], [place], [place] [address]    Report Status 03/27/2019 FINAL  Final  CSF culture     Status: None   Collection Time: 03/20/19 12:40 PM   Specimen: PATH Cytology CSF; Cerebrospinal Fluid  Result Value Ref [place] Status   Specimen Description   Final    CSF Performed at [facility], [address], [place], [place] [address]    Special Requests   Final    NONE Performed at [facility], [address] [place], [place] [address]    Gram Stain   Final    WBC PRESENT, PREDOMINANTLY MONONUCLEAR NO ORGANISMS SEEN CYTOSPIN SMEAR    Culture   Final    NO GROWTH 3 DAYS Performed at [facility], [place] [address] [place], [place] [address]    Report Status 03/24/2019 FINAL  Final  Fungus Culture With Stain     Status: None (Preliminary result)   Collection Time: 03/20/19 12:40 PM   Specimen: PATH Cytology CSF; Cerebrospinal Fluid  Result Value Ref [place] Status   Fungus Stain Final report  Final    Comment: (NOTE) Performed At: [facility] [address] [place], [place] [id number] Rush Farmer MD [id number]    Fungus (Mycology) Culture PENDING  Incomplete   Fungal Source CSF  Final    Comment: Performed at  [facility], [address] [place], [place] [address]  Fungus Culture Result     Status: None   Collection Time: 03/20/19 12:40 PM  Result Value Ref [place] Status   Result 1 Comment  Final    Comment: (NOTE) KOH/Calcofluor preparation:  no fungus observed. Performed At: [facility] [phone]  [place], [place] [id number] Rush Farmer MD [id number]      Radiology Studies: No results found.  Scheduled Meds: . acidophilus  2 capsule Oral BID  . alteplase  2 mg Intracatheter Once  . alteplase  2 mg Intracatheter Once  . apixaban  5 mg Oral BID  . aspirin  81 mg Per Tube Daily  . B-complex with vitamin C  1 tablet Per Tube Daily  . Chlorhexidine Gluconate Cloth  6 each Topical Q0600  . epoetin (EPOGEN/PROCRIT) injection  10,000 Units Intravenous Q M,W,F-HD  . feeding supplement (PRO-STAT SUGAR FREE 64)  30 mL Per Tube Daily  . ferrous gluconate  324 mg Oral Daily  . fluticasone  2 spray Each Nare Daily  . free water  20 mL Per Tube Q4H  . insulin aspart  0-15 Units Subcutaneous Q4H  . insulin glargine  10 Units Subcutaneous Daily  . levothyroxine  100 mcg Per Tube QAC breakfast  . mouth rinse  15 mL Mouth Rinse BID  . pantoprazole (PROTONIX) IV  40 mg Intravenous Q12H  . psyllium  1 packet Per Tube TID  . zinc sulfate  220 mg Per Tube Daily   Continuous Infusions: . sodium chloride 20 mL (03/23/19 1852)  . feeding supplement (OSMOLITE 1.5 CAL) 1,000 mL (03/25/19 0443)  . vancomycin Stopped (03/25/19 1734)     LOS: 92 days    Enzo Bi, MD [facility]   If 7PM-7AM, please contact night-coverage

## 2019-03-28 NOTE — Progress Notes (Signed)
[place], [place] 03/28/19  Subjective:  Patient remains quite lethargic. Had dialysis yesterday. Husband at bedside.    Objective:  Vital signs in last 24 hours:  Temp:  [98.5 F (36.9 C)-98.7 F (37.1 C)] 98.7 F (37.1 C) (01/23 1145) Pulse Rate:  [78-83] 78 (01/23 1145) Resp:  [18-20] 18 (01/23 1145) BP: (136-141)/(45-56) 136/56 (01/23 1145) SpO2:  [98 %-99 %] 98 % (01/23 1145)  Weight change:  Filed Weights   03/25/19 1400 03/27/19 0725 03/27/19 1045  Weight: 81.7 kg 82.2 kg 81 kg    Intake/Output:    Intake/Output Summary (Last 24 hours) at 03/28/2019 1454 Last data filed at 03/27/2019 2300 Gross per 24 hour  Intake 2859 ml  Output --  Net 2859 ml     Physical Exam: General:  Chronically ill-appearing, laying in the bed  HEENT  anicteric, moist oral mucous membranes  Pulm/lungs  shallow breathing effort, no crackles  CVS/Heart  irregular rhythm, no rub  Abdomen:   Soft, nontender, PEG tube in place  Extremities:  Bilateral upper extremity 2+ edema  Neurologic:  lethargic today  Skin:  scattered Ecchymosis  Access:  PermCath Left IJ       Basic Metabolic Panel:  Recent Labs  Lab 03/24/19 1317 03/24/19 1317 03/25/19 1400 03/25/19 1400 03/26/19 0755 03/27/19 2010 03/28/19 0637  NA 138  --  136  --  142 136 138  K 3.9  --  4.1  --  4.1 5.0 4.4  CL 99  --  99  --  100 100 100  CO2 28  --  27  --  32 28 29  GLUCOSE 123*  --  292*  --  207* 184* 180*  BUN 31*  --  49*  --  30* 27* 36*  CREATININE 2.82*  --  3.51*  --  2.33* 1.91* 2.15*  CALCIUM 8.6*   < > 8.3*   < > 8.4* 8.3* 8.7*  PHOS 4.0  --  5.4*  --  3.3 2.6 3.0   < > = values in this interval not displayed.     CBC: Recent Labs  Lab 03/23/19 1345 03/24/19 1317 03/25/19 1400 03/26/19 0755 03/28/19 0637  WBC  --   --   --   --  6.4  HGB 8.3* 8.4* 7.9* 8.5* 7.9*  HCT 26.4* 27.0* 25.6* 28.0* 26.1*  MCV  --   --   --   --  102.8*  PLT  --   --   --    --  282      Lab Results  Component Value Date   HEPBSAG NON REACTIVE 03/18/2019      Microbiology:  Recent Results (from the past 240 hour(s))  Anaerobic culture     Status: None   Collection Time: 03/20/19 12:40 PM   Specimen: PATH Cytology CSF; Cerebrospinal Fluid  Result Value Ref Range Status   Specimen Description   Final    CSF Performed at [facility], [address], [place], [place] [address]    Special Requests   Final    NONE Performed at [facility], [place] [place], [place] [address]    Gram Stain   Final    WBC PRESENT, PREDOMINANTLY MONONUCLEAR NO ORGANISMS SEEN CYTOSPIN SMEAR    Culture   Final    NO ANAEROBES ISOLATED Performed at [facility], [place] [address] [place], [place] [address]    Report Status 03/27/2019  FINAL  Final  CSF culture     Status: None   Collection Time: 03/20/19 12:40 PM   Specimen: PATH Cytology CSF; Cerebrospinal Fluid  Result Value Ref Range Status   Specimen Description   Final    CSF Performed at [facility], [address], [place], [place] [address]    Special Requests   Final    NONE Performed at [facility], [address] [place], [place] [address]    Gram Stain   Final    WBC PRESENT, PREDOMINANTLY MONONUCLEAR NO ORGANISMS SEEN CYTOSPIN SMEAR    Culture   Final    NO GROWTH 3 DAYS Performed at [facility], [place] [address] [place], [place] [address]    Report Status 03/24/2019 FINAL  Final  Fungus Culture With Stain     Status: None (Preliminary result)   Collection Time: 03/20/19 12:40 PM   Specimen: PATH Cytology CSF; Cerebrospinal Fluid  Result Value Ref Range Status   Fungus Stain Final report  Final    Comment: (NOTE) Performed At: [facility] [address] [place], [place] [id number] Rush Farmer MD [id number]    Fungus (Mycology) Culture PENDING  Incomplete   Fungal Source CSF  Final    Comment: Performed at  [facility], [address] [place], [place] [address]  Fungus Culture Result     Status: None   Collection Time: 03/20/19 12:40 PM  Result Value Ref Range Status   Result 1 Comment  Final    Comment: (NOTE) KOH/Calcofluor preparation:  no fungus observed. Performed At: [facility] [place], [place] [id number] Rush Farmer MD [id number]     Coagulation Studies: No results for input(s): LABPROT, INR in the last 72 hours.  Urinalysis: No results for input(s): COLORURINE, LABSPEC, PHURINE, GLUCOSEU, HGBUR, BILIRUBINUR, KETONESUR, PROTEINUR, UROBILINOGEN, NITRITE, LEUKOCYTESUR in the last 72 hours.  Invalid input(s): APPERANCEUR    Imaging: No results found.   Medications:   . sodium chloride 20 mL (03/23/19 1852)  . feeding supplement (OSMOLITE 1.5 CAL) 1,000 mL (03/25/19 0443)  . vancomycin Stopped (03/25/19 1734)   . acidophilus  2 capsule Oral BID  . alteplase  2 mg Intracatheter Once  . alteplase  2 mg Intracatheter Once  . apixaban  5 mg Oral BID  . aspirin  81 mg Per Tube Daily  . B-complex with vitamin C  1 tablet Per Tube Daily  . Chlorhexidine Gluconate Cloth  6 each Topical Q0600  . epoetin (EPOGEN/PROCRIT) injection  10,000 Units Intravenous Q M,W,F-HD  . feeding supplement (PRO-STAT SUGAR FREE 64)  30 mL Per Tube Daily  . ferrous gluconate  324 mg Oral Daily  . fluticasone  2 spray Each Nare Daily  . free water  20 mL Per Tube Q4H  . insulin aspart  0-15 Units Subcutaneous Q4H  . insulin glargine  10 Units Subcutaneous Daily  . levothyroxine  100 mcg Per Tube QAC breakfast  . mouth rinse  15 mL Mouth Rinse BID  . pantoprazole (PROTONIX) IV  40 mg Intravenous Q12H  . psyllium  1 packet Per Tube TID  . zinc sulfate  220 mg Per Tube Daily   sodium chloride, acetaminophen, ipratropium-albuterol, ondansetron (ZOFRAN) IV, traMADol  Assessment/ Plan:  64 y.o. female with  end-stage renal disease on hemodialysis, atrial  fibrillation, chronic diastolic congestive heart failure, coronary artery disease status post CABG, diabetes mellitus type II, hypertension, hyperlipidemia, hypothyroidism, Covid positive in July 2020,  recent nursing home stay in [facility], Prolonged hospitalization for MRSA bacteremia/sepsis  admitted on 02/09/2019 for ESRD (end stage renal disease) ([place]) [N18.6] Altered mental status, unspecified altered mental status type [R41.82] Chest pain, unspecified type [R07.9] Sepsis, due to unspecified organism, unspecified whether acute organ dysfunction present ([place]) [A41.9]  [facility] [place]   #End-stage renal disease Patient underwent dialysis yesterday.  Tolerated well.  Next dialysis treatment for Monday.  #Acute encephalopathy Nonverbal today.  Does track with eyes.  #MRSA sepsis Right arm AV graft removed this admission on March 04, 2019  #Chronic hypotension Consider restarting midodrine as deemed necessary.  #Anemia of chronic kidney disease Continue Epogen 10,000 IV with dialysis. Lab Results  Component Value Date   HGB 7.9 (L) 03/28/2019      LOS: 40 Jamie Arias 1/23/20212:54 PM  [place], [place]  Note: This note was prepared with Dragon dictation. Any transcription errors are unintentional

## 2019-03-28 NOTE — Evaluation (Addendum)
Physical Therapy Re-Evaluation Patient Details Name: Jamie Arias MRN: [id number] DOB: 07-Aug-1955 Today's Date: 03/28/2019   History of Present Illness  Pt is a 64 y.o. female presenting to hospital 02/09/19 with AMS.  Pt admitted with MRSA sepsis and bacteremia with hypotension, toxic metabolic encephalopathy, ESRD on HD, hypokalemia, elevated troponin, a-fib, and anemia of CKD.  LUE DVT diagnosed 03/03/19.  Of note pt had been intubated but was extubated 03/04/20.  PMH includes ESRD on HD, a-fib, chronic diastolic CHF, CAD s/p CABG, DM, htn, COVID (+) July 2020.  Clinical Impression  New PT consult received for SNF rehab placement so PT re-evaluation performed. Prior to June 2020 pt was independent with ambulation but pt then with extended hospitalization and went to rehab facility.   Pt discharged home from rehab mid November and pt has been using sit to stand lift for transfers (pt's husband reports pt had fairly good sitting balance though).  Pt with eyes closed upon PT entry but within a few minutes pt had her eyes open although pt did not verbalize anything beginning or middle of session.  Pt did not initiate any movement when cued during session.  B heelscords tight and positioned in PF; pt fingers (B UE's) positioned in flexed position and unable to straighten fully.  L UE/LE appearing with increased tone vs resistance with movement compared to R side.  Towards end of session pt did nod her head yes when asked if she was doing ok.  When therapist was leaving room and said bye to the pt, the pt then vocalized "bye sweetie".  Pt would benefit from skilled PT trial to address noted impairments and functional limitations (see below for any additional details).  Upon hospital discharge, anticipate pt would benefit from [facility].    Follow Up Recommendations [facility] bed    Recommendations for Other Services       Precautions / Restrictions  Precautions Precautions: Fall Precaution Comments: L IJ HD permcath; NPO; PEG tube; aspiration precautions; rectal tube; elevate R UE Restrictions Weight Bearing Restrictions: No      Mobility  Bed Mobility               General bed mobility comments: Pt did not initiate to assist in order to attempt bed mobility  Transfers                 General transfer comment: Pt did not initiate to assist in order to attempt any mobility  Ambulation/Gait             General Gait Details: Pt using sit to stand lift prior to hospitalization (pt's husband reports pt did have fairly good sitting balance prior to hospitalization though)  Stairs            Wheelchair Mobility    Modified Rankin (Stroke Patients Only)       Balance                                             Pertinent Vitals/Pain Pain Assessment: Faces Faces Pain Scale: No hurt(no pain noted at rest; pt vocalizing when attempting to straighten/extend fingers on both hands) Pain Location: B hands when therapist performing PROM finger extension Pain Descriptors / Indicators: Moaning Pain Intervention(s): Limited activity within patient's tolerance;Monitored during session;Repositioned     Home Living Family/patient  expects to be discharged to:: Private residence Living Arrangements: Spouse/significant other Available Help at Discharge: Family;Available 24 hours/day Type of Home: Mobile home Home Access: Stairs to enter;Ramped entrance   Entrance Stairs-Number of Steps: 8 Home Layout: One level Home Equipment: Walker - 2 wheels;Wheelchair - [employer];Hospital bed;Shower seat(Sit to stand lift) Additional Comments: Pt's husband reports pt was independent prior to hospitalization for COVID in June 2020; pt had prolonged hospitalization but discharged late July; came home from rehab in mid November (using sit to stand mechanical lift and hospital bed).  Has been on  dialysis for 3 years (used a transport chair when going out to HD prior to June 2020).    Prior Function     Gait / Transfers Assistance Needed: See additional comments in home living above (pt has not been ambulatory since June 2020)           Hand Dominance   Dominant Hand: Left    Extremity/Trunk Assessment   Upper Extremity Assessment Upper Extremity Assessment: Difficult to assess due to impaired cognition(unable to fully extend B UE fingers; L elbow flexion grossly 70 degrees PROM and R grossly 90 degrees elbow flexion PROM; B shoulder flexion grossly 30 degrees flexion (unsure if pt resisting this movement))    Lower Extremity Assessment Lower Extremity Assessment: Difficult to assess due to impaired cognition(B heelcords tight and positioned in PF (grossly 30 degrees short of neutral))       Communication   Communication: Other (comment)(Minimal verbalizations noted.)  Cognition Arousal/Alertness: (pt initially with eyes closed but with cueing pt with eyes open rest of session) Behavior During Therapy: Flat affect Overall Cognitive Status: Impaired/Different from baseline Area of Impairment: Following commands                       Following Commands: Follows one step commands inconsistently       General Comments: pt did not answer any orientation questions; minimal initiation of movement; significant difficulty following 1 step commands      General Comments General comments (skin integrity, edema, etc.): R hand appearing more swollen than L hand (R UE elevated on pillow to decrease swelling)    Exercises Other Exercises Other Exercises: B heelcord stretching 2x30 seconds; x10 reps PROM B hip/knee flexion/extension; gentle PROM B UE fingers to promote finger extension ROM   Assessment/Plan    PT Assessment Patient needs continued PT services  PT Problem List Decreased strength;Decreased range of motion;Decreased balance;Decreased  mobility;Decreased activity tolerance;Decreased cognition;Decreased knowledge of use of DME;Decreased safety awareness;Decreased knowledge of precautions;Pain       PT Treatment Interventions DME instruction;Functional mobility training;Therapeutic activities;Therapeutic exercise;Balance training;Patient/family education;Cognitive remediation;Neuromuscular re-education    PT Goals (Current goals can be found in the Care Plan section)  Acute Rehab PT Goals Patient Stated Goal: to improve mobility PT Goal Formulation: With family Time For Goal Achievement: 04/11/19 Potential to Achieve Goals: Fair    Frequency Min 2X/week   Barriers to discharge Decreased caregiver support      Co-evaluation               AM-PAC PT "6 Clicks" Mobility  Outcome Measure Help needed turning from your back to your side while in a flat bed without using bedrails?: Total Help needed moving from lying on your back to sitting on the side of a flat bed without using bedrails?: Total Help needed moving to and from a bed to a chair (including a wheelchair)?: Total Help needed  standing up from a chair using your arms (e.g., wheelchair or bedside chair)?: Total Help needed to walk in hospital room?: Total Help needed climbing 3-5 steps with a railing? : Total 6 Click Score: 6    End of Session   Activity Tolerance: Patient tolerated treatment well Patient left: in bed;with call bell/phone within reach;with bed alarm set;with nursing/sitter in room;with family/visitor present;Other (comment)(B PRAFO's in place; B UE's elevated on pillows) Nurse Communication: Mobility status;Precautions PT Visit Diagnosis: Other abnormalities of gait and mobility (R26.89);Muscle weakness (generalized) (M62.81);Difficulty in walking, not elsewhere classified (R26.2)    Time: [id number] PT Time Calculation (min) (ACUTE ONLY): 30 min   Charges:   PT Evaluation $PT Re-evaluation: 1 Re-eval PT Treatments $Therapeutic  Exercise: 8-22 mins        Leitha Bleak, PT 03/28/19, 2:42 PM

## 2019-03-28 NOTE — Progress Notes (Signed)
She said "morning" to nurse this am, when spoken too.

## 2019-03-29 LAB — RENAL FUNCTION PANEL
Albumin: 2 g/dL — ABNORMAL LOW (ref 3.5–5.0)
Anion gap: 10 (ref 5–15)
BUN: 53 mg/dL — ABNORMAL HIGH (ref 8–23)
CO2: 27 mmol/L (ref 22–32)
Calcium: 8.7 mg/dL — ABNORMAL LOW (ref 8.9–10.3)
Chloride: 97 mmol/L — ABNORMAL LOW (ref 98–111)
Creatinine, Ser: 2.76 mg/dL — ABNORMAL HIGH (ref 0.44–1.00)
GFR calc Af Amer: 20 mL/min — ABNORMAL LOW (ref >60)
GFR calc non Af Amer: 17 mL/min — ABNORMAL LOW (ref >60)
Glucose, Bld: 204 mg/dL — ABNORMAL HIGH (ref 70–99)
Phosphorus: 3.8 mg/dL (ref 2.5–4.6)
Potassium: 5.1 mmol/L (ref 3.5–5.1)
Sodium: 134 mmol/L — ABNORMAL LOW (ref 135–145)

## 2019-03-29 LAB — CBC
HCT: 25.9 % — ABNORMAL LOW (ref 36.0–46.0)
Hemoglobin: 7.9 g/dL — ABNORMAL LOW (ref 12.0–15.0)
MCH: 31.1 pg (ref 26.0–34.0)
MCHC: 30.5 g/dL (ref 30.0–36.0)
MCV: 102 fL — ABNORMAL HIGH (ref 80.0–100.0)
Platelets: 289 10*3/uL (ref 150–400)
RBC: 2.54 MIL/uL — ABNORMAL LOW (ref 3.87–5.11)
RDW: 18.4 % — ABNORMAL HIGH (ref 11.5–15.5)
WBC: 6.1 10*3/uL (ref 4.0–10.5)
nRBC: 0 % (ref 0.0–0.2)

## 2019-03-29 LAB — GLUCOSE, CAPILLARY
Glucose-Capillary: 120 mg/dL — ABNORMAL HIGH (ref 70–99)
Glucose-Capillary: 146 mg/dL — ABNORMAL HIGH (ref 70–99)
Glucose-Capillary: 152 mg/dL — ABNORMAL HIGH (ref 70–99)
Glucose-Capillary: 169 mg/dL — ABNORMAL HIGH (ref 70–99)
Glucose-Capillary: 178 mg/dL — ABNORMAL HIGH (ref 70–99)
Glucose-Capillary: 190 mg/dL — ABNORMAL HIGH (ref 70–99)
Glucose-Capillary: 195 mg/dL — ABNORMAL HIGH (ref 70–99)

## 2019-03-29 NOTE — Progress Notes (Signed)
[place], [place] 03/29/19  Subjective:  Patient due for dialysis again tomorrow. Husband at bedside today as well. Eyes are open but not verbal.    Objective:  Vital signs in last 24 hours:  Temp:  [98.3 F (36.8 C)-99.1 F (37.3 C)] 98.6 F (37 C) (01/24 1139) Pulse Rate:  [74-77] 77 (01/24 1139) Resp:  [16-20] 16 (01/24 1139) BP: (140-147)/(48-54) 140/51 (01/24 1139) SpO2:  [99 %-100 %] 100 % (01/24 1139)  Weight change:  Filed Weights   03/25/19 1400 03/27/19 0725 03/27/19 1045  Weight: 81.7 kg 82.2 kg 81 kg    Intake/Output:    Intake/Output Summary (Last 24 hours) at 03/29/2019 1305 Last data filed at 03/28/2019 1700 Gross per 24 hour  Intake 375 ml  Output --  Net 375 ml     Physical Exam: General:  Chronically ill-appearing, laying in the bed  HEENT  anicteric, moist oral mucous membranes  Pulm/lungs  shallow breathing effort, no crackles  CVS/Heart  irregular rhythm, no rub  Abdomen:   Soft, nontender, PEG tube in place  Extremities:  Bilateral upper extremity 2+ edema  Neurologic:  Awake but not conversant  Skin:  scattered Ecchymosis  Access:  PermCath Left IJ       Basic Metabolic Panel:  Recent Labs  Lab 03/25/19 1400 03/25/19 1400 03/26/19 0755 03/26/19 0755 03/27/19 2010 03/28/19 0637 03/29/19 0348  NA 136  --  142  --  136 138 134*  K 4.1  --  4.1  --  5.0 4.4 5.1  CL 99  --  100  --  100 100 97*  CO2 27  --  32  --  28 29 27   GLUCOSE 292*  --  207*  --  184* 180* 204*  BUN 49*  --  30*  --  27* 36* 53*  CREATININE 3.51*  --  2.33*  --  1.91* 2.15* 2.76*  CALCIUM 8.3*   < > 8.4*   < > 8.3* 8.7* 8.7*  PHOS 5.4*  --  3.3  --  2.6 3.0 3.8   < > = values in this interval not displayed.     CBC: Recent Labs  Lab 03/24/19 1317 03/25/19 1400 03/26/19 0755 03/28/19 0637 03/29/19 0348  WBC  --   --   --  6.4 6.1  HGB 8.4* 7.9* 8.5* 7.9* 7.9*  HCT 27.0* 25.6* 28.0* 26.1* 25.9*  MCV  --   --   --   102.8* 102.0*  PLT  --   --   --  282 289      Lab Results  Component Value Date   HEPBSAG NON REACTIVE 03/18/2019      Microbiology:  Recent Results (from the past 240 hour(s))  Anaerobic culture     Status: None   Collection Time: 03/20/19 12:40 PM   Specimen: PATH Cytology CSF; Cerebrospinal Fluid  Result Value Ref Range Status   Specimen Description   Final    CSF Performed at [facility], [address], [place], [place] [address]    Special Requests   Final    NONE Performed at [facility], [address] [place], [place] [address]    Gram Stain   Final    WBC PRESENT, PREDOMINANTLY MONONUCLEAR NO ORGANISMS SEEN CYTOSPIN SMEAR    Culture   Final    NO ANAEROBES ISOLATED Performed at [facility], [place] [address] [place], [place] [address]  Report Status 03/27/2019 FINAL  Final  CSF culture     Status: None   Collection Time: 03/20/19 12:40 PM   Specimen: PATH Cytology CSF; Cerebrospinal Fluid  Result Value Ref Range Status   Specimen Description   Final    CSF Performed at [facility], [address] [place], [place] [address]    Special Requests   Final    NONE Performed at [facility], [address] [place], [place] [address]    Gram Stain   Final    WBC PRESENT, PREDOMINANTLY MONONUCLEAR NO ORGANISMS SEEN CYTOSPIN SMEAR    Culture   Final    NO GROWTH 3 DAYS Performed at [facility], [place] [address] [place], [place] [address]    Report Status 03/24/2019 FINAL  Final  Fungus Culture With Stain     Status: None (Preliminary result)   Collection Time: 03/20/19 12:40 PM   Specimen: PATH Cytology CSF; Cerebrospinal Fluid  Result Value Ref Range Status   Fungus Stain Final report  Final    Comment: (NOTE) Performed At: [facility] [address] [place], [place] [id number] Rush Farmer MD [id number]    Fungus (Mycology) Culture PENDING  Incomplete   Fungal Source CSF   Final    Comment: Performed at [facility], [address] [place], [place] [address]  Fungus Culture Result     Status: None   Collection Time: 03/20/19 12:40 PM  Result Value Ref Range Status   Result 1 Comment  Final    Comment: (NOTE) KOH/Calcofluor preparation:  no fungus observed. Performed At: [facility] [place], [place] [id number] Rush Farmer MD [id number]     Coagulation Studies: No results for input(s): LABPROT, INR in the last 72 hours.  Urinalysis: No results for input(s): COLORURINE, LABSPEC, PHURINE, GLUCOSEU, HGBUR, BILIRUBINUR, KETONESUR, PROTEINUR, UROBILINOGEN, NITRITE, LEUKOCYTESUR in the last 72 hours.  Invalid input(s): APPERANCEUR    Imaging: No results found.   Medications:   . sodium chloride 20 mL (03/23/19 1852)  . feeding supplement (OSMOLITE 1.5 CAL) 1,000 mL (03/25/19 0443)  . vancomycin Stopped (03/25/19 1734)   . acidophilus  2 capsule Oral BID  . alteplase  2 mg Intracatheter Once  . alteplase  2 mg Intracatheter Once  . apixaban  5 mg Oral BID  . aspirin  81 mg Per Tube Daily  . B-complex with vitamin C  1 tablet Per Tube Daily  . Chlorhexidine Gluconate Cloth  6 each Topical Q0600  . epoetin (EPOGEN/PROCRIT) injection  10,000 Units Intravenous Q M,W,F-HD  . feeding supplement (PRO-STAT SUGAR FREE 64)  30 mL Per Tube Daily  . ferrous gluconate  324 mg Oral Daily  . fluticasone  2 spray Each Nare Daily  . free water  20 mL Per Tube Q4H  . insulin aspart  0-15 Units Subcutaneous Q4H  . insulin glargine  10 Units Subcutaneous Daily  . levothyroxine  100 mcg Per Tube QAC breakfast  . mouth rinse  15 mL Mouth Rinse BID  . pantoprazole (PROTONIX) IV  40 mg Intravenous Q12H  . psyllium  1 packet Per Tube TID  . zinc sulfate  220 mg Per Tube Daily   sodium chloride, acetaminophen, ipratropium-albuterol, ondansetron (ZOFRAN) IV, traMADol  Assessment/ Plan:  64 y.o. female with  end-stage renal  disease on hemodialysis, atrial fibrillation, chronic diastolic congestive heart failure, coronary artery disease status post CABG, diabetes mellitus type II, hypertension, hyperlipidemia, hypothyroidism, Covid positive  in July 2020, recent nursing home stay in [place], Prolonged hospitalization for MRSA bacteremia/sepsis  admitted on 02/09/2019 for ESRD (end stage renal disease) ([place]) [N18.6] Altered mental status, unspecified altered mental status type [R41.82] Chest pain, unspecified type [R07.9] Sepsis, due to unspecified organism, unspecified whether acute organ dysfunction present ([place]) [A41.9]  [facility] [place]   #End-stage renal disease Patient due for dialysis treatment again tomorrow.  Orders to be prepared.  #Acute encephalopathy Patient is awake today but not conversant.  #MRSA sepsis Right arm AV graft removed this admission on March 04, 2019  #Chronic hypotension Consider restarting midodrine as deemed necessary,, blood pressure today 140/51.  #Anemia of chronic kidney disease Continue Epogen 10,000 IV with dialysis. Lab Results  Component Value Date   HGB 7.9 (L) 03/29/2019      LOS: 3 Yahshua Thibault 1/24/20211:05 PM  [place], [place]  Note: This note was prepared with Dragon dictation. Any transcription errors are unintentional

## 2019-03-29 NOTE — Progress Notes (Signed)
PROGRESS NOTE    Jamie Arias  [id number] DOB: 10-10-1955 DOA: 02/09/2019 PCP: [name], MD   Brief Narrative:  [name] is a 64 y.o. white female with end-stage renal disease on hemodialysis, atrial fibrillation, chronic diastolic congestive heart failure, coronary artery disease status post CABG, diabetes mellitus type II, hypertension, hyperlipidemia, hypothyroidism, Covid positive in July 2020, recent nursing home stay in [place]. Prolonged hospitalization for MRSA bacteremia/sepsis and now with acute encephalopathy.  Subjective: Pt has been using more words, per nursing staff.  Still didn't answer any questions from me.    Rectal tube popped out last night, but pt's stools have been noted to be more formed.  No noted fever, N/V.     Assessment & Plan:   Principal Problem:   Sepsis due to methicillin resistant Staphylococcus aureus (MRSA) ([place]) Active Problems:   Type 2 diabetes mellitus with ESRD (end-stage renal disease) (HCC)   Atrial fibrillation, chronic (HCC)   Hypotension   Chronic diastolic CHF (congestive heart failure) (HCC)   ESRD (end stage renal disease) (HCC)   Altered level of consciousness   Decubitus ulcer of heel, bilateral, stage 2 (HCC)   MRSA bacteremia   Arm DVT (deep venous thromboembolism), acute, left (HCC)   Acute encephalopathy   Acute metabolic encephalopathy   Acute blood loss anemia   AF (paroxysmal atrial fibrillation) ([place])   Diarrhea   Oropharyngeal dysphagia   Abscess  Sepsis secondary to MRSA bacteremia.  No obvious source.  Right arm graft was removed on 03/04/2019. -Continue with vancomycin for 6-week, will finish her course on 04/01/2019.      Acute metabolic encephalopathy:  mental status remains mostly unchanged -  MRI of brain and EEG negative. Patient remained afebrile with no leukocytosis.  - B12 within normal range  -  She was started on thiamine challenge by neurology but not much improvement. - IR   performed LP on 03/20/19 AM: CSF shows only 8 WBC, Protein elevated at 73, Glucose 83. Negative for Toxo. CSF Cx final -- negative. Anaerobic and fungal Cx pending   -CSF/ other la, ID, Neurology assistance   - Repeat TSH was elevated. - Increased dose of Synthroid to 100 mcg -Overall prognosis seems poor.  -Palliative care consulted.  They have been following the patient and in touch with husband.  In the meantime continue medical treatment.  Bleeding from catheter site with multiple ecchymosis.  No new episode over the past 24 to 48 hours -  warfarin was discontinued due to concern for necrosis of skin.  Hemoglobin dropped to 6.8; improved to 8.7 and remains mostly above 8 -continue eliquis  Vaginal discharge, resolved There was some nursing concern of vaginal discharge  -there is some yeast but Gyn feels this may be colonization - may not need treatment. She is not symptomatic -Since culture grew E. coli and Klebsiella; contamination questionable  - Unasyn course completed on 03/23/19 (4 doses)  Anemia of chronic illness.  Status post transfusion 1 unit of PRBC on 03/17/2019 -  hemoglobin stable above 8.  Iron studies consistent with anemia of chronic illness.  MCV elevated.  -B12 within normal range -Continue EPO with dialysis.  Hypotension, chronic  consider restarting midodrine if BP drops again Also getting IV albumin intermittently during dialysis  ESRD (MWF).   Currently using tunneled catheter as graft was removed due to MRSA bacteremia. -Continue with scheduled dialysis per nephrology  Left arm DVT.  Currently on Eliquis - recent  diagnosis of left arm DVT on 03/03/2019. -Discontinue Coumadin due to concern for Coumadin toxicity/side effect due to her skin necrotic lesions.  Hypothyroidism.  Repeat TSH elevated at 17.82. -continue Increased dose of Synthroid 100. - repeat TSH in 2 to 3 weeks.  Paroxysmal A. Fib.. Currently in sinus. Amiodarone was discontinued. -Continue  Eliquis  Type 2 diabetes. -Continue SSI.  Chronic diastolic heart failure.  Volume being managed by dialysis.  Bilateral decubitus ulcers.  Present on admission. -Continue with wound care.  Nutrition.  PEG placed 03/03/2019.  Continue tube feeding.  Acute hypoxic respiratory failure.  Resolved.  Patient was intubated initially, extubated on 03/05/2019. -On room air now  Wound care nurse to follow sacral wound; consulted again on 03/24/19   Watery output per rectal tube --No fever, leukocytosis to suggest infection, per ID.  Likely due to tube feed plus free water. --Rectal tube fell out night of 1/23 PLAN: --continue bulking agent   DVT prophylaxis: Eliquis Code Status: DNR Family Communication: updated husband at bedside Disposition Plan: Waiting for bed offer at [facility] Considered SNF , though pt currently not able to participate in PT, so PT can not rec SNF rehab.   Objective: Vitals:   03/28/19 2017 03/28/19 2018 03/29/19 0448 03/29/19 1139  BP:  (!) 141/54 (!) 147/48 (!) 140/51  Pulse: 77 75 74 77  Resp: 20  20 16   Temp: 99.1 F (37.3 C)  98.3 F (36.8 C) 98.6 F (37 C)  TempSrc: Oral  Oral Oral  SpO2: 100%  99% 100%  Weight:      Height:        Intake/Output Summary (Last 24 hours) at 03/29/2019 1646 Last data filed at 03/28/2019 1700 Gross per 24 hour  Intake 375 ml  Output --  Net 375 ml   Filed Weights   03/25/19 1400 03/27/19 0725 03/27/19 1045  Weight: 81.7 kg 82.2 kg 81 kg    Examination:  Constitutional: NAD, awake, receiving cleaning HEENT: conjunctivae and lids normal, EOMI CV: RRR 2+ systolic murmur at upper sternal boarders. Distal pulses +2.  No cyanosis.   RESP: CTA B/L over anterior, normal respiratory effort, on RA GI: +BS, NTND, PEG tube in place Extremities: No effusions, edema, or tenderness in BLE SKIN: warm, dry, extensive bruising   Consultants:   Nephrology  Infectious disease  Vascular  surgery  Neurology  Critical care specialist   Procedures:  Antimicrobials:  Vancomycin.  Data Reviewed: I have personally reviewed following labs and imaging studies  CBC: Recent Labs  Lab 03/24/19 1317 03/25/19 1400 03/26/19 0755 03/28/19 0637 03/29/19 0348  WBC  --   --   --  6.4 6.1  HGB 8.4* 7.9* 8.5* 7.9* 7.9*  HCT 27.0* 25.6* 28.0* 26.1* 25.9*  MCV  --   --   --  102.8* 102.0*  PLT  --   --   --  282 [id number]   Basic Metabolic Panel: Recent Labs  Lab 03/25/19 1400 03/26/19 0755 03/27/19 2010 03/28/19 0637 03/29/19 0348  NA 136 142 136 138 134*  K 4.1 4.1 5.0 4.4 5.1  CL 99 100 100 100 97*  CO2 27 32 28 29 27   GLUCOSE 292* 207* 184* 180* 204*  BUN 49* 30* 27* 36* 53*  CREATININE 3.51* 2.33* 1.91* 2.15* 2.76*  CALCIUM 8.3* 8.4* 8.3* 8.7* 8.7*  PHOS 5.4* 3.3 2.6 3.0 3.8   GFR: Estimated Creatinine Clearance: 20.9 mL/min (A) (by C-G formula based on SCr of 2.76 mg/dL (  H)). Liver Function Tests: Recent Labs  Lab 03/25/19 1400 03/26/19 0755 03/27/19 2010 03/28/19 0637 03/29/19 0348  ALBUMIN 1.8* 1.9* 1.8* 1.8* 2.0*   No results for input(s): LIPASE, AMYLASE in the last 168 hours. No results for input(s): AMMONIA in the last 168 hours. Coagulation Profile: No results for input(s): INR, PROTIME in the last 168 hours. Cardiac Enzymes: No results for input(s): CKTOTAL, CKMB, CKMBINDEX, TROPONINI in the last 168 hours. BNP (last 3 results) No results for input(s): PROBNP in the last 8760 hours. HbA1C: No results for input(s): HGBA1C in the last 72 hours. CBG: Recent Labs  Lab 03/29/19 0004 03/29/19 0347 03/29/19 0747 03/29/19 1135 03/29/19 1557  GLUCAP 195* 190* 178* 152* 146*   Lipid Profile: No results for input(s): CHOL, HDL, LDLCALC, TRIG, CHOLHDL, LDLDIRECT in the last 72 hours. Thyroid Function Tests: No results for input(s): TSH, T4TOTAL, FREET4, T3FREE, THYROIDAB in the last 72 hours. Anemia Panel: No results for input(s): VITAMINB12,  FOLATE, FERRITIN, TIBC, IRON, RETICCTPCT in the last 72 hours. Sepsis Labs: No results for input(s): PROCALCITON, LATICACIDVEN in the last 168 hours.  Recent Results (from the past 240 hour(s))  Anaerobic culture     Status: None   Collection Time: 03/20/19 12:40 PM   Specimen: PATH Cytology CSF; Cerebrospinal Fluid  Result Value Ref Range Status   Specimen Description   Final    CSF Performed at [facility], [address] [place], [place] [address]    Special Requests   Final    NONE Performed at [facility], [address] [place], [place] [address]    Gram Stain   Final    WBC PRESENT, PREDOMINANTLY MONONUCLEAR NO ORGANISMS SEEN CYTOSPIN SMEAR    Culture   Final    NO ANAEROBES ISOLATED Performed at [facility], [place] [address] [place], [place] [address]    Report Status 03/27/2019 FINAL  Final  CSF culture     Status: None   Collection Time: 03/20/19 12:40 PM   Specimen: PATH Cytology CSF; Cerebrospinal Fluid  Result Value Ref Range Status   Specimen Description   Final    CSF Performed at [facility], [address] [place], [place] [address]    Special Requests   Final    NONE Performed at [facility], [address] [place], [place] [address]    Gram Stain   Final    WBC PRESENT, PREDOMINANTLY MONONUCLEAR NO ORGANISMS SEEN CYTOSPIN SMEAR    Culture   Final    NO GROWTH 3 DAYS Performed at [facility], [place] [address] [place], [place] [address]    Report Status 03/24/2019 FINAL  Final  Fungus Culture With Stain     Status: None (Preliminary result)   Collection Time: 03/20/19 12:40 PM   Specimen: PATH Cytology CSF; Cerebrospinal Fluid  Result Value Ref Range Status   Fungus Stain Final report  Final    Comment: (NOTE) Performed At: [facility] [address] [place], [place] [id number] Rush Farmer MD [id number]    Fungus (Mycology) Culture PENDING  Incomplete   Fungal Source  CSF  Final    Comment: Performed at [facility], [address] [place], [place] [address]  Fungus Culture Result     Status: None   Collection Time: 03/20/19 12:40 PM  Result Value Ref Range Status   Result 1 Comment  Final    Comment: (NOTE) KOH/Calcofluor preparation:  no fungus observed. Performed  At: [facility] [place], [place] [id number] Rush Farmer MD [id number]      Radiology Studies: No results found.  Scheduled Meds: . acidophilus  2 capsule Oral BID  . alteplase  2 mg Intracatheter Once  . alteplase  2 mg Intracatheter Once  . apixaban  5 mg Oral BID  . aspirin  81 mg Per Tube Daily  . B-complex with vitamin C  1 tablet Per Tube Daily  . Chlorhexidine Gluconate Cloth  6 each Topical Q0600  . epoetin (EPOGEN/PROCRIT) injection  10,000 Units Intravenous Q M,W,F-HD  . feeding supplement (PRO-STAT SUGAR FREE 64)  30 mL Per Tube Daily  . ferrous gluconate  324 mg Oral Daily  . fluticasone  2 spray Each Nare Daily  . free water  20 mL Per Tube Q4H  . insulin aspart  0-15 Units Subcutaneous Q4H  . insulin glargine  10 Units Subcutaneous Daily  . levothyroxine  100 mcg Per Tube QAC breakfast  . mouth rinse  15 mL Mouth Rinse BID  . pantoprazole (PROTONIX) IV  40 mg Intravenous Q12H  . psyllium  1 packet Per Tube TID  . zinc sulfate  220 mg Per Tube Daily   Continuous Infusions: . sodium chloride 20 mL (03/23/19 1852)  . feeding supplement (OSMOLITE 1.5 CAL) 1,000 mL (03/25/19 0443)  . vancomycin Stopped (03/25/19 1734)     LOS: 13 days    Enzo Bi, MD [facility]   If 7PM-7AM, please contact night-coverage

## 2019-03-29 NOTE — Progress Notes (Signed)
Order received from Dr Billie Ruddy to remove staples. Staples removed and steri strips applied

## 2019-03-29 NOTE — Progress Notes (Signed)
When checking blood glucose pt states "ow" , I said your fingers are getting sore and she said "yes".

## 2019-03-30 DIAGNOSIS — R195 Other fecal abnormalities: Secondary | ICD-10-CM

## 2019-03-30 DIAGNOSIS — I499 Cardiac arrhythmia, unspecified: Secondary | ICD-10-CM

## 2019-03-30 LAB — RENAL FUNCTION PANEL
Albumin: 1.9 g/dL — ABNORMAL LOW (ref 3.5–5.0)
Anion gap: 11 (ref 5–15)
BUN: 75 mg/dL — ABNORMAL HIGH (ref 8–23)
CO2: 28 mmol/L (ref 22–32)
Calcium: 8.9 mg/dL (ref 8.9–10.3)
Chloride: 97 mmol/L — ABNORMAL LOW (ref 98–111)
Creatinine, Ser: 3.65 mg/dL — ABNORMAL HIGH (ref 0.44–1.00)
GFR calc Af Amer: 14 mL/min — ABNORMAL LOW (ref >60)
GFR calc non Af Amer: 12 mL/min — ABNORMAL LOW (ref >60)
Glucose, Bld: 173 mg/dL — ABNORMAL HIGH (ref 70–99)
Phosphorus: 4.9 mg/dL — ABNORMAL HIGH (ref 2.5–4.6)
Potassium: 5.5 mmol/L — ABNORMAL HIGH (ref 3.5–5.1)
Sodium: 136 mmol/L (ref 135–145)

## 2019-03-30 LAB — GLUCOSE, CAPILLARY
Glucose-Capillary: 122 mg/dL — ABNORMAL HIGH (ref 70–99)
Glucose-Capillary: 173 mg/dL — ABNORMAL HIGH (ref 70–99)
Glucose-Capillary: 121 mg/dL — ABNORMAL HIGH (ref 70–99)
Glucose-Capillary: 178 mg/dL — ABNORMAL HIGH (ref 70–99)

## 2019-03-30 LAB — CBC
HCT: 25.2 % — ABNORMAL LOW (ref 36.0–46.0)
Hemoglobin: 7.8 g/dL — ABNORMAL LOW (ref 12.0–15.0)
MCH: 31.2 pg (ref 26.0–34.0)
MCHC: 31 g/dL (ref 30.0–36.0)
MCV: 100.8 fL — ABNORMAL HIGH (ref 80.0–100.0)
Platelets: 264 10*3/uL (ref 150–400)
RBC: 2.5 MIL/uL — ABNORMAL LOW (ref 3.87–5.11)
RDW: 18.7 % — ABNORMAL HIGH (ref 11.5–15.5)
WBC: 6.1 10*3/uL (ref 4.0–10.5)
nRBC: 0 % (ref 0.0–0.2)

## 2019-03-30 MED ORDER — CALCIUM POLYCARBOPHIL 625 MG PO TABS
625.0000 mg | ORAL_TABLET | Freq: Two times a day (BID) | ORAL | Status: DC
  Administered 2019-03-30 – 2019-04-05 (×12): 625 mg via ORAL
  Filled 2019-03-30 (×14): qty 1

## 2019-03-30 MED ORDER — PANTOPRAZOLE SODIUM 40 MG PO PACK
40.0000 mg | PACK | Freq: Two times a day (BID) | ORAL | Status: DC
  Administered 2019-03-30 – 2019-04-09 (×20): 40 mg
  Filled 2019-03-30 (×23): qty 20

## 2019-03-30 NOTE — Progress Notes (Signed)
PROGRESS NOTE    [name]  [id number] DOB: [birth year] DOA: 02/09/2019 PCP: [name], MD   Brief Narrative:  Jamie Arias is a 64 y.o. white female with end-stage renal disease on hemodialysis, atrial fibrillation, chronic diastolic congestive heart failure, coronary artery disease status post CABG, diabetes mellitus type II, hypertension, hyperlipidemia, hypothyroidism, Covid positive in July 2020, recent nursing home stay in [place]. Prolonged hospitalization for MRSA bacteremia/sepsis and now with acute encephalopathy.  Subjective: Pt seen in dialysis center.  Dialysis nurse said pt said 2 words.  Not answering any questions or following any commands.  No noted fever, N/V.   Assessment & Plan:   Principal Problem:   Sepsis due to methicillin resistant Staphylococcus aureus (MRSA) ([place]) Active Problems:   Type 2 diabetes mellitus with ESRD (end-stage renal disease) (HCC)   Atrial fibrillation, chronic (HCC)   Hypotension   Chronic diastolic CHF (congestive heart failure) (HCC)   ESRD (end stage renal disease) (HCC)   Altered level of consciousness   Decubitus ulcer of heel, bilateral, stage 2 (HCC)   MRSA bacteremia   Arm DVT (deep venous thromboembolism), acute, left (HCC)   Acute encephalopathy   Acute metabolic encephalopathy   Acute blood loss anemia   AF (paroxysmal atrial fibrillation) ([place])   Diarrhea   Oropharyngeal dysphagia   Abscess  Sepsis secondary to MRSA bacteremia.  No obvious source.  Right arm graft was removed on 03/04/2019. -Continue with vancomycin for 6-week, will finish her course on 04/01/2019.      Acute metabolic encephalopathy:   mental status remains mostly unchanged -  MRI of brain and EEG negative. Patient remained afebrile with no leukocytosis.  - B12 within normal range  -  She was started on thiamine challenge by neurology but not much improvement. - IR  performed LP on 03/20/19 AM: CSF shows only 8 WBC, Protein  elevated at 73, Glucose 83. Negative for Toxo. CSF Cx final -- negative. Anaerobic and fungal Cx pending   -CSF/ other la, ID, Neurology assistance   - Repeat TSH was elevated. - Increased dose of Synthroid to 100 mcg -Overall prognosis seems poor.  -Palliative care consulted.  They have been following the patient and in touch with husband.  In the meantime continue medical treatment.  Bleeding from catheter site with multiple ecchymosis.  No new episode over the past 24 to 48 hours -  warfarin was discontinued due to concern for necrosis of skin.  Hemoglobin dropped to 6.8; improved to 8.7 and remains mostly above 8 -continue eliquis  Vaginal discharge, resolved There was some nursing concern of vaginal discharge  -there is some yeast but Gyn feels this may be colonization - may not need treatment. She is not symptomatic -Since culture grew E. coli and Klebsiella; contamination questionable  - Unasyn course completed on 03/23/19 (4 doses)  Anemia of chronic illness.   Status post transfusion 1 unit of PRBC on 03/17/2019 -  hemoglobin stable above 8.  Iron studies consistent with anemia of chronic illness.  MCV elevated.  -B12 within normal range -Continue EPO with dialysis.  Hypotension, chronic  consider restarting midodrine if BP drops again Also getting IV albumin intermittently during dialysis --consider adding more protein in tube feed, will discuss with nutrition tomorrow  ESRD (MWF).   Currently using tunneled catheter as graft was removed due to MRSA bacteremia. -Continue with scheduled dialysis per nephrology  Left arm DVT.  Currently on Eliquis - recent diagnosis of  left arm DVT on 03/03/2019. -Discontinue Coumadin due to concern for Coumadin toxicity/side effect due to her skin necrotic lesions.  Hypothyroidism.  Repeat TSH elevated at 17.82. -continue Increased dose of Synthroid 100. - repeat TSH in 2 to 3 weeks.  Paroxysmal A. Fib.. Currently in sinus. Amiodarone was  discontinued. -Continue Eliquis  Type 2 diabetes. -Continue SSI.  Chronic diastolic heart failure.  Volume being managed by dialysis.  Bilateral decubitus ulcers.  Present on admission. -Continue with wound care.  Nutrition.  PEG placed 03/03/2019.  Continue tube feeding.  Acute hypoxic respiratory failure.  Resolved.  Patient was intubated initially, extubated on 03/05/2019. -On room air now  Wound care nurse to follow sacral wound; consulted again on 03/24/19   Watery output per rectal tube --No fever, leukocytosis to suggest infection, per ID.  Likely due to tube feed plus free water. --Rectal tube fell out night of 1/23 PLAN: --continue metamucil --add fibercon today   DVT prophylaxis: Eliquis Code Status: DNR Family Communication:not today Disposition Plan: Waiting for bed offer at [facility]. Considered SNF, [place], pt currently not able to participate in PT, so PT can not rec SNF rehab.     Objective: Vitals:   03/30/19 1415 03/30/19 1429 03/30/19 1510 03/30/19 2011  BP: 98/60 110/74 (!) 109/41 (!) 117/47  Pulse: 76 78 78 80  Resp: 14 16 18 18   Temp: 98.4 F (36.9 C)  98 F (36.7 C) 98.7 F (37.1 C)  TempSrc: Axillary  Axillary Oral  SpO2:   100% 97%  Weight:      Height:        Intake/Output Summary (Last 24 hours) at 03/30/2019 2051 Last data filed at 03/30/2019 1415 Gross per 24 hour  Intake --  Output -50 ml  Net 50 ml   Filed Weights   03/25/19 1400 03/27/19 0725 03/27/19 1045  Weight: 81.7 kg 82.2 kg 81 kg    Examination:  Constitutional: NAD, awake, receiving cleaning HEENT: conjunctivae and lids normal, EOMI CV: RRR 2+ systolic murmur at upper sternal boarders. Distal pulses +2.  No cyanosis.   RESP: CTA B/L over anterior, normal respiratory effort, on RA GI: +BS, NTND, PEG tube in place Extremities: No effusions, edema, or tenderness in BLE SKIN: warm, dry, extensive bruising   Consultants:   Nephrology  Infectious  disease  Vascular surgery  Neurology  Critical care specialist   Procedures:  Antimicrobials:  Vancomycin.  Data Reviewed: I have personally reviewed following labs and imaging studies  CBC: Recent Labs  Lab 03/25/19 1400 03/26/19 0755 03/28/19 0637 03/29/19 0348 03/30/19 0632  WBC  --   --  6.4 6.1 6.1  HGB 7.9* 8.5* 7.9* 7.9* 7.8*  HCT 25.6* 28.0* 26.1* 25.9* 25.2*  MCV  --   --  102.8* 102.0* 100.8*  PLT  --   --  282 289 [id number]   Basic Metabolic Panel: Recent Labs  Lab 03/26/19 0755 03/27/19 2010 03/28/19 0637 03/29/19 0348 03/30/19 0632  NA 142 136 138 134* 136  K 4.1 5.0 4.4 5.1 5.5*  CL 100 100 100 97* 97*  CO2 32 28 29 27 28   GLUCOSE 207* 184* 180* 204* 173*  BUN 30* 27* 36* 53* 75*  CREATININE 2.33* 1.91* 2.15* 2.76* 3.65*  CALCIUM 8.4* 8.3* 8.7* 8.7* 8.9  PHOS 3.3 2.6 3.0 3.8 4.9*   GFR: Estimated Creatinine Clearance: 15.8 mL/min (A) (by C-G formula based on SCr of 3.65 mg/dL (H)). Liver Function Tests: Recent Labs  Lab 03/26/19  [id number] 03/27/19 2010 03/28/19 0637 03/29/19 0348 03/30/19 [id number]  ALBUMIN 1.9* 1.8* 1.8* 2.0* 1.9*   No results for input(s): LIPASE, AMYLASE in the last 168 hours. No results for input(s): AMMONIA in the last 168 hours. Coagulation Profile: No results for input(s): INR, PROTIME in the last 168 hours. Cardiac Enzymes: No results for input(s): CKTOTAL, CKMB, CKMBINDEX, TROPONINI in the last 168 hours. BNP (last 3 results) No results for input(s): PROBNP in the last 8760 hours. HbA1C: No results for input(s): HGBA1C in the last 72 hours. CBG: Recent Labs  Lab 03/29/19 2341 03/30/19 0308 03/30/19 0741 03/30/19 1606 03/30/19 2004  GLUCAP 120* 122* 173* 121* 178*   Lipid Profile: No results for input(s): CHOL, HDL, LDLCALC, TRIG, CHOLHDL, LDLDIRECT in the last 72 hours. Thyroid Function Tests: No results for input(s): TSH, T4TOTAL, FREET4, T3FREE, THYROIDAB in the last 72 hours. Anemia Panel: No results for  input(s): VITAMINB12, FOLATE, FERRITIN, TIBC, IRON, RETICCTPCT in the last 72 hours. Sepsis Labs: No results for input(s): PROCALCITON, LATICACIDVEN in the last 168 hours.  No results found for this or any previous visit (from the past 240 hour(s)).   Radiology Studies: No results found.  Scheduled Meds: . acidophilus  2 capsule Oral BID  . alteplase  2 mg Intracatheter Once  . alteplase  2 mg Intracatheter Once  . apixaban  5 mg Oral BID  . aspirin  81 mg Per Tube Daily  . B-complex with vitamin C  1 tablet Per Tube Daily  . Chlorhexidine Gluconate Cloth  6 each Topical Q0600  . epoetin (EPOGEN/PROCRIT) injection  10,000 Units Intravenous Q M,W,F-HD  . feeding supplement (PRO-STAT SUGAR FREE 64)  30 mL Per Tube Daily  . ferrous gluconate  324 mg Oral Daily  . fluticasone  2 spray Each Nare Daily  . free water  20 mL Per Tube Q4H  . insulin aspart  0-15 Units Subcutaneous Q4H  . insulin glargine  10 Units Subcutaneous Daily  . levothyroxine  100 mcg Per Tube QAC breakfast  . mouth rinse  15 mL Mouth Rinse BID  . pantoprazole sodium  40 mg Per Tube BID  . polycarbophil  625 mg Oral BID  . psyllium  1 packet Per Tube TID   Continuous Infusions: . sodium chloride 20 mL (03/23/19 1852)  . feeding supplement (OSMOLITE 1.5 CAL) 1,000 mL (03/29/19 1738)  . vancomycin Stopped (03/30/19 1632)     LOS: 28 days    Enzo Bi, MD [facility]   If 7PM-7AM, please contact night-coverage

## 2019-03-30 NOTE — Progress Notes (Signed)
Hd started  

## 2019-03-30 NOTE — Progress Notes (Signed)
ID Opens eyes when I call her and she tracks me She said yes to her name   BP (!) 109/41 (BP Location: Left Leg)   Pulse 78   Temp 98 F (36.7 C) (Axillary)   Resp 18   Ht 5' 4.02" (1.626 m)   Wt 81 kg   SpO2 100%   BMI 30.64 kg/m    O/e Awake, non verbal for most part Tremor chin Chest b/l air has Hs irregular Abd soft Peg in place No tenderness Arms swollen and edematous Bruising and ecchymosis arms, chest   CBC Latest Ref Rng & Units 03/30/2019 03/29/2019 03/28/2019  WBC 4.0 - 10.5 K/uL 6.1 6.1 6.4  Hemoglobin 12.0 - 15.0 g/dL 7.8(L) 7.9(L) 7.9(L)  Hematocrit 36.0 - 46.0 % 25.2(L) 25.9(L) 26.1(L)  Platelets 150 - 400 K/uL 264 289 282    CMP Latest Ref Rng & Units 03/30/2019 03/29/2019 03/28/2019  Glucose 70 - 99 mg/dL 173(H) 204(H) 180(H)  BUN 8 - 23 mg/dL 75(H) 53(H) 36(H)  Creatinine 0.44 - 1.00 mg/dL 3.65(H) 2.76(H) 2.15(H)  Sodium 135 - 145 mmol/L 136 134(L) 138  Potassium 3.5 - 5.1 mmol/L 5.5(H) 5.1 4.4  Chloride 98 - 111 mmol/L 97(L) 97(L) 100  CO2 22 - 32 mmol/L 28 27 29   Calcium 8.9 - 10.3 mg/dL 8.9 8.7(L) 8.7(L)  Total Protein 6.5 - 8.1 g/dL - - -  Total Bilirubin 0.3 - 1.2 mg/dL - - -  Alkaline Phos 38 - 126 U/L - - -  AST 15 - 41 U/L - - -  ALT 0 - 44 U/L - - -    Micro  12/15 left heel wound MRSA 12/7, 12/9,12/13 BC -MRSA 12/16 BC NG   Impression  MRSA bacteremia- rt arm AV fistula with stent was considered to be the source and it was removed   completing 6 weeks of IV antibiotic on 04/02/19   Encephalopathy- unclear etiology- csf unrevealing- autoimmune encephalitis/encepalopathy panel sent on 03/20/19- pending  Watery stool because of peg feeds No other corroborating findings for cdiff No leucocytosis, no fever No need to test  ESRD - left Ij catheter - for dialysis Discussed the management with her nurse

## 2019-03-30 NOTE — Progress Notes (Signed)
Nutrition Follow-up  DOCUMENTATION CODES:   Obesity unspecified  INTERVENTION:   Continue Osmolite 1.5 '@50ml'$ /hr + prostat liquid protein 30 ml daily via tube, supplement provides 100 kcal, 15 grams protein.  Free water flushes 71m q4 hours to maintain tube patency   Regimen provides 1900kcal/day, 90g/day protein, 10964mday free water.   B-complex with C daily via tube  Metamucil 1 packet TID via tube   Risaquad per tube BID  NUTRITION DIAGNOSIS:   Increased nutrient needs related to chronic illness(ESRD on HD) as evidenced by estimated needs. Ongoing.  GOAL:   Patient will meet greater than or equal to 90% of their needs -met with tube feeds   MONITOR:   Labs, Weight trends, TF tolerance, Skin, I & O's  ASSESSMENT:   [age].o. female with end-stage renal disease on HD, atrial fibrillation, chronic diastolic congestive heart failure, CAD status post PCI, CABG,  type 2 diabetes, hypertension, hyperlipidemia, hypothyroidism, Covid positive in July 2020 admitted with AMS and found to have MRSA positive blood cultures   Pt s/p PEG placement 12/29  Pt tolerating tube feeds well at goal rate via PEG. Diarrhea improved after addiition of probiotics. Pt continues to be somnolent and confused. Per chart, pt appears fairly weight stable; no new weight since 1/22.  Medications reviewed and include: risaquad, aspirin, B complex with C, epogen, ferrous gluconate, insulin, synthroid, protonix, psyllium, zinc, vancomycin  Labs reviewed: K 5.5(H), BUN 75(H), creat 3.65(H), P 4.9(H) Hgb 7.8(L), Hct 25.2(L) cbgs- 146, 169, 120, 122, 173 x 24 hrs  Diet Order:   Diet Order            Diet NPO time specified  Diet effective now             EDUCATION NEEDS:   Not appropriate for education at this time  Skin:  Skin Assessment: Skin Integrity Issues:(stg II right buttocks; DTI right heel; unstageable left heel)  Last BM:  1/25- type 6  Height:   Ht Readings from Last 1  Encounters:  03/04/19 5' 4.02" (1.626 m)   Weight:   Wt Readings from Last 1 Encounters:  03/27/19 81 kg   Ideal Body Weight:  45 kg  BMI:  Body mass index is 30.64 kg/m.  Estimated Nutritional Needs:   Kcal:  1700-1900kcal/day  Protein:  85-95g/day  Fluid:  UOP +1L  CaKoleen DistanceS, RD, LDN Pager #- [phone]#- [phone] Hours Pager: [phone]

## 2019-03-30 NOTE — TOC Progression Note (Signed)
Transition of Care [facility]) - Progression Note    Patient Details  Name: Jamie Arias MRN: [id number] Date of Birth: 04-05-1955  Transition of Care [facility]) CM/SW Contact  Shelbie Ammons, RN Phone Number: 03/30/2019, 3:49 PM  Clinical Narrative:   RNCM returned call to patient's husband Jamie Arias who called in and left message. Did discuss ongoing issues with possible placement of patient and discussed with husband that per LTAC representative she is beyond the window of having enough insurance days to be able to come there. Did discuss with husband that as she is becoming more alert if she is able to get up into recliner and sit there long enough for dialysis treatments then she may be eligible for SNF placement which would provide more possibilities for her placement.     Expected Discharge Plan: [place] Barriers to Discharge: Continued Medical Work up  Expected Discharge Plan and Services Expected Discharge Plan: [place] In-house Referral: Clinical Social Work     Living arrangements for the past 2 months: Mobile Home                                       Social Determinants of Health (SDOH) Interventions    Readmission Risk Interventions Readmission Risk Prevention Plan 02/13/2019 09/15/2018  Transportation Screening - Complete  Medication Review Press photographer) Complete Complete  PCP or Specialist appointment within 3-5 days of discharge - Complete  HRI or [place] Complete Complete  SW Recovery Care/Counseling Consult Complete Complete  [place] Not Applicable Complete  Some recent data might be hidden

## 2019-03-30 NOTE — Progress Notes (Signed)
PT Cancellation Note  Patient Details Name: VERBA DEGEN MRN: [id number] DOB: Mar 03, 1956   Cancelled Treatment:    Reason Eval/Treat Not Completed: Other (comment).  Pt currently off unit at dialysis.  Will re-attempt PT treatment session at a later date/time.   Raquel Sarna Giulia Hickey 03/30/2019, 12:13 PM

## 2019-03-30 NOTE — Progress Notes (Signed)
[place], [place] 03/30/19  Subjective:  Patient seen prior to dialysis. Awake this a.m. but again nonverbal. Remains on tube feeds.  Objective:  Vital signs in last 24 hours:  Temp:  [98 F (36.7 C)-98.7 F (37.1 C)] 98.7 F (37.1 C) (01/25 1045) Pulse Rate:  [70-77] 77 (01/25 1230) Resp:  [11-20] 14 (01/25 1230) BP: (80-155)/(33-56) 99/41 (01/25 1230) SpO2:  [98 %-100 %] 100 % (01/25 1040)  Weight change:  Filed Weights   03/25/19 1400 03/27/19 0725 03/27/19 1045  Weight: 81.7 kg 82.2 kg 81 kg    Intake/Output:   No intake or output data in the 24 hours ending 03/30/19 1242   Physical Exam: General:  Chronically ill-appearing, laying in the bed  HEENT  anicteric, moist oral mucous membranes  Pulm/lungs  shallow breathing effort, no crackles  CVS/Heart  irregular rhythm, no rub  Abdomen:   Soft, nontender, PEG tube in place  Extremities:  Bilateral upper extremity 2+ edema  Neurologic:  Awake but not conversant  Skin:  scattered Ecchymosis  Access:  PermCath Left IJ       Basic Metabolic Panel:  Recent Labs  Lab 03/26/19 0755 03/26/19 0755 03/27/19 2010 03/27/19 2010 03/28/19 0637 03/29/19 0348 03/30/19 0632  NA 142  --  136  --  138 134* 136  K 4.1  --  5.0  --  4.4 5.1 5.5*  CL 100  --  100  --  100 97* 97*  CO2 32  --  28  --  29 27 28   GLUCOSE 207*  --  184*  --  180* 204* 173*  BUN 30*  --  27*  --  36* 53* 75*  CREATININE 2.33*  --  1.91*  --  2.15* 2.76* 3.65*  CALCIUM 8.4*   < > 8.3*   < > 8.7* 8.7* 8.9  PHOS 3.3  --  2.6  --  3.0 3.8 4.9*   < > = values in this interval not displayed.     CBC: Recent Labs  Lab 03/25/19 1400 03/26/19 0755 03/28/19 0637 03/29/19 0348 03/30/19 0632  WBC  --   --  6.4 6.1 6.1  HGB 7.9* 8.5* 7.9* 7.9* 7.8*  HCT 25.6* 28.0* 26.1* 25.9* 25.2*  MCV  --   --  102.8* 102.0* 100.8*  PLT  --   --  282 289 264      Lab Results  Component Value Date   HEPBSAG NON REACTIVE  03/18/2019      Microbiology:  No results found for this or any previous visit (from the past 240 hour(s)).  Coagulation Studies: No results for input(s): LABPROT, INR in the last 72 hours.  Urinalysis: No results for input(s): COLORURINE, LABSPEC, PHURINE, GLUCOSEU, HGBUR, BILIRUBINUR, KETONESUR, PROTEINUR, UROBILINOGEN, NITRITE, LEUKOCYTESUR in the last 72 hours.  Invalid input(s): APPERANCEUR    Imaging: No results found.   Medications:   . sodium chloride 20 mL (03/23/19 1852)  . feeding supplement (OSMOLITE 1.5 CAL) 1,000 mL (03/29/19 1738)  . vancomycin Stopped (03/25/19 1734)   . acidophilus  2 capsule Oral BID  . alteplase  2 mg Intracatheter Once  . alteplase  2 mg Intracatheter Once  . apixaban  5 mg Oral BID  . aspirin  81 mg Per Tube Daily  . B-complex with vitamin C  1 tablet Per Tube Daily  . Chlorhexidine Gluconate Cloth  6 each Topical Q0600  . epoetin (EPOGEN/PROCRIT) injection  10,000 Units Intravenous  Q M,W,F-HD  . feeding supplement (PRO-STAT SUGAR FREE 64)  30 mL Per Tube Daily  . ferrous gluconate  324 mg Oral Daily  . fluticasone  2 spray Each Nare Daily  . free water  20 mL Per Tube Q4H  . insulin aspart  0-15 Units Subcutaneous Q4H  . insulin glargine  10 Units Subcutaneous Daily  . levothyroxine  100 mcg Per Tube QAC breakfast  . mouth rinse  15 mL Mouth Rinse BID  . pantoprazole sodium  40 mg Per Tube BID  . polycarbophil  625 mg Oral BID  . psyllium  1 packet Per Tube TID   sodium chloride, acetaminophen, ipratropium-albuterol, ondansetron (ZOFRAN) IV, traMADol  Assessment/ Plan:  64 y.o. female with  end-stage renal disease on hemodialysis, atrial fibrillation, chronic diastolic congestive heart failure, coronary artery disease status post CABG, diabetes mellitus type II, hypertension, hyperlipidemia, hypothyroidism, Covid positive in July 2020, recent nursing home stay in [place], Prolonged hospitalization for MRSA bacteremia/sepsis   admitted on 02/09/2019 for ESRD (end stage renal disease) ([place]) [N18.6] Altered mental status, unspecified altered mental status type [R41.82] Chest pain, unspecified type [R07.9] Sepsis, due to unspecified organism, unspecified whether acute organ dysfunction present ([place]) [A41.9]  [facility]   #End-stage renal disease Patient due for hemodialysis today.  Orders have been prepared.  #Acute encephalopathy No significant change in mental status.  Remains awake but not conversant.  #MRSA sepsis Right arm AV graft removed this admission on March 04, 2019  #Chronic hypotension We will likely need to restart midodrine during dialysis treatments as blood pressure has a tendency to fluctuate during dialysis treatments.Marland Kitchen  #Anemia of chronic kidney disease Continue Epogen 10,000 IV with dialysis. Lab Results  Component Value Date   HGB 7.8 (L) 03/30/2019      LOS: 35 Adri Schloss 1/25/202112:42 PM  [place], [place]  Note: This note was prepared with Dragon dictation. Any transcription errors are unintentional

## 2019-03-30 NOTE — Progress Notes (Signed)
Notify Dr. Billie Ruddy patients BP is 109/41. No new orders.

## 2019-03-30 NOTE — Progress Notes (Signed)
Hd completed 

## 2019-03-31 LAB — GLUCOSE, CAPILLARY
Glucose-Capillary: 137 mg/dL — ABNORMAL HIGH (ref 70–99)
Glucose-Capillary: 162 mg/dL — ABNORMAL HIGH (ref 70–99)
Glucose-Capillary: 171 mg/dL — ABNORMAL HIGH (ref 70–99)
Glucose-Capillary: 179 mg/dL — ABNORMAL HIGH (ref 70–99)
Glucose-Capillary: 262 mg/dL — ABNORMAL HIGH (ref 70–99)
Glucose-Capillary: 91 mg/dL (ref 70–99)

## 2019-03-31 LAB — RENAL FUNCTION PANEL
Albumin: 2 g/dL — ABNORMAL LOW (ref 3.5–5.0)
Anion gap: 6 (ref 5–15)
BUN: 36 mg/dL — ABNORMAL HIGH (ref 8–23)
CO2: 33 mmol/L — ABNORMAL HIGH (ref 22–32)
Calcium: 8.5 mg/dL — ABNORMAL LOW (ref 8.9–10.3)
Chloride: 97 mmol/L — ABNORMAL LOW (ref 98–111)
Creatinine, Ser: 2.04 mg/dL — ABNORMAL HIGH (ref 0.44–1.00)
GFR calc Af Amer: 29 mL/min — ABNORMAL LOW (ref >60)
GFR calc non Af Amer: 25 mL/min — ABNORMAL LOW (ref >60)
Glucose, Bld: 169 mg/dL — ABNORMAL HIGH (ref 70–99)
Phosphorus: 3.4 mg/dL (ref 2.5–4.6)
Potassium: 4.9 mmol/L (ref 3.5–5.1)
Sodium: 136 mmol/L (ref 135–145)

## 2019-03-31 MED ORDER — MIDODRINE HCL 5 MG PO TABS
10.0000 mg | ORAL_TABLET | ORAL | Status: DC
  Administered 2019-04-01 – 2019-04-06 (×3): 10 mg via ORAL
  Filled 2019-03-31 (×5): qty 2

## 2019-03-31 NOTE — Progress Notes (Signed)
[place], [place] 03/31/19  Subjective:  Patient remains quite weak. Awake but nonverbal. Completed dialysis yesterday.  Objective:  Vital signs in last 24 hours:  Temp:  [97.5 F (36.4 C)-98.7 F (37.1 C)] 97.5 F (36.4 C) (01/26 0412) Pulse Rate:  [74-83] 83 (01/26 0412) Resp:  [11-18] 18 (01/26 0412) BP: (80-117)/(33-74) 114/42 (01/26 0412) SpO2:  [97 %-100 %] 97 % (01/26 0412)  Weight change:  Filed Weights   03/25/19 1400 03/27/19 0725 03/27/19 1045  Weight: 81.7 kg 82.2 kg 81 kg    Intake/Output:    Intake/Output Summary (Last 24 hours) at 03/31/2019 1047 Last data filed at 03/30/2019 1415 Gross per 24 hour  Intake --  Output -50 ml  Net 50 ml     Physical Exam: General:  Chronically ill-appearing, laying in the bed  HEENT  anicteric, moist oral mucous membranes  Pulm/lungs  clear bilateral, normal effort  CVS/Heart  irregular rhythm, no rub  Abdomen:   Soft, nontender, PEG tube in place  Extremities:  Bilateral upper extremity 1+ edema  Neurologic:  Awake but not conversant  Skin:  Bilateral upper extremity ecchymoses  Access:  PermCath Left IJ       Basic Metabolic Panel:  Recent Labs  Lab 03/27/19 2010 03/27/19 2010 03/28/19 0637 03/28/19 0637 03/29/19 0348 03/30/19 0632 03/31/19 0559  NA 136  --  138  --  134* 136 136  K 5.0  --  4.4  --  5.1 5.5* 4.9  CL 100  --  100  --  97* 97* 97*  CO2 28  --  29  --  27 28 33*  GLUCOSE 184*  --  180*  --  204* 173* 169*  BUN 27*  --  36*  --  53* 75* 36*  CREATININE 1.91*  --  2.15*  --  2.76* 3.65* 2.04*  CALCIUM 8.3*   < > 8.7*   < > 8.7* 8.9 8.5*  PHOS 2.6  --  3.0  --  3.8 4.9* 3.4   < > = values in this interval not displayed.     CBC: Recent Labs  Lab 03/25/19 1400 03/26/19 0755 03/28/19 0637 03/29/19 0348 03/30/19 0632  WBC  --   --  6.4 6.1 6.1  HGB 7.9* 8.5* 7.9* 7.9* 7.8*  HCT 25.6* 28.0* 26.1* 25.9* 25.2*  MCV  --   --  102.8* 102.0* 100.8*   PLT  --   --  282 289 264      Lab Results  Component Value Date   HEPBSAG NON REACTIVE 03/18/2019      Microbiology:  No results found for this or any previous visit (from the past 240 hour(s)).  Coagulation Studies: No results for input(s): LABPROT, INR in the last 72 hours.  Urinalysis: No results for input(s): COLORURINE, LABSPEC, PHURINE, GLUCOSEU, HGBUR, BILIRUBINUR, KETONESUR, PROTEINUR, UROBILINOGEN, NITRITE, LEUKOCYTESUR in the last 72 hours.  Invalid input(s): APPERANCEUR    Imaging: No results found.   Medications:   . sodium chloride 20 mL (03/23/19 1852)  . feeding supplement (OSMOLITE 1.5 CAL) 1,000 mL (03/29/19 1738)  . vancomycin Stopped (03/30/19 1632)   . acidophilus  2 capsule Oral BID  . alteplase  2 mg Intracatheter Once  . alteplase  2 mg Intracatheter Once  . apixaban  5 mg Oral BID  . aspirin  81 mg Per Tube Daily  . B-complex with vitamin C  1 tablet Per Tube Daily  . Chlorhexidine  Gluconate Cloth  6 each Topical Q0600  . epoetin (EPOGEN/PROCRIT) injection  10,000 Units Intravenous Q M,W,F-HD  . feeding supplement (PRO-STAT SUGAR FREE 64)  30 mL Per Tube Daily  . ferrous gluconate  324 mg Oral Daily  . fluticasone  2 spray Each Nare Daily  . free water  20 mL Per Tube Q4H  . insulin aspart  0-15 Units Subcutaneous Q4H  . insulin glargine  10 Units Subcutaneous Daily  . levothyroxine  100 mcg Per Tube QAC breakfast  . mouth rinse  15 mL Mouth Rinse BID  . pantoprazole sodium  40 mg Per Tube BID  . polycarbophil  625 mg Oral BID  . psyllium  1 packet Per Tube TID   sodium chloride, acetaminophen, ipratropium-albuterol, ondansetron (ZOFRAN) IV, traMADol  Assessment/ Plan:  64 y.o. female with  end-stage renal disease on hemodialysis, atrial fibrillation, chronic diastolic congestive heart failure, coronary artery disease status post CABG, diabetes mellitus type II, hypertension, hyperlipidemia, hypothyroidism, Covid positive in July  2020, recent nursing home stay in [place], Prolonged hospitalization for MRSA bacteremia/sepsis  admitted on 02/09/2019 for ESRD (end stage renal disease) ([place]) [N18.6] Altered mental status, unspecified altered mental status type [R41.82] Chest pain, unspecified type [R07.9] Sepsis, due to unspecified organism, unspecified whether acute organ dysfunction present ([place]) [A41.9]  [facility] [place]   #End-stage renal disease Patient completed dialysis yesterday.  No acute indication for dialysis treatment today.  We will plan for dialysis again tomorrow.  #Acute encephalopathy The patient's mental status remained stable.  She is awake but nonverbal.  #MRSA sepsis Right arm AV graft removed this admission on March 04, 2019  #Chronic hypotension Blood pressure did drop a bit yesterday.  We will prescribe midodrine 10 mg prior to dialysis treatments.  #Anemia of chronic kidney disease Hemoglobin remains a bit low at 7.8.  Maintain the patient Epogen 10,000 units IV with dialysis treatments.  Iron levels were checked last week. Lab Results  Component Value Date   HGB 7.8 (L) 03/30/2019      LOS: 33 Dietrich Ke 1/26/202110:47 AM  [place], [place]  Note: This note was prepared with Dragon dictation. Any transcription errors are unintentional

## 2019-03-31 NOTE — Progress Notes (Signed)
PROGRESS NOTE    Jamie Arias  [id number] DOB: 1956-02-26 DOA: 02/09/2019 PCP: [name], MD   Brief Narrative:  [name] is a 64 y.o. white female with end-stage renal disease on hemodialysis, atrial fibrillation, chronic diastolic congestive heart failure, coronary artery disease status post CABG, diabetes mellitus type II, hypertension, hyperlipidemia, hypothyroidism, Covid positive in July 2020, recent nursing home stay in [place]. Prolonged hospitalization for MRSA bacteremia/sepsis and now with acute encephalopathy.  Subjective: Nursing and PT got pt up in a chair, and pt sat in a chair for ~4 hours, next to her husband, in no distress.  Said a few more words, but mostly reactive.  No noted fever, N/V or increased swelling.  Pt's stools noted to be formed now.     Assessment & Plan:   Principal Problem:   Sepsis due to methicillin resistant Staphylococcus aureus (MRSA) ([place]) Active Problems:   Type 2 diabetes mellitus with ESRD (end-stage renal disease) (HCC)   Atrial fibrillation, chronic (HCC)   Hypotension   Chronic diastolic CHF (congestive heart failure) (HCC)   ESRD (end stage renal disease) (HCC)   Altered level of consciousness   Decubitus ulcer of heel, bilateral, stage 2 (HCC)   MRSA bacteremia   Arm DVT (deep venous thromboembolism), acute, left (HCC)   Acute encephalopathy   Acute metabolic encephalopathy   Acute blood loss anemia   AF (paroxysmal atrial fibrillation) ([place])   Diarrhea   Oropharyngeal dysphagia   Abscess  Sepsis secondary to MRSA bacteremia.   No obvious source.  Right arm graft was removed on 03/04/2019. -Continue with vancomycin for 6-week, will finish her course on 04/01/2019.  ID following.  Acute metabolic encephalopathy:   mental status remains mostly unchanged, still non-verbal, not moving her body herself. -  MRI of brain and EEG negative. Patient remained afebrile with no leukocytosis.  - B12 within normal  range  -  She was started on thiamine challenge by neurology but not much improvement. - IR  performed LP on 03/20/19 AM: CSF shows only 8 WBC, Protein elevated at 73, Glucose 83. Negative for Toxo. CSF Cx final -- negative. Anaerobic and fungal Cx pending   -CSF/ other la, ID, Neurology assistance   - Repeat TSH was elevated. - Increased dose of Synthroid to 100 mcg -Overall prognosis seems poor.  -Palliative care consulted.  They have been following the patient and in touch with husband.  In the meantime continue medical treatment.  Bleeding from catheter site with multiple ecchymosis.  No new episode over the past 24 to 48 hours -  warfarin was discontinued due to concern for necrosis of skin.  Hemoglobin dropped to 6.8; improved to 8.7 and remains mostly above 8 -continue eliquis  Vaginal discharge, resolved There was some nursing concern of vaginal discharge  -there is some yeast but Gyn feels this may be colonization - may not need treatment. She is not symptomatic -Since culture grew E. coli and Klebsiella; contamination questionable  - Unasyn course completed on 03/23/19 (4 doses)  Anemia of chronic illness.   Status post transfusion 1 unit of PRBC on 03/17/2019 -  hemoglobin stable above 8.  Iron studies consistent with anemia of chronic illness.  MCV elevated.  -B12 within normal range -Continue EPO with dialysis.  Hypotension, chronic  Hypoalbuminemia  consider restarting midodrine if BP drops again Also getting IV albumin intermittently during dialysis --consider adding more protein in tube feed, message send to dietician  ESRD on HD (MWF).   Currently using tunneled catheter as graft was removed due to MRSA bacteremia. -Continue with scheduled dialysis per nephrology  Left arm DVT.  Currently on Eliquis - recent diagnosis of left arm DVT on 03/03/2019. -Discontinue Coumadin due to concern for Coumadin toxicity/side effect due to her skin necrotic  lesions.  Hypothyroidism.  Repeat TSH elevated at 17.82. -continue Increased dose of Synthroid 100. - repeat TSH in 2 to 3 weeks.  Paroxysmal A. Fib.. Currently in sinus. Amiodarone was discontinued. -Continue Eliquis  Type 2 diabetes. -Continue SSI.  Chronic diastolic heart failure.  Volume being managed by dialysis.  Bilateral decubitus ulcers.  Present on admission. -Continue with wound care.  Nutrition.   PEG placed 03/03/2019.  Continue tube feeding.  Acute hypoxic respiratory failure.  Resolved.  Patient was intubated initially, extubated on 03/05/2019. -On room air now  Wound care nurse to follow sacral wound; consulted again on 03/24/19   Watery output per rectal tube, resolved --No fever, leukocytosis to suggest infection, per ID.  Likely due to tube feed plus free water. --Rectal tube fell out night of 1/23, and was left off.  Stools have become more formed since then. PLAN: --continue metamucil and fibercon    DVT prophylaxis: Eliquis Code Status: DNR Family Communication: updated husband in pt's room Disposition Plan: Was notified by SW that pt is no longer eligible for LTAC due to insurance reasons.  Will have to pursue short-term SNF, since pt doesn't have [employer] yet (though husband had applied).  Pt currently not able to participate in PT, so PT can not rec SNF rehab.     Objective: Vitals:   03/30/19 1510 03/30/19 2011 03/31/19 0412 03/31/19 1200  BP: (!) 109/41 (!) 117/47 (!) 114/42 (!) 122/52  Pulse: 78 80 83 79  Resp: 18 18 18 14   Temp: 98 F (36.7 C) 98.7 F (37.1 C) (!) 97.5 F (36.4 C) 98.1 F (36.7 C)  TempSrc: Axillary Oral Oral Oral  SpO2: 100% 97% 97% 98%  Weight:      Height:       No intake or output data in the 24 hours ending 03/31/19 1426 Filed Weights   03/25/19 1400 03/27/19 0725 03/27/19 1045  Weight: 81.7 kg 82.2 kg 81 kg    Examination:  Constitutional: NAD, awake, more expression in her face today, sitting next to  her husband in a chair HEENT: conjunctivae and lids normal, EOMI CV: RRR 2+ systolic murmur at upper sternal boarders. Distal pulses +2.  No cyanosis.   RESP: CTA B/L over anterior, normal respiratory effort, on RA GI: +BS, NTND, PEG tube in place Extremities: No effusions, edema, or tenderness in BLE SKIN: warm, dry, extensive bruising   Consultants:   Nephrology  Infectious disease  Vascular surgery  Neurology  Critical care specialist  Dietician    Procedures:  Antimicrobials:  Vancomycin.  Data Reviewed: I have personally reviewed following labs and imaging studies  CBC: Recent Labs  Lab 03/25/19 1400 03/26/19 0755 03/28/19 0637 03/29/19 0348 03/30/19 0632  WBC  --   --  6.4 6.1 6.1  HGB 7.9* 8.5* 7.9* 7.9* 7.8*  HCT 25.6* 28.0* 26.1* 25.9* 25.2*  MCV  --   --  102.8* 102.0* 100.8*  PLT  --   --  282 289 [id number]   Basic Metabolic Panel: Recent Labs  Lab 03/27/19 2010 03/28/19 0637 03/29/19 0348 03/30/19 0632 03/31/19 0559  NA 136 138 134* 136 136  K 5.0 4.4  5.1 5.5* 4.9  CL 100 100 97* 97* 97*  CO2 28 29 27 28  33*  GLUCOSE 184* 180* 204* 173* 169*  BUN 27* 36* 53* 75* 36*  CREATININE 1.91* 2.15* 2.76* 3.65* 2.04*  CALCIUM 8.3* 8.7* 8.7* 8.9 8.5*  PHOS 2.6 3.0 3.8 4.9* 3.4   GFR: Estimated Creatinine Clearance: 28.3 mL/min (A) (by C-G formula based on SCr of 2.04 mg/dL (H)). Liver Function Tests: Recent Labs  Lab 03/27/19 2010 03/28/19 [id number] 03/29/19 0348 03/30/19 0632 03/31/19 0559  ALBUMIN 1.8* 1.8* 2.0* 1.9* 2.0*   No results for input(s): LIPASE, AMYLASE in the last 168 hours. No results for input(s): AMMONIA in the last 168 hours. Coagulation Profile: No results for input(s): INR, PROTIME in the last 168 hours. Cardiac Enzymes: No results for input(s): CKTOTAL, CKMB, CKMBINDEX, TROPONINI in the last 168 hours. BNP (last 3 results) No results for input(s): PROBNP in the last 8760 hours. HbA1C: No results for input(s): HGBA1C in the  last 72 hours. CBG: Recent Labs  Lab 03/30/19 2004 03/31/19 0018 03/31/19 0409 03/31/19 0801 03/31/19 1157  GLUCAP 178* 262* 179* 171* 162*   Lipid Profile: No results for input(s): CHOL, HDL, LDLCALC, TRIG, CHOLHDL, LDLDIRECT in the last 72 hours. Thyroid Function Tests: No results for input(s): TSH, T4TOTAL, FREET4, T3FREE, THYROIDAB in the last 72 hours. Anemia Panel: No results for input(s): VITAMINB12, FOLATE, FERRITIN, TIBC, IRON, RETICCTPCT in the last 72 hours. Sepsis Labs: No results for input(s): PROCALCITON, LATICACIDVEN in the last 168 hours.  No results found for this or any previous visit (from the past 240 hour(s)).   Radiology Studies: No results found.  Scheduled Meds:  acidophilus  2 capsule Oral BID   alteplase  2 mg Intracatheter Once   alteplase  2 mg Intracatheter Once   apixaban  5 mg Oral BID   aspirin  81 mg Per Tube Daily   B-complex with vitamin C  1 tablet Per Tube Daily   Chlorhexidine Gluconate Cloth  6 each Topical Q0600   epoetin (EPOGEN/PROCRIT) injection  10,000 Units Intravenous Q M,W,F-HD   feeding supplement (PRO-STAT SUGAR FREE 64)  30 mL Per Tube Daily   ferrous gluconate  324 mg Oral Daily   fluticasone  2 spray Each Nare Daily   free water  20 mL Per Tube Q4H   insulin aspart  0-15 Units Subcutaneous Q4H   insulin glargine  10 Units Subcutaneous Daily   levothyroxine  100 mcg Per Tube QAC breakfast   mouth rinse  15 mL Mouth Rinse BID   [START ON 04/01/2019] midodrine  10 mg Oral Q M,W,F-HD   pantoprazole sodium  40 mg Per Tube BID   polycarbophil  625 mg Oral BID   psyllium  1 packet Per Tube TID   Continuous Infusions:  sodium chloride 20 mL (03/23/19 1852)   feeding supplement (OSMOLITE 1.5 CAL) 1,000 mL (03/29/19 1738)   vancomycin Stopped (03/30/19 1632)     LOS: 50 days    Enzo Bi, MD [facility]   If 7PM-7AM, please contact night-coverage

## 2019-03-31 NOTE — Progress Notes (Signed)
Patient was transferred from bed to chair using hoyer lift. Patient sat in the chair from 1130 to 1630 and showed no signs of pain or distress during or after.  Marry Guan, RN 03/31/2019 5:27 PM

## 2019-03-31 NOTE — Progress Notes (Signed)
   03/31/19 1600  Clinical Encounter Type  Visited With Patient and family together  Visit Type Initial  Referral From Chaplain  Consult/Referral To Chaplain  While rounding Chaplain visited with patient, who is unresponsive and husband. Chaplain offered pastoral presence, empathy, and prayer.

## 2019-03-31 NOTE — Progress Notes (Signed)
Physical Therapy Treatment Patient Details Name: Jamie Arias MRN: [id number] DOB: 08-19-1955 Today's Date: 03/31/2019    History of Present Illness Pt is a 64 y.o. female presenting to hospital 02/09/19 with AMS.  Pt admitted with MRSA sepsis and bacteremia with hypotension, toxic metabolic encephalopathy, ESRD on HD, hypokalemia, elevated troponin, a-fib, and anemia of CKD.  LUE DVT diagnosed 03/03/19.  Of note pt had been intubated but was extubated 03/04/20.  PMH includes ESRD on HD, a-fib, chronic diastolic CHF, CAD s/p CABG, DM, htn, COVID (+) July 2020.    PT Comments    Patient awake, but minimally interactive with therapist, husband or external environment throughout session.  Opens eyes at times, but does not visually identify/target or track people in room.  Unable to actively assist with movement attempts this date; requiring dep +2 transfer (via mechanical hoyer lift) for bed/chair. Tolerated movement transition without difficulty or adverse response, but does not actively participate with or respond to transitional movement.  Husband present in room throughout session to observe transfer; educated on signs/symptoms of discomfort or intolerance to sitting position (grimacing, restlessness, agitation), and advised to notify nursing if any signs/symptoms observed.  Husband voiced understanding. Nursing to monitor patient tolerance and total time OOB to chair; to return to bed via hoyer lift as appropriate. Will trial additional 1-2 sessions with continued attention to ability to actively participate and progress with skilled intervention and adjust POC appropriately based on response.     Follow Up Recommendations  SNF(per ability to demonstrate active participation and/or progress over next 1-2 sessions in trial)     Equipment Recommendations  Hospital bed(hoyer lift)    Recommendations for Other Services       Precautions / Restrictions Precautions Precautions:  Fall Precaution Comments: L IJ HD permcath; NPO; PEG tube; aspiration precautions; rectal tube; elevate R UE Restrictions Weight Bearing Restrictions: No    Mobility  Bed Mobility Overal bed mobility: Needs Assistance Bed Mobility: Supine to Sit     Supine to sit: Total assist;+2 for physical assistance     General bed mobility comments: dep via hoyer lift  Transfers Overall transfer level: Needs assistance                  Ambulation/Gait             General Gait Details: unsafe/unable   Stairs             Wheelchair Mobility    Modified Rankin (Stroke Patients Only)       Balance                                            Cognition   Behavior During Therapy: Flat affect Overall Cognitive Status: Impaired/Different from baseline                                 General Comments: minimally responsive and non-interactive with external environment; no attempts to follow commands or actively participate with session.  Does open eyes, but does not visually acknowledge, track therapist or husband in room      Exercises Other Exercises Other Exercises: Patient with noted torticollis; head resting in position of L lateral flexion/rotation.  Unable to reposition to neutral due to cervical contractures.    General Comments  Pertinent Vitals/Pain Pain Assessment: Faces Faces Pain Scale: No hurt    Home Living                      Prior Function            PT Goals (current goals can now be found in the care plan section) Acute Rehab PT Goals PT Goal Formulation: Patient unable to participate in goal setting Time For Goal Achievement: 04/11/19 Potential to Achieve Goals: Poor Progress towards PT goals: Not progressing toward goals - comment(unable to actively participate with session)    Frequency    Min 2X/week      PT Plan Current plan remains appropriate    Co-evaluation               AM-PAC PT "6 Clicks" Mobility   Outcome Measure  Help needed turning from your back to your side while in a flat bed without using bedrails?: Total Help needed moving from lying on your back to sitting on the side of a flat bed without using bedrails?: Total Help needed moving to and from a bed to a chair (including a wheelchair)?: Total Help needed standing up from a chair using your arms (e.g., wheelchair or bedside chair)?: Total Help needed to walk in hospital room?: Total Help needed climbing 3-5 steps with a railing? : Total 6 Click Score: 6    End of Session   Activity Tolerance: (limited by cognitive status) Patient left: in chair;with call bell/phone within reach;with chair alarm set;with family/visitor present Nurse Communication: Mobility status;Precautions PT Visit Diagnosis: Other abnormalities of gait and mobility (R26.89);Muscle weakness (generalized) (M62.81);Difficulty in walking, not elsewhere classified (R26.2)     Time: [id number] PT Time Calculation (min) (ACUTE ONLY): 17 min  Charges:  $Therapeutic Activity: 8-22 mins                     Armaan Pond H. Owens Shark, PT, DPT, NCS 03/31/19, 10:57 PM [phone]

## 2019-04-01 LAB — GLUCOSE, CAPILLARY
Glucose-Capillary: 127 mg/dL — ABNORMAL HIGH (ref 70–99)
Glucose-Capillary: 131 mg/dL — ABNORMAL HIGH (ref 70–99)
Glucose-Capillary: 132 mg/dL — ABNORMAL HIGH (ref 70–99)
Glucose-Capillary: 134 mg/dL — ABNORMAL HIGH (ref 70–99)
Glucose-Capillary: 135 mg/dL — ABNORMAL HIGH (ref 70–99)
Glucose-Capillary: 142 mg/dL — ABNORMAL HIGH (ref 70–99)
Glucose-Capillary: 160 mg/dL — ABNORMAL HIGH (ref 70–99)
Glucose-Capillary: 64 mg/dL — ABNORMAL LOW (ref 70–99)
Glucose-Capillary: 97 mg/dL (ref 70–99)

## 2019-04-01 LAB — RENAL FUNCTION PANEL
Albumin: 1.9 g/dL — ABNORMAL LOW (ref 3.5–5.0)
Anion gap: 11 (ref 5–15)
BUN: 42 mg/dL — ABNORMAL HIGH (ref 8–23)
CO2: 28 mmol/L (ref 22–32)
Calcium: 8.3 mg/dL — ABNORMAL LOW (ref 8.9–10.3)
Chloride: 96 mmol/L — ABNORMAL LOW (ref 98–111)
Creatinine, Ser: 2.3 mg/dL — ABNORMAL HIGH (ref 0.44–1.00)
GFR calc Af Amer: 25 mL/min — ABNORMAL LOW (ref >60)
GFR calc non Af Amer: 22 mL/min — ABNORMAL LOW (ref >60)
Glucose, Bld: 110 mg/dL — ABNORMAL HIGH (ref 70–99)
Phosphorus: 3.5 mg/dL (ref 2.5–4.6)
Potassium: 4.4 mmol/L (ref 3.5–5.1)
Sodium: 135 mmol/L (ref 135–145)

## 2019-04-01 MED ORDER — DEXTROSE 50 % IV SOLN
12.5000 g | Freq: Once | INTRAVENOUS | Status: AC
  Administered 2019-04-01: 18:00:00 12.5 g via INTRAVENOUS
  Filled 2019-04-01: qty 50

## 2019-04-01 NOTE — Progress Notes (Signed)
Palliative:  HPI: 64 yo female with medical history significant for ESRD on HD MWF (for ~3 years now), anemia of chronic disease, chronic diastolic heart failure, CAD s/p PCI, diabetes, hypertension, hyperlipidemia, hypothyroidism, COVID-19 infection and pneumonia June/July 2020 admitted 02/09/19 with altered level of consciousness. Hospitalization complicated by MRSA bacteremia, ongoing encephalopathy, PEG placement, left arm DVT, decubitus ulcers, poor functional status impacting ability to tolerate outpatient dialysis.   I discussed case with Dr. Posey Pronto, [facility], and bedside RN [name]. She is due for dialysis today with plans to complete in chair. [name] is working on placement and only offer is from [facility] in [place].   I visited today with Jamie Arias. She returns my greeting "good morning" and when I ask if she slept well last night she replies "yes." After this I was unable to get any further response from her. She does make eye contact at times but unable to follow any commands. She does have dry cough. No family at bedside.   I called and discussed with husband, Jamie Arias. I updated Jamie Arias on plans for today and from [place] for placement. I reported that I work with the palliative team and that he was previously speaking with my colleague, [name]. I acknowledge that I have only met Jamie Arias this one time today but that it is clear to me that she has struggled since July and she is still in very poor health. He tells me that she has been on dialysis for about 3 years. She has greatly declined since COVID infection in July 2020. She has had many complications since 2811. He reports desire to continue with treatment and is hopeful that she will improve further. I explained that my hope is that she will improve although I fear that she will not have any further significant improvement. I do worry about her quality of life into the future if she does not improve further. I did encourage that they  continue to speak as a family about what the means and I encouraged them to consider if Jamie Arias from a year ago were to see her situation now what would she say? What would she want? These are difficult questions to answer but questions that I cannot answer since I do not know Ms. [name] as her family does. Jamie Arias understands. CMRN will keep him updated on options for discharge.   All questions/concerns addressed. Emotional support provided.   Exam: Alert, disoriented x 4. No distress. Sleepy. Dry non-productive cough. HR regular rate. Breathing regular, unlabored. Abd soft, +PEG. Generalized weakness.   Plan: - Continue to work towards SNF placement with outpatient dialysis.  - Would benefit from outpatient palliative to follow at SNF.   Leslie, NP Palliative Medicine Team Pager [phone] (Please see [email or web address] for schedule) Team Phone [phone]    Greater than 50%  of this time was spent counseling and coordinating care related to the above assessment and plan

## 2019-04-01 NOTE — Progress Notes (Signed)
Jamie Arias NAME: Jamie Arias    MR#:  [id number]  DATE OF BIRTH:  1955/05/24  SUBJECTIVE:  patient resting. She is not opening her eyes today to verbal commands through my evaluation. No new issues per RN. She said in the chair yesterday for around four hours. Hoping to get dialysis in the chair today.  REVIEW OF SYSTEMS:   Review of Systems  Unable to perform ROS: Mental acuity   Tolerating Diet: Tolerating PT:   DRUG ALLERGIES:   Allergies  Allergen Reactions  . Nsaids Other (See Comments)    Contraindicated due to kidney disease.  Marland Kitchen Doxycycline Other (See Comments)    tremor    VITALS:  Blood pressure (!) 142/49, pulse 79, temperature 97.9 F (36.6 C), temperature source Oral, resp. rate 18, height 5' 4.02" (1.626 m), weight 83.5 kg, SpO2 97 %.  PHYSICAL EXAMINATION:   Physical Exam limited due to pt participation and MS changes  Constitutional: NAD, awake, more expression in her face today, sitting next to her husband in a chair HEENT: conjunctivae and lids normal, EOMI CV: RRR 2+ systolic murmur at upper sternal boarders. Distal pulses +2.  No cyanosis.   RESP: CTA B/L over anterior, normal respiratory effort, on RA GI: +BS, NTND, PEG tube in place Extremities: No effusions, edema, or tenderness in BLE SKIN: warm, dry, extensive bruising  LABORATORY PANEL:  CBC Recent Labs  Lab 03/30/19 0632  WBC 6.1  HGB 7.8*  HCT 25.2*  PLT 264    Chemistries  Recent Labs  Lab 03/31/19 0559  NA 136  K 4.9  CL 97*  CO2 33*  GLUCOSE 169*  BUN 36*  CREATININE 2.04*  CALCIUM 8.5*   Cardiac Enzymes No results for input(s): TROPONINI in the last 168 hours. RADIOLOGY:  No results found. ASSESSMENT AND PLAN:  [name] a 64 y.o.white femalewith end-stage renal disease on hemodialysis, atrial fibrillation, chronic diastolic congestive heart failure, coronary artery disease status post CABG,  diabetes mellitus type II, hypertension, hyperlipidemia, hypothyroidism, Covid positive in July 2020, recent nursing home stay in [place]. Prolonged hospitalization for MRSA bacteremia/sepsis and now with acute encephalopathy.   Sepsis secondary to MRSA bacteremia.   No obvious source.  Right arm graft was removed on 03/04/2019. -Continue with vancomycin for 6-week, will finish her course on 04/01/2019.  - ID following.  Acute metabolic encephalopathy:  -mental status remains mostly unchanged, still non-verbal, not moving her body herself. -  MRI of brain and EEG negative. Patient remained afebrile with no leukocytosis.  - B12 within normal range  -  She was started on thiamine challenge by neurology but not much improvement. - IR  performed LP on 03/20/19 AM: CSF shows only 8 WBC, Protein elevated at 73, Glucose 83. Negative for Toxo. CSF Cx final -- negative. Anaerobic and fungal Cx pending   - ID, Neurology assistance   - Repeat TSH was elevated. - Increased dose of Synthroid to 100 mcg -Overall prognosis seems poor.  -Palliative care consulted.  They have been following the patient and in touch with husband.  In the meantime continue medical treatment.  Bleeding from catheter site with multiple ecchymosis.   -No new episode over the past 24 to 48 hours -  warfarin was discontinued due to concern for necrosis of skin.  Hemoglobin dropped to 6.8; improved to 7.8  -transfuse as needed -continue eliquis  Anemia of chronic illness.   Status  post transfusion 1 unit of PRBC on 03/17/2019 -  hemoglobin stable at 7.8 -  Iron studies consistent with anemia of chronic illness.  MCV elevated.   -B12 within normal range -Continue EPO with dialysis.  Hypotension, chronic  Hypoalbuminemia  consider restarting midodrine if BP drops again Also getting IV albumin intermittently during dialysis  ESRD on HD (MWF).    -Currently using tunneled catheter as graft was removed due to MRSA  bacteremia. -Continue with scheduled dialysis per nephrology  Left arm DVT.  Currently on Eliquis - recent diagnosis of left arm DVT on 03/03/2019.  Hypothyroidism.  Repeat TSH elevated at 17.82. -continue Increased dose of Synthroid 100. - repeat TSH in 2 to 3 weeks.  Paroxysmal A. Fib.. Currently in sinus. Amiodarone was discontinued. -Continue Eliquis  Type 2 diabetes. -Continue SSI.  Chronic diastolic heart failure.  Volume being managed by dialysis.  Bilateral decubitus ulcers.  Present on admission. -Continue with wound care. -Wound care nurse to follow sacral wound; consulted again on 03/24/19   Nutrition.  PEG placed 03/03/2019. Continue tube feeding.  Acute hypoxic respiratory failure.  Resolved.  Patient was intubated initially, extubated on 03/05/2019. -On room air now  DVT prophylaxis: Eliquis Code Status: DNR Family Communication: pt will be updated by palliative care today Disposition Plan: Was notified by SW that pt is no longer eligible for LTAC due to insurance reasons.  Will have to pursue short-term SNF, since pt doesn't have [facility] yet (though husband had applied).   Per TOC-- one bed offer in [facility].   patient will need to sit in the dialysis chair today. Once she is able to dialysis chair time as outpatient will be obtained and if accepted at [facility] patient will discharged in the next few days  [place]: *25* minutes.  >50% time spent on counselling and coordination of care  Note: This dictation was prepared with Dragon dictation along with smaller phrase technology. Any transcriptional errors that result from this process are unintentional.  [name] M.D    [facility]   CC: Primary care physician; Jamie Arias, MDPatient ID: Jamie Arias, female   DOB: 07-22-1955, 64 y.o.   MRN: [id number]

## 2019-04-01 NOTE — TOC Progression Note (Signed)
Transition of Care [facility]) - Progression Note    Patient Details  Name: ILISE ALSTROM MRN: [id number] Date of Birth: 11-18-1955  Transition of [facility]) CM/SW Contact  Shelbie Ammons, RN Phone Number: 04/01/2019, 4:04 PM  Clinical Narrative:   RNCM contacted patient's husband with update of facility offering bed, [facility] in [facility], husband verbalizes that he would really prefer her be closer if at all possible. Offered that due to several requests that this CM would make some phone calls and would get back to him with any updates tomorrow morning.     Expected Discharge Plan: [place] Barriers to Discharge: Continued Medical Work up  Expected Discharge Plan and Services Expected Discharge Plan: [place] In-house Referral: Clinical Social Work     Living arrangements for the past 2 months: Mobile Home                                       Social Determinants of Health (SDOH) Interventions    Readmission Risk Interventions Readmission Risk Prevention Plan 02/13/2019 09/15/2018  Transportation Screening - Complete  Medication Review Press photographer) Complete Complete  PCP or Specialist appointment within 3-5 days of discharge - Complete  HRI or [place] Complete Complete  SW Recovery Care/Counseling Consult Complete Complete  [place] Not Applicable Complete  Some recent data might be hidden

## 2019-04-01 NOTE — Progress Notes (Signed)
Hypoglycemic Event  CBG: 64  Treatment: D50 25 mL (12.5 gm)  Symptoms: None  Follow-up CBG: Time:1837 CBG Result:127  Possible Reasons for Event: Unknown  Comments/MD notified:yes    Jamie Arias [facility]

## 2019-04-01 NOTE — Progress Notes (Signed)
[place], [place] 04/01/19  Subjective:  Patient seen at bedside. Awake but as before patient nonverbal. She is due for hemodialysis today. Orders have been prepared for this purpose.  Objective:  Vital signs in last 24 hours:  Temp:  [97.9 F (36.6 C)-98.3 F (36.8 C)] 98 F (36.7 C) (01/27 1202) Pulse Rate:  [75-80] 75 (01/27 1202) Resp:  [14-20] 14 (01/27 1202) BP: (131-142)/(43-49) 133/45 (01/27 1202) SpO2:  [95 %-100 %] 100 % (01/27 1202)  Weight change:  Filed Weights   03/27/19 0725 03/27/19 1045 03/31/19 1220  Weight: 82.2 kg 81 kg 83.5 kg    Intake/Output:    Intake/Output Summary (Last 24 hours) at 04/01/2019 1231 Last data filed at 04/01/2019 0438 Gross per 24 hour  Intake 521 ml  Output 0 ml  Net 521 ml     Physical Exam: General:  Chronically ill-appearing, laying in the bed  HEENT  anicteric, moist oral mucous membranes  Pulm/lungs  clear bilateral, normal effort  CVS/Heart  irregular rhythm, no rub  Abdomen:   Soft, nontender, PEG tube in place  Extremities:  Bilateral upper extremity 1+ edema  Neurologic:  Awake but not conversant  Skin:  Bilateral upper extremity ecchymoses  Access:  PermCath Left IJ       Basic Metabolic Panel:  Recent Labs  Lab 03/27/19 2010 03/27/19 2010 03/28/19 0637 03/28/19 0637 03/29/19 0348 03/30/19 0632 03/31/19 0559  NA 136  --  138  --  134* 136 136  K 5.0  --  4.4  --  5.1 5.5* 4.9  CL 100  --  100  --  97* 97* 97*  CO2 28  --  29  --  27 28 33*  GLUCOSE 184*  --  180*  --  204* 173* 169*  BUN 27*  --  36*  --  53* 75* 36*  CREATININE 1.91*  --  2.15*  --  2.76* 3.65* 2.04*  CALCIUM 8.3*   < > 8.7*   < > 8.7* 8.9 8.5*  PHOS 2.6  --  3.0  --  3.8 4.9* 3.4   < > = values in this interval not displayed.     CBC: Recent Labs  Lab 03/25/19 1400 03/26/19 0755 03/28/19 0637 03/29/19 0348 03/30/19 0632  WBC  --   --  6.4 6.1 6.1  HGB 7.9* 8.5* 7.9* 7.9* 7.8*  HCT  25.6* 28.0* 26.1* 25.9* 25.2*  MCV  --   --  102.8* 102.0* 100.8*  PLT  --   --  282 289 264      Lab Results  Component Value Date   HEPBSAG NON REACTIVE 03/18/2019      Microbiology:  No results found for this or any previous visit (from the past 240 hour(s)).  Coagulation Studies: No results for input(s): LABPROT, INR in the last 72 hours.  Urinalysis: No results for input(s): COLORURINE, LABSPEC, PHURINE, GLUCOSEU, HGBUR, BILIRUBINUR, KETONESUR, PROTEINUR, UROBILINOGEN, NITRITE, LEUKOCYTESUR in the last 72 hours.  Invalid input(s): APPERANCEUR    Imaging: No results found.   Medications:   . sodium chloride 20 mL (03/23/19 1852)  . feeding supplement (OSMOLITE 1.5 CAL) 1,000 mL (03/31/19 1840)  . vancomycin Stopped (03/30/19 1632)   . acidophilus  2 capsule Oral BID  . alteplase  2 mg Intracatheter Once  . alteplase  2 mg Intracatheter Once  . apixaban  5 mg Oral BID  . aspirin  81 mg Per Tube Daily  .  B-complex with vitamin C  1 tablet Per Tube Daily  . Chlorhexidine Gluconate Cloth  6 each Topical Q0600  . epoetin (EPOGEN/PROCRIT) injection  10,000 Units Intravenous Q M,W,F-HD  . feeding supplement (PRO-STAT SUGAR FREE 64)  30 mL Per Tube Daily  . ferrous gluconate  324 mg Oral Daily  . fluticasone  2 spray Each Nare Daily  . free water  20 mL Per Tube Q4H  . insulin aspart  0-15 Units Subcutaneous Q4H  . insulin glargine  10 Units Subcutaneous Daily  . levothyroxine  100 mcg Per Tube QAC breakfast  . mouth rinse  15 mL Mouth Rinse BID  . midodrine  10 mg Oral Q M,W,F-HD  . pantoprazole sodium  40 mg Per Tube BID  . polycarbophil  625 mg Oral BID  . psyllium  1 packet Per Tube TID   sodium chloride, acetaminophen, ipratropium-albuterol, ondansetron (ZOFRAN) IV, traMADol  Assessment/ Plan:  64 y.o. female with  end-stage renal disease on hemodialysis, atrial fibrillation, chronic diastolic congestive heart failure, coronary artery disease status post  CABG, diabetes mellitus type II, hypertension, hyperlipidemia, hypothyroidism, Covid positive in July 2020, recent nursing home stay in [place], Prolonged hospitalization for MRSA bacteremia/sepsis  admitted on 02/09/2019 for ESRD (end stage renal disease) ([place]) [N18.6] Altered mental status, unspecified altered mental status type [R41.82] Chest pain, unspecified type [R07.9] Sepsis, due to unspecified organism, unspecified whether acute organ dysfunction present ([place]) [A41.9]  [facility] MWF [place]   #End-stage renal disease Patient due for dialysis treatment today.  We have prepared orders.  Continue dialysis on MWF schedule.  #Acute encephalopathy No significant change in mental status.  She remains awake during interview but nonverbal.  #MRSA sepsis Right arm AV graft removed this admission on March 04, 2019  #Chronic hypotension We have prescribed midodrine 10 mg to be given prior to dialysis treatments.  #Anemia of chronic kidney disease Continue Epogen 10,000 IV with dialysis treatment. Lab Results  Component Value Date   HGB 7.8 (L) 03/30/2019      LOS: 70 Kayela Humphres 1/27/202112:31 PM  [place], [place]  Note: This note was prepared with Dragon dictation. Any transcription errors are unintentional

## 2019-04-01 NOTE — NC FL2 (Signed)
[place] LEVEL OF CARE SCREENING TOOL     IDENTIFICATION  Patient Name: Jamie Arias Birthdate: 05-10-1955 Sex: female Admission Date (Current Location): 02/09/2019  [place] and [place] Number:  Engineering geologist and Address:  [facility], [address], [place], [place] [address]      Provider Number: [id number]  Attending Physician Name and Address:  [name], MD  Relative Name and Phone Number:  Doroteo Bradford [phone]    Current Level of Care: Hospital Recommended Level of Care: [place] Prior Approval Number:    Date Approved/Denied:   PASRR Number: [id number]  Discharge Plan: SNF    Current Diagnoses: Patient Active Problem List   Diagnosis Date Noted  . Abscess   . AF (paroxysmal atrial fibrillation) ([place])   . Diarrhea   . Oropharyngeal dysphagia   . Acute metabolic encephalopathy   . Acute blood loss anemia   . Arm DVT (deep venous thromboembolism), acute, left ([place]) 02/13/2019  . Acute encephalopathy 02/13/2019  . Decubitus ulcer of heel, bilateral, stage 2 ([place]) 02/10/2019  . Sepsis due to methicillin resistant Staphylococcus aureus (MRSA) ([place]) 02/10/2019  . MRSA bacteremia 02/10/2019  . Altered level of consciousness 02/09/2019  . Adjustment disorder with depressed mood 08/31/2018  . COVID-19 virus infection 08/24/2018  . Acute on chronic respiratory failure with hypoxia ([place]) 08/24/2018  . UTI (urinary tract infection) 08/24/2018  . Allergic rhinitis 09/19/2017  . Uremia of renal origin 09/04/2017  . Chronic neck pain 07/25/2017  . Hyperkalemia 02/15/2017  . Complication from renal dialysis device 12/01/2016  . ESRD (end stage renal disease) ([place]) 01/19/2016  . GI bleed 10/25/2015  . Hypertensive heart disease 10/24/2015  . Chronic diastolic CHF (congestive heart failure) ([place]) 10/24/2015  . Unstable angina ([place]) 10/23/2015  . Hypotension 09/02/2015  . Chronic systolic CHF  (congestive heart failure) ([place]) 09/02/2015  . Depression 07/27/2015  . Calculus of gallbladder with chronic cholecystitis without obstruction   . Bilateral carotid bruits 11/30/2014  . Low magnesium levels 08/10/2013  . Anemia 08/10/2013  . Constipation 08/10/2013  . Atrial fibrillation, chronic ([place]) 07/30/2013  . Coronary artery disease   . CAD (coronary artery disease) 07/02/2013  . Acute systolic heart failure ([place]) 06/30/2013  . NSTEMI (non-ST elevated myocardial infarction) ([place]) 06/29/2013  . Vertigo 08/25/2012  . Sleep disorder 05/23/2012  . Hypothyroid 04/27/2012  . HTN (hypertension) 04/27/2012  . HLD (hyperlipidemia) 04/27/2012  . Type 2 diabetes mellitus with ESRD (end-stage renal disease) ([place]) 04/27/2012  . Neuropathy 04/27/2012    Orientation RESPIRATION BLADDER Height & Weight     Self  Normal Incontinent Weight: 83.5 kg Height:  5' 4.02" (162.6 cm)  BEHAVIORAL SYMPTOMS/MOOD NEUROLOGICAL BOWEL NUTRITION STATUS      Incontinent Feeding tube  AMBULATORY STATUS COMMUNICATION OF NEEDS Skin   Total Care Verbally(occasionally) PU Stage and Appropriate Care(Weeping areas to upper extremities being treated with silicone border foam dressing.)   PU Stage 2 Dressing: (silicine bordered foam dressing changed every 3 days and prn for soilage)                   Personal Care Assistance Level of Assistance  Total care Bathing Assistance: Maximum assistance Feeding assistance: Maximum assistance   Total Care Assistance: Maximum assistance   Functional Limitations Info  Speech     Speech Info: Impaired    SPECIAL CARE FACTORS FREQUENCY  PT (By licensed PT), OT (By licensed OT)  Contractures Contractures Info: Not present    Additional Factors Info  Code Status, Allergies Code Status Info: DNR Allergies Info: NSAIDS, Doxycycline           Current Medications (04/01/2019):  This is the current hospital active medication  list Current Facility-Administered Medications  Medication Dose Route Frequency Provider Last Rate Last Admin  . 0.9 %  sodium chloride infusion   Intravenous PRN Algernon Huxley, MD 5 mL/hr at 03/23/19 1852 20 mL at 03/23/19 1852  . acetaminophen (TYLENOL) tablet 650 mg  650 mg Oral Q4H PRN Lorella Nimrod, MD      . acidophilus (RISAQUAD) capsule 2 capsule  2 capsule Oral BID Thornell Mule, MD   2 capsule at 03/31/19 2309  . alteplase (CATHFLO ACTIVASE) injection 2 mg  2 mg Intracatheter Once Kolluru, Sarath, MD      . alteplase (CATHFLO ACTIVASE) injection 2 mg  2 mg Intracatheter Once Kolluru, Lurena Nida, MD      . apixaban (ELIQUIS) tablet 5 mg  5 mg Oral BID Thornell Mule, MD   5 mg at 03/31/19 2310  . aspirin chewable tablet 81 mg  81 mg Per Tube Daily Dallie Piles, RPH   81 mg at 03/31/19 0858  . B-complex with vitamin C tablet 1 tablet  1 tablet Per Tube Daily Algernon Huxley, MD   1 tablet at 03/31/19 2310  . Chlorhexidine Gluconate Cloth 2 % PADS 6 each  6 each Topical Q0600 Algernon Huxley, MD   6 each at 03/30/19 0449  . epoetin alfa (EPOGEN) injection 10,000 Units  10,000 Units Intravenous Q M,W,F-HD Kolluru, Sarath, MD   10,000 Units at 03/30/19 1131  . feeding supplement (OSMOLITE 1.5 CAL) liquid 1,000 mL  1,000 mL Per Tube Continuous Ottie Glazier, MD 50 mL/hr at 03/31/19 1840 1,000 mL at 03/31/19 1840  . feeding supplement (PRO-STAT SUGAR FREE 64) liquid 30 mL  30 mL Per Tube Daily Aleskerov, Fuad, MD   30 mL at 03/31/19 1036  . ferrous gluconate (FERGON) tablet 324 mg  324 mg Oral Daily Murlean Iba, MD   324 mg at 03/31/19 0857  . fluticasone (FLONASE) 50 MCG/ACT nasal spray 2 spray  2 spray Each Nare Daily Algernon Huxley, MD   2 spray at 03/31/19 1036  . free water 20 mL  20 mL Per Tube Q4H Ottie Glazier, MD   20 mL at 04/01/19 0330  . insulin aspart (novoLOG) injection 0-15 Units  0-15 Units Subcutaneous Q4H Algernon Huxley, MD   3 Units at 04/01/19 0431  . insulin glargine (LANTUS)  injection 10 Units  10 Units Subcutaneous Daily Enzo Bi, MD   10 Units at 03/31/19 1014  . ipratropium-albuterol (DUONEB) 0.5-2.5 (3) MG/3ML nebulizer solution 3 mL  3 mL Nebulization Q4H PRN Algernon Huxley, MD      . levothyroxine (SYNTHROID) tablet 100 mcg  100 mcg Per Tube QAC breakfast Dallie Piles, RPH   100 mcg at 04/01/19 0433  . MEDLINE mouth rinse  15 mL Mouth Rinse BID Darel Hong D, NP   15 mL at 03/31/19 2310  . midodrine (PROAMATINE) tablet 10 mg  10 mg Oral Q M,W,F-HD Lateef, [place], MD      . ondansetron (ZOFRAN) injection 4 mg  4 mg Intravenous Q6H PRN Algernon Huxley, MD   4 mg at 03/30/19 1937  . pantoprazole sodium (PROTONIX) 40 mg/20 mL oral suspension 40 mg  40 mg Per Tube BID Billie Ruddy,  Otila Kluver, MD   40 mg at 03/31/19 2308  . polycarbophil (FIBERCON) tablet 625 mg  625 mg Oral BID Enzo Bi, MD   625 mg at 03/31/19 2308  . psyllium (HYDROCIL/METAMUCIL) packet 1 packet  1 packet Per Tube TID Max Sane, MD   1 packet at 03/31/19 1651  . traMADol (ULTRAM) tablet 50 mg  50 mg Oral Q12H PRN Lorella Nimrod, MD      . vancomycin (VANCOREADY) IVPB 750 mg/150 mL  750 mg Intravenous Q M,W,F-HD Enzo Bi, MD   Stopped at 03/30/19 1632     Discharge Medications: Please see discharge summary for a list of discharge medications.  Relevant Imaging Results:  Relevant Lab Results:   Additional Information    Shelbie Ammons, RN

## 2019-04-02 LAB — RENAL FUNCTION PANEL
Albumin: 1.9 g/dL — ABNORMAL LOW (ref 3.5–5.0)
Anion gap: 8 (ref 5–15)
BUN: 31 mg/dL — ABNORMAL HIGH (ref 8–23)
CO2: 31 mmol/L (ref 22–32)
Calcium: 8.6 mg/dL — ABNORMAL LOW (ref 8.9–10.3)
Chloride: 96 mmol/L — ABNORMAL LOW (ref 98–111)
Creatinine, Ser: 2.18 mg/dL — ABNORMAL HIGH (ref 0.44–1.00)
GFR calc Af Amer: 27 mL/min — ABNORMAL LOW (ref >60)
GFR calc non Af Amer: 23 mL/min — ABNORMAL LOW (ref >60)
Glucose, Bld: 166 mg/dL — ABNORMAL HIGH (ref 70–99)
Phosphorus: 3.8 mg/dL (ref 2.5–4.6)
Potassium: 4.7 mmol/L (ref 3.5–5.1)
Sodium: 135 mmol/L (ref 135–145)

## 2019-04-02 LAB — GLUCOSE, CAPILLARY
Glucose-Capillary: 143 mg/dL — ABNORMAL HIGH (ref 70–99)
Glucose-Capillary: 140 mg/dL — ABNORMAL HIGH (ref 70–99)
Glucose-Capillary: 163 mg/dL — ABNORMAL HIGH (ref 70–99)
Glucose-Capillary: 124 mg/dL — ABNORMAL HIGH (ref 70–99)
Glucose-Capillary: 135 mg/dL — ABNORMAL HIGH (ref 70–99)

## 2019-04-02 NOTE — Progress Notes (Signed)
Contacted by [facility] that patient is getting ready for discharge to a SNF. Arranging for patient to go to [place].

## 2019-04-02 NOTE — Progress Notes (Signed)
[place], [place] 04/02/19  Subjective:  When asked how she feels today the patient did state not good. Otherwise was not conversant however. Completed dialysis yesterday.  Objective:  Vital signs in last 24 hours:  Temp:  [97.9 F (36.6 C)-98.5 F (36.9 C)] 98.3 F (36.8 C) (01/28 1305) Pulse Rate:  [69-77] 69 (01/28 1305) Resp:  [12-22] 18 (01/28 1305) BP: (100-136)/(34-52) 122/47 (01/28 1305) SpO2:  [97 %-100 %] 100 % (01/28 1305)  Weight change:  Filed Weights   03/27/19 0725 03/27/19 1045 03/31/19 1220  Weight: 82.2 kg 81 kg 83.5 kg    Intake/Output:    Intake/Output Summary (Last 24 hours) at 04/02/2019 1358 Last data filed at 04/02/2019 0400 Gross per 24 hour  Intake 883.33 ml  Output 1000 ml  Net -116.67 ml     Physical Exam: General:  Chronically ill-appearing, laying in the bed  HEENT  anicteric, moist oral mucous membranes  Pulm/lungs  clear bilateral, normal effort  CVS/Heart  irregular rhythm, no rub  Abdomen:   Soft, nontender, PEG tube in place  Extremities:  Bilateral upper extremity 1+ edema  Neurologic:  Awake alert, not consistently following commands  Skin:  Bilateral upper extremity ecchymoses  Access:  PermCath Left IJ       Basic Metabolic Panel:  Recent Labs  Lab 03/29/19 0348 03/29/19 0348 03/30/19 [id number] 03/30/19 0632 03/31/19 0559 04/01/19 0500 04/02/19 0810  NA 134*  --  136  --  136 135 135  K 5.1  --  5.5*  --  4.9 4.4 4.7  CL 97*  --  97*  --  97* 96* 96*  CO2 27  --  28  --  33* 28 31  GLUCOSE 204*  --  173*  --  169* 110* 166*  BUN 53*  --  75*  --  36* 42* 31*  CREATININE 2.76*  --  3.65*  --  2.04* 2.30* 2.18*  CALCIUM 8.7*   < > 8.9   < > 8.5* 8.3* 8.6*  PHOS 3.8  --  4.9*  --  3.4 3.5 3.8   < > = values in this interval not displayed.     CBC: Recent Labs  Lab 03/28/19 0637 03/29/19 0348 03/30/19 0632  WBC 6.4 6.1 6.1  HGB 7.9* 7.9* 7.8*  HCT 26.1* 25.9* 25.2*  MCV 102.8*  102.0* 100.8*  PLT 282 289 264      Lab Results  Component Value Date   HEPBSAG NON REACTIVE 03/18/2019      Microbiology:  No results found for this or any previous visit (from the past 240 hour(s)).  Coagulation Studies: No results for input(s): LABPROT, INR in the last 72 hours.  Urinalysis: No results for input(s): COLORURINE, LABSPEC, PHURINE, GLUCOSEU, HGBUR, BILIRUBINUR, KETONESUR, PROTEINUR, UROBILINOGEN, NITRITE, LEUKOCYTESUR in the last 72 hours.  Invalid input(s): APPERANCEUR    Imaging: No results found.   Medications:   . sodium chloride 20 mL (03/23/19 1852)  . feeding supplement (OSMOLITE 1.5 CAL) 1,000 mL (03/31/19 1840)   . acidophilus  2 capsule Oral BID  . alteplase  2 mg Intracatheter Once  . alteplase  2 mg Intracatheter Once  . apixaban  5 mg Oral BID  . aspirin  81 mg Per Tube Daily  . B-complex with vitamin C  1 tablet Per Tube Daily  . Chlorhexidine Gluconate Cloth  6 each Topical Q0600  . epoetin (EPOGEN/PROCRIT) injection  10,000 Units Intravenous Q M,W,F-HD  .  feeding supplement (PRO-STAT SUGAR FREE 64)  30 mL Per Tube Daily  . ferrous gluconate  324 mg Oral Daily  . fluticasone  2 spray Each Nare Daily  . free water  20 mL Per Tube Q4H  . insulin aspart  0-15 Units Subcutaneous Q4H  . insulin glargine  10 Units Subcutaneous Daily  . levothyroxine  100 mcg Per Tube QAC breakfast  . mouth rinse  15 mL Mouth Rinse BID  . midodrine  10 mg Oral Q M,W,F-HD  . pantoprazole sodium  40 mg Per Tube BID  . polycarbophil  625 mg Oral BID  . psyllium  1 packet Per Tube TID   sodium chloride, acetaminophen, ipratropium-albuterol, ondansetron (ZOFRAN) IV, traMADol  Assessment/ Plan:  64 y.o. female with  end-stage renal disease on hemodialysis, atrial fibrillation, chronic diastolic congestive heart failure, coronary artery disease status post CABG, diabetes mellitus type II, hypertension, hyperlipidemia, hypothyroidism, Covid positive in July  2020, recent nursing home stay in [place], Prolonged hospitalization for MRSA bacteremia/sepsis  admitted on 02/09/2019 for ESRD (end stage renal disease) ([place]) [N18.6] Altered mental status, unspecified altered mental status type [R41.82] Chest pain, unspecified type [R07.9] Sepsis, due to unspecified organism, unspecified whether acute organ dysfunction present ([place]) [A41.9]  [facility] MWF [facility]   #End-stage renal disease Patient due for dialysis again tomorrow.  Orders to be prepared.  #Acute encephalopathy Patient was awake today and did respond not good when asked how she feels.  Otherwise was not conversant however.  #MRSA sepsis Right arm AV graft removed this admission on March 04, 2019  #Chronic hypotension Continue midodrine on dialysis days.  #Anemia of chronic kidney disease Hemoglobin still low at 7.8.  Iron studies checked previously.  Continue Epogen 10,000 units IV with dialysis. Lab Results  Component Value Date   HGB 7.8 (L) 03/30/2019      LOS: 23 Persais Ethridge 1/28/20211:58 PM  [place], [place]  Note: This note was prepared with Dragon dictation. Any transcription errors are unintentional

## 2019-04-02 NOTE — Progress Notes (Signed)
The patient completed dialysis treatment up in the dialysis chair without complications reported by dialysis nurse.

## 2019-04-02 NOTE — Progress Notes (Signed)
[place] at [place] NAME: Jamie Arias    MR#:  [id number]  DATE OF BIRTH:  [birth year]  SUBJECTIVE:  patient's that for dialysis yesterday through her treatment. Husband in the room patient told me good afternoon, on asking her name she said Jamie Arias. Could not hold any further conversation no new issues per RN  REVIEW OF SYSTEMS:   Review of Systems  Unable to perform ROS: Mental acuity   Tolerating Diet: PEG feeding Tolerating PT:   DRUG ALLERGIES:   Allergies  Allergen Reactions  . Nsaids Other (See Comments)    Contraindicated due to kidney disease.  Marland Kitchen Doxycycline Other (See Comments)    tremor    VITALS:  Blood pressure (!) 126/39, pulse 77, temperature 98.4 F (36.9 C), temperature source Oral, resp. rate 16, height 5' 4.02" (1.626 m), weight 83.5 kg, SpO2 98 %.  PHYSICAL EXAMINATION:   Physical Exam limited due to pt participation and MS changes  Constitutional: NAD, awake, more expression in her face today, sitting next to her husband in a chair HEENT: conjunctivae and lids normal, EOMI CV: RRR 2+ systolic murmur at upper sternal boarders. Distal pulses +2.  No cyanosis.   RESP: CTA B/L over anterior, normal respiratory effort, on RA GI: +BS, NTND, PEG tube in place Extremities: No effusions, edema, or tenderness in BLE SKIN: warm, dry, extensive bruising  LABORATORY PANEL:  CBC Recent Labs  Lab 03/30/19 0632  WBC 6.1  HGB 7.8*  HCT 25.2*  PLT 264    Chemistries  Recent Labs  Lab 04/02/19 0810  NA 135  K 4.7  CL 96*  CO2 31  GLUCOSE 166*  BUN 31*  CREATININE 2.18*  CALCIUM 8.6*   Cardiac Enzymes No results for input(s): TROPONINI in the last 168 hours. RADIOLOGY:  No results found. ASSESSMENT AND PLAN:  [name] a 64 y.o.white femalewith end-stage renal disease on hemodialysis, atrial fibrillation, chronic diastolic congestive heart failure, coronary artery disease status  post CABG, diabetes mellitus type II, hypertension, hyperlipidemia, hypothyroidism, Covid positive in July 2020, recent nursing home stay in [place]. Prolonged hospitalization for MRSA bacteremia/sepsis and now with acute encephalopathy.   Sepsis secondary to MRSA bacteremia.   No obvious source.  Right arm graft was removed on 03/04/2019. -Completed vancomycin for 6-week course on 04/01/2019.   Acute metabolic encephalopathy:  -mental status remains mostly unchanged, says a few words every now and then. Cannot hold meaningful conversation -  MRI of brain and EEG negative. Patient remained afebrile with no leukocytosis.  - B12 within normal range  -  She was started on thiamine challenge by neurology but not much improvement. - IR  performed LP on 03/20/19 AM: CSF shows only 8 WBC, Protein elevated at 73, Glucose 83. Negative for Toxo. CSF Cx final -- negative. Anaerobic and fungal Cx pending   - ID, Neurology assistance   - Repeat TSH was elevated. - Increased dose of Synthroid to 100 mcg -Overall long-term prognosis seems poor.  -Palliative care consulted.  They have been following the patient and in touch with husband.  In the meantime continue medical treatment.  Bleeding from catheter site with multiple ecchymosis.   -No new episode over the past 24 to 48 hours -  warfarin was discontinued due to concern for necrosis of skin.  Hemoglobin dropped to 6.8; improved to 7.8  -transfuse as needed -continue eliquis  Anemia of chronic illness.   -Status post  transfusion 1 unit of PRBC on 03/17/2019 -  hemoglobin stable at 7.8 -  Iron studies consistent with anemia of chronic illness.     -B12 within normal range -Continue EPO with dialysis.  Hypotension, chronic  Hypoalbuminemia  -consider restarting midodrine if BP drops again -Also getting IV albumin intermittently during dialysis -blood pressure currently stable  ESRD on HD (MWF).    -Currently using tunneled catheter as  graft was removed due to MRSA bacteremia. -Continue with scheduled dialysis per nephrology -patient has tolerated for our dialysis treatment sitting up.  Left arm DVT.   -Currently on Eliquis - recent diagnosis of left arm DVT on 03/03/2019.  Hypothyroidism.  Repeat TSH elevated at 17.82. -continue Increased dose of Synthroid 100. - repeat TSH in 2 to 3 weeks.  Paroxysmal A. Fib.. Currently in sinus. Amiodarone was discontinued. -Continue Eliquis  Type 2 diabetes. -Continue SSI. -Lantus 10 units daily  Chronic diastolic heart failure.  - Volume being managed by dialysis.  Bilateral decubitus ulcers.  Present on admission. -Continue with wound care. -Wound care nurse to follow sacral wound; consulted again on 03/24/19   Nutrition.  PEG placed 03/03/2019. Continue tube feeding.  Acute hypoxic respiratory failure.  Resolved.  Patient was intubated initially, extubated on 03/05/2019. -On room air now  DVT prophylaxis: Eliquis Code Status: DNR Family Communication:  discussed with husband at bedside  Ddisposition Plan: rehab [facility] to d/c: obtain dialysis chair time, couple facilities in the area are reviewing her chart-- awaiting decision  TOTAL TIME TAKING CARE OF THIS PATIENT: *25* minutes.  >50% time spent on counselling and coordination of care  Note: This dictation was prepared with Dragon dictation along with smaller phrase technology. Any transcriptional errors that result from this process are unintentional.  [name] M.D    [facility]   CC: Primary care physician; [name], MDPatient ID: Jamie Arias, female   DOB: October 19, 1955, 64 y.o.   MRN: [id number]

## 2019-04-03 LAB — RENAL FUNCTION PANEL
Albumin: 1.9 g/dL — ABNORMAL LOW (ref 3.5–5.0)
Anion gap: 6 (ref 5–15)
BUN: 42 mg/dL — ABNORMAL HIGH (ref 8–23)
CO2: 34 mmol/L — ABNORMAL HIGH (ref 22–32)
Calcium: 8.4 mg/dL — ABNORMAL LOW (ref 8.9–10.3)
Chloride: 92 mmol/L — ABNORMAL LOW (ref 98–111)
Creatinine, Ser: 2.63 mg/dL — ABNORMAL HIGH (ref 0.44–1.00)
GFR calc Af Amer: 21 mL/min — ABNORMAL LOW (ref >60)
GFR calc non Af Amer: 18 mL/min — ABNORMAL LOW (ref >60)
Glucose, Bld: 136 mg/dL — ABNORMAL HIGH (ref 70–99)
Phosphorus: 4 mg/dL (ref 2.5–4.6)
Potassium: 4.8 mmol/L (ref 3.5–5.1)
Sodium: 132 mmol/L — ABNORMAL LOW (ref 135–145)

## 2019-04-03 LAB — GLUCOSE, CAPILLARY
Glucose-Capillary: 138 mg/dL — ABNORMAL HIGH (ref 70–99)
Glucose-Capillary: 143 mg/dL — ABNORMAL HIGH (ref 70–99)
Glucose-Capillary: 146 mg/dL — ABNORMAL HIGH (ref 70–99)
Glucose-Capillary: 153 mg/dL — ABNORMAL HIGH (ref 70–99)
Glucose-Capillary: 71 mg/dL (ref 70–99)
Glucose-Capillary: 75 mg/dL (ref 70–99)

## 2019-04-03 LAB — CBC
HCT: 23.3 % — ABNORMAL LOW (ref 36.0–46.0)
Hemoglobin: 7.3 g/dL — ABNORMAL LOW (ref 12.0–15.0)
MCH: 31.2 pg (ref 26.0–34.0)
MCHC: 31.3 g/dL (ref 30.0–36.0)
MCV: 99.6 fL (ref 80.0–100.0)
Platelets: 235 10*3/uL (ref 150–400)
RBC: 2.34 MIL/uL — ABNORMAL LOW (ref 3.87–5.11)
RDW: 17.6 % — ABNORMAL HIGH (ref 11.5–15.5)
WBC: 5 10*3/uL (ref 4.0–10.5)
nRBC: 0 % (ref 0.0–0.2)

## 2019-04-03 LAB — PHOSPHORUS: Phosphorus: 4.1 mg/dL (ref 2.5–4.6)

## 2019-04-03 LAB — MRSA PCR SCREENING: MRSA by PCR: NEGATIVE

## 2019-04-03 NOTE — Progress Notes (Signed)
Pre HD Tx Note Pt arrived in HD recliner chair to receive HD Tx. Pt is O*1 to self. Pt is confused. Pt is on RA SPO2 100%. BP WDL to start Tx. CVC WDL Tx should run 3.5hrs on 3K2.5Ca. Labs drawn and sent to labs as PO.   04/03/19 1415  Hand-Off documentation  Report given to (Full Name) Newt Minion RN   Report received from (Full Name) Alveria Apley RN   Vital Signs  Temp 97.8 F (36.6 C)  Temp Source Oral  Pulse Rate 71  Pulse Rate Source Monitor  Resp 18  BP (!) 148/50  BP Location Left Leg  BP Method Automatic  Patient Position (if appropriate) Lying  Oxygen Therapy  SpO2 100 %  O2 Device Room Air  Pain Assessment  Pain Scale Faces  Pain Score 0  Time-Out for Hemodialysis  What Procedure? HD  Pt Identifiers(min of two) First/Last Name;MRN/Account#  Correct Site? Yes  Correct Side? Yes  Correct Procedure? Yes  Consents Verified? Yes  Safety Precautions Reviewed? Yes  Engineer, civil (consulting) Number 1  Station Number 1  UF/Alarm Test Passed  Conductivity: Meter 13.8  Conductivity: Machine  14.1  pH 7.4  Reverse Osmosis Main  Normal Saline Lot Number [id number]  Dialyzer Lot Number [id number]  Disposable Set Lot Number [id number]  Dialysate Acid Bath Lot Number [id number]  Machine Temperature 98.6 F (37 C)  Musician and Audible Yes  Blood Lines Intact and Secured Yes  Pre Treatment Patient Checks  Vascular access used during treatment Catheter  HD catheter dressing before treatment WDL  Patient is receiving dialysis in a chair Yes  Hepatitis B Surface Antigen Results Negative  Date Hepatitis B Surface Antigen Drawn 03/18/19  Hepatitis B Surface Antibody  (<110)  Date Hepatitis B Surface Antibody Drawn 03/18/19  Hemodialysis Consent Verified Yes  Hemodialysis Standing Orders Initiated Yes  ECG (Telemetry) Monitor On Yes  Prime Ordered Normal Saline  Length of  DialysisTreatment -hour(s) 3.5 Hour(s)  Dialysis Treatment Comments  (Na140)  Dialyzer Elisio 17H NR   Dialysate 3K;2.5 Ca  Dialysis Anticoagulant None  Dialysate Flow Ordered 600  Blood Flow Rate Ordered 300 mL/min  Ultrafiltration Goal 1.5 Liters  Pre Treatment Labs Phosphorus  Education / Care Plan  Dialysis Education Provided Yes  Documented Education in Care Plan Yes  Hemodialysis Catheter Left Internal jugular Double lumen Permanent (Tunneled)  Placement Date/Time: 02/23/19 1514   Time Out: Correct patient;Correct site;Correct procedure  Maximum sterile barrier precautions: Hand hygiene;Cap;Mask;Sterile gown;Sterile gloves;Large sterile sheet  Site Prep: Chlorhexidine (preferred)  Local Anes...  Site Condition No complications  Blue Lumen Status Flushed;Blood return noted;Infusing  Red Lumen Status Infusing;Flushed;Blood return noted  Purple Lumen Status N/A  Catheter fill solution Heparin 1000 units/ml  Catheter fill volume (Arterial) 1.5 cc  Catheter fill volume (Venous) 1.5  Dressing Type Biopatch  Dressing Status Clean;Dry;Intact  Interventions Dressing reinforced  Drainage Description None  Dressing Change Due 04/04/19

## 2019-04-03 NOTE — Progress Notes (Signed)
MRSA precautions have been discontinued per infection prevention.

## 2019-04-03 NOTE — Progress Notes (Signed)
[facility] [place], [place] 04/03/19  Subjective:  Patient due for hemodialysis today. Resting comfortably. Not speaking today.  Objective:  Vital signs in last 24 hours:  Temp:  [97.7 F (36.5 C)-98.6 F (37 C)] 97.8 F (36.6 C) (01/29 1415) Pulse Rate:  [71-81] 72 (01/29 1500) Resp:  [15-24] 16 (01/29 1500) BP: (116-157)/(37-55) 116/37 (01/29 1500) SpO2:  [97 %-100 %] 99 % (01/29 1445)  Weight change:  Filed Weights   03/27/19 0725 03/27/19 1045 03/31/19 1220  Weight: 82.2 kg 81 kg 83.5 kg    Intake/Output:    Intake/Output Summary (Last 24 hours) at 04/03/2019 1511 Last data filed at 04/03/2019 0449 Gross per 24 hour  Intake 1496 ml  Output --  Net 1496 ml     Physical Exam: General:  Chronically ill-appearing, laying in the bed  HEENT  anicteric, moist oral mucous membranes  Pulm/lungs  clear bilateral, normal effort  CVS/Heart  irregular rhythm, no rub  Abdomen:   Soft, nontender, PEG tube in place  Extremities:  Bilateral upper extremity 1+ edema  Neurologic:  Awake alert, not consistently following commands  Skin:  Bilateral upper extremity ecchymoses  Access:  PermCath Left IJ       Basic Metabolic Panel:  Recent Labs  Lab 03/29/19 0348 03/29/19 0348 03/30/19 [id number] 03/30/19 0632 03/31/19 0559 04/01/19 0500 04/02/19 0810  NA 134*  --  136  --  136 135 135  K 5.1  --  5.5*  --  4.9 4.4 4.7  CL 97*  --  97*  --  97* 96* 96*  CO2 27  --  28  --  33* 28 31  GLUCOSE 204*  --  173*  --  169* 110* 166*  BUN 53*  --  75*  --  36* 42* 31*  CREATININE 2.76*  --  3.65*  --  2.04* 2.30* 2.18*  CALCIUM 8.7*   < > 8.9   < > 8.5* 8.3* 8.6*  PHOS 3.8  --  4.9*  --  3.4 3.5 3.8   < > = values in this interval not displayed.     CBC: Recent Labs  Lab 03/28/19 0637 03/29/19 0348 03/30/19 0632 04/03/19 1434  WBC 6.4 6.1 6.1 5.0  HGB 7.9* 7.9* 7.8* 7.3*  HCT 26.1* 25.9* 25.2* 23.3*  MCV 102.8* 102.0* 100.8* 99.6  PLT 282 289 264  235      Lab Results  Component Value Date   HEPBSAG NON REACTIVE 03/18/2019      Microbiology:  No results found for this or any previous visit (from the past 240 hour(s)).  Coagulation Studies: No results for input(s): LABPROT, INR in the last 72 hours.  Urinalysis: No results for input(s): COLORURINE, LABSPEC, PHURINE, GLUCOSEU, HGBUR, BILIRUBINUR, KETONESUR, PROTEINUR, UROBILINOGEN, NITRITE, LEUKOCYTESUR in the last 72 hours.  Invalid input(s): APPERANCEUR    Imaging: No results found.   Medications:   . sodium chloride 20 mL (03/23/19 1852)  . feeding supplement (OSMOLITE 1.5 CAL) 1,000 mL (03/31/19 1840)   . acidophilus  2 capsule Oral BID  . alteplase  2 mg Intracatheter Once  . alteplase  2 mg Intracatheter Once  . apixaban  5 mg Oral BID  . aspirin  81 mg Per Tube Daily  . B-complex with vitamin C  1 tablet Per Tube Daily  . Chlorhexidine Gluconate Cloth  6 each Topical Q0600  . epoetin (EPOGEN/PROCRIT) injection  10,000 Units Intravenous Q M,W,F-HD  . feeding supplement (PRO-STAT  SUGAR FREE 64)  30 mL Per Tube Daily  . ferrous gluconate  324 mg Oral Daily  . fluticasone  2 spray Each Nare Daily  . free water  20 mL Per Tube Q4H  . insulin aspart  0-15 Units Subcutaneous Q4H  . insulin glargine  10 Units Subcutaneous Daily  . levothyroxine  100 mcg Per Tube QAC breakfast  . mouth rinse  15 mL Mouth Rinse BID  . midodrine  10 mg Oral Q M,W,F-HD  . pantoprazole sodium  40 mg Per Tube BID  . polycarbophil  625 mg Oral BID  . psyllium  1 packet Per Tube TID   sodium chloride, acetaminophen, ipratropium-albuterol, ondansetron (ZOFRAN) IV, traMADol  Assessment/ Plan:  64 y.o. female with  end-stage renal disease on hemodialysis, atrial fibrillation, chronic diastolic congestive heart failure, coronary artery disease status post CABG, diabetes mellitus type II, hypertension, hyperlipidemia, hypothyroidism, Covid positive in July 2020, recent nursing home  stay in [name], Prolonged hospitalization for MRSA bacteremia/sepsis  admitted on 02/09/2019 for ESRD (end stage renal disease) ([place]) [N18.6] Altered mental status, unspecified altered mental status type [R41.82] Chest pain, unspecified type [R07.9] Sepsis, due to unspecified organism, unspecified whether acute organ dysfunction present ([place]) [A41.9]  [facility] M[facility]   #End-stage renal disease Patient is due for dialysis treatment today as per her usual schedule.  Orders have been prepared.  #Acute encephalopathy Today the patient was awake and alert but not following commands and was not conversant with [place] today.  #MRSA sepsis Right arm AV graft removed this admission on March 04, 2019  #Chronic hypotension Continue midodrine 10 mg on Monday, Wednesday, Friday.  #Anemia of chronic kidney disease Hemoglobin drifting down and currently 7.3.  Maintain the patient on Epogen 10,000 units IV with dialysis treatments. Lab Results  Component Value Date   HGB 7.3 (L) 04/03/2019      LOS: 85 Dinah Lupa 1/29/20213:11 PM  Guadalupe Guerra, [place]  Note: This note was prepared with Dragon dictation. Any transcription errors are unintentional

## 2019-04-03 NOTE — TOC Progression Note (Signed)
Transition of Care [facility]) - Progression Note    Patient Details  Name: CALEESI GLATT MRN: [id number] Date of Birth: 04-17-1955  Transition of Care [facility]) CM/SW Contact  Shelbie Ammons, RN Phone Number: 04/03/2019, 8:20 AM  Clinical Narrative:  Late entry for 04/02/2019. RNCM sent patient out to additional SNFs per request of patient's husband and his desire to keep her closer. Placed calls to [facility], [facility], [facility] who are all unable to take patient. Eventually was able to speak with Broadus John at [facility] in [place] at [facility] who both said they believed they would be able to make a bed offer. Contacted Elvera Bicker, dialysis coordinator who worked most of afternoon trying to get patient chair placement at dialysis facility close to either SNF facility. Later in afternoon RNCM received call from [place] at [facility] reporting that patient has in fact used all her Medicare days which means she will not be eligible for SNF unless husband can privately pay for room board which would be upwards of $300 a day, which according to husband they can not do. RNCM notified MD of this change.       Expected Discharge Plan: [place] Barriers to Discharge: Continued Medical Work up  Expected Discharge Plan and Services Expected Discharge Plan: [place] In-house Referral: Clinical Social Work     Living arrangements for the past 2 months: Mobile Home                                       Social Determinants of Health (SDOH) Interventions    Readmission Risk Interventions Readmission Risk Prevention Plan 02/13/2019 09/15/2018  Transportation Screening - Complete  Medication Review Press photographer) Complete Complete  PCP or Specialist appointment within 3-5 days of discharge - Complete  HRI or [place] Complete Complete  SW Recovery Care/Counseling Consult Complete Complete  [place] Not Applicable Complete  Some recent data might be hidden

## 2019-04-03 NOTE — Progress Notes (Signed)
Pre HD Tx Assessment   04/03/19 1415  Neurological  Level of Consciousness Alert  Orientation Level Disoriented X4  Respiratory  Respiratory Pattern Regular;Unlabored  Chest Assessment Chest expansion symmetrical  Bilateral Breath Sounds Diminished  Cardiac  Pulse Regular  Heart Sounds S1, S2  ECG Monitor No  Antiarrhythmic device No  Vascular  R Radial Pulse +1  L Radial Pulse +1  R Dorsalis Pedis Pulse +1  L Dorsalis Pedis Pulse +1  Edema Generalized  Generalized Edema +1  RUE Edema +2  LUE Edema +2  RLE Edema +2  LLE Edema +2  Facial +1  Integumentary  Integumentary (WDL) X  Skin Color Appropriate for ethnicity  Skin Condition Dry  Skin Integrity Ecchymosis  Ecchymosis Location Other (Comment) (generalized)  Ecchymosis Location Orientation Bilateral  Musculoskeletal  Musculoskeletal (WDL) X  Generalized Weakness Yes  Gastrointestinal  Bowel Sounds Assessment Active  Last BM Date 03/31/19  GU Assessment  Genitourinary (WDL) X  Genitourinary Symptoms Anuria (HD Pt)  Psychosocial  Psychosocial (WDL) X  Patient Behaviors Calm;Not interactive  Emotional support given Given to patient

## 2019-04-03 NOTE — Progress Notes (Addendum)
[place] [facility] [place] NAME: Jamie Arias    MR#:  [id number]  DATE OF BIRTH:  Apr 12, 1955  SUBJECTIVE:  Patient sat  for dialysis yesterday through her treatment. patient told me good morning and on asking her name she said Jamie Arias. Could not hold any further conversation no new issues per RN  REVIEW OF SYSTEMS:   Review of Systems  Unable to perform ROS: Mental acuity   Tolerating Diet: PEG feeding Tolerating PT: no--bed bound  DRUG ALLERGIES:   Allergies  Allergen Reactions  . Nsaids Other (See Comments)    Contraindicated due to kidney disease.  [place] Kitchen Doxycycline Other (See Comments)    tremor    VITALS:  Blood pressure (!) 140/43, pulse 72, temperature 97.7 F (36.5 C), temperature source Oral, resp. rate 18, height 5' 4.02" (1.626 m), weight 83.5 kg, SpO2 100 %.  PHYSICAL EXAMINATION:   Physical Exam limited due to pt participation and MS changes  Constitutional: NAD, awake, more expression in her face today, sitting next to her husband in a chair HEENT: conjunctivae and lids normal, EOMI HD cath+ CV: RRR 2+ systolic murmur at upper sternal boarders. Distal pulses +2.  No cyanosis.   RESP: CTA B/L over anterior, normal respiratory effort, on RA GI: +BS, NTND, PEG tube in place Extremities: No effusions, edema, or tenderness in BLE SKIN: warm, dry, extensive bruising  LABORATORY PANEL:  CBC Recent Labs  Lab 03/30/19 0632  WBC 6.1  HGB 7.8*  HCT 25.2*  PLT 264    Chemistries  Recent Labs  Lab 04/02/19 0810  NA 135  K 4.7  CL 96*  CO2 31  GLUCOSE 166*  BUN 31*  CREATININE 2.18*  CALCIUM 8.6*   Cardiac Enzymes No results for input(s): TROPONINI in the last 168 hours. RADIOLOGY:  No results found. ASSESSMENT AND PLAN:  [name] a 64 y.o.white femalewith end-stage renal disease on hemodialysis, atrial fibrillation, chronic diastolic congestive heart failure, coronary artery disease  status post CABG, diabetes mellitus type II, hypertension, hyperlipidemia, hypothyroidism, Covid positive in July 2020, recent nursing home stay in [place]. Prolonged hospitalization for MRSA bacteremia/sepsis and now with acute encephalopathy.  Acute metabolic encephalopathy:  -mental status remains mostly unchanged, says a few words every now and then. Cannot hold meaningful conversation -MRI of brain and EEG negative. Patient remained afebrile with no leukocytosis. -B12 within normal range - IR  performed LP on 03/20/19 AM: CSF shows only 8 WBC, Protein elevated at 73, Glucose 83. Negative for Toxo. CSF Cx final -- negative. Anaerobic and fungal Cx pending   - ID, Neurology assistance   -Overall long-term prognosis seems poor.  -Palliative care consulted.  They have been following the patient and in touch with husband.  In the meantime continue medical treatment.  Bleeding from catheter site with multiple ecchymosis.   -  warfarin was discontinued due to concern for necrosis of skin.  Hemoglobin dropped to 6.8; improved to 7.8  -transfuse as needed -continue eliquis  Sepsis secondary to MRSA bacteremia.   No obvious source.  Right arm graft was removed on 03/04/2019. -Completed vancomycin for 6-week course on 04/01/2019.   Anemia of chronic illness.   -Status post transfusion 1 unit of PRBC on 03/17/2019 -  hemoglobin stable at 7.8 -  Iron studies consistent with anemia of chronic illness.     -B12 within normal range -Continue EPO with dialysis.  Hypotension, chronic  Hypoalbuminemia  -consider  restarting midodrine if BP drops again -Also getting IV albumin intermittently during dialysis -blood pressure currently stable  ESRD on HD (MWF).    -Currently using tunneled catheter as graft was removed due to MRSA bacteremia. -Continue with scheduled dialysis per nephrology -patient has tolerated for our dialysis treatment sitting up.  Left arm DVT.   -Currently on Eliquis -  recent diagnosis of left arm DVT on 03/03/2019.  Hypothyroidism.  Repeat TSH elevated at 17.82. -continue Increased dose of Synthroid 100. - repeat TSH in 2 to 3 weeks.  Paroxysmal A. Fib.. Currently in sinus. Amiodarone was discontinued. -Continue Eliquis  Type 2 diabetes. -Continue SSI. -Lantus 10 units daily  Chronic diastolic heart failure.  - Volume being managed by dialysis.  Bilateral decubitus ulcers.  Present on admission. -Continue with wound care. -Wound care nurse to follow sacral wound; consulted again on 03/24/19   Nutrition.  PEG placed 03/03/2019. Continue tube feeding.  Acute hypoxic respiratory failure.  Resolved.  Patient was intubated initially, extubated on 03/05/2019. -On room air now  DVT prophylaxis: Eliquis Code Status: DNR Family Communication:  discussed with husband at bedside  Ddisposition Plan: rehab Barrier to d/c: pt apparently does not have medicare days left and husband cannot afford private pay. Challenging discharge disposition!  TOTAL TIME TAKING CARE OF THIS PATIENT: *15* minutes.  >50% time spent on counselling and coordination of care  Note: This dictation was prepared with Dragon dictation along with smaller phrase technology. Any transcriptional errors that result from this process are unintentional.  [name] M.D    [facility]   CC: Primary care physician; Jamie Arias, MDPatient ID: Jamie Arias, female   DOB: [birth year], 64 y.o.   MRN: [id number]

## 2019-04-03 NOTE — Progress Notes (Signed)
HD Tx started  HD Tx Started W/Out complications     0000000 1425  Vital Signs  Pulse Rate 81  Resp (!) 24  BP (!) 148/50  Oxygen Therapy  SpO2 100 %  O2 Device Room Air  During Hemodialysis Assessment  Blood Flow Rate (mL/min) 300 mL/min  Arterial Pressure (mmHg) -100 mmHg  Venous Pressure (mmHg) 80 mmHg  Transmembrane Pressure (mmHg) 50 mmHg  Ultrafiltration Rate (mL/min) 570 mL/min  Dialysate Flow Rate (mL/min) 600 ml/min  Conductivity: Machine  14.1  HD Safety Checks Performed Yes  Dialysis Fluid Bolus Normal Saline  Bolus Amount (mL) 250 mL  Intra-Hemodialysis Comments Tx initiated

## 2019-04-03 NOTE — Progress Notes (Signed)
Post HD Tx Assessment   04/03/19 1805  Neurological  Level of Consciousness Responds to Voice  Orientation Level Disoriented X4  Respiratory  Respiratory Pattern Regular;Unlabored  Chest Assessment Chest expansion symmetrical  Bilateral Breath Sounds Diminished  Cardiac  Pulse Regular  Heart Sounds S1, S2  ECG Monitor No  Antiarrhythmic device No  Vascular  R Radial Pulse +1  L Radial Pulse +1  R Dorsalis Pedis Pulse +1  L Dorsalis Pedis Pulse +1  Edema Generalized  Generalized Edema +1  RUE Edema +2  LUE Edema +2  RLE Edema +2  LLE Edema +2  Facial +1  Integumentary  Integumentary (WDL) X  Skin Color Appropriate for ethnicity  Skin Condition Dry  Skin Integrity Ecchymosis  Ecchymosis Location Other (Comment) (generalized)  Ecchymosis Location Orientation Bilateral  Musculoskeletal  Musculoskeletal (WDL) X  Generalized Weakness Yes  Gastrointestinal  Bowel Sounds Assessment Active  Last BM Date 03/31/19  GU Assessment  Genitourinary (WDL) X  Genitourinary Symptoms Anuria (HD Pt)  Psychosocial  Psychosocial (WDL) X  Patient Behaviors Calm;Not interactive  Emotional support given Given to patient

## 2019-04-03 NOTE — Progress Notes (Signed)
HD Tx Completed    04/03/19 1800  Vital Signs  Pulse Rate 67  Resp 14  BP 96/65  Oxygen Therapy  SpO2 100 %  O2 Device Room Air  During Hemodialysis Assessment  Blood Flow Rate (mL/min) 300 mL/min  Arterial Pressure (mmHg) -110 mmHg  Venous Pressure (mmHg) 90 mmHg  Transmembrane Pressure (mmHg) 50 mmHg  Ultrafiltration Rate (mL/min) 330 mL/min  Dialysate Flow Rate (mL/min) 600 ml/min  Conductivity: Machine  13.8  HD Safety Checks Performed Yes  Intra-Hemodialysis Comments Tx completed;Tolerated well

## 2019-04-03 NOTE — Plan of Care (Signed)
  Problem: Clinical Measurements: Goal: Ability to maintain clinical measurements within normal limits will improve Outcome: Progressing Goal: Will remain free from infection Outcome: Progressing Goal: Diagnostic test results will improve Outcome: Progressing Goal: Respiratory complications will improve Outcome: Progressing Goal: Cardiovascular complication will be avoided Outcome: Progressing  HD Dialysis Tx progressing without complications

## 2019-04-03 NOTE — Progress Notes (Signed)
Post HD Tx Note Pt continues to be disoriented. Pt is on RA SPO2 100% BP WDL except that it was low intermittently throughout the Tx and UF had to be turned off to restore BP. UF goal prescribed of 2L was not met. Actual UF goal 1.383L. BP WDL post Tx. CVC Heparin locked closed   04/03/19 1815  Hand-Off documentation  Report given to (Full Name) Alveria Apley RN   Report received from (Full Name) Newt Minion RN   Vital Signs  Temp 98.6 F (37 C)  Temp Source Axillary  Pulse Rate 72  Pulse Rate Source Monitor  Resp 17  BP (!) 112/31  BP Location Left Leg  BP Method Automatic  Patient Position (if appropriate) Lying  Oxygen Therapy  SpO2 100 %  O2 Device Room Air  Pain Assessment  Pain Scale Faces  Pain Score 0  Post-Hemodialysis Assessment  Rinseback Volume (mL) 250 mL  KECN 59.8 V  Dialyzer Clearance Lightly streaked  Duration of HD Treatment -hour(s) 3.5 hour(s)  Hemodialysis Intake (mL) 500 mL  UF Total -Machine (mL) 1383 mL  Net UF (mL) 883 mL  Tolerated HD Treatment Yes  Hemodialysis Catheter Left Internal jugular Double lumen Permanent (Tunneled)  Placement Date/Time: 02/23/19 1514   Time Out: Correct patient;Correct site;Correct procedure  Maximum sterile barrier precautions: Hand hygiene;Cap;Mask;Sterile gown;Sterile gloves;Large sterile sheet  Site Prep: Chlorhexidine (preferred)  Local Anes...  Site Condition No complications  Blue Lumen Status Heparin locked  Red Lumen Status Heparin locked  Purple Lumen Status N/A  Catheter fill solution Heparin 1000 units/ml  Catheter fill volume (Arterial) 1.5 cc  Catheter fill volume (Venous) 1.5  Dressing Type Biopatch  Dressing Status Clean;Dry;Intact  Interventions Dressing reinforced  Drainage Description None  Dressing Change Due 04/05/19  Post treatment catheter status Capped and Clamped   and clamped

## 2019-04-03 NOTE — Progress Notes (Signed)
MD notified about CSF fluid sent out to be tested and lab called indicating the [facility] clinic had rejected specimen as it was a minimal amount that they could not test. MRSA swab performed.

## 2019-04-04 LAB — RENAL FUNCTION PANEL
Albumin: 1.9 g/dL — ABNORMAL LOW (ref 3.5–5.0)
Anion gap: 8 (ref 5–15)
BUN: 22 mg/dL (ref 8–23)
CO2: 32 mmol/L (ref 22–32)
Calcium: 8.3 mg/dL — ABNORMAL LOW (ref 8.9–10.3)
Chloride: 98 mmol/L (ref 98–111)
Creatinine, Ser: 1.74 mg/dL — ABNORMAL HIGH (ref 0.44–1.00)
GFR calc Af Amer: 35 mL/min — ABNORMAL LOW (ref >60)
GFR calc non Af Amer: 30 mL/min — ABNORMAL LOW (ref >60)
Glucose, Bld: 174 mg/dL — ABNORMAL HIGH (ref 70–99)
Phosphorus: 3.6 mg/dL (ref 2.5–4.6)
Potassium: 5.4 mmol/L — ABNORMAL HIGH (ref 3.5–5.1)
Sodium: 138 mmol/L (ref 135–145)

## 2019-04-04 LAB — GLUCOSE, CAPILLARY
Glucose-Capillary: 118 mg/dL — ABNORMAL HIGH (ref 70–99)
Glucose-Capillary: 133 mg/dL — ABNORMAL HIGH (ref 70–99)
Glucose-Capillary: 148 mg/dL — ABNORMAL HIGH (ref 70–99)
Glucose-Capillary: 161 mg/dL — ABNORMAL HIGH (ref 70–99)
Glucose-Capillary: 164 mg/dL — ABNORMAL HIGH (ref 70–99)
Glucose-Capillary: 206 mg/dL — ABNORMAL HIGH (ref 70–99)

## 2019-04-04 NOTE — Progress Notes (Signed)
Jamie Arias  MRN: [id number]  DOB/AGE: [birth year] 64 y.o.  Primary Care Physician:Sonnenberg, Angela Adam, MD  Admit date: 02/09/2019  Chief Complaint:  Chief Complaint  Patient presents with  . Altered Mental Status    S-Pt presented on  02/09/2019 with  Chief Complaint  Patient presents with  . Altered Mental Status  . Patient is able to respond , does not offer any complaints.   Medications . acidophilus  2 capsule Oral BID  . alteplase  2 mg Intracatheter Once  . alteplase  2 mg Intracatheter Once  . apixaban  5 mg Oral BID  . aspirin  81 mg Per Tube Daily  . B-complex with vitamin C  1 tablet Per Tube Daily  . Chlorhexidine Gluconate Cloth  6 each Topical Q0600  . epoetin (EPOGEN/PROCRIT) injection  10,000 Units Intravenous Q M,W,F-HD  . feeding supplement (PRO-STAT SUGAR FREE 64)  30 mL Per Tube Daily  . ferrous gluconate  324 mg Oral Daily  . fluticasone  2 spray Each Nare Daily  . free water  20 mL Per Tube Q4H  . insulin aspart  0-15 Units Subcutaneous Q4H  . insulin glargine  10 Units Subcutaneous Daily  . levothyroxine  100 mcg Per Tube QAC breakfast  . mouth rinse  15 mL Mouth Rinse BID  . midodrine  10 mg Oral Q M,W,F-HD  . pantoprazole sodium  40 mg Per Tube BID  . polycarbophil  625 mg Oral BID  . psyllium  1 packet Per Tube TID         ROS: Unable to get any data    physical Exam: Vital signs in last 24 hours: Temp:  [97.7 F (36.5 C)-98.8 F (37.1 C)] 98.6 F (37 C) (01/30 0357) Pulse Rate:  [67-81] 68 (01/30 0357) Resp:  [13-24] 20 (01/30 0357) BP: (72-157)/(31-65) 156/53 (01/30 0357) SpO2:  [96 %-100 %] 100 % (01/30 0357) Weight change:  Last BM Date: 03/31/19  Intake/Output from previous day: 01/29 0701 - 01/30 0700 In: 869 [NG/GT:80] Out: 883  No intake/output data recorded.   Physical Exam: General-in no acute distress, patient is chronically ill appearing  Resp- No acute REsp distress, decreased breath sound at bases  CVS-  S1S2 regular in rate and rhythm GIT- BS+, soft, NT, ND, PEG tube in situ EXT- NO LE Edema, No Cyanosis Access left IJ permacath   Lab Results: CBC Recent Labs    04/03/19 1434  WBC 5.0  HGB 7.3*  HCT 23.3*  PLT 235    BMET Recent Labs    04/03/19 1434 04/04/19 0731  NA 132* 138  K 4.8 5.4*  CL 92* 98  CO2 34* 32  GLUCOSE 136* 174*  BUN 42* 22  CREATININE 2.63* 1.74*  CALCIUM 8.4* 8.3*    MICRO Recent Results (from the past 240 hour(s))  MRSA PCR Screening     Status: None   Collection Time: 04/03/19  8:15 PM   Specimen: Nasopharyngeal  Result Value Ref Range Status   MRSA by PCR NEGATIVE NEGATIVE Final    Comment:        The GeneXpert MRSA Assay (FDA approved for NASAL specimens only), is one component of a comprehensive MRSA colonization surveillance program. It is not intended to diagnose MRSA infection nor to guide or monitor treatment for MRSA infections. Performed at [facility], [address], [place], [place] [address]       Lab Results  Component Value Date   PTH  135 (H) 02/22/2017   CALCIUM 8.3 (L) 04/04/2019   CAION 1.20 07/03/2013   PHOS 3.6 04/04/2019               Impression:  64 y.o.caucasian female with end-stage renal disease, atrial fibrillation, chronic diastolic congestive heart failure, CAD status post PCI, CABG,  type 2 diabetes, hypertension, hyperlipidemia, hypothyroidism, Covid positive in July 2020, recent nursing home stay in [place],Prolonged hospitalization for MRSA bacteremia/sepsis and now with acute encephalopathy  1)Renal ESRD on hemodialysis Patient is on Monday Wednesday Friday schedule as an outpatient  NEPHROLOGIST: Lavonia Dana MD  LOCATION: [place]: M-W-F 2nd Shift  EDW: 81.5 kg.  KIDNEY: [place] Optiflux 180NR  LITERS PROC: liters/treatment  HD TIME: 225  ACCESS: R Upper Arm AVF  NEEDLE SIZE:  ANTICOAG: Custom Heparin 2000  BATH: 2K/2.5Ca  QB: 400  ml/min  QD: 600 ml/min    As an inpatient-Patient was dialyzed yesterday/Friday Patient was last dialyzed yesterday No need for hemodialysis today     2) hypotension Blood pressure is stable Patient is on midodrine  3)Anemia of chronic disease  HGb is not at goal (9--11) Patient did receive PRBC during this admission.  Patient is on Epogen during dialysis  4) secondary hyperparathyroidism -CKD Mineral-Bone Disorder   Secondary Hyperparathyroidism present Patient phosphorus is now at goal  5) altered mental status Patient has metabolic encephalopathy Is being closely followed by the primary team   6) hyperkalemia Patient was dialyzed yesterday   7)Acid base Co2 at goal  8) MRSA sepsis  Patient was admitted with septic shock secondary to MRSA bacteremia Patient patient had septic shock Blood cultures were positive for MRSA on  12/7,12/8,12/9,12/11, 12/13 & 12/15 12/16 blood culture results showed no growth  Source of the bacteremia possibly AV graft now removed-03/04/2019 Patient has had transthoracic echo as well as CT spine Now better Pt completed her 6 weeks on IV Vanco on  1.27.21  Plan:  We will dialyze patient Monday  No acute indication for renal replacement therapy/dialysis today   Orley Lawry s Sommer Spickard 04/04/2019, 9:38 AM

## 2019-04-04 NOTE — Progress Notes (Signed)
Jamie Arias NAME: Jamie Arias    MR#:  [id number]  DATE OF BIRTH:  28-May-1955  SUBJECTIVE:  Patient sat  for dialysis during her last session throughout her treatment. no new issues per RN  REVIEW OF SYSTEMS:   Review of Systems  Unable to perform ROS: Mental acuity   Tolerating Diet: PEG feeding Tolerating PT: no--bed bound  DRUG ALLERGIES:   Allergies  Allergen Reactions  . Nsaids Other (See Comments)    Contraindicated due to kidney disease.  Marland Kitchen Doxycycline Other (See Comments)    tremor    VITALS:  Blood pressure (!) 134/42, pulse 73, temperature (!) 97.5 F (36.4 C), temperature source Axillary, resp. rate 18, height 5' 4.02" (1.626 m), weight 83.5 kg, SpO2 97 %.  PHYSICAL EXAMINATION:   Physical Exam limited due to pt participation and MS changes  Constitutional: NAD, awake, more expression in her face today, sitting next to her husband in a chair HEENT: conjunctivae and lids normal, EOMI HD cath+ CV: RRR 2+ systolic murmur at upper sternal boarders. Distal pulses +2.  No cyanosis.   RESP: CTA B/L over anterior, normal respiratory effort, on RA GI: +BS, NTND, PEG tube in place Extremities: No effusions, edema, or tenderness in BLE SKIN: warm, dry, extensive bruising  LABORATORY PANEL:  CBC Recent Labs  Lab 04/03/19 1434  WBC 5.0  HGB 7.3*  HCT 23.3*  PLT 235    Chemistries  Recent Labs  Lab 04/04/19 0731  NA 138  K 5.4*  CL 98  CO2 32  GLUCOSE 174*  BUN 22  CREATININE 1.74*  CALCIUM 8.3*   Cardiac Enzymes No results for input(s): TROPONINI in the last 168 hours. RADIOLOGY:  No results found. ASSESSMENT AND PLAN:  Ms.Marianela F Kinneyis a 64 y.o.white femalewith end-stage renal disease on hemodialysis, atrial fibrillation, chronic diastolic congestive heart failure, coronary artery disease status post CABG, diabetes mellitus type II, hypertension, hyperlipidemia, hypothyroidism, Covid  positive in July 2020, recent nursing home stay in [place]. Prolonged hospitalization for MRSA bacteremia/sepsis and now with acute encephalopathy.  Acute metabolic encephalopathy:  -mental status remains mostly unchanged, says a few words every now and then. Cannot hold meaningful conversation -MRI of brain and EEG negative. Patient remained afebrile with no leukocytosis. -B12 within normal range - IR  performed LP on 03/20/19 AM: CSF shows only 8 WBC, Protein elevated at 73, Glucose 83. Negative for Toxo. CSF Cx final -- negative. Anaerobic and fungal Cx pending   - ID, Neurology assistance   -Overall long-term prognosis seems poor.  -Palliative care consulted.  They have been following the patient and in touch with husband.  In the meantime continue medical treatment.  Bleeding from catheter site with multiple ecchymosis.   -  warfarin was discontinued due to concern for necrosis of skin.  Hemoglobin dropped to 6.8; improved to 7.8  -transfuse as needed -continue eliquis  Sepsis secondary to MRSA bacteremia.   No obvious source.  Right arm graft was removed on 03/04/2019. -Completed vancomycin for 6-week course on 04/01/2019.   Anemia of chronic illness.   -Status post transfusion 1 unit of PRBC on 03/17/2019 -  hemoglobin stable at 7.8 -  Iron studies consistent with anemia of chronic illness.     -B12 within normal range -Continue EPO with dialysis.  Hypotension, chronic  Hypoalbuminemia  -consider restarting midodrine if BP drops again -Also getting IV albumin intermittently during dialysis -blood pressure currently  stable  ESRD on HD (MWF).    -Currently using tunneled catheter as graft was removed due to MRSA bacteremia. -Continue with scheduled dialysis per nephrology -patient has tolerated for our dialysis treatment sitting up.  Left arm DVT.   -Currently on Eliquis - recent diagnosis of left arm DVT on 03/03/2019.  Hypothyroidism.  Repeat TSH elevated at  17.82. -continue Increased dose of Synthroid 100. - repeat TSH in 2 to 3 weeks.  Paroxysmal A. Fib.. Currently in sinus. Amiodarone was discontinued. -Continue Eliquis  Type 2 diabetes. -Continue SSI. -Lantus 10 units daily  Chronic diastolic heart failure.  - Volume being managed by dialysis.  Bilateral decubitus ulcers.  Present on admission. -Continue with wound care. -Wound care nurse to follow sacral wound; consulted again on 03/24/19   Nutrition.  PEG placed 03/03/2019. Continue tube feeding.  Acute hypoxic respiratory failure.  Resolved.  Patient was intubated initially, extubated on 03/05/2019. -On room air now  DVT prophylaxis: Eliquis Code Status: DNR Family Communication:  discussed with husband at bedside  Ddisposition Plan: rehab Barrier to d/c: pt apparently does not have medicare days left and husband cannot afford private pay. Challenging discharge disposition!  TOTAL TIME TAKING CARE OF THIS PATIENT: *15* minutes.  >50% time spent on counselling and coordination of care  Note: This dictation was prepared with Dragon dictation along with smaller phrase technology. Any transcriptional errors that result from this process are unintentional.  [name] M.D    [facility]   CC: Primary care physician; [name], MDPatient ID: Gevena Arias, female   DOB: 01-20-1956, 64 y.o.   MRN: [id number]

## 2019-04-05 ENCOUNTER — Inpatient Hospital Stay: Payer: Medicare Other

## 2019-04-05 LAB — GLUCOSE, CAPILLARY
Glucose-Capillary: 165 mg/dL — ABNORMAL HIGH (ref 70–99)
Glucose-Capillary: 119 mg/dL — ABNORMAL HIGH (ref 70–99)
Glucose-Capillary: 180 mg/dL — ABNORMAL HIGH (ref 70–99)
Glucose-Capillary: 131 mg/dL — ABNORMAL HIGH (ref 70–99)
Glucose-Capillary: 159 mg/dL — ABNORMAL HIGH (ref 70–99)

## 2019-04-05 LAB — RENAL FUNCTION PANEL
Albumin: 1.9 g/dL — ABNORMAL LOW (ref 3.5–5.0)
Anion gap: 14 (ref 5–15)
BUN: 34 mg/dL — ABNORMAL HIGH (ref 8–23)
CO2: 30 mmol/L (ref 22–32)
Calcium: 8.6 mg/dL — ABNORMAL LOW (ref 8.9–10.3)
Chloride: 91 mmol/L — ABNORMAL LOW (ref 98–111)
Creatinine, Ser: 2.45 mg/dL — ABNORMAL HIGH (ref 0.44–1.00)
GFR calc Af Amer: 23 mL/min — ABNORMAL LOW (ref >60)
GFR calc non Af Amer: 20 mL/min — ABNORMAL LOW (ref >60)
Glucose, Bld: 170 mg/dL — ABNORMAL HIGH (ref 70–99)
Phosphorus: 4.4 mg/dL (ref 2.5–4.6)
Potassium: 4.8 mmol/L (ref 3.5–5.1)
Sodium: 135 mmol/L (ref 135–145)

## 2019-04-05 NOTE — Care Management (Signed)
TOC RN CM: Incoming call from [place], patient spouse, states that he applied for Medicaid 3 weeks ago and is working with Francisco Capuchin who can be reached at [phone] M-F to discuss possibility of coverage for SNF transfer.

## 2019-04-05 NOTE — Progress Notes (Signed)
[facility] [place] NAME: Jamie Arias    MR#:  [id number]  DATE OF BIRTH:  [birth year]  SUBJECTIVE:  Nausea today Had good BM per Rn  REVIEW OF SYSTEMS:   Review of Systems  Unable to perform ROS: Mental acuity   Tolerating Diet: PEG feeding Tolerating PT: no--bed bound  DRUG ALLERGIES:   Allergies  Allergen Reactions  . Nsaids Other (See Comments)    Contraindicated due to kidney disease.  Marland Kitchen Doxycycline Other (See Comments)    tremor    VITALS:  Blood pressure (!) 147/55, pulse 72, temperature 98.4 F (36.9 C), temperature source Axillary, resp. rate 15, height 5' 4.02" (1.626 m), weight 83.5 kg, SpO2 98 %.  PHYSICAL EXAMINATION:   Physical Exam limited due to pt participation and MS changes  Constitutional: NAD, awake, more expression in her face today, sitting next to her husband in a chair HEENT: conjunctivae and lids normal, EOMI HD cath+ CV: RRR 2+ systolic murmur at upper sternal boarders. Distal pulses +2.  No cyanosis.   RESP: CTA B/L over anterior, normal respiratory effort, on RA GI: +BS, NTND, PEG tube in place Extremities: No effusions, edema, or tenderness in BLE SKIN: warm, dry, extensive bruising  LABORATORY PANEL:  CBC Recent Labs  Lab 04/03/19 1434  WBC 5.0  HGB 7.3*  HCT 23.3*  PLT 235    Chemistries  Recent Labs  Lab 04/05/19 0507  NA 135  K 4.8  CL 91*  CO2 30  GLUCOSE 170*  BUN 34*  CREATININE 2.45*  CALCIUM 8.6*   Cardiac Enzymes No results for input(s): TROPONINI in the last 168 hours. RADIOLOGY:  DG Abd 1 View  Result Date: 04/05/2019 CLINICAL DATA:  Nausea.  End-stage renal disease. EXAM: ABDOMEN - 1 VIEW COMPARISON:  None. FINDINGS: Two supine views. Cholecystectomy. Prior median sternotomy. Cardiomegaly. No gaseous distention of bowel loops. No pneumatosis. Lucency about the upper abdomen is favored to be within or adjacent the pannus. Vascular calcifications. IMPRESSION:  No acute findings. Electronically Signed   By: Abigail Miyamoto M.D.   On: 04/05/2019 16:10   ASSESSMENT AND PLAN:  [name] a 64 y.o.white femalewith end-stage renal disease on hemodialysis, atrial fibrillation, chronic diastolic congestive heart failure, coronary artery disease status post CABG, diabetes mellitus type II, hypertension, hyperlipidemia, hypothyroidism, Covid positive in July 2020, recent nursing home stay in [place]. Prolonged hospitalization for MRSA bacteremia/sepsis and now with acute encephalopathy.  Acute metabolic encephalopathy:  -mental status remains mostly unchanged, says a few words every now and then. Cannot hold meaningful conversation -MRI of brain and EEG negative. Patient remained afebrile with no leukocytosis. -B12 within normal range - IR  performed LP on 03/20/19 AM: CSF shows only 8 WBC, Protein elevated at 73, Glucose 83. Negative for Toxo. CSF Cx final -- negative. Anaerobic and fungal Cx pending   - ID, Neurology assistance   -Overall long-term prognosis seems poor.  -Palliative care consulted.  They have been following the patient and in touch with husband.  In the meantime continue medical treatment.  Sepsis secondary to MRSA bacteremia.   No obvious source.  Right arm graft was removed on 66/30/2020. -Completed vancomycin for 6-week course on 04/01/2019.   Anemia of chronic illness.   -Status post transfusion 1 unit of PRBC on 03/17/2019 -  hemoglobin stable at 7.8 -  Iron studies consistent with anemia of chronic illness.     -B12 within normal range -  Continue EPO with dialysis.  Hypotension, chronic  Hypoalbuminemia  -consider restarting midodrine if BP drops again -Also getting IV albumin intermittently during dialysis -blood pressure currently stable  ESRD on HD (MWF).    -Currently using tunneled catheter as graft was removed due to MRSA bacteremia. -Continue with scheduled dialysis per nephrology -patient has tolerated  for our dialysis treatment sitting up.  Left arm DVT.   -Currently on Eliquis - recent diagnosis of left arm DVT on 03/03/2019.  Hypothyroidism.  Repeat TSH elevated at 17.82. -continue Increased dose of Synthroid 100. - repeat TSH in 2 to 3 weeks.  Paroxysmal A. Fib.. Currently in sinus. Amiodarone was discontinued. -Continue Eliquis  Type 2 diabetes. -Continue SSI. -Lantus 10 units daily  Chronic diastolic heart failure.  - Volume being managed by dialysis.  Bilateral decubitus ulcers.  Present on admission. -Continue with wound care. -Wound care nurse to follow sacral wound; consulted again on 03/24/19   Nutrition.  PEG placed 03/03/2019. Continue tube feeding.  Acute hypoxic respiratory failure.  Resolved.  Patient was intubated initially, extubated on 03/05/2019. -On room air now  DVT prophylaxis: Eliquis Code Status: DNR Family Communication:  discussed with husband at bedside  Ddisposition Plan: rehab Barrier to d/c: pt apparently does not have medicare days left and husband cannot afford private pay. Challenging discharge disposition!  TOTAL TIME TAKING CARE OF THIS PATIENT: *15* minutes.  >50% time spent on counselling and coordination of care  Note: This dictation was prepared with Dragon dictation along with smaller phrase technology. Any transcriptional errors that result from this process are unintentional.  [name] M.D    [facility]   CC: Primary care physician; [name], MDPatient ID: Jamie Arias, female   DOB: 1955/10/04, 64 y.o.   MRN: [id number]

## 2019-04-05 NOTE — Progress Notes (Signed)
Contacted Casey RD as patient continues to have constant nausea. Per Myriam Jacobson she would discontinue the metamucil for now, but may have to restart if patient begins to have loose stools again. Per Dr. Posey Pronto ordered KUB to ensure no obstruction or ileus. KUB demonstrated no acute changes. Provide antiemetics and continue monitoring patient.   Fuller Mandril, RN

## 2019-04-05 NOTE — Progress Notes (Signed)
Jamie Arias  MRN: [id number]  DOB/AGE: December 10, 1955 64 y.o.  Primary Care Physician:Arias, Jamie Adam, MD  Admit date: 02/09/2019  Chief Complaint:  Chief Complaint  Patient presents with  . Altered Mental Status    S-Pt presented on  02/09/2019 with  Chief Complaint  Patient presents with  . Altered Mental Status  . Patient is responding to commands.Pt offer no any complaints.   Medications . acidophilus  2 capsule Oral BID  . alteplase  2 mg Intracatheter Once  . alteplase  2 mg Intracatheter Once  . apixaban  5 mg Oral BID  . aspirin  81 mg Per Tube Daily  . B-complex with vitamin C  1 tablet Per Tube Daily  . Chlorhexidine Gluconate Cloth  6 each Topical Q0600  . epoetin (EPOGEN/PROCRIT) injection  10,000 Units Intravenous Q M,W,F-HD  . feeding supplement (PRO-STAT SUGAR FREE 64)  30 mL Per Tube Daily  . ferrous gluconate  324 mg Oral Daily  . fluticasone  2 spray Each Nare Daily  . free water  20 mL Per Tube Q4H  . insulin aspart  0-15 Units Subcutaneous Q4H  . insulin glargine  10 Units Subcutaneous Daily  . levothyroxine  100 mcg Per Tube QAC breakfast  . mouth rinse  15 mL Mouth Rinse BID  . midodrine  10 mg Oral Q M,W,F-HD  . pantoprazole sodium  40 mg Per Tube BID  . polycarbophil  625 mg Oral BID  . psyllium  1 packet Per Tube TID         ROS: Unable to get any data    physical Exam: Vital signs in last 24 hours: Temp:  [98.4 F (36.9 C)] 98.4 F (36.9 C) (01/31 1153) Pulse Rate:  [72] 72 (01/31 1153) Resp:  [15-19] 15 (01/31 1153) BP: (137-147)/(47-55) 147/55 (01/31 1153) SpO2:  [98 %] 98 % (01/31 1153) Weight change:  Last BM Date: 04/04/19  Intake/Output from previous day: No intake/output data recorded. No intake/output data recorded.   Physical Exam: General-in no acute distress, patient is chronically ill appearing  Resp- No acute REsp distress, decreased breath sound at bases  CVS- S1S2 regular in rate and rhythm GIT- BS+,  soft, NT, ND, PEG tube in situ EXT- NO LE Edema, No Cyanosis Access left IJ permacath   Lab Results: CBC Recent Labs    04/03/19 1434  WBC 5.0  HGB 7.3*  HCT 23.3*  PLT 235    BMET Recent Labs    04/04/19 0731 04/05/19 0507  NA 138 135  K 5.4* 4.8  CL 98 91*  CO2 32 30  GLUCOSE 174* 170*  BUN 22 34*  CREATININE 1.74* 2.45*  CALCIUM 8.3* 8.6*    MICRO Recent Results (from the past 240 hour(s))  MRSA PCR Screening     Status: None   Collection Time: 04/03/19  8:15 PM   Specimen: Nasopharyngeal  Result Value Ref Range Status   MRSA by PCR NEGATIVE NEGATIVE Final    Comment:        The GeneXpert MRSA Assay (FDA approved for NASAL specimens only), is one component of a comprehensive MRSA colonization surveillance program. It is not intended to diagnose MRSA infection nor to guide or monitor treatment for MRSA infections. Performed at [facility], [address], [place], [place] [address]       Lab Results  Component Value Date   PTH 135 (H) 02/22/2017   CALCIUM 8.6 (L) 04/05/2019   CAION 1.20  07/03/2013   PHOS 4.4 04/05/2019               Impression:  64 y.o.caucasian female with end-stage renal disease, atrial fibrillation, chronic diastolic congestive heart failure, CAD status post PCI, CABG,  type 2 diabetes, hypertension, hyperlipidemia, hypothyroidism, Covid positive in July 2020, recent nursing home stay in [place],Prolonged hospitalization for MRSA bacteremia/sepsis and now with acute encephalopathy  1)Renal ESRD on hemodialysis Patient is on Monday Wednesday Friday schedule as an outpatient  NEPHROLOGIST: Lavonia Dana MD  LOCATION: [place] [facility]: M-W-F 2nd Shift  EDW: 81.5 kg.  KIDNEY: [place] Optiflux 180NR  LITERS PROC: liters/treatment  HD TIME: 225  ACCESS: R Upper Arm AVF  NEEDLE SIZE:  ANTICOAG: Custom Heparin 2000  BATH: 2K/2.5Ca  QB: 400 ml/min  QD: 600 ml/min    As an  inpatient-Patient was dialyzed yesterday/Friday Patient was last dialyzed yesterday No need for hemodialysis today     2) hypotension Blood pressure is stable Patient is on midodrine  3)Anemia of chronic disease  HGb is not at goal (9--11) Patient did receive PRBC during this admission.  Patient is on Epogen during dialysis  4) secondary hyperparathyroidism -CKD Mineral-Bone Disorder   Secondary Hyperparathyroidism present Patient phosphorus is now at goal  5) altered mental status Patient has metabolic encephalopathy Is being closely followed by the primary team   6) hyperkalemia Now better   7)Acid base Co2 at goal  8) MRSA sepsis  Patient was admitted with septic shock secondary to MRSA bacteremia Patient patient had septic shock Blood cultures were positive for MRSA on  12/7,12/8,12/9,12/11, 12/13 & 12/15 12/16 blood culture results showed no growth  Source of the bacteremia possibly AV graft now removed-03/04/2019 Patient has had transthoracic echo as well as CT spine Now better Pt completed her 6 weeks on IV Vanco on  1.27.21  Plan:  We will dialyze patient Monday  No acute indication for renal replacement therapy/dialysis today   [name] s Christeen Lai 04/05/2019, 1:52 PM

## 2019-04-06 LAB — GLUCOSE, CAPILLARY
Glucose-Capillary: 126 mg/dL — ABNORMAL HIGH (ref 70–99)
Glucose-Capillary: 141 mg/dL — ABNORMAL HIGH (ref 70–99)
Glucose-Capillary: 143 mg/dL — ABNORMAL HIGH (ref 70–99)
Glucose-Capillary: 165 mg/dL — ABNORMAL HIGH (ref 70–99)
Glucose-Capillary: 76 mg/dL (ref 70–99)
Glucose-Capillary: 93 mg/dL (ref 70–99)

## 2019-04-06 LAB — CBC
HCT: 22.5 % — ABNORMAL LOW (ref 36.0–46.0)
Hemoglobin: 6.9 g/dL — ABNORMAL LOW (ref 12.0–15.0)
MCH: 30.8 pg (ref 26.0–34.0)
MCHC: 30.7 g/dL (ref 30.0–36.0)
MCV: 100.4 fL — ABNORMAL HIGH (ref 80.0–100.0)
Platelets: 248 10*3/uL (ref 150–400)
RBC: 2.24 MIL/uL — ABNORMAL LOW (ref 3.87–5.11)
RDW: 17.5 % — ABNORMAL HIGH (ref 11.5–15.5)
WBC: 5.2 10*3/uL (ref 4.0–10.5)
nRBC: 0 % (ref 0.0–0.2)

## 2019-04-06 LAB — RENAL FUNCTION PANEL
Albumin: 1.9 g/dL — ABNORMAL LOW (ref 3.5–5.0)
Anion gap: 8 (ref 5–15)
BUN: 46 mg/dL — ABNORMAL HIGH (ref 8–23)
CO2: 33 mmol/L — ABNORMAL HIGH (ref 22–32)
Calcium: 8.4 mg/dL — ABNORMAL LOW (ref 8.9–10.3)
Chloride: 93 mmol/L — ABNORMAL LOW (ref 98–111)
Creatinine, Ser: 3 mg/dL — ABNORMAL HIGH (ref 0.44–1.00)
GFR calc Af Amer: 18 mL/min — ABNORMAL LOW (ref >60)
GFR calc non Af Amer: 16 mL/min — ABNORMAL LOW (ref >60)
Glucose, Bld: 159 mg/dL — ABNORMAL HIGH (ref 70–99)
Phosphorus: 4.6 mg/dL (ref 2.5–4.6)
Potassium: 5.1 mmol/L (ref 3.5–5.1)
Sodium: 134 mmol/L — ABNORMAL LOW (ref 135–145)

## 2019-04-06 LAB — PREPARE RBC (CROSSMATCH)

## 2019-04-06 MED ORDER — SODIUM CHLORIDE 0.9% IV SOLUTION: Freq: Once | INTRAVENOUS | Status: AC

## 2019-04-06 MED ORDER — FOLIC ACID 1 MG PO TABS
1.0000 mg | ORAL_TABLET | Freq: Every day | ORAL | Status: DC
  Administered 2019-04-07 – 2019-04-08 (×2): 1 mg
  Filled 2019-04-06 (×2): qty 1

## 2019-04-06 NOTE — Progress Notes (Signed)
PT Cancellation Note  Patient Details Name: Jamie Arias MRN: [id number] DOB: 02-01-1956   Cancelled Treatment:    Reason Eval/Treat Not Completed: Patient at procedure or test/unavailable(Chart reviewed for attempted at treatment session.  Patient currently off unit for dialysis. Will re-attempt at later time/date as medically appropriate and available.)   Burleigh Brockmann H. Owens Shark, PT, DPT, NCS 04/06/19, 9:31 AM [phone]

## 2019-04-06 NOTE — Progress Notes (Signed)
[facility]  ROUNDING NOTE   Subjective:   Seen and examined on hemodialysis treatment. Seated in chair. Tolerating treatment well. UF 1.5 liters    HEMODIALYSIS FLOWSHEET:  Blood Flow Rate (mL/min): 400 mL/min Arterial Pressure (mmHg): -150 mmHg Venous Pressure (mmHg): 120 mmHg Transmembrane Pressure (mmHg): 70 mmHg Ultrafiltration Rate (mL/min): 570 mL/min Dialysate Flow Rate (mL/min): 800 ml/min Conductivity: Machine : 13.8 Conductivity: Machine : 13.8 Dialysis Fluid Bolus: Normal Saline Bolus Amount (mL): 250 mL Dialysate Change: (3K)    Objective:  Vital signs in last 24 hours:  Temp:  [98 F (36.7 C)] 98 F (36.7 C) (02/01 0915) Pulse Rate:  [68-82] 71 (02/01 1300) Resp:  [4-20] 11 (02/01 1300) BP: (73-171)/(46-69) 112/59 (02/01 1300) SpO2:  [97 %-100 %] 100 % (02/01 1300)  Weight change:  Filed Weights   03/27/19 0725 03/27/19 1045 03/31/19 1220  Weight: 82.2 kg 81 kg 83.5 kg    Intake/Output: No intake/output data recorded.   Intake/Output this shift:  Total I/O In: -  Out: 1500 [Other:1500]  Physical Exam: General: NAD, seated in chair  Head: Normocephalic, atraumatic.  Eyes: Anicteric, PERRL  Neck: Supple, trachea midline  Lungs:  Clear to auscultation  Heart: Regular rate and rhythm  Abdomen:  Soft, nontender,   Extremities:  right upper extremity edema.  Neurologic: Verbal but not consistent with answers, +tremor  Skin: No lesions  Access: LIJ permcath    Basic Metabolic Panel: Recent Labs  Lab 04/02/19 0810 04/02/19 0810 04/03/19 1434 04/03/19 1434 04/04/19 0731 04/05/19 0507 04/06/19 0930  NA 135  --  132*  --  138 135 134*  K 4.7  --  4.8  --  5.4* 4.8 5.1  CL 96*  --  92*  --  98 91* 93*  CO2 31  --  34*  --  32 30 33*  GLUCOSE 166*  --  136*  --  174* 170* 159*  BUN 31*  --  42*  --  22 34* 46*  CREATININE 2.18*  --  2.63*  --  1.74* 2.45* 3.00*  CALCIUM 8.6*   < > 8.4*   < > 8.3* 8.6* 8.4*  PHOS 3.8  --   4.1  4.0  --  3.6 4.4 4.6   < > = values in this interval not displayed.    Liver Function Tests: Recent Labs  Lab 04/02/19 0810 04/03/19 1434 04/04/19 0731 04/05/19 0507 04/06/19 0930  ALBUMIN 1.9* 1.9* 1.9* 1.9* 1.9*   No results for input(s): LIPASE, AMYLASE in the last 168 hours. No results for input(s): AMMONIA in the last 168 hours.  CBC: Recent Labs  Lab 04/03/19 1434 04/06/19 0500  WBC 5.0 5.2  HGB 7.3* 6.9*  HCT 23.3* 22.5*  MCV 99.6 100.4*  PLT 235 248    Cardiac Enzymes: No results for input(s): CKTOTAL, CKMB, CKMBINDEX, TROPONINI in the last 168 hours.  BNP: Invalid input(s): POCBNP  CBG: Recent Labs  Lab 04/05/19 1649 04/05/19 2004 04/06/19 0025 04/06/19 0448 04/06/19 0729  GLUCAP 131* 159* 141* 143* 165*    Microbiology: Results for orders placed or performed during the hospital encounter of 02/09/19  Culture, blood (routine x 2)     Status: Abnormal   Collection Time: 02/09/19  2:29 PM   Specimen: BLOOD LEFT ARM  Result Value Ref Range Status   Specimen Description   Final    BLOOD LEFT ARM Performed at [facility], [address] [place], [place] [address]  Special Requests   Final    BOTTLES DRAWN AEROBIC AND ANAEROBIC Blood Culture adequate volume Performed at [facility], [address] [place], [place] [address]    Culture  Setup Time   Final    GRAM POSITIVE COCCI IN BOTH AEROBIC AND ANAEROBIC BOTTLES CRITICAL VALUE NOTED.  VALUE IS CONSISTENT WITH PREVIOUSLY REPORTED AND CALLED VALUE. Performed at [facility], [address] [place], [place] [address]    Culture (A)  Final    STAPHYLOCOCCUS AUREUS SUSCEPTIBILITIES PERFORMED ON PREVIOUS CULTURE WITHIN THE LAST 5 DAYS. Performed at [facility], [place] [address], [place], [place] [address]    Report Status 02/12/2019 FINAL  Final  Culture, blood (routine x 2)     Status: Abnormal   Collection Time: 02/09/19  2:38 PM   Specimen:  BLOOD LEFT ARM  Result Value Ref Range Status   Specimen Description   Final    BLOOD LEFT ARM Performed at [facility], [address] [place], [place] [address]    Special Requests   Final    BOTTLES DRAWN AEROBIC AND ANAEROBIC Blood Culture adequate volume Performed at [facility], [address] [place], [place] [address]    Culture  Setup Time   Final    GRAM POSITIVE COCCI IN BOTH AEROBIC AND ANAEROBIC BOTTLES CRITICAL RESULT CALLED TO, READ BACK BY AND VERIFIED WITH: [place] [id number] 02/10/2019 HNM Performed at [facility], [place] [address] [place], [place] [address]    Culture METHICILLIN RESISTANT STAPHYLOCOCCUS AUREUS (A)  Final   Report Status 02/12/2019 FINAL  Final   Organism ID, Bacteria METHICILLIN RESISTANT STAPHYLOCOCCUS AUREUS  Final      Susceptibility   Methicillin resistant staphylococcus aureus - MIC*    CIPROFLOXACIN >=8 RESISTANT Resistant     ERYTHROMYCIN >=8 RESISTANT Resistant     GENTAMICIN <=0.5 SENSITIVE Sensitive     OXACILLIN >=4 RESISTANT Resistant     TETRACYCLINE <=1 SENSITIVE Sensitive     VANCOMYCIN 1 SENSITIVE Sensitive     TRIMETH/SULFA <=10 SENSITIVE Sensitive     CLINDAMYCIN RESISTANT Resistant     RIFAMPIN <=0.5 SENSITIVE Sensitive     Inducible Clindamycin POSITIVE Resistant     * METHICILLIN RESISTANT STAPHYLOCOCCUS AUREUS  Blood Culture ID Panel (Reflexed)     Status: Abnormal   Collection Time: 02/09/19  2:38 PM  Result Value Ref Range Status   Enterococcus species NOT DETECTED NOT DETECTED Final   Listeria monocytogenes NOT DETECTED NOT DETECTED Final   Staphylococcus species DETECTED (A) NOT DETECTED Final    Comment: CRITICAL RESULT CALLED TO, READ BACK BY AND VERIFIED WITH: Hart Robinsons PHARMD [id number] 02/10/2019 HNM    Staphylococcus aureus (BCID) DETECTED (A) NOT DETECTED Final    Comment: Methicillin (oxacillin)-resistant Staphylococcus aureus (MRSA). MRSA is predictably resistant to beta-lactam  antibiotics (except ceftaroline). Preferred therapy is vancomycin unless clinically contraindicated. Patient requires contact precautions if  hospitalized. CRITICAL RESULT CALLED TO, READ BACK BY AND VERIFIED WITH: Hart Robinsons PHARMD [id number] 02/10/2019 HNM    Methicillin resistance DETECTED (A) NOT DETECTED Final    Comment: CRITICAL RESULT CALLED TO, READ BACK BY AND VERIFIED WITH: Hart Robinsons PHARMD [id number] 02/10/2019 HNM    Streptococcus species NOT DETECTED NOT DETECTED Final   Streptococcus agalactiae NOT DETECTED NOT DETECTED Final   Streptococcus pneumoniae NOT DETECTED NOT DETECTED Final   Streptococcus pyogenes NOT DETECTED NOT DETECTED Final   Acinetobacter baumannii NOT DETECTED NOT DETECTED  Final   Enterobacteriaceae species NOT DETECTED NOT DETECTED Final   Enterobacter cloacae complex NOT DETECTED NOT DETECTED Final   Escherichia coli NOT DETECTED NOT DETECTED Final   Klebsiella oxytoca NOT DETECTED NOT DETECTED Final   Klebsiella pneumoniae NOT DETECTED NOT DETECTED Final   Proteus species NOT DETECTED NOT DETECTED Final   Serratia marcescens NOT DETECTED NOT DETECTED Final   Haemophilus influenzae NOT DETECTED NOT DETECTED Final   Neisseria meningitidis NOT DETECTED NOT DETECTED Final   Pseudomonas aeruginosa NOT DETECTED NOT DETECTED Final   Candida albicans NOT DETECTED NOT DETECTED Final   Candida glabrata NOT DETECTED NOT DETECTED Final   Candida krusei NOT DETECTED NOT DETECTED Final   Candida parapsilosis NOT DETECTED NOT DETECTED Final   Candida tropicalis NOT DETECTED NOT DETECTED Final    Comment: Performed at [facility], [place], [place] [address]  SARS CORONAVIRUS 2 (TAT 6-24 HRS) Nasopharyngeal Nasopharyngeal Swab     Status: None   Collection Time: 02/09/19  2:54 PM   Specimen: Nasopharyngeal Swab  Result Value Ref Range Status   SARS Coronavirus 2 NEGATIVE NEGATIVE Final    Comment: (NOTE) SARS-CoV-2 target nucleic acids are NOT  DETECTED. The SARS-CoV-2 RNA is generally detectable in upper and lower respiratory specimens during the acute phase of infection. Negative results do not preclude SARS-CoV-2 infection, do not rule out co-infections with other pathogens, and should not be used as the sole basis for treatment or other patient management decisions. Negative results must be combined with clinical observations, patient history, and epidemiological information. The expected result is Negative. Fact Sheet for Patients: SugarRoll.be Fact Sheet for Healthcare Providers: https://www.woods-mathews.com/ This test is not yet approved or cleared by the [place] FDA and  has been authorized for detection and/or diagnosis of SARS-CoV-2 by FDA under an Emergency Use Authorization (EUA). This EUA will remain  in effect (meaning this test can be used) for the duration of the COVID-19 declaration under Section 56 4(b)(1) of the Act, 21 U.S.C. section 360bbb-3(b)(1), unless the authorization is terminated or revoked sooner. Performed at [facility], [place] [address], [place], [place] [address]   MRSA PCR Screening     Status: Abnormal   Collection Time: 02/10/19  5:03 PM   Specimen: Nasopharyngeal  Result Value Ref Range Status   MRSA by PCR POSITIVE (A) NEGATIVE Final    Comment:        The GeneXpert MRSA Assay (FDA approved for NASAL specimens only), is one component of a comprehensive MRSA colonization surveillance program. It is not intended to diagnose MRSA infection nor to guide or monitor treatment for MRSA infections. RESULT CALLED TO, READ BACK BY AND VERIFIED WITH: Britt Boozer @1839  02/10/19 MJU Performed at [facility], [address] [place], [place] [address]   Culture, blood (routine x 2)     Status: Abnormal   Collection Time: 02/11/19 12:31 PM   Specimen: BLOOD  Result Value Ref Range Status   Specimen Description   Final     BLOOD PORTA CATH Performed at [place] [address] [place], [place] [address]    Special Requests   Final    BOTTLES DRAWN AEROBIC AND ANAEROBIC Blood Culture adequate volume Performed at [place] [place], [place] [address]    Culture  Setup Time   Final    GRAM POSITIVE COCCI ANAEROBIC BOTTLE ONLY CRITICAL RESULT CALLED TO, READ BACK BY AND VERIFIED  WITH: Hart Robinsons AT [id number] 02/12/2019 [facility]  Performed at [facility], [address] [place], [place] [address]    Culture (A)  Final    STAPHYLOCOCCUS AUREUS SUSCEPTIBILITIES PERFORMED ON PREVIOUS CULTURE WITHIN THE LAST 5 DAYS. Performed at [facility], [place] [address], [place], [place] [address]    Report Status 02/13/2019 FINAL  Final  Culture, blood (routine x 2)     Status: None   Collection Time: 02/11/19  3:57 PM   Specimen: BLOOD  Result Value Ref Range Status   Specimen Description BLOOD PORTA CATH  Final   Special Requests   Final    BOTTLES DRAWN AEROBIC AND ANAEROBIC Blood Culture adequate volume   Culture   Final    NO GROWTH 5 DAYS Performed at [facility], [address] [place], [place] [address]    Report Status 02/16/2019 FINAL  Final  CULTURE, BLOOD (ROUTINE X 2) w Reflex to ID Panel     Status: Abnormal   Collection Time: 02/13/19  2:18 PM   Specimen: BLOOD LEFT HAND  Result Value Ref Range Status   Specimen Description BLOOD LEFT HAND  Final   Special Requests   Final    BOTTLES DRAWN AEROBIC AND ANAEROBIC Blood Culture results may not be optimal due to an inadequate volume of blood received in culture bottles   Culture  Setup Time   Final    AEROBIC BOTTLE ONLY GRAM POSITIVE COCCI IN CLUSTERS CRITICAL RESULT CALLED TO, READ BACK BY AND VERIFIED WITH: Herbert Pun AT [id number] ON 02/14/2019 [place]. Performed at [facility], [place] [address], [place], [place] [address]    Culture METHICILLIN RESISTANT STAPHYLOCOCCUS AUREUS (A)  Final    Report Status 02/16/2019 FINAL  Final   Organism ID, Bacteria METHICILLIN RESISTANT STAPHYLOCOCCUS AUREUS  Final      Susceptibility   Methicillin resistant staphylococcus aureus - MIC*    CIPROFLOXACIN >=8 RESISTANT Resistant     ERYTHROMYCIN >=8 RESISTANT Resistant     GENTAMICIN <=0.5 SENSITIVE Sensitive     OXACILLIN >=4 RESISTANT Resistant     TETRACYCLINE <=1 SENSITIVE Sensitive     VANCOMYCIN <=0.5 SENSITIVE Sensitive     TRIMETH/SULFA <=10 SENSITIVE Sensitive     CLINDAMYCIN RESISTANT Resistant     RIFAMPIN <=0.5 SENSITIVE Sensitive     Inducible Clindamycin POSITIVE Resistant     * METHICILLIN RESISTANT STAPHYLOCOCCUS AUREUS  Blood Culture ID Panel (Reflexed)     Status: Abnormal   Collection Time: 02/13/19  2:18 PM  Result Value Ref Range Status   Enterococcus species NOT DETECTED NOT DETECTED Final   Listeria monocytogenes NOT DETECTED NOT DETECTED Final   Staphylococcus species DETECTED (A) NOT DETECTED Final    Comment: CRITICAL RESULT CALLED TO, READ BACK BY AND VERIFIED WITH: ABBEY E[place]INGTON AT [id number] ON 02/14/2019 [place].    Staphylococcus aureus (BCID) DETECTED (A) NOT DETECTED Final    Comment: Methicillin (oxacillin)-resistant Staphylococcus aureus (MRSA). MRSA is predictably resistant to beta-lactam antibiotics (except ceftaroline). Preferred therapy is vancomycin unless clinically contraindicated. Patient requires contact precautions if  hospitalized. CRITICAL RESULT CALLED TO, READ BACK BY AND VERIFIED WITH: Herbert Pun AT [id number] ON 02/14/2019 [place].    Methicillin resistance DETECTED (A) NOT DETECTED Final    Comment: CRITICAL RESULT CALLED TO, READ BACK BY AND VERIFIED WITH: Herbert Pun AT [id number] ON 02/14/2019 [place].    Streptococcus species NOT DETECTED NOT DETECTED Final   Streptococcus agalactiae NOT DETECTED NOT DETECTED  Final   Streptococcus pneumoniae NOT DETECTED NOT DETECTED Final   Streptococcus pyogenes NOT DETECTED NOT DETECTED Final    Acinetobacter baumannii NOT DETECTED NOT DETECTED Final   Enterobacteriaceae species NOT DETECTED NOT DETECTED Final   Enterobacter cloacae complex NOT DETECTED NOT DETECTED Final   Escherichia coli NOT DETECTED NOT DETECTED Final   Klebsiella oxytoca NOT DETECTED NOT DETECTED Final   Klebsiella pneumoniae NOT DETECTED NOT DETECTED Final   Proteus species NOT DETECTED NOT DETECTED Final   Serratia marcescens NOT DETECTED NOT DETECTED Final   Haemophilus influenzae NOT DETECTED NOT DETECTED Final   Neisseria meningitidis NOT DETECTED NOT DETECTED Final   Pseudomonas aeruginosa NOT DETECTED NOT DETECTED Final   Candida albicans NOT DETECTED NOT DETECTED Final   Candida glabrata NOT DETECTED NOT DETECTED Final   Candida krusei NOT DETECTED NOT DETECTED Final   Candida parapsilosis NOT DETECTED NOT DETECTED Final   Candida tropicalis NOT DETECTED NOT DETECTED Final    Comment: Performed at [facility], [address] [place], [place] [address]  CULTURE, BLOOD (ROUTINE X 2) w Reflex to ID Panel     Status: None   Collection Time: 02/13/19  3:09 PM   Specimen: BLOOD  Result Value Ref Range Status   Specimen Description BLOOD BLOOD LEFT HAND  Final   Special Requests   Final    BOTTLES DRAWN AEROBIC AND ANAEROBIC Blood Culture adequate volume   Culture   Final    NO GROWTH 5 DAYS Performed at [facility], [address] [place], [place] [address]    Report Status 02/18/2019 FINAL  Final  Culture, blood (routine x 2)     Status: Abnormal   Collection Time: 02/15/19 12:49 PM   Specimen: BLOOD  Result Value Ref Range Status   Specimen Description   Final    BLOOD LT HAND Performed at [facility], [address] [place], [place] [address]    Special Requests   Final    BOTTLES DRAWN AEROBIC AND ANAEROBIC Blood Culture results may not be optimal due to an inadequate volume of blood received in culture bottles Performed at [facility], [address] [place], [place] [address]    Culture  Setup Time   Final    GRAM POSITIVE COCCI IN BOTH AEROBIC AND ANAEROBIC BOTTLES CRITICAL RESULT CALLED TO, READ BACK BY AND VERIFIED WITHEleonore Chiquito AT [id number] 02/16/2019 SDR Performed at [facility], [address] [place], [place] [address]    Culture (A)  Final    METHICILLIN RESISTANT STAPHYLOCOCCUS AUREUS STAPHYLOCOCCUS SPECIES (COAGULASE NEGATIVE) THE SIGNIFICANCE OF ISOLATING THIS ORGANISM FROM A SINGLE SET OF BLOOD CULTURES WHEN MULTIPLE SETS ARE DRAWN IS UNCERTAIN. PLEASE NOTIFY THE MICROBIOLOGY DEPARTMENT WITHIN ONE WEEK IF SPECIATION AND SENSITIVITIES ARE REQUIRED. Performed at [facility], [place] [address] [place], [place] [address]    Report Status 02/18/2019 FINAL  Final   Organism ID, Bacteria METHICILLIN RESISTANT STAPHYLOCOCCUS AUREUS  Final      Susceptibility   Methicillin resistant staphylococcus aureus - MIC*    CIPROFLOXACIN >=8 RESISTANT Resistant     ERYTHROMYCIN >=8 RESISTANT Resistant     GENTAMICIN <=0.5 SENSITIVE Sensitive     OXACILLIN >=4 RESISTANT Resistant     TETRACYCLINE <=1 SENSITIVE Sensitive     VANCOMYCIN <=0.5 SENSITIVE Sensitive     TRIMETH/SULFA <=10 SENSITIVE Sensitive     CLINDAMYCIN RESISTANT Resistant     RIFAMPIN <=0.5 SENSITIVE Sensitive  Inducible Clindamycin POSITIVE Resistant     * METHICILLIN RESISTANT STAPHYLOCOCCUS AUREUS  Aerobic Culture (superficial specimen)     Status: None   Collection Time: 02/17/19  1:31 PM   Specimen: Heel; Wound  Result Value Ref Range Status   Specimen Description   Final    HEEL Performed at [facility], [address], [place], [place] [address]    Special Requests   Final    NONE Performed at [facility], [address] [place], [place] [address]    Gram Stain   Final    NO WBC SEEN MODERATE GRAM POSITIVE COCCI IN PAIRS    Culture   Final    ABUNDANT METHICILLIN RESISTANT STAPHYLOCOCCUS  AUREUS ABUNDANT DIPHTHEROIDS(CORYNEBACTERIUM SPECIES) Standardized susceptibility testing for this organism is not available. Performed at [facility], [place] [address] [place], [place] [address]    Report Status 02/19/2019 FINAL  Final   Organism ID, Bacteria METHICILLIN RESISTANT STAPHYLOCOCCUS AUREUS  Final      Susceptibility   Methicillin resistant staphylococcus aureus - MIC*    CIPROFLOXACIN >=8 RESISTANT Resistant     ERYTHROMYCIN >=8 RESISTANT Resistant     GENTAMICIN <=0.5 SENSITIVE Sensitive     OXACILLIN >=4 RESISTANT Resistant     TETRACYCLINE <=1 SENSITIVE Sensitive     VANCOMYCIN 1 SENSITIVE Sensitive     TRIMETH/SULFA <=10 SENSITIVE Sensitive     CLINDAMYCIN RESISTANT Resistant     RIFAMPIN <=0.5 SENSITIVE Sensitive     Inducible Clindamycin POSITIVE Resistant     * ABUNDANT METHICILLIN RESISTANT STAPHYLOCOCCUS AUREUS  CULTURE, BLOOD (ROUTINE X 2) w Reflex to ID Panel     Status: None   Collection Time: 02/18/19 11:29 PM   Specimen: BLOOD  Result Value Ref Range Status   Specimen Description BLOOD LEFT ARM  Final   Special Requests   Final    BOTTLES DRAWN AEROBIC AND ANAEROBIC Blood Culture adequate volume   Culture   Final    NO GROWTH 5 DAYS Performed at [facility], [address] [place], [place] [address]    Report Status 02/23/2019 FINAL  Final  CULTURE, BLOOD (ROUTINE X 2) w Reflex to ID Panel     Status: None   Collection Time: 02/18/19 11:29 PM   Specimen: BLOOD  Result Value Ref Range Status   Specimen Description BLOOD LEFT ARM  Final   Special Requests   Final    BOTTLES DRAWN AEROBIC AND ANAEROBIC Blood Culture adequate volume   Culture   Final    NO GROWTH 5 DAYS Performed at [facility], [address], [place], [place] [address]    Report Status 02/23/2019 FINAL  Final  Aerobic/Anaerobic Culture (surgical/deep wound)     Status: None   Collection Time: 03/04/19 11:04 AM   Specimen: PATH Other; Tissue  Result  Value Ref Range Status   Specimen Description   Final    ARTERY Performed at [facility], [address], [place], [place] [address]    Special Requests   Final    RIGHT AV GRAFT AND STENTS CULTURE Performed at [facility], [address] [place], [place] [address]    Gram Stain   Final    RARE WBC PRESENT, PREDOMINANTLY PMN NO ORGANISMS SEEN    Culture   Final    No growth aerobically or anaerobically. Performed at [facility], [place] [address] [place], [place] [address]    Report Status  03/09/2019 FINAL  Final  Aerobic/Anaerobic Culture (surgical/deep wound)     Status: None   Collection Time: 03/04/19 11:29 AM   Specimen: PATH Other; Tissue  Result Value Ref Range Status   Specimen Description   Final    ARTERY Performed at [facility], [address] [place], [place] [address]    Special Requests   Final    FLUID AROUND AV GRAFT Performed at [facility], [address] [place], [place] [address]    Gram Stain   Final    FEW WBC PRESENT, PREDOMINANTLY PMN NO ORGANISMS SEEN    Culture   Final    No growth aerobically or anaerobically. Performed at [facility], [place] [address], [place], [place] [address]    Report Status 03/09/2019 FINAL  Final  Wet prep, genital     Status: Abnormal   Collection Time: 03/16/19 10:01 AM  Result Value Ref Range Status   Yeast Wet Prep HPF POC PRESENT (A) NONE SEEN Final   Trich, Wet Prep NONE SEEN NONE SEEN Final   Clue Cells Wet Prep HPF POC NONE SEEN NONE SEEN Final   WBC, Wet Prep HPF POC MANY (A) NONE SEEN Final   Sperm NONE SEEN  Final    Comment: Performed at [facility], [address], [place], [place] [address]  Aerobic Culture (superficial specimen)     Status: None   Collection Time: 03/17/19  5:13 PM   Specimen: Vaginal Fluid  Result Value Ref Range Status   Specimen Description   Final    VAGINA Performed at [facility], [address] [place], [place] [address]    Special Requests   Final    NONE Performed at [facility], [address] [place], [place] [address]    Gram Stain   Final    MODERATE WBC PRESENT,BOTH PMN AND MONONUCLEAR FEW GRAM POSITIVE COCCI IN PAIRS FEW GRAM NEGATIVE RODS Performed at [facility], [place] [address], [place],  [address]    Culture   Final    ABUNDANT ESCHERICHIA COLI MODERATE KLEBSIELLA PNEUMONIAE    Report Status 03/21/2019 FINAL  Final   Organism ID, Bacteria ESCHERICHIA COLI  Final   Organism ID, Bacteria KLEBSIELLA PNEUMONIAE  Final      Susceptibility   Escherichia coli - MIC*    AMPICILLIN 4 SENSITIVE Sensitive     CEFAZOLIN <=4 SENSITIVE Sensitive     CEFEPIME <=0.12 SENSITIVE Sensitive     CEFTAZIDIME <=1 SENSITIVE Sensitive     CEFTRIAXONE <=0.25 SENSITIVE Sensitive     CIPROFLOXACIN <=0.25 SENSITIVE Sensitive     GENTAMICIN <=1 SENSITIVE Sensitive     IMIPENEM <=0.25 SENSITIVE Sensitive     TRIMETH/SULFA <=20 SENSITIVE Sensitive     AMPICILLIN/SULBACTAM <=2 SENSITIVE Sensitive     PIP/TAZO <=4 SENSITIVE Sensitive     * ABUNDANT ESCHERICHIA COLI   Klebsiella pneumoniae - MIC*    AMPICILLIN >=32 RESISTANT Resistant     CEFAZOLIN <=4 SENSITIVE Sensitive     CEFEPIME <=0.12 SENSITIVE Sensitive     CEFTAZIDIME <=1 SENSITIVE Sensitive     CEFTRIAXONE <=0.25 SENSITIVE Sensitive     CIPROFLOXACIN <=0.25 SENSITIVE Sensitive     GENTAMICIN <=1 SENSITIVE Sensitive     IMIPENEM <=0.25 SENSITIVE Sensitive     TRIMETH/SULFA <=20 SENSITIVE Sensitive     AMPICILLIN/SULBACTAM 4 SENSITIVE Sensitive     PIP/TAZO <=4 SENSITIVE Sensitive     * MODERATE  KLEBSIELLA PNEUMONIAE  Anaerobic culture     Status: None   Collection Time: 03/20/19 12:40 PM   Specimen: PATH Cytology CSF; Cerebrospinal Fluid  Result Value Ref Range Status   Specimen Description   Final    CSF Performed at [facility], [address], [place], [place] [address]     Special Requests   Final    NONE Performed at [facility], [address] [place], [place] [address]    Gram Stain   Final    WBC PRESENT, PREDOMINANTLY MONONUCLEAR NO ORGANISMS SEEN CYTOSPIN SMEAR    Culture   Final    NO ANAEROBES ISOLATED Performed at [facility], [place] [address], [place], [place] [address]    Report Status 03/27/2019 FINAL  Final  CSF culture     Status: None   Collection Time: 03/20/19 12:40 PM   Specimen: PATH Cytology CSF; Cerebrospinal Fluid  Result Value Ref Range Status   Specimen Description   Final    CSF Performed at [facility], [address], [place], [place] [address]    Special Requests   Final    NONE Performed at [facility] [facility], [address] [place], [place] [address]    Gram Stain   Final    WBC PRESENT, PREDOMINANTLY MONONUCLEAR NO ORGANISMS SEEN CYTOSPIN SMEAR    Culture   Final    NO GROWTH 3 DAYS Performed at [facility], [place] [address], [place], [place] [address]    Report Status 03/24/2019 FINAL  Final  Fungus Culture With Stain     Status: None (Preliminary result)   Collection Time: 03/20/19 12:40 PM   Specimen: PATH Cytology CSF; Cerebrospinal Fluid  Result Value Ref Range Status   Fungus Stain Final report  Final    Comment: (NOTE) Performed At: [facility] [address] [place], [place] [id number] Rush Farmer MD [id number]    Fungus (Mycology) Culture PENDING  Incomplete   Fungal Source CSF  Final    Comment: Performed at [facility], [address] [place], [place] [address]  Fungus Culture Result     Status: None   Collection Time: 03/20/19 12:40 PM  Result Value Ref Range Status   Result 1 Comment  Final    Comment: (NOTE) KOH/Calcofluor preparation:  no fungus observed. Performed At: [facility] [place], [place] [id number] Rush Farmer MD [id number]   MRSA PCR Screening     Status: None   Collection  Time: 04/03/19  8:15 PM   Specimen: Nasopharyngeal  Result Value Ref Range Status   MRSA by PCR NEGATIVE NEGATIVE Final    Comment:        The GeneXpert MRSA Assay (FDA approved for NASAL specimens only), is one component of a comprehensive MRSA colonization surveillance program. It is not intended to diagnose MRSA infection nor to guide or monitor treatment for MRSA infections. Performed at [facility], [address] [place], [place] [address]     Coagulation Studies: No results for input(s): LABPROT, INR in the last 72 hours.  Urinalysis: No results for input(s): COLORURINE, LABSPEC, PHURINE, GLUCOSEU, HGBUR, BILIRUBINUR, KETONESUR, PROTEINUR, UROBILINOGEN, NITRITE, LEUKOCYTESUR in the last 72 hours.  Invalid input(s): APPERANCEUR    Imaging: DG Abd 1 View  Result Date: 04/05/2019 CLINICAL DATA:  Nausea.  End-stage renal disease. EXAM: ABDOMEN - 1 VIEW COMPARISON:  None. FINDINGS: Two supine views. Cholecystectomy. Prior median sternotomy. Cardiomegaly. No gaseous distention of  bowel loops. No pneumatosis. Lucency about the upper abdomen is favored to be within or adjacent the pannus. Vascular calcifications. IMPRESSION: No acute findings. Electronically Signed   By: Abigail Miyamoto M.D.   On: 04/05/2019 16:10     Medications:   . sodium chloride 20 mL (03/23/19 1852)  . feeding supplement (OSMOLITE 1.5 CAL) 1,000 mL (04/03/19 1921)   . acidophilus  2 capsule Oral BID  . alteplase  2 mg Intracatheter Once  . alteplase  2 mg Intracatheter Once  . apixaban  5 mg Oral BID  . aspirin  81 mg Per Tube Daily  . B-complex with vitamin C  1 tablet Per Tube Daily  . Chlorhexidine Gluconate Cloth  6 each Topical Q0600  . epoetin (EPOGEN/PROCRIT) injection  10,000 Units Intravenous Q M,W,F-HD  . feeding supplement (PRO-STAT SUGAR FREE 64)  30 mL Per Tube Daily  . ferrous gluconate  324 mg Oral Daily  . fluticasone  2 spray Each Nare Daily  . free water  20 mL Per  Tube Q4H  . insulin aspart  0-15 Units Subcutaneous Q4H  . insulin glargine  10 Units Subcutaneous Daily  . levothyroxine  100 mcg Per Tube QAC breakfast  . mouth rinse  15 mL Mouth Rinse BID  . midodrine  10 mg Oral Q M,W,F-HD  . pantoprazole sodium  40 mg Per Tube BID   sodium chloride, acetaminophen, ipratropium-albuterol, ondansetron (ZOFRAN) IV, traMADol  Assessment/ Plan:  [name] is a 64 y.o. white female 47 year oldwith end-stage renal disease, atrial fibrillation, chronic diastolic congestive heart failure, CAD status post CABG, type 2 diabetes, hypertension, hyperlipidemia, hypothyroidism, Covid positive in July 2020, recent nursing home stay in [place],Prolonged hospitalization for MRSA bacteremia/sepsis and now with acute encephalopathy  [facility] MWF [facility]. [facility] permcath 83.5kg  1. End Stage Renal Disease on hemodialysis. Seen and examined on hemodialysis treatment. Tolerating treatment well in chair.  - MWF schedule  2. Anemia of chronic kidney disease: Hemoglobin 6.9. macrocytic.  - EPO with HD treatment.  - Discussed transfusion with Hospitalist.   3. Hypotension - midodrine  4. Secondary Hyperparathyroidism: not currently on binders. PTH 139, on 02/06/2019.   5. Encephalopathy: some improvement.     LOS: [place] 2/1/20211:54 PM

## 2019-04-06 NOTE — Care Management Important Message (Signed)
Important Message  Patient Details  Name: Jamie Arias MRN: [id number] Date of Birth: Feb 07, 1956   Medicare Important Message Given:  Yes     Dannette Barbara 04/06/2019, 11:37 AM

## 2019-04-06 NOTE — Progress Notes (Signed)
[place] [facility] [place] NAME: Jamie Arias    MR#:  [id number]  DATE OF BIRTH:  24-Dec-1955  SUBJECTIVE:  Awakes, says a few words Getting ready to go for HD in chair  REVIEW OF SYSTEMS:   Review of Systems  Unable to perform ROS: Mental acuity   Tolerating Diet: PEG feeding Tolerating PT: no--bed bound  DRUG ALLERGIES:   Allergies  Allergen Reactions  . Nsaids Other (See Comments)    Contraindicated due to kidney disease.  Marland Kitchen Doxycycline Other (See Comments)    tremor    VITALS:  Blood pressure (!) 114/50, pulse 64, temperature 98.3 F (36.8 C), temperature source Axillary, resp. rate 16, height 5' 4.02" (1.626 m), weight 83.5 kg, SpO2 100 %.  PHYSICAL EXAMINATION:   Physical Exam limited due to pt participation and MS changes  Constitutional: NAD, awake, more expression in her face today, sitting next to her husband in a chair HEENT: conjunctivae and lids normal, EOMI HD cath+ CV: RRR 2+ systolic murmur at upper sternal boarders. Distal pulses +2.  No cyanosis.   RESP: CTA B/L over anterior, normal respiratory effort, on RA GI: +BS, NTND, PEG tube in place Extremities: No effusions, edema, or tenderness in BLE SKIN: warm, dry, extensive bruising  LABORATORY PANEL:  CBC Recent Labs  Lab 04/06/19 0500  WBC 5.2  HGB 6.9*  HCT 22.5*  PLT 248    Chemistries  Recent Labs  Lab 04/06/19 0930  NA 134*  K 5.1  CL 93*  CO2 33*  GLUCOSE 159*  BUN 46*  CREATININE 3.00*  CALCIUM 8.4*   Cardiac Enzymes No results for input(s): TROPONINI in the last 168 hours. RADIOLOGY:  DG Abd 1 View  Result Date: 04/05/2019 CLINICAL DATA:  Nausea.  End-stage renal disease. EXAM: ABDOMEN - 1 VIEW COMPARISON:  None. FINDINGS: Two supine views. Cholecystectomy. Prior median sternotomy. Cardiomegaly. No gaseous distention of bowel loops. No pneumatosis. Lucency about the upper abdomen is favored to be within or adjacent the pannus.  Vascular calcifications. IMPRESSION: No acute findings. Electronically Signed   By: Abigail Miyamoto M.D.   On: 04/05/2019 16:10   ASSESSMENT AND PLAN:  [name] a 64 y.o.white femalewith end-stage renal disease on hemodialysis, atrial fibrillation, chronic diastolic congestive heart failure, coronary artery disease status post CABG, diabetes mellitus type II, hypertension, hyperlipidemia, hypothyroidism, Covid positive in July 2020, recent nursing home stay in [place]. Prolonged hospitalization for MRSA bacteremia/sepsis and now with acute encephalopathy.  Acute metabolic encephalopathy:  -mental status remains mostly unchanged, says a few words every now and then. Cannot hold meaningful conversation -MRI of brain and EEG negative. Patient remained afebrile with no leukocytosis. -B12 within normal range - IR  performed LP on 03/20/19 AM: CSF shows only 8 WBC, Protein elevated at 73, Glucose 83. Negative for Toxo. CSF Cx final -- negative. Anaerobic and fungal Cx pending   - ID, Neurology assistance   -Overall long-term prognosis seems poor.  -Palliative care consulted.  They have been following the patient and in touch with husband.  In the meantime continue medical treatment.  Sepsis secondary to MRSA bacteremia.   No obvious source.  Right arm graft was removed on 03/04/2019. -Completed vancomycin for 6-week course on 04/01/2019.   Anemia of chronic illness.   -Status post transfusion 1 unit of PRBC on 03/17/2019 -  hemoglobin stable at 6.9---transfuse today -  Iron studies consistent with anemia of chronic illness.     -  B12 within normal range -Continue EPO with dialysis.  Hypotension, chronic  Hypoalbuminemia  -consider restarting midodrine if BP drops again -Also getting IV albumin intermittently during dialysis -blood pressure currently stable  ESRD on HD (MWF).    -Currently using tunneled catheter as graft was removed due to MRSA bacteremia. -Continue with  scheduled dialysis per nephrology -patient has tolerated for our dialysis treatment sitting up.  Left arm DVT.   -Currently on Eliquis - recent diagnosis of left arm DVT on 03/03/2019.  Hypothyroidism.  Repeat TSH elevated at 17.82. -continue Increased dose of Synthroid 100. - repeat TSH in 2 to 3 weeks.  Paroxysmal A. Fib.. Currently in sinus. Amiodarone was discontinued. -Continue Eliquis  Type 2 diabetes. -Continue SSI. -Lantus 10 units daily  Chronic diastolic heart failure.  - Volume being managed by dialysis.  Bilateral decubitus ulcers.  Present on admission. -Continue with wound care. -Wound care nurse to follow sacral wound; consulted again on 03/24/19   Nutrition.  PEG placed 03/03/2019. Continue tube feeding.  Acute hypoxic respiratory failure.  Resolved.  Patient was intubated initially, extubated on 03/05/2019. -On room air now  DVT prophylaxis: Eliquis Code Status: DNR Family Communication:  discussed with husband at bedside  Ddisposition Plan: rehab Barrier to d/c: pt apparently does not have medicare days left and husband cannot afford private pay. Challenging discharge disposition!  TOTAL TIME TAKING CARE OF THIS PATIENT: *15* minutes.  >50% time spent on counselling and coordination of care  Note: This dictation was prepared with Dragon dictation along with smaller phrase technology. Any transcriptional errors that result from this process are unintentional.  [name] M.D    [facility]   CC: Primary care physician; [name], MDPatient ID: Jamie Arias, female   DOB: 1955-08-22, 64 y.o.   MRN: [id number]

## 2019-04-06 NOTE — Progress Notes (Addendum)
Nutrition Follow-up  DOCUMENTATION CODES:   Obesity unspecified  INTERVENTION:   Continue Osmolite 1.5 '@50ml'$ /hr + prostat liquid protein 30 ml daily via tube, supplement provides 100 kcal, 15 grams protein.  Free water flushes 44m q4 hours to maintain tube patency   Regimen provides 1900kcal/day, 90g/day protein, 10965mday free water.   B-complex with C daily via tube  Folic acid '1mg'$  daily via tube  Risaquad per tube BID  Discontinue Metamucil   NUTRITION DIAGNOSIS:   Increased nutrient needs related to chronic illness(ESRD on HD) as evidenced by estimated needs. Ongoing.  GOAL:   Patient will meet greater than or equal to 90% of their needs -met with tube feeds   MONITOR:   Labs, Weight trends, TF tolerance, Skin, I & O's  ASSESSMENT:   [age].o. female with end-stage renal disease on HD, atrial fibrillation, chronic diastolic congestive heart failure, CAD status post PCI, CABG,  type 2 diabetes, hypertension, hyperlipidemia, hypothyroidism, Covid positive in July 2020 admitted with AMS and found to have MRSA positive blood cultures   Pt s/p PEG placement 12/29  Pt tolerating tube feeds well at goal rate via PEG. Pt complaining of nausea and abdominal pain yesterday; Metamucil and Fibercon discontinued. Pt has not complained of any nausea or abdominal pain today. KUB wnl. Pt s/p HD today. Per chart, pt is weight stable. Of note, pt noted to have low folate on 1/14; will start supplementation.   Medications reviewed and include: riasquad, aspirin, B complex with C, ferrous gluconate, epogen, insulin, synthroid, protonix   Labs reviewed: BUN 46(H), creat 3.22(H)- 1/17 Hgb 8.4(L), Hct 26.0(L)- 1/17 cbgs- 141, 172, 180, 172, 167 x 24 hrs Folate- 4.5(L)- 1/14  Diet Order:   Diet Order            Diet NPO time specified  Diet effective now             EDUCATION NEEDS:   Not appropriate for education at this time  Skin:  Skin Assessment: Skin Integrity  Issues:(stg II right buttocks; DTI right heel; unstageable left heel)  Last BM:  2/1- type 6  Height:   Ht Readings from Last 1 Encounters:  03/04/19 5' 4.02" (1.626 m)   Weight:   Wt Readings from Last 1 Encounters:  03/31/19 83.5 kg   Ideal Body Weight:  45 kg  BMI:  Body mass index is 31.57 kg/m.  Estimated Nutritional Needs:   Kcal:  1700-1900kcal/day  Protein:  85-95g/day  Fluid:  UOP +1L  CaKoleen DistanceS, RD, LDN Pager #- [phone]#- [phone] Hours Pager: [phone]

## 2019-04-07 LAB — RENAL FUNCTION PANEL
Albumin: 2.2 g/dL — ABNORMAL LOW (ref 3.5–5.0)
Anion gap: 7 (ref 5–15)
BUN: 24 mg/dL — ABNORMAL HIGH (ref 8–23)
CO2: 31 mmol/L (ref 22–32)
Calcium: 9 mg/dL (ref 8.9–10.3)
Chloride: 97 mmol/L — ABNORMAL LOW (ref 98–111)
Creatinine, Ser: 1.89 mg/dL — ABNORMAL HIGH (ref 0.44–1.00)
GFR calc Af Amer: 32 mL/min — ABNORMAL LOW (ref >60)
GFR calc non Af Amer: 27 mL/min — ABNORMAL LOW (ref >60)
Glucose, Bld: 126 mg/dL — ABNORMAL HIGH (ref 70–99)
Phosphorus: 3.8 mg/dL (ref 2.5–4.6)
Potassium: 5 mmol/L (ref 3.5–5.1)
Sodium: 135 mmol/L (ref 135–145)

## 2019-04-07 LAB — GLUCOSE, CAPILLARY
Glucose-Capillary: 113 mg/dL — ABNORMAL HIGH (ref 70–99)
Glucose-Capillary: 117 mg/dL — ABNORMAL HIGH (ref 70–99)
Glucose-Capillary: 122 mg/dL — ABNORMAL HIGH (ref 70–99)
Glucose-Capillary: 133 mg/dL — ABNORMAL HIGH (ref 70–99)
Glucose-Capillary: 133 mg/dL — ABNORMAL HIGH (ref 70–99)
Glucose-Capillary: 78 mg/dL (ref 70–99)

## 2019-04-07 LAB — TYPE AND SCREEN
ABO/RH(D): A POS
Antibody Screen: NEGATIVE
Unit division: 0

## 2019-04-07 LAB — BPAM RBC
Blood Product Expiration Date: 202102232359
ISSUE DATE / TIME: 202102011841
Unit Type and Rh: 6200

## 2019-04-07 LAB — CBC
HCT: 29.9 % — ABNORMAL LOW (ref 36.0–46.0)
Hemoglobin: 9.5 g/dL — ABNORMAL LOW (ref 12.0–15.0)
MCH: 30.6 pg (ref 26.0–34.0)
MCHC: 31.8 g/dL (ref 30.0–36.0)
MCV: 96.5 fL (ref 80.0–100.0)
Platelets: 257 10*3/uL (ref 150–400)
RBC: 3.1 MIL/uL — ABNORMAL LOW (ref 3.87–5.11)
RDW: 19 % — ABNORMAL HIGH (ref 11.5–15.5)
WBC: 5.6 10*3/uL (ref 4.0–10.5)
nRBC: 0 % (ref 0.0–0.2)

## 2019-04-07 MED ORDER — METOCLOPRAMIDE HCL 5 MG/ML IJ SOLN
10.0000 mg | Freq: Four times a day (QID) | INTRAMUSCULAR | Status: DC
  Administered 2019-04-07 – 2019-04-16 (×31): 10 mg via INTRAVENOUS
  Filled 2019-04-07 (×31): qty 2

## 2019-04-07 MED ORDER — ONDANSETRON HCL 4 MG/2ML IJ SOLN
4.0000 mg | Freq: Once | INTRAMUSCULAR | Status: AC
  Administered 2019-04-07: 4 mg via INTRAVENOUS
  Filled 2019-04-07: qty 2

## 2019-04-07 MED ORDER — PROMETHAZINE HCL 6.25 MG/5ML PO SYRP
12.5000 mg | ORAL_SOLUTION | Freq: Three times a day (TID) | ORAL | Status: DC | PRN
  Administered 2019-04-07: 12.5 mg
  Filled 2019-04-07 (×3): qty 10

## 2019-04-07 MED ORDER — PROMETHAZINE HCL 25 MG/ML IJ SOLN
12.5000 mg | Freq: Four times a day (QID) | INTRAMUSCULAR | Status: DC | PRN
  Administered 2019-04-09 – 2019-04-20 (×3): 12.5 mg via INTRAVENOUS
  Filled 2019-04-07 (×3): qty 1

## 2019-04-07 NOTE — Progress Notes (Signed)
[facility]  ROUNDING NOTE   Subjective:   Hemodialysis treatment yesterday. Tolerated treatment well.   PRBC transfusion yesterday.   More awake and alert today. Able to answer questions and was able to recognize people.   Objective:  Vital signs in last 24 hours:  Temp:  [98.3 F (36.8 C)-98.7 F (37.1 C)] 98.7 F (37.1 C) (02/02 0343) Pulse Rate:  [64-82] 73 (02/02 0343) Resp:  [4-20] 20 (02/02 0343) BP: (73-150)/(46-69) 146/59 (02/02 0343) SpO2:  [97 %-100 %] 99 % (02/02 0343)  Weight change:  Filed Weights   03/27/19 0725 03/27/19 1045 03/31/19 1220  Weight: 82.2 kg 81 kg 83.5 kg    Intake/Output: I/O last 3 completed shifts: In: 50 [Other:50] Out: 1500 [Other:1500]   Intake/Output this shift:  No intake/output data recorded.  Physical Exam: General: NAD, laying in bed  Head: Normocephalic, atraumatic.  Eyes: Anicteric, PERRL  Neck: Supple, trachea midline  Lungs:  Clear to auscultation  Heart: Regular rate and rhythm  Abdomen:  Soft, nontender,   Extremities:  right upper extremity edema.  Neurologic: Verbal but not consistent with answers, +tremor  Skin: No lesions  Access: LIJ permcath    Basic Metabolic Panel: Recent Labs  Lab 04/03/19 1434 04/03/19 1434 04/04/19 0731 04/04/19 0731 04/05/19 0507 04/06/19 0930 04/07/19 0538  NA 132*  --  138  --  135 134* 135  K 4.8  --  5.4*  --  4.8 5.1 5.0  CL 92*  --  98  --  91* 93* 97*  CO2 34*  --  32  --  30 33* 31  GLUCOSE 136*  --  174*  --  170* 159* 126*  BUN 42*  --  22  --  34* 46* 24*  CREATININE 2.63*  --  1.74*  --  2.45* 3.00* 1.89*  CALCIUM 8.4*   < > 8.3*   < > 8.6* 8.4* 9.0  PHOS 4.1  4.0  --  3.6  --  4.4 4.6 3.8   < > = values in this interval not displayed.    Liver Function Tests: Recent Labs  Lab 04/03/19 1434 04/04/19 0731 04/05/19 0507 04/06/19 0930 04/07/19 0538  ALBUMIN 1.9* 1.9* 1.9* 1.9* 2.2*   No results for input(s): LIPASE, AMYLASE in the last  168 hours. No results for input(s): AMMONIA in the last 168 hours.  CBC: Recent Labs  Lab 04/03/19 1434 04/06/19 0500 04/07/19 0538  WBC 5.0 5.2 5.6  HGB 7.3* 6.9* 9.5*  HCT 23.3* 22.5* 29.9*  MCV 99.6 100.4* 96.5  PLT 235 248 257    Cardiac Enzymes: No results for input(s): CKTOTAL, CKMB, CKMBINDEX, TROPONINI in the last 168 hours.  BNP: Invalid input(s): POCBNP  CBG: Recent Labs  Lab 04/06/19 1659 04/06/19 1953 04/07/19 0003 04/07/19 0345 04/07/19 0732  GLUCAP 126* 93 63 113* 133*    Microbiology: Results for orders placed or performed during the hospital encounter of 02/09/19  Culture, blood (routine x 2)     Status: Abnormal   Collection Time: 02/09/19  2:29 PM   Specimen: BLOOD LEFT ARM  Result Value Ref Range Status   Specimen Description   Final    BLOOD LEFT ARM Performed at [facility], [address], [place], [place] [address]    Special Requests   Final    BOTTLES DRAWN AEROBIC AND ANAEROBIC Blood Culture adequate volume Performed at [facility], [address] [place], [place] [address]  Culture  Setup Time   Final    GRAM POSITIVE COCCI IN BOTH AEROBIC AND ANAEROBIC BOTTLES CRITICAL VALUE NOTED.  VALUE IS CONSISTENT WITH PREVIOUSLY REPORTED AND CALLED VALUE. Performed at [facility], [address] [place], [place] [address]    Culture (A)  Final    STAPHYLOCOCCUS AUREUS SUSCEPTIBILITIES PERFORMED ON PREVIOUS CULTURE WITHIN THE LAST 5 DAYS. Performed at [facility], [place] [address], [place], [place] [address]    Report Status 02/12/2019 FINAL  Final  Culture, blood (routine x 2)     Status: Abnormal   Collection Time: 02/09/19  2:38 PM   Specimen: BLOOD LEFT ARM  Result Value Ref Range Status   Specimen Description   Final    BLOOD LEFT ARM Performed at [facility], [address] [place], [place] [address]    Special Requests   Final    BOTTLES DRAWN AEROBIC AND ANAEROBIC Blood  Culture adequate volume Performed at [facility], [address] [place], [place] [address]    Culture  Setup Time   Final    GRAM POSITIVE COCCI IN BOTH AEROBIC AND ANAEROBIC BOTTLES CRITICAL RESULT CALLED TO, READ BACK BY AND VERIFIED WITH: Hubbell [id number] 02/10/2019 HNM Performed at [facility], [place] [address], [place], [place] [address]    Culture METHICILLIN RESISTANT STAPHYLOCOCCUS AUREUS (A)  Final   Report Status 02/12/2019 FINAL  Final   Organism ID, Bacteria METHICILLIN RESISTANT STAPHYLOCOCCUS AUREUS  Final      Susceptibility   Methicillin resistant staphylococcus aureus - MIC*    CIPROFLOXACIN >=8 RESISTANT Resistant     ERYTHROMYCIN >=8 RESISTANT Resistant     GENTAMICIN <=0.5 SENSITIVE Sensitive     OXACILLIN >=4 RESISTANT Resistant     TETRACYCLINE <=1 SENSITIVE Sensitive     VANCOMYCIN 1 SENSITIVE Sensitive     TRIMETH/SULFA <=10 SENSITIVE Sensitive     CLINDAMYCIN RESISTANT Resistant     RIFAMPIN <=0.5 SENSITIVE Sensitive     Inducible Clindamycin POSITIVE Resistant     * METHICILLIN RESISTANT STAPHYLOCOCCUS AUREUS  Blood Culture ID Panel (Reflexed)     Status: Abnormal   Collection Time: 02/09/19  2:38 PM  Result Value Ref Range Status   Enterococcus species NOT DETECTED NOT DETECTED Final   Listeria monocytogenes NOT DETECTED NOT DETECTED Final   Staphylococcus species DETECTED (A) NOT DETECTED Final    Comment: CRITICAL RESULT CALLED TO, READ BACK BY AND VERIFIED WITH: Hart Robinsons PHARMD [id number] 02/10/2019 HNM    Staphylococcus aureus (BCID) DETECTED (A) NOT DETECTED Final    Comment: Methicillin (oxacillin)-resistant Staphylococcus aureus (MRSA). MRSA is predictably resistant to beta-lactam antibiotics (except ceftaroline). Preferred therapy is vancomycin unless clinically contraindicated. Patient requires contact precautions if  hospitalized. CRITICAL RESULT CALLED TO, READ BACK BY AND VERIFIED WITH: Hart Robinsons PHARMD [id number] 02/10/2019  HNM    Methicillin resistance DETECTED (A) NOT DETECTED Final    Comment: CRITICAL RESULT CALLED TO, READ BACK BY AND VERIFIED WITH: Hart Robinsons PHARMD [id number] 02/10/2019 HNM    Streptococcus species NOT DETECTED NOT DETECTED Final   Streptococcus agalactiae NOT DETECTED NOT DETECTED Final   Streptococcus pneumoniae NOT DETECTED NOT DETECTED Final   Streptococcus pyogenes NOT DETECTED NOT DETECTED Final   Acinetobacter baumannii NOT DETECTED NOT DETECTED Final   Enterobacteriaceae species NOT DETECTED NOT DETECTED Final   Enterobacter cloacae complex NOT DETECTED NOT DETECTED Final   Escherichia coli NOT DETECTED NOT DETECTED Final   Klebsiella  oxytoca NOT DETECTED NOT DETECTED Final   Klebsiella pneumoniae NOT DETECTED NOT DETECTED Final   Proteus species NOT DETECTED NOT DETECTED Final   Serratia marcescens NOT DETECTED NOT DETECTED Final   Haemophilus influenzae NOT DETECTED NOT DETECTED Final   Neisseria meningitidis NOT DETECTED NOT DETECTED Final   Pseudomonas aeruginosa NOT DETECTED NOT DETECTED Final   Candida albicans NOT DETECTED NOT DETECTED Final   Candida glabrata NOT DETECTED NOT DETECTED Final   Candida krusei NOT DETECTED NOT DETECTED Final   Candida parapsilosis NOT DETECTED NOT DETECTED Final   Candida tropicalis NOT DETECTED NOT DETECTED Final    Comment: Performed at [facility], [place], [place] [address]  SARS CORONAVIRUS 2 (TAT 6-24 HRS) Nasopharyngeal Nasopharyngeal Swab     Status: None   Collection Time: 02/09/19  2:54 PM   Specimen: Nasopharyngeal Swab  Result Value Ref Range Status   SARS Coronavirus 2 NEGATIVE NEGATIVE Final    Comment: (NOTE) SARS-CoV-2 target nucleic acids are NOT DETECTED. The SARS-CoV-2 RNA is generally detectable in upper and lower respiratory specimens during the acute phase of infection. Negative results do not preclude SARS-CoV-2 infection, do not rule out co-infections with other pathogens, and should  not be used as the sole basis for treatment or other patient management decisions. Negative results must be combined with clinical observations, patient history, and epidemiological information. The expected result is Negative. Fact Sheet for Patients: SugarRoll.be Fact Sheet for Healthcare Providers: https://www.woods-mathews.com/ This test is not yet approved or cleared by the [place] FDA and  has been authorized for detection and/or diagnosis of SARS-CoV-2 by FDA under an Emergency Use Authorization (EUA). This EUA will remain  in effect (meaning this test can be used) for the duration of the COVID-19 declaration under Section 56 4(b)(1) of the Act, 21 U.S.C. section 360bbb-3(b)(1), unless the authorization is terminated or revoked sooner. Performed at [facility], [place] [address] [place], [place] [address]   MRSA PCR Screening     Status: Abnormal   Collection Time: 02/10/19  5:03 PM   Specimen: Nasopharyngeal  Result Value Ref Range Status   MRSA by PCR POSITIVE (A) NEGATIVE Final    Comment:        The GeneXpert MRSA Assay (FDA approved for NASAL specimens only), is one component of a comprehensive MRSA colonization surveillance program. It is not intended to diagnose MRSA infection nor to guide or monitor treatment for MRSA infections. RESULT CALLED TO, READ BACK BY AND VERIFIED WITH: ALEKSEY FRASER @1839  02/10/19 MJU Performed at [facility], [address] [place], [place] [address]   Culture, blood (routine x 2)     Status: Abnormal   Collection Time: 02/11/19 12:31 PM   Specimen: BLOOD  Result Value Ref Range Status   Specimen Description   Final    BLOOD PORTA CATH Performed at [facility] [address], [place], [place] [address]    Special Requests   Final    BOTTLES DRAWN AEROBIC AND ANAEROBIC Blood Culture adequate volume Performed at [facility], [address]  [place], [place] [address]    Culture  Setup Time   Final    GRAM POSITIVE COCCI ANAEROBIC BOTTLE ONLY CRITICAL RESULT CALLED TO, READ BACK BY AND VERIFIED WITH: SCOTT HALL AT [id number] 02/12/2019 [facility]  Performed at [facility], [address] [place], [place] [address]    Culture (A)  Final    STAPHYLOCOCCUS AUREUS  SUSCEPTIBILITIES PERFORMED ON PREVIOUS CULTURE WITHIN THE LAST 5 DAYS. Performed at [facility], [place] [address], [place], [place] [address]    Report Status 02/13/2019 FINAL  Final  Culture, blood (routine x 2)     Status: None   Collection Time: 02/11/19  3:57 PM   Specimen: BLOOD  Result Value Ref Range Status   Specimen Description BLOOD PORTA CATH  Final   Special Requests   Final    BOTTLES DRAWN AEROBIC AND ANAEROBIC Blood Culture adequate volume   Culture   Final    NO GROWTH 5 DAYS Performed at [facility], [address] [place], [place] [address]    Report Status 02/16/2019 FINAL  Final  CULTURE, BLOOD (ROUTINE X 2) w Reflex to ID Panel     Status: Abnormal   Collection Time: 02/13/19  2:18 PM   Specimen: BLOOD LEFT HAND  Result Value Ref Range Status   Specimen Description BLOOD LEFT HAND  Final   Special Requests   Final    BOTTLES DRAWN AEROBIC AND ANAEROBIC Blood Culture results may not be optimal due to an inadequate volume of blood received in culture bottles   Culture  Setup Time   Final    AEROBIC BOTTLE ONLY GRAM POSITIVE COCCI IN CLUSTERS CRITICAL RESULT CALLED TO, READ BACK BY AND VERIFIED WITH: Herbert Pun AT [id number] ON 02/14/2019 [place]. Performed at [facility], [place] [address], [place], [place] [address]    Culture METHICILLIN RESISTANT STAPHYLOCOCCUS AUREUS (A)  Final   Report Status 02/16/2019 FINAL  Final   Organism ID, Bacteria METHICILLIN RESISTANT STAPHYLOCOCCUS AUREUS  Final      Susceptibility   Methicillin resistant staphylococcus aureus - MIC*    CIPROFLOXACIN >=8 RESISTANT Resistant     ERYTHROMYCIN  >=8 RESISTANT Resistant     GENTAMICIN <=0.5 SENSITIVE Sensitive     OXACILLIN >=4 RESISTANT Resistant     TETRACYCLINE <=1 SENSITIVE Sensitive     VANCOMYCIN <=0.5 SENSITIVE Sensitive     TRIMETH/SULFA <=10 SENSITIVE Sensitive     CLINDAMYCIN RESISTANT Resistant     RIFAMPIN <=0.5 SENSITIVE Sensitive     Inducible Clindamycin POSITIVE Resistant     * METHICILLIN RESISTANT STAPHYLOCOCCUS AUREUS  Blood Culture ID Panel (Reflexed)     Status: Abnormal   Collection Time: 02/13/19  2:18 PM  Result Value Ref Range Status   Enterococcus species NOT DETECTED NOT DETECTED Final   Listeria monocytogenes NOT DETECTED NOT DETECTED Final   Staphylococcus species DETECTED (A) NOT DETECTED Final    Comment: CRITICAL RESULT CALLED TO, READ BACK BY AND VERIFIED WITH: ABBEY ELLINGTON AT [id number] ON 02/14/2019 [place].    Staphylococcus aureus (BCID) DETECTED (A) NOT DETECTED Final    Comment: Methicillin (oxacillin)-resistant Staphylococcus aureus (MRSA). MRSA is predictably resistant to beta-lactam antibiotics (except ceftaroline). Preferred therapy is vancomycin unless clinically contraindicated. Patient requires contact precautions if  hospitalized. CRITICAL RESULT CALLED TO, READ BACK BY AND VERIFIED WITH: Herbert Pun AT [id number] ON 02/14/2019 [place].    Methicillin resistance DETECTED (A) NOT DETECTED Final    Comment: CRITICAL RESULT CALLED TO, READ BACK BY AND VERIFIED WITH: Herbert Pun AT [id number] ON 02/14/2019 [place].    Streptococcus species NOT DETECTED NOT DETECTED Final   Streptococcus agalactiae NOT DETECTED NOT DETECTED Final   Streptococcus pneumoniae NOT DETECTED NOT DETECTED Final   Streptococcus pyogenes NOT DETECTED NOT DETECTED Final   Acinetobacter baumannii NOT DETECTED NOT DETECTED Final   Enterobacteriaceae species  NOT DETECTED NOT DETECTED Final   Enterobacter cloacae complex NOT DETECTED NOT DETECTED Final   Escherichia coli NOT DETECTED NOT DETECTED Final   Klebsiella oxytoca NOT  DETECTED NOT DETECTED Final   Klebsiella pneumoniae NOT DETECTED NOT DETECTED Final   Proteus species NOT DETECTED NOT DETECTED Final   Serratia marcescens NOT DETECTED NOT DETECTED Final   Haemophilus influenzae NOT DETECTED NOT DETECTED Final   Neisseria meningitidis NOT DETECTED NOT DETECTED Final   Pseudomonas aeruginosa NOT DETECTED NOT DETECTED Final   Candida albicans NOT DETECTED NOT DETECTED Final   Candida glabrata NOT DETECTED NOT DETECTED Final   Candida krusei NOT DETECTED NOT DETECTED Final   Candida parapsilosis NOT DETECTED NOT DETECTED Final   Candida tropicalis NOT DETECTED NOT DETECTED Final    Comment: Performed at [facility], [address] [place], [place] [address]  CULTURE, BLOOD (ROUTINE X 2) w Reflex to ID Panel     Status: None   Collection Time: 02/13/19  3:09 PM   Specimen: BLOOD  Result Value Ref Range Status   Specimen Description BLOOD BLOOD LEFT HAND  Final   Special Requests   Final    BOTTLES DRAWN AEROBIC AND ANAEROBIC Blood Culture adequate volume   Culture   Final    NO GROWTH 5 DAYS Performed at [facility], [address] [place], [place] [address]    Report Status 02/18/2019 FINAL  Final  Culture, blood (routine x 2)     Status: Abnormal   Collection Time: 02/15/19 12:49 PM   Specimen: BLOOD  Result Value Ref Range Status   Specimen Description   Final    BLOOD LT HAND Performed at [facility], [address] [place], [place] [address]    Special Requests   Final    BOTTLES DRAWN AEROBIC AND ANAEROBIC Blood Culture results may not be optimal due to an inadequate volume of blood received in culture bottles Performed at [facility], [address] [place], [place] [address]    Culture  Setup Time   Final    GRAM POSITIVE COCCI IN BOTH AEROBIC AND ANAEROBIC BOTTLES CRITICAL RESULT CALLED TO, READ BACK BY AND VERIFIED WITHEleonore Chiquito AT [id number] 02/16/2019 SDR Performed at [facility], [address] [place], [place] [address]    Culture (A)  Final    METHICILLIN RESISTANT STAPHYLOCOCCUS AUREUS STAPHYLOCOCCUS SPECIES (COAGULASE NEGATIVE) THE SIGNIFICANCE OF ISOLATING THIS ORGANISM FROM A SINGLE SET OF BLOOD CULTURES WHEN MULTIPLE SETS ARE DRAWN IS UNCERTAIN. PLEASE NOTIFY THE MICROBIOLOGY DEPARTMENT WITHIN ONE WEEK IF SPECIATION AND SENSITIVITIES ARE REQUIRED. Performed at [facility], [place] [address], [place], [place] [address]    Report Status 02/18/2019 FINAL  Final   Organism ID, Bacteria METHICILLIN RESISTANT STAPHYLOCOCCUS AUREUS  Final      Susceptibility   Methicillin resistant staphylococcus aureus - MIC*    CIPROFLOXACIN >=8 RESISTANT Resistant     ERYTHROMYCIN >=8 RESISTANT Resistant     GENTAMICIN <=0.5 SENSITIVE Sensitive     OXACILLIN >=4 RESISTANT Resistant     TETRACYCLINE <=1 SENSITIVE Sensitive     VANCOMYCIN <=0.5 SENSITIVE Sensitive     TRIMETH/SULFA <=10 SENSITIVE Sensitive     CLINDAMYCIN RESISTANT Resistant     RIFAMPIN <=0.5 SENSITIVE Sensitive     Inducible Clindamycin POSITIVE Resistant     * METHICILLIN RESISTANT STAPHYLOCOCCUS AUREUS  Aerobic Culture (superficial specimen)     Status: None   Collection Time: 02/17/19  1:31 PM   Specimen: Heel; Wound  Result Value Ref Range Status   Specimen Description   Final    HEEL Performed at [facility], [address], [place], [place] [address]    Special Requests   Final    NONE Performed at [facility], [address] [place], [place] [address]    Gram Stain   Final    NO WBC SEEN MODERATE GRAM POSITIVE COCCI IN PAIRS    Culture   Final    ABUNDANT METHICILLIN RESISTANT STAPHYLOCOCCUS AUREUS ABUNDANT DIPHTHEROIDS(CORYNEBACTERIUM SPECIES) Standardized susceptibility testing for this organism is not available. Performed at [facility], [place] [address], [place], [place] [address]    Report Status 02/19/2019 FINAL  Final   Organism ID,  Bacteria METHICILLIN RESISTANT STAPHYLOCOCCUS AUREUS  Final      Susceptibility   Methicillin resistant staphylococcus aureus - MIC*    CIPROFLOXACIN >=8 RESISTANT Resistant     ERYTHROMYCIN >=8 RESISTANT Resistant     GENTAMICIN <=0.5 SENSITIVE Sensitive     OXACILLIN >=4 RESISTANT Resistant     TETRACYCLINE <=1 SENSITIVE Sensitive     VANCOMYCIN 1 SENSITIVE Sensitive     TRIMETH/SULFA <=10 SENSITIVE Sensitive     CLINDAMYCIN RESISTANT Resistant     RIFAMPIN <=0.5 SENSITIVE Sensitive     Inducible Clindamycin POSITIVE Resistant     * ABUNDANT METHICILLIN RESISTANT STAPHYLOCOCCUS AUREUS  CULTURE, BLOOD (ROUTINE X 2) w Reflex to ID Panel     Status: None   Collection Time: 02/18/19 11:29 PM   Specimen: BLOOD  Result Value Ref Range Status   Specimen Description BLOOD LEFT ARM  Final   Special Requests   Final    BOTTLES DRAWN AEROBIC AND ANAEROBIC Blood Culture adequate volume   Culture   Final    NO GROWTH 5 DAYS Performed at [facility], [address] [place], [place] [address]    Report Status 02/23/2019 FINAL  Final  CULTURE, BLOOD (ROUTINE X 2) w Reflex to ID Panel     Status: None   Collection Time: 02/18/19 11:29 PM   Specimen: BLOOD  Result Value Ref Range Status   Specimen Description BLOOD LEFT ARM  Final   Special Requests   Final    BOTTLES DRAWN AEROBIC AND ANAEROBIC Blood Culture adequate volume   Culture   Final    NO GROWTH 5 DAYS Performed at [facility], [address] [place], [place] [address]    Report Status 02/23/2019 FINAL  Final  Aerobic/Anaerobic Culture (surgical/deep wound)     Status: None   Collection Time: 03/04/19 11:04 AM   Specimen: PATH Other; Tissue  Result Value Ref Range Status   Specimen Description   Final    ARTERY Performed at [facility], [address], [place], [place] [address]    Special Requests   Final    RIGHT AV GRAFT AND STENTS CULTURE Performed at [facility], [address] [place], [place] [address]    Gram Stain   Final    RARE WBC PRESENT, PREDOMINANTLY PMN NO ORGANISMS SEEN    Culture   Final    No growth aerobically or anaerobically. Performed at [facility], [place] [address], [place],  [address]    Report Status 03/09/2019 FINAL  Final  Aerobic/Anaerobic Culture (surgical/deep wound)     Status: None   Collection Time: 03/04/19 11:29 AM   Specimen: PATH Other; Tissue  Result  Value Ref Range Status   Specimen Description   Final    ARTERY Performed at [facility], [address] [place], [place] [address]    Special Requests   Final    FLUID AROUND AV GRAFT Performed at [facility], [address] [place], [place] [address]    Gram Stain   Final    FEW WBC PRESENT, PREDOMINANTLY PMN NO ORGANISMS SEEN    Culture   Final    No growth aerobically or anaerobically. Performed at [facility], [place] [address] [place], [place] [address]    Report Status 03/09/2019 FINAL  Final  Wet prep, genital     Status: Abnormal   Collection Time: 03/16/19 10:01 AM  Result Value Ref Range Status   Yeast Wet Prep HPF POC PRESENT (A) NONE SEEN Final   Trich, Wet Prep NONE SEEN NONE SEEN Final   Clue Cells Wet Prep HPF POC NONE SEEN NONE SEEN Final   WBC, Wet Prep HPF POC MANY (A) NONE SEEN Final   Sperm NONE SEEN  Final    Comment: Performed at [facility], [address] [place], [place] [address]  Aerobic Culture (superficial specimen)     Status: None   Collection Time: 03/17/19  5:13 PM   Specimen: Vaginal Fluid  Result Value Ref Range Status   Specimen Description   Final    VAGINA Performed at [facility], [address], [place], [place] [address]    Special Requests   Final    NONE Performed at [facility], [address] [place], [place] [address]    Gram Stain   Final    MODERATE WBC PRESENT,BOTH PMN AND MONONUCLEAR FEW GRAM POSITIVE COCCI IN PAIRS FEW GRAM  NEGATIVE RODS Performed at [facility], [place] [address] [place], [place] [address]    Culture   Final    ABUNDANT ESCHERICHIA COLI MODERATE KLEBSIELLA PNEUMONIAE    Report Status 03/21/2019 FINAL  Final   Organism ID, Bacteria ESCHERICHIA COLI  Final   Organism ID, Bacteria KLEBSIELLA PNEUMONIAE  Final      Susceptibility   Escherichia coli - MIC*    AMPICILLIN 4 SENSITIVE Sensitive     CEFAZOLIN <=4 SENSITIVE Sensitive     CEFEPIME <=0.12 SENSITIVE Sensitive     CEFTAZIDIME <=1 SENSITIVE Sensitive     CEFTRIAXONE <=0.25 SENSITIVE Sensitive     CIPROFLOXACIN <=0.25 SENSITIVE Sensitive     GENTAMICIN <=1 SENSITIVE Sensitive     IMIPENEM <=0.25 SENSITIVE Sensitive     TRIMETH/SULFA <=20 SENSITIVE Sensitive     AMPICILLIN/SULBACTAM <=2 SENSITIVE Sensitive     PIP/TAZO <=4 SENSITIVE Sensitive     * ABUNDANT ESCHERICHIA COLI   Klebsiella pneumoniae - MIC*    AMPICILLIN >=32 RESISTANT Resistant     CEFAZOLIN <=4 SENSITIVE Sensitive     CEFEPIME <=0.12 SENSITIVE Sensitive     CEFTAZIDIME <=1 SENSITIVE Sensitive     CEFTRIAXONE <=0.25 SENSITIVE Sensitive     CIPROFLOXACIN <=0.25 SENSITIVE Sensitive     GENTAMICIN <=1 SENSITIVE Sensitive     IMIPENEM <=0.25 SENSITIVE Sensitive     TRIMETH/SULFA <=20 SENSITIVE Sensitive     AMPICILLIN/SULBACTAM 4 SENSITIVE Sensitive     PIP/TAZO <=4 SENSITIVE Sensitive     * MODERATE KLEBSIELLA PNEUMONIAE  Anaerobic culture     Status: None   Collection Time: 03/20/19 12:40 PM   Specimen: PATH Cytology CSF; Cerebrospinal Fluid  Result Value Ref  Range Status   Specimen Description   Final    CSF Performed at [facility], [address] [place], [place] [address]    Special Requests   Final    NONE Performed at [facility], [address] [place], [place] [address]    Gram Stain   Final    WBC PRESENT, PREDOMINANTLY MONONUCLEAR NO ORGANISMS SEEN CYTOSPIN SMEAR    Culture   Final    NO ANAEROBES  ISOLATED Performed at [facility], [place] [address] [place], [place] [address]    Report Status 03/27/2019 FINAL  Final  CSF culture     Status: None   Collection Time: 03/20/19 12:40 PM   Specimen: PATH Cytology CSF; Cerebrospinal Fluid  Result Value Ref Range Status   Specimen Description   Final    CSF Performed at [facility], [address] [place], [place] [address]    Special Requests   Final    NONE Performed at [facility], [address] [place], [place] [address]    Gram Stain   Final    WBC PRESENT, PREDOMINANTLY MONONUCLEAR NO ORGANISMS SEEN CYTOSPIN SMEAR    Culture   Final    NO GROWTH 3 DAYS Performed at [facility], [place] [address] [place], [place] [address]    Report Status 03/24/2019 FINAL  Final  Fungus Culture With Stain     Status: None (Preliminary result)   Collection Time: 03/20/19 12:40 PM   Specimen: PATH Cytology CSF; Cerebrospinal Fluid  Result Value Ref Range Status   Fungus Stain Final report  Final    Comment: (NOTE) Performed At: [facility] [address] [place], [place] [id number] Rush Farmer MD [id number]    Fungus (Mycology) Culture PENDING  Incomplete   Fungal Source CSF  Final    Comment: Performed at [facility], [address] [place], [place] [address]  Fungus Culture Result     Status: None   Collection Time: 03/20/19 12:40 PM  Result Value Ref Range Status   Result 1 Comment  Final    Comment: (NOTE) KOH/Calcofluor preparation:  no fungus observed. Performed At: [facility] [place], [place] [id number] Rush Farmer MD [id number]   MRSA PCR Screening     Status: None   Collection Time: 04/03/19  8:15 PM   Specimen: Nasopharyngeal  Result Value Ref Range Status   MRSA by PCR NEGATIVE NEGATIVE Final    Comment:        The GeneXpert MRSA Assay (FDA approved for NASAL specimens only), is one component of a comprehensive MRSA  colonization surveillance program. It is not intended to diagnose MRSA infection nor to guide or monitor treatment for MRSA infections. Performed at [facility], [address] [place],  [address]     Coagulation Studies: No results for input(s): LABPROT, INR in the last 72 hours.  Urinalysis: No results for input(s): COLORURINE, LABSPEC, PHURINE, GLUCOSEU, HGBUR, BILIRUBINUR, KETONESUR, PROTEINUR, UROBILINOGEN, NITRITE, LEUKOCYTESUR in the last 72 hours.  Invalid input(s): APPERANCEUR    Imaging: DG Abd 1 View  Result Date: 04/05/2019 CLINICAL DATA:  Nausea.  End-stage renal disease. EXAM: ABDOMEN - 1 VIEW COMPARISON:  None. FINDINGS: Two supine views. Cholecystectomy. Prior median sternotomy. Cardiomegaly. No gaseous distention of bowel loops. No pneumatosis. Lucency about the upper abdomen is favored to be within or adjacent the pannus. Vascular calcifications. IMPRESSION: No acute findings. Electronically Signed   By: Marylyn Ishihara  Jobe Igo M.D.   On: 04/05/2019 16:10     Medications:   . sodium chloride 20 mL (03/23/19 1852)  . feeding supplement (OSMOLITE 1.5 CAL) 1,000 mL (04/03/19 1921)   . acidophilus  2 capsule Oral BID  . alteplase  2 mg Intracatheter Once  . alteplase  2 mg Intracatheter Once  . apixaban  5 mg Oral BID  . aspirin  81 mg Per Tube Daily  . B-complex with vitamin C  1 tablet Per Tube Daily  . Chlorhexidine Gluconate Cloth  6 each Topical Q0600  . epoetin (EPOGEN/PROCRIT) injection  10,000 Units Intravenous Q M,W,F-HD  . feeding supplement (PRO-STAT SUGAR FREE 64)  30 mL Per Tube Daily  . ferrous gluconate  324 mg Oral Daily  . fluticasone  2 spray Each Nare Daily  . folic acid  1 mg Per Tube Daily  . free water  20 mL Per Tube Q4H  . insulin aspart  0-15 Units Subcutaneous Q4H  . insulin glargine  10 Units Subcutaneous Daily  . levothyroxine  100 mcg Per Tube QAC breakfast  . mouth rinse  15 mL Mouth Rinse BID  . midodrine  10 mg Oral  Q M,W,F-HD  . pantoprazole sodium  40 mg Per Tube BID   sodium chloride, acetaminophen, ipratropium-albuterol, ondansetron (ZOFRAN) IV, traMADol  Assessment/ Plan:  Jamie Arias is a 64 y.o. white female 33 year oldwith end-stage renal disease, atrial fibrillation, chronic diastolic congestive heart failure, CAD status post CABG, type 2 diabetes, hypertension, hyperlipidemia, hypothyroidism, Covid positive in July 2020, recent nursing home stay in [place],Prolonged hospitalization for MRSA bacteremia/sepsis and now with acute encephalopathy  [facility] MWF [facility]. LIJ permcath 83.5kg  1. End Stage Renal Disease on hemodialysis. Seen and examined on hemodialysis treatment. Tolerating treatment well in chair.  - MWF schedule  2. Anemia of chronic kidney disease: status post PRBC transfusion on 2/1 - EPO with HD treatment.   3. Hypotension: 146/59 - midodrine  4. Secondary Hyperparathyroidism: not currently on binders. PTH 139, on 02/06/2019.   5. Encephalopathy: some improvement.     LOS: 74 Davie Claud 2/2/20219:48 AM

## 2019-04-07 NOTE — Progress Notes (Signed)
[facility] [place] NAME: Jamie Arias    MR#:  [id number]  DATE OF BIRTH:  1955/05/24  SUBJECTIVE:  Awakes, says a few words Wants to drink water, get some Ice  REVIEW OF SYSTEMS:   Review of Systems  Unable to perform ROS: Mental acuity   Tolerating Diet: PEG feeding Tolerating PT: no--bed bound  DRUG ALLERGIES:   Allergies  Allergen Reactions  . Nsaids Other (See Comments)    Contraindicated due to kidney disease.  Marland Kitchen Doxycycline Other (See Comments)    tremor    VITALS:  Blood pressure (!) 146/57, pulse 68, temperature 97.9 F (36.6 C), temperature source Oral, resp. rate (!) 24, height 5' 4.02" (1.626 m), weight 83.5 kg, SpO2 97 %.  PHYSICAL EXAMINATION:   Physical Exam limited due to pt participation and MS changes  Constitutional: NAD, awake, more expression in her face today, sitting next to her husband in a chair, saying a few words HEENT: conjunctivae and lids normal, EOMI HD cath+ CV: RRR 2+ systolic murmur at upper sternal boarders. Distal pulses +2.  No cyanosis.   RESP: CTA B/L over anterior, normal respiratory effort, on RA GI: +BS, NTND, PEG tube in place Extremities: No effusions, edema, or tenderness in BLE SKIN: warm, dry, extensive bruising  LABORATORY PANEL:  CBC Recent Labs  Lab 04/07/19 0538  WBC 5.6  HGB 9.5*  HCT 29.9*  PLT 257    Chemistries  Recent Labs  Lab 04/07/19 0538  NA 135  K 5.0  CL 97*  CO2 31  GLUCOSE 126*  BUN 24*  CREATININE 1.89*  CALCIUM 9.0   Cardiac Enzymes No results for input(s): TROPONINI in the last 168 hours. RADIOLOGY:  DG Abd 1 View  Result Date: 04/05/2019 CLINICAL DATA:  Nausea.  End-stage renal disease. EXAM: ABDOMEN - 1 VIEW COMPARISON:  None. FINDINGS: Two supine views. Cholecystectomy. Prior median sternotomy. Cardiomegaly. No gaseous distention of bowel loops. No pneumatosis. Lucency about the upper abdomen is favored to be within or adjacent the  pannus. Vascular calcifications. IMPRESSION: No acute findings. Electronically Signed   By: Abigail Miyamoto M.D.   On: 04/05/2019 16:10   ASSESSMENT AND PLAN:  [name] a 64 y.o.white femalewith end-stage renal disease on hemodialysis, atrial fibrillation, chronic diastolic congestive heart failure, coronary artery disease status post CABG, diabetes mellitus type II, hypertension, hyperlipidemia, hypothyroidism, Covid positive in July 2020, recent nursing home stay in [place]. Prolonged hospitalization for MRSA bacteremia/sepsis and now with acute encephalopathy.  Acute metabolic encephalopathy:  -mental status improving slowly with patient saying a few words every now and then. Cannot hold meaningful conversation -MRI of brain and EEG negative. Patient remained afebrile with no leukocytosis. -B12 within normal range - IR  performed LP on 03/20/19 AM: CSF shows only 8 WBC, Protein elevated at 73, Glucose 83. Negative for Toxo. CSF Cx final -- negative.  - ID, Neurology assistance   -Overall long-term prognosis seems poor.  -Palliative care consulted  Sepsis secondary to MRSA bacteremia.   -  Right arm graft was removed on 03/04/2019. -Completed vancomycin for 6-week course on 04/01/2019.   Anemia of chronic illness.   -Status post transfusion 1 unit of PRBC on 03/17/2019 -  hemoglobin stable at 6.9---transfused on 04/07/19--9.5 -  Iron studies consistent with anemia of chronic illness.     -B12 within normal range -Continue EPO with dialysis.  Hypotension, chronic  Hypoalbuminemia  -on midodrine M-W-F -blood  pressure currently stable  ESRD on HD (MWF).    -Currently using tunneled catheter as graft was removed due to MRSA bacteremia. -Continue with scheduled dialysis per nephrology -patient has tolerated for our dialysis treatment sitting up.  Left arm DVT.   -Currently on Eliquis - recent diagnosis of left arm DVT on 03/03/2019.  Hypothyroidism.  Repeat TSH  elevated at 17.82. -continue Increased dose of Synthroid 100. - repeat TSH in 2 to 3 weeks.  Paroxysmal A. Fib.. Currently in sinus. Amiodarone was discontinued. -Continue Eliquis  Type 2 diabetes. -Continue SSI. -Lantus 10 units daily  Chronic diastolic heart failure.  - Volume being managed by dialysis.  Bilateral decubitus ulcers.  Present on admission. -Continue with wound care. -Wound care nurse to follow sacral wound; consulted again on 03/24/19   Nutrition.  PEG placed 03/03/2019. Continue tube feeding. Speech theryapy to revisit to see if pt can start swallow eval now that MS improving  Acute hypoxic respiratory failure.  Resolved.  Patient was intubated initially, extubated on 03/05/2019. -On room air now  DVT prophylaxis: Eliquis Code Status: DNR Family Communication:  discussed with husband at bedside  Ddisposition Plan: rehab Barrier to d/c: pt apparently does not have medicare days left and husband cannot afford private pay. Challenging discharge disposition!  TOTAL TIME TAKING CARE OF THIS PATIENT: *20* minutes.  >50% time spent on counselling and coordination of care  Note: This dictation was prepared with Dragon dictation along with smaller phrase technology. Any transcriptional errors that result from this process are unintentional.  [name] M.D    [facility]   CC: Primary care physician; [name], MDPatient ID: Gevena Arias, female   DOB: 1956-02-16, 64 y.o.   MRN: [id number]

## 2019-04-07 NOTE — Progress Notes (Signed)
Physical Therapy Treatment Patient Details Name: Jamie Arias MRN: [id number] DOB: 04-Jan-1956 Today's Date: 04/07/2019    History of Present Illness Pt is a 64 y.o. female presenting to hospital 02/09/19 with AMS.  Pt admitted with MRSA sepsis and bacteremia with hypotension, toxic metabolic encephalopathy, ESRD on HD, hypokalemia, elevated troponin, a-fib, and anemia of CKD.  LUE DVT diagnosed 03/03/19.  Of note pt had been intubated but was extubated 03/04/20.  PMH includes ESRD on HD, a-fib, chronic diastolic CHF, CAD s/p CABG, DM, htn, COVID (+) July 2020.    PT Comments    Patient with improved alertness this date; visually acknowledges and tracks therapist.  Follows some very simple verbal commands (responds better to husband's voice) with increased time for processing; does make simple needs known at times.  Remains globally weak and deconditioned, requiring total assist for all mobility efforts, therex and repositioning; does show some degree of active effort with L UE, R hand this date.  Will continue POC at this time given improvement in level of alertness.  Will continue to carefully monitor ability to participate/progress throughout continued POC.  Goals set at initial evaluation remain appropriate for patient at this time.    Follow Up Recommendations  SNF     Equipment Recommendations       Recommendations for Other Services       Precautions / Restrictions Precautions Precautions: Fall Precaution Comments: L IJ HD permcath; NPO; PEG tube; aspiration precautions Restrictions Weight Bearing Restrictions: No    Mobility  Bed Mobility Overal bed mobility: Needs Assistance Bed Mobility: Rolling Rolling: +2 for physical assistance;Total assist         General bed mobility comments: limited ability to actively assist with extremities  Transfers                 General transfer comment: unsafe/unable  Ambulation/Gait             General Gait Details:  unsafe/unable   Stairs             Wheelchair Mobility    Modified Rankin (Stroke Patients Only)       Balance                                            Cognition Arousal/Alertness: Awake/alert Behavior During Therapy: Flat affect Overall Cognitive Status: Impaired/Different from baseline                                 General Comments: does visually track and acknowledge therapist/husband, verbally responds (appropriately) to questions; increased time for processing      Exercises Other Exercises Other Exercises: Passive ROM to bilat UE/LEs for strength/flexibility; mild act assist effort noted with L UE and R hand at times. Other Exercises: Cervical rotation bilat, on command, x4 each direction. Improved positioning and movement this date.  Did educate/encourage husband for positioning to R of patient when visiting at times to encourage R rotation/attention as able. Other Exercises: Bilat UEs propped on pillows for edema control; towel rolls placed under palms for more neutral positioning. Other Exercises: Rolling bilat, dep +2 for mangement of incontinent bowel/bladder.  Nausea/dry heaving noted with movement in bed.    General Comments        Pertinent Vitals/Pain Pain Assessment: Faces Faces Pain Scale:  Hurts even more Pain Descriptors / Indicators: Grimacing Pain Intervention(s): Limited activity within patient's tolerance;Monitored during session;Repositioned    Home Living                      Prior Function            PT Goals (current goals can now be found in the care plan section) Acute Rehab PT Goals Patient Stated Goal: to improve mobility PT Goal Formulation: With patient/family Time For Goal Achievement: 04/11/19 Potential to Achieve Goals: Fair    Frequency    Min 2X/week      PT Plan Current plan remains appropriate    Co-evaluation              AM-PAC PT "6 Clicks" Mobility    Outcome Measure  Help needed turning from your back to your side while in a flat bed without using bedrails?: Total Help needed moving from lying on your back to sitting on the side of a flat bed without using bedrails?: Total Help needed moving to and from a bed to a chair (including a wheelchair)?: Total Help needed standing up from a chair using your arms (e.g., wheelchair or bedside chair)?: Total Help needed to walk in hospital room?: Total Help needed climbing 3-5 steps with a railing? : Total 6 Click Score: 6    End of Session   Activity Tolerance: Patient tolerated treatment well Patient left: in bed;with call bell/phone within reach;with bed alarm set;with family/visitor present Nurse Communication: Mobility status PT Visit Diagnosis: Other abnormalities of gait and mobility (R26.89);Muscle weakness (generalized) (M62.81);Difficulty in walking, not elsewhere classified (R26.2)     Time: [id number] PT Time Calculation (min) (ACUTE ONLY): 39 min  Charges:  $Therapeutic Exercise: 8-22 mins $Therapeutic Activity: 23-37 mins                     Orie Cuttino H. Owens Shark, PT, DPT, NCS 04/07/19, 5:23 PM [phone]

## 2019-04-07 NOTE — Progress Notes (Signed)
Patient complaining of abdominal pain and nausea. Dr. Posey Pronto ordered phenergan 12.5 mg via peg tube, but patient vomited after given meds through tube. I was instructed by Dr. Marius Ditch to hold tube feedings if patient is nauseous after lowering rate of tube feeding to 20 mL/hr. I stopped the tube feeding after patient vomited. Will continue to monitor.  Christene Slates

## 2019-04-08 LAB — MISC LABCORP TEST (SEND OUT)
Labcorp test code: 9985
Misc LabCorp result: UNDETERMINED

## 2019-04-08 LAB — CBC
HCT: 27 % — ABNORMAL LOW (ref 36.0–46.0)
Hemoglobin: 8.7 g/dL — ABNORMAL LOW (ref 12.0–15.0)
MCH: 31.1 pg (ref 26.0–34.0)
MCHC: 32.2 g/dL (ref 30.0–36.0)
MCV: 96.4 fL (ref 80.0–100.0)
Platelets: 243 10*3/uL (ref 150–400)
RBC: 2.8 MIL/uL — ABNORMAL LOW (ref 3.87–5.11)
RDW: 17.6 % — ABNORMAL HIGH (ref 11.5–15.5)
WBC: 5.1 10*3/uL (ref 4.0–10.5)
nRBC: 0 % (ref 0.0–0.2)

## 2019-04-08 LAB — RENAL FUNCTION PANEL
Albumin: 2 g/dL — ABNORMAL LOW (ref 3.5–5.0)
Anion gap: 8 (ref 5–15)
BUN: 22 mg/dL (ref 8–23)
CO2: 34 mmol/L — ABNORMAL HIGH (ref 22–32)
Calcium: 8.3 mg/dL — ABNORMAL LOW (ref 8.9–10.3)
Chloride: 99 mmol/L (ref 98–111)
Creatinine, Ser: 1.74 mg/dL — ABNORMAL HIGH (ref 0.44–1.00)
GFR calc Af Amer: 35 mL/min — ABNORMAL LOW (ref >60)
GFR calc non Af Amer: 30 mL/min — ABNORMAL LOW (ref >60)
Glucose, Bld: 88 mg/dL (ref 70–99)
Phosphorus: 2.9 mg/dL (ref 2.5–4.6)
Potassium: 3.8 mmol/L (ref 3.5–5.1)
Sodium: 141 mmol/L (ref 135–145)

## 2019-04-08 LAB — GLUCOSE, CAPILLARY
Glucose-Capillary: 125 mg/dL — ABNORMAL HIGH (ref 70–99)
Glucose-Capillary: 127 mg/dL — ABNORMAL HIGH (ref 70–99)
Glucose-Capillary: 56 mg/dL — ABNORMAL LOW (ref 70–99)
Glucose-Capillary: 60 mg/dL — ABNORMAL LOW (ref 70–99)
Glucose-Capillary: 70 mg/dL (ref 70–99)
Glucose-Capillary: 75 mg/dL (ref 70–99)
Glucose-Capillary: 78 mg/dL (ref 70–99)
Glucose-Capillary: 80 mg/dL (ref 70–99)
Glucose-Capillary: 94 mg/dL (ref 70–99)

## 2019-04-08 MED ORDER — DEXTROSE-NACL 5-0.9 % IV SOLN: INTRAVENOUS | Status: DC

## 2019-04-08 MED ORDER — DEXTROSE 50 % IV SOLN
1.0000 | Freq: Once | INTRAVENOUS | Status: AC
  Administered 2019-04-08: 50 mL via INTRAVENOUS

## 2019-04-08 MED ORDER — DEXTROSE 50 % IV SOLN
INTRAVENOUS | Status: AC
  Filled 2019-04-08: qty 50

## 2019-04-08 MED ORDER — OSMOLITE 1.5 CAL PO LIQD: 1000.0000 mL | ORAL | Status: DC

## 2019-04-08 MED ORDER — NAPHAZOLINE-GLYCERIN 0.012-0.2 % OP SOLN
1.0000 [drp] | Freq: Four times a day (QID) | OPHTHALMIC | Status: DC | PRN
  Administered 2019-04-09: 1 [drp] via OPHTHALMIC
  Filled 2019-04-08 (×2): qty 15

## 2019-04-08 MED ORDER — OSMOLITE 1.5 CAL PO LIQD
1000.0000 mL | ORAL | Status: DC
  Administered 2019-04-08 – 2019-04-12 (×3): 1000 mL

## 2019-04-08 NOTE — Progress Notes (Signed)
PT Cancellation Note  Patient Details Name: Jamie Arias MRN: [id number] DOB: 09-12-1955   Cancelled Treatment:    Reason Eval/Treat Not Completed: Patient at procedure or test/unavailable(Patient currently off unit for dialysis.  Will re-attempt at later time/date as medically appropriate and available.)   Andrey Hoobler H. Owens Shark, PT, DPT, NCS 04/08/19, 11:21 AM [phone]

## 2019-04-08 NOTE — Progress Notes (Signed)
Patient's tube feed on hold due to continued nausea and vomiting per GI. GI to see patient, but no consult ordered. Per Dr. Dwyane Dee restart tube feed at 10 mL and increase by 10 mL every four hours to meet goal of 40 mL.  Fuller Mandril, RN

## 2019-04-08 NOTE — Progress Notes (Signed)
This note also relates to the following rows which could not be included: Pulse Rate - Cannot attach notes to unvalidated device data BP - Cannot attach notes to unvalidated device data  Hd started  

## 2019-04-08 NOTE — Progress Notes (Signed)
[facility] [place] NAME: Kalysta Humphrey    MR#:  [id number]  DATE OF BIRTH:  [birth year]  SUBJECTIVE:  No overnight issues, seen and examined at bedside.  Patient is AO x1-2, confused and unable to offer any complaints. Patient was shaking a little bit    REVIEW OF SYSTEMS:   Review of Systems  Unable to perform ROS: Mental acuity   Tolerating Diet: PEG feeding Tolerating PT: no--bed bound  DRUG ALLERGIES:   Allergies  Allergen Reactions  . Nsaids Other (See Comments)    Contraindicated due to kidney disease.  Marland Kitchen Doxycycline Other (See Comments)    tremor    VITALS:  Blood pressure (!) 147/52, pulse 73, temperature 98.6 F (37 C), temperature source Axillary, resp. rate 16, height 5' 4.02" (1.626 m), weight 84.6 kg, SpO2 99 %.  PHYSICAL EXAMINATION:   Physical Exam limited due to pt participation and MS changes  Constitutional: NAD, awake, more expression in her face today, sitting next to her husband in a chair, saying a few words HEENT: conjunctivae and lids normal, EOMI HD cath+ CV: RRR 2+ systolic murmur at upper sternal boarders. Distal pulses +2.  No cyanosis.   RESP: CTA B/L over anterior, normal respiratory effort, on RA GI: +BS, NTND, PEG tube in place Extremities: Pressure ulcers bilateral feet, right upper arm swelling, bandage intact  SKIN: warm, dry, extensive bruising  LABORATORY PANEL:  CBC Recent Labs  Lab 04/08/19 1201  WBC 5.1  HGB 8.7*  HCT 27.0*  PLT 243    Chemistries  Recent Labs  Lab 04/08/19 1201  NA 141  K 3.8  CL 99  CO2 34*  GLUCOSE 88  BUN 22  CREATININE 1.74*  CALCIUM 8.3*   Cardiac Enzymes No results for input(s): TROPONINI in the last 168 hours. RADIOLOGY:  No results found. ASSESSMENT AND PLAN:  Ms.Mahlet F Kinneyis a 64 y.o.white femalewith end-stage renal disease on hemodialysis, atrial fibrillation, chronic diastolic congestive heart failure, coronary artery  disease status post CABG, diabetes mellitus type II, hypertension, hyperlipidemia, hypothyroidism, Covid positive in July 2020, recent nursing home stay in [place]. Prolonged hospitalization for MRSA bacteremia/sepsis and now with acute encephalopathy.  Acute metabolic encephalopathy:  -mental status improving slowly with patient saying a few words every now and then. Cannot hold meaningful conversation -MRI of brain and EEG negative. Patient remained afebrile with no leukocytosis. -B12 within normal range - IR  performed LP on 03/20/19 AM: CSF shows only 8 WBC, Protein elevated at 73, Glucose 83. Negative for Toxo. CSF Cx final -- negative.  - ID, Neurology assistance   -Overall long-term prognosis seems poor.  -Palliative care consulted  Sepsis secondary to MRSA bacteremia.   -  Right arm graft was removed on 03/04/2019. -Completed vancomycin for 6-week course on 04/01/2019.   Anemia of chronic illness.   -Status post transfusion 1 unit of PRBC on 03/17/2019 -  hemoglobin stable at 6.9---transfused on 04/07/19--9.5 -  Iron studies consistent with anemia of chronic illness.     -B12 within normal range -Continue EPO with dialysis.  Hypotension, chronic  Hypoalbuminemia  -on midodrine M-W-F -blood pressure currently stable  ESRD on HD (MWF).    -Currently using tunneled catheter as graft was removed due to MRSA bacteremia. -Continue with scheduled dialysis per nephrology -patient has tolerated for our dialysis treatment sitting up.  Left arm DVT.   -Currently on Eliquis - recent diagnosis of left arm DVT on  03/03/2019.  Hypothyroidism.  Repeat TSH elevated at 17.82. -continue Increased dose of Synthroid 100. - repeat TSH in 2 to 3 weeks.  Paroxysmal A. Fib.. Currently in sinus. Amiodarone was discontinued. -Continue Eliquis  Type 2 diabetes. -Continue SSI. -Lantus 10 units daily  Chronic diastolic heart failure.  - Volume being managed by dialysis.  Bilateral  decubitus ulcers.  Present on admission. -Continue with wound care. -Wound care nurse to follow sacral wound; consulted again on 03/24/19   Nutrition.  PEG placed 03/03/2019. Continue tube feeding. Speech theryapy to revisit to see if pt can start swallow eval now that MS improving  Acute hypoxic respiratory failure.  Resolved.  Patient was intubated initially, extubated on 03/05/2019. -On room air now  DVT prophylaxis: Eliquis Code Status: DNR Family Communication:  discussed with husband at bedside  Ddisposition Plan: rehab Barrier to d/c: pt apparently does not have medicare days left and husband cannot afford private pay. Challenging discharge disposition!  TOTAL TIME TAKING CARE OF THIS PATIENT: *20* minutes.  >50% time spent on counselling and coordination of care  Note: This dictation was prepared with Dragon dictation along with smaller phrase technology. Any transcriptional errors that result from this process are unintentional.  Val Riles M.D    [facility]   CC: Primary care physician; [name], MDPatient ID: [name], female   DOB: 06-Feb-1956, 64 y.o.   MRN: [id number]

## 2019-04-08 NOTE — Plan of Care (Signed)
Patient has been complaining of abdominal pain and nausea for last few days. Tube feeds currently on hold per GI. Patient has only received scheduled medications and flushes through PEG tube today. Patient denies any nausea and has not received antiemetics today, only pain medication.

## 2019-04-08 NOTE — Progress Notes (Signed)
Patient is now feeling better since tube feedings were stopped and scheduled reglan was given. Will continue to monitor.  Jamie Arias

## 2019-04-08 NOTE — Progress Notes (Signed)
[facility]  ROUNDING NOTE   Subjective:   Seen and examined on hemodialysis treatment. Unable to tolerate much ultrafiltration.     HEMODIALYSIS FLOWSHEET:  Blood Flow Rate (mL/min): 400 mL/min Arterial Pressure (mmHg): -170 mmHg Venous Pressure (mmHg): 150 mmHg Transmembrane Pressure (mmHg): 60 mmHg Ultrafiltration Rate (mL/min): 300 mL/min Dialysate Flow Rate (mL/min): 600 ml/min Conductivity: Machine : 14.4 Conductivity: Machine : 14.4 Dialysis Fluid Bolus: Normal Saline Bolus Amount (mL): 250 mL Dialysate Change: (3K)    Objective:  Vital signs in last 24 hours:  Temp:  [97.4 F (36.3 C)-98.6 F (37 C)] 98.6 F (37 C) (02/03 1546) Pulse Rate:  [70-90] 73 (02/03 1546) Resp:  [11-22] 16 (02/03 1546) BP: (84-179)/(47-105) 147/52 (02/03 1546) SpO2:  [96 %-100 %] 99 % (02/03 1546) Weight:  [84.6 kg] 84.6 kg (02/02 1700)  Weight change:  Filed Weights   03/27/19 1045 03/31/19 1220 04/07/19 1700  Weight: 81 kg 83.5 kg 84.6 kg    Intake/Output: I/O last 3 completed shifts: In: 110 [Other:110] Out: 0    Intake/Output this shift:  Total I/O In: 110.2 [I.V.:110.2] Out: 1000 [Other:1000]  Physical Exam: General: NAD, laying in bed  Head: Normocephalic, atraumatic.  Eyes: Anicteric, PERRL  Neck: Supple, trachea midline  Lungs:  Clear to auscultation  Heart: Regular rate and rhythm  Abdomen:  Soft, nontender,   Extremities:  right upper extremity edema.  Neurologic: Verbal but not consistent with answers, +tremor  Skin: No lesions  Access: LIJ permcath    Basic Metabolic Panel: Recent Labs  Lab 04/04/19 0731 04/04/19 0731 04/05/19 0507 04/05/19 0507 04/06/19 0930 04/07/19 0538 04/08/19 1201  NA 138  --  135  --  134* 135 141  K 5.4*  --  4.8  --  5.1 5.0 3.8  CL 98  --  91*  --  93* 97* 99  CO2 32  --  30  --  33* 31 34*  GLUCOSE 174*  --  170*  --  159* 126* 88  BUN 22  --  34*  --  46* 24* 22  CREATININE 1.74*  --  2.45*  --   3.00* 1.89* 1.74*  CALCIUM 8.3*   < > 8.6*   < > 8.4* 9.0 8.3*  PHOS 3.6  --  4.4  --  4.6 3.8 2.9   < > = values in this interval not displayed.    Liver Function Tests: Recent Labs  Lab 04/04/19 0731 04/05/19 0507 04/06/19 0930 04/07/19 0538 04/08/19 1201  ALBUMIN 1.9* 1.9* 1.9* 2.2* 2.0*   No results for input(s): LIPASE, AMYLASE in the last 168 hours. No results for input(s): AMMONIA in the last 168 hours.  CBC: Recent Labs  Lab 04/03/19 1434 04/06/19 0500 04/07/19 0538 04/08/19 1201  WBC 5.0 5.2 5.6 5.1  HGB 7.3* 6.9* 9.5* 8.7*  HCT 23.3* 22.5* 29.9* 27.0*  MCV 99.6 100.4* 96.5 96.4  PLT 235 248 257 243    Cardiac Enzymes: No results for input(s): CKTOTAL, CKMB, CKMBINDEX, TROPONINI in the last 168 hours.  BNP: Invalid input(s): POCBNP  CBG: Recent Labs  Lab 04/08/19 0410 04/08/19 0454 04/08/19 0728 04/08/19 1142 04/08/19 1256  GLUCAP 56* 127* 80 15 75    Microbiology: Results for orders placed or performed during the hospital encounter of 02/09/19  Culture, blood (routine x 2)     Status: Abnormal   Collection Time: 02/09/19  2:29 PM   Specimen: BLOOD LEFT ARM  Result Value Ref Range  Status   Specimen Description   Final    BLOOD LEFT ARM Performed at [facility], [address] [name], [place] [address]    Special Requests   Final    BOTTLES DRAWN AEROBIC AND ANAEROBIC Blood Culture adequate volume Performed at [facility], [address] [place], [place] [address]    Culture  Setup Time   Final    GRAM POSITIVE COCCI IN BOTH AEROBIC AND ANAEROBIC BOTTLES CRITICAL VALUE NOTED.  VALUE IS CONSISTENT WITH PREVIOUSLY REPORTED AND CALLED VALUE. Performed at [facility], [address] [place], [place] [address]    Culture (A)  Final    STAPHYLOCOCCUS AUREUS SUSCEPTIBILITIES PERFORMED ON PREVIOUS CULTURE WITHIN THE LAST 5 DAYS. Performed at [facility], [place] [address] [place], [place] [address]     Report Status 02/12/2019 FINAL  Final  Culture, blood (routine x 2)     Status: Abnormal   Collection Time: 02/09/19  2:38 PM   Specimen: BLOOD LEFT ARM  Result Value Ref Range Status   Specimen Description   Final    BLOOD LEFT ARM Performed at [facility], [address], [place], [place] [address]    Special Requests   Final    BOTTLES DRAWN AEROBIC AND ANAEROBIC Blood Culture adequate volume Performed at [facility], [address], [place], [place] [address]    Culture  Setup Time   Final    GRAM POSITIVE COCCI IN BOTH AEROBIC AND ANAEROBIC BOTTLES CRITICAL RESULT CALLED TO, READ BACK BY AND VERIFIED WITH: [place] [id number] 02/10/2019 HNM Performed at [facility], [place] [address] [place], [place] [address]    Culture METHICILLIN RESISTANT STAPHYLOCOCCUS AUREUS (A)  Final   Report Status 02/12/2019 FINAL  Final   Organism ID, Bacteria METHICILLIN RESISTANT STAPHYLOCOCCUS AUREUS  Final      Susceptibility   Methicillin resistant staphylococcus aureus - MIC*    CIPROFLOXACIN >=8 RESISTANT Resistant     ERYTHROMYCIN >=8 RESISTANT Resistant     GENTAMICIN <=0.5 SENSITIVE Sensitive     OXACILLIN >=4 RESISTANT Resistant     TETRACYCLINE <=1 SENSITIVE Sensitive     VANCOMYCIN 1 SENSITIVE Sensitive     TRIMETH/SULFA <=10 SENSITIVE Sensitive     CLINDAMYCIN RESISTANT Resistant     RIFAMPIN <=0.5 SENSITIVE Sensitive     Inducible Clindamycin POSITIVE Resistant     * METHICILLIN RESISTANT STAPHYLOCOCCUS AUREUS  Blood Culture ID Panel (Reflexed)     Status: Abnormal   Collection Time: 02/09/19  2:38 PM  Result Value Ref Range Status   Enterococcus species NOT DETECTED NOT DETECTED Final   Listeria monocytogenes NOT DETECTED NOT DETECTED Final   Staphylococcus species DETECTED (A) NOT DETECTED Final    Comment: CRITICAL RESULT CALLED TO, READ BACK BY AND VERIFIED WITH: Hart Robinsons PHARMD [id number] 02/10/2019 HNM    Staphylococcus aureus (BCID) DETECTED (A)  NOT DETECTED Final    Comment: Methicillin (oxacillin)-resistant Staphylococcus aureus (MRSA). MRSA is predictably resistant to beta-lactam antibiotics (except ceftaroline). Preferred therapy is vancomycin unless clinically contraindicated. Patient requires contact precautions if  hospitalized. CRITICAL RESULT CALLED TO, READ BACK BY AND VERIFIED WITH: Hart Robinsons PHARMD [id number] 02/10/2019 HNM    Methicillin resistance DETECTED (A) NOT DETECTED Final    Comment: CRITICAL RESULT CALLED TO, READ BACK BY AND VERIFIED WITH: Hart Robinsons PHARMD [id number] 02/10/2019 HNM    Streptococcus species NOT DETECTED NOT DETECTED Final   Streptococcus agalactiae NOT DETECTED  NOT DETECTED Final   Streptococcus pneumoniae NOT DETECTED NOT DETECTED Final   Streptococcus pyogenes NOT DETECTED NOT DETECTED Final   Acinetobacter baumannii NOT DETECTED NOT DETECTED Final   Enterobacteriaceae species NOT DETECTED NOT DETECTED Final   Enterobacter cloacae complex NOT DETECTED NOT DETECTED Final   Escherichia coli NOT DETECTED NOT DETECTED Final   Klebsiella oxytoca NOT DETECTED NOT DETECTED Final   Klebsiella pneumoniae NOT DETECTED NOT DETECTED Final   Proteus species NOT DETECTED NOT DETECTED Final   Serratia marcescens NOT DETECTED NOT DETECTED Final   Haemophilus influenzae NOT DETECTED NOT DETECTED Final   Neisseria meningitidis NOT DETECTED NOT DETECTED Final   Pseudomonas aeruginosa NOT DETECTED NOT DETECTED Final   Candida albicans NOT DETECTED NOT DETECTED Final   Candida glabrata NOT DETECTED NOT DETECTED Final   Candida krusei NOT DETECTED NOT DETECTED Final   Candida parapsilosis NOT DETECTED NOT DETECTED Final   Candida tropicalis NOT DETECTED NOT DETECTED Final    Comment: Performed at [facility], [place], [place] [address]  SARS CORONAVIRUS 2 (TAT 6-24 HRS) Nasopharyngeal Nasopharyngeal Swab     Status: None   Collection Time: 02/09/19  2:54 PM   Specimen: Nasopharyngeal Swab   Result Value Ref Range Status   SARS Coronavirus 2 NEGATIVE NEGATIVE Final    Comment: (NOTE) SARS-CoV-2 target nucleic acids are NOT DETECTED. The SARS-CoV-2 RNA is generally detectable in upper and lower respiratory specimens during the acute phase of infection. Negative results do not preclude SARS-CoV-2 infection, do not rule out co-infections with other pathogens, and should not be used as the sole basis for treatment or other patient management decisions. Negative results must be combined with clinical observations, patient history, and epidemiological information. The expected result is Negative. Fact Sheet for Patients: SugarRoll.be Fact Sheet for Healthcare Providers: https://www.woods-mathews.com/ This test is not yet approved or cleared by the [place] FDA and  has been authorized for detection and/or diagnosis of SARS-CoV-2 by FDA under an Emergency Use Authorization (EUA). This EUA will remain  in effect (meaning this test can be used) for the duration of the COVID-19 declaration under Section 56 4(b)(1) of the Act, 21 U.S.C. section 360bbb-3(b)(1), unless the authorization is terminated or revoked sooner. Performed at [facility], [place] [address] [place], [place] [address]   MRSA PCR Screening     Status: Abnormal   Collection Time: 02/10/19  5:03 PM   Specimen: Nasopharyngeal  Result Value Ref Range Status   MRSA by PCR POSITIVE (A) NEGATIVE Final    Comment:        The GeneXpert MRSA Assay (FDA approved for NASAL specimens only), is one component of a comprehensive MRSA colonization surveillance program. It is not intended to diagnose MRSA infection nor to guide or monitor treatment for MRSA infections. RESULT CALLED TO, READ BACK BY AND VERIFIED WITH: ALEKSEY FRASER @1839  02/10/19 MJU Performed at [facility], [address] [place], [place] [address]   Culture, blood (routine x 2)      Status: Abnormal   Collection Time: 02/11/19 12:31 PM   Specimen: BLOOD  Result Value Ref Range Status   Specimen Description   Final    BLOOD PORTA CATH Performed at [place] [address], [place], [place] [address]    Special Requests   Final    BOTTLES DRAWN AEROBIC AND ANAEROBIC Blood Culture adequate volume Performed at [facility], [place], [place]  [address]    Culture  Setup Time   Final    GRAM POSITIVE COCCI ANAEROBIC BOTTLE ONLY CRITICAL RESULT CALLED TO, READ BACK BY AND VERIFIED WITH: SCOTT HALL AT [id number] 02/12/2019 [facility]  Performed at [facility], [address] [place], [place] [address]    Culture (A)  Final    STAPHYLOCOCCUS AUREUS SUSCEPTIBILITIES PERFORMED ON PREVIOUS CULTURE WITHIN THE LAST 5 DAYS. Performed at [facility], [place] [address], [place], [place] [address]    Report Status 02/13/2019 FINAL  Final  Culture, blood (routine x 2)     Status: None   Collection Time: 02/11/19  3:57 PM   Specimen: BLOOD  Result Value Ref Range Status   Specimen Description BLOOD PORTA CATH  Final   Special Requests   Final    BOTTLES DRAWN AEROBIC AND ANAEROBIC Blood Culture adequate volume   Culture   Final    NO GROWTH 5 DAYS Performed at [facility], [address] [place], [place] [address]    Report Status 02/16/2019 FINAL  Final  CULTURE, BLOOD (ROUTINE X 2) w Reflex to ID Panel     Status: Abnormal   Collection Time: 02/13/19  2:18 PM   Specimen: BLOOD LEFT HAND  Result Value Ref Range Status   Specimen Description BLOOD LEFT HAND  Final   Special Requests   Final    BOTTLES DRAWN AEROBIC AND ANAEROBIC Blood Culture results may not be optimal due to an inadequate volume of blood received in culture bottles   Culture  Setup Time   Final    AEROBIC BOTTLE ONLY GRAM POSITIVE COCCI IN CLUSTERS CRITICAL RESULT CALLED TO, READ BACK BY AND VERIFIED WITH: Herbert Pun AT [id number] ON 02/14/2019  [place]. Performed at [facility], [place] [address] [place],  [address]    Culture METHICILLIN RESISTANT STAPHYLOCOCCUS AUREUS (A)  Final   Report Status 02/16/2019 FINAL  Final   Organism ID, Bacteria METHICILLIN RESISTANT STAPHYLOCOCCUS AUREUS  Final      Susceptibility   Methicillin resistant staphylococcus aureus - MIC*    CIPROFLOXACIN >=8 RESISTANT Resistant     ERYTHROMYCIN >=8 RESISTANT Resistant     GENTAMICIN <=0.5 SENSITIVE Sensitive     OXACILLIN >=4 RESISTANT Resistant     TETRACYCLINE <=1 SENSITIVE Sensitive     VANCOMYCIN <=0.5 SENSITIVE Sensitive     TRIMETH/SULFA <=10 SENSITIVE Sensitive     CLINDAMYCIN RESISTANT Resistant     RIFAMPIN <=0.5 SENSITIVE Sensitive     Inducible Clindamycin POSITIVE Resistant     * METHICILLIN RESISTANT STAPHYLOCOCCUS AUREUS  Blood Culture ID Panel (Reflexed)     Status: Abnormal   Collection Time: 02/13/19  2:18 PM  Result Value Ref Range Status   Enterococcus species NOT DETECTED NOT DETECTED Final   Listeria monocytogenes NOT DETECTED NOT DETECTED Final   Staphylococcus species DETECTED (A) NOT DETECTED Final    Comment: CRITICAL RESULT CALLED TO, READ BACK BY AND VERIFIED WITH: ABBEY ELLINGTON AT [id number] ON 02/14/2019 [place].    Staphylococcus aureus (BCID) DETECTED (A) NOT DETECTED Final    Comment: Methicillin (oxacillin)-resistant Staphylococcus aureus (MRSA). MRSA is predictably resistant to beta-lactam antibiotics (except ceftaroline). Preferred therapy is vancomycin unless clinically contraindicated. Patient requires contact precautions if  hospitalized. CRITICAL RESULT CALLED TO, READ BACK BY AND VERIFIED WITH: Herbert Pun AT [id number] ON 02/14/2019 [place].    Methicillin resistance DETECTED (A) NOT DETECTED Final    Comment: CRITICAL RESULT CALLED TO, READ BACK  BY AND VERIFIED WITH: Herbert Pun AT [id number] ON 02/14/2019 [place].    Streptococcus species NOT DETECTED NOT DETECTED Final   Streptococcus agalactiae NOT DETECTED  NOT DETECTED Final   Streptococcus pneumoniae NOT DETECTED NOT DETECTED Final   Streptococcus pyogenes NOT DETECTED NOT DETECTED Final   Acinetobacter baumannii NOT DETECTED NOT DETECTED Final   Enterobacteriaceae species NOT DETECTED NOT DETECTED Final   Enterobacter cloacae complex NOT DETECTED NOT DETECTED Final   Escherichia coli NOT DETECTED NOT DETECTED Final   Klebsiella oxytoca NOT DETECTED NOT DETECTED Final   Klebsiella pneumoniae NOT DETECTED NOT DETECTED Final   Proteus species NOT DETECTED NOT DETECTED Final   Serratia marcescens NOT DETECTED NOT DETECTED Final   Haemophilus influenzae NOT DETECTED NOT DETECTED Final   Neisseria meningitidis NOT DETECTED NOT DETECTED Final   Pseudomonas aeruginosa NOT DETECTED NOT DETECTED Final   Candida albicans NOT DETECTED NOT DETECTED Final   Candida glabrata NOT DETECTED NOT DETECTED Final   Candida krusei NOT DETECTED NOT DETECTED Final   Candida parapsilosis NOT DETECTED NOT DETECTED Final   Candida tropicalis NOT DETECTED NOT DETECTED Final    Comment: Performed at [facility], [address] [place], [place] [address]  CULTURE, BLOOD (ROUTINE X 2) w Reflex to ID Panel     Status: None   Collection Time: 02/13/19  3:09 PM   Specimen: BLOOD  Result Value Ref Range Status   Specimen Description BLOOD BLOOD LEFT HAND  Final   Special Requests   Final    BOTTLES DRAWN AEROBIC AND ANAEROBIC Blood Culture adequate volume   Culture   Final    NO GROWTH 5 DAYS Performed at [facility], [address], [place], [place] [address]    Report Status 02/18/2019 FINAL  Final  Culture, blood (routine x 2)     Status: Abnormal   Collection Time: 02/15/19 12:49 PM   Specimen: BLOOD  Result Value Ref Range Status   Specimen Description   Final    BLOOD LT HAND Performed at [facility], [address] [place], [place] [address]    Special Requests   Final    BOTTLES DRAWN AEROBIC AND ANAEROBIC Blood  Culture results may not be optimal due to an inadequate volume of blood received in culture bottles Performed at [facility], [address] [place], [place] [address]    Culture  Setup Time   Final    GRAM POSITIVE COCCI IN BOTH AEROBIC AND ANAEROBIC BOTTLES CRITICAL RESULT CALLED TO, READ BACK BY AND VERIFIED WITHEleonore Chiquito AT [id number] 02/16/2019 [place] Performed at [facility], [address] [place], [place] [address]    Culture (A)  Final    METHICILLIN RESISTANT STAPHYLOCOCCUS AUREUS STAPHYLOCOCCUS SPECIES (COAGULASE NEGATIVE) THE SIGNIFICANCE OF ISOLATING THIS ORGANISM FROM A SINGLE SET OF BLOOD CULTURES WHEN MULTIPLE SETS ARE DRAWN IS UNCERTAIN. PLEASE NOTIFY THE MICROBIOLOGY DEPARTMENT WITHIN ONE WEEK IF SPECIATION AND SENSITIVITIES ARE REQUIRED. Performed at [facility], [place] [address], [place], [place] [address]    Report Status 02/18/2019 FINAL  Final   Organism ID, Bacteria METHICILLIN RESISTANT STAPHYLOCOCCUS AUREUS  Final      Susceptibility   Methicillin resistant staphylococcus aureus - MIC*    CIPROFLOXACIN >=8 RESISTANT Resistant     ERYTHROMYCIN >=8 RESISTANT Resistant     GENTAMICIN <=0.5 SENSITIVE Sensitive     OXACILLIN >=4 RESISTANT Resistant     TETRACYCLINE <=1 SENSITIVE Sensitive  VANCOMYCIN <=0.5 SENSITIVE Sensitive     TRIMETH/SULFA <=10 SENSITIVE Sensitive     CLINDAMYCIN RESISTANT Resistant     RIFAMPIN <=0.5 SENSITIVE Sensitive     Inducible Clindamycin POSITIVE Resistant     * METHICILLIN RESISTANT STAPHYLOCOCCUS AUREUS  Aerobic Culture (superficial specimen)     Status: None   Collection Time: 02/17/19  1:31 PM   Specimen: Heel; Wound  Result Value Ref Range Status   Specimen Description   Final    HEEL Performed at [facility], [address], [place], [place] [address]    Special Requests   Final    NONE Performed at [facility], [address] [place], [place] [address]    Gram Stain    Final    NO WBC SEEN MODERATE GRAM POSITIVE COCCI IN PAIRS    Culture   Final    ABUNDANT METHICILLIN RESISTANT STAPHYLOCOCCUS AUREUS ABUNDANT DIPHTHEROIDS(CORYNEBACTERIUM SPECIES) Standardized susceptibility testing for this organism is not available. Performed at [facility], [place] [address] [place], [place] [address]    Report Status 02/19/2019 FINAL  Final   Organism ID, Bacteria METHICILLIN RESISTANT STAPHYLOCOCCUS AUREUS  Final      Susceptibility   Methicillin resistant staphylococcus aureus - MIC*    CIPROFLOXACIN >=8 RESISTANT Resistant     ERYTHROMYCIN >=8 RESISTANT Resistant     GENTAMICIN <=0.5 SENSITIVE Sensitive     OXACILLIN >=4 RESISTANT Resistant     TETRACYCLINE <=1 SENSITIVE Sensitive     VANCOMYCIN 1 SENSITIVE Sensitive     TRIMETH/SULFA <=10 SENSITIVE Sensitive     CLINDAMYCIN RESISTANT Resistant     RIFAMPIN <=0.5 SENSITIVE Sensitive     Inducible Clindamycin POSITIVE Resistant     * ABUNDANT METHICILLIN RESISTANT STAPHYLOCOCCUS AUREUS  CULTURE, BLOOD (ROUTINE X 2) w Reflex to ID Panel     Status: None   Collection Time: 02/18/19 11:29 PM   Specimen: BLOOD  Result Value Ref Range Status   Specimen Description BLOOD LEFT ARM  Final   Special Requests   Final    BOTTLES DRAWN AEROBIC AND ANAEROBIC Blood Culture adequate volume   Culture   Final    NO GROWTH 5 DAYS Performed at [facility], [address] [place], [place] [address]    Report Status 02/23/2019 FINAL  Final  CULTURE, BLOOD (ROUTINE X 2) w Reflex to ID Panel     Status: None   Collection Time: 02/18/19 11:29 PM   Specimen: BLOOD  Result Value Ref Range Status   Specimen Description BLOOD LEFT ARM  Final   Special Requests   Final    BOTTLES DRAWN AEROBIC AND ANAEROBIC Blood Culture adequate volume   Culture   Final    NO GROWTH 5 DAYS Performed at [facility], [address] [place], [place] [address]    Report Status 02/23/2019 FINAL  Final   Aerobic/Anaerobic Culture (surgical/deep wound)     Status: None   Collection Time: 03/04/19 11:04 AM   Specimen: PATH Other; Tissue  Result Value Ref Range Status   Specimen Description   Final    ARTERY Performed at [facility], [address], [place], [place] [address]    Special Requests   Final    RIGHT AV GRAFT AND STENTS CULTURE Performed at [facility], [address] [place], [place] [address]    Gram Stain   Final    RARE WBC PRESENT, PREDOMINANTLY PMN NO ORGANISMS SEEN  Culture   Final    No growth aerobically or anaerobically. Performed at [facility], [place] [address], [place], [place] [address]    Report Status 03/09/2019 FINAL  Final  Aerobic/Anaerobic Culture (surgical/deep wound)     Status: None   Collection Time: 03/04/19 11:29 AM   Specimen: PATH Other; Tissue  Result Value Ref Range Status   Specimen Description   Final    ARTERY Performed at [facility], [address], [place], [place] [address]    Special Requests   Final    FLUID AROUND AV GRAFT Performed at [facility], [address] [place], [place] [address]    Gram Stain   Final    FEW WBC PRESENT, PREDOMINANTLY PMN NO ORGANISMS SEEN    Culture   Final    No growth aerobically or anaerobically. Performed at [facility], [place] [address], [place], [place] [address]    Report Status 03/09/2019 FINAL  Final  Wet prep, genital     Status: Abnormal   Collection Time: 03/16/19 10:01 AM  Result Value Ref Range Status   Yeast Wet Prep HPF POC PRESENT (A) NONE SEEN Final   Trich, Wet Prep NONE SEEN NONE SEEN Final   Clue Cells Wet Prep HPF POC NONE SEEN NONE SEEN Final   WBC, Wet Prep HPF POC MANY (A) NONE SEEN Final   Sperm NONE SEEN  Final    Comment: Performed at [facility], [address], [place], [place] [address]  Aerobic Culture (superficial specimen)     Status: None   Collection Time: 03/17/19  5:13 PM    Specimen: Vaginal Fluid  Result Value Ref Range Status   Specimen Description   Final    VAGINA Performed at [facility], [address], [place], [place] [address]    Special Requests   Final    NONE Performed at [facility], [address] [place], [place] [address]    Gram Stain   Final    MODERATE WBC PRESENT,BOTH PMN AND MONONUCLEAR FEW GRAM POSITIVE COCCI IN PAIRS FEW GRAM NEGATIVE RODS Performed at [facility], [place] [address] [place], [place] [address]    Culture   Final    ABUNDANT ESCHERICHIA COLI MODERATE KLEBSIELLA PNEUMONIAE    Report Status 03/21/2019 FINAL  Final   Organism ID, Bacteria ESCHERICHIA COLI  Final   Organism ID, Bacteria KLEBSIELLA PNEUMONIAE  Final      Susceptibility   Escherichia coli - MIC*    AMPICILLIN 4 SENSITIVE Sensitive     CEFAZOLIN <=4 SENSITIVE Sensitive     CEFEPIME <=0.12 SENSITIVE Sensitive     CEFTAZIDIME <=1 SENSITIVE Sensitive     CEFTRIAXONE <=0.25 SENSITIVE Sensitive     CIPROFLOXACIN <=0.25 SENSITIVE Sensitive     GENTAMICIN <=1 SENSITIVE Sensitive     IMIPENEM <=0.25 SENSITIVE Sensitive     TRIMETH/SULFA <=20 SENSITIVE Sensitive     AMPICILLIN/SULBACTAM <=2 SENSITIVE Sensitive     PIP/TAZO <=4 SENSITIVE Sensitive     * ABUNDANT ESCHERICHIA COLI   Klebsiella pneumoniae - MIC*    AMPICILLIN >=32 RESISTANT Resistant     CEFAZOLIN <=4 SENSITIVE Sensitive     CEFEPIME <=0.12 SENSITIVE Sensitive     CEFTAZIDIME <=1 SENSITIVE Sensitive     CEFTRIAXONE <=0.25 SENSITIVE Sensitive     CIPROFLOXACIN <=0.25 SENSITIVE Sensitive     GENTAMICIN <=1 SENSITIVE Sensitive     IMIPENEM <=0.25 SENSITIVE Sensitive  TRIMETH/SULFA <=20 SENSITIVE Sensitive     AMPICILLIN/SULBACTAM 4 SENSITIVE Sensitive     PIP/TAZO <=4 SENSITIVE Sensitive     * MODERATE KLEBSIELLA PNEUMONIAE  Anaerobic culture     Status: None   Collection Time: 03/20/19 12:40 PM   Specimen: PATH Cytology CSF; Cerebrospinal Fluid  Result  Value Ref Range Status   Specimen Description   Final    CSF Performed at [facility], [address], [place], [place] [address]    Special Requests   Final    NONE Performed at [facility], [address] [place], [place] [address]    Gram Stain   Final    WBC PRESENT, PREDOMINANTLY MONONUCLEAR NO ORGANISMS SEEN CYTOSPIN SMEAR    Culture   Final    NO ANAEROBES ISOLATED Performed at [facility], [place] [address] [place], [place] [address]    Report Status 03/27/2019 FINAL  Final  CSF culture     Status: None   Collection Time: 03/20/19 12:40 PM   Specimen: PATH Cytology CSF; Cerebrospinal Fluid  Result Value Ref Range Status   Specimen Description   Final    CSF Performed at [place] [facility], [address], [place], [place] [address]    Special Requests   Final    NONE Performed at [facility], [address] [place], [place] [address]    Gram Stain   Final    WBC PRESENT, PREDOMINANTLY MONONUCLEAR NO ORGANISMS SEEN CYTOSPIN SMEAR    Culture   Final    NO GROWTH 3 DAYS Performed at [facility], [place] [address], [place], [place] [address]    Report Status 03/24/2019 FINAL  Final  Fungus Culture With Stain     Status: None (Preliminary result)   Collection Time: 03/20/19 12:40 PM   Specimen: PATH Cytology CSF; Cerebrospinal Fluid  Result Value Ref Range Status   Fungus Stain Final report  Final    Comment: (NOTE) Performed At: [facility] [address] [place], [place] [id number] Rush Farmer MD [id number]    Fungus (Mycology) Culture PENDING  Incomplete   Fungal Source CSF  Final    Comment: Performed at [facility], [address] [place], [place] [address]  Fungus Culture Result     Status: None   Collection Time: 03/20/19 12:40 PM  Result Value Ref Range Status   Result 1 Comment  Final    Comment: (NOTE) KOH/Calcofluor preparation:  no fungus observed. Performed At: [facility] [place], [place] [id number] Rush Farmer MD [id number]   MRSA PCR Screening     Status: None   Collection Time: 04/03/19  8:15 PM   Specimen: Nasopharyngeal  Result Value Ref Range Status   MRSA by PCR NEGATIVE NEGATIVE Final    Comment:        The GeneXpert MRSA Assay (FDA approved for NASAL specimens only), is one component of a comprehensive MRSA colonization surveillance program. It is not intended to diagnose MRSA infection nor to guide or monitor treatment for MRSA infections. Performed at [facility], [address] [place], [place] [address]     Coagulation Studies: No results for input(s): LABPROT, INR in the last 72 hours.  Urinalysis: No results for input(s): COLORURINE, LABSPEC, PHURINE, GLUCOSEU, HGBUR, BILIRUBINUR, KETONESUR, PROTEINUR, UROBILINOGEN, NITRITE, LEUKOCYTESUR in the last 72 hours.  Invalid input(s): APPERANCEUR    Imaging: No results found.   Medications:   . sodium chloride  20 mL (03/23/19 1852)  . dextrose 5 % and 0.9% NaCl 50 mL/hr at 04/08/19 0800  . feeding supplement (OSMOLITE 1.5 CAL) 1,000 mL (04/07/19 2218)   . acidophilus  2 capsule Oral BID  . alteplase  2 mg Intracatheter Once  . alteplase  2 mg Intracatheter Once  . apixaban  5 mg Oral BID  . aspirin  81 mg Per Tube Daily  . B-complex with vitamin C  1 tablet Per Tube Daily  . Chlorhexidine Gluconate Cloth  6 each Topical Q0600  . epoetin (EPOGEN/PROCRIT) injection  10,000 Units Intravenous Q M,W,F-HD  . feeding supplement (PRO-STAT SUGAR FREE 64)  30 mL Per Tube Daily  . ferrous gluconate  324 mg Oral Daily  . fluticasone  2 spray Each Nare Daily  . folic acid  1 mg Per Tube Daily  . free water  20 mL Per Tube Q4H  . insulin aspart  0-15 Units Subcutaneous Q4H  . insulin glargine  10 Units Subcutaneous Daily  . levothyroxine  100 mcg Per Tube QAC breakfast  . mouth rinse  15 mL Mouth Rinse BID  . metoCLOPramide  (REGLAN) injection  10 mg Intravenous Q6H  . midodrine  10 mg Oral Q M,W,F-HD  . pantoprazole sodium  40 mg Per Tube BID   sodium chloride, acetaminophen, ipratropium-albuterol, ondansetron (ZOFRAN) IV, promethazine, promethazine, traMADol  Assessment/ Plan:  [name] is a 64 y.o. white female 63 year oldwith end-stage renal disease, atrial fibrillation, chronic diastolic congestive heart failure, CAD status post CABG, type 2 diabetes, hypertension, hyperlipidemia, hypothyroidism, Covid positive in July 2020, recent nursing home stay in [place],Prolonged hospitalization for MRSA bacteremia/sepsis and now with acute encephalopathy  [facility] M[facility]. LIJ permcath 83.5kg  1. End Stage Renal Disease on hemodialysis. Seen and examined on hemodialysis treatment. Tolerating treatment well in chair.  - MWF schedule  2. Anemia of chronic kidney disease: status post PRBC transfusion on 2/1 - EPO with HD treatment.   3. Hypotension:   - midodrine with HD treatment.   4. Secondary Hyperparathyroidism: not currently on binders. PTH 139, on 02/06/2019.   5. Encephalopathy: Continues to have improvement.     LOS: [place] 2/3/20213:48 PM

## 2019-04-08 NOTE — Progress Notes (Signed)
This note also relates to the following rows which could not be included: Pulse Rate - Cannot attach notes to unvalidated device data Resp - Cannot attach notes to unvalidated device data BP - Cannot attach notes to unvalidated device data  Hd completed  

## 2019-04-08 NOTE — Progress Notes (Signed)
Patient's blood sugar was 56. Gave 56mL of D50 IV. Will recheck in 15 minutes.  Christene Slates

## 2019-04-08 NOTE — Progress Notes (Signed)
SLP Cancellation Note  Patient Details Name: ZIOMARA TRINGALI MRN: [id number] DOB: February 15, 1956   Cancelled treatment:       Reason Eval/Treat Not Completed: Patient at procedure or test/unavailable(chart reviewed; pt at HD) Pt out of room earlier. ST services will f/u earlier in the morning tomorrow for assessment.     Orinda Kenner, MS, CCC-SLP Suriyah Vergara 04/08/2019, 4:19 PM

## 2019-04-08 NOTE — Progress Notes (Signed)
Rechecked blood sugar and it increased to 127.  Will continue to monitor.  Christene Slates

## 2019-04-09 ENCOUNTER — Inpatient Hospital Stay: Payer: Medicare Other

## 2019-04-09 DIAGNOSIS — R110 Nausea: Secondary | ICD-10-CM

## 2019-04-09 LAB — RENAL FUNCTION PANEL
Albumin: 2.1 g/dL — ABNORMAL LOW (ref 3.5–5.0)
Anion gap: 12 (ref 5–15)
BUN: 19 mg/dL (ref 8–23)
CO2: 33 mmol/L — ABNORMAL HIGH (ref 22–32)
Calcium: 9.1 mg/dL (ref 8.9–10.3)
Chloride: 96 mmol/L — ABNORMAL LOW (ref 98–111)
Creatinine, Ser: 1.98 mg/dL — ABNORMAL HIGH (ref 0.44–1.00)
GFR calc Af Amer: 30 mL/min — ABNORMAL LOW (ref >60)
GFR calc non Af Amer: 26 mL/min — ABNORMAL LOW (ref >60)
Glucose, Bld: 90 mg/dL (ref 70–99)
Phosphorus: 3.7 mg/dL (ref 2.5–4.6)
Potassium: 4.4 mmol/L (ref 3.5–5.1)
Sodium: 141 mmol/L (ref 135–145)

## 2019-04-09 LAB — GLUCOSE, CAPILLARY
Glucose-Capillary: 116 mg/dL — ABNORMAL HIGH (ref 70–99)
Glucose-Capillary: 117 mg/dL — ABNORMAL HIGH (ref 70–99)
Glucose-Capillary: 127 mg/dL — ABNORMAL HIGH (ref 70–99)
Glucose-Capillary: 34 mg/dL — CL (ref 70–99)
Glucose-Capillary: 56 mg/dL — ABNORMAL LOW (ref 70–99)
Glucose-Capillary: 57 mg/dL — ABNORMAL LOW (ref 70–99)
Glucose-Capillary: 77 mg/dL (ref 70–99)
Glucose-Capillary: 79 mg/dL (ref 70–99)

## 2019-04-09 MED ORDER — DEXTROSE 5 % IV SOLN: INTRAVENOUS | Status: DC

## 2019-04-09 MED ORDER — DEXTROSE 50 % IV SOLN
25.0000 mL | Freq: Once | INTRAVENOUS | Status: AC
  Administered 2019-04-09: 25 mL via INTRAVENOUS

## 2019-04-09 MED ORDER — DEXTROSE 50 % IV SOLN
INTRAVENOUS | Status: AC
  Filled 2019-04-09: qty 50

## 2019-04-09 MED ORDER — DEXTROSE-NACL 5-0.45 % IV SOLN: INTRAVENOUS | Status: DC

## 2019-04-09 MED ORDER — DEXTROSE 50 % IV SOLN
25.0000 mL | Freq: Once | INTRAVENOUS | Status: AC
  Administered 2019-04-09: 25 mL via INTRAVENOUS
  Filled 2019-04-09: qty 50

## 2019-04-09 MED ORDER — PANCRELIPASE (LIP-PROT-AMYL) 10440-39150 UNITS PO TABS
20880.0000 [IU] | ORAL_TABLET | Freq: Once | ORAL | Status: AC
  Administered 2019-04-09: 20880 [IU]
  Filled 2019-04-09 (×2): qty 2

## 2019-04-09 MED ORDER — SODIUM BICARBONATE 650 MG PO TABS
650.0000 mg | ORAL_TABLET | Freq: Once | ORAL | Status: AC
  Administered 2019-04-09: 11:00:00 650 mg
  Filled 2019-04-09: qty 1

## 2019-04-09 MED ORDER — DEXTROSE 50 % IV SOLN
INTRAVENOUS | Status: AC
  Administered 2019-04-09: 50 mL
  Filled 2019-04-09: qty 50

## 2019-04-09 MED ORDER — ACETAMINOPHEN 10 MG/ML IV SOLN
1000.0000 mg | Freq: Once | INTRAVENOUS | Status: AC
  Administered 2019-04-09: 1000 mg via INTRAVENOUS
  Filled 2019-04-09: qty 100

## 2019-04-09 MED ORDER — DEXTROSE 50 % IV SOLN
50.0000 mL | Freq: Once | INTRAVENOUS | Status: AC
  Administered 2019-04-09: 19:00:00 50 mL via INTRAVENOUS

## 2019-04-09 MED ORDER — DEXTROSE 50 % IV SOLN: 1.0000 | Freq: Once | INTRAVENOUS | Status: AC

## 2019-04-09 MED ORDER — BLISTEX MEDICATED EX OINT
TOPICAL_OINTMENT | CUTANEOUS | Status: DC | PRN
  Filled 2019-04-09: qty 6.3

## 2019-04-09 MED ORDER — SALINE SPRAY 0.65 % NA SOLN
1.0000 | NASAL | Status: DC | PRN
  Filled 2019-04-09 (×2): qty 44

## 2019-04-09 MED ORDER — LIP MEDEX EX OINT
TOPICAL_OINTMENT | CUTANEOUS | Status: DC | PRN
  Filled 2019-04-09: qty 7

## 2019-04-09 MED ORDER — FERROUS GLUCONATE 324 (38 FE) MG PO TABS
324.0000 mg | ORAL_TABLET | Freq: Every day | ORAL | Status: DC
  Administered 2019-04-17 – 2019-04-20 (×4): 324 mg via ORAL
  Filled 2019-04-09 (×4): qty 1

## 2019-04-09 NOTE — Progress Notes (Signed)
Physical Therapy Treatment Patient Details Name: Jamie Arias MRN: [id number] DOB: 05/30/1955 Today's Date: 04/09/2019    History of Present Illness Pt is a 64 y.o. female presenting to hospital 02/09/19 with AMS.  Pt admitted with MRSA sepsis and bacteremia with hypotension, toxic metabolic encephalopathy, ESRD on HD, hypokalemia, elevated troponin, a-fib, and anemia of CKD.  LUE DVT diagnosed 03/03/19.  Of note pt had been intubated but was extubated 03/04/20.  PMH includes ESRD on HD, a-fib, chronic diastolic CHF, CAD s/p CABG, DM, htn, COVID (+) July 2020.    PT Comments    Pt agrees to session.  Participated in exercises as described below. B heel cord stretching. To EOB with max/total A x 2.  Once sitting, she initially needed mod a x 1 to maintain upright but after a few moments, she was able to sit with min guard and generally steady statically.  She is unable effectively use hands for support as they are painful with WB.  She sits about 5 minutes total.  Some c/o dizziness but does not limit sitting.  She requests to return to supine due to fatigue.  Max/total +2 to transition back to supine and reposition in bed.  Overall pt tolerates sitting well today and does participate in ex's as she is able.  She remains appropriate for [facility] transfers to recliner.    Follow Up Recommendations  SNF     Equipment Recommendations  Hospital bed    Recommendations for Other Services       Precautions / Restrictions Precautions Precautions: Fall Precaution Comments: L IJ HD permcath; NPO; PEG tube; aspiration precautions Restrictions Weight Bearing Restrictions: No    Mobility  Bed Mobility Overal bed mobility: Needs Assistance Bed Mobility: Supine to Sit;Sit to Supine     Supine to sit: Total assist;+2 for physical assistance Sit to supine: Total assist;+2 for physical assistance      Transfers                 General transfer comment: Hoyer lift appropraite for  transfers to recliner with nursing staff  Ambulation/Gait                 Stairs             Wheelchair Mobility    Modified Rankin (Stroke Patients Only)       Balance Overall balance assessment: Needs assistance Sitting-balance support: Feet supported;Bilateral upper extremity supported Sitting balance-Leahy Scale: Poor Sitting balance - Comments: Initially requries mod a x 1 for balance but able to progress to min gaurd with no LOB     Standing balance-Leahy Scale: Zero Standing balance comment: Unsafe to attempt                            Cognition Arousal/Alertness: Awake/alert Behavior During Therapy: Flat affect Overall Cognitive Status: Impaired/Different from baseline                                        Exercises Other Exercises Other Exercises: Passive ROM to bilat UE/LEs for strength/flexibility; mild act assist effort noted with L UE and R hand at times.    General Comments        Pertinent Vitals/Pain Pain Assessment: Faces Faces Pain Scale: Hurts little more Pain Location: B hands when therapist attempting to reposition hands on bed for  support Pain Descriptors / Indicators: Grimacing Pain Intervention(s): Monitored during session;Repositioned    Home Living                      Prior Function            PT Goals (current goals can now be found in the care plan section) Progress towards PT goals: Progressing toward goals    Frequency    Min 2X/week      PT Plan Current plan remains appropriate    Co-evaluation              AM-PAC PT "6 Clicks" Mobility   Outcome Measure  Help needed turning from your back to your side while in a flat bed without using bedrails?: Total Help needed moving from lying on your back to sitting on the side of a flat bed without using bedrails?: Total Help needed moving to and from a bed to a chair (including a wheelchair)?: Total Help needed  standing up from a chair using your arms (e.g., wheelchair or bedside chair)?: Total Help needed to walk in hospital room?: Total Help needed climbing 3-5 steps with a railing? : Total 6 Click Score: 6    End of Session   Activity Tolerance: Patient tolerated treatment well Patient left: in bed;with call bell/phone within reach;with bed alarm set Nurse Communication: Mobility status       Time: [id number] PT Time Calculation (min) (ACUTE ONLY): 23 min  Charges:  $Therapeutic Exercise: 8-22 mins $Therapeutic Activity: 8-22 mins                    Chesley Noon, PTA 04/09/19, 9:56 AM

## 2019-04-09 NOTE — TOC Progression Note (Addendum)
Transition of Care [facility]) - Progression Note    Patient Details  Name: Jamie Arias MRN: [id number] Date of Birth: [birth year]  [facility]) CM/SW [place], LCSW Phone Number: 04/09/2019, 9:02 AM  Clinical Narrative: Tried calling patient's Medicaid worker to check status of application. No answer/unable to leave a voicemail. Will try again later.    2:55 pm: Left voicemail for patient's Medicaid worker.  Expected Discharge Plan: [place] Barriers to Discharge: Continued Medical Work up  Expected Discharge Plan and Services Expected Discharge Plan: [place] In-house Referral: Clinical Social Work     Living arrangements for the past 2 months: Mobile Home                                       Social Determinants of Health (SDOH) Interventions    Readmission Risk Interventions Readmission Risk Prevention Plan 02/13/2019 09/15/2018  Transportation Screening - Complete  Medication Review Press photographer) Complete Complete  PCP or Specialist appointment within 3-5 days of discharge - Complete  HRI or [place] Complete Complete  SW Recovery Care/Counseling Consult Complete Complete  [place] Not Applicable Complete  Some recent data might be hidden

## 2019-04-09 NOTE — Progress Notes (Signed)
Patient's peg tube is clogged so I had to stop feedings. Patient complaining of nausea so I gave her phenergan and called NP Ouma regarding clogged peg tube. She ordered some medicine from pharmacy to declogg tube and  a KUB was also ordered. D5 1/2 NS @ 50 mL/hr was also started to keep patient's blood sugar from dropping.  Will continue to monitor. Christene Slates

## 2019-04-09 NOTE — Consult Note (Signed)
[name], MD [address]  [place]  [place], [place] [address]  Main: [phone]  Fax: [phone] Pager: [phone]   Consultation  Referring Provider:     No ref. provider found Primary Care Physician:  [name], MD Primary Gastroenterologist:  Dr. Bonna Gains      Reason for Consultation: Unable to tolerate tube feeds, clogged PEG  Date of Admission:  02/09/2019 Date of Consultation:  04/09/2019         HPI:   Jamie Arias is a 64 y.o. female with history of MRSA bacteremia, altered mental status s/p PEG placement by Dr. Bonna Gains on 03/03/2019.  PEG placement was uneventful and patient is tolerating tube feeds well.  Patient had a prolonged hospitalization, admitted on 02/09/2019 Patient is currently on hemodialysis, history of A. fib, CHF, coronary disease s/p bypass, metabolic syndrome, hypothyroidism, Covid positive in July 2020, been to nursing home, with prolonged hospitalization for MRSA bacteremia/sepsis and now with acute encephalopathy She had MRI of the brain which was negative, EEG was unremarkable. Patient developed left arm DVT during his hospitalization, currently on Eliquis for both DVT and A. fib Patient is currently being treated with vancomycin for MRSA bacteremia Patient has been tolerating tube feeds via PEG until recently, when she started complaining of nausea despite decreasing the rate Of tube feeds .she is also started on Reglan but nausea persisted.  Her PEG has clogged today.  Patient's nurse mentioned that they tried coke, viokase which did not help, therefore GI is consulted for further evaluation Patient has been having bowel movements regularly Patient's husband is bedside Patient's last dose of Eliquis was last night  NSAIDs: None  Antiplts/Anticoagulants/Anti thrombotics: Eliquis for history of A. fib and DVT  GI Procedures: EGD12/29/2020 - Normal esophagus. - Normal stomach. - Normal duodenal bulb, second portion  of the duodenum and examined duodenum. - An externally removable PEG placement was successfully completed.   Past Medical History:  Diagnosis Date  . Anemia   . Chronic diastolic CHF (congestive heart failure) [place]    a. Due to ischemic cardiomyopathy. EF as low as 35%, improved to normal s/p CABG; b. echo 07/06/13: EF 55-60%, no RWMAs, mod TR, trivial pericardial effusion not c/w tamponade physiology;  c. 10/2015 Echo: EF 65%, Gr1 DD, triv AI, mild MR, mildly dil LA, mod TR, PASP 18mHg.  .Marland KitchenCoronary artery disease    a. NSTEMI 06/2013; b.cath: severe three-vessel CAD w/ EF 30% & mild-mod MR; c. s/p 3 vessel CABG 07/02/13 (LIMA-LAD, SVG-OM, and SVG-RPDA);  d. 10/2015 MV: no ischemia/infarct.  . Diabetes mellitus without complication ([facility]   . Diabetic neuropathy ([facility]   . [facility] patient ([facility]    MWF  . ESRD (end stage renal disease) ([facility]    a. 12/2015 initiated - mwf dialysis.  .Marland KitchenGERD (gastroesophageal reflux disease)   . Hyperlipidemia   . Hypertension   . Hypothyroidism   . Myocardial infarction ([facility] 2015  . Neuropathy   . Pleural effusion 2015  . Pulmonary hypertension ([facility]   . Renal insufficiency   . Wears dentures    full lower    Past Surgical History:  Procedure Laterality Date  . A/V FISTULAGRAM Right 12/17/2016   Procedure: A/V Fistulagram;  Surgeon: DAlgernon Huxley MD;  [facility];  Service: Cardiovascular;  Laterality: Right;  . A/V FISTULAGRAM Right 01/07/2017   Procedure: A/V Fistulagram;  Surgeon: DAlgernon Huxley MD;  [facility];  Service: Cardiovascular;  Laterality: Right;  . A/V FISTULAGRAM Right 12/03/2017   Procedure: A/V FISTULAGRAM;  Surgeon: Katha Cabal, MD;  Location: [place] CV LAB;  Service: Cardiovascular;  Laterality: Right;  . A/V FISTULAGRAM Right 02/23/2019   Procedure: A/V Fistulagram;  Surgeon: Algernon Huxley, MD;  Location: [place] CV LAB;  Service: Cardiovascular;  Laterality: Right;  . A/V  SHUNT INTERVENTION N/A 02/22/2017   Procedure: A/V SHUNT INTERVENTION;  Surgeon: Algernon Huxley, MD;  Location: [place] CV LAB;  Service: Cardiovascular;  Laterality: N/A;  . AV FISTULA PLACEMENT Right 02/03/2016   Procedure: INSERTION OF ARTERIOVENOUS (AV) GORE-TEX GRAFT ARM ( BRACH / AXILLARY );  Surgeon: Katha Cabal, MD;  Location: [facility] ORS;  Service: Vascular;  Laterality: Right;  . [place] REMOVAL Right 03/04/2019   Procedure: REMOVAL OF ARTERIOVENOUS GORETEX GRAFT ([place]);  Surgeon: Algernon Huxley, MD;  Location: [facility] ORS;  Service: Vascular;  Laterality: Right;  . CARDIAC CATHETERIZATION    . CATARACT EXTRACTION Bilateral   . CHOLECYSTECTOMY N/A 12/09/2014   Procedure: LAPAROSCOPIC CHOLECYSTECTOMY;  Surgeon: Marlyce [name], MD;  Location: [facility] ORS;  Service: General;  Laterality: N/A;  . CORONARY ARTERY BYPASS GRAFT N/A 07/02/2013   Procedure: CORONARY ARTERY BYPASS GRAFTING (CABG);  Surgeon: Ivin [name], MD;  Location: [place];  Service: Open Heart Surgery;  Laterality: N/A;  CABG x three, using left internal mammary artery and right leg greater saphenous vein harvested endoscopically  . ESOPHAGOGASTRODUODENOSCOPY (EGD) WITH PROPOFOL N/A 11/24/2015   Procedure: ESOPHAGOGASTRODUODENOSCOPY (EGD) WITH PROPOFOL;  Surgeon: [name], MD;  Location: [place];  Service: Endoscopy;  Laterality: N/A;  Diabetic - insulin  . EYE SURGERY Bilateral    Cataract Extraction with IOL  . INTRAOPERATIVE TRANSESOPHAGEAL ECHOCARDIOGRAM N/A 07/02/2013   Procedure: INTRAOPERATIVE TRANSESOPHAGEAL ECHOCARDIOGRAM;  Surgeon: Ivin [name], MD;  Location: [place];  Service: Open Heart Surgery;  Laterality: N/A;  . PEG PLACEMENT N/A 03/03/2019   Procedure: PERCUTANEOUS ENDOSCOPIC GASTROSTOMY (PEG) PLACEMENT;  Surgeon: Virgel Manifold, MD;  Location: [facility] ENDOSCOPY;  Service: Endoscopy;  Laterality: N/A;  . PERIPHERAL VASCULAR CATHETERIZATION Right 12/06/2015   Procedure: Dialysis/Perma  Catheter Insertion;  Surgeon: Katha Cabal, MD;  Location: [place] CV LAB;  Service: Cardiovascular;  Laterality: Right;  . PERIPHERAL VASCULAR THROMBECTOMY Right 09/28/2016   Procedure: Peripheral Vascular Thrombectomy;  Surgeon: Katha Cabal, MD;  Location: [place] CV LAB;  Service: Cardiovascular;  Laterality: Right;  . PERIPHERAL VASCULAR THROMBECTOMY Right 05/20/2018   Procedure: PERIPHERAL VASCULAR THROMBECTOMY;  Surgeon: Katha Cabal, MD;  Location: [place] CV LAB;  Service: Cardiovascular;  Laterality: Right;  . PORTA CATH REMOVAL N/A 06/01/2016   Procedure: Glori [name] Cath Removal;  Surgeon: Katha Cabal, MD;  Location: [place] CV LAB;  Service: Cardiovascular;  Laterality: N/A;  . THORACENTESIS Left 2015    Prior to Admission medications   Medication Sig Start Date End Date Taking? Authorizing Provider  ACCU-CHEK FASTCLIX LANCETS MISC Check blood glucose twice daily, diagnosis code E11.9 12/13/16  Yes [name], MD  acetaminophen (TYLENOL) 325 MG tablet Take 650 mg by mouth daily as needed for moderate pain or headache.    Yes [provider]  Alirocumab (PRALUENT) 75 MG/ML SOAJ Inject 75 mg into the skin every 14 (fourteen) days. 04/04/18  Yes Wellington Hampshire, MD  Amino Acids-Protein Hydrolys (FEEDING SUPPLEMENT, PRO-STAT SUGAR FREE 64,) LIQD Take 30 mLs by mouth 3 (three) times daily between  meals. 09/09/18  Yes Hosie Poisson, MD  amiodarone (PACERONE) 200 MG tablet Take 200 mg by mouth daily.   Yes [provider]  aspirin EC 81 MG tablet Take 81 mg by mouth daily.   Yes [provider]  atorvastatin (LIPITOR) 40 MG tablet Take 40 mg by mouth daily.   Yes [provider]  Blood Glucose Monitoring Suppl (ACCU-CHEK AVIVA PLUS) w/Device KIT Use as directed to test blood sugar up to three times daily 05/06/17  Yes [name], MD  Calcium Acetate 667 MG TABS Take 2,001 mg by mouth 3 (three) times daily  with meals.   Yes [provider]  ELIQUIS 5 MG TABS tablet Take 5 mg by mouth 2 (two) times daily.  01/20/19  Yes [provider]  ferrous sulfate 325 (65 FE) MG tablet Take 325 mg by mouth daily with breakfast.   Yes [provider]  FLUoxetine (PROZAC) 20 MG capsule Take 20 mg by mouth daily.   Yes [provider]  fluticasone (FLONASE) 50 MCG/ACT nasal spray Place 2 sprays into both nostrils daily. Patient taking differently: Place 2 sprays into both nostrils daily as needed for allergies or rhinitis.  09/17/17  Yes [name], MD  furosemide (LASIX) 20 MG tablet Take 40 mg by mouth every other day. Non-dialysis days    Yes [provider]  gabapentin (NEURONTIN) 300 MG capsule Take 300 mg by mouth 2 (two) times daily.  01/20/19  Yes [provider]  glucose blood (ACCU-CHEK AVIVA) test strip Use as instructed to test blood sugar up to 3 times daily 05/06/17  Yes [name], MD  Insulin Glargine (BASAGLAR KWIKPEN) 100 UNIT/ML SOPN Inject 18 Units into the skin daily.   Yes [provider]  Lancets (ACCU-CHEK SOFT TOUCH) lancets Use as instructed 05/06/17  Yes [name], MD  levothyroxine (SYNTHROID) 88 MCG tablet Take 88 mcg by mouth daily before breakfast.   Yes [provider]  lidocaine-prilocaine (EMLA) cream Apply 1 application topically as needed (port access).   Yes [provider]  midodrine (PROAMATINE) 10 MG tablet Take 1 tablet (10 mg total) by mouth 3 (three) times daily with meals. Patient taking differently: Take 10 mg by mouth 3 (three) times daily as needed (low BP).  09/09/18  Yes Hosie Poisson, MD  multivitamin (RENA-VIT) TABS tablet Take 1 tablet by mouth at bedtime. 09/09/18  Yes Hosie Poisson, MD  nitroGLYCERIN (NITROSTAT) 0.4 MG SL tablet DISSOLVE ONE TABLET UNDER THE TONGUE EVERY 5 MINUTES AS NEEDED FOR CHEST PAIN.  DO NOT EXCEED A TOTAL OF 3 DOSES IN 15 MINUTES Patient taking  differently: Place 0.4 mg under the tongue every 5 (five) minutes as needed for chest pain. DISSOLVE ONE TABLET UNDER THE TONGUE EVERY 5 MINUTES AS NEEDED FOR CHEST PAIN.  DO NOT EXCEED A TOTAL OF 3 DOSES IN 15 MINUTES 08/18/18  Yes Wellington Hampshire, MD  ondansetron (ZOFRAN-ODT) 4 MG disintegrating tablet Take 1 tablet (4 mg total) by mouth every 8 (eight) hours as needed for nausea or vomiting. appt with PCP further refills Dr. Caryl Bis 01/28/19  Yes McLean-Scocuzza, Nino Glow, MD  pantoprazole (PROTONIX) 40 MG tablet Take 40 mg by mouth daily. 01/20/19  Yes [provider]  vitamin C (VITAMIN C) 500 MG tablet Take 1 tablet (500 mg total) by mouth daily. 09/10/18  Yes Hosie Poisson, MD  zinc sulfate 220 (50 Zn) MG capsule Take 1 capsule (220 mg total)  by mouth daily. 09/10/18  Yes Hosie Poisson, MD  mirtazapine (REMERON) 15 MG tablet Take 1 tablet (15 mg total) by mouth at bedtime. Patient not taking: Reported on 02/09/2019 09/11/18   Mercy Riding, MD    Current Facility-Administered Medications:  .  0.9 %  sodium chloride infusion, , Intravenous, PRN, Algernon Huxley, MD, Last Rate: 5 mL/hr at 03/23/19 1852, 20 mL at 03/23/19 1852 .  acetaminophen (TYLENOL) tablet 650 mg, 650 mg, Oral, Q4H PRN, Lorella Nimrod, MD, 650 mg at 04/08/19 1639 .  acidophilus (RISAQUAD) capsule 2 capsule, 2 capsule, Oral, BID, Thornell Mule, MD, 2 capsule at 04/08/19 2044 .  alteplase (CATHFLO ACTIVASE) injection 2 mg, 2 mg, Intracatheter, Once, Kolluru, Sarath, MD .  alteplase (CATHFLO ACTIVASE) injection 2 mg, 2 mg, Intracatheter, Once, Kolluru, Sarath, MD .  apixaban (ELIQUIS) tablet 5 mg, 5 mg, Oral, BID, Thornell Mule, MD, 5 mg at 04/08/19 2045 .  aspirin chewable tablet 81 mg, 81 mg, Per Tube, Daily, Dallie Piles, RPH, 81 mg at 04/08/19 1024 .  B-complex with vitamin C tablet 1 tablet, 1 tablet, Per Tube, Daily, Dew, Erskine Squibb, MD, 1 tablet at 04/08/19 2045 .  Chlorhexidine Gluconate Cloth 2 % PADS 6 each, 6  each, Topical, Q0600, Algernon Huxley, MD, 6 each at 04/09/19 0531 .  dextrose 5 %-0.45 % sodium chloride infusion, , Intravenous, Continuous, [place], Bing Neighbors, NP, Last Rate: 50 mL/hr at 04/09/19 1601, Rate Verify at 04/09/19 1601 .  epoetin alfa (EPOGEN) injection 10,000 Units, 10,000 Units, Intravenous, Q M,W,F-HD, Kolluru, Sarath, MD, 10,000 Units at 04/08/19 1136 .  feeding supplement (OSMOLITE 1.5 CAL) liquid 1,000 mL, 1,000 mL, Per Tube, Continuous, Val Riles, MD, Last Rate: 10 mL/hr at 04/08/19 2045, 1,000 mL at 04/08/19 2045 .  feeding supplement (PRO-STAT SUGAR FREE 64) liquid 30 mL, 30 mL, Per Tube, Daily, Aleskerov, Fuad, MD, 30 mL at 04/08/19 1626 .  [START ON 04/17/2019] ferrous gluconate (FERGON) tablet 324 mg, 324 mg, Oral, Daily, Dwyane Dee, Dileep, MD .  fluticasone (FLONASE) 50 MCG/ACT nasal spray 2 spray, 2 spray, Each Nare, Daily, Dew, Erskine Squibb, MD, 2 spray at 04/07/19 [phone] .  folic acid (FOLVITE) tablet 1 mg, 1 mg, Per Tube, Daily, [name], MD, 1 mg at 04/08/19 1024 .  free water 20 mL, 20 mL, Per Tube, Q4H, Aleskerov, Fuad, MD, 20 mL at 04/09/19 0330 .  insulin aspart (novoLOG) injection 0-15 Units, 0-15 Units, Subcutaneous, Q4H, Algernon Huxley, MD, 2 Units at 04/08/19 0037 .  insulin glargine (LANTUS) injection 10 Units, 10 Units, Subcutaneous, Daily, Enzo Bi, MD, 10 Units at 04/09/19 1053 .  ipratropium-albuterol (DUONEB) 0.5-2.5 (3) MG/3ML nebulizer solution 3 mL, 3 mL, Nebulization, Q4H PRN, Dew, Erskine Squibb, MD .  levothyroxine (SYNTHROID) tablet 100 mcg, 100 mcg, Per Tube, QAC breakfast, Dallie Piles, RPH, 100 mcg at 04/07/19 5885 .  lip balm (BLISTEX) ointment, , Topical, PRN, Val Riles, MD .  MEDLINE mouth rinse, 15 mL, Mouth Rinse, BID, Darel Hong D, NP, 15 mL at 04/08/19 2048 .  metoCLOPramide (REGLAN) injection 10 mg, 10 mg, Intravenous, Q6H, Sharion Settler, NP, 10 mg at 04/09/19 1317 .  midodrine (PROAMATINE) tablet 10 mg, 10 mg, Oral, Q M,W,F-HD,  Lateef, Munsoor, MD, 10 mg at 04/06/19 1429 .  naphazoline-glycerin (CLEAR EYES REDNESS) ophth solution 1-2 drop, 1-2 drop, Both Eyes, QID PRN, Val Riles, MD, 1 drop at 04/09/19 0743 .  ondansetron (ZOFRAN) injection 4 mg, 4 mg,  Intravenous, Q6H PRN, Algernon Huxley, MD, 4 mg at 04/07/19 1231 .  pantoprazole sodium (PROTONIX) 40 mg/20 mL oral suspension 40 mg, 40 mg, Per Tube, BID, Enzo Bi, MD, 40 mg at 04/09/19 1057 .  promethazine (PHENERGAN) 6.25 MG/5ML syrup 12.5 mg, 12.5 mg, Per Tube, Q8H PRN, [name], MD, 12.5 mg at 04/07/19 2159 .  promethazine (PHENERGAN) injection 12.5 mg, 12.5 mg, Intravenous, Q6H PRN, Sharion Settler, NP, 12.5 mg at 04/09/19 4431 .  sodium chloride (OCEAN) 0.65 % nasal spray 1 spray, 1 spray, Each Nare, PRN, Val Riles, MD .  traMADol Veatrice Bourbon) tablet 50 mg, 50 mg, Oral, Q12H PRN, Lorella Nimrod, MD, 50 mg at 04/08/19 2229   Family History  Problem Relation Age of Onset  . COPD Mother   . Cancer Mother        Lung  . Pulmonary embolism Father   . Diabetes Father   . Diabetes Paternal Grandfather   . Heart disease Maternal Grandmother   . Colon cancer Neg Hx   . Colon polyps Neg Hx   . Esophageal cancer Neg Hx   . Pancreatic cancer Neg Hx   . Liver disease Neg Hx      Social History   Tobacco Use  . Smoking status: Never Smoker  . Smokeless tobacco: Never Used  Substance Use Topics  . Alcohol use: No    Alcohol/week: 0.0 standard drinks  . Drug use: No    Allergies as of 02/09/2019 - Review Complete 02/09/2019  Allergen Reaction Noted  . Nsaids Other (See Comments) 04/03/2016  . Doxycycline Other (See Comments) 05/14/2017    Review of Systems:    All systems reviewed and negative except where noted in HPI.   Physical Exam:  Vital signs in last 24 hours: Temp:  [97.5 F (36.4 C)-97.8 F (36.6 C)] 97.8 F (36.6 C) (02/04 0421) Pulse Rate:  [72-83] 73 (02/04 1247) Resp:  [18-20] 18 (02/04 1247) BP: (136-147)/(51-64) 147/58 (02/04  1247) SpO2:  [97 %-100 %] 100 % (02/04 1247) Last BM Date: 04/08/19 General: Lethargic, able to follow simple commands Head:  Normocephalic and atraumatic. Eyes:   No icterus.   Conjunctiva pink. PERRLA. Ears:  Normal auditory acuity. Neck:  Supple; no masses or thyroidomegaly Lungs: Respirations even and unlabored. Lungs clear to auscultation bilaterally.   No wheezes, crackles, or rhonchi.  Heart:  Regular rate and rhythm;  Without murmur, clicks, rubs or gallops Abdomen:  Soft, nondistended, nontender. Normal bowel sounds. No appreciable masses or hepatomegaly.  No rebound or guarding, PEG site appeared healthy with no bleeding, or purulent discharge.  External bumper at 5 cm mark Rectal:  Not performed. Msk: Right upper extremity contracture Extremities: 2+ edema, NO cyanosis or clubbing.   LAB RESULTS: CBC Latest Ref Rng & Units 04/08/2019 04/07/2019 04/06/2019  WBC 4.0 - 10.5 K/uL 5.1 5.6 5.2  Hemoglobin 12.0 - 15.0 g/dL 8.7(L) 9.5(L) 6.9(L)  Hematocrit 36.0 - 46.0 % 27.0(L) 29.9(L) 22.5(L)  Platelets 150 - 400 K/uL 243 257 248    BMET BMP Latest Ref Rng & Units 04/09/2019 04/08/2019 04/07/2019  Glucose 70 - 99 mg/dL 90 88 126(H)  BUN 8 - 23 mg/dL 19 22 24(H)  Creatinine 0.44 - 1.00 mg/dL 1.98(H) 1.74(H) 1.89(H)  Sodium 135 - 145 mmol/L 141 141 135  Potassium 3.5 - 5.1 mmol/L 4.4 3.8 5.0  Chloride 98 - 111 mmol/L 96(L) 99 97(L)  CO2 22 - 32 mmol/L 33(H) 34(H) 31  Calcium 8.9 - 10.3  mg/dL 9.1 8.3(L) 9.0    LFT Hepatic Function Latest Ref Rng & Units 04/09/2019 04/08/2019 04/07/2019  Total Protein 6.5 - 8.1 g/dL - - -  Albumin 3.5 - 5.0 g/dL 2.1(L) 2.0(L) 2.2(L)  AST 15 - 41 U/L - - -  ALT 0 - 44 U/L - - -  Alk Phosphatase 38 - 126 U/L - - -  Total Bilirubin 0.3 - 1.2 mg/dL - - -  Bilirubin, Direct 0.0 - 0.2 mg/dL - - -     STUDIES: DG Abd 1 View  Result Date: 04/09/2019 CLINICAL DATA:  Abdominal pain. EXAM: ABDOMEN - 1 VIEW COMPARISON:  Abdomen 04/05/2019. Chest x-ray 03/04/2019.  CT 10/23/2015. FINDINGS: Prior CABG. Surgical clips right upper quadrant. Tubing noted over the abdomen. Soft tissues the abdomen are unremarkable. Stool noted throughout the colon. No bowel distention or free air. Prominent skin folds again noted. Stable vascular calcification. Bibasilar subsegmental atelectasis and or scarring. Stable left pleural thickening noted most likely scarring. Degenerative change thoracolumbar spine and both hips. Diffuse osteopenia. No acute bony abnormality identified. IMPRESSION: No acute abnormality identified. Electronically Signed   By: Marcello Moores  Register   On: 04/09/2019 08:48      Impression / Plan:   Jamie Arias is a 64 y.o. female with history of COVID-19 infection in 0/5397, complicated by MRSA bacteremia prolonged hospitalization, on hemodialysis, DVT, A. fib, CHF on Eliquis, s/p PEG on 03/03/2019.  GI is consulted due to PEG malfunction and nausea The external bumper is at 5 cm mark and rotatable 360 degrees without any resistance.  I tried to push water from both the ports, met resistance as well as the external tubing is getting inflated.  Hold tube feeds In order to proceed with upper endoscopy and PEG replacement, Eliquis needs to be held for 3 days before proceeding with these procedures Last dose of Eliquis was on 2/3 Tentative plan to perform upper endoscopy to evaluate nausea and PEG replacement due to malfunction either over the weekend or early next week  Thank you for involving me in the care of this patient.      LOS: 8 days   Sherri Sear, MD  04/09/2019, 5:07 PM   Note: This dictation was prepared with Dragon dictation along with smaller phrase technology. Any transcriptional errors that result from this process are unintentional.

## 2019-04-09 NOTE — Progress Notes (Signed)
[facility]  ROUNDING NOTE   Subjective:   Husband at bedside. Much more talkative.  Hemodialysis treatment yesterday. UF 1 liter  Objective:  Vital signs in last 24 hours:  Temp:  [97.5 F (36.4 C)-98.6 F (37 C)] 97.8 F (36.6 C) (02/04 0421) Pulse Rate:  [71-90] 73 (02/04 1247) Resp:  [16-22] 18 (02/04 1247) BP: (84-154)/(51-69) 147/58 (02/04 1247) SpO2:  [97 %-100 %] 100 % (02/04 1247)  Weight change:  Filed Weights   03/27/19 1045 03/31/19 1220 04/07/19 1700  Weight: 81 kg 83.5 kg 84.6 kg    Intake/Output: I/O last 3 completed shifts: In: 246.8 [I.V.:246.8] Out: 1000 [Other:1000]   Intake/Output this shift:  Total I/O In: 53.8 [I.V.:53.8] Out: -   Physical Exam: General: NAD, laying in bed  Head: Normocephalic, atraumatic.  Eyes: Anicteric, PERRL  Neck: Supple, trachea midline  Lungs:  Clear to auscultation  Heart: Regular rate and rhythm  Abdomen:  Soft, nontender,   Extremities:  right upper extremity edema.  Neurologic: Verbal but not consistent with answers, +tremor  Skin: No lesions  Access: LIJ permcath    Basic Metabolic Panel: Recent Labs  Lab 04/05/19 0507 04/05/19 0507 04/06/19 0930 04/06/19 0930 04/07/19 0538 04/08/19 1201 04/09/19 0742  NA 135  --  134*  --  135 141 141  K 4.8  --  5.1  --  5.0 3.8 4.4  CL 91*  --  93*  --  97* 99 96*  CO2 30  --  33*  --  31 34* 33*  GLUCOSE 170*  --  159*  --  126* 88 90  BUN 34*  --  46*  --  24* 22 19  CREATININE 2.45*  --  3.00*  --  1.89* 1.74* 1.98*  CALCIUM 8.6*   < > 8.4*   < > 9.0 8.3* 9.1  PHOS 4.4  --  4.6  --  3.8 2.9 3.7   < > = values in this interval not displayed.    Liver Function Tests: Recent Labs  Lab 04/05/19 0507 04/06/19 0930 04/07/19 0538 04/08/19 1201 04/09/19 0742  ALBUMIN 1.9* 1.9* 2.2* 2.0* 2.1*   No results for input(s): LIPASE, AMYLASE in the last 168 hours. No results for input(s): AMMONIA in the last 168 hours.  CBC: Recent Labs  Lab  04/03/19 1434 04/06/19 0500 04/07/19 0538 04/08/19 1201  WBC 5.0 5.2 5.6 5.1  HGB 7.3* 6.9* 9.5* 8.7*  HCT 23.3* 22.5* 29.9* 27.0*  MCV 99.6 100.4* 96.5 96.4  PLT 235 248 257 243    Cardiac Enzymes: No results for input(s): CKTOTAL, CKMB, CKMBINDEX, TROPONINI in the last 168 hours.  BNP: Invalid input(s): POCBNP  CBG: Recent Labs  Lab 04/09/19 0041 04/09/19 0359 04/09/19 0451 04/09/19 0756 04/09/19 1243  GLUCAP 127* 79 117* 77 81*    Microbiology: Results for orders placed or performed during the hospital encounter of 02/09/19  Culture, blood (routine x 2)     Status: Abnormal   Collection Time: 02/09/19  2:29 PM   Specimen: BLOOD LEFT ARM  Result Value Ref Range Status   Specimen Description   Final    BLOOD LEFT ARM Performed at [facility], [address] [place], [place] [address]    Special Requests   Final    BOTTLES DRAWN AEROBIC AND ANAEROBIC Blood Culture adequate volume Performed at [facility], [address], [place], [place] [address]    Culture  Setup Time   Final  GRAM POSITIVE COCCI IN BOTH AEROBIC AND ANAEROBIC BOTTLES CRITICAL VALUE NOTED.  VALUE IS CONSISTENT WITH PREVIOUSLY REPORTED AND CALLED VALUE. Performed at [facility], [address] [place], [place] [address]    Culture (A)  Final    STAPHYLOCOCCUS AUREUS SUSCEPTIBILITIES PERFORMED ON PREVIOUS CULTURE WITHIN THE LAST 5 DAYS. Performed at [facility], [place] [address], [place], [place] [address]    Report Status 02/12/2019 FINAL  Final  Culture, blood (routine x 2)     Status: Abnormal   Collection Time: 02/09/19  2:38 PM   Specimen: BLOOD LEFT ARM  Result Value Ref Range Status   Specimen Description   Final    BLOOD LEFT ARM Performed at [facility], [address] [place], [place] [address]    Special Requests   Final    BOTTLES DRAWN AEROBIC AND ANAEROBIC Blood Culture adequate volume Performed at [facility], [address], [place], [place] [address]    Culture  Setup Time   Final    GRAM POSITIVE COCCI IN BOTH AEROBIC AND ANAEROBIC BOTTLES CRITICAL RESULT CALLED TO, READ BACK BY AND VERIFIED WITH: [place] [id number] 02/10/2019 HNM Performed at [facility], [place] [address], [place], [place] [address]    Culture METHICILLIN RESISTANT STAPHYLOCOCCUS AUREUS (A)  Final   Report Status 02/12/2019 FINAL  Final   Organism ID, Bacteria METHICILLIN RESISTANT STAPHYLOCOCCUS AUREUS  Final      Susceptibility   Methicillin resistant staphylococcus aureus - MIC*    CIPROFLOXACIN >=8 RESISTANT Resistant     ERYTHROMYCIN >=8 RESISTANT Resistant     GENTAMICIN <=0.5 SENSITIVE Sensitive     OXACILLIN >=4 RESISTANT Resistant     TETRACYCLINE <=1 SENSITIVE Sensitive     VANCOMYCIN 1 SENSITIVE Sensitive     TRIMETH/SULFA <=10 SENSITIVE Sensitive     CLINDAMYCIN RESISTANT Resistant     RIFAMPIN <=0.5 SENSITIVE Sensitive     Inducible Clindamycin POSITIVE Resistant     * METHICILLIN RESISTANT STAPHYLOCOCCUS AUREUS  Blood Culture ID Panel (Reflexed)     Status: Abnormal   Collection Time: 02/09/19  2:38 PM  Result Value Ref Range Status   Enterococcus species NOT DETECTED NOT DETECTED Final   Listeria monocytogenes NOT DETECTED NOT DETECTED Final   Staphylococcus species DETECTED (A) NOT DETECTED Final    Comment: CRITICAL RESULT CALLED TO, READ BACK BY AND VERIFIED WITH: Hart Robinsons PHARMD [id number] 02/10/2019 HNM    Staphylococcus aureus (BCID) DETECTED (A) NOT DETECTED Final    Comment: Methicillin (oxacillin)-resistant Staphylococcus aureus (MRSA). MRSA is predictably resistant to beta-lactam antibiotics (except ceftaroline). Preferred therapy is vancomycin unless clinically contraindicated. Patient requires contact precautions if  hospitalized. CRITICAL RESULT CALLED TO, READ BACK BY AND VERIFIED WITH: Hart Robinsons PHARMD [id number] 02/10/2019 HNM    Methicillin resistance DETECTED (A) NOT DETECTED  Final    Comment: CRITICAL RESULT CALLED TO, READ BACK BY AND VERIFIED WITH: Hart Robinsons PHARMD [id number] 02/10/2019 HNM    Streptococcus species NOT DETECTED NOT DETECTED Final   Streptococcus agalactiae NOT DETECTED NOT DETECTED Final   Streptococcus pneumoniae NOT DETECTED NOT DETECTED Final   Streptococcus pyogenes NOT DETECTED NOT DETECTED Final   Acinetobacter baumannii NOT DETECTED NOT DETECTED Final   Enterobacteriaceae species NOT DETECTED NOT DETECTED Final   Enterobacter cloacae complex NOT DETECTED NOT DETECTED Final   Escherichia coli NOT DETECTED NOT DETECTED Final   Klebsiella oxytoca NOT DETECTED NOT DETECTED Final   Klebsiella pneumoniae  NOT DETECTED NOT DETECTED Final   Proteus species NOT DETECTED NOT DETECTED Final   Serratia marcescens NOT DETECTED NOT DETECTED Final   Haemophilus influenzae NOT DETECTED NOT DETECTED Final   Neisseria meningitidis NOT DETECTED NOT DETECTED Final   Pseudomonas aeruginosa NOT DETECTED NOT DETECTED Final   Candida albicans NOT DETECTED NOT DETECTED Final   Candida glabrata NOT DETECTED NOT DETECTED Final   Candida krusei NOT DETECTED NOT DETECTED Final   Candida parapsilosis NOT DETECTED NOT DETECTED Final   Candida tropicalis NOT DETECTED NOT DETECTED Final    Comment: Performed at [facility], [place], [place] [address]  SARS CORONAVIRUS 2 (TAT 6-24 HRS) Nasopharyngeal Nasopharyngeal Swab     Status: None   Collection Time: 02/09/19  2:54 PM   Specimen: Nasopharyngeal Swab  Result Value Ref Range Status   SARS Coronavirus 2 NEGATIVE NEGATIVE Final    Comment: (NOTE) SARS-CoV-2 target nucleic acids are NOT DETECTED. The SARS-CoV-2 RNA is generally detectable in upper and lower respiratory specimens during the acute phase of infection. Negative results do not preclude SARS-CoV-2 infection, do not rule out co-infections with other pathogens, and should not be used as the sole basis for treatment or other  patient management decisions. Negative results must be combined with clinical observations, patient history, and epidemiological information. The expected result is Negative. Fact Sheet for Patients: SugarRoll.be Fact Sheet for Healthcare Providers: https://www.woods-mathews.com/ This test is not yet approved or cleared by the [place] FDA and  has been authorized for detection and/or diagnosis of SARS-CoV-2 by FDA under an Emergency Use Authorization (EUA). This EUA will remain  in effect (meaning this test can be used) for the duration of the COVID-19 declaration under Section 56 4(b)(1) of the Act, 21 U.S.C. section 360bbb-3(b)(1), unless the authorization is terminated or revoked sooner. Performed at [facility], [place] [address] [place], [place] [address]   MRSA PCR Screening     Status: Abnormal   Collection Time: 02/10/19  5:03 PM   Specimen: Nasopharyngeal  Result Value Ref Range Status   MRSA by PCR POSITIVE (A) NEGATIVE Final    Comment:        The GeneXpert MRSA Assay (FDA approved for NASAL specimens only), is one component of a comprehensive MRSA colonization surveillance program. It is not intended to diagnose MRSA infection nor to guide or monitor treatment for MRSA infections. RESULT CALLED TO, READ BACK BY AND VERIFIED WITH: ALEKSEY FRASER @1839  02/10/19 MJU Performed at [facility], [address] [place], [place] [address]   Culture, blood (routine x 2)     Status: Abnormal   Collection Time: 02/11/19 12:31 PM   Specimen: BLOOD  Result Value Ref Range Status   Specimen Description   Final    BLOOD PORTA CATH Performed at [place] [address] [place], [place] [address]    Special Requests   Final    BOTTLES DRAWN AEROBIC AND ANAEROBIC Blood Culture adequate volume Performed at [facility], [address] [place], [place] [address]    Culture  Setup Time   Final     GRAM POSITIVE COCCI ANAEROBIC BOTTLE ONLY CRITICAL RESULT CALLED TO, READ BACK BY AND VERIFIED WITH: SCOTT HALL AT [id number] 02/12/2019 [facility]  Performed at [facility], [address] [place], [place] [address]    Culture (A)  Final    STAPHYLOCOCCUS AUREUS SUSCEPTIBILITIES PERFORMED ON PREVIOUS CULTURE WITHIN THE LAST 5 DAYS.  Performed at [facility], [place] [address], [place], [place] [address]    Report Status 02/13/2019 FINAL  Final  Culture, blood (routine x 2)     Status: None   Collection Time: 02/11/19  3:57 PM   Specimen: BLOOD  Result Value Ref Range Status   Specimen Description BLOOD PORTA CATH  Final   Special Requests   Final    BOTTLES DRAWN AEROBIC AND ANAEROBIC Blood Culture adequate volume   Culture   Final    NO GROWTH 5 DAYS Performed at [facility], [address] [place], [place] [address]    Report Status 02/16/2019 FINAL  Final  CULTURE, BLOOD (ROUTINE X 2) w Reflex to ID Panel     Status: Abnormal   Collection Time: 02/13/19  2:18 PM   Specimen: BLOOD LEFT HAND  Result Value Ref Range Status   Specimen Description BLOOD LEFT HAND  Final   Special Requests   Final    BOTTLES DRAWN AEROBIC AND ANAEROBIC Blood Culture results may not be optimal due to an inadequate volume of blood received in culture bottles   Culture  Setup Time   Final    AEROBIC BOTTLE ONLY GRAM POSITIVE COCCI IN CLUSTERS CRITICAL RESULT CALLED TO, READ BACK BY AND VERIFIED WITH: Herbert Pun AT [id number] ON 02/14/2019 [place]. Performed at [facility], [place] [address] [place], [place] [address]    Culture METHICILLIN RESISTANT STAPHYLOCOCCUS AUREUS (A)  Final   Report Status 02/16/2019 FINAL  Final   Organism ID, Bacteria METHICILLIN RESISTANT STAPHYLOCOCCUS AUREUS  Final      Susceptibility   Methicillin resistant staphylococcus aureus - MIC*    CIPROFLOXACIN >=8 RESISTANT Resistant     ERYTHROMYCIN >=8 RESISTANT Resistant     GENTAMICIN <=0.5 SENSITIVE  Sensitive     OXACILLIN >=4 RESISTANT Resistant     TETRACYCLINE <=1 SENSITIVE Sensitive     VANCOMYCIN <=0.5 SENSITIVE Sensitive     TRIMETH/SULFA <=10 SENSITIVE Sensitive     CLINDAMYCIN RESISTANT Resistant     RIFAMPIN <=0.5 SENSITIVE Sensitive     Inducible Clindamycin POSITIVE Resistant     * METHICILLIN RESISTANT STAPHYLOCOCCUS AUREUS  Blood Culture ID Panel (Reflexed)     Status: Abnormal   Collection Time: 02/13/19  2:18 PM  Result Value Ref Range Status   Enterococcus species NOT DETECTED NOT DETECTED Final   Listeria monocytogenes NOT DETECTED NOT DETECTED Final   Staphylococcus species DETECTED (A) NOT DETECTED Final    Comment: CRITICAL RESULT CALLED TO, READ BACK BY AND VERIFIED WITH: ABBEY ELLINGTON AT [id number] ON 02/14/2019 [place].    Staphylococcus aureus (BCID) DETECTED (A) NOT DETECTED Final    Comment: Methicillin (oxacillin)-resistant Staphylococcus aureus (MRSA). MRSA is predictably resistant to beta-lactam antibiotics (except ceftaroline). Preferred therapy is vancomycin unless clinically contraindicated. Patient requires contact precautions if  hospitalized. CRITICAL RESULT CALLED TO, READ BACK BY AND VERIFIED WITH: Herbert Pun AT [id number] ON 02/14/2019 [place].    Methicillin resistance DETECTED (A) NOT DETECTED Final    Comment: CRITICAL RESULT CALLED TO, READ BACK BY AND VERIFIED WITH: Herbert Pun AT [id number] ON 02/14/2019 [place].    Streptococcus species NOT DETECTED NOT DETECTED Final   Streptococcus agalactiae NOT DETECTED NOT DETECTED Final   Streptococcus pneumoniae NOT DETECTED NOT DETECTED Final   Streptococcus pyogenes NOT DETECTED NOT DETECTED Final   Acinetobacter baumannii NOT DETECTED NOT DETECTED Final   Enterobacteriaceae species NOT DETECTED NOT DETECTED Final   Enterobacter cloacae complex  NOT DETECTED NOT DETECTED Final   Escherichia coli NOT DETECTED NOT DETECTED Final   Klebsiella oxytoca NOT DETECTED NOT DETECTED Final   Klebsiella pneumoniae NOT  DETECTED NOT DETECTED Final   Proteus species NOT DETECTED NOT DETECTED Final   Serratia marcescens NOT DETECTED NOT DETECTED Final   Haemophilus influenzae NOT DETECTED NOT DETECTED Final   Neisseria meningitidis NOT DETECTED NOT DETECTED Final   Pseudomonas aeruginosa NOT DETECTED NOT DETECTED Final   Candida albicans NOT DETECTED NOT DETECTED Final   Candida glabrata NOT DETECTED NOT DETECTED Final   Candida krusei NOT DETECTED NOT DETECTED Final   Candida parapsilosis NOT DETECTED NOT DETECTED Final   Candida tropicalis NOT DETECTED NOT DETECTED Final    Comment: Performed at [facility], [address] [place], [place] [address]  CULTURE, BLOOD (ROUTINE X 2) w Reflex to ID Panel     Status: None   Collection Time: 02/13/19  3:09 PM   Specimen: BLOOD  Result Value Ref Range Status   Specimen Description BLOOD BLOOD LEFT HAND  Final   Special Requests   Final    BOTTLES DRAWN AEROBIC AND ANAEROBIC Blood Culture adequate volume   Culture   Final    NO GROWTH 5 DAYS Performed at [facility], [address] [place], [place] [address]    Report Status 02/18/2019 FINAL  Final  Culture, blood (routine x 2)     Status: Abnormal   Collection Time: 02/15/19 12:49 PM   Specimen: BLOOD  Result Value Ref Range Status   Specimen Description   Final    BLOOD LT HAND Performed at [facility], [address] [place], [place] [address]    Special Requests   Final    BOTTLES DRAWN AEROBIC AND ANAEROBIC Blood Culture results may not be optimal due to an inadequate volume of blood received in culture bottles Performed at [facility], [address] [place], [place] [address]    Culture  Setup Time   Final    GRAM POSITIVE COCCI IN BOTH AEROBIC AND ANAEROBIC BOTTLES CRITICAL RESULT CALLED TO, READ BACK BY AND VERIFIED WITHEleonore Chiquito AT [id number] 02/16/2019 SDR Performed at [facility], [address] [place], [place] [address]     Culture (A)  Final    METHICILLIN RESISTANT STAPHYLOCOCCUS AUREUS STAPHYLOCOCCUS SPECIES (COAGULASE NEGATIVE) THE SIGNIFICANCE OF ISOLATING THIS ORGANISM FROM A SINGLE SET OF BLOOD CULTURES WHEN MULTIPLE SETS ARE DRAWN IS UNCERTAIN. PLEASE NOTIFY THE MICROBIOLOGY DEPARTMENT WITHIN ONE WEEK IF SPECIATION AND SENSITIVITIES ARE REQUIRED. Performed at [facility], [place] [address], [place], [place] [address]    Report Status 02/18/2019 FINAL  Final   Organism ID, Bacteria METHICILLIN RESISTANT STAPHYLOCOCCUS AUREUS  Final      Susceptibility   Methicillin resistant staphylococcus aureus - MIC*    CIPROFLOXACIN >=8 RESISTANT Resistant     ERYTHROMYCIN >=8 RESISTANT Resistant     GENTAMICIN <=0.5 SENSITIVE Sensitive     OXACILLIN >=4 RESISTANT Resistant     TETRACYCLINE <=1 SENSITIVE Sensitive     VANCOMYCIN <=0.5 SENSITIVE Sensitive     TRIMETH/SULFA <=10 SENSITIVE Sensitive     CLINDAMYCIN RESISTANT Resistant     RIFAMPIN <=0.5 SENSITIVE Sensitive     Inducible Clindamycin POSITIVE Resistant     * METHICILLIN RESISTANT STAPHYLOCOCCUS AUREUS  Aerobic Culture (superficial specimen)     Status: None   Collection Time: 02/17/19  1:31 PM   Specimen: Heel; Wound  Result  Value Ref Range Status   Specimen Description   Final    HEEL Performed at [facility], [address], [place], [place] [address]    Special Requests   Final    NONE Performed at [facility], [address] [place], [place] [address]    Gram Stain   Final    NO WBC SEEN MODERATE GRAM POSITIVE COCCI IN PAIRS    Culture   Final    ABUNDANT METHICILLIN RESISTANT STAPHYLOCOCCUS AUREUS ABUNDANT DIPHTHEROIDS(CORYNEBACTERIUM SPECIES) Standardized susceptibility testing for this organism is not available. Performed at [facility], [place] [address], [place], [place] [address]    Report Status 02/19/2019 FINAL  Final   Organism ID, Bacteria METHICILLIN RESISTANT STAPHYLOCOCCUS AUREUS  Final       Susceptibility   Methicillin resistant staphylococcus aureus - MIC*    CIPROFLOXACIN >=8 RESISTANT Resistant     ERYTHROMYCIN >=8 RESISTANT Resistant     GENTAMICIN <=0.5 SENSITIVE Sensitive     OXACILLIN >=4 RESISTANT Resistant     TETRACYCLINE <=1 SENSITIVE Sensitive     VANCOMYCIN 1 SENSITIVE Sensitive     TRIMETH/SULFA <=10 SENSITIVE Sensitive     CLINDAMYCIN RESISTANT Resistant     RIFAMPIN <=0.5 SENSITIVE Sensitive     Inducible Clindamycin POSITIVE Resistant     * ABUNDANT METHICILLIN RESISTANT STAPHYLOCOCCUS AUREUS  CULTURE, BLOOD (ROUTINE X 2) w Reflex to ID Panel     Status: None   Collection Time: 02/18/19 11:29 PM   Specimen: BLOOD  Result Value Ref Range Status   Specimen Description BLOOD LEFT ARM  Final   Special Requests   Final    BOTTLES DRAWN AEROBIC AND ANAEROBIC Blood Culture adequate volume   Culture   Final    NO GROWTH 5 DAYS Performed at [facility], [address] [place], [place] [address]    Report Status 02/23/2019 FINAL  Final  CULTURE, BLOOD (ROUTINE X 2) w Reflex to ID Panel     Status: None   Collection Time: 02/18/19 11:29 PM   Specimen: BLOOD  Result Value Ref Range Status   Specimen Description BLOOD LEFT ARM  Final   Special Requests   Final    BOTTLES DRAWN AEROBIC AND ANAEROBIC Blood Culture adequate volume   Culture   Final    NO GROWTH 5 DAYS Performed at [facility], [address], [place], [place] [address]    Report Status 02/23/2019 FINAL  Final  Aerobic/Anaerobic Culture (surgical/deep wound)     Status: None   Collection Time: 03/04/19 11:04 AM   Specimen: PATH Other; Tissue  Result Value Ref Range Status   Specimen Description   Final    ARTERY Performed at [facility], [address], [place], [place]  [address]    Special Requests   Final    RIGHT AV GRAFT AND STENTS CULTURE Performed at [facility], [address] [place], [place] [address]    Gram Stain   Final     RARE WBC PRESENT, PREDOMINANTLY PMN NO ORGANISMS SEEN    Culture   Final    No growth aerobically or anaerobically. Performed at [facility], [place] [address], [place],  [address]    Report Status 03/09/2019 FINAL  Final  Aerobic/Anaerobic Culture (surgical/deep wound)     Status: None   Collection Time: 03/04/19 11:29 AM   Specimen: PATH Other; Tissue  Result Value Ref Range Status   Specimen Description  Final    ARTERY Performed at [facility], [address] [place], [place] [address]    Special Requests   Final    FLUID AROUND AV GRAFT Performed at [facility], [address] [place], [place] [address]    Gram Stain   Final    FEW WBC PRESENT, PREDOMINANTLY PMN NO ORGANISMS SEEN    Culture   Final    No growth aerobically or anaerobically. Performed at [facility], [place] [address] [place], [place] [address]    Report Status 03/09/2019 FINAL  Final  Wet prep, genital     Status: Abnormal   Collection Time: 03/16/19 10:01 AM  Result Value Ref Range Status   Yeast Wet Prep HPF POC PRESENT (A) NONE SEEN Final   Trich, Wet Prep NONE SEEN NONE SEEN Final   Clue Cells Wet Prep HPF POC NONE SEEN NONE SEEN Final   WBC, Wet Prep HPF POC MANY (A) NONE SEEN Final   Sperm NONE SEEN  Final    Comment: Performed at [facility], [address], [place], [place] [address]  Aerobic Culture (superficial specimen)     Status: None   Collection Time: 03/17/19  5:13 PM   Specimen: Vaginal Fluid  Result Value Ref Range Status   Specimen Description   Final    VAGINA Performed at [facility], [address], [place], [place] [address]    Special Requests   Final    NONE Performed at [facility], [address] [place], [place] [address]    Gram Stain   Final    MODERATE WBC PRESENT,BOTH PMN AND MONONUCLEAR FEW GRAM POSITIVE COCCI IN PAIRS FEW GRAM NEGATIVE RODS Performed at [facility], [place] [address] [place], [place] [address]    Culture   Final    ABUNDANT ESCHERICHIA COLI MODERATE KLEBSIELLA PNEUMONIAE    Report Status 03/21/2019 FINAL  Final   Organism ID, Bacteria ESCHERICHIA COLI  Final   Organism ID, Bacteria KLEBSIELLA PNEUMONIAE  Final      Susceptibility   Escherichia coli - MIC*    AMPICILLIN 4 SENSITIVE Sensitive     CEFAZOLIN <=4 SENSITIVE Sensitive     CEFEPIME <=0.12 SENSITIVE Sensitive     CEFTAZIDIME <=1 SENSITIVE Sensitive     CEFTRIAXONE <=0.25 SENSITIVE Sensitive     CIPROFLOXACIN <=0.25 SENSITIVE Sensitive     GENTAMICIN <=1 SENSITIVE Sensitive     IMIPENEM <=0.25 SENSITIVE Sensitive     TRIMETH/SULFA <=20 SENSITIVE Sensitive     AMPICILLIN/SULBACTAM <=2 SENSITIVE Sensitive     PIP/TAZO <=4 SENSITIVE Sensitive     * ABUNDANT ESCHERICHIA COLI   Klebsiella pneumoniae - MIC*    AMPICILLIN >=32 RESISTANT Resistant     CEFAZOLIN <=4 SENSITIVE Sensitive     CEFEPIME <=0.12 SENSITIVE Sensitive     CEFTAZIDIME <=1 SENSITIVE Sensitive     CEFTRIAXONE <=0.25 SENSITIVE Sensitive     CIPROFLOXACIN <=0.25 SENSITIVE Sensitive     GENTAMICIN <=1 SENSITIVE Sensitive     IMIPENEM <=0.25 SENSITIVE Sensitive     TRIMETH/SULFA <=20 SENSITIVE Sensitive     AMPICILLIN/SULBACTAM 4 SENSITIVE Sensitive     PIP/TAZO <=4 SENSITIVE Sensitive     * MODERATE KLEBSIELLA PNEUMONIAE  Anaerobic culture     Status: None   Collection Time: 03/20/19 12:40 PM   Specimen: PATH Cytology CSF; Cerebrospinal Fluid  Result Value Ref Range Status   Specimen Description   Final  CSF Performed at [facility], [address] [place], [place] [address]    Special Requests   Final    NONE Performed at [facility], [address] [place], [place] [address]    Gram Stain   Final    WBC PRESENT, PREDOMINANTLY MONONUCLEAR NO ORGANISMS SEEN CYTOSPIN SMEAR    Culture   Final    NO ANAEROBES ISOLATED Performed at [facility], [place] [address]  [place], [place] [address]    Report Status 03/27/2019 FINAL  Final  CSF culture     Status: None   Collection Time: 03/20/19 12:40 PM   Specimen: PATH Cytology CSF; Cerebrospinal Fluid  Result Value Ref Range Status   Specimen Description   Final    CSF Performed at [facility], [address], [place], [place] [address]    Special Requests   Final    NONE Performed at [facility], [address] [place], [place] [address]    Gram Stain   Final    WBC PRESENT, PREDOMINANTLY MONONUCLEAR NO ORGANISMS SEEN CYTOSPIN SMEAR    Culture   Final    NO GROWTH 3 DAYS Performed at [facility], [place] [address] [place], [place] [address]    Report Status 03/24/2019 FINAL  Final  Fungus Culture With Stain     Status: None (Preliminary result)   Collection Time: 03/20/19 12:40 PM   Specimen: PATH Cytology CSF; Cerebrospinal Fluid  Result Value Ref Range Status   Fungus Stain Final report  Final    Comment: (NOTE) Performed At: [facility] [address] [place], [place] [id number] Rush Farmer MD [id number]    Fungus (Mycology) Culture PENDING  Incomplete   Fungal Source CSF  Final    Comment: Performed at [facility], [address] [place], [place] [address]  Fungus Culture Result     Status: None   Collection Time: 03/20/19 12:40 PM  Result Value Ref Range Status   Result 1 Comment  Final    Comment: (NOTE) KOH/Calcofluor preparation:  no fungus observed. Performed At: [facility] [place], [place] [id number] Rush Farmer MD [id number]   MRSA PCR Screening     Status: None   Collection Time: 04/03/19  8:15 PM   Specimen: Nasopharyngeal  Result Value Ref Range Status   MRSA by PCR NEGATIVE NEGATIVE Final    Comment:        The GeneXpert MRSA Assay (FDA approved for NASAL specimens only), is one component of a comprehensive MRSA colonization surveillance program. It is not intended to diagnose  MRSA infection nor to guide or monitor treatment for MRSA infections. Performed at [facility], [address] [place], [place] [address]     Coagulation Studies: No results for input(s): LABPROT, INR in the last 72 hours.  Urinalysis: No results for input(s): COLORURINE, LABSPEC, PHURINE, GLUCOSEU, HGBUR, BILIRUBINUR, KETONESUR, PROTEINUR, UROBILINOGEN, NITRITE, LEUKOCYTESUR in the last 72 hours.  Invalid input(s): APPERANCEUR    Imaging: DG Abd 1 View  Result Date: 04/09/2019 CLINICAL DATA:  Abdominal pain. EXAM: ABDOMEN - 1 VIEW COMPARISON:  Abdomen 04/05/2019. Chest x-ray 03/04/2019. CT 10/23/2015. FINDINGS: Prior CABG. Surgical clips right upper quadrant. Tubing noted over the abdomen. Soft tissues the abdomen are unremarkable. Stool noted throughout the colon. No bowel distention or free air. Prominent skin folds again noted. Stable vascular calcification. Bibasilar subsegmental atelectasis and or scarring. Stable left pleural thickening noted most likely scarring.  Degenerative change thoracolumbar spine and both hips. Diffuse osteopenia. No acute bony abnormality identified. IMPRESSION: No acute abnormality identified. Electronically Signed   By: Marcello Moores  Register   On: 04/09/2019 08:48     Medications:   . sodium chloride 20 mL (03/23/19 1852)  . dextrose 5 % and 0.45% NaCl 50 mL/hr at 04/09/19 0751  . feeding supplement (OSMOLITE 1.5 CAL) 1,000 mL (04/08/19 2045)   . acidophilus  2 capsule Oral BID  . alteplase  2 mg Intracatheter Once  . alteplase  2 mg Intracatheter Once  . apixaban  5 mg Oral BID  . aspirin  81 mg Per Tube Daily  . B-complex with vitamin C  1 tablet Per Tube Daily  . Chlorhexidine Gluconate Cloth  6 each Topical Q0600  . epoetin (EPOGEN/PROCRIT) injection  10,000 Units Intravenous Q M,W,F-HD  . feeding supplement (PRO-STAT SUGAR FREE 64)  30 mL Per Tube Daily  . [START ON 04/17/2019] ferrous gluconate  324 mg Oral Daily  . fluticasone  2  spray Each Nare Daily  . folic acid  1 mg Per Tube Daily  . free water  20 mL Per Tube Q4H  . insulin aspart  0-15 Units Subcutaneous Q4H  . insulin glargine  10 Units Subcutaneous Daily  . levothyroxine  100 mcg Per Tube QAC breakfast  . mouth rinse  15 mL Mouth Rinse BID  . metoCLOPramide (REGLAN) injection  10 mg Intravenous Q6H  . midodrine  10 mg Oral Q M,W,F-HD  . pantoprazole sodium  40 mg Per Tube BID   sodium chloride, acetaminophen, ipratropium-albuterol, lip balm, naphazoline-glycerin, ondansetron (ZOFRAN) IV, promethazine, promethazine, sodium chloride, traMADol  Assessment/ Plan:  Jamie Arias is a 64 y.o. white female 84 year oldwith end-stage renal disease, atrial fibrillation, chronic diastolic congestive heart failure, CAD status post CABG, type 2 diabetes, hypertension, hyperlipidemia, hypothyroidism, Covid positive in July 2020, recent nursing home stay in [place],Prolonged hospitalization for MRSA bacteremia/sepsis and now with acute encephalopathy  [facility] MWF [facility]. LIJ permcath 83.5kg  1. End Stage Renal Disease on hemodialysis.   - MWF schedule - Consider albumin with HD treatments.   2. Anemia of chronic kidney disease: status post PRBC transfusion on 2/1 - EPO with HD treatment.   3. Hypotension:   - midodrine with HD treatment.   4. Secondary Hyperparathyroidism: not currently on binders. PTH 139, on 02/06/2019.   5. Encephalopathy: Continues to have improvement.     LOS: [place] 2/4/20211:26 PM

## 2019-04-09 NOTE — Progress Notes (Signed)
Patient's blood sugar decreased to 79. Gave 40mL of D50 IV. Will recheck in 15 minutes.

## 2019-04-09 NOTE — Progress Notes (Signed)
Rechecked patient's blood sugar and it increased to 127. Increased tube feedings to 20 mL/hr.  Will continue to monitor.  Jamie Arias

## 2019-04-09 NOTE — Progress Notes (Signed)
[place] NAME: Jerlean Konicki    MR#:  [id number]  DATE OF BIRTH:  August 09, 1955  SUBJECTIVE:  No overnight issues, seen and examined at bedside.  Patient is AO x1-2, confused and unable to offer any complaints.  Patient seems little bit more alert today As Per RN patient is unable to tolerate PEG tube feeding and is still nauseous    REVIEW OF SYSTEMS:   Review of Systems  Unable to perform ROS: Mental acuity   Tolerating Diet: PEG feeding Tolerating PT: no--bed bound  DRUG ALLERGIES:   Allergies  Allergen Reactions  . Nsaids Other (See Comments)    Contraindicated due to kidney disease.  Marland Kitchen Doxycycline Other (See Comments)    tremor    VITALS:  Blood pressure (!) 147/58, pulse 73, temperature 97.8 F (36.6 C), temperature source Oral, resp. rate 18, height 5' 4.02" (1.626 m), weight 84.6 kg, SpO2 100 %.  PHYSICAL EXAMINATION:   Physical Exam limited due to pt participation and MS changes  Constitutional: NAD, awake, more expression in her face today, sitting next to her husband in a chair, saying a few words HEENT: conjunctivae and lids normal, EOMI HD cath+ CV: RRR 2+ systolic murmur at upper sternal boarders. Distal pulses +2.  No cyanosis.   RESP: CTA B/L over anterior, normal respiratory effort, on RA GI: +BS, NTND, PEG tube in place Extremities: Pressure ulcers bilateral feet, right upper arm swelling, bandage intact  SKIN: warm, dry, extensive bruising  LABORATORY PANEL:  CBC Recent Labs  Lab 04/08/19 1201  WBC 5.1  HGB 8.7*  HCT 27.0*  PLT 243    Chemistries  Recent Labs  Lab 04/09/19 0742  NA 141  K 4.4  CL 96*  CO2 33*  GLUCOSE 90  BUN 19  CREATININE 1.98*  CALCIUM 9.1   Cardiac Enzymes No results for input(s): TROPONINI in the last 168 hours. RADIOLOGY:  DG Abd 1 View  Result Date: 04/09/2019 CLINICAL DATA:  Abdominal pain. EXAM: ABDOMEN - 1 VIEW COMPARISON:  Abdomen 04/05/2019.  Chest x-ray 03/04/2019. CT 10/23/2015. FINDINGS: Prior CABG. Surgical clips right upper quadrant. Tubing noted over the abdomen. Soft tissues the abdomen are unremarkable. Stool noted throughout the colon. No bowel distention or free air. Prominent skin folds again noted. Stable vascular calcification. Bibasilar subsegmental atelectasis and or scarring. Stable left pleural thickening noted most likely scarring. Degenerative change thoracolumbar spine and both hips. Diffuse osteopenia. No acute bony abnormality identified. IMPRESSION: No acute abnormality identified. Electronically Signed   By: Marcello Moores  Register   On: 04/09/2019 08:48   ASSESSMENT AND PLAN:  Ms.Orlandria F Kinneyis a 64 y.o.white femalewith end-stage renal disease on hemodialysis, atrial fibrillation, chronic diastolic congestive heart failure, coronary artery disease status post CABG, diabetes mellitus type II, hypertension, hyperlipidemia, hypothyroidism, Covid positive in July 2020, recent nursing home stay in [place]. Prolonged hospitalization for MRSA bacteremia/sepsis and now with acute encephalopathy.  Acute metabolic encephalopathy:  -mental status improving slowly with patient saying a few words every now and then. Cannot hold meaningful conversation -MRI of brain and EEG negative. Patient remained afebrile with no leukocytosis. -B12 within normal range - IR  performed LP on 03/20/19 AM: CSF shows only 8 WBC, Protein elevated at 73, Glucose 83. Negative for Toxo. CSF Cx final -- negative.  - ID, Neurology assistance   -Overall long-term prognosis seems poor.  -Palliative care consulted -SLP eval recommended continue PEG tube feeding and aspiration  precautions.  Reconsult once patient's mental status improves  Sepsis secondary to MRSA bacteremia.   -  Right arm graft was removed on 03/04/2019. -Completed vancomycin for 6-week course on 04/01/2019.   Anemia of chronic illness.   -Status post transfusion 1 unit of PRBC on  03/17/2019 -  hemoglobin stable at 6.9---transfused on 04/07/19--9.5 -  Iron studies consistent with anemia of chronic illness.     -B12 within normal range -Continue EPO with dialysis.  Hypotension, chronic  Hypoalbuminemia  -on midodrine M-W-F -blood pressure currently stable  ESRD on HD (MWF).    -Currently using tunneled catheter as graft was removed due to MRSA bacteremia. -Continue with scheduled dialysis per nephrology -patient has tolerated for our dialysis treatment sitting up.  Left arm DVT.   -Currently on Eliquis - recent diagnosis of left arm DVT on 03/03/2019.  Hypothyroidism.  Repeat TSH elevated at 17.82. -continue Increased dose of Synthroid 100. - repeat TSH in 2 to 3 weeks.  Paroxysmal A. Fib.. Currently in sinus. Amiodarone was discontinued. -Continue Eliquis  Type 2 diabetes. -Continue SSI. -Lantus 10 units daily  Chronic diastolic heart failure.  - Volume being managed by dialysis.  Bilateral decubitus ulcers.  Present on admission. -Continue with wound care. -Wound care nurse to follow sacral wound; consulted again on 03/24/19   Nutrition.  PEG placed 03/03/2019. Continue tube feeding.  SLP eval done, recommended n.p.o., continue PEG tube feeding and reconsult once mental status improves. PEG tube clogged GI consulted for PEG tube management  Acute hypoxic respiratory failure.  Resolved.  Patient was intubated initially, extubated on 03/05/2019. -On room air now  DVT prophylaxis: Eliquis Code Status: DNR Family Communication:  No one is available at bedside,   Ddisposition Plan: rehab [place] to d/c: pt apparently does not have medicare days left and husband cannot afford private pay. Challenging discharge disposition!  TOTAL TIME TAKING CARE OF THIS PATIENT: *20* minutes.  >50% time spent on counselling and coordination of care  Note: This dictation was prepared with Dragon dictation along with smaller phrase technology. Any  transcriptional errors that result from this process are unintentional.  Val Riles M.D    [facility]   CC: Primary care physician; [name], MDPatient ID: [name], female   DOB: 1955/09/22, 64 y.o.   MRN: [id number]

## 2019-04-09 NOTE — Progress Notes (Signed)
Hypoglycemic Event  CBG: 34  Treatment: 50 mL 50% Dextrose IVP  Symptoms: Asymptomatic   Follow-up CBG: Time: 1930 CBG  Result: 116  Possible Reasons for Event: NPO  Comments/MD notified:Yes. Additional orders to change D5 1/2 NS 50 ml/hr to D5 at 75 mL/hr    Fuller Mandril, RN

## 2019-04-09 NOTE — Progress Notes (Signed)
Patient's blood sugar decreased to 57. Gave 32mL of D50. Will recheck in 15 minutes.  Christene Slates.

## 2019-04-09 NOTE — Care Management Important Message (Signed)
Important Message  Patient Details  Name: AZANI STOCKARD MRN: [id number] Date of Birth: [birth year]   Medicare Important Message Given:  Yes     Dannette Barbara 04/09/2019, 1:10 PM

## 2019-04-09 NOTE — Evaluation (Addendum)
Clinical/Bedside Swallow Evaluation Patient Details  Name: Jamie Arias MRN: [id number] Date of Birth: 1956/02/17  Today's Date: 04/09/2019 Time: SLP Start Time (ACUTE ONLY): 0850 SLP Stop Time (ACUTE ONLY): 0925 SLP Time Calculation (min) (ACUTE ONLY): 35 min  Past Medical History:  Past Medical History:  Diagnosis Date  . Anemia   . Chronic diastolic CHF (congestive heart failure) ([place])    a. Due to ischemic cardiomyopathy. EF as low as 35%, improved to normal s/p CABG; b. echo 07/06/13: EF 55-60%, no RWMAs, mod TR, trivial pericardial effusion not c/w tamponade physiology;  c. 10/2015 Echo: EF 65%, Gr1 DD, triv AI, mild MR, mildly dil LA, mod TR, PASP 16mmHg.  Marland Kitchen Coronary artery disease    a. NSTEMI 06/2013; b.cath: severe three-vessel CAD w/ EF 30% & mild-mod MR; c. s/p 3 vessel CABG 07/02/13 (LIMA-LAD, SVG-OM, and SVG-RPDA);  d. 10/2015 MV: no ischemia/infarct.  . Diabetes mellitus without complication ([place])   . Diabetic neuropathy ([place])   . Dialysis patient [facility])    MWF  . ESRD (end stage renal disease) ([place])    a. 12/2015 initiated - mwf dialysis.  Marland Kitchen GERD (gastroesophageal reflux disease)   . Hyperlipidemia   . Hypertension   . Hypothyroidism   . Myocardial infarction ([place]) 2015  . Neuropathy   . Pleural effusion 2015  . Pulmonary hypertension ([place])   . Renal insufficiency   . Wears dentures    full lower   Past Surgical History:  Past Surgical History:  Procedure Laterality Date  . A/V FISTULAGRAM Right 12/17/2016   Procedure: A/V Fistulagram;  Surgeon: Algernon [place], MD;  Location: [place] CV LAB;  Service: Cardiovascular;  Laterality: Right;  . A/V FISTULAGRAM Right 01/07/2017   Procedure: A/V Fistulagram;  Surgeon: Algernon [place], MD;  Location: [place] CV LAB;  Service: Cardiovascular;  Laterality: Right;  . A/V FISTULAGRAM Right 12/03/2017   Procedure: A/V FISTULAGRAM;  Surgeon: Katha Cabal, MD;  Location: [place] CV LAB;  Service: Cardiovascular;   Laterality: Right;  . A/V FISTULAGRAM Right 02/23/2019   Procedure: A/V Fistulagram;  Surgeon: Algernon [place], MD;  Location: [place] CV LAB;  Service: Cardiovascular;  Laterality: Right;  . A/V SHUNT INTERVENTION N/A 02/22/2017   Procedure: A/V SHUNT INTERVENTION;  Surgeon: Algernon [place], MD;  Location: [place] CV LAB;  Service: Cardiovascular;  Laterality: N/A;  . AV FISTULA PLACEMENT Right 02/03/2016   Procedure: INSERTION OF ARTERIOVENOUS (AV) GORE-TEX GRAFT ARM ( BRACH / AXILLARY );  Surgeon: Katha Cabal, MD;  Location: [facility] OR[facility];  Service: Vascular;  Laterality: Right;  . [place] REMOVAL Right 03/04/2019   Procedure: REMOVAL OF ARTERIOVENOUS GORETEX GRAFT ([place]);  Surgeon: Algernon [place], MD;  Location: [facility] OR[facility];  Service: Vascular;  Laterality: Right;  . CARDIAC CATHETERIZATION    . CATARACT EXTRACTION Bilateral   . CHOLECYSTECTOMY N/A 12/09/2014   Procedure: LAPAROSCOPIC CHOLECYSTECTOMY;  Surgeon: Marlyce [name], MD;  Location: [facility] OR[facility];  Service: General;  Laterality: N/A;  . CORONARY ARTERY BYPASS GRAFT N/A 07/02/2013   Procedure: CORONARY ARTERY BYPASS GRAFTING (CABG);  Surgeon: Ivin [name], MD;  Location: [place];  Service: Open Heart Surgery;  Laterality: N/A;  CABG x three, using left internal mammary artery and right leg greater saphenous vein harvested endoscopically  . ESOPHAGOGASTRODUODENOSCOPY (EGD) WITH PROPOFOL N/A 11/24/2015   Procedure: ESOPHAGOGASTRODUODENOSCOPY (EGD) WITH PROPOFOL;  Surgeon: [name], MD;  Location: [place];  Service: Endoscopy;  Laterality: N/A;  Diabetic -  insulin  . EYE SURGERY Bilateral    Cataract Extraction with IOL  . INTRAOPERATIVE TRANSESOPHAGEAL ECHOCARDIOGRAM N/A 07/02/2013   Procedure: INTRAOPERATIVE TRANSESOPHAGEAL ECHOCARDIOGRAM;  Surgeon: Ivin [name], MD;  Location: [place];  Service: Open Heart Surgery;  Laterality: N/A;  . PEG PLACEMENT N/A 03/03/2019   Procedure: PERCUTANEOUS ENDOSCOPIC GASTROSTOMY (PEG)  PLACEMENT;  Surgeon: Virgel Manifold, MD;  Location: [facility];  Service: Endoscopy;  Laterality: N/A;  . PERIPHERAL VASCULAR CATHETERIZATION Right 12/06/2015   Procedure: Dialysis/Perma Catheter Insertion;  Surgeon: Katha Cabal, MD;  Location: [place] [facility];  Service: Cardiovascular;  Laterality: Right;  . PERIPHERAL VASCULAR THROMBECTOMY Right 09/28/2016   Procedure: Peripheral Vascular Thrombectomy;  Surgeon: Katha Cabal, MD;  Location: [place] CV LAB;  Service: Cardiovascular;  Laterality: Right;  . PERIPHERAL VASCULAR THROMBECTOMY Right 05/20/2018   Procedure: PERIPHERAL VASCULAR THROMBECTOMY;  Surgeon: Katha Cabal, MD;  Location: [place] [facility] LAB;  Service: Cardiovascular;  Laterality: Right;  . PORTA CATH REMOVAL N/A 06/01/2016   Procedure: Glori [name] Cath Removal;  Surgeon: Katha Cabal, MD;  Location: [place] [facility];  Service: Cardiovascular;  Laterality: N/A;  . THORACENTESIS Left 2015   HPI:  Per admitting H&P: History limited as patient was unable to provide any history.  History obtained from ER charts staffs and patient's husband over the phone. Jamie Arias is a 64 y.o. female with medical history significant ofESRD on HDMWF,anemia, chronic diastolic congestive heart failure, CAD status post PCI, type 2 diabetes, hypertension, hyperlipidemia, hypothyroidism, and conditions listed belowpresenting via EMS after she was found to be somewhat less responsive since last night and this morning.  According to husband patient was having stomach upset all day yesterday.  And patient has had excessive sweating overnight.  Patient was this morning noticed to be less responsive per her husband and therefore he called EMS to send her to the hospital.  And has been weak on lower extremity since her last discharge from in July from Covid pneumonia.  Has been patient did not have any shortness of breath, cough, seizure-like activity.Subjective fever as  patient was sweating overnight.  EMS was called and who brought the patient's here.  Pt was admitted in in June for COVID-19 pneumonia and had a prolonged hospitalization before discharge in late July.  Patient was receiving home health care.  However as per record patient appears to have failed to follow-up with her PCP.  The ED patient denies any fevers, chills, chest pain, shortness of breath, cough, nausea, vomiting, abdominal pain, diarrhea, dysuria, or urinary frequency/urgency. No additional history could be obtained from her. ED course: Patient was found to be hypotensive and somewhat tachycardic.  Patient was given fluid bolus and prophylactic antibiotic.  Appointment was found to be mildly elevated.  CT scan of the head without contrast was found to be negative for any acute finding.  Assessment / Plan / Recommendation Clinical Impression  Pt was seen today for re-evaluation due to increased alertness to determine if pt is safe to begin PO diet. Since last SLP re-evaluation on 02/28/2019 when pt was unable to remain alert and unsafe for any PO intake, pt subsequently received a PEG tube on 03/03/2019. Pt is now notably more alert with eyes wide open, eyes appear extra wide open at rest; however, pt makes inconsistent attempts to answer questions and does not independently attempt to attend to SLP. She inconsistently follows simple commands and appears to stare into space largely throughout evaluation,  intermittently answering SLP questions appropriately.   Given limited PO trials of 1/2 tsp puree x4 and single ice chips x2, pt presents w/positive s/s oropharyngeal dysphagia. Pt refused further trials. Oral phase c/b decreased coordination, and increased A-P transit time w/both consistencies. Oral mech exam revealed mod generalized weakness of all structures. Pharyngeal phase c/b multiple swallows w/puree.  No apparent immediate overt s/s aspiration observed with puree or ice chips; however pt is  at high risk of aspiration largely due to decreased cognitive status and lack of self awareness and is high risk for inadequate nutrition if attempted to rely on PO's for nutrition alone. Pt does not demonstrate appropriate awareness/orientation for safe swallowing and remains inappropriate for a PO diet due to decreased cognition. Pt may have ice chips given by nursing for pleasure. Nsg to d/c trials ice chips with any overt s/s aspiration. Rec continue w/ PEG feedings as source of nutrition and meds via alternative means. Recommend FREQUENT oral care. No SLP tx is indicated at this time due; please re-consult in future if pt's cognitive status markedly improves to re-assess safety of swallow.  SLP Visit Diagnosis: Dysphagia, oropharyngeal phase (R13.12)    Aspiration Risk  Moderate aspiration risk;Risk for inadequate nutrition/hydration    Diet Recommendation NPO;Ice chips PRN after oral care   Medication Administration: Via alternative means Supervision: Comment Compensations: Minimize environmental distractions;Slow rate;Small sips/bites Postural Changes: Seated upright at 90 degrees;Remain upright for at least 30 minutes after po intake    Other  Recommendations Oral Care Recommendations: Staff/trained caregiver to provide oral care Other Recommendations: Prohibited food (jello, ice cream, thin soups);Remove water pitcher   Follow up Recommendations Skilled Nursing facility      Frequency and Duration Other (Comment)(Not appropriate for tx at this time)          Prognosis Prognosis for Safe Diet Advancement: Guarded Barriers to Reach Goals: Cognitive deficits;Severity of deficits      Swallow Study   General Date of Onset: 02/09/19 HPI: Per admitting H&P: History limited as patient was unable to provide any history.  History obtained from ER charts staffs and patient's husband over the phone. Type of Study: Bedside Swallow Evaluation Diet Prior to this Study: NPO;PEG  tube Temperature Spikes Noted: No Respiratory Status: Room air History of Recent Intubation: No Behavior/Cognition: Alert;Cooperative;Distractible;Requires cueing;Doesn't follow directions;Other (Comment)(follows directions inconsistently) Oral Cavity Assessment: Dry Oral Cavity - Dentition: Missing dentition Self-Feeding Abilities: Total assist Patient Positioning: Upright in bed Baseline Vocal Quality: Hoarse;Low vocal intensity Volitional Cough: Weak Volitional Swallow: Able to elicit    Oral/Motor/Sensory Function Overall Oral Motor/Sensory Function: Generalized oral weakness Facial Symmetry: Within Functional Limits   Ice Chips Ice chips: Impaired Presentation: Spoon Oral Phase Impairments: Reduced lingual movement/coordination;Other (comment) Oral Phase Functional Implications: Prolonged oral transit   Thin Liquid Thin Liquid: Not tested    Nectar Thick Nectar Thick Liquid: Not tested   Honey Thick Honey Thick Liquid: Not tested   Puree Puree: Impaired Presentation: Spoon Oral Phase Impairments: Reduced lingual movement/coordination Oral Phase Functional Implications: Prolonged oral transit Pharyngeal Phase Impairments: Multiple swallows   Solid     Solid: Not tested      Jessa Stinson, MA, CCC-SLP 04/09/2019,12:41 PM

## 2019-04-09 NOTE — Progress Notes (Signed)
Rechecked blood sugar and it increased to 117. Increased tube feeding to 56mL/hr. Patient has not complained of abdominal pain or nausea all night.  Will continue to monitor.  Christene Slates

## 2019-04-10 LAB — RENAL FUNCTION PANEL
Albumin: 1.9 g/dL — ABNORMAL LOW (ref 3.5–5.0)
Anion gap: 8 (ref 5–15)
BUN: 19 mg/dL (ref 8–23)
CO2: 33 mmol/L — ABNORMAL HIGH (ref 22–32)
Calcium: 8.1 mg/dL — ABNORMAL LOW (ref 8.9–10.3)
Chloride: 97 mmol/L — ABNORMAL LOW (ref 98–111)
Creatinine, Ser: 2.11 mg/dL — ABNORMAL HIGH (ref 0.44–1.00)
GFR calc Af Amer: 28 mL/min — ABNORMAL LOW (ref >60)
GFR calc non Af Amer: 24 mL/min — ABNORMAL LOW (ref >60)
Glucose, Bld: 98 mg/dL (ref 70–99)
Phosphorus: 3.5 mg/dL (ref 2.5–4.6)
Potassium: 3.6 mmol/L (ref 3.5–5.1)
Sodium: 138 mmol/L (ref 135–145)

## 2019-04-10 LAB — GLUCOSE, CAPILLARY
Glucose-Capillary: 127 mg/dL — ABNORMAL HIGH (ref 70–99)
Glucose-Capillary: 44 mg/dL — CL (ref 70–99)
Glucose-Capillary: 61 mg/dL — ABNORMAL LOW (ref 70–99)
Glucose-Capillary: 72 mg/dL (ref 70–99)
Glucose-Capillary: 75 mg/dL (ref 70–99)
Glucose-Capillary: 80 mg/dL (ref 70–99)
Glucose-Capillary: 81 mg/dL (ref 70–99)
Glucose-Capillary: 82 mg/dL (ref 70–99)

## 2019-04-10 LAB — CBC
HCT: 25.6 % — ABNORMAL LOW (ref 36.0–46.0)
Hemoglobin: 8.2 g/dL — ABNORMAL LOW (ref 12.0–15.0)
MCH: 31.4 pg (ref 26.0–34.0)
MCHC: 32 g/dL (ref 30.0–36.0)
MCV: 98.1 fL (ref 80.0–100.0)
Platelets: 207 10*3/uL (ref 150–400)
RBC: 2.61 MIL/uL — ABNORMAL LOW (ref 3.87–5.11)
RDW: 17.2 % — ABNORMAL HIGH (ref 11.5–15.5)
WBC: 4.9 10*3/uL (ref 4.0–10.5)
nRBC: 0 % (ref 0.0–0.2)

## 2019-04-10 MED ORDER — ALBUMIN HUMAN 25 % IV SOLN
25.0000 g | Freq: Once | INTRAVENOUS | Status: AC
  Administered 2019-04-10: 25 g via INTRAVENOUS
  Filled 2019-04-10 (×2): qty 100

## 2019-04-10 MED ORDER — PANTOPRAZOLE SODIUM 40 MG IV SOLR
40.0000 mg | Freq: Two times a day (BID) | INTRAVENOUS | Status: DC
  Administered 2019-04-10 – 2019-04-16 (×13): 40 mg via INTRAVENOUS
  Filled 2019-04-10 (×13): qty 40

## 2019-04-10 MED ORDER — DEXTROSE 50 % IV SOLN
25.0000 g | INTRAVENOUS | Status: AC
  Administered 2019-04-10: 25 g via INTRAVENOUS

## 2019-04-10 MED ORDER — DEXTROSE 50 % IV SOLN
12.5000 g | Freq: Once | INTRAVENOUS | Status: AC
  Administered 2019-04-10: 12.5 g via INTRAVENOUS

## 2019-04-10 MED ORDER — MORPHINE SULFATE (PF) 2 MG/ML IV SOLN
2.0000 mg | INTRAVENOUS | Status: DC | PRN
  Administered 2019-04-10 – 2019-04-20 (×6): 2 mg via INTRAVENOUS
  Filled 2019-04-10 (×7): qty 1

## 2019-04-10 MED ORDER — FOLIC ACID 5 MG/ML IJ SOLN
1.0000 mg | Freq: Every day | INTRAMUSCULAR | Status: DC
  Administered 2019-04-10 – 2019-04-18 (×8): 1 mg via INTRAVENOUS
  Filled 2019-04-10 (×9): qty 0.2

## 2019-04-10 MED ORDER — LEVOTHYROXINE SODIUM 100 MCG/5ML IV SOLN
50.0000 ug | Freq: Every day | INTRAVENOUS | Status: DC
  Administered 2019-04-10 – 2019-04-11 (×2): 50 ug via INTRAVENOUS
  Filled 2019-04-10 (×2): qty 5

## 2019-04-10 MED ORDER — DEXTROSE-NACL 5-0.9 % IV SOLN: INTRAVENOUS | Status: DC

## 2019-04-10 MED ORDER — DEXTROSE 10 % IV SOLN: INTRAVENOUS | Status: DC

## 2019-04-10 MED ORDER — DEXTROSE 50 % IV SOLN
INTRAVENOUS | Status: AC
  Filled 2019-04-10: qty 50

## 2019-04-10 NOTE — Progress Notes (Signed)
Patient's blood sugar was 44. Given 43mL of D50 IV.  Rechecked 15 minutes later and it increased to 127. Will continue to monitor. Patient would benefit from TPN until peg tube is replaced.  Christene Slates

## 2019-04-10 NOTE — Progress Notes (Signed)
Jamie Darby, MD [address]  [place]  [place], [place] [address]  Main: [phone]  Fax: [phone] Pager: [phone]   Subjective: Patient underwent dialysis today.  Patient is coherent, denies nausea, abdominal pain or vomiting Husband is bedside, no acute events overnight   Objective: Vital signs in last 24 hours: Vitals:   04/10/19 1315 04/10/19 1330 04/10/19 1400 04/10/19 1430  BP: (!) 147/63 (!) 138/55  136/61  Pulse: 70 73  72  Resp: _0 Temp:   98.4 F (36.9 C)   TempSrc:   Oral   SpO2:      Weight:      Height:       Weight change:   Intake/Output Summary (Last 24 hours) at 04/10/2019 1841 Last data filed at 04/10/2019 1400 Gross per 24 hour  Intake --  Output 2000 ml  Net -2000 ml     Exam: Heart:: Regular rate and rhythm, S1S2 present or without murmur or extra heart sounds Lungs: normal and clear to auscultation Abdomen: soft, nontender, normal bowel sounds   Lab Results: _1 @ Micro Results: Recent Results (from the past 240 hour(s))  MRSA PCR Screening     Status: None   Collection Time: 04/03/19  8:15 PM   Specimen: Nasopharyngeal  Result Value Ref Range Status   MRSA by PCR NEGATIVE NEGATIVE Final    Comment:        The GeneXpert MRSA Assay (FDA approved for NASAL specimens only), is one component of a comprehensive MRSA colonization surveillance program. It is not intended to diagnose MRSA infection nor to guide or monitor treatment for MRSA infections. Performed at [facility], [address] [place], [place] [address]    Studies/Results: [place] Abd 1 View  Result Date: 04/09/2019 CLINICAL DATA:  Abdominal pain. EXAM: ABDOMEN - 1 VIEW COMPARISON:  Abdomen 04/05/2019. Chest x-ray 03/04/2019. CT 10/23/2015. FINDINGS: Prior CABG. Surgical clips right upper quadrant. Tubing noted over the abdomen. Soft tissues the abdomen are unremarkable. Stool noted throughout the colon. No bowel distention or  free air. Prominent skin folds again noted. Stable vascular calcification. Bibasilar subsegmental atelectasis and or scarring. Stable left pleural thickening noted most likely scarring. Degenerative change thoracolumbar spine and both hips. Diffuse osteopenia. No acute bony abnormality identified. IMPRESSION: No acute abnormality identified. Electronically Signed   By: Marcello Moores  Register   On: 04/09/2019 08:48   Medications:  I have reviewed the patient's current medications. Prior to Admission:  Medications Prior to Admission  Medication Sig Dispense Refill Last Dose  . ACCU-CHEK FASTCLIX LANCETS MISC Check blood glucose twice daily, diagnosis code E11.9 204 each 3   . acetaminophen (TYLENOL) 325 MG tablet Take 650 mg by mouth daily as needed for moderate pain or headache.    Unknown at PRN  . Alirocumab (PRALUENT) 75 MG/ML SOAJ Inject 75 mg into the skin every 14 (fourteen) days. 2 pen 11 As directed at As directed  . Amino Acids-Protein Hydrolys (FEEDING SUPPLEMENT, PRO-STAT SUGAR FREE 64,) LIQD Take 30 mLs by mouth 3 (three) times daily between meals. 887 mL 0   . amiodarone (PACERONE) 200 MG tablet Take 200 mg by mouth daily.   24+ hours at Unknown  . aspirin EC 81 MG tablet Take 81 mg by mouth daily.   24+ hours at Unknown  . atorvastatin (LIPITOR) 40 MG tablet Take 40 mg by mouth daily.   24+ hours at Unknown  . Blood Glucose Monitoring Suppl (ACCU-CHEK  AVIVA PLUS) w/Device KIT Use as directed to test blood sugar up to three times daily 1 kit 0   . Calcium Acetate 667 MG TABS Take 2,001 mg by mouth 3 (three) times daily with meals.   24+ hours at Unknown  . ELIQUIS 5 MG TABS tablet Take 5 mg by mouth 2 (two) times daily.    24+ hours at Unknown  . ferrous sulfate 325 (65 FE) MG tablet Take 325 mg by mouth daily with breakfast.   24+ hours at Unknown  . FLUoxetine (PROZAC) 20 MG capsule Take 20 mg by mouth daily.   24+ hours at Unknown  . fluticasone (FLONASE) 50 MCG/ACT nasal spray Place 2  sprays into both nostrils daily. (Patient taking differently: Place 2 sprays into both nostrils daily as needed for allergies or rhinitis. ) 16 g 6 Unknown at PRN  . furosemide (LASIX) 20 MG tablet Take 40 mg by mouth every other day. Non-dialysis days    As directed at As directed  . gabapentin (NEURONTIN) 300 MG capsule Take 300 mg by mouth 2 (two) times daily.    24+ hours at Unknown  . glucose blood (ACCU-CHEK AVIVA) test strip Use as instructed to test blood sugar up to 3 times daily 100 each 12   . Insulin Glargine (BASAGLAR KWIKPEN) 100 UNIT/ML SOPN Inject 18 Units into the skin daily.   24+ hours at Unknown  . Lancets (ACCU-CHEK SOFT TOUCH) lancets Use as instructed 100 each 12   . levothyroxine (SYNTHROID) 88 MCG tablet Take 88 mcg by mouth daily before breakfast.   24+ hours at Unknown  . lidocaine-prilocaine (EMLA) cream Apply 1 application topically as needed (port access).   Unknown at PRN  . midodrine (PROAMATINE) 10 MG tablet Take 1 tablet (10 mg total) by mouth 3 (three) times daily with meals. (Patient taking differently: Take 10 mg by mouth 3 (three) times daily as needed (low BP). )   Unknown at PRN  . multivitamin (RENA-VIT) TABS tablet Take 1 tablet by mouth at bedtime.  0 24+ hours at Unknown  . nitroGLYCERIN (NITROSTAT) 0.4 MG SL tablet DISSOLVE ONE TABLET UNDER THE TONGUE EVERY 5 MINUTES AS NEEDED FOR CHEST PAIN.  DO NOT EXCEED A TOTAL OF 3 DOSES IN 15 MINUTES (Patient taking differently: Place 0.4 mg under the tongue every 5 (five) minutes as needed for chest pain. DISSOLVE ONE TABLET UNDER THE TONGUE EVERY 5 MINUTES AS NEEDED FOR CHEST PAIN.  DO NOT EXCEED A TOTAL OF 3 DOSES IN 15 MINUTES) 25 tablet 0 Unknown at PRN  . ondansetron (ZOFRAN-ODT) 4 MG disintegrating tablet Take 1 tablet (4 mg total) by mouth every 8 (eight) hours as needed for nausea or vomiting. appt with PCP further refills Dr. Caryl Bis 30 tablet 0 Unknown at PRN  . pantoprazole (PROTONIX) 40 MG tablet Take 40  mg by mouth daily.   24+ hours at Unknown  . vitamin C (VITAMIN C) 500 MG tablet Take 1 tablet (500 mg total) by mouth daily.     Marland Kitchen zinc sulfate 220 (50 Zn) MG capsule Take 1 capsule (220 mg total) by mouth daily.     . mirtazapine (REMERON) 15 MG tablet Take 1 tablet (15 mg total) by mouth at bedtime. (Patient not taking: Reported on 02/09/2019) 30 tablet 1 Not Taking at Unknown time   Scheduled: . acidophilus  2 capsule Oral BID  . alteplase  2 mg Intracatheter Once  . alteplase  2 mg Intracatheter Once  .  apixaban  5 mg Oral BID  . aspirin  81 mg Per Tube Daily  . B-complex with vitamin C  1 tablet Per Tube Daily  . Chlorhexidine Gluconate Cloth  6 each Topical Q0600  . epoetin (EPOGEN/PROCRIT) injection  10,000 Units Intravenous Q M,W,F-HD  . feeding supplement (PRO-STAT SUGAR FREE 64)  30 mL Per Tube Daily  . [START ON 04/17/2019] ferrous gluconate  324 mg Oral Daily  . fluticasone  2 spray Each Nare Daily  . folic acid  1 mg Intravenous Daily  . free water  20 mL Per Tube Q4H  . insulin aspart  0-15 Units Subcutaneous Q4H  . levothyroxine  50 mcg Intravenous Q0600  . mouth rinse  15 mL Mouth Rinse BID  . metoCLOPramide (REGLAN) injection  10 mg Intravenous Q6H  . midodrine  10 mg Oral Q M,W,F-HD  . pantoprazole (PROTONIX) IV  40 mg Intravenous Q12H   Continuous: . sodium chloride 20 mL (03/23/19 1852)  . dextrose 75 mL/hr at 04/10/19 0420  . dextrose 5 % and 0.9% NaCl 75 mL/hr at 04/10/19 1839  . feeding supplement (OSMOLITE 1.5 CAL) 1,000 mL (04/08/19 2045)   JKD:TOIZTI chloride, acetaminophen, ipratropium-albuterol, lip balm, naphazoline-glycerin, ondansetron (ZOFRAN) IV, promethazine, promethazine, sodium chloride, traMADol Anti-infectives (From admission, onward)   Start     Dose/Rate Route Frequency Ordered Stop   03/27/19 1800  vancomycin (VANCOREADY) IVPB 750 mg/150 mL     750 mg 150 mL/hr over 60 Minutes Intravenous  Once 03/27/19 1257 03/27/19 1743   03/22/19 0600   Ampicillin-Sulbactam (UNASYN) 3 g in sodium chloride 0.9 % 100 mL IVPB     3 g 200 mL/hr over 30 Minutes Intravenous Every 12 hours 03/21/19 1921 03/23/19 1923   03/21/19 1930  Ampicillin-Sulbactam (UNASYN) 3 g in sodium chloride 0.9 % 100 mL IVPB  Status:  Discontinued     3 g 200 mL/hr over 30 Minutes Intravenous Every 12 hours 03/21/19 1920 03/21/19 1921   03/21/19 1800  Ampicillin-Sulbactam (UNASYN) 3 g in sodium chloride 0.9 % 100 mL IVPB     3 g 200 mL/hr over 30 Minutes Intravenous Every 12 hours 03/21/19 0419 03/21/19 1927   03/20/19 2000  Ampicillin-Sulbactam (UNASYN) 3 g in sodium chloride 0.9 % 100 mL IVPB  Status:  Discontinued     3 g 200 mL/hr over 30 Minutes Intravenous Every 24 hours 03/20/19 1847 03/21/19 0419   03/05/19 1600  vancomycin (VANCOREADY) IVPB 500 mg/100 mL  Status:  Discontinued     500 mg 100 mL/hr over 60 Minutes Intravenous  Once 03/05/19 1505 03/05/19 1525   03/05/19 1600  vancomycin (VANCOCIN) 500 mg in sodium chloride 0.9 % 100 mL IVPB     500 mg 100 mL/hr over 60 Minutes Intravenous  Once 03/05/19 1525 03/05/19 1727   03/04/19 0047  ceFAZolin (ANCEF) IVPB 2g/100 mL premix     2 g 200 mL/hr over 30 Minutes Intravenous 30 min pre-op 03/04/19 0047 03/04/19 1036   03/03/19 1645  vancomycin (VANCOREADY) IVPB 1250 mg/250 mL  Status:  Discontinued     1,250 mg 166.7 mL/hr over 90 Minutes Intravenous  Once 03/03/19 1644 03/03/19 1720   02/27/19 1200  vancomycin (VANCOREADY) IVPB 750 mg/150 mL     750 mg 150 mL/hr over 60 Minutes Intravenous Every M-W-F (Hemodialysis) 02/26/19 0935 04/01/19 2359   02/26/19 1200  vancomycin (VANCOREADY) IVPB 1500 mg/300 mL     1,500 mg 150 mL/hr over 120 Minutes Intravenous  Once 02/26/19 0935 02/27/19 0956   02/18/19 2000  DAPTOmycin (CUBICIN) 650 mg in sodium chloride 0.9 % IVPB  Status:  Discontinued     650 mg 226 mL/hr over 30 Minutes Intravenous Every 48 hours 02/16/19 1026 03/02/19 1539   02/16/19 1200  DAPTOmycin  (CUBICIN) 650 mg in sodium chloride 0.9 % IVPB     650 mg 226 mL/hr over 30 Minutes Intravenous  Once 02/16/19 1019 02/16/19 1232   02/16/19 1200  ceftaroline (TEFLARO) 300 mg in sodium chloride 0.9 % 250 mL IVPB  Status:  Discontinued     300 mg 250 mL/hr over 60 Minutes Intravenous Every 12 hours 02/16/19 1019 02/23/19 1546   02/16/19 0824  vancomycin variable dose per unstable renal function (pharmacist dosing)  Status:  Discontinued      Does not apply See admin instructions 02/16/19 0824 02/16/19 1019   02/14/19 1200  vancomycin (VANCOCIN) IVPB 1000 mg/200 mL premix     1,000 mg 200 mL/hr over 60 Minutes Intravenous  Once 02/13/19 1058 02/14/19 1548   02/12/19 1400  vancomycin (VANCOCIN) IVPB 1000 mg/200 mL premix     1,000 mg 200 mL/hr over 60 Minutes Intravenous  Once 02/11/19 1204 02/12/19 1400   02/11/19 1200  vancomycin (VANCOCIN) 500 mg in sodium chloride 0.9 % 100 mL IVPB  Status:  Discontinued     500 mg 100 mL/hr over 60 Minutes Intravenous Every M-W-F (Hemodialysis) 02/09/19 1726 02/10/19 0906   02/11/19 1200  vancomycin (VANCOCIN) IVPB 1000 mg/200 mL premix  Status:  Discontinued     1,000 mg 200 mL/hr over 60 Minutes Intravenous Every M-W-F (Hemodialysis) 02/10/19 0906 02/11/19 1204   02/10/19 1200  vancomycin (VANCOCIN) IVPB 1000 mg/200 mL premix  Status:  Discontinued     1,000 mg 200 mL/hr over 60 Minutes Intravenous  Once 02/10/19 0908 02/10/19 0922   02/10/19 1200  vancomycin (VANCOCIN) 1,500 mg in sodium chloride 0.9 % 500 mL IVPB  Status:  Discontinued     1,500 mg 250 mL/hr over 120 Minutes Intravenous  Once 02/10/19 0922 02/10/19 0933   02/10/19 1200  vancomycin (VANCOCIN) 1,500 mg in sodium chloride 0.9 % 500 mL IVPB     1,500 mg 250 mL/hr over 120 Minutes Intravenous  Once 02/10/19 0933 02/10/19 1232   02/09/19 2200  piperacillin-tazobactam (ZOSYN) IVPB 3.375 g  Status:  Discontinued     3.375 g 12.5 mL/hr over 240 Minutes Intravenous Every 12 hours 02/09/19  1719 02/10/19 0923   02/09/19 1718  vancomycin variable dose per unstable renal function (pharmacist dosing)  Status:  Discontinued      Does not apply See admin instructions 02/09/19 1719 02/09/19 1726   02/09/19 1500  ceFEPIme (MAXIPIME) 2 g in sodium chloride 0.9 % 100 mL IVPB     2 g 200 mL/hr over 30 Minutes Intravenous  Once 02/09/19 1453 02/09/19 1622   02/09/19 1500  metroNIDAZOLE (FLAGYL) IVPB 500 mg     500 mg 100 mL/hr over 60 Minutes Intravenous  Once 02/09/19 1453 02/09/19 1756   02/09/19 1500  vancomycin (VANCOCIN) IVPB 1000 mg/200 mL premix     1,000 mg 200 mL/hr over 60 Minutes Intravenous  Once 02/09/19 1453 02/09/19 1756     Scheduled Meds: . acidophilus  2 capsule Oral BID  . alteplase  2 mg Intracatheter Once  . alteplase  2 mg Intracatheter Once  . apixaban  5 mg Oral BID  . aspirin  81 mg Per Tube Daily  .  B-complex with vitamin C  1 tablet Per Tube Daily  . Chlorhexidine Gluconate Cloth  6 each Topical Q0600  . epoetin (EPOGEN/PROCRIT) injection  10,000 Units Intravenous Q M,W,F-HD  . feeding supplement (PRO-STAT SUGAR FREE 64)  30 mL Per Tube Daily  . [START ON 04/17/2019] ferrous gluconate  324 mg Oral Daily  . fluticasone  2 spray Each Nare Daily  . folic acid  1 mg Intravenous Daily  . free water  20 mL Per Tube Q4H  . insulin aspart  0-15 Units Subcutaneous Q4H  . levothyroxine  50 mcg Intravenous Q0600  . mouth rinse  15 mL Mouth Rinse BID  . metoCLOPramide (REGLAN) injection  10 mg Intravenous Q6H  . midodrine  10 mg Oral Q M,W,F-HD  . pantoprazole (PROTONIX) IV  40 mg Intravenous Q12H   Continuous Infusions: . sodium chloride 20 mL (03/23/19 1852)  . dextrose 75 mL/hr at 04/10/19 0420  . dextrose 5 % and 0.9% NaCl 75 mL/hr at 04/10/19 1839  . feeding supplement (OSMOLITE 1.5 CAL) 1,000 mL (04/08/19 2045)   PRN Meds:.sodium chloride, acetaminophen, ipratropium-albuterol, lip balm, naphazoline-glycerin, ondansetron (ZOFRAN) IV, promethazine,  promethazine, sodium chloride, traMADol   Assessment: Principal Problem:   Sepsis due to methicillin resistant Staphylococcus aureus (MRSA) (HCC) Active Problems:   Type 2 diabetes mellitus with ESRD (end-stage renal disease) (HCC)   Atrial fibrillation, chronic (HCC)   Hypotension   Chronic diastolic CHF (congestive heart failure) (HCC)   ESRD (end stage renal disease) ([place])   Altered level of consciousness   Decubitus ulcer of heel, bilateral, stage 2 ([place])   MRSA bacteremia   Arm DVT (deep venous thromboembolism), acute, left (HCC)   Acute encephalopathy   Acute metabolic encephalopathy   Acute blood loss anemia   AF (paroxysmal atrial fibrillation) (HCC)   Diarrhea   Oropharyngeal dysphagia   Abscess   Plan: Tentative plan to perform upper endoscopy tomorrow with PEG replacement Eliquis has been on hold since 2/4  Dr. Vicente Males will assume care for the weekend   LOS: 60 days   Cristiana Yochim 04/10/2019, 6:41 PM

## 2019-04-10 NOTE — Progress Notes (Signed)
This note also relates to the following rows which could not be included: Pulse Rate - Cannot attach notes to unvalidated device data Resp - Cannot attach notes to unvalidated device data BP - Cannot attach notes to unvalidated device data  Hd started  

## 2019-04-10 NOTE — Progress Notes (Signed)
Jamie Arias at [facility] NAME: Jamie Arias    MR#:  [id number]  DATE OF BIRTH:  06-12-1955  SUBJECTIVE:  No overnight issues, seen and examined at bedside.  Patient was sleepy, it seems that she just woke up.  Patient is AO x1-2, confused and unable to offer any complaints.   Patient denied any active issues.   REVIEW OF SYSTEMS:   Review of Systems  Unable to perform ROS: Mental acuity   Tolerating Diet: PEG feeding Tolerating PT: no--bed bound  DRUG ALLERGIES:   Allergies  Allergen Reactions  . Nsaids Other (See Comments)    Contraindicated due to kidney disease.  Marland Kitchen Doxycycline Other (See Comments)    tremor    VITALS:  Blood pressure 136/61, pulse 72, temperature 98.4 F (36.9 C), temperature source Oral, resp. rate 15, height 5' 4.02" (1.626 m), weight 84.6 kg, SpO2 98 %.  PHYSICAL EXAMINATION:   Physical Exam limited due to pt participation and MS changes  Constitutional: NAD, awake, more expression in her face today, sitting next to her husband in a chair, saying a few words HEENT: conjunctivae and lids normal, EOMI HD cath+ CV: RRR 2+ systolic murmur at upper sternal boarders. Distal pulses +2.  No cyanosis.   RESP: CTA B/L over anterior, normal respiratory effort, on RA GI: +BS, NTND, PEG tube in place Extremities: Pressure ulcers bilateral feet, right upper arm swelling, bandage intact  SKIN: warm, dry, extensive bruising  LABORATORY PANEL:  CBC Recent Labs  Lab 04/10/19 1125  WBC 4.9  HGB 8.2*  HCT 25.6*  PLT 207    Chemistries  Recent Labs  Lab 04/10/19 1125  NA 138  K 3.6  CL 97*  CO2 33*  GLUCOSE 98  BUN 19  CREATININE 2.11*  CALCIUM 8.1*   Cardiac Enzymes No results for input(s): TROPONINI in the last 168 hours. RADIOLOGY:  DG Abd 1 View  Result Date: 04/09/2019 CLINICAL DATA:  Abdominal pain. EXAM: ABDOMEN - 1 VIEW COMPARISON:  Abdomen 04/05/2019. Chest x-ray 03/04/2019. CT 10/23/2015.  FINDINGS: Prior CABG. Surgical clips right upper quadrant. Tubing noted over the abdomen. Soft tissues the abdomen are unremarkable. Stool noted throughout the colon. No bowel distention or free air. Prominent skin folds again noted. Stable vascular calcification. Bibasilar subsegmental atelectasis and or scarring. Stable left pleural thickening noted most likely scarring. Degenerative change thoracolumbar spine and both hips. Diffuse osteopenia. No acute bony abnormality identified. IMPRESSION: No acute abnormality identified. Electronically Signed   By: Marcello Moores  Register   On: 04/09/2019 08:48   ASSESSMENT AND PLAN:  [name] a 64 y.o.white femalewith end-stage renal disease on hemodialysis, atrial fibrillation, chronic diastolic congestive heart failure, coronary artery disease status post CABG, diabetes mellitus type II, hypertension, hyperlipidemia, hypothyroidism, Covid positive in July 2020, recent nursing home stay in [place]. Prolonged hospitalization for MRSA bacteremia/sepsis and now with acute encephalopathy.  Acute metabolic encephalopathy:  -mental status improving slowly with patient saying a few words every now and then. Cannot hold meaningful conversation -MRI of brain and EEG negative. Patient remained afebrile with no leukocytosis. -B12 within normal range - IR  performed LP on 03/20/19 AM: CSF shows only 8 WBC, Protein elevated at 73, Glucose 83. Negative for Toxo. CSF Cx final -- negative.  - ID, Neurology assistance   -Overall long-term prognosis seems poor.  -Palliative care consulted -SLP eval recommended continue PEG tube feeding and aspiration precautions.  Reconsult once patient's mental status  improves Patient is n.p.o., PEG tube has been clogged plan for EGD and PEG tube reinsertion on Monday as per GI Started D5 NS at 75 ml/hr for maintenance fluids  Sepsis secondary to MRSA bacteremia.   -  Right arm graft was removed on 03/04/2019. -Completed  vancomycin for 6-week course on 04/01/2019.   Anemia of chronic illness.   -Status post transfusion 1 unit of PRBC on 03/17/2019 -  hemoglobin stable at 6.9---transfused on 04/07/19--9.5 -  Iron studies consistent with anemia of chronic illness.     -B12 within normal range -Continue EPO with dialysis.  Hypotension, chronic  Hypoalbuminemia  -on midodrine M-W-F -blood pressure currently stable  ESRD on HD (MWF).    -Currently using tunneled catheter as graft was removed due to MRSA bacteremia. -Continue with scheduled dialysis per nephrology -patient has tolerated for our dialysis treatment sitting up.  Left arm DVT.   -Currently on Eliquis - recent diagnosis of left arm DVT on 03/03/2019.  Hypothyroidism.  Repeat TSH elevated at 17.82. -continue Increased dose of Synthroid 100. - repeat TSH in 2 to 3 weeks.  Paroxysmal A. Fib.. Currently in sinus. Amiodarone was discontinued. -pt was on  Eliquis 5 mg p.o. twice daily, last dose on 04/08/2019 Held for 2 to 3 days for PEG tube insertion most likely on Monday, then resume Eliquis Pharmacy consulted for anticoagulation with Lovenox in the meantime  Type 2 diabetes. -Continue SSI. -s/p Lantus 10 units daily, DC'd on 04/10/2019 due to hypoglycemic episodes and patient is n.p.o. PEG tube has been clogged.  Chronic diastolic heart failure.  - Volume being managed by dialysis.  Bilateral decubitus ulcers.  Present on admission. -Continue with wound care. -Wound care nurse to follow sacral wound; consulted again on 03/24/19   Nutrition.  PEG placed 03/03/2019. PEG tube clogged on 2/2 or 2/3 SLP eval done, recommended n.p.o., continue PEG tube feeding and reconsult once mental status improves. GI consulted for PEG tube management  Acute hypoxic respiratory failure.  Resolved.  Patient was intubated initially, extubated on 03/05/2019. -On room air now  DVT prophylaxis: Eliquis last done 2/3, held due to PEG tube insertion  on Monday.  Pharmacy consulted for dosing Lovenox in the meantime Code Status: DNR Family Communication:  No one is available at bedside,   Ddisposition Plan: rehab [facility] to d/c: pt apparently does not have medicare days left and husband cannot afford private pay. Challenging discharge disposition!  TOTAL TIME TAKING CARE OF THIS PATIENT: *20* minutes.  >50% time spent on counselling and coordination of care  Note: This dictation was prepared with Dragon dictation along with smaller phrase technology. Any transcriptional errors that result from this process are unintentional.  Val Riles M.D    [facility]   CC: Primary care physician; [name], MDPatient ID: [name], female   DOB: 01-18-1956, 64 y.o.   MRN: [id number]

## 2019-04-10 NOTE — Progress Notes (Signed)
[facility]  ROUNDING NOTE   Subjective:   Seen and examined on hemodialysis treatment. Tolerating treatment well. UF goal of 2 liters. Required IV albumin.     HEMODIALYSIS FLOWSHEET:  Blood Flow Rate (mL/min): 350 mL/min Arterial Pressure (mmHg): -140 mmHg Venous Pressure (mmHg): 100 mmHg Transmembrane Pressure (mmHg): 60 mmHg Ultrafiltration Rate (mL/min): 860 mL/min Dialysate Flow Rate (mL/min): 600 ml/min Conductivity: Machine : 14.1 Conductivity: Machine : 14.1 Dialysis Fluid Bolus: Albumin Bolus Amount (mL): 50 mL Dialysate Change: (3K)    Objective:  Vital signs in last 24 hours:  Temp:  [97.5 F (36.4 C)-98.3 F (36.8 C)] 98.3 F (36.8 C) (02/05 1100) Pulse Rate:  [69-80] 70 (02/05 1315) Resp:  [10-26] 15 (02/05 1315) BP: (126-165)/(47-74) 147/63 (02/05 1315) SpO2:  [98 %-100 %] 98 % (02/05 1100)  Weight change:  Filed Weights   03/27/19 1045 03/31/19 1220 04/07/19 1700  Weight: 81 kg 83.5 kg 84.6 kg    Intake/Output: I/O last 3 completed shifts: In: 461 [I.V.:461] Out: -    Intake/Output this shift:  No intake/output data recorded.  Physical Exam: General: NAD, laying in bed  Head: Normocephalic, atraumatic.  Eyes: Anicteric, PERRL  Neck: Supple, trachea midline  Lungs:  Clear to auscultation  Heart: Regular rate and rhythm  Abdomen:  Soft, nontender,   Extremities:  right upper extremity edema.  Neurologic: Verbal but not consistent with answers, +tremor  Skin: No lesions  Access: LIJ permcath    Basic Metabolic Panel: Recent Labs  Lab 04/06/19 0930 04/06/19 0930 04/07/19 0538 04/07/19 0538 04/08/19 1201 04/09/19 0742 04/10/19 1125  NA 134*  --  135  --  141 141 138  K 5.1  --  5.0  --  3.8 4.4 3.6  CL 93*  --  97*  --  99 96* 97*  CO2 33*  --  31  --  34* 33* 33*  GLUCOSE 159*  --  126*  --  88 90 98  BUN 46*  --  24*  --  22 19 19   CREATININE 3.00*  --  1.89*  --  1.74* 1.98* 2.11*  CALCIUM 8.4*   < > 9.0   < >  8.3* 9.1 8.1*  PHOS 4.6  --  3.8  --  2.9 3.7 3.5   < > = values in this interval not displayed.    Liver Function Tests: Recent Labs  Lab 04/06/19 0930 04/07/19 0538 04/08/19 1201 04/09/19 0742 04/10/19 1125  ALBUMIN 1.9* 2.2* 2.0* 2.1* 1.9*   No results for input(s): LIPASE, AMYLASE in the last 168 hours. No results for input(s): AMMONIA in the last 168 hours.  CBC: Recent Labs  Lab 04/03/19 1434 04/06/19 0500 04/07/19 0538 04/08/19 1201 04/10/19 1125  WBC 5.0 5.2 5.6 5.1 4.9  HGB 7.3* 6.9* 9.5* 8.7* 8.2*  HCT 23.3* 22.5* 29.9* 27.0* 25.6*  MCV 99.6 100.4* 96.5 96.4 98.1  PLT 235 248 257 243 207    Cardiac Enzymes: No results for input(s): CKTOTAL, CKMB, CKMBINDEX, TROPONINI in the last 168 hours.  BNP: Invalid input(s): POCBNP  CBG: Recent Labs  Lab 04/10/19 0027 04/10/19 0402 04/10/19 0440 04/10/19 0523 04/10/19 0806  GLUCAP 127* 61* 81 80 82    Microbiology: Results for orders placed or performed during the hospital encounter of 02/09/19  Culture, blood (routine x 2)     Status: Abnormal   Collection Time: 02/09/19  2:29 PM   Specimen: BLOOD LEFT ARM  Result Value Ref Range  Status   Specimen Description   Final    BLOOD LEFT ARM Performed at [facility], [address] [place], [place] [address]    Special Requests   Final    BOTTLES DRAWN AEROBIC AND ANAEROBIC Blood Culture adequate volume Performed at [facility], [address] [place], [place] [address]    Culture  Setup Time   Final    GRAM POSITIVE COCCI IN BOTH AEROBIC AND ANAEROBIC BOTTLES CRITICAL VALUE NOTED.  VALUE IS CONSISTENT WITH PREVIOUSLY REPORTED AND CALLED VALUE. Performed at [facility], [address] [place], [place] [address]    Culture (A)  Final    STAPHYLOCOCCUS AUREUS SUSCEPTIBILITIES PERFORMED ON PREVIOUS CULTURE WITHIN THE LAST 5 DAYS. Performed at [facility], [place] [address] [place], [place] [address]    Report  Status 02/12/2019 FINAL  Final  Culture, blood (routine x 2)     Status: Abnormal   Collection Time: 02/09/19  2:38 PM   Specimen: BLOOD LEFT ARM  Result Value Ref Range Status   Specimen Description   Final    BLOOD LEFT ARM Performed at [facility], [address] [place], [place] [address]    Special Requests   Final    BOTTLES DRAWN AEROBIC AND ANAEROBIC Blood Culture adequate volume Performed at [facility], [address], [place], [place] [address]    Culture  Setup Time   Final    GRAM POSITIVE COCCI IN BOTH AEROBIC AND ANAEROBIC BOTTLES CRITICAL RESULT CALLED TO, READ BACK BY AND VERIFIED WITH: Savona [id number] 02/10/2019 HNM Performed at [facility], [place] [address], [place], [place] [address]    Culture METHICILLIN RESISTANT STAPHYLOCOCCUS AUREUS (A)  Final   Report Status 02/12/2019 FINAL  Final   Organism ID, Bacteria METHICILLIN RESISTANT STAPHYLOCOCCUS AUREUS  Final      Susceptibility   Methicillin resistant staphylococcus aureus - MIC*    CIPROFLOXACIN >=8 RESISTANT Resistant     ERYTHROMYCIN >=8 RESISTANT Resistant     GENTAMICIN <=0.5 SENSITIVE Sensitive     OXACILLIN >=4 RESISTANT Resistant     TETRACYCLINE <=1 SENSITIVE Sensitive     VANCOMYCIN 1 SENSITIVE Sensitive     TRIMETH/SULFA <=10 SENSITIVE Sensitive     CLINDAMYCIN RESISTANT Resistant     RIFAMPIN <=0.5 SENSITIVE Sensitive     Inducible Clindamycin POSITIVE Resistant     * METHICILLIN RESISTANT STAPHYLOCOCCUS AUREUS  Blood Culture ID Panel (Reflexed)     Status: Abnormal   Collection Time: 02/09/19  2:38 PM  Result Value Ref Range Status   Enterococcus species NOT DETECTED NOT DETECTED Final   Listeria monocytogenes NOT DETECTED NOT DETECTED Final   Staphylococcus species DETECTED (A) NOT DETECTED Final    Comment: CRITICAL RESULT CALLED TO, READ BACK BY AND VERIFIED WITH: Hart Robinsons PHARMD [id number] 02/10/2019 HNM    Staphylococcus aureus (BCID) DETECTED (A) NOT  DETECTED Final    Comment: Methicillin (oxacillin)-resistant Staphylococcus aureus (MRSA). MRSA is predictably resistant to beta-lactam antibiotics (except ceftaroline). Preferred therapy is vancomycin unless clinically contraindicated. Patient requires contact precautions if  hospitalized. CRITICAL RESULT CALLED TO, READ BACK BY AND VERIFIED WITH: Hart Robinsons PHARMD [id number] 02/10/2019 HNM    Methicillin resistance DETECTED (A) NOT DETECTED Final    Comment: CRITICAL RESULT CALLED TO, READ BACK BY AND VERIFIED WITH: Hart Robinsons PHARMD [id number] 02/10/2019 HNM    Streptococcus species NOT DETECTED NOT DETECTED Final   Streptococcus agalactiae NOT DETECTED  NOT DETECTED Final   Streptococcus pneumoniae NOT DETECTED NOT DETECTED Final   Streptococcus pyogenes NOT DETECTED NOT DETECTED Final   Acinetobacter baumannii NOT DETECTED NOT DETECTED Final   Enterobacteriaceae species NOT DETECTED NOT DETECTED Final   Enterobacter cloacae complex NOT DETECTED NOT DETECTED Final   Escherichia coli NOT DETECTED NOT DETECTED Final   Klebsiella oxytoca NOT DETECTED NOT DETECTED Final   Klebsiella pneumoniae NOT DETECTED NOT DETECTED Final   Proteus species NOT DETECTED NOT DETECTED Final   Serratia marcescens NOT DETECTED NOT DETECTED Final   Haemophilus influenzae NOT DETECTED NOT DETECTED Final   Neisseria meningitidis NOT DETECTED NOT DETECTED Final   Pseudomonas aeruginosa NOT DETECTED NOT DETECTED Final   Candida albicans NOT DETECTED NOT DETECTED Final   Candida glabrata NOT DETECTED NOT DETECTED Final   Candida krusei NOT DETECTED NOT DETECTED Final   Candida parapsilosis NOT DETECTED NOT DETECTED Final   Candida tropicalis NOT DETECTED NOT DETECTED Final    Comment: Performed at [facility], [place], [place] [address]  SARS CORONAVIRUS 2 (TAT 6-24 HRS) Nasopharyngeal Nasopharyngeal Swab     Status: None   Collection Time: 02/09/19  2:54 PM   Specimen: Nasopharyngeal Swab   Result Value Ref Range Status   SARS Coronavirus 2 NEGATIVE NEGATIVE Final    Comment: (NOTE) SARS-CoV-2 target nucleic acids are NOT DETECTED. The SARS-CoV-2 RNA is generally detectable in upper and lower respiratory specimens during the acute phase of infection. Negative results do not preclude SARS-CoV-2 infection, do not rule out co-infections with other pathogens, and should not be used as the sole basis for treatment or other patient management decisions. Negative results must be combined with clinical observations, patient history, and epidemiological information. The expected result is Negative. Fact Sheet for Patients: SugarRoll.be Fact Sheet for Healthcare Providers: https://www.woods-mathews.com/ This test is not yet approved or cleared by the [place] FDA and  has been authorized for detection and/or diagnosis of SARS-CoV-2 by FDA under an Emergency Use Authorization (EUA). This EUA will remain  in effect (meaning this test can be used) for the duration of the COVID-19 declaration under Section 56 4(b)(1) of the Act, 21 U.S.C. section 360bbb-3(b)(1), unless the authorization is terminated or revoked sooner. Performed at [facility], [place] [address], [place], [place] [address]   MRSA PCR Screening     Status: Abnormal   Collection Time: 02/10/19  5:03 PM   Specimen: Nasopharyngeal  Result Value Ref Range Status   MRSA by PCR POSITIVE (A) NEGATIVE Final    Comment:        The GeneXpert MRSA Assay (FDA approved for NASAL specimens only), is one component of a comprehensive MRSA colonization surveillance program. It is not intended to diagnose MRSA infection nor to guide or monitor treatment for MRSA infections. RESULT CALLED TO, READ BACK BY AND VERIFIED WITH: ALEKSEY FRASER @1839  02/10/19 MJU Performed at [facility], [address] [place], [place] [address]   Culture, blood (routine x 2)      Status: Abnormal   Collection Time: 02/11/19 12:31 PM   Specimen: BLOOD  Result Value Ref Range Status   Specimen Description   Final    BLOOD PORTA CATH Performed at [facility] [address], [place], [place] [address]    Special Requests   Final    BOTTLES DRAWN AEROBIC AND ANAEROBIC Blood Culture adequate volume Performed at [facility], [place], [place]  [address]    Culture  Setup Time   Final    GRAM POSITIVE COCCI ANAEROBIC BOTTLE ONLY CRITICAL RESULT CALLED TO, READ BACK BY AND VERIFIED WITH: SCOTT HALL AT [id number] 02/12/2019 [facility]  Performed at [facility], [address] [place], [place] [address]    Culture (A)  Final    STAPHYLOCOCCUS AUREUS SUSCEPTIBILITIES PERFORMED ON PREVIOUS CULTURE WITHIN THE LAST 5 DAYS. Performed at [facility], [place] [address], [place], [place] [address]    Report Status 02/13/2019 FINAL  Final  Culture, blood (routine x 2)     Status: None   Collection Time: 02/11/19  3:57 PM   Specimen: BLOOD  Result Value Ref Range Status   Specimen Description BLOOD PORTA CATH  Final   Special Requests   Final    BOTTLES DRAWN AEROBIC AND ANAEROBIC Blood Culture adequate volume   Culture   Final    NO GROWTH 5 DAYS Performed at [facility], [address] [place], [place] [address]    Report Status 02/16/2019 FINAL  Final  CULTURE, BLOOD (ROUTINE X 2) w Reflex to ID Panel     Status: Abnormal   Collection Time: 02/13/19  2:18 PM   Specimen: BLOOD LEFT HAND  Result Value Ref Range Status   Specimen Description BLOOD LEFT HAND  Final   Special Requests   Final    BOTTLES DRAWN AEROBIC AND ANAEROBIC Blood Culture results may not be optimal due to an inadequate volume of blood received in culture bottles   Culture  Setup Time   Final    AEROBIC BOTTLE ONLY GRAM POSITIVE COCCI IN CLUSTERS CRITICAL RESULT CALLED TO, READ BACK BY AND VERIFIED WITH: Herbert Pun AT [id number] ON 02/14/2019  [place]. Performed at [facility], [place] [address], [place], [place] [address]    Culture METHICILLIN RESISTANT STAPHYLOCOCCUS AUREUS (A)  Final   Report Status 02/16/2019 FINAL  Final   Organism ID, Bacteria METHICILLIN RESISTANT STAPHYLOCOCCUS AUREUS  Final      Susceptibility   Methicillin resistant staphylococcus aureus - MIC*    CIPROFLOXACIN >=8 RESISTANT Resistant     ERYTHROMYCIN >=8 RESISTANT Resistant     GENTAMICIN <=0.5 SENSITIVE Sensitive     OXACILLIN >=4 RESISTANT Resistant     TETRACYCLINE <=1 SENSITIVE Sensitive     VANCOMYCIN <=0.5 SENSITIVE Sensitive     TRIMETH/SULFA <=10 SENSITIVE Sensitive     CLINDAMYCIN RESISTANT Resistant     RIFAMPIN <=0.5 SENSITIVE Sensitive     Inducible Clindamycin POSITIVE Resistant     * METHICILLIN RESISTANT STAPHYLOCOCCUS AUREUS  Blood Culture ID Panel (Reflexed)     Status: Abnormal   Collection Time: 02/13/19  2:18 PM  Result Value Ref Range Status   Enterococcus species NOT DETECTED NOT DETECTED Final   Listeria monocytogenes NOT DETECTED NOT DETECTED Final   Staphylococcus species DETECTED (A) NOT DETECTED Final    Comment: CRITICAL RESULT CALLED TO, READ BACK BY AND VERIFIED WITH: ABBEY ELLINGTON AT [id number] ON 02/14/2019 [place].    Staphylococcus aureus (BCID) DETECTED (A) NOT DETECTED Final    Comment: Methicillin (oxacillin)-resistant Staphylococcus aureus (MRSA). MRSA is predictably resistant to beta-lactam antibiotics (except ceftaroline). Preferred therapy is vancomycin unless clinically contraindicated. Patient requires contact precautions if  hospitalized. CRITICAL RESULT CALLED TO, READ BACK BY AND VERIFIED WITH: Herbert Pun AT [id number] ON 02/14/2019 [place].    Methicillin resistance DETECTED (A) NOT DETECTED Final    Comment: CRITICAL RESULT CALLED TO, READ BACK  BY AND VERIFIED WITH: Herbert Pun AT [id number] ON 02/14/2019 [place].    Streptococcus species NOT DETECTED NOT DETECTED Final   Streptococcus agalactiae NOT DETECTED  NOT DETECTED Final   Streptococcus pneumoniae NOT DETECTED NOT DETECTED Final   Streptococcus pyogenes NOT DETECTED NOT DETECTED Final   Acinetobacter baumannii NOT DETECTED NOT DETECTED Final   Enterobacteriaceae species NOT DETECTED NOT DETECTED Final   Enterobacter cloacae complex NOT DETECTED NOT DETECTED Final   Escherichia coli NOT DETECTED NOT DETECTED Final   Klebsiella oxytoca NOT DETECTED NOT DETECTED Final   Klebsiella pneumoniae NOT DETECTED NOT DETECTED Final   Proteus species NOT DETECTED NOT DETECTED Final   Serratia marcescens NOT DETECTED NOT DETECTED Final   Haemophilus influenzae NOT DETECTED NOT DETECTED Final   Neisseria meningitidis NOT DETECTED NOT DETECTED Final   Pseudomonas aeruginosa NOT DETECTED NOT DETECTED Final   Candida albicans NOT DETECTED NOT DETECTED Final   Candida glabrata NOT DETECTED NOT DETECTED Final   Candida krusei NOT DETECTED NOT DETECTED Final   Candida parapsilosis NOT DETECTED NOT DETECTED Final   Candida tropicalis NOT DETECTED NOT DETECTED Final    Comment: Performed at [facility], [address] [place], [place] [address]  CULTURE, BLOOD (ROUTINE X 2) w Reflex to ID Panel     Status: None   Collection Time: 02/13/19  3:09 PM   Specimen: BLOOD  Result Value Ref Range Status   Specimen Description BLOOD BLOOD LEFT HAND  Final   Special Requests   Final    BOTTLES DRAWN AEROBIC AND ANAEROBIC Blood Culture adequate volume   Culture   Final    NO GROWTH 5 DAYS Performed at [facility], [address], [place], [place] [address]    Report Status 02/18/2019 FINAL  Final  Culture, blood (routine x 2)     Status: Abnormal   Collection Time: 02/15/19 12:49 PM   Specimen: BLOOD  Result Value Ref Range Status   Specimen Description   Final    BLOOD LT HAND Performed at [facility], [address] [place], [place] [address]    Special Requests   Final    BOTTLES DRAWN AEROBIC AND ANAEROBIC Blood  Culture results may not be optimal due to an inadequate volume of blood received in culture bottles Performed at [facility], [address] [place], [place] [address]    Culture  Setup Time   Final    GRAM POSITIVE COCCI IN BOTH AEROBIC AND ANAEROBIC BOTTLES CRITICAL RESULT CALLED TO, READ BACK BY AND VERIFIED WITHEleonore Chiquito AT [id number] 02/16/2019 SDR Performed at [facility], [address] [place], [place] [address]    Culture (A)  Final    METHICILLIN RESISTANT STAPHYLOCOCCUS AUREUS STAPHYLOCOCCUS SPECIES (COAGULASE NEGATIVE) THE SIGNIFICANCE OF ISOLATING THIS ORGANISM FROM A SINGLE SET OF BLOOD CULTURES WHEN MULTIPLE SETS ARE DRAWN IS UNCERTAIN. PLEASE NOTIFY THE MICROBIOLOGY DEPARTMENT WITHIN ONE WEEK IF SPECIATION AND SENSITIVITIES ARE REQUIRED. Performed at [facility], [place] [address], [place], [place] [address]    Report Status 02/18/2019 FINAL  Final   Organism ID, Bacteria METHICILLIN RESISTANT STAPHYLOCOCCUS AUREUS  Final      Susceptibility   Methicillin resistant staphylococcus aureus - MIC*    CIPROFLOXACIN >=8 RESISTANT Resistant     ERYTHROMYCIN >=8 RESISTANT Resistant     GENTAMICIN <=0.5 SENSITIVE Sensitive     OXACILLIN >=4 RESISTANT Resistant     TETRACYCLINE <=1 SENSITIVE Sensitive  VANCOMYCIN <=0.5 SENSITIVE Sensitive     TRIMETH/SULFA <=10 SENSITIVE Sensitive     CLINDAMYCIN RESISTANT Resistant     RIFAMPIN <=0.5 SENSITIVE Sensitive     Inducible Clindamycin POSITIVE Resistant     * METHICILLIN RESISTANT STAPHYLOCOCCUS AUREUS  Aerobic Culture (superficial specimen)     Status: None   Collection Time: 02/17/19  1:31 PM   Specimen: Heel; Wound  Result Value Ref Range Status   Specimen Description   Final    HEEL Performed at [facility], [address], [place], [place] [address]    Special Requests   Final    NONE Performed at [facility], [address] [place], [place] [address]    Gram Stain    Final    NO WBC SEEN MODERATE GRAM POSITIVE COCCI IN PAIRS    Culture   Final    ABUNDANT METHICILLIN RESISTANT STAPHYLOCOCCUS AUREUS ABUNDANT DIPHTHEROIDS(CORYNEBACTERIUM SPECIES) Standardized susceptibility testing for this organism is not available. Performed at [facility], [place] [address], [place], [place] [address]    Report Status 02/19/2019 FINAL  Final   Organism ID, Bacteria METHICILLIN RESISTANT STAPHYLOCOCCUS AUREUS  Final      Susceptibility   Methicillin resistant staphylococcus aureus - MIC*    CIPROFLOXACIN >=8 RESISTANT Resistant     ERYTHROMYCIN >=8 RESISTANT Resistant     GENTAMICIN <=0.5 SENSITIVE Sensitive     OXACILLIN >=4 RESISTANT Resistant     TETRACYCLINE <=1 SENSITIVE Sensitive     VANCOMYCIN 1 SENSITIVE Sensitive     TRIMETH/SULFA <=10 SENSITIVE Sensitive     CLINDAMYCIN RESISTANT Resistant     RIFAMPIN <=0.5 SENSITIVE Sensitive     Inducible Clindamycin POSITIVE Resistant     * ABUNDANT METHICILLIN RESISTANT STAPHYLOCOCCUS AUREUS  CULTURE, BLOOD (ROUTINE X 2) w Reflex to ID Panel     Status: None   Collection Time: 02/18/19 11:29 PM   Specimen: BLOOD  Result Value Ref Range Status   Specimen Description BLOOD LEFT ARM  Final   Special Requests   Final    BOTTLES DRAWN AEROBIC AND ANAEROBIC Blood Culture adequate volume   Culture   Final    NO GROWTH 5 DAYS Performed at [facility], [address] [place], [place] [address]    Report Status 02/23/2019 FINAL  Final  CULTURE, BLOOD (ROUTINE X 2) w Reflex to ID Panel     Status: None   Collection Time: 02/18/19 11:29 PM   Specimen: BLOOD  Result Value Ref Range Status   Specimen Description BLOOD LEFT ARM  Final   Special Requests   Final    BOTTLES DRAWN AEROBIC AND ANAEROBIC Blood Culture adequate volume   Culture   Final    NO GROWTH 5 DAYS Performed at [facility], [address], [place], [place] [address]    Report Status 02/23/2019 FINAL  Final   Aerobic/Anaerobic Culture (surgical/deep wound)     Status: None   Collection Time: 03/04/19 11:04 AM   Specimen: PATH Other; Tissue  Result Value Ref Range Status   Specimen Description   Final    ARTERY Performed at [facility], [address] [place], [place] [address]    Special Requests   Final    RIGHT AV GRAFT AND STENTS CULTURE Performed at [facility], [address] [place], [place] [address]    Gram Stain   Final    RARE WBC PRESENT, PREDOMINANTLY PMN NO ORGANISMS SEEN  Culture   Final    No growth aerobically or anaerobically. Performed at [facility], [place] [address], [place], [place] [address]    Report Status 03/09/2019 FINAL  Final  Aerobic/Anaerobic Culture (surgical/deep wound)     Status: None   Collection Time: 03/04/19 11:29 AM   Specimen: PATH Other; Tissue  Result Value Ref Range Status   Specimen Description   Final    ARTERY Performed at [facility], [address] [place], [place] [address]    Special Requests   Final    FLUID AROUND AV GRAFT Performed at [facility], [address] [place], [place] [address]    Gram Stain   Final    FEW WBC PRESENT, PREDOMINANTLY PMN NO ORGANISMS SEEN    Culture   Final    No growth aerobically or anaerobically. Performed at [facility], [place] [address], [place], [place] [address]    Report Status 03/09/2019 FINAL  Final  Wet prep, genital     Status: Abnormal   Collection Time: 03/16/19 10:01 AM  Result Value Ref Range Status   Yeast Wet Prep HPF POC PRESENT (A) NONE SEEN Final   Trich, Wet Prep NONE SEEN NONE SEEN Final   Clue Cells Wet Prep HPF POC NONE SEEN NONE SEEN Final   WBC, Wet Prep HPF POC MANY (A) NONE SEEN Final   Sperm NONE SEEN  Final    Comment: Performed at [facility], [address] [place], [place] [address]  Aerobic Culture (superficial specimen)     Status: None   Collection Time: 03/17/19  5:13 PM    Specimen: Vaginal Fluid  Result Value Ref Range Status   Specimen Description   Final    VAGINA Performed at [facility], [address], [place], [place] [address]    Special Requests   Final    NONE Performed at [facility], [address] [place], [place] [address]    Gram Stain   Final    MODERATE WBC PRESENT,BOTH PMN AND MONONUCLEAR FEW GRAM POSITIVE COCCI IN PAIRS FEW GRAM NEGATIVE RODS Performed at [facility], [place] [address] [place], [place] [address]    Culture   Final    ABUNDANT ESCHERICHIA COLI MODERATE KLEBSIELLA PNEUMONIAE    Report Status 03/21/2019 FINAL  Final   Organism ID, Bacteria ESCHERICHIA COLI  Final   Organism ID, Bacteria KLEBSIELLA PNEUMONIAE  Final      Susceptibility   Escherichia coli - MIC*    AMPICILLIN 4 SENSITIVE Sensitive     CEFAZOLIN <=4 SENSITIVE Sensitive     CEFEPIME <=0.12 SENSITIVE Sensitive     CEFTAZIDIME <=1 SENSITIVE Sensitive     CEFTRIAXONE <=0.25 SENSITIVE Sensitive     CIPROFLOXACIN <=0.25 SENSITIVE Sensitive     GENTAMICIN <=1 SENSITIVE Sensitive     IMIPENEM <=0.25 SENSITIVE Sensitive     TRIMETH/SULFA <=20 SENSITIVE Sensitive     AMPICILLIN/SULBACTAM <=2 SENSITIVE Sensitive     PIP/TAZO <=4 SENSITIVE Sensitive     * ABUNDANT ESCHERICHIA COLI   Klebsiella pneumoniae - MIC*    AMPICILLIN >=32 RESISTANT Resistant     CEFAZOLIN <=4 SENSITIVE Sensitive     CEFEPIME <=0.12 SENSITIVE Sensitive     CEFTAZIDIME <=1 SENSITIVE Sensitive     CEFTRIAXONE <=0.25 SENSITIVE Sensitive     CIPROFLOXACIN <=0.25 SENSITIVE Sensitive     GENTAMICIN <=1 SENSITIVE Sensitive     IMIPENEM <=0.25 SENSITIVE Sensitive  TRIMETH/SULFA <=20 SENSITIVE Sensitive     AMPICILLIN/SULBACTAM 4 SENSITIVE Sensitive     PIP/TAZO <=4 SENSITIVE Sensitive     * MODERATE KLEBSIELLA PNEUMONIAE  Anaerobic culture     Status: None   Collection Time: 03/20/19 12:40 PM   Specimen: PATH Cytology CSF; Cerebrospinal Fluid  Result  Value Ref Range Status   Specimen Description   Final    CSF Performed at [facility], [address], [place], [place] [address]    Special Requests   Final    NONE Performed at [facility], [address] [place], [place] [address]    Gram Stain   Final    WBC PRESENT, PREDOMINANTLY MONONUCLEAR NO ORGANISMS SEEN CYTOSPIN SMEAR    Culture   Final    NO ANAEROBES ISOLATED Performed at [facility], [place] [address] [place], [place] [address]    Report Status 03/27/2019 FINAL  Final  CSF culture     Status: None   Collection Time: 03/20/19 12:40 PM   Specimen: PATH Cytology CSF; Cerebrospinal Fluid  Result Value Ref Range Status   Specimen Description   Final    CSF Performed at [facility], [address] [place], [place] [address]    Special Requests   Final    NONE Performed at [facility], [address] [place], [place] [address]    Gram Stain   Final    WBC PRESENT, PREDOMINANTLY MONONUCLEAR NO ORGANISMS SEEN CYTOSPIN SMEAR    Culture   Final    NO GROWTH 3 DAYS Performed at [facility], [place] [address], [place], [place] [address]    Report Status 03/24/2019 FINAL  Final  Fungus Culture With Stain     Status: None (Preliminary result)   Collection Time: 03/20/19 12:40 PM   Specimen: PATH Cytology CSF; Cerebrospinal Fluid  Result Value Ref Range Status   Fungus Stain Final report  Final    Comment: (NOTE) Performed At: [facility] [address] [place], [place] [id number] Rush Farmer MD [id number]    Fungus (Mycology) Culture PENDING  Incomplete   Fungal Source CSF  Final    Comment: Performed at [facility], [address] [place], [place] [address]  Fungus Culture Result     Status: None   Collection Time: 03/20/19 12:40 PM  Result Value Ref Range Status   Result 1 Comment  Final    Comment: (NOTE) KOH/Calcofluor preparation:  no fungus observed. Performed At: [facility] [place], [place] [id number] Rush Farmer MD [id number]   MRSA PCR Screening     Status: None   Collection Time: 04/03/19  8:15 PM   Specimen: Nasopharyngeal  Result Value Ref Range Status   MRSA by PCR NEGATIVE NEGATIVE Final    Comment:        The GeneXpert MRSA Assay (FDA approved for NASAL specimens only), is one component of a comprehensive MRSA colonization surveillance program. It is not intended to diagnose MRSA infection nor to guide or monitor treatment for MRSA infections. Performed at [facility], [address] [place], [place] [address]     Coagulation Studies: No results for input(s): LABPROT, INR in the last 72 hours.  Urinalysis: No results for input(s): COLORURINE, LABSPEC, PHURINE, GLUCOSEU, HGBUR, BILIRUBINUR, KETONESUR, PROTEINUR, UROBILINOGEN, NITRITE, LEUKOCYTESUR in the last 72 hours.  Invalid input(s): APPERANCEUR    Imaging: DG Abd 1 View  Result Date: 04/09/2019 CLINICAL DATA:  Abdominal pain. EXAM: ABDOMEN - 1 VIEW COMPARISON:  Abdomen 04/05/2019. Chest x-ray 03/04/2019. CT 10/23/2015. FINDINGS: Prior CABG. Surgical clips right upper quadrant. Tubing noted over the abdomen. Soft tissues the abdomen are unremarkable. Stool noted throughout the colon. No bowel distention or free air. Prominent skin folds again noted. Stable vascular calcification. Bibasilar subsegmental atelectasis and or scarring. Stable left pleural thickening noted most likely scarring. Degenerative change thoracolumbar spine and both hips. Diffuse osteopenia. No acute bony abnormality identified. IMPRESSION: No acute abnormality identified. Electronically Signed   By: Marcello Moores  Register   On: 04/09/2019 08:48     Medications:   . sodium chloride 20 mL (03/23/19 1852)  . dextrose 75 mL/hr at 04/10/19 0420  . feeding supplement (OSMOLITE 1.5 CAL) 1,000 mL (04/08/19 2045)   . acidophilus  2 capsule Oral BID  . alteplase  2 mg  Intracatheter Once  . alteplase  2 mg Intracatheter Once  . apixaban  5 mg Oral BID  . aspirin  81 mg Per Tube Daily  . B-complex with vitamin C  1 tablet Per Tube Daily  . Chlorhexidine Gluconate Cloth  6 each Topical Q0600  . epoetin (EPOGEN/PROCRIT) injection  10,000 Units Intravenous Q M,W,F-HD  . feeding supplement (PRO-STAT SUGAR FREE 64)  30 mL Per Tube Daily  . [START ON 04/17/2019] ferrous gluconate  324 mg Oral Daily  . fluticasone  2 spray Each Nare Daily  . folic acid  1 mg Intravenous Daily  . free water  20 mL Per Tube Q4H  . insulin aspart  0-15 Units Subcutaneous Q4H  . levothyroxine  50 mcg Intravenous Q0600  . mouth rinse  15 mL Mouth Rinse BID  . metoCLOPramide (REGLAN) injection  10 mg Intravenous Q6H  . midodrine  10 mg Oral Q M,W,F-HD  . pantoprazole (PROTONIX) IV  40 mg Intravenous Q12H   sodium chloride, acetaminophen, ipratropium-albuterol, lip balm, naphazoline-glycerin, ondansetron (ZOFRAN) IV, promethazine, promethazine, sodium chloride, traMADol  Assessment/ Plan:  Jamie Arias is a 64 y.o. white female 10 year oldwith end-stage renal disease, atrial fibrillation, chronic diastolic congestive heart failure, CAD status post CABG, type 2 diabetes, hypertension, hyperlipidemia, hypothyroidism, Covid positive in July 2020, recent nursing home stay in [place],Prolonged hospitalization for MRSA bacteremia/sepsis and now with acute encephalopathy  [facility] MWF [facility]. LIJ permcath 83.5kg  1. End Stage Renal Disease on hemodialysis.  Seen and examined on hemodialysis treatment - MWF schedule - albumin with HD treatments.   2. Anemia of chronic kidney disease: status post PRBC transfusion on 2/1 - EPO with HD treatment.   3. Hypotension:   - midodrine with HD treatment.   4. Secondary Hyperparathyroidism: not currently on binders. PTH 139, on 02/06/2019.   5. Encephalopathy: Continues to have improvement.     LOS: [place] 2/5/20211:57 PM

## 2019-04-10 NOTE — Progress Notes (Signed)
PT Cancellation Note  Patient Details Name: Jamie Arias MRN: [id number] DOB: 1956-02-07   Cancelled Treatment:    Reason Eval/Treat Not Completed: Patient at procedure or test/unavailable   Pt off unit for dialysis.  Will continue as appropriate.    Chesley Noon 04/10/2019, 10:35 AM

## 2019-04-10 NOTE — Progress Notes (Signed)
Rechecked patient's blood sugar and it increased to 81.  Checked blood sugar 30 minutes later and it was 80. Spoke with NP O[place] about this and she wants to continue to monitor patient because it is heading in the right direction due to the change of fluids.  Will continue to monitor.  Christene Slates

## 2019-04-10 NOTE — Progress Notes (Signed)
Spoke to NP [facility] regarding patient's blood sugar dropping to 61.  Instructed to give 12.5 mL of D50 and increase D10 fluid rate to 75 mL/hr.  Will recheck blood sugar in 15 minutes. Continue to monitor.  Christene Slates

## 2019-04-10 NOTE — Progress Notes (Signed)
This note also relates to the following rows which could not be included: Pulse Rate - Cannot attach notes to unvalidated device data Resp - Cannot attach notes to unvalidated device data BP - Cannot attach notes to unvalidated device data  Hd completed  

## 2019-04-10 NOTE — Progress Notes (Signed)
Spoke to NP [facility] regarding patient's blood sugar dropping so she ordered D10 @ 50mL. Will recheck blood sugar at 0400.  Christene Slates

## 2019-04-11 ENCOUNTER — Inpatient Hospital Stay: Payer: Medicare Other | Admitting: Anesthesiology

## 2019-04-11 ENCOUNTER — Encounter
Admission: EM | Disposition: A | Discharge status: Skilled Nursing Facility | Payer: Medicare Other | Source: Home / Self Care | Attending: Internal Medicine

## 2019-04-11 DIAGNOSIS — K9423 Gastrostomy malfunction: Secondary | ICD-10-CM

## 2019-04-11 HISTORY — PX: ESOPHAGOGASTRODUODENOSCOPY: SHX5428

## 2019-04-11 LAB — RENAL FUNCTION PANEL
Albumin: 2.5 g/dL — ABNORMAL LOW (ref 3.5–5.0)
Anion gap: 6 (ref 5–15)
BUN: 10 mg/dL (ref 8–23)
CO2: 36 mmol/L — ABNORMAL HIGH (ref 22–32)
Calcium: 8.5 mg/dL — ABNORMAL LOW (ref 8.9–10.3)
Chloride: 99 mmol/L (ref 98–111)
Creatinine, Ser: 1.78 mg/dL — ABNORMAL HIGH (ref 0.44–1.00)
GFR calc Af Amer: 34 mL/min — ABNORMAL LOW (ref >60)
GFR calc non Af Amer: 29 mL/min — ABNORMAL LOW (ref >60)
Glucose, Bld: 80 mg/dL (ref 70–99)
Phosphorus: 3.5 mg/dL (ref 2.5–4.6)
Potassium: 3.6 mmol/L (ref 3.5–5.1)
Sodium: 141 mmol/L (ref 135–145)

## 2019-04-11 LAB — GLUCOSE, CAPILLARY
Glucose-Capillary: 120 mg/dL — ABNORMAL HIGH (ref 70–99)
Glucose-Capillary: 70 mg/dL (ref 70–99)
Glucose-Capillary: 76 mg/dL (ref 70–99)
Glucose-Capillary: 78 mg/dL (ref 70–99)
Glucose-Capillary: 79 mg/dL (ref 70–99)
Glucose-Capillary: 81 mg/dL (ref 70–99)
Glucose-Capillary: 91 mg/dL (ref 70–99)

## 2019-04-11 SURGERY — EGD (ESOPHAGOGASTRODUODENOSCOPY)
Anesthesia: General

## 2019-04-11 MED ORDER — ENOXAPARIN SODIUM 40 MG/0.4ML ~~LOC~~ SOLN
40.0000 mg | SUBCUTANEOUS | Status: DC
  Administered 2019-04-11: 40 mg via SUBCUTANEOUS
  Filled 2019-04-11: qty 0.4

## 2019-04-11 MED ORDER — LACTATED RINGERS IV SOLN: INTRAVENOUS | Status: DC | PRN

## 2019-04-11 MED ORDER — APIXABAN 5 MG PO TABS
5.0000 mg | ORAL_TABLET | Freq: Two times a day (BID) | ORAL | Status: DC
  Administered 2019-04-11 – 2019-04-19 (×17): 5 mg via ORAL
  Filled 2019-04-11 (×18): qty 1

## 2019-04-11 MED ORDER — PROPOFOL 10 MG/ML IV BOLUS
INTRAVENOUS | Status: DC | PRN
  Administered 2019-04-11: 20 mg via INTRAVENOUS
  Administered 2019-04-11: 60 mg via INTRAVENOUS
  Administered 2019-04-11: 20 mg via INTRAVENOUS

## 2019-04-11 MED ORDER — LEVOTHYROXINE SODIUM 88 MCG PO TABS
88.0000 ug | ORAL_TABLET | Freq: Every day | ORAL | Status: DC
  Administered 2019-04-12 – 2019-04-20 (×9): 88 ug
  Filled 2019-04-11 (×10): qty 1

## 2019-04-11 NOTE — Progress Notes (Signed)
PEG tube replaced during procedure. Antibiotic cream applied and gauze applied to area.

## 2019-04-11 NOTE — Op Note (Signed)
[facility] Patient Name: Jamie Arias Procedure Date: 04/11/2019 10:34 AM MRN: [id number] Account #: [id number] Date of Birth: 1955/12/03 Admit Type: Inpatient Age: 64 Room: [facility] ENDO ROOM 4 Gender: Female Note Status: Finalized Procedure:             Upper GI endoscopy Indications:           Replace PEG tube, Replace PEG tube due to clogged                         gastrostomy tube Providers:             Jonathon Bellows MD, MD Medicines:             Monitored Anesthesia Care Complications:         No immediate complications. Procedure:             Pre-Anesthesia Assessment:                        - Prior to the procedure, a History and Physical was                         performed, and patient medications, allergies and                         sensitivities were reviewed. The patient's tolerance                         of previous anesthesia was reviewed.                        - The risks and benefits of the procedure and the                         sedation options and risks were discussed with the                         patient. All questions were answered and informed                         consent was obtained.                        - ASA Grade Assessment: III - A patient with severe                         systemic disease.                        After obtaining informed consent, the endoscope was                         passed under direct vision. Throughout the procedure,                         the patient's blood pressure, pulse, and oxygen                         saturations were monitored continuously. The Endoscope  was introduced through the mouth, and advanced to the                         third part of duodenum. The upper GI endoscopy was                         accomplished with ease. The patient tolerated the                         procedure well. Findings:      The esophagus was normal.      The  examined duodenum was normal.      There was evidence of an intact gastrostomy with an occluded G-tube       present on the lesser curvature of the stomach. The PEG required removal       because it was no longer necessary. The PEG was cut externally, grasped,       and removed with the scope. Removal was easily accomplished. An       endoscopically removable 20 Fr Bard gastrostomy tube was lubricated. The       guide wire was passed through the existing G-tube port and snared       endoscopically. The endoscope and snare were then removed, pulling the       wire out through the mouth. The g-tube was tied to the guidewire, pulled       through the mouth into the stomach and then pulled out from the stomach       through the skin. The bumper was attached to the gastrostomy tube. The       feeding tube was then cut to an appropriate length. The final position       of the gastrostomy tube was confirmed by relook endoscopy, and skin       marking noted to be 4.5 cm at the external bumper. The final tension and       compression of the abdominal wall by the PEG tube and external bumper       were checked and revealed that the bumper was loose and not touching the       skin. The tube was capped, and the tube site was cleaned and dressed.      The stomach was normal. Impression:            - Normal esophagus.                        - Normal examined duodenum.                        - Intact gastrostomy with an occluded G-tube present.                        - Normal stomach.                        - The PEG was removed because it was no longer                         necessary, and replaced with an endoscopically                         removable  PEG.                        - No specimens collected. Recommendation:        - Return patient to hospital ward for ongoing care. Procedure Code(s):     --- Professional ---                        [phone], Esophagogastroduodenoscopy, flexible,                          transoral; with directed placement of percutaneous                         gastrostomy tube Diagnosis Code(s):     --- Professional ---                        [id number], Gastrostomy malfunction                        Z43.1, Encounter for attention to gastrostomy CPT copyright 2019 American Medical Association. All rights reserved. The codes documented in this report are preliminary and upon coder review may  be revised to meet current compliance requirements. Jonathon Bellows, MD Jonathon Bellows MD, MD 04/11/2019 10:54:15 AM This report has been signed electronically. Number of Addenda: 0 Note Initiated On: 04/11/2019 10:34 AM Estimated Blood Loss:  Estimated blood loss: none.      [facility]

## 2019-04-11 NOTE — Progress Notes (Signed)
Jamie Arias at [place] NAME: Jamie Arias    MR#:  [id number]  DATE OF BIRTH:  02-Jan-1956  SUBJECTIVE:  Pt can respond to simple questions with yes/no.  No fever, dyspnea, chest pain, abdominal pain, N/V/D.  Had PEG tube exchange today.  Tolerated it well, resumed tube feed afterwards.   REVIEW OF SYSTEMS:   Review of Systems  Unable to perform ROS: Mental acuity   Tolerating Diet: PEG feeding Tolerating PT: not able to participate fully yet  DRUG ALLERGIES:   Allergies  Allergen Reactions  . Nsaids Other (See Comments)    Contraindicated due to kidney disease.  Marland Kitchen Doxycycline Other (See Comments)    tremor    VITALS:  Blood pressure (!) 171/54, pulse 84, temperature 98.6 F (37 C), temperature source Oral, resp. rate 16, height 5' 4.02" (1.626 m), weight 84.6 kg, SpO2 95 %.  PHYSICAL EXAMINATION:   Physical Exam limited due to pt participation and MS changes  Constitutional: NAD, awake, more expression in her face today, able to respond to simple questions HEENT: conjunctivae and lids normal, EOMI HD cath+ CV: RRR 2+ systolic murmur at upper sternal boarders. Distal pulses +2.  No cyanosis.   RESP: CTA B/L over anterior, normal respiratory effort, on RA GI: +BS, NTND, PEG tube in place Extremities: Pressure ulcers bilateral feet, right upper arm swelling, bandage intact  SKIN: warm, dry, extensive bruising   LABORATORY PANEL:  CBC Recent Labs  Lab 04/10/19 1125  WBC 4.9  HGB 8.2*  HCT 25.6*  PLT 207    Chemistries  Recent Labs  Lab 04/11/19 0544  NA 141  K 3.6  CL 99  CO2 36*  GLUCOSE 80  BUN 10  CREATININE 1.78*  CALCIUM 8.5*   Cardiac Enzymes No results for input(s): TROPONINI in the last 168 hours. RADIOLOGY:  No results found. ASSESSMENT AND PLAN:  [name] a 64 y.o.white femalewith end-stage renal disease on hemodialysis, atrial fibrillation, chronic diastolic congestive heart  failure, coronary artery disease status post CABG, diabetes mellitus type II, hypertension, hyperlipidemia, hypothyroidism, Covid positive in July 2020, recent nursing home stay in [place]. Prolonged hospitalization for MRSA bacteremia/sepsis and now with acute encephalopathy.  Acute metabolic encephalopathy:  -mental status improving slowly with patient saying a few words every now and then. Cannot hold meaningful conversation -MRI of brain and EEG negative. Patient remained afebrile with no leukocytosis. -B12 within normal range - IR  performed LP on 03/20/19 AM: CSF shows only 8 WBC, Protein elevated at 73, Glucose 83. Negative for Toxo. CSF Cx final -- negative.  - ID, Neurology assistance   -Overall long-term prognosis seems poor.  -Palliative care consulted -SLP eval recommended continue PEG tube feeding and aspiration precautions.  Reconsult once patient's mental status improves --PEG tube exchange out today  Sepsis secondary to MRSA bacteremia.   -  Right arm graft was removed on 03/04/2019. -Completed vancomycin for 6-week course on 04/01/2019.   Anemia of chronic illness.   -Status post transfusion 1 unit of PRBC on 03/17/2019 -  hemoglobin stable at 6.9---transfused on 04/07/19--9.5 -  Iron studies consistent with anemia of chronic illness.     -B12 within normal range -Continue EPO with dialysis.  Hypotension, chronic  Hypoalbuminemia  -on midodrine M-W-F -blood pressure currently stable  ESRD on HD (MWF).    -Currently using tunneled catheter as graft was removed due to MRSA bacteremia. -Continue with scheduled dialysis per nephrology -patient  has tolerated for our dialysis treatment sitting up.  Left arm DVT.   -Currently on Eliquis - recent diagnosis of left arm DVT on 03/03/2019.  Hypothyroidism.  Repeat TSH elevated at 17.82. -continue Increased dose of Synthroid 100. - repeat TSH in 2 to 3 weeks.  Paroxysmal A. Fib.. Currently in sinus. Amiodarone was  discontinued. -pt was on  Eliquis 5 mg p.o. twice daily, last dose on 04/08/2019 Held for 2 to 3 days for PEG tube insertion   Type 2 diabetes. -Continue SSI. -s/p Lantus 10 units daily, DC'd on 04/10/2019 due to hypoglycemic episodes   Chronic diastolic heart failure.  - Volume being managed by dialysis.  Bilateral decubitus ulcers.  Present on admission. -Continue with wound care. -Wound care nurse to follow sacral wound; consulted again on 03/24/19   Nutrition.  PEG placed 03/03/2019. PEG tube clogged on 2/2 or 2/3, exchanged new tube on 2/6. SLP eval done, recommended n.p.o., continue PEG tube feeding and reconsult once mental status improves.  Acute hypoxic respiratory failure.  Resolved.   Patient was intubated initially, extubated on 03/05/2019. -On room air now  DVT prophylaxis: Eliquis last done 2/3, held due to PEG tube insertion.  ON Lovenox ppx Code Status: DNR Family Communication:  Husband updated at bedside. Disposition Plan: rehab [facility] to d/c: pt apparently does not have medicare days left and husband cannot afford private pay. Challenging discharge disposition!   Enzo Bi M.D    [facility]   CC: Primary care physician; [name], MDPatient ID: Jamie Arias, female   DOB: [birth year], 64 y.o.   MRN: [id number]

## 2019-04-11 NOTE — Anesthesia Postprocedure Evaluation (Signed)
Anesthesia Post Note  Patient: Jamie Arias  Procedure(s) Performed: ESOPHAGOGASTRODUODENOSCOPY (EGD) (N/A )  Patient location during evaluation: PACU Anesthesia Type: General Level of consciousness: awake and alert Pain management: pain level controlled Vital Signs Assessment: post-procedure vital signs reviewed and stable Respiratory status: spontaneous breathing, nonlabored ventilation, respiratory function stable and patient connected to nasal cannula oxygen Cardiovascular status: blood pressure returned to baseline and stable Postop Assessment: no apparent nausea or vomiting Anesthetic complications: no     Last Vitals:  Vitals:   04/11/19 0440 04/11/19 1058  BP: (!) 156/65 (!) 128/47  Pulse: 85 73  Resp: 20 12  Temp: 37.1 C 36.6 C  SpO2: 92% 99%    Last Pain:  Vitals:   04/11/19 1058  TempSrc:   PainSc: 0-No pain                 Arita Miss

## 2019-04-11 NOTE — Transfer of Care (Signed)
Immediate Anesthesia Transfer of Care Note  Patient: Jamie Arias  Procedure(s) Performed: ESOPHAGOGASTRODUODENOSCOPY (EGD) (N/A )  Patient Location: PACU  Anesthesia Type:General  Level of Consciousness: awake and alert   Airway & Oxygen Therapy: Patient Spontanous Breathing  Post-op Assessment: Report given to RN and Post -op Vital signs reviewed and stable  Post vital signs: Reviewed and stable  Last Vitals:  Vitals Value Taken Time  BP 128/47 04/11/19 1058  Temp 36.6 C 04/11/19 1058  Pulse 73 04/11/19 1058  Resp 12 04/11/19 1058  SpO2 100 % 04/11/19 1058  Vitals shown include unvalidated device data.  Last Pain:  Vitals:   04/11/19 1058  TempSrc:   PainSc: 0-No pain      Patients Stated Pain Goal: 0 ([id number])  Complications: No apparent anesthesia complications

## 2019-04-11 NOTE — H&P (Signed)
Jonathon Bellows, MD [address] [place], [place], [place], [address] [address], [place], [place], [place], [address] Phone: [phone]  Fax: [phone]  Primary Care Physician:  Jamie Haven, MD   Pre-Procedure History & Physical: HPI:  Jamie Arias is a 64 y.o. female is here for an endoscopy    Past Medical History:  Diagnosis Date  . Anemia   . Chronic diastolic CHF (congestive heart failure) ([place])    a. Due to ischemic cardiomyopathy. EF as low as 35%, improved to normal s/p CABG; b. echo 07/06/13: EF 55-60%, no RWMAs, mod TR, trivial pericardial effusion not c/w tamponade physiology;  c. 10/2015 Echo: EF 65%, Gr1 DD, triv AI, mild MR, mildly dil LA, mod TR, PASP 44mHg.  .Marland KitchenCoronary artery disease    a. NSTEMI 06/2013; b.cath: severe three-vessel CAD w/ EF 30% & mild-mod MR; c. s/p 3 vessel CABG 07/02/13 (LIMA-LAD, SVG-OM, and SVG-RPDA);  d. 10/2015 MV: no ischemia/infarct.  . Diabetes mellitus without complication ([facility]   . Diabetic neuropathy ([place]   . Dialysis patient ([facility]    MWF  . ESRD (end stage renal disease) ([facility]    a. 12/2015 initiated - mwf dialysis.  .Marland KitchenGERD (gastroesophageal reflux disease)   . Hyperlipidemia   . Hypertension   . Hypothyroidism   . Myocardial infarction ([facility] 2015  . Neuropathy   . Pleural effusion 2015  . Pulmonary hypertension ([facility]   . Renal insufficiency   . Wears dentures    full lower    Past Surgical History:  Procedure Laterality Date  . A/V FISTULAGRAM Right 12/17/2016   Procedure: A/V Fistulagram;  Surgeon: DAlgernon Huxley MD;  [facility];  Service: Cardiovascular;  Laterality: Right;  . A/V FISTULAGRAM Right 01/07/2017   Procedure: A/V Fistulagram;  Surgeon: DAlgernon Huxley MD;  [facility];  Service: Cardiovascular;  Laterality: Right;  . A/V FISTULAGRAM Right 12/03/2017   Procedure: A/V FISTULAGRAM;  Surgeon: SKatha Cabal MD;  [facility];  Service:  Cardiovascular;  Laterality: Right;  . A/V FISTULAGRAM Right 02/23/2019   Procedure: A/V Fistulagram;  Surgeon: DAlgernon Huxley MD;  [facility];  Service: Cardiovascular;  Laterality: Right;  . A/V SHUNT INTERVENTION N/A 02/22/2017   Procedure: A/V SHUNT INTERVENTION;  Surgeon: DAlgernon Huxley MD;  [facility];  Service: Cardiovascular;  Laterality: N/A;  . AV FISTULA PLACEMENT Right 02/03/2016   Procedure: INSERTION OF ARTERIOVENOUS (AV) GORE-TEX GRAFT ARM ( BRACH / AXILLARY );  Surgeon: GKatha Cabal MD;  Location: [facility];  Service: Vascular;  Laterality: Right;  . [place]woodREMOVAL Right 03/04/2019   Procedure: REMOVAL OF ARTERIOVENOUS GORETEX GRAFT ([place];  Surgeon: DAlgernon Huxley MD;  Location: [facility];  Service: Vascular;  Laterality: Right;  . CARDIAC CATHETERIZATION    . CATARACT EXTRACTION Bilateral   . CHOLECYSTECTOMY N/A 12/09/2014   Procedure: LAPAROSCOPIC CHOLECYSTECTOMY;  Surgeon: CMarlyce [name] MD;  Location: [facility];  Service: General;  Laterality: N/A;  . CORONARY ARTERY BYPASS GRAFT N/A 07/02/2013   Procedure: CORONARY ARTERY BYPASS GRAFTING (CABG);  Surgeon: PIvin [name] MD;  Location: [facility]  Service: Open Heart Surgery;  Laterality: N/A;  CABG x three, using left internal mammary artery and right leg greater saphenous vein harvested endoscopically  . ESOPHAGOGASTRODUODENOSCOPY (EGD) WITH PROPOFOL N/A 11/24/2015   Procedure: ESOPHAGOGASTRODUODENOSCOPY (EGD) WITH PROPOFOL;  Surgeon: DLucilla [name] MD;  Location: [place];  Service: Endoscopy;  Laterality: N/A;  Diabetic - insulin  . EYE SURGERY Bilateral    Cataract Extraction with IOL  . INTRAOPERATIVE TRANSESOPHAGEAL ECHOCARDIOGRAM N/A 07/02/2013   Procedure: INTRAOPERATIVE TRANSESOPHAGEAL ECHOCARDIOGRAM;  Surgeon: Ivin [name], MD;  Location: [facility];  Service: Open Heart Surgery;  Laterality: N/A;  . PEG PLACEMENT N/A 03/03/2019   Procedure: PERCUTANEOUS ENDOSCOPIC  GASTROSTOMY (PEG) PLACEMENT;  Surgeon: Virgel Manifold, MD;  Location: [facility];  Service: Endoscopy;  Laterality: N/A;  . PERIPHERAL VASCULAR CATHETERIZATION Right 12/06/2015   Procedure: Dialysis/Perma Catheter Insertion;  Surgeon: Katha Cabal, MD;  Location: [place] [facility];  Service: Cardiovascular;  Laterality: Right;  . PERIPHERAL VASCULAR THROMBECTOMY Right 09/28/2016   Procedure: Peripheral Vascular Thrombectomy;  Surgeon: Katha Cabal, MD;  Location: [place] [facility];  Service: Cardiovascular;  Laterality: Right;  . PERIPHERAL VASCULAR THROMBECTOMY Right 05/20/2018   Procedure: PERIPHERAL VASCULAR THROMBECTOMY;  Surgeon: Katha Cabal, MD;  Location: [place] [facility] LAB;  Service: Cardiovascular;  Laterality: Right;  . PORTA CATH REMOVAL N/A 06/01/2016   Procedure: Glori [name] Cath Removal;  Surgeon: Katha Cabal, MD;  Location: [place] [facility];  Service: Cardiovascular;  Laterality: N/A;  . THORACENTESIS Left 2015    Prior to Admission medications   Medication Sig Start Date End Date Taking? Authorizing Provider  ACCU-CHEK FASTCLIX LANCETS MISC Check blood glucose twice daily, diagnosis code E11.9 12/13/16  Yes Jamie Haven, MD  acetaminophen (TYLENOL) 325 MG tablet Take 650 mg by mouth daily as needed for moderate pain or headache.    Yes [provider]  Alirocumab (PRALUENT) 75 MG/ML SOAJ Inject 75 mg into the skin every 14 (fourteen) days. 04/04/18  Yes Wellington Hampshire, MD  Amino Acids-Protein Hydrolys (FEEDING SUPPLEMENT, PRO-STAT SUGAR FREE 64,) LIQD Take 30 mLs by mouth 3 (three) times daily between meals. 09/09/18  Yes Hosie Poisson, MD  amiodarone (PACERONE) 200 MG tablet Take 200 mg by mouth daily.   Yes [provider]  aspirin EC 81 MG tablet Take 81 mg by mouth daily.   Yes [provider]  atorvastatin (LIPITOR) 40 MG tablet Take 40 mg by mouth daily.   Yes [provider]  Blood Glucose  Monitoring Suppl (ACCU-CHEK AVIVA PLUS) w/Device KIT Use as directed to test blood sugar up to three times daily 05/06/17  Yes Jamie Haven, MD  Calcium Acetate 667 MG TABS Take 2,001 mg by mouth 3 (three) times daily with meals.   Yes [provider]  ELIQUIS 5 MG TABS tablet Take 5 mg by mouth 2 (two) times daily.  01/20/19  Yes [provider]  ferrous sulfate 325 (65 FE) MG tablet Take 325 mg by mouth daily with breakfast.   Yes [provider]  FLUoxetine (PROZAC) 20 MG capsule Take 20 mg by mouth daily.   Yes [provider]  fluticasone (FLONASE) 50 MCG/ACT nasal spray Place 2 sprays into both nostrils daily. Patient taking differently: Place 2 sprays into both nostrils daily as needed for allergies or rhinitis.  09/17/17  Yes Jamie Haven, MD  furosemide (LASIX) 20 MG tablet Take 40 mg by mouth every other day. Non-dialysis days    Yes [provider]  gabapentin (NEURONTIN) 300 MG capsule Take 300 mg by mouth 2 (two) times daily.  01/20/19  Yes [provider]  glucose blood (ACCU-CHEK AVIVA) test strip Use as instructed to test blood sugar up to  3 times daily 05/06/17  Yes Jamie Haven, MD  Insulin Glargine (BASAGLAR KWIKPEN) 100 UNIT/ML SOPN Inject 18 Units into the skin daily.   Yes [provider]  Lancets (ACCU-CHEK SOFT TOUCH) lancets Use as instructed 05/06/17  Yes Jamie Haven, MD  levothyroxine (SYNTHROID) 88 MCG tablet Take 88 mcg by mouth daily before breakfast.   Yes [provider]  lidocaine-prilocaine (EMLA) cream Apply 1 application topically as needed (port access).   Yes [provider]  midodrine (PROAMATINE) 10 MG tablet Take 1 tablet (10 mg total) by mouth 3 (three) times daily with meals. Patient taking differently: Take 10 mg by mouth 3 (three) times daily as needed (low BP).  09/09/18  Yes Hosie Poisson, MD  multivitamin (RENA-VIT) TABS tablet Take 1 tablet by mouth at  bedtime. 09/09/18  Yes Hosie Poisson, MD  nitroGLYCERIN (NITROSTAT) 0.4 MG SL tablet DISSOLVE ONE TABLET UNDER THE TONGUE EVERY 5 MINUTES AS NEEDED FOR CHEST PAIN.  DO NOT EXCEED A TOTAL OF 3 DOSES IN 15 MINUTES Patient taking differently: Place 0.4 mg under the tongue every 5 (five) minutes as needed for chest pain. DISSOLVE ONE TABLET UNDER THE TONGUE EVERY 5 MINUTES AS NEEDED FOR CHEST PAIN.  DO NOT EXCEED A TOTAL OF 3 DOSES IN 15 MINUTES 08/18/18  Yes Wellington Hampshire, MD  ondansetron (ZOFRAN-ODT) 4 MG disintegrating tablet Take 1 tablet (4 mg total) by mouth every 8 (eight) hours as needed for nausea or vomiting. appt with PCP further refills Dr. Caryl Bis 01/28/19  Yes McLean-Scocuzza, Nino Glow, MD  pantoprazole (PROTONIX) 40 MG tablet Take 40 mg by mouth daily. 01/20/19  Yes [provider]  vitamin C (VITAMIN C) 500 MG tablet Take 1 tablet (500 mg total) by mouth daily. 09/10/18  Yes Hosie Poisson, MD  zinc sulfate 220 (50 Zn) MG capsule Take 1 capsule (220 mg total) by mouth daily. 09/10/18  Yes Hosie Poisson, MD  mirtazapine (REMERON) 15 MG tablet Take 1 tablet (15 mg total) by mouth at bedtime. Patient not taking: Reported on 02/09/2019 09/11/18   Mercy Riding, MD    Allergies as of 02/09/2019 - Review Complete 02/09/2019  Allergen Reaction Noted  . Nsaids Other (See Comments) 04/03/2016  . Doxycycline Other (See Comments) 05/14/2017    Family History  Problem Relation Age of Onset  . COPD Mother   . Cancer Mother        Lung  . Pulmonary embolism Father   . Diabetes Father   . Diabetes Paternal Grandfather   . Heart disease Maternal Grandmother   . Colon cancer Neg Hx   . Colon polyps Neg Hx   . Esophageal cancer Neg Hx   . Pancreatic cancer Neg Hx   . Liver disease Neg Hx     Social History   Socioeconomic History  . Marital status: Married    Spouse name: Not on file  . Number of children: Not on file  . Years of education: Not on file  . Highest education  level: Not on file  Occupational History  . Occupation: works for [employer]  Tobacco Use  . Smoking status: Never Smoker  . Smokeless tobacco: Never Used  Substance and Sexual Activity  . Alcohol use: No    Alcohol/week: 0.0 standard drinks  . Drug use: No  . Sexual activity: Not on file  Other Topics Concern  . Not on file  Social History Narrative   Patient lives at home with  her husband. Patient has 4 adult children.   Social Determinants of Health   Financial Resource Strain:   . Difficulty of Paying Living Expenses: Not on file  Food Insecurity:   . Worried About Charity fundraiser in the Last Year: Not on file  . Ran Out of Food in the Last Year: Not on file  Transportation Needs:   . Lack of Transportation (Medical): Not on file  . Lack of Transportation (Non-Medical): Not on file  Physical Activity:   . Days of Exercise per Week: Not on file  . Minutes of Exercise per Session: Not on file  Stress:   . Feeling of Stress : Not on file  Social Connections:   . Frequency of Communication with Friends and Family: Not on file  . Frequency of Social Gatherings with Friends and Family: Not on file  . Attends Religious Services: Not on file  . Active Member of Clubs or Organizations: Not on file  . Attends Archivist Meetings: Not on file  . Marital Status: Not on file  Intimate Partner Violence:   . Fear of Current or Ex-Partner: Not on file  . Emotionally Abused: Not on file  . Physically Abused: Not on file  . Sexually Abused: Not on file    Review of Systems: See HPI, otherwise negative ROS  Physical Exam: BP (!) 156/65 (BP Location: Right Leg)   Pulse 85   Temp 98.7 F (37.1 C) (Oral)   Resp 20   Ht 5' 4.02" (1.626 m)   Wt 84.6 kg   SpO2 92%   BMI 32.00 kg/m  General:   Alert,  pleasant and cooperative in NAD Head:  Normocephalic and atraumatic. Neck:  Supple; no masses or thyromegaly. Lungs:  Clear throughout to auscultation,  normal respiratory effort.    Heart:  +S1, +S2, Regular rate and rhythm, No edema. Abdomen:  Soft, nontender and nondistended. Normal bowel sounds, without guarding, and without rebound.   Neurologic:  Alert and  oriented x4;  grossly normal neurologically.  Impression/Plan: Jamie Arias is here for an endoscopy  to be performed for  evaluation of nausea and peg change    Risks, benefits, limitations, and alternatives regarding endoscopy have been reviewed with the patient.  Questions have been answered.  All parties agreeable.   Jonathon Bellows, MD  04/11/2019, 10:34 AM

## 2019-04-11 NOTE — Anesthesia Preprocedure Evaluation (Addendum)
Anesthesia Evaluation  Patient identified by MRN, date of birth, ID band Patient awake and Patient confused  General Assessment Comment:Patient AO x 1-2. Knows her name, doesn't know year , knows she is in a hospital but doesn't know which one  Reviewed: Allergy & Precautions, NPO status , Patient's Chart, lab work & pertinent test results  History of Anesthesia Complications Negative for: history of anesthetic complications  Airway Mallampati: III  TM Distance: >3 FB Neck ROM: Full    Dental no notable dental hx. (+) Edentulous Upper, Edentulous Lower   Pulmonary neg pulmonary ROS, neg sleep apnea, neg COPD, Patient abstained from smoking.Not current smoker,    Pulmonary exam normal breath sounds clear to auscultation       Cardiovascular Exercise Tolerance: Poor METShypertension, + angina + CAD, + Past MI and +CHF  (-) dysrhythmias  Rhythm:Regular Rate:Normal - Systolic murmurs TTE: 1. Left ventricular ejection fraction, by visual estimation, is 60 to  65%. The left ventricle has normal function. There is mildly increased  left ventricular hypertrophy.  2. Elevated left atrial pressure.  3. Left ventricular diastolic parameters are consistent with Grade II  diastolic dysfunction (pseudonormalization).  4. The left ventricle has no regional wall motion abnormalities.  5. Global right ventricle has mildly reduced systolic function.The right  ventricular size is mildly enlarged. Mildly increased right ventricular  wall thickness.  6. Left atrial size was normal.  7. Right atrial size was normal.  8. Mild mitral annular calcification.  9. The mitral valve is normal in structure. Trace mitral valve  regurgitation. No evidence of mitral stenosis.  10. The tricuspid valve is normal in structure. Tricuspid valve  regurgitation moderate-severe.  11. The aortic valve is tricuspid. Aortic valve regurgitation is trivial.  Mild  to moderate aortic valve sclerosis/calcification without any evidence  of aortic stenosis.  12. The pulmonic valve was grossly normal. Pulmonic valve regurgitation is  trivial.  13. Mildly elevated pulmonary artery systolic pressure.  14. The inferior vena cava is normal in size with <50% respiratory  variability, suggesting right atrial pressure of 8 mmHg.    Neuro/Psych PSYCHIATRIC DISORDERS Depression Metabolic encephalopathy negative psych ROS   GI/Hepatic GERD  ,(+)     (-) substance abuse  ,   Endo/Other  diabetesHypothyroidism   Renal/GU CRFRenal diseasenegative Renal ROS     Musculoskeletal   Abdominal   Peds  Hematology   Anesthesia Other Findings Past Medical History: No date: Anemia No date: Chronic diastolic CHF (congestive heart failure) ([facility])     Comment:  a. Due to ischemic cardiomyopathy. EF as low as 35%,               improved to normal s/p CABG; b. echo 07/06/13: EF 55-60%,               no RWMAs, mod TR, trivial pericardial effusion not c/w               tamponade physiology;  c. 10/2015 Echo: EF 65%, Gr1 DD,               triv AI, mild MR, mildly dil LA, mod TR, PASP 49mmHg. No date: Coronary artery disease     Comment:  a. NSTEMI 06/2013; b.cath: severe three-vessel CAD w/ EF               30% & mild-mod MR; c. s/p 3 vessel CABG 07/02/13               (  LIMA-LAD, SVG-OM, and SVG-RPDA);  d. 10/2015 MV: no               ischemia/infarct. No date: Diabetes mellitus without complication (H[facility]) No date: Diabetic neuropathy ([place]) No date: Dialysis patient [facility])     Comment:  MWF No date: ESRD (end stage renal disease) ([place])     Comment:  a. 12/2015 initiated - mwf dialysis. No date: GERD (gastroesophageal reflux disease) No date: Hyperlipidemia No date: Hypertension No date: Hypothyroidism 2015: Myocardial infarction ([place]) No date: Neuropathy 2015: Pleural effusion No date: Pulmonary hypertension (HCC) No date: Renal insufficiency No date: Wears  dentures     Comment:  full lower  Reproductive/Obstetrics                            Anesthesia Physical Anesthesia Plan  ASA: III  Anesthesia Plan: General   Post-op Pain Management:    Induction: Intravenous  PONV Risk Score and Plan: 3 and Ondansetron, Dexamethasone and Propofol infusion  Airway Management Planned: Natural Airway and Nasal Cannula  Additional Equipment: None  Intra-op Plan:   Post-operative Plan:   Informed Consent: I have reviewed the patients History and Physical, chart, labs and discussed the procedure including the risks, benefits and alternatives for the proposed anesthesia with the patient or authorized representative who has indicated his/her understanding and acceptance.   Patient has DNR.  Discussed DNR with power of attorney and Suspend DNR.   Dental advisory given  Plan Discussed with: CRNA and Surgeon  Anesthesia Plan Comments: (Discussed risks of anesthesia with spouse, including PONV, sore throat, lip/dental damage. Rare risks discussed as well, such as cardiorespiratory sequelae. Spouse understands.)        Anesthesia Quick Evaluation

## 2019-04-11 NOTE — Progress Notes (Signed)
Discussed with Dr. Billie Ruddy if we could discontinue patients IVF since she has the PEG tube again.

## 2019-04-12 LAB — RENAL FUNCTION PANEL
Albumin: 2.3 g/dL — ABNORMAL LOW (ref 3.5–5.0)
Anion gap: 8 (ref 5–15)
BUN: 17 mg/dL (ref 8–23)
CO2: 32 mmol/L (ref 22–32)
Calcium: 8.7 mg/dL — ABNORMAL LOW (ref 8.9–10.3)
Chloride: 98 mmol/L (ref 98–111)
Creatinine, Ser: 2.9 mg/dL — ABNORMAL HIGH (ref 0.44–1.00)
GFR calc Af Amer: 19 mL/min — ABNORMAL LOW (ref >60)
GFR calc non Af Amer: 16 mL/min — ABNORMAL LOW (ref >60)
Glucose, Bld: 168 mg/dL — ABNORMAL HIGH (ref 70–99)
Phosphorus: 4.3 mg/dL (ref 2.5–4.6)
Potassium: 3.9 mmol/L (ref 3.5–5.1)
Sodium: 138 mmol/L (ref 135–145)

## 2019-04-12 LAB — GLUCOSE, CAPILLARY
Glucose-Capillary: 145 mg/dL — ABNORMAL HIGH (ref 70–99)
Glucose-Capillary: 156 mg/dL — ABNORMAL HIGH (ref 70–99)
Glucose-Capillary: 151 mg/dL — ABNORMAL HIGH (ref 70–99)
Glucose-Capillary: 63 mg/dL — ABNORMAL LOW (ref 70–99)
Glucose-Capillary: 125 mg/dL — ABNORMAL HIGH (ref 70–99)
Glucose-Capillary: 131 mg/dL — ABNORMAL HIGH (ref 70–99)

## 2019-04-12 LAB — CBC
HCT: 30.5 % — ABNORMAL LOW (ref 36.0–46.0)
Hemoglobin: 9.5 g/dL — ABNORMAL LOW (ref 12.0–15.0)
MCH: 30.9 pg (ref 26.0–34.0)
MCHC: 31.1 g/dL (ref 30.0–36.0)
MCV: 99.3 fL (ref 80.0–100.0)
Platelets: 217 10*3/uL (ref 150–400)
RBC: 3.07 MIL/uL — ABNORMAL LOW (ref 3.87–5.11)
RDW: 16.9 % — ABNORMAL HIGH (ref 11.5–15.5)
WBC: 4.6 10*3/uL (ref 4.0–10.5)
nRBC: 0 % (ref 0.0–0.2)

## 2019-04-12 MED ORDER — SENNOSIDES-DOCUSATE SODIUM 8.6-50 MG PO TABS
1.0000 | ORAL_TABLET | Freq: Two times a day (BID) | ORAL | Status: DC
  Administered 2019-04-12 – 2019-04-16 (×7): 1 via ORAL
  Filled 2019-04-12 (×7): qty 1

## 2019-04-12 MED ORDER — POLYETHYLENE GLYCOL 3350 17 G PO PACK
17.0000 g | PACK | Freq: Two times a day (BID) | ORAL | Status: DC
  Administered 2019-04-12 – 2019-04-15 (×3): 17 g via ORAL
  Filled 2019-04-12 (×4): qty 1

## 2019-04-12 MED ORDER — DEXTROSE 50 % IV SOLN
12.5000 g | Freq: Once | INTRAVENOUS | Status: AC
  Administered 2019-04-12: 12.5 g via INTRAVENOUS
  Filled 2019-04-12: qty 50

## 2019-04-12 NOTE — Progress Notes (Signed)
[place], [place] 04/12/19  Subjective:   Hospital day # 62  Doing fair.  No acute events reported by nursing staff.   Able to answer few simple questions.  Her husband is at bedside. Tube feeds continued at 40 cc/h  Renal: 02/06 0701 - 02/07 0700 In: 359.1 [I.V.:359.1] Out: -      Objective:  Vital signs in last 24 hours:  Temp:  [98.6 F (37 C)-99.1 F (37.3 C)] 98.6 F (37 C) (02/07 0420) Pulse Rate:  [77-86] 84 (02/07 0645) Resp:  [16-20] 16 (02/07 0420) BP: (156-178)/(54-72) 171/54 (02/07 0645) SpO2:  [95 %-97 %] 95 % (02/07 0420)  Weight change:  Filed Weights   03/27/19 1045 03/31/19 1220 04/07/19 1700  Weight: 81 kg 83.5 kg 84.6 kg    Intake/Output:    Intake/Output Summary (Last 24 hours) at 04/12/2019 1302 Last data filed at 04/11/2019 1606 Gross per 24 hour  Intake 259.08 ml  Output --  Net 259.08 ml     Physical Exam: General:  Chronically ill-appearing, laying in the bed  HEENT  anicteric, moist oral mucous membranes  Pulm/lungs  shallow breathing effort, no crackles  CVS/Heart  irregular rhythm, no rub  Abdomen:   Soft, nontender, PEG tube in place  Extremities:  + dependent edema  Neurologic:  Alert, oriented to self, able to answer simple questions  Skin:  scattered Ecchymosis  Access:  PermCath Left IJ       Basic Metabolic Panel:  Recent Labs  Lab 04/08/19 1201 04/08/19 1201 04/09/19 0742 04/09/19 0742 04/10/19 1125 04/11/19 0544 04/12/19 0943  NA 141  --  141  --  138 141 138  K 3.8  --  4.4  --  3.6 3.6 3.9  CL 99  --  96*  --  97* 99 98  CO2 34*  --  33*  --  33* 36* 32  GLUCOSE 88  --  90  --  98 80 168*  BUN 22  --  19  --  19 10 17   CREATININE 1.74*  --  1.98*  --  2.11* 1.78* 2.90*  CALCIUM 8.3*   < > 9.1   < > 8.1* 8.5* 8.7*  PHOS 2.9  --  3.7  --  3.5 3.5 4.3   < > = values in this interval not displayed.     CBC: Recent Labs  Lab 04/06/19 0500 04/07/19 0538 04/08/19 1201  04/10/19 1125 04/12/19 0943  WBC 5.2 5.6 5.1 4.9 4.6  HGB 6.9* 9.5* 8.7* 8.2* 9.5*  HCT 22.5* 29.9* 27.0* 25.6* 30.5*  MCV 100.4* 96.5 96.4 98.1 99.3  PLT 248 257 243 207 217      Lab Results  Component Value Date   HEPBSAG NON REACTIVE 03/18/2019      Microbiology:  Recent Results (from the past 240 hour(s))  MRSA PCR Screening     Status: None   Collection Time: 04/03/19  8:15 PM   Specimen: Nasopharyngeal  Result Value Ref Range Status   MRSA by PCR NEGATIVE NEGATIVE Final    Comment:        The GeneXpert MRSA Assay (FDA approved for NASAL specimens only), is one component of a comprehensive MRSA colonization surveillance program. It is not intended to diagnose MRSA infection nor to guide or monitor treatment for MRSA infections. Performed at [facility], [address] [place], [place] [address]     Coagulation Studies: No results for input(s): LABPROT, INR  in the last 72 hours.  Urinalysis: No results for input(s): COLORURINE, LABSPEC, PHURINE, GLUCOSEU, HGBUR, BILIRUBINUR, KETONESUR, PROTEINUR, UROBILINOGEN, NITRITE, LEUKOCYTESUR in the last 72 hours.  Invalid input(s): APPERANCEUR    Imaging: No results found.   Medications:   . sodium chloride 20 mL (03/23/19 1852)  . feeding supplement (OSMOLITE 1.5 CAL) 1,000 mL (04/11/19 1200)   . acidophilus  2 capsule Oral BID  . alteplase  2 mg Intracatheter Once  . alteplase  2 mg Intracatheter Once  . apixaban  5 mg Oral BID  . aspirin  81 mg Per Tube Daily  . B-complex with vitamin C  1 tablet Per Tube Daily  . Chlorhexidine Gluconate Cloth  6 each Topical Q0600  . epoetin (EPOGEN/PROCRIT) injection  10,000 Units Intravenous Q M,W,F-HD  . feeding supplement (PRO-STAT SUGAR FREE 64)  30 mL Per Tube Daily  . [START ON 04/17/2019] ferrous gluconate  324 mg Oral Daily  . fluticasone  2 spray Each Nare Daily  . folic acid  1 mg Intravenous Daily  . free water  20 mL Per Tube Q4H  . insulin  aspart  0-15 Units Subcutaneous Q4H  . levothyroxine  88 mcg Per Tube Q0600  . mouth rinse  15 mL Mouth Rinse BID  . metoCLOPramide (REGLAN) injection  10 mg Intravenous Q6H  . pantoprazole (PROTONIX) IV  40 mg Intravenous Q12H   sodium chloride, acetaminophen, ipratropium-albuterol, lip balm, morphine injection, naphazoline-glycerin, ondansetron (ZOFRAN) IV, promethazine, promethazine, sodium chloride, traMADol  Assessment/ Plan:  64 y.o. female with  end-stage renal disease on hemodialysis, atrial fibrillation, chronic diastolic congestive heart failure, coronary artery disease status post CABG, diabetes mellitus type II, hypertension, hyperlipidemia, hypothyroidism, Covid positive in July 2020, recent nursing home stay in [place], Prolonged hospitalization for MRSA bacteremia/sepsis  admitted on 02/09/2019 for ESRD (end stage renal disease) ([place]) [N18.6] Altered mental status, unspecified altered mental status type [R41.82] Chest pain, unspecified type [R07.9] Sepsis, due to unspecified organism, unspecified whether acute organ dysfunction present ([place]) [A41.9]  [facility] MWF [facility]   #End-stage renal disease Continue hemodialysis via PermCath on MWF schedule Dialysis in chair Fluid removal as tolerated Next dialysis planned for Monday  #Acute encephalopathy Slow improvement Able to answer a few simple questions  #MRSA sepsis Right arm AV graft removed this admission on March 04, 2019  #Anemia of chronic kidney disease Epogen scheduled with dialysis   iron tab with Peg tube Lab Results  Component Value Date   HGB 9.5 (L) 04/12/2019  Continue now Epogen 10,000 units IV MWF with dialysis  #Secondary hyperparathyroidism Lab Results  Component Value Date   PTH 135 (H) 02/22/2017   CALCIUM 8.7 (L) 04/12/2019   CAION 1.20 07/03/2013   PHOS 4.3 04/12/2019  not on binders at present Tube feeds Osmolite 1.5 Cal Feeding supplements prostat 30 mL daily    LOS:  [place] 2/7/20211:02 PM  [place], [place]  Note: This note was prepared with Dragon dictation. Any transcription errors are unintentional

## 2019-04-12 NOTE — Progress Notes (Signed)
[facility] [place] NAME: Jamie Arias    MR#:  [id number]  DATE OF BIRTH:  05/10/1955  SUBJECTIVE:  Not much change.  Pt very slowly becoming more responsive.  Has not had BM yet.  No fever, N/V/D, increased swelling.  Order Miralax BID.   REVIEW OF SYSTEMS:   Review of Systems  Unable to perform ROS: Mental acuity   Tolerating Diet: PEG feeding Tolerating PT: not able to participate fully yet  DRUG ALLERGIES:   Allergies  Allergen Reactions  . Nsaids Other (See Comments)    Contraindicated due to kidney disease.  Marland Kitchen Doxycycline Other (See Comments)    tremor    VITALS:  Blood pressure (!) 159/72, pulse 76, temperature 98.7 F (37.1 C), temperature source Oral, resp. rate 16, height 5' 4.02" (1.626 m), weight 84.6 kg, SpO2 96 %.  PHYSICAL EXAMINATION:   Physical Exam limited due to pt participation and MS changes  Constitutional: NAD, awake, more expression in her face today, able to respond to simple questions HEENT: conjunctivae and lids normal, EOMI HD cath+ CV: RRR 2+ systolic murmur at upper sternal boarders. Distal pulses +2.  No cyanosis.   RESP: CTA B/L over anterior, normal respiratory effort, on RA GI: +BS, NTND, PEG tube in place Extremities: bilateral feet in soft boots SKIN: warm, dry, extensive bruising   LABORATORY PANEL:  CBC Recent Labs  Lab 04/12/19 0943  WBC 4.6  HGB 9.5*  HCT 30.5*  PLT 217    Chemistries  Recent Labs  Lab 04/12/19 0943  NA 138  K 3.9  CL 98  CO2 32  GLUCOSE 168*  BUN 17  CREATININE 2.90*  CALCIUM 8.7*   Cardiac Enzymes No results for input(s): TROPONINI in the last 168 hours. RADIOLOGY:  No results found. ASSESSMENT AND PLAN:  JamieJamie F Kinneyis a 64 y.o.white femalewith end-stage renal disease on hemodialysis, atrial fibrillation, chronic diastolic congestive heart failure, coronary artery disease status post CABG, diabetes mellitus type II, hypertension,  hyperlipidemia, hypothyroidism, Covid positive in July 2020, recent nursing home stay in [place]. Prolonged hospitalization for MRSA bacteremia/sepsis and now with acute encephalopathy.  Acute metabolic encephalopathy:  -mental status improving slowly with patient saying a few words every now and then. Cannot hold meaningful conversation -MRI of brain and EEG negative. Patient remained afebrile with no leukocytosis. -B12 within normal range - IR  performed LP on 03/20/19 AM: CSF shows only 8 WBC, Protein elevated at 73, Glucose 83. Negative for Toxo. CSF Cx final -- negative.  - ID, Neurology assistance   -Overall long-term prognosis seems poor.  -Palliative care consulted -SLP eval recommended continue PEG tube feeding and aspiration precautions.  Reconsult once patient's mental status improves --PEG tube exchange out today  Sepsis secondary to MRSA bacteremia.   -  Right arm graft was removed on 03/04/2019. -Completed vancomycin for 6-week course on 04/01/2019.   Anemia of chronic illness.   -Status post transfusion 1 unit of PRBC on 03/17/2019 -  hemoglobin stable at 6.9---transfused on 04/07/19--9.5 -  Iron studies consistent with anemia of chronic illness.     -B12 within normal range -Continue EPO with dialysis.  Hypotension, chronic  Hypoalbuminemia  -on midodrine M-W-F -blood pressure currently stable  ESRD on HD (MWF).    -Currently using tunneled catheter as graft was removed due to MRSA bacteremia. -Continue with scheduled dialysis per nephrology -patient has tolerated for our dialysis treatment sitting up.  Left arm  DVT.   -Currently on Eliquis - recent diagnosis of left arm DVT on 03/03/2019.  Hypothyroidism.  Repeat TSH elevated at 17.82. -continue Increased dose of Synthroid 100. - repeat TSH in 2 to 3 weeks.  Paroxysmal A. Fib.. Currently in sinus. Amiodarone was discontinued. -eliquis resumed after PEG tube exchange.  Type 2 diabetes. -Continue  SSI. -s/p Lantus 10 units daily, DC'd on 04/10/2019 due to hypoglycemic episodes   Chronic diastolic heart failure.  - Volume being managed by dialysis.  Bilateral decubitus ulcers.  Present on admission. -Continue with wound care. -Wound care nurse to follow sacral wound; consulted again on 03/24/19   Nutrition.  PEG placed 03/03/2019. PEG tube clogged on 2/2 or 2/3, exchanged new tube on 2/6. SLP eval done, recommended n.p.o., continue PEG tube feeding and reconsult once mental status improves.  Acute hypoxic respiratory failure.  Resolved.   Patient was intubated initially, extubated on 03/05/2019. -On room air now  DVT prophylaxis: Eliquis Code Status: DNR Family Communication:  Husband updated at bedside. Disposition Plan: rehab Barrier to d/c: pt apparently does not have medicare days left and husband cannot afford private pay. Challenging discharge disposition!   Enzo Bi M.D    [facility]   CC: Primary care physician; Jamie Arias, MDPatient ID: Jamie Arias, female   DOB: 02-25-1956, 64 y.o.   MRN: [id number]

## 2019-04-13 ENCOUNTER — Encounter: Payer: Self-pay | Admitting: *Deleted

## 2019-04-13 LAB — RENAL FUNCTION PANEL
Albumin: 2.2 g/dL — ABNORMAL LOW (ref 3.5–5.0)
Anion gap: 9 (ref 5–15)
BUN: 25 mg/dL — ABNORMAL HIGH (ref 8–23)
CO2: 31 mmol/L (ref 22–32)
Calcium: 8.4 mg/dL — ABNORMAL LOW (ref 8.9–10.3)
Chloride: 96 mmol/L — ABNORMAL LOW (ref 98–111)
Creatinine, Ser: 3.16 mg/dL — ABNORMAL HIGH (ref 0.44–1.00)
GFR calc Af Amer: 17 mL/min — ABNORMAL LOW (ref >60)
GFR calc non Af Amer: 15 mL/min — ABNORMAL LOW (ref >60)
Glucose, Bld: 139 mg/dL — ABNORMAL HIGH (ref 70–99)
Phosphorus: 4.4 mg/dL (ref 2.5–4.6)
Potassium: 4.1 mmol/L (ref 3.5–5.1)
Sodium: 136 mmol/L (ref 135–145)

## 2019-04-13 LAB — GLUCOSE, CAPILLARY
Glucose-Capillary: 120 mg/dL — ABNORMAL HIGH (ref 70–99)
Glucose-Capillary: 128 mg/dL — ABNORMAL HIGH (ref 70–99)
Glucose-Capillary: 129 mg/dL — ABNORMAL HIGH (ref 70–99)
Glucose-Capillary: 130 mg/dL — ABNORMAL HIGH (ref 70–99)
Glucose-Capillary: 153 mg/dL — ABNORMAL HIGH (ref 70–99)
Glucose-Capillary: 53 mg/dL — ABNORMAL LOW (ref 70–99)
Glucose-Capillary: 92 mg/dL (ref 70–99)
Glucose-Capillary: 96 mg/dL (ref 70–99)

## 2019-04-13 LAB — CBC
HCT: 27.8 % — ABNORMAL LOW (ref 36.0–46.0)
Hemoglobin: 8.7 g/dL — ABNORMAL LOW (ref 12.0–15.0)
MCH: 31 pg (ref 26.0–34.0)
MCHC: 31.3 g/dL (ref 30.0–36.0)
MCV: 98.9 fL (ref 80.0–100.0)
Platelets: 212 10*3/uL (ref 150–400)
RBC: 2.81 MIL/uL — ABNORMAL LOW (ref 3.87–5.11)
RDW: 16.7 % — ABNORMAL HIGH (ref 11.5–15.5)
WBC: 4.9 10*3/uL (ref 4.0–10.5)
nRBC: 0 % (ref 0.0–0.2)

## 2019-04-13 MED ORDER — DEXTROSE 50 % IV SOLN
12.5000 g | Freq: Once | INTRAVENOUS | Status: AC
  Administered 2019-04-13: 12.5 g via INTRAVENOUS
  Filled 2019-04-13: qty 50

## 2019-04-13 MED ORDER — OSMOLITE 1.5 CAL PO LIQD
1000.0000 mL | ORAL | Status: DC
  Administered 2019-04-13 – 2019-04-15 (×4): 1000 mL

## 2019-04-13 NOTE — Care Management Important Message (Signed)
Important Message  Patient Details  Name: Jamie Arias MRN: [id number] Date of Birth: 11-13-1955   Medicare Important Message Given:  Yes     Dannette Barbara 04/13/2019, 11:25 AM

## 2019-04-13 NOTE — Progress Notes (Signed)
Nutrition Follow-up  DOCUMENTATION CODES:   Obesity unspecified  INTERVENTION:   Continue Osmolite 1.5 @50ml/hr + prostat liquid protein 30 ml daily via tube, supplement provides 100 kcal, 15 grams protein.  Free water flushes 20ml q4 hours to maintain tube patency   Regimen provides 1900kcal/day, 90g/day protein, 1094ml/day free water.   B-complex with C daily via tube  Folic acid 1mg daily via tube  Risaquad per tube BID  NUTRITION DIAGNOSIS:   Increased nutrient needs related to chronic illness(ESRD on HD) as evidenced by estimated needs. Ongoing.  GOAL:   Patient will meet greater than or equal to 90% of their needs -met with tube feeds   MONITOR:   Labs, Weight trends, TF tolerance, Skin, I & O's  ASSESSMENT:   63 y.o. female with end-stage renal disease on HD, atrial fibrillation, chronic diastolic congestive heart failure, CAD status post PCI, CABG,  type 2 diabetes, hypertension, hyperlipidemia, hypothyroidism, Covid positive in July 2020 admitted with AMS and found to have MRSA positive blood cultures   Pt s/p PEG tube exchange 2/6  Pt tolerating tube feeds well at 40ml/hr via PEG; will increase back to goal rate of 50ml/hr. Pt noted to have type 7 stool today. Fiber supplements were discontinued r/t patient complaining of nausea; we may need to restart these if diarrhea persists. Pt also receiving miralax and senokot. Pt in HD this morning. Pt's weight is trending back down; pt is now up ~11lbs since admit.   Medications reviewed and include: riasquad, aspirin, B complex with C, ferrous gluconate, epogen, insulin, synthroid, protonix, reglan, miralax, senokot   Labs reviewed: BUN 25(H), creat 3.16(H)- 1/17, P 4.4 wn Hgb 8.7(L), Hct 27.8(L) cbgs- 120, 153, 128 x 24 hrs Folate- 4.5(L)- 1/14  Diet Order:   Diet Order            Diet NPO time specified  Diet effective now             EDUCATION NEEDS:   Not appropriate for education at this  time  Skin:  Skin Assessment: Skin Integrity Issues:(stg II right buttocks; DTI right heel; unstageable left heel)  Last BM:  2/8- type 7  Height:   Ht Readings from Last 1 Encounters:  03/04/19 5' 4.02" (1.626 m)   Weight:   Wt Readings from Last 1 Encounters:  04/07/19 84.6 kg   Ideal Body Weight:  45 kg  BMI:  Body mass index is 32 kg/m.  Estimated Nutritional Needs:   Kcal:  1700-1900kcal/day  Protein:  85-95g/day  Fluid:  UOP +1L    MS, RD, LDN Pager #- [phone] Office#- [phone] After Hours Pager: [phone] 

## 2019-04-13 NOTE — Progress Notes (Signed)
PT Cancellation Note  Patient Details Name: Jamie Arias MRN: [id number] DOB: 1956-01-16   Cancelled Treatment:    Reason Eval/Treat Not Completed: Patient at procedure or test/unavailable.  Pt currently off unit at dialysis.  Will re-attempt PT treatment session at a later date/time.  Leitha Bleak, PT 04/13/19, 9:00 AM

## 2019-04-13 NOTE — Progress Notes (Signed)
Post HD [facility]    04/13/19 1133  Hand-Off documentation  Report given to (Full Name) Alveria Apley, RN   Report received from (Full Name) Beatris Ship  Vital Signs  Temp 97.8 F (36.6 C)  Temp Source Oral  Pulse Rate 67  Pulse Rate Source Monitor  Resp 11  BP (!) 160/46  BP Location Left Leg  BP Method Automatic  Patient Position (if appropriate) Sitting  Oxygen Therapy  SpO2 100 %  O2 Device Room Air  Pulse Oximetry Type Continuous  Pain Assessment  Pain Scale 0-10  Pain Score 0  Post-Hemodialysis Assessment  Rinseback Volume (mL) 250 mL  KECN 66.9 V  Dialyzer Clearance Lightly streaked  Duration of HD Treatment -hour(s) 3 hour(s)  Hemodialysis Intake (mL) 500 mL  UF Total -Machine (mL) 2500 mL  Net UF (mL) 2000 mL  Hemodialysis Catheter Left Internal jugular Double lumen Permanent (Tunneled)  Placement Date/Time: 02/23/19 1514   Time Out: Correct patient;Correct site;Correct procedure  Maximum sterile barrier precautions: Hand hygiene;Cap;Mask;Sterile gown;Sterile gloves;Large sterile sheet  Site Prep: Chlorhexidine (preferred)  Local Anes...  Site Condition No complications  Blue Lumen Status Heparin locked  Red Lumen Status Heparin locked  Post treatment catheter status Capped and Clamped

## 2019-04-13 NOTE — Progress Notes (Signed)
[facility] [place] NAME: Jamie Arias    MR#:  [id number]  DATE OF BIRTH:  05-Jan-1956  SUBJECTIVE:  Had BM x2 yesterday in response to Miralax.  Pt had no complaints today.  No fever,  N/V/D, increased swelling.    REVIEW OF SYSTEMS:   Review of Systems  Unable to perform ROS: Mental acuity   Tolerating Diet: PEG feeding Tolerating PT: not able to participate fully yet  DRUG ALLERGIES:   Allergies  Allergen Reactions  . Nsaids Other (See Comments)    Contraindicated due to kidney disease.  Marland Kitchen Doxycycline Other (See Comments)    tremor    VITALS:  Blood pressure (!) 149/70, pulse 69, temperature 98 F (36.7 C), temperature source Oral, resp. rate 18, height 5' 4.02" (1.626 m), weight 84.6 kg, SpO2 99 %.  PHYSICAL EXAMINATION:   Physical Exam limited due to pt participation and MS changes  Constitutional: NAD, awake, more expression in her face, able to respond to simple questions HEENT: conjunctivae and lids normal, EOMI HD cath+ CV: RRR 2+ systolic murmur at upper sternal boarders. Distal pulses +2.  No cyanosis.   RESP: CTA B/L over anterior, normal respiratory effort, on RA GI: +BS, NTND, PEG tube in place Extremities: bilateral feet in soft boots SKIN: warm, dry, extensive bruising   LABORATORY PANEL:  CBC Recent Labs  Lab 04/13/19 0220  WBC 4.9  HGB 8.7*  HCT 27.8*  PLT 212    Chemistries  Recent Labs  Lab 04/13/19 0220  NA 136  K 4.1  CL 96*  CO2 31  GLUCOSE 139*  BUN 25*  CREATININE 3.16*  CALCIUM 8.4*   Cardiac Enzymes No results for input(s): TROPONINI in the last 168 hours. RADIOLOGY:  No results found. ASSESSMENT AND PLAN:  Jamie Arias a 64 y.o.white femalewith end-stage renal disease on hemodialysis, atrial fibrillation, chronic diastolic congestive heart failure, coronary artery disease status post CABG, diabetes mellitus type II, hypertension, hyperlipidemia,  hypothyroidism, Covid positive in July 2020, recent nursing home stay in [place]. Prolonged hospitalization for MRSA bacteremia/sepsis and now with acute encephalopathy.  Acute metabolic encephalopathy:  -mental status improving slowly with patient saying a few words every now and then. Cannot hold meaningful conversation -MRI of brain and EEG negative. Patient remained afebrile with no leukocytosis. -B12 within normal range - IR  performed LP on 03/20/19 AM: CSF shows only 8 WBC, Protein elevated at 73, Glucose 83. Negative for Toxo. CSF Cx final -- negative.  - ID, Neurology assistance   -Overall long-term prognosis seems poor.  -Palliative care consulted -SLP eval recommended continue PEG tube feeding and aspiration precautions.  Reconsult once patient's mental status improves --PEG tube exchange out today  Sepsis secondary to MRSA bacteremia.   -  Right arm graft was removed on 03/04/2019. -Completed vancomycin for 6-week course on 04/01/2019.   Anemia of chronic illness.   -Status post transfusion 1 unit of PRBC on 03/17/2019 -  hemoglobin stable at 6.9---transfused on 04/07/19--9.5 -  Iron studies consistent with anemia of chronic illness.     -B12 within normal range -Continue EPO with dialysis.  Hypotension, chronic  Hypoalbuminemia  -on midodrine M-W-F -blood pressure currently stable  ESRD on HD (MWF).    -Currently using tunneled catheter as graft was removed due to MRSA bacteremia. -Continue with scheduled dialysis per nephrology -patient has tolerated for our dialysis treatment sitting up.  Left arm DVT.   -Currently on  Eliquis - recent diagnosis of left arm DVT on 03/03/2019.  Hypothyroidism.  Repeat TSH elevated at 17.82. -continue Increased dose of Synthroid 100. - repeat TSH in 2 to 3 weeks.  Paroxysmal A. Fib.. Currently in sinus. Amiodarone was discontinued. -eliquis resumed after PEG tube exchange.  Type 2 diabetes. -Continue SSI. -s/p Lantus 10  units daily, DC'd on 04/10/2019 due to hypoglycemic episodes   Chronic diastolic heart failure.  - Volume being managed by dialysis.  Bilateral decubitus ulcers.  Present on admission. -Continue with wound care. -Wound care nurse to follow sacral wound; consulted again on 03/24/19   Nutrition.  PEG placed 03/03/2019. PEG tube clogged on 2/2 or 2/3, exchanged new tube on 2/6. SLP eval done, recommended n.p.o., continue PEG tube feeding and reconsult once mental status improves.  Acute hypoxic respiratory failure.  Resolved.   Patient was intubated initially, extubated on 03/05/2019. -On room air now  DVT prophylaxis: Eliquis Code Status: DNR Family Communication:  Husband updated at bedside. Disposition Plan: SNF Barrier to d/c: pt apparently does not have medicare days left and husband cannot afford private pay. Challenging discharge disposition!   Jamie Arias M.D    [facility]   CC: Primary care physician; Jamie Arias, MDPatient ID: Jamie Arias, female   DOB: May 21, 1955, 64 y.o.   MRN: [id number]

## 2019-04-13 NOTE — Progress Notes (Signed)
Hypoglycemic Event  CBG: 53  Treatment: D50 25 mL (12.5 gm)  Symptoms: None  Follow-up CBG: [id number] CBG Result:96  Possible Reasons for Event: Unknown  Comments/MD notified:yes    Jamie Arias [facility]

## 2019-04-13 NOTE — Progress Notes (Signed)
[facility], [place] 04/13/19  Subjective:   Hospital day # 58  Seen during HD Tolerating well Dialysis today in recliner chair   HEMODIALYSIS FLOWSHEET:  Blood Flow Rate (mL/min): 350 mL/min Arterial Pressure (mmHg): -140 mmHg Venous Pressure (mmHg): 100 mmHg Transmembrane Pressure (mmHg): 60 mmHg Ultrafiltration Rate (mL/min): 860 mL/min Dialysate Flow Rate (mL/min): 600 ml/min Conductivity: Machine : 14 Conductivity: Machine : 14 Dialysis Fluid Bolus: Albumin Bolus Amount (mL): 50 mL Dialysate Change: (3K)  Renal: 02/07 0701 - 02/08 0700 In: 480  Out: -      Objective:  Vital signs in last 24 hours:  Temp:  [98.7 F (37.1 C)-98.9 F (37.2 C)] 98.9 F (37.2 C) (02/08 0422) Pulse Rate:  [70-78] 78 (02/08 0422) Resp:  [16-20] 18 (02/08 0422) BP: (159-168)/(60-72) 161/60 (02/08 0422) SpO2:  [91 %-98 %] 91 % (02/08 0422)  Weight change:  Filed Weights   03/27/19 1045 03/31/19 1220 04/07/19 1700  Weight: 81 kg 83.5 kg 84.6 kg    Intake/Output:    Intake/Output Summary (Last 24 hours) at 04/13/2019 0900 Last data filed at 04/12/2019 1800 Gross per 24 hour  Intake 480 ml  Output --  Net 480 ml     Physical Exam: General:  Chronically ill-appearing,   HEENT  anicteric, moist oral mucous membranes  Pulm/lungs  shallow breathing effort, no crackles  CVS/Heart  irregular rhythm, no rub  Abdomen:   Soft, nontender, PEG tube in place  Extremities:  + dependent edema  Neurologic:  Alert, oriented to self, able to answer simple questions  Skin:  scattered Ecchymosis  Access:  PermCath Left IJ       Basic Metabolic Panel:  Recent Labs  Lab 04/09/19 0742 04/09/19 0742 04/10/19 1125 04/10/19 1125 04/11/19 0544 04/12/19 0943 04/13/19 0220  NA 141  --  138  --  141 138 136  K 4.4  --  3.6  --  3.6 3.9 4.1  CL 96*  --  97*  --  99 98 96*  CO2 33*  --  33*  --  36* 32 31  GLUCOSE 90  --  98  --  80 168* 139*  BUN 19  --  19   --  10 17 25*  CREATININE 1.98*  --  2.11*  --  1.78* 2.90* 3.16*  CALCIUM 9.1   < > 8.1*   < > 8.5* 8.7* 8.4*  PHOS 3.7  --  3.5  --  3.5 4.3 4.4   < > = values in this interval not displayed.     CBC: Recent Labs  Lab 04/07/19 0538 04/08/19 1201 04/10/19 1125 04/12/19 0943 04/13/19 0220  WBC 5.6 5.1 4.9 4.6 4.9  HGB 9.5* 8.7* 8.2* 9.5* 8.7*  HCT 29.9* 27.0* 25.6* 30.5* 27.8*  MCV 96.5 96.4 98.1 99.3 98.9  PLT 257 243 207 217 212      Lab Results  Component Value Date   HEPBSAG NON REACTIVE 03/18/2019      Microbiology:  Recent Results (from the past 240 hour(s))  MRSA PCR Screening     Status: None   Collection Time: 04/03/19  8:15 PM   Specimen: Nasopharyngeal  Result Value Ref Range Status   MRSA by PCR NEGATIVE NEGATIVE Final    Comment:        The GeneXpert MRSA Assay (FDA approved for NASAL specimens only), is one component of a comprehensive MRSA colonization surveillance program. It is not intended to diagnose MRSA  infection nor to guide or monitor treatment for MRSA infections. Performed at [facility], [address] [place], [place] [address]     Coagulation Studies: No results for input(s): LABPROT, INR in the last 72 hours.  Urinalysis: No results for input(s): COLORURINE, LABSPEC, PHURINE, GLUCOSEU, HGBUR, BILIRUBINUR, KETONESUR, PROTEINUR, UROBILINOGEN, NITRITE, LEUKOCYTESUR in the last 72 hours.  Invalid input(s): APPERANCEUR    Imaging: No results found.   Medications:   . sodium chloride 20 mL (03/23/19 1852)  . feeding supplement (OSMOLITE 1.5 CAL) 1,000 mL (04/12/19 1817)   . acidophilus  2 capsule Oral BID  . alteplase  2 mg Intracatheter Once  . alteplase  2 mg Intracatheter Once  . apixaban  5 mg Oral BID  . aspirin  81 mg Per Tube Daily  . B-complex with vitamin C  1 tablet Per Tube Daily  . Chlorhexidine Gluconate Cloth  6 each Topical Q0600  . epoetin (EPOGEN/PROCRIT) injection  10,000 Units  Intravenous Q M,W,F-HD  . feeding supplement (PRO-STAT SUGAR FREE 64)  30 mL Per Tube Daily  . [START ON 04/17/2019] ferrous gluconate  324 mg Oral Daily  . fluticasone  2 spray Each Nare Daily  . folic acid  1 mg Intravenous Daily  . free water  20 mL Per Tube Q4H  . insulin aspart  0-15 Units Subcutaneous Q4H  . levothyroxine  88 mcg Per Tube Q0600  . mouth rinse  15 mL Mouth Rinse BID  . metoCLOPramide (REGLAN) injection  10 mg Intravenous Q6H  . pantoprazole (PROTONIX) IV  40 mg Intravenous Q12H  . polyethylene glycol  17 g Oral BID  . senna-docusate  1 tablet Oral BID   sodium chloride, acetaminophen, ipratropium-albuterol, lip balm, morphine injection, naphazoline-glycerin, ondansetron (ZOFRAN) IV, promethazine, promethazine, sodium chloride, traMADol  Assessment/ Plan:  64 y.o. female with  end-stage renal disease on hemodialysis, atrial fibrillation, chronic diastolic congestive heart failure, coronary artery disease status post CABG, diabetes mellitus type II, hypertension, hyperlipidemia, hypothyroidism, Covid positive in July 2020, recent nursing home stay in [place], Prolonged hospitalization for MRSA bacteremia/sepsis  admitted on 02/09/2019 for ESRD (end stage renal disease) ([place]) [N18.6] Altered mental status, unspecified altered mental status type [R41.82] Chest pain, unspecified type [R07.9] Sepsis, due to unspecified organism, unspecified whether acute organ dysfunction present ([place]) [A41.9]  [facility] MWF [facility] Lupus   #End-stage renal disease Continue hemodialysis via PermCath on MWF schedule Dialysis in chair Fluid removal as tolerated  UF goal ~ 2 L as tolerated  #Acute encephalopathy Slow improvement Able to answer a few simple questions  #MRSA sepsis Right arm AV graft removed this admission on March 04, 2019  #Anemia of chronic kidney disease Epogen scheduled with dialysis   iron tab with Peg tube Lab Results  Component Value Date   HGB 8.7  (L) 04/13/2019  Continue now Epogen 10,000 units IV MWF with dialysis  #Secondary hyperparathyroidism Lab Results  Component Value Date   PTH 135 (H) 02/22/2017   CALCIUM 8.4 (L) 04/13/2019   CAION 1.20 07/03/2013   PHOS 4.4 04/13/2019  not on binders at present Tube feeds Osmolite 1.5 Cal Feeding supplements prostat 30 mL daily    LOS: [place] 2/8/20219:00 [place], [place]  Note: This note was prepared with Dragon dictation. Any transcription errors are unintentional

## 2019-04-13 NOTE — TOC Progression Note (Signed)
Transition of Care [facility]) - Progression Note    Patient Details  Name: ELIZE MOURE MRN: [id number] Date of Birth: 08/30/1955  Transition of Care [facility]) CM/SW Contact  Candie Chroman, LCSW Phone Number: 04/13/2019, 1:09 PM  Clinical Narrative:  No return call from [facility] worker yet. CSW tried calling again. No answer/unable to leave voicemail. Will try again later. Sent SNF referral back out to see who could accept a Letter of Guarantee if approved.    Expected Discharge Plan: [place] Barriers to Discharge: Continued Medical Work up  Expected Discharge Plan and Services Expected Discharge Plan: [place] In-house Referral: Clinical Social Work     Living arrangements for the past 2 months: Mobile Home                                       Social Determinants of Health (SDOH) Interventions    Readmission Risk Interventions Readmission Risk Prevention Plan 02/13/2019 09/15/2018  Transportation Screening - Complete  Medication Review Press photographer) Complete Complete  PCP or Specialist appointment within 3-5 days of discharge - Complete  HRI or [place] Complete Complete  SW Recovery Care/Counseling Consult Complete Complete  [place] Not Applicable Complete  Some recent data might be hidden

## 2019-04-14 DIAGNOSIS — Z515 Encounter for palliative care: Secondary | ICD-10-CM

## 2019-04-14 DIAGNOSIS — Z7189 Other specified counseling: Secondary | ICD-10-CM

## 2019-04-14 LAB — MISC LABCORP TEST (SEND OUT): Labcorp test code: 505265

## 2019-04-14 LAB — RENAL FUNCTION PANEL
Albumin: 2.2 g/dL — ABNORMAL LOW (ref 3.5–5.0)
Anion gap: 6 (ref 5–15)
BUN: 18 mg/dL (ref 8–23)
CO2: 31 mmol/L (ref 22–32)
Calcium: 8.6 mg/dL — ABNORMAL LOW (ref 8.9–10.3)
Chloride: 99 mmol/L (ref 98–111)
Creatinine, Ser: 2.33 mg/dL — ABNORMAL HIGH (ref 0.44–1.00)
GFR calc Af Amer: 25 mL/min — ABNORMAL LOW (ref >60)
GFR calc non Af Amer: 21 mL/min — ABNORMAL LOW (ref >60)
Glucose, Bld: 181 mg/dL — ABNORMAL HIGH (ref 70–99)
Phosphorus: 3 mg/dL (ref 2.5–4.6)
Potassium: 4.7 mmol/L (ref 3.5–5.1)
Sodium: 136 mmol/L (ref 135–145)

## 2019-04-14 LAB — GLUCOSE, CAPILLARY
Glucose-Capillary: 110 mg/dL — ABNORMAL HIGH (ref 70–99)
Glucose-Capillary: 139 mg/dL — ABNORMAL HIGH (ref 70–99)
Glucose-Capillary: 140 mg/dL — ABNORMAL HIGH (ref 70–99)
Glucose-Capillary: 152 mg/dL — ABNORMAL HIGH (ref 70–99)
Glucose-Capillary: 153 mg/dL — ABNORMAL HIGH (ref 70–99)
Glucose-Capillary: 155 mg/dL — ABNORMAL HIGH (ref 70–99)

## 2019-04-14 MED ORDER — LABETALOL HCL 5 MG/ML IV SOLN
10.0000 mg | INTRAVENOUS | Status: DC | PRN
  Administered 2019-04-14: 10 mg via INTRAVENOUS
  Filled 2019-04-14: qty 4

## 2019-04-14 NOTE — Progress Notes (Signed)
[place], [place] 04/14/19  Subjective:   Hospital day # 64  Doing fair.  Tolerated hemodialysis well yesterday. Ultrafiltration of 2 L.  Denies any pain or shortness of breath today  Renal: 02/08 0701 - 02/09 0700 In: -  Out: 2000      Objective:  Vital signs in last 24 hours:  Temp:  [97.7 F (36.5 C)-99 F (37.2 C)] 97.7 F (36.5 C) (02/09 1214) Pulse Rate:  [73-85] 73 (02/09 1214) Resp:  [18] 18 (02/09 0445) BP: (146-179)/(63-67) 146/67 (02/09 1214) SpO2:  [98 %-100 %] 99 % (02/09 1214) Weight:  [91.8 kg] 91.8 kg (02/09 1210)  Weight change:  Filed Weights   04/07/19 1700 04/14/19 1210  Weight: 84.6 kg 91.8 kg    Intake/Output:    Intake/Output Summary (Last 24 hours) at 04/14/2019 1408 Last data filed at 04/14/2019 0727 Gross per 24 hour  Intake 619 ml  Output --  Net 619 ml     Physical Exam: General:  Chronically ill-appearing,   HEENT  anicteric, moist oral mucous membranes  Pulm/lungs  shallow breathing effort, no crackles  CVS/Heart  irregular rhythm, no rub  Abdomen:   Soft, nontender, PEG tube in place  Extremities:  + dependent edema  Neurologic:  Alert, oriented to self, able to answer simple questions, moving left arm  Skin:  scattered Ecchymosis  Access:  PermCath Left IJ       Basic Metabolic Panel:  Recent Labs  Lab 04/10/19 1125 04/10/19 1125 04/11/19 0544 04/11/19 0544 04/12/19 0943 04/13/19 0220 04/14/19 0643  NA 138  --  141  --  138 136 136  K 3.6  --  3.6  --  3.9 4.1 4.7  CL 97*  --  99  --  98 96* 99  CO2 33*  --  36*  --  32 31 31  GLUCOSE 98  --  80  --  168* 139* 181*  BUN 19  --  10  --  17 25* 18  CREATININE 2.11*  --  1.78*  --  2.90* 3.16* 2.33*  CALCIUM 8.1*   < > 8.5*   < > 8.7* 8.4* 8.6*  PHOS 3.5  --  3.5  --  4.3 4.4 3.0   < > = values in this interval not displayed.     CBC: Recent Labs  Lab 04/08/19 1201 04/10/19 1125 04/12/19 0943 04/13/19 0220  WBC 5.1 4.9  4.6 4.9  HGB 8.7* 8.2* 9.5* 8.7*  HCT 27.0* 25.6* 30.5* 27.8*  MCV 96.4 98.1 99.3 98.9  PLT 243 207 217 212      Lab Results  Component Value Date   HEPBSAG NON REACTIVE 03/18/2019      Microbiology:  No results found for this or any previous visit (from the past 240 hour(s)).  Coagulation Studies: No results for input(s): LABPROT, INR in the last 72 hours.  Urinalysis: No results for input(s): COLORURINE, LABSPEC, PHURINE, GLUCOSEU, HGBUR, BILIRUBINUR, KETONESUR, PROTEINUR, UROBILINOGEN, NITRITE, LEUKOCYTESUR in the last 72 hours.  Invalid input(s): APPERANCEUR    Imaging: No results found.   Medications:   . sodium chloride 20 mL (03/23/19 1852)  . feeding supplement (OSMOLITE 1.5 CAL) 1,000 mL (04/13/19 1810)   . acidophilus  2 capsule Oral BID  . alteplase  2 mg Intracatheter Once  . alteplase  2 mg Intracatheter Once  . apixaban  5 mg Oral BID  . aspirin  81 mg Per Tube Daily  .  B-complex with vitamin C  1 tablet Per Tube Daily  . Chlorhexidine Gluconate Cloth  6 each Topical Q0600  . epoetin (EPOGEN/PROCRIT) injection  10,000 Units Intravenous Q M,W,F-HD  . feeding supplement (PRO-STAT SUGAR FREE 64)  30 mL Per Tube Daily  . [START ON 04/17/2019] ferrous gluconate  324 mg Oral Daily  . fluticasone  2 spray Each Nare Daily  . folic acid  1 mg Intravenous Daily  . free water  20 mL Per Tube Q4H  . insulin aspart  0-15 Units Subcutaneous Q4H  . levothyroxine  88 mcg Per Tube Q0600  . mouth rinse  15 mL Mouth Rinse BID  . metoCLOPramide (REGLAN) injection  10 mg Intravenous Q6H  . pantoprazole (PROTONIX) IV  40 mg Intravenous Q12H  . polyethylene glycol  17 g Oral BID  . senna-docusate  1 tablet Oral BID   sodium chloride, acetaminophen, ipratropium-albuterol, labetalol, lip balm, morphine injection, naphazoline-glycerin, ondansetron (ZOFRAN) IV, promethazine, promethazine, sodium chloride, traMADol  Assessment/ Plan:  64 y.o. female with  end-stage renal  disease on hemodialysis, atrial fibrillation, chronic diastolic congestive heart failure, coronary artery disease status post CABG, diabetes mellitus type II, hypertension, hyperlipidemia, hypothyroidism, Covid positive in July 2020, recent nursing home stay in [place], Prolonged hospitalization for MRSA bacteremia/sepsis  admitted on 02/09/2019 for ESRD (end stage renal disease) ([place]) [N18.6] Altered mental status, unspecified altered mental status type [R41.82] Chest pain, unspecified type [R07.9] Sepsis, due to unspecified organism, unspecified whether acute organ dysfunction present ([place]) [A41.9]  [facility] M[facility]   #End-stage renal disease Continue hemodialysis via PermCath on MWF schedule Dialysis in chair Fluid removal as tolerated  UF goal ~ 2 L as tolerated Next hemodialysis planned for Wednesday  #Anemia of chronic kidney disease Epogen scheduled with dialysis   iron tab with Peg tube Lab Results  Component Value Date   HGB 8.7 (L) 04/13/2019  Continue now Epogen 10,000 units IV MWF with dialysis  #Secondary hyperparathyroidism Lab Results  Component Value Date   PTH 135 (H) 02/22/2017   CALCIUM 8.6 (L) 04/14/2019   CAION 1.20 07/03/2013   PHOS 3.0 04/14/2019  not on binders at present Tube feeds Osmolite 1.5 Cal Feeding supplements prostat 30 mL daily  #Acute encephalopathy Slow improvement Able to answer a few simple questions  #MRSA sepsis Right arm AV graft removed this admission on March 04, 2019   LOS: [place] 2/9/20212:08 PM  [place], [place]  Note: This note was prepared with Dragon dictation. Any transcription errors are unintentional

## 2019-04-14 NOTE — Progress Notes (Signed)
[place] at [place] NAME: Jamie Arias    MR#:  [id number]  DATE OF BIRTH:  April 02, 1955  SUBJECTIVE:  Had loose stool today.  No change, pt mostly sleeping with husband by her side napping as well.  No noted fever, N/V, dyspnea.   REVIEW OF SYSTEMS:   Review of Systems  Unable to perform ROS: Mental acuity   Tolerating Diet: PEG feeding Tolerating PT: not able to participate fully yet  DRUG ALLERGIES:   Allergies  Allergen Reactions  . Nsaids Other (See Comments)    Contraindicated due to kidney disease.  Marland Kitchen Doxycycline Other (See Comments)    tremor    VITALS:  Blood pressure (!) 141/68, pulse 80, temperature 98.1 F (36.7 C), temperature source Oral, resp. rate 20, height 5' 4.02" (1.626 m), weight 91.8 kg, SpO2 95 %.  PHYSICAL EXAMINATION:   Physical Exam limited due to pt participation and MS changes  Constitutional: NAD, sleeping comfortably with husband napping by her side HEENT: conjunctivae and lids normal, EOMI HD cath+ CV: RRR. Distal pulses +2.  No cyanosis.   RESP: CTA B/L over anterior, normal respiratory effort, on RA GI: +BS, NTND, PEG tube in place Extremities: bilateral feet in soft boots SKIN: warm, dry, extensive bruising   LABORATORY PANEL:  CBC Recent Labs  Lab 04/13/19 0220  WBC 4.9  HGB 8.7*  HCT 27.8*  PLT 212    Chemistries  Recent Labs  Lab 04/14/19 0643  NA 136  K 4.7  CL 99  CO2 31  GLUCOSE 181*  BUN 18  CREATININE 2.33*  CALCIUM 8.6*   Cardiac Enzymes No results for input(s): TROPONINI in the last 168 hours. RADIOLOGY:  No results found. ASSESSMENT AND PLAN:  JamieJamie F Kinneyis a 64 y.o.white femalewith end-stage renal disease on hemodialysis, atrial fibrillation, chronic diastolic congestive heart failure, coronary artery disease status post CABG, diabetes mellitus type II, hypertension, hyperlipidemia, hypothyroidism, Covid positive in July 2020, recent nursing home  stay in [place]. Prolonged hospitalization for MRSA bacteremia/sepsis and now with acute encephalopathy.  Acute metabolic encephalopathy:  -mental status improving slowly with patient saying a few words every now and then. Cannot hold meaningful conversation -MRI of brain and EEG negative. Patient remained afebrile with no leukocytosis. -B12 within normal range - IR  performed LP on 03/20/19 AM: CSF shows only 8 WBC, Protein elevated at 73, Glucose 83. Negative for Toxo. CSF Cx final -- negative.  - ID, Neurology assistance   -Palliative care consulted -SLP eval recommended continue PEG tube feeding and aspiration precautions.  Reconsult once patient's mental status improves   Sepsis secondary to MRSA bacteremia.   -  Right arm graft was removed on 03/04/2019. -Completed vancomycin for 6-week course on 04/01/2019.   Anemia of chronic illness.   -Status post transfusion 1 unit of PRBC on 03/17/2019 -  hemoglobin stable at 6.9---transfused on 04/07/19--9.5 -  Iron studies consistent with anemia of chronic illness.     -B12 within normal range -Continue EPO with dialysis.  Hypotension, resolved  Hypoalbuminemia  -midodrine d/c'ed -blood pressure currently on the high side.    ESRD on HD (MWF).    -Currently using tunneled catheter as graft was removed due to MRSA bacteremia. -Continue with scheduled dialysis per nephrology -patient has tolerated for our dialysis treatment sitting up.  Left arm DVT.   -Currently on Eliquis - recent diagnosis of left arm DVT on 03/03/2019.  Hypothyroidism.  Repeat TSH  elevated at 17.82. -continue Increased dose of Synthroid 100. - repeat TSH in 2 to 3 weeks.  Paroxysmal A. Fib.. Currently in sinus. Amiodarone was discontinued. -continue eliquis   Type 2 diabetes. Hypoglycemic episodes --Most recent A1c in Dec 2020 was 5.3, BG during hospitalization never went above 200, has been receiving SSI with multiple hypoglycemic episodes.  No need  for further fingersticks and SSI since BG within inpatient goals.  PLAN: --Discontinue all insulin and fingersticks.    Chronic diastolic heart failure.  - Volume being managed by dialysis.  Bilateral decubitus ulcers.  Present on admission. -Continue with wound care. -Wound care nurse to follow sacral wound; consulted again on 03/24/19   Nutrition.  PEG placed 03/03/2019. PEG tube clogged on 2/2 or 2/3, exchanged new tube on 2/6. SLP eval done, recommended n.p.o., continue PEG tube feeding and reconsult once mental status improves.  Acute hypoxic respiratory failure.  Resolved.   Patient was intubated initially, extubated on 03/05/2019. -On room air now   DVT prophylaxis: Eliquis Code Status: DNR Family Communication:  Husband updated at bedside. Disposition Plan:  Barrier to d/c: pt apparently does not have medicare days left and husband cannot afford private pay. Challenging discharge disposition!   Enzo Bi M.D    [facility]   CC: Primary care physician; Jamie Arias, MDPatient ID: Jamie Arias, female   DOB: 1956-03-02, 64 y.o.   MRN: [id number]

## 2019-04-14 NOTE — Progress Notes (Signed)
Palliative: Jamie Arias is resting quietly in bed.  She greets me, making and mostly keeping eye contact.  She appears weak and frail.  There is no family at bedside at this time. We talked about her current health conditions, her frailty.  She tells me she is agreeable to go to rehab.  We also talked about how she is tolerating dialysis, well at this point. As we are talking her husband arrives.  We talked about discharge goals, with the ultimate goal of having Jamie Arias return to her own home. Conference with bedside nursing staff related to patient condition, needs.  Conference with transition of care team related to disposition needs.  Plan: DC to rehab if possible, outpatient dialysis.     70 minutes Quinn Axe, NP Palliative Medicine Team Team Phone # [phone] Greater than 50% of this time was spent counseling and coordinating care related to the above assessment and plan.

## 2019-04-15 LAB — RENAL FUNCTION PANEL
Albumin: 2.1 g/dL — ABNORMAL LOW (ref 3.5–5.0)
Anion gap: 6 (ref 5–15)
BUN: 27 mg/dL — ABNORMAL HIGH (ref 8–23)
CO2: 31 mmol/L (ref 22–32)
Calcium: 8.3 mg/dL — ABNORMAL LOW (ref 8.9–10.3)
Chloride: 96 mmol/L — ABNORMAL LOW (ref 98–111)
Creatinine, Ser: 2.53 mg/dL — ABNORMAL HIGH (ref 0.44–1.00)
GFR calc Af Amer: 22 mL/min — ABNORMAL LOW (ref >60)
GFR calc non Af Amer: 19 mL/min — ABNORMAL LOW (ref >60)
Glucose, Bld: 164 mg/dL — ABNORMAL HIGH (ref 70–99)
Phosphorus: 2.6 mg/dL (ref 2.5–4.6)
Potassium: 5.1 mmol/L (ref 3.5–5.1)
Sodium: 133 mmol/L — ABNORMAL LOW (ref 135–145)

## 2019-04-15 LAB — GLUCOSE, CAPILLARY
Glucose-Capillary: 149 mg/dL — ABNORMAL HIGH (ref 70–99)
Glucose-Capillary: 173 mg/dL — ABNORMAL HIGH (ref 70–99)

## 2019-04-15 NOTE — TOC Progression Note (Signed)
Transition of Care [facility]) - Progression Note    Patient Details  Name: Jamie Arias MRN: [id number] Date of Birth: 11/22/1955  Transition of Care [facility]) CM/SW [place], LCSW Phone Number: 04/15/2019, 12:29 PM  Clinical Narrative:  Spoke with patient's Medicaid worker yesterday. She said patient's Medicaid application should be approved by the weekend to cover her medical bills. CSW asked if this would also cover SNF placement and she said that would have to be something to be applied for separately. Sent this information to the [facility] for review. [facility] in [place] are aware and reviewing patient's referral.   Expected Discharge Plan: [place] Barriers to Discharge: Continued Medical Work up  Expected Discharge Plan and Services Expected Discharge Plan: [place] In-house Referral: Clinical Social Work     Living arrangements for the past 2 months: Mobile Home                                       Social Determinants of Health (SDOH) Interventions    Readmission Risk Interventions Readmission Risk Prevention Plan 02/13/2019 09/15/2018  Transportation Screening - Complete  Medication Review Press photographer) Complete Complete  PCP or Specialist appointment within 3-5 days of discharge - Complete  HRI or [place] Complete Complete  SW Recovery Care/Counseling Consult Complete Complete  [place] Not Applicable Complete  Some recent data might be hidden

## 2019-04-15 NOTE — Progress Notes (Signed)
Patient complained of nausea; HOB 45', TF paused and PEG tube residual checked: 15 ml, replaced and TF restarted; Zofran IV given; will continue to monitor nausea. Husband updated. Barbaraann Faster, RN 9:16 PM 04/15/2019

## 2019-04-15 NOTE — Progress Notes (Signed)
This note also relates to the following rows which could not be included: Pulse Rate - Cannot attach notes to unvalidated device data Resp - Cannot attach notes to unvalidated device data  Hd started  

## 2019-04-15 NOTE — Progress Notes (Signed)
This note also relates to the following rows which could not be included: Pulse Rate - Cannot attach notes to unvalidated device data Resp - Cannot attach notes to unvalidated device data BP - Cannot attach notes to unvalidated device data  Hd completed  

## 2019-04-15 NOTE — Progress Notes (Signed)
PT Cancellation Note  Patient Details Name: Jamie Arias MRN: [id number] DOB: [birth year]   Cancelled Treatment:    Reason Eval/Treat Not Completed: Patient at procedure or test/unavailable.  PT attempted to see pt this morning but pt's nurse reported that they were preparing transport to come get pt for dialysis (so pt not available for PT session); pt currently still off floor for dialysis.  Will re-attempt PT session at a later date/time.  Leitha Bleak, PT 04/15/19, 1:21 PM

## 2019-04-15 NOTE — Progress Notes (Signed)
[place], [place] 04/15/19  Subjective:   Hospital day # 20  Seen during HD. Tolerating fair. BP low normal. UF goal decreased   HEMODIALYSIS FLOWSHEET:  Blood Flow Rate (mL/min): 400 mL/min Arterial Pressure (mmHg): -50 mmHg Venous Pressure (mmHg): 130 mmHg Transmembrane Pressure (mmHg): 60 mmHg Ultrafiltration Rate (mL/min): 540 mL/min Dialysate Flow Rate (mL/min): 600 ml/min Conductivity: Machine : 13.8 Conductivity: Machine : 13.8 Dialysis Fluid Bolus: Normal Saline Bolus Amount (mL): 250 mL Dialysate Change: (3K)    Renal: 02/09 0701 - 02/10 0700 In: 1207 [NG/GT:1207] Out: -      Objective:  Vital signs in last 24 hours:  Temp:  [97.7 F (36.5 C)-98.1 F (36.7 C)] 98.1 F (36.7 C) (02/10 0341) Pulse Rate:  [72-80] 74 (02/10 1115) Resp:  [13-20] 15 (02/10 1115) BP: (115-184)/(48-77) 115/66 (02/10 1115) SpO2:  [95 %-99 %] 99 % (02/10 0955) Weight:  [87.9 kg-91.8 kg] 87.9 kg (02/10 0955)  Weight change:  Filed Weights   04/14/19 1210 04/15/19 0341 04/15/19 0955  Weight: 91.8 kg 89.2 kg 87.9 kg    Intake/Output:    Intake/Output Summary (Last 24 hours) at 04/15/2019 1200 Last data filed at 04/15/2019 0510 Gross per 24 hour  Intake 588 ml  Output --  Net 588 ml     Physical Exam: General:  Chronically ill-appearing,   HEENT  anicteric, moist oral mucous membranes  Pulm/lungs  shallow breathing effort, no crackles  CVS/Heart  irregular rhythm, no rub  Abdomen:   Soft, nontender, PEG tube in place  Extremities:  + dependent edema  Neurologic:  resting quietly  Skin:  scattered Ecchymosis  Access:  PermCath Left IJ       Basic Metabolic Panel:  Recent Labs  Lab 04/11/19 0544 04/11/19 0544 04/12/19 0943 04/12/19 0943 04/13/19 0220 04/14/19 0643 04/15/19 1012  NA 141  --  138  --  136 136 133*  K 3.6  --  3.9  --  4.1 4.7 5.1  CL 99  --  98  --  96* 99 96*  CO2 36*  --  32  --  31 31 31   GLUCOSE 80  --   168*  --  139* 181* 164*  BUN 10  --  17  --  25* 18 27*  CREATININE 1.78*  --  2.90*  --  3.16* 2.33* 2.53*  CALCIUM 8.5*   < > 8.7*   < > 8.4* 8.6* 8.3*  PHOS 3.5  --  4.3  --  4.4 3.0 2.6   < > = values in this interval not displayed.     CBC: Recent Labs  Lab 04/08/19 1201 04/10/19 1125 04/12/19 0943 04/13/19 0220  WBC 5.1 4.9 4.6 4.9  HGB 8.7* 8.2* 9.5* 8.7*  HCT 27.0* 25.6* 30.5* 27.8*  MCV 96.4 98.1 99.3 98.9  PLT 243 207 217 212      Lab Results  Component Value Date   HEPBSAG NON REACTIVE 03/18/2019      Microbiology:  No results found for this or any previous visit (from the past 240 hour(s)).  Coagulation Studies: No results for input(s): LABPROT, INR in the last 72 hours.  Urinalysis: No results for input(s): COLORURINE, LABSPEC, PHURINE, GLUCOSEU, HGBUR, BILIRUBINUR, KETONESUR, PROTEINUR, UROBILINOGEN, NITRITE, LEUKOCYTESUR in the last 72 hours.  Invalid input(s): APPERANCEUR    Imaging: No results found.   Medications:   . sodium chloride 20 mL (03/23/19 1852)  . feeding supplement (OSMOLITE 1.5 CAL) 1,000 mL (  04/14/19 1647)   . acidophilus  2 capsule Oral BID  . alteplase  2 mg Intracatheter Once  . alteplase  2 mg Intracatheter Once  . apixaban  5 mg Oral BID  . aspirin  81 mg Per Tube Daily  . B-complex with vitamin C  1 tablet Per Tube Daily  . Chlorhexidine Gluconate Cloth  6 each Topical Q0600  . epoetin (EPOGEN/PROCRIT) injection  10,000 Units Intravenous Q M,W,F-HD  . feeding supplement (PRO-STAT SUGAR FREE 64)  30 mL Per Tube Daily  . [START ON 04/17/2019] ferrous gluconate  324 mg Oral Daily  . fluticasone  2 spray Each Nare Daily  . folic acid  1 mg Intravenous Daily  . free water  20 mL Per Tube Q4H  . levothyroxine  88 mcg Per Tube Q0600  . mouth rinse  15 mL Mouth Rinse BID  . metoCLOPramide (REGLAN) injection  10 mg Intravenous Q6H  . pantoprazole (PROTONIX) IV  40 mg Intravenous Q12H  . polyethylene glycol  17 g Oral  BID  . senna-docusate  1 tablet Oral BID   sodium chloride, acetaminophen, ipratropium-albuterol, labetalol, lip balm, morphine injection, naphazoline-glycerin, ondansetron (ZOFRAN) IV, promethazine, promethazine, sodium chloride, traMADol  Assessment/ Plan:  64 y.o. female with  end-stage renal disease on hemodialysis, atrial fibrillation, chronic diastolic congestive heart failure, coronary artery disease status post CABG, diabetes mellitus type II, hypertension, hyperlipidemia, hypothyroidism, Covid positive in July 2020, recent nursing home stay in [place], Prolonged hospitalization for MRSA bacteremia/sepsis  admitted on 02/09/2019 for ESRD (end stage renal disease) ([place]) [N18.6] Altered mental status, unspecified altered mental status type [R41.82] Chest pain, unspecified type [R07.9] Sepsis, due to unspecified organism, unspecified whether acute organ dysfunction present ([place]) [A41.9]  [facility] MWF [facility]   #End-stage renal disease Continue hemodialysis via PermCath on MWF schedule  tolerating fair Awaiting disposition to nursing home. (medicaid funding awaited as patient has used up medicare allowance)  #Anemia of chronic kidney disease Epogen scheduled with dialysis   iron tab with Peg tube Lab Results  Component Value Date   HGB 8.7 (L) 04/13/2019  Continue now Epogen 10,000 units IV MWF with dialysis  #Secondary hyperparathyroidism Lab Results  Component Value Date   PTH 135 (H) 02/22/2017   CALCIUM 8.3 (L) 04/15/2019   CAION 1.20 07/03/2013   PHOS 2.6 04/15/2019  not on binders at present Tube feeds Osmolite 1.5 Cal Feeding supplements prostat 30 mL daily  #Acute encephalopathy Slow improvement Able to answer a few simple questions  #MRSA sepsis Right arm AV graft removed this admission on March 04, 2019   LOS: [place] 2/10/202112:00 PM  [place], [place]  Note: This note was prepared  with Dragon dictation. Any transcription errors are unintentional

## 2019-04-15 NOTE — Progress Notes (Signed)
PROGRESS NOTE    Jamie Arias  [id number] DOB: 01-13-1956 DOA: 02/09/2019 PCP: [name], MD   Brief Narrative:  Jamie Arias a 64 y.o.white femalewith end-stage renal disease on hemodialysis, atrial fibrillation, chronic diastolic congestive heart failure, coronary artery disease status post CABG, diabetes mellitus type II, hypertension, hyperlipidemia, hypothyroidism, Covid positive in July 2020, recent nursing home stay in [place]. Prolonged hospitalization for MRSA bacteremia/sepsis and now with acute encephalopathy.  2/10: Patient seen and evaluated during hemodialysis.  Patient does respond to name.  Unable to have conversation beyond this.   Assessment & Plan:   Principal Problem:   Sepsis due to methicillin resistant Staphylococcus aureus (MRSA) ([place]) Active Problems:   Type 2 diabetes mellitus with ESRD (end-stage renal disease) (HCC)   Atrial fibrillation, chronic (HCC)   Hypotension   Chronic diastolic CHF (congestive heart failure) (HCC)   ESRD (end stage renal disease) (HCC)   Altered level of consciousness   Decubitus ulcer of heel, bilateral, stage 2 (HCC)   MRSA bacteremia   Arm DVT (deep venous thromboembolism), acute, left (HCC)   Acute encephalopathy   Acute metabolic encephalopathy   Acute blood loss anemia   AF (paroxysmal atrial fibrillation) ([place])   Diarrhea   Oropharyngeal dysphagia   Abscess   Palliative care by specialist   Goals of care, counseling/discussion  Acute metabolic encephalopathy: -mental status improving slowly with patient saying a few words every now and then. Cannot hold meaningful conversation -MRI of brain and EEG negative. Patient remained afebrile with no leukocytosis. -B12 within normal range - IR performed LP on 03/20/19 AM: CSF shows only 8 WBC, Protein elevated at 73, Glucose 83. Negative for Toxo. CSF Cx final -- negative.  - ID, Neurology assistance  -Palliative care consulted -SLP eval  recommended continue PEG tube feeding and aspiration precautions.  Reconsult once patient's mental status improves   Sepsis secondary to MRSA bacteremia. - Right arm graft was removed on 03/04/2019. -Completed vancomycin for 6-week course on 04/01/2019.  Anemia of chronic illness. -Status post transfusion 1 unit of PRBC on 03/17/2019 - hemoglobin stable at 6.9---transfused on 04/07/19--9.5 - Iron studies consistent with anemia of chronic illness.   -B12 within normal range -Continue EPO with dialysis.  Hypotension, resolved Hypoalbuminemia -midodrine d/c'ed -blood pressure currently on the high side.    ESRDon HD(MWF). -Currently using tunneled catheter as graft was removed due to MRSA bacteremia. -Continue with scheduled dialysis per nephrology -patient has tolerated for our dialysis treatment sitting up.  Left arm DVT. -Currently on Eliquis - recent diagnosis of left arm DVT on 03/03/2019.  Hypothyroidism. Repeat TSH elevated at 17.82. -continue Increased dose of Synthroid 100. - repeat TSH in 2 to 3 weeks.  Paroxysmal A. Fib.. Currently in sinus. Amiodarone was discontinued. -continue eliquis   Type 2 diabetes. Hypoglycemic episodes --Most recent A1c in Dec 2020 was 5.3, BG during hospitalization never went above 200, has been receiving SSI with multiple hypoglycemic episodes.  No need for further fingersticks and SSI since BG within inpatient goals.  PLAN: --Discontinue all insulin and fingersticks.    Chronic diastolic heart failure. -Volume being managed by dialysis.  Bilateral decubitus ulcers.Present on admission. -Continue with wound care. -Wound care nurse to follow sacral wound; consulted again on 03/24/19   Nutrition.  PEG placed 03/03/2019. PEG tube clogged on 2/2 or 2/3, exchanged new tube on 2/6. SLP eval done, recommended n.p.o., continue PEG tube feeding and reconsult once mental status improves.  Acute hypoxic  respiratory failure.Resolved.  Patient was intubated initially, extubated on 03/05/2019. -On room air now   DVT prophylaxis: Eliquis Code Status: DNR Family Communication: None today Disposition Plan: Unclear at this time.  Per last TOC note from today Medicaid application should be approved by this weekend to cover medical bills.  Unclear at this time whether this would also cover skilled nursing facility placement.  [facility] department director aware and is reviewing disposition options.  Consultants:   Nephrology  Palliative care  Procedures:   None  Antimicrobials:   None   Subjective: Seen and examined during dialysis Response to name but unable to hold any further conversation No apparent distress  Objective: Vitals:   04/15/19 1230 04/15/19 1245 04/15/19 1300 04/15/19 1306  BP: (!) 117/57 (!) 119/45 (!) 121/49 (!) 139/46  Pulse: 72 72 71 75  Resp: 15 12 15 14   Temp:   98 F (36.7 C)   TempSrc:   Oral   SpO2:      Weight:      Height:        Intake/Output Summary (Last 24 hours) at 04/15/2019 1349 Last data filed at 04/15/2019 1300 Gross per 24 hour  Intake 588 ml  Output 1500 ml  Net -912 ml   Filed Weights   04/14/19 1210 04/15/19 0341 04/15/19 0955  Weight: 91.8 kg 89.2 kg 87.9 kg    Examination: Constitutional: NAD, sleeping comfortably with husband napping by her side HEENT: conjunctivae and lids normal, EOMI HD cath+ CV: RRR. Distal pulses +2. No cyanosis.  RESP: CTA B/L over anterior, normal respiratory effort, on RA GI: +BS, NTND, PEG tube in place Extremities: bilateral feet in soft boots SKIN: warm, dry, extensive bruising      Data Reviewed: I have personally reviewed following labs and imaging studies  CBC: Recent Labs  Lab 04/10/19 1125 04/12/19 0943 04/13/19 0220  WBC 4.9 4.6 4.9  HGB 8.2* 9.5* 8.7*  HCT 25.6* 30.5* 27.8*  MCV 98.1 99.3 98.9  PLT 207 217 [id number]   Basic Metabolic Panel: Recent Labs  Lab 04/11/19 0544  04/12/19 0943 04/13/19 0220 04/14/19 0643 04/15/19 1012  NA 141 138 136 136 133*  K 3.6 3.9 4.1 4.7 5.1  CL 99 98 96* 99 96*  CO2 36* 32 31 31 31   GLUCOSE 80 168* 139* 181* 164*  BUN 10 17 25* 18 27*  CREATININE 1.78* 2.90* 3.16* 2.33* 2.53*  CALCIUM 8.5* 8.7* 8.4* 8.6* 8.3*  PHOS 3.5 4.3 4.4 3.0 2.6   GFR: Estimated Creatinine Clearance: 23.8 mL/min (A) (by C-G formula based on SCr of 2.53 mg/dL (H)). Liver Function Tests: Recent Labs  Lab 04/11/19 0544 04/12/19 0943 04/13/19 0220 04/14/19 0643 04/15/19 1012  ALBUMIN 2.5* 2.3* 2.2* 2.2* 2.1*   No results for input(s): LIPASE, AMYLASE in the last 168 hours. No results for input(s): AMMONIA in the last 168 hours. Coagulation Profile: No results for input(s): INR, PROTIME in the last 168 hours. Cardiac Enzymes: No results for input(s): CKTOTAL, CKMB, CKMBINDEX, TROPONINI in the last 168 hours. BNP (last 3 results) No results for input(s): PROBNP in the last 8760 hours. HbA1C: No results for input(s): HGBA1C in the last 72 hours. CBG: Recent Labs  Lab 04/14/19 1120 04/14/19 1611 04/14/19 2002 04/15/19 0024 04/15/19 0747  GLUCAP 153* 110* 155* 149* 173*   Lipid Profile: No results for input(s): CHOL, HDL, LDLCALC, TRIG, CHOLHDL, LDLDIRECT in the last 72 hours. Thyroid Function Tests: No results for input(s): TSH, T4TOTAL, FREET4, T3FREE,  THYROIDAB in the last 72 hours. Anemia Panel: No results for input(s): VITAMINB12, FOLATE, FERRITIN, TIBC, IRON, RETICCTPCT in the last 72 hours. Sepsis Labs: No results for input(s): PROCALCITON, LATICACIDVEN in the last 168 hours.  No results found for this or any previous visit (from the past 240 hour(s)).       Radiology Studies: No results found.      Scheduled Meds: . acidophilus  2 capsule Oral BID  . alteplase  2 mg Intracatheter Once  . alteplase  2 mg Intracatheter Once  . apixaban  5 mg Oral BID  . aspirin  81 mg Per Tube Daily  . B-complex with  vitamin C  1 tablet Per Tube Daily  . Chlorhexidine Gluconate Cloth  6 each Topical Q0600  . epoetin (EPOGEN/PROCRIT) injection  10,000 Units Intravenous Q M,W,F-HD  . feeding supplement (PRO-STAT SUGAR FREE 64)  30 mL Per Tube Daily  . [START ON 04/17/2019] ferrous gluconate  324 mg Oral Daily  . fluticasone  2 spray Each Nare Daily  . folic acid  1 mg Intravenous Daily  . free water  20 mL Per Tube Q4H  . levothyroxine  88 mcg Per Tube Q0600  . mouth rinse  15 mL Mouth Rinse BID  . metoCLOPramide (REGLAN) injection  10 mg Intravenous Q6H  . pantoprazole (PROTONIX) IV  40 mg Intravenous Q12H  . polyethylene glycol  17 g Oral BID  . senna-docusate  1 tablet Oral BID   Continuous Infusions: . sodium chloride 20 mL (03/23/19 1852)  . feeding supplement (OSMOLITE 1.5 CAL) 1,000 mL (04/14/19 1647)     LOS: 65 days    Time spent: 35 minutes    Sidney Ace, MD [facility] Pager [phone]  If 7PM-7AM, please contact night-coverage  04/15/2019, 1:49 PM

## 2019-04-16 ENCOUNTER — Inpatient Hospital Stay: Payer: Medicare Other

## 2019-04-16 LAB — CBC
HCT: 29.5 % — ABNORMAL LOW (ref 36.0–46.0)
Hemoglobin: 9.2 g/dL — ABNORMAL LOW (ref 12.0–15.0)
MCH: 30.7 pg (ref 26.0–34.0)
MCHC: 31.2 g/dL (ref 30.0–36.0)
MCV: 98.3 fL (ref 80.0–100.0)
Platelets: 177 10*3/uL (ref 150–400)
RBC: 3 MIL/uL — ABNORMAL LOW (ref 3.87–5.11)
RDW: 16.3 % — ABNORMAL HIGH (ref 11.5–15.5)
WBC: 4.1 10*3/uL (ref 4.0–10.5)
nRBC: 0 % (ref 0.0–0.2)

## 2019-04-16 LAB — RENAL FUNCTION PANEL
Albumin: 2.1 g/dL — ABNORMAL LOW (ref 3.5–5.0)
Anion gap: 8 (ref 5–15)
BUN: 22 mg/dL (ref 8–23)
CO2: 29 mmol/L (ref 22–32)
Calcium: 8.9 mg/dL (ref 8.9–10.3)
Chloride: 96 mmol/L — ABNORMAL LOW (ref 98–111)
Creatinine, Ser: 2.07 mg/dL — ABNORMAL HIGH (ref 0.44–1.00)
GFR calc Af Amer: 28 mL/min — ABNORMAL LOW (ref >60)
GFR calc non Af Amer: 25 mL/min — ABNORMAL LOW (ref >60)
Glucose, Bld: 202 mg/dL — ABNORMAL HIGH (ref 70–99)
Phosphorus: 3 mg/dL (ref 2.5–4.6)
Potassium: 5.3 mmol/L — ABNORMAL HIGH (ref 3.5–5.1)
Sodium: 133 mmol/L — ABNORMAL LOW (ref 135–145)

## 2019-04-16 LAB — HEPATITIS B SURFACE ANTIGEN: Hepatitis B Surface Ag: NONREACTIVE

## 2019-04-16 MED ORDER — PANTOPRAZOLE SODIUM 40 MG PO PACK
40.0000 mg | PACK | Freq: Two times a day (BID) | ORAL | Status: DC
  Administered 2019-04-16 – 2019-04-20 (×8): 40 mg
  Filled 2019-04-16 (×10): qty 20

## 2019-04-16 MED ORDER — SENNOSIDES-DOCUSATE SODIUM 8.6-50 MG PO TABS
1.0000 | ORAL_TABLET | Freq: Every day | ORAL | Status: DC
  Administered 2019-04-19: 1 via ORAL
  Filled 2019-04-16 (×2): qty 1

## 2019-04-16 NOTE — Evaluation (Signed)
Occupational Therapy Evaluation Patient Details Name: Jamie Arias MRN: [id number] DOB: Jan 29, 1956 Today's Date: 04/16/2019    History of Present Illness Pt is a 64 y.o. female presenting to hospital 02/09/19 with AMS.  Pt admitted with MRSA sepsis and bacteremia with hypotension, toxic metabolic encephalopathy, ESRD on HD, hypokalemia, elevated troponin, a-fib, and anemia of CKD.  LUE DVT diagnosed 03/03/19.  Of note pt had been intubated but was extubated 03/04/20.  Pt s/p 2/6 upper GI endoscopy and replace PEG tube d/t clogged gastrostomy tube.  PMH includes ESRD on HD, a-fib, chronic diastolic CHF, CAD s/p CABG, DM, htn, COVID (+) July 2020.   Clinical Impression   Ms. Chavana was seen for OT re-evaluation this date. Pt received semi-supine in bed with husband present at bedside. Per caregiver, pt is more alert and engaged this date. She is A&O to self and able to state place as hospital when provided with choices of "home" or "hospital". She currently pt demonstrates impairments as described below (See OT problem list) which functionally limit her ability to perform ADL/self-care tasks. Pt currently requires max to total assist for all BADL management at bed level. She requires a hoyer lift for functional transfers. Of note: Pt observed to have BUE resting in a fisted posture with thumbs indwelling and fingers flexed into palm. She is able to attain a near neutral hand/wrist position with PROM of her fingers, thumb, and hand but does not maintain this at rest. Pt would benefit from bilateral resting hand splints to support neutral positioning of her fingers, hands, and wrists and promote improved functional use and skin integrity. MD notified. Pt would benefit from skilled OT to address noted impairments and functional limitations (see below for any additional details) in order to maximize safety and independence while minimizing falls risk and caregiver burden. Upon hospital discharge, recommend STR  to maximize pt safety and return to PLOF.      Follow Up Recommendations  SNF    Equipment Recommendations       Recommendations for Other Services       Precautions / Restrictions Precautions Precautions: Fall Precaution Comments: L IJ HD permcath; NPO; PEG tube; aspiration precautions; sacral wound; elevate R UE; elevate HOB >30 degrees Restrictions Weight Bearing Restrictions: No      Mobility Bed Mobility Overal bed mobility: Needs Assistance             General bed mobility comments: Deferred for pt safety. Pt noted to have elevated potassium per chart and not appropriate for exertional activity.  Transfers                 General transfer comment: Deferred for pt safety. Pt noted to have elevated potassium per chart and not appropriate for exertional activity.    Balance Overall balance assessment: Needs assistance                                         ADL either performed or assessed with clinical judgement   ADL Overall ADL's : Needs assistance/impaired Eating/Feeding: Total assistance Eating/Feeding Details (indicate cue type and reason): Pt has PEG tube for feeding.                                   General ADL Comments: Pt requires max to total assist for  all ADL management at bed level. She has a PEG tube for tube feeding. Is using a Hoyer lift for transfer to/from dialysis chair, but otherwise does not engage in functional transfers.     Vision   Additional Comments: Pt tracks visual stimuli in room. Unable to assess further 2/2 cognition. Will continue to monitor within functional context.     Perception     Praxis      Pertinent Vitals/Pain Pain Assessment: No/denies pain Faces Pain Scale: No hurt Pain Location: Grimacing noted with BUE mobility. Pain Descriptors / Indicators: Grimacing Pain Intervention(s): Limited activity within patient's tolerance;Monitored during session;Repositioned      Hand Dominance Left   Extremity/Trunk Assessment Upper Extremity Assessment Upper Extremity Assessment: RUE deficits/detail;LUE deficits/detail(Very Limited AROM in B hands. She is able to attain a near neutral hand/wrist position with PROM but does not maintain this at rest) RUE Deficits / Details: unable to fully extend fingers; Wrist remains near neutral with limited PROM for flexion/extension;  R elbow flexion AAROM to 90 degrees and R elbow extension grossly 15-20 degrees short of neutral; R shoulder flexion AAROM to 80-90 degrees LUE Deficits / Details: unable to fully extend fingers; Wrist remains near neutral with limited PROM for flexion/extension;  L elbow flexion AROM to 90 degrees and L elbow extension to neutral; L shoulder AAROM to 80-90 degrees   Lower Extremity Assessment Lower Extremity Assessment: Defer to PT evaluation;Generalized weakness       Communication     Cognition Arousal/Alertness: Awake/alert Behavior During Therapy: Flat affect Overall Cognitive Status: Impaired/Different from baseline Area of Impairment: Following commands;Memory;Orientation                 Orientation Level: Disoriented to;Time;Situation(Is able to state hospital when given choice of place as home or hospital)     Following Commands: Follows one step commands inconsistently;Follows multi-step commands inconsistently;Follows one step commands with increased time;Follows multi-step commands with increased time       General Comments: does visually track and acknowledge therapist/husband, verbally responds (appropriately) to questions; increased time for processing; husband at bedside states pt is more alert this date "than she has been in a while".   General Comments       Exercises Other Exercises Other Exercises: PROM BUE shoulder, elbow, wrist, and figners within pt tolerance. x5 reps each side. Other Exercises: B hands fisted upon arrival to room, with pt unable to  open/extend fingers or wrist. Recommend resting hand splints bilaterally. MD/RN notified. This author leaves pt with BUE elevated to minimize swelling and with towel roll in B hands to promote neutral position and maximize safety, functional use, and skin integrity. Pt caregiver at bedside and educated on safe positioning and PROM for BUE this date.   Shoulder Instructions      Home Living Family/patient expects to be discharged to:: Private residence Living Arrangements: Spouse/significant other Available Help at Discharge: Family;Available 24 hours/day Type of Home: Mobile home Home Access: Stairs to enter;Ramped entrance Entrance Stairs-Number of Steps: 8   Home Layout: One level     Bathroom Shower/Tub: Occupational psychologist: Standard     Home Equipment: Environmental consultant - 2 wheels;Wheelchair - [employer];Hospital bed;Shower seat   Additional Comments: Pt's husband reports pt was independent prior to hospitalization for COVID in June 2020; pt had prolonged hospitalization but discharged late July; came home from rehab in mid November (using sit to stand mechanical lift and hospital bed).  Has been on dialysis for  3 years (used a transport chair when going out to HD prior to June 2020).      Prior Functioning/Environment Level of Independence: Needs assistance  Gait / Transfers Assistance Needed: See additional comments in home living above (pt has not been ambulatory since June 2020)              OT Problem List: Decreased strength;Decreased cognition;Impaired balance (sitting and/or standing);Decreased knowledge of precautions;Decreased safety awareness;Decreased range of motion;Decreased activity tolerance;Decreased knowledge of use of DME or AE;Impaired UE functional use;Decreased coordination      OT Treatment/Interventions: Self-care/ADL training;Therapeutic exercise;Patient/family education;DME and/or AE instruction;Therapeutic activities;Balance  training;Modalities;Cognitive remediation/compensation;Neuromuscular education;Splinting    OT Goals(Current goals can be found in the care plan section) Acute Rehab OT Goals Patient Stated Goal: to improve functional independence OT Goal Formulation: With patient Time For Goal Achievement: 04/30/19 Potential to Achieve Goals: Fair ADL Goals Pt Will Perform Eating: sitting;with min assist;with caregiver independent in assisting Pt Will Perform Grooming: sitting;with min assist;with mod assist;with caregiver independent in assisting Additional ADL Goal #1: PT/caregiver will independently return demonstrate understanding of PROM therapeutic exercise program given a written HEP.  OT Frequency: Min 1X/week   Barriers to D/C:            Co-evaluation              AM-PAC OT "6 Clicks" Daily Activity     Outcome Measure Help from another person eating meals?: A Lot(Based on clinical reasoning. Pt with PEG tube for feeds.) Help from another person taking care of personal grooming?: A Lot Help from another person toileting, which includes using toliet, bedpan, or urinal?: Total Help from another person bathing (including washing, rinsing, drying)?: Total Help from another person to put on and taking off regular upper body clothing?: A Lot Help from another person to put on and taking off regular lower body clothing?: Total 6 Click Score: 9   End of Session Nurse Communication: Other (comment)(Pt BUE positioning with towel rolls. Pt would benefit from resting hand splints.)  Activity Tolerance: Patient tolerated treatment well Patient left: in bed;with call bell/phone within reach;with family/visitor present;Other (comment)(With prevlon boots donned at start/end of session.)  OT Visit Diagnosis: Other abnormalities of gait and mobility (R26.89);Muscle weakness (generalized) (M62.81)                Time: [id number] OT Time Calculation (min): 30 min Charges:  OT Evaluation $OT  Re-eval: 1 Re-eval OT Treatments $Therapeutic Exercise: 8-22 mins  Shara Blazing, M.S., OTR/L Ascom: [phone] 04/16/19, 5:05 PM

## 2019-04-16 NOTE — Progress Notes (Signed)
Tolerated TF overnight; no other c/o nausea; one liquid BM overnight; PEG flushes well; Bilateral UE's elevated on pillows, patient states that this has helped them stop hurting. Barbaraann Faster, RN 5:42 AM 04/16/2019

## 2019-04-16 NOTE — Progress Notes (Signed)
PROGRESS NOTE    Jamie Arias  [id number] DOB: 1956-02-19 DOA: 02/09/2019 PCP: [name], MD   Brief Narrative:  [name] a 64 y.o.white femalewith end-stage renal disease on hemodialysis, atrial fibrillation, chronic diastolic congestive heart failure, coronary artery disease status post CABG, diabetes mellitus type II, hypertension, hyperlipidemia, hypothyroidism, Covid positive in July 2020, recent nursing home stay in [place]. Prolonged hospitalization for MRSA bacteremia/sepsis and now with acute encephalopathy.  2/10: Patient seen and evaluated during hemodialysis.  Patient does respond to name.  Unable to have conversation beyond this.  2/11: Patient seen and examined.  Response to name and is able to answer simple questions.  More communicative than yesterday.   Assessment & Plan:   Principal Problem:   Sepsis due to methicillin resistant Staphylococcus aureus (MRSA) ([place]) Active Problems:   Type 2 diabetes mellitus with ESRD (end-stage renal disease) (HCC)   Atrial fibrillation, chronic (HCC)   Hypotension   Chronic diastolic CHF (congestive heart failure) (HCC)   ESRD (end stage renal disease) (HCC)   Altered level of consciousness   Decubitus ulcer of heel, bilateral, stage 2 (HCC)   MRSA bacteremia   Arm DVT (deep venous thromboembolism), acute, left (HCC)   Acute encephalopathy   Acute metabolic encephalopathy   Acute blood loss anemia   AF (paroxysmal atrial fibrillation) ([place])   Diarrhea   Oropharyngeal dysphagia   Abscess   Palliative care by specialist   Goals of care, counseling/discussion  Acute metabolic encephalopathy: -mental status improving slowly with patient saying a few words every now and then. Cannot hold meaningful conversation -MRI of brain and EEG negative. Patient remained afebrile with no leukocytosis. -B12 within normal range - IR performed LP on 03/20/19 AM: CSF shows only 8 WBC, Protein elevated at 73,  Glucose 83. Negative for Toxo. CSF Cx final -- negative.  - ID, Neurology assistance  -Palliative care consulted -SLP eval recommended continue PEG tube feeding and aspiration precautions.  Reconsult once patient's mental status improves   Sepsis secondary to MRSA bacteremia. - Right arm graft was removed on 03/04/2019. -Completed vancomycin for 6-week course on 04/01/2019.  Anemia of chronic illness. -Status post transfusion 1 unit of PRBC on 03/17/2019 - hemoglobin stable at 6.9---transfused on 04/07/19--9.5 - Iron studies consistent with anemia of chronic illness.   -B12 within normal range -Continue EPO with dialysis.  Hypotension, resolved Hypoalbuminemia -midodrine d/c'ed -blood pressure currently on the high side.    ESRDon HD(MWF). -Currently using tunneled catheter as graft was removed due to MRSA bacteremia. -Continue with scheduled dialysis per nephrology -patient has tolerated for our dialysis treatment sitting up.  Left arm DVT. -Currently on Eliquis - recent diagnosis of left arm DVT on 03/03/2019.  Hypothyroidism. Repeat TSH elevated at 17.82. -continue Increased dose of Synthroid 100. - repeat TSH in 2 to 3 weeks.  Paroxysmal A. Fib.. Currently in sinus. Amiodarone was discontinued. -continue eliquis   Type 2 diabetes. Hypoglycemic episodes --Most recent A1c in Dec 2020 was 5.3, BG during hospitalization never went above 200, has been receiving SSI with multiple hypoglycemic episodes.  No need for further fingersticks and SSI since BG within inpatient goals.  PLAN: --Discontinue all insulin and fingersticks.    Chronic diastolic heart failure. -Volume being managed by dialysis.  Bilateral decubitus ulcers.Present on admission. -Continue with wound care. -Wound care nurse to follow sacral wound; consulted again on 03/24/19   Nutrition.  PEG placed 03/03/2019. PEG tube clogged on 2/2 or 2/3, exchanged  new tube on  2/6. SLP eval done, recommended n.p.o., continue PEG tube feeding and reconsult once mental status improves.  Acute hypoxic respiratory failure.Resolved.  Patient was intubated initially, extubated on 03/05/2019. -On room air now   DVT prophylaxis: Eliquis Code Status: DNR Family Communication: None today Disposition Plan: Unclear at this time.  Per last TOC note from today Medicaid application should be approved by this weekend to cover medical bills.  Unclear at this time whether this would also cover skilled nursing facility placement.  [facility] department director aware and is reviewing disposition options.  Consultants:   Nephrology  Palliative care  Procedures:   None  Antimicrobials:   None   Subjective: Patient seen and examined Able to hold simple conversation today More alert and awake  Objective: Vitals:   04/15/19 1349 04/15/19 2053 04/16/19 0603 04/16/19 1237  BP: (!) 147/47 (!) 139/52 (!) 145/53 (!) 162/72  Pulse: 74 75 74 73  Resp:  20 16 16   Temp: 97.8 F (36.6 C) 98.1 F (36.7 C) 98.3 F (36.8 C) 98.1 F (36.7 C)  TempSrc: Oral Oral Oral Oral  SpO2: 100% 98% 100% 100%  Weight:      Height:        Intake/Output Summary (Last 24 hours) at 04/16/2019 1301 Last data filed at 04/16/2019 0500 Gross per 24 hour  Intake 1653 ml  Output 15 ml  Net 1638 ml   Filed Weights   04/14/19 1210 04/15/19 0341 04/15/19 0955  Weight: 91.8 kg 89.2 kg 87.9 kg    Examination: Constitutional: NAD, sleeping comfortably with husband napping by her side HEENT: conjunctivae and lids normal, EOMI HD cath+ CV: RRR. Distal pulses +2. No cyanosis.  RESP: CTA B/L over anterior, normal respiratory effort, on RA GI: +BS, NTND, PEG tube in place Extremities: bilateral feet in soft boots SKIN: warm, dry, extensive bruising      Data Reviewed: I have personally reviewed following labs and imaging studies  CBC: Recent Labs  Lab 04/10/19 1125 04/12/19 0943  04/13/19 0220 04/16/19 0550  WBC 4.9 4.6 4.9 4.1  HGB 8.2* 9.5* 8.7* 9.2*  HCT 25.6* 30.5* 27.8* 29.5*  MCV 98.1 99.3 98.9 98.3  PLT 207 217 212 [id number]   Basic Metabolic Panel: Recent Labs  Lab 04/12/19 0943 04/13/19 0220 04/14/19 0643 04/15/19 1012 04/16/19 0550  NA 138 136 136 133* 133*  K 3.9 4.1 4.7 5.1 5.3*  CL 98 96* 99 96* 96*  CO2 32 31 31 31 29   GLUCOSE 168* 139* 181* 164* 202*  BUN 17 25* 18 27* 22  CREATININE 2.90* 3.16* 2.33* 2.53* 2.07*  CALCIUM 8.7* 8.4* 8.6* 8.3* 8.9  PHOS 4.3 4.4 3.0 2.6 3.0   GFR: Estimated Creatinine Clearance: 29.1 mL/min (A) (by C-G formula based on SCr of 2.07 mg/dL (H)). Liver Function Tests: Recent Labs  Lab 04/12/19 0943 04/13/19 0220 04/14/19 0643 04/15/19 1012 04/16/19 0550  ALBUMIN 2.3* 2.2* 2.2* 2.1* 2.1*   No results for input(s): LIPASE, AMYLASE in the last 168 hours. No results for input(s): AMMONIA in the last 168 hours. Coagulation Profile: No results for input(s): INR, PROTIME in the last 168 hours. Cardiac Enzymes: No results for input(s): CKTOTAL, CKMB, CKMBINDEX, TROPONINI in the last 168 hours. BNP (last 3 results) No results for input(s): PROBNP in the last 8760 hours. HbA1C: No results for input(s): HGBA1C in the last 72 hours. CBG: Recent Labs  Lab 04/14/19 1120 04/14/19 1611 04/14/19 2002 04/15/19 0024 04/15/19 [place]  153* 110* 155* 149* 173*   Lipid Profile: No results for input(s): CHOL, HDL, LDLCALC, TRIG, CHOLHDL, LDLDIRECT in the last 72 hours. Thyroid Function Tests: No results for input(s): TSH, T4TOTAL, FREET4, T3FREE, THYROIDAB in the last 72 hours. Anemia Panel: No results for input(s): VITAMINB12, FOLATE, FERRITIN, TIBC, IRON, RETICCTPCT in the last 72 hours. Sepsis Labs: No results for input(s): PROCALCITON, LATICACIDVEN in the last 168 hours.  No results found for this or any previous visit (from the past 240 hour(s)).       Radiology Studies: No results  found.      Scheduled Meds: . acidophilus  2 capsule Oral BID  . alteplase  2 mg Intracatheter Once  . alteplase  2 mg Intracatheter Once  . apixaban  5 mg Oral BID  . aspirin  81 mg Per Tube Daily  . B-complex with vitamin C  1 tablet Per Tube Daily  . Chlorhexidine Gluconate Cloth  6 each Topical Q0600  . epoetin (EPOGEN/PROCRIT) injection  10,000 Units Intravenous Q M,W,F-HD  . feeding supplement (PRO-STAT SUGAR FREE 64)  30 mL Per Tube Daily  . [START ON 04/17/2019] ferrous gluconate  324 mg Oral Daily  . fluticasone  2 spray Each Nare Daily  . folic acid  1 mg Intravenous Daily  . free water  20 mL Per Tube Q4H  . levothyroxine  88 mcg Per Tube Q0600  . mouth rinse  15 mL Mouth Rinse BID  . pantoprazole sodium  40 mg Per Tube BID  . [START ON 04/17/2019] senna-docusate  1 tablet Oral QHS   Continuous Infusions: . sodium chloride 20 mL (03/23/19 1852)  . feeding supplement (OSMOLITE 1.5 CAL) 1,000 mL (04/15/19 1600)     LOS: 66 days    Time spent: 35 minutes    Sidney Ace, MD [facility] Pager [phone]  If 7PM-7AM, please contact night-coverage  04/16/2019, 1:01 PM

## 2019-04-16 NOTE — TOC Progression Note (Signed)
Transition of Care [facility]) - Progression Note    Patient Details  Name: Jamie Arias MRN: [id number] Date of Birth: January 07, 1956  Transition of Care [facility]) CM/SW [place], LCSW Phone Number: 04/16/2019, 2:50 PM  Clinical Narrative: [facility] SNF is able to offer patient a bed. Their business office has already spoken to her Medicaid worker. Patient cannot start at the HD center until Tuesday. Husband is aware and will discuss with her sons tonight. He will give CSW decision tomorrow. CSW explained that at this time this is her only option. [facility], [facility], [facility] have all declined a bed offer.     Expected Discharge Plan: [place] Barriers to Discharge: Continued Medical Work up  Expected Discharge Plan and Services Expected Discharge Plan: [place] In-house Referral: Clinical Social Work     Living arrangements for the past 2 months: Mobile Home                                       Social Determinants of Health (SDOH) Interventions    Readmission Risk Interventions Readmission Risk Prevention Plan 02/13/2019 09/15/2018  Transportation Screening - Complete  Medication Review Press photographer) Complete Complete  PCP or Specialist appointment within 3-5 days of discharge - Complete  HRI or [place] Complete Complete  SW Recovery Care/Counseling Consult Complete Complete  [place] Not Applicable Complete  Some recent data might be hidden

## 2019-04-16 NOTE — Progress Notes (Signed)
Patient accepted at [facility] 12:15. Per clinic patient can not start until Tuesday 2/16 and will require updated notes. New CXR will need to be ordered.

## 2019-04-16 NOTE — Evaluation (Signed)
Physical Therapy Re-Evaluation Patient Details Name: Jamie Arias MRN: [id number] DOB: 01/06/1956 Today's Date: 04/16/2019   History of Present Illness  Pt is a 64 y.o. female presenting to hospital 02/09/19 with AMS.  Pt admitted with MRSA sepsis and bacteremia with hypotension, toxic metabolic encephalopathy, ESRD on HD, hypokalemia, elevated troponin, a-fib, and anemia of CKD.  LUE DVT diagnosed 03/03/19.  Of note pt had been intubated but was extubated 03/04/20.  Pt s/p 2/6 upper GI endoscopy and replace PEG tube d/t clogged gastrostomy tube.  PMH includes ESRD on HD, a-fib, chronic diastolic CHF, CAD s/p CABG, DM, htn, COVID (+) July 2020.  Clinical Impression  D/t extended hospitalization, PT re-evaluation performed.  Prior to June 2020 pt was independent with ambulation but pt then with extended hospitalization and went to rehab facility.   Pt discharged home from rehab mid November and pt has been using sit to stand lift for transfers at home (pt's husband reports pt had fairly good sitting balance though).  Pt resting in bed upon PT arrival with eyes open and husband present.  Pt talking with therapist during session (occasionally requiring increased time to respond); pt's verbalization and ability to participate during session improved compared to prior sessions.  Pt participated fairly well with LE ex's but requiring rest breaks between ex's d/t general weakness and fatigue.  B heel-cords are tight and positioned in PF and B UE fingers positioned in flexion and unable to straight fully (similar to initial evaluation).  Pt would benefit from skilled PT to address noted impairments and functional limitations (see below for any additional details).  Overall generalized weakness and decreased activity tolerance noted.  Will focus on strengthening, ROM, and progressive functional mobility as appropriate.  POC reviewed and updated.  Upon hospital discharge, pt would benefit from STR.    Follow Up  Recommendations SNF    Equipment Recommendations  Hospital bed    Recommendations for Other Services OT consult     Precautions / Restrictions Precautions Precautions: Fall Precaution Comments: L IJ HD permcath; NPO; PEG tube; aspiration precautions; sacral wound; elevate R UE; elevate HOB >30 degrees Restrictions Weight Bearing Restrictions: No      Mobility  Bed Mobility               General bed mobility comments: Deferred d/t pt fatigue with ex's in bed and elevated potassium (pt not appropriate for exertional activity d/t elevated potassium)  Transfers                    Ambulation/Gait                Stairs            Wheelchair Mobility    Modified Rankin (Stroke Patients Only)       Balance                                             Pertinent Vitals/Pain Pain Assessment: 0-10 Faces Pain Scale: No hurt(4/10 B hands when attempting to straighten fingers) Pain Location: B hands/fingers Pain Descriptors / Indicators: Grimacing Pain Intervention(s): Limited activity within patient's tolerance;Monitored during session;Repositioned  Vitals (HR and O2 on room air) stable and WFL throughout treatment session.    Home Living Family/patient expects to be discharged to:: Private residence Living Arrangements: Spouse/significant other Available Help at Discharge: Family;Available 24  hours/day Type of Home: Mobile home Home Access: Stairs to enter;Ramped entrance Entrance Stairs-Rails: Right Entrance Stairs-Number of Steps: 8 Home Layout: One level Home Equipment: Walker - 2 wheels;Wheelchair - [employer];Hospital bed;Shower seat(Sit to stand lift) Additional Comments: Pt's husband reports pt was independent prior to hospitalization for COVID in June 2020; pt had prolonged hospitalization but discharged late July; came home from rehab in mid November (using sit to stand mechanical lift and hospital bed).   Has been on dialysis for 3 years (used a transport chair when going out to HD prior to June 2020).    Prior Function Level of Independence: Needs assistance   Gait / Transfers Assistance Needed: See additional comments in home living above (pt has not been ambulatory since June 2020)  ADL's / Homemaking Assistance Needed: Pt. required assist from her husband with all ADLs, and IADLs        Hand Dominance   Dominant Hand: Left    Extremity/Trunk Assessment   Upper Extremity Assessment Upper Extremity Assessment: RUE deficits/detail;LUE deficits/detail RUE Deficits / Details: unable to fully extend fingers;  R elbow flexion AAROM to 90 degrees and R elbow extension grossly 15-20 degrees short of neutral; R shoulder flexion AAROM to 80-90 degrees LUE Deficits / Details: unable to fully extend fingers; L elbow flexion AROM to 90 degrees and L elbow extension to neutral; L shoulder AAROM to 80-90 degrees    Lower Extremity Assessment Lower Extremity Assessment: LLE deficits/detail;RLE deficits/detail RLE Deficits / Details: hip flexion and knee flexion/extension ROM WFL (at least 3/5 strength); DF/PF 2-/5 strength; heelcord tight and foot positioned in PF LLE Deficits / Details: hip flexion and knee flexion/extension ROM WFL (at least 3/5 strength); DF/PF 3/5 AROM; heelcord tight and foot positioned in PF       Communication   Communication: Other (comment)(Increased time to verbalize at times)  Cognition Arousal/Alertness: Awake/alert Behavior During Therapy: Flat affect Overall Cognitive Status: Impaired/Different from baseline Area of Impairment: Following commands                       Following Commands: Follows one step commands inconsistently;Follows multi-step commands inconsistently;Follows one step commands with increased time;Follows multi-step commands with increased time              General Comments General comments (skin integrity, edema, etc.): B  fingers positioned in flexion and B ankles positioned in PF.  Nursing cleared pt for participation in physical therapy.  Pt agreeable to PT session.    Exercises General Exercises - Lower Extremity Ankle Circles/Pumps: AROM;Strengthening;Both;10 reps;Supine Short Arc Quad: AROM;Strengthening;Both;10 reps;Supine(vc's to improve technique) Heel Slides: AAROM;Strengthening;Both;10 reps;Supine Other Exercises Other Exercises: B heelcord stretch (into DF) 2x30 seconds   Assessment/Plan    PT Assessment Patient needs continued PT services  PT Problem List Decreased strength;Decreased range of motion;Decreased activity tolerance;Decreased balance;Decreased mobility;Decreased cognition;Decreased knowledge of use of DME;Pain       PT Treatment Interventions DME instruction;Functional mobility training;Therapeutic activities;Therapeutic exercise;Balance training;Patient/family education;Cognitive remediation;Neuromuscular re-education;Wheelchair mobility training    PT Goals (Current goals can be found in the Care Plan section)  Acute Rehab PT Goals Patient Stated Goal: to improve mobility PT Goal Formulation: With patient/family Time For Goal Achievement: 04/30/19 Potential to Achieve Goals: Fair    Frequency Min 2X/week   Barriers to discharge Decreased caregiver support      Co-evaluation               AM-PAC PT "6 Clicks" Mobility  Outcome Measure Help needed turning from your back to your side while in a flat bed without using bedrails?: Total Help needed moving from lying on your back to sitting on the side of a flat bed without using bedrails?: Total Help needed moving to and from a bed to a chair (including a wheelchair)?: Total Help needed standing up from a chair using your arms (e.g., wheelchair or bedside chair)?: Total Help needed to walk in hospital room?: Total Help needed climbing 3-5 steps with a railing? : Total 6 Click Score: 6    End of Session   Activity  Tolerance: Patient tolerated treatment well Patient left: in bed;with call bell/phone within reach;with bed alarm set;with family/visitor present;Other (comment)(B PRAFO's in place; B UE's elevated via pillows) Nurse Communication: Mobility status;Precautions PT Visit Diagnosis: Other abnormalities of gait and mobility (R26.89);Muscle weakness (generalized) (M62.81);Difficulty in walking, not elsewhere classified (R26.2)    Time: [id number] PT Time Calculation (min) (ACUTE ONLY): 26 min   Charges:   PT Evaluation $PT Re-evaluation: 1 Re-eval PT Treatments $Therapeutic Exercise: 8-22 mins        Leitha Bleak, PT 04/16/19, 12:30 PM

## 2019-04-16 NOTE — Progress Notes (Signed)
[facility], [place] 04/16/19  Subjective:   Hospital day # 58  Patient is overall doing fair Tolerating Tube feeds Husband reports patient is having loose stools  Renal: 02/10 0701 - 02/11 0700 In: [id number] [NG/GT:1653] Out: 1515      Objective:  Vital signs in last 24 hours:  Temp:  [97.8 F (36.6 C)-98.3 F (36.8 C)] 98.3 F (36.8 C) (02/11 0603) Pulse Rate:  [71-84] 74 (02/11 0603) Resp:  [11-20] 16 (02/11 0603) BP: (111-147)/(45-93) 145/53 (02/11 0603) SpO2:  [98 %-100 %] 100 % (02/11 0603)  Weight change: -3.9 kg Filed Weights   04/14/19 1210 04/15/19 0341 04/15/19 0955  Weight: 91.8 kg 89.2 kg 87.9 kg    Intake/Output:    Intake/Output Summary (Last 24 hours) at 04/16/2019 1052 Last data filed at 04/16/2019 0500 Gross per 24 hour  Intake 1653 ml  Output 1515 ml  Net 138 ml     Physical Exam: General:  Chronically ill-appearing,   HEENT  anicteric, moist oral mucous membranes  Pulm/lungs  shallow breathing effort, no crackles  CVS/Heart  irregular rhythm, no rub  Abdomen:   Soft, nontender, PEG tube in place  Extremities:  + dependent edema  Neurologic:   Alert and able to answer questions  Skin:  scattered Ecchymosis  Access:  PermCath Left IJ       Basic Metabolic Panel:  Recent Labs  Lab 04/12/19 0943 04/12/19 0943 04/13/19 0220 04/13/19 0220 04/14/19 0643 04/15/19 1012 04/16/19 0550  NA 138  --  136  --  136 133* 133*  K 3.9  --  4.1  --  4.7 5.1 5.3*  CL 98  --  96*  --  99 96* 96*  CO2 32  --  31  --  31 31 29   GLUCOSE 168*  --  139*  --  181* 164* 202*  BUN 17  --  25*  --  18 27* 22  CREATININE 2.90*  --  3.16*  --  2.33* 2.53* 2.07*  CALCIUM 8.7*   < > 8.4*   < > 8.6* 8.3* 8.9  PHOS 4.3  --  4.4  --  3.0 2.6 3.0   < > = values in this interval not displayed.     CBC: Recent Labs  Lab 04/10/19 1125 04/12/19 0943 04/13/19 0220 04/16/19 0550  WBC 4.9 4.6 4.9 4.1  HGB 8.2* 9.5* 8.7* 9.2*  HCT  25.6* 30.5* 27.8* 29.5*  MCV 98.1 99.3 98.9 98.3  PLT 207 217 212 177      Lab Results  Component Value Date   HEPBSAG NON REACTIVE 03/18/2019      Microbiology:  No results found for this or any previous visit (from the past 240 hour(s)).  Coagulation Studies: No results for input(s): LABPROT, INR in the last 72 hours.  Urinalysis: No results for input(s): COLORURINE, LABSPEC, PHURINE, GLUCOSEU, HGBUR, BILIRUBINUR, KETONESUR, PROTEINUR, UROBILINOGEN, NITRITE, LEUKOCYTESUR in the last 72 hours.  Invalid input(s): APPERANCEUR    Imaging: No results found.   Medications:   . sodium chloride 20 mL (03/23/19 1852)  . feeding supplement (OSMOLITE 1.5 CAL) 1,000 mL (04/15/19 1600)   . acidophilus  2 capsule Oral BID  . alteplase  2 mg Intracatheter Once  . alteplase  2 mg Intracatheter Once  . apixaban  5 mg Oral BID  . aspirin  81 mg Per Tube Daily  . B-complex with vitamin C  1 tablet Per Tube Daily  .  Chlorhexidine Gluconate Cloth  6 each Topical Q0600  . epoetin (EPOGEN/PROCRIT) injection  10,000 Units Intravenous Q M,W,F-HD  . feeding supplement (PRO-STAT SUGAR FREE 64)  30 mL Per Tube Daily  . [START ON 04/17/2019] ferrous gluconate  324 mg Oral Daily  . fluticasone  2 spray Each Nare Daily  . folic acid  1 mg Intravenous Daily  . free water  20 mL Per Tube Q4H  . levothyroxine  88 mcg Per Tube Q0600  . mouth rinse  15 mL Mouth Rinse BID  . pantoprazole sodium  40 mg Per Tube BID  . [START ON 04/17/2019] senna-docusate  1 tablet Oral QHS   sodium chloride, acetaminophen, ipratropium-albuterol, labetalol, lip balm, morphine injection, naphazoline-glycerin, ondansetron (ZOFRAN) IV, promethazine, promethazine, sodium chloride, traMADol  Assessment/ Plan:  64 y.o. female with  end-stage renal disease on hemodialysis, atrial fibrillation, chronic diastolic congestive heart failure, coronary artery disease status post CABG, diabetes mellitus type II, hypertension,  hyperlipidemia, hypothyroidism, Covid positive in July 2020, recent nursing home stay in [place], Prolonged hospitalization for MRSA bacteremia/sepsis  admitted on 02/09/2019 for ESRD (end stage renal disease) ([place]) [N18.6] Altered mental status, unspecified altered mental status type [R41.82] Chest pain, unspecified type [R07.9] Sepsis, due to unspecified organism, unspecified whether acute organ dysfunction present ([place]) [A41.9]  [facility] MWF [facility]   #End-stage renal disease with hyperkalemia Continue hemodialysis via PermCath on MWF schedule  tolerating fair Awaiting disposition to nursing home. (medicaid funding awaited as patient has used up medicare allowance) 2 K bath  #Anemia of chronic kidney disease Epogen scheduled with dialysis   iron tab with Peg tube Lab Results  Component Value Date   HGB 9.2 (L) 04/16/2019  Continue now Epogen 10,000 units IV MWF with dialysis  #Secondary hyperparathyroidism Lab Results  Component Value Date   PTH 135 (H) 02/22/2017   CALCIUM 8.9 04/16/2019   CAION 1.20 07/03/2013   PHOS 3.0 04/16/2019  not on binders at present Tube feeds Osmolite 1.5 Cal Feeding supplements prostat 30 mL daily  #Acute encephalopathy Slow improvement Able to answer a few simple questions  #MRSA sepsis Right arm AV graft removed this admission on March 04, 2019   LOS: [address] Catherina Pates 2/11/202110:52 [place], [place]  Note: This note was prepared with Dragon dictation. Any transcription errors are unintentional

## 2019-04-17 LAB — RENAL FUNCTION PANEL
Albumin: 2 g/dL — ABNORMAL LOW (ref 3.5–5.0)
Anion gap: 4 — ABNORMAL LOW (ref 5–15)
BUN: 32 mg/dL — ABNORMAL HIGH (ref 8–23)
CO2: 31 mmol/L (ref 22–32)
Calcium: 8.9 mg/dL (ref 8.9–10.3)
Chloride: 95 mmol/L — ABNORMAL LOW (ref 98–111)
Creatinine, Ser: 2.91 mg/dL — ABNORMAL HIGH (ref 0.44–1.00)
GFR calc Af Amer: 19 mL/min — ABNORMAL LOW (ref >60)
GFR calc non Af Amer: 16 mL/min — ABNORMAL LOW (ref >60)
Glucose, Bld: 153 mg/dL — ABNORMAL HIGH (ref 70–99)
Phosphorus: 3 mg/dL (ref 2.5–4.6)
Potassium: 5.9 mmol/L — ABNORMAL HIGH (ref 3.5–5.1)
Sodium: 130 mmol/L — ABNORMAL LOW (ref 135–145)

## 2019-04-17 LAB — GLUCOSE, CAPILLARY: Glucose-Capillary: 105 mg/dL — ABNORMAL HIGH (ref 70–99)

## 2019-04-17 NOTE — Plan of Care (Signed)
Continuing plan of care

## 2019-04-17 NOTE — Progress Notes (Signed)
   04/17/19 1300  Clinical Encounter Type  Visited With Patient  Visit Type Follow-up  Referral From Chaplain  Consult/Referral To Chaplain  While doing rounds, Chaplain visited with patient. Nurse was trying to administered meds, so Chaplain stood on the side praying quietly. Shortly after nurse left patient's husband came in. Husband thanked Clinical biochemist for coming by. Chaplain left giving them time together. Chaplain offered pastoral presence, empathy, and silent prayer.

## 2019-04-17 NOTE — Progress Notes (Signed)
PIV infiltrated, awaiting IV team for restart.  Dr. Priscella Mann aware that IV infiltrated and ordered to hold the IV folic acid today.

## 2019-04-17 NOTE — TOC Progression Note (Addendum)
Transition of Care [facility]) - Progression Note    Patient Details  Name: Jamie Arias MRN: [id number] Date of Birth: 1955-12-21  Transition of Care [facility]) CM/SW [place], LCSW Phone Number: 04/17/2019, 1:09 PM  Clinical Narrative:  Received call from husband this morning. He spoke with patient's sons and they do not want her placed so far away. CSW explained that this is her only SNF option. Only other option is for her to take her home with home health. CSW explained they only come out 2-3 times per week. Husband said he is unable to provide physical assistance for changing her. He will discuss with sons again. CSW explained that insurance will not pay for her to just stay here in the hospital. Husband expressed understanding.   3:04 pm: Husband called to see if [facility] has any COVID residents. CSW called admissions coordinator who confirmed they do not have any COVID positive patients at this time. Husband will notify patient's sons.  4:38 pm: Met with patient and husband at bedside. Patient oriented today and unsure what to do regarding SNF vs. Home. CSW expressed concerns for her going home at this time and advocated for SNF placement. Her husband wants her to make the decision. CSW spent about 30 minutes with them but no final decision made. Will have weekend social worker follow up tomorrow. Asked them to make decision as medical team has decided it is time to transition to the next level of care. SNF will need COVID test within 24 hours of discharge.  Expected Discharge Plan: [place] Barriers to Discharge: Continued Medical Work up  Expected Discharge Plan and Services Expected Discharge Plan: [place] In-house Referral: Clinical Social Work     Living arrangements for the past 2 months: Mobile Home                                       Social Determinants of Health (SDOH) Interventions     Readmission Risk Interventions Readmission Risk Prevention Plan 02/13/2019 09/15/2018  Transportation Screening - Complete  Medication Review Press photographer) Complete Complete  PCP or Specialist appointment within 3-5 days of discharge - Complete  HRI or [place] Complete Complete  SW Recovery Care/Counseling Consult Complete Complete  [place] Not Applicable Complete  Some recent data might be hidden

## 2019-04-17 NOTE — Care Management Important Message (Signed)
Important Message  Patient Details  Name: Jamie Arias MRN: [id number] Date of Birth: Sep 25, 1955   Medicare Important Message Given:  Yes     Dannette Barbara 04/17/2019, 1:23 PM

## 2019-04-17 NOTE — Progress Notes (Signed)
PROGRESS NOTE    Jamie Arias  [id number] DOB: 1955/08/23 DOA: 02/09/2019 PCP: Jamie Haven, MD   Brief Narrative:  [name] a 64 y.o.white femalewith end-stage renal disease on hemodialysis, atrial fibrillation, chronic diastolic congestive heart failure, coronary artery disease status post CABG, diabetes mellitus type II, hypertension, hyperlipidemia, hypothyroidism, Covid positive in July 2020, recent nursing home stay in [place]. Prolonged hospitalization for MRSA bacteremia/sepsis and now with acute encephalopathy.  2/10: Patient seen and evaluated during hemodialysis.  Patient does respond to name.  Unable to have conversation beyond this.  2/11: Patient seen and examined.  Response to name and is able to answer simple questions.  More communicative than yesterday.  2/12: Patient seen and examined during hemodialysis.  More awake and alert this morning.  Was able to hold a simple conversation.   Assessment & Plan:   Principal Problem:   Sepsis due to methicillin resistant Staphylococcus aureus (MRSA) ([place]) Active Problems:   Type 2 diabetes mellitus with ESRD (end-stage renal disease) (HCC)   Atrial fibrillation, chronic (HCC)   Hypotension   Chronic diastolic CHF (congestive heart failure) (HCC)   ESRD (end stage renal disease) (HCC)   Altered level of consciousness   Decubitus ulcer of heel, bilateral, stage 2 (HCC)   MRSA bacteremia   Arm DVT (deep venous thromboembolism), acute, left (HCC)   Acute encephalopathy   Acute metabolic encephalopathy   Acute blood loss anemia   AF (paroxysmal atrial fibrillation) ([place])   Diarrhea   Oropharyngeal dysphagia   Abscess   Palliative care by specialist   Goals of care, counseling/discussion  Acute metabolic encephalopathy: -mental status improving slowly with patient saying a few words every now and then. Cannot hold meaningful conversation -MRI of brain and EEG negative. Patient remained afebrile  with no leukocytosis. -B12 within normal range - IR performed LP on 03/20/19 AM: CSF shows only 8 WBC, Protein elevated at 73, Glucose 83. Negative for Toxo. CSF Cx final -- negative.  - ID, Neurology assistance  -Palliative care consulted -SLP eval recommended continue PEG tube feeding and aspiration precautions.  Reconsult once patient's mental status improves   Sepsis secondary to MRSA bacteremia. - Right arm graft was removed on 03/04/2019. -Completed vancomycin for 6-week course on 04/01/2019.  Anemia of chronic illness. -Status post transfusion 1 unit of PRBC on 03/17/2019 - hemoglobin stable at 6.9---transfused on 04/07/19--9.5 - Iron studies consistent with anemia of chronic illness.   -B12 within normal range -Continue EPO with dialysis.  Hypotension, resolved Hypoalbuminemia -midodrine d/c'ed -blood pressure currently on the high side.    ESRDon HD(MWF). -Currently using tunneled catheter as graft was removed due to MRSA bacteremia. -Continue with scheduled dialysis per nephrology -patient has tolerated for our dialysis treatment sitting up.  Left arm DVT. -Currently on Eliquis - recent diagnosis of left arm DVT on 03/03/2019.  Hypothyroidism. Repeat TSH elevated at 17.82. -continue Increased dose of Synthroid 100. - repeat TSH in 2 to 3 weeks.  Paroxysmal A. Fib.. Currently in sinus. Amiodarone was discontinued. -continue eliquis   Type 2 diabetes. Hypoglycemic episodes --Most recent A1c in Dec 2020 was 5.3, BG during hospitalization never went above 200, has been receiving SSI with multiple hypoglycemic episodes.  No need for further fingersticks and SSI since BG within inpatient goals.  PLAN: --Discontinue all insulin and fingersticks.    Chronic diastolic heart failure. -Volume being managed by dialysis.  Bilateral decubitus ulcers.Present on admission. -Continue with wound care. -Wound care nurse  to follow sacral  wound; consulted again on 03/24/19   Nutrition.  PEG placed 03/03/2019. PEG tube clogged on 2/2 or 2/3, exchanged new tube on 2/6. SLP eval done, recommended n.p.o., continue PEG tube feeding and reconsult once mental status improves.  Acute hypoxic respiratory failure.Resolved.  Patient was intubated initially, extubated on 03/05/2019. -On room air now   DVT prophylaxis: Eliquis Code Status: DNR Family Communication: None today Disposition Plan: Plan for dispo to skilled nursing facility.  Her TOC Education officer, museum we have a facility that may be willing to accept her symptoms this weekend.  However the family is reluctant given that she may be placed far away from home.  Social work explained that this may be her only option apart from going home with home health which will only provide 2-3 instances of in-home care per week.  Difficult disposition, we are running out of options quickly.  Consultants:   Nephrology  Palliative care  Procedures:   None  Antimicrobials:   None   Subjective: Patient seen and examined Able to hold simple conversation today More alert and awake  Objective: Vitals:   04/17/19 1130 04/17/19 1145 04/17/19 1204 04/17/19 1313  BP: (!) 132/50 (!) 122/48 (!) 122/51 (!) 148/56  Pulse: 74 73  71  Resp: 14 16    Temp:   98.2 F (36.8 C) 98.4 F (36.9 C)  TempSrc:   Oral Oral  SpO2:   97% 95%  Weight:      Height:        Intake/Output Summary (Last 24 hours) at 04/17/2019 1329 Last data filed at 04/17/2019 1316 Gross per 24 hour  Intake 600 ml  Output 1500 ml  Net -900 ml   Filed Weights   04/14/19 1210 04/15/19 0341 04/15/19 0955  Weight: 91.8 kg 89.2 kg 87.9 kg    Examination: Constitutional: NAD, sleeping comfortably with husband napping by her side HEENT: conjunctivae and lids normal, EOMI HD cath+ CV: RRR. Distal pulses +2. No cyanosis.  RESP: CTA B/L over anterior, normal respiratory effort, on RA GI: +BS, NTND, PEG tube in  place Extremities: bilateral feet in soft boots SKIN: warm, dry, extensive bruising      Data Reviewed: I have personally reviewed following labs and imaging studies  CBC: Recent Labs  Lab 04/12/19 0943 04/13/19 0220 04/16/19 0550  WBC 4.6 4.9 4.1  HGB 9.5* 8.7* 9.2*  HCT 30.5* 27.8* 29.5*  MCV 99.3 98.9 98.3  PLT 217 212 [id number]   Basic Metabolic Panel: Recent Labs  Lab 04/13/19 0220 04/14/19 0643 04/15/19 1012 04/16/19 0550 04/17/19 0905  NA 136 136 133* 133* 130*  K 4.1 4.7 5.1 5.3* 5.9*  CL 96* 99 96* 96* 95*  CO2 31 31 31 29 31   GLUCOSE 139* 181* 164* 202* 153*  BUN 25* 18 27* 22 32*  CREATININE 3.16* 2.33* 2.53* 2.07* 2.91*  CALCIUM 8.4* 8.6* 8.3* 8.9 8.9  PHOS 4.4 3.0 2.6 3.0 3.0   GFR: Estimated Creatinine Clearance: 20.7 mL/min (A) (by C-G formula based on SCr of 2.91 mg/dL (H)). Liver Function Tests: Recent Labs  Lab 04/13/19 0220 04/14/19 0643 04/15/19 1012 04/16/19 0550 04/17/19 0905  ALBUMIN 2.2* 2.2* 2.1* 2.1* 2.0*   No results for input(s): LIPASE, AMYLASE in the last 168 hours. No results for input(s): AMMONIA in the last 168 hours. Coagulation Profile: No results for input(s): INR, PROTIME in the last 168 hours. Cardiac Enzymes: No results for input(s): CKTOTAL, CKMB, CKMBINDEX, TROPONINI in the last  168 hours. BNP (last 3 results) No results for input(s): PROBNP in the last 8760 hours. HbA1C: No results for input(s): HGBA1C in the last 72 hours. CBG: Recent Labs  Lab 04/14/19 1611 04/14/19 2002 04/15/19 0024 04/15/19 0747 04/17/19 0515  GLUCAP 110* 155* 149* 173* 105*   Lipid Profile: No results for input(s): CHOL, HDL, LDLCALC, TRIG, CHOLHDL, LDLDIRECT in the last 72 hours. Thyroid Function Tests: No results for input(s): TSH, T4TOTAL, FREET4, T3FREE, THYROIDAB in the last 72 hours. Anemia Panel: No results for input(s): VITAMINB12, FOLATE, FERRITIN, TIBC, IRON, RETICCTPCT in the last 72 hours. Sepsis Labs: No results for  input(s): PROCALCITON, LATICACIDVEN in the last 168 hours.  No results found for this or any previous visit (from the past 240 hour(s)).       Radiology Studies: DG Chest Port 1 View  Result Date: 04/16/2019 CLINICAL DATA:  In stage renal disease EXAM: PORTABLE CHEST 1 VIEW COMPARISON:  03/04/2019 FINDINGS: Left dialysis catheter in place, unchanged. Prior CABG. Cardiomegaly. Bibasilar opacities, left greater than right could reflect atelectasis or infiltrates. No effusions or acute bony abnormality. IMPRESSION: Bibasilar atelectasis or infiltrates, left greater than right. Cardiomegaly. Electronically Signed   By: Rolm Baptise M.D.   On: 04/16/2019 19:05        Scheduled Meds: . acidophilus  2 capsule Oral BID  . alteplase  2 mg Intracatheter Once  . alteplase  2 mg Intracatheter Once  . apixaban  5 mg Oral BID  . aspirin  81 mg Per Tube Daily  . B-complex with vitamin C  1 tablet Per Tube Daily  . Chlorhexidine Gluconate Cloth  6 each Topical Q0600  . epoetin (EPOGEN/PROCRIT) injection  10,000 Units Intravenous Q M,W,F-HD  . feeding supplement (PRO-STAT SUGAR FREE 64)  30 mL Per Tube Daily  . ferrous gluconate  324 mg Oral Daily  . fluticasone  2 spray Each Nare Daily  . folic acid  1 mg Intravenous Daily  . free water  20 mL Per Tube Q4H  . levothyroxine  88 mcg Per Tube Q0600  . mouth rinse  15 mL Mouth Rinse BID  . pantoprazole sodium  40 mg Per Tube BID  . senna-docusate  1 tablet Oral QHS   Continuous Infusions: . sodium chloride 20 mL (03/23/19 1852)  . feeding supplement (OSMOLITE 1.5 CAL) 1,000 mL (04/15/19 1600)     LOS: 67 days    Time spent: 35 minutes    Sidney Ace, MD [facility] Pager [phone]  If 7PM-7AM, please contact night-coverage  04/17/2019, 1:29 PM

## 2019-04-17 NOTE — Progress Notes (Signed)
OT Cancellation Note  Patient Details Name: Jamie Arias MRN: [id number] DOB: Jun 23, 1955   Cancelled Treatment:    Reason Eval/Treat Not Completed: Medical issues which prohibited therapy;Patient at procedure or test/ unavailable. Pt receiving dialysis. Per chart, also noting pt with K+ of 5.9 this am. Will continue to follow and re-attempt when available and medically appropriate.   [name], MPH, MS, OTR/L ascom [phone] 04/17/19, 9:49 AM

## 2019-04-17 NOTE — Progress Notes (Signed)
[place], [place] 04/17/19  Subjective:   Hospital day # 21  Patient is overall doing fair.  Seen during HD. tolerating well Dialysis in recliner chair   HEMODIALYSIS FLOWSHEET:  Blood Flow Rate (mL/min): 350 mL/min Arterial Pressure (mmHg): -130 mmHg Venous Pressure (mmHg): 80 mmHg Transmembrane Pressure (mmHg): 70 mmHg Ultrafiltration Rate (mL/min): 700 mL/min Dialysate Flow Rate (mL/min): 600 ml/min Conductivity: Machine : 15.3 Conductivity: Machine : 15.3 Dialysis Fluid Bolus: Normal Saline Bolus Amount (mL): 250 mL Dialysate Change: 2K      Objective:  Vital signs in last 24 hours:  Temp:  [97.6 F (36.4 C)-98.6 F (37 C)] 98.4 F (36.9 C) (02/12 1313) Pulse Rate:  [70-77] 71 (02/12 1313) Resp:  [10-20] 16 (02/12 1145) BP: (122-170)/(37-116) 148/56 (02/12 1313) SpO2:  [73 %-97 %] 95 % (02/12 1313)  Weight change:  Filed Weights   04/14/19 1210 04/15/19 0341 04/15/19 0955  Weight: 91.8 kg 89.2 kg 87.9 kg    Intake/Output:    Intake/Output Summary (Last 24 hours) at 04/17/2019 1330 Last data filed at 04/17/2019 1316 Gross per 24 hour  Intake 600 ml  Output 1500 ml  Net -900 ml     Physical Exam: General:  Chronically ill-appearing,   HEENT  anicteric, moist oral mucous membranes  Pulm/lungs  shallow breathing effort, no crackles  CVS/Heart  irregular rhythm, no rub  Abdomen:   Soft, nontender, PEG tube in place  Extremities:  + dependent edema  Neurologic:   Alert and able to answer questions  Skin:  scattered Ecchymosis  Access:  PermCath Left IJ       Basic Metabolic Panel:  Recent Labs  Lab 04/13/19 0220 04/13/19 0220 04/14/19 0643 04/14/19 0643 04/15/19 1012 04/16/19 0550 04/17/19 0905  NA 136  --  136  --  133* 133* 130*  K 4.1  --  4.7  --  5.1 5.3* 5.9*  CL 96*  --  99  --  96* 96* 95*  CO2 31  --  31  --  31 29 31   GLUCOSE 139*  --  181*  --  164* 202* 153*  BUN 25*  --  18  --  27* 22 32*   CREATININE 3.16*  --  2.33*  --  2.53* 2.07* 2.91*  CALCIUM 8.4*   < > 8.6*   < > 8.3* 8.9 8.9  PHOS 4.4  --  3.0  --  2.6 3.0 3.0   < > = values in this interval not displayed.     CBC: Recent Labs  Lab 04/12/19 0943 04/13/19 0220 04/16/19 0550  WBC 4.6 4.9 4.1  HGB 9.5* 8.7* 9.2*  HCT 30.5* 27.8* 29.5*  MCV 99.3 98.9 98.3  PLT 217 212 177      Lab Results  Component Value Date   HEPBSAG NON REACTIVE 04/16/2019      Microbiology:  No results found for this or any previous visit (from the past 240 hour(s)).  Coagulation Studies: No results for input(s): LABPROT, INR in the last 72 hours.  Urinalysis: No results for input(s): COLORURINE, LABSPEC, PHURINE, GLUCOSEU, HGBUR, BILIRUBINUR, KETONESUR, PROTEINUR, UROBILINOGEN, NITRITE, LEUKOCYTESUR in the last 72 hours.  Invalid input(s): APPERANCEUR    Imaging: DG Chest Port 1 View  Result Date: 04/16/2019 CLINICAL DATA:  In stage renal disease EXAM: PORTABLE CHEST 1 VIEW COMPARISON:  03/04/2019 FINDINGS: Left dialysis catheter in place, unchanged. Prior CABG. Cardiomegaly. Bibasilar opacities, left greater than right could reflect atelectasis or  infiltrates. No effusions or acute bony abnormality. IMPRESSION: Bibasilar atelectasis or infiltrates, left greater than right. Cardiomegaly. Electronically Signed   By: Rolm Baptise M.D.   On: 04/16/2019 19:05     Medications:   . sodium chloride 20 mL (03/23/19 1852)  . feeding supplement (OSMOLITE 1.5 CAL) 1,000 mL (04/15/19 1600)   . acidophilus  2 capsule Oral BID  . alteplase  2 mg Intracatheter Once  . alteplase  2 mg Intracatheter Once  . apixaban  5 mg Oral BID  . aspirin  81 mg Per Tube Daily  . B-complex with vitamin C  1 tablet Per Tube Daily  . Chlorhexidine Gluconate Cloth  6 each Topical Q0600  . epoetin (EPOGEN/PROCRIT) injection  10,000 Units Intravenous Q M,W,F-HD  . feeding supplement (PRO-STAT SUGAR FREE 64)  30 mL Per Tube Daily  . ferrous  gluconate  324 mg Oral Daily  . fluticasone  2 spray Each Nare Daily  . folic acid  1 mg Intravenous Daily  . free water  20 mL Per Tube Q4H  . levothyroxine  88 mcg Per Tube Q0600  . mouth rinse  15 mL Mouth Rinse BID  . pantoprazole sodium  40 mg Per Tube BID  . senna-docusate  1 tablet Oral QHS   sodium chloride, acetaminophen, ipratropium-albuterol, labetalol, lip balm, morphine injection, naphazoline-glycerin, ondansetron (ZOFRAN) IV, promethazine, promethazine, sodium chloride, traMADol  Assessment/ Plan:  64 y.o. female with  end-stage renal disease on hemodialysis, atrial fibrillation, chronic diastolic congestive heart failure, coronary artery disease status post CABG, diabetes mellitus type II, hypertension, hyperlipidemia, hypothyroidism, Covid positive in July 2020, recent nursing home stay in [place], Prolonged hospitalization for MRSA bacteremia/sepsis  admitted on 02/09/2019 for ESRD (end stage renal disease) ([place]) [N18.6] Altered mental status, unspecified altered mental status type [R41.82] Chest pain, unspecified type [R07.9] Sepsis, due to unspecified organism, unspecified whether acute organ dysfunction present ([place]) [A41.9]  [facility] MWF [facility]   #End-stage renal disease with hyperkalemia Continue hemodialysis via PermCath on MWF schedule  tolerating fair Awaiting disposition to nursing home.     #Anemia of chronic kidney disease Epogen scheduled with dialysis   iron tab with Peg tube Lab Results  Component Value Date   HGB 9.2 (L) 04/16/2019  Continue now Epogen 10,000 units IV MWF with dialysis  #Secondary hyperparathyroidism Lab Results  Component Value Date   PTH 135 (H) 02/22/2017   CALCIUM 8.9 04/17/2019   CAION 1.20 07/03/2013   PHOS 3.0 04/17/2019  not on binders at present Tube feeds Osmolite 1.5 Cal Feeding supplements prostat 30 mL daily  #Acute encephalopathy Slow improvement Able to answer a few simple questions  #MRSA  sepsis Right arm AV graft removed this admission on March 04, 2019   LOS: [address] 2/12/20211:30 PM  [place], [place]  Note: This note was prepared with Dragon dictation. Any transcription errors are unintentional

## 2019-04-18 LAB — RENAL FUNCTION PANEL
Albumin: 2.1 g/dL — ABNORMAL LOW (ref 3.5–5.0)
Anion gap: 6 (ref 5–15)
BUN: 19 mg/dL (ref 8–23)
CO2: 29 mmol/L (ref 22–32)
Calcium: 8.5 mg/dL — ABNORMAL LOW (ref 8.9–10.3)
Chloride: 99 mmol/L (ref 98–111)
Creatinine, Ser: 2.01 mg/dL — ABNORMAL HIGH (ref 0.44–1.00)
GFR calc Af Amer: 29 mL/min — ABNORMAL LOW (ref >60)
GFR calc non Af Amer: 25 mL/min — ABNORMAL LOW (ref >60)
Glucose, Bld: 173 mg/dL — ABNORMAL HIGH (ref 70–99)
Phosphorus: 2.7 mg/dL (ref 2.5–4.6)
Potassium: 4.8 mmol/L (ref 3.5–5.1)
Sodium: 134 mmol/L — ABNORMAL LOW (ref 135–145)

## 2019-04-18 MED ORDER — FOLIC ACID 1 MG PO TABS
1.0000 mg | ORAL_TABLET | Freq: Every day | ORAL | Status: DC
  Administered 2019-04-18 – 2019-04-19 (×2): 1 mg via ORAL
  Filled 2019-04-18 (×2): qty 1

## 2019-04-18 NOTE — Progress Notes (Signed)
PROGRESS NOTE    Jamie Arias  [id number] DOB: 1955/09/22 DOA: 02/09/2019 PCP: [name], MD   Brief Narrative:  [name] a 64 y.o.white femalewith end-stage renal disease on hemodialysis, atrial fibrillation, chronic diastolic congestive heart failure, coronary artery disease status post CABG, diabetes mellitus type II, hypertension, hyperlipidemia, hypothyroidism, Covid positive in July 2020, recent nursing home stay in [place]. Prolonged hospitalization for MRSA bacteremia/sepsis and now with acute encephalopathy.  2/10: Patient seen and evaluated during hemodialysis.  Patient does respond to name.  Unable to have conversation beyond this.  2/11: Patient seen and examined.  Response to name and is able to answer simple questions.  More communicative than yesterday.  2/12: Patient seen and examined during hemodialysis.  More awake and alert this morning.  Was able to hold a simple conversation.  2/13: Patient seen and examined.  Alert this morning and able to hold a conversation.  Social work had a lengthy conversation with the patient and husband at bedside yesterday.  He still not come to a decision in regards to disposition.  I again reiterated my strong recommendations with the patient consider skilled nursing facility placement even if it is a facility for from home.  I do not feel at this time she would be able to recover effectively at home and thus increasing her likelihood of readmission.   Assessment & Plan:   Principal Problem:   Sepsis due to methicillin resistant Staphylococcus aureus (MRSA) ([facility]) Active Problems:   Type 2 diabetes mellitus with ESRD (end-stage renal disease) (HCC)   Atrial fibrillation, chronic (HCC)   Hypotension   Chronic diastolic CHF (congestive heart failure) (HCC)   ESRD (end stage renal disease) (HCC)   Altered level of consciousness   Decubitus ulcer of heel, bilateral, stage 2 (HCC)   MRSA bacteremia   Arm DVT  (deep venous thromboembolism), acute, left (HCC)   Acute encephalopathy   Acute metabolic encephalopathy   Acute blood loss anemia   AF (paroxysmal atrial fibrillation) (HCC)   Diarrhea   Oropharyngeal dysphagia   Abscess   Palliative care by specialist   Goals of care, counseling/discussion  Acute metabolic encephalopathy:  -mental status improving slowly with patient saying a few words every now and then.  -Cannot hold meaningful conversation -MRI of brain and EEG negative. Patient remained afebrile with no leukocytosis. -B12 within normal range - IR performed LP on 03/20/19 AM: CSF shows only 8 WBC, Protein elevated at 73, Glucose 83. Negative for Toxo. CSF Cx final -- negative.  - ID, Neurology assistance  -Palliative care consulted -SLP eval recommended continue PEG tube feeding and aspiration precautions.   -Reconsult once patient's mental status improves.  This may have to be deferred to skilled nursing facility should the patient and family agree   Sepsis secondary to MRSA bacteremia.,  Resolved - Right arm graft was removed on 03/04/2019. -Completed vancomycin for 6-week course on 04/01/2019.  Anemia of chronic illness. -Status post transfusion 1 unit of PRBC on 03/17/2019 - hemoglobin stable at 6.9---transfused on 04/07/19--9.5 - Iron studies consistent with anemia of chronic illness.   -B12 within normal range -Continue EPO with dialysis.  Hypotension, resolved Hypoalbuminemia -midodrine d/c'ed -blood pressure currently on the high side.    ESRDon HD(MWF). -Currently using tunneled catheter as graft was removed due to MRSA bacteremia. -Continue with scheduled dialysis per nephrology -patient has tolerated for our dialysis treatment sitting up.  Left arm DVT. -Currently on Eliquis - recent diagnosis of  left arm DVT on 03/03/2019.  Hypothyroidism. Repeat TSH elevated at 17.82. -continue Increased dose of Synthroid 100. - repeat TSH in  2 to 3 weeks.  Paroxysmal A. Fib.. Currently in sinus. Amiodarone was discontinued. -continue eliquis   Type 2 diabetes. Hypoglycemic episodes --Most recent A1c in Dec 2020 was 5.3, BG during hospitalization never went above 200, has been receiving SSI with multiple hypoglycemic episodes.  No need for further fingersticks and SSI since BG within inpatient goals.  PLAN: --Discontinue all insulin and fingersticks.    Chronic diastolic heart failure. -Volume being managed by dialysis.  Bilateral decubitus ulcers.Present on admission. -Continue with wound care. -Wound care nurse to follow sacral wound; consulted again on 03/24/19   Nutrition.  PEG placed 03/03/2019. PEG tube clogged on 2/2 or 2/3, exchanged new tube on 2/6. SLP eval done, recommended n.p.o., continue PEG tube feeding and reconsult once mental status improves.  Acute hypoxic respiratory failure.Resolved.  Patient was intubated initially, extubated on 03/05/2019. -On room air now   DVT prophylaxis: Eliquis Code Status: DNR Family Communication: None today Disposition Plan: Unclear at this time.  Social work had a lengthy conversation with the patient and husband at bedside on 04/17/2019.  Still no decision in regards to placement and disposition.  I reiterate my strong recommendation for skilled nursing facility placement even if it is a facility for from home.  Her question to effectively rehabilitate and home environment is greatly in question.    Request social work follow-up today for a decision.    Consultants:   Nephrology  Palliative care  Procedures:   None  Antimicrobials:   None   Subjective: Patient seen and examined Able to hold simple conversation today More alert and awake  Objective: Vitals:   04/17/19 1313 04/17/19 2119 04/18/19 0450 04/18/19 0900  BP: (!) 148/56 (!) 144/46 (!) 145/46 (!) 153/66  Pulse: 71 69 80 78  Resp:  18 16   Temp: 98.4 F (36.9 C) 98.4 F (36.9  C) 98.4 F (36.9 C) 98.5 F (36.9 C)  TempSrc: Oral Oral Oral Oral  SpO2: 95% 100% 95%   Weight:      Height:        Intake/Output Summary (Last 24 hours) at 04/18/2019 1023 Last data filed at 04/18/2019 1002 Gross per 24 hour  Intake 1165 ml  Output 1500 ml  Net -335 ml   Filed Weights   04/14/19 1210 04/15/19 0341 04/15/19 0955  Weight: 91.8 kg 89.2 kg 87.9 kg    Examination: Constitutional: NAD, sleeping comfortably with husband napping by her side HEENT: conjunctivae and lids normal, EOMI HD cath+ CV: RRR. Distal pulses +2. No cyanosis.  RESP: CTA B/L over anterior, normal respiratory effort, on RA GI: +BS, NTND, PEG tube in place Extremities: bilateral feet in soft boots SKIN: warm, dry, extensive bruising      Data Reviewed: I have personally reviewed following labs and imaging studies  CBC: Recent Labs  Lab 04/12/19 0943 04/13/19 0220 04/16/19 0550  WBC 4.6 4.9 4.1  HGB 9.5* 8.7* 9.2*  HCT 30.5* 27.8* 29.5*  MCV 99.3 98.9 98.3  PLT 217 212 [id number]   Basic Metabolic Panel: Recent Labs  Lab 04/14/19 0643 04/15/19 1012 04/16/19 0550 04/17/19 0905 04/18/19 0422  NA 136 133* 133* 130* 134*  K 4.7 5.1 5.3* 5.9* 4.8  CL 99 96* 96* 95* 99  CO2 31 31 29 31 29   GLUCOSE 181* 164* 202* 153* 173*  BUN 18 27* 22 32*  19  CREATININE 2.33* 2.53* 2.07* 2.91* 2.01*  CALCIUM 8.6* 8.3* 8.9 8.9 8.5*  PHOS 3.0 2.6 3.0 3.0 2.7   GFR: Estimated Creatinine Clearance: 30 mL/min (A) (by C-G formula based on SCr of 2.01 mg/dL (H)). Liver Function Tests: Recent Labs  Lab 04/14/19 0643 04/15/19 1012 04/16/19 0550 04/17/19 0905 04/18/19 0422  ALBUMIN 2.2* 2.1* 2.1* 2.0* 2.1*   No results for input(s): LIPASE, AMYLASE in the last 168 hours. No results for input(s): AMMONIA in the last 168 hours. Coagulation Profile: No results for input(s): INR, PROTIME in the last 168 hours. Cardiac Enzymes: No results for input(s): CKTOTAL, CKMB, CKMBINDEX, TROPONINI in the last  168 hours. BNP (last 3 results) No results for input(s): PROBNP in the last 8760 hours. HbA1C: No results for input(s): HGBA1C in the last 72 hours. CBG: Recent Labs  Lab 04/14/19 1611 04/14/19 2002 04/15/19 0024 04/15/19 0747 04/17/19 0515  GLUCAP 110* 155* 149* 173* 105*   Lipid Profile: No results for input(s): CHOL, HDL, LDLCALC, TRIG, CHOLHDL, LDLDIRECT in the last 72 hours. Thyroid Function Tests: No results for input(s): TSH, T4TOTAL, FREET4, T3FREE, THYROIDAB in the last 72 hours. Anemia Panel: No results for input(s): VITAMINB12, FOLATE, FERRITIN, TIBC, IRON, RETICCTPCT in the last 72 hours. Sepsis Labs: No results for input(s): PROCALCITON, LATICACIDVEN in the last 168 hours.  No results found for this or any previous visit (from the past 240 hour(s)).       Radiology Studies: DG Chest Port 1 View  Result Date: 04/16/2019 CLINICAL DATA:  In stage renal disease EXAM: PORTABLE CHEST 1 VIEW COMPARISON:  03/04/2019 FINDINGS: Left dialysis catheter in place, unchanged. Prior CABG. Cardiomegaly. Bibasilar opacities, left greater than right could reflect atelectasis or infiltrates. No effusions or acute bony abnormality. IMPRESSION: Bibasilar atelectasis or infiltrates, left greater than right. Cardiomegaly. Electronically Signed   By: Rolm Baptise M.D.   On: 04/16/2019 19:05        Scheduled Meds: . acidophilus  2 capsule Oral BID  . alteplase  2 mg Intracatheter Once  . alteplase  2 mg Intracatheter Once  . apixaban  5 mg Oral BID  . aspirin  81 mg Per Tube Daily  . B-complex with vitamin C  1 tablet Per Tube Daily  . Chlorhexidine Gluconate Cloth  6 each Topical Q0600  . epoetin (EPOGEN/PROCRIT) injection  10,000 Units Intravenous Q M,W,F-HD  . feeding supplement (PRO-STAT SUGAR FREE 64)  30 mL Per Tube Daily  . ferrous gluconate  324 mg Oral Daily  . fluticasone  2 spray Each Nare Daily  . folic acid  1 mg Oral Daily  . free water  20 mL Per Tube Q4H  .  levothyroxine  88 mcg Per Tube Q0600  . mouth rinse  15 mL Mouth Rinse BID  . pantoprazole sodium  40 mg Per Tube BID  . senna-docusate  1 tablet Oral QHS   Continuous Infusions: . sodium chloride 20 mL (03/23/19 1852)  . feeding supplement (OSMOLITE 1.5 CAL) 1,000 mL (04/15/19 1600)     LOS: 68 days    Time spent: 35 minutes    Sidney Ace, MD [facility] Pager [phone]  If 7PM-7AM, please contact night-coverage  04/18/2019, 10:23 AM

## 2019-04-18 NOTE — Progress Notes (Signed)
Occupational Therapy Treatment Patient Details Name: Jamie Arias MRN: [id number] DOB: 1955-05-20 Today's Date: 04/18/2019    History of present illness Pt is a 64 y.o. female presenting to hospital 02/09/19 with AMS.  Pt admitted with MRSA sepsis and bacteremia with hypotension, toxic metabolic encephalopathy, ESRD on HD, hypokalemia, elevated troponin, a-fib, and anemia of CKD.  LUE DVT diagnosed 03/03/19.  Of note pt had been intubated but was extubated 03/04/20.  Pt s/p 2/6 upper GI endoscopy and replace PEG tube d/t clogged gastrostomy tube.  PMH includes ESRD on HD, a-fib, chronic diastolic CHF, CAD s/p CABG, DM, htn, COVID (+) July 2020.   OT comments  Jamie Arias was seen for OT treatment this date. Upon arrival to room pt sleeping soundly with caregiver present at bedside. Pt wakes easily to VCs, and acknowledges author in room. Pt agreeable to BUE there-ex and is observed to participate throughout attempting AAROM, however requiring hear PROM for some movements including wrist flexion/extension, radial/ulnar deviation, and finger extension this date. Pt continues to maintain B hands in a fisted position with elbows slightly flexed and wrists neutral while at rest. Pt able to achieve increased wrist flexion/extension as well as increased finger extension this date however she endorses increased pain with finger extension. Continue to recommend bilateral resting hand splints to support neutral positioning of pt fingers, hands, and wrists as well as promote improved functional use of BUE. Pt progressing toward goals and continues to benefit from skilled OT services to maximize return to PLOF and minimize risk of future falls, injury, caregiver burden, and readmission. Will continue to follow POC. Discharge recommendation remains appropriate.    Follow Up Recommendations  SNF    Equipment Recommendations       Recommendations for Other Services      Precautions / Restrictions  Precautions Precautions: Fall Precaution Comments: L IJ HD permcath; NPO; PEG tube; aspiration precautions; sacral wound; elevate R UE; elevate HOB >30 degrees Restrictions Weight Bearing Restrictions: No       Mobility Bed Mobility Overal bed mobility: Needs Assistance                Transfers Overall transfer level: Needs assistance               General transfer comment: Deferred for pt safety/comfort.    Balance                                           ADL either performed or assessed with clinical judgement   ADL Overall ADL's : Needs assistance/impaired                                       General ADL Comments: Pt continues to require max to total assist for all ADL management at bed level. She has a PEG tube for tube feeding. Is using a Hoyer lift for transfer to/from dialysis chair, but otherwise does not engage in functional transfers.     Vision       Perception     Praxis      Cognition Arousal/Alertness: Awake/alert Behavior During Therapy: Flat affect Overall Cognitive Status: Impaired/Different from baseline  General Comments: does visually track and acknowledge therapist/husband, verbally responds (appropriately) to questions; increased time for processing.        Exercises Other Exercises Other Exercises: AAROM (near PROM however pt can be felt assisting t/o most exercises) BUE shoulder, elbow, wrist, and figners within pt tolerance. x5 reps each side with gentle prolonged stretch at fingers on B hands. Husband educated throughout. Other Exercises: Bilat UEs propped on pillows for edema control; towel rolls placed under palms for more neutral positioning.   Shoulder Instructions       General Comments      Pertinent Vitals/ Pain       Pain Assessment: No/denies pain Faces Pain Scale: Hurts a little bit Pain Location: Grimacing noted with BUE  mobility. Pain Descriptors / Indicators: Grimacing;Sharp Pain Intervention(s): Limited activity within patient's tolerance;Monitored during session;Repositioned  Home Living                                          Prior Functioning/Environment              Frequency  Min 1X/week        Progress Toward Goals  OT Goals(current goals can now be found in the care plan section)  Progress towards OT goals: Progressing toward goals  Acute Rehab OT Goals Patient Stated Goal: to improve functional independence OT Goal Formulation: With patient Time For Goal Achievement: 04/30/19 Potential to Achieve Goals: [place] Discharge plan remains appropriate;Frequency remains appropriate    Co-evaluation                 AM-PAC OT "6 Clicks" Daily Activity     Outcome Measure   Help from another person eating meals?: A Lot Help from another person taking care of personal grooming?: A Lot Help from another person toileting, which includes using toliet, bedpan, or urinal?: Total Help from another person bathing (including washing, rinsing, drying)?: Total Help from another person to put on and taking off regular upper body clothing?: A Lot Help from another person to put on and taking off regular lower body clothing?: Total 6 Click Score: 9    End of Session    OT Visit Diagnosis: Other abnormalities of gait and mobility (R26.89);Muscle weakness (generalized) (M62.81)   Activity Tolerance Patient tolerated treatment well   Patient Left in bed;with call bell/phone within reach;with family/visitor present;Other (comment)   Nurse Communication          [id number] OT Time Calculation (min): 23 min  Charges: OT General Charges $OT Visit: 1 Visit OT Treatments $Therapeutic Exercise: 23-37 mins  Shara Blazing, M.S., OTR/L Ascom: [phone] 04/18/19, 2:40 PM

## 2019-04-18 NOTE — Progress Notes (Signed)
Talking more. Talking about family and where her children live. States"Can I please have some water, I am so thirsty". Oral care given. Swallows ice chips fine.

## 2019-04-18 NOTE — Progress Notes (Signed)
[facility], [place] 04/18/19  Subjective:  Patient awake, alert, and conversant today. Underwent dialysis treatment successfully yesterday.   Objective:  Vital signs in last 24 hours:  Temp:  [98.4 F (36.9 C)-98.5 F (36.9 C)] 98.4 F (36.9 C) (02/13 1116) Pulse Rate:  [69-80] 79 (02/13 1116) Resp:  [15-18] 15 (02/13 1116) BP: (144-153)/(46-66) 146/54 (02/13 1116) SpO2:  [95 %-100 %] 95 % (02/13 1116)  Weight change:  Filed Weights   04/14/19 1210 04/15/19 0341 04/15/19 0955  Weight: 91.8 kg 89.2 kg 87.9 kg    Intake/Output:    Intake/Output Summary (Last 24 hours) at 04/18/2019 1600 Last data filed at 04/18/2019 1454 Gross per 24 hour  Intake 885 ml  Output 0 ml  Net 885 ml     Physical Exam: General:  Chronically ill-appearing  HEENT  anicteric, moist oral mucous membranes  Pulm/lungs  clear bilateral, normal effort  CVS/Heart  irregular rhythm, no rub  Abdomen:   Soft, nontender, PEG tube in place  Extremities:  + dependent edema  Neurologic:  Awake, alert, conversant  Skin:  scattered ecchymoses  Access:  PermCath Left IJ       Basic Metabolic Panel:  Recent Labs  Lab 04/14/19 0643 04/14/19 0643 04/15/19 1012 04/15/19 1012 04/16/19 0550 04/17/19 0905 04/18/19 0422  NA 136  --  133*  --  133* 130* 134*  K 4.7  --  5.1  --  5.3* 5.9* 4.8  CL 99  --  96*  --  96* 95* 99  CO2 31  --  31  --  29 31 29   GLUCOSE 181*  --  164*  --  202* 153* 173*  BUN 18  --  27*  --  22 32* 19  CREATININE 2.33*  --  2.53*  --  2.07* 2.91* 2.01*  CALCIUM 8.6*   < > 8.3*   < > 8.9 8.9 8.5*  PHOS 3.0  --  2.6  --  3.0 3.0 2.7   < > = values in this interval not displayed.     CBC: Recent Labs  Lab 04/12/19 0943 04/13/19 0220 04/16/19 0550  WBC 4.6 4.9 4.1  HGB 9.5* 8.7* 9.2*  HCT 30.5* 27.8* 29.5*  MCV 99.3 98.9 98.3  PLT 217 212 177      Lab Results  Component Value Date   HEPBSAG NON REACTIVE 04/16/2019       Microbiology:  No results found for this or any previous visit (from the past 240 hour(s)).  Coagulation Studies: No results for input(s): LABPROT, INR in the last 72 hours.  Urinalysis: No results for input(s): COLORURINE, LABSPEC, PHURINE, GLUCOSEU, HGBUR, BILIRUBINUR, KETONESUR, PROTEINUR, UROBILINOGEN, NITRITE, LEUKOCYTESUR in the last 72 hours.  Invalid input(s): APPERANCEUR    Imaging: DG Chest Port 1 View  Result Date: 04/16/2019 CLINICAL DATA:  In stage renal disease EXAM: PORTABLE CHEST 1 VIEW COMPARISON:  03/04/2019 FINDINGS: Left dialysis catheter in place, unchanged. Prior CABG. Cardiomegaly. Bibasilar opacities, left greater than right could reflect atelectasis or infiltrates. No effusions or acute bony abnormality. IMPRESSION: Bibasilar atelectasis or infiltrates, left greater than right. Cardiomegaly. Electronically Signed   By: Rolm Baptise M.D.   On: 04/16/2019 19:05     Medications:   . sodium chloride 20 mL (03/23/19 1852)  . feeding supplement (OSMOLITE 1.5 CAL) 1,000 mL (04/15/19 1600)   . acidophilus  2 capsule Oral BID  . alteplase  2 mg Intracatheter Once  . alteplase  2 mg Intracatheter Once  . apixaban  5 mg Oral BID  . aspirin  81 mg Per Tube Daily  . B-complex with vitamin C  1 tablet Per Tube Daily  . Chlorhexidine Gluconate Cloth  6 each Topical Q0600  . epoetin (EPOGEN/PROCRIT) injection  10,000 Units Intravenous Q M,W,F-HD  . feeding supplement (PRO-STAT SUGAR FREE 64)  30 mL Per Tube Daily  . ferrous gluconate  324 mg Oral Daily  . fluticasone  2 spray Each Nare Daily  . folic acid  1 mg Oral Daily  . free water  20 mL Per Tube Q4H  . levothyroxine  88 mcg Per Tube Q0600  . mouth rinse  15 mL Mouth Rinse BID  . pantoprazole sodium  40 mg Per Tube BID  . senna-docusate  1 tablet Oral QHS   sodium chloride, acetaminophen, ipratropium-albuterol, labetalol, lip balm, morphine injection, naphazoline-glycerin, ondansetron (ZOFRAN) IV,  promethazine, promethazine, sodium chloride, traMADol  Assessment/ Plan:  64 y.o. female with  end-stage renal disease on hemodialysis, atrial fibrillation, chronic diastolic congestive heart failure, coronary artery disease status post CABG, diabetes mellitus type II, hypertension, hyperlipidemia, hypothyroidism, Covid positive in July 2020, recent nursing home stay in [place], Prolonged hospitalization for MRSA bacteremia/sepsis  admitted on 02/09/2019 for ESRD (end stage renal disease) ([place]) [N18.6] Altered mental status, unspecified altered mental status type [R41.82] Chest pain, unspecified type [R07.9] Sepsis, due to unspecified organism, unspecified whether acute organ dysfunction present ([place]) [A41.9]  [facility] MWF [facility]   #End-stage renal disease with hyperkalemia Patient completed dialysis yesterday.  We will plan for dialysis again on Monday.    #Anemia of chronic kidney disease  Lab Results  Component Value Date   HGB 9.2 (L) 04/16/2019  Continue Epogen 10,000 units IV with dialysis treatments.  #Secondary hyperparathyroidism Lab Results  Component Value Date   PTH 135 (H) 02/22/2017   CALCIUM 8.5 (L) 04/18/2019   CAION 1.20 07/03/2013   PHOS 2.7 04/18/2019  not on binders at present Tube feeds Osmolite 1.5 Cal Feeding supplements prostat 30 mL daily  #Acute encephalopathy Improved as compared to 2 weeks ago.  Currently awake, alert, and conversant.  #MRSA sepsis Right arm AV graft removed this admission on March 04, 2019   LOS: [address] [place] 2/13/20214:00 PM  [place], [place]  Note: This note was prepared with Dragon dictation. Any transcription errors are unintentional

## 2019-04-18 NOTE — Plan of Care (Signed)
Continuing with plan of care. 

## 2019-04-19 LAB — RENAL FUNCTION PANEL
Albumin: 2.4 g/dL — ABNORMAL LOW (ref 3.5–5.0)
Anion gap: 7 (ref 5–15)
BUN: 36 mg/dL — ABNORMAL HIGH (ref 8–23)
CO2: 30 mmol/L (ref 22–32)
Calcium: 9 mg/dL (ref 8.9–10.3)
Chloride: 95 mmol/L — ABNORMAL LOW (ref 98–111)
Creatinine, Ser: 2.95 mg/dL — ABNORMAL HIGH (ref 0.44–1.00)
GFR calc Af Amer: 19 mL/min — ABNORMAL LOW (ref >60)
GFR calc non Af Amer: 16 mL/min — ABNORMAL LOW (ref >60)
Glucose, Bld: 164 mg/dL — ABNORMAL HIGH (ref 70–99)
Phosphorus: 3.7 mg/dL (ref 2.5–4.6)
Potassium: 6.1 mmol/L — ABNORMAL HIGH (ref 3.5–5.1)
Sodium: 132 mmol/L — ABNORMAL LOW (ref 135–145)

## 2019-04-19 MED ORDER — DIPHENHYDRAMINE-ZINC ACETATE 2-0.1 % EX CREA
TOPICAL_CREAM | Freq: Every day | CUTANEOUS | Status: DC | PRN
  Filled 2019-04-19: qty 28

## 2019-04-19 MED ORDER — HYDROCORTISONE 1 % EX CREA
TOPICAL_CREAM | CUTANEOUS | Status: DC | PRN
  Filled 2019-04-19: qty 28

## 2019-04-19 NOTE — Progress Notes (Signed)
Physical Therapy Treatment Patient Details Name: Jamie Arias MRN: [id number] DOB: Jul 02, 1955 Today's Date: 04/19/2019    History of Present Illness Pt is a 64 y.o. female presenting to hospital 02/09/19 with AMS.  Pt admitted with MRSA sepsis and bacteremia with hypotension, toxic metabolic encephalopathy, ESRD on HD, hypokalemia, elevated troponin, a-fib, and anemia of CKD.  LUE DVT diagnosed 03/03/19.  Of note pt had been intubated but was extubated 03/04/20.  Pt s/p 2/6 upper GI endoscopy and replace PEG tube d/t clogged gastrostomy tube.  PMH includes ESRD on HD, a-fib, chronic diastolic CHF, CAD s/p CABG, DM, htn, COVID (+) July 2020.    PT Comments    Participated in exercises as described below.  Pt very motivated.  Asking to sit today.  Max a x 2 to EOB and was able to sit x 8 minutes before fatigued.  Some ankle pumps and LAQ in sitting with minor LOB's but generally able to sit statically with min guard.    Generally tolerated well and excited to participate in therapy today.  Encouraged pt to ask nursing to get her up with [facility] daily.  Pt and husband voiced understanding.   Follow Up Recommendations  SNF     Equipment Recommendations  Hospital bed    Recommendations for Other Services       Precautions / Restrictions Precautions Precautions: Fall Precaution Comments: L IJ HD permcath; NPO; PEG tube; aspiration precautions; sacral wound; elevate R UE; elevate HOB >30 degrees    Mobility  Bed Mobility Overal bed mobility: Needs Assistance Bed Mobility: Supine to Sit;Sit to Supine Rolling: +2 for physical assistance;Total assist   Supine to sit: Total assist;+2 for physical assistance Sit to supine: Total assist;+2 for physical assistance      Transfers                    Ambulation/Gait                 Stairs             Wheelchair Mobility    Modified Rankin (Stroke Patients Only)       Balance Overall balance assessment: Needs  assistance Sitting-balance support: Feet supported;Bilateral upper extremity supported Sitting balance-Leahy Scale: Fair Sitting balance - Comments: Once sitting is able to sit with supervision and min gaurd.                                    Cognition Arousal/Alertness: Awake/alert Behavior During Therapy: WFL for tasks assessed/performed Overall Cognitive Status: Impaired/Different from baseline                                        Exercises Other Exercises Other Exercises: BLE and UE A/AAROM for all planes for comfort, ROM and increased strength Other Exercises: sitting EOB x 8 minutes until fatigued    General Comments        Pertinent Vitals/Pain Pain Assessment: Faces Faces Pain Scale: Hurts even more Pain Location: Grimacing noted with BUE mobility. Pain Descriptors / Indicators: Grimacing;Sharp Pain Intervention(s): Limited activity within patient's tolerance;Monitored during session;Premedicated before session    Home Living                      Prior Function  PT Goals (current goals can now be found in the care plan section) Progress towards PT goals: Progressing toward goals    Frequency    Min 2X/week      PT Plan Current plan remains appropriate    Co-evaluation              AM-PAC PT "6 Clicks" Mobility   Outcome Measure  Help needed turning from your back to your side while in a flat bed without using bedrails?: Total Help needed moving from lying on your back to sitting on the side of a flat bed without using bedrails?: Total Help needed moving to and from a bed to a chair (including a wheelchair)?: Total Help needed standing up from a chair using your arms (e.g., wheelchair or bedside chair)?: Total Help needed to walk in hospital room?: Total Help needed climbing 3-5 steps with a railing? : Total 6 Click Score: 6    End of Session   Activity Tolerance: Patient tolerated  treatment well Patient left: in bed;with call bell/phone within reach;with bed alarm set;with family/visitor present;Other (comment)         Time: [id number] PT Time Calculation (min) (ACUTE ONLY): 31 min  Charges:  $Therapeutic Exercise: 8-22 mins $Therapeutic Activity: 8-22 mins                     Chesley Noon, PTA 04/19/19, 12:29 PM

## 2019-04-19 NOTE — Progress Notes (Signed)
PROGRESS NOTE    Jamie Arias  [id number] DOB: September 21, 1955 DOA: 02/09/2019 PCP: [name], MD   Brief Narrative:  [name] a 64 y.o.white femalewith end-stage renal disease on hemodialysis, atrial fibrillation, chronic diastolic congestive heart failure, coronary artery disease status post CABG, diabetes mellitus type II, hypertension, hyperlipidemia, hypothyroidism, Covid positive in July 2020, recent nursing home stay in [place]. Prolonged hospitalization for MRSA bacteremia/sepsis and now with acute encephalopathy.  2/10: Patient seen and evaluated during hemodialysis.  Patient does respond to name.  Unable to have conversation beyond this.  2/11: Patient seen and examined.  Response to name and is able to answer simple questions.  More communicative than yesterday.  2/12: Patient seen and examined during hemodialysis.  More awake and alert this morning.  Was able to hold a simple conversation.  2/13: Patient seen and examined.  Alert this morning and able to hold a conversation.  Social work had a lengthy conversation with the patient and husband at bedside yesterday.  He still not come to a decision in regards to disposition.  I again reiterated my strong recommendations with the patient consider skilled nursing facility placement even if it is a facility for from home.  I do not feel at this time she would be able to recover effectively at home and thus increasing her likelihood of readmission.  2/14: Patient seen and examined.  Husband Jamie Arias at bedside this afternoon.  From medical standpoint patient is stable and ready for discharge.  Had a lengthy discussion with Jamie Arias and the patient and reiterated my recommendation for skilled nursing facility.  Social work has been talking to the patient and the family.  We will need to make a decision within the next 24 hours.   Assessment & Plan:   Principal Problem:   Sepsis due to methicillin resistant  Staphylococcus aureus (MRSA) ([place]) Active Problems:   Type 2 diabetes mellitus with ESRD (end-stage renal disease) (HCC)   Atrial fibrillation, chronic (HCC)   Hypotension   Chronic diastolic CHF (congestive heart failure) (HCC)   ESRD (end stage renal disease) (HCC)   Altered level of consciousness   Decubitus ulcer of heel, bilateral, stage 2 (HCC)   MRSA bacteremia   Arm DVT (deep venous thromboembolism), acute, left (HCC)   Acute encephalopathy   Acute metabolic encephalopathy   Acute blood loss anemia   AF (paroxysmal atrial fibrillation) (HCC)   Diarrhea   Oropharyngeal dysphagia   Abscess   Palliative care by specialist   Goals of care, counseling/discussion  Acute metabolic encephalopathy:  -mental status improving slowly with patient saying a few words every now and then.  -Cannot hold meaningful conversation -MRI of brain and EEG negative. Patient remained afebrile with no leukocytosis. -B12 within normal range - IR performed LP on 03/20/19 AM: CSF shows only 8 WBC, Protein elevated at 73, Glucose 83. Negative for Toxo. CSF Cx final -- negative.  - ID, Neurology assistance  -Palliative care consulted -SLP eval recommended continue PEG tube feeding and aspiration precautions.   -Speech reconsulted.  More than likely will have to discharge with PEG tube in place however will be advantageous to get a baseline level of swallowing prior to patient discharge   Sepsis secondary to MRSA bacteremia.,  Resolved - Right arm graft was removed on 03/04/2019. -Completed vancomycin for 6-week course on 04/01/2019.  Anemia of chronic illness. -Status post transfusion 1 unit of PRBC on 03/17/2019 - hemoglobin stable at 6.9---transfused on 04/07/19--9.5 -  Iron studies consistent with anemia of chronic illness.   -B12 within normal range -Continue EPO with dialysis.  Hypotension, resolved Hypoalbuminemia -midodrine d/c'ed -blood pressure currently on the high side.      ESRDon HD(MWF). -Currently using tunneled catheter as graft was removed due to MRSA bacteremia. -Continue with scheduled dialysis per nephrology -patient has tolerated for our dialysis treatment sitting up.  Left arm DVT. -Currently on Eliquis - recent diagnosis of left arm DVT on 03/03/2019.  Hypothyroidism. Repeat TSH elevated at 17.82. -continue Increased dose of Synthroid 100. - repeat TSH in 2 to 3 weeks.  Paroxysmal A. Fib.. Currently in sinus. Amiodarone was discontinued. -continue eliquis   Type 2 diabetes. Hypoglycemic episodes --Most recent A1c in Dec 2020 was 5.3, BG during hospitalization never went above 200, has been receiving SSI with multiple hypoglycemic episodes.  No need for further fingersticks and SSI since BG within inpatient goals.  PLAN: --Discontinue all insulin and fingersticks.    Chronic diastolic heart failure. -Volume being managed by dialysis.  Bilateral decubitus ulcers.Present on admission. -Continue with wound care. -Wound care nurse to follow sacral wound; consulted again on 03/24/19   Nutrition.  PEG placed 03/03/2019. PEG tube clogged on 2/2 or 2/3, exchanged new tube on 2/6. SLP eval done, recommended n.p.o., continue PEG tube feeding and reconsult once mental status improves.  Acute hypoxic respiratory failure.Resolved.  Patient was intubated initially, extubated on 03/05/2019. -On room air now   DVT prophylaxis: Eliquis Code Status: DNR Family Communication: None today Disposition Plan: Unclear at this time.  Social work had a lengthy conversation with the patient and husband at bedside on 04/17/2019.  Still no decision in regards to placement and disposition.  I reiterate my strong recommendation for skilled nursing facility placement even if it is a facility for from home.  Her ability to effectively rehabilitate and home environment is greatly in question.    Request social work follow-up today for a  decision.  Discussed with bedside RN  Consultants:   Nephrology  Palliative care  Procedures:   None  Antimicrobials:   None   Subjective: Patient seen and examined Able to hold simple conversation today More alert and awake  Objective: Vitals:   04/18/19 1116 04/18/19 2013 04/19/19 0434 04/19/19 1248  BP: (!) 146/54 (!) 151/44 (!) 161/61 (!) 150/56  Pulse: 79 77 77 70  Resp: 15 20 16    Temp: 98.4 F (36.9 C) 98.2 F (36.8 C) 98.4 F (36.9 C) 98.1 F (36.7 C)  TempSrc: Axillary Oral Oral Oral  SpO2: 95% 96% 95% 92%  Weight:      Height:        Intake/Output Summary (Last 24 hours) at 04/19/2019 1425 Last data filed at 04/19/2019 0600 Gross per 24 hour  Intake 1075 ml  Output 0 ml  Net 1075 ml   Filed Weights   04/14/19 1210 04/15/19 0341 04/15/19 0955  Weight: 91.8 kg 89.2 kg 87.9 kg    Examination: Constitutional: NAD, sleeping comfortably with husband napping by her side HEENT: conjunctivae and lids normal, EOMI HD cath+ CV: RRR. Distal pulses +2. No cyanosis.  RESP: CTA B/L over anterior, normal respiratory effort, on RA GI: +BS, NTND, PEG tube in place Extremities: bilateral feet in soft boots SKIN: warm, dry, extensive bruising      Data Reviewed: I have personally reviewed following labs and imaging studies  CBC: Recent Labs  Lab 04/13/19 0220 04/16/19 0550  WBC 4.9 4.1  HGB 8.7* 9.2*  HCT 27.8* 29.5*  MCV 98.9 98.3  PLT 212 [id number]   Basic Metabolic Panel: Recent Labs  Lab 04/14/19 0643 04/15/19 1012 04/16/19 0550 04/17/19 0905 04/18/19 0422  NA 136 133* 133* 130* 134*  K 4.7 5.1 5.3* 5.9* 4.8  CL 99 96* 96* 95* 99  CO2 31 31 29 31 29   GLUCOSE 181* 164* 202* 153* 173*  BUN 18 27* 22 32* 19  CREATININE 2.33* 2.53* 2.07* 2.91* 2.01*  CALCIUM 8.6* 8.3* 8.9 8.9 8.5*  PHOS 3.0 2.6 3.0 3.0 2.7   GFR: Estimated Creatinine Clearance: 30 mL/min (A) (by C-G formula based on SCr of 2.01 mg/dL (H)). Liver Function Tests: Recent  Labs  Lab 04/14/19 0643 04/15/19 1012 04/16/19 0550 04/17/19 0905 04/18/19 0422  ALBUMIN 2.2* 2.1* 2.1* 2.0* 2.1*   No results for input(s): LIPASE, AMYLASE in the last 168 hours. No results for input(s): AMMONIA in the last 168 hours. Coagulation Profile: No results for input(s): INR, PROTIME in the last 168 hours. Cardiac Enzymes: No results for input(s): CKTOTAL, CKMB, CKMBINDEX, TROPONINI in the last 168 hours. BNP (last 3 results) No results for input(s): PROBNP in the last 8760 hours. HbA1C: No results for input(s): HGBA1C in the last 72 hours. CBG: Recent Labs  Lab 04/14/19 1611 04/14/19 2002 04/15/19 0024 04/15/19 0747 04/17/19 0515  GLUCAP 110* 155* 149* 173* 105*   Lipid Profile: No results for input(s): CHOL, HDL, LDLCALC, TRIG, CHOLHDL, LDLDIRECT in the last 72 hours. Thyroid Function Tests: No results for input(s): TSH, T4TOTAL, FREET4, T3FREE, THYROIDAB in the last 72 hours. Anemia Panel: No results for input(s): VITAMINB12, FOLATE, FERRITIN, TIBC, IRON, RETICCTPCT in the last 72 hours. Sepsis Labs: No results for input(s): PROCALCITON, LATICACIDVEN in the last 168 hours.  No results found for this or any previous visit (from the past 240 hour(s)).       Radiology Studies: No results found.      Scheduled Meds: . acidophilus  2 capsule Oral BID  . alteplase  2 mg Intracatheter Once  . alteplase  2 mg Intracatheter Once  . apixaban  5 mg Oral BID  . aspirin  81 mg Per Tube Daily  . B-complex with vitamin C  1 tablet Per Tube Daily  . Chlorhexidine Gluconate Cloth  6 each Topical Q0600  . epoetin (EPOGEN/PROCRIT) injection  10,000 Units Intravenous Q M,W,F-HD  . feeding supplement (PRO-STAT SUGAR FREE 64)  30 mL Per Tube Daily  . ferrous gluconate  324 mg Oral Daily  . fluticasone  2 spray Each Nare Daily  . folic acid  1 mg Oral Daily  . free water  20 mL Per Tube Q4H  . levothyroxine  88 mcg Per Tube Q0600  . mouth rinse  15 mL Mouth  Rinse BID  . pantoprazole sodium  40 mg Per Tube BID  . senna-docusate  1 tablet Oral QHS   Continuous Infusions: . sodium chloride 20 mL (03/23/19 1852)  . feeding supplement (OSMOLITE 1.5 CAL) 50 mL/hr at 04/18/19 1600     LOS: 69 days    Time spent: 35 minutes    Sidney Ace, MD [facility] Pager [phone]  If 7PM-7AM, please contact night-coverage  04/19/2019, 2:25 PM

## 2019-04-19 NOTE — Progress Notes (Signed)
[facility], [place] 04/19/19  Subjective:  Patient complains of some itching today. Otherwise resting comfortably. Due for dialysis again tomorrow.   Objective:  Vital signs in last 24 hours:  Temp:  [98.1 F (36.7 C)-98.4 F (36.9 C)] 98.1 F (36.7 C) (02/14 1248) Pulse Rate:  [70-77] 70 (02/14 1248) Resp:  [16-20] 16 (02/14 0434) BP: (150-161)/(44-61) 150/56 (02/14 1248) SpO2:  [92 %-96 %] 92 % (02/14 1248)  Weight change:  Filed Weights   04/14/19 1210 04/15/19 0341 04/15/19 0955  Weight: 91.8 kg 89.2 kg 87.9 kg    Intake/Output:    Intake/Output Summary (Last 24 hours) at 04/19/2019 1755 Last data filed at 04/19/2019 1600 Gross per 24 hour  Intake 1150 ml  Output 0 ml  Net 1150 ml     Physical Exam: General:  Chronically ill-appearing  HEENT  anicteric, moist oral mucous membranes  Pulm/lungs  clear bilateral, normal effort  CVS/Heart  irregular rhythm, no rub  Abdomen:   Soft, nontender, PEG tube in place  Extremities:  + dependent edema  Neurologic:  Awake, alert, conversant  Skin:  scattered ecchymoses  Access:  PermCath Left IJ       Basic Metabolic Panel:  Recent Labs  Lab 04/15/19 1012 04/15/19 1012 04/16/19 0550 04/16/19 0550 04/17/19 0905 04/18/19 0422 04/19/19 1442  NA 133*  --  133*  --  130* 134* 132*  K 5.1  --  5.3*  --  5.9* 4.8 6.1*  CL 96*  --  96*  --  95* 99 95*  CO2 31  --  29  --  31 29 30   GLUCOSE 164*  --  202*  --  153* 173* 164*  BUN 27*  --  22  --  32* 19 36*  CREATININE 2.53*  --  2.07*  --  2.91* 2.01* 2.95*  CALCIUM 8.3*   < > 8.9   < > 8.9 8.5* 9.0  PHOS 2.6  --  3.0  --  3.0 2.7 3.7   < > = values in this interval not displayed.     CBC: Recent Labs  Lab 04/13/19 0220 04/16/19 0550  WBC 4.9 4.1  HGB 8.7* 9.2*  HCT 27.8* 29.5*  MCV 98.9 98.3  PLT 212 177      Lab Results  Component Value Date   HEPBSAG NON REACTIVE 04/16/2019      Microbiology:  No results  found for this or any previous visit (from the past 240 hour(s)).  Coagulation Studies: No results for input(s): LABPROT, INR in the last 72 hours.  Urinalysis: No results for input(s): COLORURINE, LABSPEC, PHURINE, GLUCOSEU, HGBUR, BILIRUBINUR, KETONESUR, PROTEINUR, UROBILINOGEN, NITRITE, LEUKOCYTESUR in the last 72 hours.  Invalid input(s): APPERANCEUR    Imaging: No results found.   Medications:   . sodium chloride 20 mL (03/23/19 1852)  . feeding supplement (OSMOLITE 1.5 CAL) 50 mL/hr at 04/19/19 1600   . acidophilus  2 capsule Oral BID  . alteplase  2 mg Intracatheter Once  . alteplase  2 mg Intracatheter Once  . apixaban  5 mg Oral BID  . aspirin  81 mg Per Tube Daily  . B-complex with vitamin C  1 tablet Per Tube Daily  . Chlorhexidine Gluconate Cloth  6 each Topical Q0600  . epoetin (EPOGEN/PROCRIT) injection  10,000 Units Intravenous Q M,W,F-HD  . feeding supplement (PRO-STAT SUGAR FREE 64)  30 mL Per Tube Daily  . ferrous gluconate  324 mg Oral Daily  .  fluticasone  2 spray Each Nare Daily  . folic acid  1 mg Oral Daily  . free water  20 mL Per Tube Q4H  . levothyroxine  88 mcg Per Tube Q0600  . mouth rinse  15 mL Mouth Rinse BID  . pantoprazole sodium  40 mg Per Tube BID  . senna-docusate  1 tablet Oral QHS   sodium chloride, acetaminophen, diphenhydrAMINE-zinc acetate, hydrocortisone cream, ipratropium-albuterol, labetalol, lip balm, morphine injection, naphazoline-glycerin, ondansetron (ZOFRAN) IV, promethazine, promethazine, sodium chloride, traMADol  Assessment/ Plan:  64 y.o. female with  end-stage renal disease on hemodialysis, atrial fibrillation, chronic diastolic congestive heart failure, coronary artery disease status post CABG, diabetes mellitus type II, hypertension, hyperlipidemia, hypothyroidism, Covid positive in July 2020, recent nursing home stay in [place], Prolonged hospitalization for MRSA bacteremia/sepsis  admitted on 02/09/2019 for ESRD  (end stage renal disease) ([place]) [N18.6] Altered mental status, unspecified altered mental status type [R41.82] Chest pain, unspecified type [R07.9] Sepsis, due to unspecified organism, unspecified whether acute organ dysfunction present ([place]) [A41.9]  [facility] MWF [facility]   #End-stage renal disease with hyperkalemia Patient due for dialysis treatment again tomorrow.  Orders have been prepared.    #Anemia of chronic kidney disease  Lab Results  Component Value Date   HGB 9.2 (L) 04/16/2019  We plan to maintain the patient on Epogen with dialysis treatments.  #Secondary hyperparathyroidism Lab Results  Component Value Date   PTH 135 (H) 02/22/2017   CALCIUM 9.0 04/19/2019   CAION 1.20 07/03/2013   PHOS 3.7 04/19/2019  Phosphorus within target range at 3.7.  Not currently on binders.  #Acute encephalopathy Improved as compared to 2 weeks ago.  Currently awake, alert, and conversant.  #MRSA sepsis Right arm AV graft removed this admission on March 04, 2019   LOS: [id number] [place] Wiegert 2/14/20215:55 PM  [place], [place]  Note: This note was prepared with Dragon dictation. Any transcription errors are unintentional

## 2019-04-19 NOTE — TOC Progression Note (Signed)
Transition of Care [facility]) - Progression Note    Patient Details  Name: Jamie Arias MRN: [id number] Date of Birth: 1955/05/24  Transition of Care [facility]) CM/SW Contact  [address] [place] Gonzalez, [place] Phone Number: 04/19/2019, 3:31 PM  Clinical Narrative:    This Education officer, museum met with patient and spouse at bedside to discuss discharge plans. Patient was asleep, however patient's spouse available. Per patient's spouse, patient has still not made up her mind regarding skilled nursing care. Per patient's spouse, he understands that a decision needs to be made and reports understanding the benefits of skilled care but is concerned about the distance and the fact that she can have no visitors.  He confirms that patient's Medicaid application is pending. Per patient's spouse, he has researched the facility and will have a final decision made by tomorrow.   Svetlana Bagby, LCSW Clinical Social Work [phone]  Expected Discharge Plan: [place] Barriers to Discharge: Continued Medical Work up  Expected Discharge Plan and Services Expected Discharge Plan: [place] In-house Referral: Clinical Social Work     Living arrangements for the past 2 months: Mobile Home                                       Social Determinants of Health (SDOH) Interventions    Readmission Risk Interventions Readmission Risk Prevention Plan 02/13/2019 09/15/2018  Transportation Screening - Complete  Medication Review Press photographer) Complete Complete  PCP or Specialist appointment within 3-5 days of discharge - Complete  HRI or [place] Complete Complete  SW Recovery Care/Counseling Consult Complete Complete  [place] Not Applicable Complete  Some recent data might be hidden

## 2019-04-19 NOTE — Plan of Care (Signed)
Continuing with plan of care. 

## 2019-04-20 LAB — RENAL FUNCTION PANEL
Albumin: 2.2 g/dL — ABNORMAL LOW (ref 3.5–5.0)
Anion gap: 7 (ref 5–15)
BUN: 46 mg/dL — ABNORMAL HIGH (ref 8–23)
CO2: 30 mmol/L (ref 22–32)
Calcium: 8.7 mg/dL — ABNORMAL LOW (ref 8.9–10.3)
Chloride: 93 mmol/L — ABNORMAL LOW (ref 98–111)
Creatinine, Ser: 3.54 mg/dL — ABNORMAL HIGH (ref 0.44–1.00)
GFR calc Af Amer: 15 mL/min — ABNORMAL LOW (ref >60)
GFR calc non Af Amer: 13 mL/min — ABNORMAL LOW (ref >60)
Glucose, Bld: 203 mg/dL — ABNORMAL HIGH (ref 70–99)
Phosphorus: 4 mg/dL (ref 2.5–4.6)
Potassium: 6.8 mmol/L (ref 3.5–5.1)
Sodium: 130 mmol/L — ABNORMAL LOW (ref 135–145)

## 2019-04-20 LAB — CBC
HCT: 29.2 % — ABNORMAL LOW (ref 36.0–46.0)
Hemoglobin: 9.3 g/dL — ABNORMAL LOW (ref 12.0–15.0)
MCH: 31 pg (ref 26.0–34.0)
MCHC: 31.8 g/dL (ref 30.0–36.0)
MCV: 97.3 fL (ref 80.0–100.0)
Platelets: 205 10*3/uL (ref 150–400)
RBC: 3 MIL/uL — ABNORMAL LOW (ref 3.87–5.11)
RDW: 16.1 % — ABNORMAL HIGH (ref 11.5–15.5)
WBC: 6.1 10*3/uL (ref 4.0–10.5)
nRBC: 0.3 % — ABNORMAL HIGH (ref 0.0–0.2)

## 2019-04-20 LAB — PHOSPHORUS: Phosphorus: 3.9 mg/dL (ref 2.5–4.6)

## 2019-04-20 LAB — RESPIRATORY PANEL BY RT PCR (FLU A&B, COVID)
Influenza A by PCR: NEGATIVE
Influenza B by PCR: NEGATIVE
SARS Coronavirus 2 by RT PCR: NEGATIVE

## 2019-04-20 MED ORDER — FOLIC ACID 1 MG PO TABS: 1.0000 mg | ORAL_TABLET | Freq: Every day | ORAL | Status: AC

## 2019-04-20 MED ORDER — SENNOSIDES-DOCUSATE SODIUM 8.6-50 MG PO TABS: 1.0000 | ORAL_TABLET | Freq: Every day | ORAL | Status: DC

## 2019-04-20 MED ORDER — PRO-STAT SUGAR FREE PO LIQD: 30.0000 mL | Freq: Every day | ORAL | 0 refills | Status: DC

## 2019-04-20 MED ORDER — FERROUS GLUCONATE 324 (38 FE) MG PO TABS: 324.0000 mg | ORAL_TABLET | Freq: Every day | ORAL | 3 refills | Status: DC

## 2019-04-20 MED ORDER — SODIUM ZIRCONIUM CYCLOSILICATE 10 G PO PACK: 10.0000 g | PACK | Freq: Every day | ORAL | Status: DC

## 2019-04-20 MED ORDER — SODIUM ZIRCONIUM CYCLOSILICATE 10 G PO PACK
10.0000 g | PACK | Freq: Every day | ORAL | Status: DC
  Administered 2019-04-20: 10 g via ORAL
  Filled 2019-04-20 (×2): qty 1

## 2019-04-20 MED ORDER — IPRATROPIUM-ALBUTEROL 0.5-2.5 (3) MG/3ML IN SOLN: 3.0000 mL | RESPIRATORY_TRACT | Status: DC | PRN

## 2019-04-20 MED ORDER — PANTOPRAZOLE SODIUM 40 MG PO PACK: 40.0000 mg | PACK | Freq: Two times a day (BID) | ORAL | Status: DC

## 2019-04-20 MED ORDER — OSMOLITE 1.5 CAL PO LIQD: 1000.0000 mL | ORAL | 0 refills | Status: DC

## 2019-04-20 MED ORDER — HYDROCORTISONE 1 % EX CREA: TOPICAL_CREAM | CUTANEOUS | 0 refills | Status: AC | PRN

## 2019-04-20 MED ORDER — FREE WATER: 20.0000 mL | Status: DC

## 2019-04-20 NOTE — TOC Transition Note (Signed)
Transition of Care [facility]) - CM/SW Discharge Note   Patient Details  Name: Jamie Arias MRN: [id number] Date of Birth: 11-May-1955  Transition of Care [facility]) CM/SW Contact:  Shelbie Ammons, RN Phone Number: 04/20/2019, 4:53 PM   Clinical Narrative:   RNCM completed EMS paperwork and printed to floor, notified secretary, bedside nurse as well as charge. Rapid covid was completed and negative. Called [facility] to arrange transport. Notified bedside nurse as well as Marden Noble with [facility].     Final next level of care: [place] Barriers to Discharge: Barriers Resolved   Patient Goals and CMS Choice        Discharge Placement              Patient chooses bed at: [facility] Patient to be transferred to facility by: [place] Name of family member notified: Britainy Nemeroff Patient and family notified of of transfer: 04/20/19  Discharge Plan and Services In-house Referral: Clinical Social Work                                   Social Determinants of Health (SDOH) Interventions     Readmission Risk Interventions Readmission Risk Prevention Plan 02/13/2019 09/15/2018  Transportation Screening - Complete  Medication Review Press photographer) Complete Complete  PCP or Specialist appointment within 3-5 days of discharge - Complete  HRI or [place] Complete Complete  SW Recovery Care/Counseling Consult Complete Complete  [place] Not Applicable Complete  Some recent data might be hidden

## 2019-04-20 NOTE — Progress Notes (Signed)
[facility], [place] 04/20/19  Subjective:  Patient seen and evaluated during hemodialysis. Tolerating well. Potassium high at 6.8. Blood flow rate 350. Dialyzing against a 1K bath.   Objective:  Vital signs in last 24 hours:  Temp:  [97.5 F (36.4 C)-98.6 F (37 C)] 97.5 F (36.4 C) (02/15 0842) Pulse Rate:  [56-97] 78 (02/15 1030) Resp:  [14-20] 18 (02/15 1030) BP: (126-176)/(43-74) 145/74 (02/15 1030) SpO2:  [92 %-95 %] 95 % (02/15 1000) Weight:  [88.2 kg] 88.2 kg (02/15 0842)  Weight change:  Filed Weights   04/15/19 0341 04/15/19 0955 04/20/19 0842  Weight: 89.2 kg 87.9 kg 88.2 kg    Intake/Output:    Intake/Output Summary (Last 24 hours) at 04/20/2019 1046 Last data filed at 04/20/2019 0500 Gross per 24 hour  Intake 921 ml  Output --  Net 921 ml     Physical Exam: General:  Chronically ill-appearing  HEENT  anicteric, moist oral mucous membranes  Pulm/lungs  clear bilateral, normal effort  CVS/Heart  irregular rhythm, no rub  Abdomen:   Soft, nontender, PEG tube in place  Extremities:  + dependent edema  Neurologic:  Awake, alert, conversant  Skin:  scattered ecchymoses  Access:  PermCath Left IJ       Basic Metabolic Panel:  Recent Labs  Lab 04/16/19 0550 04/16/19 0550 04/17/19 0905 04/17/19 0905 04/18/19 0422 04/19/19 1442 04/20/19 0447 04/20/19 0649  NA 133*  --  130*  --  134* 132*  --  130*  K 5.3*  --  5.9*  --  4.8 6.1*  --  6.8*  CL 96*  --  95*  --  99 95*  --  93*  CO2 29  --  31  --  29 30  --  30  GLUCOSE 202*  --  153*  --  173* 164*  --  203*  BUN 22  --  32*  --  19 36*  --  46*  CREATININE 2.07*  --  2.91*  --  2.01* 2.95*  --  3.54*  CALCIUM 8.9   < > 8.9   < > 8.5* 9.0  --  8.7*  PHOS 3.0   < > 3.0  --  2.7 3.7 3.9 4.0   < > = values in this interval not displayed.     CBC: Recent Labs  Lab 04/16/19 0550 04/20/19 0447  WBC 4.1 6.1  HGB 9.2* 9.3*  HCT 29.5* 29.2*  MCV 98.3 97.3   PLT 177 205      Lab Results  Component Value Date   HEPBSAG NON REACTIVE 04/16/2019      Microbiology:  No results found for this or any previous visit (from the past 240 hour(s)).  Coagulation Studies: No results for input(s): LABPROT, INR in the last 72 hours.  Urinalysis: No results for input(s): COLORURINE, LABSPEC, PHURINE, GLUCOSEU, HGBUR, BILIRUBINUR, KETONESUR, PROTEINUR, UROBILINOGEN, NITRITE, LEUKOCYTESUR in the last 72 hours.  Invalid input(s): APPERANCEUR    Imaging: No results found.   Medications:   . sodium chloride 20 mL (03/23/19 1852)  . feeding supplement (OSMOLITE 1.5 CAL) 50 mL/hr at 04/19/19 1800   . acidophilus  2 capsule Oral BID  . alteplase  2 mg Intracatheter Once  . alteplase  2 mg Intracatheter Once  . apixaban  5 mg Oral BID  . aspirin  81 mg Per Tube Daily  . B-complex with vitamin C  1 tablet Per Tube Daily  .  Chlorhexidine Gluconate Cloth  6 each Topical Q0600  . epoetin (EPOGEN/PROCRIT) injection  10,000 Units Intravenous Q M,W,F-HD  . feeding supplement (PRO-STAT SUGAR FREE 64)  30 mL Per Tube Daily  . ferrous gluconate  324 mg Oral Daily  . fluticasone  2 spray Each Nare Daily  . folic acid  1 mg Oral Daily  . free water  20 mL Per Tube Q4H  . levothyroxine  88 mcg Per Tube Q0600  . mouth rinse  15 mL Mouth Rinse BID  . pantoprazole sodium  40 mg Per Tube BID  . senna-docusate  1 tablet Oral QHS   sodium chloride, acetaminophen, diphenhydrAMINE-zinc acetate, hydrocortisone cream, ipratropium-albuterol, labetalol, lip balm, morphine injection, naphazoline-glycerin, ondansetron (ZOFRAN) IV, promethazine, promethazine, sodium chloride, traMADol  Assessment/ Plan:  64 y.o. female with  end-stage renal disease on hemodialysis, atrial fibrillation, chronic diastolic congestive heart failure, coronary artery disease status post CABG, diabetes mellitus type II, hypertension, hyperlipidemia, hypothyroidism, Covid positive in July  2020, recent nursing home stay in [place], Prolonged hospitalization for MRSA bacteremia/sepsis  admitted on 02/09/2019 for ESRD (end stage renal disease) ([place]) [N18.6] Altered mental status, unspecified altered mental status type [R41.82] Chest pain, unspecified type [R07.9] Sepsis, due to unspecified organism, unspecified whether acute organ dysfunction present ([place]) [A41.9]  [facility] M[facility]   #End-stage renal disease with hyperkalemia Potassium up to 6.8.  We may need to add Lokelma to her tube feeds given high potassium.  Patient being dialyzed against a 1K bath at the moment.    #Anemia of chronic kidney disease  Lab Results  Component Value Date   HGB 9.3 (L) 04/20/2019  Hemoglobin 9.3.  Continue patient on Epogen.  #Secondary hyperparathyroidism Lab Results  Component Value Date   PTH 135 (H) 02/22/2017   CALCIUM 8.7 (L) 04/20/2019   CAION 1.20 07/03/2013   PHOS 4.0 04/20/2019  Phosphorus currently 4.0 and at target.  #Acute encephalopathy Improved as compared to 2 weeks ago.  Currently awake, alert, and conversant.  #MRSA sepsis Right arm AV graft removed this admission on March 04, 2019   LOS: [address] Jawon Dipiero 2/15/202110:46 AM  [place], [place]  Note: This note was prepared with Dragon dictation. Any transcription errors are unintentional

## 2019-04-20 NOTE — Progress Notes (Signed)
Jamie Arias to be D/C'd [facility] in [place] per MD order.  Discussed prescriptions and follow up appointments with the patient. Prescriptions given to patient, medication list explained in detail. Pt verbalized understanding.  Allergies as of 04/20/2019      Reactions   Nsaids Other (See Comments)   Contraindicated due to kidney disease.   Doxycycline Other (See Comments)   tremor      Medication List    STOP taking these medications   Accu-Chek Aviva Plus w/Device Kit   Accu-Chek FastClix Lancets Misc   accu-chek soft touch lancets   Alirocumab 75 MG/ML Soaj Commonly known as: Praluent   amiodarone 200 MG tablet Commonly known as: Chief Operating Officer KwikPen 100 UNIT/ML Sopn   Calcium Acetate 667 MG Tabs   ferrous sulfate 325 (65 FE) MG tablet   furosemide 20 MG tablet Commonly known as: LASIX   gabapentin 300 MG capsule Commonly known as: NEURONTIN   glucose blood test strip Commonly known as: Accu-Chek Aviva   lidocaine-prilocaine cream Commonly known as: EMLA   midodrine 10 MG tablet Commonly known as: PROAMATINE   mirtazapine 15 MG tablet Commonly known as: REMERON   pantoprazole 40 MG tablet Commonly known as: PROTONIX Replaced by: pantoprazole sodium 40 mg/20 mL Pack     TAKE these medications   acetaminophen 325 MG tablet Commonly known as: TYLENOL Take 650 mg by mouth daily as needed for moderate pain or headache.   ascorbic acid 500 MG tablet Commonly known as: VITAMIN C Take 1 tablet (500 mg total) by mouth daily.   aspirin EC 81 MG tablet Take 81 mg by mouth daily.   atorvastatin 40 MG tablet Commonly known as: LIPITOR Take 40 mg by mouth daily.   Eliquis 5 MG Tabs tablet Generic drug: apixaban Take 5 mg by mouth 2 (two) times daily.   feeding supplement (OSMOLITE 1.5 CAL) Liqd Place 1,000 mLs into feeding tube continuous.   feeding supplement (PRO-STAT SUGAR FREE 64) Liqd Place 30 mLs into feeding tube daily. Start  taking on: April 21, 2019 What changed:   how to take this  when to take this   ferrous gluconate 324 MG tablet Commonly known as: FERGON Take 1 tablet (324 mg total) by mouth daily. Start taking on: April 21, 2019   FLUoxetine 20 MG capsule Commonly known as: PROZAC Take 20 mg by mouth daily.   fluticasone 50 MCG/ACT nasal spray Commonly known as: FLONASE Place 2 sprays into both nostrils daily. What changed:   when to take this  reasons to take this   folic acid 1 MG tablet Commonly known as: FOLVITE Take 1 tablet (1 mg total) by mouth daily. Start taking on: April 21, 2019   free water Soln Place 20 mLs into feeding tube every 4 (four) hours.   hydrocortisone cream 1 % Apply topically as needed for itching.   ipratropium-albuterol 0.5-2.5 (3) MG/3ML Soln Commonly known as: DUONEB Take 3 mLs by nebulization every 4 (four) hours as needed.   levothyroxine 88 MCG tablet Commonly known as: SYNTHROID Take 88 mcg by mouth daily before breakfast.   multivitamin Tabs tablet Take 1 tablet by mouth at bedtime.   nitroGLYCERIN 0.4 MG SL tablet Commonly known as: NITROSTAT DISSOLVE ONE TABLET UNDER THE TONGUE EVERY 5 MINUTES AS NEEDED FOR CHEST PAIN.  DO NOT EXCEED A TOTAL OF 3 DOSES IN 15 MINUTES What changed: See the new instructions.   ondansetron 4 MG disintegrating tablet Commonly  known as: ZOFRAN-ODT Take 1 tablet (4 mg total) by mouth every 8 (eight) hours as needed for nausea or vomiting. appt with PCP further refills Dr. Caryl Bis   pantoprazole sodium 40 mg/20 mL Pack Commonly known as: PROTONIX Place 20 mLs (40 mg total) into feeding tube 2 (two) times daily. Replaces: pantoprazole 40 MG tablet   senna-docusate 8.6-50 MG tablet Commonly known as: Senokot-S Take 1 tablet by mouth at bedtime.   sodium zirconium cyclosilicate 10 g Pack packet Commonly known as: LOKELMA Take 10 g by mouth daily.   zinc sulfate 220 (50 Zn) MG capsule Take 1  capsule (220 mg total) by mouth daily.       Vitals:   04/20/19 1230 04/20/19 1315  BP: (!) 144/67 (!) 147/59  Pulse: 83 81  Resp:  16  Temp:  98.9 F (37.2 C)  SpO2:  93%    Skin clean, dry and intact without evidence of skin break down, no evidence of skin tears noted. IV catheter discontinued intact. Site without signs and symptoms of complications. Dressing and pressure applied. Pt denies pain at this time. No complaints noted.  An After Visit Summary was printed and given to the patient. Patient escorted via stretcher, via EMS to [facility].  Jamie Arias D Jamie Arias

## 2019-04-20 NOTE — Progress Notes (Signed)
Respiratory panel PCR sent to lab.   Fuller Mandril, RN

## 2019-04-20 NOTE — TOC Progression Note (Signed)
Transition of Care [facility]) - Progression Note    Patient Details  Name: Jamie Arias MRN: [id number] Date of Birth: 31-Dec-1955  Transition of Care [facility]) CM/SW Contact  Shelbie Ammons, RN Phone Number: 04/20/2019, 11:21 AM  Clinical Narrative:   9:30am- Patient in dialysis, placed call to husband to assess SNF decision. Husband reports that he will be at hospital after patient has dialysis between 1-2 and he will have decision at that time. Did review with husband that a decision on patient's discharge plan will need to be made today. Husband verbalized understanding.   11:00am-  RNCM went to dialysis unit to speak with patient, introduced self and what role is. Explained to patient as well that decision will need to be made today about whether she is going to accept the bed at [facility] or whether she is going to go home. Patient quiet and only verbalizing short answers to questions but does report she has reservations about going back into a rehab facility.     Expected Discharge Plan: [place] Barriers to Discharge: Continued Medical Work up  Expected Discharge Plan and Services Expected Discharge Plan: [place] In-house Referral: Clinical Social Work     Living arrangements for the past 2 months: Mobile Home                                       Social Determinants of Health (SDOH) Interventions    Readmission Risk Interventions Readmission Risk Prevention Plan 02/13/2019 09/15/2018  Transportation Screening - Complete  Medication Review Press photographer) Complete Complete  PCP or Specialist appointment within 3-5 days of discharge - Complete  HRI or [place] Complete Complete  SW Recovery Care/Counseling Consult Complete Complete  [place] Not Applicable Complete  Some recent data might be hidden

## 2019-04-20 NOTE — Progress Notes (Signed)
Chart reviewed. Pt familiar to this ST. Currently in dialysis, reattemept swallow eval when she returns to her room later today.

## 2019-04-20 NOTE — Care Management Important Message (Signed)
Important Message  Patient Details  Name: Jamie Arias MRN: [id number] Date of Birth: 1955/12/05   Medicare Important Message Given:  Yes     Dannette Barbara 04/20/2019, 11:52 AM

## 2019-04-20 NOTE — Progress Notes (Signed)
CRITICAL VALUE ALERT  Critical Value:  Potassium 6.8  Date & Time Notied:  04/20/19 0753  Provider Notified: Dr. Priscella Mann  Orders Received/Actions taken: placed patient on monitor, awaiting orders from MD.   Fuller Mandril, RN

## 2019-04-20 NOTE — Evaluation (Signed)
Clinical/Bedside Swallow Evaluation Patient Details  Name: Jamie Arias MRN: [id number] Date of Birth: [birth year]  Today's Date: 04/20/2019 Time: SLP Start Time (ACUTE ONLY): 1340 SLP Stop Time (ACUTE ONLY): [id number] SLP Time Calculation (min) (ACUTE ONLY): 57 min  Past Medical History:  Past Medical History:  Diagnosis Date  . Anemia   . Chronic diastolic CHF (congestive heart failure) ([place])    a. Due to ischemic cardiomyopathy. EF as low as 35%, improved to normal s/p CABG; b. echo 07/06/13: EF 55-60%, no RWMAs, mod TR, trivial pericardial effusion not c/w tamponade physiology;  c. 10/2015 Echo: EF 65%, Gr1 DD, triv AI, mild MR, mildly dil LA, mod TR, PASP 71mmHg.  Marland Kitchen Coronary artery disease    a. NSTEMI 06/2013; b.cath: severe three-vessel CAD w/ EF 30% & mild-mod MR; c. s/p 3 vessel CABG 07/02/13 (LIMA-LAD, SVG-OM, and SVG-RPDA);  d. 10/2015 MV: no ischemia/infarct.  . Diabetes mellitus without complication ([place])   . Diabetic neuropathy ([place])   . Dialysis patient [facility])    MWF  . ESRD (end stage renal disease) ([place])    a. 12/2015 initiated - mwf dialysis.  Marland Kitchen GERD (gastroesophageal reflux disease)   . Hyperlipidemia   . Hypertension   . Hypothyroidism   . Myocardial infarction ([place]) 2015  . Neuropathy   . Pleural effusion 2015  . Pulmonary hypertension ([place])   . Renal insufficiency   . Wears dentures    full lower   Past Surgical History:  Past Surgical History:  Procedure Laterality Date  . A/V FISTULAGRAM Right 12/17/2016   Procedure: A/V Fistulagram;  Surgeon: Algernon Huxley, MD;  Location: [place] CV LAB;  Service: Cardiovascular;  Laterality: Right;  . A/V FISTULAGRAM Right 01/07/2017   Procedure: A/V Fistulagram;  Surgeon: Algernon Huxley, MD;  Location: [place] CV LAB;  Service: Cardiovascular;  Laterality: Right;  . A/V FISTULAGRAM Right 12/03/2017   Procedure: A/V FISTULAGRAM;  Surgeon: Katha Cabal, MD;  Location: [place] CV LAB;  Service: Cardiovascular;   Laterality: Right;  . A/V FISTULAGRAM Right 02/23/2019   Procedure: A/V Fistulagram;  Surgeon: Algernon Huxley, MD;  Location: [place] CV LAB;  Service: Cardiovascular;  Laterality: Right;  . A/V SHUNT INTERVENTION N/A 02/22/2017   Procedure: A/V SHUNT INTERVENTION;  Surgeon: Algernon Huxley, MD;  Location: [place] CV LAB;  Service: Cardiovascular;  Laterality: N/A;  . AV FISTULA PLACEMENT Right 02/03/2016   Procedure: INSERTION OF ARTERIOVENOUS (AV) GORE-TEX GRAFT ARM ( BRACH / AXILLARY );  Surgeon: Katha Cabal, MD;  Location: [facility] ORS;  Service: Vascular;  Laterality: Right;  . [place] REMOVAL Right 03/04/2019   Procedure: REMOVAL OF ARTERIOVENOUS GORETEX GRAFT ([place]);  Surgeon: Algernon Huxley, MD;  Location: [facility] ORS;  Service: Vascular;  Laterality: Right;  . CARDIAC CATHETERIZATION    . CATARACT EXTRACTION Bilateral   . CHOLECYSTECTOMY N/A 12/09/2014   Procedure: LAPAROSCOPIC CHOLECYSTECTOMY;  Surgeon: Marlyce [name], MD;  Location: [facility] ORS;  Service: General;  Laterality: N/A;  . CORONARY ARTERY BYPASS GRAFT N/A 07/02/2013   Procedure: CORONARY ARTERY BYPASS GRAFTING (CABG);  Surgeon: Ivin [name], MD;  Location: [place];  Service: Open Heart Surgery;  Laterality: N/A;  CABG x three, using left internal mammary artery and right leg greater saphenous vein harvested endoscopically  . ESOPHAGOGASTRODUODENOSCOPY N/A 04/11/2019   Procedure: ESOPHAGOGASTRODUODENOSCOPY (EGD);  Surgeon: Jonathon Bellows, MD;  Location[facility] ENDOSCOPY;  Service: Gastroenterology;  Laterality: N/A;  . ESOPHAGOGASTRODUODENOSCOPY (EGD) WITH PROPOFOL N/A 11/24/2015  Procedure: ESOPHAGOGASTRODUODENOSCOPY (EGD) WITH PROPOFOL;  Surgeon: [name], MD;  Location: [place];  Service: Endoscopy;  Laterality: N/A;  Diabetic - insulin  . EYE SURGERY Bilateral    Cataract Extraction with IOL  . INTRAOPERATIVE TRANSESOPHAGEAL ECHOCARDIOGRAM N/A 07/02/2013   Procedure: INTRAOPERATIVE TRANSESOPHAGEAL  ECHOCARDIOGRAM;  Surgeon: Ivin [name], MD;  Location: [place];  Service: Open Heart Surgery;  Laterality: N/A;  . PEG PLACEMENT N/A 03/03/2019   Procedure: PERCUTANEOUS ENDOSCOPIC GASTROSTOMY (PEG) PLACEMENT;  Surgeon: Virgel Manifold, MD;  Location: [facility] ENDOSCOPY;  Service: Endoscopy;  Laterality: N/A;  . PERIPHERAL VASCULAR CATHETERIZATION Right 12/06/2015   Procedure: Dialysis/Perma Catheter Insertion;  Surgeon: Katha Cabal, MD;  Location: [place] CV LAB;  Service: Cardiovascular;  Laterality: Right;  . PERIPHERAL VASCULAR THROMBECTOMY Right 09/28/2016   Procedure: Peripheral Vascular Thrombectomy;  Surgeon: Katha Cabal, MD;  Location: [place] CV LAB;  Service: Cardiovascular;  Laterality: Right;  . PERIPHERAL VASCULAR THROMBECTOMY Right 05/20/2018   Procedure: PERIPHERAL VASCULAR THROMBECTOMY;  Surgeon: Katha Cabal, MD;  Location: [place] CV LAB;  Service: Cardiovascular;  Laterality: Right;  . PORTA CATH REMOVAL N/A 06/01/2016   Procedure: Glori [name] Cath Removal;  Surgeon: Katha Cabal, MD;  Location: [place] CV LAB;  Service: Cardiovascular;  Laterality: N/A;  . THORACENTESIS Left 2015   HPI:    "[name] a 64 y.o.white femalewith end-stage renal disease on hemodialysis, atrial fibrillation, chronic diastolic congestive heart failure, coronary artery disease status post CABG, diabetes mellitus type II, hypertension, hyperlipidemia, hypothyroidism, Covid positive in July 2020, recent nursing home stay in [place]. Prolonged hospitalization for MRSA bacteremia/sepsis and now with acute encephalopathy".  Pt was seen for ST services on last admission. Currently has PEG as she was unable to maintain nutrition and hydration. Today she is much more alert and responsive. Follows directions and communicates verbally.   Assessment / Plan / Recommendation Clinical Impression  Pt was alert, pleasant and followed directions without  difficulty for bedside swallow eval today. Noted improvement since last assessed on last admission. Today Pt was able to respond to questions and communicate needs and wants. Pt currently has PEG for all nutrition and hydration and assessment today is to determine of Pt can begin taking PO's now that her mental status has improved. Pt and husband report Pt has only had PO ice chips for pleasure since last admission. Bedside swallow eval today revealed functional swallowing with no overt s/s of aspiration with any tested consistency. Oral mech exam grossly normal. Pt has upper and lower dentures at home which she reports she needs to eat any solid foods. Oral phase of swallow was adequate for purees, DNT solids without dentures. Vocal quality remined clear throughout assessment, laryngeal elevation appeared adequate. Rec dysphagia 1 diet with thin liquids. Possible discharge to rehab today or tomorrow. Rec ST eval at rehab facility to f/u with toleration of diet and advance to solids if Pt tolerates with dentures. Also rec RD assessment for tube feeding recs to ensure Pt receives adequate nutrition while slowly increasing PO amounts. Prognosis good. MD and dietitian made aware of new diet order and recomendations. SLP Visit Diagnosis: Dysphagia, oropharyngeal phase (R13.12)    Aspiration Risk  Mild aspiration risk    Diet Recommendation Dysphagia 1 (Puree);Thin liquid   Liquid Administration via: Cup;Straw Medication Administration: Whole meds with puree Supervision: Intermittent supervision to cue for compensatory strategies Compensations: Minimize environmental distractions;Slow rate;Small sips/bites Postural Changes: Seated upright at 90 degrees  Other  Recommendations  ST eval at discharge to assess with solid foods.  Follow up Recommendations Skilled Nursing facility      Frequency and Duration min 1 x/week  1 week       Prognosis Prognosis for Safe Diet Advancement: Good       Swallow Study   General Date of Onset: 02/09/19 Type of Study: Bedside Swallow Evaluation Previous Swallow Assessment: 02/17/2019 Temperature Spikes Noted: No Respiratory Status: Room air History of Recent Intubation: No Behavior/Cognition: Alert;Cooperative;Pleasant mood Oral Cavity Assessment: Within Functional Limits Oral Care Completed by SLP: No Oral Cavity - Dentition: Dentures, top;Dentures, bottom;Dentures, not available Vision: Functional for self-feeding Self-Feeding Abilities: Needs assist Patient Positioning: Upright in bed Baseline Vocal Quality: Normal Volitional Swallow: Able to elicit    Oral/Motor/Sensory Function Overall Oral Motor/Sensory Function: Within functional limits Facial ROM: Within Functional Limits Facial Symmetry: Within Functional Limits Facial Strength: Within Functional Limits Facial Sensation: Within Functional Limits Lingual ROM: Within Functional Limits Lingual Symmetry: Within Functional Limits Lingual Strength: Within Functional Limits Lingual Sensation: Within Functional Limits Velum: Within Functional Limits Mandible: Within Functional Limits   Ice Chips Ice chips: Within functional limits Presentation: Spoon   Thin Liquid Thin Liquid: Within functional limits Presentation: Cup;Self Fed;Spoon;Straw    Nectar Thick Nectar Thick Liquid: Not tested   Honey Thick Honey Thick Liquid: Not tested   Puree Puree: Within functional limits Presentation: Spoon   Solid     Solid: Not tested      Lucila Maine 04/20/2019,2:38 PM

## 2019-04-20 NOTE — Progress Notes (Signed)
Nutrition Follow-up  DOCUMENTATION CODES:   Obesity unspecified  INTERVENTION:   Continue Osmolite 1.5 @50ml/hr + prostat liquid protein 30 ml daily via tube, supplement provides 100 kcal, 15 grams protein.  Free water flushes 20ml q4 hours to maintain tube patency   Regimen provides 1900kcal/day, 90g/day protein, 1094ml/day free water.   B-complex with C daily via tube  Folic acid 1mg daily via tube  Risaquad per tube BID  NUTRITION DIAGNOSIS:   Increased nutrient needs related to chronic illness(ESRD on HD) as evidenced by estimated needs. Ongoing.  GOAL:   Patient will meet greater than or equal to 90% of their needs -met with tube feeds   MONITOR:   Labs, Weight trends, TF tolerance, Skin, I & O's  ASSESSMENT:   63 y.o. female with end-stage renal disease on HD, atrial fibrillation, chronic diastolic congestive heart failure, CAD status post PCI, CABG,  type 2 diabetes, hypertension, hyperlipidemia, hypothyroidism, Covid positive in July 2020 admitted with AMS and found to have MRSA positive blood cultures   Pt s/p PEG tube exchange 2/6  Pt tolerating tube feeds well at goal rate of 50ml/hr. Pt noted to have type 7 stool today. Fiber supplements were discontinued r/t patient complaining of nausea; we may need to restart these if diarrhea persists. Iron supplement held x 1 week r/t pt complaining of nausea; this has been restarted.    Per chart, pt up ~19lbs since admit.   Medications reviewed and include: riasquad, aspirin, B complex with C, ferrous gluconate, folic acid, synthroid, protonix, senokot   Labs reviewed: K 6.8(H), BUN 46(H), creat 3.54(H), P 4.0 wnl Hgb 9.3(L), Hct 29.2(L) Folate- 4.5(L)- 1/14  Diet Order:   Diet Order            Diet NPO time specified  Diet effective now             EDUCATION NEEDS:   Not appropriate for education at this time  Skin:  Skin Assessment: Skin Integrity Issues:(stg II right buttocks; DTI right heel;  unstageable left heel)  Last BM:  2/15- type 7  Height:   Ht Readings from Last 1 Encounters:  03/04/19 5' 4.02" (1.626 m)   Weight:   Wt Readings from Last 1 Encounters:  04/20/19 88.2 kg   Ideal Body Weight:  45 kg  BMI:  Body mass index is 33.36 kg/m.  Estimated Nutritional Needs:   Kcal:  1700-1900kcal/day  Protein:  85-95g/day  Fluid:  UOP +1L    MS, RD, LDN Pager #- [phone] Office#- [phone] After Hours Pager: [phone] 

## 2019-04-20 NOTE — Progress Notes (Signed)
OT Cancellation Note  Patient Details Name: Jamie Arias MRN: [id number] DOB: Feb 02, 1956   Cancelled Treatment:    Reason Eval/Treat Not Completed: Medical issues which prohibited therapy  Upon chart review this AM, pt noted to have elevated K+ at 6.8 which is outside of therapy guidelines for safe participation in activity. Will f/u as pt becomes more appropriate for OT. Thank you.  Gerrianne Scale, [place], OTR/L ascom [phone] 04/20/19, 8:31 AM

## 2019-04-20 NOTE — TOC Progression Note (Signed)
Transition of Care [facility]) - Progression Note    Patient Details  Name: Jamie Arias MRN: [id number] Date of Birth: [birth year]  Transition of Care [facility]) CM/SW Contact  Shelbie Ammons, RN Phone Number: 04/20/2019, 2:53 PM  Clinical Narrative:   RNCM met with patient and husband at bedside, after some discussion patient and husband agreeable to go to [facility] in [place]. All questions answered as able. Discussed that patient may go today or tomorrow but it is unclear at this time. Placed call to [facility] at [facility] to notify him of bed acceptance, spoke with Elvera Bicker dialysis co-ordinator to verify chair time is still available. Doug reports patient will need rapid Covid screen.  RNCM called AC and rapid Covid was ordered, notified floor RN of this. Estill Bamberg verified that patient's chair time was still available and that she would start dialysis on Thursday and would be on a TTS, 12:15 schedule. All of this information was relayed to [facility] as well as that patient had a swallowing eval today and her diet would be advanced to pureed diet.     Expected Discharge Plan: [place] Barriers to Discharge: Continued Medical Work up  Expected Discharge Plan and Services Expected Discharge Plan: [place] In-house Referral: Clinical Social Work     Living arrangements for the past 2 months: Mobile Home                                       Social Determinants of Health (SDOH) Interventions    Readmission Risk Interventions Readmission Risk Prevention Plan 02/13/2019 09/15/2018  Transportation Screening - Complete  Medication Review Press photographer) Complete Complete  PCP or Specialist appointment within 3-5 days of discharge - Complete  HRI or [place] Complete Complete  SW Recovery Care/Counseling Consult Complete Complete  [place] Not Applicable Complete  Some  recent data might be hidden

## 2019-04-20 NOTE — Progress Notes (Signed)
PROGRESS NOTE    Jamie Arias  [id number] DOB: [birth year] DOA: 02/09/2019 PCP: [name], MD   Brief Narrative:  [name] a 64 y.o.white femalewith end-stage renal disease on hemodialysis, atrial fibrillation, chronic diastolic congestive heart failure, coronary artery disease status post CABG, diabetes mellitus type II, hypertension, hyperlipidemia, hypothyroidism, Covid positive in July 2020, recent nursing home stay in [place]. Prolonged hospitalization for MRSA bacteremia/sepsis and now with acute encephalopathy.  2/10: Patient seen and evaluated during hemodialysis.  Patient does respond to name.  Unable to have conversation beyond this.  2/11: Patient seen and examined.  Response to name and is able to answer simple questions.  More communicative than yesterday.  2/12: Patient seen and examined during hemodialysis.  More awake and alert this morning.  Was able to hold a simple conversation.  2/13: Patient seen and examined.  Alert this morning and able to hold a conversation.  Social work had a lengthy conversation with the patient and husband at bedside yesterday.  He still not come to a decision in regards to disposition.  I again reiterated my strong recommendations with the patient consider skilled nursing facility placement even if it is a facility for from home.  I do not feel at this time she would be able to recover effectively at home and thus increasing her likelihood of readmission.  2/14: Patient seen and examined.  Husband Mark at bedside this afternoon.  From medical standpoint patient is stable and ready for discharge.  Had a lengthy discussion with [name] and the patient and reiterated my recommendation for skilled nursing facility.  Social work has been talking to the patient and the family.  We will need to make a decision within the next 24 hours.  2/15: Patient seen and examined during hemodialysis.  Husband [name] not at bedside at time of my  evaluation.  From medical standpoint this patient remains medically stable for discharge.  Awaiting family decision regarding disposition plan.  Skilled nursing facility versus home with home health.  Had a lengthy discussion with [name] the husband yesterday.  I again reiterated my strong recommendation for skilled nursing facility.  Awaiting final decision.   Assessment & Plan:   Principal Problem:   Sepsis due to methicillin resistant Staphylococcus aureus (MRSA) ([place]) Active Problems:   Type 2 diabetes mellitus with ESRD (end-stage renal disease) (HCC)   Atrial fibrillation, chronic (HCC)   Hypotension   Chronic diastolic CHF (congestive heart failure) (HCC)   ESRD (end stage renal disease) (HCC)   Altered level of consciousness   Decubitus ulcer of heel, bilateral, stage 2 (HCC)   MRSA bacteremia   Arm DVT (deep venous thromboembolism), acute, left (HCC)   Acute encephalopathy   Acute metabolic encephalopathy   Acute blood loss anemia   AF (paroxysmal atrial fibrillation) (HCC)   Diarrhea   Oropharyngeal dysphagia   Abscess   Palliative care by specialist   Goals of care, counseling/discussion  Acute metabolic encephalopathy:  -mental status improving slowly with patient saying a few words every now and then.  -Cannot hold meaningful conversation -MRI of brain and EEG negative. Patient remained afebrile with no leukocytosis. -B12 within normal range - IR performed LP on 03/20/19 AM: CSF shows only 8 WBC, Protein elevated at 73, Glucose 83. Negative for Toxo. CSF Cx final -- negative.  - ID, Neurology assistance  -Palliative care consulted -SLP eval recommended continue PEG tube feeding and aspiration precautions.   -PT reconsulted.  No signs  or symptoms of aspiration which is very promising.  Patient will discharge with PEG tube in place however will need close follow-up for diet advancement in the future.  Sepsis secondary to MRSA bacteremia.,  Resolved - Right arm  graft was removed on 03/04/2019. -Completed vancomycin for 6-week course on 04/01/2019.  Anemia of chronic illness. -Status post transfusion 1 unit of PRBC on 03/17/2019 - hemoglobin stable at 6.9---transfused on 04/07/19--9.5 - Iron studies consistent with anemia of chronic illness.   -B12 within normal range -Continue EPO with dialysis.  Hypotension, resolved Hypoalbuminemia -midodrine d/c'ed -blood pressure currently on the high side.    ESRDon HD(MWF). -Currently using tunneled catheter as graft was removed due to MRSA bacteremia. -Continue with scheduled dialysis per nephrology -patient has tolerated for our dialysis treatment sitting up.  Left arm DVT. -Currently on Eliquis - recent diagnosis of left arm DVT on 03/03/2019.  Hypothyroidism. Repeat TSH elevated at 17.82. -continue Increased dose of Synthroid 100. - repeat TSH in 2 to 3 weeks.  Paroxysmal A. Fib.. Currently in sinus. Amiodarone was discontinued. -continue eliquis   Type 2 diabetes. Hypoglycemic episodes --Most recent A1c in Dec 2020 was 5.3, BG during hospitalization never went above 200, has been receiving SSI with multiple hypoglycemic episodes.  No need for further fingersticks and SSI since BG within inpatient goals.  PLAN: --Discontinue all insulin and fingersticks.    Chronic diastolic heart failure. -Volume being managed by dialysis.  Bilateral decubitus ulcers.Present on admission. -Continue with wound care. -Wound care nurse to follow sacral wound; consulted again on 03/24/19   Acute hypoxic respiratory failure.Resolved.  Patient was intubated initially, extubated on 03/05/2019. -On room air now   DVT prophylaxis: Eliquis Code Status: DNR Family Communication: None today Disposition Plan: Unclear at this time.  Social work had a lengthy conversation with the patient and husband at bedside on 04/17/2019.  Still no decision in regards to placement and  disposition.    I reiterate my strong recommendation for skilled nursing facility placement even if it is a facility for from home.  Her ability to effectively rehabilitate and home environment is greatly in question.    Request social work follow-up today for a decision.  Discussed with bedside RN  Consultants:   Nephrology  Palliative care  Procedures:   None  Antimicrobials:   None   Subjective: Patient seen and examined No acute events over interval  No new complaints  Objective: Vitals:   04/20/19 1215 04/20/19 1225 04/20/19 1230 04/20/19 1315  BP: 129/68 (!) 128/59 (!) 144/67 (!) 147/59  Pulse: 79 80 83 81  Resp: 20 20  16   Temp:    98.9 F (37.2 C)  TempSrc:    Oral  SpO2:    93%  Weight:      Height:        Intake/Output Summary (Last 24 hours) at 04/20/2019 1442 Last data filed at 04/20/2019 1225 Gross per 24 hour  Intake 721 ml  Output 1500 ml  Net -779 ml   Filed Weights   04/15/19 0341 04/15/19 0955 04/20/19 0842  Weight: 89.2 kg 87.9 kg 88.2 kg    Examination: Constitutional: NAD, sleeping comfortably with husband napping by her side HEENT: conjunctivae and lids normal, EOMI HD cath+ CV: RRR. Distal pulses +2. No cyanosis.  RESP: CTA B/L over anterior, normal respiratory effort, on RA GI: +BS, NTND, PEG tube in place Extremities: bilateral feet in soft boots SKIN: warm, dry, extensive bruising  Data Reviewed: I have personally reviewed following labs and imaging studies  CBC: Recent Labs  Lab 04/16/19 0550 04/20/19 0447  WBC 4.1 6.1  HGB 9.2* 9.3*  HCT 29.5* 29.2*  MCV 98.3 97.3  PLT 177 [id number]   Basic Metabolic Panel: Recent Labs  Lab 04/16/19 0550 04/16/19 0550 04/17/19 0905 04/18/19 0422 04/19/19 1442 04/20/19 0447 04/20/19 0649  NA 133*  --  130* 134* 132*  --  130*  K 5.3*  --  5.9* 4.8 6.1*  --  6.8*  CL 96*  --  95* 99 95*  --  93*  CO2 29  --  31 29 30   --  30  GLUCOSE 202*  --  153* 173* 164*  --  203*    BUN 22  --  32* 19 36*  --  46*  CREATININE 2.07*  --  2.91* 2.01* 2.95*  --  3.54*  CALCIUM 8.9  --  8.9 8.5* 9.0  --  8.7*  PHOS 3.0   < > 3.0 2.7 3.7 3.9 4.0   < > = values in this interval not displayed.   GFR: Estimated Creatinine Clearance: 17 mL/min (A) (by C-G formula based on SCr of 3.54 mg/dL (H)). Liver Function Tests: Recent Labs  Lab 04/16/19 0550 04/17/19 0905 04/18/19 0422 04/19/19 1442 04/20/19 0649  ALBUMIN 2.1* 2.0* 2.1* 2.4* 2.2*   No results for input(s): LIPASE, AMYLASE in the last 168 hours. No results for input(s): AMMONIA in the last 168 hours. Coagulation Profile: No results for input(s): INR, PROTIME in the last 168 hours. Cardiac Enzymes: No results for input(s): CKTOTAL, CKMB, CKMBINDEX, TROPONINI in the last 168 hours. BNP (last 3 results) No results for input(s): PROBNP in the last 8760 hours. HbA1C: No results for input(s): HGBA1C in the last 72 hours. CBG: Recent Labs  Lab 04/14/19 1611 04/14/19 2002 04/15/19 0024 04/15/19 0747 04/17/19 0515  GLUCAP 110* 155* 149* 173* 105*   Lipid Profile: No results for input(s): CHOL, HDL, LDLCALC, TRIG, CHOLHDL, LDLDIRECT in the last 72 hours. Thyroid Function Tests: No results for input(s): TSH, T4TOTAL, FREET4, T3FREE, THYROIDAB in the last 72 hours. Anemia Panel: No results for input(s): VITAMINB12, FOLATE, FERRITIN, TIBC, IRON, RETICCTPCT in the last 72 hours. Sepsis Labs: No results for input(s): PROCALCITON, LATICACIDVEN in the last 168 hours.  No results found for this or any previous visit (from the past 240 hour(s)).       Radiology Studies: No results found.      Scheduled Meds: . acidophilus  2 capsule Oral BID  . alteplase  2 mg Intracatheter Once  . alteplase  2 mg Intracatheter Once  . apixaban  5 mg Oral BID  . aspirin  81 mg Per Tube Daily  . B-complex with vitamin C  1 tablet Per Tube Daily  . Chlorhexidine Gluconate Cloth  6 each Topical Q0600  . epoetin  (EPOGEN/PROCRIT) injection  10,000 Units Intravenous Q M,W,F-HD  . feeding supplement (PRO-STAT SUGAR FREE 64)  30 mL Per Tube Daily  . ferrous gluconate  324 mg Oral Daily  . fluticasone  2 spray Each Nare Daily  . folic acid  1 mg Oral Daily  . free water  20 mL Per Tube Q4H  . levothyroxine  88 mcg Per Tube Q0600  . mouth rinse  15 mL Mouth Rinse BID  . pantoprazole sodium  40 mg Per Tube BID  . senna-docusate  1 tablet Oral QHS  . sodium zirconium cyclosilicate  10 g Oral Daily   Continuous Infusions: . sodium chloride 20 mL (03/23/19 1852)  . feeding supplement (OSMOLITE 1.5 CAL) 50 mL/hr at 04/19/19 1800     LOS: 70 days    Time spent: 35 minutes    Sidney Ace, MD [facility] Pager [phone]  If 7PM-7AM, please contact night-coverage  04/20/2019, 2:42 PM

## 2019-04-20 NOTE — Progress Notes (Signed)
PT Cancellation Note  Patient Details Name: Jamie Arias MRN: [id number] DOB: Apr 16, 1955   Cancelled Treatment:    Reason Eval/Treat Not Completed: Medical issues which prohibited therapy.  Chart reviewed.  Pt's potassium noted to be elevated and up-trending to 6.8 this morning.  Per PT guidelines for elevated potassium, exertional activity currently contraindicated.  Will hold PT at this time and re-attempt PT treatment session at a later date/time as medically appropriate.   Leitha Bleak, PT 04/20/19, 8:25 AM

## 2019-04-20 NOTE — Progress Notes (Signed)
This note also relates to the following rows which could not be included: Pulse Rate - Cannot attach notes to unvalidated device data BP - Cannot attach notes to unvalidated device data  Hd started  

## 2019-04-20 NOTE — Progress Notes (Signed)
Hd completed 

## 2019-04-20 NOTE — Progress Notes (Addendum)
[facility] in [place] multiple times to give report. No answer and no option to leave message.   Fuller Mandril, RN

## 2019-04-20 NOTE — Discharge Summary (Signed)
Physician Discharge Summary  Jamie Arias JHE:[id number] DOB: [birth year] DOA: 02/09/2019  PCP: [name], MD  Admit date: 02/09/2019 Discharge date: 04/20/2019  Admitted From: Home Disposition: Skilled nursing facility Recommendations for Outpatient Follow-up:  1. Follow up with PCP in 1-2 weeks 2. Follow-up with nephrology as directed 3. Recommend continued speech therapy evaluations and skilled nursing facility  Home Health: No Equipment/Devices: None Discharge Condition: Stable CODE STATUS: DNR Diet recommendation: Dysphagia 1  Brief/Interim Summary: Disclaimer: Patient with a very complex and lengthy hospital stay requiring 70 days.  For further details not explained in this discharge summary please refer to daily progress notes and consultation notes  [name] a 64 y.o.white femalewith end-stage renal disease on hemodialysis, atrial fibrillation, chronic diastolic congestive heart failure, coronary artery disease status post CABG, diabetes mellitus type II, hypertension, hyperlipidemia, hypothyroidism, Covid positive in July 2020, recent nursing home stay in [place]. Prolonged hospitalization for MRSA bacteremia/sepsis and now with acute encephalopathy.  2/10: Patient seen and evaluated during hemodialysis.  Patient does respond to name.  Unable to have conversation beyond this.  2/11: Patient seen and examined.  Response to name and is able to answer simple questions.  More communicative than yesterday.  2/12: Patient seen and examined during hemodialysis.  More awake and alert this morning.  Was able to hold a simple conversation.  2/13: Patient seen and examined.  Alert this morning and able to hold a conversation.  Social work had a lengthy conversation with the patient and husband at bedside yesterday.  He still not come to a decision in regards to disposition.  I again reiterated my strong recommendations with the patient consider skilled nursing  facility placement even if it is a facility for from home.  I do not feel at this time she would be able to recover effectively at home and thus increasing her likelihood of readmission.  2/14: Patient seen and examined.  Husband Mark at bedside this afternoon.  From medical standpoint patient is stable and ready for discharge.  Had a lengthy discussion with [name] and the patient and reiterated my recommendation for skilled nursing facility.  Social work has been talking to the patient and the family.  We will need to make a decision within the next 24 hours.  2/15: Patient seen and examined during hemodialysis.  Husband [name] not at bedside at time of my evaluation.  From medical standpoint this patient remains medically stable for discharge.  Awaiting family decision regarding disposition plan.  Skilled nursing facility versus home with home health.  Had a lengthy discussion with [name] the husband yesterday.  I again reiterated my strong recommendation for skilled nursing facility.  Awaiting final decision.  Patient has been agreeable to SNF placement.  Patient will dispo to [facility] in [place].  Notified nephrology of patient's discharge   Discharge Diagnoses:  Principal Problem:   Sepsis due to methicillin resistant Staphylococcus aureus (MRSA) ([place]) Active Problems:   Type 2 diabetes mellitus with ESRD (end-stage renal disease) (HCC)   Atrial fibrillation, chronic (HCC)   Hypotension   Chronic diastolic CHF (congestive heart failure) (HCC)   ESRD (end stage renal disease) (HCC)   Altered level of consciousness   Decubitus ulcer of heel, bilateral, stage 2 ([place])   MRSA bacteremia   Arm DVT (deep venous thromboembolism), acute, left (HCC)   Acute encephalopathy   Acute metabolic encephalopathy   Acute blood loss anemia   AF (paroxysmal atrial fibrillation) (HCC)   Diarrhea  Oropharyngeal dysphagia   Abscess   Palliative care by specialist   Goals of care,  counseling/discussion  Acute metabolic encephalopathy:  -mental status improving slowly with patient saying a few words every now and then.  -Cannot hold meaningful conversation -MRI of brain and EEG negative. Patient remained afebrile with no leukocytosis. -B12 within normal range - IR performed LP on 03/20/19 AM: CSF shows only 8 WBC, Protein elevated at 73, Glucose 83. Negative for Toxo. CSF Cx final -- negative.  - ID, Neurology assistance  -Palliative care consulted -SLP eval recommended continue PEG tube feeding and aspiration precautions.  -PT reconsulted.  No signs or symptoms of aspiration which is very promising.  Patient will discharge with PEG tube in place however will need close follow-up for diet advancement in the future.  Sepsis secondary to MRSA bacteremia.,  Resolved - Right arm graft was removed on 03/04/2019. -Completed vancomycin for 6-week course on 04/01/2019.  Anemia of chronic illness. -Status post transfusion 1 unit of PRBC on 03/17/2019 - hemoglobin stable at 6.9---transfused on 04/07/19--9.5 - Iron studies consistent with anemia of chronic illness.  -B12 within normal range -Continue EPO with dialysis.  Hypotension,resolved Hypoalbuminemia -midodrined/c'ed -blood pressure currentlyon the high side.  ESRDon HD(MWF). -Currently using tunneled catheter as graft was removed due to MRSA bacteremia. -Continue with scheduled dialysis per nephrology -patient has tolerated for our dialysis treatment sitting up.  Left arm DVT. -Currently on Eliquis - recent diagnosis of left arm DVT on 03/03/2019.  Hypothyroidism. Repeat TSH elevated at 17.82. -continue Increased dose of Synthroid 100. - repeat TSH in 2 to 3 weeks.  Paroxysmal A. Fib.. Currently in sinus. Amiodarone was discontinued. -continueeliquis   Type 2 diabetes. Hypoglycemic episodes --Most recent A1c in Dec 2020 was 5.3, BG during hospitalization never went  above 200, has been receiving SSI with multiple hypoglycemic episodes. No need for further fingersticks and SSI since BG within inpatient goals.  PLAN: --Discontinue all insulin and fingersticks.  Chronic diastolic heart failure. -Volume being managed by dialysis.  Bilateral decubitus ulcers.Present on admission. -Continue with wound care. -Wound care nurse to follow sacral wound; consulted again on 03/24/19   Acute hypoxic respiratory failure.Resolved.  Patient was intubated initially, extubated on 03/05/2019. -On room air now  Discharge Instructions  Discharge Instructions    Diet - low sodium heart healthy   Complete by: As directed    Increase activity slowly   Complete by: As directed      Allergies as of 04/20/2019      Reactions   Nsaids Other (See Comments)   Contraindicated due to kidney disease.   Doxycycline Other (See Comments)   tremor      Medication List    STOP taking these medications   Accu-Chek Aviva Plus w/Device Kit   Accu-Chek FastClix Lancets Misc   accu-chek soft touch lancets   Alirocumab 75 MG/ML Soaj Commonly known as: Praluent   amiodarone 200 MG tablet Commonly known as: [employer] KwikPen 100 UNIT/ML Sopn   Calcium Acetate 667 MG Tabs   ferrous sulfate 325 (65 FE) MG tablet   furosemide 20 MG tablet Commonly known as: LASIX   gabapentin 300 MG capsule Commonly known as: NEURONTIN   glucose blood test strip Commonly known as: Accu-Chek Aviva   lidocaine-prilocaine cream Commonly known as: EMLA   midodrine 10 MG tablet Commonly known as: PROAMATINE   mirtazapine 15 MG tablet Commonly known as: REMERON   pantoprazole 40 MG tablet Commonly known as:  PROTONIX Replaced by: pantoprazole sodium 40 mg/20 mL Pack     TAKE these medications   acetaminophen 325 MG tablet Commonly known as: TYLENOL Take 650 mg by mouth daily as needed for moderate pain or headache.   ascorbic acid 500 MG  tablet Commonly known as: VITAMIN C Take 1 tablet (500 mg total) by mouth daily.   aspirin EC 81 MG tablet Take 81 mg by mouth daily.   atorvastatin 40 MG tablet Commonly known as: LIPITOR Take 40 mg by mouth daily.   Eliquis 5 MG Tabs tablet Generic drug: apixaban Take 5 mg by mouth 2 (two) times daily.   feeding supplement (OSMOLITE 1.5 CAL) Liqd Place 1,000 mLs into feeding tube continuous.   feeding supplement (PRO-STAT SUGAR FREE 64) Liqd Place 30 mLs into feeding tube daily. Start taking on: April 21, 2019 What changed:   how to take this  when to take this   ferrous gluconate 324 MG tablet Commonly known as: FERGON Take 1 tablet (324 mg total) by mouth daily. Start taking on: April 21, 2019   FLUoxetine 20 MG capsule Commonly known as: PROZAC Take 20 mg by mouth daily.   fluticasone 50 MCG/ACT nasal spray Commonly known as: FLONASE Place 2 sprays into both nostrils daily. What changed:   when to take this  reasons to take this   folic acid 1 MG tablet Commonly known as: FOLVITE Take 1 tablet (1 mg total) by mouth daily. Start taking on: April 21, 2019   free water Soln Place 20 mLs into feeding tube every 4 (four) hours.   hydrocortisone cream 1 % Apply topically as needed for itching.   ipratropium-albuterol 0.5-2.5 (3) MG/3ML Soln Commonly known as: DUONEB Take 3 mLs by nebulization every 4 (four) hours as needed.   levothyroxine 88 MCG tablet Commonly known as: SYNTHROID Take 88 mcg by mouth daily before breakfast.   multivitamin Tabs tablet Take 1 tablet by mouth at bedtime.   nitroGLYCERIN 0.4 MG SL tablet Commonly known as: NITROSTAT DISSOLVE ONE TABLET UNDER THE TONGUE EVERY 5 MINUTES AS NEEDED FOR CHEST PAIN.  DO NOT EXCEED A TOTAL OF 3 DOSES IN 15 MINUTES What changed: See the new instructions.   ondansetron 4 MG disintegrating tablet Commonly known as: ZOFRAN-ODT Take 1 tablet (4 mg total) by mouth every 8 (eight)  hours as needed for nausea or vomiting. appt with PCP further refills Dr. Caryl Bis   pantoprazole sodium 40 mg/20 mL Pack Commonly known as: PROTONIX Place 20 mLs (40 mg total) into feeding tube 2 (two) times daily. Replaces: pantoprazole 40 MG tablet   senna-docusate 8.6-50 MG tablet Commonly known as: Senokot-S Take 1 tablet by mouth at bedtime.   sodium zirconium cyclosilicate 10 g Pack packet Commonly known as: LOKELMA Take 10 g by mouth daily.   zinc sulfate 220 (50 Zn) MG capsule Take 1 capsule (220 mg total) by mouth daily.      Follow-up Information    Caroline More, DPM. Schedule an appointment as soon as possible for a visit in 1 week.   Specialty: Podiatry Contact information: [place] [address] [phone]        Kris Hartmann, NP Follow up in 1 week(s).   Specialty: Vascular Surgery Why: First post-op visit. Incision check. Will need new dialysis access.  Contact information: [place] [address] [phone]          Allergies  Allergen Reactions  . Nsaids Other (  See Comments)    Contraindicated due to kidney disease.  Marland Kitchen Doxycycline Other (See Comments)    tremor    Consultations:  Nephrology-[facility] [place] [facility]  Palliative care  Gastroenterology  Obstetrics and gynecology  Neurology  Critical care  Vascular surgery  Podiatry  Infectious disease   Procedures/Studies: DG Abd 1 View  Result Date: 04/09/2019 CLINICAL DATA:  Abdominal pain. EXAM: ABDOMEN - 1 VIEW COMPARISON:  Abdomen 04/05/2019. Chest x-ray 03/04/2019. CT 10/23/2015. FINDINGS: Prior CABG. Surgical clips right upper quadrant. Tubing noted over the abdomen. Soft tissues the abdomen are unremarkable. Stool noted throughout the colon. No bowel distention or free air. Prominent skin folds again noted. Stable vascular calcification. Bibasilar subsegmental atelectasis and or scarring. Stable left pleural thickening noted most  likely scarring. Degenerative change thoracolumbar spine and both hips. Diffuse osteopenia. No acute bony abnormality identified. IMPRESSION: No acute abnormality identified. Electronically Signed   By: Marcello Moores  Register   On: 04/09/2019 08:48   DG Abd 1 View  Result Date: 04/05/2019 CLINICAL DATA:  Nausea.  End-stage renal disease. EXAM: ABDOMEN - 1 VIEW COMPARISON:  None. FINDINGS: Two supine views. Cholecystectomy. Prior median sternotomy. Cardiomegaly. No gaseous distention of bowel loops. No pneumatosis. Lucency about the upper abdomen is favored to be within or adjacent the pannus. Vascular calcifications. IMPRESSION: No acute findings. Electronically Signed   By: Abigail Miyamoto M.D.   On: 04/05/2019 16:10   DG Chest Port 1 View  Result Date: 04/16/2019 CLINICAL DATA:  In stage renal disease EXAM: PORTABLE CHEST 1 VIEW COMPARISON:  03/04/2019 FINDINGS: Left dialysis catheter in place, unchanged. Prior CABG. Cardiomegaly. Bibasilar opacities, left greater than right could reflect atelectasis or infiltrates. No effusions or acute bony abnormality. IMPRESSION: Bibasilar atelectasis or infiltrates, left greater than right. Cardiomegaly. Electronically Signed   By: Rolm Baptise M.D.   On: 04/16/2019 19:05    (Echo, Carotid, EGD, Colonoscopy, ERCP)    Subjective: Seen and examined on the day of discharge At baseline mental status, able to communicate and hold conversation Husband and patient agreed to skilled nursing facility placement  Discharge Exam: Vitals:   04/20/19 1230 04/20/19 1315  BP: (!) 144/67 (!) 147/59  Pulse: 83 81  Resp:  16  Temp:  98.9 F (37.2 C)  SpO2:  93%   Vitals:   04/20/19 1215 04/20/19 1225 04/20/19 1230 04/20/19 1315  BP: 129/68 (!) 128/59 (!) 144/67 (!) 147/59  Pulse: 79 80 83 81  Resp: '20 20  16  '$ Temp:    98.9 F (37.2 C)  TempSrc:    Oral  SpO2:    93%  Weight:      Height:       Constitutional: NAD,sleeping comfortably with husband napping by her  side HEENT: conjunctivae and lids normal, EOMI HD cath+ CV: RRR. Distal pulses +2. No cyanosis.  RESP: CTA B/L over anterior, normal respiratory effort, on RA GI: +BS, NTND,PEG tube in place Extremities: bilateral feet in soft boots SKIN: warm, dry, extensive bruising     The results of significant diagnostics from this hospitalization (including imaging, microbiology, ancillary and laboratory) are listed below for reference.     Microbiology: No results found for this or any previous visit (from the past 240 hour(s)).   Labs: BNP (last 3 results) Recent Labs    03/01/19 0429  BNP 628.3*   Basic Metabolic Panel: Recent Labs  Lab 04/16/19 0550 04/16/19 0550 04/17/19 0905 04/18/19 0422 04/19/19 1442 04/20/19 0447 04/20/19 0649  NA 133*  --  130* 134* 132*  --  130*  K 5.3*  --  5.9* 4.8 6.1*  --  6.8*  CL 96*  --  95* 99 95*  --  93*  CO2 29  --  '31 29 30  '$ --  30  GLUCOSE 202*  --  153* 173* 164*  --  203*  BUN 22  --  32* 19 36*  --  46*  CREATININE 2.07*  --  2.91* 2.01* 2.95*  --  3.54*  CALCIUM 8.9  --  8.9 8.5* 9.0  --  8.7*  PHOS 3.0   < > 3.0 2.7 3.7 3.9 4.0   < > = values in this interval not displayed.   Liver Function Tests: Recent Labs  Lab 04/16/19 0550 04/17/19 0905 04/18/19 0422 04/19/19 1442 04/20/19 0649  ALBUMIN 2.1* 2.0* 2.1* 2.4* 2.2*   No results for input(s): LIPASE, AMYLASE in the last 168 hours. No results for input(s): AMMONIA in the last 168 hours. CBC: Recent Labs  Lab 04/16/19 0550 04/20/19 0447  WBC 4.1 6.1  HGB 9.2* 9.3*  HCT 29.5* 29.2*  MCV 98.3 97.3  PLT 177 205   Cardiac Enzymes: No results for input(s): CKTOTAL, CKMB, CKMBINDEX, TROPONINI in the last 168 hours. BNP: Invalid input(s): POCBNP CBG: Recent Labs  Lab 04/14/19 1611 04/14/19 2002 04/15/19 0024 04/15/19 0747 04/17/19 0515  GLUCAP 110* 155* 149* 173* 105*   D-Dimer No results for input(s): DDIMER in the last 72 hours. Hgb A1c No results for  input(s): HGBA1C in the last 72 hours. Lipid Profile No results for input(s): CHOL, HDL, LDLCALC, TRIG, CHOLHDL, LDLDIRECT in the last 72 hours. Thyroid function studies No results for input(s): TSH, T4TOTAL, T3FREE, THYROIDAB in the last 72 hours.  Invalid input(s): FREET3 Anemia work up No results for input(s): VITAMINB12, FOLATE, FERRITIN, TIBC, IRON, RETICCTPCT in the last 72 hours. Urinalysis    Component Value Date/Time   COLORURINE YELLOW (A) 08/23/2018 1209   APPEARANCEUR CLOUDY (A) 08/23/2018 1209   APPEARANCEUR Hazy 07/23/2013 0741   LABSPEC 1.016 08/23/2018 1209   LABSPEC 1.016 07/23/2013 0741   PHURINE 5.0 08/23/2018 1209   GLUCOSEU NEGATIVE 08/23/2018 1209   GLUCOSEU Negative 07/23/2013 0741   HGBUR SMALL (A) 08/23/2018 1209   BILIRUBINUR NEGATIVE 08/23/2018 1209   BILIRUBINUR Negative 07/23/2013 0741   KETONESUR NEGATIVE 08/23/2018 1209   PROTEINUR 100 (A) 08/23/2018 1209   UROBILINOGEN 0.2 06/30/2013 2213   NITRITE NEGATIVE 08/23/2018 1209   LEUKOCYTESUR SMALL (A) 08/23/2018 1209   LEUKOCYTESUR Negative 07/23/2013 0741   Sepsis Labs Invalid input(s): PROCALCITONIN,  WBC,  LACTICIDVEN Microbiology No results found for this or any previous visit (from the past 240 hour(s)).   Time coordinating discharge: Over 30 minutes  SIGNED:   Sidney Ace, MD  [facility] 04/20/2019, 3:02 PM Pager   If 7PM-7AM, please contact night-coverage

## 2019-04-20 NOTE — Progress Notes (Signed)
   04/20/19 1400  Clinical Encounter Type  Visited With Patient  Visit Type Follow-up  Referral From Chaplain  Consult/Referral To Chaplain  While doing rounds, Chaplain visited with patient. Patient was somewhat uncomfortable because of itching. Nurse came in to give patient medicine shortly after Chaplain started talking with her.CHaplain commented on a small white bear that she was holding. Patient was not sure if it was from her husband or her sister. Chaplain encouraged her to ask. Patient mentioned something about leaving. On the way out of the room Chaplain met the husband and he also said something about patient leaving between Tuesday and Wednesday. Chaplain offered pastoral presence and empathy.

## 2019-04-21 DIAGNOSIS — R41841 Cognitive communication deficit: Secondary | ICD-10-CM | POA: Diagnosis not present

## 2019-04-21 DIAGNOSIS — I480 Paroxysmal atrial fibrillation: Secondary | ICD-10-CM | POA: Diagnosis not present

## 2019-04-21 DIAGNOSIS — R262 Difficulty in walking, not elsewhere classified: Secondary | ICD-10-CM | POA: Diagnosis not present

## 2019-04-21 DIAGNOSIS — G9341 Metabolic encephalopathy: Secondary | ICD-10-CM | POA: Diagnosis not present

## 2019-04-21 DIAGNOSIS — E039 Hypothyroidism, unspecified: Secondary | ICD-10-CM | POA: Diagnosis not present

## 2019-04-21 DIAGNOSIS — R471 Dysarthria and anarthria: Secondary | ICD-10-CM | POA: Diagnosis not present

## 2019-04-21 DIAGNOSIS — I5032 Chronic diastolic (congestive) heart failure: Secondary | ICD-10-CM | POA: Diagnosis not present

## 2019-04-21 DIAGNOSIS — N186 End stage renal disease: Secondary | ICD-10-CM | POA: Diagnosis not present

## 2019-04-21 DIAGNOSIS — M6281 Muscle weakness (generalized): Secondary | ICD-10-CM | POA: Diagnosis not present

## 2019-04-21 LAB — FUNGUS CULTURE RESULT

## 2019-04-21 LAB — FUNGUS CULTURE WITH STAIN

## 2019-04-21 LAB — FUNGAL ORGANISM REFLEX

## 2019-04-22 DIAGNOSIS — R471 Dysarthria and anarthria: Secondary | ICD-10-CM | POA: Diagnosis not present

## 2019-04-22 DIAGNOSIS — J90 Pleural effusion, not elsewhere classified: Secondary | ICD-10-CM | POA: Insufficient documentation

## 2019-04-22 DIAGNOSIS — K2100 Gastro-esophageal reflux disease with esophagitis, without bleeding: Secondary | ICD-10-CM | POA: Insufficient documentation

## 2019-04-22 DIAGNOSIS — M6281 Muscle weakness (generalized): Secondary | ICD-10-CM | POA: Diagnosis not present

## 2019-04-22 DIAGNOSIS — G9341 Metabolic encephalopathy: Secondary | ICD-10-CM | POA: Diagnosis not present

## 2019-04-22 DIAGNOSIS — R41841 Cognitive communication deficit: Secondary | ICD-10-CM | POA: Diagnosis not present

## 2019-04-22 DIAGNOSIS — R262 Difficulty in walking, not elsewhere classified: Secondary | ICD-10-CM | POA: Diagnosis not present

## 2019-04-22 DIAGNOSIS — I272 Pulmonary hypertension, unspecified: Secondary | ICD-10-CM | POA: Insufficient documentation

## 2019-04-22 DIAGNOSIS — L89610 Pressure ulcer of right heel, unstageable: Secondary | ICD-10-CM | POA: Diagnosis not present

## 2019-04-23 DIAGNOSIS — E11649 Type 2 diabetes mellitus with hypoglycemia without coma: Secondary | ICD-10-CM | POA: Diagnosis not present

## 2019-04-23 DIAGNOSIS — E1122 Type 2 diabetes mellitus with diabetic chronic kidney disease: Secondary | ICD-10-CM | POA: Diagnosis not present

## 2019-04-23 DIAGNOSIS — N2581 Secondary hyperparathyroidism of renal origin: Secondary | ICD-10-CM | POA: Diagnosis not present

## 2019-04-23 DIAGNOSIS — D631 Anemia in chronic kidney disease: Secondary | ICD-10-CM | POA: Diagnosis not present

## 2019-04-23 DIAGNOSIS — E039 Hypothyroidism, unspecified: Secondary | ICD-10-CM | POA: Diagnosis not present

## 2019-04-23 DIAGNOSIS — R471 Dysarthria and anarthria: Secondary | ICD-10-CM | POA: Diagnosis not present

## 2019-04-23 DIAGNOSIS — M6281 Muscle weakness (generalized): Secondary | ICD-10-CM | POA: Diagnosis not present

## 2019-04-23 DIAGNOSIS — N186 End stage renal disease: Secondary | ICD-10-CM | POA: Diagnosis not present

## 2019-04-23 DIAGNOSIS — I10 Essential (primary) hypertension: Secondary | ICD-10-CM | POA: Diagnosis not present

## 2019-04-23 DIAGNOSIS — G9341 Metabolic encephalopathy: Secondary | ICD-10-CM | POA: Diagnosis not present

## 2019-04-23 DIAGNOSIS — R41841 Cognitive communication deficit: Secondary | ICD-10-CM | POA: Diagnosis not present

## 2019-04-23 DIAGNOSIS — R262 Difficulty in walking, not elsewhere classified: Secondary | ICD-10-CM | POA: Diagnosis not present

## 2019-04-23 DIAGNOSIS — D509 Iron deficiency anemia, unspecified: Secondary | ICD-10-CM | POA: Diagnosis not present

## 2019-04-23 DIAGNOSIS — Z992 Dependence on renal dialysis: Secondary | ICD-10-CM | POA: Diagnosis not present

## 2019-04-24 DIAGNOSIS — R41841 Cognitive communication deficit: Secondary | ICD-10-CM | POA: Diagnosis not present

## 2019-04-24 DIAGNOSIS — M6281 Muscle weakness (generalized): Secondary | ICD-10-CM | POA: Diagnosis not present

## 2019-04-24 DIAGNOSIS — R471 Dysarthria and anarthria: Secondary | ICD-10-CM | POA: Diagnosis not present

## 2019-04-24 DIAGNOSIS — G9341 Metabolic encephalopathy: Secondary | ICD-10-CM | POA: Diagnosis not present

## 2019-04-24 DIAGNOSIS — R262 Difficulty in walking, not elsewhere classified: Secondary | ICD-10-CM | POA: Diagnosis not present

## 2019-04-25 DIAGNOSIS — E1122 Type 2 diabetes mellitus with diabetic chronic kidney disease: Secondary | ICD-10-CM | POA: Diagnosis not present

## 2019-04-25 DIAGNOSIS — N2581 Secondary hyperparathyroidism of renal origin: Secondary | ICD-10-CM | POA: Diagnosis not present

## 2019-04-25 DIAGNOSIS — Z992 Dependence on renal dialysis: Secondary | ICD-10-CM | POA: Diagnosis not present

## 2019-04-25 DIAGNOSIS — D509 Iron deficiency anemia, unspecified: Secondary | ICD-10-CM | POA: Diagnosis not present

## 2019-04-25 DIAGNOSIS — N186 End stage renal disease: Secondary | ICD-10-CM | POA: Diagnosis not present

## 2019-04-26 DIAGNOSIS — G9341 Metabolic encephalopathy: Secondary | ICD-10-CM | POA: Diagnosis not present

## 2019-04-26 DIAGNOSIS — R471 Dysarthria and anarthria: Secondary | ICD-10-CM | POA: Diagnosis not present

## 2019-04-26 DIAGNOSIS — R262 Difficulty in walking, not elsewhere classified: Secondary | ICD-10-CM | POA: Diagnosis not present

## 2019-04-26 DIAGNOSIS — R41841 Cognitive communication deficit: Secondary | ICD-10-CM | POA: Diagnosis not present

## 2019-04-26 DIAGNOSIS — M6281 Muscle weakness (generalized): Secondary | ICD-10-CM | POA: Diagnosis not present

## 2019-04-27 DIAGNOSIS — M6281 Muscle weakness (generalized): Secondary | ICD-10-CM | POA: Diagnosis not present

## 2019-04-27 DIAGNOSIS — R471 Dysarthria and anarthria: Secondary | ICD-10-CM | POA: Diagnosis not present

## 2019-04-27 DIAGNOSIS — G9341 Metabolic encephalopathy: Secondary | ICD-10-CM | POA: Diagnosis not present

## 2019-04-27 DIAGNOSIS — R262 Difficulty in walking, not elsewhere classified: Secondary | ICD-10-CM | POA: Diagnosis not present

## 2019-04-27 DIAGNOSIS — R41841 Cognitive communication deficit: Secondary | ICD-10-CM | POA: Diagnosis not present

## 2019-04-28 DIAGNOSIS — E1122 Type 2 diabetes mellitus with diabetic chronic kidney disease: Secondary | ICD-10-CM | POA: Diagnosis not present

## 2019-04-28 DIAGNOSIS — R41841 Cognitive communication deficit: Secondary | ICD-10-CM | POA: Diagnosis not present

## 2019-04-28 DIAGNOSIS — G9341 Metabolic encephalopathy: Secondary | ICD-10-CM | POA: Diagnosis not present

## 2019-04-28 DIAGNOSIS — R262 Difficulty in walking, not elsewhere classified: Secondary | ICD-10-CM | POA: Diagnosis not present

## 2019-04-28 DIAGNOSIS — Z992 Dependence on renal dialysis: Secondary | ICD-10-CM | POA: Diagnosis not present

## 2019-04-28 DIAGNOSIS — N2581 Secondary hyperparathyroidism of renal origin: Secondary | ICD-10-CM | POA: Diagnosis not present

## 2019-04-28 DIAGNOSIS — R471 Dysarthria and anarthria: Secondary | ICD-10-CM | POA: Diagnosis not present

## 2019-04-28 DIAGNOSIS — M6281 Muscle weakness (generalized): Secondary | ICD-10-CM | POA: Diagnosis not present

## 2019-04-28 DIAGNOSIS — N186 End stage renal disease: Secondary | ICD-10-CM | POA: Diagnosis not present

## 2019-04-28 DIAGNOSIS — I480 Paroxysmal atrial fibrillation: Secondary | ICD-10-CM | POA: Diagnosis not present

## 2019-04-28 DIAGNOSIS — D509 Iron deficiency anemia, unspecified: Secondary | ICD-10-CM | POA: Diagnosis not present

## 2019-04-28 DIAGNOSIS — I5032 Chronic diastolic (congestive) heart failure: Secondary | ICD-10-CM | POA: Diagnosis not present

## 2019-04-29 DIAGNOSIS — R41841 Cognitive communication deficit: Secondary | ICD-10-CM | POA: Diagnosis not present

## 2019-04-29 DIAGNOSIS — G9341 Metabolic encephalopathy: Secondary | ICD-10-CM | POA: Diagnosis not present

## 2019-04-29 DIAGNOSIS — M6281 Muscle weakness (generalized): Secondary | ICD-10-CM | POA: Diagnosis not present

## 2019-04-29 DIAGNOSIS — R471 Dysarthria and anarthria: Secondary | ICD-10-CM | POA: Diagnosis not present

## 2019-04-29 DIAGNOSIS — L89610 Pressure ulcer of right heel, unstageable: Secondary | ICD-10-CM | POA: Diagnosis not present

## 2019-04-29 DIAGNOSIS — R262 Difficulty in walking, not elsewhere classified: Secondary | ICD-10-CM | POA: Diagnosis not present

## 2019-04-30 DIAGNOSIS — E1122 Type 2 diabetes mellitus with diabetic chronic kidney disease: Secondary | ICD-10-CM | POA: Diagnosis not present

## 2019-04-30 DIAGNOSIS — R471 Dysarthria and anarthria: Secondary | ICD-10-CM | POA: Diagnosis not present

## 2019-04-30 DIAGNOSIS — Z992 Dependence on renal dialysis: Secondary | ICD-10-CM | POA: Diagnosis not present

## 2019-04-30 DIAGNOSIS — N2581 Secondary hyperparathyroidism of renal origin: Secondary | ICD-10-CM | POA: Diagnosis not present

## 2019-04-30 DIAGNOSIS — D509 Iron deficiency anemia, unspecified: Secondary | ICD-10-CM | POA: Diagnosis not present

## 2019-04-30 DIAGNOSIS — R262 Difficulty in walking, not elsewhere classified: Secondary | ICD-10-CM | POA: Diagnosis not present

## 2019-04-30 DIAGNOSIS — Z724 Inappropriate diet and eating habits: Secondary | ICD-10-CM | POA: Insufficient documentation

## 2019-04-30 DIAGNOSIS — M6281 Muscle weakness (generalized): Secondary | ICD-10-CM | POA: Diagnosis not present

## 2019-04-30 DIAGNOSIS — R41841 Cognitive communication deficit: Secondary | ICD-10-CM | POA: Diagnosis not present

## 2019-04-30 DIAGNOSIS — G9341 Metabolic encephalopathy: Secondary | ICD-10-CM | POA: Diagnosis not present

## 2019-04-30 DIAGNOSIS — N186 End stage renal disease: Secondary | ICD-10-CM | POA: Diagnosis not present

## 2019-05-01 DIAGNOSIS — M6281 Muscle weakness (generalized): Secondary | ICD-10-CM | POA: Diagnosis not present

## 2019-05-01 DIAGNOSIS — R41841 Cognitive communication deficit: Secondary | ICD-10-CM | POA: Diagnosis not present

## 2019-05-01 DIAGNOSIS — E119 Type 2 diabetes mellitus without complications: Secondary | ICD-10-CM | POA: Diagnosis not present

## 2019-05-01 DIAGNOSIS — R262 Difficulty in walking, not elsewhere classified: Secondary | ICD-10-CM | POA: Diagnosis not present

## 2019-05-01 DIAGNOSIS — R471 Dysarthria and anarthria: Secondary | ICD-10-CM | POA: Diagnosis not present

## 2019-05-01 DIAGNOSIS — G9341 Metabolic encephalopathy: Secondary | ICD-10-CM | POA: Diagnosis not present

## 2019-05-01 DIAGNOSIS — R6889 Other general symptoms and signs: Secondary | ICD-10-CM | POA: Diagnosis not present

## 2019-05-02 DIAGNOSIS — N186 End stage renal disease: Secondary | ICD-10-CM | POA: Diagnosis not present

## 2019-05-02 DIAGNOSIS — E1122 Type 2 diabetes mellitus with diabetic chronic kidney disease: Secondary | ICD-10-CM | POA: Diagnosis not present

## 2019-05-02 DIAGNOSIS — D509 Iron deficiency anemia, unspecified: Secondary | ICD-10-CM | POA: Diagnosis not present

## 2019-05-02 DIAGNOSIS — Z992 Dependence on renal dialysis: Secondary | ICD-10-CM | POA: Diagnosis not present

## 2019-05-02 DIAGNOSIS — N2581 Secondary hyperparathyroidism of renal origin: Secondary | ICD-10-CM | POA: Diagnosis not present

## 2019-05-03 DIAGNOSIS — R262 Difficulty in walking, not elsewhere classified: Secondary | ICD-10-CM | POA: Diagnosis not present

## 2019-05-03 DIAGNOSIS — R471 Dysarthria and anarthria: Secondary | ICD-10-CM | POA: Diagnosis not present

## 2019-05-03 DIAGNOSIS — N186 End stage renal disease: Secondary | ICD-10-CM | POA: Diagnosis not present

## 2019-05-03 DIAGNOSIS — Z992 Dependence on renal dialysis: Secondary | ICD-10-CM | POA: Diagnosis not present

## 2019-05-03 DIAGNOSIS — G9341 Metabolic encephalopathy: Secondary | ICD-10-CM | POA: Diagnosis not present

## 2019-05-03 DIAGNOSIS — R41841 Cognitive communication deficit: Secondary | ICD-10-CM | POA: Diagnosis not present

## 2019-05-03 DIAGNOSIS — M6281 Muscle weakness (generalized): Secondary | ICD-10-CM | POA: Diagnosis not present

## 2019-05-04 ENCOUNTER — Other Ambulatory Visit: Payer: Self-pay

## 2019-05-04 ENCOUNTER — Ambulatory Visit (INDEPENDENT_AMBULATORY_CARE_PROVIDER_SITE_OTHER): Payer: Medicare Other | Admitting: Nurse Practitioner

## 2019-05-04 VITALS — BP 124/54 | HR 61 | Resp 16

## 2019-05-04 DIAGNOSIS — R5381 Other malaise: Secondary | ICD-10-CM | POA: Diagnosis not present

## 2019-05-04 DIAGNOSIS — R1312 Dysphagia, oropharyngeal phase: Secondary | ICD-10-CM | POA: Diagnosis not present

## 2019-05-04 DIAGNOSIS — Z8616 Personal history of COVID-19: Secondary | ICD-10-CM | POA: Diagnosis not present

## 2019-05-04 DIAGNOSIS — Z741 Need for assistance with personal care: Secondary | ICD-10-CM | POA: Diagnosis not present

## 2019-05-04 DIAGNOSIS — I10 Essential (primary) hypertension: Secondary | ICD-10-CM | POA: Diagnosis not present

## 2019-05-04 DIAGNOSIS — G9341 Metabolic encephalopathy: Secondary | ICD-10-CM | POA: Diagnosis not present

## 2019-05-04 DIAGNOSIS — M6281 Muscle weakness (generalized): Secondary | ICD-10-CM | POA: Diagnosis not present

## 2019-05-04 DIAGNOSIS — N186 End stage renal disease: Secondary | ICD-10-CM

## 2019-05-04 DIAGNOSIS — R404 Transient alteration of awareness: Secondary | ICD-10-CM

## 2019-05-04 DIAGNOSIS — R41841 Cognitive communication deficit: Secondary | ICD-10-CM | POA: Diagnosis not present

## 2019-05-05 ENCOUNTER — Encounter (INDEPENDENT_AMBULATORY_CARE_PROVIDER_SITE_OTHER): Payer: Self-pay | Admitting: Nurse Practitioner

## 2019-05-05 DIAGNOSIS — D631 Anemia in chronic kidney disease: Secondary | ICD-10-CM | POA: Diagnosis not present

## 2019-05-05 DIAGNOSIS — E1122 Type 2 diabetes mellitus with diabetic chronic kidney disease: Secondary | ICD-10-CM | POA: Diagnosis not present

## 2019-05-05 DIAGNOSIS — N2581 Secondary hyperparathyroidism of renal origin: Secondary | ICD-10-CM | POA: Diagnosis not present

## 2019-05-05 DIAGNOSIS — N186 End stage renal disease: Secondary | ICD-10-CM | POA: Diagnosis not present

## 2019-05-05 DIAGNOSIS — R41841 Cognitive communication deficit: Secondary | ICD-10-CM | POA: Diagnosis not present

## 2019-05-05 DIAGNOSIS — R1312 Dysphagia, oropharyngeal phase: Secondary | ICD-10-CM | POA: Diagnosis not present

## 2019-05-05 DIAGNOSIS — Z992 Dependence on renal dialysis: Secondary | ICD-10-CM | POA: Diagnosis not present

## 2019-05-05 DIAGNOSIS — D509 Iron deficiency anemia, unspecified: Secondary | ICD-10-CM | POA: Diagnosis not present

## 2019-05-05 DIAGNOSIS — G9341 Metabolic encephalopathy: Secondary | ICD-10-CM | POA: Diagnosis not present

## 2019-05-05 DIAGNOSIS — R5381 Other malaise: Secondary | ICD-10-CM | POA: Diagnosis not present

## 2019-05-05 DIAGNOSIS — Z741 Need for assistance with personal care: Secondary | ICD-10-CM | POA: Diagnosis not present

## 2019-05-05 NOTE — Progress Notes (Signed)
SUBJECTIVE:  [name], female    DOB: [birth year], 64 y.o.   MRN: [id number] No chief complaint on file.   HPI  Jamie Arias is a 64 y.o. female returns today for wound follow-up.  However, due to COVID-19 and other restrictions it has been approximately 2 months since her intervention.  On 03/04/2019 the patient underwent a removal of the right AV graft with primary reconstruction of her right brachial artery with a VAC placement.  This was due to to persistent bacteremia and suspected infection of the graft.  During this time the patient states that she was barely alert and she does not remember any of the time that she was in the hospital.  Currently she is maintained via PermCath without issue.  The sutures/staples were removed from the patient's arm at her facility and it is completely healed today.  She denies any fever, chills, nausea, vomiting or diarrhea.  Overall she feels better but she is still weak and still at a facility.  Currently the patient has no permanent dialysis access.  Past Medical History:  Diagnosis Date  . Anemia   . Chronic diastolic CHF (congestive heart failure) ([place])    a. Due to ischemic cardiomyopathy. EF as low as 35%, improved to normal s/p CABG; b. echo 07/06/13: EF 55-60%, no RWMAs, mod TR, trivial pericardial effusion not c/w tamponade physiology;  c. 10/2015 Echo: EF 65%, Gr1 DD, triv AI, mild MR, mildly dil LA, mod TR, PASP 63mmHg.  Marland Kitchen Coronary artery disease    a. NSTEMI 06/2013; b.cath: severe three-vessel CAD w/ EF 30% & mild-mod MR; c. s/p 3 vessel CABG 07/02/13 (LIMA-LAD, SVG-OM, and SVG-RPDA);  d. 10/2015 MV: no ischemia/infarct.  . Diabetes mellitus without complication ([place])   . Diabetic neuropathy ([place])   . Dialysis patient [facility])    MWF  . ESRD (end stage renal disease) ([place])    a. 12/2015 initiated - mwf dialysis.  Marland Kitchen GERD (gastroesophageal reflux disease)   . Hyperlipidemia   . Hypertension   . Hypothyroidism   . Myocardial  infarction ([place]) 2015  . Neuropathy   . Pleural effusion 2015  . Pulmonary hypertension ([place])   . Renal insufficiency   . Wears dentures    full lower    Past Surgical History:  Procedure Laterality Date  . A/V FISTULAGRAM Right 12/17/2016   Procedure: A/V Fistulagram;  Surgeon: Algernon Huxley, MD;  Location: [place] [facility];  Service: Cardiovascular;  Laterality: Right;  . A/V FISTULAGRAM Right 01/07/2017   Procedure: A/V Fistulagram;  Surgeon: Algernon Huxley, MD;  Location: [place] [facility];  Service: Cardiovascular;  Laterality: Right;  . A/V FISTULAGRAM Right 12/03/2017   Procedure: A/V FISTULAGRAM;  Surgeon: Katha Cabal, MD;  Location: [place] [facility];  Service: Cardiovascular;  Laterality: Right;  . A/V FISTULAGRAM Right 02/23/2019   Procedure: A/V Fistulagram;  Surgeon: Algernon Huxley, MD;  Location: [place] [facility];  Service: Cardiovascular;  Laterality: Right;  . A/V SHUNT INTERVENTION N/A 02/22/2017   Procedure: A/V SHUNT INTERVENTION;  Surgeon: Algernon Huxley, MD;  Location: [place] [facility];  Service: Cardiovascular;  Laterality: N/A;  . AV FISTULA PLACEMENT Right 02/03/2016   Procedure: INSERTION OF ARTERIOVENOUS (AV) GORE-TEX GRAFT ARM ( BRACH / AXILLARY );  Surgeon: Katha Cabal, MD;  Location: [facility];  Service: Vascular;  Laterality: Right;  . [place] REMOVAL Right 03/04/2019   Procedure: REMOVAL OF ARTERIOVENOUS GORETEX GRAFT (  [place]);  Surgeon: Algernon Huxley, MD;  Location: [facility];  Service: Vascular;  Laterality: Right;  . CARDIAC CATHETERIZATION    . CATARACT EXTRACTION Bilateral   . CHOLECYSTECTOMY N/A 12/09/2014   Procedure: LAPAROSCOPIC CHOLECYSTECTOMY;  Surgeon: Marlyce [name], MD;  Location: [facility];  Service: General;  Laterality: N/A;  . CORONARY ARTERY BYPASS GRAFT N/A 07/02/2013   Procedure: CORONARY ARTERY BYPASS GRAFTING (CABG);  Surgeon: Ivin [name], MD;  Location: [place];  Service: Open Heart Surgery;  Laterality: N/A;   CABG x three, using left internal mammary artery and right leg greater saphenous vein harvested endoscopically  . ESOPHAGOGASTRODUODENOSCOPY N/A 04/11/2019   Procedure: ESOPHAGOGASTRODUODENOSCOPY (EGD);  Surgeon: Jonathon Bellows, MD;  Location: [facility];  Service: Gastroenterology;  Laterality: N/A;  . ESOPHAGOGASTRODUODENOSCOPY (EGD) WITH PROPOFOL N/A 11/24/2015   Procedure: ESOPHAGOGASTRODUODENOSCOPY (EGD) WITH PROPOFOL;  Surgeon: [name], MD;  Location: [facility];  Service: Endoscopy;  Laterality: N/A;  Diabetic - insulin  . EYE SURGERY Bilateral    Cataract Extraction with IOL  . INTRAOPERATIVE TRANSESOPHAGEAL ECHOCARDIOGRAM N/A 07/02/2013   Procedure: INTRAOPERATIVE TRANSESOPHAGEAL ECHOCARDIOGRAM;  Surgeon: Ivin [name], MD;  Location: [facility];  Service: Open Heart Surgery;  Laterality: N/A;  . PEG PLACEMENT N/A 03/03/2019   Procedure: PERCUTANEOUS ENDOSCOPIC GASTROSTOMY (PEG) PLACEMENT;  Surgeon: Virgel Manifold, MD;  Location: [facility];  Service: Endoscopy;  Laterality: N/A;  . PERIPHERAL VASCULAR CATHETERIZATION Right 12/06/2015   Procedure: Dialysis/Perma Catheter Insertion;  Surgeon: Katha Cabal, MD;  Location: [facility] [facility];  Service: Cardiovascular;  Laterality: Right;  . PERIPHERAL VASCULAR THROMBECTOMY Right 09/28/2016   Procedure: Peripheral Vascular Thrombectomy;  Surgeon: Katha Cabal, MD;  Location: [facility] [facility];  Service: Cardiovascular;  Laterality: Right;  . PERIPHERAL VASCULAR THROMBECTOMY Right 05/20/2018   Procedure: PERIPHERAL VASCULAR THROMBECTOMY;  Surgeon: Katha Cabal, MD;  Location: [place] [facility];  Service: Cardiovascular;  Laterality: Right;  . PORTA CATH REMOVAL N/A 06/01/2016   Procedure: Glori [name] Cath Removal;  Surgeon: Katha Cabal, MD;  Location: [facility] [facility];  Service: Cardiovascular;  Laterality: N/A;  . THORACENTESIS Left 2015    Social History   Socioeconomic History  . Marital  status: Married    Spouse name: Not on file  . Number of children: Not on file  . Years of education: Not on file  . Highest education level: Not on file  Occupational History  . Occupation: works for [employer]  Tobacco Use  . Smoking status: Never Smoker  . Smokeless tobacco: Never Used  Substance and Sexual Activity  . Alcohol use: No    Alcohol/week: 0.0 standard drinks  . Drug use: No  . Sexual activity: Not on file  Other Topics Concern  . Not on file  Social History Narrative   Patient lives at home with her husband. Patient has 4 adult children.   Social Determinants of Health   Financial Resource Strain:   . Difficulty of Paying Living Expenses: Not on file  Food Insecurity:   . Worried About Charity fundraiser in the Last Year: Not on file  . Ran Out of Food in the Last Year: Not on file  Transportation Needs:   . Lack of Transportation (Medical): Not on file  . Lack of Transportation (Non-Medical): Not on file  Physical Activity:   . Days of Exercise per Week: Not on file  . Minutes of Exercise per Session: Not on file  Stress:   .  Feeling of Stress : Not on file  Social Connections:   . Frequency of Communication with Friends and Family: Not on file  . Frequency of Social Gatherings with Friends and Family: Not on file  . Attends Religious Services: Not on file  . Active Member of Clubs or Organizations: Not on file  . Attends Archivist Meetings: Not on file  . Marital Status: Not on file  Intimate Partner Violence:   . Fear of Current or Ex-Partner: Not on file  . Emotionally Abused: Not on file  . Physically Abused: Not on file  . Sexually Abused: Not on file    Family History  Problem Relation Age of Onset  . COPD Mother   . Cancer Mother        Lung  . Pulmonary embolism Father   . Diabetes Father   . Diabetes Paternal Grandfather   . Heart disease Maternal Grandmother   . Colon cancer Neg Hx   . Colon polyps Neg Hx   .  Esophageal cancer Neg Hx   . Pancreatic cancer Neg Hx   . Liver disease Neg Hx     Allergies  Allergen Reactions  . Nsaids Other (See Comments)    Contraindicated due to kidney disease.  Marland Kitchen Doxycycline Other (See Comments)    tremor     Review of Systems   Review of Systems: Negative Unless Checked Constitutional: [] Weight loss  [] Fever  [] Chills Cardiac: [] Chest pain   []  Atrial Fibrillation  [] Palpitations   [] Shortness of breath when laying flat   [] Shortness of breath with exertion. [] Shortness of breath at rest Vascular:  [] Pain in legs with walking   [] Pain in legs with standing [] Pain in legs when laying flat   [] Claudication    [] Pain in feet when laying flat    [] History of DVT   [] Phlebitis   [] Swelling in legs   [] Varicose veins   [] Non-healing ulcers Pulmonary:   [] Uses home oxygen   [] Productive cough   [] Hemoptysis   [] Wheeze  [] COPD   [] Asthma Neurologic:  [] Dizziness   [] Seizures  [] Blackouts [] History of stroke   [] History of TIA  [] Aphasia   [] Temporary Blindness   [x] Weakness or numbness in arm   [x] Weakness or numbness in leg Musculoskeletal:   [] Joint swelling   [] Joint pain   [] Low back pain  []  History of Knee Replacement [] Arthritis [] back Surgeries  []  Spinal Stenosis    Hematologic:  [] Easy bruising  [] Easy bleeding   [] Hypercoagulable state   [x] Anemic Gastrointestinal:  [] Diarrhea   [] Vomiting  [] Gastroesophageal reflux/heartburn   [] Difficulty swallowing. [] Abdominal pain Genitourinary:  [] Chronic kidney disease   [] Difficult urination  [] Anuric   [] Blood in urine [] Frequent urination  [] Burning with urination   [] Hematuria Skin:  [] Rashes   [] Ulcers [] Wounds Psychological:  [] History of anxiety   []  History of major depression  []  Memory Difficulties      OBJECTIVE:   Physical Exam  BP (!) 124/54 (BP Location: Left Arm)   Pulse 61   Resp 16   Gen: WD/WN, NAD Head: [place]/AT, No temporalis wasting.  Ear/Nose/Throat: Hearing grossly intact, nares w/o  erythema or drainage Eyes: PER, EOMI, sclera nonicteric.  Neck: Supple, no masses.  No JVD.  Pulmonary:  Good air movement, no use of accessory muscles.  Cardiac: RRR Vascular:  Vessel Right Left  Radial Palpable Palpable   Gastrointestinal: soft, non-distended. No guarding/no peritoneal signs.  Musculoskeletal: M/S 5/5 throughout.  No deformity or atrophy.  Neurologic: Pain and light touch intact in extremities.  Symmetrical.  Speech is fluent. Motor exam as listed above. Psychiatric: Judgment intact, Mood & affect appropriate for pt's clinical situation.        ASSESSMENT AND PLAN:  1. ESRD (end stage renal disease) ([place]) Recommend:  The patient currently has catheter based access.  Patient should have vein mapping of his arms to plan for upper extremity access creation.  The goal is to improve the quality of dialysis therapy.  The risks, benefits and alternative therapies with respect to to the different kinds of dialysis accesee were reviewed in detail with the patient.  All questions were answered.  The patient agrees to proceed with mapping.     2. Essential hypertension Good blood pressure control today.  Patient on appropriate medications.  No changes needed.  3. Altered level of consciousness Today patient is appropriate with her answers no evidence of altered consciousness.   Current Outpatient Medications on File Prior to Visit  Medication Sig Dispense Refill  . acetaminophen (TYLENOL) 325 MG tablet Take 650 mg by mouth daily as needed for moderate pain or headache.     . Amino Acids-Protein Hydrolys (FEEDING SUPPLEMENT, PRO-STAT SUGAR FREE 64,) LIQD Place 30 mLs into feeding tube daily. 887 mL 0  . aspirin EC 81 MG tablet Take 81 mg by mouth daily.    Marland Kitchen atorvastatin (LIPITOR) 40 MG tablet Take 40 mg by mouth daily.    Marland Kitchen ELIQUIS 5 MG TABS tablet Take 5 mg by mouth 2 (two) times daily.     . ferrous gluconate (FERGON) 324 MG tablet Take 1 tablet (324 mg total) by  mouth daily.  3  . FLUoxetine (PROZAC) 20 MG capsule Take 20 mg by mouth daily.    . fluticasone (FLONASE) 50 MCG/ACT nasal spray Place 2 sprays into both nostrils daily. (Patient taking differently: Place 2 sprays into both nostrils daily as needed for allergies or rhinitis. ) 16 g 6  . folic acid (FOLVITE) 1 MG tablet Take 1 tablet (1 mg total) by mouth daily.    Marland Kitchen gabapentin (NEURONTIN) 100 MG capsule Take 100 mg by mouth 3 (three) times daily.    . hydrocortisone cream 1 % Apply topically as needed for itching. 30 g 0  . ipratropium-albuterol (DUONEB) 0.5-2.5 (3) MG/3ML SOLN Take 3 mLs by nebulization every 4 (four) hours as needed. 360 mL   . levothyroxine (SYNTHROID) 88 MCG tablet Take 88 mcg by mouth daily before breakfast.    . Melatonin 3 MG TABS Take by mouth at bedtime.    . multivitamin (RENA-VIT) TABS tablet Take 1 tablet by mouth at bedtime.  0  . nitroGLYCERIN (NITROSTAT) 0.4 MG SL tablet DISSOLVE ONE TABLET UNDER THE TONGUE EVERY 5 MINUTES AS NEEDED FOR CHEST PAIN.  DO NOT EXCEED A TOTAL OF 3 DOSES IN 15 MINUTES (Patient taking differently: Place 0.4 mg under the tongue every 5 (five) minutes as needed for chest pain. DISSOLVE ONE TABLET UNDER THE TONGUE EVERY 5 MINUTES AS NEEDED FOR CHEST PAIN.  DO NOT EXCEED A TOTAL OF 3 DOSES IN 15 MINUTES) 25 tablet 0  . Nutritional Supplements (FEEDING SUPPLEMENT, OSMOLITE 1.5 CAL,) LIQD Place 1,000 mLs into feeding tube continuous.  0  . nystatin (MYCOSTATIN/NYSTOP) powder Apply 1 application topically 2 (two) times daily.    . ondansetron (ZOFRAN-ODT) 4 MG disintegrating tablet Take 1 tablet (4 mg total) by mouth every 8 (eight) hours as needed for nausea or vomiting. appt  with PCP further refills Dr. Caryl Bis 30 tablet 0  . pantoprazole sodium (PROTONIX) 40 mg/20 mL PACK Place 20 mLs (40 mg total) into feeding tube 2 (two) times daily. 30 mL   . SANTYL ointment     . senna-docusate (SENOKOT-S) 8.6-50 MG tablet Take 1 tablet by mouth at  bedtime.    . sodium zirconium cyclosilicate (LOKELMA) 10 g PACK packet Take 10 g by mouth daily.    . vitamin C (VITAMIN C) 500 MG tablet Take 1 tablet (500 mg total) by mouth daily.    . Water For Irrigation, Sterile (FREE WATER) SOLN Place 20 mLs into feeding tube every 4 (four) hours.    Marland Kitchen zinc sulfate 220 (50 Zn) MG capsule Take 1 capsule (220 mg total) by mouth daily.     No current facility-administered medications on file prior to visit.    There are no Patient Instructions on file for this visit. No follow-ups on file.   Kris Hartmann, NP  This note was completed with Sales executive.  Any errors are purely unintentional.

## 2019-05-06 DIAGNOSIS — G9341 Metabolic encephalopathy: Secondary | ICD-10-CM | POA: Diagnosis not present

## 2019-05-06 DIAGNOSIS — I10 Essential (primary) hypertension: Secondary | ICD-10-CM | POA: Diagnosis not present

## 2019-05-06 DIAGNOSIS — N186 End stage renal disease: Secondary | ICD-10-CM | POA: Diagnosis not present

## 2019-05-06 DIAGNOSIS — E11649 Type 2 diabetes mellitus with hypoglycemia without coma: Secondary | ICD-10-CM | POA: Diagnosis not present

## 2019-05-06 DIAGNOSIS — L89610 Pressure ulcer of right heel, unstageable: Secondary | ICD-10-CM | POA: Diagnosis not present

## 2019-05-06 DIAGNOSIS — I5032 Chronic diastolic (congestive) heart failure: Secondary | ICD-10-CM | POA: Diagnosis not present

## 2019-05-06 DIAGNOSIS — R41841 Cognitive communication deficit: Secondary | ICD-10-CM | POA: Diagnosis not present

## 2019-05-06 DIAGNOSIS — Z741 Need for assistance with personal care: Secondary | ICD-10-CM | POA: Diagnosis not present

## 2019-05-06 DIAGNOSIS — R1312 Dysphagia, oropharyngeal phase: Secondary | ICD-10-CM | POA: Diagnosis not present

## 2019-05-06 DIAGNOSIS — R5381 Other malaise: Secondary | ICD-10-CM | POA: Diagnosis not present

## 2019-05-07 DIAGNOSIS — G9341 Metabolic encephalopathy: Secondary | ICD-10-CM | POA: Diagnosis not present

## 2019-05-07 DIAGNOSIS — E1122 Type 2 diabetes mellitus with diabetic chronic kidney disease: Secondary | ICD-10-CM | POA: Diagnosis not present

## 2019-05-07 DIAGNOSIS — Z741 Need for assistance with personal care: Secondary | ICD-10-CM | POA: Diagnosis not present

## 2019-05-07 DIAGNOSIS — R1312 Dysphagia, oropharyngeal phase: Secondary | ICD-10-CM | POA: Diagnosis not present

## 2019-05-07 DIAGNOSIS — N186 End stage renal disease: Secondary | ICD-10-CM | POA: Diagnosis not present

## 2019-05-07 DIAGNOSIS — R5381 Other malaise: Secondary | ICD-10-CM | POA: Diagnosis not present

## 2019-05-07 DIAGNOSIS — R41841 Cognitive communication deficit: Secondary | ICD-10-CM | POA: Diagnosis not present

## 2019-05-07 DIAGNOSIS — D509 Iron deficiency anemia, unspecified: Secondary | ICD-10-CM | POA: Diagnosis not present

## 2019-05-07 DIAGNOSIS — Z992 Dependence on renal dialysis: Secondary | ICD-10-CM | POA: Diagnosis not present

## 2019-05-07 DIAGNOSIS — D631 Anemia in chronic kidney disease: Secondary | ICD-10-CM | POA: Diagnosis not present

## 2019-05-08 DIAGNOSIS — G9341 Metabolic encephalopathy: Secondary | ICD-10-CM | POA: Diagnosis not present

## 2019-05-08 DIAGNOSIS — R1312 Dysphagia, oropharyngeal phase: Secondary | ICD-10-CM | POA: Diagnosis not present

## 2019-05-08 DIAGNOSIS — R5381 Other malaise: Secondary | ICD-10-CM | POA: Diagnosis not present

## 2019-05-08 DIAGNOSIS — N186 End stage renal disease: Secondary | ICD-10-CM | POA: Diagnosis not present

## 2019-05-08 DIAGNOSIS — R41841 Cognitive communication deficit: Secondary | ICD-10-CM | POA: Diagnosis not present

## 2019-05-08 DIAGNOSIS — Z741 Need for assistance with personal care: Secondary | ICD-10-CM | POA: Diagnosis not present

## 2019-05-09 DIAGNOSIS — E1122 Type 2 diabetes mellitus with diabetic chronic kidney disease: Secondary | ICD-10-CM | POA: Diagnosis not present

## 2019-05-09 DIAGNOSIS — D631 Anemia in chronic kidney disease: Secondary | ICD-10-CM | POA: Diagnosis not present

## 2019-05-09 DIAGNOSIS — Z992 Dependence on renal dialysis: Secondary | ICD-10-CM | POA: Diagnosis not present

## 2019-05-09 DIAGNOSIS — D509 Iron deficiency anemia, unspecified: Secondary | ICD-10-CM | POA: Diagnosis not present

## 2019-05-09 DIAGNOSIS — N186 End stage renal disease: Secondary | ICD-10-CM | POA: Diagnosis not present

## 2019-05-10 DIAGNOSIS — Z741 Need for assistance with personal care: Secondary | ICD-10-CM | POA: Diagnosis not present

## 2019-05-10 DIAGNOSIS — N186 End stage renal disease: Secondary | ICD-10-CM | POA: Diagnosis not present

## 2019-05-10 DIAGNOSIS — G9341 Metabolic encephalopathy: Secondary | ICD-10-CM | POA: Diagnosis not present

## 2019-05-10 DIAGNOSIS — R41841 Cognitive communication deficit: Secondary | ICD-10-CM | POA: Diagnosis not present

## 2019-05-10 DIAGNOSIS — R5381 Other malaise: Secondary | ICD-10-CM | POA: Diagnosis not present

## 2019-05-10 DIAGNOSIS — R1312 Dysphagia, oropharyngeal phase: Secondary | ICD-10-CM | POA: Diagnosis not present

## 2019-05-11 DIAGNOSIS — Z741 Need for assistance with personal care: Secondary | ICD-10-CM | POA: Diagnosis not present

## 2019-05-11 DIAGNOSIS — N186 End stage renal disease: Secondary | ICD-10-CM | POA: Diagnosis not present

## 2019-05-11 DIAGNOSIS — R5381 Other malaise: Secondary | ICD-10-CM | POA: Diagnosis not present

## 2019-05-11 DIAGNOSIS — R1312 Dysphagia, oropharyngeal phase: Secondary | ICD-10-CM | POA: Diagnosis not present

## 2019-05-11 DIAGNOSIS — R41841 Cognitive communication deficit: Secondary | ICD-10-CM | POA: Diagnosis not present

## 2019-05-11 DIAGNOSIS — G9341 Metabolic encephalopathy: Secondary | ICD-10-CM | POA: Diagnosis not present

## 2019-05-12 DIAGNOSIS — I5032 Chronic diastolic (congestive) heart failure: Secondary | ICD-10-CM | POA: Diagnosis not present

## 2019-05-12 DIAGNOSIS — E1122 Type 2 diabetes mellitus with diabetic chronic kidney disease: Secondary | ICD-10-CM | POA: Diagnosis not present

## 2019-05-12 DIAGNOSIS — R1312 Dysphagia, oropharyngeal phase: Secondary | ICD-10-CM | POA: Diagnosis not present

## 2019-05-12 DIAGNOSIS — Z992 Dependence on renal dialysis: Secondary | ICD-10-CM | POA: Diagnosis not present

## 2019-05-12 DIAGNOSIS — E11649 Type 2 diabetes mellitus with hypoglycemia without coma: Secondary | ICD-10-CM | POA: Diagnosis not present

## 2019-05-12 DIAGNOSIS — R41841 Cognitive communication deficit: Secondary | ICD-10-CM | POA: Diagnosis not present

## 2019-05-12 DIAGNOSIS — Z741 Need for assistance with personal care: Secondary | ICD-10-CM | POA: Diagnosis not present

## 2019-05-12 DIAGNOSIS — D509 Iron deficiency anemia, unspecified: Secondary | ICD-10-CM | POA: Diagnosis not present

## 2019-05-12 DIAGNOSIS — N186 End stage renal disease: Secondary | ICD-10-CM | POA: Diagnosis not present

## 2019-05-12 DIAGNOSIS — G9341 Metabolic encephalopathy: Secondary | ICD-10-CM | POA: Diagnosis not present

## 2019-05-12 DIAGNOSIS — I480 Paroxysmal atrial fibrillation: Secondary | ICD-10-CM | POA: Diagnosis not present

## 2019-05-12 DIAGNOSIS — R5381 Other malaise: Secondary | ICD-10-CM | POA: Diagnosis not present

## 2019-05-12 DIAGNOSIS — D631 Anemia in chronic kidney disease: Secondary | ICD-10-CM | POA: Diagnosis not present

## 2019-05-13 DIAGNOSIS — Z741 Need for assistance with personal care: Secondary | ICD-10-CM | POA: Diagnosis not present

## 2019-05-13 DIAGNOSIS — E7089 Other disorders of aromatic amino-acid metabolism: Secondary | ICD-10-CM | POA: Diagnosis not present

## 2019-05-13 DIAGNOSIS — N186 End stage renal disease: Secondary | ICD-10-CM | POA: Diagnosis not present

## 2019-05-13 DIAGNOSIS — R6889 Other general symptoms and signs: Secondary | ICD-10-CM | POA: Diagnosis not present

## 2019-05-13 DIAGNOSIS — R41841 Cognitive communication deficit: Secondary | ICD-10-CM | POA: Diagnosis not present

## 2019-05-13 DIAGNOSIS — R5381 Other malaise: Secondary | ICD-10-CM | POA: Diagnosis not present

## 2019-05-13 DIAGNOSIS — E119 Type 2 diabetes mellitus without complications: Secondary | ICD-10-CM | POA: Diagnosis not present

## 2019-05-13 DIAGNOSIS — L89613 Pressure ulcer of right heel, stage 3: Secondary | ICD-10-CM | POA: Diagnosis not present

## 2019-05-13 DIAGNOSIS — R1312 Dysphagia, oropharyngeal phase: Secondary | ICD-10-CM | POA: Diagnosis not present

## 2019-05-13 DIAGNOSIS — G9341 Metabolic encephalopathy: Secondary | ICD-10-CM | POA: Diagnosis not present

## 2019-05-14 ENCOUNTER — Other Ambulatory Visit (INDEPENDENT_AMBULATORY_CARE_PROVIDER_SITE_OTHER): Payer: Self-pay | Admitting: Nurse Practitioner

## 2019-05-14 DIAGNOSIS — G9341 Metabolic encephalopathy: Secondary | ICD-10-CM | POA: Diagnosis not present

## 2019-05-14 DIAGNOSIS — N186 End stage renal disease: Secondary | ICD-10-CM

## 2019-05-14 DIAGNOSIS — D631 Anemia in chronic kidney disease: Secondary | ICD-10-CM | POA: Diagnosis not present

## 2019-05-14 DIAGNOSIS — D509 Iron deficiency anemia, unspecified: Secondary | ICD-10-CM | POA: Diagnosis not present

## 2019-05-14 DIAGNOSIS — R5381 Other malaise: Secondary | ICD-10-CM | POA: Diagnosis not present

## 2019-05-14 DIAGNOSIS — Z992 Dependence on renal dialysis: Secondary | ICD-10-CM | POA: Diagnosis not present

## 2019-05-14 DIAGNOSIS — R1312 Dysphagia, oropharyngeal phase: Secondary | ICD-10-CM | POA: Diagnosis not present

## 2019-05-14 DIAGNOSIS — Z741 Need for assistance with personal care: Secondary | ICD-10-CM | POA: Diagnosis not present

## 2019-05-14 DIAGNOSIS — I82622 Acute embolism and thrombosis of deep veins of left upper extremity: Secondary | ICD-10-CM

## 2019-05-14 DIAGNOSIS — R41841 Cognitive communication deficit: Secondary | ICD-10-CM | POA: Diagnosis not present

## 2019-05-14 DIAGNOSIS — E1122 Type 2 diabetes mellitus with diabetic chronic kidney disease: Secondary | ICD-10-CM | POA: Diagnosis not present

## 2019-05-16 DIAGNOSIS — D509 Iron deficiency anemia, unspecified: Secondary | ICD-10-CM | POA: Diagnosis not present

## 2019-05-16 DIAGNOSIS — Z992 Dependence on renal dialysis: Secondary | ICD-10-CM | POA: Diagnosis not present

## 2019-05-16 DIAGNOSIS — D631 Anemia in chronic kidney disease: Secondary | ICD-10-CM | POA: Diagnosis not present

## 2019-05-16 DIAGNOSIS — E1122 Type 2 diabetes mellitus with diabetic chronic kidney disease: Secondary | ICD-10-CM | POA: Diagnosis not present

## 2019-05-16 DIAGNOSIS — N186 End stage renal disease: Secondary | ICD-10-CM | POA: Diagnosis not present

## 2019-05-17 DIAGNOSIS — R1312 Dysphagia, oropharyngeal phase: Secondary | ICD-10-CM | POA: Diagnosis not present

## 2019-05-17 DIAGNOSIS — R41841 Cognitive communication deficit: Secondary | ICD-10-CM | POA: Diagnosis not present

## 2019-05-17 DIAGNOSIS — Z741 Need for assistance with personal care: Secondary | ICD-10-CM | POA: Diagnosis not present

## 2019-05-17 DIAGNOSIS — R5381 Other malaise: Secondary | ICD-10-CM | POA: Diagnosis not present

## 2019-05-17 DIAGNOSIS — G9341 Metabolic encephalopathy: Secondary | ICD-10-CM | POA: Diagnosis not present

## 2019-05-17 DIAGNOSIS — N186 End stage renal disease: Secondary | ICD-10-CM | POA: Diagnosis not present

## 2019-05-18 ENCOUNTER — Other Ambulatory Visit: Payer: Self-pay

## 2019-05-18 ENCOUNTER — Ambulatory Visit (INDEPENDENT_AMBULATORY_CARE_PROVIDER_SITE_OTHER): Payer: Medicare Other

## 2019-05-18 ENCOUNTER — Ambulatory Visit (INDEPENDENT_AMBULATORY_CARE_PROVIDER_SITE_OTHER): Payer: Medicare Other | Admitting: Nurse Practitioner

## 2019-05-18 ENCOUNTER — Encounter (INDEPENDENT_AMBULATORY_CARE_PROVIDER_SITE_OTHER): Payer: Self-pay | Admitting: Nurse Practitioner

## 2019-05-18 VITALS — BP 189/81 | HR 65 | Resp 16

## 2019-05-18 DIAGNOSIS — R1312 Dysphagia, oropharyngeal phase: Secondary | ICD-10-CM | POA: Diagnosis not present

## 2019-05-18 DIAGNOSIS — Z741 Need for assistance with personal care: Secondary | ICD-10-CM | POA: Diagnosis not present

## 2019-05-18 DIAGNOSIS — R5381 Other malaise: Secondary | ICD-10-CM | POA: Diagnosis not present

## 2019-05-18 DIAGNOSIS — I82622 Acute embolism and thrombosis of deep veins of left upper extremity: Secondary | ICD-10-CM | POA: Diagnosis not present

## 2019-05-18 DIAGNOSIS — N186 End stage renal disease: Secondary | ICD-10-CM | POA: Diagnosis not present

## 2019-05-18 DIAGNOSIS — G9341 Metabolic encephalopathy: Secondary | ICD-10-CM | POA: Diagnosis not present

## 2019-05-18 DIAGNOSIS — R41841 Cognitive communication deficit: Secondary | ICD-10-CM | POA: Diagnosis not present

## 2019-05-18 DIAGNOSIS — R404 Transient alteration of awareness: Secondary | ICD-10-CM

## 2019-05-19 DIAGNOSIS — E1122 Type 2 diabetes mellitus with diabetic chronic kidney disease: Secondary | ICD-10-CM | POA: Diagnosis not present

## 2019-05-19 DIAGNOSIS — R1312 Dysphagia, oropharyngeal phase: Secondary | ICD-10-CM | POA: Diagnosis not present

## 2019-05-19 DIAGNOSIS — Z741 Need for assistance with personal care: Secondary | ICD-10-CM | POA: Diagnosis not present

## 2019-05-19 DIAGNOSIS — R41841 Cognitive communication deficit: Secondary | ICD-10-CM | POA: Diagnosis not present

## 2019-05-19 DIAGNOSIS — D509 Iron deficiency anemia, unspecified: Secondary | ICD-10-CM | POA: Diagnosis not present

## 2019-05-19 DIAGNOSIS — D631 Anemia in chronic kidney disease: Secondary | ICD-10-CM | POA: Diagnosis not present

## 2019-05-19 DIAGNOSIS — G9341 Metabolic encephalopathy: Secondary | ICD-10-CM | POA: Diagnosis not present

## 2019-05-19 DIAGNOSIS — N186 End stage renal disease: Secondary | ICD-10-CM | POA: Diagnosis not present

## 2019-05-19 DIAGNOSIS — Z992 Dependence on renal dialysis: Secondary | ICD-10-CM | POA: Diagnosis not present

## 2019-05-19 DIAGNOSIS — R5381 Other malaise: Secondary | ICD-10-CM | POA: Diagnosis not present

## 2019-05-20 ENCOUNTER — Encounter (INDEPENDENT_AMBULATORY_CARE_PROVIDER_SITE_OTHER): Payer: Self-pay | Admitting: Nurse Practitioner

## 2019-05-20 DIAGNOSIS — G9341 Metabolic encephalopathy: Secondary | ICD-10-CM | POA: Diagnosis not present

## 2019-05-20 DIAGNOSIS — R5381 Other malaise: Secondary | ICD-10-CM | POA: Diagnosis not present

## 2019-05-20 DIAGNOSIS — N186 End stage renal disease: Secondary | ICD-10-CM | POA: Diagnosis not present

## 2019-05-20 DIAGNOSIS — R41841 Cognitive communication deficit: Secondary | ICD-10-CM | POA: Diagnosis not present

## 2019-05-20 DIAGNOSIS — R1312 Dysphagia, oropharyngeal phase: Secondary | ICD-10-CM | POA: Diagnosis not present

## 2019-05-20 DIAGNOSIS — L89613 Pressure ulcer of right heel, stage 3: Secondary | ICD-10-CM | POA: Diagnosis not present

## 2019-05-20 DIAGNOSIS — Z741 Need for assistance with personal care: Secondary | ICD-10-CM | POA: Diagnosis not present

## 2019-05-20 NOTE — Progress Notes (Signed)
SUBJECTIVE:  [name], female    DOB: [birth year], 64 y.o.   MRN: [id number] Chief Complaint  Patient presents with  . Follow-up    ultrasound follow up    HPI  Jamie Arias is a 64 y.o. female However, due to COVID-19 and other restrictions it has been approximately 2 months since her intervention.  On 03/04/2019 the patient underwent a removal of the right AV graft with primary reconstruction of her right brachial artery with a VAC placement.  This was due to to persistent bacteremia and suspected infection of the graft.  During this time the patient states that she was barely alert and she does not remember any of the time that she was in the hospital.  Currently she is maintained via PermCath without issue. She denies any fever, chills, nausea, vomiting or diarrhea.  Overall she feels better but she is still weak and still at a facility, however she will be going home in about a week. Currently the patient has no permanent dialysis access.  Today the patient underwent left upper extremity vein mapping which revealed adequate access for a left brachial axillary graft.  Past Medical History:  Diagnosis Date  . Anemia   . Chronic diastolic CHF (congestive heart failure) ([place])    a. Due to ischemic cardiomyopathy. EF as low as 35%, improved to normal s/p CABG; b. echo 07/06/13: EF 55-60%, no RWMAs, mod TR, trivial pericardial effusion not c/w tamponade physiology;  c. 10/2015 Echo: EF 65%, Gr1 DD, triv AI, mild MR, mildly dil LA, mod TR, PASP 62mmHg.  Marland Kitchen Coronary artery disease    a. NSTEMI 06/2013; b.cath: severe three-vessel CAD w/ EF 30% & mild-mod MR; c. s/p 3 vessel CABG 07/02/13 (LIMA-LAD, SVG-OM, and SVG-RPDA);  d. 10/2015 MV: no ischemia/infarct.  . Diabetes mellitus without complication ([place])   . Diabetic neuropathy ([place])   . Dialysis patient [facility])    MWF  . ESRD (end stage renal disease) ([place])    a. 12/2015 initiated - mwf dialysis.  Marland Kitchen GERD (gastroesophageal reflux  disease)   . Hyperlipidemia   . Hypertension   . Hypothyroidism   . Myocardial infarction ([place]) 2015  . Neuropathy   . Pleural effusion 2015  . Pulmonary hypertension ([place])   . Renal insufficiency   . Wears dentures    full lower    Past Surgical History:  Procedure Laterality Date  . A/V FISTULAGRAM Right 12/17/2016   Procedure: A/V Fistulagram;  Surgeon: Algernon Huxley, MD;  Location: [place] [facility];  Service: Cardiovascular;  Laterality: Right;  . A/V FISTULAGRAM Right 01/07/2017   Procedure: A/V Fistulagram;  Surgeon: Algernon Huxley, MD;  Location: [place] [facility];  Service: Cardiovascular;  Laterality: Right;  . A/V FISTULAGRAM Right 12/03/2017   Procedure: A/V FISTULAGRAM;  Surgeon: Katha Cabal, MD;  Location: [name] [facility];  Service: Cardiovascular;  Laterality: Right;  . A/V FISTULAGRAM Right 02/23/2019   Procedure: A/V Fistulagram;  Surgeon: Algernon Huxley, MD;  Location: [place] [facility];  Service: Cardiovascular;  Laterality: Right;  . A/V SHUNT INTERVENTION N/A 02/22/2017   Procedure: A/V SHUNT INTERVENTION;  Surgeon: Algernon Huxley, MD;  Location: [place] [facility];  Service: Cardiovascular;  Laterality: N/A;  . AV FISTULA PLACEMENT Right 02/03/2016   Procedure: INSERTION OF ARTERIOVENOUS (AV) GORE-TEX GRAFT ARM ( BRACH / AXILLARY );  Surgeon: Katha Cabal, MD;  Location: [facility];  Service: Vascular;  Laterality:  Right;  Marland Kitchen [place] REMOVAL Right 03/04/2019   Procedure: REMOVAL OF ARTERIOVENOUS GORETEX GRAFT ([place]);  Surgeon: Algernon Huxley, MD;  Location: [facility];  Service: Vascular;  Laterality: Right;  . CARDIAC CATHETERIZATION    . CATARACT EXTRACTION Bilateral   . CHOLECYSTECTOMY N/A 12/09/2014   Procedure: LAPAROSCOPIC CHOLECYSTECTOMY;  Surgeon: Marlyce [name], MD;  Location: [facility];  Service: General;  Laterality: N/A;  . CORONARY ARTERY BYPASS GRAFT N/A 07/02/2013   Procedure: CORONARY ARTERY BYPASS GRAFTING (CABG);  Surgeon: Ivin [name], MD;  Location: [facility];  Service: Open Heart Surgery;  Laterality: N/A;  CABG x three, using left internal mammary artery and right leg greater saphenous vein harvested endoscopically  . ESOPHAGOGASTRODUODENOSCOPY N/A 04/11/2019   Procedure: ESOPHAGOGASTRODUODENOSCOPY (EGD);  Surgeon: Jonathon Bellows, MD;  Location: [facility];  Service: Gastroenterology;  Laterality: N/A;  . ESOPHAGOGASTRODUODENOSCOPY (EGD) WITH PROPOFOL N/A 11/24/2015   Procedure: ESOPHAGOGASTRODUODENOSCOPY (EGD) WITH PROPOFOL;  Surgeon: [name], MD;  Location: [place];  Service: Endoscopy;  Laterality: N/A;  Diabetic - insulin  . EYE SURGERY Bilateral    Cataract Extraction with IOL  . INTRAOPERATIVE TRANSESOPHAGEAL ECHOCARDIOGRAM N/A 07/02/2013   Procedure: INTRAOPERATIVE TRANSESOPHAGEAL ECHOCARDIOGRAM;  Surgeon: Ivin [name], MD;  Location: [facility];  Service: Open Heart Surgery;  Laterality: N/A;  . PEG PLACEMENT N/A 03/03/2019   Procedure: PERCUTANEOUS ENDOSCOPIC GASTROSTOMY (PEG) PLACEMENT;  Surgeon: Virgel Manifold, MD;  Location: [facility];  Service: Endoscopy;  Laterality: N/A;  . PERIPHERAL VASCULAR CATHETERIZATION Right 12/06/2015   Procedure: Dialysis/Perma Catheter Insertion;  Surgeon: Katha Cabal, MD;  Location: [place] [facility];  Service: Cardiovascular;  Laterality: Right;  . PERIPHERAL VASCULAR THROMBECTOMY Right 09/28/2016   Procedure: Peripheral Vascular Thrombectomy;  Surgeon: Katha Cabal, MD;  Location: [place] [facility];  Service: Cardiovascular;  Laterality: Right;  . PERIPHERAL VASCULAR THROMBECTOMY Right 05/20/2018   Procedure: PERIPHERAL VASCULAR THROMBECTOMY;  Surgeon: Katha Cabal, MD;  Location: [place] [facility];  Service: Cardiovascular;  Laterality: Right;  . PORTA CATH REMOVAL N/A 06/01/2016   Procedure: Glori [name] Cath Removal;  Surgeon: Katha Cabal, MD;  Location: [place] [facility];  Service: Cardiovascular;  Laterality: N/A;  .  THORACENTESIS Left 2015    Social History   Socioeconomic History  . Marital status: Married    Spouse name: Not on file  . Number of children: Not on file  . Years of education: Not on file  . Highest education level: Not on file  Occupational History  . Occupation: works for Building [employer]  Tobacco Use  . Smoking status: Never Smoker  . Smokeless tobacco: Never Used  Substance and Sexual Activity  . Alcohol use: No    Alcohol/week: 0.0 standard drinks  . Drug use: No  . Sexual activity: Not on file  Other Topics Concern  . Not on file  Social History Narrative   Patient lives at home with her husband. Patient has 4 adult children.   Social Determinants of Health   Financial Resource Strain:   . Difficulty of Paying Living Expenses:   Food Insecurity:   . Worried About Charity fundraiser in the Last Year:   . Arboriculturist in the Last Year:   Transportation Needs:   . Film/video editor (Medical):   Marland Kitchen Lack of Transportation (Non-Medical):   Physical Activity:   . Days of Exercise per Week:   . Minutes of Exercise per Session:   Stress:   .  Feeling of Stress :   Social Connections:   . Frequency of Communication with Friends and Family:   . Frequency of Social Gatherings with Friends and Family:   . Attends Religious Services:   . Active Member of Clubs or Organizations:   . Attends Archivist Meetings:   Marland Kitchen Marital Status:   Intimate Partner Violence:   . Fear of Current or Ex-Partner:   . Emotionally Abused:   Marland Kitchen Physically Abused:   . Sexually Abused:     Family History  Problem Relation Age of Onset  . COPD Mother   . Cancer Mother        Lung  . Pulmonary embolism Father   . Diabetes Father   . Diabetes Paternal Grandfather   . Heart disease Maternal Grandmother   . Colon cancer Neg Hx   . Colon polyps Neg Hx   . Esophageal cancer Neg Hx   . Pancreatic cancer Neg Hx   . Liver disease Neg Hx     Allergies  Allergen  Reactions  . Nsaids Other (See Comments)    Contraindicated due to kidney disease.  Marland Kitchen Doxycycline Other (See Comments)    tremor     Review of Systems   Review of Systems: Negative Unless Checked Constitutional: [] Weight loss  [] Fever  [] Chills Cardiac: [] Chest pain   [x]  Atrial Fibrillation  [] Palpitations   [] Shortness of breath when laying flat   [] Shortness of breath with exertion. [] Shortness of breath at rest Vascular:  [] Pain in legs with walking   [] Pain in legs with standing [] Pain in legs when laying flat   [] Claudication    [] Pain in feet when laying flat    [] History of DVT   [] Phlebitis   [] Swelling in legs   [] Varicose veins   [] Non-healing ulcers Pulmonary:   [] Uses home oxygen   [] Productive cough   [] Hemoptysis   [] Wheeze  [] COPD   [] Asthma Neurologic:  [] Dizziness   [] Seizures  [] Blackouts [] History of stroke   [] History of TIA  [] Aphasia   [] Temporary Blindness   [] Weakness or numbness in arm   [] Weakness or numbness in leg Musculoskeletal:   [] Joint swelling   [] Joint pain   [] Low back pain  []  History of Knee Replacement [] Arthritis [] back Surgeries  []  Spinal Stenosis    Hematologic:  [] Easy bruising  [] Easy bleeding   [] Hypercoagulable state   [x] Anemic Gastrointestinal:  [] Diarrhea   [] Vomiting  [] Gastroesophageal reflux/heartburn   [] Difficulty swallowing. [] Abdominal pain Genitourinary:  [x] Chronic kidney disease   [] Difficult urination  [] Anuric   [] Blood in urine [] Frequent urination  [] Burning with urination   [] Hematuria Skin:  [] Rashes   [] Ulcers [] Wounds Psychological:  [] History of anxiety   []  History of major depression  []  Memory Difficulties      OBJECTIVE:   Physical Exam  BP (!) 189/81 (BP Location: Left Arm)   Pulse 65   Resp 16   Gen: WD/WN, NAD Head: [place]/AT, No temporalis wasting.  Ear/Nose/Throat: Hearing grossly intact, nares w/o erythema or drainage Eyes: PER, EOMI, sclera nonicteric.  Neck: Supple, no masses.  No JVD.  Pulmonary:  Good  air movement, no use of accessory muscles.  Cardiac: RRR Vascular:  Vessel Right Left  Radial Palpable Palpable   Gastrointestinal: soft, non-distended. No guarding/no peritoneal signs.  Musculoskeletal: Wheelchair-bound. No deformity or atrophy.  Neurologic: Pain and light touch intact in extremities.  Symmetrical.  Speech is fluent. Motor exam as listed above. Psychiatric: Judgment intact, Mood & affect  appropriate for pt's clinical situation. Dermatologic: No Venous rashes. No Ulcers Noted.  No changes consistent with cellulitis. Lymph : No Cervical lymphadenopathy, no lichenification or skin changes of chronic lymphedema.       ASSESSMENT AND PLAN:  1. ESRD (end stage renal disease) ([place]) Recommend:  At this time the patient does not have appropriate extremity access for dialysis  Patient should have a left brachial axillary graft created.  The risks, benefits and alternative therapies were reviewed in detail with the patient.  All questions were answered.  The patient agrees to proceed with surgery.    2. Altered level of consciousness Patient has adequate responses today.  Level of consciousness back to baseline.   Current Outpatient Medications on File Prior to Visit  Medication Sig Dispense Refill  . acetaminophen (TYLENOL) 325 MG tablet Take 650 mg by mouth daily as needed for moderate pain or headache.     . Amino Acids-Protein Hydrolys (FEEDING SUPPLEMENT, PRO-STAT SUGAR FREE 64,) LIQD Place 30 mLs into feeding tube daily. 887 mL 0  . aspirin EC 81 MG tablet Take 81 mg by mouth daily.    Marland Kitchen atorvastatin (LIPITOR) 40 MG tablet Take 40 mg by mouth daily.    Marland Kitchen ELIQUIS 5 MG TABS tablet Take 5 mg by mouth 2 (two) times daily.     . ferrous gluconate (FERGON) 324 MG tablet Take 1 tablet (324 mg total) by mouth daily.  3  . FLUoxetine (PROZAC) 20 MG capsule Take 20 mg by mouth daily.    . fluticasone (FLONASE) 50 MCG/ACT nasal spray Place 2 sprays into both nostrils daily.  (Patient taking differently: Place 2 sprays into both nostrils daily as needed for allergies or rhinitis. ) 16 g 6  . folic acid (FOLVITE) 1 MG tablet Take 1 tablet (1 mg total) by mouth daily.    Marland Kitchen gabapentin (NEURONTIN) 100 MG capsule Take 100 mg by mouth 3 (three) times daily.    . hydrocortisone cream 1 % Apply topically as needed for itching. 30 g 0  . ipratropium-albuterol (DUONEB) 0.5-2.5 (3) MG/3ML SOLN Take 3 mLs by nebulization every 4 (four) hours as needed. 360 mL   . levothyroxine (SYNTHROID) 88 MCG tablet Take 88 mcg by mouth daily before breakfast.    . Melatonin 3 MG TABS Take by mouth at bedtime.    . multivitamin (RENA-VIT) TABS tablet Take 1 tablet by mouth at bedtime.  0  . nitroGLYCERIN (NITROSTAT) 0.4 MG SL tablet DISSOLVE ONE TABLET UNDER THE TONGUE EVERY 5 MINUTES AS NEEDED FOR CHEST PAIN.  DO NOT EXCEED A TOTAL OF 3 DOSES IN 15 MINUTES (Patient taking differently: Place 0.4 mg under the tongue every 5 (five) minutes as needed for chest pain. DISSOLVE ONE TABLET UNDER THE TONGUE EVERY 5 MINUTES AS NEEDED FOR CHEST PAIN.  DO NOT EXCEED A TOTAL OF 3 DOSES IN 15 MINUTES) 25 tablet 0  . nystatin (MYCOSTATIN/NYSTOP) powder Apply 1 application topically 2 (two) times daily.    . ondansetron (ZOFRAN-ODT) 4 MG disintegrating tablet Take 1 tablet (4 mg total) by mouth every 8 (eight) hours as needed for nausea or vomiting. appt with PCP further refills Dr. Caryl Bis 30 tablet 0  . pantoprazole sodium (PROTONIX) 40 mg/20 mL PACK Place 20 mLs (40 mg total) into feeding tube 2 (two) times daily. 30 mL   . SANTYL ointment     . senna-docusate (SENOKOT-S) 8.6-50 MG tablet Take 1 tablet by mouth at bedtime.    Marland Kitchen  sodium zirconium cyclosilicate (LOKELMA) 10 g PACK packet Take 10 g by mouth daily.    . vitamin C (VITAMIN C) 500 MG tablet Take 1 tablet (500 mg total) by mouth daily.    . Water For Irrigation, Sterile (FREE WATER) SOLN Place 20 mLs into feeding tube every 4 (four) hours.    Marland Kitchen  zinc sulfate 220 (50 Zn) MG capsule Take 1 capsule (220 mg total) by mouth daily.    . Nutritional Supplements (FEEDING SUPPLEMENT, OSMOLITE 1.5 CAL,) LIQD Place 1,000 mLs into feeding tube continuous. (Patient not taking: Reported on 05/18/2019)  0   No current facility-administered medications on file prior to visit.    There are no Patient Instructions on file for this visit. No follow-ups on file.   Kris Hartmann, NP  This note was completed with Sales executive.  Any errors are purely unintentional.

## 2019-05-21 DIAGNOSIS — R5381 Other malaise: Secondary | ICD-10-CM | POA: Diagnosis not present

## 2019-05-21 DIAGNOSIS — R41841 Cognitive communication deficit: Secondary | ICD-10-CM | POA: Diagnosis not present

## 2019-05-21 DIAGNOSIS — Z741 Need for assistance with personal care: Secondary | ICD-10-CM | POA: Diagnosis not present

## 2019-05-21 DIAGNOSIS — N186 End stage renal disease: Secondary | ICD-10-CM | POA: Diagnosis not present

## 2019-05-21 DIAGNOSIS — G9341 Metabolic encephalopathy: Secondary | ICD-10-CM | POA: Diagnosis not present

## 2019-05-21 DIAGNOSIS — R1312 Dysphagia, oropharyngeal phase: Secondary | ICD-10-CM | POA: Diagnosis not present

## 2019-05-22 DIAGNOSIS — R1312 Dysphagia, oropharyngeal phase: Secondary | ICD-10-CM | POA: Diagnosis not present

## 2019-05-22 DIAGNOSIS — R41841 Cognitive communication deficit: Secondary | ICD-10-CM | POA: Diagnosis not present

## 2019-05-22 DIAGNOSIS — R5381 Other malaise: Secondary | ICD-10-CM | POA: Diagnosis not present

## 2019-05-22 DIAGNOSIS — N186 End stage renal disease: Secondary | ICD-10-CM | POA: Diagnosis not present

## 2019-05-22 DIAGNOSIS — Z741 Need for assistance with personal care: Secondary | ICD-10-CM | POA: Diagnosis not present

## 2019-05-22 DIAGNOSIS — G9341 Metabolic encephalopathy: Secondary | ICD-10-CM | POA: Diagnosis not present

## 2019-05-23 DIAGNOSIS — D509 Iron deficiency anemia, unspecified: Secondary | ICD-10-CM | POA: Diagnosis not present

## 2019-05-23 DIAGNOSIS — D631 Anemia in chronic kidney disease: Secondary | ICD-10-CM | POA: Diagnosis not present

## 2019-05-23 DIAGNOSIS — Z992 Dependence on renal dialysis: Secondary | ICD-10-CM | POA: Diagnosis not present

## 2019-05-23 DIAGNOSIS — E1122 Type 2 diabetes mellitus with diabetic chronic kidney disease: Secondary | ICD-10-CM | POA: Diagnosis not present

## 2019-05-23 DIAGNOSIS — N186 End stage renal disease: Secondary | ICD-10-CM | POA: Diagnosis not present

## 2019-05-25 ENCOUNTER — Ambulatory Visit: Payer: Medicare Other | Admitting: Family

## 2019-05-25 DIAGNOSIS — R5381 Other malaise: Secondary | ICD-10-CM | POA: Diagnosis not present

## 2019-05-25 DIAGNOSIS — R1312 Dysphagia, oropharyngeal phase: Secondary | ICD-10-CM | POA: Diagnosis not present

## 2019-05-25 DIAGNOSIS — Z741 Need for assistance with personal care: Secondary | ICD-10-CM | POA: Diagnosis not present

## 2019-05-25 DIAGNOSIS — G9341 Metabolic encephalopathy: Secondary | ICD-10-CM | POA: Diagnosis not present

## 2019-05-25 DIAGNOSIS — N186 End stage renal disease: Secondary | ICD-10-CM | POA: Diagnosis not present

## 2019-05-25 DIAGNOSIS — R41841 Cognitive communication deficit: Secondary | ICD-10-CM | POA: Diagnosis not present

## 2019-05-26 ENCOUNTER — Ambulatory Visit: Payer: Medicare Other | Admitting: Family

## 2019-05-26 DIAGNOSIS — G9341 Metabolic encephalopathy: Secondary | ICD-10-CM | POA: Diagnosis not present

## 2019-05-26 DIAGNOSIS — I10 Essential (primary) hypertension: Secondary | ICD-10-CM | POA: Diagnosis not present

## 2019-05-26 DIAGNOSIS — I5032 Chronic diastolic (congestive) heart failure: Secondary | ICD-10-CM | POA: Diagnosis not present

## 2019-05-26 DIAGNOSIS — R5381 Other malaise: Secondary | ICD-10-CM | POA: Diagnosis not present

## 2019-05-26 DIAGNOSIS — D509 Iron deficiency anemia, unspecified: Secondary | ICD-10-CM | POA: Diagnosis not present

## 2019-05-26 DIAGNOSIS — R1312 Dysphagia, oropharyngeal phase: Secondary | ICD-10-CM | POA: Diagnosis not present

## 2019-05-26 DIAGNOSIS — N186 End stage renal disease: Secondary | ICD-10-CM | POA: Diagnosis not present

## 2019-05-26 DIAGNOSIS — R41841 Cognitive communication deficit: Secondary | ICD-10-CM | POA: Diagnosis not present

## 2019-05-26 DIAGNOSIS — E11649 Type 2 diabetes mellitus with hypoglycemia without coma: Secondary | ICD-10-CM | POA: Diagnosis not present

## 2019-05-26 DIAGNOSIS — D631 Anemia in chronic kidney disease: Secondary | ICD-10-CM | POA: Diagnosis not present

## 2019-05-26 DIAGNOSIS — Z741 Need for assistance with personal care: Secondary | ICD-10-CM | POA: Diagnosis not present

## 2019-05-26 DIAGNOSIS — Z4931 Encounter for adequacy testing for hemodialysis: Secondary | ICD-10-CM | POA: Insufficient documentation

## 2019-05-26 DIAGNOSIS — Z992 Dependence on renal dialysis: Secondary | ICD-10-CM | POA: Diagnosis not present

## 2019-05-26 DIAGNOSIS — I480 Paroxysmal atrial fibrillation: Secondary | ICD-10-CM | POA: Diagnosis not present

## 2019-05-26 DIAGNOSIS — E1122 Type 2 diabetes mellitus with diabetic chronic kidney disease: Secondary | ICD-10-CM | POA: Diagnosis not present

## 2019-05-27 DIAGNOSIS — R5381 Other malaise: Secondary | ICD-10-CM | POA: Diagnosis not present

## 2019-05-27 DIAGNOSIS — Z741 Need for assistance with personal care: Secondary | ICD-10-CM | POA: Diagnosis not present

## 2019-05-27 DIAGNOSIS — R41841 Cognitive communication deficit: Secondary | ICD-10-CM | POA: Diagnosis not present

## 2019-05-27 DIAGNOSIS — N186 End stage renal disease: Secondary | ICD-10-CM | POA: Diagnosis not present

## 2019-05-27 DIAGNOSIS — G9341 Metabolic encephalopathy: Secondary | ICD-10-CM | POA: Diagnosis not present

## 2019-05-27 DIAGNOSIS — R1312 Dysphagia, oropharyngeal phase: Secondary | ICD-10-CM | POA: Diagnosis not present

## 2019-05-28 DIAGNOSIS — E1122 Type 2 diabetes mellitus with diabetic chronic kidney disease: Secondary | ICD-10-CM | POA: Diagnosis not present

## 2019-05-28 DIAGNOSIS — Z992 Dependence on renal dialysis: Secondary | ICD-10-CM | POA: Diagnosis not present

## 2019-05-28 DIAGNOSIS — D509 Iron deficiency anemia, unspecified: Secondary | ICD-10-CM | POA: Diagnosis not present

## 2019-05-28 DIAGNOSIS — D631 Anemia in chronic kidney disease: Secondary | ICD-10-CM | POA: Diagnosis not present

## 2019-05-28 DIAGNOSIS — N186 End stage renal disease: Secondary | ICD-10-CM | POA: Diagnosis not present

## 2019-05-29 DIAGNOSIS — A0472 Enterocolitis due to Clostridium difficile, not specified as recurrent: Secondary | ICD-10-CM | POA: Diagnosis not present

## 2019-05-29 DIAGNOSIS — R7989 Other specified abnormal findings of blood chemistry: Secondary | ICD-10-CM | POA: Diagnosis not present

## 2019-06-01 ENCOUNTER — Telehealth: Payer: Self-pay

## 2019-06-01 ENCOUNTER — Telehealth: Payer: Self-pay | Admitting: Family Medicine

## 2019-06-01 DIAGNOSIS — Z431 Encounter for attention to gastrostomy: Secondary | ICD-10-CM

## 2019-06-01 DIAGNOSIS — N2581 Secondary hyperparathyroidism of renal origin: Secondary | ICD-10-CM | POA: Diagnosis not present

## 2019-06-01 DIAGNOSIS — N186 End stage renal disease: Secondary | ICD-10-CM | POA: Diagnosis not present

## 2019-06-01 DIAGNOSIS — Z992 Dependence on renal dialysis: Secondary | ICD-10-CM | POA: Diagnosis not present

## 2019-06-01 DIAGNOSIS — E1122 Type 2 diabetes mellitus with diabetic chronic kidney disease: Secondary | ICD-10-CM | POA: Diagnosis not present

## 2019-06-01 NOTE — Telephone Encounter (Signed)
The patient's husband sent a MyChart message through his account regarding this patient's feeding tube.  Please call the patient's husband and let them know I can refer her to GI to consider removal.

## 2019-06-01 NOTE — Telephone Encounter (Signed)
Noted  

## 2019-06-01 NOTE — Telephone Encounter (Signed)
Patient was in the hospital last year. Dr. Allen Norris inserted a feeding tube. Patient now wants the feeding tube out. Family called Dr. Ellen Henri office. They reached out to Dr. Allen Norris and Ginger to find out a solution. Please call Dr. Caryl Bis or nurse Gae Bon @ [facility] station to resolve.

## 2019-06-01 NOTE — Telephone Encounter (Signed)
I called the patient's husband and informed  him that I had spoke to someone at Dr. Dorothey Baseman office and they will get in touch with either me or him about how to get the feeding tube removed.  Tiffanie Blassingame,cma

## 2019-06-01 NOTE — Telephone Encounter (Signed)
Can you call pt back and schedule her for an office appt to have this removed? Dr. Allen Norris can take this out here. Let her know he is booked out but we will add her to a wait list and call if a sooner appt becomes available.   Thank you!

## 2019-06-01 NOTE — Telephone Encounter (Signed)
Jamie Arias called from [facility]  saying that they didn't start care over there weekend. They wanted to start Tuesday

## 2019-06-01 NOTE — Telephone Encounter (Signed)
Pt's husband called and wants to know the name of the dr who put the feeding tube in. He states that the pt has not needed it for about 3 weeks and nursing home did not take it out before she went home. Please call him back

## 2019-06-01 NOTE — Telephone Encounter (Signed)
Jamie Arias called from [facility]  saying that they didn't start care over there weekend. They wanted to start Tuesday .  Benjermin Korber,cma

## 2019-06-02 ENCOUNTER — Telehealth: Payer: Self-pay

## 2019-06-02 DIAGNOSIS — Z7901 Long term (current) use of anticoagulants: Secondary | ICD-10-CM | POA: Diagnosis not present

## 2019-06-02 DIAGNOSIS — I132 Hypertensive heart and chronic kidney disease with heart failure and with stage 5 chronic kidney disease, or end stage renal disease: Secondary | ICD-10-CM | POA: Diagnosis not present

## 2019-06-02 DIAGNOSIS — F329 Major depressive disorder, single episode, unspecified: Secondary | ICD-10-CM | POA: Diagnosis not present

## 2019-06-02 DIAGNOSIS — N186 End stage renal disease: Secondary | ICD-10-CM | POA: Diagnosis not present

## 2019-06-02 DIAGNOSIS — E1122 Type 2 diabetes mellitus with diabetic chronic kidney disease: Secondary | ICD-10-CM | POA: Diagnosis not present

## 2019-06-02 DIAGNOSIS — E039 Hypothyroidism, unspecified: Secondary | ICD-10-CM | POA: Diagnosis not present

## 2019-06-02 DIAGNOSIS — B372 Candidiasis of skin and nail: Secondary | ICD-10-CM | POA: Diagnosis not present

## 2019-06-02 DIAGNOSIS — E1140 Type 2 diabetes mellitus with diabetic neuropathy, unspecified: Secondary | ICD-10-CM | POA: Diagnosis not present

## 2019-06-02 DIAGNOSIS — I5032 Chronic diastolic (congestive) heart failure: Secondary | ICD-10-CM | POA: Diagnosis not present

## 2019-06-02 DIAGNOSIS — I4891 Unspecified atrial fibrillation: Secondary | ICD-10-CM | POA: Diagnosis not present

## 2019-06-02 DIAGNOSIS — Z951 Presence of aortocoronary bypass graft: Secondary | ICD-10-CM | POA: Diagnosis not present

## 2019-06-02 DIAGNOSIS — I82622 Acute embolism and thrombosis of deep veins of left upper extremity: Secondary | ICD-10-CM | POA: Diagnosis not present

## 2019-06-02 DIAGNOSIS — D631 Anemia in chronic kidney disease: Secondary | ICD-10-CM | POA: Diagnosis not present

## 2019-06-02 DIAGNOSIS — Z992 Dependence on renal dialysis: Secondary | ICD-10-CM | POA: Diagnosis not present

## 2019-06-02 DIAGNOSIS — R1312 Dysphagia, oropharyngeal phase: Secondary | ICD-10-CM | POA: Diagnosis not present

## 2019-06-02 DIAGNOSIS — E785 Hyperlipidemia, unspecified: Secondary | ICD-10-CM | POA: Diagnosis not present

## 2019-06-02 DIAGNOSIS — I2510 Atherosclerotic heart disease of native coronary artery without angina pectoris: Secondary | ICD-10-CM | POA: Diagnosis not present

## 2019-06-02 NOTE — Telephone Encounter (Signed)
Failed attempt to reach patient for transition of care follow up. No answer. Left message that I will call again tomorrow. Discharged 3/26 (Friday) from [facility].

## 2019-06-03 ENCOUNTER — Telehealth: Payer: Self-pay | Admitting: Family Medicine

## 2019-06-03 DIAGNOSIS — Z992 Dependence on renal dialysis: Secondary | ICD-10-CM | POA: Diagnosis not present

## 2019-06-03 DIAGNOSIS — N186 End stage renal disease: Secondary | ICD-10-CM | POA: Diagnosis not present

## 2019-06-03 NOTE — Telephone Encounter (Signed)
Pt needs a call back about getting feeding tube removed

## 2019-06-03 NOTE — Telephone Encounter (Signed)
Second failed attempt to reach patient for transition of care follow up. No answer. Unable to leave voicemail.

## 2019-06-03 NOTE — Telephone Encounter (Signed)
I called the patient to inform him that Dr. Allen Norris is who he needs to call because he is the one that will take the feeding tube out, I already spoke to them about this.  Ilona Colley,cma

## 2019-06-05 NOTE — Progress Notes (Deleted)
Cardiology Office Note    Date:  06/05/2019   ID:  Jamie Arias, DOB [birth year], MRN [id number]  PCP:  Jamie Haven, MD  Cardiologist:  Jamie Sacramento, MD  Electrophysiologist:  None   Chief Complaint: Preoperative cardiac evaluation  History of Present Illness:   Jamie Arias is a 64 y.o. female with history of CAD status post three-vessel CABG in 06/2013, chronic combined systolic and diastolic CHF, pulmonary pretension, ESRD on HD since 12/2015 (MWF), DM2, anemia of chronic disease, HTN, and HLD who presents for preoperative cardiac risk evaluation.  She was admitted to the hospital with a non-STEMI and 06/2013.  LHC showed severe three-vessel CAD with an EF of 30% and mild to moderate mitral regurgitation.  She underwent three-vessel CABG on 07/02/2013 with LIMA to LAD, SVG to OM, and SVG to RPDA.  Follow-up echo in 07/2013 showed an improved EF of 55 to 60% with no regional wall motion abnormalities, moderate tricuspid regurgitation, trivial pericardial effusion without evidence of tamponade physiology.  Follow-up Myoview in 10/2015 showed no evidence of ischemia or infarct.  Echo from 10/2015 showed an EF of 44%, grade 1 diastolic dysfunction, trivial aortic insufficiency, mild mitral vegetation, mildly dilated left atrium, moderate tricuspid vegetation, and PASP 44 mmHg.  Antihypertensive therapy has previously been discontinued secondary to hypotension during dialysis.  She was last seen in the office in 02/2018, noting issues with orthostatic hypotension which had been somewhat improved with midodrine.  She also noted problems with her balance for which she was being evaluated by neurology.  Echo in 02/2018 showed an EF of 60 to 65%, normal wall motion, grade 1 diastolic dysfunction, mildly dilated left atrium, normal RV systolic function, normal PASP.  She had a prolonged hospital admission from 02/09/2019 through 05/18/4006 with metabolic encephalopathy with acute hypoxic respiratory  failure requiring intubation and PEG tube placement in the setting of sepsis secondary to MRSA bacteremia complicated by possible PAF (noted on review of cardiac telemetry strip), left upper extremity DVT, worsening anemia requiring packed red blood cell transfusion, hypoglycemia, and decubitus ulcers.  She underwent removal of her right AV graft with primary reconstruction of the right brachial artery and VAC placement.  High-sensitivity troponin of 60 with a delta of 62.  2D surface echo on 02/10/2019 showed an EF of 60 to 65%, mild LVH, grade 2 diastolic dysfunction, no regional wall motion normalities, mildly reduced RV systolic function with mildly enlarged RV cavity size, trace mitral regurgitation, moderate to severe tricuspid regurgitation, mild to moderate aortic valve sclerosis without stenosis, mildly elevated PASP at 38 mmHg.  Initially, plans were for patient to undergo TEE though in the setting of her metabolic encephalopathy she was never able to consent for this with recommendation for the primary service to reconsult cardiology for this procedure when the patient was felt suitable.  She was subsequently discharged to skilled nursing facility.  Please see inpatient notes for full details.  She comes in today for preoperative cardiac evaluation for needed dialysis access.  ***   Labs independently reviewed: 04/2019 - Hgb 9.3, PLT 205, potassium 6.8 03/2019 - magnesium 1.7, TSH 17.828, total T4 low at 4.1, albumin 1.8, AST/ALT normal  Past Medical History:  Diagnosis Date  . Anemia   . Chronic diastolic CHF (congestive heart failure) ([place])    a. Due to ischemic cardiomyopathy. EF as low as 35%, improved to normal s/p CABG; b. echo 07/06/13: EF 55-60%, no RWMAs, mod TR, trivial pericardial effusion not c/w  tamponade physiology;  c. 10/2015 Echo: EF 65%, Gr1 DD, triv AI, mild MR, mildly dil LA, mod TR, PASP 26mmHg.  Jamie Arias Coronary artery disease    a. NSTEMI 06/2013; b.cath: severe three-vessel CAD  w/ EF 30% & mild-mod MR; c. s/p 3 vessel CABG 07/02/13 (LIMA-LAD, SVG-OM, and SVG-RPDA);  d. 10/2015 MV: no ischemia/infarct.  . Diabetes mellitus without complication (Jamie Arias)   . Diabetic neuropathy ([place])   . Dialysis patient [facility])    MWF  . ESRD (end stage renal disease) ([place])    a. 12/2015 initiated - mwf dialysis.  Jamie Arias GERD (gastroesophageal reflux disease)   . Hyperlipidemia   . Hypertension   . Hypothyroidism   . Myocardial infarction (Jamie Arias) 2015  . Neuropathy   . Pleural effusion 2015  . Pulmonary hypertension ([place])   . Renal insufficiency   . Wears dentures    full lower    Past Surgical History:  Procedure Laterality Date  . A/V FISTULAGRAM Right 12/17/2016   Procedure: A/V Fistulagram;  Surgeon: Algernon Huxley, MD;  Location: [place] [facility];  Service: Cardiovascular;  Laterality: Right;  . A/V FISTULAGRAM Right 01/07/2017   Procedure: A/V Fistulagram;  Surgeon: Algernon Huxley, MD;  Location: [place] [facility];  Service: Cardiovascular;  Laterality: Right;  . A/V FISTULAGRAM Right 12/03/2017   Procedure: A/V FISTULAGRAM;  Surgeon: Katha Cabal, MD;  Location: [place] [facility];  Service: Cardiovascular;  Laterality: Right;  . A/V FISTULAGRAM Right 02/23/2019   Procedure: A/V Fistulagram;  Surgeon: Algernon Huxley, MD;  Location: [place] [facility];  Service: Cardiovascular;  Laterality: Right;  . A/V SHUNT INTERVENTION N/A 02/22/2017   Procedure: A/V SHUNT INTERVENTION;  Surgeon: Algernon Huxley, MD;  Location: [place] [facility];  Service: Cardiovascular;  Laterality: N/A;  . AV FISTULA PLACEMENT Right 02/03/2016   Procedure: INSERTION OF ARTERIOVENOUS (AV) GORE-TEX GRAFT ARM ( BRACH / AXILLARY );  Surgeon: Katha Cabal, MD;  Location: [facility];  Service: Vascular;  Laterality: Right;  . Steen REMOVAL Right 03/04/2019   Procedure: REMOVAL OF ARTERIOVENOUS GORETEX GRAFT ([place]);  Surgeon: Algernon Huxley, MD;  Location: [facility];  Service: Vascular;  Laterality: Right;    . CARDIAC CATHETERIZATION    . CATARACT EXTRACTION Bilateral   . CHOLECYSTECTOMY N/A 12/09/2014   Procedure: LAPAROSCOPIC CHOLECYSTECTOMY;  Surgeon: Marlyce [name], MD;  Location: [facility];  Service: General;  Laterality: N/A;  . CORONARY ARTERY BYPASS GRAFT N/A 07/02/2013   Procedure: CORONARY ARTERY BYPASS GRAFTING (CABG);  Surgeon: Ivin [name], MD;  Location: [place];  Service: Open Heart Surgery;  Laterality: N/A;  CABG x three, using left internal mammary artery and right leg greater saphenous vein harvested endoscopically  . ESOPHAGOGASTRODUODENOSCOPY N/A 04/11/2019   Procedure: ESOPHAGOGASTRODUODENOSCOPY (EGD);  Surgeon: Jonathon Bellows, MD;  [facility];  Service: Gastroenterology;  Laterality: N/A;  . ESOPHAGOGASTRODUODENOSCOPY (EGD) WITH PROPOFOL N/A 11/24/2015   Procedure: ESOPHAGOGASTRODUODENOSCOPY (EGD) WITH PROPOFOL;  Surgeon: [name], MD;  Location: [place];  Service: Endoscopy;  Laterality: N/A;  Diabetic - insulin  . EYE SURGERY Bilateral    Cataract Extraction with IOL  . INTRAOPERATIVE TRANSESOPHAGEAL ECHOCARDIOGRAM N/A 07/02/2013   Procedure: INTRAOPERATIVE TRANSESOPHAGEAL ECHOCARDIOGRAM;  Surgeon: Ivin [name], MD;  Location: [place];  Service: Open Heart Surgery;  Laterality: N/A;  . PEG PLACEMENT N/A 03/03/2019   Procedure: PERCUTANEOUS ENDOSCOPIC GASTROSTOMY (PEG) PLACEMENT;  Surgeon: Virgel Manifold, MD;  Location: [facility] ENDOSCOPY;  Service: Endoscopy;  Laterality: N/A;  . PERIPHERAL VASCULAR CATHETERIZATION Right 12/06/2015   Procedure: Dialysis/Perma Catheter Insertion;  Surgeon: Katha Cabal, MD;  Location: [place] [facility];  Service: Cardiovascular;  Laterality: Right;  . PERIPHERAL VASCULAR THROMBECTOMY Right 09/28/2016   Procedure: Peripheral Vascular Thrombectomy;  Surgeon: Katha Cabal, MD;  Location: [name] [facility];  Service: Cardiovascular;  Laterality: Right;  . PERIPHERAL VASCULAR THROMBECTOMY Right  05/20/2018   Procedure: PERIPHERAL VASCULAR THROMBECTOMY;  Surgeon: Katha Cabal, MD;  Location: [place] [facility];  Service: Cardiovascular;  Laterality: Right;  . PORTA CATH REMOVAL N/A 06/01/2016   Procedure: Glori [name] Cath Removal;  Surgeon: Katha Cabal, MD;  Location: [place] [facility];  Service: Cardiovascular;  Laterality: N/A;  . THORACENTESIS Left 2015    Current Medications: No outpatient medications have been marked as taking for the 06/09/19 encounter (Appointment) with Rise Mu, PA-C.    Allergies:   Nsaids and Doxycycline   Social History   Socioeconomic History  . Marital status: Married    Spouse name: Not on file  . Number of children: Not on file  . Years of education: Not on file  . Highest education level: Not on file  Occupational History  . Occupation: works for Building [employer]  Tobacco Use  . Smoking status: Never Smoker  . Smokeless tobacco: Never Used  Substance and Sexual Activity  . Alcohol use: No    Alcohol/week: 0.0 standard drinks  . Drug use: No  . Sexual activity: Not on file  Other Topics Concern  . Not on file  Social History Narrative   Patient lives at home with her husband. Patient has 4 adult children.   Social Determinants of Health   Financial Resource Strain:   . Difficulty of Paying Living Expenses:   Food Insecurity:   . Worried About Charity fundraiser in the Last Year:   . Arboriculturist in the Last Year:   Transportation Needs:   . Film/video editor (Medical):   Jamie Arias Lack of Transportation (Non-Medical):   Physical Activity:   . Days of Exercise per Week:   . Minutes of Exercise per Session:   Stress:   . Feeling of Stress :   Social Connections:   . Frequency of Communication with Friends and Family:   . Frequency of Social Gatherings with Friends and Family:   . Attends Religious Services:   . Active Member of Clubs or Organizations:   . Attends Archivist Meetings:   Jamie Arias Marital  Status:      Family History:  The patient's family history includes COPD in her mother; Cancer in her mother; Diabetes in her father and paternal grandfather; Heart disease in her maternal grandmother; Pulmonary embolism in her father. There is no history of Colon cancer, Colon polyps, Esophageal cancer, Pancreatic cancer, or Liver disease.  ROS:   ROS   EKGs/Labs/Other Studies Reviewed:    Studies reviewed were summarized above. The additional studies were reviewed today: ***  EKG:  EKG is ordered today.  The EKG ordered today demonstrates ***  Recent Labs: 03/01/2019: B Natriuretic Peptide 720.0 03/11/2019: ALT 7 03/15/2019: TSH 17.828 03/18/2019: Magnesium 1.7 04/20/2019: BUN 46; Creatinine, Ser 3.54; Hemoglobin 9.3; Platelets 205; Potassium 6.8; Sodium 130  Recent Lipid Panel    Component Value Date/Time   CHOL 153 02/11/2018 1404   TRIG 294 (H) 02/11/2018 1404   HDL 43 02/11/2018 1404   CHOLHDL 3.6 02/11/2018 1404  CHOLHDL 3.4 11/07/2017 0950   VLDL 22 11/07/2017 0950   LDLCALC 51 02/11/2018 1404   LDLDIRECT 60 02/11/2018 1404   LDLDIRECT 126.9 10/31/2012 0801    PHYSICAL EXAM:    VS:  There were no vitals taken for this visit.  BMI: There is no height or weight on file to calculate BMI.  Physical Exam  Wt Readings from Last 3 Encounters:  04/20/19 194 lb 7.1 oz (88.2 kg)  09/13/18 193 lb 5.5 oz (87.7 kg)  08/23/18 210 lb (95.3 kg)     ASSESSMENT & PLAN:   1. ***  Disposition: F/u with Dr. Fletcher Anon or an APP in ***.   Medication Adjustments/Labs and Tests Ordered: Current medicines are reviewed at length with the patient today.  Concerns regarding medicines are outlined above. Medication changes, Labs and Tests ordered today are summarized above and listed in the Patient Instructions accessible in Encounters.   Signed, Christell Faith, PA-C 06/05/2019 2:30 PM     [address], [place] [address] [phone]

## 2019-06-09 ENCOUNTER — Ambulatory Visit: Payer: Medicare Other | Admitting: Physician Assistant

## 2019-06-11 ENCOUNTER — Telehealth: Payer: Self-pay

## 2019-06-11 NOTE — Telephone Encounter (Signed)
Noted. Agree with need for evaluation. Please follow-up with them to make sure the patient was evaluated.

## 2019-06-11 NOTE — Telephone Encounter (Signed)
Noted  

## 2019-06-11 NOTE — Telephone Encounter (Signed)
Patient stated that her o2 sat was staying 91-92% her PT told her to keep a check on it. She did not go to ED she didn't want to, but if o2 gets worse she stated she will go.

## 2019-06-11 NOTE — Telephone Encounter (Signed)
Patient at home physical therapist called and stated that 02 sat was very low @ 84% for the lowest, she currently has pulse ox on finger monitoring,the highest went up to 96 for one sec then dropped down to mid 80's. PT stated that o2 sat has never been that low and no other sx are occurring. Due to oxygen saturation being that low I recommend patient going to ED. PT stated she will let patient know. She will stay with patient until transport and help facilitate patient going to ED.

## 2019-06-12 ENCOUNTER — Other Ambulatory Visit: Payer: Self-pay

## 2019-06-12 NOTE — Telephone Encounter (Signed)
Patient is scheduled with Dr. Allen Norris for 08/04/19. Thanks

## 2019-06-16 ENCOUNTER — Ambulatory Visit: Payer: Medicare Other | Admitting: Physician Assistant

## 2019-06-17 DIAGNOSIS — I10 Essential (primary) hypertension: Secondary | ICD-10-CM | POA: Diagnosis not present

## 2019-06-17 DIAGNOSIS — R0902 Hypoxemia: Secondary | ICD-10-CM | POA: Diagnosis not present

## 2019-06-17 DIAGNOSIS — Z7401 Bed confinement status: Secondary | ICD-10-CM | POA: Diagnosis not present

## 2019-06-17 DIAGNOSIS — M2550 Pain in unspecified joint: Secondary | ICD-10-CM | POA: Diagnosis not present

## 2019-06-23 ENCOUNTER — Ambulatory Visit (INDEPENDENT_AMBULATORY_CARE_PROVIDER_SITE_OTHER): Payer: Medicare Other | Admitting: Physician Assistant

## 2019-06-23 ENCOUNTER — Other Ambulatory Visit: Payer: Self-pay

## 2019-06-23 ENCOUNTER — Encounter: Payer: Self-pay | Admitting: Physician Assistant

## 2019-06-23 VITALS — BP 166/60 | HR 64 | Ht 64.0 in | Wt 178.0 lb

## 2019-06-23 DIAGNOSIS — I2510 Atherosclerotic heart disease of native coronary artery without angina pectoris: Secondary | ICD-10-CM

## 2019-06-23 DIAGNOSIS — I5032 Chronic diastolic (congestive) heart failure: Secondary | ICD-10-CM

## 2019-06-23 DIAGNOSIS — I959 Hypotension, unspecified: Secondary | ICD-10-CM

## 2019-06-23 DIAGNOSIS — N186 End stage renal disease: Secondary | ICD-10-CM

## 2019-06-23 DIAGNOSIS — Z79899 Other long term (current) drug therapy: Secondary | ICD-10-CM

## 2019-06-23 DIAGNOSIS — Z01810 Encounter for preprocedural cardiovascular examination: Secondary | ICD-10-CM

## 2019-06-23 DIAGNOSIS — I951 Orthostatic hypotension: Secondary | ICD-10-CM

## 2019-06-23 DIAGNOSIS — E785 Hyperlipidemia, unspecified: Secondary | ICD-10-CM

## 2019-06-23 MED ORDER — HYDRALAZINE HCL 10 MG PO TABS: 10.0000 mg | ORAL_TABLET | Freq: Two times a day (BID) | ORAL | 0 refills | Status: DC

## 2019-06-23 NOTE — Patient Instructions (Signed)
Medication Instructions:   1. Your physician has recommended you make the following change in your medication:   - START Hydralazine 10MG - Take one tablet TWICE a day  *If you need a refill on your cardiac medications before your next appointment, please call your pharmacy*   Lab Work:  1. Your physician recommends that you have lab work today(BMET CBC)  If you have labs (blood work) drawn today and your tests are completely normal, you will receive your results only by: Marland Kitchen MyChart Message (if you have MyChart) OR . A paper copy in the mail If you have any lab test that is abnormal or we need to change your treatment, we will call you to review the results.   Testing/Procedures:  None Ordered  Follow-Up: At [facility], you and your health needs are our priority.  As part of our continuing mission to provide you with exceptional heart care, we have created designated Provider Care Teams.  These Care Teams include your primary Cardiologist (physician) and Advanced Practice Providers (APPs -  Physician Assistants and Nurse Practitioners) who all work together to provide you with the care you need, when you need it.  We recommend signing up for the patient portal called "MyChart".  Sign up information is provided on this After Visit Summary.  MyChart is used to connect with patients for Virtual Visits (Telemedicine).  Patients are able to view lab/test results, encounter notes, upcoming appointments, etc.  Non-urgent messages can be sent to your provider as well.   To learn more about what you can do with MyChart, go to [email or web address].    Your next appointment:   1 month(s)  The format for your next appointment:   In Person  Provider:    You may see Kathlyn Sacramento, MD or one of the following Advanced Practice Providers on your designated Care Team:    Murray Hodgkins, NP  Christell Faith, PA-C  Marrianne Mood, PA-C    Other Instructions  1. Please contact  nephrology to see if you need to hold Hydralazine for dialysis.

## 2019-06-23 NOTE — Progress Notes (Signed)
Office Visit    Patient Name: Jamie Arias Date of Encounter: 06/23/2019  Primary Care Provider:  [name], MD Primary Cardiologist:  Kathlyn Sacramento, MD  Chief Complaint    Chief Complaint  Patient presents with  . Other    6 month follow up. Needs a graft in left arm. Meds reviewed verbally with patient.     64 year old female with history of CAD s/p CABG, chronic diastolic heart failure, DM2, end-stage renal disease on hemodialysis, anemia, hypertension, and hyperlipidemia.  Past Medical History    Past Medical History:  Diagnosis Date  . Anemia   . Chronic diastolic CHF (congestive heart failure) ([place])    a. Due to ischemic cardiomyopathy. EF as low as 35%, improved to normal s/p CABG; b. echo 07/06/13: EF 55-60%, no RWMAs, mod TR, trivial pericardial effusion not c/w tamponade physiology;  c. 10/2015 Echo: EF 65%, Gr1 DD, triv AI, mild MR, mildly dil LA, mod TR, PASP 50mmHg.  Marland Kitchen Coronary artery disease    a. NSTEMI 06/2013; b.cath: severe three-vessel CAD w/ EF 30% & mild-mod MR; c. s/p 3 vessel CABG 07/02/13 (LIMA-LAD, SVG-OM, and SVG-RPDA);  d. 10/2015 MV: no ischemia/infarct.  . Diabetes mellitus without complication ([place])   . Diabetic neuropathy ([place])   . Dialysis patient [facility])    MWF  . ESRD (end stage renal disease) ([place])    a. 12/2015 initiated - mwf dialysis.  Marland Kitchen GERD (gastroesophageal reflux disease)   . Hyperlipidemia   . Hypertension   . Hypothyroidism   . Myocardial infarction ([place]) 2015  . Neuropathy   . Pleural effusion 2015  . Pulmonary hypertension ([place])   . Renal insufficiency   . Wears dentures    full lower   Past Surgical History:  Procedure Laterality Date  . A/V FISTULAGRAM Right 12/17/2016   Procedure: A/V Fistulagram;  Surgeon: Algernon Huxley, MD;  Location: [place] [facility];  Service: Cardiovascular;  Laterality: Right;  . A/V FISTULAGRAM Right 01/07/2017   Procedure: A/V Fistulagram;  Surgeon: Algernon Huxley, MD;  Location: [place] [facility];  Service: Cardiovascular;  Laterality: Right;  . A/V FISTULAGRAM Right 12/03/2017   Procedure: A/V FISTULAGRAM;  Surgeon: Katha Cabal, MD;  Location: [place] [facility];  Service: Cardiovascular;  Laterality: Right;  . A/V FISTULAGRAM Right 02/23/2019   Procedure: A/V Fistulagram;  Surgeon: Algernon Huxley, MD;  Location: [place] [facility];  Service: Cardiovascular;  Laterality: Right;  . A/V SHUNT INTERVENTION N/A 02/22/2017   Procedure: A/V SHUNT INTERVENTION;  Surgeon: Algernon Huxley, MD;  Location: [place] [facility];  Service: Cardiovascular;  Laterality: N/A;  . AV FISTULA PLACEMENT Right 02/03/2016   Procedure: INSERTION OF ARTERIOVENOUS (AV) GORE-TEX GRAFT ARM ( BRACH / AXILLARY );  Surgeon: Katha Cabal, MD;  Location: [facility];  Service: Vascular;  Laterality: Right;  . [place] REMOVAL Right 03/04/2019   Procedure: REMOVAL OF ARTERIOVENOUS GORETEX GRAFT ([place]);  Surgeon: Algernon Huxley, MD;  Location: [facility];  Service: Vascular;  Laterality: Right;  . CARDIAC CATHETERIZATION    . CATARACT EXTRACTION Bilateral   . CHOLECYSTECTOMY N/A 12/09/2014   Procedure: LAPAROSCOPIC CHOLECYSTECTOMY;  Surgeon: Marlyce [name], MD;  Location: [facility];  Service: General;  Laterality: N/A;  . CORONARY ARTERY BYPASS GRAFT N/A 07/02/2013   Procedure: CORONARY ARTERY BYPASS GRAFTING (CABG);  Surgeon: Ivin [name], MD;  Location: [place];  Service: Open Heart Surgery;  Laterality: N/A;  CABG x  three, using left internal mammary artery and right leg greater saphenous vein harvested endoscopically  . ESOPHAGOGASTRODUODENOSCOPY N/A 04/11/2019   Procedure: ESOPHAGOGASTRODUODENOSCOPY (EGD);  Surgeon: Jonathon Bellows, MD;  Location: [facility];  Service: Gastroenterology;  Laterality: N/A;  . ESOPHAGOGASTRODUODENOSCOPY (EGD) WITH PROPOFOL N/A 11/24/2015   Procedure: ESOPHAGOGASTRODUODENOSCOPY (EGD) WITH PROPOFOL;  Surgeon: [name], MD;  Location: [place];  Service:  Endoscopy;  Laterality: N/A;  Diabetic - insulin  . EYE SURGERY Bilateral    Cataract Extraction with IOL  . INTRAOPERATIVE TRANSESOPHAGEAL ECHOCARDIOGRAM N/A 07/02/2013   Procedure: INTRAOPERATIVE TRANSESOPHAGEAL ECHOCARDIOGRAM;  Surgeon: Ivin [name], MD;  Location: [place];  Service: Open Heart Surgery;  Laterality: N/A;  . PEG PLACEMENT N/A 03/03/2019   Procedure: PERCUTANEOUS ENDOSCOPIC GASTROSTOMY (PEG) PLACEMENT;  Surgeon: Virgel Manifold, MD;  Location: [facility];  Service: Endoscopy;  Laterality: N/A;  . PERIPHERAL VASCULAR CATHETERIZATION Right 12/06/2015   Procedure: Dialysis/Perma Catheter Insertion;  Surgeon: Katha Cabal, MD;  Location: [place] [facility];  Service: Cardiovascular;  Laterality: Right;  . PERIPHERAL VASCULAR THROMBECTOMY Right 09/28/2016   Procedure: Peripheral Vascular Thrombectomy;  Surgeon: Katha Cabal, MD;  Location: [place] [facility];  Service: Cardiovascular;  Laterality: Right;  . PERIPHERAL VASCULAR THROMBECTOMY Right 05/20/2018   Procedure: PERIPHERAL VASCULAR THROMBECTOMY;  Surgeon: Katha Cabal, MD;  Location: [place] [facility];  Service: Cardiovascular;  Laterality: Right;  . PORTA CATH REMOVAL N/A 06/01/2016   Procedure: Glori [name] Cath Removal;  Surgeon: Katha Cabal, MD;  Location: [place] [facility];  Service: Cardiovascular;  Laterality: N/A;  . THORACENTESIS Left 2015    Allergies  Allergies  Allergen Reactions  . Nsaids Other (See Comments)    Contraindicated due to kidney disease.  Marland Kitchen Doxycycline Other (See Comments)    tremor    History of Present Illness    Jamie Arias is a 64 y.o. female with PMH as above.  She is s/p CABG in April 2015.  She was started on dialysis 12/2015.  Dialysis days are Monday Wednesday and Fridays.  Antihypertensive medications were discontinued due to hypotension during dialysis.  EF previously reduced and recovered on most recent 10/2015 echo.  She has a history of mild MR  and mild pulmonary hypertension.  She has a history of orthostatic issues, managed with midodrine with partial improvement.  She is seeing Dr. Brigitte Pulse neurology for balance problems and uses a wheelchair most of the time.  She has a history of severe hyperlipidemia and did not respond well to Zetia and started on Praluent.  Since last seen in the office, she has been in the hospital several times. She was seen 09/2018 due to COVID-19 and reports that she has not been feeling well since that time. She states that this is the time in which she started to become dependent on her wheelchair. She reports that, since that time, she is too weak to ambulate. She was seen in the hospital at in early 2021 for possible TEE 2/2 bacteremia, which was deferred 2/2 patient lethargy and inability to respond. She has noted dizziness with rolling / transfer within the bed in the past but not recently. No racing HR or palpitations. No CP or SOB at rest. No s/sx consistent with heart failure including dyspnea, pnd, orthopnea, n, v, dizziness, syncope, edema, weight gain, or early satiety. She expresses frustration that she still has her feeding tube in place at this time and with estimated date of removal in two months  at the earliest. She expresses worry that it will become infected. She reports medication compliance. No s/sx of bleeding.   Home Medications    Prior to Admission medications   Medication Sig Start Date End Date Taking? Authorizing Provider  acetaminophen (TYLENOL) 325 MG tablet Take 650 mg by mouth daily as needed for moderate pain or headache.    Yes [provider]  Amino Acids-Protein Hydrolys (FEEDING SUPPLEMENT, PRO-STAT SUGAR FREE 64,) LIQD Place 30 mLs into feeding tube daily. 04/21/19  Yes Sreenath, Sudheer B, MD  aspirin EC 81 MG tablet Take 81 mg by mouth daily.   Yes [provider]  atorvastatin (LIPITOR) 40 MG tablet Take 40 mg by mouth daily.   Yes [provider]   ELIQUIS 5 MG TABS tablet Take 5 mg by mouth 2 (two) times daily.  01/20/19  Yes [provider]  ferrous gluconate (FERGON) 324 MG tablet Take 1 tablet (324 mg total) by mouth daily. 04/21/19  Yes Sreenath, Sudheer B, MD  FLUoxetine (PROZAC) 20 MG capsule Take 20 mg by mouth daily.   Yes [provider]  fluticasone (FLONASE) 50 MCG/ACT nasal spray Place 2 sprays into both nostrils daily. Patient taking differently: Place 2 sprays into both nostrils daily as needed for allergies or rhinitis.  09/17/17  Yes [name], MD  folic acid (FOLVITE) 1 MG tablet Take 1 tablet (1 mg total) by mouth daily. 04/21/19  Yes Sreenath, Sudheer B, MD  gabapentin (NEURONTIN) 100 MG capsule Take 100 mg by mouth 3 (three) times daily.   Yes [provider]  hydrocortisone cream 1 % Apply topically as needed for itching. 04/20/19  Yes Sreenath, Sudheer B, MD  ipratropium-albuterol (DUONEB) 0.5-2.5 (3) MG/3ML SOLN Take 3 mLs by nebulization every 4 (four) hours as needed. 04/20/19  Yes Sreenath, Sudheer B, MD  levothyroxine (SYNTHROID) 88 MCG tablet Take 88 mcg by mouth daily before breakfast.   Yes [provider]  Melatonin 3 MG TABS Take by mouth at bedtime.   Yes [provider]  multivitamin (RENA-VIT) TABS tablet Take 1 tablet by mouth at bedtime. 09/09/18  Yes Hosie Poisson, MD  nitroGLYCERIN (NITROSTAT) 0.4 MG SL tablet DISSOLVE ONE TABLET UNDER THE TONGUE EVERY 5 MINUTES AS NEEDED FOR CHEST PAIN.  DO NOT EXCEED A TOTAL OF 3 DOSES IN 15 MINUTES Patient taking differently: Place 0.4 mg under the tongue every 5 (five) minutes as needed for chest pain. DISSOLVE ONE TABLET UNDER THE TONGUE EVERY 5 MINUTES AS NEEDED FOR CHEST PAIN.  DO NOT EXCEED A TOTAL OF 3 DOSES IN 15 MINUTES 08/18/18  Yes Wellington Hampshire, MD  Nutritional Supplements (FEEDING SUPPLEMENT, OSMOLITE 1.5 CAL,) LIQD Place 1,000 mLs into feeding tube continuous. 04/20/19  Yes Sreenath, Sudheer B, MD  nystatin  (MYCOSTATIN/NYSTOP) powder Apply 1 application topically 2 (two) times daily.   Yes [provider]  ondansetron (ZOFRAN-ODT) 4 MG disintegrating tablet Take 1 tablet (4 mg total) by mouth every 8 (eight) hours as needed for nausea or vomiting. appt with PCP further refills Dr. Caryl Bis 01/28/19  Yes McLean-Scocuzza, Nino Glow, MD  pantoprazole sodium (PROTONIX) 40 mg/20 mL PACK Place 20 mLs (40 mg total) into feeding tube 2 (two) times daily. 04/20/19  Yes Sidney Ace, MD  SANTYL ointment  04/23/19  Yes [provider]  senna-docusate (SENOKOT-S) 8.6-50 MG tablet Take 1 tablet by mouth at bedtime. 04/20/19  Yes Sreenath, Sudheer B, MD  sodium zirconium cyclosilicate (LOKELMA) 10  g PACK packet Take 10 g by mouth daily. 04/20/19  Yes Sreenath, Sudheer B, MD  vitamin C (VITAMIN C) 500 MG tablet Take 1 tablet (500 mg total) by mouth daily. 09/10/18  Yes Hosie Poisson, MD  Water For Irrigation, Sterile (FREE WATER) SOLN Place 20 mLs into feeding tube every 4 (four) hours. 04/20/19  Yes Sreenath, Sudheer B, MD  zinc sulfate 220 (50 Zn) MG capsule Take 1 capsule (220 mg total) by mouth daily. 09/10/18  Yes Hosie Poisson, MD    Review of Systems    She denies chest pain, palpitations, dyspnea, pnd, orthopnea, n, v, dizziness, syncope, edema, weight gain, or early satiety. She reports weakness and inability to ambulate since COVID-19.   All other systems reviewed and are otherwise negative except as noted above.  Physical Exam    VS:  BP (!) 166/60 (BP Location: Right Arm, Patient Position: Sitting, Cuff Size: Normal)   Pulse 64   Ht 5\' 4"  (1.626 m)   Wt 178 lb (80.7 kg)   SpO2 96%   BMI 30.55 kg/m  , BMI Body mass index is 30.55 kg/m. GEN: Well nourished, well developed, in no acute distress. HEENT: normal. Neck: Supple, no JVD, carotid bruits, or masses. Cardiac: RRR with frequent extrasystole, 1/6 systolic murmurs, rubs, or gallops. No clubbing, cyanosis, edema.  Radials/DP/PT  2+ and equal bilaterally.  Respiratory:  Respirations regular and unlabored, clear to auscultation bilaterally. GI: Soft, nontender, nondistended, BS + x 4. MS: no deformity or atrophy. Skin: warm and dry, no rash. Neuro:  Strength and sensation are intact. Psych: Normal affect.  Accessory Clinical Findings    ECG personally reviewed by me today - Sinus with PACs, 68pm, LAD, poor R wave progression in septal and anterior leads - no acute changes.  VITALS Reviewed today   Temp Readings from Last 3 Encounters:  04/20/19 98.9 F (37.2 C) (Oral)  09/15/18 97.8 F (36.6 C) (Oral)  08/23/18 (!) 100.8 F (38.2 C)   BP Readings from Last 3 Encounters:  06/23/19 (!) 166/60  05/18/19 (!) 189/81  05/04/19 (!) 124/54   Pulse Readings from Last 3 Encounters:  06/23/19 64  05/18/19 65  05/04/19 61    Wt Readings from Last 3 Encounters:  06/23/19 178 lb (80.7 kg)  04/20/19 194 lb 7.1 oz (88.2 kg)  09/13/18 193 lb 5.5 oz (87.7 kg)     LABS  reviewed today   CareEverwhere Labs present and most recent? Yes/No: No  Lab Results  Component Value Date   WBC 6.1 04/20/2019   HGB 9.3 (L) 04/20/2019   HCT 29.2 (L) 04/20/2019   MCV 97.3 04/20/2019   PLT 205 04/20/2019   Lab Results  Component Value Date   CREATININE 3.54 (H) 04/20/2019   BUN 46 (H) 04/20/2019   NA 130 (L) 04/20/2019   K 6.8 (HH) 04/20/2019   CL 93 (L) 04/20/2019   CO2 30 04/20/2019   Lab Results  Component Value Date   ALT 7 03/11/2019   AST 17 03/11/2019   ALKPHOS 74 03/11/2019   BILITOT 0.7 03/11/2019   Lab Results  Component Value Date   CHOL 153 02/11/2018   HDL 43 02/11/2018   LDLCALC 51 02/11/2018   LDLDIRECT 60 02/11/2018   TRIG 294 (H) 02/11/2018   CHOLHDL 3.6 02/11/2018    Lab Results  Component Value Date   HGBA1C 5.3 02/09/2019   Lab Results  Component Value Date   TSH 17.828 (H) 03/15/2019  STUDIES/PROCEDURES reviewed today   Echo 02/10/2019 1. Left ventricular ejection  fraction, by visual estimation, is 60 to  65%. The left ventricle has normal function. There is mildly increased  left ventricular hypertrophy.  2. Elevated left atrial pressure.  3. Left ventricular diastolic parameters are consistent with Grade II  diastolic dysfunction (pseudonormalization).  4. The left ventricle has no regional wall motion abnormalities.  5. Global right ventricle has mildly reduced systolic function.The right  ventricular size is mildly enlarged. Mildly increased right ventricular  wall thickness.  6. Left atrial size was normal.  7. Right atrial size was normal.  8. Mild mitral annular calcification.  9. The mitral valve is normal in structure. Trace mitral valve  regurgitation. No evidence of mitral stenosis.  10. The tricuspid valve is normal in structure. Tricuspid valve  regurgitation moderate-severe.  11. The aortic valve is tricuspid. Aortic valve regurgitation is trivial.  Mild to moderate aortic valve sclerosis/calcification without any evidence  of aortic stenosis.  12. The pulmonic valve was grossly normal. Pulmonic valve regurgitation is  trivial.  13. Mildly elevated pulmonary artery systolic pressure.  14. The inferior vena cava is normal in size with <50% respiratory  variability, suggesting right atrial pressure of 8 mmHg.   Assessment & Plan    Cardiac evaluation for [facility] access placement --Discussed case with primary cardiologist and primary rounding cardiology team at time of admission, and it is not necessary to obtain a TEE at this time before proceeding with right sided access placement.  --She is euvolemic and well compensated on exam. Clinical risk factors include ischemic heart dz, congestive heart failure, and ESRD. Functional capacity noted to be reduced since COVID-19. --Given no active cardiac conditions at this time, it is reasonable to proceed with access placement without additional testing or intervention. Recent EKG and  echo as above.    --Recommend discontinuing apixaban 2 days prior to surgery.  Reinitiation of apixaban should be done as soon as it is felt safe to do so after surgery. Regarding ASA therapy, we recommend continuation of ASA throughout the perioperative period.  However, if the surgeon feels that cessation of ASA is required in the perioperative period, it may be stopped 5-7 days prior to surgery with a plan to resume it as soon as felt to be feasible from a surgical standpoint in the post-operative period. --Of note, we have yet to receive formal request for cardiac evaluation and this should be sent to our office before the procedure if possible. We did call the office today with this request.   Feeding tube --Will reach out to GI to ascertain if removal can be expedited, given on review of EMR, patient has been requesting removal for some time. Husband has also expressed concern that the patient may remove the tube on her own soon. Patient is concerned that the tube may be infected soon, as she is uncertain how to clean it an noted that it was cleaned daily while in the hospital.    History of CAD --S/p prior bypass with subsequent negative stress testing in 10/2015. No CP or concerning sx for angina. Continue current medications.   Chronic diastolic heart failure --Denies any worsening s/sx of heart failure. Euvolemic. Due to elevated BP, start hydralazine 10mg  BID. Volume management per nephrology.  Hypotension / orthostatic hypotension with dialysis --Reached out to nephrology to inform that we will be starting on hydralazine 10mg  BID, given her history of hypotension with HD. She is aware that she  should hold her hydralazine on HD days unless instructed otherwise before that time.  HLD --Continue current statin and Praulent.   ESRD on HD --M, W, F. Reached out to [facility] nephrology team to inform them today that we will be starting the patient on hydralazine 10mg  BID. Per nephrology, she will  follow-up with them tomorrow.   Medication changes: Hydralazine 10mg  BID Labs ordered: BMET, CBC Studies / Imaging ordered: None Future considerations: TEE no longer indicated per primary cardiologist  Disposition: RTC in 3 months  *See above for recommendations regarding ASA and Eliquis  Arvil Chaco, PA-C 06/23/2019

## 2019-06-24 ENCOUNTER — Telehealth: Payer: Self-pay | Admitting: Physician Assistant

## 2019-06-24 ENCOUNTER — Telehealth (INDEPENDENT_AMBULATORY_CARE_PROVIDER_SITE_OTHER): Payer: Self-pay

## 2019-06-24 LAB — BASIC METABOLIC PANEL
BUN/Creatinine Ratio: 5 — ABNORMAL LOW (ref 12–28)
BUN: 21 mg/dL (ref 8–27)
CO2: 28 mmol/L (ref 20–29)
Calcium: 8.4 mg/dL — ABNORMAL LOW (ref 8.7–10.3)
Chloride: 97 mmol/L (ref 96–106)
Creatinine, Ser: 4.3 mg/dL — ABNORMAL HIGH (ref 0.57–1.00)
GFR calc Af Amer: 12 mL/min/{1.73_m2} — ABNORMAL LOW (ref >59)
GFR calc non Af Amer: 10 mL/min/{1.73_m2} — ABNORMAL LOW (ref >59)
Glucose: 185 mg/dL — ABNORMAL HIGH (ref 65–99)
Potassium: 4.9 mmol/L (ref 3.5–5.2)
Sodium: 138 mmol/L (ref 134–144)

## 2019-06-24 LAB — CBC
Hematocrit: 35.9 % (ref 34.0–46.6)
Hemoglobin: 10.8 g/dL — ABNORMAL LOW (ref 11.1–15.9)
MCH: 28.4 pg (ref 26.6–33.0)
MCHC: 30.1 g/dL — ABNORMAL LOW (ref 31.5–35.7)
MCV: 95 fL (ref 79–97)
Platelets: 194 10*3/uL (ref 150–450)
RBC: 3.8 x10E6/uL (ref 3.77–5.28)
RDW: 16.3 % — ABNORMAL HIGH (ref 11.7–15.4)
WBC: 5.3 10*3/uL (ref 3.4–10.8)

## 2019-06-24 NOTE — Telephone Encounter (Signed)
Phone call placed to vein and vascular surgery to clarify the procedure for preoperative evaluation and the physician for which to for the note.

## 2019-06-24 NOTE — Telephone Encounter (Signed)
Contacted [facility] Dr. Dorothey Baseman office and spoke with Rip Harbour regarding the message received from [employer] of Jacquelyn's concern regarding the patients peg tube  Being possibly infected and that the patient needs to be seen soon.  Appt scheduled with Dr. Allen Norris on 06/29/19 @ 3:30pm at their [place] office.  Contacted the patient and lmom with the [name] appt info. Patient is to contact their office directly at [phone] is she needs to reschedule.

## 2019-06-24 NOTE — Telephone Encounter (Signed)
The PA from [facility] heart [facility] called and want the paper work requesting the clearence  For pre op  Faxed over for this pt the only procedure I see she is having is a feeding tube removed on the 1st on June 1st the  surgery scheduler is scheduling the procedure and no clarence is needed do to the referral being sent internal.

## 2019-06-24 NOTE — Telephone Encounter (Signed)
-----   Message from Arvil Chaco, PA-C sent at 06/24/2019 12:10 PM EDT ----- Regarding: PEG tube This is the patient we discussed that is getting worried / anxious regarding her PEG tube.   I have asked Maddie Maia Breslow, PA-C (nephrology) to take a look at the PEG tube and weigh in as well- she follows the patient for HD and has noted the area around it looks crusty and has expressed concern regarding infection vs  self-removal by the patient as well.   She is reportedly scheduled for removal with Dr. Allen Norris 08/04/2019.   Thank you so much!

## 2019-06-24 NOTE — Telephone Encounter (Signed)
Call received from [place] [facility]. Charlena Cross is returning Marrianne Mood, [place] call regarding the need for pre-op clearance. They currently do not have the patient scheduled for an upcoming surgery or procedure. If and when the patient is scheduled for either [facility] will fax a formal pre-op letter requesting cardiac clearane to our office. Advised Ebony that I will update Jacquelyn.

## 2019-06-24 NOTE — Telephone Encounter (Signed)
It looks like we are placing an access and she needs our clearance paperwork faxed to her so that she can sign off on it.

## 2019-06-25 ENCOUNTER — Telehealth: Payer: Self-pay | Admitting: Physician Assistant

## 2019-06-25 ENCOUNTER — Other Ambulatory Visit: Payer: Self-pay | Admitting: Family Medicine

## 2019-06-25 NOTE — Telephone Encounter (Signed)
An internal referral was sent to [facility] on 05/19/19 for a  cardiac clearance for the patient. The patient is now scheduled for surgery with Dr. Lucky Cowboy on 07/09/19.

## 2019-06-25 NOTE — Telephone Encounter (Signed)
Attempted to call the patient. No answer- I left a message to please call back.  

## 2019-06-25 NOTE — Telephone Encounter (Signed)
Arvil Chaco, PA-C  06/25/2019 4:35 PM EDT    Please let Ms. Harper know that her labs showed slightly elevated potassium at 4.9 though still within normal range. Her glucose was slightly elevated at 185; however, this is with consideration that she was not fasting for her labs. Her hemoglobin was stable when compared with that of previous labs. Overall, reassuring labs.  She is scheduled for her AV graft procedure on 07/09/19 with vascular surgery. They confirmed receipt of her preoperative evaluation yesterday.  She is scheduled with Dr. Allen Norris of GI on 06/29/19 at 3:30pm in the [facility] office, regarding her PEG tube.   [facility] was contacted yesterday by our office and updated on her new medications and appointments.

## 2019-06-26 ENCOUNTER — Telehealth (INDEPENDENT_AMBULATORY_CARE_PROVIDER_SITE_OTHER): Payer: Self-pay

## 2019-06-26 NOTE — Telephone Encounter (Signed)
I attempted to contact the patient's spouse and a message was left for a return call.

## 2019-06-29 ENCOUNTER — Other Ambulatory Visit: Payer: Self-pay

## 2019-06-29 ENCOUNTER — Emergency Department: Payer: Medicare Other

## 2019-06-29 ENCOUNTER — Observation Stay
Admission: EM | Admit: 2019-06-29 | Discharge: 2019-06-30 | Disposition: A | Discharge status: Home / Self Care | Payer: Medicare Other | Attending: Internal Medicine | Admitting: Internal Medicine

## 2019-06-29 ENCOUNTER — Ambulatory Visit: Payer: Medicare Other | Admitting: Gastroenterology

## 2019-06-29 DIAGNOSIS — I255 Ischemic cardiomyopathy: Secondary | ICD-10-CM | POA: Insufficient documentation

## 2019-06-29 DIAGNOSIS — I10 Essential (primary) hypertension: Secondary | ICD-10-CM | POA: Diagnosis not present

## 2019-06-29 DIAGNOSIS — J9811 Atelectasis: Secondary | ICD-10-CM | POA: Diagnosis not present

## 2019-06-29 DIAGNOSIS — E039 Hypothyroidism, unspecified: Secondary | ICD-10-CM | POA: Insufficient documentation

## 2019-06-29 DIAGNOSIS — F329 Major depressive disorder, single episode, unspecified: Secondary | ICD-10-CM | POA: Insufficient documentation

## 2019-06-29 DIAGNOSIS — I5032 Chronic diastolic (congestive) heart failure: Secondary | ICD-10-CM

## 2019-06-29 DIAGNOSIS — M6281 Muscle weakness (generalized): Secondary | ICD-10-CM | POA: Insufficient documentation

## 2019-06-29 DIAGNOSIS — Z931 Gastrostomy status: Secondary | ICD-10-CM | POA: Insufficient documentation

## 2019-06-29 DIAGNOSIS — I132 Hypertensive heart and chronic kidney disease with heart failure and with stage 5 chronic kidney disease, or end stage renal disease: Principal | ICD-10-CM | POA: Insufficient documentation

## 2019-06-29 DIAGNOSIS — R079 Chest pain, unspecified: Secondary | ICD-10-CM | POA: Insufficient documentation

## 2019-06-29 DIAGNOSIS — K219 Gastro-esophageal reflux disease without esophagitis: Secondary | ICD-10-CM | POA: Diagnosis not present

## 2019-06-29 DIAGNOSIS — Z992 Dependence on renal dialysis: Secondary | ICD-10-CM | POA: Diagnosis not present

## 2019-06-29 DIAGNOSIS — I160 Hypertensive urgency: Secondary | ICD-10-CM | POA: Diagnosis not present

## 2019-06-29 DIAGNOSIS — Z86718 Personal history of other venous thrombosis and embolism: Secondary | ICD-10-CM | POA: Diagnosis not present

## 2019-06-29 DIAGNOSIS — I482 Chronic atrial fibrillation, unspecified: Secondary | ICD-10-CM | POA: Insufficient documentation

## 2019-06-29 DIAGNOSIS — R112 Nausea with vomiting, unspecified: Secondary | ICD-10-CM | POA: Diagnosis not present

## 2019-06-29 DIAGNOSIS — N2581 Secondary hyperparathyroidism of renal origin: Secondary | ICD-10-CM | POA: Diagnosis not present

## 2019-06-29 DIAGNOSIS — R197 Diarrhea, unspecified: Secondary | ICD-10-CM | POA: Diagnosis not present

## 2019-06-29 DIAGNOSIS — I700 Atherosclerosis of aorta: Secondary | ICD-10-CM | POA: Diagnosis not present

## 2019-06-29 DIAGNOSIS — Z20822 Contact with and (suspected) exposure to covid-19: Secondary | ICD-10-CM | POA: Diagnosis not present

## 2019-06-29 DIAGNOSIS — Z951 Presence of aortocoronary bypass graft: Secondary | ICD-10-CM | POA: Diagnosis not present

## 2019-06-29 DIAGNOSIS — E785 Hyperlipidemia, unspecified: Secondary | ICD-10-CM | POA: Diagnosis not present

## 2019-06-29 DIAGNOSIS — Z7901 Long term (current) use of anticoagulants: Secondary | ICD-10-CM | POA: Diagnosis not present

## 2019-06-29 DIAGNOSIS — E1122 Type 2 diabetes mellitus with diabetic chronic kidney disease: Secondary | ICD-10-CM | POA: Diagnosis not present

## 2019-06-29 DIAGNOSIS — N186 End stage renal disease: Secondary | ICD-10-CM | POA: Diagnosis not present

## 2019-06-29 DIAGNOSIS — I252 Old myocardial infarction: Secondary | ICD-10-CM | POA: Insufficient documentation

## 2019-06-29 DIAGNOSIS — D631 Anemia in chronic kidney disease: Secondary | ICD-10-CM | POA: Diagnosis not present

## 2019-06-29 DIAGNOSIS — I2510 Atherosclerotic heart disease of native coronary artery without angina pectoris: Secondary | ICD-10-CM | POA: Diagnosis not present

## 2019-06-29 DIAGNOSIS — I159 Secondary hypertension, unspecified: Secondary | ICD-10-CM | POA: Diagnosis present

## 2019-06-29 DIAGNOSIS — F32A Depression, unspecified: Secondary | ICD-10-CM | POA: Diagnosis present

## 2019-06-29 DIAGNOSIS — E876 Hypokalemia: Secondary | ICD-10-CM | POA: Insufficient documentation

## 2019-06-29 DIAGNOSIS — J90 Pleural effusion, not elsewhere classified: Secondary | ICD-10-CM | POA: Diagnosis not present

## 2019-06-29 DIAGNOSIS — I82622 Acute embolism and thrombosis of deep veins of left upper extremity: Secondary | ICD-10-CM | POA: Diagnosis not present

## 2019-06-29 DIAGNOSIS — Z79899 Other long term (current) drug therapy: Secondary | ICD-10-CM | POA: Insufficient documentation

## 2019-06-29 DIAGNOSIS — I120 Hypertensive chronic kidney disease with stage 5 chronic kidney disease or end stage renal disease: Secondary | ICD-10-CM | POA: Diagnosis not present

## 2019-06-29 DIAGNOSIS — R0789 Other chest pain: Secondary | ICD-10-CM | POA: Diagnosis not present

## 2019-06-29 DIAGNOSIS — K429 Umbilical hernia without obstruction or gangrene: Secondary | ICD-10-CM | POA: Insufficient documentation

## 2019-06-29 DIAGNOSIS — R262 Difficulty in walking, not elsewhere classified: Secondary | ICD-10-CM | POA: Insufficient documentation

## 2019-06-29 DIAGNOSIS — R1013 Epigastric pain: Secondary | ICD-10-CM | POA: Diagnosis present

## 2019-06-29 DIAGNOSIS — Z7982 Long term (current) use of aspirin: Secondary | ICD-10-CM | POA: Insufficient documentation

## 2019-06-29 DIAGNOSIS — Z79890 Hormone replacement therapy (postmenopausal): Secondary | ICD-10-CM | POA: Insufficient documentation

## 2019-06-29 LAB — RESPIRATORY PANEL BY RT PCR (FLU A&B, COVID)
Influenza A by PCR: NEGATIVE
Influenza B by PCR: NEGATIVE
SARS Coronavirus 2 by RT PCR: NEGATIVE

## 2019-06-29 LAB — HEPATIC FUNCTION PANEL
ALT: 12 U/L (ref 0–44)
AST: 89 U/L — ABNORMAL HIGH (ref 15–41)
Albumin: 2.8 g/dL — ABNORMAL LOW (ref 3.5–5.0)
Alkaline Phosphatase: 53 U/L (ref 38–126)
Bilirubin, Direct: 0.5 mg/dL — ABNORMAL HIGH (ref 0.0–0.2)
Indirect Bilirubin: 1 mg/dL — ABNORMAL HIGH (ref 0.3–0.9)
Total Bilirubin: 1.5 mg/dL — ABNORMAL HIGH (ref 0.3–1.2)
Total Protein: 7 g/dL (ref 6.5–8.1)

## 2019-06-29 LAB — CBC
HCT: 37.6 % (ref 36.0–46.0)
Hemoglobin: 11.8 g/dL — ABNORMAL LOW (ref 12.0–15.0)
MCH: 28.9 pg (ref 26.0–34.0)
MCHC: 31.4 g/dL (ref 30.0–36.0)
MCV: 92.2 fL (ref 80.0–100.0)
Platelets: 236 10*3/uL (ref 150–400)
RBC: 4.08 MIL/uL (ref 3.87–5.11)
RDW: 18.6 % — ABNORMAL HIGH (ref 11.5–15.5)
WBC: 6.5 10*3/uL (ref 4.0–10.5)
nRBC: 0.3 % — ABNORMAL HIGH (ref 0.0–0.2)

## 2019-06-29 LAB — BASIC METABOLIC PANEL
Anion gap: 13 (ref 5–15)
BUN: 26 mg/dL — ABNORMAL HIGH (ref 8–23)
CO2: 26 mmol/L (ref 22–32)
Calcium: 9 mg/dL (ref 8.9–10.3)
Chloride: 96 mmol/L — ABNORMAL LOW (ref 98–111)
Creatinine, Ser: 6.31 mg/dL — ABNORMAL HIGH (ref 0.44–1.00)
GFR calc Af Amer: 7 mL/min — ABNORMAL LOW (ref >60)
GFR calc non Af Amer: 6 mL/min — ABNORMAL LOW (ref >60)
Glucose, Bld: 148 mg/dL — ABNORMAL HIGH (ref 70–99)
Potassium: 5 mmol/L (ref 3.5–5.1)
Sodium: 135 mmol/L (ref 135–145)

## 2019-06-29 LAB — TROPONIN I (HIGH SENSITIVITY)
Troponin I (High Sensitivity): 10 ng/L (ref <18)
Troponin I (High Sensitivity): 10 ng/L (ref <18)
Troponin I (High Sensitivity): 10 ng/L (ref <18)
Troponin I (High Sensitivity): 11 ng/L (ref <18)

## 2019-06-29 LAB — LIPASE, BLOOD: Lipase: 14 U/L (ref 11–51)

## 2019-06-29 MED ORDER — NITROGLYCERIN 0.4 MG SL SUBL: 0.4000 mg | SUBLINGUAL_TABLET | SUBLINGUAL | Status: DC | PRN

## 2019-06-29 MED ORDER — ACETAMINOPHEN 325 MG PO TABS: 650.0000 mg | ORAL_TABLET | Freq: Four times a day (QID) | ORAL | Status: DC | PRN

## 2019-06-29 MED ORDER — CHLORHEXIDINE GLUCONATE CLOTH 2 % EX PADS
6.0000 | MEDICATED_PAD | Freq: Every day | CUTANEOUS | Status: DC
  Administered 2019-06-30: 6 via TOPICAL
  Filled 2019-06-29: qty 6

## 2019-06-29 MED ORDER — FREE WATER: 20.0000 mL | Status: DC

## 2019-06-29 MED ORDER — FERROUS GLUCONATE 324 (38 FE) MG PO TABS
324.0000 mg | ORAL_TABLET | Freq: Every day | ORAL | Status: DC
  Administered 2019-06-30 (×2): 324 mg via ORAL
  Filled 2019-06-29 (×2): qty 1

## 2019-06-29 MED ORDER — FLUOXETINE HCL 20 MG PO CAPS
20.0000 mg | ORAL_CAPSULE | Freq: Every day | ORAL | Status: DC
  Administered 2019-06-30 (×2): 20 mg via ORAL
  Filled 2019-06-29 (×2): qty 1

## 2019-06-29 MED ORDER — ASPIRIN EC 81 MG PO TBEC
81.0000 mg | DELAYED_RELEASE_TABLET | Freq: Every day | ORAL | Status: DC
  Administered 2019-06-30: 81 mg via ORAL
  Filled 2019-06-29: qty 1

## 2019-06-29 MED ORDER — APIXABAN 5 MG PO TABS
5.0000 mg | ORAL_TABLET | Freq: Two times a day (BID) | ORAL | Status: DC
  Administered 2019-06-30 (×2): 5 mg via ORAL
  Filled 2019-06-29 (×2): qty 1

## 2019-06-29 MED ORDER — LEVOTHYROXINE SODIUM 88 MCG PO TABS
88.0000 ug | ORAL_TABLET | Freq: Every day | ORAL | Status: DC
  Administered 2019-06-30: 88 ug via ORAL
  Filled 2019-06-29 (×2): qty 1

## 2019-06-29 MED ORDER — NITROGLYCERIN 2 % TD OINT
1.0000 [in_us] | TOPICAL_OINTMENT | Freq: Once | TRANSDERMAL | Status: DC
  Filled 2019-06-29: qty 1

## 2019-06-29 MED ORDER — ONDANSETRON HCL 4 MG PO TABS: 4.0000 mg | ORAL_TABLET | Freq: Four times a day (QID) | ORAL | Status: DC | PRN

## 2019-06-29 MED ORDER — ACETAMINOPHEN 650 MG RE SUPP: 650.0000 mg | Freq: Four times a day (QID) | RECTAL | Status: DC | PRN

## 2019-06-29 MED ORDER — MELATONIN 5 MG PO TABS: 5.0000 mg | ORAL_TABLET | Freq: Every evening | ORAL | Status: DC | PRN

## 2019-06-29 MED ORDER — SUCRALFATE 1 G PO TABS
1.0000 g | ORAL_TABLET | Freq: Once | ORAL | Status: AC
  Administered 2019-06-29: 1 g via ORAL
  Filled 2019-06-29: qty 1

## 2019-06-29 MED ORDER — ONDANSETRON HCL 4 MG/2ML IJ SOLN
4.0000 mg | Freq: Once | INTRAMUSCULAR | Status: AC
  Administered 2019-06-29: 4 mg via INTRAVENOUS
  Filled 2019-06-29: qty 2

## 2019-06-29 MED ORDER — MORPHINE SULFATE (PF) 4 MG/ML IV SOLN
4.0000 mg | INTRAVENOUS | Status: DC | PRN
  Administered 2019-06-29: 4 mg via INTRAVENOUS
  Filled 2019-06-29: qty 1

## 2019-06-29 MED ORDER — IPRATROPIUM-ALBUTEROL 0.5-2.5 (3) MG/3ML IN SOLN
3.0000 mL | Freq: Four times a day (QID) | RESPIRATORY_TRACT | Status: DC
  Administered 2019-06-30 (×2): 3 mL via RESPIRATORY_TRACT
  Filled 2019-06-29: qty 3

## 2019-06-29 MED ORDER — PANTOPRAZOLE SODIUM 40 MG PO PACK
40.0000 mg | PACK | Freq: Two times a day (BID) | ORAL | Status: DC
  Filled 2019-06-29 (×2): qty 20

## 2019-06-29 MED ORDER — HYDROCORTISONE 1 % EX CREA
TOPICAL_CREAM | CUTANEOUS | Status: DC | PRN
  Filled 2019-06-29: qty 28

## 2019-06-29 MED ORDER — LABETALOL HCL 5 MG/ML IV SOLN
10.0000 mg | Freq: Once | INTRAVENOUS | Status: AC
  Administered 2019-06-29: 10 mg via INTRAVENOUS
  Filled 2019-06-29: qty 4

## 2019-06-29 MED ORDER — FOLIC ACID 1 MG PO TABS
1.0000 mg | ORAL_TABLET | Freq: Every day | ORAL | Status: DC
  Administered 2019-06-30: 1 mg via ORAL
  Filled 2019-06-29: qty 1

## 2019-06-29 MED ORDER — TRAZODONE HCL 50 MG PO TABS
50.0000 mg | ORAL_TABLET | Freq: Every day | ORAL | Status: DC
  Administered 2019-06-30: 50 mg via ORAL
  Filled 2019-06-29: qty 1

## 2019-06-29 MED ORDER — RENA-VITE PO TABS
1.0000 | ORAL_TABLET | Freq: Every day | ORAL | Status: DC
  Administered 2019-06-30: 1 via ORAL
  Filled 2019-06-29 (×2): qty 1

## 2019-06-29 MED ORDER — HYDRALAZINE HCL 10 MG PO TABS
10.0000 mg | ORAL_TABLET | Freq: Three times a day (TID) | ORAL | Status: DC
  Administered 2019-06-30 (×3): 10 mg via ORAL
  Filled 2019-06-29 (×5): qty 1

## 2019-06-29 MED ORDER — OSMOLITE 1.5 CAL PO LIQD: 1000.0000 mL | ORAL | Status: DC

## 2019-06-29 MED ORDER — PROMETHAZINE HCL 25 MG/ML IJ SOLN: 12.5000 mg | Freq: Four times a day (QID) | INTRAMUSCULAR | Status: DC | PRN

## 2019-06-29 MED ORDER — ATORVASTATIN CALCIUM 20 MG PO TABS
40.0000 mg | ORAL_TABLET | Freq: Every day | ORAL | Status: DC
  Administered 2019-06-30: 40 mg via ORAL
  Filled 2019-06-29: qty 2

## 2019-06-29 MED ORDER — ALBUTEROL SULFATE (2.5 MG/3ML) 0.083% IN NEBU: 3.0000 mL | INHALATION_SOLUTION | RESPIRATORY_TRACT | Status: DC | PRN

## 2019-06-29 MED ORDER — ASCORBIC ACID 500 MG PO TABS
500.0000 mg | ORAL_TABLET | Freq: Every day | ORAL | Status: DC
  Administered 2019-06-30: 500 mg via ORAL
  Filled 2019-06-29: qty 1

## 2019-06-29 MED ORDER — ONDANSETRON HCL 4 MG/2ML IJ SOLN: 4.0000 mg | Freq: Four times a day (QID) | INTRAMUSCULAR | Status: DC | PRN

## 2019-06-29 MED ORDER — MORPHINE SULFATE (PF) 2 MG/ML IV SOLN: 2.0000 mg | INTRAVENOUS | Status: DC | PRN

## 2019-06-29 MED ORDER — FLUTICASONE PROPIONATE 50 MCG/ACT NA SUSP
2.0000 | Freq: Every day | NASAL | Status: DC | PRN
  Filled 2019-06-29: qty 16

## 2019-06-29 MED ORDER — PRO-STAT SUGAR FREE PO LIQD: 30.0000 mL | Freq: Every day | ORAL | Status: DC

## 2019-06-29 MED ORDER — GABAPENTIN 100 MG PO CAPS
100.0000 mg | ORAL_CAPSULE | Freq: Three times a day (TID) | ORAL | Status: DC
  Administered 2019-06-30 (×3): 100 mg via ORAL
  Filled 2019-06-29 (×3): qty 1

## 2019-06-29 MED ORDER — HYDRALAZINE HCL 20 MG/ML IJ SOLN: 5.0000 mg | INTRAMUSCULAR | Status: DC | PRN

## 2019-06-29 MED ORDER — ZINC SULFATE 220 (50 ZN) MG PO CAPS
220.0000 mg | ORAL_CAPSULE | Freq: Every day | ORAL | Status: DC
  Administered 2019-06-30: 220 mg via ORAL
  Filled 2019-06-29: qty 1

## 2019-06-29 MED ORDER — HYDRALAZINE HCL 20 MG/ML IJ SOLN: 10.0000 mg | INTRAMUSCULAR | Status: DC | PRN

## 2019-06-29 NOTE — ED Notes (Signed)
Pt states coming in due to chest pian. Pt states pain started last night and that he had one episode of SOB. Pt denies current chest pain. Pt to go to dialysis today Pt has cardiac, bp and pulse ox monitoring on

## 2019-06-29 NOTE — Progress Notes (Signed)
[facility]  ROUNDING NOTE   Subjective:   [name] admitted to [facility] on 06/29/2019 for CP   Husband at bedside. Patient's last hemodialysis was Friday. She was scheduled for a PEG removal. Patient with nausea/vomiting, chest pain and shortness of breath. She is unable to take PO and has not taken her home medications.   Objective:  Vital signs in last 24 hours:  Temp:  [98.2 F (36.8 C)] 98.2 F (36.8 C) (04/26 1202) Pulse Rate:  [71-75] 71 (04/26 1410) Resp:  [16-18] 16 (04/26 1410) BP: (199-210)/(86) 210/86 (04/26 1410) SpO2:  [98 %] 98 % (04/26 1410) Weight:  [80.7 kg] 80.7 kg (04/26 1203)  Weight change:  Filed Weights   06/29/19 1203  Weight: 80.7 kg    Intake/Output: No intake/output data recorded.   Intake/Output this shift:  No intake/output data recorded.  Physical Exam: General: NAD,   Head: Normocephalic, atraumatic. Moist oral mucosal membranes  Eyes: Anicteric, PERRL  Neck: Supple, trachea midline  Lungs:  Clear to auscultation  Heart: Regular rate and rhythm  Abdomen:  Soft, nontender, +PEG  Extremities:  trace peripheral edema.  Neurologic: Nonfocal, moving all four extremities  Skin: No lesions  Access: LIJ permcath    Basic Metabolic Panel: Recent Labs  Lab 06/23/19 1600 06/29/19 1156  NA 138 135  K 4.9 5.0  CL 97 96*  CO2 28 26  GLUCOSE 185* 148*  BUN 21 26*  CREATININE 4.30* 6.31*  CALCIUM 8.4* 9.0    Liver Function Tests: Recent Labs  Lab 06/29/19 1223  AST 89*  ALT 12  ALKPHOS 53  BILITOT 1.5*  PROT 7.0  ALBUMIN 2.8*   Recent Labs  Lab 06/29/19 1223  LIPASE 14   No results for input(s): AMMONIA in the last 168 hours.  CBC: Recent Labs  Lab 06/23/19 1600 06/29/19 1156  WBC 5.3 6.5  HGB 10.8* 11.8*  HCT 35.9 37.6  MCV 95 92.2  PLT 194 236    Cardiac Enzymes: No results for input(s): CKTOTAL, CKMB, CKMBINDEX, TROPONINI in the last 168 hours.  BNP: Invalid input(s):  POCBNP  CBG: No results for input(s): GLUCAP in the last 168 hours.  Microbiology: Results for orders placed or performed during the hospital encounter of 02/09/19  Culture, blood (routine x 2)     Status: Abnormal   Collection Time: 02/09/19  2:29 PM   Specimen: BLOOD LEFT ARM  Result Value Ref Range Status   Specimen Description   Final    BLOOD LEFT ARM Performed at [facility], [address] [place], [place] [address]    Special Requests   Final    BOTTLES DRAWN AEROBIC AND ANAEROBIC Blood Culture adequate volume Performed at [facility], [address], [place], [place] [address]    Culture  Setup Time   Final    GRAM POSITIVE COCCI IN BOTH AEROBIC AND ANAEROBIC BOTTLES CRITICAL VALUE NOTED.  VALUE IS CONSISTENT WITH PREVIOUSLY REPORTED AND CALLED VALUE. Performed at [facility], [address] [place], [place] [address]    Culture (A)  Final    STAPHYLOCOCCUS AUREUS SUSCEPTIBILITIES PERFORMED ON PREVIOUS CULTURE WITHIN THE LAST 5 DAYS. Performed at [facility], [place] [address], [place], [place] [address]    Report Status 02/12/2019 FINAL  Final  Culture, blood (routine x 2)     Status: Abnormal   Collection Time: 02/09/19  2:38 PM   Specimen: BLOOD LEFT ARM  Result Value  Ref Range Status   Specimen Description   Final    BLOOD LEFT ARM Performed at [facility], [address] [place], [place] [address]    Special Requests   Final    BOTTLES DRAWN AEROBIC AND ANAEROBIC Blood Culture adequate volume Performed at [facility], [address] [place], [place] [address]    Culture  Setup Time   Final    GRAM POSITIVE COCCI IN BOTH AEROBIC AND ANAEROBIC BOTTLES CRITICAL RESULT CALLED TO, READ BACK BY AND VERIFIED WITH: [place] [id number] 02/10/2019 HNM Performed at [facility], [place] [address], [place], [place] [address]    Culture METHICILLIN RESISTANT STAPHYLOCOCCUS AUREUS (A)  Final   Report  Status 02/12/2019 FINAL  Final   Organism ID, Bacteria METHICILLIN RESISTANT STAPHYLOCOCCUS AUREUS  Final      Susceptibility   Methicillin resistant staphylococcus aureus - MIC*    CIPROFLOXACIN >=8 RESISTANT Resistant     ERYTHROMYCIN >=8 RESISTANT Resistant     GENTAMICIN <=0.5 SENSITIVE Sensitive     OXACILLIN >=4 RESISTANT Resistant     TETRACYCLINE <=1 SENSITIVE Sensitive     VANCOMYCIN 1 SENSITIVE Sensitive     TRIMETH/SULFA <=10 SENSITIVE Sensitive     CLINDAMYCIN RESISTANT Resistant     RIFAMPIN <=0.5 SENSITIVE Sensitive     Inducible Clindamycin POSITIVE Resistant     * METHICILLIN RESISTANT STAPHYLOCOCCUS AUREUS  Blood Culture ID Panel (Reflexed)     Status: Abnormal   Collection Time: 02/09/19  2:38 PM  Result Value Ref Range Status   Enterococcus species NOT DETECTED NOT DETECTED Final   Listeria monocytogenes NOT DETECTED NOT DETECTED Final   Staphylococcus species DETECTED (A) NOT DETECTED Final    Comment: CRITICAL RESULT CALLED TO, READ BACK BY AND VERIFIED WITH: Hart Robinsons PHARMD 2025 02/10/2019 HNM    Staphylococcus aureus (BCID) DETECTED (A) NOT DETECTED Final    Comment: Methicillin (oxacillin)-resistant Staphylococcus aureus (MRSA). MRSA is predictably resistant to beta-lactam antibiotics (except ceftaroline). Preferred therapy is vancomycin unless clinically contraindicated. Patient requires contact precautions if  hospitalized. CRITICAL RESULT CALLED TO, READ BACK BY AND VERIFIED WITH: Hart Robinsons PHARMD [id number] 02/10/2019 HNM    Methicillin resistance DETECTED (A) NOT DETECTED Final    Comment: CRITICAL RESULT CALLED TO, READ BACK BY AND VERIFIED WITH: Hart Robinsons PHARMD [phone] 02/10/2019 HNM    Streptococcus species NOT DETECTED NOT DETECTED Final   Streptococcus agalactiae NOT DETECTED NOT DETECTED Final   Streptococcus pneumoniae NOT DETECTED NOT DETECTED Final   Streptococcus pyogenes NOT DETECTED NOT DETECTED Final   Acinetobacter baumannii NOT DETECTED NOT  DETECTED Final   Enterobacteriaceae species NOT DETECTED NOT DETECTED Final   Enterobacter cloacae complex NOT DETECTED NOT DETECTED Final   Escherichia coli NOT DETECTED NOT DETECTED Final   Klebsiella oxytoca NOT DETECTED NOT DETECTED Final   Klebsiella pneumoniae NOT DETECTED NOT DETECTED Final   Proteus species NOT DETECTED NOT DETECTED Final   Serratia marcescens NOT DETECTED NOT DETECTED Final   Haemophilus influenzae NOT DETECTED NOT DETECTED Final   Neisseria meningitidis NOT DETECTED NOT DETECTED Final   Pseudomonas aeruginosa NOT DETECTED NOT DETECTED Final   Candida albicans NOT DETECTED NOT DETECTED Final   Candida glabrata NOT DETECTED NOT DETECTED Final   Candida krusei NOT DETECTED NOT DETECTED Final   Candida parapsilosis NOT DETECTED NOT DETECTED Final   Candida tropicalis NOT DETECTED NOT DETECTED Final    Comment: Performed at [facility], [address] [place], [place] [address]  SARS CORONAVIRUS 2 (TAT 6-24 HRS) Nasopharyngeal Nasopharyngeal Swab     Status: None   Collection Time: 02/09/19  2:54 PM   Specimen: Nasopharyngeal Swab  Result Value Ref Range Status   SARS Coronavirus 2 NEGATIVE NEGATIVE Final    Comment: (NOTE) SARS-CoV-2 target nucleic acids are NOT DETECTED. The SARS-CoV-2 RNA is generally detectable in upper and lower respiratory specimens during the acute phase of infection. Negative results do not preclude SARS-CoV-2 infection, do not rule out co-infections with other pathogens, and should not be used as the sole basis for treatment or other patient management decisions. Negative results must be combined with clinical observations, patient history, and epidemiological information. The expected result is Negative. Fact Sheet for Patients: SugarRoll.be Fact Sheet for Healthcare Providers: https://www.woods-mathews.com/ This test is not yet approved or cleared by the [place] FDA and   has been authorized for detection and/or diagnosis of SARS-CoV-2 by FDA under an Emergency Use Authorization (EUA). This EUA will remain  in effect (meaning this test can be used) for the duration of the COVID-19 declaration under Section 56 4(b)(1) of the Act, 21 U.S.C. section 360bbb-3(b)(1), unless the authorization is terminated or revoked sooner. Performed at [facility], [place] [address], [place], [place] [address]   MRSA PCR Screening     Status: Abnormal   Collection Time: 02/10/19  5:03 PM   Specimen: Nasopharyngeal  Result Value Ref Range Status   MRSA by PCR POSITIVE (A) NEGATIVE Final    Comment:        The GeneXpert MRSA Assay (FDA approved for NASAL specimens only), is one component of a comprehensive MRSA colonization surveillance program. It is not intended to diagnose MRSA infection nor to guide or monitor treatment for MRSA infections. RESULT CALLED TO, READ BACK BY AND VERIFIED WITH: ALEKSEY FRASER @1839  02/10/19 MJU Performed at [facility], [address] [place], [place] [address]   Culture, blood (routine x 2)     Status: Abnormal   Collection Time: 02/11/19 12:31 PM   Specimen: BLOOD  Result Value Ref Range Status   Specimen Description   Final    BLOOD PORTA CATH Performed at [place] [address] [place], [place] [address]    Special Requests   Final    BOTTLES DRAWN AEROBIC AND ANAEROBIC Blood Culture adequate volume Performed at [facility], [address] [place], [place] [address]    Culture  Setup Time   Final    GRAM POSITIVE COCCI ANAEROBIC BOTTLE ONLY CRITICAL RESULT CALLED TO, READ BACK BY AND VERIFIED WITH: SCOTT HALL AT 8242 02/12/2019 [facility]  Performed at [facility], [address] [place], [place] [address]    Culture (A)  Final    STAPHYLOCOCCUS AUREUS SUSCEPTIBILITIES PERFORMED ON PREVIOUS CULTURE WITHIN THE LAST 5 DAYS. Performed at [facility], [place] [address] [place], [place] [address]    Report Status 02/13/2019 FINAL  Final  Culture, blood (routine x 2)     Status: None   Collection Time: 02/11/19  3:57 PM   Specimen: BLOOD  Result Value Ref Range Status   Specimen Description BLOOD PORTA CATH  Final   Special Requests   Final    BOTTLES DRAWN AEROBIC AND ANAEROBIC Blood Culture adequate volume   Culture   Final    NO GROWTH 5 DAYS Performed at [facility], [address], [place],  [address]  Report Status 02/16/2019 FINAL  Final  CULTURE, BLOOD (ROUTINE X 2) w Reflex to ID Panel     Status: Abnormal   Collection Time: 02/13/19  2:18 PM   Specimen: BLOOD LEFT HAND  Result Value Ref Range Status   Specimen Description BLOOD LEFT HAND  Final   Special Requests   Final    BOTTLES DRAWN AEROBIC AND ANAEROBIC Blood Culture results may not be optimal due to an inadequate volume of blood received in culture bottles   Culture  Setup Time   Final    AEROBIC BOTTLE ONLY GRAM POSITIVE COCCI IN CLUSTERS CRITICAL RESULT CALLED TO, READ BACK BY AND VERIFIED WITH: Herbert Pun AT 9030 ON 02/14/2019 [address]. Performed at [facility], [place] [address], [place], [place] [address]    Culture METHICILLIN RESISTANT STAPHYLOCOCCUS AUREUS (A)  Final   Report Status 02/16/2019 FINAL  Final   Organism ID, Bacteria METHICILLIN RESISTANT STAPHYLOCOCCUS AUREUS  Final      Susceptibility   Methicillin resistant staphylococcus aureus - MIC*    CIPROFLOXACIN >=8 RESISTANT Resistant     ERYTHROMYCIN >=8 RESISTANT Resistant     GENTAMICIN <=0.5 SENSITIVE Sensitive     OXACILLIN >=4 RESISTANT Resistant     TETRACYCLINE <=1 SENSITIVE Sensitive     VANCOMYCIN <=0.5 SENSITIVE Sensitive     TRIMETH/SULFA <=10 SENSITIVE Sensitive     CLINDAMYCIN RESISTANT Resistant     RIFAMPIN <=0.5 SENSITIVE Sensitive     Inducible Clindamycin POSITIVE Resistant     * METHICILLIN RESISTANT STAPHYLOCOCCUS AUREUS  Blood Culture ID Panel (Reflexed)      Status: Abnormal   Collection Time: 02/13/19  2:18 PM  Result Value Ref Range Status   Enterococcus species NOT DETECTED NOT DETECTED Final   Listeria monocytogenes NOT DETECTED NOT DETECTED Final   Staphylococcus species DETECTED (A) NOT DETECTED Final    Comment: CRITICAL RESULT CALLED TO, READ BACK BY AND VERIFIED WITH: ABBEY ELLINGTON AT 0076 ON 02/14/2019 [place].    Staphylococcus aureus (BCID) DETECTED (A) NOT DETECTED Final    Comment: Methicillin (oxacillin)-resistant Staphylococcus aureus (MRSA). MRSA is predictably resistant to beta-lactam antibiotics (except ceftaroline). Preferred therapy is vancomycin unless clinically contraindicated. Patient requires contact precautions if  hospitalized. CRITICAL RESULT CALLED TO, READ BACK BY AND VERIFIED WITH: Herbert Pun AT 2263 ON 02/14/2019 [place].    Methicillin resistance DETECTED (A) NOT DETECTED Final    Comment: CRITICAL RESULT CALLED TO, READ BACK BY AND VERIFIED WITH: Herbert Pun AT 3354 ON 02/14/2019 [place].    Streptococcus species NOT DETECTED NOT DETECTED Final   Streptococcus agalactiae NOT DETECTED NOT DETECTED Final   Streptococcus pneumoniae NOT DETECTED NOT DETECTED Final   Streptococcus pyogenes NOT DETECTED NOT DETECTED Final   Acinetobacter baumannii NOT DETECTED NOT DETECTED Final   Enterobacteriaceae species NOT DETECTED NOT DETECTED Final   Enterobacter cloacae complex NOT DETECTED NOT DETECTED Final   Escherichia coli NOT DETECTED NOT DETECTED Final   Klebsiella oxytoca NOT DETECTED NOT DETECTED Final   Klebsiella pneumoniae NOT DETECTED NOT DETECTED Final   Proteus species NOT DETECTED NOT DETECTED Final   Serratia marcescens NOT DETECTED NOT DETECTED Final   Haemophilus influenzae NOT DETECTED NOT DETECTED Final   Neisseria meningitidis NOT DETECTED NOT DETECTED Final   Pseudomonas aeruginosa NOT DETECTED NOT DETECTED Final   Candida albicans NOT DETECTED NOT DETECTED Final   Candida glabrata NOT DETECTED  NOT DETECTED Final   Candida krusei NOT DETECTED NOT DETECTED Final   Candida  parapsilosis NOT DETECTED NOT DETECTED Final   Candida tropicalis NOT DETECTED NOT DETECTED Final    Comment: Performed at [facility], [address] [place], [place] [address]  CULTURE, BLOOD (ROUTINE X 2) w Reflex to ID Panel     Status: None   Collection Time: 02/13/19  3:09 PM   Specimen: BLOOD  Result Value Ref Range Status   Specimen Description BLOOD BLOOD LEFT HAND  Final   Special Requests   Final    BOTTLES DRAWN AEROBIC AND ANAEROBIC Blood Culture adequate volume   Culture   Final    NO GROWTH 5 DAYS Performed at [facility], [address], [place], [place] [address]    Report Status 02/18/2019 FINAL  Final  Culture, blood (routine x 2)     Status: Abnormal   Collection Time: 02/15/19 12:49 PM   Specimen: BLOOD  Result Value Ref Range Status   Specimen Description   Final    BLOOD LT HAND Performed at [facility], [address] [place], [place] [address]    Special Requests   Final    BOTTLES DRAWN AEROBIC AND ANAEROBIC Blood Culture results may not be optimal due to an inadequate volume of blood received in culture bottles Performed at [facility], [address] [place], [place] [address]    Culture  Setup Time   Final    GRAM POSITIVE COCCI IN BOTH AEROBIC AND ANAEROBIC BOTTLES CRITICAL RESULT CALLED TO, READ BACK BY AND VERIFIED WITHEleonore Chiquito AT 8101 02/16/2019 [place] Performed at [facility], [address] [place], [place] [address]    Culture (A)  Final    METHICILLIN RESISTANT STAPHYLOCOCCUS AUREUS STAPHYLOCOCCUS SPECIES (COAGULASE NEGATIVE) THE SIGNIFICANCE OF ISOLATING THIS ORGANISM FROM A SINGLE SET OF BLOOD CULTURES WHEN MULTIPLE SETS ARE DRAWN IS UNCERTAIN. PLEASE NOTIFY THE MICROBIOLOGY DEPARTMENT WITHIN ONE WEEK IF SPECIATION AND SENSITIVITIES ARE REQUIRED. Performed at [facility], [place] [address]  [place], [place] [address]    Report Status 02/18/2019 FINAL  Final   Organism ID, Bacteria METHICILLIN RESISTANT STAPHYLOCOCCUS AUREUS  Final      Susceptibility   Methicillin resistant staphylococcus aureus - MIC*    CIPROFLOXACIN >=8 RESISTANT Resistant     ERYTHROMYCIN >=8 RESISTANT Resistant     GENTAMICIN <=0.5 SENSITIVE Sensitive     OXACILLIN >=4 RESISTANT Resistant     TETRACYCLINE <=1 SENSITIVE Sensitive     VANCOMYCIN <=0.5 SENSITIVE Sensitive     TRIMETH/SULFA <=10 SENSITIVE Sensitive     CLINDAMYCIN RESISTANT Resistant     RIFAMPIN <=0.5 SENSITIVE Sensitive     Inducible Clindamycin POSITIVE Resistant     * METHICILLIN RESISTANT STAPHYLOCOCCUS AUREUS  Aerobic Culture (superficial specimen)     Status: None   Collection Time: 02/17/19  1:31 PM   Specimen: Heel; Wound  Result Value Ref Range Status   Specimen Description   Final    HEEL Performed at [facility], [address], [place], [place] [address]    Special Requests   Final    NONE Performed at [facility], [address] [place], [place] [address]    Gram Stain   Final    NO WBC SEEN MODERATE GRAM POSITIVE COCCI IN PAIRS    Culture   Final    ABUNDANT METHICILLIN RESISTANT STAPHYLOCOCCUS AUREUS ABUNDANT DIPHTHEROIDS(CORYNEBACTERIUM SPECIES) Standardized susceptibility testing for this organism is not available. Performed at [facility], [place] [address]  [place], [place] [address]    Report Status 02/19/2019 FINAL  Final   Organism ID, Bacteria METHICILLIN RESISTANT STAPHYLOCOCCUS AUREUS  Final      Susceptibility   Methicillin resistant staphylococcus aureus - MIC*    CIPROFLOXACIN >=8 RESISTANT Resistant     ERYTHROMYCIN >=8 RESISTANT Resistant     GENTAMICIN <=0.5 SENSITIVE Sensitive     OXACILLIN >=4 RESISTANT Resistant     TETRACYCLINE <=1 SENSITIVE Sensitive     VANCOMYCIN 1 SENSITIVE Sensitive     TRIMETH/SULFA <=10 SENSITIVE Sensitive     CLINDAMYCIN RESISTANT  Resistant     RIFAMPIN <=0.5 SENSITIVE Sensitive     Inducible Clindamycin POSITIVE Resistant     * ABUNDANT METHICILLIN RESISTANT STAPHYLOCOCCUS AUREUS  CULTURE, BLOOD (ROUTINE X 2) w Reflex to ID Panel     Status: None   Collection Time: 02/18/19 11:29 PM   Specimen: BLOOD  Result Value Ref Range Status   Specimen Description BLOOD LEFT ARM  Final   Special Requests   Final    BOTTLES DRAWN AEROBIC AND ANAEROBIC Blood Culture adequate volume   Culture   Final    NO GROWTH 5 DAYS Performed at [facility], [address] [place], [place] [address]    Report Status 02/23/2019 FINAL  Final  CULTURE, BLOOD (ROUTINE X 2) w Reflex to ID Panel     Status: None   Collection Time: 02/18/19 11:29 PM   Specimen: BLOOD  Result Value Ref Range Status   Specimen Description BLOOD LEFT ARM  Final   Special Requests   Final    BOTTLES DRAWN AEROBIC AND ANAEROBIC Blood Culture adequate volume   Culture   Final    NO GROWTH 5 DAYS Performed at [facility], [address] [place], [place] [address]    Report Status 02/23/2019 FINAL  Final  Aerobic/Anaerobic Culture (surgical/deep wound)     Status: None   Collection Time: 03/04/19 11:04 AM   Specimen: PATH Other; Tissue  Result Value Ref Range Status   Specimen Description   Final    ARTERY Performed at [facility], [address], [place], [place] [address]    Special Requests   Final    RIGHT AV GRAFT AND STENTS CULTURE Performed at [facility], [address] [place], [place] [address]    Gram Stain   Final    RARE WBC PRESENT, PREDOMINANTLY PMN NO ORGANISMS SEEN    Culture   Final    No growth aerobically or anaerobically. Performed at [facility], [place] [address], [place], [place] [address]    Report Status 03/09/2019 FINAL  Final  Aerobic/Anaerobic Culture (surgical/deep wound)     Status: None   Collection Time: 03/04/19 11:29 AM   Specimen: PATH Other; Tissue  Result Value  Ref Range Status   Specimen Description   Final    ARTERY Performed at [facility], [address] [place], [place] [address]    Special Requests   Final    FLUID AROUND AV GRAFT Performed at [facility], [address] [place], [place] [address]    Gram Stain   Final    FEW WBC PRESENT, PREDOMINANTLY PMN NO ORGANISMS SEEN    Culture   Final    No growth aerobically or anaerobically. Performed at [facility], [place] [address], [place], [place] [address]    Report Status 03/09/2019 FINAL  Final  Wet prep, genital  Status: Abnormal   Collection Time: 03/16/19 10:01 AM  Result Value Ref Range Status   Yeast Wet Prep HPF POC PRESENT (A) NONE SEEN Final   Trich, Wet Prep NONE SEEN NONE SEEN Final   Clue Cells Wet Prep HPF POC NONE SEEN NONE SEEN Final   WBC, Wet Prep HPF POC MANY (A) NONE SEEN Final   Sperm NONE SEEN  Final    Comment: Performed at [facility], [address] [place], [place] [address]  Aerobic Culture (superficial specimen)     Status: None   Collection Time: 03/17/19  5:13 PM   Specimen: Vaginal Fluid  Result Value Ref Range Status   Specimen Description   Final    VAGINA Performed at [facility], [address], [place], [place] [address]    Special Requests   Final    NONE Performed at [facility], [address] [place], [place] [address]    Gram Stain   Final    MODERATE WBC PRESENT,BOTH PMN AND MONONUCLEAR FEW GRAM POSITIVE COCCI IN PAIRS FEW GRAM NEGATIVE RODS Performed at [facility], [place] [address] [place], [place] [address]    Culture   Final    ABUNDANT ESCHERICHIA COLI MODERATE KLEBSIELLA PNEUMONIAE    Report Status 03/21/2019 FINAL  Final   Organism ID, Bacteria ESCHERICHIA COLI  Final   Organism ID, Bacteria KLEBSIELLA PNEUMONIAE  Final      Susceptibility   Escherichia coli - MIC*    AMPICILLIN 4 SENSITIVE Sensitive     CEFAZOLIN <=4 SENSITIVE Sensitive      CEFEPIME <=0.12 SENSITIVE Sensitive     CEFTAZIDIME <=1 SENSITIVE Sensitive     CEFTRIAXONE <=0.25 SENSITIVE Sensitive     CIPROFLOXACIN <=0.25 SENSITIVE Sensitive     GENTAMICIN <=1 SENSITIVE Sensitive     IMIPENEM <=0.25 SENSITIVE Sensitive     TRIMETH/SULFA <=20 SENSITIVE Sensitive     AMPICILLIN/SULBACTAM <=2 SENSITIVE Sensitive     PIP/TAZO <=4 SENSITIVE Sensitive     * ABUNDANT ESCHERICHIA COLI   Klebsiella pneumoniae - MIC*    AMPICILLIN >=32 RESISTANT Resistant     CEFAZOLIN <=4 SENSITIVE Sensitive     CEFEPIME <=0.12 SENSITIVE Sensitive     CEFTAZIDIME <=1 SENSITIVE Sensitive     CEFTRIAXONE <=0.25 SENSITIVE Sensitive     CIPROFLOXACIN <=0.25 SENSITIVE Sensitive     GENTAMICIN <=1 SENSITIVE Sensitive     IMIPENEM <=0.25 SENSITIVE Sensitive     TRIMETH/SULFA <=20 SENSITIVE Sensitive     AMPICILLIN/SULBACTAM 4 SENSITIVE Sensitive     PIP/TAZO <=4 SENSITIVE Sensitive     * MODERATE KLEBSIELLA PNEUMONIAE  Anaerobic culture     Status: None   Collection Time: 03/20/19 12:40 PM   Specimen: PATH Cytology CSF; Cerebrospinal Fluid  Result Value Ref Range Status   Specimen Description   Final    CSF Performed at [facility], [address] [place], [place] [address]    Special Requests   Final    NONE Performed at [facility], [address] [place], [place] [address]    Gram Stain   Final    WBC PRESENT, PREDOMINANTLY MONONUCLEAR NO ORGANISMS SEEN CYTOSPIN SMEAR    Culture   Final    NO ANAEROBES ISOLATED Performed at [facility], [address] [place], [place] [address]    Report Status 03/27/2019 FINAL  Final  CSF culture     Status: None   Collection Time: 03/20/19  12:40 PM   Specimen: PATH Cytology CSF; Cerebrospinal Fluid  Result Value Ref Range Status   Specimen Description   Final    CSF Performed at [facility], [address] [place], [place] [address]    Special Requests   Final    NONE Performed at [facility], [address] [place], [place] [address]    Gram Stain   Final    WBC PRESENT, PREDOMINANTLY MONONUCLEAR NO ORGANISMS SEEN CYTOSPIN SMEAR    Culture   Final    NO GROWTH 3 DAYS Performed at [facility], [place] [address], [place], [place] [address]    Report Status 03/24/2019 FINAL  Final  Fungus Culture With Stain     Status: None   Collection Time: 03/20/19 12:40 PM   Specimen: PATH Cytology CSF; Cerebrospinal Fluid  Result Value Ref Range Status   Fungus Stain Final report  Final   Fungus (Mycology) Culture Final report  Final    Comment: (NOTE) Performed At: [facility] [place], [place] [id number] Rush Farmer MD [id number]    Fungal Source CSF  Final    Comment: Performed at [facility], [address] [place], [place] [address]  Fungus Culture Result     Status: None   Collection Time: 03/20/19 12:40 PM  Result Value Ref Range Status   Result 1 Comment  Final    Comment: (NOTE) KOH/Calcofluor preparation:  no fungus observed. Performed At: [facility] [place], [place] [id number] Rush Farmer MD BP:[id number]   Fungal organism reflex     Status: None   Collection Time: 03/20/19 12:40 PM  Result Value Ref Range Status   Fungal result 1 Comment  Final    Comment: (NOTE) No yeast or mold isolated after 4 weeks. Performed At: [facility] [place], [place] [id number] Rush Farmer MD ER:[id number]   MRSA PCR Screening     Status: None   Collection Time: 04/03/19  8:15 PM   Specimen: Nasopharyngeal  Result Value Ref Range Status   MRSA by PCR NEGATIVE NEGATIVE Final    Comment:        The GeneXpert MRSA Assay (FDA approved for NASAL specimens only), is one component of a comprehensive MRSA colonization surveillance program. It is not intended to diagnose MRSA infection nor to guide or monitor treatment for MRSA infections. Performed at [facility], [address] [place], [place] [address]   Respiratory Panel by RT PCR (Flu A&B, Covid) - Nasopharyngeal Swab     Status: None   Collection Time: 04/20/19  3:44 PM   Specimen: Nasopharyngeal Swab  Result Value Ref Range Status   SARS Coronavirus 2 by RT PCR NEGATIVE NEGATIVE Final    Comment: (NOTE) SARS-CoV-2 target nucleic acids are NOT DETECTED. The SARS-CoV-2 RNA is generally detectable in upper respiratoy specimens during the acute phase of infection. The lowest concentration of SARS-CoV-2 viral copies this assay can detect is 131 copies/mL. A negative result does not preclude SARS-Cov-2 infection and should not be used as the sole basis for treatment or other patient management decisions. A negative result may occur with  improper specimen collection/handling, submission of specimen other than nasopharyngeal swab, presence of viral mutation(s) within the areas targeted by this assay, and inadequate number of viral copies (<131 copies/mL). A negative result must be combined with clinical observations, patient history, and epidemiological information. The expected result is Negative. Fact Sheet for  Patients:  PinkCheek.be Fact Sheet for Healthcare Providers:  GravelBags.it This test is not yet ap proved or cleared by the [place] and  has been authorized for detection and/or diagnosis of SARS-CoV-2 by FDA under an Emergency Use Authorization (EUA). This EUA will remain  in effect (meaning this test can be used) for the duration of the COVID-19 declaration under Section 564(b)(1) of the Act, 21 U.S.C. section 360bbb-3(b)(1), unless the authorization is terminated or revoked sooner.    Influenza A by PCR NEGATIVE NEGATIVE Final   Influenza B by PCR NEGATIVE NEGATIVE Final    Comment: (NOTE) The Xpert Xpress SARS-CoV-2/FLU/RSV assay is intended as an aid in  the diagnosis of influenza from  Nasopharyngeal swab specimens and  should not be used as a sole basis for treatment. Nasal washings and  aspirates are unacceptable for Xpert Xpress SARS-CoV-2/FLU/RSV  testing. Fact Sheet for Patients: PinkCheek.be Fact Sheet for Healthcare Providers: GravelBags.it This test is not yet approved or cleared by the [place] FDA and  has been authorized for detection and/or diagnosis of SARS-CoV-2 by  FDA under an Emergency Use Authorization (EUA). This EUA will remain  in effect (meaning this test can be used) for the duration of the  Covid-19 declaration under Section 564(b)(1) of the Act, 21  U.S.C. section 360bbb-3(b)(1), unless the authorization is  terminated or revoked. Performed at [facility], [address] [place], [place] [address]     Coagulation Studies: No results for input(s): LABPROT, INR in the last 72 hours.  Urinalysis: No results for input(s): COLORURINE, LABSPEC, PHURINE, GLUCOSEU, HGBUR, BILIRUBINUR, KETONESUR, PROTEINUR, UROBILINOGEN, NITRITE, LEUKOCYTESUR in the last 72 hours.  Invalid input(s): APPERANCEUR    Imaging: CT ABDOMEN PELVIS WO CONTRAST  Result Date: 06/29/2019 CLINICAL DATA:  Nausea, vomiting, diarrhea, chest pain since yesterday EXAM: CT ABDOMEN AND PELVIS WITHOUT CONTRAST TECHNIQUE: Multidetector CT imaging of the abdomen and pelvis was performed following the standard protocol without IV contrast. COMPARISON:  06/29/2019, 10/23/2015 FINDINGS: Lower chest: There are small bilateral pleural effusions. Rounded atelectasis again seen within the left lower lobe. Heart is enlarged without pericardial effusion. Hepatobiliary: No focal liver abnormality is seen. Status post cholecystectomy. No biliary dilatation. Pancreas: Unremarkable. No pancreatic ductal dilatation or surrounding inflammatory changes. Spleen: Normal in size without focal abnormality. Adrenals/Urinary Tract:  Kidneys are atrophic. Calcifications of the bilateral hila are felt to be vascular. No definite urinary tract calculi or obstruction. Bladder is moderately distended without filling defect. Mild perivesicular fat stranding is nonspecific, and could reflect cystitis in the appropriate setting. Please correlate with urinalysis. The adrenals are unremarkable. Stomach/Bowel: No bowel obstruction or ileus. Normal appendix right lower quadrant. Percutaneous gastrostomy tube is seen within the gastric lumen. Vascular/Lymphatic: Aortic atherosclerosis. No enlarged abdominal or pelvic lymph nodes. Reproductive: Uterus and bilateral adnexa are unremarkable. Other: There is mild diffuse body wall edema. No free intraperitoneal fluid or free gas. Small fat containing umbilical hernia is unchanged. Musculoskeletal: No acute displaced fractures. Reconstructed images demonstrate no additional findings. IMPRESSION: 1. Small bilateral pleural effusions. 2. Mild perivesicular fat stranding is nonspecific, and could reflect cystitis in the appropriate setting. Please correlate with urinalysis. 3. Mild diffuse body wall edema. 4. Stable rounded atelectasis left lower lobe. 5. Aortic Atherosclerosis (ICD10-I70.0). Electronically Signed   By: Randa Ngo M.D.   On: 06/29/2019 13:56   DG Chest 2 View  Result Date: 06/29/2019 CLINICAL DATA:  Shortness of breath and chest pain EXAM: CHEST - 2 VIEW COMPARISON:  April 16, 2019 FINDINGS: Central catheter tip is in the superior vena cava. No pneumothorax. There is interstitial edema with small left pleural effusion. No airspace consolidation. There is cardiomegaly with mild pulmonary venous hypertension. Patient is status post coronary artery bypass grafting. No adenopathy. No bone lesions. IMPRESSION: Cardiomegaly with pulmonary vascular congestion. Interstitial edema with left pleural effusion. Appearance is felt to be indicative of a degree of congestive heart failure. No airspace  consolidation. Postoperative changes. Central catheter tip in superior vena cava. Electronically Signed   By: Lowella Grip III M.D.   On: 06/29/2019 13:05     Medications:    . [START ON 06/30/2019] Chlorhexidine Gluconate Cloth  6 each Topical Q0600  . nitroGLYCERIN  1 inch Topical Once   hydrALAZINE, morphine injection, promethazine  Assessment/ Plan:  [name] is a 64 y.o. white female with end stage renal disease on hemodialysis, hypertension, pulmonary hypertension, diabetes mellitus type II, diabetic neuropathy, congestive heart failure who is admitted to [facility] on 06/29/2019 for CP . Patient was scheduled for a PEG removal today.   [facility] MWF LIJ permcath 81.5kg  1. End Stage Renal Disease: last hemodialysis was Friday. Patient's appears volume overloaded.  - Hemodialysis for later today. Orders prepared.   2. Hypertension: elevated. 210/86. Recently started on hydralazine. However patient unable to take her PO medications due to nausea/vomiting and volume -- hemodialysis treatment will help.   3. Anemia of chronic kidney disease: hemoglobin 11.8. Mircera as outpatient.   4. Secondary Hyperparathyroidism: PTH 420 on 4/7. Not on any binders currently.     LOS: 0 Jessicah Croll 4/26/20213:35 PM

## 2019-06-29 NOTE — Telephone Encounter (Signed)
No answer. Left detailed message with results, ok per DPR, and to call back if any questions. Also left message with husband's number, ok per DPR, to check her phone number for results and call us back if any further questions.

## 2019-06-29 NOTE — Progress Notes (Signed)
Pre HD Assessment    06/29/19 1631  Neurological  Level of Consciousness Alert  Orientation Level Oriented X4  Respiratory  Respiratory Pattern Regular;Unlabored  Chest Assessment Chest expansion symmetrical  Bilateral Breath Sounds Clear  Cardiac  Pulse Regular  Heart Sounds S1, S2  Jugular Venous Distention (JVD) No  ECG Monitor Yes  Cardiac Rhythm SB  Vascular  R Radial Pulse +2  L Radial Pulse +2  Psychosocial  Psychosocial (WDL) X

## 2019-06-29 NOTE — H&P (Signed)
History and Physical    Jamie Arias FFM:[id number] DOB: [birth year] DOA: 06/29/2019  Referring MD/NP/PA:   PCP: Jamie Haven, MD   Patient coming from:  The patient is coming from home.  At baseline, pt is independent for most of ADL.        Chief Complaint: Chest pain, nausea, vomiting, diarrhea  HPI: Jamie Arias is a 64 y.o. female with medical history significant of hypertension, hyperlipidemia, diabetes mellitus, GERD, hypothyroidism, depression, CAD, CABG, ESRD-HD (MWF), dCHF, iron deficiency anemia, atrial fibrillation and left arm DVT on Eliquis, feeding tube placement, who presents with chest pain, nausea, vomiting, diarrhea.  Patient states that she has been having nausea vomiting and diarrhea since yesterday.  She has had at least 7 times of nonbilious nonbloody vomiting and 5 times of watery diarrhea in the past 24 hours.  No abdominal pain.  No fever or chills.  Patient states that she could not take her oral medications due to severe nausea and vomiting.  She developed chest pain since last night, which is located in the substernal area, dull, nonradiating, initially 8 out of 10 in severity, currently chest pain-free.  No shortness of breath.  Patient is still making little urine, but no dysuria or burning on urination.  No unilateral weakness.  Patient has elevated blood pressure 222/98, which improved to 161/75 after treated with 10 mg of labetalol in the ED.  Patient missed dialysis today.  ED Course: pt was found to have troponin 10, negative Covid PCR, potassium of 5.0, bicarbonate of 26, creatinine 6.31, BUN 26, temperature normal, heart rate 71, oxygen saturation 98% on room air, RR 16, chest x-ray showed cardiomegaly, vascular congestion and interstitial edema and left pleural effusion.  Patient is placed on progressive bed for observation  # CT of abdomen/pelvis: 1. Small bilateral pleural effusions. 2. Mild perivesicular fat stranding is nonspecific, and could  reflect cystitis in the appropriate setting.3. Mild diffuse body wall edema. 4. Stable rounded atelectasis left lower lobe. 5. Aortic Atherosclerosis (ICD10-I70.  Review of Systems:   General: no fevers, chills, no body weight gain, has poor appetite, has fatigue HEENT: no blurry vision, hearing changes or sore throat Respiratory: no dyspnea, coughing, wheezing CV: has chest pain, no palpitations GI: has nausea, vomiting,  diarrhea, no constipation, abdominal pain, GU: no dysuria, burning on urination, increased urinary frequency, hematuria  Ext: no leg edema Neuro: no unilateral weakness, numbness, or tingling, no vision change or hearing loss Skin: no rash, no skin tear. MSK: No muscle spasm, no deformity, no limitation of range of movement in spin Heme: No easy bruising.  Travel history: No recent long distant travel.  Allergy:  Allergies  Allergen Reactions  . Nsaids Other (See Comments)    Contraindicated due to kidney disease.  Marland Kitchen Doxycycline Other (See Comments)    tremor    Past Medical History:  Diagnosis Date  . Anemia   . Chronic diastolic CHF (congestive heart failure) ([place])    a. Due to ischemic cardiomyopathy. EF as low as 35%, improved to normal s/p CABG; b. echo 07/06/13: EF 55-60%, no RWMAs, mod TR, trivial pericardial effusion not c/w tamponade physiology;  c. 10/2015 Echo: EF 65%, Gr1 DD, triv AI, mild MR, mildly dil LA, mod TR, PASP 17mmHg.  Marland Kitchen Coronary artery disease    a. NSTEMI 06/2013; b.cath: severe three-vessel CAD w/ EF 30% & mild-mod MR; c. s/p 3 vessel CABG 07/02/13 (LIMA-LAD, SVG-OM, and SVG-RPDA);  d. 10/2015 MV: no ischemia/infarct.  Marland Kitchen  Diabetes mellitus without complication ([place])   . Diabetic neuropathy ([place])   . Dialysis patient [facility])    MWF  . ESRD (end stage renal disease) ([place])    a. 12/2015 initiated - mwf dialysis.  Marland Kitchen GERD (gastroesophageal reflux disease)   . Hyperlipidemia   . Hypertension   . Hypothyroidism   . Myocardial infarction ([place])  2015  . Neuropathy   . Pleural effusion 2015  . Pulmonary hypertension ([place])   . Renal insufficiency   . Wears dentures    full lower    Past Surgical History:  Procedure Laterality Date  . A/V FISTULAGRAM Right 12/17/2016   Procedure: A/V Fistulagram;  Surgeon: Algernon Huxley, MD;  Location: [place] [facility];  Service: Cardiovascular;  Laterality: Right;  . A/V FISTULAGRAM Right 01/07/2017   Procedure: A/V Fistulagram;  Surgeon: Algernon Huxley, MD;  Location: [place] [facility];  Service: Cardiovascular;  Laterality: Right;  . A/V FISTULAGRAM Right 12/03/2017   Procedure: A/V FISTULAGRAM;  Surgeon: Katha Cabal, MD;  Location: [place] [facility];  Service: Cardiovascular;  Laterality: Right;  . A/V FISTULAGRAM Right 02/23/2019   Procedure: A/V Fistulagram;  Surgeon: Algernon Huxley, MD;  Location: [place] [facility];  Service: Cardiovascular;  Laterality: Right;  . A/V SHUNT INTERVENTION N/A 02/22/2017   Procedure: A/V SHUNT INTERVENTION;  Surgeon: Algernon Huxley, MD;  Location: [place] [facility];  Service: Cardiovascular;  Laterality: N/A;  . AV FISTULA PLACEMENT Right 02/03/2016   Procedure: INSERTION OF ARTERIOVENOUS (AV) GORE-TEX GRAFT ARM ( BRACH / AXILLARY );  Surgeon: Katha Cabal, MD;  Location: [facility];  Service: Vascular;  Laterality: Right;  . [place] REMOVAL Right 03/04/2019   Procedure: REMOVAL OF ARTERIOVENOUS GORETEX GRAFT ([place]);  Surgeon: Algernon Huxley, MD;  Location: [facility];  Service: Vascular;  Laterality: Right;  . CARDIAC CATHETERIZATION    . CATARACT EXTRACTION Bilateral   . CHOLECYSTECTOMY N/A 12/09/2014   Procedure: LAPAROSCOPIC CHOLECYSTECTOMY;  Surgeon: Marlyce [name], MD;  Location: [facility];  Service: General;  Laterality: N/A;  . CORONARY ARTERY BYPASS GRAFT N/A 07/02/2013   Procedure: CORONARY ARTERY BYPASS GRAFTING (CABG);  Surgeon: Ivin [name], MD;  Location: [place];  Service: Open Heart Surgery;  Laterality: N/A;  CABG x three, using  left internal mammary artery and right leg greater saphenous vein harvested endoscopically  . ESOPHAGOGASTRODUODENOSCOPY N/A 04/11/2019   Procedure: ESOPHAGOGASTRODUODENOSCOPY (EGD);  Surgeon: Jonathon Bellows, MD;  Location: [facility]OS[facility];  Service: Gastroenterology;  Laterality: N/A;  . ESOPHAGOGASTRODUODENOSCOPY (EGD) WITH PROPOFOL N/A 11/24/2015   Procedure: ESOPHAGOGASTRODUODENOSCOPY (EGD) WITH PROPOFOL;  Surgeon: [name], MD;  Location: [place];  Service: Endoscopy;  Laterality: N/A;  Diabetic - insulin  . EYE SURGERY Bilateral    Cataract Extraction with IOL  . INTRAOPERATIVE TRANSESOPHAGEAL ECHOCARDIOGRAM N/A 07/02/2013   Procedure: INTRAOPERATIVE TRANSESOPHAGEAL ECHOCARDIOGRAM;  Surgeon: Ivin [name], MD;  Location: [place];  Service: Open Heart Surgery;  Laterality: N/A;  . PEG PLACEMENT N/A 03/03/2019   Procedure: PERCUTANEOUS ENDOSCOPIC GASTROSTOMY (PEG) PLACEMENT;  Surgeon: Virgel Manifold, MD;  Location: [facility] ENDOSCOPY;  Service: Endoscopy;  Laterality: N/A;  . PERIPHERAL VASCULAR CATHETERIZATION Right 12/06/2015   Procedure: Dialysis/Perma Catheter Insertion;  Surgeon: Katha Cabal, MD;  Location: [place] [facility];  Service: Cardiovascular;  Laterality: Right;  . PERIPHERAL VASCULAR THROMBECTOMY Right 09/28/2016   Procedure: Peripheral Vascular Thrombectomy;  Surgeon: Katha Cabal, MD;  Location: [place] [facility];  Service: Cardiovascular;  Laterality: Right;  . PERIPHERAL VASCULAR THROMBECTOMY Right 05/20/2018   Procedure: PERIPHERAL VASCULAR THROMBECTOMY;  Surgeon: Katha Cabal, MD;  Location: [place] [facility];  Service: Cardiovascular;  Laterality: Right;  . PORTA CATH REMOVAL N/A 06/01/2016   Procedure: Glori [name] Cath Removal;  Surgeon: Katha Cabal, MD;  Location: [place] [facility];  Service: Cardiovascular;  Laterality: N/A;  . THORACENTESIS Left 2015    Social History:  reports that she has never smoked. She has never used  smokeless tobacco. She reports that she does not drink alcohol or use drugs.  Family History:  Family History  Problem Relation Age of Onset  . COPD Mother   . Cancer Mother        Lung  . Pulmonary embolism Father   . Diabetes Father   . Diabetes Paternal Grandfather   . Heart disease Maternal Grandmother   . Colon cancer Neg Hx   . Colon polyps Neg Hx   . Esophageal cancer Neg Hx   . Pancreatic cancer Neg Hx   . Liver disease Neg Hx      Prior to Admission medications   Medication Sig Start Date End Date Taking? Authorizing Provider  acetaminophen (TYLENOL) 325 MG tablet Take 650 mg by mouth daily as needed for moderate pain or headache.     [provider]  Amino Acids-Protein Hydrolys (FEEDING SUPPLEMENT, PRO-STAT SUGAR FREE 64,) LIQD Place 30 mLs into feeding tube daily. 04/21/19   Sidney Ace, MD  aspirin EC 81 MG tablet Take 81 mg by mouth daily.    [provider]  atorvastatin (LIPITOR) 40 MG tablet Take 40 mg by mouth daily.    [provider]  ELIQUIS 5 MG TABS tablet Take 5 mg by mouth 2 (two) times daily.  01/20/19   [provider]  ferrous gluconate (FERGON) 324 MG tablet Take 1 tablet (324 mg total) by mouth daily. 04/21/19   Sidney Ace, MD  FLUoxetine (PROZAC) 20 MG capsule Take 20 mg by mouth daily.    [provider]  fluticasone (FLONASE) 50 MCG/ACT nasal spray Place 2 sprays into both nostrils daily. Patient taking differently: Place 2 sprays into both nostrils daily as needed for allergies or rhinitis.  09/17/17   Jamie Haven, MD  folic acid (FOLVITE) 1 MG tablet Take 1 tablet (1 mg total) by mouth daily. 04/21/19   Sidney Ace, MD  gabapentin (NEURONTIN) 100 MG capsule Take 100 mg by mouth 3 (three) times daily.    [provider]  hydrALAZINE (APRESOLINE) 10 MG tablet Take 1 tablet (10 mg total) by mouth in the morning and at bedtime. 06/23/19   Marrianne Mood D, PA-C    hydrocortisone cream 1 % Apply topically as needed for itching. 04/20/19   Sreenath, Sudheer B, MD  ipratropium-albuterol (DUONEB) 0.5-2.5 (3) MG/3ML SOLN Take 3 mLs by nebulization every 4 (four) hours as needed. 04/20/19   Sidney Ace, MD  levothyroxine (SYNTHROID) 88 MCG tablet Take 88 mcg by mouth daily before breakfast.    [provider]  Melatonin 3 MG TABS Take by mouth at bedtime.    [provider]  multivitamin (RENA-VIT) TABS tablet Take 1 tablet by mouth at bedtime. 09/09/18   Hosie Poisson, MD  nitroGLYCERIN (NITROSTAT) 0.4 MG SL tablet DISSOLVE ONE TABLET UNDER THE TONGUE EVERY 5 MINUTES AS NEEDED FOR CHEST PAIN.  DO NOT EXCEED A TOTAL OF 3 DOSES IN 15 MINUTES Patient taking differently:  Place 0.4 mg under the tongue every 5 (five) minutes as needed for chest pain. DISSOLVE ONE TABLET UNDER THE TONGUE EVERY 5 MINUTES AS NEEDED FOR CHEST PAIN.  DO NOT EXCEED A TOTAL OF 3 DOSES IN 15 MINUTES 08/18/18   Wellington Hampshire, MD  Nutritional Supplements (FEEDING SUPPLEMENT, OSMOLITE 1.5 CAL,) LIQD Place 1,000 mLs into feeding tube continuous. 04/20/19   Sidney Ace, MD  nystatin (MYCOSTATIN/NYSTOP) powder Apply 1 application topically 2 (two) times daily.    [provider]  ondansetron (ZOFRAN-ODT) 4 MG disintegrating tablet Take 1 tablet (4 mg total) by mouth every 8 (eight) hours as needed for nausea or vomiting. appt with PCP further refills Dr. Caryl Bis 01/28/19   McLean-Scocuzza, Nino Glow, MD  pantoprazole sodium (PROTONIX) 40 mg/20 mL PACK Place 20 mLs (40 mg total) into feeding tube 2 (two) times daily. 04/20/19   Sidney Ace, MD  SANTYL ointment  04/23/19   [provider]  senna-docusate (SENOKOT-S) 8.6-50 MG tablet Take 1 tablet by mouth at bedtime. 04/20/19   Ralene Muskrat B, MD  sodium zirconium cyclosilicate (LOKELMA) 10 g PACK packet Take 10 g by mouth daily. 04/20/19   Sidney Ace, MD  vitamin C (VITAMIN C) 500 MG  tablet Take 1 tablet (500 mg total) by mouth daily. 09/10/18   Hosie Poisson, MD  Water For Irrigation, Sterile (FREE WATER) SOLN Place 20 mLs into feeding tube every 4 (four) hours. 04/20/19   Ralene Muskrat B, MD  zinc sulfate 220 (50 Zn) MG capsule Take 1 capsule (220 mg total) by mouth daily. 09/10/18   Hosie Poisson, MD    Physical Exam: Vitals:   06/29/19 1633 06/29/19 1645 06/29/19 1700 06/29/19 1715  BP: (!) 154/71 (!) 150/70 (!) 158/55 (!) 127/48  Pulse: 62 62 (!) 58 (!) 58  Resp: 16  12 19   Temp: 98.2 F (36.8 C)     TempSrc: Oral     SpO2: 94% 96% 96% 95%  Weight:      Height:       General: Not in acute distress HEENT:       Eyes: PERRL, EOMI, no scleral icterus.       ENT: No discharge from the ears and nose, no pharynx injection, no tonsillar enlargement.        Neck: No JVD, no bruit, no mass felt. Heme: No neck lymph node enlargement. Cardiac: S1/S2, RRR, No murmurs, No gallops or rubs. Respiratory: No rales, wheezing, rhonchi or rubs. GI: Soft, nondistended, nontender, no rebound pain, no organomegaly, BS present. GU: No hematuria Ext: No pitting leg edema bilaterally. 2+DP/PT pulse bilaterally. Musculoskeletal: No joint deformities, No joint redness or warmth, no limitation of ROM in spin. Skin: No rashes.  Neuro: Alert, oriented X3, cranial nerves II-XII grossly intact, moves all extremities normally.  Psych: Patient is not psychotic, no suicidal or hemocidal ideation.  Labs on Admission: I have personally reviewed following labs and imaging studies  CBC: Recent Labs  Lab 06/23/19 1600 06/29/19 1156  WBC 5.3 6.5  HGB 10.8* 11.8*  HCT 35.9 37.6  MCV 95 92.2  PLT 194 297   Basic Metabolic Panel: Recent Labs  Lab 06/23/19 1600 06/29/19 1156  NA 138 135  K 4.9 5.0  CL 97 96*  CO2 28 26  GLUCOSE 185* 148*  BUN 21 26*  CREATININE 4.30* 6.31*  CALCIUM 8.4* 9.0   GFR: Estimated Creatinine Clearance: 9.1 mL/min (A) (by C-G formula based on SCr of  6.31 mg/dL (H)). Liver Function Tests: Recent Labs  Lab 06/29/19 1223  AST 89*  ALT 12  ALKPHOS 53  BILITOT 1.5*  PROT 7.0  ALBUMIN 2.8*   Recent Labs  Lab 06/29/19 1223  LIPASE 14   No results for input(s): AMMONIA in the last 168 hours. Coagulation Profile: No results for input(s): INR, PROTIME in the last 168 hours. Cardiac Enzymes: No results for input(s): CKTOTAL, CKMB, CKMBINDEX, TROPONINI in the last 168 hours. BNP (last 3 results) No results for input(s): PROBNP in the last 8760 hours. HbA1C: No results for input(s): HGBA1C in the last 72 hours. CBG: No results for input(s): GLUCAP in the last 168 hours. Lipid Profile: No results for input(s): CHOL, HDL, LDLCALC, TRIG, CHOLHDL, LDLDIRECT in the last 72 hours. Thyroid Function Tests: No results for input(s): TSH, T4TOTAL, FREET4, T3FREE, THYROIDAB in the last 72 hours. Anemia Panel: No results for input(s): VITAMINB12, FOLATE, FERRITIN, TIBC, IRON, RETICCTPCT in the last 72 hours. Urine analysis:    Component Value Date/Time   COLORURINE YELLOW (A) 08/23/2018 1209   APPEARANCEUR CLOUDY (A) 08/23/2018 1209   APPEARANCEUR Hazy 07/23/2013 0741   LABSPEC 1.016 08/23/2018 1209   LABSPEC 1.016 07/23/2013 0741   PHURINE 5.0 08/23/2018 1209   GLUCOSEU NEGATIVE 08/23/2018 1209   GLUCOSEU Negative 07/23/2013 0741   HGBUR SMALL (A) 08/23/2018 1209   BILIRUBINUR NEGATIVE 08/23/2018 1209   BILIRUBINUR Negative 07/23/2013 0741   KETONESUR NEGATIVE 08/23/2018 1209   PROTEINUR 100 (A) 08/23/2018 1209   UROBILINOGEN 0.2 06/30/2013 2213   NITRITE NEGATIVE 08/23/2018 1209   LEUKOCYTESUR SMALL (A) 08/23/2018 1209   LEUKOCYTESUR Negative 07/23/2013 0741   Sepsis Labs: @LABRCNTIP (procalcitonin:4,lacticidven:4) ) Recent Results (from the past 240 hour(s))  Respiratory Panel by RT PCR (Flu A&B, Covid) - Nasopharyngeal Swab     Status: None   Collection Time: 06/29/19  3:39 PM   Specimen: Nasopharyngeal Swab  Result Value  Ref Range Status   SARS Coronavirus 2 by RT PCR NEGATIVE NEGATIVE Final    Comment: (NOTE) SARS-CoV-2 target nucleic acids are NOT DETECTED. The SARS-CoV-2 RNA is generally detectable in upper respiratoy specimens during the acute phase of infection. The lowest concentration of SARS-CoV-2 viral copies this assay can detect is 131 copies/mL. A negative result does not preclude SARS-Cov-2 infection and should not be used as the sole basis for treatment or other patient management decisions. A negative result may occur with  improper specimen collection/handling, submission of specimen other than nasopharyngeal swab, presence of viral mutation(s) within the areas targeted by this assay, and inadequate number of viral copies (<131 copies/mL). A negative result must be combined with clinical observations, patient history, and epidemiological information. The expected result is Negative. Fact Sheet for Patients:  PinkCheek.be Fact Sheet for Healthcare Providers:  GravelBags.it This test is not yet ap proved or cleared by the [place] FDA and  has been authorized for detection and/or diagnosis of SARS-CoV-2 by FDA under an Emergency Use Authorization (EUA). This EUA will remain  in effect (meaning this test can be used) for the duration of the COVID-19 declaration under Section 564(b)(1) of the Act, 21 U.S.C. section 360bbb-3(b)(1), unless the authorization is terminated or revoked sooner.    Influenza A by PCR NEGATIVE NEGATIVE Final   Influenza B by PCR NEGATIVE NEGATIVE Final    Comment: (NOTE) The Xpert Xpress SARS-CoV-2/FLU/RSV assay is intended as an aid in  the diagnosis of influenza from Nasopharyngeal swab specimens and  should not be used  as a sole basis for treatment. Nasal washings and  aspirates are unacceptable for Xpert Xpress SARS-CoV-2/FLU/RSV  testing. Fact Sheet for  Patients: PinkCheek.be Fact Sheet for Healthcare Providers: GravelBags.it This test is not yet approved or cleared by the [place] FDA and  has been authorized for detection and/or diagnosis of SARS-CoV-2 by  FDA under an Emergency Use Authorization (EUA). This EUA will remain  in effect (meaning this test can be used) for the duration of the  Covid-19 declaration under Section 564(b)(1) of the Act, 21  U.S.C. section 360bbb-3(b)(1), unless the authorization is  terminated or revoked. Performed at [facility], [address] [place], [place] [address]      Radiological Exams on Admission: CT ABDOMEN PELVIS WO CONTRAST  Result Date: 06/29/2019 CLINICAL DATA:  Nausea, vomiting, diarrhea, chest pain since yesterday EXAM: CT ABDOMEN AND PELVIS WITHOUT CONTRAST TECHNIQUE: Multidetector CT imaging of the abdomen and pelvis was performed following the standard protocol without IV contrast. COMPARISON:  06/29/2019, 10/23/2015 FINDINGS: Lower chest: There are small bilateral pleural effusions. Rounded atelectasis again seen within the left lower lobe. Heart is enlarged without pericardial effusion. Hepatobiliary: No focal liver abnormality is seen. Status post cholecystectomy. No biliary dilatation. Pancreas: Unremarkable. No pancreatic ductal dilatation or surrounding inflammatory changes. Spleen: Normal in size without focal abnormality. Adrenals/Urinary Tract: Kidneys are atrophic. Calcifications of the bilateral hila are felt to be vascular. No definite urinary tract calculi or obstruction. Bladder is moderately distended without filling defect. Mild perivesicular fat stranding is nonspecific, and could reflect cystitis in the appropriate setting. Please correlate with urinalysis. The adrenals are unremarkable. Stomach/Bowel: No bowel obstruction or ileus. Normal appendix right lower quadrant. Percutaneous gastrostomy tube is  seen within the gastric lumen. Vascular/Lymphatic: Aortic atherosclerosis. No enlarged abdominal or pelvic lymph nodes. Reproductive: Uterus and bilateral adnexa are unremarkable. Other: There is mild diffuse body wall edema. No free intraperitoneal fluid or free gas. Small fat containing umbilical hernia is unchanged. Musculoskeletal: No acute displaced fractures. Reconstructed images demonstrate no additional findings. IMPRESSION: 1. Small bilateral pleural effusions. 2. Mild perivesicular fat stranding is nonspecific, and could reflect cystitis in the appropriate setting. Please correlate with urinalysis. 3. Mild diffuse body wall edema. 4. Stable rounded atelectasis left lower lobe. 5. Aortic Atherosclerosis (ICD10-I70.0). Electronically Signed   By: Randa Ngo M.D.   On: 06/29/2019 13:56   DG Chest 2 View  Result Date: 06/29/2019 CLINICAL DATA:  Shortness of breath and chest pain EXAM: CHEST - 2 VIEW COMPARISON:  April 16, 2019 FINDINGS: Central catheter tip is in the superior vena cava. No pneumothorax. There is interstitial edema with small left pleural effusion. No airspace consolidation. There is cardiomegaly with mild pulmonary venous hypertension. Patient is status post coronary artery bypass grafting. No adenopathy. No bone lesions. IMPRESSION: Cardiomegaly with pulmonary vascular congestion. Interstitial edema with left pleural effusion. Appearance is felt to be indicative of a degree of congestive heart failure. No airspace consolidation. Postoperative changes. Central catheter tip in superior vena cava. Electronically Signed   By: Lowella Grip III M.D.   On: 06/29/2019 13:05     EKG: Independently reviewed.  Sinus rhythm, QTC 468, LAE, poor R wave progression, nonspecific T wave change  Assessment/Plan Principal Problem:   Chest pain Active Problems:   Hypothyroid   HTN (hypertension)   HLD (hyperlipidemia)   Type 2 diabetes mellitus with ESRD (end-stage renal disease)  (HCC)   CAD (coronary artery disease)   Atrial fibrillation, chronic ([place])  Depression   Chronic diastolic CHF (congestive heart failure) (HCC)   ESRD (end stage renal disease) (HCC)   Arm DVT (deep venous thromboembolism), acute, left (HCC)   Nausea vomiting and diarrhea   Hypertensive urgency   Chest pain and CAD: Most likely due to demand ischemia secondary to hypertensive urgency, which is due to inability to take oral medications secondary to severe nausea vomiting.  -Placed on progressive bed for observation -Trend troponin -Check A1c, FLP -Aspirin and Lipitor -As needed nitroglycerin and morphine -Repeat EKG in the morning -Blood pressure control  Hypertension and hypertensive urgency:  -Patient received 15 mg of nitroglycerin patch in the ED -As needed hydralazine -Patient received 1 dose of labetalol 10 mg in ED -Continue home oral hydralazine  Hypothyroid -Synthroid  HLD (hyperlipidemia) -Lipitor  Diet controled type 2 diabetes mellitus with ESRD (end-stage renal disease) ([place]): Most recent A1c 5.3, well controled. Patient is not taking meds at home -check CBG qAM  Atrial fibrillation, chronic ([place]): HR 71 -continue Eliquis  Depression: No SI or HI -continiue home med  Chronic diastolic CHF (congestive heart failure) ([place]): 2D echo on 02/10/2019 showed EF of 60-65% with grade 2 diastolic dysfunction.  Patient does not have leg edema or JVD.  CHF is compensated. -Volume management per renal by dialysis  ESRD (end stage renal disease) (MWF): -Renal was consulted, Dr. Juleen China  Arm DVT (deep venous thromboembolism), acute, left ([place]): -on Eliquyis  Nausea vomiting and diarrhea: CT scan of abdomen/pelvis is not impressive.  Lipase normal -Follow-up C. difficile PCR and GI pathogen panel -As needed Zofran for nausea    DVT ppx: Eliquis Code Status: Full code Family Communication:  Yes, patient's husband at bed side Disposition Plan:  Anticipate  discharge back to previous home environment Consults called:  Renal, Dr. Juleen China Admission status:   progressive unit for obs    Status is: Observation The patient remains OBS appropriate and will d/c before 2 midnights. Dispo: The patient is from: Home              Anticipated d/c is to: Home              Anticipated d/c date is: 1 day              Patient currently is not medically stable to d/c.         Date of Service 06/29/2019    Ivor Costa [facility]   If 7PM-7AM, please contact night-coverage [email or web address] 06/29/2019, 5:25 PM

## 2019-06-29 NOTE — ED Notes (Signed)
Pt taken to Hemodialysis with RN and pt on monitor. Report given to Hemodialysis RN

## 2019-06-29 NOTE — Telephone Encounter (Signed)
Patient's husband verbalized understanding of results and comments. He is aware they're on MyChart for viewing as well.

## 2019-06-29 NOTE — Progress Notes (Signed)
HD Initiated     06/29/19 1633  Vital Signs  Temp 98.2 F (36.8 C)  Temp Source Oral  Pulse Rate 62  Pulse Rate Source Dinamap  Resp 16  BP (!) 154/71  BP Location Left Arm  BP Method Automatic  Patient Position (if appropriate) Lying  Oxygen Therapy  SpO2 94 %  O2 Device Room Air  During Hemodialysis Assessment  Blood Flow Rate (mL/min) 300 mL/min  Arterial Pressure (mmHg) -120 mmHg  Venous Pressure (mmHg) 90 mmHg  Transmembrane Pressure (mmHg) 60 mmHg  Ultrafiltration Rate (mL/min) 1000 mL/min  Dialysate Flow Rate (mL/min) 600 ml/min  Conductivity: Machine  15  HD Safety Checks Performed Yes  Dialysis Fluid Bolus Normal Saline  Bolus Amount (mL) 250 mL  Intra-Hemodialysis Comments Tx initiated

## 2019-06-29 NOTE — ED Triage Notes (Signed)
Pt to ED via [facility] from home. Per EMS pt c/o of central CP that started last night. Pt with N/V/D since last night as well. Pt given 2 nitro, 324mg  ASA and 4mg  zofran in route. Pt with hx CABG. Pt also with feeding tube in that was suppose to be removed today.  EMS VS:  O2 98% RA BP 222/98 after nitro 206/100 HR 70s NSR   Pt stating pain has decreased and is 4/10. Pt states she has been able to eat and drink on her own except last night. Pt has dialysis shunt in L arm (MWF).

## 2019-06-29 NOTE — Progress Notes (Signed)
Pre HD     06/29/19 1631  Vital Signs  Temp 98.2 F (36.8 C)  Temp Source Oral  Pulse Rate 63  Pulse Rate Source Monitor  Resp 16  BP (!) 150/64  BP Location Left Arm  BP Method Automatic  Patient Position (if appropriate) Lying  Oxygen Therapy  SpO2 94 %  O2 Device Room Air  Dialysis Weight  Weight  (Unable to weigh pt on stretcher)  Type of Weight Pre-Dialysis  Time-Out for Hemodialysis  What Procedure? HD   Pt Identifiers(min of two) First/Last Name;MRN/Account#;Pt's DOB(use if MRN/Acct# not available  Correct Site? Yes  Correct Side? Yes  Correct Procedure? Yes  Consents Verified? Yes  Rad Studies Available? N/A  Safety Precautions Reviewed? Yes  Engineer, civil (consulting) Number 1  Station Number  [id number])  UF/Alarm Test Passed  Conductivity: Meter 14  Conductivity: Machine  15  pH 7.4  Reverse Osmosis  (WRO 1)  Normal Saline Lot Number [id number]  Dialyzer Lot Number [id number]  Disposable Set Lot Number [id number]  Machine Temperature 98.6 F (37 C)  Musician and Audible Yes  Blood Lines Intact and Secured Yes  Pre Treatment Patient Checks  Vascular access used during treatment Catheter  HD catheter dressing before treatment WDL  Patient is receiving dialysis in a chair  (NO)  Hepatitis B Surface Antigen Results Negative  Date Hepatitis B Surface Antigen Drawn 06/10/19  Isolation Initiated  (NO)  Hemodialysis Consent Verified Yes  Hemodialysis Standing Orders Initiated Yes  ECG (Telemetry) Monitor On Yes  Prime Ordered Normal Saline  Length of  DialysisTreatment -hour(s) 3 Hour(s)  Dialysis Treatment Comments  (Na 140)  Dialyzer Elisio 17H NR  Dialysate 2K;2.5 Ca  Dialysate Flow Ordered 600  Blood Flow Rate Ordered 400 mL/min  Ultrafiltration Goal 2.5 Liters  Dialysis Blood Pressure Support Ordered Normal Saline  Education / Care Plan  Dialysis Education Provided Yes  Documented Education in Care Plan Yes  Outpatient Plan of Care Reviewed  and on Chart Yes

## 2019-06-29 NOTE — ED Provider Notes (Signed)
[facility] Emergency Department Provider Note    First MD Initiated Contact with Patient 06/29/19 1202     (approximate)  I have reviewed the triage vital signs and the nursing notes.   HISTORY  Chief Complaint Chest pain   HPI Jamie Arias is a 64 y.o. female the below listed past medical history presents to the ER for evaluation of epigastric and chest discomfort associated with 24 hours of nausea vomiting and diarrhea starting yesterday.  States the vomiting and diarrhea is nonbloody.  No melena.  No measured fevers.  Was recently on antibiotics.  Is having some crampy epigastric pain that she feels is related to the vomiting.  Does have a history of cardiac disease.  Denies any falls or injury.    Past Medical History:  Diagnosis Date  . Anemia   . Chronic diastolic CHF (congestive heart failure) ([place])    a. Due to ischemic cardiomyopathy. EF as low as 35%, improved to normal s/p CABG; b. echo 07/06/13: EF 55-60%, no RWMAs, mod TR, trivial pericardial effusion not c/w tamponade physiology;  c. 10/2015 Echo: EF 65%, Gr1 DD, triv AI, mild MR, mildly dil LA, mod TR, PASP 68mmHg.  Marland Kitchen Coronary artery disease    a. NSTEMI 06/2013; b.cath: severe three-vessel CAD w/ EF 30% & mild-mod MR; c. s/p 3 vessel CABG 07/02/13 (LIMA-LAD, SVG-OM, and SVG-RPDA);  d. 10/2015 MV: no ischemia/infarct.  . Diabetes mellitus without complication ([place])   . Diabetic neuropathy ([place])   . Dialysis patient [facility])    MWF  . ESRD (end stage renal disease) ([place])    a. 12/2015 initiated - mwf dialysis.  Marland Kitchen GERD (gastroesophageal reflux disease)   . Hyperlipidemia   . Hypertension   . Hypothyroidism   . Myocardial infarction ([place]) 2015  . Neuropathy   . Pleural effusion 2015  . Pulmonary hypertension ([place])   . Renal insufficiency   . Wears dentures    full lower   Family History  Problem Relation Age of Onset  . COPD Mother   . Cancer Mother        Lung  . Pulmonary embolism  Father   . Diabetes Father   . Diabetes Paternal Grandfather   . Heart disease Maternal Grandmother   . Colon cancer Neg Hx   . Colon polyps Neg Hx   . Esophageal cancer Neg Hx   . Pancreatic cancer Neg Hx   . Liver disease Neg Hx    Past Surgical History:  Procedure Laterality Date  . A/V FISTULAGRAM Right 12/17/2016   Procedure: A/V Fistulagram;  Surgeon: Algernon Huxley, MD;  Location: [facility];  Service: Cardiovascular;  Laterality: Right;  . A/V FISTULAGRAM Right 01/07/2017   Procedure: A/V Fistulagram;  Surgeon: Algernon Huxley, MD;  Location: [place] [facility];  Service: Cardiovascular;  Laterality: Right;  . A/V FISTULAGRAM Right 12/03/2017   Procedure: A/V FISTULAGRAM;  Surgeon: Katha Cabal, MD;  Location: [place] [facility];  Service: Cardiovascular;  Laterality: Right;  . A/V FISTULAGRAM Right 02/23/2019   Procedure: A/V Fistulagram;  Surgeon: Algernon Huxley, MD;  Location: [place] [facility];  Service: Cardiovascular;  Laterality: Right;  . A/V SHUNT INTERVENTION N/A 02/22/2017   Procedure: A/V SHUNT INTERVENTION;  Surgeon: Algernon Huxley, MD;  Location: [place] [facility];  Service: Cardiovascular;  Laterality: N/A;  . AV FISTULA PLACEMENT Right 02/03/2016   Procedure: INSERTION OF ARTERIOVENOUS (AV) GORE-TEX GRAFT ARM (  BRACH / AXILLARY );  Surgeon: Katha Cabal, MD;  Location: [facility];  Service: Vascular;  Laterality: Right;  . [place] REMOVAL Right 03/04/2019   Procedure: REMOVAL OF ARTERIOVENOUS GORETEX GRAFT ([place]);  Surgeon: Algernon Huxley, MD;  Location: [facility];  Service: Vascular;  Laterality: Right;  . CARDIAC CATHETERIZATION    . CATARACT EXTRACTION Bilateral   . CHOLECYSTECTOMY N/A 12/09/2014   Procedure: LAPAROSCOPIC CHOLECYSTECTOMY;  Surgeon: Marlyce [name], MD;  Location: [facility];  Service: General;  Laterality: N/A;  . CORONARY ARTERY BYPASS GRAFT N/A 07/02/2013   Procedure: CORONARY ARTERY BYPASS GRAFTING (CABG);  Surgeon: Ivin [name], MD;  Location: [facility];  Service: Open Heart Surgery;  Laterality: N/A;  CABG x three, using left internal mammary artery and right leg greater saphenous vein harvested endoscopically  . ESOPHAGOGASTRODUODENOSCOPY N/A 04/11/2019   Procedure: ESOPHAGOGASTRODUODENOSCOPY (EGD);  Surgeon: Jonathon Bellows, MD;  Location: [facility];  Service: Gastroenterology;  Laterality: N/A;  . ESOPHAGOGASTRODUODENOSCOPY (EGD) WITH PROPOFOL N/A 11/24/2015   Procedure: ESOPHAGOGASTRODUODENOSCOPY (EGD) WITH PROPOFOL;  Surgeon: [name], MD;  Location: [place] [facility];  Service: Endoscopy;  Laterality: N/A;  Diabetic - insulin  . EYE SURGERY Bilateral    Cataract Extraction with IOL  . INTRAOPERATIVE TRANSESOPHAGEAL ECHOCARDIOGRAM N/A 07/02/2013   Procedure: INTRAOPERATIVE TRANSESOPHAGEAL ECHOCARDIOGRAM;  Surgeon: Ivin [name], MD;  Location: [place];  Service: Open Heart Surgery;  Laterality: N/A;  . PEG PLACEMENT N/A 03/03/2019   Procedure: PERCUTANEOUS ENDOSCOPIC GASTROSTOMY (PEG) PLACEMENT;  Surgeon: Virgel Manifold, MD;  Location: [facility];  Service: Endoscopy;  Laterality: N/A;  . PERIPHERAL VASCULAR CATHETERIZATION Right 12/06/2015   Procedure: Dialysis/Perma Catheter Insertion;  Surgeon: Katha Cabal, MD;  Location: [place] [facility];  Service: Cardiovascular;  Laterality: Right;  . PERIPHERAL VASCULAR THROMBECTOMY Right 09/28/2016   Procedure: Peripheral Vascular Thrombectomy;  Surgeon: Katha Cabal, MD;  Location: [place] [facility];  Service: Cardiovascular;  Laterality: Right;  . PERIPHERAL VASCULAR THROMBECTOMY Right 05/20/2018   Procedure: PERIPHERAL VASCULAR THROMBECTOMY;  Surgeon: Katha Cabal, MD;  Location: [place] [facility];  Service: Cardiovascular;  Laterality: Right;  . PORTA CATH REMOVAL N/A 06/01/2016   Procedure: Glori [name] Cath Removal;  Surgeon: Katha Cabal, MD;  Location: [place] [facility];  Service: Cardiovascular;  Laterality: N/A;  .  THORACENTESIS Left 2015   Patient Active Problem List   Diagnosis Date Noted  . Chest pain 06/29/2019  . Hypertensive urgency 06/29/2019  . ESRD (end stage renal disease) on dialysis ([place])   . Palliative care by specialist   . Goals of care, counseling/discussion   . Abscess   . AF (paroxysmal atrial fibrillation) ([place])   . Diarrhea   . Oropharyngeal dysphagia   . Acute metabolic encephalopathy   . Acute blood loss anemia   . Arm DVT (deep venous thromboembolism), acute, left ([place]) 02/13/2019  . Acute encephalopathy 02/13/2019  . Decubitus ulcer of heel, bilateral, stage 2 ([place]) 02/10/2019  . Sepsis due to methicillin resistant Staphylococcus aureus (MRSA) ([place]) 02/10/2019  . MRSA bacteremia 02/10/2019  . Altered level of consciousness 02/09/2019  . Adjustment disorder with depressed mood 08/31/2018  . COVID-19 virus infection 08/24/2018  . Acute on chronic respiratory failure with hypoxia ([place]) 08/24/2018  . UTI (urinary tract infection) 08/24/2018  . Allergic rhinitis 09/19/2017  . Uremia of renal origin 09/04/2017  . Chronic neck pain 07/25/2017  . Hyperkalemia 02/15/2017  . Complication from renal dialysis device 12/01/2016  . ESRD (end  stage renal disease) ([place]) 01/19/2016  . GI bleed 10/25/2015  . Hypertensive heart disease 10/24/2015  . Chronic diastolic CHF (congestive heart failure) ([place]) 10/24/2015  . Unstable angina ([place]) 10/23/2015  . Hypotension 09/02/2015  . Chronic systolic CHF (congestive heart failure) ([place]) 09/02/2015  . Depression 07/27/2015  . Calculus of gallbladder with chronic cholecystitis without obstruction   . Bilateral carotid bruits 11/30/2014  . Low magnesium levels 08/10/2013  . Anemia 08/10/2013  . Constipation 08/10/2013  . Atrial fibrillation, chronic ([place]) 07/30/2013  . Coronary artery disease   . CAD (coronary artery disease) 07/02/2013  . Acute systolic heart failure ([place]) 06/30/2013  . NSTEMI (non-ST elevated myocardial infarction)  ([place]) 06/29/2013  . Vertigo 08/25/2012  . Sleep disorder 05/23/2012  . Hypothyroid 04/27/2012  . HTN (hypertension) 04/27/2012  . HLD (hyperlipidemia) 04/27/2012  . Type 2 diabetes mellitus with ESRD (end-stage renal disease) ([place]) 04/27/2012  . Neuropathy 04/27/2012      Prior to Admission medications   Medication Sig Start Date End Date Taking? Authorizing Provider  acetaminophen (TYLENOL) 325 MG tablet Take 650 mg by mouth daily as needed for moderate pain or headache.    Yes [provider]  Amino Acids-Protein Hydrolys (FEEDING SUPPLEMENT, PRO-STAT SUGAR FREE 64,) LIQD Place 30 mLs into feeding tube daily. 04/21/19  Yes Sreenath, Sudheer B, MD  aspirin EC 81 MG tablet Take 81 mg by mouth daily.   Yes [provider]  atorvastatin (LIPITOR) 40 MG tablet Take 40 mg by mouth daily.   Yes [provider]  ELIQUIS 5 MG TABS tablet Take 5 mg by mouth 2 (two) times daily.  01/20/19  Yes [provider]  ferrous gluconate (FERGON) 324 MG tablet Take 1 tablet (324 mg total) by mouth daily. 04/21/19  Yes Sreenath, Sudheer B, MD  FLUoxetine (PROZAC) 20 MG capsule Take 20 mg by mouth daily.   Yes [provider]  fluticasone (FLONASE) 50 MCG/ACT nasal spray Place 2 sprays into both nostrils daily. Patient taking differently: Place 2 sprays into both nostrils daily as needed for allergies or rhinitis.  09/17/17  Yes [name], MD  folic acid (FOLVITE) 1 MG tablet Take 1 tablet (1 mg total) by mouth daily. 04/21/19  Yes Sreenath, Sudheer B, MD  gabapentin (NEURONTIN) 100 MG capsule Take 100 mg by mouth 3 (three) times daily.   Yes [provider]  hydrALAZINE (APRESOLINE) 10 MG tablet Take 1 tablet (10 mg total) by mouth in the morning and at bedtime. 06/23/19  Yes Marrianne Mood D, PA-C  hydrocortisone cream 1 % Apply topically as needed for itching. 04/20/19  Yes Sreenath, Sudheer B, MD  ipratropium-albuterol (DUONEB) 0.5-2.5 (3) MG/3ML  SOLN Take 3 mLs by nebulization every 4 (four) hours as needed. 04/20/19  Yes Sreenath, Sudheer B, MD  levothyroxine (SYNTHROID) 88 MCG tablet Take 88 mcg by mouth daily before breakfast.   Yes [provider]  Melatonin 3 MG TABS Take 3 mg by mouth at bedtime as needed (sleep).    Yes [provider]  multivitamin (RENA-VIT) TABS tablet Take 1 tablet by mouth at bedtime. 09/09/18  Yes Hosie Poisson, MD  nitroGLYCERIN (NITROSTAT) 0.4 MG SL tablet DISSOLVE ONE TABLET UNDER THE TONGUE EVERY 5 MINUTES AS NEEDED FOR CHEST PAIN.  DO NOT EXCEED A TOTAL OF 3 DOSES IN 15 MINUTES Patient taking differently: Place 0.4 mg under the tongue every 5 (five) minutes as needed for chest pain. DISSOLVE ONE TABLET UNDER THE TONGUE EVERY  5 MINUTES AS NEEDED FOR CHEST PAIN.  DO NOT EXCEED A TOTAL OF 3 DOSES IN 15 MINUTES 08/18/18  Yes Wellington Hampshire, MD  Nutritional Supplements (FEEDING SUPPLEMENT, OSMOLITE 1.5 CAL,) LIQD Place 1,000 mLs into feeding tube continuous. 04/20/19  Yes Sreenath, Sudheer B, MD  nystatin (MYCOSTATIN/NYSTOP) powder Apply 1 application topically 2 (two) times daily. (apply to abdomen and/or under breasts)   Yes [provider]  ondansetron (ZOFRAN-ODT) 4 MG disintegrating tablet Take 1 tablet (4 mg total) by mouth every 8 (eight) hours as needed for nausea or vomiting. appt with PCP further refills Dr. Caryl Bis 01/28/19  Yes McLean-Scocuzza, Nino Glow, MD  pantoprazole sodium (PROTONIX) 40 mg/20 mL PACK Place 20 mLs (40 mg total) into feeding tube 2 (two) times daily. 04/20/19  Yes Sreenath, Sudheer B, MD  senna-docusate (SENOKOT-S) 8.6-50 MG tablet Take 1 tablet by mouth at bedtime. 04/20/19  Yes Sreenath, Sudheer B, MD  sodium zirconium cyclosilicate (LOKELMA) 10 g PACK packet Take 10 g by mouth daily. 04/20/19  Yes Sreenath, Sudheer B, MD  traZODone (DESYREL) 50 MG tablet Take 50 mg by mouth at bedtime. 06/22/19  Yes [provider]  vitamin C (VITAMIN C) 500 MG  tablet Take 1 tablet (500 mg total) by mouth daily. 09/10/18  Yes Hosie Poisson, MD  Water For Irrigation, Sterile (FREE WATER) SOLN Place 20 mLs into feeding tube every 4 (four) hours. 04/20/19  Yes Sreenath, Sudheer B, MD  zinc sulfate 220 (50 Zn) MG capsule Take 1 capsule (220 mg total) by mouth daily. 09/10/18  Yes Hosie Poisson, MD  PACERONE 200 MG tablet Take 200 mg by mouth daily. 06/28/19   [provider]    Allergies Nsaids and Doxycycline    Social History Social History   Tobacco Use  . Smoking status: Never Smoker  . Smokeless tobacco: Never Used  Substance Use Topics  . Alcohol use: No    Alcohol/week: 0.0 standard drinks  . Drug use: No    Review of Systems Patient denies headaches, rhinorrhea, blurry vision, numbness, shortness of breath, chest pain, edema, cough, abdominal pain, nausea, vomiting, diarrhea, dysuria, fevers, rashes or hallucinations unless otherwise stated above in HPI. ____________________________________________   PHYSICAL EXAM:  VITAL SIGNS: Vitals:   06/29/19 1633 06/29/19 1645  BP:    Pulse: (P) 62 (P) 62  Resp:    Temp: (P) 98.2 F (36.8 C)   SpO2:      Constitutional: Alert and oriented.  Eyes: Conjunctivae are normal.  Head: Atraumatic. Nose: No congestion/rhinnorhea. Mouth/Throat: Mucous membranes are moist.   Neck: No stridor. Painless ROM.  Cardiovascular: Normal rate, regular rhythm. Grossly normal heart sounds.  Good peripheral circulation. Respiratory: Normal respiratory effort.  No retractions. Lungs CTAB. Gastrointestinal: Soft with mild epigastric ttp. No guarding or rebound. No distention. No abdominal bruits. No CVA tenderness. Genitourinary:  Musculoskeletal: No lower extremity tenderness nor edema.  No joint effusions. Neurologic:  Normal speech and language. No gross focal neurologic deficits are appreciated. No facial droop Skin:  Skin is warm, dry and intact. No rash noted. Psychiatric: Mood and affect  are normal. Speech and behavior are normal.  ____________________________________________   LABS (all labs ordered are listed, but only abnormal results are displayed)  Results for orders placed or performed during the hospital encounter of 06/29/19 (from the past 24 hour(s))  Basic metabolic panel     Status: Abnormal   Collection Time: 06/29/19 11:56 AM  Result Value Ref Range   Sodium  135 135 - 145 mmol/L   Potassium 5.0 3.5 - 5.1 mmol/L   Chloride 96 (L) 98 - 111 mmol/L   CO2 26 22 - 32 mmol/L   Glucose, Bld 148 (H) 70 - 99 mg/dL   BUN 26 (H) 8 - 23 mg/dL   Creatinine, Ser 6.31 (H) 0.44 - 1.00 mg/dL   Calcium 9.0 8.9 - 10.3 mg/dL   GFR calc non Af Amer 6 (L) >60 mL/min   GFR calc Af Amer 7 (L) >60 mL/min   Anion gap 13 5 - 15  CBC     Status: Abnormal   Collection Time: 06/29/19 11:56 AM  Result Value Ref Range   WBC 6.5 4.0 - 10.5 K/uL   RBC 4.08 3.87 - 5.11 MIL/uL   Hemoglobin 11.8 (L) 12.0 - 15.0 g/dL   HCT 37.6 36.0 - 46.0 %   MCV 92.2 80.0 - 100.0 fL   MCH 28.9 26.0 - 34.0 pg   MCHC 31.4 30.0 - 36.0 g/dL   RDW 18.6 (H) 11.5 - 15.5 %   Platelets 236 150 - 400 K/uL   nRBC 0.3 (H) 0.0 - 0.2 %  Troponin I (High Sensitivity)     Status: None   Collection Time: 06/29/19 11:56 AM  Result Value Ref Range   Troponin I (High Sensitivity) 10 <18 ng/L  Hepatic function panel     Status: Abnormal   Collection Time: 06/29/19 12:23 PM  Result Value Ref Range   Total Protein 7.0 6.5 - 8.1 g/dL   Albumin 2.8 (L) 3.5 - 5.0 g/dL   AST 89 (H) 15 - 41 U/L   ALT 12 0 - 44 U/L   Alkaline Phosphatase 53 38 - 126 U/L   Total Bilirubin 1.5 (H) 0.3 - 1.2 mg/dL   Bilirubin, Direct 0.5 (H) 0.0 - 0.2 mg/dL   Indirect Bilirubin 1.0 (H) 0.3 - 0.9 mg/dL  Lipase, blood     Status: None   Collection Time: 06/29/19 12:23 PM  Result Value Ref Range   Lipase 14 11 - 51 U/L  Troponin I (High Sensitivity)     Status: None   Collection Time: 06/29/19  3:22 PM  Result Value Ref Range    Troponin I (High Sensitivity) 10 <18 ng/L   ____________________________________________  EKG My review and personal interpretation at Time: 12:39   Indication: chest pain  Rate: 75  Rhythm: sinus Axis: norma Other: normal intervals, no stemi ____________________________________________  RADIOLOGY  I personally reviewed all radiographic images ordered to evaluate for the above acute complaints and reviewed radiology reports and findings.  These findings were personally discussed with the patient.  Please see medical record for radiology report.  ____________________________________________   PROCEDURES  Procedure(s) performed:  Procedures    Critical Care performed: no ____________________________________________   INITIAL IMPRESSION / ASSESSMENT AND PLAN / ED COURSE  Pertinent labs & imaging results that were available during my care of the patient were reviewed by me and considered in my medical decision making (see chart for details).   DDX: Enteritis, gastritis, C. difficile, SBO, pneumonia, ACS, esophagitis, gastritis  TINA GRUNER is a 64 y.o. who presents to the ED with symptoms as described above.  Primarily complaining of epigastric pain and chest pain.  She is markedly hypertensive.  Denies dialysis.  Fortunately her potassium is normal.  Will give IV head hypertensive medication as well as IV antiemetic and IV pain medication.  Radiographs to be ordered for the but differential.  Will order CT abdomen.  Clinical Course as of Jun 29 1650  Mon Jun 29, 2019  1408 Patient still with nausea.  No active vomiting at this point but is not been able to take her home meds.  Given her hypertension volume overload concerning for mild anasarca I discussed case with Dr. Juleen China and she has gone without dialysis since Friday.  Does have dialysis scheduled tomorrow however she not tolerating her meds I do feel it is prudent to have her dialyzed today and make sure she is tolerating  p.o. we will proceed from there.  Have ordered urinalysis which is pending.  Will discuss with hospitalist.   [PR]    Clinical Course User Index [PR] Merlyn Lot, MD    The patient was evaluated in Emergency Department today for the symptoms described in the history of present illness. He/she was evaluated in the context of the global COVID-19 pandemic, which necessitated consideration that the patient might be at risk for infection with the SARS-CoV-2 virus that causes COVID-19. Institutional protocols and algorithms that pertain to the evaluation of patients at risk for COVID-19 are in a state of rapid change based on information released by regulatory bodies including the CDC and federal and state organizations. These policies and algorithms were followed during the patient's care in the ED.  As part of my medical decision making, I reviewed the following data within the [facility] notes reviewed and incorporated, Labs reviewed, notes from prior ED visits and [place] Controlled Substance Database   ____________________________________________   FINAL CLINICAL IMPRESSION(S) / ED DIAGNOSES  Final diagnoses:  Intractable vomiting with nausea, unspecified vomiting type  Chest pain, unspecified type  Secondary hypertension  ESRD (end stage renal disease) on dialysis ([place])      NEW MEDICATIONS STARTED DURING THIS VISIT:  Current Discharge Medication List       Note:  This document was prepared using Dragon voice recognition software and may include unintentional dictation errors.    Merlyn Lot, MD 06/29/19 [phone]

## 2019-06-30 ENCOUNTER — Other Ambulatory Visit: Payer: Self-pay

## 2019-06-30 ENCOUNTER — Telehealth: Payer: Self-pay | Admitting: Family Medicine

## 2019-06-30 DIAGNOSIS — Z931 Gastrostomy status: Secondary | ICD-10-CM | POA: Diagnosis not present

## 2019-06-30 DIAGNOSIS — I482 Chronic atrial fibrillation, unspecified: Secondary | ICD-10-CM | POA: Diagnosis not present

## 2019-06-30 DIAGNOSIS — N2581 Secondary hyperparathyroidism of renal origin: Secondary | ICD-10-CM | POA: Diagnosis not present

## 2019-06-30 DIAGNOSIS — R079 Chest pain, unspecified: Secondary | ICD-10-CM | POA: Diagnosis not present

## 2019-06-30 DIAGNOSIS — D631 Anemia in chronic kidney disease: Secondary | ICD-10-CM | POA: Diagnosis not present

## 2019-06-30 DIAGNOSIS — Z7401 Bed confinement status: Secondary | ICD-10-CM | POA: Diagnosis not present

## 2019-06-30 DIAGNOSIS — R110 Nausea: Secondary | ICD-10-CM | POA: Diagnosis not present

## 2019-06-30 DIAGNOSIS — I120 Hypertensive chronic kidney disease with stage 5 chronic kidney disease or end stage renal disease: Secondary | ICD-10-CM | POA: Diagnosis not present

## 2019-06-30 DIAGNOSIS — M2550 Pain in unspecified joint: Secondary | ICD-10-CM | POA: Diagnosis not present

## 2019-06-30 DIAGNOSIS — N186 End stage renal disease: Secondary | ICD-10-CM | POA: Diagnosis not present

## 2019-06-30 DIAGNOSIS — R0789 Other chest pain: Secondary | ICD-10-CM | POA: Diagnosis not present

## 2019-06-30 LAB — MRSA PCR SCREENING: MRSA by PCR: NEGATIVE

## 2019-06-30 LAB — BASIC METABOLIC PANEL
Anion gap: 10 (ref 5–15)
BUN: 13 mg/dL (ref 8–23)
CO2: 34 mmol/L — ABNORMAL HIGH (ref 22–32)
Calcium: 8.4 mg/dL — ABNORMAL LOW (ref 8.9–10.3)
Chloride: 97 mmol/L — ABNORMAL LOW (ref 98–111)
Creatinine, Ser: 4.12 mg/dL — ABNORMAL HIGH (ref 0.44–1.00)
GFR calc Af Amer: 12 mL/min — ABNORMAL LOW (ref >60)
GFR calc non Af Amer: 11 mL/min — ABNORMAL LOW (ref >60)
Glucose, Bld: 112 mg/dL — ABNORMAL HIGH (ref 70–99)
Potassium: 3.1 mmol/L — ABNORMAL LOW (ref 3.5–5.1)
Sodium: 141 mmol/L (ref 135–145)

## 2019-06-30 LAB — LIPID PANEL
Cholesterol: 167 mg/dL (ref 0–200)
HDL: 53 mg/dL (ref >40)
LDL Cholesterol: 92 mg/dL (ref 0–99)
Total CHOL/HDL Ratio: 3.2 RATIO
Triglycerides: 110 mg/dL (ref <150)
VLDL: 22 mg/dL (ref 0–40)

## 2019-06-30 LAB — GLUCOSE, CAPILLARY
Glucose-Capillary: 110 mg/dL — ABNORMAL HIGH (ref 70–99)
Glucose-Capillary: 178 mg/dL — ABNORMAL HIGH (ref 70–99)
Glucose-Capillary: 144 mg/dL — ABNORMAL HIGH (ref 70–99)

## 2019-06-30 LAB — CBC
HCT: 32.8 % — ABNORMAL LOW (ref 36.0–46.0)
Hemoglobin: 10.2 g/dL — ABNORMAL LOW (ref 12.0–15.0)
MCH: 29.6 pg (ref 26.0–34.0)
MCHC: 31.1 g/dL (ref 30.0–36.0)
MCV: 95.1 fL (ref 80.0–100.0)
Platelets: 227 10*3/uL (ref 150–400)
RBC: 3.45 MIL/uL — ABNORMAL LOW (ref 3.87–5.11)
RDW: 19.3 % — ABNORMAL HIGH (ref 11.5–15.5)
WBC: 6.4 10*3/uL (ref 4.0–10.5)
nRBC: 0.3 % — ABNORMAL HIGH (ref 0.0–0.2)

## 2019-06-30 LAB — HEMOGLOBIN A1C
Hgb A1c MFr Bld: 5.2 % (ref 4.8–5.6)
Mean Plasma Glucose: 102.54 mg/dL

## 2019-06-30 MED ORDER — PANTOPRAZOLE SODIUM 40 MG PO TBEC
40.0000 mg | DELAYED_RELEASE_TABLET | Freq: Two times a day (BID) | ORAL | Status: DC
  Administered 2019-06-30: 40 mg via ORAL
  Filled 2019-06-30: qty 1

## 2019-06-30 MED ORDER — NEPRO/CARBSTEADY PO LIQD: 237.0000 mL | Freq: Two times a day (BID) | ORAL | Status: DC

## 2019-06-30 MED ORDER — AMIODARONE HCL 200 MG PO TABS
200.0000 mg | ORAL_TABLET | Freq: Every day | ORAL | Status: DC
  Administered 2019-06-30: 200 mg via ORAL
  Filled 2019-06-30: qty 1

## 2019-06-30 MED ORDER — IPRATROPIUM-ALBUTEROL 0.5-2.5 (3) MG/3ML IN SOLN
3.0000 mL | Freq: Three times a day (TID) | RESPIRATORY_TRACT | Status: DC
  Administered 2019-06-30: 3 mL via RESPIRATORY_TRACT
  Filled 2019-06-30 (×2): qty 3

## 2019-06-30 MED ORDER — FLUOXETINE HCL 20 MG PO CAPS: 20.0000 mg | ORAL_CAPSULE | Freq: Every day | ORAL | 1 refills | Status: DC

## 2019-06-30 NOTE — Consult Note (Signed)
Jonathon Bellows , MD [address] [place], [place], [place], [address] [address], [place], [place], [place], [address] Phone: [phone]  Fax: [phone]  Consultation  Referring Provider:    Dr. Jimmye Norman  primary Care Physician:  [name], MD Primary Gastroenterologist:  Dr. Bonna Gains Reason for Consultation:     G-tube removal  Date of Admission:  06/29/2019 Date of Consultation:  06/30/2019         HPI:   Jamie Arias is a 64 y.o. female underwent a G-tube placement in February 2021 after her prolonged hospitalization being admitted in December 2020.  At that time she had altered mental status.  I change the PEG tube in February 2021.  I have been consulted to remove the PEG tube now as she does not require it as she is eating adequately orally.  Past Medical History:  Diagnosis Date  . Anemia   . Chronic diastolic CHF (congestive heart failure) ([place])    a. Due to ischemic cardiomyopathy. EF as low as 35%, improved to normal s/p CABG; b. echo 07/06/13: EF 55-60%, no RWMAs, mod TR, trivial pericardial effusion not c/w tamponade physiology;  c. 10/2015 Echo: EF 65%, Gr1 DD, triv AI, mild MR, mildly dil LA, mod TR, PASP 38mmHg.  Marland Kitchen Coronary artery disease    a. NSTEMI 06/2013; b.cath: severe three-vessel CAD w/ EF 30% & mild-mod MR; c. s/p 3 vessel CABG 07/02/13 (LIMA-LAD, SVG-OM, and SVG-RPDA);  d. 10/2015 MV: no ischemia/infarct.  . Diabetes mellitus without complication ([place])   . Diabetic neuropathy ([place])   . Dialysis patient [facility])    MWF  . ESRD (end stage renal disease) ([place])    a. 12/2015 initiated - mwf dialysis.  Marland Kitchen GERD (gastroesophageal reflux disease)   . Hyperlipidemia   . Hypertension   . Hypothyroidism   . Myocardial infarction ([place]) 2015  . Neuropathy   . Pleural effusion 2015  . Pulmonary hypertension ([place])   . Renal insufficiency   . Wears dentures    full lower    Past Surgical History:  Procedure Laterality Date  . A/V FISTULAGRAM Right  12/17/2016   Procedure: A/V Fistulagram;  Surgeon: Algernon Huxley, MD;  Location: [place] [facility];  Service: Cardiovascular;  Laterality: Right;  . A/V FISTULAGRAM Right 01/07/2017   Procedure: A/V Fistulagram;  Surgeon: Algernon Huxley, MD;  Location: [place] [facility];  Service: Cardiovascular;  Laterality: Right;  . A/V FISTULAGRAM Right 12/03/2017   Procedure: A/V FISTULAGRAM;  Surgeon: Katha Cabal, MD;  Location: [place] [facility];  Service: Cardiovascular;  Laterality: Right;  . A/V FISTULAGRAM Right 02/23/2019   Procedure: A/V Fistulagram;  Surgeon: Algernon Huxley, MD;  Location: [place] [facility];  Service: Cardiovascular;  Laterality: Right;  . A/V SHUNT INTERVENTION N/A 02/22/2017   Procedure: A/V SHUNT INTERVENTION;  Surgeon: Algernon Huxley, MD;  Location: [place] [facility];  Service: Cardiovascular;  Laterality: N/A;  . AV FISTULA PLACEMENT Right 02/03/2016   Procedure: INSERTION OF ARTERIOVENOUS (AV) GORE-TEX GRAFT ARM ( BRACH / AXILLARY );  Surgeon: Katha Cabal, MD;  Location: [facility];  Service: Vascular;  Laterality: Right;  . [place] REMOVAL Right 03/04/2019   Procedure: REMOVAL OF ARTERIOVENOUS GORETEX GRAFT ([place]);  Surgeon: Algernon Huxley, MD;  Location: [facility];  Service: Vascular;  Laterality: Right;  . CARDIAC CATHETERIZATION    . CATARACT EXTRACTION Bilateral   . CHOLECYSTECTOMY N/A 12/09/2014   Procedure: LAPAROSCOPIC CHOLECYSTECTOMY;  Surgeon: Marlyce [name], MD;  Location: [facility];  Service: General;  Laterality: N/A;  . CORONARY ARTERY BYPASS GRAFT N/A 07/02/2013   Procedure: CORONARY ARTERY BYPASS GRAFTING (CABG);  Surgeon: Ivin [name], MD;  Location: [place];  Service: Open Heart Surgery;  Laterality: N/A;  CABG x three, using left internal mammary artery and right leg greater saphenous vein harvested endoscopically  . ESOPHAGOGASTRODUODENOSCOPY N/A 04/11/2019   Procedure: ESOPHAGOGASTRODUODENOSCOPY (EGD);  Surgeon: Jonathon Bellows, MD;  Location:  [facility];  Service: Gastroenterology;  Laterality: N/A;  . ESOPHAGOGASTRODUODENOSCOPY (EGD) WITH PROPOFOL N/A 11/24/2015   Procedure: ESOPHAGOGASTRODUODENOSCOPY (EGD) WITH PROPOFOL;  Surgeon: [name], MD;  Location: [place];  Service: Endoscopy;  Laterality: N/A;  Diabetic - insulin  . EYE SURGERY Bilateral    Cataract Extraction with IOL  . INTRAOPERATIVE TRANSESOPHAGEAL ECHOCARDIOGRAM N/A 07/02/2013   Procedure: INTRAOPERATIVE TRANSESOPHAGEAL ECHOCARDIOGRAM;  Surgeon: Ivin [name], MD;  Location: [place];  Service: Open Heart Surgery;  Laterality: N/A;  . PEG PLACEMENT N/A 03/03/2019   Procedure: PERCUTANEOUS ENDOSCOPIC GASTROSTOMY (PEG) PLACEMENT;  Surgeon: Virgel Manifold, MD;  Location: [facility];  Service: Endoscopy;  Laterality: N/A;  . PERIPHERAL VASCULAR CATHETERIZATION Right 12/06/2015   Procedure: Dialysis/Perma Catheter Insertion;  Surgeon: Katha Cabal, MD;  Location: [place] [facility];  Service: Cardiovascular;  Laterality: Right;  . PERIPHERAL VASCULAR THROMBECTOMY Right 09/28/2016   Procedure: Peripheral Vascular Thrombectomy;  Surgeon: Katha Cabal, MD;  Location: [place] [facility];  Service: Cardiovascular;  Laterality: Right;  . PERIPHERAL VASCULAR THROMBECTOMY Right 05/20/2018   Procedure: PERIPHERAL VASCULAR THROMBECTOMY;  Surgeon: Katha Cabal, MD;  Location: [place] [facility];  Service: Cardiovascular;  Laterality: Right;  . PORTA CATH REMOVAL N/A 06/01/2016   Procedure: [place]i [name] Cath Removal;  Surgeon: Katha Cabal, MD;  Location: [place] [facility];  Service: Cardiovascular;  Laterality: N/A;  . THORACENTESIS Left 2015    Prior to Admission medications   Medication Sig Start Date End Date Taking? Authorizing Provider  acetaminophen (TYLENOL) 325 MG tablet Take 650 mg by mouth daily as needed for moderate pain or headache.    Yes [provider]  Amino Acids-Protein Hydrolys (FEEDING SUPPLEMENT, PRO-STAT  SUGAR FREE 64,) LIQD Place 30 mLs into feeding tube daily. 04/21/19  Yes Sreenath, Sudheer B, MD  aspirin EC 81 MG tablet Take 81 mg by mouth daily.   Yes [provider]  atorvastatin (LIPITOR) 40 MG tablet Take 40 mg by mouth daily.   Yes [provider]  ELIQUIS 5 MG TABS tablet Take 5 mg by mouth 2 (two) times daily.  01/20/19  Yes [provider]  ferrous gluconate (FERGON) 324 MG tablet Take 1 tablet (324 mg total) by mouth daily. 04/21/19  Yes Sreenath, Sudheer B, MD  fluticasone (FLONASE) 50 MCG/ACT nasal spray Place 2 sprays into both nostrils daily. Patient taking differently: Place 2 sprays into both nostrils daily as needed for allergies or rhinitis.  09/17/17  Yes [name], MD  folic acid (FOLVITE) 1 MG tablet Take 1 tablet (1 mg total) by mouth daily. 04/21/19  Yes Sreenath, Sudheer B, MD  gabapentin (NEURONTIN) 100 MG capsule Take 100 mg by mouth 3 (three) times daily.   Yes [provider]  hydrALAZINE (APRESOLINE) 10 MG tablet Take 1 tablet (10 mg total) by mouth in the morning and at bedtime. 06/23/19  Yes Marrianne Mood D, PA-C  hydrocortisone cream 1 % Apply topically as needed for itching. 04/20/19  Yes Sreenath, Sudheer B, MD  ipratropium-albuterol (DUONEB) 0.5-2.5 (3) MG/3ML SOLN Take 3 mLs by nebulization every 4 (four) hours as needed. 04/20/19  Yes Sreenath, Sudheer B, MD  levothyroxine (SYNTHROID) 88 MCG tablet Take 88 mcg by mouth daily before breakfast.   Yes [provider]  Melatonin 3 MG TABS Take 3 mg by mouth at bedtime as needed (sleep).    Yes [provider]  multivitamin (RENA-VIT) TABS tablet Take 1 tablet by mouth at bedtime. 09/09/18  Yes Hosie Poisson, MD  nitroGLYCERIN (NITROSTAT) 0.4 MG SL tablet DISSOLVE ONE TABLET UNDER THE TONGUE EVERY 5 MINUTES AS NEEDED FOR CHEST PAIN.  DO NOT EXCEED A TOTAL OF 3 DOSES IN 15 MINUTES Patient taking differently: Place 0.4 mg under the tongue every 5 (five) minutes  as needed for chest pain. DISSOLVE ONE TABLET UNDER THE TONGUE EVERY 5 MINUTES AS NEEDED FOR CHEST PAIN.  DO NOT EXCEED A TOTAL OF 3 DOSES IN 15 MINUTES 08/18/18  Yes Wellington Hampshire, MD  Nutritional Supplements (FEEDING SUPPLEMENT, OSMOLITE 1.5 CAL,) LIQD Place 1,000 mLs into feeding tube continuous. 04/20/19  Yes Sreenath, Sudheer B, MD  nystatin (MYCOSTATIN/NYSTOP) powder Apply 1 application topically 2 (two) times daily. (apply to abdomen and/or under breasts)   Yes [provider]  ondansetron (ZOFRAN-ODT) 4 MG disintegrating tablet Take 1 tablet (4 mg total) by mouth every 8 (eight) hours as needed for nausea or vomiting. appt with PCP further refills Dr. Caryl Bis 01/28/19  Yes McLean-Scocuzza, Nino Glow, MD  pantoprazole sodium (PROTONIX) 40 mg/20 mL PACK Place 20 mLs (40 mg total) into feeding tube 2 (two) times daily. 04/20/19  Yes Sreenath, Sudheer B, MD  senna-docusate (SENOKOT-S) 8.6-50 MG tablet Take 1 tablet by mouth at bedtime. 04/20/19  Yes Sreenath, Sudheer B, MD  sodium zirconium cyclosilicate (LOKELMA) 10 g PACK packet Take 10 g by mouth daily. 04/20/19  Yes Sreenath, Sudheer B, MD  traZODone (DESYREL) 50 MG tablet Take 50 mg by mouth at bedtime. 06/22/19  Yes [provider]  vitamin C (VITAMIN C) 500 MG tablet Take 1 tablet (500 mg total) by mouth daily. 09/10/18  Yes Hosie Poisson, MD  Water For Irrigation, Sterile (FREE WATER) SOLN Place 20 mLs into feeding tube every 4 (four) hours. 04/20/19  Yes Sreenath, Sudheer B, MD  zinc sulfate 220 (50 Zn) MG capsule Take 1 capsule (220 mg total) by mouth daily. 09/10/18  Yes Hosie Poisson, MD  FLUoxetine (PROZAC) 20 MG capsule Take 1 capsule (20 mg total) by mouth daily. 06/30/19   [name], MD  PACERONE 200 MG tablet Take 200 mg by mouth daily. 06/28/19   [provider]    Family History  Problem Relation Age of Onset  . COPD Mother   . Cancer Mother        Lung  . Pulmonary embolism Father   . Diabetes  Father   . Diabetes Paternal Grandfather   . Heart disease Maternal Grandmother   . Colon cancer Neg Hx   . Colon polyps Neg Hx   . Esophageal cancer Neg Hx   . Pancreatic cancer Neg Hx   . Liver disease Neg Hx      Social History   Tobacco Use  . Smoking status: Never Smoker  . Smokeless tobacco: Never Used  Substance Use Topics  . Alcohol use: No    Alcohol/week: 0.0 standard drinks  . Drug use: No    Allergies as of 06/29/2019 - Review  Complete 06/29/2019  Allergen Reaction Noted  . Nsaids Other (See Comments) 04/03/2016  . Doxycycline Other (See Comments) 05/14/2017    Review of Systems:    All systems reviewed and negative except where noted in HPI.   Physical Exam:  Vital signs in last 24 hours: Temp:  [98 F (36.7 C)-98.5 F (36.9 C)] 98.5 F (36.9 C) (04/27 1142) Pulse Rate:  [55-71] 66 (04/27 1142) Resp:  [10-24] 18 (04/27 1142) BP: (98-210)/(43-116) 130/49 (04/27 1142) SpO2:  [90 %-100 %] 95 % (04/27 1142) Weight:  [75.8 kg-76.1 kg] 76.1 kg (04/27 0525) Last BM Date: 06/29/19 General:   Pleasant, cooperative in NAD Head:  Normocephalic and atraumatic. Eyes:   No icterus.   Conjunctiva pink. PERRLA. Ears:  Normal auditory acuity. Abdomen:  Soft, nondistended, nontender. Normal bowel sounds. No appreciable masses or hepatomegaly.  No rebound or guarding. G tube in place, site appears healthy- with verbal consent - tube pulled out, no bleeding seen , dressing applied Neurologic:  Alert and oriented x3;  grossly normal neurologically. Psych:  Alert and cooperative. Normal affect.  LAB RESULTS: Recent Labs    06/29/19 1156 06/30/19 0554  WBC 6.5 6.4  HGB 11.8* 10.2*  HCT 37.6 32.8*  PLT 236 227   BMET Recent Labs    06/29/19 1156 06/30/19 0554  NA 135 141  K 5.0 3.1*  CL 96* 97*  CO2 26 34*  GLUCOSE 148* 112*  BUN 26* 13  CREATININE 6.31* 4.12*  CALCIUM 9.0 8.4*   LFT Recent Labs    06/29/19 1223  PROT 7.0  ALBUMIN 2.8*  AST 89*    ALT 12  ALKPHOS 53  BILITOT 1.5*  BILIDIR 0.5*  IBILI 1.0*   PT/INR No results for input(s): LABPROT, INR in the last 72 hours.  STUDIES: CT ABDOMEN PELVIS WO CONTRAST  Result Date: 06/29/2019 CLINICAL DATA:  Nausea, vomiting, diarrhea, chest pain since yesterday EXAM: CT ABDOMEN AND PELVIS WITHOUT CONTRAST TECHNIQUE: Multidetector CT imaging of the abdomen and pelvis was performed following the standard protocol without IV contrast. COMPARISON:  06/29/2019, 10/23/2015 FINDINGS: Lower chest: There are small bilateral pleural effusions. Rounded atelectasis again seen within the left lower lobe. Heart is enlarged without pericardial effusion. Hepatobiliary: No focal liver abnormality is seen. Status post cholecystectomy. No biliary dilatation. Pancreas: Unremarkable. No pancreatic ductal dilatation or surrounding inflammatory changes. Spleen: Normal in size without focal abnormality. Adrenals/Urinary Tract: Kidneys are atrophic. Calcifications of the bilateral hila are felt to be vascular. No definite urinary tract calculi or obstruction. Bladder is moderately distended without filling defect. Mild perivesicular fat stranding is nonspecific, and could reflect cystitis in the appropriate setting. Please correlate with urinalysis. The adrenals are unremarkable. Stomach/Bowel: No bowel obstruction or ileus. Normal appendix right lower quadrant. Percutaneous gastrostomy tube is seen within the gastric lumen. Vascular/Lymphatic: Aortic atherosclerosis. No enlarged abdominal or pelvic lymph nodes. Reproductive: Uterus and bilateral adnexa are unremarkable. Other: There is mild diffuse body wall edema. No free intraperitoneal fluid or free gas. Small fat containing umbilical hernia is unchanged. Musculoskeletal: No acute displaced fractures. Reconstructed images demonstrate no additional findings. IMPRESSION: 1. Small bilateral pleural effusions. 2. Mild perivesicular fat stranding is nonspecific, and could  reflect cystitis in the appropriate setting. Please correlate with urinalysis. 3. Mild diffuse body wall edema. 4. Stable rounded atelectasis left lower lobe. 5. Aortic Atherosclerosis (ICD10-I70.0). Electronically Signed   By: Randa Ngo M.D.   On: 06/29/2019 13:56   DG Chest 2 View  Result Date:  06/29/2019 CLINICAL DATA:  Shortness of breath and chest pain EXAM: CHEST - 2 VIEW COMPARISON:  April 16, 2019 FINDINGS: Central catheter tip is in the superior vena cava. No pneumothorax. There is interstitial edema with small left pleural effusion. No airspace consolidation. There is cardiomegaly with mild pulmonary venous hypertension. Patient is status post coronary artery bypass grafting. No adenopathy. No bone lesions. IMPRESSION: Cardiomegaly with pulmonary vascular congestion. Interstitial edema with left pleural effusion. Appearance is felt to be indicative of a degree of congestive heart failure. No airspace consolidation. Postoperative changes. Central catheter tip in superior vena cava. Electronically Signed   By: Lowella Grip III M.D.   On: 06/29/2019 13:05      Impression / Plan:   JERUSALEM BROWNSTEIN is a 63 y.o. y/o female with NG tube in place since December 2020, replaced in February 2021.  Request to have it taken out at this time.  She is eating adequately orally. G tube taken out at bed side, suggest change dressings dry daily and stop when opening is close.    I will sign off.  Please call me if any further GI concerns or questions.  We would like to thank you for the opportunity to participate in the care of Jamie Arias.   Thank you for involving me in the care of this patient.      LOS: 0 days   Jonathon Bellows, MD  06/30/2019, 2:04 PM

## 2019-06-30 NOTE — Telephone Encounter (Signed)
Pt's husband called in and said the pt is completely out of promethazine and fluoxetine prescriptions. He said [facility] sent over the prescriptions.

## 2019-06-30 NOTE — Evaluation (Signed)
Occupational Therapy Evaluation Patient Details Name: Jamie Arias MRN: [id number] DOB: March 29, 1955 Today's Date: 06/30/2019    History of Present Illness Adrain Butrick is a 64 y/o F well known to [facility] with recent prolonged hospitalization from 02/09/2019-04/20/2019 d/t MRSA sepsis, complicated by toxic metabolic encephalopathy, L UE DVT (12/29), Re'ed intubation (extub 12/31), and clogged G-tube requiring PEG. Other pertinent PMH includes: ESRD on HD, Afib, dCHF, CAD s/p CABG, DM, HTN, and COVID (+) July 2020. Pt presented on this hosptialization d/t chest and epigastric pain. Pt scheduled to have PEG tube removed as she has been tolerating food at home (s/p d/c from [facility] for rehab). Per MD note, chest pain likely d/t demand ischemia, Troponin NOT elevated.   Clinical Impression   Pt was seen for OT evaluation this date. Prior to hospital admission, pt was requiring assist for LB ADLs from husband and assist for slideboard transfers from bed to wheelchair. Pt lives in [facility] with ramped entrance. Currently pt demonstrates impairments as described below (See OT problem list) which functionally limit her ability to perform ADL/self-care tasks. Pt currently requires MIN A with sup to sit transition with MIN A, demos G static sitting balance. Transfer NT on OT assessment as tech presents to perform EKG and in addition, pt's feet contracted in slight plantar flexion (is having this addressed with HHPT per pt).  Pt would benefit from skilled OT to address noted impairments and functional limitations (see below for any additional details) in order to maximize safety and independence while minimizing falls risk and caregiver burden. Upon hospital discharge, recommend HHOT to maximize pt safety and return to functional independence during meaningful occupations of daily life.      Follow Up Recommendations  Home health OT;Supervision/Assistance - 24 hour    Equipment Recommendations  3 in 1 bedside  commode;Tub/shower seat;Hospital bed    Recommendations for Other Services       Precautions / Restrictions Precautions Precautions: Fall Restrictions Weight Bearing Restrictions: No      Mobility Bed Mobility Overal bed mobility: Needs Assistance Bed Mobility: Supine to Sit;Sit to Supine     Supine to sit: Min assist;HOB elevated Sit to supine: Min assist      Transfers                 General transfer comment: NT on OT assessment    Balance Overall balance assessment: Needs assistance Sitting-balance support: Feet supported Sitting balance-Leahy Scale: Good Sitting balance - Comments: intermittent use of UE to stabilize, but primarily able to manage w/o for static sitting       Standing balance comment: NT                           ADL either performed or assessed with clinical judgement   ADL                                         General ADL Comments: Pt requires TOTAL A for LB ADLs in EOB sitting, setup to MIN A with most UB g/h tasks in EOB sitting. MIN A with HOB elevated for sup to sit transition. Demos G static sitting balance for particicapation in ADLs.     Vision Patient Visual Report: No change from baseline       Perception     Praxis  Pertinent Vitals/Pain Pain Assessment: No/denies pain     Hand Dominance Left   Extremity/Trunk Assessment Upper Extremity Assessment Upper Extremity Assessment: Generalized weakness;RUE deficits/detail;LUE deficits/detail(some intermittent UE tremors.) RUE Deficits / Details: shld 3-/5, elbow 3+/5, grip 4-/5. LUE Deficits / Details: dominant side, shld 3-/5, elbow 4-/5, grip 4/5   Lower Extremity Assessment Lower Extremity Assessment: Defer to PT evaluation;Generalized weakness       Communication     Cognition Arousal/Alertness: Awake/alert Behavior During Therapy: WFL for tasks assessed/performed Overall Cognitive Status: Within Functional Limits for  tasks assessed                                 General Comments: Pt appropriate this date. Endorses not remembering much of last hospital stay.   General Comments       Exercises Other Exercises Other Exercises: OT facilitates education re: importance of OOB activity. Pt with good reception and agreeable to sitting up with OT. Tolerates ~11 mins in static sitting EOB during bathing/drying UB (with MOD A) and UB grooming (with setup).   Shoulder Instructions      Home Living Family/patient expects to be discharged to:: Private residence Living Arrangements: Spouse/significant other Available Help at Discharge: Family;Available 24 hours/day(has HHPT 2x/wk, HH RN 1x/wk) Type of Home: Mobile home Home Access: Stairs to enter;Ramped entrance Entrance Stairs-Number of Steps: 8 Entrance Stairs-Rails: Right Home Layout: One level     Bathroom Shower/Tub: Occupational psychologist: Standard     Home Equipment: Environmental consultant - 2 wheels;Wheelchair - [employer];Hospital bed;Shower seat   Additional Comments: Pt's husband reports pt was independent prior to hospitalization for COVID in June 2020; pt had prolonged hospitalization but discharged late July; came home from rehab in mid November (using sit to stand mechanical lift and hospital bed).  Has been on dialysis for 3 years (used a transport chair when going out to HD prior to June 2020).      Prior Functioning/Environment Level of Independence: Needs assistance  Gait / Transfers Assistance Needed: husband has been assisting with slideboard t/f's to wheelchair and propels w/c for patient. Pt sees HHPT 2x/wk and reports attempts to stand with HHPT, but is limited by shortened connective tissue of achilles. ADL's / Homemaking Assistance Needed: Pt requires asssist for most LB ADLs including bathing/dressing, states Husband performs all household IADLs as well including getting groceries and cooking.   Comments:  Pt reports that she recieves transportation from [facility] to get to MD appts and HD.        OT Problem List: Decreased strength;Decreased range of motion;Decreased activity tolerance;Impaired balance (sitting and/or standing);Decreased coordination;Decreased safety awareness      OT Treatment/Interventions: Self-care/ADL training;Therapeutic exercise;Energy conservation;DME and/or AE instruction;Therapeutic activities;Patient/family education;Balance training    OT Goals(Current goals can be found in the care plan section) Acute Rehab OT Goals Patient Stated Goal: to go home OT Goal Formulation: With patient Time For Goal Achievement: 07/14/19 Potential to Achieve Goals: Good  OT Frequency: Min 2X/week   Barriers to D/C:            Co-evaluation              AM-PAC OT "6 Clicks" Daily Activity     Outcome Measure Help from another person eating meals?: A Little Help from another person taking care of personal grooming?: A Little Help from another person toileting, which includes using toliet, bedpan, or urinal?: A  Lot Help from another person bathing (including washing, rinsing, drying)?: A Lot Help from another person to put on and taking off regular upper body clothing?: A Little Help from another person to put on and taking off regular lower body clothing?: Total 6 Click Score: 14   End of Session Nurse Communication: Mobility status  Activity Tolerance: Patient tolerated treatment well Patient left: in bed;with call bell/phone within reach;with nursing/sitter in room;with bed alarm set(nurse tech presenting to perform EKG)  OT Visit Diagnosis: Unsteadiness on feet (R26.81);Muscle weakness (generalized) (M62.81)                Time: 9030-0923 OT Time Calculation (min): 42 min Charges:  OT General Charges $OT Visit: 1 Visit OT Evaluation $OT Eval Moderate Complexity: 1 Mod OT Treatments $Self Care/Home Management : 8-22 mins $Therapeutic Activity: 8-22  mins  Gerrianne Scale, MS, OTR/L ascom [phone] 06/30/19, 2:57 PM

## 2019-06-30 NOTE — Telephone Encounter (Signed)
Sent to provider.  Samuell Knoble,cma

## 2019-06-30 NOTE — Discharge Summary (Signed)
Physician Discharge Summary  Jamie Arias JKD:[id number] DOB: 11-14-1955 DOA: 06/29/2019  PCP: [name], MD  Admit date: 06/29/2019 Discharge date: 06/30/2019  Admitted From: home  Disposition: home w/ resumption of home health   Recommendations for Outpatient Follow-up:  1. Follow up with PCP in 1 week 2. F/u nephro (Dr. Juleen China) in 1 week   Home Health: yes, to resume home health  Equipment/Devices:  Discharge Condition: stable  CODE STATUS: full  Diet recommendation: Heart Healthy / Carb Modified / renal diet/fluid restriction (1269mL)  Brief/Interim Summary: HPI was taken from Dr. Blaine Hamper: Jamie Arias is a 64 y.o. female with medical history significant of hypertension, hyperlipidemia, diabetes mellitus, GERD, hypothyroidism, depression, CAD, CABG, ESRD-HD (MWF), dCHF, iron deficiency anemia, atrial fibrillation and left arm DVT on Eliquis, feeding tube placement, who presents with chest pain, nausea, vomiting, diarrhea.  Patient states that she has been having nausea vomiting and diarrhea since yesterday.  She has had at least 7 times of nonbilious nonbloody vomiting and 5 times of watery diarrhea in the past 24 hours.  No abdominal pain.  No fever or chills.  Patient states that she could not take her oral medications due to severe nausea and vomiting.  She developed chest pain since last night, which is located in the substernal area, dull, nonradiating, initially 8 out of 10 in severity, currently chest pain-free.  No shortness of breath.  Patient is still making little urine, but no dysuria or burning on urination.  No unilateral weakness.  Patient has elevated blood pressure 222/98, which improved to 161/75 after treated with 10 mg of labetalol in the ED.  Patient missed dialysis today.  ED Course: pt was found to have troponin 10, negative Covid PCR, potassium of 5.0, bicarbonate of 26, creatinine 6.31, BUN 26, temperature normal, heart rate 71, oxygen saturation 98%  on room air, RR 16, chest x-ray showed cardiomegaly, vascular congestion and interstitial edema and left pleural effusion.  Patient is placed on progressive bed for observation  # CT of abdomen/pelvis: 1. Small bilateral pleural effusions. 2. Mild perivesicular fat stranding is nonspecific, and could reflect cystitis in the appropriate setting.3. Mild diffuse body wall edema. 4. Stable rounded atelectasis left lower lobe. 5. Aortic Atherosclerosis (ICD10-I70.  Hospital Course from Dr. Lenise Herald 06/30/19: Pt presented w/ chest pain which thought to be secondary to demand ischemia. Troponins were not elevated at all. Of note, pt did receive HD on the night of admission for volume overload as per nephro. Also, pt was scheduled to have her PEG tube taken out as an outpatient on day of admission but unable to do so secondary to above and below stated problems. GI was consulted and removed the pt's PEG tube. Pt tolerated HD relatively well. PT/OT saw the pt and recommended SNF but pt refused and wanted to resume previous home health. Home health orders were placed. CM confired w/ [facility] that the pt could resume with their services.    Discharge Diagnoses:  Principal Problem:   Chest pain Active Problems:   Hypothyroid   HTN (hypertension)   HLD (hyperlipidemia)   Type 2 diabetes mellitus with ESRD (end-stage renal disease) (HCC)   CAD (coronary artery disease)   Atrial fibrillation, chronic (HCC)   Depression   Chronic diastolic CHF (congestive heart failure) (HCC)   ESRD (end stage renal disease) (HCC)   Arm DVT (deep venous thromboembolism), acute, left (HCC)   Nausea vomiting and diarrhea   Hypertensive urgency  Chest  pain and CAD: most likely due to demand ischemia. Troponins are NOT elevated. HbA1c is 5.2. Continue on aspirin and statin. Morphine & nitro prn. Continue on tele  Hypertension and hypertensive urgency: continue on home dose of hydralazine.  IV hydralazine prn    Hypothyroid: continue on home dose of synthroid  HLD: continue on statin   DM2: diet controlled. HbA1c is 5.2, well controlled.    ESRD: on HD. Management per nephro. Nephro recs apprec  Hypokalemia: management w/ HD as per nephro   Chronic atrial fibrillation: continue on amiodarone & eliquis.   Depression: no SI or HI. Continue on home dose of fluoxetine. Severity unknown.  Chronic diastolic CHF: echo on 88/06/1658 showed EF of 60-65% with grade 2 diastolic dysfunction. Volume management w/ HD  Acute LUE DVT: continue on eliquis  Nausea vomiting and diarrhea: CT scan of abdomen/pelvis shows no acute etiology of nausea/vomiting/diarrhea. C. difficile PCR and GI pathogen panel are pending. Zofran prn for nausea.  PEG tube was removed by GI today as pt was scheduled outpatient to have it removed yesterday. Resolved.   Discharge Instructions  Discharge Instructions    Diet - low sodium heart healthy   Complete by: As directed    Fluid restriction   Diet general   Complete by: As directed    Discharge instructions   Complete by: As directed    F/u PCP in 1 week; F/u nephro (Dr. Juleen China) in 1 week   Increase activity slowly   Complete by: As directed      Allergies as of 06/30/2019      Reactions   Nsaids Other (See Comments)   Contraindicated due to kidney disease.   Doxycycline Other (See Comments)   tremor      Medication List    STOP taking these medications   feeding supplement (OSMOLITE 1.5 CAL) Liqd   feeding supplement (PRO-STAT SUGAR FREE 64) Liqd   free water Soln     TAKE these medications   acetaminophen 325 MG tablet Commonly known as: TYLENOL Take 650 mg by mouth daily as needed for moderate pain or headache.   ascorbic acid 500 MG tablet Commonly known as: VITAMIN C Take 1 tablet (500 mg total) by mouth daily.   aspirin EC 81 MG tablet Take 81 mg by mouth daily.   atorvastatin 40 MG tablet Commonly known as: LIPITOR Take 40 mg by  mouth daily.   Eliquis 5 MG Tabs tablet Generic drug: apixaban Take 5 mg by mouth 2 (two) times daily.   ferrous gluconate 324 MG tablet Commonly known as: FERGON Take 1 tablet (324 mg total) by mouth daily.   FLUoxetine 20 MG capsule Commonly known as: PROZAC Take 1 capsule (20 mg total) by mouth daily.   fluticasone 50 MCG/ACT nasal spray Commonly known as: FLONASE Place 2 sprays into both nostrils daily. What changed:   when to take this  reasons to take this   folic acid 1 MG tablet Commonly known as: FOLVITE Take 1 tablet (1 mg total) by mouth daily.   gabapentin 100 MG capsule Commonly known as: NEURONTIN Take 100 mg by mouth 3 (three) times daily.   hydrALAZINE 10 MG tablet Commonly known as: APRESOLINE Take 1 tablet (10 mg total) by mouth in the morning and at bedtime.   hydrocortisone cream 1 % Apply topically as needed for itching.   ipratropium-albuterol 0.5-2.5 (3) MG/3ML Soln Commonly known as: DUONEB Take 3 mLs by nebulization every 4 (four) hours as  needed.   levothyroxine 88 MCG tablet Commonly known as: SYNTHROID Take 88 mcg by mouth daily before breakfast.   melatonin 3 MG Tabs tablet Take 3 mg by mouth at bedtime as needed (sleep).   multivitamin Tabs tablet Take 1 tablet by mouth at bedtime.   nitroGLYCERIN 0.4 MG SL tablet Commonly known as: NITROSTAT DISSOLVE ONE TABLET UNDER THE TONGUE EVERY 5 MINUTES AS NEEDED FOR CHEST PAIN.  DO NOT EXCEED A TOTAL OF 3 DOSES IN 15 MINUTES What changed: See the new instructions.   nystatin powder Commonly known as: MYCOSTATIN/NYSTOP Apply 1 application topically 2 (two) times daily. (apply to abdomen and/or under breasts)   ondansetron 4 MG disintegrating tablet Commonly known as: ZOFRAN-ODT Take 1 tablet (4 mg total) by mouth every 8 (eight) hours as needed for nausea or vomiting. appt with PCP further refills Dr. Caryl Bis   Pacerone 200 MG tablet Generic drug: amiodarone Take 200 mg by  mouth daily.   pantoprazole sodium 40 mg/20 mL Pack Commonly known as: PROTONIX Place 20 mLs (40 mg total) into feeding tube 2 (two) times daily.   senna-docusate 8.6-50 MG tablet Commonly known as: Senokot-S Take 1 tablet by mouth at bedtime.   sodium zirconium cyclosilicate 10 g Pack packet Commonly known as: LOKELMA Take 10 g by mouth daily.   traZODone 50 MG tablet Commonly known as: DESYREL Take 50 mg by mouth at bedtime.   zinc sulfate 220 (50 Zn) MG capsule Take 1 capsule (220 mg total) by mouth daily.       Allergies  Allergen Reactions  . Nsaids Other (See Comments)    Contraindicated due to kidney disease.  Marland Kitchen Doxycycline Other (See Comments)    tremor    Consultations:  Nephro, Dr. Juleen China  GI, Dr. Vicente Males   Procedures/Studies: CT ABDOMEN PELVIS WO CONTRAST  Result Date: 06/29/2019 CLINICAL DATA:  Nausea, vomiting, diarrhea, chest pain since yesterday EXAM: CT ABDOMEN AND PELVIS WITHOUT CONTRAST TECHNIQUE: Multidetector CT imaging of the abdomen and pelvis was performed following the standard protocol without IV contrast. COMPARISON:  06/29/2019, 10/23/2015 FINDINGS: Lower chest: There are small bilateral pleural effusions. Rounded atelectasis again seen within the left lower lobe. Heart is enlarged without pericardial effusion. Hepatobiliary: No focal liver abnormality is seen. Status post cholecystectomy. No biliary dilatation. Pancreas: Unremarkable. No pancreatic ductal dilatation or surrounding inflammatory changes. Spleen: Normal in size without focal abnormality. Adrenals/Urinary Tract: Kidneys are atrophic. Calcifications of the bilateral hila are felt to be vascular. No definite urinary tract calculi or obstruction. Bladder is moderately distended without filling defect. Mild perivesicular fat stranding is nonspecific, and could reflect cystitis in the appropriate setting. Please correlate with urinalysis. The adrenals are unremarkable. Stomach/Bowel: No  bowel obstruction or ileus. Normal appendix right lower quadrant. Percutaneous gastrostomy tube is seen within the gastric lumen. Vascular/Lymphatic: Aortic atherosclerosis. No enlarged abdominal or pelvic lymph nodes. Reproductive: Uterus and bilateral adnexa are unremarkable. Other: There is mild diffuse body wall edema. No free intraperitoneal fluid or free gas. Small fat containing umbilical hernia is unchanged. Musculoskeletal: No acute displaced fractures. Reconstructed images demonstrate no additional findings. IMPRESSION: 1. Small bilateral pleural effusions. 2. Mild perivesicular fat stranding is nonspecific, and could reflect cystitis in the appropriate setting. Please correlate with urinalysis. 3. Mild diffuse body wall edema. 4. Stable rounded atelectasis left lower lobe. 5. Aortic Atherosclerosis (ICD10-I70.0). Electronically Signed   By: Randa Ngo M.D.   On: 06/29/2019 13:56   DG Chest 2 View  Result Date: 06/29/2019  CLINICAL DATA:  Shortness of breath and chest pain EXAM: CHEST - 2 VIEW COMPARISON:  April 16, 2019 FINDINGS: Central catheter tip is in the superior vena cava. No pneumothorax. There is interstitial edema with small left pleural effusion. No airspace consolidation. There is cardiomegaly with mild pulmonary venous hypertension. Patient is status post coronary artery bypass grafting. No adenopathy. No bone lesions. IMPRESSION: Cardiomegaly with pulmonary vascular congestion. Interstitial edema with left pleural effusion. Appearance is felt to be indicative of a degree of congestive heart failure. No airspace consolidation. Postoperative changes. Central catheter tip in superior vena cava. Electronically Signed   By: Lowella Grip III M.D.   On: 06/29/2019 13:05       Subjective: Pt c/o fatigue    Discharge Exam: Vitals:   06/30/19 0810 06/30/19 1142  BP: (!) 119/43 (!) 130/49  Pulse: 65 66  Resp: 18 18  Temp: 98.3 F (36.8 C) 98.5 F (36.9 C)  SpO2: 95% 95%    Vitals:   06/30/19 0244 06/30/19 0525 06/30/19 0810 06/30/19 1142  BP:  (!) 121/58 (!) 119/43 (!) 130/49  Pulse:  63 65 66  Resp:   18 18  Temp:  98 F (36.7 C) 98.3 F (36.8 C) 98.5 F (36.9 C)  TempSrc:  Oral Oral Oral  SpO2: 95% 96% 95% 95%  Weight:  76.1 kg    Height:        General: Pt is alert, awake, not in acute distress Cardiovascular:  S1/S2 +, no rubs, no gallops Respiratory: decreased breath sounds b/l otherwise clear  Abdominal: Soft, NT, ND, bowel sounds + Extremities:  no cyanosis    The results of significant diagnostics from this hospitalization (including imaging, microbiology, ancillary and laboratory) are listed below for reference.     Microbiology: Recent Results (from the past 240 hour(s))  Respiratory Panel by RT PCR (Flu A&B, Covid) - Nasopharyngeal Swab     Status: None   Collection Time: 06/29/19  3:39 PM   Specimen: Nasopharyngeal Swab  Result Value Ref Range Status   SARS Coronavirus 2 by RT PCR NEGATIVE NEGATIVE Final    Comment: (NOTE) SARS-CoV-2 target nucleic acids are NOT DETECTED. The SARS-CoV-2 RNA is generally detectable in upper respiratoy specimens during the acute phase of infection. The lowest concentration of SARS-CoV-2 viral copies this assay can detect is 131 copies/mL. A negative result does not preclude SARS-Cov-2 infection and should not be used as the sole basis for treatment or other patient management decisions. A negative result may occur with  improper specimen collection/handling, submission of specimen other than nasopharyngeal swab, presence of viral mutation(s) within the areas targeted by this assay, and inadequate number of viral copies (<131 copies/mL). A negative result must be combined with clinical observations, patient history, and epidemiological information. The expected result is Negative. Fact Sheet for Patients:  PinkCheek.be Fact Sheet for Healthcare Providers:   GravelBags.it This test is not yet ap proved or cleared by the [place] FDA and  has been authorized for detection and/or diagnosis of SARS-CoV-2 by FDA under an Emergency Use Authorization (EUA). This EUA will remain  in effect (meaning this test can be used) for the duration of the COVID-19 declaration under Section 564(b)(1) of the Act, 21 U.S.C. section 360bbb-3(b)(1), unless the authorization is terminated or revoked sooner.    Influenza A by PCR NEGATIVE NEGATIVE Final   Influenza B by PCR NEGATIVE NEGATIVE Final    Comment: (NOTE) The Xpert Xpress SARS-CoV-2/FLU/RSV assay is intended as  an aid in  the diagnosis of influenza from Nasopharyngeal swab specimens and  should not be used as a sole basis for treatment. Nasal washings and  aspirates are unacceptable for Xpert Xpress SARS-CoV-2/FLU/RSV  testing. Fact Sheet for Patients: PinkCheek.be Fact Sheet for Healthcare Providers: GravelBags.it This test is not yet approved or cleared by the [place] FDA and  has been authorized for detection and/or diagnosis of SARS-CoV-2 by  FDA under an Emergency Use Authorization (EUA). This EUA will remain  in effect (meaning this test can be used) for the duration of the  Covid-19 declaration under Section 564(b)(1) of the Act, 21  U.S.C. section 360bbb-3(b)(1), unless the authorization is  terminated or revoked. Performed at [facility], [address] [place], [place] [address]   MRSA PCR Screening     Status: None   Collection Time: 06/30/19 12:33 AM   Specimen: Nasal Mucosa; Nasopharyngeal  Result Value Ref Range Status   MRSA by PCR NEGATIVE NEGATIVE Final    Comment:        The GeneXpert MRSA Assay (FDA approved for NASAL specimens only), is one component of a comprehensive MRSA colonization surveillance program. It is not intended to diagnose MRSA infection nor to  guide or monitor treatment for MRSA infections. Performed at [facility], [address] [place], [place] [address]      Labs: BNP (last 3 results) Recent Labs    03/01/19 0429  BNP 016.0*   Basic Metabolic Panel: Recent Labs  Lab 06/23/19 1600 06/29/19 1156 06/30/19 0554  NA 138 135 141  K 4.9 5.0 3.1*  CL 97 96* 97*  CO2 28 26 34*  GLUCOSE 185* 148* 112*  BUN 21 26* 13  CREATININE 4.30* 6.31* 4.12*  CALCIUM 8.4* 9.0 8.4*   Liver Function Tests: Recent Labs  Lab 06/29/19 1223  AST 89*  ALT 12  ALKPHOS 53  BILITOT 1.5*  PROT 7.0  ALBUMIN 2.8*   Recent Labs  Lab 06/29/19 1223  LIPASE 14   No results for input(s): AMMONIA in the last 168 hours. CBC: Recent Labs  Lab 06/23/19 1600 06/29/19 1156 06/30/19 0554  WBC 5.3 6.5 6.4  HGB 10.8* 11.8* 10.2*  HCT 35.9 37.6 32.8*  MCV 95 92.2 95.1  PLT 194 236 227   Cardiac Enzymes: No results for input(s): CKTOTAL, CKMB, CKMBINDEX, TROPONINI in the last 168 hours. BNP: Invalid input(s): POCBNP CBG: Recent Labs  Lab 06/30/19 0739 06/30/19 1140  GLUCAP 110* 178*   D-Dimer No results for input(s): DDIMER in the last 72 hours. Hgb A1c Recent Labs    06/30/19 0554  HGBA1C 5.2   Lipid Profile Recent Labs    06/30/19 0554  CHOL 167  HDL 53  LDLCALC 92  TRIG 110  CHOLHDL 3.2   Thyroid function studies No results for input(s): TSH, T4TOTAL, T3FREE, THYROIDAB in the last 72 hours.  Invalid input(s): FREET3 Anemia work up No results for input(s): VITAMINB12, FOLATE, FERRITIN, TIBC, IRON, RETICCTPCT in the last 72 hours. Urinalysis    Component Value Date/Time   COLORURINE YELLOW (A) 08/23/2018 1209   APPEARANCEUR CLOUDY (A) 08/23/2018 1209   APPEARANCEUR Hazy 07/23/2013 0741   LABSPEC 1.016 08/23/2018 1209   LABSPEC 1.016 07/23/2013 0741   PHURINE 5.0 08/23/2018 1209   GLUCOSEU NEGATIVE 08/23/2018 1209   GLUCOSEU Negative 07/23/2013 0741   HGBUR SMALL (A) 08/23/2018 1209    BILIRUBINUR NEGATIVE 08/23/2018 1209   BILIRUBINUR Negative 07/23/2013 0741   Benjamin Stain  NEGATIVE 08/23/2018 1209   PROTEINUR 100 (A) 08/23/2018 1209   UROBILINOGEN 0.2 06/30/2013 2213   NITRITE NEGATIVE 08/23/2018 1209   LEUKOCYTESUR SMALL (A) 08/23/2018 1209   LEUKOCYTESUR Negative 07/23/2013 0741   Sepsis Labs Invalid input(s): PROCALCITONIN,  WBC,  LACTICIDVEN Microbiology Recent Results (from the past 240 hour(s))  Respiratory Panel by RT PCR (Flu A&B, Covid) - Nasopharyngeal Swab     Status: None   Collection Time: 06/29/19  3:39 PM   Specimen: Nasopharyngeal Swab  Result Value Ref Range Status   SARS Coronavirus 2 by RT PCR NEGATIVE NEGATIVE Final    Comment: (NOTE) SARS-CoV-2 target nucleic acids are NOT DETECTED. The SARS-CoV-2 RNA is generally detectable in upper respiratoy specimens during the acute phase of infection. The lowest concentration of SARS-CoV-2 viral copies this assay can detect is 131 copies/mL. A negative result does not preclude SARS-Cov-2 infection and should not be used as the sole basis for treatment or other patient management decisions. A negative result may occur with  improper specimen collection/handling, submission of specimen other than nasopharyngeal swab, presence of viral mutation(s) within the areas targeted by this assay, and inadequate number of viral copies (<131 copies/mL). A negative result must be combined with clinical observations, patient history, and epidemiological information. The expected result is Negative. Fact Sheet for Patients:  PinkCheek.be Fact Sheet for Healthcare Providers:  GravelBags.it This test is not yet ap proved or cleared by the [place] FDA and  has been authorized for detection and/or diagnosis of SARS-CoV-2 by FDA under an Emergency Use Authorization (EUA). This EUA will remain  in effect (meaning this test can be used) for the duration of  the COVID-19 declaration under Section 564(b)(1) of the Act, 21 U.S.C. section 360bbb-3(b)(1), unless the authorization is terminated or revoked sooner.    Influenza A by PCR NEGATIVE NEGATIVE Final   Influenza B by PCR NEGATIVE NEGATIVE Final    Comment: (NOTE) The Xpert Xpress SARS-CoV-2/FLU/RSV assay is intended as an aid in  the diagnosis of influenza from Nasopharyngeal swab specimens and  should not be used as a sole basis for treatment. Nasal washings and  aspirates are unacceptable for Xpert Xpress SARS-CoV-2/FLU/RSV  testing. Fact Sheet for Patients: PinkCheek.be Fact Sheet for Healthcare Providers: GravelBags.it This test is not yet approved or cleared by the [place] FDA and  has been authorized for detection and/or diagnosis of SARS-CoV-2 by  FDA under an Emergency Use Authorization (EUA). This EUA will remain  in effect (meaning this test can be used) for the duration of the  Covid-19 declaration under Section 564(b)(1) of the Act, 21  U.S.C. section 360bbb-3(b)(1), unless the authorization is  terminated or revoked. Performed at [facility], [address] [place], [place] [address]   MRSA PCR Screening     Status: None   Collection Time: 06/30/19 12:33 AM   Specimen: Nasal Mucosa; Nasopharyngeal  Result Value Ref Range Status   MRSA by PCR NEGATIVE NEGATIVE Final    Comment:        The GeneXpert MRSA Assay (FDA approved for NASAL specimens only), is one component of a comprehensive MRSA colonization surveillance program. It is not intended to diagnose MRSA infection nor to guide or monitor treatment for MRSA infections. Performed at [facility], [address], [place], [place] [address]      Time coordinating discharge: Over 30 minutes  SIGNED:   Wyvonnia Dusky, MD  [facility] 06/30/2019, 3:51 PM Pager   If  7PM-7AM, please contact  night-coverage [email or web address]

## 2019-06-30 NOTE — TOC Initial Note (Signed)
Transition of Care [facility]) - Initial/Assessment Note    Patient Details  Name: Jamie Arias MRN: [id number] Date of Birth: 07/12/1955  Transition of Care [facility]) CM/SW Contact:    Eileen Stanford, LCSW Phone Number: 06/30/2019, 2:57 PM  Clinical Narrative:   CSW spoke with pt. Pt's spouse present at bedside. Pt is alert and oriented. Pt states she is active with [facility]. Pt is agreeable to continue to utilize their services. Pt states she has a wheelchair and a 3n1 at home. CSW spoke with Malachy Mood with [facility] will resume services.                Expected Discharge Plan: [place] Barriers to Discharge: No Barriers Identified   Patient Goals and CMS Choice Patient states their goals for this hospitalization and ongoing recovery are:: to go home   Choice offered to / list presented to : Patient  Expected Discharge Plan and Services Expected Discharge Plan: [place] In-house Referral: Clinical Social Work   Post Acute Care Choice: [place] arrangements for the past 2 months: [place]: PT, RN [place] Agency: [place] Date [place]: 06/30/19 Time [place]: 1455 Representative spoke with at [place]: [place] Arrangements/Services Living arrangements for the past 2 months: [place] with:: Spouse Patient language and need for interpreter reviewed:: Yes Do you feel safe going back to the place where you live?: Yes      Need for Family Participation in Patient Care: Yes (Comment) Care giver support system in place?: Yes (comment) Current home services: Home PT, Home RN Criminal Activity/Legal Involvement Pertinent to Current Situation/Hospitalization: No - Comment as needed  Activities of Daily [place] Devices/Equipment: Bedside commode/3-in-1, CBG Meter, Hospital bed, Transfer board, Wheelchair,  Environmental consultant (specify type) ADL Screening (condition at time of admission) Patient's cognitive ability adequate to safely complete daily activities?: Yes Is the patient deaf or have difficulty hearing?: No Does the patient have difficulty seeing, even when wearing glasses/contacts?: No Does the patient have difficulty concentrating, remembering, or making decisions?: No Patient able to express need for assistance with ADLs?: Yes Does the patient have difficulty dressing or bathing?: Yes Independently performs ADLs?: No Communication: Independent Dressing (OT): Needs assistance Is this a change from baseline?: Pre-admission baseline Grooming: Needs assistance Is this a change from baseline?: Pre-admission baseline Feeding: Independent Bathing: Needs assistance Is this a change from baseline?: Pre-admission baseline Toileting: Dependent Is this a change from baseline?: Pre-admission baseline In/Out Bed: Dependent Is this a change from baseline?: Pre-admission baseline Walks in Home: Dependent Is this a change from baseline?: Pre-admission baseline Does the patient have difficulty walking or climbing stairs?: Yes Weakness of Legs: Both Weakness of Arms/Hands: None  Permission Sought/Granted Permission sought to share information with : Family Supports Permission granted to share information with : Yes, Verbal Permission Granted  Share Information with NAME: [name]  Permission granted to share info w AGENCY: [facility]  Permission granted to share info w Relationship: Spouse     Emotional Assessment Appearance:: Appears stated age Attitude/Demeanor/Rapport: Engaged Affect (typically observed): Accepting, Appropriate, Calm Orientation: : Oriented to Self, Oriented to Place, Oriented to  Time, Oriented to Situation Alcohol / Substance Use: Not Applicable Psych Involvement: No (comment)  Admission  diagnosis:  ESRD (end stage renal disease) on dialysis ([place]) [N18.6, Z99.2] Secondary  hypertension [I15.9] Chest pain [R07.9] Chest pain, unspecified type [R07.9] Intractable vomiting with nausea, unspecified vomiting type [R11.2] Patient Active Problem List   Diagnosis Date Noted  . Chest pain 06/29/2019  . Hypertensive urgency 06/29/2019  . ESRD (end stage renal disease) on dialysis ([place])   . Palliative care by specialist   . Goals of care, counseling/discussion   . Abscess   . AF (paroxysmal atrial fibrillation) ([place])   . Nausea vomiting and diarrhea   . Oropharyngeal dysphagia   . Acute metabolic encephalopathy   . Acute blood loss anemia   . Arm DVT (deep venous thromboembolism), acute, left ([place]) 02/13/2019  . Acute encephalopathy 02/13/2019  . Decubitus ulcer of heel, bilateral, stage 2 ([place]) 02/10/2019  . Sepsis due to methicillin resistant Staphylococcus aureus (MRSA) ([place]) 02/10/2019  . MRSA bacteremia 02/10/2019  . Altered level of consciousness 02/09/2019  . Adjustment disorder with depressed mood 08/31/2018  . COVID-19 virus infection 08/24/2018  . Acute on chronic respiratory failure with hypoxia ([place]) 08/24/2018  . UTI (urinary tract infection) 08/24/2018  . Allergic rhinitis 09/19/2017  . Uremia of renal origin 09/04/2017  . Chronic neck pain 07/25/2017  . Hyperkalemia 02/15/2017  . Complication from renal dialysis device 12/01/2016  . ESRD (end stage renal disease) ([place]) 01/19/2016  . GI bleed 10/25/2015  . Hypertensive heart disease 10/24/2015  . Chronic diastolic CHF (congestive heart failure) ([place]) 10/24/2015  . Unstable angina ([place]) 10/23/2015  . Hypotension 09/02/2015  . Chronic systolic CHF (congestive heart failure) ([place]) 09/02/2015  . Depression 07/27/2015  . Calculus of gallbladder with chronic cholecystitis without obstruction   . Bilateral carotid bruits 11/30/2014  . Low magnesium levels 08/10/2013  . Anemia 08/10/2013  . Constipation 08/10/2013  . Atrial fibrillation, chronic ([place]) 07/30/2013  . Coronary artery disease   .  CAD (coronary artery disease) 07/02/2013  . Acute systolic heart failure ([place]) 06/30/2013  . NSTEMI (non-ST elevated myocardial infarction) ([place]) 06/29/2013  . Vertigo 08/25/2012  . Sleep disorder 05/23/2012  . Hypothyroid 04/27/2012  . HTN (hypertension) 04/27/2012  . HLD (hyperlipidemia) 04/27/2012  . Type 2 diabetes mellitus with ESRD (end-stage renal disease) ([place]) 04/27/2012  . Neuropathy 04/27/2012   PCP:  [name], MD Pharmacy:   [facility] [address], [place] - [place] [address] [place] [address] Phone: [phone] Fax: [phone]  [facility] [place], [place] - [address] [place] [address] Phone: [phone] Fax: [phone]     Social Determinants of Health (SDOH) Interventions    Readmission Risk Interventions Readmission Risk Prevention Plan 02/13/2019 09/15/2018  Transportation Screening - Complete  Medication Review ([place]) Complete Complete  PCP or Specialist appointment within 3-5 days of discharge - Complete  HRI or [place] Complete Complete  SW Recovery Care/Counseling Consult Complete Complete  [place] Not Applicable Complete  Some recent data might be hidden

## 2019-06-30 NOTE — Evaluation (Signed)
Physical Therapy Evaluation Patient Details Name: Jamie Arias MRN: [id number] DOB: Mar 10, 1955 Today's Date: 06/30/2019   History of Present Illness  Jamie Arias is a 64 y/o F well known to [facility] with recent prolonged hospitalization from 02/09/2019-04/20/2019 d/t MRSA sepsis, complicated by toxic metabolic encephalopathy, L UE DVT (12/29), Re'ed intubation (extub 12/31), and clogged G-tube requiring PEG. Other pertinent PMH includes: ESRD on HD, Afib, dCHF, CAD s/p CABG, DM, HTN, and COVID (+) July 2020. Pt presented on this hosptialization d/t chest and epigastric pain. Pt scheduled to have PEG tube removed as she has been tolerating food at home (s/p d/c from [facility] for re[facility]). Per MD note, chest pain likely d/t demand ischemia, Troponin NOT elevated.  Clinical Impression  Upon evaluation, patient alert and oriented; follows commands and engages in full conversation with therapist.  Generally weak and deconditioned, LE > UE, but markedly improved from previous admission.  Currently requiring mod assist for bed mobility; close sup for unsupported sitting balance (limited functional reach evident); max assist for lateral scooting edge of bed.  Does require significant assist for trunk control, lift off and lateral movement with scooting efforts; difficulty with co-contraction of bilat quads/hams in closed chain position. Would benefit from skilled PT to address above deficits and promote optimal return to PLOF.; Recommend transition to HHPT upon discharge from acute hospitalization.     Follow Up Recommendations Home health PT    Equipment Recommendations       Recommendations for Other Services       Precautions / Restrictions Precautions Precautions: Fall Restrictions Weight Bearing Restrictions: No      Mobility  Bed Mobility Overal bed mobility: Needs Assistance Bed Mobility: Supine to Sit     Supine to sit: Mod assist Sit to supine: Mod assist      Transfers                  General transfer comment: Deferred  Ambulation/Gait             General Gait Details: deferred; non-ambulatory prior to admission  Stairs            Wheelchair Mobility    Modified Rankin (Stroke Patients Only)       Balance Overall balance assessment: Needs assistance Sitting-balance support: No upper extremity supported;Feet supported Sitting balance-Leahy Scale: Good Sitting balance - Comments: close sup for static and mild dynamic sitting balance; does require contralateral UE support to stabilize with functional reach beyond 1-2" from immediate BOS       Standing balance comment: NT                             Pertinent Vitals/Pain Pain Assessment: No/denies pain    Home Living Family/patient expects to be discharged to:: Private residence Living Arrangements: Spouse/significant other Available Help at Discharge: Family;Available 24 hours/day Type of Home: Mobile home Home Access: Stairs to enter;Ramped entrance   Entrance Stairs-Number of Steps: 8 Home Layout: One level Home Equipment: Walker - 2 wheels;Wheelchair - [employer];Hospital bed;Shower seat Additional Comments: Pt's husband reports pt was independent prior to hospitalization for COVID in June 2020; pt had prolonged hospitalization but discharged late July; came home from rehab in mid November (using sit to stand mechanical lift and hospital bed).  Has been on dialysis for 3 years (used a transport chair when going out to HD prior to June 2020).    Prior Function Level of  Independence: Needs assistance   Gait / Transfers Assistance Needed: husband has been assisting with slideboard t/f's to wheelchair and propels w/c for patient. Pt sees HHPT 2x/wk and reports attempts to stand with HHPT, but is limited by shortened connective tissue of achilles.  ADL's / Homemaking Assistance Needed: Pt requires asssist for most LB ADLs including bathing/dressing, states  Husband performs all household IADLs as well including getting groceries and cooking.  Comments: Pt reports that she recieves transportation from [facility] to get to MD appts and HD.     Hand Dominance        Extremity/Trunk Assessment   Upper Extremity Assessment Upper Extremity Assessment: (grossly at least 4/5 throughout)    Lower Extremity Assessment Lower Extremity Assessment: Generalized weakness(grossly at least 4-/5 throughout; lacking approx 10-15 degrees passive DF bilat ankles)       Communication   Communication: No difficulties  Cognition Arousal/Alertness: Awake/alert Behavior During Therapy: WFL for tasks assessed/performed Overall Cognitive Status: Within Functional Limits for tasks assessed                                        General Comments      Exercises Other Exercises Other Exercises: Lateral scooting edge of bed, max assist for forward trunk lean, lift off and lateral movement.  Very fearful/hesitant of forward trunk lean; limied ability to generate adequate co-contraction of bilat quads/hams in closed-chain position   Assessment/Plan    PT Assessment Patient needs continued PT services  PT Problem List Decreased strength;Decreased range of motion;Decreased activity tolerance;Decreased balance;Decreased mobility;Decreased cognition;Decreased knowledge of use of DME;Decreased safety awareness;Decreased knowledge of precautions;Cardiopulmonary status limiting activity;Impaired sensation;Decreased skin integrity;Pain       PT Treatment Interventions DME instruction;Functional mobility training;Therapeutic activities;Patient/family education;Balance training;Therapeutic exercise    PT Goals (Current goals can be found in the Care Plan section)  Acute Rehab PT Goals Patient Stated Goal: to go home PT Goal Formulation: With patient/family Time For Goal Achievement: 07/14/19 Potential to Achieve Goals: Good    Frequency Min 2X/week    Barriers to discharge        Co-evaluation               AM-PAC PT "6 Clicks" Mobility  Outcome Measure Help needed turning from your back to your side while in a flat bed without using bedrails?: A Little Help needed moving from lying on your back to sitting on the side of a flat bed without using bedrails?: A Lot Help needed moving to and from a bed to a chair (including a wheelchair)?: A Lot Help needed standing up from a chair using your arms (e.g., wheelchair or bedside chair)?: Total Help needed to walk in hospital room?: Total Help needed climbing 3-5 steps with a railing? : Total 6 Click Score: 10    End of Session   Activity Tolerance: Patient tolerated treatment well Patient left: in bed;with call bell/phone within reach;with bed alarm set;with family/visitor present Nurse Communication: Mobility status PT Visit Diagnosis: Muscle weakness (generalized) (M62.81);Difficulty in walking, not elsewhere classified (R26.2)    Time: 1287-8676 PT Time Calculation (min) (ACUTE ONLY): 24 min   Charges:   PT Evaluation $PT Eval Moderate Complexity: 1 Mod PT Treatments $Therapeutic Activity: 8-22 mins        Yunuen Mordan H. Owens Shark, PT, DPT, NCS 06/30/19, 9:51 PM [phone]

## 2019-06-30 NOTE — Progress Notes (Signed)
Released to care of [facility] to transport home. Husband reports no questions. Belongings in possession of husband.

## 2019-06-30 NOTE — Progress Notes (Signed)
[facility]  ROUNDING NOTE   Subjective:   Hemodialysis treatment yesterday. UF of 3 liters.   Patient working with OT this morning.   Objective:  Vital signs in last 24 hours:  Temp:  [98 F (36.7 C)-98.3 F (36.8 C)] 98.3 F (36.8 C) (04/27 0810) Pulse Rate:  [55-75] 65 (04/27 0810) Resp:  [10-24] 18 (04/27 0810) BP: (98-210)/(43-116) 119/43 (04/27 0810) SpO2:  [90 %-100 %] 95 % (04/27 0810) Weight:  [75.8 kg-80.7 kg] 76.1 kg (04/27 0525)  Weight change:  Filed Weights   06/29/19 1203 06/29/19 2006 06/30/19 0525  Weight: 80.7 kg 75.8 kg 76.1 kg    Intake/Output: No intake/output data recorded.   Intake/Output this shift:  Total I/O In: 240 [P.O.:240] Out: 0   Physical Exam: General: NAD, sitting up in bed  Head: Normocephalic, atraumatic. Moist oral mucosal membranes  Eyes: Anicteric, PERRL  Neck: Supple, trachea midline  Lungs:  Clear to auscultation  Heart: Regular rate and rhythm  Abdomen:  Soft, nontender, +PEG  Extremities:  no peripheral edema.  Neurologic: Nonfocal, moving all four extremities  Skin: No lesions  Access: LIJ permcath    Basic Metabolic Panel: Recent Labs  Lab 06/23/19 1600 06/29/19 1156 06/30/19 0554  NA 138 135 141  K 4.9 5.0 3.1*  CL 97 96* 97*  CO2 28 26 34*  GLUCOSE 185* 148* 112*  BUN 21 26* 13  CREATININE 4.30* 6.31* 4.12*  CALCIUM 8.4* 9.0 8.4*    Liver Function Tests: Recent Labs  Lab 06/29/19 1223  AST 89*  ALT 12  ALKPHOS 53  BILITOT 1.5*  PROT 7.0  ALBUMIN 2.8*   Recent Labs  Lab 06/29/19 1223  LIPASE 14   No results for input(s): AMMONIA in the last 168 hours.  CBC: Recent Labs  Lab 06/23/19 1600 06/29/19 1156 06/30/19 0554  WBC 5.3 6.5 6.4  HGB 10.8* 11.8* 10.2*  HCT 35.9 37.6 32.8*  MCV 95 92.2 95.1  PLT 194 236 227    Cardiac Enzymes: No results for input(s): CKTOTAL, CKMB, CKMBINDEX, TROPONINI in the last 168 hours.  BNP: Invalid input(s): POCBNP  CBG: Recent  Labs  Lab 06/30/19 0739  GLUCAP 110*    Microbiology: Results for orders placed or performed during the hospital encounter of 06/29/19  Respiratory Panel by RT PCR (Flu A&B, Covid) - Nasopharyngeal Swab     Status: None   Collection Time: 06/29/19  3:39 PM   Specimen: Nasopharyngeal Swab  Result Value Ref Range Status   SARS Coronavirus 2 by RT PCR NEGATIVE NEGATIVE Final    Comment: (NOTE) SARS-CoV-2 target nucleic acids are NOT DETECTED. The SARS-CoV-2 RNA is generally detectable in upper respiratoy specimens during the acute phase of infection. The lowest concentration of SARS-CoV-2 viral copies this assay can detect is 131 copies/mL. A negative result does not preclude SARS-Cov-2 infection and should not be used as the sole basis for treatment or other patient management decisions. A negative result may occur with  improper specimen collection/handling, submission of specimen other than nasopharyngeal swab, presence of viral mutation(s) within the areas targeted by this assay, and inadequate number of viral copies (<131 copies/mL). A negative result must be combined with clinical observations, patient history, and epidemiological information. The expected result is Negative. Fact Sheet for Patients:  PinkCheek.be Fact Sheet for Healthcare Providers:  GravelBags.it This test is not yet ap proved or cleared by the [place] FDA and  has been authorized for detection and/or diagnosis of  SARS-CoV-2 by FDA under an Emergency Use Authorization (EUA). This EUA will remain  in effect (meaning this test can be used) for the duration of the COVID-19 declaration under Section 564(b)(1) of the Act, 21 U.S.C. section 360bbb-3(b)(1), unless the authorization is terminated or revoked sooner.    Influenza A by PCR NEGATIVE NEGATIVE Final   Influenza B by PCR NEGATIVE NEGATIVE Final    Comment: (NOTE) The Xpert Xpress  SARS-CoV-2/FLU/RSV assay is intended as an aid in  the diagnosis of influenza from Nasopharyngeal swab specimens and  should not be used as a sole basis for treatment. Nasal washings and  aspirates are unacceptable for Xpert Xpress SARS-CoV-2/FLU/RSV  testing. Fact Sheet for Patients: PinkCheek.be Fact Sheet for Healthcare Providers: GravelBags.it This test is not yet approved or cleared by the [place] FDA and  has been authorized for detection and/or diagnosis of SARS-CoV-2 by  FDA under an Emergency Use Authorization (EUA). This EUA will remain  in effect (meaning this test can be used) for the duration of the  Covid-19 declaration under Section 564(b)(1) of the Act, 21  U.S.C. section 360bbb-3(b)(1), unless the authorization is  terminated or revoked. Performed at [facility], [address] [place], [place] [address]   MRSA PCR Screening     Status: None   Collection Time: 06/30/19 12:33 AM   Specimen: Nasal Mucosa; Nasopharyngeal  Result Value Ref Range Status   MRSA by PCR NEGATIVE NEGATIVE Final    Comment:        The GeneXpert MRSA Assay (FDA approved for NASAL specimens only), is one component of a comprehensive MRSA colonization surveillance program. It is not intended to diagnose MRSA infection nor to guide or monitor treatment for MRSA infections. Performed at [facility], [address] [place],  [address]     Coagulation Studies: No results for input(s): LABPROT, INR in the last 72 hours.  Urinalysis: No results for input(s): COLORURINE, LABSPEC, PHURINE, GLUCOSEU, HGBUR, BILIRUBINUR, KETONESUR, PROTEINUR, UROBILINOGEN, NITRITE, LEUKOCYTESUR in the last 72 hours.  Invalid input(s): APPERANCEUR    Imaging: CT ABDOMEN PELVIS WO CONTRAST  Result Date: 06/29/2019 CLINICAL DATA:  Nausea, vomiting, diarrhea, chest pain since yesterday EXAM: CT ABDOMEN AND PELVIS  WITHOUT CONTRAST TECHNIQUE: Multidetector CT imaging of the abdomen and pelvis was performed following the standard protocol without IV contrast. COMPARISON:  06/29/2019, 10/23/2015 FINDINGS: Lower chest: There are small bilateral pleural effusions. Rounded atelectasis again seen within the left lower lobe. Heart is enlarged without pericardial effusion. Hepatobiliary: No focal liver abnormality is seen. Status post cholecystectomy. No biliary dilatation. Pancreas: Unremarkable. No pancreatic ductal dilatation or surrounding inflammatory changes. Spleen: Normal in size without focal abnormality. Adrenals/Urinary Tract: Kidneys are atrophic. Calcifications of the bilateral hila are felt to be vascular. No definite urinary tract calculi or obstruction. Bladder is moderately distended without filling defect. Mild perivesicular fat stranding is nonspecific, and could reflect cystitis in the appropriate setting. Please correlate with urinalysis. The adrenals are unremarkable. Stomach/Bowel: No bowel obstruction or ileus. Normal appendix right lower quadrant. Percutaneous gastrostomy tube is seen within the gastric lumen. Vascular/Lymphatic: Aortic atherosclerosis. No enlarged abdominal or pelvic lymph nodes. Reproductive: Uterus and bilateral adnexa are unremarkable. Other: There is mild diffuse body wall edema. No free intraperitoneal fluid or free gas. Small fat containing umbilical hernia is unchanged. Musculoskeletal: No acute displaced fractures. Reconstructed images demonstrate no additional findings. IMPRESSION: 1. Small bilateral pleural effusions. 2. Mild perivesicular fat stranding is nonspecific, and could reflect cystitis in  the appropriate setting. Please correlate with urinalysis. 3. Mild diffuse body wall edema. 4. Stable rounded atelectasis left lower lobe. 5. Aortic Atherosclerosis (ICD10-I70.0). Electronically Signed   By: Randa Ngo M.D.   On: 06/29/2019 13:56   DG Chest 2 View  Result Date:  06/29/2019 CLINICAL DATA:  Shortness of breath and chest pain EXAM: CHEST - 2 VIEW COMPARISON:  April 16, 2019 FINDINGS: Central catheter tip is in the superior vena cava. No pneumothorax. There is interstitial edema with small left pleural effusion. No airspace consolidation. There is cardiomegaly with mild pulmonary venous hypertension. Patient is status post coronary artery bypass grafting. No adenopathy. No bone lesions. IMPRESSION: Cardiomegaly with pulmonary vascular congestion. Interstitial edema with left pleural effusion. Appearance is felt to be indicative of a degree of congestive heart failure. No airspace consolidation. Postoperative changes. Central catheter tip in superior vena cava. Electronically Signed   By: Lowella Grip III M.D.   On: 06/29/2019 13:05     Medications:   . feeding supplement (OSMOLITE 1.5 CAL)     . amiodarone  200 mg Oral Daily  . apixaban  5 mg Oral BID  . ascorbic acid  500 mg Oral Daily  . aspirin EC  81 mg Oral Daily  . atorvastatin  40 mg Oral Daily  . Chlorhexidine Gluconate Cloth  6 each Topical Q0600  . feeding supplement (PRO-STAT SUGAR FREE 64)  30 mL Per Tube Daily  . ferrous gluconate  324 mg Oral Daily  . FLUoxetine  20 mg Oral Daily  . folic acid  1 mg Oral Daily  . free water  20 mL Per Tube Q4H  . gabapentin  100 mg Oral TID  . hydrALAZINE  10 mg Oral Q8H  . ipratropium-albuterol  3 mL Nebulization Q6H  . levothyroxine  88 mcg Oral Q0600  . multivitamin  1 tablet Oral QHS  . nitroGLYCERIN  1 inch Topical Once  . pantoprazole  40 mg Oral BID  . traZODone  50 mg Oral QHS  . zinc sulfate  220 mg Oral Daily   acetaminophen **OR** acetaminophen, albuterol, fluticasone, hydrALAZINE, hydrocortisone cream, melatonin, morphine injection, nitroGLYCERIN, ondansetron **OR** ondansetron (ZOFRAN) IV, promethazine  Assessment/ Plan:  [name] is a 64 y.o. white female with end stage renal disease on hemodialysis, hypertension,  pulmonary hypertension, diabetes mellitus type II, diabetic neuropathy, congestive heart failure who is admitted to [facility] on 06/29/2019 for ESRD (end stage renal disease) on dialysis ([place]) [N18.6, Z99.2] Secondary hypertension [I15.9] Chest pain [R07.9] Chest pain, unspecified type [R07.9] Intractable vomiting with nausea, unspecified vomiting type [R11.2]. Patient was scheduled for a PEG removal today.   [facility]  MWF LIJ permcath 81.5kg  1. End Stage Renal Disease: hemodialysis treatment yesterday. Tolerated treatment well.   2. Hypertension: urgency on admission.Recently started on hydralazine as outpatient.  Blood pressure is at goal.   3. Anemia of chronic kidney disease: hemoglobin 10.2. Mircera as outpatient.   4. Secondary Hyperparathyroidism: PTH 420 on 4/7. Not on any binders currently.   5. Percutaneous Enteral Gastrostomy Tube:  - Consult GI for removal.    LOS: 0 Jamie Arias 4/27/202110:36 AM

## 2019-06-30 NOTE — Progress Notes (Signed)
Discharge instructions explained to pt and pts spouse/ verbalized an understanding/ iv and tele removed/ EMS called to transport home.

## 2019-06-30 NOTE — Telephone Encounter (Signed)
Prozac refilled.  Patient is currently hospitalized.  Will defer promethazine to them.

## 2019-06-30 NOTE — Progress Notes (Signed)
Initial Nutrition Assessment  DOCUMENTATION CODES:   Not applicable  INTERVENTION:   Nepro Shake po BID, each supplement provides 425 kcal and 19 grams protein  Rena-vite daily   Diet education   NUTRITION DIAGNOSIS:   Increased nutrient needs related to chronic illness(ESRD on HD, CHF) as evidenced by increased estimated needs.  GOAL:   Patient will meet greater than or equal to 90% of their needs  MONITOR:   PO intake, Supplement acceptance, Labs, Weight trends, Skin, I & O's  REASON FOR ASSESSMENT:   Consult Diet education  ASSESSMENT:   64 y.o. female with medical history significant of hypertension, hyperlipidemia, diabetes mellitus, GERD, hypothyroidism, depression, CAD, CABG, ESRD-HD (MWF), CHF, iron deficiency anemia, atrial fibrillation and left arm DVT on Eliquis, s/p PEG 03/02/20 who presents with chest pain, nausea, vomiting, diarrhea.   Met with pt in room today. Pt is well known to this RD from a recent previous admit. RD followed this patient during her last admit from 02/10/2019-04/20/2019. Pt discharged on tube feeds and is s/p her last tube exchange on 2/6. Pt reports good appetite and oral intake pta. Pt reports that she has not used her tube for the past 2 months. Pt reports that she has been able to eat well on her own; pt has not been drinking any supplements at home. G-tube remains in place; pt is requesting to have tube removed. Pt ate 50% of her breakfast today and 100% of her lunch. RD discussed with pt the importance of adequate nutrition needed to replace losses from HD. Pt is willing to drink vanilla or butter pecan Nepro in hospital. Per chart, pt down 9lbs(5%) since her discharge; RD unsure when exactly weight loss occurred as pt was volume overloaded during her last admit.   RD provided patient with renal diet education today; recommended daily renal multivitamin and protein supplements after discharge.   Medications reviewed and include: vitamin  C, aspirin, folic acid, synthroid, rena-vite, protonix, zinc  Labs reviewed: K 3.1(L), creat 4.12(H) AST 89(H), tbili 1.5(H) Hgb 10.2(L), Hct 32.8(L)  NUTRITION - FOCUSED PHYSICAL EXAM:    Most Recent Value  Orbital Region  No depletion  Upper Arm Region  No depletion  Thoracic and Lumbar Region  No depletion  Buccal Region  No depletion  Temple Region  No depletion  Clavicle Bone Region  No depletion  Clavicle and Acromion Bone Region  No depletion  Scapular Bone Region  No depletion  Dorsal Hand  No depletion  Patellar Region  No depletion  Anterior Thigh Region  No depletion  Posterior Calf Region  No depletion  Edema (RD Assessment)  None  Hair  Reviewed  Eyes  Reviewed  Mouth  Reviewed  Skin  Reviewed  Nails  Reviewed     Diet Order:   Diet Order            Diet renal/carb modified with fluid restriction Diet-HS Snack? Nothing; Fluid restriction: 1200 mL Fluid; Room service appropriate? Yes; Fluid consistency: Thin  Diet effective now             EDUCATION NEEDS:   Education needs have been addressed  Skin:  Skin Assessment: Reviewed RN Assessment(MASD, ecchymosis)  Last BM:  4/26  Height:   Ht Readings from Last 1 Encounters:  06/29/19 '5\' 4"'$  (1.626 m)    Weight:   Wt Readings from Last 1 Encounters:  06/30/19 76.1 kg    Ideal Body Weight:  54.5 kg  BMI:  Body  mass index is 28.79 kg/m.  Estimated Nutritional Needs:   Kcal:  1600-1800kcal/day  Protein:  80-90g/day  Fluid:  UOP +1L  Koleen Distance MS, RD, LDN Please refer to [facility] for RD and/or RD on-call/weekend/after hours pager

## 2019-06-30 NOTE — Progress Notes (Signed)
   06/30/19 0018  ReDS Vest / Clip  Station Marker A  Ruler Value 33  ReDS Value Range 36 - 40  ReDS Actual Value 39

## 2019-06-30 NOTE — Progress Notes (Signed)
Hemodialysis patient known at [facility], also known as [employer], TTS 12:15, lives at home with husband. Please contact me with any dialysis placement concerns.

## 2019-07-01 ENCOUNTER — Telehealth: Payer: Self-pay

## 2019-07-01 ENCOUNTER — Other Ambulatory Visit (INDEPENDENT_AMBULATORY_CARE_PROVIDER_SITE_OTHER): Payer: Self-pay | Admitting: Nurse Practitioner

## 2019-07-01 NOTE — Telephone Encounter (Signed)
First attempt to reach patient for transition of care. No answer. Left message that Nurse will call back for follow up as appropriate. Keep tentative appointment scheduled 07/07/19 at 12:30.

## 2019-07-01 NOTE — Telephone Encounter (Signed)
Second attempt to reach patient for transition of care hospital follow up. Will follow as appropriate.

## 2019-07-02 ENCOUNTER — Encounter
Admission: RE | Admit: 2019-07-02 | Discharge: 2019-07-02 | Disposition: A | Discharge status: Home / Self Care | Payer: Medicare Other | Source: Ambulatory Visit | Attending: Vascular Surgery | Admitting: Vascular Surgery

## 2019-07-02 DIAGNOSIS — Z01812 Encounter for preprocedural laboratory examination: Secondary | ICD-10-CM | POA: Insufficient documentation

## 2019-07-02 HISTORY — DX: Unspecified osteoarthritis, unspecified site: M19.90

## 2019-07-02 NOTE — Telephone Encounter (Signed)
3rd attempt--unable to reach pt.

## 2019-07-02 NOTE — Patient Instructions (Signed)
Your procedure is scheduled on: Thursday Jul 09, 2019 Report to Day Surgery inside [place]. To find out your arrival time please call [phone] between 1PM - 3PM on Wednesday Jul 08, 2019 .  Remember: Instructions that are not followed completely may result in serious medical risk,  up to and including death, or upon the discretion of your surgeon and anesthesiologist your  surgery may need to be rescheduled.     _X__ 1. Do not eat food after midnight the night before your procedure.                 No gum chewing or hard candies. You may drink clear liquids up to 2 hours                 before you are scheduled to arrive for your surgery- DO not drink clear                 liquids within 2 hours of the start of your surgery.                 Clear Liquids include:  water, apple juice without pulp, clear Gatorade, G2 or                  Gatorade Zero (avoid Red/Purple/Blue), Black Coffee or Tea (Do not add                 anything to coffee or tea).  __X__2.  On the morning of surgery brush your teeth with toothpaste and water, you                may rinse your mouth with mouthwash if you wish.  Do not swallow any toothpaste of mouthwash.     _X__ 3.  No Alcohol for 24 hours before or after surgery.   _X__ 4.  Do Not Smoke or use e-cigarettes For 24 Hours Prior to Your Surgery.                 Do not use any chewable tobacco products for at least 6 hours prior to                 Surgery.  _X__  5.  Do not use any recreational drugs (marijuana, cocaine, heroin, ecstacy, MDMA or other)                For at least one week prior to your surgery.  Combination of these drugs with anesthesia                May have life threatening results.  __X__ 6.  Notify your doctor if there is any change in your medical condition      (cold, fever, infections).     Do not wear jewelry, make-up, hairpins, clips or nail polish. Do not wear lotions, powders, or perfumes. You  may wear deodorant. Do not shave 48 hours prior to surgery. Men may shave face and neck. Do not bring valuables to the hospital.    [facility] is not responsible for any belongings or valuables.  Contacts, dentures or bridgework may not be worn into surgery. Leave your suitcase in the car. After surgery it may be brought to your room. For patients admitted to the hospital, discharge time is determined by your treatment team.   Patients discharged the day of surgery will not be allowed to drive home.   Make arrangements for someone to be with  you for the first 24 hours of your Same Day Discharge.   __X__ Take these medicines the morning of surgery with A SIP OF WATER:    1. levothyroxine (SYNTHROID) 88 MCG  2. gabapentin (NEURONTIN) 100 MG  3. pantoprazole sodium (PROTONIX) 40   4. hydrALAZINE (APRESOLINE) 10 MG   __X__ Use CHG Soap or wipes) as directed  __X__ Use inhalers on the day of surgery  ipratropium-albuterol (DUONEB) 0.5-2.5 (3) MG/3ML SOLN  fluticasone (FLONASE) 50 MCG/ACT nasal spray  __X__ Stop ELIQUIS 5 MG TABS on Jul 06, 2019 and then resume as provider instructs.  __X__ Stop Anti-inflammatories ibuprofen, Aleve, naproxen and or BC powders.   __X__ Stop supplements until after surgery.    __X__ Do not start any herbal supplements before your surgery.

## 2019-07-03 DIAGNOSIS — N186 End stage renal disease: Secondary | ICD-10-CM | POA: Diagnosis not present

## 2019-07-03 DIAGNOSIS — Z992 Dependence on renal dialysis: Secondary | ICD-10-CM | POA: Diagnosis not present

## 2019-07-06 ENCOUNTER — Telehealth: Payer: Self-pay | Admitting: Family Medicine

## 2019-07-06 NOTE — Telephone Encounter (Signed)
I called and spoke with the patients husband and he stated he forgot and he did need to cancel her appointment, I cancelled her appt for him and let the provider know.  Isis Costanza,cma

## 2019-07-06 NOTE — Telephone Encounter (Signed)
Patient has an appointment scheduled tomorrow. Can you call and see if she is planning on coming to her appointment tomorrow? You may need to call her husband to check on this. Thanks.

## 2019-07-07 ENCOUNTER — Other Ambulatory Visit: Payer: Self-pay

## 2019-07-07 ENCOUNTER — Ambulatory Visit: Payer: Medicare Other | Admitting: Family Medicine

## 2019-07-07 ENCOUNTER — Other Ambulatory Visit
Admission: RE | Admit: 2019-07-07 | Discharge: 2019-07-07 | Disposition: A | Discharge status: Home / Self Care | Payer: Medicare Other | Source: Ambulatory Visit | Attending: Vascular Surgery | Admitting: Vascular Surgery

## 2019-07-07 DIAGNOSIS — E1122 Type 2 diabetes mellitus with diabetic chronic kidney disease: Secondary | ICD-10-CM | POA: Insufficient documentation

## 2019-07-07 DIAGNOSIS — Z01812 Encounter for preprocedural laboratory examination: Secondary | ICD-10-CM | POA: Insufficient documentation

## 2019-07-07 DIAGNOSIS — Z992 Dependence on renal dialysis: Secondary | ICD-10-CM | POA: Diagnosis not present

## 2019-07-07 DIAGNOSIS — Z20822 Contact with and (suspected) exposure to covid-19: Secondary | ICD-10-CM | POA: Insufficient documentation

## 2019-07-07 DIAGNOSIS — N186 End stage renal disease: Secondary | ICD-10-CM | POA: Insufficient documentation

## 2019-07-07 DIAGNOSIS — I5032 Chronic diastolic (congestive) heart failure: Secondary | ICD-10-CM | POA: Diagnosis not present

## 2019-07-07 DIAGNOSIS — I252 Old myocardial infarction: Secondary | ICD-10-CM | POA: Diagnosis not present

## 2019-07-07 DIAGNOSIS — E785 Hyperlipidemia, unspecified: Secondary | ICD-10-CM | POA: Insufficient documentation

## 2019-07-07 DIAGNOSIS — Z79899 Other long term (current) drug therapy: Secondary | ICD-10-CM | POA: Insufficient documentation

## 2019-07-07 DIAGNOSIS — I132 Hypertensive heart and chronic kidney disease with heart failure and with stage 5 chronic kidney disease, or end stage renal disease: Secondary | ICD-10-CM | POA: Insufficient documentation

## 2019-07-07 DIAGNOSIS — E039 Hypothyroidism, unspecified: Secondary | ICD-10-CM | POA: Diagnosis not present

## 2019-07-07 DIAGNOSIS — I2510 Atherosclerotic heart disease of native coronary artery without angina pectoris: Secondary | ICD-10-CM | POA: Insufficient documentation

## 2019-07-07 LAB — CBC WITH DIFFERENTIAL/PLATELET
Abs Immature Granulocytes: 0.01 10*3/uL (ref 0.00–0.07)
Basophils Absolute: 0.1 10*3/uL (ref 0.0–0.1)
Basophils Relative: 2 %
Eosinophils Absolute: 0.2 10*3/uL (ref 0.0–0.5)
Eosinophils Relative: 4 %
HCT: 36.9 % (ref 36.0–46.0)
Hemoglobin: 11.4 g/dL — ABNORMAL LOW (ref 12.0–15.0)
Immature Granulocytes: 0 %
Lymphocytes Relative: 22 %
Lymphs Abs: 0.9 10*3/uL (ref 0.7–4.0)
MCH: 28.5 pg (ref 26.0–34.0)
MCHC: 30.9 g/dL (ref 30.0–36.0)
MCV: 92.3 fL (ref 80.0–100.0)
Monocytes Absolute: 0.3 10*3/uL (ref 0.1–1.0)
Monocytes Relative: 7 %
Neutro Abs: 2.6 10*3/uL (ref 1.7–7.7)
Neutrophils Relative %: 65 %
Platelets: 156 10*3/uL (ref 150–400)
RBC: 4 MIL/uL (ref 3.87–5.11)
RDW: 17.9 % — ABNORMAL HIGH (ref 11.5–15.5)
WBC: 4.1 10*3/uL (ref 4.0–10.5)
nRBC: 0 % (ref 0.0–0.2)

## 2019-07-07 LAB — BASIC METABOLIC PANEL
Anion gap: 8 (ref 5–15)
BUN: 19 mg/dL (ref 8–23)
CO2: 30 mmol/L (ref 22–32)
Calcium: 8.4 mg/dL — ABNORMAL LOW (ref 8.9–10.3)
Chloride: 96 mmol/L — ABNORMAL LOW (ref 98–111)
Creatinine, Ser: 3.99 mg/dL — ABNORMAL HIGH (ref 0.44–1.00)
GFR calc Af Amer: 13 mL/min — ABNORMAL LOW (ref >60)
GFR calc non Af Amer: 11 mL/min — ABNORMAL LOW (ref >60)
Glucose, Bld: 155 mg/dL — ABNORMAL HIGH (ref 70–99)
Potassium: 4.1 mmol/L (ref 3.5–5.1)
Sodium: 134 mmol/L — ABNORMAL LOW (ref 135–145)

## 2019-07-07 LAB — TYPE AND SCREEN
ABO/RH(D): A POS
Antibody Screen: NEGATIVE

## 2019-07-07 LAB — PROTIME-INR
INR: 1.4 — ABNORMAL HIGH (ref 0.8–1.2)
Prothrombin Time: 16.4 seconds — ABNORMAL HIGH (ref 11.4–15.2)

## 2019-07-07 LAB — APTT: aPTT: 38 seconds — ABNORMAL HIGH (ref 24–36)

## 2019-07-07 LAB — SARS CORONAVIRUS 2 (TAT 6-24 HRS): SARS Coronavirus 2: NEGATIVE

## 2019-07-07 NOTE — Pre-Procedure Instructions (Signed)
Pre-Admit Testing Provider Notification Note  Provider Notified: Dew  Notification Mode: Fax  Reason: Abnormal lab result.  Response: Fax confirmation received.   Additional Information: Placed on chart. Noted on Pre-Admit Worksheet.  Signed: Beulah Gandy, RN

## 2019-07-09 ENCOUNTER — Encounter
Admission: RE | Disposition: A | Discharge status: Home / Self Care | Payer: Self-pay | Source: Home / Self Care | Attending: Vascular Surgery

## 2019-07-09 ENCOUNTER — Ambulatory Visit
Admission: RE | Admit: 2019-07-09 | Discharge: 2019-07-09 | Disposition: A | Discharge status: Home / Self Care | Payer: Medicare Other | Attending: Vascular Surgery | Admitting: Vascular Surgery

## 2019-07-09 ENCOUNTER — Other Ambulatory Visit: Payer: Self-pay

## 2019-07-09 ENCOUNTER — Ambulatory Visit: Payer: Medicare Other | Admitting: Anesthesiology

## 2019-07-09 ENCOUNTER — Encounter: Payer: Self-pay | Admitting: Vascular Surgery

## 2019-07-09 ENCOUNTER — Ambulatory Visit: Payer: Medicare Other

## 2019-07-09 DIAGNOSIS — Z886 Allergy status to analgesic agent status: Secondary | ICD-10-CM | POA: Diagnosis not present

## 2019-07-09 DIAGNOSIS — I272 Pulmonary hypertension, unspecified: Secondary | ICD-10-CM | POA: Diagnosis not present

## 2019-07-09 DIAGNOSIS — Z881 Allergy status to other antibiotic agents status: Secondary | ICD-10-CM | POA: Insufficient documentation

## 2019-07-09 DIAGNOSIS — Z9049 Acquired absence of other specified parts of digestive tract: Secondary | ICD-10-CM | POA: Diagnosis not present

## 2019-07-09 DIAGNOSIS — I5032 Chronic diastolic (congestive) heart failure: Secondary | ICD-10-CM | POA: Diagnosis not present

## 2019-07-09 DIAGNOSIS — E785 Hyperlipidemia, unspecified: Secondary | ICD-10-CM | POA: Diagnosis not present

## 2019-07-09 DIAGNOSIS — Z7952 Long term (current) use of systemic steroids: Secondary | ICD-10-CM | POA: Insufficient documentation

## 2019-07-09 DIAGNOSIS — K219 Gastro-esophageal reflux disease without esophagitis: Secondary | ICD-10-CM | POA: Diagnosis not present

## 2019-07-09 DIAGNOSIS — I255 Ischemic cardiomyopathy: Secondary | ICD-10-CM | POA: Diagnosis not present

## 2019-07-09 DIAGNOSIS — I252 Old myocardial infarction: Secondary | ICD-10-CM | POA: Diagnosis not present

## 2019-07-09 DIAGNOSIS — I2510 Atherosclerotic heart disease of native coronary artery without angina pectoris: Secondary | ICD-10-CM | POA: Diagnosis not present

## 2019-07-09 DIAGNOSIS — N186 End stage renal disease: Secondary | ICD-10-CM | POA: Diagnosis not present

## 2019-07-09 DIAGNOSIS — Z833 Family history of diabetes mellitus: Secondary | ICD-10-CM | POA: Diagnosis not present

## 2019-07-09 DIAGNOSIS — E039 Hypothyroidism, unspecified: Secondary | ICD-10-CM | POA: Diagnosis not present

## 2019-07-09 DIAGNOSIS — I132 Hypertensive heart and chronic kidney disease with heart failure and with stage 5 chronic kidney disease, or end stage renal disease: Secondary | ICD-10-CM | POA: Diagnosis present

## 2019-07-09 DIAGNOSIS — E1122 Type 2 diabetes mellitus with diabetic chronic kidney disease: Secondary | ICD-10-CM | POA: Insufficient documentation

## 2019-07-09 DIAGNOSIS — N185 Chronic kidney disease, stage 5: Secondary | ICD-10-CM

## 2019-07-09 DIAGNOSIS — I700 Atherosclerosis of aorta: Secondary | ICD-10-CM | POA: Diagnosis not present

## 2019-07-09 DIAGNOSIS — E1140 Type 2 diabetes mellitus with diabetic neuropathy, unspecified: Secondary | ICD-10-CM | POA: Diagnosis not present

## 2019-07-09 DIAGNOSIS — Z951 Presence of aortocoronary bypass graft: Secondary | ICD-10-CM | POA: Diagnosis not present

## 2019-07-09 DIAGNOSIS — Z931 Gastrostomy status: Secondary | ICD-10-CM | POA: Insufficient documentation

## 2019-07-09 DIAGNOSIS — Z992 Dependence on renal dialysis: Secondary | ICD-10-CM | POA: Diagnosis not present

## 2019-07-09 DIAGNOSIS — Z8249 Family history of ischemic heart disease and other diseases of the circulatory system: Secondary | ICD-10-CM | POA: Diagnosis not present

## 2019-07-09 DIAGNOSIS — Z419 Encounter for procedure for purposes other than remedying health state, unspecified: Secondary | ICD-10-CM

## 2019-07-09 HISTORY — PX: AV FISTULA PLACEMENT: SHX1204

## 2019-07-09 LAB — POCT I-STAT, CHEM 8
BUN: 19 mg/dL (ref 8–23)
Calcium, Ion: 1.16 mmol/L (ref 1.15–1.40)
Chloride: 97 mmol/L — ABNORMAL LOW (ref 98–111)
Creatinine, Ser: 4 mg/dL — ABNORMAL HIGH (ref 0.44–1.00)
Glucose, Bld: 122 mg/dL — ABNORMAL HIGH (ref 70–99)
HCT: 40 % (ref 36.0–46.0)
Hemoglobin: 13.6 g/dL (ref 12.0–15.0)
Potassium: 4.1 mmol/L (ref 3.5–5.1)
Sodium: 137 mmol/L (ref 135–145)
TCO2: 31 mmol/L (ref 22–32)

## 2019-07-09 LAB — GLUCOSE, CAPILLARY: Glucose-Capillary: 160 mg/dL — ABNORMAL HIGH (ref 70–99)

## 2019-07-09 SURGERY — INSERTION OF ARTERIOVENOUS (AV) GORE-TEX GRAFT ARM
Anesthesia: Regional | Site: Arm Upper | Laterality: Left

## 2019-07-09 MED ORDER — MIDAZOLAM HCL 2 MG/2ML IJ SOLN: 1.0000 mg | INTRAMUSCULAR | Status: DC | PRN

## 2019-07-09 MED ORDER — SODIUM CHLORIDE 0.9 % IV SOLN
INTRAVENOUS | Status: DC | PRN
  Administered 2019-07-09: 50 mL via INTRAMUSCULAR

## 2019-07-09 MED ORDER — CHLORHEXIDINE GLUCONATE CLOTH 2 % EX PADS: 6.0000 | MEDICATED_PAD | Freq: Once | CUTANEOUS | Status: DC

## 2019-07-09 MED ORDER — PROPOFOL 10 MG/ML IV BOLUS
INTRAVENOUS | Status: AC
  Filled 2019-07-09: qty 20

## 2019-07-09 MED ORDER — FENTANYL CITRATE (PF) 100 MCG/2ML IJ SOLN
INTRAMUSCULAR | Status: AC
  Administered 2019-07-09: 12:00:00 50 ug via INTRAVENOUS
  Filled 2019-07-09: qty 2

## 2019-07-09 MED ORDER — "VISTASEAL 4 ML SINGLE DOSE KIT "
PACK | CUTANEOUS | Status: DC | PRN
  Administered 2019-07-09: 4 mL via TOPICAL

## 2019-07-09 MED ORDER — PHENYLEPHRINE HCL-NACL 10-0.9 MG/250ML-% IV SOLN
INTRAVENOUS | Status: DC | PRN
Start: 2019-07-09 — End: 2019-07-09
  Administered 2019-07-09: 50 ug/min via INTRAVENOUS

## 2019-07-09 MED ORDER — FAMOTIDINE 20 MG PO TABS
ORAL_TABLET | ORAL | Status: AC
  Administered 2019-07-09: 20 mg
  Filled 2019-07-09: qty 1

## 2019-07-09 MED ORDER — CHLORHEXIDINE GLUCONATE CLOTH 2 % EX PADS
6.0000 | MEDICATED_PAD | Freq: Once | CUTANEOUS | Status: AC
  Administered 2019-07-09: 6 via TOPICAL

## 2019-07-09 MED ORDER — ROPIVACAINE HCL 5 MG/ML IJ SOLN
INTRAMUSCULAR | Status: DC | PRN
Start: 2019-07-09 — End: 2019-07-09
  Administered 2019-07-09: 25 mL via PERINEURAL

## 2019-07-09 MED ORDER — ACETAMINOPHEN 160 MG/5ML PO SOLN
325.0000 mg | ORAL | Status: DC | PRN
  Filled 2019-07-09: qty 20.3

## 2019-07-09 MED ORDER — ROPIVACAINE HCL 5 MG/ML IJ SOLN
INTRAMUSCULAR | Status: AC
  Filled 2019-07-09: qty 30

## 2019-07-09 MED ORDER — BUPIVACAINE-EPINEPHRINE (PF) 0.5% -1:200000 IJ SOLN
INTRAMUSCULAR | Status: AC
  Filled 2019-07-09: qty 90

## 2019-07-09 MED ORDER — DEXAMETHASONE SODIUM PHOSPHATE 10 MG/ML IJ SOLN
INTRAMUSCULAR | Status: AC
  Filled 2019-07-09: qty 1

## 2019-07-09 MED ORDER — PHENYLEPHRINE HCL (PRESSORS) 10 MG/ML IV SOLN
INTRAVENOUS | Status: DC | PRN
  Administered 2019-07-09 (×2): 100 ug via INTRAVENOUS

## 2019-07-09 MED ORDER — HEPARIN SODIUM (PORCINE) 5000 UNIT/ML IJ SOLN
INTRAMUSCULAR | Status: AC
  Filled 2019-07-09: qty 1

## 2019-07-09 MED ORDER — LIDOCAINE HCL (PF) 1 % IJ SOLN
INTRAMUSCULAR | Status: AC
  Filled 2019-07-09: qty 5

## 2019-07-09 MED ORDER — HYDROCODONE-ACETAMINOPHEN 5-325 MG PO TABS: 1.0000 | ORAL_TABLET | Freq: Four times a day (QID) | ORAL | 0 refills | Status: DC | PRN

## 2019-07-09 MED ORDER — EPINEPHRINE PF 1 MG/ML IJ SOLN
INTRAMUSCULAR | Status: AC
  Filled 2019-07-09: qty 1

## 2019-07-09 MED ORDER — PROPOFOL 500 MG/50ML IV EMUL
INTRAVENOUS | Status: DC | PRN
  Administered 2019-07-09: 20 mg via INTRAVENOUS
  Administered 2019-07-09: 50 ug/kg/min via INTRAVENOUS

## 2019-07-09 MED ORDER — PROPOFOL 500 MG/50ML IV EMUL
INTRAVENOUS | Status: AC
  Filled 2019-07-09: qty 50

## 2019-07-09 MED ORDER — MIDAZOLAM HCL 2 MG/2ML IJ SOLN
INTRAMUSCULAR | Status: AC
  Administered 2019-07-09: 1 mg via INTRAVENOUS
  Filled 2019-07-09: qty 2

## 2019-07-09 MED ORDER — CEFAZOLIN SODIUM-DEXTROSE 1-4 GM/50ML-% IV SOLN
INTRAVENOUS | Status: AC
  Filled 2019-07-09: qty 50

## 2019-07-09 MED ORDER — CEFAZOLIN SODIUM-DEXTROSE 1-4 GM/50ML-% IV SOLN
1.0000 g | INTRAVENOUS | Status: AC
  Administered 2019-07-09: 1 g via INTRAVENOUS

## 2019-07-09 MED ORDER — PROMETHAZINE HCL 25 MG/ML IJ SOLN: 6.2500 mg | INTRAMUSCULAR | Status: DC | PRN

## 2019-07-09 MED ORDER — ACETAMINOPHEN 325 MG PO TABS: 325.0000 mg | ORAL_TABLET | ORAL | Status: DC | PRN

## 2019-07-09 MED ORDER — FAMOTIDINE 20 MG PO TABS: 20.0000 mg | ORAL_TABLET | Freq: Once | ORAL | Status: DC

## 2019-07-09 MED ORDER — HEMOSTATIC AGENTS (NO CHARGE) OPTIME
TOPICAL | Status: DC | PRN
  Administered 2019-07-09 (×2): 1 via TOPICAL

## 2019-07-09 MED ORDER — HEPARIN SODIUM (PORCINE) 1000 UNIT/ML IJ SOLN
INTRAMUSCULAR | Status: DC | PRN
  Administered 2019-07-09: 3000 [IU] via INTRAVENOUS

## 2019-07-09 MED ORDER — SODIUM CHLORIDE 0.9 % IV SOLN: INTRAVENOUS | Status: DC

## 2019-07-09 MED ORDER — FENTANYL CITRATE (PF) 100 MCG/2ML IJ SOLN: 50.0000 ug | INTRAMUSCULAR | Status: DC | PRN

## 2019-07-09 SURGICAL SUPPLY — 54 items
BAG DECANTER FOR FLEXI CONT (MISCELLANEOUS) ×2 IMPLANT
BLADE SURG SZ11 CARB STEEL (BLADE) ×2 IMPLANT
BOOT SUTURE AID YELLOW STND (SUTURE) ×2 IMPLANT
BRUSH SCRUB EZ  4% CHG (MISCELLANEOUS) ×1
BRUSH SCRUB EZ 4% CHG (MISCELLANEOUS) ×1 IMPLANT
CANISTER SUCT 1200ML W/VALVE (MISCELLANEOUS) ×2 IMPLANT
CHLORAPREP W/TINT 26 (MISCELLANEOUS) ×2 IMPLANT
CLIP SPRNG 6MM S-JAW DBL (CLIP) ×2
COVER WAND RF STERILE (DRAPES) ×2 IMPLANT
DECANTER SPIKE VIAL GLASS SM (MISCELLANEOUS) IMPLANT
DERMABOND ADVANCED (GAUZE/BANDAGES/DRESSINGS) ×1
DERMABOND ADVANCED .7 DNX12 (GAUZE/BANDAGES/DRESSINGS) ×1 IMPLANT
ELECT CAUTERY BLADE 6.4 (BLADE) ×2 IMPLANT
ELECT REM PT RETURN 9FT ADLT (ELECTROSURGICAL) ×2
ELECTRODE REM PT RTRN 9FT ADLT (ELECTROSURGICAL) ×1 IMPLANT
GAUZE 4X4 16PLY RFD (DISPOSABLE) ×2 IMPLANT
GLOVE BIO SURGEON STRL SZ7 (GLOVE) ×4 IMPLANT
GLOVE INDICATOR 7.5 STRL GRN (GLOVE) ×2 IMPLANT
GOWN STRL REUS W/ TWL LRG LVL3 (GOWN DISPOSABLE) ×1 IMPLANT
GOWN STRL REUS W/ TWL XL LVL3 (GOWN DISPOSABLE) ×2 IMPLANT
GOWN STRL REUS W/TWL LRG LVL3 (GOWN DISPOSABLE) ×1
GOWN STRL REUS W/TWL XL LVL3 (GOWN DISPOSABLE) ×2
GRAFT PROPATEN STD WALL 6X40 (Vascular Products) ×2 IMPLANT
HEMOSTAT SURGICEL 2X3 (HEMOSTASIS) ×4 IMPLANT
IV NS 500ML (IV SOLUTION) ×1
IV NS 500ML BAXH (IV SOLUTION) ×1 IMPLANT
KIT TURNOVER KIT A (KITS) ×2 IMPLANT
LABEL OR SOLS (LABEL) ×2 IMPLANT
LOOP RED MAXI  1X406MM (MISCELLANEOUS) ×1
LOOP VESSEL MAXI 1X406 RED (MISCELLANEOUS) ×1 IMPLANT
LOOP VESSEL MINI 0.8X406 BLUE (MISCELLANEOUS) ×1 IMPLANT
LOOPS BLUE MINI 0.8X406MM (MISCELLANEOUS) ×1
NEEDLE FILTER BLUNT 18X 1/2SAF (NEEDLE) ×1
NEEDLE FILTER BLUNT 18X1 1/2 (NEEDLE) ×1 IMPLANT
NS IRRIG 500ML POUR BTL (IV SOLUTION) ×2 IMPLANT
PACK EXTREMITY (MISCELLANEOUS) ×2 IMPLANT
PAD PREP 24X41 OB/GYN DISP (PERSONAL CARE ITEMS) ×2 IMPLANT
SLING ARM M TX990204 (SOFTGOODS) ×2 IMPLANT
SOLUTION CELL SAVER (CLIP) ×1 IMPLANT
STOCKINETTE STRL 4IN 9604848 (GAUZE/BANDAGES/DRESSINGS) ×2 IMPLANT
SUT GORETEX CV-6TTC-13 36IN (SUTURE) ×4 IMPLANT
SUT MNCRL AB 4-0 PS2 18 (SUTURE) ×2 IMPLANT
SUT PROLENE 6 0 BV (SUTURE) ×8 IMPLANT
SUT SILK 0 SH 30 (SUTURE) ×2 IMPLANT
SUT SILK 2 0 (SUTURE) ×1
SUT SILK 2-0 18XBRD TIE 12 (SUTURE) ×1 IMPLANT
SUT SILK 3 0 (SUTURE) ×1
SUT SILK 3-0 18XBRD TIE 12 (SUTURE) ×1 IMPLANT
SUT SILK 4 0 (SUTURE) ×1
SUT SILK 4-0 18XBRD TIE 12 (SUTURE) ×1 IMPLANT
SUT VIC AB 3-0 SH 27 (SUTURE) ×1
SUT VIC AB 3-0 SH 27X BRD (SUTURE) ×1 IMPLANT
SYR 20ML LL LF (SYRINGE) ×2 IMPLANT
SYR 3ML LL SCALE MARK (SYRINGE) ×2 IMPLANT

## 2019-07-09 NOTE — H&P (Signed)
[facility] Admission History & Physical  MRN : [id number]  Jamie Arias is a 64 y.o. (02-Sep-1955) female who presents with chief complaint of scheduled graft creation.  History of Present Illness:  The patient is a 64 year old female with multiple medical issues (see below) well-known to our service as we have created/maintained her dialysis accesses in the past.  The patient endorses a history of end-stage renal disease currently being maintained on hemodialysis.  Patient had her right upper extremity access removed a few months ago due to sepsis and bacteremia.  The patient is currently being maintained by a PermCath.  Patient has undergone vein mapping which was amenable to creation of the left brachial axillary graft.  Patient denies any issues with her PermCath or dialysis at this time.  Patient presents for a scheduled left upper extremity brachial axillary graft creation.  Current Facility-Administered Medications  Medication Dose Route Frequency Provider Last Rate Last Admin  . 0.9 %  sodium chloride infusion   Intravenous Continuous Alvin Critchley, MD 50 mL/hr at 07/09/19 1137 New Bag at 07/09/19 1137  . ceFAZolin (ANCEF) 1-4 GM/50ML-% IVPB           . ceFAZolin (ANCEF) IVPB 1 g/50 mL premix  1 g Intravenous On Call to [place], [place], NP      . Chlorhexidine Gluconate Cloth 2 % PADS 6 each  6 each Topical Once Kris Hartmann, NP      . dexamethasone (DECADRON) 10 MG/ML injection           . EPINEPHrine (ADRENALIN) 1 MG/ML           . famotidine (PEPCID) tablet 20 mg  20 mg Oral Once Arlyss Repress T, MD      . fentaNYL (SUBLIMAZE) injection 50 mcg  50 mcg Intravenous Q5 Min x 2 PRN Alphonsus Sias, MD   50 mcg at 07/09/19 1156  . lidocaine (PF) (XYLOCAINE) 1 % injection           . midazolam (VERSED) 2 MG/2ML injection           . midazolam (VERSED) injection 1 mg  1 mg Intravenous Q5 Min x 2 PRN Alphonsus Sias, MD       Facility-Administered  Medications Ordered in Other Encounters  Medication Dose Route Frequency Provider Last Rate Last Admin  . ropivacaine (PF) 5 mg/mL (0.5%) (NAROPIN) injection   Peri-NEURAL Anesthesia Intra-op Alphonsus Sias, MD   25 mL at 07/09/19 1206   Past Medical History:  Diagnosis Date  . Anemia   . Anginal pain ([place])   . Arthritis    upper back and neck   . Chronic diastolic CHF (congestive heart failure) ([place])    a. Due to ischemic cardiomyopathy. EF as low as 35%, improved to normal s/p CABG; b. echo 07/06/13: EF 55-60%, no RWMAs, mod TR, trivial pericardial effusion not c/w tamponade physiology;  c. 10/2015 Echo: EF 65%, Gr1 DD, triv AI, mild MR, mildly dil LA, mod TR, PASP 60mmHg.  Marland Kitchen Coronary artery disease    a. NSTEMI 06/2013; b.cath: severe three-vessel CAD w/ EF 30% & mild-mod MR; c. s/p 3 vessel CABG 07/02/13 (LIMA-LAD, SVG-OM, and SVG-RPDA);  d. 10/2015 MV: no ischemia/infarct.  . Diabetes mellitus without complication ([place])   . Diabetic neuropathy ([place])   . Dialysis patient [facility])    MWF  . ESRD (end stage renal disease) ([place])    a. 12/2015 initiated - mwf dialysis.  Marland Kitchen  GERD (gastroesophageal reflux disease)   . Hyperlipidemia   . Hypertension   . Hypothyroidism   . Myocardial infarction ([place]) 2014  . Neuropathy   . Pleural effusion 2015  . Pulmonary hypertension ([place])   . Renal insufficiency   . Wears dentures    full lower   Past Surgical History:  Procedure Laterality Date  . A/V FISTULAGRAM Right 12/17/2016   Procedure: A/V Fistulagram;  Surgeon: Algernon Huxley, MD;  Location: [place] [facility];  Service: Cardiovascular;  Laterality: Right;  . A/V FISTULAGRAM Right 01/07/2017   Procedure: A/V Fistulagram;  Surgeon: Algernon Huxley, MD;  Location: [place] [facility];  Service: Cardiovascular;  Laterality: Right;  . A/V FISTULAGRAM Right 12/03/2017   Procedure: A/V FISTULAGRAM;  Surgeon: Katha Cabal, MD;  Location: [place] [facility];  Service: Cardiovascular;  Laterality:  Right;  . A/V FISTULAGRAM Right 02/23/2019   Procedure: A/V Fistulagram;  Surgeon: Algernon Huxley, MD;  Location: [place] [facility];  Service: Cardiovascular;  Laterality: Right;  . A/V SHUNT INTERVENTION N/A 02/22/2017   Procedure: A/V SHUNT INTERVENTION;  Surgeon: Algernon Huxley, MD;  Location: [place] [facility];  Service: Cardiovascular;  Laterality: N/A;  . AV FISTULA PLACEMENT Right 02/03/2016   Procedure: INSERTION OF ARTERIOVENOUS (AV) GORE-TEX GRAFT ARM ( BRACH / AXILLARY );  Surgeon: Katha Cabal, MD;  Location: [facility];  Service: Vascular;  Laterality: Right;  . [place] REMOVAL Right 03/04/2019   Procedure: REMOVAL OF ARTERIOVENOUS GORETEX GRAFT ([place]);  Surgeon: Algernon Huxley, MD;  Location: [facility];  Service: Vascular;  Laterality: Right;  . CARDIAC CATHETERIZATION    . CATARACT EXTRACTION Bilateral   . CHOLECYSTECTOMY N/A 12/09/2014   Procedure: LAPAROSCOPIC CHOLECYSTECTOMY;  Surgeon: Marlyce [name], MD;  Location: [facility];  Service: General;  Laterality: N/A;  . CORONARY ARTERY BYPASS GRAFT N/A 07/02/2013   Procedure: CORONARY ARTERY BYPASS GRAFTING (CABG);  Surgeon: Ivin [name], MD;  Location: [place];  Service: Open Heart Surgery;  Laterality: N/A;  CABG x three, using left internal mammary artery and right leg greater saphenous vein harvested endoscopically  . ESOPHAGOGASTRODUODENOSCOPY N/A 04/11/2019   Procedure: ESOPHAGOGASTRODUODENOSCOPY (EGD);  Surgeon: Jonathon Bellows, MD;  Location[facility];  Service: Gastroenterology;  Laterality: N/A;  . ESOPHAGOGASTRODUODENOSCOPY (EGD) WITH PROPOFOL N/A 11/24/2015   Procedure: ESOPHAGOGASTRODUODENOSCOPY (EGD) WITH PROPOFOL;  Surgeon: [name], MD;  Location: [place];  Service: Endoscopy;  Laterality: N/A;  Diabetic - insulin  . EYE SURGERY Bilateral    Cataract Extraction with IOL  . INTRAOPERATIVE TRANSESOPHAGEAL ECHOCARDIOGRAM N/A 07/02/2013   Procedure: INTRAOPERATIVE TRANSESOPHAGEAL ECHOCARDIOGRAM;   Surgeon: Ivin [name], MD;  Location: [place];  Service: Open Heart Surgery;  Laterality: N/A;  . PEG PLACEMENT N/A 03/03/2019   Procedure: PERCUTANEOUS ENDOSCOPIC GASTROSTOMY (PEG) PLACEMENT;  Surgeon: Virgel Manifold, MD;  Location: [facility];  Service: Endoscopy;  Laterality: N/A;  . PERIPHERAL VASCULAR CATHETERIZATION Right 12/06/2015   Procedure: Dialysis/Perma Catheter Insertion;  Surgeon: Katha Cabal, MD;  Location: [place] [facility];  Service: Cardiovascular;  Laterality: Right;  . PERIPHERAL VASCULAR THROMBECTOMY Right 09/28/2016   Procedure: Peripheral Vascular Thrombectomy;  Surgeon: Katha Cabal, MD;  Location: [place] [facility];  Service: Cardiovascular;  Laterality: Right;  . PERIPHERAL VASCULAR THROMBECTOMY Right 05/20/2018   Procedure: PERIPHERAL VASCULAR THROMBECTOMY;  Surgeon: Katha Cabal, MD;  Location: [place] [facility];  Service: Cardiovascular;  Laterality: Right;  . PORTA CATH REMOVAL N/A 06/01/2016  Procedure: Porta Cath Removal;  Surgeon: Katha Cabal, MD;  Location: [place] [facility];  Service: Cardiovascular;  Laterality: N/A;  . THORACENTESIS Left 2015   Social History Social History   Tobacco Use  . Smoking status: Never Smoker  . Smokeless tobacco: Never Used  Substance Use Topics  . Alcohol use: No    Alcohol/week: 0.0 standard drinks  . Drug use: No   Family History Family History  Problem Relation Age of Onset  . COPD Mother   . Cancer Mother        Lung  . Pulmonary embolism Father   . Diabetes Father   . Diabetes Paternal Grandfather   . Heart disease Maternal Grandmother   . Colon cancer Neg Hx   . Colon polyps Neg Hx   . Esophageal cancer Neg Hx   . Pancreatic cancer Neg Hx   . Liver disease Neg Hx   No family history of bleeding or clotting disorders, autoimmune disease or porphyria  Allergies  Allergen Reactions  . Nsaids Other (See Comments)    Contraindicated due to kidney disease.  Marland Kitchen  Doxycycline Other (See Comments)    tremor   REVIEW OF SYSTEMS (Negative unless checked)  Constitutional: [] Weight loss  [] Fever  [] Chills Cardiac: [] Chest pain   [] Chest pressure   [] Palpitations   [] Shortness of breath when laying flat   [] Shortness of breath at rest   [x] Shortness of breath with exertion. Vascular:  [] Pain in legs with walking   [] Pain in legs at rest   [] Pain in legs when laying flat   [] Claudication   [] Pain in feet when walking  [] Pain in feet at rest  [] Pain in feet when laying flat   [] History of DVT   [] Phlebitis   [x] Swelling in legs   [] Varicose veins   [] Non-healing ulcers Pulmonary:   [] Uses home oxygen   [] Productive cough   [] Hemoptysis   [] Wheeze  [] COPD   [] Asthma Neurologic:  [] Dizziness  [] Blackouts   [] Seizures   [] History of stroke   [] History of TIA  [] Aphasia   [] Temporary blindness   [] Dysphagia   [] Weakness or numbness in arms   [] Weakness or numbness in legs Musculoskeletal:  [] Arthritis   [] Joint swelling   [] Joint pain   [] Low back pain Hematologic:  [] Easy bruising  [] Easy bleeding   [] Hypercoagulable state   [x] Anemic  [] Hepatitis Gastrointestinal:  [] Blood in stool   [] Vomiting blood  [] Gastroesophageal reflux/heartburn   [] Difficulty swallowing. Genitourinary:  [x] Chronic kidney disease   [] Difficult urination  [] Frequent urination  [] Burning with urination   [] Blood in urine Skin:  [] Rashes   [] Ulcers   [] Wounds Psychological:  [] History of anxiety   []  History of major depression.  Physical Examination  Vitals:   07/09/19 1152 07/09/19 1154 07/09/19 1159 07/09/19 1203  BP: (!) 208/64 (!) 205/85 (!) 183/67 (!) 181/104  Pulse: 61 61 62 63  Resp: 13 15 (!) 7 15  Temp:      TempSrc:      SpO2: 100% 100% 97% 99%   There is no height or weight on file to calculate BMI. Gen: WD/WN, NAD Head: [place]/AT, No temporalis wasting. Prominent temp pulse not noted. Ear/Nose/Throat: Hearing grossly intact, nares w/o erythema or drainage, oropharynx w/o  Erythema/Exudate,  Eyes: Conjunctiva clear, sclera non-icteric Neck: Trachea midline.  No JVD.  Pulmonary:  Good air movement, respirations not labored, no use of accessory muscles.  Cardiac: RRR, normal S1, S2. Vascular:  Vessel Right Left  Radial Palpable Palpable  Ulnar Not Palpable Not Palpable  Brachial Palpable Palpable  Carotid Palpable, without bruit Palpable, without bruit  Gastrointestinal: soft, non-tender/non-distended. No guarding/reflex.  Musculoskeletal: M/S 5/5 throughout.  Extremities without ischemic changes.  No deformity or atrophy.  Neurologic: Sensation grossly intact in extremities.  Symmetrical.  Speech is fluent. Motor exam as listed above. Psychiatric: Judgment intact, Mood & affect appropriate for pt's clinical situation. Dermatologic: No rashes or ulcers noted.  No cellulitis or open wounds. Lymph : No Cervical, Axillary, or Inguinal lymphadenopathy.  CBC Lab Results  Component Value Date   WBC 4.1 07/07/2019   HGB 13.6 07/09/2019   HCT 40.0 07/09/2019   MCV 92.3 07/07/2019   PLT 156 07/07/2019   BMET    Component Value Date/Time   NA 137 07/09/2019 1123   NA 138 06/23/2019 1600   NA 140 03/30/2014 1611   K 4.1 07/09/2019 1123   K 4.3 03/30/2014 1611   CL 97 (L) 07/09/2019 1123   CL 105 03/30/2014 1611   CO2 30 07/07/2019 1056   CO2 29 03/30/2014 1611   GLUCOSE 122 (H) 07/09/2019 1123   GLUCOSE 187 (H) 03/30/2014 1611   BUN 19 07/09/2019 1123   BUN 21 06/23/2019 1600   BUN 21 (H) 03/30/2014 1611   CREATININE 4.00 (H) 07/09/2019 1123   CREATININE 3.14 (H) 09/09/2015 1534   CALCIUM 8.4 (L) 07/07/2019 1056   CALCIUM 9.0 03/30/2014 1611   GFRNONAA 11 (L) 07/07/2019 1056   GFRNONAA 36 (L) 03/30/2014 1611   GFRNONAA >60 07/23/2013 0741   GFRAA 13 (L) 07/07/2019 1056   GFRAA 44 (L) 03/30/2014 1611   GFRAA >60 07/23/2013 0741   Estimated Creatinine Clearance: 14.3 mL/min (A) (by C-G formula based on SCr of 4 mg/dL (H)).  COAG Lab Results   Component Value Date   INR 1.4 (H) 07/07/2019   INR 1.1 03/20/2019   INR 1.4 (H) 03/13/2019    Radiology CT ABDOMEN PELVIS WO CONTRAST  Result Date: 06/29/2019 CLINICAL DATA:  Nausea, vomiting, diarrhea, chest pain since yesterday EXAM: CT ABDOMEN AND PELVIS WITHOUT CONTRAST TECHNIQUE: Multidetector CT imaging of the abdomen and pelvis was performed following the standard protocol without IV contrast. COMPARISON:  06/29/2019, 10/23/2015 FINDINGS: Lower chest: There are small bilateral pleural effusions. Rounded atelectasis again seen within the left lower lobe. Heart is enlarged without pericardial effusion. Hepatobiliary: No focal liver abnormality is seen. Status post cholecystectomy. No biliary dilatation. Pancreas: Unremarkable. No pancreatic ductal dilatation or surrounding inflammatory changes. Spleen: Normal in size without focal abnormality. Adrenals/Urinary Tract: Kidneys are atrophic. Calcifications of the bilateral hila are felt to be vascular. No definite urinary tract calculi or obstruction. Bladder is moderately distended without filling defect. Mild perivesicular fat stranding is nonspecific, and could reflect cystitis in the appropriate setting. Please correlate with urinalysis. The adrenals are unremarkable. Stomach/Bowel: No bowel obstruction or ileus. Normal appendix right lower quadrant. Percutaneous gastrostomy tube is seen within the gastric lumen. Vascular/Lymphatic: Aortic atherosclerosis. No enlarged abdominal or pelvic lymph nodes. Reproductive: Uterus and bilateral adnexa are unremarkable. Other: There is mild diffuse body wall edema. No free intraperitoneal fluid or free gas. Small fat containing umbilical hernia is unchanged. Musculoskeletal: No acute displaced fractures. Reconstructed images demonstrate no additional findings. IMPRESSION: 1. Small bilateral pleural effusions. 2. Mild perivesicular fat stranding is nonspecific, and could reflect cystitis in the appropriate  setting. Please correlate with urinalysis. 3. Mild diffuse body wall edema. 4. Stable rounded atelectasis left lower lobe. 5. Aortic  Atherosclerosis (ICD10-I70.0). Electronically Signed   By: Randa Ngo M.D.   On: 06/29/2019 13:56   DG Chest 2 View  Result Date: 06/29/2019 CLINICAL DATA:  Shortness of breath and chest pain EXAM: CHEST - 2 VIEW COMPARISON:  April 16, 2019 FINDINGS: Central catheter tip is in the superior vena cava. No pneumothorax. There is interstitial edema with small left pleural effusion. No airspace consolidation. There is cardiomegaly with mild pulmonary venous hypertension. Patient is status post coronary artery bypass grafting. No adenopathy. No bone lesions. IMPRESSION: Cardiomegaly with pulmonary vascular congestion. Interstitial edema with left pleural effusion. Appearance is felt to be indicative of a degree of congestive heart failure. No airspace consolidation. Postoperative changes. Central catheter tip in superior vena cava. Electronically Signed   By: Lowella Grip III M.D.   On: 06/29/2019 13:05   [place] OR NERVE BLOCK-IMAGE ONLY [facility])  Result Date: 07/09/2019 There is no interpretation for this exam.  This order is for images obtained during a surgical procedure.  Please See "Surgeries" Tab for more information regarding the procedure.   Assessment/Plan The patient is a 64 year old female with multiple medical issues (see below) well-known to our service as we have created/maintained her dialysis accesses in the past.  1.  End-stage renal disease requiring hemodialysis:   The patient has been maintained by a PermCath status post right upper extremity dialysis graft excision due to sepsis/bacteremia.  The patient has undergone upper extremity vein mapping which was amenable to creation of a left brachial axillary graft.  Patient presents today for planned surgery.  Procedure, risks and benefits explained to the patient.  All questions answered.  The patient  wished to proceed.  The patient will be maintained by her PermCath until her graft has healed.  2.  Hypertension:  Patient will continue medical management; nephrology is following no changes in oral medications.  3. Diabetes mellitus:  Glucose will be monitored and oral medications been held this morning once the patient has undergone the patient's procedure po intake will be reinitiated and again Accu-Cheks will be used to assess the blood glucose level and treat as needed. The patient will be restarted on the patient's usual hypoglycemic regime  4.  Coronary artery disease:  EKG will be monitored. Nitrates will be used if needed. The patient's oral cardiac medications will be continued.  Discussed with Dr. Mayme Genta, PA-C  07/09/2019 12:27 PM

## 2019-07-09 NOTE — Anesthesia Procedure Notes (Addendum)
Anesthesia Regional Block: Supraclavicular block   Pre-Anesthetic Checklist: ,, timeout performed, Correct Patient, Correct Site, Correct Laterality, Correct Procedure, Correct Position, site marked, Risks and benefits discussed,  Surgical consent,  Pre-op evaluation,  At surgeon's request and post-op pain management  Laterality: Upper and Left  Prep: chloraprep       Needles:  Injection technique: Single-shot  Needle Type: Echogenic Stimulator Needle     Needle Length: 10cm  Needle Gauge: 21   Needle insertion depth: 7 cm   Additional Needles:   Procedures: Doppler guided,,,, ultrasound used (permanent image in chart),,,,  Motor weakness within 10 minutes.  Narrative:  Start time: 07/09/2019 12:03 PM End time: 07/09/2019 12:07 PM Injection made incrementally with aspirations every 5 mL.  Performed by: Personally  Anesthesiologist: Alphonsus Sias, MD  Additional Notes: Functioning IV was confirmed and O2 [place]/monitors were applied. Light sedation administered as required, patient responsive throughout. A 143mm 21ga EchoStim needle was used. Sterile prep and drape,hand hygiene and sterile gloves were used.  Negative aspiration and negative test dose prior to incremental administration of local anesthetic. 1% Lidocaine for skin wheal, 4 ml. Total LA: 65ml - 0.5% Ropivicaine/1:200K epi/8mg  decadron. U/S images stored in chart. The patient tolerated the procedure well.

## 2019-07-09 NOTE — H&P (Signed)
[facility] History & Physical Update  The patient was interviewed and re-examined.  The patient's previous History and Physical has been reviewed and is unchanged.  There is no change in the plan of care. We plan to proceed with the scheduled procedure which is a left arm AV graft.  Leotis Pain, MD  07/09/2019, 11:12 AM

## 2019-07-09 NOTE — Anesthesia Preprocedure Evaluation (Addendum)
Anesthesia Evaluation  Patient identified by MRN, date of birth, ID band Patient awake    Reviewed: Allergy & Precautions, H&P , NPO status , reviewed documented beta blocker date and time   Airway Mallampati: III  TM Distance: >3 FB Neck ROM: full    Dental  (+) Edentulous Upper, Edentulous Lower   Pulmonary    Pulmonary exam normal        Cardiovascular hypertension, + angina + CAD, + Past MI and +CHF  Normal cardiovascular exam  02/2019 ECHO IMPRESSIONS    1. Left ventricular ejection fraction, by visual estimation, is 60 to  65%. The left ventricle has normal function. There is mildly increased  left ventricular hypertrophy.  2. Elevated left atrial pressure.  3. Left ventricular diastolic parameters are consistent with Grade II  diastolic dysfunction (pseudonormalization).  4. The left ventricle has no regional wall motion abnormalities.  5. Global right ventricle has mildly reduced systolic function.The right  ventricular size is mildly enlarged. Mildly increased right ventricular  wall thickness.  6. Left atrial size was normal.  7. Right atrial size was normal.  8. Mild mitral annular calcification.  9. The mitral valve is normal in structure. Trace mitral valve  regurgitation. No evidence of mitral stenosis.  10. The tricuspid valve is normal in structure. Tricuspid valve  regurgitation moderate-severe.  11. The aortic valve is tricuspid. Aortic valve regurgitation is trivial.  Mild to moderate aortic valve sclerosis/calcification without any evidence  of aortic stenosis.  12. The pulmonic valve was grossly normal. Pulmonic valve regurgitation is  trivial.  13. Mildly elevated pulmonary artery systolic pressure.  14. The inferior vena cava is normal in size with <50% respiratory  variability, suggesting right atrial pressure of 8 mmHg.    Neuro/Psych PSYCHIATRIC DISORDERS Depression     GI/Hepatic GERD  Controlled,  Endo/Other  diabetesHypothyroidism   Renal/GU Dialysis and ESRFRenal diseaseDialysis yesterday     Musculoskeletal  (+) Arthritis ,   Abdominal   Peds  Hematology  (+) Blood dyscrasia, anemia ,   Anesthesia Other Findings Off Eliquis 3 days  Past Medical History: No date: Anemia No date: Anginal pain (HCC) No date: Arthritis     Comment:  upper back and neck  No date: Chronic diastolic CHF (congestive heart failure) ([place])     Comment:  a. Due to ischemic cardiomyopathy. EF as low as 35%,               improved to normal s/p CABG; b. echo 07/06/13: EF 55-60%,               no RWMAs, mod TR, trivial pericardial effusion not c/w               tamponade physiology;  c. 10/2015 Echo: EF 65%, Gr1 DD,               triv AI, mild MR, mildly dil LA, mod TR, PASP 63mHg. No date: Coronary artery disease     Comment:  a. NSTEMI 06/2013; b.cath: severe three-vessel CAD w/ EF               30% & mild-mod MR; c. s/p 3 vessel CABG 07/02/13               (LIMA-LAD, SVG-OM, and SVG-RPDA);  d. 10/2015 MV: no               ischemia/infarct. No date: Diabetes mellitus without complication (HCC) [place]: [place]  No date: Dialysis patient [facility])     Comment:  MWF No date: ESRD (end stage renal disease) ([place])     Comment:  a. 12/2015 initiated - mwf dialysis. No date: GERD (gastroesophageal reflux disease) No date: Hyperlipidemia No date: Hypertension No date: Hypothyroidism 2014: Myocardial infarction ([place]) No date: Neuropathy 2015: Pleural effusion No date: Pulmonary hypertension (HCC) No date: Renal insufficiency No date: Wears dentures     Comment:  full lower  Past Surgical History: 12/17/2016: A/V FISTULAGRAM; Right     Comment:  Procedure: A/V Fistulagram;  Surgeon: Algernon Huxley, MD;               Location: [place] [facility];  Service: Cardiovascular;              Laterality: Right; 01/07/2017: A/V FISTULAGRAM; Right     Comment:   Procedure: A/V Fistulagram;  Surgeon: Algernon Huxley, MD;               Location: [place] [facility];  Service: Cardiovascular;              Laterality: Right; 12/03/2017: A/V FISTULAGRAM; Right     Comment:  Procedure: A/V FISTULAGRAM;  Surgeon: Katha Cabal, MD;  Location: [place] [facility];  Service:               Cardiovascular;  Laterality: Right; 02/23/2019: A/V FISTULAGRAM; Right     Comment:  Procedure: A/V Fistulagram;  Surgeon: Algernon Huxley, MD;               Location: [place] [facility];  Service: Cardiovascular;              Laterality: Right; 02/22/2017: A/V SHUNT INTERVENTION; N/A     Comment:  Procedure: A/V SHUNT INTERVENTION;  Surgeon: Algernon Huxley, MD;  Location: [place] [facility];  Service:               Cardiovascular;  Laterality: N/A; 02/03/2016: AV FISTULA PLACEMENT; Right     Comment:  Procedure: INSERTION OF ARTERIOVENOUS (AV) GORE-TEX               GRAFT ARM ( BRACH / AXILLARY );  Surgeon: Katha Cabal, MD;  Location: [facility];  Service: Vascular;                Laterality: Right; 03/04/2019: [place] REMOVAL; Right     Comment:  Procedure: REMOVAL OF ARTERIOVENOUS GORETEX GRAFT               ([place]);  Surgeon: Algernon Huxley, MD;  Location: [facility];               Service: Vascular;  Laterality: Right; No date: CARDIAC CATHETERIZATION No date: CATARACT EXTRACTION; Bilateral 12/09/2014: CHOLECYSTECTOMY; N/A     Comment:  Procedure: LAPAROSCOPIC CHOLECYSTECTOMY;  Surgeon:               Marlyce [name], MD;  Location: [facility];  Service:              General;  Laterality: N/A; 07/02/2013: CORONARY ARTERY BYPASS GRAFT; N/A     Comment:  Procedure: CORONARY ARTERY BYPASS GRAFTING (CABG);  Surgeon: Ivin [name], MD;  Location: [place];  Service:              Open Heart Surgery;  Laterality: N/A;  CABG x three,               using left internal mammary artery and right leg greater                saphenous vein harvested endoscopically 04/11/2019: ESOPHAGOGASTRODUODENOSCOPY; N/A     Comment:  Procedure: ESOPHAGOGASTRODUODENOSCOPY (EGD);  Surgeon:               Jonathon Bellows, MD;  Location: [facility];  Service:               Gastroenterology;  Laterality: N/A; 11/24/2015: ESOPHAGOGASTRODUODENOSCOPY (EGD) WITH PROPOFOL; N/A     Comment:  Procedure: ESOPHAGOGASTRODUODENOSCOPY (EGD) WITH               PROPOFOL;  Surgeon: [name], MD;  Location: [place];  Service: Endoscopy;  Laterality: N/A;                Diabetic - insulin No date: EYE SURGERY; Bilateral     Comment:  Cataract Extraction with IOL 07/02/2013: INTRAOPERATIVE TRANSESOPHAGEAL ECHOCARDIOGRAM; N/A     Comment:  Procedure: INTRAOPERATIVE TRANSESOPHAGEAL               ECHOCARDIOGRAM;  Surgeon: Ivin [name], MD;  Location:              [place];  Service: Open Heart Surgery;  Laterality: N/A; 03/03/2019: PEG PLACEMENT; N/A     Comment:  Procedure: PERCUTANEOUS ENDOSCOPIC GASTROSTOMY (PEG)               PLACEMENT;  Surgeon: Virgel Manifold, MD;  Location:              [facility];  Service: Endoscopy;  Laterality: N/A; 12/06/2015: PERIPHERAL VASCULAR CATHETERIZATION; Right     Comment:  Procedure: Dialysis/Perma Catheter Insertion;  Surgeon:               Katha Cabal, MD;  Location: [place] [facility];                Service: Cardiovascular;  Laterality: Right; 09/28/2016: PERIPHERAL VASCULAR THROMBECTOMY; Right     Comment:  Procedure: Peripheral Vascular Thrombectomy;  Surgeon:               Katha Cabal, MD;  Location: [place] [facility];               Service: Cardiovascular;  Laterality: Right; 05/20/2018: PERIPHERAL VASCULAR THROMBECTOMY; Right     Comment:  Procedure: PERIPHERAL VASCULAR THROMBECTOMY;  Surgeon:               Katha Cabal, MD;  Location: [place] [facility];               Service: Cardiovascular;  Laterality: Right; 06/01/2016: PORTA CATH  REMOVAL; N/A     Comment:  Procedure: Porta Cath Removal;  Surgeon: Katha Cabal, MD;  Location: [place] [facility];  Service:               Cardiovascular;  Laterality: N/A; 2015: THORACENTESIS; Left     Reproductive/Obstetrics  Anesthesia Physical Anesthesia Plan  ASA: IV  Anesthesia Plan: MAC and Regional   Post-op Pain Management:  Regional for Post-op pain   Induction: Intravenous  PONV Risk Score and Plan: Treatment may vary due to age or medical condition, TIVA, Midazolam and Ondansetron  Airway Management Planned: Nasal Cannula and Natural Airway  Additional Equipment:   Intra-op Plan:   Post-operative Plan:   Informed Consent: I have reviewed the patients History and Physical, chart, labs and discussed the procedure including the risks, benefits and alternatives for the proposed anesthesia with the patient or authorized representative who has indicated his/her understanding and acceptance.     Dental Advisory Given  Plan Discussed with: CRNA  Anesthesia Plan Comments:      Anesthesia Quick Evaluation

## 2019-07-09 NOTE — Transfer of Care (Signed)
Immediate Anesthesia Transfer of Care Note  Patient: Jamie Arias  Procedure(s) Performed: INSERTION OF ARTERIOVENOUS (AV) GORE-TEX GRAFT ARM ( BRACHIAL AXILLARY ) (Left Arm Upper)  Patient Location: PACU  Anesthesia Type:General and Regional  Level of Consciousness: awake and sedated  Airway & Oxygen Therapy: Patient Spontanous Breathing  Post-op Assessment: Report given to RN  Post vital signs: stable  Last Vitals:  Vitals Value Taken Time  BP    Temp    Pulse 66 07/09/19 1451  Resp 16 07/09/19 1451  SpO2 100 % 07/09/19 1451  Vitals shown include unvalidated device data.  Last Pain:  Vitals:   07/09/19 1117  TempSrc: Temporal  PainSc: 0-No pain         Complications: No apparent anesthesia complications

## 2019-07-09 NOTE — Op Note (Addendum)
[facility]  OPERATIVE NOTE   PROCEDURE:  Left upper arm brachial artery to axillary vein arteriovenous graft with 6 mm Propaten PTFE graft  PRE-OPERATIVE DIAGNOSIS: 1. end stage renal disease  2. Previous right arm AVG infection  POST-OPERATIVE DIAGNOSIS: same  SURGEON: Leotis Pain  ASSISTANT(S): Hezzie Bump, PA-C  ANESTHESIA: general  ESTIMATED BLOOD LOSS: 25 cc  FINDING(S): 1. none  SPECIMEN(S):  None  INDICATIONS:   Jamie Arias is a 64 y.o. female who presents with end stage renal disease and failure due to infection of a previous right arm access.  Risk, benefits, and alternatives to access surgery were discussed.  The patient is aware the risks include but are not limited to: bleeding, infection, steal syndrome, nerve damage, ischemic monomelic neuropathy, failure to mature, and need for additional procedures.  The patient is aware of the risks and elects to proceed forward. An assistant was present during the procedure to help facilitate the exposure and expedite the procedure.  DESCRIPTION: After full informed written consent was obtained from the patient, the patient was brought back to the operating room and placed supine upon the operating table.  The patient was given IV antibiotics prior to proceeding.  After obtaining adequate sedation, the patient was prepped and draped in standard fashion for a left arm access procedure. The assistant provided retraction and mobilization to help facilitate exposure and expedite the procedure throughout the entire procedure.  This included following suture, using retractors, and optimizing lighting.  I turned my attention first to the antecubitum.   I made an incision over the brachial artery, and dissected down through the subcutaneous tissue to the fascia carefully and was able to dissect out the brachial artery.  This was fairly deep, as there was a superficial artery that was palpable was quite small.  We  had to dissect down through the muscle to get down to the true brachial artery which was fairly deep.  It is likely she had a high brachial bifurcation or this was just a reasonable sized arterial branch, but either way the deep artery was the option that would support a graft.  The artery was about patent and of adequate size to support a graft.  It was controlled proximally and distally with vessel loops and then I turned my attention to the high bicipital groove in the axilla.  I made an incision and dissected down through the subcutaneous tissue and fascia until I reached the axillary vein.  It was patent and adequate size for graft creation.  I then dissected this vein proximally and distal and prepared it for control with Bulldog clamps.  I took a Dietitian and dissected from the antecubital up to the axillary incision.  Then I delivered the 6 mm Propaten pTFE graft, through this metal tunneler and then pulled out the metal tunneler leaving the graft in place and making sure the line was up for orientation.  I then gave the patient 3000 units of heparin to gain some anticoagulation.  After waiting 3 minutes, I placed the brachial artery under tension proximally and distally with vessel loops, made an arteriotomy and extended it with a Potts scissor.  I sewed the graft to this arteriotomy with a running stitch of CV-6 suture.  At this point, then I completed the anastomosis in the usual fashion.  I released the vessel loops on the inflow and allowed the artery to decompress through the graft. There was good pulsatile bleeding through  this graft.  I clamped the graft near its arterial anastomosis and sucked out all the blood in the graft and loaded the graft with heparinized saline.  At this point, I pulled the graft to appropriate length and reset my exposure of the axillary vein.  Then, I controlled the vein with Bulldog clamps.  An anterior wall venotomy was created with an 11 blade and extended  with Potts scissors.  I then spatulated the graft to facilitate an end-to- side anastomosis matching the arteriotomy.  In the process of spatulating, I cut the graft to appropriate length for this anastomosis.  This graft was sewn to the vein in an end-to-side configuration with a running CV-6 suture.  Prior to completing this anastomosis, I allowed the vein to back bleed and then I also allowed the artery to bleed in an antegrade fashion.  I completed this anastomosis in the usual fashion, and then irrigated out the high bicipital exposure and then placed Surgicel and Evicel.  I then turned my attention back to the antecubitum.  There was pulsatile flow in the artery beyond the graft.   At this point, I washed out the antecubital incision. Surgicel and Evicel were then placed. There was no more active bleeding.  The subcutaneous tissue was reapproximated with a running stitch of 3-0 Vicryl.  The skin was then reapproximated with a running subcuticular 4-0 Monocryl.  The skin was then cleaned, dried, and Dermabond used to reinforce the skin closure.  We then turned our attention to the axillary incision.  The subcutaneous tissue was repaired with running stitch of 3-0 Vicryl.  The skin was then reapproximated with running subcuticular 4-0 Monocryl.  The skin was then cleaned, dried, and then the skin closure was reinforced with Dermabond.  The patient was then awakened from anesthesia and taken to the recovery room in stable condition having tolerated the procedure well.    COMPLICATIONS: None  CONDITION: Stable   Leotis Pain 07/09/2019 2:30 PM   This note was created with Dragon Medical transcription system. Any errors in dictation are purely unintentional.

## 2019-07-09 NOTE — Discharge Instructions (Signed)

## 2019-07-10 NOTE — Anesthesia Postprocedure Evaluation (Signed)
Anesthesia Post Note  Patient: Jamie Arias  Procedure(s) Performed: INSERTION OF ARTERIOVENOUS (AV) GORE-TEX GRAFT ARM ( BRACHIAL AXILLARY ) (Left Arm Upper)  Patient location during evaluation: PACU Anesthesia Type: Regional Level of consciousness: awake and alert Pain management: pain level controlled Vital Signs Assessment: post-procedure vital signs reviewed and stable Respiratory status: spontaneous breathing, nonlabored ventilation, respiratory function stable and patient connected to nasal cannula oxygen Cardiovascular status: blood pressure returned to baseline and stable Postop Assessment: no apparent nausea or vomiting Anesthetic complications: no     Last Vitals:  Vitals:   07/09/19 1624 07/09/19 1711  BP: (!) 155/46 110/66  Pulse: 69 73  Resp: 16 16  Temp: 36.8 C   SpO2: 100% 100%    Last Pain:  Vitals:   07/09/19 1711  TempSrc:   PainSc: 0-No pain                 Alphonsus Sias

## 2019-07-14 ENCOUNTER — Encounter: Payer: Self-pay | Admitting: Gastroenterology

## 2019-07-14 ENCOUNTER — Ambulatory Visit: Payer: Medicare Other | Admitting: Gastroenterology

## 2019-07-14 ENCOUNTER — Other Ambulatory Visit (INDEPENDENT_AMBULATORY_CARE_PROVIDER_SITE_OTHER): Payer: Self-pay | Admitting: Nurse Practitioner

## 2019-07-14 ENCOUNTER — Telehealth (INDEPENDENT_AMBULATORY_CARE_PROVIDER_SITE_OTHER): Payer: Self-pay | Admitting: Vascular Surgery

## 2019-07-14 DIAGNOSIS — Z8616 Personal history of COVID-19: Secondary | ICD-10-CM | POA: Insufficient documentation

## 2019-07-14 DIAGNOSIS — R5383 Other fatigue: Secondary | ICD-10-CM | POA: Insufficient documentation

## 2019-07-14 NOTE — Telephone Encounter (Signed)
She is about a week out after surgery so swelling and discomfort is normal.  Some bruising and redness are normal as long as the patient doesn't have any purulent drainage and the redness isn't a fire engine type of red.  The patient should elevate her arm about her heart.  She should use the pain medication that she was discharged with as well.

## 2019-07-14 NOTE — Telephone Encounter (Signed)
Patient husband has made aware with medical advice and verbalized understanding

## 2019-07-21 ENCOUNTER — Telehealth: Payer: Self-pay | Admitting: Family Medicine

## 2019-07-21 NOTE — Telephone Encounter (Signed)
Left message for patient to call back and schedule Medicare Annual Wellness Visit (AWV) either virtually or audio only.  No hx of AWV; please schedule at anytime with Denisa O'Brien-Blaney at [facility]  Started Part B [facility] 03/05/16

## 2019-07-23 ENCOUNTER — Telehealth (INDEPENDENT_AMBULATORY_CARE_PROVIDER_SITE_OTHER): Payer: Self-pay

## 2019-07-23 NOTE — Telephone Encounter (Signed)
Can you call her in 20 tramadol

## 2019-07-23 NOTE — Telephone Encounter (Signed)
Rx has been called into pharmacy  

## 2019-07-28 ENCOUNTER — Ambulatory Visit (INDEPENDENT_AMBULATORY_CARE_PROVIDER_SITE_OTHER): Payer: BLUE CROSS/BLUE SHIELD | Admitting: Vascular Surgery

## 2019-07-30 ENCOUNTER — Encounter: Payer: Self-pay | Admitting: Cardiovascular Disease

## 2019-07-30 ENCOUNTER — Other Ambulatory Visit: Payer: Self-pay

## 2019-07-30 ENCOUNTER — Ambulatory Visit (INDEPENDENT_AMBULATORY_CARE_PROVIDER_SITE_OTHER): Payer: Medicare Other | Admitting: Cardiovascular Disease

## 2019-07-30 VITALS — BP 150/70 | HR 60 | Ht 64.0 in | Wt 175.0 lb

## 2019-07-30 DIAGNOSIS — I480 Paroxysmal atrial fibrillation: Secondary | ICD-10-CM

## 2019-07-30 DIAGNOSIS — I5032 Chronic diastolic (congestive) heart failure: Secondary | ICD-10-CM

## 2019-07-30 DIAGNOSIS — I25709 Atherosclerosis of coronary artery bypass graft(s), unspecified, with unspecified angina pectoris: Secondary | ICD-10-CM

## 2019-07-30 DIAGNOSIS — E785 Hyperlipidemia, unspecified: Secondary | ICD-10-CM

## 2019-07-30 NOTE — Progress Notes (Signed)
Cardiology Office Note   Date:  07/30/2019   ID:  Jamie Arias, DOB 1955-06-08, MRN [id number]  PCP:  [name], MD  Cardiologist:   Kathlyn Sacramento, MD   Chief Complaint  Patient presents with  . office visit    Pt has no complaints. Meds verbally reviewed w/ pt.       History of Present Illness: Jamie Arias is a 64 y.o. female who presents for a followup regarding coronary artery disease status post CABG and chronic diastolic heart failure. She has known history of diabetes, end-stage renal disease on hemodialysis, anemia, hypertension and hyperlipidemia.  She is status post CABG in April 2015.    She was started on dialysis in October 2017.  Dialysis days are Mondays, Wednesdays and Fridays.  Ejection fraction was previously reduced but most recent echocardiogram in August 2017 showed normal LV systolic function, mild mitral regurgitation and mild pulmonary hypertension.  The patient has issues with orthostatic hypotension which has been managed with midodrine with partial improvement.  She is also having problems with her balance and she is seeing Dr. Manuella Ghazi in neurology.  She is using the wheelchair most of the time.  She has known history of severe hyperlipidemia and did not respond well to Zetia.  She was on Praluent at some point but it was discontinued due to frequent prolonged hospitalizations.    She had multiple hospitalizations over the last year.  She had COVID-19 infection in July 2020.  She had prolonged hospitalization for MRSA bacteremia with encephalopathy.  She was diagnosed with atrial fibrillation with RVR in June of last year and was placed on amiodarone and Eliquis for anticoagulation.  She is doing reasonably well with no chest pain or significant dyspnea.  She is mostly wheelchair-bound.  No palpitations. She had a recent dialysis graft placement in the left arm. Most recent echocardiogram in December 2020 showed an EF of 60 to 65% with grade 2  diastolic dysfunction.  Past Medical History:  Diagnosis Date  . Anemia   . Anginal pain ([place])   . Arthritis    upper back and neck   . Chronic diastolic CHF (congestive heart failure) ([place])    a. Due to ischemic cardiomyopathy. EF as low as 35%, improved to normal s/p CABG; b. echo 07/06/13: EF 55-60%, no RWMAs, mod TR, trivial pericardial effusion not c/w tamponade physiology;  c. 10/2015 Echo: EF 65%, Gr1 DD, triv AI, mild MR, mildly dil LA, mod TR, PASP 59mmHg.  Marland Kitchen Coronary artery disease    a. NSTEMI 06/2013; b.cath: severe three-vessel CAD w/ EF 30% & mild-mod MR; c. s/p 3 vessel CABG 07/02/13 (LIMA-LAD, SVG-OM, and SVG-RPDA);  d. 10/2015 MV: no ischemia/infarct.  . Diabetes mellitus without complication ([place])   . Diabetic neuropathy ([place])   . Dialysis patient [facility])    MWF  . ESRD (end stage renal disease) ([place])    a. 12/2015 initiated - mwf dialysis.  Marland Kitchen GERD (gastroesophageal reflux disease)   . Hyperlipidemia   . Hypertension   . Hypothyroidism   . Myocardial infarction ([place]) 2014  . Neuropathy   . Pleural effusion 2015  . Pulmonary hypertension ([place])   . Renal insufficiency   . Wears dentures    full lower    Past Surgical History:  Procedure Laterality Date  . A/V FISTULAGRAM Right 12/17/2016   Procedure: A/V Fistulagram;  Surgeon: Algernon Huxley, MD;  Location: [place] [facility];  Service: Cardiovascular;  Laterality:  Right;  Marland Kitchen A/V FISTULAGRAM Right 01/07/2017   Procedure: A/V Fistulagram;  Surgeon: Algernon Huxley, MD;  Location: [place] [facility];  Service: Cardiovascular;  Laterality: Right;  . A/V FISTULAGRAM Right 12/03/2017   Procedure: A/V FISTULAGRAM;  Surgeon: Katha Cabal, MD;  Location: [place] [facility];  Service: Cardiovascular;  Laterality: Right;  . A/V FISTULAGRAM Right 02/23/2019   Procedure: A/V Fistulagram;  Surgeon: Algernon Huxley, MD;  Location: [place] [facility];  Service: Cardiovascular;  Laterality: Right;  . A/V SHUNT INTERVENTION N/A  02/22/2017   Procedure: A/V SHUNT INTERVENTION;  Surgeon: Algernon Huxley, MD;  Location: [place] [facility];  Service: Cardiovascular;  Laterality: N/A;  . AV FISTULA PLACEMENT Right 02/03/2016   Procedure: INSERTION OF ARTERIOVENOUS (AV) GORE-TEX GRAFT ARM ( BRACH / AXILLARY );  Surgeon: Katha Cabal, MD;  Location: [facility];  Service: Vascular;  Laterality: Right;  . AV FISTULA PLACEMENT Left 07/09/2019   Procedure: INSERTION OF ARTERIOVENOUS (AV) GORE-TEX GRAFT ARM ( BRACHIAL AXILLARY );  Surgeon: Algernon Huxley, MD;  Location: [facility];  Service: Vascular;  Laterality: Left;  . [place] REMOVAL Right 03/04/2019   Procedure: REMOVAL OF ARTERIOVENOUS GORETEX GRAFT ([place]);  Surgeon: Algernon Huxley, MD;  Location: [facility];  Service: Vascular;  Laterality: Right;  . CARDIAC CATHETERIZATION    . CATARACT EXTRACTION Bilateral   . CHOLECYSTECTOMY N/A 12/09/2014   Procedure: LAPAROSCOPIC CHOLECYSTECTOMY;  Surgeon: Marlyce [name], MD;  Location: [facility];  Service: General;  Laterality: N/A;  . CORONARY ARTERY BYPASS GRAFT N/A 07/02/2013   Procedure: CORONARY ARTERY BYPASS GRAFTING (CABG);  Surgeon: Ivin [name], MD;  Location: [place];  Service: Open Heart Surgery;  Laterality: N/A;  CABG x three, using left internal mammary artery and right leg greater saphenous vein harvested endoscopically  . ESOPHAGOGASTRODUODENOSCOPY N/A 04/11/2019   Procedure: ESOPHAGOGASTRODUODENOSCOPY (EGD);  Surgeon: Jonathon Bellows, MD;  Location: [facility];  Service: Gastroenterology;  Laterality: N/A;  . ESOPHAGOGASTRODUODENOSCOPY (EGD) WITH PROPOFOL N/A 11/24/2015   Procedure: ESOPHAGOGASTRODUODENOSCOPY (EGD) WITH PROPOFOL;  Surgeon: [name], MD;  Location: [place];  Service: Endoscopy;  Laterality: N/A;  Diabetic - insulin  . EYE SURGERY Bilateral    Cataract Extraction with IOL  . INTRAOPERATIVE TRANSESOPHAGEAL ECHOCARDIOGRAM N/A 07/02/2013   Procedure: INTRAOPERATIVE TRANSESOPHAGEAL ECHOCARDIOGRAM;   Surgeon: Ivin [name], MD;  Location: [place];  Service: Open Heart Surgery;  Laterality: N/A;  . PEG PLACEMENT N/A 03/03/2019   Procedure: PERCUTANEOUS ENDOSCOPIC GASTROSTOMY (PEG) PLACEMENT;  Surgeon: Virgel Manifold, MD;  Location: [facility] ENDOSCOPY;  Service: Endoscopy;  Laterality: N/A;  . PERIPHERAL VASCULAR CATHETERIZATION Right 12/06/2015   Procedure: Dialysis/Perma Catheter Insertion;  Surgeon: Katha Cabal, MD;  Location: [place] [facility];  Service: Cardiovascular;  Laterality: Right;  . PERIPHERAL VASCULAR THROMBECTOMY Right 09/28/2016   Procedure: Peripheral Vascular Thrombectomy;  Surgeon: Katha Cabal, MD;  Location: [place] [facility];  Service: Cardiovascular;  Laterality: Right;  . PERIPHERAL VASCULAR THROMBECTOMY Right 05/20/2018   Procedure: PERIPHERAL VASCULAR THROMBECTOMY;  Surgeon: Katha Cabal, MD;  Location: [place] [facility];  Service: Cardiovascular;  Laterality: Right;  . PORTA CATH REMOVAL N/A 06/01/2016   Procedure: Glori [name] Cath Removal;  Surgeon: Katha Cabal, MD;  Location: [place] [facility];  Service: Cardiovascular;  Laterality: N/A;  . THORACENTESIS Left 2015     Current Outpatient Medications  Medication Sig Dispense Refill  . acetaminophen (TYLENOL) 325 MG tablet Take 650 mg by  mouth daily as needed for moderate pain or headache.     Marland Kitchen atorvastatin (LIPITOR) 40 MG tablet Take 40 mg by mouth daily.    Marland Kitchen ELIQUIS 5 MG TABS tablet Take 5 mg by mouth 2 (two) times daily.     . ferrous gluconate (FERGON) 324 MG tablet Take 1 tablet (324 mg total) by mouth daily.  3  . FLUoxetine (PROZAC) 20 MG capsule Take 1 capsule (20 mg total) by mouth daily. 90 capsule 1  . fluticasone (FLONASE) 50 MCG/ACT nasal spray Place 2 sprays into both nostrils daily. (Patient taking differently: Place 2 sprays into both nostrils daily as needed for allergies or rhinitis. ) 16 g 6  . folic acid (FOLVITE) 1 MG tablet Take 1 tablet (1 mg total) by mouth  daily.    Marland Kitchen gabapentin (NEURONTIN) 100 MG capsule Take 100 mg by mouth 3 (three) times daily.    . hydrALAZINE (APRESOLINE) 10 MG tablet Take 1 tablet (10 mg total) by mouth in the morning and at bedtime. 180 tablet 0  . HYDROcodone-acetaminophen (NORCO) 5-325 MG tablet Take 1 tablet by mouth every 6 (six) hours as needed for moderate pain. 30 tablet 0  . hydrocortisone cream 1 % Apply topically as needed for itching. (Patient not taking: Reported on 07/09/2019) 30 g 0  . ipratropium-albuterol (DUONEB) 0.5-2.5 (3) MG/3ML SOLN Take 3 mLs by nebulization every 4 (four) hours as needed. 360 mL   . levothyroxine (SYNTHROID) 88 MCG tablet Take 88 mcg by mouth daily before breakfast.    . Melatonin 3 MG TABS Take 3 mg by mouth at bedtime as needed (sleep).     . multivitamin (RENA-VIT) TABS tablet Take 1 tablet by mouth at bedtime.  0  . nitroGLYCERIN (NITROSTAT) 0.4 MG SL tablet DISSOLVE ONE TABLET UNDER THE TONGUE EVERY 5 MINUTES AS NEEDED FOR CHEST PAIN.  DO NOT EXCEED A TOTAL OF 3 DOSES IN 15 MINUTES (Patient taking differently: Place 0.4 mg under the tongue every 5 (five) minutes as needed for chest pain. DISSOLVE ONE TABLET UNDER THE TONGUE EVERY 5 MINUTES AS NEEDED FOR CHEST PAIN.  DO NOT EXCEED A TOTAL OF 3 DOSES IN 15 MINUTES) 25 tablet 0  . nystatin (MYCOSTATIN/NYSTOP) powder Apply 1 application topically 2 (two) times daily. (apply to abdomen and/or under breasts)    . ondansetron (ZOFRAN-ODT) 4 MG disintegrating tablet Take 1 tablet (4 mg total) by mouth every 8 (eight) hours as needed for nausea or vomiting. appt with PCP further refills Dr. Caryl Bis 30 tablet 0  . PACERONE 200 MG tablet Take 200 mg by mouth daily.    . pantoprazole sodium (PROTONIX) 40 mg/20 mL PACK Place 20 mLs (40 mg total) into feeding tube 2 (two) times daily. 30 mL   . senna-docusate (SENOKOT-S) 8.6-50 MG tablet Take 1 tablet by mouth at bedtime.    . sodium zirconium cyclosilicate (LOKELMA) 10 g PACK packet Take 10 g by  mouth daily.    . traZODone (DESYREL) 50 MG tablet Take 50 mg by mouth at bedtime.    . vitamin C (VITAMIN C) 500 MG tablet Take 1 tablet (500 mg total) by mouth daily.    Marland Kitchen zinc sulfate 220 (50 Zn) MG capsule Take 1 capsule (220 mg total) by mouth daily.     No current facility-administered medications for this visit.    Allergies:   Nsaids, 5-alpha reductase inhibitors, and Doxycycline    Social History:  The patient  reports that she  has never smoked. She has never used smokeless tobacco. She reports that she does not drink alcohol or use drugs.   Family History:  The patient's family history includes COPD in her mother; Cancer in her mother; Diabetes in her father and paternal grandfather; Heart disease in her maternal grandmother; Pulmonary embolism in her father.    ROS:  Please see the history of present illness.   Otherwise, review of systems are positive for none.   All other systems are reviewed and negative.    PHYSICAL EXAM: VS:  BP (!) 150/70 (BP Location: Right Arm, Patient Position: Sitting, Cuff Size: Normal)   Pulse 60   Ht 5\' 4"  (1.626 m)   Wt 175 lb (79.4 kg)   SpO2 96%   BMI 30.04 kg/m  , BMI Body mass index is 30.04 kg/m. GEN: Well nourished, well developed, in no acute distress  HEENT: normal  Neck: no JVD, carotid bruits, or masses Cardiac: RRR; no murmurs, rubs, or gallops, trace edema  Respiratory:  clear to auscultation bilaterally, normal work of breathing GI: soft, nontender, nondistended, + BS MS: no deformity or atrophy  Skin: warm and dry, no rash Neuro:  Strength and sensation are intact Psych: euthymic mood, full affect   EKG:  EKG is ordered today. EKG showed normal sinus rhythm with low voltage.   Recent Labs: 03/01/2019: B Natriuretic Peptide 720.0 03/15/2019: TSH 17.828 03/18/2019: Magnesium 1.7 06/29/2019: ALT 12 07/07/2019: Platelets 156 07/09/2019: BUN 19; Creatinine, Ser 4.00; Hemoglobin 13.6; Potassium 4.1; Sodium 137    Lipid  Panel    Component Value Date/Time   CHOL 167 06/30/2019 0554   CHOL 153 02/11/2018 1404   TRIG 110 06/30/2019 0554   HDL 53 06/30/2019 0554   HDL 43 02/11/2018 1404   CHOLHDL 3.2 06/30/2019 0554   VLDL 22 06/30/2019 0554   LDLCALC 92 06/30/2019 0554   LDLCALC 51 02/11/2018 1404   LDLDIRECT 60 02/11/2018 1404   LDLDIRECT 126.9 10/31/2012 0801      Wt Readings from Last 3 Encounters:  07/30/19 175 lb (79.4 kg)  07/02/19 175 lb (79.4 kg)  06/30/19 167 lb 11.2 oz (76.1 kg)       ASSESSMENT AND PLAN:  1.  Chronic diastolic heart failure: She appears to be euvolemic.  Fluid management is done via dialysis.  2. Coronary artery disease involving native coronary arteries without angina: She is overall doing reasonably well.  Continue medical Therapy.  Given that she is on Eliquis, I discontinued aspirin.  3.  Paroxysmal atrial fibrillation: She had atrial fibrillation with RVR in the setting of acute illness and it appears that she has been on amiodarone since then and anticoagulation with Eliquis.  They are not entirely sure if the patient is still taking amiodarone or not as they did not recognize the medication.  They will check at home and call us back.  If she is still on amiodarone, I plan on decreasing the dose of 100 mg daily.  Continue anticoagulation with Eliquis.  4.  Hyperlipidemia: Currently on atorvastatin 40 mg daily.  Most recent lipid profile in April showed an LDL of 92.  Disposition:   FU with me in 4 months  Signed,  Kathlyn Sacramento, MD  07/30/2019 3:33 PM    [place]

## 2019-07-30 NOTE — Patient Instructions (Signed)
Medication Instructions:  1- STOP Aspirin *If you need a refill on your cardiac medications before your next appointment, please call your pharmacy*   Lab Work: None ordered  If you have labs (blood work) drawn today and your tests are completely normal, you will receive your results only by: Marland Kitchen MyChart Message (if you have MyChart) OR . A paper copy in the mail If you have any lab test that is abnormal or we need to change your treatment, we will call you to review the results.   Testing/Procedures: None ordered    Follow-Up: At [facility] you and your health needs are our priority.  As part of our continuing mission to provide you with exceptional heart care, we have created designated Provider Care Teams.  These Care Teams include your primary Cardiologist (physician) and Advanced Practice Providers (APPs -  Physician Assistants and Nurse Practitioners) who all work together to provide you with the care you need, when you need it.  We recommend signing up for the patient portal called "MyChart".  Sign up information is provided on this After Visit Summary.  MyChart is used to connect with patients for Virtual Visits (Telemedicine).  Patients are able to view lab/test results, encounter notes, upcoming appointments, etc.  Non-urgent messages can be sent to your provider as well.   To learn more about what you can do with MyChart, go to [email or web address].    Your next appointment:   4 month(s)  The format for your next appointment:   In Person  Provider:    You may see Kathlyn Sacramento, MD or one of the following Advanced Practice Providers on your designated Care Team:    Murray Hodgkins, NP  Christell Faith, PA-C  Marrianne Mood, PA-C    Other Instructions Please call the office to confirm whether you are currently taking amiodarone!!

## 2019-07-31 ENCOUNTER — Telehealth: Payer: Self-pay | Admitting: Cardiovascular Disease

## 2019-07-31 NOTE — Telephone Encounter (Signed)
Patients husband calling in to state patient is NOT taking Amiodarone. Dr. Fletcher Anon asked patient to call back and make him aware

## 2019-08-03 ENCOUNTER — Other Ambulatory Visit: Payer: Self-pay | Admitting: Family Medicine

## 2019-08-03 DIAGNOSIS — N186 End stage renal disease: Secondary | ICD-10-CM | POA: Diagnosis not present

## 2019-08-03 DIAGNOSIS — Z992 Dependence on renal dialysis: Secondary | ICD-10-CM | POA: Diagnosis not present

## 2019-08-04 ENCOUNTER — Ambulatory Visit: Payer: Medicare Other | Admitting: Gastroenterology

## 2019-08-04 ENCOUNTER — Ambulatory Visit (INDEPENDENT_AMBULATORY_CARE_PROVIDER_SITE_OTHER): Payer: Medicare Other | Admitting: Nurse Practitioner

## 2019-08-04 NOTE — Telephone Encounter (Signed)
DPR on file. lmom with Dr. Tyrell Antonio response. Patients medication list updated.

## 2019-08-04 NOTE — Telephone Encounter (Signed)
Okay.  we do not need to resume amiodarone. Please remove the medication from her list.

## 2019-08-06 ENCOUNTER — Ambulatory Visit (INDEPENDENT_AMBULATORY_CARE_PROVIDER_SITE_OTHER): Payer: Medicare Other | Admitting: Nurse Practitioner

## 2019-08-06 ENCOUNTER — Telehealth: Payer: Self-pay | Admitting: Family Medicine

## 2019-08-06 NOTE — Telephone Encounter (Signed)
Jamie Arias with [facility] called and states that pt's oxygen is in the 80s while seated. Pt has not felt good or been to dialysis this week. I transferred to [facility] to triage.

## 2019-08-06 NOTE — Telephone Encounter (Signed)
Agree with need for ED evaluation. I suspect she will be admitted.

## 2019-08-06 NOTE — Telephone Encounter (Signed)
After talking to [employer], I instructed to go to ED. Patient is having diarrhea for the past 2-3 days, has not had dialysis since last week.  Patients o2 is mid 70's currently at rest. Due to patient medical history and sx's is why I instructed her to go to ED.

## 2019-08-07 ENCOUNTER — Other Ambulatory Visit (INDEPENDENT_AMBULATORY_CARE_PROVIDER_SITE_OTHER): Payer: Self-pay | Admitting: Nurse Practitioner

## 2019-08-07 ENCOUNTER — Telehealth: Payer: Self-pay | Admitting: Family Medicine

## 2019-08-07 MED ORDER — ONDANSETRON 4 MG PO TBDP: 4.0000 mg | ORAL_TABLET | Freq: Three times a day (TID) | ORAL | 0 refills | Status: DC | PRN

## 2019-08-07 MED ORDER — TRAZODONE HCL 50 MG PO TABS: 50.0000 mg | ORAL_TABLET | Freq: Every day | ORAL | 0 refills | Status: DC

## 2019-08-07 MED ORDER — PACERONE 200 MG PO TABS: 200.0000 mg | ORAL_TABLET | Freq: Every day | ORAL | 0 refills | Status: DC

## 2019-08-07 NOTE — Telephone Encounter (Signed)
Pt needs refill on traZODone (DESYREL) 50 MG tablet and ondansetron (ZOFRAN-ODT) 4 MG disintegrating tablet and PACERONE 200 MG tablet. Please send to [facility] on [address]. Pt is out of medications

## 2019-08-11 ENCOUNTER — Ambulatory Visit (INDEPENDENT_AMBULATORY_CARE_PROVIDER_SITE_OTHER): Payer: Medicare Other | Admitting: Nurse Practitioner

## 2019-08-18 ENCOUNTER — Ambulatory Visit (INDEPENDENT_AMBULATORY_CARE_PROVIDER_SITE_OTHER): Payer: Medicare Other | Admitting: Nurse Practitioner

## 2019-08-18 ENCOUNTER — Other Ambulatory Visit: Payer: Self-pay

## 2019-08-18 ENCOUNTER — Encounter (INDEPENDENT_AMBULATORY_CARE_PROVIDER_SITE_OTHER): Payer: Self-pay | Admitting: Nurse Practitioner

## 2019-08-18 VITALS — BP 145/67 | HR 63 | Ht 64.0 in | Wt 175.0 lb

## 2019-08-18 DIAGNOSIS — I10 Essential (primary) hypertension: Secondary | ICD-10-CM

## 2019-08-18 DIAGNOSIS — N186 End stage renal disease: Secondary | ICD-10-CM

## 2019-08-18 MED ORDER — TRAMADOL HCL 50 MG PO TABS: 50.0000 mg | ORAL_TABLET | Freq: Two times a day (BID) | ORAL | 0 refills | Status: DC | PRN

## 2019-08-20 ENCOUNTER — Encounter (INDEPENDENT_AMBULATORY_CARE_PROVIDER_SITE_OTHER): Payer: Self-pay | Admitting: Nurse Practitioner

## 2019-08-20 NOTE — Progress Notes (Signed)
Subjective:    [name], female    DOB: 05-Jun-1955, 64 y.o.   MRN: [id number] Chief Complaint  Patient presents with  . Follow-up    2-3 wk [facility] post insertion of Ateriovenous [place] graft ARM     Patient presents today following left brachial axillary graft placement on 07/09/2019.  Today the incision is mostly healed however the patient does still have some significant edema especially in the proximal portion.  She also reports having pain in her upper extremity extending from the graft area down her hand.  Patient also experiences some numbness as well.  The patient denies any discoloration or ulceration or cool feeling of her upper extremity.  She denies any loss of motor function.  She denies any fever, chills, nausea, vomiting or diarrhea postoperatively.  Patient still has some overall weakness following her hospitalization several months ago.   Review of Systems  Cardiovascular:       Upper extremity swelling and discomfort  Neurological: Positive for weakness.  All other systems reviewed and are negative.      Objective:   Physical Exam Vitals reviewed.  HENT:     Head: Normocephalic.  Cardiovascular:     Rate and Rhythm: Normal rate and regular rhythm.     Pulses:          Radial pulses are 2+ on the right side and 2+ on the left side.     Arteriovenous access: left arteriovenous access is present.    Comments: Left brachial axillary graft, good thrill and bruit however swelling significant around the proximal portion. Pulmonary:     Effort: Pulmonary effort is normal.     Breath sounds: Normal breath sounds.  Skin:    General: Skin is warm.  Neurological:     Mental Status: She is alert and oriented to person, place, and time.     Motor: Weakness present.  Psychiatric:        Mood and Affect: Mood normal.        Behavior: Behavior normal.        Thought Content: Thought content normal.        Judgment: Judgment normal.     BP (!) 145/67    Pulse 63   Ht 5\' 4"  (1.626 m)   Wt 175 lb (79.4 kg)   BMI 30.04 kg/m   Past Medical History:  Diagnosis Date  . Anemia   . Anginal pain ([place])   . Arthritis    upper back and neck   . Chronic diastolic CHF (congestive heart failure) ([place])    a. Due to ischemic cardiomyopathy. EF as low as 35%, improved to normal s/p CABG; b. echo 07/06/13: EF 55-60%, no RWMAs, mod TR, trivial pericardial effusion not c/w tamponade physiology;  c. 10/2015 Echo: EF 65%, Gr1 DD, triv AI, mild MR, mildly dil LA, mod TR, PASP 61mmHg.  Marland Kitchen Coronary artery disease    a. NSTEMI 06/2013; b.cath: severe three-vessel CAD w/ EF 30% & mild-mod MR; c. s/p 3 vessel CABG 07/02/13 (LIMA-LAD, SVG-OM, and SVG-RPDA);  d. 10/2015 MV: no ischemia/infarct.  . Diabetes mellitus without complication ([place])   . Diabetic neuropathy ([place])   . Dialysis patient [facility])    MWF  . ESRD (end stage renal disease) ([place])    a. 12/2015 initiated - mwf dialysis.  Marland Kitchen GERD (gastroesophageal reflux disease)   . Hyperlipidemia   . Hypertension   . Hypothyroidism   . Myocardial infarction ([place]) 2014  .  Neuropathy   . Pleural effusion 2015  . Pulmonary hypertension ([place])   . Renal insufficiency   . Wears dentures    full lower    Social History   Socioeconomic History  . Marital status: Married    Spouse name: Not on file  . Number of children: Not on file  . Years of education: Not on file  . Highest education level: Not on file  Occupational History  . Occupation: works for Building surveyor  Tobacco Use  . Smoking status: Never Smoker  . Smokeless tobacco: Never Used  Vaping Use  . Vaping Use: Never used  Substance and Sexual Activity  . Alcohol use: No    Alcohol/week: 0.0 standard drinks  . Drug use: No  . Sexual activity: Not on file  Other Topics Concern  . Not on file  Social History Narrative   Patient lives at home with her husband. Patient has 4 adult children.   Social Determinants of Health   Financial Resource  Strain:   . Difficulty of Paying Living Expenses:   Food Insecurity:   . Worried About Charity fundraiser in the Last Year:   . Arboriculturist in the Last Year:   Transportation Needs:   . Film/video editor (Medical):   Marland Kitchen Lack of Transportation (Non-Medical):   Physical Activity:   . Days of Exercise per Week:   . Minutes of Exercise per Session:   Stress:   . Feeling of Stress :   Social Connections:   . Frequency of Communication with Friends and Family:   . Frequency of Social Gatherings with Friends and Family:   . Attends Religious Services:   . Active Member of Clubs or Organizations:   . Attends Archivist Meetings:   Marland Kitchen Marital Status:   Intimate Partner Violence:   . Fear of Current or Ex-Partner:   . Emotionally Abused:   Marland Kitchen Physically Abused:   . Sexually Abused:     Past Surgical History:  Procedure Laterality Date  . A/V FISTULAGRAM Right 12/17/2016   Procedure: A/V Fistulagram;  Surgeon: Algernon Huxley, MD;  Location: [place] [facility];  Service: Cardiovascular;  Laterality: Right;  . A/V FISTULAGRAM Right 01/07/2017   Procedure: A/V Fistulagram;  Surgeon: Algernon Huxley, MD;  Location: [place] [facility];  Service: Cardiovascular;  Laterality: Right;  . A/V FISTULAGRAM Right 12/03/2017   Procedure: A/V FISTULAGRAM;  Surgeon: Katha Cabal, MD;  Location: [place] [facility];  Service: Cardiovascular;  Laterality: Right;  . A/V FISTULAGRAM Right 02/23/2019   Procedure: A/V Fistulagram;  Surgeon: Algernon Huxley, MD;  Location: [place] [facility];  Service: Cardiovascular;  Laterality: Right;  . A/V SHUNT INTERVENTION N/A 02/22/2017   Procedure: A/V SHUNT INTERVENTION;  Surgeon: Algernon Huxley, MD;  Location: [place] [facility];  Service: Cardiovascular;  Laterality: N/A;  . AV FISTULA PLACEMENT Right 02/03/2016   Procedure: INSERTION OF ARTERIOVENOUS (AV) GORE-TEX GRAFT ARM ( BRACH / AXILLARY );  Surgeon: Katha Cabal, MD;  Location: [facility]  ORS;  Service: Vascular;  Laterality: Right;  . AV FISTULA PLACEMENT Left 07/09/2019   Procedure: INSERTION OF ARTERIOVENOUS (AV) GORE-TEX GRAFT ARM ( BRACHIAL AXILLARY );  Surgeon: Algernon Huxley, MD;  Location: [facility] ORS;  Service: Vascular;  Laterality: Left;  . [place] REMOVAL Right 03/04/2019   Procedure: REMOVAL OF ARTERIOVENOUS GORETEX GRAFT ([place]);  Surgeon: Algernon Huxley, MD;  Location: [facility] ORS;  Service: Vascular;  Laterality: Right;  . CARDIAC CATHETERIZATION    . CATARACT EXTRACTION Bilateral   . CHOLECYSTECTOMY N/A 12/09/2014   Procedure: LAPAROSCOPIC CHOLECYSTECTOMY;  Surgeon: Marlyce [name], MD;  Location: [facility] ORS;  Service: General;  Laterality: N/A;  . CORONARY ARTERY BYPASS GRAFT N/A 07/02/2013   Procedure: CORONARY ARTERY BYPASS GRAFTING (CABG);  Surgeon: Ivin [name], MD;  Location: [place];  Service: Open Heart Surgery;  Laterality: N/A;  CABG x three, using left internal mammary artery and right leg greater saphenous vein harvested endoscopically  . ESOPHAGOGASTRODUODENOSCOPY N/A 04/11/2019   Procedure: ESOPHAGOGASTRODUODENOSCOPY (EGD);  Surgeon: Jonathon Bellows, MD;  Location: [facility] ENDOSCOPY;  Service: Gastroenterology;  Laterality: N/A;  . ESOPHAGOGASTRODUODENOSCOPY (EGD) WITH PROPOFOL N/A 11/24/2015   Procedure: ESOPHAGOGASTRODUODENOSCOPY (EGD) WITH PROPOFOL;  Surgeon: [name], MD;  Location: [place];  Service: Endoscopy;  Laterality: N/A;  Diabetic - insulin  . EYE SURGERY Bilateral    Cataract Extraction with IOL  . INTRAOPERATIVE TRANSESOPHAGEAL ECHOCARDIOGRAM N/A 07/02/2013   Procedure: INTRAOPERATIVE TRANSESOPHAGEAL ECHOCARDIOGRAM;  Surgeon: Ivin [name], MD;  Location: [place];  Service: Open Heart Surgery;  Laterality: N/A;  . PEG PLACEMENT N/A 03/03/2019   Procedure: PERCUTANEOUS ENDOSCOPIC GASTROSTOMY (PEG) PLACEMENT;  Surgeon: Virgel Manifold, MD;  Location: [facility] ENDOSCOPY;  Service: Endoscopy;  Laterality: N/A;  . PERIPHERAL VASCULAR  CATHETERIZATION Right 12/06/2015   Procedure: Dialysis/Perma Catheter Insertion;  Surgeon: Katha Cabal, MD;  Location: [place] [facility];  Service: Cardiovascular;  Laterality: Right;  . PERIPHERAL VASCULAR THROMBECTOMY Right 09/28/2016   Procedure: Peripheral Vascular Thrombectomy;  Surgeon: Katha Cabal, MD;  Location: [place] [facility];  Service: Cardiovascular;  Laterality: Right;  . PERIPHERAL VASCULAR THROMBECTOMY Right 05/20/2018   Procedure: PERIPHERAL VASCULAR THROMBECTOMY;  Surgeon: Katha Cabal, MD;  Location: [place] [facility];  Service: Cardiovascular;  Laterality: Right;  . PORTA CATH REMOVAL N/A 06/01/2016   Procedure: [place]i [name] Cath Removal;  Surgeon: Katha Cabal, MD;  Location: [place] [facility];  Service: Cardiovascular;  Laterality: N/A;  . THORACENTESIS Left 2015    Family History  Problem Relation Age of Onset  . COPD Mother   . Cancer Mother        Lung  . Pulmonary embolism Father   . Diabetes Father   . Diabetes Paternal Grandfather   . Heart disease Maternal Grandmother   . Colon cancer Neg Hx   . Colon polyps Neg Hx   . Esophageal cancer Neg Hx   . Pancreatic cancer Neg Hx   . Liver disease Neg Hx     Allergies  Allergen Reactions  . Nsaids Other (See Comments)    Contraindicated due to kidney disease.  Judeth Cornfield Reductase Inhibitors   . Doxycycline Other (See Comments)    tremor       Assessment & Plan:   1. ESRD (end stage renal disease) ([place]) The patient has some swelling in the left arm and around the proximal portion of the graft.  The patient is a continues to have pain in her hand as well.  We will have the patient return in 3 weeks for an HDA.  This is to allow some of the swelling to decrease as well as to determine if the patient has any evidence of steal syndrome.  Patient advised to continue elevating her arm to help with swelling. - traMADol (ULTRAM) 50 MG tablet; Take 1 tablet (50 mg total) by mouth every 12  (twelve) hours as needed.  Dispense: 30 tablet; Refill: 0 - VAS US DUPLEX DIALYSIS ACCESS (AVF, AVG); Future  2. Essential hypertension Continue antihypertensive medications as already ordered, these medications have been reviewed and there are no changes at this time.    Current Outpatient Medications on File Prior to Visit  Medication Sig Dispense Refill  . acetaminophen (TYLENOL) 325 MG tablet Take 650 mg by mouth daily as needed for moderate pain or headache.     Marland Kitchen atorvastatin (LIPITOR) 40 MG tablet Take 40 mg by mouth daily.    Marland Kitchen ELIQUIS 5 MG TABS tablet Take 5 mg by mouth 2 (two) times daily.     . fluticasone (FLONASE) 50 MCG/ACT nasal spray Place 2 sprays into both nostrils daily. (Patient taking differently: Place 2 sprays into both nostrils daily as needed for allergies or rhinitis. ) 16 g 6  . folic acid (FOLVITE) 1 MG tablet Take 1 tablet (1 mg total) by mouth daily.    Marland Kitchen gabapentin (NEURONTIN) 100 MG capsule Take 100 mg by mouth 3 (three) times daily.    . hydrALAZINE (APRESOLINE) 10 MG tablet Take 1 tablet (10 mg total) by mouth in the morning and at bedtime. 180 tablet 0  . hydrocortisone cream 1 % Apply topically as needed for itching. 30 g 0  . levothyroxine (SYNTHROID) 88 MCG tablet Take 88 mcg by mouth daily before breakfast.    . Melatonin 3 MG TABS Take 3 mg by mouth at bedtime as needed (sleep).     . multivitamin (RENA-VIT) TABS tablet Take 1 tablet by mouth at bedtime.  0  . nitroGLYCERIN (NITROSTAT) 0.4 MG SL tablet DISSOLVE ONE TABLET UNDER THE TONGUE EVERY 5 MINUTES AS NEEDED FOR CHEST PAIN.  DO NOT EXCEED A TOTAL OF 3 DOSES IN 15 MINUTES (Patient taking differently: Place 0.4 mg under the tongue every 5 (five) minutes as needed for chest pain. DISSOLVE ONE TABLET UNDER THE TONGUE EVERY 5 MINUTES AS NEEDED FOR CHEST PAIN.  DO NOT EXCEED A TOTAL OF 3 DOSES IN 15 MINUTES) 25 tablet 0  . ondansetron (ZOFRAN-ODT) 4 MG disintegrating tablet Take 1 tablet (4 mg total) by  mouth every 8 (eight) hours as needed for nausea or vomiting. Patient needs appointment with PCP to receive further refills. 30 tablet 0  . PACERONE 200 MG tablet Take 1 tablet (200 mg total) by mouth daily. Patient needs appointment with PCP to receive further refills. 30 tablet 0  . sodium zirconium cyclosilicate (LOKELMA) 10 g PACK packet Take 10 g by mouth daily.    . traZODone (DESYREL) 50 MG tablet Take 1 tablet (50 mg total) by mouth at bedtime. Patient needs appointment with PCP to receive further refills. 30 tablet 0  . vitamin C (VITAMIN C) 500 MG tablet Take 1 tablet (500 mg total) by mouth daily.    Marland Kitchen zinc sulfate 220 (50 Zn) MG capsule Take 1 capsule (220 mg total) by mouth daily.    . ferrous gluconate (FERGON) 324 MG tablet Take 1 tablet (324 mg total) by mouth daily.  3  . FLUoxetine (PROZAC) 20 MG capsule Take 1 capsule (20 mg total) by mouth daily. (Patient not taking: Reported on 08/18/2019) 90 capsule 1  . HYDROcodone-acetaminophen (NORCO) 5-325 MG tablet Take 1 tablet by mouth every 6 (six) hours as needed for moderate pain. (Patient not taking: Reported on 08/18/2019) 30 tablet 0  . ipratropium-albuterol (DUONEB) 0.5-2.5 (3) MG/3ML SOLN Take 3 mLs by nebulization every 4 (four) hours as needed. (Patient  not taking: Reported on 08/18/2019) 360 mL   . nystatin (MYCOSTATIN/NYSTOP) powder Apply 1 application topically 2 (two) times daily. (apply to abdomen and/or under breasts) (Patient not taking: Reported on 08/18/2019)    . pantoprazole sodium (PROTONIX) 40 mg/20 mL PACK Place 20 mLs (40 mg total) into feeding tube 2 (two) times daily. (Patient not taking: Reported on 08/18/2019) 30 mL   . senna-docusate (SENOKOT-S) 8.6-50 MG tablet Take 1 tablet by mouth at bedtime.     No current facility-administered medications on file prior to visit.    There are no Patient Instructions on file for this visit. No follow-ups on file.   Kris Hartmann, NP

## 2019-08-27 ENCOUNTER — Other Ambulatory Visit: Payer: Self-pay

## 2019-08-27 NOTE — Telephone Encounter (Signed)
Please find out if she is at home or if she is in a nursing facility. If she is at home she needs to follow-up in the office and I could refill until she is able to be seen. If she is in a nursing facility they should be able to refill her medications.

## 2019-08-28 MED ORDER — LEVOTHYROXINE SODIUM 88 MCG PO TABS: 88.0000 ug | ORAL_TABLET | Freq: Every day | ORAL | 1 refills | Status: AC

## 2019-08-28 MED ORDER — ATORVASTATIN CALCIUM 40 MG PO TABS: 40.0000 mg | ORAL_TABLET | Freq: Every day | ORAL | 1 refills | Status: AC

## 2019-08-28 NOTE — Telephone Encounter (Signed)
I called the patient's husband and she is home she is no longer in the facility. She is scheduled for 09/29/2018 to see the provider.  Mohmmad Saleeby,cma

## 2019-09-01 ENCOUNTER — Telehealth: Payer: Self-pay

## 2019-09-01 NOTE — Telephone Encounter (Signed)
Noted. I have not seen her in the past year and will have to see her to complete an assessment prior to prescribing the wheel chair.

## 2019-09-06 ENCOUNTER — Encounter: Payer: Self-pay | Admitting: Family Medicine

## 2019-09-08 ENCOUNTER — Other Ambulatory Visit: Payer: Self-pay

## 2019-09-08 ENCOUNTER — Telehealth (INDEPENDENT_AMBULATORY_CARE_PROVIDER_SITE_OTHER): Payer: Self-pay

## 2019-09-08 ENCOUNTER — Encounter: Payer: Self-pay | Admitting: Emergency Medicine

## 2019-09-08 ENCOUNTER — Other Ambulatory Visit (INDEPENDENT_AMBULATORY_CARE_PROVIDER_SITE_OTHER): Payer: Self-pay | Admitting: Nurse Practitioner

## 2019-09-08 DIAGNOSIS — E876 Hypokalemia: Secondary | ICD-10-CM | POA: Diagnosis not present

## 2019-09-08 DIAGNOSIS — N186 End stage renal disease: Secondary | ICD-10-CM | POA: Insufficient documentation

## 2019-09-08 DIAGNOSIS — Z7984 Long term (current) use of oral hypoglycemic drugs: Secondary | ICD-10-CM | POA: Diagnosis not present

## 2019-09-08 DIAGNOSIS — I120 Hypertensive chronic kidney disease with stage 5 chronic kidney disease or end stage renal disease: Secondary | ICD-10-CM | POA: Insufficient documentation

## 2019-09-08 DIAGNOSIS — E1140 Type 2 diabetes mellitus with diabetic neuropathy, unspecified: Secondary | ICD-10-CM | POA: Insufficient documentation

## 2019-09-08 DIAGNOSIS — I2510 Atherosclerotic heart disease of native coronary artery without angina pectoris: Secondary | ICD-10-CM | POA: Diagnosis not present

## 2019-09-08 DIAGNOSIS — T8241XA Breakdown (mechanical) of vascular dialysis catheter, initial encounter: Secondary | ICD-10-CM | POA: Diagnosis present

## 2019-09-08 DIAGNOSIS — Z20822 Contact with and (suspected) exposure to covid-19: Secondary | ICD-10-CM | POA: Diagnosis not present

## 2019-09-08 DIAGNOSIS — E1122 Type 2 diabetes mellitus with diabetic chronic kidney disease: Secondary | ICD-10-CM | POA: Diagnosis not present

## 2019-09-08 DIAGNOSIS — E039 Hypothyroidism, unspecified: Secondary | ICD-10-CM | POA: Insufficient documentation

## 2019-09-08 DIAGNOSIS — Z79899 Other long term (current) drug therapy: Secondary | ICD-10-CM | POA: Insufficient documentation

## 2019-09-08 LAB — BASIC METABOLIC PANEL
Anion gap: 14 (ref 5–15)
BUN: 73 mg/dL — ABNORMAL HIGH (ref 8–23)
CO2: 28 mmol/L (ref 22–32)
Calcium: 8.5 mg/dL — ABNORMAL LOW (ref 8.9–10.3)
Chloride: 94 mmol/L — ABNORMAL LOW (ref 98–111)
Creatinine, Ser: 10.71 mg/dL — ABNORMAL HIGH (ref 0.44–1.00)
GFR calc Af Amer: 4 mL/min — ABNORMAL LOW (ref >60)
GFR calc non Af Amer: 3 mL/min — ABNORMAL LOW (ref >60)
Glucose, Bld: 77 mg/dL (ref 70–99)
Potassium: 6.1 mmol/L — ABNORMAL HIGH (ref 3.5–5.1)
Sodium: 136 mmol/L (ref 135–145)

## 2019-09-08 LAB — CBC
HCT: 27.2 % — ABNORMAL LOW (ref 36.0–46.0)
Hemoglobin: 9.3 g/dL — ABNORMAL LOW (ref 12.0–15.0)
MCH: 28.7 pg (ref 26.0–34.0)
MCHC: 34.2 g/dL (ref 30.0–36.0)
MCV: 84 fL (ref 80.0–100.0)
Platelets: 196 10*3/uL (ref 150–400)
RBC: 3.24 MIL/uL — ABNORMAL LOW (ref 3.87–5.11)
RDW: 16.1 % — ABNORMAL HIGH (ref 11.5–15.5)
WBC: 6.3 10*3/uL (ref 4.0–10.5)
nRBC: 0 % (ref 0.0–0.2)

## 2019-09-08 LAB — TROPONIN I (HIGH SENSITIVITY): Troponin I (High Sensitivity): 10 ng/L (ref <18)

## 2019-09-08 MED ORDER — SODIUM CHLORIDE 0.9% FLUSH: 3.0000 mL | Freq: Once | INTRAVENOUS | Status: DC

## 2019-09-08 NOTE — ED Triage Notes (Signed)
Pt via ems from dialysis center, where she went today to have dialysis and was unable to do so because her port is not working correctly. Pt alert & oriented, in recliner. NAD noted.

## 2019-09-08 NOTE — Telephone Encounter (Signed)
A fax was received from [facility] for the patient regarding having a trouble with her cath. Patient will have a TPA infusion on 09/09/19 arriving to the [facility] at 10:30 am to run. Patient will do a rapid covid test on 09/09/19 before 10:30 am at the [place]. Pre-procedure instructions will be faxed back to [facility].

## 2019-09-08 NOTE — ED Triage Notes (Signed)
Pt comes into the ED via EMS from dialysis center, states pt site is not working and needs emergent dialysis, pt is a/ox4,-pt is bed bound due to previous stroke.

## 2019-09-09 ENCOUNTER — Ambulatory Visit: Payer: Medicare Other | Attending: Family Medicine

## 2019-09-09 ENCOUNTER — Observation Stay
Admission: EM | Admit: 2019-09-09 | Discharge: 2019-09-10 | Disposition: A | Discharge status: Home Health | Payer: Medicare Other | Attending: Internal Medicine | Admitting: Internal Medicine

## 2019-09-09 ENCOUNTER — Other Ambulatory Visit: Payer: Self-pay

## 2019-09-09 DIAGNOSIS — E875 Hyperkalemia: Secondary | ICD-10-CM

## 2019-09-09 DIAGNOSIS — I5032 Chronic diastolic (congestive) heart failure: Secondary | ICD-10-CM | POA: Diagnosis present

## 2019-09-09 DIAGNOSIS — T8241XA Breakdown (mechanical) of vascular dialysis catheter, initial encounter: Secondary | ICD-10-CM | POA: Diagnosis not present

## 2019-09-09 DIAGNOSIS — I482 Chronic atrial fibrillation, unspecified: Secondary | ICD-10-CM | POA: Diagnosis present

## 2019-09-09 DIAGNOSIS — I10 Essential (primary) hypertension: Secondary | ICD-10-CM | POA: Diagnosis present

## 2019-09-09 DIAGNOSIS — E1122 Type 2 diabetes mellitus with diabetic chronic kidney disease: Secondary | ICD-10-CM | POA: Diagnosis present

## 2019-09-09 DIAGNOSIS — T829XXA Unspecified complication of cardiac and vascular prosthetic device, implant and graft, initial encounter: Secondary | ICD-10-CM | POA: Diagnosis not present

## 2019-09-09 DIAGNOSIS — Z992 Dependence on renal dialysis: Secondary | ICD-10-CM

## 2019-09-09 DIAGNOSIS — N186 End stage renal disease: Secondary | ICD-10-CM

## 2019-09-09 DIAGNOSIS — I2510 Atherosclerotic heart disease of native coronary artery without angina pectoris: Secondary | ICD-10-CM | POA: Diagnosis present

## 2019-09-09 DIAGNOSIS — E039 Hypothyroidism, unspecified: Secondary | ICD-10-CM | POA: Diagnosis present

## 2019-09-09 LAB — HEMOGLOBIN A1C
Hgb A1c MFr Bld: 6.1 % — ABNORMAL HIGH (ref 4.8–5.6)
Mean Plasma Glucose: 128.37 mg/dL

## 2019-09-09 LAB — BASIC METABOLIC PANEL
Anion gap: 14 (ref 5–15)
Anion gap: 14 (ref 5–15)
BUN: 79 mg/dL — ABNORMAL HIGH (ref 8–23)
BUN: 78 mg/dL — ABNORMAL HIGH (ref 8–23)
CO2: 27 mmol/L (ref 22–32)
CO2: 27 mmol/L (ref 22–32)
Calcium: 8.6 mg/dL — ABNORMAL LOW (ref 8.9–10.3)
Calcium: 9.1 mg/dL (ref 8.9–10.3)
Chloride: 93 mmol/L — ABNORMAL LOW (ref 98–111)
Chloride: 93 mmol/L — ABNORMAL LOW (ref 98–111)
Creatinine, Ser: 11.03 mg/dL — ABNORMAL HIGH (ref 0.44–1.00)
Creatinine, Ser: 10.74 mg/dL — ABNORMAL HIGH (ref 0.44–1.00)
GFR calc Af Amer: 4 mL/min — ABNORMAL LOW (ref >60)
GFR calc Af Amer: 4 mL/min — ABNORMAL LOW (ref >60)
GFR calc non Af Amer: 3 mL/min — ABNORMAL LOW (ref >60)
GFR calc non Af Amer: 3 mL/min — ABNORMAL LOW (ref >60)
Glucose, Bld: 82 mg/dL (ref 70–99)
Glucose, Bld: 124 mg/dL — ABNORMAL HIGH (ref 70–99)
Potassium: 6.4 mmol/L (ref 3.5–5.1)
Potassium: 6.2 mmol/L — ABNORMAL HIGH (ref 3.5–5.1)
Sodium: 134 mmol/L — ABNORMAL LOW (ref 135–145)
Sodium: 134 mmol/L — ABNORMAL LOW (ref 135–145)

## 2019-09-09 LAB — HIV ANTIBODY (ROUTINE TESTING W REFLEX): HIV Screen 4th Generation wRfx: NONREACTIVE

## 2019-09-09 LAB — TROPONIN I (HIGH SENSITIVITY): Troponin I (High Sensitivity): 13 ng/L (ref <18)

## 2019-09-09 LAB — GLUCOSE, CAPILLARY
Glucose-Capillary: 91 mg/dL (ref 70–99)
Glucose-Capillary: 67 mg/dL — ABNORMAL LOW (ref 70–99)
Glucose-Capillary: 82 mg/dL (ref 70–99)
Glucose-Capillary: 134 mg/dL — ABNORMAL HIGH (ref 70–99)

## 2019-09-09 LAB — SARS CORONAVIRUS 2 BY RT PCR (HOSPITAL ORDER, PERFORMED IN ~~LOC~~ HOSPITAL LAB): SARS Coronavirus 2: NEGATIVE

## 2019-09-09 MED ORDER — SODIUM BICARBONATE 8.4 % IV SOLN
50.0000 meq | Freq: Once | INTRAVENOUS | Status: AC
  Administered 2019-09-09: 50 meq via INTRAVENOUS
  Filled 2019-09-09: qty 50

## 2019-09-09 MED ORDER — FLUOXETINE HCL 20 MG PO CAPS
20.0000 mg | ORAL_CAPSULE | Freq: Every day | ORAL | Status: DC
  Administered 2019-09-09 – 2019-09-10 (×2): 20 mg via ORAL
  Filled 2019-09-09 (×2): qty 1

## 2019-09-09 MED ORDER — FLUTICASONE PROPIONATE 50 MCG/ACT NA SUSP
2.0000 | Freq: Every day | NASAL | Status: DC | PRN
  Filled 2019-09-09: qty 16

## 2019-09-09 MED ORDER — NITROGLYCERIN 0.4 MG SL SUBL: 0.4000 mg | SUBLINGUAL_TABLET | SUBLINGUAL | Status: DC | PRN

## 2019-09-09 MED ORDER — PATIROMER SORBITEX CALCIUM 8.4 G PO PACK
8.4000 g | PACK | Freq: Every day | ORAL | Status: DC
  Administered 2019-09-09: 8.4 g via ORAL
  Filled 2019-09-09 (×2): qty 1

## 2019-09-09 MED ORDER — MELATONIN 3 MG PO TABS
3.0000 mg | ORAL_TABLET | Freq: Every evening | ORAL | Status: DC | PRN
  Filled 2019-09-09: qty 1

## 2019-09-09 MED ORDER — ONDANSETRON HCL 4 MG PO TABS: 4.0000 mg | ORAL_TABLET | Freq: Four times a day (QID) | ORAL | Status: DC | PRN

## 2019-09-09 MED ORDER — ONDANSETRON HCL 4 MG/2ML IJ SOLN: 4.0000 mg | Freq: Four times a day (QID) | INTRAMUSCULAR | Status: DC | PRN

## 2019-09-09 MED ORDER — LEVOTHYROXINE SODIUM 88 MCG PO TABS
88.0000 ug | ORAL_TABLET | Freq: Every day | ORAL | Status: DC
  Administered 2019-09-10: 88 ug via ORAL
  Filled 2019-09-09: qty 1

## 2019-09-09 MED ORDER — PANTOPRAZOLE SODIUM 40 MG PO TBEC
40.0000 mg | DELAYED_RELEASE_TABLET | Freq: Every day | ORAL | Status: DC
  Administered 2019-09-09 – 2019-09-10 (×2): 40 mg via ORAL
  Filled 2019-09-09 (×3): qty 1

## 2019-09-09 MED ORDER — ONDANSETRON HCL 4 MG/2ML IJ SOLN
4.0000 mg | Freq: Once | INTRAMUSCULAR | Status: AC
  Administered 2019-09-09: 4 mg via INTRAVENOUS
  Filled 2019-09-09: qty 2

## 2019-09-09 MED ORDER — GABAPENTIN 100 MG PO CAPS
300.0000 mg | ORAL_CAPSULE | Freq: Every day | ORAL | Status: DC
  Administered 2019-09-09: 300 mg via ORAL
  Filled 2019-09-09: qty 3

## 2019-09-09 MED ORDER — CALCIUM CHLORIDE 10 % IV SOLN
1.0000 g | Freq: Once | INTRAVENOUS | Status: AC
  Administered 2019-09-09: 1 g via INTRAVENOUS
  Filled 2019-09-09: qty 10

## 2019-09-09 MED ORDER — HYDRALAZINE HCL 10 MG PO TABS
10.0000 mg | ORAL_TABLET | Freq: Two times a day (BID) | ORAL | Status: DC
  Administered 2019-09-09 – 2019-09-10 (×2): 10 mg via ORAL
  Filled 2019-09-09 (×3): qty 1

## 2019-09-09 MED ORDER — TRAMADOL HCL 50 MG PO TABS: 50.0000 mg | ORAL_TABLET | Freq: Two times a day (BID) | ORAL | Status: DC | PRN

## 2019-09-09 MED ORDER — ATORVASTATIN CALCIUM 20 MG PO TABS
40.0000 mg | ORAL_TABLET | Freq: Every day | ORAL | Status: DC
  Administered 2019-09-09: 40 mg via ORAL
  Filled 2019-09-09 (×2): qty 2

## 2019-09-09 MED ORDER — INSULIN ASPART 100 UNIT/ML ~~LOC~~ SOLN
5.0000 [IU] | Freq: Once | SUBCUTANEOUS | Status: AC
  Administered 2019-09-09: 5 [IU] via INTRAVENOUS
  Filled 2019-09-09: qty 1

## 2019-09-09 MED ORDER — METHYLPREDNISOLONE SODIUM SUCC 125 MG IJ SOLR: 125.0000 mg | Freq: Once | INTRAMUSCULAR | Status: DC | PRN

## 2019-09-09 MED ORDER — SODIUM CHLORIDE 0.9 % IV SOLN
5.0000 mg | Freq: Once | INTRAVENOUS | Status: DC
  Filled 2019-09-09: qty 5

## 2019-09-09 MED ORDER — DIPHENHYDRAMINE HCL 50 MG/ML IJ SOLN: 50.0000 mg | Freq: Once | INTRAMUSCULAR | Status: DC | PRN

## 2019-09-09 MED ORDER — INSULIN ASPART 100 UNIT/ML ~~LOC~~ SOLN: 0.0000 [IU] | Freq: Every day | SUBCUTANEOUS | Status: DC

## 2019-09-09 MED ORDER — RENA-VITE PO TABS
1.0000 | ORAL_TABLET | Freq: Every day | ORAL | Status: DC
  Administered 2019-09-09: 1 via ORAL
  Filled 2019-09-09: qty 1

## 2019-09-09 MED ORDER — CHLORHEXIDINE GLUCONATE CLOTH 2 % EX PADS
6.0000 | MEDICATED_PAD | Freq: Every day | CUTANEOUS | Status: DC
  Administered 2019-09-10: 6 via TOPICAL
  Filled 2019-09-09 (×2): qty 6

## 2019-09-09 MED ORDER — FAMOTIDINE 20 MG PO TABS: 40.0000 mg | ORAL_TABLET | Freq: Once | ORAL | Status: DC | PRN

## 2019-09-09 MED ORDER — IPRATROPIUM-ALBUTEROL 0.5-2.5 (3) MG/3ML IN SOLN: 3.0000 mL | RESPIRATORY_TRACT | Status: DC | PRN

## 2019-09-09 MED ORDER — DEXTROSE 50 % IV SOLN
25.0000 g | Freq: Once | INTRAVENOUS | Status: AC
  Administered 2019-09-09: 25 g via INTRAVENOUS
  Filled 2019-09-09: qty 50

## 2019-09-09 MED ORDER — CEFAZOLIN SODIUM-DEXTROSE 1-4 GM/50ML-% IV SOLN
1.0000 g | Freq: Once | INTRAVENOUS | Status: DC | PRN
  Filled 2019-09-09: qty 50

## 2019-09-09 MED ORDER — INSULIN ASPART 100 UNIT/ML ~~LOC~~ SOLN: 0.0000 [IU] | Freq: Three times a day (TID) | SUBCUTANEOUS | Status: DC

## 2019-09-09 MED ORDER — ACETAMINOPHEN 650 MG RE SUPP: 650.0000 mg | Freq: Four times a day (QID) | RECTAL | Status: DC | PRN

## 2019-09-09 MED ORDER — TRAZODONE HCL 50 MG PO TABS
50.0000 mg | ORAL_TABLET | Freq: Every day | ORAL | Status: DC
  Administered 2019-09-09: 50 mg via ORAL
  Filled 2019-09-09: qty 1

## 2019-09-09 MED ORDER — MIDAZOLAM HCL 2 MG/ML PO SYRP: 8.0000 mg | ORAL_SOLUTION | Freq: Once | ORAL | Status: DC | PRN

## 2019-09-09 MED ORDER — AMIODARONE HCL 200 MG PO TABS
200.0000 mg | ORAL_TABLET | Freq: Every day | ORAL | Status: DC
  Administered 2019-09-09 – 2019-09-10 (×2): 200 mg via ORAL
  Filled 2019-09-09 (×2): qty 1

## 2019-09-09 MED ORDER — HYDROCODONE-ACETAMINOPHEN 5-325 MG PO TABS: 1.0000 | ORAL_TABLET | ORAL | Status: DC | PRN

## 2019-09-09 MED ORDER — SODIUM CHLORIDE 0.9 % IV SOLN: INTRAVENOUS | Status: DC

## 2019-09-09 MED ORDER — ACETAMINOPHEN 325 MG PO TABS: 650.0000 mg | ORAL_TABLET | Freq: Four times a day (QID) | ORAL | Status: DC | PRN

## 2019-09-09 MED ORDER — HYDROMORPHONE HCL 1 MG/ML IJ SOLN: 1.0000 mg | Freq: Once | INTRAMUSCULAR | Status: DC | PRN

## 2019-09-09 NOTE — ED Notes (Signed)
Pt complaining on nausea, MD notified, orders to follow

## 2019-09-09 NOTE — Progress Notes (Signed)
HD complete 

## 2019-09-09 NOTE — Progress Notes (Signed)
  PROGRESS NOTE    Jamie Arias  ZJQ:[id number] DOB: Jun 23, 1955 DOA: 09/09/2019  PCP: [name], MD    LOS - 0    Patient admitted earlier this morning with complication of her dialysis catheter.  She had presented for her usual Monday dialysis session yesterday where they were unable to access her dialysis catheter, so she was unable to get dialysis.  They recommended she come in for evaluation and declotting the catheter.  Interval subjective: Patient seen during dialysis this morning.  She denies acute complaints including fevers or chills, chest pain or shortness of breath, nausea vomiting or abdominal pain.  Exam: Chronically ill-appearing, obese, no acute distress, heart sounds RRR, no peripheral edema, lungs clear anteriorly  I have reviewed the full H&P by Dr. Damita Dunnings in detail, and I agree with the assessment and plan as outlined therein.     No Charge    Ezekiel Slocumb, DO [facility]   If 7PM-7AM, please contact night-coverage [email or web address] 09/09/2019, 6:39 PM

## 2019-09-09 NOTE — Progress Notes (Signed)
Arrived at patient's bedside in the ED, attempted to pull and flush permcath to the left chest. Blue cath pulled and flushed well, red cath pulled with resistance and flushed well. Per Dr. Basil Dess does not need to be heparin packed for now as we wait to decide what next steps will be. According to Dr. Candiss Norse patient should be able to have dialysis through her blue port or through her patent fistula to the left arm. Dr. Candiss Norse will consult with Dr. Delana Meyer and decide next steps. For now patient will remain in the ED.   Vaughan Sine, RN  09/09/2019

## 2019-09-09 NOTE — ED Notes (Signed)
This Rn attempted to provide report twice w/o success. This Rn will attempt a third time.

## 2019-09-09 NOTE — ED Notes (Signed)
Pt O2 sat 86% on RA. Placed on 2L [place], O2 sat 95%.

## 2019-09-09 NOTE — ED Notes (Signed)
Pt was cleaned and placed into a new brief this RN and Armed forces logistics/support/administrative officer. Pt soiled brief with 1 unmeasured void and 1 large formed brown BM.

## 2019-09-09 NOTE — Progress Notes (Signed)
[facility], [place] 09/09/19  Subjective:   LOS: 0 Patient known to our practice from outpatient dialysis.  She presented to the emergency room via EMS from the dialysis center because of catheter malfunction.   Labs in the ER reveal high potassium of 6.2 Patient is admitted for further evaluation and urgent hemodialysis Husband is at bedside He reports that she does not have any other acute complaints of shortness of breath or chest pain or fever.  She has otherwise been able to eat okay and is at her baseline health   Objective:  Vital signs in last 24 hours:  Temp:  [98.1 F (36.7 C)-99.4 F (37.4 C)] 98.1 F (36.7 C) (07/07 1010) Pulse Rate:  [54-78] 75 (07/07 1315) Resp:  [12-22] 18 (07/07 1010) BP: (113-158)/(33-100) 130/88 (07/07 1315) SpO2:  [86 %-100 %] 96 % (07/07 1010) Weight:  [79.4 kg] 79.4 kg (07/06 1756)  Weight change:  Filed Weights   09/08/19 1756  Weight: 79.4 kg    Intake/Output:   No intake or output data in the 24 hours ending 09/09/19 1323   Physical Exam: General:  No acute distress, laying in the bed  HEENT  dry oral mucous membrane  Pulm/lungs  normal breathing effort, [place] O2  CVS/Heart  no rub  Abdomen:   Soft, nontender  Extremities:  Trace edema  Neurologic:  Alert, able to answer questions  Skin:  No acute rashes  Access:  Left IJ PermCath, left upper arm AV graft       Basic Metabolic Panel:  Recent Labs  Lab 09/08/19 1841 09/09/19 0058 09/09/19 0628  NA 136 134* 134*  K 6.1* 6.4* 6.2*  CL 94* 93* 93*  CO2 28 27 27   GLUCOSE 77 82 124*  BUN 73* 79* 78*  CREATININE 10.71* 11.03* 10.74*  CALCIUM 8.5* 8.6* 9.1     CBC: Recent Labs  Lab 09/08/19 1841  WBC 6.3  HGB 9.3*  HCT 27.2*  MCV 84.0  PLT 196      Lab Results  Component Value Date   HEPBSAG NON REACTIVE 04/16/2019      Microbiology:  Recent Results (from the past 240 hour(s))  SARS Coronavirus 2 by RT PCR (hospital  order, performed in [place] hospital lab) Nasopharyngeal Nasopharyngeal Swab     Status: None   Collection Time: 09/09/19  2:24 AM   Specimen: Nasopharyngeal Swab  Result Value Ref Range Status   SARS Coronavirus 2 NEGATIVE NEGATIVE Final    Comment: (NOTE) SARS-CoV-2 target nucleic acids are NOT DETECTED.  The SARS-CoV-2 RNA is generally detectable in upper and lower respiratory specimens during the acute phase of infection. The lowest concentration of SARS-CoV-2 viral copies this assay can detect is 250 copies / mL. A negative result does not preclude SARS-CoV-2 infection and should not be used as the sole basis for treatment or other patient management decisions.  A negative result may occur with improper specimen collection / handling, submission of specimen other than nasopharyngeal swab, presence of viral mutation(s) within the areas targeted by this assay, and inadequate number of viral copies (<250 copies / mL). A negative result must be combined with clinical observations, patient history, and epidemiological information.  Fact Sheet for Patients:   StrictlyIdeas.no  Fact Sheet for Healthcare Providers: BankingDealers.co.za  This test is not yet approved or  cleared by the [place] FDA and has been authorized for detection and/or diagnosis of SARS-CoV-2 by FDA under an Emergency Use Authorization (  EUA).  This EUA will remain in effect (meaning this test can be used) for the duration of the COVID-19 declaration under Section 564(b)(1) of the Act, 21 U.S.C. section 360bbb-3(b)(1), unless the authorization is terminated or revoked sooner.  Performed at [facility], [address] [place], [place] [address]     Coagulation Studies: No results for input(s): LABPROT, INR in the last 72 hours.  Urinalysis: No results for input(s): COLORURINE, LABSPEC, PHURINE, GLUCOSEU, HGBUR, BILIRUBINUR, KETONESUR,  PROTEINUR, UROBILINOGEN, NITRITE, LEUKOCYTESUR in the last 72 hours.  Invalid input(s): APPERANCEUR    Imaging: No results found.   Medications:   . sodium chloride 10 mL/hr at 09/09/19 0923  . alteplase (TPA-ACTIVASE) *DIALYSIS CATH* 5 mg infusion    . alteplase (TPA-ACTIVASE) *DIALYSIS CATH* 5 mg infusion    .  ceFAZolin (ANCEF) IV     . Chlorhexidine Gluconate Cloth  6 each Topical Q0600  . insulin aspart  0-5 Units Subcutaneous QHS  . insulin aspart  0-9 Units Subcutaneous TID WC  . patiromer  8.4 g Oral Daily   acetaminophen **OR** acetaminophen, ceFAZolin (ANCEF) IV, diphenhydrAMINE, famotidine, HYDROcodone-acetaminophen, HYDROmorphone (DILAUDID) injection, methylPREDNISolone (SOLU-MEDROL) injection, midazolam, ondansetron **OR** ondansetron (ZOFRAN) IV, ondansetron (ZOFRAN) IV  Assessment/ Plan:  64 y.o. female with end stage renal disease on hemodialysis, hypertension, pulmonary hypertension, diabetes mellitus type II, diabetic neuropathy, congestive heart failure  was admitted on 09/09/2019 for  Principal Problem:   Complication of vascular dialysis catheter, initial encounter Active Problems:   Hypothyroid   HTN (hypertension)   Type 2 diabetes mellitus with ESRD (end-stage renal disease) (H[facility])   CAD (coronary artery disease)   Atrial fibrillation, chronic ([facility])   Chronic diastolic CHF (congestive heart failure) ([place])   ESRD on hemodialysis ([place])   Complication of vascular dialysis catheter  Complication of vascular dialysis catheter [T82.9XXA]  #.  Hyperkalemia #  ESRD with complication of dialysis access Catheter was accessed by vascular nursing staff.  Red port has malfunction as blood cannot be aspirated but both ports allow flushing of normal saline Patient also have a mature left upper arm AV graft. We will get dialysis staff to try to cannulate the left arm graft.  If successfully used today another outpatient, PermCath can be safely removed  #. Anemia of  CKD  Lab Results  Component Value Date   HGB 9.3 (L) 09/08/2019   Low dose EPO with HD  #. Secondary hyperparathyroidism of renal origin N 25.81      Component Value Date/Time   PTH 135 (H) 02/22/2017 1416   Lab Results  Component Value Date   PHOS 4.0 04/20/2019   Monitor calcium and phos level during this admission   #. Diabetes type 2 with CKD Hemoglobin A1C (no units)  Date Value  08/16/2017 7.1   Hgb A1c MFr Bld (%)  Date Value  09/09/2019 6.1 (H)     LOS: 0 Jamie Arias 7/7/20211:23 PM  [facility] [place], [place]

## 2019-09-09 NOTE — ED Notes (Signed)
OJ and dinner provided to pt at this time. Husband at bedside.

## 2019-09-09 NOTE — H&P (Addendum)
History and Physical    Jamie Arias YDX:[id number] DOB: 01/17/1956 DOA: 09/09/2019  PCP: [name], MD   Patient coming from: home  I have personally briefly reviewed patient's old medical records in [place]  Chief Complaint: missed dialysis due to dialysis catheter problem  HPI: Jamie Arias is a 64 y.o. female with medical history significant for hypertension, hyperlipidemia, diabetes mellitus, GERD, hypothyroidism, depression, CAD, CABG, ESRD-HD (MWF), dCHF, iron deficiency anemia, atrial fibrillation and left arm DVT on Eliquis, and for the most part total care dependent who presents from the dialysis center with a problem with her dialysis catheter, preventing her from getting scheduled dialysis on Monday, 09/07/2019.  She was advised to come in to have her dialysis catheter declotted.  Patient denied shortness of breath or chest pain and denied nausea vomiting or diarrhea.  History taken in car, husband at bedside who states that she is in her usual state of health ED Course: On arrival blood pressure 133/55 with otherwise normal vitals.  Blood work significant for potassium of 6.1, hemoglobin 9.3 with negative troponins of 10 and 13.  EKG showed sinus rhythm with no acute T wave peaking.  Did not have a chest x-ray.  Patient was treated in the ER with calcium gluconate insulin and dextrose and [place].  Hospitalist consulted for admission.  Review of Systems: As per HPI otherwise all other systems on review of systems negative.    Past Medical History:  Diagnosis Date  . Anemia   . Anginal pain ([place])   . Arthritis    upper back and neck   . Chronic diastolic CHF (congestive heart failure) ([place])    a. Due to ischemic cardiomyopathy. EF as low as 35%, improved to normal s/p CABG; b. echo 07/06/13: EF 55-60%, no RWMAs, mod TR, trivial pericardial effusion not c/w tamponade physiology;  c. 10/2015 Echo: EF 65%, Gr1 DD, triv AI, mild MR, mildly dil LA, mod TR, PASP  34mmHg.  Marland Kitchen Coronary artery disease    a. NSTEMI 06/2013; b.cath: severe three-vessel CAD w/ EF 30% & mild-mod MR; c. s/p 3 vessel CABG 07/02/13 (LIMA-LAD, SVG-OM, and SVG-RPDA);  d. 10/2015 MV: no ischemia/infarct.  . Diabetes mellitus without complication ([place])   . Diabetic neuropathy ([place])   . Dialysis patient [facility])    MWF  . ESRD (end stage renal disease) ([place])    a. 12/2015 initiated - mwf dialysis.  Marland Kitchen GERD (gastroesophageal reflux disease)   . Hyperlipidemia   . Hypertension   . Hypothyroidism   . Myocardial infarction ([place]) 2014  . Neuropathy   . Pleural effusion 2015  . Pulmonary hypertension ([place])   . Renal insufficiency   . Wears dentures    full lower    Past Surgical History:  Procedure Laterality Date  . A/V FISTULAGRAM Right 12/17/2016   Procedure: A/V Fistulagram;  Surgeon: Algernon Huxley, MD;  Location: [place] [facility];  Service: Cardiovascular;  Laterality: Right;  . A/V FISTULAGRAM Right 01/07/2017   Procedure: A/V Fistulagram;  Surgeon: Algernon Huxley, MD;  Location: [place] [facility];  Service: Cardiovascular;  Laterality: Right;  . A/V FISTULAGRAM Right 12/03/2017   Procedure: A/V FISTULAGRAM;  Surgeon: Katha Cabal, MD;  Location: [place] [facility];  Service: Cardiovascular;  Laterality: Right;  . A/V FISTULAGRAM Right 02/23/2019   Procedure: A/V Fistulagram;  Surgeon: Algernon Huxley, MD;  Location: [place] [facility];  Service: Cardiovascular;  Laterality: Right;  . A/V  SHUNT INTERVENTION N/A 02/22/2017   Procedure: A/V SHUNT INTERVENTION;  Surgeon: Algernon Huxley, MD;  Location: [place] [facility];  Service: Cardiovascular;  Laterality: N/A;  . AV FISTULA PLACEMENT Right 02/03/2016   Procedure: INSERTION OF ARTERIOVENOUS (AV) GORE-TEX GRAFT ARM ( BRACH / AXILLARY );  Surgeon: Katha Cabal, MD;  Location: [facility];  Service: Vascular;  Laterality: Right;  . AV FISTULA PLACEMENT Left 07/09/2019   Procedure: INSERTION OF ARTERIOVENOUS (AV) GORE-TEX  GRAFT ARM ( BRACHIAL AXILLARY );  Surgeon: Algernon Huxley, MD;  Location: [facility];  Service: Vascular;  Laterality: Left;  . [place] REMOVAL Right 03/04/2019   Procedure: REMOVAL OF ARTERIOVENOUS GORETEX GRAFT ([place]);  Surgeon: Algernon Huxley, MD;  Location: [facility];  Service: Vascular;  Laterality: Right;  . CARDIAC CATHETERIZATION    . CATARACT EXTRACTION Bilateral   . CHOLECYSTECTOMY N/A 12/09/2014   Procedure: LAPAROSCOPIC CHOLECYSTECTOMY;  Surgeon: Marlyce [name], MD;  Location: [facility];  Service: General;  Laterality: N/A;  . CORONARY ARTERY BYPASS GRAFT N/A 07/02/2013   Procedure: CORONARY ARTERY BYPASS GRAFTING (CABG);  Surgeon: Ivin [name], MD;  Location: [place];  Service: Open Heart Surgery;  Laterality: N/A;  CABG x three, using left internal mammary artery and right leg greater saphenous vein harvested endoscopically  . ESOPHAGOGASTRODUODENOSCOPY N/A 04/11/2019   Procedure: ESOPHAGOGASTRODUODENOSCOPY (EGD);  Surgeon: Jonathon Bellows, MD;  Location: [facility];  Service: Gastroenterology;  Laterality: N/A;  . ESOPHAGOGASTRODUODENOSCOPY (EGD) WITH PROPOFOL N/A 11/24/2015   Procedure: ESOPHAGOGASTRODUODENOSCOPY (EGD) WITH PROPOFOL;  Surgeon: [name], MD;  Location: [place];  Service: Endoscopy;  Laterality: N/A;  Diabetic - insulin  . EYE SURGERY Bilateral    Cataract Extraction with IOL  . INTRAOPERATIVE TRANSESOPHAGEAL ECHOCARDIOGRAM N/A 07/02/2013   Procedure: INTRAOPERATIVE TRANSESOPHAGEAL ECHOCARDIOGRAM;  Surgeon: Ivin [name], MD;  Location: [place];  Service: Open Heart Surgery;  Laterality: N/A;  . PEG PLACEMENT N/A 03/03/2019   Procedure: PERCUTANEOUS ENDOSCOPIC GASTROSTOMY (PEG) PLACEMENT;  Surgeon: Virgel Manifold, MD;  Location: [facility] ENDOSCOPY;  Service: Endoscopy;  Laterality: N/A;  . PERIPHERAL VASCULAR CATHETERIZATION Right 12/06/2015   Procedure: Dialysis/Perma Catheter Insertion;  Surgeon: Katha Cabal, MD;  Location: [place] [facility];   Service: Cardiovascular;  Laterality: Right;  . PERIPHERAL VASCULAR THROMBECTOMY Right 09/28/2016   Procedure: Peripheral Vascular Thrombectomy;  Surgeon: Katha Cabal, MD;  Location: [place] [facility];  Service: Cardiovascular;  Laterality: Right;  . PERIPHERAL VASCULAR THROMBECTOMY Right 05/20/2018   Procedure: PERIPHERAL VASCULAR THROMBECTOMY;  Surgeon: Katha Cabal, MD;  Location: [place] [facility];  Service: Cardiovascular;  Laterality: Right;  . PORTA CATH REMOVAL N/A 06/01/2016   Procedure: [place]i [name] Cath Removal;  Surgeon: Katha Cabal, MD;  Location: [place] [facility];  Service: Cardiovascular;  Laterality: N/A;  . THORACENTESIS Left 2015     reports that she has never smoked. She has never used smokeless tobacco. She reports that she does not drink alcohol and does not use drugs.  Allergies  Allergen Reactions  . Nsaids Other (See Comments)    Contraindicated due to kidney disease.  Judeth Cornfield Reductase Inhibitors   . Doxycycline Other (See Comments)    tremor    Family History  Problem Relation Age of Onset  . COPD Mother   . Cancer Mother        Lung  . Pulmonary embolism Father   . Diabetes Father   . Diabetes Paternal Grandfather   . Heart disease Maternal  Grandmother   . Colon cancer Neg Hx   . Colon polyps Neg Hx   . Esophageal cancer Neg Hx   . Pancreatic cancer Neg Hx   . Liver disease Neg Hx       Prior to Admission medications   Medication Sig Start Date End Date Taking? Authorizing Provider  acetaminophen (TYLENOL) 325 MG tablet Take 650 mg by mouth daily as needed for moderate pain or headache.     [provider]  atorvastatin (LIPITOR) 40 MG tablet Take 1 tablet (40 mg total) by mouth daily. 08/28/19   [name], MD  ELIQUIS 5 MG TABS tablet Take 5 mg by mouth 2 (two) times daily.  01/20/19   [provider]  ferrous gluconate (FERGON) 324 MG tablet Take 1 tablet (324 mg total) by mouth daily. 04/21/19    Sidney Ace, MD  FLUoxetine (PROZAC) 20 MG capsule Take 1 capsule (20 mg total) by mouth daily. Patient not taking: Reported on 08/18/2019 06/30/19   [name], MD  fluticasone [facility]) 50 MCG/ACT nasal spray Place 2 sprays into both nostrils daily. Patient taking differently: Place 2 sprays into both nostrils daily as needed for allergies or rhinitis.  09/17/17   [name], MD  folic acid (FOLVITE) 1 MG tablet Take 1 tablet (1 mg total) by mouth daily. 04/21/19   Sidney Ace, MD  gabapentin (NEURONTIN) 100 MG capsule Take 100 mg by mouth 3 (three) times daily.    [provider]  hydrALAZINE (APRESOLINE) 10 MG tablet Take 1 tablet (10 mg total) by mouth in the morning and at bedtime. 06/23/19   Marrianne Mood D, PA-C  HYDROcodone-acetaminophen (NORCO) 5-325 MG tablet Take 1 tablet by mouth every 6 (six) hours as needed for moderate pain. Patient not taking: Reported on 08/18/2019 07/09/19   Algernon Huxley, MD  hydrocortisone cream 1 % Apply topically as needed for itching. 04/20/19   Sreenath, Sudheer B, MD  ipratropium-albuterol (DUONEB) 0.5-2.5 (3) MG/3ML SOLN Take 3 mLs by nebulization every 4 (four) hours as needed. Patient not taking: Reported on 08/18/2019 04/20/19   Sidney Ace, MD  levothyroxine (SYNTHROID) 88 MCG tablet Take 1 tablet (88 mcg total) by mouth daily before breakfast. 08/28/19   [name], MD  Melatonin 3 MG TABS Take 3 mg by mouth at bedtime as needed (sleep).     [provider]  multivitamin (RENA-VIT) TABS tablet Take 1 tablet by mouth at bedtime. 09/09/18   Hosie Poisson, MD  nitroGLYCERIN (NITROSTAT) 0.4 MG SL tablet DISSOLVE ONE TABLET UNDER THE TONGUE EVERY 5 MINUTES AS NEEDED FOR CHEST PAIN.  DO NOT EXCEED A TOTAL OF 3 DOSES IN 15 MINUTES Patient taking differently: Place 0.4 mg under the tongue every 5 (five) minutes as needed for chest pain. DISSOLVE ONE TABLET UNDER THE TONGUE EVERY 5 MINUTES AS NEEDED FOR  CHEST PAIN.  DO NOT EXCEED A TOTAL OF 3 DOSES IN 15 MINUTES 08/18/18   Wellington Hampshire, MD  nystatin (MYCOSTATIN/NYSTOP) powder Apply 1 application topically 2 (two) times daily. (apply to abdomen and/or under breasts) Patient not taking: Reported on 08/18/2019    [provider]  ondansetron (ZOFRAN-ODT) 4 MG disintegrating tablet Take 1 tablet (4 mg total) by mouth every 8 (eight) hours as needed for nausea or vomiting. Patient needs appointment with PCP to receive further refills. 08/07/19   [name], MD  PACERONE 200 MG tablet Take 1 tablet (200 mg  total) by mouth daily. Patient needs appointment with PCP to receive further refills. 08/07/19   [name], MD  pantoprazole sodium (PROTONIX) 40 mg/20 mL PACK Place 20 mLs (40 mg total) into feeding tube 2 (two) times daily. Patient not taking: Reported on 08/18/2019 04/20/19   Sidney Ace, MD  senna-docusate (SENOKOT-S) 8.6-50 MG tablet Take 1 tablet by mouth at bedtime. 04/20/19   Ralene Muskrat B, MD  sodium zirconium cyclosilicate (LOKELMA) 10 g PACK packet Take 10 g by mouth daily. 04/20/19   Sreenath, Trula Slade, MD  traMADol (ULTRAM) 50 MG tablet Take 1 tablet (50 mg total) by mouth every 12 (twelve) hours as needed. 08/18/19   Kris Hartmann, NP  traZODone (DESYREL) 50 MG tablet Take 1 tablet (50 mg total) by mouth at bedtime. Patient needs appointment with PCP to receive further refills. 08/07/19   [name], MD  vitamin C (VITAMIN C) 500 MG tablet Take 1 tablet (500 mg total) by mouth daily. 09/10/18   Hosie Poisson, MD  zinc sulfate 220 (50 Zn) MG capsule Take 1 capsule (220 mg total) by mouth daily. 09/10/18   Hosie Poisson, MD    Physical Exam: Vitals:   09/09/19 0119 09/09/19 0130 09/09/19 0200 09/09/19 0300  BP: (!) 144/82 (!) 158/52 (!) 147/71 (!) 129/53  Pulse: 74 73 73 68  Resp: 16 15 (!) 21 15  Temp:      TempSrc:      SpO2: 100% 94% 95% 98%  Weight:      Height:         Vitals:    09/09/19 0119 09/09/19 0130 09/09/19 0200 09/09/19 0300  BP: (!) 144/82 (!) 158/52 (!) 147/71 (!) 129/53  Pulse: 74 73 73 68  Resp: 16 15 (!) 21 15  Temp:      TempSrc:      SpO2: 100% 94% 95% 98%  Weight:      Height:          Constitutional: Alert and oriented x 3 . Not in any apparent distress HEENT:      Head: Normocephalic and atraumatic.         Eyes: PERLA, EOMI, Conjunctivae are normal. Sclera is non-icteric.       Mouth/Throat: Mucous membranes are moist.       Neck: Supple with no signs of meningismus. Cardiovascular: Regular rate and rhythm. No murmurs, gallops, or rubs. 2+ symmetrical distal pulses are present . No JVD. No LE edema Respiratory: Respiratory effort normal .Lungs sounds clear bilaterally. No wheezes, crackles, or rhonchi.  Gastrointestinal: Soft, non tender, and non distended with positive bowel sounds. No rebound or guarding. Genitourinary: No CVA tenderness. Musculoskeletal: Nontender with normal range of motion in all extremities. No cyanosis, or erythema of extremities. Neurologic: Normal speech and language. Face is symmetric. Moving all extremities but disuse atrophy of lower extremities Skin: Skin is warm, dry.  No rash or ulcers Psychiatric: Mood and affect are normal.speech is tremulous  Labs on Admission: I have personally reviewed following labs and imaging studies  CBC: Recent Labs  Lab 09/08/19 1841  WBC 6.3  HGB 9.3*  HCT 27.2*  MCV 84.0  PLT 952   Basic Metabolic Panel: Recent Labs  Lab 09/08/19 1841 09/09/19 0058  NA 136 134*  K 6.1* 6.4*  CL 94* 93*  CO2 28 27  GLUCOSE 77 82  BUN 73* 79*  CREATININE 10.71* 11.03*  CALCIUM 8.5* 8.6*   GFR: Estimated Creatinine Clearance:  5.2 mL/min (A) (by C-G formula based on SCr of 11.03 mg/dL (H)). Liver Function Tests: No results for input(s): AST, ALT, ALKPHOS, BILITOT, PROT, ALBUMIN in the last 168 hours. No results for input(s): LIPASE, AMYLASE in the last 168 hours. No  results for input(s): AMMONIA in the last 168 hours. Coagulation Profile: No results for input(s): INR, PROTIME in the last 168 hours. Cardiac Enzymes: No results for input(s): CKTOTAL, CKMB, CKMBINDEX, TROPONINI in the last 168 hours. BNP (last 3 results) No results for input(s): PROBNP in the last 8760 hours. HbA1C: No results for input(s): HGBA1C in the last 72 hours. CBG: No results for input(s): GLUCAP in the last 168 hours. Lipid Profile: No results for input(s): CHOL, HDL, LDLCALC, TRIG, CHOLHDL, LDLDIRECT in the last 72 hours. Thyroid Function Tests: No results for input(s): TSH, T4TOTAL, FREET4, T3FREE, THYROIDAB in the last 72 hours. Anemia Panel: No results for input(s): VITAMINB12, FOLATE, FERRITIN, TIBC, IRON, RETICCTPCT in the last 72 hours. Urine analysis:    Component Value Date/Time   COLORURINE YELLOW (A) 08/23/2018 1209   APPEARANCEUR CLOUDY (A) 08/23/2018 1209   APPEARANCEUR Hazy 07/23/2013 0741   LABSPEC 1.016 08/23/2018 1209   LABSPEC 1.016 07/23/2013 0741   PHURINE 5.0 08/23/2018 1209   GLUCOSEU NEGATIVE 08/23/2018 1209   GLUCOSEU Negative 07/23/2013 0741   HGBUR SMALL (A) 08/23/2018 1209   BILIRUBINUR NEGATIVE 08/23/2018 1209   BILIRUBINUR Negative 07/23/2013 0741   KETONESUR NEGATIVE 08/23/2018 1209   PROTEINUR 100 (A) 08/23/2018 1209   UROBILINOGEN 0.2 06/30/2013 2213   NITRITE NEGATIVE 08/23/2018 1209   LEUKOCYTESUR SMALL (A) 08/23/2018 1209   LEUKOCYTESUR Negative 07/23/2013 0741    Radiological Exams on Admission: No results found.  EKG: Independently reviewed. Interpretation : Normal sinus rhythm.  No acute ST-T wave changes  Assessment/Plan 64 year old female with medical history significant for hypertension,  diabetes mellitus,  hypothyroidism, depression, CAD, CABG, ESRD-HD (MWF), dCHF, iron deficiency anemia, atrial fibrillation and left arm DVT on Eliquis, who presents from the dialysis center with a problem with her dialysis catheter,  preventing her from getting scheduled dialysis on Monday, 09/07/2019.  In no apparent distress on arrival    Complication of vascular dialysis catheter, initial encounter   ESRD on hemodialysis [facility]) -Patient for scheduled declotting of dialysis catheter -Nephrologist, Dr. Candiss Norse consulted    Hypothyroid -Continue home meds    HTN (hypertension) -Blood pressure borderline low at 113/55 so hold antihypertensives until BP picks up    Type 2 diabetes mellitus with ESRD (end-stage renal disease) (HCC) -Regular insulin sliding scale coverage    CAD (coronary artery disease) -No complaints of chest pain -Continue home statin, not currently on beta-blocker likely as she is on Pacerone.  Not on    Atrial fibrillation, chronic (HCC) -Continue Pacerone -Hold Eliquis overnight pending procedure in the a.m.    Chronic diastolic CHF (congestive heart failure) (HCC) -Not acutely decompensated in spite of missed dialysis -Continue to monitor -     DVT prophylaxis: Lovenox  Code Status: full code  Family Communication: Husband at bedside Disposition Plan: Back to previous home environment Consults called: Nephrologist, Dr. Nolon Lennert same Status:obs    Athena Masse MD [facility]     09/09/2019, 3:59 AM

## 2019-09-09 NOTE — ED Provider Notes (Signed)
[facility] Emergency Department Provider Note  ____________________________________________   First MD Initiated Contact with Patient 09/09/19 0033     (approximate)  I have reviewed the triage vital signs and the nursing notes.   HISTORY  Chief Complaint Vascular Access Problem    HPI Jamie Arias is a 64 y.o. female with below list of previous medical conditions including CVA, diabetes mellitus, chronic diastolic congestive heart failure status post CABG 2015, pulmonary hypertension, chronic kidney disease with dialysis Monday Wednesday Friday presents to the emergency department via EMS from the dialysis center secondary to inability to perform dialysis today due to nonfunctioning dialysis port.  Patient denies any difficulty breathing.  Patient denies any chest pain.  Patient denies any palpitations no nausea or vomiting.     Past Medical History:  Diagnosis Date  . Anemia   . Anginal pain ([place])   . Arthritis    upper back and neck   . Chronic diastolic CHF (congestive heart failure) ([place])    a. Due to ischemic cardiomyopathy. EF as low as 35%, improved to normal s/p CABG; b. echo 07/06/13: EF 55-60%, no RWMAs, mod TR, trivial pericardial effusion not c/w tamponade physiology;  c. 10/2015 Echo: EF 65%, Gr1 DD, triv AI, mild MR, mildly dil LA, mod TR, PASP 72mmHg.  Marland Kitchen Coronary artery disease    a. NSTEMI 06/2013; b.cath: severe three-vessel CAD w/ EF 30% & mild-mod MR; c. s/p 3 vessel CABG 07/02/13 (LIMA-LAD, SVG-OM, and SVG-RPDA);  d. 10/2015 MV: no ischemia/infarct.  . Diabetes mellitus without complication ([place])   . Diabetic neuropathy ([place])   . Dialysis patient [facility])    MWF  . ESRD (end stage renal disease) ([place])    a. 12/2015 initiated - mwf dialysis.  Marland Kitchen GERD (gastroesophageal reflux disease)   . Hyperlipidemia   . Hypertension   . Hypothyroidism   . Myocardial infarction ([place]) 2014  . Neuropathy   . Pleural effusion 2015  . Pulmonary  hypertension ([place])   . Renal insufficiency   . Wears dentures    full lower    Patient Active Problem List   Diagnosis Date Noted  . Complication of vascular dialysis catheter 09/09/2019  . History of 2019 novel coronavirus disease (COVID-19) 07/14/2019  . Malaise and fatigue 07/14/2019  . Chest pain 06/29/2019  . Hypertensive urgency 06/29/2019  . ESRD on hemodialysis ([place])   . Aftercare including intermittent dialysis ([place]) 05/26/2019  . Inappropriate diet and eating habits 04/30/2019  . Gastro-esophageal reflux disease with esophagitis, without bleeding 04/22/2019  . Pleural effusion, not elsewhere classified 04/22/2019  . Pulmonary hypertension, unspecified ([place]) 04/22/2019  . Palliative care by specialist   . Goals of care, counseling/discussion   . Abscess   . AF (paroxysmal atrial fibrillation) ([place])   . Nausea vomiting and diarrhea   . Oropharyngeal dysphagia   . Acute metabolic encephalopathy   . Acute blood loss anemia   . Arm DVT (deep venous thromboembolism), acute, left ([place]) 02/13/2019  . Acute encephalopathy 02/13/2019  . Decubitus ulcer of heel, bilateral, stage 2 ([place]) 02/10/2019  . Sepsis due to methicillin resistant Staphylococcus aureus (MRSA) ([place]) 02/10/2019  . MRSA bacteremia 02/10/2019  . Altered level of consciousness 02/09/2019  . 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 Anemia in chronic kidney disease 01/17/2019  . Coagulation defect, unspecified ([place]) 01/17/2019  . Fever, unspecified 01/17/2019  . Headache, unspecified 01/17/2019  . Iron deficiency anemia, unspecified 01/17/2019  . Moderate protein-calorie malnutrition ([place]) 01/17/2019  .  Other fluid overload 01/17/2019  . Pain, unspecified 01/17/2019  . Pruritus, unspecified 01/17/2019  . Secondary hyperparathyroidism of renal origin ([place]) 01/17/2019  . Shortness of breath 01/17/2019  . Altered mental status, unspecified 09/19/2018  . COVID-19 virus detected 09/19/2018  . Adjustment disorder  with depressed mood 08/31/2018  . COVID-19 virus infection 08/24/2018  . Acute on chronic respiratory failure with hypoxia ([place]) 08/24/2018  . UTI (urinary tract infection) 08/24/2018  . Allergic rhinitis 09/19/2017  . Uremia of renal origin 09/04/2017  . Chronic neck pain 07/25/2017  . Hyperkalemia 02/15/2017  . Complication of vascular dialysis catheter, initial encounter 12/01/2016  . ESRD (end stage renal disease) ([place]) 01/19/2016  . GI bleed 10/25/2015  . Hypertensive heart disease 10/24/2015  . Chronic diastolic CHF (congestive heart failure) ([place]) 10/24/2015  . Unstable angina ([place]) 10/23/2015  . Hypotension 09/02/2015  . Chronic systolic CHF (congestive heart failure) ([place]) 09/02/2015  . Depression 07/27/2015  . Calculus of gallbladder with chronic cholecystitis without obstruction   . Bilateral carotid bruits 11/30/2014  . Low magnesium levels 08/10/2013  . Anemia 08/10/2013  . Constipation 08/10/2013  . Atrial fibrillation, chronic ([place]) 07/30/2013  . Coronary artery disease   . CAD (coronary artery disease) 07/02/2013  . Acute systolic heart failure ([place]) 06/30/2013  . NSTEMI (non-ST elevated myocardial infarction) ([place]) 06/29/2013  . Vertigo 08/25/2012  . Sleep disorder 05/23/2012  . Hypothyroid 04/27/2012  . HTN (hypertension) 04/27/2012  . HLD (hyperlipidemia) 04/27/2012  . Type 2 diabetes mellitus with ESRD (end-stage renal disease) ([place]) 04/27/2012  . Neuropathy 04/27/2012    Past Surgical History:  Procedure Laterality Date  . A/V FISTULAGRAM Right 12/17/2016   Procedure: A/V Fistulagram;  Surgeon: Algernon Huxley, MD;  Location: [place] CV LAB;  Service: Cardiovascular;  Laterality: Right;  . A/V FISTULAGRAM Right 01/07/2017   Procedure: A/V Fistulagram;  Surgeon: Algernon Huxley, MD;  Location: [place] CV LAB;  Service: Cardiovascular;  Laterality: Right;  . A/V FISTULAGRAM Right 12/03/2017   Procedure: A/V FISTULAGRAM;  Surgeon: Katha Cabal,  MD;  Location: [place] CV LAB;  Service: Cardiovascular;  Laterality: Right;  . A/V FISTULAGRAM Right 02/23/2019   Procedure: A/V Fistulagram;  Surgeon: Algernon Huxley, MD;  Location: [place] CV LAB;  Service: Cardiovascular;  Laterality: Right;  . A/V SHUNT INTERVENTION N/A 02/22/2017   Procedure: A/V SHUNT INTERVENTION;  Surgeon: Algernon Huxley, MD;  Location: [place] CV LAB;  Service: Cardiovascular;  Laterality: N/A;  . AV FISTULA PLACEMENT Right 02/03/2016   Procedure: INSERTION OF ARTERIOVENOUS (AV) GORE-TEX GRAFT ARM ( BRACH / AXILLARY );  Surgeon: Katha Cabal, MD;  Location: [facility] ORS;  Service: Vascular;  Laterality: Right;  . AV FISTULA PLACEMENT Left 07/09/2019   Procedure: INSERTION OF ARTERIOVENOUS (AV) GORE-TEX GRAFT ARM ( BRACHIAL AXILLARY );  Surgeon: Algernon Huxley, MD;  Location: [facility] ORS;  Service: Vascular;  Laterality: Left;  . [place] REMOVAL Right 03/04/2019   Procedure: REMOVAL OF ARTERIOVENOUS GORETEX GRAFT ([place]);  Surgeon: Algernon Huxley, MD;  Location: [facility] ORS;  Service: Vascular;  Laterality: Right;  . CARDIAC CATHETERIZATION    . CATARACT EXTRACTION Bilateral   . CHOLECYSTECTOMY N/A 12/09/2014   Procedure: LAPAROSCOPIC CHOLECYSTECTOMY;  Surgeon: Marlyce [name], MD;  Location: [facility] ORS;  Service: General;  Laterality: N/A;  . CORONARY ARTERY BYPASS GRAFT N/A 07/02/2013   Procedure: CORONARY ARTERY BYPASS GRAFTING (CABG);  Surgeon: Ivin [name], MD;  Location: [place];  Service: Open Heart  Surgery;  Laterality: N/A;  CABG x three, using left internal mammary artery and right leg greater saphenous vein harvested endoscopically  . ESOPHAGOGASTRODUODENOSCOPY N/A 04/11/2019   Procedure: ESOPHAGOGASTRODUODENOSCOPY (EGD);  Surgeon: Jonathon Bellows, MD;  Location: [facility] ENDOSCOPY;  Service: Gastroenterology;  Laterality: N/A;  . ESOPHAGOGASTRODUODENOSCOPY (EGD) WITH PROPOFOL N/A 11/24/2015   Procedure: ESOPHAGOGASTRODUODENOSCOPY (EGD) WITH PROPOFOL;  Surgeon: [name], MD;  Location: [place];  Service: Endoscopy;  Laterality: N/A;  Diabetic - insulin  . EYE SURGERY Bilateral    Cataract Extraction with IOL  . INTRAOPERATIVE TRANSESOPHAGEAL ECHOCARDIOGRAM N/A 07/02/2013   Procedure: INTRAOPERATIVE TRANSESOPHAGEAL ECHOCARDIOGRAM;  Surgeon: Ivin [name], MD;  Location: [place];  Service: Open Heart Surgery;  Laterality: N/A;  . PEG PLACEMENT N/A 03/03/2019   Procedure: PERCUTANEOUS ENDOSCOPIC GASTROSTOMY (PEG) PLACEMENT;  Surgeon: Virgel Manifold, MD;  Location: [facility] ENDOSCOPY;  Service: Endoscopy;  Laterality: N/A;  . PERIPHERAL VASCULAR CATHETERIZATION Right 12/06/2015   Procedure: Dialysis/Perma Catheter Insertion;  Surgeon: Katha Cabal, MD;  Location: [place] CV LAB;  Service: Cardiovascular;  Laterality: Right;  . PERIPHERAL VASCULAR THROMBECTOMY Right 09/28/2016   Procedure: Peripheral Vascular Thrombectomy;  Surgeon: Katha Cabal, MD;  Location: [place] CV LAB;  Service: Cardiovascular;  Laterality: Right;  . PERIPHERAL VASCULAR THROMBECTOMY Right 05/20/2018   Procedure: PERIPHERAL VASCULAR THROMBECTOMY;  Surgeon: Katha Cabal, MD;  Location: [place] CV LAB;  Service: Cardiovascular;  Laterality: Right;  . PORTA CATH REMOVAL N/A 06/01/2016   Procedure: Glori [name] Cath Removal;  Surgeon: Katha Cabal, MD;  Location: [place] CV LAB;  Service: Cardiovascular;  Laterality: N/A;  . THORACENTESIS Left 2015    Prior to Admission medications   Medication Sig Start Date End Date Taking? Authorizing Provider  acetaminophen (TYLENOL) 325 MG tablet Take 650 mg by mouth daily as needed for moderate pain or headache.     [provider]  atorvastatin (LIPITOR) 40 MG tablet Take 1 tablet (40 mg total) by mouth daily. 08/28/19   [name], MD  ELIQUIS 5 MG TABS tablet Take 5 mg by mouth 2 (two) times daily.  01/20/19   [provider]  ferrous gluconate (FERGON) 324 MG tablet Take 1  tablet (324 mg total) by mouth daily. 04/21/19   Sidney Ace, MD  FLUoxetine (PROZAC) 20 MG capsule Take 1 capsule (20 mg total) by mouth daily. Patient not taking: Reported on 08/18/2019 06/30/19   [name], MD  fluticasone [facility]) 50 MCG/ACT nasal spray Place 2 sprays into both nostrils daily. Patient taking differently: Place 2 sprays into both nostrils daily as needed for allergies or rhinitis.  09/17/17   [name], MD  folic acid (FOLVITE) 1 MG tablet Take 1 tablet (1 mg total) by mouth daily. 04/21/19   Sidney Ace, MD  gabapentin (NEURONTIN) 100 MG capsule Take 100 mg by mouth 3 (three) times daily.    [provider]  hydrALAZINE (APRESOLINE) 10 MG tablet Take 1 tablet (10 mg total) by mouth in the morning and at bedtime. 06/23/19   Marrianne Mood D, PA-C  HYDROcodone-acetaminophen (NORCO) 5-325 MG tablet Take 1 tablet by mouth every 6 (six) hours as needed for moderate pain. Patient not taking: Reported on 08/18/2019 07/09/19   Algernon Huxley, MD  hydrocortisone cream 1 % Apply topically as needed for itching. 04/20/19   Sreenath, Sudheer B, MD  ipratropium-albuterol (DUONEB) 0.5-2.5 (3) MG/3ML SOLN Take 3 mLs by nebulization every 4 (four) hours  as needed. Patient not taking: Reported on 08/18/2019 04/20/19   Sidney Ace, MD  levothyroxine (SYNTHROID) 88 MCG tablet Take 1 tablet (88 mcg total) by mouth daily before breakfast. 08/28/19   [name], MD  Melatonin 3 MG TABS Take 3 mg by mouth at bedtime as needed (sleep).     [provider]  multivitamin (RENA-VIT) TABS tablet Take 1 tablet by mouth at bedtime. 09/09/18   Hosie Poisson, MD  nitroGLYCERIN (NITROSTAT) 0.4 MG SL tablet DISSOLVE ONE TABLET UNDER THE TONGUE EVERY 5 MINUTES AS NEEDED FOR CHEST PAIN.  DO NOT EXCEED A TOTAL OF 3 DOSES IN 15 MINUTES Patient taking differently: Place 0.4 mg under the tongue every 5 (five) minutes as needed for chest pain. DISSOLVE ONE TABLET  UNDER THE TONGUE EVERY 5 MINUTES AS NEEDED FOR CHEST PAIN.  DO NOT EXCEED A TOTAL OF 3 DOSES IN 15 MINUTES 08/18/18   Wellington Hampshire, MD  nystatin (MYCOSTATIN/NYSTOP) powder Apply 1 application topically 2 (two) times daily. (apply to abdomen and/or under breasts) Patient not taking: Reported on 08/18/2019    [provider]  ondansetron (ZOFRAN-ODT) 4 MG disintegrating tablet Take 1 tablet (4 mg total) by mouth every 8 (eight) hours as needed for nausea or vomiting. Patient needs appointment with PCP to receive further refills. 08/07/19   [name], MD  PACERONE 200 MG tablet Take 1 tablet (200 mg total) by mouth daily. Patient needs appointment with PCP to receive further refills. 08/07/19   [name], MD  pantoprazole sodium (PROTONIX) 40 mg/20 mL PACK Place 20 mLs (40 mg total) into feeding tube 2 (two) times daily. Patient not taking: Reported on 08/18/2019 04/20/19   Sidney Ace, MD  senna-docusate (SENOKOT-S) 8.6-50 MG tablet Take 1 tablet by mouth at bedtime. 04/20/19   Ralene Muskrat B, MD  sodium zirconium cyclosilicate (LOKELMA) 10 g PACK packet Take 10 g by mouth daily. 04/20/19   Sreenath, Trula Slade, MD  traMADol (ULTRAM) 50 MG tablet Take 1 tablet (50 mg total) by mouth every 12 (twelve) hours as needed. 08/18/19   Kris Hartmann, NP  traZODone (DESYREL) 50 MG tablet Take 1 tablet (50 mg total) by mouth at bedtime. Patient needs appointment with PCP to receive further refills. 08/07/19   [name], MD  vitamin C (VITAMIN C) 500 MG tablet Take 1 tablet (500 mg total) by mouth daily. 09/10/18   Hosie Poisson, MD  zinc sulfate 220 (50 Zn) MG capsule Take 1 capsule (220 mg total) by mouth daily. 09/10/18   Hosie Poisson, MD    Allergies Nsaids, 5-alpha reductase inhibitors, and Doxycycline  Family History  Problem Relation Age of Onset  . COPD Mother   . Cancer Mother        Lung  . Pulmonary embolism Father   . Diabetes Father   . Diabetes  Paternal Grandfather   . Heart disease Maternal Grandmother   . Colon cancer Neg Hx   . Colon polyps Neg Hx   . Esophageal cancer Neg Hx   . Pancreatic cancer Neg Hx   . Liver disease Neg Hx     Social History Social History   Tobacco Use  . Smoking status: Never Smoker  . Smokeless tobacco: Never Used  Vaping Use  . Vaping Use: Never used  Substance Use Topics  . Alcohol use: No    Alcohol/week: 0.0 standard drinks  . Drug use: No    Review of  Systems Constitutional: No fever/chills Eyes: No visual changes. ENT: No sore throat. Cardiovascular: Denies chest pain. Respiratory: Denies shortness of breath. Gastrointestinal: No abdominal pain.  No nausea, no vomiting.  No diarrhea.  No constipation. Genitourinary: Negative for dysuria. Musculoskeletal: Negative for neck pain.  Negative for back pain. Integumentary: Negative for rash. Neurological: Negative for headaches, focal weakness or numbness.   ____________________________________________   PHYSICAL EXAM:  VITAL SIGNS: ED Triage Vitals [09/08/19 1756]  Enc Vitals Group     BP (!) 113/55     Pulse Rate 64     Resp 17     Temp 98.1 F (36.7 C)     Temp Source Oral     SpO2 91 %     Weight 79.4 kg (175 lb)     Height 1.626 m (5\' 4" )     Head Circumference      Peak Flow      Pain Score      Pain Loc      Pain Edu?      Excl. in [place]?    Constitutional: Alert and oriented.  Eyes: Conjunctivae are normal.  Mouth/Throat: Patient is wearing a mask. Neck: No stridor.  No meningeal signs.   Cardiovascular: Normal rate, regular rhythm. Good peripheral circulation. Grossly normal heart sounds. Respiratory: Normal respiratory effort.  No retractions. Gastrointestinal: Soft and nontender. No distention.  Musculoskeletal: No lower extremity tenderness nor edema. No gross deformities of extremities. Neurologic:  Normal speech and language. No gross focal neurologic deficits are appreciated.  Skin:  Skin is warm,  dry and intact. Psychiatric: Mood and affect are normal. Speech and behavior are normal.  ____________________________________________   LABS (all labs ordered are listed, but only abnormal results are displayed)  Labs Reviewed  BASIC METABOLIC PANEL - Abnormal; Notable for the following components:      Result Value   Potassium 6.1 (*)    Chloride 94 (*)    BUN 73 (*)    Creatinine, Ser 10.71 (*)    Calcium 8.5 (*)    GFR calc non Af Amer 3 (*)    GFR calc Af Amer 4 (*)    All other components within normal limits  CBC - Abnormal; Notable for the following components:   RBC 3.24 (*)    Hemoglobin 9.3 (*)    HCT 27.2 (*)    RDW 16.1 (*)    All other components within normal limits  BASIC METABOLIC PANEL - Abnormal; Notable for the following components:   Sodium 134 (*)    Potassium 6.4 (*)    Chloride 93 (*)    BUN 79 (*)    Creatinine, Ser 11.03 (*)    Calcium 8.6 (*)    GFR calc non Af Amer 3 (*)    GFR calc Af Amer 4 (*)    All other components within normal limits  SARS CORONAVIRUS 2 BY RT PCR (HOSPITAL ORDER, [place] [facility])  HIV ANTIBODY (ROUTINE TESTING W REFLEX)  HEMOGLOBIN X1G  BASIC METABOLIC PANEL  TROPONIN I (HIGH SENSITIVITY)  TROPONIN I (HIGH SENSITIVITY)   ____________________________________________  EKG  ED ECG REPORT I, [place] N Abdel Effinger, the attending physician, personally viewed and interpreted this ECG.   Date: 09/09/2019  EKG Time: 12:44 AM  Rate: 72  Rhythm: Normal sinus rhythm with premature ventricular contractions  Axis: Normal  Intervals: Normal  ST&T Change: None ____________________________________   .Critical Care Performed by: Gregor Hams, MD Authorized by:  Gregor Hams, MD   Critical care provider statement:    Critical care time was exclusive of:  Separately billable procedures and treating other patients (Hyperkalemia)   Critical care was necessary to treat or prevent imminent or  life-threatening deterioration of the following conditions:  Metabolic crisis   Critical care was time spent personally by me on the following activities:  Development of treatment plan with patient or surrogate, discussions with consultants, evaluation of patient's response to treatment, examination of patient, obtaining history from patient or surrogate, ordering and performing treatments and interventions, ordering and review of laboratory studies, ordering and review of radiographic studies, pulse oximetry, re-evaluation of patient's condition and review of old charts     ____________________________________________   [place] / MDM / [place] / ED COURSE  As part of my medical decision making, I reviewed the following data within the electronic MEDICAL RECORD NUMBER   64 year old female presented with above-stated history and physical exam and need of dialysis.  Laboratory data notable from 6:41 PM for potassium of 6.1.  Will repeat BMP now.  Patient given calcium chloride 1 g IV.  Patient also given [place], 1 amp sodium bicarb, D50 and insulin IV.  Patient discussed with Dr. Candiss Norse nephrology for dialysis.  Repeat potassium 6.4 which was obtained beforementioned interventions.  Patient discussed with Dr. Damita Dunnings for hospital admission for further evaluation and management.  ____________________________________________  FINAL CLINICAL IMPRESSION(S) / ED DIAGNOSES  Final diagnoses:  Hyperkalemia     MEDICATIONS GIVEN DURING THIS VISIT:  Medications  patiromer Daryll Drown) packet 8.4 g (8.4 g Oral Given 09/09/19 0109)  acetaminophen (TYLENOL) tablet 650 mg (has no administration in time range)    Or  acetaminophen (TYLENOL) suppository 650 mg (has no administration in time range)  HYDROcodone-acetaminophen (NORCO/VICODIN) 5-325 MG per tablet 1-2 tablet (has no administration in time range)  ondansetron (ZOFRAN) tablet 4 mg (has no administration in time range)    Or    ondansetron (ZOFRAN) injection 4 mg (has no administration in time range)  insulin aspart (novoLOG) injection 0-9 Units (has no administration in time range)  insulin aspart (novoLOG) injection 0-5 Units (has no administration in time range)  calcium chloride injection 1 g (1 g Intravenous Given 09/09/19 0100)  ondansetron (ZOFRAN) injection 4 mg (4 mg Intravenous Given 09/09/19 0109)  sodium bicarbonate injection 50 mEq (50 mEq Intravenous Given 09/09/19 0109)  dextrose 50 % solution 25 g (25 g Intravenous Given 09/09/19 0159)  insulin aspart (novoLOG) injection 5 Units (5 Units Intravenous Given 09/09/19 0203)     ED Discharge Orders    None      *Please note:  Jamie Arias was evaluated in Emergency Department on 09/09/2019 for the symptoms described in the history of present illness. She was evaluated in the context of the global COVID-19 pandemic, which necessitated consideration that the patient might be at risk for infection with the SARS-CoV-2 virus that causes COVID-19. Institutional protocols and algorithms that pertain to the evaluation of patients at risk for COVID-19 are in a state of rapid change based on information released by regulatory bodies including the CDC and federal and state organizations. These policies and algorithms were followed during the patient's care in the ED.  Some ED evaluations and interventions may be delayed as a result of limited staffing during and after the pandemic.*  Note:  This document was prepared using Dragon voice recognition software and may include unintentional dictation errors.   Marjean Donna  N, MD 09/09/19 [phone]

## 2019-09-09 NOTE — Progress Notes (Signed)
Per md approval, changed gabapenin from 100mg  tid to 300mg  once daily due to renal function

## 2019-09-09 NOTE — ED Notes (Signed)
Pt transported to Dialysis ?

## 2019-09-10 DIAGNOSIS — E875 Hyperkalemia: Secondary | ICD-10-CM | POA: Diagnosis not present

## 2019-09-10 DIAGNOSIS — I482 Chronic atrial fibrillation, unspecified: Secondary | ICD-10-CM

## 2019-09-10 DIAGNOSIS — T8241XA Breakdown (mechanical) of vascular dialysis catheter, initial encounter: Secondary | ICD-10-CM | POA: Diagnosis not present

## 2019-09-10 DIAGNOSIS — I25709 Atherosclerosis of coronary artery bypass graft(s), unspecified, with unspecified angina pectoris: Secondary | ICD-10-CM

## 2019-09-10 DIAGNOSIS — T829XXA Unspecified complication of cardiac and vascular prosthetic device, implant and graft, initial encounter: Secondary | ICD-10-CM | POA: Diagnosis not present

## 2019-09-10 LAB — GLUCOSE, CAPILLARY
Glucose-Capillary: 134 mg/dL — ABNORMAL HIGH (ref 70–99)
Glucose-Capillary: 69 mg/dL — ABNORMAL LOW (ref 70–99)
Glucose-Capillary: 109 mg/dL — ABNORMAL HIGH (ref 70–99)

## 2019-09-10 LAB — CBC
HCT: 25.2 % — ABNORMAL LOW (ref 36.0–46.0)
Hemoglobin: 7.9 g/dL — ABNORMAL LOW (ref 12.0–15.0)
MCH: 27.9 pg (ref 26.0–34.0)
MCHC: 31.3 g/dL (ref 30.0–36.0)
MCV: 89 fL (ref 80.0–100.0)
Platelets: 199 10*3/uL (ref 150–400)
RBC: 2.83 MIL/uL — ABNORMAL LOW (ref 3.87–5.11)
RDW: 16.3 % — ABNORMAL HIGH (ref 11.5–15.5)
WBC: 5.7 10*3/uL (ref 4.0–10.5)
nRBC: 0 % (ref 0.0–0.2)

## 2019-09-10 LAB — BASIC METABOLIC PANEL
Anion gap: 12 (ref 5–15)
BUN: 32 mg/dL — ABNORMAL HIGH (ref 8–23)
CO2: 30 mmol/L (ref 22–32)
Calcium: 8.3 mg/dL — ABNORMAL LOW (ref 8.9–10.3)
Chloride: 94 mmol/L — ABNORMAL LOW (ref 98–111)
Creatinine, Ser: 5.95 mg/dL — ABNORMAL HIGH (ref 0.44–1.00)
GFR calc Af Amer: 8 mL/min — ABNORMAL LOW (ref >60)
GFR calc non Af Amer: 7 mL/min — ABNORMAL LOW (ref >60)
Glucose, Bld: 118 mg/dL — ABNORMAL HIGH (ref 70–99)
Potassium: 4.2 mmol/L (ref 3.5–5.1)
Sodium: 136 mmol/L (ref 135–145)

## 2019-09-10 MED ORDER — DEXTROSE 50 % IV SOLN
12.5000 g | INTRAVENOUS | Status: AC
  Administered 2019-09-10: 12.5 g via INTRAVENOUS
  Filled 2019-09-10: qty 50

## 2019-09-10 NOTE — TOC Transition Note (Addendum)
Transition of Care [facility]) - CM/SW Discharge Note   Patient Details  Name: Jamie Arias MRN: [id number] Date of Birth: 1956-01-09  [facility] CM/SW Contact:  Candie Chroman, LCSW Phone Number: 09/10/2019, 12:15 PM   Clinical Narrative:  CSW met with patient. Husband at bedside. CSW introduced role and explained that discharge planning would be discussed. Patient was active with [facility] for PT and OT. They can add a nurse and aide. Patient has a wheelchair at home that does not close up. She got it through [employer]. CSW called and spoke with customer service representative. Since patient now owns the wheelchair, they cannot come out to fix it. Husband would have to bring it to their [place] office and pay a $75 service fee as well as parts and labor. CSW spoke with [employer] representative, patient will not be able to get a new one paid through insurance until the wheelchair is 66 years old. Patient got the wheelchair last July. CSW encouraged patient's husband to look on [place], etc. Patient uses [facility] to get to HD. Patient's wheelchair is currently at the HD center but they will let her borrow one of their wheelchairs to get there tomorrow. Patient will need EMS transport home. CSW confirmed address on facesheet is correct. Transport set up for 1:00. No further concerns. Patient has orders to discharge home today. CSW signing off.  Final next level of care: Home w Home Health Services Barriers to Discharge: No Barriers Identified   Patient Goals and CMS Choice     Choice offered to / list presented to : NA  Discharge Placement                       Discharge Plan and Services     Post Acute Care Choice: Resumption of Svcs/PTA Provider                               Social Determinants of Health (SDOH) Interventions     Readmission Risk Interventions Readmission Risk Prevention Plan 02/13/2019 09/15/2018  Transportation Screening -  Complete  Medication Review Press photographer) Complete Complete  PCP or Specialist appointment within 3-5 days of discharge - Complete  HRI or [place] Complete Complete  SW Recovery Care/Counseling Consult Complete Complete  [place] Not Applicable Complete  Some recent data might be hidden

## 2019-09-10 NOTE — Progress Notes (Signed)
Hypoglycemic Event  CBG: 69   Treatment: D50 25 mL (12.5 gm)  Symptoms: None  Follow-up CBG: Time:826 CBG Result:134  Possible Reasons for Event: Inadequate meal intake  Comments/MD notified:yes     Francesco Sor

## 2019-09-10 NOTE — Discharge Instructions (Signed)
Hyperkalemia Hyperkalemia is when you have too much potassium in your blood. Potassium helps your body in many ways, but having too much can cause problems. If there is too much potassium in your blood, it can affect how your heart works. Potassium is normally removed from your body by your kidneys. Many things can cause the amount in your blood to be high. Medicines and other treatments can be used to bring the amount to a normal level. Treatment may need to be done in the hospital. Follow these instructions at home:   Take over-the-counter and prescription medicines only as told by your doctor.  Do not take any of the following unless your doctor says it is okay: ? Supplements. ? Natural products. ? Herbs. ? Vitamins.  Limit how much alcohol you drink as told by your doctor.  Do not use drugs. If you need help quitting, ask your doctor.  If you have kidney disease, you may need to follow a low-potassium diet. A food specialist (dietitian) can help you.  Keep all follow-up visits as told by your doctor. This is important. Contact a doctor if:  Your heartbeat is not regular or is very slow.  You feel dizzy (light-headed).  You feel weak.  You feel sick to your stomach (nauseous).  You have tingling in your hands or feet.  You lose feeling (have numbness) in your hands or feet. Get help right away if:  You are short of breath.  You have chest pain.  You pass out (faint).  You cannot move your muscles. Summary  Hyperkalemia is when you have too much potassium in your blood.  Take over-the-counter and prescription medicines only as told by your doctor.  Limit how much alcohol you drink as told by your doctor.  Contact a doctor if your heartbeat is not regular. This information is not intended to replace advice given to you by your health care provider. Make sure you discuss any questions you have with your health care provider. Document Revised: 02/04/2017 Document  Reviewed: 02/04/2017 Elsevier Patient Education  2020 [employer] Inc.  

## 2019-09-10 NOTE — Progress Notes (Signed)
[facility], [place] 09/10/19  Subjective:   LOS: 0 Patient known to our practice from outpatient dialysis.  She presented to the emergency room via EMS from the dialysis center because of catheter malfunction.   Labs in the ER reveal high potassium of 6.2 Patient is admitted for further evaluation and urgent hemodialysis Husband is at bedside Patient underwent dialysis yesterday via new AV graft.  Tolerated well. Repeat potassium of 4.2 this morning   Objective:  Vital signs in last 24 hours:  Temp:  [97.3 F (36.3 C)-98.8 F (37.1 C)] 98.5 F (36.9 C) (07/08 1200) Pulse Rate:  [59-79] 66 (07/08 1200) Resp:  [16-20] 16 (07/08 1200) BP: (112-151)/(33-88) 117/46 (07/08 1200) SpO2:  [94 %-100 %] 98 % (07/08 1200)  Weight change:  Filed Weights   09/08/19 1756  Weight: 79.4 kg    Intake/Output:    Intake/Output Summary (Last 24 hours) at 09/10/2019 1252 Last data filed at 09/09/2019 1400 Gross per 24 hour  Intake --  Output 1000 ml  Net -1000 ml     Physical Exam: General:  No acute distress, laying in the bed  HEENT  dry oral mucous membrane  Pulm/lungs  normal breathing effort, [place] O2  CVS/Heart  no rub  Abdomen:   Soft, nontender  Extremities:  Trace edema  Neurologic:  Alert, able to answer questions  Skin:  No acute rashes  Access:  Left IJ PermCath, left upper arm AV graft       Basic Metabolic Panel:  Recent Labs  Lab 09/08/19 1841 09/08/19 1841 09/09/19 0058 09/09/19 0628 09/10/19 0907  NA 136  --  134* 134* 136  K 6.1*  --  6.4* 6.2* 4.2  CL 94*  --  93* 93* 94*  CO2 28  --  27 27 30   GLUCOSE 77  --  82 124* 118*  BUN 73*  --  79* 78* 32*  CREATININE 10.71*  --  11.03* 10.74* 5.95*  CALCIUM 8.5*   < > 8.6* 9.1 8.3*   < > = values in this interval not displayed.     CBC: Recent Labs  Lab 09/08/19 1841 09/10/19 0907  WBC 6.3 5.7  HGB 9.3* 7.9*  HCT 27.2* 25.2*  MCV 84.0 89.0  PLT 196 199      Lab  Results  Component Value Date   HEPBSAG NON REACTIVE 04/16/2019      Microbiology:  Recent Results (from the past 240 hour(s))  SARS Coronavirus 2 by RT PCR (hospital order, performed in [facility] hospital lab) Nasopharyngeal Nasopharyngeal Swab     Status: None   Collection Time: 09/09/19  2:24 AM   Specimen: Nasopharyngeal Swab  Result Value Ref Range Status   SARS Coronavirus 2 NEGATIVE NEGATIVE Final    Comment: (NOTE) SARS-CoV-2 target nucleic acids are NOT DETECTED.  The SARS-CoV-2 RNA is generally detectable in upper and lower respiratory specimens during the acute phase of infection. The lowest concentration of SARS-CoV-2 viral copies this assay can detect is 250 copies / mL. A negative result does not preclude SARS-CoV-2 infection and should not be used as the sole basis for treatment or other patient management decisions.  A negative result may occur with improper specimen collection / handling, submission of specimen other than nasopharyngeal swab, presence of viral mutation(s) within the areas targeted by this assay, and inadequate number of viral copies (<250 copies / mL). A negative result must be combined with clinical observations, patient history, and  epidemiological information.  Fact Sheet for Patients:   StrictlyIdeas.no  Fact Sheet for Healthcare Providers: BankingDealers.co.za  This test is not yet approved or  cleared by the [place] FDA and has been authorized for detection and/or diagnosis of SARS-CoV-2 by FDA under an Emergency Use Authorization (EUA).  This EUA will remain in effect (meaning this test can be used) for the duration of the COVID-19 declaration under Section 564(b)(1) of the Act, 21 U.S.C. section 360bbb-3(b)(1), unless the authorization is terminated or revoked sooner.  Performed at [facility], [address] [place], [place] [address]     Coagulation  Studies: No results for input(s): LABPROT, INR in the last 72 hours.  Urinalysis: No results for input(s): COLORURINE, LABSPEC, PHURINE, GLUCOSEU, HGBUR, BILIRUBINUR, KETONESUR, PROTEINUR, UROBILINOGEN, NITRITE, LEUKOCYTESUR in the last 72 hours.  Invalid input(s): APPERANCEUR    Imaging: No results found.   Medications:   . sodium chloride 10 mL/hr at 09/09/19 0923  . alteplase (TPA-ACTIVASE) *DIALYSIS CATH* 5 mg infusion Stopped (09/09/19 1655)  . alteplase (TPA-ACTIVASE) *DIALYSIS CATH* 5 mg infusion Stopped (09/09/19 1656)  .  ceFAZolin (ANCEF) IV     . amiodarone  200 mg Oral Daily  . atorvastatin  40 mg Oral Daily  . Chlorhexidine Gluconate Cloth  6 each Topical Q0600  . FLUoxetine  20 mg Oral Daily  . gabapentin  300 mg Oral QHS  . hydrALAZINE  10 mg Oral BID  . insulin aspart  0-5 Units Subcutaneous QHS  . insulin aspart  0-9 Units Subcutaneous TID WC  . levothyroxine  88 mcg Oral Q0600  . multivitamin  1 tablet Oral QHS  . pantoprazole  40 mg Oral Daily  . patiromer  8.4 g Oral Daily  . traZODone  50 mg Oral QHS   acetaminophen **OR** acetaminophen, ceFAZolin (ANCEF) IV, diphenhydrAMINE, famotidine, fluticasone, HYDROcodone-acetaminophen, HYDROmorphone (DILAUDID) injection, ipratropium-albuterol, melatonin, methylPREDNISolone (SOLU-MEDROL) injection, midazolam, nitroGLYCERIN, ondansetron **OR** ondansetron (ZOFRAN) IV, ondansetron (ZOFRAN) IV, traMADol  Assessment/ Plan:  64 y.o. female with end stage renal disease on hemodialysis, hypertension, pulmonary hypertension, diabetes mellitus type II, diabetic neuropathy, congestive heart failure  was admitted on 09/09/2019 for  Principal Problem:   Complication of vascular dialysis catheter, initial encounter Active Problems:   Hypothyroid   HTN (hypertension)   Type 2 diabetes mellitus with ESRD (end-stage renal disease) (HCC)   CAD (coronary artery disease)   Atrial fibrillation, chronic (HCC)   Chronic diastolic  CHF (congestive heart failure) ([place])   ESRD on hemodialysis ([place])   Complication of vascular dialysis catheter  Hyperkalemia [E99.3] Complication of vascular dialysis catheter [T82.9XXA]  #.  Hyperkalemia #  ESRD with complication of dialysis access Catheter was accessed by vascular nursing staff.  Red port has malfunction as blood cannot be aspirated but both ports allow flushing of normal saline Patient also have a mature left upper arm AV graft which was successfully. If graft is able to be used successfully as outpatient, may remove permacath.  #. Anemia of CKD  Lab Results  Component Value Date   HGB 7.9 (L) 09/10/2019   Low dose EPO with HD, continue as outpatient  #. Secondary hyperparathyroidism of renal origin N 25.81      Component Value Date/Time   PTH 135 (H) 02/22/2017 1416   Lab Results  Component Value Date   PHOS 4.0 04/20/2019   Monitor calcium and phos level during this admission   #. Diabetes type 2 with CKD Hemoglobin A1C (no units)  Date Value  08/16/2017 7.1   Hgb A1c MFr Bld (%)  Date Value  09/09/2019 6.1 (H)     LOS: 0 Nirvan Laban 7/8/202112:52 PM  [facility] [place], [place]

## 2019-09-10 NOTE — Care Management Obs Status (Signed)
MEDICARE OBSERVATION STATUS NOTIFICATION   Patient Details  Name: MARK BENECKE MRN: [id number] Date of Birth: 1955/06/09   Medicare Observation Status Notification Given:  Yes    Candie Chroman, LCSW 09/10/2019, 11:02 AM

## 2019-09-10 NOTE — Progress Notes (Signed)
[name]  A and O x 4. VSS. Pt tolerating diet well. No complaints of pain or nausea. IV removed intact, prescriptions given. Pt voiced understanding of discharge instructions with no further questions. Pt discharged via EMS.     Allergies as of 09/10/2019      Reactions   Nsaids Other (See Comments)   Contraindicated due to kidney disease.   5-alpha Reductase Inhibitors    Doxycycline Other (See Comments)   tremor      Medication List    STOP taking these medications   ferrous gluconate 324 MG tablet Commonly known as: FERGON   HYDROcodone-acetaminophen 5-325 MG tablet Commonly known as: Norco   ipratropium-albuterol 0.5-2.5 (3) MG/3ML Soln Commonly known as: DUONEB   nystatin powder Commonly known as: MYCOSTATIN/NYSTOP   pantoprazole sodium 40 mg/20 mL Pack Commonly known as: PROTONIX   senna-docusate 8.6-50 MG tablet Commonly known as: Senokot-S   sodium zirconium cyclosilicate 10 g Pack packet Commonly known as: LOKELMA   zinc sulfate 220 (50 Zn) MG capsule     TAKE these medications   acetaminophen 325 MG tablet Commonly known as: TYLENOL Take 650 mg by mouth daily as needed for moderate pain or headache.   ascorbic acid 500 MG tablet Commonly known as: VITAMIN C Take 1 tablet (500 mg total) by mouth daily.   atorvastatin 40 MG tablet Commonly known as: LIPITOR Take 1 tablet (40 mg total) by mouth daily.   Eliquis 5 MG Tabs tablet Generic drug: apixaban Take 5 mg by mouth 2 (two) times daily.   FLUoxetine 20 MG capsule Commonly known as: PROZAC Take 1 capsule (20 mg total) by mouth daily.   fluticasone 50 MCG/ACT nasal spray Commonly known as: FLONASE Place 2 sprays into both nostrils daily. What changed:   when to take this  reasons to take this   folic acid 1 MG tablet Commonly known as: FOLVITE Take 1 tablet (1 mg total) by mouth daily.   gabapentin 100 MG capsule Commonly known as: NEURONTIN Take 100 mg by mouth 3 (three) times  daily.   hydrALAZINE 10 MG tablet Commonly known as: APRESOLINE Take 1 tablet (10 mg total) by mouth in the morning and at bedtime.   hydrocortisone cream 1 % Apply topically as needed for itching.   levothyroxine 88 MCG tablet Commonly known as: SYNTHROID Take 1 tablet (88 mcg total) by mouth daily before breakfast.   melatonin 3 MG Tabs tablet Take 3 mg by mouth at bedtime as needed (sleep).   multivitamin Tabs tablet Take 1 tablet by mouth at bedtime.   nitroGLYCERIN 0.4 MG SL tablet Commonly known as: NITROSTAT DISSOLVE ONE TABLET UNDER THE TONGUE EVERY 5 MINUTES AS NEEDED FOR CHEST PAIN.  DO NOT EXCEED A TOTAL OF 3 DOSES IN 15 MINUTES What changed: See the new instructions.   omeprazole 20 MG capsule Commonly known as: PRILOSEC Take 20 mg by mouth daily.   ondansetron 4 MG disintegrating tablet Commonly known as: ZOFRAN-ODT Take 1 tablet (4 mg total) by mouth every 8 (eight) hours as needed for nausea or vomiting. Patient needs appointment with PCP to receive further refills.   Pacerone 200 MG tablet Generic drug: amiodarone Take 1 tablet (200 mg total) by mouth daily. Patient needs appointment with PCP to receive further refills.   traMADol 50 MG tablet Commonly known as: ULTRAM Take 1 tablet (50 mg total) by mouth every 12 (twelve) hours as needed.   traZODone 50 MG tablet Commonly known  as: DESYREL Take 1 tablet (50 mg total) by mouth at bedtime. Patient needs appointment with PCP to receive further refills.       Vitals:   09/10/19 1150 09/10/19 1200  BP:  (!) 117/46  Pulse: 66 66  Resp:  16  Temp:  98.5 F (36.9 C)  SpO2: 96% 98%    Francesco Sor

## 2019-09-12 NOTE — Discharge Summary (Signed)
[facility] [place] NAME: Jamie Arias    MR#:  [id number]  DATE OF BIRTH:  1955-12-24  DATE OF ADMISSION:  09/09/2019   ADMITTING PHYSICIAN: Athena Masse, MD  DATE OF DISCHARGE: 09/10/2019  3:11 PM  PRIMARY CARE PHYSICIAN: [name], MD   ADMISSION DIAGNOSIS:  Hyperkalemia [R67.8] Complication of vascular dialysis catheter [T82.9XXA] DISCHARGE DIAGNOSIS:  Principal Problem:   Complication of vascular dialysis catheter, initial encounter Active Problems:   Hypothyroid   HTN (hypertension)   Type 2 diabetes mellitus with ESRD (end-stage renal disease) (HCC)   CAD (coronary artery disease)   Atrial fibrillation, chronic (HCC)   Chronic diastolic CHF (congestive heart failure) ([place])   ESRD on hemodialysis ([place])   Complication of vascular dialysis catheter  SECONDARY DIAGNOSIS:   Past Medical History:  Diagnosis Date  . Anemia   . Anginal pain ([place])   . Arthritis    upper back and neck   . Chronic diastolic CHF (congestive heart failure) ([place])    a. Due to ischemic cardiomyopathy. EF as low as 35%, improved to normal s/p CABG; b. echo 07/06/13: EF 55-60%, no RWMAs, mod TR, trivial pericardial effusion not c/w tamponade physiology;  c. 10/2015 Echo: EF 65%, Gr1 DD, triv AI, mild MR, mildly dil LA, mod TR, PASP 62mmHg.  Marland Kitchen Coronary artery disease    a. NSTEMI 06/2013; b.cath: severe three-vessel CAD w/ EF 30% & mild-mod MR; c. s/p 3 vessel CABG 07/02/13 (LIMA-LAD, SVG-OM, and SVG-RPDA);  d. 10/2015 MV: no ischemia/infarct.  . Diabetes mellitus without complication ([place])   . Diabetic neuropathy ([place])   . Dialysis patient [facility])    MWF  . ESRD (end stage renal disease) ([place])    a. 12/2015 initiated - mwf dialysis.  Marland Kitchen GERD (gastroesophageal reflux disease)   . Hyperlipidemia   . Hypertension   . Hypothyroidism   . Myocardial infarction ([place]) 2014  . Neuropathy   . Pleural effusion 2015  . Pulmonary hypertension ([place])   . Renal  insufficiency   . Wears dentures    full lower   HOSPITAL COURSE:  64 year old female with multiple medical problems including ESRD on hemodialysis, hypertension, pulmonary hypertension, diabetic neuropathy with longstanding history of diabetes, chronic diastolic CHF admitted for dialysis catheter malfunction and as a result missed dialysis and hyperkalemia  Dialysis catheter malfunction Her permacath was accessed by vascular surgery.  The report has malfunction his blood was not able to be aspirated but both ports allow flushing of normal saline. - Patient successfully underwent urgent dialysis via new AV graft in left upper arm which was matured and functioning. -Per nephrology if graft is able to be used successfully as an outpatient, they will remove permacath after 3 dialysis session.  Hyperkalemia -her potassium was 6.2 in the emergency room Due to missed dialysis due to catheter malfunction, corrected with dialysis while here in the hospital  Pressure Injury 02/10/19 Buttocks Left Stage II -  Partial thickness loss of dermis presenting as a shallow open ulcer with a red, pink wound bed without slough. (Active)  02/10/19 0538  Location: Buttocks  Location Orientation: Left  Staging: Stage II -  Partial thickness loss of dermis presenting as a shallow open ulcer with a red, pink wound bed without slough.  Wound Description (Comments):   Present on Admission: Yes     Pressure Injury 02/10/19 Heel Right Deep Tissue Injury - Purple or maroon localized  area of discolored intact skin or blood-filled blister due to damage of underlying soft tissue from pressure and/or shear. (Active)  02/10/19 2000  Location: Heel  Location Orientation: Right  Staging: Deep Tissue Injury - Purple or maroon localized area of discolored intact skin or blood-filled blister due to damage of underlying soft tissue from pressure and/or shear.  Wound Description (Comments):   Present on Admission: Yes       Pressure Injury 02/10/19 Heel Left Unstageable - Full thickness tissue loss in which the base of the ulcer is covered by slough (yellow, tan, gray, green or brown) and/or eschar (tan, brown or black) in the wound bed. Eschar (Active)  02/10/19 1700  Location: Heel  Location Orientation: Left  Staging: Unstageable - Full thickness tissue loss in which the base of the ulcer is covered by slough (yellow, tan, gray, green or brown) and/or eschar (tan, brown or black) in the wound bed.  Wound Description (Comments): Eschar  Present on Admission: Yes   DISCHARGE CONDITIONS:  Stable CONSULTS OBTAINED:   DRUG ALLERGIES:   Allergies  Allergen Reactions  . Nsaids Other (See Comments)    Contraindicated due to kidney disease.  Judeth Cornfield Reductase Inhibitors   . Doxycycline Other (See Comments)    tremor   DISCHARGE MEDICATIONS:   Allergies as of 09/10/2019      Reactions   Nsaids Other (See Comments)   Contraindicated due to kidney disease.   5-alpha Reductase Inhibitors    Doxycycline Other (See Comments)   tremor      Medication List    STOP taking these medications   ferrous gluconate 324 MG tablet Commonly known as: FERGON   HYDROcodone-acetaminophen 5-325 MG tablet Commonly known as: Norco   ipratropium-albuterol 0.5-2.5 (3) MG/3ML Soln Commonly known as: DUONEB   nystatin powder Commonly known as: MYCOSTATIN/NYSTOP   pantoprazole sodium 40 mg/20 mL Pack Commonly known as: PROTONIX   senna-docusate 8.6-50 MG tablet Commonly known as: Senokot-S   sodium zirconium cyclosilicate 10 g Pack packet Commonly known as: LOKELMA   zinc sulfate 220 (50 Zn) MG capsule     TAKE these medications   acetaminophen 325 MG tablet Commonly known as: TYLENOL Take 650 mg by mouth daily as needed for moderate pain or headache.   ascorbic acid 500 MG tablet Commonly known as: VITAMIN C Take 1 tablet (500 mg total) by mouth daily.   atorvastatin 40 MG tablet Commonly known as:  LIPITOR Take 1 tablet (40 mg total) by mouth daily.   Eliquis 5 MG Tabs tablet Generic drug: apixaban Take 5 mg by mouth 2 (two) times daily.   FLUoxetine 20 MG capsule Commonly known as: PROZAC Take 1 capsule (20 mg total) by mouth daily.   fluticasone 50 MCG/ACT nasal spray Commonly known as: FLONASE Place 2 sprays into both nostrils daily. What changed:   when to take this  reasons to take this   folic acid 1 MG tablet Commonly known as: FOLVITE Take 1 tablet (1 mg total) by mouth daily.   gabapentin 100 MG capsule Commonly known as: NEURONTIN Take 100 mg by mouth 3 (three) times daily.   hydrALAZINE 10 MG tablet Commonly known as: APRESOLINE Take 1 tablet (10 mg total) by mouth in the morning and at bedtime.   hydrocortisone cream 1 % Apply topically as needed for itching.   levothyroxine 88 MCG tablet Commonly known as: SYNTHROID Take 1 tablet (88 mcg total) by mouth daily before breakfast.   melatonin 3  MG Tabs tablet Take 3 mg by mouth at bedtime as needed (sleep).   multivitamin Tabs tablet Take 1 tablet by mouth at bedtime.   nitroGLYCERIN 0.4 MG SL tablet Commonly known as: NITROSTAT DISSOLVE ONE TABLET UNDER THE TONGUE EVERY 5 MINUTES AS NEEDED FOR CHEST PAIN.  DO NOT EXCEED A TOTAL OF 3 DOSES IN 15 MINUTES What changed: See the new instructions.   omeprazole 20 MG capsule Commonly known as: PRILOSEC Take 20 mg by mouth daily.   ondansetron 4 MG disintegrating tablet Commonly known as: ZOFRAN-ODT Take 1 tablet (4 mg total) by mouth every 8 (eight) hours as needed for nausea or vomiting. Patient needs appointment with PCP to receive further refills.   Pacerone 200 MG tablet Generic drug: amiodarone Take 1 tablet (200 mg total) by mouth daily. Patient needs appointment with PCP to receive further refills.   traMADol 50 MG tablet Commonly known as: ULTRAM Take 1 tablet (50 mg total) by mouth every 12 (twelve) hours as needed.   traZODone 50 MG  tablet Commonly known as: DESYREL Take 1 tablet (50 mg total) by mouth at bedtime. Patient needs appointment with PCP to receive further refills.      DISCHARGE INSTRUCTIONS:   DIET:  Renal diet DISCHARGE CONDITION:  Good ACTIVITY:  Activity as tolerated OXYGEN:  Home Oxygen: No.  Oxygen Delivery: room air DISCHARGE LOCATION:  home   If you experience worsening of your admission symptoms, develop shortness of breath, life threatening emergency, suicidal or homicidal thoughts you must seek medical attention immediately by calling 911 or calling your MD immediately  if symptoms less severe.  You Must read complete instructions/literature along with all the possible adverse reactions/side effects for all the Medicines you take and that have been prescribed to you. Take any new Medicines after you have completely understood and accpet all the possible adverse reactions/side effects.   Please note  You were cared for by a hospitalist during your hospital stay. If you have any questions about your discharge medications or the care you received while you were in the hospital after you are discharged, you can call the unit and asked to speak with the hospitalist on call if the hospitalist that took care of you is not available. Once you are discharged, your primary care physician will handle any further medical issues. Please note that NO REFILLS for any discharge medications will be authorized once you are discharged, as it is imperative that you return to your primary care physician (or establish a relationship with a primary care physician if you do not have one) for your aftercare needs so that they can reassess your need for medications and monitor your lab values.    On the day of Discharge:  VITAL SIGNS:  Blood pressure (!) 117/46, pulse 66, temperature 98.5 F (36.9 C), resp. rate 16, height 5\' 4"  (1.626 m), weight 79.4 kg, SpO2 98 %. PHYSICAL EXAMINATION:  GENERAL:  64  y.o.-year-old patient lying in the bed with no acute distress.  EYES: Pupils equal, round, reactive to light and accommodation. No scleral icterus. Extraocular muscles intact.  HEENT: Head atraumatic, normocephalic. Oropharynx and nasopharynx clear.  NECK:  Supple, no jugular venous distention. No thyroid enlargement, no tenderness.  LUNGS: Normal breath sounds bilaterally, no wheezing, rales,rhonchi or crepitation. No use of accessory muscles of respiration.  CARDIOVASCULAR: S1, S2 normal. No murmurs, rubs, or gallops.  ABDOMEN: Soft, non-tender, non-distended. Bowel sounds present. No organomegaly or mass.  EXTREMITIES: No pedal edema,  cyanosis, or clubbing.  NEUROLOGIC: Cranial nerves II through XII are intact. Muscle strength 5/5 in all extremities. Sensation intact. Gait not checked.  PSYCHIATRIC: The patient is alert and oriented x 3.  SKIN: No obvious rash, lesion, or ulcer.  DATA REVIEW:   CBC Recent Labs  Lab 09/10/19 0907  WBC 5.7  HGB 7.9*  HCT 25.2*  PLT 199    Chemistries  Recent Labs  Lab 09/10/19 0907  NA 136  K 4.2  CL 94*  CO2 30  GLUCOSE 118*  BUN 32*  CREATININE 5.95*  CALCIUM 8.3*     Outpatient follow-up  Follow-up Information    [name], MD. Go on 09/15/2019.   Specialty: Family Medicine Why: 8am appointment Contact information: [address] [place] [address] [phone]        Wellington Hampshire, MD. Go on 09/25/2019.   Specialty: Cardiology Why: 10am appointment Contact information: [place] [address] [phone]                Management plans discussed with the patient, family and they are in agreement.  CODE STATUS: Prior   TOTAL TIME TAKING CARE OF THIS PATIENT: 45 minutes.    Max Sane M.D on 09/12/2019 at 4:11 PM  [facility]   CC: Primary care physician; [name], MD   Note: This dictation was prepared with Dragon dictation along  with smaller phrase technology. Any transcriptional errors that result from this process are unintentional.

## 2019-09-14 ENCOUNTER — Telehealth: Payer: Self-pay | Admitting: Family Medicine

## 2019-09-14 ENCOUNTER — Other Ambulatory Visit (INDEPENDENT_AMBULATORY_CARE_PROVIDER_SITE_OTHER): Payer: Self-pay | Admitting: Nurse Practitioner

## 2019-09-14 ENCOUNTER — Other Ambulatory Visit: Payer: Self-pay

## 2019-09-14 ENCOUNTER — Other Ambulatory Visit: Payer: Self-pay | Admitting: Family Medicine

## 2019-09-14 DIAGNOSIS — N186 End stage renal disease: Secondary | ICD-10-CM

## 2019-09-14 DIAGNOSIS — T829XXA Unspecified complication of cardiac and vascular prosthetic device, implant and graft, initial encounter: Secondary | ICD-10-CM | POA: Insufficient documentation

## 2019-09-14 MED ORDER — ELIQUIS 5 MG PO TABS: 5.0000 mg | ORAL_TABLET | Freq: Two times a day (BID) | ORAL | 1 refills | Status: DC

## 2019-09-14 NOTE — Telephone Encounter (Signed)
Left detailed message for patients husband.Marland Kitchen

## 2019-09-14 NOTE — Telephone Encounter (Signed)
Patients husband stated that he wanted to let PCP know that when she does not receive her Dialysis, she is stating she is somewhere else like the hospital but in reality she is home. After she has her dialysis she is pretty much back to normal. Patients has appointment with PCP next Tuesday per husband. Patient is currently in dialysis.

## 2019-09-14 NOTE — Telephone Encounter (Signed)
Is this ok to refill?  

## 2019-09-14 NOTE — Telephone Encounter (Signed)
Noted. This is could be related to her becoming uremic if she is missing dialysis. She needs to make sure she goes to every dialysis appointment. She should also mention this to her nephrologist at dialysis.

## 2019-09-14 NOTE — Telephone Encounter (Signed)
We will see patient tomorrow and determine if refill is appropriate at that time

## 2019-09-14 NOTE — Telephone Encounter (Signed)
Pt husband called say that she is having altered mental status

## 2019-09-15 ENCOUNTER — Encounter (INDEPENDENT_AMBULATORY_CARE_PROVIDER_SITE_OTHER): Payer: Self-pay

## 2019-09-15 ENCOUNTER — Encounter (INDEPENDENT_AMBULATORY_CARE_PROVIDER_SITE_OTHER): Payer: Medicare Other

## 2019-09-15 ENCOUNTER — Ambulatory Visit (INDEPENDENT_AMBULATORY_CARE_PROVIDER_SITE_OTHER): Payer: Medicare Other | Admitting: Vascular Surgery

## 2019-09-15 ENCOUNTER — Ambulatory Visit: Payer: Medicare Other | Admitting: Family Medicine

## 2019-09-21 ENCOUNTER — Other Ambulatory Visit: Payer: Self-pay | Admitting: Family Medicine

## 2019-09-22 ENCOUNTER — Ambulatory Visit (INDEPENDENT_AMBULATORY_CARE_PROVIDER_SITE_OTHER): Payer: Medicare Other | Admitting: Family Medicine

## 2019-09-22 ENCOUNTER — Encounter: Payer: Self-pay | Admitting: Family Medicine

## 2019-09-22 ENCOUNTER — Other Ambulatory Visit: Payer: Self-pay

## 2019-09-22 DIAGNOSIS — J309 Allergic rhinitis, unspecified: Secondary | ICD-10-CM

## 2019-09-22 DIAGNOSIS — N186 End stage renal disease: Secondary | ICD-10-CM | POA: Diagnosis not present

## 2019-09-22 DIAGNOSIS — R42 Dizziness and giddiness: Secondary | ICD-10-CM | POA: Diagnosis not present

## 2019-09-22 DIAGNOSIS — Z7409 Other reduced mobility: Secondary | ICD-10-CM | POA: Diagnosis not present

## 2019-09-22 NOTE — Assessment & Plan Note (Signed)
Stable.  I encouraged them to discuss the permacath with dialysis tomorrow when she goes.

## 2019-09-22 NOTE — Progress Notes (Signed)
[name], MD Phone: [phone]  Jamie Arias is a 64 y.o. female who presents today for follow-up.  ESRD on dialysis: Patient had access malfunction and was hospitalized.  Her AV graft was used in her left upper arm for dialysis while in the hospital.  They have been using this since discharge from hospital.  She does still have a permacath in place.  She does dialysis Monday Wednesday and Friday.  She missed last Wednesday due to nausea.  Vertigo: This has been going on for some time now.  She notes when at home laying down if she has to roll over for her husband to change her she will start to spin.  This will last a couple of minutes and goes away on its own with laying still.  No tinnitus.  Notes her right ear does feel full.  It occurs if she rolls either way.  She has had vertigo in the past.  Left eyelid puffiness: This has been going on for the last several days.  She notes the APP at nephrology looked at it and sent in an antibiotic earlier this week.  The patient notes the swelling went away yesterday though came back today.  There is no associated pain.  No vision issues.  She does note some chronic allergic rhinitis issues with some eye watering and scratchy throat that have been going on for some time.  She notes no eye itching.  She does not consistently use her Flonase.  Mobility difficulty: Patient uses a wheelchair that she got during her prior hospitalization and notes it does not fit her all that well.  She continues to have difficulty walking due to generalized weakness and she is working with physical therapy on that.  She needs help with all of her ADLs other than feeding herself.  No recent falls.  Social History   Tobacco Use  Smoking Status Never Smoker  Smokeless Tobacco Never Used     ROS see history of present illness  Objective  Physical Exam Vitals:   09/22/19 1122  BP: 110/60  Pulse: (!) 57  Temp: 98.1 F (36.7 C)  SpO2: 95%    BP  Readings from Last 3 Encounters:  09/22/19 110/60  09/10/19 (!) 117/46  08/18/19 (!) 145/67   Wt Readings from Last 3 Encounters:  09/22/19 171 lb (77.6 kg)  09/08/19 175 lb (79.4 kg)  08/18/19 175 lb (79.4 kg)    Physical Exam Constitutional:      General: She is not in acute distress.    Appearance: She is not diaphoretic.  HENT:     Ears:     Comments: Cerumen impaction bilaterally, this was irrigated by CMA and patient tolerated this well.  Normal TMs after irrigation. Eyes:     Conjunctiva/sclera: Conjunctivae normal.     Pupils: Pupils are equal, round, and reactive to light.     Comments: There is puffiness to her left lateral eyelid with no erythema, tenderness, induration, or warmth  Cardiovascular:     Rate and Rhythm: Normal rate and regular rhythm.     Heart sounds: Normal heart sounds.  Pulmonary:     Effort: Pulmonary effort is normal.     Breath sounds: Normal breath sounds.  Musculoskeletal:     Right lower leg: No edema.     Left lower leg: No edema.  Skin:    General: Skin is warm and dry.  Neurological:     Mental Status: She is alert.  Assessment/Plan: Please see individual problem list.  ESRD (end stage renal disease) ([place]) Stable.  I encouraged them to discuss the permacath with dialysis tomorrow when she goes.  Allergic rhinitis I suspect the eyelid puffiness may be related to allergy issues.  There is no sign of infection.  I have asked them to find out what antibiotic was prescribed by nephrology and to let me know.  I discussed that I would not start her on an antibiotic at this time for this issue.  She will start on Flonase to see if that helps with her allergy symptoms and the eyelid issue.  Vertigo I suspect this is BPPV given her reported symptoms.  Given her generalized weakness that is chronic we are unable to get her onto the exam table to complete an exam for this.  She will be given Epley maneuvers to do at home and she will see  if her physical therapist can go over these with her.  If she is unable to do that they will let us know and we could refer her for vestibular rehab.  Mobility impaired We will work on ordering a wheelchair for her.    No orders of the defined types were placed in this encounter.   No orders of the defined types were placed in this encounter.   This visit occurred during the SARS-CoV-2 public health emergency.  Safety protocols were in place, including screening questions prior to the visit, additional usage of staff PPE, and extensive cleaning of exam room while observing appropriate contact time as indicated for disinfecting solutions.    [name], MD [place]

## 2019-09-22 NOTE — Assessment & Plan Note (Signed)
I suspect this is BPPV given her reported symptoms.  Given her generalized weakness that is chronic we are unable to get her onto the exam table to complete an exam for this.  She will be given Epley maneuvers to do at home and she will see if her physical therapist can go over these with her.  If she is unable to do that they will let us know and we could refer her for vestibular rehab.

## 2019-09-22 NOTE — Assessment & Plan Note (Signed)
I suspect the eyelid puffiness may be related to allergy issues.  There is no sign of infection.  I have asked them to find out what antibiotic was prescribed by nephrology and to let me know.  I discussed that I would not start her on an antibiotic at this time for this issue.  She will start on Flonase to see if that helps with her allergy symptoms and the eyelid issue.

## 2019-09-22 NOTE — Patient Instructions (Signed)
Please let me know what the nephrology PA sent in for your eye. If you develop worsening swelling you develop eye pain or vision changes please seek medical attention.  How to Perform the Epley Maneuver The Epley maneuver is an exercise that relieves symptoms of vertigo. Vertigo is the feeling that you or your surroundings are moving when they are not. When you feel vertigo, you may feel like the room is spinning and have trouble walking. Dizziness is a little different than vertigo. When you are dizzy, you may feel unsteady or light-headed. You can do this maneuver at home whenever you have symptoms of vertigo. You can do it up to 3 times a day until your symptoms go away. Even though the Epley maneuver may relieve your vertigo for a few weeks, it is possible that your symptoms will return. This maneuver relieves vertigo, but it does not relieve dizziness. What are the risks? If it is done correctly, the Epley maneuver is considered safe. Sometimes it can lead to dizziness or nausea that goes away after a short time. If you develop other symptoms, such as changes in vision, weakness, or numbness, stop doing the maneuver and call your health care provider. How to perform the Epley maneuver 1. Sit on the edge of a bed or table with your back straight and your legs extended or hanging over the edge of the bed or table. 2. Turn your head halfway toward the affected ear or side. 3. Lie backward quickly with your head turned until you are lying flat on your back. You may want to position a pillow under your shoulders. 4. Hold this position for 30 seconds. You may experience an attack of vertigo. This is normal. 5. Turn your head to the opposite direction until your unaffected ear is facing the floor. 6. Hold this position for 30 seconds. You may experience an attack of vertigo. This is normal. Hold this position until the vertigo stops. 7. Turn your whole body to the same side as your head. Hold for  another 30 seconds. 8. Sit back up. You can repeat this exercise up to 3 times a day. Follow these instructions at home:  After doing the Epley maneuver, you can return to your normal activities.  Ask your health care provider if there is anything you should do at home to prevent vertigo. He or she may recommend that you: ? Keep your head raised (elevated) with two or more pillows while you sleep. ? Do not sleep on the side of your affected ear. ? Get up slowly from bed. ? Avoid sudden movements during the day. ? Avoid extreme head movement, like looking up or bending over. Contact a health care provider if:  Your vertigo gets worse.  You have other symptoms, including: ? Nausea. ? Vomiting. ? Headache. Get help right away if:  You have vision changes.  You have a severe or worsening headache or neck pain.  You cannot stop vomiting.  You have new numbness or weakness in any part of your body. Summary  Vertigo is the feeling that you or your surroundings are moving when they are not.  The Epley maneuver is an exercise that relieves symptoms of vertigo.  If the Epley maneuver is done correctly, it is considered safe. You can do it up to 3 times a day. This information is not intended to replace advice given to you by your health care provider. Make sure you discuss any questions you have with your health  care provider. Document Revised: 02/01/2017 Document Reviewed: 01/10/2016 Elsevier Patient Education  2020 [employer].

## 2019-09-23 DIAGNOSIS — Z7409 Other reduced mobility: Secondary | ICD-10-CM | POA: Insufficient documentation

## 2019-09-23 NOTE — Progress Notes (Signed)
DME signed and given to [place].

## 2019-09-23 NOTE — Assessment & Plan Note (Signed)
We will work on ordering a wheelchair for her.

## 2019-09-23 NOTE — Addendum Note (Signed)
Addended by: Nanci Pina on: 09/23/2019 04:56 PM   Modules accepted: Orders

## 2019-09-23 NOTE — Addendum Note (Signed)
Addended by: Nanci Pina on: 09/23/2019 03:47 PM   Modules accepted: Orders

## 2019-09-24 ENCOUNTER — Telehealth: Payer: Self-pay

## 2019-09-24 NOTE — Telephone Encounter (Signed)
-----   Message from Nanci Pina, RN sent at 09/23/2019  4:56 PM EDT -----   ----- Message ----- From: [name], MD Sent: 09/23/2019   2:51 PM EDT To: Nanci Pina, RN  How would I order a wheel chair for this patient?

## 2019-09-25 ENCOUNTER — Ambulatory Visit: Payer: Medicare Other | Admitting: Family

## 2019-09-29 ENCOUNTER — Ambulatory Visit: Payer: Medicare Other | Admitting: Family Medicine

## 2019-09-30 NOTE — Progress Notes (Signed)
LVM for the patient to call back and give name of place to send DME for wheelchair.  Nassir Neidert,cma

## 2019-10-05 ENCOUNTER — Telehealth (INDEPENDENT_AMBULATORY_CARE_PROVIDER_SITE_OTHER): Payer: Self-pay

## 2019-10-05 NOTE — Telephone Encounter (Signed)
A fax was received from [facility] for permcath removal for the patient. Patient is scheduled with Dr. Delana Meyer on 10/08/19 with a 2:00 pm arrival time to the MM. Covid testing is on 10/06/19 between 8-1 pm at the [facility]. Pre-procedure instructions will be faxed to attention Misty at [facility].

## 2019-10-06 ENCOUNTER — Other Ambulatory Visit: Admission: RE | Admit: 2019-10-06 | Payer: Medicare Other | Source: Ambulatory Visit

## 2019-10-06 DIAGNOSIS — Z23 Encounter for immunization: Secondary | ICD-10-CM | POA: Diagnosis not present

## 2019-10-07 ENCOUNTER — Other Ambulatory Visit (INDEPENDENT_AMBULATORY_CARE_PROVIDER_SITE_OTHER): Payer: Self-pay | Admitting: Nurse Practitioner

## 2019-10-07 NOTE — Progress Notes (Signed)
Faxed to [facility] for wheelchair.  Akira Perusse,cma

## 2019-10-08 ENCOUNTER — Emergency Department: Payer: Medicare Other

## 2019-10-08 ENCOUNTER — Encounter: Payer: Self-pay | Admitting: Emergency Medicine

## 2019-10-08 ENCOUNTER — Other Ambulatory Visit: Payer: Self-pay

## 2019-10-08 ENCOUNTER — Ambulatory Visit: Admission: RE | Admit: 2019-10-08 | Payer: Medicaid Other | Source: Home / Self Care | Admitting: Vascular Surgery

## 2019-10-08 ENCOUNTER — Inpatient Hospital Stay
Admission: EM | Admit: 2019-10-08 | Discharge: 2019-10-15 | DRG: 391 | Disposition: A | Discharge status: Home / Self Care | Payer: Medicare Other | Attending: Internal Medicine | Admitting: Internal Medicine

## 2019-10-08 DIAGNOSIS — R4189 Other symptoms and signs involving cognitive functions and awareness: Secondary | ICD-10-CM

## 2019-10-08 DIAGNOSIS — R1013 Epigastric pain: Secondary | ICD-10-CM

## 2019-10-08 DIAGNOSIS — D631 Anemia in chronic kidney disease: Secondary | ICD-10-CM | POA: Diagnosis present

## 2019-10-08 DIAGNOSIS — Z881 Allergy status to other antibiotic agents status: Secondary | ICD-10-CM

## 2019-10-08 DIAGNOSIS — I132 Hypertensive heart and chronic kidney disease with heart failure and with stage 5 chronic kidney disease, or end stage renal disease: Secondary | ICD-10-CM | POA: Diagnosis present

## 2019-10-08 DIAGNOSIS — E1122 Type 2 diabetes mellitus with diabetic chronic kidney disease: Secondary | ICD-10-CM | POA: Diagnosis present

## 2019-10-08 DIAGNOSIS — E039 Hypothyroidism, unspecified: Secondary | ICD-10-CM | POA: Diagnosis not present

## 2019-10-08 DIAGNOSIS — I255 Ischemic cardiomyopathy: Secondary | ICD-10-CM | POA: Diagnosis present

## 2019-10-08 DIAGNOSIS — M479 Spondylosis, unspecified: Secondary | ICD-10-CM | POA: Diagnosis present

## 2019-10-08 DIAGNOSIS — Z79899 Other long term (current) drug therapy: Secondary | ICD-10-CM

## 2019-10-08 DIAGNOSIS — D62 Acute posthemorrhagic anemia: Secondary | ICD-10-CM | POA: Diagnosis not present

## 2019-10-08 DIAGNOSIS — K219 Gastro-esophageal reflux disease without esophagitis: Secondary | ICD-10-CM | POA: Diagnosis present

## 2019-10-08 DIAGNOSIS — I10 Essential (primary) hypertension: Secondary | ICD-10-CM

## 2019-10-08 DIAGNOSIS — R197 Diarrhea, unspecified: Secondary | ICD-10-CM | POA: Diagnosis not present

## 2019-10-08 DIAGNOSIS — D638 Anemia in other chronic diseases classified elsewhere: Secondary | ICD-10-CM

## 2019-10-08 DIAGNOSIS — I480 Paroxysmal atrial fibrillation: Secondary | ICD-10-CM

## 2019-10-08 DIAGNOSIS — N186 End stage renal disease: Secondary | ICD-10-CM | POA: Diagnosis not present

## 2019-10-08 DIAGNOSIS — I160 Hypertensive urgency: Secondary | ICD-10-CM | POA: Diagnosis not present

## 2019-10-08 DIAGNOSIS — K2940 Chronic atrophic gastritis without bleeding: Secondary | ICD-10-CM | POA: Diagnosis not present

## 2019-10-08 DIAGNOSIS — R404 Transient alteration of awareness: Secondary | ICD-10-CM

## 2019-10-08 DIAGNOSIS — R112 Nausea with vomiting, unspecified: Secondary | ICD-10-CM | POA: Diagnosis present

## 2019-10-08 DIAGNOSIS — E1140 Type 2 diabetes mellitus with diabetic neuropathy, unspecified: Secondary | ICD-10-CM | POA: Diagnosis present

## 2019-10-08 DIAGNOSIS — B9620 Unspecified Escherichia coli [E. coli] as the cause of diseases classified elsewhere: Secondary | ICD-10-CM | POA: Diagnosis present

## 2019-10-08 DIAGNOSIS — J9811 Atelectasis: Secondary | ICD-10-CM | POA: Diagnosis present

## 2019-10-08 DIAGNOSIS — I4581 Long QT syndrome: Secondary | ICD-10-CM | POA: Diagnosis not present

## 2019-10-08 DIAGNOSIS — Z7189 Other specified counseling: Secondary | ICD-10-CM | POA: Diagnosis not present

## 2019-10-08 DIAGNOSIS — Z888 Allergy status to other drugs, medicaments and biological substances status: Secondary | ICD-10-CM

## 2019-10-08 DIAGNOSIS — I2510 Atherosclerotic heart disease of native coronary artery without angina pectoris: Secondary | ICD-10-CM | POA: Diagnosis present

## 2019-10-08 DIAGNOSIS — Z66 Do not resuscitate: Secondary | ICD-10-CM | POA: Diagnosis not present

## 2019-10-08 DIAGNOSIS — I5032 Chronic diastolic (congestive) heart failure: Secondary | ICD-10-CM | POA: Diagnosis present

## 2019-10-08 DIAGNOSIS — R110 Nausea: Secondary | ICD-10-CM

## 2019-10-08 DIAGNOSIS — Z992 Dependence on renal dialysis: Secondary | ICD-10-CM

## 2019-10-08 DIAGNOSIS — N3000 Acute cystitis without hematuria: Secondary | ICD-10-CM | POA: Diagnosis present

## 2019-10-08 DIAGNOSIS — Z951 Presence of aortocoronary bypass graft: Secondary | ICD-10-CM | POA: Diagnosis not present

## 2019-10-08 DIAGNOSIS — Z8249 Family history of ischemic heart disease and other diseases of the circulatory system: Secondary | ICD-10-CM

## 2019-10-08 DIAGNOSIS — R109 Unspecified abdominal pain: Secondary | ICD-10-CM | POA: Diagnosis not present

## 2019-10-08 DIAGNOSIS — I252 Old myocardial infarction: Secondary | ICD-10-CM | POA: Diagnosis not present

## 2019-10-08 DIAGNOSIS — I272 Pulmonary hypertension, unspecified: Secondary | ICD-10-CM | POA: Diagnosis present

## 2019-10-08 DIAGNOSIS — R1084 Generalized abdominal pain: Secondary | ICD-10-CM | POA: Diagnosis not present

## 2019-10-08 DIAGNOSIS — E785 Hyperlipidemia, unspecified: Secondary | ICD-10-CM

## 2019-10-08 DIAGNOSIS — R0902 Hypoxemia: Secondary | ICD-10-CM | POA: Diagnosis not present

## 2019-10-08 DIAGNOSIS — I700 Atherosclerosis of aorta: Secondary | ICD-10-CM | POA: Diagnosis present

## 2019-10-08 DIAGNOSIS — N2581 Secondary hyperparathyroidism of renal origin: Secondary | ICD-10-CM | POA: Diagnosis present

## 2019-10-08 DIAGNOSIS — Z86718 Personal history of other venous thrombosis and embolism: Secondary | ICD-10-CM

## 2019-10-08 DIAGNOSIS — R5383 Other fatigue: Secondary | ICD-10-CM | POA: Diagnosis not present

## 2019-10-08 DIAGNOSIS — M1909 Primary osteoarthritis, other specified site: Secondary | ICD-10-CM | POA: Diagnosis present

## 2019-10-08 DIAGNOSIS — Z79890 Hormone replacement therapy (postmenopausal): Secondary | ICD-10-CM

## 2019-10-08 DIAGNOSIS — Z20822 Contact with and (suspected) exposure to covid-19: Secondary | ICD-10-CM | POA: Diagnosis present

## 2019-10-08 DIAGNOSIS — Z8614 Personal history of Methicillin resistant Staphylococcus aureus infection: Secondary | ICD-10-CM

## 2019-10-08 DIAGNOSIS — Z886 Allergy status to analgesic agent status: Secondary | ICD-10-CM

## 2019-10-08 DIAGNOSIS — Z8616 Personal history of COVID-19: Secondary | ICD-10-CM

## 2019-10-08 DIAGNOSIS — Z7901 Long term (current) use of anticoagulants: Secondary | ICD-10-CM

## 2019-10-08 DIAGNOSIS — Z833 Family history of diabetes mellitus: Secondary | ICD-10-CM

## 2019-10-08 DIAGNOSIS — Z515 Encounter for palliative care: Secondary | ICD-10-CM | POA: Diagnosis not present

## 2019-10-08 LAB — BLOOD GAS, VENOUS
Acid-Base Excess: 7.8 mmol/L — ABNORMAL HIGH (ref 0.0–2.0)
Bicarbonate: 36.1 mmol/L — ABNORMAL HIGH (ref 20.0–28.0)
O2 Saturation: 82.5 %
Patient temperature: 37
pCO2, Ven: 67 mmHg — ABNORMAL HIGH (ref 44.0–60.0)
pH, Ven: 7.34 (ref 7.250–7.430)
pO2, Ven: 50 mmHg — ABNORMAL HIGH (ref 32.0–45.0)

## 2019-10-08 LAB — LIPASE, BLOOD: Lipase: 17 U/L (ref 11–51)

## 2019-10-08 LAB — URINALYSIS, COMPLETE (UACMP) WITH MICROSCOPIC
Bilirubin Urine: NEGATIVE
Glucose, UA: NEGATIVE mg/dL
Ketones, ur: 5 mg/dL — AB
Nitrite: NEGATIVE
Protein, ur: >=300 mg/dL — AB
Specific Gravity, Urine: 1.01 (ref 1.005–1.030)
Squamous Epithelial / HPF: NONE SEEN (ref 0–5)
WBC, UA: >50 WBC/hpf — ABNORMAL HIGH (ref 0–5)
pH: 7 (ref 5.0–8.0)

## 2019-10-08 LAB — COMPREHENSIVE METABOLIC PANEL
ALT: 17 U/L (ref 0–44)
AST: 93 U/L — ABNORMAL HIGH (ref 15–41)
Albumin: 3.1 g/dL — ABNORMAL LOW (ref 3.5–5.0)
Alkaline Phosphatase: 50 U/L (ref 38–126)
Anion gap: 18 — ABNORMAL HIGH (ref 5–15)
BUN: 28 mg/dL — ABNORMAL HIGH (ref 8–23)
CO2: 29 mmol/L (ref 22–32)
Calcium: 9.5 mg/dL (ref 8.9–10.3)
Chloride: 94 mmol/L — ABNORMAL LOW (ref 98–111)
Creatinine, Ser: 5.46 mg/dL — ABNORMAL HIGH (ref 0.44–1.00)
GFR calc Af Amer: 9 mL/min — ABNORMAL LOW (ref >60)
GFR calc non Af Amer: 8 mL/min — ABNORMAL LOW (ref >60)
Glucose, Bld: 192 mg/dL — ABNORMAL HIGH (ref 70–99)
Potassium: 4.4 mmol/L (ref 3.5–5.1)
Sodium: 141 mmol/L (ref 135–145)
Total Bilirubin: 2.2 mg/dL — ABNORMAL HIGH (ref 0.3–1.2)
Total Protein: 7.4 g/dL (ref 6.5–8.1)

## 2019-10-08 LAB — TROPONIN I (HIGH SENSITIVITY)
Troponin I (High Sensitivity): 37 ng/L — ABNORMAL HIGH (ref <18)
Troponin I (High Sensitivity): 37 ng/L — ABNORMAL HIGH (ref <18)

## 2019-10-08 LAB — CBC
HCT: 25.2 % — ABNORMAL LOW (ref 36.0–46.0)
Hemoglobin: 8 g/dL — ABNORMAL LOW (ref 12.0–15.0)
MCH: 29.6 pg (ref 26.0–34.0)
MCHC: 31.7 g/dL (ref 30.0–36.0)
MCV: 93.3 fL (ref 80.0–100.0)
Platelets: 158 10*3/uL (ref 150–400)
RBC: 2.7 MIL/uL — ABNORMAL LOW (ref 3.87–5.11)
RDW: 22.2 % — ABNORMAL HIGH (ref 11.5–15.5)
WBC: 4.8 10*3/uL (ref 4.0–10.5)
nRBC: 0.6 % — ABNORMAL HIGH (ref 0.0–0.2)

## 2019-10-08 LAB — SARS CORONAVIRUS 2 BY RT PCR (HOSPITAL ORDER, PERFORMED IN ~~LOC~~ HOSPITAL LAB): SARS Coronavirus 2: NEGATIVE

## 2019-10-08 SURGERY — DIALYSIS/PERMA CATHETER REMOVAL
Anesthesia: LOCAL

## 2019-10-08 MED ORDER — OXYCODONE HCL 5 MG PO TABS
5.0000 mg | ORAL_TABLET | ORAL | Status: DC | PRN
  Administered 2019-10-09 – 2019-10-11 (×6): 5 mg via ORAL
  Filled 2019-10-08 (×6): qty 1

## 2019-10-08 MED ORDER — APIXABAN 5 MG PO TABS: 5.0000 mg | ORAL_TABLET | Freq: Two times a day (BID) | ORAL | Status: DC

## 2019-10-08 MED ORDER — IOHEXOL 300 MG/ML  SOLN
100.0000 mL | Freq: Once | INTRAMUSCULAR | Status: AC | PRN
  Administered 2019-10-08: 100 mL via INTRAVENOUS

## 2019-10-08 MED ORDER — ONDANSETRON HCL 4 MG PO TABS
4.0000 mg | ORAL_TABLET | Freq: Four times a day (QID) | ORAL | Status: DC | PRN
  Administered 2019-10-10: 4 mg via ORAL
  Filled 2019-10-08: qty 1

## 2019-10-08 MED ORDER — ONDANSETRON HCL 4 MG/2ML IJ SOLN
INTRAMUSCULAR | Status: AC
  Filled 2019-10-08: qty 2

## 2019-10-08 MED ORDER — MELATONIN 5 MG PO TABS
5.0000 mg | ORAL_TABLET | Freq: Every evening | ORAL | Status: DC | PRN
  Filled 2019-10-08: qty 1

## 2019-10-08 MED ORDER — FLUOXETINE HCL 20 MG PO CAPS
20.0000 mg | ORAL_CAPSULE | Freq: Every day | ORAL | Status: DC
  Administered 2019-10-09 – 2019-10-15 (×6): 20 mg via ORAL
  Filled 2019-10-08 (×8): qty 1

## 2019-10-08 MED ORDER — AMIODARONE HCL 200 MG PO TABS
200.0000 mg | ORAL_TABLET | Freq: Every day | ORAL | Status: DC
  Administered 2019-10-09 – 2019-10-15 (×7): 200 mg via ORAL
  Filled 2019-10-08 (×8): qty 1

## 2019-10-08 MED ORDER — HYDRALAZINE HCL 10 MG PO TABS
10.0000 mg | ORAL_TABLET | Freq: Two times a day (BID) | ORAL | Status: DC
  Administered 2019-10-09 – 2019-10-15 (×9): 10 mg via ORAL
  Filled 2019-10-08 (×15): qty 1

## 2019-10-08 MED ORDER — NITROGLYCERIN 0.4 MG SL SUBL: 0.4000 mg | SUBLINGUAL_TABLET | SUBLINGUAL | Status: DC | PRN

## 2019-10-08 MED ORDER — ONDANSETRON HCL 4 MG/2ML IJ SOLN
4.0000 mg | Freq: Once | INTRAMUSCULAR | Status: AC
  Administered 2019-10-08: 4 mg via INTRAVENOUS

## 2019-10-08 MED ORDER — LEVOTHYROXINE SODIUM 88 MCG PO TABS
88.0000 ug | ORAL_TABLET | Freq: Every day | ORAL | Status: DC
  Administered 2019-10-09 – 2019-10-15 (×6): 88 ug via ORAL
  Filled 2019-10-08 (×8): qty 1

## 2019-10-08 MED ORDER — FLUTICASONE PROPIONATE 50 MCG/ACT NA SUSP
2.0000 | Freq: Every day | NASAL | Status: DC | PRN
  Filled 2019-10-08: qty 16

## 2019-10-08 MED ORDER — ACETAMINOPHEN 325 MG PO TABS
650.0000 mg | ORAL_TABLET | Freq: Every day | ORAL | Status: DC | PRN
  Administered 2019-10-10 – 2019-10-12 (×3): 650 mg via ORAL
  Filled 2019-10-08 (×4): qty 2

## 2019-10-08 MED ORDER — SODIUM CHLORIDE 0.9% FLUSH: 3.0000 mL | Freq: Once | INTRAVENOUS | Status: DC

## 2019-10-08 MED ORDER — PANTOPRAZOLE SODIUM 40 MG PO TBEC: 40.0000 mg | DELAYED_RELEASE_TABLET | Freq: Every day | ORAL | Status: DC

## 2019-10-08 MED ORDER — TRAZODONE HCL 50 MG PO TABS
50.0000 mg | ORAL_TABLET | Freq: Every day | ORAL | Status: DC
  Administered 2019-10-09 – 2019-10-13 (×5): 50 mg via ORAL
  Filled 2019-10-08 (×7): qty 1

## 2019-10-08 MED ORDER — HYDROMORPHONE HCL 1 MG/ML IJ SOLN
0.5000 mg | Freq: Once | INTRAMUSCULAR | Status: AC
  Administered 2019-10-08: 0.5 mg via INTRAVENOUS
  Filled 2019-10-08: qty 1

## 2019-10-08 MED ORDER — CHLORHEXIDINE GLUCONATE CLOTH 2 % EX PADS
6.0000 | MEDICATED_PAD | Freq: Every day | CUTANEOUS | Status: DC
  Administered 2019-10-09 – 2019-10-13 (×4): 6 via TOPICAL
  Filled 2019-10-08: qty 6

## 2019-10-08 MED ORDER — ONDANSETRON HCL 4 MG/2ML IJ SOLN
4.0000 mg | Freq: Once | INTRAMUSCULAR | Status: AC
  Administered 2019-10-08: 4 mg via INTRAVENOUS
  Filled 2019-10-08: qty 2

## 2019-10-08 MED ORDER — NALOXONE HCL 2 MG/2ML IJ SOSY
0.4000 mg | PREFILLED_SYRINGE | INTRAMUSCULAR | Status: DC | PRN
  Administered 2019-10-11: 0.4 mg via INTRAVENOUS
  Filled 2019-10-08 (×2): qty 2

## 2019-10-08 MED ORDER — ATORVASTATIN CALCIUM 20 MG PO TABS
40.0000 mg | ORAL_TABLET | Freq: Every day | ORAL | Status: DC
  Administered 2019-10-09 – 2019-10-11 (×3): 40 mg via ORAL
  Filled 2019-10-08 (×4): qty 2

## 2019-10-08 MED ORDER — ONDANSETRON HCL 4 MG/2ML IJ SOLN
4.0000 mg | Freq: Four times a day (QID) | INTRAMUSCULAR | Status: DC | PRN
  Administered 2019-10-09 – 2019-10-11 (×2): 4 mg via INTRAVENOUS
  Filled 2019-10-08 (×3): qty 2

## 2019-10-08 MED ORDER — FOLIC ACID 1 MG PO TABS
1.0000 mg | ORAL_TABLET | Freq: Every day | ORAL | Status: DC
  Administered 2019-10-09 – 2019-10-15 (×6): 1 mg via ORAL
  Filled 2019-10-08 (×7): qty 1

## 2019-10-08 MED ORDER — RENA-VITE PO TABS
1.0000 | ORAL_TABLET | Freq: Every day | ORAL | Status: DC
  Administered 2019-10-09 – 2019-10-14 (×5): 1 via ORAL
  Filled 2019-10-08 (×6): qty 1

## 2019-10-08 NOTE — H&P (Signed)
[facility] NAME: Ghazal Pevey    MR#:  [id number]  DATE OF BIRTH:  [birth year]  DATE OF ADMISSION:  10/08/2019  PRIMARY CARE PHYSICIAN: [name], MD   REQUESTING/REFERRING PHYSICIAN: Dr Duffy Bruce  CHIEF COMPLAINT:   Chief Complaint  Patient presents with  . Abdominal Pain    HISTORY OF PRESENT ILLNESS:  Jamie Arias  is a 64 y.o. female with a known history of end-stage renal disease, hypertension and CAD presents with not feeling well since Tuesday night.  She had severe abdominal pain nausea dry heaves and diarrhea.  Diarrhea 5-6 times per day.  She was too sick to go to dialysis yesterday.  She has been fatigued and not able to eat.  The ER physician gave her 0.5 of Dilaudid for abdominal pain and she is lethargic right now.  The patient's husband says she was mentating fine prior to the pain medication.  History obtained from husband at the bedside.  Of note patient recently finished up her Covid vaccination.  She did have a Covid infection last year.  PAST MEDICAL HISTORY:   Past Medical History:  Diagnosis Date  . Anemia   . Anginal pain ([place])   . Arthritis    upper back and neck   . Chronic diastolic CHF (congestive heart failure) ([place])    a. Due to ischemic cardiomyopathy. EF as low as 35%, improved to normal s/p CABG; b. echo 07/06/13: EF 55-60%, no RWMAs, mod TR, trivial pericardial effusion not c/w tamponade physiology;  c. 10/2015 Echo: EF 65%, Gr1 DD, triv AI, mild MR, mildly dil LA, mod TR, PASP 56mmHg.  Marland Kitchen Coronary artery disease    a. NSTEMI 06/2013; b.cath: severe three-vessel CAD w/ EF 30% & mild-mod MR; c. s/p 3 vessel CABG 07/02/13 (LIMA-LAD, SVG-OM, and SVG-RPDA);  d. 10/2015 MV: no ischemia/infarct.  . Diabetes mellitus without complication ([place])   . Diabetic neuropathy ([place])   . Dialysis patient [facility])    MWF  . ESRD (end stage renal disease) ([place])    a. 12/2015 initiated - mwf dialysis.  Marland Kitchen GERD  (gastroesophageal reflux disease)   . Hyperlipidemia   . Hypertension   . Hypothyroidism   . Myocardial infarction ([place]) 2014  . Neuropathy   . Pleural effusion 2015  . Pulmonary hypertension ([place])   . Renal insufficiency   . Wears dentures    full lower    PAST SURGICAL HISTORY:   Past Surgical History:  Procedure Laterality Date  . A/V FISTULAGRAM Right 12/17/2016   Procedure: A/V Fistulagram;  Surgeon: Algernon Huxley, MD;  Location: [place] [facility];  Service: Cardiovascular;  Laterality: Right;  . A/V FISTULAGRAM Right 01/07/2017   Procedure: A/V Fistulagram;  Surgeon: Algernon Huxley, MD;  Location: [place] [facility];  Service: Cardiovascular;  Laterality: Right;  . A/V FISTULAGRAM Right 12/03/2017   Procedure: A/V FISTULAGRAM;  Surgeon: Katha Cabal, MD;  Location: [place] [facility];  Service: Cardiovascular;  Laterality: Right;  . A/V FISTULAGRAM Right 02/23/2019   Procedure: A/V Fistulagram;  Surgeon: Algernon Huxley, MD;  Location: [place] [facility];  Service: Cardiovascular;  Laterality: Right;  . A/V SHUNT INTERVENTION N/A 02/22/2017   Procedure: A/V SHUNT INTERVENTION;  Surgeon: Algernon Huxley, MD;  Location: [place] [facility];  Service: Cardiovascular;  Laterality: N/A;  . AV FISTULA PLACEMENT Right 02/03/2016   Procedure: INSERTION OF ARTERIOVENOUS (AV) GORE-TEX GRAFT ARM ( BRACH /  AXILLARY );  Surgeon: Katha Cabal, MD;  Location: [facility];  Service: Vascular;  Laterality: Right;  . AV FISTULA PLACEMENT Left 07/09/2019   Procedure: INSERTION OF ARTERIOVENOUS (AV) GORE-TEX GRAFT ARM ( BRACHIAL AXILLARY );  Surgeon: Algernon Huxley, MD;  Location: [facility];  Service: Vascular;  Laterality: Left;  . [place] REMOVAL Right 03/04/2019   Procedure: REMOVAL OF ARTERIOVENOUS GORETEX GRAFT ([place]);  Surgeon: Algernon Huxley, MD;  Location: [facility];  Service: Vascular;  Laterality: Right;  . CARDIAC CATHETERIZATION    . CATARACT EXTRACTION Bilateral   . CHOLECYSTECTOMY N/A  12/09/2014   Procedure: LAPAROSCOPIC CHOLECYSTECTOMY;  Surgeon: Marlyce [name], MD;  Location: [facility];  Service: General;  Laterality: N/A;  . CORONARY ARTERY BYPASS GRAFT N/A 07/02/2013   Procedure: CORONARY ARTERY BYPASS GRAFTING (CABG);  Surgeon: Ivin [name], MD;  Location: [place];  Service: Open Heart Surgery;  Laterality: N/A;  CABG x three, using left internal mammary artery and right leg greater saphenous vein harvested endoscopically  . ESOPHAGOGASTRODUODENOSCOPY N/A 04/11/2019   Procedure: ESOPHAGOGASTRODUODENOSCOPY (EGD);  Surgeon: Jonathon Bellows, MD;  Location: [facility] END[facility];  Service: Gastroenterology;  Laterality: N/A;  . ESOPHAGOGASTRODUODENOSCOPY (EGD) WITH PROPOFOL N/A 11/24/2015   Procedure: ESOPHAGOGASTRODUODENOSCOPY (EGD) WITH PROPOFOL;  Surgeon: [name], MD;  Location: [place];  Service: Endoscopy;  Laterality: N/A;  Diabetic - insulin  . EYE SURGERY Bilateral    Cataract Extraction with IOL  . INTRAOPERATIVE TRANSESOPHAGEAL ECHOCARDIOGRAM N/A 07/02/2013   Procedure: INTRAOPERATIVE TRANSESOPHAGEAL ECHOCARDIOGRAM;  Surgeon: Ivin [name], MD;  Location: [place];  Service: Open Heart Surgery;  Laterality: N/A;  . PEG PLACEMENT N/A 03/03/2019   Procedure: PERCUTANEOUS ENDOSCOPIC GASTROSTOMY (PEG) PLACEMENT;  Surgeon: Virgel Manifold, MD;  Location: [facility] ENDOSCOPY;  Service: Endoscopy;  Laterality: N/A;  . PERIPHERAL VASCULAR CATHETERIZATION Right 12/06/2015   Procedure: Dialysis/Perma Catheter Insertion;  Surgeon: Katha Cabal, MD;  Location: [place] [facility];  Service: Cardiovascular;  Laterality: Right;  . PERIPHERAL VASCULAR THROMBECTOMY Right 09/28/2016   Procedure: Peripheral Vascular Thrombectomy;  Surgeon: Katha Cabal, MD;  Location: [place] [facility];  Service: Cardiovascular;  Laterality: Right;  . PERIPHERAL VASCULAR THROMBECTOMY Right 05/20/2018   Procedure: PERIPHERAL VASCULAR THROMBECTOMY;  Surgeon: Katha Cabal, MD;   Location: [place] [facility];  Service: Cardiovascular;  Laterality: Right;  . PORTA CATH REMOVAL N/A 06/01/2016   Procedure: [place]i [name] Cath Removal;  Surgeon: Katha Cabal, MD;  Location: [place] [facility];  Service: Cardiovascular;  Laterality: N/A;  . THORACENTESIS Left 2015    SOCIAL HISTORY:   Social History   Tobacco Use  . Smoking status: Never Smoker  . Smokeless tobacco: Never Used  Substance Use Topics  . Alcohol use: No    Alcohol/week: 0.0 standard drinks    FAMILY HISTORY:   Family History  Problem Relation Age of Onset  . COPD Mother   . Cancer Mother        Lung  . Pulmonary embolism Father   . Diabetes Father   . Diabetes Paternal Grandfather   . Heart disease Maternal Grandmother   . Colon cancer Neg Hx   . Colon polyps Neg Hx   . Esophageal cancer Neg Hx   . Pancreatic cancer Neg Hx   . Liver disease Neg Hx     DRUG ALLERGIES:   Allergies  Allergen Reactions  . Nsaids Other (See Comments)    Contraindicated due to kidney disease.  Judeth Cornfield Reductase Inhibitors   .  Doxycycline Other (See Comments)    tremor    REVIEW OF SYSTEMS:  Patient unable to provide review of systems at this time secondary to being given pain medications prior to me seeing her.  MEDICATIONS AT HOME:   Prior to Admission medications   Medication Sig Start Date End Date Taking? Authorizing Provider  atorvastatin (LIPITOR) 40 MG tablet Take 1 tablet (40 mg total) by mouth daily. 08/28/19  Yes [name], MD  ELIQUIS 5 MG TABS tablet Take 1 tablet (5 mg total) by mouth 2 (two) times daily. 09/14/19  Yes [name], MD  FLUoxetine (PROZAC) 20 MG capsule Take 1 capsule (20 mg total) by mouth daily. 06/30/19  Yes [name], MD  levothyroxine (SYNTHROID) 88 MCG tablet Take 1 tablet (88 mcg total) by mouth daily before breakfast. 08/28/19  Yes [name], MD  Melatonin 3 MG TABS Take 3 mg by mouth at bedtime as needed (sleep).    Yes [provider]  multivitamin (RENA-VIT) TABS tablet Take 1 tablet by mouth at bedtime. 09/09/18  Yes Hosie Poisson, MD  nitroGLYCERIN (NITROSTAT) 0.4 MG SL tablet DISSOLVE ONE TABLET UNDER THE TONGUE EVERY 5 MINUTES AS NEEDED FOR CHEST PAIN.  DO NOT EXCEED A TOTAL OF 3 DOSES IN 15 MINUTES Patient taking differently: Place 0.4 mg under the tongue every 5 (five) minutes as needed for chest pain. DISSOLVE ONE TABLET UNDER THE TONGUE EVERY 5 MINUTES AS NEEDED FOR CHEST PAIN.  DO NOT EXCEED A TOTAL OF 3 DOSES IN 15 MINUTES 08/18/18  Yes Wellington Hampshire, MD  omeprazole (PRILOSEC) 20 MG capsule Take 20 mg by mouth daily.   Yes [provider]  ondansetron (ZOFRAN-ODT) 4 MG disintegrating tablet Take 1 tablet (4 mg total) by mouth every 8 (eight) hours as needed for nausea or vomiting. Patient needs appointment with PCP to receive further refills. 08/07/19  Yes [name], MD  PACERONE 200 MG tablet TAKE 1 TABLET BY MOUTH ONCE DAILY. PATIENT NEEDS APPOINTMENT WITH PCP FOR FURTHER REFILLS Patient taking differently: Take 200 mg by mouth daily.  09/21/19  Yes [name], MD  traZODone (DESYREL) 50 MG tablet TAKE 1 TABLET BY MOUTH AT BEDTIME. PATIENT NEEDS APPOINTMENT WITH PCP FOR FURTHER REFILLS Patient taking differently: Take 50 mg by mouth at bedtime.  09/14/19  Yes [name], MD  vitamin C (VITAMIN C) 500 MG tablet Take 1 tablet (500 mg total) by mouth daily. 09/10/18  Yes Hosie Poisson, MD  acetaminophen (TYLENOL) 325 MG tablet Take 650 mg by mouth daily as needed for moderate pain or headache.     [provider]  fluticasone (FLONASE) 50 MCG/ACT nasal spray Place 2 sprays into both nostrils daily. Patient taking differently: Place 2 sprays into both nostrils daily as needed for allergies or rhinitis.  09/17/17   [name], MD  folic acid (FOLVITE) 1 MG tablet Take 1 tablet (1 mg total) by mouth daily. 04/21/19   Sidney Ace, MD  gabapentin (NEURONTIN)  100 MG capsule Take 100 mg by mouth 3 (three) times daily.    [provider]  hydrALAZINE (APRESOLINE) 10 MG tablet Take 1 tablet (10 mg total) by mouth in the morning and at bedtime. 06/23/19   Marrianne Mood D, PA-C  hydrocortisone cream 1 % Apply topically as needed for itching. 04/20/19   Sidney Ace, MD   Medication reconciliation still undergoing. Spoke with pharmacy to get pharmacy tech  to do the medication reconciliation.  VITAL SIGNS:  Blood pressure (!) 191/81, pulse 82, resp. rate 16, height 5\' 4"  (1.626 m), weight 77.6 kg, SpO2 95 %.  PHYSICAL EXAMINATION:  GENERAL:  64 y.o.-year-old patient lying in the bed with no acute distress.  EYES: Pupils equal, round, reactive to light and accommodation. No scleral icterus. HEENT: Head atraumatic, normocephalic. Oropharynx and nasopharynx clear.  NECK:  Supple, no jugular venous distention.  LUNGS: Decreased breath sounds bilaterally, no wheezing, rales,rhonchi or crepitation. No use of accessory muscles of respiration.  CARDIOVASCULAR: S1, S2 normal. No murmurs, rubs, or gallops.  ABDOMEN: Soft, nontender, nondistended. Bowel sounds present. No organomegaly or mass.  EXTREMITIES: No pedal edema, cyanosis, or clubbing.  NEUROLOGIC: Patient lethargic after pain medication.  Opened her eyes but went back to sleep PSYCHIATRIC: The patient is lethargic after pain medication.  SKIN: No rash, lesion, or ulcer.   LABORATORY PANEL:   CBC Recent Labs  Lab 10/08/19 1111  WBC 4.8  HGB 8.0*  HCT 25.2*  PLT 158   ------------------------------------------------------------------------------------------------------------------  Chemistries  Recent Labs  Lab 10/08/19 1111  NA 141  K 4.4  CL 94*  CO2 29  GLUCOSE 192*  BUN 28*  CREATININE 5.46*  CALCIUM 9.5  AST 93*  ALT 17  ALKPHOS 50  BILITOT 2.2*    ------------------------------------------------------------------------------------------------------------------   RADIOLOGY:  CT ABDOMEN PELVIS W CONTRAST  Result Date: 10/08/2019 CLINICAL DATA:  Acute abdominal pain. EXAM: CT ABDOMEN AND PELVIS WITH CONTRAST TECHNIQUE: Multidetector CT imaging of the abdomen and pelvis was performed using the standard protocol following bolus administration of intravenous contrast. CONTRAST:  164mL OMNIPAQUE IOHEXOL 300 MG/ML  SOLN COMPARISON:  June 29, 2019. FINDINGS: Lower chest: Small right pleural effusion is noted with adjacent subsegmental atelectasis. Left basilar atelectasis is noted as well. Hepatobiliary: No focal liver abnormality is seen. Status post cholecystectomy. No biliary dilatation. Pancreas: Unremarkable. No pancreatic ductal dilatation or surrounding inflammatory changes. Spleen: Normal in size without focal abnormality. Adrenals/Urinary Tract: Adrenal glands appear normal. Bilateral renal atrophy is noted. No hydronephrosis or renal obstruction is noted. No renal or ureteral calculi are noted. Mild bladder wall thickening is noted which may represent cystitis. Stomach/Bowel: Stomach is within normal limits. Appendix appears normal. No evidence of bowel wall thickening, distention, or inflammatory changes. Vascular/Lymphatic: Aortic atherosclerosis. No enlarged abdominal or pelvic lymph nodes. Reproductive: Uterus and bilateral adnexa are unremarkable. Other: No abdominal wall hernia or abnormality. No abdominopelvic ascites. Musculoskeletal: No acute or significant osseous findings. IMPRESSION: 1. Small right pleural effusion is noted with adjacent subsegmental atelectasis. Left basilar atelectasis is noted as well. 2. Bilateral renal atrophy is noted. 3. Mild bladder wall thickening is noted which may represent cystitis. 4. No other significant abnormality seen in the abdomen or pelvis. Aortic Atherosclerosis (ICD10-I70.0). Electronically Signed    By: Marijo Conception M.D.   On: 10/08/2019 13:35   DG Chest Portable 1 View  Result Date: 10/08/2019 CLINICAL DATA:  Cough and short of breath.  Dialysis patient. EXAM: PORTABLE CHEST 1 VIEW COMPARISON:  06/29/2019 FINDINGS: Cardiac enlargement. Prior CABG. Left jugular central venous catheter tip in the proximal SVC unchanged. Pulmonary vascular congestion. No edema. Left pleural thickening unchanged consistent with pleural scarring as noted on CT. Mild left lower lobe atelectasis. IMPRESSION: Pulmonary vascular congestion without edema. No change in small left   pleural thickening. Electronically Signed   By: Franchot Gallo M.D.   On: 10/08/2019 13:52    EKG:   Normal  sinus rhythm 80 bpm low voltage.  IMPRESSION AND PLAN:   1.  Nausea, vomiting and diarrhea with abdominal pain.  Could be secondary to Covid vaccination.  We will send off stool for C. difficile colitis and stool comprehensive panel.  As needed nausea medications.  CT scan of the abdomen pelvis reviewed. 2.  Lethargy secondary to pain medication.  Narcan as needed if she does not wake up. 3.  Essential hypertension.  Blood pressure high but supposed to have dialysis tonight.  Continue hydralazine. 4.  End-stage renal disease.  Had CT scan with contrast so will need dialysis soon.  Nephrology to set up for dialysis but waiting for Covid test to come back first. 5.  Paroxysmal atrial fibrillation on Eliquis and amiodarone. 6.  Hypothyroidism unspecified on Synthroid 7.  Hyperlipidemia unspecified on Lipitor  All the records, laboratory and radiological data are reviewed and case discussed with ED provider. Management plans discussed with the patient, family and they are in agreement.  Patient meets inpatient criteria and will need 2 nights here in the hospital.  Patient admitted with nausea vomiting diarrhea.  Stool studies ordered.  Patient too sick to go to dialysis yesterday.  Unresponsive and lethargic right now secondary to  pain medications given in the emergency room.  Patient will need to tolerate solid food and diarrhea needs to settle down prior to going home.  CODE STATUS: Full code  TOTAL TIME TAKING CARE OF THIS PATIENT: 50 minutes.    Loletha Grayer M.D on 10/08/2019 at 3:08 PM  Between 7am to 6pm - Pager - [phone]  After 6pm call admission pager [phone]  [facility]  CC: Primary care physician; [name], MD

## 2019-10-08 NOTE — ED Notes (Signed)
Pt being transported to dialysis at this time

## 2019-10-08 NOTE — ED Notes (Signed)
Pt is Monday, Wednesday Friday dialysis. Pt states she didn't get dialysis yesterday.

## 2019-10-08 NOTE — ED Provider Notes (Signed)
[facility] Emergency Department Provider Note  ____________________________________________   First MD Initiated Contact with Patient 10/08/19 1220     (approximate)  I have reviewed the triage vital signs and the nursing notes.   HISTORY  Chief Complaint Abdominal Pain    HPI Jamie Arias is a 64 y.o. female  Here with severe epigastric abdominal pain. Began gradually 3-4 days ago, describes it as aching and gnawing, intermittently cramping. She's had associated nausea, diarrhea (liquid) 10x per day. Has tried zofran w/ relief of nausea, pepto without relief. No known sick contacts. No suspicious food intakes. Last HD was Monday, didn't go yesterday because she wasn't feeling well. Per report, has appt scheduled for VasCath removal today. No fever, chills. No CP, SOB, cough.       Past Medical History:  Diagnosis Date  . Anemia   . Anginal pain ([place])   . Arthritis    upper back and neck   . Chronic diastolic CHF (congestive heart failure) ([place])    a. Due to ischemic cardiomyopathy. EF as low as 35%, improved to normal s/p CABG; b. echo 07/06/13: EF 55-60%, no RWMAs, mod TR, trivial pericardial effusion not c/w tamponade physiology;  c. 10/2015 Echo: EF 65%, Gr1 DD, triv AI, mild MR, mildly dil LA, mod TR, PASP 14mmHg.  Marland Kitchen Coronary artery disease    a. NSTEMI 06/2013; b.cath: severe three-vessel CAD w/ EF 30% & mild-mod MR; c. s/p 3 vessel CABG 07/02/13 (LIMA-LAD, SVG-OM, and SVG-RPDA);  d. 10/2015 MV: no ischemia/infarct.  . Diabetes mellitus without complication ([place])   . Diabetic neuropathy ([place])   . Dialysis patient [facility])    MWF  . ESRD (end stage renal disease) ([place])    a. 12/2015 initiated - mwf dialysis.  Marland Kitchen GERD (gastroesophageal reflux disease)   . Hyperlipidemia   . Hypertension   . Hypothyroidism   . Myocardial infarction ([place]) 2014  . Neuropathy   . Pleural effusion 2015  . Pulmonary hypertension ([place])   . Renal insufficiency   . Wears  dentures    full lower    Patient Active Problem List   Diagnosis Date Noted  . Abdominal pain   . Lethargy   . Mobility impaired 09/23/2019  . Complication of vascular dialysis catheter 09/09/2019  . History of 2019 novel coronavirus disease (COVID-19) 07/14/2019  . Malaise and fatigue 07/14/2019  . Chest pain 06/29/2019  . Hypertensive urgency 06/29/2019  . Aftercare including intermittent dialysis ([place]) 05/26/2019  . Inappropriate diet and eating habits 04/30/2019  . Gastro-esophageal reflux disease with esophagitis, without bleeding 04/22/2019  . Pleural effusion, not elsewhere classified 04/22/2019  . Pulmonary hypertension, unspecified ([place]) 04/22/2019  . Palliative care by specialist   . Goals of care, counseling/discussion   . Abscess   . AF (paroxysmal atrial fibrillation) ([place])   . Nausea vomiting and diarrhea   . Oropharyngeal dysphagia   . Acute metabolic encephalopathy   . Acute blood loss anemia   . Arm DVT (deep venous thromboembolism), acute, left ([place]) 02/13/2019  . Acute encephalopathy 02/13/2019  . Decubitus ulcer of heel, bilateral, stage 2 ([place]) 02/10/2019  . Sepsis due to methicillin resistant Staphylococcus aureus (MRSA) ([place]) 02/10/2019  . MRSA bacteremia 02/10/2019  . Altered level of consciousness 02/09/2019  . 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 Anemia in chronic kidney disease 01/17/2019  . Coagulation defect, unspecified ([place]) 01/17/2019  . Fever, unspecified 01/17/2019  . Headache, unspecified 01/17/2019  . Iron deficiency anemia, unspecified 01/17/2019  .  Moderate protein-calorie malnutrition ([place]) 01/17/2019  . Other fluid overload 01/17/2019  . Pain, unspecified 01/17/2019  . Pruritus, unspecified 01/17/2019  . Secondary hyperparathyroidism of renal origin ([place]) 01/17/2019  . Shortness of breath 01/17/2019  . Altered mental status, unspecified 09/19/2018  . COVID-19 virus detected 09/19/2018  . Adjustment disorder with depressed mood  08/31/2018  . COVID-19 virus infection 08/24/2018  . Acute on chronic respiratory failure with hypoxia ([place]) 08/24/2018  . Allergic rhinitis 09/19/2017  . Chronic neck pain 07/25/2017  . Hyperkalemia 02/15/2017  . Complication of vascular dialysis catheter, initial encounter 12/01/2016  . ESRD (end stage renal disease) ([place]) 01/19/2016  . GI bleed 10/25/2015  . Hypertensive heart disease 10/24/2015  . Chronic diastolic CHF (congestive heart failure) ([place]) 10/24/2015  . Unstable angina ([place]) 10/23/2015  . Hypotension 09/02/2015  . Chronic systolic CHF (congestive heart failure) ([employer]) 09/02/2015  . Depression 07/27/2015  . Calculus of gallbladder with chronic cholecystitis without obstruction   . Bilateral carotid bruits 11/30/2014  . Low magnesium levels 08/10/2013  . Anemia 08/10/2013  . Constipation 08/10/2013  . Atrial fibrillation, chronic ([place]) 07/30/2013  . Coronary artery disease   . CAD (coronary artery disease) 07/02/2013  . Acute systolic heart failure ([place]) 06/30/2013  . NSTEMI (non-ST elevated myocardial infarction) ([place]) 06/29/2013  . Vertigo 08/25/2012  . Sleep disorder 05/23/2012  . Hypothyroid 04/27/2012  . HTN (hypertension) 04/27/2012  . HLD (hyperlipidemia) 04/27/2012  . Type 2 diabetes mellitus with ESRD (end-stage renal disease) ([place]) 04/27/2012  . Neuropathy 04/27/2012    Past Surgical History:  Procedure Laterality Date  . A/V FISTULAGRAM Right 12/17/2016   Procedure: A/V Fistulagram;  Surgeon: Algernon Huxley, MD;  Location: [place] [facility];  Service: Cardiovascular;  Laterality: Right;  . A/V FISTULAGRAM Right 01/07/2017   Procedure: A/V Fistulagram;  Surgeon: Algernon Huxley, MD;  Location: [place] [facility];  Service: Cardiovascular;  Laterality: Right;  . A/V FISTULAGRAM Right 12/03/2017   Procedure: A/V FISTULAGRAM;  Surgeon: Katha Cabal, MD;  Location: [place] [facility];  Service: Cardiovascular;  Laterality: Right;  . A/V FISTULAGRAM  Right 02/23/2019   Procedure: A/V Fistulagram;  Surgeon: Algernon Huxley, MD;  Location: [place] [facility];  Service: Cardiovascular;  Laterality: Right;  . A/V SHUNT INTERVENTION N/A 02/22/2017   Procedure: A/V SHUNT INTERVENTION;  Surgeon: Algernon Huxley, MD;  Location: [place] [facility];  Service: Cardiovascular;  Laterality: N/A;  . AV FISTULA PLACEMENT Right 02/03/2016   Procedure: INSERTION OF ARTERIOVENOUS (AV) GORE-TEX GRAFT ARM ( BRACH / AXILLARY );  Surgeon: Katha Cabal, MD;  Location: [facility];  Service: Vascular;  Laterality: Right;  . AV FISTULA PLACEMENT Left 07/09/2019   Procedure: INSERTION OF ARTERIOVENOUS (AV) GORE-TEX GRAFT ARM ( BRACHIAL AXILLARY );  Surgeon: Algernon Huxley, MD;  Location: [facility];  Service: Vascular;  Laterality: Left;  . [place] REMOVAL Right 03/04/2019   Procedure: REMOVAL OF ARTERIOVENOUS GORETEX GRAFT ([place]);  Surgeon: Algernon Huxley, MD;  Location: [facility];  Service: Vascular;  Laterality: Right;  . CARDIAC CATHETERIZATION    . CATARACT EXTRACTION Bilateral   . CHOLECYSTECTOMY N/A 12/09/2014   Procedure: LAPAROSCOPIC CHOLECYSTECTOMY;  Surgeon: Marlyce [name], MD;  Location: [facility];  Service: General;  Laterality: N/A;  . CORONARY ARTERY BYPASS GRAFT N/A 07/02/2013   Procedure: CORONARY ARTERY BYPASS GRAFTING (CABG);  Surgeon: Ivin [name], MD;  Location: [place];  Service: Open Heart Surgery;  Laterality: N/A;  CABG x  three, using left internal mammary artery and right leg greater saphenous vein harvested endoscopically  . ESOPHAGOGASTRODUODENOSCOPY N/A 04/11/2019   Procedure: ESOPHAGOGASTRODUODENOSCOPY (EGD);  Surgeon: Jonathon Bellows, MD;  Location[facility];  Service: Gastroenterology;  Laterality: N/A;  . ESOPHAGOGASTRODUODENOSCOPY (EGD) WITH PROPOFOL N/A 11/24/2015   Procedure: ESOPHAGOGASTRODUODENOSCOPY (EGD) WITH PROPOFOL;  Surgeon: [name], MD;  Location: [place];  Service: Endoscopy;  Laterality: N/A;  Diabetic - insulin  .  EYE SURGERY Bilateral    Cataract Extraction with IOL  . INTRAOPERATIVE TRANSESOPHAGEAL ECHOCARDIOGRAM N/A 07/02/2013   Procedure: INTRAOPERATIVE TRANSESOPHAGEAL ECHOCARDIOGRAM;  Surgeon: Ivin [name], MD;  Location: [place];  Service: Open Heart Surgery;  Laterality: N/A;  . PEG PLACEMENT N/A 03/03/2019   Procedure: PERCUTANEOUS ENDOSCOPIC GASTROSTOMY (PEG) PLACEMENT;  Surgeon: Virgel Manifold, MD;  Location: [facility] ENDOSCOPY;  Service: Endoscopy;  Laterality: N/A;  . PERIPHERAL VASCULAR CATHETERIZATION Right 12/06/2015   Procedure: Dialysis/Perma Catheter Insertion;  Surgeon: Katha Cabal, MD;  Location: [place] [facility];  Service: Cardiovascular;  Laterality: Right;  . PERIPHERAL VASCULAR THROMBECTOMY Right 09/28/2016   Procedure: Peripheral Vascular Thrombectomy;  Surgeon: Katha Cabal, MD;  Location: [place] [facility];  Service: Cardiovascular;  Laterality: Right;  . PERIPHERAL VASCULAR THROMBECTOMY Right 05/20/2018   Procedure: PERIPHERAL VASCULAR THROMBECTOMY;  Surgeon: Katha Cabal, MD;  Location: [place] [facility];  Service: Cardiovascular;  Laterality: Right;  . PORTA CATH REMOVAL N/A 06/01/2016   Procedure: Glori [name] Cath Removal;  Surgeon: Katha Cabal, MD;  Location: [place] [facility];  Service: Cardiovascular;  Laterality: N/A;  . THORACENTESIS Left 2015    Prior to Admission medications   Medication Sig Start Date End Date Taking? Authorizing Provider  atorvastatin (LIPITOR) 40 MG tablet Take 1 tablet (40 mg total) by mouth daily. 08/28/19  Yes [name], MD  ELIQUIS 5 MG TABS tablet Take 1 tablet (5 mg total) by mouth 2 (two) times daily. 09/14/19  Yes [name], MD  FLUoxetine (PROZAC) 20 MG capsule Take 1 capsule (20 mg total) by mouth daily. 06/30/19  Yes [name], MD  levothyroxine (SYNTHROID) 88 MCG tablet Take 1 tablet (88 mcg total) by mouth daily before breakfast. 08/28/19  Yes [name], MD    lidocaine-prilocaine (EMLA) cream Apply 1 application topically as needed.   Yes [provider]  Melatonin 3 MG TABS Take 3 mg by mouth at bedtime as needed (sleep).    Yes [provider]  multivitamin (RENA-VIT) TABS tablet Take 1 tablet by mouth at bedtime. 09/09/18  Yes Hosie Poisson, MD  nitroGLYCERIN (NITROSTAT) 0.4 MG SL tablet DISSOLVE ONE TABLET UNDER THE TONGUE EVERY 5 MINUTES AS NEEDED FOR CHEST PAIN.  DO NOT EXCEED A TOTAL OF 3 DOSES IN 15 MINUTES Patient taking differently: Place 0.4 mg under the tongue every 5 (five) minutes as needed for chest pain. DISSOLVE ONE TABLET UNDER THE TONGUE EVERY 5 MINUTES AS NEEDED FOR CHEST PAIN.  DO NOT EXCEED A TOTAL OF 3 DOSES IN 15 MINUTES 08/18/18  Yes Wellington Hampshire, MD  omeprazole (PRILOSEC) 20 MG capsule Take 20 mg by mouth daily.   Yes [provider]  ondansetron (ZOFRAN-ODT) 4 MG disintegrating tablet Take 1 tablet (4 mg total) by mouth every 8 (eight) hours as needed for nausea or vomiting. Patient needs appointment with PCP to receive further refills. 08/07/19  Yes [name], MD  PACERONE 200 MG tablet TAKE 1 TABLET BY MOUTH ONCE  DAILY. PATIENT NEEDS APPOINTMENT WITH PCP FOR FURTHER REFILLS Patient taking differently: Take 200 mg by mouth daily.  09/21/19  Yes [name], MD  traZODone (DESYREL) 50 MG tablet TAKE 1 TABLET BY MOUTH AT BEDTIME. PATIENT NEEDS APPOINTMENT WITH PCP FOR FURTHER REFILLS Patient taking differently: Take 50 mg by mouth at bedtime.  09/14/19  Yes [name], MD  vitamin C (VITAMIN C) 500 MG tablet Take 1 tablet (500 mg total) by mouth daily. 09/10/18  Yes Hosie Poisson, MD  acetaminophen (TYLENOL) 325 MG tablet Take 650 mg by mouth daily as needed for moderate pain or headache.     [provider]  fluticasone (FLONASE) 50 MCG/ACT nasal spray Place 2 sprays into both nostrils daily. Patient taking differently: Place 2 sprays into both nostrils daily as needed for  allergies or rhinitis.  09/17/17   [name], MD  folic acid (FOLVITE) 1 MG tablet Take 1 tablet (1 mg total) by mouth daily. 04/21/19   Sidney Ace, MD  gabapentin (NEURONTIN) 100 MG capsule Take 100 mg by mouth 3 (three) times daily.    [provider]  hydrALAZINE (APRESOLINE) 10 MG tablet Take 1 tablet (10 mg total) by mouth in the morning and at bedtime. 06/23/19   Marrianne Mood D, PA-C  hydrocortisone cream 1 % Apply topically as needed for itching. 04/20/19   Sidney Ace, MD    Allergies Nsaids, 5-alpha reductase inhibitors, and Doxycycline  Family History  Problem Relation Age of Onset  . COPD Mother   . Cancer Mother        Lung  . Pulmonary embolism Father   . Diabetes Father   . Diabetes Paternal Grandfather   . Heart disease Maternal Grandmother   . Colon cancer Neg Hx   . Colon polyps Neg Hx   . Esophageal cancer Neg Hx   . Pancreatic cancer Neg Hx   . Liver disease Neg Hx     Social History Social History   Tobacco Use  . Smoking status: Never Smoker  . Smokeless tobacco: Never Used  Vaping Use  . Vaping Use: Never used  Substance Use Topics  . Alcohol use: No    Alcohol/week: 0.0 standard drinks  . Drug use: No    Review of Systems  Review of Systems  Constitutional: Positive for fatigue. Negative for fever.  HENT: Negative for congestion and sore throat.   Eyes: Negative for visual disturbance.  Respiratory: Negative for cough and shortness of breath.   Cardiovascular: Negative for chest pain.  Gastrointestinal: Positive for abdominal pain, diarrhea and nausea. Negative for vomiting.  Genitourinary: Negative for flank pain.  Musculoskeletal: Negative for back pain and neck pain.  Skin: Negative for rash and wound.  Neurological: Negative for weakness.  All other systems reviewed and are negative.    ____________________________________________  PHYSICAL EXAM:      VITAL SIGNS: ED Triage Vitals  Enc Vitals  Group     BP 10/08/19 1102 (!) 191/81     Pulse Rate 10/08/19 1102 82     Resp 10/08/19 1102 16     Temp --      Temp src --      SpO2 10/08/19 1102 95 %     Weight 10/08/19 1059 171 lb 1.2 oz (77.6 kg)     Height 10/08/19 1059 5\' 4"  (1.626 m)     Head Circumference --      Peak Flow --  Pain Score 10/08/19 1059 10     Pain Loc --      Pain Edu? --      Excl. in [place]? --      Physical Exam Vitals and nursing note reviewed.  Constitutional:      General: She is not in acute distress.    Appearance: She is well-developed.  HENT:     Head: Normocephalic and atraumatic.  Eyes:     Conjunctiva/sclera: Conjunctivae normal.  Cardiovascular:     Rate and Rhythm: Normal rate and regular rhythm.     Heart sounds: Normal heart sounds. No murmur heard.  No friction rub.  Pulmonary:     Effort: Pulmonary effort is normal. No respiratory distress.     Breath sounds: Normal breath sounds. No wheezing or rales.  Abdominal:     General: There is no distension.     Palpations: Abdomen is soft.     Tenderness: There is abdominal tenderness in the epigastric area. There is guarding. There is no rebound.  Musculoskeletal:     Cervical back: Neck supple.  Skin:    General: Skin is warm.     Capillary Refill: Capillary refill takes less than 2 seconds.  Neurological:     Mental Status: She is alert and oriented to person, place, and time.     Motor: No abnormal muscle tone.       ____________________________________________   LABS (all labs ordered are listed, but only abnormal results are displayed)  Labs Reviewed  COMPREHENSIVE METABOLIC PANEL - Abnormal; Notable for the following components:      Result Value   Chloride 94 (*)    Glucose, Bld 192 (*)    BUN 28 (*)    Creatinine, Ser 5.46 (*)    Albumin 3.1 (*)    AST 93 (*)    Total Bilirubin 2.2 (*)    GFR calc non Af Amer 8 (*)    GFR calc Af Amer 9 (*)    Anion gap 18 (*)    All other components within normal  limits  CBC - Abnormal; Notable for the following components:   RBC 2.70 (*)    Hemoglobin 8.0 (*)    HCT 25.2 (*)    RDW 22.2 (*)    nRBC 0.6 (*)    All other components within normal limits  URINALYSIS, COMPLETE (UACMP) WITH MICROSCOPIC - Abnormal; Notable for the following components:   Color, Urine YELLOW (*)    APPearance TURBID (*)    Hgb urine dipstick SMALL (*)    Ketones, ur 5 (*)    Protein, ur >=300 (*)    Leukocytes,Ua MODERATE (*)    WBC, UA >50 (*)    Bacteria, UA MANY (*)    All other components within normal limits  TROPONIN I (HIGH SENSITIVITY) - Abnormal; Notable for the following components:   Troponin I (High Sensitivity) 37 (*)    All other components within normal limits  TROPONIN I (HIGH SENSITIVITY) - Abnormal; Notable for the following components:   Troponin I (High Sensitivity) 37 (*)    All other components within normal limits  SARS CORONAVIRUS 2 BY RT PCR (HOSPITAL ORDER, [place])  C DIFFICILE QUICK SCREEN W PCR REFLEX  GASTROINTESTINAL PANEL BY PCR, STOOL (REPLACES STOOL CULTURE)  LIPASE, BLOOD  BLOOD GAS, VENOUS    ____________________________________________  EKG: Normal sinus rhythm, VR 80. PR 204, QRS 84, QTc 445. No acute St elevation or  depressions. No ischemia or infarct. ________________________________________  RADIOLOGY All imaging, including plain films, CT scans, and ultrasounds, independently reviewed by me, and interpretations confirmed via formal radiology reads.  ED MD interpretation:   None  Official radiology report(s): CT ABDOMEN PELVIS W CONTRAST  Result Date: 10/08/2019 CLINICAL DATA:  Acute abdominal pain. EXAM: CT ABDOMEN AND PELVIS WITH CONTRAST TECHNIQUE: Multidetector CT imaging of the abdomen and pelvis was performed using the standard protocol following bolus administration of intravenous contrast. CONTRAST:  154mL OMNIPAQUE IOHEXOL 300 MG/ML  SOLN COMPARISON:  June 29, 2019.  FINDINGS: Lower chest: Small right pleural effusion is noted with adjacent subsegmental atelectasis. Left basilar atelectasis is noted as well. Hepatobiliary: No focal liver abnormality is seen. Status post cholecystectomy. No biliary dilatation. Pancreas: Unremarkable. No pancreatic ductal dilatation or surrounding inflammatory changes. Spleen: Normal in size without focal abnormality. Adrenals/Urinary Tract: Adrenal glands appear normal. Bilateral renal atrophy is noted. No hydronephrosis or renal obstruction is noted. No renal or ureteral calculi are noted. Mild bladder wall thickening is noted which may represent cystitis. Stomach/Bowel: Stomach is within normal limits. Appendix appears normal. No evidence of bowel wall thickening, distention, or inflammatory changes. Vascular/Lymphatic: Aortic atherosclerosis. No enlarged abdominal or pelvic lymph nodes. Reproductive: Uterus and bilateral adnexa are unremarkable. Other: No abdominal wall hernia or abnormality. No abdominopelvic ascites. Musculoskeletal: No acute or significant osseous findings. IMPRESSION: 1. Small right pleural effusion is noted with adjacent subsegmental atelectasis. Left basilar atelectasis is noted as well. 2. Bilateral renal atrophy is noted. 3. Mild bladder wall thickening is noted which may represent cystitis. 4. No other significant abnormality seen in the abdomen or pelvis. Aortic Atherosclerosis (ICD10-I70.0). Electronically Signed   By: Marijo Conception M.D.   On: 10/08/2019 13:35   DG Chest Portable 1 View  Result Date: 10/08/2019 CLINICAL DATA:  Cough and short of breath.  Dialysis patient. EXAM: PORTABLE CHEST 1 VIEW COMPARISON:  06/29/2019 FINDINGS: Cardiac enlargement. Prior CABG. Left jugular central venous catheter tip in the proximal SVC unchanged. Pulmonary vascular congestion. No edema. Left pleural thickening unchanged consistent with pleural scarring as noted on CT. Mild left lower lobe atelectasis. IMPRESSION:  Pulmonary vascular congestion without edema. No change in small left   pleural thickening. Electronically Signed   By: Franchot Gallo M.D.   On: 10/08/2019 13:52    ____________________________________________  PROCEDURES   Procedure(s) performed (including Critical Care):  Procedures  ____________________________________________  INITIAL IMPRESSION / MDM / [place] / ED COURSE  As part of my medical decision making, I reviewed the following data within the [facility] notes reviewed and incorporated, Old chart reviewed, Notes from prior ED visits, and [place] Controlled Substance Database       *Jamie Arias was evaluated in Emergency Department on 10/08/2019 for the symptoms described in the history of present illness. She was evaluated in the context of the global COVID-19 pandemic, which necessitated consideration that the patient might be at risk for infection with the SARS-CoV-2 virus that causes COVID-19. Institutional protocols and algorithms that pertain to the evaluation of patients at risk for COVID-19 are in a state of rapid change based on information released by regulatory bodies including the CDC and federal and state organizations. These policies and algorithms were followed during the patient's care in the ED.  Some ED evaluations and interventions may be delayed as a result of limited staffing during the pandemic.*     Medical Decision Making:  64 yo F here  with nausea, weakness, abdominal pain after second COVID vaccination. Pt took weak to go to HD yesterday. She is ill appearing on exam, dry MM yet rales in b/l lungs and mild hypoxia. Suspect viral illness vs adverse reaction to vaccination complicated by edema from missing HD. No fever. No leukocytosis or signs of sepsis. CT A/P neg for acute abnormality. EKG nonischemic. Trop 37, likely demand related but will trend. Discussed with Dr. Holley Raring - will check COVID, plan to dialyze for mild  hypoxia and edema. Will admit for monitoring. UA is pending - Hospitalist to follow-up.  ____________________________________________  FINAL CLINICAL IMPRESSION(S) / ED DIAGNOSES  Final diagnoses:  Generalized abdominal pain  ESRD (end stage renal disease) ([place])  Hypoxia     MEDICATIONS GIVEN DURING THIS VISIT:  Medications  sodium chloride flush (NS) 0.9 % injection 3 mL (has no administration in time range)  ondansetron (ZOFRAN) 4 MG/2ML injection (has no administration in time range)  naloxone (NARCAN) injection 0.4 mg (has no administration in time range)  acetaminophen (TYLENOL) tablet 650 mg (has no administration in time range)  atorvastatin (LIPITOR) tablet 40 mg (has no administration in time range)  hydrALAZINE (APRESOLINE) tablet 10 mg (has no administration in time range)  nitroGLYCERIN (NITROSTAT) SL tablet 0.4 mg (has no administration in time range)  amiodarone (PACERONE) tablet 200 mg (has no administration in time range)  FLUoxetine (PROZAC) capsule 20 mg (has no administration in time range)  traZODone (DESYREL) tablet 50 mg (has no administration in time range)  levothyroxine (SYNTHROID) tablet 88 mcg (has no administration in time range)  pantoprazole (PROTONIX) EC tablet 40 mg (has no administration in time range)  apixaban (ELIQUIS) tablet 5 mg (has no administration in time range)  folic acid (FOLVITE) tablet 1 mg (has no administration in time range)  melatonin tablet 5 mg (has no administration in time range)  multivitamin (RENA-VIT) tablet 1 tablet (has no administration in time range)  fluticasone (FLONASE) 50 MCG/ACT nasal spray 2 spray (has no administration in time range)  oxyCODONE (Oxy IR/ROXICODONE) immediate release tablet 5 mg (has no administration in time range)  ondansetron (ZOFRAN) tablet 4 mg (has no administration in time range)    Or  ondansetron (ZOFRAN) injection 4 mg (has no administration in time range)  Chlorhexidine Gluconate Cloth  2 % PADS 6 each (has no administration in time range)  ondansetron (ZOFRAN) injection 4 mg (4 mg Intravenous Given 10/08/19 1114)  ondansetron (ZOFRAN) injection 4 mg (4 mg Intravenous Given 10/08/19 1249)  HYDROmorphone (DILAUDID) injection 0.5 mg (0.5 mg Intravenous Given 10/08/19 1249)  iohexol (OMNIPAQUE) 300 MG/ML solution 100 mL (100 mLs Intravenous Contrast Given 10/08/19 1313)     ED Discharge Orders    None       Note:  This document was prepared using Dragon voice recognition software and may include unintentional dictation errors.   Duffy Bruce, MD 10/08/19 [phone]

## 2019-10-08 NOTE — ED Triage Notes (Signed)
C/O abdominal pain 'for days'.  C/O mid abdominal pain.  Patient is a dialysis patient, last done on Monday.  8/2.

## 2019-10-09 ENCOUNTER — Other Ambulatory Visit: Payer: Self-pay

## 2019-10-09 DIAGNOSIS — I480 Paroxysmal atrial fibrillation: Secondary | ICD-10-CM

## 2019-10-09 DIAGNOSIS — R197 Diarrhea, unspecified: Secondary | ICD-10-CM

## 2019-10-09 DIAGNOSIS — D638 Anemia in other chronic diseases classified elsewhere: Secondary | ICD-10-CM

## 2019-10-09 DIAGNOSIS — R1013 Epigastric pain: Secondary | ICD-10-CM

## 2019-10-09 DIAGNOSIS — R112 Nausea with vomiting, unspecified: Secondary | ICD-10-CM

## 2019-10-09 LAB — CBC
HCT: 24.2 % — ABNORMAL LOW (ref 36.0–46.0)
Hemoglobin: 7.9 g/dL — ABNORMAL LOW (ref 12.0–15.0)
MCH: 29.9 pg (ref 26.0–34.0)
MCHC: 32.6 g/dL (ref 30.0–36.0)
MCV: 91.7 fL (ref 80.0–100.0)
Platelets: 145 10*3/uL — ABNORMAL LOW (ref 150–400)
RBC: 2.64 MIL/uL — ABNORMAL LOW (ref 3.87–5.11)
RDW: 22.2 % — ABNORMAL HIGH (ref 11.5–15.5)
WBC: 5.7 10*3/uL (ref 4.0–10.5)
nRBC: 0.3 % — ABNORMAL HIGH (ref 0.0–0.2)

## 2019-10-09 LAB — PHOSPHORUS: Phosphorus: 2.6 mg/dL (ref 2.5–4.6)

## 2019-10-09 LAB — BASIC METABOLIC PANEL
Anion gap: 11 (ref 5–15)
BUN: 14 mg/dL (ref 8–23)
CO2: 30 mmol/L (ref 22–32)
Calcium: 8.6 mg/dL — ABNORMAL LOW (ref 8.9–10.3)
Chloride: 97 mmol/L — ABNORMAL LOW (ref 98–111)
Creatinine, Ser: 3.09 mg/dL — ABNORMAL HIGH (ref 0.44–1.00)
GFR calc Af Amer: 18 mL/min — ABNORMAL LOW (ref >60)
GFR calc non Af Amer: 15 mL/min — ABNORMAL LOW (ref >60)
Glucose, Bld: 121 mg/dL — ABNORMAL HIGH (ref 70–99)
Potassium: 3.4 mmol/L — ABNORMAL LOW (ref 3.5–5.1)
Sodium: 138 mmol/L (ref 135–145)

## 2019-10-09 LAB — HEPATITIS B SURFACE ANTIGEN: Hepatitis B Surface Ag: NONREACTIVE

## 2019-10-09 LAB — MRSA PCR SCREENING: MRSA by PCR: NEGATIVE

## 2019-10-09 MED ORDER — PENTAFLUOROPROP-TETRAFLUOROETH EX AERO
1.0000 "application " | INHALATION_SPRAY | CUTANEOUS | Status: DC | PRN
  Filled 2019-10-09: qty 30

## 2019-10-09 MED ORDER — PROMETHAZINE HCL 25 MG/ML IJ SOLN
6.2500 mg | Freq: Three times a day (TID) | INTRAMUSCULAR | Status: DC | PRN
  Administered 2019-10-09 (×2): 6.25 mg via INTRAVENOUS
  Filled 2019-10-09 (×2): qty 1

## 2019-10-09 MED ORDER — BOOST / RESOURCE BREEZE PO LIQD CUSTOM
1.0000 | Freq: Three times a day (TID) | ORAL | Status: DC
  Administered 2019-10-09 – 2019-10-12 (×4): 1 via ORAL

## 2019-10-09 MED ORDER — LIDOCAINE-PRILOCAINE 2.5-2.5 % EX CREA
1.0000 "application " | TOPICAL_CREAM | CUTANEOUS | Status: DC | PRN
  Filled 2019-10-09: qty 5

## 2019-10-09 MED ORDER — ALTEPLASE 2 MG IJ SOLR: 2.0000 mg | Freq: Once | INTRAMUSCULAR | Status: DC | PRN

## 2019-10-09 MED ORDER — ASCORBIC ACID 500 MG PO TABS
500.0000 mg | ORAL_TABLET | Freq: Two times a day (BID) | ORAL | Status: DC
  Administered 2019-10-09 – 2019-10-11 (×5): 500 mg via ORAL
  Filled 2019-10-09 (×6): qty 1

## 2019-10-09 MED ORDER — LIDOCAINE HCL (PF) 1 % IJ SOLN
5.0000 mL | INTRAMUSCULAR | Status: DC | PRN
  Filled 2019-10-09: qty 5

## 2019-10-09 MED ORDER — KETOROLAC TROMETHAMINE 30 MG/ML IJ SOLN
30.0000 mg | Freq: Once | INTRAMUSCULAR | Status: AC
  Administered 2019-10-09: 30 mg via INTRAVENOUS
  Filled 2019-10-09: qty 1

## 2019-10-09 MED ORDER — PROMETHAZINE HCL 25 MG/ML IJ SOLN
12.5000 mg | Freq: Once | INTRAMUSCULAR | Status: AC
  Administered 2019-10-09: 12.5 mg via INTRAVENOUS
  Filled 2019-10-09: qty 1

## 2019-10-09 MED ORDER — SODIUM CHLORIDE 0.9 % IV SOLN: 100.0000 mL | INTRAVENOUS | Status: DC | PRN

## 2019-10-09 MED ORDER — PANTOPRAZOLE SODIUM 40 MG IV SOLR
40.0000 mg | Freq: Two times a day (BID) | INTRAVENOUS | Status: DC
  Administered 2019-10-09 – 2019-10-15 (×13): 40 mg via INTRAVENOUS
  Filled 2019-10-09 (×13): qty 40

## 2019-10-09 MED ORDER — HEPARIN SODIUM (PORCINE) 1000 UNIT/ML DIALYSIS: 1000.0000 [IU] | INTRAMUSCULAR | Status: DC | PRN

## 2019-10-09 NOTE — Progress Notes (Signed)
[name], female   DOB: 1955/09/10, 64 y.o.   MRN: [id number] [facility] PROGRESS NOTE  MADY OUBRE ENI:[id number] DOB: 08-21-1955 DOA: 10/08/2019 PCP: [name], MD  HPI/Subjective: Patient seen early this morning.  She was complaining about abdominal pain and pointed to the epigastric region.  Can be severe in nature.  Had nausea and dry heaves.  Had diarrhea before coming in but now has no diarrhea.  Objective: Vitals:   10/09/19 1211 10/09/19 1212  BP: (!) 163/83   Pulse: (!) 135 73  Resp:    Temp: 97.7 F (36.5 C)   SpO2: (!) 77%     Intake/Output Summary (Last 24 hours) at 10/09/2019 1357 Last data filed at 10/08/2019 2030 Gross per 24 hour  Intake --  Output 1500 ml  Net -1500 ml   Filed Weights   10/08/19 1059  Weight: 77.6 kg    ROS: Review of Systems  Respiratory: Negative for shortness of breath.   Cardiovascular: Negative for chest pain.  Gastrointestinal: Positive for abdominal pain and nausea.  Musculoskeletal: Negative for joint pain.   Exam: Physical Exam HENT:     Nose: No mucosal edema.     Mouth/Throat:     Pharynx: No oropharyngeal exudate.  Eyes:     General: Lids are normal.     Conjunctiva/sclera: Conjunctivae normal.     Pupils: Pupils are equal, round, and reactive to light.  Cardiovascular:     Rate and Rhythm: Normal rate and regular rhythm.     Heart sounds: Normal heart sounds, S1 normal and S2 normal.  Pulmonary:     Breath sounds: No decreased breath sounds, wheezing, rhonchi or rales.  Abdominal:     Palpations: Abdomen is soft.     Tenderness: There is abdominal tenderness in the epigastric area.  Musculoskeletal:     Right ankle: No swelling.     Left ankle: No swelling.  Skin:    General: Skin is warm.     Findings: No rash.  Neurological:     Mental Status: She is alert and oriented to person, place, and time.       Data Reviewed: Basic Metabolic Panel: Recent Labs  Lab  10/08/19 1111 10/09/19 0431 10/09/19 0847  NA 141 138  --   K 4.4 3.4*  --   CL 94* 97*  --   CO2 29 30  --   GLUCOSE 192* 121*  --   BUN 28* 14  --   CREATININE 5.46* 3.09*  --   CALCIUM 9.5 8.6*  --   PHOS  --   --  2.6   Liver Function Tests: Recent Labs  Lab 10/08/19 1111  AST 93*  ALT 17  ALKPHOS 50  BILITOT 2.2*  PROT 7.4  ALBUMIN 3.1*   Recent Labs  Lab 10/08/19 1111  LIPASE 17   CBC: Recent Labs  Lab 10/08/19 1111 10/09/19 0431  WBC 4.8 5.7  HGB 8.0* 7.9*  HCT 25.2* 24.2*  MCV 93.3 91.7  PLT 158 145*   BNP (last 3 results) Recent Labs    03/01/19 0429  BNP 720.0*      Recent Results (from the past 240 hour(s))  SARS Coronavirus 2 by RT PCR (hospital order, performed in [facility] hospital lab) Nasopharyngeal Nasopharyngeal Swab     Status: None   Collection Time: 10/08/19  2:29 PM   Specimen: Nasopharyngeal Swab  Result Value Ref Range Status   SARS Coronavirus  2 NEGATIVE NEGATIVE Final    Comment: (NOTE) SARS-CoV-2 target nucleic acids are NOT DETECTED.  The SARS-CoV-2 RNA is generally detectable in upper and lower respiratory specimens during the acute phase of infection. The lowest concentration of SARS-CoV-2 viral copies this assay can detect is 250 copies / mL. A negative result does not preclude SARS-CoV-2 infection and should not be used as the sole basis for treatment or other patient management decisions.  A negative result may occur with improper specimen collection / handling, submission of specimen other than nasopharyngeal swab, presence of viral mutation(s) within the areas targeted by this assay, and inadequate number of viral copies (<250 copies / mL). A negative result must be combined with clinical observations, patient history, and epidemiological information.  Fact Sheet for Patients:   StrictlyIdeas.no  Fact Sheet for Healthcare Providers: BankingDealers.co.za  This  test is not yet approved or  cleared by the [place] FDA and has been authorized for detection and/or diagnosis of SARS-CoV-2 by FDA under an Emergency Use Authorization (EUA).  This EUA will remain in effect (meaning this test can be used) for the duration of the COVID-19 declaration under Section 564(b)(1) of the Act, 21 U.S.C. section 360bbb-3(b)(1), unless the authorization is terminated or revoked sooner.  Performed at [facility], [address] [place], [place] [address]   MRSA PCR Screening     Status: None   Collection Time: 10/09/19  2:46 AM   Specimen: Nasopharyngeal  Result Value Ref Range Status   MRSA by PCR NEGATIVE NEGATIVE Final    Comment:        The GeneXpert MRSA Assay (FDA approved for NASAL specimens only), is one component of a comprehensive MRSA colonization surveillance program. It is not intended to diagnose MRSA infection nor to guide or monitor treatment for MRSA infections. Performed at [facility], [address] [place], [place] [address]      Studies: CT ABDOMEN PELVIS W CONTRAST  Result Date: 10/08/2019 CLINICAL DATA:  Acute abdominal pain. EXAM: CT ABDOMEN AND PELVIS WITH CONTRAST TECHNIQUE: Multidetector CT imaging of the abdomen and pelvis was performed using the standard protocol following bolus administration of intravenous contrast. CONTRAST:  146mL OMNIPAQUE IOHEXOL 300 MG/ML  SOLN COMPARISON:  June 29, 2019. FINDINGS: Lower chest: Small right pleural effusion is noted with adjacent subsegmental atelectasis. Left basilar atelectasis is noted as well. Hepatobiliary: No focal liver abnormality is seen. Status post cholecystectomy. No biliary dilatation. Pancreas: Unremarkable. No pancreatic ductal dilatation or surrounding inflammatory changes. Spleen: Normal in size without focal abnormality. Adrenals/Urinary Tract: Adrenal glands appear normal. Bilateral renal atrophy is noted. No hydronephrosis or renal obstruction  is noted. No renal or ureteral calculi are noted. Mild bladder wall thickening is noted which may represent cystitis. Stomach/Bowel: Stomach is within normal limits. Appendix appears normal. No evidence of bowel wall thickening, distention, or inflammatory changes. Vascular/Lymphatic: Aortic atherosclerosis. No enlarged abdominal or pelvic lymph nodes. Reproductive: Uterus and bilateral adnexa are unremarkable. Other: No abdominal wall hernia or abnormality. No abdominopelvic ascites. Musculoskeletal: No acute or significant osseous findings. IMPRESSION: 1. Small right pleural effusion is noted with adjacent subsegmental atelectasis. Left basilar atelectasis is noted as well. 2. Bilateral renal atrophy is noted. 3. Mild bladder wall thickening is noted which may represent cystitis. 4. No other significant abnormality seen in the abdomen or pelvis. Aortic Atherosclerosis (ICD10-I70.0). Electronically Signed   By: Marijo Conception M.D.   On: 10/08/2019 13:35   DG Chest Portable 1 View  Result Date: 10/08/2019 CLINICAL DATA:  Cough and short of breath.  Dialysis patient. EXAM: PORTABLE CHEST 1 VIEW COMPARISON:  06/29/2019 FINDINGS: Cardiac enlargement. Prior CABG. Left jugular central venous catheter tip in the proximal SVC unchanged. Pulmonary vascular congestion. No edema. Left pleural thickening unchanged consistent with pleural scarring as noted on CT. Mild left lower lobe atelectasis. IMPRESSION: Pulmonary vascular congestion without edema. No change in small left   pleural thickening. Electronically Signed   By: Franchot Gallo M.D.   On: 10/08/2019 13:52    Scheduled Meds: . amiodarone  200 mg Oral Daily  . vitamin C  500 mg Oral BID  . atorvastatin  40 mg Oral Daily  . Chlorhexidine Gluconate Cloth  6 each Topical Q0600  . feeding supplement  1 Container Oral TID BM  . FLUoxetine  20 mg Oral Daily  . folic acid  1 mg Oral Daily  . hydrALAZINE  10 mg Oral BID  . levothyroxine  88 mcg Oral Q0600  .  multivitamin  1 tablet Oral QHS  . pantoprazole (PROTONIX) IV  40 mg Intravenous Q12H  . sodium chloride flush  3 mL Intravenous Once  . traZODone  50 mg Oral QHS   Continuous Infusions: . sodium chloride    . sodium chloride      Assessment/Plan:  1. Nausea, vomiting, diarrhea and epigastric abdominal pain.  I ordered stool studies but patient has not had a bowel movement yet.  Change Protonix to IV.  Consulted GI for endoscopy for tomorrow.  CT scan of the abdomen and pelvis did not show an etiology of where the pain would be coming from.  As needed nausea and pain medications. 2. Essential hypertension.  Continue hydralazine. 3. End-stage renal disease on dialysis 4. Paroxysmal atrial fibrillation history on amiodarone.  Hold Eliquis. 5. Anemia of chronic disease.  Hold Eliquis.  Monitor hemoglobin 6. Pulse ox of 77% on last vital sign.  Called nursing staff to check if this is a real value because I was not notified with that number. 7. Hyperlipidemia unspecified on atorvastatin      Code Status:     Code Status Orders  (From admission, onward)         Start     Ordered   10/08/19 1508  Full code  Continuous        10/08/19 1508        Code Status History    Date Active Date Inactive Code Status Order ID Comments User Context   09/09/2019 0359 09/10/2019 2011 Full Code [id number]  Athena Masse, MD ED   06/29/2019 2028 07/01/2019 0108 Full Code [id number]  Ivor Costa, MD Inpatient   02/21/2019 0932 04/21/2019 0155 DNR [id number]  Sidney Ace, MD Inpatient   02/09/2019 1755 02/21/2019 0932 Full Code [id number]  Thornell Mule, MD ED   08/24/2018 0022 09/15/2018 1720 Full Code [id number]  Shela Leff, MD Inpatient   09/04/2017 1510 09/06/2017 1920 Full Code [id number]  Epifanio Lesches, MD ED   02/15/2017 1243 02/16/2017 2023 Full Code [id number]  Saundra Shelling, MD Inpatient   10/23/2015 0828 10/23/2015 0908 Full Code [id number]  Saundra Shelling, MD Inpatient   09/03/2015  0130 09/03/2015 1619 Full Code [id number]  Lance Coon, MD Inpatient   07/02/2013 1327 07/14/2013 1540 Full Code [id number]  Nani Skillern, PA-C Inpatient   06/29/2013 2153 07/02/2013 1327 Full Code [id number]  Larey Dresser, MD Inpatient   Advance Care Planning Activity     Family  Communication: Left message for husband Disposition Plan: Status is: Inpatient  Dispo: The patient is from: Home              Anticipated d/c is to: Home              Anticipated d/c date is: Likely will need a couple days              Patient currently still with severe abdominal pain and nausea.  Consultants:  Nephrology  Gastroenterology  Time spent: 28 minutes  [place]

## 2019-10-09 NOTE — Plan of Care (Signed)
Continuing with plan of care. 

## 2019-10-09 NOTE — Consult Note (Signed)
[name], MD [facility]  [address] [place], [place] [address] Phone: [phone] Fax : [phone]  Consultation  Referring Provider:     Dr. Leslye Peer Primary Care Physician:  [name], MD Primary Gastroenterologist:  Dr. Bonna Gains         Reason for Consultation:     Nausea vomiting diarrhea  Date of Admission:  10/08/2019 Date of Consultation:  10/09/2019         HPI:   BRYAUNA BYRUM is a 64 y.o. female who was admitted with abdominal pain.  The patient was reporting severe epigastric pain that the husband states was a few days ago.  The patient is quite sleepy today and unable to give much information.  The patient has also had diarrhea associated with his nausea and vomiting.  The patient has had a PEG tube in the past.  The patient is presently on hemodialysis and missed her dialysis prior to admission because of not feeling well.  The patient had a CT scan of the abdomen that showed a small right pleural effusion with some atelectasis on the left and right with bilateral renal atrophy and bladder wall thickening there is no other findings to explain her abdominal pain.  The patient was found to have an elevated bilirubin at 2.2 and an AST that was elevated at 93 yesterday.  The patient had C. difficile and a GI panel sent off without any stool samples being collected. The patient's diarrhea was reported to be 5-6 times a day although she is not having diarrhea here.  The patient was given Dilaudid for her abdominal pain and became very lethargic.  Past Medical History:  Diagnosis Date  . Anemia   . Anginal pain ([place])   . Arthritis    upper back and neck   . Chronic diastolic CHF (congestive heart failure) ([place])    a. Due to ischemic cardiomyopathy. EF as low as 35%, improved to normal s/p CABG; b. echo 07/06/13: EF 55-60%, no RWMAs, mod TR, trivial pericardial effusion not c/w tamponade physiology;  c. 10/2015 Echo: EF 65%, Gr1 DD, triv AI, mild MR, mildly dil LA,  mod TR, PASP 36mmHg.  Marland Kitchen Coronary artery disease    a. NSTEMI 06/2013; b.cath: severe three-vessel CAD w/ EF 30% & mild-mod MR; c. s/p 3 vessel CABG 07/02/13 (LIMA-LAD, SVG-OM, and SVG-RPDA);  d. 10/2015 MV: no ischemia/infarct.  . Diabetes mellitus without complication ([place])   . Diabetic neuropathy ([place])   . Dialysis patient [facility])    MWF  . ESRD (end stage renal disease) ([place])    a. 12/2015 initiated - mwf dialysis.  Marland Kitchen GERD (gastroesophageal reflux disease)   . Hyperlipidemia   . Hypertension   . Hypothyroidism   . Myocardial infarction ([place]) 2014  . Neuropathy   . Pleural effusion 2015  . Pulmonary hypertension ([place])   . Renal insufficiency   . Wears dentures    full lower    Past Surgical History:  Procedure Laterality Date  . A/V FISTULAGRAM Right 12/17/2016   Procedure: A/V Fistulagram;  Surgeon: Algernon Huxley, MD;  Location: [place] [facility];  Service: Cardiovascular;  Laterality: Right;  . A/V FISTULAGRAM Right 01/07/2017   Procedure: A/V Fistulagram;  Surgeon: Algernon Huxley, MD;  Location: [place] [facility];  Service: Cardiovascular;  Laterality: Right;  . A/V FISTULAGRAM Right 12/03/2017   Procedure: A/V FISTULAGRAM;  Surgeon: Katha Cabal, MD;  Location: [place] [facility];  Service:  Cardiovascular;  Laterality: Right;  . A/V FISTULAGRAM Right 02/23/2019   Procedure: A/V Fistulagram;  Surgeon: Algernon Huxley, MD;  Location: [place] [facility];  Service: Cardiovascular;  Laterality: Right;  . A/V SHUNT INTERVENTION N/A 02/22/2017   Procedure: A/V SHUNT INTERVENTION;  Surgeon: Algernon Huxley, MD;  Location: [place] [facility];  Service: Cardiovascular;  Laterality: N/A;  . AV FISTULA PLACEMENT Right 02/03/2016   Procedure: INSERTION OF ARTERIOVENOUS (AV) GORE-TEX GRAFT ARM ( BRACH / AXILLARY );  Surgeon: Katha Cabal, MD;  Location: [facility];  Service: Vascular;  Laterality: Right;  . AV FISTULA PLACEMENT Left 07/09/2019   Procedure: INSERTION OF ARTERIOVENOUS  (AV) GORE-TEX GRAFT ARM ( BRACHIAL AXILLARY );  Surgeon: Algernon Huxley, MD;  Location: [facility];  Service: Vascular;  Laterality: Left;  . [place] REMOVAL Right 03/04/2019   Procedure: REMOVAL OF ARTERIOVENOUS GORETEX GRAFT ([place]);  Surgeon: Algernon Huxley, MD;  Location: [facility];  Service: Vascular;  Laterality: Right;  . CARDIAC CATHETERIZATION    . CATARACT EXTRACTION Bilateral   . CHOLECYSTECTOMY N/A 12/09/2014   Procedure: LAPAROSCOPIC CHOLECYSTECTOMY;  Surgeon: Marlyce [name], MD;  Location: [facility];  Service: General;  Laterality: N/A;  . CORONARY ARTERY BYPASS GRAFT N/A 07/02/2013   Procedure: CORONARY ARTERY BYPASS GRAFTING (CABG);  Surgeon: Ivin [name], MD;  Location: [place];  Service: Open Heart Surgery;  Laterality: N/A;  CABG x three, using left internal mammary artery and right leg greater saphenous vein harvested endoscopically  . ESOPHAGOGASTRODUODENOSCOPY N/A 04/11/2019   Procedure: ESOPHAGOGASTRODUODENOSCOPY (EGD);  Surgeon: Jonathon Bellows, MD;  Location[facility] ENDOSCOPY;  Service: Gastroenterology;  Laterality: N/A;  . ESOPHAGOGASTRODUODENOSCOPY (EGD) WITH PROPOFOL N/A 11/24/2015   Procedure: ESOPHAGOGASTRODUODENOSCOPY (EGD) WITH PROPOFOL;  Surgeon: [name], MD;  Location: [place];  Service: Endoscopy;  Laterality: N/A;  Diabetic - insulin  . EYE SURGERY Bilateral    Cataract Extraction with IOL  . INTRAOPERATIVE TRANSESOPHAGEAL ECHOCARDIOGRAM N/A 07/02/2013   Procedure: INTRAOPERATIVE TRANSESOPHAGEAL ECHOCARDIOGRAM;  Surgeon: Ivin [name], MD;  Location: [place];  Service: Open Heart Surgery;  Laterality: N/A;  . PEG PLACEMENT N/A 03/03/2019   Procedure: PERCUTANEOUS ENDOSCOPIC GASTROSTOMY (PEG) PLACEMENT;  Surgeon: Virgel Manifold, MD;  Location: [facility] ENDOSCOPY;  Service: Endoscopy;  Laterality: N/A;  . PERIPHERAL VASCULAR CATHETERIZATION Right 12/06/2015   Procedure: Dialysis/Perma Catheter Insertion;  Surgeon: Katha Cabal, MD;  Location: [place]  CV [facility];  Service: Cardiovascular;  Laterality: Right;  . PERIPHERAL VASCULAR THROMBECTOMY Right 09/28/2016   Procedure: Peripheral Vascular Thrombectomy;  Surgeon: Katha Cabal, MD;  Location: [place] [facility];  Service: Cardiovascular;  Laterality: Right;  . PERIPHERAL VASCULAR THROMBECTOMY Right 05/20/2018   Procedure: PERIPHERAL VASCULAR THROMBECTOMY;  Surgeon: Katha Cabal, MD;  Location: [place] [facility];  Service: Cardiovascular;  Laterality: Right;  . PORTA CATH REMOVAL N/A 06/01/2016   Procedure: [name] Cath Removal;  Surgeon: Katha Cabal, MD;  Location: [place] [facility];  Service: Cardiovascular;  Laterality: N/A;  . THORACENTESIS Left 2015    Prior to Admission medications   Medication Sig Start Date End Date Taking? Authorizing Provider  atorvastatin (LIPITOR) 40 MG tablet Take 1 tablet (40 mg total) by mouth daily. 08/28/19  Yes [name], MD  ELIQUIS 5 MG TABS tablet Take 1 tablet (5 mg total) by mouth 2 (two) times daily. 09/14/19  Yes [name], MD  FLUoxetine (PROZAC) 20 MG capsule Take 1 capsule (20 mg total) by  mouth daily. 06/30/19  Yes [name], MD  levothyroxine (SYNTHROID) 88 MCG tablet Take 1 tablet (88 mcg total) by mouth daily before breakfast. 08/28/19  Yes [name], MD  lidocaine-prilocaine (EMLA) cream Apply 1 application topically as needed.   Yes [provider]  Melatonin 3 MG TABS Take 3 mg by mouth at bedtime as needed (sleep).    Yes [provider]  multivitamin (RENA-VIT) TABS tablet Take 1 tablet by mouth at bedtime. 09/09/18  Yes Hosie Poisson, MD  nitroGLYCERIN (NITROSTAT) 0.4 MG SL tablet DISSOLVE ONE TABLET UNDER THE TONGUE EVERY 5 MINUTES AS NEEDED FOR CHEST PAIN.  DO NOT EXCEED A TOTAL OF 3 DOSES IN 15 MINUTES Patient taking differently: Place 0.4 mg under the tongue every 5 (five) minutes as needed for chest pain. DISSOLVE ONE TABLET UNDER THE TONGUE EVERY 5 MINUTES AS NEEDED  FOR CHEST PAIN.  DO NOT EXCEED A TOTAL OF 3 DOSES IN 15 MINUTES 08/18/18  Yes Wellington Hampshire, MD  omeprazole (PRILOSEC) 20 MG capsule Take 20 mg by mouth daily.   Yes [provider]  ondansetron (ZOFRAN-ODT) 4 MG disintegrating tablet Take 1 tablet (4 mg total) by mouth every 8 (eight) hours as needed for nausea or vomiting. Patient needs appointment with PCP to receive further refills. 08/07/19  Yes [name], MD  PACERONE 200 MG tablet TAKE 1 TABLET BY MOUTH ONCE DAILY. PATIENT NEEDS APPOINTMENT WITH PCP FOR FURTHER REFILLS Patient taking differently: Take 200 mg by mouth daily.  09/21/19  Yes [name], MD  traZODone (DESYREL) 50 MG tablet TAKE 1 TABLET BY MOUTH AT BEDTIME. PATIENT NEEDS APPOINTMENT WITH PCP FOR FURTHER REFILLS Patient taking differently: Take 50 mg by mouth at bedtime.  09/14/19  Yes [name], MD  vitamin C (VITAMIN C) 500 MG tablet Take 1 tablet (500 mg total) by mouth daily. 09/10/18  Yes Hosie Poisson, MD  acetaminophen (TYLENOL) 325 MG tablet Take 650 mg by mouth daily as needed for moderate pain or headache.     [provider]  fluticasone (FLONASE) 50 MCG/ACT nasal spray Place 2 sprays into both nostrils daily. Patient taking differently: Place 2 sprays into both nostrils daily as needed for allergies or rhinitis.  09/17/17   [name], MD  folic acid (FOLVITE) 1 MG tablet Take 1 tablet (1 mg total) by mouth daily. 04/21/19   Sidney Ace, MD  gabapentin (NEURONTIN) 100 MG capsule Take 100 mg by mouth 3 (three) times daily.    [provider]  hydrALAZINE (APRESOLINE) 10 MG tablet Take 1 tablet (10 mg total) by mouth in the morning and at bedtime. 06/23/19   Marrianne Mood D, PA-C  hydrocortisone cream 1 % Apply topically as needed for itching. 04/20/19   Sidney Ace, MD    Family History  Problem Relation Age of Onset  . COPD Mother   . Cancer Mother        Lung  . Pulmonary embolism Father     . Diabetes Father   . Diabetes Paternal Grandfather   . Heart disease Maternal Grandmother   . Colon cancer Neg Hx   . Colon polyps Neg Hx   . Esophageal cancer Neg Hx   . Pancreatic cancer Neg Hx   . Liver disease Neg Hx      Social History   Tobacco Use  . Smoking status: Never Smoker  . Smokeless tobacco: Never Used  Vaping Use  .  Vaping Use: Never used  Substance Use Topics  . Alcohol use: No    Alcohol/week: 0.0 standard drinks  . Drug use: No    Allergies as of 10/08/2019 - Review Complete 10/08/2019  Allergen Reaction Noted  . Nsaids Other (See Comments) 04/03/2016  . 5-alpha reductase inhibitors  07/09/2019  . Doxycycline Other (See Comments) 05/14/2017    Review of Systems:    All systems reviewed and negative except where noted in HPI.   Physical Exam:  Vital signs in last 24 hours: Temp:  [97.6 F (36.4 C)-98.4 F (36.9 C)] 97.7 F (36.5 C) (08/06 1211) Pulse Rate:  [53-135] 73 (08/06 1212) Resp:  [10-67] 16 (08/06 0449) BP: (133-181)/(63-88) 163/83 (08/06 1211) SpO2:  [77 %-100 %] 77 % (08/06 1211) Last BM Date: 10/08/19 General:   Pleasant, cooperative in NAD Head:  Normocephalic and atraumatic. Eyes:   No icterus.   Conjunctiva pink. PERRLA. Ears:  Normal auditory acuity. Neck:  Supple; no masses or thyroidomegaly Lungs: Respirations even and unlabored. Lungs clear to auscultation bilaterally.   No wheezes, crackles, or rhonchi.  Heart:  Regular rate and rhythm;  Without murmur, clicks, rubs or gallops Abdomen:  Soft, nondistended, positive epigastric tenderness. Normal bowel sounds. No appreciable masses or hepatomegaly.  No rebound or guarding.  Rectal:  Not performed. Msk:  Symmetrical without gross deformities.    Extremities:  Without edema, cyanosis or clubbing. Neurologic: Somnolent and oriented x3;  grossly normal neurologically. Skin:  Intact without significant lesions or rashes. Cervical Nodes:  No significant cervical  adenopathy. Psych: Somnolent and cooperative. Normal affect.  LAB RESULTS: Recent Labs    10/08/19 1111 10/09/19 0431  WBC 4.8 5.7  HGB 8.0* 7.9*  HCT 25.2* 24.2*  PLT 158 145*   BMET Recent Labs    10/08/19 1111 10/09/19 0431  NA 141 138  K 4.4 3.4*  CL 94* 97*  CO2 29 30  GLUCOSE 192* 121*  BUN 28* 14  CREATININE 5.46* 3.09*  CALCIUM 9.5 8.6*   LFT Recent Labs    10/08/19 1111  PROT 7.4  ALBUMIN 3.1*  AST 93*  ALT 17  ALKPHOS 50  BILITOT 2.2*   PT/INR No results for input(s): LABPROT, INR in the last 72 hours.  STUDIES: CT ABDOMEN PELVIS W CONTRAST  Result Date: 10/08/2019 CLINICAL DATA:  Acute abdominal pain. EXAM: CT ABDOMEN AND PELVIS WITH CONTRAST TECHNIQUE: Multidetector CT imaging of the abdomen and pelvis was performed using the standard protocol following bolus administration of intravenous contrast. CONTRAST:  172mL OMNIPAQUE IOHEXOL 300 MG/ML  SOLN COMPARISON:  June 29, 2019. FINDINGS: Lower chest: Small right pleural effusion is noted with adjacent subsegmental atelectasis. Left basilar atelectasis is noted as well. Hepatobiliary: No focal liver abnormality is seen. Status post cholecystectomy. No biliary dilatation. Pancreas: Unremarkable. No pancreatic ductal dilatation or surrounding inflammatory changes. Spleen: Normal in size without focal abnormality. Adrenals/Urinary Tract: Adrenal glands appear normal. Bilateral renal atrophy is noted. No hydronephrosis or renal obstruction is noted. No renal or ureteral calculi are noted. Mild bladder wall thickening is noted which may represent cystitis. Stomach/Bowel: Stomach is within normal limits. Appendix appears normal. No evidence of bowel wall thickening, distention, or inflammatory changes. Vascular/Lymphatic: Aortic atherosclerosis. No enlarged abdominal or pelvic lymph nodes. Reproductive: Uterus and bilateral adnexa are unremarkable. Other: No abdominal wall hernia or abnormality. No abdominopelvic  ascites. Musculoskeletal: No acute or significant osseous findings. IMPRESSION: 1. Small right pleural effusion is noted with adjacent subsegmental atelectasis. Left basilar  atelectasis is noted as well. 2. Bilateral renal atrophy is noted. 3. Mild bladder wall thickening is noted which may represent cystitis. 4. No other significant abnormality seen in the abdomen or pelvis. Aortic Atherosclerosis (ICD10-I70.0). Electronically Signed   By: Marijo Conception M.D.   On: 10/08/2019 13:35   DG Chest Portable 1 View  Result Date: 10/08/2019 CLINICAL DATA:  Cough and short of breath.  Dialysis patient. EXAM: PORTABLE CHEST 1 VIEW COMPARISON:  06/29/2019 FINDINGS: Cardiac enlargement. Prior CABG. Left jugular central venous catheter tip in the proximal SVC unchanged. Pulmonary vascular congestion. No edema. Left pleural thickening unchanged consistent with pleural scarring as noted on CT. Mild left lower lobe atelectasis. IMPRESSION: Pulmonary vascular congestion without edema. No change in small left   pleural thickening. Electronically Signed   By: Franchot Gallo M.D.   On: 10/08/2019 13:52      Impression / Plan:   Assessment: Active Problems:   Nausea vomiting and diarrhea   Jamie Arias is a 64 y.o. y/o female with a report of nausea vomiting diarrhea without any continued diarrhea and a stool sample has been ordered for C. difficile and a GI panel.  There has been no sample sent off due to her cessation of her diarrhea.  The patient had a CT scan that did not show any cause for abdominal pain.  Plan:  The patient will be set up for an EGD for tomorrow.  The patient will have her stool sent off for possible pathogens as the cause of her diarrhea.  The patient and her husband have been explained the plan and agree with it.  Thank you for involving me in the care of this patient.      LOS: 1 day   [name], MD  10/09/2019, 1:04 PM Pager [phone] 7am-5pm  Check AMION for 5pm -7am coverage  and on weekends   Note: This dictation was prepared with Dragon dictation along with smaller phrase technology. Any transcriptional errors that result from this process are unintentional.

## 2019-10-09 NOTE — Progress Notes (Signed)
This note also relates to the following rows which could not be included: Pulse Rate - Cannot attach notes to unvalidated device data Resp - Cannot attach notes to unvalidated device data SpO2 - Cannot attach notes to unvalidated device data  Hd completed

## 2019-10-09 NOTE — Progress Notes (Signed)
This note also relates to the following rows which could not be included: Pulse Rate - Cannot attach notes to unvalidated device data Resp - Cannot attach notes to unvalidated device data BP - Cannot attach notes to unvalidated device data  Hd completed  

## 2019-10-09 NOTE — Progress Notes (Signed)
Initial Nutrition Assessment  DOCUMENTATION CODES:   Not applicable  INTERVENTION:   Boost Breeze po TID, each supplement provides 250 kcal and 9 grams of protein  Vanilla and butter pecan Nepro with diet advancement   Rena-vite daily   Vitamin C '500mg'$  po BID  NUTRITION DIAGNOSIS:   Increased nutrient needs related to chronic illness (ESRD on HD) as evidenced by increased estimated needs.  GOAL:   Patient will meet greater than or equal to 90% of their needs  MONITOR:   Supplement acceptance, PO intake, Labs, Weight trends, Skin, I & O's  REASON FOR ASSESSMENT:   Malnutrition Screening Tool    ASSESSMENT:   64 y.o. female with medical history significant of hypertension, hyperlipidemia, diabetes mellitus, GERD, hypothyroidism, depression, CAD, CABG, ESRD-HD (MWF), CHF, iron deficiency anemia, atrial fibrillation and left arm DVT on Eliquis, s/p PEG 03/02/20 (now removed) who presents with abdominal pain, nausea, vomiting and diarrhea.   Met with pt in room today. Pt is well known to this RD from multiple previous admits. Pt lethargic today and sleeping at time of RD visit. Pt unable to provide much history but answers only yes and no questions. Pt with poor appetite and oral intake at baseline. Pt previously with PEG tube which has now been removed. Per chart, pt is down 23lbs(12%) over the past 6 months with only 7lb(4%) weight loss since April. RD suspects that a good portion of pt's weight loss was related to fluid changes. Pt currently on a clear liquid diet. Pt's breakfast tray was sitting on her side table untouched. Pt does enjoy vanilla and butter pecan Nepro; RD will add this once pateint's diet is advanced. RD will also add vitamin C as pt with h/o deficiency and with widespread ecchymosis.   Medications reviewed and include: folic acid, synthroid, rena-vite, protonix  Labs reviewed: K 3.4(L), creat 3.09(H), P 2.6 wnl Hgb 7.9(L), Hct 24.2(L) AIC 6.1(H)-  7/7  NUTRITION - FOCUSED PHYSICAL EXAM:    Most Recent Value  Orbital Region No depletion  Upper Arm Region No depletion  Thoracic and Lumbar Region No depletion  Buccal Region No depletion  Temple Region No depletion  Clavicle Bone Region No depletion  Clavicle and Acromion Bone Region No depletion  Scapular Bone Region No depletion  Dorsal Hand No depletion  Patellar Region Moderate depletion  Anterior Thigh Region Mild depletion  Posterior Calf Region Mild depletion  Edema (RD Assessment) None  Hair Reviewed  Eyes Reviewed  Mouth Reviewed  Skin Reviewed  Nails Reviewed     Diet Order:   Diet Order            Diet clear liquid Room service appropriate? Yes; Fluid consistency: Thin  Diet effective now                EDUCATION NEEDS:   Education needs have been addressed  Skin:  Skin Assessment: Reviewed RN Assessment (ecchymosis)  Last BM:  8/6- type 7  Height:   Ht Readings from Last 1 Encounters:  10/08/19 '5\' 4"'$  (1.626 m)    Weight:   Wt Readings from Last 1 Encounters:  10/08/19 77.6 kg    Ideal Body Weight:  54.5 kg  BMI:  Body mass index is 29.37 kg/m.  Estimated Nutritional Needs:   Kcal:  1700-2000kcal/day  Protein:  85-100g/day  Fluid:  UOP +1L  Koleen Distance MS, RD, LDN Please refer to [facility] for RD and/or RD on-call/weekend/after hours pager

## 2019-10-10 ENCOUNTER — Encounter: Payer: Self-pay | Admitting: Internal Medicine

## 2019-10-10 ENCOUNTER — Inpatient Hospital Stay: Payer: Medicare Other | Admitting: Anesthesiology

## 2019-10-10 ENCOUNTER — Other Ambulatory Visit: Payer: Self-pay

## 2019-10-10 ENCOUNTER — Encounter
Admission: EM | Disposition: A | Discharge status: Home / Self Care | Payer: Self-pay | Source: Home / Self Care | Attending: Internal Medicine

## 2019-10-10 DIAGNOSIS — K2940 Chronic atrophic gastritis without bleeding: Secondary | ICD-10-CM

## 2019-10-10 DIAGNOSIS — R110 Nausea: Secondary | ICD-10-CM

## 2019-10-10 DIAGNOSIS — R1013 Epigastric pain: Secondary | ICD-10-CM

## 2019-10-10 HISTORY — PX: ESOPHAGOGASTRODUODENOSCOPY (EGD) WITH PROPOFOL: SHX5813

## 2019-10-10 LAB — BASIC METABOLIC PANEL
Anion gap: 15 (ref 5–15)
BUN: 11 mg/dL (ref 8–23)
CO2: 26 mmol/L (ref 22–32)
Calcium: 8.7 mg/dL — ABNORMAL LOW (ref 8.9–10.3)
Chloride: 98 mmol/L (ref 98–111)
Creatinine, Ser: 2.53 mg/dL — ABNORMAL HIGH (ref 0.44–1.00)
GFR calc Af Amer: 22 mL/min — ABNORMAL LOW (ref >60)
GFR calc non Af Amer: 19 mL/min — ABNORMAL LOW (ref >60)
Glucose, Bld: 128 mg/dL — ABNORMAL HIGH (ref 70–99)
Potassium: 3.3 mmol/L — ABNORMAL LOW (ref 3.5–5.1)
Sodium: 139 mmol/L (ref 135–145)

## 2019-10-10 LAB — CBC
HCT: 24.7 % — ABNORMAL LOW (ref 36.0–46.0)
Hemoglobin: 7.7 g/dL — ABNORMAL LOW (ref 12.0–15.0)
MCH: 29.2 pg (ref 26.0–34.0)
MCHC: 31.2 g/dL (ref 30.0–36.0)
MCV: 93.6 fL (ref 80.0–100.0)
Platelets: 148 10*3/uL — ABNORMAL LOW (ref 150–400)
RBC: 2.64 MIL/uL — ABNORMAL LOW (ref 3.87–5.11)
RDW: 22.3 % — ABNORMAL HIGH (ref 11.5–15.5)
WBC: 5.7 10*3/uL (ref 4.0–10.5)
nRBC: 0.3 % — ABNORMAL HIGH (ref 0.0–0.2)

## 2019-10-10 LAB — URINE CULTURE: Culture: >=100000 — AB

## 2019-10-10 LAB — HEPATITIS B SURFACE ANTIBODY, QUANTITATIVE: Hep B S AB Quant (Post): 49.5 m[IU]/mL (ref >9.9)

## 2019-10-10 LAB — GLUCOSE, CAPILLARY: Glucose-Capillary: 118 mg/dL — ABNORMAL HIGH (ref 70–99)

## 2019-10-10 LAB — PARATHYROID HORMONE, INTACT (NO CA): PTH: 83 pg/mL — ABNORMAL HIGH (ref 15–65)

## 2019-10-10 SURGERY — ESOPHAGOGASTRODUODENOSCOPY (EGD) WITH PROPOFOL
Anesthesia: General

## 2019-10-10 MED ORDER — FLUCONAZOLE 100MG IVPB
100.0000 mg | INTRAVENOUS | Status: DC
  Administered 2019-10-10 – 2019-10-14 (×5): 100 mg via INTRAVENOUS
  Filled 2019-10-10 (×6): qty 50

## 2019-10-10 MED ORDER — SODIUM CHLORIDE 0.9 % IV SOLN: Freq: Once | INTRAVENOUS | Status: AC

## 2019-10-10 MED ORDER — FENTANYL CITRATE (PF) 100 MCG/2ML IJ SOLN
INTRAMUSCULAR | Status: AC
  Filled 2019-10-10: qty 2

## 2019-10-10 MED ORDER — FENTANYL CITRATE (PF) 100 MCG/2ML IJ SOLN
INTRAMUSCULAR | Status: DC | PRN
  Administered 2019-10-10: 25 ug via INTRAVENOUS

## 2019-10-10 MED ORDER — SODIUM CHLORIDE 0.9 % IV SOLN
1.0000 g | INTRAVENOUS | Status: DC
  Administered 2019-10-10 – 2019-10-12 (×3): 1 g via INTRAVENOUS
  Filled 2019-10-10: qty 1
  Filled 2019-10-10: qty 10
  Filled 2019-10-10: qty 1
  Filled 2019-10-10 (×2): qty 10

## 2019-10-10 MED ORDER — FENTANYL CITRATE (PF) 100 MCG/2ML IJ SOLN: 25.0000 ug | INTRAMUSCULAR | Status: DC | PRN

## 2019-10-10 MED ORDER — PROPOFOL 500 MG/50ML IV EMUL
INTRAVENOUS | Status: DC | PRN
  Administered 2019-10-10: 155 ug/kg/min via INTRAVENOUS

## 2019-10-10 MED ORDER — APIXABAN 5 MG PO TABS
5.0000 mg | ORAL_TABLET | Freq: Two times a day (BID) | ORAL | Status: DC
  Administered 2019-10-10 – 2019-10-12 (×4): 5 mg via ORAL
  Filled 2019-10-10 (×4): qty 1

## 2019-10-10 MED ORDER — LIDOCAINE HCL (CARDIAC) PF 100 MG/5ML IV SOSY
PREFILLED_SYRINGE | INTRAVENOUS | Status: DC | PRN
  Administered 2019-10-10: 100 mg via INTRAVENOUS

## 2019-10-10 MED ORDER — GLYCOPYRROLATE 0.2 MG/ML IJ SOLN
INTRAMUSCULAR | Status: DC | PRN
  Administered 2019-10-10: .1 mg via INTRAVENOUS

## 2019-10-10 MED ORDER — PROPOFOL 10 MG/ML IV BOLUS
INTRAVENOUS | Status: DC | PRN
  Administered 2019-10-10: 50 mg via INTRAVENOUS

## 2019-10-10 NOTE — Progress Notes (Signed)
[name], female   DOB: Sep 01, 1955, 64 y.o.   MRN: [id number] [facility] PROGRESS NOTE  Jamie Arias KGY:[id number] DOB: Jan 05, 1956 DOA: 10/08/2019 PCP: [name], MD  HPI/Subjective: Patient still feeling miserable with abdominal pain and nausea.  Patient was seen prior to endoscopy.  Came in with abdominal pain nausea and diarrhea.  Has not had diarrhea since admission.  Patient asked nurse for diet after endoscopy.  Objective: Vitals:   10/10/19 0940 10/10/19 1121  BP: (!) 152/78 (!) 178/67  Pulse: 86   Resp: 12   Temp:    SpO2: 100%     Intake/Output Summary (Last 24 hours) at 10/10/2019 1241 Last data filed at 10/10/2019 0914 Gross per 24 hour  Intake 100 ml  Output 1280 ml  Net -1180 ml   Filed Weights   10/08/19 1059 10/10/19 0843  Weight: 77.6 kg 29.4 kg    ROS: Review of Systems  Respiratory: Negative for shortness of breath.   Cardiovascular: Negative for chest pain.  Gastrointestinal: Positive for abdominal pain and nausea.   Exam: Physical Exam HENT:     Nose: No mucosal edema.     Mouth/Throat:     Pharynx: No oropharyngeal exudate.  Eyes:     General: Lids are normal.     Conjunctiva/sclera: Conjunctivae normal.     Pupils: Pupils are equal, round, and reactive to light.  Cardiovascular:     Rate and Rhythm: Normal rate and regular rhythm.     Heart sounds: Normal heart sounds, S1 normal and S2 normal.  Pulmonary:     Breath sounds: No decreased breath sounds, wheezing, rhonchi or rales.  Abdominal:     Palpations: Abdomen is soft.     Tenderness: There is abdominal tenderness in the epigastric area.  Musculoskeletal:     Right ankle: No swelling.     Left ankle: No swelling.  Skin:    General: Skin is warm.     Findings: No rash.  Neurological:     Mental Status: She is alert and oriented to person, place, and time.       Data Reviewed: Basic Metabolic Panel: Recent Labs  Lab 10/08/19 1111 10/09/19 0431  10/09/19 0847 10/10/19 0703  NA 141 138  --  139  K 4.4 3.4*  --  3.3*  CL 94* 97*  --  98  CO2 29 30  --  26  GLUCOSE 192* 121*  --  128*  BUN 28* 14  --  11  CREATININE 5.46* 3.09*  --  2.53*  CALCIUM 9.5 8.6*  --  8.7*  PHOS  --   --  2.6  --    Liver Function Tests: Recent Labs  Lab 10/08/19 1111  AST 93*  ALT 17  ALKPHOS 50  BILITOT 2.2*  PROT 7.4  ALBUMIN 3.1*   Recent Labs  Lab 10/08/19 1111  LIPASE 17   CBC: Recent Labs  Lab 10/08/19 1111 10/09/19 0431 10/10/19 0703  WBC 4.8 5.7 5.7  HGB 8.0* 7.9* 7.7*  HCT 25.2* 24.2* 24.7*  MCV 93.3 91.7 93.6  PLT 158 145* 148*   BNP (last 3 results) Recent Labs    03/01/19 0429  BNP 720.0*      Recent Results (from the past 240 hour(s))  SARS Coronavirus 2 by RT PCR (hospital order, performed in [facility] hospital lab) Nasopharyngeal Nasopharyngeal Swab     Status: None   Collection Time: 10/08/19  2:29 PM  Specimen: Nasopharyngeal Swab  Result Value Ref Range Status   SARS Coronavirus 2 NEGATIVE NEGATIVE Final    Comment: (NOTE) SARS-CoV-2 target nucleic acids are NOT DETECTED.  The SARS-CoV-2 RNA is generally detectable in upper and lower respiratory specimens during the acute phase of infection. The lowest concentration of SARS-CoV-2 viral copies this assay can detect is 250 copies / mL. A negative result does not preclude SARS-CoV-2 infection and should not be used as the sole basis for treatment or other patient management decisions.  A negative result may occur with improper specimen collection / handling, submission of specimen other than nasopharyngeal swab, presence of viral mutation(s) within the areas targeted by this assay, and inadequate number of viral copies (<250 copies / mL). A negative result must be combined with clinical observations, patient history, and epidemiological information.  Fact Sheet for Patients:   StrictlyIdeas.no  Fact Sheet for  Healthcare Providers: BankingDealers.co.za  This test is not yet approved or  cleared by the [place] FDA and has been authorized for detection and/or diagnosis of SARS-CoV-2 by FDA under an Emergency Use Authorization (EUA).  This EUA will remain in effect (meaning this test can be used) for the duration of the COVID-19 declaration under Section 564(b)(1) of the Act, 21 U.S.C. section 360bbb-3(b)(1), unless the authorization is terminated or revoked sooner.  Performed at [facility], [address] [place], [place] [address]   Urine Culture     Status: Abnormal   Collection Time: 10/08/19  2:31 PM   Specimen: Urine, Random  Result Value Ref Range Status   Specimen Description   Final    URINE, RANDOM Performed at [facility], [address] [place], [place] [address]    Special Requests   Final    NONE Performed at [facility], [address] [place], [place] [address]    Culture >=100,000 COLONIES/mL ESCHERICHIA COLI (A)  Final   Report Status 10/10/2019 FINAL  Final   Organism ID, Bacteria ESCHERICHIA COLI (A)  Final      Susceptibility   Escherichia coli - MIC*    AMPICILLIN >=32 RESISTANT Resistant     CEFAZOLIN <=4 SENSITIVE Sensitive     CEFTRIAXONE <=0.25 SENSITIVE Sensitive     CIPROFLOXACIN INTERMEDIATE Intermediate     GENTAMICIN <=1 SENSITIVE Sensitive     IMIPENEM <=0.25 SENSITIVE Sensitive     NITROFURANTOIN <=16 SENSITIVE Sensitive     TRIMETH/SULFA >=320 RESISTANT Resistant     AMPICILLIN/SULBACTAM 16 INTERMEDIATE Intermediate     PIP/TAZO <=4 SENSITIVE Sensitive     * >=100,000 COLONIES/mL ESCHERICHIA COLI  MRSA PCR Screening     Status: None   Collection Time: 10/09/19  2:46 AM   Specimen: Nasopharyngeal  Result Value Ref Range Status   MRSA by PCR NEGATIVE NEGATIVE Final    Comment:        The GeneXpert MRSA Assay (FDA approved for NASAL specimens only), is one component of  a comprehensive MRSA colonization surveillance program. It is not intended to diagnose MRSA infection nor to guide or monitor treatment for MRSA infections. Performed at [facility], [address], [place], [place] [address]      Studies: CT ABDOMEN PELVIS W CONTRAST  Result Date: 10/08/2019 CLINICAL DATA:  Acute abdominal pain. EXAM: CT ABDOMEN AND PELVIS WITH CONTRAST TECHNIQUE: Multidetector CT imaging of the abdomen and pelvis was performed using the standard protocol following bolus administration of intravenous contrast. CONTRAST:  115mL OMNIPAQUE IOHEXOL 300 MG/ML  SOLN COMPARISON:  June 29, 2019. FINDINGS: Lower chest: Small right pleural effusion is noted with adjacent subsegmental atelectasis. Left basilar atelectasis is noted as well. Hepatobiliary: No focal liver abnormality is seen. Status post cholecystectomy. No biliary dilatation. Pancreas: Unremarkable. No pancreatic ductal dilatation or surrounding inflammatory changes. Spleen: Normal in size without focal abnormality. Adrenals/Urinary Tract: Adrenal glands appear normal. Bilateral renal atrophy is noted. No hydronephrosis or renal obstruction is noted. No renal or ureteral calculi are noted. Mild bladder wall thickening is noted which may represent cystitis. Stomach/Bowel: Stomach is within normal limits. Appendix appears normal. No evidence of bowel wall thickening, distention, or inflammatory changes. Vascular/Lymphatic: Aortic atherosclerosis. No enlarged abdominal or pelvic lymph nodes. Reproductive: Uterus and bilateral adnexa are unremarkable. Other: No abdominal wall hernia or abnormality. No abdominopelvic ascites. Musculoskeletal: No acute or significant osseous findings. IMPRESSION: 1. Small right pleural effusion is noted with adjacent subsegmental atelectasis. Left basilar atelectasis is noted as well. 2. Bilateral renal atrophy is noted. 3. Mild bladder wall thickening is noted which may represent cystitis.  4. No other significant abnormality seen in the abdomen or pelvis. Aortic Atherosclerosis (ICD10-I70.0). Electronically Signed   By: Marijo Conception M.D.   On: 10/08/2019 13:35   DG Chest Portable 1 View  Result Date: 10/08/2019 CLINICAL DATA:  Cough and short of breath.  Dialysis patient. EXAM: PORTABLE CHEST 1 VIEW COMPARISON:  06/29/2019 FINDINGS: Cardiac enlargement. Prior CABG. Left jugular central venous catheter tip in the proximal SVC unchanged. Pulmonary vascular congestion. No edema. Left pleural thickening unchanged consistent with pleural scarring as noted on CT. Mild left lower lobe atelectasis. IMPRESSION: Pulmonary vascular congestion without edema. No change in small left   pleural thickening. Electronically Signed   By: Franchot Gallo M.D.   On: 10/08/2019 13:52    Scheduled Meds: . amiodarone  200 mg Oral Daily  . vitamin C  500 mg Oral BID  . atorvastatin  40 mg Oral Daily  . Chlorhexidine Gluconate Cloth  6 each Topical Q0600  . feeding supplement  1 Container Oral TID BM  . FLUoxetine  20 mg Oral Daily  . folic acid  1 mg Oral Daily  . hydrALAZINE  10 mg Oral BID  . levothyroxine  88 mcg Oral Q0600  . multivitamin  1 tablet Oral QHS  . pantoprazole (PROTONIX) IV  40 mg Intravenous Q12H  . sodium chloride flush  3 mL Intravenous Once  . traZODone  50 mg Oral QHS   Continuous Infusions: . sodium chloride    . sodium chloride    . fluconazole (DIFLUCAN) IV      Assessment/Plan:  1. Nausea, epigastric abdominal pain.  Endoscopy today showed some erythema and whitish plaques in the esophagus.  Continue Protonix IV.  Add Diflucan just in case the whitish plaques or thrush.  Advance diet to see how she does.  If still not feeling well tomorrow may have to do a CT angiogram to see if this is ischemic colitis.  Patient has not had any diarrhea here so stool studies never sent. 2. Essential hypertension.  On hydralazine. 3. End-stage renal disease on dialysis 4. Paroxysmal  atrial fibrillation.  On amiodarone.  Restart Eliquis this evening.  5. Anemia of chronic disease.  Hemoglobin on the lower side.  Continue to monitor. 6. Hyperlipidemia unspecified on atorvastatin    Code Status:     Code Status Orders  (From admission, onward)         Start  Ordered   10/08/19 1508  Full code  Continuous        10/08/19 1508        Code Status History    Date Active Date Inactive Code Status Order ID Comments User Context   09/09/2019 0359 09/10/2019 2011 Full Code [id number]  Athena Masse, MD ED   06/29/2019 2028 07/01/2019 0108 Full Code [id number]  Ivor Costa, MD Inpatient   02/21/2019 0932 04/21/2019 0155 DNR [id number]  Sidney Ace, MD Inpatient   02/09/2019 1755 02/21/2019 0932 Full Code [id number]  Thornell Mule, MD ED   08/24/2018 0022 09/15/2018 1720 Full Code [id number]  Shela Leff, MD Inpatient   09/04/2017 1510 09/06/2017 1920 Full Code [id number]  Epifanio Lesches, MD ED   02/15/2017 1243 02/16/2017 2023 Full Code [id number]  Saundra Shelling, MD Inpatient   10/23/2015 0828 10/23/2015 0908 Full Code [id number]  Saundra Shelling, MD Inpatient   09/03/2015 0130 09/03/2015 1619 Full Code [id number]  Lance Coon, MD Inpatient   07/02/2013 1327 07/14/2013 1540 Full Code [id number]  Nani Skillern, PA-C Inpatient   06/29/2013 2153 07/02/2013 1327 Full Code [id number]  Larey Dresser, MD Inpatient   Advance Care Planning Activity     Family Communication: Spoke with husband on the phone Disposition Plan: Status is: Inpatient  Dispo: The patient is from: Home              Anticipated d/c is to: Home              Anticipated d/c date is: Take a day by day on how she does with advance diet.  May need further work-up if unable to tolerate diet              Patient currently still with abdominal pain as of this morning.  Consultants:  Nephrology  Gastroenterology  Time spent: 27 minutes  [place]

## 2019-10-10 NOTE — Anesthesia Postprocedure Evaluation (Signed)
Anesthesia Post Note  Patient: Jamie Arias  Procedure(s) Performed: ESOPHAGOGASTRODUODENOSCOPY (EGD) WITH PROPOFOL (N/A )  Patient location during evaluation: PACU Anesthesia Type: General Level of consciousness: awake and alert Pain management: pain level controlled Vital Signs Assessment: post-procedure vital signs reviewed and stable Respiratory status: spontaneous breathing, nonlabored ventilation, respiratory function stable and patient connected to nasal cannula oxygen Cardiovascular status: blood pressure returned to baseline and stable Postop Assessment: no apparent nausea or vomiting Anesthetic complications: no   No complications documented.   Last Vitals:  Vitals:   10/10/19 0938 10/10/19 0940  BP: (!) 161/72 (!) 152/78  Pulse: 75 86  Resp: (!) 21 12  Temp:    SpO2: 100% 100%    Last Pain:  Vitals:   10/10/19 0938  TempSrc:   PainSc: Asleep                 Precious Haws Bayan Kushnir

## 2019-10-10 NOTE — Transfer of Care (Signed)
Immediate Anesthesia Transfer of Care Note  Patient: Jamie Arias  Procedure(s) Performed: ESOPHAGOGASTRODUODENOSCOPY (EGD) WITH PROPOFOL (N/A )  Patient Location: PACU  Anesthesia Type:General  Level of Consciousness: drowsy and responds to stimulation  Airway & Oxygen Therapy: Patient Spontanous Breathing and Patient connected to face mask oxygen  Post-op Assessment: Report given to RN and Post -op Vital signs reviewed and stable  Post vital signs: Reviewed and stable  Last Vitals:  Vitals Value Taken Time  BP 164/53 10/10/19 0925  Temp    Pulse 64 10/10/19 0927  Resp 24 10/10/19 0927  SpO2 100 % 10/10/19 0927  Vitals shown include unvalidated device data.  Last Pain:  Vitals:   10/10/19 0843  TempSrc:   PainSc: 4       Patients Stated Pain Goal: 0 (18/29/93 7169)  Complications: No complications documented.

## 2019-10-10 NOTE — Progress Notes (Signed)
[facility]  ROUNDING NOTE   Subjective:   The patient reports feeling tired from her anesthesia. Had an EGD done this morning. States she feels like her abdominal pain and diarrhea are improving.  Patient's husband was present at bedside but patient was able to provide the history.  She denies any shortness of breath or edema.  She reports tolerating HD well yesterday, no lightheadedness, cramping. Net UF of 1280 mL.   Objective:  Vital signs in last 24 hours:  Temp:  [97.5 F (36.4 C)-98.2 F (36.8 C)] 97.6 F (36.4 C) (08/07 0926) Pulse Rate:  [56-135] 86 (08/07 0940) Resp:  [12-21] 12 (08/07 0940) BP: (124-178)/(53-88) 178/67 (08/07 1121) SpO2:  [77 %-100 %] 100 % (08/07 0940) Weight:  [29.4 kg] 29.4 kg (08/07 0843)  Weight change:  Filed Weights   10/08/19 1059 10/10/19 0843  Weight: 77.6 kg 29.4 kg    Intake/Output: I/O last 3 completed shifts: In: -  Out: 2780 [Other:2780]   Intake/Output this shift:  Total I/O In: 100 [I.V.:100] Out: 0   Physical Exam: General: NAD, resting in bed.  Head: Normocephalic, atraumatic. Moist oral mucosal membranes  Eyes: Anicteric, PERRL  Neck: Supple, trachea midline  Lungs:  Fine Crackles at bases   Heart: Regular rate and rhythm  Abdomen:  Soft, nontender,   Extremities:  No peripheral edema.  Neurologic: Nonfocal, moving all four extremities  Skin: No lesions  Access: Left AVG     Basic Metabolic Panel: Recent Labs  Lab 10/08/19 1111 10/09/19 0431 10/09/19 0847 10/10/19 0703  NA 141 138  --  139  K 4.4 3.4*  --  3.3*  CL 94* 97*  --  98  CO2 29 30  --  26  GLUCOSE 192* 121*  --  128*  BUN 28* 14  --  11  CREATININE 5.46* 3.09*  --  2.53*  CALCIUM 9.5 8.6*  --  8.7*  PHOS  --   --  2.6  --     Liver Function Tests: Recent Labs  Lab 10/08/19 1111  AST 93*  ALT 17  ALKPHOS 50  BILITOT 2.2*  PROT 7.4  ALBUMIN 3.1*   Recent Labs  Lab 10/08/19 1111  LIPASE 17   No results for  input(s): AMMONIA in the last 168 hours.  CBC: Recent Labs  Lab 10/08/19 1111 10/09/19 0431 10/10/19 0703  WBC 4.8 5.7 5.7  HGB 8.0* 7.9* 7.7*  HCT 25.2* 24.2* 24.7*  MCV 93.3 91.7 93.6  PLT 158 145* 148*    Cardiac Enzymes: No results for input(s): CKTOTAL, CKMB, CKMBINDEX, TROPONINI in the last 168 hours.  BNP: Invalid input(s): POCBNP  CBG: Recent Labs  Lab 10/10/19 0853  GLUCAP 118*    Microbiology: Results for orders placed or performed during the hospital encounter of 10/08/19  SARS Coronavirus 2 by RT PCR (hospital order, performed in [facility] hospital lab) Nasopharyngeal Nasopharyngeal Swab     Status: None   Collection Time: 10/08/19  2:29 PM   Specimen: Nasopharyngeal Swab  Result Value Ref Range Status   SARS Coronavirus 2 NEGATIVE NEGATIVE Final    Comment: (NOTE) SARS-CoV-2 target nucleic acids are NOT DETECTED.  The SARS-CoV-2 RNA is generally detectable in upper and lower respiratory specimens during the acute phase of infection. The lowest concentration of SARS-CoV-2 viral copies this assay can detect is 250 copies / mL. A negative result does not preclude SARS-CoV-2 infection and should not be used as the sole basis  for treatment or other patient management decisions.  A negative result may occur with improper specimen collection / handling, submission of specimen other than nasopharyngeal swab, presence of viral mutation(s) within the areas targeted by this assay, and inadequate number of viral copies (<250 copies / mL). A negative result must be combined with clinical observations, patient history, and epidemiological information.  Fact Sheet for Patients:   StrictlyIdeas.no  Fact Sheet for Healthcare Providers: BankingDealers.co.za  This test is not yet approved or  cleared by the [place] FDA and has been authorized for detection and/or diagnosis of SARS-CoV-2 by FDA under an Emergency  Use Authorization (EUA).  This EUA will remain in effect (meaning this test can be used) for the duration of the COVID-19 declaration under Section 564(b)(1) of the Act, 21 U.S.C. section 360bbb-3(b)(1), unless the authorization is terminated or revoked sooner.  Performed at [facility], [address] [place], [place] [address]   Urine Culture     Status: Abnormal   Collection Time: 10/08/19  2:31 PM   Specimen: Urine, Random  Result Value Ref Range Status   Specimen Description   Final    URINE, RANDOM Performed at [facility], [address] [place], [place] [address]    Special Requests   Final    NONE Performed at [facility] [address] [place], [place] [address]    Culture >=100,000 COLONIES/mL ESCHERICHIA COLI (A)  Final   Report Status 10/10/2019 FINAL  Final   Organism ID, Bacteria ESCHERICHIA COLI (A)  Final      Susceptibility   Escherichia coli - MIC*    AMPICILLIN >=32 RESISTANT Resistant     CEFAZOLIN <=4 SENSITIVE Sensitive     CEFTRIAXONE <=0.25 SENSITIVE Sensitive     CIPROFLOXACIN INTERMEDIATE Intermediate     GENTAMICIN <=1 SENSITIVE Sensitive     IMIPENEM <=0.25 SENSITIVE Sensitive     NITROFURANTOIN <=16 SENSITIVE Sensitive     TRIMETH/SULFA >=320 RESISTANT Resistant     AMPICILLIN/SULBACTAM 16 INTERMEDIATE Intermediate     PIP/TAZO <=4 SENSITIVE Sensitive     * >=100,000 COLONIES/mL ESCHERICHIA COLI  MRSA PCR Screening     Status: None   Collection Time: 10/09/19  2:46 AM   Specimen: Nasopharyngeal  Result Value Ref Range Status   MRSA by PCR NEGATIVE NEGATIVE Final    Comment:        The GeneXpert MRSA Assay (FDA approved for NASAL specimens only), is one component of a comprehensive MRSA colonization surveillance program. It is not intended to diagnose MRSA infection nor to guide or monitor treatment for MRSA infections. Performed at [facility], [address] [place], [place] [address]      Coagulation Studies: No results for input(s): LABPROT, INR in the last 72 hours.  Urinalysis: Recent Labs    10/08/19 1431  COLORURINE YELLOW*  LABSPEC 1.010  PHURINE 7.0  GLUCOSEU NEGATIVE  HGBUR SMALL*  BILIRUBINUR NEGATIVE  KETONESUR 5*  PROTEINUR >=300*  NITRITE NEGATIVE  LEUKOCYTESUR MODERATE*      Imaging: CT ABDOMEN PELVIS W CONTRAST  Result Date: 10/08/2019 CLINICAL DATA:  Acute abdominal pain. EXAM: CT ABDOMEN AND PELVIS WITH CONTRAST TECHNIQUE: Multidetector CT imaging of the abdomen and pelvis was performed using the standard protocol following bolus administration of intravenous contrast. CONTRAST:  145mL OMNIPAQUE IOHEXOL 300 MG/ML  SOLN COMPARISON:  June 29, 2019. FINDINGS: Lower chest: Small right pleural effusion is noted with adjacent subsegmental atelectasis. Left basilar atelectasis is noted  as well. Hepatobiliary: No focal liver abnormality is seen. Status post cholecystectomy. No biliary dilatation. Pancreas: Unremarkable. No pancreatic ductal dilatation or surrounding inflammatory changes. Spleen: Normal in size without focal abnormality. Adrenals/Urinary Tract: Adrenal glands appear normal. Bilateral renal atrophy is noted. No hydronephrosis or renal obstruction is noted. No renal or ureteral calculi are noted. Mild bladder wall thickening is noted which may represent cystitis. Stomach/Bowel: Stomach is within normal limits. Appendix appears normal. No evidence of bowel wall thickening, distention, or inflammatory changes. Vascular/Lymphatic: Aortic atherosclerosis. No enlarged abdominal or pelvic lymph nodes. Reproductive: Uterus and bilateral adnexa are unremarkable. Other: No abdominal wall hernia or abnormality. No abdominopelvic ascites. Musculoskeletal: No acute or significant osseous findings. IMPRESSION: 1. Small right pleural effusion is noted with adjacent subsegmental atelectasis. Left basilar atelectasis is noted as well. 2. Bilateral renal atrophy is  noted. 3. Mild bladder wall thickening is noted which may represent cystitis. 4. No other significant abnormality seen in the abdomen or pelvis. Aortic Atherosclerosis (ICD10-I70.0). Electronically Signed   By: Marijo Conception M.D.   On: 10/08/2019 13:35   DG Chest Portable 1 View  Result Date: 10/08/2019 CLINICAL DATA:  Cough and short of breath.  Dialysis patient. EXAM: PORTABLE CHEST 1 VIEW COMPARISON:  06/29/2019 FINDINGS: Cardiac enlargement. Prior CABG. Left jugular central venous catheter tip in the proximal SVC unchanged. Pulmonary vascular congestion. No edema. Left pleural thickening unchanged consistent with pleural scarring as noted on CT. Mild left lower lobe atelectasis. IMPRESSION: Pulmonary vascular congestion without edema. No change in small left   pleural thickening. Electronically Signed   By: Franchot Gallo M.D.   On: 10/08/2019 13:52     Medications:   . sodium chloride    . sodium chloride     . amiodarone  200 mg Oral Daily  . vitamin C  500 mg Oral BID  . atorvastatin  40 mg Oral Daily  . Chlorhexidine Gluconate Cloth  6 each Topical Q0600  . feeding supplement  1 Container Oral TID BM  . FLUoxetine  20 mg Oral Daily  . folic acid  1 mg Oral Daily  . hydrALAZINE  10 mg Oral BID  . levothyroxine  88 mcg Oral Q0600  . multivitamin  1 tablet Oral QHS  . pantoprazole (PROTONIX) IV  40 mg Intravenous Q12H  . sodium chloride flush  3 mL Intravenous Once  . traZODone  50 mg Oral QHS   sodium chloride, sodium chloride, acetaminophen, alteplase, fluticasone, heparin, lidocaine (PF), lidocaine-prilocaine, melatonin, naLOXone (NARCAN)  injection, nitroGLYCERIN, ondansetron **OR** ondansetron (ZOFRAN) IV, oxyCODONE, pentafluoroprop-tetrafluoroeth, promethazine  Assessment/ Plan:  Jamie Arias is a 63 y.o.  female end stage renal disease on hemodialysis, hypertension, pulmonary hypertension, hypothroidism, diabetes mellitus type II, diabetic neuropathy, Hyperlipidemia,   congestive heart failure  was admitted on  10/08/2019 vfor abdominal pain, hypoxia, nausea, vomiting and diarrhea.   [facility]/ [address]/ MWF/ 78.5   1. ESRD on HD  - Patient had HD yesterday with net UF of 1280 mL. On 3k bath. k 3.3 - No indication for HD today.  - Will likely need to decrease dry weight at outpatient center to 78kg.    2. Anemia in CKD - will need Epogen on treatment  - hgb 7.7   3. Hypertension  - BP 178/67 - on hydralazine 10mg     LOS: 2 [place] P Tedrow 8/7/202111:32 AM   Patient was seen and examined with physician assistant, Zonia Kief PA-C. Discussed case with patient's  husband. Currently not on binders for secondary hyperparathyroidism. Above plan was discussed and agreed upon on the signing of this note.   Lavonia Dana, MD [facility]  8/7/20212:10 PM

## 2019-10-10 NOTE — Progress Notes (Signed)
PT Cancellation Note  Patient Details Name: Jamie Arias MRN: [id number] DOB: [birth year]   Cancelled Treatment:    Reason Eval/Treat Not Completed: Fatigue/lethargy limiting ability to participate.   [address], [place], [place] DPT 10/10/2019, 2:09 PM

## 2019-10-10 NOTE — Progress Notes (Signed)
ANTICOAGULATION CONSULT NOTE - Initial Consult  Pharmacy Consult for Apixaban  Indication: atrial fibrillation  Allergies  Allergen Reactions  . Nsaids Other (See Comments)    Contraindicated due to kidney disease.  Judeth Cornfield Reductase Inhibitors   . Doxycycline Other (See Comments)    tremor   Patient Measurements: Height: 5\' 4"  (162.6 cm) Weight: 29.4 kg (64 lb 12 oz) IBW/kg (Calculated) : 54.7  Vital Signs: Temp: 97.6 F (36.4 C) (08/07 0926) Temp Source: Oral (08/07 0517) BP: 178/67 (08/07 1121) Pulse Rate: 86 (08/07 0940)  Labs: Recent Labs    10/08/19 1111 10/08/19 1111 10/08/19 1246 10/08/19 1431 10/09/19 0431 10/10/19 0703  HGB 8.0*   < >  --   --  7.9* 7.7*  HCT 25.2*  --   --   --  24.2* 24.7*  PLT 158  --   --   --  145* 148*  CREATININE 5.46*  --   --   --  3.09* 2.53*  TROPONINIHS  --   --  37* 37*  --   --    < > = values in this interval not displayed.    Estimated Creatinine Clearance: 10.3 mL/min (A) (by C-G formula based on SCr of 2.53 mg/dL (H)).   Medical History: Past Medical History:  Diagnosis Date  . Anemia   . Anginal pain ([place])   . Arthritis    upper back and neck   . Chronic diastolic CHF (congestive heart failure) ([place])    a. Due to ischemic cardiomyopathy. EF as low as 35%, improved to normal s/p CABG; b. echo 07/06/13: EF 55-60%, no RWMAs, mod TR, trivial pericardial effusion not c/w tamponade physiology;  c. 10/2015 Echo: EF 65%, Gr1 DD, triv AI, mild MR, mildly dil LA, mod TR, PASP 91mmHg.  Marland Kitchen Coronary artery disease    a. NSTEMI 06/2013; b.cath: severe three-vessel CAD w/ EF 30% & mild-mod MR; c. s/p 3 vessel CABG 07/02/13 (LIMA-LAD, SVG-OM, and SVG-RPDA);  d. 10/2015 MV: no ischemia/infarct.  . Diabetes mellitus without complication ([place])   . Diabetic neuropathy ([place])   . Dialysis patient [facility])    MWF  . ESRD (end stage renal disease) ([place])    a. 12/2015 initiated - mwf dialysis.  Marland Kitchen GERD (gastroesophageal reflux disease)   .  Hyperlipidemia   . Hypertension   . Hypothyroidism   . Myocardial infarction ([place]) 2014  . Neuropathy   . Pleural effusion 2015  . Pulmonary hypertension ([place])   . Renal insufficiency   . Wears dentures    full lower    Assessment: Pharmacy consulted for apixaban dosing for 64 yo female with PMH of A. Fib. Patient admitted with nausea and abdominal pain. S/P EGD 8/7. Patient was taking apixaban 5mg  BID prior to admission.   Hgb: 7.7  PLts: 148    Plan:  Will restart patient's home dose of apixaban 5mg  BID starting this evening.   Plan to monitor CBCs at least every 3 days per protocol.   Pernell Dupre, PharmD, BCPS Clinical Pharmacist 10/10/2019 1:43 PM

## 2019-10-10 NOTE — Anesthesia Preprocedure Evaluation (Signed)
Anesthesia Evaluation  Patient identified by MRN, date of birth, ID band Patient awake    Reviewed: Allergy & Precautions, H&P , NPO status , Patient's Chart, lab work & pertinent test results  History of Anesthesia Complications Negative for: history of anesthetic complications  Airway Mallampati: III  TM Distance: <3 FB Neck ROM: limited    Dental  (+) Chipped, Poor Dentition, Missing, Lower Dentures   Pulmonary neg shortness of breath,    Pulmonary exam normal        Cardiovascular Exercise Tolerance: Good hypertension, (-) angina+ CAD, + Past MI and +CHF  Normal cardiovascular exam     Neuro/Psych  Headaches, negative psych ROS   GI/Hepatic Neg liver ROS, GERD  Controlled and Medicated,  Endo/Other  diabetesHypothyroidism   Renal/GU DialysisRenal disease  negative genitourinary   Musculoskeletal  (+) Arthritis ,   Abdominal   Peds  Hematology negative hematology ROS (+)   Anesthesia Other Findings Past Medical History: No date: Anemia No date: Anginal pain ([facility]) No date: Arthritis     Comment:  upper back and neck  No date: Chronic diastolic CHF (congestive heart failure) (H[facility])     Comment:  a. Due to ischemic cardiomyopathy. EF as low as 35%,               improved to normal s/p CABG; b. echo 07/06/13: EF 55-60%,               no RWMAs, mod TR, trivial pericardial effusion not c/w               tamponade physiology;  c. 10/2015 Echo: EF 65%, Gr1 DD,               triv AI, mild MR, mildly dil LA, mod TR, PASP 56mmHg. No date: Coronary artery disease     Comment:  a. NSTEMI 06/2013; b.cath: severe three-vessel CAD w/ EF               30% & mild-mod MR; c. s/p 3 vessel CABG 07/02/13               (LIMA-LAD, SVG-OM, and SVG-RPDA);  d. 10/2015 MV: no               ischemia/infarct. No date: Diabetes mellitus without complication (HCC) No date: Diabetic neuropathy ([place]) No date: Dialysis patient [facility])      Comment:  MWF No date: ESRD (end stage renal disease) ([place])     Comment:  a. 12/2015 initiated - mwf dialysis. No date: GERD (gastroesophageal reflux disease) No date: Hyperlipidemia No date: Hypertension No date: Hypothyroidism 2014: Myocardial infarction ([place]) No date: Neuropathy 2015: Pleural effusion No date: Pulmonary hypertension (HCC) No date: Renal insufficiency No date: Wears dentures     Comment:  full lower  Past Surgical History: 12/17/2016: A/V FISTULAGRAM; Right     Comment:  Procedure: A/V Fistulagram;  Surgeon: Algernon Huxley, MD;               Location: [place] [facility];  Service: Cardiovascular;              Laterality: Right; 01/07/2017: A/V FISTULAGRAM; Right     Comment:  Procedure: A/V Fistulagram;  Surgeon: Algernon Huxley, MD;               Location: [place] [facility];  Service: Cardiovascular;              Laterality: Right; 12/03/2017: A/V  FISTULAGRAM; Right     Comment:  Procedure: A/V FISTULAGRAM;  Surgeon: Katha Cabal, MD;  Location: [place] [facility];  Service:               Cardiovascular;  Laterality: Right; 02/23/2019: A/V FISTULAGRAM; Right     Comment:  Procedure: A/V Fistulagram;  Surgeon: Algernon Huxley, MD;               Location: [place] [facility];  Service: Cardiovascular;              Laterality: Right; 02/22/2017: A/V SHUNT INTERVENTION; N/A     Comment:  Procedure: A/V SHUNT INTERVENTION;  Surgeon: Algernon Huxley, MD;  Location: [place] [facility];  Service:               Cardiovascular;  Laterality: N/A; 02/03/2016: AV FISTULA PLACEMENT; Right     Comment:  Procedure: INSERTION OF ARTERIOVENOUS (AV) GORE-TEX               GRAFT ARM ( BRACH / AXILLARY );  Surgeon: Katha Cabal, MD;  Location: [facility];  Service: Vascular;                Laterality: Right; 07/09/2019: AV FISTULA PLACEMENT; Left     Comment:  Procedure: INSERTION OF ARTERIOVENOUS (AV) GORE-TEX               GRAFT  ARM ( BRACHIAL AXILLARY );  Surgeon: Algernon Huxley,               MD;  Location: [facility];  Service: Vascular;  Laterality:              Left; 03/04/2019: [place] REMOVAL; Right     Comment:  Procedure: REMOVAL OF ARTERIOVENOUS GORETEX GRAFT               ([place]);  Surgeon: Algernon Huxley, MD;  Location: [facility];               Service: Vascular;  Laterality: Right; No date: CARDIAC CATHETERIZATION No date: CATARACT EXTRACTION; Bilateral 12/09/2014: CHOLECYSTECTOMY; N/A     Comment:  Procedure: LAPAROSCOPIC CHOLECYSTECTOMY;  Surgeon:               Marlyce [name], MD;  Location: [facility];  Service:              General;  Laterality: N/A; 07/02/2013: CORONARY ARTERY BYPASS GRAFT; N/A     Comment:  Procedure: CORONARY ARTERY BYPASS GRAFTING (CABG);                Surgeon: Ivin [name], MD;  Location: [place];  Service:              Open Heart Surgery;  Laterality: N/A;  CABG x three,               using left internal mammary artery and right leg greater               saphenous vein harvested endoscopically 04/11/2019: ESOPHAGOGASTRODUODENOSCOPY; N/A     Comment:  Procedure: ESOPHAGOGASTRODUODENOSCOPY (EGD);  Surgeon:               Jonathon Bellows, MD;  Location: [facility];  Service:               Gastroenterology;  Laterality: N/A; 11/24/2015: ESOPHAGOGASTRODUODENOSCOPY (EGD) WITH PROPOFOL; N/A     Comment:  Procedure: ESOPHAGOGASTRODUODENOSCOPY (EGD) WITH               PROPOFOL;  Surgeon: [name], MD;  Location: [place];  Service: Endoscopy;  Laterality: N/A;                Diabetic - insulin No date: EYE SURGERY; Bilateral     Comment:  Cataract Extraction with IOL 07/02/2013: INTRAOPERATIVE TRANSESOPHAGEAL ECHOCARDIOGRAM; N/A     Comment:  Procedure: INTRAOPERATIVE TRANSESOPHAGEAL               ECHOCARDIOGRAM;  Surgeon: Ivin [name], MD;  Location:              [place];  Service: Open Heart Surgery;  Laterality: N/A; 03/03/2019: PEG PLACEMENT; N/A      Comment:  Procedure: PERCUTANEOUS ENDOSCOPIC GASTROSTOMY (PEG)               PLACEMENT;  Surgeon: Virgel Manifold, MD;  Location:              [facility];  Service: Endoscopy;  Laterality: N/A; 12/06/2015: PERIPHERAL VASCULAR CATHETERIZATION; Right     Comment:  Procedure: Dialysis/Perma Catheter Insertion;  Surgeon:               Katha Cabal, MD;  Location: [place] [facility];                Service: Cardiovascular;  Laterality: Right; 09/28/2016: PERIPHERAL VASCULAR THROMBECTOMY; Right     Comment:  Procedure: Peripheral Vascular Thrombectomy;  Surgeon:               Katha Cabal, MD;  Location: [place] [facility];               Service: Cardiovascular;  Laterality: Right; 05/20/2018: PERIPHERAL VASCULAR THROMBECTOMY; Right     Comment:  Procedure: PERIPHERAL VASCULAR THROMBECTOMY;  Surgeon:               Katha Cabal, MD;  Location: [place] [facility];               Service: Cardiovascular;  Laterality: Right; 06/01/2016: PORTA CATH REMOVAL; N/A     Comment:  Procedure: Porta Cath Removal;  Surgeon: Katha Cabal, MD;  Location: [place] [facility];  Service:               Cardiovascular;  Laterality: N/A; 2015: THORACENTESIS; Left  BMI    Body Mass Index: 29.37 kg/m      Reproductive/Obstetrics negative OB ROS                             Anesthesia Physical Anesthesia Plan  ASA: IV  Anesthesia Plan: General   Post-op Pain Management:    Induction: Intravenous  PONV Risk Score and Plan: Propofol infusion and TIVA  Airway Management Planned: Natural Airway and Nasal Cannula  Additional Equipment:   Intra-op Plan:   Post-operative Plan:   Informed Consent: I have reviewed the patients History and Physical, chart, labs and discussed the procedure including the  risks, benefits and alternatives for the proposed anesthesia with the patient or authorized representative who has indicated his/her  understanding and acceptance.     Dental Advisory Given  Plan Discussed with: Anesthesiologist, CRNA and Surgeon  Anesthesia Plan Comments: (Patient consented for risks of anesthesia including but not limited to:  - adverse reactions to medications - risk of intubation if required - damage to eyes, teeth, lips or other oral mucosa - nerve damage due to positioning  - sore throat or hoarseness - Damage to heart, brain, nerves, lungs, other parts of body or loss of life  Patient voiced understanding.)        Anesthesia Quick Evaluation

## 2019-10-10 NOTE — Anesthesia Procedure Notes (Signed)
Procedure Name: General with mask airway Performed by: Fletcher-Harrison, Jenevieve Kirschbaum, CRNA Pre-anesthesia Checklist: Patient identified, Emergency Drugs available, Suction available and Patient being monitored Patient Re-evaluated:Patient Re-evaluated prior to induction Oxygen Delivery Method: Simple face mask Induction Type: IV induction Placement Confirmation: positive ETCO2 and CO2 detector Dental Injury: Teeth and Oropharynx as per pre-operative assessment        

## 2019-10-10 NOTE — Op Note (Signed)
[facility] Gastroenterology Patient Name: Jamie Arias Procedure Date: 10/10/2019 8:54 AM MRN: [id number] Account #: [id number] Date of Birth: 1955-08-14 Admit Type: Outpatient Age: 64 Room: [facility] ENDO ROOM 3 Gender: Female Note Status: Finalized Procedure:             Upper GI endoscopy Indications:           Epigastric abdominal pain, Nausea with vomiting Providers:             Orella Cushman B. Bonna Gains MD, MD Medicines:             Monitored Anesthesia Care Complications:         No immediate complications. Procedure:             Pre-Anesthesia Assessment:                        - The risks and benefits of the procedure and the                         sedation options and risks were discussed with the                         patient. All questions were answered and informed                         consent was obtained.                        - Patient identification and proposed procedure were                         verified prior to the procedure.                        - ASA Grade Assessment: III - A patient with severe                         systemic disease.                        After obtaining informed consent, the endoscope was                         passed under direct vision. Throughout the procedure,                         the patient's blood pressure, pulse, and oxygen                         saturations were monitored continuously. The Endoscope                         was introduced through the mouth, and advanced to the                         second part of duodenum. The upper GI endoscopy was                         accomplished with ease. The patient tolerated the  procedure well. Findings:      Patchy mild mucosal changes characterized by discoloration and white       specks were found in the mid esophagus. Biopsies were taken with a cold       forceps for histology.      The exam of the esophagus was otherwise  normal.      Diffuse atrophic mucosa was found in the entire examined stomach.       Biopsies were obtained in the gastric body, at the incisura and in the       gastric antrum with cold forceps for histology.      There is no endoscopic evidence of ulceration in the entire examined       stomach.      A previous PEG tube site was found in the gastric body. The scar tissue       was healthy in appearance.      The exam of the stomach was otherwise normal.      The examined duodenum was normal. Impression:            - Discolored, white specked mucosa in the esophagus.                         Biopsied.                        - Gastric mucosal atrophy.                        - Scar in the gastric body.                        - Normal examined duodenum.                        - Biopsies were obtained in the gastric body, at the                         incisura and in the gastric antrum. Recommendation:        - If abdominal pain continues, primary team to                         consider ischemic workup with CTA abdomen or                         mesenteric doppler                        - Await pathology results.                        - Return patient to hospital ward for ongoing care.                        - Continue present medications.                        - The findings and recommendations were discussed with                         the patient.                        -  The findings and recommendations were discussed with                         the patient. Procedure Code(s):     --- Professional ---                        [phone], Esophagogastroduodenoscopy, flexible,                         transoral; with biopsy, single or multiple Diagnosis Code(s):     --- Professional ---                        K22.8, Other specified diseases of esophagus                        K31.89, Other diseases of stomach and duodenum                        R10.13, Epigastric pain                         R11.2, Nausea with vomiting, unspecified CPT copyright 2019 [employer]. All rights reserved. The codes documented in this report are preliminary and upon coder review may  be revised to meet current compliance requirements.  Vonda Antigua, MD Margretta Sidle B. Bonna Gains MD, MD 10/10/2019 9:27:17 AM This report has been signed electronically. Number of Addenda: 0 Note Initiated On: 10/10/2019 8:54 AM Estimated Blood Loss:  Estimated blood loss: none.      [facility]

## 2019-10-10 NOTE — Progress Notes (Signed)
Vonda Antigua, MD [address], [place], [place], [place], [address] [address], [place], [place], [place], [address] Phone: [phone]  Fax: [phone]   Subjective: Pt reports nausea this morning. No emesis.    Objective: Exam: Vital signs in last 24 hours: Vitals:   10/09/19 1730 10/09/19 1945 10/10/19 0517 10/10/19 0843  BP: (!) 141/82 (!) 166/54 (!) 166/88 (!) 177/73  Pulse: 69 67 65 97  Resp: 14 16 16 20   Temp: (!) 97.5 F (36.4 C) 98.2 F (36.8 C) (!) 97.5 F (36.4 C)   TempSrc: Oral Oral Oral   SpO2:  92% 98% 96%  Weight:    29.4 kg  Height:    5\' 4"  (1.626 m)   Weight change:   Intake/Output Summary (Last 24 hours) at 10/10/2019 2409 Last data filed at 10/10/2019 7353 Gross per 24 hour  Intake --  Output 1280 ml  Net -1280 ml    General: No acute distress, AAO x3 Abd: Soft, NT/ND, No HSM Skin: Warm, no rashes Neck: Supple, Trachea midline   Lab Results: Lab Results  Component Value Date   WBC 5.7 10/10/2019   HGB 7.7 (L) 10/10/2019   HCT 24.7 (L) 10/10/2019   MCV 93.6 10/10/2019   PLT 148 (L) 10/10/2019   Micro Results: Recent Results (from the past 240 hour(s))  SARS Coronavirus 2 by RT PCR (hospital order, performed in [place] hospital lab) Nasopharyngeal Nasopharyngeal Swab     Status: None   Collection Time: 10/08/19  2:29 PM   Specimen: Nasopharyngeal Swab  Result Value Ref Range Status   SARS Coronavirus 2 NEGATIVE NEGATIVE Final    Comment: (NOTE) SARS-CoV-2 target nucleic acids are NOT DETECTED.  The SARS-CoV-2 RNA is generally detectable in upper and lower respiratory specimens during the acute phase of infection. The lowest concentration of SARS-CoV-2 viral copies this assay can detect is 250 copies / mL. A negative result does not preclude SARS-CoV-2 infection and should not be used as the sole basis for treatment or other patient management decisions.  A negative result may occur with improper specimen  collection / handling, submission of specimen other than nasopharyngeal swab, presence of viral mutation(s) within the areas targeted by this assay, and inadequate number of viral copies (<250 copies / mL). A negative result must be combined with clinical observations, patient history, and epidemiological information.  Fact Sheet for Patients:   StrictlyIdeas.no  Fact Sheet for Healthcare Providers: BankingDealers.co.za  This test is not yet approved or  cleared by the [place] FDA and has been authorized for detection and/or diagnosis of SARS-CoV-2 by FDA under an Emergency Use Authorization (EUA).  This EUA will remain in effect (meaning this test can be used) for the duration of the COVID-19 declaration under Section 564(b)(1) of the Act, 21 U.S.C. section 360bbb-3(b)(1), unless the authorization is terminated or revoked sooner.  Performed at [facility], [address] [place], [place] [address]   Urine Culture     Status: Abnormal   Collection Time: 10/08/19  2:31 PM   Specimen: Urine, Random  Result Value Ref Range Status   Specimen Description   Final    URINE, RANDOM Performed at [facility], [address], [place], [place] [address]    Special Requests   Final    NONE Performed at [facility], [address] [place], [place] [address]    Culture >=100,000 COLONIES/mL ESCHERICHIA COLI (A)  Final   Report Status 10/10/2019  FINAL  Final   Organism ID, Bacteria ESCHERICHIA COLI (A)  Final      Susceptibility   Escherichia coli - MIC*    AMPICILLIN >=32 RESISTANT Resistant     CEFAZOLIN <=4 SENSITIVE Sensitive     CEFTRIAXONE <=0.25 SENSITIVE Sensitive     CIPROFLOXACIN INTERMEDIATE Intermediate     GENTAMICIN <=1 SENSITIVE Sensitive     IMIPENEM <=0.25 SENSITIVE Sensitive     NITROFURANTOIN <=16 SENSITIVE Sensitive     TRIMETH/SULFA >=320 RESISTANT Resistant      AMPICILLIN/SULBACTAM 16 INTERMEDIATE Intermediate     PIP/TAZO <=4 SENSITIVE Sensitive     * >=100,000 COLONIES/mL ESCHERICHIA COLI  MRSA PCR Screening     Status: None   Collection Time: 10/09/19  2:46 AM   Specimen: Nasopharyngeal  Result Value Ref Range Status   MRSA by PCR NEGATIVE NEGATIVE Final    Comment:        The GeneXpert MRSA Assay (FDA approved for NASAL specimens only), is one component of a comprehensive MRSA colonization surveillance program. It is not intended to diagnose MRSA infection nor to guide or monitor treatment for MRSA infections. Performed at [facility], [address] [place],  [address]    Studies/Results: CT ABDOMEN PELVIS W CONTRAST  Result Date: 10/08/2019 CLINICAL DATA:  Acute abdominal pain. EXAM: CT ABDOMEN AND PELVIS WITH CONTRAST TECHNIQUE: Multidetector CT imaging of the abdomen and pelvis was performed using the standard protocol following bolus administration of intravenous contrast. CONTRAST:  139mL OMNIPAQUE IOHEXOL 300 MG/ML  SOLN COMPARISON:  June 29, 2019. FINDINGS: Lower chest: Small right pleural effusion is noted with adjacent subsegmental atelectasis. Left basilar atelectasis is noted as well. Hepatobiliary: No focal liver abnormality is seen. Status post cholecystectomy. No biliary dilatation. Pancreas: Unremarkable. No pancreatic ductal dilatation or surrounding inflammatory changes. Spleen: Normal in size without focal abnormality. Adrenals/Urinary Tract: Adrenal glands appear normal. Bilateral renal atrophy is noted. No hydronephrosis or renal obstruction is noted. No renal or ureteral calculi are noted. Mild bladder wall thickening is noted which may represent cystitis. Stomach/Bowel: Stomach is within normal limits. Appendix appears normal. No evidence of bowel wall thickening, distention, or inflammatory changes. Vascular/Lymphatic: Aortic atherosclerosis. No enlarged abdominal or pelvic lymph nodes. Reproductive:  Uterus and bilateral adnexa are unremarkable. Other: No abdominal wall hernia or abnormality. No abdominopelvic ascites. Musculoskeletal: No acute or significant osseous findings. IMPRESSION: 1. Small right pleural effusion is noted with adjacent subsegmental atelectasis. Left basilar atelectasis is noted as well. 2. Bilateral renal atrophy is noted. 3. Mild bladder wall thickening is noted which may represent cystitis. 4. No other significant abnormality seen in the abdomen or pelvis. Aortic Atherosclerosis (ICD10-I70.0). Electronically Signed   By: Marijo Conception M.D.   On: 10/08/2019 13:35   DG Chest Portable 1 View  Result Date: 10/08/2019 CLINICAL DATA:  Cough and short of breath.  Dialysis patient. EXAM: PORTABLE CHEST 1 VIEW COMPARISON:  06/29/2019 FINDINGS: Cardiac enlargement. Prior CABG. Left jugular central venous catheter tip in the proximal SVC unchanged. Pulmonary vascular congestion. No edema. Left pleural thickening unchanged consistent with pleural scarring as noted on CT. Mild left lower lobe atelectasis. IMPRESSION: Pulmonary vascular congestion without edema. No change in small left   pleural thickening. Electronically Signed   By: Franchot Gallo M.D.   On: 10/08/2019 13:52   Medications:  Scheduled Meds: . [MAR Hold] amiodarone  200 mg Oral Daily  . [MAR Hold] vitamin C  500 mg Oral BID  . [facility]  Hold] atorvastatin  40 mg Oral Daily  . [MAR Hold] Chlorhexidine Gluconate Cloth  6 each Topical Q0600  . [MAR Hold] feeding supplement  1 Container Oral TID BM  . [MAR Hold] FLUoxetine  20 mg Oral Daily  . [MAR Hold] folic acid  1 mg Oral Daily  . [MAR Hold] hydrALAZINE  10 mg Oral BID  . [MAR Hold] levothyroxine  88 mcg Oral Q0600  . [MAR Hold] multivitamin  1 tablet Oral QHS  . [MAR Hold] pantoprazole (PROTONIX) IV  40 mg Intravenous Q12H  . [MAR Hold] sodium chloride flush  3 mL Intravenous Once  . [MAR Hold] traZODone  50 mg Oral QHS   Continuous Infusions: . [MAR Hold] sodium  chloride    . [MAR Hold] sodium chloride     PRN Meds:.[MAR Hold] sodium chloride, [MAR Hold] sodium chloride, [MAR Hold] acetaminophen, [MAR Hold] alteplase, [MAR Hold] fluticasone, [MAR Hold] heparin, [MAR Hold] lidocaine (PF), [MAR Hold] lidocaine-prilocaine, [MAR Hold] melatonin, [MAR Hold] naLOXone (NARCAN)  injection, [MAR Hold] nitroGLYCERIN, [MAR Hold] ondansetron **OR** [MAR Hold] ondansetron (ZOFRAN) IV, [MAR Hold] oxyCODONE, [MAR Hold] pentafluoroprop-tetrafluoroeth, [MAR Hold] promethazine   Assessment: Active Problems:   Anemia of chronic disease   Paroxysmal A-fib (HCC)   Nausea vomiting and diarrhea   Epigastric abdominal pain    Plan: Proceed with EGD today as planned for Nausea and epigastric pain  I have discussed alternative options, risks & benefits,  which include, but are not limited to, bleeding, infection, perforation,respiratory complication & drug reaction.  The patient agrees with this plan & written consent will be obtained.      LOS: 2 days   Vonda Antigua, MD 10/10/2019, 9:07 AM

## 2019-10-10 NOTE — Progress Notes (Signed)
Report called to [facility].  15 minute call to floor.

## 2019-10-11 ENCOUNTER — Encounter: Payer: Self-pay | Admitting: Internal Medicine

## 2019-10-11 ENCOUNTER — Inpatient Hospital Stay: Payer: Medicare Other

## 2019-10-11 DIAGNOSIS — N3000 Acute cystitis without hematuria: Secondary | ICD-10-CM

## 2019-10-11 LAB — CBC
HCT: 25.5 % — ABNORMAL LOW (ref 36.0–46.0)
Hemoglobin: 8.2 g/dL — ABNORMAL LOW (ref 12.0–15.0)
MCH: 28.8 pg (ref 26.0–34.0)
MCHC: 32.2 g/dL (ref 30.0–36.0)
MCV: 89.5 fL (ref 80.0–100.0)
Platelets: 154 10*3/uL (ref 150–400)
RBC: 2.85 MIL/uL — ABNORMAL LOW (ref 3.87–5.11)
RDW: 22.3 % — ABNORMAL HIGH (ref 11.5–15.5)
WBC: 5.9 10*3/uL (ref 4.0–10.5)
nRBC: 0.7 % — ABNORMAL HIGH (ref 0.0–0.2)

## 2019-10-11 LAB — BLOOD GAS, ARTERIAL
Acid-Base Excess: 4.8 mmol/L — ABNORMAL HIGH (ref 0.0–2.0)
Bicarbonate: 29.8 mmol/L — ABNORMAL HIGH (ref 20.0–28.0)
FIO2: 0.28
O2 Saturation: 99.5 %
Patient temperature: 37
pCO2 arterial: 46 mmHg (ref 32.0–48.0)
pH, Arterial: 7.42 (ref 7.350–7.450)
pO2, Arterial: 166 mmHg — ABNORMAL HIGH (ref 83.0–108.0)

## 2019-10-11 LAB — GLUCOSE, CAPILLARY: Glucose-Capillary: 114 mg/dL — ABNORMAL HIGH (ref 70–99)

## 2019-10-11 MED ORDER — NALOXONE HCL 0.4 MG/ML IJ SOLN
0.4000 mg | Freq: Once | INTRAMUSCULAR | Status: DC
  Filled 2019-10-11: qty 1

## 2019-10-11 MED ORDER — IOHEXOL 350 MG/ML SOLN
100.0000 mL | Freq: Once | INTRAVENOUS | Status: AC | PRN
  Administered 2019-10-11: 100 mL via INTRAVENOUS

## 2019-10-11 NOTE — Progress Notes (Signed)
Vonda Antigua, MD [address], [place], [place], [place], [address] [address], [place], [place], [place], [address] Phone: [phone]  Fax: [phone]   Subjective: Patient tolerating oral oral solid food diet.  No nausea or vomiting.  Continues to report abdominal pain.   Objective: Exam: Vital signs in last 24 hours: Vitals:   10/10/19 1925 10/10/19 1926 10/11/19 0540 10/11/19 1157  BP: (!) 157/63  (!) 148/50 (!) 188/70  Pulse: 72 74 66 76  Resp: 18  20 16   Temp: 98.1 F (36.7 C)  97.7 F (36.5 C) 97.9 F (36.6 C)  TempSrc: Oral  Oral Oral  SpO2: (!) 83% 91% 93% (!) 87%  Weight:      Height:       Weight change:   Intake/Output Summary (Last 24 hours) at 10/11/2019 1641 Last data filed at 10/11/2019 0500 Gross per 24 hour  Intake 381.97 ml  Output 0 ml  Net 381.97 ml    General: No acute distress, AAO x3 Abd: Soft, NT/ND, No HSM Skin: Warm, no rashes Neck: Supple, Trachea midline   Lab Results: Lab Results  Component Value Date   WBC 5.9 10/11/2019   HGB 8.2 (L) 10/11/2019   HCT 25.5 (L) 10/11/2019   MCV 89.5 10/11/2019   PLT 154 10/11/2019   Micro Results: Recent Results (from the past 240 hour(s))  SARS Coronavirus 2 by RT PCR (hospital order, performed in [place] hospital lab) Nasopharyngeal Nasopharyngeal Swab     Status: None   Collection Time: 10/08/19  2:29 PM   Specimen: Nasopharyngeal Swab  Result Value Ref Range Status   SARS Coronavirus 2 NEGATIVE NEGATIVE Final    Comment: (NOTE) SARS-CoV-2 target nucleic acids are NOT DETECTED.  The SARS-CoV-2 RNA is generally detectable in upper and lower respiratory specimens during the acute phase of infection. The lowest concentration of SARS-CoV-2 viral copies this assay can detect is 250 copies / mL. A negative result does not preclude SARS-CoV-2 infection and should not be used as the sole basis for treatment or other patient management decisions.  A negative result may occur  with improper specimen collection / handling, submission of specimen other than nasopharyngeal swab, presence of viral mutation(s) within the areas targeted by this assay, and inadequate number of viral copies (<250 copies / mL). A negative result must be combined with clinical observations, patient history, and epidemiological information.  Fact Sheet for Patients:   StrictlyIdeas.no  Fact Sheet for Healthcare Providers: BankingDealers.co.za  This test is not yet approved or  cleared by the [place] FDA and has been authorized for detection and/or diagnosis of SARS-CoV-2 by FDA under an Emergency Use Authorization (EUA).  This EUA will remain in effect (meaning this test can be used) for the duration of the COVID-19 declaration under Section 564(b)(1) of the Act, 21 U.S.C. section 360bbb-3(b)(1), unless the authorization is terminated or revoked sooner.  Performed at [facility], [address] [place], [place] [address]   Urine Culture     Status: Abnormal   Collection Time: 10/08/19  2:31 PM   Specimen: Urine, Random  Result Value Ref Range Status   Specimen Description   Final    URINE, RANDOM Performed at [facility], [address] [place], [place] [address]    Special Requests   Final    NONE Performed at [facility], [address] [place], [place] [address]    Culture >=100,000 COLONIES/mL ESCHERICHIA COLI (A)  Final  Report Status 10/10/2019 FINAL  Final   Organism ID, Bacteria ESCHERICHIA COLI (A)  Final      Susceptibility   Escherichia coli - MIC*    AMPICILLIN >=32 RESISTANT Resistant     CEFAZOLIN <=4 SENSITIVE Sensitive     CEFTRIAXONE <=0.25 SENSITIVE Sensitive     CIPROFLOXACIN INTERMEDIATE Intermediate     GENTAMICIN <=1 SENSITIVE Sensitive     IMIPENEM <=0.25 SENSITIVE Sensitive     NITROFURANTOIN <=16 SENSITIVE Sensitive     TRIMETH/SULFA >=320 RESISTANT  Resistant     AMPICILLIN/SULBACTAM 16 INTERMEDIATE Intermediate     PIP/TAZO <=4 SENSITIVE Sensitive     * >=100,000 COLONIES/mL ESCHERICHIA COLI  MRSA PCR Screening     Status: None   Collection Time: 10/09/19  2:46 AM   Specimen: Nasopharyngeal  Result Value Ref Range Status   MRSA by PCR NEGATIVE NEGATIVE Final    Comment:        The GeneXpert MRSA Assay (FDA approved for NASAL specimens only), is one component of a comprehensive MRSA colonization surveillance program. It is not intended to diagnose MRSA infection nor to guide or monitor treatment for MRSA infections. Performed at [facility], [address], [place], [place] [address]    Studies/Results: No results found. Medications:  Scheduled Meds: . amiodarone  200 mg Oral Daily  . apixaban  5 mg Oral BID  . vitamin C  500 mg Oral BID  . atorvastatin  40 mg Oral Daily  . Chlorhexidine Gluconate Cloth  6 each Topical Q0600  . feeding supplement  1 Container Oral TID BM  . FLUoxetine  20 mg Oral Daily  . folic acid  1 mg Oral Daily  . hydrALAZINE  10 mg Oral BID  . levothyroxine  88 mcg Oral Q0600  . multivitamin  1 tablet Oral QHS  . naLOXone (NARCAN)  injection  0.4 mg Intravenous Once  . pantoprazole (PROTONIX) IV  40 mg Intravenous Q12H  . sodium chloride flush  3 mL Intravenous Once  . traZODone  50 mg Oral QHS   Continuous Infusions: . sodium chloride    . sodium chloride    . cefTRIAXone (ROCEPHIN)  IV 1 g (10/11/19 1556)  . fluconazole (DIFLUCAN) IV 100 mg (10/11/19 1452)   PRN Meds:.sodium chloride, sodium chloride, acetaminophen, alteplase, fluticasone, heparin, lidocaine (PF), lidocaine-prilocaine, melatonin, naLOXone (NARCAN)  injection, nitroGLYCERIN, ondansetron **OR** ondansetron (ZOFRAN) IV, pentafluoroprop-tetrafluoroeth, promethazine   Assessment: Active Problems:   Anemia of chronic disease   Nausea   Paroxysmal A-fib (HCC)   Nausea vomiting and diarrhea   Epigastric  abdominal pain   Acute cystitis without hematuria    Plan: CTA pending today to evaluate for any ischemic source of pain  Follow-up CT results  Continue PPI   LOS: 3 days   Vonda Antigua, MD 10/11/2019, 4:41 PM

## 2019-10-11 NOTE — Progress Notes (Signed)
Rapid response was called due to patient was un-arousable. VS within parameters. MD came and assess pt. Narcan given per order. Ct. head was completed as well.

## 2019-10-11 NOTE — Progress Notes (Signed)
[name], female   DOB: 10-12-1955, 64 y.o.   MRN: [id number]  Called to a rapid response.  Patient was unable to be awakened.  She had a similar response when was given Dilaudid in the emergency room and was unresponsive.  She did have an oxycodone this morning.  I asked him to give 0.4 mg of Narcan.  There was no response to that.  I asked for another 0.4 mg of Narcan.  Initially there was no response to that.  Patient did have a delayed response and woke up after a couple minutes.  Discontinue pain medications.  It is Tylenol only for pain.  ABG reviewed and not retaining carbon dioxide CT scan of the head ordered  Case discussed with nephrology and rapid response team.   Dr Loletha Grayer

## 2019-10-11 NOTE — Progress Notes (Signed)
Pt. is now alert and oriented and trying to eat lunch at this time.

## 2019-10-11 NOTE — Progress Notes (Signed)
PT Cancellation Note  Patient Details Name: [name] MRN: [id number] DOB: 1955-04-05   Cancelled Treatment:    Reason Eval/Treat Not Completed: Patient at procedure or test/unavailable   Alanson Puls, PT DPT 10/11/2019, 9:59 AM

## 2019-10-11 NOTE — Plan of Care (Signed)
Pt was still complaining  of abdominal pain. Tylenol given .           Jamie Arias

## 2019-10-11 NOTE — TOC Initial Note (Signed)
Transition of Care [facility]) - Initial/Assessment Note    Patient Details  Name: Jamie Arias MRN: [id number] Date of Birth: [birth year]  [facility] CM/SW Contact:    Meriel Flavors, LCSW Phone Number: 10/11/2019, 3:44 PM  Clinical Narrative:                 ARIELLA VOIT is a 64 y.o. female who was admitted with abdominal pain. Patient from home with spouse, husband at bedside. Patient states she has transportation, doesn't have any difficulties obtaining her medications, has support with family. Patient intends to D/C home/self.        Patient Goals and CMS Choice  Patient wants to not be in pain anymore      Expected Discharge Plan and Services  Patient is still having abdominal pain and is not medically stable for discharge. Patient will d/c home/self                                              Prior Living Arrangements/Services                       Activities of Daily Living Home Assistive Devices/Equipment: Wheelchair, Hospital bed ADL Screening (condition at time of admission) Patient's cognitive ability adequate to safely complete daily activities?: Yes Is the patient deaf or have difficulty hearing?: No Does the patient have difficulty seeing, even when wearing glasses/contacts?: No Does the patient have difficulty concentrating, remembering, or making decisions?: No Patient able to express need for assistance with ADLs?: Yes Does the patient have difficulty dressing or bathing?: Yes Independently performs ADLs?: No Communication: Independent Dressing (OT): Dependent Is this a change from baseline?: Pre-admission baseline Grooming: Dependent Is this a change from baseline?: Pre-admission baseline Feeding: Needs assistance Is this a change from baseline?: Pre-admission baseline Bathing: Dependent Is this a change from baseline?: Pre-admission baseline Toileting: Dependent Is this a change from baseline?: Pre-admission  baseline In/Out Bed: Dependent Is this a change from baseline?: Pre-admission baseline Walks in Home: Dependent Is this a change from baseline?: Pre-admission baseline Does the patient have difficulty walking or climbing stairs?: Yes Weakness of Legs: Both Weakness of Arms/Hands: Both  Permission Sought/Granted                  Emotional Assessment              Admission diagnosis:  Generalized abdominal pain [R10.84] Hypoxia [R09.02] ESRD (end stage renal disease) ([place]) [N18.6] Nausea vomiting and diarrhea [R11.2, R19.7] Patient Active Problem List   Diagnosis Date Noted   Acute cystitis without hematuria    Epigastric abdominal pain    Abdominal pain    Lethargy    Mobility impaired 99/83/3825   Complication of vascular dialysis catheter 09/09/2019   History of 2019 novel coronavirus disease (COVID-19) 07/14/2019   Malaise and fatigue 07/14/2019   Chest pain 06/29/2019   Hypertensive urgency 06/29/2019   Aftercare including intermittent dialysis ([place]) 05/26/2019   Inappropriate diet and eating habits 04/30/2019   Gastro-esophageal reflux disease with esophagitis, without bleeding 04/22/2019   Pleural effusion, not elsewhere classified 04/22/2019   Pulmonary hypertension, unspecified ([place]) 04/22/2019   Palliative care by specialist    Goals of care, counseling/discussion    Abscess    Paroxysmal A-fib (HCC)    Nausea vomiting and diarrhea  Oropharyngeal dysphagia    Acute metabolic encephalopathy    Acute blood loss anemia    Arm DVT (deep venous thromboembolism), acute, left ([place]) 02/13/2019   Acute encephalopathy 02/13/2019   Decubitus ulcer of heel, bilateral, stage 2 ([place]) 02/10/2019   Sepsis due to methicillin resistant Staphylococcus aureus (MRSA) ([place]) 02/10/2019   MRSA bacteremia 02/10/2019   Altered level of consciousness 02/09/2019   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   Anemia in chronic kidney disease  01/17/2019   Coagulation defect, unspecified ([place]) 01/17/2019   Fever, unspecified 01/17/2019   Headache, unspecified 01/17/2019   Iron deficiency anemia, unspecified 01/17/2019   Moderate protein-calorie malnutrition ([place]) 01/17/2019   Other fluid overload 01/17/2019   Pain, unspecified 01/17/2019   Pruritus, unspecified 01/17/2019   Secondary hyperparathyroidism of renal origin ([place]) 01/17/2019   Shortness of breath 01/17/2019   Altered mental status, unspecified 09/19/2018   COVID-19 virus detected 09/19/2018   Adjustment disorder with depressed mood 08/31/2018   COVID-19 virus infection 08/24/2018   Acute on chronic respiratory failure with hypoxia ([place]) 08/24/2018   Allergic rhinitis 09/19/2017   Chronic neck pain 07/25/2017   Hyperkalemia 56/31/4970   Complication of vascular dialysis catheter, initial encounter 12/01/2016   ESRD (end stage renal disease) ([place]) 01/19/2016   Nausea    GI bleed 10/25/2015   Hypertensive heart disease 10/24/2015   Chronic diastolic CHF (congestive heart failure) ([place]) 10/24/2015   Unstable angina (HCC) 10/23/2015   Hypotension 26/37/8588   Chronic systolic CHF (congestive heart failure) ([place]) 09/02/2015   Depression 07/27/2015   Calculus of gallbladder with chronic cholecystitis without obstruction    Bilateral carotid bruits 11/30/2014   Low magnesium levels 08/10/2013   Anemia of chronic disease 08/10/2013   Constipation 08/10/2013   Atrial fibrillation, chronic ([place]) 07/30/2013   Coronary artery disease    CAD (coronary artery disease) 50/27/7412   Acute systolic heart failure ([place]) 06/30/2013   NSTEMI (non-ST elevated myocardial infarction) ([place]) 06/29/2013   Vertigo 08/25/2012   Sleep disorder 05/23/2012   Hypothyroid 04/27/2012   HTN (hypertension) 04/27/2012   HLD (hyperlipidemia) 04/27/2012   Type 2 diabetes mellitus with ESRD (end-stage renal disease) ([place]) 04/27/2012   Neuropathy  04/27/2012   PCP:  [name], MD Pharmacy:   [facility] [address], [place] - [place] [address] [place] [address] Phone: [phone] Fax: [phone]  [facility] [place], [place] - [address] [place] [address] Phone: [phone] Fax: [phone]     Social Determinants of Health (SDOH) Interventions    Readmission Risk Interventions Readmission Risk Prevention Plan 10/11/2019 10/10/2019 02/13/2019  Transportation Screening - Complete -  PCP or Specialist Appt within 3-5 Days - Complete -  HRI or [place] - Complete -  Social Work Consult for [place] Planning/Counseling - Complete -  Palliative Care Screening - Not Applicable -  Medication Review Press photographer) - Complete Complete  PCP or Specialist appointment within 3-5 days of discharge Complete - -  HRI or Home Care Consult Patient refused - Complete  SW Recovery Care/Counseling Consult Complete - Complete  Palliative Care Screening Not Applicable - -  Friend Not Applicable - Not Applicable  Some recent data might be hidden

## 2019-10-11 NOTE — Progress Notes (Signed)
[name], female   DOB: 02-26-1956, 64 y.o.   MRN: [id number] [facility] PROGRESS NOTE  Jamie Arias XBJ:[id number] DOB: 09-29-1955 DOA: 10/08/2019 PCP: [name], MD  HPI/Subjective: Patient still with epigastric abdominal pain.  Stated she could not eat yesterday.  Not feeling well at all.  Objective: Vitals:   10/11/19 0540 10/11/19 1157  BP: (!) 148/50 (!) 188/70  Pulse: 66 76  Resp: 20 16  Temp: 97.7 F (36.5 C) 97.9 F (36.6 C)  SpO2: 93% (!) 87%    Intake/Output Summary (Last 24 hours) at 10/11/2019 1227 Last data filed at 10/11/2019 0500 Gross per 24 hour  Intake 381.97 ml  Output 0 ml  Net 381.97 ml   Filed Weights   10/08/19 1059 10/10/19 0843 10/10/19 1300  Weight: 77.6 kg 29.4 kg 74.2 kg    ROS: Review of Systems  Respiratory: Negative for shortness of breath.   Cardiovascular: Negative for chest pain.  Gastrointestinal: Positive for abdominal pain and nausea.   Exam: Physical Exam HENT:     Nose: No mucosal edema.     Mouth/Throat:     Pharynx: No oropharyngeal exudate.  Eyes:     General: Lids are normal.     Conjunctiva/sclera: Conjunctivae normal.     Pupils: Pupils are equal, round, and reactive to light.  Cardiovascular:     Rate and Rhythm: Normal rate and regular rhythm.     Heart sounds: Normal heart sounds, S1 normal and S2 normal.  Pulmonary:     Breath sounds: No decreased breath sounds, wheezing, rhonchi or rales.  Abdominal:     Palpations: Abdomen is soft.     Tenderness: There is abdominal tenderness in the epigastric area.  Musculoskeletal:     Right ankle: No swelling.     Left ankle: No swelling.  Skin:    General: Skin is warm.     Findings: No rash.  Neurological:     Mental Status: She is alert.     Comments: Moves all extremities.       Data Reviewed: Basic Metabolic Panel: Recent Labs  Lab 10/08/19 1111 10/09/19 0431 10/09/19 0847 10/10/19 0703  NA 141 138  --  139  K 4.4  3.4*  --  3.3*  CL 94* 97*  --  98  CO2 29 30  --  26  GLUCOSE 192* 121*  --  128*  BUN 28* 14  --  11  CREATININE 5.46* 3.09*  --  2.53*  CALCIUM 9.5 8.6*  --  8.7*  PHOS  --   --  2.6  --    Liver Function Tests: Recent Labs  Lab 10/08/19 1111  AST 93*  ALT 17  ALKPHOS 50  BILITOT 2.2*  PROT 7.4  ALBUMIN 3.1*   Recent Labs  Lab 10/08/19 1111  LIPASE 17   No results for input(s): AMMONIA in the last 168 hours. CBC: Recent Labs  Lab 10/08/19 1111 10/09/19 0431 10/10/19 0703 10/11/19 0542  WBC 4.8 5.7 5.7 5.9  HGB 8.0* 7.9* 7.7* 8.2*  HCT 25.2* 24.2* 24.7* 25.5*  MCV 93.3 91.7 93.6 89.5  PLT 158 145* 148* 154   BNP (last 3 results) Recent Labs    03/01/19 0429  BNP 720.0*    CBG: Recent Labs  Lab 10/10/19 0853  GLUCAP 118*    Recent Results (from the past 240 hour(s))  SARS Coronavirus 2 by RT PCR (hospital order, performed in [facility] hospital  lab) Nasopharyngeal Nasopharyngeal Swab     Status: None   Collection Time: 10/08/19  2:29 PM   Specimen: Nasopharyngeal Swab  Result Value Ref Range Status   SARS Coronavirus 2 NEGATIVE NEGATIVE Final    Comment: (NOTE) SARS-CoV-2 target nucleic acids are NOT DETECTED.  The SARS-CoV-2 RNA is generally detectable in upper and lower respiratory specimens during the acute phase of infection. The lowest concentration of SARS-CoV-2 viral copies this assay can detect is 250 copies / mL. A negative result does not preclude SARS-CoV-2 infection and should not be used as the sole basis for treatment or other patient management decisions.  A negative result may occur with improper specimen collection / handling, submission of specimen other than nasopharyngeal swab, presence of viral mutation(s) within the areas targeted by this assay, and inadequate number of viral copies (<250 copies / mL). A negative result must be combined with clinical observations, patient history, and epidemiological information.  Fact  Sheet for Patients:   StrictlyIdeas.no  Fact Sheet for Healthcare Providers: BankingDealers.co.za  This test is not yet approved or  cleared by the [place] FDA and has been authorized for detection and/or diagnosis of SARS-CoV-2 by FDA under an Emergency Use Authorization (EUA).  This EUA will remain in effect (meaning this test can be used) for the duration of the COVID-19 declaration under Section 564(b)(1) of the Act, 21 U.S.C. section 360bbb-3(b)(1), unless the authorization is terminated or revoked sooner.  Performed at [facility], [address] [place], [place] [address]   Urine Culture     Status: Abnormal   Collection Time: 10/08/19  2:31 PM   Specimen: Urine, Random  Result Value Ref Range Status   Specimen Description   Final    URINE, RANDOM Performed at [facility], [address] [place], [place] [address]    Special Requests   Final    NONE Performed at [facility], [address] [place], [place] [address]    Culture >=100,000 COLONIES/mL ESCHERICHIA COLI (A)  Final   Report Status 10/10/2019 FINAL  Final   Organism ID, Bacteria ESCHERICHIA COLI (A)  Final      Susceptibility   Escherichia coli - MIC*    AMPICILLIN >=32 RESISTANT Resistant     CEFAZOLIN <=4 SENSITIVE Sensitive     CEFTRIAXONE <=0.25 SENSITIVE Sensitive     CIPROFLOXACIN INTERMEDIATE Intermediate     GENTAMICIN <=1 SENSITIVE Sensitive     IMIPENEM <=0.25 SENSITIVE Sensitive     NITROFURANTOIN <=16 SENSITIVE Sensitive     TRIMETH/SULFA >=320 RESISTANT Resistant     AMPICILLIN/SULBACTAM 16 INTERMEDIATE Intermediate     PIP/TAZO <=4 SENSITIVE Sensitive     * >=100,000 COLONIES/mL ESCHERICHIA COLI  MRSA PCR Screening     Status: None   Collection Time: 10/09/19  2:46 AM   Specimen: Nasopharyngeal  Result Value Ref Range Status   MRSA by PCR NEGATIVE NEGATIVE Final    Comment:        The GeneXpert MRSA  Assay (FDA approved for NASAL specimens only), is one component of a comprehensive MRSA colonization surveillance program. It is not intended to diagnose MRSA infection nor to guide or monitor treatment for MRSA infections. Performed at [facility], [address], [place], [place] [address]      Studies: No results found.  Scheduled Meds: . amiodarone  200 mg Oral Daily  . apixaban  5 mg Oral BID  . vitamin C  500 mg Oral BID  .  atorvastatin  40 mg Oral Daily  . Chlorhexidine Gluconate Cloth  6 each Topical Q0600  . feeding supplement  1 Container Oral TID BM  . FLUoxetine  20 mg Oral Daily  . folic acid  1 mg Oral Daily  . hydrALAZINE  10 mg Oral BID  . levothyroxine  88 mcg Oral Q0600  . multivitamin  1 tablet Oral QHS  . pantoprazole (PROTONIX) IV  40 mg Intravenous Q12H  . sodium chloride flush  3 mL Intravenous Once  . traZODone  50 mg Oral QHS   Continuous Infusions: . sodium chloride    . sodium chloride    . cefTRIAXone (ROCEPHIN)  IV 1 g (10/10/19 1817)  . fluconazole (DIFLUCAN) IV 100 mg (10/10/19 1457)    Assessment/Plan:  1. Epigastric abdominal pain nausea and not being able to eat much.  Endoscopy showed some erythema and whitish plaques in the esophagus.  Patient is on IV Protonix.  I added Diflucan just in case this is thrush.  We will get a CT angio to look at the vasculature just in case this is ischemic colitis.  Patient has not had any further diarrhea since coming in. 2. Acute cystitis with E. coli on Rocephin 3. Essential hypertension on hydralazine 4. End-stage renal disease on dialysis.  In speaking with nephrologist the patient has not been doing well since her Covid diagnosis last year. 5. Paroxysmal atrial fibrillation on amiodarone.  Restarted Eliquis. 6. Anemia of chronic disease.  Continue to monitor hemoglobin. 7. Hyperlipidemia unspecified on atorvastatin 8. I will get palliative care consultation because the patient has not  been eating well.    Code Status:     Code Status Orders  (From admission, onward)         Start     Ordered   10/08/19 1508  Full code  Continuous        10/08/19 1508        Code Status History    Date Active Date Inactive Code Status Order ID Comments User Context   09/09/2019 0359 09/10/2019 2011 Full Code [id number]  Athena Masse, MD ED   06/29/2019 2028 07/01/2019 0108 Full Code [id number]  Ivor Costa, MD Inpatient   02/21/2019 0932 04/21/2019 0155 DNR [id number]  Sidney Ace, MD Inpatient   02/09/2019 1755 02/21/2019 0932 Full Code [id number]  Thornell Mule, MD ED   08/24/2018 0022 09/15/2018 1720 Full Code [id number]  Shela Leff, MD Inpatient   09/04/2017 1510 09/06/2017 1920 Full Code [id number]  Epifanio Lesches, MD ED   02/15/2017 1243 02/16/2017 2023 Full Code [id number]  Saundra Shelling, MD Inpatient   10/23/2015 0828 10/23/2015 0908 Full Code [id number]  Saundra Shelling, MD Inpatient   09/03/2015 0130 09/03/2015 1619 Full Code [id number]  Lance Coon, MD Inpatient   07/02/2013 1327 07/14/2013 1540 Full Code [id number]  Nani Skillern, PA-C Inpatient   06/29/2013 2153 07/02/2013 1327 Full Code [id number]  Larey Dresser, MD Inpatient   Advance Care Planning Activity     Family Communication: Left message for husband on the phone Disposition Plan: Status is: Inpatient  Dispo: The patient is from: Home              Anticipated d/c is to: Home versus rehab.              Anticipated d/c date is: Patient will need more time here in the hospital because she has not eaten since coming into the hospital  Patient currently still with epigastric abdominal pain.  Getting a CT angio to look at the vasculature.  Patient will need to tolerate diet and move around more prior to disposition.  We will get palliative care consultation since the patient has not eaten much since coming into the  hospital.  Consultants:  Nephrology  Gastroenterology  Procedures:  EGD  Antibiotics:  Rocephin  Diflucan  Time spent: 27 minutes  [place]

## 2019-10-11 NOTE — Progress Notes (Signed)
[facility]  ROUNDING NOTE   Subjective:   Events of today were reviewed. Naloxone required due to altered mental status.  Husband at bedside.  CT abd/pelvis negative.   Patient is not eating.   Objective:  Vital signs in last 24 hours:  Temp:  [97.7 F (36.5 C)-98.1 F (36.7 C)] 97.9 F (36.6 C) (08/08 1157) Pulse Rate:  [66-76] 76 (08/08 1157) Resp:  [16-20] 16 (08/08 1157) BP: (148-188)/(50-70) 188/70 (08/08 1157) SpO2:  [83 %-93 %] 87 % (08/08 1157)  Weight change:  Filed Weights   10/08/19 1059 10/10/19 0843 10/10/19 1300  Weight: 77.6 kg 29.4 kg 74.2 kg    Intake/Output: I/O last 3 completed shifts: In: 51 [P.O.:240; I.V.:100; IV Piggyback:142] Out: 0    Intake/Output this shift:  No intake/output data recorded.  Physical Exam: General: NAD   Head: Normocephalic, atraumatic. Moist oral mucosal membranes  Eyes: Anicteric, PERRL  Neck: Supple, trachea midline  Lungs:  Diminished at bases.    Heart: Regular rate and rhythm  Abdomen:  Soft, nontender,   Extremities:  No peripheral edema.  Neurologic: Nonfocal, moving all four extremities  Skin: No lesions  Access: Left AVG     Basic Metabolic Panel: Recent Labs  Lab 10/08/19 1111 10/09/19 0431 10/09/19 0847 10/10/19 0703  NA 141 138  --  139  K 4.4 3.4*  --  3.3*  CL 94* 97*  --  98  CO2 29 30  --  26  GLUCOSE 192* 121*  --  128*  BUN 28* 14  --  11  CREATININE 5.46* 3.09*  --  2.53*  CALCIUM 9.5 8.6*  --  8.7*  PHOS  --   --  2.6  --     Liver Function Tests: Recent Labs  Lab 10/08/19 1111  AST 93*  ALT 17  ALKPHOS 50  BILITOT 2.2*  PROT 7.4  ALBUMIN 3.1*   Recent Labs  Lab 10/08/19 1111  LIPASE 17   No results for input(s): AMMONIA in the last 168 hours.  CBC: Recent Labs  Lab 10/08/19 1111 10/09/19 0431 10/10/19 0703 10/11/19 0542  WBC 4.8 5.7 5.7 5.9  HGB 8.0* 7.9* 7.7* 8.2*  HCT 25.2* 24.2* 24.7* 25.5*  MCV 93.3 91.7 93.6 89.5  PLT 158 145* 148* 154     Cardiac Enzymes: No results for input(s): CKTOTAL, CKMB, CKMBINDEX, TROPONINI in the last 168 hours.  BNP: Invalid input(s): POCBNP  CBG: Recent Labs  Lab 10/10/19 0853 10/11/19 1251  GLUCAP 118* 114*    Microbiology: Results for orders placed or performed during the hospital encounter of 10/08/19  SARS Coronavirus 2 by RT PCR (hospital order, performed in [facility] hospital lab) Nasopharyngeal Nasopharyngeal Swab     Status: None   Collection Time: 10/08/19  2:29 PM   Specimen: Nasopharyngeal Swab  Result Value Ref Range Status   SARS Coronavirus 2 NEGATIVE NEGATIVE Final    Comment: (NOTE) SARS-CoV-2 target nucleic acids are NOT DETECTED.  The SARS-CoV-2 RNA is generally detectable in upper and lower respiratory specimens during the acute phase of infection. The lowest concentration of SARS-CoV-2 viral copies this assay can detect is 250 copies / mL. A negative result does not preclude SARS-CoV-2 infection and should not be used as the sole basis for treatment or other patient management decisions.  A negative result may occur with improper specimen collection / handling, submission of specimen other than nasopharyngeal swab, presence of viral mutation(s) within the areas targeted by  this assay, and inadequate number of viral copies (<250 copies / mL). A negative result must be combined with clinical observations, patient history, and epidemiological information.  Fact Sheet for Patients:   StrictlyIdeas.no  Fact Sheet for Healthcare Providers: BankingDealers.co.za  This test is not yet approved or  cleared by the [place] FDA and has been authorized for detection and/or diagnosis of SARS-CoV-2 by FDA under an Emergency Use Authorization (EUA).  This EUA will remain in effect (meaning this test can be used) for the duration of the COVID-19 declaration under Section 564(b)(1) of the Act, 21 U.S.C. section  360bbb-3(b)(1), unless the authorization is terminated or revoked sooner.  Performed at [facility], [address] [place], [place] [address]   Urine Culture     Status: Abnormal   Collection Time: 10/08/19  2:31 PM   Specimen: Urine, Random  Result Value Ref Range Status   Specimen Description   Final    URINE, RANDOM Performed at [facility], [address] [place], [place] [address]    Special Requests   Final    NONE Performed at [facility], [address] [place], [place] [address]    Culture >=100,000 COLONIES/mL ESCHERICHIA COLI (A)  Final   Report Status 10/10/2019 FINAL  Final   Organism ID, Bacteria ESCHERICHIA COLI (A)  Final      Susceptibility   Escherichia coli - MIC*    AMPICILLIN >=32 RESISTANT Resistant     CEFAZOLIN <=4 SENSITIVE Sensitive     CEFTRIAXONE <=0.25 SENSITIVE Sensitive     CIPROFLOXACIN INTERMEDIATE Intermediate     GENTAMICIN <=1 SENSITIVE Sensitive     IMIPENEM <=0.25 SENSITIVE Sensitive     NITROFURANTOIN <=16 SENSITIVE Sensitive     TRIMETH/SULFA >=320 RESISTANT Resistant     AMPICILLIN/SULBACTAM 16 INTERMEDIATE Intermediate     PIP/TAZO <=4 SENSITIVE Sensitive     * >=100,000 COLONIES/mL ESCHERICHIA COLI  MRSA PCR Screening     Status: None   Collection Time: 10/09/19  2:46 AM   Specimen: Nasopharyngeal  Result Value Ref Range Status   MRSA by PCR NEGATIVE NEGATIVE Final    Comment:        The GeneXpert MRSA Assay (FDA approved for NASAL specimens only), is one component of a comprehensive MRSA colonization surveillance program. It is not intended to diagnose MRSA infection nor to guide or monitor treatment for MRSA infections. Performed at [facility], [address] [place], [place] [address]     Coagulation Studies: No results for input(s): LABPROT, INR in the last 72 hours.  Urinalysis: Recent Labs    10/08/19 1431  COLORURINE YELLOW*  LABSPEC 1.010  PHURINE 7.0   GLUCOSEU NEGATIVE  HGBUR SMALL*  BILIRUBINUR NEGATIVE  KETONESUR 5*  PROTEINUR >=300*  NITRITE NEGATIVE  LEUKOCYTESUR MODERATE*      Imaging: No results found.   Medications:   . sodium chloride    . sodium chloride    . cefTRIAXone (ROCEPHIN)  IV 1 g (10/10/19 1817)  . fluconazole (DIFLUCAN) IV 100 mg (10/10/19 1457)   . amiodarone  200 mg Oral Daily  . apixaban  5 mg Oral BID  . vitamin C  500 mg Oral BID  . atorvastatin  40 mg Oral Daily  . Chlorhexidine Gluconate Cloth  6 each Topical Q0600  . feeding supplement  1 Container Oral TID BM  . FLUoxetine  20 mg Oral Daily  . folic acid  1 mg Oral Daily  .  hydrALAZINE  10 mg Oral BID  . levothyroxine  88 mcg Oral Q0600  . multivitamin  1 tablet Oral QHS  . naLOXone (NARCAN)  injection  0.4 mg Intravenous Once  . pantoprazole (PROTONIX) IV  40 mg Intravenous Q12H  . sodium chloride flush  3 mL Intravenous Once  . traZODone  50 mg Oral QHS   sodium chloride, sodium chloride, acetaminophen, alteplase, fluticasone, heparin, lidocaine (PF), lidocaine-prilocaine, melatonin, naLOXone (NARCAN)  injection, nitroGLYCERIN, ondansetron **OR** ondansetron (ZOFRAN) IV, pentafluoroprop-tetrafluoroeth, promethazine  Assessment/ Plan:  Jamie Arias is a 64 y.o.  female with end stage renal disease on hemodialysis, hypertension, pulmonary hypertension, hypothroidism, diabetes mellitus type II, diabetic neuropathy, Hyperlipidemia,  congestive heart failure  was admitted on  10/08/2019 for Generalized abdominal pain [R10.84] Hypoxia [R09.02] ESRD (end stage renal disease) ([place]) [N18.6] Nausea vomiting and diarrhea [R11.2, R19.7]  [facility] MWF [address] [address] Left AVG 78.5 kg  1. ESRD on HD  - Hemodialysis for tomorrow - Consult vascular for removal of permcath  2. Anemia in CKD: hemoglobin 8.2 - ESA with HD treatments.   3. Hypertension: continues to be elevated. 188/70. Not currently taking blood pressure medications at home.  Started on hydralazine 10mg  bid PO this admission.   4. Secondary Hyperparathyroidism: PTH 83 on this admission. Phosphorus low at 2.6. Holding all phosphorus binders and vitamin D agents.    LOS: 3 Roslin Norwood 8/8/20212:07 PM

## 2019-10-11 NOTE — Significant Event (Addendum)
Rapid Response Event Note   Reason for Call:  - Altered Mental Status  Initial Focused Assessment:   - Patient is responsive to pain only. VS unremarkable. NAD.  Interventions:   - 0.4 mg of IV narcan given X 2.  Plan of Care:   - Remain on 2C for now. - Pain meds discontinued. - CTH   Event Summary:   MD Notified:   -Dr. Leslye Peer  Call Time:  1259 hrs  Arrival Time:  1250 hrs  End Time:  1312 hrs  Jesse Sans, RN     Reassessment:  Spoke with patient's RN at approximately 1400 hrs. Patient remains stable. No further needs at this time.

## 2019-10-12 ENCOUNTER — Inpatient Hospital Stay: Payer: Medicare Other

## 2019-10-12 ENCOUNTER — Encounter: Payer: Self-pay | Admitting: Gastroenterology

## 2019-10-12 DIAGNOSIS — R1084 Generalized abdominal pain: Secondary | ICD-10-CM

## 2019-10-12 DIAGNOSIS — I4581 Long QT syndrome: Secondary | ICD-10-CM

## 2019-10-12 DIAGNOSIS — R0902 Hypoxemia: Secondary | ICD-10-CM

## 2019-10-12 LAB — COMPREHENSIVE METABOLIC PANEL
ALT: 14 U/L (ref 0–44)
AST: 55 U/L — ABNORMAL HIGH (ref 15–41)
Albumin: 2.5 g/dL — ABNORMAL LOW (ref 3.5–5.0)
Alkaline Phosphatase: 41 U/L (ref 38–126)
Anion gap: 10 (ref 5–15)
BUN: 22 mg/dL (ref 8–23)
CO2: 29 mmol/L (ref 22–32)
Calcium: 8.3 mg/dL — ABNORMAL LOW (ref 8.9–10.3)
Chloride: 98 mmol/L (ref 98–111)
Creatinine, Ser: 4.55 mg/dL — ABNORMAL HIGH (ref 0.44–1.00)
GFR calc Af Amer: 11 mL/min — ABNORMAL LOW (ref >60)
GFR calc non Af Amer: 9 mL/min — ABNORMAL LOW (ref >60)
Glucose, Bld: 85 mg/dL (ref 70–99)
Potassium: 3.1 mmol/L — ABNORMAL LOW (ref 3.5–5.1)
Sodium: 137 mmol/L (ref 135–145)
Total Bilirubin: 1.1 mg/dL (ref 0.3–1.2)
Total Protein: 5.5 g/dL — ABNORMAL LOW (ref 6.5–8.1)

## 2019-10-12 LAB — CBC
HCT: 24.7 % — ABNORMAL LOW (ref 36.0–46.0)
Hemoglobin: 7.9 g/dL — ABNORMAL LOW (ref 12.0–15.0)
MCH: 29.2 pg (ref 26.0–34.0)
MCHC: 32 g/dL (ref 30.0–36.0)
MCV: 91.1 fL (ref 80.0–100.0)
Platelets: 146 10*3/uL — ABNORMAL LOW (ref 150–400)
RBC: 2.71 MIL/uL — ABNORMAL LOW (ref 3.87–5.11)
RDW: 22.5 % — ABNORMAL HIGH (ref 11.5–15.5)
WBC: 6.7 10*3/uL (ref 4.0–10.5)
nRBC: 0.7 % — ABNORMAL HIGH (ref 0.0–0.2)

## 2019-10-12 LAB — GLUCOSE, CAPILLARY: Glucose-Capillary: 74 mg/dL (ref 70–99)

## 2019-10-12 LAB — LIPASE, BLOOD: Lipase: 170 U/L — ABNORMAL HIGH (ref 11–51)

## 2019-10-12 MED ORDER — MELOXICAM 7.5 MG PO TABS
7.5000 mg | ORAL_TABLET | Freq: Once | ORAL | Status: AC
  Administered 2019-10-12: 7.5 mg via ORAL
  Filled 2019-10-12: qty 1

## 2019-10-12 MED ORDER — OXYMETAZOLINE HCL 0.05 % NA SOLN
4.0000 | Freq: Once | NASAL | Status: AC
  Administered 2019-10-12: 4 via NASAL
  Filled 2019-10-12: qty 15

## 2019-10-12 MED ORDER — EPOETIN ALFA 10000 UNIT/ML IJ SOLN: 10000.0000 [IU] | INTRAMUSCULAR | Status: DC

## 2019-10-12 MED ORDER — NALOXONE HCL 0.4 MG/ML IJ SOLN
0.4000 mg | Freq: Once | INTRAMUSCULAR | Status: AC
  Administered 2019-10-12: 0.4 mg via INTRAVENOUS
  Filled 2019-10-12: qty 1

## 2019-10-12 NOTE — Significant Event (Signed)
This nurse called a rapid response because patient was not responsive and her oxygen dropped to 80's. Placed on O2 for oxygen to come back up. Dr. Leslye Peer paged to come take a look at the patient. Orders placed for narcan to be given as well as for patient to get an MRI. Patient woke up more after narcan given. Will answer simple questions and name. Will continue to monitor.

## 2019-10-12 NOTE — Progress Notes (Signed)
[name], female   DOB: [birth year], 63 y.o.   MRN: [id number]  Rapid response called.  I came in and sternal rub there she opened her eyes looked at me and then closed her eyes again.  We will give another dose of Narcan today and see if that makes a difference.  Spoke with husband outside the room that the patient has not eaten since she is coming to the hospital which is a poor prognostic sign.  He still wishes the patient to be a full code.  Palliative care consultation MRI of the brain.  Dr Leslye Peer

## 2019-10-12 NOTE — Evaluation (Addendum)
Physical Therapy Evaluation Patient Details Name: LEONETTE TISCHER MRN: [id number] DOB: 12-28-1955 Today's Date: 10/12/2019   History of Present Illness  Patient is a 64 year old female with epigastric abdominal pain nausea and not being able to eat much, acute cystitis with E. coli, essential hypertension, ESRD on hemodialysis, Paroxysmal atrial fibrillation, anemia.   Rapid response called on 8/8 as patient had AMS/unable to be awakened, given Narcan. CT of head report no acute abnormality. Past medical history of DVT LUE, CAD s/p CABG, COVID positive 2020, CHF.   Clinical Impression  PT evaluation completed this date. Patient is lethargic but wakes up with verbal stimulus. Patient reports she does not ambulate at baseline, and her spouse assist with transfers to the wheelchair at baseline. Patient required Mod A- Max A for bed mobility, including rolling and sitting upright. Patient sat up on the edge of bed for less than 2 minutes and requested to lay back down with activity tolerance limited by nausea. Patient reports she gets motion sickness at baseline. Fair sitting balance with stand by assistance provided for safety. Recommend PT to address functional limitations listed below to maximize independence and return to PLOF. SNF recommended at this time.     Follow Up Recommendations SNF    Equipment Recommendations   (to be determined at next level of care )    Recommendations for Other Services       Precautions / Restrictions Precautions Precautions: Fall Restrictions Weight Bearing Restrictions: No      Mobility  Bed Mobility Overal bed mobility: Needs Assistance Bed Mobility: Supine to Sit;Sit to Supine;Rolling Rolling: Mod assist   Supine to sit: Max assist Sit to supine: Mod assist   General bed mobility comments: assistance for trunk and BLE for supine to short sitting. Mod A for LE support for sitting to supine. verbal cues for task initiation and sequencing    Transfers                 General transfer comment: patient declined attempting transfers at this time due to nausea with upright activity   Ambulation/Gait                Stairs            Wheelchair Mobility    Modified Rankin (Stroke Patients Only)       Balance Overall balance assessment: Needs assistance Sitting-balance support: Bilateral upper extremity supported Sitting balance-Leahy Scale: Fair Sitting balance - Comments: no gross loss of balance in sitting position. SBA for safety                                      Pertinent Vitals/Pain Pain Assessment: No/denies pain    Home Living Family/patient expects to be discharged to:: Private residence Living Arrangements: Spouse/significant other Available Help at Discharge: Family;Available 24 hours/day Type of Home: Mobile home Home Access: Ramped entrance     Home Layout: One level Home Equipment: Wheelchair - manual      Prior Function Level of Independence: Needs assistance   Gait / Transfers Assistance Needed: patient reports she does not ambualte at baseline and needs assistance for transfers to wheelchair from spouse   ADL's / Homemaking Assistance Needed: patient is a poor historian and lethargic during session. presumably patient needs assistance for some ADLs at baseline         Hand Dominance  Extremity/Trunk Assessment   Upper Extremity Assessment Upper Extremity Assessment: Generalized weakness    Lower Extremity Assessment Lower Extremity Assessment: RLE deficits/detail;LLE deficits/detail;Generalized weakness RLE Deficits / Details: PROM ankle to neutral. generalized weakness throughout. patient unable to follow commands for formal MMT  LLE Deficits / Details: patient unable to follow commands for formal MMT. generalized weakness throughout        Communication   Communication: No difficulties  Cognition Arousal/Alertness:  Lethargic Behavior During Therapy: WFL for tasks assessed/performed Overall Cognitive Status: No family/caregiver present to determine baseline cognitive functioning Area of Impairment: Following commands;Orientation                 Orientation Level:  (oriented to person )     Following Commands: Follows one step commands with increased time       General Comments: patient lethargic and eyes closed during part of session. patient needs verbal stimulation to remain awake and particiapte with therapy       General Comments      Exercises General Exercises - Lower Extremity Ankle Circles/Pumps: AAROM;Strengthening;Both;5 reps Heel Slides: AAROM;Strengthening;Both;5 reps;Supine Hip ABduction/ADduction: AAROM;Strengthening;Both;5 reps;Supine   Assessment/Plan    PT Assessment Patient needs continued PT services  PT Problem List Decreased strength;Decreased activity tolerance;Decreased balance;Decreased mobility;Decreased cognition;Decreased safety awareness       PT Treatment Interventions DME instruction;Gait training;Stair training;Functional mobility training;Therapeutic activities;Therapeutic exercise;Neuromuscular re-education;Balance training;Patient/family education    PT Goals (Current goals can be found in the Care Plan section)  Acute Rehab PT Goals Patient Stated Goal: patient unable to state  PT Goal Formulation: Patient unable to participate in goal setting Time For Goal Achievement: 10/26/19 Potential to Achieve Goals: Fair    Frequency Min 2X/week   Barriers to discharge        Co-evaluation               AM-PAC PT "6 Clicks" Mobility  Outcome Measure Help needed turning from your back to your side while in a flat bed without using bedrails?: A Lot Help needed moving from lying on your back to sitting on the side of a flat bed without using bedrails?: A Lot Help needed moving to and from a bed to a chair (including a wheelchair)?:  Total Help needed standing up from a chair using your arms (e.g., wheelchair or bedside chair)?: Total Help needed to walk in hospital room?: Total Help needed climbing 3-5 steps with a railing? : Total 6 Click Score: 8    End of Session   Activity Tolerance: Patient limited by lethargy (limited by nausea ) Patient left: in bed;with call bell/phone within reach (patient being taken out of room for dialysis ) Nurse Communication: Mobility status PT Visit Diagnosis: Muscle weakness (generalized) (M62.81);Unsteadiness on feet (R26.81)    Time: 0175-1025 PT Time Calculation (min) (ACUTE ONLY): 14 min   Charges:   PT Evaluation $PT Eval Moderate Complexity: 1 Mod         Minna Merritts, PT, MPT   Percell Locus 10/12/2019, 9:34 AM

## 2019-10-12 NOTE — Significant Event (Signed)
Rapid Response Event Note   Reason for Call : patient unresponsive.    Initial Focused Assessment:  Patient hemodynamically stable.  HR 62 BP 130/71 02 96% on room air Blood glucose 74      Interventions: Dr. Earleen Newport paged   Plan of Care:  Dr. Earleen Newport at bedside to speak with family. No new orders obtained at this time. Patient to remain on 2C    Event Summary:   MD Notified: Dr. Earleen [place] Call Time:12:48 Arrival Time:12:52 End Time:13:00  Silver Huguenin, RN

## 2019-10-12 NOTE — Progress Notes (Signed)
Ch arrived at room in response to Rapid Response page. Pt was unresponsive with full vitals, and care team figuring out helping Pt wake up. Pt's husband present bedside. Ch was present for a while, and left after Pt spoke to [place] [address] a little "Leave me alone." "I am cold," "I am tired," Pt acted like she was upset with husband. Ch prayed with them both. [name] was appreciative of visit. At the end of the prayer, Pt was asking "Where am I?"

## 2019-10-12 NOTE — Progress Notes (Signed)
[facility]  ROUNDING NOTE   Subjective:   Seen and examined on hemodialysis treatment.  Patient states her abdominal pain has improved and she is interested in eating today.     HEMODIALYSIS FLOWSHEET:  Blood Flow Rate (mL/min): 200 mL/min Arterial Pressure (mmHg): -30 mmHg Venous Pressure (mmHg): 60 mmHg Transmembrane Pressure (mmHg): 30 mmHg Ultrafiltration Rate (mL/min): 340 mL/min Dialysate Flow Rate (mL/min): 600 ml/min Conductivity: Machine : 13.8 Conductivity: Machine : 13.8 Dialysis Fluid Bolus: Normal Saline Bolus Amount (mL): 250 mL    Objective:  Vital signs in last 24 hours:  Temp:  [97.6 F (36.4 C)-98.2 F (36.8 C)] 97.6 F (36.4 C) (08/09 1336) Pulse Rate:  [59-75] 61 (08/09 1336) Resp:  [11-18] 15 (08/09 1336) BP: (127-189)/(42-80) 127/42 (08/09 1336) SpO2:  [91 %-99 %] 91 % (08/09 1336)  Weight change:  Filed Weights   10/08/19 1059 10/10/19 0843 10/10/19 1300  Weight: 77.6 kg 29.4 kg 74.2 kg    Intake/Output: I/O last 3 completed shifts: In: 382 [P.O.:240; IV Piggyback:142] Out: 0    Intake/Output this shift:  Total I/O In: -  Out: 500 [Other:500]  Physical Exam: General: NAD , laying in bed  Head: Normocephalic, atraumatic. Moist oral mucosal membranes  Eyes: Anicteric, PERRL  Neck: Supple, trachea midline  Lungs:  Diminished at bases.    Heart: Regular rate and rhythm  Abdomen:  +tender on epigastrum  Extremities:  No peripheral edema.  Neurologic: Nonfocal, moving all four extremities  Skin: No lesions  Access: Left AVG , RIJ permcath    Basic Metabolic Panel: Recent Labs  Lab 10/08/19 1111 10/08/19 1111 10/09/19 0431 10/09/19 0847 10/10/19 0703 10/12/19 0410  NA 141  --  138  --  139 137  K 4.4  --  3.4*  --  3.3* 3.1*  CL 94*  --  97*  --  98 98  CO2 29  --  30  --  26 29  GLUCOSE 192*  --  121*  --  128* 85  BUN 28*  --  14  --  11 22  CREATININE 5.46*  --  3.09*  --  2.53* 4.55*  CALCIUM 9.5   < >  8.6*  --  8.7* 8.3*  PHOS  --   --   --  2.6  --   --    < > = values in this interval not displayed.    Liver Function Tests: Recent Labs  Lab 10/08/19 1111 10/12/19 0410  AST 93* 55*  ALT 17 14  ALKPHOS 50 41  BILITOT 2.2* 1.1  PROT 7.4 5.5*  ALBUMIN 3.1* 2.5*   Recent Labs  Lab 10/08/19 1111 10/12/19 0410  LIPASE 17 170*   No results for input(s): AMMONIA in the last 168 hours.  CBC: Recent Labs  Lab 10/08/19 1111 10/09/19 0431 10/10/19 0703 10/11/19 0542 10/12/19 0410  WBC 4.8 5.7 5.7 5.9 6.7  HGB 8.0* 7.9* 7.7* 8.2* 7.9*  HCT 25.2* 24.2* 24.7* 25.5* 24.7*  MCV 93.3 91.7 93.6 89.5 91.1  PLT 158 145* 148* 154 146*    Cardiac Enzymes: No results for input(s): CKTOTAL, CKMB, CKMBINDEX, TROPONINI in the last 168 hours.  BNP: Invalid input(s): POCBNP  CBG: Recent Labs  Lab 10/10/19 0853 10/11/19 1251  GLUCAP 118* 114*    Microbiology: Results for orders placed or performed during the hospital encounter of 10/08/19  SARS Coronavirus 2 by RT PCR (hospital order, performed in [facility] hospital lab) Nasopharyngeal Nasopharyngeal  Swab     Status: None   Collection Time: 10/08/19  2:29 PM   Specimen: Nasopharyngeal Swab  Result Value Ref Range Status   SARS Coronavirus 2 NEGATIVE NEGATIVE Final    Comment: (NOTE) SARS-CoV-2 target nucleic acids are NOT DETECTED.  The SARS-CoV-2 RNA is generally detectable in upper and lower respiratory specimens during the acute phase of infection. The lowest concentration of SARS-CoV-2 viral copies this assay can detect is 250 copies / mL. A negative result does not preclude SARS-CoV-2 infection and should not be used as the sole basis for treatment or other patient management decisions.  A negative result may occur with improper specimen collection / handling, submission of specimen other than nasopharyngeal swab, presence of viral mutation(s) within the areas targeted by this assay, and inadequate number of viral  copies (<250 copies / mL). A negative result must be combined with clinical observations, patient history, and epidemiological information.  Fact Sheet for Patients:   StrictlyIdeas.no  Fact Sheet for Healthcare Providers: BankingDealers.co.za  This test is not yet approved or  cleared by the [place] FDA and has been authorized for detection and/or diagnosis of SARS-CoV-2 by FDA under an Emergency Use Authorization (EUA).  This EUA will remain in effect (meaning this test can be used) for the duration of the COVID-19 declaration under Section 564(b)(1) of the Act, 21 U.S.C. section 360bbb-3(b)(1), unless the authorization is terminated or revoked sooner.  Performed at [facility], [address] [place], [place] [address]   Urine Culture     Status: Abnormal   Collection Time: 10/08/19  2:31 PM   Specimen: Urine, Random  Result Value Ref Range Status   Specimen Description   Final    URINE, RANDOM Performed at [facility], [address] [place], [place] [address]    Special Requests   Final    NONE Performed at [facility], [address] [place], [place] [address]    Culture >=100,000 COLONIES/mL ESCHERICHIA COLI (A)  Final   Report Status 10/10/2019 FINAL  Final   Organism ID, Bacteria ESCHERICHIA COLI (A)  Final      Susceptibility   Escherichia coli - MIC*    AMPICILLIN >=32 RESISTANT Resistant     CEFAZOLIN <=4 SENSITIVE Sensitive     CEFTRIAXONE <=0.25 SENSITIVE Sensitive     CIPROFLOXACIN INTERMEDIATE Intermediate     GENTAMICIN <=1 SENSITIVE Sensitive     IMIPENEM <=0.25 SENSITIVE Sensitive     NITROFURANTOIN <=16 SENSITIVE Sensitive     TRIMETH/SULFA >=320 RESISTANT Resistant     AMPICILLIN/SULBACTAM 16 INTERMEDIATE Intermediate     PIP/TAZO <=4 SENSITIVE Sensitive     * >=100,000 COLONIES/mL ESCHERICHIA COLI  MRSA PCR Screening     Status: None   Collection Time:  10/09/19  2:46 AM   Specimen: Nasopharyngeal  Result Value Ref Range Status   MRSA by PCR NEGATIVE NEGATIVE Final    Comment:        The GeneXpert MRSA Assay (FDA approved for NASAL specimens only), is one component of a comprehensive MRSA colonization surveillance program. It is not intended to diagnose MRSA infection nor to guide or monitor treatment for MRSA infections. Performed at [facility], [address] [place], [place] [address]     Coagulation Studies: No results for input(s): LABPROT, INR in the last 72 hours.  Urinalysis: No results for input(s): COLORURINE, LABSPEC, PHURINE, GLUCOSEU, HGBUR, BILIRUBINUR, KETONESUR, PROTEINUR, UROBILINOGEN, NITRITE, LEUKOCYTESUR in the last 72 hours.  Invalid input(s): APPERANCEUR    Imaging: CT HEAD WO CONTRAST  Result Date: 10/11/2019 CLINICAL DATA:  Mental status change unknown cause EXAM: CT HEAD WITHOUT CONTRAST TECHNIQUE: Contiguous axial images were obtained from the base of the skull through the vertex without intravenous contrast. COMPARISON:  MRI head 03/06/2019 FINDINGS: Brain: Mild generalized atrophy. Negative for hydrocephalus. Patchy hypodensity throughout the cerebral white matter bilaterally and also in the pons similar to the prior MRI. Negative for acute infarct, hemorrhage, mass Vascular: Diffuse vascular enhancement due to CTA performed earlier today. Skull: Negative Sinuses/Orbits: Paranasal sinuses clear. Bilateral cataract extraction. Other: None IMPRESSION: Atrophy and chronic ischemic change in the pons and white matter. No acute abnormality. Electronically Signed   By: Franchot Gallo M.D.   On: 10/11/2019 13:50   CT Angio Abd/Pel w/ and/or w/o  Result Date: 10/11/2019 CLINICAL DATA:  64 year old with left upper quadrant abdominal pain. EXAM: CT ANGIOGRAPHY ABDOMEN AND PELVIS WITH CONTRAST AND WITHOUT CONTRAST TECHNIQUE: Multidetector CT imaging of the abdomen and pelvis was performed using the  standard protocol during bolus administration of intravenous contrast. Multiplanar reconstructed images and MIPs were obtained and reviewed to evaluate the vascular anatomy. CONTRAST:  130mL OMNIPAQUE IOHEXOL 350 MG/ML SOLN COMPARISON:  CT abdomen and pelvis 10/08/2019 FINDINGS: VASCULAR Aorta: Atherosclerotic disease in the abdominal aorta without aneurysm or dissection. No aortic stenosis. Celiac: Celiac trunk is widely patent without aneurysm, dissection or significant stenosis. Diffuse atherosclerotic disease in the splenic artery. There may be a small splenic artery aneurysm near the hilum which is calcified measuring roughly 8 mm. SMA: Patent without evidence of aneurysm, dissection, vasculitis or significant stenosis. Incidentally, there is a replaced right hepatic artery. Renals: Atherosclerotic plaque involving the proximal left renal artery without significant stenosis. Right renal artery is widely patent without significant stenosis. No evidence for aneurysm or dissection involving the renal arteries. IMA: Patent Inflow: Mild atherosclerotic disease involving the bilateral iliac arteries. Common, internal and external iliac arteries are patent bilaterally. No evidence for aneurysm, dissection or significant stenosis in the iliac arteries. Proximal Outflow: Proximal femoral arteries are patent bilaterally. Veins: Portal venous system is patent. No gross abnormality to the iliac veins or IVC. There is contrast refluxing into the hepatic veins which can be associated with increased right heart pressures. Review of the MIP images confirms the above findings. NON-VASCULAR Lower chest: Right pleural effusion has slightly enlarged in size. Compressive atelectasis in both lower lobes particularly on the left side. The volume loss in left lower lobe is similar to the previous examination. Hepatobiliary: Cholecystectomy. Again noted is mild dilatation of the biliary system. Common bile duct measures roughly 9 mm  and stable. Mild biliary dilatation may be secondary to the cholecystectomy. No discrete liver lesion. Pancreas: Minimal stranding or fluid between the duodenum and the pancreas on sequence 6 image 64. Subtle edema around the pancreatic head and uncinate process on sequence 6, image 84 is probably new. No significant pancreatic duct dilatation. Spleen: Again noted is a small amount of perisplenic ascites. Adrenals/Urinary Tract: Normal appearance of the adrenal glands. Again noted are bilateral atrophic kidneys and compatible with history of end-stage renal disease. No hydronephrosis and no suspicious renal lesion. Again noted is diffuse wall thickening in the urinary bladder. Small foci of high density in the left renal pelvis could represent tiny nonobstructive stones. Stomach/Bowel: Stomach is within normal limits. Appendix appears normal. No evidence of bowel wall thickening, distention, or inflammatory changes. Lymphatic: Small lymph nodes in the upper abdomen. Overall,  no significant lymph node enlargement in the abdomen or pelvis. Reproductive: Uterus and bilateral adnexa are unremarkable. Other: Slightly increased amount of pelvic ascites. Overall, the pelvic ascites is small. Diffuse subcutaneous edema. Musculoskeletal: No acute bone abnormality. IMPRESSION: VASCULAR 1. Atherosclerotic disease in the abdomen and pelvis without significant stenosis. Aortic Atherosclerosis (ICD10-I70.0). 2. 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 3. Visceral arteries are patent. No significant visceral artery stenosis. NON-VASCULAR 1. Evidence for a fluid overload state. Slightly increased abdominal and pelvic ascites. Mild edema and stranding around the pancreas. Diffuse subcutaneous edema. Evidence for increased right heart pressures. Findings may be related to underlying cardiac disease. Edema and fluid around the pancreas is nonspecific and recommend correlation with amylase  and lipase. 2. Diffuse wall thickening involving the urinary bladder. Findings are similar to the recent examination. Findings could be related to cystitis. 3. Slightly enlarged right pleural effusion. Persistent volume loss and consolidation at the lung bases, particularly at the left lung base. 4. Probable tiny nonobstructive stones in left renal pelvis. Electronically Signed   By: Markus Daft M.D.   On: 10/11/2019 17:10     Medications:   . sodium chloride    . sodium chloride    . cefTRIAXone (ROCEPHIN)  IV 1 g (10/11/19 1556)  . fluconazole (DIFLUCAN) IV 100 mg (10/11/19 1452)   . amiodarone  200 mg Oral Daily  . apixaban  5 mg Oral BID  . vitamin C  500 mg Oral BID  . atorvastatin  40 mg Oral Daily  . Chlorhexidine Gluconate Cloth  6 each Topical Q0600  . epoetin (EPOGEN/PROCRIT) injection  10,000 Units Intravenous Q M,W,F-HD  . feeding supplement  1 Container Oral TID BM  . FLUoxetine  20 mg Oral Daily  . folic acid  1 mg Oral Daily  . hydrALAZINE  10 mg Oral BID  . levothyroxine  88 mcg Oral Q0600  . multivitamin  1 tablet Oral QHS  . naLOXone (NARCAN)  injection  0.4 mg Intravenous Once  . pantoprazole (PROTONIX) IV  40 mg Intravenous Q12H  . sodium chloride flush  3 mL Intravenous Once  . traZODone  50 mg Oral QHS   sodium chloride, sodium chloride, acetaminophen, alteplase, fluticasone, heparin, lidocaine (PF), lidocaine-prilocaine, melatonin, naLOXone (NARCAN)  injection, nitroGLYCERIN, ondansetron **OR** ondansetron (ZOFRAN) IV, pentafluoroprop-tetrafluoroeth, promethazine  Assessment/ Plan:  Jamie Arias is a 64 y.o.  female with end stage renal disease on hemodialysis, hypertension, pulmonary hypertension, hypothroidism, diabetes mellitus type II, diabetic neuropathy, Hyperlipidemia,  congestive heart failure  was admitted on  10/08/2019 for Generalized abdominal pain [R10.84] Hypoxia [R09.02] ESRD (end stage renal disease) ([place]) [N18.6] Nausea vomiting and  diarrhea [R11.2, R19.7]  [facility] MWF [address] [address] Left AVG 78.5 kg  1. ESRD on HD: seen and examined on hemodialysis treatment. Tolerating treatment well.  - Scheduled for vascular to remove permcath - Continue MWF  2. Anemia in CKD: hemoglobin 7.9 - ESA with HD treatments.   3. Hypertension: Not currently taking blood pressure medications at home. Started on hydralazine 10mg  bid PO this admission.   4. Secondary Hyperparathyroidism: PTH 83 on this admission. Phosphorus low at 2.6. Holding all phosphorus binders and vitamin D agents.    LOS: 4 Keyandre Pileggi 8/9/20211:47 PM

## 2019-10-12 NOTE — Progress Notes (Signed)
[name], female   DOB: [birth year], 64 y.o.   MRN: [id number] [facility] PROGRESS NOTE  Jamie Arias:[id number] DOB: [birth year] DOA: 10/08/2019 PCP: [name], MD  HPI/Subjective: Patient seen while on dialysis.  She states that she still in pain.  We were unable to give any pain medication secondary to having reverse pain medications for unresponsive episode yesterday.  Patient initially admitted with abdominal pain nausea vomiting and diarrhea.  Objective: Vitals:   10/12/19 1200 10/12/19 1215  BP: (!) 171/65 (!) 181/60  Pulse: 69 68  Resp: 16 16  Temp:    SpO2:     No intake or output data in the 24 hours ending 10/12/19 1225 Filed Weights   10/08/19 1059 10/10/19 0843 10/10/19 1300  Weight: 77.6 kg 29.4 kg 74.2 kg    ROS: Review of Systems  Respiratory: Negative for shortness of breath.   Cardiovascular: Negative for chest pain.  Gastrointestinal: Positive for abdominal pain and nausea.   Exam: Physical Exam HENT:     Nose: No mucosal edema.     Mouth/Throat:     Pharynx: No oropharyngeal exudate.  Eyes:     General: Lids are normal.     Conjunctiva/sclera: Conjunctivae normal.     Pupils: Pupils are equal, round, and reactive to light.  Cardiovascular:     Rate and Rhythm: Normal rate and regular rhythm.     Heart sounds: Normal heart sounds, S1 normal and S2 normal.  Pulmonary:     Breath sounds: No decreased breath sounds, wheezing, rhonchi or rales.  Abdominal:     Palpations: Abdomen is soft.     Tenderness: There is abdominal tenderness in the epigastric area.  Musculoskeletal:     Right lower leg: No swelling.     Left lower leg: No swelling.  Skin:    General: Skin is warm.     Findings: No rash.  Neurological:     Mental Status: She is alert and oriented to person, place, and time.       Data Reviewed: Basic Metabolic Panel: Recent Labs  Lab 10/08/19 1111 10/09/19 0431 10/09/19 0847 10/10/19 0703  10/12/19 0410  NA 141 138  --  139 137  K 4.4 3.4*  --  3.3* 3.1*  CL 94* 97*  --  98 98  CO2 29 30  --  26 29  GLUCOSE 192* 121*  --  128* 85  BUN 28* 14  --  11 22  CREATININE 5.46* 3.09*  --  2.53* 4.55*  CALCIUM 9.5 8.6*  --  8.7* 8.3*  PHOS  --   --  2.6  --   --    Liver Function Tests: Recent Labs  Lab 10/08/19 1111 10/12/19 0410  AST 93* 55*  ALT 17 14  ALKPHOS 50 41  BILITOT 2.2* 1.1  PROT 7.4 5.5*  ALBUMIN 3.1* 2.5*   Recent Labs  Lab 10/08/19 1111 10/12/19 0410  LIPASE 17 170*   CBC: Recent Labs  Lab 10/08/19 1111 10/09/19 0431 10/10/19 0703 10/11/19 0542 10/12/19 0410  WBC 4.8 5.7 5.7 5.9 6.7  HGB 8.0* 7.9* 7.7* 8.2* 7.9*  HCT 25.2* 24.2* 24.7* 25.5* 24.7*  MCV 93.3 91.7 93.6 89.5 91.1  PLT 158 145* 148* 154 146*   BNP (last 3 results) Recent Labs    03/01/19 0429  BNP 720.0*    CBG: Recent Labs  Lab 10/10/19 0853 10/11/19 1251  GLUCAP 118* 114*  Recent Results (from the past 240 hour(s))  SARS Coronavirus 2 by RT PCR (hospital order, performed in [facility] hospital lab) Nasopharyngeal Nasopharyngeal Swab     Status: None   Collection Time: 10/08/19  2:29 PM   Specimen: Nasopharyngeal Swab  Result Value Ref Range Status   SARS Coronavirus 2 NEGATIVE NEGATIVE Final    Comment: (NOTE) SARS-CoV-2 target nucleic acids are NOT DETECTED.  The SARS-CoV-2 RNA is generally detectable in upper and lower respiratory specimens during the acute phase of infection. The lowest concentration of SARS-CoV-2 viral copies this assay can detect is 250 copies / mL. A negative result does not preclude SARS-CoV-2 infection and should not be used as the sole basis for treatment or other patient management decisions.  A negative result may occur with improper specimen collection / handling, submission of specimen other than nasopharyngeal swab, presence of viral mutation(s) within the areas targeted by this assay, and inadequate number of viral  copies (<250 copies / mL). A negative result must be combined with clinical observations, patient history, and epidemiological information.  Fact Sheet for Patients:   StrictlyIdeas.no  Fact Sheet for Healthcare Providers: BankingDealers.co.za  This test is not yet approved or  cleared by the [place] FDA and has been authorized for detection and/or diagnosis of SARS-CoV-2 by FDA under an Emergency Use Authorization (EUA).  This EUA will remain in effect (meaning this test can be used) for the duration of the COVID-19 declaration under Section 564(b)(1) of the Act, 21 U.S.C. section 360bbb-3(b)(1), unless the authorization is terminated or revoked sooner.  Performed at [facility], [address] [place], [place] [address]   Urine Culture     Status: Abnormal   Collection Time: 10/08/19  2:31 PM   Specimen: Urine, Random  Result Value Ref Range Status   Specimen Description   Final    URINE, RANDOM Performed at [facility], [address] [place], [place] [address]    Special Requests   Final    NONE Performed at [facility], [address] [place], [place] [address]    Culture >=100,000 COLONIES/mL ESCHERICHIA COLI (A)  Final   Report Status 10/10/2019 FINAL  Final   Organism ID, Bacteria ESCHERICHIA COLI (A)  Final      Susceptibility   Escherichia coli - MIC*    AMPICILLIN >=32 RESISTANT Resistant     CEFAZOLIN <=4 SENSITIVE Sensitive     CEFTRIAXONE <=0.25 SENSITIVE Sensitive     CIPROFLOXACIN INTERMEDIATE Intermediate     GENTAMICIN <=1 SENSITIVE Sensitive     IMIPENEM <=0.25 SENSITIVE Sensitive     NITROFURANTOIN <=16 SENSITIVE Sensitive     TRIMETH/SULFA >=320 RESISTANT Resistant     AMPICILLIN/SULBACTAM 16 INTERMEDIATE Intermediate     PIP/TAZO <=4 SENSITIVE Sensitive     * >=100,000 COLONIES/mL ESCHERICHIA COLI  MRSA PCR Screening     Status: None   Collection Time:  10/09/19  2:46 AM   Specimen: Nasopharyngeal  Result Value Ref Range Status   MRSA by PCR NEGATIVE NEGATIVE Final    Comment:        The GeneXpert MRSA Assay (FDA approved for NASAL specimens only), is one component of a comprehensive MRSA colonization surveillance program. It is not intended to diagnose MRSA infection nor to guide or monitor treatment for MRSA infections. Performed at [facility], [address], [place], [place] [address]      Studies: CT HEAD WO CONTRAST  Result Date: 10/11/2019 CLINICAL  DATA:  Mental status change unknown cause EXAM: CT HEAD WITHOUT CONTRAST TECHNIQUE: Contiguous axial images were obtained from the base of the skull through the vertex without intravenous contrast. COMPARISON:  MRI head 03/06/2019 FINDINGS: Brain: Mild generalized atrophy. Negative for hydrocephalus. Patchy hypodensity throughout the cerebral white matter bilaterally and also in the pons similar to the prior MRI. Negative for acute infarct, hemorrhage, mass Vascular: Diffuse vascular enhancement due to CTA performed earlier today. Skull: Negative Sinuses/Orbits: Paranasal sinuses clear. Bilateral cataract extraction. Other: None IMPRESSION: Atrophy and chronic ischemic change in the pons and white matter. No acute abnormality. Electronically Signed   By: Franchot Gallo M.D.   On: 10/11/2019 13:50   CT Angio Abd/Pel w/ and/or w/o  Result Date: 10/11/2019 CLINICAL DATA:  64 year old with left upper quadrant abdominal pain. EXAM: CT ANGIOGRAPHY ABDOMEN AND PELVIS WITH CONTRAST AND WITHOUT CONTRAST TECHNIQUE: Multidetector CT imaging of the abdomen and pelvis was performed using the standard protocol during bolus administration of intravenous contrast. Multiplanar reconstructed images and MIPs were obtained and reviewed to evaluate the vascular anatomy. CONTRAST:  193mL OMNIPAQUE IOHEXOL 350 MG/ML SOLN COMPARISON:  CT abdomen and pelvis 10/08/2019 FINDINGS: VASCULAR Aorta:  Atherosclerotic disease in the abdominal aorta without aneurysm or dissection. No aortic stenosis. Celiac: Celiac trunk is widely patent without aneurysm, dissection or significant stenosis. Diffuse atherosclerotic disease in the splenic artery. There may be a small splenic artery aneurysm near the hilum which is calcified measuring roughly 8 mm. SMA: Patent without evidence of aneurysm, dissection, vasculitis or significant stenosis. Incidentally, there is a replaced right hepatic artery. Renals: Atherosclerotic plaque involving the proximal left renal artery without significant stenosis. Right renal artery is widely patent without significant stenosis. No evidence for aneurysm or dissection involving the renal arteries. IMA: Patent Inflow: Mild atherosclerotic disease involving the bilateral iliac arteries. Common, internal and external iliac arteries are patent bilaterally. No evidence for aneurysm, dissection or significant stenosis in the iliac arteries. Proximal Outflow: Proximal femoral arteries are patent bilaterally. Veins: Portal venous system is patent. No gross abnormality to the iliac veins or IVC. There is contrast refluxing into the hepatic veins which can be associated with increased right heart pressures. Review of the MIP images confirms the above findings. NON-VASCULAR Lower chest: Right pleural effusion has slightly enlarged in size. Compressive atelectasis in both lower lobes particularly on the left side. The volume loss in left lower lobe is similar to the previous examination. Hepatobiliary: Cholecystectomy. Again noted is mild dilatation of the biliary system. Common bile duct measures roughly 9 mm and stable. Mild biliary dilatation may be secondary to the cholecystectomy. No discrete liver lesion. Pancreas: Minimal stranding or fluid between the duodenum and the pancreas on sequence 6 image 64. Subtle edema around the pancreatic head and uncinate process on sequence 6, image 84 is probably  new. No significant pancreatic duct dilatation. Spleen: Again noted is a small amount of perisplenic ascites. Adrenals/Urinary Tract: Normal appearance of the adrenal glands. Again noted are bilateral atrophic kidneys and compatible with history of end-stage renal disease. No hydronephrosis and no suspicious renal lesion. Again noted is diffuse wall thickening in the urinary bladder. Small foci of high density in the left renal pelvis could represent tiny nonobstructive stones. Stomach/Bowel: Stomach is within normal limits. Appendix appears normal. No evidence of bowel wall thickening, distention, or inflammatory changes. Lymphatic: Small lymph nodes in the upper abdomen. Overall, no significant lymph node enlargement in the abdomen or pelvis. Reproductive: Uterus and bilateral adnexa are  unremarkable. Other: Slightly increased amount of pelvic ascites. Overall, the pelvic ascites is small. Diffuse subcutaneous edema. Musculoskeletal: No acute bone abnormality. IMPRESSION: VASCULAR 1. Atherosclerotic disease in the abdomen and pelvis without significant stenosis. Aortic Atherosclerosis (ICD10-I70.0). 2. 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 3. Visceral arteries are patent. No significant visceral artery stenosis. NON-VASCULAR 1. Evidence for a fluid overload state. Slightly increased abdominal and pelvic ascites. Mild edema and stranding around the pancreas. Diffuse subcutaneous edema. Evidence for increased right heart pressures. Findings may be related to underlying cardiac disease. Edema and fluid around the pancreas is nonspecific and recommend correlation with amylase and lipase. 2. Diffuse wall thickening involving the urinary bladder. Findings are similar to the recent examination. Findings could be related to cystitis. 3. Slightly enlarged right pleural effusion. Persistent volume loss and consolidation at the lung bases, particularly at the left lung base.  4. Probable tiny nonobstructive stones in left renal pelvis. Electronically Signed   By: Markus Daft M.D.   On: 10/11/2019 17:10    Scheduled Meds: . amiodarone  200 mg Oral Daily  . apixaban  5 mg Oral BID  . vitamin C  500 mg Oral BID  . atorvastatin  40 mg Oral Daily  . Chlorhexidine Gluconate Cloth  6 each Topical Q0600  . epoetin (EPOGEN/PROCRIT) injection  10,000 Units Intravenous Q M,W,F-HD  . feeding supplement  1 Container Oral TID BM  . FLUoxetine  20 mg Oral Daily  . folic acid  1 mg Oral Daily  . hydrALAZINE  10 mg Oral BID  . levothyroxine  88 mcg Oral Q0600  . multivitamin  1 tablet Oral QHS  . naLOXone (NARCAN)  injection  0.4 mg Intravenous Once  . pantoprazole (PROTONIX) IV  40 mg Intravenous Q12H  . sodium chloride flush  3 mL Intravenous Once  . traZODone  50 mg Oral QHS   Continuous Infusions: . sodium chloride    . sodium chloride    . cefTRIAXone (ROCEPHIN)  IV 1 g (10/11/19 1556)  . fluconazole (DIFLUCAN) IV 100 mg (10/11/19 1452)    Assessment/Plan:  1. Epigastric abdominal pain with nausea and not being able to eat.  Endoscopy showed some erythema and some whitish plaques in the esophagus.  I put on IV Diflucan just in case this is thrush.  CT angio of the abdomen pelvis did not show any blockages.  Patient on empiric Protonix.  Lipase slightly high today but was normal on admission not sure what to make of this.  We will get an EKG. 2. Acute cystitis with E. coli and Rocephin 3. Essential hypertension on hydralazine. 4. End-stage renal disease on dialysis 5. Paroxysmal atrial fibrillation on amiodarone and Eliquis 6. Anemia of chronic disease.  May end up needing a transfusion during the hospital course 7. Hyperlipidemia unspecified on atorvastatin 8. Palliative care consultation because she is not eating very well. 9. Chronic diastolic congestive heart failure dialysis will remove fluid.      Code Status:     Code Status Orders  (From  admission, onward)         Start     Ordered   10/08/19 1508  Full code  Continuous        10/08/19 1508        Code Status History    Date Active Date Inactive Code Status Order ID Comments User Context   09/09/2019 0359 09/10/2019 2011 Full Code [id number]  Damita Dunnings,  Waldemar Dickens, MD ED   06/29/2019 2028 07/01/2019 0108 Full Code [id number]  Ivor Costa, MD Inpatient   02/21/2019 0932 04/21/2019 0155 DNR [id number]  Sidney Ace, MD Inpatient   02/09/2019 1755 02/21/2019 0932 Full Code [id number]  Thornell Mule, MD ED   08/24/2018 0022 09/15/2018 1720 Full Code [id number]  Shela Leff, MD Inpatient   09/04/2017 1510 09/06/2017 1920 Full Code [id number]  Epifanio Lesches, MD ED   02/15/2017 1243 02/16/2017 2023 Full Code [id number]  Saundra Shelling, MD Inpatient   10/23/2015 0828 10/23/2015 0908 Full Code [id number]  Saundra Shelling, MD Inpatient   09/03/2015 0130 09/03/2015 1619 Full Code [id number]  Lance Coon, MD Inpatient   07/02/2013 1327 07/14/2013 1540 Full Code [id number]  Nani Skillern, PA-C Inpatient   06/29/2013 2153 07/02/2013 1327 Full Code [id number]  Larey Dresser, MD Inpatient   Advance Care Planning Activity     Family Communication: Spoke with husband on the phone Disposition Plan: Status is: Inpatient  Dispo: The patient is from: Home              Anticipated d/c is to: Hopefully home.              Anticipated d/c date is: Unclear at this point.  The patient will have to tolerate diet prior to any disposition              Patient currently not able to eat.  Consultants:  Nephrology  Antibiotics:  Rocephin  Diflucan  Time spent: 27 minutes  [place]

## 2019-10-12 NOTE — Care Management Important Message (Signed)
Important Message  Patient Details  Name: Jamie Arias MRN: [id number] Date of Birth: [birth year]   Medicare Important Message Given:  Yes     Dannette Barbara 10/12/2019, 11:42 AM

## 2019-10-12 NOTE — Progress Notes (Signed)
[name], MD [address]  [place]  [place], [place] [address]  Main: [phone]  Fax: [phone] Pager: [phone]   Subjective: Patient returned from dialysis and has been unresponsive.  Nurse is administering her Narcan.  CT angio was negative for any stenotic lesion   Objective: Vital signs in last 24 hours: Vitals:   10/12/19 1245 10/12/19 1300 10/12/19 1336 10/12/19 1402  BP: (!) 143/65 (!) 153/55 (!) 127/42 130/71  Pulse: 66 (!) 59 61 61  Resp: 14 16 15    Temp:  98.2 F (36.8 C)  97.8 F (36.6 C)  TempSrc:  Oral  Oral  SpO2:  99% 91% 100%  Weight:      Height:       Weight change:   Intake/Output Summary (Last 24 hours) at 10/12/2019 1501 Last data filed at 10/12/2019 1300 Gross per 24 hour  Intake --  Output 500 ml  Net -500 ml     Exam: Heart:: Regular rate and rhythm, S1S2 present or without murmur or extra heart sounds Lungs: normal and clear to auscultation Abdomen: soft, nontender, normal bowel sounds   Lab Results: CBC Latest Ref Rng & Units 10/12/2019 10/11/2019 10/10/2019  WBC 4.0 - 10.5 K/uL 6.7 5.9 5.7  Hemoglobin 12.0 - 15.0 g/dL 7.9(L) 8.2(L) 7.7(L)  Hematocrit 36 - 46 % 24.7(L) 25.5(L) 24.7(L)  Platelets 150 - 400 K/uL 146(L) 154 148(L)   CMP Latest Ref Rng & Units 10/12/2019 10/10/2019 10/09/2019  Glucose 70 - 99 mg/dL 85 128(H) 121(H)  BUN 8 - 23 mg/dL 22 11 14   Creatinine 0.44 - 1.00 mg/dL 4.55(H) 2.53(H) 3.09(H)  Sodium 135 - 145 mmol/L 137 139 138  Potassium 3.5 - 5.1 mmol/L 3.1(L) 3.3(L) 3.4(L)  Chloride 98 - 111 mmol/L 98 98 97(L)  CO2 22 - 32 mmol/L 29 26 30   Calcium 8.9 - 10.3 mg/dL 8.3(L) 8.7(L) 8.6(L)  Total Protein 6.5 - 8.1 g/dL 5.5(L) - -  Total Bilirubin 0.3 - 1.2 mg/dL 1.1 - -  Alkaline Phos 38 - 126 U/L 41 - -  AST 15 - 41 U/L 55(H) - -  ALT 0 - 44 U/L 14 - -    Micro Results: Recent Results (from the past 240 hour(s))  SARS Coronavirus 2 by RT PCR (hospital order, performed in [facility] hospital  lab) Nasopharyngeal Nasopharyngeal Swab     Status: None   Collection Time: 10/08/19  2:29 PM   Specimen: Nasopharyngeal Swab  Result Value Ref Range Status   SARS Coronavirus 2 NEGATIVE NEGATIVE Final    Comment: (NOTE) SARS-CoV-2 target nucleic acids are NOT DETECTED.  The SARS-CoV-2 RNA is generally detectable in upper and lower respiratory specimens during the acute phase of infection. The lowest concentration of SARS-CoV-2 viral copies this assay can detect is 250 copies / mL. A negative result does not preclude SARS-CoV-2 infection and should not be used as the sole basis for treatment or other patient management decisions.  A negative result may occur with improper specimen collection / handling, submission of specimen other than nasopharyngeal swab, presence of viral mutation(s) within the areas targeted by this assay, and inadequate number of viral copies (<250 copies / mL). A negative result must be combined with clinical observations, patient history, and epidemiological information.  Fact Sheet for Patients:   StrictlyIdeas.no  Fact Sheet for Healthcare Providers: BankingDealers.co.za  This test is not yet approved or  cleared by the [place] FDA and has been authorized for detection  and/or diagnosis of SARS-CoV-2 by FDA under an Emergency Use Authorization (EUA).  This EUA will remain in effect (meaning this test can be used) for the duration of the COVID-19 declaration under Section 564(b)(1) of the Act, 21 U.S.C. section 360bbb-3(b)(1), unless the authorization is terminated or revoked sooner.  Performed at [facility], [address] [place], [place] [address]   Urine Culture     Status: Abnormal   Collection Time: 10/08/19  2:31 PM   Specimen: Urine, Random  Result Value Ref Range Status   Specimen Description   Final    URINE, RANDOM Performed at [facility], [address]  [place], [place] [address]    Special Requests   Final    NONE Performed at [facility], [address] [place], [place] [address]    Culture >=100,000 COLONIES/mL ESCHERICHIA COLI (A)  Final   Report Status 10/10/2019 FINAL  Final   Organism ID, Bacteria ESCHERICHIA COLI (A)  Final      Susceptibility   Escherichia coli - MIC*    AMPICILLIN >=32 RESISTANT Resistant     CEFAZOLIN <=4 SENSITIVE Sensitive     CEFTRIAXONE <=0.25 SENSITIVE Sensitive     CIPROFLOXACIN INTERMEDIATE Intermediate     GENTAMICIN <=1 SENSITIVE Sensitive     IMIPENEM <=0.25 SENSITIVE Sensitive     NITROFURANTOIN <=16 SENSITIVE Sensitive     TRIMETH/SULFA >=320 RESISTANT Resistant     AMPICILLIN/SULBACTAM 16 INTERMEDIATE Intermediate     PIP/TAZO <=4 SENSITIVE Sensitive     * >=100,000 COLONIES/mL ESCHERICHIA COLI  MRSA PCR Screening     Status: None   Collection Time: 10/09/19  2:46 AM   Specimen: Nasopharyngeal  Result Value Ref Range Status   MRSA by PCR NEGATIVE NEGATIVE Final    Comment:        The GeneXpert MRSA Assay (FDA approved for NASAL specimens only), is one component of a comprehensive MRSA colonization surveillance program. It is not intended to diagnose MRSA infection nor to guide or monitor treatment for MRSA infections. Performed at [facility], [address] [place], [place] [address]    Studies/Results: CT HEAD WO CONTRAST  Result Date: 10/11/2019 CLINICAL DATA:  Mental status change unknown cause EXAM: CT HEAD WITHOUT CONTRAST TECHNIQUE: Contiguous axial images were obtained from the base of the skull through the vertex without intravenous contrast. COMPARISON:  MRI head 03/06/2019 FINDINGS: Brain: Mild generalized atrophy. Negative for hydrocephalus. Patchy hypodensity throughout the cerebral white matter bilaterally and also in the pons similar to the prior MRI. Negative for acute infarct, hemorrhage, mass Vascular: Diffuse vascular enhancement due to CTA  performed earlier today. Skull: Negative Sinuses/Orbits: Paranasal sinuses clear. Bilateral cataract extraction. Other: None IMPRESSION: Atrophy and chronic ischemic change in the pons and white matter. No acute abnormality. Electronically Signed   By: Franchot Gallo M.D.   On: 10/11/2019 13:50   CT Angio Abd/Pel w/ and/or w/o  Result Date: 10/11/2019 CLINICAL DATA:  64 year old with left upper quadrant abdominal pain. EXAM: CT ANGIOGRAPHY ABDOMEN AND PELVIS WITH CONTRAST AND WITHOUT CONTRAST TECHNIQUE: Multidetector CT imaging of the abdomen and pelvis was performed using the standard protocol during bolus administration of intravenous contrast. Multiplanar reconstructed images and MIPs were obtained and reviewed to evaluate the vascular anatomy. CONTRAST:  171mL OMNIPAQUE IOHEXOL 350 MG/ML SOLN COMPARISON:  CT abdomen and pelvis 10/08/2019 FINDINGS: VASCULAR Aorta: Atherosclerotic disease in the abdominal aorta without aneurysm or dissection. No aortic stenosis. Celiac: Celiac trunk is widely  patent without aneurysm, dissection or significant stenosis. Diffuse atherosclerotic disease in the splenic artery. There may be a small splenic artery aneurysm near the hilum which is calcified measuring roughly 8 mm. SMA: Patent without evidence of aneurysm, dissection, vasculitis or significant stenosis. Incidentally, there is a replaced right hepatic artery. Renals: Atherosclerotic plaque involving the proximal left renal artery without significant stenosis. Right renal artery is widely patent without significant stenosis. No evidence for aneurysm or dissection involving the renal arteries. IMA: Patent Inflow: Mild atherosclerotic disease involving the bilateral iliac arteries. Common, internal and external iliac arteries are patent bilaterally. No evidence for aneurysm, dissection or significant stenosis in the iliac arteries. Proximal Outflow: Proximal femoral arteries are patent bilaterally. Veins: Portal venous  system is patent. No gross abnormality to the iliac veins or IVC. There is contrast refluxing into the hepatic veins which can be associated with increased right heart pressures. Review of the MIP images confirms the above findings. NON-VASCULAR Lower chest: Right pleural effusion has slightly enlarged in size. Compressive atelectasis in both lower lobes particularly on the left side. The volume loss in left lower lobe is similar to the previous examination. Hepatobiliary: Cholecystectomy. Again noted is mild dilatation of the biliary system. Common bile duct measures roughly 9 mm and stable. Mild biliary dilatation may be secondary to the cholecystectomy. No discrete liver lesion. Pancreas: Minimal stranding or fluid between the duodenum and the pancreas on sequence 6 image 64. Subtle edema around the pancreatic head and uncinate process on sequence 6, image 84 is probably new. No significant pancreatic duct dilatation. Spleen: Again noted is a small amount of perisplenic ascites. Adrenals/Urinary Tract: Normal appearance of the adrenal glands. Again noted are bilateral atrophic kidneys and compatible with history of end-stage renal disease. No hydronephrosis and no suspicious renal lesion. Again noted is diffuse wall thickening in the urinary bladder. Small foci of high density in the left renal pelvis could represent tiny nonobstructive stones. Stomach/Bowel: Stomach is within normal limits. Appendix appears normal. No evidence of bowel wall thickening, distention, or inflammatory changes. Lymphatic: Small lymph nodes in the upper abdomen. Overall, no significant lymph node enlargement in the abdomen or pelvis. Reproductive: Uterus and bilateral adnexa are unremarkable. Other: Slightly increased amount of pelvic ascites. Overall, the pelvic ascites is small. Diffuse subcutaneous edema. Musculoskeletal: No acute bone abnormality. IMPRESSION: VASCULAR 1. Atherosclerotic disease in the abdomen and pelvis without  significant stenosis. Aortic Atherosclerosis (ICD10-I70.0). 2. 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 3. Visceral arteries are patent. No significant visceral artery stenosis. NON-VASCULAR 1. Evidence for a fluid overload state. Slightly increased abdominal and pelvic ascites. Mild edema and stranding around the pancreas. Diffuse subcutaneous edema. Evidence for increased right heart pressures. Findings may be related to underlying cardiac disease. Edema and fluid around the pancreas is nonspecific and recommend correlation with amylase and lipase. 2. Diffuse wall thickening involving the urinary bladder. Findings are similar to the recent examination. Findings could be related to cystitis. 3. Slightly enlarged right pleural effusion. Persistent volume loss and consolidation at the lung bases, particularly at the left lung base. 4. Probable tiny nonobstructive stones in left renal pelvis. Electronically Signed   By: Markus Daft M.D.   On: 10/11/2019 17:10   Medications:  I have reviewed the patient's current medications. Prior to Admission:  Medications Prior to Admission  Medication Sig Dispense Refill Last Dose  . atorvastatin (LIPITOR) 40 MG tablet Take 1 tablet (40 mg total) by  mouth daily. 90 tablet 1 10/06/2019 at 2000  . ELIQUIS 5 MG TABS tablet Take 1 tablet (5 mg total) by mouth 2 (two) times daily. 60 tablet 1 10/06/2019 at 2000  . FLUoxetine (PROZAC) 20 MG capsule Take 1 capsule (20 mg total) by mouth daily. 90 capsule 1 10/06/2019 at 0800  . levothyroxine (SYNTHROID) 88 MCG tablet Take 1 tablet (88 mcg total) by mouth daily before breakfast. 90 tablet 1 10/06/2019 at 0730  . lidocaine-prilocaine (EMLA) cream Apply 1 application topically as needed.   Past Week at Unknown time  . Melatonin 3 MG TABS Take 3 mg by mouth at bedtime as needed (sleep).    10/06/2019 at 2000  . multivitamin (RENA-VIT) TABS tablet Take 1 tablet by mouth at bedtime.  0 10/06/2019  at 2000  . nitroGLYCERIN (NITROSTAT) 0.4 MG SL tablet DISSOLVE ONE TABLET UNDER THE TONGUE EVERY 5 MINUTES AS NEEDED FOR CHEST PAIN.  DO NOT EXCEED A TOTAL OF 3 DOSES IN 15 MINUTES (Patient taking differently: Place 0.4 mg under the tongue every 5 (five) minutes as needed for chest pain. DISSOLVE ONE TABLET UNDER THE TONGUE EVERY 5 MINUTES AS NEEDED FOR CHEST PAIN.  DO NOT EXCEED A TOTAL OF 3 DOSES IN 15 MINUTES) 25 tablet 0 prn at prn  . omeprazole (PRILOSEC) 20 MG capsule Take 20 mg by mouth daily.   10/06/2019 at 0800  . ondansetron (ZOFRAN-ODT) 4 MG disintegrating tablet Take 1 tablet (4 mg total) by mouth every 8 (eight) hours as needed for nausea or vomiting. Patient needs appointment with PCP to receive further refills. 30 tablet 0 prn at prn  . PACERONE 200 MG tablet TAKE 1 TABLET BY MOUTH ONCE DAILY. PATIENT NEEDS APPOINTMENT WITH PCP FOR FURTHER REFILLS (Patient taking differently: Take 200 mg by mouth daily. ) 30 tablet 0 10/06/2019 at 0800  . traZODone (DESYREL) 50 MG tablet TAKE 1 TABLET BY MOUTH AT BEDTIME. PATIENT NEEDS APPOINTMENT WITH PCP FOR FURTHER REFILLS (Patient taking differently: Take 50 mg by mouth at bedtime. ) 30 tablet 0 10/06/2019 at 2000  . vitamin C (VITAMIN C) 500 MG tablet Take 1 tablet (500 mg total) by mouth daily.   10/06/2019 at 0800  . acetaminophen (TYLENOL) 325 MG tablet Take 650 mg by mouth daily as needed for moderate pain or headache.    prn at prn  . fluticasone (FLONASE) 50 MCG/ACT nasal spray Place 2 sprays into both nostrils daily. (Patient taking differently: Place 2 sprays into both nostrils daily as needed for allergies or rhinitis. ) 16 g 6 prn at prn  . folic acid (FOLVITE) 1 MG tablet Take 1 tablet (1 mg total) by mouth daily.   10/06/2019 at 0800  . gabapentin (NEURONTIN) 100 MG capsule Take 100 mg by mouth 3 (three) times daily.   10/06/2019 at 2000  . hydrALAZINE (APRESOLINE) 10 MG tablet Take 1 tablet (10 mg total) by mouth in the morning and at bedtime. 180  tablet 0 10/06/2019 at 2000  . hydrocortisone cream 1 % Apply topically as needed for itching. 30 g 0 prn at prn   Scheduled: . amiodarone  200 mg Oral Daily  . apixaban  5 mg Oral BID  . Chlorhexidine Gluconate Cloth  6 each Topical Q0600  . epoetin (EPOGEN/PROCRIT) injection  10,000 Units Intravenous Q M,W,F-HD  . feeding supplement  1 Container Oral TID BM  . FLUoxetine  20 mg Oral Daily  . folic acid  1 mg Oral Daily  .  hydrALAZINE  10 mg Oral BID  . levothyroxine  88 mcg Oral Q0600  . multivitamin  1 tablet Oral QHS  . naLOXone (NARCAN)  injection  0.4 mg Intravenous Once  . pantoprazole (PROTONIX) IV  40 mg Intravenous Q12H  . sodium chloride flush  3 mL Intravenous Once  . traZODone  50 mg Oral QHS   Continuous: . sodium chloride    . sodium chloride    . cefTRIAXone (ROCEPHIN)  IV 1 g (10/11/19 1556)  . fluconazole (DIFLUCAN) IV 100 mg (10/11/19 1452)   MQK:MMNOTR chloride, sodium chloride, acetaminophen, alteplase, fluticasone, heparin, lidocaine (PF), lidocaine-prilocaine, melatonin, naLOXone (NARCAN)  injection, nitroGLYCERIN, ondansetron **OR** ondansetron (ZOFRAN) IV, pentafluoroprop-tetrafluoroeth, promethazine Anti-infectives (From admission, onward)   Start     Dose/Rate Route Frequency Ordered Stop   10/10/19 1400  fluconazole (DIFLUCAN) IVPB 100 mg     Discontinue     100 mg 50 mL/hr over 60 Minutes Intravenous Every 24 hours 10/10/19 1239     10/10/19 1400  cefTRIAXone (ROCEPHIN) 1 g in sodium chloride 0.9 % 100 mL IVPB     Discontinue     1 g 200 mL/hr over 30 Minutes Intravenous Every 24 hours 10/10/19 1244 10/15/19 1359     Scheduled Meds: . amiodarone  200 mg Oral Daily  . apixaban  5 mg Oral BID  . Chlorhexidine Gluconate Cloth  6 each Topical Q0600  . epoetin (EPOGEN/PROCRIT) injection  10,000 Units Intravenous Q M,W,F-HD  . feeding supplement  1 Container Oral TID BM  . FLUoxetine  20 mg Oral Daily  . folic acid  1 mg Oral Daily  . hydrALAZINE  10  mg Oral BID  . levothyroxine  88 mcg Oral Q0600  . multivitamin  1 tablet Oral QHS  . naLOXone (NARCAN)  injection  0.4 mg Intravenous Once  . pantoprazole (PROTONIX) IV  40 mg Intravenous Q12H  . sodium chloride flush  3 mL Intravenous Once  . traZODone  50 mg Oral QHS   Continuous Infusions: . sodium chloride    . sodium chloride    . cefTRIAXone (ROCEPHIN)  IV 1 g (10/11/19 1556)  . fluconazole (DIFLUCAN) IV 100 mg (10/11/19 1452)   PRN Meds:.sodium chloride, sodium chloride, acetaminophen, alteplase, fluticasone, heparin, lidocaine (PF), lidocaine-prilocaine, melatonin, naLOXone (NARCAN)  injection, nitroGLYCERIN, ondansetron **OR** ondansetron (ZOFRAN) IV, pentafluoroprop-tetrafluoroeth, promethazine   Assessment: Active Problems:   Anemia of chronic disease   Nausea   Paroxysmal A-fib (HCC)   Nausea vomiting and diarrhea   Epigastric abdominal pain   Acute cystitis without hematuria   Plan: Abdominal pain with nausea and vomiting EGD is unremarkable other than mild gastric erythema, pathology results are pending CT angio is also unremarkable for any stenotic lesions to explain abdominal pain Please follow-up on pathology results, otherwise GI will sign off at this time Overall prognosis is guarded, palliative care is on board   LOS: 4 days   Shenica Holzheimer 10/12/2019, 3:01 PM

## 2019-10-12 NOTE — Consult Note (Signed)
[facility] Admission History & Physical  MRN : [id number]  Jamie Arias is a 64 y.o. (05/07/1955) female who presents with chief complaint of  Chief Complaint  Patient presents with  . Abdominal Pain   History of Present Illness:  I am asked to evaluate the patient by her nephrologist Dr. Juleen China. The patient was scheduled as an outpatient last week for a permcath removal however the patient was admitted for abdominal pain.    The patient reports no issues with any of their dialysis runs. They are reporting good flows with good parameters at dialysis. Patient denies pain or tenderness overlying the access.  There is no pain with dialysis.  The patient denies hand pain or finger pain consistent with steal syndrome.  No fevers or chills while on dialysis. Patient was seen and examined in dialysis.   Current Facility-Administered Medications  Medication Dose Route Frequency Provider Last Rate Last Admin  . 0.9 %  sodium chloride infusion  100 mL Intravenous PRN Lateef, Munsoor, MD      . 0.9 %  sodium chloride infusion  100 mL Intravenous PRN Lateef, Munsoor, MD      . acetaminophen (TYLENOL) tablet 650 mg  650 mg Oral Daily PRN Loletha Grayer, MD   650 mg at 10/11/19 1859  . alteplase (CATHFLO ACTIVASE) injection 2 mg  2 mg Intracatheter Once PRN Lateef, Munsoor, MD      . amiodarone (PACERONE) tablet 200 mg  200 mg Oral Daily Loletha Grayer, MD   200 mg at 10/11/19 1340  . apixaban (ELIQUIS) tablet 5 mg  5 mg Oral BID Pernell Dupre, RPH   5 mg at 10/11/19 2116  . ascorbic acid (VITAMIN C) tablet 500 mg  500 mg Oral BID Loletha Grayer, MD   500 mg at 10/11/19 2115  . atorvastatin (LIPITOR) tablet 40 mg  40 mg Oral Daily Loletha Grayer, MD   40 mg at 10/11/19 1339  . cefTRIAXone (ROCEPHIN) 1 g in sodium chloride 0.9 % 100 mL IVPB  1 g Intravenous Q24H Loletha Grayer, MD 200 mL/hr at 10/11/19 1556 1 g at 10/11/19 1556  . Chlorhexidine Gluconate Cloth 2  % PADS 6 each  6 each Topical Q0600 Anthonette Legato, MD   6 each at 10/11/19 0544  . epoetin alfa (EPOGEN) injection 10,000 Units  10,000 Units Intravenous Q M,W,F-HD Kolluru, Sarath, MD      . feeding supplement (BOOST / RESOURCE BREEZE) liquid 1 Container  1 Container Oral TID BM Loletha Grayer, MD   1 Container at 10/11/19 1501  . fluconazole (DIFLUCAN) IVPB 100 mg  100 mg Intravenous Q24H Loletha Grayer, MD 50 mL/hr at 10/11/19 1452 100 mg at 10/11/19 1452  . FLUoxetine (PROZAC) capsule 20 mg  20 mg Oral Daily Loletha Grayer, MD   20 mg at 10/11/19 1801  . fluticasone (FLONASE) 50 MCG/ACT nasal spray 2 spray  2 spray Each Nare Daily PRN Wieting, Richard, MD      . folic acid (FOLVITE) tablet 1 mg  1 mg Oral Daily Wieting, Richard, MD   1 mg at 10/11/19 1339  . heparin injection 1,000 Units  1,000 Units Dialysis PRN Lateef, Munsoor, MD      . hydrALAZINE (APRESOLINE) tablet 10 mg  10 mg Oral BID Loletha Grayer, MD   10 mg at 10/11/19 1801  . levothyroxine (SYNTHROID) tablet 88 mcg  88 mcg Oral Q0600 Loletha Grayer, MD   88 mcg at 10/12/19 0620  .  lidocaine (PF) (XYLOCAINE) 1 % injection 5 mL  5 mL Intradermal PRN Lateef, Munsoor, MD      . lidocaine-prilocaine (EMLA) cream 1 application  1 application Topical PRN Lateef, Munsoor, MD      . melatonin tablet 5 mg  5 mg Oral QHS PRN Wieting, Richard, MD      . multivitamin (RENA-VIT) tablet 1 tablet  1 tablet Oral QHS Wieting, Richard, MD   1 tablet at 10/11/19 2115  . naloxone [facility]) injection 0.4 mg  0.4 mg Intravenous PRN Loletha Grayer, MD   0.4 mg at 10/11/19 1305  . naloxone [facility]) injection 0.4 mg  0.4 mg Intravenous Once Wieting, Richard, MD      . nitroGLYCERIN (NITROSTAT) SL tablet 0.4 mg  0.4 mg Sublingual Q5 min PRN Wieting, Richard, MD      . ondansetron [facility]) tablet 4 mg  4 mg Oral Q6H PRN Loletha Grayer, MD   4 mg at 10/10/19 0530   Or  . ondansetron (ZOFRAN) injection 4 mg  4 mg Intravenous Q6H PRN Loletha Grayer, MD   4 mg at 10/11/19 1129  . pantoprazole (PROTONIX) injection 40 mg  40 mg Intravenous Q12H Loletha Grayer, MD   40 mg at 10/11/19 2115  . pentafluoroprop-tetrafluoroeth (GEBAUERS) aerosol 1 application  1 application Topical PRN Lateef, Munsoor, MD      . promethazine (PHENERGAN) injection 6.25 mg  6.25 mg Intravenous Q8H PRN Loletha Grayer, MD   6.25 mg at 10/09/19 1843  . sodium chloride flush (NS) 0.9 % injection 3 mL  3 mL Intravenous Once Duffy Bruce, MD      . traZODone (DESYREL) tablet 50 mg  50 mg Oral QHS Loletha Grayer, MD   50 mg at 10/11/19 2115    Past Medical History:  Diagnosis Date  . Anemia   . Anginal pain ([place])   . Arthritis    upper back and neck   . Chronic diastolic CHF (congestive heart failure) ([place])    a. Due to ischemic cardiomyopathy. EF as low as 35%, improved to normal s/p CABG; b. echo 07/06/13: EF 55-60%, no RWMAs, mod TR, trivial pericardial effusion not c/w tamponade physiology;  c. 10/2015 Echo: EF 65%, Gr1 DD, triv AI, mild MR, mildly dil LA, mod TR, PASP 39mmHg.  Marland Kitchen Coronary artery disease    a. NSTEMI 06/2013; b.cath: severe three-vessel CAD w/ EF 30% & mild-mod MR; c. s/p 3 vessel CABG 07/02/13 (LIMA-LAD, SVG-OM, and SVG-RPDA);  d. 10/2015 MV: no ischemia/infarct.  . Diabetes mellitus without complication ([place])   . Diabetic neuropathy ([place])   . Dialysis patient [facility])    MWF  . ESRD (end stage renal disease) ([place])    a. 12/2015 initiated - mwf dialysis.  Marland Kitchen GERD (gastroesophageal reflux disease)   . Hyperlipidemia   . Hypertension   . Hypothyroidism   . Myocardial infarction ([place]) 2014  . Neuropathy   . Pleural effusion 2015  . Pulmonary hypertension ([place])   . Renal insufficiency   . Wears dentures    full lower   Past Surgical History:  Procedure Laterality Date  . A/V FISTULAGRAM Right 12/17/2016   Procedure: A/V Fistulagram;  Surgeon: Algernon Huxley, MD;  Location: [place] [facility];  Service: Cardiovascular;  Laterality:  Right;  . A/V FISTULAGRAM Right 01/07/2017   Procedure: A/V Fistulagram;  Surgeon: Algernon Huxley, MD;  Location: [place] [facility];  Service: Cardiovascular;  Laterality: Right;  . A/V FISTULAGRAM Right 12/03/2017  Procedure: A/V FISTULAGRAM;  Surgeon: Katha Cabal, MD;  Location: [place] [facility];  Service: Cardiovascular;  Laterality: Right;  . A/V FISTULAGRAM Right 02/23/2019   Procedure: A/V Fistulagram;  Surgeon: Algernon Huxley, MD;  Location: [place] [facility];  Service: Cardiovascular;  Laterality: Right;  . A/V SHUNT INTERVENTION N/A 02/22/2017   Procedure: A/V SHUNT INTERVENTION;  Surgeon: Algernon Huxley, MD;  Location: [place] [facility];  Service: Cardiovascular;  Laterality: N/A;  . AV FISTULA PLACEMENT Right 02/03/2016   Procedure: INSERTION OF ARTERIOVENOUS (AV) GORE-TEX GRAFT ARM ( BRACH / AXILLARY );  Surgeon: Katha Cabal, MD;  Location: [facility];  Service: Vascular;  Laterality: Right;  . AV FISTULA PLACEMENT Left 07/09/2019   Procedure: INSERTION OF ARTERIOVENOUS (AV) GORE-TEX GRAFT ARM ( BRACHIAL AXILLARY );  Surgeon: Algernon Huxley, MD;  Location: [facility];  Service: Vascular;  Laterality: Left;  . [place] REMOVAL Right 03/04/2019   Procedure: REMOVAL OF ARTERIOVENOUS GORETEX GRAFT ([place]);  Surgeon: Algernon Huxley, MD;  Location: [facility];  Service: Vascular;  Laterality: Right;  . CARDIAC CATHETERIZATION    . CATARACT EXTRACTION Bilateral   . CHOLECYSTECTOMY N/A 12/09/2014   Procedure: LAPAROSCOPIC CHOLECYSTECTOMY;  Surgeon: Marlyce [name], MD;  Location: [facility];  Service: General;  Laterality: N/A;  . CORONARY ARTERY BYPASS GRAFT N/A 07/02/2013   Procedure: CORONARY ARTERY BYPASS GRAFTING (CABG);  Surgeon: Ivin [name], MD;  Location: [place];  Service: Open Heart Surgery;  Laterality: N/A;  CABG x three, using left internal mammary artery and right leg greater saphenous vein harvested endoscopically  . ESOPHAGOGASTRODUODENOSCOPY N/A 04/11/2019   Procedure:  ESOPHAGOGASTRODUODENOSCOPY (EGD);  Surgeon: Jonathon Bellows, MD;  Location: [facility] ENDOSCOPY;  Service: Gastroenterology;  Laterality: N/A;  . ESOPHAGOGASTRODUODENOSCOPY (EGD) WITH PROPOFOL N/A 11/24/2015   Procedure: ESOPHAGOGASTRODUODENOSCOPY (EGD) WITH PROPOFOL;  Surgeon: [name], MD;  Location: [place];  Service: Endoscopy;  Laterality: N/A;  Diabetic - insulin  . ESOPHAGOGASTRODUODENOSCOPY (EGD) WITH PROPOFOL N/A 10/10/2019   Procedure: ESOPHAGOGASTRODUODENOSCOPY (EGD) WITH PROPOFOL;  Surgeon: Virgel Manifold, MD;  Location: [facility] ENDOSCOPY;  Service: Endoscopy;  Laterality: N/A;  . EYE SURGERY Bilateral    Cataract Extraction with IOL  . INTRAOPERATIVE TRANSESOPHAGEAL ECHOCARDIOGRAM N/A 07/02/2013   Procedure: INTRAOPERATIVE TRANSESOPHAGEAL ECHOCARDIOGRAM;  Surgeon: Ivin [name], MD;  Location: [place];  Service: Open Heart Surgery;  Laterality: N/A;  . PEG PLACEMENT N/A 03/03/2019   Procedure: PERCUTANEOUS ENDOSCOPIC GASTROSTOMY (PEG) PLACEMENT;  Surgeon: Virgel Manifold, MD;  Location: [facility];  Service: Endoscopy;  Laterality: N/A;  . PERIPHERAL VASCULAR CATHETERIZATION Right 12/06/2015   Procedure: Dialysis/Perma Catheter Insertion;  Surgeon: Katha Cabal, MD;  Location: [place] [facility];  Service: Cardiovascular;  Laterality: Right;  . PERIPHERAL VASCULAR THROMBECTOMY Right 09/28/2016   Procedure: Peripheral Vascular Thrombectomy;  Surgeon: Katha Cabal, MD;  Location: [place] [facility];  Service: Cardiovascular;  Laterality: Right;  . PERIPHERAL VASCULAR THROMBECTOMY Right 05/20/2018   Procedure: PERIPHERAL VASCULAR THROMBECTOMY;  Surgeon: Katha Cabal, MD;  Location: [place] [facility];  Service: Cardiovascular;  Laterality: Right;  . PORTA CATH REMOVAL N/A 06/01/2016   Procedure: Glori [name] Cath Removal;  Surgeon: Katha Cabal, MD;  Location: [place] [facility];  Service: Cardiovascular;  Laterality: N/A;  . THORACENTESIS Left 2015    Social History Social History   Tobacco Use  . Smoking status: Never Smoker  . Smokeless tobacco: Never Used  Vaping Use  . Vaping Use: Never  used  Substance Use Topics  . Alcohol use: No    Alcohol/week: 0.0 standard drinks  . Drug use: No   Family History Family History  Problem Relation Age of Onset  . COPD Mother   . Cancer Mother        Lung  . Pulmonary embolism Father   . Diabetes Father   . Diabetes Paternal Grandfather   . Heart disease Maternal Grandmother   . Colon cancer Neg Hx   . Colon polyps Neg Hx   . Esophageal cancer Neg Hx   . Pancreatic cancer Neg Hx   . Liver disease Neg Hx   No family history of bleeding or clotting disorders, autoimmune disease or porphyria  Allergies  Allergen Reactions  . Nsaids Other (See Comments)    Contraindicated due to kidney disease.  Judeth Cornfield Reductase Inhibitors   . Doxycycline Other (See Comments)    tremor   REVIEW OF SYSTEMS (Negative unless checked)  Constitutional: [] Weight loss  [] Fever  [] Chills Cardiac: [] Chest pain   [] Chest pressure   [] Palpitations   [] Shortness of breath when laying flat   [] Shortness of breath at rest   [x] Shortness of breath with exertion. Vascular:  [] Pain in legs with walking   [] Pain in legs at rest   [] Pain in legs when laying flat   [] Claudication   [] Pain in feet when walking  [] Pain in feet at rest  [] Pain in feet when laying flat   [] History of DVT   [] Phlebitis   [] Swelling in legs   [] Varicose veins   [] Non-healing ulcers Pulmonary:   [] Uses home oxygen   [] Productive cough   [] Hemoptysis   [] Wheeze  [] COPD   [] Asthma Neurologic:  [] Dizziness  [] Blackouts   [] Seizures   [] History of stroke   [] History of TIA  [] Aphasia   [] Temporary blindness   [] Dysphagia   [] Weakness or numbness in arms   [] Weakness or numbness in legs Musculoskeletal:  [] Arthritis   [] Joint swelling   [] Joint pain   [] Low back pain Hematologic:  [] Easy bruising  [] Easy bleeding   [] Hypercoagulable state    [x] Anemic  [] Hepatitis Gastrointestinal:  [] Blood in stool   [] Vomiting blood  [] Gastroesophageal reflux/heartburn   [] Difficulty swallowing. Genitourinary:  [x] Chronic kidney disease   [] Difficult urination  [] Frequent urination  [] Burning with urination   [] Blood in urine Skin:  [] Rashes   [] Ulcers   [] Wounds Psychological:  [] History of anxiety   []  History of major depression.  Physical Examination  Vitals:   10/12/19 0930 10/12/19 0945 10/12/19 1000 10/12/19 1015  BP: (!) 189/80 (!) 176/72 (!) 175/65 (!) 182/69  Pulse: 65 64 68 67  Resp: 15 14 11 14   Temp: 98.2 F (36.8 C)     TempSrc: Oral     SpO2: 96%     Weight:      Height:       Body mass index is 28.08 kg/m. Gen: WD/WN, NAD Head: [place]/AT, No temporalis wasting. Prominent temp pulse not noted. Ear/Nose/Throat: Hearing grossly intact, nares w/o erythema or drainage, oropharynx w/o Erythema/Exudate,  Eyes: Conjunctiva clear, sclera non-icteric Neck: Trachea midline.  No JVD.  Pulmonary:  Good air movement, respirations not labored, no use of accessory muscles.  Cardiac: RRR, normal S1, S2. Vascular:  Vessel Right Left  Radial Palpable Palpable  Ulnar Not Palpable Not Palpable  Brachial Palpable Palpable  Carotid Palpable, without bruit Palpable, without bruit   Left Upper Extremity: Good bruit and thrill. Examined in dialysis -  access seems to be working well.   Left Permcath: Intact, clean and dry. No sign of infection.   Gastrointestinal: soft, non-tender/non-distended. No guarding/reflex.  Musculoskeletal: M/S 5/5 throughout.  Extremities without ischemic changes.  No deformity or atrophy.  Neurologic: Sensation grossly intact in extremities.  Symmetrical.  Speech is fluent. Motor exam as listed above. Psychiatric: Judgment intact, Mood & affect appropriate for pt's clinical situation. Dermatologic: No rashes or ulcers noted.  No cellulitis or open wounds. Lymph : No Cervical, Axillary, or Inguinal  lymphadenopathy.  CBC Lab Results  Component Value Date   WBC 6.7 10/12/2019   HGB 7.9 (L) 10/12/2019   HCT 24.7 (L) 10/12/2019   MCV 91.1 10/12/2019   PLT 146 (L) 10/12/2019   BMET    Component Value Date/Time   NA 137 10/12/2019 0410   NA 138 06/23/2019 1600   NA 140 03/30/2014 1611   K 3.1 (L) 10/12/2019 0410   K 4.3 03/30/2014 1611   CL 98 10/12/2019 0410   CL 105 03/30/2014 1611   CO2 29 10/12/2019 0410   CO2 29 03/30/2014 1611   GLUCOSE 85 10/12/2019 0410   GLUCOSE 187 (H) 03/30/2014 1611   BUN 22 10/12/2019 0410   BUN 21 06/23/2019 1600   BUN 21 (H) 03/30/2014 1611   CREATININE 4.55 (H) 10/12/2019 0410   CREATININE 3.14 (H) 09/09/2015 1534   CALCIUM 8.3 (L) 10/12/2019 0410   CALCIUM 9.0 03/30/2014 1611   GFRNONAA 9 (L) 10/12/2019 0410   GFRNONAA 36 (L) 03/30/2014 1611   GFRNONAA >60 07/23/2013 0741   GFRAA 11 (L) 10/12/2019 0410   GFRAA 44 (L) 03/30/2014 1611   GFRAA >60 07/23/2013 0741   Estimated Creatinine Clearance: 12.2 mL/min (A) (by C-G formula based on SCr of 4.55 mg/dL (H)).  COAG Lab Results  Component Value Date   INR 1.4 (H) 07/07/2019   INR 1.1 03/20/2019   INR 1.4 (H) 03/13/2019   Radiology CT HEAD WO CONTRAST  Result Date: 10/11/2019 CLINICAL DATA:  Mental status change unknown cause EXAM: CT HEAD WITHOUT CONTRAST TECHNIQUE: Contiguous axial images were obtained from the base of the skull through the vertex without intravenous contrast. COMPARISON:  MRI head 03/06/2019 FINDINGS: Brain: Mild generalized atrophy. Negative for hydrocephalus. Patchy hypodensity throughout the cerebral white matter bilaterally and also in the pons similar to the prior MRI. Negative for acute infarct, hemorrhage, mass Vascular: Diffuse vascular enhancement due to CTA performed earlier today. Skull: Negative Sinuses/Orbits: Paranasal sinuses clear. Bilateral cataract extraction. Other: None IMPRESSION: Atrophy and chronic ischemic change in the pons and white matter.  No acute abnormality. Electronically Signed   By: Franchot Gallo M.D.   On: 10/11/2019 13:50   CT ABDOMEN PELVIS W CONTRAST  Result Date: 10/08/2019 CLINICAL DATA:  Acute abdominal pain. EXAM: CT ABDOMEN AND PELVIS WITH CONTRAST TECHNIQUE: Multidetector CT imaging of the abdomen and pelvis was performed using the standard protocol following bolus administration of intravenous contrast. CONTRAST:  184mL OMNIPAQUE IOHEXOL 300 MG/ML  SOLN COMPARISON:  June 29, 2019. FINDINGS: Lower chest: Small right pleural effusion is noted with adjacent subsegmental atelectasis. Left basilar atelectasis is noted as well. Hepatobiliary: No focal liver abnormality is seen. Status post cholecystectomy. No biliary dilatation. Pancreas: Unremarkable. No pancreatic ductal dilatation or surrounding inflammatory changes. Spleen: Normal in size without focal abnormality. Adrenals/Urinary Tract: Adrenal glands appear normal. Bilateral renal atrophy is noted. No hydronephrosis or renal obstruction is noted. No renal or ureteral calculi are noted. Mild bladder wall thickening  is noted which may represent cystitis. Stomach/Bowel: Stomach is within normal limits. Appendix appears normal. No evidence of bowel wall thickening, distention, or inflammatory changes. Vascular/Lymphatic: Aortic atherosclerosis. No enlarged abdominal or pelvic lymph nodes. Reproductive: Uterus and bilateral adnexa are unremarkable. Other: No abdominal wall hernia or abnormality. No abdominopelvic ascites. Musculoskeletal: No acute or significant osseous findings. IMPRESSION: 1. Small right pleural effusion is noted with adjacent subsegmental atelectasis. Left basilar atelectasis is noted as well. 2. Bilateral renal atrophy is noted. 3. Mild bladder wall thickening is noted which may represent cystitis. 4. No other significant abnormality seen in the abdomen or pelvis. Aortic Atherosclerosis (ICD10-I70.0). Electronically Signed   By: Marijo Conception M.D.   On:  10/08/2019 13:35   DG Chest Portable 1 View  Result Date: 10/08/2019 CLINICAL DATA:  Cough and short of breath.  Dialysis patient. EXAM: PORTABLE CHEST 1 VIEW COMPARISON:  06/29/2019 FINDINGS: Cardiac enlargement. Prior CABG. Left jugular central venous catheter tip in the proximal SVC unchanged. Pulmonary vascular congestion. No edema. Left pleural thickening unchanged consistent with pleural scarring as noted on CT. Mild left lower lobe atelectasis. IMPRESSION: Pulmonary vascular congestion without edema. No change in small left   pleural thickening. Electronically Signed   By: Franchot Gallo M.D.   On: 10/08/2019 13:52   CT Angio Abd/Pel w/ and/or w/o  Result Date: 10/11/2019 CLINICAL DATA:  64 year old with left upper quadrant abdominal pain. EXAM: CT ANGIOGRAPHY ABDOMEN AND PELVIS WITH CONTRAST AND WITHOUT CONTRAST TECHNIQUE: Multidetector CT imaging of the abdomen and pelvis was performed using the standard protocol during bolus administration of intravenous contrast. Multiplanar reconstructed images and MIPs were obtained and reviewed to evaluate the vascular anatomy. CONTRAST:  193mL OMNIPAQUE IOHEXOL 350 MG/ML SOLN COMPARISON:  CT abdomen and pelvis 10/08/2019 FINDINGS: VASCULAR Aorta: Atherosclerotic disease in the abdominal aorta without aneurysm or dissection. No aortic stenosis. Celiac: Celiac trunk is widely patent without aneurysm, dissection or significant stenosis. Diffuse atherosclerotic disease in the splenic artery. There may be a small splenic artery aneurysm near the hilum which is calcified measuring roughly 8 mm. SMA: Patent without evidence of aneurysm, dissection, vasculitis or significant stenosis. Incidentally, there is a replaced right hepatic artery. Renals: Atherosclerotic plaque involving the proximal left renal artery without significant stenosis. Right renal artery is widely patent without significant stenosis. No evidence for aneurysm or dissection involving the renal  arteries. IMA: Patent Inflow: Mild atherosclerotic disease involving the bilateral iliac arteries. Common, internal and external iliac arteries are patent bilaterally. No evidence for aneurysm, dissection or significant stenosis in the iliac arteries. Proximal Outflow: Proximal femoral arteries are patent bilaterally. Veins: Portal venous system is patent. No gross abnormality to the iliac veins or IVC. There is contrast refluxing into the hepatic veins which can be associated with increased right heart pressures. Review of the MIP images confirms the above findings. NON-VASCULAR Lower chest: Right pleural effusion has slightly enlarged in size. Compressive atelectasis in both lower lobes particularly on the left side. The volume loss in left lower lobe is similar to the previous examination. Hepatobiliary: Cholecystectomy. Again noted is mild dilatation of the biliary system. Common bile duct measures roughly 9 mm and stable. Mild biliary dilatation may be secondary to the cholecystectomy. No discrete liver lesion. Pancreas: Minimal stranding or fluid between the duodenum and the pancreas on sequence 6 image 64. Subtle edema around the pancreatic head and uncinate process on sequence 6, image 84 is probably new. No significant pancreatic duct dilatation. Spleen: Again noted is  a small amount of perisplenic ascites. Adrenals/Urinary Tract: Normal appearance of the adrenal glands. Again noted are bilateral atrophic kidneys and compatible with history of end-stage renal disease. No hydronephrosis and no suspicious renal lesion. Again noted is diffuse wall thickening in the urinary bladder. Small foci of high density in the left renal pelvis could represent tiny nonobstructive stones. Stomach/Bowel: Stomach is within normal limits. Appendix appears normal. No evidence of bowel wall thickening, distention, or inflammatory changes. Lymphatic: Small lymph nodes in the upper abdomen. Overall, no significant lymph node  enlargement in the abdomen or pelvis. Reproductive: Uterus and bilateral adnexa are unremarkable. Other: Slightly increased amount of pelvic ascites. Overall, the pelvic ascites is small. Diffuse subcutaneous edema. Musculoskeletal: No acute bone abnormality. IMPRESSION: VASCULAR 1. Atherosclerotic disease in the abdomen and pelvis without significant stenosis. Aortic Atherosclerosis (ICD10-I70.0). 2. 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 3. Visceral arteries are patent. No significant visceral artery stenosis. NON-VASCULAR 1. Evidence for a fluid overload state. Slightly increased abdominal and pelvic ascites. Mild edema and stranding around the pancreas. Diffuse subcutaneous edema. Evidence for increased right heart pressures. Findings may be related to underlying cardiac disease. Edema and fluid around the pancreas is nonspecific and recommend correlation with amylase and lipase. 2. Diffuse wall thickening involving the urinary bladder. Findings are similar to the recent examination. Findings could be related to cystitis. 3. Slightly enlarged right pleural effusion. Persistent volume loss and consolidation at the lung bases, particularly at the left lung base. 4. Probable tiny nonobstructive stones in left renal pelvis. Electronically Signed   By: Markus Daft M.D.   On: 10/11/2019 17:10   Assessment/Plan 1.  Complication dialysis device with thrombosis AV access:   The patient has an extremity access that is functioning well. Therefore, the patient will undergo removal of the tunneled catheter under local anesthesia.  The risks and benefits were described to the patient.  All questions were answered.  The patient agrees to proceed with angiography and intervention. Potassium will be drawn to ensure that it is an appropriate level prior to performing intervention.  2.  End-stage renal disease requiring hemodialysis:  Patient will continue dialysis therapy  without further interruption if a successful intervention is not achieved then a tunneled catheter will be placed. Dialysis has already been arranged.  3.  Hypertension:  Patient will continue medical management; nephrology is following no changes in oral medications.  4. Diabetes mellitus:  Glucose will be monitored and oral medications been held this morning once the patient has undergone the patient's procedure po intake will be reinitiated and again Accu-Cheks will be used to assess the blood glucose level and treat as needed. The patient will be restarted on the patient's usual hypoglycemic regime  Discussed with Dr. Mayme Genta, PA-C  10/12/2019 10:31 AM

## 2019-10-13 ENCOUNTER — Encounter
Admission: EM | Disposition: A | Discharge status: Home / Self Care | Payer: Self-pay | Source: Home / Self Care | Attending: Internal Medicine

## 2019-10-13 ENCOUNTER — Encounter: Payer: Self-pay | Admitting: Internal Medicine

## 2019-10-13 DIAGNOSIS — R4189 Other symptoms and signs involving cognitive functions and awareness: Secondary | ICD-10-CM

## 2019-10-13 DIAGNOSIS — E1122 Type 2 diabetes mellitus with diabetic chronic kidney disease: Secondary | ICD-10-CM

## 2019-10-13 DIAGNOSIS — I160 Hypertensive urgency: Secondary | ICD-10-CM

## 2019-10-13 DIAGNOSIS — I132 Hypertensive heart and chronic kidney disease with heart failure and with stage 5 chronic kidney disease, or end stage renal disease: Secondary | ICD-10-CM | POA: Diagnosis not present

## 2019-10-13 DIAGNOSIS — D631 Anemia in chronic kidney disease: Secondary | ICD-10-CM

## 2019-10-13 DIAGNOSIS — Z7189 Other specified counseling: Secondary | ICD-10-CM

## 2019-10-13 DIAGNOSIS — E1140 Type 2 diabetes mellitus with diabetic neuropathy, unspecified: Secondary | ICD-10-CM

## 2019-10-13 DIAGNOSIS — I2510 Atherosclerotic heart disease of native coronary artery without angina pectoris: Secondary | ICD-10-CM

## 2019-10-13 DIAGNOSIS — Z992 Dependence on renal dialysis: Secondary | ICD-10-CM

## 2019-10-13 DIAGNOSIS — E039 Hypothyroidism, unspecified: Secondary | ICD-10-CM

## 2019-10-13 DIAGNOSIS — F329 Major depressive disorder, single episode, unspecified: Secondary | ICD-10-CM

## 2019-10-13 DIAGNOSIS — I5032 Chronic diastolic (congestive) heart failure: Secondary | ICD-10-CM

## 2019-10-13 DIAGNOSIS — I82622 Acute embolism and thrombosis of deep veins of left upper extremity: Secondary | ICD-10-CM

## 2019-10-13 DIAGNOSIS — B372 Candidiasis of skin and nail: Secondary | ICD-10-CM

## 2019-10-13 DIAGNOSIS — R1312 Dysphagia, oropharyngeal phase: Secondary | ICD-10-CM

## 2019-10-13 DIAGNOSIS — Z515 Encounter for palliative care: Secondary | ICD-10-CM

## 2019-10-13 DIAGNOSIS — E785 Hyperlipidemia, unspecified: Secondary | ICD-10-CM

## 2019-10-13 DIAGNOSIS — N186 End stage renal disease: Secondary | ICD-10-CM

## 2019-10-13 DIAGNOSIS — I4891 Unspecified atrial fibrillation: Secondary | ICD-10-CM

## 2019-10-13 DIAGNOSIS — Z7901 Long term (current) use of anticoagulants: Secondary | ICD-10-CM

## 2019-10-13 DIAGNOSIS — Z951 Presence of aortocoronary bypass graft: Secondary | ICD-10-CM

## 2019-10-13 HISTORY — PX: DIALYSIS/PERMA CATHETER REMOVAL: CATH118289

## 2019-10-13 LAB — RENAL FUNCTION PANEL
Albumin: 2.6 g/dL — ABNORMAL LOW (ref 3.5–5.0)
Anion gap: 15 (ref 5–15)
BUN: 13 mg/dL (ref 8–23)
CO2: 26 mmol/L (ref 22–32)
Calcium: 8.2 mg/dL — ABNORMAL LOW (ref 8.9–10.3)
Chloride: 95 mmol/L — ABNORMAL LOW (ref 98–111)
Creatinine, Ser: 3.29 mg/dL — ABNORMAL HIGH (ref 0.44–1.00)
GFR calc Af Amer: 16 mL/min — ABNORMAL LOW (ref >60)
GFR calc non Af Amer: 14 mL/min — ABNORMAL LOW (ref >60)
Glucose, Bld: 71 mg/dL (ref 70–99)
Phosphorus: 1.8 mg/dL — ABNORMAL LOW (ref 2.5–4.6)
Potassium: 3.4 mmol/L — ABNORMAL LOW (ref 3.5–5.1)
Sodium: 136 mmol/L (ref 135–145)

## 2019-10-13 LAB — SURGICAL PATHOLOGY

## 2019-10-13 LAB — OCCULT BLOOD X 1 CARD TO LAB, STOOL: Fecal Occult Bld: NEGATIVE

## 2019-10-13 SURGERY — DIALYSIS/PERMA CATHETER REMOVAL

## 2019-10-13 MED ORDER — LIDOCAINE-EPINEPHRINE (PF) 1 %-1:200000 IJ SOLN
INTRAMUSCULAR | Status: DC | PRN
  Administered 2019-10-13: 20 mL

## 2019-10-13 MED ORDER — ONDANSETRON HCL 4 MG/2ML IJ SOLN
INTRAMUSCULAR | Status: AC
  Administered 2019-10-13: 4 mg via INTRAVENOUS
  Filled 2019-10-13: qty 2

## 2019-10-13 MED ORDER — APIXABAN 2.5 MG PO TABS
2.5000 mg | ORAL_TABLET | Freq: Two times a day (BID) | ORAL | Status: DC
  Administered 2019-10-13: 2.5 mg via ORAL
  Filled 2019-10-13: qty 1

## 2019-10-13 MED ORDER — ACETAMINOPHEN 325 MG PO TABS
650.0000 mg | ORAL_TABLET | Freq: Two times a day (BID) | ORAL | Status: DC | PRN
  Administered 2019-10-13: 650 mg via ORAL
  Filled 2019-10-13: qty 2

## 2019-10-13 MED ORDER — SODIUM CHLORIDE 0.9 % IV SOLN
INTRAVENOUS | Status: DC | PRN
  Administered 2019-10-13: 250 mL via INTRAVENOUS

## 2019-10-13 MED ORDER — NEPRO/CARBSTEADY PO LIQD
237.0000 mL | Freq: Two times a day (BID) | ORAL | Status: DC
  Administered 2019-10-13 – 2019-10-15 (×3): 237 mL via ORAL

## 2019-10-13 SURGICAL SUPPLY — 4 items
CHLORAPREP W/TINT 26 (MISCELLANEOUS) ×6 IMPLANT
FORCEPS HALSTEAD CVD 5IN STRL (INSTRUMENTS) ×3 IMPLANT
SCALPEL PROTECTED #11 DISP (BLADE) ×3 IMPLANT
TRAY LACERAT/PLASTIC (MISCELLANEOUS) ×3 IMPLANT

## 2019-10-13 NOTE — Progress Notes (Signed)
Patient began bleeding at vascular site post perm cath removal. Held pressure and contacted vascular team.   Fuller Mandril, RN

## 2019-10-13 NOTE — Consult Note (Addendum)
Consultation Note Date: 10/13/2019   Patient Name: Jamie Arias  DOB: February 25, 1956  MRN: [id number]  Age / Sex: 64 y.o., female  PCP: [name], MD Referring Physician: Loletha Grayer, MD  Reason for Consultation: Establishing goals of care and Psychosocial/spiritual support  HPI/Patient Profile: 64 y.o. female  with past medical history of ESRD on HD since 2017, HTN/HLD, CAD, DM, GERD, history of MI, hypothyroid, obesity, poor functional status, anemia of chronic disease, arthritis, admitted on 10/08/2019 with epigastric abdominal pain with nausea, evaluated by GI no interventions, acute cystitis with E. coli treated with Rocephin.   Clinical Assessment and Goals of Care: Jamie Arias is resting quietly in bed.  She does not respond when I squeeze her hand, but does open her eyes when I rub on her chest.  She is able to tell me her name, but gives a long pause when I ask where we are.  I reassure her that we are in [facility] and that she is safe.  I am not sure that she is able to make her basic needs known.  There is no family at bedside at this time.  Perma cath has been removed today, using fistula for HD.   I returned to Jamie Arias room later in the day to have a face-to-face conference with her husband of 16 years, [name].  Mark cannot talk about Mrs. Lekas chronic illness burden, he shares that she has had a very rough last 6 months to 1 year.  He states that she is home now, but he provides for her care.  He states that he does not believe that she would want to go to rehab again, but instead would want home health services to continue at home.  We talked about poor mobility, and I share that Jamie Arias must be able to sit up in the chair for dialysis, and if she cannot, things would be very different.  We talked about CODE STATUS, at share that Hitomi told me this morning she would not  want life support.  I share with [name] that I encouraged her to share this with him.  I shared that at some point, Emmalene may decide that she does not want to continue with all of this treatment.  Mark states agreement.  I share that if she decides to stop dialysis she would only have a few weeks, he states he is aware of this.  We talked about outpatient palliative services, talking about the "what if's and maybe's".  I shared that this may be very helpful in continuing discussions for CODE STATUS, rehospitalization, what matters to [place].  [name] is agreeable to outpatient palliative services at this time.  Conference with bedside nursing staff related to patient condition, needs, goals of care.   HCPOA  NEXT OF KIN -husband of 82 years, Isebella Upshur    SUMMARY OF RECOMMENDATIONS   At this point full scope/full code. PMT to continue CODE STATUS discussions with patient and husband Anticipate returning home with home health  services already in place   Code Status/Advance Care Planning:  Full code -Jamie Arias is confused this morning, but tells me she would not want life support.  This was discussed with her husband.  CODE STATUS discussions need to continue with Jamie Arias and her husband once they return home.  Symptom Management:   Per hospitalist, no additional needs at this time.  Palliative Prophylaxis:   Frequent Pain Assessment, Oral Care and Turn Reposition  Additional Recommendations (Limitations, Scope, Preferences):  Full Scope Treatment  Psycho-social/Spiritual:   Desire for further Chaplaincy support:no  Additional Recommendations: Caregiving  Support/Resources and Education on Hospice  Prognosis:   Unable to determine, guarded based on 4 hospital visits in the last 6 months, poor functional status, chronic illness burden.  6 months or less would not be surprising based on chronic illness burden.  If Lavina were to stop HD, it would be 2 weeks or less.  Discharge  Planning: Anticipate home with home health/PT services already in place.  Outpatient palliative services to follow.      Primary Diagnoses: Present on Admission:  Nausea vomiting and diarrhea   I have reviewed the medical record, interviewed the patient and family, and examined the patient. The following aspects are pertinent.  Past Medical History:  Diagnosis Date   Anemia    Anginal pain (HCC)    Arthritis    upper back and neck    Chronic diastolic CHF (congestive heart failure) ([place])    a. Due to ischemic cardiomyopathy. EF as low as 35%, improved to normal s/p CABG; b. echo 07/06/13: EF 55-60%, no RWMAs, mod TR, trivial pericardial effusion not c/w tamponade physiology;  c. 10/2015 Echo: EF 65%, Gr1 DD, triv AI, mild MR, mildly dil LA, mod TR, PASP 7mmHg.   Coronary artery disease    a. NSTEMI 06/2013; b.cath: severe three-vessel CAD w/ EF 30% & mild-mod MR; c. s/p 3 vessel CABG 07/02/13 (LIMA-LAD, SVG-OM, and SVG-RPDA);  d. 10/2015 MV: no ischemia/infarct.   Diabetes mellitus without complication ([place])    Diabetic neuropathy ([place])    Dialysis patient ([place])    MWF   ESRD (end stage renal disease) ([place])    a. 12/2015 initiated - mwf dialysis.   GERD (gastroesophageal reflux disease)    Hyperlipidemia    Hypertension    Hypothyroidism    Myocardial infarction ([place]) 2014   Neuropathy    Pleural effusion 2015   Pulmonary hypertension (HCC)    Renal insufficiency    Wears dentures    full lower   Social History   Socioeconomic History   Marital status: Married    Spouse name: Not on file   Number of children: Not on file   Years of education: Not on file   Highest education level: Not on file  Occupational History   Occupation: works for Building [employer]  Tobacco Use   Smoking status: Never Smoker   Smokeless tobacco: Never Used  Scientific laboratory technician Use: Never used  Substance and Sexual Activity   Alcohol use: No    Alcohol/week: 0.0  standard drinks   Drug use: No   Sexual activity: Not on file  Other Topics Concern   Not on file  Social History Narrative   Patient lives at home with her husband. Patient has 4 adult children.   Social Determinants of Health   Financial Resource Strain:    Difficulty of Paying Living Expenses:   Food Insecurity:    Worried About  Running Out of Food in the Last Year:    Arboriculturist in the Last Year:   Transportation Needs:    Film/video editor (Medical):    Lack of Transportation (Non-Medical):   Physical Activity:    Days of Exercise per Week:    Minutes of Exercise per Session:   Stress:    Feeling of Stress :   Social Connections:    Frequency of Communication with Friends and Family:    Frequency of Social Gatherings with Friends and Family:    Attends Religious Services:    Active Member of Clubs or Organizations:    Attends Music therapist:    Marital Status:    Family History  Problem Relation Age of Onset   COPD Mother    Cancer Mother        Lung   Pulmonary embolism Father    Diabetes Father    Diabetes Paternal Grandfather    Heart disease Maternal Grandmother    Colon cancer Neg Hx    Colon polyps Neg Hx    Esophageal cancer Neg Hx    Pancreatic cancer Neg Hx    Liver disease Neg Hx    Scheduled Meds:  amiodarone  200 mg Oral Daily   apixaban  2.5 mg Oral BID   Chlorhexidine Gluconate Cloth  6 each Topical Q0600   epoetin (EPOGEN/PROCRIT) injection  10,000 Units Intravenous Q M,W,F-HD   feeding supplement (NEPRO CARB STEADY)  237 mL Oral BID BM   FLUoxetine  20 mg Oral Daily   folic acid  1 mg Oral Daily   hydrALAZINE  10 mg Oral BID   levothyroxine  88 mcg Oral Q0600   multivitamin  1 tablet Oral QHS   naLOXone (NARCAN)  injection  0.4 mg Intravenous Once   pantoprazole (PROTONIX) IV  40 mg Intravenous Q12H   sodium chloride flush  3 mL Intravenous Once   traZODone  50 mg Oral  QHS   Continuous Infusions:  sodium chloride     sodium chloride     fluconazole (DIFLUCAN) IV 100 mg (10/12/19 1627)   PRN Meds:.sodium chloride, sodium chloride, acetaminophen, alteplase, fluticasone, heparin, lidocaine (PF), lidocaine-prilocaine, melatonin, naLOXone (NARCAN)  injection, nitroGLYCERIN, ondansetron **OR** ondansetron (ZOFRAN) IV, pentafluoroprop-tetrafluoroeth, promethazine Medications Prior to Admission:  Prior to Admission medications   Medication Sig Start Date End Date Taking? Authorizing Provider  atorvastatin (LIPITOR) 40 MG tablet Take 1 tablet (40 mg total) by mouth daily. 08/28/19  Yes [name], MD  ELIQUIS 5 MG TABS tablet Take 1 tablet (5 mg total) by mouth 2 (two) times daily. 09/14/19  Yes [name], MD  FLUoxetine (PROZAC) 20 MG capsule Take 1 capsule (20 mg total) by mouth daily. 06/30/19  Yes [name], MD  levothyroxine (SYNTHROID) 88 MCG tablet Take 1 tablet (88 mcg total) by mouth daily before breakfast. 08/28/19  Yes [name], MD  lidocaine-prilocaine (EMLA) cream Apply 1 application topically as needed.   Yes [provider]  Melatonin 3 MG TABS Take 3 mg by mouth at bedtime as needed (sleep).    Yes [provider]  multivitamin (RENA-VIT) TABS tablet Take 1 tablet by mouth at bedtime. 09/09/18  Yes Hosie Poisson, MD  nitroGLYCERIN (NITROSTAT) 0.4 MG SL tablet DISSOLVE ONE TABLET UNDER THE TONGUE EVERY 5 MINUTES AS NEEDED FOR CHEST PAIN.  DO NOT EXCEED A TOTAL OF 3 DOSES IN 15 MINUTES Patient taking differently: Place 0.4 mg  under the tongue every 5 (five) minutes as needed for chest pain. DISSOLVE ONE TABLET UNDER THE TONGUE EVERY 5 MINUTES AS NEEDED FOR CHEST PAIN.  DO NOT EXCEED A TOTAL OF 3 DOSES IN 15 MINUTES 08/18/18  Yes Wellington Hampshire, MD  omeprazole (PRILOSEC) 20 MG capsule Take 20 mg by mouth daily.   Yes [provider]  ondansetron (ZOFRAN-ODT) 4 MG disintegrating tablet Take 1  tablet (4 mg total) by mouth every 8 (eight) hours as needed for nausea or vomiting. Patient needs appointment with PCP to receive further refills. 08/07/19  Yes [name], MD  PACERONE 200 MG tablet TAKE 1 TABLET BY MOUTH ONCE DAILY. PATIENT NEEDS APPOINTMENT WITH PCP FOR FURTHER REFILLS Patient taking differently: Take 200 mg by mouth daily.  09/21/19  Yes [name], MD  traZODone (DESYREL) 50 MG tablet TAKE 1 TABLET BY MOUTH AT BEDTIME. PATIENT NEEDS APPOINTMENT WITH PCP FOR FURTHER REFILLS Patient taking differently: Take 50 mg by mouth at bedtime.  09/14/19  Yes [name], MD  vitamin C (VITAMIN C) 500 MG tablet Take 1 tablet (500 mg total) by mouth daily. 09/10/18  Yes Hosie Poisson, MD  acetaminophen (TYLENOL) 325 MG tablet Take 650 mg by mouth daily as needed for moderate pain or headache.     [provider]  fluticasone (FLONASE) 50 MCG/ACT nasal spray Place 2 sprays into both nostrils daily. Patient taking differently: Place 2 sprays into both nostrils daily as needed for allergies or rhinitis.  09/17/17   [name], MD  folic acid (FOLVITE) 1 MG tablet Take 1 tablet (1 mg total) by mouth daily. 04/21/19   Sidney Ace, MD  gabapentin (NEURONTIN) 100 MG capsule Take 100 mg by mouth 3 (three) times daily.    [provider]  hydrALAZINE (APRESOLINE) 10 MG tablet Take 1 tablet (10 mg total) by mouth in the morning and at bedtime. 06/23/19   Marrianne Mood D, PA-C  hydrocortisone cream 1 % Apply topically as needed for itching. 04/20/19   Sidney Ace, MD   Allergies  Allergen Reactions   Nsaids Other (See Comments)    Contraindicated due to kidney disease.   5-Alpha Reductase Inhibitors    Doxycycline Other (See Comments)    tremor   Review of Systems  Unable to perform ROS: Mental status change    Physical Exam Vitals and nursing note reviewed.  Constitutional:      General: She is not in acute distress.     Appearance: She is obese. She is ill-appearing.     Comments: Will make an somewhat keep eye contact, appears obese, chronically ill and frail  HENT:     Head: Normocephalic and atraumatic.  Cardiovascular:     Rate and Rhythm: Normal rate.  Pulmonary:     Effort: Pulmonary effort is normal. No respiratory distress.  Abdominal:     Comments: Soft rounded abdomen  Skin:    General: Skin is warm and dry.  Neurological:     Comments: Oriented to self only  Psychiatric:        Mood and Affect: Mood is not anxious.     Vital Signs: BP (!) 160/50 (BP Location: Left Leg)    Pulse 68    Temp 98.4 F (36.9 C)    Resp 16    Ht 5\' 4"  (1.626 m)    Wt 74.2 kg    SpO2 100%    BMI 28.08 kg/m  Pain  Scale: 0-10 POSS *See Group Information*: 1-Acceptable,Awake and alert Pain Score: 0-No pain   SpO2: SpO2: 100 % O2 Device:SpO2: 100 % O2 Flow Rate: .O2 Flow Rate (L/min): 2 L/min  IO: Intake/output summary:   Intake/Output Summary (Last 24 hours) at 10/13/2019 1134 Last data filed at 10/13/2019 0900 Gross per 24 hour  Intake 120 ml  Output 500 ml  Net -380 ml    LBM: Last BM Date: 10/11/19 Baseline Weight: Weight: 77.6 kg Most recent weight: Weight: 74.2 kg     Palliative Assessment/Data:   Flowsheet Rows     Most Recent Value  Intake Tab  Referral Department Hospitalist  Unit at Time of Referral Med/Surg Unit  Palliative Care Primary Diagnosis Nephrology  Date Notified 10/11/19  Palliative Care Type Return patient Palliative Care  Reason for referral Clarify Goals of Care  Date of Admission 10/08/19  Date first seen by Palliative Care 10/13/19  # of days Palliative referral response time 2 Day(s)  # of days IP prior to Palliative referral 3  Clinical Assessment  Palliative Performance Scale Score 20%  Pain Max last 24 hours Not able to report  Pain Min Last 24 hours Not able to report  Dyspnea Max Last 24 Hours Not able to report  Dyspnea Min Last 24 hours Not able to  report  Psychosocial & Spiritual Assessment  Palliative Care Outcomes      Time In: 0920 Time Out: 1030 Time Total: 70 minutes  Greater than 50%  of this time was spent counseling and coordinating care related to the above assessment and plan.  Signed by: Drue Novel, NP   Please contact Palliative Medicine Team phone at [phone] for questions and concerns.  For individual provider: See Shea Evans

## 2019-10-13 NOTE — Progress Notes (Signed)
[name], female   DOB: 02-25-1956, 64 y.o.   MRN: [id number] [facility] PROGRESS NOTE  Jamie Arias:[id number] DOB: 02-03-1956 DOA: 10/08/2019 PCP: [name], MD  HPI/Subjective: The patient when I saw her today he was feeling a little bit better.  Still has not eaten much.  Having less abdominal pain.  The last 2 afternoons in a row I got called with rapid responses.  So far today have not got called with a rapid response.  Objective: Vitals:   10/13/19 1356 10/13/19 1447  BP: (!) 192/54 (!) 177/46  Pulse: 64 68  Resp: 17 17  Temp: 98.3 F (36.8 C)   SpO2: 99%     Intake/Output Summary (Last 24 hours) at 10/13/2019 1609 Last data filed at 10/13/2019 1023 Gross per 24 hour  Intake 180 ml  Output --  Net 180 ml   Filed Weights   10/10/19 0843 10/10/19 1300 10/13/19 1356  Weight: 29.4 kg 74.2 kg 74.2 kg    ROS: Review of Systems  Respiratory: Negative for shortness of breath.   Cardiovascular: Negative for chest pain.  Gastrointestinal: Positive for abdominal pain and nausea. Negative for constipation, diarrhea and vomiting.   Exam: Physical Exam HENT:     Nose: No mucosal edema.     Mouth/Throat:     Pharynx: No oropharyngeal exudate.  Eyes:     General: Lids are normal.     Conjunctiva/sclera: Conjunctivae normal.     Pupils: Pupils are equal, round, and reactive to light.  Cardiovascular:     Rate and Rhythm: Normal rate and regular rhythm.     Heart sounds: Normal heart sounds, S1 normal and S2 normal.  Pulmonary:     Breath sounds: No decreased breath sounds, wheezing, rhonchi or rales.  Abdominal:     Palpations: Abdomen is soft.     Tenderness: There is abdominal tenderness in the epigastric area.  Musculoskeletal:     Right lower leg: No swelling.     Left lower leg: No swelling.  Skin:    General: Skin is warm.     Findings: No rash.  Neurological:     Mental Status: She is alert.     Comments: Today answers  questions appropriately.       Data Reviewed: Basic Metabolic Panel: Recent Labs  Lab 10/08/19 1111 10/09/19 0431 10/09/19 0847 10/10/19 0703 10/12/19 0410 10/13/19 0613  NA 141 138  --  139 137 136  K 4.4 3.4*  --  3.3* 3.1* 3.4*  CL 94* 97*  --  98 98 95*  CO2 29 30  --  26 29 26   GLUCOSE 192* 121*  --  128* 85 71  BUN 28* 14  --  11 22 13   CREATININE 5.46* 3.09*  --  2.53* 4.55* 3.29*  CALCIUM 9.5 8.6*  --  8.7* 8.3* 8.2*  PHOS  --   --  2.6  --   --  1.8*   Liver Function Tests: Recent Labs  Lab 10/08/19 1111 10/12/19 0410 10/13/19 0613  AST 93* 55*  --   ALT 17 14  --   ALKPHOS 50 41  --   BILITOT 2.2* 1.1  --   PROT 7.4 5.5*  --   ALBUMIN 3.1* 2.5* 2.6*   Recent Labs  Lab 10/08/19 1111 10/12/19 0410  LIPASE 17 170*   CBC: Recent Labs  Lab 10/08/19 1111 10/09/19 0431 10/10/19 0703 10/11/19 0542 10/12/19 0410  WBC  4.8 5.7 5.7 5.9 6.7  HGB 8.0* 7.9* 7.7* 8.2* 7.9*  HCT 25.2* 24.2* 24.7* 25.5* 24.7*  MCV 93.3 91.7 93.6 89.5 91.1  PLT 158 145* 148* 154 146*  BNP (last 3 results) Recent Labs    03/01/19 0429  BNP 720.0*   CBG: Recent Labs  Lab 10/10/19 0853 10/11/19 1251 10/12/19 1357  GLUCAP 118* 114* 74    Recent Results (from the past 240 hour(s))  SARS Coronavirus 2 by RT PCR (hospital order, performed in [facility] hospital lab) Nasopharyngeal Nasopharyngeal Swab     Status: None   Collection Time: 10/08/19  2:29 PM   Specimen: Nasopharyngeal Swab  Result Value Ref Range Status   SARS Coronavirus 2 NEGATIVE NEGATIVE Final    Comment: (NOTE) SARS-CoV-2 target nucleic acids are NOT DETECTED.  The SARS-CoV-2 RNA is generally detectable in upper and lower respiratory specimens during the acute phase of infection. The lowest concentration of SARS-CoV-2 viral copies this assay can detect is 250 copies / mL. A negative result does not preclude SARS-CoV-2 infection and should not be used as the sole basis for treatment or  other patient management decisions.  A negative result may occur with improper specimen collection / handling, submission of specimen other than nasopharyngeal swab, presence of viral mutation(s) within the areas targeted by this assay, and inadequate number of viral copies (<250 copies / mL). A negative result must be combined with clinical observations, patient history, and epidemiological information.  Fact Sheet for Patients:   StrictlyIdeas.no  Fact Sheet for Healthcare Providers: BankingDealers.co.za  This test is not yet approved or  cleared by the [place] FDA and has been authorized for detection and/or diagnosis of SARS-CoV-2 by FDA under an Emergency Use Authorization (EUA).  This EUA will remain in effect (meaning this test can be used) for the duration of the COVID-19 declaration under Section 564(b)(1) of the Act, 21 U.S.C. section 360bbb-3(b)(1), unless the authorization is terminated or revoked sooner.  Performed at [facility], [address] [place], [name] [address]   Urine Culture     Status: Abnormal   Collection Time: 10/08/19  2:31 PM   Specimen: Urine, Random  Result Value Ref Range Status   Specimen Description   Final    URINE, RANDOM Performed at [facility], [address] [place], [place] [address]    Special Requests   Final    NONE Performed at [facility], [address] [place],  [address]    Culture >=100,000 COLONIES/mL ESCHERICHIA COLI (A)  Final   Report Status 10/10/2019 FINAL  Final   Organism ID, Bacteria ESCHERICHIA COLI (A)  Final      Susceptibility   Escherichia coli - MIC*    AMPICILLIN >=32 RESISTANT Resistant     CEFAZOLIN <=4 SENSITIVE Sensitive     CEFTRIAXONE <=0.25 SENSITIVE Sensitive     CIPROFLOXACIN INTERMEDIATE Intermediate     GENTAMICIN <=1 SENSITIVE Sensitive     IMIPENEM <=0.25 SENSITIVE Sensitive     NITROFURANTOIN  <=16 SENSITIVE Sensitive     TRIMETH/SULFA >=320 RESISTANT Resistant     AMPICILLIN/SULBACTAM 16 INTERMEDIATE Intermediate     PIP/TAZO <=4 SENSITIVE Sensitive     * >=100,000 COLONIES/mL ESCHERICHIA COLI  MRSA PCR Screening     Status: None   Collection Time: 10/09/19  2:46 AM   Specimen: Nasopharyngeal  Result Value Ref Range Status   MRSA by PCR NEGATIVE NEGATIVE Final    Comment:  The GeneXpert MRSA Assay (FDA approved for NASAL specimens only), is one component of a comprehensive MRSA colonization surveillance program. It is not intended to diagnose MRSA infection nor to guide or monitor treatment for MRSA infections. Performed at [facility], [address], [place], [place] [address]      Studies: MR BRAIN WO CONTRAST  Result Date: 10/12/2019 CLINICAL DATA:  End stage renal disease. Mental status changes. Vomiting. EXAM: MRI HEAD WITHOUT CONTRAST TECHNIQUE: Multiplanar, multiecho pulse sequences of the brain and surrounding structures were obtained without intravenous contrast. COMPARISON:  Head CT yesterday.  MRI 02/12/2019. FINDINGS: Brain: Diffusion imaging does not show any acute or subacute infarction. There chronic small-vessel ischemic changes of the pons. No focal cerebellar finding. Chronic small-vessel ischemic changes seen throughout the cerebral hemispheric white matter no mass lesion, hemorrhage, hydrocephalus or extra-axial collection. Vascular: Major vessels at the base of the brain show flow. Skull and upper cervical spine: Negative Sinuses/Orbits: Sinuses are clear. Left more than right mastoid effusions. Other: None IMPRESSION: No acute brain finding. Chronic small-vessel ischemic changes of the pons and cerebral hemispheric white matter. Electronically Signed   By: Nelson Chimes M.D.   On: 10/12/2019 18:59   PERIPHERAL VASCULAR CATHETERIZATION  Result Date: 10/13/2019 See op note   Scheduled Meds: . amiodarone  200 mg Oral Daily  . apixaban   2.5 mg Oral BID  . Chlorhexidine Gluconate Cloth  6 each Topical Q0600  . epoetin (EPOGEN/PROCRIT) injection  10,000 Units Intravenous Q M,W,F-HD  . feeding supplement (NEPRO CARB STEADY)  237 mL Oral BID BM  . FLUoxetine  20 mg Oral Daily  . folic acid  1 mg Oral Daily  . hydrALAZINE  10 mg Oral BID  . levothyroxine  88 mcg Oral Q0600  . multivitamin  1 tablet Oral QHS  . naLOXone (NARCAN)  injection  0.4 mg Intravenous Once  . pantoprazole (PROTONIX) IV  40 mg Intravenous Q12H  . sodium chloride flush  3 mL Intravenous Once  . traZODone  50 mg Oral QHS   Continuous Infusions: . sodium chloride    . sodium chloride    . fluconazole (DIFLUCAN) IV 100 mg (10/12/19 1627)    Assessment/Plan:  1. Epigastric abdominal pain with nausea and not being able to eat much.  Patient's epigastric pain has improved since she has come in.  I am unable to give any pain medications because of altered mental status.  Endoscopy showed some erythema and whitish plaques in the esophagus.  Patient on IV Diflucan and IV Protonix.  CT angio did not show any blockages.  Lipase was slightly high yesterday but normal on admission.  Not sure what to make of that.  Would like to see the patient eat better prior to disposition. 2. Acute unresponsive episodes on 10/11/2019 and 10/12/2019.  The first time the patient responded to Narcan.  The second time she did not.  MRI of the brain negative.  Today mental status better. 3. Acute cystitis with E. coli.  Given a few days of Rocephin.  In speaking with nephrology sometimes this can cause altered mental status I will get rid of this medication. 4. Essential hypertension on hydralazine 5. End-stage renal disease on dialysis 6. Paroxysmal atrial fibrillation on amiodarone and Eliquis 7. Anemia of chronic disease 8. Hyperlipidemia unspecified on atorvastatin 9. Chronic diastolic congestive heart failure.  Dialysis to remove fluid 10. Appreciate palliative consultation.     Code Status:     Code Status Orders  (  From admission, onward)         Start     Ordered   10/08/19 1508  Full code  Continuous        10/08/19 1508        Code Status History    Date Active Date Inactive Code Status Order ID Comments User Context   09/09/2019 0359 09/10/2019 2011 Full Code [id number]  Athena Masse, MD ED   06/29/2019 2028 07/01/2019 0108 Full Code [id number]  Ivor Costa, MD Inpatient   02/21/2019 0932 04/21/2019 0155 DNR [id number]  Sidney Ace, MD Inpatient   02/09/2019 1755 02/21/2019 0932 Full Code [id number]  Thornell Mule, MD ED   08/24/2018 0022 09/15/2018 1720 Full Code [id number]  Shela Leff, MD Inpatient   09/04/2017 1510 09/06/2017 1920 Full Code [id number]  Epifanio Lesches, MD ED   02/15/2017 1243 02/16/2017 2023 Full Code [id number]  Saundra Shelling, MD Inpatient   10/23/2015 0828 10/23/2015 0908 Full Code [id number]  Saundra Shelling, MD Inpatient   09/03/2015 0130 09/03/2015 1619 Full Code [id number]  Lance Coon, MD Inpatient   07/02/2013 1327 07/14/2013 1540 Full Code [id number]  Nani Skillern, PA-C Inpatient   06/29/2013 2153 07/02/2013 1327 Full Code [id number]  Larey Dresser, MD Inpatient   Advance Care Planning Activity     Family Communication: Spoke with husband on the phone Disposition Plan: Status is: Inpatient  Dispo: The patient is from: Home              Anticipated d/c is to: Home              Anticipated d/c date is: If she eats can potentially go home 10/14/2019 versus 10/15/2019              Patient currently has not eaten much since coming into the hospital.  Mental status better today than the previous 2 days.  Consultants:  Nephrology  Vascular surgery  Gastroenterology  Time spent: 28 minutes  [place]

## 2019-10-13 NOTE — Progress Notes (Signed)
Per vascular use pressure dressing and surgicel. Will continue to monitor.   Fuller Mandril, RN

## 2019-10-13 NOTE — Op Note (Signed)
Operative Note   Preoperative diagnosis:    1. ESRD with functional permanent access  Postoperative diagnosis:   1. ESRD with functional permanent access  Procedure:  Removal of Left Permcath  Physician Assistant: Hezzie Bump, PA-C  Supervising Surgeon:  Leotis Pain, MD  Anesthesia:  Local  EBL:  Minimal  Indication for the Procedure:  The patient has a functional permanent dialysis access and no longer needs their permcath. This can be removed.  Risks and benefits are discussed and informed consent is obtained.  Description of the Procedure:  The patient's left neck, chest and existing catheter were sterilely prepped and draped. The area around the catheter was anesthetized copiously with 1% lidocaine. The catheter was dissected out with curved hemostats until the cuff was freed from the surrounding fibrous sheath. The fiber sheath was transected, and the catheter was then removed in its entirety using gentle traction. Pressure was held and sterile dressings were placed. The patient tolerated the procedure well and was taken to the recovery room in stable condition.  Jamie Arias  10/13/2019, 2:32 PM  This note was created with Dragon Medical transcription system. Any errors in dictation are purely unintentional.

## 2019-10-13 NOTE — Progress Notes (Signed)
Pt. C/o severe nause upon arrival to [facility] recovery: pt. Med. With Zofran 4 mg IV slow push. Pt. Answers questions appropriately at present.

## 2019-10-13 NOTE — Progress Notes (Signed)
[facility]  ROUNDING NOTE   Subjective:   Hemodialysis treatment yesterday. Tolerated well. After treatment, patient developed altered mental status.   Today, patient feels well and states her mental status is better.   Ceftriaxone was discontinued.   Objective:  Vital signs in last 24 hours:  Temp:  [97.8 F (36.6 C)-98.4 F (36.9 C)] 98.2 F (36.8 C) (08/10 1153) Pulse Rate:  [61-68] 63 (08/10 1153) Resp:  [16] 16 (08/10 1153) BP: (130-169)/(50-71) 157/51 (08/10 1153) SpO2:  [94 %-100 %] 94 % (08/10 1153)  Weight change:  Filed Weights   10/08/19 1059 10/10/19 0843 10/10/19 1300  Weight: 77.6 kg 29.4 kg 74.2 kg    Intake/Output: I/O last 3 completed shifts: In: -  Out: 500 [Other:500]   Intake/Output this shift:  Total I/O In: 180 [P.O.:180] Out: -   Physical Exam: General: NAD , laying in bed  Head: Normocephalic, atraumatic. Moist oral mucosal membranes  Eyes: Anicteric, PERRL  Neck: Supple, trachea midline  Lungs:  Diminished at bases.    Heart: Regular rate and rhythm  Abdomen:  nontender  Extremities:  No peripheral edema.  Neurologic: Nonfocal, moving all four extremities  Skin: No lesions  Access: Left AVG , RIJ permcath    Basic Metabolic Panel: Recent Labs  Lab 10/08/19 1111 10/08/19 1111 10/09/19 0431 10/09/19 0431 10/09/19 0847 10/10/19 0703 10/12/19 0410 10/13/19 0613  NA 141  --  138  --   --  139 137 136  K 4.4  --  3.4*  --   --  3.3* 3.1* 3.4*  CL 94*  --  97*  --   --  98 98 95*  CO2 29  --  30  --   --  26 29 26   GLUCOSE 192*  --  121*  --   --  128* 85 71  BUN 28*  --  14  --   --  11 22 13   CREATININE 5.46*  --  3.09*  --   --  2.53* 4.55* 3.29*  CALCIUM 9.5   < > 8.6*   < >  --  8.7* 8.3* 8.2*  PHOS  --   --   --   --  2.6  --   --  1.8*   < > = values in this interval not displayed.    Liver Function Tests: Recent Labs  Lab 10/08/19 1111 10/12/19 0410 10/13/19 0613  AST 93* 55*  --   ALT 17 14  --    ALKPHOS 50 41  --   BILITOT 2.2* 1.1  --   PROT 7.4 5.5*  --   ALBUMIN 3.1* 2.5* 2.6*   Recent Labs  Lab 10/08/19 1111 10/12/19 0410  LIPASE 17 170*   No results for input(s): AMMONIA in the last 168 hours.  CBC: Recent Labs  Lab 10/08/19 1111 10/09/19 0431 10/10/19 0703 10/11/19 0542 10/12/19 0410  WBC 4.8 5.7 5.7 5.9 6.7  HGB 8.0* 7.9* 7.7* 8.2* 7.9*  HCT 25.2* 24.2* 24.7* 25.5* 24.7*  MCV 93.3 91.7 93.6 89.5 91.1  PLT 158 145* 148* 154 146*    Cardiac Enzymes: No results for input(s): CKTOTAL, CKMB, CKMBINDEX, TROPONINI in the last 168 hours.  BNP: Invalid input(s): POCBNP  CBG: Recent Labs  Lab 10/10/19 0853 10/11/19 1251 10/12/19 1357  GLUCAP 118* 114* 13    Microbiology: Results for orders placed or performed during the hospital encounter of 10/08/19  SARS Coronavirus 2 by RT PCR (hospital  order, performed in [facility] hospital lab) Nasopharyngeal Nasopharyngeal Swab     Status: None   Collection Time: 10/08/19  2:29 PM   Specimen: Nasopharyngeal Swab  Result Value Ref Range Status   SARS Coronavirus 2 NEGATIVE NEGATIVE Final    Comment: (NOTE) SARS-CoV-2 target nucleic acids are NOT DETECTED.  The SARS-CoV-2 RNA is generally detectable in upper and lower respiratory specimens during the acute phase of infection. The lowest concentration of SARS-CoV-2 viral copies this assay can detect is 250 copies / mL. A negative result does not preclude SARS-CoV-2 infection and should not be used as the sole basis for treatment or other patient management decisions.  A negative result may occur with improper specimen collection / handling, submission of specimen other than nasopharyngeal swab, presence of viral mutation(s) within the areas targeted by this assay, and inadequate number of viral copies (<250 copies / mL). A negative result must be combined with clinical observations, patient history, and epidemiological information.  Fact Sheet for  Patients:   StrictlyIdeas.no  Fact Sheet for Healthcare Providers: BankingDealers.co.za  This test is not yet approved or  cleared by the [place] FDA and has been authorized for detection and/or diagnosis of SARS-CoV-2 by FDA under an Emergency Use Authorization (EUA).  This EUA will remain in effect (meaning this test can be used) for the duration of the COVID-19 declaration under Section 564(b)(1) of the Act, 21 U.S.C. section 360bbb-3(b)(1), unless the authorization is terminated or revoked sooner.  Performed at [facility], [address] [place], [place] [address]   Urine Culture     Status: Abnormal   Collection Time: 10/08/19  2:31 PM   Specimen: Urine, Random  Result Value Ref Range Status   Specimen Description   Final    URINE, RANDOM Performed at [facility], [address] [place], [place] [address]    Special Requests   Final    NONE Performed at [facility], [address] [place], [place] [address]    Culture >=100,000 COLONIES/mL ESCHERICHIA COLI (A)  Final   Report Status 10/10/2019 FINAL  Final   Organism ID, Bacteria ESCHERICHIA COLI (A)  Final      Susceptibility   Escherichia coli - MIC*    AMPICILLIN >=32 RESISTANT Resistant     CEFAZOLIN <=4 SENSITIVE Sensitive     CEFTRIAXONE <=0.25 SENSITIVE Sensitive     CIPROFLOXACIN INTERMEDIATE Intermediate     GENTAMICIN <=1 SENSITIVE Sensitive     IMIPENEM <=0.25 SENSITIVE Sensitive     NITROFURANTOIN <=16 SENSITIVE Sensitive     TRIMETH/SULFA >=320 RESISTANT Resistant     AMPICILLIN/SULBACTAM 16 INTERMEDIATE Intermediate     PIP/TAZO <=4 SENSITIVE Sensitive     * >=100,000 COLONIES/mL ESCHERICHIA COLI  MRSA PCR Screening     Status: None   Collection Time: 10/09/19  2:46 AM   Specimen: Nasopharyngeal  Result Value Ref Range Status   MRSA by PCR NEGATIVE NEGATIVE Final    Comment:        The GeneXpert MRSA Assay  (FDA approved for NASAL specimens only), is one component of a comprehensive MRSA colonization surveillance program. It is not intended to diagnose MRSA infection nor to guide or monitor treatment for MRSA infections. Performed at [facility], [address] [place], [place] [address]     Coagulation Studies: No results for input(s): LABPROT, INR in the last 72 hours.  Urinalysis: No results for input(s): COLORURINE, LABSPEC, [place], [place], [place], BILIRUBINUR, KETONESUR,  PROTEINUR, UROBILINOGEN, NITRITE, LEUKOCYTESUR in the last 72 hours.  Invalid input(s): APPERANCEUR    Imaging: MR BRAIN WO CONTRAST  Result Date: 10/12/2019 CLINICAL DATA:  End stage renal disease. Mental status changes. Vomiting. EXAM: MRI HEAD WITHOUT CONTRAST TECHNIQUE: Multiplanar, multiecho pulse sequences of the brain and surrounding structures were obtained without intravenous contrast. COMPARISON:  Head CT yesterday.  MRI 02/12/2019. FINDINGS: Brain: Diffusion imaging does not show any acute or subacute infarction. There chronic small-vessel ischemic changes of the pons. No focal cerebellar finding. Chronic small-vessel ischemic changes seen throughout the cerebral hemispheric white matter no mass lesion, hemorrhage, hydrocephalus or extra-axial collection. Vascular: Major vessels at the base of the brain show flow. Skull and upper cervical spine: Negative Sinuses/Orbits: Sinuses are clear. Left more than right mastoid effusions. Other: None IMPRESSION: No acute brain finding. Chronic small-vessel ischemic changes of the pons and cerebral hemispheric white matter. Electronically Signed   By: Nelson Chimes M.D.   On: 10/12/2019 18:59     Medications:   . sodium chloride    . sodium chloride    . fluconazole (DIFLUCAN) IV 100 mg (10/12/19 1627)   . amiodarone  200 mg Oral Daily  . apixaban  2.5 mg Oral BID  . Chlorhexidine Gluconate Cloth  6 each Topical Q0600  . epoetin (EPOGEN/PROCRIT)  injection  10,000 Units Intravenous Q M,W,F-HD  . feeding supplement (NEPRO CARB STEADY)  237 mL Oral BID BM  . FLUoxetine  20 mg Oral Daily  . folic acid  1 mg Oral Daily  . hydrALAZINE  10 mg Oral BID  . levothyroxine  88 mcg Oral Q0600  . multivitamin  1 tablet Oral QHS  . naLOXone (NARCAN)  injection  0.4 mg Intravenous Once  . pantoprazole (PROTONIX) IV  40 mg Intravenous Q12H  . sodium chloride flush  3 mL Intravenous Once  . traZODone  50 mg Oral QHS   sodium chloride, sodium chloride, acetaminophen, alteplase, fluticasone, heparin, lidocaine (PF), lidocaine-prilocaine, melatonin, naLOXone (NARCAN)  injection, nitroGLYCERIN, ondansetron **OR** ondansetron (ZOFRAN) IV, pentafluoroprop-tetrafluoroeth, promethazine  Assessment/ Plan:  [name] is a 64 y.o.  female with end stage renal disease on hemodialysis, hypertension, pulmonary hypertension, hypothroidism, diabetes mellitus type II, diabetic neuropathy, Hyperlipidemia,  congestive heart failure  was admitted on  10/08/2019 for Generalized abdominal pain [R10.84] Hypoxia [R09.02] ESRD (end stage renal disease) ([place]) [N18.6] Nausea vomiting and diarrhea [R11.2, R19.7]  [facility] MWF [address] Left AVG 78.5 kg  1. ESRD on HD: - Scheduled for vascular to remove permcath later today. Appreciate vascular input - Continue MWF. Next treatment for tomorrow.   2. Anemia in CKD:   - ESA with HD treatments.   3. Hypertension: Not currently taking blood pressure medications at home. Started on hydralazine 10mg  bid PO this admission.   4. Secondary Hyperparathyroidism: PTH 83 on this admission. Phosphorus low at 1.8. Holding all phosphorus binders and vitamin D agents.    LOS: 5 Rahaf Carbonell 8/10/20211:42 PM

## 2019-10-14 LAB — CBC
HCT: 25 % — ABNORMAL LOW (ref 36.0–46.0)
HCT: 20.2 % — ABNORMAL LOW (ref 36.0–46.0)
Hemoglobin: 7.7 g/dL — ABNORMAL LOW (ref 12.0–15.0)
Hemoglobin: 6.7 g/dL — ABNORMAL LOW (ref 12.0–15.0)
MCH: 29.2 pg (ref 26.0–34.0)
MCH: 30 pg (ref 26.0–34.0)
MCHC: 30.8 g/dL (ref 30.0–36.0)
MCHC: 33.2 g/dL (ref 30.0–36.0)
MCV: 94.7 fL (ref 80.0–100.0)
MCV: 90.6 fL (ref 80.0–100.0)
Platelets: 146 10*3/uL — ABNORMAL LOW (ref 150–400)
Platelets: 134 10*3/uL — ABNORMAL LOW (ref 150–400)
RBC: 2.64 MIL/uL — ABNORMAL LOW (ref 3.87–5.11)
RBC: 2.23 MIL/uL — ABNORMAL LOW (ref 3.87–5.11)
RDW: 23.2 % — ABNORMAL HIGH (ref 11.5–15.5)
RDW: 22.9 % — ABNORMAL HIGH (ref 11.5–15.5)
WBC: 6.9 10*3/uL (ref 4.0–10.5)
WBC: 5.5 10*3/uL (ref 4.0–10.5)
nRBC: 0 % (ref 0.0–0.2)
nRBC: 0 % (ref 0.0–0.2)

## 2019-10-14 LAB — BASIC METABOLIC PANEL
Anion gap: 8 (ref 5–15)
BUN: 20 mg/dL (ref 8–23)
CO2: 31 mmol/L (ref 22–32)
Calcium: 7.9 mg/dL — ABNORMAL LOW (ref 8.9–10.3)
Chloride: 97 mmol/L — ABNORMAL LOW (ref 98–111)
Creatinine, Ser: 4.32 mg/dL — ABNORMAL HIGH (ref 0.44–1.00)
GFR calc Af Amer: 12 mL/min — ABNORMAL LOW (ref >60)
GFR calc non Af Amer: 10 mL/min — ABNORMAL LOW (ref >60)
Glucose, Bld: 78 mg/dL (ref 70–99)
Potassium: 3.6 mmol/L (ref 3.5–5.1)
Sodium: 136 mmol/L (ref 135–145)

## 2019-10-14 LAB — PREPARE RBC (CROSSMATCH)

## 2019-10-14 MED ORDER — SODIUM CHLORIDE 0.9% IV SOLUTION: Freq: Once | INTRAVENOUS | Status: DC

## 2019-10-14 NOTE — TOC Progression Note (Signed)
Transition of Care [facility]) - Progression Note    Patient Details  Name: Jamie Arias MRN: [id number] Date of Birth: 08/29/1955  [facility] CM/SW [place], LCSW Phone Number: 10/14/2019, 11:19 AM  Clinical Narrative: Per MD and palliative, patient and husband do not want SNF placement. Made [facility] aware of plan for discharge tomorrow. Patient was active with them for PT and RN prior to admission. Made outpatient palliative referral to [facility] with [facility]. Left voicemail for husband. Will confirm plans and address for EMS transport home tomorrow when he calls back.    Expected Discharge Plan and Services                                                 Social Determinants of Health (SDOH) Interventions    Readmission Risk Interventions Readmission Risk Prevention Plan 10/11/2019 10/10/2019 02/13/2019  Transportation Screening - Complete -  PCP or Specialist Appt within 3-5 Days - Complete -  [place] or [place] - Complete -  Social Work Consult for [place] Planning/Counseling - Complete -  Palliative Care Screening - Not Applicable -  Medication Review Press photographer) - Complete Complete  PCP or Specialist appointment within 3-5 days of discharge Complete - -  [place] or [place] Patient refused - Complete  SW Recovery Care/Counseling Consult Complete - Complete  Palliative Care Screening Not Applicable - -  [place] Not Applicable - Not Applicable  Some recent data might be hidden

## 2019-10-14 NOTE — Progress Notes (Signed)
Palliative: Mrs. Waldrep is lying quietly in bed.  She appears acutely/chronically ill and very frail.  She is sleeping as I enter, but wakes easily when I call her name.  She does briefly make but not keep eye contact, and will often times her eyes and seem to drift off to sleep.  She is oriented to person and place, but unable to tell me the month.  I believe she is able to make her basic needs known.  Her husband of 16 years, [name], is at bedside.  We talked about Mrs. Gauger acute health problems.  We talk in detail about her weakness and decline since her several months hospitalization December 2020 until February 2021.  [name] tells me that he provides most care for Leoti while at home.  We talked about Terrian's continued weakness, it seems that she is unable to even move herself in the bed adequately.  We talked about short-term rehab versus home with home health.  Both [place] and [name] states their preference is for her to go home.  I share my concern about Mark's ability to care for [facility].  At this point we need time to see how Aaryanna is able to recover.  We again today talk about Tamekia must be able to sit in a chair for hemodialysis.  She will be taken to HD in a chair today.  We talked about CODE STATUS, "treat the treatable, but allowing natural death".  Rayne is able to clearly state that she would not want life support.  I asked [name] if he can accept her wishes and although a little reluctant, he does say that he can.  I asked him to tell me more about his reluctance, and he states I do not want her to have to "live on machines".  I share that I will change CODE STATUS in the chart, complete the goldenrod form, and she will have a purple DNR bracelet.  They state understanding.  I reassure both Brei and [name] that we will continue to treat the treatable.  I share my concern again about Janessa's decline over the last 6 to 8 months.  [name] shares that she is spending a lot of time in bed except on the  days where she has to get up and go to dialysis.  I share that just like I will get sick again, Loray will also.  I shared that I do not think this will be 3 months from now, but sooner.  I asked Tauriel what she wants this time to look like and feel like.  We talked about preferred place of death, where she would like to be when her soul leaves her body.  She tells me "with family".  We talked about passing away at home, some people feel safer in the hospital, options of hospice home.  I encouraged Lamona and [name] to consider preferred place of death.  Conference with attending, bedside nursing staff, transition of care team related to patient condition, needs, CODE STATUS discussions goals of care, disposition.    Plan:   Treat the treatable but no CPR or intubation (DNR).  Must sit in a chair for HD.  Considering home with home health versus short-term rehab.  45 minutes  Quinn Axe, NP Palliative medicine team Team phone [phone] Greater than 50% of this time was spent counseling and coordinating care related to the above assessment and plan.

## 2019-10-14 NOTE — Progress Notes (Signed)
Jamie Arias at [place] NAME: Jamie Arias    MR#:  [id number]  DATE OF BIRTH:  26-Nov-1955  SUBJECTIVE:  patient more awake. Husband at the bedside. She said she ate some eggs and biscuit. Per staff year 20% of her breakfast today.  REVIEW OF SYSTEMS:   Review of Systems  Constitutional: Negative for chills, fever and weight loss.  HENT: Negative for ear discharge, ear pain and nosebleeds.   Eyes: Negative for blurred vision, pain and discharge.  Respiratory: Negative for sputum production, shortness of breath, wheezing and stridor.   Cardiovascular: Negative for chest pain, palpitations, orthopnea and PND.  Gastrointestinal: Negative for abdominal pain, diarrhea, nausea and vomiting.  Genitourinary: Negative for frequency and urgency.  Musculoskeletal: Negative for back pain and joint pain.  Neurological: Positive for weakness. Negative for sensory change, speech change and focal weakness.  Psychiatric/Behavioral: Negative for depression and hallucinations. The patient is not nervous/anxious.    Tolerating Diet:some Tolerating PT: bed bound per husband  DRUG ALLERGIES:   Allergies  Allergen Reactions  . Nsaids Other (See Comments)    Contraindicated due to kidney disease.  Judeth Cornfield Reductase Inhibitors   . Doxycycline Other (See Comments)    tremor    VITALS:  Blood pressure (!) 124/102, pulse 69, temperature 98.9 F (37.2 C), temperature source Oral, resp. rate 20, height 5\' 4"  (1.626 m), weight 74.2 kg, SpO2 97 %.  PHYSICAL EXAMINATION:   Physical Exam  GENERAL:  64 y.o.-year-old patient lying in the bed with no acute distress.  Chronically ill, deconditioned Pallor+   HEENT: Head atraumatic, normocephalic. Oropharynx and nasopharynx clear.  LUNGS: Normal breath sounds bilaterally, no wheezing, rales, rhonchi. No use of accessory muscles of respiration. PD site not bleeding CARDIOVASCULAR: S1, S2 normal. No murmurs, rubs,  or gallops.  ABDOMEN: Soft, nontender, nondistended. Bowel sounds present. No organomegaly or mass.  EXTREMITIES: No cyanosis, clubbing or edema b/l.    NEUROLOGIC: weak-- no focal weakness PSYCHIATRIC:  patient is alert and oriented x 2.  SKIN: per RN LABORATORY PANEL:  CBC Recent Labs  Lab 10/14/19 1120  WBC 5.5  HGB 6.7*  HCT 20.2*  PLT 134*    Chemistries  Recent Labs  Lab 10/12/19 0410 10/13/19 0613 10/14/19 0544  NA 137   < > 136  K 3.1*   < > 3.6  CL 98   < > 97*  CO2 29   < > 31  GLUCOSE 85   < > 78  BUN 22   < > 20  CREATININE 4.55*   < > 4.32*  CALCIUM 8.3*   < > 7.9*  AST 55*  --   --   ALT 14  --   --   ALKPHOS 41  --   --   BILITOT 1.1  --   --    < > = values in this interval not displayed.   Cardiac Enzymes No results for input(s): TROPONINI in the last 168 hours. RADIOLOGY:  MR BRAIN WO CONTRAST  Result Date: 10/12/2019 CLINICAL DATA:  End stage renal disease. Mental status changes. Vomiting. EXAM: MRI HEAD WITHOUT CONTRAST TECHNIQUE: Multiplanar, multiecho pulse sequences of the brain and surrounding structures were obtained without intravenous contrast. COMPARISON:  Head CT yesterday.  MRI 02/12/2019. FINDINGS: Brain: Diffusion imaging does not show any acute or subacute infarction. There chronic small-vessel ischemic changes of the pons. No focal cerebellar finding. Chronic small-vessel ischemic changes seen throughout the  cerebral hemispheric white matter no mass lesion, hemorrhage, hydrocephalus or extra-axial collection. Vascular: Major vessels at the base of the brain show flow. Skull and upper cervical spine: Negative Sinuses/Orbits: Sinuses are clear. Left more than right mastoid effusions. Other: None IMPRESSION: No acute brain finding. Chronic small-vessel ischemic changes of the pons and cerebral hemispheric white matter. Electronically Signed   By: Jamie Arias M.D.   On: 10/12/2019 18:59   PERIPHERAL VASCULAR CATHETERIZATION  Result Date:  10/13/2019 See op note  ASSESSMENT AND PLAN:  Jamie Arias  is a 64 y.o. female with a known history of end-stage renal disease, hypertension and CAD presents with not feeling well since Tuesday night.  She had severe abdominal pain nausea dry heaves and diarrhea  1. Epigastric abdominal pain with nausea and not being able to eat much.  Patient's epigastric pain has improved since she has come in.  -  Endoscopy showed some erythema and whitish plaques in the esophagus.  Patient on IV Diflucan and IV Protonix.  2. - CT angio did not show any blockages.  Lipase was slightly high yesterday but normal on admission.  -Patient has eaten up to 20% of food today. According to the husband she is a picky eater at home also. Calorie counter be initiated.  3. Acute unresponsive episodes on 10/11/2019 and 10/12/2019.    MRI of the brain negative.  Today mental status better.  4. Acute cystitis with E. coli.  Given a few days of Rocephin.  In speaking with nephrology sometimes this can cause altered mental status--rocephin d/ced. No s/s infection 5. Essential hypertension on hydralazine 6. End-stage renal disease on dialysis--PD cath removed--had bleeding at the site required dressing changes and pt being on eliquis. 7. Paroxysmal atrial fibrillation on amiodarone and Eliquis (holding it) since hgb dropped to 6.7. cardiology to see if pt needs to be on or can we hold it 8. Anemia of chronic disease--hgb down to 6.7. transfuse 1 units PRBC 9. Hyperlipidemia unspecified on atorvastatin 10. Chronic diastolic congestive heart failure.  Dialysis to remove fluid 11. Appreciate palliative consultation--pt is DNR. Pt and husband [name] agreeable    Procedures: PD catheter removal Family communication :Mark--husband Consults :Nephrology, cardiology CODE STATUS: DNR DVT Prophylaxis :SCD  Status is: Inpatient  Remains inpatient appropriate because:Inpatient level of care appropriate due to severity of  illness   Dispo: The patient is from: Home              Anticipated d/c is to: Home              Anticipated d/c date is: 1 day              Patient currently is not medically stable to d/c. Pt to get BT today likley d/c in 1-2 days home. Pt and husband refuse SNF       TOTAL TIME TAKING CARE OF THIS PATIENT: *25* minutes.  >50% time spent on counselling and coordination of care  Note: This dictation was prepared with Dragon dictation along with smaller phrase technology. Any transcriptional errors that result from this process are unintentional.  [name] M.D    [facility]   CC: Primary care physician; [name], MDPatient ID: [name], female   DOB: [birth year], 64 y.o.   MRN: [id number]

## 2019-10-14 NOTE — Progress Notes (Signed)
PT Cancellation Note  Patient Details Name: Jamie Arias MRN: [id number] DOB: 04-24-1955   Cancelled Treatment:     Pt was asleep in supine with HOB elevated. She easily awakes but refused session. "I'm just so tired, I had HD earlier." PT will continue to follow per POC and progress as able per pt tolerance.    Willette Pa 10/14/2019, 4:01 PM

## 2019-10-14 NOTE — Care Management Important Message (Signed)
Important Message  Patient Details  Name: Jamie Arias MRN: [id number] Date of Birth: March 05, 1956   Medicare Important Message Given:  Yes     Loann Quill 10/14/2019, 11:09 AM

## 2019-10-14 NOTE — Progress Notes (Signed)
[facility] Liaison RN note:  Received new referral from Dayton Scrape, [facility] CM/SW for [facility] to follow post discharge. Patient information given to referral.  Thank you for this referral.  Please call with any questions or concerns.  Zandra Abts, RN [facility] Liaison [phone]

## 2019-10-14 NOTE — Progress Notes (Signed)
[facility]  ROUNDING NOTE   Subjective:   Seen and examined on hemodialysis treatment. Tolerating treatment well. Alert and able to hold a conversation.   Left IJ permcath removed yesterday.    HEMODIALYSIS FLOWSHEET:  Blood Flow Rate (mL/min): 200 mL/min Arterial Pressure (mmHg): -30 mmHg Venous Pressure (mmHg): 60 mmHg Transmembrane Pressure (mmHg): 30 mmHg Ultrafiltration Rate (mL/min): 340 mL/min Dialysate Flow Rate (mL/min): 600 ml/min Conductivity: Machine : 13.8 Conductivity: Machine : 13.8 Dialysis Fluid Bolus: Normal Saline Bolus Amount (mL): 250 mL    Objective:  Vital signs in last 24 hours:  Temp:  [98.3 F (36.8 C)-99.4 F (37.4 C)] 98.6 F (37 C) (08/11 0455) Pulse Rate:  [63-68] 63 (08/11 0455) Resp:  [14-20] 14 (08/11 0455) BP: (145-192)/(36-54) 145/52 (08/11 0455) SpO2:  [93 %-99 %] 94 % (08/11 0455) Weight:  [74.2 kg] 74.2 kg (08/10 1356)  Weight change:  Filed Weights   10/10/19 0843 10/10/19 1300 10/13/19 1356  Weight: 29.4 kg 74.2 kg 74.2 kg    Intake/Output: I/O last 3 completed shifts: In: 340.4 [P.O.:180; I.V.:1.2; IV Piggyback:159.2] Out: -    Intake/Output this shift:  Total I/O In: 118 [P.O.:118] Out: -   Physical Exam: General: NAD , laying in bed  Head: Normocephalic, atraumatic. Moist oral mucosal membranes  Eyes: Anicteric, PERRL  Neck: Supple, trachea midline  Lungs:  Diminished at bases.    Heart: Regular rate and rhythm  Abdomen:  nontender  Extremities:  No peripheral edema.  Neurologic: Nonfocal, moving all four extremities  Skin: No lesions  Access: Left AVG , RIJ permcath    Basic Metabolic Panel: Recent Labs  Lab 10/09/19 0431 10/09/19 0431 10/09/19 0847 10/10/19 0703 10/10/19 0703 10/12/19 0410 10/13/19 0613 10/14/19 0544  NA 138  --   --  139  --  137 136 136  K 3.4*  --   --  3.3*  --  3.1* 3.4* 3.6  CL 97*  --   --  98  --  98 95* 97*  CO2 30  --   --  26  --  29 26 31   GLUCOSE  121*  --   --  128*  --  85 71 78  BUN 14  --   --  11  --  22 13 20   CREATININE 3.09*  --   --  2.53*  --  4.55* 3.29* 4.32*  CALCIUM 8.6*   < >  --  8.7*   < > 8.3* 8.2* 7.9*  PHOS  --   --  2.6  --   --   --  1.8*  --    < > = values in this interval not displayed.    Liver Function Tests: Recent Labs  Lab 10/08/19 1111 10/12/19 0410 10/13/19 0613  AST 93* 55*  --   ALT 17 14  --   ALKPHOS 50 41  --   BILITOT 2.2* 1.1  --   PROT 7.4 5.5*  --   ALBUMIN 3.1* 2.5* 2.6*   Recent Labs  Lab 10/08/19 1111 10/12/19 0410  LIPASE 17 170*   No results for input(s): AMMONIA in the last 168 hours.  CBC: Recent Labs  Lab 10/10/19 0703 10/11/19 0542 10/12/19 0410 10/14/19 0544 10/14/19 1120  WBC 5.7 5.9 6.7 6.9 5.5  HGB 7.7* 8.2* 7.9* 7.7* 6.7*  HCT 24.7* 25.5* 24.7* 25.0* 20.2*  MCV 93.6 89.5 91.1 94.7 90.6  PLT 148* 154 146* 146* 134*  Cardiac Enzymes: No results for input(s): CKTOTAL, CKMB, CKMBINDEX, TROPONINI in the last 168 hours.  BNP: Invalid input(s): POCBNP  CBG: Recent Labs  Lab 10/10/19 0853 10/11/19 1251 10/12/19 1357  GLUCAP 118* 114* 23    Microbiology: Results for orders placed or performed during the hospital encounter of 10/08/19  SARS Coronavirus 2 by RT PCR (hospital order, performed in [facility] hospital lab) Nasopharyngeal Nasopharyngeal Swab     Status: None   Collection Time: 10/08/19  2:29 PM   Specimen: Nasopharyngeal Swab  Result Value Ref Range Status   SARS Coronavirus 2 NEGATIVE NEGATIVE Final    Comment: (NOTE) SARS-CoV-2 target nucleic acids are NOT DETECTED.  The SARS-CoV-2 RNA is generally detectable in upper and lower respiratory specimens during the acute phase of infection. The lowest concentration of SARS-CoV-2 viral copies this assay can detect is 250 copies / mL. A negative result does not preclude SARS-CoV-2 infection and should not be used as the sole basis for treatment or other patient management decisions.   A negative result may occur with improper specimen collection / handling, submission of specimen other than nasopharyngeal swab, presence of viral mutation(s) within the areas targeted by this assay, and inadequate number of viral copies (<250 copies / mL). A negative result must be combined with clinical observations, patient history, and epidemiological information.  Fact Sheet for Patients:   StrictlyIdeas.no  Fact Sheet for Healthcare Providers: BankingDealers.co.za  This test is not yet approved or  cleared by the [place] FDA and has been authorized for detection and/or diagnosis of SARS-CoV-2 by FDA under an Emergency Use Authorization (EUA).  This EUA will remain in effect (meaning this test can be used) for the duration of the COVID-19 declaration under Section 564(b)(1) of the Act, 21 U.S.C. section 360bbb-3(b)(1), unless the authorization is terminated or revoked sooner.  Performed at [facility], [address] [place], [place] [address]   Urine Culture     Status: Abnormal   Collection Time: 10/08/19  2:31 PM   Specimen: Urine, Random  Result Value Ref Range Status   Specimen Description   Final    URINE, RANDOM Performed at [facility], [address] [place], [place] [address]    Special Requests   Final    NONE Performed at [facility], [address] [place], [place] [address]    Culture >=100,000 COLONIES/mL ESCHERICHIA COLI (A)  Final   Report Status 10/10/2019 FINAL  Final   Organism ID, Bacteria ESCHERICHIA COLI (A)  Final      Susceptibility   Escherichia coli - MIC*    AMPICILLIN >=32 RESISTANT Resistant     CEFAZOLIN <=4 SENSITIVE Sensitive     CEFTRIAXONE <=0.25 SENSITIVE Sensitive     CIPROFLOXACIN INTERMEDIATE Intermediate     GENTAMICIN <=1 SENSITIVE Sensitive     IMIPENEM <=0.25 SENSITIVE Sensitive     NITROFURANTOIN <=16 SENSITIVE Sensitive      TRIMETH/SULFA >=320 RESISTANT Resistant     AMPICILLIN/SULBACTAM 16 INTERMEDIATE Intermediate     PIP/TAZO <=4 SENSITIVE Sensitive     * >=100,000 COLONIES/mL ESCHERICHIA COLI  MRSA PCR Screening     Status: None   Collection Time: 10/09/19  2:46 AM   Specimen: Nasopharyngeal  Result Value Ref Range Status   MRSA by PCR NEGATIVE NEGATIVE Final    Comment:        The GeneXpert MRSA Assay (FDA approved for NASAL specimens only), is one component of a comprehensive MRSA colonization  surveillance program. It is not intended to diagnose MRSA infection nor to guide or monitor treatment for MRSA infections. Performed at [facility], [address] [place], [place] [address]     Coagulation Studies: No results for input(s): LABPROT, INR in the last 72 hours.  Urinalysis: No results for input(s): COLORURINE, LABSPEC, PHURINE, GLUCOSEU, HGBUR, BILIRUBINUR, KETONESUR, PROTEINUR, UROBILINOGEN, NITRITE, LEUKOCYTESUR in the last 72 hours.  Invalid input(s): APPERANCEUR    Imaging: MR BRAIN WO CONTRAST  Result Date: 10/12/2019 CLINICAL DATA:  End stage renal disease. Mental status changes. Vomiting. EXAM: MRI HEAD WITHOUT CONTRAST TECHNIQUE: Multiplanar, multiecho pulse sequences of the brain and surrounding structures were obtained without intravenous contrast. COMPARISON:  Head CT yesterday.  MRI 02/12/2019. FINDINGS: Brain: Diffusion imaging does not show any acute or subacute infarction. There chronic small-vessel ischemic changes of the pons. No focal cerebellar finding. Chronic small-vessel ischemic changes seen throughout the cerebral hemispheric white matter no mass lesion, hemorrhage, hydrocephalus or extra-axial collection. Vascular: Major vessels at the base of the brain show flow. Skull and upper cervical spine: Negative Sinuses/Orbits: Sinuses are clear. Left more than right mastoid effusions. Other: None IMPRESSION: No acute brain finding. Chronic small-vessel ischemic  changes of the pons and cerebral hemispheric white matter. Electronically Signed   By: Nelson Chimes M.D.   On: 10/12/2019 18:59   PERIPHERAL VASCULAR CATHETERIZATION  Result Date: 10/13/2019 See op note    Medications:   . sodium chloride    . sodium chloride    . sodium chloride Stopped (10/13/19 1909)  . fluconazole (DIFLUCAN) IV 50 mL/hr at 10/13/19 1800   . amiodarone  200 mg Oral Daily  . apixaban  2.5 mg Oral BID  . epoetin (EPOGEN/PROCRIT) injection  10,000 Units Intravenous Q M,W,F-HD  . feeding supplement (NEPRO CARB STEADY)  237 mL Oral BID BM  . FLUoxetine  20 mg Oral Daily  . folic acid  1 mg Oral Daily  . hydrALAZINE  10 mg Oral BID  . levothyroxine  88 mcg Oral Q0600  . multivitamin  1 tablet Oral QHS  . naLOXone (NARCAN)  injection  0.4 mg Intravenous Once  . pantoprazole (PROTONIX) IV  40 mg Intravenous Q12H  . sodium chloride flush  3 mL Intravenous Once  . traZODone  50 mg Oral QHS   sodium chloride, sodium chloride, sodium chloride, acetaminophen, alteplase, fluticasone, heparin, lidocaine (PF), lidocaine-prilocaine, melatonin, naLOXone (NARCAN)  injection, nitroGLYCERIN, ondansetron **OR** ondansetron (ZOFRAN) IV, pentafluoroprop-tetrafluoroeth, promethazine  Assessment/ Plan:  Jamie Arias is a 64 y.o.  female with end stage renal disease on hemodialysis, hypertension, pulmonary hypertension, hypothroidism, diabetes mellitus type II, diabetic neuropathy, Hyperlipidemia,  congestive heart failure  was admitted on  10/08/2019 for Generalized abdominal pain [R10.84] Hypoxia [R09.02] ESRD (end stage renal disease) ([place]) [N18.6] Nausea vomiting and diarrhea [R11.2, R19.7]  [facility] M[facility] [address] Left AVG 78.5 kg  1. ESRD on HD: seen and examined on hemodialysis treatment. Tolerating treatment well.  Patient's estimated dry weight may need to be lowered.   2. Anemia in CKD:   - ESA with HD treatments.   3. Hypertension: Not currently taking blood  pressure medications at home. Started on hydralazine 10mg  bid PO this admission. Tolerating this agent well.   4. Secondary Hyperparathyroidism: PTH 83 on this admission. Phosphorus low at 1.8. Holding all phosphorus binders and vitamin D agents.    LOS: 6 Therese Rocco 8/11/202112:05 PM

## 2019-10-15 ENCOUNTER — Encounter: Payer: Self-pay | Admitting: Internal Medicine

## 2019-10-15 LAB — TYPE AND SCREEN
ABO/RH(D): A POS
Antibody Screen: NEGATIVE
Unit division: 0

## 2019-10-15 LAB — BPAM RBC
Blood Product Expiration Date: 202109062359
ISSUE DATE / TIME: 202108111946
Unit Type and Rh: 6200

## 2019-10-15 LAB — CBC
HCT: 25.1 % — ABNORMAL LOW (ref 36.0–46.0)
Hemoglobin: 8.8 g/dL — ABNORMAL LOW (ref 12.0–15.0)
MCH: 31.1 pg (ref 26.0–34.0)
MCHC: 35.1 g/dL (ref 30.0–36.0)
MCV: 88.7 fL (ref 80.0–100.0)
Platelets: 154 10*3/uL (ref 150–400)
RBC: 2.83 MIL/uL — ABNORMAL LOW (ref 3.87–5.11)
RDW: 20.8 % — ABNORMAL HIGH (ref 11.5–15.5)
WBC: 5.6 10*3/uL (ref 4.0–10.5)
nRBC: 0 % (ref 0.0–0.2)

## 2019-10-15 MED ORDER — NEPRO/CARBSTEADY PO LIQD: 237.0000 mL | Freq: Two times a day (BID) | ORAL | 0 refills | Status: AC

## 2019-10-15 MED ORDER — FLUCONAZOLE 100 MG PO TABS
100.0000 mg | ORAL_TABLET | Freq: Every day | ORAL | 0 refills | Status: AC
Start: 2019-10-15 — End: 2019-10-17

## 2019-10-15 NOTE — Plan of Care (Signed)
Patient transported home by EMS. Husband took clothes, dentures and personal items home with him.

## 2019-10-15 NOTE — Plan of Care (Signed)
Patient discharged to home with husband via EMS.  Removed telemetry and PIV with tip intact, Reviewed medication and discharge instructions with husband who verbalized understanding.  Patient belongings sent with husband home.   Awaiting EMS transport.

## 2019-10-15 NOTE — Discharge Instructions (Signed)
Resume your hemodialysis Monday Wednesday Friday as before

## 2019-10-15 NOTE — Consult Note (Signed)
Cardiology Consultation:   [name]; [id number]; 06-16-1955   Admit date: 10/08/2019 Date of Consult: 10/15/2019  Primary Care Provider: [name], MD Primary Cardiologist: Fletcher Anon Primary Electrophysiologist:  None   Patient Profile:   Jamie Arias is a 64 y.o. female with a hx of CAD s/p CABG in 06/2013, HFpEF, pulmonary hypertension, ESRD on HD since 12/2015, Afib diagnosed in 08/2018 on Eliquis, COVID-19 in 09/2018, DVT of the left upper extremity in 02/2019, anemia of chronic disease, DM2 with diabetic neuropathy, HTN, HLD, orthostatic hypotension, and gait instability who is being seen today for the evaluation of [place] recommendations in the setting of worsening anemia at the request of Dr. Posey Pronto.  History of Present Illness:   Jamie Arias underwent CABG in 06/2013.  No ischemic evaluation since.  EF was previously reduced though subsequently improved by echo in 10/2015.  She was admitted with [id number] in 07/5730 complicated by MRSA bacteremia with encephalopathy.  She had a prolonged hospital admission from 04/24/2018 through 04/25/2019 for MRSA bacteremia complicated by sepsis, metabolic encephalopathy, acute hypoxic respiratory failure requiring intubation, and DVT of the left upper extremity.  TEE was recommended though unable to be performed secondary to encephalopathy.  Her most recent echo form 02/2019 showed an EF of 60-65% wih Gr2DD.   She was admitted to [facility] on 8/5 with severe abdominal pain, nausea, and vomiting requiring her to miss dialysis.  Initial CT of the abdomen showed a small right pleural effusion with some atelectasis on the right and left sides with no acute findings to explain her abdominal pain.  She underwent EGD which showed some erythema and plaques along the esophagus.  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  Rapid response was called on 8/8 and 8/9.  It was noted the patient had not eaten since her hospital admission.  CT head nonacute.  MRI brain nonacute.  She has consulted on by palliative care and made a DNR.  She underwent PD cath removal with noted bleeding.  In this setting, hemoglobin dropped to 6.7 on 8/11 with a baseline around 8.  Given this, Eliquis was held and cardiology was consulted for further recommendations regarding [place].  She has been transfused 1 unit of PRBC occult blood negative.  This morning, she reports eating "some" of her dinner. She denies any hematochezia, melena, falls, chest pain, dyspnea, or palpitations.     Past Medical History:  Diagnosis Date  . Anemia   . Arthritis    upper back and neck   . Chronic diastolic CHF (congestive heart failure) ([place])    a. Due to ischemic cardiomyopathy. EF as low as 35%, improved to normal s/p CABG; b. echo 07/06/13: EF 55-60%, no RWMAs, mod TR, trivial pericardial effusion not c/w tamponade physiology;  c. 10/2015 Echo: EF 65%, Gr1 DD, triv AI, mild MR, mildly dil LA, mod TR, PASP 84mmHg.  Marland Kitchen Coronary artery disease    a. NSTEMI 06/2013; b.cath: severe three-vessel CAD w/ EF 30% & mild-mod MR; c. s/p 3 vessel CABG 07/02/13 (LIMA-LAD, SVG-OM, and SVG-RPDA);  d. 10/2015 MV: no ischemia/infarct.  . Diabetes mellitus without complication ([place])   . Diabetic neuropathy ([place])   . ESRD (end stage renal disease) ([place])    a. 12/2015 initiated - mwf dialysis.  Marland Kitchen  GERD (gastroesophageal reflux disease)   . Hyperlipidemia   . Hypertension   . Hypothyroidism   . Myocardial infarction ([place]) 2014  . Pleural effusion 2015  . Pulmonary hypertension ([place])   . Wears dentures    full lower    Past Surgical History:  Procedure Laterality Date  . A/V FISTULAGRAM Right 12/17/2016   Procedure: A/V Fistulagram;  Surgeon: Algernon Huxley, MD;  Location: [place] [facility];  Service:  Cardiovascular;  Laterality: Right;  . A/V FISTULAGRAM Right 01/07/2017   Procedure: A/V Fistulagram;  Surgeon: Algernon Huxley, MD;  Location: [place] [facility];  Service: Cardiovascular;  Laterality: Right;  . A/V FISTULAGRAM Right 12/03/2017   Procedure: A/V FISTULAGRAM;  Surgeon: Katha Cabal, MD;  Location: [place] [facility];  Service: Cardiovascular;  Laterality: Right;  . A/V FISTULAGRAM Right 02/23/2019   Procedure: A/V Fistulagram;  Surgeon: Algernon Huxley, MD;  Location: [place] [facility];  Service: Cardiovascular;  Laterality: Right;  . A/V SHUNT INTERVENTION N/A 02/22/2017   Procedure: A/V SHUNT INTERVENTION;  Surgeon: Algernon Huxley, MD;  Location: [place] [facility];  Service: Cardiovascular;  Laterality: N/A;  . AV FISTULA PLACEMENT Right 02/03/2016   Procedure: INSERTION OF ARTERIOVENOUS (AV) GORE-TEX GRAFT ARM ( BRACH / AXILLARY );  Surgeon: Katha Cabal, MD;  Location: [facility];  Service: Vascular;  Laterality: Right;  . AV FISTULA PLACEMENT Left 07/09/2019   Procedure: INSERTION OF ARTERIOVENOUS (AV) GORE-TEX GRAFT ARM ( BRACHIAL AXILLARY );  Surgeon: Algernon Huxley, MD;  Location: [facility];  Service: Vascular;  Laterality: Left;  . [place] REMOVAL Right 03/04/2019   Procedure: REMOVAL OF ARTERIOVENOUS GORETEX GRAFT ([place]);  Surgeon: Algernon Huxley, MD;  Location: [facility];  Service: Vascular;  Laterality: Right;  . CARDIAC CATHETERIZATION    . CATARACT EXTRACTION Bilateral   . CHOLECYSTECTOMY N/A 12/09/2014   Procedure: LAPAROSCOPIC CHOLECYSTECTOMY;  Surgeon: Marlyce [name], MD;  Location: [facility];  Service: General;  Laterality: N/A;  . CORONARY ARTERY BYPASS GRAFT N/A 07/02/2013   Procedure: CORONARY ARTERY BYPASS GRAFTING (CABG);  Surgeon: Ivin [name], MD;  Location: [place];  Service: Open Heart Surgery;  Laterality: N/A;  CABG x three, using left internal mammary artery and right leg greater saphenous vein harvested endoscopically  . DIALYSIS/PERMA CATHETER  REMOVAL N/A 10/13/2019   Procedure: DIALYSIS/PERMA CATHETER REMOVAL;  Surgeon: Algernon Huxley, MD;  Location: [place] [facility];  Service: Cardiovascular;  Laterality: N/A;  . ESOPHAGOGASTRODUODENOSCOPY N/A 04/11/2019   Procedure: ESOPHAGOGASTRODUODENOSCOPY (EGD);  Surgeon: Jonathon Bellows, MD;  Location: [facility];  Service: Gastroenterology;  Laterality: N/A;  . ESOPHAGOGASTRODUODENOSCOPY (EGD) WITH PROPOFOL N/A 11/24/2015   Procedure: ESOPHAGOGASTRODUODENOSCOPY (EGD) WITH PROPOFOL;  Surgeon: [name], MD;  Location: [place];  Service: Endoscopy;  Laterality: N/A;  Diabetic - insulin  . ESOPHAGOGASTRODUODENOSCOPY (EGD) WITH PROPOFOL N/A 10/10/2019   Procedure: ESOPHAGOGASTRODUODENOSCOPY (EGD) WITH PROPOFOL;  Surgeon: Virgel Manifold, MD;  Location: [facility];  Service: Endoscopy;  Laterality: N/A;  . EYE SURGERY Bilateral    Cataract Extraction with IOL  . INTRAOPERATIVE TRANSESOPHAGEAL ECHOCARDIOGRAM N/A 07/02/2013   Procedure: INTRAOPERATIVE TRANSESOPHAGEAL ECHOCARDIOGRAM;  Surgeon: Ivin [name], MD;  Location: [place];  Service: Open Heart Surgery;  Laterality: N/A;  . PEG PLACEMENT N/A 03/03/2019   Procedure: PERCUTANEOUS ENDOSCOPIC GASTROSTOMY (PEG) PLACEMENT;  Surgeon: Virgel Manifold, MD;  Location: [facility];  Service: Endoscopy;  Laterality: N/A;  . PERIPHERAL VASCULAR CATHETERIZATION Right 12/06/2015  Procedure: Dialysis/Perma Catheter Insertion;  Surgeon: Katha Cabal, MD;  Location: [place] [facility];  Service: Cardiovascular;  Laterality: Right;  . PERIPHERAL VASCULAR THROMBECTOMY Right 09/28/2016   Procedure: Peripheral Vascular Thrombectomy;  Surgeon: Katha Cabal, MD;  Location: [place] [facility];  Service: Cardiovascular;  Laterality: Right;  . PERIPHERAL VASCULAR THROMBECTOMY Right 05/20/2018   Procedure: PERIPHERAL VASCULAR THROMBECTOMY;  Surgeon: Katha Cabal, MD;  Location: [place] [facility];  Service: Cardiovascular;   Laterality: Right;  . PORTA CATH REMOVAL N/A 06/01/2016   Procedure: [place]i [name] Cath Removal;  Surgeon: Katha Cabal, MD;  Location: [place] [facility];  Service: Cardiovascular;  Laterality: N/A;  . THORACENTESIS Left 2015     Home Meds: Prior to Admission medications   Medication Sig Start Date End Date Taking? Authorizing Provider  atorvastatin (LIPITOR) 40 MG tablet Take 1 tablet (40 mg total) by mouth daily. 08/28/19  Yes [name], MD  ELIQUIS 5 MG TABS tablet Take 1 tablet (5 mg total) by mouth 2 (two) times daily. 09/14/19  Yes [name], MD  FLUoxetine (PROZAC) 20 MG capsule Take 1 capsule (20 mg total) by mouth daily. 06/30/19  Yes [name], MD  levothyroxine (SYNTHROID) 88 MCG tablet Take 1 tablet (88 mcg total) by mouth daily before breakfast. 08/28/19  Yes [name], MD  lidocaine-prilocaine (EMLA) cream Apply 1 application topically as needed.   Yes [provider]  Melatonin 3 MG TABS Take 3 mg by mouth at bedtime as needed (sleep).    Yes [provider]  multivitamin (RENA-VIT) TABS tablet Take 1 tablet by mouth at bedtime. 09/09/18  Yes Hosie Poisson, MD  nitroGLYCERIN (NITROSTAT) 0.4 MG SL tablet DISSOLVE ONE TABLET UNDER THE TONGUE EVERY 5 MINUTES AS NEEDED FOR CHEST PAIN.  DO NOT EXCEED A TOTAL OF 3 DOSES IN 15 MINUTES Patient taking differently: Place 0.4 mg under the tongue every 5 (five) minutes as needed for chest pain. DISSOLVE ONE TABLET UNDER THE TONGUE EVERY 5 MINUTES AS NEEDED FOR CHEST PAIN.  DO NOT EXCEED A TOTAL OF 3 DOSES IN 15 MINUTES 08/18/18  Yes Wellington Hampshire, MD  omeprazole (PRILOSEC) 20 MG capsule Take 20 mg by mouth daily.   Yes [provider]  ondansetron (ZOFRAN-ODT) 4 MG disintegrating tablet Take 1 tablet (4 mg total) by mouth every 8 (eight) hours as needed for nausea or vomiting. Patient needs appointment with PCP to receive further refills. 08/07/19  Yes [name], MD  PACERONE  200 MG tablet TAKE 1 TABLET BY MOUTH ONCE DAILY. PATIENT NEEDS APPOINTMENT WITH PCP FOR FURTHER REFILLS Patient taking differently: Take 200 mg by mouth daily.  09/21/19  Yes [name], MD  traZODone (DESYREL) 50 MG tablet TAKE 1 TABLET BY MOUTH AT BEDTIME. PATIENT NEEDS APPOINTMENT WITH PCP FOR FURTHER REFILLS Patient taking differently: Take 50 mg by mouth at bedtime.  09/14/19  Yes [name], MD  vitamin C (VITAMIN C) 500 MG tablet Take 1 tablet (500 mg total) by mouth daily. 09/10/18  Yes Hosie Poisson, MD  acetaminophen (TYLENOL) 325 MG tablet Take 650 mg by mouth daily as needed for moderate pain or headache.     [provider]  fluticasone (FLONASE) 50 MCG/ACT nasal spray Place 2 sprays into both nostrils daily. Patient taking differently: Place 2 sprays into both nostrils daily as needed for allergies or rhinitis.  09/17/17   [name], MD  folic  acid (FOLVITE) 1 MG tablet Take 1 tablet (1 mg total) by mouth daily. 04/21/19   Sidney Ace, MD  gabapentin (NEURONTIN) 100 MG capsule Take 100 mg by mouth 3 (three) times daily.    [provider]  hydrALAZINE (APRESOLINE) 10 MG tablet Take 1 tablet (10 mg total) by mouth in the morning and at bedtime. 06/23/19   Marrianne Mood D, PA-C  hydrocortisone cream 1 % Apply topically as needed for itching. 04/20/19   Sidney Ace, MD    Inpatient Medications: Scheduled Meds: . sodium chloride   Intravenous Once  . amiodarone  200 mg Oral Daily  . epoetin (EPOGEN/PROCRIT) injection  10,000 Units Intravenous Q M,W,F-HD  . feeding supplement (NEPRO CARB STEADY)  237 mL Oral BID BM  . FLUoxetine  20 mg Oral Daily  . folic acid  1 mg Oral Daily  . hydrALAZINE  10 mg Oral BID  . levothyroxine  88 mcg Oral Q0600  . multivitamin  1 tablet Oral QHS  . naLOXone (NARCAN)  injection  0.4 mg Intravenous Once  . pantoprazole (PROTONIX) IV  40 mg Intravenous Q12H  . sodium chloride flush  3 mL  Intravenous Once  . traZODone  50 mg Oral QHS   Continuous Infusions: . sodium chloride    . sodium chloride    . sodium chloride Stopped (10/13/19 1909)  . fluconazole (DIFLUCAN) IV 100 mg (10/14/19 1606)   PRN Meds: sodium chloride, sodium chloride, sodium chloride, acetaminophen, alteplase, fluticasone, heparin, lidocaine (PF), lidocaine-prilocaine, melatonin, naLOXone (NARCAN)  injection, nitroGLYCERIN, ondansetron **OR** ondansetron (ZOFRAN) IV, pentafluoroprop-tetrafluoroeth, promethazine  Allergies:   Allergies  Allergen Reactions  . Nsaids Other (See Comments)    Contraindicated due to kidney disease.  Judeth Cornfield Reductase Inhibitors   . Doxycycline Other (See Comments)    tremor    Social History:   Social History   Socioeconomic History  . Marital status: Married    Spouse name: Not on file  . Number of children: Not on file  . Years of education: Not on file  . Highest education level: Not on file  Occupational History  . Occupation: works for Building [employer]  Tobacco Use  . Smoking status: Never Smoker  . Smokeless tobacco: Never Used  Vaping Use  . Vaping Use: Never used  Substance and Sexual Activity  . Alcohol use: No    Alcohol/week: 0.0 standard drinks  . Drug use: No  . Sexual activity: Not on file  Other Topics Concern  . Not on file  Social History Narrative   Patient lives at home with her husband. Patient has 4 adult children.   Social Determinants of Health   Financial Resource Strain:   . Difficulty of Paying Living Expenses:   Food Insecurity:   . Worried About Charity fundraiser in the Last Year:   . Arboriculturist in the Last Year:   Transportation Needs:   . Film/video editor (Medical):   Marland Kitchen Lack of Transportation (Non-Medical):   Physical Activity:   . Days of Exercise per Week:   . Minutes of Exercise per Session:   Stress:   . Feeling of Stress :   Social Connections:   . Frequency of Communication with Friends and  Family:   . Frequency of Social Gatherings with Friends and Family:   . Attends Religious Services:   . Active Member of Clubs or Organizations:   . Attends Archivist Meetings:   .  Marital Status:   Intimate Partner Violence:   . Fear of Current or Ex-Partner:   . Emotionally Abused:   Marland Kitchen Physically Abused:   . Sexually Abused:      Family History:   Family History  Problem Relation Age of Onset  . COPD Mother   . Cancer Mother        Lung  . Pulmonary embolism Father   . Diabetes Father   . Diabetes Paternal Grandfather   . Heart disease Maternal Grandmother   . Colon cancer Neg Hx   . Colon polyps Neg Hx   . Esophageal cancer Neg Hx   . Pancreatic cancer Neg Hx   . Liver disease Neg Hx     ROS:  Review of Systems  Constitutional: Positive for malaise/fatigue. Negative for chills, diaphoresis, fever and weight loss.  HENT: Negative for congestion.   Eyes: Negative for discharge and redness.  Respiratory: Negative for cough, hemoptysis, sputum production, shortness of breath and wheezing.   Cardiovascular: Negative for chest pain, palpitations, orthopnea, claudication, leg swelling and PND.  Gastrointestinal: Positive for abdominal pain, diarrhea, nausea and vomiting. Negative for blood in stool, constipation, heartburn and melena.  Genitourinary: Negative for hematuria.  Musculoskeletal: Negative for falls and myalgias.  Skin: Negative for rash.  Neurological: Positive for weakness. Negative for dizziness, tingling, tremors, sensory change, speech change, focal weakness and loss of consciousness.  Endo/Heme/Allergies: Does not bruise/bleed easily.  Psychiatric/Behavioral: Negative for substance abuse. The patient is not nervous/anxious.   All other systems reviewed and are negative.     Physical Exam/Data:   Vitals:   10/14/19 2015 10/14/19 2030 10/14/19 2337 10/15/19 0457  BP: (!) 128/39 (!) 123/35 (!) 149/44 (!) 160/39  Pulse: 65 65 (!) 58 61  Resp:  15 16 14 16   Temp: 98.7 F (37.1 C) 99 F (37.2 C) 99.3 F (37.4 C) 98.9 F (37.2 C)  TempSrc: Oral Oral Oral Oral  SpO2:  93% 95% 99%  Weight:      Height:        Intake/Output Summary (Last 24 hours) at 10/15/2019 0728 Last data filed at 10/14/2019 2335 Gross per 24 hour  Intake 508 ml  Output 1500 ml  Net -992 ml   Filed Weights   10/10/19 0843 10/10/19 1300 10/13/19 1356  Weight: 29.4 kg 74.2 kg 74.2 kg   Body mass index is 28.08 kg/m.   Physical Exam: General: Well developed, well nourished, in no acute distress. Head: Normocephalic, atraumatic, sclera non-icteric, no xanthomas, nares without discharge.  Neck: Negative for carotid bruits. JVD not elevated. Lungs: Clear bilaterally to auscultation without wheezes, rales, or rhonchi. Breathing is unlabored. Heart: RRR with S1 S2. II/VI systolic murmur USB, no rubs, or gallops appreciated. Abdomen: Soft, non-tender, non-distended with normoactive bowel sounds. No hepatomegaly. No rebound/guarding. No obvious abdominal masses. Msk:  Strength and tone appear normal for age. Extremities: No clubbing or cyanosis. No edema. Distal pedal pulses are 2+ and equal bilaterally. Neuro: Alert and oriented X 3. No facial asymmetry. No focal deficit. Moves all extremities spontaneously. Psych:  Responds to questions appropriately with a normal affect.   EKG:  The EKG was personally reviewed and demonstrates: NSR, 80 bpm, PACs, low voltage QRS, no acute st/t changes Telemetry:  Telemetry was personally reviewed and demonstrates: SR  Weights: Filed Weights   10/10/19 0843 10/10/19 1300 10/13/19 1356  Weight: 29.4 kg 74.2 kg 74.2 kg    Relevant CV Studies:  2D echo 02/2019: 1. Left  ventricular ejection fraction, by visual estimation, is 60 to  65%. The left ventricle has normal function. There is mildly increased  left ventricular hypertrophy.  2. Elevated left atrial pressure.  3. Left ventricular diastolic parameters are  consistent with Grade II  diastolic dysfunction (pseudonormalization).  4. The left ventricle has no regional wall motion abnormalities.  5. Global right ventricle has mildly reduced systolic function.The right  ventricular size is mildly enlarged. Mildly increased right ventricular  wall thickness.  6. Left atrial size was normal.  7. Right atrial size was normal.  8. Mild mitral annular calcification.  9. The mitral valve is normal in structure. Trace mitral valve  regurgitation. No evidence of mitral stenosis.  10. The tricuspid valve is normal in structure. Tricuspid valve  regurgitation moderate-severe.  11. The aortic valve is tricuspid. Aortic valve regurgitation is trivial.  Mild to moderate aortic valve sclerosis/calcification without any evidence  of aortic stenosis.  12. The pulmonic valve was grossly normal. Pulmonic valve regurgitation is  trivial.  13. Mildly elevated pulmonary artery systolic pressure.  14. The inferior vena cava is normal in size with <50% respiratory  variability, suggesting right atrial pressure of 8 mmHg.  Laboratory Data:  Chemistry Recent Labs  Lab 10/12/19 0410 10/13/19 0613 10/14/19 0544  NA 137 136 136  K 3.1* 3.4* 3.6  CL 98 95* 97*  CO2 29 26 31   GLUCOSE 85 71 78  BUN 22 13 20   CREATININE 4.55* 3.29* 4.32*  CALCIUM 8.3* 8.2* 7.9*  GFRNONAA 9* 14* 10*  GFRAA 11* 16* 12*  ANIONGAP 10 15 8     Recent Labs  Lab 10/08/19 1111 10/12/19 0410 10/13/19 0613  PROT 7.4 5.5*  --   ALBUMIN 3.1* 2.5* 2.6*  AST 93* 55*  --   ALT 17 14  --   ALKPHOS 50 41  --   BILITOT 2.2* 1.1  --    Hematology Recent Labs  Lab 10/12/19 0410 10/14/19 0544 10/14/19 1120  WBC 6.7 6.9 5.5  RBC 2.71* 2.64* 2.23*  HGB 7.9* 7.7* 6.7*  HCT 24.7* 25.0* 20.2*  MCV 91.1 94.7 90.6  MCH 29.2 29.2 30.0  MCHC 32.0 30.8 33.2  RDW 22.5* 23.2* 22.9*  PLT 146* 146* 134*   Cardiac EnzymesNo results for input(s): TROPONINI in the last 168 hours. No  results for input(s): TROPIPOC in the last 168 hours.  BNPNo results for input(s): BNP, PROBNP in the last 168 hours.  DDimer No results for input(s): DDIMER in the last 168 hours.  Radiology/Studies:  CT HEAD WO CONTRAST  Result Date: 10/11/2019 IMPRESSION: Atrophy and chronic ischemic change in the pons and white matter. No acute abnormality. Electronically Signed   By: Franchot Gallo M.D.   On: 10/11/2019 13:50   MR BRAIN WO CONTRAST  Result Date: 10/12/2019 IMPRESSION: No acute brain finding. Chronic small-vessel ischemic changes of the pons and cerebral hemispheric white matter. Electronically Signed   By: Nelson Chimes M.D.   On: 10/12/2019 18:59   PERIPHERAL VASCULAR CATHETERIZATION  Result Date: 10/13/2019 See op note  CT Angio Abd/Pel w/ and/or w/o  Result Date: 10/11/2019 IMPRESSION: VASCULAR 1. Atherosclerotic disease in the abdomen and pelvis without significant stenosis. Aortic Atherosclerosis (ICD10-I70.0). 2. 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 3. Visceral arteries are patent. No significant visceral artery stenosis. NON-VASCULAR 1. Evidence for a fluid overload state. Slightly increased abdominal and pelvic ascites. Mild edema and stranding around the pancreas.  Diffuse subcutaneous edema. Evidence for increased right heart pressures. Findings may be related to underlying cardiac disease. Edema and fluid around the pancreas is nonspecific and recommend correlation with amylase and lipase. 2. Diffuse wall thickening involving the urinary bladder. Findings are similar to the recent examination. Findings could be related to cystitis. 3. Slightly enlarged right pleural effusion. Persistent volume loss and consolidation at the lung bases, particularly at the left lung base. 4. Probable tiny nonobstructive stones in left renal pelvis. Electronically Signed   By: Markus Daft M.D.   On: 10/11/2019 17:10    Assessment and Plan:   1.   PAF: -Currently, in sinus rhythm -CHA2DS2-VASc at least 6 (CHF, HTN, age x1, diabetes, vascular disease, gender) -Continue to hold Eliquis at this time in the setting of her acute illness with significant anemia -Continue to hold Eliquis at this time given acute blood loss anemia in the context of anemia of chronic disease with minimal reserve, resume when/if able to reduce CVA risk -PTA amiodarone  2.  CAD status post CABG: -Never with chest pain -HS-Tn 37 x 2 upon admission, not consistent with ACS -Previously on Eliquis in place of ASA, with the holding of Eliquis consider low-dose aspirin when able pending hemoglobin -Resume PTA statin when able -No plans for ischemic evaluation at this time  3.  HFpEF/pulmonary hypertension: -Volume management per HD -No indication for escalation of therapy at this time  4.  Acute on chronic anemia of chronic disease with acute blood loss anemia: -CBC this morning pending -Eliquis on hold -Status post 1 unit PRBC  5.  History of DVT: -Occurred in the setting of prolonged hospitalization in 02/2019 -Previously on Eliquis  6.  HTN: -Blood pressure has been labile ranging from the low 517O to 160V systolic -Continue current therapy per IM  7.  HLD: -LDL 92 from 06/2019 -Resume PTA atorvastatin when able   For questions or updates, please contact [facility] Please consult [email or web address] for contact info under Cardiology/STEMI.   Signed, Christell Faith, PA-C [place] Pager: [phone] 10/15/2019, 7:28 AM

## 2019-10-15 NOTE — Progress Notes (Signed)
[facility]  ROUNDING NOTE   Subjective:   Husband at bedside. Hemodialysis treatment yesterday. Tolerated treatment yesterday. UF of 1.5 liters.  PRBC transfusion yesterday. Eliquis has been held.   Patient states she is eating well.   Objective:  Vital signs in last 24 hours:  Temp:  [98.7 F (37.1 C)-99.3 F (37.4 C)] 98.9 F (37.2 C) (08/12 0457) Pulse Rate:  [58-69] 63 (08/12 0936) Resp:  [14-20] 18 (08/12 0936) BP: (111-160)/(35-102) 158/51 (08/12 0936) SpO2:  [93 %-99 %] 97 % (08/12 0936)  Weight change:  Filed Weights   10/10/19 0843 10/10/19 1300 10/13/19 1356  Weight: 29.4 kg 74.2 kg 74.2 kg    Intake/Output: I/O last 3 completed shifts: In: 509.1 [P.O.:118; I.V.:1.1; Blood:390] Out: 1500 [Other:1500]   Intake/Output this shift:  No intake/output data recorded.  Physical Exam: General: NAD , laying in bed  Head: Normocephalic, atraumatic. Moist oral mucosal membranes  Eyes: Anicteric, PERRL  Neck: Supple, trachea midline  Lungs:  Diminished at bases.    Heart: Regular rate and rhythm  Abdomen:  nontender  Extremities:  No peripheral edema.  Neurologic: Nonfocal, moving all four extremities  Skin: No lesions  Access: Left AVG      Basic Metabolic Panel: Recent Labs  Lab 10/09/19 0431 10/09/19 0431 10/09/19 0847 10/10/19 0703 10/10/19 0703 10/12/19 0410 10/13/19 0613 10/14/19 0544  NA 138  --   --  139  --  137 136 136  K 3.4*  --   --  3.3*  --  3.1* 3.4* 3.6  CL 97*  --   --  98  --  98 95* 97*  CO2 30  --   --  26  --  29 26 31   GLUCOSE 121*  --   --  128*  --  85 71 78  BUN 14  --   --  11  --  22 13 20   CREATININE 3.09*  --   --  2.53*  --  4.55* 3.29* 4.32*  CALCIUM 8.6*   < >  --  8.7*   < > 8.3* 8.2* 7.9*  PHOS  --   --  2.6  --   --   --  1.8*  --    < > = values in this interval not displayed.    Liver Function Tests: Recent Labs  Lab 10/12/19 0410 10/13/19 0613  AST 55*  --   ALT 14  --   ALKPHOS 41  --    BILITOT 1.1  --   PROT 5.5*  --   ALBUMIN 2.5* 2.6*   Recent Labs  Lab 10/12/19 0410  LIPASE 170*   No results for input(s): AMMONIA in the last 168 hours.  CBC: Recent Labs  Lab 10/11/19 0542 10/12/19 0410 10/14/19 0544 10/14/19 1120 10/15/19 0801  WBC 5.9 6.7 6.9 5.5 5.6  HGB 8.2* 7.9* 7.7* 6.7* 8.8*  HCT 25.5* 24.7* 25.0* 20.2* 25.1*  MCV 89.5 91.1 94.7 90.6 88.7  PLT 154 146* 146* 134* 154    Cardiac Enzymes: No results for input(s): CKTOTAL, CKMB, CKMBINDEX, TROPONINI in the last 168 hours.  BNP: Invalid input(s): POCBNP  CBG: Recent Labs  Lab 10/10/19 0853 10/11/19 1251 10/12/19 1357  GLUCAP 118* 114* 1    Microbiology: Results for orders placed or performed during the hospital encounter of 10/08/19  SARS Coronavirus 2 by RT PCR (hospital order, performed in [facility] hospital lab) Nasopharyngeal Nasopharyngeal Swab     Status:  None   Collection Time: 10/08/19  2:29 PM   Specimen: Nasopharyngeal Swab  Result Value Ref Range Status   SARS Coronavirus 2 NEGATIVE NEGATIVE Final    Comment: (NOTE) SARS-CoV-2 target nucleic acids are NOT DETECTED.  The SARS-CoV-2 RNA is generally detectable in upper and lower respiratory specimens during the acute phase of infection. The lowest concentration of SARS-CoV-2 viral copies this assay can detect is 250 copies / mL. A negative result does not preclude SARS-CoV-2 infection and should not be used as the sole basis for treatment or other patient management decisions.  A negative result may occur with improper specimen collection / handling, submission of specimen other than nasopharyngeal swab, presence of viral mutation(s) within the areas targeted by this assay, and inadequate number of viral copies (<250 copies / mL). A negative result must be combined with clinical observations, patient history, and epidemiological information.  Fact Sheet for Patients:    StrictlyIdeas.no  Fact Sheet for Healthcare Providers: BankingDealers.co.za  This test is not yet approved or  cleared by the [place] FDA and has been authorized for detection and/or diagnosis of SARS-CoV-2 by FDA under an Emergency Use Authorization (EUA).  This EUA will remain in effect (meaning this test can be used) for the duration of the COVID-19 declaration under Section 564(b)(1) of the Act, 21 U.S.C. section 360bbb-3(b)(1), unless the authorization is terminated or revoked sooner.  Performed at [facility], [address] [place], [place] [address]   Urine Culture     Status: Abnormal   Collection Time: 10/08/19  2:31 PM   Specimen: Urine, Random  Result Value Ref Range Status   Specimen Description   Final    URINE, RANDOM Performed at [facility], [address] [place], [place] [address]    Special Requests   Final    NONE Performed at [facility], [address] [place], [place] [address]    Culture >=100,000 COLONIES/mL ESCHERICHIA COLI (A)  Final   Report Status 10/10/2019 FINAL  Final   Organism ID, Bacteria ESCHERICHIA COLI (A)  Final      Susceptibility   Escherichia coli - MIC*    AMPICILLIN >=32 RESISTANT Resistant     CEFAZOLIN <=4 SENSITIVE Sensitive     CEFTRIAXONE <=0.25 SENSITIVE Sensitive     CIPROFLOXACIN INTERMEDIATE Intermediate     GENTAMICIN <=1 SENSITIVE Sensitive     IMIPENEM <=0.25 SENSITIVE Sensitive     NITROFURANTOIN <=16 SENSITIVE Sensitive     TRIMETH/SULFA >=320 RESISTANT Resistant     AMPICILLIN/SULBACTAM 16 INTERMEDIATE Intermediate     PIP/TAZO <=4 SENSITIVE Sensitive     * >=100,000 COLONIES/mL ESCHERICHIA COLI  MRSA PCR Screening     Status: None   Collection Time: 10/09/19  2:46 AM   Specimen: Nasopharyngeal  Result Value Ref Range Status   MRSA by PCR NEGATIVE NEGATIVE Final    Comment:        The GeneXpert MRSA Assay (FDA approved  for NASAL specimens only), is one component of a comprehensive MRSA colonization surveillance program. It is not intended to diagnose MRSA infection nor to guide or monitor treatment for MRSA infections. Performed at [facility], [address] [place],  [address]     Coagulation Studies: No results for input(s): LABPROT, INR in the last 72 hours.  Urinalysis: No results for input(s): COLORURINE, LABSPEC, PHURINE, GLUCOSEU, HGBUR, BILIRUBINUR, KETONESUR, PROTEINUR, UROBILINOGEN, NITRITE, LEUKOCYTESUR in the last 72 hours.  Invalid input(s): APPERANCEUR  Imaging: PERIPHERAL VASCULAR CATHETERIZATION  Result Date: 10/13/2019 See op note    Medications:   . sodium chloride    . sodium chloride    . sodium chloride Stopped (10/13/19 1909)  . fluconazole (DIFLUCAN) IV 100 mg (10/14/19 1606)   . sodium chloride   Intravenous Once  . amiodarone  200 mg Oral Daily  . epoetin (EPOGEN/PROCRIT) injection  10,000 Units Intravenous Q M,W,F-HD  . feeding supplement (NEPRO CARB STEADY)  237 mL Oral BID BM  . FLUoxetine  20 mg Oral Daily  . folic acid  1 mg Oral Daily  . hydrALAZINE  10 mg Oral BID  . levothyroxine  88 mcg Oral Q0600  . multivitamin  1 tablet Oral QHS  . naLOXone (NARCAN)  injection  0.4 mg Intravenous Once  . pantoprazole (PROTONIX) IV  40 mg Intravenous Q12H  . sodium chloride flush  3 mL Intravenous Once  . traZODone  50 mg Oral QHS   sodium chloride, sodium chloride, sodium chloride, acetaminophen, alteplase, fluticasone, heparin, lidocaine (PF), lidocaine-prilocaine, melatonin, naLOXone (NARCAN)  injection, nitroGLYCERIN, ondansetron **OR** ondansetron (ZOFRAN) IV, pentafluoroprop-tetrafluoroeth, promethazine  Assessment/ Plan:  [name] is a 64 y.o.  female with end stage renal disease on hemodialysis, hypertension, pulmonary hypertension, hypothroidism, diabetes mellitus type II, diabetic neuropathy, Hyperlipidemia,  congestive  heart failure  was admitted on  10/08/2019 for Generalized abdominal pain [R10.84] Hypoxia [R09.02] ESRD (end stage renal disease) ([place]) [N18.6] Nausea vomiting and diarrhea [R11.2, R19.7]  [facility] MWF [address] Left AVG 78.5 kg  1. ESRD on HD:  - Continue MWF schedule, next scheduled treatment is for tomorrow.   2. Anemia in CKD:  Status post PRBC transfusion.  - ESA with HD treatments.   3. Hypertension: Not currently taking blood pressure medications at home. Started on hydralazine 10mg  bid PO this admission. Tolerating this agent well.   4. Secondary Hyperparathyroidism: PTH 83 on this admission. Phosphorus low at 1.8. Holding all phosphorus binders and vitamin D agents.    LOS: 7 Jamie Arias 8/12/202112:12 PM

## 2019-10-15 NOTE — Discharge Summary (Signed)
[facility] [place] NAME: Jamie Arias    MR#:  [id number]  DATE OF BIRTH:  1955/08/22  DATE OF ADMISSION:  10/08/2019 ADMITTING PHYSICIAN: Loletha Grayer, MD  DATE OF DISCHARGE: 10/15/2019  PRIMARY CARE PHYSICIAN: [name], MD    ADMISSION DIAGNOSIS:  Generalized abdominal pain [R10.84] Hypoxia [R09.02] ESRD (end stage renal disease) ([place]) [N18.6] Nausea vomiting and diarrhea [R11.2, R19.7]  DISCHARGE DIAGNOSIS:  Epigastric Abdominal pain suspected Gastritis/Esophagitis acute on chronic anemia status post blood transfusion-- off eliquis for now ESRD on HD SECONDARY DIAGNOSIS:   Past Medical History:  Diagnosis Date  . Anemia   . Arthritis    upper back and neck   . Chronic diastolic CHF (congestive heart failure) ([place])    a. Due to ischemic cardiomyopathy. EF as low as 35%, improved to normal s/p CABG; b. echo 07/06/13: EF 55-60%, no RWMAs, mod TR, trivial pericardial effusion not c/w tamponade physiology;  c. 10/2015 Echo: EF 65%, Gr1 DD, triv AI, mild MR, mildly dil LA, mod TR, PASP 72mmHg.  [facility] Kitchen Coronary artery disease    a. NSTEMI 06/2013; b.cath: severe three-vessel CAD w/ EF 30% & mild-mod MR; c. s/p 3 vessel CABG 07/02/13 (LIMA-LAD, SVG-OM, and SVG-RPDA);  d. 10/2015 MV: no ischemia/infarct.  . Diabetes mellitus without complication ([place])   . Diabetic neuropathy ([place])   . ESRD (end stage renal disease) ([place])    a. 12/2015 initiated - mwf dialysis.  Marland Kitchen GERD (gastroesophageal reflux disease)   . Hyperlipidemia   . Hypertension   . Hypothyroidism   . Myocardial infarction ([place]) 2014  . Pleural effusion 2015  . Pulmonary hypertension ([place])   . Wears dentures    full lower    HOSPITAL COURSE:   [name] a known history of end-stage renal disease, hypertension and CAD presents with not feeling well since Tuesday night. She had severe abdominal pain nausea dry heaves and  diarrhea  1. Epigastric abdominal pain with nausea and not being able to eat much. Patient's epigastric pain has improved since she has come in.  - Endoscopy showed some erythema and whitish plaques in the esophagus. Patient on IV Diflucan and IV Protonix. --change to oral diflucan (7 days ) and PPI -patient encouraged to eat small frequent meals at home to keep up her nutrition. She is started on nePro drink also. I have told husband to bring these drinks at home for her to keep her nutrition in. -CT angio did not show any blockages. Lipase was slightly high yesterday but normal on admission.  -Patient has eaten up to 20% of food today. According to the husband she is a picky eater at home also.   2. Acute unresponsive episodes on 10/11/2019 and 10/12/2019.   MRI of the brain negative. Mental status back to baseline  3.Acute cystitis with E. coli. Given a few days of Rocephin. In speaking with nephrology sometimes this can cause altered mental status--rocephin d/ced. No s/s infection. Does not appear to be true infection  4. Essential hypertension on hydralazine 5. End-stage renal disease on dialysis--PD cath removed--had bleeding at the site required dressing changes and pt being on eliquis.  6. Paroxysmal atrial fibrillation on amiodarone and Eliquis (holding it) since hgb dropped to 6.7. [facility] cardiology input appreciated. Patient's eliquis is held. Husband aware. 7. Anemia of chronic disease--hgb down to 6.7. transfuse 1 units PRBC-- hemoglobin 8.8. No signs of active bleeding. 8. Hyperlipidemia unspecified on atorvastatin 9.  Chronic diastolic congestive heart failure. Dialysis to remove fluid. Stable 10. Appreciate palliative consultation--pt is DNR. Pt and husband Jamie Arias agreeable    Procedures: PD catheter removal Family communication :Jamie Arias--husband Consults :Nephrology, cardiology CODE STATUS: DNR DVT Prophylaxis :SCD  Status is: Inpatient    Dispo: The patient is  from: Home  Anticipated d/c is to: Home with home health  Anticipated d/c date is: today  Patient currently is  medically stable to d/c and best at baseline.  Patient to discharge home. Outpatient palliative care to be followed. Husband Jamie Arias in the room agrees with plan. CONSULTS OBTAINED:  Treatment Team:  Algernon Huxley, MD Kate Sable, MD Minna Merritts, MD  DRUG ALLERGIES:   Allergies  Allergen Reactions  . Nsaids Other (See Comments)    Contraindicated due to kidney disease.  Jamie Arias Reductase Inhibitors   . Doxycycline Other (See Comments)    tremor    DISCHARGE MEDICATIONS:   Allergies as of 10/15/2019      Reactions   Nsaids Other (See Comments)   Contraindicated due to kidney disease.   5-alpha Reductase Inhibitors    Doxycycline Other (See Comments)   tremor      Medication List    STOP taking these medications   Eliquis 5 MG Tabs tablet Generic drug: apixaban     TAKE these medications   acetaminophen 325 MG tablet Commonly known as: TYLENOL Take 650 mg by mouth daily as needed for moderate pain or headache.   ascorbic acid 500 MG tablet Commonly known as: VITAMIN C Take 1 tablet (500 mg total) by mouth daily.   atorvastatin 40 MG tablet Commonly known as: LIPITOR Take 1 tablet (40 mg total) by mouth daily.   feeding supplement (NEPRO CARB STEADY) Liqd Take 237 mLs by mouth 2 (two) times daily between meals.   fluconazole 100 MG tablet Commonly known as: Diflucan Take 1 tablet (100 mg total) by mouth daily for 2 doses.   FLUoxetine 20 MG capsule Commonly known as: PROZAC Take 1 capsule (20 mg total) by mouth daily.   fluticasone 50 MCG/ACT nasal spray Commonly known as: FLONASE Place 2 sprays into both nostrils daily. What changed:   when to take this  reasons to take this   folic acid 1 MG tablet Commonly known as: FOLVITE Take 1 tablet (1 mg total) by mouth daily.    gabapentin 100 MG capsule Commonly known as: NEURONTIN Take 100 mg by mouth 3 (three) times daily.   hydrALAZINE 10 MG tablet Commonly known as: APRESOLINE Take 1 tablet (10 mg total) by mouth in the morning and at bedtime.   hydrocortisone cream 1 % Apply topically as needed for itching.   levothyroxine 88 MCG tablet Commonly known as: SYNTHROID Take 1 tablet (88 mcg total) by mouth daily before breakfast.   lidocaine-prilocaine cream Commonly known as: EMLA Apply 1 application topically as needed.   melatonin 3 MG Tabs tablet Take 3 mg by mouth at bedtime as needed (sleep).   multivitamin Tabs tablet Take 1 tablet by mouth at bedtime.   nitroGLYCERIN 0.4 MG SL tablet Commonly known as: NITROSTAT DISSOLVE ONE TABLET UNDER THE TONGUE EVERY 5 MINUTES AS NEEDED FOR CHEST PAIN.  DO NOT EXCEED A TOTAL OF 3 DOSES IN 15 MINUTES What changed: See the new instructions.   omeprazole 20 MG capsule Commonly known as: PRILOSEC Take 20 mg by mouth daily.   ondansetron 4 MG disintegrating tablet Commonly known as: ZOFRAN-ODT Take 1  tablet (4 mg total) by mouth every 8 (eight) hours as needed for nausea or vomiting. Patient needs appointment with PCP to receive further refills.   Pacerone 200 MG tablet Generic drug: amiodarone TAKE 1 TABLET BY MOUTH ONCE DAILY. PATIENT NEEDS APPOINTMENT WITH PCP FOR FURTHER REFILLS What changed: See the new instructions.   traZODone 50 MG tablet Commonly known as: DESYREL TAKE 1 TABLET BY MOUTH AT BEDTIME. PATIENT NEEDS APPOINTMENT WITH PCP FOR FURTHER REFILLS What changed: See the new instructions.            Discharge Care Instructions  (From admission, onward)         Start     Ordered   10/15/19 0000  Discharge wound care:       Comments: None per records   10/15/19 0951          If you experience worsening of your admission symptoms, develop shortness of breath, life threatening emergency, suicidal or homicidal thoughts you  must seek medical attention immediately by calling 911 or calling your MD immediately  if symptoms less severe.  You Must read complete instructions/literature along with all the possible adverse reactions/side effects for all the Medicines you take and that have been prescribed to you. Take any new Medicines after you have completely understood and accept all the possible adverse reactions/side effects.   Please note  You were cared for by a hospitalist during your hospital stay. If you have any questions about your discharge medications or the care you received while you were in the hospital after you are discharged, you can call the unit and asked to speak with the hospitalist on call if the hospitalist that took care of you is not available. Once you are discharged, your primary care physician will handle any further medical issues. Please note that NO REFILLS for any discharge medications will be authorized once you are discharged, as it is imperative that you return to your primary care physician (or establish a relationship with a primary care physician if you do not have one) for your aftercare needs so that they can reassess your need for medications and monitor your lab values. Today   SUBJECTIVE   No new complaints  VITAL SIGNS:  Blood pressure (!) 158/51, pulse 63, temperature 98.9 F (37.2 C), temperature source Oral, resp. rate 18, height 5\' 4"  (1.626 m), weight 74.2 kg, SpO2 97 %.  I/O:    Intake/Output Summary (Last 24 hours) at 10/15/2019 0958 Last data filed at 10/14/2019 2335 Gross per 24 hour  Intake 390 ml  Output 1500 ml  Net -1110 ml    PHYSICAL EXAMINATION:   GENERAL:  64 y.o.-year-old patient lying in the bed with no acute distress.  Chronically ill, deconditioned Pallor+   HEENT: Head atraumatic, normocephalic. Oropharynx and nasopharynx clear.  LUNGS: Normal breath sounds bilaterally, no wheezing, rales, rhonchi. No use of accessory muscles of respiration.  PD site not bleeding CARDIOVASCULAR: S1, S2 normal. No murmurs, rubs, or gallops.  ABDOMEN: Soft, nontender, nondistended. Bowel sounds present. No organomegaly or mass.  EXTREMITIES: No cyanosis, clubbing or edema b/l.    NEUROLOGIC: weak-- no focal weakness PSYCHIATRIC:  patient is alert and oriented x 2.  SKIN: per RN DATA REVIEW:   CBC  Recent Labs  Lab 10/15/19 0801  WBC 5.6  HGB 8.8*  HCT 25.1*  PLT 154    Chemistries  Recent Labs  Lab 10/12/19 0410 10/13/19 0613 10/14/19 0544  NA 137   < >  136  K 3.1*   < > 3.6  CL 98   < > 97*  CO2 29   < > 31  GLUCOSE 85   < > 78  BUN 22   < > 20  CREATININE 4.55*   < > 4.32*  CALCIUM 8.3*   < > 7.9*  AST 55*  --   --   ALT 14  --   --   ALKPHOS 41  --   --   BILITOT 1.1  --   --    < > = values in this interval not displayed.    Microbiology Results   Recent Results (from the past 240 hour(s))  SARS Coronavirus 2 by RT PCR (hospital order, performed in [facility] hospital lab) Nasopharyngeal Nasopharyngeal Swab     Status: None   Collection Time: 10/08/19  2:29 PM   Specimen: Nasopharyngeal Swab  Result Value Ref Range Status   SARS Coronavirus 2 NEGATIVE NEGATIVE Final    Comment: (NOTE) SARS-CoV-2 target nucleic acids are NOT DETECTED.  The SARS-CoV-2 RNA is generally detectable in upper and lower respiratory specimens during the acute phase of infection. The lowest concentration of SARS-CoV-2 viral copies this assay can detect is 250 copies / mL. A negative result does not preclude SARS-CoV-2 infection and should not be used as the sole basis for treatment or other patient management decisions.  A negative result may occur with improper specimen collection / handling, submission of specimen other than nasopharyngeal swab, presence of viral mutation(s) within the areas targeted by this assay, and inadequate number of viral copies (<250 copies / mL). A negative result must be combined with  clinical observations, patient history, and epidemiological information.  Fact Sheet for Patients:   StrictlyIdeas.no  Fact Sheet for Healthcare Providers: BankingDealers.co.za  This test is not yet approved or  cleared by the [place] FDA and has been authorized for detection and/or diagnosis of SARS-CoV-2 by FDA under an Emergency Use Authorization (EUA).  This EUA will remain in effect (meaning this test can be used) for the duration of the COVID-19 declaration under Section 564(b)(1) of the Act, 21 U.S.C. section 360bbb-3(b)(1), unless the authorization is terminated or revoked sooner.  Performed at [facility], [address] [place], [place] [address]   Urine Culture     Status: Abnormal   Collection Time: 10/08/19  2:31 PM   Specimen: Urine, Random  Result Value Ref Range Status   Specimen Description   Final    URINE, RANDOM Performed at [facility], [address], [place], [place] [address]    Special Requests   Final    NONE Performed at [facility], [address] [place],  [address]    Culture >=100,000 COLONIES/mL ESCHERICHIA COLI (A)  Final   Report Status 10/10/2019 FINAL  Final   Organism ID, Bacteria ESCHERICHIA COLI (A)  Final      Susceptibility   Escherichia coli - MIC*    AMPICILLIN >=32 RESISTANT Resistant     CEFAZOLIN <=4 SENSITIVE Sensitive     CEFTRIAXONE <=0.25 SENSITIVE Sensitive     CIPROFLOXACIN INTERMEDIATE Intermediate     GENTAMICIN <=1 SENSITIVE Sensitive     IMIPENEM <=0.25 SENSITIVE Sensitive     NITROFURANTOIN <=16 SENSITIVE Sensitive     TRIMETH/SULFA >=320 RESISTANT Resistant     AMPICILLIN/SULBACTAM 16 INTERMEDIATE Intermediate     PIP/TAZO <=4 SENSITIVE Sensitive     * >=100,000 COLONIES/mL ESCHERICHIA COLI  MRSA PCR Screening     Status: None   Collection Time: 10/09/19  2:46 AM   Specimen: Nasopharyngeal  Result Value Ref Range  Status   MRSA by PCR NEGATIVE NEGATIVE Final    Comment:        The GeneXpert MRSA Assay (FDA approved for NASAL specimens only), is one component of a comprehensive MRSA colonization surveillance program. It is not intended to diagnose MRSA infection nor to guide or monitor treatment for MRSA infections. Performed at [facility], [address] [place], [place] [address]     RADIOLOGY:  PERIPHERAL VASCULAR CATHETERIZATION  Result Date: 10/13/2019 See op note    CODE STATUS:     Code Status Orders  (From admission, onward)         Start     Ordered   10/14/19 1101  Do not attempt resuscitation (DNR)  Continuous       Question Answer Comment  In the event of cardiac or respiratory ARREST Do not call a "code blue"   In the event of cardiac or respiratory ARREST Do not perform Intubation, CPR, defibrillation or ACLS   In the event of cardiac or respiratory ARREST Use medication by any route, position, wound care, and other measures to relive pain and suffering. May use oxygen, suction and manual treatment of airway obstruction as needed for comfort.      10/14/19 1100        Code Status History    Date Active Date Inactive Code Status Order ID Comments User Context   10/08/2019 1508 10/14/2019 1100 Full Code [id number]  Loletha Grayer, MD ED   09/09/2019 0359 09/10/2019 2011 Full Code [id number]  Athena Masse, MD ED   06/29/2019 2028 07/01/2019 0108 Full Code [id number]  Ivor Costa, MD Inpatient   02/21/2019 0932 04/21/2019 0155 DNR [id number]  Sidney Ace, MD Inpatient   02/09/2019 1755 02/21/2019 0932 Full Code [id number]  Thornell Mule, MD ED   08/24/2018 0022 09/15/2018 1720 Full Code [id number]  Shela Leff, MD Inpatient   09/04/2017 1510 09/06/2017 1920 Full Code [id number]  Epifanio Lesches, MD ED   02/15/2017 1243 02/16/2017 2023 Full Code [id number]  Saundra Shelling, MD Inpatient   10/23/2015 0828 10/23/2015 0908 Full Code [id number]  Saundra Shelling,  MD Inpatient   09/03/2015 0130 09/03/2015 1619 Full Code [id number]  Lance Coon, MD Inpatient   07/02/2013 1327 07/14/2013 1540 Full Code [id number]  Nani Skillern, PA-C Inpatient   06/29/2013 2153 07/02/2013 1327 Full Code [id number]  Larey Dresser, MD Inpatient   Advance Care Planning Activity       TOTAL TIME TAKING CARE OF THIS PATIENT: *35* minutes.    [name] M.D  [facility]    CC: Primary care physician; [name], MD

## 2019-10-15 NOTE — Progress Notes (Signed)
[place] for Apixaban  Indication: atrial fibrillation  Allergies  Allergen Reactions  . Nsaids Other (See Comments)    Contraindicated due to kidney disease.  Judeth Cornfield Reductase Inhibitors   . Doxycycline Other (See Comments)    tremor   Patient Measurements: Height: 5\' 4"  (162.6 cm) Weight: 74.2 kg (163 lb 9.3 oz) IBW/kg (Calculated) : 54.7  Vital Signs: Temp: 98.9 F (37.2 C) (08/12 0457) Temp Source: Oral (08/12 0457) BP: 158/51 (08/12 0936) Pulse Rate: 63 (08/12 0936)  Labs: Recent Labs    10/13/19 0613 10/14/19 0544 10/14/19 0544 10/14/19 1120 10/15/19 0801  HGB  --  7.7*   < > 6.7* 8.8*  HCT  --  25.0*  --  20.2* 25.1*  PLT  --  146*  --  134* 154  CREATININE 3.29* 4.32*  --   --   --    < > = values in this interval not displayed.    Estimated Creatinine Clearance: 12.8 mL/min (A) (by C-G formula based on SCr of 4.32 mg/dL (H)).   Medical History: Past Medical History:  Diagnosis Date  . Anemia   . Arthritis    upper back and neck   . Chronic diastolic CHF (congestive heart failure) ([place])    a. Due to ischemic cardiomyopathy. EF as low as 35%, improved to normal s/p CABG; b. echo 07/06/13: EF 55-60%, no RWMAs, mod TR, trivial pericardial effusion not c/w tamponade physiology;  c. 10/2015 Echo: EF 65%, Gr1 DD, triv AI, mild MR, mildly dil LA, mod TR, PASP 65mmHg.  Marland Kitchen Coronary artery disease    a. NSTEMI 06/2013; b.cath: severe three-vessel CAD w/ EF 30% & mild-mod MR; c. s/p 3 vessel CABG 07/02/13 (LIMA-LAD, SVG-OM, and SVG-RPDA);  d. 10/2015 MV: no ischemia/infarct.  . Diabetes mellitus without complication ([place])   . Diabetic neuropathy ([place])   . ESRD (end stage renal disease) ([place])    a. 12/2015 initiated - mwf dialysis.  Marland Kitchen GERD (gastroesophageal reflux disease)   . Hyperlipidemia   . Hypertension   . Hypothyroidism   . Myocardial infarction ([place]) 2014  . Pleural effusion 2015  . Pulmonary hypertension ([place])   .  Wears dentures    full lower    Assessment: Pharmacy consulted for apixaban dosing for 64 yo female with PMH of A. Fib. Patient admitted with nausea and abdominal pain. S/P EGD 8/7. Patient was taking apixaban 5mg  BID prior to admission.   Hgb: 7.7  PLts: 148    Plan:  Eliquis was stopped on 8/10 as a result of perm-cath bleeding when removed and then restarted at 2.5 mg BID. Eliquis was stopped yesterday as a result of Hgb dropping from 7.7 to 6.7. Patient received 1 unit of blood.   Plan to monitor CBCs at least every 3 days per protocol.   Rowland Lathe, PharmD Clinical Pharmacist 10/15/2019 10:30 AM

## 2019-10-16 ENCOUNTER — Encounter: Payer: Self-pay | Admitting: Family Medicine

## 2019-10-16 NOTE — Telephone Encounter (Signed)
Pt husband called to check on the status of the wheelchair

## 2019-10-17 ENCOUNTER — Encounter: Payer: Self-pay | Admitting: Family Medicine

## 2019-10-19 ENCOUNTER — Telehealth: Payer: Self-pay | Admitting: Family Medicine

## 2019-10-19 NOTE — Telephone Encounter (Signed)
Pharmacy has been notified.

## 2019-10-19 NOTE — Telephone Encounter (Signed)
Please call the pharmacy and see what interaction they are concerned about.  Please also find out the exact prescription for the fluconazole.  Is it a single dose or is it multiple doses?

## 2019-10-19 NOTE — Telephone Encounter (Signed)
Pharmacy stated the interaction may increase the effects on abnormal heart beats. Also it would be for the fluconazole 100mg  once a day for two days. Pharmacy wants to know if provider is ok with interaction.

## 2019-10-19 NOTE — Telephone Encounter (Signed)
Ok to refill 

## 2019-10-19 NOTE — Telephone Encounter (Signed)
It is ok for them to fill the fluconazole as it is only a 2 day course.

## 2019-10-19 NOTE — Telephone Encounter (Signed)
Patient needs a refill on one of the medication's from the hospital called, Fluconazole. [facility] will not fill because of other interactions with her other medication. Please advise.

## 2019-10-20 ENCOUNTER — Telehealth: Payer: Self-pay

## 2019-10-20 ENCOUNTER — Encounter: Payer: Self-pay | Admitting: Gastroenterology

## 2019-10-20 ENCOUNTER — Encounter: Payer: Self-pay | Admitting: Family Medicine

## 2019-10-20 ENCOUNTER — Telehealth: Payer: Medicare Other | Admitting: Family Medicine

## 2019-10-20 NOTE — Telephone Encounter (Signed)
Noted  

## 2019-10-21 ENCOUNTER — Telehealth: Payer: Medicare Other | Admitting: Family Medicine

## 2019-10-22 ENCOUNTER — Ambulatory Visit: Payer: Medicare Other | Admitting: Nurse Practitioner

## 2019-10-23 ENCOUNTER — Telehealth: Payer: Self-pay | Admitting: Adult Health Nurse Practitioner

## 2019-10-23 NOTE — Telephone Encounter (Signed)
Called patient to schedule the Palliative Consult, no answer - left message with reason for call along with my name and contact number, requesting a return call.

## 2019-10-27 ENCOUNTER — Ambulatory Visit: Payer: Medicare Other | Admitting: Nurse Practitioner

## 2019-10-30 ENCOUNTER — Other Ambulatory Visit: Payer: Self-pay | Admitting: Family Medicine

## 2019-10-30 NOTE — Telephone Encounter (Signed)
[name], CMA to Jamie Arias      1:24 PM Yes we sent the order to [facility] I will call and see the status of the wheelchari and let you know.  Nina,cma   Last read by Jamie Arias at 4:57 PM on 10/25/2019. October 16, 2019 Jamie Arias to [name], MD      5:07 PM I asked Dr Josephina Gip about getting me a wheelchair. I was sending this message to find out if if Dr. Josephina Gip had found out about me getting a wheelchair.

## 2019-11-03 ENCOUNTER — Telehealth (INDEPENDENT_AMBULATORY_CARE_PROVIDER_SITE_OTHER): Payer: Medicare Other | Admitting: Family Medicine

## 2019-11-03 ENCOUNTER — Encounter: Payer: Self-pay | Admitting: Family Medicine

## 2019-11-03 ENCOUNTER — Other Ambulatory Visit: Payer: Self-pay

## 2019-11-03 DIAGNOSIS — R4189 Other symptoms and signs involving cognitive functions and awareness: Secondary | ICD-10-CM

## 2019-11-03 DIAGNOSIS — R251 Tremor, unspecified: Secondary | ICD-10-CM | POA: Insufficient documentation

## 2019-11-03 DIAGNOSIS — Z515 Encounter for palliative care: Secondary | ICD-10-CM | POA: Diagnosis not present

## 2019-11-03 DIAGNOSIS — N186 End stage renal disease: Secondary | ICD-10-CM

## 2019-11-03 DIAGNOSIS — Z992 Dependence on renal dialysis: Secondary | ICD-10-CM

## 2019-11-03 DIAGNOSIS — R109 Unspecified abdominal pain: Secondary | ICD-10-CM

## 2019-11-03 DIAGNOSIS — Z7409 Other reduced mobility: Secondary | ICD-10-CM | POA: Diagnosis not present

## 2019-11-03 DIAGNOSIS — D631 Anemia in chronic kidney disease: Secondary | ICD-10-CM

## 2019-11-03 NOTE — Assessment & Plan Note (Signed)
Altered mental status possibly related to the Rocephin though otherwise no specific causes noted.  She is had no issues since discharge home.

## 2019-11-03 NOTE — Assessment & Plan Note (Signed)
I have not heard anything regarding the wheelchair.  Her husband will try to figure out who the wheelchair request went to.  I will send a message to my CMA to check on this as well.

## 2019-11-03 NOTE — Progress Notes (Signed)
Virtual Visit via video Note  This visit type was conducted due to national recommendations for restrictions regarding the COVID-19 pandemic (e.g. social distancing).  This format is felt to be most appropriate for this patient at this time.  All issues noted in this document were discussed and addressed.  No physical exam was performed (except for noted visual exam findings with Video Visits).   I connected with Jamie Arias today at 11:30 AM EDT by a video enabled telemedicine application or telephone and verified that I am speaking with the correct person using two identifiers. Location patient: home Location provider: work  Persons participating in the virtual visit: patient, provider, Lamonica Trueba (husband)  I discussed the limitations, risks, security and privacy concerns of performing an evaluation and management service by telephone and the availability of in person appointments. I also discussed with the patient that there may be a patient responsible charge related to this service. The patient expressed understanding and agreed to proceed.  Reason for visit: follow-up  HPI: Nausea, vomiting, abdominal pain: Patient hospitalized related to this.  She has done well since returning home.  She has not had any abdominal pain.  No vomiting.  Minimal nausea.  She was also found to have altered mental status while she is in the hospital.  She had negative MRI.  She had been treated with Rocephin for UTI and nephrology noted Rocephin can sometimes cause confusion and thus this was discontinued.  Patient has not had any recurrent confusion.  Patient noted no UTI symptoms today.  She was also found to be anemic and her Eliquis was held.  She was transfused 1 unit.  She notes they have been doing labs through dialysis though she does not know the results.  Palliative care attempted to contact the patient previously though had to leave a message and the patient was not aware of this.  Jaw tremor:  Patient and her husband both note this is been going on for about 6 months.  They asked about it in the hospital and no specific cause was given to them.  They also note that the patient has not received her wheelchair yet.   ROS: See pertinent positives and negatives per HPI.  Past Medical History:  Diagnosis Date  . Anemia   . Arm DVT (deep venous thromboembolism), acute, left ([place]) 02/13/2019  . Arthritis    upper back and neck   . Chronic diastolic CHF (congestive heart failure) ([place])    a. Due to ischemic cardiomyopathy. EF as low as 35%, improved to normal s/p CABG; b. echo 07/06/13: EF 55-60%, no RWMAs, mod TR, trivial pericardial effusion not c/w tamponade physiology;  c. 10/2015 Echo: EF 65%, Gr1 DD, triv AI, mild MR, mildly dil LA, mod TR, PASP 51mmHg.  Marland Kitchen Coronary artery disease    a. NSTEMI 06/2013; b.cath: severe three-vessel CAD w/ EF 30% & mild-mod MR; c. s/p 3 vessel CABG 07/02/13 (LIMA-LAD, SVG-OM, and SVG-RPDA);  d. 10/2015 MV: no ischemia/infarct.  . Diabetes mellitus without complication ([place])   . Diabetic neuropathy ([place])   . ESRD (end stage renal disease) ([place])    a. 12/2015 initiated - mwf dialysis.  Marland Kitchen GERD (gastroesophageal reflux disease)   . Hyperlipidemia   . Hypertension   . Hypothyroidism   . Myocardial infarction ([place]) 2014  . Pleural effusion 2015  . Pulmonary hypertension ([place])   . Wears dentures    full lower    Past Surgical History:  Procedure Laterality Date  .  A/V FISTULAGRAM Right 12/17/2016   Procedure: A/V Fistulagram;  Surgeon: Algernon Huxley, MD;  Location: [facility];  Service: Cardiovascular;  Laterality: Right;  . A/V FISTULAGRAM Right 01/07/2017   Procedure: A/V Fistulagram;  Surgeon: Algernon Huxley, MD;  Location: [place] [facility];  Service: Cardiovascular;  Laterality: Right;  . A/V FISTULAGRAM Right 12/03/2017   Procedure: A/V FISTULAGRAM;  Surgeon: Katha Cabal, MD;  Location: [facility] [place] [facility];  Service:  Cardiovascular;  Laterality: Right;  . A/V FISTULAGRAM Right 02/23/2019   Procedure: A/V Fistulagram;  Surgeon: Algernon Huxley, MD;  Location: [facility];  Service: Cardiovascular;  Laterality: Right;  . A/V SHUNT INTERVENTION N/A 02/22/2017   Procedure: A/V SHUNT INTERVENTION;  Surgeon: Algernon Huxley, MD;  Location: [place] [facility];  Service: Cardiovascular;  Laterality: N/A;  . AV FISTULA PLACEMENT Right 02/03/2016   Procedure: INSERTION OF ARTERIOVENOUS (AV) GORE-TEX GRAFT ARM ( BRACH / AXILLARY );  Surgeon: Katha Cabal, MD;  Location: [facility];  Service: Vascular;  Laterality: Right;  . AV FISTULA PLACEMENT Left 07/09/2019   Procedure: INSERTION OF ARTERIOVENOUS (AV) GORE-TEX GRAFT ARM ( BRACHIAL AXILLARY );  Surgeon: Algernon Huxley, MD;  Location: [facility];  Service: Vascular;  Laterality: Left;  . [place] REMOVAL Right 03/04/2019   Procedure: REMOVAL OF ARTERIOVENOUS GORETEX GRAFT ([place]);  Surgeon: Algernon Huxley, MD;  Location: [facility];  Service: Vascular;  Laterality: Right;  . CARDIAC CATHETERIZATION    . CATARACT EXTRACTION Bilateral   . CHOLECYSTECTOMY N/A 12/09/2014   Procedure: LAPAROSCOPIC CHOLECYSTECTOMY;  Surgeon: Marlyce [name], MD;  Location: [facility];  Service: General;  Laterality: N/A;  . CORONARY ARTERY BYPASS GRAFT N/A 07/02/2013   Procedure: CORONARY ARTERY BYPASS GRAFTING (CABG);  Surgeon: Ivin [name], MD;  Location: [facility];  Service: Open Heart Surgery;  Laterality: N/A;  CABG x three, using left internal mammary artery and right leg greater saphenous vein harvested endoscopically  . DIALYSIS/PERMA CATHETER REMOVAL N/A 10/13/2019   Procedure: DIALYSIS/PERMA CATHETER REMOVAL;  Surgeon: Algernon Huxley, MD;  Location: [place] [place] [facility];  Service: Cardiovascular;  Laterality: N/A;  . ESOPHAGOGASTRODUODENOSCOPY N/A 04/11/2019   Procedure: ESOPHAGOGASTRODUODENOSCOPY (EGD);  Surgeon: Jonathon Bellows, MD;  Location: [facility];  Service: Gastroenterology;   Laterality: N/A;  . ESOPHAGOGASTRODUODENOSCOPY (EGD) WITH PROPOFOL N/A 11/24/2015   Procedure: ESOPHAGOGASTRODUODENOSCOPY (EGD) WITH PROPOFOL;  Surgeon: [name], MD;  Location: [facility];  Service: Endoscopy;  Laterality: N/A;  Diabetic - insulin  . ESOPHAGOGASTRODUODENOSCOPY (EGD) WITH PROPOFOL N/A 10/10/2019   Procedure: ESOPHAGOGASTRODUODENOSCOPY (EGD) WITH PROPOFOL;  Surgeon: Virgel Manifold, MD;  Location: [facility];  Service: Endoscopy;  Laterality: N/A;  . EYE SURGERY Bilateral    Cataract Extraction with IOL  . INTRAOPERATIVE TRANSESOPHAGEAL ECHOCARDIOGRAM N/A 07/02/2013   Procedure: INTRAOPERATIVE TRANSESOPHAGEAL ECHOCARDIOGRAM;  Surgeon: Ivin [name], MD;  Location: [facility];  Service: Open Heart Surgery;  Laterality: N/A;  . PEG PLACEMENT N/A 03/03/2019   Procedure: PERCUTANEOUS ENDOSCOPIC GASTROSTOMY (PEG) PLACEMENT;  Surgeon: Virgel Manifold, MD;  Location: [facility];  Service: Endoscopy;  Laterality: N/A;  . PERIPHERAL VASCULAR CATHETERIZATION Right 12/06/2015   Procedure: Dialysis/Perma Catheter Insertion;  Surgeon: Katha Cabal, MD;  Location: [place] [facility];  Service: Cardiovascular;  Laterality: Right;  . PERIPHERAL VASCULAR THROMBECTOMY Right 09/28/2016   Procedure: Peripheral Vascular Thrombectomy;  Surgeon: Katha Cabal, MD;  Location: [place] [facility];  Service: Cardiovascular;  Laterality: Right;  .  PERIPHERAL VASCULAR THROMBECTOMY Right 05/20/2018   Procedure: PERIPHERAL VASCULAR THROMBECTOMY;  Surgeon: Katha Cabal, MD;  Location: [place] [facility];  Service: Cardiovascular;  Laterality: Right;  . PORTA CATH REMOVAL N/A 06/01/2016   Procedure: Glori [name] Cath Removal;  Surgeon: Katha Cabal, MD;  Location: [place] [facility];  Service: Cardiovascular;  Laterality: N/A;  . THORACENTESIS Left 2015    Family History  Problem Relation Age of Onset  . COPD Mother   . Cancer Mother        Lung  . Pulmonary  embolism Father   . Diabetes Father   . Diabetes Paternal Grandfather   . Heart disease Maternal Grandmother   . Colon cancer Neg Hx   . Colon polyps Neg Hx   . Esophageal cancer Neg Hx   . Pancreatic cancer Neg Hx   . Liver disease Neg Hx     SOCIAL HX: Non-smoker   Current Outpatient Medications:  .  acetaminophen (TYLENOL) 325 MG tablet, Take 650 mg by mouth daily as needed for moderate pain or headache. , Disp: , Rfl:  .  atorvastatin (LIPITOR) 40 MG tablet, Take 1 tablet (40 mg total) by mouth daily., Disp: 90 tablet, Rfl: 1 .  FLUoxetine (PROZAC) 20 MG capsule, Take 1 capsule (20 mg total) by mouth daily., Disp: 90 capsule, Rfl: 1 .  fluticasone (FLONASE) 50 MCG/ACT nasal spray, Place 2 sprays into both nostrils daily. (Patient taking differently: Place 2 sprays into both nostrils daily as needed for allergies or rhinitis. ), Disp: 16 g, Rfl: 6 .  folic acid (FOLVITE) 1 MG tablet, Take 1 tablet (1 mg total) by mouth daily., Disp:  , Rfl:  .  gabapentin (NEURONTIN) 100 MG capsule, Take 100 mg by mouth 3 (three) times daily., Disp: , Rfl:  .  hydrALAZINE (APRESOLINE) 10 MG tablet, Take 1 tablet (10 mg total) by mouth in the morning and at bedtime., Disp: 180 tablet, Rfl: 0 .  hydrocortisone cream 1 %, Apply topically as needed for itching., Disp: 30 g, Rfl: 0 .  levothyroxine (SYNTHROID) 88 MCG tablet, Take 1 tablet (88 mcg total) by mouth daily before breakfast., Disp: 90 tablet, Rfl: 1 .  lidocaine-prilocaine (EMLA) cream, Apply 1 application topically as needed., Disp: , Rfl:  .  Melatonin 3 MG TABS, Take 3 mg by mouth at bedtime as needed (sleep). , Disp: , Rfl:  .  multivitamin (RENA-VIT) TABS tablet, Take 1 tablet by mouth at bedtime., Disp: , Rfl: 0 .  nitroGLYCERIN (NITROSTAT) 0.4 MG SL tablet, DISSOLVE ONE TABLET UNDER THE TONGUE EVERY 5 MINUTES AS NEEDED FOR CHEST PAIN.  DO NOT EXCEED A TOTAL OF 3 DOSES IN 15 MINUTES (Patient taking differently: Place 0.4 mg under the tongue  every 5 (five) minutes as needed for chest pain. DISSOLVE ONE TABLET UNDER THE TONGUE EVERY 5 MINUTES AS NEEDED FOR CHEST PAIN.  DO NOT EXCEED A TOTAL OF 3 DOSES IN 15 MINUTES), Disp: 25 tablet, Rfl: 0 .  Nutritional Supplements (FEEDING SUPPLEMENT, NEPRO CARB STEADY,) LIQD, Take 237 mLs by mouth 2 (two) times daily between meals., Disp: 237 mL, Rfl: 0 .  omeprazole (PRILOSEC) 20 MG capsule, Take 20 mg by mouth daily., Disp: , Rfl:  .  ondansetron (ZOFRAN-ODT) 4 MG disintegrating tablet, Take 1 tablet (4 mg total) by mouth every 8 (eight) hours as needed for nausea or vomiting. Patient needs appointment with PCP to receive further refills., Disp: 30 tablet, Rfl: 0 .  PACERONE  200 MG tablet, TAKE 1 TABLET BY MOUTH ONCE DAILY. PATIENT NEEDS APPOINTMENT WITH PCP FOR FURTHER REFILLS (Patient taking differently: Take 200 mg by mouth daily. ), Disp: 30 tablet, Rfl: 0 .  traZODone (DESYREL) 50 MG tablet, TAKE 1 TABLET BY MOUTH AT BEDTIME (NEEDS  APPOINTMENT  FOR  FURTHER  REFILLS), Disp: 30 tablet, Rfl: 0 .  vitamin C (VITAMIN C) 500 MG tablet, Take 1 tablet (500 mg total) by mouth daily., Disp:  , Rfl:   EXAM:  VITALS per patient if applicable:  GENERAL: alert, oriented, appears well and in no acute distress  HEENT: atraumatic, conjunttiva clear, no obvious abnormalities on inspection of external nose and ears  NECK: normal movements of the head and neck  LUNGS: on inspection no signs of respiratory distress, breathing rate appears normal, no obvious gross SOB, gasping or wheezing  CV: no obvious cyanosis  MS: moves all visible extremities without noticeable abnormality  PSYCH/NEURO: pleasant and cooperative, no obvious depression or anxiety, speech and thought processing grossly intact, jaw tremor noted  ASSESSMENT AND PLAN:  Discussed the following assessment and plan:  Abdominal pain Resolved.  Minimal nausea.  She will monitor for recurrence.  Mobility impaired I have not heard  anything regarding the wheelchair.  Her husband will try to figure out who the wheelchair request went to.  I will send a message to my CMA to check on this as well.  Palliative care by specialist Message sent to palliative care to try to contact the patient's husband to set this up.  Unresponsive episode Altered mental status possibly related to the Rocephin though otherwise no specific causes noted.  She is had no issues since discharge home.  Anemia in chronic kidney disease Was low in the hospital.  Responded to blood transfusion.  We will try to request lab records from nephrology.  She will remain off of Eliquis for now.  Tremor Apparent tremor in her jaw.  Will refer to neurology.  Possibly related to her Prozac based on review of possible side effects.   Orders Placed This Encounter  Procedures  . Ambulatory referral to Neurology    Referral Priority:   Routine    Referral Type:   Consultation    Referral Reason:   Specialty Services Required    Requested Specialty:   Neurology    Number of Visits Requested:   1    No orders of the defined types were placed in this encounter.    I discussed the assessment and treatment plan with the patient. The patient was provided an opportunity to ask questions and all were answered. The patient agreed with the plan and demonstrated an understanding of the instructions.   The patient was advised to call back or seek an in-person evaluation if the symptoms worsen or if the condition fails to improve as anticipated.   [name], MD

## 2019-11-03 NOTE — Assessment & Plan Note (Addendum)
Was low in the hospital.  Responded to blood transfusion.  We will try to request lab records from nephrology.  She will remain off of Eliquis for now.

## 2019-11-03 NOTE — Assessment & Plan Note (Signed)
Apparent tremor in her jaw.  Will refer to neurology.  Possibly related to her Prozac based on review of possible side effects.

## 2019-11-03 NOTE — Assessment & Plan Note (Signed)
Resolved.  Minimal nausea.  She will monitor for recurrence.

## 2019-11-03 NOTE — Assessment & Plan Note (Signed)
Message sent to palliative care to try to contact the patient's husband to set this up.

## 2019-11-04 ENCOUNTER — Telehealth: Payer: Self-pay | Admitting: Family Medicine

## 2019-11-04 DIAGNOSIS — R52 Pain, unspecified: Secondary | ICD-10-CM | POA: Diagnosis not present

## 2019-11-04 DIAGNOSIS — N186 End stage renal disease: Secondary | ICD-10-CM | POA: Diagnosis not present

## 2019-11-04 DIAGNOSIS — E039 Hypothyroidism, unspecified: Secondary | ICD-10-CM | POA: Diagnosis not present

## 2019-11-04 DIAGNOSIS — Z992 Dependence on renal dialysis: Secondary | ICD-10-CM | POA: Diagnosis not present

## 2019-11-04 DIAGNOSIS — E1122 Type 2 diabetes mellitus with diabetic chronic kidney disease: Secondary | ICD-10-CM | POA: Diagnosis not present

## 2019-11-04 DIAGNOSIS — N2581 Secondary hyperparathyroidism of renal origin: Secondary | ICD-10-CM | POA: Diagnosis not present

## 2019-11-04 NOTE — Telephone Encounter (Signed)
[facility] called to report a missed visit on 8/26 because patient had a doctors appt

## 2019-11-05 ENCOUNTER — Ambulatory Visit: Payer: Medicare Other | Admitting: Nurse Practitioner

## 2019-11-05 ENCOUNTER — Telehealth: Payer: Self-pay | Admitting: Family Medicine

## 2019-11-05 DIAGNOSIS — D638 Anemia in other chronic diseases classified elsewhere: Secondary | ICD-10-CM

## 2019-11-05 NOTE — Telephone Encounter (Signed)
[facility] called to report a missed visit on 8/26 because patient had a doctors appt .  Evoleht Hovatter,cma

## 2019-11-05 NOTE — Progress Notes (Signed)
I sent the verbal request for the labs but I printed off the labs I saw in care everywhere for you if it will help also and its dated labs 11/02/2019 I put it in the lab basket for you to review.  Trivia Heffelfinger,cma

## 2019-11-05 NOTE — Telephone Encounter (Signed)
Noted  

## 2019-11-05 NOTE — Progress Notes (Signed)
I faxed everything to [facility] and they stated they made 4 or mor attempts by phone to reach the patient and no one answered I called and spoke with Jamie Arias and informed him and gave him the number to call.  This is in one of the telephone encounters. I will call the nephrology and get the labs.  Jamie Arias,cma

## 2019-11-05 NOTE — Progress Notes (Signed)
Faxed the verbal request form labs to [facility] [phone].  Japheth Diekman,cma

## 2019-11-05 NOTE — Telephone Encounter (Signed)
It does not appear that they checked her hemoglobin with her lab work at nephrology.  I would suggest having this rechecked.  I placed orders for this.  Thanks.

## 2019-11-05 NOTE — Telephone Encounter (Signed)
Patient answered and stated she had to wait on her husband to come back and he will schedule the lab appt.  Reynalda Canny,cma

## 2019-11-06 ENCOUNTER — Encounter: Payer: Self-pay | Admitting: Nurse Practitioner

## 2019-11-10 DIAGNOSIS — N2581 Secondary hyperparathyroidism of renal origin: Secondary | ICD-10-CM | POA: Diagnosis not present

## 2019-11-10 DIAGNOSIS — R52 Pain, unspecified: Secondary | ICD-10-CM | POA: Diagnosis not present

## 2019-11-10 DIAGNOSIS — E1122 Type 2 diabetes mellitus with diabetic chronic kidney disease: Secondary | ICD-10-CM | POA: Diagnosis not present

## 2019-11-10 DIAGNOSIS — Z992 Dependence on renal dialysis: Secondary | ICD-10-CM | POA: Diagnosis not present

## 2019-11-10 DIAGNOSIS — N186 End stage renal disease: Secondary | ICD-10-CM | POA: Diagnosis not present

## 2019-11-10 DIAGNOSIS — E039 Hypothyroidism, unspecified: Secondary | ICD-10-CM | POA: Diagnosis not present

## 2019-11-12 ENCOUNTER — Telehealth: Payer: Self-pay | Admitting: Adult Health Nurse Practitioner

## 2019-11-12 ENCOUNTER — Telehealth: Payer: Self-pay | Admitting: Family Medicine

## 2019-11-12 DIAGNOSIS — I2510 Atherosclerotic heart disease of native coronary artery without angina pectoris: Secondary | ICD-10-CM | POA: Diagnosis not present

## 2019-11-12 DIAGNOSIS — N186 End stage renal disease: Secondary | ICD-10-CM | POA: Diagnosis not present

## 2019-11-12 DIAGNOSIS — E785 Hyperlipidemia, unspecified: Secondary | ICD-10-CM | POA: Diagnosis not present

## 2019-11-12 DIAGNOSIS — I132 Hypertensive heart and chronic kidney disease with heart failure and with stage 5 chronic kidney disease, or end stage renal disease: Secondary | ICD-10-CM | POA: Diagnosis not present

## 2019-11-12 DIAGNOSIS — B372 Candidiasis of skin and nail: Secondary | ICD-10-CM | POA: Diagnosis not present

## 2019-11-12 DIAGNOSIS — Z7901 Long term (current) use of anticoagulants: Secondary | ICD-10-CM | POA: Diagnosis not present

## 2019-11-12 DIAGNOSIS — E039 Hypothyroidism, unspecified: Secondary | ICD-10-CM | POA: Diagnosis not present

## 2019-11-12 DIAGNOSIS — R1312 Dysphagia, oropharyngeal phase: Secondary | ICD-10-CM | POA: Diagnosis not present

## 2019-11-12 DIAGNOSIS — E1140 Type 2 diabetes mellitus with diabetic neuropathy, unspecified: Secondary | ICD-10-CM | POA: Diagnosis not present

## 2019-11-12 DIAGNOSIS — I4891 Unspecified atrial fibrillation: Secondary | ICD-10-CM | POA: Diagnosis not present

## 2019-11-12 DIAGNOSIS — I5032 Chronic diastolic (congestive) heart failure: Secondary | ICD-10-CM | POA: Diagnosis not present

## 2019-11-12 DIAGNOSIS — Z992 Dependence on renal dialysis: Secondary | ICD-10-CM | POA: Diagnosis not present

## 2019-11-12 DIAGNOSIS — Z951 Presence of aortocoronary bypass graft: Secondary | ICD-10-CM | POA: Diagnosis not present

## 2019-11-12 DIAGNOSIS — D631 Anemia in chronic kidney disease: Secondary | ICD-10-CM | POA: Diagnosis not present

## 2019-11-12 DIAGNOSIS — I82622 Acute embolism and thrombosis of deep veins of left upper extremity: Secondary | ICD-10-CM | POA: Diagnosis not present

## 2019-11-12 DIAGNOSIS — E1122 Type 2 diabetes mellitus with diabetic chronic kidney disease: Secondary | ICD-10-CM | POA: Diagnosis not present

## 2019-11-12 NOTE — Telephone Encounter (Signed)
Called patient to schedule the Palliative Consult, no answer - left message with reason for call along with my name and contact number.  I then called husband's cell and spoke with him regarding Palliative services and all questions were answered and he was in agreement with scheduling visit.  I have scheduled an In-home Consult for 12/01/19 @ 1:30 PM

## 2019-11-12 NOTE — Telephone Encounter (Signed)
Jamie Arias with [facility] called about patient being non-compliant with appts. They have not seen her since 10/27/19. Pt's husband is not answering phone calls but calls the office and tells them not to come to the house. Jamie Arias dropped by today and saw someone standing in the house but they did not answer the door. She states that if they continue to cancel, pt may be discharged. The contact number for Jamie Arias is [phone] and the office number is [phone]. FYI.

## 2019-11-16 DIAGNOSIS — E039 Hypothyroidism, unspecified: Secondary | ICD-10-CM | POA: Diagnosis not present

## 2019-11-16 DIAGNOSIS — N186 End stage renal disease: Secondary | ICD-10-CM | POA: Diagnosis not present

## 2019-11-16 DIAGNOSIS — E1122 Type 2 diabetes mellitus with diabetic chronic kidney disease: Secondary | ICD-10-CM | POA: Diagnosis not present

## 2019-11-16 DIAGNOSIS — N2581 Secondary hyperparathyroidism of renal origin: Secondary | ICD-10-CM | POA: Diagnosis not present

## 2019-11-16 DIAGNOSIS — R52 Pain, unspecified: Secondary | ICD-10-CM | POA: Diagnosis not present

## 2019-11-16 DIAGNOSIS — I4811 Longstanding persistent atrial fibrillation: Secondary | ICD-10-CM | POA: Diagnosis not present

## 2019-11-16 DIAGNOSIS — Z992 Dependence on renal dialysis: Secondary | ICD-10-CM | POA: Diagnosis not present

## 2019-11-16 NOTE — Telephone Encounter (Signed)
Lvm for patient to call back to discuss PT.  Stclair Szymborski,cma

## 2019-11-16 NOTE — Telephone Encounter (Signed)
Noted. Please contact the patient or her husband to see if there is an issue contributing to this. Thanks.

## 2019-11-17 ENCOUNTER — Telehealth: Payer: Self-pay

## 2019-11-17 DIAGNOSIS — E1140 Type 2 diabetes mellitus with diabetic neuropathy, unspecified: Secondary | ICD-10-CM | POA: Diagnosis not present

## 2019-11-17 DIAGNOSIS — I132 Hypertensive heart and chronic kidney disease with heart failure and with stage 5 chronic kidney disease, or end stage renal disease: Secondary | ICD-10-CM | POA: Diagnosis not present

## 2019-11-17 DIAGNOSIS — E1122 Type 2 diabetes mellitus with diabetic chronic kidney disease: Secondary | ICD-10-CM | POA: Diagnosis not present

## 2019-11-17 DIAGNOSIS — B372 Candidiasis of skin and nail: Secondary | ICD-10-CM | POA: Diagnosis not present

## 2019-11-17 DIAGNOSIS — I82622 Acute embolism and thrombosis of deep veins of left upper extremity: Secondary | ICD-10-CM | POA: Diagnosis not present

## 2019-11-17 DIAGNOSIS — I2510 Atherosclerotic heart disease of native coronary artery without angina pectoris: Secondary | ICD-10-CM | POA: Diagnosis not present

## 2019-11-17 DIAGNOSIS — I4891 Unspecified atrial fibrillation: Secondary | ICD-10-CM | POA: Diagnosis not present

## 2019-11-17 DIAGNOSIS — I5032 Chronic diastolic (congestive) heart failure: Secondary | ICD-10-CM | POA: Diagnosis not present

## 2019-11-17 DIAGNOSIS — E039 Hypothyroidism, unspecified: Secondary | ICD-10-CM | POA: Diagnosis not present

## 2019-11-17 DIAGNOSIS — N186 End stage renal disease: Secondary | ICD-10-CM | POA: Diagnosis not present

## 2019-11-17 DIAGNOSIS — E785 Hyperlipidemia, unspecified: Secondary | ICD-10-CM | POA: Diagnosis not present

## 2019-11-17 DIAGNOSIS — Z951 Presence of aortocoronary bypass graft: Secondary | ICD-10-CM | POA: Diagnosis not present

## 2019-11-17 DIAGNOSIS — R1312 Dysphagia, oropharyngeal phase: Secondary | ICD-10-CM | POA: Diagnosis not present

## 2019-11-17 DIAGNOSIS — Z7901 Long term (current) use of anticoagulants: Secondary | ICD-10-CM | POA: Diagnosis not present

## 2019-11-17 DIAGNOSIS — D631 Anemia in chronic kidney disease: Secondary | ICD-10-CM | POA: Diagnosis not present

## 2019-11-17 DIAGNOSIS — Z992 Dependence on renal dialysis: Secondary | ICD-10-CM | POA: Diagnosis not present

## 2019-11-17 NOTE — Telephone Encounter (Signed)
I called and spoke with [name] and she will recheck it on Thursday and let me know how it is.  Jamie Arias,cma

## 2019-11-17 NOTE — Telephone Encounter (Signed)
Noted. Can they recheck her BP again?If her diastolic BP remains that low she will need to be seen in person to help determine a cause.

## 2019-11-20 DIAGNOSIS — N186 End stage renal disease: Secondary | ICD-10-CM | POA: Diagnosis not present

## 2019-11-20 DIAGNOSIS — E1122 Type 2 diabetes mellitus with diabetic chronic kidney disease: Secondary | ICD-10-CM | POA: Diagnosis not present

## 2019-11-20 DIAGNOSIS — N2581 Secondary hyperparathyroidism of renal origin: Secondary | ICD-10-CM | POA: Diagnosis not present

## 2019-11-20 DIAGNOSIS — R52 Pain, unspecified: Secondary | ICD-10-CM | POA: Diagnosis not present

## 2019-11-20 DIAGNOSIS — Z992 Dependence on renal dialysis: Secondary | ICD-10-CM | POA: Diagnosis not present

## 2019-11-20 DIAGNOSIS — E039 Hypothyroidism, unspecified: Secondary | ICD-10-CM | POA: Diagnosis not present

## 2019-11-20 NOTE — Telephone Encounter (Signed)
Can you follow-up and see what her blood pressure is now? Thanks.

## 2019-11-23 DIAGNOSIS — Z992 Dependence on renal dialysis: Secondary | ICD-10-CM | POA: Diagnosis not present

## 2019-11-23 DIAGNOSIS — N186 End stage renal disease: Secondary | ICD-10-CM | POA: Diagnosis not present

## 2019-11-23 DIAGNOSIS — N2581 Secondary hyperparathyroidism of renal origin: Secondary | ICD-10-CM | POA: Diagnosis not present

## 2019-11-23 DIAGNOSIS — E039 Hypothyroidism, unspecified: Secondary | ICD-10-CM | POA: Diagnosis not present

## 2019-11-23 DIAGNOSIS — E1122 Type 2 diabetes mellitus with diabetic chronic kidney disease: Secondary | ICD-10-CM | POA: Diagnosis not present

## 2019-11-23 DIAGNOSIS — R52 Pain, unspecified: Secondary | ICD-10-CM | POA: Diagnosis not present

## 2019-11-24 ENCOUNTER — Other Ambulatory Visit: Payer: Self-pay | Admitting: Family Medicine

## 2019-11-24 ENCOUNTER — Encounter: Payer: Self-pay | Admitting: Neurology

## 2019-11-24 DIAGNOSIS — I82622 Acute embolism and thrombosis of deep veins of left upper extremity: Secondary | ICD-10-CM | POA: Diagnosis not present

## 2019-11-24 DIAGNOSIS — E039 Hypothyroidism, unspecified: Secondary | ICD-10-CM | POA: Diagnosis not present

## 2019-11-24 DIAGNOSIS — I4891 Unspecified atrial fibrillation: Secondary | ICD-10-CM | POA: Diagnosis not present

## 2019-11-24 DIAGNOSIS — I2510 Atherosclerotic heart disease of native coronary artery without angina pectoris: Secondary | ICD-10-CM | POA: Diagnosis not present

## 2019-11-24 DIAGNOSIS — D631 Anemia in chronic kidney disease: Secondary | ICD-10-CM | POA: Diagnosis not present

## 2019-11-24 DIAGNOSIS — Z951 Presence of aortocoronary bypass graft: Secondary | ICD-10-CM | POA: Diagnosis not present

## 2019-11-24 DIAGNOSIS — E1140 Type 2 diabetes mellitus with diabetic neuropathy, unspecified: Secondary | ICD-10-CM | POA: Diagnosis not present

## 2019-11-24 DIAGNOSIS — E785 Hyperlipidemia, unspecified: Secondary | ICD-10-CM | POA: Diagnosis not present

## 2019-11-24 DIAGNOSIS — N186 End stage renal disease: Secondary | ICD-10-CM | POA: Diagnosis not present

## 2019-11-24 DIAGNOSIS — Z7901 Long term (current) use of anticoagulants: Secondary | ICD-10-CM | POA: Diagnosis not present

## 2019-11-24 DIAGNOSIS — R1312 Dysphagia, oropharyngeal phase: Secondary | ICD-10-CM | POA: Diagnosis not present

## 2019-11-24 DIAGNOSIS — B372 Candidiasis of skin and nail: Secondary | ICD-10-CM | POA: Diagnosis not present

## 2019-11-24 DIAGNOSIS — Z992 Dependence on renal dialysis: Secondary | ICD-10-CM | POA: Diagnosis not present

## 2019-11-24 DIAGNOSIS — I132 Hypertensive heart and chronic kidney disease with heart failure and with stage 5 chronic kidney disease, or end stage renal disease: Secondary | ICD-10-CM | POA: Diagnosis not present

## 2019-11-24 DIAGNOSIS — I5032 Chronic diastolic (congestive) heart failure: Secondary | ICD-10-CM | POA: Diagnosis not present

## 2019-11-24 DIAGNOSIS — E1122 Type 2 diabetes mellitus with diabetic chronic kidney disease: Secondary | ICD-10-CM | POA: Diagnosis not present

## 2019-11-25 ENCOUNTER — Other Ambulatory Visit: Payer: Self-pay

## 2019-11-25 DIAGNOSIS — D631 Anemia in chronic kidney disease: Secondary | ICD-10-CM | POA: Diagnosis not present

## 2019-11-25 DIAGNOSIS — B372 Candidiasis of skin and nail: Secondary | ICD-10-CM | POA: Diagnosis not present

## 2019-11-25 DIAGNOSIS — I82622 Acute embolism and thrombosis of deep veins of left upper extremity: Secondary | ICD-10-CM | POA: Diagnosis not present

## 2019-11-25 DIAGNOSIS — I2510 Atherosclerotic heart disease of native coronary artery without angina pectoris: Secondary | ICD-10-CM | POA: Diagnosis not present

## 2019-11-25 DIAGNOSIS — E039 Hypothyroidism, unspecified: Secondary | ICD-10-CM | POA: Diagnosis not present

## 2019-11-25 DIAGNOSIS — N186 End stage renal disease: Secondary | ICD-10-CM | POA: Diagnosis not present

## 2019-11-25 DIAGNOSIS — R1312 Dysphagia, oropharyngeal phase: Secondary | ICD-10-CM | POA: Diagnosis not present

## 2019-11-25 DIAGNOSIS — I5032 Chronic diastolic (congestive) heart failure: Secondary | ICD-10-CM | POA: Diagnosis not present

## 2019-11-25 DIAGNOSIS — I4891 Unspecified atrial fibrillation: Secondary | ICD-10-CM | POA: Diagnosis not present

## 2019-11-25 DIAGNOSIS — Z951 Presence of aortocoronary bypass graft: Secondary | ICD-10-CM | POA: Diagnosis not present

## 2019-11-25 DIAGNOSIS — Z7901 Long term (current) use of anticoagulants: Secondary | ICD-10-CM | POA: Diagnosis not present

## 2019-11-25 DIAGNOSIS — I132 Hypertensive heart and chronic kidney disease with heart failure and with stage 5 chronic kidney disease, or end stage renal disease: Secondary | ICD-10-CM | POA: Diagnosis not present

## 2019-11-25 DIAGNOSIS — E1122 Type 2 diabetes mellitus with diabetic chronic kidney disease: Secondary | ICD-10-CM | POA: Diagnosis not present

## 2019-11-25 DIAGNOSIS — E785 Hyperlipidemia, unspecified: Secondary | ICD-10-CM | POA: Diagnosis not present

## 2019-11-25 DIAGNOSIS — E1140 Type 2 diabetes mellitus with diabetic neuropathy, unspecified: Secondary | ICD-10-CM | POA: Diagnosis not present

## 2019-11-25 DIAGNOSIS — Z992 Dependence on renal dialysis: Secondary | ICD-10-CM | POA: Diagnosis not present

## 2019-11-25 MED ORDER — HYDRALAZINE HCL 10 MG PO TABS: 10.0000 mg | ORAL_TABLET | Freq: Two times a day (BID) | ORAL | 0 refills | Status: DC

## 2019-11-26 DIAGNOSIS — Z992 Dependence on renal dialysis: Secondary | ICD-10-CM | POA: Diagnosis not present

## 2019-11-26 DIAGNOSIS — I5032 Chronic diastolic (congestive) heart failure: Secondary | ICD-10-CM | POA: Diagnosis not present

## 2019-11-26 DIAGNOSIS — I2510 Atherosclerotic heart disease of native coronary artery without angina pectoris: Secondary | ICD-10-CM | POA: Diagnosis not present

## 2019-11-26 DIAGNOSIS — E1140 Type 2 diabetes mellitus with diabetic neuropathy, unspecified: Secondary | ICD-10-CM | POA: Diagnosis not present

## 2019-11-26 DIAGNOSIS — I4891 Unspecified atrial fibrillation: Secondary | ICD-10-CM | POA: Diagnosis not present

## 2019-11-26 DIAGNOSIS — E785 Hyperlipidemia, unspecified: Secondary | ICD-10-CM | POA: Diagnosis not present

## 2019-11-26 DIAGNOSIS — E1122 Type 2 diabetes mellitus with diabetic chronic kidney disease: Secondary | ICD-10-CM | POA: Diagnosis not present

## 2019-11-26 DIAGNOSIS — I132 Hypertensive heart and chronic kidney disease with heart failure and with stage 5 chronic kidney disease, or end stage renal disease: Secondary | ICD-10-CM | POA: Diagnosis not present

## 2019-11-26 DIAGNOSIS — Z951 Presence of aortocoronary bypass graft: Secondary | ICD-10-CM | POA: Diagnosis not present

## 2019-11-26 DIAGNOSIS — R1312 Dysphagia, oropharyngeal phase: Secondary | ICD-10-CM | POA: Diagnosis not present

## 2019-11-26 DIAGNOSIS — Z7901 Long term (current) use of anticoagulants: Secondary | ICD-10-CM | POA: Diagnosis not present

## 2019-11-26 DIAGNOSIS — D631 Anemia in chronic kidney disease: Secondary | ICD-10-CM | POA: Diagnosis not present

## 2019-11-26 DIAGNOSIS — N186 End stage renal disease: Secondary | ICD-10-CM | POA: Diagnosis not present

## 2019-11-26 DIAGNOSIS — I82622 Acute embolism and thrombosis of deep veins of left upper extremity: Secondary | ICD-10-CM | POA: Diagnosis not present

## 2019-11-26 DIAGNOSIS — B372 Candidiasis of skin and nail: Secondary | ICD-10-CM | POA: Diagnosis not present

## 2019-11-26 DIAGNOSIS — E039 Hypothyroidism, unspecified: Secondary | ICD-10-CM | POA: Diagnosis not present

## 2019-11-27 DIAGNOSIS — E1122 Type 2 diabetes mellitus with diabetic chronic kidney disease: Secondary | ICD-10-CM | POA: Diagnosis not present

## 2019-11-27 DIAGNOSIS — N2581 Secondary hyperparathyroidism of renal origin: Secondary | ICD-10-CM | POA: Diagnosis not present

## 2019-11-27 DIAGNOSIS — N186 End stage renal disease: Secondary | ICD-10-CM | POA: Diagnosis not present

## 2019-11-27 DIAGNOSIS — Z992 Dependence on renal dialysis: Secondary | ICD-10-CM | POA: Diagnosis not present

## 2019-11-27 DIAGNOSIS — E039 Hypothyroidism, unspecified: Secondary | ICD-10-CM | POA: Diagnosis not present

## 2019-11-27 DIAGNOSIS — R52 Pain, unspecified: Secondary | ICD-10-CM | POA: Diagnosis not present

## 2019-11-30 ENCOUNTER — Other Ambulatory Visit: Payer: Self-pay | Admitting: Family Medicine

## 2019-11-30 DIAGNOSIS — R2689 Other abnormalities of gait and mobility: Secondary | ICD-10-CM | POA: Diagnosis not present

## 2019-11-30 DIAGNOSIS — R52 Pain, unspecified: Secondary | ICD-10-CM | POA: Diagnosis not present

## 2019-11-30 DIAGNOSIS — N2581 Secondary hyperparathyroidism of renal origin: Secondary | ICD-10-CM | POA: Diagnosis not present

## 2019-11-30 DIAGNOSIS — N186 End stage renal disease: Secondary | ICD-10-CM | POA: Diagnosis not present

## 2019-11-30 DIAGNOSIS — U071 COVID-19: Secondary | ICD-10-CM | POA: Diagnosis not present

## 2019-11-30 DIAGNOSIS — E039 Hypothyroidism, unspecified: Secondary | ICD-10-CM | POA: Diagnosis not present

## 2019-11-30 DIAGNOSIS — E1122 Type 2 diabetes mellitus with diabetic chronic kidney disease: Secondary | ICD-10-CM | POA: Diagnosis not present

## 2019-11-30 DIAGNOSIS — Z992 Dependence on renal dialysis: Secondary | ICD-10-CM | POA: Diagnosis not present

## 2019-11-30 NOTE — Telephone Encounter (Signed)
I called the patient to see how her BP is doing and to see if she is having any issues with doing PT and received no answer or VM.  Kendra Woolford,cma

## 2019-12-01 ENCOUNTER — Other Ambulatory Visit: Payer: Medicare Other | Admitting: Adult Health Nurse Practitioner

## 2019-12-01 ENCOUNTER — Ambulatory Visit: Payer: Medicare Other | Admitting: Cardiovascular Disease

## 2019-12-01 ENCOUNTER — Other Ambulatory Visit: Payer: Self-pay

## 2019-12-01 DIAGNOSIS — Z515 Encounter for palliative care: Secondary | ICD-10-CM | POA: Diagnosis not present

## 2019-12-01 DIAGNOSIS — N186 End stage renal disease: Secondary | ICD-10-CM

## 2019-12-01 NOTE — Progress Notes (Signed)
[place] Consult Note Telephone: [phone]  Fax: [phone]  PATIENT NAME: Jamie Arias DOB: 11/26/1955 MRN: [id number]  PRIMARY CARE PROVIDER:   [name], MD  REFERRING PROVIDER:  [name], MD [address] [place],  [place] [address]  RESPONSIBLE PARTY:   Self [phone] [name], husband [phone]    RECOMMENDATIONS and PLAN:  1.  Advanced care planning.  Patient is documented as DNR in Epic but upon discussion today she and her husband have not had that discussion and are unsure what to choose.  Discussed ACP and left MOST form for them to go over and will discuss further at a future appointment.  2.  Functional status. States that she has lost ability to walk after having Covid in June of 2020. Patient is wheelchair and bed bound.  She offers some assistance with transfers and uses a board.  She requires assistance with ADLs.  Incontinent of B&B.  Is able to feed herself.  She works with PT through [employer].  Continue PT as ordered.  3.  Nutritional status.  Patient states appetite is fair.  She eats 2 meals a day.  Weight has been stable in the 160s.    4.  Support.  Husband is her primary caregiver.  Denied having any difficulty getting food or meds.   Husband did ask about rent assistance.  Gave him contact info for [employer] to see if they had any way to provide them assistance.  Palliative care will continue to monitor for symptom management/decline and make recommendations as needed.  Next appointment in in 4 weeks.  Encouraged to call with any questions or concerns  I spent 60 minutes providing this consultation,  from 1:30 to 2:30 including time spent with patient/family, chart review, provider coordination, documentation. More than 50% of the time in this consultation was spent coordinating communication.   HISTORY OF PRESENT ILLNESS:  Jamie Arias is a 65 y.o. year  old female with multiple medical problems including ESRD on HD (M,W,F), HTN, HLD, DMT2,a-fib, CAD, dCHF, iron deficiency anemia. Palliative Care was asked to help address goals of care.Her dialysis was changed from 3 days a week to 2 days a week recently due to persistent nausea.  States that she feels better with the change and has no complaints of nausea today. Patient was in hospital 4/26-4/27/21 for nausea and vomiting and again on 7/7-09/10/19 for hyperkalemia and complications of dialysis port.  She was hospitalized 8/5-8/12/21 for abdominal pain and hypoxia and was found to have thrush in her esophagus.  Denies headaches, dizziness, fever, pain, SOB, cough, N/V/D, constipation, dysuria, hematuria.  Does have tremor noted to left arm today.  She states that she will get tremors at different place on her body and at different times.  She does have appointment with neurology in [place] to evaluate.  CODE STATUS: see above  PPS: 30% HOSPICE ELIGIBILITY/DIAGNOSIS: TBD  PHYSICAL EXAM:  BP 112/50  HR 61  O2 91% on RA General: NAD, frail appearing Cardiovascular: regular rate and rhythm; fistula to left upper arm with bruit heard and thrill felt Pulmonary: clear ant fields; normal respiratory effort Abdomen: soft, nontender, + bowel sounds GU: no suprapubic tenderness Extremities: no edema, no joint deformities Skin: no rashes on exposed skin Neurological: Weakness but otherwise nonfocal;  Does have noted tremor to left arm today   PAST MEDICAL HISTORY:  Past Medical History:  Diagnosis Date  Anemia    Arm DVT (deep venous thromboembolism), acute, left ([facility]) 02/13/2019   Arthritis    upper back and neck    Chronic diastolic CHF (congestive heart failure) ([facility])    a. Due to ischemic cardiomyopathy. EF as low as 35%, improved to normal s/p CABG; b. echo 07/06/13: EF 55-60%, no RWMAs, mod TR, trivial pericardial effusion not c/w tamponade physiology;  c. 10/2015 Echo: EF 65%, Gr1 DD, triv  AI, mild MR, mildly dil LA, mod TR, PASP 72mmHg.   Coronary artery disease    a. NSTEMI 06/2013; b.cath: severe three-vessel CAD w/ EF 30% & mild-mod MR; c. s/p 3 vessel CABG 07/02/13 (LIMA-LAD, SVG-OM, and SVG-RPDA);  d. 10/2015 MV: no ischemia/infarct.   Diabetes mellitus without complication ([facility])    Diabetic neuropathy ([place])    ESRD (end stage renal disease) ([facility])    a. 12/2015 initiated - mwf dialysis.   GERD (gastroesophageal reflux disease)    Hyperlipidemia    Hypertension    Hypothyroidism    Myocardial infarction [facility]) 2014   Pleural effusion 2015   Pulmonary hypertension ([facility])    Wears dentures    full lower    SOCIAL HX:  Social History   Tobacco Use   Smoking status: Never Smoker   Smokeless tobacco: Never Used  Substance Use Topics   Alcohol use: No    Alcohol/week: 0.0 standard drinks    ALLERGIES:  Allergies  Allergen Reactions   Nsaids Other (See Comments)    Contraindicated due to kidney disease.   5-Alpha Reductase Inhibitors    Doxycycline Other (See Comments)    tremor     PERTINENT MEDICATIONS:  Outpatient Encounter Medications as of 12/01/2019  Medication Sig   acetaminophen (TYLENOL) 325 MG tablet Take 650 mg by mouth daily as needed for moderate pain or headache.    atorvastatin (LIPITOR) 40 MG tablet Take 1 tablet (40 mg total) by mouth daily.   FLUoxetine (PROZAC) 20 MG capsule Take 1 capsule (20 mg total) by mouth daily.   fluticasone (FLONASE) 50 MCG/ACT nasal spray Place 2 sprays into both nostrils daily. (Patient taking differently: Place 2 sprays into both nostrils daily as needed for allergies or rhinitis. )   folic acid (FOLVITE) 1 MG tablet Take 1 tablet (1 mg total) by mouth daily.   gabapentin (NEURONTIN) 100 MG capsule Take 100 mg by mouth 3 (three) times daily.   hydrALAZINE (APRESOLINE) 10 MG tablet Take 1 tablet (10 mg total) by mouth in the morning and at bedtime.   hydrocortisone cream 1 % Apply topically  as needed for itching.   levothyroxine (SYNTHROID) 88 MCG tablet Take 1 tablet (88 mcg total) by mouth daily before breakfast.   lidocaine-prilocaine (EMLA) cream Apply 1 application topically as needed.   Melatonin 3 MG TABS Take 3 mg by mouth at bedtime as needed (sleep).    multivitamin (RENA-VIT) TABS tablet Take 1 tablet by mouth at bedtime.   nitroGLYCERIN (NITROSTAT) 0.4 MG SL tablet DISSOLVE ONE TABLET UNDER THE TONGUE EVERY 5 MINUTES AS NEEDED FOR CHEST PAIN.  DO NOT EXCEED A TOTAL OF 3 DOSES IN 15 MINUTES (Patient taking differently: Place 0.4 mg under the tongue every 5 (five) minutes as needed for chest pain. DISSOLVE ONE TABLET UNDER THE TONGUE EVERY 5 MINUTES AS NEEDED FOR CHEST PAIN.  DO NOT EXCEED A TOTAL OF 3 DOSES IN 15 MINUTES)   Nutritional Supplements (FEEDING SUPPLEMENT, NEPRO CARB STEADY,) LIQD Take 237 mLs by mouth 2 (  two) times daily between meals.   omeprazole (PRILOSEC) 20 MG capsule Take 20 mg by mouth daily.   ondansetron (ZOFRAN-ODT) 4 MG disintegrating tablet Take 1 tablet (4 mg total) by mouth every 8 (eight) hours as needed for nausea or vomiting. Patient needs appointment with PCP to receive further refills.   PACERONE 200 MG tablet TAKE 1 TABLET BY MOUTH ONCE DAILY (NEEDS  APPOINTMENT  WITH  PCP  FOR  FURTHER  REFILLS)   traZODone (DESYREL) 50 MG tablet TAKE 1 TABLET BY MOUTH AT BEDTIME . APPOINTMENT REQUIRED FOR FUTURE REFILLS   vitamin C (VITAMIN C) 500 MG tablet Take 1 tablet (500 mg total) by mouth daily.   No facility-administered encounter medications on file as of 12/01/2019.     Brandyn Thien Jenetta Downer, NP

## 2019-12-01 NOTE — Telephone Encounter (Signed)
He stated that her BP has been doing good he did not have any numbers but the nurse has been keeping an eye on it and it seems to be much better.  Wilfred Dayrit,cma

## 2019-12-01 NOTE — Telephone Encounter (Signed)
I called and spoke with the patient's husband and he stated the patient is doing PT at this time, she did miss a few appointments because she did not feel well but she is back doing PT again.  Maximino Cozzolino,cma

## 2019-12-01 NOTE — Telephone Encounter (Signed)
What has her BP been?

## 2019-12-02 DIAGNOSIS — E039 Hypothyroidism, unspecified: Secondary | ICD-10-CM | POA: Diagnosis not present

## 2019-12-02 DIAGNOSIS — B372 Candidiasis of skin and nail: Secondary | ICD-10-CM | POA: Diagnosis not present

## 2019-12-02 DIAGNOSIS — Z951 Presence of aortocoronary bypass graft: Secondary | ICD-10-CM | POA: Diagnosis not present

## 2019-12-02 DIAGNOSIS — E1140 Type 2 diabetes mellitus with diabetic neuropathy, unspecified: Secondary | ICD-10-CM | POA: Diagnosis not present

## 2019-12-02 DIAGNOSIS — I132 Hypertensive heart and chronic kidney disease with heart failure and with stage 5 chronic kidney disease, or end stage renal disease: Secondary | ICD-10-CM | POA: Diagnosis not present

## 2019-12-02 DIAGNOSIS — E785 Hyperlipidemia, unspecified: Secondary | ICD-10-CM | POA: Diagnosis not present

## 2019-12-02 DIAGNOSIS — I82622 Acute embolism and thrombosis of deep veins of left upper extremity: Secondary | ICD-10-CM | POA: Diagnosis not present

## 2019-12-02 DIAGNOSIS — I2510 Atherosclerotic heart disease of native coronary artery without angina pectoris: Secondary | ICD-10-CM | POA: Diagnosis not present

## 2019-12-02 DIAGNOSIS — R1312 Dysphagia, oropharyngeal phase: Secondary | ICD-10-CM | POA: Diagnosis not present

## 2019-12-02 DIAGNOSIS — E1122 Type 2 diabetes mellitus with diabetic chronic kidney disease: Secondary | ICD-10-CM | POA: Diagnosis not present

## 2019-12-02 DIAGNOSIS — D631 Anemia in chronic kidney disease: Secondary | ICD-10-CM | POA: Diagnosis not present

## 2019-12-02 DIAGNOSIS — Z992 Dependence on renal dialysis: Secondary | ICD-10-CM | POA: Diagnosis not present

## 2019-12-02 DIAGNOSIS — I5032 Chronic diastolic (congestive) heart failure: Secondary | ICD-10-CM | POA: Diagnosis not present

## 2019-12-02 DIAGNOSIS — N186 End stage renal disease: Secondary | ICD-10-CM | POA: Diagnosis not present

## 2019-12-02 DIAGNOSIS — Z7901 Long term (current) use of anticoagulants: Secondary | ICD-10-CM | POA: Diagnosis not present

## 2019-12-02 DIAGNOSIS — I4891 Unspecified atrial fibrillation: Secondary | ICD-10-CM | POA: Diagnosis not present

## 2019-12-02 NOTE — Telephone Encounter (Signed)
Noted  

## 2019-12-03 DIAGNOSIS — Z992 Dependence on renal dialysis: Secondary | ICD-10-CM | POA: Diagnosis not present

## 2019-12-03 DIAGNOSIS — D631 Anemia in chronic kidney disease: Secondary | ICD-10-CM | POA: Diagnosis not present

## 2019-12-03 DIAGNOSIS — I82622 Acute embolism and thrombosis of deep veins of left upper extremity: Secondary | ICD-10-CM | POA: Diagnosis not present

## 2019-12-03 DIAGNOSIS — R1312 Dysphagia, oropharyngeal phase: Secondary | ICD-10-CM | POA: Diagnosis not present

## 2019-12-03 DIAGNOSIS — I2510 Atherosclerotic heart disease of native coronary artery without angina pectoris: Secondary | ICD-10-CM | POA: Diagnosis not present

## 2019-12-03 DIAGNOSIS — E1140 Type 2 diabetes mellitus with diabetic neuropathy, unspecified: Secondary | ICD-10-CM | POA: Diagnosis not present

## 2019-12-03 DIAGNOSIS — I5032 Chronic diastolic (congestive) heart failure: Secondary | ICD-10-CM | POA: Diagnosis not present

## 2019-12-03 DIAGNOSIS — B372 Candidiasis of skin and nail: Secondary | ICD-10-CM | POA: Diagnosis not present

## 2019-12-03 DIAGNOSIS — E039 Hypothyroidism, unspecified: Secondary | ICD-10-CM | POA: Diagnosis not present

## 2019-12-03 DIAGNOSIS — Z951 Presence of aortocoronary bypass graft: Secondary | ICD-10-CM | POA: Diagnosis not present

## 2019-12-03 DIAGNOSIS — N186 End stage renal disease: Secondary | ICD-10-CM | POA: Diagnosis not present

## 2019-12-03 DIAGNOSIS — E785 Hyperlipidemia, unspecified: Secondary | ICD-10-CM | POA: Diagnosis not present

## 2019-12-03 DIAGNOSIS — Z7901 Long term (current) use of anticoagulants: Secondary | ICD-10-CM | POA: Diagnosis not present

## 2019-12-03 DIAGNOSIS — I4891 Unspecified atrial fibrillation: Secondary | ICD-10-CM | POA: Diagnosis not present

## 2019-12-03 DIAGNOSIS — I132 Hypertensive heart and chronic kidney disease with heart failure and with stage 5 chronic kidney disease, or end stage renal disease: Secondary | ICD-10-CM | POA: Diagnosis not present

## 2019-12-03 DIAGNOSIS — E1122 Type 2 diabetes mellitus with diabetic chronic kidney disease: Secondary | ICD-10-CM | POA: Diagnosis not present

## 2019-12-04 DIAGNOSIS — N2581 Secondary hyperparathyroidism of renal origin: Secondary | ICD-10-CM | POA: Diagnosis not present

## 2019-12-04 DIAGNOSIS — Z992 Dependence on renal dialysis: Secondary | ICD-10-CM | POA: Diagnosis not present

## 2019-12-04 DIAGNOSIS — N186 End stage renal disease: Secondary | ICD-10-CM | POA: Diagnosis not present

## 2019-12-04 DIAGNOSIS — E039 Hypothyroidism, unspecified: Secondary | ICD-10-CM | POA: Diagnosis not present

## 2019-12-04 NOTE — Telephone Encounter (Signed)
Patient had a visit with provider.  Stacyann Mcconaughy,cma

## 2019-12-04 NOTE — Telephone Encounter (Signed)
Error.  Monalisa Bayless,cma  

## 2019-12-07 DIAGNOSIS — E039 Hypothyroidism, unspecified: Secondary | ICD-10-CM | POA: Diagnosis not present

## 2019-12-07 DIAGNOSIS — Z992 Dependence on renal dialysis: Secondary | ICD-10-CM | POA: Diagnosis not present

## 2019-12-07 DIAGNOSIS — N2581 Secondary hyperparathyroidism of renal origin: Secondary | ICD-10-CM | POA: Diagnosis not present

## 2019-12-07 DIAGNOSIS — N186 End stage renal disease: Secondary | ICD-10-CM | POA: Diagnosis not present

## 2019-12-07 NOTE — Progress Notes (Deleted)
Assessment/Plan:   ***  Subjective:   Jamie Arias was seen today in the movement disorders clinic for neurologic consultation at the request of [name], MD.  The consultation is for the evaluation of tremor.  Medical records made available to me have been reviewed.  She saw Dr. Manuella Ghazi at [facility] neurology in December, 2018 for balance change (felt to be due to diabetic peripheral neuropathy and cerebral extensive small vessel disease) as well as tremor bilaterally in the upper extremities.  She was only seen for a one-time visit.  Since that time, she has developed a jaw tremor that started about 6 months ago.  Patient is on dialysis due to her diabetes/diabetic nephropathy.  Tremor: {yes no:314532}   How long has it been going on? ***  At rest or with activation?  ***  When is it noted the most?  ***  Fam hx of tremor?  {yes EY:814481}  Located where?  ***  Affected by caffeine:  {yes no:314532}  Affected by alcohol:  {yes no:314532}  Affected by stress:  {yes no:314532}  Affected by fatigue:  {yes no:314532}  Spills soup if on spoon:  {yes no:314532}  Spills glass of liquid if full:  {yes no:314532}  Affects ADL's (tying shoes, brushing teeth, etc):  {yes no:314532}  Tremor inducing meds:  {yes no:314532}  Other Specific Symptoms:  Voice: *** Sleep: ***  Vivid Dreams:  {yes no:314532}  Acting out dreams:  {yes no:314532} Wet Pillows: {yes no:314532} Postural symptoms:  {yes no:314532}  Falls?  {yes no:314532} Bradykinesia symptoms: {parkinson brady:18041} Loss of smell:  {yes no:314532} Loss of taste:  {yes no:314532} Urinary Incontinence:  {yes no:314532} Difficulty Swallowing:  {yes no:314532} Handwriting, micrographia: {yes no:314532} Trouble with ADL's:  {yes no:314532}  Trouble buttoning clothing: {yes no:314532} Depression:  {yes no:314532} Memory changes:  {yes no:314532} Hallucinations:  {yes no:314532}  visual distortions: {yes no:314532} N/V:   {yes no:314532} Lightheaded:  {yes no:314532}  Syncope: {yes no:314532} Diplopia:  {yes no:314532} Dyskinesia:  {yes no:314532}   Patient had MRI of the brain on October 12, 2019.  I reviewed this.  There was moderate small vessel changes.  PREVIOUS MEDICATIONS: {Parkinson's RX:18200}  ALLERGIES:   Allergies  Allergen Reactions  . Nsaids Other (See Comments)    Contraindicated due to kidney disease.  Judeth Cornfield Reductase Inhibitors   . Doxycycline Other (See Comments)    tremor    CURRENT MEDICATIONS:  Current Outpatient Medications  Medication Instructions  . acetaminophen (TYLENOL) 650 mg, Oral, Daily PRN  . ascorbic acid (VITAMIN C) 500 mg, Oral, Daily  . atorvastatin (LIPITOR) 40 mg, Oral, Daily  . FLUoxetine (PROZAC) 20 mg, Oral, Daily  . fluticasone (FLONASE) 50 MCG/ACT nasal spray 2 sprays, Each Nare, Daily  . folic acid (FOLVITE) 1 mg, Oral, Daily  . gabapentin (NEURONTIN) 100 mg, Oral, 3 times daily  . hydrALAZINE (APRESOLINE) 10 mg, Oral, 2 times daily  . hydrocortisone cream 1 % Topical, As needed  . levothyroxine (SYNTHROID) 88 mcg, Oral, Daily before breakfast  . lidocaine-prilocaine (EMLA) cream 1 application, Topical, As needed  . melatonin 3 mg, Oral, At bedtime PRN  . multivitamin (RENA-VIT) TABS tablet 1 tablet, Oral, Daily at bedtime  . nitroGLYCERIN (NITROSTAT) 0.4 MG SL tablet DISSOLVE ONE TABLET UNDER THE TONGUE EVERY 5 MINUTES AS NEEDED FOR CHEST PAIN.  DO NOT EXCEED A TOTAL OF 3 DOSES IN 15 MINUTES  . Nutritional Supplements (FEEDING SUPPLEMENT, NEPRO CARB STEADY,) LIQD 237 mLs, Oral,  2 times daily between meals  . omeprazole (PRILOSEC) 20 mg, Oral, Daily  . ondansetron (ZOFRAN-ODT) 4 mg, Oral, Every 8 hours PRN, Patient needs appointment with PCP to receive further refills.  Marland Kitchen PACERONE 200 MG tablet TAKE 1 TABLET BY MOUTH ONCE DAILY (NEEDS  APPOINTMENT  WITH  PCP  FOR  FURTHER  REFILLS)  . traZODone (DESYREL) 50 MG tablet TAKE 1 TABLET BY MOUTH AT  BEDTIME . APPOINTMENT REQUIRED FOR FUTURE REFILLS    Objective:   PHYSICAL EXAMINATION:    VITALS:  There were no vitals filed for this visit.  GEN:  The patient appears stated age and is in NAD. HEENT:  Normocephalic, atraumatic.  The mucous membranes are moist. The superficial temporal arteries are without ropiness or tenderness. CV:  RRR Lungs:  CTAB Neck/HEME:  There are no carotid bruits bilaterally.  Neurological examination:  Orientation: The patient is alert and oriented x3.  Cranial nerves: There is good facial symmetry.  Extraocular muscles are intact. The visual fields are full to confrontational testing. The speech is fluent and clear. Soft palate rises symmetrically and there is no tongue deviation. Hearing is intact to conversational tone. Sensation: Sensation is intact to light touch throughout (facial, trunk, extremities). Vibration is intact at the bilateral big toe. There is no extinction with double simultaneous stimulation.  Motor: Strength is 5/5 in the bilateral upper and lower extremities.   Shoulder shrug is equal and symmetric.  There is no pronator drift. Deep tendon reflexes: Deep tendon reflexes are 2/4 at the bilateral biceps, triceps, brachioradialis, patella and achilles. Plantar responses are downgoing bilaterally.  Movement examination: Tone: There is ***tone in the bilateral upper extremities.  The tone in the lower extremities is ***.  Abnormal movements: *** Coordination:  There is *** decremation with RAM's, *** Gait and Station: The patient has *** difficulty arising out of a deep-seated chair without the use of the hands. The patient's stride length is ***.  The patient has a *** pull test.     I have reviewed and interpreted the following labs independently   Chemistry      Component Value Date/Time   NA 136 10/14/2019 0544   NA 138 06/23/2019 1600   NA 140 03/30/2014 1611   K 3.6 10/14/2019 0544   K 4.3 03/30/2014 1611   CL 97 (L)  10/14/2019 0544   CL 105 03/30/2014 1611   CO2 31 10/14/2019 0544   CO2 29 03/30/2014 1611   BUN 20 10/14/2019 0544   BUN 21 06/23/2019 1600   BUN 21 (H) 03/30/2014 1611   CREATININE 4.32 (H) 10/14/2019 0544   CREATININE 3.14 (H) 09/09/2015 1534   GLU 187 03/30/2014 0000      Component Value Date/Time   CALCIUM 7.9 (L) 10/14/2019 0544   CALCIUM 9.0 03/30/2014 1611   ALKPHOS 41 10/12/2019 0410   ALKPHOS 145 (H) 07/23/2013 0741   AST 55 (H) 10/12/2019 0410   AST 16 07/23/2013 0741   ALT 14 10/12/2019 0410   ALT 15 07/23/2013 0741   BILITOT 1.1 10/12/2019 0410   BILITOT 0.3 02/11/2018 1404   BILITOT 0.7 07/23/2013 0741      Lab Results  Component Value Date   TSH 17.828 (H) 03/15/2019   Lab Results  Component Value Date   WBC 5.6 10/15/2019   HGB 8.8 (L) 10/15/2019   HCT 25.1 (L) 10/15/2019   MCV 88.7 10/15/2019   PLT 154 10/15/2019      Total time spent  on today's visit was ***greater than 60 minutes, including both face-to-face time and nonface-to-face time.  Time included that spent on review of records (prior notes available to me/labs/imaging if pertinent), discussing treatment and goals, answering patient's questions and coordinating care.  Cc:  [name], MD

## 2019-12-08 DIAGNOSIS — E1140 Type 2 diabetes mellitus with diabetic neuropathy, unspecified: Secondary | ICD-10-CM | POA: Diagnosis not present

## 2019-12-08 DIAGNOSIS — E785 Hyperlipidemia, unspecified: Secondary | ICD-10-CM | POA: Diagnosis not present

## 2019-12-08 DIAGNOSIS — B372 Candidiasis of skin and nail: Secondary | ICD-10-CM | POA: Diagnosis not present

## 2019-12-08 DIAGNOSIS — I5032 Chronic diastolic (congestive) heart failure: Secondary | ICD-10-CM | POA: Diagnosis not present

## 2019-12-08 DIAGNOSIS — I132 Hypertensive heart and chronic kidney disease with heart failure and with stage 5 chronic kidney disease, or end stage renal disease: Secondary | ICD-10-CM | POA: Diagnosis not present

## 2019-12-08 DIAGNOSIS — I2510 Atherosclerotic heart disease of native coronary artery without angina pectoris: Secondary | ICD-10-CM | POA: Diagnosis not present

## 2019-12-08 DIAGNOSIS — I82622 Acute embolism and thrombosis of deep veins of left upper extremity: Secondary | ICD-10-CM | POA: Diagnosis not present

## 2019-12-08 DIAGNOSIS — E1122 Type 2 diabetes mellitus with diabetic chronic kidney disease: Secondary | ICD-10-CM | POA: Diagnosis not present

## 2019-12-08 DIAGNOSIS — I4891 Unspecified atrial fibrillation: Secondary | ICD-10-CM | POA: Diagnosis not present

## 2019-12-08 DIAGNOSIS — R1312 Dysphagia, oropharyngeal phase: Secondary | ICD-10-CM | POA: Diagnosis not present

## 2019-12-08 DIAGNOSIS — N186 End stage renal disease: Secondary | ICD-10-CM | POA: Diagnosis not present

## 2019-12-08 DIAGNOSIS — Z992 Dependence on renal dialysis: Secondary | ICD-10-CM | POA: Diagnosis not present

## 2019-12-08 DIAGNOSIS — D631 Anemia in chronic kidney disease: Secondary | ICD-10-CM | POA: Diagnosis not present

## 2019-12-08 DIAGNOSIS — Z7901 Long term (current) use of anticoagulants: Secondary | ICD-10-CM | POA: Diagnosis not present

## 2019-12-08 DIAGNOSIS — Z951 Presence of aortocoronary bypass graft: Secondary | ICD-10-CM | POA: Diagnosis not present

## 2019-12-08 DIAGNOSIS — E039 Hypothyroidism, unspecified: Secondary | ICD-10-CM | POA: Diagnosis not present

## 2019-12-09 ENCOUNTER — Ambulatory Visit (INDEPENDENT_AMBULATORY_CARE_PROVIDER_SITE_OTHER): Payer: Medicare Other | Admitting: Nurse Practitioner

## 2019-12-09 ENCOUNTER — Encounter (INDEPENDENT_AMBULATORY_CARE_PROVIDER_SITE_OTHER): Payer: Medicare Other

## 2019-12-10 ENCOUNTER — Ambulatory Visit: Payer: Medicare Other | Admitting: Neurology

## 2019-12-15 ENCOUNTER — Other Ambulatory Visit: Payer: Self-pay | Admitting: Family Medicine

## 2019-12-15 DIAGNOSIS — Z992 Dependence on renal dialysis: Secondary | ICD-10-CM | POA: Diagnosis not present

## 2019-12-15 DIAGNOSIS — I4891 Unspecified atrial fibrillation: Secondary | ICD-10-CM | POA: Diagnosis not present

## 2019-12-15 DIAGNOSIS — E039 Hypothyroidism, unspecified: Secondary | ICD-10-CM | POA: Diagnosis not present

## 2019-12-15 DIAGNOSIS — E1140 Type 2 diabetes mellitus with diabetic neuropathy, unspecified: Secondary | ICD-10-CM | POA: Diagnosis not present

## 2019-12-15 DIAGNOSIS — I5032 Chronic diastolic (congestive) heart failure: Secondary | ICD-10-CM | POA: Diagnosis not present

## 2019-12-15 DIAGNOSIS — N186 End stage renal disease: Secondary | ICD-10-CM | POA: Diagnosis not present

## 2019-12-15 DIAGNOSIS — I132 Hypertensive heart and chronic kidney disease with heart failure and with stage 5 chronic kidney disease, or end stage renal disease: Secondary | ICD-10-CM | POA: Diagnosis not present

## 2019-12-15 DIAGNOSIS — E1122 Type 2 diabetes mellitus with diabetic chronic kidney disease: Secondary | ICD-10-CM | POA: Diagnosis not present

## 2019-12-15 DIAGNOSIS — Z7901 Long term (current) use of anticoagulants: Secondary | ICD-10-CM | POA: Diagnosis not present

## 2019-12-15 DIAGNOSIS — B372 Candidiasis of skin and nail: Secondary | ICD-10-CM | POA: Diagnosis not present

## 2019-12-15 DIAGNOSIS — I82622 Acute embolism and thrombosis of deep veins of left upper extremity: Secondary | ICD-10-CM | POA: Diagnosis not present

## 2019-12-15 DIAGNOSIS — I2510 Atherosclerotic heart disease of native coronary artery without angina pectoris: Secondary | ICD-10-CM | POA: Diagnosis not present

## 2019-12-15 DIAGNOSIS — D631 Anemia in chronic kidney disease: Secondary | ICD-10-CM | POA: Diagnosis not present

## 2019-12-15 DIAGNOSIS — Z951 Presence of aortocoronary bypass graft: Secondary | ICD-10-CM | POA: Diagnosis not present

## 2019-12-15 DIAGNOSIS — R1312 Dysphagia, oropharyngeal phase: Secondary | ICD-10-CM | POA: Diagnosis not present

## 2019-12-15 DIAGNOSIS — E785 Hyperlipidemia, unspecified: Secondary | ICD-10-CM | POA: Diagnosis not present

## 2019-12-17 ENCOUNTER — Other Ambulatory Visit: Payer: Self-pay

## 2019-12-17 ENCOUNTER — Encounter (INDEPENDENT_AMBULATORY_CARE_PROVIDER_SITE_OTHER): Payer: Self-pay | Admitting: Nurse Practitioner

## 2019-12-17 ENCOUNTER — Ambulatory Visit (INDEPENDENT_AMBULATORY_CARE_PROVIDER_SITE_OTHER): Payer: Medicare Other | Admitting: Nurse Practitioner

## 2019-12-17 ENCOUNTER — Ambulatory Visit (INDEPENDENT_AMBULATORY_CARE_PROVIDER_SITE_OTHER): Payer: Medicare Other

## 2019-12-17 VITALS — BP 195/70 | HR 61 | Ht 64.0 in | Wt 165.0 lb

## 2019-12-17 DIAGNOSIS — N2581 Secondary hyperparathyroidism of renal origin: Secondary | ICD-10-CM | POA: Diagnosis not present

## 2019-12-17 DIAGNOSIS — N186 End stage renal disease: Secondary | ICD-10-CM

## 2019-12-17 DIAGNOSIS — E039 Hypothyroidism, unspecified: Secondary | ICD-10-CM | POA: Diagnosis not present

## 2019-12-17 DIAGNOSIS — I10 Essential (primary) hypertension: Secondary | ICD-10-CM

## 2019-12-17 DIAGNOSIS — Z992 Dependence on renal dialysis: Secondary | ICD-10-CM | POA: Diagnosis not present

## 2019-12-17 DIAGNOSIS — E785 Hyperlipidemia, unspecified: Secondary | ICD-10-CM

## 2019-12-18 ENCOUNTER — Telehealth (INDEPENDENT_AMBULATORY_CARE_PROVIDER_SITE_OTHER): Payer: Self-pay

## 2019-12-18 NOTE — Telephone Encounter (Signed)
I attempted to contact the patient and a message was left for a return call. 

## 2019-12-18 NOTE — Telephone Encounter (Signed)
Spoke with the patient's spouse and she is scheduled with Dr. Delana Meyer for a left arm shuntogram on 12/29/19 with a 9:00 am arrival time to the [facility]. Covid testing on 12/25/19 between 8-1 pm at the [facility]. Pre-procedure instructions were discussed and will be mailed.

## 2019-12-21 DIAGNOSIS — E039 Hypothyroidism, unspecified: Secondary | ICD-10-CM | POA: Diagnosis not present

## 2019-12-21 DIAGNOSIS — N186 End stage renal disease: Secondary | ICD-10-CM | POA: Diagnosis not present

## 2019-12-21 DIAGNOSIS — N2581 Secondary hyperparathyroidism of renal origin: Secondary | ICD-10-CM | POA: Diagnosis not present

## 2019-12-21 DIAGNOSIS — Z992 Dependence on renal dialysis: Secondary | ICD-10-CM | POA: Diagnosis not present

## 2019-12-22 ENCOUNTER — Encounter (INDEPENDENT_AMBULATORY_CARE_PROVIDER_SITE_OTHER): Payer: Self-pay | Admitting: Nurse Practitioner

## 2019-12-22 NOTE — Progress Notes (Signed)
Subjective:    [name], female    DOB: 1955/09/21, 64 y.o.   MRN: [id number] Chief Complaint  Patient presents with  . Follow-up    est.kolluru. prolonged bleeding     The patient returns to the office for follow up regarding problem with the dialysis access.  Left brachial axillary graft.  The patient has had multiple failed upper extremity accesses.  The patient notes a significant increase in bleeding time after decannulation.  The patient has also been informed that there is increased recirculation.    The patient endorses slight numbness.  No significant arm swelling.  The patient denies redness or swelling at the access site. The patient denies fever or chills at home or while on dialysis.  The patient denies amaurosis fugax or recent TIA symptoms. There are no recent neurological changes noted. The patient denies claudication symptoms or rest pain symptoms. The patient denies history of DVT, PE or superficial thrombophlebitis. The patient denies recent episodes of angina or shortness of breath.   Today the patient has a flow volume of 752.  But it is noted that the venous outflow has a hemodynamically significant focal stenosis in the outflow vein adjacent to the distal anastomosis which appears to be due to decreased vessel size.  The arterial flow distal to the proximal anastomosis is consistent with a possible partial access steal.  This duplex was somewhat limited as the patient dialyzed prior to her appointment today.   Review of Systems  Neurological: Positive for weakness.  Hematological: Bruises/bleeds easily.  All other systems reviewed and are negative.      Objective:   Physical Exam Vitals reviewed.  HENT:     Head: Normocephalic.  Cardiovascular:     Rate and Rhythm: Normal rate.     Pulses: Normal pulses.     Arteriovenous access: left arteriovenous access is present.    Comments: Left dialysis access with bruising  Pulmonary:      Effort: Pulmonary effort is normal.  Neurological:     Mental Status: She is alert and oriented to person, place, and time.     Motor: Weakness present.     Gait: Gait abnormal.  Psychiatric:        Mood and Affect: Mood normal.        Behavior: Behavior normal.        Thought Content: Thought content normal.        Judgment: Judgment normal.     BP (!) 195/70   Pulse 61   Ht 5\' 4"  (1.626 m)   Wt 165 lb (74.8 kg)   BMI 28.32 kg/m   Past Medical History:  Diagnosis Date  . Anemia   . Arm DVT (deep venous thromboembolism), acute, left ([place]) 02/13/2019  . Arthritis    upper back and neck   . Chronic diastolic CHF (congestive heart failure) ([place])    a. Due to ischemic cardiomyopathy. EF as low as 35%, improved to normal s/p CABG; b. echo 07/06/13: EF 55-60%, no RWMAs, mod TR, trivial pericardial effusion not c/w tamponade physiology;  c. 10/2015 Echo: EF 65%, Gr1 DD, triv AI, mild MR, mildly dil LA, mod TR, PASP 33mmHg.  Marland Kitchen Coronary artery disease    a. NSTEMI 06/2013; b.cath: severe three-vessel CAD w/ EF 30% & mild-mod MR; c. s/p 3 vessel CABG 07/02/13 (LIMA-LAD, SVG-OM, and SVG-RPDA);  d. 10/2015 MV: no ischemia/infarct.  . Diabetes mellitus without complication ([place])   . Diabetic neuropathy ([place])   .  ESRD (end stage renal disease) ([place])    a. 12/2015 initiated - [facility] dialysis.  Marland Kitchen GERD (gastroesophageal reflux disease)   . Hyperlipidemia   . Hypertension   . Hypothyroidism   . Myocardial infarction ([place]) 2014  . Pleural effusion 2015  . Pulmonary hypertension ([place])   . Wears dentures    full lower    Social History   Socioeconomic History  . Marital status: Married    Spouse name: Not on file  . Number of children: Not on file  . Years of education: Not on file  . Highest education level: Not on file  Occupational History  . Occupation: works for [employer]  Tobacco Use  . Smoking status: Never Smoker  . Smokeless tobacco: Never Used  Vaping Use  . Vaping Use:  Never used  Substance and Sexual Activity  . Alcohol use: No    Alcohol/week: 0.0 standard drinks  . Drug use: No  . Sexual activity: Not on file  Other Topics Concern  . Not on file  Social History Narrative   Patient lives at home with her husband. Patient has 4 adult children.   Social Determinants of Health   Financial Resource Strain:   . Difficulty of Paying Living Expenses: Not on file  Food Insecurity:   . Worried About Charity fundraiser in the Last Year: Not on file  . Ran Out of Food in the Last Year: Not on file  Transportation Needs:   . Lack of Transportation (Medical): Not on file  . Lack of Transportation (Non-Medical): Not on file  Physical Activity:   . Days of Exercise per Week: Not on file  . Minutes of Exercise per Session: Not on file  Stress:   . Feeling of Stress : Not on file  Social Connections:   . Frequency of Communication with Friends and Family: Not on file  . Frequency of Social Gatherings with Friends and Family: Not on file  . Attends Religious Services: Not on file  . Active Member of Clubs or Organizations: Not on file  . Attends Archivist Meetings: Not on file  . Marital Status: Not on file  Intimate Partner Violence:   . Fear of Current or Ex-Partner: Not on file  . Emotionally Abused: Not on file  . Physically Abused: Not on file  . Sexually Abused: Not on file    Past Surgical History:  Procedure Laterality Date  . A/V FISTULAGRAM Right 12/17/2016   Procedure: A/V Fistulagram;  Surgeon: Algernon Huxley, MD;  Location: [facility];  Service: Cardiovascular;  Laterality: Right;  . A/V FISTULAGRAM Right 01/07/2017   Procedure: A/V Fistulagram;  Surgeon: Algernon Huxley, MD;  Location: [facility];  Service: Cardiovascular;  Laterality: Right;  . A/V FISTULAGRAM Right 12/03/2017   Procedure: A/V FISTULAGRAM;  Surgeon: Katha Cabal, MD;  Location: [facility];  Service: Cardiovascular;  Laterality:  Right;  . A/V FISTULAGRAM Right 02/23/2019   Procedure: A/V Fistulagram;  Surgeon: Algernon Huxley, MD;  Location: [facility];  Service: Cardiovascular;  Laterality: Right;  . A/V SHUNT INTERVENTION N/A 02/22/2017   Procedure: A/V SHUNT INTERVENTION;  Surgeon: Algernon Huxley, MD;  Location: [facility];  Service: Cardiovascular;  Laterality: N/A;  . AV FISTULA PLACEMENT Right 02/03/2016   Procedure: INSERTION OF ARTERIOVENOUS (AV) GORE-TEX GRAFT ARM ( BRACH / AXILLARY );  Surgeon: Katha Cabal, MD;  Location: [facility];  Service: Vascular;  Laterality: Right;  . AV FISTULA PLACEMENT Left 07/09/2019   Procedure: INSERTION OF ARTERIOVENOUS (AV) GORE-TEX GRAFT ARM ( BRACHIAL AXILLARY );  Surgeon: Algernon Huxley, MD;  Location: [facility];  Service: Vascular;  Laterality: Left;  . [place] REMOVAL Right 03/04/2019   Procedure: REMOVAL OF ARTERIOVENOUS GORETEX GRAFT ([place]);  Surgeon: Algernon Huxley, MD;  Location: [facility];  Service: Vascular;  Laterality: Right;  . CARDIAC CATHETERIZATION    . CATARACT EXTRACTION Bilateral   . CHOLECYSTECTOMY N/A 12/09/2014   Procedure: LAPAROSCOPIC CHOLECYSTECTOMY;  Surgeon: Marlyce [name], MD;  Location: [facility];  Service: General;  Laterality: N/A;  . CORONARY ARTERY BYPASS GRAFT N/A 07/02/2013   Procedure: CORONARY ARTERY BYPASS GRAFTING (CABG);  Surgeon: Ivin [name], MD;  Location: [facility];  Service: Open Heart Surgery;  Laterality: N/A;  CABG x three, using left internal mammary artery and right leg greater saphenous vein harvested endoscopically  . DIALYSIS/PERMA CATHETER REMOVAL N/A 10/13/2019   Procedure: DIALYSIS/PERMA CATHETER REMOVAL;  Surgeon: Algernon Huxley, MD;  Location: [facility];  Service: Cardiovascular;  Laterality: N/A;  . ESOPHAGOGASTRODUODENOSCOPY N/A 04/11/2019   Procedure: ESOPHAGOGASTRODUODENOSCOPY (EGD);  Surgeon: Jonathon Bellows, MD;  Location: [facility];  Service: Gastroenterology;  Laterality: N/A;  .  ESOPHAGOGASTRODUODENOSCOPY (EGD) WITH PROPOFOL N/A 11/24/2015   Procedure: ESOPHAGOGASTRODUODENOSCOPY (EGD) WITH PROPOFOL;  Surgeon: [name], MD;  Location: [facility];  Service: Endoscopy;  Laterality: N/A;  Diabetic - insulin  . ESOPHAGOGASTRODUODENOSCOPY (EGD) WITH PROPOFOL N/A 10/10/2019   Procedure: ESOPHAGOGASTRODUODENOSCOPY (EGD) WITH PROPOFOL;  Surgeon: Virgel Manifold, MD;  Location: [facility];  Service: Endoscopy;  Laterality: N/A;  . EYE SURGERY Bilateral    Cataract Extraction with IOL  . INTRAOPERATIVE TRANSESOPHAGEAL ECHOCARDIOGRAM N/A 07/02/2013   Procedure: INTRAOPERATIVE TRANSESOPHAGEAL ECHOCARDIOGRAM;  Surgeon: Ivin [name], MD;  Location: [facility];  Service: Open Heart Surgery;  Laterality: N/A;  . PEG PLACEMENT N/A 03/03/2019   Procedure: PERCUTANEOUS ENDOSCOPIC GASTROSTOMY (PEG) PLACEMENT;  Surgeon: Virgel Manifold, MD;  Location: [facility];  Service: Endoscopy;  Laterality: N/A;  . PERIPHERAL VASCULAR CATHETERIZATION Right 12/06/2015   Procedure: Dialysis/Perma Catheter Insertion;  Surgeon: Katha Cabal, MD;  Location: [facility];  Service: Cardiovascular;  Laterality: Right;  . PERIPHERAL VASCULAR THROMBECTOMY Right 09/28/2016   Procedure: Peripheral Vascular Thrombectomy;  Surgeon: Katha Cabal, MD;  Location: [facility];  Service: Cardiovascular;  Laterality: Right;  . PERIPHERAL VASCULAR THROMBECTOMY Right 05/20/2018   Procedure: PERIPHERAL VASCULAR THROMBECTOMY;  Surgeon: Katha Cabal, MD;  Location: [facility];  Service: Cardiovascular;  Laterality: Right;  . PORTA CATH REMOVAL N/A 06/01/2016   Procedure: Glori [name] Cath Removal;  Surgeon: Katha Cabal, MD;  Location: [facility];  Service: Cardiovascular;  Laterality: N/A;  . THORACENTESIS Left 2015    Family History  Problem Relation Age of Onset  . COPD Mother   . Cancer Mother        Lung  . Pulmonary embolism Father   .  Diabetes Father   . Diabetes Paternal Grandfather   . Heart disease Maternal Grandmother   . Colon cancer Neg Hx   . Colon polyps Neg Hx   . Esophageal cancer Neg Hx   . Pancreatic cancer Neg Hx   . Liver disease Neg Hx     Allergies  Allergen Reactions  . Nsaids Other (See Comments)    Contraindicated due to kidney disease.  Judeth Cornfield Reductase Inhibitors   .  Doxycycline Other (See Comments)    tremor       Assessment & Plan:   1. Primary hypertension Continue antihypertensive medications as already ordered, these medications have been reviewed and there are no changes at this time.   2. ESRD (end stage renal disease) ([place]) Recommend:  The patient is experiencing increasing problems with their dialysis access.  Patient should have a fistulagram with the intention for intervention.  The intention for intervention is to restore appropriate flow and prevent thrombosis and possible loss of the access.  As well as improve the quality of dialysis therapy.  The risks, benefits and alternative therapies were reviewed in detail with the patient.  All questions were answered.  The patient agrees to proceed with angio/intervention.      3. Hyperlipidemia, unspecified hyperlipidemia type Continue statin as ordered and reviewed, no changes at this time    Current Outpatient Medications on File Prior to Visit  Medication Sig Dispense Refill  . acetaminophen (TYLENOL) 325 MG tablet Take 650 mg by mouth daily as needed for moderate pain or headache.     Marland Kitchen atorvastatin (LIPITOR) 40 MG tablet Take 1 tablet (40 mg total) by mouth daily. 90 tablet 1  . FLUoxetine (PROZAC) 20 MG capsule Take 1 capsule (20 mg total) by mouth daily. 90 capsule 1  . fluticasone (FLONASE) 50 MCG/ACT nasal spray Place 2 sprays into both nostrils daily. (Patient taking differently: Place 2 sprays into both nostrils daily as needed for allergies or rhinitis. ) 16 g 6  . folic acid (FOLVITE) 1 MG tablet Take 1  tablet (1 mg total) by mouth daily.    Marland Kitchen gabapentin (NEURONTIN) 100 MG capsule Take 100 mg by mouth 3 (three) times daily.    . hydrALAZINE (APRESOLINE) 10 MG tablet Take 1 tablet (10 mg total) by mouth in the morning and at bedtime. 180 tablet 0  . hydrocortisone cream 1 % Apply topically as needed for itching. 30 g 0  . levothyroxine (SYNTHROID) 88 MCG tablet Take 1 tablet (88 mcg total) by mouth daily before breakfast. 90 tablet 1  . lidocaine-prilocaine (EMLA) cream Apply 1 application topically as needed.    . Melatonin 3 MG TABS Take 3 mg by mouth at bedtime as needed (sleep).     . multivitamin (RENA-VIT) TABS tablet Take 1 tablet by mouth at bedtime.  0  . nitroGLYCERIN (NITROSTAT) 0.4 MG SL tablet DISSOLVE ONE TABLET UNDER THE TONGUE EVERY 5 MINUTES AS NEEDED FOR CHEST PAIN.  DO NOT EXCEED A TOTAL OF 3 DOSES IN 15 MINUTES (Patient taking differently: Place 0.4 mg under the tongue every 5 (five) minutes as needed for chest pain. DISSOLVE ONE TABLET UNDER THE TONGUE EVERY 5 MINUTES AS NEEDED FOR CHEST PAIN.  DO NOT EXCEED A TOTAL OF 3 DOSES IN 15 MINUTES) 25 tablet 0  . Nutritional Supplements (FEEDING SUPPLEMENT, NEPRO CARB STEADY,) LIQD Take 237 mLs by mouth 2 (two) times daily between meals. 237 mL 0  . omeprazole (PRILOSEC) 20 MG capsule Take 20 mg by mouth daily.    . ondansetron (ZOFRAN-ODT) 4 MG disintegrating tablet DISSOLVE 1 TABLET IN MOUTH EVERY 8 HOURS AS NEEDED FOR NAUSEA AND VOMITING. PATIENT NEEDS APPOINTMENT WITH PCP TO RECEIVE FURTHER REFILLS 30 tablet 0  . PACERONE 200 MG tablet TAKE 1 TABLET BY MOUTH ONCE DAILY (NEEDS  APPOINTMENT  WITH  PCP  FOR  FURTHER  REFILLS) 30 tablet 0  . traZODone (DESYREL) 50 MG tablet TAKE 1 TABLET  BY MOUTH AT BEDTIME . APPOINTMENT REQUIRED FOR FUTURE REFILLS 30 tablet 0  . vitamin C (VITAMIN C) 500 MG tablet Take 1 tablet (500 mg total) by mouth daily.     No current facility-administered medications on file prior to visit.    There are no  Patient Instructions on file for this visit. No follow-ups on file.   Jamie Hartmann, NP

## 2019-12-22 NOTE — Telephone Encounter (Signed)
Spoke with the patient's husband and the patient's arrival time has been changed to 2:00 pm arrival to the [facility]. Patient's husband was fine with the change.

## 2019-12-24 DIAGNOSIS — E039 Hypothyroidism, unspecified: Secondary | ICD-10-CM | POA: Diagnosis not present

## 2019-12-24 DIAGNOSIS — Z992 Dependence on renal dialysis: Secondary | ICD-10-CM | POA: Diagnosis not present

## 2019-12-24 DIAGNOSIS — I5032 Chronic diastolic (congestive) heart failure: Secondary | ICD-10-CM | POA: Diagnosis not present

## 2019-12-24 DIAGNOSIS — I132 Hypertensive heart and chronic kidney disease with heart failure and with stage 5 chronic kidney disease, or end stage renal disease: Secondary | ICD-10-CM | POA: Diagnosis not present

## 2019-12-24 DIAGNOSIS — D631 Anemia in chronic kidney disease: Secondary | ICD-10-CM | POA: Diagnosis not present

## 2019-12-24 DIAGNOSIS — I4891 Unspecified atrial fibrillation: Secondary | ICD-10-CM | POA: Diagnosis not present

## 2019-12-24 DIAGNOSIS — Z7901 Long term (current) use of anticoagulants: Secondary | ICD-10-CM | POA: Diagnosis not present

## 2019-12-24 DIAGNOSIS — N186 End stage renal disease: Secondary | ICD-10-CM | POA: Diagnosis not present

## 2019-12-24 DIAGNOSIS — E1140 Type 2 diabetes mellitus with diabetic neuropathy, unspecified: Secondary | ICD-10-CM | POA: Diagnosis not present

## 2019-12-24 DIAGNOSIS — E1122 Type 2 diabetes mellitus with diabetic chronic kidney disease: Secondary | ICD-10-CM | POA: Diagnosis not present

## 2019-12-24 DIAGNOSIS — B372 Candidiasis of skin and nail: Secondary | ICD-10-CM | POA: Diagnosis not present

## 2019-12-24 DIAGNOSIS — Z951 Presence of aortocoronary bypass graft: Secondary | ICD-10-CM | POA: Diagnosis not present

## 2019-12-24 DIAGNOSIS — R1312 Dysphagia, oropharyngeal phase: Secondary | ICD-10-CM | POA: Diagnosis not present

## 2019-12-24 DIAGNOSIS — I82622 Acute embolism and thrombosis of deep veins of left upper extremity: Secondary | ICD-10-CM | POA: Diagnosis not present

## 2019-12-24 DIAGNOSIS — I2510 Atherosclerotic heart disease of native coronary artery without angina pectoris: Secondary | ICD-10-CM | POA: Diagnosis not present

## 2019-12-24 DIAGNOSIS — E785 Hyperlipidemia, unspecified: Secondary | ICD-10-CM | POA: Diagnosis not present

## 2019-12-25 ENCOUNTER — Other Ambulatory Visit: Admission: RE | Admit: 2019-12-25 | Payer: Medicare Other | Source: Ambulatory Visit

## 2019-12-25 DIAGNOSIS — Z992 Dependence on renal dialysis: Secondary | ICD-10-CM | POA: Diagnosis not present

## 2019-12-25 DIAGNOSIS — N2581 Secondary hyperparathyroidism of renal origin: Secondary | ICD-10-CM | POA: Diagnosis not present

## 2019-12-25 DIAGNOSIS — E039 Hypothyroidism, unspecified: Secondary | ICD-10-CM | POA: Diagnosis not present

## 2019-12-25 DIAGNOSIS — N186 End stage renal disease: Secondary | ICD-10-CM | POA: Diagnosis not present

## 2019-12-28 ENCOUNTER — Encounter (INDEPENDENT_AMBULATORY_CARE_PROVIDER_SITE_OTHER): Payer: Self-pay

## 2019-12-28 DIAGNOSIS — N186 End stage renal disease: Secondary | ICD-10-CM | POA: Diagnosis not present

## 2019-12-28 DIAGNOSIS — Z992 Dependence on renal dialysis: Secondary | ICD-10-CM | POA: Diagnosis not present

## 2019-12-28 DIAGNOSIS — E039 Hypothyroidism, unspecified: Secondary | ICD-10-CM | POA: Diagnosis not present

## 2019-12-28 DIAGNOSIS — N2581 Secondary hyperparathyroidism of renal origin: Secondary | ICD-10-CM | POA: Diagnosis not present

## 2019-12-28 NOTE — Progress Notes (Signed)
Patient spouse informed.

## 2019-12-29 ENCOUNTER — Other Ambulatory Visit: Payer: Medicare Other | Admitting: Adult Health Nurse Practitioner

## 2019-12-29 ENCOUNTER — Telehealth: Payer: Self-pay | Admitting: Adult Health Nurse Practitioner

## 2019-12-29 ENCOUNTER — Telehealth: Payer: Self-pay | Admitting: Family Medicine

## 2019-12-29 ENCOUNTER — Other Ambulatory Visit: Payer: Self-pay | Admitting: Family Medicine

## 2019-12-29 ENCOUNTER — Other Ambulatory Visit: Payer: Self-pay

## 2019-12-29 ENCOUNTER — Ambulatory Visit: Payer: Medicare Other | Admitting: Family Medicine

## 2019-12-29 DIAGNOSIS — N186 End stage renal disease: Secondary | ICD-10-CM

## 2019-12-29 NOTE — Telephone Encounter (Signed)
Patient's husband dropped off a independent care form to be filled out by Dr. Caryl Bis. Form is upfront in his color folder. Please fax for patient. Fax number is at the bottom of the form.

## 2019-12-29 NOTE — Telephone Encounter (Signed)
Arrived at home for scheduled appointment.  No one answered the door.  Called and left message with reason for call and call back info to reschedule. Endia Moncur K. Olena Heckle NP

## 2019-12-29 NOTE — Telephone Encounter (Signed)
Patient's husband dropped off a independent care form to be filled out by Dr. Caryl Bis. Form is upfront in his color folder. Please fax for patient. Fax number is at the bottom of the form. Form is in sign basket.  Jamie Arias,cma

## 2019-12-30 DIAGNOSIS — U071 COVID-19: Secondary | ICD-10-CM | POA: Diagnosis not present

## 2019-12-30 DIAGNOSIS — R2689 Other abnormalities of gait and mobility: Secondary | ICD-10-CM | POA: Diagnosis not present

## 2019-12-31 DIAGNOSIS — Z992 Dependence on renal dialysis: Secondary | ICD-10-CM | POA: Diagnosis not present

## 2019-12-31 DIAGNOSIS — I82622 Acute embolism and thrombosis of deep veins of left upper extremity: Secondary | ICD-10-CM | POA: Diagnosis not present

## 2019-12-31 DIAGNOSIS — E039 Hypothyroidism, unspecified: Secondary | ICD-10-CM | POA: Diagnosis not present

## 2019-12-31 DIAGNOSIS — I2510 Atherosclerotic heart disease of native coronary artery without angina pectoris: Secondary | ICD-10-CM | POA: Diagnosis not present

## 2019-12-31 DIAGNOSIS — I4891 Unspecified atrial fibrillation: Secondary | ICD-10-CM | POA: Diagnosis not present

## 2019-12-31 DIAGNOSIS — N186 End stage renal disease: Secondary | ICD-10-CM | POA: Diagnosis not present

## 2019-12-31 DIAGNOSIS — Z7901 Long term (current) use of anticoagulants: Secondary | ICD-10-CM | POA: Diagnosis not present

## 2019-12-31 DIAGNOSIS — I5032 Chronic diastolic (congestive) heart failure: Secondary | ICD-10-CM | POA: Diagnosis not present

## 2019-12-31 DIAGNOSIS — D631 Anemia in chronic kidney disease: Secondary | ICD-10-CM | POA: Diagnosis not present

## 2019-12-31 DIAGNOSIS — R1312 Dysphagia, oropharyngeal phase: Secondary | ICD-10-CM | POA: Diagnosis not present

## 2019-12-31 DIAGNOSIS — B372 Candidiasis of skin and nail: Secondary | ICD-10-CM | POA: Diagnosis not present

## 2019-12-31 DIAGNOSIS — I132 Hypertensive heart and chronic kidney disease with heart failure and with stage 5 chronic kidney disease, or end stage renal disease: Secondary | ICD-10-CM | POA: Diagnosis not present

## 2019-12-31 DIAGNOSIS — E1140 Type 2 diabetes mellitus with diabetic neuropathy, unspecified: Secondary | ICD-10-CM | POA: Diagnosis not present

## 2019-12-31 DIAGNOSIS — Z951 Presence of aortocoronary bypass graft: Secondary | ICD-10-CM | POA: Diagnosis not present

## 2019-12-31 DIAGNOSIS — E785 Hyperlipidemia, unspecified: Secondary | ICD-10-CM | POA: Diagnosis not present

## 2019-12-31 DIAGNOSIS — E1122 Type 2 diabetes mellitus with diabetic chronic kidney disease: Secondary | ICD-10-CM | POA: Diagnosis not present

## 2019-12-31 NOTE — Telephone Encounter (Signed)
This form is to help get her help at home to perform ADLs.  Please see what activities of daily living she has trouble completing on her own.  Is she able to bathe herself, groom herself, go to the bathroom on her own, dress herself, feed herself?  If she able to transfer herself out of bed and walk on her own?  If she is not able to do some of these please find out which of her medical issues make them difficult.  Thanks.

## 2019-12-31 NOTE — Telephone Encounter (Signed)
Called and LVM for the patein to call back.  Jakaya Jacobowitz,cma

## 2020-01-04 DIAGNOSIS — E039 Hypothyroidism, unspecified: Secondary | ICD-10-CM | POA: Diagnosis not present

## 2020-01-04 DIAGNOSIS — Z992 Dependence on renal dialysis: Secondary | ICD-10-CM | POA: Diagnosis not present

## 2020-01-04 DIAGNOSIS — D631 Anemia in chronic kidney disease: Secondary | ICD-10-CM | POA: Diagnosis not present

## 2020-01-04 DIAGNOSIS — N186 End stage renal disease: Secondary | ICD-10-CM | POA: Diagnosis not present

## 2020-01-04 DIAGNOSIS — Z23 Encounter for immunization: Secondary | ICD-10-CM | POA: Diagnosis not present

## 2020-01-04 DIAGNOSIS — N2581 Secondary hyperparathyroidism of renal origin: Secondary | ICD-10-CM | POA: Diagnosis not present

## 2020-01-04 DIAGNOSIS — I4811 Longstanding persistent atrial fibrillation: Secondary | ICD-10-CM | POA: Diagnosis not present

## 2020-01-05 ENCOUNTER — Ambulatory Visit: Payer: Medicare Other | Admitting: Cardiovascular Disease

## 2020-01-05 DIAGNOSIS — D631 Anemia in chronic kidney disease: Secondary | ICD-10-CM | POA: Diagnosis not present

## 2020-01-05 DIAGNOSIS — R1312 Dysphagia, oropharyngeal phase: Secondary | ICD-10-CM | POA: Diagnosis not present

## 2020-01-05 DIAGNOSIS — Z992 Dependence on renal dialysis: Secondary | ICD-10-CM | POA: Diagnosis not present

## 2020-01-05 DIAGNOSIS — B372 Candidiasis of skin and nail: Secondary | ICD-10-CM | POA: Diagnosis not present

## 2020-01-05 DIAGNOSIS — E785 Hyperlipidemia, unspecified: Secondary | ICD-10-CM | POA: Diagnosis not present

## 2020-01-05 DIAGNOSIS — I2510 Atherosclerotic heart disease of native coronary artery without angina pectoris: Secondary | ICD-10-CM | POA: Diagnosis not present

## 2020-01-05 DIAGNOSIS — I132 Hypertensive heart and chronic kidney disease with heart failure and with stage 5 chronic kidney disease, or end stage renal disease: Secondary | ICD-10-CM | POA: Diagnosis not present

## 2020-01-05 DIAGNOSIS — Z951 Presence of aortocoronary bypass graft: Secondary | ICD-10-CM | POA: Diagnosis not present

## 2020-01-05 DIAGNOSIS — N186 End stage renal disease: Secondary | ICD-10-CM | POA: Diagnosis not present

## 2020-01-05 DIAGNOSIS — E039 Hypothyroidism, unspecified: Secondary | ICD-10-CM | POA: Diagnosis not present

## 2020-01-05 DIAGNOSIS — I5032 Chronic diastolic (congestive) heart failure: Secondary | ICD-10-CM | POA: Diagnosis not present

## 2020-01-05 DIAGNOSIS — I4891 Unspecified atrial fibrillation: Secondary | ICD-10-CM | POA: Diagnosis not present

## 2020-01-05 DIAGNOSIS — I82622 Acute embolism and thrombosis of deep veins of left upper extremity: Secondary | ICD-10-CM | POA: Diagnosis not present

## 2020-01-05 DIAGNOSIS — E1122 Type 2 diabetes mellitus with diabetic chronic kidney disease: Secondary | ICD-10-CM | POA: Diagnosis not present

## 2020-01-05 DIAGNOSIS — E1140 Type 2 diabetes mellitus with diabetic neuropathy, unspecified: Secondary | ICD-10-CM | POA: Diagnosis not present

## 2020-01-05 DIAGNOSIS — Z7901 Long term (current) use of anticoagulants: Secondary | ICD-10-CM | POA: Diagnosis not present

## 2020-01-06 ENCOUNTER — Encounter: Payer: Self-pay | Admitting: Cardiovascular Disease

## 2020-01-06 NOTE — Progress Notes (Deleted)
Assessment/Plan:   ***  Subjective:   Jamie Arias was seen today in the movement disorders clinic for neurologic consultation at the request of [name], MD.  The consultation is for the evaluation of tremor.  Medical records made available to me have been reviewed.  She saw Dr. Manuella Ghazi at [facility] neurology in December, 2018 for balance change (felt to be due to diabetic peripheral neuropathy and cerebral extensive small vessel disease) as well as tremor bilaterally in the upper extremities.  She was only seen for a one-time visit.  Since that time, she has developed a jaw tremor that started about 6 months ago.  Patient is on dialysis due to her diabetes/diabetic nephropathy.  Tremor: {yes no:314532}   How long has it been going on? ***  At rest or with activation?  ***  When is it noted the most?  ***  Fam hx of tremor?  {yes WP:[id number]}  Located where?  ***  Affected by caffeine:  {yes no:314532}  Affected by alcohol:  {yes no:314532}  Affected by stress:  {yes no:314532}  Affected by fatigue:  {yes no:314532}  Spills soup if on spoon:  {yes no:314532}  Spills glass of liquid if full:  {yes no:314532}  Affects ADL's (tying shoes, brushing teeth, etc):  {yes no:314532}  Tremor inducing meds:  {yes no:314532}  Other Specific Symptoms:  Voice: *** Sleep: ***  Vivid Dreams:  {yes no:314532}  Acting out dreams:  {yes no:314532} Wet Pillows: {yes no:314532} Postural symptoms:  {yes no:314532}  Falls?  {yes no:314532} Bradykinesia symptoms: {parkinson brady:18041} Loss of smell:  {yes no:314532} Loss of taste:  {yes no:314532} Urinary Incontinence:  {yes no:314532} Difficulty Swallowing:  {yes no:314532} Handwriting, micrographia: {yes no:314532} Trouble with ADL's:  {yes no:314532}  Trouble buttoning clothing: {yes no:314532} Depression:  {yes no:314532} Memory changes:  {yes no:314532} Hallucinations:  {yes no:314532}  visual distortions: {yes no:314532} N/V:   {yes no:314532} Lightheaded:  {yes no:314532}  Syncope: {yes no:314532} Diplopia:  {yes no:314532} Dyskinesia:  {yes no:314532}   Patient had MRI of the brain on October 12, 2019.  I reviewed this.  There was moderate small vessel changes.  PREVIOUS MEDICATIONS: {Parkinson's RX:18200}  ALLERGIES:   Allergies  Allergen Reactions  . Nsaids Other (See Comments)    Contraindicated due to kidney disease.  Judeth Cornfield Reductase Inhibitors   . Doxycycline Other (See Comments)    tremor    CURRENT MEDICATIONS:  Current Outpatient Medications  Medication Instructions  . acetaminophen (TYLENOL) 650 mg, Oral, Daily PRN  . ascorbic acid (VITAMIN C) 500 mg, Oral, Daily  . atorvastatin (LIPITOR) 40 mg, Oral, Daily  . FLUoxetine (PROZAC) 20 MG capsule Take 1 capsule by mouth once daily  . fluticasone (FLONASE) 50 MCG/ACT nasal spray 2 sprays, Each Nare, Daily  . folic acid (FOLVITE) 1 mg, Oral, Daily  . gabapentin (NEURONTIN) 100 mg, Oral, 3 times daily  . hydrALAZINE (APRESOLINE) 10 mg, Oral, 2 times daily  . hydrocortisone cream 1 % Topical, As needed  . levothyroxine (SYNTHROID) 88 mcg, Oral, Daily before breakfast  . lidocaine-prilocaine (EMLA) cream 1 application, Topical, As needed  . melatonin 3 mg, Oral, At bedtime PRN  . multivitamin (RENA-VIT) TABS tablet 1 tablet, Oral, Daily at bedtime  . nitroGLYCERIN (NITROSTAT) 0.4 MG SL tablet DISSOLVE ONE TABLET UNDER THE TONGUE EVERY 5 MINUTES AS NEEDED FOR CHEST PAIN.  DO NOT EXCEED A TOTAL OF 3 DOSES IN 15 MINUTES  . Nutritional Supplements (FEEDING SUPPLEMENT, NEPRO  CARB STEADY,) LIQD 237 mLs, Oral, 2 times daily between meals  . omeprazole (PRILOSEC) 20 mg, Oral, Daily  . ondansetron (ZOFRAN-ODT) 4 MG disintegrating tablet DISSOLVE 1 TABLET IN MOUTH EVERY 8 HOURS AS NEEDED FOR NAUSEA AND VOMITING. PATIENT NEEDS APPOINTMENT WITH PCP TO RECEIVE FURTHER REFILLS  . PACERONE 200 MG tablet TAKE 1 TABLET BY MOUTH ONCE DAILY . APPOINTMENT REQUIRED  FOR FUTURE REFILLS  . traZODone (DESYREL) 50 MG tablet TAKE 1 TABLET BY MOUTH AT BEDTIME (APPOINTMENT  REQUIRED  FOR  FUTURE  REFILLS)    Objective:   PHYSICAL EXAMINATION:    VITALS:  There were no vitals filed for this visit.  GEN:  The patient appears stated age and is in NAD. HEENT:  Normocephalic, atraumatic.  The mucous membranes are moist. The superficial temporal arteries are without ropiness or tenderness. CV:  RRR Lungs:  CTAB Neck/HEME:  There are no carotid bruits bilaterally.  Neurological examination:  Orientation: The patient is alert and oriented x3.  Cranial nerves: There is good facial symmetry.  Extraocular muscles are intact. The visual fields are full to confrontational testing. The speech is fluent and clear. Soft palate rises symmetrically and there is no tongue deviation. Hearing is intact to conversational tone. Sensation: Sensation is intact to light touch throughout (facial, trunk, extremities). Vibration is intact at the bilateral big toe. There is no extinction with double simultaneous stimulation.  Motor: Strength is 5/5 in the bilateral upper and lower extremities.   Shoulder shrug is equal and symmetric.  There is no pronator drift. Deep tendon reflexes: Deep tendon reflexes are 2/4 at the bilateral biceps, triceps, brachioradialis, patella and achilles. Plantar responses are downgoing bilaterally.  Movement examination: Tone: There is ***tone in the bilateral upper extremities.  The tone in the lower extremities is ***.  Abnormal movements: *** Coordination:  There is *** decremation with RAM's, *** Gait and Station: The patient has *** difficulty arising out of a deep-seated chair without the use of the hands. The patient's stride length is ***.  The patient has a *** pull test.     I have reviewed and interpreted the following labs independently   Chemistry      Component Value Date/Time   NA 136 10/14/2019 0544   NA 138 06/23/2019 1600   NA 140  03/30/2014 1611   K 3.6 10/14/2019 0544   K 4.3 03/30/2014 1611   CL 97 (L) 10/14/2019 0544   CL 105 03/30/2014 1611   CO2 31 10/14/2019 0544   CO2 29 03/30/2014 1611   BUN 20 10/14/2019 0544   BUN 21 06/23/2019 1600   BUN 21 (H) 03/30/2014 1611   CREATININE 4.32 (H) 10/14/2019 0544   CREATININE 3.14 (H) 09/09/2015 1534   GLU 187 03/30/2014 0000      Component Value Date/Time   CALCIUM 7.9 (L) 10/14/2019 0544   CALCIUM 9.0 03/30/2014 1611   ALKPHOS 41 10/12/2019 0410   ALKPHOS 145 (H) 07/23/2013 0741   AST 55 (H) 10/12/2019 0410   AST 16 07/23/2013 0741   ALT 14 10/12/2019 0410   ALT 15 07/23/2013 0741   BILITOT 1.1 10/12/2019 0410   BILITOT 0.3 02/11/2018 1404   BILITOT 0.7 07/23/2013 0741      Lab Results  Component Value Date   TSH 17.828 (H) 03/15/2019   Lab Results  Component Value Date   WBC 5.6 10/15/2019   HGB 8.8 (L) 10/15/2019   HCT 25.1 (L) 10/15/2019   MCV 88.7 10/15/2019  PLT 154 10/15/2019      Total time spent on today's visit was ***greater than 60 minutes, including both face-to-face time and nonface-to-face time.  Time included that spent on review of records (prior notes available to me/labs/imaging if pertinent), discussing treatment and goals, answering patient's questions and coordinating care.  Cc:  [name], MD

## 2020-01-07 ENCOUNTER — Ambulatory Visit: Payer: Medicare Other | Admitting: Neurology

## 2020-01-07 DIAGNOSIS — D631 Anemia in chronic kidney disease: Secondary | ICD-10-CM | POA: Diagnosis not present

## 2020-01-07 DIAGNOSIS — Z7901 Long term (current) use of anticoagulants: Secondary | ICD-10-CM | POA: Diagnosis not present

## 2020-01-07 DIAGNOSIS — I2510 Atherosclerotic heart disease of native coronary artery without angina pectoris: Secondary | ICD-10-CM | POA: Diagnosis not present

## 2020-01-07 DIAGNOSIS — E785 Hyperlipidemia, unspecified: Secondary | ICD-10-CM | POA: Diagnosis not present

## 2020-01-07 DIAGNOSIS — Z951 Presence of aortocoronary bypass graft: Secondary | ICD-10-CM | POA: Diagnosis not present

## 2020-01-07 DIAGNOSIS — I5032 Chronic diastolic (congestive) heart failure: Secondary | ICD-10-CM | POA: Diagnosis not present

## 2020-01-07 DIAGNOSIS — I4891 Unspecified atrial fibrillation: Secondary | ICD-10-CM | POA: Diagnosis not present

## 2020-01-07 DIAGNOSIS — Z992 Dependence on renal dialysis: Secondary | ICD-10-CM | POA: Diagnosis not present

## 2020-01-07 DIAGNOSIS — B372 Candidiasis of skin and nail: Secondary | ICD-10-CM | POA: Diagnosis not present

## 2020-01-07 DIAGNOSIS — I82622 Acute embolism and thrombosis of deep veins of left upper extremity: Secondary | ICD-10-CM | POA: Diagnosis not present

## 2020-01-07 DIAGNOSIS — R1312 Dysphagia, oropharyngeal phase: Secondary | ICD-10-CM | POA: Diagnosis not present

## 2020-01-07 DIAGNOSIS — E1122 Type 2 diabetes mellitus with diabetic chronic kidney disease: Secondary | ICD-10-CM | POA: Diagnosis not present

## 2020-01-07 DIAGNOSIS — E039 Hypothyroidism, unspecified: Secondary | ICD-10-CM | POA: Diagnosis not present

## 2020-01-07 DIAGNOSIS — I132 Hypertensive heart and chronic kidney disease with heart failure and with stage 5 chronic kidney disease, or end stage renal disease: Secondary | ICD-10-CM | POA: Diagnosis not present

## 2020-01-07 DIAGNOSIS — N186 End stage renal disease: Secondary | ICD-10-CM | POA: Diagnosis not present

## 2020-01-07 DIAGNOSIS — E1140 Type 2 diabetes mellitus with diabetic neuropathy, unspecified: Secondary | ICD-10-CM | POA: Diagnosis not present

## 2020-01-08 ENCOUNTER — Other Ambulatory Visit: Payer: Medicare Other | Attending: Vascular Surgery

## 2020-01-08 DIAGNOSIS — N2581 Secondary hyperparathyroidism of renal origin: Secondary | ICD-10-CM | POA: Diagnosis not present

## 2020-01-08 DIAGNOSIS — D631 Anemia in chronic kidney disease: Secondary | ICD-10-CM | POA: Diagnosis not present

## 2020-01-08 DIAGNOSIS — E039 Hypothyroidism, unspecified: Secondary | ICD-10-CM | POA: Diagnosis not present

## 2020-01-08 DIAGNOSIS — Z992 Dependence on renal dialysis: Secondary | ICD-10-CM | POA: Diagnosis not present

## 2020-01-08 DIAGNOSIS — Z23 Encounter for immunization: Secondary | ICD-10-CM | POA: Diagnosis not present

## 2020-01-08 DIAGNOSIS — N186 End stage renal disease: Secondary | ICD-10-CM | POA: Diagnosis not present

## 2020-01-11 ENCOUNTER — Other Ambulatory Visit (INDEPENDENT_AMBULATORY_CARE_PROVIDER_SITE_OTHER): Payer: Self-pay | Admitting: Nurse Practitioner

## 2020-01-12 ENCOUNTER — Encounter: Admission: RE | Payer: Self-pay | Source: Home / Self Care

## 2020-01-12 ENCOUNTER — Ambulatory Visit: Admission: RE | Admit: 2020-01-12 | Payer: Medicare Other | Source: Home / Self Care | Admitting: Vascular Surgery

## 2020-01-12 DIAGNOSIS — N186 End stage renal disease: Secondary | ICD-10-CM

## 2020-01-12 SURGERY — A/V SHUNTOGRAM
Anesthesia: Moderate Sedation | Laterality: Left

## 2020-01-13 NOTE — Telephone Encounter (Signed)
Lvm for the patient to call back for form completion. Jamie Arias,cma

## 2020-01-14 DIAGNOSIS — N186 End stage renal disease: Secondary | ICD-10-CM | POA: Diagnosis not present

## 2020-01-14 DIAGNOSIS — Z7901 Long term (current) use of anticoagulants: Secondary | ICD-10-CM | POA: Diagnosis not present

## 2020-01-14 DIAGNOSIS — E1122 Type 2 diabetes mellitus with diabetic chronic kidney disease: Secondary | ICD-10-CM | POA: Diagnosis not present

## 2020-01-14 DIAGNOSIS — Z992 Dependence on renal dialysis: Secondary | ICD-10-CM | POA: Diagnosis not present

## 2020-01-14 DIAGNOSIS — B372 Candidiasis of skin and nail: Secondary | ICD-10-CM | POA: Diagnosis not present

## 2020-01-14 DIAGNOSIS — I132 Hypertensive heart and chronic kidney disease with heart failure and with stage 5 chronic kidney disease, or end stage renal disease: Secondary | ICD-10-CM | POA: Diagnosis not present

## 2020-01-14 DIAGNOSIS — I2510 Atherosclerotic heart disease of native coronary artery without angina pectoris: Secondary | ICD-10-CM | POA: Diagnosis not present

## 2020-01-14 DIAGNOSIS — E1140 Type 2 diabetes mellitus with diabetic neuropathy, unspecified: Secondary | ICD-10-CM | POA: Diagnosis not present

## 2020-01-14 DIAGNOSIS — E785 Hyperlipidemia, unspecified: Secondary | ICD-10-CM | POA: Diagnosis not present

## 2020-01-14 DIAGNOSIS — E039 Hypothyroidism, unspecified: Secondary | ICD-10-CM | POA: Diagnosis not present

## 2020-01-14 DIAGNOSIS — Z951 Presence of aortocoronary bypass graft: Secondary | ICD-10-CM | POA: Diagnosis not present

## 2020-01-14 DIAGNOSIS — I82622 Acute embolism and thrombosis of deep veins of left upper extremity: Secondary | ICD-10-CM | POA: Diagnosis not present

## 2020-01-14 DIAGNOSIS — I5032 Chronic diastolic (congestive) heart failure: Secondary | ICD-10-CM | POA: Diagnosis not present

## 2020-01-14 DIAGNOSIS — R1312 Dysphagia, oropharyngeal phase: Secondary | ICD-10-CM | POA: Diagnosis not present

## 2020-01-14 DIAGNOSIS — D631 Anemia in chronic kidney disease: Secondary | ICD-10-CM | POA: Diagnosis not present

## 2020-01-14 DIAGNOSIS — I4891 Unspecified atrial fibrillation: Secondary | ICD-10-CM | POA: Diagnosis not present

## 2020-01-15 DIAGNOSIS — N2581 Secondary hyperparathyroidism of renal origin: Secondary | ICD-10-CM | POA: Diagnosis not present

## 2020-01-15 DIAGNOSIS — E039 Hypothyroidism, unspecified: Secondary | ICD-10-CM | POA: Diagnosis not present

## 2020-01-15 DIAGNOSIS — Z992 Dependence on renal dialysis: Secondary | ICD-10-CM | POA: Diagnosis not present

## 2020-01-15 DIAGNOSIS — N186 End stage renal disease: Secondary | ICD-10-CM | POA: Diagnosis not present

## 2020-01-15 DIAGNOSIS — D631 Anemia in chronic kidney disease: Secondary | ICD-10-CM | POA: Diagnosis not present

## 2020-01-15 DIAGNOSIS — Z23 Encounter for immunization: Secondary | ICD-10-CM | POA: Diagnosis not present

## 2020-01-18 ENCOUNTER — Telehealth (INDEPENDENT_AMBULATORY_CARE_PROVIDER_SITE_OTHER): Payer: Self-pay

## 2020-01-18 NOTE — Telephone Encounter (Signed)
Please mail a letter asking them to contact the office.

## 2020-01-18 NOTE — Telephone Encounter (Signed)
Cannot reach patient I have  made 4 attempts to reach patient about the form we received.  Lilly Gasser,cma

## 2020-01-18 NOTE — Telephone Encounter (Signed)
Spoke with the patient's spouse and the patient is scheduled with Dr. Delana Meyer for a left arm shuntogram on 01/26/20 with a 8:15 am arrival time to the MM. Covid testing on 01/22/20 between 8-1 pm at the [facility]. Pre-procedure instructions were discussed and will be mailed.

## 2020-01-19 DIAGNOSIS — I82622 Acute embolism and thrombosis of deep veins of left upper extremity: Secondary | ICD-10-CM | POA: Diagnosis not present

## 2020-01-19 DIAGNOSIS — Z951 Presence of aortocoronary bypass graft: Secondary | ICD-10-CM | POA: Diagnosis not present

## 2020-01-19 DIAGNOSIS — B372 Candidiasis of skin and nail: Secondary | ICD-10-CM | POA: Diagnosis not present

## 2020-01-19 DIAGNOSIS — E1122 Type 2 diabetes mellitus with diabetic chronic kidney disease: Secondary | ICD-10-CM | POA: Diagnosis not present

## 2020-01-19 DIAGNOSIS — I5032 Chronic diastolic (congestive) heart failure: Secondary | ICD-10-CM | POA: Diagnosis not present

## 2020-01-19 DIAGNOSIS — I4891 Unspecified atrial fibrillation: Secondary | ICD-10-CM | POA: Diagnosis not present

## 2020-01-19 DIAGNOSIS — E785 Hyperlipidemia, unspecified: Secondary | ICD-10-CM | POA: Diagnosis not present

## 2020-01-19 DIAGNOSIS — N186 End stage renal disease: Secondary | ICD-10-CM | POA: Diagnosis not present

## 2020-01-19 DIAGNOSIS — I132 Hypertensive heart and chronic kidney disease with heart failure and with stage 5 chronic kidney disease, or end stage renal disease: Secondary | ICD-10-CM | POA: Diagnosis not present

## 2020-01-19 DIAGNOSIS — Z7901 Long term (current) use of anticoagulants: Secondary | ICD-10-CM | POA: Diagnosis not present

## 2020-01-19 DIAGNOSIS — R1312 Dysphagia, oropharyngeal phase: Secondary | ICD-10-CM | POA: Diagnosis not present

## 2020-01-19 DIAGNOSIS — E039 Hypothyroidism, unspecified: Secondary | ICD-10-CM | POA: Diagnosis not present

## 2020-01-19 DIAGNOSIS — Z992 Dependence on renal dialysis: Secondary | ICD-10-CM | POA: Diagnosis not present

## 2020-01-19 DIAGNOSIS — D631 Anemia in chronic kidney disease: Secondary | ICD-10-CM | POA: Diagnosis not present

## 2020-01-19 DIAGNOSIS — E1140 Type 2 diabetes mellitus with diabetic neuropathy, unspecified: Secondary | ICD-10-CM | POA: Diagnosis not present

## 2020-01-19 DIAGNOSIS — I2510 Atherosclerotic heart disease of native coronary artery without angina pectoris: Secondary | ICD-10-CM | POA: Diagnosis not present

## 2020-01-19 NOTE — Progress Notes (Signed)
After several attempts to reach the patient a letter was mailed today for the patient to call the office to discuss the form we received by fax.  Belissa Kooy,cma

## 2020-01-19 NOTE — Telephone Encounter (Signed)
Letter mailed.  Dray Dente,cma

## 2020-01-21 DIAGNOSIS — B372 Candidiasis of skin and nail: Secondary | ICD-10-CM | POA: Diagnosis not present

## 2020-01-21 DIAGNOSIS — Z992 Dependence on renal dialysis: Secondary | ICD-10-CM | POA: Diagnosis not present

## 2020-01-21 DIAGNOSIS — I82622 Acute embolism and thrombosis of deep veins of left upper extremity: Secondary | ICD-10-CM | POA: Diagnosis not present

## 2020-01-21 DIAGNOSIS — I2510 Atherosclerotic heart disease of native coronary artery without angina pectoris: Secondary | ICD-10-CM | POA: Diagnosis not present

## 2020-01-21 DIAGNOSIS — E039 Hypothyroidism, unspecified: Secondary | ICD-10-CM | POA: Diagnosis not present

## 2020-01-21 DIAGNOSIS — E1122 Type 2 diabetes mellitus with diabetic chronic kidney disease: Secondary | ICD-10-CM | POA: Diagnosis not present

## 2020-01-21 DIAGNOSIS — E1140 Type 2 diabetes mellitus with diabetic neuropathy, unspecified: Secondary | ICD-10-CM | POA: Diagnosis not present

## 2020-01-21 DIAGNOSIS — I4891 Unspecified atrial fibrillation: Secondary | ICD-10-CM | POA: Diagnosis not present

## 2020-01-21 DIAGNOSIS — Z7901 Long term (current) use of anticoagulants: Secondary | ICD-10-CM | POA: Diagnosis not present

## 2020-01-21 DIAGNOSIS — D631 Anemia in chronic kidney disease: Secondary | ICD-10-CM | POA: Diagnosis not present

## 2020-01-21 DIAGNOSIS — R1312 Dysphagia, oropharyngeal phase: Secondary | ICD-10-CM | POA: Diagnosis not present

## 2020-01-21 DIAGNOSIS — N186 End stage renal disease: Secondary | ICD-10-CM | POA: Diagnosis not present

## 2020-01-21 DIAGNOSIS — I5032 Chronic diastolic (congestive) heart failure: Secondary | ICD-10-CM | POA: Diagnosis not present

## 2020-01-21 DIAGNOSIS — E785 Hyperlipidemia, unspecified: Secondary | ICD-10-CM | POA: Diagnosis not present

## 2020-01-21 DIAGNOSIS — I132 Hypertensive heart and chronic kidney disease with heart failure and with stage 5 chronic kidney disease, or end stage renal disease: Secondary | ICD-10-CM | POA: Diagnosis not present

## 2020-01-21 DIAGNOSIS — Z951 Presence of aortocoronary bypass graft: Secondary | ICD-10-CM | POA: Diagnosis not present

## 2020-01-22 ENCOUNTER — Other Ambulatory Visit: Payer: Medicare Other | Attending: Vascular Surgery

## 2020-01-25 ENCOUNTER — Telehealth: Payer: Self-pay | Admitting: Family Medicine

## 2020-01-25 NOTE — Telephone Encounter (Signed)
Patient's husband called in stated that he received a call from office about paperwork

## 2020-01-26 ENCOUNTER — Encounter: Admission: RE | Payer: Self-pay | Source: Home / Self Care

## 2020-01-26 ENCOUNTER — Ambulatory Visit: Admission: RE | Admit: 2020-01-26 | Payer: Medicare Other | Source: Home / Self Care | Admitting: Vascular Surgery

## 2020-01-26 ENCOUNTER — Other Ambulatory Visit (INDEPENDENT_AMBULATORY_CARE_PROVIDER_SITE_OTHER): Payer: Self-pay | Admitting: Nurse Practitioner

## 2020-01-26 ENCOUNTER — Ambulatory Visit: Payer: Medicare Other | Admitting: Family Medicine

## 2020-01-26 DIAGNOSIS — Z951 Presence of aortocoronary bypass graft: Secondary | ICD-10-CM | POA: Diagnosis not present

## 2020-01-26 DIAGNOSIS — E1122 Type 2 diabetes mellitus with diabetic chronic kidney disease: Secondary | ICD-10-CM | POA: Diagnosis not present

## 2020-01-26 DIAGNOSIS — E1140 Type 2 diabetes mellitus with diabetic neuropathy, unspecified: Secondary | ICD-10-CM | POA: Diagnosis not present

## 2020-01-26 DIAGNOSIS — E785 Hyperlipidemia, unspecified: Secondary | ICD-10-CM | POA: Diagnosis not present

## 2020-01-26 DIAGNOSIS — I82622 Acute embolism and thrombosis of deep veins of left upper extremity: Secondary | ICD-10-CM | POA: Diagnosis not present

## 2020-01-26 DIAGNOSIS — I132 Hypertensive heart and chronic kidney disease with heart failure and with stage 5 chronic kidney disease, or end stage renal disease: Secondary | ICD-10-CM | POA: Diagnosis not present

## 2020-01-26 DIAGNOSIS — I2510 Atherosclerotic heart disease of native coronary artery without angina pectoris: Secondary | ICD-10-CM | POA: Diagnosis not present

## 2020-01-26 DIAGNOSIS — D631 Anemia in chronic kidney disease: Secondary | ICD-10-CM | POA: Diagnosis not present

## 2020-01-26 DIAGNOSIS — E039 Hypothyroidism, unspecified: Secondary | ICD-10-CM | POA: Diagnosis not present

## 2020-01-26 DIAGNOSIS — I5032 Chronic diastolic (congestive) heart failure: Secondary | ICD-10-CM | POA: Diagnosis not present

## 2020-01-26 DIAGNOSIS — N186 End stage renal disease: Secondary | ICD-10-CM | POA: Diagnosis not present

## 2020-01-26 DIAGNOSIS — R1312 Dysphagia, oropharyngeal phase: Secondary | ICD-10-CM | POA: Diagnosis not present

## 2020-01-26 DIAGNOSIS — B372 Candidiasis of skin and nail: Secondary | ICD-10-CM | POA: Diagnosis not present

## 2020-01-26 DIAGNOSIS — Z992 Dependence on renal dialysis: Secondary | ICD-10-CM | POA: Diagnosis not present

## 2020-01-26 DIAGNOSIS — I4891 Unspecified atrial fibrillation: Secondary | ICD-10-CM | POA: Diagnosis not present

## 2020-01-26 DIAGNOSIS — Z7901 Long term (current) use of anticoagulants: Secondary | ICD-10-CM | POA: Diagnosis not present

## 2020-01-26 SURGERY — A/V SHUNTOGRAM
Anesthesia: Moderate Sedation | Laterality: Left

## 2020-01-26 NOTE — Telephone Encounter (Signed)
Spoke with patients husband. The only ADL the patient is able to do is feed her self. She needs assistance with dressing, bathing, grooming, toileting, and transferring out of bed as well as walking. I will get her forms completed.

## 2020-01-26 NOTE — Telephone Encounter (Signed)
Form completed and faxed to [place]. Fax confirmation given.  Makailey Hodgkin,cma

## 2020-01-26 NOTE — Telephone Encounter (Signed)
Patient's husband spoke with the provider about form, it was completed and faxed to [facility] healthcare.  Lateasha Breuer,cma

## 2020-01-27 DIAGNOSIS — I5032 Chronic diastolic (congestive) heart failure: Secondary | ICD-10-CM | POA: Diagnosis not present

## 2020-01-27 DIAGNOSIS — Z992 Dependence on renal dialysis: Secondary | ICD-10-CM | POA: Diagnosis not present

## 2020-01-27 DIAGNOSIS — E039 Hypothyroidism, unspecified: Secondary | ICD-10-CM | POA: Diagnosis not present

## 2020-01-27 DIAGNOSIS — Z7901 Long term (current) use of anticoagulants: Secondary | ICD-10-CM | POA: Diagnosis not present

## 2020-01-27 DIAGNOSIS — N186 End stage renal disease: Secondary | ICD-10-CM | POA: Diagnosis not present

## 2020-01-27 DIAGNOSIS — I4891 Unspecified atrial fibrillation: Secondary | ICD-10-CM | POA: Diagnosis not present

## 2020-01-27 DIAGNOSIS — E785 Hyperlipidemia, unspecified: Secondary | ICD-10-CM | POA: Diagnosis not present

## 2020-01-27 DIAGNOSIS — E1140 Type 2 diabetes mellitus with diabetic neuropathy, unspecified: Secondary | ICD-10-CM | POA: Diagnosis not present

## 2020-01-27 DIAGNOSIS — R1312 Dysphagia, oropharyngeal phase: Secondary | ICD-10-CM | POA: Diagnosis not present

## 2020-01-27 DIAGNOSIS — I132 Hypertensive heart and chronic kidney disease with heart failure and with stage 5 chronic kidney disease, or end stage renal disease: Secondary | ICD-10-CM | POA: Diagnosis not present

## 2020-01-27 DIAGNOSIS — I82622 Acute embolism and thrombosis of deep veins of left upper extremity: Secondary | ICD-10-CM | POA: Diagnosis not present

## 2020-01-27 DIAGNOSIS — Z951 Presence of aortocoronary bypass graft: Secondary | ICD-10-CM | POA: Diagnosis not present

## 2020-01-27 DIAGNOSIS — E1122 Type 2 diabetes mellitus with diabetic chronic kidney disease: Secondary | ICD-10-CM | POA: Diagnosis not present

## 2020-01-27 DIAGNOSIS — B372 Candidiasis of skin and nail: Secondary | ICD-10-CM | POA: Diagnosis not present

## 2020-01-27 DIAGNOSIS — I2510 Atherosclerotic heart disease of native coronary artery without angina pectoris: Secondary | ICD-10-CM | POA: Diagnosis not present

## 2020-01-27 DIAGNOSIS — D631 Anemia in chronic kidney disease: Secondary | ICD-10-CM | POA: Diagnosis not present

## 2020-01-30 DIAGNOSIS — R2689 Other abnormalities of gait and mobility: Secondary | ICD-10-CM | POA: Diagnosis not present

## 2020-01-30 DIAGNOSIS — U071 COVID-19: Secondary | ICD-10-CM | POA: Diagnosis not present

## 2020-02-01 NOTE — Progress Notes (Deleted)
Cardiology Office Note    Date:  02/01/2020   ID:  Jamie Arias, DOB 05-19-1955, MRN [id number]  PCP:  [name], MD  Cardiologist:  Kathlyn Sacramento, MD  Electrophysiologist:  None   Chief Complaint: Follow-up  History of Present Illness:   Jamie Arias is a 64 y.o. female with history of CAD status post CABG in 06/2013, HFpEF, PAF on Eliquis diagnosed in 08/2018, pulmonary hypertension, ESRD on HD, DM2 with diabetic neuropathy, COVID-19 in 09/2018, DVT of the left upper extremity in 02/2019, anemia of chronic disease, HTN, HLD, MRSA bacteremia with encephalopathy, gait instability, and orthostatic hypotension who presents for follow-up of her CAD and A. fib.  She underwent CABG in 06/2013.  Most recent ischemic evaluation in 10/2015 was low risk with an EF of 55 to 65%.  She was admitted with [id number] in 06/6566 complicated by MRSA bacteremia with encephalopathy.  She had a prolonged hospital admission from 04/24/2018 through 04/25/2019 for MRSA bacteremia complicated by sepsis, metabolic encephalopathy, acute hypoxic respiratory failure requiring intubation, and DVT of the left upper extremity.  TEE was recommended though unable to be performed secondary to encephalopathy.  Her most recent echo form 02/2019 showed an EF of 60-65% wih Gr2DD.  She was admitted to the hospital in 10/2019 with abdominal pain suspected to be from gastritis/esophagitis.  EGD showed some erythema and whitish plaques in the esophagus.  High-sensitivity troponin of 37 with an unchanged delta troponin.  She was noted to have a poor appetite.  She was treated with IV Diflucan and IV Protonix.  She underwent dialysis cath removal complicated by significant bleeding with hemoglobin dropping to 6.7 requiring PRBC transfusion and holding of her Eliquis.  ***   Labs independently reviewed: 10/2019  -Hgb 8.8, PLT 154, potassium 3.6, albumin 2.6, AST 55, ALT 14 09/2019 - A1c 6.1 06/2019 - TC 167, TG 110, HDL 53, LDL  92 03/2019 - TSH 17.828, free T4 low at 4.1  Past Medical History:  Diagnosis Date  . Anemia   . Arm DVT (deep venous thromboembolism), acute, left ([place]) 02/13/2019  . Arthritis    upper back and neck   . Chronic diastolic CHF (congestive heart failure) ([place])    a. Due to ischemic cardiomyopathy. EF as low as 35%, improved to normal s/p CABG; b. echo 07/06/13: EF 55-60%, no RWMAs, mod TR, trivial pericardial effusion not c/w tamponade physiology;  c. 10/2015 Echo: EF 65%, Gr1 DD, triv AI, mild MR, mildly dil LA, mod TR, PASP 61mmHg.  Marland Kitchen Coronary artery disease    a. NSTEMI 06/2013; b.cath: severe three-vessel CAD w/ EF 30% & mild-mod MR; c. s/p 3 vessel CABG 07/02/13 (LIMA-LAD, SVG-OM, and SVG-RPDA);  d. 10/2015 MV: no ischemia/infarct.  . Diabetes mellitus without complication ([place])   . Diabetic neuropathy ([place])   . ESRD (end stage renal disease) ([place])    a. 12/2015 initiated - mwf dialysis.  Marland Kitchen GERD (gastroesophageal reflux disease)   . Hyperlipidemia   . Hypertension   . Hypothyroidism   . Myocardial infarction ([place]) 2014  . Pleural effusion 2015  . Pulmonary hypertension ([place])   . Wears dentures    full lower    Past Surgical History:  Procedure Laterality Date  . A/V FISTULAGRAM Right 12/17/2016   Procedure: A/V Fistulagram;  Surgeon: Algernon Huxley, MD;  Location: [place] [facility];  Service: Cardiovascular;  Laterality: Right;  . A/V FISTULAGRAM Right 01/07/2017   Procedure: A/V Fistulagram;  Surgeon: Lucky Cowboy,  Erskine Squibb, MD;  Location: [place] [facility];  Service: Cardiovascular;  Laterality: Right;  . A/V FISTULAGRAM Right 12/03/2017   Procedure: A/V FISTULAGRAM;  Surgeon: Katha Cabal, MD;  Location: [place] [facility];  Service: Cardiovascular;  Laterality: Right;  . A/V FISTULAGRAM Right 02/23/2019   Procedure: A/V Fistulagram;  Surgeon: Algernon Huxley, MD;  Location: [place] [facility];  Service: Cardiovascular;  Laterality: Right;  . A/V SHUNT INTERVENTION N/A 02/22/2017    Procedure: A/V SHUNT INTERVENTION;  Surgeon: Algernon Huxley, MD;  Location: [place] [facility];  Service: Cardiovascular;  Laterality: N/A;  . AV FISTULA PLACEMENT Right 02/03/2016   Procedure: INSERTION OF ARTERIOVENOUS (AV) GORE-TEX GRAFT ARM ( BRACH / AXILLARY );  Surgeon: Katha Cabal, MD;  Location: [facility];  Service: Vascular;  Laterality: Right;  . AV FISTULA PLACEMENT Left 07/09/2019   Procedure: INSERTION OF ARTERIOVENOUS (AV) GORE-TEX GRAFT ARM ( BRACHIAL AXILLARY );  Surgeon: Algernon Huxley, MD;  Location: [facility];  Service: Vascular;  Laterality: Left;  . [place] REMOVAL Right 03/04/2019   Procedure: REMOVAL OF ARTERIOVENOUS GORETEX GRAFT ([place]);  Surgeon: Algernon Huxley, MD;  Location: [facility];  Service: Vascular;  Laterality: Right;  . CARDIAC CATHETERIZATION    . CATARACT EXTRACTION Bilateral   . CHOLECYSTECTOMY N/A 12/09/2014   Procedure: LAPAROSCOPIC CHOLECYSTECTOMY;  Surgeon: Marlyce [name], MD;  Location: [facility];  Service: General;  Laterality: N/A;  . CORONARY ARTERY BYPASS GRAFT N/A 07/02/2013   Procedure: CORONARY ARTERY BYPASS GRAFTING (CABG);  Surgeon: Ivin [name], MD;  Location: [place];  Service: Open Heart Surgery;  Laterality: N/A;  CABG x three, using left internal mammary artery and right leg greater saphenous vein harvested endoscopically  . DIALYSIS/PERMA CATHETER REMOVAL N/A 10/13/2019   Procedure: DIALYSIS/PERMA CATHETER REMOVAL;  Surgeon: Algernon Huxley, MD;  Location: [place] [facility];  Service: Cardiovascular;  Laterality: N/A;  . ESOPHAGOGASTRODUODENOSCOPY N/A 04/11/2019   Procedure: ESOPHAGOGASTRODUODENOSCOPY (EGD);  Surgeon: Jonathon Bellows, MD;  Location: [facility];  Service: Gastroenterology;  Laterality: N/A;  . ESOPHAGOGASTRODUODENOSCOPY (EGD) WITH PROPOFOL N/A 11/24/2015   Procedure: ESOPHAGOGASTRODUODENOSCOPY (EGD) WITH PROPOFOL;  Surgeon: [name], MD;  Location: [place];  Service: Endoscopy;  Laterality: N/A;  Diabetic -  insulin  . ESOPHAGOGASTRODUODENOSCOPY (EGD) WITH PROPOFOL N/A 10/10/2019   Procedure: ESOPHAGOGASTRODUODENOSCOPY (EGD) WITH PROPOFOL;  Surgeon: Virgel Manifold, MD;  Location: [facility];  Service: Endoscopy;  Laterality: N/A;  . EYE SURGERY Bilateral    Cataract Extraction with IOL  . INTRAOPERATIVE TRANSESOPHAGEAL ECHOCARDIOGRAM N/A 07/02/2013   Procedure: INTRAOPERATIVE TRANSESOPHAGEAL ECHOCARDIOGRAM;  Surgeon: Ivin [name], MD;  Location: [place];  Service: Open Heart Surgery;  Laterality: N/A;  . PEG PLACEMENT N/A 03/03/2019   Procedure: PERCUTANEOUS ENDOSCOPIC GASTROSTOMY (PEG) PLACEMENT;  Surgeon: Virgel Manifold, MD;  Location: [facility];  Service: Endoscopy;  Laterality: N/A;  . PERIPHERAL VASCULAR CATHETERIZATION Right 12/06/2015   Procedure: Dialysis/Perma Catheter Insertion;  Surgeon: Katha Cabal, MD;  Location: [place] [facility];  Service: Cardiovascular;  Laterality: Right;  . PERIPHERAL VASCULAR THROMBECTOMY Right 09/28/2016   Procedure: Peripheral Vascular Thrombectomy;  Surgeon: Katha Cabal, MD;  Location: [place] [facility];  Service: Cardiovascular;  Laterality: Right;  . PERIPHERAL VASCULAR THROMBECTOMY Right 05/20/2018   Procedure: PERIPHERAL VASCULAR THROMBECTOMY;  Surgeon: Katha Cabal, MD;  Location: [place] [facility];  Service: Cardiovascular;  Laterality: Right;  . PORTA CATH REMOVAL N/A 06/01/2016   Procedure: Glori [name] Cath  Removal;  Surgeon: Katha Cabal, MD;  Location: [place] [facility];  Service: Cardiovascular;  Laterality: N/A;  . THORACENTESIS Left 2015    Current Medications: No outpatient medications have been marked as taking for the 02/02/20 encounter (Appointment) with Rise Mu, PA-C.    Allergies:   Nsaids, 5-alpha reductase inhibitors, and Doxycycline   Social History   Socioeconomic History  . Marital status: Married    Spouse name: Not on file  . Number of children: Not on file  . Years of  education: Not on file  . Highest education level: Not on file  Occupational History  . Occupation: works for [employer]  Tobacco Use  . Smoking status: Never Smoker  . Smokeless tobacco: Never Used  Vaping Use  . Vaping Use: Never used  Substance and Sexual Activity  . Alcohol use: No    Alcohol/week: 0.0 standard drinks  . Drug use: No  . Sexual activity: Not on file  Other Topics Concern  . Not on file  Social History Narrative   Patient lives at home with her husband. Patient has 4 adult children.   Social Determinants of Health   Financial Resource Strain:   . Difficulty of Paying Living Expenses: Not on file  Food Insecurity:   . Worried About Charity fundraiser in the Last Year: Not on file  . Ran Out of Food in the Last Year: Not on file  Transportation Needs:   . Lack of Transportation (Medical): Not on file  . Lack of Transportation (Non-Medical): Not on file  Physical Activity:   . Days of Exercise per Week: Not on file  . Minutes of Exercise per Session: Not on file  Stress:   . Feeling of Stress : Not on file  Social Connections:   . Frequency of Communication with Friends and Family: Not on file  . Frequency of Social Gatherings with Friends and Family: Not on file  . Attends Religious Services: Not on file  . Active Member of Clubs or Organizations: Not on file  . Attends Archivist Meetings: Not on file  . Marital Status: Not on file     Family History:  The patient's family history includes COPD in her mother; Cancer in her mother; Diabetes in her father and paternal grandfather; Heart disease in her maternal grandmother; Pulmonary embolism in her father. There is no history of Colon cancer, Colon polyps, Esophageal cancer, Pancreatic cancer, or Liver disease.  ROS:   ROS   EKGs/Labs/Other Studies Reviewed:    Studies reviewed were summarized above. The additional studies were reviewed today:  2D echo 02/2019: 1. Left  ventricular ejection fraction, by visual estimation, is 60 to  65%. The left ventricle has normal function. There is mildly increased  left ventricular hypertrophy.  2. Elevated left atrial pressure.  3. Left ventricular diastolic parameters are consistent with Grade II  diastolic dysfunction (pseudonormalization).  4. The left ventricle has no regional wall motion abnormalities.  5. Global right ventricle has mildly reduced systolic function.The right  ventricular size is mildly enlarged. Mildly increased right ventricular  wall thickness.  6. Left atrial size was normal.  7. Right atrial size was normal.  8. Mild mitral annular calcification.  9. The mitral valve is normal in structure. Trace mitral valve  regurgitation. No evidence of mitral stenosis.  10. The tricuspid valve is normal in structure. Tricuspid valve  regurgitation moderate-severe.  11. The aortic valve is tricuspid. Aortic valve  regurgitation is trivial.  Mild to moderate aortic valve sclerosis/calcification without any evidence  of aortic stenosis.  12. The pulmonic valve was grossly normal. Pulmonic valve regurgitation is  trivial.  13. Mildly elevated pulmonary artery systolic pressure.  14. The inferior vena cava is normal in size with <50% respiratory  variability, suggesting right atrial pressure of 8 mmHg. __________  Nuclear stress test 10/2015:  There was no ST segment deviation noted during stress.  No T wave inversion was noted during stress.  The study is normal.  This is a low risk study.  The left ventricular ejection fraction is normal (55-65%).     EKG:  EKG is ordered today.  The EKG ordered today demonstrates ***  Recent Labs: 03/01/2019: B Natriuretic Peptide 720.0 03/15/2019: TSH 17.828 03/18/2019: Magnesium 1.7 10/12/2019: ALT 14 10/14/2019: BUN 20; Creatinine, Ser 4.32; Potassium 3.6; Sodium 136 10/15/2019: Hemoglobin 8.8; Platelets 154  Recent Lipid Panel    Component  Value Date/Time   CHOL 167 06/30/2019 0554   CHOL 153 02/11/2018 1404   TRIG 110 06/30/2019 0554   HDL 53 06/30/2019 0554   HDL 43 02/11/2018 1404   CHOLHDL 3.2 06/30/2019 0554   VLDL 22 06/30/2019 0554   LDLCALC 92 06/30/2019 0554   LDLCALC 51 02/11/2018 1404   LDLDIRECT 60 02/11/2018 1404   LDLDIRECT 126.9 10/31/2012 0801    PHYSICAL EXAM:    VS:  There were no vitals taken for this visit.  BMI: There is no height or weight on file to calculate BMI.  Physical Exam  Wt Readings from Last 3 Encounters:  12/17/19 165 lb (74.8 kg)  11/03/19 171 lb (77.6 kg)  10/13/19 163 lb 9.3 oz (74.2 kg)     ASSESSMENT & PLAN:   1. CAD status post CABG:  2. HFpEF/pulmonary hypertension:  3. PAF:  4. ESRD:  5. HTN: Blood pressure ***  6. HLD: LDL 92 from 06/2019 with goal being less than 70.  ALT normal in 10/2019.  ***  Disposition: F/u with Dr. Fletcher Anon or an APP in ***.   Medication Adjustments/Labs and Tests Ordered: Current medicines are reviewed at length with the patient today.  Concerns regarding medicines are outlined above. Medication changes, Labs and Tests ordered today are summarized above and listed in the Patient Instructions accessible in Encounters.   Signed, Christell Faith, PA-C 02/01/2020 7:29 AM     [facility] [address] [place], [place] [address] [phone]

## 2020-02-02 ENCOUNTER — Observation Stay: Payer: Medicare Other

## 2020-02-02 ENCOUNTER — Other Ambulatory Visit: Payer: Self-pay

## 2020-02-02 ENCOUNTER — Ambulatory Visit: Payer: Medicare Other | Admitting: Physician Assistant

## 2020-02-02 ENCOUNTER — Inpatient Hospital Stay
Admission: EM | Admit: 2020-02-02 | Discharge: 2020-02-13 | DRG: 252 | Disposition: A | Discharge status: Home / Self Care | Payer: Medicare Other | Attending: Family Medicine | Admitting: Family Medicine

## 2020-02-02 DIAGNOSIS — G9341 Metabolic encephalopathy: Secondary | ICD-10-CM | POA: Diagnosis present

## 2020-02-02 DIAGNOSIS — Z881 Allergy status to other antibiotic agents status: Secondary | ICD-10-CM

## 2020-02-02 DIAGNOSIS — R4182 Altered mental status, unspecified: Secondary | ICD-10-CM | POA: Diagnosis not present

## 2020-02-02 DIAGNOSIS — I482 Chronic atrial fibrillation, unspecified: Secondary | ICD-10-CM | POA: Diagnosis not present

## 2020-02-02 DIAGNOSIS — I255 Ischemic cardiomyopathy: Secondary | ICD-10-CM | POA: Diagnosis present

## 2020-02-02 DIAGNOSIS — E1140 Type 2 diabetes mellitus with diabetic neuropathy, unspecified: Secondary | ICD-10-CM | POA: Diagnosis present

## 2020-02-02 DIAGNOSIS — E1122 Type 2 diabetes mellitus with diabetic chronic kidney disease: Secondary | ICD-10-CM | POA: Diagnosis not present

## 2020-02-02 DIAGNOSIS — N2581 Secondary hyperparathyroidism of renal origin: Secondary | ICD-10-CM | POA: Diagnosis present

## 2020-02-02 DIAGNOSIS — Z992 Dependence on renal dialysis: Secondary | ICD-10-CM | POA: Diagnosis not present

## 2020-02-02 DIAGNOSIS — T8089XA Other complications following infusion, transfusion and therapeutic injection, initial encounter: Secondary | ICD-10-CM | POA: Diagnosis not present

## 2020-02-02 DIAGNOSIS — Z79899 Other long term (current) drug therapy: Secondary | ICD-10-CM

## 2020-02-02 DIAGNOSIS — Z801 Family history of malignant neoplasm of trachea, bronchus and lung: Secondary | ICD-10-CM

## 2020-02-02 DIAGNOSIS — I480 Paroxysmal atrial fibrillation: Secondary | ICD-10-CM | POA: Diagnosis present

## 2020-02-02 DIAGNOSIS — L8990 Pressure ulcer of unspecified site, unspecified stage: Secondary | ICD-10-CM | POA: Diagnosis present

## 2020-02-02 DIAGNOSIS — Z20822 Contact with and (suspected) exposure to covid-19: Secondary | ICD-10-CM | POA: Diagnosis present

## 2020-02-02 DIAGNOSIS — T82838A Hemorrhage of vascular prosthetic devices, implants and grafts, initial encounter: Secondary | ICD-10-CM | POA: Diagnosis not present

## 2020-02-02 DIAGNOSIS — K219 Gastro-esophageal reflux disease without esophagitis: Secondary | ICD-10-CM | POA: Diagnosis present

## 2020-02-02 DIAGNOSIS — I10 Essential (primary) hypertension: Secondary | ICD-10-CM | POA: Diagnosis present

## 2020-02-02 DIAGNOSIS — E1165 Type 2 diabetes mellitus with hyperglycemia: Secondary | ICD-10-CM | POA: Diagnosis not present

## 2020-02-02 DIAGNOSIS — Z993 Dependence on wheelchair: Secondary | ICD-10-CM | POA: Diagnosis not present

## 2020-02-02 DIAGNOSIS — N186 End stage renal disease: Secondary | ICD-10-CM | POA: Diagnosis not present

## 2020-02-02 DIAGNOSIS — I2510 Atherosclerotic heart disease of native coronary artery without angina pectoris: Secondary | ICD-10-CM | POA: Diagnosis present

## 2020-02-02 DIAGNOSIS — Z79890 Hormone replacement therapy (postmenopausal): Secondary | ICD-10-CM

## 2020-02-02 DIAGNOSIS — Z833 Family history of diabetes mellitus: Secondary | ICD-10-CM

## 2020-02-02 DIAGNOSIS — G9349 Other encephalopathy: Secondary | ICD-10-CM | POA: Diagnosis present

## 2020-02-02 DIAGNOSIS — I272 Pulmonary hypertension, unspecified: Secondary | ICD-10-CM | POA: Diagnosis not present

## 2020-02-02 DIAGNOSIS — R0902 Hypoxemia: Secondary | ICD-10-CM | POA: Diagnosis not present

## 2020-02-02 DIAGNOSIS — Z888 Allergy status to other drugs, medicaments and biological substances status: Secondary | ICD-10-CM

## 2020-02-02 DIAGNOSIS — E11649 Type 2 diabetes mellitus with hypoglycemia without coma: Secondary | ICD-10-CM | POA: Diagnosis present

## 2020-02-02 DIAGNOSIS — I132 Hypertensive heart and chronic kidney disease with heart failure and with stage 5 chronic kidney disease, or end stage renal disease: Principal | ICD-10-CM | POA: Diagnosis present

## 2020-02-02 DIAGNOSIS — N185 Chronic kidney disease, stage 5: Secondary | ICD-10-CM | POA: Diagnosis not present

## 2020-02-02 DIAGNOSIS — L89152 Pressure ulcer of sacral region, stage 2: Secondary | ICD-10-CM | POA: Diagnosis not present

## 2020-02-02 DIAGNOSIS — D631 Anemia in chronic kidney disease: Secondary | ICD-10-CM | POA: Diagnosis not present

## 2020-02-02 DIAGNOSIS — R532 Functional quadriplegia: Secondary | ICD-10-CM | POA: Diagnosis not present

## 2020-02-02 DIAGNOSIS — T82898A Other specified complication of vascular prosthetic devices, implants and grafts, initial encounter: Secondary | ICD-10-CM | POA: Diagnosis present

## 2020-02-02 DIAGNOSIS — J90 Pleural effusion, not elsewhere classified: Secondary | ICD-10-CM | POA: Diagnosis not present

## 2020-02-02 DIAGNOSIS — Y828 Other medical devices associated with adverse incidents: Secondary | ICD-10-CM | POA: Diagnosis present

## 2020-02-02 DIAGNOSIS — R9082 White matter disease, unspecified: Secondary | ICD-10-CM | POA: Diagnosis not present

## 2020-02-02 DIAGNOSIS — E785 Hyperlipidemia, unspecified: Secondary | ICD-10-CM | POA: Diagnosis not present

## 2020-02-02 DIAGNOSIS — Z7401 Bed confinement status: Secondary | ICD-10-CM

## 2020-02-02 DIAGNOSIS — E162 Hypoglycemia, unspecified: Secondary | ICD-10-CM

## 2020-02-02 DIAGNOSIS — Z8616 Personal history of COVID-19: Secondary | ICD-10-CM | POA: Diagnosis not present

## 2020-02-02 DIAGNOSIS — I252 Old myocardial infarction: Secondary | ICD-10-CM

## 2020-02-02 DIAGNOSIS — Z951 Presence of aortocoronary bypass graft: Secondary | ICD-10-CM

## 2020-02-02 DIAGNOSIS — M47812 Spondylosis without myelopathy or radiculopathy, cervical region: Secondary | ICD-10-CM | POA: Diagnosis present

## 2020-02-02 DIAGNOSIS — I5032 Chronic diastolic (congestive) heart failure: Secondary | ICD-10-CM | POA: Diagnosis not present

## 2020-02-02 DIAGNOSIS — Z886 Allergy status to analgesic agent status: Secondary | ICD-10-CM

## 2020-02-02 DIAGNOSIS — M2550 Pain in unspecified joint: Secondary | ICD-10-CM | POA: Diagnosis not present

## 2020-02-02 DIAGNOSIS — Z86718 Personal history of other venous thrombosis and embolism: Secondary | ICD-10-CM

## 2020-02-02 DIAGNOSIS — I120 Hypertensive chronic kidney disease with stage 5 chronic kidney disease or end stage renal disease: Secondary | ICD-10-CM | POA: Diagnosis not present

## 2020-02-02 DIAGNOSIS — R531 Weakness: Secondary | ICD-10-CM | POA: Diagnosis present

## 2020-02-02 DIAGNOSIS — F32A Depression, unspecified: Secondary | ICD-10-CM | POA: Diagnosis present

## 2020-02-02 DIAGNOSIS — E039 Hypothyroidism, unspecified: Secondary | ICD-10-CM | POA: Diagnosis present

## 2020-02-02 DIAGNOSIS — Z9115 Patient's noncompliance with renal dialysis: Secondary | ICD-10-CM

## 2020-02-02 DIAGNOSIS — Z8249 Family history of ischemic heart disease and other diseases of the circulatory system: Secondary | ICD-10-CM

## 2020-02-02 DIAGNOSIS — R5381 Other malaise: Secondary | ICD-10-CM | POA: Diagnosis not present

## 2020-02-02 DIAGNOSIS — Z8719 Personal history of other diseases of the digestive system: Secondary | ICD-10-CM

## 2020-02-02 DIAGNOSIS — Z825 Family history of asthma and other chronic lower respiratory diseases: Secondary | ICD-10-CM

## 2020-02-02 DIAGNOSIS — Z743 Need for continuous supervision: Secondary | ICD-10-CM | POA: Diagnosis not present

## 2020-02-02 DIAGNOSIS — T829XXA Unspecified complication of cardiac and vascular prosthetic device, implant and graft, initial encounter: Secondary | ICD-10-CM | POA: Diagnosis not present

## 2020-02-02 DIAGNOSIS — T829XXD Unspecified complication of cardiac and vascular prosthetic device, implant and graft, subsequent encounter: Secondary | ICD-10-CM | POA: Diagnosis not present

## 2020-02-02 LAB — CBC WITH DIFFERENTIAL/PLATELET
Abs Immature Granulocytes: 0.04 10*3/uL (ref 0.00–0.07)
Basophils Absolute: 0.1 10*3/uL (ref 0.0–0.1)
Basophils Relative: 1 %
Eosinophils Absolute: 0.1 10*3/uL (ref 0.0–0.5)
Eosinophils Relative: 2 %
HCT: 27.6 % — ABNORMAL LOW (ref 36.0–46.0)
Hemoglobin: 8.9 g/dL — ABNORMAL LOW (ref 12.0–15.0)
Immature Granulocytes: 1 %
Lymphocytes Relative: 10 %
Lymphs Abs: 0.6 10*3/uL — ABNORMAL LOW (ref 0.7–4.0)
MCH: 29.3 pg (ref 26.0–34.0)
MCHC: 32.2 g/dL (ref 30.0–36.0)
MCV: 90.8 fL (ref 80.0–100.0)
Monocytes Absolute: 0.3 10*3/uL (ref 0.1–1.0)
Monocytes Relative: 5 %
Neutro Abs: 5 10*3/uL (ref 1.7–7.7)
Neutrophils Relative %: 81 %
Platelets: 138 10*3/uL — ABNORMAL LOW (ref 150–400)
RBC: 3.04 MIL/uL — ABNORMAL LOW (ref 3.87–5.11)
RDW: 16.9 % — ABNORMAL HIGH (ref 11.5–15.5)
WBC: 6.1 10*3/uL (ref 4.0–10.5)
nRBC: 0 % (ref 0.0–0.2)

## 2020-02-02 LAB — BASIC METABOLIC PANEL
Anion gap: 19 — ABNORMAL HIGH (ref 5–15)
BUN: 68 mg/dL — ABNORMAL HIGH (ref 8–23)
CO2: 22 mmol/L (ref 22–32)
Calcium: 8.5 mg/dL — ABNORMAL LOW (ref 8.9–10.3)
Chloride: 98 mmol/L (ref 98–111)
Creatinine, Ser: 16.46 mg/dL — ABNORMAL HIGH (ref 0.44–1.00)
GFR, Estimated: 2 mL/min — ABNORMAL LOW (ref >60)
Glucose, Bld: 67 mg/dL — ABNORMAL LOW (ref 70–99)
Potassium: 3.7 mmol/L (ref 3.5–5.1)
Sodium: 139 mmol/L (ref 135–145)

## 2020-02-02 LAB — RESP PANEL BY RT-PCR (FLU A&B, COVID) ARPGX2
Influenza A by PCR: NEGATIVE
Influenza B by PCR: NEGATIVE
SARS Coronavirus 2 by RT PCR: NEGATIVE

## 2020-02-02 LAB — CBG MONITORING, ED: Glucose-Capillary: 90 mg/dL (ref 70–99)

## 2020-02-02 MED ORDER — ONDANSETRON HCL 4 MG/2ML IJ SOLN
4.0000 mg | Freq: Four times a day (QID) | INTRAMUSCULAR | Status: DC | PRN
  Administered 2020-02-12: 4 mg via INTRAVENOUS
  Filled 2020-02-02: qty 2

## 2020-02-02 MED ORDER — APIXABAN 5 MG PO TABS
5.0000 mg | ORAL_TABLET | Freq: Two times a day (BID) | ORAL | Status: DC
  Administered 2020-02-02: 5 mg via ORAL
  Filled 2020-02-02 (×2): qty 1

## 2020-02-02 MED ORDER — ACETAMINOPHEN 325 MG PO TABS
650.0000 mg | ORAL_TABLET | Freq: Four times a day (QID) | ORAL | Status: DC | PRN
  Administered 2020-02-11: 650 mg via ORAL
  Filled 2020-02-02: qty 2

## 2020-02-02 MED ORDER — ACETAMINOPHEN 650 MG RE SUPP: 650.0000 mg | Freq: Four times a day (QID) | RECTAL | Status: DC | PRN

## 2020-02-02 MED ORDER — HEPARIN SODIUM (PORCINE) 5000 UNIT/ML IJ SOLN: 5000.0000 [IU] | Freq: Three times a day (TID) | INTRAMUSCULAR | Status: DC

## 2020-02-02 MED ORDER — ONDANSETRON HCL 4 MG PO TABS: 4.0000 mg | ORAL_TABLET | Freq: Four times a day (QID) | ORAL | Status: DC | PRN

## 2020-02-02 NOTE — H&P (Signed)
History and Physical    Jamie Arias JSH:[id number] DOB: 07-29-1955 DOA: 02/02/2020  PCP: [name], MD   Patient coming from: Home  I have personally briefly reviewed patient's old medical records in [place]  Chief Complaint: Missed dialysis  HPI: Jamie Arias is a 64 y.o. female with medical history significant for ESRD on HD MWF with anemia of CKD, HTN, CAD, A. fib, hypothyroidism, depression, diastolic heart failure who was referred to the ED from the dialysis center after having missed 5 dialysis sessions.  Patient is wheelchair and bedbound and total care dependent for ADLs though can feed herself.  Her husband is her caregiver and most of the history is taken from him.  He says she missed the last 5 dialysis sessions because she simply did not feel to go.  Over the past week he has noted her to have less of an appetite, sleeping much more.  She had an episode of diarrhea about a week ago but that has subsided.  She has had no shortness of breath or swelling, no nausea or vomiting headache.  She denies chest pain, fever or chills or cough. ED Course: On arrival in the ER she was afebrile, BP 170/96 pulse 64 respirations 18 with O2 sat 97% on room air BMP showed normal potassium of 3.7 normal bicarb of 22 but with anion gap of 19, calcium 8.5.  Blood sugar was low at 67.  Renal function as expected for ESRD.  CBC significant for hemoglobin of 8.9 which is her baseline.  WBC not elevated.  EKG and chest x-ray not done.  Patient noted to be somewhat slow to respond while in the ER.  The ER provider spoke with nephrologist, Dr. Candiss Norse, while patient not considered to be uremic recommends admission for dialysis in the a.m.  Hospitalist consulted for admission.   Review of Systems: As per HPI otherwise all other systems on review of systems negative.    Past Medical History:  Diagnosis Date  . Anemia   . Arm DVT (deep venous thromboembolism), acute, left ([place]) 02/13/2019   . Arthritis    upper back and neck   . Chronic diastolic CHF (congestive heart failure) ([place])    a. Due to ischemic cardiomyopathy. EF as low as 35%, improved to normal s/p CABG; b. echo 07/06/13: EF 55-60%, no RWMAs, mod TR, trivial pericardial effusion not c/w tamponade physiology;  c. 10/2015 Echo: EF 65%, Gr1 DD, triv AI, mild MR, mildly dil LA, mod TR, PASP 20mmHg.  Marland Kitchen Coronary artery disease    a. NSTEMI 06/2013; b.cath: severe three-vessel CAD w/ EF 30% & mild-mod MR; c. s/p 3 vessel CABG 07/02/13 (LIMA-LAD, SVG-OM, and SVG-RPDA);  d. 10/2015 MV: no ischemia/infarct.  . Diabetes mellitus without complication ([place])   . Diabetic neuropathy ([place])   . ESRD (end stage renal disease) ([place])    a. 12/2015 initiated - mwf dialysis.  Marland Kitchen GERD (gastroesophageal reflux disease)   . Hyperlipidemia   . Hypertension   . Hypothyroidism   . Myocardial infarction ([place]) 2014  . Pleural effusion 2015  . Pulmonary hypertension ([place])   . Wears dentures    full lower    Past Surgical History:  Procedure Laterality Date  . A/V FISTULAGRAM Right 12/17/2016   Procedure: A/V Fistulagram;  Surgeon: Algernon Huxley, MD;  Location: [facility];  Service: Cardiovascular;  Laterality: Right;  . A/V FISTULAGRAM Right 01/07/2017   Procedure: A/V Fistulagram;  Surgeon: Leotis Pain  S, MD;  Location: [place] [facility];  Service: Cardiovascular;  Laterality: Right;  . A/V FISTULAGRAM Right 12/03/2017   Procedure: A/V FISTULAGRAM;  Surgeon: Katha Cabal, MD;  Location: [facility];  Service: Cardiovascular;  Laterality: Right;  . A/V FISTULAGRAM Right 02/23/2019   Procedure: A/V Fistulagram;  Surgeon: Algernon Huxley, MD;  Location: [facility];  Service: Cardiovascular;  Laterality: Right;  . A/V SHUNT INTERVENTION N/A 02/22/2017   Procedure: A/V SHUNT INTERVENTION;  Surgeon: Algernon Huxley, MD;  Location: [facility] [facility];  Service: Cardiovascular;  Laterality: N/A;  . AV FISTULA PLACEMENT  Right 02/03/2016   Procedure: INSERTION OF ARTERIOVENOUS (AV) GORE-TEX GRAFT ARM ( BRACH / AXILLARY );  Surgeon: Katha Cabal, MD;  Location: [facility];  Service: Vascular;  Laterality: Right;  . AV FISTULA PLACEMENT Left 07/09/2019   Procedure: INSERTION OF ARTERIOVENOUS (AV) GORE-TEX GRAFT ARM ( BRACHIAL AXILLARY );  Surgeon: Algernon Huxley, MD;  Location: [facility];  Service: Vascular;  Laterality: Left;  . [place] REMOVAL Right 03/04/2019   Procedure: REMOVAL OF ARTERIOVENOUS GORETEX GRAFT ([place]);  Surgeon: Algernon Huxley, MD;  Location: [facility];  Service: Vascular;  Laterality: Right;  . CARDIAC CATHETERIZATION    . CATARACT EXTRACTION Bilateral   . CHOLECYSTECTOMY N/A 12/09/2014   Procedure: LAPAROSCOPIC CHOLECYSTECTOMY;  Surgeon: Marlyce [name], MD;  Location: [facility];  Service: General;  Laterality: N/A;  . CORONARY ARTERY BYPASS GRAFT N/A 07/02/2013   Procedure: CORONARY ARTERY BYPASS GRAFTING (CABG);  Surgeon: Ivin [name], MD;  Location: [facility];  Service: Open Heart Surgery;  Laterality: N/A;  CABG x three, using left internal mammary artery and right leg greater saphenous vein harvested endoscopically  . DIALYSIS/PERMA CATHETER REMOVAL N/A 10/13/2019   Procedure: DIALYSIS/PERMA CATHETER REMOVAL;  Surgeon: Algernon Huxley, MD;  Location: [facility];  Service: Cardiovascular;  Laterality: N/A;  . ESOPHAGOGASTRODUODENOSCOPY N/A 04/11/2019   Procedure: ESOPHAGOGASTRODUODENOSCOPY (EGD);  Surgeon: Jonathon Bellows, MD;  Location[facility];  Service: Gastroenterology;  Laterality: N/A;  . ESOPHAGOGASTRODUODENOSCOPY (EGD) WITH PROPOFOL N/A 11/24/2015   Procedure: ESOPHAGOGASTRODUODENOSCOPY (EGD) WITH PROPOFOL;  Surgeon: [name], MD;  Location: [facility];  Service: Endoscopy;  Laterality: N/A;  Diabetic - insulin  . ESOPHAGOGASTRODUODENOSCOPY (EGD) WITH PROPOFOL N/A 10/10/2019   Procedure: ESOPHAGOGASTRODUODENOSCOPY (EGD) WITH PROPOFOL;  Surgeon: Virgel Manifold, MD;   Location: [facility]OS[facility];  Service: Endoscopy;  Laterality: N/A;  . EYE SURGERY Bilateral    Cataract Extraction with IOL  . INTRAOPERATIVE TRANSESOPHAGEAL ECHOCARDIOGRAM N/A 07/02/2013   Procedure: INTRAOPERATIVE TRANSESOPHAGEAL ECHOCARDIOGRAM;  Surgeon: Ivin [name], MD;  Location: [facility];  Service: Open Heart Surgery;  Laterality: N/A;  . PEG PLACEMENT N/A 03/03/2019   Procedure: PERCUTANEOUS ENDOSCOPIC GASTROSTOMY (PEG) PLACEMENT;  Surgeon: Virgel Manifold, MD;  Location: [facility];  Service: Endoscopy;  Laterality: N/A;  . PERIPHERAL VASCULAR CATHETERIZATION Right 12/06/2015   Procedure: Dialysis/Perma Catheter Insertion;  Surgeon: Katha Cabal, MD;  Location: [place] [facility];  Service: Cardiovascular;  Laterality: Right;  . PERIPHERAL VASCULAR THROMBECTOMY Right 09/28/2016   Procedure: Peripheral Vascular Thrombectomy;  Surgeon: Katha Cabal, MD;  Location: [facility] [facility];  Service: Cardiovascular;  Laterality: Right;  . PERIPHERAL VASCULAR THROMBECTOMY Right 05/20/2018   Procedure: PERIPHERAL VASCULAR THROMBECTOMY;  Surgeon: Katha Cabal, MD;  Location: [place] [facility];  Service: Cardiovascular;  Laterality: Right;  . PORTA CATH REMOVAL N/A 06/01/2016   Procedure: Glori [name] Cath Removal;  Surgeon: Katha Cabal, MD;  Location: [place] [facility];  Service: Cardiovascular;  Laterality: N/A;  . THORACENTESIS Left 2015     reports that she has never smoked. She has never used smokeless tobacco. She reports that she does not drink alcohol and does not use drugs.  Allergies  Allergen Reactions  . Nsaids Other (See Comments)    Contraindicated due to kidney disease.  Judeth [place] Reductase Inhibitors   . Doxycycline Other (See Comments)    tremor    Family History  Problem Relation Age of Onset  . COPD Mother   . Cancer Mother        Lung  . Pulmonary embolism Father   . Diabetes Father   . Diabetes Paternal Grandfather   . Heart disease  Maternal Grandmother   . Colon cancer Neg Hx   . Colon polyps Neg Hx   . Esophageal cancer Neg Hx   . Pancreatic cancer Neg Hx   . Liver disease Neg Hx       Prior to Admission medications   Medication Sig Start Date End Date Taking? Authorizing Provider  acetaminophen (TYLENOL) 325 MG tablet Take 650 mg by mouth daily as needed for moderate pain or headache.     [provider]  atorvastatin (LIPITOR) 40 MG tablet Take 1 tablet (40 mg total) by mouth daily. 08/28/19   [name], MD  FLUoxetine (PROZAC) 20 MG capsule Take 1 capsule by mouth once daily 12/29/19   [name], MD  fluticasone [facility]) 50 MCG/ACT nasal spray Place 2 sprays into both nostrils daily. Patient taking differently: Place 2 sprays into both nostrils daily as needed for allergies or rhinitis.  09/17/17   [name], MD  folic acid (FOLVITE) 1 MG tablet Take 1 tablet (1 mg total) by mouth daily. 04/21/19   Sidney Ace, MD  gabapentin (NEURONTIN) 100 MG capsule Take 100 mg by mouth 3 (three) times daily.    [provider]  hydrALAZINE (APRESOLINE) 10 MG tablet Take 1 tablet (10 mg total) by mouth in the morning and at bedtime. 11/25/19   Marrianne Mood D, PA-C  hydrocortisone cream 1 % Apply topically as needed for itching. 04/20/19   Sidney Ace, MD  levothyroxine (SYNTHROID) 88 MCG tablet Take 1 tablet (88 mcg total) by mouth daily before breakfast. 08/28/19   [name], MD  lidocaine-prilocaine (EMLA) cream Apply 1 application topically as needed.    [provider]  Melatonin 3 MG TABS Take 3 mg by mouth at bedtime as needed (sleep).     [provider]  multivitamin (RENA-VIT) TABS tablet Take 1 tablet by mouth at bedtime. 09/09/18   Hosie Poisson, MD  nitroGLYCERIN (NITROSTAT) 0.4 MG SL tablet DISSOLVE ONE TABLET UNDER THE TONGUE EVERY 5 MINUTES AS NEEDED FOR CHEST PAIN.  DO NOT EXCEED A TOTAL OF 3 DOSES IN 15 MINUTES Patient taking  differently: Place 0.4 mg under the tongue every 5 (five) minutes as needed for chest pain. DISSOLVE ONE TABLET UNDER THE TONGUE EVERY 5 MINUTES AS NEEDED FOR CHEST PAIN.  DO NOT EXCEED A TOTAL OF 3 DOSES IN 15 MINUTES 08/18/18   Wellington Hampshire, MD  Nutritional Supplements (FEEDING SUPPLEMENT, NEPRO CARB STEADY,) LIQD Take 237 mLs by mouth 2 (two) times daily between meals. 10/15/19   [name], MD  omeprazole (PRILOSEC) 20 MG capsule Take 20 mg by mouth daily.    [provider]  ondansetron (  ZOFRAN-ODT) 4 MG disintegrating tablet DISSOLVE 1 TABLET IN MOUTH EVERY 8 HOURS AS NEEDED FOR NAUSEA AND VOMITING. PATIENT NEEDS APPOINTMENT WITH PCP TO RECEIVE FURTHER REFILLS 12/15/19   [name], MD  PACERONE 200 MG tablet TAKE 1 TABLET BY MOUTH ONCE DAILY . APPOINTMENT REQUIRED FOR FUTURE REFILLS 12/29/19   [name], MD  traZODone (DESYREL) 50 MG tablet TAKE 1 TABLET BY MOUTH AT BEDTIME (APPOINTMENT  REQUIRED  FOR  FUTURE  REFILLS) 12/29/19   [name], MD  vitamin C (VITAMIN C) 500 MG tablet Take 1 tablet (500 mg total) by mouth daily. 09/10/18   Hosie Poisson, MD    Physical Exam: Vitals:   02/02/20 2000 02/02/20 2015 02/02/20 2030 02/02/20 2045  BP: (!) 165/51  (!) 149/60   Pulse: 61 66 62   Resp: 16 17 16 19   Temp:      TempSrc:      SpO2: 90% 90% 92%   Weight:      Height:         Vitals:   02/02/20 2000 02/02/20 2015 02/02/20 2030 02/02/20 2045  BP: (!) 165/51  (!) 149/60   Pulse: 61 66 62   Resp: 16 17 16 19   Temp:      TempSrc:      SpO2: 90% 90% 92%   Weight:      Height:          Constitutional:  Ill appearing, lethargic and oriented x 2 . Not in any apparent distress HEENT:      Head: Normocephalic and atraumatic.         Eyes: PERLA, EOMI, Conjunctivae are normal. Sclera is non-icteric.       Mouth/Throat: Mucous membranes are moist.       Neck: Supple with no signs of meningismus. Cardiovascular: Regular rate and rhythm. No  murmurs, gallops, or rubs. 2+ symmetrical distal pulses are present . No JVD. No LE edema Respiratory: Respiratory effort normal .Lungs sounds clear bilaterally. No wheezes, crackles, or rhonchi.  Gastrointestinal: Soft, non tender, and non distended with positive bowel sounds. No rebound or guarding. Genitourinary: No CVA tenderness. Musculoskeletal: Nontender with normal range of motion in all extremities. No cyanosis, or erythema of extremities. Neurologic:  Face is symmetric. Moving all extremities. No gross focal neurologic deficits . Skin: Skin is warm, dry.  No rash or ulcers Psychiatric: Mood and affect are normal    Labs on Admission: I have personally reviewed following labs and imaging studies  CBC: Recent Labs  Lab 02/02/20 1941  WBC 6.1  NEUTROABS 5.0  HGB 8.9*  HCT 27.6*  MCV 90.8  PLT 081*   Basic Metabolic Panel: Recent Labs  Lab 02/02/20 1941  NA 139  K 3.7  CL 98  CO2 22  GLUCOSE 67*  BUN 68*  CREATININE 16.46*  CALCIUM 8.5*   GFR: Estimated Creatinine Clearance: 3.3 mL/min (A) (by C-G formula based on SCr of 16.46 mg/dL (H)). Liver Function Tests: No results for input(s): AST, ALT, ALKPHOS, BILITOT, PROT, ALBUMIN in the last 168 hours. No results for input(s): LIPASE, AMYLASE in the last 168 hours. No results for input(s): AMMONIA in the last 168 hours. Coagulation Profile: No results for input(s): INR, PROTIME in the last 168 hours. Cardiac Enzymes: No results for input(s): CKTOTAL, CKMB, CKMBINDEX, TROPONINI in the last 168 hours. BNP (last 3 results) No results for input(s): PROBNP in the last 8760 hours. HbA1C: No results for input(s): HGBA1C in the  last 72 hours. CBG: No results for input(s): GLUCAP in the last 168 hours. Lipid Profile: No results for input(s): CHOL, HDL, LDLCALC, TRIG, CHOLHDL, LDLDIRECT in the last 72 hours. Thyroid Function Tests: No results for input(s): TSH, T4TOTAL, FREET4, T3FREE, THYROIDAB in the last 72  hours. Anemia Panel: No results for input(s): VITAMINB12, FOLATE, FERRITIN, TIBC, IRON, RETICCTPCT in the last 72 hours. Urine analysis:    Component Value Date/Time   COLORURINE YELLOW (A) 10/08/2019 1431   APPEARANCEUR TURBID (A) 10/08/2019 1431   APPEARANCEUR Hazy 07/23/2013 0741   LABSPEC 1.010 10/08/2019 1431   LABSPEC 1.016 07/23/2013 0741   PHURINE 7.0 10/08/2019 1431   GLUCOSEU NEGATIVE 10/08/2019 1431   GLUCOSEU Negative 07/23/2013 0741   HGBUR SMALL (A) 10/08/2019 1431   BILIRUBINUR NEGATIVE 10/08/2019 1431   BILIRUBINUR Negative 07/23/2013 0741   KETONESUR 5 (A) 10/08/2019 1431   PROTEINUR >=300 (A) 10/08/2019 1431   UROBILINOGEN 0.2 06/30/2013 2213   NITRITE NEGATIVE 10/08/2019 1431   LEUKOCYTESUR MODERATE (A) 10/08/2019 1431   LEUKOCYTESUR Negative 07/23/2013 0741    Radiological Exams on Admission: No results found.   Assessment/Plan 64 year old female, wheelchair-bound dependent and most activities of ADLs, with a history of ESRD on HD MWF with anemia of CKD, HTN, CAD, A. fib, hypothyroidism, depression, diastolic heart failure who was referred to the ED from the dialysis center after having missed 5 dialysis sessions.  Symptomatic for a week of decreased appetite and lethargy.  Acute metabolic encephalopathy Missed dialysis -Patient presents with increased lethargy, sleeping a lot eating less over the past week which is outside her baseline. -Possible early uremic symptoms from having missed 5 dialysis sessions -Elevated anion gap with normal potassium and bicarb -Blood pressure slightly elevated with systolic in the 818E and no symptoms of pulmonary edema -Follow chest x-ray, EKG    ESRD (end stage renal disease) ([place]) Missed dialysis -Patient symptomatic for lethargy, acute encephalopathy only, no symptoms of fluid overload, acute heart failure, hyperkalemia -Dr. Candiss Norse consulted from the emergency room and will dialyze patient in the a.m. -Nephrology  consult placed    Hypoglycemia -Mild hypoglycemia of 67 in the ER -Encourage oral intake and monitor for further symptoms    Anemia of chronic renal failure, stage 5 (HCC) -Hemoglobin 8.5 which is patient's baseline    Hypothyroid -Continue levothyroxine    HTN (hypertension) -Continue hydralazine    Coronary artery disease -No complaints of chest pain.  Baseline EKG ordered    Atrial fibrillation, paroxysmal (HCC) -Continue Eliquis and amiodarone pending med rec -Awaiting EKG at time of this note    Depression -Continue home meds    Chronic diastolic CHF (congestive heart failure) ([place]) -Patient appears euvolemic -CXR being ordered  Functional quadriplegia -Patient mostly bedbound wheelchair-bound cared for by husband dependent for all activities of daily living -Increased nursing assistance    DVT prophylaxis: Eliquis Code Status: full code  Family Communication: Husband Disposition Plan: Back to previous home environment Consults called: Nephrology Status: Observation    Athena Masse MD [facility]     02/02/2020, 9:17 PM

## 2020-02-02 NOTE — ED Triage Notes (Signed)
Pt BIB EMS from home - family called as pt has missed 5 dialysis sessions. Pt states that she doesn't want to go to dialysis because the staff are mean to her. Her dialysis center told her she must be seen in ED before returning for dialysis

## 2020-02-02 NOTE — ED Provider Notes (Addendum)
[facility] Emergency Department Provider Note  ____________________________________________   I have reviewed the triage vital signs and the nursing notes.   HISTORY  Chief Complaint Dialysis clearance  History limited by: Not Limited   HPI Jamie Arias is a 64 y.o. female who presents to the emergency department today so she can be cleared to return to dialysis.  Patient states she is missed her last 5 dialysis sessions because she did not want to go.  When family called dialysis center today they stated she would need to come to the emergency department to be evaluated prior to returning to dialysis.  The patient denies any shortness of breath or recent illness.  Records reviewed. Per medical record review patient has a history of ESRD on dialysis.   Past Medical History:  Diagnosis Date  . Anemia   . Arm DVT (deep venous thromboembolism), acute, left ([place]) 02/13/2019  . Arthritis    upper back and neck   . Chronic diastolic CHF (congestive heart failure) ([place])    a. Due to ischemic cardiomyopathy. EF as low as 35%, improved to normal s/p CABG; b. echo 07/06/13: EF 55-60%, no RWMAs, mod TR, trivial pericardial effusion not c/w tamponade physiology;  c. 10/2015 Echo: EF 65%, Gr1 DD, triv AI, mild MR, mildly dil LA, mod TR, PASP 63mmHg.  Marland Kitchen Coronary artery disease    a. NSTEMI 06/2013; b.cath: severe three-vessel CAD w/ EF 30% & mild-mod MR; c. s/p 3 vessel CABG 07/02/13 (LIMA-LAD, SVG-OM, and SVG-RPDA);  d. 10/2015 MV: no ischemia/infarct.  . Diabetes mellitus without complication ([place])   . Diabetic neuropathy ([place])   . ESRD (end stage renal disease) ([place])    a. 12/2015 initiated - mwf dialysis.  Marland Kitchen GERD (gastroesophageal reflux disease)   . Hyperlipidemia   . Hypertension   . Hypothyroidism   . Myocardial infarction ([place]) 2014  . Pleural effusion 2015  . Pulmonary hypertension ([place])   . Wears dentures    full lower    Patient Active Problem List    Diagnosis Date Noted  . Tremor 11/03/2019  . DNR (do not resuscitate) discussion   . Unresponsive episode   . Acute cystitis without hematuria   . Epigastric abdominal pain   . Abdominal pain   . Lethargy   . Mobility impaired 09/23/2019  . Unspecified complication of cardiac and vascular prosthetic device, implant and graft, initial encounter 09/14/2019  . Complication of vascular dialysis catheter 09/09/2019  . History of 2019 novel coronavirus disease (COVID-19) 07/14/2019  . Malaise and fatigue 07/14/2019  . Chest pain 06/29/2019  . Hypertensive urgency 06/29/2019  . Aftercare including intermittent dialysis ([place]) 05/26/2019  . Inappropriate diet and eating habits 04/30/2019  . Gastro-esophageal reflux disease with esophagitis, without bleeding 04/22/2019  . Pleural effusion, not elsewhere classified 04/22/2019  . Pulmonary hypertension, unspecified ([place]) 04/22/2019  . Palliative care by specialist   . Goals of care, counseling/discussion   . Abscess   . Paroxysmal A-fib ([place])   . Nausea vomiting and diarrhea   . Oropharyngeal dysphagia   . Acute blood loss anemia   . Decubitus ulcer of heel, bilateral, stage 2 ([place]) 02/10/2019  . Sepsis due to methicillin resistant Staphylococcus aureus (MRSA) ([place]) 02/10/2019  . MRSA bacteremia 02/10/2019  . Altered level of consciousness 02/09/2019  . 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 Anemia in chronic kidney disease 01/17/2019  . Coagulation defect, unspecified ([place]) 01/17/2019  . Fever, unspecified 01/17/2019  . Headache, unspecified 01/17/2019  .  Iron deficiency anemia, unspecified 01/17/2019  . Moderate protein-calorie malnutrition ([place]) 01/17/2019  . Other fluid overload 01/17/2019  . Pain, unspecified 01/17/2019  . Pruritus, unspecified 01/17/2019  . Secondary hyperparathyroidism of renal origin ([place]) 01/17/2019  . Shortness of breath 01/17/2019  . Altered mental status, unspecified 09/19/2018  . COVID-19 virus detected  09/19/2018  . Adjustment disorder with depressed mood 08/31/2018  . COVID-19 virus infection 08/24/2018  . Acute on chronic respiratory failure with hypoxia ([place]) 08/24/2018  . Allergic rhinitis 09/19/2017  . Chronic neck pain 07/25/2017  . Hyperkalemia 02/15/2017  . Complication of vascular dialysis catheter, initial encounter 12/01/2016  . ESRD (end stage renal disease) ([place]) 01/19/2016  . Nausea   . GI bleed 10/25/2015  . Hypertensive heart disease 10/24/2015  . Chronic diastolic CHF (congestive heart failure) ([place]) 10/24/2015  . Unstable angina ([place]) 10/23/2015  . Hypotension 09/02/2015  . Chronic systolic CHF (congestive heart failure) ([place]) 09/02/2015  . Depression 07/27/2015  . Calculus of gallbladder with chronic cholecystitis without obstruction   . Bilateral carotid bruits 11/30/2014  . Low magnesium levels 08/10/2013  . Anemia of chronic disease 08/10/2013  . Constipation 08/10/2013  . Atrial fibrillation, chronic ([place]) 07/30/2013  . Coronary artery disease   . CAD (coronary artery disease) 07/02/2013  . Acute systolic heart failure ([place]) 06/30/2013  . NSTEMI (non-ST elevated myocardial infarction) ([place]) 06/29/2013  . Vertigo 08/25/2012  . Sleep disorder 05/23/2012  . Hypothyroid 04/27/2012  . HTN (hypertension) 04/27/2012  . HLD (hyperlipidemia) 04/27/2012  . Type 2 diabetes mellitus with ESRD (end-stage renal disease) ([place]) 04/27/2012  . Neuropathy 04/27/2012    Past Surgical History:  Procedure Laterality Date  . A/V FISTULAGRAM Right 12/17/2016   Procedure: A/V Fistulagram;  Surgeon: Algernon Huxley, MD;  Location: [place] [facility];  Service: Cardiovascular;  Laterality: Right;  . A/V FISTULAGRAM Right 01/07/2017   Procedure: A/V Fistulagram;  Surgeon: Algernon Huxley, MD;  Location: [place] [facility];  Service: Cardiovascular;  Laterality: Right;  . A/V FISTULAGRAM Right 12/03/2017   Procedure: A/V FISTULAGRAM;  Surgeon: Katha Cabal, MD;  Location:  [place] [facility];  Service: Cardiovascular;  Laterality: Right;  . A/V FISTULAGRAM Right 02/23/2019   Procedure: A/V Fistulagram;  Surgeon: Algernon Huxley, MD;  Location: [place] [facility];  Service: Cardiovascular;  Laterality: Right;  . A/V SHUNT INTERVENTION N/A 02/22/2017   Procedure: A/V SHUNT INTERVENTION;  Surgeon: Algernon Huxley, MD;  Location: [place] [facility];  Service: Cardiovascular;  Laterality: N/A;  . AV FISTULA PLACEMENT Right 02/03/2016   Procedure: INSERTION OF ARTERIOVENOUS (AV) GORE-TEX GRAFT ARM ( BRACH / AXILLARY );  Surgeon: Katha Cabal, MD;  Location: [facility];  Service: Vascular;  Laterality: Right;  . AV FISTULA PLACEMENT Left 07/09/2019   Procedure: INSERTION OF ARTERIOVENOUS (AV) GORE-TEX GRAFT ARM ( BRACHIAL AXILLARY );  Surgeon: Algernon Huxley, MD;  Location: [facility];  Service: Vascular;  Laterality: Left;  . [place] REMOVAL Right 03/04/2019   Procedure: REMOVAL OF ARTERIOVENOUS GORETEX GRAFT ([place]);  Surgeon: Algernon Huxley, MD;  Location: [facility];  Service: Vascular;  Laterality: Right;  . CARDIAC CATHETERIZATION    . CATARACT EXTRACTION Bilateral   . CHOLECYSTECTOMY N/A 12/09/2014   Procedure: LAPAROSCOPIC CHOLECYSTECTOMY;  Surgeon: Marlyce [name], MD;  Location: [facility];  Service: General;  Laterality: N/A;  . CORONARY ARTERY BYPASS GRAFT N/A 07/02/2013   Procedure: CORONARY ARTERY BYPASS GRAFTING (CABG);  Surgeon: Ivin [name], MD;  [facility];  Service: Open Heart Surgery;  Laterality: N/A;  CABG x three, using left internal mammary artery and right leg greater saphenous vein harvested endoscopically  . DIALYSIS/PERMA CATHETER REMOVAL N/A 10/13/2019   Procedure: DIALYSIS/PERMA CATHETER REMOVAL;  Surgeon: Algernon Huxley, MD;  Location: [place] [facility];  Service: Cardiovascular;  Laterality: N/A;  . ESOPHAGOGASTRODUODENOSCOPY N/A 04/11/2019   Procedure: ESOPHAGOGASTRODUODENOSCOPY (EGD);  Surgeon: Jonathon Bellows, MD;  Location[facility];   Service: Gastroenterology;  Laterality: N/A;  . ESOPHAGOGASTRODUODENOSCOPY (EGD) WITH PROPOFOL N/A 11/24/2015   Procedure: ESOPHAGOGASTRODUODENOSCOPY (EGD) WITH PROPOFOL;  Surgeon: [name], MD;  Location: [place];  Service: Endoscopy;  Laterality: N/A;  Diabetic - insulin  . ESOPHAGOGASTRODUODENOSCOPY (EGD) WITH PROPOFOL N/A 10/10/2019   Procedure: ESOPHAGOGASTRODUODENOSCOPY (EGD) WITH PROPOFOL;  Surgeon: Virgel Manifold, MD;  Location: [facility];  Service: Endoscopy;  Laterality: N/A;  . EYE SURGERY Bilateral    Cataract Extraction with IOL  . INTRAOPERATIVE TRANSESOPHAGEAL ECHOCARDIOGRAM N/A 07/02/2013   Procedure: INTRAOPERATIVE TRANSESOPHAGEAL ECHOCARDIOGRAM;  Surgeon: Ivin [name], MD;  Location: [place];  Service: Open Heart Surgery;  Laterality: N/A;  . PEG PLACEMENT N/A 03/03/2019   Procedure: PERCUTANEOUS ENDOSCOPIC GASTROSTOMY (PEG) PLACEMENT;  Surgeon: Virgel Manifold, MD;  Location: [facility];  Service: Endoscopy;  Laterality: N/A;  . PERIPHERAL VASCULAR CATHETERIZATION Right 12/06/2015   Procedure: Dialysis/Perma Catheter Insertion;  Surgeon: Katha Cabal, MD;  Location: [place] [facility];  Service: Cardiovascular;  Laterality: Right;  . PERIPHERAL VASCULAR THROMBECTOMY Right 09/28/2016   Procedure: Peripheral Vascular Thrombectomy;  Surgeon: Katha Cabal, MD;  Location: [place] [facility];  Service: Cardiovascular;  Laterality: Right;  . PERIPHERAL VASCULAR THROMBECTOMY Right 05/20/2018   Procedure: PERIPHERAL VASCULAR THROMBECTOMY;  Surgeon: Katha Cabal, MD;  Location: [place] [facility];  Service: Cardiovascular;  Laterality: Right;  . PORTA CATH REMOVAL N/A 06/01/2016   Procedure: Glori [name] Cath Removal;  Surgeon: Katha Cabal, MD;  Location: [place] [facility];  Service: Cardiovascular;  Laterality: N/A;  . THORACENTESIS Left 2015    Prior to Admission medications   Medication Sig Start Date End Date Taking? Authorizing  Provider  acetaminophen (TYLENOL) 325 MG tablet Take 650 mg by mouth daily as needed for moderate pain or headache.     [provider]  atorvastatin (LIPITOR) 40 MG tablet Take 1 tablet (40 mg total) by mouth daily. 08/28/19   [name], MD  FLUoxetine (PROZAC) 20 MG capsule Take 1 capsule by mouth once daily 12/29/19   [name], MD  fluticasone [facility]) 50 MCG/ACT nasal spray Place 2 sprays into both nostrils daily. Patient taking differently: Place 2 sprays into both nostrils daily as needed for allergies or rhinitis.  09/17/17   [name], MD  folic acid (FOLVITE) 1 MG tablet Take 1 tablet (1 mg total) by mouth daily. 04/21/19   Sidney Ace, MD  gabapentin (NEURONTIN) 100 MG capsule Take 100 mg by mouth 3 (three) times daily.    [provider]  hydrALAZINE (APRESOLINE) 10 MG tablet Take 1 tablet (10 mg total) by mouth in the morning and at bedtime. 11/25/19   Marrianne Mood D, PA-C  hydrocortisone cream 1 % Apply topically as needed for itching. 04/20/19   Sidney Ace, MD  levothyroxine (SYNTHROID) 88 MCG tablet Take 1 tablet (88 mcg total) by mouth daily before breakfast. 08/28/19   [name], MD  lidocaine-prilocaine (EMLA) cream Apply 1 application topically as  needed.    [provider]  Melatonin 3 MG TABS Take 3 mg by mouth at bedtime as needed (sleep).     [provider]  multivitamin (RENA-VIT) TABS tablet Take 1 tablet by mouth at bedtime. 09/09/18   Hosie Poisson, MD  nitroGLYCERIN (NITROSTAT) 0.4 MG SL tablet DISSOLVE ONE TABLET UNDER THE TONGUE EVERY 5 MINUTES AS NEEDED FOR CHEST PAIN.  DO NOT EXCEED A TOTAL OF 3 DOSES IN 15 MINUTES Patient taking differently: Place 0.4 mg under the tongue every 5 (five) minutes as needed for chest pain. DISSOLVE ONE TABLET UNDER THE TONGUE EVERY 5 MINUTES AS NEEDED FOR CHEST PAIN.  DO NOT EXCEED A TOTAL OF 3 DOSES IN 15 MINUTES 08/18/18   Wellington Hampshire, MD   Nutritional Supplements (FEEDING SUPPLEMENT, NEPRO CARB STEADY,) LIQD Take 237 mLs by mouth 2 (two) times daily between meals. 10/15/19   [name], MD  omeprazole (PRILOSEC) 20 MG capsule Take 20 mg by mouth daily.    [provider]  ondansetron (ZOFRAN-ODT) 4 MG disintegrating tablet DISSOLVE 1 TABLET IN MOUTH EVERY 8 HOURS AS NEEDED FOR NAUSEA AND VOMITING. PATIENT NEEDS APPOINTMENT WITH PCP TO RECEIVE FURTHER REFILLS 12/15/19   [name], MD  PACERONE 200 MG tablet TAKE 1 TABLET BY MOUTH ONCE DAILY . APPOINTMENT REQUIRED FOR FUTURE REFILLS 12/29/19   [name], MD  traZODone (DESYREL) 50 MG tablet TAKE 1 TABLET BY MOUTH AT BEDTIME (APPOINTMENT  REQUIRED  FOR  FUTURE  REFILLS) 12/29/19   [name], MD  vitamin C (VITAMIN C) 500 MG tablet Take 1 tablet (500 mg total) by mouth daily. 09/10/18   Hosie Poisson, MD    Allergies Nsaids, 5-alpha reductase inhibitors, and Doxycycline  Family History  Problem Relation Age of Onset  . COPD Mother   . Cancer Mother        Lung  . Pulmonary embolism Father   . Diabetes Father   . Diabetes Paternal Grandfather   . Heart disease Maternal Grandmother   . Colon cancer Neg Hx   . Colon polyps Neg Hx   . Esophageal cancer Neg Hx   . Pancreatic cancer Neg Hx   . Liver disease Neg Hx     Social History Social History   Tobacco Use  . Smoking status: Never Smoker  . Smokeless tobacco: Never Used  Vaping Use  . Vaping Use: Never used  Substance Use Topics  . Alcohol use: No    Alcohol/week: 0.0 standard drinks  . Drug use: No    Review of Systems Constitutional: No fever/chills Eyes: No visual changes. ENT: No sore throat. Cardiovascular: Denies chest pain. Respiratory: Denies shortness of breath. Gastrointestinal: No abdominal pain.  No nausea, no vomiting.  No diarrhea.   Genitourinary: Negative for dysuria. Musculoskeletal: Negative for back pain. Skin: Negative for rash. Neurological: Negative  for headaches, focal weakness or numbness.  ____________________________________________   PHYSICAL EXAM:  VITAL SIGNS: ED Triage Vitals [02/02/20 1925]  Enc Vitals Group     BP (!) 170/96     Pulse Rate 64     Resp 18     Temp 98.1 F (36.7 C)     Temp Source Oral     SpO2 97 %     Weight 164 lb 14.5 oz (74.8 kg)     Height 5\' 4"  (1.626 m)     Head Circumference      Peak Flow      Pain  Score 0   Constitutional: Alert and oriented.  Eyes: Conjunctivae are normal.  ENT      Head: Normocephalic and atraumatic.      Nose: No congestion/rhinnorhea.      Mouth/Throat: Mucous membranes are moist.      Neck: No stridor. Hematological/Lymphatic/Immunilogical: No cervical lymphadenopathy. Cardiovascular: Normal rate, regular rhythm.  No murmurs, rubs, or gallops.  Respiratory: Normal respiratory effort without tachypnea nor retractions. Breath sounds are clear and equal bilaterally. No wheezes/rales/rhonchi. Gastrointestinal: Soft and non tender. No rebound. No guarding.  Genitourinary: Deferred Musculoskeletal: Normal range of motion in all extremities. No lower extremity edema. Neurologic:  Slightly slow to respond to questions. Appears to be oriented. Moving all extremities.  Skin:  Skin is warm, dry and intact. No rash noted. Psychiatric: Mood and affect are normal. Speech and behavior are normal. Patient exhibits appropriate insight and judgment.  ____________________________________________    LABS (pertinent positives/negatives)  BMP na 139, k 3.7, glu 67, bun 68, cr 16.46 CBC wbc 6.1, hgb 8.9, plt 138  ____________________________________________   EKG  None  ____________________________________________    RADIOLOGY  None  ____________________________________________   PROCEDURES  Procedures  ____________________________________________   INITIAL IMPRESSION / ASSESSMENT AND PLAN / ED COURSE  Pertinent labs & imaging results that were available  during my care of the patient were reviewed by me and considered in my medical decision making (see chart for details).   Patient presented to the emergency department today because of concerns for missed dialysis and clearance to go back to dialysis.  Patient herself denies any complaints although on exam she is somewhat slow to respond.  Blood work without any concerning hyperkalemia although shows elevated BUN and creatinine level.  Discussed with Dr. Candiss Norse with nephrology.  Plan will be to observe patient overnight for dialysis in the morning.  ____________________________________________   FINAL CLINICAL IMPRESSION(S) / ED DIAGNOSES  Final diagnoses:  ESRD on dialysis [facility])     Note: This dictation was prepared with Dragon dictation. Any transcriptional errors that result from this process are unintentional     Nance Pear, MD 02/02/20 2037    Nance Pear, MD 02/02/20 2107

## 2020-02-03 ENCOUNTER — Other Ambulatory Visit: Payer: Self-pay

## 2020-02-03 DIAGNOSIS — T82838A Hemorrhage of vascular prosthetic devices, implants and grafts, initial encounter: Secondary | ICD-10-CM | POA: Diagnosis present

## 2020-02-03 DIAGNOSIS — K219 Gastro-esophageal reflux disease without esophagitis: Secondary | ICD-10-CM | POA: Diagnosis present

## 2020-02-03 DIAGNOSIS — T8089XA Other complications following infusion, transfusion and therapeutic injection, initial encounter: Secondary | ICD-10-CM | POA: Diagnosis present

## 2020-02-03 DIAGNOSIS — N2581 Secondary hyperparathyroidism of renal origin: Secondary | ICD-10-CM | POA: Diagnosis present

## 2020-02-03 DIAGNOSIS — T829XXD Unspecified complication of cardiac and vascular prosthetic device, implant and graft, subsequent encounter: Secondary | ICD-10-CM | POA: Diagnosis not present

## 2020-02-03 DIAGNOSIS — E1122 Type 2 diabetes mellitus with diabetic chronic kidney disease: Secondary | ICD-10-CM

## 2020-02-03 DIAGNOSIS — D631 Anemia in chronic kidney disease: Secondary | ICD-10-CM

## 2020-02-03 DIAGNOSIS — E1140 Type 2 diabetes mellitus with diabetic neuropathy, unspecified: Secondary | ICD-10-CM | POA: Diagnosis present

## 2020-02-03 DIAGNOSIS — G9349 Other encephalopathy: Secondary | ICD-10-CM | POA: Diagnosis present

## 2020-02-03 DIAGNOSIS — Z20822 Contact with and (suspected) exposure to covid-19: Secondary | ICD-10-CM | POA: Diagnosis present

## 2020-02-03 DIAGNOSIS — I5032 Chronic diastolic (congestive) heart failure: Secondary | ICD-10-CM | POA: Diagnosis present

## 2020-02-03 DIAGNOSIS — N186 End stage renal disease: Secondary | ICD-10-CM | POA: Diagnosis present

## 2020-02-03 DIAGNOSIS — R9082 White matter disease, unspecified: Secondary | ICD-10-CM | POA: Diagnosis not present

## 2020-02-03 DIAGNOSIS — I132 Hypertensive heart and chronic kidney disease with heart failure and with stage 5 chronic kidney disease, or end stage renal disease: Secondary | ICD-10-CM | POA: Diagnosis present

## 2020-02-03 DIAGNOSIS — R4182 Altered mental status, unspecified: Secondary | ICD-10-CM | POA: Diagnosis not present

## 2020-02-03 DIAGNOSIS — E11649 Type 2 diabetes mellitus with hypoglycemia without coma: Secondary | ICD-10-CM | POA: Diagnosis present

## 2020-02-03 DIAGNOSIS — R532 Functional quadriplegia: Secondary | ICD-10-CM | POA: Diagnosis present

## 2020-02-03 DIAGNOSIS — Z992 Dependence on renal dialysis: Secondary | ICD-10-CM | POA: Diagnosis not present

## 2020-02-03 DIAGNOSIS — L89152 Pressure ulcer of sacral region, stage 2: Secondary | ICD-10-CM | POA: Diagnosis present

## 2020-02-03 DIAGNOSIS — Y828 Other medical devices associated with adverse incidents: Secondary | ICD-10-CM | POA: Diagnosis present

## 2020-02-03 DIAGNOSIS — T82898A Other specified complication of vascular prosthetic devices, implants and grafts, initial encounter: Secondary | ICD-10-CM | POA: Diagnosis present

## 2020-02-03 DIAGNOSIS — G9341 Metabolic encephalopathy: Secondary | ICD-10-CM | POA: Diagnosis not present

## 2020-02-03 DIAGNOSIS — Z8616 Personal history of COVID-19: Secondary | ICD-10-CM | POA: Diagnosis not present

## 2020-02-03 DIAGNOSIS — N185 Chronic kidney disease, stage 5: Secondary | ICD-10-CM

## 2020-02-03 DIAGNOSIS — I482 Chronic atrial fibrillation, unspecified: Secondary | ICD-10-CM | POA: Diagnosis present

## 2020-02-03 DIAGNOSIS — I272 Pulmonary hypertension, unspecified: Secondary | ICD-10-CM | POA: Diagnosis present

## 2020-02-03 DIAGNOSIS — Z993 Dependence on wheelchair: Secondary | ICD-10-CM | POA: Diagnosis not present

## 2020-02-03 DIAGNOSIS — E162 Hypoglycemia, unspecified: Secondary | ICD-10-CM

## 2020-02-03 DIAGNOSIS — E785 Hyperlipidemia, unspecified: Secondary | ICD-10-CM | POA: Diagnosis present

## 2020-02-03 DIAGNOSIS — T829XXA Unspecified complication of cardiac and vascular prosthetic device, implant and graft, initial encounter: Secondary | ICD-10-CM | POA: Diagnosis not present

## 2020-02-03 DIAGNOSIS — I255 Ischemic cardiomyopathy: Secondary | ICD-10-CM | POA: Diagnosis present

## 2020-02-03 LAB — CBC
HCT: 25.8 % — ABNORMAL LOW (ref 36.0–46.0)
Hemoglobin: 8.2 g/dL — ABNORMAL LOW (ref 12.0–15.0)
MCH: 29 pg (ref 26.0–34.0)
MCHC: 31.8 g/dL (ref 30.0–36.0)
MCV: 91.2 fL (ref 80.0–100.0)
Platelets: 130 10*3/uL — ABNORMAL LOW (ref 150–400)
RBC: 2.83 MIL/uL — ABNORMAL LOW (ref 3.87–5.11)
RDW: 16.8 % — ABNORMAL HIGH (ref 11.5–15.5)
WBC: 6.3 10*3/uL (ref 4.0–10.5)
nRBC: 0 % (ref 0.0–0.2)

## 2020-02-03 LAB — CBG MONITORING, ED
Glucose-Capillary: 109 mg/dL — ABNORMAL HIGH (ref 70–99)
Glucose-Capillary: 98 mg/dL (ref 70–99)
Glucose-Capillary: 84 mg/dL (ref 70–99)
Glucose-Capillary: 94 mg/dL (ref 70–99)

## 2020-02-03 LAB — BASIC METABOLIC PANEL
Anion gap: 16 — ABNORMAL HIGH (ref 5–15)
BUN: 70 mg/dL — ABNORMAL HIGH (ref 8–23)
CO2: 24 mmol/L (ref 22–32)
Calcium: 8.4 mg/dL — ABNORMAL LOW (ref 8.9–10.3)
Chloride: 96 mmol/L — ABNORMAL LOW (ref 98–111)
Creatinine, Ser: 16.65 mg/dL — ABNORMAL HIGH (ref 0.44–1.00)
GFR, Estimated: 2 mL/min — ABNORMAL LOW (ref >60)
Glucose, Bld: 117 mg/dL — ABNORMAL HIGH (ref 70–99)
Potassium: 3.5 mmol/L (ref 3.5–5.1)
Sodium: 136 mmol/L (ref 135–145)

## 2020-02-03 LAB — GLUCOSE, CAPILLARY
Glucose-Capillary: 89 mg/dL (ref 70–99)
Glucose-Capillary: 107 mg/dL — ABNORMAL HIGH (ref 70–99)
Glucose-Capillary: 96 mg/dL (ref 70–99)

## 2020-02-03 LAB — HEMOGLOBIN A1C
Hgb A1c MFr Bld: 4.6 % — ABNORMAL LOW (ref 4.8–5.6)
Mean Plasma Glucose: 85.32 mg/dL

## 2020-02-03 LAB — MRSA PCR SCREENING: MRSA by PCR: NEGATIVE

## 2020-02-03 MED ORDER — GABAPENTIN 100 MG PO CAPS
100.0000 mg | ORAL_CAPSULE | Freq: Every day | ORAL | Status: DC
  Administered 2020-02-03 – 2020-02-12 (×9): 100 mg via ORAL
  Filled 2020-02-03 (×10): qty 1

## 2020-02-03 MED ORDER — FLUOXETINE HCL 20 MG PO CAPS
20.0000 mg | ORAL_CAPSULE | Freq: Every day | ORAL | Status: DC
  Administered 2020-02-03 – 2020-02-12 (×9): 20 mg via ORAL
  Filled 2020-02-03 (×11): qty 1

## 2020-02-03 MED ORDER — TRAZODONE HCL 50 MG PO TABS
50.0000 mg | ORAL_TABLET | Freq: Every day | ORAL | Status: DC
  Administered 2020-02-03: 50 mg via ORAL
  Filled 2020-02-03: qty 1

## 2020-02-03 MED ORDER — HEPARIN SODIUM (PORCINE) 1000 UNIT/ML DIALYSIS
20.0000 [IU]/kg | INTRAMUSCULAR | Status: DC | PRN
  Filled 2020-02-03: qty 2

## 2020-02-03 MED ORDER — FOLIC ACID 1 MG PO TABS
1.0000 mg | ORAL_TABLET | Freq: Every day | ORAL | Status: DC
  Administered 2020-02-03 – 2020-02-13 (×11): 1 mg via ORAL
  Filled 2020-02-03 (×11): qty 1

## 2020-02-03 MED ORDER — HYDRALAZINE HCL 10 MG PO TABS
10.0000 mg | ORAL_TABLET | Freq: Two times a day (BID) | ORAL | Status: DC
  Administered 2020-02-03 – 2020-02-10 (×10): 10 mg via ORAL
  Filled 2020-02-03 (×21): qty 1

## 2020-02-03 MED ORDER — LIDOCAINE-PRILOCAINE 2.5-2.5 % EX CREA
TOPICAL_CREAM | Freq: Once | CUTANEOUS | Status: DC
  Filled 2020-02-03: qty 5

## 2020-02-03 MED ORDER — CHLORHEXIDINE GLUCONATE CLOTH 2 % EX PADS
6.0000 | MEDICATED_PAD | Freq: Every day | CUTANEOUS | Status: DC
  Administered 2020-02-06 – 2020-02-13 (×8): 6 via TOPICAL
  Filled 2020-02-03: qty 6

## 2020-02-03 MED ORDER — PANTOPRAZOLE SODIUM 40 MG PO TBEC
40.0000 mg | DELAYED_RELEASE_TABLET | Freq: Every day | ORAL | Status: DC
  Administered 2020-02-03 – 2020-02-13 (×11): 40 mg via ORAL
  Filled 2020-02-03 (×11): qty 1

## 2020-02-03 MED ORDER — MELATONIN 5 MG PO TABS: 5.0000 mg | ORAL_TABLET | Freq: Every evening | ORAL | Status: DC | PRN

## 2020-02-03 MED ORDER — EPOETIN ALFA 4000 UNIT/ML IJ SOLN
4000.0000 [IU] | Freq: Once | INTRAMUSCULAR | Status: AC
  Administered 2020-02-05: 4000 [IU] via INTRAVENOUS
  Filled 2020-02-03: qty 1

## 2020-02-03 MED ORDER — FLUTICASONE PROPIONATE 50 MCG/ACT NA SUSP
2.0000 | Freq: Every day | NASAL | Status: DC | PRN
  Filled 2020-02-03: qty 16

## 2020-02-03 MED ORDER — INSULIN ASPART 100 UNIT/ML ~~LOC~~ SOLN: 0.0000 [IU] | Freq: Every day | SUBCUTANEOUS | Status: DC

## 2020-02-03 MED ORDER — AMIODARONE HCL 200 MG PO TABS
100.0000 mg | ORAL_TABLET | Freq: Every day | ORAL | Status: DC
  Administered 2020-02-03 – 2020-02-13 (×10): 100 mg via ORAL
  Filled 2020-02-03 (×10): qty 1

## 2020-02-03 MED ORDER — LEVOTHYROXINE SODIUM 88 MCG PO TABS
88.0000 ug | ORAL_TABLET | Freq: Every day | ORAL | Status: DC
  Administered 2020-02-04 – 2020-02-13 (×8): 88 ug via ORAL
  Filled 2020-02-03 (×10): qty 1

## 2020-02-03 MED ORDER — NEPRO/CARBSTEADY PO LIQD
237.0000 mL | Freq: Two times a day (BID) | ORAL | Status: DC
  Administered 2020-02-03 – 2020-02-13 (×11): 237 mL via ORAL

## 2020-02-03 MED ORDER — ATORVASTATIN CALCIUM 20 MG PO TABS
40.0000 mg | ORAL_TABLET | Freq: Every day | ORAL | Status: DC
  Administered 2020-02-03 – 2020-02-12 (×9): 40 mg via ORAL
  Filled 2020-02-03 (×10): qty 2

## 2020-02-03 MED ORDER — INSULIN ASPART 100 UNIT/ML ~~LOC~~ SOLN
0.0000 [IU] | Freq: Three times a day (TID) | SUBCUTANEOUS | Status: DC
  Administered 2020-02-07: 2 [IU] via SUBCUTANEOUS
  Administered 2020-02-10: 3 [IU] via SUBCUTANEOUS
  Filled 2020-02-03 (×2): qty 1

## 2020-02-03 MED ORDER — HEPARIN SODIUM (PORCINE) 5000 UNIT/ML IJ SOLN
5000.0000 [IU] | Freq: Two times a day (BID) | INTRAMUSCULAR | Status: DC
  Administered 2020-02-03 – 2020-02-06 (×5): 5000 [IU] via SUBCUTANEOUS
  Filled 2020-02-03 (×5): qty 1

## 2020-02-03 NOTE — ED Notes (Signed)
Pt removed Ly[place] when asleep and oxygen saturation decreased to 85%. RN placed Odell back in correct location and pt saturation increased to 95%

## 2020-02-03 NOTE — Progress Notes (Signed)
Patient was unable to dialyze today due to infiltration in the venus of patient's access. Another attempt was made but unsuccessful. RN and doctor aware.

## 2020-02-03 NOTE — ED Notes (Signed)
rn entered pt room at 0434 and noted pulse ox and oxygen tubing not on pt. Pulse ox placed on pt to check oxygen sat and noted to be 86% on room air. [place] placed back onto pt and oxygen put on 2L. Saturation then increased to 96%. Dentures removed and placed into a pink denture cup with pt label placed on top of container. Pt husband at bedside at this time

## 2020-02-03 NOTE — ED Notes (Signed)
This RN contacted pt's husband to inform him of bed.

## 2020-02-03 NOTE — Progress Notes (Addendum)
Progress Note    Jamie Arias  WLS:[id number] DOB: 06/02/1955  DOA: 02/02/2020 PCP: Jamie Haven, MD      Brief Narrative:    Medical records reviewed and are as summarized below:  Jamie Arias is a 64 y.o. female with medical history significant for ESRD on HD MWF with anemia of CKD, HTN, CAD, A. fib, hypothyroidism, depression, chronic diastolic heart failure who was referred to the ED from the dialysis center after having missed 5 dialysis sessions.  Patient is wheelchair and bedbound and total care dependent for ADLs though can feed herself.  Her husband is her caregiver.  She said she missed 5 dialysis sessions because she just did not feel like going to an outpatient dialysis center.  She has been eating less and sleeping more.  She was admitted to the hospital for acute metabolic/uremic encephalopathy likely due to missed hemodialysis sessions.    Assessment/Plan:   Principal Problem:   Acute metabolic encephalopathy Active Problems:   Hypothyroid   HTN (hypertension)   Type 2 diabetes mellitus with ESRD (end-stage renal disease) (HCC)   Coronary artery disease   Atrial fibrillation, chronic (HCC)   Depression   Chronic diastolic CHF (congestive heart failure) (HCC)   ESRD (end stage renal disease) (HCC)   Anemia of chronic renal failure, stage 5 (HCC)   Hypoglycemia   Functional quadriplegia (HCC)    Body mass index is 28.31 kg/m.    Acute metabolic/uremic encephalopathy due to missed hemodialysis: Patient cannot have hemodialysis today because of venous infiltration of hemodialysis access.  Plan for hemodialysis tomorrow.  Follow-up with nephrologist.  Hypertension: Continue antihypertensives  CAD, chronic diastolic CHF, paroxysmal atrial fibrillation: No acute issues.  Patient says she no longer takes Eliquis.  This was confirmed by Anderson Malta, RN.  Continue Lipitor and amiodarone.  Anemia of chronic disease: H&H is stable.  Continue  Epogen.  Type II DM with hypoglycemia on admission: Hemoglobin A1c is 4.6.  Improved.  Monitor glucose levels closely.  Patient is bedbound and wheelchair-bound.  Husband said she has not been able to walk since he had COVID-19 infection in June 2020.   Diet Order            Diet Heart Room service appropriate? Yes; Fluid consistency: Thin  Diet effective now                    Consultants:  Nephrologist  Procedures:  None    Medications:   . amiodarone  100 mg Oral Daily  . atorvastatin  40 mg Oral QHS  . Chlorhexidine Gluconate Cloth  6 each Topical Q0600  . epoetin (EPOGEN/PROCRIT) injection  4,000 Units Intravenous Once  . feeding supplement (NEPRO CARB STEADY)  237 mL Oral BID BM  . FLUoxetine  20 mg Oral QHS  . folic acid  1 mg Oral Daily  . gabapentin  100 mg Oral QHS  . hydrALAZINE  10 mg Oral BID  . insulin aspart  0-15 Units Subcutaneous TID WC  . insulin aspart  0-5 Units Subcutaneous QHS  . [START ON 02/04/2020] levothyroxine  88 mcg Oral Q0600  . lidocaine-prilocaine   Topical Once  . pantoprazole  40 mg Oral Daily  . traZODone  50 mg Oral QHS   Continuous Infusions:   Anti-infectives (From admission, onward)   None             Family Communication/Anticipated D/C date and plan/Code Status   DVT  prophylaxis:      Code Status: Full Code  Family Communication: Plan discussed with her husband at the bedside Disposition Plan:    Status is: Inpatient  Remains inpatient appropriate because:Inpatient level of care appropriate due to severity of illness   Dispo: The patient is from: Home              Anticipated d/c is to: Home              Anticipated d/c date is: 2 days              Patient currently is not medically stable to d/c.                Subjective:   Interval events noted.  Her husband is at the bedside.  He thinks patient's mental status is slightly better today.  C/o generalized  weakness.  Objective:    Vitals:   02/03/20 0930 02/03/20 1000 02/03/20 1200 02/03/20 1317  BP: (!) 161/57 (!) 183/82 (!) 149/89 (!) 169/59  Pulse:  65  60  Resp: 15 15 13 16   Temp:  98.2 F (36.8 C)  98.6 F (37 C)  TempSrc:  Oral  Oral  SpO2:    99%  Weight:      Height:       No data found.  No intake or output data in the 24 hours ending 02/03/20 1622 Filed Weights   02/02/20 1925  Weight: 74.8 kg    Exam:  GEN: NAD SKIN: Warm and dry EYES: EOMI.  No pallor or icterus ENT: MMM CV: RRR PULM: CTA B ABD: soft, ND, NT, +BS CNS: AAO x 3, tremors of b/l upper extremities, chin/jaw tremor EXT: No edema or tenderness   Data Reviewed:   I have personally reviewed following labs and imaging studies:  Labs: Labs show the following:   Basic Metabolic Panel: Recent Labs  Lab 02/02/20 1941 02/03/20 0434  NA 139 136  K 3.7 3.5  CL 98 96*  CO2 22 24  GLUCOSE 67* 117*  BUN 68* 70*  CREATININE 16.46* 16.65*  CALCIUM 8.5* 8.4*   GFR Estimated Creatinine Clearance: 3.3 mL/min (A) (by C-G formula based on SCr of 16.65 mg/dL (H)). Liver Function Tests: No results for input(s): AST, ALT, ALKPHOS, BILITOT, PROT, ALBUMIN in the last 168 hours. No results for input(s): LIPASE, AMYLASE in the last 168 hours. No results for input(s): AMMONIA in the last 168 hours. Coagulation profile No results for input(s): INR, PROTIME in the last 168 hours.  CBC: Recent Labs  Lab 02/02/20 1941 02/03/20 0434  WBC 6.1 6.3  NEUTROABS 5.0  --   HGB 8.9* 8.2*  HCT 27.6* 25.8*  MCV 90.8 91.2  PLT 138* 130*   Cardiac Enzymes: No results for input(s): CKTOTAL, CKMB, CKMBINDEX, TROPONINI in the last 168 hours. BNP (last 3 results) No results for input(s): PROBNP in the last 8760 hours. CBG: Recent Labs  Lab 02/03/20 0546 02/03/20 0852 02/03/20 1245 02/03/20 1347 02/03/20 1619  GLUCAP 98 84 94 89 107*   D-Dimer: No results for input(s): DDIMER in the last 72  hours. Hgb A1c: Recent Labs    02/03/20 0434  HGBA1C 4.6*   Lipid Profile: No results for input(s): CHOL, HDL, LDLCALC, TRIG, CHOLHDL, LDLDIRECT in the last 72 hours. Thyroid function studies: No results for input(s): TSH, T4TOTAL, T3FREE, THYROIDAB in the last 72 hours.  Invalid input(s): FREET3 Anemia work up: No results for input(s): VITAMINB12, FOLATE,  FERRITIN, TIBC, IRON, RETICCTPCT in the last 72 hours. Sepsis Labs: Recent Labs  Lab 02/02/20 1941 02/03/20 0434  WBC 6.1 6.3    Microbiology Recent Results (from the past 240 hour(s))  Resp Panel by RT-PCR (Flu A&B, Covid) Nasopharyngeal Swab     Status: None   Collection Time: 02/02/20  7:36 PM   Specimen: Nasopharyngeal Swab; Nasopharyngeal(NP) swabs in vial transport medium  Result Value Ref Range Status   SARS Coronavirus 2 by RT PCR NEGATIVE NEGATIVE Final    Comment: (NOTE) SARS-CoV-2 target nucleic acids are NOT DETECTED.  The SARS-CoV-2 RNA is generally detectable in upper respiratory specimens during the acute phase of infection. The lowest concentration of SARS-CoV-2 viral copies this assay can detect is 138 copies/mL. A negative result does not preclude SARS-Cov-2 infection and should not be used as the sole basis for treatment or other patient management decisions. A negative result may occur with  improper specimen collection/handling, submission of specimen other than nasopharyngeal swab, presence of viral mutation(s) within the areas targeted by this assay, and inadequate number of viral copies(<138 copies/mL). A negative result must be combined with clinical observations, patient history, and epidemiological information. The expected result is Negative.  Fact Sheet for Patients:  EntrepreneurPulse.com.au  Fact Sheet for Healthcare Providers:  IncredibleEmployment.be  This test is no t yet approved or cleared by the [place] FDA and  has been authorized for  detection and/or diagnosis of SARS-CoV-2 by FDA under an Emergency Use Authorization (EUA). This EUA will remain  in effect (meaning this test can be used) for the duration of the COVID-19 declaration under Section 564(b)(1) of the Act, 21 U.S.C.section 360bbb-3(b)(1), unless the authorization is terminated  or revoked sooner.       Influenza A by PCR NEGATIVE NEGATIVE Final   Influenza B by PCR NEGATIVE NEGATIVE Final    Comment: (NOTE) The Xpert Xpress SARS-CoV-2/FLU/RSV plus assay is intended as an aid in the diagnosis of influenza from Nasopharyngeal swab specimens and should not be used as a sole basis for treatment. Nasal washings and aspirates are unacceptable for Xpert Xpress SARS-CoV-2/FLU/RSV testing.  Fact Sheet for Patients: EntrepreneurPulse.com.au  Fact Sheet for Healthcare Providers: IncredibleEmployment.be  This test is not yet approved or cleared by the [place] FDA and has been authorized for detection and/or diagnosis of SARS-CoV-2 by FDA under an Emergency Use Authorization (EUA). This EUA will remain in effect (meaning this test can be used) for the duration of the COVID-19 declaration under Section 564(b)(1) of the Act, 21 U.S.C. section 360bbb-3(b)(1), unless the authorization is terminated or revoked.  Performed at [facility], [address] [place], [place] [address]     Procedures and diagnostic studies:  DG Chest [facility] 1 View  Result Date: 02/02/2020 CLINICAL DATA:  End-stage renal disease, uremia EXAM: PORTABLE CHEST 1 VIEW COMPARISON:  10/08/2019 FINDINGS: The lungs are symmetrically expanded. Trace interstitial pulmonary edema has developed, new since prior examination. No pneumothorax or pleural effusion. Stable left pleural thickening. Coronary artery bypass grafting has been performed. Cardiac size is at the upper limits of normal. No acute bone abnormality. IMPRESSION: Interval development  of trace perihilar interstitial pulmonary edema. Electronically Signed   By: Fidela Salisbury MD   On: 02/02/2020 22:11               LOS: 0 days   Stana Bayon  [facility]   Pager on [email or web address]. If 7PM-7AM, please contact night-coverage at [email or web address]  02/03/2020, 4:22 PM

## 2020-02-03 NOTE — Progress Notes (Signed)
Pt's heart rate went down to 47. HR currently 57-60. Pt. seems very sleepy and answering some questions.Telemetry ready apnea on machine but goes back to NSR. Will continue to monitor. MD notified.    Jamie Arias

## 2020-02-03 NOTE — Progress Notes (Addendum)
[facility], [place] 02/03/20  Subjective:   LOS: 0  Missed 5 treatments of dialysis Couple of times patient had diarrhea and did not want to go to dialysis Other times - Patient states she just did not want to go to dialysis Last HD was nov 12  Objective:  Vital signs in last 24 hours:  Temp:  [98.1 F (36.7 C)-98.5 F (36.9 C)] 98.5 F (36.9 C) (12/01 0103) Pulse Rate:  [59-67] 59 (12/01 0830) Resp:  [12-19] 14 (12/01 0830) BP: (144-184)/(51-96) 172/57 (12/01 0830) SpO2:  [86 %-100 %] 100 % (12/01 0830) Weight:  [74.8 kg] 74.8 kg (11/30 1925)  Weight change:  Filed Weights   02/02/20 1925  Weight: 74.8 kg    Intake/Output:   No intake or output data in the 24 hours ending 02/03/20 0859  Physical Exam: General:  No acute distress, laying in the bed  HEENT  anicteric, moist oral mucous membrane  Pulm/lungs  normal breathing effort, lungs are clear to auscultation  CVS/Heart  regular rhythm, no rub or gallop  Abdomen:   Soft, nontender  Extremities:  No peripheral edema  Neurologic:  Alert, oriented, able to follow commands, tremors in hands  Skin:  No acute rashes  Left arm AVG      Basic Metabolic Panel:  Recent Labs  Lab 02/02/20 1941 02/03/20 0434  NA 139 136  K 3.7 3.5  CL 98 96*  CO2 22 24  GLUCOSE 67* 117*  BUN 68* 70*  CREATININE 16.46* 16.65*  CALCIUM 8.5* 8.4*     CBC: Recent Labs  Lab 02/02/20 1941 02/03/20 0434  WBC 6.1 6.3  NEUTROABS 5.0  --   HGB 8.9* 8.2*  HCT 27.6* 25.8*  MCV 90.8 91.2  PLT 138* 130*      Lab Results  Component Value Date   HEPBSAG NON REACTIVE 10/08/2019      Microbiology:  Recent Results (from the past 240 hour(s))  Resp Panel by RT-PCR (Flu A&B, Covid) Nasopharyngeal Swab     Status: None   Collection Time: 02/02/20  7:36 PM   Specimen: Nasopharyngeal Swab; Nasopharyngeal(NP) swabs in vial transport medium  Result Value Ref Range Status   SARS Coronavirus 2 by RT  PCR NEGATIVE NEGATIVE Final    Comment: (NOTE) SARS-CoV-2 target nucleic acids are NOT DETECTED.  The SARS-CoV-2 RNA is generally detectable in upper respiratory specimens during the acute phase of infection. The lowest concentration of SARS-CoV-2 viral copies this assay can detect is 138 copies/mL. A negative result does not preclude SARS-Cov-2 infection and should not be used as the sole basis for treatment or other patient management decisions. A negative result may occur with  improper specimen collection/handling, submission of specimen other than nasopharyngeal swab, presence of viral mutation(s) within the areas targeted by this assay, and inadequate number of viral copies(<138 copies/mL). A negative result must be combined with clinical observations, patient history, and epidemiological information. The expected result is Negative.  Fact Sheet for Patients:  EntrepreneurPulse.com.au  Fact Sheet for Healthcare Providers:  IncredibleEmployment.be  This test is no t yet approved or cleared by the [place] FDA and  has been authorized for detection and/or diagnosis of SARS-CoV-2 by FDA under an Emergency Use Authorization (EUA). This EUA will remain  in effect (meaning this test can be used) for the duration of the COVID-19 declaration under Section 564(b)(1) of the Act, 21 U.S.C.section 360bbb-3(b)(1), unless the authorization is terminated  or revoked sooner.  Influenza A by PCR NEGATIVE NEGATIVE Final   Influenza B by PCR NEGATIVE NEGATIVE Final    Comment: (NOTE) The Xpert Xpress SARS-CoV-2/FLU/RSV plus assay is intended as an aid in the diagnosis of influenza from Nasopharyngeal swab specimens and should not be used as a sole basis for treatment. Nasal washings and aspirates are unacceptable for Xpert Xpress SARS-CoV-2/FLU/RSV testing.  Fact Sheet for Patients: EntrepreneurPulse.com.au  Fact Sheet for  Healthcare Providers: IncredibleEmployment.be  This test is not yet approved or cleared by the [place] FDA and has been authorized for detection and/or diagnosis of SARS-CoV-2 by FDA under an Emergency Use Authorization (EUA). This EUA will remain in effect (meaning this test can be used) for the duration of the COVID-19 declaration under Section 564(b)(1) of the Act, 21 U.S.C. section 360bbb-3(b)(1), unless the authorization is terminated or revoked.  Performed at [facility], [address] [place], [place] [address]     Coagulation Studies: No results for input(s): LABPROT, INR in the last 72 hours.  Urinalysis: No results for input(s): COLORURINE, LABSPEC, PHURINE, GLUCOSEU, HGBUR, BILIRUBINUR, KETONESUR, PROTEINUR, UROBILINOGEN, NITRITE, LEUKOCYTESUR in the last 72 hours.  Invalid input(s): APPERANCEUR    Imaging: DG Chest Port 1 View  Result Date: 02/02/2020 CLINICAL DATA:  End-stage renal disease, uremia EXAM: PORTABLE CHEST 1 VIEW COMPARISON:  10/08/2019 FINDINGS: The lungs are symmetrically expanded. Trace interstitial pulmonary edema has developed, new since prior examination. No pneumothorax or pleural effusion. Stable left pleural thickening. Coronary artery bypass grafting has been performed. Cardiac size is at the upper limits of normal. No acute bone abnormality. IMPRESSION: Interval development of trace perihilar interstitial pulmonary edema. Electronically Signed   By: Fidela Salisbury MD   On: 02/02/2020 22:11     Medications:    . apixaban  5 mg Oral BID  . Chlorhexidine Gluconate Cloth  6 each Topical Q0600  . insulin aspart  0-15 Units Subcutaneous TID WC  . insulin aspart  0-5 Units Subcutaneous QHS   acetaminophen **OR** acetaminophen, heparin, ondansetron **OR** ondansetron (ZOFRAN) IV  Assessment/ Plan:  64 y.o. female with  was admitted on 02/02/2020 for  Principal Problem:   Acute metabolic  encephalopathy Active Problems:   Hypothyroid   HTN (hypertension)   Type 2 diabetes mellitus with ESRD (end-stage renal disease) (HCC)   Coronary artery disease   Atrial fibrillation, chronic (HCC)   Depression   Chronic diastolic CHF (congestive heart failure) (HCC)   ESRD (end stage renal disease) (HCC)   Anemia of chronic renal failure, stage 5 (HCC)   Hypoglycemia   Functional quadriplegia ([place])  AMS (altered mental status) [R41.82]  #. Uremia in setting of ESRD Last hemodialysis treatment was November 12.  Patient does have some mild symptoms of uremia.  Appetite is low.  No nausea or vomiting.  She does have diarrhea occasional loose stools.  Imodium helps.  We will arrange her dialysis today; patient wants to look into home peritoneal dialysis and we will arrange that for her as outpatient;  # Complication of Dialysis access Infiltration of access during HD Patient did not get treatment. Prolonged bleeding post cannulation - Rest access today Evaluate tomorrow  #. Anemia of CKD  Lab Results  Component Value Date   HGB 8.2 (L) 02/03/2020   Low dose EPO with HD  #. Secondary hyperparathyroidism of renal origin N 25.81      Component Value Date/Time   PTH 83 (H) 10/09/2019 0847   Lab Results  Component Value Date  PHOS 1.8 (L) 10/13/2019   Monitor calcium and phos level during this admission   #. Diabetes type 2 with CKD Hemoglobin A1C (no units)  Date Value  08/16/2017 7.1   Hgb A1c MFr Bld (%)  Date Value  09/09/2019 6.1 (H)     LOS: [place] 12/1/20218:59 [place], [place]

## 2020-02-03 NOTE — ED Notes (Signed)
Pt returned from dialysis with left fistula wrapped in gauze and tape. Dialysis treatment unsuccessful due to fistula infiltration. Dialysis treatment scheduled for tomorrow per dialysis RN.

## 2020-02-03 NOTE — Progress Notes (Addendum)
Patient unable to dialyze today due to venous infiltration x 2 per NT cannulation of new access. Dr. Candiss Norse at bedside with 1st cannulation and message the second cannulation and made aware of infiltration. Dr. Candiss Norse new orders to allow access to rest today.  Karena Addison, RN in ED made aware of this situation as well.

## 2020-02-04 ENCOUNTER — Other Ambulatory Visit (INDEPENDENT_AMBULATORY_CARE_PROVIDER_SITE_OTHER): Payer: Self-pay | Admitting: Vascular Surgery

## 2020-02-04 ENCOUNTER — Inpatient Hospital Stay
Admission: EM | Disposition: A | Discharge status: Home / Self Care | Payer: Self-pay | Source: Home / Self Care | Attending: Internal Medicine

## 2020-02-04 DIAGNOSIS — R4182 Altered mental status, unspecified: Secondary | ICD-10-CM

## 2020-02-04 DIAGNOSIS — Z992 Dependence on renal dialysis: Secondary | ICD-10-CM

## 2020-02-04 DIAGNOSIS — N186 End stage renal disease: Secondary | ICD-10-CM

## 2020-02-04 DIAGNOSIS — T82898A Other specified complication of vascular prosthetic devices, implants and grafts, initial encounter: Secondary | ICD-10-CM | POA: Diagnosis not present

## 2020-02-04 HISTORY — PX: A/V SHUNTOGRAM: CATH118297

## 2020-02-04 LAB — GLUCOSE, CAPILLARY
Glucose-Capillary: 86 mg/dL (ref 70–99)
Glucose-Capillary: 85 mg/dL (ref 70–99)
Glucose-Capillary: 74 mg/dL (ref 70–99)
Glucose-Capillary: 77 mg/dL (ref 70–99)
Glucose-Capillary: 79 mg/dL (ref 70–99)

## 2020-02-04 LAB — POTASSIUM: Potassium: 3.7 mmol/L (ref 3.5–5.1)

## 2020-02-04 SURGERY — A/V SHUNTOGRAM
Anesthesia: Moderate Sedation | Laterality: Left

## 2020-02-04 MED ORDER — FENTANYL CITRATE (PF) 100 MCG/2ML IJ SOLN
INTRAMUSCULAR | Status: AC
  Filled 2020-02-04: qty 2

## 2020-02-04 MED ORDER — CEFAZOLIN SODIUM-DEXTROSE 1-4 GM/50ML-% IV SOLN: 1.0000 g | Freq: Once | INTRAVENOUS | Status: DC

## 2020-02-04 MED ORDER — MIDAZOLAM HCL 2 MG/2ML IJ SOLN
INTRAMUSCULAR | Status: DC | PRN
  Administered 2020-02-04: 1 mg via INTRAVENOUS

## 2020-02-04 MED ORDER — DIPHENHYDRAMINE HCL 50 MG/ML IJ SOLN: 50.0000 mg | Freq: Once | INTRAMUSCULAR | Status: DC | PRN

## 2020-02-04 MED ORDER — MIDAZOLAM HCL 2 MG/2ML IJ SOLN
INTRAMUSCULAR | Status: AC
  Filled 2020-02-04: qty 2

## 2020-02-04 MED ORDER — MIDAZOLAM HCL 2 MG/ML PO SYRP: 8.0000 mg | ORAL_SOLUTION | Freq: Once | ORAL | Status: DC | PRN

## 2020-02-04 MED ORDER — SODIUM CHLORIDE 0.9 % IV SOLN: INTRAVENOUS | Status: DC

## 2020-02-04 MED ORDER — METHYLPREDNISOLONE SODIUM SUCC 125 MG IJ SOLR: 125.0000 mg | Freq: Once | INTRAMUSCULAR | Status: DC | PRN

## 2020-02-04 MED ORDER — IODIXANOL 320 MG/ML IV SOLN
INTRAVENOUS | Status: DC | PRN
  Administered 2020-02-04: 35 mL

## 2020-02-04 MED ORDER — FAMOTIDINE 20 MG PO TABS: 40.0000 mg | ORAL_TABLET | Freq: Once | ORAL | Status: DC | PRN

## 2020-02-04 SURGICAL SUPPLY — 17 items
BALLN DORADO 7X40X80 (BALLOONS) ×3
BALLN LUTONIX DCB 7X60X130 (BALLOONS) ×3
BALLOON DORADO 7X40X80 (BALLOONS) ×1 IMPLANT
BALLOON LUTONIX DCB 7X60X130 (BALLOONS) ×1 IMPLANT
CANNULA 5F STIFF (CANNULA) ×3 IMPLANT
DRAPE BRACHIAL (DRAPES) ×3 IMPLANT
GLIDEWIRE ADV .035X180CM (WIRE) ×3 IMPLANT
KIT ENCORE 26 ADVANTAGE (KITS) ×3 IMPLANT
PACK ANGIOGRAPHY (CUSTOM PROCEDURE TRAY) ×3 IMPLANT
SHEATH BRITE TIP 6FRX5.5 (SHEATH) ×3 IMPLANT
SHEATH BRITE TIP 7FRX5.5 (SHEATH) ×3 IMPLANT
STENT VIABAHN 8X50X120 (Permanent Stent) ×3 IMPLANT
STENT VIABAHN5X120X8X (Permanent Stent) ×1 IMPLANT
SUT MNCRL 4-0 (SUTURE) ×2
SUT MNCRL 4-0 27XMFL (SUTURE) ×1
SUTURE MNCRL 4-0 27XMF (SUTURE) ×1 IMPLANT
WIRE G 018X200 V18 (WIRE) ×3 IMPLANT

## 2020-02-04 NOTE — Progress Notes (Signed)
[facility]) Hospital Liaison RN note:  This patient is currently followed by out patient based palliative care with [facility]. Dayton Scrape, TOC is aware. Plan will be to discharge back home when stable. Will follow for disposition.   Thank you.  Zandra Abts, RN [facility] Liaison [phone]

## 2020-02-04 NOTE — Progress Notes (Signed)
[facility], [place] 02/04/20  Subjective:   LOS: 1  Missed 5 treatments of dialysis Couple of times patient had diarrhea and did not want to go to dialysis Other times - Patient states she just did not want to go to dialysis Last HD was nov 12 Access infiltrated yesterday. Dialysis had to be deferred in order to rest that access Lethargic today; did not eat much  Objective:  Vital signs in last 24 hours:  Temp:  [97.6 F (36.4 C)-98.9 F (37.2 C)] 98.9 F (37.2 C) (12/02 1133) Pulse Rate:  [56-64] 64 (12/02 1133) Resp:  [16-19] 19 (12/02 1133) BP: (127-169)/(49-59) 159/49 (12/02 1133) SpO2:  [92 %-100 %] 100 % (12/02 1133)  Weight change:  Filed Weights   02/02/20 1925  Weight: 74.8 kg    Intake/Output:    Intake/Output Summary (Last 24 hours) at 02/04/2020 1207 Last data filed at 02/04/2020 0842 Gross per 24 hour  Intake --  Output 50 ml  Net -50 ml    Physical Exam: General:  No acute distress, laying in the bed  HEENT  anicteric, moist oral mucous membrane  Pulm/lungs  normal breathing effort, lungs are clear to auscultation  CVS/Heart  regular rhythm, no rub or gallop  Abdomen:   Soft, nontender  Extremities:  No peripheral edema  Neurologic: lethargic, able to follow commands,   Skin:  No acute rashes  Left arm AVG      Basic Metabolic Panel:  Recent Labs  Lab 02/02/20 1941 02/03/20 0434  NA 139 136  K 3.7 3.5  CL 98 96*  CO2 22 24  GLUCOSE 67* 117*  BUN 68* 70*  CREATININE 16.46* 16.65*  CALCIUM 8.5* 8.4*     CBC: Recent Labs  Lab 02/02/20 1941 02/03/20 0434  WBC 6.1 6.3  NEUTROABS 5.0  --   HGB 8.9* 8.2*  HCT 27.6* 25.8*  MCV 90.8 91.2  PLT 138* 130*      Lab Results  Component Value Date   HEPBSAG NON REACTIVE 10/08/2019      Microbiology:  Recent Results (from the past 240 hour(s))  Resp Panel by RT-PCR (Flu A&B, Covid) Nasopharyngeal Swab     Status: None   Collection Time: 02/02/20   7:36 PM   Specimen: Nasopharyngeal Swab; Nasopharyngeal(NP) swabs in vial transport medium  Result Value Ref Range Status   SARS Coronavirus 2 by RT PCR NEGATIVE NEGATIVE Final    Comment: (NOTE) SARS-CoV-2 target nucleic acids are NOT DETECTED.  The SARS-CoV-2 RNA is generally detectable in upper respiratory specimens during the acute phase of infection. The lowest concentration of SARS-CoV-2 viral copies this assay can detect is 138 copies/mL. A negative result does not preclude SARS-Cov-2 infection and should not be used as the sole basis for treatment or other patient management decisions. A negative result may occur with  improper specimen collection/handling, submission of specimen other than nasopharyngeal swab, presence of viral mutation(s) within the areas targeted by this assay, and inadequate number of viral copies(<138 copies/mL). A negative result must be combined with clinical observations, patient history, and epidemiological information. The expected result is Negative.  Fact Sheet for Patients:  EntrepreneurPulse.com.au  Fact Sheet for Healthcare Providers:  IncredibleEmployment.be  This test is no t yet approved or cleared by the [place] FDA and  has been authorized for detection and/or diagnosis of SARS-CoV-2 by FDA under an Emergency Use Authorization (EUA). This EUA will remain  in effect (meaning this test can be  used) for the duration of the COVID-19 declaration under Section 564(b)(1) of the Act, 21 U.S.C.section 360bbb-3(b)(1), unless the authorization is terminated  or revoked sooner.       Influenza A by PCR NEGATIVE NEGATIVE Final   Influenza B by PCR NEGATIVE NEGATIVE Final    Comment: (NOTE) The Xpert Xpress SARS-CoV-2/FLU/RSV plus assay is intended as an aid in the diagnosis of influenza from Nasopharyngeal swab specimens and should not be used as a sole basis for treatment. Nasal washings and aspirates  are unacceptable for Xpert Xpress SARS-CoV-2/FLU/RSV testing.  Fact Sheet for Patients: EntrepreneurPulse.com.au  Fact Sheet for Healthcare Providers: IncredibleEmployment.be  This test is not yet approved or cleared by the [place] FDA and has been authorized for detection and/or diagnosis of SARS-CoV-2 by FDA under an Emergency Use Authorization (EUA). This EUA will remain in effect (meaning this test can be used) for the duration of the COVID-19 declaration under Section 564(b)(1) of the Act, 21 U.S.C. section 360bbb-3(b)(1), unless the authorization is terminated or revoked.  Performed at [facility], [address] [place], [place] [address]   MRSA PCR Screening     Status: None   Collection Time: 02/03/20  1:45 PM   Specimen: Nasopharyngeal  Result Value Ref Range Status   MRSA by PCR NEGATIVE NEGATIVE Final    Comment:        The GeneXpert MRSA Assay (FDA approved for NASAL specimens only), is one component of a comprehensive MRSA colonization surveillance program. It is not intended to diagnose MRSA infection nor to guide or monitor treatment for MRSA infections. Performed at [facility], [address] [place],  [address]     Coagulation Studies: No results for input(s): LABPROT, INR in the last 72 hours.  Urinalysis: No results for input(s): COLORURINE, LABSPEC, PHURINE, GLUCOSEU, HGBUR, BILIRUBINUR, KETONESUR, PROTEINUR, UROBILINOGEN, NITRITE, LEUKOCYTESUR in the last 72 hours.  Invalid input(s): APPERANCEUR    Imaging: DG Chest Port 1 View  Result Date: 02/02/2020 CLINICAL DATA:  End-stage renal disease, uremia EXAM: PORTABLE CHEST 1 VIEW COMPARISON:  10/08/2019 FINDINGS: The lungs are symmetrically expanded. Trace interstitial pulmonary edema has developed, new since prior examination. No pneumothorax or pleural effusion. Stable left pleural thickening. Coronary artery bypass  grafting has been performed. Cardiac size is at the upper limits of normal. No acute bone abnormality. IMPRESSION: Interval development of trace perihilar interstitial pulmonary edema. Electronically Signed   By: Fidela Salisbury MD   On: 02/02/2020 22:11     Medications:    . amiodarone  100 mg Oral Daily  . atorvastatin  40 mg Oral QHS  . Chlorhexidine Gluconate Cloth  6 each Topical Q0600  . epoetin (EPOGEN/PROCRIT) injection  4,000 Units Intravenous Once  . feeding supplement (NEPRO CARB STEADY)  237 mL Oral BID BM  . FLUoxetine  20 mg Oral QHS  . folic acid  1 mg Oral Daily  . gabapentin  100 mg Oral QHS  . heparin injection (subcutaneous)  5,000 Units Subcutaneous Q12H  . hydrALAZINE  10 mg Oral BID  . insulin aspart  0-15 Units Subcutaneous TID WC  . insulin aspart  0-5 Units Subcutaneous QHS  . levothyroxine  88 mcg Oral Q0600  . lidocaine-prilocaine   Topical Once  . pantoprazole  40 mg Oral Daily  . traZODone  50 mg Oral QHS   acetaminophen **OR** acetaminophen, fluticasone, heparin, melatonin, ondansetron **OR** ondansetron (ZOFRAN) IV  Assessment/ Plan:  64 y.o. female with  was admitted  on 02/02/2020 for  Principal Problem:   Acute metabolic encephalopathy Active Problems:   Hypothyroid   HTN (hypertension)   Type 2 diabetes mellitus with ESRD (end-stage renal disease) (HCC)   Coronary artery disease   Atrial fibrillation, chronic (HCC)   Depression   Chronic diastolic CHF (congestive heart failure) (HCC)   ESRD (end stage renal disease) (HCC)   Anemia of chronic renal failure, stage 5 (HCC)   Hypoglycemia   Functional quadriplegia (HCC)  ESRD on dialysis ([place]) [N18.6, Z99.2] AMS (altered mental status) [K10.31] Acute metabolic encephalopathy [Y81.18]  #. Uremia in setting of ESRD Last hemodialysis treatment was November 12.  Patient does have some mild symptoms of uremia.  Appetite is low.  No nausea or vomiting.  She does have diarrhea occasional loose  stools.  Imodium helps.   We will arrange her dialysis today;    # Complication of Dialysis access Infiltration of access during HD Patient did not get treatment. Prolonged bleeding post cannulation - Rest access today Trial of dialysis today  #. Anemia of CKD  Lab Results  Component Value Date   HGB 8.2 (L) 02/03/2020   Low dose EPO with HD  #. Secondary hyperparathyroidism of renal origin N 25.81      Component Value Date/Time   PTH 83 (H) 10/09/2019 0847   Lab Results  Component Value Date   PHOS 1.8 (L) 10/13/2019   Monitor calcium and phos level during this admission   #. Diabetes type 2 with CKD Hemoglobin A1C (no units)  Date Value  08/16/2017 7.1   Hgb A1c MFr Bld (%)  Date Value  02/03/2020 4.6 (L)     LOS: [place] 12/2/202112:07 PM  [facility] [place], [place]

## 2020-02-04 NOTE — Progress Notes (Signed)
Patient to specials for revision of LUE fistula. Reported bleeding and unable to access properly by HD. Dressing intact LUE upon arrival to specials. Patient is lethargic but does answer simple yes/no answers in specials, knows who she is and where she is.  Afebrile, blood sugar 74, vascular team aware. Lungs diminished thru out fields. SB-SR on monitor

## 2020-02-04 NOTE — TOC Initial Note (Signed)
Transition of Care [facility]) - Initial/Assessment Note    Patient Details  Name: Jamie Arias MRN: [id number] Date of Birth: 02-Apr-1955  [facility]) CM/SW Contact:    Candie Chroman, LCSW Phone Number: 02/04/2020, 2:55 PM  Clinical Narrative: Readmission prevention screen started. Patient left room for HD. Husband in room. CSW introduced role and explained that discharge planning would be discussed. PCP is [name], MD. Patient active with [facility] for home health PT. Notified their representative that she was here. Patient has all needed DME at home already. She will need EMS transport home when discharged. Husband confirmed address on facesheet is correct. No further concerns. CSW encouraged patient's husband to contact CSW as needed. CSW will continue to follow patient for support and facilitate return home when stable.                 Expected Discharge Plan: [place] Barriers to Discharge: Continued Medical Work up   Patient Goals and CMS Choice Patient states their goals for this hospitalization and ongoing recovery are:: Patient not fully oriented.      Expected Discharge Plan and Services Expected Discharge Plan: No Name Choice: Resumption of Svcs/PTA Provider Living arrangements for the past 2 months: Single Family Home                                      Prior Living Arrangements/Services Living arrangements for the past 2 months: Single Family Home Lives with:: Spouse Patient language and need for interpreter reviewed:: Yes Do you feel safe going back to the place where you live?: Yes      Need for Family Participation in Patient Care: Yes (Comment) Care giver support system in place?: Yes (comment) Current home services: DME, Home PT Criminal Activity/Legal Involvement Pertinent to Current Situation/Hospitalization: No - Comment as needed  Activities of Daily Living Home  Assistive Devices/Equipment: Wheelchair, Environmental consultant (specify type), Oxygen ADL Screening (condition at time of admission) Patient's cognitive ability adequate to safely complete daily activities?: No Is the patient deaf or have difficulty hearing?: No Does the patient have difficulty seeing, even when wearing glasses/contacts?: No Does the patient have difficulty concentrating, remembering, or making decisions?: Yes Patient able to express need for assistance with ADLs?: Yes Does the patient have difficulty dressing or bathing?: Yes Independently performs ADLs?: No Communication: Independent Dressing (OT): Needs assistance Is this a change from baseline?: Pre-admission baseline Grooming: Needs assistance Is this a change from baseline?: Pre-admission baseline Feeding: Needs assistance Is this a change from baseline?: Pre-admission baseline Bathing: Needs assistance Is this a change from baseline?: Pre-admission baseline Toileting: Needs assistance Is this a change from baseline?: Pre-admission baseline In/Out Bed: Needs assistance Is this a change from baseline?: Pre-admission baseline Walks in Home: Needs assistance Is this a change from baseline?: Pre-admission baseline Does the patient have difficulty walking or climbing stairs?: Yes Weakness of Legs: Both Weakness of Arms/Hands: Both  Permission Sought/Granted Permission sought to share information with : Facility Sport and exercise psychologist    Share Information with NAME: Randie Bloodgood  Permission granted to share info w AGENCY: [facility]  Permission granted to share info w Relationship: Spouse  Permission granted to share info w Contact Information: [phone]  Emotional Assessment Appearance:: Appears stated age Attitude/Demeanor/Rapport: Engaged, Gracious Affect (typically observed): Accepting, Appropriate, Calm, Pleasant Orientation: : Oriented  to Self, Oriented to Place, Oriented to  Time, Oriented to Situation Alcohol /  Substance Use: Not Applicable Psych Involvement: No (comment)  Admission diagnosis:  ESRD on dialysis ([place]) [N18.6, Z99.2] AMS (altered mental status) [K09.38] Acute metabolic encephalopathy [H82.99] Patient Active Problem List   Diagnosis Date Noted  . Hypoglycemia 02/02/2020  . Functional quadriplegia ([place]) 02/02/2020  . Tremor 11/03/2019  . DNR (do not resuscitate) discussion   . Unresponsive episode   . Acute cystitis without hematuria   . Epigastric abdominal pain   . Abdominal pain   . Lethargy   . Mobility impaired 09/23/2019  . Unspecified complication of cardiac and vascular prosthetic device, implant and graft, initial encounter 09/14/2019  . Complication of vascular dialysis catheter 09/09/2019  . History of 2019 novel coronavirus disease (COVID-19) 07/14/2019  . Malaise and fatigue 07/14/2019  . Chest pain 06/29/2019  . Hypertensive urgency 06/29/2019  . Aftercare including intermittent dialysis ([place]) 05/26/2019  . Inappropriate diet and eating habits 04/30/2019  . Gastro-esophageal reflux disease with esophagitis, without bleeding 04/22/2019  . Pleural effusion, not elsewhere classified 04/22/2019  . Pulmonary hypertension, unspecified ([place]) 04/22/2019  . Palliative care by specialist   . Goals of care, counseling/discussion   . Abscess   . Paroxysmal A-fib ([place])   . Nausea vomiting and diarrhea   . Oropharyngeal dysphagia   . Acute metabolic encephalopathy   . Acute blood loss anemia   . Decubitus ulcer of heel, bilateral, stage 2 ([place]) 02/10/2019  . Sepsis due to methicillin resistant Staphylococcus aureus (MRSA) ([place]) 02/10/2019  . MRSA bacteremia 02/10/2019  . Altered level of consciousness 02/09/2019  . 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 Anemia of chronic renal failure, stage 5 (HCC) 01/17/2019  . Coagulation defect, unspecified ([place]) 01/17/2019  . Fever, unspecified 01/17/2019  . Headache, unspecified 01/17/2019  . Iron deficiency anemia,  unspecified 01/17/2019  . Moderate protein-calorie malnutrition ([place]) 01/17/2019  . Other fluid overload 01/17/2019  . Pain, unspecified 01/17/2019  . Pruritus, unspecified 01/17/2019  . Secondary hyperparathyroidism of renal origin ([place]) 01/17/2019  . Shortness of breath 01/17/2019  . AMS (altered mental status) 09/19/2018  . COVID-19 virus detected 09/19/2018  . Adjustment disorder with depressed mood 08/31/2018  . COVID-19 virus infection 08/24/2018  . Acute on chronic respiratory failure with hypoxia ([place]) 08/24/2018  . Allergic rhinitis 09/19/2017  . Chronic neck pain 07/25/2017  . Hyperkalemia 02/15/2017  . Complication of vascular dialysis catheter, initial encounter 12/01/2016  . ESRD (end stage renal disease) ([place]) 01/19/2016  . Nausea   . GI bleed 10/25/2015  . Hypertensive heart disease 10/24/2015  . Chronic diastolic CHF (congestive heart failure) ([place]) 10/24/2015  . Unstable angina ([place]) 10/23/2015  . Hypotension 09/02/2015  . Chronic systolic CHF (congestive heart failure) ([place]) 09/02/2015  . Depression 07/27/2015  . Calculus of gallbladder with chronic cholecystitis without obstruction   . Bilateral carotid bruits 11/30/2014  . Low magnesium levels 08/10/2013  . Anemia of chronic disease 08/10/2013  . Constipation 08/10/2013  . Atrial fibrillation, chronic ([place]) 07/30/2013  . Coronary artery disease   . CAD (coronary artery disease) 07/02/2013  . Acute systolic heart failure ([place]) 06/30/2013  . NSTEMI (non-ST elevated myocardial infarction) ([place]) 06/29/2013  . Vertigo 08/25/2012  . Sleep disorder 05/23/2012  . Hypothyroid 04/27/2012  . HTN (hypertension) 04/27/2012  . HLD (hyperlipidemia) 04/27/2012  . Type 2 diabetes mellitus with ESRD (end-stage renal disease) ([place]) 04/27/2012  . Neuropathy 04/27/2012   PCP:  [name], MD Pharmacy:  [facility] [address], [place] - [place] [address] Phone:  [phone] Fax: [phone]  [facility] [place], [place] - [address] [place] [address] Phone: [phone] Fax: [phone]     Social Determinants of Health ([place]) Interventions    Readmission Risk Interventions Readmission Risk Prevention Plan 02/04/2020 10/11/2019 10/10/2019  Transportation Screening - - Complete  PCP or Specialist Appt within 3-5 Days - - Complete  HRI or Home Care Consult Complete - Complete  Social Work Consult for [place] Planning/Counseling Complete - Complete  Palliative Care Screening Complete - Not Applicable  Medication Review Press photographer) - - Complete  PCP or Specialist appointment within 3-5 days of discharge - Complete -  [place] or [place] - Patient refused -  SW Recovery Care/Counseling Consult - Complete -  Palliative Care Screening - Not Applicable -  [place] - Not Applicable -  Some recent data might be hidden

## 2020-02-04 NOTE — Care Plan (Signed)
Pt off unit via bed to dialysis at this time.

## 2020-02-04 NOTE — Consult Note (Signed)
[facility] Consult Note  MRN : [id number]  Jamie Arias is a 64 y.o. (September 12, 1955) female who presents with chief complaint of  Chief Complaint  Patient presents with  . Follow-up   History of Present Illness:  Jamie Arias is a 64 y.o. female with medical history significant for ESRD on HD MWF with anemia of CKD, HTN, CAD, A. fib, hypothyroidism, depression, diastolic heart failure who was referred to the ED from the dialysis center after having missed 5 dialysis sessions.  Patient is wheelchair and bedbound and total care dependent for ADLs though can feed herself.  Her husband is her caregiver and most of the history is taken from him.  He says she missed the last 5 dialysis sessions because she simply did not feel to go.  Over the past week he has noted her to have less of an appetite, sleeping much more.  She had an episode of diarrhea about a week ago but that has subsided.  She has had no shortness of breath or swelling, no nausea or vomiting headache. She denies chest pain, fever or chills or cough.  During dialysis yesterday as well as today the patient began to bleed from the more proximal needle site which had worsened today requiring an emergency page to our service.  Upon examination, the patient is lethargic in dialysis with significant bleeding from the more proximal needle site.  Pressure being held.  We will plan on emergency shuntogram possible embolization possible PermCath placement.  Was able to receive consent via the phone from her husband Jamie Arias [phone]) at 245pm today.  Current Facility-Administered Medications  Medication Dose Route Frequency Provider Last Rate Last Admin  . acetaminophen (TYLENOL) tablet 650 mg  650 mg Oral Q6H PRN Athena Masse, MD       Or  . acetaminophen (TYLENOL) suppository 650 mg  650 mg Rectal Q6H PRN Athena Masse, MD      . amiodarone (PACERONE) tablet 100 mg  100 mg Oral Daily Jennye Boroughs, MD   100 mg at 02/04/20 0845  . atorvastatin (LIPITOR) tablet 40 mg  40 mg Oral QHS Jennye Boroughs, MD   40 mg at 02/03/20 2120  . Chlorhexidine Gluconate Cloth 2 % PADS 6 each  6 each Topical Q0600 Murlean Iba, MD      . epoetin alfa (EPOGEN) injection 4,000 Units  4,000 Units Intravenous Once Singh, Harmeet, MD      . feeding supplement (NEPRO CARB STEADY) liquid 237 mL  237 mL Oral BID BM Jennye Boroughs, MD   237 mL at 02/04/20 0848  . FLUoxetine (PROZAC) capsule 20 mg  20 mg Oral QHS Jennye Boroughs, MD   20 mg at 02/03/20 2121  . fluticasone (FLONASE) 50 MCG/ACT nasal spray 2 spray  2 spray Each Nare Daily PRN Jennye Boroughs, MD      . folic acid (FOLVITE) tablet 1 mg  1 mg Oral Daily Jennye Boroughs, MD   1 mg at 02/04/20 0845  . gabapentin (NEURONTIN) capsule 100 mg  100 mg Oral QHS Jennye Boroughs, MD   100 mg at 02/03/20 2120  . heparin injection 1,500 Units  20 Units/kg Dialysis PRN Murlean Iba, MD      . heparin injection 5,000 Units  5,000 Units Subcutaneous Q12H Jennye Boroughs, MD   5,000 Units at 02/04/20 0845  . hydrALAZINE (APRESOLINE) tablet 10 mg  10 mg Oral BID Jennye Boroughs, MD   10 mg  at 02/04/20 0845  . insulin aspart (novoLOG) injection 0-15 Units  0-15 Units Subcutaneous TID WC Judd Gaudier V, MD      . insulin aspart (novoLOG) injection 0-5 Units  0-5 Units Subcutaneous QHS Judd Gaudier V, MD      . levothyroxine (SYNTHROID) tablet 88 mcg  88 mcg Oral Q0600 Jennye Boroughs, MD   88 mcg at 02/04/20 0603  . lidocaine-prilocaine (EMLA) cream   Topical Once Murlean Iba, MD      . melatonin tablet 5 mg  5 mg Oral QHS PRN Jennye Boroughs, MD      . ondansetron [facility]) tablet 4 mg  4 mg Oral Q6H PRN Athena Masse, MD       Or  . ondansetron [facility]) injection 4 mg  4 mg Intravenous Q6H PRN Athena Masse, MD      . pantoprazole (PROTONIX) EC tablet 40 mg  40 mg Oral Daily Jennye Boroughs, MD   40 mg at 02/04/20 0845   Past Medical History:  Diagnosis Date   . Anemia   . Arm DVT (deep venous thromboembolism), acute, left ([place]) 02/13/2019  . Arthritis    upper back and neck   . Chronic diastolic CHF (congestive heart failure) ([place])    a. Due to ischemic cardiomyopathy. EF as low as 35%, improved to normal s/p CABG; b. echo 07/06/13: EF 55-60%, no RWMAs, mod TR, trivial pericardial effusion not c/w tamponade physiology;  c. 10/2015 Echo: EF 65%, Gr1 DD, triv AI, mild MR, mildly dil LA, mod TR, PASP 63mmHg.  Marland Kitchen Coronary artery disease    a. NSTEMI 06/2013; b.cath: severe three-vessel CAD w/ EF 30% & mild-mod MR; c. s/p 3 vessel CABG 07/02/13 (LIMA-LAD, SVG-OM, and SVG-RPDA);  d. 10/2015 MV: no ischemia/infarct.  . Diabetes mellitus without complication ([place])   . Diabetic neuropathy ([place])   . ESRD (end stage renal disease) ([place])    a. 12/2015 initiated - mwf dialysis.  Marland Kitchen GERD (gastroesophageal reflux disease)   . Hyperlipidemia   . Hypertension   . Hypothyroidism   . Myocardial infarction ([place]) 2014  . Pleural effusion 2015  . Pulmonary hypertension ([place])   . Wears dentures    full lower   Past Surgical History:  Procedure Laterality Date  . A/V FISTULAGRAM Right 12/17/2016   Procedure: A/V Fistulagram;  Surgeon: Algernon Huxley, MD;  Location: [facility];  Service: Cardiovascular;  Laterality: Right;  . A/V FISTULAGRAM Right 01/07/2017   Procedure: A/V Fistulagram;  Surgeon: Algernon Huxley, MD;  Location: [facility];  Service: Cardiovascular;  Laterality: Right;  . A/V FISTULAGRAM Right 12/03/2017   Procedure: A/V FISTULAGRAM;  Surgeon: Katha Cabal, MD;  Location: [facility];  Service: Cardiovascular;  Laterality: Right;  . A/V FISTULAGRAM Right 02/23/2019   Procedure: A/V Fistulagram;  Surgeon: Algernon Huxley, MD;  Location: [facility];  Service: Cardiovascular;  Laterality: Right;  . A/V SHUNT INTERVENTION N/A 02/22/2017   Procedure: A/V SHUNT INTERVENTION;  Surgeon: Algernon Huxley, MD;  Location: [facility];  Service: Cardiovascular;  Laterality: N/A;  . AV FISTULA PLACEMENT Right 02/03/2016   Procedure: INSERTION OF ARTERIOVENOUS (AV) GORE-TEX GRAFT ARM ( BRACH / AXILLARY );  Surgeon: Katha Cabal, MD;  Location: [facility];  Service: Vascular;  Laterality: Right;  . AV FISTULA PLACEMENT Left 07/09/2019   Procedure: INSERTION OF ARTERIOVENOUS (AV) GORE-TEX GRAFT ARM ( BRACHIAL AXILLARY );  Surgeon: Lucky Cowboy,  Erskine Squibb, MD;  Location: [facility];  Service: Vascular;  Laterality: Left;  . [place] REMOVAL Right 03/04/2019   Procedure: REMOVAL OF ARTERIOVENOUS GORETEX GRAFT ([place]);  Surgeon: Algernon Huxley, MD;  Location: [facility];  Service: Vascular;  Laterality: Right;  . CARDIAC CATHETERIZATION    . CATARACT EXTRACTION Bilateral   . CHOLECYSTECTOMY N/A 12/09/2014   Procedure: LAPAROSCOPIC CHOLECYSTECTOMY;  Surgeon: Marlyce [name], MD;  Location: [facility];  Service: General;  Laterality: N/A;  . CORONARY ARTERY BYPASS GRAFT N/A 07/02/2013   Procedure: CORONARY ARTERY BYPASS GRAFTING (CABG);  Surgeon: Ivin [name], MD;  Location: [facility];  Service: Open Heart Surgery;  Laterality: N/A;  CABG x three, using left internal mammary artery and right leg greater saphenous vein harvested endoscopically  . DIALYSIS/PERMA CATHETER REMOVAL N/A 10/13/2019   Procedure: DIALYSIS/PERMA CATHETER REMOVAL;  Surgeon: Algernon Huxley, MD;  Location: [facility];  Service: Cardiovascular;  Laterality: N/A;  . ESOPHAGOGASTRODUODENOSCOPY N/A 04/11/2019   Procedure: ESOPHAGOGASTRODUODENOSCOPY (EGD);  Surgeon: Jonathon Bellows, MD;  Location[facility];  Service: Gastroenterology;  Laterality: N/A;  . ESOPHAGOGASTRODUODENOSCOPY (EGD) WITH PROPOFOL N/A 11/24/2015   Procedure: ESOPHAGOGASTRODUODENOSCOPY (EGD) WITH PROPOFOL;  Surgeon: [name], MD;  Location: [facility];  Service: Endoscopy;  Laterality: N/A;  Diabetic - insulin  . ESOPHAGOGASTRODUODENOSCOPY (EGD) WITH PROPOFOL N/A 10/10/2019   Procedure:  ESOPHAGOGASTRODUODENOSCOPY (EGD) WITH PROPOFOL;  Surgeon: Virgel Manifold, MD;  Location: [facility];  Service: Endoscopy;  Laterality: N/A;  . EYE SURGERY Bilateral    Cataract Extraction with IOL  . INTRAOPERATIVE TRANSESOPHAGEAL ECHOCARDIOGRAM N/A 07/02/2013   Procedure: INTRAOPERATIVE TRANSESOPHAGEAL ECHOCARDIOGRAM;  Surgeon: Ivin [name], MD;  Location: [facility];  Service: Open Heart Surgery;  Laterality: N/A;  . PEG PLACEMENT N/A 03/03/2019   Procedure: PERCUTANEOUS ENDOSCOPIC GASTROSTOMY (PEG) PLACEMENT;  Surgeon: Virgel Manifold, MD;  Location: [facility];  Service: Endoscopy;  Laterality: N/A;  . PERIPHERAL VASCULAR CATHETERIZATION Right 12/06/2015   Procedure: Dialysis/Perma Catheter Insertion;  Surgeon: Katha Cabal, MD;  Location: [facility];  Service: Cardiovascular;  Laterality: Right;  . PERIPHERAL VASCULAR THROMBECTOMY Right 09/28/2016   Procedure: Peripheral Vascular Thrombectomy;  Surgeon: Katha Cabal, MD;  Location: [facility];  Service: Cardiovascular;  Laterality: Right;  . PERIPHERAL VASCULAR THROMBECTOMY Right 05/20/2018   Procedure: PERIPHERAL VASCULAR THROMBECTOMY;  Surgeon: Katha Cabal, MD;  Location: [facility];  Service: Cardiovascular;  Laterality: Right;  . PORTA CATH REMOVAL N/A 06/01/2016   Procedure: Glori [name] Cath Removal;  Surgeon: Katha Cabal, MD;  Location: [facility];  Service: Cardiovascular;  Laterality: N/A;  . THORACENTESIS Left 2015   Social History Social History   Tobacco Use  . Smoking status: Never Smoker  . Smokeless tobacco: Never Used  Vaping Use  . Vaping Use: Never used  Substance Use Topics  . Alcohol use: No    Alcohol/week: 0.0 standard drinks  . Drug use: No   Family History Family History  Problem Relation Age of Onset  . COPD Mother   . Cancer Mother        Lung  . Pulmonary embolism Father   . Diabetes Father   . Diabetes Paternal Grandfather   .  Heart disease Maternal Grandmother   . Colon cancer Neg Hx   . Colon polyps Neg Hx   . Esophageal cancer Neg Hx   . Pancreatic cancer Neg Hx   . Liver disease Neg Hx   Denies  family history of peripheral artery disease, venous disease or renal disease.  Allergies  Allergen Reactions  . Nsaids Other (See Comments)    Contraindicated due to kidney disease.  Judeth [place] Reductase Inhibitors   . Doxycycline Other (See Comments)    tremor   REVIEW OF SYSTEMS (Negative unless checked)  Constitutional: [] Weight loss  [] Fever  [] Chills Cardiac: [] Chest pain   [] Chest pressure   [] Palpitations   [] Shortness of breath when laying flat   [] Shortness of breath at rest   [] Shortness of breath with exertion. Vascular:  [] Pain in legs with walking   [] Pain in legs at rest   [] Pain in legs when laying flat   [] Claudication   [] Pain in feet when walking  [] Pain in feet at rest  [] Pain in feet when laying flat   [] History of DVT   [] Phlebitis   [] Swelling in legs   [] Varicose veins   [] Non-healing ulcers Pulmonary:   [] Uses home oxygen   [] Productive cough   [] Hemoptysis   [] Wheeze  [] COPD   [] Asthma Neurologic:  [] Dizziness  [] Blackouts   [] Seizures   [] History of stroke   [] History of TIA  [] Aphasia   [] Temporary blindness   [] Dysphagia   [] Weakness or numbness in arms   [] Weakness or numbness in legs Musculoskeletal:  [] Arthritis   [] Joint swelling   [] Joint pain   [] Low back pain Hematologic:  [] Easy bruising  [x] Easy bleeding   [] Hypercoagulable state   [] Anemic  [] Hepatitis Gastrointestinal:  [] Blood in stool   [] Vomiting blood  [] Gastroesophageal reflux/heartburn   [] Difficulty swallowing. Genitourinary:  [x] Chronic kidney disease   [] Difficult urination  [] Frequent urination  [] Burning with urination   [] Blood in urine Skin:  [] Rashes   [] Ulcers   [] Wounds Psychological:  [] History of anxiety   []  History of major depression.  Physical Examination  Vitals:   02/03/20 1938 02/04/20 0357 02/04/20  0842 02/04/20 1133  BP: (!) 154/57 (!) 127/51 (!) 157/51 (!) 159/49  Pulse: 62 60 62 64  Resp: 17 17 16 19   Temp: 97.7 F (36.5 C) 98.2 F (36.8 C) 98.8 F (37.1 C) 98.9 F (37.2 C)  TempSrc: Oral  Oral Oral  SpO2: 92% 94% 100% 100%  Weight:      Height:       Body mass index is 28.31 kg/m. Gen:  WD/WN, NAD Head: [place]/AT, No temporalis wasting. Prominent temp pulse not noted. Ear/Nose/Throat: Hearing grossly intact, nares w/o erythema or drainage, oropharynx w/o Erythema/Exudate Eyes: Sclera non-icteric, conjunctiva clear Neck: Trachea midline.  No JVD.  Pulmonary:  Good air movement, respirations not labored, equal bilaterally.  Cardiac: RRR, normal S1, S2. Vascular:  Vessel Right Left  Radial Palpable Palpable  Ulnar Palpable Palpable  Brachial Palpable Palpable  Carotid Palpable, without bruit Palpable, without bruit  Aorta Not palpable N/A                   Left upper extremity: Good bruit and thrill in brachial axillary graft. Significant bleeding from the proximal needle site.  Extremities warm.  Area surrounding graft is soft without hematoma.  Gastrointestinal: soft, non-tender/non-distended. No guarding/reflex.  Musculoskeletal: M/S 5/5 throughout.  Extremities without ischemic changes.  No deformity or atrophy. No edema. Neurologic: Sensation grossly intact in extremities.  Symmetrical.  Speech is fluent. Motor exam as listed above. Psychiatric: Judgment intact, Mood & affect appropriate for pt's clinical situation. Dermatologic: No rashes or ulcers noted.  No cellulitis or open wounds. Lymph : No Cervical, Axillary, or  Inguinal lymphadenopathy.  CBC Lab Results  Component Value Date   WBC 6.3 02/03/2020   HGB 8.2 (L) 02/03/2020   HCT 25.8 (L) 02/03/2020   MCV 91.2 02/03/2020   PLT 130 (L) 02/03/2020   BMET    Component Value Date/Time   NA 136 02/03/2020 0434   NA 138 06/23/2019 1600   NA 140 03/30/2014 1611   K 3.5 02/03/2020 0434   K 4.3  03/30/2014 1611   CL 96 (L) 02/03/2020 0434   CL 105 03/30/2014 1611   CO2 24 02/03/2020 0434   CO2 29 03/30/2014 1611   GLUCOSE 117 (H) 02/03/2020 0434   GLUCOSE 187 (H) 03/30/2014 1611   BUN 70 (H) 02/03/2020 0434   BUN 21 06/23/2019 1600   BUN 21 (H) 03/30/2014 1611   CREATININE 16.65 (H) 02/03/2020 0434   CREATININE 3.14 (H) 09/09/2015 1534   CALCIUM 8.4 (L) 02/03/2020 0434   CALCIUM 9.0 03/30/2014 1611   GFRNONAA 2 (L) 02/03/2020 0434   GFRNONAA 36 (L) 03/30/2014 1611   GFRNONAA >60 07/23/2013 0741   GFRAA 12 (L) 10/14/2019 0544   GFRAA 44 (L) 03/30/2014 1611   GFRAA >60 07/23/2013 0741   Estimated Creatinine Clearance: 3.3 mL/min (A) (by C-G formula based on SCr of 16.65 mg/dL (H)).  COAG Lab Results  Component Value Date   INR 1.4 (H) 07/07/2019   INR 1.1 03/20/2019   INR 1.4 (H) 03/13/2019   Radiology DG Chest Port 1 View  Result Date: 02/02/2020 CLINICAL DATA:  End-stage renal disease, uremia EXAM: PORTABLE CHEST 1 VIEW COMPARISON:  10/08/2019 FINDINGS: The lungs are symmetrically expanded. Trace interstitial pulmonary edema has developed, new since prior examination. No pneumothorax or pleural effusion. Stable left pleural thickening. Coronary artery bypass grafting has been performed. Cardiac size is at the upper limits of normal. No acute bone abnormality. IMPRESSION: Interval development of trace perihilar interstitial pulmonary edema. Electronically Signed   By: Fidela Salisbury MD   On: 02/02/2020 22:11   Assessment/Plan The patient is a 64 year old female well-known to our service with a known history of end-stage renal disease currently maintained on hemodialysis who presented to the [facility] emergency department after missing six dialysis treatments  1.  End-Stage Renal Disease: Patient is known to our service.  Patient with a left upper extremity brachial axillary graft.  We were consulted emergently as the patient was experiencing  bleeding from the proximal needle site in her graft during dialysis.  Upon examination the patient is lethargic and there is significant bleeding noted.  Sounds like the patient has been having bleeding issues since yesterday's dialysis treatment.  We will plan on an emergent shuntogram with possible intervention and attempt assess the graft and the patient's anatomy if unable to stop the bleeding may possibly have to embolize the graft and will place a PermCath to allow the patient to dialyze.  This was all explained to the patient's husband Yevette Knust at [phone] via the phone.  Patient's husband gave consent for emergent shuntogram.  2. Palliative Care Consult? As per epic notation of sounds like the patient chose not to dialyze missing 6 sessions. Patient is currently living at home with husband who was aware of this. ?  Consult to palliative care  3.  Hyperlipidemia: On statin for medical management Encouraged good control as its slows the progression of atherosclerotic disease  Discussed with Dr. Mayme Genta, PA-C  02/04/2020 3:02 PM    This note  was created with Dragon medical transcription system.  Any error is purely unintentional

## 2020-02-04 NOTE — Progress Notes (Signed)
1. PT went to vascular earlier this evening and found to have Axillary vein and venous anastomosis had greater than 95% stenosis.  The remainder of the graft was patent and the remainder of the central venous circulation was patent. No need for Catheter at this time. Stent placed By Dr. Lucky Cowboy and she will be added to  Dialysis schedule tomorrow  Dr. Candiss Norse aware of plan.  Jeanett Schlein RN

## 2020-02-04 NOTE — Progress Notes (Addendum)
Pt arrived at dialysis at 1345 . Cannulation sites from yesterday still bleeding.  LAVG BT+ Cannulation with 17 gauge needles with good blood flash  but when needles were advanced Pt  stopped fllowing and no return . No clots seen and BT still positive.  Multiple attempts to adjust needle without success. Pt restuck at arterial site without success and  Bleeding around the needed site Both needles pulled with prolonged bleeding of over 30 min for arterial and 45 for venous.  Dr. Candiss Norse notified as was the vascular team. Pt will be transferred to [facility] per [facility] nurse also notified   Jeanett Schlein, RN

## 2020-02-04 NOTE — Progress Notes (Addendum)
Progress Note    Jamie Arias  KGM:[id number] DOB: Jan 24, 1956  DOA: 02/02/2020 PCP: [name], MD      Brief Narrative:    Medical records reviewed and are as summarized below:  Jamie Arias is a 64 y.o. female with medical history significant for ESRD on HD MWF with anemia of CKD, HTN, CAD, A. fib, hypothyroidism, depression, chronic diastolic heart failure who was referred to the ED from the dialysis center after having missed 5 dialysis sessions.  Patient is wheelchair and bedbound and total care dependent for ADLs though can feed herself.  Her husband is her caregiver.  She said she missed 5 dialysis sessions because she just did not feel like going to an outpatient dialysis center.  She has been eating less and sleeping more.  She was admitted to the hospital for acute metabolic/uremic encephalopathy likely due to missed hemodialysis sessions.    Assessment/Plan:   Principal Problem:   Acute metabolic encephalopathy Active Problems:   Hypothyroid   HTN (hypertension)   Type 2 diabetes mellitus with ESRD (end-stage renal disease) (HCC)   Coronary artery disease   Atrial fibrillation, chronic (HCC)   Depression   Chronic diastolic CHF (congestive heart failure) (HCC)   ESRD (end stage renal disease) (HCC)   Anemia of chronic renal failure, stage 5 (HCC)   Hypoglycemia   Functional quadriplegia (HCC)    Body mass index is 28.31 kg/m.    Acute toxic metabolic/uremic encephalopathy due to missed hemodialysis, ESRD, malfunctioning and bleeding left brachial artery to axillary vein arteriovenous graft:   Hemodialysis was attempted today but was unsuccessful because she had bleeding from her dialysis graft access site.  Vascular surgeon was consulted shuntogram was performed.  She had an occluded outflow vein, which was ballooned and stented. Plan for hemodialysis tomorrow.  Follow-up with nephrologist.  Trazodone has been discontinued.  Hypertension:  Continue antihypertensives  CAD, chronic diastolic CHF, paroxysmal atrial fibrillation: No acute issues.  Patient says she no longer takes Eliquis.  This was confirmed by Anderson Malta, RN.  Continue Lipitor and amiodarone.  Anemia of chronic disease: H&H is stable.  Continue Epogen.  Type II DM with hypoglycemia on admission: Hemoglobin A1c is 4.6. Monitor glucose levels closely.  Patient is bedbound and wheelchair-bound.  Husband said she has not been able to walk since he had COVID-19 infection in June 2020.  Plan of care was discussed with the husband over the phone.   Diet Order            Diet NPO time specified  Diet effective midnight           Diet NPO time specified  Diet effective now                    Consultants:  Nephrologist  Procedures:  None    Medications:   . [MAR Hold] amiodarone  100 mg Oral Daily  . [MAR Hold] atorvastatin  40 mg Oral QHS  . [MAR Hold] Chlorhexidine Gluconate Cloth  6 each Topical Q0600  . [MAR Hold] epoetin (EPOGEN/PROCRIT) injection  4,000 Units Intravenous Once  . [MAR Hold] feeding supplement (NEPRO CARB STEADY)  237 mL Oral BID BM  . fentaNYL      . [MAR Hold] FLUoxetine  20 mg Oral QHS  . [MAR Hold] folic acid  1 mg Oral Daily  . [MAR Hold] gabapentin  100 mg Oral QHS  . [MAR Hold] heparin injection (subcutaneous)  5,000 Units Subcutaneous Q12H  . [MAR Hold] hydrALAZINE  10 mg Oral BID  . [MAR Hold] insulin aspart  0-15 Units Subcutaneous TID WC  . [MAR Hold] insulin aspart  0-5 Units Subcutaneous QHS  . [MAR Hold] levothyroxine  88 mcg Oral Q0600  . [MAR Hold] lidocaine-prilocaine   Topical Once  . midazolam      . [MAR Hold] pantoprazole  40 mg Oral Daily   Continuous Infusions: . sodium chloride    .  ceFAZolin (ANCEF) IV       Anti-infectives (From admission, onward)   Start     Dose/Rate Route Frequency Ordered Stop   02/04/20 1515  ceFAZolin (ANCEF) IVPB 1 g/50 mL premix        1 g 100 mL/hr over 30  Minutes Intravenous  Once 02/04/20 1507               Family Communication/Anticipated D/C date and plan/Code Status   DVT prophylaxis:      Code Status: Full Code  Family Communication: Plan discussed with her husband over the phone Disposition Plan:    Status is: Inpatient  Remains inpatient appropriate because:Inpatient level of care appropriate due to severity of illness   Dispo: The patient is from: Home              Anticipated d/c is to: Home              Anticipated d/c date is: 2 days              Patient currently is not medically stable to d/c.                Subjective:   Interval events noted.  She is more lethargic and unable to provide any history.  According to [place], RN, patient had trazodone last night.  Objective:    Vitals:   02/04/20 1550 02/04/20 1555 02/04/20 1600 02/04/20 1605  BP: (!) 183/63  (!) 184/79 (!) 173/80  Pulse: 63 64 62 64  Resp: 16 14 12 15   Temp:      TempSrc:      SpO2: 97% 99% 99% 100%  Weight:      Height:       No data found.   Intake/Output Summary (Last 24 hours) at 02/04/2020 1608 Last data filed at 02/04/2020 0842 Gross per 24 hour  Intake --  Output 50 ml  Net -50 ml   Filed Weights   02/02/20 1925 02/04/20 1528  Weight: 74.8 kg 74.8 kg    Exam:  GEN: NAD SKIN: Warm and dry EYES: No pallor or icterus ENT: MMM CV: RRR PULM: CTA B ABD: soft, ND, NT, +BS CNS: Lethargic but arousable.  Limited exam because she does not follow commands. EXT: No edema or tenderness.  Tremors of bilateral upper extremities and mouth      Data Reviewed:   I have personally reviewed following labs and imaging studies:  Labs: Labs show the following:   Basic Metabolic Panel: Recent Labs  Lab 02/02/20 1941 02/03/20 0434  NA 139 136  K 3.7 3.5  CL 98 96*  CO2 22 24  GLUCOSE 67* 117*  BUN 68* 70*  CREATININE 16.46* 16.65*  CALCIUM 8.5* 8.4*   GFR Estimated Creatinine Clearance: 3.3  mL/min (A) (by C-G formula based on SCr of 16.65 mg/dL (H)). Liver Function Tests: No results for input(s): AST, ALT, ALKPHOS, BILITOT, PROT, ALBUMIN in the last 168 hours. No results for  input(s): LIPASE, AMYLASE in the last 168 hours. No results for input(s): AMMONIA in the last 168 hours. Coagulation profile No results for input(s): INR, PROTIME in the last 168 hours.  CBC: Recent Labs  Lab 02/02/20 1941 02/03/20 0434  WBC 6.1 6.3  NEUTROABS 5.0  --   HGB 8.9* 8.2*  HCT 27.6* 25.8*  MCV 90.8 91.2  PLT 138* 130*   Cardiac Enzymes: No results for input(s): CKTOTAL, CKMB, CKMBINDEX, TROPONINI in the last 168 hours. BNP (last 3 results) No results for input(s): PROBNP in the last 8760 hours. CBG: Recent Labs  Lab 02/03/20 1619 02/03/20 2105 02/04/20 0734 02/04/20 1159 02/04/20 1532  GLUCAP 107* 96 86 85 74   D-Dimer: No results for input(s): DDIMER in the last 72 hours. Hgb A1c: Recent Labs    02/03/20 0434  HGBA1C 4.6*   Lipid Profile: No results for input(s): CHOL, HDL, LDLCALC, TRIG, CHOLHDL, LDLDIRECT in the last 72 hours. Thyroid function studies: No results for input(s): TSH, T4TOTAL, T3FREE, THYROIDAB in the last 72 hours.  Invalid input(s): FREET3 Anemia work up: No results for input(s): VITAMINB12, FOLATE, FERRITIN, TIBC, IRON, RETICCTPCT in the last 72 hours. Sepsis Labs: Recent Labs  Lab 02/02/20 1941 02/03/20 0434  WBC 6.1 6.3    Microbiology Recent Results (from the past 240 hour(s))  Resp Panel by RT-PCR (Flu A&B, Covid) Nasopharyngeal Swab     Status: None   Collection Time: 02/02/20  7:36 PM   Specimen: Nasopharyngeal Swab; Nasopharyngeal(NP) swabs in vial transport medium  Result Value Ref Range Status   SARS Coronavirus 2 by RT PCR NEGATIVE NEGATIVE Final    Comment: (NOTE) SARS-CoV-2 target nucleic acids are NOT DETECTED.  The SARS-CoV-2 RNA is generally detectable in upper respiratory specimens during the acute phase of  infection. The lowest concentration of SARS-CoV-2 viral copies this assay can detect is 138 copies/mL. A negative result does not preclude SARS-Cov-2 infection and should not be used as the sole basis for treatment or other patient management decisions. A negative result may occur with  improper specimen collection/handling, submission of specimen other than nasopharyngeal swab, presence of viral mutation(s) within the areas targeted by this assay, and inadequate number of viral copies(<138 copies/mL). A negative result must be combined with clinical observations, patient history, and epidemiological information. The expected result is Negative.  Fact Sheet for Patients:  EntrepreneurPulse.com.au  Fact Sheet for Healthcare Providers:  IncredibleEmployment.be  This test is no t yet approved or cleared by the [place] FDA and  has been authorized for detection and/or diagnosis of SARS-CoV-2 by FDA under an Emergency Use Authorization (EUA). This EUA will remain  in effect (meaning this test can be used) for the duration of the COVID-19 declaration under Section 564(b)(1) of the Act, 21 U.S.C.section 360bbb-3(b)(1), unless the authorization is terminated  or revoked sooner.       Influenza A by PCR NEGATIVE NEGATIVE Final   Influenza B by PCR NEGATIVE NEGATIVE Final    Comment: (NOTE) The Xpert Xpress SARS-CoV-2/FLU/RSV plus assay is intended as an aid in the diagnosis of influenza from Nasopharyngeal swab specimens and should not be used as a sole basis for treatment. Nasal washings and aspirates are unacceptable for Xpert Xpress SARS-CoV-2/FLU/RSV testing.  Fact Sheet for Patients: EntrepreneurPulse.com.au  Fact Sheet for Healthcare Providers: IncredibleEmployment.be  This test is not yet approved or cleared by the [place] FDA and has been authorized for detection and/or diagnosis of SARS-CoV-2  by FDA under an Emergency  Use Authorization (EUA). This EUA will remain in effect (meaning this test can be used) for the duration of the COVID-19 declaration under Section 564(b)(1) of the Act, 21 U.S.C. section 360bbb-3(b)(1), unless the authorization is terminated or revoked.  Performed at [facility] [address] [place], [place] [address]   MRSA PCR Screening     Status: None   Collection Time: 02/03/20  1:45 PM   Specimen: Nasopharyngeal  Result Value Ref Range Status   MRSA by PCR NEGATIVE NEGATIVE Final    Comment:        The GeneXpert MRSA Assay (FDA approved for NASAL specimens only), is one component of a comprehensive MRSA colonization surveillance program. It is not intended to diagnose MRSA infection nor to guide or monitor treatment for MRSA infections. Performed at [facility], [address] [place], [place] [address]     Procedures and diagnostic studies:  DG Chest [facility] 1 View  Result Date: 02/02/2020 CLINICAL DATA:  End-stage renal disease, uremia EXAM: PORTABLE CHEST 1 VIEW COMPARISON:  10/08/2019 FINDINGS: The lungs are symmetrically expanded. Trace interstitial pulmonary edema has developed, new since prior examination. No pneumothorax or pleural effusion. Stable left pleural thickening. Coronary artery bypass grafting has been performed. Cardiac size is at the upper limits of normal. No acute bone abnormality. IMPRESSION: Interval development of trace perihilar interstitial pulmonary edema. Electronically Signed   By: Fidela Salisbury MD   On: 02/02/2020 22:11               LOS: 1 day   [facility]   Pager on [email or web address]. If 7PM-7AM, please contact night-coverage at [email or web address]     02/04/2020, 4:08 PM

## 2020-02-04 NOTE — Treatment Plan (Signed)
Pt back to unit via bed at this time, pt is AxOx3. Able to answer her name, place where she is and who was with her and how long shes been married. More alert than this am. No bleeding noted to left arm, bruit thrill present. Purewick replaced. Pt dinner ordered. Spouse at bedside.

## 2020-02-04 NOTE — Op Note (Signed)
[facility]    OPERATIVE NOTE   PROCEDURE: 1.  Left brachial artery to axillary vein arteriovenous graft cannulation under ultrasound guidance 2.  Left arm shuntogram 3.  Percutaneous transluminal angioplasty of the left axillary vein and venous anastomosis with 7 mm diameter by 6 cm length Lutonix drug-coated angioplasty balloon 4.  Viabahn stent placement to the venous anastomosis and axillary vein with 8 mm diameter by 5 cm length stent  PRE-OPERATIVE DIAGNOSIS: 1. ESRD 2. Malfunctioning and bleeding left brachial artery to axillary vein arteriovenous graft  POST-OPERATIVE DIAGNOSIS: same as above   SURGEON: Leotis Pain, MD  ANESTHESIA: local with MCS  ESTIMATED BLOOD LOSS: 5 cc  FINDING(S): 1. Axillary vein and venous anastomosis had greater than 95% stenosis.  The remainder of the graft was patent and the remainder of the central venous circulation was patent.  SPECIMEN(S):  None  CONTRAST: 35 cc  FLUORO TIME: 1.6 minutes  MODERATE CONSCIOUS SEDATION TIME:  Approximately 17 minutes using 1 mg of Versed  INDICATIONS: Jamie Arias is a 64 y.o. female who presents with malfunctioning bleeding left brachial artery to axillary vein arteriovenous graft.  The patient is scheduled for left arm shuntogram.  The patient is aware the risks include but are not limited to: bleeding, infection, thrombosis of the cannulated access, and possible anaphylactic reaction to the contrast.  The patient is aware of the risks of the procedure and elects to proceed forward.  DESCRIPTION: After full informed written consent was obtained, the patient was brought back to the angiography suite and placed supine upon the angiography table.  The patient was connected to monitoring equipment. Moderate conscious sedation was administered during a face to face encounter throughout the procedure with my supervision of the RN administering medicines and monitoring the patient's vital  signs, pulse oximetry, telemetry and mental status throughout from the start of the procedure until the patient was taken to the recovery room The left arm was prepped and draped in the standard fashion for a percutaneous access intervention.  Under ultrasound guidance, the left brachial artery to axillary vein arteriovenous graft was cannulated with a micropuncture needle under direct ultrasound guidance were it was patent and a permanent image was performed.  The microwire was advanced into the graft and the needle was exchanged for the a microsheath.  I then upsized to a 6 Fr Sheath and imaging was performed.  Hand injections were completed to image the access including the central venous system. This demonstrated an axillary vein and venous anastomosis had greater than 95% stenosis.  The remainder of the graft was patent and the remainder of the central venous circulation was patent..  Based on the images, this patient will need intervention to this venous anastomotic and axillary vein stenosis.   I then crossed the stenosis with a Terumo advantage wire.  Based on the imaging, a 7 mm x 6 cm Lutonix drug-coated angioplasty balloon was selected.  The balloon was centered around the stenosis and inflated to 16 ATM for 1 minute(s).  On completion imaging, a greater than 50% residual stenosis was present.  I then upsized to a 7 [place] sheath and exchanged for 0.018 wire.  I then used an 8 mm diameter by 5 cm length Viabahn stent and deployed this across the venous anastomosis in the axillary vein to encompass the stenosis.  This is postdilated with a 7 mm balloon with excellent angiographic completion result and less than 10% residual stenosis.   Based  on the completion imaging, no further intervention is necessary.  The wire and balloon were removed from the sheath.  A 4-0 Monocryl purse-string suture was sewn around the sheath.  The sheath was removed while tying down the suture.  A sterile bandage was applied  to the puncture site.  COMPLICATIONS: None  CONDITION: Stable   Leotis Pain  02/04/2020 4:10 PM    This note was created with Dragon Medical transcription system. Any errors in dictation are purely unintentional.

## 2020-02-04 NOTE — Progress Notes (Signed)
Addendum: Patient is very lethargic today.  Nursing staff reports that she got trazodone last night.  I have discontinued.. Patient still had bleeding from her access sites from yesterday.  He also had significant bleeding with high pressure from her stick today to the graft.  Vascular surgery was consulted.  Team has evaluated the patient and are planning for angiogram and possible PermCath.

## 2020-02-04 NOTE — Treatment Plan (Signed)
Dialysis unable to be performed at this time r/t bleeding from fistula. Per Dr. Candiss Norse, NPO pending intervention .

## 2020-02-04 NOTE — Treatment Plan (Signed)
Per report from special procedures, can use fistula tomorrow [phone]

## 2020-02-05 ENCOUNTER — Other Ambulatory Visit (INDEPENDENT_AMBULATORY_CARE_PROVIDER_SITE_OTHER): Payer: Self-pay | Admitting: Vascular Surgery

## 2020-02-05 ENCOUNTER — Inpatient Hospital Stay
Admission: EM | Disposition: A | Discharge status: Home / Self Care | Payer: Self-pay | Source: Home / Self Care | Attending: Internal Medicine

## 2020-02-05 ENCOUNTER — Encounter: Payer: Self-pay | Admitting: Vascular Surgery

## 2020-02-05 DIAGNOSIS — T82898A Other specified complication of vascular prosthetic devices, implants and grafts, initial encounter: Secondary | ICD-10-CM | POA: Diagnosis not present

## 2020-02-05 DIAGNOSIS — N186 End stage renal disease: Secondary | ICD-10-CM

## 2020-02-05 DIAGNOSIS — Z992 Dependence on renal dialysis: Secondary | ICD-10-CM

## 2020-02-05 HISTORY — PX: DIALYSIS/PERMA CATHETER INSERTION: CATH118288

## 2020-02-05 LAB — RENAL FUNCTION PANEL
Albumin: 2.3 g/dL — ABNORMAL LOW (ref 3.5–5.0)
Anion gap: 19 — ABNORMAL HIGH (ref 5–15)
BUN: 81 mg/dL — ABNORMAL HIGH (ref 8–23)
CO2: 22 mmol/L (ref 22–32)
Calcium: 8.3 mg/dL — ABNORMAL LOW (ref 8.9–10.3)
Chloride: 98 mmol/L (ref 98–111)
Creatinine, Ser: 17.97 mg/dL — ABNORMAL HIGH (ref 0.44–1.00)
GFR, Estimated: 2 mL/min — ABNORMAL LOW (ref >60)
Glucose, Bld: 78 mg/dL (ref 70–99)
Phosphorus: 10 mg/dL — ABNORMAL HIGH (ref 2.5–4.6)
Potassium: 3.8 mmol/L (ref 3.5–5.1)
Sodium: 139 mmol/L (ref 135–145)

## 2020-02-05 LAB — GLUCOSE, CAPILLARY
Glucose-Capillary: 73 mg/dL (ref 70–99)
Glucose-Capillary: 66 mg/dL — ABNORMAL LOW (ref 70–99)
Glucose-Capillary: 110 mg/dL — ABNORMAL HIGH (ref 70–99)
Glucose-Capillary: 76 mg/dL (ref 70–99)
Glucose-Capillary: 122 mg/dL — ABNORMAL HIGH (ref 70–99)
Glucose-Capillary: 59 mg/dL — ABNORMAL LOW (ref 70–99)

## 2020-02-05 SURGERY — DIALYSIS/PERMA CATHETER INSERTION
Anesthesia: Moderate Sedation

## 2020-02-05 MED ORDER — MIDAZOLAM HCL 2 MG/2ML IJ SOLN
INTRAMUSCULAR | Status: DC | PRN
  Administered 2020-02-05: 1 mg via INTRAVENOUS

## 2020-02-05 MED ORDER — MIDAZOLAM HCL 2 MG/ML PO SYRP: 8.0000 mg | ORAL_SOLUTION | Freq: Once | ORAL | Status: DC | PRN

## 2020-02-05 MED ORDER — FENTANYL CITRATE (PF) 100 MCG/2ML IJ SOLN
INTRAMUSCULAR | Status: AC
  Filled 2020-02-05: qty 2

## 2020-02-05 MED ORDER — FAMOTIDINE 20 MG PO TABS: 40.0000 mg | ORAL_TABLET | Freq: Once | ORAL | Status: DC | PRN

## 2020-02-05 MED ORDER — CEFAZOLIN SODIUM-DEXTROSE 1-4 GM/50ML-% IV SOLN
1.0000 g | Freq: Once | INTRAVENOUS | Status: AC
  Administered 2020-02-05: 1 g via INTRAVENOUS

## 2020-02-05 MED ORDER — DEXTROSE 50 % IV SOLN
12.5000 g | INTRAVENOUS | Status: AC
  Administered 2020-02-05: 12.5 g via INTRAVENOUS

## 2020-02-05 MED ORDER — HEPARIN SODIUM (PORCINE) 5000 UNIT/ML IJ SOLN
INTRAMUSCULAR | Status: AC
  Filled 2020-02-05: qty 2

## 2020-02-05 MED ORDER — DEXTROSE 50 % IV SOLN
INTRAVENOUS | Status: AC
  Administered 2020-02-05: 12.5 g via INTRAVENOUS
  Filled 2020-02-05: qty 50

## 2020-02-05 MED ORDER — METHYLPREDNISOLONE SODIUM SUCC 125 MG IJ SOLR: 125.0000 mg | Freq: Once | INTRAMUSCULAR | Status: DC | PRN

## 2020-02-05 MED ORDER — CEFAZOLIN SODIUM-DEXTROSE 1-4 GM/50ML-% IV SOLN
INTRAVENOUS | Status: AC
  Filled 2020-02-05: qty 50

## 2020-02-05 MED ORDER — SODIUM CHLORIDE 0.9 % IV SOLN: INTRAVENOUS | Status: DC

## 2020-02-05 MED ORDER — ONDANSETRON HCL 4 MG/2ML IJ SOLN
4.0000 mg | Freq: Four times a day (QID) | INTRAMUSCULAR | Status: DC | PRN
  Administered 2020-02-11 – 2020-02-12 (×2): 4 mg via INTRAVENOUS
  Filled 2020-02-05 (×2): qty 2

## 2020-02-05 MED ORDER — DEXTROSE 50 % IV SOLN: 12.5000 g | INTRAVENOUS | Status: AC

## 2020-02-05 MED ORDER — HYDROMORPHONE HCL 1 MG/ML IJ SOLN: 1.0000 mg | Freq: Once | INTRAMUSCULAR | Status: DC | PRN

## 2020-02-05 MED ORDER — DIPHENHYDRAMINE HCL 50 MG/ML IJ SOLN: 50.0000 mg | Freq: Once | INTRAMUSCULAR | Status: DC | PRN

## 2020-02-05 MED ORDER — DEXTROSE 50 % IV SOLN
12.5000 g | INTRAVENOUS | Status: AC
  Filled 2020-02-05: qty 50

## 2020-02-05 MED ORDER — MIDAZOLAM HCL 2 MG/2ML IJ SOLN
INTRAMUSCULAR | Status: AC
  Filled 2020-02-05: qty 2

## 2020-02-05 MED ORDER — FENTANYL CITRATE (PF) 100 MCG/2ML IJ SOLN
INTRAMUSCULAR | Status: DC | PRN
  Administered 2020-02-05: 25 ug via INTRAVENOUS

## 2020-02-05 SURGICAL SUPPLY — 6 items
CATH PALINDROME SI SILVER 33CM (CATHETERS) ×3 IMPLANT
DERMABOND ADVANCED (GAUZE/BANDAGES/DRESSINGS) ×2
DERMABOND ADVANCED .7 DNX12 (GAUZE/BANDAGES/DRESSINGS) ×1 IMPLANT
PACK ANGIOGRAPHY (CUSTOM PROCEDURE TRAY) ×3 IMPLANT
SUT MNCRL AB 4-0 PS2 18 (SUTURE) ×3 IMPLANT
SUT PROLENE 0 CT 1 30 (SUTURE) ×3 IMPLANT

## 2020-02-05 NOTE — Progress Notes (Signed)
Progress Note    Jamie Arias  NAT:[id number] DOB: 12-11-1955  DOA: 02/02/2020 PCP: [name], MD      Brief Narrative:    Medical records reviewed and are as summarized below:  Jamie Arias is a 64 y.o. female with medical history significant for ESRD on HD MWF with anemia of CKD, HTN, CAD, A. fib, hypothyroidism, depression, chronic diastolic heart failure who was referred to the ED from the dialysis center after having missed 5 dialysis sessions.  Patient is wheelchair and bedbound and total care dependent for ADLs though can feed herself.  Her husband is her caregiver.  She said she missed 5 dialysis sessions because she just did not feel like going to an outpatient dialysis center.  She has been eating less and sleeping more.  She was admitted to the hospital for acute metabolic/uremic encephalopathy likely due to missed hemodialysis sessions.    Assessment/Plan:   Principal Problem:   Acute metabolic encephalopathy Active Problems:   Hypothyroid   HTN (hypertension)   Type 2 diabetes mellitus with ESRD (end-stage renal disease) (HCC)   Coronary artery disease   Atrial fibrillation, chronic (HCC)   Depression   Chronic diastolic CHF (congestive heart failure) (HCC)   ESRD (end stage renal disease) (HCC)   Anemia of chronic renal failure, stage 5 (HCC)   Hypoglycemia   Functional quadriplegia (HCC)    Body mass index is 28.31 kg/m.    Acute toxic metabolic/uremic encephalopathy due to missed hemodialysis, ESRD, malfunctioning  with recent bleeding bleeding left brachial AV graft:   Left arm AV graft access is still dysfunctional and it cannot be used for hemodialysis.  Plan for PermCath placement today by vascular surgeon and subsequent hemodialysis.  New permacath.  Follow-up with nephrologist and vascular surgeon.  Hypertension: Hydralazine was held this morning because of low diastolic blood pressure.  CAD, chronic diastolic CHF, paroxysmal  atrial fibrillation: No acute issues. Continue Lipitor and amiodarone.  She has been off of Eliquis because of history of GI bleed and severe anemia.  Anemia of chronic disease: H&H is stable.  Continue Epogen.  Type II DM with hypoglycemia on admission, hypoglycemic (64) today: Hemoglobin A1c is 4.6. Monitor glucose levels closely.  IV dextrose as needed for hypoglycemia.  Patient is bedbound and wheelchair-bound.  Husband said she has not been able to walk since he had COVID-19 infection in June 2020.     Diet Order            Diet NPO time specified  Diet effective now                    Consultants:  Nephrologist  Procedures:  None    Medications:   . amiodarone  100 mg Oral Daily  . atorvastatin  40 mg Oral QHS  . Chlorhexidine Gluconate Cloth  6 each Topical Q0600  . dextrose  12.5 g Intravenous STAT  . epoetin (EPOGEN/PROCRIT) injection  4,000 Units Intravenous Once  . feeding supplement (NEPRO CARB STEADY)  237 mL Oral BID BM  . FLUoxetine  20 mg Oral QHS  . folic acid  1 mg Oral Daily  . gabapentin  100 mg Oral QHS  . heparin injection (subcutaneous)  5,000 Units Subcutaneous Q12H  . hydrALAZINE  10 mg Oral BID  . insulin aspart  0-15 Units Subcutaneous TID WC  . insulin aspart  0-5 Units Subcutaneous QHS  . levothyroxine  88 mcg Oral Q0600  .  lidocaine-prilocaine   Topical Once  . pantoprazole  40 mg Oral Daily   Continuous Infusions: . sodium chloride    . ceFAZolin    . [START ON 02/06/2020]  ceFAZolin (ANCEF) IV       Anti-infectives (From admission, onward)   Start     Dose/Rate Route Frequency Ordered Stop   02/06/20 0000  ceFAZolin (ANCEF) IVPB 1 g/50 mL premix       Note to Pharmacy: To be given in specials   1 g 100 mL/hr over 30 Minutes Intravenous  Once 02/05/20 1413     02/05/20 1417  ceFAZolin (ANCEF) 1-4 GM/50ML-% IVPB       Note to Pharmacy: Maynor, Erin   : cabinet override      02/05/20 1417 02/06/20 0229   02/04/20 1515   ceFAZolin (ANCEF) IVPB 1 g/50 mL premix  Status:  Discontinued        1 g 100 mL/hr over 30 Minutes Intravenous  Once 02/04/20 1507 02/04/20 1703             Family Communication/Anticipated D/C date and plan/Code Status   DVT prophylaxis:      Code Status: Full Code  Family Communication: None Disposition Plan:    Status is: Inpatient  Remains inpatient appropriate because:Inpatient level of care appropriate due to severity of illness   Dispo: The patient is from: Home              Anticipated d/c is to: Home              Anticipated d/c date is: 2 days              Patient currently is not medically stable to d/c.                Subjective:   Interval events noted.  Patient is unable to provide any history.  Left arm AV graft could not be used for dialysis today.  Objective:    Vitals:   02/05/20 0321 02/05/20 0736 02/05/20 1214 02/05/20 1412  BP: (!) 144/47 (!) 114/42 (!) 136/51 (!) 155/45  Pulse: (!) 56 (!) 59 61 60  Resp: 14  16 15   Temp: 98.4 F (36.9 C) 98.5 F (36.9 C) 97.8 F (36.6 C) 99.5 F (37.5 C)  TempSrc: Oral Oral  Oral  SpO2: 99% 96%  96%  Weight:    74.8 kg  Height:    5\' 4"  (1.626 m)   No data found.   Intake/Output Summary (Last 24 hours) at 02/05/2020 1429 Last data filed at 02/05/2020 0736 Gross per 24 hour  Intake 0 ml  Output 0 ml  Net 0 ml   Filed Weights   02/02/20 1925 02/04/20 1528 02/05/20 1412  Weight: 74.8 kg 74.8 kg 74.8 kg    Exam:  GEN: No acute distress SKIN: Warm and dry EYES: No pallor or icterus ENT: MMM CV: RRR PULM: CTA B ABD: soft, ND, NT, +BS CNS: Drowsy but arousable.  Oriented to person only.  Tremors of bilateral upper extremities EXT: No edema or tenderness.  No bleeding noted from left AV graft.    Data Reviewed:   I have personally reviewed following labs and imaging studies:  Labs: Labs show the following:   Basic Metabolic Panel: Recent Labs  Lab 02/02/20 1941  02/02/20 1941 02/03/20 0434 02/03/20 0434 02/04/20 2241 02/05/20 0629  NA 139  --  136  --   --  139  K 3.7   < >  3.5   < > 3.7 3.8  CL 98  --  96*  --   --  98  CO2 22  --  24  --   --  22  GLUCOSE 67*  --  117*  --   --  78  BUN 68*  --  70*  --   --  81*  CREATININE 16.46*  --  16.65*  --   --  17.97*  CALCIUM 8.5*  --  8.4*  --   --  8.3*  PHOS  --   --   --   --   --  10.0*   < > = values in this interval not displayed.   GFR Estimated Creatinine Clearance: 3 mL/min (A) (by C-G formula based on SCr of 17.97 mg/dL (H)). Liver Function Tests: Recent Labs  Lab 02/05/20 0629  ALBUMIN 2.3*   No results for input(s): LIPASE, AMYLASE in the last 168 hours. No results for input(s): AMMONIA in the last 168 hours. Coagulation profile No results for input(s): INR, PROTIME in the last 168 hours.  CBC: Recent Labs  Lab 02/02/20 1941 02/03/20 0434  WBC 6.1 6.3  NEUTROABS 5.0  --   HGB 8.9* 8.2*  HCT 27.6* 25.8*  MCV 90.8 91.2  PLT 138* 130*   Cardiac Enzymes: No results for input(s): CKTOTAL, CKMB, CKMBINDEX, TROPONINI in the last 168 hours. BNP (last 3 results) No results for input(s): PROBNP in the last 8760 hours. CBG: Recent Labs  Lab 02/04/20 1628 02/04/20 2112 02/05/20 0741 02/05/20 1215 02/05/20 1252  GLUCAP 77 79 73 66* 110*   D-Dimer: No results for input(s): DDIMER in the last 72 hours. Hgb A1c: Recent Labs    02/03/20 0434  HGBA1C 4.6*   Lipid Profile: No results for input(s): CHOL, HDL, LDLCALC, TRIG, CHOLHDL, LDLDIRECT in the last 72 hours. Thyroid function studies: No results for input(s): TSH, T4TOTAL, T3FREE, THYROIDAB in the last 72 hours.  Invalid input(s): FREET3 Anemia work up: No results for input(s): VITAMINB12, FOLATE, FERRITIN, TIBC, IRON, RETICCTPCT in the last 72 hours. Sepsis Labs: Recent Labs  Lab 02/02/20 1941 02/03/20 0434  WBC 6.1 6.3    Microbiology Recent Results (from the past 240 hour(s))  Resp Panel by RT-PCR  (Flu A&B, Covid) Nasopharyngeal Swab     Status: None   Collection Time: 02/02/20  7:36 PM   Specimen: Nasopharyngeal Swab; Nasopharyngeal(NP) swabs in vial transport medium  Result Value Ref Range Status   SARS Coronavirus 2 by RT PCR NEGATIVE NEGATIVE Final    Comment: (NOTE) SARS-CoV-2 target nucleic acids are NOT DETECTED.  The SARS-CoV-2 RNA is generally detectable in upper respiratory specimens during the acute phase of infection. The lowest concentration of SARS-CoV-2 viral copies this assay can detect is 138 copies/mL. A negative result does not preclude SARS-Cov-2 infection and should not be used as the sole basis for treatment or other patient management decisions. A negative result may occur with  improper specimen collection/handling, submission of specimen other than nasopharyngeal swab, presence of viral mutation(s) within the areas targeted by this assay, and inadequate number of viral copies(<138 copies/mL). A negative result must be combined with clinical observations, patient history, and epidemiological information. The expected result is Negative.  Fact Sheet for Patients:  EntrepreneurPulse.com.au  Fact Sheet for Healthcare Providers:  IncredibleEmployment.be  This test is no t yet approved or cleared by the [place] FDA and  has been authorized for detection and/or diagnosis of SARS-CoV-2 by FDA  under an Emergency Use Authorization (EUA). This EUA will remain  in effect (meaning this test can be used) for the duration of the COVID-19 declaration under Section 564(b)(1) of the Act, 21 U.S.C.section 360bbb-3(b)(1), unless the authorization is terminated  or revoked sooner.       Influenza A by PCR NEGATIVE NEGATIVE Final   Influenza B by PCR NEGATIVE NEGATIVE Final    Comment: (NOTE) The Xpert Xpress SARS-CoV-2/FLU/RSV plus assay is intended as an aid in the diagnosis of influenza from Nasopharyngeal swab specimens  and should not be used as a sole basis for treatment. Nasal washings and aspirates are unacceptable for Xpert Xpress SARS-CoV-2/FLU/RSV testing.  Fact Sheet for Patients: EntrepreneurPulse.com.au  Fact Sheet for Healthcare Providers: IncredibleEmployment.be  This test is not yet approved or cleared by the [place] FDA and has been authorized for detection and/or diagnosis of SARS-CoV-2 by FDA under an Emergency Use Authorization (EUA). This EUA will remain in effect (meaning this test can be used) for the duration of the COVID-19 declaration under Section 564(b)(1) of the Act, 21 U.S.C. section 360bbb-3(b)(1), unless the authorization is terminated or revoked.  Performed at [facility], [address] [place], [place] [address]   MRSA PCR Screening     Status: None   Collection Time: 02/03/20  1:45 PM   Specimen: Nasopharyngeal  Result Value Ref Range Status   MRSA by PCR NEGATIVE NEGATIVE Final    Comment:        The GeneXpert MRSA Assay (FDA approved for NASAL specimens only), is one component of a comprehensive MRSA colonization surveillance program. It is not intended to diagnose MRSA infection nor to guide or monitor treatment for MRSA infections. Performed at [facility], [address] [place], [place] [address]     Procedures and diagnostic studies:  PERIPHERAL VASCULAR CATHETERIZATION  Result Date: 02/04/2020 See op note              LOS: 2 days   Matthan Sledge  [facility]   Pager on [email or web address]. If 7PM-7AM, please contact night-coverage at [email or web address]     02/05/2020, 2:29 PM

## 2020-02-05 NOTE — Care Management Important Message (Signed)
Important Message  Patient Details  Name: Jamie Arias MRN: [id number] Date of Birth: Dec 27, 1955   Medicare Important Message Given:  N/A - LOS <3 / Initial given by admissions  Initial Medicare IM reviewed with Ramiro Harvest, spouse, by Meredith Mody, Patient Access Associate on 02/05/2020 at 9:22am.   Dannette Barbara 02/05/2020, 11:54 AM

## 2020-02-05 NOTE — Plan of Care (Addendum)
Pt has been mostly lethargic but able to answer questions yes or no appropriately. Opened eyes at times. Pt became progressively more awake and talkative overnight. Dependent of all care. Q 2 turns performed. HR from Sinus brady to controlled Afib in the 50-60's HR. Safety measures in place. Will continue to monitor.  Problem: Education: Goal: Knowledge of General Education information will improve Description: Including pain rating scale, medication(s)/side effects and non-pharmacologic comfort measures Outcome: Progressing   Problem: Health Behavior/Discharge Planning: Goal: Ability to manage health-related needs will improve Outcome: Progressing   Problem: Clinical Measurements: Goal: Ability to maintain clinical measurements within normal limits will improve Outcome: Progressing Goal: Will remain free from infection Outcome: Progressing Goal: Diagnostic test results will improve Outcome: Progressing Goal: Respiratory complications will improve Outcome: Progressing Goal: Cardiovascular complication will be avoided Outcome: Progressing   Problem: Activity: Goal: Risk for activity intolerance will decrease Outcome: Progressing   Problem: Nutrition: Goal: Adequate nutrition will be maintained Outcome: Progressing   Problem: Coping: Goal: Level of anxiety will decrease Outcome: Progressing   Problem: Elimination: Goal: Will not experience complications related to bowel motility Outcome: Progressing Goal: Will not experience complications related to urinary retention Outcome: Progressing   Problem: Pain Managment: Goal: General experience of comfort will improve Outcome: Progressing   Problem: Safety: Goal: Ability to remain free from injury will improve Outcome: Progressing   Problem: Skin Integrity: Goal: Risk for impaired skin integrity will decrease Outcome: Progressing

## 2020-02-05 NOTE — Progress Notes (Signed)
[facility] Daily Progress Note   Subjective: 02/04/20: 1.  Left brachial artery to axillary vein arteriovenous graft cannulation under ultrasound guidance 2.  Left arm shuntogram 3.  Percutaneous transluminal angioplasty of the left axillary vein and venous anastomosis with 7 mm diameter by 6 cm length Lutonix drug-coated angioplasty balloon 4.  Viabahn stent placement to the venous anastomosis and axillary vein with 8 mm diameter by 5 cm length stent  Patient is lethargic this AM.  No issues overnight.  No continued bleeding.  Objective: Vitals:   02/04/20 1935 02/04/20 2320 02/05/20 0321 02/05/20 0736  BP: (!) 143/41 (!) 156/50 (!) 144/47 (!) 114/42  Pulse: 63 (!) 57 (!) 56 (!) 59  Resp: 14 14 14    Temp: 97.9 F (36.6 C) 98 F (36.7 C) 98.4 F (36.9 C) 98.5 F (36.9 C)  TempSrc: Oral Oral Oral Oral  SpO2: 98% 100% 99% 96%  Weight:      Height:        Intake/Output Summary (Last 24 hours) at 02/05/2020 1112 Last data filed at 02/05/2020 0736 Gross per 24 hour  Intake 0 ml  Output 0 ml  Net 0 ml    Physical Exam: A&Ox3, NAD CV: RRR Pulmonary: CTA Bilaterally Abdomen: Soft, Nontender, Nondistended Vascular:  Left upper extremity: Soft.  Warm to fingers.  No active bleeding noted.  Good bruit and thrill in graft.   Laboratory: CBC    Component Value Date/Time   WBC 6.3 02/03/2020 0434   HGB 8.2 (L) 02/03/2020 0434   HGB 10.8 (L) 06/23/2019 1600   HCT 25.8 (L) 02/03/2020 0434   HCT 35.9 06/23/2019 1600   PLT 130 (L) 02/03/2020 0434   PLT 194 06/23/2019 1600   BMET    Component Value Date/Time   NA 139 02/05/2020 0629   NA 138 06/23/2019 1600   NA 140 03/30/2014 1611   K 3.8 02/05/2020 0629   K 4.3 03/30/2014 1611   CL 98 02/05/2020 0629   CL 105 03/30/2014 1611   CO2 22 02/05/2020 0629   CO2 29 03/30/2014 1611   GLUCOSE 78 02/05/2020 0629   GLUCOSE 187 (H) 03/30/2014 1611   BUN 81 (H) 02/05/2020 0629   BUN 21 06/23/2019 1600    BUN 21 (H) 03/30/2014 1611   CREATININE 17.97 (H) 02/05/2020 0629   CREATININE 3.14 (H) 09/09/2015 1534   CALCIUM 8.3 (L) 02/05/2020 0629   CALCIUM 9.0 03/30/2014 1611   GFRNONAA 2 (L) 02/05/2020 0629   GFRNONAA 36 (L) 03/30/2014 1611   GFRNONAA >60 07/23/2013 0741   GFRAA 12 (L) 10/14/2019 0544   GFRAA 44 (L) 03/30/2014 1611   GFRAA >60 07/23/2013 0741   Assessment/Planning: The patient is a 64 year old female status post emergent shuntogram with intervention for a occluded venous outflow / bleeding from access - POD#1  1) no further bleeding noted from access at this time. 2) okay to use graft again for dialysis  Discussed with Dr. Ellis Parents [facility] PA-C 02/05/2020 11:12 AM

## 2020-02-05 NOTE — Op Note (Signed)
OPERATIVE NOTE    PRE-OPERATIVE DIAGNOSIS: 1. ESRD 2. Non-functional AVG  POST-OPERATIVE DIAGNOSIS: same as above  PROCEDURE: 1. Ultrasound guidance for vascular access to the right femoral  vein 2. Fluoroscopic guidance for placement of catheter 3. Placement of a 33 cm tip to cuff tunneled hemodialysis catheter via the right femoral vein  SURGEON: Leotis Pain, MD  ANESTHESIA:  Local with moderate conscious sedation for approximately 18 minutes using 1 mg of Versed and 25 mcg of Fentanyl  ESTIMATED BLOOD LOSS: 5 cc  FINDING(S): 1.  Patent right femoral vein, right jugular vein appeared occluded  SPECIMEN(S):  None  INDICATIONS:   Patient is a 63 y.o.female who presents with renal failure and inability to use her AVG.   The patient needs long term dialysis access for their ESRD, and a Permcath is necessary.  Risks and benefits are discussed and informed consent is obtained.    DESCRIPTION: After obtaining full informed written consent, the patient was brought back to the vascular suited. Moderate conscious sedation was administered throughout the procedure with a face-to-face encounter with my presence for the entire procedure and with my supervision of the RN monitoring the patient's vital signs, pulse oximetry, telemetry, and mental status throughout the procedure. The right jugular vein appeared occluded on [place] and her neck was contracted to the left, so we elected to go the groin. The patient's right groin was sterilely prepped and draped and a sterile surgical field was created.  The right femoral vein was visualized with ultrasound and found to be patent. It was then accessed under direct ultrasound guidance and a permanent image was recorded. A wire was placed. After skin nick and dilatation, the peel-away sheath was placed over the wire. I then turned my attention to an area about 5 cm inferior and lateral to the access incision and a small counterincision was created.  I tunneled  from the counter  incision to the access site. Using fluoroscopic guidance, a 33 centimeterer tip to cuff tunneled hemodialysis catheter was selected, and tunneled from the counter  incision to the access site. It was then placed through the peel-away sheath and the peel-away sheath was removed. Using fluoroscopic guidance the catheter tips were parked in the IVC around the level of the renal veins. The appropriate distal connectors were placed. It withdrew blood well and flushed easily with heparinized saline and a concentrated heparin solution was then placed. It was secured to the leg  with 2 Prolene sutures. The access incision was closed single 4-0 Monocryl. A 4-0 Monocryl pursestring suture was placed around the exit site. Sterile dressings were placed. The patient tolerated the procedure well and was taken to the recovery room in stable condition.  COMPLICATIONS: None  CONDITION: Stable    Leotis Pain 02/05/2020 3:22 PM   This note was created with Dragon Medical transcription system. Any errors in dictation are purely unintentional.

## 2020-02-05 NOTE — Progress Notes (Signed)
[facility], [place] 02/05/20  Subjective:   LOS: 2 Patient underwent left arm shuntogram and stent placement of her Left AVG yesterday.   Seen in dialysis unit today,again having trouble with cannulation today.Will discuss with vascular team regarding Permcath placement.  Objective:  Vital signs in last 24 hours:  Temp:  [97.8 F (36.6 C)-98.8 F (37.1 C)] 97.8 F (36.6 C) (12/03 1214) Pulse Rate:  [56-67] 61 (12/03 1214) Resp:  [12-17] 16 (12/03 1214) BP: (114-184)/(41-80) 136/51 (12/03 1214) SpO2:  [96 %-100 %] 96 % (12/03 0736) Weight:  [74.8 kg] 74.8 kg (12/02 1528)  Weight change:  Filed Weights   02/02/20 1925 02/04/20 1528  Weight: 74.8 kg 74.8 kg    Intake/Output:    Intake/Output Summary (Last 24 hours) at 02/05/2020 1345 Last data filed at 02/05/2020 0736 Gross per 24 hour  Intake 0 ml  Output 0 ml  Net 0 ml    Physical Exam: General:  No acute distress, resting in bed  HEENT  Oral mucous membrane moist  Pulm/lungs  lungs are clear to auscultation, Respirations even,unlabored  CVS/Heart  S1S2, no rubs or gallop  Abdomen:   Soft, non tender,non distended  Extremities:  No peripheral edema  Neurologic: lethargic, arousable to call  Skin:  No acute rashes  Left arm AVG      Basic Metabolic Panel:  Recent Labs  Lab 02/02/20 1941 02/03/20 0434 02/04/20 2241 02/05/20 0629  NA 139 136  --  139  K 3.7 3.5 3.7 3.8  CL 98 96*  --  98  CO2 22 24  --  22  GLUCOSE 67* 117*  --  78  BUN 68* 70*  --  81*  CREATININE 16.46* 16.65*  --  17.97*  CALCIUM 8.5* 8.4*  --  8.3*  PHOS  --   --   --  10.0*     CBC: Recent Labs  Lab 02/02/20 1941 02/03/20 0434  WBC 6.1 6.3  NEUTROABS 5.0  --   HGB 8.9* 8.2*  HCT 27.6* 25.8*  MCV 90.8 91.2  PLT 138* 130*      Lab Results  Component Value Date   HEPBSAG NON REACTIVE 10/08/2019      Microbiology:  Recent Results (from the past 240 hour(s))  Resp Panel by RT-PCR  (Flu A&B, Covid) Nasopharyngeal Swab     Status: None   Collection Time: 02/02/20  7:36 PM   Specimen: Nasopharyngeal Swab; Nasopharyngeal(NP) swabs in vial transport medium  Result Value Ref Range Status   SARS Coronavirus 2 by RT PCR NEGATIVE NEGATIVE Final    Comment: (NOTE) SARS-CoV-2 target nucleic acids are NOT DETECTED.  The SARS-CoV-2 RNA is generally detectable in upper respiratory specimens during the acute phase of infection. The lowest concentration of SARS-CoV-2 viral copies this assay can detect is 138 copies/mL. A negative result does not preclude SARS-Cov-2 infection and should not be used as the sole basis for treatment or other patient management decisions. A negative result may occur with  improper specimen collection/handling, submission of specimen other than nasopharyngeal swab, presence of viral mutation(s) within the areas targeted by this assay, and inadequate number of viral copies(<138 copies/mL). A negative result must be combined with clinical observations, patient history, and epidemiological information. The expected result is Negative.  Fact Sheet for Patients:  EntrepreneurPulse.com.au  Fact Sheet for Healthcare Providers:  IncredibleEmployment.be  This test is no t yet approved or cleared by the [place] FDA and  has  been authorized for detection and/or diagnosis of SARS-CoV-2 by FDA under an Emergency Use Authorization (EUA). This EUA will remain  in effect (meaning this test can be used) for the duration of the COVID-19 declaration under Section 564(b)(1) of the Act, 21 U.S.C.section 360bbb-3(b)(1), unless the authorization is terminated  or revoked sooner.       Influenza A by PCR NEGATIVE NEGATIVE Final   Influenza B by PCR NEGATIVE NEGATIVE Final    Comment: (NOTE) The Xpert Xpress SARS-CoV-2/FLU/RSV plus assay is intended as an aid in the diagnosis of influenza from Nasopharyngeal swab specimens  and should not be used as a sole basis for treatment. Nasal washings and aspirates are unacceptable for Xpert Xpress SARS-CoV-2/FLU/RSV testing.  Fact Sheet for Patients: EntrepreneurPulse.com.au  Fact Sheet for Healthcare Providers: IncredibleEmployment.be  This test is not yet approved or cleared by the [place] FDA and has been authorized for detection and/or diagnosis of SARS-CoV-2 by FDA under an Emergency Use Authorization (EUA). This EUA will remain in effect (meaning this test can be used) for the duration of the COVID-19 declaration under Section 564(b)(1) of the Act, 21 U.S.C. section 360bbb-3(b)(1), unless the authorization is terminated or revoked.  Performed at [facility], [address] [place], [place] [address]   MRSA PCR Screening     Status: None   Collection Time: 02/03/20  1:45 PM   Specimen: Nasopharyngeal  Result Value Ref Range Status   MRSA by PCR NEGATIVE NEGATIVE Final    Comment:        The GeneXpert MRSA Assay (FDA approved for NASAL specimens only), is one component of a comprehensive MRSA colonization surveillance program. It is not intended to diagnose MRSA infection nor to guide or monitor treatment for MRSA infections. Performed at [facility], [address] [place], [place] [address]     Coagulation Studies: No results for input(s): LABPROT, INR in the last 72 hours.  Urinalysis: No results for input(s): COLORURINE, LABSPEC, PHURINE, GLUCOSEU, HGBUR, BILIRUBINUR, KETONESUR, PROTEINUR, UROBILINOGEN, NITRITE, LEUKOCYTESUR in the last 72 hours.  Invalid input(s): APPERANCEUR    Imaging: PERIPHERAL VASCULAR CATHETERIZATION  Result Date: 02/04/2020 See op note    Medications:    . amiodarone  100 mg Oral Daily  . atorvastatin  40 mg Oral QHS  . Chlorhexidine Gluconate Cloth  6 each Topical Q0600  . dextrose  12.5 g Intravenous STAT  . epoetin (EPOGEN/PROCRIT)  injection  4,000 Units Intravenous Once  . feeding supplement (NEPRO CARB STEADY)  237 mL Oral BID BM  . FLUoxetine  20 mg Oral QHS  . folic acid  1 mg Oral Daily  . gabapentin  100 mg Oral QHS  . heparin injection (subcutaneous)  5,000 Units Subcutaneous Q12H  . hydrALAZINE  10 mg Oral BID  . insulin aspart  0-15 Units Subcutaneous TID WC  . insulin aspart  0-5 Units Subcutaneous QHS  . levothyroxine  88 mcg Oral Q0600  . lidocaine-prilocaine   Topical Once  . pantoprazole  40 mg Oral Daily   acetaminophen **OR** acetaminophen, fluticasone, heparin, melatonin, ondansetron **OR** ondansetron (ZOFRAN) IV  Assessment/ Plan:  64 y.o. female with  was admitted on 02/02/2020 for  Principal Problem:   Acute metabolic encephalopathy Active Problems:   Hypothyroid   HTN (hypertension)   Type 2 diabetes mellitus with ESRD (end-stage renal disease) (HCC)   Coronary artery disease   Atrial fibrillation, chronic (HCC)   Depression   Chronic diastolic CHF (congestive heart failure) ([place])  ESRD (end stage renal disease) ([place])   Anemia of chronic renal failure, stage 5 (HCC)   Hypoglycemia   Functional quadriplegia (HCC)  ESRD on dialysis ([place]) [N18.6, Z99.2] AMS (altered mental status) [V07.46] Acute metabolic encephalopathy [A02.98]  #. ESRD Unable to dialyze patient today as her access was not working Discussed with vascular team We will plan for PermCath placement and dialysis after the access is established    # Complication of Dialysis access Patient underwent fistulogram and stent placement of left AV graft yesterday Issues with cannulation again today We will plan for PermCath  #. Anemia of CKD  Lab Results  Component Value Date   HGB 8.2 (L) 02/03/2020   Continue Epogen with hemodialysis  #. Secondary hyperparathyroidism of renal origin N 25.81      Component Value Date/Time   PTH 83 (H) 10/09/2019 0847   Lab Results  Component Value Date   PHOS 10.0 (H)  02/05/2020   We will continue monitoring bone mineral metabolism parameters   #. Diabetes type 2 with CKD Hemoglobin A1C (no units)  Date Value  08/16/2017 7.1   Hgb A1c MFr Bld (%)  Date Value  02/03/2020 4.6 (L)  Blood glucose readings within low normal range    LOS: 2 Jamie Arias 12/3/20211:45 PM  [facility] [place], [place]

## 2020-02-06 DIAGNOSIS — I482 Chronic atrial fibrillation, unspecified: Secondary | ICD-10-CM

## 2020-02-06 LAB — GLUCOSE, CAPILLARY
Glucose-Capillary: 96 mg/dL (ref 70–99)
Glucose-Capillary: 85 mg/dL (ref 70–99)
Glucose-Capillary: 93 mg/dL (ref 70–99)

## 2020-02-06 MED ORDER — EPOETIN ALFA 10000 UNIT/ML IJ SOLN: 10000.0000 [IU] | INTRAMUSCULAR | Status: DC

## 2020-02-06 MED ORDER — HEPARIN SODIUM (PORCINE) 5000 UNIT/ML IJ SOLN
5000.0000 [IU] | Freq: Two times a day (BID) | INTRAMUSCULAR | Status: DC
  Administered 2020-02-10 – 2020-02-13 (×5): 5000 [IU] via SUBCUTANEOUS
  Filled 2020-02-06 (×8): qty 1

## 2020-02-06 MED ORDER — EPOETIN ALFA 10000 UNIT/ML IJ SOLN
10000.0000 [IU] | INTRAMUSCULAR | Status: DC
  Administered 2020-02-06 – 2020-02-08 (×2): 10000 [IU] via INTRAVENOUS
  Filled 2020-02-06: qty 1

## 2020-02-06 NOTE — Progress Notes (Addendum)
Report given to Verdis Frederickson, [place]. Informed about bleeding from IV  Site from the right lower arm.

## 2020-02-06 NOTE — Progress Notes (Signed)
Progress Note    Jamie Arias  JGO:[id number] DOB: 1955-06-24  DOA: 02/02/2020 PCP: [name], MD      Brief Narrative:    Medical records reviewed and are as summarized below:  Jamie Arias is a 64 y.o. female with medical history significant for ESRD on HD MWF with anemia of CKD, HTN, CAD, A. fib, hypothyroidism, depression, chronic diastolic heart failure who was referred to the ED from the dialysis center after having missed 5 dialysis sessions.  Patient is wheelchair and bedbound and total care dependent for ADLs though can feed herself.  Her husband is her caregiver.  She said she missed 5 dialysis sessions because she just did not feel like going to an outpatient dialysis center.  She has been eating less and sleeping more.  She was admitted to the hospital for acute metabolic/uremic encephalopathy likely due to missed hemodialysis sessions.    Assessment/Plan:   Principal Problem:   Acute metabolic encephalopathy Active Problems:   Hypothyroid   HTN (hypertension)   Type 2 diabetes mellitus with ESRD (end-stage renal disease) (HCC)   Coronary artery disease   Atrial fibrillation, chronic (HCC)   Depression   Chronic diastolic CHF (congestive heart failure) (HCC)   ESRD (end stage renal disease) (HCC)   Anemia of chronic renal failure, stage 5 (HCC)   Hypoglycemia   Functional quadriplegia (HCC)    Body mass index is 25.85 kg/m.    Acute toxic metabolic/uremic encephalopathy due to missed hemodialysis, ESRD, malfunctioning  with recent bleeding bleeding left brachial AV graft:   Left arm AV graft access is still dysfunctional and it cannot be used for hemodialysis.  S/p left arm shuntogram with stent placement left AV graft on 02/04/2028.  S/p right femoral permacath on 02/05/2020.  She is having hemodialysis today.   Excessive bleeding from right arm peripheral IV site today: Pressure was applied to the bleeding site on the hemodialysis unit by  her nurse.  Bleeding has resolved.  Heparin has been held.  Hypertension: Continue hydralazine  CAD, chronic diastolic CHF, paroxysmal atrial fibrillation: No acute issues. Continue Lipitor and amiodarone.  She has been off of Eliquis because of history of GI bleed and severe anemia.  Anemia of chronic disease: H&H is stable.  Continue Epogen.  Type II DM with recurrent hypoglycemia, poor oral intake: Hemoglobin A1c is 4.6. Monitor glucose levels closely.  IV dextrose as needed for hypoglycemia.  Encourage adequate oral intake.  Patient is bedbound and wheelchair-bound.  Husband said she has not been able to walk since he had COVID-19 infection in June 2020.     Diet Order            Diet renal with fluid restriction Fluid restriction: 1200 mL Fluid; Room service appropriate? Yes; Fluid consistency: Thin  Diet effective now                    Consultants:  Nephrologist  Vascular surgeon  Procedures:  Left arm shuntogram with stent placement to AV graft on 02/04/2020  Placement of tunneled hemodialysis catheter via right femoral vein on 02/05/2020    Medications:   . amiodarone  100 mg Oral Daily  . atorvastatin  40 mg Oral QHS  . Chlorhexidine Gluconate Cloth  6 each Topical Q0600  . dextrose  12.5 g Intravenous STAT  . feeding supplement (NEPRO CARB STEADY)  237 mL Oral BID BM  . FLUoxetine  20 mg Oral QHS  .  folic acid  1 mg Oral Daily  . gabapentin  100 mg Oral QHS  . [START ON 02/07/2020] heparin injection (subcutaneous)  5,000 Units Subcutaneous Q12H  . hydrALAZINE  10 mg Oral BID  . insulin aspart  0-15 Units Subcutaneous TID WC  . insulin aspart  0-5 Units Subcutaneous QHS  . levothyroxine  88 mcg Oral Q0600  . lidocaine-prilocaine   Topical Once  . pantoprazole  40 mg Oral Daily   Continuous Infusions:    Anti-infectives (From admission, onward)   Start     Dose/Rate Route Frequency Ordered Stop   02/06/20 0000  ceFAZolin (ANCEF) IVPB 1 g/50 mL  premix       Note to Pharmacy: To be given in specials   1 g 100 mL/hr over 30 Minutes Intravenous  Once 02/05/20 1413 02/05/20 1624   02/05/20 1417  ceFAZolin (ANCEF) 1-4 GM/50ML-% IVPB       Note to Pharmacy: Maynor, Erin   : cabinet override      02/05/20 1417 02/05/20 1624   02/04/20 1515  ceFAZolin (ANCEF) IVPB 1 g/50 mL premix  Status:  Discontinued        1 g 100 mL/hr over 30 Minutes Intravenous  Once 02/04/20 1507 02/04/20 1703             Family Communication/Anticipated D/C date and plan/Code Status   DVT prophylaxis:      Code Status: Full Code  Family Communication: None Disposition Plan:    Status is: Inpatient  Remains inpatient appropriate because:Inpatient level of care appropriate due to severity of illness   Dispo: The patient is from: Home              Anticipated d/c is to: Home              Anticipated d/c date is: 2 days              Patient currently is not medically stable to d/c.                Subjective:   Interval events noted.  She bled from her right arm after peripheral IV was removed.  Objective:    Vitals:   02/06/20 1200 02/06/20 1215 02/06/20 1230 02/06/20 1245  BP: (!) 134/40 (!) 128/45 (!) 124/40 (!) 132/40  Pulse: 64 64 64 65  Resp: 14 16 15 18   Temp:      TempSrc:      SpO2: 99% 99% 93% (!) 89%  Weight:      Height:       No data found.   Intake/Output Summary (Last 24 hours) at 02/06/2020 1251 Last data filed at 02/06/2020 0900 Gross per 24 hour  Intake 270 ml  Output 500 ml  Net -230 ml   Filed Weights   02/04/20 1528 02/05/20 1412 02/06/20 0500  Weight: 74.8 kg 74.8 kg 68.3 kg    Exam:  GEN: NAD SKIN: Warm and dry EYES: No pallor or icterus ENT: MMM CV: RRR PULM: CTA B ABD: soft, ND, NT, +BS CNS: Drowsy but arousable.  Oriented to person only.  Tremors of bilateral upper extremities. EXT: No edema or tenderness    Data Reviewed:   I have personally reviewed following labs  and imaging studies:  Labs: Labs show the following:   Basic Metabolic Panel: Recent Labs  Lab 02/02/20 1941 02/02/20 1941 02/03/20 0434 02/03/20 0434 02/04/20 2241 02/05/20 0629  NA 139  --  136  --   --  139  K 3.7   < > 3.5   < > 3.7 3.8  CL 98  --  96*  --   --  98  CO2 22  --  24  --   --  22  GLUCOSE 67*  --  117*  --   --  78  BUN 68*  --  70*  --   --  81*  CREATININE 16.46*  --  16.65*  --   --  17.97*  CALCIUM 8.5*  --  8.4*  --   --  8.3*  PHOS  --   --   --   --   --  10.0*   < > = values in this interval not displayed.   GFR Estimated Creatinine Clearance: 2.9 mL/min (A) (by C-G formula based on SCr of 17.97 mg/dL (H)). Liver Function Tests: Recent Labs  Lab 02/05/20 0629  ALBUMIN 2.3*   No results for input(s): LIPASE, AMYLASE in the last 168 hours. No results for input(s): AMMONIA in the last 168 hours. Coagulation profile No results for input(s): INR, PROTIME in the last 168 hours.  CBC: Recent Labs  Lab 02/02/20 1941 02/03/20 0434  WBC 6.1 6.3  NEUTROABS 5.0  --   HGB 8.9* 8.2*  HCT 27.6* 25.8*  MCV 90.8 91.2  PLT 138* 130*   Cardiac Enzymes: No results for input(s): CKTOTAL, CKMB, CKMBINDEX, TROPONINI in the last 168 hours. BNP (last 3 results) No results for input(s): PROBNP in the last 8760 hours. CBG: Recent Labs  Lab 02/05/20 1252 02/05/20 1651 02/05/20 2149 02/05/20 2216 02/06/20 0754  GLUCAP 110* 76 59* 122* 96   D-Dimer: No results for input(s): DDIMER in the last 72 hours. Hgb A1c: No results for input(s): HGBA1C in the last 72 hours. Lipid Profile: No results for input(s): CHOL, HDL, LDLCALC, TRIG, CHOLHDL, LDLDIRECT in the last 72 hours. Thyroid function studies: No results for input(s): TSH, T4TOTAL, T3FREE, THYROIDAB in the last 72 hours.  Invalid input(s): FREET3 Anemia work up: No results for input(s): VITAMINB12, FOLATE, FERRITIN, TIBC, IRON, RETICCTPCT in the last 72 hours. Sepsis Labs: Recent Labs  Lab  02/02/20 1941 02/03/20 0434  WBC 6.1 6.3    Microbiology Recent Results (from the past 240 hour(s))  Resp Panel by RT-PCR (Flu A&B, Covid) Nasopharyngeal Swab     Status: None   Collection Time: 02/02/20  7:36 PM   Specimen: Nasopharyngeal Swab; Nasopharyngeal(NP) swabs in vial transport medium  Result Value Ref Range Status   SARS Coronavirus 2 by RT PCR NEGATIVE NEGATIVE Final    Comment: (NOTE) SARS-CoV-2 target nucleic acids are NOT DETECTED.  The SARS-CoV-2 RNA is generally detectable in upper respiratory specimens during the acute phase of infection. The lowest concentration of SARS-CoV-2 viral copies this assay can detect is 138 copies/mL. A negative result does not preclude SARS-Cov-2 infection and should not be used as the sole basis for treatment or other patient management decisions. A negative result may occur with  improper specimen collection/handling, submission of specimen other than nasopharyngeal swab, presence of viral mutation(s) within the areas targeted by this assay, and inadequate number of viral copies(<138 copies/mL). A negative result must be combined with clinical observations, patient history, and epidemiological information. The expected result is Negative.  Fact Sheet for Patients:  EntrepreneurPulse.com.au  Fact Sheet for Healthcare Providers:  IncredibleEmployment.be  This test is no t yet approved or cleared by the [place] FDA and  has been authorized for detection  and/or diagnosis of SARS-CoV-2 by FDA under an Emergency Use Authorization (EUA). This EUA will remain  in effect (meaning this test can be used) for the duration of the COVID-19 declaration under Section 564(b)(1) of the Act, 21 U.S.C.section 360bbb-3(b)(1), unless the authorization is terminated  or revoked sooner.       Influenza A by PCR NEGATIVE NEGATIVE Final   Influenza B by PCR NEGATIVE NEGATIVE Final    Comment: (NOTE) The  Xpert Xpress SARS-CoV-2/FLU/RSV plus assay is intended as an aid in the diagnosis of influenza from Nasopharyngeal swab specimens and should not be used as a sole basis for treatment. Nasal washings and aspirates are unacceptable for Xpert Xpress SARS-CoV-2/FLU/RSV testing.  Fact Sheet for Patients: EntrepreneurPulse.com.au  Fact Sheet for Healthcare Providers: IncredibleEmployment.be  This test is not yet approved or cleared by the [place] FDA and has been authorized for detection and/or diagnosis of SARS-CoV-2 by FDA under an Emergency Use Authorization (EUA). This EUA will remain in effect (meaning this test can be used) for the duration of the COVID-19 declaration under Section 564(b)(1) of the Act, 21 U.S.C. section 360bbb-3(b)(1), unless the authorization is terminated or revoked.  Performed at [facility], [address] [place], [place] [address]   MRSA PCR Screening     Status: None   Collection Time: 02/03/20  1:45 PM   Specimen: Nasopharyngeal  Result Value Ref Range Status   MRSA by PCR NEGATIVE NEGATIVE Final    Comment:        The GeneXpert MRSA Assay (FDA approved for NASAL specimens only), is one component of a comprehensive MRSA colonization surveillance program. It is not intended to diagnose MRSA infection nor to guide or monitor treatment for MRSA infections. Performed at [facility], [address] [place], [place] [address]     Procedures and diagnostic studies:  PERIPHERAL VASCULAR CATHETERIZATION  Result Date: 02/05/2020 See op note  PERIPHERAL VASCULAR CATHETERIZATION  Result Date: 02/04/2020 See op note              LOS: 3 days   Mindi Akerson  [facility]   Pager on [email or web address]. If 7PM-7AM, please contact night-coverage at [email or web address]     02/06/2020, 12:51 PM

## 2020-02-06 NOTE — Progress Notes (Addendum)
Pt arrived in Dialysis with bleeding right lower arm where her IV had been removed. Pressure was held and floor nurse, came changed dressing out and applied pressure for another 10 mins.  Bedding changed. APPROX 50cc blood loos.   IV site was bleeding last night during dialysis and reported to floor nurse at 10:00pm. No blood loss noted last night  Dressing secure, and will continue to monitor.  Jeanett Schlein, RN

## 2020-02-06 NOTE — Progress Notes (Signed)
[facility], [place] 02/06/20  Subjective:   LOS: 3 Patient appears very lethargic today, attempts to open her eyes to call.  She received dialysis treatment yesterday with ultrafiltration of 500 ml.  We are planning for dialysis again today  Objective:  Vital signs in last 24 hours:  Temp:  [98.8 F (37.1 C)-99.9 F (37.7 C)] 99.9 F (37.7 C) (12/04 1100) Pulse Rate:  [60-66] 65 (12/04 1245) Resp:  [13-24] 18 (12/04 1245) BP: (106-184)/(39-86) 132/40 (12/04 1245) SpO2:  [89 %-100 %] 89 % (12/04 1245) Weight:  [68.3 kg-74.8 kg] 68.3 kg (12/04 0500)  Weight change: 0 kg Filed Weights   02/04/20 1528 02/05/20 1412 02/06/20 0500  Weight: 74.8 kg 74.8 kg 68.3 kg    Intake/Output:    Intake/Output Summary (Last 24 hours) at 02/06/2020 1331 Last data filed at 02/06/2020 0900 Gross per 24 hour  Intake 270 ml  Output 500 ml  Net -230 ml    Physical Exam: General:  Lethargic  HEENT  normocephalic, atraumatic, oral mucous membrane dry  Pulm/lungs  respirations unlabored, lungs clear, but diminished at the bases  CVS/Heart  S1-S2, no rubs or gallops  Abdomen:   Soft, non tender,non distended  Extremities:  No peripheral edema  Neurologic: hardly arousable to call, attempts to open eyes when name called  Skin:  No acute lesions or rashes  Left arm AVG Right femoral tunneled catheter by Dr. Lucky Cowboy on 02/05/2020      Basic Metabolic Panel:  Recent Labs  Lab 02/02/20 1941 02/03/20 0434 02/04/20 2241 02/05/20 0629  NA 139 136  --  139  K 3.7 3.5 3.7 3.8  CL 98 96*  --  98  CO2 22 24  --  22  GLUCOSE 67* 117*  --  78  BUN 68* 70*  --  81*  CREATININE 16.46* 16.65*  --  17.97*  CALCIUM 8.5* 8.4*  --  8.3*  PHOS  --   --   --  10.0*     CBC: Recent Labs  Lab 02/02/20 1941 02/03/20 0434  WBC 6.1 6.3  NEUTROABS 5.0  --   HGB 8.9* 8.2*  HCT 27.6* 25.8*  MCV 90.8 91.2  PLT 138* 130*      Lab Results  Component Value Date    HEPBSAG NON REACTIVE 10/08/2019      Microbiology:  Recent Results (from the past 240 hour(s))  Resp Panel by RT-PCR (Flu A&B, Covid) Nasopharyngeal Swab     Status: None   Collection Time: 02/02/20  7:36 PM   Specimen: Nasopharyngeal Swab; Nasopharyngeal(NP) swabs in vial transport medium  Result Value Ref Range Status   SARS Coronavirus 2 by RT PCR NEGATIVE NEGATIVE Final    Comment: (NOTE) SARS-CoV-2 target nucleic acids are NOT DETECTED.  The SARS-CoV-2 RNA is generally detectable in upper respiratory specimens during the acute phase of infection. The lowest concentration of SARS-CoV-2 viral copies this assay can detect is 138 copies/mL. A negative result does not preclude SARS-Cov-2 infection and should not be used as the sole basis for treatment or other patient management decisions. A negative result may occur with  improper specimen collection/handling, submission of specimen other than nasopharyngeal swab, presence of viral mutation(s) within the areas targeted by this assay, and inadequate number of viral copies(<138 copies/mL). A negative result must be combined with clinical observations, patient history, and epidemiological information. The expected result is Negative.  Fact Sheet for Patients:  EntrepreneurPulse.com.au  Fact Sheet for  Healthcare Providers:  IncredibleEmployment.be  This test is no t yet approved or cleared by the [place] and  has been authorized for detection and/or diagnosis of SARS-CoV-2 by FDA under an Emergency Use Authorization (EUA). This EUA will remain  in effect (meaning this test can be used) for the duration of the COVID-19 declaration under Section 564(b)(1) of the Act, 21 U.S.C.section 360bbb-3(b)(1), unless the authorization is terminated  or revoked sooner.       Influenza A by PCR NEGATIVE NEGATIVE Final   Influenza B by PCR NEGATIVE NEGATIVE Final    Comment: (NOTE) The Xpert  Xpress SARS-CoV-2/FLU/RSV plus assay is intended as an aid in the diagnosis of influenza from Nasopharyngeal swab specimens and should not be used as a sole basis for treatment. Nasal washings and aspirates are unacceptable for Xpert Xpress SARS-CoV-2/FLU/RSV testing.  Fact Sheet for Patients: EntrepreneurPulse.com.au  Fact Sheet for Healthcare Providers: IncredibleEmployment.be  This test is not yet approved or cleared by the [place] FDA and has been authorized for detection and/or diagnosis of SARS-CoV-2 by FDA under an Emergency Use Authorization (EUA). This EUA will remain in effect (meaning this test can be used) for the duration of the COVID-19 declaration under Section 564(b)(1) of the Act, 21 U.S.C. section 360bbb-3(b)(1), unless the authorization is terminated or revoked.  Performed at [facility], [address] [place], [place] [address]   MRSA PCR Screening     Status: None   Collection Time: 02/03/20  1:45 PM   Specimen: Nasopharyngeal  Result Value Ref Range Status   MRSA by PCR NEGATIVE NEGATIVE Final    Comment:        The GeneXpert MRSA Assay (FDA approved for NASAL specimens only), is one component of a comprehensive MRSA colonization surveillance program. It is not intended to diagnose MRSA infection nor to guide or monitor treatment for MRSA infections. Performed at [facility], [address] [place], [place] [address]     Coagulation Studies: No results for input(s): LABPROT, INR in the last 72 hours.  Urinalysis: No results for input(s): COLORURINE, LABSPEC, PHURINE, GLUCOSEU, HGBUR, BILIRUBINUR, KETONESUR, PROTEINUR, UROBILINOGEN, NITRITE, LEUKOCYTESUR in the last 72 hours.  Invalid input(s): APPERANCEUR    Imaging: PERIPHERAL VASCULAR CATHETERIZATION  Result Date: 02/05/2020 See op note  PERIPHERAL VASCULAR CATHETERIZATION  Result Date: 02/04/2020 See op  note    Medications:    . amiodarone  100 mg Oral Daily  . atorvastatin  40 mg Oral QHS  . Chlorhexidine Gluconate Cloth  6 each Topical Q0600  . feeding supplement (NEPRO CARB STEADY)  237 mL Oral BID BM  . FLUoxetine  20 mg Oral QHS  . folic acid  1 mg Oral Daily  . gabapentin  100 mg Oral QHS  . [START ON 02/07/2020] heparin injection (subcutaneous)  5,000 Units Subcutaneous Q12H  . hydrALAZINE  10 mg Oral BID  . insulin aspart  0-15 Units Subcutaneous TID WC  . insulin aspart  0-5 Units Subcutaneous QHS  . levothyroxine  88 mcg Oral Q0600  . lidocaine-prilocaine   Topical Once  . pantoprazole  40 mg Oral Daily   acetaminophen **OR** acetaminophen, fluticasone, heparin, HYDROmorphone (DILAUDID) injection, melatonin, ondansetron **OR** ondansetron (ZOFRAN) IV, ondansetron (ZOFRAN) IV  Assessment/ Plan:  64 y.o. female with  was admitted on 02/02/2020 for  Principal Problem:   Acute metabolic encephalopathy Active Problems:   Hypothyroid   HTN (hypertension)   Type 2 diabetes mellitus with ESRD (end-stage renal disease) (  [place])   Coronary artery disease   Atrial fibrillation, chronic (HCC)   Depression   Chronic diastolic CHF (congestive heart failure) ([place])   ESRD (end stage renal disease) (HCC)   Anemia of chronic renal failure, stage 5 (HCC)   Hypoglycemia   Functional quadriplegia (HCC)  ESRD on dialysis ([place]) [N18.6, Z99.2] AMS (altered mental status) [F00.71] Acute metabolic encephalopathy [Q19.75]  #. ESRD Patient got her dialysis done yesterday after the placement of tunneled catheter on her right femoral by vascular team We are planning for dialysis again today   # Complication of Dialysis access Patient underwent fistulogram and stent placement of left AV graft on 02/04/2020, Ongoing issues with cannulation reported by dialysis staff Right femoral tunneled catheter placed by vascular team on 02/05/2020 Dialysis treatments restarted yesterday  #. Anemia  of CKD  Lab Results  Component Value Date   HGB 8.2 (L) 02/03/2020   Epogen with dialysis treatments  #. Secondary hyperparathyroidism of renal origin N 25.81      Component Value Date/Time   PTH 83 (H) 10/09/2019 0847   Lab Results  Component Value Date   PHOS 10.0 (H) 02/05/2020   Hyperphosphatemia noted Dialysis again today   #. Diabetes type 2 with CKD Hemoglobin A1C (no units)  Date Value  08/16/2017 7.1   Hgb A1c MFr Bld (%)  Date Value  02/03/2020 4.6 (L)  Blood glucose readings within low normal range Patient's oral intake stays poor   LOS: 3 Jamie Arias 12/4/20211:31 PM  [facility] [place], [place]

## 2020-02-06 NOTE — Care Plan (Addendum)
Pt returned from dialysis at this time, pt is more alert than this am, VSS. Pt cleansed at this time, medium BM noted, brown jelly like. Peri care provided and pads changed with [place], NT. Husband at bedside. IV team order placed for access to be able to administer epogen. Pt has noted stage II, dime size ulcer to left mid sacrum. Area cleansed with secura cleaner, ointment applied and foam dressing placed. Pt turned to left side. MD Mal Misty made aware.

## 2020-02-06 NOTE — Progress Notes (Incomplete)
Report given to RN for room 201. In report Informed her about bleeding noted at the IV site to the right lower arm.

## 2020-02-07 LAB — BASIC METABOLIC PANEL
Anion gap: 10 (ref 5–15)
BUN: 18 mg/dL (ref 8–23)
CO2: 30 mmol/L (ref 22–32)
Calcium: 8.6 mg/dL — ABNORMAL LOW (ref 8.9–10.3)
Chloride: 96 mmol/L — ABNORMAL LOW (ref 98–111)
Creatinine, Ser: 5.31 mg/dL — ABNORMAL HIGH (ref 0.44–1.00)
GFR, Estimated: 8 mL/min — ABNORMAL LOW (ref >60)
Glucose, Bld: 79 mg/dL (ref 70–99)
Potassium: 4 mmol/L (ref 3.5–5.1)
Sodium: 136 mmol/L (ref 135–145)

## 2020-02-07 LAB — GLUCOSE, CAPILLARY
Glucose-Capillary: 69 mg/dL — ABNORMAL LOW (ref 70–99)
Glucose-Capillary: 109 mg/dL — ABNORMAL HIGH (ref 70–99)
Glucose-Capillary: 141 mg/dL — ABNORMAL HIGH (ref 70–99)
Glucose-Capillary: 103 mg/dL — ABNORMAL HIGH (ref 70–99)
Glucose-Capillary: 91 mg/dL (ref 70–99)

## 2020-02-07 NOTE — Progress Notes (Signed)
Mobility Specialist - Progress Note   02/07/20 1240  Mobility  Activity Turned to left side;Turned to back (supine)  Level of Assistance Moderate assist, patient does 50-74%  Assistive Device None  Mobility Response Tolerated well  Mobility performed by Mobility specialist  $Mobility charge 1 Mobility    Pt was lying in bed upon arrival with family present in room. Pt had a loose BM on arrival. Nurse and tech notified to assist with self-care. Pt able to roll to L side for peri-care d/t HD cath placement on R femoral. Pt needing modA to perform log-rolling, but able to hold self in position for a few moments with minA. Overall, pt tolerated session well. Pt was left in bed with all needs in reach. Nurse and tech still present after exit.    Kathee Delton Mobility Specialist 02/07/20, 12:46 PM

## 2020-02-07 NOTE — Care Plan (Addendum)
FSBS 69. Pt awake and drank 120cc juice with nepro drink at this time. Will recheck FSBS in 15 minutes.   Recheck: 109.

## 2020-02-07 NOTE — Progress Notes (Signed)
[facility], [place] 02/07/20  Subjective:   LOS: 4 Patient received dialysis treatment yesterday with UF of 500 ml. She tolerated the procedure well. Appears very lethargic again this morning, was hypoglycemic earlier this morning, got treated with Juice.  Objective:  Vital signs in last 24 hours:  Temp:  [97.4 F (36.3 C)-98.7 F (37.1 C)] 98.1 F (36.7 C) (12/05 1207) Pulse Rate:  [57-66] 60 (12/05 1207) Resp:  [14-18] 16 (12/05 1207) BP: (126-145)/(39-49) 126/43 (12/05 1207) SpO2:  [93 %-100 %] 93 % (12/05 1207)  Weight change:  Filed Weights   02/04/20 1528 02/05/20 1412 02/06/20 0500  Weight: 74.8 kg 74.8 kg 68.3 kg    Intake/Output:    Intake/Output Summary (Last 24 hours) at 02/07/2020 1332 Last data filed at 02/07/2020 0827 Gross per 24 hour  Intake 220 ml  Output 500 ml  Net -280 ml    Physical Exam: General:  Sleeping in bed, appears lethargic  HEENT  Oral mucous membrane dry  Pulm/lungs  On 3L supplemental O2, respirations unlabored, lungs clear but diminished at the bases  CVS/Heart  S1S2, no rubs or gallops  Abdomen:   Soft, non tender,non distended  Extremities:  Trace  peripheral edema  Neurologic: Sleepy and lethargic   Skin:  No acute lesions or rashes  Left arm AVG Right femoral tunneled catheter by Dr. Lucky Cowboy on 02/05/2020      Basic Metabolic Panel:  Recent Labs  Lab 02/02/20 1941 02/02/20 1941 02/03/20 0434 02/04/20 2241 02/05/20 0629 02/07/20 0818  NA 139  --  136  --  139 136  K 3.7  --  3.5 3.7 3.8 4.0  CL 98  --  96*  --  98 96*  CO2 22  --  24  --  22 30  GLUCOSE 67*  --  117*  --  78 79  BUN 68*  --  70*  --  81* 18  CREATININE 16.46*  --  16.65*  --  17.97* 5.31*  CALCIUM 8.5*   < > 8.4*  --  8.3* 8.6*  PHOS  --   --   --   --  10.0*  --    < > = values in this interval not displayed.     CBC: Recent Labs  Lab 02/02/20 1941 02/03/20 0434  WBC 6.1 6.3  NEUTROABS 5.0  --   HGB 8.9* 8.2*   HCT 27.6* 25.8*  MCV 90.8 91.2  PLT 138* 130*      Lab Results  Component Value Date   HEPBSAG NON REACTIVE 10/08/2019      Microbiology:  Recent Results (from the past 240 hour(s))  Resp Panel by RT-PCR (Flu A&B, Covid) Nasopharyngeal Swab     Status: None   Collection Time: 02/02/20  7:36 PM   Specimen: Nasopharyngeal Swab; Nasopharyngeal(NP) swabs in vial transport medium  Result Value Ref Range Status   SARS Coronavirus 2 by RT PCR NEGATIVE NEGATIVE Final    Comment: (NOTE) SARS-CoV-2 target nucleic acids are NOT DETECTED.  The SARS-CoV-2 RNA is generally detectable in upper respiratory specimens during the acute phase of infection. The lowest concentration of SARS-CoV-2 viral copies this assay can detect is 138 copies/mL. A negative result does not preclude SARS-Cov-2 infection and should not be used as the sole basis for treatment or other patient management decisions. A negative result may occur with  improper specimen collection/handling, submission of specimen other than nasopharyngeal swab, presence of viral mutation(s) within  the areas targeted by this assay, and inadequate number of viral copies(<138 copies/mL). A negative result must be combined with clinical observations, patient history, and epidemiological information. The expected result is Negative.  Fact Sheet for Patients:  EntrepreneurPulse.com.au  Fact Sheet for Healthcare Providers:  IncredibleEmployment.be  This test is no t yet approved or cleared by the [place] FDA and  has been authorized for detection and/or diagnosis of SARS-CoV-2 by FDA under an Emergency Use Authorization (EUA). This EUA will remain  in effect (meaning this test can be used) for the duration of the COVID-19 declaration under Section 564(b)(1) of the Act, 21 U.S.C.section 360bbb-3(b)(1), unless the authorization is terminated  or revoked sooner.       Influenza A by PCR  NEGATIVE NEGATIVE Final   Influenza B by PCR NEGATIVE NEGATIVE Final    Comment: (NOTE) The Xpert Xpress SARS-CoV-2/FLU/RSV plus assay is intended as an aid in the diagnosis of influenza from Nasopharyngeal swab specimens and should not be used as a sole basis for treatment. Nasal washings and aspirates are unacceptable for Xpert Xpress SARS-CoV-2/FLU/RSV testing.  Fact Sheet for Patients: EntrepreneurPulse.com.au  Fact Sheet for Healthcare Providers: IncredibleEmployment.be  This test is not yet approved or cleared by the [place] FDA and has been authorized for detection and/or diagnosis of SARS-CoV-2 by FDA under an Emergency Use Authorization (EUA). This EUA will remain in effect (meaning this test can be used) for the duration of the COVID-19 declaration under Section 564(b)(1) of the Act, 21 U.S.C. section 360bbb-3(b)(1), unless the authorization is terminated or revoked.  Performed at [facility], [address] [place], [place] [address]   MRSA PCR Screening     Status: None   Collection Time: 02/03/20  1:45 PM   Specimen: Nasopharyngeal  Result Value Ref Range Status   MRSA by PCR NEGATIVE NEGATIVE Final    Comment:        The GeneXpert MRSA Assay (FDA approved for NASAL specimens only), is one component of a comprehensive MRSA colonization surveillance program. It is not intended to diagnose MRSA infection nor to guide or monitor treatment for MRSA infections. Performed at [facility], [address] [place], [place] [address]     Coagulation Studies: No results for input(s): LABPROT, INR in the last 72 hours.  Urinalysis: No results for input(s): COLORURINE, LABSPEC, PHURINE, GLUCOSEU, HGBUR, BILIRUBINUR, KETONESUR, PROTEINUR, UROBILINOGEN, NITRITE, LEUKOCYTESUR in the last 72 hours.  Invalid input(s): APPERANCEUR    Imaging: PERIPHERAL VASCULAR CATHETERIZATION  Result Date:  02/05/2020 See op note    Medications:    . amiodarone  100 mg Oral Daily  . atorvastatin  40 mg Oral QHS  . Chlorhexidine Gluconate Cloth  6 each Topical Q0600  . epoetin (EPOGEN/PROCRIT) injection  10,000 Units Intravenous Q T,Th,Sa-HD  . feeding supplement (NEPRO CARB STEADY)  237 mL Oral BID BM  . FLUoxetine  20 mg Oral QHS  . folic acid  1 mg Oral Daily  . gabapentin  100 mg Oral QHS  . heparin injection (subcutaneous)  5,000 Units Subcutaneous Q12H  . hydrALAZINE  10 mg Oral BID  . insulin aspart  0-15 Units Subcutaneous TID WC  . insulin aspart  0-5 Units Subcutaneous QHS  . levothyroxine  88 mcg Oral Q0600  . lidocaine-prilocaine   Topical Once  . pantoprazole  40 mg Oral Daily   acetaminophen **OR** acetaminophen, fluticasone, heparin, HYDROmorphone (DILAUDID) injection, melatonin, ondansetron **OR** ondansetron (ZOFRAN) IV, ondansetron (ZOFRAN) IV  Assessment/  Plan:  64 y.o. female with  was admitted on 02/02/2020 for  Principal Problem:   Acute metabolic encephalopathy Active Problems:   Hypothyroid   HTN (hypertension)   Type 2 diabetes mellitus with ESRD (end-stage renal disease) (HCC)   Coronary artery disease   Atrial fibrillation, chronic (HCC)   Depression   Chronic diastolic CHF (congestive heart failure) (HCC)   ESRD (end stage renal disease) (HCC)   Anemia of chronic renal failure, stage 5 (HCC)   Hypoglycemia   Functional quadriplegia (HCC)  ESRD on dialysis ([place]) [N18.6, Z99.2] AMS (altered mental status) [H60.73] Acute metabolic encephalopathy [X10.62]  #. ESRD on HD MWF [place], MWF,Rt.Femoral cath Patient received dialysis treatments on Friday and again yesterday. Volume and electrolyte status acceptable No acute indication for additional dialysis today We will reassess her tomorrow for dialysis requirement   # Complication of Dialysis access Patient underwent fistulogram and stent placement of left AV graft on  02/04/2020, Ongoing issues with cannulation reported by dialysis staff Right femoral tunneled catheter placed by vascular team on 02/05/2020 Dialysis treatments restarted on 02/05/2020  #. Anemia of CKD  Lab Results  Component Value Date   HGB 8.2 (L) 02/03/2020   Epogen 10,000 units with HD  #. Secondary hyperparathyroidism of renal origin N 25.81      Component Value Date/Time   PTH 83 (H) 10/09/2019 0847   Lab Results  Component Value Date   PHOS 10.0 (H) 02/05/2020   We will continue monitoring bone mineral metabolism parameters  #. Diabetes type 2 with CKD Hemoglobin A1C (no units)  Date Value  08/16/2017 7.1   Hgb A1c MFr Bld (%)  Date Value  02/03/2020 4.6 (L)  Hypoglycemic episode this morning with  FSBS 69 Orange juice and Nepro was given, blood sugar improved to 109 on recheck Encourage PO intake maintaining aspiration precautions   LOS: 4 Demitri Kucinski 12/5/20211:32 PM  [place], [place]

## 2020-02-07 NOTE — Progress Notes (Addendum)
Progress Note    Jamie Arias  HMC:[id number] DOB: 02-07-1956  DOA: 02/02/2020 PCP: [name], MD      Brief Narrative:    Medical records reviewed and are as summarized below:  Jamie Arias is a 64 y.o. female with medical history significant for ESRD on HD MWF with anemia of CKD, HTN, CAD, A. fib, hypothyroidism, depression, chronic diastolic heart failure who was referred to the ED from the dialysis center after having missed 5 dialysis sessions.  Patient is wheelchair and bedbound and total care dependent for ADLs though can feed herself.  Her husband is her caregiver.  She said she missed 5 dialysis sessions because she just did not feel like going to an outpatient dialysis center.  She has been eating less and sleeping more.  She was admitted to the hospital for acute metabolic/uremic encephalopathy likely due to missed hemodialysis sessions.  Hemodialysis was delayed because of malfunctioning of left arm AV graft.  Left arm shuntogram with stent placement to AV graft was performed on 02/04/2020 but AV graft could still not be accessed for hemodialysis.  Eventually, right femoral permacath was placed and hemodialysis was done on 02/06/2020.  She had a hypoglycemic episodes requiring IV dextrose as needed.  Assessment/Plan:   Principal Problem:   Acute metabolic encephalopathy Active Problems:   Hypothyroid   HTN (hypertension)   Type 2 diabetes mellitus with ESRD (end-stage renal disease) (HCC)   Coronary artery disease   Atrial fibrillation, chronic (HCC)   Depression   Chronic diastolic CHF (congestive heart failure) (HCC)   ESRD (end stage renal disease) (HCC)   Anemia of chronic renal failure, stage 5 (HCC)   Hypoglycemia   Functional quadriplegia (HCC)    Body mass index is 25.85 kg/m.    Acute toxic metabolic/uremic encephalopathy due to missed hemodialysis, ESRD, malfunctioning  with recent bleeding bleeding left brachial AV graft: Mental  status is improving but she is still not quite back to baseline.  Left arm AV graft access is still dysfunctional and it cannot be used for hemodialysis.  S/p left arm shuntogram with stent placement left AV graft on 02/04/2020.  S/p right femoral permacath on 02/05/2020.  She successfully underwent hemodialysis on 02/06/2020.  Follow-up with nephrologist for further recommendations.  Excessive bleeding from right arm peripheral IV site on 02/06/2020: Resolved after application of pressure to the bleeding site.  Hypertension: Continue hydralazine  CAD, chronic diastolic CHF, paroxysmal atrial fibrillation: No acute issues. Continue Lipitor and amiodarone.  She has been off of Eliquis because of history of GI bleed and severe anemia.  Outpatient follow-up with PCP for further recommendations.  Anemia of chronic disease: H&H is stable.  Continue Epogen.  Type II DM with recurrent hypoglycemia, poor oral intake: Hemoglobin A1c is 4.6. Monitor glucose levels closely.  IV dextrose as needed for hypoglycemia.  Encourage adequate oral intake.  Patient is bedbound and wheelchair-bound.  Husband said she has not been able to walk since he had COVID-19 infection in June 2020.  Stage II sacral decubitus ulcer (present on admission): Continue local wound care. Turn patient every 2 hours.    Pressure Injury 02/06/20 Sacrum Left Stage 2 -  Partial thickness loss of dermis presenting as a shallow open injury with a red, pink wound bed without slough. (Active)  02/06/20 1427  Location: Sacrum  Location Orientation: Left  Staging: Stage 2 -  Partial thickness loss of dermis presenting as a shallow open injury with  a red, pink wound bed without slough.  Wound Description (Comments):   Present on Admission:          Diet Order            Diet renal with fluid restriction Fluid restriction: 1200 mL Fluid; Room service appropriate? Yes; Fluid consistency: Thin  Diet effective now                     Consultants:  Nephrologist  Vascular surgeon  Procedures:  Left arm shuntogram with stent placement to AV graft on 02/04/2020  Placement of tunneled hemodialysis catheter via right femoral vein on 02/05/2020    Medications:   . amiodarone  100 mg Oral Daily  . atorvastatin  40 mg Oral QHS  . Chlorhexidine Gluconate Cloth  6 each Topical Q0600  . epoetin (EPOGEN/PROCRIT) injection  10,000 Units Intravenous Q T,Th,Sa-HD  . feeding supplement (NEPRO CARB STEADY)  237 mL Oral BID BM  . FLUoxetine  20 mg Oral QHS  . folic acid  1 mg Oral Daily  . gabapentin  100 mg Oral QHS  . heparin injection (subcutaneous)  5,000 Units Subcutaneous Q12H  . hydrALAZINE  10 mg Oral BID  . insulin aspart  0-15 Units Subcutaneous TID WC  . insulin aspart  0-5 Units Subcutaneous QHS  . levothyroxine  88 mcg Oral Q0600  . lidocaine-prilocaine   Topical Once  . pantoprazole  40 mg Oral Daily   Continuous Infusions:    Anti-infectives (From admission, onward)   Start     Dose/Rate Route Frequency Ordered Stop   02/06/20 0000  ceFAZolin (ANCEF) IVPB 1 g/50 mL premix       Note to Pharmacy: To be given in specials   1 g 100 mL/hr over 30 Minutes Intravenous  Once 02/05/20 1413 02/05/20 1624   02/05/20 1417  ceFAZolin (ANCEF) 1-4 GM/50ML-% IVPB       Note to Pharmacy: Maynor, Erin   : cabinet override      02/05/20 1417 02/05/20 1624   02/04/20 1515  ceFAZolin (ANCEF) IVPB 1 g/50 mL premix  Status:  Discontinued        1 g 100 mL/hr over 30 Minutes Intravenous  Once 02/04/20 1507 02/04/20 1703             Family Communication/Anticipated D/C date and plan/Code Status   DVT prophylaxis:      Code Status: Full Code  Family Communication: None Disposition Plan:    Status is: Inpatient  Remains inpatient appropriate because:Inpatient level of care appropriate due to severity of illness   Dispo: The patient is from: Home              Anticipated d/c is to: Home               Anticipated d/c date is: 1 day              Patient currently is not medically stable to d/c.                Subjective:   Interval events noted.  Patient is more alert today compared to yesterday.  Objective:    Vitals:   02/06/20 2356 02/07/20 0441 02/07/20 0820 02/07/20 1207  BP: (!) 142/39 (!) 145/48 (!) 145/49 (!) 126/43  Pulse: (!) 58 60 60 60  Resp: 18 14 18 16   Temp: 98.7 F (37.1 C) 98 F (36.7 C) 97.7 F (36.5 C) 98.1 F (36.7 C)  TempSrc: Oral Oral Oral   SpO2: 100% 98% 100% 93%  Weight:      Height:       No data found.   Intake/Output Summary (Last 24 hours) at 02/07/2020 1221 Last data filed at 02/07/2020 0827 Gross per 24 hour  Intake 220 ml  Output 500 ml  Net -280 ml   Filed Weights   02/04/20 1528 02/05/20 1412 02/06/20 0500  Weight: 74.8 kg 74.8 kg 68.3 kg    Exam:  GEN: NAD SKIN: Warm and dry EYES: No pallor or icterus ENT: MMM CV: RRR PULM: CTA B ABD: soft, ND, NT, +BS CNS: Alert, oriented to person and place.  No focal deficits.  Tremors of bilateral upper extremities. EXT: No edema or tenderness      Data Reviewed:   I have personally reviewed following labs and imaging studies:  Labs: Labs show the following:   Basic Metabolic Panel: Recent Labs  Lab 02/02/20 1941 02/02/20 1941 02/03/20 0434 02/04/20 2241 02/05/20 0629 02/07/20 0818  NA 139  --  136  --  139 136  K 3.7   < > 3.5   < > 3.8 4.0  CL 98  --  96*  --  98 96*  CO2 22  --  24  --  22 30  GLUCOSE 67*  --  117*  --  78 79  BUN 68*  --  70*  --  81* 18  CREATININE 16.46*  --  16.65*  --  17.97* 5.31*  CALCIUM 8.5*  --  8.4*  --  8.3* 8.6*  PHOS  --   --   --   --  10.0*  --    < > = values in this interval not displayed.   GFR Estimated Creatinine Clearance: 9.9 mL/min (A) (by C-G formula based on SCr of 5.31 mg/dL (H)). Liver Function Tests: Recent Labs  Lab 02/05/20 0629  ALBUMIN 2.3*   No results for input(s): LIPASE,  AMYLASE in the last 168 hours. No results for input(s): AMMONIA in the last 168 hours. Coagulation profile No results for input(s): INR, PROTIME in the last 168 hours.  CBC: Recent Labs  Lab 02/02/20 1941 02/03/20 0434  WBC 6.1 6.3  NEUTROABS 5.0  --   HGB 8.9* 8.2*  HCT 27.6* 25.8*  MCV 90.8 91.2  PLT 138* 130*   Cardiac Enzymes: No results for input(s): CKTOTAL, CKMB, CKMBINDEX, TROPONINI in the last 168 hours. BNP (last 3 results) No results for input(s): PROBNP in the last 8760 hours. CBG: Recent Labs  Lab 02/06/20 1639 02/06/20 2009 02/07/20 0742 02/07/20 0850 02/07/20 1204  GLUCAP 85 93 69* 109* 141*   D-Dimer: No results for input(s): DDIMER in the last 72 hours. Hgb A1c: No results for input(s): HGBA1C in the last 72 hours. Lipid Profile: No results for input(s): CHOL, HDL, LDLCALC, TRIG, CHOLHDL, LDLDIRECT in the last 72 hours. Thyroid function studies: No results for input(s): TSH, T4TOTAL, T3FREE, THYROIDAB in the last 72 hours.  Invalid input(s): FREET3 Anemia work up: No results for input(s): VITAMINB12, FOLATE, FERRITIN, TIBC, IRON, RETICCTPCT in the last 72 hours. Sepsis Labs: Recent Labs  Lab 02/02/20 1941 02/03/20 0434  WBC 6.1 6.3    Microbiology Recent Results (from the past 240 hour(s))  Resp Panel by RT-PCR (Flu A&B, Covid) Nasopharyngeal Swab     Status: None   Collection Time: 02/02/20  7:36 PM   Specimen: Nasopharyngeal Swab; Nasopharyngeal(NP) swabs in vial transport medium  Result Value Ref Range Status   SARS Coronavirus 2 by RT PCR NEGATIVE NEGATIVE Final    Comment: (NOTE) SARS-CoV-2 target nucleic acids are NOT DETECTED.  The SARS-CoV-2 RNA is generally detectable in upper respiratory specimens during the acute phase of infection. The lowest concentration of SARS-CoV-2 viral copies this assay can detect is 138 copies/mL. A negative result does not preclude SARS-Cov-2 infection and should not be used as the sole basis for  treatment or other patient management decisions. A negative result may occur with  improper specimen collection/handling, submission of specimen other than nasopharyngeal swab, presence of viral mutation(s) within the areas targeted by this assay, and inadequate number of viral copies(<138 copies/mL). A negative result must be combined with clinical observations, patient history, and epidemiological information. The expected result is Negative.  Fact Sheet for Patients:  EntrepreneurPulse.com.au  Fact Sheet for Healthcare Providers:  IncredibleEmployment.be  This test is no t yet approved or cleared by the [place] FDA and  has been authorized for detection and/or diagnosis of SARS-CoV-2 by FDA under an Emergency Use Authorization (EUA). This EUA will remain  in effect (meaning this test can be used) for the duration of the COVID-19 declaration under Section 564(b)(1) of the Act, 21 U.S.C.section 360bbb-3(b)(1), unless the authorization is terminated  or revoked sooner.       Influenza A by PCR NEGATIVE NEGATIVE Final   Influenza B by PCR NEGATIVE NEGATIVE Final    Comment: (NOTE) The Xpert Xpress SARS-CoV-2/FLU/RSV plus assay is intended as an aid in the diagnosis of influenza from Nasopharyngeal swab specimens and should not be used as a sole basis for treatment. Nasal washings and aspirates are unacceptable for Xpert Xpress SARS-CoV-2/FLU/RSV testing.  Fact Sheet for Patients: EntrepreneurPulse.com.au  Fact Sheet for Healthcare Providers: IncredibleEmployment.be  This test is not yet approved or cleared by the [place] FDA and has been authorized for detection and/or diagnosis of SARS-CoV-2 by FDA under an Emergency Use Authorization (EUA). This EUA will remain in effect (meaning this test can be used) for the duration of the COVID-19 declaration under Section 564(b)(1) of the Act, 21  U.S.C. section 360bbb-3(b)(1), unless the authorization is terminated or revoked.  Performed at [facility], [address] [place], [place] [address]   MRSA PCR Screening     Status: None   Collection Time: 02/03/20  1:45 PM   Specimen: Nasopharyngeal  Result Value Ref Range Status   MRSA by PCR NEGATIVE NEGATIVE Final    Comment:        The GeneXpert MRSA Assay (FDA approved for NASAL specimens only), is one component of a comprehensive MRSA colonization surveillance program. It is not intended to diagnose MRSA infection nor to guide or monitor treatment for MRSA infections. Performed at [facility], [address] [place], [place] [address]     Procedures and diagnostic studies:  PERIPHERAL VASCULAR CATHETERIZATION  Result Date: 02/05/2020 See op note              LOS: 4 days   Bow Buntyn  [facility]   Pager on [email or web address]. If 7PM-7AM, please contact night-coverage at [email or web address]     02/07/2020, 12:21 PM

## 2020-02-08 ENCOUNTER — Encounter: Payer: Self-pay | Admitting: Vascular Surgery

## 2020-02-08 LAB — BASIC METABOLIC PANEL
Anion gap: 10 (ref 5–15)
BUN: 24 mg/dL — ABNORMAL HIGH (ref 8–23)
CO2: 29 mmol/L (ref 22–32)
Calcium: 8.3 mg/dL — ABNORMAL LOW (ref 8.9–10.3)
Chloride: 97 mmol/L — ABNORMAL LOW (ref 98–111)
Creatinine, Ser: 6.32 mg/dL — ABNORMAL HIGH (ref 0.44–1.00)
GFR, Estimated: 7 mL/min — ABNORMAL LOW (ref >60)
Glucose, Bld: 85 mg/dL (ref 70–99)
Potassium: 3.5 mmol/L (ref 3.5–5.1)
Sodium: 136 mmol/L (ref 135–145)

## 2020-02-08 LAB — GLUCOSE, CAPILLARY
Glucose-Capillary: 79 mg/dL (ref 70–99)
Glucose-Capillary: 71 mg/dL (ref 70–99)
Glucose-Capillary: 71 mg/dL (ref 70–99)
Glucose-Capillary: 128 mg/dL — ABNORMAL HIGH (ref 70–99)
Glucose-Capillary: 70 mg/dL (ref 70–99)
Glucose-Capillary: 115 mg/dL — ABNORMAL HIGH (ref 70–99)

## 2020-02-08 LAB — PHOSPHORUS: Phosphorus: 3.6 mg/dL (ref 2.5–4.6)

## 2020-02-08 MED ORDER — DEXTROSE 50 % IV SOLN
INTRAVENOUS | Status: AC
  Administered 2020-02-08: 25 mL
  Filled 2020-02-08: qty 50

## 2020-02-08 NOTE — Progress Notes (Signed)
Progress Note    Jamie Arias  LXB:[id number] DOB: Jan 29, 1956  DOA: 02/02/2020 PCP: [name], MD    Brief Narrative:    Medical records reviewed and are as summarized below:  Jamie Arias is a 64 y.o. female with medical history significant for ESRD on HD MWF with anemia of CKD, HTN, CAD, A. fib, hypothyroidism, depression, chronic diastolic heart failure who was referred to the ED from the dialysis center after having missed 5 dialysis sessions.  Patient is wheelchair and bedbound and total care dependent for ADLs though can feed herself.  Her husband is her caregiver.  She said she missed 5 dialysis sessions because she just did not feel like going to an outpatient dialysis center.  She has been eating less and sleeping more.  She was admitted to the hospital for acute metabolic/uremic encephalopathy due to missed hemodialysis sessions.  Hemodialysis was delayed because of malfunctioning of left arm AV graft.  On 12/2, Left arm shuntogram with stent placement to AV graft was performed but AV graft could still not be accessed for hemodialysis.  On 12/4, right femoral permacath was placed and hemodialysis was started.  She has had a few hypoglycemic episodes requiring IV dextrose as needed.  Assessment/Plan:   Principal Problem:   Acute metabolic encephalopathy Active Problems:   Hypothyroid   HTN (hypertension)   Type 2 diabetes mellitus with ESRD (end-stage renal disease) (HCC)   Coronary artery disease   Atrial fibrillation, chronic (HCC)   Depression   Chronic diastolic CHF (congestive heart failure) (HCC)   ESRD (end stage renal disease) (HCC)   Anemia of chronic renal failure, stage 5 (HCC)   Hypoglycemia   Functional quadriplegia (HCC)  Body mass index is 25.85 kg/m.    Acute toxic metabolic/uremic encephalopathy due to missed hemodialysis:  - Mental status is improving with dialysis but mental status is not at baseline.  ESRD with  bleeding left  brachial AV graft - Continue dialysis per Nephrology.  Left arm AV graft access is still dysfunctional and it cannot be used for hemodialysis.   12/2 - left arm shuntogram with stent placement left AV graft .   12/3 - right femoral permacath placed.   12/4 - hemodialysis.   - Nephrology following, appreciate.    Excessive bleeding from right arm peripheral IV site on 02/06/2020: Resolved after application of pressure to the bleeding site.  Hypertension:  - Continue hydralazine  Coronary Artery Disease - Not on antiplatelets due to history of severe GI bleed. - Continue Statin.  Chronic Diastolic Heart Failure - Lasix PRN.  Paroxysmal atrial fibrillation: No acute issues.  - Continue Amiodarone for rhythm control. - Not on anticoagulation due to history of severe GI bleed.  This decision was made by patient and her PCP. - Monitor on telemetry.  Anemia of chronic disease: H&H is stable.   - Continue Epogen with dialysis.  Type II DM with recurrent hypoglycemia, poor oral intake: Hemoglobin A1c is 4.6%.  - Monitor glucose levels closely.  IV dextrose as needed for hypoglycemia.  Encourage adequate oral intake.  Patient is bedbound and wheelchair-bound.   - Husband said she has not been able to walk since he had COVID-19 infection in June 2020.  Stage II sacral decubitus ulcer (present on admission):  - Continue local wound care. Turn patient every 2 hours.    Pressure Injury 02/06/20 Sacrum Left Stage 2 -  Partial thickness loss of dermis presenting as a  shallow open injury with a red, pink wound bed without slough. (Active)  02/06/20 1427  Location: Sacrum  Location Orientation: Left  Staging: Stage 2 -  Partial thickness loss of dermis presenting as a shallow open injury with a red, pink wound bed without slough.  Wound Description (Comments):   Present on Admission:       Diet Order            Diet renal with fluid restriction Fluid restriction: 1200 mL Fluid; Room  service appropriate? Yes; Fluid consistency: Thin  Diet effective now                 Consultants:  Nephrologist  Vascular surgeon  Procedures:  Left arm shuntogram with stent placement to AV graft on 02/04/2020  Placement of tunneled hemodialysis catheter via right femoral vein on 02/05/2020    Medications:   . amiodarone  100 mg Oral Daily  . atorvastatin  40 mg Oral QHS  . Chlorhexidine Gluconate Cloth  6 each Topical Q0600  . epoetin (EPOGEN/PROCRIT) injection  10,000 Units Intravenous Q T,Th,Sa-HD  . feeding supplement (NEPRO CARB STEADY)  237 mL Oral BID BM  . FLUoxetine  20 mg Oral QHS  . folic acid  1 mg Oral Daily  . gabapentin  100 mg Oral QHS  . heparin injection (subcutaneous)  5,000 Units Subcutaneous Q12H  . hydrALAZINE  10 mg Oral BID  . insulin aspart  0-15 Units Subcutaneous TID WC  . insulin aspart  0-5 Units Subcutaneous QHS  . levothyroxine  88 mcg Oral Q0600  . lidocaine-prilocaine   Topical Once  . pantoprazole  40 mg Oral Daily   Continuous Infusions:    Anti-infectives (From admission, onward)   Start     Dose/Rate Route Frequency Ordered Stop   02/06/20 0000  ceFAZolin (ANCEF) IVPB 1 g/50 mL premix       Note to Pharmacy: To be given in specials   1 g 100 mL/hr over 30 Minutes Intravenous  Once 02/05/20 1413 02/05/20 1624   02/05/20 1417  ceFAZolin (ANCEF) 1-4 GM/50ML-% IVPB       Note to Pharmacy: Maynor, Erin   : cabinet override      02/05/20 1417 02/05/20 1624   02/04/20 1515  ceFAZolin (ANCEF) IVPB 1 g/50 mL premix  Status:  Discontinued        1 g 100 mL/hr over 30 Minutes Intravenous  Once 02/04/20 1507 02/04/20 1703        Family Communication/Anticipated D/C date and plan/Code Status   DVT prophylaxis:      Code Status: Full Code  Family Communication: None Disposition Plan:   Status is: Inpatient  Remains inpatient appropriate because:Inpatient level of care appropriate due to severity of illness   Dispo: The  patient is from: Home              Anticipated d/c is to: Home              Anticipated d/c date is: 1 day              Patient currently is not medically stable to d/c.   Subjective:   Patient remains a little drowsy and lethargic.  Per husband, she is looking better since she arrived to hospital. She denies any headaches, chest pain, or shortness of breath.  Objective:    Vitals:   02/08/20 1300 02/08/20 1315 02/08/20 1330 02/08/20 1345  BP: (!) 131/31 (!) 133/30 (!) 140/41 Marland Kitchen)  132/46  Pulse: 66 63 64 63  Resp: 20 20 20 19   Temp:      TempSrc:      SpO2:      Weight:      Height:       No data found.   Intake/Output Summary (Last 24 hours) at 02/08/2020 1820 Last data filed at 02/08/2020 1500 Gross per 24 hour  Intake 0 ml  Output 500 ml  Net -500 ml   Filed Weights   02/04/20 1528 02/05/20 1412 02/06/20 0500  Weight: 74.8 kg 74.8 kg 68.3 kg    Exam:  GEN: NAD SKIN: Warm and dry EYES: No pallor or icterus ENT: MMM CV: RRR, did not appreciate a murmur. PULM: CTA B ABD: soft, ND, NT, +BS CNS: Alert, oriented to person and place.  No focal deficits.  Tremors of bilateral upper extremities. EXT: No edema or tenderness    Data Reviewed:   I have personally reviewed following labs and imaging studies:  Labs: Labs show the following:   Basic Metabolic Panel: Recent Labs  Lab 02/02/20 1941 02/02/20 1941 02/03/20 0434 02/04/20 2241 02/05/20 0629 02/05/20 0629 02/07/20 0818 02/08/20 0926  NA 139  --  136  --  139  --  136 136  K 3.7   < > 3.5   < > 3.8   < > 4.0 3.5  CL 98  --  96*  --  98  --  96* 97*  CO2 22  --  24  --  22  --  30 29  GLUCOSE 67*  --  117*  --  78  --  79 85  BUN 68*  --  70*  --  81*  --  18 24*  CREATININE 16.46*  --  16.65*  --  17.97*  --  5.31* 6.32*  CALCIUM 8.5*  --  8.4*  --  8.3*  --  8.6* 8.3*  PHOS  --   --   --   --  10.0*  --   --  3.6   < > = values in this interval not displayed.   GFR Estimated Creatinine  Clearance: 8.3 mL/min (A) (by C-G formula based on SCr of 6.32 mg/dL (H)). Liver Function Tests: Recent Labs  Lab 02/05/20 0629  ALBUMIN 2.3*    CBC: Recent Labs  Lab 02/02/20 1941 02/03/20 0434  WBC 6.1 6.3  NEUTROABS 5.0  --   HGB 8.9* 8.2*  HCT 27.6* 25.8*  MCV 90.8 91.2  PLT 138* 130*   CBG: Recent Labs  Lab 02/07/20 1543 02/07/20 2136 02/08/20 0828 02/08/20 1452 02/08/20 1654  GLUCAP 103* 91 79 71 71   Sepsis Labs: Recent Labs  Lab 02/02/20 1941 02/03/20 0434  WBC 6.1 6.3    Microbiology Recent Results (from the past 240 hour(s))  Resp Panel by RT-PCR (Flu A&B, Covid) Nasopharyngeal Swab     Status: None   Collection Time: 02/02/20  7:36 PM   Specimen: Nasopharyngeal Swab; Nasopharyngeal(NP) swabs in vial transport medium  Result Value Ref Range Status   SARS Coronavirus 2 by RT PCR NEGATIVE NEGATIVE Final    Comment: (NOTE) SARS-CoV-2 target nucleic acids are NOT DETECTED.  The SARS-CoV-2 RNA is generally detectable in upper respiratory specimens during the acute phase of infection. The lowest concentration of SARS-CoV-2 viral copies this assay can detect is 138 copies/mL. A negative result does not preclude SARS-Cov-2 infection and should not be used as the sole basis for  treatment or other patient management decisions. A negative result may occur with  improper specimen collection/handling, submission of specimen other than nasopharyngeal swab, presence of viral mutation(s) within the areas targeted by this assay, and inadequate number of viral copies(<138 copies/mL). A negative result must be combined with clinical observations, patient history, and epidemiological information. The expected result is Negative.  Fact Sheet for Patients:  EntrepreneurPulse.com.au  Fact Sheet for Healthcare Providers:  IncredibleEmployment.be  This test is no t yet approved or cleared by the [place] FDA and  has been  authorized for detection and/or diagnosis of SARS-CoV-2 by FDA under an Emergency Use Authorization (EUA). This EUA will remain  in effect (meaning this test can be used) for the duration of the COVID-19 declaration under Section 564(b)(1) of the Act, 21 U.S.C.section 360bbb-3(b)(1), unless the authorization is terminated  or revoked sooner.       Influenza A by PCR NEGATIVE NEGATIVE Final   Influenza B by PCR NEGATIVE NEGATIVE Final    Comment: (NOTE) The Xpert Xpress SARS-CoV-2/FLU/RSV plus assay is intended as an aid in the diagnosis of influenza from Nasopharyngeal swab specimens and should not be used as a sole basis for treatment. Nasal washings and aspirates are unacceptable for Xpert Xpress SARS-CoV-2/FLU/RSV testing.  Fact Sheet for Patients: EntrepreneurPulse.com.au  Fact Sheet for Healthcare Providers: IncredibleEmployment.be  This test is not yet approved or cleared by the [place] FDA and has been authorized for detection and/or diagnosis of SARS-CoV-2 by FDA under an Emergency Use Authorization (EUA). This EUA will remain in effect (meaning this test can be used) for the duration of the COVID-19 declaration under Section 564(b)(1) of the Act, 21 U.S.C. section 360bbb-3(b)(1), unless the authorization is terminated or revoked.  Performed at [facility], [address] [place], [place] [address]   MRSA PCR Screening     Status: None   Collection Time: 02/03/20  1:45 PM   Specimen: Nasopharyngeal  Result Value Ref Range Status   MRSA by PCR NEGATIVE NEGATIVE Final    Comment:        The GeneXpert MRSA Assay (FDA approved for NASAL specimens only), is one component of a comprehensive MRSA colonization surveillance program. It is not intended to diagnose MRSA infection nor to guide or monitor treatment for MRSA infections. Performed at [facility], [address] [place], [place] [address]      Procedures and diagnostic studies:  No results found.    LOS: 5 days   George Hugh  [facility]   Pager on [email or web address]. If 7PM-7AM, please contact night-coverage at [email or web address]   02/08/2020, 6:20 PM

## 2020-02-08 NOTE — Progress Notes (Signed)
[facility], [place] 02/08/20  Subjective:   LOS: 5 Patient seen and evaluated in dialysis unit . She is more awake and communicative  today.    HEMODIALYSIS FLOWSHEET:  Blood Flow Rate (mL/min): 100 mL/min Arterial Pressure (mmHg): -80 mmHg Venous Pressure (mmHg): 70 mmHg Transmembrane Pressure (mmHg): 20 mmHg Ultrafiltration Rate (mL/min): 70 mL/min Dialysate Flow Rate (mL/min): 600 ml/min Conductivity: Machine : 13.6 Conductivity: Machine : 13.6 Dialysis Fluid Bolus: Normal Saline Bolus Amount (mL): 250 mL   Objective:  Vital signs in last 24 hours:  Temp:  [97.4 F (36.3 C)-99.9 F (37.7 C)] 98.7 F (37.1 C) (12/06 1037) Pulse Rate:  [61-67] 63 (12/06 1345) Resp:  [16-23] 19 (12/06 1345) BP: (110-144)/(30-46) 132/46 (12/06 1345) SpO2:  [90 %-100 %] 99 % (12/06 1037)  Weight change:  Filed Weights   02/04/20 1528 02/05/20 1412 02/06/20 0500  Weight: 74.8 kg 74.8 kg 68.3 kg    Intake/Output:    Intake/Output Summary (Last 24 hours) at 02/08/2020 1500 Last data filed at 02/08/2020 1345 Gross per 24 hour  Intake 30 ml  Output 500 ml  Net -470 ml    Physical Exam: General:  Lying in bed, receiving dialysis treatment  HEENT  Normocephalic,atraumatic  Pulm/lungs  Lungs clear, respirations symmetrical,unlabored, 3L supplemental O2 with SpO2 100%  CVS/Heart  Regular, HR in 60's  Abdomen:   Soft, non tender,non distended  Extremities:  No  peripheral edema  Neurologic: Sleeping but awake to call, answers simple questions appropriately  Skin:  No acute lesions or rashes  Left arm AVG Right femoral tunneled catheter by Dr. Lucky Cowboy on 02/05/2020      Basic Metabolic Panel:  Recent Labs  Lab 02/02/20 1941 02/02/20 1941 02/03/20 0434 02/03/20 0434 02/04/20 2241 02/05/20 0629 02/07/20 0818 02/08/20 0926  NA 139  --  136  --   --  139 136 136  K 3.7   < > 3.5  --  3.7 3.8 4.0 3.5  CL 98  --  96*  --   --  98 96* 97*  CO2 22  --   24  --   --  22 30 29   GLUCOSE 67*  --  117*  --   --  78 79 85  BUN 68*  --  70*  --   --  81* 18 24*  CREATININE 16.46*  --  16.65*  --   --  17.97* 5.31* 6.32*  CALCIUM 8.5*   < > 8.4*   < >  --  8.3* 8.6* 8.3*  PHOS  --   --   --   --   --  10.0*  --  3.6   < > = values in this interval not displayed.     CBC: Recent Labs  Lab 02/02/20 1941 02/03/20 0434  WBC 6.1 6.3  NEUTROABS 5.0  --   HGB 8.9* 8.2*  HCT 27.6* 25.8*  MCV 90.8 91.2  PLT 138* 130*      Lab Results  Component Value Date   HEPBSAG NON REACTIVE 10/08/2019      Microbiology:  Recent Results (from the past 240 hour(s))  Resp Panel by RT-PCR (Flu A&B, Covid) Nasopharyngeal Swab     Status: None   Collection Time: 02/02/20  7:36 PM   Specimen: Nasopharyngeal Swab; Nasopharyngeal(NP) swabs in vial transport medium  Result Value Ref Range Status   SARS Coronavirus 2 by RT PCR NEGATIVE NEGATIVE Final    Comment: (NOTE)  SARS-CoV-2 target nucleic acids are NOT DETECTED.  The SARS-CoV-2 RNA is generally detectable in upper respiratory specimens during the acute phase of infection. The lowest concentration of SARS-CoV-2 viral copies this assay can detect is 138 copies/mL. A negative result does not preclude SARS-Cov-2 infection and should not be used as the sole basis for treatment or other patient management decisions. A negative result may occur with  improper specimen collection/handling, submission of specimen other than nasopharyngeal swab, presence of viral mutation(s) within the areas targeted by this assay, and inadequate number of viral copies(<138 copies/mL). A negative result must be combined with clinical observations, patient history, and epidemiological information. The expected result is Negative.  Fact Sheet for Patients:  EntrepreneurPulse.com.au  Fact Sheet for Healthcare Providers:  IncredibleEmployment.be  This test is no t yet approved or  cleared by the [place] FDA and  has been authorized for detection and/or diagnosis of SARS-CoV-2 by FDA under an Emergency Use Authorization (EUA). This EUA will remain  in effect (meaning this test can be used) for the duration of the COVID-19 declaration under Section 564(b)(1) of the Act, 21 U.S.C.section 360bbb-3(b)(1), unless the authorization is terminated  or revoked sooner.       Influenza A by PCR NEGATIVE NEGATIVE Final   Influenza B by PCR NEGATIVE NEGATIVE Final    Comment: (NOTE) The Xpert Xpress SARS-CoV-2/FLU/RSV plus assay is intended as an aid in the diagnosis of influenza from Nasopharyngeal swab specimens and should not be used as a sole basis for treatment. Nasal washings and aspirates are unacceptable for Xpert Xpress SARS-CoV-2/FLU/RSV testing.  Fact Sheet for Patients: EntrepreneurPulse.com.au  Fact Sheet for Healthcare Providers: IncredibleEmployment.be  This test is not yet approved or cleared by the [place] FDA and has been authorized for detection and/or diagnosis of SARS-CoV-2 by FDA under an Emergency Use Authorization (EUA). This EUA will remain in effect (meaning this test can be used) for the duration of the COVID-19 declaration under Section 564(b)(1) of the Act, 21 U.S.C. section 360bbb-3(b)(1), unless the authorization is terminated or revoked.  Performed at [facility], [address] [place], [place] [address]   MRSA PCR Screening     Status: None   Collection Time: 02/03/20  1:45 PM   Specimen: Nasopharyngeal  Result Value Ref Range Status   MRSA by PCR NEGATIVE NEGATIVE Final    Comment:        The GeneXpert MRSA Assay (FDA approved for NASAL specimens only), is one component of a comprehensive MRSA colonization surveillance program. It is not intended to diagnose MRSA infection nor to guide or monitor treatment for MRSA infections. Performed at [facility],  [address] [place], [place] [address]     Coagulation Studies: No results for input(s): LABPROT, INR in the last 72 hours.  Urinalysis: No results for input(s): COLORURINE, LABSPEC, PHURINE, GLUCOSEU, HGBUR, BILIRUBINUR, KETONESUR, PROTEINUR, UROBILINOGEN, NITRITE, LEUKOCYTESUR in the last 72 hours.  Invalid input(s): APPERANCEUR    Imaging: No results found.   Medications:    . amiodarone  100 mg Oral Daily  . atorvastatin  40 mg Oral QHS  . Chlorhexidine Gluconate Cloth  6 each Topical Q0600  . epoetin (EPOGEN/PROCRIT) injection  10,000 Units Intravenous Q T,Th,Sa-HD  . feeding supplement (NEPRO CARB STEADY)  237 mL Oral BID BM  . FLUoxetine  20 mg Oral QHS  . folic acid  1 mg Oral Daily  . gabapentin  100 mg Oral QHS  . heparin injection (subcutaneous)  5,000 Units Subcutaneous Q12H  . hydrALAZINE  10 mg Oral BID  . insulin aspart  0-15 Units Subcutaneous TID WC  . insulin aspart  0-5 Units Subcutaneous QHS  . levothyroxine  88 mcg Oral Q0600  . lidocaine-prilocaine   Topical Once  . pantoprazole  40 mg Oral Daily   acetaminophen **OR** acetaminophen, fluticasone, heparin, HYDROmorphone (DILAUDID) injection, melatonin, ondansetron **OR** ondansetron (ZOFRAN) IV, ondansetron (ZOFRAN) IV  Assessment/ Plan:  64 y.o. female with  was admitted on 02/02/2020 for  Principal Problem:   Acute metabolic encephalopathy Active Problems:   Hypothyroid   HTN (hypertension)   Type 2 diabetes mellitus with ESRD (end-stage renal disease) (HCC)   Coronary artery disease   Atrial fibrillation, chronic (HCC)   Depression   Chronic diastolic CHF (congestive heart failure) (HCC)   ESRD (end stage renal disease) (HCC)   Anemia of chronic renal failure, stage 5 (HCC)   Hypoglycemia   Functional quadriplegia (HCC)  ESRD on dialysis ([place]) [N18.6, Z99.2] AMS (altered mental status) [T24.58] Acute metabolic encephalopathy [K99.83]  #. ESRD on HD MWF [place], MWF,Rt.Femoral cath Patient receiving dialysis treatment today Tolerating well Will continue MWF schedule   # Complication of Dialysis access Patient underwent fistulogram and stent placement of left AV graft on 02/04/2020, Ongoing issues with cannulation reported by dialysis staff Right femoral tunneled catheter placed by vascular team on 02/05/2020 Dialysis treatments restarted on 02/05/2020  #. Anemia of CKD  Lab Results  Component Value Date   HGB 8.2 (L) 02/03/2020   Continue Epogen with dialysis treatments MWF  #. Secondary hyperparathyroidism of renal origin N 25.81      Component Value Date/Time   PTH 83 (H) 10/09/2019 0847   Lab Results  Component Value Date   PHOS 3.6 02/08/2020  No acute indication for Phos binders  #. Diabetes type 2 with CKD Hemoglobin A1C (no units)  Date Value  08/16/2017 7.1   Hgb A1c MFr Bld (%)  Date Value  02/03/2020 4.6 (L)   Blood glucose readings at low normal range Encourage PO intake   LOS: [place] 12/6/20213:00 PM  [facility] [place], [place]

## 2020-02-08 NOTE — Progress Notes (Signed)
Hypoglycemic Event  CBG: 70  Treatment: D50 25 mL (12.5 gm)  Symptoms: Sweaty  Follow-up CBG: Time: 2130 CBG Result: 128  Possible Reasons for Event: Inadequate meal intake  Comments/MD notified: Notified NP that CBG has been consistently dropping throughout the day. Followed up with low CBG with nepro protein shake. New orders for CBG every 4 hours.     Beverly Sessions

## 2020-02-09 ENCOUNTER — Inpatient Hospital Stay: Payer: Medicare Other

## 2020-02-09 LAB — CBC
HCT: 23.9 % — ABNORMAL LOW (ref 36.0–46.0)
Hemoglobin: 7.5 g/dL — ABNORMAL LOW (ref 12.0–15.0)
MCH: 28.8 pg (ref 26.0–34.0)
MCHC: 31.4 g/dL (ref 30.0–36.0)
MCV: 91.9 fL (ref 80.0–100.0)
Platelets: 125 10*3/uL — ABNORMAL LOW (ref 150–400)
RBC: 2.6 MIL/uL — ABNORMAL LOW (ref 3.87–5.11)
RDW: 15.9 % — ABNORMAL HIGH (ref 11.5–15.5)
WBC: 4.9 10*3/uL (ref 4.0–10.5)
nRBC: 0 % (ref 0.0–0.2)

## 2020-02-09 LAB — BASIC METABOLIC PANEL
Anion gap: 8 (ref 5–15)
BUN: 9 mg/dL (ref 8–23)
CO2: 30 mmol/L (ref 22–32)
Calcium: 8.4 mg/dL — ABNORMAL LOW (ref 8.9–10.3)
Chloride: 98 mmol/L (ref 98–111)
Creatinine, Ser: 3.39 mg/dL — ABNORMAL HIGH (ref 0.44–1.00)
GFR, Estimated: 14 mL/min — ABNORMAL LOW (ref >60)
Glucose, Bld: 82 mg/dL (ref 70–99)
Potassium: 3.6 mmol/L (ref 3.5–5.1)
Sodium: 136 mmol/L (ref 135–145)

## 2020-02-09 LAB — CORTISOL: Cortisol, Plasma: 15.8 ug/dL

## 2020-02-09 LAB — GLUCOSE, CAPILLARY
Glucose-Capillary: 119 mg/dL — ABNORMAL HIGH (ref 70–99)
Glucose-Capillary: 136 mg/dL — ABNORMAL HIGH (ref 70–99)
Glucose-Capillary: 159 mg/dL — ABNORMAL HIGH (ref 70–99)
Glucose-Capillary: 78 mg/dL (ref 70–99)
Glucose-Capillary: 82 mg/dL (ref 70–99)
Glucose-Capillary: 88 mg/dL (ref 70–99)

## 2020-02-09 MED ORDER — DEXTROSE 5 % IV SOLN: INTRAVENOUS | Status: DC

## 2020-02-09 NOTE — Care Management Important Message (Signed)
Important Message  Patient Details  Name: Jamie Arias MRN: [id number] Date of Birth: 02-13-1956   Medicare Important Message Given:  Yes     Dannette Barbara 02/09/2020, 8:21 AM

## 2020-02-09 NOTE — Progress Notes (Addendum)
[facility], [place] 02/09/20  Subjective:   LOS: 6 Patient received dialysis treatment yesterday.  Patient has hypoglycemic episodes reported this morning.  As per nursing staff she was lethargic earlier in the morning.  Patient appears more alert and oriented at this time, sitting in bed and  consuming her breakfast.    Objective:  Vital signs in last 24 hours:  Temp:  [98.6 F (37 C)-99.1 F (37.3 C)] 99.1 F (37.3 C) (12/07 1114) Pulse Rate:  [60-64] 62 (12/07 1114) Resp:  [12-20] 12 (12/07 1114) BP: (132-154)/(30-54) 154/54 (12/07 1114) SpO2:  [100 %] 100 % (12/07 1114)  Weight change:  Filed Weights   02/04/20 1528 02/05/20 1412 02/06/20 0500  Weight: 74.8 kg 74.8 kg 68.3 kg    Intake/Output:    Intake/Output Summary (Last 24 hours) at 02/09/2020 1312 Last data filed at 02/09/2020 1009 Gross per 24 hour  Intake 400 ml  Output 500 ml  Net -100 ml    Physical Exam: General:  Resting in bed, in no acute distress  HEENT  oral mucous membranes appear moist  Pulm/lungs  respirations even, unlabored, lungs clear  CVS/Heart  S1-S2, no rubs or gallops  Abdomen:   Soft, non tender,non distended  Extremities:  No  peripheral edema  Neurologic:  Awake, alert, answers simple questions appropriately  Skin:  No acute lesions or rashes  Left arm AVG Right femoral tunneled catheter by Dr. Lucky Cowboy on 02/05/2020      Basic Metabolic Panel:  Recent Labs  Lab 02/03/20 0434 02/03/20 0434 02/04/20 2241 02/05/20 0737 02/05/20 1062 02/07/20 0818 02/08/20 0926 02/09/20 0342  NA 136  --   --  139  --  136 136 136  K 3.5   < > 3.7 3.8  --  4.0 3.5 3.6  CL 96*  --   --  98  --  96* 97* 98  CO2 24  --   --  22  --  30 29 30   GLUCOSE 117*  --   --  78  --  79 85 82  BUN 70*  --   --  81*  --  18 24* 9  CREATININE 16.65*  --   --  17.97*  --  5.31* 6.32* 3.39*  CALCIUM 8.4*   < >  --  8.3*   < > 8.6* 8.3* 8.4*  PHOS  --   --   --  10.0*  --   --   3.6  --    < > = values in this interval not displayed.     CBC: Recent Labs  Lab 02/02/20 1941 02/03/20 0434 02/09/20 0342  WBC 6.1 6.3 4.9  NEUTROABS 5.0  --   --   HGB 8.9* 8.2* 7.5*  HCT 27.6* 25.8* 23.9*  MCV 90.8 91.2 91.9  PLT 138* 130* 125*      Lab Results  Component Value Date   HEPBSAG NON REACTIVE 10/08/2019      Microbiology:  Recent Results (from the past 240 hour(s))  Resp Panel by RT-PCR (Flu A&B, Covid) Nasopharyngeal Swab     Status: None   Collection Time: 02/02/20  7:36 PM   Specimen: Nasopharyngeal Swab; Nasopharyngeal(NP) swabs in vial transport medium  Result Value Ref Range Status   SARS Coronavirus 2 by RT PCR NEGATIVE NEGATIVE Final    Comment: (NOTE) SARS-CoV-2 target nucleic acids are NOT DETECTED.  The SARS-CoV-2 RNA is generally detectable in upper respiratory specimens during the  acute phase of infection. The lowest concentration of SARS-CoV-2 viral copies this assay can detect is 138 copies/mL. A negative result does not preclude SARS-Cov-2 infection and should not be used as the sole basis for treatment or other patient management decisions. A negative result may occur with  improper specimen collection/handling, submission of specimen other than nasopharyngeal swab, presence of viral mutation(s) within the areas targeted by this assay, and inadequate number of viral copies(<138 copies/mL). A negative result must be combined with clinical observations, patient history, and epidemiological information. The expected result is Negative.  Fact Sheet for Patients:  EntrepreneurPulse.com.au  Fact Sheet for Healthcare Providers:  IncredibleEmployment.be  This test is no t yet approved or cleared by the [place] FDA and  has been authorized for detection and/or diagnosis of SARS-CoV-2 by FDA under an Emergency Use Authorization (EUA). This EUA will remain  in effect (meaning this test can be  used) for the duration of the COVID-19 declaration under Section 564(b)(1) of the Act, 21 U.S.C.section 360bbb-3(b)(1), unless the authorization is terminated  or revoked sooner.       Influenza A by PCR NEGATIVE NEGATIVE Final   Influenza B by PCR NEGATIVE NEGATIVE Final    Comment: (NOTE) The Xpert Xpress SARS-CoV-2/FLU/RSV plus assay is intended as an aid in the diagnosis of influenza from Nasopharyngeal swab specimens and should not be used as a sole basis for treatment. Nasal washings and aspirates are unacceptable for Xpert Xpress SARS-CoV-2/FLU/RSV testing.  Fact Sheet for Patients: EntrepreneurPulse.com.au  Fact Sheet for Healthcare Providers: IncredibleEmployment.be  This test is not yet approved or cleared by the [place] FDA and has been authorized for detection and/or diagnosis of SARS-CoV-2 by FDA under an Emergency Use Authorization (EUA). This EUA will remain in effect (meaning this test can be used) for the duration of the COVID-19 declaration under Section 564(b)(1) of the Act, 21 U.S.C. section 360bbb-3(b)(1), unless the authorization is terminated or revoked.  Performed at [facility], [address] [place], [place] [address]   MRSA PCR Screening     Status: None   Collection Time: 02/03/20  1:45 PM   Specimen: Nasopharyngeal  Result Value Ref Range Status   MRSA by PCR NEGATIVE NEGATIVE Final    Comment:        The GeneXpert MRSA Assay (FDA approved for NASAL specimens only), is one component of a comprehensive MRSA colonization surveillance program. It is not intended to diagnose MRSA infection nor to guide or monitor treatment for MRSA infections. Performed at [facility], [address] [place], [place] [address]   CULTURE, BLOOD (ROUTINE X 2) w Reflex to ID Panel     Status: None (Preliminary result)   Collection Time: 02/09/20  3:53 AM   Specimen: BLOOD  Result Value Ref  Range Status   Specimen Description BLOOD RIGHT HAND  Final   Special Requests   Final    BOTTLES DRAWN AEROBIC AND ANAEROBIC Blood Culture adequate volume   Culture   Final    NO GROWTH < 12 HOURS Performed at [facility], [address] [place], [place] [address]    Report Status PENDING  Incomplete  CULTURE, BLOOD (ROUTINE X 2) w Reflex to ID Panel     Status: None (Preliminary result)   Collection Time: 02/09/20  3:54 AM   Specimen: BLOOD  Result Value Ref Range Status   Specimen Description BLOOD RIGHT FA  Final   Special Requests   Final  BOTTLES DRAWN AEROBIC AND ANAEROBIC Blood Culture adequate volume   Culture   Final    NO GROWTH < 12 HOURS Performed at [facility], [address] [place],  [address]    Report Status PENDING  Incomplete    Coagulation Studies: No results for input(s): LABPROT, INR in the last 72 hours.  Urinalysis: No results for input(s): COLORURINE, LABSPEC, PHURINE, GLUCOSEU, HGBUR, BILIRUBINUR, KETONESUR, PROTEINUR, UROBILINOGEN, NITRITE, LEUKOCYTESUR in the last 72 hours.  Invalid input(s): APPERANCEUR    Imaging: CT HEAD WO CONTRAST  Result Date: 02/09/2020 CLINICAL DATA:  64 year old with mental status change. EXAM: CT HEAD WITHOUT CONTRAST TECHNIQUE: Contiguous axial images were obtained from the base of the skull through the vertex without intravenous contrast. COMPARISON:  10/11/2019 FINDINGS: Brain: Low-density in the periventricular white matter is similar to the previous examination. Subtle low-density along the right side of the caudate is similar to the prior CT. Negative for acute hemorrhage, mass lesion, midline shift, hydrocephalus or large infarct. Vascular: No hyperdense vessel or unexpected calcification. Skull: Normal. Negative for fracture or focal lesion. Sinuses/Orbits: Fluid in left mastoid air cells. Other: None IMPRESSION: 1. No acute intracranial abnormality. 2. Periventricular low density is  most compatible with chronic small vessel ischemic changes. 3. Small amount of fluid in the left mastoid air cells. Electronically Signed   By: Markus Daft M.D.   On: 02/09/2020 12:05     Medications:    . amiodarone  100 mg Oral Daily  . atorvastatin  40 mg Oral QHS  . Chlorhexidine Gluconate Cloth  6 each Topical Q0600  . epoetin (EPOGEN/PROCRIT) injection  10,000 Units Intravenous Q T,Th,Sa-HD  . feeding supplement (NEPRO CARB STEADY)  237 mL Oral BID BM  . FLUoxetine  20 mg Oral QHS  . folic acid  1 mg Oral Daily  . gabapentin  100 mg Oral QHS  . heparin injection (subcutaneous)  5,000 Units Subcutaneous Q12H  . hydrALAZINE  10 mg Oral BID  . insulin aspart  0-15 Units Subcutaneous TID WC  . insulin aspart  0-5 Units Subcutaneous QHS  . levothyroxine  88 mcg Oral Q0600  . lidocaine-prilocaine   Topical Once  . pantoprazole  40 mg Oral Daily   acetaminophen **OR** acetaminophen, fluticasone, heparin, HYDROmorphone (DILAUDID) injection, melatonin, ondansetron **OR** ondansetron (ZOFRAN) IV, ondansetron (ZOFRAN) IV  Assessment/ Plan:  64 y.o. female with  was admitted on 02/02/2020 for  Principal Problem:   Acute metabolic encephalopathy Active Problems:   Hypothyroid   HTN (hypertension)   Type 2 diabetes mellitus with ESRD (end-stage renal disease) (HCC)   Coronary artery disease   Atrial fibrillation, chronic (HCC)   Depression   Chronic diastolic CHF (congestive heart failure) (HCC)   ESRD (end stage renal disease) (HCC)   Anemia of chronic renal failure, stage 5 (HCC)   Hypoglycemia   Functional quadriplegia (HCC)  ESRD on dialysis ([place]) [N18.6, Z99.2] AMS (altered mental status) [O24.23] Acute metabolic encephalopathy [N36.14]  #. ESRD on HD MWF [place], MWF,Rt.Femoral cath Patient received dialysis treatment yesterday Volume and electrolyte status acceptable No additional dialysis treatment required today We will plan dialysis treatment  MWF schedule   # Complication of Dialysis access Patient underwent fistulogram and stent placement of left AV graft on 02/04/2020, Ongoing issues with cannulation reported by dialysis staff Right femoral tunneled catheter placed by vascular team on 02/05/2020 Dialysis treatments restarted on 02/05/2020  #. Anemia of CKD  Lab Results  Component Value Date  HGB 7.5 (L) 02/09/2020   Epogen with HD  #. Secondary hyperparathyroidism of renal origin N 25.81      Component Value Date/Time   PTH 83 (H) 10/09/2019 0847   Lab Results  Component Value Date   PHOS 3.6 02/08/2020  We will continue monitoring bone mineral metabolism parameters  #. Diabetes type 2 with CKD Hemoglobin A1C (no units)  Date Value  08/16/2017 7.1   Hgb A1c MFr Bld (%)  Date Value  02/03/2020 4.6 (L)   Multiple hypoglycemic episodes during this admission Discussed with Dr.Lateef, attending Nephrologist We will start D5 at low rate Encourage oral intake   LOS: [place] 12/7/20211:12 PM  [place], [place]

## 2020-02-09 NOTE — Progress Notes (Addendum)
Progress Note    Jamie Arias  GNF:[id number] DOB: 1955-10-18  DOA: 02/02/2020 PCP: [name], MD    Brief Narrative:    Medical records reviewed and are as summarized below:  Jamie Arias is a 64 y.o. female with PMH of ESRD on HD MWF, chronic diastolic failure, Afib, CAD, and physical deconditioning who is wheelchair bound who presents to ED with altered mental status after missing 5 dialysis sessions.  Patient is improving slowly with dialysis.   Assessment/Plan:   Principal Problem:   Acute metabolic encephalopathy Active Problems:   Hypothyroid   HTN (hypertension)   Type 2 diabetes mellitus with ESRD (end-stage renal disease) (HCC)   Coronary artery disease   Atrial fibrillation, chronic (HCC)   Depression   Chronic diastolic CHF (congestive heart failure) (HCC)   ESRD (end stage renal disease) (HCC)   Anemia of chronic renal failure, stage 5 (HCC)   Hypoglycemia   Functional quadriplegia (HCC)  Body mass index is 25.85 kg/m.   Acute Metabolic Encephalopathy due to uremia and dialysis non-adherence with component of hypoglycemia:  - Head CT showed no acute abnormality. - Continue dialysis until mental status improves.   - Glucose runs low in the am.  I started D5 infusion at 50 CC/hr x 4 hours and glucose improved.  Will monitor.  ESRD on HD MWF Left arm AV graft access is still dysfunctional and it cannot be used for hemodialysis.   12/2 - left arm shuntogram with stent placement left AV graft .   12/3 - right femoral permacath placed.   12/4 - hemodialysis.   - Nephrology following, appreciate assistance and recommendations.  Hypertension:  - BP stable.  Continue meds.  Coronary Artery Disease - Not on antiplatelets due to history of severe GI bleed. - Continue Statin.  Chronic Diastolic Heart Failure - Lasix PRN.  Paroxysmal atrial fibrillation: No acute issues.  - Continue Amiodarone for rhythm control. - Not on anticoagulation  due to history of severe GI bleed.  This decision was made by patient and her PCP. - Monitor on telemetry.  Anemia of chronic disease: H&H is stable.   - Continue Epogen with dialysis.  Type II DM with recurrent hypoglycemia, poor oral intake: Hemoglobin A1c is 4.6%.  - Monitor glucose levels closely.  IV dextrose as needed for hypoglycemia.  Encourage adequate oral intake.  Patient is bedbound and wheelchair-bound.   - Husband said she has not been able to walk since he had COVID-19 infection in June 2020.  Stage II sacral decubitus ulcer (present on admission):  - Continue local wound care. Turn patient every 2 hours.   Pressure Injury 02/06/20 Sacrum Left Stage 2 -  Partial thickness loss of dermis presenting as a shallow open injury with a red, pink wound bed without slough. (Active)  02/06/20 1427  Location: Sacrum  Location Orientation: Left  Staging: Stage 2 -  Partial thickness loss of dermis presenting as a shallow open injury with a red, pink wound bed without slough.  Wound Description (Comments):   Present on Admission:      Diet Order            Diet renal with fluid restriction Fluid restriction: 1200 mL Fluid; Room service appropriate? Yes; Fluid consistency: Thin  Diet effective now                 Consultants:  Nephrologist  Vascular surgeon  Procedures:  Left arm shuntogram with stent  placement to AV graft on 02/04/2020  Placement of tunneled hemodialysis catheter via right femoral vein on 02/05/2020   Medications:   . amiodarone  100 mg Oral Daily  . atorvastatin  40 mg Oral QHS  . Chlorhexidine Gluconate Cloth  6 each Topical Q0600  . epoetin (EPOGEN/PROCRIT) injection  10,000 Units Intravenous Q T,Th,Sa-HD  . feeding supplement (NEPRO CARB STEADY)  237 mL Oral BID BM  . FLUoxetine  20 mg Oral QHS  . folic acid  1 mg Oral Daily  . gabapentin  100 mg Oral QHS  . heparin injection (subcutaneous)  5,000 Units Subcutaneous Q12H  . hydrALAZINE   10 mg Oral BID  . insulin aspart  0-15 Units Subcutaneous TID WC  . insulin aspart  0-5 Units Subcutaneous QHS  . levothyroxine  88 mcg Oral Q0600  . lidocaine-prilocaine   Topical Once  . pantoprazole  40 mg Oral Daily   Continuous Infusions: . dextrose 30 mL/hr at 02/09/20 1700     Anti-infectives (From admission, onward)   Start     Dose/Rate Route Frequency Ordered Stop   02/06/20 0000  ceFAZolin (ANCEF) IVPB 1 g/50 mL premix       Note to Pharmacy: To be given in specials   1 g 100 mL/hr over 30 Minutes Intravenous  Once 02/05/20 1413 02/05/20 1624   02/05/20 1417  ceFAZolin (ANCEF) 1-4 GM/50ML-% IVPB       Note to Pharmacy: Maynor, Erin   : cabinet override      02/05/20 1417 02/05/20 1624   02/04/20 1515  ceFAZolin (ANCEF) IVPB 1 g/50 mL premix  Status:  Discontinued        1 g 100 mL/hr over 30 Minutes Intravenous  Once 02/04/20 1507 02/04/20 1703        Family Communication/Anticipated D/C date and plan/Code Status   DVT prophylaxis:      Code Status: Full Code  Family Communication: None Disposition Plan:   Status is: Inpatient  Remains inpatient appropriate because:Inpatient level of care appropriate due to severity of illness   Dispo: The patient is from: Home              Anticipated d/c is to: Home              Anticipated d/c date is: 1 day              Patient currently is not medically stable to d/c.   Subjective:   Patient remains a little drowsy and lethargic.  Per husband, she is looking better since she arrived to hospital. She denies any headaches, chest pain, or shortness of breath.  Objective:    Vitals:   02/09/20 0310 02/09/20 0918 02/09/20 1114 02/09/20 1537  BP: (!) 143/48 (!) 142/44 (!) 154/54 (!) 150/52  Pulse: 60 60 62 (!) 57  Resp: 16 18 12 14   Temp: 99.1 F (37.3 C)  99.1 F (37.3 C) 99 F (37.2 C)  TempSrc: Oral  Oral Oral  SpO2: 100% 100% 100% 100%  Weight:      Height:       No data found.   Intake/Output  Summary (Last 24 hours) at 02/09/2020 1733 Last data filed at 02/09/2020 1700 Gross per 24 hour  Intake 678.5 ml  Output --  Net 678.5 ml   Filed Weights   02/04/20 1528 02/05/20 1412 02/06/20 0500  Weight: 74.8 kg 74.8 kg 68.3 kg    Exam:  GEN: NAD SKIN: Warm and  dry EYES: No pallor or icterus ENT: MMM CV: RRR, did not appreciate a murmur. PULM: CTA B ABD: soft, ND, NT, +BS CNS: Alert, oriented to person and place.  No focal deficits.  Tremors of bilateral upper extremities. EXT: No edema or tenderness    Data Reviewed:   I have personally reviewed following labs and imaging studies:  Labs: Labs show the following:   Basic Metabolic Panel: Recent Labs  Lab 02/03/20 0434 02/04/20 2241 02/05/20 0629 02/05/20 0629 02/07/20 0818 02/07/20 0818 02/08/20 0926 02/09/20 0342  NA 136  --  139  --  136  --  136 136  K 3.5   < > 3.8   < > 4.0   < > 3.5 3.6  CL 96*  --  98  --  96*  --  97* 98  CO2 24  --  22  --  30  --  29 30  GLUCOSE 117*  --  78  --  79  --  85 82  BUN 70*  --  81*  --  18  --  24* 9  CREATININE 16.65*  --  17.97*  --  5.31*  --  6.32* 3.39*  CALCIUM 8.4*  --  8.3*  --  8.6*  --  8.3* 8.4*  PHOS  --   --  10.0*  --   --   --  3.6  --    < > = values in this interval not displayed.   GFR Estimated Creatinine Clearance: 15.5 mL/min (A) (by C-G formula based on SCr of 3.39 mg/dL (H)). Liver Function Tests: Recent Labs  Lab 02/05/20 0629  ALBUMIN 2.3*    CBC: Recent Labs  Lab 02/02/20 1941 02/03/20 0434 02/09/20 0342  WBC 6.1 6.3 4.9  NEUTROABS 5.0  --   --   HGB 8.9* 8.2* 7.5*  HCT 27.6* 25.8* 23.9*  MCV 90.8 91.2 91.9  PLT 138* 130* 125*   CBG: Recent Labs  Lab 02/09/20 0041 02/09/20 0416 02/09/20 0752 02/09/20 1115 02/09/20 1538  GLUCAP 88 78 82 159* 136*   Sepsis Labs: Recent Labs  Lab 02/02/20 1941 02/03/20 0434 02/09/20 0342  WBC 6.1 6.3 4.9    Microbiology Recent Results (from the past 240 hour(s))  Resp Panel  by RT-PCR (Flu A&B, Covid) Nasopharyngeal Swab     Status: None   Collection Time: 02/02/20  7:36 PM   Specimen: Nasopharyngeal Swab; Nasopharyngeal(NP) swabs in vial transport medium  Result Value Ref Range Status   SARS Coronavirus 2 by RT PCR NEGATIVE NEGATIVE Final    Comment: (NOTE) SARS-CoV-2 target nucleic acids are NOT DETECTED.  The SARS-CoV-2 RNA is generally detectable in upper respiratory specimens during the acute phase of infection. The lowest concentration of SARS-CoV-2 viral copies this assay can detect is 138 copies/mL. A negative result does not preclude SARS-Cov-2 infection and should not be used as the sole basis for treatment or other patient management decisions. A negative result may occur with  improper specimen collection/handling, submission of specimen other than nasopharyngeal swab, presence of viral mutation(s) within the areas targeted by this assay, and inadequate number of viral copies(<138 copies/mL). A negative result must be combined with clinical observations, patient history, and epidemiological information. The expected result is Negative.  Fact Sheet for Patients:  EntrepreneurPulse.com.au  Fact Sheet for Healthcare Providers:  IncredibleEmployment.be  This test is no t yet approved or cleared by the [place] FDA and  has been authorized for detection  and/or diagnosis of SARS-CoV-2 by FDA under an Emergency Use Authorization (EUA). This EUA will remain  in effect (meaning this test can be used) for the duration of the COVID-19 declaration under Section 564(b)(1) of the Act, 21 U.S.C.section 360bbb-3(b)(1), unless the authorization is terminated  or revoked sooner.       Influenza A by PCR NEGATIVE NEGATIVE Final   Influenza B by PCR NEGATIVE NEGATIVE Final    Comment: (NOTE) The Xpert Xpress SARS-CoV-2/FLU/RSV plus assay is intended as an aid in the diagnosis of influenza from Nasopharyngeal swab  specimens and should not be used as a sole basis for treatment. Nasal washings and aspirates are unacceptable for Xpert Xpress SARS-CoV-2/FLU/RSV testing.  Fact Sheet for Patients: EntrepreneurPulse.com.au  Fact Sheet for Healthcare Providers: IncredibleEmployment.be  This test is not yet approved or cleared by the [place] FDA and has been authorized for detection and/or diagnosis of SARS-CoV-2 by FDA under an Emergency Use Authorization (EUA). This EUA will remain in effect (meaning this test can be used) for the duration of the COVID-19 declaration under Section 564(b)(1) of the Act, 21 U.S.C. section 360bbb-3(b)(1), unless the authorization is terminated or revoked.  Performed at [facility], [address] [place], [place] [address]   MRSA PCR Screening     Status: None   Collection Time: 02/03/20  1:45 PM   Specimen: Nasopharyngeal  Result Value Ref Range Status   MRSA by PCR NEGATIVE NEGATIVE Final    Comment:        The GeneXpert MRSA Assay (FDA approved for NASAL specimens only), is one component of a comprehensive MRSA colonization surveillance program. It is not intended to diagnose MRSA infection nor to guide or monitor treatment for MRSA infections. Performed at [facility], [address] [place], [place] [address]   CULTURE, BLOOD (ROUTINE X 2) w Reflex to ID Panel     Status: None (Preliminary result)   Collection Time: 02/09/20  3:53 AM   Specimen: BLOOD  Result Value Ref Range Status   Specimen Description BLOOD RIGHT HAND  Final   Special Requests   Final    BOTTLES DRAWN AEROBIC AND ANAEROBIC Blood Culture adequate volume   Culture   Final    NO GROWTH < 12 HOURS Performed at [facility], [address], [place], [place] [address]    Report Status PENDING  Incomplete  CULTURE, BLOOD (ROUTINE X 2) w Reflex to ID Panel     Status: None (Preliminary result)   Collection  Time: 02/09/20  3:54 AM   Specimen: BLOOD  Result Value Ref Range Status   Specimen Description BLOOD RIGHT FA  Final   Special Requests   Final    BOTTLES DRAWN AEROBIC AND ANAEROBIC Blood Culture adequate volume   Culture   Final    NO GROWTH < 12 HOURS Performed at [facility], [address] [place], [place] [address]    Report Status PENDING  Incomplete    Procedures and diagnostic studies:  CT HEAD WO CONTRAST  Result Date: 02/09/2020 CLINICAL DATA:  64 year old with mental status change. EXAM: CT HEAD WITHOUT CONTRAST TECHNIQUE: Contiguous axial images were obtained from the base of the skull through the vertex without intravenous contrast. COMPARISON:  10/11/2019 FINDINGS: Brain: Low-density in the periventricular white matter is similar to the previous examination. Subtle low-density along the right side of the caudate is similar to the prior CT. Negative for acute hemorrhage, mass lesion, midline shift, hydrocephalus or  large infarct. Vascular: No hyperdense vessel or unexpected calcification. Skull: Normal. Negative for fracture or focal lesion. Sinuses/Orbits: Fluid in left mastoid air cells. Other: None IMPRESSION: 1. No acute intracranial abnormality. 2. Periventricular low density is most compatible with chronic small vessel ischemic changes. 3. Small amount of fluid in the left mastoid air cells. Electronically Signed   By: Markus Daft M.D.   On: 02/09/2020 12:05      LOS: 6 days   George Hugh  [facility]   Pager on [email or web address]. If 7PM-7AM, please contact night-coverage at [email or web address]   02/09/2020, 5:33 PM

## 2020-02-10 LAB — GLUCOSE, CAPILLARY
Glucose-Capillary: 107 mg/dL — ABNORMAL HIGH (ref 70–99)
Glucose-Capillary: 185 mg/dL — ABNORMAL HIGH (ref 70–99)
Glucose-Capillary: 79 mg/dL (ref 70–99)
Glucose-Capillary: 80 mg/dL (ref 70–99)
Glucose-Capillary: 95 mg/dL (ref 70–99)
Glucose-Capillary: 99 mg/dL (ref 70–99)

## 2020-02-10 LAB — BASIC METABOLIC PANEL
Anion gap: 7 (ref 5–15)
BUN: 12 mg/dL (ref 8–23)
CO2: 30 mmol/L (ref 22–32)
Calcium: 8.2 mg/dL — ABNORMAL LOW (ref 8.9–10.3)
Chloride: 96 mmol/L — ABNORMAL LOW (ref 98–111)
Creatinine, Ser: 4.37 mg/dL — ABNORMAL HIGH (ref 0.44–1.00)
GFR, Estimated: 11 mL/min — ABNORMAL LOW (ref >60)
Glucose, Bld: 104 mg/dL — ABNORMAL HIGH (ref 70–99)
Potassium: 3.3 mmol/L — ABNORMAL LOW (ref 3.5–5.1)
Sodium: 133 mmol/L — ABNORMAL LOW (ref 135–145)

## 2020-02-10 LAB — CBC
HCT: 25.3 % — ABNORMAL LOW (ref 36.0–46.0)
Hemoglobin: 7.8 g/dL — ABNORMAL LOW (ref 12.0–15.0)
MCH: 28.6 pg (ref 26.0–34.0)
MCHC: 30.8 g/dL (ref 30.0–36.0)
MCV: 92.7 fL (ref 80.0–100.0)
Platelets: 131 10*3/uL — ABNORMAL LOW (ref 150–400)
RBC: 2.73 MIL/uL — ABNORMAL LOW (ref 3.87–5.11)
RDW: 15.9 % — ABNORMAL HIGH (ref 11.5–15.5)
WBC: 5.6 10*3/uL (ref 4.0–10.5)
nRBC: 0 % (ref 0.0–0.2)

## 2020-02-10 LAB — MAGNESIUM: Magnesium: 1.9 mg/dL (ref 1.7–2.4)

## 2020-02-10 LAB — PHOSPHORUS: Phosphorus: 2.7 mg/dL (ref 2.5–4.6)

## 2020-02-10 MED ORDER — EPOETIN ALFA 10000 UNIT/ML IJ SOLN
10000.0000 [IU] | INTRAMUSCULAR | Status: DC
  Administered 2020-02-12: 10000 [IU] via INTRAVENOUS

## 2020-02-10 MED ORDER — POTASSIUM CHLORIDE CRYS ER 20 MEQ PO TBCR
40.0000 meq | EXTENDED_RELEASE_TABLET | Freq: Once | ORAL | Status: AC
  Administered 2020-02-10: 40 meq via ORAL
  Filled 2020-02-10: qty 2

## 2020-02-10 NOTE — Progress Notes (Signed)
[name], [place] 02/10/20  Subjective:   LOS: 7 Patient sleeping in bed, woke up to call, still appears little drowsy. She denies SOB,nausea or vomiting. Oral intake appears to be poor.    Objective:  Vital signs in last 24 hours:  Temp:  [98.2 F (36.8 C)-99.6 F (37.6 C)] 98.2 F (36.8 C) (12/08 1124) Pulse Rate:  [54-60] 58 (12/08 1630) Resp:  [14-20] 15 (12/08 1630) BP: (99-154)/(35-52) 118/48 (12/08 1630) SpO2:  [75 %-100 %] 96 % (12/08 1630)  Weight change:  Filed Weights   02/04/20 1528 02/05/20 1412 02/06/20 0500  Weight: 74.8 kg 74.8 kg 68.3 kg    Intake/Output:    Intake/Output Summary (Last 24 hours) at 02/10/2020 1658 Last data filed at 02/10/2020 1000 Gross per 24 hour  Intake 782.79 ml  Output 0 ml  Net 782.79 ml    Physical Exam: General:  In no acute distress  HEENT  Normocephalic,atraumatic  Pulm/lungs  Lungs clear to auscultation bilaterally, normal respiratory effort  CVS/Heart  Regular rate and rhythm  Abdomen:   Soft, non tender,non distended  Extremities:  No  peripheral edema  Neurologic:  lethargic, drowsy  Skin:  No acute lesions or rashes  Left arm AVG Right femoral tunneled catheter by Dr. Lucky Arias on 02/05/2020      Basic Metabolic Panel:  Recent Labs  Lab 02/05/20 0160 02/05/20 1093 02/07/20 0818 02/07/20 0818 02/08/20 0926 02/09/20 0342 02/10/20 0346  NA 139  --  136  --  136 136 133*  K 3.8  --  4.0  --  3.5 3.6 3.3*  CL 98  --  96*  --  97* 98 96*  CO2 22  --  30  --  29 30 30   GLUCOSE 78  --  79  --  85 82 104*  BUN 81*  --  18  --  24* 9 12  CREATININE 17.97*  --  5.31*  --  6.32* 3.39* 4.37*  CALCIUM 8.3*   < > 8.6*   < > 8.3* 8.4* 8.2*  MG  --   --   --   --   --   --  1.9  PHOS 10.0*  --   --   --  3.6  --  2.7   < > = values in this interval not displayed.     CBC: Recent Labs  Lab 02/09/20 0342 02/10/20 0346  WBC 4.9 5.6  HGB 7.5* 7.8*  HCT 23.9* 25.3*  MCV 91.9 92.7   PLT 125* 131*      Lab Results  Component Value Date   HEPBSAG NON REACTIVE 10/08/2019      Microbiology:  Recent Results (from the past 240 hour(s))  Resp Panel by RT-PCR (Flu A&B, Covid) Nasopharyngeal Swab     Status: None   Collection Time: 02/02/20  7:36 PM   Specimen: Nasopharyngeal Swab; Nasopharyngeal(NP) swabs in vial transport medium  Result Value Ref Range Status   SARS Coronavirus 2 by RT PCR NEGATIVE NEGATIVE Final    Comment: (NOTE) SARS-CoV-2 target nucleic acids are NOT DETECTED.  The SARS-CoV-2 RNA is generally detectable in upper respiratory specimens during the acute phase of infection. The lowest concentration of SARS-CoV-2 viral copies this assay can detect is 138 copies/mL. A negative result does not preclude SARS-Cov-2 infection and should not be used as the sole basis for treatment or other patient management decisions. A negative result may occur with  improper specimen collection/handling, submission  of specimen other than nasopharyngeal swab, presence of viral mutation(s) within the areas targeted by this assay, and inadequate number of viral copies(<138 copies/mL). A negative result must be combined with clinical observations, patient history, and epidemiological information. The expected result is Negative.  Fact Sheet for Patients:  EntrepreneurPulse.com.au  Fact Sheet for Healthcare Providers:  IncredibleEmployment.be  This test is no t yet approved or cleared by the [place] FDA and  has been authorized for detection and/or diagnosis of SARS-CoV-2 by FDA under an Emergency Use Authorization (EUA). This EUA will remain  in effect (meaning this test can be used) for the duration of the COVID-19 declaration under Section 564(b)(1) of the Act, 21 U.S.C.section 360bbb-3(b)(1), unless the authorization is terminated  or revoked sooner.       Influenza A by PCR NEGATIVE NEGATIVE Final   Influenza B  by PCR NEGATIVE NEGATIVE Final    Comment: (NOTE) The Xpert Xpress SARS-CoV-2/FLU/RSV plus assay is intended as an aid in the diagnosis of influenza from Nasopharyngeal swab specimens and should not be used as a sole basis for treatment. Nasal washings and aspirates are unacceptable for Xpert Xpress SARS-CoV-2/FLU/RSV testing.  Fact Sheet for Patients: EntrepreneurPulse.com.au  Fact Sheet for Healthcare Providers: IncredibleEmployment.be  This test is not yet approved or cleared by the [place] FDA and has been authorized for detection and/or diagnosis of SARS-CoV-2 by FDA under an Emergency Use Authorization (EUA). This EUA will remain in effect (meaning this test can be used) for the duration of the COVID-19 declaration under Section 564(b)(1) of the Act, 21 U.S.C. section 360bbb-3(b)(1), unless the authorization is terminated or revoked.  Performed at [facility], [address] [place], [place] [address]   MRSA PCR Screening     Status: None   Collection Time: 02/03/20  1:45 PM   Specimen: Nasopharyngeal  Result Value Ref Range Status   MRSA by PCR NEGATIVE NEGATIVE Final    Comment:        The GeneXpert MRSA Assay (FDA approved for NASAL specimens only), is one component of a comprehensive MRSA colonization surveillance program. It is not intended to diagnose MRSA infection nor to guide or monitor treatment for MRSA infections. Performed at [facility], [address] [place], [place] [address]   CULTURE, BLOOD (ROUTINE X 2) w Reflex to ID Panel     Status: None (Preliminary result)   Collection Time: 02/09/20  3:53 AM   Specimen: BLOOD  Result Value Ref Range Status   Specimen Description BLOOD RIGHT HAND  Final   Special Requests   Final    BOTTLES DRAWN AEROBIC AND ANAEROBIC Blood Culture adequate volume   Culture   Final    NO GROWTH 1 DAY Performed at [facility], [address] [place], [place] [address]    Report Status PENDING  Incomplete  CULTURE, BLOOD (ROUTINE X 2) w Reflex to ID Panel     Status: None (Preliminary result)   Collection Time: 02/09/20  3:54 AM   Specimen: BLOOD  Result Value Ref Range Status   Specimen Description BLOOD RIGHT FA  Final   Special Requests   Final    BOTTLES DRAWN AEROBIC AND ANAEROBIC Blood Culture adequate volume   Culture   Final    NO GROWTH 1 DAY Performed at [facility], [address], [place], [place] [address]    Report Status PENDING  Incomplete    Coagulation Studies: No results for input(s): LABPROT, INR  in the last 72 hours.  Urinalysis: No results for input(s): COLORURINE, LABSPEC, PHURINE, GLUCOSEU, HGBUR, BILIRUBINUR, KETONESUR, PROTEINUR, UROBILINOGEN, NITRITE, LEUKOCYTESUR in the last 72 hours.  Invalid input(s): APPERANCEUR    Imaging: CT HEAD WO CONTRAST  Result Date: 02/09/2020 CLINICAL DATA:  64 year old with mental status change. EXAM: CT HEAD WITHOUT CONTRAST TECHNIQUE: Contiguous axial images were obtained from the base of the skull through the vertex without intravenous contrast. COMPARISON:  10/11/2019 FINDINGS: Brain: Low-density in the periventricular white matter is similar to the previous examination. Subtle low-density along the right side of the caudate is similar to the prior CT. Negative for acute hemorrhage, mass lesion, midline shift, hydrocephalus or large infarct. Vascular: No hyperdense vessel or unexpected calcification. Skull: Normal. Negative for fracture or focal lesion. Sinuses/Orbits: Fluid in left mastoid air cells. Other: None IMPRESSION: 1. No acute intracranial abnormality. 2. Periventricular low density is most compatible with chronic small vessel ischemic changes. 3. Small amount of fluid in the left mastoid air cells. Electronically Signed   By: Markus Daft M.D.   On: 02/09/2020 12:05     Medications:    . amiodarone  100 mg Oral Daily  . atorvastatin  40  mg Oral QHS  . Chlorhexidine Gluconate Cloth  6 each Topical Q0600  . epoetin (EPOGEN/PROCRIT) injection  10,000 Units Intravenous Q T,Th,Sa-HD  . feeding supplement (NEPRO CARB STEADY)  237 mL Oral BID BM  . FLUoxetine  20 mg Oral QHS  . folic acid  1 mg Oral Daily  . gabapentin  100 mg Oral QHS  . heparin injection (subcutaneous)  5,000 Units Subcutaneous Q12H  . hydrALAZINE  10 mg Oral BID  . insulin aspart  0-15 Units Subcutaneous TID WC  . insulin aspart  0-5 Units Subcutaneous QHS  . levothyroxine  88 mcg Oral Q0600  . lidocaine-prilocaine   Topical Once  . pantoprazole  40 mg Oral Daily   acetaminophen **OR** acetaminophen, fluticasone, heparin, HYDROmorphone (DILAUDID) injection, melatonin, ondansetron **OR** ondansetron (ZOFRAN) IV, ondansetron (ZOFRAN) IV  Assessment/ Plan:  64 y.o. female with  was admitted on 02/02/2020 for  Principal Problem:   Acute metabolic encephalopathy Active Problems:   Hypothyroid   HTN (hypertension)   Type 2 diabetes mellitus with ESRD (end-stage renal disease) (HCC)   Coronary artery disease   Atrial fibrillation, chronic (HCC)   Depression   Chronic diastolic CHF (congestive heart failure) (HCC)   ESRD (end stage renal disease) (HCC)   Pressure injury of skin   Anemia of chronic renal failure, stage 5 (HCC)   Hypoglycemia   Functional quadriplegia (HCC)  ESRD on dialysis ([place]) [N18.6, Z99.2] AMS (altered mental status) [Q33.35] Acute metabolic encephalopathy [K56.25]  #. ESRD on HD MWF [facility], MWF,Rt.Femoral cath We will plan for dialysis treatment today Will continue MWF schedule  # Complication of Dialysis access Patient underwent fistulogram and stent placement of left AV graft on 02/04/2020, Ongoing issues with cannulation reported by dialysis staff Right femoral tunneled catheter placed by vascular team on 02/05/2020 Dialysis treatments restarted on 02/05/2020  #. Anemia of CKD  Lab Results   Component Value Date   HGB 7.8 (L) 02/10/2020   Continue Epogen with dialysis treatments MWF  #. Secondary hyperparathyroidism of renal origin N 25.81      Component Value Date/Time   PTH 83 (H) 10/09/2019 0847   Lab Results  Component Value Date   PHOS 2.7 02/10/2020    #. Diabetes type 2 with CKD Hemoglobin A1C (  no units)  Date Value  08/16/2017 7.1   Hgb A1c MFr Bld (%)  Date Value  02/03/2020 4.6 (L)  Blood sugar readings within low normal range today Continue IV 5% Dextrose at 30 ml/hr Encourage oral intake   LOS: 7 Jamie Arias 12/8/20214:58 PM  [facility] [place], [place]

## 2020-02-10 NOTE — Progress Notes (Signed)
Progress Note    Jamie Arias  GHW:[id number] DOB: [birth year]  DOA: 02/02/2020 PCP: [name], MD    Brief Narrative:    Medical records reviewed and are as summarized below:  Jamie Arias is a 64 y.o. female with PMH of ESRD on HD MWF, chronic diastolic failure, Afib, CAD, and physical deconditioning who is wheelchair bound who presents to ED with altered mental status after missing 5 dialysis sessions.  Patient is improving slowly with dialysis.   Assessment/Plan:   Principal Problem:   Acute metabolic encephalopathy Active Problems:   Hypothyroid   HTN (hypertension)   Type 2 diabetes mellitus with ESRD (end-stage renal disease) (HCC)   Coronary artery disease   Atrial fibrillation, chronic (HCC)   Depression   Chronic diastolic CHF (congestive heart failure) (HCC)   ESRD (end stage renal disease) (HCC)   Pressure injury of skin   Anemia of chronic renal failure, stage 5 (HCC)   Hypoglycemia   Functional quadriplegia (HCC)  Body mass index is 25.85 kg/m.   Acute Metabolic/uremic encephalopathy due to uremia: - Head CT showed no acute abnormality. - Continue dialysis until mental status improves.   - Glucose runs low in the am.  Patient was started D5 infusion at 50 CC/hr x 4 hours and glucose improved.  This morning slightly hyperglycemic.  Will continue dextrose and monitor.  ESRD on HD MWF Left arm AV graft access is still dysfunctional and it cannot be used for hemodialysis.   12/2 - left arm shuntogram with stent placement left AV graft .   12/3 - right femoral permacath placed.   12/4 - hemodialysis.   - Nephrology following, appreciate assistance and recommendations.  HD per them.  Hypertension:  Blood pressure was normal until this morning when this was slightly hypotensive.  We will place holding parameters to hold the antihypertensive medication which is hydralazine.  Coronary Artery Disease - Not on antiplatelets due to history of  severe GI bleed. - Continue Statin.  Chronic Diastolic Heart Failure - Lasix PRN.  Assess on daily basis.  Currently euvolemic.  Paroxysmal atrial fibrillation: No acute issues.  - Continue Amiodarone for rhythm control. - Not on anticoagulation due to history of severe GI bleed.  This decision was made by patient and her PCP. - Monitor on telemetry.  Anemia of chronic disease: H&H is stable.   - Continue Epogen with dialysis.  Type II DM with recurrent hypoglycemia, poor oral intake: Hemoglobin A1c is 4.6%.  - Monitor glucose levels closely.  Currently on IV dextrose due to hypoglycemia.  Patient is bedbound and wheelchair-bound.   - Husband said she has not been able to walk since he had COVID-19 infection in June 2020.  Stage II sacral decubitus ulcer (present on admission):  - Continue local wound care. Turn patient every 2 hours.   Pressure Injury 02/06/20 Sacrum Left Stage 2 -  Partial thickness loss of dermis presenting as a shallow open injury with a red, pink wound bed without slough. (Active)  02/06/20 1427  Location: Sacrum  Location Orientation: Left  Staging: Stage 2 -  Partial thickness loss of dermis presenting as a shallow open injury with a red, pink wound bed without slough.  Wound Description (Comments):   Present on Admission:      Diet Order            Diet renal with fluid restriction Fluid restriction: 1200 mL Fluid; Room service appropriate? Yes; Fluid consistency:  Thin  Diet effective now                 Consultants:  Nephrologist  Vascular surgeon  Procedures:  Left arm shuntogram with stent placement to AV graft on 02/04/2020  Placement of tunneled hemodialysis catheter via right femoral vein on 02/05/2020   Medications:   . amiodarone  100 mg Oral Daily  . atorvastatin  40 mg Oral QHS  . Chlorhexidine Gluconate Cloth  6 each Topical Q0600  . epoetin (EPOGEN/PROCRIT) injection  10,000 Units Intravenous Q T,Th,Sa-HD  . feeding  supplement (NEPRO CARB STEADY)  237 mL Oral BID BM  . FLUoxetine  20 mg Oral QHS  . folic acid  1 mg Oral Daily  . gabapentin  100 mg Oral QHS  . heparin injection (subcutaneous)  5,000 Units Subcutaneous Q12H  . hydrALAZINE  10 mg Oral BID  . insulin aspart  0-15 Units Subcutaneous TID WC  . insulin aspart  0-5 Units Subcutaneous QHS  . levothyroxine  88 mcg Oral Q0600  . lidocaine-prilocaine   Topical Once  . pantoprazole  40 mg Oral Daily   Continuous Infusions: . dextrose 30 mL/hr at 02/10/20 0302     Anti-infectives (From admission, onward)   Start     Dose/Rate Route Frequency Ordered Stop   02/06/20 0000  ceFAZolin (ANCEF) IVPB 1 g/50 mL premix       Note to Pharmacy: To be given in specials   1 g 100 mL/hr over 30 Minutes Intravenous  Once 02/05/20 1413 02/05/20 1624   02/05/20 1417  ceFAZolin (ANCEF) 1-4 GM/50ML-% IVPB       Note to Pharmacy: Maynor, Erin   : cabinet override      02/05/20 1417 02/05/20 1624   02/04/20 1515  ceFAZolin (ANCEF) IVPB 1 g/50 mL premix  Status:  Discontinued        1 g 100 mL/hr over 30 Minutes Intravenous  Once 02/04/20 1507 02/04/20 1703        Family Communication/Anticipated D/C date and plan/Code Status   DVT prophylaxis:      Code Status: Full Code  Family Communication: None Disposition Plan:   Status is: Inpatient  Remains inpatient appropriate because:Inpatient level of care appropriate due to severity of illness   Dispo: The patient is from: Home              Anticipated d/c is to: Home              Anticipated d/c date is: 1 day              Patient currently is not medically stable to d/c.   Subjective:   Patient seen and examined.  She was quite lethargic this morning.  She was oriented to self only.  She could not tell me current month, year and POTUS.  Objective:    Vitals:   02/10/20 1034 02/10/20 1037 02/10/20 1042 02/10/20 1124  BP:    (!) 99/35  Pulse:    (!) 54  Resp:    16  Temp:    98.2 F  (36.8 C)  TempSrc:    Oral  SpO2: (!) 75% 97% 96% 100%  Weight:      Height:       No data found.   Intake/Output Summary (Last 24 hours) at 02/10/2020 1242 Last data filed at 02/10/2020 0900 Gross per 24 hour  Intake 928.26 ml  Output 0 ml  Net 928.26 ml  Filed Weights   02/04/20 1528 02/05/20 1412 02/06/20 0500  Weight: 74.8 kg 74.8 kg 68.3 kg    Exam:  General exam: Appears calm and comfortable  Respiratory system: Clear to auscultation. Respiratory effort normal. Cardiovascular system: S1 & S2 heard, irregularly irregular rate and rhythm. No JVD, murmurs, rubs, gallops or clicks. No pedal edema. Gastrointestinal system: Abdomen is nondistended, soft and nontender. No organomegaly or masses felt. Normal bowel sounds heard. Central nervous system: Slightly lethargic and oriented x1 only.  No focal neurological deficits.   Data Reviewed:   I have personally reviewed following labs and imaging studies:  Labs: Labs show the following:   Basic Metabolic Panel: Recent Labs  Lab 02/05/20 0629 02/05/20 0629 02/07/20 0818 02/07/20 0818 02/08/20 0926 02/08/20 0926 02/09/20 0342 02/10/20 0346  NA 139  --  136  --  136  --  136 133*  K 3.8   < > 4.0   < > 3.5   < > 3.6 3.3*  CL 98  --  96*  --  97*  --  98 96*  CO2 22  --  30  --  29  --  30 30  GLUCOSE 78  --  79  --  85  --  82 104*  BUN 81*  --  18  --  24*  --  9 12  CREATININE 17.97*  --  5.31*  --  6.32*  --  3.39* 4.37*  CALCIUM 8.3*  --  8.6*  --  8.3*  --  8.4* 8.2*  MG  --   --   --   --   --   --   --  1.9  PHOS 10.0*  --   --   --  3.6  --   --  2.7   < > = values in this interval not displayed.   GFR Estimated Creatinine Clearance: 12 mL/min (A) (by C-G formula based on SCr of 4.37 mg/dL (H)). Liver Function Tests: Recent Labs  Lab 02/05/20 0629  ALBUMIN 2.3*    CBC: Recent Labs  Lab 02/09/20 0342 02/10/20 0346  WBC 4.9 5.6  HGB 7.5* 7.8*  HCT 23.9* 25.3*  MCV 91.9 92.7  PLT 125* 131*    CBG: Recent Labs  Lab 02/09/20 2002 02/10/20 0036 02/10/20 0529 02/10/20 0727 02/10/20 1121  GLUCAP 119* 99 95 107* 185*   Sepsis Labs: Recent Labs  Lab 02/09/20 0342 02/10/20 0346  WBC 4.9 5.6    Microbiology Recent Results (from the past 240 hour(s))  Resp Panel by RT-PCR (Flu A&B, Covid) Nasopharyngeal Swab     Status: None   Collection Time: 02/02/20  7:36 PM   Specimen: Nasopharyngeal Swab; Nasopharyngeal(NP) swabs in vial transport medium  Result Value Ref Range Status   SARS Coronavirus 2 by RT PCR NEGATIVE NEGATIVE Final    Comment: (NOTE) SARS-CoV-2 target nucleic acids are NOT DETECTED.  The SARS-CoV-2 RNA is generally detectable in upper respiratory specimens during the acute phase of infection. The lowest concentration of SARS-CoV-2 viral copies this assay can detect is 138 copies/mL. A negative result does not preclude SARS-Cov-2 infection and should not be used as the sole basis for treatment or other patient management decisions. A negative result may occur with  improper specimen collection/handling, submission of specimen other than nasopharyngeal swab, presence of viral mutation(s) within the areas targeted by this assay, and inadequate number of viral copies(<138 copies/mL). A negative result must be combined with clinical observations,  patient history, and epidemiological information. The expected result is Negative.  Fact Sheet for Patients:  EntrepreneurPulse.com.au  Fact Sheet for Healthcare Providers:  IncredibleEmployment.be  This test is no t yet approved or cleared by the [place] FDA and  has been authorized for detection and/or diagnosis of SARS-CoV-2 by FDA under an Emergency Use Authorization (EUA). This EUA will remain  in effect (meaning this test can be used) for the duration of the COVID-19 declaration under Section 564(b)(1) of the Act, 21 U.S.C.section 360bbb-3(b)(1), unless the  authorization is terminated  or revoked sooner.       Influenza A by PCR NEGATIVE NEGATIVE Final   Influenza B by PCR NEGATIVE NEGATIVE Final    Comment: (NOTE) The Xpert Xpress SARS-CoV-2/FLU/RSV plus assay is intended as an aid in the diagnosis of influenza from Nasopharyngeal swab specimens and should not be used as a sole basis for treatment. Nasal washings and aspirates are unacceptable for Xpert Xpress SARS-CoV-2/FLU/RSV testing.  Fact Sheet for Patients: EntrepreneurPulse.com.au  Fact Sheet for Healthcare Providers: IncredibleEmployment.be  This test is not yet approved or cleared by the [place] FDA and has been authorized for detection and/or diagnosis of SARS-CoV-2 by FDA under an Emergency Use Authorization (EUA). This EUA will remain in effect (meaning this test can be used) for the duration of the COVID-19 declaration under Section 564(b)(1) of the Act, 21 U.S.C. section 360bbb-3(b)(1), unless the authorization is terminated or revoked.  Performed at [facility], [address] [place], [place] [address]   MRSA PCR Screening     Status: None   Collection Time: 02/03/20  1:45 PM   Specimen: Nasopharyngeal  Result Value Ref Range Status   MRSA by PCR NEGATIVE NEGATIVE Final    Comment:        The GeneXpert MRSA Assay (FDA approved for NASAL specimens only), is one component of a comprehensive MRSA colonization surveillance program. It is not intended to diagnose MRSA infection nor to guide or monitor treatment for MRSA infections. Performed at [facility], [address] [place], [place] [address]   CULTURE, BLOOD (ROUTINE X 2) w Reflex to ID Panel     Status: None (Preliminary result)   Collection Time: 02/09/20  3:53 AM   Specimen: BLOOD  Result Value Ref Range Status   Specimen Description BLOOD RIGHT HAND  Final   Special Requests   Final    BOTTLES DRAWN AEROBIC AND ANAEROBIC Blood  Culture adequate volume   Culture   Final    NO GROWTH 1 DAY Performed at [facility], [address], [place], [place] [address]    Report Status PENDING  Incomplete  CULTURE, BLOOD (ROUTINE X 2) w Reflex to ID Panel     Status: None (Preliminary result)   Collection Time: 02/09/20  3:54 AM   Specimen: BLOOD  Result Value Ref Range Status   Specimen Description BLOOD RIGHT FA  Final   Special Requests   Final    BOTTLES DRAWN AEROBIC AND ANAEROBIC Blood Culture adequate volume   Culture   Final    NO GROWTH 1 DAY Performed at [facility], [address], [place], [place] [address]    Report Status PENDING  Incomplete    Procedures and diagnostic studies:  CT HEAD WO CONTRAST  Result Date: 02/09/2020 CLINICAL DATA:  64 year old with mental status change. EXAM: CT HEAD WITHOUT CONTRAST TECHNIQUE: Contiguous axial images were obtained from the base of the skull through the vertex  without intravenous contrast. COMPARISON:  10/11/2019 FINDINGS: Brain: Low-density in the periventricular white matter is similar to the previous examination. Subtle low-density along the right side of the caudate is similar to the prior CT. Negative for acute hemorrhage, mass lesion, midline shift, hydrocephalus or large infarct. Vascular: No hyperdense vessel or unexpected calcification. Skull: Normal. Negative for fracture or focal lesion. Sinuses/Orbits: Fluid in left mastoid air cells. Other: None IMPRESSION: 1. No acute intracranial abnormality. 2. Periventricular low density is most compatible with chronic small vessel ischemic changes. 3. Small amount of fluid in the left mastoid air cells. Electronically Signed   By: Markus Daft M.D.   On: 02/09/2020 12:05      LOS: 7 days   Darliss Cheney , MD [facility]   Pager on [email or web address]. If 7PM-7AM, please contact night-coverage at [email or web address]   02/10/2020, 12:42 PM

## 2020-02-10 NOTE — Progress Notes (Signed)
Treatment completed without any issues or concerns during the 3 hours treated. Patient slept most of the treatment. V/S remained stable. Right Femoral catheter had to be reversed (arterial and venous) to have better pressures. Total UF 500 ml. Patient returned to room 201 and report given to Benay Pike, Therapist, sports.

## 2020-02-10 NOTE — Progress Notes (Signed)
Patient transferred to hemodialysis 

## 2020-02-11 LAB — CBC WITH DIFFERENTIAL/PLATELET
Abs Immature Granulocytes: 0.03 10*3/uL (ref 0.00–0.07)
Basophils Absolute: 0.1 10*3/uL (ref 0.0–0.1)
Basophils Relative: 1 %
Eosinophils Absolute: 0.5 10*3/uL (ref 0.0–0.5)
Eosinophils Relative: 11 %
HCT: 24.3 % — ABNORMAL LOW (ref 36.0–46.0)
Hemoglobin: 7.6 g/dL — ABNORMAL LOW (ref 12.0–15.0)
Immature Granulocytes: 1 %
Lymphocytes Relative: 18 %
Lymphs Abs: 0.9 10*3/uL (ref 0.7–4.0)
MCH: 28.8 pg (ref 26.0–34.0)
MCHC: 31.3 g/dL (ref 30.0–36.0)
MCV: 92 fL (ref 80.0–100.0)
Monocytes Absolute: 0.4 10*3/uL (ref 0.1–1.0)
Monocytes Relative: 9 %
Neutro Abs: 3 10*3/uL (ref 1.7–7.7)
Neutrophils Relative %: 60 %
Platelets: 125 10*3/uL — ABNORMAL LOW (ref 150–400)
RBC: 2.64 MIL/uL — ABNORMAL LOW (ref 3.87–5.11)
RDW: 15.7 % — ABNORMAL HIGH (ref 11.5–15.5)
WBC: 4.8 10*3/uL (ref 4.0–10.5)
nRBC: 0 % (ref 0.0–0.2)

## 2020-02-11 LAB — BASIC METABOLIC PANEL
Anion gap: 8 (ref 5–15)
BUN: <5 mg/dL — ABNORMAL LOW (ref 8–23)
CO2: 30 mmol/L (ref 22–32)
Calcium: 8 mg/dL — ABNORMAL LOW (ref 8.9–10.3)
Chloride: 98 mmol/L (ref 98–111)
Creatinine, Ser: 2.27 mg/dL — ABNORMAL HIGH (ref 0.44–1.00)
GFR, Estimated: 23 mL/min — ABNORMAL LOW (ref >60)
Glucose, Bld: 85 mg/dL (ref 70–99)
Potassium: 3.6 mmol/L (ref 3.5–5.1)
Sodium: 136 mmol/L (ref 135–145)

## 2020-02-11 LAB — GLUCOSE, CAPILLARY
Glucose-Capillary: 87 mg/dL (ref 70–99)
Glucose-Capillary: 88 mg/dL (ref 70–99)
Glucose-Capillary: 94 mg/dL (ref 70–99)
Glucose-Capillary: 77 mg/dL (ref 70–99)
Glucose-Capillary: 63 mg/dL — ABNORMAL LOW (ref 70–99)
Glucose-Capillary: 76 mg/dL (ref 70–99)

## 2020-02-11 MED ORDER — INSULIN ASPART 100 UNIT/ML ~~LOC~~ SOLN: 0.0000 [IU] | Freq: Three times a day (TID) | SUBCUTANEOUS | Status: DC

## 2020-02-11 NOTE — Progress Notes (Addendum)
[facility], [place] 02/11/20  Subjective:   LOS: 8 Patient more awake and communicative today. She received dialysis treatment yesterday.     Objective:  Vital signs in last 24 hours:  Temp:  [98 F (36.7 C)-98.7 F (37.1 C)] 98.7 F (37.1 C) (12/09 0737) Pulse Rate:  [54-63] 62 (12/09 0740) Resp:  [14-22] 22 (12/09 0737) BP: (99-144)/(35-84) 132/41 (12/09 0740) SpO2:  [75 %-100 %] 100 % (12/09 0737)  Weight change:  Filed Weights   02/04/20 1528 02/05/20 1412 02/06/20 0500  Weight: 74.8 kg 74.8 kg 68.3 kg    Intake/Output:    Intake/Output Summary (Last 24 hours) at 02/11/2020 0931 Last data filed at 02/10/2020 2100 Gross per 24 hour  Intake 120 ml  Output 1000 ml  Net -880 ml    Physical Exam: General:  Resting in bed, in no acute distress  HEENT  oral mucous membranes moist  Pulm/lungs  respirations symmetrical, unlabored, lungs clear  CVS/Heart  S1-S2, no rubs or gallops  Abdomen:   Soft, non tender,non distended  Extremities:  No  peripheral edema  Neurologic:  Awake, alert, oriented x2-3  Skin:  No acute lesions or rashes  Left arm AVG Right femoral tunneled catheter by Dr. Lucky Cowboy on 02/05/2020      Basic Metabolic Panel:  Recent Labs  Lab 02/05/20 9242 02/07/20 0818 02/08/20 0926 02/09/20 0342 02/10/20 0346 02/11/20 0346  NA 139 136 136 136 133* 136  K 3.8 4.0 3.5 3.6 3.3* 3.6  CL 98 96* 97* 98 96* 98  CO2 22 30 29 30 30 30   GLUCOSE 78 79 85 82 104* 85  BUN 81* 18 24* 9 12 <5*  CREATININE 17.97* 5.31* 6.32* 3.39* 4.37* 2.27*  CALCIUM 8.3* 8.6* 8.3* 8.4* 8.2* 8.0*  MG  --   --   --   --  1.9  --   PHOS 10.0*  --  3.6  --  2.7  --      CBC: Recent Labs  Lab 02/09/20 0342 02/10/20 0346 02/11/20 0346  WBC 4.9 5.6 4.8  NEUTROABS  --   --  3.0  HGB 7.5* 7.8* 7.6*  HCT 23.9* 25.3* 24.3*  MCV 91.9 92.7 92.0  PLT 125* 131* 125*      Lab Results  Component Value Date   HEPBSAG NON REACTIVE 10/08/2019       Microbiology:  Recent Results (from the past 240 hour(s))  Resp Panel by RT-PCR (Flu A&B, Covid) Nasopharyngeal Swab     Status: None   Collection Time: 02/02/20  7:36 PM   Specimen: Nasopharyngeal Swab; Nasopharyngeal(NP) swabs in vial transport medium  Result Value Ref Range Status   SARS Coronavirus 2 by RT PCR NEGATIVE NEGATIVE Final    Comment: (NOTE) SARS-CoV-2 target nucleic acids are NOT DETECTED.  The SARS-CoV-2 RNA is generally detectable in upper respiratory specimens during the acute phase of infection. The lowest concentration of SARS-CoV-2 viral copies this assay can detect is 138 copies/mL. A negative result does not preclude SARS-Cov-2 infection and should not be used as the sole basis for treatment or other patient management decisions. A negative result may occur with  improper specimen collection/handling, submission of specimen other than nasopharyngeal swab, presence of viral mutation(s) within the areas targeted by this assay, and inadequate number of viral copies(<138 copies/mL). A negative result must be combined with clinical observations, patient history, and epidemiological information. The expected result is Negative.  Fact Sheet for Patients:  EntrepreneurPulse.com.au  Fact Sheet for Healthcare Providers:  IncredibleEmployment.be  This test is no t yet approved or cleared by the [place] FDA and  has been authorized for detection and/or diagnosis of SARS-CoV-2 by FDA under an Emergency Use Authorization (EUA). This EUA will remain  in effect (meaning this test can be used) for the duration of the COVID-19 declaration under Section 564(b)(1) of the Act, 21 U.S.C.section 360bbb-3(b)(1), unless the authorization is terminated  or revoked sooner.       Influenza A by PCR NEGATIVE NEGATIVE Final   Influenza B by PCR NEGATIVE NEGATIVE Final    Comment: (NOTE) The Xpert Xpress SARS-CoV-2/FLU/RSV plus  assay is intended as an aid in the diagnosis of influenza from Nasopharyngeal swab specimens and should not be used as a sole basis for treatment. Nasal washings and aspirates are unacceptable for Xpert Xpress SARS-CoV-2/FLU/RSV testing.  Fact Sheet for Patients: EntrepreneurPulse.com.au  Fact Sheet for Healthcare Providers: IncredibleEmployment.be  This test is not yet approved or cleared by the [place] FDA and has been authorized for detection and/or diagnosis of SARS-CoV-2 by FDA under an Emergency Use Authorization (EUA). This EUA will remain in effect (meaning this test can be used) for the duration of the COVID-19 declaration under Section 564(b)(1) of the Act, 21 U.S.C. section 360bbb-3(b)(1), unless the authorization is terminated or revoked.  Performed at [facility], [address] [place], [place] [address]   MRSA PCR Screening     Status: None   Collection Time: 02/03/20  1:45 PM   Specimen: Nasopharyngeal  Result Value Ref Range Status   MRSA by PCR NEGATIVE NEGATIVE Final    Comment:        The GeneXpert MRSA Assay (FDA approved for NASAL specimens only), is one component of a comprehensive MRSA colonization surveillance program. It is not intended to diagnose MRSA infection nor to guide or monitor treatment for MRSA infections. Performed at [facility], [address] [place], [place] [address]   CULTURE, BLOOD (ROUTINE X 2) w Reflex to ID Panel     Status: None (Preliminary result)   Collection Time: 02/09/20  3:53 AM   Specimen: BLOOD  Result Value Ref Range Status   Specimen Description BLOOD RIGHT HAND  Final   Special Requests   Final    BOTTLES DRAWN AEROBIC AND ANAEROBIC Blood Culture adequate volume   Culture   Final    NO GROWTH 2 DAYS Performed at [facility], [address] [place], [place] [address]    Report Status PENDING  Incomplete  CULTURE, BLOOD (ROUTINE X  2) w Reflex to ID Panel     Status: None (Preliminary result)   Collection Time: 02/09/20  3:54 AM   Specimen: BLOOD  Result Value Ref Range Status   Specimen Description BLOOD RIGHT FA  Final   Special Requests   Final    BOTTLES DRAWN AEROBIC AND ANAEROBIC Blood Culture adequate volume   Culture   Final    NO GROWTH 2 DAYS Performed at [facility], [address], [place], [place] [address]    Report Status PENDING  Incomplete    Coagulation Studies: No results for input(s): LABPROT, INR in the last 72 hours.  Urinalysis: No results for input(s): COLORURINE, LABSPEC, PHURINE, GLUCOSEU, HGBUR, BILIRUBINUR, KETONESUR, PROTEINUR, UROBILINOGEN, NITRITE, LEUKOCYTESUR in the last 72 hours.  Invalid input(s): APPERANCEUR    Imaging: CT HEAD WO CONTRAST  Result Date: 02/09/2020 CLINICAL DATA:  64 year old with mental status change.  EXAM: CT HEAD WITHOUT CONTRAST TECHNIQUE: Contiguous axial images were obtained from the base of the skull through the vertex without intravenous contrast. COMPARISON:  10/11/2019 FINDINGS: Brain: Low-density in the periventricular white matter is similar to the previous examination. Subtle low-density along the right side of the caudate is similar to the prior CT. Negative for acute hemorrhage, mass lesion, midline shift, hydrocephalus or large infarct. Vascular: No hyperdense vessel or unexpected calcification. Skull: Normal. Negative for fracture or focal lesion. Sinuses/Orbits: Fluid in left mastoid air cells. Other: None IMPRESSION: 1. No acute intracranial abnormality. 2. Periventricular low density is most compatible with chronic small vessel ischemic changes. 3. Small amount of fluid in the left mastoid air cells. Electronically Signed   By: Markus Daft M.D.   On: 02/09/2020 12:05     Medications:    . amiodarone  100 mg Oral Daily  . atorvastatin  40 mg Oral QHS  . Chlorhexidine Gluconate Cloth  6 each Topical Q0600  . [START ON 02/12/2020]  epoetin (EPOGEN/PROCRIT) injection  10,000 Units Intravenous Q M,W,F-HD  . feeding supplement (NEPRO CARB STEADY)  237 mL Oral BID BM  . FLUoxetine  20 mg Oral QHS  . folic acid  1 mg Oral Daily  . gabapentin  100 mg Oral QHS  . heparin injection (subcutaneous)  5,000 Units Subcutaneous Q12H  . hydrALAZINE  10 mg Oral BID  . insulin aspart  0-15 Units Subcutaneous TID WC  . insulin aspart  0-5 Units Subcutaneous QHS  . levothyroxine  88 mcg Oral Q0600  . lidocaine-prilocaine   Topical Once  . pantoprazole  40 mg Oral Daily   acetaminophen **OR** acetaminophen, fluticasone, heparin, HYDROmorphone (DILAUDID) injection, melatonin, ondansetron **OR** ondansetron (ZOFRAN) IV, ondansetron (ZOFRAN) IV  Assessment/ Plan:  64 y.o. female with  was admitted on 02/02/2020 for  Principal Problem:   Acute metabolic encephalopathy Active Problems:   Hypothyroid   HTN (hypertension)   Type 2 diabetes mellitus with ESRD (end-stage renal disease) (HCC)   Coronary artery disease   Atrial fibrillation, chronic (HCC)   Depression   Chronic diastolic CHF (congestive heart failure) (HCC)   ESRD (end stage renal disease) (HCC)   Pressure injury of skin   Anemia of chronic renal failure, stage 5 (HCC)   Hypoglycemia   Functional quadriplegia (HCC)  ESRD on dialysis ([place]) [N18.6, Z99.2] AMS (altered mental status) [I62.70] Acute metabolic encephalopathy [J50.09]  #. ESRD on HD MWF [place], MWF,Rt.Femoral cath Patient received dialysis treatment yesterday Volume and electrolyte status acceptable No additional treatment required today  # Complication of Dialysis access Patient underwent fistulogram and stent placement of left AV graft on 02/04/2020, Ongoing issues with cannulation reported by dialysis staff Right femoral tunneled catheter placed by vascular team on 02/05/2020 Dialysis treatments restarted on 02/05/2020  #. Anemia of CKD  Lab Results  Component Value Date    HGB 7.6 (L) 02/11/2020   Will resume Epogen with HD  #. Secondary hyperparathyroidism of renal origin N 25.81      Component Value Date/Time   PTH 83 (H) 10/09/2019 0847   Lab Results  Component Value Date   PHOS 2.7 02/10/2020  We will continue monitoring bone mineral metabolism parameters  #. Diabetes type 2 with CKD Hemoglobin A1C (no units)  Date Value  08/16/2017 7.1   Hgb A1c MFr Bld (%)  Date Value  02/03/2020 4.6 (L)  Blood glucose readings within low normal range We will continue monitoring closely  LOS: [place] 12/9/20219:31 Faith [place], [place]

## 2020-02-11 NOTE — Progress Notes (Signed)
Progress Note    Jamie Arias  WIO:[id number] DOB: December 13, 1955  DOA: 02/02/2020 PCP: [name], MD    Brief Narrative:    Medical records reviewed and are as summarized below:  Jamie Arias is a 64 y.o. female with PMH of ESRD on HD MWF, chronic diastolic failure, Afib, CAD, and physical deconditioning who is wheelchair bound who presents to ED with altered mental status after missing 5 dialysis sessions.  Patient is improving slowly with dialysis.   Assessment/Plan:   Principal Problem:   Acute metabolic encephalopathy Active Problems:   Hypothyroid   HTN (hypertension)   Type 2 diabetes mellitus with ESRD (end-stage renal disease) (HCC)   Coronary artery disease   Atrial fibrillation, chronic (HCC)   Depression   Chronic diastolic CHF (congestive heart failure) (HCC)   ESRD (end stage renal disease) (HCC)   Pressure injury of skin   Anemia of chronic renal failure, stage 5 (HCC)   Hypoglycemia   Functional quadriplegia (HCC)  Body mass index is 25.85 kg/m.   Acute Metabolic/uremic encephalopathy due to uremia: - Head CT showed no acute abnormality. - Continue dialysis until mental status improves.  Patient is much more alert today and oriented x2.  Next dialysis tomorrow per nephrology note.  ESRD on HD MWF Left arm AV graft access is still dysfunctional and it cannot be used for hemodialysis.   12/2 - left arm shuntogram with stent placement left AV graft .   12/3 - right femoral permacath placed.   12/4 - hemodialysis.   - Nephrology following, appreciate assistance and recommendations.  HD per them.  Hypertension:  Blood pressure within normal range.  Continue hydralazine.  Coronary Artery Disease - Not on antiplatelets due to history of severe GI bleed. - Continue Statin.  Chronic Diastolic Heart Failure - Lasix PRN.  Assess on daily basis.  Currently euvolemic.  Paroxysmal atrial fibrillation: No acute issues.  - Continue Amiodarone  for rhythm control. - Not on anticoagulation due to history of severe GI bleed.  This decision was made by patient and her PCP. - Monitor on telemetry.  Anemia of chronic disease: H&H is stable over 7 since last few days.  Transfuse if hemoglobin less than 7. - Continue Epogen with dialysis.  Type II DM with recurrent hypoglycemia, poor oral intake: Hemoglobin A1c is 4.6%.  - Monitor glucose levels closely.  IV dextrose stopped yesterday.  Blood sugar within normal range although on the low side.  Patient is bedbound and wheelchair-bound.   - Husband said she has not been able to walk since he had COVID-19 infection in June 2020.  Stage II sacral decubitus ulcer (present on admission):  - Continue local wound care. Turn patient every 2 hours.   Pressure Injury 02/06/20 Sacrum Left Stage 2 -  Partial thickness loss of dermis presenting as a shallow open injury with a red, pink wound bed without slough. (Active)  02/06/20 1427  Location: Sacrum  Location Orientation: Left  Staging: Stage 2 -  Partial thickness loss of dermis presenting as a shallow open injury with a red, pink wound bed without slough.  Wound Description (Comments):   Present on Admission:      Diet Order            Diet renal with fluid restriction Fluid restriction: 1200 mL Fluid; Room service appropriate? Yes; Fluid consistency: Thin  Diet effective now  Consultants:  Nephrologist  Vascular surgeon  Procedures:  Left arm shuntogram with stent placement to AV graft on 02/04/2020  Placement of tunneled hemodialysis catheter via right femoral vein on 02/05/2020   Medications:   . amiodarone  100 mg Oral Daily  . atorvastatin  40 mg Oral QHS  . Chlorhexidine Gluconate Cloth  6 each Topical Q0600  . [START ON 02/12/2020] epoetin (EPOGEN/PROCRIT) injection  10,000 Units Intravenous Q M,W,F-HD  . feeding supplement (NEPRO CARB STEADY)  237 mL Oral BID BM  . FLUoxetine  20 mg Oral QHS  .  folic acid  1 mg Oral Daily  . gabapentin  100 mg Oral QHS  . heparin injection (subcutaneous)  5,000 Units Subcutaneous Q12H  . hydrALAZINE  10 mg Oral BID  . insulin aspart  0-15 Units Subcutaneous TID WC  . insulin aspart  0-5 Units Subcutaneous QHS  . levothyroxine  88 mcg Oral Q0600  . lidocaine-prilocaine   Topical Once  . pantoprazole  40 mg Oral Daily   Continuous Infusions:    Anti-infectives (From admission, onward)   Start     Dose/Rate Route Frequency Ordered Stop   02/06/20 0000  ceFAZolin (ANCEF) IVPB 1 g/50 mL premix       Note to Pharmacy: To be given in specials   1 g 100 mL/hr over 30 Minutes Intravenous  Once 02/05/20 1413 02/05/20 1624   02/05/20 1417  ceFAZolin (ANCEF) 1-4 GM/50ML-% IVPB       Note to Pharmacy: Maynor, Erin   : cabinet override      02/05/20 1417 02/05/20 1624   02/04/20 1515  ceFAZolin (ANCEF) IVPB 1 g/50 mL premix  Status:  Discontinued        1 g 100 mL/hr over 30 Minutes Intravenous  Once 02/04/20 1507 02/04/20 1703        Family Communication/Anticipated D/C date and plan/Code Status   DVT prophylaxis:      Code Status: Full Code  Family Communication: Updated patient's husband over the phone. Disposition Plan:   Status is: Inpatient  Remains inpatient appropriate because:Inpatient level of care appropriate due to severity of illness   Dispo: The patient is from: Home              Anticipated d/c is to: Home              Anticipated d/c date is: 1 day              Patient currently is not medically stable to d/c.   Subjective:   Patient seen and examined.  She was much more alert compared to yesterday.  Oriented x2.  She denied having any complaint.  Objective:    Vitals:   02/11/20 0411 02/11/20 0737 02/11/20 0740 02/11/20 1125  BP: (!) 142/44 (!) 133/37 (!) 132/41 (!) 140/43  Pulse: 63 62 62 64  Resp: 20 (!) 22  20  Temp: 98 F (36.7 C) 98.7 F (37.1 C)  98 F (36.7 C)  TempSrc: Oral Axillary  Axillary   SpO2: 100% 100%  100%  Weight:      Height:       No data found.   Intake/Output Summary (Last 24 hours) at 02/11/2020 1239 Last data filed at 02/11/2020 1000 Gross per 24 hour  Intake 80 ml  Output 1000 ml  Net -920 ml   Filed Weights   02/04/20 1528 02/05/20 1412 02/06/20 0500  Weight: 74.8 kg 74.8 kg 68.3 kg  Exam:  General exam: Appears calm and comfortable  Respiratory system: Clear to auscultation. Respiratory effort normal. Cardiovascular system: S1 & S2 heard, RRR. No JVD, murmurs, rubs, gallops or clicks. No pedal edema. Gastrointestinal system: Abdomen is nondistended, soft and nontender. No organomegaly or masses felt. Normal bowel sounds heard. Central nervous system: Alert and oriented x2. No focal neurological deficits. Extremities: Symmetric 5 x 5 power. Skin: No rashes, lesions or ulcers.    Data Reviewed:   I have personally reviewed following labs and imaging studies:  Labs: Labs show the following:   Basic Metabolic Panel: Recent Labs  Lab 02/05/20 0629 02/07/20 0818 02/08/20 0926 02/09/20 0342 02/10/20 0346 02/11/20 0346  NA 139 136 136 136 133* 136  K 3.8 4.0 3.5 3.6 3.3* 3.6  CL 98 96* 97* 98 96* 98  CO2 22 30 29 30 30 30   GLUCOSE 78 79 85 82 104* 85  BUN 81* 18 24* 9 12 <5*  CREATININE 17.97* 5.31* 6.32* 3.39* 4.37* 2.27*  CALCIUM 8.3* 8.6* 8.3* 8.4* 8.2* 8.0*  MG  --   --   --   --  1.9  --   PHOS 10.0*  --  3.6  --  2.7  --    GFR Estimated Creatinine Clearance: 23.1 mL/min (A) (by C-G formula based on SCr of 2.27 mg/dL (H)). Liver Function Tests: Recent Labs  Lab 02/05/20 0629  ALBUMIN 2.3*    CBC: Recent Labs  Lab 02/09/20 0342 02/10/20 0346 02/11/20 0346  WBC 4.9 5.6 4.8  NEUTROABS  --   --  3.0  HGB 7.5* 7.8* 7.6*  HCT 23.9* 25.3* 24.3*  MCV 91.9 92.7 92.0  PLT 125* 131* 125*   CBG: Recent Labs  Lab 02/10/20 1946 02/10/20 2338 02/11/20 0407 02/11/20 0748 02/11/20 1129  GLUCAP 79 80 87 88 94    Sepsis Labs: Recent Labs  Lab 02/09/20 0342 02/10/20 0346 02/11/20 0346  WBC 4.9 5.6 4.8    Microbiology Recent Results (from the past 240 hour(s))  Resp Panel by RT-PCR (Flu A&B, Covid) Nasopharyngeal Swab     Status: None   Collection Time: 02/02/20  7:36 PM   Specimen: Nasopharyngeal Swab; Nasopharyngeal(NP) swabs in vial transport medium  Result Value Ref Range Status   SARS Coronavirus 2 by RT PCR NEGATIVE NEGATIVE Final    Comment: (NOTE) SARS-CoV-2 target nucleic acids are NOT DETECTED.  The SARS-CoV-2 RNA is generally detectable in upper respiratory specimens during the acute phase of infection. The lowest concentration of SARS-CoV-2 viral copies this assay can detect is 138 copies/mL. A negative result does not preclude SARS-Cov-2 infection and should not be used as the sole basis for treatment or other patient management decisions. A negative result may occur with  improper specimen collection/handling, submission of specimen other than nasopharyngeal swab, presence of viral mutation(s) within the areas targeted by this assay, and inadequate number of viral copies(<138 copies/mL). A negative result must be combined with clinical observations, patient history, and epidemiological information. The expected result is Negative.  Fact Sheet for Patients:  EntrepreneurPulse.com.au  Fact Sheet for Healthcare Providers:  IncredibleEmployment.be  This test is no t yet approved or cleared by the [place] FDA and  has been authorized for detection and/or diagnosis of SARS-CoV-2 by FDA under an Emergency Use Authorization (EUA). This EUA will remain  in effect (meaning this test can be used) for the duration of the COVID-19 declaration under Section 564(b)(1) of the Act, 21 U.S.C.section 360bbb-3(b)(1), unless the  authorization is terminated  or revoked sooner.       Influenza A by PCR NEGATIVE NEGATIVE Final   Influenza B by  PCR NEGATIVE NEGATIVE Final    Comment: (NOTE) The Xpert Xpress SARS-CoV-2/FLU/RSV plus assay is intended as an aid in the diagnosis of influenza from Nasopharyngeal swab specimens and should not be used as a sole basis for treatment. Nasal washings and aspirates are unacceptable for Xpert Xpress SARS-CoV-2/FLU/RSV testing.  Fact Sheet for Patients: EntrepreneurPulse.com.au  Fact Sheet for Healthcare Providers: IncredibleEmployment.be  This test is not yet approved or cleared by the [place] FDA and has been authorized for detection and/or diagnosis of SARS-CoV-2 by FDA under an Emergency Use Authorization (EUA). This EUA will remain in effect (meaning this test can be used) for the duration of the COVID-19 declaration under Section 564(b)(1) of the Act, 21 U.S.C. section 360bbb-3(b)(1), unless the authorization is terminated or revoked.  Performed at [facility], [address] [place], [place] [address]   MRSA PCR Screening     Status: None   Collection Time: 02/03/20  1:45 PM   Specimen: Nasopharyngeal  Result Value Ref Range Status   MRSA by PCR NEGATIVE NEGATIVE Final    Comment:        The GeneXpert MRSA Assay (FDA approved for NASAL specimens only), is one component of a comprehensive MRSA colonization surveillance program. It is not intended to diagnose MRSA infection nor to guide or monitor treatment for MRSA infections. Performed at [facility], [address] [place], [place] [address]   CULTURE, BLOOD (ROUTINE X 2) w Reflex to ID Panel     Status: None (Preliminary result)   Collection Time: 02/09/20  3:53 AM   Specimen: BLOOD  Result Value Ref Range Status   Specimen Description BLOOD RIGHT HAND  Final   Special Requests   Final    BOTTLES DRAWN AEROBIC AND ANAEROBIC Blood Culture adequate volume   Culture   Final    NO GROWTH 2 DAYS Performed at [facility], [address] [place], [place] [address]    Report Status PENDING  Incomplete  CULTURE, BLOOD (ROUTINE X 2) w Reflex to ID Panel     Status: None (Preliminary result)   Collection Time: 02/09/20  3:54 AM   Specimen: BLOOD  Result Value Ref Range Status   Specimen Description BLOOD RIGHT FA  Final   Special Requests   Final    BOTTLES DRAWN AEROBIC AND ANAEROBIC Blood Culture adequate volume   Culture   Final    NO GROWTH 2 DAYS Performed at [facility], [address], [place], [place] [address]    Report Status PENDING  Incomplete    Procedures and diagnostic studies:  No results found.    LOS: 8 days   Darliss Cheney , MD [facility]   Pager on [email or web address]. If 7PM-7AM, please contact night-coverage at [email or web address]   02/11/2020, 12:39 PM

## 2020-02-11 NOTE — Plan of Care (Signed)
Patient eating bites of food, encouraged protein shake.

## 2020-02-11 NOTE — Care Management Important Message (Signed)
Important Message  Patient Details  Name: Jamie Arias MRN: [id number] Date of Birth: 08/14/1955   Medicare Important Message Given:  Yes     Dannette Barbara 02/11/2020, 1:13 PM

## 2020-02-12 LAB — BASIC METABOLIC PANEL
Anion gap: 8 (ref 5–15)
BUN: 9 mg/dL (ref 8–23)
CO2: 30 mmol/L (ref 22–32)
Calcium: 7.9 mg/dL — ABNORMAL LOW (ref 8.9–10.3)
Chloride: 97 mmol/L — ABNORMAL LOW (ref 98–111)
Creatinine, Ser: 3.4 mg/dL — ABNORMAL HIGH (ref 0.44–1.00)
GFR, Estimated: 14 mL/min — ABNORMAL LOW (ref >60)
Glucose, Bld: 80 mg/dL (ref 70–99)
Potassium: 3.6 mmol/L (ref 3.5–5.1)
Sodium: 135 mmol/L (ref 135–145)

## 2020-02-12 LAB — GLUCOSE, CAPILLARY
Glucose-Capillary: 117 mg/dL — ABNORMAL HIGH (ref 70–99)
Glucose-Capillary: 65 mg/dL — ABNORMAL LOW (ref 70–99)
Glucose-Capillary: 66 mg/dL — ABNORMAL LOW (ref 70–99)
Glucose-Capillary: 67 mg/dL — ABNORMAL LOW (ref 70–99)
Glucose-Capillary: 71 mg/dL (ref 70–99)
Glucose-Capillary: 73 mg/dL (ref 70–99)
Glucose-Capillary: 75 mg/dL (ref 70–99)
Glucose-Capillary: 75 mg/dL (ref 70–99)
Glucose-Capillary: 76 mg/dL (ref 70–99)

## 2020-02-12 LAB — CBC WITH DIFFERENTIAL/PLATELET
Abs Immature Granulocytes: 0.05 10*3/uL (ref 0.00–0.07)
Basophils Absolute: 0.1 10*3/uL (ref 0.0–0.1)
Basophils Relative: 2 %
Eosinophils Absolute: 0.6 10*3/uL — ABNORMAL HIGH (ref 0.0–0.5)
Eosinophils Relative: 10 %
HCT: 24.1 % — ABNORMAL LOW (ref 36.0–46.0)
Hemoglobin: 7.5 g/dL — ABNORMAL LOW (ref 12.0–15.0)
Immature Granulocytes: 1 %
Lymphocytes Relative: 21 %
Lymphs Abs: 1.2 10*3/uL (ref 0.7–4.0)
MCH: 29 pg (ref 26.0–34.0)
MCHC: 31.1 g/dL (ref 30.0–36.0)
MCV: 93.1 fL (ref 80.0–100.0)
Monocytes Absolute: 0.5 10*3/uL (ref 0.1–1.0)
Monocytes Relative: 9 %
Neutro Abs: 3.4 10*3/uL (ref 1.7–7.7)
Neutrophils Relative %: 57 %
Platelets: 161 10*3/uL (ref 150–400)
RBC: 2.59 MIL/uL — ABNORMAL LOW (ref 3.87–5.11)
RDW: 15.9 % — ABNORMAL HIGH (ref 11.5–15.5)
WBC: 5.9 10*3/uL (ref 4.0–10.5)
nRBC: 0 % (ref 0.0–0.2)

## 2020-02-12 LAB — PHOSPHORUS: Phosphorus: 2.5 mg/dL (ref 2.5–4.6)

## 2020-02-12 MED ORDER — PROMETHAZINE HCL 25 MG/ML IJ SOLN
12.5000 mg | Freq: Once | INTRAMUSCULAR | Status: AC
  Administered 2020-02-12: 12.5 mg via INTRAVENOUS
  Filled 2020-02-12: qty 1

## 2020-02-12 MED ORDER — HYDRALAZINE HCL 25 MG PO TABS
25.0000 mg | ORAL_TABLET | Freq: Three times a day (TID) | ORAL | Status: DC
  Administered 2020-02-12: 25 mg via ORAL
  Filled 2020-02-12 (×2): qty 1

## 2020-02-12 NOTE — Plan of Care (Signed)
Continuing with plan of care. 

## 2020-02-12 NOTE — Progress Notes (Signed)
[facility], [place] 02/12/20  Subjective:   LOS: 9 Patient seen in dialysis today, appears alert and oriented.   HEMODIALYSIS FLOWSHEET:  Blood Flow Rate (mL/min): 300 mL/min Arterial Pressure (mmHg): -130 mmHg Venous Pressure (mmHg): 100 mmHg Transmembrane Pressure (mmHg): 50 mmHg Ultrafiltration Rate (mL/min): 430 mL/min Dialysate Flow Rate (mL/min): 600 ml/min Conductivity: Machine : 14 Conductivity: Machine : 14 Dialysis Fluid Bolus: Normal Saline Bolus Amount (mL): 250 mL     Objective:  Vital signs in last 24 hours:  Temp:  [97.9 F (36.6 C)-98.5 F (36.9 C)] 98.1 F (36.7 C) (12/10 1022) Pulse Rate:  [60-72] 71 (12/10 1200) Resp:  [7-20] 13 (12/10 1200) BP: (92-155)/(34-94) 113/94 (12/10 1200) SpO2:  [95 %-100 %] 100 % (12/10 1022)  Weight change:  Filed Weights   02/04/20 1528 02/05/20 1412 02/06/20 0500  Weight: 74.8 kg 74.8 kg 68.3 kg    Intake/Output:    Intake/Output Summary (Last 24 hours) at 02/12/2020 1233 Last data filed at 02/12/2020 0216 Gross per 24 hour  Intake 0 ml  Output --  Net 0 ml    Physical Exam: General:  In no acute distress, receiving dialysis treatment  HEENT  normocephalic, atraumatic  Pulm/lungs  lungs clear to auscultation bilaterally, normal respiratory effort  CVS/Heart  regular rate and rhythm  Abdomen:   Soft, non tender,non distended  Extremities:  No  peripheral edema  Neurologic:  Able to answer simple questions appropriately, speech clear  Skin:  No acute lesions or rashes  Left arm AVG Right femoral tunneled catheter by Dr. Lucky Cowboy on 02/05/2020      Basic Metabolic Panel:  Recent Labs  Lab 02/08/20 0926 02/09/20 0342 02/10/20 0346 02/11/20 0346 02/12/20 0456  NA 136 136 133* 136 135  K 3.5 3.6 3.3* 3.6 3.6  CL 97* 98 96* 98 97*  CO2 29 30 30 30 30   GLUCOSE 85 82 104* 85 80  BUN 24* 9 12 <5* 9  CREATININE 6.32* 3.39* 4.37* 2.27* 3.40*  CALCIUM 8.3* 8.4* 8.2* 8.0* 7.9*   MG  --   --  1.9  --   --   PHOS 3.6  --  2.7  --   --      CBC: Recent Labs  Lab 02/09/20 0342 02/10/20 0346 02/11/20 0346 02/12/20 0456  WBC 4.9 5.6 4.8 5.9  NEUTROABS  --   --  3.0 3.4  HGB 7.5* 7.8* 7.6* 7.5*  HCT 23.9* 25.3* 24.3* 24.1*  MCV 91.9 92.7 92.0 93.1  PLT 125* 131* 125* 161      Lab Results  Component Value Date   HEPBSAG NON REACTIVE 10/08/2019      Microbiology:  Recent Results (from the past 240 hour(s))  Resp Panel by RT-PCR (Flu A&B, Covid) Nasopharyngeal Swab     Status: None   Collection Time: 02/02/20  7:36 PM   Specimen: Nasopharyngeal Swab; Nasopharyngeal(NP) swabs in vial transport medium  Result Value Ref Range Status   SARS Coronavirus 2 by RT PCR NEGATIVE NEGATIVE Final    Comment: (NOTE) SARS-CoV-2 target nucleic acids are NOT DETECTED.  The SARS-CoV-2 RNA is generally detectable in upper respiratory specimens during the acute phase of infection. The lowest concentration of SARS-CoV-2 viral copies this assay can detect is 138 copies/mL. A negative result does not preclude SARS-Cov-2 infection and should not be used as the sole basis for treatment or other patient management decisions. A negative result may occur with  improper specimen collection/handling, submission  of specimen other than nasopharyngeal swab, presence of viral mutation(s) within the areas targeted by this assay, and inadequate number of viral copies(<138 copies/mL). A negative result must be combined with clinical observations, patient history, and epidemiological information. The expected result is Negative.  Fact Sheet for Patients:  EntrepreneurPulse.com.au  Fact Sheet for Healthcare Providers:  IncredibleEmployment.be  This test is no t yet approved or cleared by the [place] FDA and  has been authorized for detection and/or diagnosis of SARS-CoV-2 by FDA under an Emergency Use Authorization (EUA). This EUA will  remain  in effect (meaning this test can be used) for the duration of the COVID-19 declaration under Section 564(b)(1) of the Act, 21 U.S.C.section 360bbb-3(b)(1), unless the authorization is terminated  or revoked sooner.       Influenza A by PCR NEGATIVE NEGATIVE Final   Influenza B by PCR NEGATIVE NEGATIVE Final    Comment: (NOTE) The Xpert Xpress SARS-CoV-2/FLU/RSV plus assay is intended as an aid in the diagnosis of influenza from Nasopharyngeal swab specimens and should not be used as a sole basis for treatment. Nasal washings and aspirates are unacceptable for Xpert Xpress SARS-CoV-2/FLU/RSV testing.  Fact Sheet for Patients: EntrepreneurPulse.com.au  Fact Sheet for Healthcare Providers: IncredibleEmployment.be  This test is not yet approved or cleared by the [place] FDA and has been authorized for detection and/or diagnosis of SARS-CoV-2 by FDA under an Emergency Use Authorization (EUA). This EUA will remain in effect (meaning this test can be used) for the duration of the COVID-19 declaration under Section 564(b)(1) of the Act, 21 U.S.C. section 360bbb-3(b)(1), unless the authorization is terminated or revoked.  Performed at [facility], [address] [place], [place] [address]   MRSA PCR Screening     Status: None   Collection Time: 02/03/20  1:45 PM   Specimen: Nasopharyngeal  Result Value Ref Range Status   MRSA by PCR NEGATIVE NEGATIVE Final    Comment:        The GeneXpert MRSA Assay (FDA approved for NASAL specimens only), is one component of a comprehensive MRSA colonization surveillance program. It is not intended to diagnose MRSA infection nor to guide or monitor treatment for MRSA infections. Performed at [facility], [address] [place], [place] [address]   CULTURE, BLOOD (ROUTINE X 2) w Reflex to ID Panel     Status: None (Preliminary result)   Collection Time: 02/09/20  3:53  AM   Specimen: BLOOD  Result Value Ref Range Status   Specimen Description BLOOD RIGHT HAND  Final   Special Requests   Final    BOTTLES DRAWN AEROBIC AND ANAEROBIC Blood Culture adequate volume   Culture   Final    NO GROWTH 3 DAYS Performed at [facility], [address] [place], [place] [address]    Report Status PENDING  Incomplete  CULTURE, BLOOD (ROUTINE X 2) w Reflex to ID Panel     Status: None (Preliminary result)   Collection Time: 02/09/20  3:54 AM   Specimen: BLOOD  Result Value Ref Range Status   Specimen Description BLOOD RIGHT FA  Final   Special Requests   Final    BOTTLES DRAWN AEROBIC AND ANAEROBIC Blood Culture adequate volume   Culture   Final    NO GROWTH 3 DAYS Performed at [facility], [address], [place],  [address]    Report Status PENDING  Incomplete    Coagulation Studies: No results for input(s): LABPROT, INR  in the last 72 hours.  Urinalysis: No results for input(s): COLORURINE, LABSPEC, PHURINE, GLUCOSEU, HGBUR, BILIRUBINUR, KETONESUR, PROTEINUR, UROBILINOGEN, NITRITE, LEUKOCYTESUR in the last 72 hours.  Invalid input(s): APPERANCEUR    Imaging: No results found.   Medications:    . amiodarone  100 mg Oral Daily  . atorvastatin  40 mg Oral QHS  . Chlorhexidine Gluconate Cloth  6 each Topical Q0600  . epoetin (EPOGEN/PROCRIT) injection  10,000 Units Intravenous Q M,W,F-HD  . feeding supplement (NEPRO CARB STEADY)  237 mL Oral BID BM  . FLUoxetine  20 mg Oral QHS  . folic acid  1 mg Oral Daily  . gabapentin  100 mg Oral QHS  . heparin injection (subcutaneous)  5,000 Units Subcutaneous Q12H  . hydrALAZINE  10 mg Oral BID  . insulin aspart  0-5 Units Subcutaneous QHS  . insulin aspart  0-6 Units Subcutaneous TID WC  . levothyroxine  88 mcg Oral Q0600  . lidocaine-prilocaine   Topical Once  . pantoprazole  40 mg Oral Daily   acetaminophen **OR** acetaminophen, fluticasone, heparin, HYDROmorphone  (DILAUDID) injection, melatonin, ondansetron **OR** ondansetron (ZOFRAN) IV, ondansetron (ZOFRAN) IV  Assessment/ Plan:  64 y.o. female with  was admitted on 02/02/2020 for  Principal Problem:   Acute metabolic encephalopathy Active Problems:   Hypothyroid   HTN (hypertension)   Type 2 diabetes mellitus with ESRD (end-stage renal disease) (HCC)   Coronary artery disease   Atrial fibrillation, chronic (HCC)   Depression   Chronic diastolic CHF (congestive heart failure) (HCC)   ESRD (end stage renal disease) (HCC)   Pressure injury of skin   Anemia of chronic renal failure, stage 5 (HCC)   Hypoglycemia   Functional quadriplegia (HCC)  ESRD on dialysis ([place]) [N18.6, Z99.2] AMS (altered mental status) [V49.44] Acute metabolic encephalopathy [H67.59]  #. ESRD on HD MWF [place], MWF,Rt.Femoral cath Patient receiving dialysis treatment today We will continue Monday Wednesday Friday schedule  # Complication of Dialysis access Patient underwent fistulogram and stent placement of left AV graft on 02/04/2020, Ongoing issues with cannulation reported by dialysis staff Right femoral tunneled catheter placed by vascular team on 02/05/2020 Dialysis treatments restarted on 02/05/2020  #. Anemia of CKD  Lab Results  Component Value Date   HGB 7.5 (L) 02/12/2020   Hemoglobin level stays low but stable We will continue Epogen with dialysis treatments  #. Secondary hyperparathyroidism of renal origin N 25.81      Component Value Date/Time   PTH 83 (H) 10/09/2019 0847   Lab Results  Component Value Date   PHOS 2.7 02/10/2020  No acute indication for phos binders  #. Diabetes type 2 with CKD Hemoglobin A1C (no units)  Date Value  08/16/2017 7.1   Hgb A1c MFr Bld (%)  Date Value  02/03/2020 4.6 (L)  Multiple hypoglycemic episodes during this admission Encourage p.o. intake and continue monitoring  blood glucose readings    LOS: [place] 12/10/202112:33 PM  [facility] [place], [place]

## 2020-02-12 NOTE — Progress Notes (Signed)
Progress Note    Jamie Arias  IRJ:[id number] DOB: December 13, 1955  DOA: 02/02/2020 PCP: [name], MD    Brief Narrative:    Medical records reviewed and are as summarized below:  TALINA PLEITEZ is a 64 y.o. female with PMH of ESRD on HD MWF, chronic diastolic failure, Afib, CAD, and physical deconditioning who is wheelchair bound who presents to ED with altered mental status after missing 5 dialysis sessions.  Patient is improving slowly with dialysis.   Assessment/Plan:   Principal Problem:   Acute metabolic encephalopathy Active Problems:   Hypothyroid   HTN (hypertension)   Type 2 diabetes mellitus with ESRD (end-stage renal disease) (HCC)   Coronary artery disease   Atrial fibrillation, chronic (HCC)   Depression   Chronic diastolic CHF (congestive heart failure) (HCC)   ESRD (end stage renal disease) (HCC)   Pressure injury of skin   Anemia of chronic renal failure, stage 5 (HCC)   Hypoglycemia   Functional quadriplegia (HCC)  Body mass index is 25.85 kg/m.   Acute Metabolic/uremic encephalopathy due to uremia: - Head CT showed no acute abnormality. - Continue dialysis until mental status improves.  Patient continues to improve.  She is alert and oriented x3 today.  Her baseline mental status is unknown to me.  I called patient's husband and requested to come see the patient and verify so we can plan on discharging her tomorrow or as soon as possible  ESRD on HD MWF Left arm AV graft access is still dysfunctional and it cannot be used for hemodialysis.   12/2 - left arm shuntogram with stent placement left AV graft .   12/3 - right femoral permacath placed.   12/4 - hemodialysis.   - Nephrology following, appreciate assistance and recommendations.  HD per them.  Hypertension: Blood pressure slightly elevated.  She is on very low-dose of hydralazine.  Will increase hydralazine to 25 mg 3 times daily.  Coronary Artery Disease - Not on antiplatelets  due to history of severe GI bleed. - Continue Statin.  Chronic Diastolic Heart Failure - Lasix PRN.  Assess on daily basis.  Currently euvolemic.  Paroxysmal atrial fibrillation: No acute issues.  - Continue Amiodarone for rhythm control. - Not on anticoagulation due to history of severe GI bleed.  This decision was made by patient and her PCP. - Monitor on telemetry.  Anemia of chronic disease: H&H is stable over 7 since last few days.  Transfuse if hemoglobin less than 7. - Continue Epogen with dialysis.  Type II DM with recurrent hypoglycemia, poor oral intake: Hemoglobin A1c is 4.6%.  - Monitor glucose levels closely.  IV dextrose stopped day before yesterday.  She was fine up until this morning when she became hypoglycemic again.  This is likely due to poor p.o. intake.  Monitor for now.  Patient is bedbound and wheelchair-bound.   - Husband said she has not been able to walk since he had COVID-19 infection in June 2020.  Stage II sacral decubitus ulcer (present on admission):  - Continue local wound care. Turn patient every 2 hours.  Goal of care: We will consult palliative care.   Pressure Injury 02/06/20 Sacrum Left Stage 2 -  Partial thickness loss of dermis presenting as a shallow open injury with a red, pink wound bed without slough. (Active)  02/06/20 1427  Location: Sacrum  Location Orientation: Left  Staging: Stage 2 -  Partial thickness loss of dermis presenting as a  shallow open injury with a red, pink wound bed without slough.  Wound Description (Comments):   Present on Admission:      Diet Order            Diet renal with fluid restriction Fluid restriction: 1200 mL Fluid; Room service appropriate? Yes; Fluid consistency: Thin  Diet effective now                 Consultants:  Nephrologist  Vascular surgeon  Procedures:  Left arm shuntogram with stent placement to AV graft on 02/04/2020  Placement of tunneled hemodialysis catheter via right  femoral vein on 02/05/2020   Medications:   . amiodarone  100 mg Oral Daily  . atorvastatin  40 mg Oral QHS  . Chlorhexidine Gluconate Cloth  6 each Topical Q0600  . epoetin (EPOGEN/PROCRIT) injection  10,000 Units Intravenous Q M,W,F-HD  . feeding supplement (NEPRO CARB STEADY)  237 mL Oral BID BM  . FLUoxetine  20 mg Oral QHS  . folic acid  1 mg Oral Daily  . gabapentin  100 mg Oral QHS  . heparin injection (subcutaneous)  5,000 Units Subcutaneous Q12H  . hydrALAZINE  10 mg Oral BID  . insulin aspart  0-5 Units Subcutaneous QHS  . insulin aspart  0-6 Units Subcutaneous TID WC  . levothyroxine  88 mcg Oral Q0600  . lidocaine-prilocaine   Topical Once  . pantoprazole  40 mg Oral Daily   Continuous Infusions:    Anti-infectives (From admission, onward)   Start     Dose/Rate Route Frequency Ordered Stop   02/06/20 0000  ceFAZolin (ANCEF) IVPB 1 g/50 mL premix       Note to Pharmacy: To be given in specials   1 g 100 mL/hr over 30 Minutes Intravenous  Once 02/05/20 1413 02/05/20 1624   02/05/20 1417  ceFAZolin (ANCEF) 1-4 GM/50ML-% IVPB       Note to Pharmacy: Maynor, Erin   : cabinet override      02/05/20 1417 02/05/20 1624   02/04/20 1515  ceFAZolin (ANCEF) IVPB 1 g/50 mL premix  Status:  Discontinued        1 g 100 mL/hr over 30 Minutes Intravenous  Once 02/04/20 1507 02/04/20 1703        Family Communication/Anticipated D/C date and plan/Code Status   DVT prophylaxis:      Code Status: Full Code  Family Communication: Updated patient's husband over the phone. Disposition Plan:   Status is: Inpatient  Remains inpatient appropriate because:Inpatient level of care appropriate due to severity of illness   Dispo: The patient is from: Home              Anticipated d/c is to: Home              Anticipated d/c date is: 1 day              Patient currently is not medically stable to d/c.   Subjective:   Patient seen and examined.  Patient alert like yesterday  but slightly more oriented today.  Denied any complaint.  Objective:    Vitals:   02/12/20 1215 02/12/20 1230 02/12/20 1245 02/12/20 1300  BP: (!) 160/63 (!) 157/66 (!) 150/85 (!) 160/138  Pulse: 70 70 71 70  Resp: (!) 9 18 14 11   Temp:      TempSrc:      SpO2:      Weight:      Height:  No data found.   Intake/Output Summary (Last 24 hours) at 02/12/2020 1332 Last data filed at 02/12/2020 0216 Gross per 24 hour  Intake 0 ml  Output --  Net 0 ml   Filed Weights   02/04/20 1528 02/05/20 1412 02/06/20 0500  Weight: 74.8 kg 74.8 kg 68.3 kg    Exam:  General exam: Appears calm and comfortable  Respiratory system: Clear to auscultation. Respiratory effort normal. Cardiovascular system: S1 & S2 heard, RRR. No JVD, murmurs, rubs, gallops or clicks. No pedal edema. Gastrointestinal system: Abdomen is nondistended, soft and nontender. No organomegaly or masses felt. Normal bowel sounds heard. Central nervous system: Alert and oriented x3. No focal neurological deficits.   Data Reviewed:   I have personally reviewed following labs and imaging studies:  Labs: Labs show the following:   Basic Metabolic Panel: Recent Labs  Lab 02/08/20 0926 02/09/20 0342 02/10/20 0346 02/11/20 0346 02/12/20 0456  NA 136 136 133* 136 135  K 3.5 3.6 3.3* 3.6 3.6  CL 97* 98 96* 98 97*  CO2 29 30 30 30 30   GLUCOSE 85 82 104* 85 80  BUN 24* 9 12 <5* 9  CREATININE 6.32* 3.39* 4.37* 2.27* 3.40*  CALCIUM 8.3* 8.4* 8.2* 8.0* 7.9*  MG  --   --  1.9  --   --   PHOS 3.6  --  2.7  --   --    GFR Estimated Creatinine Clearance: 15.4 mL/min (A) (by C-G formula based on SCr of 3.4 mg/dL (H)). Liver Function Tests: No results for input(s): AST, ALT, ALKPHOS, BILITOT, PROT, ALBUMIN in the last 168 hours.  CBC: Recent Labs  Lab 02/09/20 0342 02/10/20 0346 02/11/20 0346 02/12/20 0456  WBC 4.9 5.6 4.8 5.9  NEUTROABS  --   --  3.0 3.4  HGB 7.5* 7.8* 7.6* 7.5*  HCT 23.9* 25.3* 24.3*  24.1*  MCV 91.9 92.7 92.0 93.1  PLT 125* 131* 125* 161   CBG: Recent Labs  Lab 02/12/20 0247 02/12/20 0544 02/12/20 0600 02/12/20 0617 02/12/20 0748  GLUCAP 75 65* 66* 71 117*   Sepsis Labs: Recent Labs  Lab 02/09/20 0342 02/10/20 0346 02/11/20 0346 02/12/20 0456  WBC 4.9 5.6 4.8 5.9    Microbiology Recent Results (from the past 240 hour(s))  Resp Panel by RT-PCR (Flu A&B, Covid) Nasopharyngeal Swab     Status: None   Collection Time: 02/02/20  7:36 PM   Specimen: Nasopharyngeal Swab; Nasopharyngeal(NP) swabs in vial transport medium  Result Value Ref Range Status   SARS Coronavirus 2 by RT PCR NEGATIVE NEGATIVE Final    Comment: (NOTE) SARS-CoV-2 target nucleic acids are NOT DETECTED.  The SARS-CoV-2 RNA is generally detectable in upper respiratory specimens during the acute phase of infection. The lowest concentration of SARS-CoV-2 viral copies this assay can detect is 138 copies/mL. A negative result does not preclude SARS-Cov-2 infection and should not be used as the sole basis for treatment or other patient management decisions. A negative result may occur with  improper specimen collection/handling, submission of specimen other than nasopharyngeal swab, presence of viral mutation(s) within the areas targeted by this assay, and inadequate number of viral copies(<138 copies/mL). A negative result must be combined with clinical observations, patient history, and epidemiological information. The expected result is Negative.  Fact Sheet for Patients:  EntrepreneurPulse.com.au  Fact Sheet for Healthcare Providers:  IncredibleEmployment.be  This test is no t yet approved or cleared by the [place] and  has been authorized  for detection and/or diagnosis of SARS-CoV-2 by FDA under an Emergency Use Authorization (EUA). This EUA will remain  in effect (meaning this test can be used) for the duration of the COVID-19  declaration under Section 564(b)(1) of the Act, 21 U.S.C.section 360bbb-3(b)(1), unless the authorization is terminated  or revoked sooner.       Influenza A by PCR NEGATIVE NEGATIVE Final   Influenza B by PCR NEGATIVE NEGATIVE Final    Comment: (NOTE) The Xpert Xpress SARS-CoV-2/FLU/RSV plus assay is intended as an aid in the diagnosis of influenza from Nasopharyngeal swab specimens and should not be used as a sole basis for treatment. Nasal washings and aspirates are unacceptable for Xpert Xpress SARS-CoV-2/FLU/RSV testing.  Fact Sheet for Patients: EntrepreneurPulse.com.au  Fact Sheet for Healthcare Providers: IncredibleEmployment.be  This test is not yet approved or cleared by the [place] FDA and has been authorized for detection and/or diagnosis of SARS-CoV-2 by FDA under an Emergency Use Authorization (EUA). This EUA will remain in effect (meaning this test can be used) for the duration of the COVID-19 declaration under Section 564(b)(1) of the Act, 21 U.S.C. section 360bbb-3(b)(1), unless the authorization is terminated or revoked.  Performed at [facility], [address] [place], [place] [address]   MRSA PCR Screening     Status: None   Collection Time: 02/03/20  1:45 PM   Specimen: Nasopharyngeal  Result Value Ref Range Status   MRSA by PCR NEGATIVE NEGATIVE Final    Comment:        The GeneXpert MRSA Assay (FDA approved for NASAL specimens only), is one component of a comprehensive MRSA colonization surveillance program. It is not intended to diagnose MRSA infection nor to guide or monitor treatment for MRSA infections. Performed at [facility], [address] [place], [place] [address]   CULTURE, BLOOD (ROUTINE X 2) w Reflex to ID Panel     Status: None (Preliminary result)   Collection Time: 02/09/20  3:53 AM   Specimen: BLOOD  Result Value Ref Range Status   Specimen Description BLOOD  RIGHT HAND  Final   Special Requests   Final    BOTTLES DRAWN AEROBIC AND ANAEROBIC Blood Culture adequate volume   Culture   Final    NO GROWTH 3 DAYS Performed at [facility], [address], [place], [place] [address]    Report Status PENDING  Incomplete  CULTURE, BLOOD (ROUTINE X 2) w Reflex to ID Panel     Status: None (Preliminary result)   Collection Time: 02/09/20  3:54 AM   Specimen: BLOOD  Result Value Ref Range Status   Specimen Description BLOOD RIGHT FA  Final   Special Requests   Final    BOTTLES DRAWN AEROBIC AND ANAEROBIC Blood Culture adequate volume   Culture   Final    NO GROWTH 3 DAYS Performed at [facility], [address] [place], [place] [address]    Report Status PENDING  Incomplete    Procedures and diagnostic studies:  No results found.    LOS: 9 days   Darliss Cheney , MD [facility]   Pager on [email or web address]. If 7PM-7AM, please contact night-coverage at [email or web address]   02/12/2020, 1:32 PM

## 2020-02-12 NOTE — Progress Notes (Signed)
Patient's BS were low during BS check during the night running from 63-66. During the first episode orange cream was given and BS was 76 15 mins later. During second episode early in the morning her BS was 65, 15 mins later 66 (given orange cream protein but felt nauseated with it and gave zofran), and 15 mins  after that 71(gave sips of sprite). Patient did not eat hardly any dinner per husband and day shift report and hasn't been eating much during the day.

## 2020-02-13 LAB — CBC
HCT: 24.1 % — ABNORMAL LOW (ref 36.0–46.0)
Hemoglobin: 7.5 g/dL — ABNORMAL LOW (ref 12.0–15.0)
MCH: 28.7 pg (ref 26.0–34.0)
MCHC: 31.1 g/dL (ref 30.0–36.0)
MCV: 92.3 fL (ref 80.0–100.0)
Platelets: 164 10*3/uL (ref 150–400)
RBC: 2.61 MIL/uL — ABNORMAL LOW (ref 3.87–5.11)
RDW: 15.8 % — ABNORMAL HIGH (ref 11.5–15.5)
WBC: 5.2 10*3/uL (ref 4.0–10.5)
nRBC: 0 % (ref 0.0–0.2)

## 2020-02-13 LAB — BASIC METABOLIC PANEL
Anion gap: 8 (ref 5–15)
BUN: <5 mg/dL — ABNORMAL LOW (ref 8–23)
CO2: 30 mmol/L (ref 22–32)
Calcium: 8.1 mg/dL — ABNORMAL LOW (ref 8.9–10.3)
Chloride: 98 mmol/L (ref 98–111)
Creatinine, Ser: 2.16 mg/dL — ABNORMAL HIGH (ref 0.44–1.00)
GFR, Estimated: 25 mL/min — ABNORMAL LOW (ref >60)
Glucose, Bld: 74 mg/dL (ref 70–99)
Potassium: 3.8 mmol/L (ref 3.5–5.1)
Sodium: 136 mmol/L (ref 135–145)

## 2020-02-13 LAB — GLUCOSE, CAPILLARY
Glucose-Capillary: 74 mg/dL (ref 70–99)
Glucose-Capillary: 68 mg/dL — ABNORMAL LOW (ref 70–99)
Glucose-Capillary: 70 mg/dL (ref 70–99)

## 2020-02-13 MED ORDER — AMIODARONE HCL 100 MG PO TABS: 100.0000 mg | ORAL_TABLET | Freq: Every day | ORAL | 0 refills | Status: AC

## 2020-02-13 MED ORDER — HYDRALAZINE HCL 25 MG PO TABS: 25.0000 mg | ORAL_TABLET | Freq: Three times a day (TID) | ORAL | 0 refills | Status: AC

## 2020-02-13 MED ORDER — GABAPENTIN 100 MG PO CAPS: 100.0000 mg | ORAL_CAPSULE | Freq: Every day | ORAL | 0 refills | Status: AC

## 2020-02-13 NOTE — Discharge Summary (Signed)
Physician Discharge Summary  Jamie Arias TKW:[id number] DOB: 1955-11-29 DOA: 02/02/2020  PCP: [name], MD  Admit date: 02/02/2020 Discharge date: 02/13/2020  Admitted From: Home Disposition: Home  Recommendations for Outpatient Follow-up:  1. Follow up with PCP in 1-2 weeks 2. Follow-up for routine dialysis outpatient on Monday Wednesday Friday 3. Please obtain BMP/CBC in one week 4. Please follow up with your PCP on the following pending results: Unresulted Labs (From admission, onward)         None       Home Health: None Equipment/Devices: None  Discharge Condition: Stable CODE STATUS: Full code Diet recommendation: Renal  Subjective: Seen and examined.  Much more alert and oriented.  No complaints.  Brief/Interim Summary: Jamie Arias is a 64 y.o. female with PMH of ESRD on HD MWF, chronic diastolic failure, Afib, CAD, and physical deconditioning who is wheelchair bound who presented to ED with altered mental status after missing 5 dialysis sessions.    Nephrology consulted.  Patient was started back on hemodialysis.  She initially received serial hemodialysis almost on daily basis.  CT head was unremarkable.  With more frequent hemodialysis, patient's mental status improved which indicated that her acute encephalopathy was likely secondary to uremia.  Her blood pressure was slightly elevated so her hydralazine was increased from 10 mg twice daily to 25 mg 3 times daily.  Rest of the medical issues remained stable.  She is now alert and oriented, back to her baseline so she is being discharged home.  I have personally discussed discharge plan with patient's husband over the phone who is in agreement.  Discharge Diagnoses:  Principal Problem:   Acute metabolic encephalopathy Active Problems:   Hypothyroid   HTN (hypertension)   Type 2 diabetes mellitus with ESRD (end-stage renal disease) (HCC)   Coronary artery disease   Atrial fibrillation, chronic (HCC)    Depression   Chronic diastolic CHF (congestive heart failure) (HCC)   ESRD (end stage renal disease) (HCC)   Pressure injury of skin   Anemia of chronic renal failure, stage 5 (HCC)   Hypoglycemia   Functional quadriplegia (HCC)    Discharge Instructions   Allergies as of 02/13/2020      Reactions   Nsaids Other (See Comments)   Contraindicated due to kidney disease.   5-alpha Reductase Inhibitors    Doxycycline Other (See Comments)   tremor      Medication List    STOP taking these medications   acetaminophen 325 MG tablet Commonly known as: TYLENOL     TAKE these medications   amiodarone 100 MG tablet Commonly known as: Pacerone Take 1 tablet (100 mg total) by mouth daily. Start taking on: February 14, 2020 What changed:   medication strength  See the new instructions.   ascorbic acid 500 MG tablet Commonly known as: VITAMIN C Take 1 tablet (500 mg total) by mouth daily.   atorvastatin 40 MG tablet Commonly known as: LIPITOR Take 1 tablet (40 mg total) by mouth daily.   feeding supplement (NEPRO CARB STEADY) Liqd Take 237 mLs by mouth 2 (two) times daily between meals.   FLUoxetine 20 MG capsule Commonly known as: PROZAC Take 1 capsule by mouth once daily   fluticasone 50 MCG/ACT nasal spray Commonly known as: FLONASE Place 2 sprays into both nostrils daily. What changed:   when to take this  reasons to take this   folic acid 1 MG tablet Commonly known as: FOLVITE Take 1 tablet (  1 mg total) by mouth daily.   gabapentin 100 MG capsule Commonly known as: NEURONTIN Take 1 capsule (100 mg total) by mouth at bedtime. What changed: when to take this   hydrALAZINE 25 MG tablet Commonly known as: APRESOLINE Take 1 tablet (25 mg total) by mouth every 8 (eight) hours. What changed:   medication strength  how much to take  when to take this   hydrocortisone cream 1 % Apply topically as needed for itching.   levothyroxine 88 MCG  tablet Commonly known as: SYNTHROID Take 1 tablet (88 mcg total) by mouth daily before breakfast.   lidocaine-prilocaine cream Commonly known as: EMLA Apply 1 application topically as needed.   melatonin 3 MG Tabs tablet Take 3 mg by mouth at bedtime as needed (sleep).   multivitamin Tabs tablet Take 1 tablet by mouth at bedtime.   nitroGLYCERIN 0.4 MG SL tablet Commonly known as: NITROSTAT DISSOLVE ONE TABLET UNDER THE TONGUE EVERY 5 MINUTES AS NEEDED FOR CHEST PAIN.  DO NOT EXCEED A TOTAL OF 3 DOSES IN 15 MINUTES What changed: See the new instructions.   omeprazole 20 MG capsule Commonly known as: PRILOSEC Take 20 mg by mouth daily.   ondansetron 4 MG disintegrating tablet Commonly known as: ZOFRAN-ODT DISSOLVE 1 TABLET IN MOUTH EVERY 8 HOURS AS NEEDED FOR NAUSEA AND VOMITING. PATIENT NEEDS APPOINTMENT WITH PCP TO RECEIVE FURTHER REFILLS   traZODone 50 MG tablet Commonly known as: DESYREL TAKE 1 TABLET BY MOUTH AT BEDTIME (APPOINTMENT  REQUIRED  FOR  FUTURE  REFILLS)   VITAMIN D (CHOLECALCIFEROL) PO Take 1 capsule by mouth once a week.       Follow-up Information    [name], MD Follow up in 1 week(s).   Specialty: Family Medicine Contact information: [address] [place] [address] [phone]        Wellington Hampshire, MD .   Specialty: Cardiology Contact information: [address] [place] [address] [phone]              Allergies  Allergen Reactions  . Nsaids Other (See Comments)    Contraindicated due to kidney disease.  Judeth Cornfield Reductase Inhibitors   . Doxycycline Other (See Comments)    tremor    Consultations: Nephrology   Procedures/Studies: CT HEAD WO CONTRAST  Result Date: 02/09/2020 CLINICAL DATA:  64 year old with mental status change. EXAM: CT HEAD WITHOUT CONTRAST TECHNIQUE: Contiguous axial images were obtained from the base of the skull through the vertex without  intravenous contrast. COMPARISON:  10/11/2019 FINDINGS: Brain: Low-density in the periventricular white matter is similar to the previous examination. Subtle low-density along the right side of the caudate is similar to the prior CT. Negative for acute hemorrhage, mass lesion, midline shift, hydrocephalus or large infarct. Vascular: No hyperdense vessel or unexpected calcification. Skull: Normal. Negative for fracture or focal lesion. Sinuses/Orbits: Fluid in left mastoid air cells. Other: None IMPRESSION: 1. No acute intracranial abnormality. 2. Periventricular low density is most compatible with chronic small vessel ischemic changes. 3. Small amount of fluid in the left mastoid air cells. Electronically Signed   By: Markus Daft M.D.   On: 02/09/2020 12:05   PERIPHERAL VASCULAR CATHETERIZATION  Result Date: 02/05/2020 See op note  PERIPHERAL VASCULAR CATHETERIZATION  Result Date: 02/04/2020 See op note  DG Chest Port 1 View  Result Date: 02/02/2020 CLINICAL DATA:  End-stage renal disease, uremia EXAM: PORTABLE CHEST 1 VIEW COMPARISON:  10/08/2019 FINDINGS:  The lungs are symmetrically expanded. Trace interstitial pulmonary edema has developed, new since prior examination. No pneumothorax or pleural effusion. Stable left pleural thickening. Coronary artery bypass grafting has been performed. Cardiac size is at the upper limits of normal. No acute bone abnormality. IMPRESSION: Interval development of trace perihilar interstitial pulmonary edema. Electronically Signed   By: Fidela Salisbury MD   On: 02/02/2020 22:11      Discharge Exam: Vitals:   02/12/20 2327 02/13/20 0431  BP: (!) 133/100 (!) 133/39  Pulse: 66 64  Resp: 18 18  Temp: 97.8 F (36.6 C) 98.4 F (36.9 C)  SpO2: 98% 100%   Vitals:   02/12/20 1622 02/12/20 2000 02/12/20 2327 02/13/20 0431  BP: 110/83 (!) 123/41 (!) 133/100 (!) 133/39  Pulse: 68 69 66 64  Resp: 15 17 18 18   Temp: 98.9 F (37.2 C) 98.2 F (36.8 C) 97.8 F  (36.6 C) 98.4 F (36.9 C)  TempSrc: Oral Oral Oral Oral  SpO2: 92% 98% 98% 100%  Weight:      Height:        General: Pt is alert, awake, not in acute distress Cardiovascular: RRR, S1/S2 +, no rubs, no gallops Respiratory: CTA bilaterally, no wheezing, no rhonchi Abdominal: Soft, NT, ND, bowel sounds + Extremities: no edema, no cyanosis    The results of significant diagnostics from this hospitalization (including imaging, microbiology, ancillary and laboratory) are listed below for reference.     Microbiology: Recent Results (from the past 240 hour(s))  MRSA PCR Screening     Status: None   Collection Time: 02/03/20  1:45 PM   Specimen: Nasopharyngeal  Result Value Ref Range Status   MRSA by PCR NEGATIVE NEGATIVE Final    Comment:        The GeneXpert MRSA Assay (FDA approved for NASAL specimens only), is one component of a comprehensive MRSA colonization surveillance program. It is not intended to diagnose MRSA infection nor to guide or monitor treatment for MRSA infections. Performed at [facility], [address] [place], [place] [address]   CULTURE, BLOOD (ROUTINE X 2) w Reflex to ID Panel     Status: None (Preliminary result)   Collection Time: 02/09/20  3:53 AM   Specimen: BLOOD  Result Value Ref Range Status   Specimen Description BLOOD RIGHT HAND  Final   Special Requests   Final    BOTTLES DRAWN AEROBIC AND ANAEROBIC Blood Culture adequate volume   Culture   Final    NO GROWTH 4 DAYS Performed at [facility], [address] [place], [place] [address]    Report Status PENDING  Incomplete  CULTURE, BLOOD (ROUTINE X 2) w Reflex to ID Panel     Status: None (Preliminary result)   Collection Time: 02/09/20  3:54 AM   Specimen: BLOOD  Result Value Ref Range Status   Specimen Description BLOOD RIGHT FA  Final   Special Requests   Final    BOTTLES DRAWN AEROBIC AND ANAEROBIC Blood Culture adequate volume   Culture   Final    NO  GROWTH 4 DAYS Performed at [facility], [address] [place],  [address]    Report Status PENDING  Incomplete     Labs: BNP (last 3 results) Recent Labs    03/01/19 0429  BNP 254.2*   Basic Metabolic Panel: Recent Labs  Lab 02/08/20 0926 02/09/20 0342 02/10/20 0346 02/11/20 0346 02/12/20 0456 02/13/20 0529  NA 136 136 133* 136 135  136  K 3.5 3.6 3.3* 3.6 3.6 3.8  CL 97* 98 96* 98 97* 98  CO2 29 30 30 30 30 30   GLUCOSE 85 82 104* 85 80 74  BUN 24* 9 12 <5* 9 <5*  CREATININE 6.32* 3.39* 4.37* 2.27* 3.40* 2.16*  CALCIUM 8.3* 8.4* 8.2* 8.0* 7.9* 8.1*  MG  --   --  1.9  --   --   --   PHOS 3.6  --  2.7  --  2.5  --    Liver Function Tests: No results for input(s): AST, ALT, ALKPHOS, BILITOT, PROT, ALBUMIN in the last 168 hours. No results for input(s): LIPASE, AMYLASE in the last 168 hours. No results for input(s): AMMONIA in the last 168 hours. CBC: Recent Labs  Lab 02/09/20 0342 02/10/20 0346 02/11/20 0346 02/12/20 0456 02/13/20 0529  WBC 4.9 5.6 4.8 5.9 5.2  NEUTROABS  --   --  3.0 3.4  --   HGB 7.5* 7.8* 7.6* 7.5* 7.5*  HCT 23.9* 25.3* 24.3* 24.1* 24.1*  MCV 91.9 92.7 92.0 93.1 92.3  PLT 125* 131* 125* 161 164   Cardiac Enzymes: No results for input(s): CKTOTAL, CKMB, CKMBINDEX, TROPONINI in the last 168 hours. BNP: Invalid input(s): POCBNP CBG: Recent Labs  Lab 02/12/20 1941 02/12/20 2326 02/12/20 2353 02/13/20 0415 02/13/20 0748  GLUCAP 76 67* 75 74 68*   D-Dimer No results for input(s): DDIMER in the last 72 hours. Hgb A1c No results for input(s): HGBA1C in the last 72 hours. Lipid Profile No results for input(s): CHOL, HDL, LDLCALC, TRIG, CHOLHDL, LDLDIRECT in the last 72 hours. Thyroid function studies No results for input(s): TSH, T4TOTAL, T3FREE, THYROIDAB in the last 72 hours.  Invalid input(s): FREET3 Anemia work up No results for input(s): VITAMINB12, FOLATE, FERRITIN, TIBC, IRON, RETICCTPCT in the last 72  hours. Urinalysis    Component Value Date/Time   COLORURINE YELLOW (A) 10/08/2019 1431   APPEARANCEUR TURBID (A) 10/08/2019 1431   APPEARANCEUR Hazy 07/23/2013 0741   LABSPEC 1.010 10/08/2019 1431   LABSPEC 1.016 07/23/2013 0741   PHURINE 7.0 10/08/2019 1431   GLUCOSEU NEGATIVE 10/08/2019 1431   GLUCOSEU Negative 07/23/2013 0741   HGBUR SMALL (A) 10/08/2019 1431   BILIRUBINUR NEGATIVE 10/08/2019 1431   BILIRUBINUR Negative 07/23/2013 0741   KETONESUR 5 (A) 10/08/2019 1431   PROTEINUR >=300 (A) 10/08/2019 1431   UROBILINOGEN 0.2 06/30/2013 2213   NITRITE NEGATIVE 10/08/2019 1431   LEUKOCYTESUR MODERATE (A) 10/08/2019 1431   LEUKOCYTESUR Negative 07/23/2013 0741   Sepsis Labs Invalid input(s): PROCALCITONIN,  WBC,  LACTICIDVEN Microbiology Recent Results (from the past 240 hour(s))  MRSA PCR Screening     Status: None   Collection Time: 02/03/20  1:45 PM   Specimen: Nasopharyngeal  Result Value Ref Range Status   MRSA by PCR NEGATIVE NEGATIVE Final    Comment:        The GeneXpert MRSA Assay (FDA approved for NASAL specimens only), is one component of a comprehensive MRSA colonization surveillance program. It is not intended to diagnose MRSA infection nor to guide or monitor treatment for MRSA infections. Performed at [facility], [address] [place], [place] [address]   CULTURE, BLOOD (ROUTINE X 2) w Reflex to ID Panel     Status: None (Preliminary result)   Collection Time: 02/09/20  3:53 AM   Specimen: BLOOD  Result Value Ref Range Status   Specimen Description BLOOD RIGHT HAND  Final   Special Requests  Final    BOTTLES DRAWN AEROBIC AND ANAEROBIC Blood Culture adequate volume   Culture   Final    NO GROWTH 4 DAYS Performed at [facility], [address] [place], [place] [address]    Report Status PENDING  Incomplete  CULTURE, BLOOD (ROUTINE X 2) w Reflex to ID Panel     Status: None (Preliminary result)   Collection Time: 02/09/20   3:54 AM   Specimen: BLOOD  Result Value Ref Range Status   Specimen Description BLOOD RIGHT FA  Final   Special Requests   Final    BOTTLES DRAWN AEROBIC AND ANAEROBIC Blood Culture adequate volume   Culture   Final    NO GROWTH 4 DAYS Performed at [facility], [address] [place], [place] [address]    Report Status PENDING  Incomplete     Time coordinating discharge: Over 30 minutes  SIGNED:   Darliss Cheney, MD  [facility] 02/13/2020, 10:50 AM  If 7PM-7AM, please contact night-coverage [email or web address]

## 2020-02-13 NOTE — Plan of Care (Signed)
Discharge teaching completed with patient's husband, patient is in stable condition, patient transported via EMS to home.

## 2020-02-13 NOTE — Progress Notes (Addendum)
[facility]  ROUNDING NOTE   Subjective:   Patient reports Jamie Arias has very little appetite, did not eat any of her breakfast Denies any shortness of breath, edema, Nausea/vomiting Jamie Arias is answering questions appropriately  Had HD yesterday, Jamie Arias doesn't remember having any problems with treatment   Objective:  Vital signs in last 24 hours:  Temp:  [97.8 F (36.6 C)-98.9 F (37.2 C)] 98.4 F (36.9 C) (12/11 0431) Pulse Rate:  [64-73] 64 (12/11 0431) Resp:  [7-18] 18 (12/11 0431) BP: (110-160)/(39-138) 133/39 (12/11 0431) SpO2:  [92 %-100 %] 100 % (12/11 0431)  Weight change:  Filed Weights   02/04/20 1528 02/05/20 1412 02/06/20 0500  Weight: 74.8 kg 74.8 kg 68.3 kg    Intake/Output: I/O last 3 completed shifts: In: 0  Out: 1000 [Other:1000]   Intake/Output this shift:  No intake/output data recorded.  Physical Exam: General: NAD,   Head: Normocephalic, atraumatic. Moist oral mucosal membranes  Eyes: Anicteric, PERRL  Neck: Supple, trachea midline  Lungs:  Clear to auscultation  Heart: Regular rate and rhythm  Abdomen:  Soft, nontender,   Extremities:  No peripheral edema.  Neurologic: Nonfocal, moving all four extremities  Skin: No lesions  Access: Right femoral tunneled catheter 02/05/20     Basic Metabolic Panel: Recent Labs  Lab 02/08/20 0926 02/09/20 0342 02/10/20 0346 02/11/20 0346 02/12/20 0456 02/13/20 0529  NA 136 136 133* 136 135 136  K 3.5 3.6 3.3* 3.6 3.6 3.8  CL 97* 98 96* 98 97* 98  CO2 29 30 30 30 30 30   GLUCOSE 85 82 104* 85 80 74  BUN 24* 9 12 <5* 9 <5*  CREATININE 6.32* 3.39* 4.37* 2.27* 3.40* 2.16*  CALCIUM 8.3* 8.4* 8.2* 8.0* 7.9* 8.1*  MG  --   --  1.9  --   --   --   PHOS 3.6  --  2.7  --  2.5  --     Liver Function Tests: No results for input(s): AST, ALT, ALKPHOS, BILITOT, PROT, ALBUMIN in the last 168 hours. No results for input(s): LIPASE, AMYLASE in the last 168 hours. No results for input(s): AMMONIA in the  last 168 hours.  CBC: Recent Labs  Lab 02/09/20 0342 02/10/20 0346 02/11/20 0346 02/12/20 0456 02/13/20 0529  WBC 4.9 5.6 4.8 5.9 5.2  NEUTROABS  --   --  3.0 3.4  --   HGB 7.5* 7.8* 7.6* 7.5* 7.5*  HCT 23.9* 25.3* 24.3* 24.1* 24.1*  MCV 91.9 92.7 92.0 93.1 92.3  PLT 125* 131* 125* 161 164    Cardiac Enzymes: No results for input(s): CKTOTAL, CKMB, CKMBINDEX, TROPONINI in the last 168 hours.  BNP: Invalid input(s): POCBNP  CBG: Recent Labs  Lab 02/12/20 1941 02/12/20 2326 02/12/20 2353 02/13/20 0415 02/13/20 0748  GLUCAP 76 67* 75 74 68*    Microbiology: Results for orders placed or performed during the hospital encounter of 02/02/20  Resp Panel by RT-PCR (Flu A&B, Covid) Nasopharyngeal Swab     Status: None   Collection Time: 02/02/20  7:36 PM   Specimen: Nasopharyngeal Swab; Nasopharyngeal(NP) swabs in vial transport medium  Result Value Ref Range Status   SARS Coronavirus 2 by RT PCR NEGATIVE NEGATIVE Final    Comment: (NOTE) SARS-CoV-2 target nucleic acids are NOT DETECTED.  The SARS-CoV-2 RNA is generally detectable in upper respiratory specimens during the acute phase of infection. The lowest concentration of SARS-CoV-2 viral copies this assay can detect is 138 copies/mL. A negative result  does not preclude SARS-Cov-2 infection and should not be used as the sole basis for treatment or other patient management decisions. A negative result may occur with  improper specimen collection/handling, submission of specimen other than nasopharyngeal swab, presence of viral mutation(s) within the areas targeted by this assay, and inadequate number of viral copies(<138 copies/mL). A negative result must be combined with clinical observations, patient history, and epidemiological information. The expected result is Negative.  Fact Sheet for Patients:  EntrepreneurPulse.com.au  Fact Sheet for Healthcare Providers:   IncredibleEmployment.be  This test is no t yet approved or cleared by the [place] FDA and  has been authorized for detection and/or diagnosis of SARS-CoV-2 by FDA under an Emergency Use Authorization (EUA). This EUA will remain  in effect (meaning this test can be used) for the duration of the COVID-19 declaration under Section 564(b)(1) of the Act, 21 U.S.C.section 360bbb-3(b)(1), unless the authorization is terminated  or revoked sooner.       Influenza A by PCR NEGATIVE NEGATIVE Final   Influenza B by PCR NEGATIVE NEGATIVE Final    Comment: (NOTE) The Xpert Xpress SARS-CoV-2/FLU/RSV plus assay is intended as an aid in the diagnosis of influenza from Nasopharyngeal swab specimens and should not be used as a sole basis for treatment. Nasal washings and aspirates are unacceptable for Xpert Xpress SARS-CoV-2/FLU/RSV testing.  Fact Sheet for Patients: EntrepreneurPulse.com.au  Fact Sheet for Healthcare Providers: IncredibleEmployment.be  This test is not yet approved or cleared by the [place] FDA and has been authorized for detection and/or diagnosis of SARS-CoV-2 by FDA under an Emergency Use Authorization (EUA). This EUA will remain in effect (meaning this test can be used) for the duration of the COVID-19 declaration under Section 564(b)(1) of the Act, 21 U.S.C. section 360bbb-3(b)(1), unless the authorization is terminated or revoked.  Performed at [facility], [address] [place], [place] [address]   MRSA PCR Screening     Status: None   Collection Time: 02/03/20  1:45 PM   Specimen: Nasopharyngeal  Result Value Ref Range Status   MRSA by PCR NEGATIVE NEGATIVE Final    Comment:        The GeneXpert MRSA Assay (FDA approved for NASAL specimens only), is one component of a comprehensive MRSA colonization surveillance program. It is not intended to diagnose MRSA infection nor to guide  or monitor treatment for MRSA infections. Performed at [facility], [address] [place], [place] [address]   CULTURE, BLOOD (ROUTINE X 2) w Reflex to ID Panel     Status: None (Preliminary result)   Collection Time: 02/09/20  3:53 AM   Specimen: BLOOD  Result Value Ref Range Status   Specimen Description BLOOD RIGHT HAND  Final   Special Requests   Final    BOTTLES DRAWN AEROBIC AND ANAEROBIC Blood Culture adequate volume   Culture   Final    NO GROWTH 4 DAYS Performed at [facility], [address] [place], [place] [address]    Report Status PENDING  Incomplete  CULTURE, BLOOD (ROUTINE X 2) w Reflex to ID Panel     Status: None (Preliminary result)   Collection Time: 02/09/20  3:54 AM   Specimen: BLOOD  Result Value Ref Range Status   Specimen Description BLOOD RIGHT FA  Final   Special Requests   Final    BOTTLES DRAWN AEROBIC AND ANAEROBIC Blood Culture adequate volume   Culture   Final    NO GROWTH 4  DAYS Performed at [facility], [address] [place], [place] [address]    Report Status PENDING  Incomplete    Coagulation Studies: No results for input(s): LABPROT, INR in the last 72 hours.  Urinalysis: No results for input(s): COLORURINE, LABSPEC, PHURINE, GLUCOSEU, HGBUR, BILIRUBINUR, KETONESUR, PROTEINUR, UROBILINOGEN, NITRITE, LEUKOCYTESUR in the last 72 hours.  Invalid input(s): APPERANCEUR    Imaging: No results found.   Medications:    . amiodarone  100 mg Oral Daily  . atorvastatin  40 mg Oral QHS  . Chlorhexidine Gluconate Cloth  6 each Topical Q0600  . epoetin (EPOGEN/PROCRIT) injection  10,000 Units Intravenous Q M,W,F-HD  . feeding supplement (NEPRO CARB STEADY)  237 mL Oral BID BM  . FLUoxetine  20 mg Oral QHS  . folic acid  1 mg Oral Daily  . gabapentin  100 mg Oral QHS  . heparin injection (subcutaneous)  5,000 Units Subcutaneous Q12H  . hydrALAZINE  25 mg Oral Q8H  . insulin aspart  0-5 Units  Subcutaneous QHS  . insulin aspart  0-6 Units Subcutaneous TID WC  . levothyroxine  88 mcg Oral Q0600  . lidocaine-prilocaine   Topical Once  . pantoprazole  40 mg Oral Daily   acetaminophen **OR** acetaminophen, fluticasone, heparin, HYDROmorphone (DILAUDID) injection, melatonin, ondansetron **OR** ondansetron (ZOFRAN) IV, ondansetron (ZOFRAN) IV  Assessment/ Plan:  Jamie Arias is a 64 y.o.  female with hypothyroidism, HTN, DM type 2, CAD, A. Fib, Depression, Chronic diastolic CHF who presented with acute metabolic encephalopathy on 02/02/20.   [facility]/ [facility]/ [facility]  1. ESRD on HD  - patient missed 5 outpatient dialysis treatments  - Last HD treatment yesterday.  - Continue on MF schedule.   2. Complication of Dialysis  Access  - Left tunneled dialysis catheter placed 02/05/20  - had fistulogram and stent of left AV graft on 02/04/20. Dialysis staff continue to have issues with cannulation.   3. Anemia of CKD  - Hgb 7.5 - receiving epo   4. Secondary hyperparathyroidism   5. DM type 2 with CKD  - multiple hypoglycemic episodes this admission  -  BG 68  - encouraged oral intake    LOS: [place] 12/11/202111:39 AM

## 2020-02-13 NOTE — Plan of Care (Signed)
Continuing with plan of care. 

## 2020-02-13 NOTE — Discharge Instructions (Signed)
Dialysis Dialysis is a procedure that is done when the kidneys have stopped working properly (kidney failure). It may also be done earlier if it may help improve symptoms. During dialysis, wastes, salt, and extra water are removed from the blood, and the levels of certain minerals in the blood are maintained. Dialysis is done in sessions which are continued until the kidneys get better. If the kidneys cannot get better, such as in end-stage kidney disease, dialysis is continued for life or until you receive a new kidney from a donor (kidney transplant). There are two types of dialysis: hemodialysis and peritoneal dialysis. What is hemodialysis?        Hemodialysis is when a machine called a dialyzer is used to filter the blood. Before starting hemodialysis, you will have surgery to create a site where blood can be removed from the body and returned to the body (vascular access). There are three types of vascular accesses:  Arteriovenous fistula. This type of access is created when an artery and a vein (usually in the arm) are connected during surgery. The arteriovenous fistula usually takes 1-6 months to develop after surgery. It may last longer than the other types of vascular accesses and is less likely to become infected or cause blood clots.  Arteriovenous graft. This type of access is created when an artery and a vein in the arm are connected during surgery with a tube. An arteriovenous graft can usually be used within 2-3 weeks of surgery.  A venous catheter. To create this type of access, a thin tube (catheter) is placed in a large vein in your neck, chest, or groin. A venous catheter can be used right away. It is usually used as a temporary access when dialysis needs to begin immediately. During hemodialysis, blood leaves your body through your access site. It travels through a tube to the dialyzer, where it is filtered. The blood then returns to your body through another tube. Hemodialysis  is usually done at a hospital or dialysis center three times a week. Visits last about 3-5 hours. With special training, it may also be done at home with the help of another person. What is peritoneal dialysis? Peritoneal dialysis is when the thin lining of the abdomen (peritoneum) and a fluid called dialysate are used to filter the blood. Before starting peritoneal dialysis, you will have surgery to place a catheter in your abdomen. The catheter will be used to transfer dialysate to and from your abdomen. At the start of a session, your abdomen is filled with dialysate. During the session, wastes, salt, and extra water in the blood pass through the peritoneum and into the dialysate. The dialysate is drained from the body at the end of the session. The process of filling and draining the dialysate is called an exchange. Exchanges are repeated until you have used up all the dialysate for the day. You may do peritoneal dialysis at home or at almost any other location. It is done every day. You may need up to five exchanges a day. Each exchange takes about 30-40 minutes. The amount of time the dialysate is in your body between exchanges is called a dwell. The dwell usually lasts 1.5-3 hours and can vary with each person. You may choose to do exchanges at night while you sleep, using a machine called a cycler. Which type of dialysis should I choose? Both types of dialysis have advantages and disadvantages. Talk with your health care provider about which type of dialysis is best  for you. Your lifestyle, preferences, and medical condition should be considered. In some cases, only one type of dialysis can be chosen. Advantages of hemodialysis  It is done less often than peritoneal dialysis.  Someone else can do the dialysis for you.  If you go to a dialysis center: ? Your health care provider can recognize any problems you may be having. ? You can interact with others who are having dialysis. This can  provide you with emotional support. Disadvantages of hemodialysis  Hemodialysis may cause cramps and low blood pressure. It may leave you feeling tired on the days you have the treatment.  If you go to a dialysis center, you will need to make weekly appointments and work around the center's schedule.  You will need to take extra care when traveling. If you usually get treatment in a dialysis center, you will need to arrange to visit a dialysis center near your destination. If you are having treatments at home, you will need to take the dialyzer with you when traveling.  There are more eating restrictions than with peritoneal dialysis. Advantages of peritoneal dialysis  It is less likely than hemodialysis to cause cramps and low blood pressure.  There are fewer eating restrictions than with hemodialysis.  You may do exchanges on your own wherever you are, including when you travel. Disadvantages of peritoneal dialysis  It is done more often than hemodialysis.  Doing peritoneal dialysis requires you to have a good use (dexterity) of your hands. You must also be able to lift bags.  You must learn how to make your equipment free of germs (sterilization techniques). You will need to use these techniques every day to prevent infection. What changes will I need to make to my diet during dialysis? Both types of dialysis require you to make some changes to your diet. For example, you will need to limit your intake of foods that contain a lot of phosphorus and potassium. You will also need to limit your fluid intake. A diet and nutrition specialist (dietitian) can help you make a meal plan that can help improve your dialysis and your health. What should I expect when starting dialysis? Adjusting to the dialysis treatment, schedule, and diet can take some time. You may need to stop working and may not be able to do some of your normal activities. You may feel anxious or depressed when starting  dialysis. Over time, many people feel better overall because of dialysis. You may be able to return to work after making some changes, such as reducing work intensity. Where to find more information  [place]: www.kidney.org  American Association of Kidney Patients: BombTimer.gl  [employer]: www.kidneyfund.org Summary  During dialysis, wastes, salt, and extra water are removed from the blood, and the levels of certain minerals in the blood are maintained. There are two types of dialysis: hemodialysis and peritoneal dialysis.  Hemodialysis is when a machine called a dialyzer is used to filter the blood.  Hemodialysis is usually done by a health care provider at a hospital or dialysis center three times a week.  Peritoneal dialysis is when the peritoneum is used as a filter. You may do peritoneal dialysis at home or at almost any other location.  Both types of dialysis have advantages and disadvantages. Talk with your health care provider about which type of dialysis is best for you. This information is not intended to replace advice given to you by your health care provider. Make sure you  discuss any questions you have with your health care provider. Document Revised: 07/08/2018 Document Reviewed: 04/17/2016 Elsevier Patient Education  [place].

## 2020-02-14 LAB — CULTURE, BLOOD (ROUTINE X 2)
Culture: NO GROWTH
Culture: NO GROWTH
Special Requests: ADEQUATE
Special Requests: ADEQUATE

## 2020-02-15 ENCOUNTER — Telehealth: Payer: Self-pay

## 2020-02-15 DIAGNOSIS — Z992 Dependence on renal dialysis: Secondary | ICD-10-CM | POA: Diagnosis not present

## 2020-02-15 DIAGNOSIS — D631 Anemia in chronic kidney disease: Secondary | ICD-10-CM | POA: Diagnosis not present

## 2020-02-15 DIAGNOSIS — N2581 Secondary hyperparathyroidism of renal origin: Secondary | ICD-10-CM | POA: Diagnosis not present

## 2020-02-15 DIAGNOSIS — N186 End stage renal disease: Secondary | ICD-10-CM | POA: Diagnosis not present

## 2020-02-15 DIAGNOSIS — E1122 Type 2 diabetes mellitus with diabetic chronic kidney disease: Secondary | ICD-10-CM | POA: Diagnosis not present

## 2020-02-15 DIAGNOSIS — E039 Hypothyroidism, unspecified: Secondary | ICD-10-CM | POA: Diagnosis not present

## 2020-02-15 NOTE — Telephone Encounter (Signed)
Transition Care Management Unsuccessful Follow-up Telephone Call  Date of discharge and from where:  02/13/20 from [facility]  Attempts:  1st Attempt  Reason for unsuccessful TCM follow-up call:  No answer/busy.

## 2020-02-16 ENCOUNTER — Ambulatory Visit: Payer: Medicare Other | Admitting: Physician Assistant

## 2020-02-16 NOTE — Telephone Encounter (Signed)
Reviewed

## 2020-02-16 NOTE — Telephone Encounter (Signed)
Transition Care Management Follow-up Telephone Call  Date of discharge and from where: 02/13/20 from [facility]  How have you been since you were released from the hospital? Information received from husband (HIPAA compliant). Patient is doing well. Resting now. Denies complaints. Monitoring Blood Pressure and Blood Sugar 2-3 daily. WNL. Husband to verify with Dialysis if CBC/BMP follow up labs drawn this week.  Any questions or concerns? Yes. Tylenol was stopped by the hospitalist. What medication can be taken instead?   Husband notes patient was told to stop gabapentin 100mg  at bedtime. Nurse read medications to take and gabapentin is on the scheduled medication list. Patient to resume taking gabapentin today.   Items Reviewed:  Did the pt receive and understand the discharge instructions provided? Yes , increase activity as tolerated. Monitor BP and BS.   Medications obtained and verified? Yes   Other? No  Any new allergies since your discharge? No  Dietary orders reviewed? Renal  Do you have support at home? Yes  Home Care and Equipment/Supplies: Were home health services ordered? No IWere any new equipment or medical supplies ordered?  No  Functional Questionnaire: (I = Independent and D = Dependent) ADLs: husband assist  Bathing/Dressing- husband assist  Meal Prep- husband assist  Eating- i  Maintaining continence- i  Transferring/Ambulation- wheelchair  Managing Meds- husband assist  Follow up appointments reviewed:   PCP Hospital f/u appt confirmed? Yes  Scheduled to see Dr. Caryl Bis on 02/24/20 @ 4:00 via virtual.  [facility] f/u appt confirmed? Cardiology to be scheduled by husband today. Number provided.   Keep routine dialysis M/W/F.  Are transportation arrangements needed? No   If their condition worsens, is the pt aware to call PCP or go to the Emergency Dept.? Yes  Was the patient provided with contact information for the PCP's office or ED?  Yes  Was to pt encouraged to call back with questions or concerns? Yes

## 2020-02-17 ENCOUNTER — Telehealth: Payer: Self-pay | Admitting: Family Medicine

## 2020-02-17 NOTE — Telephone Encounter (Signed)
[name] with Home Health call and wanted to discuss his evaluation of pt today  He wanted to go over two different plans of action  1- rehab  2- hospice   Jamie Arias said that with home health she has made no progress in the last 6-7 months. She relies on the care of her husband. She only had her eyes open for about a minute today while he was there and there has been a drastic decline and is becoming a failure to thrive. She cannot stand or sit up.   He wants to know which you feel is good for her  Liel Rudden,cma

## 2020-02-17 NOTE — Telephone Encounter (Signed)
Jamie Arias with Home Health call and wanted to discuss his evaluation of pt today  He wanted to go over two different plans of action  1- rehab  2- hospice   Jamie Arias said that with home health she has made no progress in the last 6-7 months. She relies on the care of her husband. She only had her eyes open for about a minute today while he was there and there has been a drastic decline and is becoming a failure to thrive    Please call Jamie Arias at [phone]

## 2020-02-18 NOTE — Telephone Encounter (Signed)
It sounds like hospice or palliative care may be a good option. We would need to discuss with her and her husband for this referral to be placed. Please check with the home health provider to see if this has been discussed with the patient already. They may also prefer SNF placement which could also be appropriate for her.

## 2020-02-18 NOTE — Telephone Encounter (Signed)
I called and spoke with Jamie Arias the rehab person and he stated that the patient seemed to him as a person that has failure to thrive because they have been working with her for 7 months and when he arrives for the appointment she is not even ready for them or dressed for them.  He stated they mentioned to the husband that she needed to be in a facility the SNF and the husband stated she would not want to do that,  he stated that the husband does everything for the wife she does not even sit up.  He said she really needed hospice or palliative care and maybe you have a virtual visit and discuss it with them.  Jamie Arias,cma

## 2020-02-19 DIAGNOSIS — E1122 Type 2 diabetes mellitus with diabetic chronic kidney disease: Secondary | ICD-10-CM | POA: Diagnosis not present

## 2020-02-19 DIAGNOSIS — N2581 Secondary hyperparathyroidism of renal origin: Secondary | ICD-10-CM | POA: Diagnosis not present

## 2020-02-19 DIAGNOSIS — N186 End stage renal disease: Secondary | ICD-10-CM | POA: Diagnosis not present

## 2020-02-19 DIAGNOSIS — Z992 Dependence on renal dialysis: Secondary | ICD-10-CM | POA: Diagnosis not present

## 2020-02-19 DIAGNOSIS — E039 Hypothyroidism, unspecified: Secondary | ICD-10-CM | POA: Diagnosis not present

## 2020-02-19 DIAGNOSIS — D631 Anemia in chronic kidney disease: Secondary | ICD-10-CM | POA: Diagnosis not present

## 2020-02-19 NOTE — Telephone Encounter (Signed)
I can discuss this further during her visit next week.

## 2020-02-22 ENCOUNTER — Other Ambulatory Visit: Payer: Self-pay | Admitting: Family Medicine

## 2020-02-22 DIAGNOSIS — E039 Hypothyroidism, unspecified: Secondary | ICD-10-CM | POA: Diagnosis not present

## 2020-02-22 DIAGNOSIS — D631 Anemia in chronic kidney disease: Secondary | ICD-10-CM | POA: Diagnosis not present

## 2020-02-22 DIAGNOSIS — N2581 Secondary hyperparathyroidism of renal origin: Secondary | ICD-10-CM | POA: Diagnosis not present

## 2020-02-22 DIAGNOSIS — N186 End stage renal disease: Secondary | ICD-10-CM | POA: Diagnosis not present

## 2020-02-22 DIAGNOSIS — E1122 Type 2 diabetes mellitus with diabetic chronic kidney disease: Secondary | ICD-10-CM | POA: Diagnosis not present

## 2020-02-22 DIAGNOSIS — Z992 Dependence on renal dialysis: Secondary | ICD-10-CM | POA: Diagnosis not present

## 2020-02-24 ENCOUNTER — Telehealth (INDEPENDENT_AMBULATORY_CARE_PROVIDER_SITE_OTHER): Payer: Medicare Other | Admitting: Family Medicine

## 2020-02-24 ENCOUNTER — Other Ambulatory Visit: Payer: Self-pay

## 2020-02-24 ENCOUNTER — Encounter: Payer: Self-pay | Admitting: Family Medicine

## 2020-02-24 DIAGNOSIS — R627 Adult failure to thrive: Secondary | ICD-10-CM | POA: Insufficient documentation

## 2020-02-24 NOTE — Progress Notes (Signed)
Virtual Visit via telephone Note  This visit type was conducted due to national recommendations for restrictions regarding the COVID-19 pandemic (e.g. social distancing).  This format is felt to be most appropriate for this patient at this time.  All issues noted in this document were discussed and addressed.  No physical exam was performed (except for noted visual exam findings with Video Visits).   I connected with [name] today at  4:00 PM EST by a video enabled telemedicine application or telephone and verified that I am speaking with the correct person using two identifiers. Location patient: home Location provider: work Persons participating in the virtual visit: patient, provider, Rhia Blatchford (husband)  I discussed the limitations, risks, security and privacy concerns of performing an evaluation and management service by telephone and the availability of in person appointments. I also discussed with the patient that there may be a patient responsible charge related to this service. The patient expressed understanding and agreed to proceed.  Interactive audio and video telecommunications were attempted between this provider and patient, however failed, due to patient having technical difficulties OR patient did not have access to video capability.  We continued and completed visit with audio only.   Reason for visit: f/u.  HPI: Declined in health: We previously received a call from home health noted that the patient seems to be declining and needs criteria for failure to thrive and might benefit from a palliative care or hospice referral.  I spoke with the patient's husband today regarding this.  The patient was present as well.  Patient has not been able to walk since having had Covid in July 2020.  She does not sleep excessively.  She does sit up at times.  She uses a wheelchair to get around.  She does go to dialysis 2 days a week.  Some days she eats better than others.  Her  husband assists with bathing, dressing, going to the bathroom, and cooking.  Occasionally the patient will feed herself.  She does not manage finances.   ROS: See pertinent positives and negatives per HPI.  Past Medical History:  Diagnosis Date  . Anemia   . Arm DVT (deep venous thromboembolism), acute, left ([place]) 02/13/2019  . Arthritis    upper back and neck   . Chronic diastolic CHF (congestive heart failure) ([place])    a. Due to ischemic cardiomyopathy. EF as low as 35%, improved to normal s/p CABG; b. echo 07/06/13: EF 55-60%, no RWMAs, mod TR, trivial pericardial effusion not c/w tamponade physiology;  c. 10/2015 Echo: EF 65%, Gr1 DD, triv AI, mild MR, mildly dil LA, mod TR, PASP 19mmHg.  Marland Kitchen Coronary artery disease    a. NSTEMI 06/2013; b.cath: severe three-vessel CAD w/ EF 30% & mild-mod MR; c. s/p 3 vessel CABG 07/02/13 (LIMA-LAD, SVG-OM, and SVG-RPDA);  d. 10/2015 MV: no ischemia/infarct.  . Diabetes mellitus without complication ([place])   . Diabetic neuropathy ([place])   . ESRD (end stage renal disease) ([place])    a. 12/2015 initiated - mwf dialysis.  Marland Kitchen GERD (gastroesophageal reflux disease)   . Hyperlipidemia   . Hypertension   . Hypothyroidism   . Myocardial infarction ([place]) 2014  . Pleural effusion 2015  . Pulmonary hypertension ([place])   . Wears dentures    full lower    Past Surgical History:  Procedure Laterality Date  . A/V FISTULAGRAM Right 12/17/2016   Procedure: A/V Fistulagram;  Surgeon: Algernon Huxley, MD;  Location: [facility] INVASIVE CV  LAB;  Service: Cardiovascular;  Laterality: Right;  . A/V FISTULAGRAM Right 01/07/2017   Procedure: A/V Fistulagram;  Surgeon: Algernon Huxley, MD;  Location: [place] CV LAB;  Service: Cardiovascular;  Laterality: Right;  . A/V FISTULAGRAM Right 12/03/2017   Procedure: A/V FISTULAGRAM;  Surgeon: Katha Cabal, MD;  Location: [place] CV LAB;  Service: Cardiovascular;  Laterality: Right;  . A/V FISTULAGRAM Right 02/23/2019   Procedure:  A/V Fistulagram;  Surgeon: Algernon Huxley, MD;  Location: [place] CV LAB;  Service: Cardiovascular;  Laterality: Right;  . A/V SHUNT INTERVENTION N/A 02/22/2017   Procedure: A/V SHUNT INTERVENTION;  Surgeon: Algernon Huxley, MD;  Location: [place] CV LAB;  Service: Cardiovascular;  Laterality: N/A;  . A/V SHUNTOGRAM Left 02/04/2020   Procedure: A/V SHUNTOGRAM, POSSIBLE PERMCATH PLACEMENT;  Surgeon: Algernon Huxley, MD;  Location: [place] CV LAB;  Service: Cardiovascular;  Laterality: Left;  . AV FISTULA PLACEMENT Right 02/03/2016   Procedure: INSERTION OF ARTERIOVENOUS (AV) GORE-TEX GRAFT ARM ( BRACH / AXILLARY );  Surgeon: Katha Cabal, MD;  Location: [facility] ORS;  Service: Vascular;  Laterality: Right;  . AV FISTULA PLACEMENT Left 07/09/2019   Procedure: INSERTION OF ARTERIOVENOUS (AV) GORE-TEX GRAFT ARM ( BRACHIAL AXILLARY );  Surgeon: Algernon Huxley, MD;  Location: [facility] ORS;  Service: Vascular;  Laterality: Left;  . [place] REMOVAL Right 03/04/2019   Procedure: REMOVAL OF ARTERIOVENOUS GORETEX GRAFT ([place]);  Surgeon: Algernon Huxley, MD;  Location: [facility] ORS;  Service: Vascular;  Laterality: Right;  . CARDIAC CATHETERIZATION    . CATARACT EXTRACTION Bilateral   . CHOLECYSTECTOMY N/A 12/09/2014   Procedure: LAPAROSCOPIC CHOLECYSTECTOMY;  Surgeon: Marlyce [name], MD;  Location: [facility] ORS;  Service: General;  Laterality: N/A;  . CORONARY ARTERY BYPASS GRAFT N/A 07/02/2013   Procedure: CORONARY ARTERY BYPASS GRAFTING (CABG);  Surgeon: Ivin [name], MD;  Location: [place];  Service: Open Heart Surgery;  Laterality: N/A;  CABG x three, using left internal mammary artery and right leg greater saphenous vein harvested endoscopically  . DIALYSIS/PERMA CATHETER INSERTION N/A 02/05/2020   Procedure: DIALYSIS/PERMA CATHETER INSERTION;  Surgeon: Algernon Huxley, MD;  Location: [place] CV LAB;  Service: Cardiovascular;  Laterality: N/A;  . DIALYSIS/PERMA CATHETER REMOVAL N/A 10/13/2019   Procedure:  DIALYSIS/PERMA CATHETER REMOVAL;  Surgeon: Algernon Huxley, MD;  Location: [place] CV LAB;  Service: Cardiovascular;  Laterality: N/A;  . ESOPHAGOGASTRODUODENOSCOPY N/A 04/11/2019   Procedure: ESOPHAGOGASTRODUODENOSCOPY (EGD);  Surgeon: Jonathon Bellows, MD;  Location: [facility] ENDOSCOPY;  Service: Gastroenterology;  Laterality: N/A;  . ESOPHAGOGASTRODUODENOSCOPY (EGD) WITH PROPOFOL N/A 11/24/2015   Procedure: ESOPHAGOGASTRODUODENOSCOPY (EGD) WITH PROPOFOL;  Surgeon: [name], MD;  Location: [place];  Service: Endoscopy;  Laterality: N/A;  Diabetic - insulin  . ESOPHAGOGASTRODUODENOSCOPY (EGD) WITH PROPOFOL N/A 10/10/2019   Procedure: ESOPHAGOGASTRODUODENOSCOPY (EGD) WITH PROPOFOL;  Surgeon: Virgel Manifold, MD;  Location: [facility] ENDOSCOPY;  Service: Endoscopy;  Laterality: N/A;  . EYE SURGERY Bilateral    Cataract Extraction with IOL  . INTRAOPERATIVE TRANSESOPHAGEAL ECHOCARDIOGRAM N/A 07/02/2013   Procedure: INTRAOPERATIVE TRANSESOPHAGEAL ECHOCARDIOGRAM;  Surgeon: Ivin [name], MD;  Location: [place];  Service: Open Heart Surgery;  Laterality: N/A;  . PEG PLACEMENT N/A 03/03/2019   Procedure: PERCUTANEOUS ENDOSCOPIC GASTROSTOMY (PEG) PLACEMENT;  Surgeon: Virgel Manifold, MD;  Location: [facility] ENDOSCOPY;  Service: Endoscopy;  Laterality: N/A;  . PERIPHERAL VASCULAR CATHETERIZATION Right 12/06/2015   Procedure: Dialysis/Perma Catheter Insertion;  Surgeon: Katha Cabal, MD;  Location:  [place] CV LAB;  Service: Cardiovascular;  Laterality: Right;  . PERIPHERAL VASCULAR THROMBECTOMY Right 09/28/2016   Procedure: Peripheral Vascular Thrombectomy;  Surgeon: Katha Cabal, MD;  Location: [place] CV LAB;  Service: Cardiovascular;  Laterality: Right;  . PERIPHERAL VASCULAR THROMBECTOMY Right 05/20/2018   Procedure: PERIPHERAL VASCULAR THROMBECTOMY;  Surgeon: Katha Cabal, MD;  Location: [place] CV LAB;  Service: Cardiovascular;  Laterality: Right;  . PORTA CATH  REMOVAL N/A 06/01/2016   Procedure: Glori [name] Cath Removal;  Surgeon: Katha Cabal, MD;  Location: [place] CV LAB;  Service: Cardiovascular;  Laterality: N/A;  . THORACENTESIS Left 2015    Family History  Problem Relation Age of Onset  . COPD Mother   . Cancer Mother        Lung  . Pulmonary embolism Father   . Diabetes Father   . Diabetes Paternal Grandfather   . Heart disease Maternal Grandmother   . Colon cancer Neg Hx   . Colon polyps Neg Hx   . Esophageal cancer Neg Hx   . Pancreatic cancer Neg Hx   . Liver disease Neg Hx     SOCIAL HX: Non-smoker   Current Outpatient Medications:  .  amiodarone (PACERONE) 100 MG tablet, Take 1 tablet (100 mg total) by mouth daily., Disp: 30 tablet, Rfl: 0 .  atorvastatin (LIPITOR) 40 MG tablet, Take 1 tablet (40 mg total) by mouth daily., Disp: 90 tablet, Rfl: 1 .  FLUoxetine (PROZAC) 20 MG capsule, Take 1 capsule by mouth once daily, Disp: 90 capsule, Rfl: 0 .  fluticasone (FLONASE) 50 MCG/ACT nasal spray, Place 2 sprays into both nostrils daily. (Patient taking differently: Place 2 sprays into both nostrils daily as needed for allergies or rhinitis.), Disp: 16 g, Rfl: 6 .  folic acid (FOLVITE) 1 MG tablet, Take 1 tablet (1 mg total) by mouth daily., Disp:  , Rfl:  .  gabapentin (NEURONTIN) 100 MG capsule, Take 1 capsule (100 mg total) by mouth at bedtime., Disp: 30 capsule, Rfl: 0 .  hydrALAZINE (APRESOLINE) 25 MG tablet, Take 1 tablet (25 mg total) by mouth every 8 (eight) hours., Disp: 90 tablet, Rfl: 0 .  hydrocortisone cream 1 %, Apply topically as needed for itching., Disp: 30 g, Rfl: 0 .  levothyroxine (SYNTHROID) 88 MCG tablet, Take 1 tablet (88 mcg total) by mouth daily before breakfast., Disp: 90 tablet, Rfl: 1 .  lidocaine-prilocaine (EMLA) cream, Apply 1 application topically as needed., Disp: , Rfl:  .  Melatonin 3 MG TABS, Take 3 mg by mouth at bedtime as needed (sleep). , Disp: , Rfl:  .  multivitamin (RENA-VIT) TABS  tablet, Take 1 tablet by mouth at bedtime., Disp: , Rfl: 0 .  nitroGLYCERIN (NITROSTAT) 0.4 MG SL tablet, DISSOLVE ONE TABLET UNDER THE TONGUE EVERY 5 MINUTES AS NEEDED FOR CHEST PAIN.  DO NOT EXCEED A TOTAL OF 3 DOSES IN 15 MINUTES (Patient taking differently: Place 0.4 mg under the tongue every 5 (five) minutes as needed for chest pain. DISSOLVE ONE TABLET UNDER THE TONGUE EVERY 5 MINUTES AS NEEDED FOR CHEST PAIN.  DO NOT EXCEED A TOTAL OF 3 DOSES IN 15 MINUTES), Disp: 25 tablet, Rfl: 0 .  Nutritional Supplements (FEEDING SUPPLEMENT, NEPRO CARB STEADY,) LIQD, Take 237 mLs by mouth 2 (two) times daily between meals., Disp: 237 mL, Rfl: 0 .  omeprazole (PRILOSEC) 20 MG capsule, Take 20 mg by mouth daily., Disp: , Rfl:  .  ondansetron (ZOFRAN-ODT) 4 MG disintegrating  tablet, DISSOLVE 1 TABLET IN MOUTH EVERY 8 HOURS AS NEEDED FOR NAUSEA AND VOMITING. PATIENT NEEDS APPOINTMENT WITH PCP TO RECEIVE FURTHER REFILLS, Disp: 30 tablet, Rfl: 0 .  traZODone (DESYREL) 50 MG tablet, TAKE 1 TABLET BY MOUTH AT BEDTIME .  NEED  APPOINTMENT  FOR  FUTURE  REFILLS., Disp: 30 tablet, Rfl: 0 .  vitamin C (VITAMIN C) 500 MG tablet, Take 1 tablet (500 mg total) by mouth daily., Disp:  , Rfl:  .  VITAMIN D, CHOLECALCIFEROL, PO, Take 1 capsule by mouth once a week., Disp: , Rfl:   EXAM: This was a telephone visit and thus no physical exam was completed.  ASSESSMENT AND PLAN:  Discussed the following assessment and plan:  Problem List Items Addressed This Visit    Failure to thrive in adult    Patient with possible failure to thrive based on home health report.  She has had declining health likely related to chronic medical issues and having had COVID-19 last year and is unable to get out of the house consistently due to mobility issues.  She is on dialysis.  I discussed a palliative care referral to see if they have additional resources to provide to the patient.  Home health continues to follow with the patient as well  and physical therapy has been working with the patient for months without any improvement.      Relevant Orders   Amb Referral to Palliative Care       I discussed the assessment and treatment plan with the patient. The patient was provided an opportunity to ask questions and all were answered. The patient agreed with the plan and demonstrated an understanding of the instructions.   The patient was advised to call back or seek an in-person evaluation if the symptoms worsen or if the condition fails to improve as anticipated.  I provided 9 minutes of non-face-to-face time during this encounter.   [name], MD

## 2020-02-24 NOTE — Assessment & Plan Note (Addendum)
Patient with possible failure to thrive based on home health report.  She has had declining health likely related to chronic medical issues and having had COVID-19 last year and is unable to get out of the house consistently due to mobility issues.  She is on dialysis.  I discussed a palliative care referral to see if they have additional resources to provide to the patient.  Home health continues to follow with the patient as well and physical therapy has been working with the patient for months without any improvement.

## 2020-02-29 ENCOUNTER — Telehealth: Payer: Self-pay | Admitting: Family Medicine

## 2020-02-29 DIAGNOSIS — E039 Hypothyroidism, unspecified: Secondary | ICD-10-CM | POA: Diagnosis not present

## 2020-02-29 DIAGNOSIS — E1122 Type 2 diabetes mellitus with diabetic chronic kidney disease: Secondary | ICD-10-CM | POA: Diagnosis not present

## 2020-02-29 DIAGNOSIS — U071 COVID-19: Secondary | ICD-10-CM | POA: Diagnosis not present

## 2020-02-29 DIAGNOSIS — N186 End stage renal disease: Secondary | ICD-10-CM | POA: Diagnosis not present

## 2020-02-29 DIAGNOSIS — D631 Anemia in chronic kidney disease: Secondary | ICD-10-CM | POA: Diagnosis not present

## 2020-02-29 DIAGNOSIS — N2581 Secondary hyperparathyroidism of renal origin: Secondary | ICD-10-CM | POA: Diagnosis not present

## 2020-02-29 DIAGNOSIS — R2689 Other abnormalities of gait and mobility: Secondary | ICD-10-CM | POA: Diagnosis not present

## 2020-02-29 DIAGNOSIS — Z992 Dependence on renal dialysis: Secondary | ICD-10-CM | POA: Diagnosis not present

## 2020-02-29 NOTE — Telephone Encounter (Signed)
Left message for patient to call back and schedule Medicare Annual Wellness Visit (AWV)  ° °This should be a telephone visit only=30 minutes. ° °No hx of AWV; please schedule at anytime with Denisa O'Brien-Blaney at [facility] [place] ° ° °

## 2020-03-01 ENCOUNTER — Telehealth: Payer: Self-pay | Admitting: Adult Health Nurse Practitioner

## 2020-03-01 NOTE — Telephone Encounter (Signed)
Called patient's husband to offer to schedule a Palliative f/u visit with NP, no answer - left message with reason for call long with my name and call back number.

## 2020-03-01 NOTE — Telephone Encounter (Signed)
Returned call to husband and have scheduled a Telephone Palliative f/u visit for 03/02/20 @ 3 PM.

## 2020-03-02 ENCOUNTER — Telehealth: Payer: Self-pay | Admitting: Adult Health Nurse Practitioner

## 2020-03-02 ENCOUNTER — Other Ambulatory Visit: Payer: Self-pay

## 2020-03-02 ENCOUNTER — Other Ambulatory Visit: Payer: Medicare Other | Admitting: Adult Health Nurse Practitioner

## 2020-03-02 DIAGNOSIS — N186 End stage renal disease: Secondary | ICD-10-CM | POA: Diagnosis not present

## 2020-03-02 DIAGNOSIS — R110 Nausea: Secondary | ICD-10-CM | POA: Diagnosis not present

## 2020-03-02 DIAGNOSIS — M159 Polyosteoarthritis, unspecified: Secondary | ICD-10-CM | POA: Diagnosis not present

## 2020-03-02 DIAGNOSIS — R5381 Other malaise: Secondary | ICD-10-CM | POA: Diagnosis not present

## 2020-03-02 DIAGNOSIS — Z515 Encounter for palliative care: Secondary | ICD-10-CM | POA: Diagnosis not present

## 2020-03-02 NOTE — Progress Notes (Signed)
[facility] Consult Note Telephone: [phone]  Fax: [phone]  PATIENT NAME: Jamie Arias DOB: Jun 20, 1955 MRN: [id number]  PRIMARY CARE PROVIDER:   [name], MD  REFERRING PROVIDER:  [name], MD [address] [place],  [place] [address]  RESPONSIBLE PARTY:     ASSESSMENT:        RECOMMENDATIONS and PLAN:  1.  Advanced care planning.  Patient is full code.  Started going over MOST form and husband states that she wants limited hospital interventions. Will go over in more detail at next appointment in person.   2.  ESRD.  Patient does better having dialysis 2x per week on Mon and Fri. Husband states that she is working with nephrology to see if home dialysis is an option for her.  Though she was hospitalized for missed dialysis treatments she still wants to continue with dialysis at this time.   3.  Debility.  Patient has had significant functional decline since having COVID June of 2020 but her functional status has been unchanged for past several months per husband.  Continue supportive care at home  4. Nausea.  This does not happen every day and she gets relief with zofran.  Continue zofran PRN for nausea.  5.  Osteoarthritis. Gets relief with Icy/Hot cream.  Continue OTC creams that give her relief.  Palliative care will continue to monitor for symptom management/decline and make recommendations as needed.  Next appointment in in 4 weeks.  Encouraged to call with any questions or concerns   HISTORY OF PRESENT ILLNESS:  Jamie Arias is a 64 y.o. year old female with multiple medical problems including ESRD on HD (M,F), HTN, HLD, DMT2,a-fib, CAD, dCHF, iron deficiency anemia. Palliative Care was asked to help address goals of care. Reviewed medical records and labs since last visit. Most of history obtained from her husband.  Patient had hospitalization 11/30-12/11/21 for AMS due to missing 5  dialysis treatments.  Husband states that missed treatments were due to her not feeling well.  She does get dizziness sometimes with position changes; this does not happen often and sometimes she gets nausea with it.  She does get relief with zofran. Her appetite is about the same and she supplements with Nepro.  No reported weight loss.  Patient has pain in left hand and arm related to arthritis and this relieved with Icy/Hot cream.  Rest of 10 point ROS asked and negative.    CODE STATUS: full code  PPS: 30% HOSPICE ELIGIBILITY/DIAGNOSIS: TBD  PHYSICAL EXAM:   Deferred   PAST MEDICAL HISTORY:  Past Medical History:  Diagnosis Date  . Anemia   . Arm DVT (deep venous thromboembolism), acute, left ([place]) 02/13/2019  . Arthritis    upper back and neck   . Chronic diastolic CHF (congestive heart failure) ([place])    a. Due to ischemic cardiomyopathy. EF as low as 35%, improved to normal s/p CABG; b. echo 07/06/13: EF 55-60%, no RWMAs, mod TR, trivial pericardial effusion not c/w tamponade physiology;  c. 10/2015 Echo: EF 65%, Gr1 DD, triv AI, mild MR, mildly dil LA, mod TR, PASP 44mmHg.  Marland Kitchen Coronary artery disease    a. NSTEMI 06/2013; b.cath: severe three-vessel CAD w/ EF 30% & mild-mod MR; c. s/p 3 vessel CABG 07/02/13 (LIMA-LAD, SVG-OM, and SVG-RPDA);  d. 10/2015 MV: no ischemia/infarct.  . Diabetes mellitus without complication ([place])   . Diabetic neuropathy ([place])   . ESRD (  end stage renal disease) ([place])    a. 12/2015 initiated - mwf dialysis.  Marland Kitchen GERD (gastroesophageal reflux disease)   . Hyperlipidemia   . Hypertension   . Hypothyroidism   . Myocardial infarction ([place]) 2014  . Pleural effusion 2015  . Pulmonary hypertension ([place])   . Wears dentures    full lower    SOCIAL HX:  Social History   Tobacco Use  . Smoking status: Never Smoker  . Smokeless tobacco: Never Used  Substance Use Topics  . Alcohol use: No    Alcohol/week: 0.0 standard drinks    ALLERGIES:  Allergies   Allergen Reactions  . Nsaids Other (See Comments)    Contraindicated due to kidney disease.  Judeth Cornfield Reductase Inhibitors   . Doxycycline Other (See Comments)    tremor     PERTINENT MEDICATIONS:  Outpatient Encounter Medications as of 03/02/2020  Medication Sig  . amiodarone (PACERONE) 100 MG tablet Take 1 tablet (100 mg total) by mouth daily.  Marland Kitchen atorvastatin (LIPITOR) 40 MG tablet Take 1 tablet (40 mg total) by mouth daily.  Marland Kitchen FLUoxetine (PROZAC) 20 MG capsule Take 1 capsule by mouth once daily  . fluticasone (FLONASE) 50 MCG/ACT nasal spray Place 2 sprays into both nostrils daily. (Patient taking differently: Place 2 sprays into both nostrils daily as needed for allergies or rhinitis.)  . folic acid (FOLVITE) 1 MG tablet Take 1 tablet (1 mg total) by mouth daily.  Marland Kitchen gabapentin (NEURONTIN) 100 MG capsule Take 1 capsule (100 mg total) by mouth at bedtime.  . hydrALAZINE (APRESOLINE) 25 MG tablet Take 1 tablet (25 mg total) by mouth every 8 (eight) hours.  . hydrocortisone cream 1 % Apply topically as needed for itching.  . levothyroxine (SYNTHROID) 88 MCG tablet Take 1 tablet (88 mcg total) by mouth daily before breakfast.  . lidocaine-prilocaine (EMLA) cream Apply 1 application topically as needed.  . Melatonin 3 MG TABS Take 3 mg by mouth at bedtime as needed (sleep).   . multivitamin (RENA-VIT) TABS tablet Take 1 tablet by mouth at bedtime.  . nitroGLYCERIN (NITROSTAT) 0.4 MG SL tablet DISSOLVE ONE TABLET UNDER THE TONGUE EVERY 5 MINUTES AS NEEDED FOR CHEST PAIN.  DO NOT EXCEED A TOTAL OF 3 DOSES IN 15 MINUTES (Patient taking differently: Place 0.4 mg under the tongue every 5 (five) minutes as needed for chest pain. DISSOLVE ONE TABLET UNDER THE TONGUE EVERY 5 MINUTES AS NEEDED FOR CHEST PAIN.  DO NOT EXCEED A TOTAL OF 3 DOSES IN 15 MINUTES)  . Nutritional Supplements (FEEDING SUPPLEMENT, NEPRO CARB STEADY,) LIQD Take 237 mLs by mouth 2 (two) times daily between meals.  Marland Kitchen omeprazole  (PRILOSEC) 20 MG capsule Take 20 mg by mouth daily.  . ondansetron (ZOFRAN-ODT) 4 MG disintegrating tablet DISSOLVE 1 TABLET IN MOUTH EVERY 8 HOURS AS NEEDED FOR NAUSEA AND VOMITING. PATIENT NEEDS APPOINTMENT WITH PCP TO RECEIVE FURTHER REFILLS  . traZODone (DESYREL) 50 MG tablet TAKE 1 TABLET BY MOUTH AT BEDTIME .  NEED  APPOINTMENT  FOR  FUTURE  REFILLS.  Marland Kitchen vitamin C (VITAMIN C) 500 MG tablet Take 1 tablet (500 mg total) by mouth daily.  Marland Kitchen VITAMIN D, CHOLECALCIFEROL, PO Take 1 capsule by mouth once a week.   No facility-administered encounter medications on file as of 03/02/2020.      Naveya Ellerman Jenetta Downer, NP

## 2020-03-02 NOTE — Telephone Encounter (Signed)
Called for 3pm telehealth appointment.  Left VM with reason for call and call back info. Jamie Arias K. Olena Heckle NP

## 2020-03-04 DIAGNOSIS — N186 End stage renal disease: Secondary | ICD-10-CM | POA: Diagnosis not present

## 2020-03-04 DIAGNOSIS — Z992 Dependence on renal dialysis: Secondary | ICD-10-CM | POA: Diagnosis not present

## 2020-03-07 ENCOUNTER — Other Ambulatory Visit: Payer: Self-pay | Admitting: Family Medicine

## 2020-03-07 DIAGNOSIS — R52 Pain, unspecified: Secondary | ICD-10-CM | POA: Diagnosis not present

## 2020-03-07 DIAGNOSIS — E039 Hypothyroidism, unspecified: Secondary | ICD-10-CM | POA: Diagnosis not present

## 2020-03-07 DIAGNOSIS — A499 Bacterial infection, unspecified: Secondary | ICD-10-CM | POA: Diagnosis not present

## 2020-03-07 DIAGNOSIS — Z992 Dependence on renal dialysis: Secondary | ICD-10-CM | POA: Diagnosis not present

## 2020-03-07 DIAGNOSIS — N2581 Secondary hyperparathyroidism of renal origin: Secondary | ICD-10-CM | POA: Diagnosis not present

## 2020-03-07 DIAGNOSIS — N186 End stage renal disease: Secondary | ICD-10-CM | POA: Diagnosis not present

## 2020-03-07 DIAGNOSIS — D631 Anemia in chronic kidney disease: Secondary | ICD-10-CM | POA: Diagnosis not present

## 2020-03-07 DIAGNOSIS — D689 Coagulation defect, unspecified: Secondary | ICD-10-CM | POA: Diagnosis not present

## 2020-03-14 DIAGNOSIS — D689 Coagulation defect, unspecified: Secondary | ICD-10-CM | POA: Diagnosis not present

## 2020-03-14 DIAGNOSIS — N186 End stage renal disease: Secondary | ICD-10-CM | POA: Diagnosis not present

## 2020-03-14 DIAGNOSIS — R52 Pain, unspecified: Secondary | ICD-10-CM | POA: Diagnosis not present

## 2020-03-14 DIAGNOSIS — A499 Bacterial infection, unspecified: Secondary | ICD-10-CM | POA: Diagnosis not present

## 2020-03-14 DIAGNOSIS — Z992 Dependence on renal dialysis: Secondary | ICD-10-CM | POA: Diagnosis not present

## 2020-03-14 DIAGNOSIS — N2581 Secondary hyperparathyroidism of renal origin: Secondary | ICD-10-CM | POA: Diagnosis not present

## 2020-03-14 DIAGNOSIS — D631 Anemia in chronic kidney disease: Secondary | ICD-10-CM | POA: Diagnosis not present

## 2020-03-14 DIAGNOSIS — E039 Hypothyroidism, unspecified: Secondary | ICD-10-CM | POA: Diagnosis not present

## 2020-03-15 ENCOUNTER — Other Ambulatory Visit: Payer: Self-pay | Admitting: Family Medicine

## 2020-03-15 ENCOUNTER — Encounter: Payer: Self-pay | Admitting: Family Medicine

## 2020-03-15 ENCOUNTER — Other Ambulatory Visit: Payer: Self-pay

## 2020-03-15 ENCOUNTER — Telehealth (INDEPENDENT_AMBULATORY_CARE_PROVIDER_SITE_OTHER): Payer: Self-pay

## 2020-03-15 MED ORDER — ONDANSETRON 4 MG PO TBDP: 4.0000 mg | ORAL_TABLET | Freq: Three times a day (TID) | ORAL | 0 refills | Status: AC | PRN

## 2020-03-15 NOTE — Telephone Encounter (Signed)
A fax was received from [facility] for the patient to have a permcath removal. Patient was scheduled with Dr. Lucky Cowboy on 01/

## 2020-03-16 ENCOUNTER — Ambulatory Visit: Payer: Medicare Other | Admitting: Physician Assistant

## 2020-03-17 NOTE — Telephone Encounter (Signed)
Patient has been scheduled with Dr. Lucky Cowboy for a permcath removal on 03/24/20 with a 11:15 am arrival time to the [facility]. Covid testing on 03/22/20 between 8-1 pm at the [facility]. Pre-procedure instructions will be faxed to [facility] as requested.

## 2020-03-18 ENCOUNTER — Other Ambulatory Visit: Payer: Self-pay | Admitting: Family Medicine

## 2020-03-22 ENCOUNTER — Other Ambulatory Visit: Payer: Medicare Other | Attending: Vascular Surgery

## 2020-03-23 ENCOUNTER — Other Ambulatory Visit: Payer: Self-pay | Admitting: Family Medicine

## 2020-03-24 ENCOUNTER — Other Ambulatory Visit (INDEPENDENT_AMBULATORY_CARE_PROVIDER_SITE_OTHER): Payer: Self-pay | Admitting: Nurse Practitioner

## 2020-03-24 ENCOUNTER — Encounter: Admission: RE | Payer: Self-pay | Source: Home / Self Care

## 2020-03-24 ENCOUNTER — Ambulatory Visit: Admission: RE | Admit: 2020-03-24 | Payer: Medicare Other | Source: Home / Self Care | Admitting: Vascular Surgery

## 2020-03-24 DIAGNOSIS — N186 End stage renal disease: Secondary | ICD-10-CM

## 2020-03-24 SURGERY — DIALYSIS/PERMA CATHETER REMOVAL
Anesthesia: LOCAL

## 2020-03-25 DIAGNOSIS — N186 End stage renal disease: Secondary | ICD-10-CM | POA: Diagnosis not present

## 2020-03-25 DIAGNOSIS — E039 Hypothyroidism, unspecified: Secondary | ICD-10-CM | POA: Diagnosis not present

## 2020-03-25 DIAGNOSIS — A499 Bacterial infection, unspecified: Secondary | ICD-10-CM | POA: Diagnosis not present

## 2020-03-25 DIAGNOSIS — Z992 Dependence on renal dialysis: Secondary | ICD-10-CM | POA: Diagnosis not present

## 2020-03-25 DIAGNOSIS — D631 Anemia in chronic kidney disease: Secondary | ICD-10-CM | POA: Diagnosis not present

## 2020-03-25 DIAGNOSIS — D689 Coagulation defect, unspecified: Secondary | ICD-10-CM | POA: Diagnosis not present

## 2020-03-25 DIAGNOSIS — R52 Pain, unspecified: Secondary | ICD-10-CM | POA: Diagnosis not present

## 2020-03-25 DIAGNOSIS — N2581 Secondary hyperparathyroidism of renal origin: Secondary | ICD-10-CM | POA: Diagnosis not present

## 2020-03-28 DIAGNOSIS — D631 Anemia in chronic kidney disease: Secondary | ICD-10-CM | POA: Diagnosis not present

## 2020-03-28 DIAGNOSIS — N186 End stage renal disease: Secondary | ICD-10-CM | POA: Diagnosis not present

## 2020-03-28 DIAGNOSIS — R52 Pain, unspecified: Secondary | ICD-10-CM | POA: Diagnosis not present

## 2020-03-28 DIAGNOSIS — N2581 Secondary hyperparathyroidism of renal origin: Secondary | ICD-10-CM | POA: Diagnosis not present

## 2020-03-28 DIAGNOSIS — Z992 Dependence on renal dialysis: Secondary | ICD-10-CM | POA: Diagnosis not present

## 2020-03-28 DIAGNOSIS — D689 Coagulation defect, unspecified: Secondary | ICD-10-CM | POA: Diagnosis not present

## 2020-03-28 DIAGNOSIS — E039 Hypothyroidism, unspecified: Secondary | ICD-10-CM | POA: Diagnosis not present

## 2020-03-28 DIAGNOSIS — A499 Bacterial infection, unspecified: Secondary | ICD-10-CM | POA: Diagnosis not present

## 2020-03-29 ENCOUNTER — Telehealth: Payer: Self-pay | Admitting: Family Medicine

## 2020-03-31 DIAGNOSIS — R2689 Other abnormalities of gait and mobility: Secondary | ICD-10-CM | POA: Diagnosis not present

## 2020-03-31 DIAGNOSIS — U071 COVID-19: Secondary | ICD-10-CM | POA: Diagnosis not present

## 2020-04-04 DIAGNOSIS — N186 End stage renal disease: Secondary | ICD-10-CM | POA: Diagnosis not present

## 2020-04-04 DIAGNOSIS — Z992 Dependence on renal dialysis: Secondary | ICD-10-CM | POA: Diagnosis not present

## 2020-04-05 NOTE — Telephone Encounter (Signed)
I put the death certificate in the sign basket.  Christen Wardrop,cma

## 2020-04-05 NOTE — Telephone Encounter (Signed)
[employer] brought pt's death certificate. Placed in folder up front to be signed

## 2020-04-05 NOTE — Telephone Encounter (Signed)
Noted. Will await death certificate.

## 2020-04-05 NOTE — Telephone Encounter (Signed)
[place] Sheriff called, pt passed away at home.

## 2020-04-05 DEATH — deceased

## 2020-04-06 NOTE — Telephone Encounter (Signed)
I called [employer] and inform 06-25-2022 that the death certificate for the patient was ready for pickup.  Walta [place],cma

## 2020-04-06 NOTE — Telephone Encounter (Signed)
Completed.  Please make available for pickup. 

## 2020-04-28 ENCOUNTER — Ambulatory Visit: Payer: Medicare Other | Admitting: Physician Assistant

## 2021-01-31 IMAGING — DX PORTABLE CHEST - 1 VIEW
1 series · 1 of 1 positions shown · non-contrast
Comparison: 09/04/2017

CLINICAL DATA: Dizziness and weakness.  Dialysis.

EXAM:
PORTABLE CHEST 1 VIEW

[chest ap]
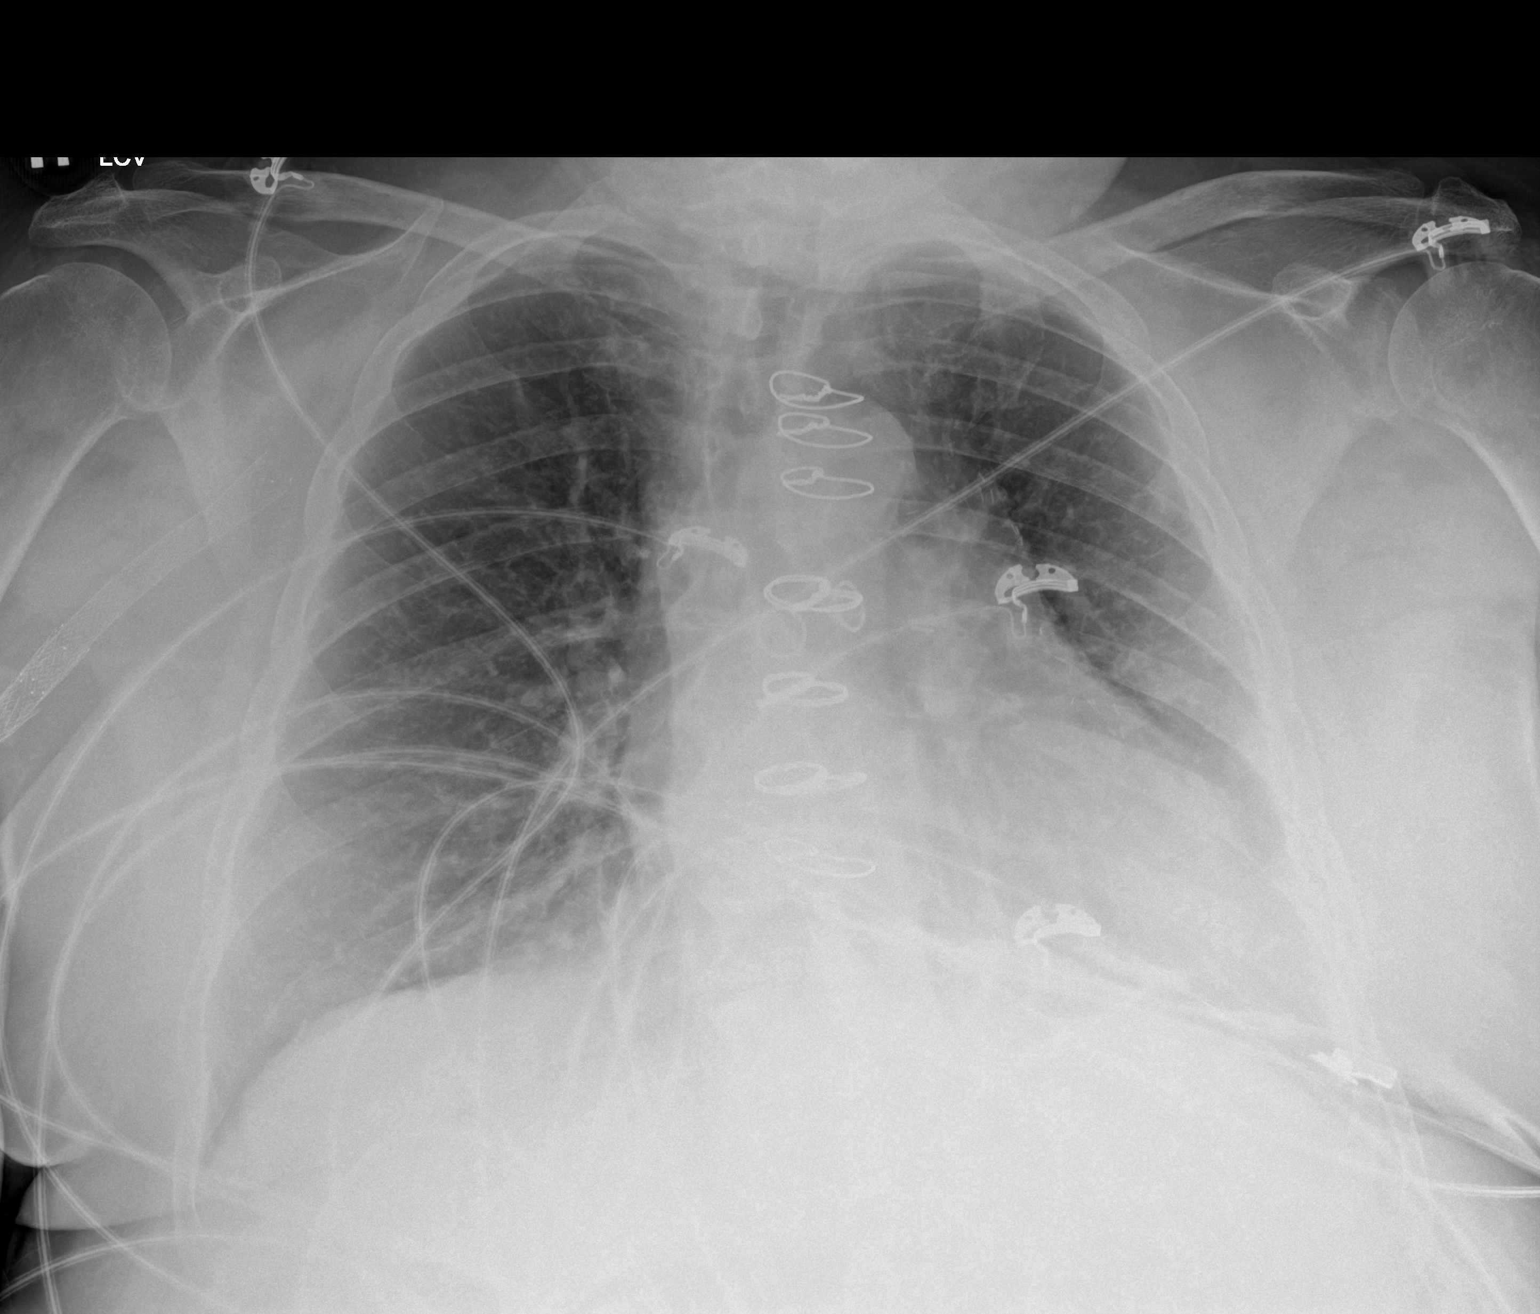

[1 of 1 positions shown; findings below may reference images not displayed]

FINDINGS: Lungs are adequately inflated without lobar consolidation or
effusion. Mild stable cardiomegaly. Remainder of the exam is
unchanged.
IMPRESSION: No acute cardiopulmonary disease.

Mild stable cardiomegaly.

## 2021-09-13 IMAGING — DX DG ABDOMEN 1V
2 series · 2 of 2 positions shown · non-contrast
Comparison: None.

CLINICAL DATA: Nausea.  End-stage renal disease.

EXAM:
ABDOMEN - 1 VIEW

[abdomen supine (1 of 2)]
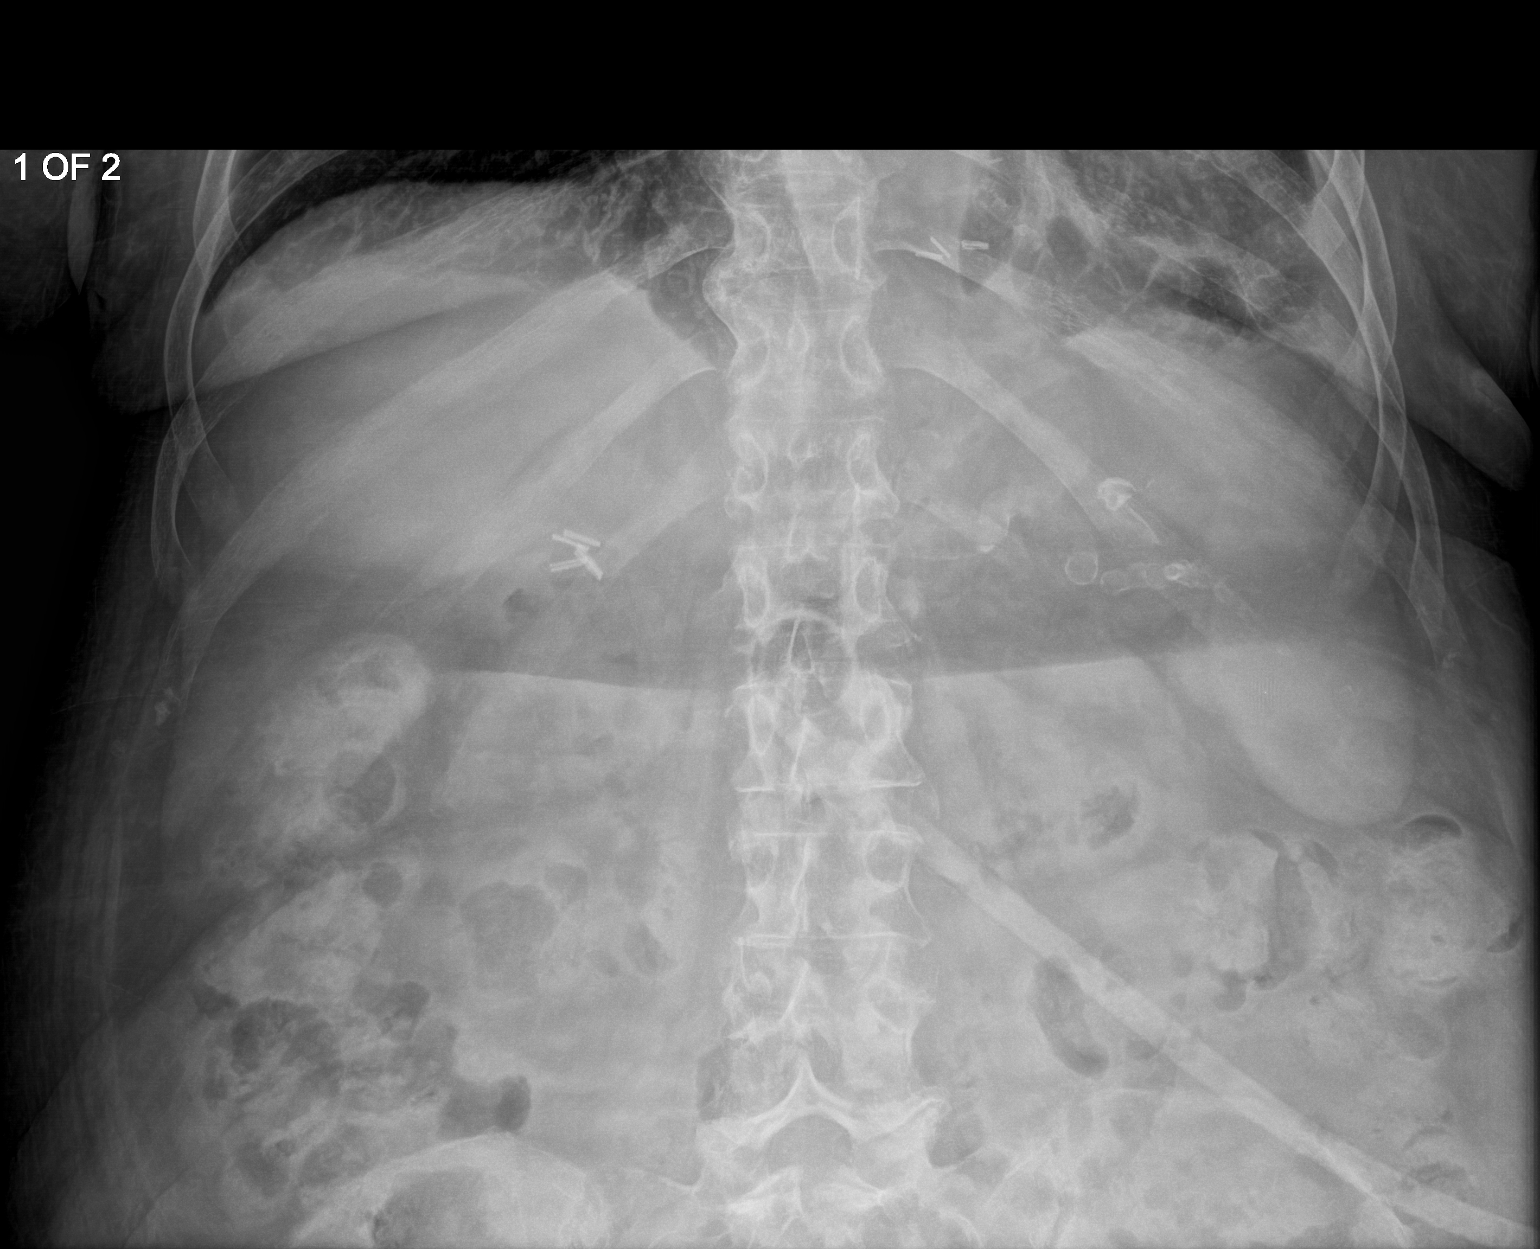

[abdomen supine (2 of 2)]
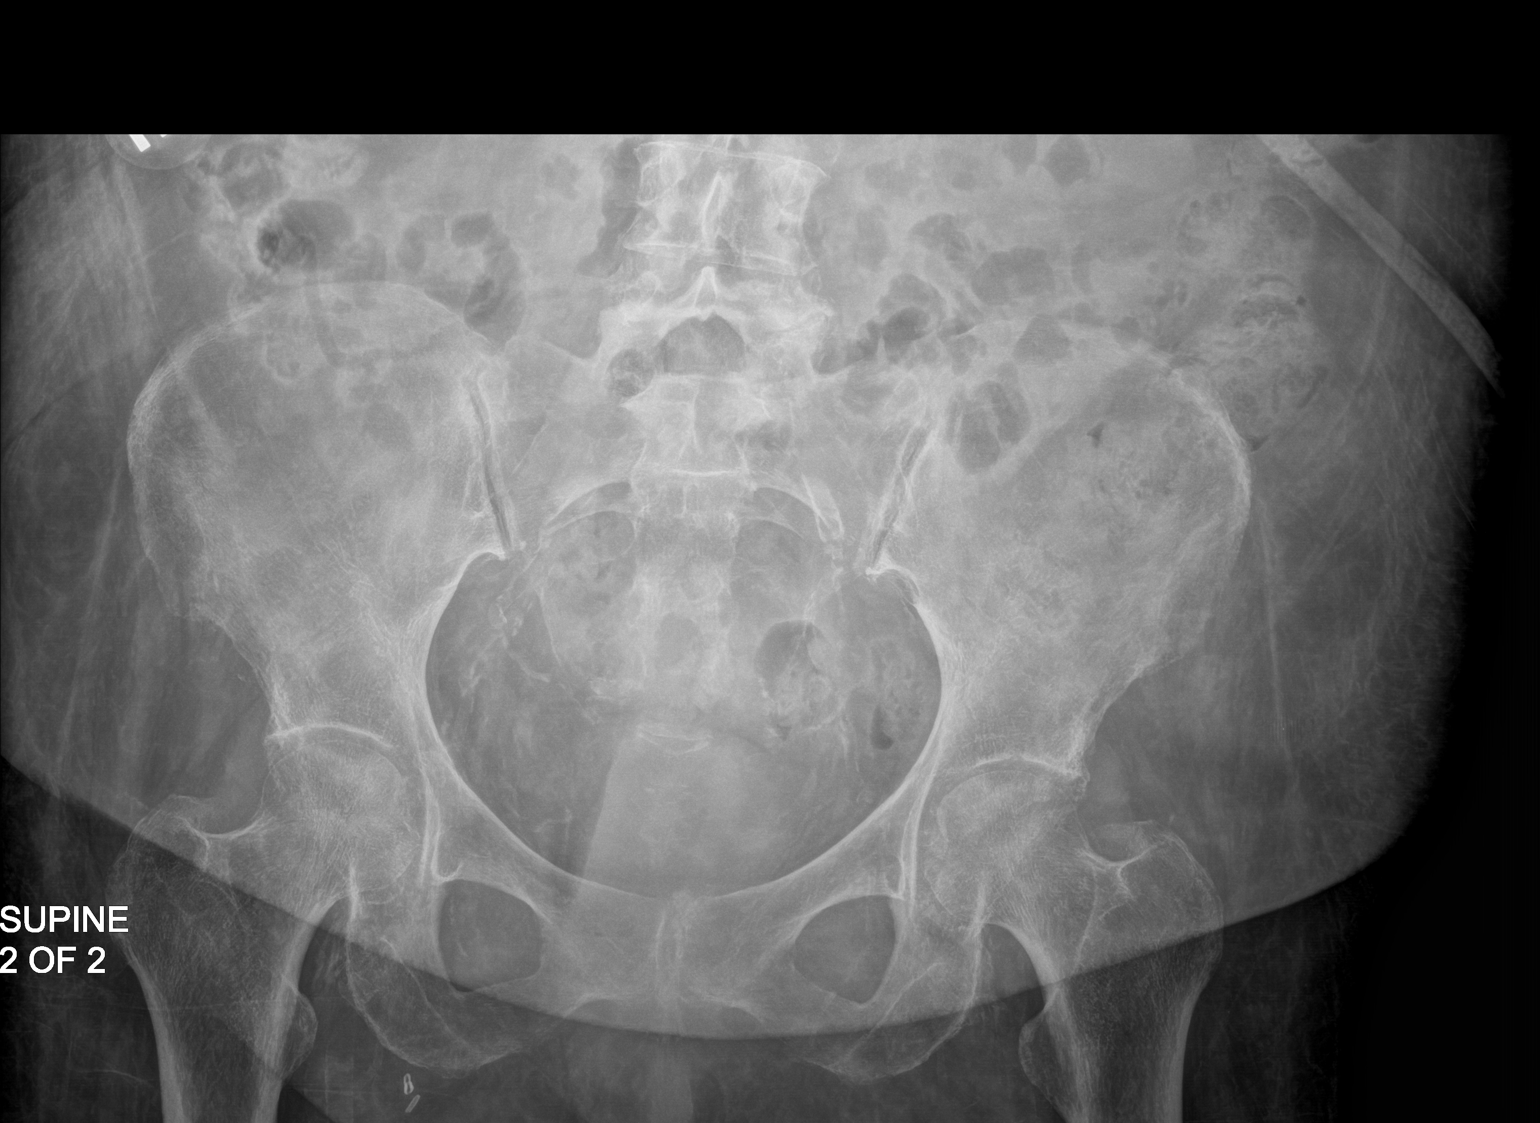

[2 of 2 positions shown; findings below may reference images not displayed]

FINDINGS: Two supine views. Cholecystectomy. Prior median sternotomy.
Cardiomegaly. No gaseous distention of bowel loops. No pneumatosis.
Lucency about the upper abdomen is favored to be within or adjacent
the pannus. Vascular calcifications.
IMPRESSION: No acute findings.
